# Patient Record
Sex: Male | Born: 1950 | Race: White | Hispanic: No | Marital: Married | Smoking: Never smoker
Health system: Southern US, Community
[De-identification: ages and names within clinical notes are randomized; demographics above are authoritative.]

## PROBLEM LIST (undated history)

## (undated) DIAGNOSIS — I10 Essential (primary) hypertension: Secondary | ICD-10-CM

## (undated) DIAGNOSIS — Q288 Other specified congenital malformations of circulatory system: Secondary | ICD-10-CM

## (undated) DIAGNOSIS — Z9689 Presence of other specified functional implants: Secondary | ICD-10-CM

## (undated) DIAGNOSIS — G839 Paralytic syndrome, unspecified: Secondary | ICD-10-CM

## (undated) DIAGNOSIS — K592 Neurogenic bowel, not elsewhere classified: Secondary | ICD-10-CM

## (undated) DIAGNOSIS — G894 Chronic pain syndrome: Secondary | ICD-10-CM

## (undated) DIAGNOSIS — Z978 Presence of other specified devices: Secondary | ICD-10-CM

## (undated) DIAGNOSIS — Z87442 Personal history of urinary calculi: Secondary | ICD-10-CM

## (undated) DIAGNOSIS — G709 Myoneural disorder, unspecified: Secondary | ICD-10-CM

## (undated) DIAGNOSIS — N319 Neuromuscular dysfunction of bladder, unspecified: Secondary | ICD-10-CM

## (undated) DIAGNOSIS — C61 Malignant neoplasm of prostate: Secondary | ICD-10-CM

## (undated) DIAGNOSIS — C801 Malignant (primary) neoplasm, unspecified: Secondary | ICD-10-CM

## (undated) DIAGNOSIS — Z9889 Other specified postprocedural states: Secondary | ICD-10-CM

## (undated) DIAGNOSIS — A419 Sepsis, unspecified organism: Secondary | ICD-10-CM

## (undated) DIAGNOSIS — S34109A Unspecified injury to unspecified level of lumbar spinal cord, initial encounter: Secondary | ICD-10-CM

## (undated) HISTORY — PX: COLONOSCOPY: SHX174

## (undated) HISTORY — PX: EYE SURGERY: SHX253

## (undated) HISTORY — PX: CYSTOSCOPY: SUR368

## (undated) HISTORY — PX: EXTRACORPOREAL SHOCK WAVE LITHOTRIPSY: SHX1557

## (undated) HISTORY — DX: Malignant neoplasm of prostate: C61

---

## 1976-07-21 HISTORY — PX: KNEE SURGERY: SHX244

## 2003-07-22 DIAGNOSIS — G839 Paralytic syndrome, unspecified: Secondary | ICD-10-CM

## 2003-07-22 HISTORY — PX: LUMBAR DISC SURGERY: SHX700

## 2003-07-22 HISTORY — DX: Paralytic syndrome, unspecified: G83.9

## 2004-07-08 ENCOUNTER — Ambulatory Visit: Payer: Self-pay | Admitting: Urology

## 2004-08-21 ENCOUNTER — Ambulatory Visit: Payer: Self-pay | Admitting: Specialist

## 2004-09-26 ENCOUNTER — Ambulatory Visit (HOSPITAL_COMMUNITY): Admission: RE | Admit: 2004-09-26 | Discharge: 2004-09-27 | Payer: Self-pay | Admitting: Neurosurgery

## 2004-12-01 ENCOUNTER — Observation Stay (HOSPITAL_COMMUNITY): Admission: EM | Admit: 2004-12-01 | Discharge: 2004-12-02 | Payer: Self-pay | Admitting: Emergency Medicine

## 2004-12-20 ENCOUNTER — Ambulatory Visit: Payer: Self-pay | Admitting: Urology

## 2004-12-23 ENCOUNTER — Ambulatory Visit: Payer: Self-pay | Admitting: Urology

## 2005-01-03 ENCOUNTER — Other Ambulatory Visit: Payer: Self-pay

## 2005-01-03 ENCOUNTER — Inpatient Hospital Stay: Payer: Self-pay | Admitting: Internal Medicine

## 2005-03-26 ENCOUNTER — Ambulatory Visit: Admission: RE | Admit: 2005-03-26 | Discharge: 2005-03-26 | Payer: Self-pay | Admitting: Urology

## 2008-11-06 ENCOUNTER — Ambulatory Visit: Payer: Self-pay | Admitting: Pain Medicine

## 2008-11-16 ENCOUNTER — Encounter: Payer: Self-pay | Admitting: Physician Assistant

## 2008-11-18 ENCOUNTER — Encounter: Payer: Self-pay | Admitting: Physician Assistant

## 2009-09-18 ENCOUNTER — Encounter: Payer: Self-pay | Admitting: Physical Medicine & Rehabilitation

## 2009-10-16 ENCOUNTER — Ambulatory Visit: Payer: Self-pay | Admitting: Gastroenterology

## 2009-10-19 ENCOUNTER — Encounter: Payer: Self-pay | Admitting: Physical Medicine & Rehabilitation

## 2009-11-18 ENCOUNTER — Encounter: Payer: Self-pay | Admitting: Physical Medicine & Rehabilitation

## 2010-03-15 ENCOUNTER — Encounter: Payer: Self-pay | Admitting: Physical Medicine & Rehabilitation

## 2010-03-21 ENCOUNTER — Encounter: Payer: Self-pay | Admitting: Physical Medicine & Rehabilitation

## 2010-08-27 ENCOUNTER — Encounter: Payer: Self-pay | Admitting: Physical Medicine & Rehabilitation

## 2010-09-19 ENCOUNTER — Encounter: Payer: Self-pay | Admitting: Physical Medicine & Rehabilitation

## 2010-09-26 ENCOUNTER — Encounter: Payer: Self-pay | Admitting: Physical Medicine & Rehabilitation

## 2010-10-15 ENCOUNTER — Ambulatory Visit: Payer: Self-pay

## 2010-10-20 ENCOUNTER — Encounter: Payer: Self-pay | Admitting: Physical Medicine & Rehabilitation

## 2011-12-18 ENCOUNTER — Ambulatory Visit: Payer: Self-pay | Admitting: Urology

## 2012-01-23 ENCOUNTER — Encounter: Payer: Self-pay | Admitting: General Practice

## 2012-02-24 NOTE — Telephone Encounter (Signed)
This encounter was created in error - please disregard.

## 2012-05-13 ENCOUNTER — Ambulatory Visit: Payer: Self-pay | Admitting: Anesthesiology

## 2012-05-14 LAB — URINE CULTURE

## 2012-05-17 ENCOUNTER — Ambulatory Visit: Payer: Self-pay | Admitting: Urology

## 2012-06-29 DIAGNOSIS — K592 Neurogenic bowel, not elsewhere classified: Secondary | ICD-10-CM | POA: Insufficient documentation

## 2012-06-29 DIAGNOSIS — N319 Neuromuscular dysfunction of bladder, unspecified: Secondary | ICD-10-CM | POA: Insufficient documentation

## 2012-06-29 DIAGNOSIS — S34109A Unspecified injury to unspecified level of lumbar spinal cord, initial encounter: Secondary | ICD-10-CM | POA: Insufficient documentation

## 2012-06-29 DIAGNOSIS — M62838 Other muscle spasm: Secondary | ICD-10-CM | POA: Insufficient documentation

## 2012-06-29 DIAGNOSIS — M25559 Pain in unspecified hip: Secondary | ICD-10-CM | POA: Insufficient documentation

## 2012-07-20 DIAGNOSIS — K6289 Other specified diseases of anus and rectum: Secondary | ICD-10-CM | POA: Insufficient documentation

## 2012-07-20 DIAGNOSIS — G894 Chronic pain syndrome: Secondary | ICD-10-CM | POA: Insufficient documentation

## 2012-07-20 DIAGNOSIS — M62838 Other muscle spasm: Secondary | ICD-10-CM | POA: Insufficient documentation

## 2012-12-25 DIAGNOSIS — F4542 Pain disorder with related psychological factors: Secondary | ICD-10-CM | POA: Insufficient documentation

## 2013-05-11 ENCOUNTER — Encounter: Payer: Self-pay | Admitting: Urology

## 2013-05-18 LAB — CBC WITH DIFFERENTIAL/PLATELET
Basophil #: 0.1 10*3/uL (ref 0.0–0.1)
Basophil %: 0.8 %
Eosinophil #: 0.2 10*3/uL (ref 0.0–0.7)
Monocyte %: 10 %
Neutrophil %: 52.3 %
Platelet: 162 10*3/uL (ref 150–440)

## 2013-05-21 ENCOUNTER — Encounter: Payer: Self-pay | Admitting: Urology

## 2013-09-27 ENCOUNTER — Emergency Department: Payer: Self-pay | Admitting: Emergency Medicine

## 2013-09-27 LAB — COMPREHENSIVE METABOLIC PANEL
ALBUMIN: 3.4 g/dL (ref 3.4–5.0)
ALT: 20 U/L (ref 12–78)
ANION GAP: 5 — AB (ref 7–16)
Alkaline Phosphatase: 82 U/L
BUN: 11 mg/dL (ref 7–18)
Bilirubin,Total: 0.5 mg/dL (ref 0.2–1.0)
CHLORIDE: 99 mmol/L (ref 98–107)
Calcium, Total: 8.9 mg/dL (ref 8.5–10.1)
Co2: 32 mmol/L (ref 21–32)
Creatinine: 0.88 mg/dL (ref 0.60–1.30)
EGFR (African American): >60
GLUCOSE: 96 mg/dL (ref 65–99)
Osmolality: 271 (ref 275–301)
POTASSIUM: 3.4 mmol/L — AB (ref 3.5–5.1)
SGOT(AST): 19 U/L (ref 15–37)
SODIUM: 136 mmol/L (ref 136–145)
Total Protein: 7.7 g/dL (ref 6.4–8.2)

## 2013-09-27 LAB — CBC WITH DIFFERENTIAL/PLATELET
Basophil #: 0 10*3/uL (ref 0.0–0.1)
Basophil %: 0.3 %
EOS ABS: 0.2 10*3/uL (ref 0.0–0.7)
EOS PCT: 1.2 %
HCT: 48 % (ref 40.0–52.0)
HGB: 16.3 g/dL (ref 13.0–18.0)
LYMPHS ABS: 1.7 10*3/uL (ref 1.0–3.6)
LYMPHS PCT: 11.8 %
MCH: 29.5 pg (ref 26.0–34.0)
MCHC: 34 g/dL (ref 32.0–36.0)
MCV: 87 fL (ref 80–100)
Monocyte #: 1.4 x10 3/mm — ABNORMAL HIGH (ref 0.2–1.0)
Monocyte %: 10.1 %
Neutrophil #: 10.7 10*3/uL — ABNORMAL HIGH (ref 1.4–6.5)
Neutrophil %: 76.6 %
Platelet: 182 10*3/uL (ref 150–440)
RBC: 5.54 10*6/uL (ref 4.40–5.90)
RDW: 13.5 % (ref 11.5–14.5)
WBC: 14 10*3/uL — ABNORMAL HIGH (ref 3.8–10.6)

## 2013-10-02 LAB — CULTURE, BLOOD (SINGLE)

## 2014-06-29 DIAGNOSIS — G5603 Carpal tunnel syndrome, bilateral upper limbs: Secondary | ICD-10-CM | POA: Insufficient documentation

## 2014-09-19 ENCOUNTER — Ambulatory Visit: Payer: Self-pay | Admitting: Anesthesiology

## 2014-09-26 ENCOUNTER — Ambulatory Visit: Payer: Self-pay | Admitting: Urology

## 2014-11-07 NOTE — Op Note (Signed)
PATIENT NAME:  [name], [name]:  [id number] DATE OF BIRTH:  [birth year]  DATE OF PROCEDURE:  05/17/2012  PREOPERATIVE DIAGNOSIS: Urethral stricture disease.   POSTOPERATIVE DIAGNOSIS: Urethral stricture disease.   PROCEDURE: Visual internal urethrotomy with the holmium laser.   SURGEON: Kerney Hopfensperger. Yves Dill, MD  ANESTHETIST: Dr. Ronelle Nigh  ANESTHESIA: General.   INDICATIONS: See the dictated history and physical. After informed consent the patient requests above procedure.   OPERATIVE SUMMARY: After adequate general anesthesia had been obtained, perineum was prepped and draped in the usual fashion. The 21 French cystoscope was coupled with the camera and then visually advanced into the distal urethra. Scope could only be advanced to the mid pendulous urethra at which point stricture was encountered. Stricture appeared to proceed to the bulbar urethra. At this point, a 0.035 guidewire was passed through the stricture and curled into the bladder. The cystoscope was then removed and then advanced along side  the guidewire and the 500 micron holmium laser fiber was introduced through the scope. The stricture was then incised with the laser at the 6:00 position, eventually allowing entry into the bladder. Bladder was thoroughly inspected. Both ureteral orifices were identified and had clear efflux. No bladder mucosal lesions were identified. At this point the cystoscope was removed. An [place] catheter was advanced over the guidewire and positioned in the bladder. Guidewire was then removed. Catheter had clear drainage. A B and O suppository was placed. Procedure was then terminated and the patient was transferred to the recovery room in stable condition.   ____________________________ [name]. Yves Dill, MD mrw:cms D: 05/17/2012 09:01:01 ET T: 05/17/2012 09:18:45 ET  JOB#: [id number] cc: [name]. Yves Dill, MD, <Dictator>  Royston Cowper MD ELECTRONICALLY SIGNED 05/17/2012 15:00

## 2014-11-07 NOTE — H&P (Signed)
PATIENT NAME:  [name], [name] MR#:  [id number] DATE OF BIRTH:  1950-12-11  DATE OF ADMISSION:  05/17/2012  CHIEF COMPLAINT: Difficulty performing self-catheterization.   HISTORY OF PRESENT ILLNESS: [name] is a 64 year old white male paraplegic with neurogenic bladder requiring intermittent self catheter 4 times daily. In the past few months he has met some resistance with catheter and comes in now for cysto with visual internal urethrotomy. He also has a history of recent frequent urinary tract infections and just completed a course of Cipro.   ALLERGIES: Patient was allergic to penicillin, doxycycline, clindamycin and Septra.   CURRENT MEDICATIONS:  1. Detrol LA. 2. Methenamine. 3. Nucynta. 4. Dilaudid.  5. MiraLax. 6. Neurontin.  7. Valsartan. 8. HCTZ. 9. Cipro. 10. Testosterone cream.   PAST SURGICAL HISTORY:  1. Lithotripsy. 2. Knee surgery. 3. Hand surgery. 4. Placement of Medtronic pain pump.  5. 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   SOCIAL HISTORY: He denied tobacco or alcohol use.   FAMILY HISTORY: Noncontributory.   PAST AND CURRENT MEDICAL CONDITIONS:  1. 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 2. Chronic back pain.  3. Urinary retention. 4. Recurrent urinary tract infections.  5. Hypertension.  6. Hypogonadism.   REVIEW OF SYSTEMS: Patient denied chest pain, shortness of breath, diabetes or stroke.   PHYSICAL EXAMINATION:  GENERAL: Well-nourished white male in no acute distress.   HEENT: Sclerae were clear. Pupils are equally round, reactive to light. Extraocular movements were intact.   NECK: Supple. No palpable cervical adenopathy.   LUNGS: Clear to auscultation.    CARDIOVASCULAR: Regular rhythm and rate without audible murmurs.   ABDOMEN: Soft, nontender abdomen.   GENITOURINARY: Circumcised. Testes atrophic.   RECTAL: Deferred.    NEUROMUSCULAR: Exam was consistent with paraplegia. Patient was alert and oriented x3.   IMPRESSION:  1. Probable urethral stricture disease.  2. Neurogenic bladder. 3. Paraplegia. 4. Recurrent UTIs.     PLAN: Cystoscopy with internal urethrotomy using the Holmium laser.    ____________________________ [name]. Yves Dill, MD mrw:cms D: 05/11/2012 14:06:32 ET T: 05/11/2012 14:29:05 ET JOB#: [id number]  cc: [name]. Yves Dill, MD, <Dictator>  Royston Cowper MD ELECTRONICALLY SIGNED 05/12/2012 9:54

## 2014-11-11 NOTE — Op Note (Signed)
PATIENT NAME:  [name], [name] MR#:  [id number] DATE OF BIRTH:  [birth year]  DATE OF PROCEDURE:  09/27/2013  PREOPERATIVE DIAGNOSIS: Left lateral upper back abscess.   POSTOPERATIVE DIAGNOSIS:  Left lateral upper back abscess.   PROCEDURE PERFORMED: Incision, drainage and debridement of left back abscess.   SURGEON: Hortencia Conradi, MD  ASSISTANT: None.   ANESTHESIA: Lidocaine 1% with epinephrine 10 mL   FINDINGS: Pus.   SPECIMENS: None.   DESCRIPTION OF PROCEDURE: With the patient in the supine position, the area was sterilely prepped and draped with Betadine solution. Timeout was observed. Local anesthesia was infiltrated around the abscess cavity. The abscess cavity had already drained thorugh a portion of necrotic skin prior to my arrival. As such, cultures were not obtained.   A cruciate incision overlying the area of existing necrotic skin was fashioned with a #11 blade and a large amount of creamy pus was extruded. The cavity was then debrided utilizing multiple 4 x 4 gauzes and finger fracture technique. The wound was then irrigated with Betadine-impregnated saline and packed open utilizing 0.25 inch iodform gauze followed by 4 x 4's and ABD dressings. The patient tolerated the procedure without immediate complication.    ____________________________ Jeannette How Marina Gravel, MD mab:mk D: 09/27/2013 23:03:58 ET T: 09/27/2013 23:16:49 ET JOB#: [id number]  cc: Elta Guadeloupe A. Marina Gravel, MD, <Dictator> Richard R. Koleen Nimrod, MD Koi Zangara Bettina Gavia MD ELECTRONICALLY SIGNED 09/28/2013 7:27

## 2014-11-19 NOTE — Op Note (Signed)
PATIENT NAME:  [name], [name] MR#:  [id number] DATE OF BIRTH:  10/31/1950  DATE OF PROCEDURE:  09/26/2014  PREOPERATIVE DIAGNOSES:  1.  Urethral stricture disease.  2.  Neurogenic bladder.   POSTOPERATIVE DIAGNOSES:  1.  Urethral stricture disease.  2.  Neurogenic bladder. 3.  Bladder stone.   PROCEDURES:  1.  Cystoscopy with internal urethrotomy using the holmium laser.  2.  Litholapaxy of bladder stone with the holmium laser.  3.  Foley catheter placement.   SURGEON: Gabreil [place]. Yves Dill, MD  ANESTHETISTSheral Flow F. [place] Service, MD   ANESTHETIC METHOD: General.   INDICATIONS: See the dictated history and physical. After informed consent, the patient requests the above procedure.   OPERATIVE SUMMARY: After adequate general anesthesia had been obtained, the patient was placed into dorsal lithotomy position and the perineum was prepped and draped in the usual fashion. The 21 French cystoscope was coupled with a camera and then placed into the urethra. The scope could only be advanced to the distal pendulous urethra due to stricture. A 0.035 guidewire was then passed through the stricture and curled into the bladder. The cystoscope was then removed and advanced alongside the guidewire up to the stricture. The 965 micron holmium laser fiber was introduced through the scope and stricture was incised at the 12 o'clock position. Length of the stricture was approximately 3 cm. Eventually the bladder was entered. The bladder was thoroughly inspected. The bladder was heavily trabeculated. No bladder tumors were identified. The patient had a 20 mm bladder stone present. The holmium laser fiber was then reintroduced through the scope and the stone was disintegrated. The fragments were then irrigated out of the bladder. At this point, the cystoscope was removed taking care to leave the guidewire in position. A 20 [place] [employer] catheter was then advanced over the guidewire and positioned in the  bladder. Guidewire was then removed. The catheter had clear drainage. Then 10 mL of viscous Xylocaine was instilled within the urethra alongside the catheter. A B and O suppository was placed. The procedure was then terminated and the patient was transferred to the recovery room in stable condition     ____________________________ [name]. Yves Dill, MD mrw:bm D: 09/26/2014 16:27:46 ET T: 09/27/2014 01:20:11 ET JOB#: [id number]  cc: [name]. Yves Dill, MD, <Dictator> Royston Cowper MD ELECTRONICALLY SIGNED 09/28/2014 10:01

## 2014-11-19 NOTE — H&P (Signed)
PATIENT NAME:  [name], [name] MR#:  [id number] DATE OF BIRTH:  02/05/1951  DATE OF ADMISSION:  09/26/2014  CHIEF COMPLAINT: Difficulty performing self-catheterization.   HISTORY OF PRESENT ILLNESS: [name] is a 64 year old white male paraplegic with neurogenic bladder who performs intermittent clean self-catheterization 4 times daily. In the past few months he has had some difficulty placing the catheters due to resistance in the mid urethra. He comes in now for cystoscopy with internal urethrotomy using the holmium laser. He had had similar problems back in 2013 due to a urethral stricture.   ALLERGIES: PENICILLIN, DOXYCYCLINE, CLINDAMYCIN AND SEPTRA.   CURRENT MEDICATIONS: Include gabapentin, methenamine, Nucynta, tolterodine, vitamin C, Myrbetriq, losartan, Dilaudid, baclofen, testosterone cream, omega-3 fish oil, Vision Guard bladder shield.  PAST SURGICAL HISTORY:  1.  Lithotripsy for kidney stones. 2.  Knee surgery.  3.  Hand surgery. 4.  Placement of Medtronic pain pump. 5.  L5 micro disk surgery in 2006 with embolization of lumbar spine AV malformation in 2006.   SOCIAL HISTORY: The patient denied tobacco or alcohol use.   FAMILY HISTORY: Noncontributory.   PAST AND CURRENT MEDICAL CONDITIONS:  1.  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  2.  Chronic back pain.  3.  Urinary retention. 4.  Recurrent urinary tract infections.  5.  Hypertension.  6.  Hypogonadism.    REVIEW OF SYSTEMS: The patient denies heart disease, chest pain, shortness of breath, diabetes, or stroke.   PHYSICAL EXAMINATION: GENERAL: Well-nourished white male in no acute distress.  HEENT: Sclerae were clear. Pupils were equally round, reactive to light and accommodation. Extraocular movements were intact.  NECK: Supple. No palpable cervical adenopathy.  LUNGS: Clear to auscultation.  CARDIOVASCULAR: Regular rhythm and rate without audible murmurs.  ABDOMEN: Soft, nontender abdomen.   GENITOURINARY: Circumcised. Testes atrophic, 16 mL in size each.  RECTAL: Deferred.  NEUROMUSCULAR: Consistent with T12 paraplegia. The patient was alert and oriented x3.   IMPRESSION:  1.  Probable recurrent urethral stricture disease.  2.  Neurogenic bladder. 3.  Paraplegia.  4.  Hypergonadism.  5.  Recurrent urinary tract infections.   PLAN: Cystoscopy with internal urethrotomy using the holmium laser.   ____________________________ [name]. Yves Dill, MD mrw:sb D: 09/19/2014 08:12:25 ET T: 09/19/2014 08:38:29 ET JOB#: [id number]  cc: [name]. Yves Dill, MD, <Dictator> Royston Cowper MD ELECTRONICALLY SIGNED 09/19/2014 11:38

## 2016-01-29 ENCOUNTER — Other Ambulatory Visit: Payer: Self-pay | Admitting: Urology

## 2016-01-29 DIAGNOSIS — R3129 Other microscopic hematuria: Secondary | ICD-10-CM

## 2016-02-07 ENCOUNTER — Ambulatory Visit
Admission: RE | Admit: 2016-02-07 | Discharge: 2016-02-07 | Disposition: A | Discharge status: Home / Self Care | Payer: Medicare Other | Source: Ambulatory Visit | Attending: Urology | Admitting: Urology

## 2016-02-07 DIAGNOSIS — I700 Atherosclerosis of aorta: Secondary | ICD-10-CM | POA: Insufficient documentation

## 2016-02-07 DIAGNOSIS — N200 Calculus of kidney: Secondary | ICD-10-CM | POA: Insufficient documentation

## 2016-02-07 DIAGNOSIS — K760 Fatty (change of) liver, not elsewhere classified: Secondary | ICD-10-CM | POA: Insufficient documentation

## 2016-02-07 DIAGNOSIS — N289 Disorder of kidney and ureter, unspecified: Secondary | ICD-10-CM | POA: Diagnosis not present

## 2016-02-07 DIAGNOSIS — R3129 Other microscopic hematuria: Secondary | ICD-10-CM

## 2016-02-07 MED ORDER — IOPAMIDOL (ISOVUE-300) INJECTION 61%
100.0000 mL | Freq: Once | INTRAVENOUS | Status: AC | PRN
  Administered 2016-02-07: 125 mL via INTRAVENOUS

## 2016-04-07 DIAGNOSIS — I10 Essential (primary) hypertension: Secondary | ICD-10-CM | POA: Insufficient documentation

## 2016-09-02 ENCOUNTER — Other Ambulatory Visit: Payer: Self-pay | Admitting: Urology

## 2016-09-02 DIAGNOSIS — C61 Malignant neoplasm of prostate: Secondary | ICD-10-CM

## 2016-09-08 ENCOUNTER — Encounter
Admission: RE | Admit: 2016-09-08 | Discharge: 2016-09-08 | Disposition: A | Discharge status: Home / Self Care | Payer: Medicare Other | Source: Ambulatory Visit | Attending: Urology | Admitting: Urology

## 2016-09-08 ENCOUNTER — Ambulatory Visit
Admission: RE | Admit: 2016-09-08 | Discharge: 2016-09-08 | Disposition: A | Discharge status: Home / Self Care | Payer: Medicare Other | Source: Ambulatory Visit | Attending: Urology | Admitting: Urology

## 2016-09-08 ENCOUNTER — Other Ambulatory Visit: Payer: Medicare Other

## 2016-09-08 DIAGNOSIS — N200 Calculus of kidney: Secondary | ICD-10-CM | POA: Insufficient documentation

## 2016-09-08 DIAGNOSIS — C61 Malignant neoplasm of prostate: Secondary | ICD-10-CM | POA: Diagnosis present

## 2016-09-08 HISTORY — DX: Malignant (primary) neoplasm, unspecified: C80.1

## 2016-09-08 LAB — POCT I-STAT CREATININE: CREATININE: 0.8 mg/dL (ref 0.61–1.24)

## 2016-09-08 MED ORDER — IOPAMIDOL (ISOVUE-300) INJECTION 61%
100.0000 mL | Freq: Once | INTRAVENOUS | Status: AC | PRN
  Administered 2016-09-08: 100 mL via INTRAVENOUS

## 2016-09-08 MED ORDER — TECHNETIUM TC 99M MEDRONATE IV KIT
25.0000 | PACK | Freq: Once | INTRAVENOUS | Status: AC | PRN
  Administered 2016-09-08: 21.96 via INTRAVENOUS

## 2016-09-11 ENCOUNTER — Other Ambulatory Visit: Payer: Self-pay | Admitting: Orthopedic Surgery

## 2016-09-11 DIAGNOSIS — C61 Malignant neoplasm of prostate: Secondary | ICD-10-CM

## 2016-09-17 ENCOUNTER — Ambulatory Visit
Admission: RE | Admit: 2016-09-17 | Discharge: 2016-09-17 | Disposition: A | Discharge status: Home / Self Care | Payer: Medicare Other | Source: Ambulatory Visit | Attending: Urology | Admitting: Urology

## 2016-09-17 ENCOUNTER — Other Ambulatory Visit: Payer: Self-pay | Admitting: Orthopedic Surgery

## 2016-09-17 ENCOUNTER — Ambulatory Visit
Admission: RE | Admit: 2016-09-17 | Discharge: 2016-09-17 | Disposition: A | Discharge status: Home / Self Care | Payer: Medicare Other | Source: Ambulatory Visit | Attending: Orthopedic Surgery | Admitting: Orthopedic Surgery

## 2016-09-17 DIAGNOSIS — M5136 Other intervertebral disc degeneration, lumbar region: Secondary | ICD-10-CM | POA: Insufficient documentation

## 2016-09-17 DIAGNOSIS — C61 Malignant neoplasm of prostate: Secondary | ICD-10-CM | POA: Insufficient documentation

## 2016-09-17 DIAGNOSIS — R938 Abnormal findings on diagnostic imaging of other specified body structures: Secondary | ICD-10-CM | POA: Diagnosis not present

## 2016-09-24 ENCOUNTER — Other Ambulatory Visit: Payer: Self-pay | Admitting: Orthopedic Surgery

## 2016-09-24 DIAGNOSIS — C7951 Secondary malignant neoplasm of bone: Principal | ICD-10-CM

## 2016-09-24 DIAGNOSIS — R948 Abnormal results of function studies of other organs and systems: Secondary | ICD-10-CM

## 2016-09-24 DIAGNOSIS — C61 Malignant neoplasm of prostate: Secondary | ICD-10-CM

## 2016-10-06 ENCOUNTER — Ambulatory Visit
Admission: RE | Admit: 2016-10-06 | Discharge: 2016-10-06 | Disposition: A | Discharge status: Home / Self Care | Payer: Medicare Other | Source: Ambulatory Visit | Attending: Orthopedic Surgery | Admitting: Orthopedic Surgery

## 2016-10-06 ENCOUNTER — Inpatient Hospital Stay: Admission: RE | Admit: 2016-10-06 | Payer: Medicare Other | Source: Ambulatory Visit

## 2016-10-06 DIAGNOSIS — Z993 Dependence on wheelchair: Secondary | ICD-10-CM | POA: Insufficient documentation

## 2016-10-06 DIAGNOSIS — C61 Malignant neoplasm of prostate: Secondary | ICD-10-CM

## 2016-10-06 DIAGNOSIS — C7951 Secondary malignant neoplasm of bone: Secondary | ICD-10-CM | POA: Diagnosis present

## 2016-10-06 DIAGNOSIS — R948 Abnormal results of function studies of other organs and systems: Secondary | ICD-10-CM | POA: Diagnosis present

## 2017-01-19 ENCOUNTER — Ambulatory Visit
Admission: RE | Admit: 2017-01-19 | Discharge: 2017-01-19 | Disposition: A | Discharge status: Home / Self Care | Payer: Medicare Other | Source: Ambulatory Visit | Attending: Internal Medicine | Admitting: Internal Medicine

## 2017-01-19 ENCOUNTER — Other Ambulatory Visit: Payer: Self-pay | Admitting: Internal Medicine

## 2017-01-19 DIAGNOSIS — G8220 Paraplegia, unspecified: Secondary | ICD-10-CM

## 2017-01-19 DIAGNOSIS — R600 Localized edema: Secondary | ICD-10-CM

## 2017-01-19 DIAGNOSIS — C61 Malignant neoplasm of prostate: Secondary | ICD-10-CM | POA: Insufficient documentation

## 2017-02-05 ENCOUNTER — Inpatient Hospital Stay: Payer: Medicare Other | Attending: Oncology | Admitting: Oncology

## 2017-02-05 ENCOUNTER — Inpatient Hospital Stay: Payer: Medicare Other

## 2017-02-05 ENCOUNTER — Encounter: Payer: Self-pay | Admitting: Oncology

## 2017-02-05 VITALS — BP 137/74 | HR 86 | Temp 98.2°F | Resp 16 | Ht 73.0 in | Wt 240.0 lb

## 2017-02-05 DIAGNOSIS — E785 Hyperlipidemia, unspecified: Secondary | ICD-10-CM | POA: Diagnosis not present

## 2017-02-05 DIAGNOSIS — D751 Secondary polycythemia: Secondary | ICD-10-CM | POA: Insufficient documentation

## 2017-02-05 DIAGNOSIS — K6289 Other specified diseases of anus and rectum: Secondary | ICD-10-CM

## 2017-02-05 DIAGNOSIS — R531 Weakness: Secondary | ICD-10-CM | POA: Diagnosis not present

## 2017-02-05 DIAGNOSIS — R5383 Other fatigue: Secondary | ICD-10-CM

## 2017-02-05 DIAGNOSIS — D696 Thrombocytopenia, unspecified: Secondary | ICD-10-CM | POA: Diagnosis not present

## 2017-02-05 DIAGNOSIS — Z87442 Personal history of urinary calculi: Secondary | ICD-10-CM

## 2017-02-05 DIAGNOSIS — I10 Essential (primary) hypertension: Secondary | ICD-10-CM | POA: Diagnosis not present

## 2017-02-05 DIAGNOSIS — Z79899 Other long term (current) drug therapy: Secondary | ICD-10-CM | POA: Diagnosis not present

## 2017-02-05 DIAGNOSIS — C61 Malignant neoplasm of prostate: Secondary | ICD-10-CM | POA: Diagnosis not present

## 2017-02-05 LAB — CBC WITH DIFFERENTIAL/PLATELET
BASOS PCT: 1 %
Basophils Absolute: 0 10*3/uL (ref 0–0.1)
EOS ABS: 0.2 10*3/uL (ref 0–0.7)
Eosinophils Relative: 3 %
HCT: 40.6 % (ref 40.0–52.0)
Hemoglobin: 14.7 g/dL (ref 13.0–18.0)
LYMPHS ABS: 1.7 10*3/uL (ref 1.0–3.6)
Lymphocytes Relative: 29 %
MCH: 32.5 pg (ref 26.0–34.0)
MCHC: 36.2 g/dL — AB (ref 32.0–36.0)
MCV: 89.9 fL (ref 80.0–100.0)
MONO ABS: 0.6 10*3/uL (ref 0.2–1.0)
Monocytes Relative: 10 %
Neutro Abs: 3.2 10*3/uL (ref 1.4–6.5)
Neutrophils Relative %: 57 %
Platelets: 156 10*3/uL (ref 150–440)
RBC: 4.52 MIL/uL (ref 4.40–5.90)
RDW: 12.6 % (ref 11.5–14.5)
WBC: 5.7 10*3/uL (ref 3.8–10.6)

## 2017-02-05 LAB — COMPREHENSIVE METABOLIC PANEL
ALBUMIN: 4.3 g/dL (ref 3.5–5.0)
ALT: 26 U/L (ref 17–63)
ANION GAP: 6 (ref 5–15)
AST: 21 U/L (ref 15–41)
Alkaline Phosphatase: 62 U/L (ref 38–126)
BILIRUBIN TOTAL: 0.7 mg/dL (ref 0.3–1.2)
BUN: 17 mg/dL (ref 6–20)
CALCIUM: 9.6 mg/dL (ref 8.9–10.3)
CO2: 33 mmol/L — ABNORMAL HIGH (ref 22–32)
Chloride: 99 mmol/L — ABNORMAL LOW (ref 101–111)
Creatinine, Ser: 0.78 mg/dL (ref 0.61–1.24)
GFR calc non Af Amer: >60 mL/min (ref >60)
Glucose, Bld: 116 mg/dL — ABNORMAL HIGH (ref 65–99)
POTASSIUM: 4.4 mmol/L (ref 3.5–5.1)
Sodium: 138 mmol/L (ref 135–145)
TOTAL PROTEIN: 7.4 g/dL (ref 6.5–8.1)

## 2017-02-05 NOTE — Progress Notes (Signed)
Patient here today for intial visit. 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 He has chronic severe rectal pain and is being treated by a pain clinic. He has an implanted pump for pain management.

## 2017-02-05 NOTE — Progress Notes (Signed)
Hematology/Oncology Consult note [facility] Telephone:([phone] [phone]  Patient Care Team: [name], MD as PCP - General (Internal Medicine)   Name of the patient: [name]  [id number]  27-Feb-1951    Reason for referral- polycythemia   Referring physician- Dr. Ginette Pitman  Date of visit: 02/05/17   History of presenting illness- Patient is a 66 year old male with a possible medical history significant for diet controlled hypertension, hyperlipidemia and history of kidney stones. He has been referred to [place] for evaluation and management of polycythemia. Recent CBC from 01/12/2017 showed H&H of 15.5/44, white count of 5.1 and platelet count of 128. Looking back at his prior CBCs,  his hemoglobin was 18.4 and 19.1 respectively he also has mild chronic thrombocytopenia and his platelet count in June 2017 was 146.  Patient was on testosterone replacement therapy for 2 years or more until may 2018 when he stopped it after he was diagnosed with prostate cancer. Given his medical problems he has been initiated on ADT for the same as anon surgical option. He had back surgery years ago and was complicated by b/l LE paralysis waist down since. He has chronic rectal pain and has a spinal cord stimulator and pain pump for the same. Reports that he has been feeling weaker since last few months to the point that he cannot stand even for few minutes that he was able to do before. He is a lifelong non smoker. No known lung disease. Reports snoring at night but gets a restful sleep  ECOG PS- 2  Pain scale- 4   Review of systems- Review of Systems  Constitutional: Positive for malaise/fatigue. Negative for chills, fever and weight loss.  HENT: Negative for congestion, ear discharge and nosebleeds.   Eyes: Negative for blurred vision.  Respiratory: Negative for cough, hemoptysis, sputum production, shortness of breath and wheezing.   Cardiovascular:  Negative for chest pain, palpitations, orthopnea and claudication.  Gastrointestinal: Negative for abdominal pain, blood in stool, constipation, diarrhea, heartburn, melena, nausea and vomiting.  Genitourinary: Negative for dysuria, flank pain, frequency, hematuria and urgency.  Musculoskeletal: Negative for back pain, joint pain and myalgias.  Skin: Negative for rash.  Neurological: Positive for weakness. Negative for dizziness, tingling, focal weakness, seizures and headaches.  Endo/Heme/Allergies: Does not bruise/bleed easily.  Psychiatric/Behavioral: Negative for depression and suicidal ideas. The patient does not have insomnia.     Allergies  Allergen Reactions  . Doxycycline Rash  . Penicillins Rash    There are no active problems to display for this patient.    Past Medical History:  Diagnosis Date  . Cancer [facility])    prostate  . Prostate cancer ([place])      No past surgical history on file.  Social History   Social History  . Marital status: Married    Spouse name: N/A  . Number of children: N/A  . Years of education: N/A   Occupational History  . Not on file.   Social History Main Topics  . Smoking status: Never Smoker  . Smokeless tobacco: Former Systems developer  . Alcohol use No  . Drug use: No  . Sexual activity: Not on file   Other Topics Concern  . Not on file   Social History Narrative  . No narrative on file     History reviewed. No pertinent family history.   Current Outpatient Prescriptions:  .  baclofen (LIORESAL) 10 MG tablet, Take by mouth., Disp: , Rfl:  .  gabapentin (NEURONTIN)  800 MG tablet, Take by mouth., Disp: , Rfl:  .  HYDROmorphone (DILAUDID) 2 MG tablet, Take by mouth., Disp: , Rfl:  .  losartan (COZAAR) 100 MG tablet, TAKE ONE TABLET EVERY DAY, Disp: , Rfl:  .  methenamine (HIPREX) 1 g tablet, four times daily, Disp: , Rfl:  .  mirabegron ER (MYRBETRIQ) 50 MG TB24 tablet, Take by mouth., Disp: , Rfl:  .  oxybutynin (DITROPAN-XL) 10  MG 24 hr tablet, Take by mouth., Disp: , Rfl:  .  polyethylene glycol powder (GLYCOLAX/MIRALAX) powder, Take by mouth., Disp: , Rfl:  .  senna-docusate (SENOKOT-S) 8.6-50 MG tablet, Take by mouth., Disp: , Rfl:  .  UNABLE TO FIND, Med Name: HYDROMORPHONE HCL IN 0.9% NACL (4 HOUR LIMIT) Baclofen and Dilaudid, Disp: , Rfl:  .  vitamin C (ASCORBIC ACID) 500 MG tablet, Take by mouth., Disp: , Rfl:    Physical exam:  Vitals:   02/05/17 1438  BP: 137/74  Pulse: 86  Resp: 16  Temp: 98.2 F (36.8 C)  TempSrc: Tympanic  SpO2: 95%  Weight: 240 lb (108.9 kg)  Height: 6\' 1"  (1.854 m)   Physical Exam  Constitutional: He is oriented to person, place, and time.  Patient is obese and in no acute distress  HENT:  Head: Normocephalic and atraumatic.  Eyes: Pupils are equal, round, and reactive to light. EOM are normal.  Neck: Normal range of motion.  Cardiovascular: Normal rate, regular rhythm and normal heart sounds.   Pulmonary/Chest: Effort normal and breath sounds normal.  Abdominal: Soft. Bowel sounds are normal.  Neurological: He is alert and oriented to person, place, and time.  B/l LE paralysis  Skin: Skin is warm and dry.       CMP Latest Ref Rng & Units 09/08/2016  Glucose 65 - 99 mg/dL -  BUN 7 - 18 mg/dL -  Creatinine 0.61 - 1.24 mg/dL 0.80  Sodium 136 - 145 mmol/L -  Potassium 3.5 - 5.1 mmol/L -  Chloride 98 - 107 mmol/L -  CO2 21 - 32 mmol/L -  Calcium 8.5 - 10.1 mg/dL -  Total Protein 6.4 - 8.2 g/dL -  Total Bilirubin 0.2 - 1.0 mg/dL -  Alkaline Phos Unit/L -  AST 15 - 37 Unit/L -  ALT 12 - 78 U/L -   CBC Latest Ref Rng & Units 09/27/2013  WBC 3.8 - 10.6 x10 3/mm 3 14.0(H)  Hemoglobin 13.0 - 18.0 g/dL 16.3  Hematocrit 40.0 - 52.0 % 48.0  Platelets 150 - 440 x10 3/mm 3 182    No images are attached to the encounter.  US Venous Img Lower Bilateral  Result Date: 01/19/2017 CLINICAL DATA:  Lower extremity edema EXAM: BILATERAL LOWER EXTREMITY VENOUS DUPLEX  ULTRASOUND TECHNIQUE: Gray-scale sonography with graded compression, as well as color Doppler and duplex ultrasound were performed to evaluate the lower extremity deep venous systems from the level of the common femoral vein and including the common femoral, femoral, profunda femoral, popliteal and calf veins including the posterior tibial, peroneal and gastrocnemius veins when visible. The superficial great saphenous vein was also interrogated. Spectral Doppler was utilized to evaluate flow at rest and with distal augmentation maneuvers in the common femoral, femoral and popliteal veins. COMPARISON:  None. FINDINGS: RIGHT LOWER EXTREMITY Common Femoral Vein: No evidence of thrombus. Normal compressibility, respiratory phasicity and response to augmentation. Saphenofemoral Junction: No evidence of thrombus. Normal compressibility and flow on color Doppler imaging. Profunda Femoral Vein: No evidence of thrombus.  Normal compressibility and flow on color Doppler imaging. Femoral Vein: No evidence of thrombus. Normal compressibility, respiratory phasicity and response to augmentation. Popliteal Vein: No evidence of thrombus. Normal compressibility, respiratory phasicity and response to augmentation. Calf Veins: No evidence of thrombus. Normal compressibility and flow on color Doppler imaging. Superficial Great Saphenous Vein: No evidence of thrombus. Normal compressibility and flow on color Doppler imaging. Venous Reflux:  None. Other Findings:  None. LEFT LOWER EXTREMITY Common Femoral Vein: No evidence of thrombus. Normal compressibility, respiratory phasicity and response to augmentation. Saphenofemoral Junction: No evidence of thrombus. Normal compressibility and flow on color Doppler imaging. Profunda Femoral Vein: No evidence of thrombus. Normal compressibility and flow on color Doppler imaging. Femoral Vein: No evidence of thrombus. Normal compressibility, respiratory phasicity and response to augmentation.  Popliteal Vein: No evidence of thrombus. Normal compressibility, respiratory phasicity and response to augmentation. Calf Veins: No evidence of thrombus. Normal compressibility and flow on color Doppler imaging. Superficial Great Saphenous Vein: No evidence of thrombus. Normal compressibility and flow on color Doppler imaging. Venous Reflux:  None. Other Findings:  None. IMPRESSION: No evidence of deep venous thrombosis in either lower extremity. Electronically Signed   By: Lowella Grip III M.D.   On: 01/19/2017 10:53    Assessment and plan- Patient is a 66 y.o. 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 I therefore suspect that his polycythemia is secondary to testosterone replacement therapy. Given that he has prostate cancer he is not going to be going back on testosterone. 3 I will do a complete polycythemia workup including CBC with differential, CMP, jak 2, EPO level, chest x-ray. I will see the patient back in 2 weeks' time to discuss his results of blood work and further management   Thank you for this kind referral and the opportunity to participate in the care of this patient   Visit Diagnosis 1. Polycythemia     Dr. Randa Evens, MD, MPH [place] [facility] Pager- [phone] 02/05/2017

## 2017-02-06 LAB — ERYTHROPOIETIN: ERYTHROPOIETIN: 11.2 m[IU]/mL (ref 2.6–18.5)

## 2017-02-18 ENCOUNTER — Ambulatory Visit
Admission: RE | Admit: 2017-02-18 | Discharge: 2017-02-18 | Disposition: A | Discharge status: Home / Self Care | Payer: Medicare Other | Source: Ambulatory Visit | Attending: Oncology | Admitting: Oncology

## 2017-02-18 DIAGNOSIS — D751 Secondary polycythemia: Secondary | ICD-10-CM

## 2017-02-18 LAB — JAK2 GENOTYPR

## 2017-02-19 ENCOUNTER — Inpatient Hospital Stay: Payer: Medicare Other | Attending: Oncology | Admitting: Oncology

## 2017-02-19 VITALS — BP 122/75 | HR 87 | Temp 98.1°F | Resp 18 | Wt 245.0 lb

## 2017-02-19 DIAGNOSIS — D751 Secondary polycythemia: Secondary | ICD-10-CM | POA: Insufficient documentation

## 2017-02-19 DIAGNOSIS — R531 Weakness: Secondary | ICD-10-CM | POA: Insufficient documentation

## 2017-02-19 DIAGNOSIS — C61 Malignant neoplasm of prostate: Secondary | ICD-10-CM | POA: Diagnosis not present

## 2017-02-19 NOTE — Progress Notes (Signed)
Patient states he has no energy.  Presents today in wheelchair, accompanied by his wife.

## 2017-02-19 NOTE — Progress Notes (Signed)
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 He has been referred to [place] for evaluation and management of polycythemia. Recent CBC from 01/12/2017 showed H&H of 15.5/44, white count of 5.1 and platelet count of 128. Looking back at his prior CBCs,  his hemoglobin was 18.4 and 19.1 respectively he also has mild chronic thrombocytopenia and his platelet count in June 2017 was 146.  Patient was on testosterone replacement therapy for 2 years or more until may 2018 when he stopped it after he was diagnosed with prostate cancer. Given his medical problems he has been initiated on ADT for the same as a non surgical option. He had back surgery years ago and was complicated by b/l LE paralysis waist down since. He has chronic rectal pain and has a spinal cord stimulator and pain pump for the same. Reports that he has been feeling weaker since last few months to the point that he cannot stand even for few minutes that he was able to do before. He is a lifelong non smoker. No known lung disease. Reports snoring at night but gets a restful sleep  CBC from 02/05/2017 showed white count of 5.7, H&H of 14.7/40.6 and a platelet count of 156. EPO level was normal and jak 2 testing was negative. CMP showed normal LFTs.CXR normal   Interval history- legs still feel weak. Denies other complaints  ECOG PS- 2 Pain scale- 8- chronic back pain   Review of systems- Review of  Systems  Constitutional: Positive for malaise/fatigue. Negative for chills, fever and weight loss.  HENT: Negative for congestion, ear discharge and nosebleeds.   Eyes: Negative for blurred vision.  Respiratory: Negative for cough, hemoptysis, sputum production, shortness of breath and wheezing.   Cardiovascular: Negative for chest pain, palpitations, orthopnea and claudication.  Gastrointestinal: Negative for abdominal pain, blood in stool, constipation, diarrhea, heartburn, melena, nausea and vomiting.  Genitourinary: Negative for dysuria, flank pain, frequency, hematuria and urgency.  Musculoskeletal: Negative for back pain, joint pain and myalgias.  Skin: Negative for rash.  Neurological: Positive for weakness. Negative for dizziness, tingling, focal weakness, seizures and headaches.  Endo/Heme/Allergies: Does not bruise/bleed easily.  Psychiatric/Behavioral: Negative for depression and suicidal ideas. The patient does not have insomnia.       Allergies  Allergen Reactions  . Doxycycline Rash  . Penicillins Rash     Past Medical History:  Diagnosis Date  . Cancer [facility])    prostate  . Prostate cancer [facility])      Past Surgical History:  Procedure Laterality Date  . LUMBAR [place] SURGERY  2005    Social History   Social History  . Marital status: Married    Spouse name: N/A  . Number of children: N/A  . Years of education: N/A   Occupational History  . Not on file.   Social History Main Topics  . Smoking status: Never Smoker  . Smokeless tobacco: Former Systems developer  . Alcohol use No  . Drug use: No  . Sexual activity: Not on file  Other Topics Concern  . Not on file   Social History Narrative  . No narrative on file    No family history on file.   Current Outpatient Prescriptions:  .  baclofen (LIORESAL) 10 MG tablet, Take by mouth., Disp: , Rfl:  .  gabapentin (NEURONTIN) 800 MG tablet, Take by mouth., Disp: , Rfl:  .  HYDROmorphone (DILAUDID) 2 MG tablet,  Take by mouth., Disp: , Rfl:  .  losartan (COZAAR) 100 MG tablet, TAKE ONE TABLET EVERY DAY, Disp: , Rfl:  .  methenamine (HIPREX) 1 g tablet, four times daily, Disp: , Rfl:  .  mirabegron ER (MYRBETRIQ) 50 MG TB24 tablet, Take by mouth., Disp: , Rfl:  .  oxybutynin (DITROPAN-XL) 10 MG 24 hr tablet, Take by mouth., Disp: , Rfl:  .  polyethylene glycol powder (GLYCOLAX/MIRALAX) powder, Take by mouth., Disp: , Rfl:  .  senna-docusate (SENOKOT-S) 8.6-50 MG tablet, Take by mouth., Disp: , Rfl:  .  UNABLE TO FIND, Med Name: HYDROMORPHONE HCL IN 0.9% NACL (4 HOUR LIMIT) Baclofen and Dilaudid, Disp: , Rfl:  .  vitamin C (ASCORBIC ACID) 500 MG tablet, Take by mouth., Disp: , Rfl:   Physical exam:  Vitals:   02/19/17 1403  BP: 122/75  Pulse: 87  Resp: 18  Temp: 98.1 F (36.7 C)  TempSrc: Tympanic  Weight: 245 lb (111.1 kg)   Physical Exam  Constitutional: He is oriented to person, place, and time.  He is sitting in a wheelchair. Appears in no acute distress  HENT:  Head: Normocephalic and atraumatic.  Eyes: Pupils are equal, round, and reactive to light. EOM are normal.  Neck: Normal range of motion.  Cardiovascular: Normal rate, regular rhythm and normal heart sounds.   Pulmonary/Chest: Effort normal and breath sounds normal.  Abdominal: Soft. Bowel sounds are normal.  Musculoskeletal: He exhibits edema (b/l +1).  Neurological: He is alert and oriented to person, place, and time.  Skin: Skin is warm and dry.     CMP Latest Ref Rng & Units 02/05/2017  Glucose 65 - 99 mg/dL 116(H)  BUN 6 - 20 mg/dL 17  Creatinine 0.61 - 1.24 mg/dL 0.78  Sodium 135 - 145 mmol/L 138  Potassium 3.5 - 5.1 mmol/L 4.4  Chloride 101 - 111 mmol/L 99(L)  CO2 22 - 32 mmol/L 33(H)  Calcium 8.9 - 10.3 mg/dL 9.6  Total Protein 6.5 - 8.1 g/dL 7.4  Total Bilirubin 0.3 - 1.2 mg/dL 0.7  Alkaline Phos 38 - 126 U/L 62  AST 15 - 41 U/L 21  ALT 17 - 63 U/L 26   CBC Latest Ref Rng & Units 02/05/2017  WBC 3.8 - 10.6  K/uL 5.7  Hemoglobin 13.0 - 18.0 g/dL 14.7  Hematocrit 40.0 - 52.0 % 40.6  Platelets 150 - 440 K/uL 156    No images are attached to the encounter.  Dg Chest 2 View  Result Date: 02/18/2017 CLINICAL DATA:  Polycythemia.  No chest complaint EXAM: CHEST  2 VIEW COMPARISON:  12/25/2014 FINDINGS: There is no edema, consolidation, effusion, nodule or pneumothorax. Spinal stimulator and pump. Spondylosis. No acute osseous finding. IMPRESSION: No evidence of active disease. Electronically Signed   By: Monte Fantasia M.D.   On: 02/18/2017 13:15     Assessment and plan- Patient is a 66 y.o. male with secondary polycythemia likely due to testosterone replacement therapy which has now been stopped  Testosterone was stopped over 4 months ago. H/H norma. Work up for primary polycythemia  including EPO level and jak2 mutation testing negative. This is therefore secondary polycythemia due to testosterone replacement therapy.I will see him once at 6 months from now and if H/H normal, no need for f/u with hematology  Prostate cancer- being managed by urology.   Leg weakness- if it worsens, neurology eval can be considered by pcp.    Visit Diagnosis 1. Polycythemia, secondary      Dr. Randa Evens, MD, MPH [facility] Pager- [phone] 02/19/2017 1:57 PM

## 2017-04-06 ENCOUNTER — Other Ambulatory Visit: Payer: Self-pay | Admitting: Urology

## 2017-04-06 DIAGNOSIS — Z87442 Personal history of urinary calculi: Secondary | ICD-10-CM

## 2017-04-06 DIAGNOSIS — R1084 Generalized abdominal pain: Secondary | ICD-10-CM

## 2017-04-06 DIAGNOSIS — R102 Pelvic and perineal pain: Secondary | ICD-10-CM

## 2017-04-07 ENCOUNTER — Ambulatory Visit
Admission: RE | Admit: 2017-04-07 | Discharge: 2017-04-07 | Disposition: A | Discharge status: Home / Self Care | Payer: Medicare Other | Source: Ambulatory Visit | Attending: Urology | Admitting: Urology

## 2017-04-07 DIAGNOSIS — R1084 Generalized abdominal pain: Secondary | ICD-10-CM

## 2017-04-07 DIAGNOSIS — R109 Unspecified abdominal pain: Secondary | ICD-10-CM | POA: Diagnosis not present

## 2017-04-07 DIAGNOSIS — G8220 Paraplegia, unspecified: Secondary | ICD-10-CM | POA: Insufficient documentation

## 2017-04-07 DIAGNOSIS — Z87442 Personal history of urinary calculi: Secondary | ICD-10-CM | POA: Diagnosis not present

## 2017-04-07 DIAGNOSIS — N289 Disorder of kidney and ureter, unspecified: Secondary | ICD-10-CM | POA: Diagnosis not present

## 2017-04-07 DIAGNOSIS — C61 Malignant neoplasm of prostate: Secondary | ICD-10-CM | POA: Insufficient documentation

## 2017-04-07 DIAGNOSIS — R102 Pelvic and perineal pain: Secondary | ICD-10-CM | POA: Insufficient documentation

## 2017-04-07 DIAGNOSIS — I700 Atherosclerosis of aorta: Secondary | ICD-10-CM | POA: Insufficient documentation

## 2017-04-07 DIAGNOSIS — N210 Calculus in bladder: Secondary | ICD-10-CM | POA: Insufficient documentation

## 2017-04-07 DIAGNOSIS — N312 Flaccid neuropathic bladder, not elsewhere classified: Secondary | ICD-10-CM | POA: Diagnosis not present

## 2017-04-07 HISTORY — DX: Essential (primary) hypertension: I10

## 2017-04-07 LAB — POCT I-STAT CREATININE: Creatinine, Ser: 0.8 mg/dL (ref 0.61–1.24)

## 2017-04-07 MED ORDER — IOPAMIDOL (ISOVUE-300) INJECTION 61%
100.0000 mL | Freq: Once | INTRAVENOUS | Status: AC | PRN
  Administered 2017-04-07: 100 mL via INTRAVENOUS

## 2017-04-30 ENCOUNTER — Other Ambulatory Visit: Payer: Self-pay

## 2017-04-30 ENCOUNTER — Encounter
Admission: RE | Admit: 2017-04-30 | Discharge: 2017-04-30 | Disposition: A | Discharge status: Home / Self Care | Payer: Medicare Other | Source: Ambulatory Visit | Attending: Urology | Admitting: Urology

## 2017-04-30 DIAGNOSIS — Z01818 Encounter for other preprocedural examination: Secondary | ICD-10-CM | POA: Insufficient documentation

## 2017-04-30 DIAGNOSIS — N210 Calculus in bladder: Secondary | ICD-10-CM | POA: Insufficient documentation

## 2017-04-30 DIAGNOSIS — R9431 Abnormal electrocardiogram [ECG] [EKG]: Secondary | ICD-10-CM | POA: Diagnosis not present

## 2017-04-30 HISTORY — DX: Presence of other specified devices: Z97.8

## 2017-04-30 HISTORY — DX: Paralytic syndrome, unspecified: G83.9

## 2017-04-30 HISTORY — DX: Other specified postprocedural states: Z98.890

## 2017-04-30 HISTORY — DX: Presence of other specified functional implants: Z96.89

## 2017-04-30 HISTORY — DX: Other specified congenital malformations of circulatory system: Q28.8

## 2017-04-30 HISTORY — DX: Personal history of urinary calculi: Z87.442

## 2017-04-30 NOTE — Patient Instructions (Addendum)
Your procedure is scheduled on:  October 16,2018  Report to [place]  To find out your arrival time please call [phone] between 1PM - 3PM on Monday, October 15TH, 2018.   Remember: Instructions that are not followed completely may result in serious medical risk, up to and including death, or upon the discretion of your surgeon and anesthesiologist your surgery may need to be rescheduled.     _X__ 1. Do not eat food after midnight the night before your procedure.                 No gum chewing or hard candies. You may drink clear liquids up to 2 hours                 before you are scheduled to arrive for your surgery- DO not drink clear                 liquids within 2 hours of the start of your surgery.                 Clear Liquids include:  water, apple juice without pulp, clear carbohydrate                 drink such as Clearfast of Gartorade, Black Coffee or Tea (Do not add                 anything to coffee or tea).     _X__ 2.  No Alcohol for 24 hours before or after surgery.   _X__ 3.  Do Not Smoke or use e-cigarettes For 24 Hours Prior to Your Surgery.                 Do not use any chewable tobacco products for at least 6 hours prior to                 surgery.  ____  4.  Bring all medications with you on the day of surgery if instructed.   __X__  5.  Notify your doctor if there is any change in your medical condition      (cold, fever, infections).     Do not wear jewelry, make-up, hairpins, clips or nail polish. Do not wear lotions, powders, or perfumes. You may wear deodorant. Do not shave 48 hours prior to surgery. Men may shave face and neck. Do not bring valuables to the hospital.    [facility] is not responsible for any belongings or valuables.  Contacts, dentures or bridgework may not be worn into surgery. Leave your suitcase in the car. After surgery it may be brought to your room. For patients admitted to the  hospital, discharge time is determined by your treatment team.   Patients discharged the day of surgery will not be allowed to drive home.   Please read over the following fact sheets that you were given:   PREOPERATIVE HANDOUT   ____ Take these medicines the morning of surgery with A SIP OF WATER:    1. Neurontin  2. Baclofen may be taken if needed on morning of surgery  3. Myrbetriq  4. Oxybutynin  5.  6.  ____ Fleet Enema (as directed)   ____ Use CHG Soap as directed  ____ Use inhalers on the day of surgery  ____ Stop metformin 2 days prior to surgery    ____ Take 1/2 of usual insulin dose the night before surgery. No insulin the morning  of surgery.   ____ Stop Coumadin/Plavix/aspirin as of today  ____ Stop Anti-inflammatories as of today.  Tylenol for back up pain support is acceptable.   ____ Stop supplements until after surgery.    ____ Bring C-Pap to the hospital.   Continue to take Casodex, Losartan, Hiprex, Macrodantin, Vitamin C up until the day of surgery, but do not take on the morning of surgery.  Please bring the remote for the pain stimulator so that it may be shut off on the day of surgery.  Bring a catheter or 2 for your use, if needed.

## 2017-05-01 NOTE — OR Nursing (Signed)
Telephone call to Dr. Yves Dill to inform him of no H & P. Answering service unable to take nonemergent messages.

## 2017-05-04 MED ORDER — GENTAMICIN IN SALINE 1.6-0.9 MG/ML-% IV SOLN
80.0000 mg | Freq: Once | INTRAVENOUS | Status: AC
  Administered 2017-05-05: 80 mg via INTRAVENOUS
  Filled 2017-05-04: qty 50

## 2017-05-04 MED ORDER — GENTAMICIN SULFATE 40 MG/ML IJ SOLN
80.0000 mg | Freq: Once | INTRAVENOUS | Status: DC
  Filled 2017-05-04: qty 2

## 2017-05-04 NOTE — H&P (Signed)
NAME:  [name], BRANSFIELD                 ACCOUNT NO.:  MEDICAL RECORD NO.:  [id number]  LOCATION:                                 FACILITY:  PHYSICIAN:  Maryan Puls          DATE OF BIRTH:  02-25-1951  DATE OF ADMISSION: DATE OF DISCHARGE:                            HISTORY AND PHYSICAL   CHIEF COMPLAINT:  Bladder stone.  HISTORY OF PRESENT ILLNESS:  Mr. Petties is a 66 year old white male, paraplegic with neurogenic bladder.  He was found to have a bladder stone during cystoscopic examination.  He has been experiencing more frequent bladder spasms recently.  He also had a CT scan performed in September, which revealed no hydronephrosis, but mild renal atrophy and a left upper pole indeterminate renal cyst.  The CT scan also confirmed the presence of a 5 mm bladder stone.  ALLERGIES:  THE PATIENT IS ALLERGIC TO PENICILLIN, DOXYCYCLINE, CLINDAMYCIN, AND TRIMETHOPRIM WITH SULFUR.  CURRENT MEDICATIONS:  Include baclofen, methenamine, diclofenac, gabapentin, Senexon, Myrbetriq, losartan, gabapentin, bicalutamide, vitamin C, oxybutynin, losartan, polyethylene glycol, and Lupron.  PAST MEDICAL HISTORY: 1. Paraplegia. 2. History of kidney stones. 3. Prostate cancer. 4. Hypertension. 5. Hyperlipidemia. 6. Neurogenic bladder. 7. Urethral stricture disease.  SURGICAL HISTORY: 1. Arthroscopic ACL repair on the right side in the 1970s. 2. Back surgery. 3. Cataract surgery. 4. Colonoscopy. 5. Spinal cord stimulator for control of spasms. 6. Internal urethrotomy for stricture disease in 2013 and in 2016.  REVIEW OF SYSTEMS:  The patient denied chest pain, shortness of breath, or stroke.  PHYSICAL EXAMINATION:  GENERAL:  Obese white male, in no acute distress. HEENT:  Sclerae are clear. NECK:  Supple.  No palpable cervical adenopathy. LUNGS:  Clear to auscultation. CARDIOVASCULAR:  Regular rhythm and rate without audible murmurs. ABDOMEN:  Soft and nontender abdomen. GU:   Uncircumcised.  Testes smooth and nontender.  Atrophic. RECTAL:  20 g flat, smooth, nontender prostate. NEUROMUSCULAR:  Consistent with paraplegia.  Alert and oriented x3.  IMPRESSION: 1. Bladder stone. 2. Neurogenic bladder. 3. Paraplegia. 4. Urethral stricture disease. 5. Prostate cancer.  PLAN:  Cystoscopy with litholapaxy of bladder stone.          ______________________________ Maryan Puls     MW/MEDQ  D:  05/04/2017  T:  05/04/2017  Job:  [id number]

## 2017-05-05 ENCOUNTER — Encounter
Admission: RE | Disposition: A | Discharge status: Home / Self Care | Payer: Self-pay | Source: Ambulatory Visit | Attending: Urology

## 2017-05-05 ENCOUNTER — Ambulatory Visit: Payer: Medicare Other | Admitting: Certified Registered"

## 2017-05-05 ENCOUNTER — Ambulatory Visit
Admission: RE | Admit: 2017-05-05 | Discharge: 2017-05-05 | Disposition: A | Discharge status: Home / Self Care | Payer: Medicare Other | Source: Ambulatory Visit | Attending: Urology | Admitting: Urology

## 2017-05-05 ENCOUNTER — Encounter: Payer: Self-pay | Admitting: *Deleted

## 2017-05-05 DIAGNOSIS — E785 Hyperlipidemia, unspecified: Secondary | ICD-10-CM | POA: Diagnosis not present

## 2017-05-05 DIAGNOSIS — Z8546 Personal history of malignant neoplasm of prostate: Secondary | ICD-10-CM | POA: Diagnosis not present

## 2017-05-05 DIAGNOSIS — Z880 Allergy status to penicillin: Secondary | ICD-10-CM | POA: Diagnosis not present

## 2017-05-05 DIAGNOSIS — N319 Neuromuscular dysfunction of bladder, unspecified: Secondary | ICD-10-CM | POA: Insufficient documentation

## 2017-05-05 DIAGNOSIS — Z87442 Personal history of urinary calculi: Secondary | ICD-10-CM | POA: Insufficient documentation

## 2017-05-05 DIAGNOSIS — Z79899 Other long term (current) drug therapy: Secondary | ICD-10-CM | POA: Insufficient documentation

## 2017-05-05 DIAGNOSIS — I10 Essential (primary) hypertension: Secondary | ICD-10-CM | POA: Insufficient documentation

## 2017-05-05 DIAGNOSIS — N210 Calculus in bladder: Secondary | ICD-10-CM | POA: Diagnosis not present

## 2017-05-05 DIAGNOSIS — G8220 Paraplegia, unspecified: Secondary | ICD-10-CM | POA: Diagnosis not present

## 2017-05-05 DIAGNOSIS — N35919 Unspecified urethral stricture, male, unspecified site: Secondary | ICD-10-CM | POA: Insufficient documentation

## 2017-05-05 HISTORY — PX: HOLMIUM LASER APPLICATION: SHX5852

## 2017-05-05 HISTORY — PX: CYSTOSCOPY WITH LITHOLAPAXY: SHX1425

## 2017-05-05 HISTORY — PX: CYSTOSCOPY WITH DIRECT VISION INTERNAL URETHROTOMY: SHX6637

## 2017-05-05 SURGERY — CYSTOSCOPY, WITH BLADDER CALCULUS LITHOLAPAXY
Anesthesia: General | Site: Bladder | Wound class: Clean Contaminated

## 2017-05-05 MED ORDER — ONDANSETRON HCL 4 MG/2ML IJ SOLN: 4.0000 mg | Freq: Once | INTRAMUSCULAR | Status: DC | PRN

## 2017-05-05 MED ORDER — ONDANSETRON HCL 4 MG/2ML IJ SOLN
INTRAMUSCULAR | Status: AC
  Filled 2017-05-05: qty 2

## 2017-05-05 MED ORDER — FENTANYL CITRATE (PF) 100 MCG/2ML IJ SOLN
INTRAMUSCULAR | Status: DC | PRN
  Administered 2017-05-05 (×2): 50 ug via INTRAVENOUS

## 2017-05-05 MED ORDER — LIDOCAINE HCL 2 % EX GEL
CUTANEOUS | Status: AC
  Filled 2017-05-05: qty 10

## 2017-05-05 MED ORDER — LACTATED RINGERS IV SOLN
INTRAVENOUS | Status: DC
  Administered 2017-05-05: 13:00:00 via INTRAVENOUS

## 2017-05-05 MED ORDER — LIDOCAINE HCL (PF) 2 % IJ SOLN
INTRAMUSCULAR | Status: AC
  Filled 2017-05-05: qty 10

## 2017-05-05 MED ORDER — LEVOFLOXACIN 500 MG PO TABS: 500.0000 mg | ORAL_TABLET | Freq: Every day | ORAL | 0 refills | Status: DC

## 2017-05-05 MED ORDER — FENTANYL CITRATE (PF) 100 MCG/2ML IJ SOLN: 25.0000 ug | INTRAMUSCULAR | Status: DC | PRN

## 2017-05-05 MED ORDER — ONDANSETRON HCL 4 MG/2ML IJ SOLN
INTRAMUSCULAR | Status: DC | PRN
  Administered 2017-05-05: 4 mg via INTRAVENOUS

## 2017-05-05 MED ORDER — LIDOCAINE HCL 2 % EX GEL
CUTANEOUS | Status: DC | PRN
  Administered 2017-05-05: 1

## 2017-05-05 MED ORDER — FAMOTIDINE 20 MG PO TABS
20.0000 mg | ORAL_TABLET | Freq: Once | ORAL | Status: AC
  Administered 2017-05-05: 20 mg via ORAL

## 2017-05-05 MED ORDER — PROPOFOL 10 MG/ML IV BOLUS
INTRAVENOUS | Status: DC | PRN
  Administered 2017-05-05: 200 mg via INTRAVENOUS

## 2017-05-05 MED ORDER — LIDOCAINE HCL (CARDIAC) 20 MG/ML IV SOLN
INTRAVENOUS | Status: DC | PRN
  Administered 2017-05-05: 100 mg via INTRAVENOUS

## 2017-05-05 MED ORDER — FENTANYL CITRATE (PF) 100 MCG/2ML IJ SOLN
INTRAMUSCULAR | Status: AC
  Filled 2017-05-05: qty 2

## 2017-05-05 MED ORDER — PROPOFOL 10 MG/ML IV BOLUS
INTRAVENOUS | Status: AC
  Filled 2017-05-05: qty 20

## 2017-05-05 SURGICAL SUPPLY — 36 items
BAG DRAIN CYSTO-URO LG1000N (MISCELLANEOUS) ×4 IMPLANT
BAG URINE DRAINAGE (UROLOGICAL SUPPLIES) ×2 IMPLANT
CATH FOLEY 2W COUNCIL 20FR 5CC (CATHETERS) ×2 IMPLANT
CATH URETL 5X70 OPEN END (CATHETERS) ×2 IMPLANT
CNTNR SPEC 2.5X3XGRAD LEK (MISCELLANEOUS) ×3
CONRAY 43 FOR UROLOGY 50M (MISCELLANEOUS) ×4 IMPLANT
CONT SPEC 4OZ STER OR WHT (MISCELLANEOUS) ×1
CONT SPEC 4OZ STRL OR WHT (MISCELLANEOUS) ×3
CONTAINER SPEC 2.5X3XGRAD LEK (MISCELLANEOUS) ×3 IMPLANT
FIBER LASER 365 (Laser) IMPLANT
FIBER LASER 550 (Laser) ×2 IMPLANT
GLOVE BIO SURGEON STRL SZ7 (GLOVE) ×8 IMPLANT
GLOVE BIO SURGEON STRL SZ7.5 (GLOVE) ×4 IMPLANT
GOWN STRL REUS W/ TWL LRG LVL4 (GOWN DISPOSABLE) ×3 IMPLANT
GOWN STRL REUS W/ TWL XL LVL3 (GOWN DISPOSABLE) ×3 IMPLANT
GOWN STRL REUS W/TWL LRG LVL4 (GOWN DISPOSABLE) ×4
GOWN STRL REUS W/TWL XL LVL3 (GOWN DISPOSABLE) ×4
GOWN STRL REUS W/TWL XL LVL4 (GOWN DISPOSABLE) ×4 IMPLANT
GUIDEWIRE STR ZIPWIRE 035X150 (MISCELLANEOUS) ×2 IMPLANT
HOLDER FOLEY CATH W/STRAP (MISCELLANEOUS) ×2 IMPLANT
KIT RM TURNOVER CYSTO AR (KITS) ×4 IMPLANT
PACK CYSTO AR (MISCELLANEOUS) ×4 IMPLANT
PREP PVP WINGED SPONGE (MISCELLANEOUS) ×2 IMPLANT
SENSORWIRE 0.038 NOT ANGLED (WIRE) ×4
SET CYSTO W/LG BORE CLAMP LF (SET/KITS/TRAYS/PACK) ×2 IMPLANT
SET IRRIG Y TYPE TUR BLADDER L (SET/KITS/TRAYS/PACK) ×4 IMPLANT
SOL .9 NS 3000ML IRR  AL (IV SOLUTION)
SOL .9 NS 3000ML IRR AL (IV SOLUTION)
SOL .9 NS 3000ML IRR UROMATIC (IV SOLUTION) ×2 IMPLANT
SOL PREP PVP 2OZ (MISCELLANEOUS) ×4
SOLUTION PREP PVP 2OZ (MISCELLANEOUS) ×3 IMPLANT
SURGILUBE 2OZ TUBE FLIPTOP (MISCELLANEOUS) ×4 IMPLANT
SYRINGE IRR TOOMEY STRL 70CC (SYRINGE) ×4 IMPLANT
WATER STERILE IRR 1000ML POUR (IV SOLUTION) ×4 IMPLANT
WATER STERILE IRR 3000ML UROMA (IV SOLUTION) ×6 IMPLANT
WIRE SENSOR 0.038 NOT ANGLED (WIRE) ×1 IMPLANT

## 2017-05-05 NOTE — Progress Notes (Signed)
Patient self catheterized for 350 cc urine.

## 2017-05-05 NOTE — Progress Notes (Signed)
Patient arrived in wheelchair.  Able to transfer to bed with minimal assisstance, unable to stand or walk.

## 2017-05-05 NOTE — Anesthesia Post-op Follow-up Note (Signed)
Anesthesia QCDR form completed.        

## 2017-05-05 NOTE — Anesthesia Preprocedure Evaluation (Addendum)
Anesthesia Evaluation  Patient identified by MRN, date of birth, ID band Patient awake    Reviewed: Allergy & Precautions, NPO status , Patient's Chart, lab work & pertinent test results, reviewed documented beta blocker date and time   Airway Mallampati: III  TM Distance: >3 FB     Dental  (+) Chipped, Missing   Pulmonary           Cardiovascular hypertension, Pt. on medications      Neuro/Psych  Neuromuscular disease    GI/Hepatic   Endo/Other    Renal/GU      Musculoskeletal   Abdominal   Peds  Hematology   Anesthesia Other Findings He has a spinal cord stimulator placed 1 yr ago. Apain pump with a continuous basal flow. We will turn off the stimulator.  Reproductive/Obstetrics                            Anesthesia Physical Anesthesia Plan  ASA: III  Anesthesia Plan: General   Post-op Pain Management:    Induction: Intravenous  PONV Risk Score and Plan:   Airway Management Planned: Oral ETT and LMA  Additional Equipment:   Intra-op Plan:   Post-operative Plan:   Informed Consent: I have reviewed the patients History and Physical, chart, labs and discussed the procedure including the risks, benefits and alternatives for the proposed anesthesia with the patient or authorized representative who has indicated his/her understanding and acceptance.     Plan Discussed with: CRNA  Anesthesia Plan Comments:         Anesthesia Quick Evaluation

## 2017-05-05 NOTE — Transfer of Care (Signed)
Immediate Anesthesia Transfer of Care Note  Patient: [name]  Procedure(s) Performed: CYSTOSCOPY WITH LITHOLAPAXY (N/A Bladder) HOLMIUM LASER APPLICATION (N/A Bladder) CYSTOSCOPY WITH DIRECT VISION INTERNAL URETHROTOMY  Patient Location: PACU  Anesthesia Type:General  Level of Consciousness: sedated and responds to stimulation  Airway & Oxygen Therapy: Patient Spontanous Breathing and Patient connected to face mask oxygen  Post-op Assessment: Report given to RN and Post -op Vital signs reviewed and stable  Post vital signs: Reviewed and stable  Last Vitals:  Vitals:   05/05/17 1415 05/05/17 1417  BP: 138/84 138/84  Pulse: 86 85  Resp: (!) 9 12  Temp: (!) 36.4 C   SpO2: 98% 99%    Last Pain:  Vitals:   05/05/17 1236  TempSrc: Tympanic  PainSc: 8          Complications: No apparent anesthesia complications

## 2017-05-05 NOTE — Discharge Instructions (Addendum)
AMBULATORY SURGERY  DISCHARGE INSTRUCTIONS   1) The drugs that you were given will stay in your system until tomorrow so for the next 24 hours you should not:  A) Drive an automobile B) Make any legal decisions C) Drink any alcoholic beverage   2) You may resume regular meals tomorrow.  Today it is better to start with liquids and gradually work up to solid foods.  You may eat anything you prefer, but it is better to start with liquids, then soup and crackers, and gradually work up to solid foods.   3) Please notify your doctor immediately if you have any unusual bleeding, trouble breathing, redness and pain at the surgery site, drainage, fever, or pain not relieved by medication.    4) Additional Instructions:        Please contact your physician with any problems or Same Day Surgery at [phone], Monday through Friday 6 am to 4 pm, or [place] [facility] number at [phone].   Urethral Stricture Urethral stricture is narrowing of the tube (urethra) that carries urine from the bladder out of the body. The urethra can become narrow due to scar tissue from an injury or infection. This can make it difficult to pass urine. In women, the urethra opens above the vaginal opening. In men, the urethra opens at the tip of the penis, and the urethra is much longer than it is in women. Because of the length of the male urethra, urethral stricture is much more common in men. This condition is treated with surgery. What are the causes? Common causes of urethral stricture in men and women include:  Urinary tract infection (UTI).  Sexually transmitted infection (STI).  Use of a tube placed into the urethra to drain urine from the bladder (urinary catheter).  Urinary tract surgery.  In men, common causes of urethral stricture include:  A severe injury to the pelvis.  Prostate surgery.  Injury to the penis.  In many cases, the cause of urethral stricture may not be  known. What increases the risk? Urethral stricture is more likely to develop in:  Men, especially men who have had prostate surgery.  People who use urinary catheters.  People who have had urinary tract surgery.  What are the signs or symptoms? The most common symptom of this condition is difficulty passing urine. This may cause decreased urine flow, dribbling, or spraying of urine. Other symptoms may include:  Frequent UTIs.  Blood in the urine.  Pain when urinating.  Swelling of the penis in men.  Inability to pass urine (urinary obstruction).  How is this diagnosed? This condition may be diagnosed based on:  Your medical history.  A physical exam.  Urine tests to check for infection or bleeding.  X-rays.  Ultrasound.  Retrograde urethrogram. This is a type of test in which dye is injected into the urethra and then an X-ray is taken.  Urethroscopy. This is when a thin tube with a light and camera on the end (urethroscope) is used to look at the urethra.  How is this treated? This condition is treated with surgery. The type of surgery that you have depends on the severity of your condition. You may have:  Urethral dilation. In this procedure, the narrow part of the urethra is stretched open (dilated) with dilating instruments or a small balloon.  Urethrotomy. In this procedure, a urethroscope is placed into the urethra, and the narrow part of the urethra is cut open with a surgical  blade inserted through the urethroscope.  Open surgery. In this procedure, an incision is made in the urethra, the narrow part is removed, and the urethra is reconstructed.  Follow these instructions at home:   Take over-the-counter and prescription medicines only as told by your health care provider.  If you were prescribed an antibiotic medicine, take it as told by your health care provider. Do not stop taking the antibiotic even if you start to feel better.  Drink enough fluid to  keep your urine clear or pale yellow.  Keep all follow-up visits as told by your health care provider. This is important. Contact a health care provider if:  You have signs of a urinary tract infection, such as: ? Frequent urination or passing small amounts of urine frequently. ? Needing to urinate urgently. ? Pain or burning with urination. ? Urine that smells bad or unusual. ? Cloudy urine. ? Pain in the lower abdomen or back. ? Trouble urinating. ? Blood in the urine. ? Vomiting or being less hungry than normal. ? Diarrhea or abdominal pain. ? Vaginal discharge, if you are male.  Your symptoms are getting worse instead of better. Get help right away if:  You cannot pass urine.  You have a fever.  You have swelling, bruising, or discoloration of your genital area. This includes the penis, scrotum, and inner thighs for men, and the outer genital organs (vulva) and inner thighs for women.  You develop swelling in your legs.  You have difficulty breathing. This information is not intended to replace advice given to you by your health care provider. Make sure you discuss any questions you have with your health care provider. Document Released: 08/03/2015 Document Revised: 12/13/2015 Document Reviewed: 06/24/2015 Elsevier Interactive Patient Education  2018 [place]. Urethral Dilation, Care After Refer to this sheet in the next few weeks. These instructions provide you with information about caring for yourself after your procedure. Your health care provider may also give you more specific instructions. Your treatment has been planned according to current medical practices, but problems sometimes occur. Call your health care provider if you have any problems or questions after your procedure. What can I expect after the procedure? After the procedure, it is common to have:  Burning pain when urinating.  Blood in your urine.  A need to urinate frequently.  Follow these  instructions at home: Medicines  Take over-the-counter and prescription medicines only as told by your health care provider.  If you were prescribed antibiotic medicine, take it as told by your health care provider. Do not stop taking the antibiotic even if you start to feel better. Driving  Do not drive or operate heavy machinery while taking prescription pain medicine.  Do not drive for 24 hours if you received a medicine to help you relax (sedative) during your procedure. General instructions   If you were sent home with a catheter, follow your health care provider's instructions about how and when to use it.  Drink enough fluid to keep your urine clear or pale yellow.  Return to your normal activities as told by your health care provider. Ask your health care provider what activities are safe for you.  Keep all follow-up visits as told by your health care provider. This is important. Contact a health care provider if:  Your urine is cloudy and smells bad.  You develop new bleeding when you urinate.  You pass blood clots when you urinate.  You have pain that does not  get better with medicine. Get help right away if:  You develop new bleeding that does not stop.  You cannot pass urine.  You have a fever.  You have swelling, bruising, or discoloration of your genital area. This includes the penis, scrotum, and inner thighs for men, and the outer genital organs (vulva) and inner thighs for women. This information is not intended to replace advice given to you by your health care provider. Make sure you discuss any questions you have with your health care provider. Document Released: 08/03/2015 Document Revised: 12/13/2015 Document Reviewed: 06/24/2015 Elsevier Interactive Patient Education  2018 [place], Adult Take good care of your catheter to keep it working and to prevent problems. How to wear your catheter Attach your catheter  to your leg with tape (adhesive tape) or a leg strap. Make sure it is not too tight. If you use tape, remove any bits of tape that are already on the catheter. How to wear a drainage bag You should have:  A large overnight bag.  A small leg bag.  Overnight Bag You may wear the overnight bag at any time. Always keep the bag below the level of your bladder but off the floor. When you sleep, put a clean plastic bag in a wastebasket. Then hang the bag inside the wastebasket. Leg Bag Never wear the leg bag at night. Always wear the leg bag below your knee. Keep the leg bag secure with a leg strap or tape. How to care for your skin  Clean the skin around the catheter at least once every day.  Shower every day. Do not take baths.  Put creams, lotions, or ointments on your genital area only as told by your doctor.  Do not use powders, sprays, or lotions on your genital area. How to clean your catheter and your skin 1. Wash your hands with soap and water. 2. Wet a washcloth in warm water and gentle (mild) soap. 3. Use the washcloth to clean the skin where the catheter enters your body. Clean downward and wipe away from the catheter in small circles. Do not wipe toward the catheter. 4. Pat the area dry with a clean towel. Make sure to clean off all soap. How to care for your drainage bags Empty your drainage bag when it is ?- full or at least 2-3 times a day. Replace your drainage bag once a month or sooner if it starts to smell bad or look dirty. Do not clean your drainage bag unless told by your doctor. Emptying a drainage bag  Supplies Needed  Rubbing alcohol.  Gauze pad or cotton ball.  Tape or a leg strap.  Steps 1. Wash your hands with soap and water. 2. Separate (detach) the bag from your leg. 3. Hold the bag over the toilet or a clean container. Keep the bag below your hips and bladder. This stops pee (urine) from going back into the tube. 4. Open the pour spout at the bottom  of the bag. 5. Empty the pee into the toilet or container. Do not let the pour spout touch any surface. 6. Put rubbing alcohol on a gauze pad or cotton ball. 7. Use the gauze pad or cotton ball to clean the pour spout. 8. Close the pour spout. 9. Attach the bag to your leg with tape or a leg strap. 10. Wash your hands.  Changing a drainage bag Supplies Needed  Alcohol wipes.  A clean drainage bag.  Adhesive  tape or a leg strap.  Steps 1. Wash your hands with soap and water. 2. Separate the dirty bag from your leg. 3. Pinch the rubber catheter with your fingers so that pee does not spill out. 4. Separate the catheter tube from the drainage tube where these tubes connect (at the connection valve). Do not let the tubes touch any surface. 5. Clean the end of the catheter tube with an alcohol wipe. Use a different alcohol wipe to clean the end of the drainage tube. 6. Connect the catheter tube to the drainage tube of the clean bag. 7. Attach the new bag to the leg with adhesive tape or a leg strap. 8. Wash your hands.  How to prevent infection and other problems  Never pull on your catheter or try to remove it. Pulling can damage tissue in your body.  Always wash your hands before and after touching your catheter.  If a leg strap gets wet, replace it with a dry one.  Drink enough fluids to keep your pee clear or pale yellow, or as told by your doctor.  Do not let the drainage bag or tubing touch the floor.  Wear cotton underwear.  If you are male, wipe from front to back after you poop (have a bowel movement).  Check on the catheter often to make sure it works and the tubing is not twisted. Get help if:  Your pee is cloudy.  Your pee smells unusually bad.  Your pee is not draining into the bag.  Your tube gets clogged.  Your catheter starts to leak.  Your bladder feels full. Get help right away if:  You have redness, swelling, or pain where the catheter enters  your body.  You have fluid, pus, or a bad smell coming from the area where the catheter enters your body.  The area where the catheter enters your body feels warm.  You have a fever.  You have pain in your: ? Stomach (abdomen). ? Legs. ? Lower back. ? Bladder.  You see blood fill the catheter.  Your pee is pink or red.  You feel sick to your stomach (nauseous).  You throw up (vomit).  You have chills.  Your catheter gets pulled out. This information is not intended to replace advice given to you by your health care provider. Make sure you discuss any questions you have with your health care provider. Document Released: 11/01/2012 Document Revised: 06/04/2016 Document Reviewed: 12/20/2013 Elsevier Interactive Patient Education  2018 [place] A bladder stone is a buildup of crystals made from the proteins and minerals found in urine. These substances build up when your urine becomes too concentrated. Bladder stones usually develop when you have another medical condition that prevents your bladder from emptying completely. Crystals can form in the small amount of urine left in your bladder. Bladder stones that grow large can become painful and block the flow of urine. What are the causes? Bladder stones can be caused by:  An enlarged prostate, which prevents the bladder from emptying well.  A urinary tract infection (UTI).  A weak spot in the bladder that creates a small pouch (bladder diverticulum).  Nerve damage that may interfere with the messages from your brain to your bladder muscles (neurogenic bladder). This can result from conditions such as Parkinson disease or spinal cord injuries.  What increases the risk? This condition is more likely to develop in people who:  Get frequent UTIs.  Have another medical condition that  affects their bladder.  Have a history of bladder surgery.  Have a spinal cord injury.  Have an abnormally shaped  bladder (deformity).  What are the signs or symptoms? Small bladder stones do not always cause symptoms. Larger stones can cause symptoms that include:  Abdominal pain.  A frequent need to urinate.  Difficulty urinating.  Painful urination.  Blood in the urine.  Cloudy or dark colored urine.  Pain in the penis or testicles for men.  How is this diagnosed? This condition is diagnosed based on your symptoms, medical history, and a physical exam. The exam will include checking for abdominal tenderness. For men, a rectal exam may be done to check the prostate gland. You may also have other tests, such as:  A urine test (urinalysis) to find out more about your condition.  A urine sample test to check for other infections (culture).  Blood tests, including tests to look for a substance called creatinine. A creatinine level that is higher than normal could indicate a blockage.  A procedure to examine the inside of your bladder using a thin scope with a tiny lighted camera (cystoscopy) inserted through the urethra.  You may also have imaging studies such as:  A CT scan of your abdomen and pelvis to look for a stone and check whether it is blocking the flow of urine.  An X-ray of your kidneys, ureters, bladder, and urethra after you have a type of dye (contrast material) injected into your veins (intravenous pyelogram or IVP).  An abdominal and pelvic ultrasound to locate bladder stones and identify areas where urine flow is blocked.  How is this treated? Small bladder stones do not require treatment. They can pass out of your body on their own. You may be instructed to drink extra water to help the stone pass through the bladder. Larger stones may need to be removed with one of the following procedures:  Cystolitholapaxy. A cystoscope is inserted through the urethra and into the bladder to view the stone. A laser, ultrasound, or other device is used to break the stone into smaller  pieces. Fluids are used to flush the small pieces from the area.  Surgical removal. You may need surgery to remove the stone if it is large and causing pain. A small incision is made in the bladder to directly remove the stone.  If the stone blocks the flow of urine, you may have a thin, flexible tube (stent) threaded into your ureter. The stent may be left in place after removal of a stone to ensure flow of urine until healing is complete.  Follow these instructions at home:  Drink enough fluid to keep your urine clear or pale yellow.  Report unusual urinary symptoms to your health care provider. Early diagnosis of an enlarged prostate and other bladder conditions may reduce your chance of getting bladder stones.  Avoid smoking and illegal drug use. Contact a health care provider if:  You have a fever.  You feel nauseous or vomit.  You are unable to urinate.  You have a large amount of blood in your urine. Get help right away if:  You have severe back pain or lower abdominal pain.  You are vomiting and cannot keep down any medicines or water. This information is not intended to replace advice given to you by your health care provider. Make sure you discuss any questions you have with your health care provider. Document Released: 07/22/2015 Document Revised: 12/14/2015 Document Reviewed: 07/22/2015 Elsevier Interactive  Patient Education  [employer].

## 2017-05-05 NOTE — Op Note (Signed)
Preoperative diagnosis:                                           1. Bladder stones                                           2. Urethral stricture disease                                           3. Neurogenic  bladder  Postoperative diagnosis:same   Procedure:                     1. Lithopaxy of bladder stones with holmium laser                       2. Internal urethrotomy of urethral stricture with holmium laser                     3. Foley catheter placement  Surgeon: [name]. Yves Dill MD  Anesthesia: General  Indications: See the history and physical. After informed consent the above procedure(s) were requested     Technique and findings: After adequate general anesthesia had been obtained the patient was placed into dorsal lithotomy position and the perineum was prepped and draped in the usual fashion. A 21 French cystoscope was coupled to the camera and visually advanced into the proximal small urethra. Stricture disease was encountered in the distal bulbar urethra with an aperture of approximately 16 [place]. The 550  holmium laser fiber was introduced through the scope and set at a power level of 0.5 J and frequency of 10 Hz. The stricture was incised at the 12:00 position, allowing the scope to be advanced into the bladder. The bladder was thoroughly inspected. Both ureteral orifices were identified and had clear efflux. The bladder was moderately trabeculated. 2 bladder stones measuring approximately 10 mm each were encountered. The 550  holmium laser was advanced into the bladder and set at a power level of 0.5 J and frequency of 5 Hz. The stones were disintegrated. The stone fragments were evacuated from the bladder using a [place] syringe. A 0.035 Glidewire was then advanced into the bladder through the cystoscope and the cystoscope was removed.10 cc of viscous Xylocaine was instilled within the urethra.A 20 French [place] catheter was then advanced over the guidewire and passed  into the bladder. The guidewire was then removed. The procedure was then terminated and patient transferred to the recovery room in stable condition.

## 2017-05-05 NOTE — Anesthesia Postprocedure Evaluation (Signed)
Anesthesia Post Note  Patient: [name]  Procedure(s) Performed: CYSTOSCOPY WITH LITHOLAPAXY (N/A Bladder) HOLMIUM LASER APPLICATION (N/A Bladder) CYSTOSCOPY WITH DIRECT VISION INTERNAL URETHROTOMY  Patient location during evaluation: PACU Anesthesia Type: General Level of consciousness: awake and alert Pain management: pain level controlled Vital Signs Assessment: post-procedure vital signs reviewed and stable Respiratory status: spontaneous breathing, nonlabored ventilation, respiratory function stable and patient connected to nasal cannula oxygen Cardiovascular status: blood pressure returned to baseline and stable Postop Assessment: no apparent nausea or vomiting Anesthetic complications: no     Last Vitals:  Vitals:   05/05/17 1417 05/05/17 1430  BP: 138/84   Pulse: 85 80  Resp: 12 15  Temp:    SpO2: 99% 100%    Last Pain:  Vitals:   05/05/17 1236  TempSrc: Tympanic  PainSc: [place]

## 2017-05-05 NOTE — H&P (Signed)
Date of Initial H&P: 05/04/17  History reviewed, patient examined, no change in status, stable for surgery.

## 2017-05-05 NOTE — Anesthesia Procedure Notes (Signed)
Procedure Name: LMA Insertion Performed by: Jenalyn Girdner Pre-anesthesia Checklist: Patient identified, Patient being monitored, Timeout performed, Emergency Drugs available and Suction available Patient Re-evaluated:Patient Re-evaluated prior to induction Oxygen Delivery Method: Circle system utilized Preoxygenation: Pre-oxygenation with 100% oxygen Induction Type: IV induction LMA: LMA inserted LMA Size: 4.5 Tube type: Oral Number of attempts: 1 Placement Confirmation: positive ETCO2 and breath sounds checked- equal and bilateral Tube secured with: Tape Dental Injury: Teeth and Oropharynx as per pre-operative assessment        

## 2017-05-05 NOTE — Progress Notes (Signed)
Prior to transfer to OR patient turned off spinal cord stimulator.

## 2017-05-06 ENCOUNTER — Encounter: Payer: Self-pay | Admitting: Urology

## 2017-05-20 ENCOUNTER — Encounter: Payer: Self-pay | Admitting: Urology

## 2017-06-17 ENCOUNTER — Telehealth: Payer: Self-pay | Admitting: Oncology

## 2017-06-17 NOTE — Telephone Encounter (Signed)
Lab/MD appt rschd, per Provider on PAL. Appt conf with patient. MF °

## 2017-08-21 ENCOUNTER — Ambulatory Visit: Payer: Medicare Other | Admitting: Oncology

## 2017-08-21 ENCOUNTER — Other Ambulatory Visit: Payer: Medicare Other

## 2017-08-27 ENCOUNTER — Other Ambulatory Visit: Payer: Self-pay | Admitting: *Deleted

## 2017-08-27 DIAGNOSIS — I10 Essential (primary) hypertension: Secondary | ICD-10-CM

## 2017-08-28 ENCOUNTER — Encounter: Payer: Self-pay | Admitting: Oncology

## 2017-08-28 ENCOUNTER — Inpatient Hospital Stay (HOSPITAL_BASED_OUTPATIENT_CLINIC_OR_DEPARTMENT_OTHER): Payer: Medicare Other | Admitting: Oncology

## 2017-08-28 ENCOUNTER — Inpatient Hospital Stay: Payer: Medicare Other | Attending: Oncology

## 2017-08-28 VITALS — BP 162/87 | HR 86 | Temp 98.2°F | Resp 16 | Wt 217.0 lb

## 2017-08-28 DIAGNOSIS — Z79899 Other long term (current) drug therapy: Secondary | ICD-10-CM | POA: Insufficient documentation

## 2017-08-28 DIAGNOSIS — D751 Secondary polycythemia: Secondary | ICD-10-CM | POA: Insufficient documentation

## 2017-08-28 DIAGNOSIS — C61 Malignant neoplasm of prostate: Secondary | ICD-10-CM | POA: Insufficient documentation

## 2017-08-28 DIAGNOSIS — G8929 Other chronic pain: Secondary | ICD-10-CM | POA: Diagnosis not present

## 2017-08-28 DIAGNOSIS — Z87442 Personal history of urinary calculi: Secondary | ICD-10-CM | POA: Insufficient documentation

## 2017-08-28 DIAGNOSIS — I10 Essential (primary) hypertension: Secondary | ICD-10-CM | POA: Insufficient documentation

## 2017-08-28 DIAGNOSIS — K6289 Other specified diseases of anus and rectum: Secondary | ICD-10-CM | POA: Insufficient documentation

## 2017-08-28 DIAGNOSIS — E785 Hyperlipidemia, unspecified: Secondary | ICD-10-CM | POA: Insufficient documentation

## 2017-08-28 LAB — CBC WITH DIFFERENTIAL/PLATELET
Basophils Absolute: 0 10*3/uL (ref 0–0.1)
Basophils Relative: 0 %
EOS ABS: 0.1 10*3/uL (ref 0–0.7)
Eosinophils Relative: 2 %
HEMATOCRIT: 42.5 % (ref 40.0–52.0)
Hemoglobin: 14.8 g/dL (ref 13.0–18.0)
LYMPHS ABS: 1.6 10*3/uL (ref 1.0–3.6)
Lymphocytes Relative: 29 %
MCH: 31.1 pg (ref 26.0–34.0)
MCHC: 34.9 g/dL (ref 32.0–36.0)
MCV: 89 fL (ref 80.0–100.0)
MONO ABS: 0.5 10*3/uL (ref 0.2–1.0)
MONOS PCT: 9 %
NEUTROS ABS: 3.3 10*3/uL (ref 1.4–6.5)
Neutrophils Relative %: 60 %
Platelets: 159 10*3/uL (ref 150–440)
RBC: 4.77 MIL/uL (ref 4.40–5.90)
RDW: 12.1 % (ref 11.5–14.5)
WBC: 5.6 10*3/uL (ref 3.8–10.6)

## 2017-08-31 NOTE — Progress Notes (Signed)
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 He has been referred to [place] for evaluation and management of polycythemia. Recent CBC from 01/12/2017 showed H&H of 15.5/44, white count of 5.1 and platelet count of 128. Looking back at his prior CBCs, his hemoglobin was 18.4 and 19.1 respectively he also has mild chronic thrombocytopenia and his platelet count in June 2017 was 146.  Patient was on testosterone replacement therapy for 2 years or more until may 2018 when he stopped it after he was diagnosed with prostate cancer. Given his medical problems he has been initiated on ADT for the same as a non surgical option. He had back surgery years ago and was complicated by b/l LE paralysis waist down since. He has chronic rectal pain and has a spinal cord stimulator and pain pump for the same. Reports that he has been feeling weaker since last few months to the point that he cannot stand even for few minutes that he was able to do before. He is a lifelong non smoker. No known lung disease. Reports snoring at night but gets a restful sleep  CBC from 02/05/2017 showed white count of 5.7, H&H of 14.7/40.6 and a platelet count of 156. EPO level was normal and jak 2 testing was negative. CMP showed normal LFTs.CXR normal   Interval history- patient is doing well as compared to prior visit. Reports that his lower extremity strength is improving and he is working  with a Physiological scientist. He continues to be off testosterone and is on ADT for his prostate cancer  ECOG PS- 2 Pain scale- 8 chronic back pain   Review of systems- Review of Systems  Constitutional: Negative for chills, fever, malaise/fatigue and weight loss.  HENT: Negative for congestion, ear discharge and nosebleeds.   Eyes: Negative for blurred vision.  Respiratory: Negative for cough, hemoptysis, sputum production, shortness of breath and wheezing.   Cardiovascular: Negative for chest pain, palpitations, orthopnea and claudication.  Gastrointestinal: Negative for abdominal pain, blood in stool, constipation, diarrhea, heartburn, melena, nausea and vomiting.  Genitourinary: Negative for dysuria, flank pain, frequency, hematuria and urgency.  Musculoskeletal: Positive for back pain. Negative for joint pain and myalgias.  Skin: Negative for rash.  Neurological: Negative for dizziness, tingling, focal weakness, seizures, weakness and headaches.  Endo/Heme/Allergies: Does not bruise/bleed easily.  Psychiatric/Behavioral: Negative for depression and suicidal ideas. The patient does not have insomnia.      Allergies  Allergen Reactions  . Clindamycin Hives  . Doxycycline Rash  . Penicillins Rash  . Sulfamethoxazole Rash     Past Medical History:  Diagnosis Date  . AVM (arteriovenous malformation) spine   . Cancer [facility])    prostate, taking Casodex (hormone chemotherapy)  . History of kidney stones    several times  . Hypertension   . 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 Prostate cancer ([place])   . Status post insertion of intrathecal baclofen pump   . Status post insertion of spinal cord stimulator  Past Surgical History:  Procedure Laterality Date  . COLONOSCOPY    . CYSTOSCOPY     several times  . CYSTOSCOPY WITH DIRECT VISION INTERNAL URETHROTOMY  05/05/2017   Procedure: CYSTOSCOPY WITH DIRECT VISION INTERNAL URETHROTOMY;  Surgeon: Royston Cowper, MD;  Location: [facility];  Service: Urology;;  . Consuela Mimes WITH LITHOLAPAXY N/A 05/05/2017   Procedure: CYSTOSCOPY WITH LITHOLAPAXY;  Surgeon: Royston Cowper, MD;  Location: [facility];  Service: Urology;  Laterality: N/A;  . EXTRACORPOREAL SHOCK WAVE LITHOTRIPSY    . EYE SURGERY Bilateral    cataract surgery  . HOLMIUM LASER APPLICATION N/A 21/30/8657   Procedure: HOLMIUM LASER APPLICATION;  Surgeon: Royston Cowper, MD;  Location: [facility];  Service: Urology;  Laterality: N/A;  . LUMBAR [place] SURGERY  2005   resulted in paralysis of lower extrems    Social History   Socioeconomic History  . Marital status: Married    Spouse name: Not on file  . Number of children: Not on file  . Years of education: Not on file  . Highest education level: Not on file  Social Needs  . Financial resource strain: Not on file  . Food insecurity - worry: Not on file  . Food insecurity - inability: Not on file  . Transportation needs - medical: Not on file  . Transportation needs - non-medical: Not on file  Occupational History  . Not on file  Tobacco Use  . Smoking status: Never Smoker  . Smokeless tobacco: Former Systems developer    Types: Snuff  Substance and Sexual Activity  . Alcohol use: No  . Drug use: No  . Sexual activity: Not on file  Other Topics Concern  . Not on file  Social History Narrative  . Not on file    History reviewed. No pertinent family history.   Current Outpatient Medications:  .  baclofen (LIORESAL) 10 MG tablet, Take 10 mg by mouth daily as needed for muscle spasms. , Disp: , Rfl:  .  bicalutamide (CASODEX) 50 MG tablet, Take 50 mg by mouth daily., Disp: , Rfl:  .  gabapentin (NEURONTIN) 800 MG tablet, Take 800 mg by mouth 3 (three) times daily. , Disp: , Rfl:  .  levofloxacin (LEVAQUIN) 500 MG tablet, Take 1 tablet (500 mg total) by mouth daily., Disp: 3 tablet, Rfl: 0 .  losartan (COZAAR) 100 MG tablet, Take 100 mg by mouth daily., Disp: , Rfl:  .   methenamine (HIPREX) 1 g tablet, Take 1 g by mouth 4 (four) times daily., Disp: , Rfl:  .  mirabegron ER (MYRBETRIQ) 50 MG TB24 tablet, Take 50 mg by mouth daily. , Disp: , Rfl:  .  oxybutynin (DITROPAN-XL) 10 MG 24 hr tablet, Take 10 mg by mouth every morning. , Disp: , Rfl:  .  PAIN MANAGEMENT INTRATHECAL, IT, PUMP, 1 each by Intrathecal route continuous. Intrathecal (IT) medication:  Baclofen, Dilaudid, Disp: , Rfl:  .  polyethylene glycol powder (GLYCOLAX/MIRALAX) powder, Take 17 g by mouth daily. , Disp: , Rfl:  .  Propylene Glycol (SYSTANE BALANCE) 0.6 % SOLN, Place 1 drop into both eyes daily as needed (dry eyes)., Disp: , Rfl:  .  vitamin C (ASCORBIC ACID) 500 MG tablet, Take 1,000 mg by mouth 3 (three) times daily. , Disp: , Rfl:   Physical exam:  Vitals:   08/28/17 1120  BP: (!) 162/87  Pulse: 86  Resp: 16  Temp: 98.2 F (36.8 C)  TempSrc: Tympanic  Weight:  217 lb (98.4 kg)   Physical Exam  Constitutional: He is oriented to person, place, and time.  Patient is sitting in a wheelchair.  Appears in no acute distress  HENT:  Head: Normocephalic and atraumatic.  Eyes: EOM are normal. Pupils are equal, round, and reactive to light.  Neck: Normal range of motion.  Cardiovascular: Normal rate, regular rhythm and normal heart sounds.  Pulmonary/Chest: Effort normal and breath sounds normal.  Abdominal: Soft. Bowel sounds are normal.  Neurological: He is alert and oriented to person, place, and time.  Skin: Skin is warm and dry.     CMP Latest Ref Rng & Units 04/07/2017  Glucose 65 - 99 mg/dL -  BUN 6 - 20 mg/dL -  Creatinine 0.61 - 1.24 mg/dL 0.80  Sodium 135 - 145 mmol/L -  Potassium 3.5 - 5.1 mmol/L -  Chloride 101 - 111 mmol/L -  CO2 22 - 32 mmol/L -  Calcium 8.9 - 10.3 mg/dL -  Total Protein 6.5 - 8.1 g/dL -  Total Bilirubin 0.3 - 1.2 mg/dL -  Alkaline Phos 38 - 126 U/L -  AST 15 - 41 U/L -  ALT 17 - 63 U/L -   CBC Latest Ref Rng & Units 08/28/2017  WBC 3.8 - 10.6  K/uL 5.6  Hemoglobin 13.0 - 18.0 g/dL 14.8  Hematocrit 40.0 - 52.0 % 42.5  Platelets 150 - 440 K/uL 159    No images are attached to the encounter.  No results found.   Assessment and plan- Patient is a 67 y.o. male with secondary polycythemia due to testosterone replacement therapy now resolved  Patient is now off testosterone replacement therapy.  In fact he is on ADT for his prostate cancer.  Since patient got off the testosterone his H&H has stabilized and has remained normal over the last 6 months.  At this time patient can continue to follow-up with his primary care doctor and can be referred in the future if questions or concerns arise   Visit Diagnosis 1. Polycythemia, secondary      Dr. Randa Evens, MD, MPH [facility] at [facility] Pager- [phone] 08/31/2017 12:58 PM

## 2017-11-30 ENCOUNTER — Encounter: Payer: Medicare Other | Attending: Physician Assistant | Admitting: Physician Assistant

## 2017-11-30 DIAGNOSIS — Z993 Dependence on wheelchair: Secondary | ICD-10-CM | POA: Diagnosis not present

## 2017-11-30 DIAGNOSIS — Z8546 Personal history of malignant neoplasm of prostate: Secondary | ICD-10-CM | POA: Insufficient documentation

## 2017-11-30 DIAGNOSIS — L89312 Pressure ulcer of right buttock, stage 2: Secondary | ICD-10-CM | POA: Insufficient documentation

## 2017-11-30 DIAGNOSIS — G8220 Paraplegia, unspecified: Secondary | ICD-10-CM | POA: Diagnosis not present

## 2017-11-30 DIAGNOSIS — I10 Essential (primary) hypertension: Secondary | ICD-10-CM | POA: Diagnosis not present

## 2017-12-02 NOTE — Progress Notes (Signed)
VALE, MOUSSEAU ([id number]) Visit Report for 11/30/2017 Chief Complaint Document Details Patient Name: [name], [name] Date of Service: 11/30/2017 8:00 AM Medical Record Number: [id number] Patient Account Number: [id number] Date of Birth/Sex: 05/14/1951 (67 y.o. M) Treating RN: Roger Shelter Primary Care Provider: [name] Other Clinician: Referring Provider: [name] Treating Provider/Extender: [name], HOYT Weeks in Treatment: 0 Information Obtained from: Patient Chief Complaint Right gluteal fold ulcer Electronic Signature(s) Signed: 11/30/2017 4:12:37 PM By: Worthy Keeler [name] Entered By: Worthy Keeler on 11/30/2017 10:17:21 [name], [name] ([id number]) -------------------------------------------------------------------------------- Debridement Details Patient Name: [name] Date of Service: 11/30/2017 8:00 AM Medical Record Number: [id number] Patient Account Number: [id number] Date of Birth/Sex: 1951/02/12 (67 y.o. M) Treating RN: Roger Shelter Primary Care Provider: [name] Other Clinician: Referring Provider: [name] Treating Provider/Extender: [name], HOYT Weeks in Treatment: 0 Debridement Performed for Wound #1 Right Gluteal fold Assessment: Performed By: Physician [name], HOYT E., [name] Debridement Type: Debridement Pre-procedure Verification/Time Yes - 09:00 Out Taken: Start Time: 09:00 Pain Control: Other : lidocaine 4% Total Area Debrided (L x W): 0.3 (cm) x 10 (cm) = 3 (cm) Tissue and other material Viable, Non-Viable, Eschar, Skin: Epidermis debrided: Level: Skin/Epidermis Debridement Description: Selective/Open Wound Instrument: Curette Bleeding: Minimum Hemostasis Achieved: Pressure End Time: 09:01 Procedural Pain: 0 Post Procedural Pain: 0 Response to Treatment: Procedure was tolerated well Level of Consciousness: Awake and Alert Post Procedure Vitals: Temperature:  98.2 Pulse: 68 Respiratory Rate: 16 Blood Pressure: Systolic Blood Pressure: 734 Diastolic Blood Pressure: 82 Post Debridement Measurements of Total Wound Length: (cm) 4 Stage: Category/Stage II Width: (cm) 3.2 Depth: (cm) 0.1 Volume: (cm) 1.005 Character of Wound/Ulcer Post Stable Debridement: Post Procedure Diagnosis Same as Pre-procedure Electronic Signature(s) Signed: 11/30/2017 4:06:44 PM By: Roger Shelter Signed: 11/30/2017 4:12:37 PM By: Worthy Keeler [name] Entered By: Roger Shelter on 11/30/2017 09:03:31 [name], [name] ([id number]) [name], [name] ([id number]) -------------------------------------------------------------------------------- HPI Details Patient Name: [name] Date of Service: 11/30/2017 8:00 AM Medical Record Number: [id number] Patient Account Number: [id number] Date of Birth/Sex: 07-Nov-1950 (67 y.o. 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 He states that there is no history of diabetes that he is aware of. He does have issues with his prostate and is currently receiving treatment for this by way of oral medication. With that being said I do not have a lot of details in that regard. Nonetheless the patient presents today as a result of having been referred to [place] by another provider initially home health was set to come out and take care of his wound although due to the fact that he apparently drives he's not able to receive home health. His wife is therefore trying to help take  care of this wound within although they have been struggling with what exactly to do at this point. She states that she can do some things but she is definitely not a nurse and does have some issues with looking at blood. The good news is the wound does not appear to be too deep and is fairly superficial at this  point. There is no slough noted there is some nonviable skin noted around the surface of the wound and the perimeter at this point. The central portion of the wound appears to be very good with a dermal layer noted this does not appear to be again deep enough to extend it to subcutaneous tissue at this point. 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 In the pump he has Dilaudid and baclofen. Electronic Signature(s) Signed: 11/30/2017 4:12:37 PM By: Worthy Keeler [name] Entered By: Worthy Keeler on 11/30/2017 10:21:17 [name], [name] ([id number]) -------------------------------------------------------------------------------- Physical Exam Details Patient Name: [name] Date of Service: 11/30/2017 8:00 AM Medical Record Number: [id number] Patient Account Number: [id number] Date of Birth/Sex: 1951/04/17 (67 y.o. M) Treating RN: Roger Shelter Primary Care Provider: [name] Other Clinician: Referring Provider: [name] Treating Provider/Extender: [name], HOYT Weeks in Treatment: 0 Constitutional patient is hypertensive.. pulse regular and within target range for patient.Marland Kitchen respirations regular, non-labored and within target range for patient.Marland Kitchen temperature within target range for patient.. Well-nourished and well-hydrated in no acute distress. Eyes conjunctiva clear no eyelid edema noted. pupils equal round and reactive to light and accommodation. Ears, Nose, Mouth, and Throat no gross abnormality of ear auricles or external auditory canals.  normal hearing noted during conversation. mucus membranes moist. Respiratory normal breathing without difficulty. clear to auscultation bilaterally. Cardiovascular regular rate and rhythm with normal S1, S2. no clubbing, cyanosis, significant edema, <3 sec cap refill. Gastrointestinal (GI) soft, non-tender, non-distended, +BS. no ventral hernia noted. Musculoskeletal Patient unable to walk due to the paraplegia. Psychiatric this patient is able to make decisions and demonstrates good insight into disease process. Alert and Oriented x 3. pleasant and cooperative. Notes Currently the patient seems to be doing excellent in regard to the wound in general except for just around the perimeter where there is some denuded skin that seems to be more of a problem. Fortunately there's not much of this although it likely will need to be cleared away so that new epithelial migration can occur. Fortunately the patient is not having much in the way of pain at this point. I did perform sharp debridement today to remove this nonviable skin from around the border of the wound which he tolerated without complication the majority of it was removed. What was not will slowly remove over the next week hopefully with good wound care and dressing changes. Electronic Signature(s) Signed: 11/30/2017 4:12:37 PM By: Worthy Keeler [name] Entered By: Worthy Keeler on 11/30/2017 10:23:03 [name], [name] ([id number]) -------------------------------------------------------------------------------- Physician Orders Details Patient Name: [name] Date of Service: 11/30/2017 8:00 AM Medical Record Number: [id number] Patient Account Number: [id number] Date of Birth/Sex: August 16, 1950 (67 y.o. M) Treating RN: Roger Shelter Primary Care Provider: [name] Other Clinician: Referring Provider: [name] Treating Provider/Extender: [name], HOYT Weeks in Treatment: 0 Verbal / Phone Orders:  No Diagnosis Coding Wound Cleansing Wound #1 Right Gluteal fold o Clean wound with Normal Saline. Anesthetic (add to Medication List) Wound #1 Right Gluteal fold o Topical Lidocaine 4% cream applied to wound bed prior to debridement (In Clinic Only). Skin Barriers/Peri-Wound Care Wound #1 Right Gluteal fold o Skin Prep Primary Wound Dressing Wound #1 Right Gluteal fold o Hydrogel o Silver Collagen Secondary Dressing o Boardered Foam Dressing Dressing Change Frequency Wound #1 Right Gluteal fold o Change dressing every other day. Follow-up  Appointments Wound #1 Right Gluteal fold o Return Appointment in 1 week. Off-Loading Wound #1 Right Gluteal fold o Turn and reposition every 2 hours Electronic Signature(s) Signed: 11/30/2017 4:06:44 PM By: Roger Shelter Signed: 11/30/2017 4:12:37 PM By: Worthy Keeler [name] Entered By: Roger Shelter on 11/30/2017 08:59:05 [name], [name] ([id number]) -------------------------------------------------------------------------------- Problem List Details Patient Name: [name] Date of Service: 11/30/2017 8:00 AM Medical Record Number: [id number] Patient Account Number: [id number] Date of Birth/Sex: Dec 06, 1950 (67 y.o. 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, stage 2 11/30/2017 Yes S34.109A Unspecified injury to unspecified level of lumbar spinal cord, 11/30/2017 Yes initial encounter [place] (primary) hypertension 11/30/2017 Yes Inactive Problems Resolved Problems Electronic Signature(s) Signed: 11/30/2017 4:12:37 PM By: Worthy Keeler [name] Entered By: Worthy Keeler on 11/30/2017 10:17:04 [name] ([id number]) -------------------------------------------------------------------------------- Progress Note Details Patient Name: [name] Date of Service: 11/30/2017 8:00 AM Medical Record Number: [id number] Patient Account Number: [id number] Date of Birth/Sex: [birth year] (67 y.o. 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 He states that there is no history of diabetes that he is aware of. He does have issues with his prostate and is currently receiving treatment for this by way of oral medication. With that being said I do not have a lot of details in that regard. Nonetheless the patient presents today as a result of having been referred to [place] by another provider initially home health was set to come out and take care of his wound although due to the fact that he apparently drives he's not able to receive home health. His wife is therefore trying to help take care of this wound within although they have been struggling with what exactly to do at this point. She states that she can do some things but she is definitely not a nurse and does have some issues with looking at blood. The good news is the wound does not appear to be too deep and is fairly superficial at this point. There is no slough noted there is some nonviable skin noted around the surface of the wound and the perimeter at this point. The  central portion of the wound appears to be very good with a dermal layer noted this does not appear to be again deep enough to extend it to subcutaneous tissue at this point. 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 In the pump he has Dilaudid and baclofen. Wound History Patient presents with 1 open wound that has been present for approximately 1 month. Patient has been treating wound in the following manner: cream. Laboratory tests have not been performed in the last month. Patient reportedly has not tested positive for an antibiotic  resistant organism. Patient reportedly has not tested positive for osteomyelitis. Patient reportedly has not had testing performed to evaluate circulation in the legs. Patient History Information obtained from Patient, Chart. Allergies penicillin (Reaction: Rash), doxycycline (Reaction: rash) Family History Hypertension - Father, Stroke - Mother, No family history of Cancer, Diabetes, Heart Disease, Kidney Disease, Lung Disease, Seizures, Thyroid Problems, Tuberculosis. Social History Never smoker, Marital Status - Married, Alcohol Use - Never, Drug Use - No History, Caffeine Use - Daily. Medical History Eyes Patient has history of Cataracts - both removed Denies history of Glaucoma, Optic Neuritis Ear/Nose/Mouth/Throat BARKLEY, [place] ([id number]) Denies history of Chronic sinus problems/congestion, Middle ear problems Hematologic/Lymphatic Denies history of Anemia, Hemophilia, Human Immunodeficiency Virus, Lymphedema Respiratory Denies history of Aspiration, Asthma, Chronic Obstructive Pulmonary Disease (COPD), Pneumothorax, Sleep Apnea, Tuberculosis Cardiovascular Patient has history of Hypertension - takes medication Denies history of Angina, Arrhythmia, Congestive Heart Failure, Coronary Artery Disease, Deep Vein Thrombosis, Hypotension,  Myocardial Infarction, Peripheral Arterial Disease, Peripheral Venous Disease, Phlebitis, Vasculitis Gastrointestinal Denies history of Cirrhosis , Colitis, Crohn s, Hepatitis A, Hepatitis B, Hepatitis C Endocrine Denies history of Type I Diabetes, Type II Diabetes Genitourinary Denies history of End Stage Renal Disease Immunological Denies history of Lupus Erythematosus, Raynaud s, Scleroderma Integumentary (Skin) Denies history of History of Burn, History of pressure wounds Musculoskeletal Denies history of Gout, Rheumatoid Arthritis, Osteoarthritis, Osteomyelitis Neurologic Patient has history of Paraplegia - waist down Denies history of Dementia, Neuropathy, Quadriplegia, Seizure Disorder Oncologic Denies history of Received Chemotherapy, Received Radiation Psychiatric Denies history of Anorexia/bulimia, Confinement Anxiety Medical And Surgical History Notes Oncologic Prostate cancer- currently treated with horomone therapy Review of Systems (ROS) Constitutional Symptoms (General Health) The patient has no complaints or symptoms. Eyes Complains or has symptoms of Glasses / Contacts. Denies complaints or symptoms of Dry Eyes, Vision Changes. Ear/Nose/Mouth/Throat The patient has no complaints or symptoms. Hematologic/Lymphatic The patient has no complaints or symptoms. Respiratory The patient has no complaints or symptoms. Cardiovascular The patient has no complaints or symptoms. Gastrointestinal The patient has no complaints or symptoms. Endocrine The patient has no complaints or symptoms. Genitourinary The patient has no complaints or symptoms, Kidney Stones and Self Cath Immunological The patient has no complaints or symptoms. Integumentary (Skin) Complains or has symptoms of Wounds. Denies complaints or symptoms of Bleeding or bruising tendency, Breakdown, Swelling. Musculoskeletal [name], [name]. ([id number]) Complains or has symptoms of Muscle Weakness -  legs. Denies complaints or symptoms of Muscle Pain. Neurologic The patient has no complaints or symptoms. Oncologic The patient has no complaints or symptoms. Psychiatric The patient has no complaints or symptoms. Objective Constitutional patient is hypertensive.. pulse regular and within target range for patient.Marland Kitchen respirations regular, non-labored and within target range for patient.Marland Kitchen temperature within target range for patient.. Well-nourished and well-hydrated in no acute distress. Vitals Time Taken: 8:11 AM, Height: 73 in, Weight: 210 lbs, BMI: 27.7, Temperature: 98.2 F, Pulse: 68 bpm, Respiratory Rate: 16 breaths/min, Blood Pressure: 138/82 mmHg. Eyes conjunctiva clear no eyelid edema noted. pupils equal round and reactive to light and accommodation. Ears, Nose, Mouth, and Throat no gross abnormality of ear auricles or external auditory canals. normal hearing noted during conversation. mucus membranes moist. Respiratory normal breathing without difficulty. clear to auscultation bilaterally. Cardiovascular regular rate and rhythm with normal S1, S2. no clubbing, cyanosis, significant edema, Gastrointestinal (GI) soft, non-tender, non-distended, +BS. no ventral hernia noted. Musculoskeletal Patient unable to walk due to the paraplegia. Psychiatric this patient is able to make decisions and  demonstrates good insight into disease process. Alert and Oriented x 3. pleasant and cooperative. General Notes: Currently the patient seems to be doing excellent in regard to the wound in general except for just around the perimeter where there is some denuded skin that seems to be more of a problem. Fortunately there's not much of this although it likely will need to be cleared away so that new epithelial migration can occur. Fortunately the patient is not having much in the way of pain at this point. I did perform sharp debridement today to remove this nonviable skin from around the border  of the wound which he tolerated without complication the majority of it was removed. What was not will slowly remove over the next week hopefully with good wound care and dressing changes. Integumentary (Hair, Skin) Wound #1 status is Open. Original cause of wound was Pressure Injury. The wound is located on the Right Gluteal fold. The [name], [name] ([id number]) wound measures 4cm length x 3.2cm width x 0.1cm depth; 10.053cm^2 area and 1.005cm^3 volume. There is Fat Layer (Subcutaneous Tissue) Exposed exposed. There is no tunneling or undermining noted. There is a none present amount of drainage noted. The wound margin is flat and intact. There is medium (34-66%) pink granulation within the wound bed. There is a small (1-33%) amount of necrotic tissue within the wound bed including Adherent Slough. The periwound skin appearance exhibited: Dry/Scaly. The periwound skin appearance did not exhibit: Callus, Crepitus, Excoriation, Induration, Rash, Scarring, Maceration, Atrophie Blanche, Cyanosis, Ecchymosis, Hemosiderin Staining, Mottled, Pallor, Rubor, Erythema. Assessment Active Problems ICD-10 G82.20 - Paraplegia, unspecified L89.312 - Pressure ulcer of right buttock, stage 2 S34.109A - Unspecified injury to unspecified level of lumbar spinal cord, initial encounter I10 - Ess[place] (primary) hypertension Procedures Wound #1 Pre-procedure diagnosis of Wound #1 is a Pressure Ulcer located on the Right Gluteal fold . There was a Selective/Open Wound Skin/Epidermis Debridement with a total area of 3 sq cm performed by [name], HOYT E., [name]. With the following instrument(s): Curette to remove Viable and Non-Viable tissue/material. Material removed includes Eschar and Skin: Epidermis and after achieving pain control using Other (lidocaine 4%). No specimens were taken. A time out was conducted at 09:00, prior to the start of the procedure. A Minimum amount of bleeding was controlled with  Pressure. The procedure was tolerated well with a pain level of 0 throughout and a pain level of 0 following the procedure. Patient s Level of Consciousness post procedure was recorded as Awake and Alert and post-procedure vitals were taken including Temperature: 98.2 F, Pulse: 68 bpm, Respiratory Rate: 16 breaths/min, Blood Pressure: (138)/(82) mmHg. Post Debridement Measurements: 4cm length x 3.2cm width x 0.1cm depth; 1.005cm^3 volume. Post debridement Stage noted as Category/Stage II. Character of Wound/Ulcer Post Debridement is stable. Post procedure Diagnosis Wound #1: Same as Pre-Procedure Plan Wound Cleansing: Wound #1 Right Gluteal fold: Clean wound with Normal Saline. Anesthetic (add to Medication List): Wound #1 Right Gluteal fold: Topical Lidocaine 4% cream applied to wound bed prior to debridement (In Clinic Only). Skin Barriers/Peri-Wound Care: Wound #1 Right Gluteal fold: Skin Prep Primary Wound Dressing: [name], [name] ([id number]) Wound #1 Right Gluteal fold: Hydrogel Silver Collagen Secondary Dressing: Boardered Foam Dressing Dressing Change Frequency: Wound #1 Right Gluteal fold: Change dressing every other day. Follow-up Appointments: Wound #1 Right Gluteal fold: Return Appointment in 1 week. Off-Loading: Wound #1 Right Gluteal fold: Turn and reposition every 2 hours At this point my suggestion is going to be  to initiate treatment with Prisma for the next week. We will cover this with hydrogel and then a Boarder Foam Dressing to help with moisture. He is in agreement with plan. We will see were things stand in one weeks time. I did suggest proper offloading at this point today as well and to be sure to avoid any type of friction type injury. Patient understands and both respect. Please see above for specific wound care orders. We will see patient for re-evaluation in 1 week(s) here in the clinic. If anything worsens or changes patient will contact our  office for additional recommendations. Electronic Signature(s) Signed: 11/30/2017 4:12:37 PM By: Worthy Keeler [name] Entered By: Worthy Keeler on 11/30/2017 10:24:00 [name], [name] ([id number]) -------------------------------------------------------------------------------- ROS/PFSH Details Patient Name: [name] Date of Service: 11/30/2017 8:00 AM Medical Record Number: [id number] Patient Account Number: [id number] Date of Birth/Sex: 1950-08-04 (67 y.o. M) Treating RN: [name] Primary Care Provider: [name] Other Clinician: Referring Provider: [name] Treating Provider/Extender: [name], HOYT Weeks in Treatment: 0 Information Obtained From Patient Chart Wound History Do you currently have one or more open woundso Yes How many open wounds do you currently haveo 1 Approximately how long have you had your woundso 1 month How have you been treating your wound(s) until nowo cream Has your wound(s) ever healed and then re-openedo No Have you had any lab work done in the past montho No Have you tested positive for an antibiotic resistant organism (MRSA, VRE)o No Have you tested positive for osteomyelitis (bone infection)o No Have you had any tests for circulation on your legso No Eyes Complaints and Symptoms: Positive for: Glasses / Contacts Negative for: Dry Eyes; Vision Changes Medical History: Positive for: Cataracts - both removed Negative for: Glaucoma; Optic Neuritis Integumentary (Skin) Complaints and Symptoms: Positive for: Wounds Negative for: Bleeding or bruising tendency; Breakdown; Swelling Medical History: Negative for: History of Burn; History of pressure wounds Musculoskeletal Complaints and Symptoms: Positive for: Muscle Weakness - legs Negative for: Muscle Pain Medical History: Negative for: Gout; Rheumatoid Arthritis; Osteoarthritis; Osteomyelitis Neurologic Complaints and Symptoms: No Complaints or  Symptoms Complaints and Symptoms: Negative for: Numbness/parasthesias; Focal/Weakness Medical History: ROOSVELT, [place]UR[place] ([id number]) Positive for: Paraplegia - waist down Negative for: Dementia; Neuropathy; Quadriplegia; Seizure Disorder Constitutional Symptoms (General Health) Complaints and Symptoms: No Complaints or Symptoms Ear/Nose/Mouth/Throat Complaints and Symptoms: No Complaints or Symptoms Medical History: Negative for: Chronic sinus problems/congestion; Middle ear problems Hematologic/Lymphatic Complaints and Symptoms: No Complaints or Symptoms Medical History: Negative for: Anemia; Hemophilia; Human Immunodeficiency Virus; Lymphedema Respiratory Complaints and Symptoms: No Complaints or Symptoms Medical History: Negative for: Aspiration; Asthma; Chronic Obstructive Pulmonary Disease (COPD); Pneumothorax; Sleep Apnea; Tuberculosis Cardiovascular Complaints and Symptoms: No Complaints or Symptoms Medical History: Positive for: Hypertension - takes medication Negative for: Angina; Arrhythmia; Congestive Heart Failure; Coronary Artery Disease; Deep Vein Thrombosis; Hypotension; Myocardial Infarction; Peripheral Arterial Disease; Peripheral Venous Disease; Phlebitis; Vasculitis Gastrointestinal Complaints and Symptoms: No Complaints or Symptoms Medical History: Negative for: Cirrhosis ; Colitis; Crohnos; Hepatitis A; Hepatitis B; Hepatitis C Endocrine Complaints and Symptoms: No Complaints or Symptoms Medical History: Negative for: Type I Diabetes; Type II Diabetes EMIEL, KIELTY. ([id number]) Genitourinary Complaints and Symptoms: No Complaints or Symptoms Complaints and Symptoms: Review of System Notes: Kidney Stones and Self Cath Medical History: Negative for: End Stage Renal Disease Immunological Complaints and Symptoms: No Complaints or Symptoms Medical History: Negative for: Lupus Erythematosus; Raynaudos; Scleroderma Oncologic Complaints  and Symptoms: No Complaints or Symptoms Medical  History: Negative for: Received Chemotherapy; Received Radiation Past Medical History Notes: Prostate cancer- currently treated with horomone therapy Psychiatric Complaints and Symptoms: No Complaints or Symptoms Medical History: Negative for: Anorexia/bulimia; Confinement Anxiety HBO Extended History Items Eyes: Cataracts Immunizations Pneumococcal Vaccine: Received Pneumococcal Vaccination: No Implantable Devices Family and Social History Cancer: No; Diabetes: No; Heart Disease: No; Hypertension: Yes - Father; Kidney Disease: No; Lung Disease: No; Seizures: No; Stroke: Yes - Mother; Thyroid Problems: No; Tuberculosis: No; Never smoker; Marital Status - Married; Alcohol Use: Never; Drug Use: No History; Caffeine Use: Daily; Advanced Directives: Yes (Copy provided); Living Will: Yes (Copy provided) Electronic Signature(s) Signed: 11/30/2017 4:12:37 PM By: Worthy Keeler [name] Signed: 12/01/2017 5:51:43 PM By: Gretta Cool, BSN, RN, CWS, Kim RN, BSN Entered By: Gretta Cool, BSN, RN, CWS, Kim on 11/30/2017 08:21:18 TAJAY, MUZZY ([id number]) WINNIE, BARSKY ([id number]) -------------------------------------------------------------------------------- SuperBill Details Patient Name: [name] Date of Service: 11/30/2017 Medical Record Number: [id number] Patient Account Number: [id number] Date of Birth/Sex: 1951/04/09 (67 y.o. 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, stage 2 S34.109A Unspecified injury to unspecified level of lumbar spinal cord, initial encounter [place] (primary) hypertension Facility Procedures CPT4 Code: [id number] Description:  [phone] - WOUND CARE VISIT-LEV 2 EST PT Modifier: Quantity: 1 CPT4 Code: [id number] Description: 42353 - DEBRIDE WOUND 1ST 20 SQ CM OR < ICD-10 Diagnosis Description L89.312 Pressure ulcer of right buttock, stage 2 Modifier: Quantity: 1 Physician Procedures CPT4 Code Description: [id number] WC PHYS LEVEL 3 o NEW PT ICD-10 Diagnosis Description G82.20 Paraplegia, unspecified 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

## 2017-12-02 NOTE — Progress Notes (Signed)
BROCKTON, MCKESSON ([id number]) Visit Report for 11/30/2017 Abuse/Suicide Risk Screen Details Patient Name: [name], [name] Date of Service: 11/30/2017 8:00 AM Medical Record Number: [id number] Patient Account Number: [id number] Date of Birth/Sex: September 01, 1950 (67 y.o. M) Treating RN: [name] Primary Care Lucious Zou: [name] Other Clinician: Referring Jeremy Ditullio: [name] Treating Kamira Mellette/Extender: [name], HOYT Weeks in Treatment: 0 Abuse/Suicide Risk Screen Items Answer ABUSE/SUICIDE RISK SCREEN: Has anyone close to you tried to hurt or harm you recentlyo No Do you feel uncomfortable with anyone in your familyo No Has anyone forced you do things that you didnot want to doo No Do you have any thoughts of harming yourselfo No Patient displays signs or symptoms of abuse and/or neglect. No Electronic Signature(s) Signed: 12/01/2017 5:51:43 PM By: Gretta Cool, BSN, RN, CWS, Kim RN, BSN Entered By: Gretta Cool, BSN, RN, CWS, Kim on 11/30/2017 08:21:30 HEATON, SARIN ([id number]) -------------------------------------------------------------------------------- Activities of Daily Living Details Patient Name: [name] Date of Service: 11/30/2017 8:00 AM Medical Record Number: [id number] Patient Account Number: [id number] Date of Birth/Sex: Jan 24, 1951 (67 y.o. M) Treating RN: [name] Primary Care Jaxyn Rout: [name] Other Clinician: Referring Reinhardt Licausi: [name] Treating Romie Tay/Extender: [name], HOYT Weeks in Treatment: 0 Activities of Daily Living Items Answer Activities of Daily Living (Please select one for each item) Drive Automobile Completely Able Take Medications Completely Able Use Telephone Completely Able Care for Appearance Completely Able Use Toilet Completely Able Bath / Shower Completely Able Dress Self Completely Able Feed Self Completely Able Walk Completely Able Get In / Out Bed Completely Able Housework Completely  Able Prepare Meals Completely [place] for Self Completely Able Electronic Signature(s) Signed: 12/01/2017 5:51:43 PM By: Gretta Cool, BSN, RN, CWS, Kim RN, BSN Entered By: Gretta Cool, BSN, RN, CWS, Kim on 11/30/2017 08:21:42 LELON, IKARD ([id number]) -------------------------------------------------------------------------------- Education Assessment Details Patient Name: [name] Date of Service: 11/30/2017 8:00 AM Medical Record Number: [id number] Patient Account Number: [id number] Date of Birth/Sex: Dec 19, 1950 (67 y.o. M) Treating RN: [name] Primary Care Immaculate Crutcher: [name] Other Clinician: Referring Kamarah Bilotta: [name] Treating Jermond Burkemper/Extender: [name], HOYT Weeks in Treatment: 0 Primary Learner Assessed: Patient Learning Preferences/Education Level/Primary Language Learning Preference: Explanation, Demonstration Highest Education Level: High School Preferred Language: English Cognitive Barrier Assessment/Beliefs Language Barrier: No Translator Needed: No Memory Deficit: No Emotional Barrier: No Cultural/Religious Beliefs Affecting Medical Care: No Physical Barrier Assessment Impaired Vision: Yes Glasses Impaired Hearing: No Decreased Hand dexterity: No Knowledge/Comprehension Assessment Knowledge Level: High Comprehension Level: High Ability to understand written High instructions: Ability to understand verbal High instructions: Motivation Assessment Anxiety Level: Calm Cooperation: Cooperative Education Importance: Acknowledges Need Interest in Health Problems: Asks Questions Perception: Coherent Willingness to Engage in Self- High Management Activities: Readiness to Engage in Self- High Management Activities: Electronic Signature(s) Signed: 12/01/2017 5:51:43 PM By: Gretta Cool, BSN, RN, CWS, Kim RN, BSN Entered By: Gretta Cool, BSN, RN, CWS, Kim on 11/30/2017 08:22:18 JACOBS, GOLAB  ([id number]) -------------------------------------------------------------------------------- Fall Risk Assessment Details Patient Name: [name] Date of Service: 11/30/2017 8:00 AM Medical Record Number: [id number] Patient Account Number: [id number] Date of Birth/Sex: 25-Jun-1951 (67 y.o. M) Treating RN: [name] Primary Care Naylani Bradner: [name] Other Clinician: Referring Carline Dura: [name] Treating Beatriz Settles/Extender: [name], HOYT Weeks in Treatment: 0 Fall Risk Assessment Items Have you had 2 or more falls in the last 12 monthso 0 No Have you had any fall that resulted in injury in the last 12 monthso 0 No FALL RISK ASSESSMENT: History  of falling - immediate or within 3 months 0 No Secondary diagnosis 0 No Ambulatory aid None/bed rest/wheelchair/nurse 0 Yes Crutches/cane/walker 0 No Furniture 0 No IV Access/Saline Lock 0 No Gait/Training Normal/bed rest/immobile 0 No Weak 0 No Impaired 0 No Mental Status Oriented to own ability 0 No Electronic Signature(s) Signed: 12/01/2017 5:51:43 PM By: Gretta Cool, BSN, RN, CWS, Kim RN, BSN Entered By: Gretta Cool, BSN, RN, CWS, Kim on 11/30/2017 08:22:43 NAKOTA, ELSEN ([id number]) -------------------------------------------------------------------------------- Foot Assessment Details Patient Name: [name] Date of Service: 11/30/2017 8:00 AM Medical Record Number: [id number] Patient Account Number: [id number] Date of Birth/Sex: 20-Apr-1951 (67 y.o. M) Treating RN: [name] Primary Care Olubunmi Rothenberger: [name] Other Clinician: Referring Mandell Pangborn: [name] Treating Sevannah Madia/Extender: [name], HOYT Weeks in Treatment: 0 Foot Assessment Items Site Locations + = Sensation present, - = Sensation absent, C = Callus, U = Ulcer R = Redness, W = Warmth, M = Maceration, PU = Pre-ulcerative lesion F = Fissure, S = Swelling, D = Dryness Assessment Right: Left: Other Deformity: No No Prior Foot  Ulcer: No No Prior Amputation: No No Charcot Joint: No No Ambulatory Status: Non-ambulatory Assistance Device: Wheelchair Gait: Engineer, maintenance) Signed: 12/01/2017 5:51:43 PM By: Gretta Cool, BSN, RN, CWS, Kim RN, BSN Entered By: Gretta Cool, BSN, RN, CWS, Kim on 11/30/2017 08:23:11 KYRE, JEFFRIES ([id number]) -------------------------------------------------------------------------------- Nutrition Risk Assessment Details Patient Name: [name] Date of Service: 11/30/2017 8:00 AM Medical Record Number: [id number] Patient Account Number: [id number] Date of Birth/Sex: 06/14/1951 (67 y.o. M) Treating RN: [name] Primary Care Tieara Flitton: [name] Other Clinician: Referring Juvon Teater: [name] Treating Kameelah Minish/Extender: [name], HOYT Weeks in Treatment: 0 Height (in): 73 Weight (lbs): 210 Body Mass Index (BMI): 27.7 Nutrition Risk Assessment Items NUTRITION RISK SCREEN: I have an illness or condition that made me change the kind and/or amount of 0 No food I eat I eat fewer than two meals per day 0 No I eat few fruits and vegetables, or milk products 0 No I have three or more drinks of beer, liquor or wine almost every day 0 No I have tooth or mouth problems that make it hard for me to eat 0 No I don't always have enough money to buy the food I need 0 No I eat alone most of the time 0 No I take three or more different prescribed or over-the-counter drugs a day 0 No Without wanting to, I have lost or gained 10 pounds in the last six months 0 No I am not always physically able to shop, cook and/or feed myself 0 No Nutrition Protocols Good Risk Protocol 0 No interventions needed Moderate Risk Protocol Electronic Signature(s) Signed: 12/01/2017 5:51:43 PM By: Gretta Cool, BSN, RN, CWS, Kim RN, BSN Entered By: Gretta Cool, BSN, RN, CWS, Kim on 11/30/2017 08:22:54

## 2017-12-02 NOTE — Progress Notes (Addendum)
[name], [name] ([id number]) Visit Report for 11/30/2017 Allergy List Details Patient Name: [name], [name] Date of Service: 11/30/2017 8:00 AM Medical Record Number: [id number] Patient Account Number: [id number] Date of Birth/Sex: 1951/01/30 (67 y.o. M) Treating RN: [name] Primary Care Tashya Alberty: [name] Other Clinician: Referring Argyle Gustafson: [name] Treating Hersey Maclellan/Extender: [name], HOYT Weeks in Treatment: 0 Allergies Active Allergies penicillin Reaction: Rash doxycycline Reaction: rash Allergy Notes Electronic Signature(s) Signed: 12/01/2017 5:51:43 PM By: Gretta Cool, BSN, RN, CWS, Kim RN, BSN Entered By: Gretta Cool, BSN, RN, CWS, Kim on 11/30/2017 08:13:59 [name], [name] ([id number]) -------------------------------------------------------------------------------- Arrival Information Details Patient Name: [name] Date of Service: 11/30/2017 8:00 AM Medical Record Number: [id number] Patient Account Number: [id number] Date of Birth/Sex: 12/19/1950 (67 y.o. M) Treating RN: [name] Primary Care Covey Baller: [name] Other Clinician: Referring Kamaal Cast: [name] Treating Camey Edell/Extender: [name], HOYT Weeks in Treatment: 0 Visit Information Patient Arrived: Wheel Chair Arrival Time: 08:10 Accompanied By: wife Transfer Assistance: None Patient Identification Verified: Yes Secondary Verification Process Completed: Yes Patient Requires Transmission-Based No Precautions: Patient Has Alerts: Yes Patient Alerts: NOT Diabetic Electronic Signature(s) Signed: 12/01/2017 5:51:43 PM By: Gretta Cool, BSN, RN, CWS, Kim RN, BSN Entered By: Gretta Cool, BSN, RN, CWS, Kim on 11/30/2017 08:10:48 [name], [name] ([id number]) -------------------------------------------------------------------------------- Clinic Level of Care Assessment Details Patient Name: [name] Date of Service: 11/30/2017 8:00 AM Medical Record Number:  [id number] Patient Account Number: [id number] Date of Birth/Sex: 1950/11/04 (67 y.o. M) Treating RN: Roger Shelter Primary Care Collen Hostler: [name] Other Clinician: Referring Adriyanna Christians: [name] Treating Guliana Weyandt/Extender: [name], HOYT Weeks in Treatment: 0 Clinic Level of Care Assessment Items TOOL 1 Quantity Score X - Use when EandM and Procedure is performed on INITIAL visit 1 0 ASSESSMENTS - Nursing Assessment / Reassessment X - General Physical Exam (combine w/ comprehensive assessment (listed just below) when 1 20 performed on new pt. evals) X- 1 25 Comprehensive Assessment (HX, ROS, Risk Assessments, Wounds Hx, etc.) ASSESSMENTS - Wound and Skin Assessment / Reassessment []  - Dermatologic / Skin Assessment (not related to wound area) 0 ASSESSMENTS - Ostomy and/or Continence Assessment and Care []  - Incontinence Assessment and Management 0 []  - 0 Ostomy Care Assessment and Management (repouching, etc.) PROCESS - Coordination of Care X - Simple Patient / Family Education for ongoing care 1 15 []  - 0 Complex (extensive) Patient / Family Education for ongoing care []  - 0 Staff obtains Programmer, systems, Records, Test Results / Process Orders []  - 0 Staff telephones HHA, Nursing Homes / Clarify orders / etc []  - 0 Routine Transfer to another Facility (non-emergent condition) []  - 0 Routine Hospital Admission (non-emergent condition) []  - 0 New Admissions / Biomedical engineer / Ordering NPWT, Apligraf, etc. []  - 0 Emergency Hospital Admission (emergent condition) PROCESS - Special Needs []  - Pediatric / Minor Patient Management 0 []  - 0 Isolation Patient Management []  - 0 Hearing / Language / Visual special needs []  - 0 Assessment of Community assistance (transportation, D/C planning, etc.) []  - 0 Additional assistance / Altered mentation []  - 0 Support Surface(s) Assessment (bed, cushion, seat, etc.) [name], [name]. ([id number]) INTERVENTIONS -  Miscellaneous []  - External ear exam 0 []  - 0 Patient Transfer (multiple staff / Civil Service fast streamer / Similar devices) []  - 0 Simple Staple / Suture removal (25 or less) []  - 0 Complex Staple / Suture removal (26 or more) []  - 0 Hypo/Hyperglycemic Management (do not check if billed separately) []  - 0 Ankle / Brachial  Index (ABI) - do not check if billed separately Has the patient been seen at the hospital within the last three years: Yes Total Score: 60 Level Of Care: New/Established - Level 2 Electronic Signature(s) Signed: 11/30/2017 4:06:44 PM By: Roger Shelter Entered By: Roger Shelter on 11/30/2017 09:04:10 [name] ([id number]) -------------------------------------------------------------------------------- Encounter Discharge Information Details Patient Name: [name] Date of Service: 11/30/2017 8:00 AM Medical Record Number: [id number] Patient Account Number: [id number] Date of Birth/Sex: 05-May-1951 (67 y.o. M) Treating RN: Ahmed Prima Primary Care Sorayah Schrodt: [name] Other Clinician: Referring Yaron Grasse: [name] Treating Evanee Lubrano/Extender: [name], HOYT Weeks in Treatment: 0 Encounter Discharge Information Items Discharge Condition: Stable Ambulatory Status: Wheelchair Discharge Destination: Home Transportation: Private Auto Accompanied By: wife Schedule Follow-up Appointment: No Clinical Summary of Care: Electronic Signature(s) Signed: 11/30/2017 3:58:02 PM By: Alric Quan Entered By: Alric Quan on 11/30/2017 09:09:37 [name] ([id number]) -------------------------------------------------------------------------------- Lower Extremity Assessment Details Patient Name: [name] Date of Service: 11/30/2017 8:00 AM Medical Record Number: [id number] Patient Account Number: [id number] Date of Birth/Sex: 1951-02-06 (67 y.o. M) Treating RN: [name] Primary Care Kyzen Horn: [name] Other  Clinician: Referring Daisean Brodhead: [name] Treating Alessandra Sawdey/Extender: Sharalyn Ink in Treatment: 0 Electronic Signature(s) Signed: 12/01/2017 5:51:43 PM By: Gretta Cool, BSN, RN, CWS, Kim RN, BSN Entered By: Gretta Cool, BSN, RN, CWS, Kim on 11/30/2017 08:13:14 [name], [name] ([id number]) -------------------------------------------------------------------------------- Multi Wound Chart Details Patient Name: [name] Date of Service: 11/30/2017 8:00 AM Medical Record Number: [id number] Patient Account Number: [id number] Date of Birth/Sex: 1950/10/21 (67 y.o. M) Treating RN: Roger Shelter Primary Care Evely Gainey: [name] Other Clinician: Referring Doylene Splinter: [name] Treating Harvie Morua/Extender: [name], HOYT Weeks in Treatment: 0 Vital Signs Height(in): 73 Pulse(bpm): 57 Weight(lbs): 210 Blood Pressure(mmHg): 138/82 Body Mass Index(BMI): 28 Temperature(F): 98.2 Respiratory Rate 16 (breaths/min): Photos: [1:No Photos] [N/A:N/A] Wound Location: [1:Right Gluteal fold] [N/A:N/A] Wounding Event: [1:Pressure Injury] [N/A:N/A] Primary Etiology: [1:Pressure Ulcer] [N/A:N/A] Comorbid History: [1:Cataracts, Hypertension, Paraplegia] [N/A:N/A] Date Acquired: [1:11/02/2017] [N/A:N/A] Weeks of Treatment: [1:0] [N/A:N/A] Wound Status: [1:Open] [N/A:N/A] Measurements L x W x D [1:4x3.2x0.1] [N/A:N/A] (cm) Area (cm) : [1:10.053] [N/A:N/A] Volume (cm) : [1:1.005] [N/A:N/A] % Reduction in Area: [1:0.00%] [N/A:N/A] % Reduction in Volume: [1:0.00%] [N/A:N/A] Classification: [1:Category/Stage II] [N/A:N/A] Exudate Amount: [1:None Present] [N/A:N/A] Wound Margin: [1:Flat and Intact] [N/A:N/A] Granulation Amount: [1:Medium (34-66%)] [N/A:N/A] Granulation Quality: [1:Pink] [N/A:N/A] Necrotic Amount: [1:Small (1-33%)] [N/A:N/A] Exposed Structures: [1:Fat Layer (Subcutaneous Tissue) Exposed: Yes Fascia: No Tendon: No Muscle: No Joint: No Bone: No]  [N/A:N/A] Epithelialization: [1:Small (1-33%)] [N/A:N/A] Periwound Skin Texture: [1:Excoriation: No Induration: No Callus: No Crepitus: No Rash: No Scarring: No] [N/A:N/A] Periwound Skin Moisture: [1:Dry/Scaly: Yes Maceration: No] [N/A:N/A] Periwound Skin Color: Atrophie Blanche: No N/A N/A Cyanosis: No Ecchymosis: No Erythema: No Hemosiderin Staining: No Mottled: No Pallor: No Rubor: No Tenderness on Palpation: No N/A N/A Wound Preparation: Ulcer Cleansing: N/A N/A Rinsed/Irrigated with Saline Topical Anesthetic Applied: None Treatment Notes Electronic Signature(s) Signed: 11/30/2017 4:06:44 PM By: Roger Shelter Entered By: Roger Shelter on 11/30/2017 08:52:56 [name] ([id number]) -------------------------------------------------------------------------------- [place] Details Patient Name: [name] Date of Service: 11/30/2017 8:00 AM Medical Record Number: [id number] Patient Account Number: [id number] Date of Birth/Sex: [birth year] (67 y.o. M) Treating RN: Roger Shelter Primary Care Yiannis Tulloch: [name] Other Clinician: Referring Jawana Reagor: [name] Treating Manali Mcelmurry/Extender: [name], HOYT Weeks in Treatment: 0 Active Inactive ` Orientation to the Wound Care Program Nursing Diagnoses: Knowledge deficit related to the wound healing center  program Goals: Patient/caregiver will verbalize understanding of the [place] Date Initiated: 11/30/2017 Target Resolution Date: 12/21/2017 Goal Status: Active Interventions: Provide education on orientation to the wound center Notes: ` Wound/Skin Impairment Nursing Diagnoses: Impaired tissue integrity Goals: Patient/caregiver will verbalize understanding of skin care regimen Date Initiated: 11/30/2017 Target Resolution Date: 12/21/2017 Goal Status: Active Ulcer/skin breakdown will have a volume reduction of 30% by week 4 Date Initiated:  11/30/2017 Target Resolution Date: 12/21/2017 Goal Status: Active Interventions: Assess patient/caregiver ability to obtain necessary supplies Assess patient/caregiver ability to perform ulcer/skin care regimen upon admission and as needed Assess ulceration(s) every visit Treatment Activities: Skin care regimen initiated : 11/30/2017 Notes: Electronic Signature(s) Signed: 11/30/2017 4:06:44 PM By: Patrick North, [name] ([id number]) Entered By: Roger Shelter on 11/30/2017 08:52:43 [name], [name] ([id number]) -------------------------------------------------------------------------------- Pain Assessment Details Patient Name: [name] Date of Service: 11/30/2017 8:00 AM Medical Record Number: [id number] Patient Account Number: [id number] Date of Birth/Sex: 09/07/1950 (67 y.o. M) Treating RN: [name] Primary Care Jodine Muchmore: [name] Other Clinician: Referring Zahraa Bhargava: [name] Treating Emmalina Espericueta/Extender: [name], HOYT Weeks in Treatment: 0 Active Problems Location of Pain Severity and Description of Pain Patient Has Paino No Site Locations With Dressing Change: No Pain Management and Medication Current Pain Management: Goals for Pain Management Topical or injectable lidocaine is offered to patient for acute pain when surgical debridement is performed. If needed, Patient is instructed to use over the counter pain medication for the following 24-48 hours after debridement. Wound care MDs do not prescribed pain medications. Patient has chronic pain or uncontrolled pain. Patient has been instructed to make an appointment with their Primary Care Physician for pain management. Electronic Signature(s) Signed: 12/01/2017 5:51:43 PM By: Gretta Cool, BSN, RN, CWS, Kim RN, BSN Entered By: Gretta Cool, BSN, RN, CWS, Kim on 11/30/2017 08:11:24 [name], [name]  ([id number]) -------------------------------------------------------------------------------- Patient/Caregiver Education Details Patient Name: [name] Date of Service: 11/30/2017 8:00 AM Medical Record Number: [id number] Patient Account Number: [id number] Date of Birth/Gender: 08/16/1950 (67 y.o. M) Treating RN: Ahmed Prima Primary Care Physician: [name] Other Clinician: Referring Physician: [name] Treating Physician/Extender: [name], HOYT Weeks in Treatment: 0 Education Assessment Education Provided To: Patient Education Topics Provided Wound/Skin Impairment: Handouts: Caring for Your Ulcer, Other: change dressing as ordered Methods: Demonstration, Explain/Verbal Responses: State content correctly Electronic Signature(s) Signed: 11/30/2017 3:58:02 PM By: Alric Quan Entered By: Alric Quan on 11/30/2017 09:09:53 [name] ([id number]) -------------------------------------------------------------------------------- Wound Assessment Details Patient Name: [name] Date of Service: 11/30/2017 8:00 AM Medical Record Number: [id number] Patient Account Number: [id number] Date of Birth/Sex: Oct 04, 1950 (67 y.o. M) Treating RN: [name] Primary Care Benedicta Sultan: [name] Other Clinician: Referring Charrise Lardner: [name] Treating Leondre Taul/Extender: [name], HOYT Weeks in Treatment: 0 Wound Status Wound Number: 1 Primary Etiology: Pressure Ulcer Wound Location: Right Gluteal fold Wound Status: Open Wounding Event: Pressure Injury Comorbid History: Cataracts, Hypertension, Paraplegia Date Acquired: 11/02/2017 Weeks Of Treatment: 0 Clustered Wound: No Wound Measurements Length: (cm) 4 Width: (cm) 3.2 Depth: (cm) 0.1 Area: (cm) 10.053 Volume: (cm) 1.005 % Reduction in Area: 0% % Reduction in Volume: 0% Epithelialization: Small (1-33%) Tunneling: No Undermining: No Wound  Description Classification: Category/Stage II Foul Od Wound Margin: Flat and Intact Slough/ Exudate Amount: None Present or After Cleansing: No Fibrino No Wound Bed Granulation Amount: Medium (34-66%) Exposed Structure Granulation Quality: Pink Fascia Exposed: No Necrotic Amount: Small (1-33%) Fat Layer (Subcutaneous Tissue) Exposed: Yes Necrotic Quality: Adherent Slough Tendon Exposed:  No Muscle Exposed: No Joint Exposed: No Bone Exposed: No Periwound Skin Texture Texture Color No Abnormalities Noted: No No Abnormalities Noted: No Callus: No Atrophie Blanche: No Crepitus: No Cyanosis: No Excoriation: No Ecchymosis: No Induration: No Erythema: No Rash: No Hemosiderin Staining: No Scarring: No Mottled: No Pallor: No Moisture Rubor: No No Abnormalities Noted: No Dry / Scaly: Yes Maceration: No Wound Preparation Ulcer Cleansing: Rinsed/Irrigated with Saline [name], [name] ([id number]) Topical Anesthetic Applied: None Treatment Notes Wound #1 (Right Gluteal fold) 1. Cleansed with: Clean wound with Normal Saline 2. Anesthetic Topical Lidocaine 4% cream to wound bed prior to debridement 3. Peri-wound Care: Skin Prep 4. Dressing Applied: Prisma Ag 5. Secondary Dressing Applied Bordered Foam Dressing Electronic Signature(s) Signed: 12/04/2017 4:25:58 PM By: Roger Shelter Signed: 12/04/2017 5:09:47 PM By: Gretta Cool, BSN, RN, CWS, Kim RN, BSN Previous Signature: 12/01/2017 5:51:43 PM Version By: Gretta Cool, BSN, RN, CWS, Kim RN, BSN Entered By: Roger Shelter on 12/04/2017 08:57:45 [name], PALAU ([id number]) -------------------------------------------------------------------------------- Vitals Details Patient Name: [name] Date of Service: 11/30/2017 8:00 AM Medical Record Number: [id number] Patient Account Number: [id number] Date of Birth/Sex: 04/09/1951 (67 y.o. M) Treating RN: [name] Primary Care Malachi Kinzler: [name] Other  Clinician: Referring Fedrick Cefalu: [name] Treating Nollie Terlizzi/Extender: [name], HOYT Weeks in Treatment: 0 Vital Signs Time Taken: 08:11 Temperature (F): 98.2 Height (in): 73 Pulse (bpm): 68 Weight (lbs): 210 Respiratory Rate (breaths/min): 16 Body Mass Index (BMI): 27.7 Blood Pressure (mmHg): 138/82 Reference Range: 80 - 120 mg / dl Electronic Signature(s) Signed: 12/01/2017 5:51:43 PM By: Gretta Cool, BSN, RN, CWS, Kim RN, BSN Entered By: Gretta Cool, BSN, RN, CWS, Kim on 11/30/2017 60:73:71

## 2017-12-07 ENCOUNTER — Encounter: Payer: Medicare Other | Admitting: Physician Assistant

## 2017-12-07 DIAGNOSIS — L89312 Pressure ulcer of right buttock, stage 2: Secondary | ICD-10-CM | POA: Diagnosis not present

## 2017-12-08 NOTE — Progress Notes (Signed)
ATWOOD, ADCOCK ([id number]) Visit Report for 12/07/2017 Arrival Information Details Patient Name: [name], [name] Date of Service: 12/07/2017 8:30 AM Medical Record Number: [id number] Patient Account Number: [id number] Date of Birth/Sex: 10/29/1950 (67 y.o. Male) Treating RN: Montey Hora Primary Care Ella Golomb: [name] Other Clinician: Referring Jakyra Kenealy: [name] Treating Debanhi Blaker/Extender: [name], HOYT Weeks in Treatment: 1 Visit Information History Since Last Visit Added or deleted any medications: No Patient Arrived: Wheel Chair Any new allergies or adverse reactions: No Arrival Time: 08:40 Had a fall or experienced change in No Accompanied By: spouse activities of daily living that may affect Transfer Assistance: None risk of falls: Patient Identification Verified: Yes Signs or symptoms of abuse/neglect since last visito No Secondary Verification Process Completed: Yes Hospitalized since last visit: No Patient Requires Transmission-Based No Implantable device outside of the clinic excluding No Precautions: cellular tissue based products placed in the center Patient Has Alerts: Yes since last visit: Patient Alerts: NOT Has Dressing in Place as Prescribed: Yes Diabetic Pain Present Now: No Electronic Signature(s) Signed: 12/07/2017 4:46:49 PM By: Montey Hora Entered By: Montey Hora on 12/07/2017 08:40:39 [name], [name] ([id number]) -------------------------------------------------------------------------------- Clinic Level of Care Assessment Details Patient Name: [name] Date of Service: 12/07/2017 8:30 AM Medical Record Number: [id number] Patient Account Number: [id number] Date of Birth/Sex: 1950/07/23 (67 y.o. Male) Treating RN: Roger Shelter Primary Care Zamariah Seaborn: [name] Other Clinician: Referring Yaretzy Olazabal: [name] Treating Lofton Leon/Extender: [name], HOYT Weeks in Treatment: 1 Clinic  Level of Care Assessment Items TOOL 4 Quantity Score X - Use when only an EandM is performed on FOLLOW-UP visit 1 0 ASSESSMENTS - Nursing Assessment / Reassessment X - Reassessment of Co-morbidities (includes updates in patient status) 1 10 X- 1 5 Reassessment of Adherence to Treatment Plan ASSESSMENTS - Wound and Skin Assessment / Reassessment X - Simple Wound Assessment / Reassessment - one wound 1 5 []  - 0 Complex Wound Assessment / Reassessment - multiple wounds []  - 0 Dermatologic / Skin Assessment (not related to wound area) ASSESSMENTS - Focused Assessment []  - Circumferential Edema Measurements - multi extremities 0 []  - 0 Nutritional Assessment / Counseling / Intervention []  - 0 Lower Extremity Assessment (monofilament, tuning fork, pulses) []  - 0 Peripheral Arterial Disease Assessment (using hand held doppler) ASSESSMENTS - Ostomy and/or Continence Assessment and Care []  - Incontinence Assessment and Management 0 []  - 0 Ostomy Care Assessment and Management (repouching, etc.) PROCESS - Coordination of Care X - Simple Patient / Family Education for ongoing care 1 15 []  - 0 Complex (extensive) Patient / Family Education for ongoing care []  - 0 Staff obtains Programmer, systems, Records, Test Results / Process Orders []  - 0 Staff telephones HHA, Nursing Homes / Clarify orders / etc []  - 0 Routine Transfer to another Facility (non-emergent condition) []  - 0 Routine Hospital Admission (non-emergent condition) []  - 0 New Admissions / Biomedical engineer / Ordering NPWT, Apligraf, etc. []  - 0 Emergency Hospital Admission (emergent condition) X- 1 10 Simple Discharge Coordination JABAR, KRYSIAK. ([id number]) []  - 0 Complex (extensive) Discharge Coordination PROCESS - Special Needs []  - Pediatric / Minor Patient Management 0 []  - 0 Isolation Patient Management []  - 0 Hearing / Language / Visual special needs []  - 0 Assessment of Community assistance  (transportation, D/C planning, etc.) []  - 0 Additional assistance / Altered mentation []  - 0 Support Surface(s) Assessment (bed, cushion, seat, etc.) INTERVENTIONS - Wound Cleansing / Measurement X - Simple Wound Cleansing - one wound 1  5 []  - 0 Complex Wound Cleansing - multiple wounds X- 1 5 Wound Imaging (photographs - any number of wounds) []  - 0 Wound Tracing (instead of photographs) X- 1 5 Simple Wound Measurement - one wound []  - 0 Complex Wound Measurement - multiple wounds INTERVENTIONS - Wound Dressings X - Small Wound Dressing one or multiple wounds 1 10 []  - 0 Medium Wound Dressing one or multiple wounds []  - 0 Large Wound Dressing one or multiple wounds []  - 0 Application of Medications - topical []  - 0 Application of Medications - injection INTERVENTIONS - Miscellaneous []  - External ear exam 0 []  - 0 Specimen Collection (cultures, biopsies, blood, body fluids, etc.) []  - 0 Specimen(s) / Culture(s) sent or taken to Lab for analysis []  - 0 Patient Transfer (multiple staff / Civil Service fast streamer / Similar devices) []  - 0 Simple Staple / Suture removal (25 or less) []  - 0 Complex Staple / Suture removal (26 or more) []  - 0 Hypo / Hyperglycemic Management (close monitor of Blood Glucose) []  - 0 Ankle / Brachial Index (ABI) - do not check if billed separately X- 1 5 Vital Signs [name], [name] ([id number]) Has the patient been seen at the hospital within the last three years: Yes Total Score: 75 Level Of Care: New/Established - Level 2 Electronic Signature(s) Signed: 12/07/2017 5:05:05 PM By: Roger Shelter Entered By: Roger Shelter on 12/07/2017 09:12:01 [name] ([id number]) -------------------------------------------------------------------------------- Encounter Discharge Information Details Patient Name: [name] Date of Service: 12/07/2017 8:30 AM Medical Record Number: [id number] Patient Account Number: [id number] Date of  Birth/Sex: 1950-08-11 (67 y.o. Male) Treating RN: [name] Primary Care Gavan Nordby: [name] Other Clinician: Referring Sheridan [name]: [name] Treating Dajana Gehrig/Extender: [name], HOYT Weeks in Treatment: 1 Encounter Discharge Information Items Discharge Condition: Stable Ambulatory Status: Ambulatory Discharge Destination: Home Transportation: Private Auto Accompanied By: self Schedule Follow-up Appointment: Yes Clinical Summary of Care: Electronic Signature(s) Signed: 12/07/2017 9:56:36 AM By: Gretta Cool, BSN, RN, CWS, Kim RN, BSN Entered By: Gretta Cool, BSN, RN, CWS, Kim on 12/07/2017 09:56:35 [name] ([id number]) -------------------------------------------------------------------------------- Lower Extremity Assessment Details Patient Name: [name] Date of Service: 12/07/2017 8:30 AM Medical Record Number: [id number] Patient Account Number: [id number] Date of Birth/Sex: 01/03/1951 (67 y.o. Male) Treating RN: Montey Hora Primary Care [place]: [name] Other Clinician: Referring Jade Burright: [name] Treating Kimble Delaurentis/Extender: [name], HOYT Weeks in Treatment: 1 Electronic Signature(s) Signed: 12/07/2017 4:46:49 PM By: Montey Hora Entered By: Montey Hora on 12/07/2017 08:42:25 [place], [name] ([id number]) -------------------------------------------------------------------------------- Multi Wound Chart Details Patient Name: [name] Date of Service: 12/07/2017 8:30 AM Medical Record Number: [id number] Patient Account Number: [id number] Date of Birth/Sex: [birth year] (67 y.o. Male) Treating RN: Roger Shelter Primary Care Johnanna Bakke: [name] Other Clinician: Referring Tyliek Timberman: [name] Treating Elster Corbello/Extender: [name], HOYT Weeks in Treatment: 1 Vital Signs Height(in): 73 Pulse(bpm): 68 Weight(lbs): 210 Blood Pressure(mmHg): 151/83 Body Mass Index(BMI): 28 Temperature(F):  98.2 Respiratory Rate 16 (breaths/min): Photos: [1:No Photos] [N/A:N/A] Wound Location: [1:Right Gluteal fold] [N/A:N/A] Wounding Event: [1:Pressure Injury] [N/A:N/A] Primary Etiology: [1:Pressure Ulcer] [N/A:N/A] Comorbid History: [1:Cataracts, Hypertension, Paraplegia] [N/A:N/A] Date Acquired: [1:11/02/2017] [N/A:N/A] Weeks of Treatment: [1:1] [N/A:N/A] Wound Status: [1:Open] [N/A:N/A] Measurements L x W x D [1:3x3.9x0.2] [N/A:N/A] (cm) Area (cm) : [1:9.189] [N/A:N/A] Volume (cm) : [1:1.838] [N/A:N/A] % Reduction in Area: [1:8.60%] [N/A:N/A] % Reduction in Volume: [1:-82.90%] [N/A:N/A] Classification: [1:Category/Stage II] [N/A:N/A] Exudate Amount: [1:Medium] [N/A:N/A] Exudate Type: [1:Serous] [N/A:N/A] Exudate Color: [1:amber] [N/A:N/A] Wound Margin: [1:Flat and Intact] [  N/A:N/A] Granulation Amount: [1:Medium (34-66%)] [N/A:N/A] Granulation Quality: [1:Pink] [N/A:N/A] Necrotic Amount: [1:Small (1-33%)] [N/A:N/A] Exposed Structures: [1:Fat Layer (Subcutaneous Tissue) Exposed: Yes Fascia: No Tendon: No Muscle: No Joint: No Bone: No] [N/A:N/A] Epithelialization: [1:Small (1-33%)] [N/A:N/A] Periwound Skin Texture: [1:Excoriation: No Induration: No Callus: No Crepitus: No Rash: No Scarring: No] [N/A:N/A] Periwound Skin Moisture: Dry/Scaly: Yes N/A N/A Maceration: No Periwound Skin Color: Atrophie Blanche: No N/A N/A Cyanosis: No Ecchymosis: No Erythema: No Hemosiderin Staining: No Mottled: No Pallor: No Rubor: No Temperature: No Abnormality N/A N/A Tenderness on Palpation: No N/A N/A Wound Preparation: Ulcer Cleansing: N/A N/A Rinsed/Irrigated with Saline Topical Anesthetic Applied: Other: lidocaine 4% Treatment Notes Electronic Signature(s) Signed: 12/07/2017 5:05:05 PM By: Roger Shelter Entered By: Roger Shelter on 12/07/2017 09:10:11 [name], [name]  ([id number]) -------------------------------------------------------------------------------- [place] Details Patient Name: [name] Date of Service: 12/07/2017 8:30 AM Medical Record Number: [id number] Patient Account Number: [id number] Date of Birth/Sex: 1951/01/15 (67 y.o. Male) Treating RN: Roger Shelter Primary Care Matrice Herro: [name] Other Clinician: Referring Riannon Mukherjee: [name] Treating Brick Ketcher/Extender: [name], HOYT Weeks in Treatment: 1 Active Inactive ` Orientation to the Wound Care Program Nursing Diagnoses: Knowledge deficit related to the wound healing center program Goals: Patient/caregiver will verbalize understanding of the [facility] Program Date Initiated: 11/30/2017 Target Resolution Date: 12/21/2017 Goal Status: Active Interventions: Provide education on orientation to the wound center Notes: ` Wound/Skin Impairment Nursing Diagnoses: Impaired tissue integrity Goals: Patient/caregiver will verbalize understanding of skin care regimen Date Initiated: 11/30/2017 Target Resolution Date: 12/21/2017 Goal Status: Active Ulcer/skin breakdown will have a volume reduction of 30% by week 4 Date Initiated: 11/30/2017 Target Resolution Date: 12/21/2017 Goal Status: Active Interventions: Assess patient/caregiver ability to obtain necessary supplies Assess patient/caregiver ability to perform ulcer/skin care regimen upon admission and as needed Assess ulceration(s) every visit Treatment Activities: Skin care regimen initiated : 11/30/2017 Notes: Electronic Signature(s) Signed: 12/07/2017 5:05:05 PM By: Patrick North, [name] ([id number]) Entered By: Roger Shelter on 12/07/2017 09:09:59 [name], [name] ([id number]) -------------------------------------------------------------------------------- Pain Assessment Details Patient Name: [name] Date of Service: 12/07/2017 8:30  AM Medical Record Number: [id number] Patient Account Number: [id number] Date of Birth/Sex: 1951-02-12 (67 y.o. Male) Treating RN: Montey Hora Primary Care Rosalyn Archambault: [name] Other Clinician: Referring Raeshawn Vo: [name] Treating Nyajah Hyson/Extender: [name], HOYT Weeks in Treatment: 1 Active Problems Location of Pain Severity and Description of Pain Patient Has Paino No Site Locations Pain Management and Medication Current Pain Management: Electronic Signature(s) Signed: 12/07/2017 4:46:49 PM By: Montey Hora Entered By: Montey Hora on 12/07/2017 08:41:30 [name] ([id number]) -------------------------------------------------------------------------------- Patient/Caregiver Education Details Patient Name: [name] Date of Service: 12/07/2017 8:30 AM Medical Record Number: [id number] Patient Account Number: [id number] Date of Birth/Gender: 29-Oct-1950 (67 y.o. Male) Treating RN: [name] Primary Care Physician: [name] Other Clinician: Referring Physician: [name] Treating Physician/Extender: Sharalyn Ink in Treatment: 1 Education Assessment Education Provided To: Patient Education Topics Provided Wound/Skin Impairment: Handouts: Caring for Your Ulcer, Other: continue wound care as prescribed Methods: Demonstration, Explain/Verbal Responses: State content correctly Electronic Signature(s) Signed: 12/07/2017 6:18:07 PM By: Gretta Cool, BSN, RN, CWS, Kim RN, BSN Entered By: Gretta Cool, BSN, RN, CWS, Kim on 12/07/2017 09:57:00 [name], [name] ([id number]) -------------------------------------------------------------------------------- Wound Assessment Details Patient Name: [name] Date of Service: 12/07/2017 8:30 AM Medical Record Number: [id number] Patient Account Number: [id number] Date of Birth/Sex: 22-Oct-1950 (67 y.o. Male) Treating RN: Montey Hora Primary Care Lianny Molter: [name] Other  Clinician: Referring  Burdett Pinzon: [name] Treating Sherel Fennell/Extender: [name], HOYT Weeks in Treatment: 1 Wound Status Wound Number: 1 Primary Etiology: Pressure Ulcer Wound Location: Right Gluteal fold Wound Status: Open Wounding Event: Pressure Injury Comorbid History: Cataracts, Hypertension, Paraplegia Date Acquired: 11/02/2017 Weeks Of Treatment: 1 Clustered Wound: No Photos Wound Measurements Length: (cm) 3 % Reduction i Width: (cm) 3.9 % Reduction i Depth: (cm) 0.2 Epithelializa Area: (cm) 9.189 Tunneling: Volume: (cm) 1.838 Undermining: n Area: 8.6% n Volume: -82.9% tion: Small (1-33%) No No Wound Description Classification: Category/Stage III Foul Odor Aft Wound Margin: Flat and Intact Slough/Fibrin Exudate Amount: Medium Exudate Type: Serous Exudate Color: amber er Cleansing: No o No Wound Bed Granulation Amount: Large (67-100%) Exposed Structure Granulation Quality: Pink Fascia Exposed: No Necrotic Amount: Small (1-33%) Fat Layer (Subcutaneous Tissue) Exposed: Yes Necrotic Quality: Adherent Slough Tendon Exposed: No Muscle Exposed: No Joint Exposed: No Bone Exposed: No Periwound Skin Texture Texture Color [name], [name] ([id number]) No Abnormalities Noted: No No Abnormalities Noted: No Callus: No Atrophie Blanche: No Crepitus: No Cyanosis: No Excoriation: No Ecchymosis: No Induration: No Erythema: No Rash: No Hemosiderin Staining: No Scarring: No Mottled: No Pallor: No Moisture Rubor: No No Abnormalities Noted: No Dry / Scaly: No Temperature / Pain Maceration: No Temperature: No Abnormality Wound Preparation Ulcer Cleansing: Rinsed/Irrigated with Saline Topical Anesthetic Applied: Other: lidocaine 4%, Treatment Notes Wound #1 (Right Gluteal fold) 1. Cleansed with: Clean wound with Normal Saline 2. Anesthetic Topical Lidocaine 4% cream to wound bed prior to debridement 3. Peri-wound Care: Skin Prep 4.  Dressing Applied: Prisma Ag 5. Secondary Dressing Applied Bordered Foam Dressing Electronic Signature(s) Signed: 12/07/2017 1:28:41 PM By: Worthy Keeler [name] Signed: 12/07/2017 4:46:49 PM By: Montey Hora Entered By: Worthy Keeler on 12/07/2017 13:26:40 [name], [name] ([id number]) -------------------------------------------------------------------------------- Vitals Details Patient Name: [name] Date of Service: 12/07/2017 8:30 AM Medical Record Number: [id number] Patient Account Number: [id number] Date of Birth/Sex: May 08, 1951 (67 y.o. Male) Treating RN: Montey Hora Primary Care Harman Langhans: [name] Other Clinician: Referring Jammy Stlouis: [name] Treating Pasco Marchitto/Extender: [name], HOYT Weeks in Treatment: 1 Vital Signs Time Taken: 08:41 Temperature (F): 98.2 Height (in): 73 Pulse (bpm): 77 Weight (lbs): 210 Respiratory Rate (breaths/min): 16 Body Mass Index (BMI): 27.7 Blood Pressure (mmHg): 151/83 Reference Range: 80 - 120 mg / dl Electronic Signature(s) Signed: 12/07/2017 4:46:49 PM By: Montey Hora Entered By: Montey Hora on 12/07/2017 08:41:48

## 2017-12-08 NOTE — Progress Notes (Signed)
KENSLEY, LARES ([id number]) Visit Report for 12/07/2017 Chief Complaint Document Details Patient Name: [name], [name] Date of Service: 12/07/2017 8:30 AM Medical Record Number: [id number] Patient Account Number: [id number] Date of Birth/Sex: Oct 10, 1950 (67 y.o. Male) Treating RN: Roger Shelter Primary Care Provider: [name] Other Clinician: Referring Provider: [name] Treating Provider/Extender: [name], HOYT Weeks in Treatment: 1 Information Obtained from: Patient Chief Complaint Right gluteal fold ulcer Electronic Signature(s) Signed: 12/07/2017 1:28:41 PM By: Worthy Keeler [name] Entered By: Worthy Keeler on 12/07/2017 08:57:33 NICOLAE, VASEK ([id number]) -------------------------------------------------------------------------------- HPI Details Patient Name: [name] Date of Service: 12/07/2017 8:30 AM Medical Record Number: [id number] Patient Account Number: [id number] Date of Birth/Sex: 1951/06/16 (67 y.o. 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 He states that there is no history of diabetes that he is aware of. He does have issues with his prostate and is currently receiving treatment for this by way of oral medication. With that being said I do not have a lot of details in that regard. Nonetheless the patient presents today as a result of having been referred to [place] by another provider initially home health was set to come out and take care of his wound although due to the  fact that he apparently drives he's not able to receive home health. His wife is therefore trying to help take care of this wound within although they have been struggling with what exactly to do at this point. She states that she can do some things but she is definitely not a nurse and does have some issues with looking at blood. The good news is the wound does not appear to be too deep and is fairly superficial at this point. There is no slough noted there is some nonviable skin noted around the surface of the wound and the perimeter at this point. The central portion of the wound appears to be very good with a dermal layer noted this does not appear to be again deep enough to extend it to subcutaneous tissue at this point. 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 In the pump he has Dilaudid and baclofen. 12/07/17 on evaluation today patient presents for follow-up concerning his ongoing lower back thigh ulcer on the right. He states that he did not get the supplies ordered and therefore has not really been able to perform the dressing changes as directed exactly. His wife was able to get some Boarder Foam Dressing's from the drugstore and subsequently has been using hydrogel which did help to a degree in the wound does appear to be able smaller. There is actually more drainage this week noted than previous. Electronic Signature(s) Signed: 12/07/2017 1:28:41 PM By: Worthy Keeler [name] Entered By: Worthy Keeler on 12/07/2017 09:20:10 DHYAN, NOAH ([id number]) -------------------------------------------------------------------------------- Physical Exam Details Patient Name: [name] Date of Service: 12/07/2017 8:30 AM Medical Record Number: [id number] Patient Account Number: [id number] Date of Birth/Sex: [birth year] (67 y.o. Male) Treating RN: Roger Shelter  Primary  Care Provider: [name] Other Clinician: Referring Provider: [name] Treating Provider/Extender: [name], HOYT Weeks in Treatment: 1 Constitutional Well-nourished and well-hydrated in no acute distress. Respiratory normal breathing without difficulty. clear to auscultation bilaterally. Cardiovascular regular rate and rhythm with normal S1, S2. Psychiatric this patient is able to make decisions and demonstrates good insight into disease process. Alert and Oriented x 3. pleasant and cooperative. Notes Patient's wound did not require sharp debridement today. He has been tolerating the dressing changes without complication unfortunately he did not have the Prisma to apply throughout the week due to some issues with ordering of supplies. It appears there was an issue with documentation on our end this will be corrected and we will be order for him today. Electronic Signature(s) Signed: 12/07/2017 1:28:41 PM By: Worthy Keeler [name] Entered By: Worthy Keeler on 12/07/2017 09:20:47 MARVENS, HOLLARS ([id number]) -------------------------------------------------------------------------------- Physician Orders Details Patient Name: [name] Date of Service: 12/07/2017 8:30 AM Medical Record Number: [id number] Patient Account Number: [id number] Date of Birth/Sex: 1950/11/14 (67 y.o. 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 Anesthetic (add to  Medication List) Wound #1 Right Gluteal fold o Topical Lidocaine 4% cream applied to wound bed prior to debridement (In Clinic Only). Skin Barriers/Peri-Wound Care Wound #1 Right Gluteal fold o Skin Prep Primary Wound Dressing Wound #1 Right Gluteal fold o Hydrogel o Silver Collagen Secondary Dressing Wound #1 Right Gluteal fold o Boardered Foam Dressing Dressing Change Frequency Wound #1 Right Gluteal fold o Change dressing every other day. Follow-up Appointments Wound #1 Right Gluteal fold o Return Appointment in 2 weeks. Off-Loading Wound #1 Right Gluteal fold o Turn and reposition every 2 hours JABER, DUNLOW ([id number]) Electronic Signature(s) Signed: 12/07/2017 1:28:41 PM By: Worthy Keeler [name] Entered By: Worthy Keeler on 12/07/2017 09:21:57 KAYLOR, SIMENSON ([id number]) -------------------------------------------------------------------------------- Problem List Details Patient Name: [name] Date of Service: 12/07/2017 8:30 AM Medical Record Number: [id number] Patient Account Number: [id number] Date of Birth/Sex: 1950/11/03 (67 y.o. 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, stage 3 11/30/2017 Yes S34.109A Unspecified injury to unspecified level of lumbar spinal cord, 11/30/2017 Yes initial encounter I10 Essential (primary) hypertension 11/30/2017 Yes Inactive Problems Resolved Problems Electronic Signature(s) Signed: 12/07/2017 1:28:41 PM By: Worthy Keeler [name] Entered By: Worthy Keeler on 12/07/2017 13:27:14 [name]  ([id number]) -------------------------------------------------------------------------------- Progress Note Details Patient Name: [name] Date of Service: 12/07/2017 8:30 AM Medical Record Number: [id number] Patient Account Number: [id number] Date of Birth/Sex: 1950/08/31 (67 y.o. Male) Treating RN: Roger Shelter Primary Care Provider: [name] Other Clinician: Referring Provider: [name] Treating Provider/Extender: [name], HOYT Weeks in Treatment: 1 Subjective Chief Complaint Information obtained from Patient Right gluteal fold ulcer History of Present Illness (HPI) 11/30/17 patient presents today with a history of hypertension, paraplegia secondary to spinal cord  injury which occurred as a result of a spinal surgery which did not go well, and they wound which has been present for about a month in the right gluteal fold. He states that there is no history of diabetes that he is aware of. He does have issues with his prostate and is currently receiving treatment for this by way of oral medication. With that being said I do not have a lot of details in that regard. Nonetheless the patient presents today as a result of having been referred to [place] by another provider initially home health was set to come out and take care of his wound although due to the fact that he apparently drives he's not able to receive home health. His wife is therefore trying to help take care of this wound within although they have been struggling with what exactly to do at this point. She states that she can do some things but she is definitely not a nurse and does have some issues with looking at blood. The good news is the wound does not appear to be too deep and is fairly superficial at this point. There is no slough noted there is some nonviable skin noted around the surface of the wound and the perimeter at this point. The central portion of the wound appears to be very good with  a dermal layer noted this does not appear to be again deep enough to extend it to subcutaneous tissue at this point. 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 In the pump he has Dilaudid and baclofen. 12/07/17 on evaluation today patient presents for follow-up concerning his ongoing lower back thigh ulcer on the right. He states that he did not get the supplies ordered and therefore has not really been able to perform the dressing changes as directed exactly. His wife was able to get some Boarder Foam Dressing's from the drugstore and subsequently has been using hydrogel which did help to a degree in the wound does appear to be able smaller. There is actually more drainage this week noted than previous. Patient History Information obtained from Patient. Family History Hypertension - Father, Stroke - Mother, No family history of Cancer, Diabetes, Heart Disease, Kidney Disease, Lung Disease, Seizures, Thyroid Problems, Tuberculosis. Social History Never smoker, Marital Status - Married, Alcohol Use - Never, Drug Use - No History, Caffeine Use - Daily. Medical And Surgical History Notes Oncologic Prostate cancer- currently treated with horomone therapy Review of Systems (ROS) Constitutional Symptoms (General Health) Denies complaints or symptoms of Fever, Chills. Respiratory The patient has no complaints or symptoms. KOLBIE, LEPKOWSKI ([id number]) Cardiovascular The patient has no complaints or symptoms. Psychiatric The patient has no complaints or symptoms. Objective Constitutional Well-nourished and well-hydrated in no acute distress. Vitals Time Taken: 8:41 AM, Height: 73 in, Weight: 210 lbs, BMI: 27.7, Temperature: 98.2 F, Pulse: 77 bpm, Respiratory Rate: 16 breaths/min, Blood Pressure: 151/83 mmHg. Respiratory normal breathing without difficulty. clear to auscultation  bilaterally. Cardiovascular regular rate and rhythm with normal S1, S2. Psychiatric this patient is able to make decisions and demonstrates good insight into disease process. Alert and Oriented x 3. pleasant and cooperative. General Notes: Patient's wound did not require sharp debridement today. He has been tolerating the dressing changes without complication unfortunately he did not have the Prisma to apply throughout the week due to some issues with ordering of supplies. It  appears there was an issue with documentation on our end this will be corrected and we will be order for him today. Integumentary (Hair, Skin) Wound #1 status is Open. Original cause of wound was Pressure Injury. The wound is located on the Right Gluteal fold. The wound measures 3cm length x 3.9cm width x 0.2cm depth; 9.189cm^2 area and 1.838cm^3 volume. There is Fat Layer (Subcutaneous Tissue) Exposed exposed. There is no tunneling or undermining noted. There is a medium amount of serous drainage noted. The wound margin is flat and intact. There is large (67-100%) pink granulation within the wound bed. There is a small (1-33%) amount of necrotic tissue within the wound bed including Adherent Slough. The periwound skin appearance did not exhibit: Callus, Crepitus, Excoriation, Induration, Rash, Scarring, Dry/Scaly, Maceration, Atrophie Blanche, Cyanosis, Ecchymosis, Hemosiderin Staining, Mottled, Pallor, Rubor, Erythema. Periwound temperature was noted as No Abnormality. Assessment Active Problems ICD-10 G82.20 - Paraplegia, unspecified L89.313 - Pressure ulcer of right buttock, stage 3 S34.109A - Unspecified injury to unspecified level of lumbar spinal cord, initial encounter I10 - Essential (primary) hypertension Ferrone, Herold J. ([id number]) Plan Wound Cleansing: Wound #1 Right Gluteal fold: Clean wound with Normal Saline. Anesthetic (add to Medication List): Wound #1 Right Gluteal fold: Topical Lidocaine  4% cream applied to wound bed prior to debridement (In Clinic Only). Skin Barriers/Peri-Wound Care: Wound #1 Right Gluteal fold: Skin Prep Primary Wound Dressing: Wound #1 Right Gluteal fold: Hydrogel Silver Collagen Secondary Dressing: Wound #1 Right Gluteal fold: Boardered Foam Dressing Dressing Change Frequency: Wound #1 Right Gluteal fold: Change dressing every other day. Follow-up Appointments: Wound #1 Right Gluteal fold: Return Appointment in 2 weeks. Off-Loading: Wound #1 Right Gluteal fold: Turn and reposition every 2 hours Currently I'm going to suggest that we continue with the Prisma along with a Boarder Foam Dressing I think this is appropriate we will work on we ordering supplies for him at this point. Patient is in agreement with plan. We will see were things stand in two weeks time when we see him for reevaluation. Please see above for specific wound care orders. We will see patient for re-evaluation in 1 week(s) here in the clinic. If anything worsens or changes patient will contact our office for additional recommendations. Electronic Signature(s) Signed: 12/07/2017 1:28:41 PM By: Worthy Keeler [name] Entered By: Worthy Keeler on 12/07/2017 13:28:17 CELVIN, TANEY ([id number]) -------------------------------------------------------------------------------- ROS/PFSH Details Patient Name: [name] Date of Service: 12/07/2017 8:30 AM Medical Record Number: [id number] Patient Account Number: [id number] Date of Birth/Sex: [birth year] (67 y.o. Male) Treating RN: Roger Shelter Primary Care Provider: [name] Other Clinician: Referring Provider: [name] Treating Provider/Extender: [name], HOYT Weeks in Treatment: 1 Information Obtained From Patient Wound History Do you currently have one or more open woundso Yes How many open wounds do you currently haveo 1 Approximately how long have you had your woundso 1 month How have  you been treating your wound(s) until nowo cream Has your wound(s) ever healed and then re-openedo No Have you had any lab work done in the past montho No Have you tested positive for an antibiotic resistant organism (MRSA, VRE)o No Have you tested positive for osteomyelitis (bone infection)o No Have you had any tests for circulation on your legso No Constitutional Symptoms (General Health) Complaints and Symptoms: Negative for: Fever; Chills Eyes Medical History: Positive for: Cataracts - both removed Negative for: Glaucoma; Optic Neuritis Ear/Nose/Mouth/Throat Medical History: Negative for: Chronic sinus problems/congestion; Middle ear problems Hematologic/Lymphatic Medical History:  Negative for: Anemia; Hemophilia; Human Immunodeficiency Virus; Lymphedema Respiratory Complaints and Symptoms: No Complaints or Symptoms Medical History: Negative for: Aspiration; Asthma; Chronic Obstructive Pulmonary Disease (COPD); Pneumothorax; Sleep Apnea; Tuberculosis Cardiovascular Complaints and Symptoms: No Complaints or Symptoms Medical History: Positive for: Hypertension - takes medication NIRVAAN, FRETT. ([id number]) Negative for: Angina; Arrhythmia; Congestive Heart Failure; Coronary Artery Disease; Deep Vein Thrombosis; Hypotension; Myocardial Infarction; Peripheral Arterial Disease; Peripheral Venous Disease; Phlebitis; Vasculitis Gastrointestinal Medical History: Negative for: Cirrhosis ; Colitis; Crohnos; Hepatitis A; Hepatitis B; Hepatitis C Endocrine Medical History: Negative for: Type I Diabetes; Type II Diabetes Genitourinary Medical History: Negative for: End Stage Renal Disease Immunological Medical History: Negative for: Lupus Erythematosus; Raynaudos; Scleroderma Integumentary (Skin) Medical History: Negative for: History of Burn; History of pressure wounds Musculoskeletal Medical History: Negative for: Gout; Rheumatoid Arthritis; Osteoarthritis;  Osteomyelitis Neurologic Medical History: Positive for: Paraplegia - waist down Negative for: Dementia; Neuropathy; Quadriplegia; Seizure Disorder Oncologic Medical History: Negative for: Received Chemotherapy; Received Radiation Past Medical History Notes: Prostate cancer- currently treated with horomone therapy Psychiatric Complaints and Symptoms: No Complaints or Symptoms Medical History: Negative for: Anorexia/bulimia; Confinement Anxiety HBO Extended History Items Eyes: Cataracts Immunizations IMARI, SIVERTSEN ([id number]) Pneumococcal Vaccine: Received Pneumococcal Vaccination: No Implantable Devices Family and Social History Cancer: No; Diabetes: No; Heart Disease: No; Hypertension: Yes - Father; Kidney Disease: No; Lung Disease: No; Seizures: No; Stroke: Yes - Mother; Thyroid Problems: No; Tuberculosis: No; Never smoker; Marital Status - Married; Alcohol Use: Never; Drug Use: No History; Caffeine Use: Daily; Advanced Directives: Yes (Copy provided); Patient does not want information on Advanced Directives; Living Will: Yes (Copy provided) Physician Affirmation I have reviewed and agree with the above information. Electronic Signature(s) Signed: 12/07/2017 1:28:41 PM By: Worthy Keeler [name] Signed: 12/07/2017 5:05:05 PM By: Roger Shelter Entered By: Worthy Keeler on 12/07/2017 09:20:29 NYCHOLAS, RAYNER ([id number]) -------------------------------------------------------------------------------- SuperBill Details Patient Name: [name] Date of Service: 12/07/2017 Medical Record Number: [id number] Patient Account Number: [id number] Date of Birth/Sex: [birth year] (67 y.o. 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, stage 2 S34.109A Unspecified injury to unspecified level of lumbar spinal cord, initial encounter Cooper (primary) hypertension Facility Procedures CPT4 Code: [id number] Description: 00174 - WOUND CARE VISIT-LEV 2 EST PT Modifier: Quantity: 1 Physician Procedures CPT4 Code Description: [id number] 91638 - WC PHYS LEVEL 3 - EST PT ICD-10 Diagnosis Description G82.20 Paraplegia, unspecified 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

## 2017-12-21 ENCOUNTER — Encounter: Payer: Medicare Other | Attending: Physician Assistant | Admitting: Physician Assistant

## 2017-12-21 DIAGNOSIS — I10 Essential (primary) hypertension: Secondary | ICD-10-CM | POA: Insufficient documentation

## 2017-12-21 DIAGNOSIS — L89313 Pressure ulcer of right buttock, stage 3: Secondary | ICD-10-CM | POA: Diagnosis not present

## 2017-12-21 DIAGNOSIS — C61 Malignant neoplasm of prostate: Secondary | ICD-10-CM | POA: Insufficient documentation

## 2017-12-21 DIAGNOSIS — G8220 Paraplegia, unspecified: Secondary | ICD-10-CM | POA: Diagnosis not present

## 2017-12-22 NOTE — Progress Notes (Signed)
[name], [name] ([id number]) Visit Report for 12/21/2017 Arrival Information Details Patient Name: [name], [name] Date of Service: 12/21/2017 8:00 AM Medical Record Number: [id number] Patient Account Number: [id number] Date of Birth/Sex: [birth year] (67 y.o. M) Treating RN: Ahmed Prima Primary Care Purl Claytor: [name] Other Clinician: Referring Sheray Grist: [name] Treating Nyrah Demos/Extender: [name], HOYT Weeks in Treatment: 3 Visit Information History Since Last Visit All ordered tests and consults were completed: No Patient Arrived: Wheel Chair Added or deleted any medications: No Arrival Time: 08:13 Any new allergies or adverse reactions: No Accompanied By: wife Signs or symptoms of abuse/neglect since last visito No Transfer Assistance: EasyPivot Patient Lift Hospitalized since last visit: No Patient Identification Verified: Yes Implantable device outside of the clinic excluding No cellular tissue based products placed in the center Secondary Verification Process Yes since last visit: Completed: Has Dressing in Place as Prescribed: Yes Patient Requires Transmission-Based No Precautions: Pain Present Now: Yes Patient Has Alerts: Yes Patient Alerts: NOT Diabetic Electronic Signature(s) Signed: 12/21/2017 5:18:34 PM By: Alric Quan Entered By: Alric Quan on 12/21/2017 08:18:39 [name] ([id number]) -------------------------------------------------------------------------------- Encounter Discharge Information Details Patient Name: [name] Date of Service: 12/21/2017 8:00 AM Medical Record Number: [id number] Patient Account Number: [id number] Date of Birth/Sex: October 20, 1950 (67 y.o. M) Treating RN: Ahmed Prima Primary Care Charnelle Bergeman: [name] Other Clinician: Referring Jezreel Sisk: [name] Treating Vasilisa Vore/Extender: [name], HOYT Weeks in Treatment: 3 Encounter Discharge Information  Items Discharge Condition: Stable Ambulatory Status: Wheelchair Discharge Destination: Home Transportation: Private Auto Accompanied By: wife Schedule Follow-up Appointment: Yes Clinical Summary of Care: Electronic Signature(s) Signed: 12/21/2017 5:18:34 PM By: Alric Quan Entered By: Alric Quan on 12/21/2017 08:53:29 [name] ([id number]) -------------------------------------------------------------------------------- Lower Extremity Assessment Details Patient Name: [name] Date of Service: 12/21/2017 8:00 AM Medical Record Number: [id number] Patient Account Number: [id number] Date of Birth/Sex: [birth year] (67 y.o. M) Treating RN: Ahmed Prima Primary Care Raffi Milstein: [name] Other Clinician: Referring Riel Hirschman: [name] Treating Devinne Epstein/Extender: [name], HOYT Weeks in Treatment: 3 Electronic Signature(s) Signed: 12/21/2017 5:18:34 PM By: Alric Quan Entered By: Alric Quan on 12/21/2017 08:25:52 [name], [name] ([id number]) -------------------------------------------------------------------------------- Multi Wound Chart Details Patient Name: [name] Date of Service: 12/21/2017 8:00 AM Medical Record Number: [id number] Patient Account Number: [id number] Date of Birth/Sex: 1950/10/21 (67 y.o. M) Treating RN: Roger Shelter Primary Care Jora Galluzzo: [name] Other Clinician: Referring Shacoria Latif: [name] Treating Jenine Krisher/Extender: [name], HOYT Weeks in Treatment: 3 Vital Signs Height(in): 73 Pulse(bpm): 55 Weight(lbs): 210 Blood Pressure(mmHg): 138/66 Body Mass Index(BMI): 28 Temperature(F): 97.8 Respiratory Rate 16 (breaths/min): Photos: [1:No Photos] [N/A:N/A] Wound Location: [1:Right Gluteal fold] [N/A:N/A] Wounding Event: [1:Pressure Injury] [N/A:N/A] Primary Etiology: [1:Pressure Ulcer] [N/A:N/A] Comorbid History: [1:Cataracts, Hypertension, Paraplegia]  [N/A:N/A] Date Acquired: [1:11/02/2017] [N/A:N/A] Weeks of Treatment: [1:3] [N/A:N/A] Wound Status: [1:Open] [N/A:N/A] Measurements L x W x D [1:4.2x3.6x0.1] [N/A:N/A] (cm) Area (cm) : [1:11.875] [N/A:N/A] Volume (cm) : [1:1.188] [N/A:N/A] % Reduction in Area: [1:-18.10%] [N/A:N/A] % Reduction in Volume: [1:-18.20%] [N/A:N/A] Classification: [1:Category/Stage III] [N/A:N/A] Exudate Amount: [1:Large] [N/A:N/A] Exudate Type: [1:Serous] [N/A:N/A] Exudate Color: [1:amber] [N/A:N/A] Wound Margin: [1:Flat and Intact] [N/A:N/A] Granulation Amount: [1:Large (67-100%)] [N/A:N/A] Granulation Quality: [1:Pink] [N/A:N/A] Necrotic Amount: [1:Small (1-33%)] [N/A:N/A] Exposed Structures: [1:Fat Layer (Subcutaneous Tissue) Exposed: Yes Fascia: No Tendon: No Muscle: No Joint: No Bone: No] [N/A:N/A] Epithelialization: [1:Small (1-33%)] [N/A:N/A] Periwound Skin Texture: [1:Excoriation: No Induration: No Callus: No Crepitus: No Rash: No Scarring: No] [N/A:N/A] Periwound Skin Moisture: Maceration: Yes N/A N/A Dry/Scaly: No Periwound Skin  Color: Atrophie Blanche: No N/A N/A Cyanosis: No Ecchymosis: No Erythema: No Hemosiderin Staining: No Mottled: No Pallor: No Rubor: No Temperature: No Abnormality N/A N/A Tenderness on Palpation: No N/A N/A Wound Preparation: Ulcer Cleansing: N/A N/A Rinsed/Irrigated with Saline Topical Anesthetic Applied: Other: lidocaine 4% Treatment Notes Electronic Signature(s) Signed: 12/21/2017 5:11:24 PM By: Roger Shelter Entered By: Roger Shelter on 12/21/2017 08:33:36 [name], [name] ([id number]) -------------------------------------------------------------------------------- Brent Details Patient Name: [name] Date of Service: 12/21/2017 8:00 AM Medical Record Number: [id number] Patient Account Number: [id number] Date of Birth/Sex: 07/09/1951 (67 y.o. M) Treating RN: Roger Shelter Primary Care Patina Spanier: [name] Other Clinician: Referring Thorne Wirz: [name] Treating Indiah Heyden/Extender: [name], HOYT Weeks in Treatment: 3 Active Inactive ` Orientation to the Wound Care Program Nursing Diagnoses: Knowledge deficit related to the wound healing center program Goals: Patient/caregiver will verbalize understanding of the [facility] Date Initiated: 11/30/2017 Target Resolution Date: 12/21/2017 Goal Status: Active Interventions: Provide education on orientation to the wound center Notes: ` Wound/Skin Impairment Nursing Diagnoses: Impaired tissue integrity Goals: Patient/caregiver will verbalize understanding of skin care regimen Date Initiated: 11/30/2017 Target Resolution Date: 12/21/2017 Goal Status: Active Ulcer/skin breakdown will have a volume reduction of 30% by week 4 Date Initiated: 11/30/2017 Target Resolution Date: 12/21/2017 Goal Status: Active Interventions: Assess patient/caregiver ability to obtain necessary supplies Assess patient/caregiver ability to perform ulcer/skin care regimen upon admission and as needed Assess ulceration(s) every visit Treatment Activities: Skin care regimen initiated : 11/30/2017 Notes: Electronic Signature(s) Signed: 12/21/2017 5:11:24 PM By: Patrick North, [name] ([id number]) Entered By: Roger Shelter on 12/21/2017 08:33:27 [name], [name] ([id number]) -------------------------------------------------------------------------------- Pain Assessment Details Patient Name: [name] Date of Service: 12/21/2017 8:00 AM Medical Record Number: [id number] Patient Account Number: [id number] Date of Birth/Sex: 10/06/1950 (67 y.o. M) Treating RN: Ahmed Prima Primary Care Roark Rufo: [name] Other Clinician: Referring Statia Burdick: [name] Treating Donnisha Besecker/Extender: [name], HOYT Weeks in Treatment: 3 Active Problems Location of Pain Severity and Description of  Pain Patient Has Paino Yes Site Locations Pain Location: Generalized Pain Character of Pain Describe the Pain: Aching Pain Management and Medication Current Pain Management: Goals for Pain Management pt is having general pain today Notes Topical or injectable lidocaine is offered to patient for acute pain when surgical debridement is performed. If needed, Patient is instructed to use over the counter pain medication for the following 24-48 hours after debridement. Wound care MDs do not prescribed pain medications. Patient has chronic pain or uncontrolled pain. Patient has been instructed to make an appointment with their Primary Care Physician for pain management. Electronic Signature(s) Signed: 12/21/2017 5:18:34 PM By: Alric Quan Entered By: Alric Quan on 12/21/2017 08:19:15 [name] ([id number]) -------------------------------------------------------------------------------- Patient/Caregiver Education Details Patient Name: [name] Date of Service: 12/21/2017 8:00 AM Medical Record Number: [id number] Patient Account Number: [id number] Date of Birth/Gender: 1951-04-12 (67 y.o. M) Treating RN: Ahmed Prima Primary Care Physician: [name] Other Clinician: Referring Physician: [name] Treating Physician/Extender: Sharalyn Ink in Treatment: 3 Education Assessment Education Provided To: Patient Education Topics Provided Wound/Skin Impairment: Handouts: Caring for Your Ulcer, Other: change dressing as ordered Methods: Demonstration, Explain/Verbal Responses: State content correctly Electronic Signature(s) Signed: 12/21/2017 5:18:34 PM By: Alric Quan Entered By: Alric Quan on 12/21/2017 08:53:45 Falletta, [name] ([id number]) -------------------------------------------------------------------------------- Wound Assessment Details Patient Name: [name] Date of Service: 12/21/2017 8:00  AM Medical Record Number: [id number] Patient Account Number: [id number] Date of Birth/Sex: [birth year] (67  y.o. M) Treating RN: Ahmed Prima Primary Care Chanan Detwiler: [name] Other Clinician: Referring Setsuko Robins: [name] Treating Nicola Quesnell/Extender: [name], HOYT Weeks in Treatment: 3 Wound Status Wound Number: 1 Primary Etiology: Pressure Ulcer Wound Location: Right Gluteal fold Wound Status: Open Wounding Event: Pressure Injury Comorbid History: Cataracts, Hypertension, Paraplegia Date Acquired: 11/02/2017 Weeks Of Treatment: 3 Clustered Wound: No Wound Measurements Length: (cm) 4.2 Width: (cm) 3.6 Depth: (cm) 0.1 Area: (cm) 11.875 Volume: (cm) 1.188 % Reduction in Area: -18.1% % Reduction in Volume: -18.2% Epithelialization: Small (1-33%) Tunneling: No Undermining: No Wound Description Classification: Category/Stage III Foul Od Wound Margin: Flat and Intact Slough/ Exudate Amount: Large Exudate Type: Serous Exudate Color: amber or After Cleansing: No Fibrino Yes Wound Bed Granulation Amount: Large (67-100%) Exposed Structure Granulation Quality: Pink Fascia Exposed: No Necrotic Amount: Small (1-33%) Fat Layer (Subcutaneous Tissue) Exposed: Yes Necrotic Quality: Adherent Slough Tendon Exposed: No Muscle Exposed: No Joint Exposed: No Bone Exposed: No Periwound Skin Texture Texture Color No Abnormalities Noted: No No Abnormalities Noted: No Callus: No Atrophie Blanche: No Crepitus: No Cyanosis: No Excoriation: No Ecchymosis: No Induration: No Erythema: No Rash: No Hemosiderin Staining: No Scarring: No Mottled: No Pallor: No Moisture Rubor: No No Abnormalities Noted: No Dry / Scaly: No Temperature / Pain Maceration: Yes Temperature: No Abnormality [name], [name] ([id number]) Wound Preparation Ulcer Cleansing: Rinsed/Irrigated with Saline Topical Anesthetic Applied: Other: lidocaine 4%, Treatment Notes Wound #1 (Right  Gluteal fold) 1. Cleansed with: Clean wound with Normal Saline 2. Anesthetic Topical Lidocaine 4% cream to wound bed prior to debridement 3. Peri-wound Care: Skin Prep 4. Dressing Applied: Prisma Ag 5. Secondary Dressing Applied Bordered Foam Dressing Electronic Signature(s) Signed: 12/21/2017 5:18:34 PM By: Alric Quan Entered By: Alric Quan on 12/21/2017 08:25:41 [name], [name] ([id number]) -------------------------------------------------------------------------------- Vitals Details Patient Name: [name] Date of Service: 12/21/2017 8:00 AM Medical Record Number: [id number] Patient Account Number: [id number] Date of Birth/Sex: 1951-04-25 (67 y.o. M) Treating RN: Ahmed Prima Primary Care Tychelle Purkey: [name] Other Clinician: Referring Galina Haddox: [name] Treating Gwyn Hieronymus/Extender: [name], HOYT Weeks in Treatment: 3 Vital Signs Time Taken: 08:19 Temperature (F): 97.8 Height (in): 73 Pulse (bpm): 55 Weight (lbs): 210 Respiratory Rate (breaths/min): 16 Body Mass Index (BMI): 27.7 Blood Pressure (mmHg): 138/66 Reference Range: 80 - 120 mg / dl Electronic Signature(s) Signed: 12/21/2017 5:18:34 PM By: Alric Quan Entered By: Alric Quan on 12/21/2017 08:21:12

## 2017-12-22 NOTE — Progress Notes (Signed)
LAMOUNT, BANKSON ([id number]) Visit Report for 12/21/2017 Chief Complaint Document Details Patient Name: Brett, [name] Date of Service: 12/21/2017 8:00 AM Medical Record Number: [id number] Patient Account Number: [id number] Date of Birth/Sex: 1950/12/09 (67 y.o. M) Treating RN: Roger Shelter Primary Care Provider: [name] Other Clinician: Referring Provider: [name] Treating Provider/Extender: [name], Lakesa Coste Weeks in Treatment: 3 Information Obtained from: Patient Chief Complaint Right gluteal fold ulcer Electronic Signature(s) Signed: 12/21/2017 9:07:56 PM By: Worthy Keeler [name] Entered By: Worthy Keeler on 12/21/2017 08:30:45 [name], [name] ([id number]) -------------------------------------------------------------------------------- Debridement Details Patient Name: [name] Date of Service: 12/21/2017 8:00 AM Medical Record Number: [id number] Patient Account Number: [id number] Date of Birth/Sex: Jul 30, 1950 (67 y.o. M) Treating RN: Roger Shelter Primary Care Provider: [name] Other Clinician: Referring Provider: [name] Treating Provider/Extender: [name], Mysha Peeler Weeks in Treatment: 3 Debridement Performed for Wound #1 Right Gluteal fold Assessment: Performed By: Physician [name], Jinger Middlesworth E., [name] Debridement Type: Debridement Pre-procedure Verification/Time Yes - 08:35 Out Taken: Start Time: 08:35 Pain Control: Other : lidocaine 4% Total Area Debrided (L x W): 4.2 (cm) x 3.6 (cm) = 15.12 (cm) Tissue and other material Viable, Non-Viable, Slough, Subcutaneous, Skin: Dermis , Skin: Epidermis, Biofilm, Slough debrided: Level: Skin/Subcutaneous Tissue Debridement Description: Excisional Instrument: Curette Bleeding: Minimum Hemostasis Achieved: Pressure End Time: 08:36 Procedural Pain: 0 Post Procedural Pain: 0 Response to Treatment: Procedure was tolerated well Level of Consciousness: Awake and  Alert Post Procedure Vitals: Temperature: 97.8 Pulse: 55 Respiratory Rate: 16 Blood Pressure: Systolic Blood Pressure: 426 Diastolic Blood Pressure: 66 Post Debridement Measurements of Total Wound Length: (cm) 4.2 Stage: Category/Stage III Width: (cm) 3.6 Depth: (cm) 0.2 Volume: (cm) 2.375 Character of Wound/Ulcer Post Stable Debridement: Post Procedure Diagnosis Same as Pre-procedure Electronic Signature(s) Signed: 12/21/2017 5:11:24 PM By: Roger Shelter Signed: 12/21/2017 9:07:56 PM By: Worthy Keeler [name] Entered By: Roger Shelter on 12/21/2017 08:36:45 [name], [name] ([id number]) [name], [name] ([id number]) -------------------------------------------------------------------------------- HPI Details Patient Name: [name] Date of Service: 12/21/2017 8:00 AM Medical Record Number: [id number] Patient Account Number: [id number] Date of Birth/Sex: July 30, 1950 (67 y.o. 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 He states that there is no history of diabetes that he is aware of. He does have issues with his prostate and is currently receiving treatment for this by way of oral medication. With that being said I do not have a lot of details in that regard. Nonetheless the patient presents today as a result of having been referred to [place] by another provider initially home health was set to come out and take care of his wound although due to the fact that he apparently drives he's not able to receive home health.  His wife is therefore trying to help take care of this wound within although they have been struggling with what exactly to do at this point. She states that she can do some things but she is definitely not a nurse and does have some issues with looking at blood. The good news is the wound does not appear to be too deep  and is fairly superficial at this point. There is no slough noted there is some nonviable skin noted around the surface of the wound and the perimeter at this point. The central portion of the wound appears to be very good with a dermal layer noted this does not appear to be again deep enough to extend it to subcutaneous tissue at this point. 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 In the pump he has Dilaudid and baclofen. 12/07/17 on evaluation today patient presents for follow-up concerning his ongoing lower back thigh ulcer on the right. He states that he did not get the supplies ordered and therefore has not really been able to perform the dressing changes as directed exactly. His wife was able to get some Boarder Foam Dressing's from the drugstore and subsequently has been using hydrogel which did help to a degree in the wound does appear to be able smaller. There is actually more drainage this week noted than previous. 12/21/17 on evaluation today patient appears to be doing rather well in regard to his right gluteal ulcer. He has been tolerating the dressing changes without complication. There does not appear to be any evidence of infection at this point in time. Overall the wound does seem to be making some progress as far as the edges are concerned there's not as much in the way of overlapping of the external wound edges and he has a good epithelium to wound bed border for the most part. This however is not true right at the 12 o'clock location over the span of a little  over a centimeters which actually will require debridement today to clean this away and hopefully allow it to continue to heal more appropriately. Electronic Signature(s) Signed: 12/21/2017 9:07:56 PM By: Worthy Keeler [name] Entered By: Worthy Keeler on 12/21/2017 08:59:31 [name], [name] ([id number]) -------------------------------------------------------------------------------- Physical Exam Details Patient Name: [name] Date of Service: 12/21/2017 8:00 AM Medical Record Number: [id number] Patient Account Number: [id number] Date of Birth/Sex: May 01, 1951 (67 y.o. M) Treating RN: Roger Shelter Primary Care Provider: [name] Other Clinician: Referring Provider: [name] Treating Provider/Extender: [name], Dhruti Ghuman Weeks in Treatment: 3 Constitutional Well-nourished and well-hydrated in no acute distress. Respiratory normal breathing without difficulty. Psychiatric this patient is able to make decisions and demonstrates good insight into disease process. Alert and Oriented x 3. pleasant and cooperative. Notes Patient's wound today did require sharp debridement again at the 12 o'clock location to remove away some of the skin and subcutaneous tissue where this was more overlapping and ragged and not really attaching to the wound bed allowing for good epithelialization. Patient tolerated this without any pain. Unfortunately the bleeding was not able to completely resolve just with pressure alone I did have to utilize some silver nitrate at this area although I think this will be okay in the end I did explain to patient and his wife that this is something that will improve although the discoloration may stay for a few days to eventually see this come off. Electronic Signature(s) Signed: 12/21/2017 9:07:56 PM By: Worthy Keeler [name] Entered By: Worthy Keeler on 12/21/2017 09:00:25 [name], [name]  ([id number]) -------------------------------------------------------------------------------- Physician Orders Details Patient Name: [name] Date of Service: 12/21/2017 8:00 AM Medical Record Number: [id number] Patient Account Number: [id number] Date of Birth/Sex: Jan 31, 1951 (67 y.o. 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 Anesthetic (add to Medication List) Wound #1 Right Gluteal fold o Topical Lidocaine 4% cream applied to wound bed prior to debridement (In Clinic Only). Skin Barriers/Peri-Wound Care Wound #1 Right Gluteal fold o Skin Prep Primary Wound Dressing Wound #1 Right Gluteal fold o Silver Collagen Secondary Dressing Wound #1 Right Gluteal fold o Boardered Foam Dressing Dressing Change Frequency Wound #1 Right Gluteal fold o Change dressing every other day. Follow-up Appointments Wound #1 Right Gluteal fold o Return Appointment in 2 weeks. Off-Loading Wound #1 Right Gluteal fold o Turn and reposition every 2 hours [name], [name] ([id number]) Electronic Signature(s) Signed: 12/21/2017 5:11:24 PM By: Roger Shelter Signed: 12/21/2017 9:07:56 PM By: Worthy Keeler [name] Entered By: Roger Shelter on 12/21/2017 08:39:32 [name], [name] ([id number]) -------------------------------------------------------------------------------- Problem List Details Patient Name: [name] Date of Service: 12/21/2017 8:00  AM Medical Record Number: [id number] Patient Account Number: [id number] Date of Birth/Sex: 12-04-1950 (67 y.o. 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, stage 3 11/30/2017 No Yes S34.109A Unspecified injury to unspecified level of lumbar spinal cord, 11/30/2017 No Yes initial encounter [place] (primary) hypertension 11/30/2017 No Yes Inactive Problems Resolved Problems Electronic Signature(s) Signed: 12/21/2017 9:07:56 PM By: Worthy Keeler [name] Entered By: Worthy Keeler on 12/21/2017 08:30:29 [name], [name] ([id number]) -------------------------------------------------------------------------------- Progress Note Details Patient Name: [name] Date of Service: 12/21/2017 8:00 AM Medical Record Number: [id number] Patient Account Number: [id number] Date of Birth/Sex: January 20, 1951 (67 y.o. 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 He states that there is no history of diabetes that he is aware of. He does have issues with his prostate and is currently receiving treatment for this by way of oral medication. With  that being said I do not have a lot of details in that regard. Nonetheless the patient presents today as a result of having been referred to [place] by another provider initially home health was set to come out and take care of his wound although due to the fact that he apparently drives he's not able to receive home health. His wife is therefore trying to help take care of this wound within although they have been struggling with what exactly to do at this point. She states that she can do some things but she is definitely not a nurse and does have some issues with looking at blood. The good news is the wound does  not appear to be too deep and is fairly superficial at this point. There is no slough noted there is some nonviable skin noted around the surface of the wound and the perimeter at this point. The central portion of the wound appears to be very good with a dermal layer noted this does not appear to be again deep enough to extend it to subcutaneous tissue at this point. 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 In the pump he has Dilaudid and baclofen. 12/07/17 on evaluation today patient presents for follow-up concerning his ongoing lower back thigh ulcer on the right. He states that he did not get the supplies ordered and therefore has not really been able to perform the dressing changes as directed exactly. His wife was able to get some Boarder Foam Dressing's from the drugstore and subsequently has been using hydrogel which did help to a degree in the wound does appear to be able smaller. There is actually more drainage this week noted than previous. 12/21/17 on evaluation today patient appears to be doing rather well in regard to his right gluteal ulcer. He has been tolerating the dressing changes without complication. There does not appear to be any evidence of infection at this point in  time. Overall the wound does seem to be making some progress as far as the edges are concerned there's not as much in the way of overlapping of the external wound edges and he has a good epithelium to wound bed border for the most part. This however is not true right at the 12 o'clock location over the span of a little over a centimeters which actually will require debridement today to clean this away and hopefully allow it to continue to heal more appropriately. Patient History Information obtained from Patient. Family History Hypertension - Father, Stroke - Mother, No family history of Cancer, Diabetes, Heart Disease, Kidney Disease, Lung Disease, Seizures, Thyroid Problems, Tuberculosis. Social History Never smoker, Marital Status - Married, Alcohol Use - Never, Drug Use - No History, Caffeine Use - Daily. Medical And Surgical History Notes Oncologic AMERICUS, PERKEY ([id number]) Prostate cancer- currently treated with horomone therapy Review of Systems (ROS) Constitutional Symptoms (General Health) Denies complaints or symptoms of Fever, Chills. Respiratory The patient has no complaints or symptoms. Cardiovascular The patient has no complaints or symptoms. Psychiatric The patient has no complaints or symptoms. Objective Constitutional Well-nourished and well-hydrated in no acute distress. Vitals Time Taken: 8:19 AM, Height: 73 in, Weight: 210 lbs, BMI: 27.7, Temperature: 97.8 F, Pulse: 55 bpm, Respiratory Rate: 16 breaths/min, Blood Pressure: 138/66 mmHg. Respiratory normal breathing without difficulty. Psychiatric this patient is able to make decisions and demonstrates good insight into disease process. Alert and Oriented x 3. pleasant and cooperative. General Notes: Patient's wound today did require sharp debridement again at the 12 o'clock location to remove away some of the skin and subcutaneous tissue where this was more overlapping and ragged and not really  attaching to the wound bed allowing for good epithelialization. Patient tolerated this without any pain. Unfortunately the bleeding was not able to completely resolve just with pressure alone I did have to utilize some silver nitrate at this area although I think this will be okay in the end I did explain to patient and his wife that this is something that will improve although the discoloration may stay for a few days to  eventually see this come off. Integumentary (Hair, Skin) Wound #1 status is Open. Original cause of wound was Pressure Injury. The wound is located on the Right Gluteal fold. The wound measures 4.2cm length x 3.6cm width x 0.1cm depth; 11.875cm^2 area and 1.188cm^3 volume. There is Fat Layer (Subcutaneous Tissue) Exposed exposed. There is no tunneling or undermining noted. There is a large amount of serous drainage noted. The wound margin is flat and intact. There is large (67-100%) pink granulation within the wound bed. There is a small (1-33%) amount of necrotic tissue within the wound bed including Adherent Slough. The periwound skin appearance exhibited: Maceration. The periwound skin appearance did not exhibit: Callus, Crepitus, Excoriation, Induration, Rash, Scarring, Dry/Scaly, Atrophie Blanche, Cyanosis, Ecchymosis, Hemosiderin Staining, Mottled, Pallor, Rubor, Erythema. Periwound temperature was noted as No Abnormality. [name], [name] ([id number]) Assessment Active Problems ICD-10 Paraplegia, unspecified Pressure ulcer of right buttock, stage 3 Unspecified injury to unspecified level of lumbar spinal cord, initial encounter Essential (primary) hypertension Procedures Wound #1 Pre-procedure diagnosis of Wound #1 is a Pressure Ulcer located on the Right Gluteal fold . There was a Excisional Skin/Subcutaneous Tissue Debridement with a total area of 15.12 sq cm performed by [name], Knoah Nedeau E., [name]. With the following instrument(s): Curette to remove Viable and  Non-Viable tissue/material. Material removed includes Subcutaneous Tissue, Slough, Skin: Dermis, Skin: Epidermis, and Biofilm after achieving pain control using Other (lidocaine 4%). No specimens were taken. A time out was conducted at 08:35, prior to the start of the procedure. A Minimum amount of bleeding was controlled with Pressure. The procedure was tolerated well with a pain level of 0 throughout and a pain level of 0 following the procedure. Patient s Level of Consciousness post procedure was recorded as Awake and Alert. Post Debridement Measurements: 4.2cm length x 3.6cm width x 0.2cm depth; 2.375cm^3 volume. Post debridement Stage noted as Category/Stage III. Character of Wound/Ulcer Post Debridement is stable. Post procedure Diagnosis Wound #1: Same as Pre-Procedure Plan Wound Cleansing: Wound #1 Right Gluteal fold: Clean wound with Normal Saline. Anesthetic (add to Medication List): Wound #1 Right Gluteal fold: Topical Lidocaine 4% cream applied to wound bed prior to debridement (In Clinic Only). Skin Barriers/Peri-Wound Care: Wound #1 Right Gluteal fold: Skin Prep Primary Wound Dressing: Wound #1 Right Gluteal fold: Silver Collagen Secondary Dressing: Wound #1 Right Gluteal fold: Boardered Foam Dressing Dressing Change Frequency: Wound #1 Right Gluteal fold: Change dressing every other day. Follow-up Appointments: Wound #1 Right Gluteal fold: Return Appointment in 2 weeks. [name], [name] ([id number]) Off-Loading: Wound #1 Right Gluteal fold: Turn and reposition every 2 hours We are gonna see the patient for reevaluation in one weeks time to see were things stand. He's in agreement with this plan. If he's doing well at that point and there's good epithelialization with attachment of the wound edges all around I will likely proceed to a two week follow-up visit at that point. He's in agreement the plan. Otherwise will see were things stand in one week. Please see  above for specific wound care orders. We will see patient for re-evaluation in 1 week(s) here in the clinic. If anything worsens or changes patient will contact our office for additional recommendations. Electronic Signature(s) Signed: 12/21/2017 9:07:56 PM By: Worthy Keeler [name] Entered By: Worthy Keeler on 12/21/2017 09:00:54 [name], [name] ([id number]) -------------------------------------------------------------------------------- ROS/PFSH Details Patient Name: [name] Date of Service: 12/21/2017 8:00 AM Medical Record Number: [id number] Patient Account Number: [id number] Date of Birth/Sex: 1950/08/28 (  67 y.o. M) Treating RN: Roger Shelter Primary Care Provider: [name] Other Clinician: Referring Provider: [name] Treating Provider/Extender: [name], Jeany Seville Weeks in Treatment: 3 Information Obtained From Patient Wound History Do you currently have one or more open woundso Yes How many open wounds do you currently haveo 1 Approximately how long have you had your woundso 1 month How have you been treating your wound(s) until nowo cream Has your wound(s) ever healed and then re-openedo No Have you had any lab work done in the past montho No Have you tested positive for an antibiotic resistant organism (MRSA, VRE)o No Have you tested positive for osteomyelitis (bone infection)o No Have you had any tests for circulation on your legso No Constitutional Symptoms (General Health) Complaints and Symptoms: Negative for: Fever; Chills Eyes Medical History: Positive for: Cataracts - both removed Negative for: Glaucoma; Optic Neuritis Ear/Nose/Mouth/Throat Medical History: Negative for: Chronic sinus problems/congestion; Middle ear problems Hematologic/Lymphatic Medical History: Negative for: Anemia; Hemophilia; Human Immunodeficiency Virus; Lymphedema Respiratory Complaints and Symptoms: No Complaints or Symptoms Medical History: Negative  for: Aspiration; Asthma; Chronic Obstructive Pulmonary Disease (COPD); Pneumothorax; Sleep Apnea; Tuberculosis Cardiovascular Complaints and Symptoms: No Complaints or Symptoms Medical History: Positive for: Hypertension - takes medication [name], [name]. ([id number]) Negative for: Angina; Arrhythmia; Congestive Heart Failure; Coronary Artery Disease; Deep Vein Thrombosis; Hypotension; Myocardial Infarction; Peripheral Arterial Disease; Peripheral Venous Disease; Phlebitis; Vasculitis Gastrointestinal Medical History: Negative for: Cirrhosis ; Colitis; Crohnos; Hepatitis A; Hepatitis B; Hepatitis C Endocrine Medical History: Negative for: Type I Diabetes; Type II Diabetes Genitourinary Medical History: Negative for: End Stage Renal Disease Immunological Medical History: Negative for: Lupus Erythematosus; Raynaudos; Scleroderma Integumentary (Skin) Medical History: Negative for: History of Burn; History of pressure wounds Musculoskeletal Medical History: Negative for: Gout; Rheumatoid Arthritis; Osteoarthritis; Osteomyelitis Neurologic Medical History: Positive for: Paraplegia - waist down Negative for: Dementia; Neuropathy; Quadriplegia; Seizure Disorder Oncologic Medical History: Negative for: Received Chemotherapy; Received Radiation Past Medical History Notes: Prostate cancer- currently treated with horomone therapy Psychiatric Complaints and Symptoms: No Complaints or Symptoms Medical History: Negative for: Anorexia/bulimia; Confinement Anxiety HBO Extended History Items Eyes: Cataracts Immunizations [name], BLASIUS ([id number]) Pneumococcal Vaccine: Received Pneumococcal Vaccination: No Implantable Devices Family and Social History Cancer: No; Diabetes: No; Heart Disease: No; Hypertension: Yes - Father; Kidney Disease: No; Lung Disease: No; Seizures: No; Stroke: Yes - Mother; Thyroid Problems: No; Tuberculosis: No; Never smoker; Marital Status -  Married; Alcohol Use: Never; Drug Use: No History; Caffeine Use: Daily; Advanced Directives: Yes (Copy provided); Patient does not want information on Advanced Directives; Living Will: Yes (Copy provided) Physician Affirmation I have reviewed and agree with the above information. Electronic Signature(s) Signed: 12/21/2017 5:11:24 PM By: Roger Shelter Signed: 12/21/2017 9:07:56 PM By: Worthy Keeler [name] Entered By: Worthy Keeler on 12/21/2017 08:59:57 MARDELL, CRAGG ([id number]) -------------------------------------------------------------------------------- SuperBill Details Patient Name: [name] Date of Service: 12/21/2017 Medical Record Number: [id number] Patient Account Number: [id number] Date of Birth/Sex: 1951/05/24 (67 y.o. 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

## 2017-12-28 ENCOUNTER — Encounter: Payer: Medicare Other | Admitting: Physician Assistant

## 2017-12-28 DIAGNOSIS — L89313 Pressure ulcer of right buttock, stage 3: Secondary | ICD-10-CM | POA: Diagnosis not present

## 2017-12-31 NOTE — Progress Notes (Signed)
[name], [name] ([id number]) Visit Report for 12/28/2017 Arrival Information Details Patient Name: [name] Date of Service: 12/28/2017 8:00 AM Medical Record Number: [id number] Patient Account Number: [id number] Date of Birth/Sex: 05/04/1951 (67 y.o. M) Treating RN: [name] Primary Care [name]: [name] Other Clinician: Referring [name]: [name] [name], [name] in Treatment: 4 Visit Information History Since Last Visit All ordered tests and consults were completed: No Patient Arrived: Wheel Chair Added or deleted any medications: No Arrival Time: 08:04 Any new allergies or adverse reactions: No Accompanied [name]: wife Had a fall or experienced change in No Transfer Assistance: EasyPivot Patient activities of daily living that may affect Lift risk of falls: Patient Identification Verified: Yes Signs or symptoms of abuse/neglect since last visito No Secondary Verification Process Yes Hospitalized since last visit: No Completed: Implantable device outside of the clinic excluding No Patient Requires Transmission-Based No cellular tissue based products placed in the center Precautions: since last visit: Patient Has Alerts: Yes Has Dressing in Place as Prescribed: Yes Patient Alerts: NOT Diabetic Pain Present Now: Yes Electronic Signature(s) Signed: 12/28/2017 4:38:51 PM [name]: [name]: [name] on 12/28/2017 08:05:16 [name] ([id number]) -------------------------------------------------------------------------------- Clinic Level of Care Assessment Details Patient Name: [name] Date of Service: 12/28/2017 8:00 AM Medical Record Number: [id number] Patient Account Number: [id number] Date of Birth/Sex: April 02, 1951 (67 y.o. M) Treating RN: [name] Primary Care [name]: [name] Other Clinician: Referring [name]: [name] Treating  [name]/Extender: [name], [name] in Treatment: 4 Clinic Level of Care Assessment Items TOOL 4 Quantity Score X - Use when only an EandM is performed on FOLLOW-UP visit 1 0 ASSESSMENTS - Nursing Assessment / Reassessment X - Reassessment of Co-morbidities (includes updates in patient status) 1 10 X- 1 5 Reassessment of Adherence to Treatment Plan ASSESSMENTS - Wound and Skin Assessment / Reassessment X - Simple Wound Assessment / Reassessment - one wound 1 5 []  - 0 Complex Wound Assessment / Reassessment - multiple wounds []  - 0 Dermatologic / Skin Assessment (not related to wound area) ASSESSMENTS - Focused Assessment []  - Circumferential Edema Measurements - multi extremities 0 []  - 0 Nutritional Assessment / Counseling / Intervention []  - 0 Lower Extremity Assessment (monofilament, tuning fork, pulses) []  - 0 Peripheral Arterial Disease Assessment (using hand held doppler) ASSESSMENTS - Ostomy and/or Continence Assessment and Care []  - Incontinence Assessment and Management 0 []  - 0 Ostomy Care Assessment and Management (repouching, etc.) PROCESS - Coordination of Care X - Simple Patient / Family Education for ongoing care 1 15 []  - 0 Complex (extensive) Patient / Family Education for ongoing care []  - 0 Staff obtains Programmer, systems, Records, Test Results / Process Orders []  - 0 Staff telephones HHA, Nursing Homes / Clarify orders / etc []  - 0 Routine Transfer to another Facility (non-emergent condition) []  - 0 Routine Hospital Admission (non-emergent condition) []  - 0 New Admissions / Biomedical engineer / Ordering NPWT, Apligraf, etc. []  - 0 Emergency Hospital Admission (emergent condition) X- 1 10 Simple Discharge Coordination [name], [name]. ([id number]) []  - 0 Complex (extensive) Discharge Coordination PROCESS - Special Needs []  - Pediatric / Minor Patient Management 0 []  - 0 Isolation Patient Management []  - 0 Hearing / Language / Visual special  needs []  - 0 Assessment of Community assistance (transportation, D/C planning, etc.) []  - 0 Additional assistance / Altered mentation []  - 0 Support Surface(s) Assessment (bed, cushion, seat, etc.) INTERVENTIONS - Wound Cleansing /  Measurement X - Simple Wound Cleansing - one wound 1 5 []  - 0 Complex Wound Cleansing - multiple wounds X- 1 5 Wound Imaging (photographs - any number of wounds) []  - 0 Wound Tracing (instead of photographs) X- 1 5 Simple Wound Measurement - one wound []  - 0 Complex Wound Measurement - multiple wounds INTERVENTIONS - Wound Dressings X - Small Wound Dressing one or multiple wounds 1 10 []  - 0 Medium Wound Dressing one or multiple wounds []  - 0 Large Wound Dressing one or multiple wounds []  - 0 Application of Medications - topical []  - 0 Application of Medications - injection INTERVENTIONS - Miscellaneous []  - External ear exam 0 []  - 0 Specimen Collection (cultures, biopsies, blood, body fluids, etc.) []  - 0 Specimen(s) / Culture(s) sent or taken to Lab for analysis []  - 0 Patient Transfer (multiple staff / Civil Service fast streamer / Similar devices) []  - 0 Simple Staple / Suture removal (25 or less) []  - 0 Complex Staple / Suture removal (26 or more) []  - 0 Hypo / Hyperglycemic Management (close monitor of Blood Glucose) []  - 0 Ankle / Brachial Index (ABI) - do not check if billed separately X- 1 5 Vital Signs Boniface, Fawaz J. ([id number]) Has the patient been seen at the hospital within the last three years: Yes Total Score: 75 Level Of Care: New/Established - Level 2 Electronic Signature(s) Signed: 12/29/2017 11:44:07 AM [name]: [name] Entered [name]: [name] on 12/28/2017 08:35:49 [name] ([id number]) -------------------------------------------------------------------------------- Encounter Discharge Information Details Patient Name: [name] Date of Service: 12/28/2017 8:00 AM Medical Record Number:  [id number] Patient Account Number: [id number] Date of Birth/Sex: 1951-06-23 (67 y.o. M) Treating RN: [name] Primary Care Kailie Polus: [name] Other Clinician: Referring [name]: [name] Treating Josie Mesa/Extender: [name], [name] in Treatment: 4 Encounter Discharge Information Items Discharge Condition: Stable Ambulatory Status: Wheelchair Discharge Destination: Home Transportation: Private Auto Accompanied [name]: wife Schedule Follow-up Appointment: Yes Clinical Summary of Care: Electronic Signature(s) Signed: 12/28/2017 4:38:51 PM [name]: [name]: [name] on 12/28/2017 08:48:44 [name] ([id number]) -------------------------------------------------------------------------------- Lower Extremity Assessment Details Patient Name: [name] Date of Service: 12/28/2017 8:00 AM Medical Record Number: [id number] Patient Account Number: [id number] Date of Birth/Sex: Oct 22, 1950 (67 y.o. M) Treating RN: [name] Primary Care Janeice Stegall: [name] Other Clinician: Referring Jovana Rembold: [name] Treating Curlie Sittner/Extender: [name], [name] in Treatment: 4 Electronic Signature(s) Signed: 12/28/2017 4:38:51 PM [name]: [name]: [name] on 12/28/2017 08:17:12 [name], [name] ([id number]) -------------------------------------------------------------------------------- Multi Wound Chart Details Patient Name: [name] Date of Service: 12/28/2017 8:00 AM Medical Record Number: [id number] Patient Account Number: [id number] Date of Birth/Sex: May 13, 1951 (67 y.o. M) Treating RN: [name] Primary Care Kennisha Qin: [name] Other Clinician: Referring Westlyn Glaza: [name] Treating Tymir Terral/Extender: [name], [name] in Treatment: 4 Vital Signs Height(in): 73 Pulse(bpm): 49 Weight(lbs): 210 Blood Pressure(mmHg): 130/79 Body Mass  Index(BMI): 28 Temperature(F): 97.9 Respiratory Rate 16 (breaths/min): Photos: [1:No Photos] [N/A:N/A] Wound Location: [1:Right Gluteal fold] [N/A:N/A] Wounding Event: [1:Pressure Injury] [N/A:N/A] Primary Etiology: [1:Pressure Ulcer] [N/A:N/A] Comorbid History: [1:Cataracts, Hypertension, Paraplegia] [N/A:N/A] Date Acquired: [1:11/02/2017] [N/A:N/A] [name] of Treatment: [1:4] [N/A:N/A] Wound Status: [1:Open] [N/A:N/A] Measurements L x W x D [1:3.7x3.6x0.1] [N/A:N/A] (cm) Area (cm) : [1:10.462] [N/A:N/A] Volume (cm) : [1:1.046] [N/A:N/A] % Reduction in Area: [1:-4.10%] [N/A:N/A] % Reduction in Volume: [1:-4.10%] [N/A:N/A] Classification: [1:Category/Stage III] [N/A:N/A] Exudate Amount: [1:Large] [N/A:N/A] Exudate Type: [1:Serous] [N/A:N/A] Exudate Color: [1:amber] [N/A:N/A] Wound Margin: [1:Flat  and Intact] [N/A:N/A] Granulation Amount: [1:Large (67-100%)] [N/A:N/A] Granulation Quality: [1:Pink] [N/A:N/A] Necrotic Amount: [1:Small (1-33%)] [N/A:N/A] Exposed Structures: [1:Fat Layer (Subcutaneous Tissue) Exposed: Yes Fascia: No Tendon: No Muscle: No Joint: No Bone: No] [N/A:N/A] Epithelialization: [1:Small (1-33%)] [N/A:N/A] Periwound Skin Texture: [1:Excoriation: No Induration: No Callus: No Crepitus: No Rash: No Scarring: No] [N/A:N/A] Periwound Skin Moisture: Maceration: Yes N/A N/A Dry/Scaly: No Periwound Skin Color: Atrophie Blanche: No N/A N/A Cyanosis: No Ecchymosis: No Erythema: No Hemosiderin Staining: No Mottled: No Pallor: No Rubor: No Temperature: No Abnormality N/A N/A Tenderness on Palpation: No N/A N/A Wound Preparation: Ulcer Cleansing: N/A N/A Rinsed/Irrigated with Saline Topical Anesthetic Applied: Other: lidocaine 4% Treatment Notes Electronic Signature(s) Signed: 12/29/2017 11:44:07 AM [name]: [name] Entered [name]: [name] on 12/28/2017 08:30:37 [name], [name]  ([id number]) -------------------------------------------------------------------------------- [place] Details Patient Name: [name] Date of Service: 12/28/2017 8:00 AM Medical Record Number: [id number] Patient Account Number: [id number] Date of Birth/Sex: Sep 27, 1950 (67 y.o. M) Treating RN: [name] Primary Care Katalaya Beel: [name] Other Clinician: Referring Dontez Hauss: [name] Treating Trina Asch/Extender: [name], [name] in Treatment: 4 Active Inactive ` Orientation to the Wound Care Program Nursing Diagnoses: Knowledge deficit related to the wound healing center program Goals: Patient/caregiver will verbalize understanding of the [facility] Program Date Initiated: 11/30/2017 Target Resolution Date: 12/21/2017 Goal Status: Active Interventions: Provide education on orientation to the wound center Notes: ` Wound/Skin Impairment Nursing Diagnoses: Impaired tissue integrity Goals: Patient/caregiver will verbalize understanding of skin care regimen Date Initiated: 11/30/2017 Target Resolution Date: 12/21/2017 Goal Status: Active Ulcer/skin breakdown will have a volume reduction of 30% [name] week 4 Date Initiated: 11/30/2017 Target Resolution Date: 12/21/2017 Goal Status: Active Interventions: Assess patient/caregiver ability to obtain necessary supplies Assess patient/caregiver ability to perform ulcer/skin care regimen upon admission and as needed Assess ulceration(s) every visit Treatment Activities: Skin care regimen initiated : 11/30/2017 Notes: Electronic Signature(s) Signed: 12/29/2017 11:44:07 AM [name]: Patrick North, [name] ([id number]) Entered [name]: [name] on 12/28/2017 08:30:29 [name], [name] ([id number]) -------------------------------------------------------------------------------- Pain Assessment Details Patient Name: [name] Date of Service: 12/28/2017 8:00  AM Medical Record Number: [id number] Patient Account Number: [id number] Date of Birth/Sex: [birth year] (67 y.o. M) Treating RN: [name] Primary Care Senaya Dicenso: [name] Other Clinician: Referring Deborha Moseley: [name] Treating Imogen Maddalena/Extender: [name], [name] in Treatment: 4 Active Problems Location of Pain Severity and Description of Pain Patient Has Paino Yes Site Locations Pain Location: Pain in Ulcers Rate the pain. Current Pain Level: 7 Character of Pain Describe the Pain: Aching Pain Management and Medication Current Pain Management: Notes Topical or injectable lidocaine is offered to patient for acute pain when surgical debridement is performed. If needed, Patient is instructed to use over the counter pain medication for the following 24-48 hours after debridement. Wound care MDs do not prescribed pain medications. Patient has chronic pain or uncontrolled pain. Patient has been instructed to make an appointment with their Primary Care Physician for pain management. Electronic Signature(s) Signed: 12/28/2017 4:38:51 PM [name]: [name]: [name] on 12/28/2017 08:05:56 [name] ([id number]) -------------------------------------------------------------------------------- Patient/Caregiver Education Details Patient Name: [name] Date of Service: 12/28/2017 8:00 AM Medical Record Number: [id number] Patient Account Number: [id number] Date of Birth/Gender: 07-23-1950 (67 y.o. M) Treating RN: [name] Primary Care Physician: [name] Other Clinician: Referring Physician: [name] Treating Physician/Extender: Sharalyn Ink in Treatment: 4 Education Assessment Education Provided To: Patient Education Topics Provided Wound/Skin Impairment: Handouts:  Caring for Your Ulcer, Other: change dressing as ordered Methods: Demonstration, Explain/Verbal Responses: State content  correctly Electronic Signature(s) Signed: 12/28/2017 4:38:51 PM [name]: [name]: [name] on 12/28/2017 08:49:03 [name], [name] ([id number]) -------------------------------------------------------------------------------- Wound Assessment Details Patient Name: [name] Date of Service: 12/28/2017 8:00 AM Medical Record Number: [id number] Patient Account Number: [id number] Date of Birth/Sex: [birth year] (67 y.o. M) Treating RN: [name] Primary Care Yevette Knust: [name] Other Clinician: Referring Parris Cudworth: [name] Treating Lennis Korb/Extender: [name], [name] in Treatment: 4 Wound Status Wound Number: 1 Primary Etiology: Pressure Ulcer Wound Location: Right Gluteal fold Wound Status: Open Wounding Event: Pressure Injury Comorbid History: Cataracts, Hypertension, Paraplegia Date Acquired: 11/02/2017 [name] Of Treatment: 4 Clustered Wound: No Photos Photo Uploaded [name]: [name] on 12/28/2017 16:36:49 Wound Measurements Length: (cm) 3.7 Width: (cm) 3.6 Depth: (cm) 0.1 Area: (cm) 10.462 Volume: (cm) 1.046 % Reduction in Area: -4.1% % Reduction in Volume: -4.1% Epithelialization: Small (1-33%) Tunneling: No Undermining: No Wound Description Classification: Category/Stage III Wound Margin: Flat and Intact Exudate Amount: Large Exudate Type: Serous Exudate Color: amber Foul Odor After Cleansing: No Slough/Fibrino Yes Wound Bed Granulation Amount: Large (67-100%) Exposed Structure Granulation Quality: Pink Fascia Exposed: No Necrotic Amount: Small (1-33%) Fat Layer (Subcutaneous Tissue) Exposed: Yes Necrotic Quality: Adherent Slough Tendon Exposed: No Muscle Exposed: No Joint Exposed: No Bone Exposed: No Periwound Skin Texture Texture Color No Abnormalities Noted: No No Abnormalities Noted: No Callus: No Atrophie Blanche: No ALMALIK, [name]. ([id number]) Crepitus: No Cyanosis:  No Excoriation: No Ecchymosis: No Induration: No Erythema: No Rash: No Hemosiderin Staining: No Scarring: No Mottled: No Pallor: No Moisture Rubor: No No Abnormalities Noted: No Dry / Scaly: No Temperature / Pain Maceration: Yes Temperature: No Abnormality Wound Preparation Ulcer Cleansing: Rinsed/Irrigated with Saline Topical Anesthetic Applied: Other: lidocaine 4%, Treatment Notes Wound #1 (Right Gluteal fold) 1. Cleansed with: Clean wound with Normal Saline 2. Anesthetic Topical Lidocaine 4% cream to wound bed prior to debridement 4. Dressing Applied: Prisma Ag 5. Secondary Dressing Applied Bordered Foam Dressing Electronic Signature(s) Signed: 12/28/2017 4:38:51 PM [name]: [name]: [name] on 12/28/2017 08:16:17 [name], [name] ([id number]) -------------------------------------------------------------------------------- Vitals Details Patient Name: [name] Date of Service: 12/28/2017 8:00 AM Medical Record Number: [id number] Patient Account Number: [id number] Date of Birth/Sex: [birth year] (67 y.o. M) Treating RN: [name] Primary Care Dona Klemann: [name] Other Clinician: Referring Masae Lukacs: [name] Treating Adewale Pucillo/Extender: [name], [name] in Treatment: 4 Vital Signs Time Taken: 08:06 Temperature (F): 97.9 Height (in): 73 Pulse (bpm): 64 Weight (lbs): 210 Respiratory Rate (breaths/min): 16 Body Mass Index (BMI): 27.7 Blood Pressure (mmHg): 130/79 Reference Range: 80 - 120 mg / dl Electronic Signature(s) Signed: 12/28/2017 4:38:51 PM [name]: [name]: [name] on 12/28/2017 08:11:29

## 2017-12-31 NOTE — Progress Notes (Addendum)
[name] ([id number]) Visit Report for 12/28/2017 Chief Complaint Document Details Patient Name: [name], [name] Date of Service: 12/28/2017 8:00 AM Medical Record Number: [id number] Patient Account Number: [id number] Date of Birth/Sex: 09/28/1950 (67 y.o. M) Treating RN: Roger Shelter Primary Care Provider: [name] Other Clinician: Referring Provider: [name] Treating Provider/Extender: [name], Christie Viscomi Weeks in Treatment: 4 Information Obtained from: Patient Chief Complaint Right gluteal fold ulcer Electronic Signature(s) Signed: 12/28/2017 11:45:54 PM By: Worthy Keeler [name] Entered By: Worthy Keeler on 12/28/2017 08:18:58 [name], [name] ([id number]) -------------------------------------------------------------------------------- HPI Details Patient Name: [name] Date of Service: 12/28/2017 8:00 AM Medical Record Number: [id number] Patient Account Number: [id number] Date of Birth/Sex: [birth year] (67 y.o. 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 He states that there is no history of diabetes that he is aware of. He does have issues with his prostate and is currently receiving treatment for this by way of oral medication. With that being said I do not have a lot of details in that regard. Nonetheless the patient presents today as a result of having been referred to [place] by another provider initially home health was set to come out and take care of his wound although due to the fact  that he apparently drives he's not able to receive home health. His wife is therefore trying to help take care of this wound within although they have been struggling with what exactly to do at this point. She states that she can do some things but she is definitely not a nurse and does have some issues with looking at blood. The good news is the wound does not appear to be too deep and is fairly superficial at this point. There is no slough noted there is some nonviable skin noted around the surface of the wound and the perimeter at this point. The central portion of the wound appears to be very good with a dermal layer noted this does not appear to be again deep enough to extend it to subcutaneous tissue at this point. 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 In the pump he has Dilaudid and baclofen. 12/07/17 on evaluation today patient presents for follow-up concerning his ongoing lower back thigh ulcer on the right. He states that he did not get the supplies ordered and therefore has not really been able to perform the dressing changes as directed exactly. His wife was able to get some Boarder Foam Dressing's from the drugstore and subsequently has been using hydrogel which did help to a degree in the wound does appear to be able smaller. There is actually more drainage this week noted than previous. 12/21/17 on evaluation today patient appears to be doing rather well in regard to his right gluteal ulcer. He has been tolerating the dressing changes without complication. There does not appear to be any evidence of infection at this point in time. Overall the wound does seem to be making some progress as far as the edges  are concerned there's not as much in the way of overlapping of the external wound edges and he has a good epithelium to wound bed border for the most part. This however is not  true right at the 12 o'clock location over the span of a little over a centimeters which actually will require debridement today to clean this away and hopefully allow it to continue to heal more appropriately. 12/28/17 on evaluation today patient appears to be doing rather well in regard to his ulcer in the right gluteal region. He's been tolerating the dressing changes without complication. Apparently he has had some difficulty getting his dressing material. Apparently there's been some confusion with ordering we're gonna check into this. Nonetheless overall he's been showing signs of improvement which is good news. Debridement is not required today. Electronic Signature(s) Signed: 01/05/2018 10:01:44 AM By: Worthy Keeler [name] Previous Signature: 12/28/2017 11:45:54 PM Version By: Worthy Keeler [name] Entered By: Worthy Keeler on 01/05/2018 10:00:42 [name], [name] ([id number]) -------------------------------------------------------------------------------- Physical Exam Details Patient Name: [name] Date of Service: 12/28/2017 8:00 AM Medical Record Number: [id number] Patient Account Number: [id number] Date of Birth/Sex: 03/14/1951 (67 y.o. M) Treating RN: Roger Shelter Primary Care Provider: [name] Other Clinician: Referring Provider: [name] Treating Provider/Extender: [name], Raydel Hosick Weeks in Treatment: 4 Constitutional Well-nourished and well-hydrated in no acute distress. Respiratory normal breathing without difficulty. clear to auscultation bilaterally. Cardiovascular regular rate and rhythm with normal S1, S2. Psychiatric this patient is able to make decisions and demonstrates good insight into disease process. Alert and Oriented x 3. pleasant and cooperative. Notes Patient's wound bed shows a good granular surface at this time there's no evidence of infection and nothing that required sharp debridement at this point. Obviously this is  good news I'm seeing good epithelial migration as well. Electronic Signature(s) Signed: 12/28/2017 11:45:54 PM By: Worthy Keeler [name] Entered By: Worthy Keeler on 12/28/2017 18:15:36 [name], [name] ([id number]) -------------------------------------------------------------------------------- Physician Orders Details Patient Name: [name] Date of Service: 12/28/2017 8:00 AM Medical Record Number: [id number] Patient Account Number: [id number] Date of Birth/Sex: Jan 14, 1951 (67 y.o. 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 Anesthetic (add to Medication List) Wound #1 Right Gluteal fold o Topical Lidocaine 4% cream applied to wound bed prior to debridement (In Clinic Only). Skin Barriers/Peri-Wound Care Wound #1 Right Gluteal fold o Skin Prep Primary Wound Dressing Wound #1 Right Gluteal fold o Silver Collagen Secondary Dressing Wound #1 Right Gluteal fold o Boardered Foam Dressing Dressing Change Frequency Wound #1 Right Gluteal fold o Change dressing every other day. Follow-up Appointments Wound #1 Right Gluteal fold o Return Appointment in 2 weeks. Off-Loading Wound #1 Right Gluteal fold o Turn and reposition every 2 hours [name], [name] ([id number]) Electronic Signature(s) Signed: 12/28/2017 11:45:54 PM By: Worthy Keeler [name] Signed: 12/29/2017 11:44:07 AM By: Roger Shelter Entered By: Roger Shelter on 12/28/2017  08:35:01 [name], [name] ([id number]) -------------------------------------------------------------------------------- Problem List Details Patient Name: [name] Date of Service: 12/28/2017 8:00 AM Medical Record Number: [id number] Patient Account Number: [id number] Date of  Birth/Sex: 1951-01-26 (67 y.o. 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, stage 3 11/30/2017 No Yes S34.109A Unspecified injury to unspecified level of lumbar spinal cord, 11/30/2017 No Yes initial encounter [place] (primary) hypertension 11/30/2017 No Yes Inactive Problems Resolved Problems Electronic Signature(s) Signed: 12/28/2017 11:45:54 PM By: Worthy Keeler [name] Entered By: Worthy Keeler on 12/28/2017 08:18:51 [name] ([id number]) -------------------------------------------------------------------------------- Progress Note Details Patient Name: [name] Date of Service: 12/28/2017 8:00 AM Medical Record Number: [id number] Patient Account Number: [id number] Date of Birth/Sex: 1951-01-08 (67 y.o. 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 He states that there is no history of diabetes that he is aware of. He does have issues with his prostate and is currently receiving treatment for this by way of oral medication. With that being said I do not have a lot of details in that regard. Nonetheless the patient presents today as a result of having been referred to [place] by another provider initially home health was set to come out and take care of his wound although due to the fact that he apparently drives he's not able to receive home health. His wife is therefore trying to help take care of this wound within although they have been struggling with what exactly to do at this point. She states that she can do some things but she is definitely not a nurse and does have some issues with looking at blood. The good news is the wound does not appear to be too deep and is fairly superficial at this point. There is no slough noted there is some nonviable skin noted around the surface of the wound and the perimeter at this point. The central portion of the wound appears to be very good with a dermal layer noted this does not appear to be again deep enough to extend it to subcutaneous tissue at this point. 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 In the pump he has Dilaudid and baclofen. 12/07/17 on evaluation today patient presents for follow-up concerning his ongoing lower back thigh ulcer on the right. He states that he did not get the supplies ordered and therefore has not really been able to perform the dressing changes as directed exactly. His wife was able to get some Boarder Foam Dressing's from the drugstore and subsequently has been using hydrogel which did help to a degree in the wound does appear to be able smaller. There is actually more drainage this week noted than previous. 12/21/17 on evaluation  today patient appears to be doing rather well in regard to his right gluteal ulcer. He has been tolerating the dressing changes without complication. There does not appear to be  any evidence of infection at this point in time. Overall the wound does seem to be making some progress as far as the edges are concerned there's not as much in the way of overlapping of the external wound edges and he has a good epithelium to wound bed border for the most part. This however is not true right at the 12 o'clock location over the span of a little over a centimeters which actually will require debridement today to clean this away and hopefully allow it to continue to heal more appropriately. 12/28/17 on evaluation today patient appears to be doing rather well in regard to his ulcer in the right gluteal region. He's been tolerating the dressing changes without complication. Apparently he has had some difficulty getting his dressing material. Apparently there's been some confusion with ordering we're gonna check into this. Nonetheless overall he's been showing signs of improvement which is good news. Debridement is not required today. Patient History Information obtained from Patient. Family History Hypertension - Father, Stroke - Mother, No family history of Cancer, Diabetes, Heart Disease, Kidney Disease, Lung Disease, Seizures, Thyroid Problems, Tuberculosis. [name], [name] ([id number]) Social History Never smoker, Marital Status - Married, Alcohol Use - Never, Drug Use - No History, Caffeine Use - Daily. Medical And Surgical History Notes Oncologic Prostate cancer- currently treated with horomone therapy Review of Systems (ROS) Constitutional Symptoms (General Health) Denies complaints or symptoms of Fever, Chills. Respiratory The patient has no complaints or symptoms. Cardiovascular The patient has no complaints or symptoms. Psychiatric The patient has no complaints or  symptoms. Objective Constitutional Well-nourished and well-hydrated in no acute distress. Vitals Time Taken: 8:06 AM, Height: 73 in, Weight: 210 lbs, BMI: 27.7, Temperature: 97.9 F, Pulse: 64 bpm, Respiratory Rate: 16 breaths/min, Blood Pressure: 130/79 mmHg. Respiratory normal breathing without difficulty. clear to auscultation bilaterally. Cardiovascular regular rate and rhythm with normal S1, S2. Psychiatric this patient is able to make decisions and demonstrates good insight into disease process. Alert and Oriented x 3. pleasant and cooperative. General Notes: Patient's wound bed shows a good granular surface at this time there's no evidence of infection and nothing that required sharp debridement at this point. Obviously this is good news I'm seeing good epithelial migration as well. Integumentary (Hair, Skin) Wound #1 status is Open. Original cause of wound was Pressure Injury. The wound is located on the Right Gluteal fold. The wound measures 3.7cm length x 3.6cm width x 0.1cm depth; 10.462cm^2 area and 1.046cm^3 volume. There is Fat Layer (Subcutaneous Tissue) Exposed exposed. There is no tunneling or undermining noted. There is a large amount of serous drainage noted. The wound margin is flat and intact. There is large (67-100%) pink granulation within the wound bed. There is a small (1-33%) amount of necrotic tissue within the wound bed including Adherent Slough. The periwound skin appearance exhibited: Maceration. The periwound skin appearance did not exhibit: Callus, Crepitus, Excoriation, Induration, Rash, Scarring, Dry/Scaly, Atrophie Blanche, Cyanosis, Ecchymosis, Hemosiderin Staining, Mottled, Pallor, Rubor, Erythema. Periwound temperature was noted as No Abnormality. [name], [name] ([id number]) Assessment Active Problems ICD-10 Paraplegia, unspecified Pressure ulcer of right buttock, stage 3 Unspecified injury to unspecified level of lumbar spinal cord, initial  encounter Essential (primary) hypertension Plan Wound Cleansing: Wound #1 Right Gluteal fold: Clean wound with Normal Saline. Anesthetic (add to Medication List): Wound #1 Right Gluteal fold: Topical Lidocaine 4% cream applied to wound bed prior to debridement (In Clinic Only). Skin Barriers/Peri-Wound Care: Wound #1 Right Gluteal fold: Skin Prep Primary  Wound Dressing: Wound #1 Right Gluteal fold: Silver Collagen Secondary Dressing: Wound #1 Right Gluteal fold: Boardered Foam Dressing Dressing Change Frequency: Wound #1 Right Gluteal fold: Change dressing every other day. Follow-up Appointments: Wound #1 Right Gluteal fold: Return Appointment in 2 weeks. Off-Loading: Wound #1 Right Gluteal fold: Turn and reposition every 2 hours I am going to suggest currently that we continue with the Current wound care measures since he seems to be doing so well. The patient is in agreement with this plan. We will subsequently see were things stand at follow-up in one weeks time. If anything changes or worsens he will contact our office for additional recommendations. Please see above for specific wound care orders. We will see patient for re-evaluation in 1 week(s) here in the clinic. If anything worsens or changes patient will contact our office for additional recommendations. Electronic Signature(s) Signed: 01/05/2018 10:01:44 AM By: Worthy Keeler [name] Previous Signature: 12/28/2017 11:45:54 PM Version By: Conchita Paris, [place] ([id number]) Entered By: Worthy Keeler on 01/05/2018 10:01:05 [name], [name] ([id number]) -------------------------------------------------------------------------------- ROS/PFSH Details Patient Name: [name] Date of Service: 12/28/2017 8:00 AM Medical Record Number: [id number] Patient Account Number: [id number] Date of Birth/Sex: 09-10-1950 (67 y.o. M) Treating RN: Roger Shelter Primary Care Provider: [name]  Other Clinician: Referring Provider: [name] Treating Provider/Extender: [name], Josepha Barbier Weeks in Treatment: 4 Information Obtained From Patient Wound History Do you currently have one or more open woundso Yes How many open wounds do you currently haveo 1 Approximately how long have you had your woundso 1 month How have you been treating your wound(s) until nowo cream Has your wound(s) ever healed and then re-openedo No Have you had any lab work done in the past montho No Have you tested positive for an antibiotic resistant organism (MRSA, VRE)o No Have you tested positive for osteomyelitis (bone infection)o No Have you had any tests for circulation on your legso No Constitutional Symptoms (General Health) Complaints and Symptoms: Negative for: Fever; Chills Eyes Medical History: Positive for: Cataracts - both removed Negative for: Glaucoma; Optic Neuritis Ear/Nose/Mouth/Throat Medical History: Negative for: Chronic sinus problems/congestion; Middle ear problems Hematologic/Lymphatic Medical History: Negative for: Anemia; Hemophilia; Human Immunodeficiency Virus; Lymphedema Respiratory Complaints and Symptoms: No Complaints or Symptoms Medical History: Negative for: Aspiration; Asthma; Chronic Obstructive Pulmonary Disease (COPD); Pneumothorax; Sleep Apnea; Tuberculosis Cardiovascular Complaints and Symptoms: No Complaints or Symptoms Medical History: Positive for: Hypertension - takes medication [name], KHADER. ([id number]) Negative for: Angina; Arrhythmia; Congestive Heart Failure; Coronary Artery Disease; Deep Vein Thrombosis; Hypotension; Myocardial Infarction; Peripheral Arterial Disease; Peripheral Venous Disease; Phlebitis; Vasculitis Gastrointestinal Medical History: Negative for: Cirrhosis ; Colitis; Crohnos; Hepatitis A; Hepatitis B; Hepatitis C Endocrine Medical History: Negative for: Type I Diabetes; Type II Diabetes Genitourinary Medical  History: Negative for: End Stage Renal Disease Immunological Medical History: Negative for: Lupus Erythematosus; Raynaudos; Scleroderma Integumentary (Skin) Medical History: Negative for: History of Burn; History of pressure wounds Musculoskeletal Medical History: Negative for: Gout; Rheumatoid Arthritis; Osteoarthritis; Osteomyelitis Neurologic Medical History: Positive for: Paraplegia - waist down Negative for: Dementia; Neuropathy; Quadriplegia; Seizure Disorder Oncologic Medical History: Negative for: Received Chemotherapy; Received Radiation Past Medical History Notes: Prostate cancer- currently treated with horomone therapy Psychiatric Complaints and Symptoms: No Complaints or Symptoms Medical History: Negative for: Anorexia/bulimia; Confinement Anxiety HBO Extended History Items Eyes: Cataracts Immunizations [name], [name] ([id number]) Pneumococcal Vaccine: Received Pneumococcal Vaccination: No Implantable Devices Family and Social History Cancer: No; Diabetes: No; Heart Disease: No;  Hypertension: Yes - Father; Kidney Disease: No; Lung Disease: No; Seizures: No; Stroke: Yes - Mother; Thyroid Problems: No; Tuberculosis: No; Never smoker; Marital Status - Married; Alcohol Use: Never; Drug Use: No History; Caffeine Use: Daily; Advanced Directives: Yes (Copy provided); Patient does not want information on Advanced Directives; Living Will: Yes (Copy provided) Physician Affirmation I have reviewed and agree with the above information. Electronic Signature(s) Signed: 12/28/2017 11:45:54 PM By: Worthy Keeler [name] Signed: 12/29/2017 11:44:07 AM By: Roger Shelter Entered By: Worthy Keeler on 12/28/2017 18:15:18 [name], [name] ([id number]) -------------------------------------------------------------------------------- SuperBill Details Patient Name: [name] Date of Service: 12/28/2017 Medical Record Number: [id number] Patient Account Number:  [id number] Date of Birth/Sex: [birth year] (67 y.o. 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, stage 3 S34.109A Unspecified injury to unspecified level of lumbar spinal cord, initial encounter [place] (primary) hypertension Facility Procedures CPT4 Code: [id number] Description: 50932 - WOUND CARE VISIT-LEV 2 EST PT Modifier: Quantity: 1 Physician Procedures CPT4 Code Description: [id number] - WC PHYS LEVEL 3 - EST PT ICD-10 Diagnosis Description G82.20 Paraplegia, unspecified 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

## 2018-01-04 ENCOUNTER — Encounter: Payer: Medicare Other | Admitting: Physician Assistant

## 2018-01-04 DIAGNOSIS — L89313 Pressure ulcer of right buttock, stage 3: Secondary | ICD-10-CM | POA: Diagnosis not present

## 2018-01-06 NOTE — Progress Notes (Signed)
[name], [name] ([id number]) Visit Report for 01/04/2018 Chief Complaint Document Details Patient Name: [name], [name] Date of Service: 01/04/2018 9:00 AM Medical Record Number: [id number] Patient Account Number: [id number] Date of Birth/Sex: Oct 16, 1950 (67 y.o. M) Treating RN: Roger Shelter Primary Care Provider: [name] Other Clinician: Referring Provider: [name] Treating Provider/Extender: [name],  Weeks in Treatment: 5 Information Obtained from: Patient Chief Complaint Right gluteal fold ulcer Electronic Signature(s) Signed: 01/05/2018 1:56:08 AM By: Worthy Keeler [name] Entered By: Worthy Keeler on 01/04/2018 09:20:37 [name], [name] ([id number]) -------------------------------------------------------------------------------- Debridement Details Patient Name: [name] Date of Service: 01/04/2018 9:00 AM Medical Record Number: [id number] Patient Account Number: [id number] Date of Birth/Sex: [birth year] (67 y.o. M) Treating RN: Roger Shelter Primary Care Provider: [name] Other Clinician: Referring Provider: [name] Treating Provider/Extender: [name],  Weeks in Treatment: 5 Debridement Performed for Wound #1 Right Gluteal fold Assessment: Performed By: Physician [name],  E., [name] Debridement Type: Debridement Pre-procedure Verification/Time Yes - 09:29 Out Taken: Start Time: 09:29 Pain Control: Other : lidocaine 4% Total Area Debrided (L x W): 4 (cm) x 3.4 (cm) = 13.6 (cm) Tissue and other material Viable, Non-Viable, Slough, Subcutaneous, Biofilm, Slough debrided: Level: Skin/Subcutaneous Tissue Debridement Description: Excisional Instrument: Curette Bleeding: Minimum Hemostasis Achieved: Pressure End Time: 09:29 Procedural Pain: 0 Post Procedural Pain: 0 Response to Treatment: Procedure was tolerated well Level of Consciousness: Awake and Alert Post Procedure  Vitals: Temperature: 98.5 Pulse: 65 Respiratory Rate: 16 Blood Pressure: Systolic Blood Pressure: 973 Diastolic Blood Pressure: 79 Post Debridement Measurements of Total Wound Length: (cm) 4 Stage: Category/Stage III Width: (cm) 3.4 Depth: (cm) 0.3 Volume: (cm) 3.204 Character of Wound/Ulcer Post Stable Debridement: Post Procedure Diagnosis Same as Pre-procedure Electronic Signature(s) Signed: 01/04/2018 5:06:21 PM By: Roger Shelter Signed: 01/05/2018 1:56:08 AM By: Worthy Keeler [name] Entered By: Roger Shelter on 01/04/2018 09:30:31 [name], [name] ([id number]) [name], [name] ([id number]) -------------------------------------------------------------------------------- HPI Details Patient Name: [name] Date of Service: 01/04/2018 9:00 AM Medical Record Number: [id number] Patient Account Number: [id number] Date of Birth/Sex: April 28, 1951 (67 y.o. 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 He states that there is no history of diabetes that he is aware of. He does have issues with his prostate and is currently receiving treatment for this by way of oral medication. With that being said I do not have a lot of details in that regard. Nonetheless the patient presents today as a result of having been referred to [place] by another provider initially home health was set to come out and take care of his wound although due to the fact that he apparently drives he's not able to receive home health. His wife is therefore  trying to help take care of this wound within although they have been struggling with what exactly to do at this point. She states that she can do some things but she is definitely not a nurse and does have some issues with looking at blood. The good news is the wound does not appear to be too deep and is fairly superficial at  this point. There is no slough noted there is some nonviable skin noted around the surface of the wound and the perimeter at this point. The central portion of the wound appears to be very good with a dermal layer noted this does not appear to be again deep enough to extend it to subcutaneous tissue at this point. 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 In the pump he has Dilaudid and baclofen. 12/07/17 on evaluation today patient presents for follow-up concerning his ongoing lower back thigh ulcer on the right. He states that he did not get the supplies ordered and therefore has not really been able to perform the dressing changes as directed exactly. His wife was able to get some Boarder Foam Dressing's from the drugstore and subsequently has been using hydrogel which did help to a degree in the wound does appear to be able smaller. There is actually more drainage this week noted than previous. 12/21/17 on evaluation today patient appears to be doing rather well in regard to his right gluteal ulcer. He has been tolerating the dressing changes without complication. There does not appear to be any evidence of infection at this point in time. Overall the wound does seem to be making some progress as far as the edges are concerned there's not as much in the way of overlapping of the external wound edges and he has a good epithelium to wound bed border for the most part. This however is not true right at the 12 o'clock location over the span of a little over a centimeters  which actually will require debridement today to clean this away and hopefully allow it to continue to heal more appropriately. 12/28/17 on evaluation today patient appears to be doing rather well in regard to his ulcer in the left gluteal region. He's been tolerating the dressing changes without complication. Apparently he has had some difficulty getting his dressing material. Apparently there's been some confusion with ordering we're gonna check into this. Nonetheless overall he's been showing signs of improvement which is good news. Debridement is not required today. 01/04/18 on evaluation today patient presents for follow-up concerning his right gluteal ulcer. He has been tolerating the dressing changes fairly well. On inspection today it appears he may actually have some maceration them concerned about the fact that he may be developing too much moisture in and around the wound bed which can cause delay in healing. With that being said he unfortunately really has not showed significant signs of improvement since last week's evaluation in fact this may even be just the little bit/slightly larger. Nonetheless he's been having a lot of discomfort I'm not sure this is even related to the wound as he has no pain when I'm to breeding or otherwise cleaning the wound during evaluation today. Nonetheless this is something that we did recommend he talked to his pain specialist concerning. Electronic Signature(s) Signed: 01/05/2018 1:56:08 AM By: Worthy Keeler [name] Entered By: Worthy Keeler on 01/05/2018 01:31:45 [name], [name] ([id number]) -------------------------------------------------------------------------------- Physical Exam Details Patient Name: [name] Date of Service: 01/04/2018 9:00 AM Medical Record Number: [id number] Patient Account Number: [id number] Date of Birth/Sex: [birth year] (67 y.o. M) Treating RN: Roger Shelter Primary Care Provider: [name] Other  Clinician: Referring Provider: [name] Treating Provider/Extender: [name],  Weeks in Treatment: 5 Constitutional Well-nourished and well-hydrated in no acute distress. Respiratory  normal breathing without difficulty. Psychiatric this patient is able to make decisions and demonstrates good insight into disease process. Alert and Oriented x 3. pleasant and cooperative. Notes Patient's wound bed did show some maceration around the edges unfortunately. There did not appear to be any evidence of infection at this time which was good news. Overall I'm very pleased with the appearance of the wound although I did have to sharply debride away some Slough on the surface and some of the periphery tissue which was macerated and overhanging the actual edges of the wound. He tolerated this again with no discomfort. Electronic Signature(s) Signed: 01/05/2018 1:56:08 AM By: Worthy Keeler [name] Entered By: Worthy Keeler on 01/05/2018 01:32:34 [name], [name] ([id number]) -------------------------------------------------------------------------------- Physician Orders Details Patient Name: [name] Date of Service: 01/04/2018 9:00 AM Medical Record Number: [id number] Patient Account Number: [id number] Date of Birth/Sex: 07-09-1951 (67 y.o. 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 Anesthetic (add to Medication List) Wound #1 Right Gluteal fold o Topical  Lidocaine 4% cream applied to wound bed prior to debridement (In Clinic Only). Skin Barriers/Peri-Wound Care Wound #1 Right Gluteal fold o Skin Prep Primary Wound Dressing Wound #1 Right Gluteal fold o Hydrafera Blue Ready Transfer Secondary Dressing Wound #1 Right Gluteal fold o Boardered Foam Dressing Dressing Change Frequency Wound #1 Right Gluteal fold o Change dressing every other day. Follow-up Appointments Wound #1 Right Gluteal fold o Return Appointment in 2 weeks. Off-Loading Wound #1 Right Gluteal fold o Turn and reposition every 2 hours [name], [name] ([id number]) Electronic Signature(s) Signed: 01/04/2018 5:06:21 PM By: Roger Shelter Signed: 01/05/2018 1:56:08 AM By: Worthy Keeler [name] Entered By: Roger Shelter on 01/04/2018 09:33:53 [name], [name] ([id number]) -------------------------------------------------------------------------------- Problem List Details Patient Name: [name] Date of Service: 01/04/2018 9:00 AM Medical Record Number: [id number] Patient Account Number: [id number] Date of Birth/Sex: October 04, 1950 (67 y.o. 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, stage 3 11/30/2017 No Yes S34.109A Unspecified injury to unspecified level of lumbar spinal cord, 11/30/2017 No Yes initial encounter [place] (primary) hypertension 11/30/2017 No Yes Inactive Problems Resolved Problems Electronic Signature(s) Signed: 01/05/2018 1:56:08 AM By: Worthy Keeler [name] Entered By: Worthy Keeler on 01/04/2018 09:20:31 [name], [name]  ([id number]) -------------------------------------------------------------------------------- Progress Note Details Patient Name: [name] Date of Service: 01/04/2018 9:00 AM Medical Record Number: [id number] Patient Account Number: [id number] Date of Birth/Sex: 1951/03/26 (67 y.o. 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 He states that there is no history of diabetes that he is aware of. He does have issues with his prostate and is currently receiving treatment for this by way of oral medication. With that being said I do not have a lot of details in that regard. Nonetheless the patient presents today as a result of having been referred to [place] by another provider initially home health was set to come out and take care of his wound although due to the fact that he apparently drives he's not able to receive home health. His wife is therefore trying to help take care of this wound within although they have been struggling with what exactly to do at this point. She states that she can do some things but she is definitely not a nurse and does have some issues with looking at blood. The good news is the wound does not appear to be too deep and is fairly superficial at this point. There is no slough noted there is some nonviable skin noted around the surface of the wound and the perimeter at this point. The central portion of the wound appears to be very good with a  dermal layer noted this does not appear to be again deep enough to extend it to subcutaneous tissue at this point. 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 In the pump he has Dilaudid and baclofen. 12/07/17 on evaluation today patient presents for follow-up concerning his ongoing lower back thigh ulcer on the right. He states that he did not get the supplies ordered and therefore has not really been able to perform the dressing changes as directed exactly. His wife was able to get some Boarder Foam Dressing's from the drugstore and subsequently has been using hydrogel which did help to a degree in the wound does appear to be able smaller. There is actually more drainage this week noted than previous. 12/21/17 on evaluation today patient appears to be doing rather well in regard to his right gluteal ulcer. He has been tolerating the dressing changes without complication. There does not appear to be any evidence of infection at this point in time. Overall the wound does seem to be making some progress as far as the edges are concerned there's not as much in the way of overlapping of the external wound edges and he has a good epithelium to wound bed border for the most part. This however is not true right at the 12 o'clock location over the span of a little over a centimeters which actually will require debridement today to clean this away and hopefully allow it to continue to heal more appropriately. 12/28/17 on evaluation today patient appears to be doing rather well in regard to his ulcer in the left gluteal region. He's been tolerating the dressing changes without complication. Apparently he has had some difficulty getting his dressing material. Apparently there's been some confusion with ordering we're gonna check into this. Nonetheless overall he's been showing signs of improvement which is  good news. Debridement is not required today. 01/04/18 on evaluation today patient presents for follow-up concerning his right gluteal ulcer. He has been tolerating the dressing changes fairly well. On inspection today it appears he may actually have some maceration them concerned about the fact that he may be developing too much moisture in  and around the wound bed which can cause delay in healing. With that being said he unfortunately really has not showed significant signs of improvement since last week's evaluation in fact this may even be just the little bit/slightly larger. Nonetheless he's been having a lot of discomfort I'm not sure this is even related to the wound as he has no pain when I'm to breeding or otherwise cleaning the wound during evaluation today. Nonetheless this is something that we did recommend he talked to his pain specialist concerning. [name], [name] ([id number]) Patient History Information obtained from Patient. Family History Hypertension - Father, Stroke - Mother, No family history of Cancer, Diabetes, Heart Disease, Kidney Disease, Lung Disease, Seizures, Thyroid Problems, Tuberculosis. Social History Never smoker, Marital Status - Married, Alcohol Use - Never, Drug Use - No History, Caffeine Use - Daily. Medical And Surgical History Notes Oncologic Prostate cancer- currently treated with horomone therapy Review of Systems (ROS) Constitutional Symptoms (General Health) Denies complaints or symptoms of Fatigue, Fever, Chills. Respiratory The patient has no complaints or symptoms. Cardiovascular The patient has no complaints or symptoms. Psychiatric The patient has no complaints or symptoms. Objective Constitutional Well-nourished and well-hydrated in no acute distress. Vitals Time Taken: 9:00 AM, Height: 73 in, Weight: 210 lbs, BMI: 27.7, Temperature: 98.5 F, Pulse: 65 bpm, Respiratory Rate: 16 breaths/min, Blood Pressure: 149/79  mmHg. Respiratory normal breathing without difficulty. Psychiatric this patient is able to make decisions and demonstrates good insight into disease process. Alert and Oriented x 3. pleasant and cooperative. General Notes: Patient's wound bed did show some maceration around the edges unfortunately. There did not appear to be any evidence of infection at this time which was good news. Overall I'm very pleased with the appearance of the wound although I did have to sharply debride away some Slough on the surface and some of the periphery tissue which was macerated and overhanging the actual edges of the wound. He tolerated this again with no discomfort. Integumentary (Hair, Skin) Wound #1 status is Open. Original cause of wound was Pressure Injury. The wound is located on the Right Gluteal fold. The wound measures 4cm length x 3.4cm width x 0.21cm depth; 10.681cm^2 area and 2.243cm^3 volume. There is Fat Layer (Subcutaneous Tissue) Exposed exposed. There is no tunneling or undermining noted. There is a medium amount of serous Kruzel, Meryl J. ([id number]) drainage noted. The wound margin is flat and intact. There is large (67-100%) pink granulation within the wound bed. There is a small (1-33%) amount of necrotic tissue within the wound bed including Adherent Slough. The periwound skin appearance exhibited: Maceration. The periwound skin appearance did not exhibit: Callus, Crepitus, Excoriation, Induration, Rash, Scarring, Dry/Scaly, Atrophie Blanche, Cyanosis, Ecchymosis, Hemosiderin Staining, Mottled, Pallor, Rubor, Erythema. Periwound temperature was noted as No Abnormality. Assessment Active Problems ICD-10 Paraplegia, unspecified Pressure ulcer of right buttock, stage 3 Unspecified injury to unspecified level of lumbar spinal cord, initial encounter Essential (primary) hypertension Procedures Wound #1 Pre-procedure diagnosis of Wound #1 is a Pressure Ulcer located on the Right  Gluteal fold . There was a Excisional Skin/Subcutaneous Tissue Debridement with a total area of 13.6 sq cm performed by [name],  E., [name]. With the following instrument(s): Curette to remove Viable and Non-Viable tissue/material. Material removed includes Subcutaneous Tissue, Slough, and Biofilm after achieving pain control using Other (lidocaine 4%). No specimens were taken. A time out was conducted at 09:29, prior to the start of the procedure. A Minimum amount of bleeding was controlled with  Pressure. The procedure was tolerated well with a pain level of 0 throughout and a pain level of 0 following the procedure. Patient s Level of Consciousness post procedure was recorded as Awake and Alert. Post Debridement Measurements: 4cm length x 3.4cm width x 0.3cm depth; 3.204cm^3 volume. Post debridement Stage noted as Category/Stage III. Character of Wound/Ulcer Post Debridement is stable. Post procedure Diagnosis Wound #1: Same as Pre-Procedure Plan Wound Cleansing: Wound #1 Right Gluteal fold: Clean wound with Normal Saline. Anesthetic (add to Medication List): Wound #1 Right Gluteal fold: Topical Lidocaine 4% cream applied to wound bed prior to debridement (In Clinic Only). Skin Barriers/Peri-Wound Care: Wound #1 Right Gluteal fold: Skin Prep Primary Wound Dressing: Wound #1 Right Gluteal fold: Hydrafera Blue Ready Transfer Secondary Dressing: [name], LOFARO ([id number]) Wound #1 Right Gluteal fold: Boardered Foam Dressing Dressing Change Frequency: Wound #1 Right Gluteal fold: Change dressing every other day. Follow-up Appointments: Wound #1 Right Gluteal fold: Return Appointment in 2 weeks. Off-Loading: Wound #1 Right Gluteal fold: Turn and reposition every 2 hours I had a very long conversation with the patient and his wife today about pressure to the area and how to reduce this. Also had an extensive conversation about shear/friction injury and how this may  actually be the bigger cause of what we're seeing at this point especially when his wife tells me that sometimes after he's sliding to move into or out of bed that the dressing becomes dislodged. Obviously this is not good. We discussed the possibility of a lift but again he's not quite sure he wants to go that route at this point. If we cannot otherwise get this heal though it may become a necessity. Otherwise we will see were things stand in two weeks time if he's not doing significantly better if anything worsens they will contact our office for a more prompt evaluation. Otherwise hopefully he will continue to show signs of good improvement. Please see above for specific wound care orders. We will see patient for re-evaluation in 1 week(s) here in the clinic. If anything worsens or changes patient will contact our office for additional recommendations. Electronic Signature(s) Signed: 01/05/2018 1:56:08 AM By: Worthy Keeler [name] Entered By: Worthy Keeler on 01/05/2018 01:34:05 ARISTIDE, WAGGLE ([id number]) -------------------------------------------------------------------------------- ROS/PFSH Details Patient Name: [name] Date of Service: 01/04/2018 9:00 AM Medical Record Number: [id number] Patient Account Number: [id number] Date of Birth/Sex: 20-May-1951 (67 y.o. M) Treating RN: Roger Shelter Primary Care Provider: [name] Other Clinician: Referring Provider: [name] Treating Provider/Extender: [name],  Weeks in Treatment: 5 Information Obtained From Patient Wound History Do you currently have one or more open woundso Yes How many open wounds do you currently haveo 1 Approximately how long have you had your woundso 1 month How have you been treating your wound(s) until nowo cream Has your wound(s) ever healed and then re-openedo No Have you had any lab work done in the past montho No Have you tested positive for an antibiotic resistant  organism (MRSA, VRE)o No Have you tested positive for osteomyelitis (bone infection)o No Have you had any tests for circulation on your legso No Constitutional Symptoms (General Health) Complaints and Symptoms: Negative for: Fatigue; Fever; Chills Eyes Medical History: Positive for: Cataracts - both removed Negative for: Glaucoma; Optic Neuritis Ear/Nose/Mouth/Throat Medical History: Negative for: Chronic sinus problems/congestion; Middle ear problems Hematologic/Lymphatic Medical History: Negative for: Anemia; Hemophilia; Human Immunodeficiency Virus; Lymphedema Respiratory Complaints and Symptoms: No Complaints or Symptoms Medical History: Negative  for: Aspiration; Asthma; Chronic Obstructive Pulmonary Disease (COPD); Pneumothorax; Sleep Apnea; Tuberculosis Cardiovascular Complaints and Symptoms: No Complaints or Symptoms Medical History: Positive for: Hypertension - takes medication HOMERO, HYSON. ([id number]) Negative for: Angina; Arrhythmia; Congestive Heart Failure; Coronary Artery Disease; Deep Vein Thrombosis; Hypotension; Myocardial Infarction; Peripheral Arterial Disease; Peripheral Venous Disease; Phlebitis; Vasculitis Gastrointestinal Medical History: Negative for: Cirrhosis ; Colitis; Crohnos; Hepatitis A; Hepatitis B; Hepatitis C Endocrine Medical History: Negative for: Type I Diabetes; Type II Diabetes Genitourinary Medical History: Negative for: End Stage Renal Disease Immunological Medical History: Negative for: Lupus Erythematosus; Raynaudos; Scleroderma Integumentary (Skin) Medical History: Negative for: History of Burn; History of pressure wounds Musculoskeletal Medical History: Negative for: Gout; Rheumatoid Arthritis; Osteoarthritis; Osteomyelitis Neurologic Medical History: Positive for: Paraplegia - waist down Negative for: Dementia; Neuropathy; Quadriplegia; Seizure Disorder Oncologic Medical History: Negative for: Received  Chemotherapy; Received Radiation Past Medical History Notes: Prostate cancer- currently treated with horomone therapy Psychiatric Complaints and Symptoms: No Complaints or Symptoms Medical History: Negative for: Anorexia/bulimia; Confinement Anxiety HBO Extended History Items Eyes: Cataracts Immunizations LARANCE, RATLEDGE ([id number]) Pneumococcal Vaccine: Received Pneumococcal Vaccination: No Implantable Devices Family and Social History Cancer: No; Diabetes: No; Heart Disease: No; Hypertension: Yes - Father; Kidney Disease: No; Lung Disease: No; Seizures: No; Stroke: Yes - Mother; Thyroid Problems: No; Tuberculosis: No; Never smoker; Marital Status - Married; Alcohol Use: Never; Drug Use: No History; Caffeine Use: Daily; Advanced Directives: Yes (Copy provided); Patient does not want information on Advanced Directives; Living Will: Yes (Copy provided) Physician Affirmation I have reviewed and agree with the above information. Electronic Signature(s) Signed: 01/05/2018 1:56:08 AM By: Worthy Keeler [name] Signed: 01/05/2018 12:50:22 PM By: Roger Shelter Entered By: Worthy Keeler on 01/05/2018 01:32:09 SABASTION, HRDLICKA ([id number]) -------------------------------------------------------------------------------- [facility] Details Patient Name: [name] Date of Service: 01/04/2018 Medical Record Number: [id number] Patient Account Number: [id number] Date of Birth/Sex: 08-11-1950 (67 y.o. 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

## 2018-01-11 ENCOUNTER — Encounter: Payer: Medicare Other | Admitting: Physician Assistant

## 2018-01-11 DIAGNOSIS — L89313 Pressure ulcer of right buttock, stage 3: Secondary | ICD-10-CM | POA: Diagnosis not present

## 2018-01-12 NOTE — Progress Notes (Signed)
Brett, [name] ([id number]) Visit Report for 01/11/2018 Arrival Information Details Patient Name: [name], [name] Date of Service: 01/11/2018 8:45 AM Medical Record Number: [id number] Patient Account Number: [id number] Date of Birth/Sex: 09/21/1950 (67 y.o. M) Treating RN: Montey Hora Primary Care Nakai Yard: [name] Other Clinician: Referring Kash Mothershead: [name] Treating Tyquisha Sharps/Extender: [name], HOYT Weeks in Treatment: 6 Visit Information History Since Last Visit Added or deleted any medications: No Patient Arrived: Wheel Chair Any new allergies or adverse reactions: No Arrival Time: 08:44 Had a fall or experienced change in Yes Accompanied By: spouse activities of daily living that may affect Transfer Assistance: None risk of falls: Patient Identification Verified: Yes Signs or symptoms of abuse/neglect since last visito No Secondary Verification Process Completed: Yes Hospitalized since last visit: No Patient Requires Transmission-Based No Implantable device outside of the clinic excluding No Precautions: cellular tissue based products placed in the center Patient Has Alerts: Yes since last visit: Patient Alerts: NOT Has Dressing in Place as Prescribed: Yes Diabetic Pain Present Now: No Electronic Signature(s) Signed: 01/11/2018 4:53:29 PM By: Montey Hora Entered By: Montey Hora on 01/11/2018 08:55:29 [name] ([id number]) -------------------------------------------------------------------------------- Clinic Level of Care Assessment Details Patient Name: [name] Date of Service: 01/11/2018 8:45 AM Medical Record Number: [id number] Patient Account Number: [id number] Date of Birth/Sex: 12/25/1950 (67 y.o. M) Treating RN: Roger Shelter Primary Care Avari Nevares: [name] Other Clinician: Referring Jearldine Cassady: [name] Treating Talma Aguillard/Extender: [name], HOYT Weeks in Treatment: 6 Clinic Level  of Care Assessment Items TOOL 4 Quantity Score X - Use when only an EandM is performed on FOLLOW-UP visit 1 0 ASSESSMENTS - Nursing Assessment / Reassessment X - Reassessment of Co-morbidities (includes updates in patient status) 1 10 X- 1 5 Reassessment of Adherence to Treatment Plan ASSESSMENTS - Wound and Skin Assessment / Reassessment X - Simple Wound Assessment / Reassessment - one wound 1 5 []  - 0 Complex Wound Assessment / Reassessment - multiple wounds []  - 0 Dermatologic / Skin Assessment (not related to wound area) ASSESSMENTS - Focused Assessment []  - Circumferential Edema Measurements - multi extremities 0 []  - 0 Nutritional Assessment / Counseling / Intervention []  - 0 Lower Extremity Assessment (monofilament, tuning fork, pulses) []  - 0 Peripheral Arterial Disease Assessment (using hand held doppler) ASSESSMENTS - Ostomy and/or Continence Assessment and Care []  - Incontinence Assessment and Management 0 []  - 0 Ostomy Care Assessment and Management (repouching, etc.) PROCESS - Coordination of Care X - Simple Patient / Family Education for ongoing care 1 15 []  - 0 Complex (extensive) Patient / Family Education for ongoing care []  - 0 Staff obtains Programmer, systems, Records, Test Results / Process Orders []  - 0 Staff telephones HHA, Nursing Homes / Clarify orders / etc []  - 0 Routine Transfer to another Facility (non-emergent condition) []  - 0 Routine Hospital Admission (non-emergent condition) []  - 0 New Admissions / Biomedical engineer / Ordering NPWT, Apligraf, etc. []  - 0 Emergency Hospital Admission (emergent condition) X- 1 10 Simple Discharge Coordination LOYED, WILMES. ([id number]) []  - 0 Complex (extensive) Discharge Coordination PROCESS - Special Needs []  - Pediatric / Minor Patient Management 0 []  - 0 Isolation Patient Management []  - 0 Hearing / Language / Visual special needs []  - 0 Assessment of Community assistance (transportation, D/C  planning, etc.) []  - 0 Additional assistance / Altered mentation []  - 0 Support Surface(s) Assessment (bed, cushion, seat, etc.) INTERVENTIONS - Wound Cleansing / Measurement X - Simple Wound Cleansing - one wound 1  5 []  - 0 Complex Wound Cleansing - multiple wounds X- 1 5 Wound Imaging (photographs - any number of wounds) []  - 0 Wound Tracing (instead of photographs) X- 1 5 Simple Wound Measurement - one wound []  - 0 Complex Wound Measurement - multiple wounds INTERVENTIONS - Wound Dressings X - Small Wound Dressing one or multiple wounds 1 10 []  - 0 Medium Wound Dressing one or multiple wounds []  - 0 Large Wound Dressing one or multiple wounds []  - 0 Application of Medications - topical []  - 0 Application of Medications - injection INTERVENTIONS - Miscellaneous []  - External ear exam 0 []  - 0 Specimen Collection (cultures, biopsies, blood, body fluids, etc.) []  - 0 Specimen(s) / Culture(s) sent or taken to Lab for analysis []  - 0 Patient Transfer (multiple staff / Civil Service fast streamer / Similar devices) []  - 0 Simple Staple / Suture removal (25 or less) []  - 0 Complex Staple / Suture removal (26 or more) []  - 0 Hypo / Hyperglycemic Management (close monitor of Blood Glucose) []  - 0 Ankle / Brachial Index (ABI) - do not check if billed separately X- 1 5 Vital Signs Dancer, Jarriel J. ([id number]) Has the patient been seen at the hospital within the last three years: Yes Total Score: 75 Level Of Care: New/Established - Level 2 Electronic Signature(s) Signed: 01/11/2018 4:53:36 PM By: Roger Shelter Entered By: Roger Shelter on 01/11/2018 09:23:32 [name] ([id number]) -------------------------------------------------------------------------------- Lower Extremity Assessment Details Patient Name: [name] Date of Service: 01/11/2018 8:45 AM Medical Record Number: [id number] Patient Account Number: [id number] Date of Birth/Sex: Dec 20, 1950 (67 y.o.  M) Treating RN: Montey Hora Primary Care Ketsia Linebaugh: [name] Other Clinician: Referring Abbygail Willhoite: [name] Treating Arman Loy/Extender: [name], HOYT Weeks in Treatment: 6 Electronic Signature(s) Signed: 01/11/2018 4:53:29 PM By: Montey Hora Entered By: Montey Hora on 01/11/2018 08:56:26 Privott, [name] ([id number]) -------------------------------------------------------------------------------- Multi Wound Chart Details Patient Name: [name] Date of Service: 01/11/2018 8:45 AM Medical Record Number: [id number] Patient Account Number: [id number] Date of Birth/Sex: 11-08-1950 (67 y.o. M) Treating RN: Roger Shelter Primary Care Bree Heinzelman: [name] Other Clinician: Referring Avaleen Brownley: [name] Treating Jeffifer Rabold/Extender: [name], HOYT Weeks in Treatment: 6 Vital Signs Height(in): 73 Pulse(bpm): 89 Weight(lbs): 210 Blood Pressure(mmHg): 135/80 Body Mass Index(BMI): 28 Temperature(F): 98.7 Respiratory Rate 16 (breaths/min): Photos: [N/A:N/A] Wound Location: Right Gluteal fold N/A N/A Wounding Event: Pressure Injury N/A N/A Primary Etiology: Pressure Ulcer N/A N/A Comorbid History: Cataracts, Hypertension, N/A N/A Paraplegia Date Acquired: 11/02/2017 N/A N/A Weeks of Treatment: 6 N/A N/A Wound Status: Open N/A N/A Measurements L x W x D 4.3x2.5x0.2 N/A N/A (cm) Area (cm) : 8.443 N/A N/A Volume (cm) : 1.689 N/A N/A % Reduction in Area: 16.00% N/A N/A % Reduction in Volume: -68.10% N/A N/A Classification: Category/Stage III N/A N/A Exudate Amount: Medium N/A N/A Exudate Type: Serous N/A N/A Exudate Color: amber N/A N/A Wound Margin: Flat and Intact N/A N/A Granulation Amount: Medium (34-66%) N/A N/A Granulation Quality: Pink N/A N/A Necrotic Amount: Medium (34-66%) N/A N/A Exposed Structures: Fat Layer (Subcutaneous N/A N/A Tissue) Exposed: Yes Fascia: No Tendon: No Muscle: No Joint: No Bone:  No Veillon, Jaeceon J. ([id number]) Epithelialization: Small (1-33%) N/A N/A Periwound Skin Texture: Excoriation: No N/A N/A Induration: No Callus: No Crepitus: No Rash: No Scarring: No Periwound Skin Moisture: Maceration: Yes N/A N/A Dry/Scaly: No Periwound Skin Color: Atrophie Blanche: No N/A N/A Cyanosis: No Ecchymosis: No Erythema: No Hemosiderin Staining: No Mottled: No Pallor: No Rubor:  No Temperature: No Abnormality N/A N/A Tenderness on Palpation: No N/A N/A Wound Preparation: Ulcer Cleansing: N/A N/A Rinsed/Irrigated with Saline Topical Anesthetic Applied: Other: lidocaine 4% Treatment Notes Electronic Signature(s) Signed: 01/11/2018 4:53:36 PM By: Roger Shelter Entered By: Roger Shelter on 01/11/2018 09:19:48 KOREE, SCHOPF ([id number]) -------------------------------------------------------------------------------- [place] Details Patient Name: [name] Date of Service: 01/11/2018 8:45 AM Medical Record Number: [id number] Patient Account Number: [id number] Date of Birth/Sex: 1951-07-18 (67 y.o. M) Treating RN: Roger Shelter Primary Care Chetan Mehring: [name] Other Clinician: Referring Merion Caton: [name] Treating Jalayah Gutridge/Extender: [name], HOYT Weeks in Treatment: 6 Active Inactive ` Orientation to the Wound Care Program Nursing Diagnoses: Knowledge deficit related to the wound healing center program Goals: Patient/caregiver will verbalize understanding of the [facility] Date Initiated: 11/30/2017 Target Resolution Date: 12/21/2017 Goal Status: Active Interventions: Provide education on orientation to the wound center Notes: ` Wound/Skin Impairment Nursing Diagnoses: Impaired tissue integrity Goals: Patient/caregiver will verbalize understanding of skin care regimen Date Initiated: 11/30/2017 Target Resolution Date: 12/21/2017 Goal Status: Active Ulcer/skin breakdown  will have a volume reduction of 30% by week 4 Date Initiated: 11/30/2017 Target Resolution Date: 12/21/2017 Goal Status: Active Interventions: Assess patient/caregiver ability to obtain necessary supplies Assess patient/caregiver ability to perform ulcer/skin care regimen upon admission and as needed Assess ulceration(s) every visit Treatment Activities: Skin care regimen initiated : 11/30/2017 Notes: Electronic Signature(s) Signed: 01/11/2018 4:53:36 PM By: Patrick North, [name] ([id number]) Entered By: Roger Shelter on 01/11/2018 09:19:39 AMEN, STASZAK ([id number]) -------------------------------------------------------------------------------- Pain Assessment Details Patient Name: [name] Date of Service: 01/11/2018 8:45 AM Medical Record Number: [id number] Patient Account Number: [id number] Date of Birth/Sex: [birth year] (67 y.o. M) Treating RN: Montey Hora Primary Care Jessey Stehlin: [name] Other Clinician: Referring Cortez Steelman: [name] Treating Shyheim Tanney/Extender: [name], HOYT Weeks in Treatment: 6 Active Problems Location of Pain Severity and Description of Pain Patient Has Paino Yes Site Locations Pain Location: Generalized Pain, Pain in Ulcers With Dressing Change: Yes Duration of the Pain. Constant / Intermittento Intermittent Pain Management and Medication Current Pain Management: Electronic Signature(s) Signed: 01/11/2018 4:53:29 PM By: Montey Hora Entered By: Montey Hora on 01/11/2018 08:55:19 SEIYA, SILSBY ([id number]) -------------------------------------------------------------------------------- Wound Assessment Details Patient Name: [name] Date of Service: 01/11/2018 8:45 AM Medical Record Number: [id number] Patient Account Number: [id number] Date of Birth/Sex: 10-24-1950 (67 y.o. M) Treating RN: Montey Hora Primary Care Raji Glinski: [name] Other Clinician: Referring  Calel Pisarski: [name] Treating Jabar Krysiak/Extender: [name], HOYT Weeks in Treatment: 6 Wound Status Wound Number: 1 Primary Etiology: Pressure Ulcer Wound Location: Right Gluteal fold Wound Status: Open Wounding Event: Pressure Injury Comorbid History: Cataracts, Hypertension, Paraplegia Date Acquired: 11/02/2017 Weeks Of Treatment: 6 Clustered Wound: No Photos Photo Uploaded By: Montey Hora on 01/11/2018 09:04:29 Wound Measurements Length: (cm) 4.3 Width: (cm) 2.5 Depth: (cm) 0.2 Area: (cm) 8.443 Volume: (cm) 1.689 % Reduction in Area: 16% % Reduction in Volume: -68.1% Epithelialization: Small (1-33%) Tunneling: No Undermining: No Wound Description Classification: Category/Stage III Wound Margin: Flat and Intact Exudate Amount: Medium Exudate Type: Serous Exudate Color: amber Foul Odor After Cleansing: No Slough/Fibrino Yes Wound Bed Granulation Amount: Medium (34-66%) Exposed Structure Granulation Quality: Pink Fascia Exposed: No Necrotic Amount: Medium (34-66%) Fat Layer (Subcutaneous Tissue) Exposed: Yes Necrotic Quality: Adherent Slough Tendon Exposed: No Muscle Exposed: No Joint Exposed: No Bone Exposed: No Periwound Skin Texture Vanorman, Wayden J. ([id number]) Texture Color No Abnormalities Noted: No No Abnormalities Noted: No Callus: No Atrophie Blanche:  No Crepitus: No Cyanosis: No Excoriation: No Ecchymosis: No Induration: No Erythema: No Rash: No Hemosiderin Staining: No Scarring: No Mottled: No Pallor: No Moisture Rubor: No No Abnormalities Noted: No Dry / Scaly: No Temperature / Pain Maceration: Yes Temperature: No Abnormality Wound Preparation Ulcer Cleansing: Rinsed/Irrigated with Saline Topical Anesthetic Applied: Other: lidocaine 4%, Treatment Notes Wound #1 (Right Gluteal fold) 1. Cleansed with: Clean wound with Normal Saline 2. Anesthetic Topical Lidocaine 4% cream to wound bed prior to debridement 4.  Dressing Applied: Hydrafera Blue 5. Secondary Dressing Applied Bordered Foam Dressing Electronic Signature(s) Signed: 01/11/2018 4:53:29 PM By: Montey Hora Entered By: Montey Hora on 01/11/2018 08:56:17 NAI, DASCH ([id number]) -------------------------------------------------------------------------------- [place] Details Patient Name: [name] Date of Service: 01/11/2018 8:45 AM Medical Record Number: [id number] Patient Account Number: [id number] Date of Birth/Sex: 1950-10-04 (67 y.o. M) Treating RN: Montey Hora Primary Care Perri Aragones: [name] Other Clinician: Referring Melainie Krinsky: [name] Treating Stepan Verrette/Extender: [name], HOYT Weeks in Treatment: 6 Vital Signs Time Taken: 08:50 Temperature (F): 98.7 Height (in): 73 Pulse (bpm): 89 Weight (lbs): 210 Respiratory Rate (breaths/min): 16 Body Mass Index (BMI): 27.7 Blood Pressure (mmHg): 135/80 Reference Range: 80 - 120 mg / dl Electronic Signature(s) Signed: 01/11/2018 4:53:29 PM By: Montey Hora Entered By: Montey Hora on 01/11/2018 08:55:53

## 2018-01-12 NOTE — Progress Notes (Signed)
NIKOLIS, BERENT ([id number]) Visit Report for 01/11/2018 Chief Complaint Document Details Patient Name: [name], [name] Date of Service: 01/11/2018 8:45 AM Medical Record Number: [id number] Patient Account Number: [id number] Date of Birth/Sex: [birth year] (67 y.o. M) Treating RN: Roger Shelter Primary Care Provider: [name] Other Clinician: Referring Provider: [name] Treating Provider/Extender: [name], HOYT Weeks in Treatment: 6 Information Obtained from: Patient Chief Complaint Right gluteal fold ulcer Electronic Signature(s) Signed: 01/11/2018 11:25:57 PM By: Worthy Keeler [name] Entered By: Worthy Keeler on 01/11/2018 09:05:08 [name], [name] ([id number]) -------------------------------------------------------------------------------- HPI Details Patient Name: [name] Date of Service: 01/11/2018 8:45 AM Medical Record Number: [id number] Patient Account Number: [id number] Date of Birth/Sex: [birth year] (67 y.o. 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 He states that there is no history of diabetes that he is aware of. He does have issues with his prostate and is currently receiving treatment for this by way of oral medication. With that being said I do not have a lot of details in that regard. Nonetheless the patient presents today as a result of having been referred to [place] by another provider initially home health was set to come out and take care of his wound although due to the fact  that he apparently drives he's not able to receive home health. His wife is therefore trying to help take care of this wound within although they have been struggling with what exactly to do at this point. She states that she can do some things but she is definitely not a nurse and does have some issues with looking at blood. The good news is the wound does not appear to be too deep and is fairly superficial at this point. There is no slough noted there is some nonviable skin noted around the surface of the wound and the perimeter at this point. The central portion of the wound appears to be very good with a dermal layer noted this does not appear to be again deep enough to extend it to subcutaneous tissue at this point. 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 In the pump he has Dilaudid and baclofen. 12/07/17 on evaluation today patient presents for follow-up concerning his ongoing lower back thigh ulcer on the right. He states that he did not get the supplies ordered and therefore has not really been able to perform the dressing changes as directed exactly. His wife was able to get some Boarder Foam Dressing's from the drugstore and subsequently has been using hydrogel which did help to a degree in the wound does appear to be able smaller. There is actually more drainage this week noted than previous. 12/21/17 on evaluation today patient appears to be doing rather well in regard to his right gluteal ulcer. He has been tolerating the dressing changes without complication. There does not appear to be any evidence of infection at this point in time. Overall the wound does seem to be making some progress as far as the edges  are concerned there's not as much in the way of overlapping of the external wound edges and he has a good epithelium to wound bed border for the most part. This however is not  true right at the 12 o'clock location over the span of a little over a centimeters which actually will require debridement today to clean this away and hopefully allow it to continue to heal more appropriately. 12/28/17 on evaluation today patient appears to be doing rather well in regard to his ulcer in the left gluteal region. He's been tolerating the dressing changes without complication. Apparently he has had some difficulty getting his dressing material. Apparently there's been some confusion with ordering we're gonna check into this. Nonetheless overall he's been showing signs of improvement which is good news. Debridement is not required today. 01/04/18 on evaluation today patient presents for follow-up concerning his right gluteal ulcer. He has been tolerating the dressing changes fairly well. On inspection today it appears he may actually have some maceration them concerned about the fact that he may be developing too much moisture in and around the wound bed which can cause delay in healing. With that being said he unfortunately really has not showed significant signs of improvement since last week's evaluation in fact this may even be just the little bit/slightly larger. Nonetheless he's been having a lot of discomfort I'm not sure this is even related to the wound as he has no pain when I'm to breeding or otherwise cleaning the wound during evaluation today. Nonetheless this is something that we did recommend he talked to his pain specialist concerning. 01/11/18 on evaluation today patient appears to be doing better in regard to his ulceration. He has been tolerating the dressing changes without complication. With that being said overall there's no evidence of infection which is good news. The only thing is he did receive the hatch affair blue classic versus the ready nonetheless I feel like this is perfectly fine and appears to have done well for him over the past week. [name], [name] ([id number]) Electronic Signature(s) Signed: 01/11/2018 11:25:57 PM By: Worthy Keeler [name] Entered By: Worthy Keeler on 01/11/2018 09:26:15 [name], [name] ([id number]) -------------------------------------------------------------------------------- Physical Exam Details Patient Name: [name] Date of Service: 01/11/2018 8:45 AM Medical Record Number: [id number] Patient Account Number: [id number] Date of Birth/Sex: [birth year] (67 y.o. M) Treating RN: Roger Shelter Primary Care Provider: [name] Other Clinician: Referring Provider: [name] Treating Provider/Extender: [name], HOYT Weeks in Treatment: 6 Constitutional Well-nourished and well-hydrated in no acute distress. Respiratory normal breathing without difficulty. clear to auscultation bilaterally. Cardiovascular regular rate and rhythm with normal S1, S2. Psychiatric this patient is able to make decisions and demonstrates good insight into disease process. Alert and Oriented x 3. pleasant and cooperative. Notes On inspection today patient's wound edges all appear to be attached to the wound bed which is actually the first time I've seen this all the way around. Obscene this is excellent news and I'm very happy in that regard I'm also starting to see more of a red tint to the granular surface versus the slough and more pale/pink color that we been seeing. I think this is also a good sign. I'm recommending that we likely continue with the Current wound care measures. Electronic Signature(s) Signed: 01/11/2018 11:25:57 PM By: Worthy Keeler [name] Entered By: Worthy Keeler on 01/11/2018 09:27:12 [name], [name] ([id number]) -------------------------------------------------------------------------------- Physician Orders Details Patient Name: [name] Date of  Service: 01/11/2018 8:45 AM Medical Record Number: [id number] Patient Account Number: [id number] Date of  Birth/Sex: [birth year] (67 y.o. M) Treating RN: Roger Shelter Primary Care Provider: [name] Other Clinician: Referring Provider: [name] Treating Provider/Extender: [name], HOYT Weeks in Treatment: 6 Verbal / Phone Orders: No Diagnosis Coding Wound Cleansing Wound #1 Right Gluteal fold o Clean wound with Normal Saline. Anesthetic (add to Medication List) Wound #1 Right Gluteal fold o Topical Lidocaine 4% cream applied to wound bed prior to debridement (In Clinic Only). Skin Barriers/Peri-Wound Care Wound #1 Right Gluteal fold o Skin Prep Primary Wound Dressing Wound #1 Right Gluteal fold o Hydrafera Blue Ready Transfer Secondary Dressing Wound #1 Right Gluteal fold o Boardered Foam Dressing Dressing Change Frequency Wound #1 Right Gluteal fold o Change dressing every other day. Follow-up Appointments Wound #1 Right Gluteal fold o Return Appointment in 2 weeks. Off-Loading Wound #1 Right Gluteal fold o Turn and reposition every 2 hours Electronic Signature(s) Signed: 01/11/2018 4:53:36 PM By: Roger Shelter Signed: 01/11/2018 11:25:57 PM By: Worthy Keeler [name] Entered By: Roger Shelter on 01/11/2018 09:22:15 LABRANDON, KNOCH ([id number]) -------------------------------------------------------------------------------- Problem List Details Patient Name: [name] Date of Service: 01/11/2018 8:45 AM Medical Record Number: [id number] Patient Account Number: [id number] Date of Birth/Sex: 1950/09/30 (67 y.o. 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, stage 3 11/30/2017 No Yes S34.109A  Unspecified injury to unspecified level of lumbar spinal cord, 11/30/2017 No Yes initial encounter [place] (primary) hypertension 11/30/2017 No Yes Inactive Problems Resolved Problems Electronic Signature(s) Signed: 01/11/2018 11:25:57 PM By: Worthy Keeler [name] Entered By: Worthy Keeler on 01/11/2018 09:05:01 [name] ([id number]) -------------------------------------------------------------------------------- Progress Note Details Patient Name: [name] Date of Service: 01/11/2018 8:45 AM Medical Record Number: [id number] Patient Account Number: [id number] Date of Birth/Sex: 1950-12-06 (67 y.o. 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 He states that there is no history of diabetes that he is aware of. He does have issues with his prostate and is currently receiving treatment for this by way of oral medication. With that being said I do not have a lot of details in that regard. Nonetheless the patient presents today as a result of having been referred to [place] by another provider initially home health was set to come out and take care of his wound although due to the fact that he apparently drives he's not able to receive home health. His wife is therefore trying to help take care of this wound within although they have been struggling with what exactly to do at this point. She states that she can do some things but she is definitely not a  nurse and does have some issues with looking at blood. The good news is the wound does not appear to be too deep and  is fairly superficial at this point. There is no slough noted there is some nonviable skin noted around the surface of the wound and the perimeter at this point. The central portion of the wound appears to be very good with a dermal layer noted this does not appear to be again deep enough to extend it to subcutaneous tissue at this point. 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 In the pump he has Dilaudid and baclofen. 12/07/17 on evaluation today patient presents for follow-up concerning his ongoing lower back thigh ulcer on the right. He states that he did not get the supplies ordered and therefore has not really been able to perform the dressing changes as directed exactly. His wife was able to get some Boarder Foam Dressing's from the drugstore and subsequently has been using hydrogel which did help to a degree in the wound does appear to be able smaller. There is actually more drainage this week noted than previous. 12/21/17 on evaluation today patient appears to be doing rather well in regard to his right gluteal ulcer. He has been tolerating the dressing changes without complication. There does not appear to be any evidence of infection at this point in time. Overall the wound does seem to be making some progress as far as the edges are concerned there's not as much in the way of overlapping of the external wound edges and he has a good epithelium to wound bed border for the most part. This however is not true right at the 12 o'clock location over the span of a little over a centimeters which actually will require debridement today to clean this away and hopefully allow it to continue to heal more appropriately. 12/28/17 on evaluation today patient appears to be doing rather  well in regard to his ulcer in the left gluteal region. He's been tolerating the dressing changes without complication. Apparently he has had some difficulty getting his dressing material. Apparently there's been some confusion with ordering we're gonna check into this. Nonetheless overall he's been showing signs of improvement which is good news. Debridement is not required today. 01/04/18 on evaluation today patient presents for follow-up concerning his right gluteal ulcer. He has been tolerating the dressing changes fairly well. On inspection today it appears he may actually have some maceration them concerned about the fact that he may be developing too much moisture in and around the wound bed which can cause delay in healing. With that being said he unfortunately really has not showed significant signs of improvement since last week's evaluation in fact this may even be just the little bit/slightly larger. Nonetheless he's been having a lot of discomfort I'm not sure this is even related to the wound as he has no pain when I'm to breeding or otherwise cleaning the wound during evaluation today. Nonetheless this is something that we did recommend he talked to his pain specialist concerning. 01/11/18 on evaluation today patient appears to be doing better in regard to his ulceration. He has been tolerating the dressing [name], [name] ([id number]) changes without complication. With that being said overall there's no evidence of infection which is good news. The only thing is he did receive the hatch affair blue classic versus the ready nonetheless I feel like this is perfectly fine and appears to have done well for him over the past week. Patient History Information obtained from Patient.  Family History Hypertension - Father, Stroke - Mother, No family history of Cancer, Diabetes, Heart Disease, Kidney Disease, Lung Disease, Seizures, Thyroid Problems, Tuberculosis. Social History Never  smoker, Marital Status - Married, Alcohol Use - Never, Drug Use - No History, Caffeine Use - Daily. Medical And Surgical History Notes Oncologic Prostate cancer- currently treated with horomone therapy Review of Systems (ROS) Constitutional Symptoms (General Health) Denies complaints or symptoms of Fever, Chills. Respiratory The patient has no complaints or symptoms. Cardiovascular The patient has no complaints or symptoms. Psychiatric The patient has no complaints or symptoms. Objective Constitutional Well-nourished and well-hydrated in no acute distress. Vitals Time Taken: 8:50 AM, Height: 73 in, Weight: 210 lbs, BMI: 27.7, Temperature: 98.7 F, Pulse: 89 bpm, Respiratory Rate: 16 breaths/min, Blood Pressure: 135/80 mmHg. Respiratory normal breathing without difficulty. clear to auscultation bilaterally. Cardiovascular regular rate and rhythm with normal S1, S2. Psychiatric this patient is able to make decisions and demonstrates good insight into disease process. Alert and Oriented x 3. pleasant and cooperative. General Notes: On inspection today patient's wound edges all appear to be attached to the wound bed which is actually the [name], [name]. ([id number]) first time I've seen this all the way around. Obscene this is excellent news and I'm very happy in that regard I'm also starting to see more of a red tint to the granular surface versus the slough and more pale/pink color that we been seeing. I think this is also a good sign. I'm recommending that we likely continue with the Current wound care measures. Integumentary (Hair, Skin) Wound #1 status is Open. Original cause of wound was Pressure Injury. The wound is located on the Right Gluteal fold. The wound measures 4.3cm length x 2.5cm width x 0.2cm depth; 8.443cm^2 area and 1.689cm^3 volume. There is Fat Layer (Subcutaneous Tissue) Exposed exposed. There is no tunneling or undermining noted. There is a medium amount of  serous drainage noted. The wound margin is flat and intact. There is medium (34-66%) pink granulation within the wound bed. There is a medium (34-66%) amount of necrotic tissue within the wound bed including Adherent Slough. The periwound skin appearance exhibited: Maceration. The periwound skin appearance did not exhibit: Callus, Crepitus, Excoriation, Induration, Rash, Scarring, Dry/Scaly, Atrophie Blanche, Cyanosis, Ecchymosis, Hemosiderin Staining, Mottled, Pallor, Rubor, Erythema. Periwound temperature was noted as No Abnormality. Assessment Active Problems ICD-10 Paraplegia, unspecified Pressure ulcer of right buttock, stage 3 Unspecified injury to unspecified level of lumbar spinal cord, initial encounter [place] (primary) hypertension Plan Wound Cleansing: Wound #1 Right Gluteal fold: Clean wound with Normal Saline. Anesthetic (add to Medication List): Wound #1 Right Gluteal fold: Topical Lidocaine 4% cream applied to wound bed prior to debridement (In Clinic Only). Skin Barriers/Peri-Wound Care: Wound #1 Right Gluteal fold: Skin Prep Primary Wound Dressing: Wound #1 Right Gluteal fold: Hydrafera Blue Ready Transfer Secondary Dressing: Wound #1 Right Gluteal fold: Boardered Foam Dressing Dressing Change Frequency: Wound #1 Right Gluteal fold: Change dressing every other day. Follow-up Appointments: Wound #1 Right Gluteal fold: Return Appointment in 2 weeks. Off-Loading: Wound #1 Right Gluteal fold: Turn and reposition every 2 hours [name], [name]. ([id number]) I am going to recommend that we continue with the Current wound care measures for the next week. We will subsequently see were things stand at follow-up. If anything changes he'll let us know. Please see above for specific wound care orders. We will see patient for re-evaluation in 1 week(s) here in the clinic. If anything worsens or changes patient will  contact our office for additional  recommendations. Electronic Signature(s) Signed: 01/11/2018 11:25:57 PM By: Worthy Keeler [name] Entered By: Worthy Keeler on 01/11/2018 09:27:52 [name], [name] ([id number]) -------------------------------------------------------------------------------- ROS/PFSH Details Patient Name: [name] Date of Service: 01/11/2018 8:45 AM Medical Record Number: [id number] Patient Account Number: [id number] Date of Birth/Sex: Apr 04, 1951 (67 y.o. M) Treating RN: Roger Shelter Primary Care Provider: [name] Other Clinician: Referring Provider: [name] Treating Provider/Extender: [name], HOYT Weeks in Treatment: 6 Information Obtained From Patient Wound History Do you currently have one or more open woundso Yes How many open wounds do you currently haveo 1 Approximately how long have you had your woundso 1 month How have you been treating your wound(s) until nowo cream Has your wound(s) ever healed and then re-openedo No Have you had any lab work done in the past montho No Have you tested positive for an antibiotic resistant organism (MRSA, VRE)o No Have you tested positive for osteomyelitis (bone infection)o No Have you had any tests for circulation on your legso No Constitutional Symptoms (General Health) Complaints and Symptoms: Negative for: Fever; Chills Eyes Medical History: Positive for: Cataracts - both removed Negative for: Glaucoma; Optic Neuritis Ear/Nose/Mouth/Throat Medical History: Negative for: Chronic sinus problems/congestion; Middle ear problems Hematologic/Lymphatic Medical History: Negative for: Anemia; Hemophilia; Human Immunodeficiency Virus; Lymphedema Respiratory Complaints and Symptoms: No Complaints or Symptoms Medical History: Negative for: Aspiration; Asthma; Chronic Obstructive Pulmonary Disease (COPD); Pneumothorax; Sleep Apnea; Tuberculosis Cardiovascular Complaints and Symptoms: No Complaints or  Symptoms Medical History: Positive for: Hypertension - takes medication [name], GLIDDEN. ([id number]) Negative for: Angina; Arrhythmia; Congestive Heart Failure; Coronary Artery Disease; Deep Vein Thrombosis; Hypotension; Myocardial Infarction; Peripheral Arterial Disease; Peripheral Venous Disease; Phlebitis; Vasculitis Gastrointestinal Medical History: Negative for: Cirrhosis ; Colitis; Crohnos; Hepatitis A; Hepatitis B; Hepatitis C Endocrine Medical History: Negative for: Type I Diabetes; Type II Diabetes Genitourinary Medical History: Negative for: End Stage Renal Disease Immunological Medical History: Negative for: Lupus Erythematosus; Raynaudos; Scleroderma Integumentary (Skin) Medical History: Negative for: History of Burn; History of pressure wounds Musculoskeletal Medical History: Negative for: Gout; Rheumatoid Arthritis; Osteoarthritis; Osteomyelitis Neurologic Medical History: Positive for: Paraplegia - waist down Negative for: Dementia; Neuropathy; Quadriplegia; Seizure Disorder Oncologic Medical History: Negative for: Received Chemotherapy; Received Radiation Past Medical History Notes: Prostate cancer- currently treated with horomone therapy Psychiatric Complaints and Symptoms: No Complaints or Symptoms Medical History: Negative for: Anorexia/bulimia; Confinement Anxiety HBO Extended History Items Eyes: Cataracts Immunizations [name], [name] ([id number]) Pneumococcal Vaccine: Received Pneumococcal Vaccination: No Implantable Devices Family and Social History Cancer: No; Diabetes: No; Heart Disease: No; Hypertension: Yes - Father; Kidney Disease: No; Lung Disease: No; Seizures: No; Stroke: Yes - Mother; Thyroid Problems: No; Tuberculosis: No; Never smoker; Marital Status - Married; Alcohol Use: Never; Drug Use: No History; Caffeine Use: Daily; Advanced Directives: Yes (Copy provided); Patient does not want information on Advanced Directives;  Living Will: Yes (Copy provided) Physician Affirmation I have reviewed and agree with the above information. Electronic Signature(s) Signed: 01/11/2018 4:53:36 PM By: Roger Shelter Signed: 01/11/2018 11:25:57 PM By: Worthy Keeler [name] Entered By: Worthy Keeler on 01/11/2018 09:26:32 [name], [name] ([id number]) -------------------------------------------------------------------------------- SuperBill Details Patient Name: [name] Date of Service: 01/11/2018 Medical Record Number: [id number] Patient Account Number: [id number] Date of Birth/Sex: May 15, 1951 (67 y.o. 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, stage 3 S34.109A Unspecified injury to unspecified level of lumbar spinal cord, initial encounter I10 Essential (primary) hypertension Facility Procedures CPT4 Code: [id number] Description: [phone] - WOUND CARE VISIT-LEV 2 EST PT Modifier: Quantity: 1 Physician Procedures CPT4 Code Description: [id number] - WC PHYS LEVEL 1 - NEW PT ICD-10 Diagnosis Description G82.20 Paraplegia, unspecified 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

## 2018-01-25 ENCOUNTER — Encounter: Payer: Medicare Other | Attending: Physician Assistant | Admitting: Physician Assistant

## 2018-01-25 DIAGNOSIS — L89313 Pressure ulcer of right buttock, stage 3: Secondary | ICD-10-CM | POA: Insufficient documentation

## 2018-01-25 DIAGNOSIS — X58XXXS Exposure to other specified factors, sequela: Secondary | ICD-10-CM | POA: Diagnosis not present

## 2018-01-25 DIAGNOSIS — I10 Essential (primary) hypertension: Secondary | ICD-10-CM | POA: Diagnosis not present

## 2018-01-25 DIAGNOSIS — T148XXS Other injury of unspecified body region, sequela: Secondary | ICD-10-CM | POA: Diagnosis not present

## 2018-01-25 DIAGNOSIS — G8220 Paraplegia, unspecified: Secondary | ICD-10-CM | POA: Diagnosis not present

## 2018-01-26 NOTE — Progress Notes (Signed)
MYKAEL, [name] ([id number]) Visit Report for 01/25/2018 Chief Complaint Document Details Patient Name: [name], [name] Date of Service: 01/25/2018 8:30 AM Medical Record Number: [id number] Patient Account Number: [id number] Date of Birth/Sex: 1951-03-10 (67 y.o. M) Treating RN: Roger Shelter Primary Care Provider: [name] Other Clinician: Referring Provider: [name] Treating Provider/Extender: [name], HOYT Weeks in Treatment: 8 Information Obtained from: Patient Chief Complaint Right gluteal fold ulcer Electronic Signature(s) Signed: 01/25/2018 8:46:34 PM By: Worthy Keeler [name] Entered By: Worthy Keeler on 01/25/2018 08:40:57 ERICE, AHLES ([id number]) -------------------------------------------------------------------------------- Debridement Details Patient Name: [name] Date of Service: 01/25/2018 8:30 AM Medical Record Number: [id number] Patient Account Number: [id number] Date of Birth/Sex: July 18, 1951 (67 y.o. M) Treating RN: Roger Shelter Primary Care Provider: [name] Other Clinician: Referring Provider: [name] Treating Provider/Extender: [name], HOYT Weeks in Treatment: 8 Debridement Performed for Wound #1 Right Gluteal fold Assessment: Performed By: Physician [name], HOYT E., [name] Debridement Type: Debridement Pre-procedure Verification/Time Yes - 08:46 Out Taken: Start Time: 08:46 Pain Control: Other : lidocaine 4% Total Area Debrided (L x W): 4.3 (cm) x 2.9 (cm) = 12.47 (cm) Tissue and other material Viable, Non-Viable, Slough, Subcutaneous, Biofilm, Slough debrided: Level: Skin/Subcutaneous Tissue Debridement Description: Excisional Instrument: Curette Bleeding: Moderate Hemostasis Achieved: Pressure End Time: 08:47 Procedural Pain: 0 Post Procedural Pain: 0 Response to Treatment: Procedure was tolerated well Level of Consciousness: Awake and Alert Post Procedure  Vitals: Temperature: 98.7 Pulse: 96 Respiratory Rate: 18 Blood Pressure: Systolic Blood Pressure: 716 Diastolic Blood Pressure: 93 Post Debridement Measurements of Total Wound Length: (cm) 4.3 Stage: Category/Stage III Width: (cm) 2.9 Depth: (cm) 0.2 Volume: (cm) 1.959 Character of Wound/Ulcer Post Stable Debridement: Post Procedure Diagnosis Same as Pre-procedure Electronic Signature(s) Signed: 01/25/2018 8:46:34 PM By: Worthy Keeler [name] Signed: 01/26/2018 11:46:23 AM By: Roger Shelter Entered By: Roger Shelter on 01/25/2018 08:48:51 AVIER, JECH ([id number]) JUANA, MONTINI ([id number]) -------------------------------------------------------------------------------- HPI Details Patient Name: [name] Date of Service: 01/25/2018 8:30 AM Medical Record Number: [id number] Patient Account Number: [id number] Date of Birth/Sex: 01/27/1951 (67 y.o. 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 He states that there is no history of diabetes that he is aware of. He does have issues with his prostate and is currently receiving treatment for this by way of oral medication. With that being said I do not have a lot of details in that regard. Nonetheless the patient presents today as a result of having been referred to [place] by another provider initially home health was set to come out and take care of his wound although due to the fact that he apparently drives he's not able to receive home health. His wife is therefore  trying to help take care of this wound within although they have been struggling with what exactly to do at this point. She states that she can do some things but she is definitely not a nurse and does have some issues with looking at blood. The good news is the wound does not appear to be too deep and is fairly superficial at  this point. There is no slough noted there is some nonviable skin noted around the surface of the wound and the perimeter at this point. The central portion of the wound appears to be very good with a dermal layer noted this does not appear to be again deep enough to extend it to subcutaneous tissue at this point. 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 In the pump he has Dilaudid and baclofen. 12/07/17 on evaluation today patient presents for follow-up concerning his ongoing lower back thigh ulcer on the right. He states that he did not get the supplies ordered and therefore has not really been able to perform the dressing changes as directed exactly. His wife was able to get some Boarder Foam Dressing's from the drugstore and subsequently has been using hydrogel which did help to a degree in the wound does appear to be able smaller. There is actually more drainage this week noted than previous. 12/21/17 on evaluation today patient appears to be doing rather well in regard to his right gluteal ulcer. He has been tolerating the dressing changes without complication. There does not appear to be any evidence of infection at this point in time. Overall the wound does seem to be making some progress as far as the edges are concerned there's not as much in the way of overlapping of the external wound edges and he has a good epithelium to wound bed border for the most part. This however is not true right at the 12 o'clock location over the span of a little over a centimeters  which actually will require debridement today to clean this away and hopefully allow it to continue to heal more appropriately. 12/28/17 on evaluation today patient appears to be doing rather well in regard to his ulcer in the left gluteal region. He's been tolerating the dressing changes without complication. Apparently he has had some difficulty getting his dressing material. Apparently there's been some confusion with ordering we're gonna check into this. Nonetheless overall he's been showing signs of improvement which is good news. Debridement is not required today. 01/04/18 on evaluation today patient presents for follow-up concerning his right gluteal ulcer. He has been tolerating the dressing changes fairly well. On inspection today it appears he may actually have some maceration them concerned about the fact that he may be developing too much moisture in and around the wound bed which can cause delay in healing. With that being said he unfortunately really has not showed significant signs of improvement since last week's evaluation in fact this may even be just the little bit/slightly larger. Nonetheless he's been having a lot of discomfort I'm not sure this is even related to the wound as he has no pain when I'm to breeding or otherwise cleaning the wound during evaluation today. Nonetheless this is something that we did recommend he talked to his pain specialist concerning. 01/11/18 on evaluation today patient appears to be doing better in regard to his ulceration. He has been tolerating the dressing changes without complication. With that being said overall there's no evidence of infection which is good news. The only thing is he did receive the hatch affair blue classic versus the ready nonetheless I feel like this is perfectly fine and appears to have done well for him over the past week. 01/25/18 on evaluation today patient's wound actually appears to be a little bit larger  than during the  last evaluation. The good SHYAM, DAWSON. ([id number]) news is the majority of the wound edges actually appear to be fairly firmly attached to the wound bed unfortunately again we're not really making progress in regard to the size. Roughly the wound is about the same size as when I first saw him although again the wound margin/edges appear to be much better. Electronic Signature(s) Signed: 01/25/2018 8:46:34 PM By: Worthy Keeler [name] Entered By: Worthy Keeler on 01/25/2018 08:56:19 PAWEL, SOULES ([id number]) -------------------------------------------------------------------------------- Physical Exam Details Patient Name: [name] Date of Service: 01/25/2018 8:30 AM Medical Record Number: [id number] Patient Account Number: [id number] Date of Birth/Sex: [birth year] (67 y.o. M) Treating RN: Roger Shelter Primary Care Provider: [name] Other Clinician: Referring Provider: [name] Treating Provider/Extender: [name], HOYT Weeks in Treatment: 8 Constitutional Well-nourished and well-hydrated in no acute distress. Respiratory normal breathing without difficulty. Psychiatric this patient is able to make decisions and demonstrates good insight into disease process. Alert and Oriented x 3. pleasant and cooperative. Notes There is no evidence of infection at this point in time he's been tolerating the dressing changes without any pain and there's no purulent discharge. In general his sheer forces seem to be better in regard to the fact that he is no longer losing the dressings when he is transferring all of I still think sitting for long periods of time may be part of the issue here he states when he was first here he was able to stand somewhat for a period of time now he's getting to the point where that's not even a possibility. Electronic Signature(s) Signed: 01/25/2018 8:46:34 PM By: Worthy Keeler [name] Entered By: Worthy Keeler on 01/25/2018  08:57:07 ALFERD, OBRYANT ([id number]) -------------------------------------------------------------------------------- Physician Orders Details Patient Name: [name] Date of Service: 01/25/2018 8:30 AM Medical Record Number: [id number] Patient Account Number: [id number] Date of Birth/Sex: [birth year] (67 y.o. 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 Anesthetic (add to Medication List) Wound #1 Right Gluteal fold o Topical Lidocaine 4% cream applied to wound bed prior to debridement (In Clinic Only). Skin Barriers/Peri-Wound Care Wound #1 Right Gluteal fold o Skin Prep Primary Wound Dressing Wound #1 Right Gluteal fold o Silver Collagen - do not wet with saline. apply dry. Secondary Dressing Wound #1 Right Gluteal fold o Boardered Foam Dressing Dressing Change Frequency Wound #1 Right Gluteal fold o Change dressing every other day. Follow-up Appointments Wound #1 Right Gluteal fold o Return Appointment in 1 week. Off-Loading Wound #1 Right Gluteal fold o Turn and reposition every 2 hours TURHAN, CHILL ([id number]) Electronic Signature(s) Signed: 01/25/2018 8:46:34 PM By: Worthy Keeler [name] Signed: 01/26/2018 11:46:23 AM By: Roger Shelter Entered By: Roger Shelter on 01/25/2018 08:52:49 TZION, WEDEL ([id number]) -------------------------------------------------------------------------------- Problem List Details Patient  Name: [name] Date of Service: 01/25/2018 8:30 AM Medical Record Number: [id number] Patient Account Number: [id number] Date of Birth/Sex: September 28, 1950 (67 y.o. 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, stage 3 11/30/2017 No Yes S34.109A Unspecified injury to unspecified level of lumbar spinal cord, 11/30/2017 No Yes initial encounter Fate (primary) hypertension 11/30/2017 No Yes Inactive Problems Resolved Problems Electronic Signature(s) Signed: 01/25/2018 8:46:34 PM By: Worthy Keeler [name] Entered By: Worthy Keeler on 01/25/2018 08:40:52 Arenson, [name] ([id number]) -------------------------------------------------------------------------------- Progress Note Details Patient Name: [name] Date of Service: 01/25/2018 8:30 AM Medical Record Number: [id number] Patient Account Number: [id number] Date of Birth/Sex: 04-20-1951 (67 y.o. 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 He states that there is no history of diabetes that he is aware of. He does have issues with his prostate and is currently receiving  treatment for this by way of oral medication. With that being said I do not have a lot of details in that regard. Nonetheless the patient presents today as a result of having been referred to [place] by another provider initially home health was set to come out and take care of his wound although due to the fact that he apparently drives he's not able to receive home health. His wife is therefore trying to help take care of this wound within although they have been struggling with what exactly to do at this point. She states that she can do some things but she is definitely not a nurse and does have some issues with looking at blood. The good news is the wound does not appear to be too deep and is fairly superficial at this point. There is no slough noted there is some nonviable skin noted around the surface of the wound and the perimeter at this point. The central portion of the wound appears to be very good with a dermal layer noted this does not appear to be again deep enough to extend it to subcutaneous tissue at this point. 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 In the pump he has Dilaudid and baclofen. 12/07/17 on evaluation today patient presents for follow-up concerning his ongoing lower back thigh ulcer on the right. He states that he did not get the supplies ordered and therefore has not really been able to perform the dressing changes as directed exactly. His wife was able to get some Boarder Foam Dressing's from the drugstore and subsequently has been using hydrogel which did help to a degree in the wound does appear to be able smaller. There is actually more drainage this week noted than previous. 12/21/17 on evaluation today patient appears to be doing rather well in regard to his right gluteal ulcer. He has been tolerating the dressing changes without complication. There does not  appear to be any evidence of infection at this point in time. Overall the wound does seem to be making some progress as far as the edges are concerned there's  not as much in the way of overlapping of the external wound edges and he has a good epithelium to wound bed border for the most part. This however is not true right at the 12 o'clock location over the span of a little over a centimeters which actually will require debridement today to clean this away and hopefully allow it to continue to heal more appropriately. 12/28/17 on evaluation today patient appears to be doing rather well in regard to his ulcer in the left gluteal region. He's been tolerating the dressing changes without complication. Apparently he has had some difficulty getting his dressing material. Apparently there's been some confusion with ordering we're gonna check into this. Nonetheless overall he's been showing signs of improvement which is good news. Debridement is not required today. 01/04/18 on evaluation today patient presents for follow-up concerning his right gluteal ulcer. He has been tolerating the dressing changes fairly well. On inspection today it appears he may actually have some maceration them concerned about the fact that he may be developing too much moisture in and around the wound bed which can cause delay in healing. With that being said he unfortunately really has not showed significant signs of improvement since last week's evaluation in fact this may even be just the little bit/slightly larger. Nonetheless he's been having a lot of discomfort I'm not sure this is even related to the wound as he has no pain when I'm to breeding or otherwise cleaning the wound during evaluation today. Nonetheless this is something that we did recommend he talked to his pain specialist concerning. 01/11/18 on evaluation today patient appears to be doing better in regard to his ulceration. He has been tolerating the  dressing Gatley, Quashon J. ([id number]) changes without complication. With that being said overall there's no evidence of infection which is good news. The only thing is he did receive the hatch affair blue classic versus the ready nonetheless I feel like this is perfectly fine and appears to have done well for him over the past week. 01/25/18 on evaluation today patient's wound actually appears to be a little bit larger than during the last evaluation. The good news is the majority of the wound edges actually appear to be fairly firmly attached to the wound bed unfortunately again we're not really making progress in regard to the size. Roughly the wound is about the same size as when I first saw him although again the wound margin/edges appear to be much better. Patient History Information obtained from Patient. Family History Hypertension - Father, Stroke - Mother, No family history of Cancer, Diabetes, Heart Disease, Kidney Disease, Lung Disease, Seizures, Thyroid Problems, Tuberculosis. Social History Never smoker, Marital Status - Married, Alcohol Use - Never, Drug Use - No History, Caffeine Use - Daily. Medical And Surgical History Notes Oncologic Prostate cancer- currently treated with horomone therapy Review of Systems (ROS) Constitutional Symptoms (General Health) Denies complaints or symptoms of Fever, Chills. Respiratory The patient has no complaints or symptoms. Cardiovascular The patient has no complaints or symptoms. Psychiatric The patient has no complaints or symptoms. Objective Constitutional Well-nourished and well-hydrated in no acute distress. Vitals Time Taken: 8:33 AM, Height: 73 in, Weight: 210 lbs, BMI: 27.7, Temperature: 98.7 F, Pulse: 96 bpm, Respiratory Rate: 18 breaths/min, Blood Pressure: 159/93 mmHg. Respiratory normal breathing without difficulty. Psychiatric this patient is able to make decisions and demonstrates good insight into disease  process. Alert and Oriented x 3. pleasant and cooperative. MAKAYLA, CONFER ([id number]) General Notes:  There is no evidence of infection at this point in time he's been tolerating the dressing changes without any pain and there's no purulent discharge. In general his sheer forces seem to be better in regard to the fact that he is no longer losing the dressings when he is transferring all of I still think sitting for long periods of time may be part of the issue here he states when he was first here he was able to stand somewhat for a period of time now he's getting to the point where that's not even a possibility. Integumentary (Hair, Skin) Wound #1 status is Open. Original cause of wound was Pressure Injury. The wound is located on the Right Gluteal fold. The wound measures 4.3cm length x 2.9cm width x 0.2cm depth; 9.794cm^2 area and 1.959cm^3 volume. There is Fat Layer (Subcutaneous Tissue) Exposed exposed. There is no tunneling or undermining noted. There is a medium amount of serous drainage noted. The wound margin is flat and intact. There is medium (34-66%) pink granulation within the wound bed. There is a medium (34-66%) amount of necrotic tissue within the wound bed including Adherent Slough. The periwound skin appearance exhibited: Maceration. The periwound skin appearance did not exhibit: Callus, Crepitus, Excoriation, Induration, Rash, Scarring, Dry/Scaly, Atrophie Blanche, Cyanosis, Ecchymosis, Hemosiderin Staining, Mottled, Pallor, Rubor, Erythema. Periwound temperature was noted as No Abnormality. Assessment Active Problems ICD-10 Paraplegia, unspecified Pressure ulcer of right buttock, stage 3 Unspecified injury to unspecified level of lumbar spinal cord, initial encounter Essential (primary) hypertension Procedures Wound #1 Pre-procedure diagnosis of Wound #1 is a Pressure Ulcer located on the Right Gluteal fold . There was a Excisional Skin/Subcutaneous Tissue  Debridement with a total area of 12.47 sq cm performed by [name], HOYT E., [name]. With the following instrument(s): Curette to remove Viable and Non-Viable tissue/material. Material removed includes Subcutaneous Tissue, Slough, and Biofilm after achieving pain control using Other (lidocaine 4%). No specimens were taken. A time out was conducted at 08:46, prior to the start of the procedure. A Moderate amount of bleeding was controlled with Pressure. The procedure was tolerated well with a pain level of 0 throughout and a pain level of 0 following the procedure. Patient s Level of Consciousness post procedure was recorded as Awake and Alert. Post Debridement Measurements: 4.3cm length x 2.9cm width x 0.2cm depth; 1.959cm^3 volume. Post debridement Stage noted as Category/Stage III. Character of Wound/Ulcer Post Debridement is stable. Post procedure Diagnosis Wound #1: Same as Pre-Procedure Plan Wound Cleansing: Wound #1 Right Gluteal fold: Stifter, Jakeem J. ([id number]) Clean wound with Normal Saline. Anesthetic (add to Medication List): Wound #1 Right Gluteal fold: Topical Lidocaine 4% cream applied to wound bed prior to debridement (In Clinic Only). Skin Barriers/Peri-Wound Care: Wound #1 Right Gluteal fold: Skin Prep Primary Wound Dressing: Wound #1 Right Gluteal fold: Silver Collagen - do not wet with saline. apply dry. Secondary Dressing: Wound #1 Right Gluteal fold: Boardered Foam Dressing Dressing Change Frequency: Wound #1 Right Gluteal fold: Change dressing every other day. Follow-up Appointments: Wound #1 Right Gluteal fold: Return Appointment in 1 week. Off-Loading: Wound #1 Right Gluteal fold: Turn and reposition every 2 hours At this point we're gonna give the Prisma one more try before trying to switch to something else if need be over the next week. I would like the same in one weeks time to recheck and see were things stand at that point. If you showing signs  of improvement with the Prisma that I would recommend we  continue with that. Nonetheless in general I'm hopeful that this will definitely show some signs of new epithelialization because otherwise the wound bed appears to be doing excellent it's just a matter of getting new epithelialization along the border and he has good attachment of the margin of the wound at this point. We will see were things stand in one week. Please see above for specific wound care orders. We will see patient for re-evaluation in 1 week(s) here in the clinic. If anything worsens or changes patient will contact our office for additional recommendations. Electronic Signature(s) Signed: 01/25/2018 8:46:34 PM By: Worthy Keeler [name] Entered By: Worthy Keeler on 01/25/2018 09:03:53 MIKIAS, LANZ ([id number]) -------------------------------------------------------------------------------- ROS/PFSH Details Patient Name: [name] Date of Service: 01/25/2018 8:30 AM Medical Record Number: [id number] Patient Account Number: [id number] Date of Birth/Sex: 15-Nov-1950 (67 y.o. M) Treating RN: Roger Shelter Primary Care Provider: [name] Other Clinician: Referring Provider: [name] Treating Provider/Extender: [name], HOYT Weeks in Treatment: 8 Information Obtained From Patient Wound History Do you currently have one or more open woundso Yes How many open wounds do you currently haveo 1 Approximately how long have you had your woundso 1 month How have you been treating your wound(s) until nowo cream Has your wound(s) ever healed and then re-openedo No Have you had any lab work done in the past montho No Have you tested positive for an antibiotic resistant organism (MRSA, VRE)o No Have you tested positive for osteomyelitis (bone infection)o No Have you had any tests for circulation on your legso No Constitutional Symptoms (General Health) Complaints and Symptoms: Negative for:  Fever; Chills Eyes Medical History: Positive for: Cataracts - both removed Negative for: Glaucoma; Optic Neuritis Ear/Nose/Mouth/Throat Medical History: Negative for: Chronic sinus problems/congestion; Middle ear problems Hematologic/Lymphatic Medical History: Negative for: Anemia; Hemophilia; Human Immunodeficiency Virus; Lymphedema Respiratory Complaints and Symptoms: No Complaints or Symptoms Medical History: Negative for: Aspiration; Asthma; Chronic Obstructive Pulmonary Disease (COPD); Pneumothorax; Sleep Apnea; Tuberculosis Cardiovascular Complaints and Symptoms: No Complaints or Symptoms Medical History: Positive for: Hypertension - takes medication KARMINE, KAUER. ([id number]) Negative for: Angina; Arrhythmia; Congestive Heart Failure; Coronary Artery Disease; Deep Vein Thrombosis; Hypotension; Myocardial Infarction; Peripheral Arterial Disease; Peripheral Venous Disease; Phlebitis; Vasculitis Gastrointestinal Medical History: Negative for: Cirrhosis ; Colitis; Crohnos; Hepatitis A; Hepatitis B; Hepatitis C Endocrine Medical History: Negative for: Type I Diabetes; Type II Diabetes Genitourinary Medical History: Negative for: End Stage Renal Disease Immunological Medical History: Negative for: Lupus Erythematosus; Raynaudos; Scleroderma Integumentary (Skin) Medical History: Negative for: History of Burn; History of pressure wounds Musculoskeletal Medical History: Negative for: Gout; Rheumatoid Arthritis; Osteoarthritis; Osteomyelitis Neurologic Medical History: Positive for: Paraplegia - waist down Negative for: Dementia; Neuropathy; Quadriplegia; Seizure Disorder Oncologic Medical History: Negative for: Received Chemotherapy; Received Radiation Past Medical History Notes: Prostate cancer- currently treated with horomone therapy Psychiatric Complaints and Symptoms: No Complaints or Symptoms Medical History: Negative for: Anorexia/bulimia; Confinement  Anxiety HBO Extended History Items Eyes: Cataracts Immunizations RAEFORD, [place]EN[place] ([id number]) Pneumococcal Vaccine: Received Pneumococcal Vaccination: No Implantable Devices Family and Social History Cancer: No; Diabetes: No; Heart Disease: No; Hypertension: Yes - Father; Kidney Disease: No; Lung Disease: No; Seizures: No; Stroke: Yes - Mother; Thyroid Problems: No; Tuberculosis: No; Never smoker; Marital Status - Married; Alcohol Use: Never; Drug Use: No History; Caffeine Use: Daily; Advanced Directives: Yes (Copy provided); Patient does not want information on Advanced Directives; Living Will: Yes (Copy provided) Physician Affirmation I have reviewed and agree with the above information.  Electronic Signature(s) Signed: 01/25/2018 8:46:34 PM By: Worthy Keeler [name] Signed: 01/26/2018 11:46:23 AM By: Roger Shelter Entered By: Worthy Keeler on 01/25/2018 08:56:41 RANDALE, CARVALHO ([id number]) -------------------------------------------------------------------------------- [place] Details Patient Name: [name] Date of Service: 01/25/2018 Medical Record Number: [id number] Patient Account Number: [id number] Date of Birth/Sex: Mar 04, 1951 (67 y.o. 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

## 2018-01-26 NOTE — Progress Notes (Signed)
[name], [name] ([id number]) Visit Report for 01/25/2018 Arrival Information Details Patient Name: [name] Date of Service: 01/25/2018 8:30 AM Medical Record Number: [id number] Patient Account Number: [id number] Date of Birth/Sex: 1951-03-23 (67 y.o. M) Treating RN: [name] Primary Care [name]: [name] Other Clinician: Referring [name]: [name] [name], [name] in Treatment: 8 Visit Information History Since Last Visit All ordered tests and consults were completed: No Patient Arrived: Wheel Chair Added or deleted any medications: No Arrival Time: 08:32 Any new allergies or adverse reactions: No Accompanied By: wife Had a fall or experienced change in No Transfer Assistance: Other activities of daily living that may affect Patient Identification Verified: Yes risk of falls: Secondary Verification Process Completed: Yes Signs or symptoms of abuse/neglect since last visito No Patient Requires Transmission-Based No Hospitalized since last visit: No Precautions: Implantable device outside of the clinic excluding No Patient Has Alerts: Yes cellular tissue based products placed in the center Patient Alerts: NOT since last visit: Diabetic Has Dressing in Place as Prescribed: Yes Pain Present Now: No Electronic Signature(s) Signed: 01/25/2018 5:33:19 PM By: Alric [name] Entered By: Alric [name] on 01/25/2018 08:33:37 [name] ([id number]) -------------------------------------------------------------------------------- Encounter Discharge Information Details Patient Name: [name] Date of Service: 01/25/2018 8:30 AM Medical Record Number: [id number] Patient Account Number: [id number] Date of Birth/Sex: 1951-03-09 (67 y.o. M) Treating RN: [name] Primary Care Darothy Courtright: [name] Other Clinician: Referring [name]: [name] [name], [name] in Treatment: 8 Encounter Discharge Information Items Discharge Condition: Stable Ambulatory Status: Wheelchair Discharge Destination: Home Transportation: Private Auto Accompanied By: self Schedule Follow-up Appointment: Yes Clinical Summary of Care: Electronic Signature(s) Signed: 01/25/2018 10:08:09 AM By: [name] Entered By: [name] on 01/25/2018 10:08:09 [name] ([id number]) -------------------------------------------------------------------------------- Lower Extremity Assessment Details Patient Name: [name] Date of Service: 01/25/2018 8:30 AM Medical Record Number: [id number] Patient Account Number: [id number] Date of Birth/Sex: 04-29-1951 (67 y.o. M) Treating RN: [name] Primary Care Jeremey Bascom: [name] Other Clinician: Referring Maston Wight: [name] Treating Marquesa Rath/Extender: [name], [name] in Treatment: 8 Electronic Signature(s) Signed: 01/25/2018 5:33:19 PM By: Alric [name] Entered By: Alric [name] on 01/25/2018 08:39:00 [name] ([id number]) -------------------------------------------------------------------------------- Multi Wound Chart Details Patient Name: [name] Date of Service: 01/25/2018 8:30 AM Medical Record Number: [id number] Patient Account Number: [id number] Date of Birth/Sex: [birth year] (67 y.o. M) Treating RN: [name] Primary Care Borghild Thaker: [name] Other Clinician: Referring Khelani Kops: [name] [name], [name] in Treatment: 8 Vital Signs Height(in): 73 Pulse(bpm): 96 Weight(lbs): 210 Blood Pressure(mmHg): 159/93 Body Mass Index(BMI): 28 Temperature(F): 98.7 Respiratory Rate 18 (breaths/min): Photos: [1:No Photos] [N/A:N/A] Wound Location: [1:Right Gluteal fold] [N/A:N/A] Wounding Event: [1:Pressure Injury] [N/A:N/A] Primary Etiology: [1:Pressure Ulcer] [N/A:N/A] Comorbid  History: [1:Cataracts, Hypertension, Paraplegia] [N/A:N/A] Date Acquired: [1:11/02/2017] [N/A:N/A] [name] of Treatment: [1:8] [N/A:N/A] Wound Status: [1:Open] [N/A:N/A] Measurements L x W x D [1:4.3x2.9x0.2] [N/A:N/A] (cm) Area (cm) : [1:9.794] [N/A:N/A] Volume (cm) : [1:1.959] [N/A:N/A] % Reduction in Area: [1:2.60%] [N/A:N/A] % Reduction in Volume: [1:-94.90%] [N/A:N/A] Classification: [1:Category/Stage III] [N/A:N/A] Exudate Amount: [1:Medium] [N/A:N/A] Exudate Type: [1:Serous] [N/A:N/A] Exudate Color: [1:amber] [N/A:N/A] Wound Margin: [1:Flat and Intact] [N/A:N/A] Granulation Amount: [1:Medium (34-66%)] [N/A:N/A] Granulation Quality: [1:Pink] [N/A:N/A] Necrotic Amount: [1:Medium (34-66%)] [N/A:N/A] Exposed Structures: [1:Fat Layer (Subcutaneous Tissue) Exposed: Yes Fascia: No Tendon: No Muscle: No Joint: No Bone: No] [N/A:N/A] Epithelialization: [1:Small (1-33%)] [N/A:N/A] Periwound Skin Texture: [1:Excoriation: No Induration: No Callus: No Crepitus: No  Rash: No Scarring: No] [N/A:N/A] Periwound Skin Moisture: Maceration: Yes N/A N/A Dry/Scaly: No Periwound Skin Color: Atrophie Blanche: No N/A N/A Cyanosis: No Ecchymosis: No Erythema: No Hemosiderin Staining: No Mottled: No Pallor: No Rubor: No Temperature: No Abnormality N/A N/A Tenderness on Palpation: No N/A N/A Wound Preparation: Ulcer Cleansing: N/A N/A Rinsed/Irrigated with Saline Topical Anesthetic Applied: Other: lidocaine 4% Treatment Notes Electronic Signature(s) Signed: 01/26/2018 11:46:23 AM By: [name] Entered By: [name] on 01/25/2018 08:42:32 [name] ([id number]) -------------------------------------------------------------------------------- [place] Details Patient Name: [name] Date of Service: 01/25/2018 8:30 AM Medical Record Number: [id number] Patient Account Number: [id number] Date of Birth/Sex: 1951-04-01 (67 y.o. M) Treating RN:  [name] Primary Care [name]: [name] Other Clinician: Referring [name]: [name] [name], [name] in Treatment: 8 Active Inactive ` Orientation to the Wound Care Program Nursing Diagnoses: Knowledge deficit related to the wound healing center program Goals: Patient/caregiver will verbalize understanding of the [facility] Program Date Initiated: 11/30/2017 Target Resolution Date: 12/21/2017 Goal Status: Active Interventions: Provide education on orientation to the wound center Notes: ` Wound/Skin Impairment Nursing Diagnoses: Impaired tissue integrity Goals: Patient/caregiver will verbalize understanding of skin care regimen Date Initiated: 11/30/2017 Target Resolution Date: 12/21/2017 Goal Status: Active Ulcer/skin breakdown will have a volume reduction of 30% by week 4 Date Initiated: 11/30/2017 Target Resolution Date: 12/21/2017 Goal Status: Active Interventions: Assess patient/caregiver ability to obtain necessary supplies Assess patient/caregiver ability to perform ulcer/skin care regimen upon admission and as needed Assess ulceration(s) every visit Treatment Activities: Skin care regimen initiated : 11/30/2017 Notes: Electronic Signature(s) Signed: 01/26/2018 11:46:23 AM By: Patrick North, [name] ([id number]) Entered By: [name] on 01/25/2018 08:42:19 TYRIE, PORZIO ([id number]) -------------------------------------------------------------------------------- Pain Assessment Details Patient Name: [name] Date of Service: 01/25/2018 8:30 AM Medical Record Number: [id number] Patient Account Number: [id number] Date of Birth/Sex: 1950/11/14 (67 y.o. M) Treating RN: [name] Primary Care Endi Lagman: [name] Other Clinician: Referring Lakenzie Mcclafferty: [name] Treating Gaylen Pereira/Extender: [name], [name] in Treatment: 8 Active Problems Location  of Pain Severity and Description of Pain Patient Has Paino No Site Locations Pain Management and Medication Current Pain Management: Electronic Signature(s) Signed: 01/25/2018 5:33:19 PM By: Alric [name] Entered By: Alric [name] on 01/25/2018 08:33:45 [name] ([id number]) -------------------------------------------------------------------------------- Patient/Caregiver Education Details Patient Name: [name] Date of Service: 01/25/2018 8:30 AM Medical Record Number: [id number] Patient Account Number: [id number] Date of Birth/Gender: January 27, 1951 (67 y.o. M) Treating RN: [name] Primary Care Physician: [name] Other Clinician: Referring Physician: Tracie [name] Treating Physician/Extender: Sharalyn Ink in Treatment: 8 Education Assessment Education Provided To: Patient Education Topics Provided Wound/Skin Impairment: Handouts: Other: wound care as ordered Methods: Demonstration, Explain/Verbal Responses: State content correctly Electronic Signature(s) Signed: 01/25/2018 5:34:11 PM By: [name] Entered By: [name] on 01/25/2018 10:08:26 [name] ([id number]) -------------------------------------------------------------------------------- Wound Assessment Details Patient Name: [name] Date of Service: 01/25/2018 8:30 AM Medical Record Number: [id number] Patient Account Number: [id number] Date of Birth/Sex: Apr 23, 1951 (67 y.o. M) Treating RN: [name] Primary Care Elder Davidian: [name] Other Clinician: Referring Joffre Lucks: [name] Treating Oliverio Cho/Extender: [name], [name] in Treatment: 8 Wound Status Wound Number: 1 Primary Etiology: Pressure Ulcer Wound Location: Right Gluteal fold Wound Status: Open Wounding Event: Pressure Injury Comorbid History: Cataracts, Hypertension, Paraplegia Date Acquired: 11/02/2017 [name] Of Treatment: 8 Clustered Wound:  No Photos Photo Uploaded By: Alric [name] on 01/25/2018 15:15:35 Wound Measurements Length: (cm) 4.3  Width: (cm) 2.9 Depth: (cm) 0.2 Area: (cm) 9.794 Volume: (cm) 1.959 % Reduction in Area: 2.6% % Reduction in Volume: -94.9% Epithelialization: Small (1-33%) Tunneling: No Undermining: No Wound Description Classification: Category/Stage III Wound Margin: Flat and Intact Exudate Amount: Medium Exudate Type: Serous Exudate Color: amber Foul Odor After Cleansing: No Slough/Fibrino Yes Wound Bed Granulation Amount: Medium (34-66%) Exposed Structure Granulation Quality: Pink Fascia Exposed: No Necrotic Amount: Medium (34-66%) Fat Layer (Subcutaneous Tissue) Exposed: Yes Necrotic Quality: Adherent Slough Tendon Exposed: No Muscle Exposed: No Joint Exposed: No Bone Exposed: No Periwound Skin Texture Texture Color No Abnormalities Noted: No No Abnormalities Noted: No Callus: No Atrophie Blanche: No MARIO, CORONADO. ([id number]) Crepitus: No Cyanosis: No Excoriation: No Ecchymosis: No Induration: No Erythema: No Rash: No Hemosiderin Staining: No Scarring: No Mottled: No Pallor: No Moisture Rubor: No No Abnormalities Noted: No Dry / Scaly: No Temperature / Pain Maceration: Yes Temperature: No Abnormality Wound Preparation Ulcer Cleansing: Rinsed/Irrigated with Saline Topical Anesthetic Applied: Other: lidocaine 4%, Treatment Notes Wound #1 (Right Gluteal fold) 1. Cleansed with: Clean wound with Normal Saline 4. Dressing Applied: Prisma Ag 5. Secondary Dressing Applied Bordered Foam Dressing Electronic Signature(s) Signed: 01/25/2018 5:33:19 PM By: Alric [name] Entered By: Alric [name] on 01/25/2018 08:37:44 JAIRON, RIPBERGER ([id number]) -------------------------------------------------------------------------------- [place] Details Patient Name: [name] Date of Service: 01/25/2018 8:30 AM Medical Record Number:  [id number] Patient Account Number: [id number] Date of Birth/Sex: 11-May-1951 (67 y.o. M) Treating RN: [name] Primary Care Joseangel Nettleton: [name] Other Clinician: Referring Quoc Tome: [name] Treating Dallie Patton/Extender: [name], [name] in Treatment: 8 Vital Signs Time Taken: 08:33 Temperature (F): 98.7 Height (in): 73 Pulse (bpm): 96 Weight (lbs): 210 Respiratory Rate (breaths/min): 18 Body Mass Index (BMI): 27.7 Blood Pressure (mmHg): 159/93 Reference Range: 80 - 120 mg / dl Electronic Signature(s) Signed: 01/25/2018 5:33:19 PM By: Alric [name] Entered By: Alric [name] on 01/25/2018 08:37:22

## 2018-02-01 ENCOUNTER — Encounter: Payer: Medicare Other | Admitting: Physician Assistant

## 2018-02-01 DIAGNOSIS — L89313 Pressure ulcer of right buttock, stage 3: Secondary | ICD-10-CM | POA: Diagnosis not present

## 2018-02-02 NOTE — Progress Notes (Signed)
AIRAM, HEIDECKER ([id number]) Visit Report for 02/01/2018 Chief Complaint Document Details Patient Name: [name], [name] Date of Service: 02/01/2018 8:45 AM Medical Record Number: [id number] Patient Account Number: [id number] Date of Birth/Sex: [birth year] (67 y.o. M) Treating RN: Roger Shelter Primary Care Provider: [name] Other Clinician: Referring Provider: [name] Treating Provider/Extender: [name], HOYT Weeks in Treatment: 9 Information Obtained from: Patient Chief Complaint Right gluteal fold ulcer Electronic Signature(s) Signed: 02/02/2018 1:18:11 PM By: Worthy Keeler [name] Entered By: Worthy Keeler on 02/01/2018 09:01:43 [name], [name] ([id number]) -------------------------------------------------------------------------------- HPI Details Patient Name: [name] Date of Service: 02/01/2018 8:45 AM Medical Record Number: [id number] Patient Account Number: [id number] Date of Birth/Sex: 11-19-1950 (67 y.o. 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 He states that there is no history of diabetes that he is aware of. He does have issues with his prostate and is currently receiving treatment for this by way of oral medication. With that being said I do not have a lot of details in that regard. Nonetheless the patient presents today as a result of having been referred to [place] by another provider initially home health was set to come out and take care of his wound although due to the fact that  he apparently drives he's not able to receive home health. His wife is therefore trying to help take care of this wound within although they have been struggling with what exactly to do at this point. She states that she can do some things but she is definitely not a nurse and does have some issues with looking at blood. The good news is the wound does not appear to be too deep and is fairly superficial at this point. There is no slough noted there is some nonviable skin noted around the surface of the wound and the perimeter at this point. The central portion of the wound appears to be very good with a dermal layer noted this does not appear to be again deep enough to extend it to subcutaneous tissue at this point. 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 In the pump he has Dilaudid and baclofen. 12/07/17 on evaluation today patient presents for follow-up concerning his ongoing lower back thigh ulcer on the right. He states that he did not get the supplies ordered and therefore has not really been able to perform the dressing changes as directed exactly. His wife was able to get some Boarder Foam Dressing's from the drugstore and subsequently has been using hydrogel which did help to a degree in the wound does appear to be able smaller. There is actually more drainage this week noted than previous. 12/21/17 on evaluation today patient appears to be doing rather well in regard to his right gluteal ulcer. He has been tolerating the dressing changes without complication. There does not appear to be any evidence of infection at this point in time. Overall the wound does seem to be making some progress as far as the edges  are concerned there's not as much in the way of overlapping of the external wound edges and he has a good epithelium to wound bed border for the most part. This however is not true  right at the 12 o'clock location over the span of a little over a centimeters which actually will require debridement today to clean this away and hopefully allow it to continue to heal more appropriately. 12/28/17 on evaluation today patient appears to be doing rather well in regard to his ulcer in the left gluteal region. He's been tolerating the dressing changes without complication. Apparently he has had some difficulty getting his dressing material. Apparently there's been some confusion with ordering we're gonna check into this. Nonetheless overall he's been showing signs of improvement which is good news. Debridement is not required today. 01/04/18 on evaluation today patient presents for follow-up concerning his right gluteal ulcer. He has been tolerating the dressing changes fairly well. On inspection today it appears he may actually have some maceration them concerned about the fact that he may be developing too much moisture in and around the wound bed which can cause delay in healing. With that being said he unfortunately really has not showed significant signs of improvement since last week's evaluation in fact this may even be just the little bit/slightly larger. Nonetheless he's been having a lot of discomfort I'm not sure this is even related to the wound as he has no pain when I'm to breeding or otherwise cleaning the wound during evaluation today. Nonetheless this is something that we did recommend he talked to his pain specialist concerning. 01/11/18 on evaluation today patient appears to be doing better in regard to his ulceration. He has been tolerating the dressing changes without complication. With that being said overall there's no evidence of infection which is good news. The only thing is he did receive the hatch affair blue classic versus the ready nonetheless I feel like this is perfectly fine and appears to have done well for him over the past week. 01/25/18 on evaluation  today patient's wound actually appears to be a little bit larger than during the last evaluation. The good [name]. ([id number]) news is the majority of the wound edges actually appear to be fairly firmly attached to the wound bed unfortunately again we're not really making progress in regard to the size. Roughly the wound is about the same size as when I first saw him although again the wound margin/edges appear to be much better. 02/01/18 on evaluation today patient actually appears to be doing very well in regard to his wound. Applying the Prisma dry does seem to be better although he does still have issues with slow progression of the wound. There was a slight improvement compared to last week's measurements today. Nonetheless I have been considering other options as far as the possibility of Theraskin or even a snap vac. In general I'm not sure that the Theraskin due to location of the wound would be a very good idea. Nonetheless I do think that a snap vac could be a possibility for the patient and in fact I think this could even be an excellent way to manage the wound possibly seeing some improvement in a very rapid fashion here. Nonetheless this is something that we would need to get approved and I did have a lengthy conversation with the patient about this today. Electronic Signature(s) Signed: 02/02/2018 1:18:11 PM By: Worthy Keeler [name] Entered By: Worthy Keeler on  02/01/2018 09:24:11 [name], [name] ([id number]) -------------------------------------------------------------------------------- Physical Exam Details Patient Name: [name], [name] Date of Service: 02/01/2018 8:45 AM Medical Record Number: [id number] Patient Account Number: [id number] Date of Birth/Sex: 1951/03/04 (67 y.o. M) Treating RN: Roger Shelter Primary Care Provider: [name] Other Clinician: Referring Provider: [name] Treating Provider/Extender: [name], HOYT Weeks in  Treatment: 9 Constitutional Well-nourished and well-hydrated in no acute distress. Respiratory normal breathing without difficulty. clear to auscultation bilaterally. Cardiovascular regular rate and rhythm with normal S1, S2. Psychiatric this patient is able to make decisions and demonstrates good insight into disease process. Alert and Oriented x 3. pleasant and cooperative. Notes At this point patient's wound actually shows some signs again of improvement he still does have slow progression and evidence of some maceration around the edges of the wound that has me somewhat concerned in that regard. Nonetheless he did make some progress which is at least good news. No debridement was required today based on the overall appearance of the wound bed which was good news as well. Fortunately there does not appear to be any evidence of infection. He seems to be doing well trying to keep off of this and avoiding shear and friction injury. Electronic Signature(s) Signed: 02/02/2018 1:18:11 PM By: Worthy Keeler [name] Entered By: Worthy Keeler on 02/01/2018 09:24:47 CAYDON, FEASEL ([id number]) -------------------------------------------------------------------------------- Physician Orders Details Patient Name: [name] Date of Service: 02/01/2018 8:45 AM Medical Record Number: [id number] Patient Account Number: [id number] Date of Birth/Sex: Jul 07, 1951 (67 y.o. 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 Anesthetic (add to Medication List) Wound #1 Right Gluteal fold o Topical Lidocaine 4% cream applied to wound bed prior to debridement (In Clinic Only). Skin Barriers/Peri-Wound Care Wound #1 Right Gluteal fold o Skin Prep Primary Wound Dressing Wound #1 Right Gluteal fold o Silver Collagen - do not wet with saline. apply dry. Secondary Dressing Wound #1 Right Gluteal fold o Boardered Foam Dressing Dressing Change Frequency Wound #1 Right Gluteal fold o Change dressing every other day. Follow-up Appointments Wound #1 Right Gluteal fold o Return Appointment in 1 week. Off-Loading Wound #1 Right Gluteal fold o Turn and reposition every 2 hours [name], [name] ([id number]) Electronic Signature(s) Signed: 02/01/2018 5:44:59 PM By: Roger Shelter Signed: 02/02/2018 1:18:11 PM By: Worthy Keeler [name] Entered By: Roger Shelter on 02/01/2018 09:16:37 [name], [name] ([id number]) -------------------------------------------------------------------------------- Problem List Details Patient Name: [name] Date of Service: 02/01/2018 8:45 AM Medical Record Number: [id number] Patient Account Number: [id number] Date of Birth/Sex: Feb 27, 1951 (67 y.o. 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, stage 3 11/30/2017 No Yes S34.109A Unspecified injury to unspecified level of lumbar spinal cord, 11/30/2017 No Yes initial encounter [place] (primary) hypertension 11/30/2017 No Yes Inactive Problems Resolved Problems Electronic Signature(s) Signed: 02/02/2018 1:18:11 PM By: Worthy Keeler  [name] Entered By: [name]  III, Margarita Grizzle on 02/01/2018 09:01:37 [name], [name] ([id number]) -------------------------------------------------------------------------------- Progress Note Details Patient Name: [name], [name] Date of Service: 02/01/2018 8:45 AM Medical Record Number: [id number] Patient Account Number: [id number] Date of Birth/Sex: 01/11/1951 (67 y.o. 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 He states that there is no history of diabetes that he is aware of. He does have issues with his prostate and is currently receiving treatment for this by way of oral medication. With that being said I do not have a lot of details in that regard. Nonetheless the patient presents today as a result of having been referred to [place] by another provider initially home health was set to come out and take care of his wound although due to the fact that he apparently drives he's not able to receive home health. His wife is therefore trying to help take care of this wound within although they have been struggling with what exactly to do at this point. She states that she can do some things but she is definitely not a nurse and does have some issues with looking at blood. The good news is the wound does not appear to be too deep and is fairly superficial at this point. There is no slough noted there is some nonviable skin noted around the surface of the wound and the perimeter at  this point. The central portion of the wound appears to be very good with a dermal layer noted this does not appear to be again deep enough to extend it to subcutaneous tissue at this point. 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 In the pump he has Dilaudid and baclofen. 12/07/17 on evaluation today patient presents for follow-up concerning his ongoing lower back thigh ulcer on the right. He states that he did not get the supplies ordered and therefore has not really been able to perform the dressing changes as directed exactly. His wife was able to get some Boarder Foam Dressing's from the drugstore and subsequently has been using hydrogel which did help to a degree in the wound does appear to be able smaller. There is actually more drainage this week noted than previous. 12/21/17 on evaluation today patient appears to be doing rather well in regard to his right gluteal ulcer. He has been tolerating the dressing changes without complication. There does not appear to be any evidence of infection at this point in time. Overall the wound does seem to be making some progress as far as the edges are concerned there's not as much in the way of overlapping of the external wound edges and he has a good epithelium to wound bed border for the most part. This however is not true right at the 12 o'clock location over the span of a little over a centimeters which actually will require debridement today to clean this away and hopefully allow it to continue to heal more appropriately. 12/28/17 on evaluation today patient  appears to be doing rather well in regard to his ulcer in the left gluteal region. He's been tolerating the dressing changes without complication. Apparently he has had some difficulty getting his dressing material. Apparently there's been some confusion with ordering we're gonna check into  this. Nonetheless overall he's been showing signs of improvement which is good news. Debridement is not required today. 01/04/18 on evaluation today patient presents for follow-up concerning his right gluteal ulcer. He has been tolerating the dressing changes fairly well. On inspection today it appears he may actually have some maceration them concerned about the fact that he may be developing too much moisture in and around the wound bed which can cause delay in healing. With that being said he unfortunately really has not showed significant signs of improvement since last week's evaluation in fact this may even be just the little bit/slightly larger. Nonetheless he's been having a lot of discomfort I'm not sure this is even related to the wound as he has no pain when I'm to breeding or otherwise cleaning the wound during evaluation today. Nonetheless this is something that we did recommend he talked to his pain specialist concerning. 01/11/18 on evaluation today patient appears to be doing better in regard to his ulceration. He has been tolerating the dressing Fountain, Delvon J. ([id number]) changes without complication. With that being said overall there's no evidence of infection which is good news. The only thing is he did receive the hatch affair blue classic versus the ready nonetheless I feel like this is perfectly fine and appears to have done well for him over the past week. 01/25/18 on evaluation today patient's wound actually appears to be a little bit larger than during the last evaluation. The good news is the majority of the wound edges actually appear to be fairly firmly attached to the wound bed unfortunately again we're not really making progress in regard to the size. Roughly the wound is about the same size as when I first saw him although again the wound margin/edges appear to be much better. 02/01/18 on evaluation today patient actually appears to be doing very well in regard to  his wound. Applying the Prisma dry does seem to be better although he does still have issues with slow progression of the wound. There was a slight improvement compared to last week's measurements today. Nonetheless I have been considering other options as far as the possibility of Theraskin or even a snap vac. In general I'm not sure that the Theraskin due to location of the wound would be a very good idea. Nonetheless I do think that a snap vac could be a possibility for the patient and in fact I think this could even be an excellent way to manage the wound possibly seeing some improvement in a very rapid fashion here. Nonetheless this is something that we would need to get approved and I did have a lengthy conversation with the patient about this today. Patient History Information obtained from Patient. Family History Hypertension - Father, Stroke - Mother, No family history of Cancer, Diabetes, Heart Disease, Kidney Disease, Lung Disease, Seizures, Thyroid Problems, Tuberculosis. Social History Never smoker, Marital Status - Married, Alcohol Use - Never, Drug Use - No History, Caffeine Use - Daily. Medical And Surgical History Notes Oncologic Prostate cancer- currently treated with horomone therapy Review of Systems (ROS) Constitutional Symptoms (General Health) Denies complaints or symptoms of Fever, Chills, Marked Weight Change. Respiratory The patient has no complaints or symptoms. Cardiovascular The  patient has no complaints or symptoms. Psychiatric The patient has no complaints or symptoms. Objective Constitutional Well-nourished and well-hydrated in no acute distress. Vitals Time Taken: 8:46 AM, Height: 73 in, Weight: 210 lbs, BMI: 27.7, Temperature: 98.5 F, Pulse: 80 bpm, Respiratory Rate: 18 breaths/min, Blood Pressure: 119/76 mmHg. [name], [name] ([id number]) Respiratory normal breathing without difficulty. clear to auscultation  bilaterally. Cardiovascular regular rate and rhythm with normal S1, S2. Psychiatric this patient is able to make decisions and demonstrates good insight into disease process. Alert and Oriented x 3. pleasant and cooperative. General Notes: At this point patient's wound actually shows some signs again of improvement he still does have slow progression and evidence of some maceration around the edges of the wound that has me somewhat concerned in that regard. Nonetheless he did make some progress which is at least good news. No debridement was required today based on the overall appearance of the wound bed which was good news as well. Fortunately there does not appear to be any evidence of infection. He seems to be doing well trying to keep off of this and avoiding shear and friction injury. Integumentary (Hair, Skin) Wound #1 status is Open. Original cause of wound was Pressure Injury. The wound is located on the Right Gluteal fold. The wound measures 4.4cm length x 2.7cm width x 0.2cm depth; 9.331cm^2 area and 1.866cm^3 volume. There is Fat Layer (Subcutaneous Tissue) Exposed exposed. There is no tunneling or undermining noted. There is a large amount of serous drainage noted. The wound margin is flat and intact. There is no granulation within the wound bed. There is a large (67-100%) amount of necrotic tissue within the wound bed including Adherent Slough. The periwound skin appearance exhibited: Maceration. The periwound skin appearance did not exhibit: Callus, Crepitus, Excoriation, Induration, Rash, Scarring, Dry/Scaly, Atrophie Blanche, Cyanosis, Ecchymosis, Hemosiderin Staining, Mottled, Pallor, Rubor, Erythema. Periwound temperature was noted as No Abnormality. Assessment Active Problems ICD-10 Paraplegia, unspecified Pressure ulcer of right buttock, stage 3 Unspecified injury to unspecified level of lumbar spinal cord, initial encounter Essential (primary) hypertension Plan Wound  Cleansing: Wound #1 Right Gluteal fold: Clean wound with Normal Saline. Anesthetic (add to Medication List): Wound #1 Right Gluteal fold: Topical Lidocaine 4% cream applied to wound bed prior to debridement (In Clinic Only). Skin Barriers/Peri-Wound Care: Wound #1 Right Gluteal fold: Skin Prep Primary Wound Dressing: [name], [place]. ([id number]) Wound #1 Right Gluteal fold: Silver Collagen - do not wet with saline. apply dry. Secondary Dressing: Wound #1 Right Gluteal fold: Boardered Foam Dressing Dressing Change Frequency: Wound #1 Right Gluteal fold: Change dressing every other day. Follow-up Appointments: Wound #1 Right Gluteal fold: Return Appointment in 1 week. Off-Loading: Wound #1 Right Gluteal fold: Turn and reposition every 2 hours At this point my suggestion is continue with the Prisma applied dry for the next week. I'm gonna also go ahead and have the snap vac approval checked to see if this is something that we could initiate at his next visit. The patient is in agreement with plan. If anything changes in the meantime he or his wife will let me know otherwise it was a pleasure seeing him today and hopefully things will continue to show signs of good improvement. Please see above for specific wound care orders. We will see patient for re-evaluation in 1 week(s) here in the clinic. If anything worsens or changes patient will contact our office for additional recommendations. Electronic Signature(s) Signed: 02/02/2018 1:18:11 PM By: Worthy Keeler [name] Entered  By: Worthy Keeler on 02/01/2018 09:25:11 [name], [name] ([id number]) -------------------------------------------------------------------------------- ROS/PFSH Details Patient Name: [name] Date of Service: 02/01/2018 8:45 AM Medical Record Number: [id number] Patient Account Number: [id number] Date of Birth/Sex: Jul 10, 1951 (67 y.o. M) Treating RN: Roger Shelter Primary Care Provider:  [name] Other Clinician: Referring Provider: [name] Treating Provider/Extender: [name], HOYT Weeks in Treatment: 9 Information Obtained From Patient Wound History Do you currently have one or more open woundso Yes How many open wounds do you currently haveo 1 Approximately how long have you had your woundso 1 month How have you been treating your wound(s) until nowo cream Has your wound(s) ever healed and then re-openedo No Have you had any lab work done in the past montho No Have you tested positive for an antibiotic resistant organism (MRSA, VRE)o No Have you tested positive for osteomyelitis (bone infection)o No Have you had any tests for circulation on your legso No Constitutional Symptoms (General Health) Complaints and Symptoms: Negative for: Fever; Chills; Marked Weight Change Eyes Medical History: Positive for: Cataracts - both removed Negative for: Glaucoma; Optic Neuritis Ear/Nose/Mouth/Throat Medical History: Negative for: Chronic sinus problems/congestion; Middle ear problems Hematologic/Lymphatic Medical History: Negative for: Anemia; Hemophilia; Human Immunodeficiency Virus; Lymphedema Respiratory Complaints and Symptoms: No Complaints or Symptoms Medical History: Negative for: Aspiration; Asthma; Chronic Obstructive Pulmonary Disease (COPD); Pneumothorax; Sleep Apnea; Tuberculosis Cardiovascular Complaints and Symptoms: No Complaints or Symptoms Medical History: Positive for: Hypertension - takes medication [name], [name]. ([id number]) Negative for: Angina; Arrhythmia; Congestive Heart Failure; Coronary Artery Disease; Deep Vein Thrombosis; Hypotension; Myocardial Infarction; Peripheral Arterial Disease; Peripheral Venous Disease; Phlebitis; Vasculitis Gastrointestinal Medical History: Negative for: Cirrhosis ; Colitis; Crohnos; Hepatitis A; Hepatitis B; Hepatitis C Endocrine Medical History: Negative for: Type I Diabetes;  Type II Diabetes Genitourinary Medical History: Negative for: End Stage Renal Disease Immunological Medical History: Negative for: Lupus Erythematosus; Raynaudos; Scleroderma Integumentary (Skin) Medical History: Negative for: History of Burn; History of pressure wounds Musculoskeletal Medical History: Negative for: Gout; Rheumatoid Arthritis; Osteoarthritis; Osteomyelitis Neurologic Medical History: Positive for: Paraplegia - waist down Negative for: Dementia; Neuropathy; Quadriplegia; Seizure Disorder Oncologic Medical History: Negative for: Received Chemotherapy; Received Radiation Past Medical History Notes: Prostate cancer- currently treated with horomone therapy Psychiatric Complaints and Symptoms: No Complaints or Symptoms Medical History: Negative for: Anorexia/bulimia; Confinement Anxiety HBO Extended History Items Eyes: Cataracts Immunizations KYDAN, SHANHOLTZER ([id number]) Pneumococcal Vaccine: Received Pneumococcal Vaccination: No Implantable Devices Family and Social History Cancer: No; Diabetes: No; Heart Disease: No; Hypertension: Yes - Father; Kidney Disease: No; Lung Disease: No; Seizures: No; Stroke: Yes - Mother; Thyroid Problems: No; Tuberculosis: No; Never smoker; Marital Status - Married; Alcohol Use: Never; Drug Use: No History; Caffeine Use: Daily; Advanced Directives: Yes (Copy provided); Patient does not want information on Advanced Directives; Living Will: Yes (Copy provided) Physician Affirmation I have reviewed and agree with the above information. Electronic Signature(s) Signed: 02/01/2018 5:44:59 PM By: Roger Shelter Signed: 02/02/2018 1:18:11 PM By: Worthy Keeler [name] Entered By: Worthy Keeler on 02/01/2018 09:24:26 LATHON, ADAN ([id number]) -------------------------------------------------------------------------------- SuperBill Details Patient Name: [name] Date of Service: 02/01/2018 Medical Record  Number: [id number] Patient Account Number: [id number] Date of Birth/Sex: 05-31-1951 (67 y.o. 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, stage 3 S34.109A Unspecified injury to unspecified level  of lumbar spinal cord, initial encounter [place] (primary) hypertension Facility Procedures CPT4 Code: 06301601 Description: [phone] - WOUND CARE VISIT-LEV 2 EST PT Modifier: Quantity: 1 Physician Procedures CPT4 Code Description: [id number] - WC PHYS LEVEL 3 - EST PT ICD-10 Diagnosis Description G82.20 Paraplegia, unspecified S34.109A Unspecified injury to unspecified level of lumbar spinal cord, 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

## 2018-02-02 NOTE — Progress Notes (Signed)
[name], [name] ([id number]) Visit Report for 02/01/2018 Arrival Information Details Patient Name: [name], [name] Date of Service: 02/01/2018 8:45 AM Medical Record Number: [id number] Patient Account Number: [id number] Date of Birth/Sex: 1950-12-16 (67 y.o. M) Treating RN: Ahmed Prima Primary Care Ziyad Dyar: [name] Other Clinician: Referring Reyaansh Merlo: [name] [name], [name] Weeks in Treatment: 9 Visit Information History Since Last Visit All ordered tests and consults were completed: No Patient Arrived: Wheel Chair Added or deleted any medications: No Arrival Time: 08:44 Any new allergies or adverse reactions: No Accompanied By: wife Had a fall or experienced change in No Transfer Assistance: None activities of daily living that may affect Patient Identification Verified: Yes risk of falls: Secondary Verification Process Completed: Yes Signs or symptoms of abuse/neglect since last visito No Patient Requires Transmission-Based No Hospitalized since last visit: No Precautions: Implantable device outside of the clinic excluding No Patient Has Alerts: Yes cellular tissue based products placed in the center Patient Alerts: NOT since last visit: Diabetic Has Dressing in Place as Prescribed: Yes Pain Present Now: No Electronic Signature(s) Signed: 02/01/2018 5:41:08 PM By: Alric Quan Entered By: Alric Quan on 02/01/2018 08:46:09 [name] ([id number]) -------------------------------------------------------------------------------- Clinic Level of Care Assessment Details Patient Name: [name] Date of Service: 02/01/2018 8:45 AM Medical Record Number: [id number] Patient Account Number: [id number] Date of Birth/Sex: [birth year] (67 y.o. M) Treating RN: Roger Shelter Primary Care Teran Daughenbaugh: [name] Other Clinician: Referring Liora Myles: [name] Treating Jasleen Riepe/Extender:  [name], [name] Weeks in Treatment: 9 Clinic Level of Care Assessment Items TOOL 4 Quantity Score X - Use when only an EandM is performed on FOLLOW-UP visit 1 0 ASSESSMENTS - Nursing Assessment / Reassessment X - Reassessment of Co-morbidities (includes updates in patient status) 1 10 X- 1 5 Reassessment of Adherence to Treatment Plan ASSESSMENTS - Wound and Skin Assessment / Reassessment X - Simple Wound Assessment / Reassessment - one wound 1 5 []  - 0 Complex Wound Assessment / Reassessment - multiple wounds []  - 0 Dermatologic / Skin Assessment (not related to wound area) ASSESSMENTS - Focused Assessment []  - Circumferential Edema Measurements - multi extremities 0 []  - 0 Nutritional Assessment / Counseling / Intervention []  - 0 Lower Extremity Assessment (monofilament, tuning fork, pulses) []  - 0 Peripheral Arterial Disease Assessment (using hand held doppler) ASSESSMENTS - Ostomy and/or Continence Assessment and Care []  - Incontinence Assessment and Management 0 []  - 0 Ostomy Care Assessment and Management (repouching, etc.) PROCESS - Coordination of Care X - Simple Patient / Family Education for ongoing care 1 15 []  - 0 Complex (extensive) Patient / Family Education for ongoing care []  - 0 Staff obtains Programmer, systems, Records, Test Results / Process Orders []  - 0 Staff telephones HHA, Nursing Homes / Clarify orders / etc []  - 0 Routine Transfer to another Facility (non-emergent condition) []  - 0 Routine Hospital Admission (non-emergent condition) []  - 0 New Admissions / Biomedical engineer / Ordering NPWT, Apligraf, etc. []  - 0 Emergency Hospital Admission (emergent condition) X- 1 10 Simple Discharge Coordination [name], BYRNS. ([id number]) []  - 0 Complex (extensive) Discharge Coordination PROCESS - Special Needs []  - Pediatric / Minor Patient Management 0 []  - 0 Isolation Patient Management []  - 0 Hearing / Language / Visual special needs []  -  0 Assessment of Community assistance (transportation, D/C planning, etc.) []  - 0 Additional assistance / Altered mentation []  - 0 Support Surface(s) Assessment (bed, cushion, seat, etc.) INTERVENTIONS - Wound Cleansing / Measurement X -  Simple Wound Cleansing - one wound 1 5 []  - 0 Complex Wound Cleansing - multiple wounds X- 1 5 Wound Imaging (photographs - any number of wounds) []  - 0 Wound Tracing (instead of photographs) X- 1 5 Simple Wound Measurement - one wound []  - 0 Complex Wound Measurement - multiple wounds INTERVENTIONS - Wound Dressings X - Small Wound Dressing one or multiple wounds 1 10 []  - 0 Medium Wound Dressing one or multiple wounds []  - 0 Large Wound Dressing one or multiple wounds []  - 0 Application of Medications - topical []  - 0 Application of Medications - injection INTERVENTIONS - Miscellaneous []  - External ear exam 0 []  - 0 Specimen Collection (cultures, biopsies, blood, body fluids, etc.) []  - 0 Specimen(s) / Culture(s) sent or taken to Lab for analysis []  - 0 Patient Transfer (multiple staff / Civil Service fast streamer / Similar devices) []  - 0 Simple Staple / Suture removal (25 or less) []  - 0 Complex Staple / Suture removal (26 or more) []  - 0 Hypo / Hyperglycemic Management (close monitor of Blood Glucose) []  - 0 Ankle / Brachial Index (ABI) - do not check if billed separately X- 1 5 Vital Signs [name], [name] ([id number]) Has the patient been seen at the hospital within the last three years: Yes Total Score: 75 Level Of Care: New/Established - Level 2 Electronic Signature(s) Signed: 02/01/2018 5:44:59 PM By: Roger Shelter Entered By: Roger Shelter on 02/01/2018 09:17:02 [name] ([id number]) -------------------------------------------------------------------------------- Encounter Discharge Information Details Patient Name: [name] Date of Service: 02/01/2018 8:45 AM Medical Record Number: [id number] Patient  Account Number: [id number] Date of Birth/Sex: June 13, 1951 (67 y.o. M) Treating RN: Roger Shelter Primary Care Jadian Karman: [name] Other Clinician: Referring Brissa Asante: [name] Treating Uchenna Seufert/Extender: [name], [name] Weeks in Treatment: 9 Encounter Discharge Information Items Discharge Condition: Stable Ambulatory Status: Walker Discharge Destination: Home Transportation: Private Auto Schedule Follow-up Appointment: Yes Clinical Summary of Care: Electronic Signature(s) Signed: 02/01/2018 5:44:59 PM By: Roger Shelter Entered By: Roger Shelter on 02/01/2018 09:18:12 [name], [name] ([id number]) -------------------------------------------------------------------------------- Lower Extremity Assessment Details Patient Name: [name] Date of Service: 02/01/2018 8:45 AM Medical Record Number: [id number] Patient Account Number: [id number] Date of Birth/Sex: [birth year] (67 y.o. M) Treating RN: Ahmed Prima Primary Care Tynesia Harral: [name] Other Clinician: Referring Bralyn Espino: [name] Treating Tahmir Kleckner/Extender: [name], [name] Weeks in Treatment: 9 Electronic Signature(s) Signed: 02/01/2018 5:41:08 PM By: Alric Quan Entered By: Alric Quan on 02/01/2018 08:47:51 Legan, [name] ([id number]) -------------------------------------------------------------------------------- Multi Wound Chart Details Patient Name: [name] Date of Service: 02/01/2018 8:45 AM Medical Record Number: [id number] Patient Account Number: [id number] Date of Birth/Sex: [birth year] (67 y.o. M) Treating RN: Roger Shelter Primary Care Mailin Coglianese: [name] Other Clinician: Referring Azariyah Luhrs: [name] Treating Seletha Zimmermann/Extender: [name], [name] Weeks in Treatment: 9 Vital Signs Height(in): 73 Pulse(bpm): 80 Weight(lbs): 210 Blood Pressure(mmHg): 119/76 Body Mass Index(BMI): 28 Temperature(F): 98.5 Respiratory  Rate 18 (breaths/min): Photos: [1:No Photos] [N/A:N/A] Wound Location: [1:Right Gluteal fold] [N/A:N/A] Wounding Event: [1:Pressure Injury] [N/A:N/A] Primary Etiology: [1:Pressure Ulcer] [N/A:N/A] Comorbid History: [1:Cataracts, Hypertension, Paraplegia] [N/A:N/A] Date Acquired: [1:11/02/2017] [N/A:N/A] Weeks of Treatment: [1:9] [N/A:N/A] Wound Status: [1:Open] [N/A:N/A] Measurements L x W x D [1:4.4x2.7x0.2] [N/A:N/A] (cm) Area (cm) : [1:9.331] [N/A:N/A] Volume (cm) : [1:1.866] [N/A:N/A] % Reduction in Area: [1:7.20%] [N/A:N/A] % Reduction in Volume: [1:-85.70%] [N/A:N/A] Classification: [1:Category/Stage III] [N/A:N/A] Exudate Amount: [1:Large] [N/A:N/A] Exudate Type: [1:Serous] [N/A:N/A] Exudate Color: [1:amber] [N/A:N/A] Wound Margin: [1:Flat and Intact] [N/A:N/A] Granulation Amount: [1:None  Present (0%)] [N/A:N/A] Necrotic Amount: [1:Large (67-100%)] [N/A:N/A] Exposed Structures: [1:Fat Layer (Subcutaneous Tissue) Exposed: Yes Fascia: No Tendon: No Muscle: No Joint: No Bone: No] [N/A:N/A] Epithelialization: [1:Small (1-33%)] [N/A:N/A] Periwound Skin Texture: [1:Excoriation: No Induration: No Callus: No Crepitus: No Rash: No Scarring: No] [N/A:N/A] Periwound Skin Moisture: [N/A:N/A] Maceration: Yes Dry/Scaly: No Periwound Skin Color: Atrophie Blanche: No N/A N/A Cyanosis: No Ecchymosis: No Erythema: No Hemosiderin Staining: No Mottled: No Pallor: No Rubor: No Temperature: No Abnormality N/A N/A Tenderness on Palpation: No N/A N/A Wound Preparation: Ulcer Cleansing: N/A N/A Rinsed/Irrigated with Saline Topical Anesthetic Applied: Other: lidocaine 4% Treatment Notes Electronic Signature(s) Signed: 02/01/2018 5:44:59 PM By: Roger Shelter Entered By: Roger Shelter on 02/01/2018 09:02:54 [name], [name] ([id number]) -------------------------------------------------------------------------------- [place] Details Patient Name:  [name] Date of Service: 02/01/2018 8:45 AM Medical Record Number: [id number] Patient Account Number: [id number] Date of Birth/Sex: 30-Oct-1950 (67 y.o. M) Treating RN: Roger Shelter Primary Care Renetta Suman: [name] Other Clinician: Referring [name]: [name] Treating Araseli Sherry/Extender: [name], [name] Weeks in Treatment: 9 Active Inactive ` Orientation to the Wound Care Program Nursing Diagnoses: Knowledge deficit related to the wound healing center program Goals: Patient/caregiver will verbalize understanding of the [facility] Program Date Initiated: 11/30/2017 Target Resolution Date: 12/21/2017 Goal Status: Active Interventions: Provide education on orientation to the wound center Notes: ` Wound/Skin Impairment Nursing Diagnoses: Impaired tissue integrity Goals: Patient/caregiver will verbalize understanding of skin care regimen Date Initiated: 11/30/2017 Target Resolution Date: 12/21/2017 Goal Status: Active Ulcer/skin breakdown will have a volume reduction of 30% by week 4 Date Initiated: 11/30/2017 Target Resolution Date: 12/21/2017 Goal Status: Active Interventions: Assess patient/caregiver ability to obtain necessary supplies Assess patient/caregiver ability to perform ulcer/skin care regimen upon admission and as needed Assess ulceration(s) every visit Treatment Activities: Skin care regimen initiated : 11/30/2017 Notes: Electronic Signature(s) Signed: 02/01/2018 5:44:59 PM By: Patrick North, [name] ([id number]) Entered By: Roger Shelter on 02/01/2018 09:05:24 [name], [name] ([id number]) -------------------------------------------------------------------------------- Pain Assessment Details Patient Name: [name] Date of Service: 02/01/2018 8:45 AM Medical Record Number: [id number] Patient Account Number: [id number] Date of Birth/Sex: 1951/03/06 (67 y.o. M) Treating RN: Ahmed Prima Primary  Care Shagun Wordell: [name] Other Clinician: Referring Donterrius Santucci: [name] Treating Saliyah Gillin/Extender: [name], [name] Weeks in Treatment: 9 Active Problems Location of Pain Severity and Description of Pain Patient Has Paino No Site Locations Pain Management and Medication Current Pain Management: Electronic Signature(s) Signed: 02/01/2018 5:41:08 PM By: Alric Quan Entered By: Alric Quan on 02/01/2018 08:46:17 [name] ([id number]) -------------------------------------------------------------------------------- Patient/Caregiver Education Details Patient Name: [name] Date of Service: 02/01/2018 8:45 AM Medical Record Number: [id number] Patient Account Number: [id number] Date of Birth/Gender: [birth year] (67 y.o. M) Treating RN: Roger Shelter Primary Care Physician: [name] Other Clinician: Referring Physician: [name] Treating Physician/Extender: [name] in Treatment: 9 Education Assessment Education Provided To: Patient and Caregiver Education Topics Provided Wound/Skin Impairment: Handouts: Caring for Your Ulcer Methods: Explain/Verbal Responses: State content correctly Electronic Signature(s) Signed: 02/01/2018 5:44:59 PM By: Roger Shelter Entered By: Roger Shelter on 02/01/2018 09:18:25 [name] ([id number]) -------------------------------------------------------------------------------- Wound Assessment Details Patient Name: [name] Date of Service: 02/01/2018 8:45 AM Medical Record Number: [id number] Patient Account Number: [id number] Date of Birth/Sex: 12-10-1950 (67 y.o. M) Treating RN: Ahmed Prima Primary Care Chue Berkovich: [name] Other Clinician: Referring Samit Sylve: [name] Treating Coren Crownover/Extender: [name], [name] Weeks in Treatment: 9 Wound Status Wound Number: 1 Primary Etiology: Pressure Ulcer Wound  Location: Right Gluteal  fold Wound Status: Open Wounding Event: Pressure Injury Comorbid History: Cataracts, Hypertension, Paraplegia Date Acquired: 11/02/2017 Weeks Of Treatment: 9 Clustered Wound: No Photos Photo Uploaded By: Alric Quan on 02/01/2018 11:21:39 Wound Measurements Length: (cm) 4.4 Width: (cm) 2.7 Depth: (cm) 0.2 Area: (cm) 9.331 Volume: (cm) 1.866 % Reduction in Area: 7.2% % Reduction in Volume: -85.7% Epithelialization: Small (1-33%) Tunneling: No Undermining: No Wound Description Classification: Category/Stage III Wound Margin: Flat and Intact Exudate Amount: Large Exudate Type: Serous Exudate Color: amber Foul Odor After Cleansing: No Slough/Fibrino Yes Wound Bed Granulation Amount: None Present (0%) Exposed Structure Necrotic Amount: Large (67-100%) Fascia Exposed: No Necrotic Quality: Adherent Slough Fat Layer (Subcutaneous Tissue) Exposed: Yes Tendon Exposed: No Muscle Exposed: No Joint Exposed: No Bone Exposed: No Periwound Skin Texture [name], [name] ([id number]) Texture Color No Abnormalities Noted: No No Abnormalities Noted: No Callus: No Atrophie Blanche: No Crepitus: No Cyanosis: No Excoriation: No Ecchymosis: No Induration: No Erythema: No Rash: No Hemosiderin Staining: No Scarring: No Mottled: No Pallor: No Moisture Rubor: No No Abnormalities Noted: No Dry / Scaly: No Temperature / Pain Maceration: Yes Temperature: No Abnormality Wound Preparation Ulcer Cleansing: Rinsed/Irrigated with Saline Topical Anesthetic Applied: Other: lidocaine 4%, Treatment Notes Wound #1 (Right Gluteal fold) 1. Cleansed with: Clean wound with Normal Saline 2. Anesthetic Topical Lidocaine 4% cream to wound bed prior to debridement 4. Dressing Applied: Prisma Ag 5. Secondary Dressing Applied Bordered Foam Dressing Notes place prisma on dry Electronic Signature(s) Signed: 02/01/2018 5:41:08 PM By: Alric Quan Entered By: Alric Quan  on 02/01/2018 08:51:25 [name], [name] ([id number]) -------------------------------------------------------------------------------- Vitals Details Patient Name: [name] Date of Service: 02/01/2018 8:45 AM Medical Record Number: [id number] Patient Account Number: [id number] Date of Birth/Sex: 1950-11-08 (67 y.o. M) Treating RN: Ahmed Prima Primary Care Shanice Poznanski: [name] Other Clinician: Referring Bethania Schlotzhauer: [name] Treating Vaeda Westall/Extender: [name], [name] Weeks in Treatment: 9 Vital Signs Time Taken: 08:46 Temperature (F): 98.5 Height (in): 73 Pulse (bpm): 80 Weight (lbs): 210 Respiratory Rate (breaths/min): 18 Body Mass Index (BMI): 27.7 Blood Pressure (mmHg): 119/76 Reference Range: 80 - 120 mg / dl Electronic Signature(s) Signed: 02/01/2018 5:41:08 PM By: Alric Quan Entered By: Alric Quan on 02/01/2018 08:46:36

## 2018-02-08 ENCOUNTER — Encounter: Payer: Medicare Other | Admitting: Physician Assistant

## 2018-02-08 DIAGNOSIS — L89313 Pressure ulcer of right buttock, stage 3: Secondary | ICD-10-CM | POA: Diagnosis not present

## 2018-02-09 NOTE — Progress Notes (Signed)
[name], [name] ([id number]) Visit Report for 02/08/2018 Arrival Information Details Patient Name: [name], [name] Date of Service: 02/08/2018 8:45 AM Medical Record Number: [id number] Patient Account Number: [id number] Date of Birth/Sex: 1950/07/26 (67 y.o. M) Treating RN: Secundino Ginger Primary Care Alliene Klugh: [name] Other Clinician: Referring Aleaya Latona: [name] Treating Rhys Lichty/Extender: [name], HOYT Weeks in Treatment: 10 Visit Information History Since Last Visit Added or deleted any medications: No Patient Arrived: Wheel Chair Any new allergies or adverse reactions: No Arrival Time: 09:02 Had a fall or experienced change in No Accompanied By: wife activities of daily living that may affect Transfer Assistance: Other risk of falls: Patient Requires Transmission-Based No Signs or symptoms of abuse/neglect since last visito No Precautions: Hospitalized since last visit: No Patient Has Alerts: Yes Implantable device outside of the clinic excluding No Patient Alerts: NOT cellular tissue based products placed in the center Diabetic since last visit: Has Dressing in Place as Prescribed: Yes Pain Present Now: No Electronic Signature(s) Signed: 02/08/2018 4:33:51 PM By: Secundino Ginger Entered By: Secundino Ginger on 02/08/2018 09:02:55 [name] ([id number]) -------------------------------------------------------------------------------- Encounter Discharge Information Details Patient Name: [name] Date of Service: 02/08/2018 8:45 AM Medical Record Number: [id number] Patient Account Number: [id number] Date of Birth/Sex: Oct 31, 1950 (67 y.o. M) Treating RN: Montey Hora Primary Care Genevie Elman: [name] Other Clinician: Referring Breanda Greenlaw: [name] Treating Glynis Hunsucker/Extender: [name], HOYT Weeks in Treatment: 10 Encounter Discharge Information Items Discharge Condition: Stable Ambulatory Status: Wheelchair Discharge  Destination: Home Transportation: Private Auto Accompanied By: spouse Schedule Follow-up Appointment: Yes Clinical Summary of Care: Electronic Signature(s) Signed: 02/08/2018 10:56:12 AM By: Montey Hora Entered By: Montey Hora on 02/08/2018 10:56:11 [name] ([id number]) -------------------------------------------------------------------------------- Lower Extremity Assessment Details Patient Name: [name] Date of Service: 02/08/2018 8:45 AM Medical Record Number: [id number] Patient Account Number: [id number] Date of Birth/Sex: 1950/11/22 (67 y.o. M) Treating RN: Secundino Ginger Primary Care Mariaha Ellington: [name] Other Clinician: Referring Raiyah Speakman: [name] Treating Taiwo Fish/Extender: [name], HOYT Weeks in Treatment: 10 Electronic Signature(s) Signed: 02/08/2018 4:33:51 PM By: Secundino Ginger Entered By: Secundino Ginger on 02/08/2018 09:13:58 [name], [name] ([id number]) -------------------------------------------------------------------------------- Multi Wound Chart Details Patient Name: [name] Date of Service: 02/08/2018 8:45 AM Medical Record Number: [id number] Patient Account Number: [id number] Date of Birth/Sex: 25-Dec-1950 (67 y.o. M) Treating RN: Roger Shelter Primary Care Charleene Callegari: [name] Other Clinician: Referring Nesreen Albano: [name] Treating Marvina Danner/Extender: [name], HOYT Weeks in Treatment: 10 Vital Signs Height(in): 73 Pulse(bpm): 20 Weight(lbs): 210 Blood Pressure(mmHg): 114/70 Body Mass Index(BMI): 28 Temperature(F): 98.7 Respiratory Rate 16 (breaths/min): Photos: [1:No Photos] [N/A:N/A] Wound Location: [1:Right Gluteal fold] [N/A:N/A] Wounding Event: [1:Pressure Injury] [N/A:N/A] Primary Etiology: [1:Pressure Ulcer] [N/A:N/A] Comorbid History: [1:Cataracts, Hypertension, Paraplegia] [N/A:N/A] Date Acquired: [1:11/02/2017] [N/A:N/A] Weeks of Treatment: [1:10] [N/A:N/A] Wound Status:  [1:Open] [N/A:N/A] Measurements L x W x D [1:4x2.7x0.2] [N/A:N/A] (cm) Area (cm) : [1:8.482] [N/A:N/A] Volume (cm) : [1:1.696] [N/A:N/A] % Reduction in Area: [1:15.60%] [N/A:N/A] % Reduction in Volume: [1:-68.80%] [N/A:N/A] Classification: [1:Category/Stage III] [N/A:N/A] Exudate Amount: [1:Large] [N/A:N/A] Exudate Type: [1:Serous] [N/A:N/A] Exudate Color: [1:amber] [N/A:N/A] Wound Margin: [1:Flat and Intact] [N/A:N/A] Granulation Amount: [1:None Present (0%)] [N/A:N/A] Necrotic Amount: [1:Large (67-100%)] [N/A:N/A] Exposed Structures: [1:Fat Layer (Subcutaneous Tissue) Exposed: Yes Fascia: No Tendon: No Muscle: No Joint: No Bone: No] [N/A:N/A] Epithelialization: [1:Small (1-33%)] [N/A:N/A] Periwound Skin Texture: [1:Excoriation: No Induration: No Callus: No Crepitus: No Rash: No Scarring: No] [N/A:N/A] Periwound Skin Moisture: [N/A:N/A] Maceration: Yes Dry/Scaly: No Periwound Skin Color: Atrophie Blanche: No N/A  N/A Cyanosis: No Ecchymosis: No Erythema: No Hemosiderin Staining: No Mottled: No Pallor: No Rubor: No Temperature: No Abnormality N/A N/A Tenderness on Palpation: No N/A N/A Wound Preparation: Ulcer Cleansing: N/A N/A Rinsed/Irrigated with Saline Topical Anesthetic Applied: Other: lidocaine 4% Treatment Notes Electronic Signature(s) Signed: 02/08/2018 5:25:05 PM By: Roger Shelter Entered By: Roger Shelter on 02/08/2018 09:36:41 [name], [name] ([id number]) -------------------------------------------------------------------------------- [place] Details Patient Name: [name] Date of Service: 02/08/2018 8:45 AM Medical Record Number: [id number] Patient Account Number: [id number] Date of Birth/Sex: Sep 07, 1950 (67 y.o. M) Treating RN: Roger Shelter Primary Care Kuron Docken: [name] Other Clinician: Referring Karly Pitter: [name] Treating Tayvia Faughnan/Extender: [name], HOYT Weeks in Treatment:  10 Active Inactive ` Orientation to the Wound Care Program Nursing Diagnoses: Knowledge deficit related to the wound healing center program Goals: Patient/caregiver will verbalize understanding of the [facility] Date Initiated: 11/30/2017 Target Resolution Date: 12/21/2017 Goal Status: Active Interventions: Provide education on orientation to the wound center Notes: ` Wound/Skin Impairment Nursing Diagnoses: Impaired tissue integrity Goals: Patient/caregiver will verbalize understanding of skin care regimen Date Initiated: 11/30/2017 Target Resolution Date: 12/21/2017 Goal Status: Active Ulcer/skin breakdown will have a volume reduction of 30% by week 4 Date Initiated: 11/30/2017 Target Resolution Date: 12/21/2017 Goal Status: Active Interventions: Assess patient/caregiver ability to obtain necessary supplies Assess patient/caregiver ability to perform ulcer/skin care regimen upon admission and as needed Assess ulceration(s) every visit Treatment Activities: Skin care regimen initiated : 11/30/2017 Notes: Electronic Signature(s) Signed: 02/08/2018 5:25:05 PM By: Patrick North, [name] ([id number]) Entered By: Roger Shelter on 02/08/2018 09:36:32 [name], [name] ([id number]) -------------------------------------------------------------------------------- Pain Assessment Details Patient Name: [name] Date of Service: 02/08/2018 8:45 AM Medical Record Number: [id number] Patient Account Number: [id number] Date of Birth/Sex: [birth year] (67 y.o. M) Treating RN: Secundino Ginger Primary Care Ethan Clayburn: [name] Other Clinician: Referring Caulin Begley: [name] Treating [name]: [name], HOYT Weeks in Treatment: 10 Active Problems Location of Pain Severity and Description of Pain Patient Has Paino No Site Locations Pain Management and Medication Current Pain Management: Goals for Pain Management Topical or  injectable lidocaine is offered to patient for acute pain when surgical debridement is performed. If needed, Patient is instructed to use over the counter pain medication for the following 24-48 hours after debridement. Wound care MDs do not prescribed pain medications. Patient has chronic pain or uncontrolled pain. Patient has been instructed to make an appointment with their Primary Care Physician for pain management. Electronic Signature(s) Signed: 02/08/2018 4:33:51 PM By: Secundino Ginger Entered By: Secundino Ginger on 02/08/2018 09:03:47 [name] ([id number]) -------------------------------------------------------------------------------- Patient/Caregiver Education Details Patient Name: [name] Date of Service: 02/08/2018 8:45 AM Medical Record Number: [id number] Patient Account Number: [id number] Date of Birth/Gender: February 04, 1951 (67 y.o. M) Treating RN: Montey Hora Primary Care Physician: [name] Other Clinician: Referring Physician: [name] Treating Physician/Extender: Sharalyn Ink in Treatment: 10 Education Assessment Education Provided To: Patient and Caregiver Education Topics Provided Wound/Skin Impairment: Handouts: Other: wound care if needed and SNAP vac usage Methods: Explain/Verbal Responses: State content correctly Electronic Signature(s) Signed: 02/08/2018 2:22:32 PM By: Montey Hora Entered By: Montey Hora on 02/08/2018 10:57:33 [name] ([id number]) -------------------------------------------------------------------------------- Wound Assessment Details Patient Name: [name] Date of Service: 02/08/2018 8:45 AM Medical Record Number: [id number] Patient Account Number: [id number] Date of Birth/Sex: 21-Jul-1951 (67 y.o. M) Treating RN: Secundino Ginger Primary Care Lilith Solana: [name] Other Clinician: Referring Kingslee Mairena: [name] Treating Braniyah Besse/Extender: [name], HOYT Weeks  in  Treatment: 10 Wound Status Wound Number: 1 Primary Etiology: Pressure Ulcer Wound Location: Right Gluteal fold Wound Status: Open Wounding Event: Pressure Injury Comorbid History: Cataracts, Hypertension, Paraplegia Date Acquired: 11/02/2017 Weeks Of Treatment: 10 Clustered Wound: No Photos Photo Uploaded By: Secundino Ginger on 02/08/2018 09:42:18 Wound Measurements Length: (cm) 4 Width: (cm) 2.7 Depth: (cm) 0.2 Area: (cm) 8.482 Volume: (cm) 1.696 % Reduction in Area: 15.6% % Reduction in Volume: -68.8% Epithelialization: Small (1-33%) Tunneling: No Undermining: No Wound Description Classification: Category/Stage III Foul Odo Wound Margin: Flat and Intact Slough/F Exudate Amount: Large Exudate Type: Serous Exudate Color: amber r After Cleansing: No ibrino Yes Wound Bed Granulation Amount: None Present (0%) Exposed Structure Necrotic Amount: Large (67-100%) Fascia Exposed: No Necrotic Quality: Adherent Slough Fat Layer (Subcutaneous Tissue) Exposed: Yes Tendon Exposed: No Muscle Exposed: No Joint Exposed: No Bone Exposed: No Periwound Skin Texture Texture Color No Abnormalities Noted: No No Abnormalities Noted: No Callus: No Atrophie Blanche: No [name], [name]. ([id number]) Crepitus: No Cyanosis: No Excoriation: No Ecchymosis: No Induration: No Erythema: No Rash: No Hemosiderin Staining: No Scarring: No Mottled: No Pallor: No Moisture Rubor: No No Abnormalities Noted: No Dry / Scaly: No Temperature / Pain Maceration: Yes Temperature: No Abnormality Wound Preparation Ulcer Cleansing: Rinsed/Irrigated with Saline Topical Anesthetic Applied: Other: lidocaine 4%, Treatment Notes Wound #1 (Right Gluteal fold) 1. Cleansed with: Clean wound with Normal Saline 2. Anesthetic Topical Lidocaine 4% cream to wound bed prior to debridement 4. Dressing Applied: Prisma Ag 7. Secured with Other (specify in notes) Notes SNAP vac applied in  clinic Electronic Signature(s) Signed: 02/08/2018 4:33:51 PM By: Secundino Ginger Entered By: Secundino Ginger on 02/08/2018 09:13:50 [name], [name] ([id number]) -------------------------------------------------------------------------------- Vitals Details Patient Name: [name] Date of Service: 02/08/2018 8:45 AM Medical Record Number: [id number] Patient Account Number: [id number] Date of Birth/Sex: [birth year] (67 y.o. M) Treating RN: Secundino Ginger Primary Care Duanna Runk: [name] Other Clinician: Referring Marino Rogerson: [name] Treating Turki Tapanes/Extender: [name], HOYT Weeks in Treatment: 10 Vital Signs Time Taken: 09:50 Temperature (F): 98.7 Height (in): 73 Pulse (bpm): 58 Weight (lbs): 210 Respiratory Rate (breaths/min): 16 Body Mass Index (BMI): 27.7 Blood Pressure (mmHg): 114/70 Reference Range: 80 - 120 mg / dl Electronic Signature(s) Signed: 02/08/2018 4:33:51 PM By: Secundino Ginger Entered By: Secundino Ginger on 02/08/2018 09:05:01

## 2018-02-09 NOTE — Progress Notes (Signed)
[name] ([id number]) Visit Report for 02/08/2018 Chief Complaint Document Details Patient Name: [name], [name] Date of Service: 02/08/2018 8:45 AM Medical Record Number: [id number] Patient Account Number: [id number] Date of Birth/Sex: [birth year] (67 y.o. M) Treating RN: [name] Primary Care Provider: Tracie [name] Other Clinician: Referring Provider: Tracie [name] Treating Provider/Extender: [name],  [name]: 10 Information Obtained from: Patient Chief Complaint Right gluteal fold ulcer Electronic Signature(s) [name]: 02/08/2018 11:53:17 PM [name]: Brett Keeler [name] [name]: [name] on 02/08/2018 08:53:04 [name], [name] ([id number]) -------------------------------------------------------------------------------- HPI Details Patient Name: [name] Date of Service: 02/08/2018 8:45 AM Medical Record Number: [id number] Patient Account Number: [id number] Date of Birth/Sex: 1950/12/09 (67 y.o. 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 He states that there is no history of diabetes that he is aware of. He does have issues with his prostate and is currently receiving [name] for this [name] way of oral medication. With that being said I do not have a lot of details in that regard. Nonetheless the patient presents today as a result of having been referred to [place] [name] another provider initially home health was set to come out and take care of his wound although due to the fact  that he apparently drives he's not able to receive home health. His wife is therefore trying to help take care of this wound within although they have been struggling with what exactly to do at this point. She states that she can do some things but she is definitely not a nurse and does have some issues with looking at blood. The good news is the wound does not appear to be too deep and is fairly superficial at this point. There is no slough noted there is some nonviable skin noted around the surface of the wound and the perimeter at this point. The central portion of the wound appears to be very good with a dermal layer noted this does not appear to be again deep enough to extend it to subcutaneous tissue at this point. 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 In the pump he has Dilaudid and baclofen. 12/07/17 on evaluation today patient presents for follow-up concerning his ongoing lower back thigh ulcer on the right. He states that he did not get the supplies ordered and therefore has not really been able to perform the dressing changes as directed exactly. His wife was able to get some Boarder Foam Dressing's from the drugstore and subsequently has been using hydrogel which did help to a degree in the wound does appear to be able smaller. There is actually more drainage this week noted than previous. 12/21/17 on evaluation today patient appears to be doing rather well in regard to his right gluteal ulcer. He has been tolerating the dressing changes without complication. There does not appear to be any evidence of infection at this point in time. Overall the wound does seem to be making some progress as far as the edges  are concerned there's not as much in the way of overlapping of the external wound edges and he has a good epithelium to wound bed border for the most part. This however is not  true right at the 12 o'clock location over the span of a little over a centimeters which actually will require debridement today to clean this away and hopefully allow it to continue to heal more appropriately. 12/28/17 on evaluation today patient appears to be doing rather well in regard to his ulcer in the left gluteal region. He's been tolerating the dressing changes without complication. Apparently he has had some difficulty getting his dressing material. Apparently there's been some confusion with ordering we're gonna check into this. Nonetheless overall he's been showing signs of improvement which is good news. Debridement is not required today. 01/04/18 on evaluation today patient presents for follow-up concerning his right gluteal ulcer. He has been tolerating the dressing changes fairly well. On inspection today it appears he may actually have some maceration them concerned about the fact that he may be developing too much moisture in and around the wound bed which can cause delay in healing. With that being said he unfortunately really has not showed significant signs of improvement since last week's evaluation in fact this may even be just the little bit/slightly larger. Nonetheless he's been having a lot of discomfort I'm not sure this is even related to the wound as he has no pain when I'm to breeding or otherwise cleaning the wound during evaluation today. Nonetheless this is something that we did recommend he talked to his pain specialist concerning. 01/11/18 on evaluation today patient appears to be doing better in regard to his ulceration. He has been tolerating the dressing changes without complication. With that being said overall there's no evidence of infection which is good news. The only thing is he did receive the hatch affair blue classic versus the ready nonetheless I feel like this is perfectly fine and appears to have done well for him over the past week. 01/25/18 on  evaluation today patient's wound actually appears to be a little bit larger than during the last evaluation. The good [name], [name] ([id number]) news is the majority of the wound edges actually appear to be fairly firmly attached to the wound bed unfortunately again we're not really making progress in regard to the size. Roughly the wound is about the same size as when I first saw him although again the wound margin/edges appear to be much better. 02/01/18 on evaluation today patient actually appears to be doing very well in regard to his wound. Applying the Prisma dry does seem to be better although he does still have issues with slow progression of the wound. There was a slight improvement compared to last week's measurements today. Nonetheless I have been considering other options as far as the possibility of Theraskin or even a snap vac. In general I'm not sure that the Theraskin due to location of the wound would be a very good idea. Nonetheless I do think that a snap vac could be a possibility for the patient and in fact I think this could even be an excellent way to manage the wound possibly seeing some improvement in a very rapid fashion here. Nonetheless this is something that we would need to get approved and I did have a lengthy conversation with the patient about this today. 02/08/18 on evaluation today patient appears to be doing a little better in regard to his ulcer.  He has been tolerating the dressing changes without complication. Fortunately despite the fact that the wound is a little bit smaller it's not significantly so unfortunately. We have discussed the possibility of a snap vac we did check with insurance this is actually covered at this point. Fortunately there does not appear to be any sign of infection. Overall I'm fairly pleased with how things seem to be appearing at this point. Electronic Signature(s) [name]: 02/08/2018 11:53:17 PM [name]: Brett Keeler [name] Brett  [name]: [name] on 02/08/2018 12:26:18 [name], [name] ([id number]) -------------------------------------------------------------------------------- Physical Exam Details Patient Name: [name] Date of Service: 02/08/2018 8:45 AM Medical Record Number: [id number] Patient Account Number: [id number] Date of Birth/Sex: 03-09-1951 (67 y.o. M) Treating RN: [name] Primary Care Provider: Tracie [name] Other Clinician: Referring Provider: Tracie [name] Treating Provider/Extender: [name],  [name]: 93 Constitutional Well-nourished and well-hydrated in no acute distress. Respiratory normal breathing without difficulty. clear to auscultation bilaterally. Cardiovascular regular rate and rhythm with normal S1, S2. Psychiatric this patient is able to make decisions and demonstrates good insight into disease process. Alert and Oriented x 3. pleasant and cooperative. Notes Patient's wound bed appears to be doing about the same although the good news is the edges of the wound have attached and do not appear to show any signs of Epiboly in general. Nonetheless I feel like he still is struggling to get this to improve as far as the overall size of the wound is concerned. He does look like he kept more pressure off of this week which I think is definitely good news and that I'm not seeing as much erythema surrounding the wound bed. Electronic Signature(s) [name]: 02/08/2018 11:53:17 PM [name]: Brett Keeler [name] [name]: [name] on 02/08/2018 12:30:03 [name], [name] ([id number]) -------------------------------------------------------------------------------- Physician Orders Details Patient Name: [name] Date of Service: 02/08/2018 8:45 AM Medical Record Number: [id number] Patient Account Number: [id number] Date of Birth/Sex: Feb 22, 1951 (67 y.o. 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 Anesthetic (add to Medication List) Wound #1 Right Gluteal fold o Topical Lidocaine 4% cream applied to wound bed prior to debridement (In Clinic Only). Skin Barriers/Peri-Wound Care Wound #1 Right Gluteal fold o Skin Prep Primary Wound Dressing o Silver Collagen Dressing Change Frequency Wound #1 Right Gluteal fold o Change dressing every week Follow-up Appointments Wound #1 Right Gluteal fold o Return Appointment in 1 week. Negative Pressure Wound Therapy Wound #1 Right Gluteal fold o Other: - snap vac applied in clinic Electronic Signature(s) [name]: 02/08/2018 5:25:05 PM [name]: [name] [name]: 02/08/2018 11:53:17 PM [name]: Brett Keeler [name] [name]: [name] on 02/08/2018 09:43:50 Glasscock, [name] ([id number]) -------------------------------------------------------------------------------- Problem List Details Patient Name: [name] Date of Service: 02/08/2018 8:45 AM Medical Record Number: [id number] Patient Account Number: [id number] Date of Birth/Sex: [birth year] (67 y.o. 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, stage 3  11/30/2017 No Yes S34.109A Unspecified injury to unspecified level of lumbar spinal cord, 11/30/2017 No Yes initial encounter [place] (primary) hypertension 11/30/2017 No Yes Inactive Problems Resolved Problems Electronic Signature(s) [name]: 02/08/2018 11:53:17 PM [name]: Brett Keeler [name] [name]: [name] on 02/08/2018 08:52:57 Wellman, [name] ([id number]) -------------------------------------------------------------------------------- Progress Note Details Patient Name: [name] Date of Service: 02/08/2018 8:45 AM Medical Record Number: [id number] Patient Account Number: [id number] Date of Birth/Sex: 1950/08/20 (67 y.o. 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 He states that there is no history of diabetes that he is aware of. He does have issues with his prostate and is currently receiving [name] for this [name] way of oral medication. With that being said I do not have a lot of details in that regard. Nonetheless the patient presents today as a result of having been referred to [place] [name] another provider initially home health was set to come out and take care of his wound although due to the fact that he apparently drives he's not able to receive home health. His wife is therefore trying to help take care of this wound within although they have been struggling with what exactly to do at this point. She states that she can do some things  but she is definitely not a nurse and does have some issues with looking at blood. The good news is the wound does not appear to be too deep and is fairly superficial at this point. There is no slough noted there is some nonviable skin noted around the surface of the wound and the perimeter at this point. The central portion of the wound appears to be very good with a dermal layer noted this does not appear to be again deep enough to extend it to subcutaneous tissue at this point. 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 In the pump he has Dilaudid and baclofen. 12/07/17 on evaluation today patient presents for follow-up concerning his ongoing lower back thigh ulcer on the right. He states that he did not get the supplies ordered and therefore has not really been able to perform the dressing changes as directed exactly. His wife was able to get some Boarder Foam Dressing's from the drugstore and subsequently has been using hydrogel which did help to a degree in the wound does appear to be able smaller. There is actually more drainage this week noted than previous. 12/21/17 on evaluation today patient appears to be doing rather well in regard to his right gluteal ulcer. He has been tolerating the dressing changes without complication. There does not appear to be any evidence of infection at this point in time. Overall the wound does seem to be making some progress as far as the edges are concerned there's not as much in the way of overlapping of the external wound edges and he has a  good epithelium to wound bed border for the most part. This however is not true right at the 12 o'clock location over the span of a little over a centimeters which actually will require debridement today to clean this away and hopefully allow it to continue to heal more appropriately. 12/28/17 on evaluation today  patient appears to be doing rather well in regard to his ulcer in the left gluteal region. He's been tolerating the dressing changes without complication. Apparently he has had some difficulty getting his dressing material. Apparently there's been some confusion with ordering we're gonna check into this. Nonetheless overall he's been showing signs of improvement which is good news. Debridement is not required today. 01/04/18 on evaluation today patient presents for follow-up concerning his right gluteal ulcer. He has been tolerating the dressing changes fairly well. On inspection today it appears he may actually have some maceration them concerned about the fact that he may be developing too much moisture in and around the wound bed which can cause delay in healing. With that being said he unfortunately really has not showed significant signs of improvement since last week's evaluation in fact this may even be just the little bit/slightly larger. Nonetheless he's been having a lot of discomfort I'm not sure this is even related to the wound as he has no pain when I'm to breeding or otherwise cleaning the wound during evaluation today. Nonetheless this is something that we did recommend he talked to his pain specialist concerning. 01/11/18 on evaluation today patient appears to be doing better in regard to his ulceration. He has been tolerating the dressing Keir, Yazid J. ([id number]) changes without complication. With that being said overall there's no evidence of infection which is good news. The only thing is he did receive the hatch affair blue classic versus the ready nonetheless I feel like this is perfectly fine and appears to have done well for him over the past week. 01/25/18 on evaluation today patient's wound actually appears to be a little bit larger than during the last evaluation. The good news is the majority of the wound edges actually appear to be fairly firmly attached to the wound  bed unfortunately again we're not really making progress in regard to the size. Roughly the wound is about the same size as when I first saw him although again the wound margin/edges appear to be much better. 02/01/18 on evaluation today patient actually appears to be doing very well in regard to his wound. Applying the Prisma dry does seem to be better although he does still have issues with slow progression of the wound. There was a slight improvement compared to last week's measurements today. Nonetheless I have been considering other options as far as the possibility of Theraskin or even a snap vac. In general I'm not sure that the Theraskin due to location of the wound would be a very good idea. Nonetheless I do think that a snap vac could be a possibility for the patient and in fact I think this could even be an excellent way to manage the wound possibly seeing some improvement in a very rapid fashion here. Nonetheless this is something that we would need to get approved and I did have a lengthy conversation with the patient about this today. 02/08/18 on evaluation today patient appears to be doing a little better in regard to his ulcer. He has been tolerating the dressing changes without complication. Fortunately despite the fact that the wound is a little bit  smaller it's not significantly so unfortunately. We have discussed the possibility of a snap vac we did check with insurance this is actually covered at this point. Fortunately there does not appear to be any sign of infection. Overall I'm fairly pleased with how things seem to be appearing at this point. Patient History Information obtained from Patient. Family History Hypertension - Father, Stroke - Mother, No family history of Cancer, Diabetes, Heart Disease, Kidney Disease, Lung Disease, Seizures, Thyroid Problems, Tuberculosis. Social History Never smoker, Marital Status - Married, Alcohol Use - Never, Drug Use - No History,  Caffeine Use - Daily. Medical And Surgical History Notes Oncologic Prostate cancer- currently treated with horomone therapy Review of Systems (ROS) Constitutional Symptoms (General Health) Denies complaints or symptoms of Fever, Chills. Respiratory The patient has no complaints or symptoms. Cardiovascular The patient has no complaints or symptoms. Psychiatric The patient has no complaints or symptoms. Objective [name], [name]. ([id number]) Constitutional Well-nourished and well-hydrated in no acute distress. Vitals Time Taken: 9:50 AM, Height: 73 in, Weight: 210 lbs, BMI: 27.7, Temperature: 98.7 F, Pulse: 58 bpm, Respiratory Rate: 16 breaths/min, Blood Pressure: 114/70 mmHg. Respiratory normal breathing without difficulty. clear to auscultation bilaterally. Cardiovascular regular rate and rhythm with normal S1, S2. Psychiatric this patient is able to make decisions and demonstrates good insight into disease process. Alert and Oriented x 3. pleasant and cooperative. General Notes: Patient's wound bed appears to be doing about the same although the good news is the edges of the wound have attached and do not appear to show any signs of Epiboly in general. Nonetheless I feel like he still is struggling to get this to improve as far as the overall size of the wound is concerned. He does look like he kept more pressure off of this week which I think is definitely good news and that I'm not seeing as much erythema surrounding the wound bed. Integumentary (Hair, Skin) Wound #1 status is Open. Original cause of wound was Pressure Injury. The wound is located on the Right Gluteal fold. The wound measures 4cm length x 2.7cm width x 0.2cm depth; 8.482cm^2 area and 1.696cm^3 volume. There is Fat Layer (Subcutaneous Tissue) Exposed exposed. There is no tunneling or undermining noted. There is a large amount of serous drainage noted. The wound margin is flat and intact. There is no  granulation within the wound bed. There is a large (67-100%) amount of necrotic tissue within the wound bed including Adherent Slough. The periwound skin appearance exhibited: Maceration. The periwound skin appearance did not exhibit: Callus, Crepitus, Excoriation, Induration, Rash, Scarring, Dry/Scaly, Atrophie Blanche, Cyanosis, Ecchymosis, Hemosiderin Staining, Mottled, Pallor, Rubor, Erythema. Periwound temperature was noted as No Abnormality. Assessment Active Problems ICD-10 Paraplegia, unspecified Pressure ulcer of right buttock, stage 3 Unspecified injury to unspecified level of lumbar spinal cord, initial encounter Essential (primary) hypertension Plan Wound Cleansing: Wound #1 Right Gluteal fold: Clean wound with Normal Saline. Anesthetic (add to Medication List): [name], [name] ([id number]) Wound #1 Right Gluteal fold: Topical Lidocaine 4% cream applied to wound bed prior to debridement (In Clinic Only). Skin Barriers/Peri-Wound Care: Wound #1 Right Gluteal fold: Skin Prep Primary Wound Dressing: Silver Collagen Dressing Change Frequency: Wound #1 Right Gluteal fold: Change dressing every week Follow-up Appointments: Wound #1 Right Gluteal fold: Return Appointment in 1 week. Negative Pressure Wound Therapy: Wound #1 Right Gluteal fold: Other: - snap vac applied in clinic At this time I'm gonna recommend that we give the snap vac a try over the  next week. Patient is in agreement with this plan and instructions were given as to how to care for this. We will subtly see him back for reevaluation to see were things stand next week. Please see above for specific wound care orders. We will see patient for re-evaluation in 1 week(s) here in the clinic. If anything worsens or changes patient will contact our office for additional recommendations. Electronic Signature(s) [name]: 02/08/2018 11:53:17 PM [name]: Brett Keeler [name] [name]: [name] on 02/08/2018  12:31:04 [name], [name] ([id number]) -------------------------------------------------------------------------------- ROS/PFSH Details Patient Name: [name] Date of Service: 02/08/2018 8:45 AM Medical Record Number: [id number] Patient Account Number: [id number] Date of Birth/Sex: [birth year] (67 y.o. M) Treating RN: [name] Primary Care Provider: Tracie [name] Other Clinician: Referring Provider: Tracie [name] Treating Provider/Extender: [name],  [name]: 10 Information Obtained From Patient Wound History Do you currently have one or more open woundso Yes How many open wounds do you currently haveo 1 Approximately how long have you had your woundso 1 month How have you been treating your wound(s) until nowo cream Has your wound(s) ever healed and then re-openedo No Have you had any lab work done in the past montho No Have you tested positive for an antibiotic resistant organism (MRSA, VRE)o No Have you tested positive for osteomyelitis (bone infection)o No Have you had any tests for circulation on your legso No Constitutional Symptoms (General Health) Complaints and Symptoms: Negative for: Fever; Chills Eyes Medical History: Positive for: Cataracts - both removed Negative for: Glaucoma; Optic Neuritis Ear/Nose/Mouth/Throat Medical History: Negative for: Chronic sinus problems/congestion; Middle ear problems Hematologic/Lymphatic Medical History: Negative for: Anemia; Hemophilia; Human Immunodeficiency Virus; Lymphedema Respiratory Complaints and Symptoms: No Complaints or Symptoms Medical History: Negative for: Aspiration; Asthma; Chronic Obstructive Pulmonary Disease (COPD); Pneumothorax; Sleep Apnea; Tuberculosis Cardiovascular Complaints and Symptoms: No Complaints or Symptoms Medical History: Positive for: Hypertension - takes medication [name], [name]. ([id number]) Negative for: Angina; Arrhythmia; Congestive  Heart Failure; Coronary Artery Disease; Deep Vein Thrombosis; Hypotension; Myocardial Infarction; Peripheral Arterial Disease; Peripheral Venous Disease; Phlebitis; Vasculitis Gastrointestinal Medical History: Negative for: Cirrhosis ; Colitis; Crohnos; Hepatitis A; Hepatitis B; Hepatitis C Endocrine Medical History: Negative for: Type I Diabetes; Type II Diabetes Genitourinary Medical History: Negative for: End Stage Renal Disease Immunological Medical History: Negative for: Lupus Erythematosus; Raynaudos; Scleroderma Integumentary (Skin) Medical History: Negative for: History of Burn; History of pressure wounds Musculoskeletal Medical History: Negative for: Gout; Rheumatoid Arthritis; Osteoarthritis; Osteomyelitis Neurologic Medical History: Positive for: Paraplegia - waist down Negative for: Dementia; Neuropathy; Quadriplegia; Seizure Disorder Oncologic Medical History: Negative for: Received Chemotherapy; Received Radiation Past Medical History Notes: Prostate cancer- currently treated with horomone therapy Psychiatric Complaints and Symptoms: No Complaints or Symptoms Medical History: Negative for: Anorexia/bulimia; Confinement Anxiety HBO Extended History Items Eyes: Cataracts Immunizations [name], [name] ([id number]) Pneumococcal Vaccine: Received Pneumococcal Vaccination: No Implantable Devices Family and Social History Cancer: No; Diabetes: No; Heart Disease: No; Hypertension: Yes - Father; Kidney Disease: No; Lung Disease: No; Seizures: No; Stroke: Yes - Mother; Thyroid Problems: No; Tuberculosis: No; Never smoker; Marital Status - Married; Alcohol Use: Never; Drug Use: No History; Caffeine Use: Daily; Advanced Directives: Yes (Copy provided); Patient does not want information on Advanced Directives; Living Will: Yes (Copy provided) Physician Affirmation I have reviewed and agree with the above information. Electronic Signature(s) [name]: 02/08/2018  5:25:05 PM [name]: [name] [name]: 02/08/2018 11:53:17 PM [name]: Brett Keeler [name] [name]: [name] on 02/08/2018 12:29:20  [name], [name] ([id number]) -------------------------------------------------------------------------------- SuperBill Details Patient Name: [name], [name] Date of Service: 02/08/2018 Medical Record Number: [id number] Patient Account Number: [id number] Date of Birth/Sex: 07-27-1950 (67 y.o. 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, stage 3 S34.109A Unspecified injury to unspecified level of lumbar spinal cord, initial encounter [place] (primary) hypertension Facility Procedures CPT4 Code: 40768088 Description: 11031 NEG PRESS WND TX <=50 SQ CM Modifier: Quantity: 1 Physician Procedures CPT4 Code Description: 5945859 29244 - WC PHYS LEVEL 3 - EST PT ICD-10 Diagnosis Description G82.20 Paraplegia, unspecified S34.109A Unspecified injury to unspecified level of lumbar spinal cord, 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

## 2018-02-15 ENCOUNTER — Encounter: Payer: Medicare Other | Admitting: Physician Assistant

## 2018-02-15 DIAGNOSIS — L89313 Pressure ulcer of right buttock, stage 3: Secondary | ICD-10-CM | POA: Diagnosis not present

## 2018-03-01 ENCOUNTER — Ambulatory Visit: Payer: Medicare Other | Admitting: Physician Assistant

## 2018-03-02 ENCOUNTER — Encounter: Payer: Medicare Other | Attending: Nurse Practitioner | Admitting: Nurse Practitioner

## 2018-03-02 DIAGNOSIS — I10 Essential (primary) hypertension: Secondary | ICD-10-CM | POA: Insufficient documentation

## 2018-03-02 DIAGNOSIS — G8220 Paraplegia, unspecified: Secondary | ICD-10-CM | POA: Diagnosis not present

## 2018-03-02 DIAGNOSIS — Z79890 Hormone replacement therapy (postmenopausal): Secondary | ICD-10-CM | POA: Insufficient documentation

## 2018-03-02 DIAGNOSIS — Z8546 Personal history of malignant neoplasm of prostate: Secondary | ICD-10-CM | POA: Insufficient documentation

## 2018-03-02 DIAGNOSIS — Z79899 Other long term (current) drug therapy: Secondary | ICD-10-CM | POA: Diagnosis not present

## 2018-03-02 DIAGNOSIS — L89313 Pressure ulcer of right buttock, stage 3: Secondary | ICD-10-CM | POA: Diagnosis not present

## 2018-03-02 DIAGNOSIS — Z79891 Long term (current) use of opiate analgesic: Secondary | ICD-10-CM | POA: Diagnosis not present

## 2018-03-02 DIAGNOSIS — Z969 Presence of functional implant, unspecified: Secondary | ICD-10-CM | POA: Insufficient documentation

## 2018-03-08 ENCOUNTER — Encounter: Payer: Medicare Other | Admitting: Physician Assistant

## 2018-03-08 DIAGNOSIS — L89313 Pressure ulcer of right buttock, stage 3: Secondary | ICD-10-CM | POA: Diagnosis not present

## 2018-03-14 NOTE — Progress Notes (Signed)
ARUL, FARABEE ([id number]) Visit Report for 03/02/2018 Arrival Information Details Patient Name: [name] Date of Service: 03/02/2018 8:00 AM Medical Record Number: [id number] Patient Account Number: [id number] Date of Birth/Sex: 1950/11/04 (67 y.o. M) Treating RN: Montey Hora Primary Care Mac Dowdell: [name] Other Clinician: Referring Tajh Livsey: [name] Treating Cecille Mcclusky/Extender: Cathie Olden in Treatment: 73 Visit Information History Since Last Visit Added or deleted any medications: No Patient Arrived: Wheel Chair Any new allergies or adverse reactions: No Arrival Time: 08:09 Had a fall or experienced change in No Accompanied By: self activities of daily living that may affect Transfer Assistance: None risk of falls: Patient Identification Verified: Yes Signs or symptoms of abuse/neglect since last visito No Secondary Verification Process Completed: Yes Hospitalized since last visit: No Patient Requires Transmission-Based No Implantable device outside of the clinic excluding No Precautions: cellular tissue based products placed in the center Patient Has Alerts: Yes since last visit: Patient Alerts: NOT Has Dressing in Place as Prescribed: Yes Diabetic Pain Present Now: No Electronic Signature(s) Signed: 03/02/2018 4:48:53 PM By: Montey Hora Entered By: Montey Hora on 03/02/2018 08:10:16 [name] ([id number]) -------------------------------------------------------------------------------- Lower Extremity Assessment Details Patient Name: [name] Date of Service: 03/02/2018 8:00 AM Medical Record Number: [id number] Patient Account Number: [id number] Date of Birth/Sex: 07/03/1951 (67 y.o. M) Treating RN: Montey Hora Primary Care Saba Gomm: [name] Other Clinician: Referring Gerrett Loman: [name] Treating Natalie Leclaire/Extender: Cathie Olden in Treatment: 13 Electronic  Signature(s) Signed: 03/02/2018 4:48:53 PM By: Montey Hora Entered By: Montey Hora on 03/02/2018 08:16:49 NITISH, ROES ([id number]) -------------------------------------------------------------------------------- Multi Wound Chart Details Patient Name: [name] Date of Service: 03/02/2018 8:00 AM Medical Record Number: [id number] Patient Account Number: [id number] Date of Birth/Sex: 17-Mar-1951 (67 y.o. M) Treating RN: [name] Primary Care Jozlyn Schatz: [name] Other Clinician: Referring Cady Hafen: [name] Treating Angeline Trick/Extender: Cathie Olden in Treatment: 13 Vital Signs Height(in): 73 Pulse(bpm): 72 Weight(lbs): 210 Blood Pressure(mmHg): 115/72 Body Mass Index(BMI): 28 Temperature(F): 98.3 Respiratory Rate 16 (breaths/min): Photos: [1:No Photos] [N/A:N/A] Wound Location: [1:Right Gluteal fold] [N/A:N/A] Wounding Event: [1:Pressure Injury] [N/A:N/A] Primary Etiology: [1:Pressure Ulcer] [N/A:N/A] Comorbid History: [1:Cataracts, Hypertension, Paraplegia] [N/A:N/A] Date Acquired: [1:11/02/2017] [N/A:N/A] Weeks of Treatment: [1:13] [N/A:N/A] Wound Status: [1:Open] [N/A:N/A] Measurements L x W x D [1:4.2x2.8x0.2] [N/A:N/A] (cm) Area (cm) : [1:9.236] [N/A:N/A] Volume (cm) : [1:1.847] [N/A:N/A] % Reduction in Area: [1:8.10%] [N/A:N/A] % Reduction in Volume: [1:-83.80%] [N/A:N/A] Classification: [1:Category/Stage III] [N/A:N/A] Exudate Amount: [1:Medium] [N/A:N/A] Exudate Type: [1:Serous] [N/A:N/A] Exudate Color: [1:amber] [N/A:N/A] Wound Margin: [1:Flat and Intact] [N/A:N/A] Granulation Amount: [1:Medium (34-66%)] [N/A:N/A] Granulation Quality: [1:Pink] [N/A:N/A] Necrotic Amount: [1:Medium (34-66%)] [N/A:N/A] Exposed Structures: [1:Fat Layer (Subcutaneous Tissue) Exposed: Yes Fascia: No Tendon: No Muscle: No Joint: No Bone: No] [N/A:N/A] Epithelialization: [1:None] [N/A:N/A] Debridement: [1:Debridement - Excisional]  [N/A:N/A] Pre-procedure [1:08:24] [N/A:N/A] Verification/Time Out Taken: Pain Control: [1:Other] [N/A:N/A] Tissue Debrided: [1:Subcutaneous] [N/A:N/A] Level: [1:Skin/Subcutaneous Tissue] [N/A:N/A] Debridement Area (sq cm): 11.76 N/A N/A Instrument: Curette N/A N/A Bleeding: Minimum N/A N/A Hemostasis Achieved: Pressure N/A N/A Procedural Pain: 0 N/A N/A Post Procedural Pain: 0 N/A N/A Debridement Treatment Procedure was tolerated well N/A N/A Response: Post Debridement 4.2x2.8x0.2 N/A N/A Measurements L x W x D (cm) Post Debridement Volume: 1.847 N/A N/A (cm) Post Debridement Stage: Category/Stage III N/A N/A Periwound Skin Texture: Excoriation: No N/A N/A Induration: No Callus: No Crepitus: No Rash: No Scarring: No Periwound Skin Moisture: Maceration: Yes N/A N/A Dry/Scaly: No Periwound Skin Color: Atrophie Blanche: No N/A N/A Cyanosis: No  Ecchymosis: No Erythema: No Hemosiderin Staining: No Mottled: No Pallor: No Rubor: No Temperature: No Abnormality N/A N/A Tenderness on Palpation: No N/A N/A Wound Preparation: Ulcer Cleansing: N/A N/A Rinsed/Irrigated with Saline Topical Anesthetic Applied: Other: lidocaine 4% Procedures Performed: Debridement N/A N/A Treatment Notes Electronic Signature(s) Signed: 03/02/2018 8:41:32 AM By: Lawanda Cousins Entered By: Lawanda Cousins on 03/02/2018 08:41:32 JEHU, MCCAUSLIN ([id number]) -------------------------------------------------------------------------------- [place] Details Patient Name: [name] Date of Service: 03/02/2018 8:00 AM Medical Record Number: [id number] Patient Account Number: [id number] Date of Birth/Sex: 01/14/1951 (67 y.o. M) Treating RN: [name] Primary Care Yandel Zeiner: [name] Other Clinician: Referring Chelise Hanger: [name] Treating Omarr Hann/Extender: Cathie Olden in Treatment: 13 Active Inactive ` Orientation to the Wound Care  Program Nursing Diagnoses: Knowledge deficit related to the wound healing center program Goals: Patient/caregiver will verbalize understanding of the [facility] Program Date Initiated: 11/30/2017 Target Resolution Date: 12/21/2017 Goal Status: Active Interventions: Provide education on orientation to the wound center Notes: ` Wound/Skin Impairment Nursing Diagnoses: Impaired tissue integrity Goals: Patient/caregiver will verbalize understanding of skin care regimen Date Initiated: 11/30/2017 Target Resolution Date: 12/21/2017 Goal Status: Active Ulcer/skin breakdown will have a volume reduction of 30% by week 4 Date Initiated: 11/30/2017 Target Resolution Date: 12/21/2017 Goal Status: Active Interventions: Assess patient/caregiver ability to obtain necessary supplies Assess patient/caregiver ability to perform ulcer/skin care regimen upon admission and as needed Assess ulceration(s) every visit Treatment Activities: Skin care regimen initiated : 11/30/2017 Notes: Electronic Signature(s) Signed: 03/02/2018 6:04:06 PM By: Gretta Cool, BSN, RN, CWS, Kim RN, BSN [address] [place] ([id number]) Entered By: Gretta Cool, BSN, RN, CWS, Kim on 03/02/2018 08:24:20 RHEN, KAWECKI ([id number]) -------------------------------------------------------------------------------- Pain Assessment Details Patient Name: [name] Date of Service: 03/02/2018 8:00 AM Medical Record Number: [id number] Patient Account Number: [id number] Date of Birth/Sex: [birth year] (67 y.o. M) Treating RN: Montey Hora Primary Care Makenah Karas: [name] Other Clinician: Referring Cheng Dec: [name] Treating Adham Johnson/Extender: Cathie Olden in Treatment: 13 Active Problems Location of Pain Severity and Description of Pain Patient Has Paino No Site Locations Pain Management and Medication Current Pain Management: Electronic Signature(s) Signed: 03/02/2018 4:48:53 PM By: Montey Hora Entered By: Montey Hora on 03/02/2018 08:11:03 [name] ([id number]) -------------------------------------------------------------------------------- Wound Assessment Details Patient Name: [name] Date of Service: 03/02/2018 8:00 AM Medical Record Number: [id number] Patient Account Number: [id number] Date of Birth/Sex: 02/02/1951 (67 y.o. M) Treating RN: Montey Hora Primary Care Mercy Leppla: [name] Other Clinician: Referring Jessicalynn Deshong: [name] Treating Tamikia Chowning/Extender: Cathie Olden in Treatment: 13 Wound Status Wound Number: 1 Primary Etiology: Pressure Ulcer Wound Location: Right Gluteal fold Wound Status: Open Wounding Event: Pressure Injury Comorbid History: Cataracts, Hypertension, Paraplegia Date Acquired: 11/02/2017 Weeks Of Treatment: 13 Clustered Wound: No Photos Photo Uploaded By: Montey Hora on 03/02/2018 10:59:34 Wound Measurements Length: (cm) 4.2 Width: (cm) 2.8 Depth: (cm) 0.2 Area: (cm) 9.236 Volume: (cm) 1.847 % Reduction in Area: 8.1% % Reduction in Volume: -83.8% Epithelialization: None Tunneling: No Undermining: No Wound Description Classification: Category/Stage III Wound Margin: Flat and Intact Exudate Amount: Medium Exudate Type: Serous Exudate Color: amber Foul Odor After Cleansing: No Slough/Fibrino Yes Wound Bed Granulation Amount: Medium (34-66%) Exposed Structure Granulation Quality: Pink Fascia Exposed: No Necrotic Amount: Medium (34-66%) Fat Layer (Subcutaneous Tissue) Exposed: Yes Necrotic Quality: Adherent Slough Tendon Exposed: No Muscle Exposed: No Joint Exposed: No Bone Exposed: No Periwound Skin Texture Lias, Mccrae J. ([id number]) Texture Color No Abnormalities Noted: No No Abnormalities Noted: No Callus: No  Atrophie Blanche: No Crepitus: No Cyanosis: No Excoriation: No Ecchymosis: No Induration: No Erythema: No Rash: No Hemosiderin Staining:  No Scarring: No Mottled: No Pallor: No Moisture Rubor: No No Abnormalities Noted: No Dry / Scaly: No Temperature / Pain Maceration: Yes Temperature: No Abnormality Wound Preparation Ulcer Cleansing: Rinsed/Irrigated with Saline Topical Anesthetic Applied: Other: lidocaine 4%, Electronic Signature(s) Signed: 03/02/2018 4:48:53 PM By: Montey Hora Entered By: Montey Hora on 03/02/2018 08:16:11 PERLIE, SCHEURING ([id number]) -------------------------------------------------------------------------------- Vitals Details Patient Name: [name] Date of Service: 03/02/2018 8:00 AM Medical Record Number: [id number] Patient Account Number: [id number] Date of Birth/Sex: [birth year] (67 y.o. M) Treating RN: Montey Hora Primary Care Jarae Panas: [name] Other Clinician: Referring Lexi Conaty: [name] Treating Michela Herst/Extender: Cathie Olden in Treatment: 13 Vital Signs Time Taken: 08:11 Temperature (F): 98.3 Height (in): 73 Pulse (bpm): 72 Weight (lbs): 210 Respiratory Rate (breaths/min): 16 Body Mass Index (BMI): 27.7 Blood Pressure (mmHg): 115/72 Reference Range: 80 - 120 mg / dl Electronic Signature(s) Signed: 03/02/2018 4:48:53 PM By: Montey Hora Entered By: Montey Hora on 03/02/2018 08:11:56

## 2018-03-15 ENCOUNTER — Encounter: Payer: Medicare Other | Admitting: Physician Assistant

## 2018-03-15 DIAGNOSIS — L89313 Pressure ulcer of right buttock, stage 3: Secondary | ICD-10-CM | POA: Diagnosis not present

## 2018-03-18 NOTE — Progress Notes (Signed)
SEARS, ORAN ([id number]) Visit Report for 03/08/2018 Arrival Information Details Patient Name: [name], [name] Date of Service: 03/08/2018 8:45 AM Medical Record Number: [id number] Patient Account Number: [id number] Date of Birth/Sex: 09-03-1950 (67 y.o. M) Treating RN: Ahmed Prima Primary Care Melvyn Hommes: [name] Other Clinician: Referring Aayana Reinertsen: [name] Treating Kerry Odonohue/Extender: [name], HOYT Weeks in Treatment: 14 Visit Information History Since Last Visit All ordered tests and consults were completed: No Patient Arrived: Wheel Chair Added or deleted any medications: No Arrival Time: 08:48 Any new allergies or adverse reactions: No Accompanied By: wife Had a fall or experienced change in No Transfer Assistance: Other activities of daily living that may affect Patient Identification Verified: Yes risk of falls: Secondary Verification Process Completed: Yes Signs or symptoms of abuse/neglect since last visito No Patient Requires Transmission-Based No Hospitalized since last visit: No Precautions: Implantable device outside of the clinic excluding No Patient Has Alerts: Yes cellular tissue based products placed in the center Patient Alerts: NOT since last visit: Diabetic Has Dressing in Place as Prescribed: Yes Pain Present Now: Yes Electronic Signature(s) Signed: 03/08/2018 5:34:19 PM By: Alric Quan Entered By: Alric Quan on 03/08/2018 08:48:37 [name] ([id number]) -------------------------------------------------------------------------------- Lower Extremity Assessment Details Patient Name: [name] Date of Service: 03/08/2018 8:45 AM Medical Record Number: [id number] Patient Account Number: [id number] Date of Birth/Sex: [birth year] (67 y.o. M) Treating RN: Ahmed Prima Primary Care Kimala Horne: [name] Other Clinician: Referring Severo Beber: [name] Treating Jalin Erpelding/Extender:  [name], HOYT Weeks in Treatment: 14 Electronic Signature(s) Signed: 03/08/2018 5:34:19 PM By: Alric Quan Entered By: Alric Quan on 03/08/2018 08:52:43 CALLAWAY, HAILES ([id number]) -------------------------------------------------------------------------------- Multi Wound Chart Details Patient Name: [name] Date of Service: 03/08/2018 8:45 AM Medical Record Number: [id number] Patient Account Number: [id number] Date of Birth/Sex: [birth year] (67 y.o. M) Treating RN: Roger Shelter Primary Care Audric Venn: [name] Other Clinician: Referring Chaney Maclaren: [name] Treating Nadim Malia/Extender: [name], HOYT Weeks in Treatment: 14 Vital Signs Height(in): 73 Pulse(bpm): 70 Weight(lbs): 210 Blood Pressure(mmHg): 140/84 Body Mass Index(BMI): 28 Temperature(F): 98.8 Respiratory Rate 16 (breaths/min): Photos: [1:No Photos] [N/A:N/A] Wound Location: [1:Right Gluteal fold] [N/A:N/A] Wounding Event: [1:Pressure Injury] [N/A:N/A] Primary Etiology: [1:Pressure Ulcer] [N/A:N/A] Comorbid History: [1:Cataracts, Hypertension, Paraplegia] [N/A:N/A] Date Acquired: [1:11/02/2017] [N/A:N/A] Weeks of Treatment: [1:14] [N/A:N/A] Wound Status: [1:Open] [N/A:N/A] Measurements L x W x D [1:4.5x3.4x0.2] [N/A:N/A] (cm) Area (cm) : [1:12.017] [N/A:N/A] Volume (cm) : [1:2.403] [N/A:N/A] % Reduction in Area: [1:-19.50%] [N/A:N/A] % Reduction in Volume: [1:-139.10%] [N/A:N/A] Classification: [1:Category/Stage III] [N/A:N/A] Exudate Amount: [1:Large] [N/A:N/A] Exudate Type: [1:Serous] [N/A:N/A] Exudate Color: [1:amber] [N/A:N/A] Wound Margin: [1:Flat and Intact] [N/A:N/A] Granulation Amount: [1:Large (67-100%)] [N/A:N/A] Granulation Quality: [1:Pink] [N/A:N/A] Necrotic Amount: [1:Small (1-33%)] [N/A:N/A] Exposed Structures: [1:Fat Layer (Subcutaneous Tissue) Exposed: Yes Fascia: No Tendon: No Muscle: No Joint: No Bone: No] [N/A:N/A] Epithelialization:  [1:None] [N/A:N/A] Periwound Skin Texture: [1:Excoriation: No Induration: No Callus: No Crepitus: No Rash: No Scarring: No] [N/A:N/A] Periwound Skin Moisture: Maceration: Yes N/A N/A Dry/Scaly: No Periwound Skin Color: Atrophie Blanche: No N/A N/A Cyanosis: No Ecchymosis: No Erythema: No Hemosiderin Staining: No Mottled: No Pallor: No Rubor: No Temperature: No Abnormality N/A N/A Tenderness on Palpation: No N/A N/A Wound Preparation: Ulcer Cleansing: N/A N/A Rinsed/Irrigated with Saline Topical Anesthetic Applied: Other: lidocaine 4% Treatment Notes Electronic Signature(s) Signed: 03/08/2018 5:29:48 PM By: Roger Shelter Entered By: Roger Shelter on 03/08/2018 09:31:45 MIKHAI, BIENVENUE ([id number]) -------------------------------------------------------------------------------- [place] Details Patient Name: [name] Date of Service: 03/08/2018 8:45 AM Medical Record  Number: [id number] Patient Account Number: [id number] Date of Birth/Sex: 02/24/1951 (67 y.o. M) Treating RN: Roger Shelter Primary Care Tasia Liz: [name] Other Clinician: Referring Paisly Fingerhut: [name] Treating Hayzlee Mcsorley/Extender: [name], HOYT Weeks in Treatment: 14 Active Inactive ` Orientation to the Wound Care Program Nursing Diagnoses: Knowledge deficit related to the wound healing center program Goals: Patient/caregiver will verbalize understanding of the [facility] Date Initiated: 11/30/2017 Target Resolution Date: 12/21/2017 Goal Status: Active Interventions: Provide education on orientation to the wound center Notes: ` Wound/Skin Impairment Nursing Diagnoses: Impaired tissue integrity Goals: Patient/caregiver will verbalize understanding of skin care regimen Date Initiated: 11/30/2017 Target Resolution Date: 12/21/2017 Goal Status: Active Ulcer/skin breakdown will have a volume reduction of 30% by week 4 Date  Initiated: 11/30/2017 Target Resolution Date: 12/21/2017 Goal Status: Active Interventions: Assess patient/caregiver ability to obtain necessary supplies Assess patient/caregiver ability to perform ulcer/skin care regimen upon admission and as needed Assess ulceration(s) every visit Treatment Activities: Skin care regimen initiated : 11/30/2017 Notes: Electronic Signature(s) Signed: 03/08/2018 5:29:48 PM By: Patrick North, [name] ([id number]) Entered By: Roger Shelter on 03/08/2018 09:31:38 ABDIRAHMAN, CHITTUM ([id number]) -------------------------------------------------------------------------------- Pain Assessment Details Patient Name: [name] Date of Service: 03/08/2018 8:45 AM Medical Record Number: [id number] Patient Account Number: [id number] Date of Birth/Sex: [birth year] (67 y.o. M) Treating RN: Ahmed Prima Primary Care Ken[place]ha Doster: [name] Other Clinician: Referring Elasha Tess: [name] Treating Tydarius Yawn/Extender: [name], HOYT Weeks in Treatment: 14 Active Problems Location of Pain Severity and Description of Pain Patient Has Paino Yes Site Locations Pain Location: Generalized Pain Rate the pain. Current Pain Level: 8 Character of Pain Describe the Pain: Aching Pain Management and Medication Current Pain Management: Notes all over pain Electronic Signature(s) Signed: 03/08/2018 5:34:19 PM By: Alric Quan Entered By: Alric Quan on 03/08/2018 08:48:56 Madariaga, [name] ([id number]) -------------------------------------------------------------------------------- Wound Assessment Details Patient Name: [name] Date of Service: 03/08/2018 8:45 AM Medical Record Number: [id number] Patient Account Number: [id number] Date of Birth/Sex: Mar 15, 1951 (67 y.o. M) Treating RN: Ahmed Prima Primary Care Eryn Marandola: [name] Other Clinician: Referring Sue Mcalexander: [name] Treating  Cobain Morici/Extender: [name], HOYT Weeks in Treatment: 14 Wound Status Wound Number: 1 Primary Etiology: Pressure Ulcer Wound Location: Right Gluteal fold Wound Status: Open Wounding Event: Pressure Injury Comorbid History: Cataracts, Hypertension, Paraplegia Date Acquired: 11/02/2017 Weeks Of Treatment: 14 Clustered Wound: No Photos Photo Uploaded By: Alric Quan on 03/08/2018 16:36:58 Wound Measurements Length: (cm) 4.5 Width: (cm) 3.4 Depth: (cm) 0.2 Area: (cm) 12.017 Volume: (cm) 2.403 % Reduction in Area: -19.5% % Reduction in Volume: -139.1% Epithelialization: None Tunneling: No Undermining: No Wound Description Classification: Category/Stage III Wound Margin: Flat and Intact Exudate Amount: Large Exudate Type: Serous Exudate Color: amber Foul Odor After Cleansing: No Slough/Fibrino Yes Wound Bed Granulation Amount: Large (67-100%) Exposed Structure Granulation Quality: Pink Fascia Exposed: No Necrotic Amount: Small (1-33%) Fat Layer (Subcutaneous Tissue) Exposed: Yes Necrotic Quality: Adherent Slough Tendon Exposed: No Muscle Exposed: No Joint Exposed: No Bone Exposed: No Periwound Skin Texture Blakeley, Izyan J. ([id number]) Texture Color No Abnormalities Noted: No No Abnormalities Noted: No Callus: No Atrophie Blanche: No Crepitus: No Cyanosis: No Excoriation: No Ecchymosis: No Induration: No Erythema: No Rash: No Hemosiderin Staining: No Scarring: No Mottled: No Pallor: No Moisture Rubor: No No Abnormalities Noted: No Dry / Scaly: No Temperature / Pain Maceration: Yes Temperature: No Abnormality Wound Preparation Ulcer Cleansing: Rinsed/Irrigated with Saline Topical Anesthetic Applied: Other: lidocaine 4%, Electronic Signature(s) Signed: 03/08/2018 5:34:19 PM By: Alric Quan  Entered By: Alric Quan on 03/08/2018 08:55:47 [name]  ([id number]) -------------------------------------------------------------------------------- Vitals Details Patient Name: [name] Date of Service: 03/08/2018 8:45 AM Medical Record Number: [id number] Patient Account Number: [id number] Date of Birth/Sex: [birth year] (67 y.o. M) Treating RN: Ahmed Prima Primary Care Elvert Cumpton: [name] Other Clinician: Referring Lucely Leard: [name] Treating Machel Violante/Extender: [name], HOYT Weeks in Treatment: 14 Vital Signs Time Taken: 08:48 Temperature (F): 98.8 Height (in): 73 Pulse (bpm): 70 Weight (lbs): 210 Respiratory Rate (breaths/min): 16 Body Mass Index (BMI): 27.7 Blood Pressure (mmHg): 140/84 Reference Range: 80 - 120 mg / dl Electronic Signature(s) Signed: 03/08/2018 5:34:19 PM By: Alric Quan Entered By: Alric Quan on 03/08/2018 08:51:02

## 2018-03-18 NOTE — Progress Notes (Signed)
[name] ([id number]) Visit Report for 03/02/2018 Chief Complaint Document Details Patient Name: [name], [name] Date of Service: 03/02/2018 8:00 AM Medical Record Number: [id number] Patient Account Number: [id number] Date of Birth/Sex: 1951/07/04 (67 y.o. M) Treating RN: Ahmed Prima Primary Care Provider: [name] Other Clinician: Referring Provider: [name] Treating Provider/Extender: Cathie Olden in Treatment: 13 Information Obtained from: Patient Chief Complaint Right gluteal fold ulcer Electronic Signature(s) Signed: 03/02/2018 8:42:04 AM By: Lawanda Cousins Entered By: Lawanda Cousins on 03/02/2018 08:42:04 [name], [name] ([id number]) -------------------------------------------------------------------------------- Debridement Details Patient Name: [name] Date of Service: 03/02/2018 8:00 AM Medical Record Number: [id number] Patient Account Number: [id number] Date of Birth/Sex: [birth year] (67 y.o. M) Treating RN: [name] Primary Care Provider: [name] Other Clinician: Referring Provider: [name] Treating Provider/Extender: Cathie Olden in Treatment: 13 Debridement Performed for Wound #1 Right Gluteal fold Assessment: Performed By: Physician Lawanda Cousins, NP Debridement Type: Debridement Pre-procedure Verification/Time Yes - 08:24 Out Taken: Start Time: 08:24 Pain Control: Other : lidocaine 4% Total Area Debrided (L x W): 4.2 (cm) x 2.8 (cm) = 11.76 (cm) Tissue and other material Viable, Non-Viable, Subcutaneous, Skin: Dermis debrided: Level: Skin/Subcutaneous Tissue Debridement Description: Excisional Instrument: Curette Bleeding: Minimum Hemostasis Achieved: Pressure End Time: 08:26 Procedural Pain: 0 Post Procedural Pain: 0 Response to Treatment: Procedure was tolerated well Level of Consciousness: Awake and Alert Post Debridement Measurements of Total Wound Length: (cm)  4.2 Stage: Category/Stage III Width: (cm) 2.8 Depth: (cm) 0.2 Volume: (cm) 1.847 Character of Wound/Ulcer Post Stable Debridement: Post Procedure Diagnosis Same as Pre-procedure Electronic Signature(s) Signed: 03/02/2018 5:29:48 PM By: Lawanda Cousins Signed: 03/02/2018 6:04:06 PM By: Gretta Cool, BSN, RN, CWS, Kim RN, BSN Entered By: Gretta Cool, BSN, RN, CWS, Kim on 03/02/2018 08:27:13 [name], [name] ([id number]) -------------------------------------------------------------------------------- HPI Details Patient Name: [name] Date of Service: 03/02/2018 8:00 AM Medical Record Number: [id number] Patient Account Number: [id number] Date of Birth/Sex: 1950-10-12 (67 y.o. 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 He states that there is no history of diabetes that he is aware of. He does have issues with his prostate and is currently receiving treatment for this by way of oral medication. With that being said I do not have a lot of details in that regard. Nonetheless the patient presents today as a result of having been referred to [place] by another provider initially home health was set to come out and take care of his wound although due to the fact that he apparently drives he's not able to receive home health. His wife is therefore trying to help take care of this wound within although they have been struggling with what exactly to do at this point. She states that she can do some things but she is definitely not a nurse and does have some  issues with looking at blood. The good news is the wound does not appear to be too deep and is fairly superficial at this point. There is no slough noted there is some nonviable skin noted around the surface of the wound and the perimeter at this point. The central  portion of the wound appears to be very good with a dermal layer noted this does not appear to be again deep enough to extend it to subcutaneous tissue at this point. 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 In the pump he has Dilaudid and baclofen. 12/07/17 on evaluation today patient presents for follow-up concerning his ongoing lower back thigh ulcer on the right. He states that he did not get the supplies ordered and therefore has not really been able to perform the dressing changes as directed exactly. His wife was able to get some Boarder Foam Dressing's from the drugstore and subsequently has been using hydrogel which did help to a degree in the wound does appear to be able smaller. There is actually more drainage this week noted than previous. 12/21/17 on evaluation today patient appears to be doing rather well in regard to his right gluteal ulcer. He has been tolerating the dressing changes without complication. There does not appear to be any evidence of infection at this point in time. Overall the wound does seem to be making some progress as far as the edges are concerned there's not as much in the way of overlapping of the external wound edges and he has a good epithelium to wound bed border for the most part. This however is not true right at the 12 o'clock location over the span of a little over a centimeters which actually will require debridement today to clean this away and hopefully allow it to continue to heal more appropriately. 12/28/17 on evaluation today patient appears to be doing rather well in regard to his  ulcer in the left gluteal region. He's been tolerating the dressing changes without complication. Apparently he has had some difficulty getting his dressing material. Apparently there's been some confusion with ordering we're gonna check into this. Nonetheless overall he's been showing signs of improvement which is good news. Debridement is not required today. 01/04/18 on evaluation today patient presents for follow-up concerning his right gluteal ulcer. He has been tolerating the dressing changes fairly well. On inspection today it appears he may actually have some maceration them concerned about the fact that he may be developing too much moisture in and around the wound bed which can cause delay in healing. With that being said he unfortunately really has not showed significant signs of improvement since last week's evaluation in fact this may even be just the little bit/slightly larger. Nonetheless he's been having a lot of discomfort I'm not sure this is even related to the wound as he has no pain when I'm to breeding or otherwise cleaning the wound during evaluation today. Nonetheless this is something that we did recommend he talked to his pain specialist concerning. 01/11/18 on evaluation today patient appears to be doing better in regard to his ulceration. He has been tolerating the dressing changes without complication. With that being said overall there's no evidence of infection which is good news. The only thing is he did receive the hatch affair blue classic versus the ready nonetheless I feel like this is perfectly fine and appears to have done well for him over the past week. 01/25/18 on evaluation today patient's wound actually appears to be a little bit larger than during the last evaluation. The good CARTIER, WASHKO. ([id number]) news is the majority of the wound edges actually appear to be fairly firmly attached to  the wound bed unfortunately again we're not really making  progress in regard to the size. Roughly the wound is about the same size as when I first saw him although again the wound margin/edges appear to be much better. 02/01/18 on evaluation today patient actually appears to be doing very well in regard to his wound. Applying the Prisma dry does seem to be better although he does still have issues with slow progression of the wound. There was a slight improvement compared to last week's measurements today. Nonetheless I have been considering other options as far as the possibility of Theraskin or even a snap vac. In general I'm not sure that the Theraskin due to location of the wound would be a very good idea. Nonetheless I do think that a snap vac could be a possibility for the patient and in fact I think this could even be an excellent way to manage the wound possibly seeing some improvement in a very rapid fashion here. Nonetheless this is something that we would need to get approved and I did have a lengthy conversation with the patient about this today. 02/08/18 on evaluation today patient appears to be doing a little better in regard to his ulcer. He has been tolerating the dressing changes without complication. Fortunately despite the fact that the wound is a little bit smaller it's not significantly so unfortunately. We have discussed the possibility of a snap vac we did check with insurance this is actually covered at this point. Fortunately there does not appear to be any sign of infection. Overall I'm fairly pleased with how things seem to be appearing at this point. 02/15/18 on evaluation today patient appears to be doing rather well in regard to his right gluteal ulcer. Unfortunately the snap vac did not stay in place with his sheer and friction this came loose and did not seem to maintain seal very well. He worked for about two days and it did seem to do very well during that time according to his wife but in general this does not seem to  be something that's gonna be beneficial for him long-term. I do believe we need to go back to standard dressings to see if we can find something that will be of benefit. 03/02/18- He is here in follow up evaluation; there is minimal change in the wound. He will continue with the same treatment plan, would consider changing to iodosrob/iodoflex if ulcer continues to to plateau. He will follow up next week Electronic Signature(s) Signed: 03/02/2018 8:43:45 AM By: Lawanda Cousins Entered By: Lawanda Cousins on 03/02/2018 08:43:44 [name], [name] ([id number]) -------------------------------------------------------------------------------- Physician Orders Details Patient Name: [name] Date of Service: 03/02/2018 8:00 AM Medical Record Number: [id number] Patient Account Number: [id number] Date of Birth/Sex: 1950-07-27 (67 y.o. M) Treating RN: [name] Primary Care Provider: [name] Other Clinician: Referring Provider: [name] Treating Provider/Extender: Cathie Olden in Treatment: 37 Verbal / Phone Orders: No Diagnosis Coding Wound Cleansing Wound #1 Right Gluteal fold o Clean wound with Normal Saline. Anesthetic (add to Medication List) Wound #1 Right Gluteal fold o Topical Lidocaine 4% cream applied to wound bed prior to debridement (In Clinic Only). Skin Barriers/Peri-Wound Care Wound #1 Right Gluteal fold o Skin Prep Primary Wound Dressing Wound #1 Right Gluteal fold o Other: - endoform Secondary Dressing Wound #1 Right Gluteal fold o Boardered Foam Dressing Dressing Change Frequency Wound #1 Right Gluteal fold o Other: - change every three days and as needed Follow-up Appointments  Wound #1 Right Gluteal fold o Return Appointment in 1 week. Electronic Signature(s) Signed: 03/02/2018 5:29:48 PM By: Lawanda Cousins Signed: 03/02/2018 6:04:06 PM By: Gretta Cool, BSN, RN, CWS, Kim RN, BSN Entered By: Gretta Cool, BSN, RN, CWS, Kim on  03/02/2018 08:29:08 [name], [name] ([id number]) -------------------------------------------------------------------------------- Problem List Details Patient Name: [name] Date of Service: 03/02/2018 8:00 AM Medical Record Number: [id number] Patient Account Number: [id number] Date of Birth/Sex: [birth year] (67 y.o. 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, 11/30/2017 No Yes sequela I10 Essential (primary) hypertension 11/30/2017 No Yes Inactive Problems Resolved Problems Electronic Signature(s) Signed: 03/02/2018 8:45:16 AM By: Lawanda Cousins Previous Signature: 03/02/2018 8:40:04 AM Version By: Lawanda Cousins Entered By: Lawanda Cousins on 03/02/2018 08:45:16 [name] ([id number]) -------------------------------------------------------------------------------- Progress Note Details Patient Name: [name] Date of Service: 03/02/2018 8:00 AM Medical Record Number: [id number] Patient Account Number: [id number] Date of Birth/Sex: 05-23-1951 (67 y.o. 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 He states that there is no history of diabetes that he is aware of. He does have issues with his prostate and is currently receiving treatment for this by way of oral medication. With that being said I do not have a lot of details in that regard. Nonetheless the patient presents today as a result of having been referred to [place] by another provider initially home health was set to come out and take care of his wound although due to the fact that he apparently drives he's not able to receive home health. His wife is therefore trying to help take care of this wound within although they have been struggling with what exactly to do at this point. She states that she can do some things but she is definitely not a nurse and does have some issues with looking at blood. The good news is the wound does not appear to be too deep and is fairly superficial at this point. There is no slough noted there is some nonviable skin noted around the surface of the wound and the perimeter at this point. The central portion of the wound appears to be very good with a dermal layer noted this does not appear to be again deep enough to extend it to subcutaneous tissue at this point. 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 In the pump he has Dilaudid and baclofen. 12/07/17 on evaluation today patient presents for follow-up concerning his ongoing lower back thigh ulcer on the right. He states that he did not get the supplies ordered and therefore has not really been able to perform the dressing changes as directed exactly. His wife was able  to get some Boarder Foam Dressing's from the drugstore and subsequently has been using hydrogel which did help to a degree in the wound does appear to be able smaller. There is actually more drainage this week  noted than previous. 12/21/17 on evaluation today patient appears to be doing rather well in regard to his right gluteal ulcer. He has been tolerating the dressing changes without complication. There does not appear to be any evidence of infection at this point in time. Overall the wound does seem to be making some progress as far as the edges are concerned there's not as much in the way of overlapping of the external wound edges and he has a good epithelium to wound bed border for the most part. This however is not true right at the 12 o'clock location over the span of a little over a centimeters which actually will require debridement today to clean this away and hopefully allow it to continue to heal more appropriately. 12/28/17 on evaluation today patient appears to be doing rather well in regard to his ulcer in the left gluteal region. He's been tolerating the dressing changes without complication. Apparently he has had some difficulty getting his dressing material. Apparently there's been some confusion with ordering we're gonna check into this. Nonetheless overall he's been showing signs of improvement which is good news. Debridement is not required today. 01/04/18 on evaluation today patient presents for follow-up concerning his right gluteal ulcer. He has been tolerating the dressing changes fairly well. On inspection today it appears he may actually have some maceration them concerned about the fact that he may be developing too much moisture in and around the wound bed which can cause delay in healing. With that being said he unfortunately really has not showed significant signs of improvement since last week's evaluation in fact this may even be just the little bit/slightly larger. Nonetheless he's been having a lot of discomfort I'm not sure this is even related to the wound as he has no pain when I'm to breeding or otherwise cleaning the wound during evaluation today. Nonetheless this is  something that we did recommend he talked to his pain specialist concerning. 01/11/18 on evaluation today patient appears to be doing better in regard to his ulceration. He has been tolerating the dressing [name], [name] ([id number]) changes without complication. With that being said overall there's no evidence of infection which is good news. The only thing is he did receive the hatch affair blue classic versus the ready nonetheless I feel like this is perfectly fine and appears to have done well for him over the past week. 01/25/18 on evaluation today patient's wound actually appears to be a little bit larger than during the last evaluation. The good news is the majority of the wound edges actually appear to be fairly firmly attached to the wound bed unfortunately again we're not really making progress in regard to the size. Roughly the wound is about the same size as when I first saw him although again the wound margin/edges appear to be much better. 02/01/18 on evaluation today patient actually appears to be doing very well in regard to his wound. Applying the Prisma dry does seem to be better although he does still have issues with slow progression of the wound. There was a slight improvement compared to last week's measurements today. Nonetheless I have been considering other options as far as the possibility of Theraskin or even a snap vac. In general I'm not sure  that the Theraskin due to location of the wound would be a very good idea. Nonetheless I do think that a snap vac could be a possibility for the patient and in fact I think this could even be an excellent way to manage the wound possibly seeing some improvement in a very rapid fashion here. Nonetheless this is something that we would need to get approved and I did have a lengthy conversation with the patient about this today. 02/08/18 on evaluation today patient appears to be doing a little better in regard to his ulcer. He has been  tolerating the dressing changes without complication. Fortunately despite the fact that the wound is a little bit smaller it's not significantly so unfortunately. We have discussed the possibility of a snap vac we did check with insurance this is actually covered at this point. Fortunately there does not appear to be any sign of infection. Overall I'm fairly pleased with how things seem to be appearing at this point. 02/15/18 on evaluation today patient appears to be doing rather well in regard to his right gluteal ulcer. Unfortunately the snap vac did not stay in place with his sheer and friction this came loose and did not seem to maintain seal very well. He worked for about two days and it did seem to do very well during that time according to his wife but in general this does not seem to be something that's gonna be beneficial for him long-term. I do believe we need to go back to standard dressings to see if we can find something that will be of benefit. 03/02/18- He is here in follow up evaluation; there is minimal change in the wound. He will continue with the same treatment plan, would consider changing to iodosrob/iodoflex if ulcer continues to to plateau. He will follow up next week Patient History Information obtained from Patient. Family History Hypertension - Father, Stroke - Mother, No family history of Cancer, Diabetes, Heart Disease, Kidney Disease, Lung Disease, Seizures, Thyroid Problems, Tuberculosis. Social History Never smoker, Marital Status - Married, Alcohol Use - Never, Drug Use - No History, Caffeine Use - Daily. Medical And Surgical History Notes Oncologic Prostate cancer- currently treated with horomone therapy Review of Systems (ROS) Constitutional Symptoms (General Health) Denies complaints or symptoms of Fatigue, Fever, Chills. Respiratory Denies complaints or symptoms of Shortness of Breath. Cardiovascular Denies complaints or symptoms of LE  edema. Gastrointestinal Denies complaints or symptoms of Nausea, Vomiting. [name], [name] ([id number]) Objective Constitutional Vitals Time Taken: 8:11 AM, Height: 73 in, Weight: 210 lbs, BMI: 27.7, Temperature: 98.3 F, Pulse: 72 bpm, Respiratory Rate: 16 breaths/min, Blood Pressure: 115/72 mmHg. Integumentary (Hair, Skin) Wound #1 status is Open. Original cause of wound was Pressure Injury. The wound is located on the Right Gluteal fold. The wound measures 4.2cm length x 2.8cm width x 0.2cm depth; 9.236cm^2 area and 1.847cm^3 volume. There is Fat Layer (Subcutaneous Tissue) Exposed exposed. There is no tunneling or undermining noted. There is a medium amount of serous drainage noted. The wound margin is flat and intact. There is medium (34-66%) pink granulation within the wound bed. There is a medium (34-66%) amount of necrotic tissue within the wound bed including Adherent Slough. The periwound skin appearance exhibited: Maceration. The periwound skin appearance did not exhibit: Callus, Crepitus, Excoriation, Induration, Rash, Scarring, Dry/Scaly, Atrophie Blanche, Cyanosis, Ecchymosis, Hemosiderin Staining, Mottled, Pallor, Rubor, Erythema. Periwound temperature was noted as No Abnormality. 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 There was a Excisional Skin/Subcutaneous Tissue Debridement with a total area of 11.76 sq cm performed by Lawanda Cousins, NP. With the following instrument(s): Curette to remove Viable and Non-Viable tissue/material. Material removed includes Subcutaneous Tissue and Skin: Dermis and after achieving pain control using Other (lidocaine 4%). No specimens were taken. A time out  was conducted at 08:24, prior to the start of the procedure. A Minimum amount of bleeding was controlled with Pressure. The procedure was tolerated well with a pain level of 0 throughout and a pain level of 0 following the procedure. Patient s Level of Consciousness post procedure was recorded as Awake and Alert. Post Debridement Measurements: 4.2cm length x 2.8cm width x 0.2cm depth; 1.847cm^3 volume. Post debridement Stage noted as Category/Stage III. Character of Wound/Ulcer Post Debridement is stable. Post procedure Diagnosis Wound #1: Same as Pre-Procedure [name], [name]. ([id number]) Plan Wound Cleansing: Wound #1 Right Gluteal fold: Clean wound with Normal Saline. Anesthetic (add to Medication List): Wound #1 Right Gluteal fold: Topical Lidocaine 4% cream applied to wound bed prior to debridement (In Clinic Only). Skin Barriers/Peri-Wound Care: Wound #1 Right Gluteal fold: Skin Prep Primary Wound Dressing: Wound #1 Right Gluteal fold: Other: - endoform Secondary Dressing: Wound #1 Right Gluteal fold: Boardered Foam Dressing Dressing Change Frequency: Wound #1 Right Gluteal fold: Other: - change every three days and as needed Follow-up Appointments: Wound #1 Right Gluteal fold: Return Appointment in 1 week. Electronic Signature(s) Signed: 03/02/2018 8:45:43 AM By: Lawanda Cousins Entered By: Lawanda Cousins on 03/02/2018 08:45:43 [name], [name] ([id number]) -------------------------------------------------------------------------------- ROS/PFSH Details Patient Name: [name] Date of Service: 03/02/2018 8:00 AM Medical Record Number: [id number] Patient Account Number: [id number] Date of Birth/Sex: [birth year] (67 y.o. M) Treating RN: Ahmed Prima Primary Care Provider: [name] Other Clinician: Referring Provider: [name] Treating Provider/Extender: Cathie Olden in Treatment: 13 Information Obtained From Patient Wound  History Do you currently have one or more open woundso Yes How many open wounds do you currently haveo 1 Approximately how long have you had your woundso 1 month How have you been treating your wound(s) until nowo cream Has your wound(s) ever healed and then re-openedo No Have you had any lab work done in the past montho No Have you tested positive for an antibiotic resistant organism (MRSA, VRE)o No Have you tested positive for osteomyelitis (bone infection)o No Have you had any tests for circulation on your legso No Constitutional Symptoms (General Health) Complaints and Symptoms: Negative for: Fatigue; Fever; Chills Respiratory Complaints and Symptoms: Negative for: Shortness of Breath Medical History: Negative for: Aspiration; Asthma; Chronic Obstructive Pulmonary Disease (COPD); Pneumothorax; Sleep Apnea; Tuberculosis Cardiovascular Complaints and Symptoms: Negative for: LE edema Medical History: Positive for: Hypertension - takes medication Negative for: Angina; Arrhythmia; Congestive Heart Failure; Coronary Artery Disease; Deep Vein Thrombosis; Hypotension; Myocardial Infarction; Peripheral Arterial Disease; Peripheral Venous Disease; Phlebitis; Vasculitis Gastrointestinal Complaints and Symptoms: Negative for: Nausea; Vomiting Medical History: Negative for: Cirrhosis ; Colitis; Crohnos; Hepatitis A; Hepatitis B; Hepatitis C Eyes Medical History: Positive for: Cataracts - both removed KDYN, VONBEHREN. ([id number]) Negative for: Glaucoma; Optic Neuritis Ear/Nose/Mouth/Throat Medical History: Negative for: Chronic sinus problems/congestion; Middle ear problems Hematologic/Lymphatic Medical History: Negative for: Anemia; Hemophilia; Human Immunodeficiency Virus; Lymphedema Endocrine Medical History: Negative for: Type I Diabetes; Type II Diabetes Genitourinary  Medical History: Negative for: End Stage Renal Disease Immunological Medical History: Negative for:  Lupus Erythematosus; Raynaudos; Scleroderma Integumentary (Skin) Medical History: Negative for: History of Burn; History of pressure wounds Musculoskeletal Medical History: Negative for: Gout; Rheumatoid Arthritis; Osteoarthritis; Osteomyelitis Neurologic Medical History: Positive for: Paraplegia - waist down Negative for: Dementia; Neuropathy; Quadriplegia; Seizure Disorder Oncologic Medical History: Negative for: Received Chemotherapy; Received Radiation Past Medical History Notes: Prostate cancer- currently treated with horomone therapy Psychiatric Medical History: Negative for: Anorexia/bulimia; Confinement Anxiety HBO Extended History Items Eyes: Cataracts MILON, DETHLOFF. ([id number]) Immunizations Pneumococcal Vaccine: Received Pneumococcal Vaccination: No Implantable Devices Family and Social History Cancer: No; Diabetes: No; Heart Disease: No; Hypertension: Yes - Father; Kidney Disease: No; Lung Disease: No; Seizures: No; Stroke: Yes - Mother; Thyroid Problems: No; Tuberculosis: No; Never smoker; Marital Status - Married; Alcohol Use: Never; Drug Use: No History; Caffeine Use: Daily; Advanced Directives: Yes (Copy provided); Patient does not want information on Advanced Directives; Living Will: Yes (Copy provided) Physician Affirmation I have reviewed and agree with the above information. Electronic Signature(s) Signed: 03/02/2018 5:29:48 PM By: Lawanda Cousins Signed: 03/08/2018 5:34:19 PM By: Alric Quan Entered By: Lawanda Cousins on 03/02/2018 08:44:19 KORREY, SCHLEICHER ([id number]) -------------------------------------------------------------------------------- SuperBill Details Patient Name: [name] Date of Service: 03/02/2018 Medical Record Number: [id number] Patient Account Number: [id number] Date of Birth/Sex: 1950/08/17 (67 y.o. 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

## 2018-03-19 NOTE — Progress Notes (Signed)
[name] ([id number]) Visit Report for 03/08/2018 Chief Complaint Document Details Patient Name: [name] Date of Service: 03/08/2018 8:45 AM Medical Record Number: [id number] Patient Account Number: [id number] Date of Birth/Sex: 1950/08/14 (67 y.o. M) Treating RN: Roger Shelter Primary Care Provider: [name] Other Clinician: Referring Provider: [name] Treating Provider/Extender: [name], HOYT Weeks in Treatment: 14 Information Obtained from: Patient Chief Complaint Right gluteal fold ulcer Electronic Signature(s) Signed: 03/09/2018 9:02:15 AM By: Worthy Keeler [name] Entered By: Worthy Keeler on 03/08/2018 09:27:11 [name] ([id number]) -------------------------------------------------------------------------------- Debridement Details Patient Name: [name] Date of Service: 03/08/2018 8:45 AM Medical Record Number: [id number] Patient Account Number: [id number] Date of Birth/Sex: 1950-07-29 (67 y.o. M) Treating RN: Roger Shelter Primary Care Provider: [name] Other Clinician: Referring Provider: [name] Treating Provider/Extender: [name], HOYT Weeks in Treatment: 14 Debridement Performed for Wound #1 Right Gluteal fold Assessment: Performed By: Physician [name], HOYT E., [name] Debridement Type: Debridement Pre-procedure Verification/Time Yes - 09:32 Out Taken: Start Time: 09:32 Pain Control: Other : lidocaine 4% Total Area Debrided (L x W): 4.5 (cm) x 3.4 (cm) = 15.3 (cm) Tissue and other material Viable, Non-Viable, Slough, Subcutaneous, Biofilm, Slough debrided: Level: Skin/Subcutaneous Tissue Debridement Description: Excisional Instrument: Curette Bleeding: Minimum Hemostasis Achieved: Pressure End Time: 09:35 Procedural Pain: 0 Post Procedural Pain: 0 Response to Treatment: Procedure was tolerated well Level of Consciousness: Awake and Alert Post Debridement  Measurements of Total Wound Length: (cm) 4.5 Stage: Category/Stage III Width: (cm) 3.4 Depth: (cm) 0.2 Volume: (cm) 2.403 Character of Wound/Ulcer Post Stable Debridement: Post Procedure Diagnosis Same as Pre-procedure Electronic Signature(s) Signed: 03/08/2018 5:29:48 PM By: Roger Shelter Signed: 03/09/2018 9:02:15 AM By: Worthy Keeler [name] Entered By: Roger Shelter on 03/08/2018 09:35:43 [name], [name] ([id number]) -------------------------------------------------------------------------------- HPI Details Patient Name: [name] Date of Service: 03/08/2018 8:45 AM Medical Record Number: [id number] Patient Account Number: [id number] Date of Birth/Sex: 03-21-1951 (67 y.o. 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 He states that there is no history of diabetes that he is aware of. He does have issues with his prostate and is currently receiving treatment for this by way of oral medication. With that being said I do not have a lot of details in that regard. Nonetheless the patient presents today as a result of having been referred to [place] by another provider initially home health was set to come out and take care of his wound although due to the fact that he apparently drives he's not able to receive home health. His wife is therefore trying to help take care of this wound within although they have been struggling with what exactly to do at this point. She states that she can do some things but she is definitely not  a nurse and does have some issues with looking at blood. The good news is the wound does not appear to be too deep and is fairly superficial at this point. There is no slough noted there is some nonviable skin noted around the surface of the wound and the perimeter at this  point. The central portion of the wound appears to be very good with a dermal layer noted this does not appear to be again deep enough to extend it to subcutaneous tissue at this point. 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 In the pump he has Dilaudid and baclofen. 12/07/17 on evaluation today patient presents for follow-up concerning his ongoing lower back thigh ulcer on the right. He states that he did not get the supplies ordered and therefore has not really been able to perform the dressing changes as directed exactly. His wife was able to get some Boarder Foam Dressing's from the drugstore and subsequently has been using hydrogel which did help to a degree in the wound does appear to be able smaller. There is actually more drainage this week noted than previous. 12/21/17 on evaluation today patient appears to be doing rather well in regard to his right gluteal ulcer. He has been tolerating the dressing changes without complication. There does not appear to be any evidence of infection at this point in time. Overall the wound does seem to be making some progress as far as the edges are concerned there's not as much in the way of overlapping of the external wound edges and he has a good epithelium to wound bed border for the most part. This however is not true right at the 12 o'clock location over the span of a little over a centimeters which actually will require debridement today to clean this away and hopefully allow it to continue to heal more appropriately. 12/28/17 on evaluation today patient appears to be doing  rather well in regard to his ulcer in the left gluteal region. He's been tolerating the dressing changes without complication. Apparently he has had some difficulty getting his dressing material. Apparently there's been some confusion with ordering we're gonna check into this. Nonetheless overall he's been showing signs of improvement which is good news. Debridement is not required today. 01/04/18 on evaluation today patient presents for follow-up concerning his right gluteal ulcer. He has been tolerating the dressing changes fairly well. On inspection today it appears he may actually have some maceration them concerned about the fact that he may be developing too much moisture in and around the wound bed which can cause delay in healing. With that being said he unfortunately really has not showed significant signs of improvement since last week's evaluation in fact this may even be just the little bit/slightly larger. Nonetheless he's been having a lot of discomfort I'm not sure this is even related to the wound as he has no pain when I'm to breeding or otherwise cleaning the wound during evaluation today. Nonetheless this is something that we did recommend he talked to his pain specialist concerning. 01/11/18 on evaluation today patient appears to be doing better in regard to his ulceration. He has been tolerating the dressing changes without complication. With that being said overall there's no evidence of infection which is good news. The only thing is he did receive the hatch affair blue classic versus the ready nonetheless I feel like this is perfectly fine and appears to have done well for him over the past week. 01/25/18 on evaluation today patient's wound actually appears to be a little bit larger than during the last evaluation. The good [name], DONATH. ([id number]) news is the majority of the wound edges actually appear to be fairly  firmly attached to the wound bed unfortunately  again we're not really making progress in regard to the size. Roughly the wound is about the same size as when I first saw him although again the wound margin/edges appear to be much better. 02/01/18 on evaluation today patient actually appears to be doing very well in regard to his wound. Applying the Prisma dry does seem to be better although he does still have issues with slow progression of the wound. There was a slight improvement compared to last week's measurements today. Nonetheless I have been considering other options as far as the possibility of Theraskin or even a snap vac. In general I'm not sure that the Theraskin due to location of the wound would be a very good idea. Nonetheless I do think that a snap vac could be a possibility for the patient and in fact I think this could even be an excellent way to manage the wound possibly seeing some improvement in a very rapid fashion here. Nonetheless this is something that we would need to get approved and I did have a lengthy conversation with the patient about this today. 02/08/18 on evaluation today patient appears to be doing a little better in regard to his ulcer. He has been tolerating the dressing changes without complication. Fortunately despite the fact that the wound is a little bit smaller it's not significantly so unfortunately. We have discussed the possibility of a snap vac we did check with insurance this is actually covered at this point. Fortunately there does not appear to be any sign of infection. Overall I'm fairly pleased with how things seem to be appearing at this point. 02/15/18 on evaluation today patient appears to be doing rather well in regard to his right gluteal ulcer. Unfortunately the snap vac did not stay in place with his sheer and friction this came loose and did not seem to maintain seal very well. He worked for about two days and it did seem to do very well during that time according to his wife but in  general this does not seem to be something that's gonna be beneficial for him long-term. I do believe we need to go back to standard dressings to see if we can find something that will be of benefit. 03/02/18- He is here in follow up evaluation; there is minimal change in the wound. He will continue with the same treatment plan, would consider changing to iodosrob/iodoflex if ulcer continues to to plateau. He will follow up next week 03/08/18 on evaluation today patient's wound actually appears to be about the same size as when I previously saw him several weeks back. Unfortunately he does have some slightly dark discoloration in the central portion of the wound which has me concerned about pressure injury. I do believe he may be sitting for too long a period of time in fact he tells me that "I probably sit for much too long". He does have some Slough noted on the surface of the wound and again as far as the size of the wound is concerned I'm really not seeing anything that seems to have improved significantly. Electronic Signature(s) Signed: 03/09/2018 9:02:15 AM By: Worthy Keeler [name] Entered By: Worthy Keeler on 03/09/2018 08:10:22 [name], [name] ([id number]) -------------------------------------------------------------------------------- Physical Exam Details Patient Name: [name] Date of Service: 03/08/2018 8:45 AM Medical Record Number: [id number] Patient Account Number: [id number] Date of Birth/Sex: July 05, 1951 (67 y.o. M) Treating RN: Roger Shelter Primary Care Provider: [name]  Other Clinician: Referring Provider: [name] Treating Provider/Extender: [name], HOYT Weeks in Treatment: 2 Constitutional Well-nourished and well-hydrated in no acute distress. Respiratory normal breathing without difficulty. Psychiatric this patient is able to make decisions and demonstrates good insight into disease process. Alert and Oriented x 3.  pleasant and cooperative. Notes Patient's wound bed did require sharp debridement today to remove the slough from the central portion of the wound. I was able to do this the discolored region still shows deeper discoloration although I do not believe the skin and tissue was actually breaking down I do believe it could end up breaking down if we are not very careful. I explained this to the patient today and we had a long discussion in this regard. I'm gonna suggest at this time that he again be very cautious about how long he stays in a seated position. Electronic Signature(s) Signed: 03/09/2018 9:02:15 AM By: Worthy Keeler [name] Entered By: Worthy Keeler on 03/09/2018 08:11:04 [name], [name] ([id number]) -------------------------------------------------------------------------------- Physician Orders Details Patient Name: [name] Date of Service: 03/08/2018 8:45 AM Medical Record Number: [id number] Patient Account Number: [id number] Date of Birth/Sex: [birth year] (67 y.o. 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 Anesthetic (add to Medication List) Wound #1 Right Gluteal fold o Topical Lidocaine 4% cream applied to wound bed prior to debridement (In Clinic Only). Skin Barriers/Peri-Wound Care Wound #1 Right Gluteal fold o Skin Prep Primary Wound Dressing Wound #1 Right Gluteal fold o Iodoflex Secondary Dressing Wound #1 Right Gluteal fold o Boardered  Foam Dressing Dressing Change Frequency Wound #1 Right Gluteal fold o Other: - change every three days and as needed Follow-up Appointments Wound #1 Right Gluteal fold o Return Appointment in 1 week. Off-Loading Wound #1 Right Gluteal fold o Roho cushion for wheelchair [name], [name] ([id number]) Electronic Signature(s) Signed: 03/08/2018 5:29:48 PM By: Roger Shelter Signed: 03/09/2018 9:02:15 AM By: Worthy Keeler [name] Entered By: Roger Shelter on 03/08/2018 09:38:33 CAMPBELL, KRAY ([id number]) -------------------------------------------------------------------------------- Problem List Details Patient Name: [name] Date of Service: 03/08/2018 8:45 AM Medical Record Number: [id number] Patient Account Number: [id number] Date of Birth/Sex: Oct 16, 1950 (67 y.o. 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, stage 3 11/30/2017 No Yes G82.20 Paraplegia, unspecified 11/30/2017 No Yes S34.109S Unspecified injury to unspecified level of lumbar spinal cord, 11/30/2017 No Yes sequela I10 Essential (primary) hypertension 11/30/2017 No Yes Inactive Problems Resolved Problems Electronic Signature(s) Signed: 03/09/2018 9:02:15 AM By: Worthy Keeler [name] Entered By: Worthy Keeler on 03/08/2018 09:26:49 FIDENCIO, DUDDY ([id number]) -------------------------------------------------------------------------------- Progress Note Details Patient Name: [name] Date of Service: 03/08/2018 8:45 AM Medical Record Number: [id number] Patient Account Number: [id number] Date of Birth/Sex: 07/13/1951 (67 y.o. 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 He states that there is no history of diabetes that he is aware of. He does have issues with his prostate and is currently receiving treatment for this by way of oral medication. With that being said I do not have a lot of details in that regard. Nonetheless the patient presents today as a result of having been referred to [place] by another provider initially home health was set to come out and take care of his wound although due to the fact that he apparently drives he's not able to receive home health. His wife is therefore trying to help take care of this wound within although they have been struggling with what exactly to do at this point. She states that she can do some things but she is definitely not a nurse and does have some issues with looking at blood. The good news is the wound does not appear to be too deep and is fairly superficial at this point. There is no slough noted there is some nonviable skin noted around the surface of the wound and the perimeter at this point. The central portion of the wound appears to be very good with a dermal layer noted this does not appear to be again deep enough to extend it to subcutaneous tissue at this point. 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 In the pump he has Dilaudid and baclofen. 12/07/17 on evaluation today patient presents for follow-up  concerning his ongoing lower back thigh ulcer on the right. He states that he did not get the supplies ordered and therefore has not really been able to perform the dressing changes as directed exactly. His wife was able to get some Boarder Foam Dressing's from the drugstore and subsequently has been using hydrogel which did help to a degree in the wound does appear to be able smaller. There is actually more drainage this week noted than previous. 12/21/17 on evaluation today patient appears to be doing rather well in regard to his right gluteal ulcer. He has been tolerating the dressing changes without complication. There does not appear to be any evidence of infection at this point in time. Overall the wound does seem to be making some progress as far as the edges are concerned there's not as much in the way of overlapping of the external wound edges and he has a good epithelium to wound bed border for the most part. This however is not true right at the 12 o'clock location over the span of a little over a centimeters which actually will require debridement today to clean this away and hopefully allow it to continue to heal more appropriately. 12/28/17 on evaluation today patient appears to be doing rather well in regard to his ulcer in the left gluteal region. He's been tolerating the dressing changes without complication. Apparently he has had some difficulty getting his dressing material. Apparently there's been some confusion with ordering we're gonna check into this. Nonetheless overall he's been showing signs of improvement which is good news. Debridement is not required today. 01/04/18 on evaluation today patient  presents for follow-up concerning his right gluteal ulcer. He has been tolerating the dressing changes fairly well. On inspection today it appears he may actually have some maceration them concerned about the fact that he may be developing too much moisture in and around the wound bed  which can cause delay in healing. With that being said he unfortunately really has not showed significant signs of improvement since last week's evaluation in fact this may even be just the little bit/slightly larger. Nonetheless he's been having a lot of discomfort I'm not sure this is even related to the wound as he has no pain when I'm to breeding or otherwise cleaning the wound during evaluation today. Nonetheless this is something that we did recommend he talked to his pain specialist concerning. 01/11/18 on evaluation today patient appears to be doing better in regard to his ulceration. He has been tolerating the dressing Schindler, Mauricio J. ([id number]) changes without complication. With that being said overall there's no evidence of infection which is good news. The only thing is he did receive the hatch affair blue classic versus the ready nonetheless I feel like this is perfectly fine and appears to have done well for him over the past week. 01/25/18 on evaluation today patient's wound actually appears to be a little bit larger than during the last evaluation. The good news is the majority of the wound edges actually appear to be fairly firmly attached to the wound bed unfortunately again we're not really making progress in regard to the size. Roughly the wound is about the same size as when I first saw him although again the wound margin/edges appear to be much better. 02/01/18 on evaluation today patient actually appears to be doing very well in regard to his wound. Applying the Prisma dry does seem to be better although he does still have issues with slow progression of the wound. There was a slight improvement compared to last week's measurements today. Nonetheless I have been considering other options as far as the possibility of Theraskin or even a snap vac. In general I'm not sure that the Theraskin due to location of the wound would be a very good idea. Nonetheless I do think that a  snap vac could be a possibility for the patient and in fact I think this could even be an excellent way to manage the wound possibly seeing some improvement in a very rapid fashion here. Nonetheless this is something that we would need to get approved and I did have a lengthy conversation with the patient about this today. 02/08/18 on evaluation today patient appears to be doing a little better in regard to his ulcer. He has been tolerating the dressing changes without complication. Fortunately despite the fact that the wound is a little bit smaller it's not significantly so unfortunately. We have discussed the possibility of a snap vac we did check with insurance this is actually covered at this point. Fortunately there does not appear to be any sign of infection. Overall I'm fairly pleased with how things seem to be appearing at this point. 02/15/18 on evaluation today patient appears to be doing rather well in regard to his right gluteal ulcer. Unfortunately the snap vac did not stay in place with his sheer and friction this came loose and did not seem to maintain seal very well. He worked for about two days and it did seem to do very well during that time according to his wife but in general this does not  seem to be something that's gonna be beneficial for him long-term. I do believe we need to go back to standard dressings to see if we can find something that will be of benefit. 03/02/18- He is here in follow up evaluation; there is minimal change in the wound. He will continue with the same treatment plan, would consider changing to iodosrob/iodoflex if ulcer continues to to plateau. He will follow up next week 03/08/18 on evaluation today patient's wound actually appears to be about the same size as when I previously saw him several weeks back. Unfortunately he does have some slightly dark discoloration in the central portion of the wound which has me concerned about pressure injury. I do believe  he may be sitting for too long a period of time in fact he tells me that "I probably sit for much too long". He does have some Slough noted on the surface of the wound and again as far as the size of the wound is concerned I'm really not seeing anything that seems to have improved significantly. Patient History Information obtained from Patient. Family History Hypertension - Father, Stroke - Mother, No family history of Cancer, Diabetes, Heart Disease, Kidney Disease, Lung Disease, Seizures, Thyroid Problems, Tuberculosis. Social History Never smoker, Marital Status - Married, Alcohol Use - Never, Drug Use - No History, Caffeine Use - Daily. Medical And Surgical History Notes Oncologic Prostate cancer- currently treated with horomone therapy Review of Systems (ROS) Constitutional Symptoms (General Health) Denies complaints or symptoms of Fever, Chills. Respiratory The patient has no complaints or symptoms. Cardiovascular [name], [name] ([id number]) The patient has no complaints or symptoms. Psychiatric The patient has no complaints or symptoms. Objective Constitutional Well-nourished and well-hydrated in no acute distress. Vitals Time Taken: 8:48 AM, Height: 73 in, Weight: 210 lbs, BMI: 27.7, Temperature: 98.8 F, Pulse: 70 bpm, Respiratory Rate: 16 breaths/min, Blood Pressure: 140/84 mmHg. Respiratory normal breathing without difficulty. Psychiatric this patient is able to make decisions and demonstrates good insight into disease process. Alert and Oriented x 3. pleasant and cooperative. General Notes: Patient's wound bed did require sharp debridement today to remove the slough from the central portion of the wound. I was able to do this the discolored region still shows deeper discoloration although I do not believe the skin and tissue was actually breaking down I do believe it could end up breaking down if we are not very careful. I explained this to the patient today  and we had a long discussion in this regard. I'm gonna suggest at this time that he again be very cautious about how long he stays in a seated position. Integumentary (Hair, Skin) Wound #1 status is Open. Original cause of wound was Pressure Injury. The wound is located on the Right Gluteal fold. The wound measures 4.5cm length x 3.4cm width x 0.2cm depth; 12.017cm^2 area and 2.403cm^3 volume. There is Fat Layer (Subcutaneous Tissue) Exposed exposed. There is no tunneling or undermining noted. There is a large amount of serous drainage noted. The wound margin is flat and intact. There is large (67-100%) pink granulation within the wound bed. There is a small (1-33%) amount of necrotic tissue within the wound bed including Adherent Slough. The periwound skin appearance exhibited: Maceration. The periwound skin appearance did not exhibit: Callus, Crepitus, Excoriation, Induration, Rash, Scarring, Dry/Scaly, Atrophie Blanche, Cyanosis, Ecchymosis, Hemosiderin Staining, Mottled, Pallor, Rubor, Erythema. Periwound temperature was noted as No Abnormality. 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 ([id number]) Procedures Wound #1 Pre-procedure diagnosis of Wound #1 is a Pressure Ulcer located on the Right Gluteal fold . There was a Excisional Skin/Subcutaneous Tissue Debridement with a total area of 15.3 sq cm performed by [name], HOYT E., [name]. With the following instrument(s): Curette to remove Viable and Non-Viable tissue/material. Material removed includes Subcutaneous Tissue, Slough, and Biofilm after achieving pain control using Other (lidocaine 4%). No specimens were taken. A time out was conducted at 09:32, prior to the start of the procedure. A Minimum amount of bleeding was controlled with Pressure.  The procedure was tolerated well with a pain level of 0 throughout and a pain level of 0 following the procedure. Patient s Level of Consciousness post procedure was recorded as Awake and Alert. Post Debridement Measurements: 4.5cm length x 3.4cm width x 0.2cm depth; 2.403cm^3 volume. Post debridement Stage noted as Category/Stage III. Character of Wound/Ulcer Post Debridement is stable. Post procedure Diagnosis Wound #1: Same as Pre-Procedure Plan Wound Cleansing: Wound #1 Right Gluteal fold: Clean wound with Normal Saline. Anesthetic (add to Medication List): Wound #1 Right Gluteal fold: Topical Lidocaine 4% cream applied to wound bed prior to debridement (In Clinic Only). Skin Barriers/Peri-Wound Care: Wound #1 Right Gluteal fold: Skin Prep Primary Wound Dressing: Wound #1 Right Gluteal fold: Iodoflex Secondary Dressing: Wound #1 Right Gluteal fold: Boardered Foam Dressing Dressing Change Frequency: Wound #1 Right Gluteal fold: Other: - change every three days and as needed Follow-up Appointments: Wound #1 Right Gluteal fold: Return Appointment in 1 week. Off-Loading: Wound #1 Right Gluteal fold: Roho cushion for wheelchair I did also recommend that we go ahead and see about getting him not only a Roho cushion for his chair but potentially a better schedule for offloading in order to avoid too much pressure to the affected region. He understands. With that being said [name], [name] ([id number]) if we're not careful this could open up until much deeper wound which I am trying to avoid. I would like to see him back for a reevaluation in one week. Please see above for specific wound care orders. We will see patient for re-evaluation in 1 week(s) here in the clinic. If anything worsens or changes patient will contact our office for additional recommendations. Electronic Signature(s) Signed: 03/09/2018 9:02:15 AM By: Worthy Keeler [name] Entered By: Worthy Keeler on  03/09/2018 08:11:24 [name], [name] ([id number]) -------------------------------------------------------------------------------- ROS/PFSH Details Patient Name: [name] Date of Service: 03/08/2018 8:45 AM Medical Record Number: [id number] Patient Account Number: [id number] Date of Birth/Sex: 05/03/1951 (67 y.o. M) Treating RN: Roger Shelter Primary Care Provider: [name] Other Clinician: Referring Provider: [name] Treating Provider/Extender: [name], HOYT Weeks in Treatment: 14 Information Obtained From Patient Wound History Do you currently have one or more open woundso Yes How many open wounds do you currently haveo 1 Approximately how long have you had your woundso 1 month How have you been treating your wound(s) until nowo cream Has your wound(s) ever healed and then re-openedo No Have you had any lab work done in the past montho No Have you tested positive for an antibiotic resistant organism (MRSA, VRE)o No Have you tested positive for osteomyelitis (bone infection)o No Have you had any tests for circulation on your legso No Constitutional Symptoms (General Health) Complaints and Symptoms: Negative for: Fever; Chills Eyes Medical History: Positive for: Cataracts - both removed Negative for: Glaucoma; Optic  Neuritis Ear/Nose/Mouth/Throat Medical History: Negative for: Chronic sinus problems/congestion; Middle ear problems Hematologic/Lymphatic Medical History: Negative for: Anemia; Hemophilia; Human Immunodeficiency Virus; Lymphedema Respiratory Complaints and Symptoms: No Complaints or Symptoms Medical History: Negative for: Aspiration; Asthma; Chronic Obstructive Pulmonary Disease (COPD); Pneumothorax; Sleep Apnea; Tuberculosis Cardiovascular Complaints and Symptoms: No Complaints or Symptoms Medical History: Positive for: Hypertension - takes medication [name], MCCAMY. ([id number]) Negative for: Angina; Arrhythmia;  Congestive Heart Failure; Coronary Artery Disease; Deep Vein Thrombosis; Hypotension; Myocardial Infarction; Peripheral Arterial Disease; Peripheral Venous Disease; Phlebitis; Vasculitis Gastrointestinal Medical History: Negative for: Cirrhosis ; Colitis; Crohnos; Hepatitis A; Hepatitis B; Hepatitis C Endocrine Medical History: Negative for: Type I Diabetes; Type II Diabetes Genitourinary Medical History: Negative for: End Stage Renal Disease Immunological Medical History: Negative for: Lupus Erythematosus; Raynaudos; Scleroderma Integumentary (Skin) Medical History: Negative for: History of Burn; History of pressure wounds Musculoskeletal Medical History: Negative for: Gout; Rheumatoid Arthritis; Osteoarthritis; Osteomyelitis Neurologic Medical History: Positive for: Paraplegia - waist down Negative for: Dementia; Neuropathy; Quadriplegia; Seizure Disorder Oncologic Medical History: Negative for: Received Chemotherapy; Received Radiation Past Medical History Notes: Prostate cancer- currently treated with horomone therapy Psychiatric Complaints and Symptoms: No Complaints or Symptoms Medical History: Negative for: Anorexia/bulimia; Confinement Anxiety HBO Extended History Items Eyes: Cataracts Immunizations [name], [name] ([id number]) Pneumococcal Vaccine: Received Pneumococcal Vaccination: No Implantable Devices Family and Social History Cancer: No; Diabetes: No; Heart Disease: No; Hypertension: Yes - Father; Kidney Disease: No; Lung Disease: No; Seizures: No; Stroke: Yes - Mother; Thyroid Problems: No; Tuberculosis: No; Never smoker; Marital Status - Married; Alcohol Use: Never; Drug Use: No History; Caffeine Use: Daily; Advanced Directives: Yes (Copy provided); Patient does not want information on Advanced Directives; Living Will: Yes (Copy provided) Physician Affirmation I have reviewed and agree with the above information. Electronic Signature(s) Signed:  03/09/2018 9:02:15 AM By: Worthy Keeler [name] Signed: 03/09/2018 4:53:55 PM By: Roger Shelter Entered By: Worthy Keeler on 03/09/2018 08:10:43 TERRACE, FONTANILLA ([id number]) -------------------------------------------------------------------------------- SuperBill Details Patient Name: [name] Date of Service: 03/08/2018 Medical Record Number: [id number] Patient Account Number: [id number] Date of Birth/Sex: 1951/01/16 (67 y.o. 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

## 2018-03-29 ENCOUNTER — Encounter: Payer: Medicare Other | Attending: Physician Assistant | Admitting: Physician Assistant

## 2018-03-29 DIAGNOSIS — I10 Essential (primary) hypertension: Secondary | ICD-10-CM | POA: Diagnosis not present

## 2018-03-29 DIAGNOSIS — G8220 Paraplegia, unspecified: Secondary | ICD-10-CM | POA: Diagnosis not present

## 2018-03-29 DIAGNOSIS — L89313 Pressure ulcer of right buttock, stage 3: Secondary | ICD-10-CM | POA: Diagnosis not present

## 2018-03-29 DIAGNOSIS — Z8546 Personal history of malignant neoplasm of prostate: Secondary | ICD-10-CM | POA: Insufficient documentation

## 2018-03-30 NOTE — Progress Notes (Signed)
JESSICA, SEIDMAN ([id number]) Visit Report for 03/29/2018 Arrival Information Details Patient Name: HIEN, PERREIRA Date of Service: 03/29/2018 10:00 AM Medical Record Number: [id number] Patient Account Number: [id number] Date of Birth/Sex: November 18, 1950 (67 y.o. M) Treating RN: Roger Shelter Primary Care Aneth Schlagel: [name] Other Clinician: Referring Jennie Hannay: [name] Treating Yaniah Thiemann/Extender: [name], HOYT Weeks in Treatment: 43 Visit Information History Since Last Visit Added or deleted any medications: No Patient Arrived: Wheel Chair Any new allergies or adverse reactions: No Arrival Time: 10:09 Had a fall or experienced change in No Accompanied By: wife activities of daily living that may affect Transfer Assistance: None risk of falls: Patient Identification Verified: Yes Signs or symptoms of abuse/neglect since last visito No Secondary Verification Process Completed: Yes Hospitalized since last visit: No Patient Requires Transmission-Based No Implantable device outside of the clinic excluding No Precautions: cellular tissue based products placed in the center Patient Has Alerts: Yes since last visit: Patient Alerts: NOT Has Dressing in Place as Prescribed: Yes Diabetic Pain Present Now: No Electronic Signature(s) Signed: 03/29/2018 2:19:42 PM By: Lorine Bears RCP, RRT, CHT Entered By: Lorine Bears on 03/29/2018 10:09:58 [name] ([id number]) -------------------------------------------------------------------------------- Encounter Discharge Information Details Patient Name: [name] Date of Service: 03/29/2018 10:00 AM Medical Record Number: [id number] Patient Account Number: [id number] Date of Birth/Sex: 1951-07-06 (67 y.o. M) Treating RN: Roger Shelter Primary Care Elvenia Godden: [name] Other Clinician: Referring Angla Delahunt: [name] Treating Danyah Guastella/Extender: [name],  HOYT Weeks in Treatment: 17 Encounter Discharge Information Items Discharge Condition: Stable Ambulatory Status: Wheelchair Discharge Destination: Home Transportation: Private Auto Schedule Follow-up Appointment: Yes Clinical Summary of Care: Electronic Signature(s) Signed: 03/29/2018 4:39:16 PM By: Roger Shelter Entered By: Roger Shelter on 03/29/2018 10:56:58 [name] ([id number]) -------------------------------------------------------------------------------- Lower Extremity Assessment Details Patient Name: [name] Date of Service: 03/29/2018 10:00 AM Medical Record Number: [id number] Patient Account Number: [id number] Date of Birth/Sex: [birth year] (67 y.o. M) Treating RN: Secundino Ginger Primary Care Alan Riles: [name] Other Clinician: Referring Cheryllynn Sarff: [name] Treating Ghalia Reicks/Extender: [name], HOYT Weeks in Treatment: 17 Electronic Signature(s) Signed: 03/29/2018 1:37:53 PM By: Secundino Ginger Entered By: Secundino Ginger on 03/29/2018 10:23:35 FLAY, GHOSH ([id number]) -------------------------------------------------------------------------------- Multi Wound Chart Details Patient Name: [name] Date of Service: 03/29/2018 10:00 AM Medical Record Number: [id number] Patient Account Number: [id number] Date of Birth/Sex: Mar 16, 1951 (67 y.o. M) Treating RN: Roger Shelter Primary Care Dangela How: [name] Other Clinician: Referring Kimberly Nieland: [name] Treating [place] Nolton/Extender: [name], HOYT Weeks in Treatment: 17 Vital Signs Height(in): 73 Pulse(bpm): 55 Weight(lbs): 210 Blood Pressure(mmHg): 140/68 Body Mass Index(BMI): 28 Temperature(F): 98.2 Respiratory Rate 16 (breaths/min): Photos: [N/A:N/A] Wound Location: Right Gluteal fold N/A N/A Wounding Event: Pressure Injury N/A N/A Primary Etiology: Pressure Ulcer N/A N/A Comorbid History: Cataracts, Hypertension, N/A N/A Paraplegia Date Acquired:  11/02/2017 N/A N/A Weeks of Treatment: 17 N/A N/A Wound Status: Open N/A N/A Measurements L x W x D 4.5x3.5x0.2 N/A N/A (cm) Area (cm) : 12.37 N/A N/A Volume (cm) : 2.474 N/A N/A % Reduction in Area: -23.00% N/A N/A % Reduction in Volume: -146.20% N/A N/A Classification: Category/Stage III N/A N/A Exudate Amount: Medium N/A N/A Exudate Type: Serous N/A N/A Exudate Color: amber N/A N/A Wound Margin: Flat and Intact N/A N/A Granulation Amount: Medium (34-66%) N/A N/A Granulation Quality: Pink N/A N/A Necrotic Amount: Medium (34-66%) N/A N/A Exposed Structures: Fat Layer (Subcutaneous N/A N/A Tissue) Exposed: Yes Fascia: No Tendon: No Muscle: No Joint: No Bone: No Pancake, Yosgart  J. ([id number]) Epithelialization: None N/A N/A Periwound Skin Texture: Excoriation: No N/A N/A Induration: No Callus: No Crepitus: No Rash: No Scarring: No Periwound Skin Moisture: Maceration: Yes N/A N/A Dry/Scaly: No Periwound Skin Color: Atrophie Blanche: No N/A N/A Cyanosis: No Ecchymosis: No Erythema: No Hemosiderin Staining: No Mottled: No Pallor: No Rubor: No Temperature: No Abnormality N/A N/A Tenderness on Palpation: No N/A N/A Wound Preparation: Ulcer Cleansing: N/A N/A Rinsed/Irrigated with Saline Topical Anesthetic Applied: Other: lidocaine 4% Treatment Notes Electronic Signature(s) Signed: 03/29/2018 4:39:16 PM By: Roger Shelter Entered By: Roger Shelter on 03/29/2018 10:30:56 CJ, EDGELL ([id number]) -------------------------------------------------------------------------------- [place] Details Patient Name: [name] Date of Service: 03/29/2018 10:00 AM Medical Record Number: [id number] Patient Account Number: [id number] Date of Birth/Sex: January 14, 1951 (67 y.o. M) Treating RN: Roger Shelter Primary Care Heer Justiss: [name] Other Clinician: Referring Rhiannan Kievit: [name] Treating Greenly Rarick/Extender: [name], HOYT Weeks in Treatment: 17 Active Inactive ` Orientation to the Wound Care Program Nursing Diagnoses: Knowledge deficit related to the wound healing center program Goals: Patient/caregiver will verbalize understanding of the [place] Program Date Initiated: 11/30/2017 Target Resolution Date: 12/21/2017 Goal Status: Active Interventions: Provide education on orientation to the wound center Notes: ` Wound/Skin Impairment Nursing Diagnoses: Impaired tissue integrity Goals: Patient/caregiver will verbalize understanding of skin care regimen Date Initiated: 11/30/2017 Target Resolution Date: 12/21/2017 Goal Status: Active Ulcer/skin breakdown will have a volume reduction of 30% by week 4 Date Initiated: 11/30/2017 Target Resolution Date: 12/21/2017 Goal Status: Active Interventions: Assess patient/caregiver ability to obtain necessary supplies Assess patient/caregiver ability to perform ulcer/skin care regimen upon admission and as needed Assess ulceration(s) every visit Treatment Activities: Skin care regimen initiated : 11/30/2017 Notes: Electronic Signature(s) Signed: 03/29/2018 4:39:16 PM By: Patrick North, [name] ([id number]) Entered By: Roger Shelter on 03/29/2018 10:30:48 BRASEN, BUNDREN ([id number]) -------------------------------------------------------------------------------- Pain Assessment Details Patient Name: [name] Date of Service: 03/29/2018 10:00 AM Medical Record Number: [id number] Patient Account Number: [id number] Date of Birth/Sex: 1951-07-16 (67 y.o. M) Treating RN: Roger Shelter Primary Care Anjelique Makar: [name] Other Clinician: Referring Rudene Poulsen: [name] Treating Johne Buckle/Extender: [name], HOYT Weeks in Treatment: 17 Active Problems Location of Pain Severity and Description of Pain Patient Has Paino No Site Locations Pain Management and Medication Current Pain  Management: Electronic Signature(s) Signed: 03/29/2018 2:19:42 PM By: Lorine Bears RCP, RRT, CHT Signed: 03/29/2018 4:39:16 PM By: Roger Shelter Entered By: Lorine Bears on 03/29/2018 10:10:10 DEZI, SCHANER ([id number]) -------------------------------------------------------------------------------- Patient/Caregiver Education Details Patient Name: [name] Date of Service: 03/29/2018 10:00 AM Medical Record Number: [id number] Patient Account Number: [id number] Date of Birth/Gender: 07/26/1950 (67 y.o. M) Treating RN: Roger Shelter Primary Care Physician: [name] Other Clinician: Referring Physician: [name] Treating Physician/Extender: Sharalyn Ink in Treatment: 17 Education Assessment Education Provided To: Patient Education Topics Provided Wound Debridement: Handouts: Wound Debridement Methods: Explain/Verbal Responses: State content correctly Wound/Skin Impairment: Handouts: Caring for Your Ulcer Methods: Explain/Verbal Responses: State content correctly Electronic Signature(s) Signed: 03/29/2018 4:39:16 PM By: Roger Shelter Entered By: Roger Shelter on 03/29/2018 10:57:14 [name] ([id number]) -------------------------------------------------------------------------------- Wound Assessment Details Patient Name: [name] Date of Service: 03/29/2018 10:00 AM Medical Record Number: [id number] Patient Account Number: [id number] Date of Birth/Sex: 1951/05/24 (67 y.o. M) Treating RN: Secundino Ginger Primary Care Paxson Harrower: [name] Other Clinician: Referring Ervey Fallin: [name] Treating Teighan Aubert/Extender: [name], HOYT Weeks in Treatment: 17 Wound Status Wound Number: 1 Primary Etiology: Pressure Ulcer Wound Location: Right  Gluteal fold Wound Status: Open Wounding Event: Pressure Injury Comorbid History: Cataracts, Hypertension, Paraplegia Date Acquired:  11/02/2017 Weeks Of Treatment: 17 Clustered Wound: No Photos Photo Uploaded By: Secundino Ginger on 03/29/2018 10:30:05 Wound Measurements Length: (cm) 4.5 Width: (cm) 3.5 Depth: (cm) 0.2 Area: (cm) 12.37 Volume: (cm) 2.474 % Reduction in Area: -23% % Reduction in Volume: -146.2% Epithelialization: None Tunneling: No Undermining: No Wound Description Classification: Category/Stage III Foul Odor A Wound Margin: Flat and Intact Slough/Fibr Exudate Amount: Medium Exudate Type: Serous Exudate Color: amber fter Cleansing: No ino Yes Wound Bed Granulation Amount: Medium (34-66%) Exposed Structure Granulation Quality: Pink Fascia Exposed: No Necrotic Amount: Medium (34-66%) Fat Layer (Subcutaneous Tissue) Exposed: Yes Necrotic Quality: Adherent Slough Tendon Exposed: No Muscle Exposed: No Joint Exposed: No Bone Exposed: No Periwound Skin Texture Gesner, Zaeem J. ([id number]) Texture Color No Abnormalities Noted: No No Abnormalities Noted: No Callus: No Atrophie Blanche: No Crepitus: No Cyanosis: No Excoriation: No Ecchymosis: No Induration: No Erythema: No Rash: No Hemosiderin Staining: No Scarring: No Mottled: No Pallor: No Moisture Rubor: No No Abnormalities Noted: No Dry / Scaly: No Temperature / Pain Maceration: Yes Temperature: No Abnormality Wound Preparation Ulcer Cleansing: Rinsed/Irrigated with Saline Topical Anesthetic Applied: Other: lidocaine 4%, Treatment Notes Wound #1 (Right Gluteal fold) 1. Cleansed with: Clean wound with Normal Saline 2. Anesthetic Topical Lidocaine 4% cream to wound bed prior to debridement 3. Peri-wound Care: Skin Prep 4. Dressing Applied: Iodoflex 5. Secondary Dressing Applied Bordered Foam Dressing Electronic Signature(s) Signed: 03/29/2018 1:37:53 PM By: Secundino Ginger Entered By: Secundino Ginger on 03/29/2018 10:23:26 DAMONEY, JULIA  ([id number]) -------------------------------------------------------------------------------- Vitals Details Patient Name: [name] Date of Service: 03/29/2018 10:00 AM Medical Record Number: [id number] Patient Account Number: [id number] Date of Birth/Sex: 1951-03-20 (67 y.o. M) Treating RN: Roger Shelter Primary Care Anyelo Mccue: [name] Other Clinician: Referring Elveria Lauderbaugh: [name] Treating Brandell Maready/Extender: [name], HOYT Weeks in Treatment: 17 Vital Signs Time Taken: 10:08 Temperature (F): 98.2 Height (in): 73 Pulse (bpm): 55 Weight (lbs): 210 Respiratory Rate (breaths/min): 16 Body Mass Index (BMI): 27.7 Blood Pressure (mmHg): 140/68 Reference Range: 80 - 120 mg / dl Electronic Signature(s) Signed: 03/29/2018 2:19:42 PM By: Lorine Bears RCP, RRT, CHT Entered By: Lorine Bears on 03/29/2018 10:10:47

## 2018-03-30 NOTE — Progress Notes (Signed)
[name], [name] ([id number]) Visit Report for 03/29/2018 Chief Complaint Document Details Patient Name: [name], [name] Date of Service: 03/29/2018 10:00 AM Medical Record Number: [id number] Patient Account Number: [id number] Date of Birth/Sex: 1951-03-17 (67 y.o. M) Treating RN: Roger Shelter Primary Care Provider: [name] Other Clinician: Referring Provider: [name] Treating Provider/Extender: [name], HOYT Weeks in Treatment: 17 Information Obtained from: Patient Chief Complaint Right gluteal fold ulcer Electronic Signature(s) Signed: 03/29/2018 12:14:07 PM By: Worthy Keeler [name] Entered By: Worthy Keeler on 03/29/2018 10:08:11 [name], [name] ([id number]) -------------------------------------------------------------------------------- Debridement Details Patient Name: [name] Date of Service: 03/29/2018 10:00 AM Medical Record Number: [id number] Patient Account Number: [id number] Date of Birth/Sex: June 10, 1951 (67 y.o. M) Treating RN: Roger Shelter Primary Care Provider: [name] Other Clinician: Referring Provider: [name] Treating Provider/Extender: [name], HOYT Weeks in Treatment: 17 Debridement Performed for Wound #1 Right Gluteal fold Assessment: Performed By: Physician [name], HOYT E., [name] Debridement Type: Debridement Pre-procedure Verification/Time Yes - 10:36 Out Taken: Start Time: 10:36 Pain Control: Other : lidocaine 4% Total Area Debrided (L x W): 4.5 (cm) x 3.5 (cm) = 15.75 (cm) Tissue and other material Viable, Non-Viable, Slough, Subcutaneous, Biofilm, Slough debrided: Level: Skin/Subcutaneous Tissue Debridement Description: Excisional Instrument: Curette Bleeding: Minimum Hemostasis Achieved: Pressure End Time: 10:38 Procedural Pain: 0 Post Procedural Pain: 0 Response to Treatment: Procedure was tolerated well Level of Consciousness: Awake and Alert Post Debridement  Measurements of Total Wound Length: (cm) 4.5 Stage: Category/Stage III Width: (cm) 3.5 Depth: (cm) 0.2 Volume: (cm) 2.474 Character of Wound/Ulcer Post Stable Debridement: Post Procedure Diagnosis Same as Pre-procedure Electronic Signature(s) Signed: 03/29/2018 12:14:07 PM By: Worthy Keeler [name] Signed: 03/29/2018 4:39:16 PM By: Roger Shelter Entered By: Roger Shelter on 03/29/2018 10:37:14 [name], [name] ([id number]) -------------------------------------------------------------------------------- HPI Details Patient Name: [name] Date of Service: 03/29/2018 10:00 AM Medical Record Number: [id number] Patient Account Number: [id number] Date of Birth/Sex: 1951-02-09 (67 y.o. 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 He states that there is no history of diabetes that he is aware of. He does have issues with his prostate and is currently receiving treatment for this by way of oral medication. With that being said I do not have a lot of details in that regard. Nonetheless the patient presents today as a result of having been referred to [place] by another provider initially home health was set to come out and take care of his wound although due to the fact that he apparently drives he's not able to receive home health. His wife is therefore trying to help take care of this wound within although they have been struggling with what exactly to do at this point. She states that she can do some things but she is definitely not a  nurse and does have some issues with looking at blood. The good news is the wound does not appear to be too deep and is fairly superficial at this point. There is no slough noted there is some nonviable skin noted around the surface of the wound and the perimeter at this  point. The central portion of the wound appears to be very good with a dermal layer noted this does not appear to be again deep enough to extend it to subcutaneous tissue at this point. 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 In the pump he has Dilaudid and baclofen. 12/07/17 on evaluation today patient presents for follow-up concerning his ongoing lower back thigh ulcer on the right. He states that he did not get the supplies ordered and therefore has not really been able to perform the dressing changes as directed exactly. His wife was able to get some Boarder Foam Dressing's from the drugstore and subsequently has been using hydrogel which did help to a degree in the wound does appear to be able smaller. There is actually more drainage this week noted than previous. 12/21/17 on evaluation today patient appears to be doing rather well in regard to his right gluteal ulcer. He has been tolerating the dressing changes without complication. There does not appear to be any evidence of infection at this point in time. Overall the wound does seem to be making some progress as far as the edges are concerned there's not as much in the way of overlapping of the external wound edges and he has a good epithelium to wound bed border for the most part. This however is not true right at the 12 o'clock location over the span of a little over a centimeters which actually will require debridement today to clean this away and hopefully allow it to continue to heal more appropriately. 12/28/17 on evaluation today patient appears to be doing  rather well in regard to his ulcer in the left gluteal region. He's been tolerating the dressing changes without complication. Apparently he has had some difficulty getting his dressing material. Apparently there's been some confusion with ordering we're gonna check into this. Nonetheless overall he's been showing signs of improvement which is good news. Debridement is not required today. 01/04/18 on evaluation today patient presents for follow-up concerning his right gluteal ulcer. He has been tolerating the dressing changes fairly well. On inspection today it appears he may actually have some maceration them concerned about the fact that he may be developing too much moisture in and around the wound bed which can cause delay in healing. With that being said he unfortunately really has not showed significant signs of improvement since last week's evaluation in fact this may even be just the little bit/slightly larger. Nonetheless he's been having a lot of discomfort I'm not sure this is even related to the wound as he has no pain when I'm to breeding or otherwise cleaning the wound during evaluation today. Nonetheless this is something that we did recommend he talked to his pain specialist concerning. 01/11/18 on evaluation today patient appears to be doing better in regard to his ulceration. He has been tolerating the dressing changes without complication. With that being said overall there's no evidence of infection which is good news. The only thing is he did receive the hatch affair blue classic versus the ready nonetheless I feel like this is perfectly fine and appears to have done well for him over the past week. 01/25/18 on evaluation today patient's wound actually appears to be a little bit larger than during the last evaluation. The good TIGE, MEAS. ([id number]) news is the majority of the wound edges actually appear to be fairly  firmly attached to the wound bed unfortunately  again we're not really making progress in regard to the size. Roughly the wound is about the same size as when I first saw him although again the wound margin/edges appear to be much better. 02/01/18 on evaluation today patient actually appears to be doing very well in regard to his wound. Applying the Prisma dry does seem to be better although he does still have issues with slow progression of the wound. There was a slight improvement compared to last week's measurements today. Nonetheless I have been considering other options as far as the possibility of Theraskin or even a snap vac. In general I'm not sure that the Theraskin due to location of the wound would be a very good idea. Nonetheless I do think that a snap vac could be a possibility for the patient and in fact I think this could even be an excellent way to manage the wound possibly seeing some improvement in a very rapid fashion here. Nonetheless this is something that we would need to get approved and I did have a lengthy conversation with the patient about this today. 02/08/18 on evaluation today patient appears to be doing a little better in regard to his ulcer. He has been tolerating the dressing changes without complication. Fortunately despite the fact that the wound is a little bit smaller it's not significantly so unfortunately. We have discussed the possibility of a snap vac we did check with insurance this is actually covered at this point. Fortunately there does not appear to be any sign of infection. Overall I'm fairly pleased with how things seem to be appearing at this point. 02/15/18 on evaluation today patient appears to be doing rather well in regard to his right gluteal ulcer. Unfortunately the snap vac did not stay in place with his sheer and friction this came loose and did not seem to maintain seal very well. He worked for about two days and it did seem to do very well during that time according to his wife but in  general this does not seem to be something that's gonna be beneficial for him long-term. I do believe we need to go back to standard dressings to see if we can find something that will be of benefit. 03/02/18- He is here in follow up evaluation; there is minimal change in the wound. He will continue with the same treatment plan, would consider changing to iodosrob/iodoflex if ulcer continues to to plateau. He will follow up next week 03/08/18 on evaluation today patient's wound actually appears to be about the same size as when I previously saw him several weeks back. Unfortunately he does have some slightly dark discoloration in the central portion of the wound which has me concerned about pressure injury. I do believe he may be sitting for too long a period of time in fact he tells me that "I probably sit for much too long". He does have some Slough noted on the surface of the wound and again as far as the size of the wound is concerned I'm really not seeing anything that seems to have improved significantly. 03/15/18 on evaluation today patient appears to be doing fairly well in regard to his ulcer. The wound measured pretty much about the same today compared to last week's evaluation when looking at his graph. With that being said the area of bruising/deep tissue injury that was noted last week I do not see at this point. He did get a new  cushion fortunately this does seem to be have been of benefit in my pinion. It does appear that he's been off of this more which is good news as well I think that is definitely showing in the overall wound measurements. With that being said I do believe that he needs to continue to offload I don't think that the fact this is doing better should be or is going to allow him to not have to offload and explain this to him as well. Overall he seems to be in agreement the plan I think he understands. The overall appearance of the wound bed is improved compared to last  week I think the Iodoflex has been beneficial in that regard. 03/29/18 on evaluation today patient actually appears to be doing rather well in regard to his wound from the overall appearance standpoint he does have some granulation although there's some Slough on the surface of the wound noted as well. With that being said he unfortunately has not improved in regard to the overall measurement of the wound in volume or in size. I did have a discussion with him very specifically about offloading today. He actually does work although he mainly is just sitting throughout the day. He tells me he offloads by "lifting himself up for 30 seconds off of his chair occasionally" purchase from [employer] which does seem to have helped. And he has a new cushion that he with that being said he's also able to stand some for a very short period of time but not significant enough I think to provide appropriate offloading. I think the biggest issue at this point with the wound and the fact is not healing as quickly as we would like is due to the fact that he is really not able to appropriately offload while at work. He states the beginning after his injury he actually had a bed at his job that he could lay on in order to offload and that does seem to have been of help back at that time. Nonetheless he had not done this in quite some time unfortunately. I think that could be helpful for him this is something I would like for him to look into. Electronic Signature(s) Signed: 03/29/2018 12:14:07 PM By: Worthy Keeler [name] Entered By: Worthy Keeler on 03/29/2018 10:59:18 [name], [name] ([id number]) [name], [name] ([id number]) -------------------------------------------------------------------------------- Physical Exam Details Patient Name: [name] Date of Service: 03/29/2018 10:00 AM Medical Record Number: [id number] Patient Account Number: [id number] Date of Birth/Sex: 1951/07/20 (67 y.o.  M) Treating RN: Roger Shelter Primary Care Provider: [name] Other Clinician: Referring Provider: [name] Treating Provider/Extender: [name], HOYT Weeks in Treatment: 70 Constitutional Well-nourished and well-hydrated in no acute distress. Respiratory normal breathing without difficulty. Psychiatric this patient is able to make decisions and demonstrates good insight into disease process. Alert and Oriented x 3. pleasant and cooperative. Notes At this point patient's wound actually show signs of a little bit of improvement in regard to the overall surface quality of the wound. Nonetheless there still slough noted and the overall size has not diminished unfortunately. Sharp debridement was performed to clear away the slough noted on the surface of the wound today he tolerated this without complication. Electronic Signature(s) Signed: 03/29/2018 12:14:07 PM By: Worthy Keeler [name] Entered By: Worthy Keeler on 03/29/2018 12:07:18 [name], [name] ([id number]) -------------------------------------------------------------------------------- Physician Orders Details Patient Name: [name] Date of Service: 03/29/2018 10:00 AM Medical Record Number: [id number] Patient  Account Number: [id number] Date of Birth/Sex: 1950/10/31 (67 y.o. 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 Anesthetic (add to Medication List) Wound #1 Right Gluteal fold o Topical  Lidocaine 4% cream applied to wound bed prior to debridement (In Clinic Only). Skin Barriers/Peri-Wound Care Wound #1 Right Gluteal fold o Skin Prep Primary Wound Dressing Wound #1 Right Gluteal fold o Iodoflex Secondary Dressing Wound #1 Right Gluteal fold o Boardered Foam Dressing Dressing Change Frequency Wound #1 Right Gluteal fold o Other: - change every three days and as needed Follow-up Appointments Wound #1 Right Gluteal fold o Return Appointment in 1 week. Electronic Signature(s) Signed: 03/29/2018 12:14:07 PM By: Worthy Keeler [name] Signed: 03/29/2018 4:39:16 PM By: Roger Shelter Entered By: Roger Shelter on 03/29/2018 10:39:51 [name], [name] ([id number], [name] ([id number]) -------------------------------------------------------------------------------- Problem List Details Patient Name: [name] Date of Service: 03/29/2018 10:00 AM Medical Record Number: [id number] Patient Account Number: [id number] Date of Birth/Sex: 05/31/1951 (67 y.o. 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 He states that there is no history of diabetes that he is aware of. He does have issues with his prostate and is currently receiving treatment for this by way of oral medication. With that being said I do not have a lot of details in that regard. Nonetheless the patient presents today as a result of having been referred to [place] by another provider initially home health was set to come out and take care of his wound although due to the fact that he apparently drives he's not able to receive home health. His wife is therefore trying to help take care of this wound within although they have been struggling with what exactly to do at this point. She states that she can do some things but she is definitely not a nurse and does have some issues with looking at blood. The good news  is the wound does not appear to be too deep and is fairly superficial at this point. There is no slough noted there is some nonviable skin noted around the surface of the wound and the perimeter at this point. The central portion of the wound appears to be very good with a dermal layer noted this does not appear to be again deep enough to extend it to subcutaneous tissue at this point. Overall the patient for a paraplegic seems to  be functioning fairly well he does have both a spinal cord stimulator as well is the intrathecal pump. In the pump he has Dilaudid and baclofen. 12/07/17 on evaluation today patient presents for follow-up concerning his ongoing lower back thigh ulcer on the right. He states that he did not get the supplies ordered and therefore has not really been able to perform the dressing changes as directed exactly. His wife was able to get some Boarder Foam Dressing's from the drugstore and subsequently has been using hydrogel which did help to a degree in the wound does appear to be able smaller. There is actually more drainage this week noted than previous. 12/21/17 on evaluation today patient appears to be doing rather well in regard to his right gluteal ulcer. He has been tolerating the dressing changes without complication. There does not appear to be any evidence of infection at this point in time. Overall the wound does seem to be making some progress as far as the edges are concerned there's not as much in the way of overlapping of the external wound edges and he has a good epithelium to wound bed border for the most part. This however is not true right at the 12 o'clock location over the span of a little over a centimeters which actually will require debridement today to clean this away and hopefully allow it to continue to heal more appropriately. 12/28/17 on evaluation today patient appears to be doing rather well in regard to his ulcer in the left gluteal region. He's been tolerating the dressing changes without complication. Apparently he has had some difficulty getting his dressing material. Apparently there's been some confusion with ordering we're gonna check into this. Nonetheless overall he's been showing signs of improvement which is good news. Debridement is not required today. 01/04/18 on evaluation today patient presents for follow-up concerning his right gluteal ulcer. He has been tolerating  the dressing changes fairly well. On inspection today it appears he may actually have some maceration them concerned about the fact that he may be developing too much moisture in and around the wound bed which can cause delay in healing. With that being said he unfortunately really has not showed significant signs of improvement since last week's evaluation in fact this may even be just the little bit/slightly larger. Nonetheless he's been having a lot of discomfort I'm not sure this is even related to the wound as he has no pain when I'm to breeding or otherwise cleaning the wound during evaluation today. Nonetheless this is something that we did recommend he talked to his pain specialist concerning. 01/11/18 on evaluation today patient appears to be doing better in regard to his ulceration. He has been tolerating the dressing Riggan, Grey J. ([id number]) changes without complication. With that being said overall there's no evidence of infection which is good news. The only thing is he did receive the hatch affair blue classic versus the ready nonetheless I feel like this is perfectly fine and appears to have done well  for him over the past week. 01/25/18 on evaluation today patient's wound actually appears to be a little bit larger than during the last evaluation. The good news is the majority of the wound edges actually appear to be fairly firmly attached to the wound bed unfortunately again we're not really making progress in regard to the size. Roughly the wound is about the same size as when I first saw him although again the wound margin/edges appear to be much better. 02/01/18 on evaluation today patient actually appears to be doing very well in regard to his wound. Applying the Prisma dry does seem to be better although he does still have issues with slow progression of the wound. There was a slight improvement compared to last week's measurements today. Nonetheless I have been considering  other options as far as the possibility of Theraskin or even a snap vac. In general I'm not sure that the Theraskin due to location of the wound would be a very good idea. Nonetheless I do think that a snap vac could be a possibility for the patient and in fact I think this could even be an excellent way to manage the wound possibly seeing some improvement in a very rapid fashion here. Nonetheless this is something that we would need to get approved and I did have a lengthy conversation with the patient about this today. 02/08/18 on evaluation today patient appears to be doing a little better in regard to his ulcer. He has been tolerating the dressing changes without complication. Fortunately despite the fact that the wound is a little bit smaller it's not significantly so unfortunately. We have discussed the possibility of a snap vac we did check with insurance this is actually covered at this point. Fortunately there does not appear to be any sign of infection. Overall I'm fairly pleased with how things seem to be appearing at this point. 02/15/18 on evaluation today patient appears to be doing rather well in regard to his right gluteal ulcer. Unfortunately the snap vac did not stay in place with his sheer and friction this came loose and did not seem to maintain seal very well. He worked for about two days and it did seem to do very well during that time according to his wife but in general this does not seem to be something that's gonna be beneficial for him long-term. I do believe we need to go back to standard dressings to see if we can find something that will be of benefit. 03/02/18- He is here in follow up evaluation; there is minimal change in the wound. He will continue with the same treatment plan, would consider changing to iodosrob/iodoflex if ulcer continues to to plateau. He will follow up next week 03/08/18 on evaluation today patient's wound actually appears to be about the same size  as when I previously saw him several weeks back. Unfortunately he does have some slightly dark discoloration in the central portion of the wound which has me concerned about pressure injury. I do believe he may be sitting for too long a period of time in fact he tells me that "I probably sit for much too long". He does have some Slough noted on the surface of the wound and again as far as the size of the wound is concerned I'm really not seeing anything that seems to have improved significantly. 03/15/18 on evaluation today patient appears to be doing fairly well in regard to his ulcer. The wound measured pretty much about  the same today compared to last week's evaluation when looking at his graph. With that being said the area of bruising/deep tissue injury that was noted last week I do not see at this point. He did get a new cushion fortunately this does seem to be have been of benefit in my pinion. It does appear that he's been off of this more which is good news as well I think that is definitely showing in the overall wound measurements. With that being said I do believe that he needs to continue to offload I don't think that the fact this is doing better should be or is going to allow him to not have to offload and explain this to him as well. Overall he seems to be in agreement the plan I think he understands. The overall appearance of the wound bed is improved compared to last week I think the Iodoflex has been beneficial in that regard. 03/29/18 on evaluation today patient actually appears to be doing rather well in regard to his wound from the overall appearance standpoint he does have some granulation although there's some Slough on the surface of the wound noted as well. With that being said he unfortunately has not improved in regard to the overall measurement of the wound in volume or in size. I did have a discussion with him very specifically about offloading today. He actually does work  although he mainly is just sitting throughout the day. He tells me he offloads by "lifting himself up for 30 seconds off of his chair occasionally" purchase from [employer] which does seem to have helped. And he has a new cushion that he with that being said he's also able to stand some for a very short period of time but not significant enough I think to provide appropriate offloading. I think the biggest issue at this point with the wound and the fact is not healing as quickly as we would like is due to the fact that he is really not able to appropriately offload while at work. He states the beginning after his injury he actually had a bed at his job that he could lay on in order to offload and that does seem to have been of help back at that time. Nonetheless he had not done this in quite some time unfortunately. I think that could be helpful for him this is something I would like for him to look into. [name], [name] ([id number]) Patient History Information obtained from Patient. Family History Hypertension - Father, Stroke - Mother, No family history of Cancer, Diabetes, Heart Disease, Kidney Disease, Lung Disease, Seizures, Thyroid Problems, Tuberculosis. Social History Never smoker, Marital Status - Married, Alcohol Use - Never, Drug Use - No History, Caffeine Use - Daily. Medical And Surgical History Notes Oncologic Prostate cancer- currently treated with horomone therapy Review of Systems (ROS) Constitutional Symptoms (General Health) Denies complaints or symptoms of Fever, Chills. Respiratory The patient has no complaints or symptoms. Cardiovascular The patient has no complaints or symptoms. Psychiatric The patient has no complaints or symptoms. Objective Constitutional Well-nourished and well-hydrated in no acute distress. Vitals Time Taken: 10:08 AM, Height: 73 in, Weight: 210 lbs, BMI: 27.7, Temperature: 98.2 F, Pulse: 55 bpm, Respiratory Rate: 16  breaths/min, Blood Pressure: 140/68 mmHg. Respiratory normal breathing without difficulty. Psychiatric this patient is able to make decisions and demonstrates good insight into disease process. Alert and Oriented x 3. pleasant and cooperative. General Notes: At this point patient's wound actually show  signs of a little bit of improvement in regard to the overall surface quality of the wound. Nonetheless there still slough noted and the overall size has not diminished unfortunately. Sharp debridement was performed to clear away the slough noted on the surface of the wound today he tolerated this without complication. Integumentary (Hair, Skin) Wound #1 status is Open. Original cause of wound was Pressure Injury. The wound is located on the Right Gluteal fold. The wound measures 4.5cm length x 3.5cm width x 0.2cm depth; 12.37cm^2 area and 2.474cm^3 volume. There is Fat [name], DOWIS. ([id number]) (Subcutaneous Tissue) Exposed exposed. There is no tunneling or undermining noted. There is a medium amount of serous drainage noted. The wound margin is flat and intact. There is medium (34-66%) pink granulation within the wound bed. There is a medium (34-66%) amount of necrotic tissue within the wound bed including Adherent Slough. The periwound skin appearance exhibited: Maceration. The periwound skin appearance did not exhibit: Callus, Crepitus, Excoriation, Induration, Rash, Scarring, Dry/Scaly, Atrophie Blanche, Cyanosis, Ecchymosis, Hemosiderin Staining, Mottled, Pallor, Rubor, Erythema. Periwound temperature was noted as No Abnormality. 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 There was a  Excisional Skin/Subcutaneous Tissue Debridement with a total area of 15.75 sq cm performed by [name], HOYT E., [name]. With the following instrument(s): Curette to remove Viable and Non-Viable tissue/material. Material removed includes Subcutaneous Tissue, Slough, and Biofilm after achieving pain control using Other (lidocaine 4%). No specimens were taken. A time out was conducted at 10:36, prior to the start of the procedure. A Minimum amount of bleeding was controlled with Pressure. The procedure was tolerated well with a pain level of 0 throughout and a pain level of 0 following the procedure. Patient s Level of Consciousness post procedure was recorded as Awake and Alert. Post Debridement Measurements: 4.5cm length x 3.5cm width x 0.2cm depth; 2.474cm^3 volume. Post debridement Stage noted as Category/Stage III. Character of Wound/Ulcer Post Debridement is stable. Post procedure Diagnosis Wound #1: Same as Pre-Procedure Plan Wound Cleansing: Wound #1 Right Gluteal fold: Clean wound with Normal Saline. Anesthetic (add to Medication List): Wound #1 Right Gluteal fold: Topical Lidocaine 4% cream applied to wound bed prior to debridement (In Clinic Only). Skin Barriers/Peri-Wound Care: Wound #1 Right Gluteal fold: Skin Prep Primary Wound Dressing: Wound #1 Right Gluteal fold: [name], [name] ([id number]) Secondary Dressing: Wound #1 Right Gluteal fold: Boardered Foam Dressing Dressing Change Frequency: Wound #1 Right Gluteal fold: Other: - change every three days and as needed Follow-up Appointments: Wound #1 Right Gluteal fold: Return Appointment in 1 week. Patient's wound at this point again seems to be somewhat stalled. I'm gonna check the insurance for the possibility of utilizing oasis to see if we can stimulate this wound to begin closure. We also had a lengthy conversation about offloading and the fact that he really does not seem to be getting enough  offloading during the course of his normal week. The patient is going to see what he can do about possibly having a bed, couch, or something of the sort to offload at work so that after every two hours or so he can take a break and spend some time offloading. He wants to continue working because it gives him something to do he states  he would go crazy if he just stayed home and I completely understand this. Nonetheless would you want this to heal and not worsened and I'm concerned that if we're not careful we may end up with a worsening situation such as infection or otherwise. He understands. We will see were things stand in one weeks time to where we are on the approval for the oasis. Please see above for specific wound care orders. We will see patient for re-evaluation in 1 week(s) here in the clinic. If anything worsens or changes patient will contact our office for additional recommendations. Electronic Signature(s) Signed: 03/29/2018 12:14:07 PM By: Worthy Keeler [name] Entered By: Worthy Keeler on 03/29/2018 12:08:38 [name], [name] ([id number]) -------------------------------------------------------------------------------- ROS/PFSH Details Patient Name: [name] Date of Service: 03/29/2018 10:00 AM Medical Record Number: [id number] Patient Account Number: [id number] Date of Birth/Sex: [birth year] (67 y.o. M) Treating RN: Roger Shelter Primary Care Provider: [name] Other Clinician: Referring Provider: [name] Treating Provider/Extender: [name], HOYT Weeks in Treatment: 17 Information Obtained From Patient Wound History Do you currently have one or more open woundso Yes How many open wounds do you currently haveo 1 Approximately how long have you had your woundso 1 month How have you been treating your wound(s) until nowo cream Has your wound(s) ever healed and then re-openedo No Have you had any lab work done in the past montho No Have you  tested positive for an antibiotic resistant organism (MRSA, VRE)o No Have you tested positive for osteomyelitis (bone infection)o No Have you had any tests for circulation on your legso No Constitutional Symptoms (General Health) Complaints and Symptoms: Negative for: Fever; Chills Eyes Medical History: Positive for: Cataracts - both removed Negative for: Glaucoma; Optic Neuritis Ear/Nose/Mouth/Throat Medical History: Negative for: Chronic sinus problems/congestion; Middle ear problems Hematologic/Lymphatic Medical History: Negative for: Anemia; Hemophilia; Human Immunodeficiency Virus; Lymphedema Respiratory Complaints and Symptoms: No Complaints or Symptoms Medical History: Negative for: Aspiration; Asthma; Chronic Obstructive Pulmonary Disease (COPD); Pneumothorax; Sleep Apnea; Tuberculosis Cardiovascular Complaints and Symptoms: No Complaints or Symptoms Medical History: Positive for: Hypertension - takes medication [name], [name]. ([id number]) Negative for: Angina; Arrhythmia; Congestive Heart Failure; Coronary Artery Disease; Deep Vein Thrombosis; Hypotension; Myocardial Infarction; Peripheral Arterial Disease; Peripheral Venous Disease; Phlebitis; Vasculitis Gastrointestinal Medical History: Negative for: Cirrhosis ; Colitis; Crohnos; Hepatitis A; Hepatitis B; Hepatitis C Endocrine Medical History: Negative for: Type I Diabetes; Type II Diabetes Genitourinary Medical History: Negative for: End Stage Renal Disease Immunological Medical History: Negative for: Lupus Erythematosus; Raynaudos; Scleroderma Integumentary (Skin) Medical History: Negative for: History of Burn; History of pressure wounds Musculoskeletal Medical History: Negative for: Gout; Rheumatoid Arthritis; Osteoarthritis; Osteomyelitis Neurologic Medical History: Positive for: Paraplegia - waist down Negative for: Dementia; Neuropathy; Quadriplegia; Seizure Disorder Oncologic Medical  History: Negative for: Received Chemotherapy; Received Radiation Past Medical History Notes: Prostate cancer- currently treated with horomone therapy Psychiatric Complaints and Symptoms: No Complaints or Symptoms Medical History: Negative for: Anorexia/bulimia; Confinement Anxiety HBO Extended History Items Eyes: Cataracts Immunizations DANE, KOPKE ([id number]) Pneumococcal Vaccine: Received Pneumococcal Vaccination: No Implantable Devices Family and Social History Cancer: No; Diabetes: No; Heart Disease: No; Hypertension: Yes - Father; Kidney Disease: No; Lung Disease: No; Seizures: No; Stroke: Yes - Mother; Thyroid Problems: No; Tuberculosis: No; Never smoker; Marital Status - Married; Alcohol Use: Never; Drug Use: No History; Caffeine Use: Daily; Advanced Directives: Yes (Copy provided); Patient does not want information on Advanced Directives; Living Will: Yes (Copy provided) Physician Affirmation I have reviewed and  agree with the above information. Electronic Signature(s) Signed: 03/29/2018 12:14:07 PM By: Worthy Keeler [name] Signed: 03/29/2018 4:39:16 PM By: Roger Shelter Entered By: Worthy Keeler on 03/29/2018 10:59:33 ISON, WICHMANN ([id number]) -------------------------------------------------------------------------------- SuperBill Details Patient Name: [name] Date of Service: 03/29/2018 Medical Record Number: [id number] Patient Account Number: [id number] Date of Birth/Sex: 01-Jun-1951 (67 y.o. 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

## 2018-04-05 ENCOUNTER — Encounter: Payer: Medicare Other | Admitting: Physician Assistant

## 2018-04-05 DIAGNOSIS — L89313 Pressure ulcer of right buttock, stage 3: Secondary | ICD-10-CM | POA: Diagnosis not present

## 2018-04-07 NOTE — Progress Notes (Signed)
PEREZ, DIRICO ([id number]) Visit Report for 04/05/2018 Chief Complaint Document Details Patient Name: [name], [name] Date of Service: 04/05/2018 10:00 AM Medical Record Number: [id number] Patient Account Number: [id number] Date of Birth/Sex: 1950/09/27 (67 y.o. M) Treating RN: Roger Shelter Primary Care Provider: [name] Other Clinician: Referring Provider: [name] Treating Provider/Extender: [name], HOYT Weeks in Treatment: 18 Information Obtained from: Patient Chief Complaint Right gluteal fold ulcer Electronic Signature(s) Signed: 04/05/2018 12:10:32 PM By: Worthy Keeler [name] Entered By: Worthy Keeler on 04/05/2018 09:57:22 Murty, [name] ([id number]) -------------------------------------------------------------------------------- Cellular or Tissue Based Product Details Patient Name: [name] Date of Service: 04/05/2018 10:00 AM Medical Record Number: [id number] Patient Account Number: [id number] Date of Birth/Sex: Oct 04, 1950 (67 y.o. M) Treating RN: [name] Primary Care Provider: [name] Other Clinician: Referring Provider: [name] Treating Provider/Extender: [name], HOYT Weeks in Treatment: 18 Cellular or Tissue Based Wound #1 Right Gluteal fold Product Type Applied to: Performed By: Physician [name], HOYT E., [name] Cellular or Tissue Based Oasis wound matrix Product Type: Level of Consciousness (Pre- Awake and Alert procedure): Pre-procedure Verification/Time Yes - 10:01 Out Taken: Location: trunk / arms / legs Wound Size (sq cm): 15.68 Product Size (sq cm): 11 Waste Size (sq cm): 0 Amount of Product Applied (sq cm): 11 Instrument Used: Forceps Lot #: [id number] Expiration Date: 09/29/2019 Fenestrated: No Reconstituted: Yes Solution Type: normal saline Solution Amount: 42ml Lot #: [id number] Solution Expiration Date: 09/19/2019 Secured: Yes Secured With: Steri-Strips Dressing Applied:  Yes Primary Dressing: Mepitel One Procedural Pain: 0 Post Procedural Pain: 0 Response to Treatment: Procedure was tolerated well Level of Consciousness (Post- Awake and Alert procedure): Post Procedure Diagnosis Same as Pre-procedure Electronic Signature(s) Signed: 04/05/2018 10:27:09 AM By: Gretta Cool, BSN, RN, CWS, Kim RN, BSN Entered By: Gretta Cool, BSN, RN, CWS, Kim on 04/05/2018 10:27:09 ODIE, EDMONDS ([id number]) -------------------------------------------------------------------------------- Debridement Details Patient Name: [name] Date of Service: 04/05/2018 10:00 AM Medical Record Number: [id number] Patient Account Number: [id number] Date of Birth/Sex: 12-25-1950 (67 y.o. M) Treating RN: [name] Primary Care Provider: [name] Other Clinician: Referring Provider: [name] Treating Provider/Extender: [name], HOYT Weeks in Treatment: 18 Debridement Performed for Wound #1 Right Gluteal fold Assessment: Performed By: Physician [name], HOYT E., [name] Debridement Type: Debridement Level of Consciousness (Pre- Awake and Alert procedure): Pre-procedure Verification/Time Yes - 09:59 Out Taken: Start Time: 09:59 Pain Control: Other : lidocaine 4% Total Area Debrided (L x W): 4.9 (cm) x 3.2 (cm) = 15.68 (cm) Tissue and other material Viable, Non-Viable, Slough, Subcutaneous, Skin: Dermis , Slough debrided: Level: Skin/Subcutaneous Tissue Debridement Description: Excisional Instrument: Curette Bleeding: Minimum Hemostasis Achieved: Pressure End Time: 10:01 Procedural Pain: 0 Post Procedural Pain: 0 Response to Treatment: Procedure was tolerated well Level of Consciousness Awake and Alert (Post-procedure): Post Debridement Measurements of Total Wound Length: (cm) 4.9 Stage: Category/Stage III Width: (cm) 3.2 Depth: (cm) 0.3 Volume: (cm) 3.695 Character of Wound/Ulcer Post Stable Debridement: Post Procedure Diagnosis Same  as Pre-procedure Electronic Signature(s) Signed: 04/05/2018 12:10:32 PM By: Worthy Keeler [name] Signed: 04/05/2018 4:52:26 PM By: Gretta Cool, BSN, RN, CWS, Kim RN, BSN Entered By: Gretta Cool, BSN, RN, CWS, Kim on 04/05/2018 10:01:55 STANCIL, DEISHER ([id number]) -------------------------------------------------------------------------------- HPI Details Patient Name: [name] Date of Service: 04/05/2018 10:00 AM Medical Record Number: [id number] Patient Account Number: [id number] Date of Birth/Sex: [birth year] (67 y.o. 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 He states that there is no history of diabetes that he is aware of. He does have issues with his prostate and is currently receiving treatment for this by way of oral medication. With that being said I do not have a lot of details in that regard. Nonetheless the patient presents today as a result of having been referred to [place] by another provider initially home health was set to come out and take care of his wound although due to the fact that he apparently drives he's not able to receive home health. His wife is therefore trying to help take care of this wound within although they have been struggling with what exactly to do at this point. She states that she can do some things but she is definitely not a nurse and does have some issues with looking at blood. The good news is the wound does not appear to be too deep and is fairly superficial at this point. There is no slough noted there is  some nonviable skin noted around the surface of the wound and the perimeter at this point. The central portion of the wound appears to be very good with a dermal layer noted this does not appear to be again deep enough to extend it to subcutaneous tissue at this point. 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 In the pump he has Dilaudid and baclofen. 12/07/17 on evaluation today patient presents for follow-up concerning his ongoing lower back thigh ulcer on the right. He states that he did not get the supplies ordered and therefore has not really been able to perform the dressing changes as directed exactly. His wife was able to get some Boarder Foam Dressing's from the drugstore and subsequently has been using hydrogel which did help to a degree in the wound does appear to be able smaller. There is actually more drainage this week noted than previous. 12/21/17 on evaluation today patient appears to be doing rather well in regard to his right gluteal ulcer. He has been tolerating the dressing changes without complication. There does not appear to be any evidence of infection at this point in time. Overall the wound does seem to be making some progress as far as the edges are concerned there's not as much in the way of overlapping of the external wound edges and he has a good epithelium to wound bed border for the most part. This however is not true right at the 12 o'clock location over the span of a little over a centimeters which actually will require debridement today to clean this away and hopefully allow it to continue to heal more appropriately. 12/28/17 on evaluation today patient appears to be doing rather well in regard to his ulcer in the left gluteal region. He's been tolerating the dressing changes without complication. Apparently he has had some difficulty getting his dressing  material. Apparently there's been some confusion with ordering we're gonna check into this. Nonetheless overall he's been showing signs of improvement which is good news. Debridement is not required today. 01/04/18 on evaluation  today patient presents for follow-up concerning his right gluteal ulcer. He has been tolerating the dressing changes fairly well. On inspection today it appears he may actually have some maceration them concerned about the fact that he may be developing too much moisture in and around the wound bed which can cause delay in healing. With that being said he unfortunately really has not showed significant signs of improvement since last week's evaluation in fact this may even be just the little bit/slightly larger. Nonetheless he's been having a lot of discomfort I'm not sure this is even related to the wound as he has no pain when I'm to breeding or otherwise cleaning the wound during evaluation today. Nonetheless this is something that we did recommend he talked to his pain specialist concerning. 01/11/18 on evaluation today patient appears to be doing better in regard to his ulceration. He has been tolerating the dressing changes without complication. With that being said overall there's no evidence of infection which is good news. The only thing is he did receive the hatch affair blue classic versus the ready nonetheless I feel like this is perfectly fine and appears to have done well for him over the past week. 01/25/18 on evaluation today patient's wound actually appears to be a little bit larger than during the last evaluation. The good CIAN, COSTANZO. ([id number]) news is the majority of the wound edges actually appear to be fairly firmly attached to the wound bed unfortunately again we're not really making progress in regard to the size. Roughly the wound is about the same size as when I first saw him although again the wound margin/edges appear to be much  better. 02/01/18 on evaluation today patient actually appears to be doing very well in regard to his wound. Applying the Prisma dry does seem to be better although he does still have issues with slow progression of the wound. There was a slight improvement compared to last week's measurements today. Nonetheless I have been considering other options as far as the possibility of Theraskin or even a snap vac. In general I'm not sure that the Theraskin due to location of the wound would be a very good idea. Nonetheless I do think that a snap vac could be a possibility for the patient and in fact I think this could even be an excellent way to manage the wound possibly seeing some improvement in a very rapid fashion here. Nonetheless this is something that we would need to get approved and I did have a lengthy conversation with the patient about this today. 02/08/18 on evaluation today patient appears to be doing a little better in regard to his ulcer. He has been tolerating the dressing changes without complication. Fortunately despite the fact that the wound is a little bit smaller it's not significantly so unfortunately. We have discussed the possibility of a snap vac we did check with insurance this is actually covered at this point. Fortunately there does not appear to be any sign of infection. Overall I'm fairly pleased with how things seem to be appearing at this point. 02/15/18 on evaluation today patient appears to be doing rather well in regard to his right gluteal ulcer. Unfortunately the snap vac did not stay in place with his sheer and friction this came loose and did not seem to maintain seal very well. He worked for about two days and it did seem to do very well during that time according to his wife but in general this does  not seem to be something that's gonna be beneficial for him long-term. I do believe we need to go back to standard dressings to see if we can find something that will be  of benefit. 03/02/18- He is here in follow up evaluation; there is minimal change in the wound. He will continue with the same treatment plan, would consider changing to iodosrob/iodoflex if ulcer continues to to plateau. He will follow up next week 03/08/18 on evaluation today patient's wound actually appears to be about the same size as when I previously saw him several weeks back. Unfortunately he does have some slightly dark discoloration in the central portion of the wound which has me concerned about pressure injury. I do believe he may be sitting for too long a period of time in fact he tells me that "I probably sit for much too long". He does have some Slough noted on the surface of the wound and again as far as the size of the wound is concerned I'm really not seeing anything that seems to have improved significantly. 03/15/18 on evaluation today patient appears to be doing fairly well in regard to his ulcer. The wound measured pretty much about the same today compared to last week's evaluation when looking at his graph. With that being said the area of bruising/deep tissue injury that was noted last week I do not see at this point. He did get a new cushion fortunately this does seem to be have been of benefit in my pinion. It does appear that he's been off of this more which is good news as well I think that is definitely showing in the overall wound measurements. With that being said I do believe that he needs to continue to offload I don't think that the fact this is doing better should be or is going to allow him to not have to offload and explain this to him as well. Overall he seems to be in agreement the plan I think he understands. The overall appearance of the wound bed is improved compared to last week I think the Iodoflex has been beneficial in that regard. 03/29/18 on evaluation today patient actually appears to be doing rather well in regard to his wound from the overall  appearance standpoint he does have some granulation although there's some Slough on the surface of the wound noted as well. With that being said he unfortunately has not improved in regard to the overall measurement of the wound in volume or in size. I did have a discussion with him very specifically about offloading today. He actually does work although he mainly is just sitting throughout the day. He tells me he offloads by "lifting himself up for 30 seconds off of his chair occasionally" purchase from [facility] which does seem to have helped. And he has a new cushion that he with that being said he's also able to stand some for a very short period of time but not significant enough I think to provide appropriate offloading. I think the biggest issue at this point with the wound and the fact is not healing as quickly as we would like is due to the fact that he is really not able to appropriately offload while at work. He states the beginning after his injury he actually had a bed at his job that he could lay on in order to offload and that does seem to have been of help back at that time. Nonetheless he had not done this  in quite some time unfortunately. I think that could be helpful for him this is something I would like for him to look into. 04/05/18 on evaluation today patient actually presents for follow-up concerning his right gluteal ulcer. Again he really is not significantly improved even compared to last week. He has been tolerating the dressing changes without complication. With that being said fortunately there appears to be no evidence of infection at this time. He has been more proactive in trying to offload. KOY, LAMP ([id number]) Electronic Signature(s) Signed: 04/05/2018 12:10:32 PM By: Worthy Keeler [name] Entered By: Worthy Keeler on 04/05/2018 10:24:24 BENEDETTO, RYDER  ([id number]) -------------------------------------------------------------------------------- Physical Exam Details Patient Name: [name] Date of Service: 04/05/2018 10:00 AM Medical Record Number: [id number] Patient Account Number: [id number] Date of Birth/Sex: 08/14/1950 (67 y.o. M) Treating RN: [name] Primary Care Provider: [name] Other Clinician: Referring Provider: [name] Treating Provider/Extender: [name], HOYT Weeks in Treatment: 6 Constitutional Well-nourished and well-hydrated in no acute distress. Respiratory normal breathing without difficulty. Psychiatric this patient is able to make decisions and demonstrates good insight into disease process. Alert and Oriented x 3. pleasant and cooperative. Notes At this point patient's wound actually is shown signs of maintaining about the same at this point. Fortunately there appears to be no worsening but is also not significant improvement. He has been approved for the oasis and since over the past four visits he really has not shown dramatic improvement I think is definitely appropriate to initiate this if you would like to. The patient is willing and wanting to do this. Therefore we did proceed with the first oasis application. This was performed after sharp debridement to compare the wound bed for application. Electronic Signature(s) Signed: 04/05/2018 12:10:32 PM By: Worthy Keeler [name] Entered By: Worthy Keeler on 04/05/2018 10:25:48 FACUNDO, ALLEMAND ([id number]) -------------------------------------------------------------------------------- Physician Orders Details Patient Name: [name] Date of Service: 04/05/2018 10:00 AM Medical Record Number: [id number] Patient Account Number: [id number] Date of Birth/Sex: 1950/08/22 (67 y.o. 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 Skin Barriers/Peri-Wound Care Wound #1 Right Gluteal fold o Skin Prep Dressing Change Frequency Wound #1 Right Gluteal fold o Change dressing every week - outer dressing may be changed if it gets wet. Follow-up Appointments Wound #1 Right Gluteal fold o Return Appointment in 1 week. Off-Loading Wound #1 Right Gluteal fold o Other: - Keep pressure off of area [facility] Wound #1 Right Gluteal fold o Oasis application in clinic; including contact layer, fixation with steri strips, dry gauze and cover dressing. Electronic Signature(s) Signed: 04/05/2018 12:10:32 PM By: Worthy Keeler [name] Signed: 04/05/2018 4:52:26 PM By: Gretta Cool, BSN, RN, CWS, Kim RN, BSN Entered By: Gretta Cool, BSN, RN, CWS, Kim on 04/05/2018 10:06:41 SHAMOND, SKELTON ([id number]) MORDECAI, TINDOL ([id number]) -------------------------------------------------------------------------------- Problem List Details Patient Name: [name] Date of Service: 04/05/2018 10:00 AM Medical Record Number: [id number] Patient Account  Number: [id number] Date of Birth/Sex: 1950-10-23 (67 y.o. 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 He states that there is no history of diabetes that he is aware of. He does have issues with his prostate and is currently receiving treatment for this by way of oral medication. With that being said I do not have a lot of details in that regard. Nonetheless the patient presents today as a result of having been referred to [place] by another provider initially home health was set to come out and take care of his wound although due to the fact that he apparently drives he's not able to receive home health. His wife is therefore trying to help take care of this  wound within although they have been struggling with what exactly to do at this point. She states that she can do some things but she is definitely not a nurse and does have some issues with looking at blood. The good news is the wound does not appear to be too deep and is fairly superficial at this point. There is no slough noted there is some nonviable skin noted around the surface of the wound and the perimeter at this point. The central portion of the wound appears to be very good with a dermal layer noted this does not appear to be again deep enough to extend it to subcutaneous tissue at this point. 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 In the pump he has Dilaudid and baclofen. 12/07/17 on evaluation today patient presents for follow-up concerning his ongoing lower back thigh ulcer on the right. He states that he did not get the supplies ordered and therefore has not really been able to perform the dressing changes as directed exactly. His wife was able to get some Boarder Foam Dressing's from the drugstore and subsequently has been using hydrogel which did help to a degree in the wound does appear to be able smaller. There is actually more drainage this week noted than previous. 12/21/17 on evaluation today patient appears to be doing rather well in regard to his right gluteal ulcer. He has been tolerating the dressing changes without complication. There does not appear  to be any evidence of infection at this point in time. Overall the wound does seem to be making some progress as far as the edges are concerned there's not as much in the way of overlapping of the external wound edges and he has a good epithelium to wound bed border for the most part. This however is not true right at the 12 o'clock location over the span of a little over a centimeters which actually will require  debridement today to clean this away and hopefully allow it to continue to heal more appropriately. 12/28/17 on evaluation today patient appears to be doing rather well in regard to his ulcer in the left gluteal region. He's been tolerating the dressing changes without complication. Apparently he has had some difficulty getting his dressing material. Apparently there's been some confusion with ordering we're gonna check into this. Nonetheless overall he's been showing signs of improvement which is good news. Debridement is not required today. 01/04/18 on evaluation today patient presents for follow-up concerning his right gluteal ulcer. He has been tolerating the dressing changes fairly well. On inspection today it appears he may actually have some maceration them concerned about the fact that he may be developing too much moisture in and around the wound bed which can cause delay in healing. With that being said he unfortunately really has not showed significant signs of improvement since last week's evaluation in fact this may even be just the little bit/slightly larger. Nonetheless he's been having a lot of discomfort I'm not sure this is even related to the wound as he has no pain when I'm to breeding or otherwise cleaning the wound during evaluation today. Nonetheless this is something that we did recommend he talked to his pain specialist concerning. 01/11/18 on evaluation today patient appears to be doing better in regard to his ulceration. He has been tolerating the dressing Platter, Johanan J. ([id number]) changes without complication. With that being said overall there's no evidence of infection which is good news. The only thing is he did receive the hatch affair blue classic versus the ready nonetheless I feel like this is perfectly fine and appears to have done well for him over the past week. 01/25/18 on evaluation today patient's wound actually appears to be a little bit larger than  during the last evaluation. The good news is the majority of the wound edges actually appear to be fairly firmly attached to the wound bed unfortunately again we're not really making progress in regard to the size. Roughly the wound is about the same size as when I first saw him although again the wound margin/edges appear to be much better. 02/01/18 on evaluation today patient actually appears to be doing very well in regard to his wound. Applying the Prisma dry does seem to be better although he does still have issues with slow progression of the wound. There was a slight improvement compared to last week's measurements today. Nonetheless I have been considering other options as far as the possibility of Theraskin or even a snap vac. In general I'm not sure that the Theraskin due to location of the wound would be a very good idea. Nonetheless I do think that a snap vac could be a possibility for the patient and in fact I think this could even be an excellent way to manage the wound possibly seeing some improvement in a very rapid fashion here. Nonetheless this is something that we would need to get approved and I did have  a lengthy conversation with the patient about this today. 02/08/18 on evaluation today patient appears to be doing a little better in regard to his ulcer. He has been tolerating the dressing changes without complication. Fortunately despite the fact that the wound is a little bit smaller it's not significantly so unfortunately. We have discussed the possibility of a snap vac we did check with insurance this is actually covered at this point. Fortunately there does not appear to be any sign of infection. Overall I'm fairly pleased with how things seem to be appearing at this point. 02/15/18 on evaluation today patient appears to be doing rather well in regard to his right gluteal ulcer. Unfortunately the snap vac did not stay in place with his sheer and friction this came loose and  did not seem to maintain seal very well. He worked for about two days and it did seem to do very well during that time according to his wife but in general this does not seem to be something that's gonna be beneficial for him long-term. I do believe we need to go back to standard dressings to see if we can find something that will be of benefit. 03/02/18- He is here in follow up evaluation; there is minimal change in the wound. He will continue with the same treatment plan, would consider changing to iodosrob/iodoflex if ulcer continues to to plateau. He will follow up next week 03/08/18 on evaluation today patient's wound actually appears to be about the same size as when I previously saw him several weeks back. Unfortunately he does have some slightly dark discoloration in the central portion of the wound which has me concerned about pressure injury. I do believe he may be sitting for too long a period of time in fact he tells me that "I probably sit for much too long". He does have some Slough noted on the surface of the wound and again as far as the size of the wound is concerned I'm really not seeing anything that seems to have improved significantly. 03/15/18 on evaluation today patient appears to be doing fairly well in regard to his ulcer. The wound measured pretty much about the same today compared to last week's evaluation when looking at his graph. With that being said the area of bruising/deep tissue injury that was noted last week I do not see at this point. He did get a new cushion fortunately this does seem to be have been of benefit in my pinion. It does appear that he's been off of this more which is good news as well I think that is definitely showing in the overall wound measurements. With that being said I do believe that he needs to continue to offload I don't think that the fact this is doing better should be or is going to allow him to not have to offload and explain this to him  as well. Overall he seems to be in agreement the plan I think he understands. The overall appearance of the wound bed is improved compared to last week I think the Iodoflex has been beneficial in that regard. 03/29/18 on evaluation today patient actually appears to be doing rather well in regard to his wound from the overall appearance standpoint he does have some granulation although there's some Slough on the surface of the wound noted as well. With that being said he unfortunately has not improved in regard to the overall measurement of the wound in volume or in size. I did  have a discussion with him very specifically about offloading today. He actually does work although he mainly is just sitting throughout the day. He tells me he offloads by "lifting himself up for 30 seconds off of his chair occasionally" purchase from [employer] which does seem to have helped. And he has a new cushion that he with that being said he's also able to stand some for a very short period of time but not significant enough I think to provide appropriate offloading. I think the biggest issue at this point with the wound and the fact is not healing as quickly as we would like is due to the fact that he is really not able to appropriately offload while at work. He states the beginning after his injury he actually had a bed at his job that he could lay on in order to offload and that does seem to have been of help back at that time. Nonetheless he had not done this in quite some time unfortunately. I think that could be helpful for him this is something I would like for him to look into. KHRISTOPHER, KAPAUN ([id number]) 04/05/18 on evaluation today patient actually presents for follow-up concerning his right gluteal ulcer. Again he really is not significantly improved even compared to last week. He has been tolerating the dressing changes without complication. With that being said fortunately there appears to be no  evidence of infection at this time. He has been more proactive in trying to offload. Patient History Information obtained from Patient. Family History Hypertension - Father, Stroke - Mother, No family history of Cancer, Diabetes, Heart Disease, Kidney Disease, Lung Disease, Seizures, Thyroid Problems, Tuberculosis. Social History Never smoker, Marital Status - Married, Alcohol Use - Never, Drug Use - No History, Caffeine Use - Daily. Medical And Surgical History Notes Oncologic Prostate cancer- currently treated with horomone therapy Review of Systems (ROS) Constitutional Symptoms (General Health) Denies complaints or symptoms of Fever, Chills. Respiratory The patient has no complaints or symptoms. Cardiovascular The patient has no complaints or symptoms. Psychiatric The patient has no complaints or symptoms. Objective Constitutional Well-nourished and well-hydrated in no acute distress. Vitals Time Taken: 9:37 AM, Height: 73 in, Weight: 210 lbs, BMI: 27.7, Temperature: 98.3 F, Pulse: 57 bpm, Respiratory Rate: 18 breaths/min, Blood Pressure: 118/57 mmHg. Respiratory normal breathing without difficulty. Psychiatric this patient is able to make decisions and demonstrates good insight into disease process. Alert and Oriented x 3. pleasant and cooperative. General Notes: At this point patient's wound actually is shown signs of maintaining about the same at this point. Fortunately there appears to be no worsening but is also not significant improvement. He has been approved for the oasis and since over the past four visits he really has not shown dramatic improvement I think is definitely appropriate to initiate this if you would VON, QUINTANAR. ([id number]) like to. The patient is willing and wanting to do this. Therefore we did proceed with the first oasis application. This was performed after sharp debridement to compare the wound bed for application. Integumentary (Hair,  Skin) Wound #1 status is Open. Original cause of wound was Pressure Injury. The wound is located on the Right Gluteal fold. The wound measures 4.9cm length x 3.2cm width x 0.2cm depth; 12.315cm^2 area and 2.463cm^3 volume. There is Fat Layer (Subcutaneous Tissue) Exposed exposed. There is no tunneling or undermining noted. There is a medium amount of serous drainage noted. The wound margin is flat and intact. There is medium (  34-66%) red, pink granulation within the wound bed. There is a medium (34-66%) amount of necrotic tissue within the wound bed including Adherent Slough. The periwound skin appearance exhibited: Maceration. The periwound skin appearance did not exhibit: Callus, Crepitus, Excoriation, Induration, Rash, Scarring, Dry/Scaly, Atrophie Blanche, Cyanosis, Ecchymosis, Hemosiderin Staining, Mottled, Pallor, Rubor, Erythema. Periwound temperature was noted as No Abnormality. 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 There was a Excisional Skin/Subcutaneous Tissue Debridement with a total area of 15.68 sq cm performed by [name], HOYT E., [name]. With the following instrument(s): Curette to remove Viable and Non-Viable tissue/material. Material removed includes Subcutaneous Tissue, Slough, and Skin: Dermis after achieving pain control using Other (lidocaine 4%). No specimens were taken. A time out was conducted at 09:59, prior to the start of the procedure. A Minimum amount of bleeding was controlled with Pressure. The procedure was tolerated well with a pain level of 0 throughout and a pain level of 0 following the procedure. Post Debridement Measurements: 4.9cm length x 3.2cm width x 0.3cm depth; 3.695cm^3  volume. Post debridement Stage noted as Category/Stage III. Character of Wound/Ulcer Post Debridement is stable. Post procedure Diagnosis Wound #1: Same as Pre-Procedure Pre-procedure diagnosis of Wound #1 is a Pressure Ulcer located on the Right Gluteal fold. A skin graft procedure using a bioengineered skin substitute/cellular or tissue based product was performed by [name], HOYT E., [name] with the following instrument(s): Forceps. Oasis wound matrix was applied and secured with Steri-Strips. 11 sq cm of product was utilized and 0 sq cm was wasted. Post Application, Mepitel One was applied. A Time Out was conducted at 10:01, prior to the start of the procedure. The procedure was tolerated well with a pain level of 0 throughout and a pain level of 0 following the procedure. Post procedure Diagnosis Wound #1: Same as Pre-Procedure . SKYLIER, KRETSCHMER ([id number]) Plan Wound Cleansing: Wound #1 Right Gluteal fold: Cleanse wound with mild soap and water Anesthetic (add to Medication List): Wound #1 Right Gluteal fold: Topical Lidocaine 4% cream applied to wound bed prior to debridement (In Clinic Only). Skin Barriers/Peri-Wound Care: Wound #1 Right Gluteal fold: Skin Prep Dressing Change Frequency: Wound #1 Right Gluteal fold: Change dressing every week - outer dressing may be changed if it gets wet. Follow-up Appointments: Wound #1 Right Gluteal fold: Return Appointment in 1 week. Off-Loading: Wound #1 Right Gluteal fold: Other: - Keep pressure off of area Advanced Therapies: Wound #1 Right Gluteal fold: Oasis application in clinic; including contact layer, fixation with steri strips, dry gauze and cover dressing. I am going to suggest currently that we continue two months with the patient weekly at this point we will order oasis for him to have in place next week if we need to initiate this again. He's in agreement the plan. Otherwise will see him back for reevaluation at that  point hopefully the wound bed will be smaller in size. Please see above for specific wound care orders. We will see patient for re-evaluation in 1 week(s) here in the clinic. If anything worsens or changes patient will contact our office for additional recommendations. Electronic Signature(s) Signed: 04/05/2018 12:10:32 PM By: Worthy Keeler [name] Entered By: Worthy Keeler on 04/05/2018 10:26:39 ERNIE, KASLER ([id number]) -------------------------------------------------------------------------------- ROS/PFSH Details Patient Name:  [name] Date of Service: 04/05/2018 10:00 AM Medical Record Number: [id number] Patient Account Number: [id number] Date of Birth/Sex: 1951/04/13 (67 y.o. M) Treating RN: [name] Primary Care Provider: [name] Other Clinician: Referring Provider: [name] Treating Provider/Extender: [name], HOYT Weeks in Treatment: 18 Information Obtained From Patient Wound History Do you currently have one or more open woundso Yes How many open wounds do you currently haveo 1 Approximately how long have you had your woundso 1 month How have you been treating your wound(s) until nowo cream Has your wound(s) ever healed and then re-openedo No Have you had any lab work done in the past montho No Have you tested positive for an antibiotic resistant organism (MRSA, VRE)o No Have you tested positive for osteomyelitis (bone infection)o No Have you had any tests for circulation on your legso No Constitutional Symptoms (General Health) Complaints and Symptoms: Negative for: Fever; Chills Eyes Medical History: Positive for: Cataracts - both removed Negative for: Glaucoma; Optic Neuritis Ear/Nose/Mouth/Throat Medical History: Negative for: Chronic sinus problems/congestion; Middle ear problems Hematologic/Lymphatic Medical History: Negative for: Anemia; Hemophilia; Human Immunodeficiency Virus; Lymphedema Respiratory Complaints and  Symptoms: No Complaints or Symptoms Medical History: Negative for: Aspiration; Asthma; Chronic Obstructive Pulmonary Disease (COPD); Pneumothorax; Sleep Apnea; Tuberculosis Cardiovascular Complaints and Symptoms: No Complaints or Symptoms Medical History: Positive for: Hypertension - takes medication PAYNE, GARSKE. ([id number]) Negative for: Angina; Arrhythmia; Congestive Heart Failure; Coronary Artery Disease; Deep Vein Thrombosis; Hypotension; Myocardial Infarction; Peripheral Arterial Disease; Peripheral Venous Disease; Phlebitis; Vasculitis Gastrointestinal Medical History: Negative for: Cirrhosis ; Colitis; Crohnos; Hepatitis A; Hepatitis B; Hepatitis C Endocrine Medical History: Negative for: Type I Diabetes; Type II Diabetes Genitourinary Medical History: Negative for: End Stage Renal Disease Immunological Medical History: Negative for: Lupus Erythematosus; Raynaudos; Scleroderma Integumentary (Skin) Medical History: Negative for: History of Burn; History of pressure wounds Musculoskeletal Medical History: Negative for: Gout; Rheumatoid Arthritis; Osteoarthritis; Osteomyelitis Neurologic Medical History: Positive for: Paraplegia - waist down Negative for: Dementia; Neuropathy; Quadriplegia; Seizure Disorder Oncologic Medical History: Negative for: Received Chemotherapy; Received Radiation Past Medical History Notes: Prostate cancer- currently treated with horomone therapy Psychiatric Complaints and Symptoms: No Complaints or Symptoms Medical History: Negative for: Anorexia/bulimia; Confinement Anxiety HBO Extended History Items Eyes: Cataracts Immunizations TASMAN, ZAPATA ([id number]) Pneumococcal Vaccine: Received Pneumococcal Vaccination: No Implantable Devices Family and Social History Cancer: No; Diabetes: No; Heart Disease: No; Hypertension: Yes - Father; Kidney Disease: No; Lung Disease: No; Seizures: No; Stroke: Yes - Mother; Thyroid  Problems: No; Tuberculosis: No; Never smoker; Marital Status - Married; Alcohol Use: Never; Drug Use: No History; Caffeine Use: Daily; Advanced Directives: Yes (Copy provided); Patient does not want information on Advanced Directives; Living Will: Yes (Copy provided) Physician Affirmation I have reviewed and agree with the above information. Electronic Signature(s) Signed: 04/05/2018 12:10:32 PM By: Worthy Keeler [name] Signed: 04/05/2018 4:52:26 PM By: Gretta Cool, BSN, RN, CWS, Kim RN, BSN Entered By: Worthy Keeler on 04/05/2018 10:24:42 DAMIR, LEUNG ([id number]) -------------------------------------------------------------------------------- SuperBill Details Patient Name: [name] Date of Service: 04/05/2018 Medical Record Number: [id number] Patient Account Number: [id number] Date of Birth/Sex: [birth year] (67 y.o. 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, sequela I10 Essential (primary) hypertension Facility Procedures CPT4: Description Modifier Quantity  Code [id number] Q4102- Dermal substitute tissue/non-human origin w metabolically active elements- 11 Oasis (Wound Matrix)product applied per sq cm CPT4: 59136859 (Facility Use Only) C5271 - 1ST 25SQCM TO SM AR TRK,ARMS LEGS 1 ICD-10 Diagnosis Description 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, stage 3 Physician Procedures CPT4: Description Modifier Quantity Code [phone] (Facility Use Only) Application of skin substitute graft to trunk, arms, legs, total wound 1 surface area up to 100 sq cm; first 25 sqcm or less wound surface area ICD-10 Diagnosis Description L89.313  Pressure ulcer  of right buttock, stage 3 Electronic Signature(s) Signed: 04/05/2018 12:10:32 PM By: Worthy Keeler [name] Previous Signature: 04/05/2018 10:18:35 AM Version By: Gretta Cool, BSN, RN, CWS, Kim RN, BSN Entered By: Worthy Keeler on 04/05/2018 10:28:32

## 2018-04-12 ENCOUNTER — Encounter: Payer: Medicare Other | Admitting: Physician Assistant

## 2018-04-12 DIAGNOSIS — L89313 Pressure ulcer of right buttock, stage 3: Secondary | ICD-10-CM | POA: Diagnosis not present

## 2018-04-14 NOTE — Progress Notes (Signed)
TORE, CARREKER ([id number]) Visit Report for 04/12/2018 Chief Complaint Document Details Patient Name: Brett, [name] Date of Service: 04/12/2018 10:00 AM Medical Record Number: [id number] Patient Account Number: [id number] Date of Birth/Sex: 1951-04-20 (67 y.o. M) Treating RN: Roger Shelter Primary Care Provider: [name] Other Clinician: Referring Provider: [name] Treating Provider/Extender: [name], Makailee Nudelman Weeks in Treatment: 19 Information Obtained from: Patient Chief Complaint Right gluteal fold ulcer Electronic Signature(s) Signed: 04/13/2018 9:18:02 AM By: Worthy Keeler [name] Entered By: Worthy Keeler on 04/12/2018 10:29:08 [name], [name] ([id number]) -------------------------------------------------------------------------------- Debridement Details Patient Name: [name] Date of Service: 04/12/2018 10:00 AM Medical Record Number: [id number] Patient Account Number: [id number] Date of Birth/Sex: 1951/02/11 (67 y.o. M) Treating RN: Roger Shelter Primary Care Provider: [name] Other Clinician: Referring Provider: [name] Treating Provider/Extender: [name], Allicia Culley Weeks in Treatment: 19 Debridement Performed for Wound #1 Right Gluteal fold Assessment: Performed By: Physician [name], Kiyoko Mcguirt E., [name] Debridement Type: Debridement Level of Consciousness (Pre- Awake and Alert procedure): Pre-procedure Verification/Time Yes - 10:20 Out Taken: Start Time: 10:20 Pain Control: Lidocaine 4% Topical Solution Total Area Debrided (L x W): 4.5 (cm) x 3.2 (cm) = 14.4 (cm) Tissue and other material Viable, Non-Viable, Slough, Subcutaneous, Biofilm, Fibrin/Exudate, Slough debrided: Level: Skin/Subcutaneous Tissue Debridement Description: Excisional Instrument: Curette Bleeding: Minimum Hemostasis Achieved: Pressure End Time: 10:22 Procedural Pain: 0 Post Procedural Pain: 0 Response to Treatment:  Procedure was tolerated well Level of Consciousness Awake and Alert (Post-procedure): Post Debridement Measurements of Total Wound Length: (cm) 4.5 Stage: Category/Stage III Width: (cm) 3.2 Depth: (cm) 0.2 Volume: (cm) 2.262 Character of Wound/Ulcer Post Improved Debridement: Post Procedure Diagnosis Same as Pre-procedure Electronic Signature(s) Signed: 04/13/2018 9:18:02 AM By: Worthy Keeler [name] Signed: 04/13/2018 10:33:34 AM By: Roger Shelter Entered By: Worthy Keeler on 04/12/2018 10:34:36 [name], [name] ([id number]) -------------------------------------------------------------------------------- HPI Details Patient Name: [name] Date of Service: 04/12/2018 10:00 AM Medical Record Number: [id number] Patient Account Number: [id number] Date of Birth/Sex: October 26, 1950 (67 y.o. 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 He states that there is no history of diabetes that he is aware of. He does have issues with his prostate and is currently receiving treatment for this by way of oral medication. With that being said I do not have a lot of details in that regard. Nonetheless the patient presents today as a result of having been referred to [place] by another provider initially home health was set to come out and take care of his wound although due to the fact that he apparently drives he's not able to receive home health. His wife is therefore trying to help take care of this wound within although they have  been struggling with what exactly to do at this point. She states that she can do some things but she is definitely not a nurse and does have some issues with looking at blood. The good news is the wound does not appear to be too deep and is fairly superficial at this point. There is no slough noted there is some nonviable skin noted  around the surface of the wound and the perimeter at this point. The central portion of the wound appears to be very good with a dermal layer noted this does not appear to be again deep enough to extend it to subcutaneous tissue at this point. 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 In the pump he has Dilaudid and baclofen. 12/07/17 on evaluation today patient presents for follow-up concerning his ongoing lower back thigh ulcer on the right. He states that he did not get the supplies ordered and therefore has not really been able to perform the dressing changes as directed exactly. His wife was able to get some Boarder Foam Dressing's from the drugstore and subsequently has been using hydrogel which did help to a degree in the wound does appear to be able smaller. There is actually more drainage this week noted than previous. 12/21/17 on evaluation today patient appears to be doing rather well in regard to his right gluteal ulcer. He has been tolerating the dressing changes without complication. There does not appear to be any evidence of infection at this point in time. Overall the wound does seem to be making some progress as far as the edges are concerned there's not as much in the way of overlapping of the external wound edges and he has a good epithelium to wound bed border for the most part. This however is not true right at the 12 o'clock location over the span of a little over a centimeters which actually will require debridement today to clean this away  and hopefully allow it to continue to heal more appropriately. 12/28/17 on evaluation today patient appears to be doing rather well in regard to his ulcer in the left gluteal region. He's been tolerating the dressing changes without complication. Apparently he has had some difficulty getting his dressing material. Apparently there's been some confusion with ordering we're gonna check into this. Nonetheless overall he's been showing signs of improvement which is good news. Debridement is not required today. 01/04/18 on evaluation today patient presents for follow-up concerning his right gluteal ulcer. He has been tolerating the dressing changes fairly well. On inspection today it appears he may actually have some maceration them concerned about the fact that he may be developing too much moisture in and around the wound bed which can cause delay in healing. With that being said he unfortunately really has not showed significant signs of improvement since last week's evaluation in fact this may even be just the little bit/slightly larger. Nonetheless he's been having a lot of discomfort I'm not sure this is even related to the wound as he has no pain when I'm to breeding or otherwise cleaning the wound during evaluation today. Nonetheless this is something that we did recommend he talked to his pain specialist concerning. 01/11/18 on evaluation today patient appears to be doing better in regard to his ulceration. He has been tolerating the dressing changes without complication. With that being said overall there's no evidence of infection which is good news. The only thing is he did receive the hatch affair blue classic versus the ready nonetheless I feel like this is perfectly fine and appears to have done well for him over the past week. 01/25/18 on evaluation today patient's wound actually appears to be a little bit larger than during the last evaluation. The good [name], [name]. ([id number]) news  is the majority of the wound edges actually appear to be fairly firmly attached to the wound bed unfortunately again we're not really making progress in regard to the size. Roughly the wound is about the same size as when I first saw him although again the wound margin/edges appear to be much better. 02/01/18 on evaluation today patient actually appears to be doing very well in regard to his wound. Applying the Prisma dry does seem to be better although he does still have issues with slow progression of the wound. There was a slight improvement compared to last week's measurements today. Nonetheless I have been considering other options as far as the possibility of Theraskin or even a snap vac. In general I'm not sure that the Theraskin due to location of the wound would be a very good idea. Nonetheless I do think that a snap vac could be a possibility for the patient and in fact I think this could even be an excellent way to manage the wound possibly seeing some improvement in a very rapid fashion here. Nonetheless this is something that we would need to get approved and I did have a lengthy conversation with the patient about this today. 02/08/18 on evaluation today patient appears to be doing a little better in regard to his ulcer. He has been tolerating the dressing changes without complication. Fortunately despite the fact that the wound is a little bit smaller it's not significantly so unfortunately. We have discussed the possibility of a snap vac we did check with insurance this is actually covered at this point. Fortunately there does not appear to be any sign of infection. Overall I'm fairly pleased with how things seem to be appearing at this point. 02/15/18 on evaluation today patient appears to be doing rather well in regard to his right gluteal ulcer. Unfortunately the snap vac did not stay in place with his sheer and friction this came loose and did not seem to maintain seal very well. He  worked for about two days and it did seem to do very well during that time according to his wife but in general this does not seem to be something that's gonna be beneficial for him long-term. I do believe we need to go back to standard dressings to see if we can find something that will be of benefit. 03/02/18- He is here in follow up evaluation; there is minimal change in the wound. He will continue with the same treatment plan, would consider changing to iodosrob/iodoflex if ulcer continues to to plateau. He will follow up next week 03/08/18 on evaluation today patient's wound actually appears to be about the same size as when I previously saw him several weeks back. Unfortunately he does have some slightly dark discoloration in the central portion of the wound which has me concerned about pressure injury. I do believe he may be sitting for too long a period of time in fact he tells me that "I probably sit for much too long". He does have some Slough noted on the surface of the wound and again as far as the size of the wound is concerned I'm really not seeing anything that seems to have improved significantly. 03/15/18 on evaluation today patient appears to be doing fairly well in regard to his ulcer. The wound measured pretty much about the same today compared to last week's evaluation when looking at his graph. With that being said the area of bruising/deep tissue injury that was noted last week I  do not see at this point. He did get a new cushion fortunately this does seem to be have been of benefit in my pinion. It does appear that he's been off of this more which is good news as well I think that is definitely showing in the overall wound measurements. With that being said I do believe that he needs to continue to offload I don't think that the fact this is doing better should be or is going to allow him to not have to offload and explain this to him as well. Overall he seems to be in agreement  the plan I think he understands. The overall appearance of the wound bed is improved compared to last week I think the Iodoflex has been beneficial in that regard. 03/29/18 on evaluation today patient actually appears to be doing rather well in regard to his wound from the overall appearance standpoint he does have some granulation although there's some Slough on the surface of the wound noted as well. With that being said he unfortunately has not improved in regard to the overall measurement of the wound in volume or in size. I did have a discussion with him very specifically about offloading today. He actually does work although he mainly is just sitting throughout the day. He tells me he offloads by "lifting himself up for 30 seconds off of his chair occasionally" purchase from [employer] which does seem to have helped. And he has a new cushion that he with that being said he's also able to stand some for a very short period of time but not significant enough I think to provide appropriate offloading. I think the biggest issue at this point with the wound and the fact is not healing as quickly as we would like is due to the fact that he is really not able to appropriately offload while at work. He states the beginning after his injury he actually had a bed at his job that he could lay on in order to offload and that does seem to have been of help back at that time. Nonetheless he had not done this in quite some time unfortunately. I think that could be helpful for him this is something I would like for him to look into. 04/05/18 on evaluation today patient actually presents for follow-up concerning his right gluteal ulcer. Again he really is not significantly improved even compared to last week. He has been tolerating the dressing changes without complication. With that being said fortunately there appears to be no evidence of infection at this time. He has been more proactive in trying  to offload. BUD, KAESER ([id number]) 04/12/18 on evaluation today patient actually appears to be doing a little better in regard to his wound and the right gluteal fold region. He's been tolerating the dressing changes since removing the oasis without complication. However he was having a lot of burning initially with the oasis in place. He's unsure of exactly why this was given so much discomfort but he assumes that it was the oasis itself causing the problem. Nonetheless this had to be removed after about three days in place although even those three days seem to have made a fairly good improvement in regard to the overall appearance of the wound bed. In fact is the first time that he's made any improvement from the standpoint of measurements in about six weeks. He continues to have no discomfort over the area of the wound itself which leads me to  wonder why he was having the burning with the oasis when he does not even feel the actual debridement's themselves. I am somewhat perplexed by this. Electronic Signature(s) Signed: 04/13/2018 9:18:02 AM By: Worthy Keeler [name] Entered By: Worthy Keeler on 04/12/2018 10:30:37 ARLIND, KLINGERMAN ([id number]) -------------------------------------------------------------------------------- Physical Exam Details Patient Name: [name] Date of Service: 04/12/2018 10:00 AM Medical Record Number: [id number] Patient Account Number: [id number] Date of Birth/Sex: [birth year] (67 y.o. M) Treating RN: Roger Shelter Primary Care Provider: [name] Other Clinician: Referring Provider: [name] Treating Provider/Extender: [name], Osborn Pullin Weeks in Treatment: 46 Constitutional Well-nourished and well-hydrated in no acute distress. Respiratory normal breathing without difficulty. Psychiatric this patient is able to make decisions and demonstrates good insight into disease process. Alert and Oriented x 3. pleasant and  cooperative. Notes Currently patient's wound bed actually shows evidence of good improvement at this time compared to previous he does have some minimal slough noted on the surface of the wound along with biofilm this was sharply debrided away the wound bed appears to be doing significantly better post debridement. Electronic Signature(s) Signed: 04/13/2018 9:18:02 AM By: Worthy Keeler [name] Entered By: Worthy Keeler on 04/12/2018 10:31:09 CALUB, TARNOW ([id number]) -------------------------------------------------------------------------------- Physician Orders Details Patient Name: [name] Date of Service: 04/12/2018 10:00 AM Medical Record Number: [id number] Patient Account Number: [id number] Date of Birth/Sex: 1950/08/07 (67 y.o. M) Treating RN: Roger Shelter Primary Care Provider: [name] Other Clinician: Referring Provider: [name] Treating Provider/Extender: [name], Lidwina Kaner Weeks in Treatment: 35 Verbal / Phone Orders: No Diagnosis Coding Wound Cleansing Wound #1 Right Gluteal fold o Cleanse wound with mild soap and water Anesthetic (add to Medication List) Wound #1 Right Gluteal fold o Topical Lidocaine 4% cream applied to wound bed prior to debridement (In Clinic Only). Skin Barriers/Peri-Wound Care Wound #1 Right Gluteal fold o Skin Prep Primary Wound Dressing Wound #1 Right Gluteal fold o Other: - endoform, mepitel, steri strips Secondary Dressing Wound #1 Right Gluteal fold o Boardered Foam Dressing Dressing Change Frequency Wound #1 Right Gluteal fold o Change dressing every week - outer dressing may be changed if it gets wet. If dressing comes off may replace with endoform Follow-up Appointments Wound #1 Right Gluteal fold o Return Appointment in 1 week. Off-Loading Wound #1 Right Gluteal fold o Other: - Keep pressure off of area Electronic Signature(s) Signed: 04/13/2018 9:18:02 AM By: Worthy Keeler [name] Signed: 04/13/2018 10:33:34 AM By: Roger Shelter Entered By: Roger Shelter on 04/12/2018 10:15:48 NAYEF, COLLEGE ([id number]) -------------------------------------------------------------------------------- Problem List Details Patient Name: [name] Date of Service: 04/12/2018 10:00 AM Medical Record Number: [id number] Patient Account Number: [id number] Date of Birth/Sex: [birth year] (67 y.o. 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 He states that there is no history of diabetes that he is aware of. He does have issues with his prostate and is currently receiving treatment for this by way of oral medication. With that being said I do not have a lot of details in that regard. Nonetheless the patient presents today as a result of having been referred to [place] by another provider initially home health was set to come out and take care of his wound although due to the fact that he apparently drives he's not able to receive home health. His wife is therefore trying to help take care of this wound within although they have been struggling with what exactly to do at this point. She states that she can do some things but she is definitely not a nurse and does have some issues with looking at blood. The good news is the wound does not appear to be too deep and is fairly superficial at this point. There is no slough noted there is some nonviable skin noted around the surface of the wound and the perimeter at this point. The central portion of the wound appears to be very good with a dermal layer noted this does not appear to be again deep enough to extend it to subcutaneous tissue at this point. 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 In the pump he has Dilaudid and baclofen. 12/07/17 on evaluation today patient presents for follow-up concerning his ongoing lower back thigh ulcer on the right. He states that he did not get the supplies ordered and therefore has not really been able to perform the dressing changes as directed exactly. His wife was able to get some Boarder Foam Dressing's from the drugstore and subsequently has been using hydrogel which did help to a degree in the wound does appear to be able smaller.  There is actually more drainage this week noted than previous. 12/21/17 on evaluation today patient appears to be doing rather well in regard to his right gluteal ulcer. He has been tolerating the dressing changes without complication. There does not appear to be any evidence of infection at this point in time. Overall the wound does seem to be making some progress as far as the edges are concerned there's not as much in the way of overlapping of the external wound edges and he has a good epithelium to wound bed border for the most part. This however is not true right at the 12 o'clock location over the span of a little over a centimeters which actually will require debridement today to clean this away and hopefully allow it to continue to heal more appropriately. 12/28/17 on evaluation today patient appears to be doing rather well in regard to his ulcer in the left gluteal region. He's been tolerating the dressing changes without complication. Apparently he has had some difficulty getting his dressing material. Apparently there's been some confusion with  ordering we're gonna check into this. Nonetheless overall he's been showing signs of improvement which is good news. Debridement is not required today. 01/04/18 on evaluation today patient presents for follow-up concerning his right gluteal ulcer. He has been tolerating the dressing changes fairly well. On inspection today it appears he may actually have some maceration them concerned about the fact that he may be developing too much moisture in and around the wound bed which can cause delay in healing. With that being said he unfortunately really has not showed significant signs of improvement since last week's evaluation in fact this may even be just the little bit/slightly larger. Nonetheless he's been having a lot of discomfort I'm not sure this is even related to the wound as he has no pain when I'm to breeding or otherwise cleaning the wound  during evaluation today. Nonetheless this is something that we did recommend he talked to his pain specialist concerning. 01/11/18 on evaluation today patient appears to be doing better in regard to his ulceration. He has been tolerating the dressing Konz, Travers J. ([id number]) changes without complication. With that being said overall there's no evidence of infection which is good news. The only thing is he did receive the hatch affair blue classic versus the ready nonetheless I feel like this is perfectly fine and appears to have done well for him over the past week. 01/25/18 on evaluation today patient's wound actually appears to be a little bit larger than during the last evaluation. The good news is the majority of the wound edges actually appear to be fairly firmly attached to the wound bed unfortunately again we're not really making progress in regard to the size. Roughly the wound is about the same size as when I first saw him although again the wound margin/edges appear to be much better. 02/01/18 on evaluation today patient actually appears to be doing very well in regard to his wound. Applying the Prisma dry does seem to be better although he does still have issues with slow progression of the wound. There was a slight improvement compared to last week's measurements today. Nonetheless I have been considering other options as far as the possibility of Theraskin or even a snap vac. In general I'm not sure that the Theraskin due to location of the wound would be a very good idea. Nonetheless I do think that a snap vac could be a possibility for the patient and in fact I think this could even be an excellent way to manage the wound possibly seeing some improvement in a very rapid fashion here. Nonetheless this is something that we would need to get approved and I did have a lengthy conversation with the patient about this today. 02/08/18 on evaluation today patient appears to be doing a  little better in regard to his ulcer. He has been tolerating the dressing changes without complication. Fortunately despite the fact that the wound is a little bit smaller it's not significantly so unfortunately. We have discussed the possibility of a snap vac we did check with insurance this is actually covered at this point. Fortunately there does not appear to be any sign of infection. Overall I'm fairly pleased with how things seem to be appearing at this point. 02/15/18 on evaluation today patient appears to be doing rather well in regard to his right gluteal ulcer. Unfortunately the snap vac did not stay in place with his sheer and friction this came loose and did not seem to maintain seal very  well. He worked for about two days and it did seem to do very well during that time according to his wife but in general this does not seem to be something that's gonna be beneficial for him long-term. I do believe we need to go back to standard dressings to see if we can find something that will be of benefit. 03/02/18- He is here in follow up evaluation; there is minimal change in the wound. He will continue with the same treatment plan, would consider changing to iodosrob/iodoflex if ulcer continues to to plateau. He will follow up next week 03/08/18 on evaluation today patient's wound actually appears to be about the same size as when I previously saw him several weeks back. Unfortunately he does have some slightly dark discoloration in the central portion of the wound which has me concerned about pressure injury. I do believe he may be sitting for too long a period of time in fact he tells me that "I probably sit for much too long". He does have some Slough noted on the surface of the wound and again as far as the size of the wound is concerned I'm really not seeing anything that seems to have improved significantly. 03/15/18 on evaluation today patient appears to be doing fairly well in regard to his  ulcer. The wound measured pretty much about the same today compared to last week's evaluation when looking at his graph. With that being said the area of bruising/deep tissue injury that was noted last week I do not see at this point. He did get a new cushion fortunately this does seem to be have been of benefit in my pinion. It does appear that he's been off of this more which is good news as well I think that is definitely showing in the overall wound measurements. With that being said I do believe that he needs to continue to offload I don't think that the fact this is doing better should be or is going to allow him to not have to offload and explain this to him as well. Overall he seems to be in agreement the plan I think he understands. The overall appearance of the wound bed is improved compared to last week I think the Iodoflex has been beneficial in that regard. 03/29/18 on evaluation today patient actually appears to be doing rather well in regard to his wound from the overall appearance standpoint he does have some granulation although there's some Slough on the surface of the wound noted as well. With that being said he unfortunately has not improved in regard to the overall measurement of the wound in volume or in size. I did have a discussion with him very specifically about offloading today. He actually does work although he mainly is just sitting throughout the day. He tells me he offloads by "lifting himself up for 30 seconds off of his chair occasionally" purchase from [employer] which does seem to have helped. And he has a new cushion that he with that being said he's also able to stand some for a very short period of time but not significant enough I think to provide appropriate offloading. I think the biggest issue at this point with the wound and the fact is not healing as quickly as we would like is due to the fact that he is really not able to appropriately offload while  at work. He states the beginning after his injury he actually had a bed at his job that  he could lay on in order to offload and that does seem to have been of help back at that time. Nonetheless he had not done this in quite some time unfortunately. I think that could be helpful for him this is something I would like for him to look into. [name], [name] ([id number]) 04/05/18 on evaluation today patient actually presents for follow-up concerning his right gluteal ulcer. Again he really is not significantly improved even compared to last week. He has been tolerating the dressing changes without complication. With that being said fortunately there appears to be no evidence of infection at this time. He has been more proactive in trying to offload. 04/12/18 on evaluation today patient actually appears to be doing a little better in regard to his wound and the right gluteal fold region. He's been tolerating the dressing changes since removing the oasis without complication. However he was having a lot of burning initially with the oasis in place. He's unsure of exactly why this was given so much discomfort but he assumes that it was the oasis itself causing the problem. Nonetheless this had to be removed after about three days in place although even those three days seem to have made a fairly good improvement in regard to the overall appearance of the wound bed. In fact is the first time that he's made any improvement from the standpoint of measurements in about six weeks. He continues to have no discomfort over the area of the wound itself which leads me to wonder why he was having the burning with the oasis when he does not even feel the actual debridement's themselves. I am somewhat perplexed by this. Patient History Information obtained from Patient. Family History Hypertension - Father, Stroke - Mother, No family history of Cancer, Diabetes, Heart Disease, Kidney Disease, Lung Disease,  Seizures, Thyroid Problems, Tuberculosis. Social History Never smoker, Marital Status - Married, Alcohol Use - Never, Drug Use - No History, Caffeine Use - Daily. Medical And Surgical History Notes Oncologic Prostate cancer- currently treated with horomone therapy Review of Systems (ROS) Constitutional Symptoms (General Health) Denies complaints or symptoms of Fever, Chills. Respiratory The patient has no complaints or symptoms. Cardiovascular The patient has no complaints or symptoms. Psychiatric The patient has no complaints or symptoms. Objective Constitutional Well-nourished and well-hydrated in no acute distress. Vitals Time Taken: 9:45 AM, Height: 73 in, Weight: 210 lbs, BMI: 27.7, Temperature: 98.7 F, Pulse: 58 bpm, Respiratory Rate: 16 breaths/min, Blood Pressure: 112/80 mmHg. Respiratory normal breathing without difficulty. AADHAV, UHLIG ([id number]) Psychiatric this patient is able to make decisions and demonstrates good insight into disease process. Alert and Oriented x 3. pleasant and cooperative. General Notes: Currently patient's wound bed actually shows evidence of good improvement at this time compared to previous he does have some minimal slough noted on the surface of the wound along with biofilm this was sharply debrided away the wound bed appears to be doing significantly better post debridement. Integumentary (Hair, Skin) Wound #1 status is Open. Original cause of wound was Pressure Injury. The wound is located on the Right Gluteal fold. The wound measures 4.5cm length x 3.2cm width x 0.2cm depth; 11.31cm^2 area and 2.262cm^3 volume. There is Fat Layer (Subcutaneous Tissue) Exposed exposed. There is no tunneling or undermining noted. There is a medium amount of serous drainage noted. The wound margin is flat and intact. There is small (1-33%) red, pink granulation within the wound bed. There is a large (67-100%) amount of necrotic tissue within  the  wound bed including Adherent Slough. The periwound skin appearance did not exhibit: Callus, Crepitus, Excoriation, Induration, Rash, Scarring, Dry/Scaly, Maceration, Atrophie Blanche, Cyanosis, Ecchymosis, Hemosiderin Staining, Mottled, Pallor, Rubor, Erythema. Periwound temperature was noted as No Abnormality. 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 There was a Excisional Skin/Subcutaneous Tissue Debridement with a total area of 14.4 sq cm performed by [name], Nemiah Kissner E., [name]. With the following instrument(s): Curette to remove Viable and Non-Viable tissue/material. Material removed includes Subcutaneous Tissue, Slough, Biofilm, and Fibrin/Exudate after achieving pain control using Lidocaine 4% Topical Solution. No specimens were taken. A time out was conducted at 10:20, prior to the start of the procedure. A Minimum amount of bleeding was controlled with Pressure. The procedure was tolerated well with a pain level of 0 throughout and a pain level of 0 following the procedure. Post Debridement Measurements: 4.5cm length x 3.2cm width x 0.2cm depth; 2.262cm^3 volume. Post debridement Stage noted as Category/Stage III. Character of Wound/Ulcer Post Debridement is improved. Post procedure Diagnosis Wound #1: Same as Pre-Procedure Plan [name], [name] ([id number]) Wound Cleansing: Wound #1 Right Gluteal fold: Cleanse wound with mild soap and water Anesthetic (add to Medication List): Wound #1 Right Gluteal fold: Topical Lidocaine 4% cream applied to wound bed prior to debridement (In Clinic Only). Skin Barriers/Peri-Wound Care: Wound #1 Right Gluteal fold: Skin Prep Primary Wound Dressing: Wound #1 Right  Gluteal fold: Other: - endoform, mepitel, steri strips Secondary Dressing: Wound #1 Right Gluteal fold: Boardered Foam Dressing Dressing Change Frequency: Wound #1 Right Gluteal fold: Change dressing every week - outer dressing may be changed if it gets wet. If dressing comes off may replace with endoform Follow-up Appointments: Wound #1 Right Gluteal fold: Return Appointment in 1 week. Off-Loading: Wound #1 Right Gluteal fold: Other: - Keep pressure off of area At this point as sort of a trial I wanted to test and see whether or not securing an Endoform in place over the wound bed just as I would a skin substitute would cause the burning that he experienced over the past week or not. The patient was in agreement with giving [place] a trial in this regard and since it will be the same dressing essentially that we did before the only thing that will be different is placing Endoform instead of oasis and he has previously had the Endoform in place without complication. I'm also hopeful this will continue with some improvement utilizing the Endoform. The patient is in agreement with the plan. Subsequently we're gonna see were things stand at follow-up. Please see above for specific wound care orders. We will see patient for re-evaluation in 1 week(s) here in the clinic. If anything worsens or changes patient will contact our office for additional recommendations. Electronic Signature(s) Signed: 04/13/2018 9:18:02 AM By: Worthy Keeler [name] Entered By: Worthy Keeler on 04/12/2018 10:34:50 [name], [name] ([id number]) -------------------------------------------------------------------------------- ROS/PFSH Details Patient Name: [name] Date of Service: 04/12/2018 10:00 AM Medical Record Number: [id number] Patient Account Number: [id number] Date of Birth/Sex: 05/14/1951 (67 y.o. M) Treating RN: Roger Shelter Primary Care Provider: [name] Other Clinician: Referring  Provider: [name] Treating Provider/Extender: [name], Brandywine Wenzlick Weeks in Treatment: 62 Information Obtained From Patient Wound History Do you currently have one or more  open woundso Yes How many open wounds do you currently haveo 1 Approximately how long have you had your woundso 1 month How have you been treating your wound(s) until nowo cream Has your wound(s) ever healed and then re-openedo No Have you had any lab work done in the past montho No Have you tested positive for an antibiotic resistant organism (MRSA, VRE)o No Have you tested positive for osteomyelitis (bone infection)o No Have you had any tests for circulation on your legso No Constitutional Symptoms (General Health) Complaints and Symptoms: Negative for: Fever; Chills Eyes Medical History: Positive for: Cataracts - both removed Negative for: Glaucoma; Optic Neuritis Ear/Nose/Mouth/Throat Medical History: Negative for: Chronic sinus problems/congestion; Middle ear problems Hematologic/Lymphatic Medical History: Negative for: Anemia; Hemophilia; Human Immunodeficiency Virus; Lymphedema Respiratory Complaints and Symptoms: No Complaints or Symptoms Medical History: Negative for: Aspiration; Asthma; Chronic Obstructive Pulmonary Disease (COPD); Pneumothorax; Sleep Apnea; Tuberculosis Cardiovascular Complaints and Symptoms: No Complaints or Symptoms Medical History: Positive for: Hypertension - takes medication [name], [name]. ([id number]) Negative for: Angina; Arrhythmia; Congestive Heart Failure; Coronary Artery Disease; Deep Vein Thrombosis; Hypotension; Myocardial Infarction; Peripheral Arterial Disease; Peripheral Venous Disease; Phlebitis; Vasculitis Gastrointestinal Medical History: Negative for: Cirrhosis ; Colitis; Crohnos; Hepatitis A; Hepatitis B; Hepatitis C Endocrine Medical History: Negative for: Type I Diabetes; Type II Diabetes Genitourinary Medical History: Negative for: End  Stage Renal Disease Immunological Medical History: Negative for: Lupus Erythematosus; Raynaudos; Scleroderma Integumentary (Skin) Medical History: Negative for: History of Burn; History of pressure wounds Musculoskeletal Medical History: Negative for: Gout; Rheumatoid Arthritis; Osteoarthritis; Osteomyelitis Neurologic Medical History: Positive for: Paraplegia - waist down Negative for: Dementia; Neuropathy; Quadriplegia; Seizure Disorder Oncologic Medical History: Negative for: Received Chemotherapy; Received Radiation Past Medical History Notes: Prostate cancer- currently treated with horomone therapy Psychiatric Complaints and Symptoms: No Complaints or Symptoms Medical History: Negative for: Anorexia/bulimia; Confinement Anxiety HBO Extended History Items Eyes: Cataracts Immunizations [name], [name] ([id number]) Pneumococcal Vaccine: Received Pneumococcal Vaccination: No Implantable Devices Family and Social History Cancer: No; Diabetes: No; Heart Disease: No; Hypertension: Yes - Father; Kidney Disease: No; Lung Disease: No; Seizures: No; Stroke: Yes - Mother; Thyroid Problems: No; Tuberculosis: No; Never smoker; Marital Status - Married; Alcohol Use: Never; Drug Use: No History; Caffeine Use: Daily; Advanced Directives: Yes (Copy provided); Patient does not want information on Advanced Directives; Living Will: Yes (Copy provided) Physician Affirmation I have reviewed and agree with the above information. Electronic Signature(s) Signed: 04/13/2018 9:18:02 AM By: Worthy Keeler [name] Signed: 04/13/2018 10:33:34 AM By: Roger Shelter Entered By: Worthy Keeler on 04/12/2018 10:30:56 [name], [name] ([id number]) -------------------------------------------------------------------------------- SuperBill Details Patient Name: [name] Date of Service: 04/12/2018 Medical Record Number: [id number] Patient Account Number: [id number] Date of Birth/Sex:  07-12-1951 (67 y.o. 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

## 2018-04-16 NOTE — Progress Notes (Signed)
[name], ENKE ([id number]) Visit Report for 04/12/2018 Arrival Information Details Patient Name: [name], [name] Date of Service: 04/12/2018 10:00 AM Medical Record Number: [id number] Patient Account Number: [id number] Date of Birth/Sex: 1950/09/25 (67 y.o. M) Treating RN: Roger Shelter Primary Care Kitana Gage: [name] Other Clinician: Referring Anner Baity: [name] Treating Gilma Bessette/Extender: [name], HOYT Weeks in Treatment: 33 Visit Information History Since Last Visit Added or deleted any medications: No Patient Arrived: Wheel Chair Any new allergies or adverse reactions: No Arrival Time: 09:47 Had a fall or experienced change in No Accompanied By: wife activities of daily living that may affect Transfer Assistance: Manual risk of falls: Patient Identification Verified: Yes Signs or symptoms of abuse/neglect since last visito No Secondary Verification Process Completed: Yes Hospitalized since last visit: No Patient Requires Transmission-Based No Implantable device outside of the clinic excluding No Precautions: cellular tissue based products placed in the center Patient Has Alerts: Yes since last visit: Patient Alerts: NOT Has Dressing in Place as Prescribed: Yes Diabetic Pain Present Now: Yes Electronic Signature(s) Signed: 04/12/2018 11:43:05 AM By: Lorine Bears RCP, RRT, CHT Entered By: Lorine Bears on 04/12/2018 09:48:21 [name], [name] ([id number]) -------------------------------------------------------------------------------- Complex / Palliative Patient Assessment Details Patient Name: [name] Date of Service: 04/12/2018 10:00 AM Medical Record Number: [id number] Patient Account Number: [id number] Date of Birth/Sex: 07/11/1951 (67 y.o. M) Treating RN: [name] Primary Care Jesenia Spera: [name] Other Clinician: Referring Holdyn Poyser: [name] Treating Alyxandra Tenbrink/Extender:  [name], HOYT Weeks in Treatment: 19 Palliative Management Criteria Complex Wound Management Criteria Patient has remarkable or complex co-morbidities requiring medications or treatments that extend wound healing times. Examples: o Diabetes mellitus with chronic renal failure or end stage renal disease requiring dialysis o Advanced or poorly controlled rheumatoid arthritis o Diabetes mellitus and end stage chronic obstructive pulmonary disease o Active cancer with current chemo- or radiation therapy Paraplegia Care Approach Wound Care Plan: Complex Wound Management Electronic Signature(s) Signed: 04/12/2018 5:52:52 PM By: Gretta Cool, BSN, RN, CWS, Kim RN, BSN Signed: 04/13/2018 9:18:02 AM By: Worthy Keeler [name] Entered By: Gretta Cool, BSN, RN, CWS, Kim on 04/12/2018 17:52:52 TALBOT, MONARCH ([id number]) -------------------------------------------------------------------------------- Encounter Discharge Information Details Patient Name: [name] Date of Service: 04/12/2018 10:00 AM Medical Record Number: [id number] Patient Account Number: [id number] Date of Birth/Sex: Mar 06, 1951 (67 y.o. M) Treating RN: Roger Shelter Primary Care Damiano Stamper: [name] Other Clinician: Referring Keliah Harned: [name] Treating Marveline Profeta/Extender: [name], HOYT Weeks in Treatment: 30 Encounter Discharge Information Items Discharge Condition: Stable Ambulatory Status: Wheelchair Discharge Destination: Home Transportation: Private Auto Accompanied By: wife Schedule Follow-up Appointment: Yes Clinical Summary of Care: Electronic Signature(s) Signed: 04/13/2018 10:33:34 AM By: Roger Shelter Entered By: Roger Shelter on 04/12/2018 10:19:32 [name] ([id number]) -------------------------------------------------------------------------------- Lower Extremity Assessment Details Patient Name: [name] Date of Service: 04/12/2018 10:00  AM Medical Record Number: [id number] Patient Account Number: [id number] Date of Birth/Sex: June 19, 1951 (67 y.o. M) Treating RN: Secundino Ginger Primary Care Camari Quintanilla: [name] Other Clinician: Referring Elianna Windom: [name] Treating Thayer Inabinet/Extender: [name], HOYT Weeks in Treatment: 19 Electronic Signature(s) Signed: 04/15/2018 8:43:30 AM By: Secundino Ginger Entered By: Secundino Ginger on 04/12/2018 09:53:33 Alsteen, [name] ([id number]) -------------------------------------------------------------------------------- Multi Wound Chart Details Patient Name: [name] Date of Service: 04/12/2018 10:00 AM Medical Record Number: [id number] Patient Account Number: [id number] Date of Birth/Sex: 1950-08-12 (67 y.o. M) Treating RN: Roger Shelter Primary Care Davey Bergsma: [name] Other Clinician: Referring Meziah Blasingame: [name] Treating Jakyri Brunkhorst/Extender: [name], HOYT Weeks in  Treatment: 19 Vital Signs Height(in): 73 Pulse(bpm): 58 Weight(lbs): 210 Blood Pressure(mmHg): 112/80 Body Mass Index(BMI): 28 Temperature(F): 98.7 Respiratory Rate 16 (breaths/min): Photos: [N/A:N/A] Wound Location: Right Gluteal fold N/A N/A Wounding Event: Pressure Injury N/A N/A Primary Etiology: Pressure Ulcer N/A N/A Comorbid History: Cataracts, Hypertension, N/A N/A Paraplegia Date Acquired: 11/02/2017 N/A N/A Weeks of Treatment: 19 N/A N/A Wound Status: Open N/A N/A Measurements L x W x D 4.5x3.2x0.2 N/A N/A (cm) Area (cm) : 11.31 N/A N/A Volume (cm) : 2.262 N/A N/A % Reduction in Area: -12.50% N/A N/A % Reduction in Volume: -125.10% N/A N/A Classification: Category/Stage III N/A N/A Exudate Amount: Medium N/A N/A Exudate Type: Serous N/A N/A Exudate Color: amber N/A N/A Wound Margin: Flat and Intact N/A N/A Granulation Amount: Small (1-33%) N/A N/A Granulation Quality: Red, Pink N/A N/A Necrotic Amount: Large (67-100%) N/A N/A Exposed Structures: Fat Layer  (Subcutaneous N/A N/A Tissue) Exposed: Yes Fascia: No Tendon: No Muscle: No Joint: No Bone: No [name], [name] ([id number]) Epithelialization: None N/A N/A Periwound Skin Texture: Excoriation: No N/A N/A Induration: No Callus: No Crepitus: No Rash: No Scarring: No Periwound Skin Moisture: Maceration: No N/A N/A Dry/Scaly: No Periwound Skin Color: Atrophie Blanche: No N/A N/A Cyanosis: No Ecchymosis: No Erythema: No Hemosiderin Staining: No Mottled: No Pallor: No Rubor: No Temperature: No Abnormality N/A N/A Tenderness on Palpation: No N/A N/A Wound Preparation: Ulcer Cleansing: N/A N/A Rinsed/Irrigated with Saline Topical Anesthetic Applied: Other: lidocaine 4% Treatment Notes Electronic Signature(s) Signed: 04/13/2018 10:33:34 AM By: Roger Shelter Entered By: Roger Shelter on 04/12/2018 10:04:52 [name], [name] ([id number]) -------------------------------------------------------------------------------- [place] Details Patient Name: [name] Date of Service: 04/12/2018 10:00 AM Medical Record Number: [id number] Patient Account Number: [id number] Date of Birth/Sex: January 17, 1951 (67 y.o. M) Treating RN: Roger Shelter Primary Care Vernel Donlan: [name] Other Clinician: Referring Cylah Fannin: [name] Treating Filip Luten/Extender: [name], HOYT Weeks in Treatment: 48 Active Inactive ` Orientation to the Wound Care Program Nursing Diagnoses: Knowledge deficit related to the wound healing center program Goals: Patient/caregiver will verbalize understanding of the [facility] Program Date Initiated: 11/30/2017 Target Resolution Date: 12/21/2017 Goal Status: Active Interventions: Provide education on orientation to the wound center Notes: ` Wound/Skin Impairment Nursing Diagnoses: Impaired tissue integrity Goals: Patient/caregiver will verbalize understanding of skin care regimen Date  Initiated: 11/30/2017 Target Resolution Date: 12/21/2017 Goal Status: Active Ulcer/skin breakdown will have a volume reduction of 30% by week 4 Date Initiated: 11/30/2017 Target Resolution Date: 12/21/2017 Goal Status: Active Interventions: Assess patient/caregiver ability to obtain necessary supplies Assess patient/caregiver ability to perform ulcer/skin care regimen upon admission and as needed Assess ulceration(s) every visit Treatment Activities: Skin care regimen initiated : 11/30/2017 Notes: Electronic Signature(s) Signed: 04/13/2018 10:33:34 AM By: [name], [name] ([id number]) Entered By: Roger Shelter on 04/12/2018 10:04:42 [name], [name] ([id number]) -------------------------------------------------------------------------------- Pain Assessment Details Patient Name: [name] Date of Service: 04/12/2018 10:00 AM Medical Record Number: [id number] Patient Account Number: [id number] Date of Birth/Sex: 04-20-1951 (67 y.o. M) Treating RN: Roger Shelter Primary Care Ajit Errico: [name] Other Clinician: Referring Abbagail Scaff: [name] Treating Natavia Sublette/Extender: [name], HOYT Weeks in Treatment: 19 Active Problems Location of Pain Severity and Description of Pain Patient Has Paino Yes Site Locations Rate the pain. Current Pain Level: 4 Worst Pain Level: 8 Least Pain Level: 0 Pain Management and Medication Current Pain Management: Electronic Signature(s) Signed: 04/12/2018 11:43:05 AM By: Lorine Bears RCP, RRT, CHT Signed: 04/13/2018 10:33:34 AM By: Roger Shelter  Entered By: Lorine Bears on 04/12/2018 09:48:46 [name] ([id number]) -------------------------------------------------------------------------------- Patient/Caregiver Education Details Patient Name: [name] Date of Service: 04/12/2018 10:00 AM Medical Record Number: [id number] Patient Account Number:  [id number] Date of Birth/Gender: 02/03/1951 (67 y.o. M) Treating RN: Roger Shelter Primary Care Physician: [name] Other Clinician: Referring Physician: [name] Treating Physician/Extender: Sharalyn Ink in Treatment: 97 Education Assessment Education Provided To: Patient and Caregiver Education Topics Provided Wound Debridement: Handouts: Wound Debridement Methods: Explain/Verbal Responses: State content correctly Wound/Skin Impairment: Handouts: Caring for Your Ulcer Methods: Explain/Verbal Responses: State content correctly Electronic Signature(s) Signed: 04/13/2018 10:33:34 AM By: Roger Shelter Entered By: Roger Shelter on 04/12/2018 10:19:50 [name] ([id number]) -------------------------------------------------------------------------------- Wound Assessment Details Patient Name: [name] Date of Service: 04/12/2018 10:00 AM Medical Record Number: [id number] Patient Account Number: [id number] Date of Birth/Sex: [birth year] (67 y.o. M) Treating RN: Secundino Ginger Primary Care Nickholas Goldston: [name] Other Clinician: Referring Jessi Pitstick: [name] Treating Sherryann Frese/Extender: [name], HOYT Weeks in Treatment: 19 Wound Status Wound Number: 1 Primary Etiology: Pressure Ulcer Wound Location: Right Gluteal fold Wound Status: Open Wounding Event: Pressure Injury Comorbid History: Cataracts, Hypertension, Paraplegia Date Acquired: 11/02/2017 Weeks Of Treatment: 19 Clustered Wound: No Photos Photo Uploaded By: Secundino Ginger on 04/12/2018 10:02:30 Wound Measurements Length: (cm) 4.5 Width: (cm) 3.2 Depth: (cm) 0.2 Area: (cm) 11.31 Volume: (cm) 2.262 % Reduction in Area: -12.5% % Reduction in Volume: -125.1% Epithelialization: None Tunneling: No Undermining: No Wound Description Classification: Category/Stage III Foul Odor A Wound Margin: Flat and Intact Slough/Fibr Exudate Amount: Medium Exudate Type:  Serous Exudate Color: amber fter Cleansing: No ino Yes Wound Bed Granulation Amount: Small (1-33%) Exposed Structure Granulation Quality: Red, Pink Fascia Exposed: No Necrotic Amount: Large (67-100%) Fat Layer (Subcutaneous Tissue) Exposed: Yes Necrotic Quality: Adherent Slough Tendon Exposed: No Muscle Exposed: No Joint Exposed: No Bone Exposed: No Periwound Skin Texture [name], [name] ([id number]) Texture Color No Abnormalities Noted: No No Abnormalities Noted: No Callus: No Atrophie Blanche: No Crepitus: No Cyanosis: No Excoriation: No Ecchymosis: No Induration: No Erythema: No Rash: No Hemosiderin Staining: No Scarring: No Mottled: No Pallor: No Moisture Rubor: No No Abnormalities Noted: No Dry / Scaly: No Temperature / Pain Maceration: No Temperature: No Abnormality Wound Preparation Ulcer Cleansing: Rinsed/Irrigated with Saline Topical Anesthetic Applied: Other: lidocaine 4%, Treatment Notes Wound #1 (Right Gluteal fold) 1. Cleansed with: Clean wound with Normal Saline 2. Anesthetic Topical Lidocaine 4% cream to wound bed prior to debridement 4. Dressing Applied: Other dressing (specify in notes) Notes endoform, mepitel, steri strips and BFD Electronic Signature(s) Signed: 04/15/2018 8:43:30 AM By: Secundino Ginger Entered By: Secundino Ginger on 04/12/2018 10:00:12 [name], [name] ([id number]) -------------------------------------------------------------------------------- Vitals Details Patient Name: [name] Date of Service: 04/12/2018 10:00 AM Medical Record Number: [id number] Patient Account Number: [id number] Date of Birth/Sex: 1951/03/09 (67 y.o. M) Treating RN: Roger Shelter Primary Care Marquesha Robideau: [name] Other Clinician: Referring Kacey Dysert: [name] Treating Vylette Strubel/Extender: [name], HOYT Weeks in Treatment: 19 Vital Signs Time Taken: 09:45 Temperature (F): 98.7 Height (in): 73 Pulse (bpm): 58 Weight  (lbs): 210 Respiratory Rate (breaths/min): 16 Body Mass Index (BMI): 27.7 Blood Pressure (mmHg): 112/80 Reference Range: 80 - 120 mg / dl Electronic Signature(s) Signed: 04/12/2018 11:43:05 AM By: Lorine Bears RCP, RRT, CHT Entered By: Lorine Bears on 04/12/2018 09:50:38

## 2018-04-19 ENCOUNTER — Encounter: Payer: Medicare Other | Admitting: Physician Assistant

## 2018-04-19 DIAGNOSIS — L89313 Pressure ulcer of right buttock, stage 3: Secondary | ICD-10-CM | POA: Diagnosis not present

## 2018-04-21 NOTE — Progress Notes (Signed)
[name], [name] ([id number]) Visit Report for 04/19/2018 Chief Complaint Document Details Patient Name: [name] Date of Service: 04/19/2018 2:45 PM Medical Record Number: [id number] Patient Account Number: [id number] Date of Birth/Sex: 05-09-1951 (67 y.o. M) Treating RN: Roger Shelter Primary Care Provider: [name] Other Clinician: Referring Provider: [name] Treating Provider/Extender: [name], HOYT Weeks in Treatment: 20 Information Obtained from: Patient Chief Complaint Right gluteal fold ulcer Electronic Signature(s) Signed: 04/19/2018 11:58:42 PM By: Worthy Keeler [name] Entered By: Worthy Keeler on 04/19/2018 15:34:01 [name], [name] ([id number]) -------------------------------------------------------------------------------- Debridement Details Patient Name: [name] Date of Service: 04/19/2018 2:45 PM Medical Record Number: [id number] Patient Account Number: [id number] Date of Birth/Sex: [birth year] (67 y.o. M) Treating RN: Roger Shelter Primary Care Provider: [name] Other Clinician: Referring Provider: [name] Treating Provider/Extender: [name], HOYT Weeks in Treatment: 20 Debridement Performed for Wound #1 Right Gluteal fold Assessment: Performed By: Physician [name], HOYT E., [name] Debridement Type: Debridement Level of Consciousness (Pre- Awake and Alert procedure): Pre-procedure Verification/Time Yes - 15:38 Out Taken: Start Time: 15:38 Pain Control: Other : lidocaine 4% Total Area Debrided (L x W): 4.4 (cm) x 3.1 (cm) = 13.64 (cm) Tissue and other material Viable, Non-Viable, Slough, Subcutaneous, Biofilm, Slough debrided: Level: Skin/Subcutaneous Tissue Debridement Description: Excisional Instrument: Curette Bleeding: Minimum Hemostasis Achieved: Pressure End Time: 15:39 Procedural Pain: 0 Post Procedural Pain: 0 Response to Treatment: Procedure was tolerated  well Level of Consciousness Awake and Alert (Post-procedure): Post Debridement Measurements of Total Wound Length: (cm) 4.4 Stage: Category/Stage III Width: (cm) 3.1 Depth: (cm) 0.3 Volume: (cm) 3.214 Character of Wound/Ulcer Post Stable Debridement: Post Procedure Diagnosis Same as Pre-procedure Electronic Signature(s) Signed: 04/19/2018 4:45:54 PM By: Roger Shelter Signed: 04/19/2018 11:58:42 PM By: Worthy Keeler [name] Entered By: Roger Shelter on 04/19/2018 15:39:52 [name], [name] ([id number]) -------------------------------------------------------------------------------- HPI Details Patient Name: [name] Date of Service: 04/19/2018 2:45 PM Medical Record Number: [id number] Patient Account Number: [id number] Date of Birth/Sex: 1951/03/08 (67 y.o. 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 He states that there is no history of diabetes that he is aware of. He does have issues with his prostate and is currently receiving treatment for this by way of oral medication. With that being said I do not have a lot of details in that regard. Nonetheless the patient presents today as a result of having been referred to [place] by another provider initially home health was set to come out and take care of his wound although due to the fact that he apparently drives he's not able to receive home health. His wife is therefore trying to help take care of this wound within although they have been struggling with what exactly to  do at this point. She states that she can do some things but she is definitely not a nurse and does have some issues with looking at blood. The good news is the wound does not appear to be too deep and is fairly superficial at this point. There is no slough noted there is some nonviable skin noted around the  surface of the wound and the perimeter at this point. The central portion of the wound appears to be very good with a dermal layer noted this does not appear to be again deep enough to extend it to subcutaneous tissue at this point. 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 In the pump he has Dilaudid and baclofen. 12/07/17 on evaluation today patient presents for follow-up concerning his ongoing lower back thigh ulcer on the right. He states that he did not get the supplies ordered and therefore has not really been able to perform the dressing changes as directed exactly. His wife was able to get some Boarder Foam Dressing's from the drugstore and subsequently has been using hydrogel which did help to a degree in the wound does appear to be able smaller. There is actually more drainage this week noted than previous. 12/21/17 on evaluation today patient appears to be doing rather well in regard to his right gluteal ulcer. He has been tolerating the dressing changes without complication. There does not appear to be any evidence of infection at this point in time. Overall the wound does seem to be making some progress as far as the edges are concerned there's not as much in the way of overlapping of the external wound edges and he has a good epithelium to wound bed border for the most part. This however is not true right at the 12 o'clock location over the span of a little over a centimeters which actually will require debridement today to clean this away and hopefully allow it to continue to  heal more appropriately. 12/28/17 on evaluation today patient appears to be doing rather well in regard to his ulcer in the left gluteal region. He's been tolerating the dressing changes without complication. Apparently he has had some difficulty getting his dressing material. Apparently there's been some confusion with ordering we're gonna check into this. Nonetheless overall he's been showing signs of improvement which is good news. Debridement is not required today. 01/04/18 on evaluation today patient presents for follow-up concerning his right gluteal ulcer. He has been tolerating the dressing changes fairly well. On inspection today it appears he may actually have some maceration them concerned about the fact that he may be developing too much moisture in and around the wound bed which can cause delay in healing. With that being said he unfortunately really has not showed significant signs of improvement since last week's evaluation in fact this may even be just the little bit/slightly larger. Nonetheless he's been having a lot of discomfort I'm not sure this is even related to the wound as he has no pain when I'm to breeding or otherwise cleaning the wound during evaluation today. Nonetheless this is something that we did recommend he talked to his pain specialist concerning. 01/11/18 on evaluation today patient appears to be doing better in regard to his ulceration. He has been tolerating the dressing changes without complication. With that being said overall there's no evidence of infection which is good news. The only thing is he did receive the hatch affair blue classic versus the ready nonetheless I feel like this is perfectly fine and appears to have done well for him over the past week. 01/25/18 on evaluation today patient's wound actually appears to be a little bit larger than during the last evaluation. The good KINGSLY, KLOEPFER. ([id number]) news is the majority  of the wound edges  actually appear to be fairly firmly attached to the wound bed unfortunately again we're not really making progress in regard to the size. Roughly the wound is about the same size as when I first saw him although again the wound margin/edges appear to be much better. 02/01/18 on evaluation today patient actually appears to be doing very well in regard to his wound. Applying the Prisma dry does seem to be better although he does still have issues with slow progression of the wound. There was a slight improvement compared to last week's measurements today. Nonetheless I have been considering other options as far as the possibility of Theraskin or even a snap vac. In general I'm not sure that the Theraskin due to location of the wound would be a very good idea. Nonetheless I do think that a snap vac could be a possibility for the patient and in fact I think this could even be an excellent way to manage the wound possibly seeing some improvement in a very rapid fashion here. Nonetheless this is something that we would need to get approved and I did have a lengthy conversation with the patient about this today. 02/08/18 on evaluation today patient appears to be doing a little better in regard to his ulcer. He has been tolerating the dressing changes without complication. Fortunately despite the fact that the wound is a little bit smaller it's not significantly so unfortunately. We have discussed the possibility of a snap vac we did check with insurance this is actually covered at this point. Fortunately there does not appear to be any sign of infection. Overall I'm fairly pleased with how things seem to be appearing at this point. 02/15/18 on evaluation today patient appears to be doing rather well in regard to his right gluteal ulcer. Unfortunately the snap vac did not stay in place with his sheer and friction this came loose and did not seem to maintain seal very well. He worked for about two days and it  did seem to do very well during that time according to his wife but in general this does not seem to be something that's gonna be beneficial for him long-term. I do believe we need to go back to standard dressings to see if we can find something that will be of benefit. 03/02/18- He is here in follow up evaluation; there is minimal change in the wound. He will continue with the same treatment plan, would consider changing to iodosrob/iodoflex if ulcer continues to to plateau. He will follow up next week 03/08/18 on evaluation today patient's wound actually appears to be about the same size as when I previously saw him several weeks back. Unfortunately he does have some slightly dark discoloration in the central portion of the wound which has me concerned about pressure injury. I do believe he may be sitting for too long a period of time in fact he tells me that "I probably sit for much too long". He does have some Slough noted on the surface of the wound and again as far as the size of the wound is concerned I'm really not seeing anything that seems to have improved significantly. 03/15/18 on evaluation today patient appears to be doing fairly well in regard to his ulcer. The wound measured pretty much about the same today compared to last week's evaluation when looking at his graph. With that being said the area of bruising/deep tissue injury that was noted last week I do not  see at this point. He did get a new cushion fortunately this does seem to be have been of benefit in my pinion. It does appear that he's been off of this more which is good news as well I think that is definitely showing in the overall wound measurements. With that being said I do believe that he needs to continue to offload I don't think that the fact this is doing better should be or is going to allow him to not have to offload and explain this to him as well. Overall he seems to be in agreement the plan I think he understands.  The overall appearance of the wound bed is improved compared to last week I think the Iodoflex has been beneficial in that regard. 03/29/18 on evaluation today patient actually appears to be doing rather well in regard to his wound from the overall appearance standpoint he does have some granulation although there's some Slough on the surface of the wound noted as well. With that being said he unfortunately has not improved in regard to the overall measurement of the wound in volume or in size. I did have a discussion with him very specifically about offloading today. He actually does work although he mainly is just sitting throughout the day. He tells me he offloads by "lifting himself up for 30 seconds off of his chair occasionally" purchase from [employer] which does seem to have helped. And he has a new cushion that he with that being said he's also able to stand some for a very short period of time but not significant enough I think to provide appropriate offloading. I think the biggest issue at this point with the wound and the fact is not healing as quickly as we would like is due to the fact that he is really not able to appropriately offload while at work. He states the beginning after his injury he actually had a bed at his job that he could lay on in order to offload and that does seem to have been of help back at that time. Nonetheless he had not done this in quite some time unfortunately. I think that could be helpful for him this is something I would like for him to look into. 04/05/18 on evaluation today patient actually presents for follow-up concerning his right gluteal ulcer. Again he really is not significantly improved even compared to last week. He has been tolerating the dressing changes without complication. With that being said fortunately there appears to be no evidence of infection at this time. He has been more proactive in trying to offload. [name], [name]  ([id number]) 04/12/18 on evaluation today patient actually appears to be doing a little better in regard to his wound and the right gluteal fold region. He's been tolerating the dressing changes since removing the oasis without complication. However he was having a lot of burning initially with the oasis in place. He's unsure of exactly why this was given so much discomfort but he assumes that it was the oasis itself causing the problem. Nonetheless this had to be removed after about three days in place although even those three days seem to have made a fairly good improvement in regard to the overall appearance of the wound bed. In fact is the first time that he's made any improvement from the standpoint of measurements in about six weeks. He continues to have no discomfort over the area of the wound itself which leads me to wonder why  he was having the burning with the oasis when he does not even feel the actual debridement's themselves. I am somewhat perplexed by this. 04/19/18 on evaluation today patient's wound actually appears to be showing signs of epithelialization around the edge of the wound and in general actually appears to be doing better which is good news. He did have the same burning after about three days with applying the Endoform last week in the same fashion that I would generally apply a skin substitute. This seems to indicate that it's not the oasis to cause the problem but potentially the moisture buildup that just causes things to burn or there may be some other reaction with the skin prep or Steri-Strips. Nonetheless I'm not sure that is gonna be able to tolerate any skin substitute for a long period of time. The good news is the wound actually appears to be doing better today compared to last week and does seem to finally be making some progress. Electronic Signature(s) Signed: 04/20/2018 5:24:57 PM By: Worthy Keeler [name] Entered By: Worthy Keeler on 04/20/2018  11:19:29 [name], [name] ([id number]) -------------------------------------------------------------------------------- Physical Exam Details Patient Name: [name] Date of Service: 04/19/2018 2:45 PM Medical Record Number: [id number] Patient Account Number: [id number] Date of Birth/Sex: 04/21/1951 (67 y.o. M) Treating RN: Roger Shelter Primary Care Provider: [name] Other Clinician: Referring Provider: [name] Treating Provider/Extender: [name], HOYT Weeks in Treatment: 54 Constitutional Well-nourished and well-hydrated in no acute distress. Respiratory normal breathing without difficulty. clear to auscultation bilaterally. Cardiovascular regular rate and rhythm with normal S1, S2. Psychiatric this patient is able to make decisions and demonstrates good insight into disease process. Alert and Oriented x 3. pleasant and cooperative. Notes On inspection today the patient did have some Slough noted on the surface of the wound which was sharply debrided away today he tolerated this without complication post debridement the wound bed appears to be doing significantly better. Electronic Signature(s) Signed: 04/20/2018 5:24:57 PM By: Worthy Keeler [name] Entered By: Worthy Keeler on 04/20/2018 11:20:01 REFUGIO, VANDEVOORDE ([id number]) -------------------------------------------------------------------------------- Physician Orders Details Patient Name: [name] Date of Service: 04/19/2018 2:45 PM Medical Record Number: [id number] Patient Account Number: [id number] Date of Birth/Sex: 07-08-1951 (67 y.o. 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 Skin Barriers/Peri-Wound Care Wound #1 Right Gluteal fold o Skin Prep Primary Wound Dressing Wound #1 Right Gluteal fold o Other: - endoform Secondary Dressing Wound #1 Right Gluteal fold o Boardered Foam Dressing Dressing Change Frequency Wound #1 Right Gluteal fold o Change dressing every week - outer dressing may be changed if it gets wet. If dressing comes off may replace with endoform Follow-up Appointments Wound #1 Right Gluteal fold o Return Appointment in 1 week. Off-Loading Wound #1 Right Gluteal fold o Other: - Keep pressure off of area [name], [name] ([id number]) Electronic Signature(s) Signed: 04/19/2018 4:45:54 PM By: Roger Shelter Signed: 04/19/2018 11:58:42 PM By: [name]  III, Hoyt [name] Entered By: Roger Shelter on 04/19/2018 15:43:25 [name], [name] ([id number]) -------------------------------------------------------------------------------- Problem List Details Patient Name: BUELL, PARCEL Date of Service: 04/19/2018 2:45 PM Medical Record Number: [id number] Patient Account Number: [id number] Date of Birth/Sex: 01-20-1951 (67 y.o. 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, stage 3 11/30/2017 No Yes G82.20 Paraplegia, unspecified 11/30/2017 No Yes S34.109S Unspecified injury  to unspecified level of lumbar spinal cord, 11/30/2017 No Yes sequela I10 Essential (primary) hypertension 11/30/2017 No Yes Inactive Problems Resolved Problems Electronic Signature(s) Signed: 04/19/2018 11:58:42 PM By: Worthy Keeler [name] Entered By: Worthy Keeler on 04/19/2018 15:33:54 [name] ([id number]) -------------------------------------------------------------------------------- Progress Note Details Patient Name: [name] Date of Service: 04/19/2018 2:45 PM Medical Record Number: [id number] Patient Account Number: [id number] Date of Birth/Sex: [birth year] (67 y.o. 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 He states that there is no history of diabetes that he is aware of. He does have issues with his prostate and is currently receiving treatment for this by way of oral medication. With that being said I do not have a lot of details in that regard. Nonetheless the patient presents today as a result of having been referred to [place] by another provider initially home health was set to come out and take care of his wound although due to the fact that he apparently drives he's not able to receive home health. His wife is therefore trying to help take care of this wound within although they have been struggling with what exactly to do at this point. She states that she can do some things but she is definitely not a nurse and does have some  issues with looking at blood. The good news is the wound does not appear to be too deep and is fairly superficial at this point. There is no slough noted there is some nonviable skin noted around the surface of the wound and the perimeter at this point. The central portion of the wound appears to be very good with a dermal layer noted this does not appear to be again deep enough to extend it to subcutaneous tissue at this point. 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 In the pump he has Dilaudid and baclofen. 12/07/17 on evaluation today patient presents for follow-up concerning his ongoing lower back thigh ulcer on the right. He states that he did not get the supplies ordered and therefore has not really been able to perform the dressing changes as directed exactly. His wife was able to get some Boarder Foam Dressing's from the drugstore and subsequently has been using hydrogel which did help to a degree in the wound does appear to be able smaller. There is actually more drainage this  week noted than previous. 12/21/17 on evaluation today patient appears to be doing rather well in regard to his right gluteal ulcer. He has been tolerating the dressing changes without complication. There does not appear to be any evidence of infection at this point in time. Overall the wound does seem to be making some progress as far as the edges are concerned there's not as much in the way of overlapping of the external wound edges and he has a good epithelium to wound bed border for the most part. This however is not true right at the 12 o'clock location over the span of a little over a centimeters which actually will require debridement today to clean this away and hopefully allow it to continue to heal more appropriately. 12/28/17 on evaluation today patient appears to be doing rather well in regard to his  ulcer in the left gluteal region. He's been tolerating the dressing changes without complication. Apparently he has had some difficulty getting his dressing material. Apparently there's been some confusion with ordering we're gonna check into this. Nonetheless overall he's been showing signs of improvement which is good news. Debridement is not required today. 01/04/18 on evaluation today patient presents for follow-up concerning his right gluteal ulcer. He has been tolerating the dressing changes fairly well. On inspection today it appears he may actually have some maceration them concerned about the fact that he may be developing too much moisture in and around the wound bed which can cause delay in healing. With that being said he unfortunately really has not showed significant signs of improvement since last week's evaluation in fact this may even be just the little bit/slightly larger. Nonetheless he's been having a lot of discomfort I'm not sure this is even related to the wound as he has no pain when I'm to breeding or otherwise cleaning the wound during evaluation today. Nonetheless this is something that we did recommend he talked to his pain specialist concerning. 01/11/18 on evaluation today patient appears to be doing better in regard to his ulceration. He has been tolerating the dressing Santee, Myquan J. ([id number]) changes without complication. With that being said overall there's no evidence of infection which is good news. The only thing is he did receive the hatch affair blue classic versus the ready nonetheless I feel like this is perfectly fine and appears to have done well for him over the past week. 01/25/18 on evaluation today patient's wound actually appears to be a little bit larger than during the last evaluation. The good news is the majority of the wound edges actually appear to be fairly firmly attached to the wound bed unfortunately again we're not really making  progress in regard to the size. Roughly the wound is about the same size as when I first saw him although again the wound margin/edges appear to be much better. 02/01/18 on evaluation today patient actually appears to be doing very well in regard to his wound. Applying the Prisma dry does seem to be better although he does still have issues with slow progression of the wound. There was a slight improvement compared to last week's measurements today. Nonetheless I have been considering other options as far as the possibility of Theraskin or even a snap vac. In general I'm not sure that the Theraskin due to location of the wound would be a very good idea. Nonetheless I do think that a snap vac could be a possibility for the patient and in fact I think this  could even be an excellent way to manage the wound possibly seeing some improvement in a very rapid fashion here. Nonetheless this is something that we would need to get approved and I did have a lengthy conversation with the patient about this today. 02/08/18 on evaluation today patient appears to be doing a little better in regard to his ulcer. He has been tolerating the dressing changes without complication. Fortunately despite the fact that the wound is a little bit smaller it's not significantly so unfortunately. We have discussed the possibility of a snap vac we did check with insurance this is actually covered at this point. Fortunately there does not appear to be any sign of infection. Overall I'm fairly pleased with how things seem to be appearing at this point. 02/15/18 on evaluation today patient appears to be doing rather well in regard to his right gluteal ulcer. Unfortunately the snap vac did not stay in place with his sheer and friction this came loose and did not seem to maintain seal very well. He worked for about two days and it did seem to do very well during that time according to his wife but in general this does not seem to  be something that's gonna be beneficial for him long-term. I do believe we need to go back to standard dressings to see if we can find something that will be of benefit. 03/02/18- He is here in follow up evaluation; there is minimal change in the wound. He will continue with the same treatment plan, would consider changing to iodosrob/iodoflex if ulcer continues to to plateau. He will follow up next week 03/08/18 on evaluation today patient's wound actually appears to be about the same size as when I previously saw him several weeks back. Unfortunately he does have some slightly dark discoloration in the central portion of the wound which has me concerned about pressure injury. I do believe he may be sitting for too long a period of time in fact he tells me that "I probably sit for much too long". He does have some Slough noted on the surface of the wound and again as far as the size of the wound is concerned I'm really not seeing anything that seems to have improved significantly. 03/15/18 on evaluation today patient appears to be doing fairly well in regard to his ulcer. The wound measured pretty much about the same today compared to last week's evaluation when looking at his graph. With that being said the area of bruising/deep tissue injury that was noted last week I do not see at this point. He did get a new cushion fortunately this does seem to be have been of benefit in my pinion. It does appear that he's been off of this more which is good news as well I think that is definitely showing in the overall wound measurements. With that being said I do believe that he needs to continue to offload I don't think that the fact this is doing better should be or is going to allow him to not have to offload and explain this to him as well. Overall he seems to be in agreement the plan I think he understands. The overall appearance of the wound bed is improved compared to last week I think the Iodoflex has  been beneficial in that regard. 03/29/18 on evaluation today patient actually appears to be doing rather well in regard to his wound from the overall appearance standpoint he does have some granulation although there's some  Slough on the surface of the wound noted as well. With that being said he unfortunately has not improved in regard to the overall measurement of the wound in volume or in size. I did have a discussion with him very specifically about offloading today. He actually does work although he mainly is just sitting throughout the day. He tells me he offloads by "lifting himself up for 30 seconds off of his chair occasionally" purchase from [employer] which does seem to have helped. And he has a new cushion that he with that being said he's also able to stand some for a very short period of time but not significant enough I think to provide appropriate offloading. I think the biggest issue at this point with the wound and the fact is not healing as quickly as we would like is due to the fact that he is really not able to appropriately offload while at work. He states the beginning after his injury he actually had a bed at his job that he could lay on in order to offload and that does seem to have been of help back at that time. Nonetheless he had not done this in quite some time unfortunately. I think that could be helpful for him this is something I would like for him to look into. [name], [name] ([id number]) 04/05/18 on evaluation today patient actually presents for follow-up concerning his right gluteal ulcer. Again he really is not significantly improved even compared to last week. He has been tolerating the dressing changes without complication. With that being said fortunately there appears to be no evidence of infection at this time. He has been more proactive in trying to offload. 04/12/18 on evaluation today patient actually appears to be doing a little better in regard  to his wound and the right gluteal fold region. He's been tolerating the dressing changes since removing the oasis without complication. However he was having a lot of burning initially with the oasis in place. He's unsure of exactly why this was given so much discomfort but he assumes that it was the oasis itself causing the problem. Nonetheless this had to be removed after about three days in place although even those three days seem to have made a fairly good improvement in regard to the overall appearance of the wound bed. In fact is the first time that he's made any improvement from the standpoint of measurements in about six weeks. He continues to have no discomfort over the area of the wound itself which leads me to wonder why he was having the burning with the oasis when he does not even feel the actual debridement's themselves. I am somewhat perplexed by this. 04/19/18 on evaluation today patient's wound actually appears to be showing signs of epithelialization around the edge of the wound and in general actually appears to be doing better which is good news. He did have the same burning after about three days with applying the Endoform last week in the same fashion that I would generally apply a skin substitute. This seems to indicate that it's not the oasis to cause the problem but potentially the moisture buildup that just causes things to burn or there may be some other reaction with the skin prep or Steri-Strips. Nonetheless I'm not sure that is gonna be able to tolerate any skin substitute for a long period of time. The good news is the wound actually appears to be doing better today compared to last week and does  seem to finally be making some progress. Patient History Information obtained from Patient. Family History Hypertension - Father, Stroke - Mother, No family history of Cancer, Diabetes, Heart Disease, Kidney Disease, Lung Disease, Seizures, Thyroid  Problems, Tuberculosis. Social History Never smoker, Marital Status - Married, Alcohol Use - Never, Drug Use - No History, Caffeine Use - Daily. Medical And Surgical History Notes Oncologic Prostate cancer- currently treated with horomone therapy Review of Systems (ROS) Constitutional Symptoms (General Health) Denies complaints or symptoms of Fever, Chills. Respiratory The patient has no complaints or symptoms. Cardiovascular The patient has no complaints or symptoms. Psychiatric The patient has no complaints or symptoms. Objective Constitutional [name], [name]. ([id number]) Well-nourished and well-hydrated in no acute distress. Vitals Time Taken: 2:35 PM, Height: 73 in, Weight: 210 lbs, BMI: 27.7, Temperature: 99.4 F, Pulse: 90 bpm, Respiratory Rate: 16 breaths/min, Blood Pressure: 126/72 mmHg. Respiratory normal breathing without difficulty. clear to auscultation bilaterally. Cardiovascular regular rate and rhythm with normal S1, S2. Psychiatric this patient is able to make decisions and demonstrates good insight into disease process. Alert and Oriented x 3. pleasant and cooperative. General Notes: On inspection today the patient did have some Slough noted on the surface of the wound which was sharply debrided away today he tolerated this without complication post debridement the wound bed appears to be doing significantly better. Integumentary (Hair, Skin) Wound #1 status is Open. Original cause of wound was Pressure Injury. The wound is located on the Right Gluteal fold. The wound measures 4.4cm length x 3.1cm width x 0.2cm depth; 10.713cm^2 area and 2.143cm^3 volume. There is Fat Layer (Subcutaneous Tissue) Exposed exposed. There is no tunneling or undermining noted. There is a medium amount of serous drainage noted. The wound margin is flat and intact. There is small (1-33%) red, pink granulation within the wound bed. There is a large (67-100%) amount of necrotic  tissue within the wound bed including Adherent Slough. The periwound skin appearance exhibited: Maceration. The periwound skin appearance did not exhibit: Callus, Crepitus, Excoriation, Induration, Rash, Scarring, Dry/Scaly, Atrophie Blanche, Cyanosis, Ecchymosis, Hemosiderin Staining, Mottled, Pallor, Rubor, Erythema. Periwound temperature was noted as No Abnormality. 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 There was a Excisional Skin/Subcutaneous Tissue Debridement with a total area of 13.64 sq cm performed by [name], HOYT E., [name]. With the following instrument(s): Curette to remove Viable and Non-Viable tissue/material. Material removed includes Subcutaneous Tissue, Slough, and Biofilm after achieving pain control using Other (lidocaine 4%). No specimens were taken. A time out was conducted at 15:38, prior to the start of the procedure. A Minimum amount of bleeding was controlled with Pressure. The procedure was tolerated well with a pain level of 0 throughout and a pain level of 0 following the procedure. Post Debridement [name], [name] ([id number]) Measurements: 4.4cm length x 3.1cm width x 0.3cm depth; 3.214cm^3 volume. Post debridement Stage noted as Category/Stage III. Character of Wound/Ulcer Post Debridement is stable. Post procedure Diagnosis Wound #1: Same as Pre-Procedure Plan Wound Cleansing: Wound #1 Right Gluteal fold: Cleanse wound with mild soap and water Anesthetic (add to Medication List): Wound #1 Right Gluteal fold: Topical Lidocaine 4% cream applied to wound bed prior to debridement (In Clinic Only). Skin Barriers/Peri-Wound Care: Wound #1 Right Gluteal fold: Skin Prep Primary  Wound Dressing: Wound #1 Right Gluteal fold: Other: - endoform Secondary Dressing: Wound #1 Right Gluteal fold: Boardered Foam Dressing Dressing Change Frequency: Wound #1 Right Gluteal fold: Change dressing every week - outer dressing may be changed if it gets wet. If dressing comes off may replace with endoform Follow-up Appointments: Wound #1 Right Gluteal fold: Return Appointment in 1 week. Off-Loading: Wound #1 Right Gluteal fold: Other: - Keep pressure off of area I'm gonna suggest currently that we continue with the above wound care measures for the next week. He is in agreement with plan. We will subsequently see were things stand at follow-up. Please see above for specific wound care orders. We will see patient for re-evaluation in 1 week(s) here in the clinic. If anything worsens or changes patient will contact our office for additional recommendations. Electronic Signature(s) Signed: 04/20/2018 5:24:57 PM By: Worthy Keeler [name] Entered By: Worthy Keeler on 04/20/2018 11:20:12 [name], FEBUS ([id number]) -------------------------------------------------------------------------------- ROS/PFSH Details Patient Name: [name] Date of Service: 04/19/2018 2:45 PM Medical Record Number: [id number] Patient Account Number: [id number] Date of Birth/Sex: 1951/04/24 (67 y.o. M) Treating RN: Roger Shelter Primary Care Provider: [name] Other Clinician: Referring Provider: [name] Treating Provider/Extender: [name], HOYT Weeks in Treatment: 20 Information Obtained From Patient Wound History Do you currently have one or more open woundso Yes How many open wounds do you currently haveo 1 Approximately how long have you had your woundso 1 month How have you been treating your wound(s) until nowo cream Has your wound(s) ever healed and then re-openedo No Have you had any lab work done in the past montho No Have you tested positive for an  antibiotic resistant organism (MRSA, VRE)o No Have you tested positive for osteomyelitis (bone infection)o No Have you had any tests for circulation on your legso No Constitutional Symptoms (General Health) Complaints and Symptoms: Negative for: Fever; Chills Eyes Medical History: Positive for: Cataracts - both removed Negative for: Glaucoma; Optic Neuritis Ear/Nose/Mouth/Throat Medical History: Negative for: Chronic sinus problems/congestion; Middle ear problems Hematologic/Lymphatic Medical History: Negative for: Anemia; Hemophilia; Human Immunodeficiency Virus; Lymphedema Respiratory Complaints and Symptoms: No Complaints or Symptoms Medical History: Negative for: Aspiration; Asthma; Chronic Obstructive Pulmonary Disease (COPD); Pneumothorax; Sleep Apnea; Tuberculosis Cardiovascular Complaints and Symptoms: No Complaints or Symptoms Medical History: Positive for: Hypertension - takes medication ARIANNA, DELSANTO. ([id number]) Negative for: Angina; Arrhythmia; Congestive Heart Failure; Coronary Artery Disease; Deep Vein Thrombosis; Hypotension; Myocardial Infarction; Peripheral Arterial Disease; Peripheral Venous Disease; Phlebitis; Vasculitis Gastrointestinal Medical History: Negative for: Cirrhosis ; Colitis; Crohnos; Hepatitis A; Hepatitis B; Hepatitis C Endocrine Medical History: Negative for: Type I Diabetes; Type II Diabetes Genitourinary Medical History: Negative for: End Stage Renal Disease Immunological Medical History: Negative for: Lupus Erythematosus; Raynaudos; Scleroderma Integumentary (Skin) Medical History: Negative for: History of Burn; History of pressure wounds Musculoskeletal Medical History: Negative for: Gout; Rheumatoid Arthritis; Osteoarthritis; Osteomyelitis Neurologic Medical History: Positive for: Paraplegia - waist down Negative for: Dementia; Neuropathy; Quadriplegia; Seizure Disorder Oncologic Medical History: Negative for:  Received Chemotherapy; Received Radiation Past Medical History Notes: Prostate cancer- currently treated with horomone therapy Psychiatric Complaints and Symptoms: No Complaints or Symptoms Medical History: Negative for: Anorexia/bulimia; Confinement Anxiety HBO Extended History Items Eyes: Cataracts Immunizations MALE, MINISH ([id number]) Pneumococcal Vaccine: Received Pneumococcal Vaccination: No Implantable Devices Family and Social History Cancer: No; Diabetes: No; Heart Disease: No; Hypertension: Yes - Father; Kidney Disease: No; Lung Disease: No; Seizures: No; Stroke: Yes - Mother; Thyroid Problems: No; Tuberculosis: No;  Never smoker; Marital Status - Married; Alcohol Use: Never; Drug Use: No History; Caffeine Use: Daily; Advanced Directives: Yes (Copy provided); Patient does not want information on Advanced Directives; Living Will: Yes (Copy provided) Physician Affirmation I have reviewed and agree with the above information. Electronic Signature(s) Signed: 04/20/2018 4:16:12 PM By: Roger Shelter Signed: 04/20/2018 5:24:57 PM By: Worthy Keeler [name] Entered By: Worthy Keeler on 04/20/2018 11:19:46 ADISON, JERGER ([id number]) -------------------------------------------------------------------------------- SuperBill Details Patient Name: [name] Date of Service: 04/19/2018 Medical Record Number: [id number] Patient Account Number: [id number] Date of Birth/Sex: [birth year] (67 y.o. 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

## 2018-04-21 NOTE — Progress Notes (Signed)
[name] ([id number]) Visit Report for 04/19/2018 Arrival Information Details Patient Name: [name], [name] Date of Service: 04/19/2018 2:45 PM Medical Record Number: [id number] Patient Account Number: [id number] Date of Birth/Sex: 01/30/1951 (67 y.o. M) Treating RN: [name] Primary Care [name]: [name] Other Clinician: Referring [name]: [name] [name], [name]: 20 Visit Information History Since Last Visit Added or deleted any medications: No Patient Arrived: Wheel Chair Any new allergies or adverse reactions: No Arrival Time: 14:37 Had a fall or experienced change in No Accompanied By: wife activities of daily living that may affect Transfer Assistance: Manual risk of falls: Patient Identification Verified: Yes Signs or symptoms of abuse/neglect since last visito No Secondary Verification Process Completed: Yes Hospitalized since last visit: No Patient Requires Transmission-Based No Implantable device outside of the clinic excluding No Precautions: cellular tissue based products placed in the center Patient Has Alerts: Yes since last visit: Patient Alerts: NOT Has Dressing in Place as Prescribed: Yes Diabetic Pain Present Now: Yes Electronic Signature(s) Signed: 04/19/2018 4:48:21 PM By: Lorine Bears RCP, RRT, CHT Entered By: Lorine Bears on 04/19/2018 14:37:59 [name] ([id number]) -------------------------------------------------------------------------------- Encounter Discharge Information Details Patient Name: [name] Date of Service: 04/19/2018 2:45 PM Medical Record Number: [id number] Patient Account Number: [id number] Date of Birth/Sex: [birth year] (67 y.o. M) Treating RN: [name] Primary Care Ronnica Dreese: [name] Other Clinician: Referring Otho Michalik: [name] Treating Buena Boehm/Extender: [name],  [name]: 20 Encounter Discharge Information Items Discharge Condition: Stable Ambulatory Status: Wheelchair Discharge Destination: Home Transportation: Private Auto Accompanied By: wife Schedule Follow-up Appointment: Yes Clinical Summary of Care: Post Procedure Vitals: Temperature (F): 99.4 Pulse (bpm): 90 Respiratory Rate (breaths/min): 16 Blood Pressure (mmHg): 126/72 Electronic Signature(s) Signed: 04/19/2018 5:23:17 PM By: [name] Entered By: [name] on 04/19/2018 15:55:07 [name] ([id number]) -------------------------------------------------------------------------------- Lower Extremity Assessment Details Patient Name: [name] Date of Service: 04/19/2018 2:45 PM Medical Record Number: [id number] Patient Account Number: [id number] Date of Birth/Sex: 01/03/1951 (67 y.o. M) Treating RN: [name] Primary Care [name]: [name] Other Clinician: Referring Mckenna Boruff: [name] [name], [name]: 20 Electronic Signature(s) Signed: 04/19/2018 4:09:02 PM By: [name] Entered By: [name] on 04/19/2018 15:02:30 [name], [name] ([id number]) -------------------------------------------------------------------------------- Multi Wound Chart Details Patient Name: [name] Date of Service: 04/19/2018 2:45 PM Medical Record Number: [id number] Patient Account Number: [id number] Date of Birth/Sex: 10-25-1950 (67 y.o. M) Treating RN: [name] Primary Care [name]: [name] Other Clinician: Referring [name]: [name] Treating [name]: [name], [name]: 20 Vital Signs Height(in): 73 Pulse(bpm): 90 Weight(lbs): 210 Blood Pressure(mmHg): 126/72 Body Mass Index(BMI): 28 Temperature(F): 99.4 Respiratory Rate 16 (breaths/min): Photos: [N/A:N/A] Wound Location: Right Gluteal fold N/A N/A Wounding  Event: Pressure Injury N/A N/A Primary Etiology: Pressure Ulcer N/A N/A Comorbid History: Cataracts, Hypertension, N/A N/A Paraplegia Date Acquired: 11/02/2017 N/A N/A Weeks of [name]: 20 N/A N/A Wound Status: Open N/A N/A Measurements L x W x D 4.4x3.1x0.2 N/A N/A (cm) Area (cm) : 10.713 N/A N/A Volume (cm) : 2.143 N/A N/A % Reduction in Area: -6.60% N/A N/A % Reduction in Volume: -113.20% N/A N/A Classification: Category/Stage III N/A N/A Exudate Amount: Medium N/A N/A Exudate Type: Serous N/A N/A Exudate Color: amber N/A N/A Wound Margin: Flat and Intact N/A N/A Granulation Amount: Small (1-33%) N/A N/A Granulation Quality: Red, Pink N/A N/A Necrotic Amount: Large (67-100%) N/A N/A Exposed  Structures: Fat Layer (Subcutaneous N/A N/A Tissue) Exposed: Yes Fascia: No Tendon: No Muscle: No Joint: No Bone: No [name], [name] ([id number]) Epithelialization: None N/A N/A Periwound Skin Texture: Excoriation: No N/A N/A Induration: No Callus: No Crepitus: No Rash: No Scarring: No Periwound Skin Moisture: Maceration: Yes N/A N/A Dry/Scaly: No Periwound Skin Color: Atrophie Blanche: No N/A N/A Cyanosis: No Ecchymosis: No Erythema: No Hemosiderin Staining: No Mottled: No Pallor: No Rubor: No Temperature: No Abnormality N/A N/A Tenderness on Palpation: No N/A N/A Wound Preparation: Ulcer Cleansing: N/A N/A Rinsed/Irrigated with Saline Topical Anesthetic Applied: Other: lidocaine 4% [name] Notes Electronic Signature(s) Signed: 04/19/2018 4:45:54 PM By: [name] Entered By: [name] on 04/19/2018 15:38:19 [name], [name] ([id number]) -------------------------------------------------------------------------------- [place] Details Patient Name: [name] Date of Service: 04/19/2018 2:45 PM Medical Record Number: [id number] Patient Account Number: [id number] Date of Birth/Sex: [birth year] (67 y.o.  M) Treating RN: [name] Primary Care Parvin Stetzer: [name] Other Clinician: Referring Shawndell Schillaci: [name] Treating Arnet Hofferber/Extender: [name], [name]: 20 Active Inactive ` Orientation to the Wound Care Program Nursing Diagnoses: Knowledge deficit related to the wound healing center program Goals: Patient/caregiver will verbalize understanding of the [place] Program Date Initiated: 11/30/2017 Target Resolution Date: 12/21/2017 Goal Status: Active Interventions: Provide education on orientation to the wound center Notes: ` Wound/Skin Impairment Nursing Diagnoses: Impaired tissue integrity Goals: Patient/caregiver will verbalize understanding of skin care regimen Date Initiated: 11/30/2017 Target Resolution Date: 12/21/2017 Goal Status: Active Ulcer/skin breakdown will have a volume reduction of 30% by week 4 Date Initiated: 11/30/2017 Target Resolution Date: 12/21/2017 Goal Status: Active Interventions: Assess patient/caregiver ability to obtain necessary supplies Assess patient/caregiver ability to perform ulcer/skin care regimen upon admission and as needed Assess ulceration(s) every visit [name] Activities: Skin care regimen initiated : 11/30/2017 Notes: Electronic Signature(s) Signed: 04/19/2018 4:45:54 PM By: Patrick North, [name] ([id number]) Entered By: [name] on 04/19/2018 15:38:10 [name], [name] ([id number]) -------------------------------------------------------------------------------- Pain Assessment Details Patient Name: [name] Date of Service: 04/19/2018 2:45 PM Medical Record Number: [id number] Patient Account Number: [id number] Date of Birth/Sex: 1950/11/08 (67 y.o. M) Treating RN: [name] Primary Care Daiya Tamer: [name] Other Clinician: Referring Fani Rotondo: [name] Treating Benjamyn Hestand/Extender: [name], [name]:  20 Active Problems Location of Pain Severity and Description of Pain Patient Has Paino Yes Site Locations Rate the pain. Current Pain Level: 5 Character of Pain Describe the Pain: Burning Pain Management and Medication Current Pain Management: Electronic Signature(s) Signed: 04/19/2018 4:45:54 PM By: [name] Signed: 04/19/2018 4:48:21 PM By: Lorine Bears RCP, RRT, CHT Entered By: Lorine Bears on 04/19/2018 14:38:16 [name], [name] ([id number]) -------------------------------------------------------------------------------- Patient/Caregiver Education Details Patient Name: [name] Date of Service: 04/19/2018 2:45 PM Medical Record Number: [id number] Patient Account Number: [id number] Date of Birth/Gender: 02-06-1951 (67 y.o. M) Treating RN: [name] Primary Care Physician: [name] Other Clinician: Referring Physician: Tracie [name] Treating Physician/Extender: Sharalyn Ink in [name]: 20 Education Assessment Education Provided To: Patient Education Topics Provided Wound Debridement: Handouts: Wound Debridement Methods: Explain/Verbal Responses: State content correctly Wound/Skin Impairment: Handouts: Caring for Your Ulcer Methods: Explain/Verbal Responses: State content correctly Electronic Signature(s) Signed: 04/19/2018 4:45:54 PM By: [name] Entered By: [name] on 04/19/2018 15:43:55 [name], [name] ([id number]) -------------------------------------------------------------------------------- Wound Assessment Details Patient Name: [name] Date of Service: 04/19/2018 2:45 PM Medical Record Number: [id number] Patient Account Number: [id number] Date of Birth/Sex: [birth year] (67 y.o. M) Treating  RN: [name] Primary Care Master Touchet: [name] Other Clinician: Referring Aleni Andrus: [name] Treating Montrey Buist/Extender: [name], Brett Weeks  in [name]: 20 Wound Status Wound Number: 1 Primary Etiology: Pressure Ulcer Wound Location: Right Gluteal fold Wound Status: Open Wounding Event: Pressure Injury Comorbid History: Cataracts, Hypertension, Paraplegia Date Acquired: 11/02/2017 Weeks Of [name]: 20 Clustered Wound: No Photos Photo Uploaded By: [name] on 04/19/2018 15:04:45 Wound Measurements Length: (cm) 4.4 Width: (cm) 3.1 Depth: (cm) 0.2 Area: (cm) 10.713 Volume: (cm) 2.143 % Reduction in Area: -6.6% % Reduction in Volume: -113.2% Epithelialization: None Tunneling: No Undermining: No Wound Description Classification: Category/Stage III Foul Odor A Wound Margin: Flat and Intact Slough/Fibr Exudate Amount: Medium Exudate Type: Serous Exudate Color: amber fter Cleansing: No ino Yes Wound Bed Granulation Amount: Small (1-33%) Exposed Structure Granulation Quality: Red, Pink Fascia Exposed: No Necrotic Amount: Large (67-100%) Fat Layer (Subcutaneous Tissue) Exposed: Yes Necrotic Quality: Adherent Slough Tendon Exposed: No Muscle Exposed: No Joint Exposed: No Bone Exposed: No Periwound Skin Texture Berres, Stevens J. ([id number]) Texture Color No Abnormalities Noted: No No Abnormalities Noted: No Callus: No Atrophie Blanche: No Crepitus: No Cyanosis: No Excoriation: No Ecchymosis: No Induration: No Erythema: No Rash: No Hemosiderin Staining: No Scarring: No Mottled: No Pallor: No Moisture Rubor: No No Abnormalities Noted: No Dry / Scaly: No Temperature / Pain Maceration: Yes Temperature: No Abnormality Wound Preparation Ulcer Cleansing: Rinsed/Irrigated with Saline Topical Anesthetic Applied: Other: lidocaine 4%, [name] Notes Wound #1 (Right Gluteal fold) 1. Cleansed with: Clean wound with Normal Saline 2. Anesthetic Topical Lidocaine 4% cream to wound bed prior to debridement 3. Peri-wound Care: Skin Prep 4. Dressing Applied: Other dressing (specify in  notes) 5. Secondary Dressing Applied Bordered Foam Dressing Notes endoform and BFD Electronic Signature(s) Signed: 04/19/2018 4:09:02 PM By: [name] Entered By: [name] on 04/19/2018 15:02:21 [name], [name] ([id number]) -------------------------------------------------------------------------------- Vitals Details Patient Name: [name] Date of Service: 04/19/2018 2:45 PM Medical Record Number: [id number] Patient Account Number: [id number] Date of Birth/Sex: 1950/12/14 (67 y.o. M) Treating RN: [name] Primary Care Tysheem Accardo: [name] Other Clinician: Referring Anaiyah Anglemyer: [name] Treating Delcenia Inman/Extender: [name], [name]: 20 Vital Signs Time Taken: 14:35 Temperature (F): 99.4 Height (in): 73 Pulse (bpm): 90 Weight (lbs): 210 Respiratory Rate (breaths/min): 16 Body Mass Index (BMI): 27.7 Blood Pressure (mmHg): 126/72 Reference Range: 80 - 120 mg / dl Electronic Signature(s) Signed: 04/19/2018 4:48:21 PM By: Lorine Bears RCP, RRT, CHT Entered By: Lorine Bears on 04/19/2018 14:40:37

## 2018-04-26 ENCOUNTER — Encounter: Payer: Medicare Other | Attending: Physician Assistant | Admitting: Physician Assistant

## 2018-04-26 DIAGNOSIS — Z8249 Family history of ischemic heart disease and other diseases of the circulatory system: Secondary | ICD-10-CM | POA: Insufficient documentation

## 2018-04-26 DIAGNOSIS — I10 Essential (primary) hypertension: Secondary | ICD-10-CM | POA: Diagnosis not present

## 2018-04-26 DIAGNOSIS — G8220 Paraplegia, unspecified: Secondary | ICD-10-CM | POA: Insufficient documentation

## 2018-04-26 DIAGNOSIS — L89313 Pressure ulcer of right buttock, stage 3: Secondary | ICD-10-CM | POA: Insufficient documentation

## 2018-04-26 DIAGNOSIS — Z8546 Personal history of malignant neoplasm of prostate: Secondary | ICD-10-CM | POA: Insufficient documentation

## 2018-04-26 DIAGNOSIS — Z823 Family history of stroke: Secondary | ICD-10-CM | POA: Diagnosis not present

## 2018-04-29 NOTE — Progress Notes (Signed)
[name] ([id number]) Visit Report for 04/26/2018 Arrival Information Details Patient Name: [name] Date of Service: 04/26/2018 10:00 AM Medical Record Number: [id number] Patient Account Number: [id number] Date of Birth/Sex: 10-19-1950 (67 y.o. M) Treating RN: Roger Shelter Primary Care Crystalmarie Yasin: [name] Other Clinician: Referring Mouhamed Glassco: [name] Treating [name]/Extender: [name], HOYT Weeks in Treatment: 21 Visit Information History Since Last Visit Added or deleted any medications: No Patient Arrived: Wheel Chair Any new allergies or adverse reactions: No Arrival Time: 09:55 Had a fall or experienced change in No Accompanied By: wife activities of daily living that may affect Transfer Assistance: None risk of falls: Patient Identification Verified: Yes Signs or symptoms of abuse/neglect since last visito No Secondary Verification Process Completed: Yes Hospitalized since last visit: No Patient Requires Transmission-Based No Implantable device outside of the clinic excluding No Precautions: cellular tissue based products placed in the center Patient Has Alerts: Yes since last visit: Patient Alerts: NOT Has Dressing in Place as Prescribed: Yes Diabetic Pain Present Now: Yes Electronic Signature(s) Signed: 04/26/2018 11:56:54 AM By: Lorine Bears RCP, RRT, CHT Entered By: Lorine Bears on 04/26/2018 09:56:17 [name] ([id number]) -------------------------------------------------------------------------------- Clinic Level of Care Assessment Details Patient Name: [name] Date of Service: 04/26/2018 10:00 AM Medical Record Number: [id number] Patient Account Number: [id number] Date of Birth/Sex: June 01, 1951 (67 y.o. M) Treating RN: Roger Shelter Primary Care Percilla Tweten: [name] Other Clinician: Referring [name]: [name] Treating Petar Mucci/Extender: [name], HOYT Weeks in Treatment: 21 Clinic Level of Care Assessment Items TOOL 4 Quantity Score X - Use when only an EandM is performed on FOLLOW-UP visit 1 0 ASSESSMENTS - Nursing Assessment / Reassessment X - Reassessment of Co-morbidities (includes updates in patient status) 1 10 X- 1 5 Reassessment of Adherence to Treatment Plan ASSESSMENTS - Wound and Skin Assessment / Reassessment X - Simple Wound Assessment / Reassessment - one wound 1 5 []  - 0 Complex Wound Assessment / Reassessment - multiple wounds []  - 0 Dermatologic / Skin Assessment (not related to wound area) ASSESSMENTS - Focused Assessment []  - Circumferential Edema Measurements - multi extremities 0 []  - 0 Nutritional Assessment / Counseling / Intervention []  - 0 Lower Extremity Assessment (monofilament, tuning fork, pulses) []  - 0 Peripheral Arterial Disease Assessment (using hand held doppler) ASSESSMENTS - Ostomy and/or Continence Assessment and Care []  - Incontinence Assessment and Management 0 []  - 0 Ostomy Care Assessment and Management (repouching, etc.) PROCESS - Coordination of Care X - Simple Patient / Family Education for ongoing care 1 15 []  - 0 Complex (extensive) Patient / Family Education for ongoing care []  - 0 Staff obtains Programmer, systems, Records, Test Results / Process Orders []  - 0 Staff telephones HHA, Nursing Homes / Clarify orders / etc []  - 0 Routine Transfer to another Facility (non-emergent condition) []  - 0 Routine Hospital Admission (non-emergent condition) []  - 0 New Admissions / Biomedical engineer / Ordering NPWT, Apligraf, etc. []  - 0 Emergency Hospital Admission (emergent condition) X- 1 10 Simple Discharge Coordination [name]. ([id number]) []  - 0 Complex (extensive) Discharge Coordination PROCESS - Special Needs []  - Pediatric / Minor Patient Management 0 []  - 0 Isolation Patient Management []  - 0 Hearing / Language / Visual special needs []  -  0 Assessment of Community assistance (transportation, D/C planning, etc.) []  - 0 Additional assistance / Altered mentation []  - 0 Support Surface(s) Assessment (bed, cushion, seat, etc.) INTERVENTIONS - Wound Cleansing / Measurement X - Simple Wound  Cleansing - one wound 1 5 []  - 0 Complex Wound Cleansing - multiple wounds X- 1 5 Wound Imaging (photographs - any number of wounds) []  - 0 Wound Tracing (instead of photographs) X- 1 5 Simple Wound Measurement - one wound []  - 0 Complex Wound Measurement - multiple wounds INTERVENTIONS - Wound Dressings X - Small Wound Dressing one or multiple wounds 1 10 []  - 0 Medium Wound Dressing one or multiple wounds []  - 0 Large Wound Dressing one or multiple wounds []  - 0 Application of Medications - topical []  - 0 Application of Medications - injection INTERVENTIONS - Miscellaneous []  - External ear exam 0 []  - 0 Specimen Collection (cultures, biopsies, blood, body fluids, etc.) []  - 0 Specimen(s) / Culture(s) sent or taken to Lab for analysis []  - 0 Patient Transfer (multiple staff / Civil Service fast streamer / Similar devices) []  - 0 Simple Staple / Suture removal (25 or less) []  - 0 Complex Staple / Suture removal (26 or more) []  - 0 Hypo / Hyperglycemic Management (close monitor of Blood Glucose) []  - 0 Ankle / Brachial Index (ABI) - do not check if billed separately X- 1 5 Vital Signs [name], [name] ([id number]) Has the patient been seen at the hospital within the last three years: Yes Total Score: 75 Level Of Care: New/Established - Level 2 Electronic Signature(s) Signed: 04/26/2018 4:35:19 PM By: Roger Shelter Entered By: Roger Shelter on 04/26/2018 10:26:37 [name] ([id number]) -------------------------------------------------------------------------------- Lower Extremity Assessment Details Patient Name: [name] Date of Service: 04/26/2018 10:00 AM Medical Record Number: [id number] Patient  Account Number: [id number] Date of Birth/Sex: July 29, 1950 (67 y.o. M) Treating RN: Secundino Ginger Primary Care [name]: [name] Other Clinician: Referring [name]: [name] Treating [name]/Extender: [name], HOYT Weeks in Treatment: 21 Electronic Signature(s) Signed: 04/26/2018 11:25:08 AM By: Secundino Ginger Entered By: Secundino Ginger on 04/26/2018 10:03:48 TYREASE, VANDEBERG ([id number]) -------------------------------------------------------------------------------- Multi Wound Chart Details Patient Name: [name] Date of Service: 04/26/2018 10:00 AM Medical Record Number: [id number] Patient Account Number: [id number] Date of Birth/Sex: [birth year] (67 y.o. M) Treating RN: Roger Shelter Primary Care Anaaya Fuster: [name] Other Clinician: Referring Tanner Vigna: [name] Treating Abimelec Grochowski/Extender: [name], HOYT Weeks in Treatment: 21 Vital Signs Height(in): 73 Pulse(bpm): 85 Weight(lbs): 210 Blood Pressure(mmHg): 126/78 Body Mass Index(BMI): 28 Temperature(F): 99.0 Respiratory Rate 16 (breaths/min): Photos: [N/A:N/A] Wound Location: Right Gluteal fold N/A N/A Wounding Event: Pressure Injury N/A N/A Primary Etiology: Pressure Ulcer N/A N/A Comorbid History: Cataracts, Hypertension, N/A N/A Paraplegia Date Acquired: 11/02/2017 N/A N/A Weeks of Treatment: 21 N/A N/A Wound Status: Open N/A N/A Measurements L x W x D 4.4x3x0.2 N/A N/A (cm) Area (cm) : 10.367 N/A N/A Volume (cm) : 2.073 N/A N/A % Reduction in Area: -3.10% N/A N/A % Reduction in Volume: -106.30% N/A N/A Classification: Category/Stage III N/A N/A Exudate Amount: Large N/A N/A Exudate Type: Serous N/A N/A Exudate Color: amber N/A N/A Wound Margin: Flat and Intact N/A N/A Granulation Amount: Medium (34-66%) N/A N/A Granulation Quality: Red, Pink N/A N/A Necrotic Amount: Medium (34-66%) N/A N/A Exposed Structures: Fat Layer (Subcutaneous N/A N/A Tissue) Exposed:  Yes Fascia: No Tendon: No Muscle: No Joint: No Bone: No Homes, Jaydian J. ([id number]) Epithelialization: None N/A N/A Periwound Skin Texture: Excoriation: No N/A N/A Induration: No Callus: No Crepitus: No Rash: No Scarring: No Periwound Skin Moisture: Maceration: Yes N/A N/A Dry/Scaly: No Periwound Skin Color: Atrophie Blanche: No N/A N/A Cyanosis: No Ecchymosis: No Erythema: No Hemosiderin Staining: No  Mottled: No Pallor: No Rubor: No Temperature: No Abnormality N/A N/A Tenderness on Palpation: No N/A N/A Wound Preparation: Ulcer Cleansing: N/A N/A Rinsed/Irrigated with Saline Topical Anesthetic Applied: Other: lidocaine 4% Treatment Notes Electronic Signature(s) Signed: 04/26/2018 4:35:19 PM By: Roger Shelter Entered By: Roger Shelter on 04/26/2018 10:18:44 BAWI, LAKINS ([id number]) -------------------------------------------------------------------------------- Comfort Details Patient Name: [name] Date of Service: 04/26/2018 10:00 AM Medical Record Number: [id number] Patient Account Number: [id number] Date of Birth/Sex: 1950/07/25 (67 y.o. M) Treating RN: Roger Shelter Primary Care Eliberto Sole: [name] Other Clinician: Referring [name] Laughridge: [name] Treating Arlyn Buerkle/Extender: [name], HOYT Weeks in Treatment: 21 Active Inactive ` Orientation to the Wound Care Program Nursing Diagnoses: Knowledge deficit related to the wound healing center program Goals: Patient/caregiver will verbalize understanding of the [facility] Program Date Initiated: 11/30/2017 Target Resolution Date: 12/21/2017 Goal Status: Active Interventions: Provide education on orientation to the wound center Notes: ` Wound/Skin Impairment Nursing Diagnoses: Impaired tissue integrity Goals: Patient/caregiver will verbalize understanding of skin care regimen Date Initiated: 11/30/2017 Target Resolution Date:  12/21/2017 Goal Status: Active Ulcer/skin breakdown will have a volume reduction of 30% by week 4 Date Initiated: 11/30/2017 Target Resolution Date: 12/21/2017 Goal Status: Active Interventions: Assess patient/caregiver ability to obtain necessary supplies Assess patient/caregiver ability to perform ulcer/skin care regimen upon admission and as needed Assess ulceration(s) every visit Treatment Activities: Skin care regimen initiated : 11/30/2017 Notes: Electronic Signature(s) Signed: 04/26/2018 4:35:19 PM By: Patrick North, [name] ([id number]) Entered By: Roger Shelter on 04/26/2018 10:18:36 HALL, BIRCHARD ([id number]) -------------------------------------------------------------------------------- Pain Assessment Details Patient Name: [name] Date of Service: 04/26/2018 10:00 AM Medical Record Number: [id number] Patient Account Number: [id number] Date of Birth/Sex: 1951/03/28 (67 y.o. M) Treating RN: Roger Shelter Primary Care Raul Winterhalter: [name] Other Clinician: Referring Mason Dibiasio: [name] Treating Tyler Cubit/Extender: [name], HOYT Weeks in Treatment: 21 Active Problems Location of Pain Severity and Description of Pain Patient Has Paino Yes Site Locations Rate the pain. Current Pain Level: 4 Pain Management and Medication Current Pain Management: Electronic Signature(s) Signed: 04/26/2018 11:56:54 AM By: Lorine Bears RCP, RRT, CHT Signed: 04/26/2018 4:35:19 PM By: Roger Shelter Entered By: Lorine Bears on 04/26/2018 09:56:30 TEMPLE, SPORER ([id number]) -------------------------------------------------------------------------------- Patient/Caregiver Education Details Patient Name: [name] Date of Service: 04/26/2018 10:00 AM Medical Record Number: [id number] Patient Account Number: [id number] Date of Birth/Gender: [birth year] (67 y.o. M) Treating RN: Roger Shelter Primary  Care Physician: [name] Other Clinician: Referring Physician: [name] Treating Physician/Extender: Sharalyn Ink in Treatment: 21 Education Assessment Education Provided To: Patient and Caregiver Education Topics Provided Wound Debridement: Handouts: Wound Debridement Methods: Explain/Verbal Responses: State content correctly Wound/Skin Impairment: Handouts: Caring for Your Ulcer Methods: Explain/Verbal Responses: State content correctly Electronic Signature(s) Signed: 04/26/2018 4:35:19 PM By: Roger Shelter Entered By: Roger Shelter on 04/26/2018 10:27:10 [name] ([id number]) -------------------------------------------------------------------------------- Wound Assessment Details Patient Name: [name] Date of Service: 04/26/2018 10:00 AM Medical Record Number: [id number] Patient Account Number: [id number] Date of Birth/Sex: December 31, 1950 (67 y.o. M) Treating RN: Secundino Ginger Primary Care Sharanya Templin: [name] Other Clinician: Referring Ayham Word: [name] Treating Catharine Kettlewell/Extender: [name], HOYT Weeks in Treatment: 21 Wound Status Wound Number: 1 Primary Etiology: Pressure Ulcer Wound Location: Right Gluteal fold Wound Status: Open Wounding Event: Pressure Injury Comorbid History: Cataracts, Hypertension, Paraplegia Date Acquired: 11/02/2017 Weeks Of Treatment: 21 Clustered Wound: No Photos Wound Measurements Length: (cm) 4.4 % Reduction in Width: (cm) 3 % Reduction in Depth: (  cm) 0.2 Epithelializat Area: (cm) 10.367 Tunneling: Volume: (cm) 2.073 Undermining: Area: -3.1% Volume: -106.3% ion: None No No Wound Description Classification: Category/Stage III Foul Odor Afte Wound Margin: Flat and Intact Slough/Fibrino Exudate Amount: Large Exudate Type: Serous Exudate Color: amber r Cleansing: No Yes Wound Bed Granulation Amount: Medium (34-66%) Exposed Structure Granulation Quality: Red,  Pink Fascia Exposed: No Necrotic Amount: Medium (34-66%) Fat Layer (Subcutaneous Tissue) Exposed: Yes Necrotic Quality: Adherent Slough Tendon Exposed: No Muscle Exposed: No Joint Exposed: No Bone Exposed: No Periwound Skin Texture Texture Color Newsom, [place] J. ([id number]) No Abnormalities Noted: No No Abnormalities Noted: No Callus: No Atrophie Blanche: No Crepitus: No Cyanosis: No Excoriation: No Ecchymosis: No Induration: No Erythema: No Rash: No Hemosiderin Staining: No Scarring: No Mottled: No Pallor: No Moisture Rubor: No No Abnormalities Noted: No Dry / Scaly: No Temperature / Pain Maceration: Yes Temperature: No Abnormality Wound Preparation Ulcer Cleansing: Rinsed/Irrigated with Saline Topical Anesthetic Applied: Other: lidocaine 4%, Electronic Signature(s) Signed: 04/26/2018 11:25:08 AM By: Secundino Ginger Signed: 04/26/2018 4:35:19 PM By: Roger Shelter Entered By: Roger Shelter on 04/26/2018 10:22:28 JAEGER, TRUEHEART ([id number]) -------------------------------------------------------------------------------- Vitals Details Patient Name: [name] Date of Service: 04/26/2018 10:00 AM Medical Record Number: [id number] Patient Account Number: [id number] Date of Birth/Sex: 1951/07/03 (67 y.o. M) Treating RN: Roger Shelter Primary Care Janasia Coverdale: [name] Other Clinician: Referring Kennethia Lynes: [name] Treating Coni Homesley/Extender: [name], HOYT Weeks in Treatment: 21 Vital Signs Time Taken: 09:50 Temperature (F): 99.0 Height (in): 73 Pulse (bpm): 85 Weight (lbs): 210 Respiratory Rate (breaths/min): 16 Body Mass Index (BMI): 27.7 Blood Pressure (mmHg): 126/78 Reference Range: 80 - 120 mg / dl Notes oral temp retaken at 10:00 am 98.8 Electronic Signature(s) Signed: 04/26/2018 11:25:08 AM By: Secundino Ginger Entered BySecundino Ginger on 04/26/2018 10:18:55

## 2018-04-29 NOTE — Progress Notes (Signed)
[name] ([id number]) Visit Report for 04/26/2018 Chief Complaint Document Details Patient Name: [name] Date of Service: 04/26/2018 10:00 AM Medical Record Number: [id number] Patient Account Number: [id number] Date of Birth/Sex: Nov 22, 1950 (67 y.o. M) Treating RN: Roger Shelter Primary Care Provider: [name] Other Clinician: Referring Provider: [name] Treating Provider/Extender: [name], HOYT Weeks in Treatment: 21 Information Obtained from: Patient Chief Complaint Right gluteal fold ulcer Electronic Signature(s) Signed: 04/26/2018 1:33:13 PM By: Worthy Keeler [name] Entered By: Worthy Keeler on 04/26/2018 09:48:42 ELSIE, SAKUMA ([id number]) -------------------------------------------------------------------------------- Debridement Details Patient Name: [name] Date of Service: 04/26/2018 10:00 AM Medical Record Number: [id number] Patient Account Number: [id number] Date of Birth/Sex: 07-24-1950 (67 y.o. M) Treating RN: Roger Shelter Primary Care Provider: [name] Other Clinician: Referring Provider: [name] Treating Provider/Extender: [name], HOYT Weeks in Treatment: 21 Debridement Performed for Wound #1 Right Gluteal fold Assessment: Performed By: Physician [name], HOYT E., [name] Debridement Type: Debridement Level of Consciousness (Pre- Awake and Alert procedure): Pre-procedure Verification/Time Yes - 10:21 Out Taken: Start Time: 10:21 Pain Control: Other : lidocaine 4% Total Area Debrided (L x W): 4.4 (cm) x 3 (cm) = 13.2 (cm) Tissue and other material Viable, Non-Viable, Slough, Subcutaneous, Biofilm, Slough debrided: Level: Skin/Subcutaneous Tissue Debridement Description: Excisional Instrument: Curette Bleeding: Minimum Hemostasis Achieved: Pressure End Time: 10:22 Procedural Pain: 0 Post Procedural Pain: 0 Response to Treatment: Procedure was tolerated  well Level of Consciousness Awake and Alert (Post-procedure): Post Debridement Measurements of Total Wound Length: (cm) 4.4 Stage: Category/Stage III Width: (cm) 3 Depth: (cm) 0.2 Volume: (cm) 2.073 Character of Wound/Ulcer Post Stable Debridement: Post Procedure Diagnosis Same as Pre-procedure Electronic Signature(s) Signed: 04/26/2018 1:33:13 PM By: Worthy Keeler [name] Signed: 04/26/2018 4:35:19 PM By: Roger Shelter Entered By: Roger Shelter on 04/26/2018 10:22:10 DERYL, GIROUX ([id number]) -------------------------------------------------------------------------------- HPI Details Patient Name: [name] Date of Service: 04/26/2018 10:00 AM Medical Record Number: [id number] Patient Account Number: [id number] Date of Birth/Sex: [birth year] (67 y.o. 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 He states that there is no history of diabetes that he is aware of. He does have issues with his prostate and is currently receiving treatment for this by way of oral medication. With that being said I do not have a lot of details in that regard. Nonetheless the patient presents today as a result of having been referred to [place] by another provider initially home health was set to come out and take care of his wound although due to the fact that he apparently drives he's not able to receive home health. His wife is therefore trying to help take care of this wound within although they have been struggling with what exactly to  do at this point. She states that she can do some things but she is definitely not a nurse and does have some issues with looking at blood. The good news is the wound does not appear to be too deep and is fairly superficial at this point. There is no slough noted there is some nonviable skin noted around the  surface of the wound and the perimeter at this point. The central portion of the wound appears to be very good with a dermal layer noted this does not appear to be again deep enough to extend it to subcutaneous tissue at this point. 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 In the pump he has Dilaudid and baclofen. 12/07/17 on evaluation today patient presents for follow-up concerning his ongoing lower back thigh ulcer on the right. He states that he did not get the supplies ordered and therefore has not really been able to perform the dressing changes as directed exactly. His wife was able to get some Boarder Foam Dressing's from the drugstore and subsequently has been using hydrogel which did help to a degree in the wound does appear to be able smaller. There is actually more drainage this week noted than previous. 12/21/17 on evaluation today patient appears to be doing rather well in regard to his right gluteal ulcer. He has been tolerating the dressing changes without complication. There does not appear to be any evidence of infection at this point in time. Overall the wound does seem to be making some progress as far as the edges are concerned there's not as much in the way of overlapping of the external wound edges and he has a good epithelium to wound bed border for the most part. This however is not true right at the 12 o'clock location over the span of a little over a centimeters which actually will require debridement today to clean this away and hopefully allow it to continue to  heal more appropriately. 12/28/17 on evaluation today patient appears to be doing rather well in regard to his ulcer in the left gluteal region. He's been tolerating the dressing changes without complication. Apparently he has had some difficulty getting his dressing material. Apparently there's been some confusion with ordering we're gonna check into this. Nonetheless overall he's been showing signs of improvement which is good news. Debridement is not required today. 01/04/18 on evaluation today patient presents for follow-up concerning his right gluteal ulcer. He has been tolerating the dressing changes fairly well. On inspection today it appears he may actually have some maceration them concerned about the fact that he may be developing too much moisture in and around the wound bed which can cause delay in healing. With that being said he unfortunately really has not showed significant signs of improvement since last week's evaluation in fact this may even be just the little bit/slightly larger. Nonetheless he's been having a lot of discomfort I'm not sure this is even related to the wound as he has no pain when I'm to breeding or otherwise cleaning the wound during evaluation today. Nonetheless this is something that we did recommend he talked to his pain specialist concerning. 01/11/18 on evaluation today patient appears to be doing better in regard to his ulceration. He has been tolerating the dressing changes without complication. With that being said overall there's no evidence of infection which is good news. The only thing is he did receive the hatch affair blue classic versus the ready nonetheless I feel like this is perfectly fine and appears to have done well for him over the past week. 01/25/18 on evaluation today patient's wound actually appears to be a little bit larger than during the last evaluation. The good GOLDIE, DIMMER. ([id number]) news is the majority  of the wound edges  actually appear to be fairly firmly attached to the wound bed unfortunately again we're not really making progress in regard to the size. Roughly the wound is about the same size as when I first saw him although again the wound margin/edges appear to be much better. 02/01/18 on evaluation today patient actually appears to be doing very well in regard to his wound. Applying the Prisma dry does seem to be better although he does still have issues with slow progression of the wound. There was a slight improvement compared to last week's measurements today. Nonetheless I have been considering other options as far as the possibility of Theraskin or even a snap vac. In general I'm not sure that the Theraskin due to location of the wound would be a very good idea. Nonetheless I do think that a snap vac could be a possibility for the patient and in fact I think this could even be an excellent way to manage the wound possibly seeing some improvement in a very rapid fashion here. Nonetheless this is something that we would need to get approved and I did have a lengthy conversation with the patient about this today. 02/08/18 on evaluation today patient appears to be doing a little better in regard to his ulcer. He has been tolerating the dressing changes without complication. Fortunately despite the fact that the wound is a little bit smaller it's not significantly so unfortunately. We have discussed the possibility of a snap vac we did check with insurance this is actually covered at this point. Fortunately there does not appear to be any sign of infection. Overall I'm fairly pleased with how things seem to be appearing at this point. 02/15/18 on evaluation today patient appears to be doing rather well in regard to his right gluteal ulcer. Unfortunately the snap vac did not stay in place with his sheer and friction this came loose and did not seem to maintain seal very well. He worked for about two days and it  did seem to do very well during that time according to his wife but in general this does not seem to be something that's gonna be beneficial for him long-term. I do believe we need to go back to standard dressings to see if we can find something that will be of benefit. 03/02/18- He is here in follow up evaluation; there is minimal change in the wound. He will continue with the same treatment plan, would consider changing to iodosrob/iodoflex if ulcer continues to to plateau. He will follow up next week 03/08/18 on evaluation today patient's wound actually appears to be about the same size as when I previously saw him several weeks back. Unfortunately he does have some slightly dark discoloration in the central portion of the wound which has me concerned about pressure injury. I do believe he may be sitting for too long a period of time in fact he tells me that "I probably sit for much too long". He does have some Slough noted on the surface of the wound and again as far as the size of the wound is concerned I'm really not seeing anything that seems to have improved significantly. 03/15/18 on evaluation today patient appears to be doing fairly well in regard to his ulcer. The wound measured pretty much about the same today compared to last week's evaluation when looking at his graph. With that being said the area of bruising/deep tissue injury that was noted last week I do not  see at this point. He did get a new cushion fortunately this does seem to be have been of benefit in my pinion. It does appear that he's been off of this more which is good news as well I think that is definitely showing in the overall wound measurements. With that being said I do believe that he needs to continue to offload I don't think that the fact this is doing better should be or is going to allow him to not have to offload and explain this to him as well. Overall he seems to be in agreement the plan I think he understands.  The overall appearance of the wound bed is improved compared to last week I think the Iodoflex has been beneficial in that regard. 03/29/18 on evaluation today patient actually appears to be doing rather well in regard to his wound from the overall appearance standpoint he does have some granulation although there's some Slough on the surface of the wound noted as well. With that being said he unfortunately has not improved in regard to the overall measurement of the wound in volume or in size. I did have a discussion with him very specifically about offloading today. He actually does work although he mainly is just sitting throughout the day. He tells me he offloads by "lifting himself up for 30 seconds off of his chair occasionally" purchase from [employer] which does seem to have helped. And he has a new cushion that he with that being said he's also able to stand some for a very short period of time but not significant enough I think to provide appropriate offloading. I think the biggest issue at this point with the wound and the fact is not healing as quickly as we would like is due to the fact that he is really not able to appropriately offload while at work. He states the beginning after his injury he actually had a bed at his job that he could lay on in order to offload and that does seem to have been of help back at that time. Nonetheless he had not done this in quite some time unfortunately. I think that could be helpful for him this is something I would like for him to look into. 04/05/18 on evaluation today patient actually presents for follow-up concerning his right gluteal ulcer. Again he really is not significantly improved even compared to last week. He has been tolerating the dressing changes without complication. With that being said fortunately there appears to be no evidence of infection at this time. He has been more proactive in trying to offload. DEHAVEN, SINE  ([id number]) 04/12/18 on evaluation today patient actually appears to be doing a little better in regard to his wound and the right gluteal fold region. He's been tolerating the dressing changes since removing the oasis without complication. However he was having a lot of burning initially with the oasis in place. He's unsure of exactly why this was given so much discomfort but he assumes that it was the oasis itself causing the problem. Nonetheless this had to be removed after about three days in place although even those three days seem to have made a fairly good improvement in regard to the overall appearance of the wound bed. In fact is the first time that he's made any improvement from the standpoint of measurements in about six weeks. He continues to have no discomfort over the area of the wound itself which leads me to wonder why  he was having the burning with the oasis when he does not even feel the actual debridement's themselves. I am somewhat perplexed by this. 04/19/18 on evaluation today patient's wound actually appears to be showing signs of epithelialization around the edge of the wound and in general actually appears to be doing better which is good news. He did have the same burning after about three days with applying the Endoform last week in the same fashion that I would generally apply a skin substitute. This seems to indicate that it's not the oasis to cause the problem but potentially the moisture buildup that just causes things to burn or there may be some other reaction with the skin prep or Steri-Strips. Nonetheless I'm not sure that is gonna be able to tolerate any skin substitute for a long period of time. The good news is the wound actually appears to be doing better today compared to last week and does seem to finally be making some progress. 04/26/18 on evaluation today patient actually appears to be doing rather well in regard to his ulcer in the right gluteal fold.  He has been tolerating the dressing changes without complication which is good news. The Endoform does seem to be helping although he was a little bit more macerated this week. This seems to be an ongoing issue with fluid control at this point. Nonetheless I think we may be able to add something like Drawtex to help control the drainage. Electronic Signature(s) Signed: 04/26/2018 1:33:13 PM By: Worthy Keeler [name] Entered By: Worthy Keeler on 04/26/2018 11:02:35 DARIS, HARKINS ([id number]) -------------------------------------------------------------------------------- Physical Exam Details Patient Name: [name] Date of Service: 04/26/2018 10:00 AM Medical Record Number: [id number] Patient Account Number: [id number] Date of Birth/Sex: 1950/09/13 (67 y.o. M) Treating RN: Roger Shelter Primary Care Provider: [name] Other Clinician: Referring Provider: [name] Treating Provider/Extender: [name], HOYT Weeks in Treatment: 21 Constitutional Well-nourished and well-hydrated in no acute distress. Respiratory normal breathing without difficulty. Psychiatric this patient is able to make decisions and demonstrates good insight into disease process. Alert and Oriented x 3. pleasant and cooperative. Notes On inspection today patient's wound bed did have some Slough noted on the surface of the wound which required sharp debridement this point. Post debridement the wound bed appears to be doing significantly better there does not appear to be any evidence of infection which is great news. Electronic Signature(s) Signed: 04/26/2018 1:33:13 PM By: Worthy Keeler [name] Entered By: Worthy Keeler on 04/26/2018 12:54:18 NIRVAAN, FRETT ([id number]) -------------------------------------------------------------------------------- Physician Orders Details Patient Name: [name] Date of Service: 04/26/2018 10:00 AM Medical Record Number:  [id number] Patient Account Number: [id number] Date of Birth/Sex: 05/21/1951 (67 y.o. 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 Skin Barriers/Peri-Wound Care Wound #1 Right Gluteal fold o Skin Prep Primary Wound Dressing Wound #1 Right Gluteal fold o Other: - endoform Secondary Dressing Wound #1 Right Gluteal fold o Boardered  Foam Dressing o Drawtex - place over the endoform Dressing Change Frequency Wound #1 Right Gluteal fold o Change dressing every week - outer dressing may be changed if it gets wet. If dressing comes off may replace with endoform Follow-up Appointments Wound #1 Right Gluteal fold o Return Appointment in 1 week. Off-Loading Wound #1 Right Gluteal fold o Other: - Keep pressure off of area WINIFRED, BALOGH ([id number]) Electronic Signature(s) Signed: 04/26/2018 1:33:13 PM By: Worthy Keeler [name] Signed: 04/26/2018 4:35:19 PM By: Roger Shelter Entered By: Roger Shelter on 04/26/2018 10:26:08 KEALII, THUESON ([id number]) -------------------------------------------------------------------------------- Problem List Details Patient Name: [name] Date of Service: 04/26/2018 10:00 AM Medical Record Number: [id number] Patient Account Number: [id number] Date of  Birth/Sex: 09-23-1950 (67 y.o. 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 He states that there is no history of diabetes that he is aware of. He does have issues with his prostate and is currently receiving treatment for this by way of oral medication. With that being said I do not have a lot of details in that regard. Nonetheless the patient  presents today as a result of having been referred to [place] by another provider initially home health was set to come out and take care of his wound although due to the fact that he apparently drives he's not able to receive home health. His wife is therefore trying to help take care of this wound within although they have been struggling with what exactly to do at this point. She states that she can do some things but she is definitely not a nurse and does have some issues with looking at blood. The good news is the wound does not appear to be too deep and is fairly superficial at this point. There is no slough noted there is some nonviable skin noted around the surface of the wound and the perimeter at this point. The central portion of the wound appears to be very good with a dermal layer noted this does not appear to be again deep enough to extend it to subcutaneous tissue at this point. 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 In the pump he has  Dilaudid and baclofen. 12/07/17 on evaluation today patient presents for follow-up concerning his ongoing lower back thigh ulcer on the right. He states that he did not get the supplies ordered and therefore has not really been able to perform the dressing changes as directed exactly. His wife was able to get some Boarder Foam Dressing's from the drugstore and subsequently has been using hydrogel which did help to a degree in the wound does appear to be able smaller. There is actually more drainage this week noted than previous. 12/21/17 on evaluation today patient appears to be doing rather well in regard to his right gluteal ulcer. He has been tolerating the dressing changes without complication. There does not appear to be any evidence of infection at this point in time. Overall the wound does seem to be making some progress as far as the edges are  concerned there's not as much in the way of overlapping of the external wound edges and he has a good epithelium to wound bed border for the most part. This however is not true right at the 12 o'clock location over the span of a little over a centimeters which actually will require debridement today to clean this away and hopefully allow it to continue to heal more appropriately. 12/28/17 on evaluation today patient appears to be doing rather well in regard to his ulcer in the left gluteal region. He's been tolerating the dressing changes without complication. Apparently he has had some difficulty getting his dressing material. Apparently there's been some confusion with ordering we're gonna check into this. Nonetheless overall he's been showing signs of improvement which is good news. Debridement is not required today. 01/04/18 on evaluation today patient presents for follow-up concerning his right gluteal ulcer. He has been tolerating the dressing changes fairly well. On inspection today it appears he may actually have some maceration them concerned about the fact that he may be developing too much moisture in and around the wound bed which can cause delay in healing. With that being said he unfortunately really has not showed significant signs of improvement since last week's evaluation in fact this may even be just the little bit/slightly larger. Nonetheless he's been having a lot of discomfort I'm not sure this is even related to the wound as he has no pain when I'm to breeding or otherwise cleaning the wound during evaluation today. Nonetheless this is something that we did recommend he talked to his pain specialist concerning. 01/11/18 on evaluation today patient appears to be doing better in regard to his ulceration. He has been tolerating the dressing Spivak, Marvyn J. ([id number]) changes without complication. With that being said overall there's no evidence of infection which is good news.  The only thing is he did receive the hatch affair blue classic versus the ready nonetheless I feel like this is perfectly fine and appears to have done well for him over the past week. 01/25/18 on evaluation today patient's wound actually appears to be a little bit larger than during the last evaluation. The good news is the majority of the wound edges actually appear to be fairly firmly attached to the wound bed unfortunately again we're not really making progress in regard to the size. Roughly the wound is about the same size as when I first saw him although again the wound margin/edges appear to be much better. 02/01/18 on evaluation today patient actually appears to be doing very well in regard to his wound. Applying the Prisma dry does seem  to be better although he does still have issues with slow progression of the wound. There was a slight improvement compared to last week's measurements today. Nonetheless I have been considering other options as far as the possibility of Theraskin or even a snap vac. In general I'm not sure that the Theraskin due to location of the wound would be a very good idea. Nonetheless I do think that a snap vac could be a possibility for the patient and in fact I think this could even be an excellent way to manage the wound possibly seeing some improvement in a very rapid fashion here. Nonetheless this is something that we would need to get approved and I did have a lengthy conversation with the patient about this today. 02/08/18 on evaluation today patient appears to be doing a little better in regard to his ulcer. He has been tolerating the dressing changes without complication. Fortunately despite the fact that the wound is a little bit smaller it's not significantly so unfortunately. We have discussed the possibility of a snap vac we did check with insurance this is actually covered at this point. Fortunately there does not appear to be any sign of infection. Overall  I'm fairly pleased with how things seem to be appearing at this point. 02/15/18 on evaluation today patient appears to be doing rather well in regard to his right gluteal ulcer. Unfortunately the snap vac did not stay in place with his sheer and friction this came loose and did not seem to maintain seal very well. He worked for about two days and it did seem to do very well during that time according to his wife but in general this does not seem to be something that's gonna be beneficial for him long-term. I do believe we need to go back to standard dressings to see if we can find something that will be of benefit. 03/02/18- He is here in follow up evaluation; there is minimal change in the wound. He will continue with the same treatment plan, would consider changing to iodosrob/iodoflex if ulcer continues to to plateau. He will follow up next week 03/08/18 on evaluation today patient's wound actually appears to be about the same size as when I previously saw him several weeks back. Unfortunately he does have some slightly dark discoloration in the central portion of the wound which has me concerned about pressure injury. I do believe he may be sitting for too long a period of time in fact he tells me that "I probably sit for much too long". He does have some Slough noted on the surface of the wound and again as far as the size of the wound is concerned I'm really not seeing anything that seems to have improved significantly. 03/15/18 on evaluation today patient appears to be doing fairly well in regard to his ulcer. The wound measured pretty much about the same today compared to last week's evaluation when looking at his graph. With that being said the area of bruising/deep tissue injury that was noted last week I do not see at this point. He did get a new cushion fortunately this does seem to be have been of benefit in my pinion. It does appear that he's been off of this more which is good news as  well I think that is definitely showing in the overall wound measurements. With that being said I do believe that he needs to continue to offload I don't think that the fact this is doing better  should be or is going to allow him to not have to offload and explain this to him as well. Overall he seems to be in agreement the plan I think he understands. The overall appearance of the wound bed is improved compared to last week I think the Iodoflex has been beneficial in that regard. 03/29/18 on evaluation today patient actually appears to be doing rather well in regard to his wound from the overall appearance standpoint he does have some granulation although there's some Slough on the surface of the wound noted as well. With that being said he unfortunately has not improved in regard to the overall measurement of the wound in volume or in size. I did have a discussion with him very specifically about offloading today. He actually does work although he mainly is just sitting throughout the day. He tells me he offloads by "lifting himself up for 30 seconds off of his chair occasionally" purchase from [employer] which does seem to have helped. And he has a new cushion that he with that being said he's also able to stand some for a very short period of time but not significant enough I think to provide appropriate offloading. I think the biggest issue at this point with the wound and the fact is not healing as quickly as we would like is due to the fact that he is really not able to appropriately offload while at work. He states the beginning after his injury he actually had a bed at his job that he could lay on in order to offload and that does seem to have been of help back at that time. Nonetheless he had not done this in quite some time unfortunately. I think that could be helpful for him this is something I would like for him to look into. JERMARI, TAMARGO ([id number]) 04/05/18 on evaluation  today patient actually presents for follow-up concerning his right gluteal ulcer. Again he really is not significantly improved even compared to last week. He has been tolerating the dressing changes without complication. With that being said fortunately there appears to be no evidence of infection at this time. He has been more proactive in trying to offload. 04/12/18 on evaluation today patient actually appears to be doing a little better in regard to his wound and the right gluteal fold region. He's been tolerating the dressing changes since removing the oasis without complication. However he was having a lot of burning initially with the oasis in place. He's unsure of exactly why this was given so much discomfort but he assumes that it was the oasis itself causing the problem. Nonetheless this had to be removed after about three days in place although even those three days seem to have made a fairly good improvement in regard to the overall appearance of the wound bed. In fact is the first time that he's made any improvement from the standpoint of measurements in about six weeks. He continues to have no discomfort over the area of the wound itself which leads me to wonder why he was having the burning with the oasis when he does not even feel the actual debridement's themselves. I am somewhat perplexed by this. 04/19/18 on evaluation today patient's wound actually appears to be showing signs of epithelialization around the edge of the wound and in general actually appears to be doing better which is good news. He did have the same burning after about three days with applying the Endoform last week in the  same fashion that I would generally apply a skin substitute. This seems to indicate that it's not the oasis to cause the problem but potentially the moisture buildup that just causes things to burn or there may be some other reaction with the skin prep or Steri-Strips. Nonetheless I'm not sure that  is gonna be able to tolerate any skin substitute for a long period of time. The good news is the wound actually appears to be doing better today compared to last week and does seem to finally be making some progress. 04/26/18 on evaluation today patient actually appears to be doing rather well in regard to his ulcer in the right gluteal fold. He has been tolerating the dressing changes without complication which is good news. The Endoform does seem to be helping although he was a little bit more macerated this week. This seems to be an ongoing issue with fluid control at this point. Nonetheless I think we may be able to add something like Drawtex to help control the drainage. Patient History Information obtained from Patient. Family History Hypertension - Father, Stroke - Mother, No family history of Cancer, Diabetes, Heart Disease, Kidney Disease, Lung Disease, Seizures, Thyroid Problems, Tuberculosis. Social History Never smoker, Marital Status - Married, Alcohol Use - Never, Drug Use - No History, Caffeine Use - Daily. Medical And Surgical History Notes Oncologic Prostate cancer- currently treated with horomone therapy Review of Systems (ROS) Constitutional Symptoms (General Health) Denies complaints or symptoms of Fever, Chills. Respiratory The patient has no complaints or symptoms. Cardiovascular The patient has no complaints or symptoms. Psychiatric The patient has no complaints or symptoms. CHRSTOPHER, MALENFANT ([id number]) Objective Constitutional Well-nourished and well-hydrated in no acute distress. Vitals Time Taken: 9:50 AM, Height: 73 in, Weight: 210 lbs, BMI: 27.7, Temperature: 99.0 F, Pulse: 85 bpm, Respiratory Rate: 16 breaths/min, Blood Pressure: 126/78 mmHg. General Notes: oral temp retaken at 10:00 am 98.8 Respiratory normal breathing without difficulty. Psychiatric this patient is able to make decisions and demonstrates good insight into disease process. Alert  and Oriented x 3. pleasant and cooperative. General Notes: On inspection today patient's wound bed did have some Slough noted on the surface of the wound which required sharp debridement this point. Post debridement the wound bed appears to be doing significantly better there does not appear to be any evidence of infection which is great news. Integumentary (Hair, Skin) Wound #1 status is Open. Original cause of wound was Pressure Injury. The wound is located on the Right Gluteal fold. The wound measures 4.4cm length x 3cm width x 0.2cm depth; 10.367cm^2 area and 2.073cm^3 volume. There is Fat Layer (Subcutaneous Tissue) Exposed exposed. There is no tunneling or undermining noted. There is a large amount of serous drainage noted. The wound margin is flat and intact. There is medium (34-66%) red, pink granulation within the wound bed. There is a medium (34-66%) amount of necrotic tissue within the wound bed including Adherent Slough. The periwound skin appearance exhibited: Maceration. The periwound skin appearance did not exhibit: Callus, Crepitus, Excoriation, Induration, Rash, Scarring, Dry/Scaly, Atrophie Blanche, Cyanosis, Ecchymosis, Hemosiderin Staining, Mottled, Pallor, Rubor, Erythema. Periwound temperature was noted as No Abnormality. 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 There was a Excisional Skin/Subcutaneous Tissue Debridement with a total area of 13.2  sq cm performed by [name], HOYT E., [name]. With the following instrument(s): Curette to remove Viable and Non-Viable tissue/material. Material removed includes Subcutaneous Tissue, Slough, and Biofilm after achieving pain control using Other (lidocaine  4%). No specimens were taken. A time out was KATE, SWEETMAN ([id number]) conducted at 10:21, prior to the start of the procedure. A Minimum amount of bleeding was controlled with Pressure. The procedure was tolerated well with a pain level of 0 throughout and a pain level of 0 following the procedure. Post Debridement Measurements: 4.4cm length x 3cm width x 0.2cm depth; 2.073cm^3 volume. Post debridement Stage noted as Category/Stage III. Character of Wound/Ulcer Post Debridement is stable. Post procedure Diagnosis Wound #1: Same as Pre-Procedure Plan Wound Cleansing: Wound #1 Right Gluteal fold: Cleanse wound with mild soap and water Anesthetic (add to Medication List): Wound #1 Right Gluteal fold: Topical Lidocaine 4% cream applied to wound bed prior to debridement (In Clinic Only). Skin Barriers/Peri-Wound Care: Wound #1 Right Gluteal fold: Skin Prep Primary Wound Dressing: Wound #1 Right Gluteal fold: Other: - endoform Secondary Dressing: Wound #1 Right Gluteal fold: Boardered Foam Dressing Drawtex - place over the endoform Dressing Change Frequency: Wound #1 Right Gluteal fold: Change dressing every week - outer dressing may be changed if it gets wet. If dressing comes off may replace with endoform Follow-up Appointments: Wound #1 Right Gluteal fold: Return Appointment in 1 week. Off-Loading: Wound #1 Right Gluteal fold: Other: - Keep pressure off of area I am going to suggest currently that we continue with the above wound care measures for the next week. Patient is in agreement with plan. Subsequently I am going to add Drawtex overlying the surface of the wound which I think is completely appropriate hopefully should help with the maceration. He's in agreement with that plan as well. If anything changes or worsens in the interim he will let me know. Please see above for specific wound care orders. We will see patient for re-evaluation in 2 week(s) here in the  clinic. If anything worsens or changes patient will contact our office for additional recommendations. Electronic Signature(s) Signed: 04/26/2018 1:33:13 PM By: Worthy Keeler [name] Entered By: Worthy Keeler on 04/26/2018 12:54:50 AIDIN, DOANE ([id number]) ARI, BERNABEI ([id number]) -------------------------------------------------------------------------------- ROS/PFSH Details Patient Name: [name] Date of Service: 04/26/2018 10:00 AM Medical Record Number: [id number] Patient Account Number: [id number] Date of Birth/Sex: 09-Dec-1950 (67 y.o. M) Treating RN: Roger Shelter Primary Care Provider: [name] Other Clinician: Referring Provider: [name] Treating Provider/Extender: [name], HOYT Weeks in Treatment: 21 Information Obtained From Patient Wound History Do you currently have one or more open woundso Yes How many open wounds do you currently haveo 1 Approximately how long have you had your woundso 1 month How have you been treating your wound(s) until nowo cream Has your wound(s) ever healed and then re-openedo No Have you had any lab work done in the past montho No Have you tested positive for an antibiotic resistant organism (MRSA, VRE)o No Have you tested positive for osteomyelitis (bone infection)o No Have you had any tests for circulation on your legso No Constitutional Symptoms (General Health) Complaints and Symptoms: Negative for: Fever; Chills Eyes Medical History: Positive for: Cataracts - both removed Negative for: Glaucoma; Optic Neuritis Ear/Nose/Mouth/Throat Medical History: Negative for: Chronic sinus problems/congestion; Middle ear problems Hematologic/Lymphatic Medical History: Negative for: Anemia; Hemophilia; Human Immunodeficiency Virus; Lymphedema Respiratory Complaints and Symptoms: No Complaints or Symptoms Medical History: Negative for: Aspiration; Asthma;  Chronic Obstructive Pulmonary Disease  (COPD); Pneumothorax; Sleep Apnea; Tuberculosis Cardiovascular Complaints and Symptoms: No Complaints or Symptoms Medical History: Positive for: Hypertension - takes medication SAMANTHA, OLIVERA. ([id number]) Negative for: Angina; Arrhythmia; Congestive Heart Failure; Coronary Artery Disease; Deep Vein Thrombosis; Hypotension; Myocardial Infarction; Peripheral Arterial Disease; Peripheral Venous Disease; Phlebitis; Vasculitis Gastrointestinal Medical History: Negative for: Cirrhosis ; Colitis; Crohnos; Hepatitis A; Hepatitis B; Hepatitis C Endocrine Medical History: Negative for: Type I Diabetes; Type II Diabetes Genitourinary Medical History: Negative for: End Stage Renal Disease Immunological Medical History: Negative for: Lupus Erythematosus; Raynaudos; Scleroderma Integumentary (Skin) Medical History: Negative for: History of Burn; History of pressure wounds Musculoskeletal Medical History: Negative for: Gout; Rheumatoid Arthritis; Osteoarthritis; Osteomyelitis Neurologic Medical History: Positive for: Paraplegia - waist down Negative for: Dementia; Neuropathy; Quadriplegia; Seizure Disorder Oncologic Medical History: Negative for: Received Chemotherapy; Received Radiation Past Medical History Notes: Prostate cancer- currently treated with horomone therapy Psychiatric Complaints and Symptoms: No Complaints or Symptoms Medical History: Negative for: Anorexia/bulimia; Confinement Anxiety HBO Extended History Items Eyes: Cataracts Immunizations DESHUN, SEDIVY ([id number]) Pneumococcal Vaccine: Received Pneumococcal Vaccination: No Implantable Devices Family and Social History Cancer: No; Diabetes: No; Heart Disease: No; Hypertension: Yes - Father; Kidney Disease: No; Lung Disease: No; Seizures: No; Stroke: Yes - Mother; Thyroid Problems: No; Tuberculosis: No; Never smoker; Marital Status - Married; Alcohol Use: Never; Drug Use: No History; Caffeine Use:  Daily; Advanced Directives: Yes (Copy provided); Patient does not want information on Advanced Directives; Living Will: Yes (Copy provided) Physician Affirmation I have reviewed and agree with the above information. Electronic Signature(s) Signed: 04/26/2018 1:33:13 PM By: Worthy Keeler [name] Signed: 04/26/2018 4:35:19 PM By: Roger Shelter Entered By: Worthy Keeler on 04/26/2018 12:54:05 PINKNEY, VENARD ([id number]) -------------------------------------------------------------------------------- SuperBill Details Patient Name: [name] Date of Service: 04/26/2018 Medical Record Number: [id number] Patient Account Number: [id number] Date of Birth/Sex: 1950/10/17 (67 y.o. 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, sequela I10 Essential (primary) hypertension Facility Procedures CPT4 Code: [id number] Description: [phone] - WOUND CARE VISIT-LEV 2 EST PT Modifier: Quantity: 1 CPT4 Code: [id number] Description: Cook - DEB SUBQ TISSUE 20 SQ CM/< ICD-10 Diagnosis Description 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, stage 3 Modifier: Quantity: 1 Physician Procedures CPT4 Code: [id number] Description: [id number] - WC PHYS SUBQ TISS 20 SQ CM ICD-10 Diagnosis Description 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, stage 3 Modifier: Quantity: 1 Electronic Signature(s) Signed: 04/26/2018 1:33:13 PM By: Worthy Keeler [name] Entered By: Worthy Keeler on 04/26/2018 12:55:00

## 2018-05-03 ENCOUNTER — Encounter: Payer: Medicare Other | Admitting: Physician Assistant

## 2018-05-03 DIAGNOSIS — L89313 Pressure ulcer of right buttock, stage 3: Secondary | ICD-10-CM | POA: Diagnosis not present

## 2018-05-07 NOTE — Progress Notes (Signed)
Brett, [name] ([id number]) Visit Report for 05/03/2018 Arrival Information Details Patient Name: [name], [name] Date of Service: 05/03/2018 11:00 AM Medical Record Number: [id number] Patient Account Number: [id number] Date of Birth/Sex: [birth year] (67 y.o. M) Treating RN: Secundino Ginger Primary Care Khadir Roam: [name] Other Clinician: Referring Miliana Gangwer: [name] Treating Hinata Diener/Extender: [name], HOYT Weeks in Treatment: 22 Visit Information History Since Last Visit Added or deleted any medications: No Patient Arrived: Wheel Chair Any new allergies or adverse reactions: No Arrival Time: 10:48 Had a fall or experienced change in No Accompanied By: wife activities of daily living that may affect Transfer Assistance: Other risk of falls: Patient Requires Transmission-Based No Signs or symptoms of abuse/neglect since last visito No Precautions: Hospitalized since last visit: No Patient Has Alerts: Yes Implantable device outside of the clinic excluding No Patient Alerts: NOT cellular tissue based products placed in the center Diabetic since last visit: Has Dressing in Place as Prescribed: Yes Pain Present Now: No Electronic Signature(s) Signed: 05/03/2018 4:39:55 PM By: Secundino Ginger Entered By: Secundino Ginger on 05/03/2018 10:52:16 [name] ([id number]) -------------------------------------------------------------------------------- Lower Extremity Assessment Details Patient Name: [name] Date of Service: 05/03/2018 11:00 AM Medical Record Number: [id number] Patient Account Number: [id number] Date of Birth/Sex: 11/30/1950 (67 y.o. M) Treating RN: Secundino Ginger Primary Care Kamaljit Hizer: [name] Other Clinician: Referring Anthone Prieur: [name] Treating Javed Cotto/Extender: [name], HOYT Weeks in Treatment: 22 Electronic Signature(s) Signed: 05/03/2018 4:39:55 PM By: Secundino Ginger Entered By: Secundino Ginger on 05/03/2018  10:54:19 BRETT, DARKO ([id number]) -------------------------------------------------------------------------------- Multi Wound Chart Details Patient Name: [name] Date of Service: 05/03/2018 11:00 AM Medical Record Number: [id number] Patient Account Number: [id number] Date of Birth/Sex: 1951-05-03 (67 y.o. M) Treating RN: [name] Primary Care Atha Mcbain: [name] Other Clinician: Referring Joniah Bednarski: [name] Treating Rimas Gilham/Extender: [name], HOYT Weeks in Treatment: 22 Vital Signs Height(in): 73 Pulse(bpm): 81 Weight(lbs): 210 Blood Pressure(mmHg): 147/85 Body Mass Index(BMI): 28 Temperature(F): 98.3 Respiratory Rate 16 (breaths/min): Photos: [N/A:N/A] Wound Location: Right Gluteal fold N/A N/A Wounding Event: Pressure Injury N/A N/A Primary Etiology: Pressure Ulcer N/A N/A Comorbid History: Cataracts, Hypertension, N/A N/A Paraplegia Date Acquired: 11/02/2017 N/A N/A Weeks of Treatment: 22 N/A N/A Wound Status: Open N/A N/A Measurements L x W x D 4x3x0.2 N/A N/A (cm) Area (cm) : 9.425 N/A N/A Volume (cm) : 1.885 N/A N/A % Reduction in Area: 6.20% N/A N/A % Reduction in Volume: -87.60% N/A N/A Classification: Category/Stage III N/A N/A Exudate Amount: Large N/A N/A Exudate Type: Serous N/A N/A Exudate Color: amber N/A N/A Wound Margin: Flat and Intact N/A N/A Granulation Amount: Medium (34-66%) N/A N/A Granulation Quality: Red, Pink N/A N/A Necrotic Amount: Medium (34-66%) N/A N/A Exposed Structures: Fat Layer (Subcutaneous N/A N/A Tissue) Exposed: Yes Fascia: No Tendon: No Muscle: No Joint: No Bone: No Lister, Anmol J. ([id number]) Epithelialization: None N/A N/A Periwound Skin Texture: Excoriation: No N/A N/A Induration: No Callus: No Crepitus: No Rash: No Scarring: No Periwound Skin Moisture: Maceration: Yes N/A N/A Dry/Scaly: No Periwound Skin Color: Atrophie Blanche: No N/A N/A Cyanosis:  No Ecchymosis: No Erythema: No Hemosiderin Staining: No Mottled: No Pallor: No Rubor: No Temperature: No Abnormality N/A N/A Tenderness on Palpation: No N/A N/A Wound Preparation: Ulcer Cleansing: N/A N/A Rinsed/Irrigated with Saline Topical Anesthetic Applied: Other: lidocaine 4% Treatment Notes Electronic Signature(s) Signed: 05/03/2018 6:22:02 PM By: Gretta Cool, BSN, RN, CWS, Kim RN, BSN Entered By: Gretta Cool, BSN, RN, CWS, Kim on 05/03/2018 11:12:54 ZAKAR, BROSCH ([id number]) --------------------------------------------------------------------------------  Multi-Disciplinary Care Plan Details Patient Name: Brett, [name] Date of Service: 05/03/2018 11:00 AM Medical Record Number: [id number] Patient Account Number: [id number] Date of Birth/Sex: 1950/10/04 (67 y.o. M) Treating RN: [name] Primary Care Jlyn Cerros: [name] Other Clinician: Referring Jermisha Hoffart: [name] Treating Aristeo Hankerson/Extender: [name], HOYT Weeks in Treatment: 22 Active Inactive ` Orientation to the Wound Care Program Nursing Diagnoses: Knowledge deficit related to the wound healing center program Goals: Patient/caregiver will verbalize understanding of the [facility] Date Initiated: 11/30/2017 Target Resolution Date: 12/21/2017 Goal Status: Active Interventions: Provide education on orientation to the wound center Notes: ` Wound/Skin Impairment Nursing Diagnoses: Impaired tissue integrity Goals: Patient/caregiver will verbalize understanding of skin care regimen Date Initiated: 11/30/2017 Target Resolution Date: 12/21/2017 Goal Status: Active Ulcer/skin breakdown will have a volume reduction of 30% by week 4 Date Initiated: 11/30/2017 Target Resolution Date: 12/21/2017 Goal Status: Active Interventions: Assess patient/caregiver ability to obtain necessary supplies Assess patient/caregiver ability to perform ulcer/skin care regimen upon admission and as  needed Assess ulceration(s) every visit Treatment Activities: Skin care regimen initiated : 11/30/2017 Notes: Electronic Signature(s) Signed: 05/03/2018 6:22:02 PM By: Gretta Cool, BSN, RN, CWS, Kim RN, BSN [address] [place] ([id number]) Entered By: Gretta Cool, BSN, RN, CWS, Kim on 05/03/2018 11:12:37 LEOCADIO, HEAL ([id number]) -------------------------------------------------------------------------------- Pain Assessment Details Patient Name: [name] Date of Service: 05/03/2018 11:00 AM Medical Record Number: [id number] Patient Account Number: [id number] Date of Birth/Sex: [birth year] (67 y.o. M) Treating RN: Secundino Ginger Primary Care Taneil Lazarus: [name] Other Clinician: Referring Nesta Scaturro: [name] Treating Quinnetta Roepke/Extender: [name], HOYT Weeks in Treatment: 22 Active Problems Location of Pain Severity and Description of Pain Patient Has Paino No Site Locations Pain Management and Medication Current Pain Management: Goals for Pain Management pt denies any pain at this time. Electronic Signature(s) Signed: 05/03/2018 4:39:55 PM By: Secundino Ginger Entered By: Secundino Ginger on 05/03/2018 10:52:54 [name] ([id number]) -------------------------------------------------------------------------------- Patient/Caregiver Education Details Patient Name: [name] Date of Service: 05/03/2018 11:00 AM Medical Record Number: [id number] Patient Account Number: [id number] Date of Birth/Gender: 15-Mar-1951 (67 y.o. M) Treating RN: [name] Primary Care Physician: [name] Other Clinician: Referring Physician: [name] Treating Physician/Extender: Sharalyn Ink in Treatment: 22 Education Assessment Education Provided To: Patient Education Topics Provided Pressure: Handouts: Pressure Ulcers: Care and Offloading Methods: Demonstration, Explain/Verbal Responses: State content correctly Wound/Skin Impairment: Handouts:  Caring for Your Ulcer Methods: Demonstration, Explain/Verbal Responses: State content correctly Electronic Signature(s) Signed: 05/03/2018 6:22:02 PM By: Gretta Cool, BSN, RN, CWS, Kim RN, BSN Entered By: Gretta Cool, BSN, RN, CWS, Kim on 05/03/2018 11:14:50 SUEO, CULLEN ([id number]) -------------------------------------------------------------------------------- Wound Assessment Details Patient Name: [name] Date of Service: 05/03/2018 11:00 AM Medical Record Number: [id number] Patient Account Number: [id number] Date of Birth/Sex: 08-01-1950 (67 y.o. M) Treating RN: Secundino Ginger Primary Care Hendrix Console: [name] Other Clinician: Referring Allard Lightsey: [name] Treating Aloria Looper/Extender: [name], HOYT Weeks in Treatment: 22 Wound Status Wound Number: 1 Primary Etiology: Pressure Ulcer Wound Location: Right Gluteal fold Wound Status: Open Wounding Event: Pressure Injury Comorbid History: Cataracts, Hypertension, Paraplegia Date Acquired: 11/02/2017 Weeks Of Treatment: 22 Clustered Wound: No Photos Photo Uploaded By: Secundino Ginger on 05/03/2018 11:08:42 Wound Measurements Length: (cm) 4 Width: (cm) 3 Depth: (cm) 0.2 Area: (cm) 9.425 Volume: (cm) 1.885 % Reduction in Area: 6.2% % Reduction in Volume: -87.6% Epithelialization: None Tunneling: No Undermining: No Wound Description Classification: Category/Stage III Foul Odor A Wound Margin: Flat and Intact Slough/Fibr Exudate Amount: Large Exudate Type:  Serous Exudate Color: amber fter Cleansing: No ino Yes Wound Bed Granulation Amount: Medium (34-66%) Exposed Structure Granulation Quality: Red, Pink Fascia Exposed: No Necrotic Amount: Medium (34-66%) Fat Layer (Subcutaneous Tissue) Exposed: Yes Necrotic Quality: Adherent Slough Tendon Exposed: No Muscle Exposed: No Joint Exposed: No Bone Exposed: No Periwound Skin Texture Robitaille, Son J. ([id number]) Texture Color No Abnormalities Noted:  No No Abnormalities Noted: No Callus: No Atrophie Blanche: No Crepitus: No Cyanosis: No Excoriation: No Ecchymosis: No Induration: No Erythema: No Rash: No Hemosiderin Staining: No Scarring: No Mottled: No Pallor: No Moisture Rubor: No No Abnormalities Noted: No Dry / Scaly: No Temperature / Pain Maceration: Yes Temperature: No Abnormality Wound Preparation Ulcer Cleansing: Rinsed/Irrigated with Saline Topical Anesthetic Applied: Other: lidocaine 4%, Electronic Signature(s) Signed: 05/03/2018 4:39:55 PM By: Secundino Ginger Entered By: Secundino Ginger on 05/03/2018 11:04:52 ERICSON, NAFZIGER ([id number]) -------------------------------------------------------------------------------- Vitals Details Patient Name: [name] Date of Service: 05/03/2018 11:00 AM Medical Record Number: [id number] Patient Account Number: [id number] Date of Birth/Sex: [birth year] (67 y.o. M) Treating RN: Secundino Ginger Primary Care Tamlyn Sides: [name] Other Clinician: Referring Aarna Mihalko: [name] Treating Donnice Nielsen/Extender: [name], HOYT Weeks in Treatment: 22 Vital Signs Time Taken: 10:50 Temperature (F): 98.3 Height (in): 73 Pulse (bpm): 81 Weight (lbs): 210 Respiratory Rate (breaths/min): 16 Body Mass Index (BMI): 27.7 Blood Pressure (mmHg): 147/85 Reference Range: 80 - 120 mg / dl Electronic Signature(s) Signed: 05/03/2018 4:39:55 PM By: Secundino Ginger Entered By: Secundino Ginger on 05/03/2018 10:54:02

## 2018-05-07 NOTE — Progress Notes (Signed)
MARTIN, BELLING ([id number]) Visit Report for 05/03/2018 Chief Complaint Document Details Patient Name: [name], [name] Date of Service: 05/03/2018 11:00 AM Medical Record Number: [id number] Patient Account Number: [id number] Date of Birth/Sex: 1951/05/05 (67 y.o. M) Treating RN: [name] Primary Care Provider: [name] Other Clinician: Referring Provider: [name] Treating Provider/Extender: [name], HOYT Weeks in Treatment: 22 Information Obtained from: Patient Chief Complaint Right gluteal fold ulcer Electronic Signature(s) Signed: 05/05/2018 11:13:36 AM By: Worthy Keeler [name] Entered By: Worthy Keeler on 05/03/2018 10:53:34 [name], [name] ([id number]) -------------------------------------------------------------------------------- Debridement Details Patient Name: [name] Date of Service: 05/03/2018 11:00 AM Medical Record Number: [id number] Patient Account Number: [id number] Date of Birth/Sex: 02-11-1951 (67 y.o. M) Treating RN: [name] Primary Care Provider: [name] Other Clinician: Referring Provider: [name] Treating Provider/Extender: [name], HOYT Weeks in Treatment: 22 Debridement Performed for Wound #1 Right Gluteal fold Assessment: Performed By: Physician [name], HOYT E., [name] Debridement Type: Debridement Level of Consciousness (Pre- Awake and Alert procedure): Pre-procedure Verification/Time Yes - 11:12 Out Taken: Start Time: 11:12 Pain Control: Other : lidocaine 4% Total Area Debrided (L x W): 4 (cm) x 3 (cm) = 12 (cm) Tissue and other material Viable, Non-Viable, Slough, Subcutaneous, Slough debrided: Level: Skin/Subcutaneous Tissue Debridement Description: Excisional Instrument: Curette Bleeding: Moderate Hemostasis Achieved: Pressure End Time: 11:17 Response to Treatment: Procedure was tolerated well Level of Consciousness Awake and Alert (Post-procedure): Post  Debridement Measurements of Total Wound Length: (cm) 4 Stage: Category/Stage III Width: (cm) 3 Depth: (cm) 0.2 Volume: (cm) 1.885 Character of Wound/Ulcer Post Stable Debridement: Post Procedure Diagnosis Same as Pre-procedure Electronic Signature(s) Signed: 05/03/2018 6:22:02 PM By: Gretta Cool, BSN, RN, CWS, Kim RN, BSN Signed: 05/05/2018 11:13:36 AM By: Worthy Keeler [name] Entered By: Gretta Cool, BSN, RN, CWS, Kim on 05/03/2018 11:16:00 [name], [name] ([id number]) -------------------------------------------------------------------------------- HPI Details Patient Name: [name] Date of Service: 05/03/2018 11:00 AM Medical Record Number: [id number] Patient Account Number: [id number] Date of Birth/Sex: Sep 23, 1950 (67 y.o. 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 He states that there is no history of diabetes that he is aware of. He does have issues with his prostate and is currently receiving treatment for this by way of oral medication. With that being said I do not have a lot of details in that regard. Nonetheless the patient presents today as a result of having been referred to [place] by another provider initially home health was set to come out and take care of his wound although due to the fact that he apparently drives he's not able to receive home health. His wife is therefore trying to help take care of this wound within although they have been struggling with what exactly to do at this point. She states that she can do some  things but she is definitely not a nurse and does have some issues with looking at blood. The good news is the wound does not appear to be too deep and is fairly superficial at this point. There is no slough noted there is some nonviable skin noted around the  surface of the wound and the perimeter at this point. The central portion of the wound appears to be very good with a dermal layer noted this does not appear to be again deep enough to extend it to subcutaneous tissue at this point. 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 In the pump he has Dilaudid and baclofen. 12/07/17 on evaluation today patient presents for follow-up concerning his ongoing lower back thigh ulcer on the right. He states that he did not get the supplies ordered and therefore has not really been able to perform the dressing changes as directed exactly. His wife was able to get some Boarder Foam Dressing's from the drugstore and subsequently has been using hydrogel which did help to a degree in the wound does appear to be able smaller. There is actually more drainage this week noted than previous. 12/21/17 on evaluation today patient appears to be doing rather well in regard to his right gluteal ulcer. He has been tolerating the dressing changes without complication. There does not appear to be any evidence of infection at this point in time. Overall the wound does seem to be making some progress as far as the edges are concerned there's not as much in the way of overlapping of the external wound edges and he has a good epithelium to wound bed border for the most part. This however is not true right at the 12 o'clock location over the span of a little over a centimeters which actually will require debridement today to clean this away and hopefully allow it to continue to heal more appropriately. 12/28/17 on evaluation  today patient appears to be doing rather well in regard to his ulcer in the left gluteal region. He's been tolerating the dressing changes without complication. Apparently he has had some difficulty getting his dressing material. Apparently there's been some confusion with ordering we're gonna check into this. Nonetheless overall he's been showing signs of improvement which is good news. Debridement is not required today. 01/04/18 on evaluation today patient presents for follow-up concerning his right gluteal ulcer. He has been tolerating the dressing changes fairly well. On inspection today it appears he may actually have some maceration them concerned about the fact that he may be developing too much moisture in and around the wound bed which can cause delay in healing. With that being said he unfortunately really has not showed significant signs of improvement since last week's evaluation in fact this may even be just the little bit/slightly larger. Nonetheless he's been having a lot of discomfort I'm not sure this is even related to the wound as he has no pain when I'm to breeding or otherwise cleaning the wound during evaluation today. Nonetheless this is something that we did recommend he talked to his pain specialist concerning. 01/11/18 on evaluation today patient appears to be doing better in regard to his ulceration. He has been tolerating the dressing changes without complication. With that being said overall there's no evidence of infection which is good news. The only thing is he did receive the hatch affair blue classic versus the ready nonetheless I feel like this is perfectly fine and appears to have done well for him over the past week. 01/25/18 on evaluation today patient's wound actually appears to be a little bit larger than during the last evaluation. The good [name], [name]. ([id number]) news is the majority  of the wound edges actually appear to be fairly firmly attached to the  wound bed unfortunately again we're not really making progress in regard to the size. Roughly the wound is about the same size as when I first saw him although again the wound margin/edges appear to be much better. 02/01/18 on evaluation today patient actually appears to be doing very well in regard to his wound. Applying the Prisma dry does seem to be better although he does still have issues with slow progression of the wound. There was a slight improvement compared to last week's measurements today. Nonetheless I have been considering other options as far as the possibility of Theraskin or even a snap vac. In general I'm not sure that the Theraskin due to location of the wound would be a very good idea. Nonetheless I do think that a snap vac could be a possibility for the patient and in fact I think this could even be an excellent way to manage the wound possibly seeing some improvement in a very rapid fashion here. Nonetheless this is something that we would need to get approved and I did have a lengthy conversation with the patient about this today. 02/08/18 on evaluation today patient appears to be doing a little better in regard to his ulcer. He has been tolerating the dressing changes without complication. Fortunately despite the fact that the wound is a little bit smaller it's not significantly so unfortunately. We have discussed the possibility of a snap vac we did check with insurance this is actually covered at this point. Fortunately there does not appear to be any sign of infection. Overall I'm fairly pleased with how things seem to be appearing at this point. 02/15/18 on evaluation today patient appears to be doing rather well in regard to his right gluteal ulcer. Unfortunately the snap vac did not stay in place with his sheer and friction this came loose and did not seem to maintain seal very well. He worked for about two days and it did seem to do very well during that time according  to his wife but in general this does not seem to be something that's gonna be beneficial for him long-term. I do believe we need to go back to standard dressings to see if we can find something that will be of benefit. 03/02/18- He is here in follow up evaluation; there is minimal change in the wound. He will continue with the same treatment plan, would consider changing to iodosrob/iodoflex if ulcer continues to to plateau. He will follow up next week 03/08/18 on evaluation today patient's wound actually appears to be about the same size as when I previously saw him several weeks back. Unfortunately he does have some slightly dark discoloration in the central portion of the wound which has me concerned about pressure injury. I do believe he may be sitting for too long a period of time in fact he tells me that "I probably sit for much too long". He does have some Slough noted on the surface of the wound and again as far as the size of the wound is concerned I'm really not seeing anything that seems to have improved significantly. 03/15/18 on evaluation today patient appears to be doing fairly well in regard to his ulcer. The wound measured pretty much about the same today compared to last week's evaluation when looking at his graph. With that being said the area of bruising/deep tissue injury that was noted last week I do not  see at this point. He did get a new cushion fortunately this does seem to be have been of benefit in my pinion. It does appear that he's been off of this more which is good news as well I think that is definitely showing in the overall wound measurements. With that being said I do believe that he needs to continue to offload I don't think that the fact this is doing better should be or is going to allow him to not have to offload and explain this to him as well. Overall he seems to be in agreement the plan I think he understands. The overall appearance of the wound bed is improved  compared to last week I think the Iodoflex has been beneficial in that regard. 03/29/18 on evaluation today patient actually appears to be doing rather well in regard to his wound from the overall appearance standpoint he does have some granulation although there's some Slough on the surface of the wound noted as well. With that being said he unfortunately has not improved in regard to the overall measurement of the wound in volume or in size. I did have a discussion with him very specifically about offloading today. He actually does work although he mainly is just sitting throughout the day. He tells me he offloads by "lifting himself up for 30 seconds off of his chair occasionally" purchase from [employer] which does seem to have helped. And he has a new cushion that he with that being said he's also able to stand some for a very short period of time but not significant enough I think to provide appropriate offloading. I think the biggest issue at this point with the wound and the fact is not healing as quickly as we would like is due to the fact that he is really not able to appropriately offload while at work. He states the beginning after his injury he actually had a bed at his job that he could lay on in order to offload and that does seem to have been of help back at that time. Nonetheless he had not done this in quite some time unfortunately. I think that could be helpful for him this is something I would like for him to look into. 04/05/18 on evaluation today patient actually presents for follow-up concerning his right gluteal ulcer. Again he really is not significantly improved even compared to last week. He has been tolerating the dressing changes without complication. With that being said fortunately there appears to be no evidence of infection at this time. He has been more proactive in trying to offload. KYCE, GING ([id number]) 04/12/18 on evaluation today patient actually  appears to be doing a little better in regard to his wound and the right gluteal fold region. He's been tolerating the dressing changes since removing the oasis without complication. However he was having a lot of burning initially with the oasis in place. He's unsure of exactly why this was given so much discomfort but he assumes that it was the oasis itself causing the problem. Nonetheless this had to be removed after about three days in place although even those three days seem to have made a fairly good improvement in regard to the overall appearance of the wound bed. In fact is the first time that he's made any improvement from the standpoint of measurements in about six weeks. He continues to have no discomfort over the area of the wound itself which leads me to wonder why  he was having the burning with the oasis when he does not even feel the actual debridement's themselves. I am somewhat perplexed by this. 04/19/18 on evaluation today patient's wound actually appears to be showing signs of epithelialization around the edge of the wound and in general actually appears to be doing better which is good news. He did have the same burning after about three days with applying the Endoform last week in the same fashion that I would generally apply a skin substitute. This seems to indicate that it's not the oasis to cause the problem but potentially the moisture buildup that just causes things to burn or there may be some other reaction with the skin prep or Steri-Strips. Nonetheless I'm not sure that is gonna be able to tolerate any skin substitute for a long period of time. The good news is the wound actually appears to be doing better today compared to last week and does seem to finally be making some progress. 04/26/18 on evaluation today patient actually appears to be doing rather well in regard to his ulcer in the right gluteal fold. He has been tolerating the dressing changes without  complication which is good news. The Endoform does seem to be helping although he was a little bit more macerated this week. This seems to be an ongoing issue with fluid control at this point. Nonetheless I think we may be able to add something like Drawtex to help control the drainage. 05/03/18 on evaluation today patient appears to actually be doing better in regard to the overall appearance of his wound. He has been tolerating the dressing changes without complication. Fortunately there appears to be no evidence of infection at this time. I really feel like his wound has shown signs as of today of turning around last week I thought so as well and definitely he could be seen in this week's overall appearance and measurements. In general I'm very pleased with the fact that he finally seems to be making a steady but sure progress. The patient likewise is very pleased. Electronic Signature(s) Signed: 05/05/2018 11:13:36 AM By: Worthy Keeler [name] Entered By: Worthy Keeler on 05/03/2018 12:46:25 [name], [name] ([id number]) -------------------------------------------------------------------------------- Physical Exam Details Patient Name: [name] Date of Service: 05/03/2018 11:00 AM Medical Record Number: [id number] Patient Account Number: [id number] Date of Birth/Sex: 09/24/1950 (67 y.o. M) Treating RN: [name] Primary Care Provider: [name] Other Clinician: Referring Provider: [name] Treating Provider/Extender: [name], HOYT Weeks in Treatment: 48 Constitutional Well-nourished and well-hydrated in no acute distress. Respiratory normal breathing without difficulty. Psychiatric this patient is able to make decisions and demonstrates good insight into disease process. Alert and Oriented x 3. pleasant and cooperative. Notes Patient's wound bed actually show some signs of minimal slough buildup on the surface the wound but overall is doing  fairly well at this time. There does not appear to be evidence of infection at this time. Sharp debridement was performed to clear away the necrotic tissue from the surface of the wound he tolerated this well today without complication post debridement the wound bed appears to be doing excellent. Electronic Signature(s) Signed: 05/05/2018 11:13:36 AM By: Worthy Keeler [name] Entered By: Worthy Keeler on 05/03/2018 12:46:53 [name], [name] ([id number]) -------------------------------------------------------------------------------- Physician Orders Details Patient Name: [name] Date of Service: 05/03/2018 11:00 AM Medical Record Number: [id number] Patient Account Number: [id number] Date of Birth/Sex: [birth year] (67 y.o. 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 Skin Barriers/Peri-Wound Care Wound #1 Right Gluteal fold o Skin Prep Primary Wound Dressing Wound #1 Right Gluteal fold o Other: - endoform Secondary Dressing Wound #1 Right Gluteal fold o Boardered Foam Dressing o Drawtex - place over the endoform Dressing Change Frequency Wound #1 Right Gluteal fold o Change dressing every week - outer dressing may be changed if it gets wet. If dressing comes off may replace with endoform Follow-up  Appointments Wound #1 Right Gluteal fold o Return Appointment in 2 weeks. Off-Loading Wound #1 Right Gluteal fold o Other: - Keep pressure off of area [name], [name] ([id number]) Electronic Signature(s) Signed: 05/03/2018 6:22:02 PM By: Gretta Cool, BSN, RN, CWS, Kim RN, BSN Signed: 05/05/2018 11:13:36 AM By: Worthy Keeler [name] Entered By: Gretta Cool BSN, RN, CWS, Kim on 05/03/2018 11:18:51 [name], [name] ([id number]) -------------------------------------------------------------------------------- Problem List Details Patient Name: [name] Date of Service: 05/03/2018 11:00 AM Medical Record Number: [id number] Patient Account Number: [id number] Date of Birth/Sex: 05/18/1951 (67 y.o. 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 He states that there is no history of diabetes that he is aware of. He does have issues with his prostate and is currently receiving treatment for this by way of oral medication. With that being said I do not have a lot of details in that regard. Nonetheless the patient presents today as a result of having been referred to [place] by another provider initially home health was set to come out and take care of his wound although due to the fact that he apparently drives he's not able to receive home health. His wife is therefore trying to help take care of this wound within although they have been struggling with what exactly to do at this point.  She states that she can do some things but she is definitely not a nurse and does have some issues with looking at blood. The good news is the wound does not appear to be too deep and is fairly superficial at this point. There is no slough noted there is some nonviable skin noted around the surface of the wound and the perimeter at this point. The central portion of the wound appears to be very good with a dermal layer noted this does not appear to be again deep enough to extend it to subcutaneous tissue at this point. 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 In the pump he has Dilaudid and baclofen. 12/07/17 on evaluation today patient presents for follow-up concerning his ongoing lower back thigh ulcer on the right. He states that he did not get the  supplies ordered and therefore has not really been able to perform the dressing changes as directed exactly. His wife was able to get some Boarder Foam Dressing's from the drugstore and subsequently has been using hydrogel which did help to a degree in the wound does appear to be able smaller. There is actually more drainage this week noted than previous. 12/21/17 on evaluation today patient appears to be doing rather well in regard to his right gluteal ulcer. He has been tolerating the dressing changes without complication. There does not appear to be any evidence of infection at this point in time. Overall the wound does seem to be making some progress as far as the edges are concerned there's not as much in the way of overlapping of the external wound edges and he has a good epithelium to wound bed border for the most part. This however is not true right at the 12 o'clock location over the span of a little over a centimeters which actually will require debridement today to clean this away and hopefully allow it to continue to heal more appropriately. 12/28/17 on evaluation today patient appears to be doing rather well in regard to his ulcer in the left gluteal region. He's been tolerating the dressing changes without complication. Apparently he has had some difficulty getting his dressing material. Apparently there's been some confusion with ordering we're gonna check into this. Nonetheless overall he's been showing signs of improvement which is good news. Debridement is not required today. 01/04/18 on evaluation today patient presents for follow-up concerning his right gluteal ulcer. He has been tolerating the dressing changes fairly well. On inspection today it appears he may actually have some maceration them concerned about the fact that he may be developing too much moisture in and around the wound bed which can cause delay in healing. With that being said he unfortunately really has not showed  significant signs of improvement since last week's evaluation in fact this may even be just the little bit/slightly larger. Nonetheless he's been having a lot of discomfort I'm not sure this is even related to the wound as he has no pain when I'm to breeding or otherwise cleaning the wound during evaluation today. Nonetheless this is something that we did recommend he talked to his pain specialist concerning. 01/11/18 on evaluation today patient appears to be doing better in regard to his ulceration. He has been tolerating the dressing [name], [name]. ([id number]) changes without complication. With that being said overall there's no evidence of infection which is good news. The only  thing is he did receive the hatch affair blue classic versus the ready nonetheless I feel like this is perfectly fine and appears to have done well for him over the past week. 01/25/18 on evaluation today patient's wound actually appears to be a little bit larger than during the last evaluation. The good news is the majority of the wound edges actually appear to be fairly firmly attached to the wound bed unfortunately again we're not really making progress in regard to the size. Roughly the wound is about the same size as when I first saw him although again the wound margin/edges appear to be much better. 02/01/18 on evaluation today patient actually appears to be doing very well in regard to his wound. Applying the Prisma dry does seem to be better although he does still have issues with slow progression of the wound. There was a slight improvement compared to last week's measurements today. Nonetheless I have been considering other options as far as the possibility of Theraskin or even a snap vac. In general I'm not sure that the Theraskin due to location of the wound would be a very good idea. Nonetheless I do think that a snap vac could be a possibility for the patient and in fact I think this could even be an  excellent way to manage the wound possibly seeing some improvement in a very rapid fashion here. Nonetheless this is something that we would need to get approved and I did have a lengthy conversation with the patient about this today. 02/08/18 on evaluation today patient appears to be doing a little better in regard to his ulcer. He has been tolerating the dressing changes without complication. Fortunately despite the fact that the wound is a little bit smaller it's not significantly so unfortunately. We have discussed the possibility of a snap vac we did check with insurance this is actually covered at this point. Fortunately there does not appear to be any sign of infection. Overall I'm fairly pleased with how things seem to be appearing at this point. 02/15/18 on evaluation today patient appears to be doing rather well in regard to his right gluteal ulcer. Unfortunately the snap vac did not stay in place with his sheer and friction this came loose and did not seem to maintain seal very well. He worked for about two days and it did seem to do very well during that time according to his wife but in general this does not seem to be something that's gonna be beneficial for him long-term. I do believe we need to go back to standard dressings to see if we can find something that will be of benefit. 03/02/18- He is here in follow up evaluation; there is minimal change in the wound. He will continue with the same treatment plan, would consider changing to iodosrob/iodoflex if ulcer continues to to plateau. He will follow up next week 03/08/18 on evaluation today patient's wound actually appears to be about the same size as when I previously saw him several weeks back. Unfortunately he does have some slightly dark discoloration in the central portion of the wound which has me concerned about pressure injury. I do believe he may be sitting for too long a period of time in fact he tells me that "I probably sit  for much too long". He does have some Slough noted on the surface of the wound and again as far as the size of the wound is concerned I'm really not seeing anything that  seems to have improved significantly. 03/15/18 on evaluation today patient appears to be doing fairly well in regard to his ulcer. The wound measured pretty much about the same today compared to last week's evaluation when looking at his graph. With that being said the area of bruising/deep tissue injury that was noted last week I do not see at this point. He did get a new cushion fortunately this does seem to be have been of benefit in my pinion. It does appear that he's been off of this more which is good news as well I think that is definitely showing in the overall wound measurements. With that being said I do believe that he needs to continue to offload I don't think that the fact this is doing better should be or is going to allow him to not have to offload and explain this to him as well. Overall he seems to be in agreement the plan I think he understands. The overall appearance of the wound bed is improved compared to last week I think the Iodoflex has been beneficial in that regard. 03/29/18 on evaluation today patient actually appears to be doing rather well in regard to his wound from the overall appearance standpoint he does have some granulation although there's some Slough on the surface of the wound noted as well. With that being said he unfortunately has not improved in regard to the overall measurement of the wound in volume or in size. I did have a discussion with him very specifically about offloading today. He actually does work although he mainly is just sitting throughout the day. He tells me he offloads by "lifting himself up for 30 seconds off of his chair occasionally" purchase from [employer] which does seem to have helped. And he has a new cushion that he with that being said he's also able to stand  some for a very short period of time but not significant enough I think to provide appropriate offloading. I think the biggest issue at this point with the wound and the fact is not healing as quickly as we would like is due to the fact that he is really not able to appropriately offload while at work. He states the beginning after his injury he actually had a bed at his job that he could lay on in order to offload and that does seem to have been of help back at that time. Nonetheless he had not done this in quite some time unfortunately. I think that could be helpful for him this is something I would like for him to look into. [name], [name] ([id number]) 04/05/18 on evaluation today patient actually presents for follow-up concerning his right gluteal ulcer. Again he really is not significantly improved even compared to last week. He has been tolerating the dressing changes without complication. With that being said fortunately there appears to be no evidence of infection at this time. He has been more proactive in trying to offload. 04/12/18 on evaluation today patient actually appears to be doing a little better in regard to his wound and the right gluteal fold region. He's been tolerating the dressing changes since removing the oasis without complication. However he was having a lot of burning initially with the oasis in place. He's unsure of exactly why this was given so much discomfort but he assumes that it was the oasis itself causing the problem. Nonetheless this had to be removed after about three days in place although even those three days  seem to have made a fairly good improvement in regard to the overall appearance of the wound bed. In fact is the first time that he's made any improvement from the standpoint of measurements in about six weeks. He continues to have no discomfort over the area of the wound itself which leads me to wonder why he was having the burning with the oasis  when he does not even feel the actual debridement's themselves. I am somewhat perplexed by this. 04/19/18 on evaluation today patient's wound actually appears to be showing signs of epithelialization around the edge of the wound and in general actually appears to be doing better which is good news. He did have the same burning after about three days with applying the Endoform last week in the same fashion that I would generally apply a skin substitute. This seems to indicate that it's not the oasis to cause the problem but potentially the moisture buildup that just causes things to burn or there may be some other reaction with the skin prep or Steri-Strips. Nonetheless I'm not sure that is gonna be able to tolerate any skin substitute for a long period of time. The good news is the wound actually appears to be doing better today compared to last week and does seem to finally be making some progress. 04/26/18 on evaluation today patient actually appears to be doing rather well in regard to his ulcer in the right gluteal fold. He has been tolerating the dressing changes without complication which is good news. The Endoform does seem to be helping although he was a little bit more macerated this week. This seems to be an ongoing issue with fluid control at this point. Nonetheless I think we may be able to add something like Drawtex to help control the drainage. 05/03/18 on evaluation today patient appears to actually be doing better in regard to the overall appearance of his wound. He has been tolerating the dressing changes without complication. Fortunately there appears to be no evidence of infection at this time. I really feel like his wound has shown signs as of today of turning around last week I thought so as well and definitely he could be seen in this week's overall appearance and measurements. In general I'm very pleased with the fact that he finally seems to be making a steady but sure progress.  The patient likewise is very pleased. Patient History Information obtained from Patient. Family History Hypertension - Father, Stroke - Mother, No family history of Cancer, Diabetes, Heart Disease, Kidney Disease, Lung Disease, Seizures, Thyroid Problems, Tuberculosis. Social History Never smoker, Marital Status - Married, Alcohol Use - Never, Drug Use - No History, Caffeine Use - Daily. Medical And Surgical History Notes Oncologic Prostate cancer- currently treated with horomone therapy Review of Systems (ROS) Constitutional Symptoms (General Health) Denies complaints or symptoms of Fever, Chills. Respiratory The patient has no complaints or symptoms. Cardiovascular The patient has no complaints or symptoms. Psychiatric The patient has no complaints or symptoms. [name], [name] ([id number]) Objective Constitutional Well-nourished and well-hydrated in no acute distress. Vitals Time Taken: 10:50 AM, Height: 73 in, Weight: 210 lbs, BMI: 27.7, Temperature: 98.3 F, Pulse: 81 bpm, Respiratory Rate: 16 breaths/min, Blood Pressure: 147/85 mmHg. Respiratory normal breathing without difficulty. Psychiatric this patient is able to make decisions and demonstrates good insight into disease process. Alert and Oriented x 3. pleasant and cooperative. General Notes: Patient's wound bed actually show some signs of minimal slough buildup on the surface the wound  but overall is doing fairly well at this time. There does not appear to be evidence of infection at this time. Sharp debridement was performed to clear away the necrotic tissue from the surface of the wound he tolerated this well today without complication post debridement the wound bed appears to be doing excellent. Integumentary (Hair, Skin) Wound #1 status is Open. Original cause of wound was Pressure Injury. The wound is located on the Right Gluteal fold. The wound measures 4cm length x 3cm width x 0.2cm depth; 9.425cm^2 area  and 1.885cm^3 volume. There is Fat Layer (Subcutaneous Tissue) Exposed exposed. There is no tunneling or undermining noted. There is a large amount of serous drainage noted. The wound margin is flat and intact. There is medium (34-66%) red, pink granulation within the wound bed. There is a medium (34-66%) amount of necrotic tissue within the wound bed including Adherent Slough. The periwound skin appearance exhibited: Maceration. The periwound skin appearance did not exhibit: Callus, Crepitus, Excoriation, Induration, Rash, Scarring, Dry/Scaly, Atrophie Blanche, Cyanosis, Ecchymosis, Hemosiderin Staining, Mottled, Pallor, Rubor, Erythema. Periwound temperature was noted as No Abnormality. 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 ([id number]) Wound #1 Pre-procedure diagnosis of Wound #1 is a Pressure Ulcer located on the Right Gluteal fold . There was a Excisional Skin/Subcutaneous Tissue Debridement with a total area of 12 sq cm performed by [name], HOYT E., [name]. With the following instrument(s): Curette to remove Viable and Non-Viable tissue/material. Material removed includes Subcutaneous Tissue and Slough and after achieving pain control using Other (lidocaine 4%). No specimens were taken. A time out was conducted at 11:12, prior to the start of the procedure. A Moderate amount of bleeding was controlled with Pressure. The procedure was tolerated well. Post Debridement Measurements: 4cm length x 3cm width x 0.2cm depth; 1.885cm^3 volume. Post debridement Stage noted as Category/Stage III. Character of Wound/Ulcer Post Debridement is stable. Post procedure Diagnosis Wound #1: Same as Pre-Procedure Plan Wound Cleansing: Wound #1 Right Gluteal fold: Cleanse wound with mild soap and  water Anesthetic (add to Medication List): Wound #1 Right Gluteal fold: Topical Lidocaine 4% cream applied to wound bed prior to debridement (In Clinic Only). Skin Barriers/Peri-Wound Care: Wound #1 Right Gluteal fold: Skin Prep Primary Wound Dressing: Wound #1 Right Gluteal fold: Other: - endoform Secondary Dressing: Wound #1 Right Gluteal fold: Boardered Foam Dressing Drawtex - place over the endoform Dressing Change Frequency: Wound #1 Right Gluteal fold: Change dressing every week - outer dressing may be changed if it gets wet. If dressing comes off may replace with endoform Follow-up Appointments: Wound #1 Right Gluteal fold: Return Appointment in 2 weeks. Off-Loading: Wound #1 Right Gluteal fold: Other: - Keep pressure off of area I'm gonna suggest currently that we continue with the above wound care measures for the next week. The patient is in agreement with the plan. We will subsequently see were things stand at follow-up. Please see above for specific wound care orders. We will see patient for re-evaluation in 2 week(s) here in the clinic. If anything worsens or changes patient will contact our office for additional recommendations. Electronic Signature(s) Signed: 05/05/2018 11:13:36 AM By: Conchita Paris, [place] ([id number]) Entered By: Worthy Keeler on 05/03/2018 12:47:13 STANLEY, LYNESS ([id number]) -------------------------------------------------------------------------------- ROS/PFSH Details Patient Name: [name] Date of Service: 05/03/2018 11:00 AM Medical Record Number: [id number]  Patient Account Number: [id number] Date of Birth/Sex: Jul 13, 1951 (67 y.o. M) Treating RN: [name] Primary Care Provider: [name] Other Clinician: Referring Provider: [name] Treating Provider/Extender: [name], HOYT Weeks in Treatment: 22 Information Obtained From Patient Wound History Do you currently have one or  more open woundso Yes How many open wounds do you currently haveo 1 Approximately how long have you had your woundso 1 month How have you been treating your wound(s) until nowo cream Has your wound(s) ever healed and then re-openedo No Have you had any lab work done in the past montho No Have you tested positive for an antibiotic resistant organism (MRSA, VRE)o No Have you tested positive for osteomyelitis (bone infection)o No Have you had any tests for circulation on your legso No Constitutional Symptoms (General Health) Complaints and Symptoms: Negative for: Fever; Chills Eyes Medical History: Positive for: Cataracts - both removed Negative for: Glaucoma; Optic Neuritis Ear/Nose/Mouth/Throat Medical History: Negative for: Chronic sinus problems/congestion; Middle ear problems Hematologic/Lymphatic Medical History: Negative for: Anemia; Hemophilia; Human Immunodeficiency Virus; Lymphedema Respiratory Complaints and Symptoms: No Complaints or Symptoms Medical History: Negative for: Aspiration; Asthma; Chronic Obstructive Pulmonary Disease (COPD); Pneumothorax; Sleep Apnea; Tuberculosis Cardiovascular Complaints and Symptoms: No Complaints or Symptoms Medical History: Positive for: Hypertension - takes medication QUARON, DELACRUZ. ([id number]) Negative for: Angina; Arrhythmia; Congestive Heart Failure; Coronary Artery Disease; Deep Vein Thrombosis; Hypotension; Myocardial Infarction; Peripheral Arterial Disease; Peripheral Venous Disease; Phlebitis; Vasculitis Gastrointestinal Medical History: Negative for: Cirrhosis ; Colitis; Crohnos; Hepatitis A; Hepatitis B; Hepatitis C Endocrine Medical History: Negative for: Type I Diabetes; Type II Diabetes Genitourinary Medical History: Negative for: End Stage Renal Disease Immunological Medical History: Negative for: Lupus Erythematosus; Raynaudos; Scleroderma Integumentary (Skin) Medical History: Negative for: History of  Burn; History of pressure wounds Musculoskeletal Medical History: Negative for: Gout; Rheumatoid Arthritis; Osteoarthritis; Osteomyelitis Neurologic Medical History: Positive for: Paraplegia - waist down Negative for: Dementia; Neuropathy; Quadriplegia; Seizure Disorder Oncologic Medical History: Negative for: Received Chemotherapy; Received Radiation Past Medical History Notes: Prostate cancer- currently treated with horomone therapy Psychiatric Complaints and Symptoms: No Complaints or Symptoms Medical History: Negative for: Anorexia/bulimia; Confinement Anxiety HBO Extended History Items Eyes: Cataracts Immunizations VON, QUINTANAR ([id number]) Pneumococcal Vaccine: Received Pneumococcal Vaccination: No Implantable Devices Family and Social History Cancer: No; Diabetes: No; Heart Disease: No; Hypertension: Yes - Father; Kidney Disease: No; Lung Disease: No; Seizures: No; Stroke: Yes - Mother; Thyroid Problems: No; Tuberculosis: No; Never smoker; Marital Status - Married; Alcohol Use: Never; Drug Use: No History; Caffeine Use: Daily; Advanced Directives: Yes (Copy provided); Patient does not want information on Advanced Directives; Living Will: Yes (Copy provided) Physician Affirmation I have reviewed and agree with the above information. Electronic Signature(s) Signed: 05/03/2018 6:22:02 PM By: Gretta Cool, BSN, RN, CWS, Kim RN, BSN Signed: 05/05/2018 11:13:36 AM By: Worthy Keeler [name] Entered By: Worthy Keeler on 05/03/2018 12:46:37 DELMO, MATTY ([id number]) -------------------------------------------------------------------------------- SuperBill Details Patient Name: [name] Date of Service: 05/03/2018 Medical Record Number: [id number] Patient Account Number: [id number] Date of Birth/Sex: Jun 27, 1951 (67 y.o. 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

## 2018-05-10 ENCOUNTER — Ambulatory Visit: Payer: Medicare Other | Admitting: Physician Assistant

## 2018-05-17 ENCOUNTER — Encounter: Payer: Medicare Other | Admitting: Physician Assistant

## 2018-05-17 DIAGNOSIS — Z87891 Personal history of nicotine dependence: Secondary | ICD-10-CM

## 2018-05-17 DIAGNOSIS — A419 Sepsis, unspecified organism: Principal | ICD-10-CM | POA: Diagnosis present

## 2018-05-17 DIAGNOSIS — N319 Neuromuscular dysfunction of bladder, unspecified: Secondary | ICD-10-CM | POA: Diagnosis present

## 2018-05-17 DIAGNOSIS — Z9221 Personal history of antineoplastic chemotherapy: Secondary | ICD-10-CM

## 2018-05-17 DIAGNOSIS — Z79899 Other long term (current) drug therapy: Secondary | ICD-10-CM

## 2018-05-17 DIAGNOSIS — L89314 Pressure ulcer of right buttock, stage 4: Secondary | ICD-10-CM | POA: Diagnosis present

## 2018-05-17 DIAGNOSIS — L89219 Pressure ulcer of right hip, unspecified stage: Secondary | ICD-10-CM | POA: Diagnosis present

## 2018-05-17 DIAGNOSIS — I10 Essential (primary) hypertension: Secondary | ICD-10-CM | POA: Diagnosis present

## 2018-05-17 DIAGNOSIS — Z8673 Personal history of transient ischemic attack (TIA), and cerebral infarction without residual deficits: Secondary | ICD-10-CM

## 2018-05-17 DIAGNOSIS — G8220 Paraplegia, unspecified: Secondary | ICD-10-CM | POA: Diagnosis present

## 2018-05-17 DIAGNOSIS — R509 Fever, unspecified: Secondary | ICD-10-CM | POA: Diagnosis not present

## 2018-05-17 DIAGNOSIS — L89154 Pressure ulcer of sacral region, stage 4: Secondary | ICD-10-CM | POA: Diagnosis present

## 2018-05-17 DIAGNOSIS — Z8546 Personal history of malignant neoplasm of prostate: Secondary | ICD-10-CM

## 2018-05-19 ENCOUNTER — Emergency Department: Payer: Medicare Other

## 2018-05-19 ENCOUNTER — Inpatient Hospital Stay
Admission: EM | Admit: 2018-05-19 | Discharge: 2018-05-22 | DRG: 871 | Disposition: A | Discharge status: Home Health | Payer: Medicare Other | Attending: Internal Medicine | Admitting: Internal Medicine

## 2018-05-19 DIAGNOSIS — T148XXA Other injury of unspecified body region, initial encounter: Secondary | ICD-10-CM

## 2018-05-19 DIAGNOSIS — L8990 Pressure ulcer of unspecified site, unspecified stage: Secondary | ICD-10-CM

## 2018-05-19 DIAGNOSIS — D72829 Elevated white blood cell count, unspecified: Secondary | ICD-10-CM

## 2018-05-19 DIAGNOSIS — L089 Local infection of the skin and subcutaneous tissue, unspecified: Secondary | ICD-10-CM

## 2018-05-19 DIAGNOSIS — R7989 Other specified abnormal findings of blood chemistry: Secondary | ICD-10-CM

## 2018-05-19 DIAGNOSIS — R509 Fever, unspecified: Secondary | ICD-10-CM

## 2018-05-19 DIAGNOSIS — A419 Sepsis, unspecified organism: Secondary | ICD-10-CM | POA: Diagnosis present

## 2018-05-19 DIAGNOSIS — L89314 Pressure ulcer of right buttock, stage 4: Secondary | ICD-10-CM

## 2018-05-19 LAB — CBC WITH DIFFERENTIAL/PLATELET
Abs Immature Granulocytes: 0.08 10*3/uL — ABNORMAL HIGH (ref 0.00–0.07)
BASOS ABS: 0 10*3/uL (ref 0.0–0.1)
BASOS PCT: 0 %
EOS ABS: 0 10*3/uL (ref 0.0–0.5)
EOS PCT: 0 %
HEMATOCRIT: 39.4 % (ref 39.0–52.0)
Hemoglobin: 13.3 g/dL (ref 13.0–17.0)
Immature Granulocytes: 1 %
Lymphocytes Relative: 6 %
Lymphs Abs: 0.9 10*3/uL (ref 0.7–4.0)
MCH: 29.6 pg (ref 26.0–34.0)
MCHC: 33.8 g/dL (ref 30.0–36.0)
MCV: 87.8 fL (ref 80.0–100.0)
MONOS PCT: 6 %
Monocytes Absolute: 0.9 10*3/uL (ref 0.1–1.0)
NEUTROS PCT: 87 %
NRBC: 0 % (ref 0.0–0.2)
Neutro Abs: 12.7 10*3/uL — ABNORMAL HIGH (ref 1.7–7.7)
PLATELETS: 249 10*3/uL (ref 150–400)
RBC: 4.49 MIL/uL (ref 4.22–5.81)
RDW: 11.6 % (ref 11.5–15.5)
WBC: 14.7 10*3/uL — ABNORMAL HIGH (ref 4.0–10.5)

## 2018-05-19 LAB — COMPREHENSIVE METABOLIC PANEL
ALT: 10 U/L (ref 0–44)
ANION GAP: 11 (ref 5–15)
AST: 19 U/L (ref 15–41)
Albumin: 3.8 g/dL (ref 3.5–5.0)
Alkaline Phosphatase: 53 U/L (ref 38–126)
BUN: 18 mg/dL (ref 8–23)
CALCIUM: 8.8 mg/dL — AB (ref 8.9–10.3)
CO2: 25 mmol/L (ref 22–32)
CREATININE: 0.81 mg/dL (ref 0.61–1.24)
Chloride: 100 mmol/L (ref 98–111)
Glucose, Bld: 187 mg/dL — ABNORMAL HIGH (ref 70–99)
Potassium: 3.5 mmol/L (ref 3.5–5.1)
SODIUM: 136 mmol/L (ref 135–145)
Total Bilirubin: 0.7 mg/dL (ref 0.3–1.2)
Total Protein: 7.7 g/dL (ref 6.5–8.1)

## 2018-05-19 LAB — LIPASE, BLOOD: Lipase: 20 U/L (ref 11–51)

## 2018-05-19 LAB — PROTIME-INR
INR: 1.32
Prothrombin Time: 16.2 seconds — ABNORMAL HIGH (ref 11.4–15.2)

## 2018-05-19 LAB — TROPONIN I: Troponin I: <0.03 ng/mL (ref <0.03)

## 2018-05-19 MED ORDER — VANCOMYCIN HCL IN DEXTROSE 1-5 GM/200ML-% IV SOLN
1000.0000 mg | Freq: Once | INTRAVENOUS | Status: AC
  Administered 2018-05-19: 1000 mg via INTRAVENOUS
  Filled 2018-05-19: qty 200

## 2018-05-19 MED ORDER — SODIUM CHLORIDE 0.9 % IV SOLN
2.0000 g | INTRAVENOUS | Status: DC
  Administered 2018-05-19: 2 g via INTRAVENOUS
  Filled 2018-05-19 (×2): qty 20

## 2018-05-19 MED ORDER — SODIUM CHLORIDE 0.9 % IV BOLUS (SEPSIS)
1000.0000 mL | Freq: Once | INTRAVENOUS | Status: AC
  Administered 2018-05-19: 1000 mL via INTRAVENOUS

## 2018-05-19 NOTE — ED Triage Notes (Signed)
Patient c/o pressure ulcer to right buttock beginning in May. Patient reports purulent drainage and fever at home. Patient has been being evaluated at wound clinic.

## 2018-05-19 NOTE — Progress Notes (Signed)
CODE SEPSIS - PHARMACY COMMUNICATION  **Broad Spectrum Antibiotics should be administered within 1 hour of Sepsis diagnosis**  Time Code Sepsis Called/Page Received: 10/30 2321  Antibiotics Ordered: 10/30 2321  Time of 1st antibiotic administration: 10/30 2347  Additional action taken by pharmacy:   If necessary, Name of Provider/Nurse Contacted:     Eloise Harman ,PharmD Clinical Pharmacist  05/19/2018  11:52 PM

## 2018-05-19 NOTE — ED Provider Notes (Signed)
[facility] Emergency Department Provider Note  ____________________________________________   First MD Initiated Contact with Patient 05/19/18 2333     (approximate)  I have reviewed the triage vital signs and the nursing notes.   HISTORY  Chief Complaint Wound Check    HPI [name] is a 67 y.o. 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  He presents tonight by private vehicle for evaluation of fever and general malaise.  He says the last week he was running a fever and was started on Cipro for possible urinary tract infection although his urine culture did not reveal any infection.  He just finished his last dose of Cipro today.  In spite of being on the Cipro he started feeling ill again over the last 24 to 48 hours with generalized weakness, general malaise, and fever up to about 101 at home.  His family convinced him to come into the emergency department tonight for further evaluation.  Over the last 24 to 48 hours, his wound has also been draining a large quantity of purulent material and his wife is been changing the dressing and packing more frequently.  The wound care center apparently also saw the wound recently and was afraid that it was getting worse.  He denies chest pain, shortness of breath, cough, nausea, vomiting, abdominal pain, and dysuria.  He describes the symptoms overall as moderate and nothing in particular makes it better or worse.  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  He has no acute focal neurological deficits.  Past Medical History:  Diagnosis Date  . AVM (arteriovenous malformation) spine   . Cancer [facility])    prostate, taking Casodex (hormone chemotherapy)  .  History of kidney stones    several times  . Hypertension   . 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 Prostate cancer ([place])   . Status post insertion of intrathecal baclofen pump   . Status post insertion of spinal cord stimulator     Patient Active Problem List   Diagnosis Date Noted  . Hypertension 02/05/2017  . Essential hypertension 04/07/2016  . Carpal tunnel syndrome, bilateral 06/29/2014  . Anorectal pain 07/20/2012  . Pain syndrome, chronic 07/20/2012  . Lumbar spinal cord injury ([place]) 06/29/2012  . Neurogenic bladder 06/29/2012  . Pain in joint, pelvic region and thigh 06/29/2012    Past Surgical History:  Procedure Laterality Date  . COLONOSCOPY    . CYSTOSCOPY     several times  . CYSTOSCOPY WITH DIRECT VISION INTERNAL URETHROTOMY  05/05/2017   Procedure: CYSTOSCOPY WITH DIRECT VISION INTERNAL URETHROTOMY;  Surgeon: Royston Cowper, MD;  Location: [facility];  Service: Urology;;  . Consuela Mimes WITH LITHOLAPAXY N/A 05/05/2017   Procedure: CYSTOSCOPY WITH LITHOLAPAXY;  Surgeon: Royston Cowper, MD;  Location: [facility];  Service: Urology;  Laterality: N/A;  . EXTRACORPOREAL SHOCK WAVE LITHOTRIPSY    . EYE SURGERY Bilateral    cataract surgery  . HOLMIUM LASER APPLICATION N/A 01/18/1600   Procedure: HOLMIUM LASER APPLICATION;  Surgeon: Royston Cowper, MD;  Location: [facility];  Service: Urology;  Laterality: N/A;  . LUMBAR [place] SURGERY  2005   resulted in  paralysis of lower extrems    Prior to Admission medications   Medication Sig Start Date End Date Taking? Authorizing Provider  baclofen (LIORESAL) 10 MG tablet Take 10 mg by mouth daily as needed for muscle spasms.     [provider]  bicalutamide (CASODEX) 50 MG tablet Take 50 mg by mouth daily. 01/06/17   [provider]  gabapentin (NEURONTIN) 800 MG tablet Take 800 mg by mouth 3 (three) times daily.     [provider]  levofloxacin (LEVAQUIN) 500 MG tablet Take  1 tablet (500 mg total) by mouth daily. 05/05/17   Royston Cowper, MD  losartan (COZAAR) 100 MG tablet Take 100 mg by mouth daily.    [provider]  methenamine (HIPREX) 1 g tablet Take 1 g by mouth 4 (four) times daily.    [provider]  mirabegron ER (MYRBETRIQ) 50 MG TB24 tablet Take 50 mg by mouth daily.     [provider]  oxybutynin (DITROPAN-XL) 10 MG 24 hr tablet Take 10 mg by mouth every morning.     [provider]  PAIN MANAGEMENT INTRATHECAL, IT, PUMP 1 each by Intrathecal route continuous. Intrathecal (IT) medication:  Baclofen, Dilaudid    [provider]  polyethylene glycol powder (GLYCOLAX/MIRALAX) powder Take 17 g by mouth daily.  08/09/10   [provider]  Propylene Glycol (SYSTANE BALANCE) 0.6 % SOLN Place 1 drop into both eyes daily as needed (dry eyes).    [provider]  vitamin C (ASCORBIC ACID) 500 MG tablet Take 1,000 mg by mouth 3 (three) times daily.     [provider]    Allergies Clindamycin; Doxycycline; Penicillins; and Sulfamethoxazole  No family history on file.  Social History Social History   Tobacco Use  . Smoking status: Never Smoker  . Smokeless tobacco: Former Systems developer    Types: Snuff  Substance Use Topics  . Alcohol use: No  . Drug use: No    Review of Systems Constitutional: +fever, general malaise Eyes: No visual changes. ENT: No sore throat. Cardiovascular: Denies chest pain. Respiratory: Denies shortness of breath. Gastrointestinal: No abdominal pain.  No nausea, no vomiting.  No diarrhea.  No constipation. Genitourinary: Negative for dysuria. Musculoskeletal: Negative for neck pain.  Negative for back pain. Integumentary: chronic wound in right buttocks Neurological: Chronic lower extremity paralysis.  Negative for headaches, focal weakness or numbness.   ____________________________________________   PHYSICAL EXAM:  VITAL SIGNS: ED Triage Vitals    Enc Vitals Group     BP 05/19/18 2251 114/71     Pulse Rate 05/19/18 2251 (!) 122     Resp 05/19/18 2251 20     Temp 05/19/18 2251 (!) 100.7 F (38.2 C)     Temp Source 05/19/18 2251 Oral     SpO2 05/19/18 2251 96 %     Weight 05/19/18 2251 95.3 kg (210 lb)     Height 05/19/18 2251 1.854 m (6\' 1" )     Head Circumference --      Peak Flow --      Pain Score 05/19/18 2255 9     Pain Loc --      Pain Edu? --      Excl. in [place]? --     Constitutional: Alert and oriented. Well appearing and in no acute distress. Eyes: Conjunctivae are normal.  Head: Atraumatic. Nose: No congestion/rhinnorhea. Mouth/Throat: Mucous membranes are moist. Neck: No stridor.  No meningeal signs.   Cardiovascular: sinus  tachycardia in the 120s, regular rhythm. Good peripheral circulation. Grossly normal heart sounds. Respiratory: Normal respiratory effort.  No retractions. Lungs CTAB. Gastrointestinal: Soft and nontender. No distention.  Musculoskeletal: No lower extremity tenderness nor edema. No gross deformities of extremities. Neurologic:  Normal speech and language. No gross focal neurologic deficits are appreciated.  Skin:  Skin is warm and dry.  Chronic purulent wound in the right buttock.  Some purulent drainage identified.  Dressing removed and culture swab sent.   ____________________________________________   LABS (all labs ordered are listed, but only abnormal results are displayed)  Labs Reviewed  COMPREHENSIVE METABOLIC PANEL - Abnormal; Notable for the following components:      Result Value   Glucose, Bld 187 (*)    Calcium 8.8 (*)    All other components within normal limits  LACTIC ACID, PLASMA - Abnormal; Notable for the following components:   Lactic Acid, Venous 2.6 (*)    All other components within normal limits  CBC WITH DIFFERENTIAL/PLATELET - Abnormal; Notable for the following components:   WBC 14.7 (*)    Neutro Abs 12.7 (*)    Abs Immature Granulocytes 0.08 (*)    All  other components within normal limits  PROTIME-INR - Abnormal; Notable for the following components:   Prothrombin Time 16.2 (*)    All other components within normal limits  CULTURE, BLOOD (ROUTINE X 2)  CULTURE, BLOOD (ROUTINE X 2)  URINE CULTURE  AEROBIC/ANAEROBIC CULTURE (SURGICAL/DEEP WOUND)  LIPASE, BLOOD  TROPONIN I  LACTIC ACID, PLASMA  URINALYSIS, COMPLETE (UACMP) WITH MICROSCOPIC  PROCALCITONIN   ____________________________________________  EKG  ED ECG REPORT I, Hinda Kehr, the attending physician, personally viewed and interpreted this ECG.  Date: 05/20/2018 EKG Time: 00: 17 Rate: 95 Rhythm: normal sinus rhythm QRS Axis: normal Intervals: normal ST/T Wave abnormalities: normal Narrative Interpretation: no evidence of acute ischemia  ____________________________________________  RADIOLOGY   ED MD interpretation: No indication of pneumonia  Official radiology report(s): Dg Chest Port 1 View  Result Date: 05/19/2018 CLINICAL DATA:  67 year old male with sepsis. EXAM: PORTABLE CHEST 1 VIEW COMPARISON:  Chest radiograph dated 02/18/2017 FINDINGS: The lungs are clear. There is no pleural effusion or pneumothorax. The cardiac silhouette is within normal limits. Spinal stimulator noted over the lower thoracic spine. No acute osseous pathology. IMPRESSION: No active disease. Electronically Signed   By: Anner Crete M.D.   On: 05/19/2018 23:43    ____________________________________________   PROCEDURES  Critical Care performed: Yes, see critical care procedure note(s)   Procedure(s) performed:   .Critical Care Performed by: Hinda Kehr, MD Authorized by: Hinda Kehr, MD   Critical care provider statement:    Critical care time (minutes):  30   Critical care time was exclusive of:  Separately billable procedures and treating other patients   Critical care was necessary to treat or prevent imminent or life-threatening deterioration of the  following conditions:  Sepsis   Critical care was time spent personally by me on the following activities:  Development of treatment plan with patient or surrogate, discussions with consultants, evaluation of patient's response to treatment, examination of patient, obtaining history from patient or surrogate, ordering and performing treatments and interventions, ordering and review of laboratory studies, ordering and review of radiographic studies, pulse oximetry, re-evaluation of patient's condition and review of old charts     ____________________________________________   INITIAL IMPRESSION / [place] / ED COURSE  As part of my medical decision making, I reviewed the following data  within the [place] History obtained from family, Nursing notes reviewed and incorporated, Labs reviewed , EKG interpreted , radiograph reviewed, Old chart reviewed, Discussed with admitting physician  and Notes from prior ED visits    Differential diagnosis includes, but is not limited to, sepsis secondary to wound infection, bacteremia, other infection such as pneumonia or urinary tract infection.  Influenza is much less likely.  The patient has no respiratory symptoms and no other signs or symptoms of infection.  While a viral illness is possible, it is much more likely that his wound is getting worse and has led to systemic infection and possible bacteremia.  I am covering broadly with ceftriaxone 2 g IV and vancomycin 1 g IV, providing 1 L of normal saline, and will reassess after lab work is back to determine if he meets criteria for severe sepsis and requires 30 mL/kg of crystalloid.  At this point he is stable but tachycardic in the 120s, no altered mental status, and in no pain at this time.  Clinical Course as of May 20 125  Thu May 20, 2018  0021 Lactate is 2.6 which strongly suggest sepsis but he does not meet criteria for severe sepsis.  His blood pressure is stable and his  heart rate has dropped below 100 with 1 L of fluids currently being administered.  He is also getting additional fluids as carrier fluids for his antibiotics.  I will discuss the case with the hospitalist for admission.  Lab work is notable for leukocytosis of about 15 and a normal conference of metabolic panel, procalcitonin is pending as is urinalysis.  Lactic Acid, Venous(!!): 2.6 [CF]  0024 Reassuring procalcitonin  Procalcitonin: 0.24 [CF]    Clinical Course User Index [CF] Hinda Kehr, MD    ____________________________________________  FINAL CLINICAL IMPRESSION(S) / ED DIAGNOSES  Final diagnoses:  Sepsis without acute organ dysfunction, due to unspecified organism ([place])  Wound infection  Fever, unspecified fever cause  Elevated lactic acid level  Leukocytosis, unspecified type     MEDICATIONS GIVEN DURING THIS VISIT:  Medications  sodium chloride 0.9 % bolus 1,000 mL (1,000 mLs Intravenous New Bag/Given 05/19/18 2334)  vancomycin (VANCOCIN) IVPB 1000 mg/200 mL premix (1,000 mg Intravenous New Bag/Given 05/19/18 2349)  cefTRIAXone (ROCEPHIN) 2 g in sodium chloride 0.9 % 100 mL IVPB (0 g Intravenous Stopped 05/20/18 0017)     ED Discharge Orders    None       Note:  This document was prepared using Dragon voice recognition software and may include unintentional dictation errors.    Hinda Kehr, MD 05/20/18 [id number]

## 2018-05-19 NOTE — ED Notes (Signed)
ED Provider at bedside. 

## 2018-05-19 NOTE — ED Notes (Signed)
X-ray at bedside

## 2018-05-20 ENCOUNTER — Other Ambulatory Visit: Payer: Self-pay

## 2018-05-20 DIAGNOSIS — A419 Sepsis, unspecified organism: Secondary | ICD-10-CM | POA: Diagnosis present

## 2018-05-20 DIAGNOSIS — Z8546 Personal history of malignant neoplasm of prostate: Secondary | ICD-10-CM | POA: Diagnosis not present

## 2018-05-20 DIAGNOSIS — I10 Essential (primary) hypertension: Secondary | ICD-10-CM | POA: Diagnosis present

## 2018-05-20 DIAGNOSIS — G8220 Paraplegia, unspecified: Secondary | ICD-10-CM | POA: Diagnosis present

## 2018-05-20 DIAGNOSIS — R509 Fever, unspecified: Secondary | ICD-10-CM | POA: Diagnosis present

## 2018-05-20 DIAGNOSIS — L89314 Pressure ulcer of right buttock, stage 4: Secondary | ICD-10-CM | POA: Diagnosis present

## 2018-05-20 DIAGNOSIS — Z87891 Personal history of nicotine dependence: Secondary | ICD-10-CM | POA: Diagnosis not present

## 2018-05-20 DIAGNOSIS — L89154 Pressure ulcer of sacral region, stage 4: Secondary | ICD-10-CM | POA: Diagnosis present

## 2018-05-20 DIAGNOSIS — N319 Neuromuscular dysfunction of bladder, unspecified: Secondary | ICD-10-CM | POA: Diagnosis present

## 2018-05-20 DIAGNOSIS — L89219 Pressure ulcer of right hip, unspecified stage: Secondary | ICD-10-CM | POA: Diagnosis present

## 2018-05-20 DIAGNOSIS — Z79899 Other long term (current) drug therapy: Secondary | ICD-10-CM | POA: Diagnosis not present

## 2018-05-20 DIAGNOSIS — L8990 Pressure ulcer of unspecified site, unspecified stage: Secondary | ICD-10-CM

## 2018-05-20 DIAGNOSIS — Z9221 Personal history of antineoplastic chemotherapy: Secondary | ICD-10-CM | POA: Diagnosis not present

## 2018-05-20 DIAGNOSIS — Z8673 Personal history of transient ischemic attack (TIA), and cerebral infarction without residual deficits: Secondary | ICD-10-CM | POA: Diagnosis not present

## 2018-05-20 LAB — URINALYSIS, COMPLETE (UACMP) WITH MICROSCOPIC
Bacteria, UA: NONE SEEN
Bilirubin Urine: NEGATIVE
Glucose, UA: NEGATIVE mg/dL
HGB URINE DIPSTICK: NEGATIVE
Ketones, ur: NEGATIVE mg/dL
NITRITE: NEGATIVE
PROTEIN: NEGATIVE mg/dL
Specific Gravity, Urine: 1.023 (ref 1.005–1.030)
pH: 5 (ref 5.0–8.0)

## 2018-05-20 LAB — LACTIC ACID, PLASMA
Lactic Acid, Venous: 0.6 mmol/L (ref 0.5–1.9)
Lactic Acid, Venous: 2.6 mmol/L (ref 0.5–1.9)

## 2018-05-20 LAB — PROCALCITONIN: PROCALCITONIN: 0.24 ng/mL

## 2018-05-20 LAB — TSH: TSH: 0.6 u[IU]/mL (ref 0.350–4.500)

## 2018-05-20 MED ORDER — OCUVITE-LUTEIN PO CAPS
1.0000 | ORAL_CAPSULE | Freq: Every day | ORAL | Status: DC
  Administered 2018-05-21: 1 via ORAL
  Filled 2018-05-20 (×2): qty 1

## 2018-05-20 MED ORDER — POLYETHYLENE GLYCOL 3350 17 GM/SCOOP PO POWD
17.0000 g | Freq: Every day | ORAL | Status: DC
  Filled 2018-05-20: qty 255

## 2018-05-20 MED ORDER — METHENAMINE MANDELATE 1 G PO TABS
1000.0000 mg | ORAL_TABLET | Freq: Four times a day (QID) | ORAL | Status: DC
  Administered 2018-05-20 – 2018-05-22 (×9): 1000 mg via ORAL
  Filled 2018-05-20 (×12): qty 1

## 2018-05-20 MED ORDER — BICALUTAMIDE 50 MG PO TABS
50.0000 mg | ORAL_TABLET | Freq: Every day | ORAL | Status: DC
  Administered 2018-05-20 – 2018-05-22 (×3): 50 mg via ORAL
  Filled 2018-05-20 (×3): qty 1

## 2018-05-20 MED ORDER — ONDANSETRON HCL 4 MG PO TABS: 4.0000 mg | ORAL_TABLET | Freq: Four times a day (QID) | ORAL | Status: DC | PRN

## 2018-05-20 MED ORDER — GABAPENTIN 400 MG PO CAPS
800.0000 mg | ORAL_CAPSULE | Freq: Three times a day (TID) | ORAL | Status: DC
  Administered 2018-05-20 – 2018-05-22 (×7): 800 mg via ORAL
  Filled 2018-05-20 (×7): qty 2

## 2018-05-20 MED ORDER — BACLOFEN 10 MG PO TABS
10.0000 mg | ORAL_TABLET | Freq: Every day | ORAL | Status: DC | PRN
  Administered 2018-05-21: 10 mg via ORAL
  Filled 2018-05-20 (×2): qty 1

## 2018-05-20 MED ORDER — PIPERACILLIN-TAZOBACTAM 3.375 G IVPB 30 MIN: 3.3750 g | Freq: Four times a day (QID) | INTRAVENOUS | Status: DC

## 2018-05-20 MED ORDER — ONDANSETRON HCL 4 MG/2ML IJ SOLN: 4.0000 mg | Freq: Four times a day (QID) | INTRAMUSCULAR | Status: DC | PRN

## 2018-05-20 MED ORDER — VANCOMYCIN HCL 10 G IV SOLR
1250.0000 mg | Freq: Three times a day (TID) | INTRAVENOUS | Status: DC
  Administered 2018-05-20: 1250 mg via INTRAVENOUS
  Filled 2018-05-20 (×3): qty 1250

## 2018-05-20 MED ORDER — ACETAMINOPHEN 325 MG PO TABS
650.0000 mg | ORAL_TABLET | Freq: Four times a day (QID) | ORAL | Status: DC | PRN
  Administered 2018-05-20 (×3): 650 mg via ORAL
  Filled 2018-05-20 (×3): qty 2

## 2018-05-20 MED ORDER — PROPYLENE GLYCOL 0.6 % OP SOLN: 1.0000 [drp] | Freq: Every day | OPHTHALMIC | Status: DC | PRN

## 2018-05-20 MED ORDER — COLLAGENASE 250 UNIT/GM EX OINT
TOPICAL_OINTMENT | Freq: Every day | CUTANEOUS | Status: DC
  Administered 2018-05-20: 1 via TOPICAL
  Administered 2018-05-21 – 2018-05-22 (×2): via TOPICAL
  Filled 2018-05-20: qty 30

## 2018-05-20 MED ORDER — VITAMIN C 500 MG PO TABS
1000.0000 mg | ORAL_TABLET | Freq: Three times a day (TID) | ORAL | Status: DC
  Administered 2018-05-20: 1000 mg via ORAL
  Filled 2018-05-20: qty 2

## 2018-05-20 MED ORDER — JUVEN PO PACK
1.0000 | PACK | Freq: Two times a day (BID) | ORAL | Status: DC
  Administered 2018-05-20 – 2018-05-22 (×4): 1 via ORAL

## 2018-05-20 MED ORDER — OXYBUTYNIN CHLORIDE ER 5 MG PO TB24
10.0000 mg | ORAL_TABLET | ORAL | Status: DC
  Administered 2018-05-20 – 2018-05-22 (×3): 10 mg via ORAL
  Filled 2018-05-20 (×3): qty 2

## 2018-05-20 MED ORDER — PREMIER PROTEIN SHAKE
11.0000 [oz_av] | ORAL | Status: DC
  Administered 2018-05-20 – 2018-05-21 (×2): 11 [oz_av] via ORAL

## 2018-05-20 MED ORDER — PAIN MANAGEMENT INTRATHECAL (IT) PUMP: 1.0000 | Status: DC

## 2018-05-20 MED ORDER — SENNOSIDES-DOCUSATE SODIUM 8.6-50 MG PO TABS: 1.0000 | ORAL_TABLET | Freq: Every evening | ORAL | Status: DC | PRN

## 2018-05-20 MED ORDER — ENOXAPARIN SODIUM 40 MG/0.4ML ~~LOC~~ SOLN
40.0000 mg | SUBCUTANEOUS | Status: DC
  Administered 2018-05-20 – 2018-05-21 (×2): 40 mg via SUBCUTANEOUS
  Filled 2018-05-20 (×2): qty 0.4

## 2018-05-20 MED ORDER — VANCOMYCIN HCL 10 G IV SOLR
1250.0000 mg | Freq: Two times a day (BID) | INTRAVENOUS | Status: DC
  Administered 2018-05-20 – 2018-05-22 (×4): 1250 mg via INTRAVENOUS
  Filled 2018-05-20 (×5): qty 1250

## 2018-05-20 MED ORDER — METHENAMINE HIPPURATE 1 G PO TABS: 1.0000 g | ORAL_TABLET | Freq: Four times a day (QID) | ORAL | Status: DC

## 2018-05-20 MED ORDER — MIRABEGRON ER 50 MG PO TB24
50.0000 mg | ORAL_TABLET | Freq: Every day | ORAL | Status: DC
  Administered 2018-05-20 – 2018-05-22 (×3): 50 mg via ORAL
  Filled 2018-05-20 (×3): qty 1

## 2018-05-20 MED ORDER — POLYVINYL ALCOHOL 1.4 % OP SOLN
1.0000 [drp] | Freq: Every day | OPHTHALMIC | Status: DC | PRN
  Filled 2018-05-20: qty 15

## 2018-05-20 MED ORDER — SODIUM CHLORIDE 0.9 % IV SOLN
2.0000 g | Freq: Two times a day (BID) | INTRAVENOUS | Status: DC
  Administered 2018-05-20 – 2018-05-22 (×4): 2 g via INTRAVENOUS
  Filled 2018-05-20 (×5): qty 2

## 2018-05-20 MED ORDER — ACETAMINOPHEN 650 MG RE SUPP: 650.0000 mg | Freq: Four times a day (QID) | RECTAL | Status: DC | PRN

## 2018-05-20 MED ORDER — LOSARTAN POTASSIUM 50 MG PO TABS
100.0000 mg | ORAL_TABLET | Freq: Every day | ORAL | Status: DC
  Administered 2018-05-20 – 2018-05-22 (×3): 100 mg via ORAL
  Filled 2018-05-20 (×3): qty 2

## 2018-05-20 MED ORDER — SODIUM CHLORIDE 0.9 % IV SOLN
INTRAVENOUS | Status: DC
  Administered 2018-05-20 (×2): via INTRAVENOUS

## 2018-05-20 MED ORDER — POLYETHYLENE GLYCOL 3350 17 G PO PACK
17.0000 g | PACK | Freq: Every day | ORAL | Status: DC
  Administered 2018-05-20 – 2018-05-22 (×3): 17 g via ORAL
  Filled 2018-05-20 (×3): qty 1

## 2018-05-20 MED ORDER — VITAMIN C 500 MG PO TABS
250.0000 mg | ORAL_TABLET | Freq: Three times a day (TID) | ORAL | Status: DC
  Administered 2018-05-20 – 2018-05-22 (×6): 250 mg via ORAL
  Filled 2018-05-20 (×6): qty 1

## 2018-05-20 NOTE — Consult Note (Signed)
[facility] Nurse wound consult note Reason for Consult: right ischial unstageable, pt states has a deep hole they used to pack but can not see a hole currently. 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 Pt sits in wheel chair most of time. Does not have a wheel chair pad that alternates pressure, will order one to take home. Wound type:pressure Pressure Injury POA: Yes Measurement:6cm x 5cm x 0.2cm Wound bed: 90% yellow slough, 91% slick, non granulating pink wound bed. Drainage (amount, consistency, odor) copious yellow Periwound:macerated from drainage. Dressing procedure/placement/frequency:I have provided nurses with orders for Santyl for enzymatic debridement, topped with NS moistened gauze TO ACTIVATE SANTYL. Pt instructed on shifting weight frequently and using pressure alternating pad in wheel chair. Nutrition level is normal. We will not follow, but will remain available to this patient, to nursing, and the medical and/or surgical teams.  Please re-consult if we need to assist further.  Fara Olden, RN-C, WTA-C, [facility] Wound Treatment Associate

## 2018-05-20 NOTE — Progress Notes (Signed)
WENDLE, KINA ([id number]) Visit Report for 05/17/2018 Chief Complaint Document Details Patient Name: [name], [name] Date of Service: 05/17/2018 10:00 AM Medical Record Number: [id number] Patient Account Number: [id number] Date of Birth/Sex: 1950-10-26 (67 y.o. M) Treating RN: [name] Primary Care Provider: [name] Other Clinician: Referring Provider: [name] Treating Provider/Extender: [name], Osei Anger Weeks in Treatment: 24 Information Obtained from: Patient Chief Complaint Right gluteal fold ulcer Electronic Signature(s) Signed: 05/19/2018 1:38:45 AM By: Worthy Keeler [name] Entered By: Worthy Keeler on 05/17/2018 10:16:51 KYLO, GAVIN ([id number]) -------------------------------------------------------------------------------- Debridement Details Patient Name: [name] Date of Service: 05/17/2018 10:00 AM Medical Record Number: [id number] Patient Account Number: [id number] Date of Birth/Sex: 03/10/1951 (67 y.o. M) Treating RN: [name] Primary Care Provider: [name] Other Clinician: Referring Provider: [name] Treating Provider/Extender: [name], Azahel Belcastro Weeks in Treatment: 24 Debridement Performed for Wound #1 Right Gluteal fold Assessment: Performed By: Physician [name], Suprena Travaglini E., [name] Debridement Type: Debridement Level of Consciousness (Pre- Awake and Alert procedure): Pre-procedure Verification/Time Yes - 10:21 Out Taken: Start Time: 10:22 Pain Control: Lidocaine Total Area Debrided (L x W): 4.5 (cm) x 3.2 (cm) = 14.4 (cm) Tissue and other material Viable, Non-Viable, Slough, Subcutaneous, Slough debrided: Level: Skin/Subcutaneous Tissue Debridement Description: Excisional Instrument: Curette Bleeding: Moderate Hemostasis Achieved: Pressure End Time: 10:23 Procedural Pain: 0 Post Procedural Pain: 0 Response to Treatment: Procedure was tolerated well Level of Consciousness Awake  and Alert (Post-procedure): Post Debridement Measurements of Total Wound Length: (cm) 4.5 Stage: Category/Stage III Width: (cm) 3.2 Depth: (cm) 2 Volume: (cm) 22.619 Character of Wound/Ulcer Post Stable Debridement: Post Procedure Diagnosis Same as Pre-procedure Electronic Signature(s) Signed: 05/17/2018 3:24:13 PM By: Gretta Cool, BSN, RN, CWS, Kim RN, BSN Signed: 05/19/2018 1:38:45 AM By: Worthy Keeler [name] Entered By: Gretta Cool, BSN, RN, CWS, Kim on 05/17/2018 10:22:15 LOVIE, AGRESTA ([id number]) -------------------------------------------------------------------------------- HPI Details Patient Name: [name] Date of Service: 05/17/2018 10:00 AM Medical Record Number: [id number] Patient Account Number: [id number] Date of Birth/Sex: 1951-01-14 (67 y.o. 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 He states that there is no history of diabetes that he is aware of. He does have issues with his prostate and is currently receiving treatment for this by way of oral medication. With that being said I do not have a lot of details in that regard. Nonetheless the patient presents today as a result of having been referred to [place] by another provider initially home health was set to come out and take care of his wound although due to the fact that he apparently drives he's not able to receive home health. His wife is therefore trying to help take care of this wound within although they have been struggling with what exactly to do at this  point. She states that she can do some things but she is definitely not a nurse and does have some issues with looking at blood. The good news is the wound does not appear to be too deep and is fairly superficial at this point. There is no slough noted there is some nonviable  skin noted around the surface of the wound and the perimeter at this point. The central portion of the wound appears to be very good with a dermal layer noted this does not appear to be again deep enough to extend it to subcutaneous tissue at this point. 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 In the pump he has Dilaudid and baclofen. 12/07/17 on evaluation today patient presents for follow-up concerning his ongoing lower back thigh ulcer on the right. He states that he did not get the supplies ordered and therefore has not really been able to perform the dressing changes as directed exactly. His wife was able to get some Boarder Foam Dressing's from the drugstore and subsequently has been using hydrogel which did help to a degree in the wound does appear to be able smaller. There is actually more drainage this week noted than previous. 12/21/17 on evaluation today patient appears to be doing rather well in regard to his right gluteal ulcer. He has been tolerating the dressing changes without complication. There does not appear to be any evidence of infection at this point in time. Overall the wound does seem to be making some progress as far as the edges are concerned there's not as much in the way of overlapping of the external wound edges and he has a good epithelium to wound bed border for the most part. This however is not true right at the 12 o'clock location over the span of a little over a centimeters which actually will require debridement today to clean this away and hopefully allow it to continue to heal more  appropriately. 12/28/17 on evaluation today patient appears to be doing rather well in regard to his ulcer in the left gluteal region. He's been tolerating the dressing changes without complication. Apparently he has had some difficulty getting his dressing material. Apparently there's been some confusion with ordering we're gonna check into this. Nonetheless overall he's been showing signs of improvement which is good news. Debridement is not required today. 01/04/18 on evaluation today patient presents for follow-up concerning his right gluteal ulcer. He has been tolerating the dressing changes fairly well. On inspection today it appears he may actually have some maceration them concerned about the fact that he may be developing too much moisture in and around the wound bed which can cause delay in healing. With that being said he unfortunately really has not showed significant signs of improvement since last week's evaluation in fact this may even be just the little bit/slightly larger. Nonetheless he's been having a lot of discomfort I'm not sure this is even related to the wound as he has no pain when I'm to breeding or otherwise cleaning the wound during evaluation today. Nonetheless this is something that we did recommend he talked to his pain specialist concerning. 01/11/18 on evaluation today patient appears to be doing better in regard to his ulceration. He has been tolerating the dressing changes without complication. With that being said overall there's no evidence of infection which is good news. The only thing is he did receive the hatch affair blue classic versus the ready nonetheless I feel like this is perfectly fine and appears to have done well for him over the past week. 01/25/18 on evaluation today patient's wound actually appears to be a little bit larger than during the last evaluation. The good JOUD, INGWERSEN. ([id number])  news is the majority of the wound edges actually  appear to be fairly firmly attached to the wound bed unfortunately again we're not really making progress in regard to the size. Roughly the wound is about the same size as when I first saw him although again the wound margin/edges appear to be much better. 02/01/18 on evaluation today patient actually appears to be doing very well in regard to his wound. Applying the Prisma dry does seem to be better although he does still have issues with slow progression of the wound. There was a slight improvement compared to last week's measurements today. Nonetheless I have been considering other options as far as the possibility of Theraskin or even a snap vac. In general I'm not sure that the Theraskin due to location of the wound would be a very good idea. Nonetheless I do think that a snap vac could be a possibility for the patient and in fact I think this could even be an excellent way to manage the wound possibly seeing some improvement in a very rapid fashion here. Nonetheless this is something that we would need to get approved and I did have a lengthy conversation with the patient about this today. 02/08/18 on evaluation today patient appears to be doing a little better in regard to his ulcer. He has been tolerating the dressing changes without complication. Fortunately despite the fact that the wound is a little bit smaller it's not significantly so unfortunately. We have discussed the possibility of a snap vac we did check with insurance this is actually covered at this point. Fortunately there does not appear to be any sign of infection. Overall I'm fairly pleased with how things seem to be appearing at this point. 02/15/18 on evaluation today patient appears to be doing rather well in regard to his right gluteal ulcer. Unfortunately the snap vac did not stay in place with his sheer and friction this came loose and did not seem to maintain seal very well. He worked for about two days and it did seem  to do very well during that time according to his wife but in general this does not seem to be something that's gonna be beneficial for him long-term. I do believe we need to go back to standard dressings to see if we can find something that will be of benefit. 03/02/18- He is here in follow up evaluation; there is minimal change in the wound. He will continue with the same treatment plan, would consider changing to iodosrob/iodoflex if ulcer continues to to plateau. He will follow up next week 03/08/18 on evaluation today patient's wound actually appears to be about the same size as when I previously saw him several weeks back. Unfortunately he does have some slightly dark discoloration in the central portion of the wound which has me concerned about pressure injury. I do believe he may be sitting for too long a period of time in fact he tells me that "I probably sit for much too long". He does have some Slough noted on the surface of the wound and again as far as the size of the wound is concerned I'm really not seeing anything that seems to have improved significantly. 03/15/18 on evaluation today patient appears to be doing fairly well in regard to his ulcer. The wound measured pretty much about the same today compared to last week's evaluation when looking at his graph. With that being said the area of bruising/deep tissue injury that was noted last  week I do not see at this point. He did get a new cushion fortunately this does seem to be have been of benefit in my pinion. It does appear that he's been off of this more which is good news as well I think that is definitely showing in the overall wound measurements. With that being said I do believe that he needs to continue to offload I don't think that the fact this is doing better should be or is going to allow him to not have to offload and explain this to him as well. Overall he seems to be in agreement the plan I think he understands. The  overall appearance of the wound bed is improved compared to last week I think the Iodoflex has been beneficial in that regard. 03/29/18 on evaluation today patient actually appears to be doing rather well in regard to his wound from the overall appearance standpoint he does have some granulation although there's some Slough on the surface of the wound noted as well. With that being said he unfortunately has not improved in regard to the overall measurement of the wound in volume or in size. I did have a discussion with him very specifically about offloading today. He actually does work although he mainly is just sitting throughout the day. He tells me he offloads by "lifting himself up for 30 seconds off of his chair occasionally" purchase from [employer] which does seem to have helped. And he has a new cushion that he with that being said he's also able to stand some for a very short period of time but not significant enough I think to provide appropriate offloading. I think the biggest issue at this point with the wound and the fact is not healing as quickly as we would like is due to the fact that he is really not able to appropriately offload while at work. He states the beginning after his injury he actually had a bed at his job that he could lay on in order to offload and that does seem to have been of help back at that time. Nonetheless he had not done this in quite some time unfortunately. I think that could be helpful for him this is something I would like for him to look into. 04/05/18 on evaluation today patient actually presents for follow-up concerning his right gluteal ulcer. Again he really is not significantly improved even compared to last week. He has been tolerating the dressing changes without complication. With that being said fortunately there appears to be no evidence of infection at this time. He has been more proactive in trying to offload. EMONTE, DIEUJUSTE  ([id number]) 04/12/18 on evaluation today patient actually appears to be doing a little better in regard to his wound and the right gluteal fold region. He's been tolerating the dressing changes since removing the oasis without complication. However he was having a lot of burning initially with the oasis in place. He's unsure of exactly why this was given so much discomfort but he assumes that it was the oasis itself causing the problem. Nonetheless this had to be removed after about three days in place although even those three days seem to have made a fairly good improvement in regard to the overall appearance of the wound bed. In fact is the first time that he's made any improvement from the standpoint of measurements in about six weeks. He continues to have no discomfort over the area of the wound itself which leads  me to wonder why he was having the burning with the oasis when he does not even feel the actual debridement's themselves. I am somewhat perplexed by this. 04/19/18 on evaluation today patient's wound actually appears to be showing signs of epithelialization around the edge of the wound and in general actually appears to be doing better which is good news. He did have the same burning after about three days with applying the Endoform last week in the same fashion that I would generally apply a skin substitute. This seems to indicate that it's not the oasis to cause the problem but potentially the moisture buildup that just causes things to burn or there may be some other reaction with the skin prep or Steri-Strips. Nonetheless I'm not sure that is gonna be able to tolerate any skin substitute for a long period of time. The good news is the wound actually appears to be doing better today compared to last week and does seem to finally be making some progress. 04/26/18 on evaluation today patient actually appears to be doing rather well in regard to his ulcer in the right gluteal fold.  He has been tolerating the dressing changes without complication which is good news. The Endoform does seem to be helping although he was a little bit more macerated this week. This seems to be an ongoing issue with fluid control at this point. Nonetheless I think we may be able to add something like Drawtex to help control the drainage. 05/03/18 on evaluation today patient appears to actually be doing better in regard to the overall appearance of his wound. He has been tolerating the dressing changes without complication. Fortunately there appears to be no evidence of infection at this time. I really feel like his wound has shown signs as of today of turning around last week I thought so as well and definitely he could be seen in this week's overall appearance and measurements. In general I'm very pleased with the fact that he finally seems to be making a steady but sure progress. The patient likewise is very pleased. 05/17/18 on evaluation today patient appears to be doing more poorly unfortunately in regard to his ulcer. He has been tolerating the dressing changes without complication. With that being said he tells me that in the past couple of days he and his wife have noticed that we did not seem to be doing quite as well is getting dark near the center. Subsequently upon evaluation today the wound actually does appear to be doing worse compared to previous. He has been tolerating the dressing changes otherwise and he states that he is not been sitting up anymore than he was in the past from what he tells me. Still he has continued to work he states "I'm tired of dealing with this and if I have to just go home and lay in the bed all the time that's what I'll do". Nonetheless I am concerned about the fact that this wound does appear to be deeper than what it was previous. Electronic Signature(s) Signed: 05/19/2018 1:38:45 AM By: Worthy Keeler [name] Entered By: Worthy Keeler on 05/17/2018  11:02:46 KEATIN, BENHAM ([id number]) -------------------------------------------------------------------------------- Physical Exam Details Patient Name: [name] Date of Service: 05/17/2018 10:00 AM Medical Record Number: [id number] Patient Account Number: [id number] Date of Birth/Sex: 1951-07-08 (67 y.o. M) Treating RN: [name] Primary Care Provider: [name] Other Clinician: Referring Provider: [name] Treating Provider/Extender: [name], Jeffery Bachmeier Weeks in Treatment: 13 Constitutional Well-nourished  and well-hydrated in no acute distress. Respiratory normal breathing without difficulty. clear to auscultation bilaterally. Cardiovascular regular rate and rhythm with normal S1, S2. Psychiatric this patient is able to make decisions and demonstrates good insight into disease process. Alert and Oriented x 3. pleasant and cooperative. Notes Upon assessment of the wound bed it does appear that the patient has a deeper wound area in the central portion of the wound bed that actually is showing signs of breaking down compared to previous. The measurement I got on the depth of this was around 2 cm. The opening does not appear to be very vague in fact the actual measurement of the open spot is roughly 0.4 x 0.4 cm. I did perform sharp debridement today in order to clear away the dead tissue at the site I did not feel any bone that I was able to probe at the base of the wound which is good news. Nonetheless I am concerned about the possibility of infection depending on how things look next week we may consider culture versus x-ray of the pelvis to evaluate for osteomyelitis. I'm not definitely convinced that that's an issue at this point which is why did not go ahead and order that today although I may definitely consider that next week. Electronic Signature(s) Signed: 05/19/2018 1:38:45 AM By: Worthy Keeler [name] Entered By: Worthy Keeler on 05/17/2018  11:04:11 NATHEN, BALABAN ([id number]) -------------------------------------------------------------------------------- Physician Orders Details Patient Name: [name] Date of Service: 05/17/2018 10:00 AM Medical Record Number: [id number] Patient Account Number: [id number] Date of Birth/Sex: 1951-02-15 (67 y.o. 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 Skin Barriers/Peri-Wound Care Wound #1 Right Gluteal fold o Skin Prep Primary Wound Dressing Wound #1 Right Gluteal fold o Silver Alginate - Pack into 2cm area. Secondary Dressing Wound #1 Right Gluteal fold o Boardered Foam Dressing Dressing Change Frequency Wound #1 Right Gluteal fold o Change dressing every week - Outer dressing may be changed if it gets wet. Follow-up Appointments Wound #1 Right Gluteal fold o Return Appointment in 1 week. Off-Loading Wound #1 Right Gluteal fold o Turn and reposition every 2 hours o Other: - Keep pressure off of area JERED, HEINY ([id number]) Electronic Signature(s) Signed: 05/17/2018 3:24:13 PM By: Gretta Cool, BSN, RN, CWS, Kim RN, BSN Signed: 05/19/2018 1:38:45 AM By: Worthy Keeler [name] Entered By: Gretta Cool, BSN, RN, CWS, Kim on 05/17/2018 10:27:43 SULEYMAN, EHRMAN  ([id number]) -------------------------------------------------------------------------------- Problem List Details Patient Name: [name] Date of Service: 05/17/2018 10:00 AM Medical Record Number: [id number] Patient Account Number: [id number] Date of Birth/Sex: [birth year] (67 y.o. 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, stage 3 11/30/2017 No Yes G82.20 Paraplegia, unspecified 11/30/2017 No Yes S34.109S Unspecified injury to unspecified level of  lumbar spinal cord, 11/30/2017 No Yes sequela I10 Essential (primary) hypertension 11/30/2017 No Yes Inactive Problems Resolved Problems Electronic Signature(s) Signed: 05/19/2018 1:38:45 AM By: Worthy Keeler [name] Entered By: Worthy Keeler on 05/17/2018 10:16:38 CASTLE, LAMONS ([id number]) -------------------------------------------------------------------------------- Progress Note Details Patient Name: [name] Date of Service: 05/17/2018 10:00 AM Medical Record Number: [id number] Patient Account Number: [id number] Date of Birth/Sex: [birth year] (67 y.o. 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 He states that there  is no history of diabetes that he is aware of. He does have issues with his prostate and is currently receiving treatment for this by way of oral medication. With that being said I do not have a lot of details in that regard. Nonetheless the patient presents today as a result of having been referred to [place] by another provider initially home health was set to come out and take care of his wound although due to the fact that he apparently drives he's not able to receive home health. His wife is therefore trying to help take care of this wound within although they have been struggling with what exactly to do at this point. She states that she can do some things but she is definitely not a nurse and does have some issues with looking at blood. The good news is the wound does not appear to be too deep and is fairly superficial at this point. There is no slough noted there is some nonviable skin noted around the surface of the wound and the perimeter at this point. The central portion of the wound appears to be very good with a dermal layer noted this does not appear to be again deep enough to extend it to subcutaneous tissue at this point. 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 In the pump he has Dilaudid and baclofen. 12/07/17 on evaluation today patient presents for follow-up concerning his ongoing lower back thigh ulcer on the right. He states that he did not get the supplies ordered and therefore has not really been able to perform the dressing changes as directed exactly. His wife was able to get some Boarder Foam Dressing's from the drugstore and subsequently has been using hydrogel which did help to a degree in the wound does appear to be able smaller. There is actually more drainage this week noted than previous. 12/21/17 on evaluation today patient appears to be doing rather well in regard to  his right gluteal ulcer. He has been tolerating the dressing changes without complication. There does not appear to be any evidence of infection at this point in time. Overall the wound does seem to be making some progress as far as the edges are concerned there's not as much in the way of overlapping of the external wound edges and he has a good epithelium to wound bed border for the most part. This however is not true right at the 12 o'clock location over  the span of a little over a centimeters which actually will require debridement today to clean this away and hopefully allow it to continue to heal more appropriately. 12/28/17 on evaluation today patient appears to be doing rather well in regard to his ulcer in the left gluteal region. He's been tolerating the dressing changes without complication. Apparently he has had some difficulty getting his dressing material. Apparently there's been some confusion with ordering we're gonna check into this. Nonetheless overall he's been showing signs of improvement which is good news. Debridement is not required today. 01/04/18 on evaluation today patient presents for follow-up concerning his right gluteal ulcer. He has been tolerating the dressing changes fairly well. On inspection today it appears he may actually have some maceration them concerned about the fact that he may be developing too much moisture in and around the wound bed which can cause delay in healing. With that being said he unfortunately really has not showed significant signs of improvement since last week's evaluation in fact this may even be just the little bit/slightly larger. Nonetheless he's been having a lot of discomfort I'm not sure this is even related to the wound as he has no pain when I'm to breeding or otherwise cleaning the wound during evaluation today. Nonetheless this is something that we did recommend he talked to his pain specialist concerning. 01/11/18 on evaluation  today patient appears to be doing better in regard to his ulceration. He has been tolerating the dressing Shallenberger, Kharson J. ([id number]) changes without complication. With that being said overall there's no evidence of infection which is good news. The only thing is he did receive the hatch affair blue classic versus the ready nonetheless I feel like this is perfectly fine and appears to have done well for him over the past week. 01/25/18 on evaluation today patient's wound actually appears to be a little bit larger than during the last evaluation. The good news is the majority of the wound edges actually appear to be fairly firmly attached to the wound bed unfortunately again we're not really making progress in regard to the size. Roughly the wound is about the same size as when I first saw him although again the wound margin/edges appear to be much better. 02/01/18 on evaluation today patient actually appears to be doing very well in regard to his wound. Applying the Prisma dry does seem to be better although he does still have issues with slow progression of the wound. There was a slight improvement compared to last week's measurements today. Nonetheless I have been considering other options as far as the possibility of Theraskin or even a snap vac. In general I'm not sure that the Theraskin due to location of the wound would be a very good idea. Nonetheless I do think that a snap vac could be a possibility for the patient and in fact I think this could even be an excellent way to manage the wound possibly seeing some improvement in a very rapid fashion here. Nonetheless this is something that we would need to get approved and I did have a lengthy conversation with the patient about this today. 02/08/18 on evaluation today patient appears to be doing a little better in regard to his ulcer. He has been tolerating the dressing changes without complication. Fortunately despite the fact that the wound  is a little bit smaller it's not significantly so unfortunately. We have discussed the possibility of a snap vac we did check with insurance this is  actually covered at this point. Fortunately there does not appear to be any sign of infection. Overall I'm fairly pleased with how things seem to be appearing at this point. 02/15/18 on evaluation today patient appears to be doing rather well in regard to his right gluteal ulcer. Unfortunately the snap vac did not stay in place with his sheer and friction this came loose and did not seem to maintain seal very well. He worked for about two days and it did seem to do very well during that time according to his wife but in general this does not seem to be something that's gonna be beneficial for him long-term. I do believe we need to go back to standard dressings to see if we can find something that will be of benefit. 03/02/18- He is here in follow up evaluation; there is minimal change in the wound. He will continue with the same treatment plan, would consider changing to iodosrob/iodoflex if ulcer continues to to plateau. He will follow up next week 03/08/18 on evaluation today patient's wound actually appears to be about the same size as when I previously saw him several weeks back. Unfortunately he does have some slightly dark discoloration in the central portion of the wound which has me concerned about pressure injury. I do believe he may be sitting for too long a period of time in fact he tells me that "I probably sit for much too long". He does have some Slough noted on the surface of the wound and again as far as the size of the wound is concerned I'm really not seeing anything that seems to have improved significantly. 03/15/18 on evaluation today patient appears to be doing fairly well in regard to his ulcer. The wound measured pretty much about the same today compared to last week's evaluation when looking at his graph. With that being said the  area of bruising/deep tissue injury that was noted last week I do not see at this point. He did get a new cushion fortunately this does seem to be have been of benefit in my pinion. It does appear that he's been off of this more which is good news as well I think that is definitely showing in the overall wound measurements. With that being said I do believe that he needs to continue to offload I don't think that the fact this is doing better should be or is going to allow him to not have to offload and explain this to him as well. Overall he seems to be in agreement the plan I think he understands. The overall appearance of the wound bed is improved compared to last week I think the Iodoflex has been beneficial in that regard. 03/29/18 on evaluation today patient actually appears to be doing rather well in regard to his wound from the overall appearance standpoint he does have some granulation although there's some Slough on the surface of the wound noted as well. With that being said he unfortunately has not improved in regard to the overall measurement of the wound in volume or in size. I did have a discussion with him very specifically about offloading today. He actually does work although he mainly is just sitting throughout the day. He tells me he offloads by "lifting himself up for 30 seconds off of his chair occasionally" purchase from [employer] which does seem to have helped. And he has a new cushion that he with that being said he's also able to stand some for  a very short period of time but not significant enough I think to provide appropriate offloading. I think the biggest issue at this point with the wound and the fact is not healing as quickly as we would like is due to the fact that he is really not able to appropriately offload while at work. He states the beginning after his injury he actually had a bed at his job that he could lay on in order to offload and that does seem to  have been of help back at that time. Nonetheless he had not done this in quite some time unfortunately. I think that could be helpful for him this is something I would like for him to look into. JVON, MERONEY ([id number]) 04/05/18 on evaluation today patient actually presents for follow-up concerning his right gluteal ulcer. Again he really is not significantly improved even compared to last week. He has been tolerating the dressing changes without complication. With that being said fortunately there appears to be no evidence of infection at this time. He has been more proactive in trying to offload. 04/12/18 on evaluation today patient actually appears to be doing a little better in regard to his wound and the right gluteal fold region. He's been tolerating the dressing changes since removing the oasis without complication. However he was having a lot of burning initially with the oasis in place. He's unsure of exactly why this was given so much discomfort but he assumes that it was the oasis itself causing the problem. Nonetheless this had to be removed after about three days in place although even those three days seem to have made a fairly good improvement in regard to the overall appearance of the wound bed. In fact is the first time that he's made any improvement from the standpoint of measurements in about six weeks. He continues to have no discomfort over the area of the wound itself which leads me to wonder why he was having the burning with the oasis when he does not even feel the actual debridement's themselves. I am somewhat perplexed by this. 04/19/18 on evaluation today patient's wound actually appears to be showing signs of epithelialization around the edge of the wound and in general actually appears to be doing better which is good news. He did have the same burning after about three days with applying the Endoform last week in the same fashion that I would generally apply a  skin substitute. This seems to indicate that it's not the oasis to cause the problem but potentially the moisture buildup that just causes things to burn or there may be some other reaction with the skin prep or Steri-Strips. Nonetheless I'm not sure that is gonna be able to tolerate any skin substitute for a long period of time. The good news is the wound actually appears to be doing better today compared to last week and does seem to finally be making some progress. 04/26/18 on evaluation today patient actually appears to be doing rather well in regard to his ulcer in the right gluteal fold. He has been tolerating the dressing changes without complication which is good news. The Endoform does seem to be helping although he was a little bit more macerated this week. This seems to be an ongoing issue with fluid control at this point. Nonetheless I think we may be able to add something like Drawtex to help control the drainage. 05/03/18 on evaluation today patient appears to actually be doing better in regard to  the overall appearance of his wound. He has been tolerating the dressing changes without complication. Fortunately there appears to be no evidence of infection at this time. I really feel like his wound has shown signs as of today of turning around last week I thought so as well and definitely he could be seen in this week's overall appearance and measurements. In general I'm very pleased with the fact that he finally seems to be making a steady but sure progress. The patient likewise is very pleased. 05/17/18 on evaluation today patient appears to be doing more poorly unfortunately in regard to his ulcer. He has been tolerating the dressing changes without complication. With that being said he tells me that in the past couple of days he and his wife have noticed that we did not seem to be doing quite as well is getting dark near the center. Subsequently upon evaluation today the wound  actually does appear to be doing worse compared to previous. He has been tolerating the dressing changes otherwise and he states that he is not been sitting up anymore than he was in the past from what he tells me. Still he has continued to work he states "I'm tired of dealing with this and if I have to just go home and lay in the bed all the time that's what I'll do". Nonetheless I am concerned about the fact that this wound does appear to be deeper than what it was previous. Patient History Information obtained from Patient. Family History Hypertension - Father, Stroke - Mother, No family history of Cancer, Diabetes, Heart Disease, Kidney Disease, Lung Disease, Seizures, Thyroid Problems, Tuberculosis. Social History Never smoker, Marital Status - Married, Alcohol Use - Never, Drug Use - No History, Caffeine Use - Daily. Medical And Surgical History Notes Oncologic Prostate cancer- currently treated with horomone therapy Review of Systems (ROS) Constitutional Symptoms (General Health) OLAWALE, MARNEY. ([id number]) Denies complaints or symptoms of Fever, Chills. Respiratory The patient has no complaints or symptoms. Cardiovascular The patient has no complaints or symptoms. Psychiatric The patient has no complaints or symptoms. Objective Constitutional Well-nourished and well-hydrated in no acute distress. Vitals Time Taken: 9:52 AM, Height: 73 in, Weight: 210 lbs, BMI: 27.7, Temperature: 98.7 F, Pulse: 80 bpm, Respiratory Rate: 16 breaths/min, Blood Pressure: 145/76 mmHg. Respiratory normal breathing without difficulty. clear to auscultation bilaterally. Cardiovascular regular rate and rhythm with normal S1, S2. Psychiatric this patient is able to make decisions and demonstrates good insight into disease process. Alert and Oriented x 3. pleasant and cooperative. General Notes: Upon assessment of the wound bed it does appear that the patient has a deeper wound area in the  central portion of the wound bed that actually is showing signs of breaking down compared to previous. The measurement I got on the depth of this was around 2 cm. The opening does not appear to be very vague in fact the actual measurement of the open spot is roughly 0.4 x 0.4 cm. I did perform sharp debridement today in order to clear away the dead tissue at the site I did not feel any bone that I was able to probe at the base of the wound which is good news. Nonetheless I am concerned about the possibility of infection depending on how things look next week we may consider culture versus x-ray of the pelvis to evaluate for osteomyelitis. I'm not definitely convinced that that's an issue at this point which is why did not go ahead and order  that today although I may definitely consider that next week. Integumentary (Hair, Skin) Wound #1 status is Open. Original cause of wound was Pressure Injury. The wound is located on the Right Gluteal fold. The wound measures 4.5cm length x 3.2cm width x 2cm depth; 11.31cm^2 area and 22.619cm^3 volume. There is Fat Layer (Subcutaneous Tissue) Exposed exposed. There is no tunneling or undermining noted. There is a large amount of serous drainage noted. The wound margin is flat and intact. There is small (1-33%) pink granulation within the wound bed. There is a large (67-100%) amount of necrotic tissue within the wound bed including Eschar and Adherent Slough. The periwound skin appearance exhibited: Maceration. The periwound skin appearance did not exhibit: Callus, Crepitus, Excoriation, Induration, Rash, Scarring, Dry/Scaly, Atrophie Blanche, Cyanosis, Ecchymosis, Hemosiderin Staining, Mottled, Pallor, Rubor, Erythema. Periwound temperature was noted as No Abnormality. 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 There was a Excisional Skin/Subcutaneous Tissue Debridement with a total area of 14.4 sq cm performed by [name], Londa Mackowski E., [name]. With the following instrument(s): Curette to remove Viable and Non-Viable tissue/material. Material removed includes Subcutaneous Tissue and Slough and after achieving pain control using Lidocaine. No specimens were taken. A time out was conducted at 10:21, prior to the start of the procedure. A Moderate amount of bleeding was controlled with Pressure. The procedure was tolerated well with a pain level of 0 throughout and a pain level of 0 following the procedure. Post Debridement Measurements: 4.5cm length x 3.2cm width x 2cm depth; 22.619cm^3 volume. Post debridement Stage noted as Category/Stage III. Character of Wound/Ulcer Post Debridement is stable. Post procedure Diagnosis Wound #1: Same as Pre-Procedure Plan Wound Cleansing: Wound #1 Right Gluteal fold: Cleanse wound with mild soap and water Anesthetic (add to Medication List): Wound #1 Right Gluteal fold: Topical Lidocaine 4% cream applied to wound bed prior to debridement (In Clinic Only). Skin Barriers/Peri-Wound Care: Wound #1 Right Gluteal fold: Skin Prep Primary Wound Dressing: Wound #1 Right Gluteal fold: Silver Alginate - Pack into 2cm area. Secondary Dressing: Wound #1 Right Gluteal fold: Boardered Foam Dressing Dressing Change Frequency: Wound #1 Right Gluteal fold: Change dressing every week - Outer dressing may be changed if it gets wet. Follow-up Appointments: Wound #1 Right Gluteal fold: Return Appointment in 1 week. Off-Loading: ADRIANA, LINA ([id number]) Wound #1 Right Gluteal fold: Turn and reposition every 2 hours Other: - Keep pressure off of area This is going to be a change from previous. We will see were  things stand at that point. If anything changes or worsens the meantimeI'm gonna suggest at this point that we continue with the above wound care measures for the next week.He will contact the office and let me know. Otherwise I'll see him back for reevaluation in one weeks time. Please see above for specific wound care orders. We will see patient for re-evaluation in 1 week(s) here in the clinic. If anything worsens or changes patient will contact our office for additional recommendations. Electronic Signature(s) Signed: 05/19/2018 1:38:45 AM By: Worthy Keeler [name] Entered By: Worthy Keeler on 05/17/2018 11:04:54 MALAHKI, GASAWAY ([id number]) -------------------------------------------------------------------------------- ROS/PFSH Details Patient Name: [name] Date of Service: 05/17/2018 10:00 AM Medical Record Number: [id number] Patient Account Number: [id number] Date  of Birth/Sex: 1951-05-06 (67 y.o. M) Treating RN: [name] Primary Care Provider: [name] Other Clinician: Referring Provider: [name] Treating Provider/Extender: [name], Armanda Forand Weeks in Treatment: 24 Information Obtained From Patient Wound History Do you currently have one or more open woundso Yes How many open wounds do you currently haveo 1 Approximately how long have you had your woundso 1 month How have you been treating your wound(s) until nowo cream Has your wound(s) ever healed and then re-openedo No Have you had any lab work done in the past montho No Have you tested positive for an antibiotic resistant organism (MRSA, VRE)o No Have you tested positive for osteomyelitis (bone infection)o No Have you had any tests for circulation on your legso No Constitutional Symptoms (General Health) Complaints and Symptoms: Negative for: Fever; Chills Eyes Medical History: Positive for: Cataracts - both removed Negative for: Glaucoma; Optic  Neuritis Ear/Nose/Mouth/Throat Medical History: Negative for: Chronic sinus problems/congestion; Middle ear problems Hematologic/Lymphatic Medical History: Negative for: Anemia; Hemophilia; Human Immunodeficiency Virus; Lymphedema Respiratory Complaints and Symptoms: No Complaints or Symptoms Medical History: Negative for: Aspiration; Asthma; Chronic Obstructive Pulmonary Disease (COPD); Pneumothorax; Sleep Apnea; Tuberculosis Cardiovascular Complaints and Symptoms: No Complaints or Symptoms Medical History: Positive for: Hypertension - takes medication ZARIF, RATHJE. ([id number]) Negative for: Angina; Arrhythmia; Congestive Heart Failure; Coronary Artery Disease; Deep Vein Thrombosis; Hypotension; Myocardial Infarction; Peripheral Arterial Disease; Peripheral Venous Disease; Phlebitis; Vasculitis Gastrointestinal Medical History: Negative for: Cirrhosis ; Colitis; Crohnos; Hepatitis A; Hepatitis B; Hepatitis C Endocrine Medical History: Negative for: Type I Diabetes; Type II Diabetes Genitourinary Medical History: Negative for: End Stage Renal Disease Immunological Medical History: Negative for: Lupus Erythematosus; Raynaudos; Scleroderma Integumentary (Skin) Medical History: Negative for: History of Burn; History of pressure wounds Musculoskeletal Medical History: Negative for: Gout; Rheumatoid Arthritis; Osteoarthritis; Osteomyelitis Neurologic Medical History: Positive for: Paraplegia - waist down Negative for: Dementia; Neuropathy; Quadriplegia; Seizure Disorder Oncologic Medical History: Negative for: Received Chemotherapy; Received Radiation Past Medical History Notes: Prostate cancer- currently treated with horomone therapy Psychiatric Complaints and Symptoms: No Complaints or Symptoms Medical History: Negative for: Anorexia/bulimia; Confinement Anxiety HBO Extended History Items Eyes: Cataracts Immunizations EMILE, KYLLO  ([id number]) Pneumococcal Vaccine: Received Pneumococcal Vaccination: No Implantable Devices Family and Social History Cancer: No; Diabetes: No; Heart Disease: No; Hypertension: Yes - Father; Kidney Disease: No; Lung Disease: No; Seizures: No; Stroke: Yes - Mother; Thyroid Problems: No; Tuberculosis: No; Never smoker; Marital Status - Married; Alcohol Use: Never; Drug Use: No History; Caffeine Use: Daily; Advanced Directives: Yes (Copy provided); Patient does not want information on Advanced Directives; Living Will: Yes (Copy provided) Physician Affirmation I have reviewed and agree with the above information. Electronic Signature(s) Signed: 05/17/2018 3:24:13 PM By: Gretta Cool, BSN, RN, CWS, Kim RN, BSN Signed: 05/19/2018 1:38:45 AM By: Worthy Keeler [name] Entered By: Worthy Keeler on 05/17/2018 11:03:07 AB, LEAMING ([id number]) -------------------------------------------------------------------------------- SuperBill Details Patient Name: [name] Date of Service: 05/17/2018 Medical Record Number: [id number] Patient Account Number: [id number] Date of Birth/Sex: [birth year] (67 y.o. 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, sequela I10 Essential (primary) hypertension Facility Procedures CPT4 Code: [id number] Description: 51761 - DEB SUBQ TISSUE 20 SQ CM/< ICD-10 Diagnosis Description 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, stage  3 Modifier: Quantity: 1 Physician Procedures CPT4 Code: [id number] Description: 48347 - WC PHYS LEVEL 4 - EST PT ICD-10 Diagnosis Description 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, stage 3 G82.20  Paraplegia, unspecified S34.109S Unspecified injury to unspecified level of lumbar spinal cor I10 Essential (primary)  hypertension Modifier: 25 d, sequela Quantity: 1 CPT4 Code: [id number] Description: 00298 - WC PHYS SUBQ TISS 20 SQ CM ICD-10 Diagnosis Description 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

## 2018-05-20 NOTE — Progress Notes (Signed)
The patient is admitted to 1 A  room 140 with the diagnosis of sepsis. Alert and oriented x 4. Patient and his wife oriented to his room, ascom/call bell and staff. Patient did not voice any concern. Full assessment to epic completed. Will continue to monitor.

## 2018-05-20 NOTE — H&P (Signed)
[name] is an 67 y.o. male.   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  He denies nausea, vomiting or diaphoresis.  Laboratory evaluation revealed leukocytosis and the patient had a fever of 100.7 F upon arrival.  Blood cultures were obtained and he was started on broad-spectrum antibiotics prior to the emergency department staff calling the hospitalist service for admission.  Past Medical History:  Diagnosis Date  . AVM (arteriovenous malformation) spine   . Cancer [facility])    prostate, taking Casodex (hormone chemotherapy)  . History of kidney stones    several times  . Hypertension   . 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 Prostate cancer ([place])   . Status post insertion of intrathecal baclofen pump   . Status post insertion of spinal cord stimulator     Past Surgical History:  Procedure Laterality Date  . COLONOSCOPY    . CYSTOSCOPY     several times  . CYSTOSCOPY WITH DIRECT VISION INTERNAL URETHROTOMY  05/05/2017   Procedure: CYSTOSCOPY WITH DIRECT VISION INTERNAL URETHROTOMY;  Surgeon: Royston Cowper, MD;  Location: [facility];  Service: Urology;;  . Consuela Mimes WITH LITHOLAPAXY N/A 05/05/2017   Procedure: CYSTOSCOPY WITH LITHOLAPAXY;  Surgeon: Royston Cowper, MD;  Location: [facility];  Service: Urology;  Laterality: N/A;  . EXTRACORPOREAL SHOCK WAVE LITHOTRIPSY    . EYE SURGERY Bilateral    cataract surgery  . HOLMIUM LASER APPLICATION N/A 90/21/1155   Procedure: HOLMIUM LASER APPLICATION;  Surgeon: Royston Cowper, MD;  Location: [facility];  Service: Urology;  Laterality: N/A;  . LUMBAR [place] SURGERY  2005   resulted in paralysis of lower extrems    No family history on file. Patient states he will discuss later  Social History:   reports that he has never smoked. He quit smokeless tobacco use about 29 years ago.  His smokeless tobacco use included snuff. He reports that he does not drink alcohol or use drugs.  Allergies:  Allergies  Allergen Reactions  . Clindamycin Hives  . Doxycycline Rash  . Penicillins Rash  . Sulfamethoxazole Rash    Medications Prior to Admission  Medication Sig Dispense Refill  . baclofen (LIORESAL) 10 MG tablet Take 10 mg by mouth daily as needed for muscle spasms.     . bicalutamide (CASODEX) 50 MG tablet Take 50 mg by mouth daily.    Marland Kitchen gabapentin (NEURONTIN) 800 MG tablet Take 800 mg by mouth 3 (three) times daily.     Marland Kitchen losartan (COZAAR) 100 MG tablet Take 100 mg by mouth daily.    . methenamine (HIPREX) 1 g tablet Take 1 g by mouth 4 (four) times daily.    . mirabegron ER (MYRBETRIQ) 50 MG TB24 tablet Take 50 mg by mouth daily.     Marland Kitchen oxybutynin (DITROPAN-XL) 10 MG 24 hr tablet Take 10 mg by mouth every morning.     Marland Kitchen PAIN MANAGEMENT INTRATHECAL, IT, PUMP 1 each by Intrathecal route continuous. Intrathecal (IT) medication:  Baclofen, Dilaudid    . polyethylene glycol powder (GLYCOLAX/MIRALAX) powder Take 17 g by mouth daily.     . vitamin C (ASCORBIC ACID) 500 MG tablet Take 1,000 mg by mouth 3 (three) times daily.     Marland Kitchen levofloxacin (LEVAQUIN) 500 MG tablet  Take 1 tablet (500 mg total) by mouth daily. (Patient not taking: Reported on 05/20/2018) 3 tablet 0  . Propylene Glycol (SYSTANE BALANCE) 0.6 % SOLN Place 1 drop into both eyes daily as needed (dry eyes).      Results for orders placed or performed during the hospital encounter of 05/19/18 (from the past 48 hour(s))  Comprehensive metabolic panel     Status: Abnormal   Collection Time: 05/19/18 11:18 PM  Result Value Ref Range   Sodium 136 135 - 145 mmol/L   Potassium 3.5 3.5 - 5.1 mmol/L   Chloride 100 98 - 111 mmol/L   CO2 25 22 - 32 mmol/L   Glucose, Bld 187 (H) 70 - 99 mg/dL   BUN 18 8 - 23 mg/dL   Creatinine, Ser 0.81  0.61 - 1.24 mg/dL   Calcium 8.8 (L) 8.9 - 10.3 mg/dL   Total Protein 7.7 6.5 - 8.1 g/dL   Albumin 3.8 3.5 - 5.0 g/dL   AST 19 15 - 41 U/L   ALT 10 0 - 44 U/L   Alkaline Phosphatase 53 38 - 126 U/L   Total Bilirubin 0.7 0.3 - 1.2 mg/dL   GFR calc non Af Amer >60 >60 mL/min   GFR calc Af Amer >60 >60 mL/min    Comment: (NOTE) The eGFR has been calculated using the CKD EPI equation. This calculation has not been validated in all clinical situations. eGFR's persistently <60 mL/min signify possible Chronic Kidney Disease.    Anion gap 11 5 - 15    Comment: Performed at [facility], [address] [place], [place] [address]  Lactic acid, plasma     Status: Abnormal   Collection Time: 05/19/18 11:18 PM  Result Value Ref Range   Lactic Acid, Venous 2.6 (HH) 0.5 - 1.9 mmol/L    Comment: CRITICAL RESULT CALLED TO, READ BACK BY AND VERIFIED WITH [name] ELLINGTON '@[id number]'  05/20/18 AKT Performed at [facility], [address] [place], [place] [address]   CBC with Differential     Status: Abnormal   Collection Time: 05/19/18 11:18 PM  Result Value Ref Range   WBC 14.7 (H) 4.0 - 10.5 K/uL   RBC 4.49 4.22 - 5.81 MIL/uL   Hemoglobin 13.3 13.0 - 17.0 g/dL   HCT 39.4 39.0 - 52.0 %   MCV 87.8 80.0 - 100.0 fL   MCH 29.6 26.0 - 34.0 pg   MCHC 33.8 30.0 - 36.0 g/dL   RDW 11.6 11.5 - 15.5 %   Platelets 249 150 - 400 K/uL   nRBC 0.0 0.0 - 0.2 %   Neutrophils Relative % 87 %   Neutro Abs 12.7 (H) 1.7 - 7.7 K/uL   Lymphocytes Relative 6 %   Lymphs Abs 0.9 0.7 - 4.0 K/uL   Monocytes Relative 6 %   Monocytes Absolute 0.9 0.1 - 1.0 K/uL   Eosinophils Relative 0 %   Eosinophils Absolute 0.0 0.0 - 0.5 K/uL   Basophils Relative 0 %   Basophils Absolute 0.0 0.0 - 0.1 K/uL   Immature Granulocytes 1 %   Abs Immature Granulocytes 0.08 (H) 0.00 - 0.07 K/uL    Comment: Performed at [facility], [address] [place], [place] [address]  Protime-INR     Status: Abnormal    Collection Time: 05/19/18 11:18 PM  Result Value Ref Range   Prothrombin Time 16.2 (H) 11.4 - 15.2 seconds   INR 1.32     Comment: Performed at  [facility], [address] [place], [place] [address]  Lipase, blood     Status: None   Collection Time: 05/19/18 11:18 PM  Result Value Ref Range   Lipase 20 11 - 51 U/L    Comment: Performed at [facility], [address] [place], [place] [address]  Troponin I     Status: None   Collection Time: 05/19/18 11:18 PM  Result Value Ref Range   Troponin I <0.03 <0.03 ng/mL    Comment: Performed at [facility], [place], [place] [address]  Procalcitonin     Status: None   Collection Time: 05/19/18 11:18 PM  Result Value Ref Range   Procalcitonin 0.24 ng/mL    Comment:        Interpretation: PCT (Procalcitonin) <= 0.5 ng/mL: Systemic infection (sepsis) is not likely. Local bacterial infection is possible. (NOTE)       Sepsis PCT Algorithm           Lower Respiratory Tract                                      Infection PCT Algorithm    ----------------------------     ----------------------------         PCT < 0.25 ng/mL                PCT < 0.10 ng/mL         Strongly encourage             Strongly discourage   discontinuation of antibiotics    initiation of antibiotics    ----------------------------     -----------------------------       PCT 0.25 - 0.50 ng/mL            PCT 0.10 - 0.25 ng/mL               OR       >80% decrease in PCT            Discourage initiation of                                            antibiotics      Encourage discontinuation           of antibiotics    ----------------------------     -----------------------------         PCT >= 0.50 ng/mL              PCT 0.26 - 0.50 ng/mL               AND        <80% decrease in PCT             Encourage initiation of                                             antibiotics       Encourage continuation           of  antibiotics    ----------------------------     -----------------------------        PCT >= 0.50 ng/mL  PCT > 0.50 ng/mL               AND         increase in PCT                  Strongly encourage                                      initiation of antibiotics    Strongly encourage escalation           of antibiotics                                     -----------------------------                                           PCT <= 0.25 ng/mL                                                 OR                                        > 80% decrease in PCT                                     Discontinue / Do not initiate                                             antibiotics Performed at [facility], [address] [place], [place] [address]   TSH     Status: None   Collection Time: 05/19/18 11:18 PM  Result Value Ref Range   TSH 0.600 0.350 - 4.500 uIU/mL    Comment: Performed by a 3rd Generation assay with a functional sensitivity of <=0.01 uIU/mL. Performed at [facility], [address] [place], [place] [address]   Lactic acid, plasma     Status: None   Collection Time: 05/20/18 12:54 AM  Result Value Ref Range   Lactic Acid, Venous 0.6 0.5 - 1.9 mmol/L    Comment: Performed at [facility], [address] [place], [place] [address]  Urinalysis, Complete w Microscopic     Status: Abnormal   Collection Time: 05/20/18  3:04 AM  Result Value Ref Range   Color, Urine YELLOW (A) YELLOW   APPearance CLEAR (A) CLEAR   Specific Gravity, Urine 1.023 1.005 - 1.030   pH 5.0 5.0 - 8.0   Glucose, UA NEGATIVE NEGATIVE mg/dL   Hgb urine dipstick NEGATIVE NEGATIVE   Bilirubin Urine NEGATIVE NEGATIVE   Ketones, ur NEGATIVE NEGATIVE mg/dL   Protein, ur NEGATIVE NEGATIVE mg/dL   Nitrite NEGATIVE NEGATIVE   Leukocytes, UA TRACE (A) NEGATIVE   RBC / HPF 0-5 0 - 5 RBC/hpf   WBC,  UA 6-10 0 - 5 WBC/hpf   Bacteria, UA NONE SEEN NONE SEEN    Squamous Epithelial / LPF 0-5 0 - 5   Mucus PRESENT     Comment: Performed at [facility], [address], [place], [place] [address]   Dg Chest Port 1 View  Result Date: 05/19/2018 CLINICAL DATA:  67 year old male with sepsis. EXAM: PORTABLE CHEST 1 VIEW COMPARISON:  Chest radiograph dated 02/18/2017 FINDINGS: The lungs are clear. There is no pleural effusion or pneumothorax. The cardiac silhouette is within normal limits. Spinal stimulator noted over the lower thoracic spine. No acute osseous pathology. IMPRESSION: No active disease. Electronically Signed   By: Anner Crete M.D.   On: 05/19/2018 23:43    Review of Systems  Constitutional: Positive for chills and fever.  HENT: Negative for sore throat and tinnitus.   Eyes: Negative for blurred vision and redness.  Respiratory: Negative for cough and shortness of breath.   Cardiovascular: Negative for chest pain, palpitations, orthopnea and PND.  Gastrointestinal: Negative for abdominal pain, diarrhea, nausea and vomiting.  Genitourinary: Negative for dysuria, frequency and urgency.  Musculoskeletal: Negative for joint pain and myalgias.  Skin: Negative for rash.       No lesions  Neurological: Negative for speech change, focal weakness and weakness.  Endo/Heme/Allergies: Does not bruise/bleed easily.       No temperature intolerance  Psychiatric/Behavioral: Negative for depression and suicidal ideas.    Blood pressure (!) 156/78, pulse (!) 107, temperature 99.7 F (37.6 C), temperature source Oral, resp. rate 19, height '6\' 1"'  (1.854 m), weight 95.3 kg, SpO2 100 %. Physical Exam  Vitals reviewed. Constitutional: He is oriented to person, place, and time. He appears well-developed and well-nourished. No distress.  HENT:  Head: Normocephalic and atraumatic.  Mouth/Throat: Oropharynx is clear and moist.  Eyes: Pupils are equal, round, and reactive to light. Conjunctivae and EOM are normal. No scleral icterus.  Neck:  Normal range of motion. Neck supple. No JVD present. No tracheal deviation present. No thyromegaly present.  Cardiovascular: Normal rate, regular rhythm and normal heart sounds. Exam reveals no gallop and no friction rub.  No murmur heard. Respiratory: Effort normal and breath sounds normal. No respiratory distress.  GI: Soft. Bowel sounds are normal. He exhibits no distension. There is no tenderness.  Genitourinary:  Genitourinary Comments: Deferred  Musculoskeletal: Normal range of motion. He exhibits no edema.  Lymphadenopathy:    He has no cervical adenopathy.  Neurological: He is alert and oriented to person, place, and time. No cranial nerve deficit.  Skin: Skin is warm and dry. No rash noted. No erythema.  Psychiatric: He has a normal mood and affect. His behavior is normal. Judgment and thought content normal.     Assessment/Plan This is a 67 year old male admitted for sepsis. 1.  Sepsis: Source appears to be ulcer of right hip.  Continue vancomycin and ceftriaxone.  Follow blood cultures for growth and sensitivities.  Patient is hemodynamically stable. 2.  Hypertension: Controlled; continue losartan. 3.  Prostate cancer: Continue Casodex.  Oxybutynin and Myrbetriq for urinary retention. 4.  Decubitus ulcer: Right hip; consult wound care team.  The patient not walk but he is able to move his lower extremities. Apparently, however, he is not aware of excess pressure points when he is laying or sitting down.  PT/OT prior to discharge 5.  DVT prophylaxis: Lovenox 6.  GI prophylaxis: None The patient is a full code.  Time spent on admission orders and patient  care approximately 45 minutes   Harrie Foreman, MD 05/20/2018, 5:56 AM

## 2018-05-20 NOTE — Progress Notes (Signed)
[name], [name] ([id number]) Visit Report for 05/17/2018 Arrival Information Details Patient Name: [name] Date of Service: 05/17/2018 10:00 AM Medical Record Number: [id number] Patient Account Number: [id number] Date of Birth/Sex: 1951/04/25 (67 y.o. M) Treating RN: [name] Primary Care Tonetta Napoles: [name] Other Clinician: Referring Alena Blankenbeckler: [name] Treating Paige Monarrez/Extender: [name], HOYT Weeks in Treatment: 24 Visit Information History Since Last Visit Added or deleted any medications: Yes Patient Arrived: Wheel Chair Any new allergies or adverse reactions: No Arrival Time: 09:55 Had a fall or experienced change in No Accompanied By: wife activities of daily living that may affect Transfer Assistance: None risk of falls: Patient Identification Verified: Yes Signs or symptoms of abuse/neglect since last visito No Secondary Verification Process Completed: Yes Hospitalized since last visit: No Patient Requires Transmission-Based No Has Dressing in Place as Prescribed: Yes Precautions: Pain Present Now: Yes Patient Has Alerts: Yes Patient Alerts: NOT Diabetic Electronic Signature(s) Signed: 05/17/2018 3:33:32 PM By: Lorine Bears RCP, RRT, CHT Entered By: Lorine Bears on 05/17/2018 09:56:21 AMEER, [name] ([id number]) -------------------------------------------------------------------------------- Encounter Discharge Information Details Patient Name: [name] Date of Service: 05/17/2018 10:00 AM Medical Record Number: [id number] Patient Account Number: [id number] Date of Birth/Sex: January 27, 1951 (67 y.o. M) Treating RN: Montey Hora Primary Care Lakechia Nay: [name] Other Clinician: Referring Feliza Diven: [name] Treating Jeovanni Heuring/Extender: [name], HOYT Weeks in Treatment: 24 Encounter Discharge Information Items Post Procedure Vitals Discharge Condition:  Stable Temperature (F): 98.7 Ambulatory Status: Wheelchair Pulse (bpm): 80 Discharge Destination: Home Respiratory Rate (breaths/min): 16 Transportation: Private Auto Blood Pressure (mmHg): 145/76 Accompanied By: spouse Schedule Follow-up Appointment: Yes Clinical Summary of Care: Electronic Signature(s) Signed: 05/17/2018 11:37:44 AM By: Montey Hora Entered By: Montey Hora on 05/17/2018 11:37:44 [name] ([id number]) -------------------------------------------------------------------------------- Lower Extremity Assessment Details Patient Name: [name] Date of Service: 05/17/2018 10:00 AM Medical Record Number: [id number] Patient Account Number: [id number] Date of Birth/Sex: 08/18/1950 (67 y.o. M) Treating RN: Secundino Ginger Primary Care Nikodem Leadbetter: [name] Other Clinician: Referring Eudora Guevarra: [name] Treating Toluwanimi Radebaugh/Extender: [name], HOYT Weeks in Treatment: 24 Electronic Signature(s) Signed: 05/17/2018 3:39:15 PM By: Secundino Ginger Entered By: Secundino Ginger on 05/17/2018 10:14:48 [name] ([id number]) -------------------------------------------------------------------------------- Multi Wound Chart Details Patient Name: [name] Date of Service: 05/17/2018 10:00 AM Medical Record Number: [id number] Patient Account Number: [id number] Date of Birth/Sex: 08/06/1950 (67 y.o. M) Treating RN: [name] Primary Care Dixie Coppa: [name] Other Clinician: Referring Giuseppina Quinones: [name] Treating Raina Sole/Extender: [name], HOYT Weeks in Treatment: 24 Vital Signs Height(in): 73 Pulse(bpm): 80 Weight(lbs): 210 Blood Pressure(mmHg): 145/76 Body Mass Index(BMI): 28 Temperature(F): 98.7 Respiratory Rate 16 (breaths/min): Photos: [N/A:N/A] Wound Location: Right Gluteal fold N/A N/A Wounding Event: Pressure Injury N/A N/A Primary Etiology: Pressure Ulcer N/A N/A Comorbid History: Cataracts,  Hypertension, N/A N/A Paraplegia Date Acquired: 11/02/2017 N/A N/A Weeks of Treatment: 24 N/A N/A Wound Status: Open N/A N/A Measurements L x W x D 4.5x3.2x2 N/A N/A (cm) Area (cm) : 11.31 N/A N/A Volume (cm) : 22.619 N/A N/A % Reduction in Area: -12.50% N/A N/A % Reduction in Volume: -2150.60% N/A N/A Classification: Category/Stage III N/A N/A Exudate Amount: Large N/A N/A Exudate Type: Serous N/A N/A Exudate Color: amber N/A N/A Wound Margin: Flat and Intact N/A N/A Granulation Amount: Small (1-33%) N/A N/A Granulation Quality: Pink N/A N/A Necrotic Amount: Large (67-100%) N/A N/A Necrotic Tissue: Eschar, Adherent Slough N/A N/A Exposed Structures: Fat Layer (Subcutaneous N/A N/A Tissue) Exposed: Yes Fascia: No Tendon: No  Muscle: No [name], [name]. ([id number]) Joint: No Bone: No Epithelialization: None N/A N/A Periwound Skin Texture: Excoriation: No N/A N/A Induration: No Callus: No Crepitus: No Rash: No Scarring: No Periwound Skin Moisture: Maceration: Yes N/A N/A Dry/Scaly: No Periwound Skin Color: Atrophie Blanche: No N/A N/A Cyanosis: No Ecchymosis: No Erythema: No Hemosiderin Staining: No Mottled: No Pallor: No Rubor: No Temperature: No Abnormality N/A N/A Tenderness on Palpation: No N/A N/A Wound Preparation: Ulcer Cleansing: N/A N/A Rinsed/Irrigated with Saline Topical Anesthetic Applied: Other: lidocaine 4% Treatment Notes Electronic Signature(s) Signed: 05/17/2018 3:24:13 PM By: Gretta Cool, BSN, RN, CWS, Kim RN, BSN Entered By: Gretta Cool, BSN, RN, CWS, Kim on 05/17/2018 10:20:48 [name], [name] ([id number]) -------------------------------------------------------------------------------- [place] Details Patient Name: [name] Date of Service: 05/17/2018 10:00 AM Medical Record Number: [id number] Patient Account Number: [id number] Date of Birth/Sex: Dec 09, 1950 (67 y.o. M) Treating RN: [name] Primary Care  Terrye Dombrosky: [name] Other Clinician: Referring Takayla Baillie: [name] Treating Jashira Cotugno/Extender: [name], HOYT Weeks in Treatment: 24 Active Inactive ` Orientation to the Wound Care Program Nursing Diagnoses: Knowledge deficit related to the wound healing center program Goals: Patient/caregiver will verbalize understanding of the [place] Program Date Initiated: 11/30/2017 Target Resolution Date: 12/21/2017 Goal Status: Active Interventions: Provide education on orientation to the wound center Notes: ` Pressure Nursing Diagnoses: Knowledge deficit related to management of pressures ulcers Goals: Patient/caregiver will verbalize understanding of pressure ulcer management Date Initiated: 05/17/2018 Target Resolution Date: 05/28/2018 Goal Status: Active Interventions: Assess: immobility, friction, shearing, incontinence upon admission and as needed Notes: ` Wound/Skin Impairment Nursing Diagnoses: Impaired tissue integrity Goals: Patient/caregiver will verbalize understanding of skin care regimen Date Initiated: 11/30/2017 Target Resolution Date: 12/21/2017 Goal Status: Active Ulcer/skin breakdown will have a volume reduction of 30% by week 4 Date Initiated: 11/30/2017 Target Resolution Date: 12/21/2017 [name], [name] ([id number]) Goal Status: Active Interventions: Assess patient/caregiver ability to obtain necessary supplies Assess patient/caregiver ability to perform ulcer/skin care regimen upon admission and as needed Assess ulceration(s) every visit Treatment Activities: Skin care regimen initiated : 11/30/2017 Notes: Electronic Signature(s) Signed: 05/17/2018 3:24:13 PM By: Gretta Cool, BSN, RN, CWS, Kim RN, BSN Entered By: Gretta Cool, BSN, RN, CWS, Kim on 05/17/2018 10:20:16 [name], [name] ([id number]) -------------------------------------------------------------------------------- Pain Assessment Details Patient Name: [name] Date of Service: 05/17/2018 10:00 AM Medical Record Number: [id number] Patient Account Number: [id number] Date of Birth/Sex: 02-Dec-1950 (67 y.o. M) Treating RN: [name] Primary Care Juliano Mceachin: [name] Other Clinician: Referring Cyann Venti: [name] Treating Damin Salido/Extender: [name], HOYT Weeks in Treatment: 24 Active Problems Location of Pain Severity and Description of Pain Patient Has Paino Yes Site Locations Rate the pain. Current Pain Level: 3 Pain Management and Medication Current Pain Management: Electronic Signature(s) Signed: 05/17/2018 3:24:13 PM By: Gretta Cool, BSN, RN, CWS, Kim RN, BSN Signed: 05/17/2018 3:33:32 PM By: Lorine Bears RCP, RRT, CHT Entered By: Lorine Bears on 05/17/2018 09:56:35 [name], [name] ([id number]) -------------------------------------------------------------------------------- Patient/Caregiver Education Details Patient Name: [name] Date of Service: 05/17/2018 10:00 AM Medical Record Number: [id number] Patient Account Number: [id number] Date of Birth/Gender: 04/06/1951 (67 y.o. M) Treating RN: [name] Primary Care Physician: [name] Other Clinician: Referring Physician: [name] Treating Physician/Extender: Sharalyn Ink in Treatment: 24 Education Assessment Education Provided To: Patient Education Topics Provided Wound/Skin Impairment: Handouts: Caring for Your Ulcer Methods: Demonstration, Explain/Verbal Responses: State content correctly Electronic Signature(s) Signed: 05/17/2018 3:24:13 PM By: Gretta Cool, BSN, RN, CWS, Kim RN, BSN Entered By: Gretta Cool,  BSN, RN, CWS, Kim on 05/17/2018 10:31:26 [name], [name] ([id number]) -------------------------------------------------------------------------------- Wound Assessment Details Patient Name: [name], [name] Date of Service: 05/17/2018 10:00 AM Medical Record Number: [id number] Patient  Account Number: [id number] Date of Birth/Sex: December 27, 1950 (67 y.o. M) Treating RN: [name] Primary Care Tabitha Riggins: [name] Other Clinician: Referring Malcolm Quast: [name] Treating Chandrea Zellman/Extender: [name], HOYT Weeks in Treatment: 24 Wound Status Wound Number: 1 Primary Etiology: Pressure Ulcer Wound Location: Right Gluteal fold Wound Status: Open Wounding Event: Pressure Injury Comorbid History: Cataracts, Hypertension, Paraplegia Date Acquired: 11/02/2017 Weeks Of Treatment: 24 Clustered Wound: No Photos Photo Uploaded By: Secundino Ginger on 05/17/2018 10:17:49 Wound Measurements Length: (cm) 4.5 Width: (cm) 3.2 Depth: (cm) 2 Area: (cm) 11.31 Volume: (cm) 22.619 % Reduction in Area: -12.5% % Reduction in Volume: -2150.6% Epithelialization: None Tunneling: No Undermining: No Wound Description Classification: Category/Stage III Foul Odor A Wound Margin: Flat and Intact Slough/Fibr Exudate Amount: Large Exudate Type: Serous Exudate Color: amber fter Cleansing: No ino Yes Wound Bed Granulation Amount: Small (1-33%) Exposed Structure Granulation Quality: Pink Fascia Exposed: No Necrotic Amount: Large (67-100%) Fat Layer (Subcutaneous Tissue) Exposed: Yes Necrotic Quality: Eschar, Adherent Slough Tendon Exposed: No Muscle Exposed: No Joint Exposed: No Bone Exposed: No Periwound Skin Texture [name], [name] ([id number]) Texture Color No Abnormalities Noted: No No Abnormalities Noted: No Callus: No Atrophie Blanche: No Crepitus: No Cyanosis: No Excoriation: No Ecchymosis: No Induration: No Erythema: No Rash: No Hemosiderin Staining: No Scarring: No Mottled: No Pallor: No Moisture Rubor: No No Abnormalities Noted: No Dry / Scaly: No Temperature / Pain Maceration: Yes Temperature: No Abnormality Wound Preparation Ulcer Cleansing: Rinsed/Irrigated with Saline Topical Anesthetic Applied: Other: lidocaine 4%, Treatment Notes Wound  #1 (Right Gluteal fold) 1. Cleansed with: Clean wound with Normal Saline 3. Peri-wound Care: Skin Prep 4. Dressing Applied: Calcium Alginate with Silver 5. Secondary Dressing Applied Bordered Foam Dressing Notes SilverCel and BFD Electronic Signature(s) Signed: 05/17/2018 3:24:13 PM By: Gretta Cool, BSN, RN, CWS, Kim RN, BSN Entered By: Gretta Cool, BSN, RN, CWS, Kim on 05/17/2018 10:19:43 [name], [name] ([id number]) -------------------------------------------------------------------------------- Vitals Details Patient Name: [name] Date of Service: 05/17/2018 10:00 AM Medical Record Number: [id number] Patient Account Number: [id number] Date of Birth/Sex: 1951-01-16 (67 y.o. M) Treating RN: [name] Primary Care Kadesha Virrueta: [name] Other Clinician: Referring Asyah Candler: [name] Treating Alta Goding/Extender: [name], HOYT Weeks in Treatment: 24 Vital Signs Time Taken: 09:52 Temperature (F): 98.7 Height (in): 73 Pulse (bpm): 80 Weight (lbs): 210 Respiratory Rate (breaths/min): 16 Body Mass Index (BMI): 27.7 Blood Pressure (mmHg): 145/76 Reference Range: 80 - 120 mg / dl Electronic Signature(s) Signed: 05/17/2018 3:33:32 PM By: Lorine Bears RCP, RRT, CHT Entered By: Lorine Bears on 05/17/2018 09:58:17

## 2018-05-20 NOTE — Progress Notes (Signed)
Pharmacy Antibiotic Note  [name] is a 67 y.o. male admitted on 05/19/2018 with cellulitis.  Pharmacy has been consulted for vancomycin dosing.  Plan: 05/20/18 patient weight has been updated and is less than what was used for initial vancomycin calculations. Now source seems to be right hip ulcer/cellulitis. Reduce vancomycin to 1.25 gm IV Q12H. Pharmacy will continue to follow and adjust as needed to maintain trough 10 to 15 mcg/ml for cellulitis.  Height: 6' 0.1" (183.1 cm) Weight: 194 lb 0.1 oz (88 kg) IBW/kg (Calculated) : 77.83  Temp (24hrs), Avg:100.1 F (37.8 C), Min:99.5 F (37.5 C), Max:100.7 F (38.2 C)  Recent Labs  Lab 05/19/18 2318 05/20/18 0054  WBC 14.7*  --   CREATININE 0.81  --   LATICACIDVEN 2.6* 0.6    Estimated Creatinine Clearance: 98.7 mL/min (by C-G formula based on SCr of 0.81 mg/dL).    Allergies  Allergen Reactions  . Clindamycin Hives  . Doxycycline Rash  . Penicillins Rash  . Sulfamethoxazole Rash    Antimicrobials this admission: Ceftriaxone, vancomycin 10/30  >>    >>   Dose adjustments this admission: 10/31 weight changed since admission - reduce vancomycin to 1.25 gm IV Q12H  Microbiology results: 10/30 BCx: pending   Thank you for allowing pharmacy to be a part of this patient's care.  Laural Benes, Pharm.D., BCPS Clinical Pharmacist 05/20/2018 8:53 AM

## 2018-05-20 NOTE — Progress Notes (Signed)
Initial Nutrition Assessment  DOCUMENTATION CODES:   Not applicable  INTERVENTION:   Ocuvite daily for wound healing (provides zinc, vitamin A, vitamin C, Vitamin E, copper, and selenium)  Premier Protein daily, each supplement provides 160 kcal and 30 grams of protein.   Juven Fruit Punch BID, each serving provides 95kcal and 2.5g of protein (amino acids glutamine and arginine)  Vitamin C 221m po TID  Liberalize diet as a heart healthy diet restricts protein  NUTRITION DIAGNOSIS:   Increased nutrient needs related to wound healing as evidenced by increased estimated needs.  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 Pt reports good appetite and oral intake at baseline but reports his appetite has been decreased over the past few days. Pt ate 100% of a blueberry muffin for breakfast this morning and ate 1/2 grilled cheese sandwich and a fruit cup for lunch today. Per chart, pt with 46lb(19%) wt loss over the past year which is not quite significant given the time frame but is worth noting. Pt is followed by the wound clinic; pt takes zinc and vitamin C at home. Spoke with patient about the risks of taking long term zinc supplementation including depletion of other minerals such as copper and iron. Recommend Ocuvite which provides zinc and copper supplementation. Patient is willing to try Juven and Premier Protein. Recommend 100g protein intake daily. Pt ordered for vitamin C 10024mTID; only about 200-30026mf vitamin C are absorbed at one time. With high doses of vitamin C > 1g  absorption is decreased by ~50%. Will change vitamin C regimen to 250m32m TID; pt will also receive vitamin C from the Juven and Ocuvite. RD will liberalize pt's diet as a heart health diet restricts protein also.    Medications reviewed and include: lovenox, miralax, vitamin C, NaCl _0 /hr, ceftriaxone, vancomycin  Labs reviewed: wbc- 14.7(H)  NUTRITION - FOCUSED PHYSICAL EXAM:    Most Recent Value  Orbital Region  No depletion  Upper Arm Region  No depletion  Thoracic and Lumbar Region  No depletion  Buccal Region  No depletion  Temple Region  No depletion  Clavicle Bone Region  No depletion  Clavicle and Acromion Bone Region  No depletion  Scapular Bone Region  No depletion  Dorsal Hand  No depletion  Patellar Region  Unable to assess  Anterior Thigh Region  Unable to assess  Posterior Calf Region  Unable to assess  Edema (RD Assessment)  None  Hair  Reviewed  Eyes  Reviewed  Mouth  Reviewed  Skin  Reviewed  Nails  Reviewed     Diet Order:   Diet Order            Diet regular Room service appropriate? Yes; Fluid consistency: Thin  Diet effective now             EDUCATION NEEDS:   Education needs have been addressed  Skin:  Skin Assessment: Reviewed RN Assessment(R ischium wound 6cm x 5cm x 0.2cm)  Last BM:  pta  Height:   Ht Readings  from Last 1 Encounters:  05/20/18 6' 0.1" (1.831 m)    Weight:   Wt Readings from Last 1 Encounters:  05/20/18 88 kg    Ideal Body Weight:  80.9 kg  BMI:  Body mass index is 26.24 kg/m.  Estimated Nutritional Needs:   Kcal:  2200-2500kcal/day   Protein:  97-115g/day   Fluid:  >2.2L/day   Koleen Distance MS, RD, LDN Pager #- [phone] Office#- [phone] After Hours Pager: [phone]

## 2018-05-20 NOTE — Progress Notes (Signed)
Pharmacy Antibiotic Note  [name] is a 67 y.o. male admitted on 05/19/2018 with cellulitis.  Pharmacy has been consulted for vancomycin dosing.  Plan: DW 95kg  Vd 67L kei 0.088 hr-1  T1/2 8 hours Vancomycin 1250 mg q 8 hours ordered. Not candidate for stacked dosing. Level before 5th dose. Goal trough 15-20  Height: 6\' 1"  (185.4 cm) Weight: 210 lb (95.3 kg) IBW/kg (Calculated) : 79.9  Temp (24hrs), Avg:100.4 F (38 C), Min:100.1 F (37.8 C), Max:100.7 F (38.2 C)  Recent Labs  Lab 05/19/18 2318 05/20/18 0054  WBC 14.7*  --   CREATININE 0.81  --   LATICACIDVEN 2.6* 0.6    Estimated Creatinine Clearance: 101.4 mL/min (by C-G formula based on SCr of 0.81 mg/dL).    Allergies  Allergen Reactions  . Clindamycin Hives  . Doxycycline Rash  . Penicillins Rash  . Sulfamethoxazole Rash    Antimicrobials this admission: Ceftriaxone, vancomycin 10/30  >>    >>   Dose adjustments this admission:   Microbiology results: 10/30 BCx: pending   Thank you for allowing pharmacy to be a part of this patient's care.  Terriana Barreras S 05/20/2018 1:46 AM

## 2018-05-21 ENCOUNTER — Encounter: Payer: Self-pay | Admitting: Radiology

## 2018-05-21 ENCOUNTER — Inpatient Hospital Stay: Payer: Medicare Other

## 2018-05-21 LAB — VANCOMYCIN, TROUGH: Vancomycin Tr: 10 ug/mL — ABNORMAL LOW (ref 15–20)

## 2018-05-21 LAB — CBC
HCT: 35.8 % — ABNORMAL LOW (ref 39.0–52.0)
Hemoglobin: 12 g/dL — ABNORMAL LOW (ref 13.0–17.0)
MCH: 29.9 pg (ref 26.0–34.0)
MCHC: 33.5 g/dL (ref 30.0–36.0)
MCV: 89.3 fL (ref 80.0–100.0)
Platelets: 209 10*3/uL (ref 150–400)
RBC: 4.01 MIL/uL — AB (ref 4.22–5.81)
RDW: 11.7 % (ref 11.5–15.5)
WBC: 10.9 10*3/uL — AB (ref 4.0–10.5)
nRBC: 0 % (ref 0.0–0.2)

## 2018-05-21 LAB — BASIC METABOLIC PANEL
Anion gap: 11 (ref 5–15)
BUN: 14 mg/dL (ref 8–23)
CO2: 24 mmol/L (ref 22–32)
CREATININE: 0.58 mg/dL — AB (ref 0.61–1.24)
Calcium: 8.3 mg/dL — ABNORMAL LOW (ref 8.9–10.3)
Chloride: 103 mmol/L (ref 98–111)
GFR calc Af Amer: >60 mL/min (ref >60)
GFR calc non Af Amer: >60 mL/min (ref >60)
Glucose, Bld: 156 mg/dL — ABNORMAL HIGH (ref 70–99)
POTASSIUM: 3.5 mmol/L (ref 3.5–5.1)
SODIUM: 138 mmol/L (ref 135–145)

## 2018-05-21 LAB — URINE CULTURE: Culture: NO GROWTH

## 2018-05-21 MED ORDER — IOHEXOL 300 MG/ML  SOLN
100.0000 mL | Freq: Once | INTRAMUSCULAR | Status: AC | PRN
  Administered 2018-05-21: 100 mL via INTRAVENOUS

## 2018-05-21 MED ORDER — IOPAMIDOL (ISOVUE-300) INJECTION 61%: 100.0000 mL | Freq: Once | INTRAVENOUS | Status: DC | PRN

## 2018-05-21 NOTE — Plan of Care (Signed)

## 2018-05-21 NOTE — Progress Notes (Addendum)
[place] [facility] [place] NAME: [name]    MR#:  [id number]  DATE OF BIRTH:  1950-11-10  SUBJECTIVE: Patient is seen at bedside, admitted for secondary to sacral decubiti.  Continues to have fever.  No cough, diarrhea.  CHIEF COMPLAINT:   Chief Complaint  Patient presents with  . Wound Check    REVIEW OF SYSTEMS:    Review of Systems  Constitutional: Negative for chills and fever.  HENT: Negative for hearing loss.   Eyes: Negative for blurred vision, double vision and photophobia.  Respiratory: Negative for cough, hemoptysis and shortness of breath.   Cardiovascular: Negative for palpitations, orthopnea and leg swelling.  Gastrointestinal: Negative for abdominal pain, diarrhea and vomiting.  Genitourinary: Negative for dysuria and urgency.  Musculoskeletal: Positive for back pain. Negative for myalgias and neck pain.  Skin: Negative for rash.  Neurological: Negative for dizziness, focal weakness, seizures, weakness and headaches.  Psychiatric/Behavioral: Negative for memory loss. The patient does not have insomnia.    Ulcer in the right ischial area Nutrition: Tolerating Diet: Tolerating PT:      DRUG ALLERGIES:   Allergies  Allergen Reactions  . Clindamycin Hives  . Doxycycline Rash  . Penicillins Rash  . Sulfamethoxazole Rash    VITALS:  Blood pressure 131/67, pulse 100, temperature 99.7 F (37.6 C), temperature source Oral, resp. rate 18, height 6' 0.1" (1.831 m), weight 89 kg, SpO2 97 %.  PHYSICAL EXAMINATION:   Physical Exam  GENERAL:  67 y.o.-year-old patient lying in the bed with no acute distress.  EYES: Pupils equal, round, reactive to light and accommodation. No scleral icterus. Extraocular muscles intact.  HEENT: Head atraumatic, normocephalic. Oropharynx and nasopharynx clear.  NECK:  Supple, no jugular venous distention. No thyroid enlargement, no tenderness.  LUNGS: Normal breath sounds  bilaterally, no wheezing, rales,rhonchi or crepitation. No use of accessory muscles of respiration.  CARDIOVASCULAR: S1, S2 normal. No murmurs, rubs, or gallops.  ABDOMEN: Soft, nontender, nondistended. Bowel sounds present. No organomegaly or mass.  EXTREMITIES: No pedal edema, cyanosis, or clubbing.  NEUROLOGIC: Cranial nerves II through XII are intact. Muscle strength 5/5 in all extremities. Sensation intact. Gait not checked.  PSYCHIATRIC: The patient is alert and oriented x 3.  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  Seen by wound care nurse. LABORATORY PANEL:   CBC Recent Labs  Lab 05/21/18 0902  WBC 10.9*  HGB 12.0*  HCT 35.8*  PLT 209   ------------------------------------------------------------------------------------------------------------------  Chemistries  Recent Labs  Lab 05/19/18 2318 05/21/18 0902  NA 136 138  K 3.5 3.5  CL 100 103  CO2 25 24  GLUCOSE 187* 156*  BUN 18 14  CREATININE 0.81 0.58*  CALCIUM 8.8* 8.3*  AST 19  --   ALT 10  --   ALKPHOS 53  --   BILITOT 0.7  --    ------------------------------------------------------------------------------------------------------------------  Cardiac Enzymes Recent Labs  Lab 05/19/18 2318  TROPONINI <0.03   ------------------------------------------------------------------------------------------------------------------  RADIOLOGY:  Ct Lumbar Spine W Contrast  Result Date: 05/21/2018 CLINICAL DATA:  Sepsis with sacral decubitus ulcer. Back and right hip pain. History of prostate cancer and spinal AVM. EXAM: CT LUMBAR SPINE WITH CONTRAST TECHNIQUE: Multidetector CT imaging of the lumbar spine was performed with intravenous contrast administration. CONTRAST:  147mL OMNIPAQUE IOHEXOL 300 MG/ML  SOLN COMPARISON:  CT abdomen and pelvis 04/07/2017. Lumbar spine MRI 12/01/2004. FINDINGS: Segmentation: 5 lumbar type vertebrae. Alignment: Slight right convex curvature  of the lumbar spine.  Straightening of the normal lumbar lordosis. No significant listhesis. Vertebrae: Mild chronic anterior wedging of the L1 vertebral body. No acute fracture. Severe disc degeneration from L2-3 to L4-5 with advanced disc space narrowing, endplate irregularity and sclerosis, and vacuum disc phenomenon, similar to the prior CT. Milder disc space narrowing and vacuum disc at L1-2 and L5-S1. No interval osseous destruction to suggest lumbar discitis-osteomyelitis or sacral osteomyelitis. Partially visualized spinal cord stimulator leads and intrathecal baclofen pump catheter coursing into the thoracic spine. Partial left SI joint ankylosis. Paraspinal and other soft tissues: Subcentimeter low-density lesions in the left kidney, too small to fully characterize. Aortic atherosclerosis. Large volume rectosigmoid stool. No paraspinal fluid collection. Disc levels: T12-L1: Left facet hypertrophy without stenosis. L1-2: Disc bulging asymmetric to the right results in minimal right neural foraminal narrowing without evidence of significant spinal stenosis. L2-3: Small chronic left central disc extrusion with mild superior migration, circumferential disc bulging, and mild facet and ligamentum flavum hypertrophy result in left greater than right lateral recess stenosis, mild spinal stenosis, and mild left neural foraminal stenosis, progressed from the prior MRI. L3-4: Circumferential disc osteophyte complex and moderate facet and ligamentum flavum hypertrophy result in severe spinal stenosis and mild-to-moderate right and moderate left neural foraminal stenosis, progressed from the prior MRI. L4-5: Prior left laminectomy. Circumferential disc osteophyte complex and mild facet hypertrophy result in mild bilateral neural foraminal stenosis, mildly progressed from the prior MRI. No definite compressive spinal stenosis. L5-S1: Prior left laminectomy. Circumferential disc bulging, endplate spurring greater to the left, and mild facet  hypertrophy result in mild right and moderate left neural foraminal stenosis, similar to the prior MRI. No definite compressive spinal stenosis. IMPRESSION: 1. No bone destruction to suggest lumbar discitis-osteomyelitis or sacral osteomyelitis. No paraspinal abscess. 2. Severe lumbar disc degeneration with severe spinal stenosis at L3-4, progressed from the 2006 MRI. 3. Moderate multilevel neural foraminal stenosis. 4.  Aortic Atherosclerosis (ICD10-I70.0). Electronically Signed   By: Logan Bores M.D.   On: 05/21/2018 10:25   Dg Chest Port 1 View  Result Date: 05/19/2018 CLINICAL DATA:  67 year old male with sepsis. EXAM: PORTABLE CHEST 1 VIEW COMPARISON:  Chest radiograph dated 02/18/2017 FINDINGS: The lungs are clear. There is no pleural effusion or pneumothorax. The cardiac silhouette is within normal limits. Spinal stimulator noted over the lower thoracic spine. No acute osseous pathology. IMPRESSION: No active disease. Electronically Signed   By: Anner Crete M.D.   On: 05/19/2018 23:43     ASSESSMENT AND PLAN:   Active Problems:   Sepsis ([place])   Pressure injury of skin  1.Sepsis present on admission secondary to sacral decubiti: Blood cultures are for 24 hours despite blood cultures from the wound showed gram-positive cocci.  Final cultures are pending.,  Continue IV antibiotics with vancomycin, being, WBC normalized, patient CT of the hip showed no osteomyelitis.  Change the bed to [place] bed, watch for 24 hours if afebrile likely discharge home with home health nurse for dressing changes, patient is to follow-up with wound care clinic. History of spinal cord stroke, paraplegia, patient is on 2,baclofen pump, spinal cord stimulator, patient does self cath every day. 3.History of prostate cancer, patient is on Casodex, oxybutynin, Myrbetriq, self cath in and out.  Patient has neurogenic bladder. Discussed with wife.  All the records are reviewed and case discussed with Care  Management/Social Workerr. Management plans discussed with the patient, family and they are in agreement.  CODE STATUS: Full code  TOTAL TIME TAKING CARE OF THIS PATIENT: 38 minutes.   POSSIBLE D/C IN 1-2DAYS, DEPENDING ON CLINICAL CONDITION.  MORE than 50% time spent in counseling, coordination of Epifanio Lesches M.D on 05/21/2018 at 12:04 PM  Between 7am to 6pm - Pager - [phone]  After 6pm go to [email or web address] - password [email or web address] [facility]  Office  [phone]  CC: Primary care physician; [name], MD

## 2018-05-21 NOTE — Progress Notes (Signed)
Patient requesting to have foley catheter inserted due to increased frequency of having to self cath.  Unable to insert 14 fr foley in with met resistance.  Advised for possible urology to see

## 2018-05-21 NOTE — Progress Notes (Signed)
Pharmacy Antibiotic Note  DALTYN DEGROAT is a 67 y.o. male admitted on 05/19/2018 with cellulitis.  Pharmacy has been consulted for vancomycin dosing.  Plan: 05/20/18 patient weight has been updated and is less than what was used for initial vancomycin calculations. Now source seems to be right hip ulcer/cellulitis. Reduce vancomycin to 1.25 gm IV Q12H. Pharmacy will continue to follow and adjust as needed to maintain trough 10 to 15 mcg/ml for cellulitis.  11/1 1800 Vancomycin trough resulted 83mcg/ml, will continue with current regimen  Height: 6' 0.1" (183.1 cm) Weight: 196 lb 3.4 oz (89 kg) IBW/kg (Calculated) : 77.83  Temp (24hrs), Avg:99.7 F (37.6 C), Min:99.7 F (37.6 C), Max:99.7 F (37.6 C)  Recent Labs  Lab 05/19/18 2318 05/20/18 0054 05/21/18 0902 05/21/18 1717  WBC 14.7*  --  10.9*  --   CREATININE 0.81  --  0.58*  --   LATICACIDVEN 2.6* 0.6  --   --   VANCOTROUGH  --   --   --  10*    Estimated Creatinine Clearance: 100 mL/min (A) (by C-G formula based on SCr of 0.58 mg/dL (L)).    Allergies  Allergen Reactions  . Clindamycin Hives  . Doxycycline Rash  . Penicillins Rash  . Sulfamethoxazole Rash    Antimicrobials this admission: Ceftriaxone, vancomycin 10/30  >>    >>   Dose adjustments this admission: 10/31 weight changed since admission - reduce vancomycin to 1.25 gm IV Q12H  Microbiology results: 10/30 BCx: pending   Thank you for allowing pharmacy to be a part of this patient's care.  Paulina Fusi, PharmD, BCPS 05/21/2018 6:35 PM

## 2018-05-22 MED ORDER — COLLAGENASE 250 UNIT/GM EX OINT: TOPICAL_OINTMENT | Freq: Every day | CUTANEOUS | 0 refills | Status: DC

## 2018-05-22 MED ORDER — LEVOFLOXACIN 500 MG PO TABS: 500.0000 mg | ORAL_TABLET | Freq: Every day | ORAL | 0 refills | Status: AC

## 2018-05-22 MED ORDER — JUVEN PO PACK: 1.0000 | PACK | Freq: Two times a day (BID) | ORAL | 0 refills | Status: DC

## 2018-05-22 NOTE — Care Management Note (Signed)
Case Management Note  Patient Details  Name: [name] MRN: [id number] Date of Birth: 04/19/1951  Subjective/Objective:   Patient to be discharged per MD order. Orders in place for home health services. Patient needs home health for wound maintainence and dressing changes. Patient and family prefer [facility]. Referral placed with Jermaine who agrees to accept the patient and will attempt to have start of care for tomorrow 11/3. Primary RN was able to perform daily dressing change today so patient would leave with a clean wound. There are no other needs. Spouse to provide transport.  Brett Bloomer RN BSN RNCM [phone]                    Action/Plan:   Expected Discharge Date:  05/22/18               Expected Discharge Plan:  [place]  In-House Referral:     Discharge planning Services  CM Consult  Post Acute Care Choice:  Home Health Choice offered to:  Patient  DME Arranged:    DME Agency:     HH Arranged:  RN [place] Agency:  [place]  Status of Service:  Completed, signed off  If discussed at [place] of Stay Meetings, dates discussed:    Additional Comments:  Brett Maudlin, RN 05/22/2018, 11:57 AM

## 2018-05-22 NOTE — Progress Notes (Signed)
DISCHARGE NOTE:  Pt given discharge instructions. Wife at bedside,  providing transportation. Pt wheeled to car by staff member.

## 2018-05-24 ENCOUNTER — Encounter: Payer: Medicare Other | Attending: Physician Assistant | Admitting: Physician Assistant

## 2018-05-24 DIAGNOSIS — L89314 Pressure ulcer of right buttock, stage 4: Secondary | ICD-10-CM | POA: Diagnosis not present

## 2018-05-24 DIAGNOSIS — G8220 Paraplegia, unspecified: Secondary | ICD-10-CM | POA: Insufficient documentation

## 2018-05-24 DIAGNOSIS — I10 Essential (primary) hypertension: Secondary | ICD-10-CM | POA: Insufficient documentation

## 2018-05-24 DIAGNOSIS — S34109S Unspecified injury to unspecified level of lumbar spinal cord, sequela: Secondary | ICD-10-CM | POA: Diagnosis not present

## 2018-05-24 DIAGNOSIS — X58XXXS Exposure to other specified factors, sequela: Secondary | ICD-10-CM | POA: Diagnosis not present

## 2018-05-24 DIAGNOSIS — C61 Malignant neoplasm of prostate: Secondary | ICD-10-CM | POA: Diagnosis not present

## 2018-05-24 LAB — CULTURE, BLOOD (ROUTINE X 2)
CULTURE: NO GROWTH
Culture: NO GROWTH

## 2018-05-25 ENCOUNTER — Ambulatory Visit: Payer: Medicare Other | Admitting: Physical Therapy

## 2018-05-25 LAB — AEROBIC/ANAEROBIC CULTURE (SURGICAL/DEEP WOUND)

## 2018-05-25 LAB — AEROBIC/ANAEROBIC CULTURE W GRAM STAIN (SURGICAL/DEEP WOUND): Special Requests: NORMAL

## 2018-05-26 NOTE — Discharge Summary (Signed)
[name], is a 67 y.o. male  DOB [birth year]  MRN [id number].  Admission date:  05/19/2018  Admitting Physician  Harrie Foreman, MD  Discharge Date:  05/22/2018   Primary MD  [name], MD  Recommendations for primary care physician for things to follow:  Follow-up with PCP in 1 week   Admission Diagnosis  Wound infection [T14.8XXA, L08.9] Elevated lactic acid level [R79.89] Fever, unspecified fever cause [R50.9] Leukocytosis, unspecified type [D72.829] Sepsis without acute organ dysfunction, due to unspecified organism [facility]) [A41.9]   Discharge Diagnosis  Wound infection [T14.8XXA, L08.9] Elevated lactic acid level [R79.89] Fever, unspecified fever cause [R50.9] Leukocytosis, unspecified type [D72.829] Sepsis without acute organ dysfunction, due to unspecified organism ([place]) [A41.9]    Active Problems:   Sepsis ([place])   Pressure injury of skin      Past Medical History:  Diagnosis Date  . AVM (arteriovenous malformation) spine   . Cancer [facility])    prostate, taking Casodex (hormone chemotherapy)  . History of kidney stones    several times  . Hypertension   . 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 Prostate cancer ([place])   . Status post insertion of intrathecal baclofen pump   . Status post insertion of spinal cord stimulator     Past Surgical History:  Procedure Laterality Date  . COLONOSCOPY    . CYSTOSCOPY     several times  . CYSTOSCOPY WITH DIRECT VISION INTERNAL URETHROTOMY  05/05/2017   Procedure: CYSTOSCOPY WITH DIRECT VISION INTERNAL URETHROTOMY;  Surgeon: Royston Cowper, MD;  Location: [facility];  Service: Urology;;  . Consuela Mimes WITH LITHOLAPAXY N/A 05/05/2017   Procedure: CYSTOSCOPY WITH LITHOLAPAXY;  Surgeon: Royston Cowper, MD;  Location: [facility];   Service: Urology;  Laterality: N/A;  . EXTRACORPOREAL SHOCK WAVE LITHOTRIPSY    . EYE SURGERY Bilateral    cataract surgery  . HOLMIUM LASER APPLICATION N/A 34/19/6222   Procedure: HOLMIUM LASER APPLICATION;  Surgeon: Royston Cowper, MD;  Location: [facility];  Service: Urology;  Laterality: N/A;  . LUMBAR [place] SURGERY  2005   resulted in paralysis of lower extrems       History of present illness and  Hospital Course:     Kindly see H&P for history of present illness and admission details, please review complete Labs, Consult reports and Test reports for all details in brief  HPI  from the history and physical done on the day of admission 67 year old male patient with history of lumbar spinal cord stroke, AV malformation of the spine, prostate cancer comes in because of sacral decubiti and fever.  Went to wound care for the wound check and was advised to come to the hospital because of drainage from the wound. Admitted for sepsis due to sacral decubiti.   Hospital Course   Sepsis present on admission secondary to sacral decubiti, blood cultures are negative for 24 hours.  Received vancomycin in the hospital, CT of the hip did not show osteomyelitis.  By the wound care nurse, patient has right ischial pressure ulcer stage IV, wound care nurse recommends daily dressing changes with Santyl,,topped with NS moistened gauze TO ACTIVATE SANTYL. Pt instructed on shifting weight frequently and using pressure alternating pad in wheel chair.  Discharge home with Levaquin for 10 days, arrange home health nurse for wound care  2.History of spinal cord stroke, paraplegia, patient is on 2,baclofen pump, spinal cord stimulator, patient does self cath every day.  3.History of prostate cancer, patient is on Casodex, oxybutynin, discharge Condition: Stable   Follow UP  Follow-up Information    Hande, Vishwanath, MD. Schedule an appointment as soon as possible for a visit in 1 week(s).   Specialty:   Internal Medicine Contact information: [address] [place] [address] [phone]             Discharge Instructions  and  Discharge Medications     Discharge Instructions    Face-to-face encounter (required for Medicare/Medicaid patients)   Complete by:  As directed    I Epifanio Lesches certify that this patient is under my care and that I, or a nurse practitioner or physician's assistant working with me, had a face-to-face encounter that meets the physician face-to-face encounter requirements with this patient on 05/22/2018. The encounter with the patient was in whole, or in part for the following medical condition(s) which is the primary reason for home health care  Stage IV sacral decubiti.   The encounter with the patient was in whole, or in part, for the following medical condition, which is the primary reason for home health care:  Whole   I certify that, based on my findings, the following services are medically necessary home health services:  Nursing   Reason for Medically Necessary Home Health Services:  Skilled Nursing- Assessment and Observation of Wound Status   My clinical findings support the need for the above services:  Can transfer bed to chair only   Further, I certify that my clinical findings support that this patient is homebound due to:  Open/draining pressure/stasis ulcer   Home Health   Complete by:  As directed    Changes for right buttock decubiti   To provide the following care/treatments:  RN     Allergies as of 05/22/2018      Reactions   Clindamycin Hives   Doxycycline Rash   Penicillins Rash   Sulfamethoxazole Rash      Medication List    TAKE these medications   baclofen 10 MG tablet Commonly known as:  LIORESAL Take 10 mg by mouth daily as needed for muscle spasms.   bicalutamide 50 MG tablet Commonly known as:  CASODEX Take 50 mg by mouth daily.   collagenase ointment Commonly known as:   SANTYL Apply topically daily.   gabapentin 800 MG tablet Commonly known as:  NEURONTIN Take 800 mg by mouth 3 (three) times daily.   levofloxacin 500 MG tablet Commonly known as:  LEVAQUIN Take 1 tablet (500 mg total) by mouth daily for 10 days.   losartan 100 MG tablet Commonly known as:  COZAAR Take 100 mg by mouth daily.   methenamine 1 g tablet Commonly known as:  HIPREX Take 1 g by mouth 4 (four) times daily.   mirabegron ER 50 MG Tb24 tablet Commonly known as:  MYRBETRIQ Take 50 mg by mouth daily.   nutrition supplement (JUVEN) Pack Take 1 packet by mouth 2 (two) times daily between meals.   oxybutynin 10 MG 24 hr tablet Commonly known as:  DITROPAN-XL Take 10 mg by mouth every morning.   PAIN MANAGEMENT INTRATHECAL (IT) PUMP 1 each by Intrathecal route continuous. Intrathecal (IT) medication:  Baclofen, Dilaudid   polyethylene glycol powder powder Commonly known as:  GLYCOLAX/MIRALAX Take 17 g by mouth daily.   SYSTANE BALANCE 0.6 % Soln Generic drug:  Propylene Glycol Place 1 drop into both eyes daily as needed (dry eyes).   vitamin  C 500 MG tablet Commonly known as:  ASCORBIC ACID Take 1,000 mg by mouth 3 (three) times daily.         Diet and Activity recommendation: See Discharge Instructions above   Consults obtained [wound care nurse  Major procedures and Radiology Reports - PLEASE review detailed and final reports for all details, in brief -      Ct Lumbar Spine W Contrast  Result Date: 05/21/2018 CLINICAL DATA:  Sepsis with sacral decubitus ulcer. Back and right hip pain. History of prostate cancer and spinal AVM. EXAM: CT LUMBAR SPINE WITH CONTRAST TECHNIQUE: Multidetector CT imaging of the lumbar spine was performed with intravenous contrast administration. CONTRAST:  132mL OMNIPAQUE IOHEXOL 300 MG/ML  SOLN COMPARISON:  CT abdomen and pelvis 04/07/2017. Lumbar spine MRI 12/01/2004. FINDINGS: Segmentation: 5 lumbar type vertebrae.  Alignment: Slight right convex curvature of the lumbar spine. Straightening of the normal lumbar lordosis. No significant listhesis. Vertebrae: Mild chronic anterior wedging of the L1 vertebral body. No acute fracture. Severe disc degeneration from L2-3 to L4-5 with advanced disc space narrowing, endplate irregularity and sclerosis, and vacuum disc phenomenon, similar to the prior CT. Milder disc space narrowing and vacuum disc at L1-2 and L5-S1. No interval osseous destruction to suggest lumbar discitis-osteomyelitis or sacral osteomyelitis. Partially visualized spinal cord stimulator leads and intrathecal baclofen pump catheter coursing into the thoracic spine. Partial left SI joint ankylosis. Paraspinal and other soft tissues: Subcentimeter low-density lesions in the left kidney, too small to fully characterize. Aortic atherosclerosis. Large volume rectosigmoid stool. No paraspinal fluid collection. Disc levels: T12-L1: Left facet hypertrophy without stenosis. L1-2: Disc bulging asymmetric to the right results in minimal right neural foraminal narrowing without evidence of significant spinal stenosis. L2-3: Small chronic left central disc extrusion with mild superior migration, circumferential disc bulging, and mild facet and ligamentum flavum hypertrophy result in left greater than right lateral recess stenosis, mild spinal stenosis, and mild left neural foraminal stenosis, progressed from the prior MRI. L3-4: Circumferential disc osteophyte complex and moderate facet and ligamentum flavum hypertrophy result in severe spinal stenosis and mild-to-moderate right and moderate left neural foraminal stenosis, progressed from the prior MRI. L4-5: Prior left laminectomy. Circumferential disc osteophyte complex and mild facet hypertrophy result in mild bilateral neural foraminal stenosis, mildly progressed from the prior MRI. No definite compressive spinal stenosis. L5-S1: Prior left laminectomy. Circumferential disc  bulging, endplate spurring greater to the left, and mild facet hypertrophy result in mild right and moderate left neural foraminal stenosis, similar to the prior MRI. No definite compressive spinal stenosis. IMPRESSION: 1. No bone destruction to suggest lumbar discitis-osteomyelitis or sacral osteomyelitis. No paraspinal abscess. 2. Severe lumbar disc degeneration with severe spinal stenosis at L3-4, progressed from the 2006 MRI. 3. Moderate multilevel neural foraminal stenosis. 4.  Aortic Atherosclerosis (ICD10-I70.0). Electronically Signed   By: Logan Bores M.D.   On: 05/21/2018 10:25   Dg Chest Port 1 View  Result Date: 05/19/2018 CLINICAL DATA:  67 year old male with sepsis. EXAM: PORTABLE CHEST 1 VIEW COMPARISON:  Chest radiograph dated 02/18/2017 FINDINGS: The lungs are clear. There is no pleural effusion or pneumothorax. The cardiac silhouette is within normal limits. Spinal stimulator noted over the lower thoracic spine. No acute osseous pathology. IMPRESSION: No active disease. Electronically Signed   By: Anner Crete M.D.   On: 05/19/2018 23:43    Micro Results     Recent Results (from the past 240 hour(s))  Culture, blood (Routine x 2)     Status:  None   Collection Time: 05/19/18 11:18 PM  Result Value Ref Range Status   Specimen Description BLOOD BLOOD RIGHT FOREARM  Final   Special Requests   Final    BOTTLES DRAWN AEROBIC AND ANAEROBIC Blood Culture results may not be optimal due to an excessive volume of blood received in culture bottles   Culture   Final    NO GROWTH 5 DAYS Performed at [facility], [address] [place], [place] [address]    Report Status 05/24/2018 FINAL  Final  Culture, blood (Routine x 2)     Status: None   Collection Time: 05/19/18 11:20 PM  Result Value Ref Range Status   Specimen Description BLOOD RIGHT ANTECUBITAL  Final   Special Requests   Final    BOTTLES DRAWN AEROBIC AND ANAEROBIC Blood Culture results may not be optimal due  to an excessive volume of blood received in culture bottles   Culture   Final    NO GROWTH 5 DAYS Performed at [facility], [address] [place], [place] [address]    Report Status 05/24/2018 FINAL  Final  Aerobic/Anaerobic Culture (surgical/deep wound)     Status: None   Collection Time: 05/19/18 11:43 PM  Result Value Ref Range Status   Specimen Description   Final    BUTTOCKS Performed at [facility], [address] [place], [place] [address]    Special Requests   Final    Normal Performed at [facility], [address] [place], [place] [address]    Gram Stain   Final    RARE WBC PRESENT, PREDOMINANTLY PMN RARE GRAM POSITIVE COCCI    Culture   Final    FEW STAPHYLOCOCCUS AUREUS FEW GROUP B STREP(S.AGALACTIAE)ISOLATED TESTING AGAINST S. AGALACTIAE NOT ROUTINELY PERFORMED DUE TO PREDICTABILITY OF AMP/PEN/VAN SUSCEPTIBILITY. NO ANAEROBES ISOLATED Performed at [facility], [place] [address] [place], [place] [address]    Report Status 05/25/2018 FINAL  Final   Organism ID, Bacteria STAPHYLOCOCCUS AUREUS  Final      Susceptibility   Staphylococcus aureus - MIC*    CIPROFLOXACIN <=0.5 SENSITIVE Sensitive     ERYTHROMYCIN >=8 RESISTANT Resistant     GENTAMICIN <=0.5 SENSITIVE Sensitive     OXACILLIN 0.5 SENSITIVE Sensitive     TETRACYCLINE <=1 SENSITIVE Sensitive     VANCOMYCIN 1 SENSITIVE Sensitive     TRIMETH/SULFA <=10 SENSITIVE Sensitive     CLINDAMYCIN RESISTANT Resistant     RIFAMPIN <=0.5 SENSITIVE Sensitive     Inducible Clindamycin POSITIVE Resistant     * FEW STAPHYLOCOCCUS AUREUS  Urine culture     Status: None   Collection Time: 05/20/18  3:04 AM  Result Value Ref Range Status   Specimen Description   Final    URINE, RANDOM Performed at [facility], [address], [place], [place] [address]    Special Requests   Final    NONE Performed at [facility], [address] [place], [place]  [address]    Culture   Final    NO GROWTH Performed at [facility], [place] [address] [place], [place] [address]    Report Status 05/21/2018 FINAL  Final       Today   Subjective:   [name] today has no headache,no chest abdominal pain,no new weakness tingling or numbness, feels much better wants to go home today.   Objective:   Blood pressure 132/71, pulse 88, temperature 99.8 F (37.7  C), temperature source Oral, resp. rate 18, height 6' 0.1" (1.831 m), weight 70.9 kg, SpO2 99 %.  No intake or output data in the 24 hours ending 05/26/18 1333  Exam Awake Alert, Oriented x 3, No new F.N deficits, Normal affect [place].AT,PERRAL Supple Neck,No JVD, No cervical lymphadenopathy appriciated.  Symmetrical Chest wall movement, Good air movement bilaterally, CTAB RRR,No Gallops,Rubs or new Murmurs, No Parasternal Heave +ve B.Sounds, Abd Soft, Non tender, No organomegaly appriciated, No rebound -guarding or rigidity. No Cyanosis, Clubbing or edema, No new Rash or bruise Patient has a right ischial ulcer present Data Review   CBC w Diff:  Lab Results  Component Value Date   WBC 10.9 (H) 05/21/2018   HGB 12.0 (L) 05/21/2018   HGB 16.3 09/27/2013   HCT 35.8 (L) 05/21/2018   HCT 48.0 09/27/2013   PLT 209 05/21/2018   PLT 182 09/27/2013   LYMPHOPCT 6 05/19/2018   LYMPHOPCT 11.8 09/27/2013   MONOPCT 6 05/19/2018   MONOPCT 10.1 09/27/2013   EOSPCT 0 05/19/2018   EOSPCT 1.2 09/27/2013   BASOPCT 0 05/19/2018   BASOPCT 0.3 09/27/2013    CMP:  Lab Results  Component Value Date   NA 138 05/21/2018   NA 136 09/27/2013   K 3.5 05/21/2018   K 3.4 (L) 09/27/2013   CL 103 05/21/2018   CL 99 09/27/2013   CO2 24 05/21/2018   CO2 32 09/27/2013   BUN 14 05/21/2018   BUN 11 09/27/2013   CREATININE 0.58 (L) 05/21/2018   CREATININE 0.88 09/27/2013   PROT 7.7 05/19/2018   PROT 7.7 09/27/2013   ALBUMIN 3.8 05/19/2018   ALBUMIN 3.4 09/27/2013   BILITOT 0.7 05/19/2018    BILITOT 0.5 09/27/2013   ALKPHOS 53 05/19/2018   ALKPHOS 82 09/27/2013   AST 19 05/19/2018   AST 19 09/27/2013   ALT 10 05/19/2018   ALT 20 09/27/2013  .   Total Time in preparing paper work, data evaluation and todays exam - 84 minutes  Epifanio Lesches M.D on 05/22/2018 at 1:33 PM    Note: This dictation was prepared with Dragon dictation along with smaller phrase technology. Any transcriptional errors that result from this process are unintentional.

## 2018-05-29 NOTE — Progress Notes (Signed)
PEGGY, MONK ([id number]) Visit Report for 05/24/2018 Chief Complaint Document Details Patient Name: [name], [name] Date of Service: 05/24/2018 8:30 AM Medical Record Number: [id number] Patient Account Number: [id number] Date of Birth/Sex: Oct 18, 1950 (67 y.o. M) Treating RN: [name] Primary Care Provider: [name] Other Clinician: Referring Provider: [name] Treating Provider/Extender: [name], HOYT Weeks in Treatment: 25 Information Obtained from: Patient Chief Complaint Right gluteal fold ulcer Electronic Signature(s) Signed: 05/25/2018 11:39:33 AM By: Worthy Keeler [name] Entered By: Worthy Keeler on 05/24/2018 08:46:43 [name], [name] ([id number]) -------------------------------------------------------------------------------- HPI Details Patient Name: [name] Date of Service: 05/24/2018 8:30 AM Medical Record Number: [id number] Patient Account Number: [id number] Date of Birth/Sex: 1950-09-25 (67 y.o. 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 He states that there is no history of diabetes that he is aware of. He does have issues with his prostate and is currently receiving treatment for this by way of oral medication. With that being said I do not have a lot of details in that regard. Nonetheless the patient presents today as a result of having been referred to [place] by another provider initially home health was set to come out and take care of his wound although due to the fact that he  apparently drives he's not able to receive home health. His wife is therefore trying to help take care of this wound within although they have been struggling with what exactly to do at this point. She states that she can do some things but she is definitely not a nurse and does have some issues with looking at blood. The good news is the wound does not appear to be too deep and is fairly superficial at this point. There is no slough noted there is some nonviable skin noted around the surface of the wound and the perimeter at this point. The central portion of the wound appears to be very good with a dermal layer noted this does not appear to be again deep enough to extend it to subcutaneous tissue at this point. 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 In the pump he has Dilaudid and baclofen. 12/07/17 on evaluation today patient presents for follow-up concerning his ongoing lower back thigh ulcer on the right. He states that he did not get the supplies ordered and therefore has not really been able to perform the dressing changes as directed exactly. His wife was able to get some Boarder Foam Dressing's from the drugstore and subsequently has been using hydrogel which did help to a degree in the wound does appear to be able smaller. There is actually more drainage this week noted than previous. 12/21/17 on evaluation today patient appears to be doing rather well in regard to his right gluteal ulcer. He has been tolerating the dressing changes without complication. There does not appear to be any evidence of infection at this point in time. Overall the wound does seem to be making some progress as far as the edges  are concerned there's not as much in the way of overlapping of the external wound edges and he has a good epithelium to wound bed border for the most part. This however is not true  right at the 12 o'clock location over the span of a little over a centimeters which actually will require debridement today to clean this away and hopefully allow it to continue to heal more appropriately. 12/28/17 on evaluation today patient appears to be doing rather well in regard to his ulcer in the left gluteal region. He's been tolerating the dressing changes without complication. Apparently he has had some difficulty getting his dressing material. Apparently there's been some confusion with ordering we're gonna check into this. Nonetheless overall he's been showing signs of improvement which is good news. Debridement is not required today. 01/04/18 on evaluation today patient presents for follow-up concerning his right gluteal ulcer. He has been tolerating the dressing changes fairly well. On inspection today it appears he may actually have some maceration them concerned about the fact that he may be developing too much moisture in and around the wound bed which can cause delay in healing. With that being said he unfortunately really has not showed significant signs of improvement since last week's evaluation in fact this may even be just the little bit/slightly larger. Nonetheless he's been having a lot of discomfort I'm not sure this is even related to the wound as he has no pain when I'm to breeding or otherwise cleaning the wound during evaluation today. Nonetheless this is something that we did recommend he talked to his pain specialist concerning. 01/11/18 on evaluation today patient appears to be doing better in regard to his ulceration. He has been tolerating the dressing changes without complication. With that being said overall there's no evidence of infection which is good news. The only thing is he did receive the hatch affair blue classic versus the ready nonetheless I feel like this is perfectly fine and appears to have done well for him over the past week. 01/25/18 on evaluation  today patient's wound actually appears to be a little bit larger than during the last evaluation. The good [name], [name]. ([id number]) news is the majority of the wound edges actually appear to be fairly firmly attached to the wound bed unfortunately again we're not really making progress in regard to the size. Roughly the wound is about the same size as when I first saw him although again the wound margin/edges appear to be much better. 02/01/18 on evaluation today patient actually appears to be doing very well in regard to his wound. Applying the Prisma dry does seem to be better although he does still have issues with slow progression of the wound. There was a slight improvement compared to last week's measurements today. Nonetheless I have been considering other options as far as the possibility of Theraskin or even a snap vac. In general I'm not sure that the Theraskin due to location of the wound would be a very good idea. Nonetheless I do think that a snap vac could be a possibility for the patient and in fact I think this could even be an excellent way to manage the wound possibly seeing some improvement in a very rapid fashion here. Nonetheless this is something that we would need to get approved and I did have a lengthy conversation with the patient about this today. 02/08/18 on evaluation today patient appears to be doing a little better in regard to his ulcer.  He has been tolerating the dressing changes without complication. Fortunately despite the fact that the wound is a little bit smaller it's not significantly so unfortunately. We have discussed the possibility of a snap vac we did check with insurance this is actually covered at this point. Fortunately there does not appear to be any sign of infection. Overall I'm fairly pleased with how things seem to be appearing at this point. 02/15/18 on evaluation today patient appears to be doing rather well in regard to his right gluteal  ulcer. Unfortunately the snap vac did not stay in place with his sheer and friction this came loose and did not seem to maintain seal very well. He worked for about two days and it did seem to do very well during that time according to his wife but in general this does not seem to be something that's gonna be beneficial for him long-term. I do believe we need to go back to standard dressings to see if we can find something that will be of benefit. 03/02/18- He is here in follow up evaluation; there is minimal change in the wound. He will continue with the same treatment plan, would consider changing to iodosrob/iodoflex if ulcer continues to to plateau. He will follow up next week 03/08/18 on evaluation today patient's wound actually appears to be about the same size as when I previously saw him several weeks back. Unfortunately he does have some slightly dark discoloration in the central portion of the wound which has me concerned about pressure injury. I do believe he may be sitting for too long a period of time in fact he tells me that "I probably sit for much too long". He does have some Slough noted on the surface of the wound and again as far as the size of the wound is concerned I'm really not seeing anything that seems to have improved significantly. 03/15/18 on evaluation today patient appears to be doing fairly well in regard to his ulcer. The wound measured pretty much about the same today compared to last week's evaluation when looking at his graph. With that being said the area of bruising/deep tissue injury that was noted last week I do not see at this point. He did get a new cushion fortunately this does seem to be have been of benefit in my pinion. It does appear that he's been off of this more which is good news as well I think that is definitely showing in the overall wound measurements. With that being said I do believe that he needs to continue to offload I don't think that the fact  this is doing better should be or is going to allow him to not have to offload and explain this to him as well. Overall he seems to be in agreement the plan I think he understands. The overall appearance of the wound bed is improved compared to last week I think the Iodoflex has been beneficial in that regard. 03/29/18 on evaluation today patient actually appears to be doing rather well in regard to his wound from the overall appearance standpoint he does have some granulation although there's some Slough on the surface of the wound noted as well. With that being said he unfortunately has not improved in regard to the overall measurement of the wound in volume or in size. I did have a discussion with him very specifically about offloading today. He actually does work although he mainly is just sitting throughout the day. He tells me  he offloads by "lifting himself up for 30 seconds off of his chair occasionally" purchase from [employer] which does seem to have helped. And he has a new cushion that he with that being said he's also able to stand some for a very short period of time but not significant enough I think to provide appropriate offloading. I think the biggest issue at this point with the wound and the fact is not healing as quickly as we would like is due to the fact that he is really not able to appropriately offload while at work. He states the beginning after his injury he actually had a bed at his job that he could lay on in order to offload and that does seem to have been of help back at that time. Nonetheless he had not done this in quite some time unfortunately. I think that could be helpful for him this is something I would like for him to look into. 04/05/18 on evaluation today patient actually presents for follow-up concerning his right gluteal ulcer. Again he really is not significantly improved even compared to last week. He has been tolerating the dressing changes without  complication. With that being said fortunately there appears to be no evidence of infection at this time. He has been more proactive in trying to offload. [name], [name] ([id number]) 04/12/18 on evaluation today patient actually appears to be doing a little better in regard to his wound and the right gluteal fold region. He's been tolerating the dressing changes since removing the oasis without complication. However he was having a lot of burning initially with the oasis in place. He's unsure of exactly why this was given so much discomfort but he assumes that it was the oasis itself causing the problem. Nonetheless this had to be removed after about three days in place although even those three days seem to have made a fairly good improvement in regard to the overall appearance of the wound bed. In fact is the first time that he's made any improvement from the standpoint of measurements in about six weeks. He continues to have no discomfort over the area of the wound itself which leads me to wonder why he was having the burning with the oasis when he does not even feel the actual debridement's themselves. I am somewhat perplexed by this. 04/19/18 on evaluation today patient's wound actually appears to be showing signs of epithelialization around the edge of the wound and in general actually appears to be doing better which is good news. He did have the same burning after about three days with applying the Endoform last week in the same fashion that I would generally apply a skin substitute. This seems to indicate that it's not the oasis to cause the problem but potentially the moisture buildup that just causes things to burn or there may be some other reaction with the skin prep or Steri-Strips. Nonetheless I'm not sure that is gonna be able to tolerate any skin substitute for a long period of time. The good news is the wound actually appears to be doing better today compared to last week and  does seem to finally be making some progress. 04/26/18 on evaluation today patient actually appears to be doing rather well in regard to his ulcer in the right gluteal fold. He has been tolerating the dressing changes without complication which is good news. The Endoform does seem to be helping although he was a little bit more macerated this week. This  seems to be an ongoing issue with fluid control at this point. Nonetheless I think we may be able to add something like Drawtex to help control the drainage. 05/03/18 on evaluation today patient appears to actually be doing better in regard to the overall appearance of his wound. He has been tolerating the dressing changes without complication. Fortunately there appears to be no evidence of infection at this time. I really feel like his wound has shown signs as of today of turning around last week I thought so as well and definitely he could be seen in this week's overall appearance and measurements. In general I'm very pleased with the fact that he finally seems to be making a steady but sure progress. The patient likewise is very pleased. 05/17/18 on evaluation today patient appears to be doing more poorly unfortunately in regard to his ulcer. He has been tolerating the dressing changes without complication. With that being said he tells me that in the past couple of days he and his wife have noticed that we did not seem to be doing quite as well is getting dark near the center. Subsequently upon evaluation today the wound actually does appear to be doing worse compared to previous. He has been tolerating the dressing changes otherwise and he states that he is not been sitting up anymore than he was in the past from what he tells me. Still he has continued to work he states "I'm tired of dealing with this and if I have to just go home and lay in the bed all the time that's what I'll do". Nonetheless I am concerned about the fact that this wound does  appear to be deeper than what it was previous. 05/24/18 upon evaluation today patient actually presents after having been in the hospital due to what was presumed to be sepsis secondary to the wound infection. He had an elevated white blood cell count between 14 and 15. With that being said he does seem to be doing somewhat better now. His wound still is giving him some trouble nonetheless and he is obviously concerned about the fact likely talked about that this does seem to go more deeply than previously noted. I did review his wound culture which showed evidence of Staphylococcus aureus him and group B strep. Nonetheless he is on antibiotics, Levaquin, for this. Subsequently I did review his intake summary from the hospital as well. I also did look at the CT of the lumbar spine with contrast that was performed which showed no bone destruction to suggest lumbar disguises/osteomyelitis or sacral osteomyelitis. There was no paraspinal abscess. Nonetheless it appears this may have been more of just a soft tissue infection at this point which is good news. He still is nonetheless concerned about the wound which again I think is completely reasonable considering everything he's been through recently. Electronic Signature(s) Signed: 05/25/2018 11:39:33 AM By: Worthy Keeler [name] Entered By: Worthy Keeler on 05/24/2018 18:21:07 [name], [name] ([id number]) -------------------------------------------------------------------------------- Physical Exam Details Patient Name: [name] Date of Service: 05/24/2018 8:30 AM Medical Record Number: [id number] Patient Account Number: [id number] Date of Birth/Sex: 09/03/1950 (67 y.o. M) Treating RN: [name] Primary Care Provider: [name] Other Clinician: Referring Provider: [name] Treating Provider/Extender: [name], HOYT Weeks in Treatment: 81 Constitutional Well-nourished and well-hydrated in no acute  distress. Respiratory normal breathing without difficulty. clear to auscultation bilaterally. Cardiovascular regular rate and rhythm with normal S1, S2. Psychiatric this patient is able to make  decisions and demonstrates good insight into disease process. Alert and Oriented x 3. pleasant and cooperative. Notes Patient's wound bed currently shows evidence of good granulation although there is some Slough noted on the surface of the wound. I four went any sharp debridement today due to the fact that he's been treated for an active infection I did not want to worsen anything in that regard. Other than this everything seems to be progressing quite well all things considered. Electronic Signature(s) Signed: 05/25/2018 11:39:33 AM By: Worthy Keeler [name] Entered By: Worthy Keeler on 05/24/2018 18:22:19 [name], [name] ([id number]) -------------------------------------------------------------------------------- Physician Orders Details Patient Name: [name] Date of Service: 05/24/2018 8:30 AM Medical Record Number: [id number] Patient Account Number: [id number] Date of Birth/Sex: Apr 02, 1951 (67 y.o. 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 Primary Wound Dressing Wound #1 Right Gluteal fold o Santyl  Ointment Secondary Dressing Wound #1 Right Gluteal fold o Boardered Foam Dressing Dressing Change Frequency Wound #1 Right Gluteal fold o Change dressing every day. Follow-up Appointments Wound #1 Right Gluteal fold o Return Appointment in 1 week. Off-Loading Wound #1 Right Gluteal fold o Turn and reposition every 2 hours Medications-please add to medication list. Wound #1 Right Gluteal fold o P.O. Antibiotics - Levaquin o Santyl Enzymatic Ointment [name], [name] ([id number]) Electronic Signature(s) Signed: 05/25/2018 11:39:33 AM By: Worthy Keeler [name] Signed: 05/27/2018 10:02:59 AM By: Gretta Cool, BSN, RN, CWS, Kim RN, BSN Entered By: Gretta Cool, BSN, RN, CWS, Kim on 05/24/2018 09:00:38 [name], [name] ([id number]) -------------------------------------------------------------------------------- Problem List Details Patient Name: [name] Date of Service: 05/24/2018 8:30 AM Medical Record Number: [id number] Patient Account Number: [id number] Date of Birth/Sex: Jan 19, 1951 (67 y.o. 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 He states that there is no history of diabetes that he is aware of. He does have issues with his prostate and is currently receiving treatment for this by way of oral medication. With that being said I do not have a lot of details in that regard. Nonetheless the patient presents today as a result of having been referred to [place] by another provider initially home health was set to come out and take care of his wound although due to the fact that he apparently drives he's not able to receive home health. His wife is therefore trying to help take care of this wound within although they have been struggling with what exactly to do at this point. She states that she can do some things but she is definitely not a nurse and does have some issues with looking at blood. The good news is the wound does not appear to be too deep and is fairly superficial at this point. There is no slough noted there is some nonviable skin noted around the surface of the wound and the perimeter at this point. The central portion of the wound appears to be very good with a dermal layer noted this does not appear to be again deep enough to extend it to subcutaneous tissue at this point. Overall the patient for a paraplegic  seems to be functioning fairly well he does have both a spinal cord stimulator as well is the intrathecal pump. In the pump he has Dilaudid and baclofen. 12/07/17 on evaluation today patient presents for follow-up concerning his ongoing lower back thigh ulcer on the right. He states that he did not get the supplies ordered and therefore has not really been able to perform the dressing changes as directed exactly. His wife was able to get some Boarder Foam Dressing's from the drugstore and subsequently has been using hydrogel which did help to a degree in the wound does appear to be able smaller. There is actually more drainage this week noted than previous. 12/21/17 on evaluation today patient appears to be doing rather well in regard to his right gluteal ulcer. He has been tolerating the dressing changes without complication. There does not appear to be any evidence of infection at this point in time. Overall the wound does seem to be making some progress as far as the edges are concerned there's not as much in the way of overlapping of the external wound edges and he has a good epithelium to wound bed border for the most part. This however is not true right at the 12 o'clock location over the span of a little over a centimeters which actually will require debridement today to clean this away and hopefully allow it to continue to heal more appropriately. 12/28/17 on evaluation today patient appears to be doing rather well in regard to his ulcer in the left gluteal region. He's been tolerating the dressing changes without complication. Apparently he has had some difficulty getting his dressing material. Apparently there's been some confusion with ordering we're gonna check into this. Nonetheless overall he's been showing signs of improvement which is good news. Debridement is not required today. 01/04/18 on evaluation today patient presents for follow-up concerning his right gluteal ulcer. He has been  tolerating the dressing changes fairly well. On inspection today it appears he may actually have some maceration them concerned about the fact that he may be developing too much moisture in and around the wound bed which can cause delay in healing.  With that being said he unfortunately really has not showed significant signs of improvement since last week's evaluation in fact this may even be just the little bit/slightly larger. Nonetheless he's been having a lot of discomfort I'm not sure this is even related to the wound as he has no pain when I'm to breeding or otherwise cleaning the wound during evaluation today. Nonetheless this is something that we did recommend he talked to his pain specialist concerning. 01/11/18 on evaluation today patient appears to be doing better in regard to his ulceration. He has been tolerating the dressing [name], [name] ([id number]) changes without complication. With that being said overall there's no evidence of infection which is good news. The only thing is he did receive the hatch affair blue classic versus the ready nonetheless I feel like this is perfectly fine and appears to have done well for him over the past week. 01/25/18 on evaluation today patient's wound actually appears to be a little bit larger than during the last evaluation. The good news is the majority of the wound edges actually appear to be fairly firmly attached to the wound bed unfortunately again we're not really making progress in regard to the size. Roughly the wound is about the same size as when I first saw him although again the wound margin/edges appear to be much better. 02/01/18 on evaluation today patient actually appears to be doing very well in regard to his wound. Applying the Prisma dry does seem to be better although he does still have issues with slow progression of the wound. There was a slight improvement compared to last week's measurements today. Nonetheless I have been  considering other options as far as the possibility of Theraskin or even a snap vac. In general I'm not sure that the Theraskin due to location of the wound would be a very good idea. Nonetheless I do think that a snap vac could be a possibility for the patient and in fact I think this could even be an excellent way to manage the wound possibly seeing some improvement in a very rapid fashion here. Nonetheless this is something that we would need to get approved and I did have a lengthy conversation with the patient about this today. 02/08/18 on evaluation today patient appears to be doing a little better in regard to his ulcer. He has been tolerating the dressing changes without complication. Fortunately despite the fact that the wound is a little bit smaller it's not significantly so unfortunately. We have discussed the possibility of a snap vac we did check with insurance this is actually covered at this point. Fortunately there does not appear to be any sign of infection. Overall I'm fairly pleased with how things seem to be appearing at this point. 02/15/18 on evaluation today patient appears to be doing rather well in regard to his right gluteal ulcer. Unfortunately the snap vac did not stay in place with his sheer and friction this came loose and did not seem to maintain seal very well. He worked for about two days and it did seem to do very well during that time according to his wife but in general this does not seem to be something that's gonna be beneficial for him long-term. I do believe we need to go back to standard dressings to see if we can find something that will be of benefit. 03/02/18- He is here in follow up evaluation; there is minimal change in the wound. He will continue with the  same treatment plan, would consider changing to iodosrob/iodoflex if ulcer continues to to plateau. He will follow up next week 03/08/18 on evaluation today patient's wound actually appears to be about the  same size as when I previously saw him several weeks back. Unfortunately he does have some slightly dark discoloration in the central portion of the wound which has me concerned about pressure injury. I do believe he may be sitting for too long a period of time in fact he tells me that "I probably sit for much too long". He does have some Slough noted on the surface of the wound and again as far as the size of the wound is concerned I'm really not seeing anything that seems to have improved significantly. 03/15/18 on evaluation today patient appears to be doing fairly well in regard to his ulcer. The wound measured pretty much about the same today compared to last week's evaluation when looking at his graph. With that being said the area of bruising/deep tissue injury that was noted last week I do not see at this point. He did get a new cushion fortunately this does seem to be have been of benefit in my pinion. It does appear that he's been off of this more which is good news as well I think that is definitely showing in the overall wound measurements. With that being said I do believe that he needs to continue to offload I don't think that the fact this is doing better should be or is going to allow him to not have to offload and explain this to him as well. Overall he seems to be in agreement the plan I think he understands. The overall appearance of the wound bed is improved compared to last week I think the Iodoflex has been beneficial in that regard. 03/29/18 on evaluation today patient actually appears to be doing rather well in regard to his wound from the overall appearance standpoint he does have some granulation although there's some Slough on the surface of the wound noted as well. With that being said he unfortunately has not improved in regard to the overall measurement of the wound in volume or in size. I did have a discussion with him very specifically about offloading today. He actually  does work although he mainly is just sitting throughout the day. He tells me he offloads by "lifting himself up for 30 seconds off of his chair occasionally" purchase from [employer] which does seem to have helped. And he has a new cushion that he with that being said he's also able to stand some for a very short period of time but not significant enough I think to provide appropriate offloading. I think the biggest issue at this point with the wound and the fact is not healing as quickly as we would like is due to the fact that he is really not able to appropriately offload while at work. He states the beginning after his injury he actually had a bed at his job that he could lay on in order to offload and that does seem to have been of help back at that time. Nonetheless he had not done this in quite some time unfortunately. I think that could be helpful for him this is something I would like for him to look into. [name], [name] ([id number]) 04/05/18 on evaluation today patient actually presents for follow-up concerning his right gluteal ulcer. Again he really is not significantly improved even compared to last week. He  has been tolerating the dressing changes without complication. With that being said fortunately there appears to be no evidence of infection at this time. He has been more proactive in trying to offload. 04/12/18 on evaluation today patient actually appears to be doing a little better in regard to his wound and the right gluteal fold region. He's been tolerating the dressing changes since removing the oasis without complication. However he was having a lot of burning initially with the oasis in place. He's unsure of exactly why this was given so much discomfort but he assumes that it was the oasis itself causing the problem. Nonetheless this had to be removed after about three days in place although even those three days seem to have made a fairly good improvement in  regard to the overall appearance of the wound bed. In fact is the first time that he's made any improvement from the standpoint of measurements in about six weeks. He continues to have no discomfort over the area of the wound itself which leads me to wonder why he was having the burning with the oasis when he does not even feel the actual debridement's themselves. I am somewhat perplexed by this. 04/19/18 on evaluation today patient's wound actually appears to be showing signs of epithelialization around the edge of the wound and in general actually appears to be doing better which is good news. He did have the same burning after about three days with applying the Endoform last week in the same fashion that I would generally apply a skin substitute. This seems to indicate that it's not the oasis to cause the problem but potentially the moisture buildup that just causes things to burn or there may be some other reaction with the skin prep or Steri-Strips. Nonetheless I'm not sure that is gonna be able to tolerate any skin substitute for a long period of time. The good news is the wound actually appears to be doing better today compared to last week and does seem to finally be making some progress. 04/26/18 on evaluation today patient actually appears to be doing rather well in regard to his ulcer in the right gluteal fold. He has been tolerating the dressing changes without complication which is good news. The Endoform does seem to be helping although he was a little bit more macerated this week. This seems to be an ongoing issue with fluid control at this point. Nonetheless I think we may be able to add something like Drawtex to help control the drainage. 05/03/18 on evaluation today patient appears to actually be doing better in regard to the overall appearance of his wound. He has been tolerating the dressing changes without complication. Fortunately there appears to be no evidence of infection  at this time. I really feel like his wound has shown signs as of today of turning around last week I thought so as well and definitely he could be seen in this week's overall appearance and measurements. In general I'm very pleased with the fact that he finally seems to be making a steady but sure progress. The patient likewise is very pleased. 05/17/18 on evaluation today patient appears to be doing more poorly unfortunately in regard to his ulcer. He has been tolerating the dressing changes without complication. With that being said he tells me that in the past couple of days he and his wife have noticed that we did not seem to be doing quite as well is getting dark near the center. Subsequently upon evaluation  today the wound actually does appear to be doing worse compared to previous. He has been tolerating the dressing changes otherwise and he states that he is not been sitting up anymore than he was in the past from what he tells me. Still he has continued to work he states "I'm tired of dealing with this and if I have to just go home and lay in the bed all the time that's what I'll do". Nonetheless I am concerned about the fact that this wound does appear to be deeper than what it was previous. 05/24/18 upon evaluation today patient actually presents after having been in the hospital due to what was presumed to be sepsis secondary to the wound infection. He had an elevated white blood cell count between 14 and 15. With that being said he does seem to be doing somewhat better now. His wound still is giving him some trouble nonetheless and he is obviously concerned about the fact likely talked about that this does seem to go more deeply than previously noted. I did review his wound culture which showed evidence of Staphylococcus aureus him and group B strep. Nonetheless he is on antibiotics, Levaquin, for this. Subsequently I did review his intake summary from the hospital as well. I also did  look at the CT of the lumbar spine with contrast that was performed which showed no bone destruction to suggest lumbar disguises/osteomyelitis or sacral osteomyelitis. There was no paraspinal abscess. Nonetheless it appears this may have been more of just a soft tissue infection at this point which is good news. He still is nonetheless concerned about the wound which again I think is completely reasonable considering everything he's been through recently. Patient History Information obtained from Patient. Family History Hypertension - Father, Stroke - Mother, No family history of Cancer, Diabetes, Heart Disease, Kidney Disease, Lung Disease, Seizures, Thyroid Problems, Torpey, Vidur J. ([id number]) Tuberculosis. Social History Never smoker, Marital Status - Married, Alcohol Use - Never, Drug Use - No History, Caffeine Use - Daily. Medical And Surgical History Notes Oncologic Prostate cancer- currently treated with horomone therapy Review of Systems (ROS) Constitutional Symptoms (General Health) Denies complaints or symptoms of Fever, Chills. Respiratory The patient has no complaints or symptoms. Cardiovascular The patient has no complaints or symptoms. Psychiatric The patient has no complaints or symptoms. Objective Constitutional Well-nourished and well-hydrated in no acute distress. Vitals Time Taken: 8:20 AM, Height: 73 in, Weight: 210 lbs, BMI: 27.7, Temperature: 98.2 F, Pulse: 91 bpm, Respiratory Rate: 16 breaths/min, Blood Pressure: 136/81 mmHg. Respiratory normal breathing without difficulty. clear to auscultation bilaterally. Cardiovascular regular rate and rhythm with normal S1, S2. Psychiatric this patient is able to make decisions and demonstrates good insight into disease process. Alert and Oriented x 3. pleasant and cooperative. General Notes: Patient's wound bed currently shows evidence of good granulation although there is some Slough noted on the surface of  the wound. I four went any sharp debridement today due to the fact that he's been treated for an active infection I did not want to worsen anything in that regard. Other than this everything seems to be progressing quite well all things considered. Integumentary (Hair, Skin) Wound #1 status is Open. Original cause of wound was Pressure Injury. The wound is located on the Right Gluteal fold. The wound measures 4.2cm length x 3cm width x 0.8cm depth; 9.896cm^2 area and 7.917cm^3 volume. There is Fat Layer (Subcutaneous Tissue) Exposed exposed. There is no tunneling or undermining noted. There is a  medium amount of serous drainage noted. The wound margin is flat and intact. There is small (1-33%) pink granulation within the wound bed. There is a large (67-100%) amount of necrotic tissue within the wound bed including Eschar and Adherent Slough. The periwound skin appearance exhibited: Maceration, Erythema. The periwound skin appearance did not exhibit: Callus, Crepitus, Excoriation, Pickering, Kristoff J. ([id number]) Induration, Rash, Scarring, Dry/Scaly, Atrophie Blanche, Cyanosis, Ecchymosis, Hemosiderin Staining, Mottled, Pallor, Rubor. The surrounding wound skin color is noted with erythema which is circumferential. Periwound temperature was noted as No Abnormality. 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 Primary Wound Dressing: Wound #1 Right Gluteal fold: Santyl Ointment Secondary Dressing: Wound #1 Right Gluteal fold: Boardered Foam Dressing Dressing Change Frequency: Wound #1  Right Gluteal fold: Change dressing every day. Follow-up Appointments: Wound #1 Right Gluteal fold: Return Appointment in 1 week. Off-Loading: Wound #1 Right Gluteal fold: Turn and reposition every 2 hours Medications-please add to medication list.: Wound #1 Right Gluteal fold: P.O. Antibiotics - Levaquin Santyl Enzymatic Ointment I'm gonna recommend at this point that we continue with the above wound care measures for the next week. I will see him in one weeks time to see were things stand. If she's not doing significantly better by that point then I will likely recommend that we proceed with switching the dressing to some degree possible utilizing an Iodoform packing strip to pack the Santyl down into the deeper region of the wound. The patient is in agreement that plan. Nonetheless will see were things stand in one week. [name], [name] ([id number]) Please see above for specific wound care orders. We will see patient for re-evaluation in 1 week(s) here in the clinic. If anything worsens or changes patient will contact our office for additional recommendations. Electronic Signature(s) Signed: 05/25/2018 11:39:33 AM By: Worthy Keeler [name] Entered By: Worthy Keeler on 05/24/2018 18:23:03 LAVONTAY, KIRK ([id number]) -------------------------------------------------------------------------------- ROS/PFSH Details Patient Name: [name] Date of Service: 05/24/2018 8:30 AM Medical Record Number: [id number] Patient Account Number: [id number] Date of Birth/Sex: 10/12/1950 (67 y.o. M) Treating RN: [name] Primary Care Provider: [name] Other Clinician: Referring Provider: [name] Treating Provider/Extender: [name], HOYT Weeks in Treatment: 25 Information Obtained From Patient Wound History Do you currently have one or more open woundso Yes How many open wounds do you currently haveo 1 Approximately how long have you had your woundso 1  month How have you been treating your wound(s) until nowo cream Has your wound(s) ever healed and then re-openedo No Have you had any lab work done in the past montho No Have you tested positive for an antibiotic resistant organism (MRSA, VRE)o No Have you tested positive for osteomyelitis (bone infection)o No Have you had any tests for circulation on your legso No Constitutional Symptoms (General Health) Complaints and Symptoms: Negative for: Fever; Chills Eyes Medical History: Positive for: Cataracts - both removed Negative for: Glaucoma; Optic Neuritis Ear/Nose/Mouth/Throat Medical History: Negative for: Chronic sinus problems/congestion; Middle ear problems Hematologic/Lymphatic Medical History: Negative for: Anemia; Hemophilia; Human Immunodeficiency Virus; Lymphedema Respiratory Complaints and Symptoms: No Complaints or Symptoms Medical History: Negative for: Aspiration; Asthma; Chronic Obstructive Pulmonary  Disease (COPD); Pneumothorax; Sleep Apnea; Tuberculosis Cardiovascular Complaints and Symptoms: No Complaints or Symptoms Medical History: Positive for: Hypertension - takes medication MUKUND, WEINREB. ([id number]) Negative for: Angina; Arrhythmia; Congestive Heart Failure; Coronary Artery Disease; Deep Vein Thrombosis; Hypotension; Myocardial Infarction; Peripheral Arterial Disease; Peripheral Venous Disease; Phlebitis; Vasculitis Gastrointestinal Medical History: Negative for: Cirrhosis ; Colitis; Crohnos; Hepatitis A; Hepatitis B; Hepatitis C Endocrine Medical History: Negative for: Type I Diabetes; Type II Diabetes Genitourinary Medical History: Negative for: End Stage Renal Disease Immunological Medical History: Negative for: Lupus Erythematosus; Raynaudos; Scleroderma Integumentary (Skin) Medical History: Negative for: History of Burn; History of pressure wounds Musculoskeletal Medical History: Negative for: Gout; Rheumatoid Arthritis;  Osteoarthritis; Osteomyelitis Neurologic Medical History: Positive for: Paraplegia - waist down Negative for: Dementia; Neuropathy; Quadriplegia; Seizure Disorder Oncologic Medical History: Negative for: Received Chemotherapy; Received Radiation Past Medical History Notes: Prostate cancer- currently treated with horomone therapy Psychiatric Complaints and Symptoms: No Complaints or Symptoms Medical History: Negative for: Anorexia/bulimia; Confinement Anxiety HBO Extended History Items Eyes: Cataracts Immunizations LADANIAN, KELTER ([id number]) Pneumococcal Vaccine: Received Pneumococcal Vaccination: No Implantable Devices Family and Social History Cancer: No; Diabetes: No; Heart Disease: No; Hypertension: Yes - Father; Kidney Disease: No; Lung Disease: No; Seizures: No; Stroke: Yes - Mother; Thyroid Problems: No; Tuberculosis: No; Never smoker; Marital Status - Married; Alcohol Use: Never; Drug Use: No History; Caffeine Use: Daily; Advanced Directives: Yes (Copy provided); Patient does not want information on Advanced Directives; Living Will: Yes (Copy provided) Physician Affirmation I have reviewed and agree with the above information. Electronic Signature(s) Signed: 05/25/2018 11:39:33 AM By: Worthy Keeler [name] Signed: 05/27/2018 10:02:59 AM By: Gretta Cool, BSN, RN, CWS, Kim RN, BSN Entered By: Worthy Keeler on 05/24/2018 18:21:28 LONNY, EISEN ([id number]) -------------------------------------------------------------------------------- SuperBill Details Patient Name: [name] Date of Service: 05/24/2018 Medical Record Number: [id number] Patient Account Number: [id number] Date of Birth/Sex: [birth year] (67 y.o. 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, sequela I10 Essential (primary) hypertension Facility Procedures CPT4 Code: [id number] Description: [phone] - [facility] W/O ANES NON SELECT Modifier: Quantity: 1 Physician Procedures CPT4 Code: [id number] Description: 81017 - WC PHYS LEVEL 4 - NEW PT ICD-10 Diagnosis Description 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, stage 3 G82.20 Paraplegia, unspecified S34.109S Unspecified injury to unspecified level of lumbar spinal cor I10 Essential (primary)  hypertension Modifier: d, sequela Quantity: 1 Electronic Signature(s) Signed: 05/25/2018 11:39:33 AM By: Worthy Keeler [name] Entered By: Worthy Keeler on 05/24/2018 18:23:17

## 2018-05-29 NOTE — Progress Notes (Signed)
EMRICK, HENSCH ([id number]) Visit Report for 05/24/2018 Arrival Information Details Patient Name: [name], [name] Date of Service: 05/24/2018 8:30 AM Medical Record Number: [id number] Patient Account Number: [id number] Date of Birth/Sex: 1951/05/11 (67 y.o. M) Treating RN: [name] Primary Care Zyah Gomm: [name] Other Clinician: Referring Gwyn Mehring: [name] Treating Eesha Schmaltz/Extender: [name], HOYT Weeks in Treatment: 25 Visit Information History Since Last Visit Added or deleted any medications: Yes Patient Arrived: Wheel Chair Any new allergies or adverse reactions: No Arrival Time: 08:23 Had a fall or experienced change in No Accompanied By: wife activities of daily living that may affect Transfer Assistance: None risk of falls: Patient Identification Verified: Yes Signs or symptoms of abuse/neglect since last visito No Secondary Verification Process Completed: Yes Hospitalized since last visit: Yes Patient Requires Transmission-Based No Implantable device outside of the clinic excluding No Precautions: cellular tissue based products placed in the center Patient Has Alerts: Yes since last visit: Patient Alerts: NOT Has Dressing in Place as Prescribed: Yes Diabetic Pain Present Now: Yes Electronic Signature(s) Signed: 05/24/2018 2:08:51 PM By: Lorine Bears RCP, RRT, CHT Entered By: Lorine Bears on 05/24/2018 08:24:37 JAYLYNN, SIEFERT ([id number]) -------------------------------------------------------------------------------- Encounter Discharge Information Details Patient Name: [name] Date of Service: 05/24/2018 8:30 AM Medical Record Number: [id number] Patient Account Number: [id number] Date of Birth/Sex: [birth year] (67 y.o. M) Treating RN: Montey Hora Primary Care Kavin Weckwerth: [name] Other Clinician: Referring Dazia Lippold: [name] Treating Chistian Kasler/Extender: [name],  HOYT Weeks in Treatment: 25 Encounter Discharge Information Items Discharge Condition: Stable Ambulatory Status: Wheelchair Discharge Destination: Home Transportation: Private Auto Accompanied By: wife Schedule Follow-up Appointment: Yes Clinical Summary of Care: Electronic Signature(s) Signed: 05/24/2018 5:28:11 PM By: Montey Hora Entered By: Montey Hora on 05/24/2018 09:11:07 [name] ([id number]) -------------------------------------------------------------------------------- Lower Extremity Assessment Details Patient Name: [name] Date of Service: 05/24/2018 8:30 AM Medical Record Number: [id number] Patient Account Number: [id number] Date of Birth/Sex: 18-Jun-1951 (67 y.o. M) Treating RN: Montey Hora Primary Care Tony Granquist: [name] Other Clinician: Referring Journei Thomassen: [name] Treating Alexarae Oliva/Extender: [name], HOYT Weeks in Treatment: 25 Electronic Signature(s) Signed: 05/24/2018 5:28:11 PM By: Montey Hora Entered By: Montey Hora on 05/24/2018 08:30:37 GIOVANNI, BIBY ([id number]) -------------------------------------------------------------------------------- Multi Wound Chart Details Patient Name: [name] Date of Service: 05/24/2018 8:30 AM Medical Record Number: [id number] Patient Account Number: [id number] Date of Birth/Sex: 28-Jan-1951 (67 y.o. M) Treating RN: [name] Primary Care Bertin Inabinet: [name] Other Clinician: Referring Ivette Castronova: [name] Treating Mallissa Lorenzen/Extender: [name], HOYT Weeks in Treatment: 25 Vital Signs Height(in): 73 Pulse(bpm): 91 Weight(lbs): 210 Blood Pressure(mmHg): 136/81 Body Mass Index(BMI): 28 Temperature(F): 98.2 Respiratory Rate 16 (breaths/min): Photos: [1:No Photos] [N/A:N/A] Wound Location: [1:Right Gluteal fold] [N/A:N/A] Wounding Event: [1:Pressure Injury] [N/A:N/A] Primary Etiology: [1:Pressure Ulcer] [N/A:N/A] Comorbid History:  [1:Cataracts, Hypertension, Paraplegia] [N/A:N/A] Date Acquired: [1:11/02/2017] [N/A:N/A] Weeks of Treatment: [1:25] [N/A:N/A] Wound Status: [1:Open] [N/A:N/A] Measurements L x W x D [1:4.2x3x0.8] [N/A:N/A] (cm) Area (cm) : [1:9.896] [N/A:N/A] Volume (cm) : [1:7.917] [N/A:N/A] % Reduction in Area: [1:1.60%] [N/A:N/A] % Reduction in Volume: [1:-687.80%] [N/A:N/A] Classification: [1:Category/Stage III] [N/A:N/A] Exudate Amount: [1:Large] [N/A:N/A] Exudate Type: [1:Serous] [N/A:N/A] Exudate Color: [1:amber] [N/A:N/A] Wound Margin: [1:Flat and Intact] [N/A:N/A] Granulation Amount: [1:Small (1-33%)] [N/A:N/A] Granulation Quality: [1:Pink] [N/A:N/A] Necrotic Amount: [1:Large (67-100%)] [N/A:N/A] Necrotic Tissue: [1:Eschar, Adherent Slough] [N/A:N/A] Exposed Structures: [1:Fat Layer (Subcutaneous Tissue) Exposed: Yes Fascia: No Tendon: No Muscle: No Joint: No Bone: No] [N/A:N/A] Epithelialization: [1:None] [N/A:N/A] Periwound Skin Texture: [1:Excoriation: No Induration: No Callus: No  Crepitus: No] [N/A:N/A] Rash: No Scarring: No Periwound Skin Moisture: Maceration: Yes N/A N/A Dry/Scaly: No Periwound Skin Color: Erythema: Yes N/A N/A Atrophie Blanche: No Cyanosis: No Ecchymosis: No Hemosiderin Staining: No Mottled: No Pallor: No Rubor: No Erythema Location: Circumferential N/A N/A Temperature: No Abnormality N/A N/A Tenderness on Palpation: No N/A N/A Wound Preparation: Ulcer Cleansing: N/A N/A Rinsed/Irrigated with Saline Topical Anesthetic Applied: Other: lidocaine 4% Treatment Notes Electronic Signature(s) Signed: 05/27/2018 10:02:59 AM By: Gretta Cool, BSN, RN, CWS, Kim RN, BSN Entered By: Gretta Cool, BSN, RN, CWS, Kim on 05/24/2018 08:57:13 HEATH, TESLER ([id number]) -------------------------------------------------------------------------------- Multi-Disciplinary Care Plan Details Patient Name: [name] Date of Service: 05/24/2018 8:30 AM Medical Record  Number: [id number] Patient Account Number: [id number] Date of Birth/Sex: 1950/12/04 (67 y.o. M) Treating RN: [name] Primary Care Jequan Shahin: [name] Other Clinician: Referring Taras Rask: [name] Treating Wilmary Levit/Extender: [name], HOYT Weeks in Treatment: 25 Active Inactive ` Orientation to the Wound Care Program Nursing Diagnoses: Knowledge deficit related to the wound healing center program Goals: Patient/caregiver will verbalize understanding of the [facility] Date Initiated: 11/30/2017 Target Resolution Date: 12/21/2017 Goal Status: Active Interventions: Provide education on orientation to the wound center Notes: ` Pressure Nursing Diagnoses: Knowledge deficit related to management of pressures ulcers Goals: Patient/caregiver will verbalize understanding of pressure ulcer management Date Initiated: 05/17/2018 Target Resolution Date: 05/28/2018 Goal Status: Active Interventions: Assess: immobility, friction, shearing, incontinence upon admission and as needed Notes: ` Wound/Skin Impairment Nursing Diagnoses: Impaired tissue integrity Goals: Patient/caregiver will verbalize understanding of skin care regimen Date Initiated: 11/30/2017 Target Resolution Date: 12/21/2017 Goal Status: Active Ulcer/skin breakdown will have a volume reduction of 30% by week 4 Date Initiated: 11/30/2017 Target Resolution Date: 12/21/2017 JERRIAN, MELLS ([id number]) Goal Status: Active Interventions: Assess patient/caregiver ability to obtain necessary supplies Assess patient/caregiver ability to perform ulcer/skin care regimen upon admission and as needed Assess ulceration(s) every visit Treatment Activities: Skin care regimen initiated : 11/30/2017 Notes: Electronic Signature(s) Signed: 05/27/2018 10:02:59 AM By: Gretta Cool, BSN, RN, CWS, Kim RN, BSN Entered By: Gretta Cool, BSN, RN, CWS, Kim on 05/24/2018 08:57:07 CAPRI, RABEN  ([id number]) -------------------------------------------------------------------------------- Pain Assessment Details Patient Name: [name] Date of Service: 05/24/2018 8:30 AM Medical Record Number: [id number] Patient Account Number: [id number] Date of Birth/Sex: [birth year] (67 y.o. M) Treating RN: [name] Primary Care Takuma Cifelli: [name] Other Clinician: Referring Jeanne Terrance: [name] Treating Sean Malinowski/Extender: [name], HOYT Weeks in Treatment: 25 Active Problems Location of Pain Severity and Description of Pain Patient Has Paino Yes Site Locations Rate the pain. Current Pain Level: 7 Pain Management and Medication Current Pain Management: Electronic Signature(s) Signed: 05/24/2018 2:08:51 PM By: Lorine Bears RCP, RRT, CHT Signed: 05/27/2018 10:02:59 AM By: Gretta Cool, BSN, RN, CWS, Kim RN, BSN Entered By: Lorine Bears on 05/24/2018 08:24:48 REES, SANTISTEVAN ([id number]) -------------------------------------------------------------------------------- Patient/Caregiver Education Details Patient Name: [name] Date of Service: 05/24/2018 8:30 AM Medical Record Number: [id number] Patient Account Number: [id number] Date of Birth/Gender: Sep 17, 1950 (67 y.o. M) Treating RN: [name] Primary Care Physician: [name] Other Clinician: Referring Physician: [name] Treating Physician/Extender: Sharalyn Ink in Treatment: 25 Education Assessment Education Provided To: Patient Education Topics Provided Wound/Skin Impairment: Handouts: Caring for Your Ulcer Methods: Demonstration, Explain/Verbal Responses: State content correctly Electronic Signature(s) Signed: 05/27/2018 10:02:59 AM By: Gretta Cool, BSN, RN, CWS, Kim RN, BSN Entered By: Gretta Cool, BSN, RN, CWS, Kim on 05/24/2018 09:01:28 SHAN, VALDES  ([id number]) -------------------------------------------------------------------------------- Wound Assessment Details Patient Name: ZACHARIA,  Fredrico J. Date of Service: 05/24/2018 8:30 AM Medical Record Number: [id number] Patient Account Number: [id number] Date of Birth/Sex: Sep 03, 1950 (67 y.o. M) Treating RN: Montey Hora Primary Care Camdyn Beske: [name] Other Clinician: Referring Elizeo Rodriques: [name] Treating Lyle Leisner/Extender: [name], HOYT Weeks in Treatment: 25 Wound Status Wound Number: 1 Primary Etiology: Pressure Ulcer Wound Location: Right Gluteal fold Wound Status: Open Wounding Event: Pressure Injury Comorbid History: Cataracts, Hypertension, Paraplegia Date Acquired: 11/02/2017 Weeks Of Treatment: 25 Clustered Wound: No Photos Wound Measurements Length: (cm) 4.2 Width: (cm) 3 Depth: (cm) 0.8 Area: (cm) 9.896 Volume: (cm) 7.917 % Reduction in Area: 1.6% % Reduction in Volume: -687.8% Epithelialization: None Tunneling: No Undermining: No Wound Description Classification: Category/Stage III Foul Odor A Wound Margin: Flat and Intact Slough/Fibr Exudate Amount: Medium Exudate Type: Serous Exudate Color: amber fter Cleansing: No ino Yes Wound Bed Granulation Amount: Small (1-33%) Exposed Structure Granulation Quality: Pink Fascia Exposed: No Necrotic Amount: Large (67-100%) Fat Layer (Subcutaneous Tissue) Exposed: Yes Necrotic Quality: Eschar, Adherent Slough Tendon Exposed: No Muscle Exposed: No Joint Exposed: No Bone Exposed: No Periwound Skin Texture Texture Color Vanburen, Nagee J. ([id number]) No Abnormalities Noted: No No Abnormalities Noted: No Callus: No Atrophie Blanche: No Crepitus: No Cyanosis: No Excoriation: No Ecchymosis: No Induration: No Erythema: Yes Rash: No Erythema Location: Circumferential Scarring: No Hemosiderin Staining: No Mottled: No Moisture Pallor: No No Abnormalities Noted: No Rubor:  No Dry / Scaly: No Maceration: Yes Temperature / Pain Temperature: No Abnormality Wound Preparation Ulcer Cleansing: Rinsed/Irrigated with Saline Topical Anesthetic Applied: Other: lidocaine 4%, Treatment Notes Wound #1 (Right Gluteal fold) 1. Cleansed with: Clean wound with Normal Saline 2. Anesthetic Topical Lidocaine 4% cream to wound bed prior to debridement 4. Dressing Applied: Santyl Ointment 5. Secondary Dressing Applied Bordered Foam Dressing Saline moistened guaze Electronic Signature(s) Signed: 05/24/2018 4:43:25 PM By: Montey Hora Entered By: Montey Hora on 05/24/2018 16:43:24 RANA, ADORNO ([id number]) -------------------------------------------------------------------------------- Vitals Details Patient Name: [name] Date of Service: 05/24/2018 8:30 AM Medical Record Number: [id number] Patient Account Number: [id number] Date of Birth/Sex: January 10, 1951 (67 y.o. M) Treating RN: [name] Primary Care Renesme Kerrigan: [name] Other Clinician: Referring Moriya Mitchell: [name] Treating Jocelyn Nold/Extender: [name], HOYT Weeks in Treatment: 25 Vital Signs Time Taken: 08:20 Temperature (F): 98.2 Height (in): 73 Pulse (bpm): 91 Weight (lbs): 210 Respiratory Rate (breaths/min): 16 Body Mass Index (BMI): 27.7 Blood Pressure (mmHg): 136/81 Reference Range: 80 - 120 mg / dl Electronic Signature(s) Signed: 05/24/2018 2:08:51 PM By: Lorine Bears RCP, RRT, CHT Entered By: Becky Sax, Amado Nash on 05/24/2018 08:27:48

## 2018-05-31 ENCOUNTER — Encounter: Payer: Medicare Other | Admitting: Physician Assistant

## 2018-05-31 DIAGNOSIS — L89314 Pressure ulcer of right buttock, stage 4: Secondary | ICD-10-CM | POA: Diagnosis not present

## 2018-06-02 NOTE — Progress Notes (Signed)
DORANCE, SPINK ([id number]) Visit Report for 05/31/2018 Chief Complaint Document Details Patient Name: [name], [name] Date of Service: 05/31/2018 1:30 PM Medical Record Number: [id number] Patient Account Number: [id number] Date of Birth/Sex: September 15, 1950 (67 y.o. M) Treating RN: [name] Primary Care Provider: [name] Other Clinician: Referring Provider: [name] Treating Provider/Extender: [name], HOYT Weeks in Treatment: 26 Information Obtained from: Patient Chief Complaint Right gluteal fold ulcer Electronic Signature(s) Signed: 05/31/2018 6:19:34 PM By: Worthy Keeler [name] Entered By: Worthy Keeler on 05/31/2018 14:24:46 BELLAMY, JUDSON ([id number]) -------------------------------------------------------------------------------- Debridement Details Patient Name: [name] Date of Service: 05/31/2018 1:30 PM Medical Record Number: [id number] Patient Account Number: [id number] Date of Birth/Sex: [birth year] (67 y.o. M) Treating RN: [name] Primary Care Provider: [name] Other Clinician: Referring Provider: [name] Treating Provider/Extender: [name], HOYT Weeks in Treatment: 26 Debridement Performed for Wound #1 Right Gluteal fold Assessment: Performed By: Physician [name], HOYT E., [name] Debridement Type: Debridement Level of Consciousness (Pre- Awake and Alert procedure): Pre-procedure Verification/Time Yes - 14:10 Out Taken: Start Time: 14:12 Pain Control: Lidocaine 4% Topical Solution Total Area Debrided (L x W): 4.2 (cm) x 3 (cm) = 12.6 (cm) Tissue and other material Viable, Non-Viable, Slough, Subcutaneous, Slough debrided: Level: Skin/Subcutaneous Tissue Debridement Description: Excisional Instrument: Curette Bleeding: Moderate Hemostasis Achieved: Pressure End Time: 14:14 Procedural Pain: 0 Post Procedural Pain: 0 Response to Treatment: Procedure was tolerated well Level of  Consciousness Awake and Alert (Post-procedure): Post Debridement Measurements of Total Wound Length: (cm) 4.2 Stage: Category/Stage III Width: (cm) 3 Depth: (cm) 0.9 Volume: (cm) 8.906 Character of Wound/Ulcer Post Improved Debridement: Post Procedure Diagnosis Same as Pre-procedure Electronic Signature(s) Signed: 05/31/2018 6:08:50 PM By: Gretta Cool, BSN, RN, CWS, Kim RN, BSN Signed: 05/31/2018 6:19:34 PM By: Worthy Keeler [name] Entered By: Worthy Keeler on 05/31/2018 18:07:35 EDEM, TIEGS ([id number]) -------------------------------------------------------------------------------- HPI Details Patient Name: [name] Date of Service: 05/31/2018 1:30 PM Medical Record Number: [id number] Patient Account Number: [id number] Date of Birth/Sex: 12/24/1950 (67 y.o. 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 He states that there is no history of diabetes that he is aware of. He does have issues with his prostate and is currently receiving treatment for this by way of oral medication. With that being said I do not have a lot of details in that regard. Nonetheless the patient presents today as a result of having been referred to [place] by another provider initially home health was set to come out and take care of his wound although due to the fact that he apparently drives he's not able to receive home health. His wife is therefore trying to help take care of this wound within although they have been struggling with what exactly to do  at this point. She states that she can do some things but she is definitely not a nurse and does have some issues with looking at blood. The good news is the wound does not appear to be too deep and is fairly superficial at this point. There is no slough noted there is some  nonviable skin noted around the surface of the wound and the perimeter at this point. The central portion of the wound appears to be very good with a dermal layer noted this does not appear to be again deep enough to extend it to subcutaneous tissue at this point. 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 In the pump he has Dilaudid and baclofen. 12/07/17 on evaluation today patient presents for follow-up concerning his ongoing lower back thigh ulcer on the right. He states that he did not get the supplies ordered and therefore has not really been able to perform the dressing changes as directed exactly. His wife was able to get some Boarder Foam Dressing's from the drugstore and subsequently has been using hydrogel which did help to a degree in the wound does appear to be able smaller. There is actually more drainage this week noted than previous. 12/21/17 on evaluation today patient appears to be doing rather well in regard to his right gluteal ulcer. He has been tolerating the dressing changes without complication. There does not appear to be any evidence of infection at this point in time. Overall the wound does seem to be making some progress as far as the edges are concerned there's not as much in the way of overlapping of the external wound edges and he has a good epithelium to wound bed border for the most part. This however is not true right at the 12 o'clock location over the span of a little over a centimeters which actually will require debridement today to clean this away and hopefully allow it to continue to heal  more appropriately. 12/28/17 on evaluation today patient appears to be doing rather well in regard to his ulcer in the left gluteal region. He's been tolerating the dressing changes without complication. Apparently he has had some difficulty getting his dressing material. Apparently there's been some confusion with ordering we're gonna check into this. Nonetheless overall he's been showing signs of improvement which is good news. Debridement is not required today. 01/04/18 on evaluation today patient presents for follow-up concerning his right gluteal ulcer. He has been tolerating the dressing changes fairly well. On inspection today it appears he may actually have some maceration them concerned about the fact that he may be developing too much moisture in and around the wound bed which can cause delay in healing. With that being said he unfortunately really has not showed significant signs of improvement since last week's evaluation in fact this may even be just the little bit/slightly larger. Nonetheless he's been having a lot of discomfort I'm not sure this is even related to the wound as he has no pain when I'm to breeding or otherwise cleaning the wound during evaluation today. Nonetheless this is something that we did recommend he talked to his pain specialist concerning. 01/11/18 on evaluation today patient appears to be doing better in regard to his ulceration. He has been tolerating the dressing changes without complication. With that being said overall there's no evidence of infection which is good news. The only thing is he did receive the hatch affair blue classic versus the ready nonetheless I feel like this is perfectly fine and appears to have done well for him over the past week. 01/25/18 on evaluation today patient's wound actually appears to be a little bit larger than during the last evaluation. The good [place], Laymon J. (  [id number]) news is the majority of the wound edges actually  appear to be fairly firmly attached to the wound bed unfortunately again we're not really making progress in regard to the size. Roughly the wound is about the same size as when I first saw him although again the wound margin/edges appear to be much better. 02/01/18 on evaluation today patient actually appears to be doing very well in regard to his wound. Applying the Prisma dry does seem to be better although he does still have issues with slow progression of the wound. There was a slight improvement compared to last week's measurements today. Nonetheless I have been considering other options as far as the possibility of Theraskin or even a snap vac. In general I'm not sure that the Theraskin due to location of the wound would be a very good idea. Nonetheless I do think that a snap vac could be a possibility for the patient and in fact I think this could even be an excellent way to manage the wound possibly seeing some improvement in a very rapid fashion here. Nonetheless this is something that we would need to get approved and I did have a lengthy conversation with the patient about this today. 02/08/18 on evaluation today patient appears to be doing a little better in regard to his ulcer. He has been tolerating the dressing changes without complication. Fortunately despite the fact that the wound is a little bit smaller it's not significantly so unfortunately. We have discussed the possibility of a snap vac we did check with insurance this is actually covered at this point. Fortunately there does not appear to be any sign of infection. Overall I'm fairly pleased with how things seem to be appearing at this point. 02/15/18 on evaluation today patient appears to be doing rather well in regard to his right gluteal ulcer. Unfortunately the snap vac did not stay in place with his sheer and friction this came loose and did not seem to maintain seal very well. He worked for about two days and it did seem  to do very well during that time according to his wife but in general this does not seem to be something that's gonna be beneficial for him long-term. I do believe we need to go back to standard dressings to see if we can find something that will be of benefit. 03/02/18- He is here in follow up evaluation; there is minimal change in the wound. He will continue with the same treatment plan, would consider changing to iodosrob/iodoflex if ulcer continues to to plateau. He will follow up next week 03/08/18 on evaluation today patient's wound actually appears to be about the same size as when I previously saw him several weeks back. Unfortunately he does have some slightly dark discoloration in the central portion of the wound which has me concerned about pressure injury. I do believe he may be sitting for too long a period of time in fact he tells me that "I probably sit for much too long". He does have some Slough noted on the surface of the wound and again as far as the size of the wound is concerned I'm really not seeing anything that seems to have improved significantly. 03/15/18 on evaluation today patient appears to be doing fairly well in regard to his ulcer. The wound measured pretty much about the same today compared to last week's evaluation when looking at his graph. With that being said the area of bruising/deep tissue injury that was noted  last week I do not see at this point. He did get a new cushion fortunately this does seem to be have been of benefit in my pinion. It does appear that he's been off of this more which is good news as well I think that is definitely showing in the overall wound measurements. With that being said I do believe that he needs to continue to offload I don't think that the fact this is doing better should be or is going to allow him to not have to offload and explain this to him as well. Overall he seems to be in agreement the plan I think he understands. The  overall appearance of the wound bed is improved compared to last week I think the Iodoflex has been beneficial in that regard. 03/29/18 on evaluation today patient actually appears to be doing rather well in regard to his wound from the overall appearance standpoint he does have some granulation although there's some Slough on the surface of the wound noted as well. With that being said he unfortunately has not improved in regard to the overall measurement of the wound in volume or in size. I did have a discussion with him very specifically about offloading today. He actually does work although he mainly is just sitting throughout the day. He tells me he offloads by "lifting himself up for 30 seconds off of his chair occasionally" purchase from [employer] which does seem to have helped. And he has a new cushion that he with that being said he's also able to stand some for a very short period of time but not significant enough I think to provide appropriate offloading. I think the biggest issue at this point with the wound and the fact is not healing as quickly as we would like is due to the fact that he is really not able to appropriately offload while at work. He states the beginning after his injury he actually had a bed at his job that he could lay on in order to offload and that does seem to have been of help back at that time. Nonetheless he had not done this in quite some time unfortunately. I think that could be helpful for him this is something I would like for him to look into. 04/05/18 on evaluation today patient actually presents for follow-up concerning his right gluteal ulcer. Again he really is not significantly improved even compared to last week. He has been tolerating the dressing changes without complication. With that being said fortunately there appears to be no evidence of infection at this time. He has been more proactive in trying to offload. CLAUDIS, GIOVANELLI  ([id number]) 04/12/18 on evaluation today patient actually appears to be doing a little better in regard to his wound and the right gluteal fold region. He's been tolerating the dressing changes since removing the oasis without complication. However he was having a lot of burning initially with the oasis in place. He's unsure of exactly why this was given so much discomfort but he assumes that it was the oasis itself causing the problem. Nonetheless this had to be removed after about three days in place although even those three days seem to have made a fairly good improvement in regard to the overall appearance of the wound bed. In fact is the first time that he's made any improvement from the standpoint of measurements in about six weeks. He continues to have no discomfort over the area of the wound itself which  leads me to wonder why he was having the burning with the oasis when he does not even feel the actual debridement's themselves. I am somewhat perplexed by this. 04/19/18 on evaluation today patient's wound actually appears to be showing signs of epithelialization around the edge of the wound and in general actually appears to be doing better which is good news. He did have the same burning after about three days with applying the Endoform last week in the same fashion that I would generally apply a skin substitute. This seems to indicate that it's not the oasis to cause the problem but potentially the moisture buildup that just causes things to burn or there may be some other reaction with the skin prep or Steri-Strips. Nonetheless I'm not sure that is gonna be able to tolerate any skin substitute for a long period of time. The good news is the wound actually appears to be doing better today compared to last week and does seem to finally be making some progress. 04/26/18 on evaluation today patient actually appears to be doing rather well in regard to his ulcer in the right gluteal fold.  He has been tolerating the dressing changes without complication which is good news. The Endoform does seem to be helping although he was a little bit more macerated this week. This seems to be an ongoing issue with fluid control at this point. Nonetheless I think we may be able to add something like Drawtex to help control the drainage. 05/03/18 on evaluation today patient appears to actually be doing better in regard to the overall appearance of his wound. He has been tolerating the dressing changes without complication. Fortunately there appears to be no evidence of infection at this time. I really feel like his wound has shown signs as of today of turning around last week I thought so as well and definitely he could be seen in this week's overall appearance and measurements. In general I'm very pleased with the fact that he finally seems to be making a steady but sure progress. The patient likewise is very pleased. 05/17/18 on evaluation today patient appears to be doing more poorly unfortunately in regard to his ulcer. He has been tolerating the dressing changes without complication. With that being said he tells me that in the past couple of days he and his wife have noticed that we did not seem to be doing quite as well is getting dark near the center. Subsequently upon evaluation today the wound actually does appear to be doing worse compared to previous. He has been tolerating the dressing changes otherwise and he states that he is not been sitting up anymore than he was in the past from what he tells me. Still he has continued to work he states "I'm tired of dealing with this and if I have to just go home and lay in the bed all the time that's what I'll do". Nonetheless I am concerned about the fact that this wound does appear to be deeper than what it was previous. 05/24/18 upon evaluation today patient actually presents after having been in the hospital due to what was presumed to  be sepsis secondary to the wound infection. He had an elevated white blood cell count between 14 and 15. With that being said he does seem to be doing somewhat better now. His wound still is giving him some trouble nonetheless and he is obviously concerned about the fact likely talked about that this does seem to go more  deeply than previously noted. I did review his wound culture which showed evidence of Staphylococcus aureus him and group B strep. Nonetheless he is on antibiotics, Levaquin, for this. Subsequently I did review his intake summary from the hospital as well. I also did look at the CT of the lumbar spine with contrast that was performed which showed no bone destruction to suggest lumbar disguises/osteomyelitis or sacral osteomyelitis. There was no paraspinal abscess. Nonetheless it appears this may have been more of just a soft tissue infection at this point which is good news. He still is nonetheless concerned about the wound which again I think is completely reasonable considering everything he's been through recently. 05/31/18 on evaluation today on evaluation today patient actually appears to be showing signs of his wound be a little bit deeper than what I would like to see. Fortunately he does not show any signs of significant infection although his temperature was 99 today he states he's been checking this at home and has not been elevated. Nonetheless with the undermining that I'm seeing at this point I am becoming more concerned about the wound I do think that offloading is a key factor here that is preventing the speedy recovery at this point. There does not appear to be any evidence of again over infection noted. He's been using Santyl currently. Electronic Signature(s) Signed: 05/31/2018 6:19:34 PM By: Worthy Keeler [name] Entered By: Worthy Keeler on 05/31/2018 18:02:41 MARSH, HECKLER ([id number]) CAMERAN, PETTEY  ([id number]) -------------------------------------------------------------------------------- Physical Exam Details Patient Name: [name] Date of Service: 05/31/2018 1:30 PM Medical Record Number: [id number] Patient Account Number: [id number] Date of Birth/Sex: 03-22-1951 (67 y.o. M) Treating RN: [name] Primary Care Provider: [name] Other Clinician: Referring Provider: [name] Treating Provider/Extender: [name], HOYT Weeks in Treatment: 26 Constitutional Well-nourished and well-hydrated in no acute distress. Respiratory normal breathing without difficulty. clear to auscultation bilaterally. Cardiovascular regular rate and rhythm with normal S1, S2. Psychiatric this patient is able to make decisions and demonstrates good insight into disease process. Alert and Oriented x 3. pleasant and cooperative. Notes Patient's wound bed currently did show some areas of undermining and tunneling that are detailed above. With that being said I do believe that though he is showing signs of no overt infection that he may need to continue with the Levaquin for a period of time yet. Electronic Signature(s) Signed: 05/31/2018 6:19:34 PM By: Worthy Keeler [name] Entered By: Worthy Keeler on 05/31/2018 18:04:01 JEMELL, TOWN ([id number]) -------------------------------------------------------------------------------- Physician Orders Details Patient Name: [name] Date of Service: 05/31/2018 1:30 PM Medical Record Number: [id number] Patient Account Number: [id number] Date of Birth/Sex: June 29, 1951 (67 y.o. 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 Primary Wound Dressing Wound #1 Right Gluteal fold o Silver Collagen Secondary Dressing Wound #1 Right Gluteal fold o Boardered Foam Dressing Dressing Change Frequency Wound #1 Right Gluteal fold o Change dressing every day. Follow-up  Appointments Wound #1 Right Gluteal fold o Return Appointment in 1 week. Off-Loading Wound #1 Right Gluteal fold o Mattress - HHRN order mattress for patient o Turn and reposition every 2 hours Point Blank #1 Right Gluteal fold o [place] to Initiate Wound Vac and appropriate mattress for patient o Home Health Nurse may visit PRN to address patientos wound care needs. DOIS, JUARBE ([id number]) o FACE TO FACE ENCOUNTER: MEDICARE and MEDICAID PATIENTS: I certify that this patient is under my care and that I had a face-to-face encounter that meets the physician face-to-face encounter requirements with this patient on this date. The encounter with the patient was in whole or in part for the following MEDICAL CONDITION: (primary reason for [place]) MEDICAL NECESSITY: I certify, that based on my findings, NURSING services are a medically necessary home health service. HOME BOUND STATUS: I certify that my clinical findings support that this patient is homebound (i.e., Due to illness or injury, pt requires aid of supportive devices such as crutches, cane, wheelchairs, walkers, the use of special transportation or the assistance of another person to leave their place of residence. There is a normal inability to leave the  home and doing so requires considerable and taxing effort. Other absences are for medical reasons / religious services and are infrequent or of short duration when for other reasons). o If current dressing causes regression in wound condition, may D/C ordered dressing product/s and apply Normal Saline Moist Dressing daily until next [place] / Other MD appointment. [place] of regression in wound condition at [phone]. o Please direct any NON-WOUND related issues/requests for orders to patient's Primary Care Physician Negative Pressure Wound Therapy Wound #1 Right Gluteal fold o Wound VAC settings at 125/130 mmHg continuous pressure. Use BLACK/GREEN foam to wound cavity. Use WHITE foam to fill any tunnel/s and/or undermining. Change VAC dressing 3 X WEEK. Change canister as indicated when full. Nurse may titrate settings and frequency of dressing changes as clinically indicated. o Home Health Nurse may d/c VAC for s/s of increased infection, significant wound regression, or uncontrolled drainage. [place] at [phone]. o Number of foam/gauze pieces used in the dressing = Medications-please add to medication list. Wound #1 Right Gluteal fold o P.O. Antibiotics - Complete Levaquin Patient Medications Allergies: penicillin, doxycycline Notifications Medication Indication Start End Levaquin 05/31/2018 DOSE 1 - oral 500 mg tablet - 1 tablet oral taken daily for 10 days Electronic Signature(s) Signed: 05/31/2018 6:05:35 PM By: Worthy Keeler [name] Entered By: Worthy Keeler on 05/31/2018 18:05:35 MIHCAEL, LEDEE ([id number]) -------------------------------------------------------------------------------- Problem List Details Patient Name: [name] Date of Service: 05/31/2018 1:30 PM Medical Record Number: [id number] Patient Account Number: [id number] Date of Birth/Sex: 1951-02-24 (67 y.o. 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 He states that there is no history of diabetes that he is aware of. He does have issues with his prostate and is currently receiving treatment for this by way of oral medication. With that being said I do not have a lot of details in that regard. Nonetheless the patient presents today as a result of having been referred to [place] by  another provider initially home health was set to come out and take care of his wound although due to the fact that he apparently drives he's not able to receive home health. His wife is therefore trying to help take care of this wound within although they have been struggling with what exactly to do at this point. She states that she can do some things but she is definitely not a nurse and does have some issues with looking at blood. The good news is the wound does not appear to be too deep and is fairly superficial at this point. There is no slough noted there is some nonviable skin noted around the surface of the wound and the perimeter at this point. The central portion of the wound appears to be very good with a dermal layer noted this does not appear to be again deep enough to extend it to subcutaneous tissue at this point. 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 In the pump he has Dilaudid and baclofen. 12/07/17 on evaluation today patient presents for follow-up concerning his ongoing lower back thigh ulcer on the right. He states that he did not get the supplies ordered and therefore has not really been able to perform the dressing changes as directed exactly. His wife was able to get some Boarder Foam Dressing's from the drugstore and subsequently has been using hydrogel which did help to a degree in the wound does appear to be able smaller. There is actually more drainage this week noted than previous. 12/21/17 on evaluation today patient appears to be doing rather well in regard to his right gluteal ulcer. He has been tolerating the dressing changes without complication. There does not appear to be any evidence of infection at this point in time. Overall the wound does seem to be making some progress as far as the edges are concerned there's not as much in the way of overlapping of  the external wound edges and he has a good epithelium to wound bed border for the most part. This however is not true right at the 12 o'clock location over the span of a little over a centimeters which actually will require debridement today to clean this away and hopefully allow it to continue to heal more appropriately. 12/28/17 on evaluation today patient appears to be doing rather well in regard to his ulcer in the left gluteal region. He's been tolerating the dressing changes without complication. Apparently  he has had some difficulty getting his dressing material. Apparently there's been some confusion with ordering we're gonna check into this. Nonetheless overall he's been showing signs of improvement which is good news. Debridement is not required today. 01/04/18 on evaluation today patient presents for follow-up concerning his right gluteal ulcer. He has been tolerating the dressing changes fairly well. On inspection today it appears he may actually have some maceration them concerned about the fact that he may be developing too much moisture in and around the wound bed which can cause delay in healing. With that being said he unfortunately really has not showed significant signs of improvement since last week's evaluation in fact this may even be just the little bit/slightly larger. Nonetheless he's been having a lot of discomfort I'm not sure this is even related to the wound as he has no pain when I'm to breeding or otherwise cleaning the wound during evaluation today. Nonetheless this is something that we did recommend he talked to his pain specialist concerning. 01/11/18 on evaluation today patient appears to be doing better in regard to his ulceration. He has been tolerating the dressing Mateus, Yaviel J. ([id number]) changes without complication. With that being said overall there's no evidence of infection which is good news. The only thing is he did receive the hatch affair blue  classic versus the ready nonetheless I feel like this is perfectly fine and appears to have done well for him over the past week. 01/25/18 on evaluation today patient's wound actually appears to be a little bit larger than during the last evaluation. The good news is the majority of the wound edges actually appear to be fairly firmly attached to the wound bed unfortunately again we're not really making progress in regard to the size. Roughly the wound is about the same size as when I first saw him although again the wound margin/edges appear to be much better. 02/01/18 on evaluation today patient actually appears to be doing very well in regard to his wound. Applying the Prisma dry does seem to be better although he does still have issues with slow progression of the wound. There was a slight improvement compared to last week's measurements today. Nonetheless I have been considering other options as far as the possibility of Theraskin or even a snap vac. In general I'm not sure that the Theraskin due to location of the wound would be a very good idea. Nonetheless I do think that a snap vac could be a possibility for the patient and in fact I think this could even be an excellent way to manage the wound possibly seeing some improvement in a very rapid fashion here. Nonetheless this is something that we would need to get approved and I did have a lengthy conversation with the patient about this today. 02/08/18 on evaluation today patient appears to be doing a little better in regard to his ulcer. He has been tolerating the dressing changes without complication. Fortunately despite the fact that the wound is a little bit smaller it's not significantly so unfortunately. We have discussed the possibility of a snap vac we did check with insurance this is actually covered at this point. Fortunately there does not appear to be any sign of infection. Overall I'm fairly pleased with how things seem to  be appearing at this point. 02/15/18 on evaluation today patient appears to be doing rather well in regard to his right gluteal ulcer. Unfortunately the snap vac did not stay in place with  his sheer and friction this came loose and did not seem to maintain seal very well. He worked for about two days and it did seem to do very well during that time according to his wife but in general this does not seem to be something that's gonna be beneficial for him long-term. I do believe we need to go back to standard dressings to see if we can find something that will be of benefit. 03/02/18- He is here in follow up evaluation; there is minimal change in the wound. He will continue with the same treatment plan, would consider changing to iodosrob/iodoflex if ulcer continues to to plateau. He will follow up next week 03/08/18 on evaluation today patient's wound actually appears to be about the same size as when I previously saw him several weeks back. Unfortunately he does have some slightly dark discoloration in the central portion of the wound which has me concerned about pressure injury. I do believe he may be sitting for too long a period of time in fact he tells me that "I probably sit for much too long". He does have some Slough noted on the surface of the wound and again as far as the size of the wound is concerned I'm really not seeing anything that seems to have improved significantly. 03/15/18 on evaluation today patient appears to be doing fairly well in regard to his ulcer. The wound measured pretty much about the same today compared to last week's evaluation when looking at his graph. With that being said the area of bruising/deep tissue injury that was noted last week I do not see at this point. He did get a new cushion fortunately this does seem to be have been of benefit in my pinion. It does appear that he's been off of this more which is good news as well I think that is definitely showing in  the overall wound measurements. With that being said I do believe that he needs to continue to offload I don't think that the fact this is doing better should be or is going to allow him to not have to offload and explain this to him as well. Overall he seems to be in agreement the plan I think he understands. The overall appearance of the wound bed is improved compared to last week I think the Iodoflex has been beneficial in that regard. 03/29/18 on evaluation today patient actually appears to be doing rather well in regard to his wound from the overall appearance standpoint he does have some granulation although there's some Slough on the surface of the wound noted as well. With that being said he unfortunately has not improved in regard to the overall measurement of the wound in volume or in size. I did have a discussion with him very specifically about offloading today. He actually does work although he mainly is just sitting throughout the day. He tells me he offloads by "lifting himself up for 30 seconds off of his chair occasionally" purchase from [employer] which does seem to have helped. And he has a new cushion that he with that being said he's also able to stand some for a very short period of time but not significant enough I think to provide appropriate offloading. I think the biggest issue at this point with the wound and the fact is not healing as quickly as we would like is due to the fact that he is really not able to appropriately offload while at work. He states  the beginning after his injury he actually had a bed at his job that he could lay on in order to offload and that does seem to have been of help back at that time. Nonetheless he had not done this in quite some time unfortunately. I think that could be helpful for him this is something I would like for him to look into. NESTOR, WIENEKE ([id number]) 04/05/18 on evaluation today patient actually presents for  follow-up concerning his right gluteal ulcer. Again he really is not significantly improved even compared to last week. He has been tolerating the dressing changes without complication. With that being said fortunately there appears to be no evidence of infection at this time. He has been more proactive in trying to offload. 04/12/18 on evaluation today patient actually appears to be doing a little better in regard to his wound and the right gluteal fold region. He's been tolerating the dressing changes since removing the oasis without complication. However he was having a lot of burning initially with the oasis in place. He's unsure of exactly why this was given so much discomfort but he assumes that it was the oasis itself causing the problem. Nonetheless this had to be removed after about three days in place although even those three days seem to have made a fairly good improvement in regard to the overall appearance of the wound bed. In fact is the first time that he's made any improvement from the standpoint of measurements in about six weeks. He continues to have no discomfort over the area of the wound itself which leads me to wonder why he was having the burning with the oasis when he does not even feel the actual debridement's themselves. I am somewhat perplexed by this. 04/19/18 on evaluation today patient's wound actually appears to be showing signs of epithelialization around the edge of the wound and in general actually appears to be doing better which is good news. He did have the same burning after about three days with applying the Endoform last week in the same fashion that I would generally apply a skin substitute. This seems to indicate that it's not the oasis to cause the problem but potentially the moisture buildup that just causes things to burn or there may be some other reaction with the skin prep or Steri-Strips. Nonetheless I'm not sure that is gonna be able to tolerate  any skin substitute for a long period of time. The good news is the wound actually appears to be doing better today compared to last week and does seem to finally be making some progress. 04/26/18 on evaluation today patient actually appears to be doing rather well in regard to his ulcer in the right gluteal fold. He has been tolerating the dressing changes without complication which is good news. The Endoform does seem to be helping although he was a little bit more macerated this week. This seems to be an ongoing issue with fluid control at this point. Nonetheless I think we may be able to add something like Drawtex to help control the drainage. 05/03/18 on evaluation today patient appears to actually be doing better in regard to the overall appearance of his wound. He has been tolerating the dressing changes without complication. Fortunately there appears to be no evidence of infection at this time. I really feel like his wound has shown signs as of today of turning around last week I thought so as well and definitely he could be seen in this week's  overall appearance and measurements. In general I'm very pleased with the fact that he finally seems to be making a steady but sure progress. The patient likewise is very pleased. 05/17/18 on evaluation today patient appears to be doing more poorly unfortunately in regard to his ulcer. He has been tolerating the dressing changes without complication. With that being said he tells me that in the past couple of days he and his wife have noticed that we did not seem to be doing quite as well is getting dark near the center. Subsequently upon evaluation today the wound actually does appear to be doing worse compared to previous. He has been tolerating the dressing changes otherwise and he states that he is not been sitting up anymore than he was in the past from what he tells me. Still he has continued to work he states "I'm tired of dealing with this and  if I have to just go home and lay in the bed all the time that's what I'll do". Nonetheless I am concerned about the fact that this wound does appear to be deeper than what it was previous. 05/24/18 upon evaluation today patient actually presents after having been in the hospital due to what was presumed to be sepsis secondary to the wound infection. He had an elevated white blood cell count between 14 and 15. With that being said he does seem to be doing somewhat better now. His wound still is giving him some trouble nonetheless and he is obviously concerned about the fact likely talked about that this does seem to go more deeply than previously noted. I did review his wound culture which showed evidence of Staphylococcus aureus him and group B strep. Nonetheless he is on antibiotics, Levaquin, for this. Subsequently I did review his intake summary from the hospital as well. I also did look at the CT of the lumbar spine with contrast that was performed which showed no bone destruction to suggest lumbar disguises/osteomyelitis or sacral osteomyelitis. There was no paraspinal abscess. Nonetheless it appears this may have been more of just a soft tissue infection at this point which is good news. He still is nonetheless concerned about the wound which again I think is completely reasonable considering everything he's been through recently. 05/31/18 on evaluation today on evaluation today patient actually appears to be showing signs of his wound be a little bit deeper than what I would like to see. Fortunately he does not show any signs of significant infection although his temperature was 99 today he states he's been checking this at home and has not been elevated. Nonetheless with the undermining that I'm seeing at this point I am becoming more concerned about the wound I do think that offloading is a key factor here that is preventing the speedy recovery at this point. There does not appear to be any  evidence of again over infection noted. He's been using Santyl currently. TERRY, BOLOTIN ([id number]) Patient History Information obtained from Patient. Family History Hypertension - Father, Stroke - Mother, No family history of Cancer, Diabetes, Heart Disease, Kidney Disease, Lung Disease, Seizures, Thyroid Problems, Tuberculosis. Social History Never smoker, Marital Status - Married, Alcohol Use - Never, Drug Use - No History, Caffeine Use - Daily. Medical And Surgical History Notes Oncologic Prostate cancer- currently treated with horomone therapy Review of Systems (ROS) Constitutional Symptoms (General Health) Denies complaints or symptoms of Fever, Chills, Marked Weight Change. Respiratory The patient has no complaints or symptoms. Cardiovascular The patient has  no complaints or symptoms. Psychiatric The patient has no complaints or symptoms. Objective Constitutional Well-nourished and well-hydrated in no acute distress. Vitals Time Taken: 1:47 PM, Height: 73 in, Weight: 210 lbs, BMI: 27.7, Temperature: 99.0 F, Pulse: 83 bpm, Respiratory Rate: 16 breaths/min, Blood Pressure: 108/61 mmHg. Respiratory normal breathing without difficulty. clear to auscultation bilaterally. Cardiovascular regular rate and rhythm with normal S1, S2. Psychiatric this patient is able to make decisions and demonstrates good insight into disease process. Alert and Oriented x 3. pleasant and cooperative. General Notes: Patient's wound bed currently did show some areas of undermining and tunneling that are detailed above. With that being said I do believe that though he is showing signs of no overt infection that he may need to continue with the Levaquin for a period of time yet. Integumentary (Hair, Skin) Wound #1 status is Open. Original cause of wound was Pressure Injury. The wound is located on the Right Gluteal fold. The COWEN, PESQUEIRA ([id number]) wound measures 4.2cm length x 3cm  width x 0.8cm depth; 9.896cm^2 area and 7.917cm^3 volume. There is Fat Layer (Subcutaneous Tissue) Exposed exposed. Tunneling has been noted at 6:00 with a maximum distance of 1.8cm. Undermining begins at 3:00 and ends at 6:00 with a maximum distance of 1.2cm. There is a medium amount of serosanguineous drainage noted. The wound margin is flat and intact. There is small (1-33%) pink granulation within the wound bed. There is a large (67-100%) amount of necrotic tissue within the wound bed including Eschar and Adherent Slough. The periwound skin appearance exhibited: Maceration, Erythema. The periwound skin appearance did not exhibit: Callus, Crepitus, Excoriation, Induration, Rash, Scarring, Dry/Scaly, Atrophie Blanche, Cyanosis, Ecchymosis, Hemosiderin Staining, Mottled, Pallor, Rubor. The surrounding wound skin color is noted with erythema which is circumferential. Periwound temperature was noted as No Abnormality. 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 There was a Excisional Skin/Subcutaneous Tissue Debridement with a total area of 12.6 sq cm performed by [name], HOYT E., [name]. With the following instrument(s): Curette to remove Viable and Non-Viable tissue/material. Material removed includes Subcutaneous Tissue and Slough and after achieving pain control using Lidocaine 4% Topical Solution. No specimens were taken. A time out was conducted at 14:10, prior to the start of the procedure. A Moderate amount of bleeding was controlled with Pressure. The procedure was tolerated well with a pain level of 0 throughout and a pain level of 0 following the procedure. Post Debridement Measurements: 4.2cm length x 3cm width x  0.9cm depth; 8.906cm^3 volume. Post debridement Stage noted as Category/Stage III. Character of Wound/Ulcer Post Debridement is improved. Post procedure Diagnosis Wound #1: Same as Pre-Procedure Plan Wound Cleansing: Wound #1 Right Gluteal fold: Cleanse wound with mild soap and water Anesthetic (add to Medication List): Wound #1 Right Gluteal fold: Topical Lidocaine 4% cream applied to wound bed prior to debridement (In Clinic Only). Primary Wound Dressing: Wound #1 Right Gluteal fold: Silver Collagen DICK, HARK. ([id number]) Secondary Dressing: Wound #1 Right Gluteal fold: Boardered Foam Dressing Dressing Change Frequency: Wound #1 Right Gluteal fold: Change dressing every day. Follow-up Appointments: Wound #1 Right Gluteal fold: Return Appointment in 1 week. Off-Loading: Wound #1 Right Gluteal fold: Mattress - HHRN order mattress for patient Turn and reposition every 2 hours Home Health: Wound #1  Right Gluteal fold: Continue Home Health Visits - [place] to Initiate Wound Vac and appropriate mattress for patient Home Health Nurse may visit [facility] to address patient s wound care needs. FACE TO FACE ENCOUNTER: MEDICARE and MEDICAID PATIENTS: I certify that this patient is under my care and that I had a face-to-face encounter that meets the physician face-to-face encounter requirements with this patient on this date. The encounter with the patient was in whole or in part for the following MEDICAL CONDITION: (primary reason for [place]) MEDICAL NECESSITY: I certify, that based on my findings, NURSING services are a medically necessary home health service. HOME BOUND STATUS: I certify that my clinical findings support that this patient is homebound (i.e., Due to illness or injury, pt requires aid of supportive devices such as crutches, cane, wheelchairs, walkers, the use of special transportation or the assistance of another person to leave their place of residence.  There is a normal inability to leave the home and doing so requires considerable and taxing effort. Other absences are for medical reasons / religious services and are infrequent or of short duration when for other reasons). If current dressing causes regression in wound condition, may D/C ordered dressing product/s and apply Normal Saline Moist Dressing daily until next [place] / Other MD appointment. [place] of regression in wound condition at [phone]. Please direct any NON-WOUND related issues/requests for orders to patient's Primary Care Physician Negative Pressure Wound Therapy: Wound #1 Right Gluteal fold: Wound VAC settings at 125/130 mmHg continuous pressure. Use BLACK/GREEN foam to wound cavity. Use WHITE foam to fill any tunnel/s and/or undermining. Change VAC dressing 3 X WEEK. Change canister as indicated when full. Nurse may titrate settings and frequency of dressing changes as clinically indicated. Home Health Nurse may d/c VAC for s/s of increased infection, significant wound regression, or uncontrolled drainage. [place] at [phone]. Number of foam/gauze pieces used in the dressing = Medications-please add to medication list.: Wound #1 Right Gluteal fold: P.O. Antibiotics - Complete Levaquin The following medication(s) was prescribed: Levaquin oral 500 mg tablet 1 1 tablet oral taken daily for 10 days starting 05/31/2018 At this point my suggestion is going to be that we see about the initiation of a Wound VAC. We're also gonna see about getting him an air mattress which I think could be of benefit as well. We will subsequently see were things stand at follow-up. Nonetheless my hope is that the Wound VAC will help this area to feeling appropriately and quickly without this development into a more significant issue. He's in agreement with this plan. We will see were things stand at follow-up. Please see above for  specific wound care orders. We will see patient for re-evaluation in 1 week(s) here in the clinic. If anything worsens or changes patient will contact our office for additional recommendations. Electronic Signature(s) Signed: 05/31/2018 6:19:34 PM By: Conchita Paris, Quimby ([id number]) Entered By: Worthy Keeler on 05/31/2018 18:07:45 LATONYA, KNIGHT ([id number]) -------------------------------------------------------------------------------- ROS/PFSH Details Patient Name: [name] Date of Service: 05/31/2018 1:30 PM Medical Record Number: [id number] Patient Account Number: [id number] Date of Birth/Sex: 10/18/1950 (67 y.o. M) Treating RN: [name] Primary Care Provider: [name] Other Clinician: Referring Provider: [name] Treating Provider/Extender: [name], HOYT Weeks in Treatment: 26 Information Obtained From Patient Wound History Do you currently have one or more open woundso Yes How many open wounds do you currently haveo 1 Approximately  how long have you had your woundso 1 month How have you been treating your wound(s) until nowo cream Has your wound(s) ever healed and then re-openedo No Have you had any lab work done in the past montho No Have you tested positive for an antibiotic resistant organism (MRSA, VRE)o No Have you tested positive for osteomyelitis (bone infection)o No Have you had any tests for circulation on your legso No Constitutional Symptoms (General Health) Complaints and Symptoms: Negative for: Fever; Chills; Marked Weight Change Eyes Medical History: Positive for: Cataracts - both removed Negative for: Glaucoma; Optic Neuritis Ear/Nose/Mouth/Throat Medical History: Negative for: Chronic sinus problems/congestion; Middle ear problems Hematologic/Lymphatic Medical History: Negative for: Anemia; Hemophilia; Human Immunodeficiency Virus; Lymphedema Respiratory Complaints and Symptoms: No  Complaints or Symptoms Medical History: Negative for: Aspiration; Asthma; Chronic Obstructive Pulmonary Disease (COPD); Pneumothorax; Sleep Apnea; Tuberculosis Cardiovascular Complaints and Symptoms: No Complaints or Symptoms Medical History: Positive for: Hypertension - takes medication LENFORD, BEDDOW. ([id number]) Negative for: Angina; Arrhythmia; Congestive Heart Failure; Coronary Artery Disease; Deep Vein Thrombosis; Hypotension; Myocardial Infarction; Peripheral Arterial Disease; Peripheral Venous Disease; Phlebitis; Vasculitis Gastrointestinal Medical History: Negative for: Cirrhosis ; Colitis; Crohnos; Hepatitis A; Hepatitis B; Hepatitis C Endocrine Medical History: Negative for: Type I Diabetes; Type II Diabetes Genitourinary Medical History: Negative for: End Stage Renal Disease Immunological Medical History: Negative for: Lupus Erythematosus; Raynaudos; Scleroderma Integumentary (Skin) Medical History: Negative for: History of Burn; History of pressure wounds Musculoskeletal Medical History: Negative for: Gout; Rheumatoid Arthritis; Osteoarthritis; Osteomyelitis Neurologic Medical History: Positive for: Paraplegia - waist down Negative for: Dementia; Neuropathy; Quadriplegia; Seizure Disorder Oncologic Medical History: Negative for: Received Chemotherapy; Received Radiation Past Medical History Notes: Prostate cancer- currently treated with horomone therapy Psychiatric Complaints and Symptoms: No Complaints or Symptoms Medical History: Negative for: Anorexia/bulimia; Confinement Anxiety HBO Extended History Items Eyes: Cataracts Immunizations TRON, FLYTHE ([id number]) Pneumococcal Vaccine: Received Pneumococcal Vaccination: No Implantable Devices Family and Social History Cancer: No; Diabetes: No; Heart Disease: No; Hypertension: Yes - Father; Kidney Disease: No; Lung Disease: No; Seizures: No; Stroke: Yes - Mother; Thyroid Problems: No;  Tuberculosis: No; Never smoker; Marital Status - Married; Alcohol Use: Never; Drug Use: No History; Caffeine Use: Daily; Advanced Directives: Yes (Copy provided); Patient does not want information on Advanced Directives; Living Will: Yes (Copy provided) Physician Affirmation I have reviewed and agree with the above information. Electronic Signature(s) Signed: 05/31/2018 6:08:50 PM By: Gretta Cool, BSN, RN, CWS, Kim RN, BSN Signed: 05/31/2018 6:19:34 PM By: Worthy Keeler [name] Entered By: Worthy Keeler on 05/31/2018 18:03:09 JAEDON, SILER ([id number]) -------------------------------------------------------------------------------- SuperBill Details Patient Name: [name] Date of Service: 05/31/2018 Medical Record Number: [id number] Patient Account Number: [id number] Date of Birth/Sex: [birth year] (67 y.o. 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, sequela I10 Essential (primary) hypertension Facility Procedures CPT4 Code: 81448185 Description: 99213 - WOUND CARE VISIT-LEV 3 EST PT Modifier: Quantity: 1 CPT4 Code: [id number] Description: 11042 - DEB SUBQ TISSUE 20 SQ CM/< ICD-10 Diagnosis Description 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, stage 3 Modifier: Quantity: 1 Physician Procedures CPT4 Code: [id number] Description: 50277 - WC PHYS LEVEL 4 - EST PT ICD-10 Diagnosis Description 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

## 2018-06-02 NOTE — Progress Notes (Signed)
[name] ([id number]) Visit Report for 05/31/2018 Arrival Information Details Patient Name: [name], [name] Date of Service: 05/31/2018 1:30 PM Medical Record Number: [id number] Patient Account Number: [id number] Date of Birth/Sex: 23-Apr-1951 (67 y.o. M) Treating RN: [name] Primary Care [name]: [name] Other Clinician: Referring [name]: [name] [name], [name] in Treatment: 26 Visit Information History Since Last Visit Added or deleted any medications: No Patient Arrived: Wheel Chair Any new allergies or adverse reactions: No Arrival Time: 13:45 Had a fall or experienced change in No Accompanied By: wife activities of daily living that may affect Transfer Assistance: None risk of falls: Patient Identification Verified: Yes Signs or symptoms of abuse/neglect since last visito No Secondary Verification Process Completed: Yes Hospitalized since last visit: No Patient Requires Transmission-Based No Implantable device outside of the clinic excluding No Precautions: cellular tissue based products placed in the center Patient Has Alerts: Yes since last visit: Patient Alerts: NOT Has Dressing in Place as Prescribed: Yes Diabetic Pain Present Now: Yes Electronic Signature(s) Signed: 05/31/2018 4:50:00 PM By: [name] Entered By: [name] on 05/31/2018 13:46:21 [name] ([id number]) -------------------------------------------------------------------------------- Clinic Level of Care Assessment Details Patient Name: [name] Date of Service: 05/31/2018 1:30 PM Medical Record Number: [id number] Patient Account Number: [id number] Date of Birth/Sex: 1951-05-28 (67 y.o. M) Treating RN: [name] Primary Care Elverda Wendel: [name] Other Clinician: Referring [name]: [name] [name], [name] in Treatment: 26 Clinic Level of Care  Assessment Items TOOL 4 Quantity Score []  - Use when only an EandM is performed on FOLLOW-UP visit 0 ASSESSMENTS - Nursing Assessment / Reassessment []  - Reassessment of Co-morbidities (includes updates in patient status) 0 X- 1 5 Reassessment of Adherence to Treatment Plan ASSESSMENTS - Wound and Skin Assessment / Reassessment X - Simple Wound Assessment / Reassessment - one wound 1 5 []  - 0 Complex Wound Assessment / Reassessment - multiple wounds []  - 0 Dermatologic / Skin Assessment (not related to wound area) ASSESSMENTS - Focused Assessment []  - Circumferential Edema Measurements - multi extremities 0 []  - 0 Nutritional Assessment / Counseling / Intervention []  - 0 Lower Extremity Assessment (monofilament, tuning fork, pulses) []  - 0 Peripheral Arterial Disease Assessment (using hand held doppler) ASSESSMENTS - Ostomy and/or Continence Assessment and Care []  - Incontinence Assessment and Management 0 []  - 0 Ostomy Care Assessment and Management (repouching, etc.) PROCESS - Coordination of Care X - Simple Patient / Family Education for ongoing care 1 15 []  - 0 Complex (extensive) Patient / Family Education for ongoing care X- 1 10 Staff obtains Programmer, systems, Records, Test Results / Process Orders []  - 0 Staff telephones HHA, Nursing Homes / Clarify orders / etc []  - 0 Routine Transfer to another Facility (non-emergent condition) []  - 0 Routine Hospital Admission (non-emergent condition) X- 1 15 New Admissions / Biomedical engineer / Ordering NPWT, Apligraf, etc. []  - 0 Emergency Hospital Admission (emergent condition) X- 1 10 Simple Discharge Coordination [name], [name] []  - 0 Complex (extensive) Discharge Coordination PROCESS - Special Needs []  - Pediatric / Minor Patient Management 0 []  - 0 Isolation Patient Management []  - 0 Hearing / Language / Visual special needs []  - 0 Assessment of Community assistance (transportation, D/C planning,  etc.) []  - 0 Additional assistance / Altered mentation []  - 0 Support Surface(s) Assessment (bed, cushion, seat, etc.) INTERVENTIONS - Wound Cleansing / Measurement X - Simple Wound Cleansing - one wound 1 5 []  -  0 Complex Wound Cleansing - multiple wounds X- 1 5 Wound Imaging (photographs - any number of wounds) []  - 0 Wound Tracing (instead of photographs) X- 1 5 Simple Wound Measurement - one wound []  - 0 Complex Wound Measurement - multiple wounds INTERVENTIONS - Wound Dressings []  - Small Wound Dressing one or multiple wounds 0 X- 1 15 Medium Wound Dressing one or multiple wounds []  - 0 Large Wound Dressing one or multiple wounds []  - 0 Application of Medications - topical []  - 0 Application of Medications - injection INTERVENTIONS - Miscellaneous []  - External ear exam 0 []  - 0 Specimen Collection (cultures, biopsies, blood, body fluids, etc.) []  - 0 Specimen(s) / Culture(s) sent or taken to Lab for analysis []  - 0 Patient Transfer (multiple staff / Civil Service fast streamer / Similar devices) []  - 0 Simple Staple / Suture removal (25 or less) []  - 0 Complex Staple / Suture removal (26 or more) []  - 0 Hypo / Hyperglycemic Management (close monitor of Blood Glucose) []  - 0 Ankle / Brachial Index (ABI) - do not check if billed separately X- 1 5 Vital Signs [name], [name] ([id number]) Has the patient been seen at the hospital within the last three years: Yes Total Score: 95 Level Of Care: New/Established - Level 3 Electronic Signature(s) Signed: 05/31/2018 6:08:50 PM By: [name], BSN, RN, CWS, Kim RN, BSN Entered By: [name], BSN, RN, CWS, Kim on 05/31/2018 17:47:37 [name] ([id number]) -------------------------------------------------------------------------------- Encounter Discharge Information Details Patient Name: [name] Date of Service: 05/31/2018 1:30 PM Medical Record Number: [id number] Patient Account Number: [id number] Date of  Birth/Sex: [birth year] (67 y.o. M) Treating RN: Montey Hora Primary Care Ratasha Fabre: [name] Other Clinician: Referring Sonya Gunnoe: [name] Treating Allison Deshotels/Extender: [name], [name] in Treatment: 40 Encounter Discharge Information Items Discharge Condition: Stable Ambulatory Status: Wheelchair Discharge Destination: Home Transportation: Private Auto Accompanied By: wife Schedule Follow-up Appointment: Yes Clinical Summary of Care: Electronic Signature(s) Signed: 05/31/2018 5:16:22 PM By: Montey Hora Entered By: Montey Hora on 05/31/2018 14:59:36 [name] ([id number]) -------------------------------------------------------------------------------- Lower Extremity Assessment Details Patient Name: [name] Date of Service: 05/31/2018 1:30 PM Medical Record Number: [id number] Patient Account Number: [id number] Date of Birth/Sex: [birth year] (67 y.o. M) Treating RN: [name] Primary Care Deziyah Arvin: [name] Other Clinician: Referring Irie Dowson: [name] Treating Kennedy Bohanon/Extender: [name], [name] in Treatment: 26 Electronic Signature(s) Signed: 05/31/2018 4:50:00 PM By: [name] Entered By: [name] on 05/31/2018 13:52:10 ULYS, FAVIA ([id number]) -------------------------------------------------------------------------------- Multi Wound Chart Details Patient Name: [name] Date of Service: 05/31/2018 1:30 PM Medical Record Number: [id number] Patient Account Number: [id number] Date of Birth/Sex: 04/14/1951 (67 y.o. M) Treating RN: [name] Primary Care Caelyn Route: [name] Other Clinician: Referring Shamus Desantis: [name] Treating Vrinda Heckstall/Extender: [name], [name] in Treatment: 26 Vital Signs Height(in): 73 Pulse(bpm): 83 Weight(lbs): 210 Blood Pressure(mmHg): 108/61 Body Mass Index(BMI): 28 Temperature(F): 99.0 Respiratory Rate 16 (breaths/min): Photos:  [1:No Photos] [N/A:N/A] Wound Location: [1:Right Gluteal fold] [N/A:N/A] Wounding Event: [1:Pressure Injury] [N/A:N/A] Primary Etiology: [1:Pressure Ulcer] [N/A:N/A] Comorbid History: [1:Cataracts, Hypertension, Paraplegia] [N/A:N/A] Date Acquired: [1:11/02/2017] [N/A:N/A] [name] of Treatment: [1:26] [N/A:N/A] Wound Status: [1:Open] [N/A:N/A] Measurements L x W x D [1:4.2x3x1.6] [N/A:N/A] (cm) Area (cm) : [1:9.896] [N/A:N/A] Volume (cm) : [1:15.834] [N/A:N/A] % Reduction in Area: [1:1.60%] [N/A:N/A] % Reduction in Volume: [1:-1475.50%] [N/A:N/A] Starting Position 1 [1:1] (o'clock): Ending Position 1 [1:3] (o'clock): Maximum Distance 1 (cm): [1:0.2] Undermining: [1:Yes] [N/A:N/A] Classification: [1:Category/Stage III] [N/A:N/A] Exudate Amount: [1:Medium] [  N/A:N/A] Exudate Type: [1:Serosanguineous] [N/A:N/A] Exudate Color: [1:red, brown] [N/A:N/A] Wound Margin: [1:Flat and Intact] [N/A:N/A] Granulation Amount: [1:Small (1-33%)] [N/A:N/A] Granulation Quality: [1:Pink] [N/A:N/A] Necrotic Amount: [1:Large (67-100%)] [N/A:N/A] Necrotic Tissue: [1:Eschar, Adherent Slough] [N/A:N/A] Exposed Structures: [1:Fat Layer (Subcutaneous Tissue) Exposed: Yes Fascia: No Tendon: No Muscle: No Joint: No Bone: No] [N/A:N/A] Epithelialization: None N/A N/A Periwound Skin Texture: Excoriation: No N/A N/A Induration: No Callus: No Crepitus: No Rash: No Scarring: No Periwound Skin Moisture: Maceration: Yes N/A N/A Dry/Scaly: No Periwound Skin Color: Erythema: Yes N/A N/A Atrophie Blanche: No Cyanosis: No Ecchymosis: No Hemosiderin Staining: No Mottled: No Pallor: No Rubor: No Erythema Location: Circumferential N/A N/A Temperature: No Abnormality N/A N/A Tenderness on Palpation: No N/A N/A Wound Preparation: Ulcer Cleansing: N/A N/A Rinsed/Irrigated with Saline Topical Anesthetic Applied: Other: lidocaine 4% Treatment Notes Electronic Signature(s) Signed: 05/31/2018 6:08:50 PM  By: [name], BSN, RN, CWS, Kim RN, BSN Entered By: [name], BSN, RN, CWS, Kim on 05/31/2018 14:29:46 MAYA, SCHOLER ([id number]) -------------------------------------------------------------------------------- Multi-Disciplinary Care Plan Details Patient Name: [name] Date of Service: 05/31/2018 1:30 PM Medical Record Number: [id number] Patient Account Number: [id number] Date of Birth/Sex: 1950-08-17 (67 y.o. M) Treating RN: [name] Primary Care Rayhaan Huster: [name] Other Clinician: Referring Johnanna Bakke: [name] Treating Fumiko Cham/Extender: [name], [name] in Treatment: 26 Active Inactive ` Orientation to the Wound Care Program Nursing Diagnoses: Knowledge deficit related to the wound healing center program Goals: Patient/caregiver will verbalize understanding of the [place] Program Date Initiated: 11/30/2017 Target Resolution Date: 12/21/2017 Goal Status: Active Interventions: Provide education on orientation to the wound center Notes: ` Pressure Nursing Diagnoses: Knowledge deficit related to management of pressures ulcers Goals: Patient/caregiver will verbalize understanding of pressure ulcer management Date Initiated: 05/17/2018 Target Resolution Date: 05/28/2018 Goal Status: Active Interventions: Assess: immobility, friction, shearing, incontinence upon admission and as needed Notes: ` Wound/Skin Impairment Nursing Diagnoses: Impaired tissue integrity Goals: Patient/caregiver will verbalize understanding of skin care regimen Date Initiated: 11/30/2017 Target Resolution Date: 12/21/2017 Goal Status: Active Ulcer/skin breakdown will have a volume reduction of 30% by week 4 Date Initiated: 11/30/2017 Target Resolution Date: 12/21/2017 HARVIS, MABUS ([id number]) Goal Status: Active Interventions: Assess patient/caregiver ability to obtain necessary supplies Assess patient/caregiver ability to perform ulcer/skin care  regimen upon admission and as needed Assess ulceration(s) every visit Treatment Activities: Skin care regimen initiated : 11/30/2017 Notes: Electronic Signature(s) Signed: 05/31/2018 6:08:50 PM By: [name], BSN, RN, CWS, Kim RN, BSN Entered By: [name], BSN, RN, CWS, Kim on 05/31/2018 14:29:38 HERMENEGILDO, CLAUSEN ([id number]) -------------------------------------------------------------------------------- Pain Assessment Details Patient Name: [name] Date of Service: 05/31/2018 1:30 PM Medical Record Number: [id number] Patient Account Number: [id number] Date of Birth/Sex: 06-28-1951 (67 y.o. M) Treating RN: [name] Primary Care Davena Julian: [name] Other Clinician: Referring Nahlia Hellmann: [name] Treating Antawn Sison/Extender: [name], [name] in Treatment: 26 Active Problems Location of Pain Severity and Description of Pain Patient Has Paino Yes Site Locations Pain Location: Pain in Ulcers Rate the pain. Current Pain Level: 8 Worst Pain Level: 8 Least Pain Level: 7 Tolerable Pain Level: 5 Pain Management and Medication Current Pain Management: Goals for Pain Management pt c/o pain to wound. enc to see primary prn. Electronic Signature(s) Signed: 05/31/2018 4:50:00 PM By: [name] Entered By: [name] on 05/31/2018 13:47:40 Daigneault, [name] ([id number]) -------------------------------------------------------------------------------- Patient/Caregiver Education Details Patient Name: [name] Date of Service: 05/31/2018 1:30 PM Medical Record Number: [id number] Patient Account Number: [id number] Date of Birth/Gender: [birth year] (67 y.o. M)  Treating RN: Montey Hora Primary Care Physician: [name] Other Clinician: Referring Physician: Tracie [name] Treating Physician/Extender: Sharalyn Ink in Treatment: 43 Education Assessment Education Provided To: Patient and Caregiver Education Topics  Provided Wound/Skin Impairment: Handouts: Other: wound care asordered Methods: Demonstration, Explain/Verbal Responses: State content correctly Electronic Signature(s) Signed: 05/31/2018 5:16:22 PM By: Montey Hora Entered By: Montey Hora on 05/31/2018 15:00:03 [name] ([id number]) -------------------------------------------------------------------------------- Wound Assessment Details Patient Name: [name] Date of Service: 05/31/2018 1:30 PM Medical Record Number: [id number] Patient Account Number: [id number] Date of Birth/Sex: 1951/07/08 (67 y.o. M) Treating RN: [name] Primary Care Fabienne Nolasco: [name] Other Clinician: Referring Mike Hamre: [name] Treating Proctor Carriker/Extender: [name], [name] in Treatment: 26 Wound Status Wound Number: 1 Primary Etiology: Pressure Ulcer Wound Location: Right Gluteal fold Wound Status: Open Wounding Event: Pressure Injury Comorbid History: Cataracts, Hypertension, Paraplegia Date Acquired: 11/02/2017 [name] Of Treatment: 26 Clustered Wound: No Photos Photo Uploaded By: [name] on 05/31/2018 15:12:51 Wound Measurements Length: (cm) 4.2 % Reduction Width: (cm) 3 % Reduction Depth: (cm) 0.8 Epitheliali Area: (cm) 9.896 Tunneling: Volume: (cm) 7.917 Positio Maximum in Area: 1.6% in Volume: -687.8% zation: None Yes n (o'clock): 6 Distance: (cm) 1.8 Undermining: Yes Starting Position (o'clock): 3 Ending Position (o'clock): 6 Maximum Distance: (cm) 1.2 Wound Description Classification: Category/Stage III Foul Odor A Wound Margin: Flat and Intact Slough/Fibr Exudate Amount: Medium Exudate Type: Serosanguineous Exudate Color: red, brown fter Cleansing: No ino Yes Wound Bed Granulation Amount: Small (1-33%) Exposed Structure Granulation Quality: Pink Fascia Exposed: No Boivin, Manraj J. ([id number]) Necrotic Amount: Large (67-100%) Fat Layer (Subcutaneous Tissue) Exposed:  Yes Necrotic Quality: Eschar, Adherent Slough Tendon Exposed: No Muscle Exposed: No Joint Exposed: No Bone Exposed: No Periwound Skin Texture Texture Color No Abnormalities Noted: No No Abnormalities Noted: No Callus: No Atrophie Blanche: No Crepitus: No Cyanosis: No Excoriation: No Ecchymosis: No Induration: No Erythema: Yes Rash: No Erythema Location: Circumferential Scarring: No Hemosiderin Staining: No Mottled: No Moisture Pallor: No No Abnormalities Noted: No Rubor: No Dry / Scaly: No Maceration: Yes Temperature / Pain Temperature: No Abnormality Wound Preparation Ulcer Cleansing: Rinsed/Irrigated with Saline Topical Anesthetic Applied: Other: lidocaine 4%, Treatment Notes Wound #1 (Right Gluteal fold) 1. Cleansed with: Clean wound with Normal Saline 2. Anesthetic Topical Lidocaine 4% cream to wound bed prior to debridement 3. Peri-wound Care: Skin Prep 4. Dressing Applied: Prisma Ag 5. Secondary Dressing Applied Bordered Foam Dressing Notes drawtex Electronic Signature(s) Signed: 05/31/2018 6:08:50 PM By: [name], BSN, RN, CWS, Kim RN, BSN Entered By: [name], BSN, RN, CWS, Kim on 05/31/2018 14:33:02 CEDARIUS, KERSH ([id number]) -------------------------------------------------------------------------------- Vitals Details Patient Name: [name] Date of Service: 05/31/2018 1:30 PM Medical Record Number: [id number] Patient Account Number: [id number] Date of Birth/Sex: [birth year] (67 y.o. M) Treating RN: [name] Primary Care Shyan Scalisi: [name] Other Clinician: Referring Ardel Jagger: [name] Treating Marleena Shubert/Extender: [name], [name] in Treatment: 26 Vital Signs Time Taken: 13:47 Temperature (F): 99.0 Height (in): 73 Pulse (bpm): 83 Weight (lbs): 210 Respiratory Rate (breaths/min): 16 Body Mass Index (BMI): 27.7 Blood Pressure (mmHg): 108/61 Reference Range: 80 - 120 mg / dl Electronic  Signature(s) Signed: 05/31/2018 4:50:00 PM By: [name] Entered By: [name] on 05/31/2018 13:52:02

## 2018-06-07 ENCOUNTER — Encounter: Payer: Medicare Other | Admitting: Physician Assistant

## 2018-06-07 DIAGNOSIS — L89314 Pressure ulcer of right buttock, stage 4: Secondary | ICD-10-CM | POA: Diagnosis not present

## 2018-06-10 NOTE — Progress Notes (Signed)
[name] ([id number]) Visit Report for 06/07/2018 Arrival Information Details Patient Name: [name] Date of Service: 06/07/2018 1:30 PM Medical Record Number: [id number] Patient Account Number: [id number] Date of Birth/Sex: Jan 21, 1951 (67 y.o. M) Treating RN: Secundino Ginger Primary Care Leota Maka: [name] Other Clinician: Referring Ilze Roselli: [name] Treating Aveleen Nevers/Extender: [name], HOYT Weeks in Treatment: 49 Visit Information History Since Last Visit Added or deleted any medications: No Patient Arrived: Wheel Chair Any new allergies or adverse reactions: No Arrival Time: 13:27 Had a fall or experienced change in No Accompanied By: wife activities of daily living that may affect Transfer Assistance: None risk of falls: Patient Identification Verified: Yes Signs or symptoms of abuse/neglect since last visito No Secondary Verification Process Completed: Yes Hospitalized since last visit: No Patient Requires Transmission-Based No Implantable device outside of the clinic excluding No Precautions: cellular tissue based products placed in the center Patient Has Alerts: Yes since last visit: Patient Alerts: NOT Has Dressing in Place as Prescribed: Yes Diabetic Pain Present Now: No Electronic Signature(s) Signed: 06/07/2018 4:14:45 PM By: Secundino Ginger Entered By: Secundino Ginger on 06/07/2018 13:27:54 [name] ([id number]) -------------------------------------------------------------------------------- Clinic Level of Care Assessment Details Patient Name: [name] Date of Service: 06/07/2018 1:30 PM Medical Record Number: [id number] Patient Account Number: [id number] Date of Birth/Sex: Mar 07, 1951 (67 y.o. M) Treating RN: [name] Primary Care Jaser Fullen: [name] Other Clinician: Referring Jahlon Baines: [name] Treating Makhya Arave/Extender: [name], HOYT Weeks in Treatment: 27 Clinic Level of Care Assessment  Items TOOL 4 Quantity Score []  - Use when only an EandM is performed on FOLLOW-UP visit 0 ASSESSMENTS - Nursing Assessment / Reassessment []  - Reassessment of Co-morbidities (includes updates in patient status) 0 []  - 0 Reassessment of Adherence to Treatment Plan ASSESSMENTS - Wound and Skin Assessment / Reassessment X - Simple Wound Assessment / Reassessment - one wound 1 5 []  - 0 Complex Wound Assessment / Reassessment - multiple wounds []  - 0 Dermatologic / Skin Assessment (not related to wound area) ASSESSMENTS - Focused Assessment []  - Circumferential Edema Measurements - multi extremities 0 []  - 0 Nutritional Assessment / Counseling / Intervention []  - 0 Lower Extremity Assessment (monofilament, tuning fork, pulses) []  - 0 Peripheral Arterial Disease Assessment (using hand held doppler) ASSESSMENTS - Ostomy and/or Continence Assessment and Care []  - Incontinence Assessment and Management 0 []  - 0 Ostomy Care Assessment and Management (repouching, etc.) PROCESS - Coordination of Care X - Simple Patient / Family Education for ongoing care 1 15 []  - 0 Complex (extensive) Patient / Family Education for ongoing care X- 1 10 Staff obtains Programmer, systems, Records, Test Results / Process Orders []  - 0 Staff telephones HHA, Nursing Homes / Clarify orders / etc []  - 0 Routine Transfer to another Facility (non-emergent condition) []  - 0 Routine Hospital Admission (non-emergent condition) []  - 0 New Admissions / Biomedical engineer / Ordering NPWT, Apligraf, etc. []  - 0 Emergency Hospital Admission (emergent condition) X- 1 10 Simple Discharge Coordination [name]. ([id number]) []  - 0 Complex (extensive) Discharge Coordination PROCESS - Special Needs []  - Pediatric / Minor Patient Management 0 []  - 0 Isolation Patient Management []  - 0 Hearing / Language / Visual special needs []  - 0 Assessment of Community assistance (transportation, D/C planning, etc.) []  -  0 Additional assistance / Altered mentation []  - 0 Support Surface(s) Assessment (bed, cushion, seat, etc.) INTERVENTIONS - Wound Cleansing / Measurement X - Simple Wound Cleansing - one wound 1 5 []  -  0 Complex Wound Cleansing - multiple wounds X- 1 5 Wound Imaging (photographs - any number of wounds) []  - 0 Wound Tracing (instead of photographs) X- 1 5 Simple Wound Measurement - one wound []  - 0 Complex Wound Measurement - multiple wounds INTERVENTIONS - Wound Dressings X - Small Wound Dressing one or multiple wounds 1 10 []  - 0 Medium Wound Dressing one or multiple wounds []  - 0 Large Wound Dressing one or multiple wounds []  - 0 Application of Medications - topical []  - 0 Application of Medications - injection INTERVENTIONS - Miscellaneous []  - External ear exam 0 []  - 0 Specimen Collection (cultures, biopsies, blood, body fluids, etc.) []  - 0 Specimen(s) / Culture(s) sent or taken to Lab for analysis []  - 0 Patient Transfer (multiple staff / Civil Service fast streamer / Similar devices) []  - 0 Simple Staple / Suture removal (25 or less) []  - 0 Complex Staple / Suture removal (26 or more) []  - 0 Hypo / Hyperglycemic Management (close monitor of Blood Glucose) []  - 0 Ankle / Brachial Index (ABI) - do not check if billed separately X- 1 5 Vital Signs [name], [name] ([id number]) Has the patient been seen at the hospital within the last three years: Yes Total Score: 70 Level Of Care: New/Established - Level 2 Electronic Signature(s) Signed: 06/08/2018 10:18:29 AM By: Gretta Cool, BSN, RN, CWS, Kim RN, BSN Entered By: Gretta Cool, BSN, RN, CWS, Kim on 06/07/2018 14:08:41 [name] ([id number]) -------------------------------------------------------------------------------- Encounter Discharge Information Details Patient Name: [name] Date of Service: 06/07/2018 1:30 PM Medical Record Number: [id number] Patient Account Number: [id number] Date of Birth/Sex:  Sep 12, 1950 (67 y.o. M) Treating RN: [name] Primary Care Shon Indelicato: [name] Other Clinician: Referring Ceniyah Thorp: [name] Treating Roselyn Doby/Extender: [name], HOYT Weeks in Treatment: 94 Encounter Discharge Information Items Discharge Condition: Stable Ambulatory Status: Wheelchair Discharge Destination: Home Transportation: Private Auto Accompanied By: wife Schedule Follow-up Appointment: No Clinical Summary of Care: Patient Declined Electronic Signature(s) Signed: 06/08/2018 10:18:29 AM By: Gretta Cool, BSN, RN, CWS, Kim RN, BSN Entered By: Gretta Cool, BSN, RN, CWS, Kim on 06/07/2018 14:13:31 MAGDALENO, LORTIE ([id number]) -------------------------------------------------------------------------------- Lower Extremity Assessment Details Patient Name: [name] Date of Service: 06/07/2018 1:30 PM Medical Record Number: [id number] Patient Account Number: [id number] Date of Birth/Sex: 14-Dec-1950 (67 y.o. M) Treating RN: Secundino Ginger Primary Care Mialani Reicks: [name] Other Clinician: Referring Crescentia Boutwell: [name] Treating Latrease Kunde/Extender: [name], HOYT Weeks in Treatment: 27 Electronic Signature(s) Signed: 06/07/2018 4:14:45 PM By: Secundino Ginger Entered By: Secundino Ginger on 06/07/2018 13:29:39 LANEY, BAGSHAW ([id number]) -------------------------------------------------------------------------------- Multi Wound Chart Details Patient Name: [name] Date of Service: 06/07/2018 1:30 PM Medical Record Number: [id number] Patient Account Number: [id number] Date of Birth/Sex: [birth year] (67 y.o. M) Treating RN: [name] Primary Care Arrington Yohe: [name] Other Clinician: Referring Verlin Uher: [name] Treating Lorenso Quirino/Extender: [name], HOYT Weeks in Treatment: 27 Vital Signs Height(in): 73 Pulse(bpm): 100 Weight(lbs): 210 Blood Pressure(mmHg): 133/84 Body Mass Index(BMI): 28 Temperature(F): 99.0 Respiratory  Rate 16 (breaths/min): Photos: [1:No Photos] [N/A:N/A] Wound Location: [1:Right Gluteal fold] [N/A:N/A] Wounding Event: [1:Pressure Injury] [N/A:N/A] Primary Etiology: [1:Pressure Ulcer] [N/A:N/A] Comorbid History: [1:Cataracts, Hypertension, Paraplegia] [N/A:N/A] Date Acquired: [1:11/02/2017] [N/A:N/A] Weeks of Treatment: [1:27] [N/A:N/A] Wound Status: [1:Open] [N/A:N/A] Measurements L x W x D [1:4.2x3x1.8] [N/A:N/A] (cm) Area (cm) : [1:9.896] [N/A:N/A] Volume (cm) : [1:17.813] [N/A:N/A] % Reduction in Area: [1:1.60%] [N/A:N/A] % Reduction in Volume: [1:-1672.40%] [N/A:N/A] Classification: [1:Category/Stage III] [N/A:N/A] Exudate Amount: [1:Medium] [N/A:N/A] Exudate Type: [1:Serous] [N/A:N/A] Exudate Color: [  1:amber] [N/A:N/A] Wound Margin: [1:Flat and Intact] [N/A:N/A] Granulation Amount: [1:Small (1-33%)] [N/A:N/A] Granulation Quality: [1:Pink] [N/A:N/A] Necrotic Amount: [1:Large (67-100%)] [N/A:N/A] Necrotic Tissue: [1:Eschar, Adherent Slough] [N/A:N/A] Exposed Structures: [1:Fat Layer (Subcutaneous Tissue) Exposed: Yes Fascia: No Tendon: No Muscle: No Joint: No Bone: No] [N/A:N/A] Epithelialization: [1:None] [N/A:N/A] Periwound Skin Texture: [1:Excoriation: No Induration: No Callus: No Crepitus: No] [N/A:N/A] Rash: No Scarring: No Periwound Skin Moisture: Maceration: Yes N/A N/A Dry/Scaly: No Periwound Skin Color: Erythema: Yes N/A N/A Atrophie Blanche: No Cyanosis: No Ecchymosis: No Hemosiderin Staining: No Mottled: No Pallor: No Rubor: No Erythema Location: Circumferential N/A N/A Temperature: No Abnormality N/A N/A Tenderness on Palpation: No N/A N/A Wound Preparation: Ulcer Cleansing: N/A N/A Rinsed/Irrigated with Saline Topical Anesthetic Applied: Other: lidocaine 4% Treatment Notes Electronic Signature(s) Signed: 06/08/2018 10:18:29 AM By: Gretta Cool, BSN, RN, CWS, Kim RN, BSN Entered By: Gretta Cool, BSN, RN, CWS, Kim on 06/07/2018 14:02:22 TRAVOR, ROYCE ([id number]) -------------------------------------------------------------------------------- Multi-Disciplinary Care Plan Details Patient Name: [name] Date of Service: 06/07/2018 1:30 PM Medical Record Number: [id number] Patient Account Number: [id number] Date of Birth/Sex: 1951-06-03 (67 y.o. M) Treating RN: [name] Primary Care Shelah Heatley: [name] Other Clinician: Referring Dawnyel Leven: [name] Treating Avonell Lenig/Extender: [name], HOYT Weeks in Treatment: 18 Active Inactive ` Orientation to the Wound Care Program Nursing Diagnoses: Knowledge deficit related to the wound healing center program Goals: Patient/caregiver will verbalize understanding of the [place] Program Date Initiated: 11/30/2017 Target Resolution Date: 12/21/2017 Goal Status: Active Interventions: Provide education on orientation to the wound center Notes: ` Pressure Nursing Diagnoses: Knowledge deficit related to management of pressures ulcers Goals: Patient/caregiver will verbalize understanding of pressure ulcer management Date Initiated: 05/17/2018 Target Resolution Date: 05/28/2018 Goal Status: Active Interventions: Assess: immobility, friction, shearing, incontinence upon admission and as needed Notes: ` Wound/Skin Impairment Nursing Diagnoses: Impaired tissue integrity Goals: Patient/caregiver will verbalize understanding of skin care regimen Date Initiated: 11/30/2017 Target Resolution Date: 12/21/2017 Goal Status: Active Ulcer/skin breakdown will have a volume reduction of 30% by week 4 Date Initiated: 11/30/2017 Target Resolution Date: 12/21/2017 OSKAR, CRETELLA ([id number]) Goal Status: Active Interventions: Assess patient/caregiver ability to obtain necessary supplies Assess patient/caregiver ability to perform ulcer/skin care regimen upon admission and as needed Assess ulceration(s) every visit Treatment Activities: Skin care  regimen initiated : 11/30/2017 Notes: Electronic Signature(s) Signed: 06/08/2018 10:18:29 AM By: Gretta Cool, BSN, RN, CWS, Kim RN, BSN Entered By: Gretta Cool, BSN, RN, CWS, Kim on 06/07/2018 14:01:56 HEATH, TESLER ([id number]) -------------------------------------------------------------------------------- Pain Assessment Details Patient Name: [name] Date of Service: 06/07/2018 1:30 PM Medical Record Number: [id number] Patient Account Number: [id number] Date of Birth/Sex: 11-Jun-1951 (67 y.o. M) Treating RN: Secundino Ginger Primary Care Ernesto Zukowski: [name] Other Clinician: Referring Durrel Mcnee: [name] Treating Octa Uplinger/Extender: [name], HOYT Weeks in Treatment: 27 Active Problems Location of Pain Severity and Description of Pain Patient Has Paino No Site Locations Pain Management and Medication Current Pain Management: Goals for Pain Management pt denies any pain at this time. Electronic Signature(s) Signed: 06/07/2018 4:14:45 PM By: Secundino Ginger Entered By: Secundino Ginger on 06/07/2018 13:28:20 [name] ([id number]) -------------------------------------------------------------------------------- Patient/Caregiver Education Details Patient Name: [name] Date of Service: 06/07/2018 1:30 PM Medical Record Number: [id number] Patient Account Number: [id number] Date of Birth/Gender: November 14, 1950 (67 y.o. M) Treating RN: [name] Primary Care Physician: [name] Other Clinician: Referring Physician: [name] Treating Physician/Extender: Sharalyn Ink in Treatment: 6 Education Assessment Education Provided To: Patient Education Topics Provided Wound/Skin Impairment: Handouts: Caring  for Your Ulcer, Other: continue with pressure therapy Methods: Demonstration, Explain/Verbal Responses: State content correctly Electronic Signature(s) Signed: 06/08/2018 10:18:29 AM By: Gretta Cool, BSN, RN, CWS, Kim RN, BSN Entered By:  Gretta Cool, BSN, RN, CWS, Kim on 06/07/2018 14:11:32 CYPRESS, FANFAN ([id number]) -------------------------------------------------------------------------------- Wound Assessment Details Patient Name: [name] Date of Service: 06/07/2018 1:30 PM Medical Record Number: [id number] Patient Account Number: [id number] Date of Birth/Sex: Apr 03, 1951 (67 y.o. M) Treating RN: Secundino Ginger Primary Care Ruth Tully: [name] Other Clinician: Referring Taishaun Levels: [name] Treating Jnaya Butrick/Extender: [name], HOYT Weeks in Treatment: 27 Wound Status Wound Number: 1 Primary Etiology: Pressure Ulcer Wound Location: Right Gluteal fold Wound Status: Open Wounding Event: Pressure Injury Comorbid History: Cataracts, Hypertension, Paraplegia Date Acquired: 11/02/2017 Weeks Of Treatment: 27 Clustered Wound: No Photos Photo Uploaded By: Secundino Ginger on 06/07/2018 14:08:23 Wound Measurements Length: (cm) 4.2 Width: (cm) 3 Depth: (cm) 1.8 Area: (cm) 9.896 Volume: (cm) 17.813 % Reduction in Area: 1.6% % Reduction in Volume: -1672.4% Epithelialization: None Tunneling: No Undermining: No Wound Description Classification: Category/Stage III Foul Odor A Wound Margin: Flat and Intact Slough/Fibr Exudate Amount: Medium Exudate Type: Serous Exudate Color: amber fter Cleansing: No ino Yes Wound Bed Granulation Amount: Small (1-33%) Exposed Structure Granulation Quality: Pink Fascia Exposed: No Necrotic Amount: Large (67-100%) Fat Layer (Subcutaneous Tissue) Exposed: Yes Necrotic Quality: Eschar, Adherent Slough Tendon Exposed: No Muscle Exposed: No Joint Exposed: No Bone Exposed: No Periwound Skin Texture Killam, Jameir J. ([id number]) Texture Color No Abnormalities Noted: No No Abnormalities Noted: No Callus: No Atrophie Blanche: No Crepitus: No Cyanosis: No Excoriation: No Ecchymosis: No Induration: No Erythema: Yes Rash: No Erythema Location:  Circumferential Scarring: No Hemosiderin Staining: No Mottled: No Moisture Pallor: No No Abnormalities Noted: No Rubor: No Dry / Scaly: No Maceration: Yes Temperature / Pain Temperature: No Abnormality Wound Preparation Ulcer Cleansing: Rinsed/Irrigated with Saline Topical Anesthetic Applied: Other: lidocaine 4%, Treatment Notes Wound #1 (Right Gluteal fold) 4. Dressing Applied: Prisma Ag 5. Secondary Dressing Applied Bordered Foam Dressing Electronic Signature(s) Signed: 06/07/2018 4:14:45 PM By: Secundino Ginger Entered By: Secundino Ginger on 06/07/2018 13:45:58 ALVY, ALSOP ([id number]) -------------------------------------------------------------------------------- Vitals Details Patient Name: [name] Date of Service: 06/07/2018 1:30 PM Medical Record Number: [id number] Patient Account Number: [id number] Date of Birth/Sex: Jan 13, 1951 (67 y.o. M) Treating RN: Secundino Ginger Primary Care Paeton Latouche: [name] Other Clinician: Referring Renesmay Nesbitt: [name] Treating Tahjae Clausing/Extender: [name], HOYT Weeks in Treatment: 27 Vital Signs Time Taken: 13:28 Temperature (F): 99.0 Height (in): 73 Pulse (bpm): 100 Weight (lbs): 210 Respiratory Rate (breaths/min): 16 Body Mass Index (BMI): 27.7 Blood Pressure (mmHg): 133/84 Reference Range: 80 - 120 mg / dl Electronic Signature(s) Signed: 06/07/2018 4:14:45 PM By: Secundino Ginger Entered By: Secundino Ginger on 06/07/2018 13:32:06

## 2018-06-11 NOTE — Progress Notes (Addendum)
**Note [name]-Identified via Obfuscation** [name] ([id number]) Visit Report for 06/07/2018 Chief Complaint Document Details Patient Name: [name], [name] Date of Service: 06/07/2018 1:30 PM Medical Record Number: [id number] Patient Account Number: [id number] Date of Birth/Sex: 27-Jun-1951 (67 y.o. M) Treating RN: [name] Primary Care Provider: [name] Other Clinician: Referring Provider: [name] Treating Provider/Extender: [name], HOYT Weeks in Treatment: 27 Information Obtained from: Patient Chief Complaint Right gluteal fold ulcer Electronic Signature(s) Signed: 06/08/2018 5:24:21 PM By: Worthy Keeler [name] Entered By: Worthy Keeler on 06/07/2018 13:40:19 [name] ([id number]) -------------------------------------------------------------------------------- HPI Details Patient Name: [name] Date of Service: 06/07/2018 1:30 PM Medical Record Number: [id number] Patient Account Number: [id number] Date of Birth/Sex: April 12, 1951 (67 y.o. 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 He states that there is no history of diabetes that he is aware of. He does have issues with his prostate and is currently receiving treatment for this by way of oral medication. With that being said I do not have a lot of details in that regard. Nonetheless the patient presents today as a result of having been referred to [place] by another provider initially home health was set to come out and take care of his wound although due to the fact that he  apparently drives he's not able to receive home health. His wife is therefore trying to help take care of this wound within although they have been struggling with what exactly to do at this point. She states that she can do some things but she is definitely not a nurse and does have some issues with looking at blood. The good news is the wound does not appear to be too deep and is fairly superficial at this point. There is no slough noted there is some nonviable skin noted around the surface of the wound and the perimeter at this point. The central portion of the wound appears to be very good with a dermal layer noted this does not appear to be again deep enough to extend it to subcutaneous tissue at this point. 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 In the pump he has Dilaudid and baclofen. 12/07/17 on evaluation today patient presents for follow-up concerning his ongoing lower back thigh ulcer on the right. He states that he did not get the supplies ordered and therefore has not really been able to perform the dressing changes as directed exactly. His wife was able to get some Boarder Foam Dressing's from the drugstore and subsequently has been using hydrogel which did help to a degree in the wound does appear to be able smaller. There is actually more drainage this week noted than previous. 12/21/17 on evaluation today patient appears to be doing rather well in regard to his right gluteal ulcer. He has been tolerating the dressing changes without complication. There does not appear to be any evidence of infection at this point in time. Overall the wound does seem to be making some progress as far as the edges  are concerned there's not as much in the way of overlapping of the external wound edges and he has a good epithelium to wound bed border for the most part. This however is not true  right at the 12 o'clock location over the span of a little over a centimeters which actually will require debridement today to clean this away and hopefully allow it to continue to heal more appropriately. 12/28/17 on evaluation today patient appears to be doing rather well in regard to his ulcer in the left gluteal region. He's been tolerating the dressing changes without complication. Apparently he has had some difficulty getting his dressing material. Apparently there's been some confusion with ordering we're gonna check into this. Nonetheless overall he's been showing signs of improvement which is good news. Debridement is not required today. 01/04/18 on evaluation today patient presents for follow-up concerning his right gluteal ulcer. He has been tolerating the dressing changes fairly well. On inspection today it appears he may actually have some maceration them concerned about the fact that he may be developing too much moisture in and around the wound bed which can cause delay in healing. With that being said he unfortunately really has not showed significant signs of improvement since last week's evaluation in fact this may even be just the little bit/slightly larger. Nonetheless he's been having a lot of discomfort I'm not sure this is even related to the wound as he has no pain when I'm to breeding or otherwise cleaning the wound during evaluation today. Nonetheless this is something that we did recommend he talked to his pain specialist concerning. 01/11/18 on evaluation today patient appears to be doing better in regard to his ulceration. He has been tolerating the dressing changes without complication. With that being said overall there's no evidence of infection which is good news. The only thing is he did receive the hatch affair blue classic versus the ready nonetheless I feel like this is perfectly fine and appears to have done well for him over the past week. 01/25/18 on evaluation  today patient's wound actually appears to be a little bit larger than during the last evaluation. The good [name], [name]. ([id number]) news is the majority of the wound edges actually appear to be fairly firmly attached to the wound bed unfortunately again we're not really making progress in regard to the size. Roughly the wound is about the same size as when I first saw him although again the wound margin/edges appear to be much better. 02/01/18 on evaluation today patient actually appears to be doing very well in regard to his wound. Applying the Prisma dry does seem to be better although he does still have issues with slow progression of the wound. There was a slight improvement compared to last week's measurements today. Nonetheless I have been considering other options as far as the possibility of Theraskin or even a snap vac. In general I'm not sure that the Theraskin due to location of the wound would be a very good idea. Nonetheless I do think that a snap vac could be a possibility for the patient and in fact I think this could even be an excellent way to manage the wound possibly seeing some improvement in a very rapid fashion here. Nonetheless this is something that we would need to get approved and I did have a lengthy conversation with the patient about this today. 02/08/18 on evaluation today patient appears to be doing a little better in regard to his ulcer.  He has been tolerating the dressing changes without complication. Fortunately despite the fact that the wound is a little bit smaller it's not significantly so unfortunately. We have discussed the possibility of a snap vac we did check with insurance this is actually covered at this point. Fortunately there does not appear to be any sign of infection. Overall I'm fairly pleased with how things seem to be appearing at this point. 02/15/18 on evaluation today patient appears to be doing rather well in regard to his right gluteal  ulcer. Unfortunately the snap vac did not stay in place with his sheer and friction this came loose and did not seem to maintain seal very well. He worked for about two days and it did seem to do very well during that time according to his wife but in general this does not seem to be something that's gonna be beneficial for him long-term. I do believe we need to go back to standard dressings to see if we can find something that will be of benefit. 03/02/18- He is here in follow up evaluation; there is minimal change in the wound. He will continue with the same treatment plan, would consider changing to iodosrob/iodoflex if ulcer continues to to plateau. He will follow up next week 03/08/18 on evaluation today patient's wound actually appears to be about the same size as when I previously saw him several weeks back. Unfortunately he does have some slightly dark discoloration in the central portion of the wound which has me concerned about pressure injury. I do believe he may be sitting for too long a period of time in fact he tells me that "I probably sit for much too long". He does have some Slough noted on the surface of the wound and again as far as the size of the wound is concerned I'm really not seeing anything that seems to have improved significantly. 03/15/18 on evaluation today patient appears to be doing fairly well in regard to his ulcer. The wound measured pretty much about the same today compared to last week's evaluation when looking at his graph. With that being said the area of bruising/deep tissue injury that was noted last week I do not see at this point. He did get a new cushion fortunately this does seem to be have been of benefit in my pinion. It does appear that he's been off of this more which is good news as well I think that is definitely showing in the overall wound measurements. With that being said I do believe that he needs to continue to offload I don't think that the fact  this is doing better should be or is going to allow him to not have to offload and explain this to him as well. Overall he seems to be in agreement the plan I think he understands. The overall appearance of the wound bed is improved compared to last week I think the Iodoflex has been beneficial in that regard. 03/29/18 on evaluation today patient actually appears to be doing rather well in regard to his wound from the overall appearance standpoint he does have some granulation although there's some Slough on the surface of the wound noted as well. With that being said he unfortunately has not improved in regard to the overall measurement of the wound in volume or in size. I did have a discussion with him very specifically about offloading today. He actually does work although he mainly is just sitting throughout the day. He tells me  he offloads by "lifting himself up for 30 seconds off of his chair occasionally" purchase from [employer] which does seem to have helped. And he has a new cushion that he with that being said he's also able to stand some for a very short period of time but not significant enough I think to provide appropriate offloading. I think the biggest issue at this point with the wound and the fact is not healing as quickly as we would like is due to the fact that he is really not able to appropriately offload while at work. He states the beginning after his injury he actually had a bed at his job that he could lay on in order to offload and that does seem to have been of help back at that time. Nonetheless he had not done this in quite some time unfortunately. I think that could be helpful for him this is something I would like for him to look into. 04/05/18 on evaluation today patient actually presents for follow-up concerning his right gluteal ulcer. Again he really is not significantly improved even compared to last week. He has been tolerating the dressing changes without  complication. With that being said fortunately there appears to be no evidence of infection at this time. He has been more proactive in trying to offload. [name], [name] ([id number]) 04/12/18 on evaluation today patient actually appears to be doing a little better in regard to his wound and the right gluteal fold region. He's been tolerating the dressing changes since removing the oasis without complication. However he was having a lot of burning initially with the oasis in place. He's unsure of exactly why this was given so much discomfort but he assumes that it was the oasis itself causing the problem. Nonetheless this had to be removed after about three days in place although even those three days seem to have made a fairly good improvement in regard to the overall appearance of the wound bed. In fact is the first time that he's made any improvement from the standpoint of measurements in about six weeks. He continues to have no discomfort over the area of the wound itself which leads me to wonder why he was having the burning with the oasis when he does not even feel the actual debridement's themselves. I am somewhat perplexed by this. 04/19/18 on evaluation today patient's wound actually appears to be showing signs of epithelialization around the edge of the wound and in general actually appears to be doing better which is good news. He did have the same burning after about three days with applying the Endoform last week in the same fashion that I would generally apply a skin substitute. This seems to indicate that it's not the oasis to cause the problem but potentially the moisture buildup that just causes things to burn or there may be some other reaction with the skin prep or Steri-Strips. Nonetheless I'm not sure that is gonna be able to tolerate any skin substitute for a long period of time. The good news is the wound actually appears to be doing better today compared to last week and  does seem to finally be making some progress. 04/26/18 on evaluation today patient actually appears to be doing rather well in regard to his ulcer in the right gluteal fold. He has been tolerating the dressing changes without complication which is good news. The Endoform does seem to be helping although he was a little bit more macerated this week. This  seems to be an ongoing issue with fluid control at this point. Nonetheless I think we may be able to add something like Drawtex to help control the drainage. 05/03/18 on evaluation today patient appears to actually be doing better in regard to the overall appearance of his wound. He has been tolerating the dressing changes without complication. Fortunately there appears to be no evidence of infection at this time. I really feel like his wound has shown signs as of today of turning around last week I thought so as well and definitely he could be seen in this week's overall appearance and measurements. In general I'm very pleased with the fact that he finally seems to be making a steady but sure progress. The patient likewise is very pleased. 05/17/18 on evaluation today patient appears to be doing more poorly unfortunately in regard to his ulcer. He has been tolerating the dressing changes without complication. With that being said he tells me that in the past couple of days he and his wife have noticed that we did not seem to be doing quite as well is getting dark near the center. Subsequently upon evaluation today the wound actually does appear to be doing worse compared to previous. He has been tolerating the dressing changes otherwise and he states that he is not been sitting up anymore than he was in the past from what he tells me. Still he has continued to work he states "I'm tired of dealing with this and if I have to just go home and lay in the bed all the time that's what I'll do". Nonetheless I am concerned about the fact that this wound does  appear to be deeper than what it was previous. 05/24/18 upon evaluation today patient actually presents after having been in the hospital due to what was presumed to be sepsis secondary to the wound infection. He had an elevated white blood cell count between 14 and 15. With that being said he does seem to be doing somewhat better now. His wound still is giving him some trouble nonetheless and he is obviously concerned about the fact likely talked about that this does seem to go more deeply than previously noted. I did review his wound culture which showed evidence of Staphylococcus aureus him and group B strep. Nonetheless he is on antibiotics, Levaquin, for this. Subsequently I did review his intake summary from the hospital as well. I also did look at the CT of the lumbar spine with contrast that was performed which showed no bone destruction to suggest lumbar disguises/osteomyelitis or sacral osteomyelitis. There was no paraspinal abscess. Nonetheless it appears this may have been more of just a soft tissue infection at this point which is good news. He still is nonetheless concerned about the wound which again I think is completely reasonable considering everything he's been through recently. 05/31/18 on evaluation today on evaluation today patient actually appears to be showing signs of his wound be a little bit deeper than what I would like to see. Fortunately he does not show any signs of significant infection although his temperature was 99 today he states he's been checking this at home and has not been elevated. Nonetheless with the undermining that I'm seeing at this point I am becoming more concerned about the wound I do think that offloading is a key factor here that is preventing the speedy recovery at this point. There does not appear to be any evidence of again over infection noted. He's been using  Santyl currently. 06/07/18 the patient presents today for follow-up evaluation  regarding the ulcer in the right gluteal region. He has been tolerating the Wound VAC fairly well. He is obviously very frustrated with this he states that to mean is really getting in his way. There does not appear to be any evidence of infection at this time he does have a little bit of odor I do not necessarily associate this with infection just something that we sometimes notice with Wound VAC therapy. With that being said I can definitely catch a tone of discontentment overall in the patient's demeanor today. This when he was previously in the hospital [name], [name]. ([id number]) an CT scan was done of the lumbar region which did not reveal any signs of osteomyelitis. With that being said the pelvis in particular was not evaluated distinctly which means he could still have some osteonecrosis I. Nonetheless the Wound VAC was started on Thursday I do want to get this little bit more time before jumping to a CT scan of the pelvis although that is something that I might would recommend if were not see an improvement by that time. Electronic Signature(s) Signed: 06/27/2018 11:36:14 PM By: Worthy Keeler [name] Previous Signature: 06/08/2018 5:24:21 PM Version By: Worthy Keeler [name] Entered By: Worthy Keeler on 06/27/2018 23:27:23 [name], [name] ([id number]) -------------------------------------------------------------------------------- Physical Exam Details Patient Name: [name] Date of Service: 06/07/2018 1:30 PM Medical Record Number: [id number] Patient Account Number: [id number] Date of Birth/Sex: 04-30-1951 (67 y.o. M) Treating RN: [name] Primary Care Provider: [name] Other Clinician: Referring Provider: [name] Treating Provider/Extender: [name], HOYT Weeks in Treatment: 35 Constitutional Well-nourished and well-hydrated in no acute distress. Respiratory normal breathing without difficulty. clear to auscultation  bilaterally. Cardiovascular regular rate and rhythm with normal S1, S2. Psychiatric this patient is able to make decisions and demonstrates good insight into disease process. Alert and Oriented x 3. pleasant and cooperative. Notes Patient's wound bed currently shows evidence of granulation on the surface of the wound he still does have depth to the wound I cannot directly palpate any bone at this time. He still continues to transfer himself he does not have a lift therefore he is definitely sliding for transfer. No sharp debridement was performed in regard to the wound today. Electronic Signature(s) Signed: 06/08/2018 5:24:21 PM By: Worthy Keeler [name] Entered By: Worthy Keeler on 06/08/2018 11:00:25 [name], [name] ([id number]) -------------------------------------------------------------------------------- Physician Orders Details Patient Name: [name] Date of Service: 06/07/2018 1:30 PM Medical Record Number: [id number] Patient Account Number: [id number] Date of Birth/Sex: 1950-07-30 (67 y.o. 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 Primary Wound Dressing Wound #1 Right Gluteal fold o Silver Collagen Secondary Dressing Wound #1 Right Gluteal fold o Boardered Foam Dressing Dressing Change  Frequency Wound #1 Right Gluteal fold o Change Dressing Monday, Wednesday, Friday Follow-up Appointments Wound #1 Right Gluteal fold o Return Appointment in  1 week. Off-Loading Wound #1 Right Gluteal fold o Mattress - HHRN order mattress for patient o Turn and reposition every 2 hours [place] #1 Right Gluteal fold o Creek to Initiate Wound Vac and appropriate mattress for patient o Home Health Nurse may visit PRN to address patientos wound care needs. - Please bridge to hip [name], [name] ([id number]) o FACE TO FACE ENCOUNTER: MEDICARE and MEDICAID PATIENTS: I certify that this patient is under my care and that I had a face-to-face encounter that meets the physician face-to-face encounter requirements with this patient on this date. The encounter with the patient was in whole or in part for the following MEDICAL CONDITION: (primary reason for [place]) MEDICAL NECESSITY: I certify, that based on my findings, NURSING services are a medically necessary home health service. HOME BOUND STATUS: I certify that my clinical findings support that this patient is homebound (i.e., Due to illness or injury, pt requires aid of supportive devices such as crutches, cane, wheelchairs, walkers, the use of special transportation or the assistance of another person to leave their place of residence. There is a normal inability to leave the home and doing so requires considerable and taxing effort. Other absences are for medical reasons / religious services and are infrequent or of short duration when for other reasons). o If current dressing causes regression in wound condition, may D/C ordered dressing product/s and apply Normal Saline Moist Dressing daily until next [place] / Other MD appointment. [place] of regression in wound condition at [phone]. o Please direct any NON-WOUND related  issues/requests for orders to patient's Primary Care Physician Negative Pressure Wound Therapy Wound #1 Right Gluteal fold o Wound VAC settings at 125/130 mmHg continuous pressure. Use BLACK/GREEN foam to wound cavity. Use WHITE foam to fill any tunnel/s and/or undermining. Change VAC dressing 3 X WEEK. Change canister as indicated when full. Nurse may titrate settings and frequency of dressing changes as clinically indicated. - Silver collagen to base of wound o Home Health Nurse may d/c VAC for s/s of increased infection, significant wound regression, or uncontrolled drainage. [place] at [phone]. o Number of foam/gauze pieces used in the dressing = Medications-please add to medication list. Wound #1 Right Gluteal fold o P.O. Antibiotics - Complete Levaquin Electronic Signature(s) Signed: 06/08/2018 10:18:29 AM By: Gretta Cool, BSN, RN, CWS, Kim RN, BSN Signed: 06/08/2018 5:24:21 PM By: Worthy Keeler [name] Entered By: Gretta Cool BSN, RN, CWS, Kim on 06/07/2018 14:06:38 [name], [name] ([id number]) -------------------------------------------------------------------------------- Problem List Details Patient Name: [name] Date of Service: 06/07/2018 1:30 PM Medical Record Number: [id number] Patient Account Number: [id number] Date of Birth/Sex: [birth year] (67 y.o. 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 He states that there is no history of diabetes that he is aware of. He does have issues with his prostate and is currently receiving treatment for this by way of oral medication. With that being said I do not have a lot of details in that regard. Nonetheless the patient presents today as a result of having been referred to [place] by another provider initially home health was set to come out and take care of his wound although due to the fact that he apparently drives he's not able to receive home health. His wife is therefore trying to help take care of this wound within although they have been struggling with what exactly to do at this point. She states that she can do some things but she is definitely not a nurse and does have some issues with looking at blood. The good news is the wound does not appear to be too deep and is fairly superficial at this point. There is no slough noted there  is some nonviable skin noted around the surface of the wound and the perimeter at this point. The central portion of the wound appears to be very good with a dermal layer noted this does not appear to be again deep enough to extend it to subcutaneous tissue at this point. 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 In the pump he has Dilaudid and baclofen. 12/07/17 on evaluation today patient presents for follow-up concerning his ongoing lower back thigh ulcer on the right. He states that he did not get the supplies ordered and therefore has not really been able to perform the dressing changes as directed exactly. His wife was able to get some Boarder Foam Dressing's from the drugstore and subsequently has been using hydrogel which did help to a degree in the wound does appear to be able smaller. There is actually more drainage this week noted than previous. 12/21/17 on evaluation today patient appears to be doing rather well in regard to his right gluteal ulcer. He has been tolerating the dressing changes without complication. There does not appear to be any evidence of infection at this point in time. Overall the wound does seem to be making some progress as far as the edges are concerned there's not as much in the way of overlapping of the external wound edges and he has a good epithelium to wound bed border for the most part. This however is not true right at the 12 o'clock location over the span of a little over a centimeters which actually will require debridement today to clean this away and hopefully allow it to continue to heal more appropriately. 12/28/17 on evaluation today patient appears to be doing rather well in regard to his ulcer in the left gluteal region. He's been tolerating the dressing changes without complication. Apparently he has had some difficulty getting his dressing  material. Apparently there's been  some confusion with ordering we're gonna check into this. Nonetheless overall he's been showing signs of improvement which is good news. Debridement is not required today. 01/04/18 on evaluation today patient presents for follow-up concerning his right gluteal ulcer. He has been tolerating the dressing changes fairly well. On inspection today it appears he may actually have some maceration them concerned about the fact that he may be developing too much moisture in and around the wound bed which can cause delay in healing. With that being said he unfortunately really has not showed significant signs of improvement since last week's evaluation in fact this may even be just the little bit/slightly larger. Nonetheless he's been having a lot of discomfort I'm not sure this is even related to the wound as he has no pain when I'm to breeding or otherwise cleaning the wound during evaluation today. Nonetheless this is something that we did recommend he talked to his pain specialist concerning. 01/11/18 on evaluation today patient appears to be doing better in regard to his ulceration. He has been tolerating the dressing Titus, Takeshi J. ([id number]) changes without complication. With that being said overall there's no evidence of infection which is good news. The only thing is he did receive the hatch affair blue classic versus the ready nonetheless I feel like this is perfectly fine and appears to have done well for him over the past week. 01/25/18 on evaluation today patient's wound actually appears to be a little bit larger than during the last evaluation. The good news is the majority of the wound edges actually appear to be fairly firmly attached to the wound bed unfortunately again we're not really making progress in regard to the size. Roughly the wound is about the same size as when I first saw him although again the wound margin/edges appear to be much  better. 02/01/18 on evaluation today patient actually appears to be doing very well in regard to his wound. Applying the Prisma dry does seem to be better although he does still have issues with slow progression of the wound. There was a slight improvement compared to last week's measurements today. Nonetheless I have been considering other options as far as the possibility of Theraskin or even a snap vac. In general I'm not sure that the Theraskin due to location of the wound would be a very good idea. Nonetheless I do think that a snap vac could be a possibility for the patient and in fact I think this could even be an excellent way to manage the wound possibly seeing some improvement in a very rapid fashion here. Nonetheless this is something that we would need to get approved and I did have a lengthy conversation with the patient about this today. 02/08/18 on evaluation today patient appears to be doing a little better in regard to his ulcer. He has been tolerating the dressing changes without complication. Fortunately despite the fact that the wound is a little bit smaller it's not significantly so unfortunately. We have discussed the possibility of a snap vac we did check with insurance this is actually covered at this point. Fortunately there does not appear to be any sign of infection. Overall I'm fairly pleased with how things seem to be appearing at this point. 02/15/18 on evaluation today patient appears to be doing rather well in regard to his right gluteal ulcer. Unfortunately the snap vac did not stay in place with his sheer and friction this came loose and did not seem to maintain  seal very well. He worked for about two days and it did seem to do very well during that time according to his wife but in general this does not seem to be something that's gonna be beneficial for him long-term. I do believe we need to go back to standard dressings to see if we can find something that will be  of benefit. 03/02/18- He is here in follow up evaluation; there is minimal change in the wound. He will continue with the same treatment plan, would consider changing to iodosrob/iodoflex if ulcer continues to to plateau. He will follow up next week 03/08/18 on evaluation today patient's wound actually appears to be about the same size as when I previously saw him several weeks back. Unfortunately he does have some slightly dark discoloration in the central portion of the wound which has me concerned about pressure injury. I do believe he may be sitting for too long a period of time in fact he tells me that "I probably sit for much too long". He does have some Slough noted on the surface of the wound and again as far as the size of the wound is concerned I'm really not seeing anything that seems to have improved significantly. 03/15/18 on evaluation today patient appears to be doing fairly well in regard to his ulcer. The wound measured pretty much about the same today compared to last week's evaluation when looking at his graph. With that being said the area of bruising/deep tissue injury that was noted last week I do not see at this point. He did get a new cushion fortunately this does seem to be have been of benefit in my pinion. It does appear that he's been off of this more which is good news as well I think that is definitely showing in the overall wound measurements. With that being said I do believe that he needs to continue to offload I don't think that the fact this is doing better should be or is going to allow him to not have to offload and explain this to him as well. Overall he seems to be in agreement the plan I think he understands. The overall appearance of the wound bed is improved compared to last week I think the Iodoflex has been beneficial in that regard. 03/29/18 on evaluation today patient actually appears to be doing rather well in regard to his wound from the overall  appearance standpoint he does have some granulation although there's some Slough on the surface of the wound noted as well. With that being said he unfortunately has not improved in regard to the overall measurement of the wound in volume or in size. I did have a discussion with him very specifically about offloading today. He actually does work although he mainly is just sitting throughout the day. He tells me he offloads by "lifting himself up for 30 seconds off of his chair occasionally" purchase from [employer] which does seem to have helped. And he has a new cushion that he with that being said he's also able to stand some for a very short period of time but not significant enough I think to provide appropriate offloading. I think the biggest issue at this point with the wound and the fact is not healing as quickly as we would like is due to the fact that he is really not able to appropriately offload while at work. He states the beginning after his injury he actually had a bed at his  job that he could lay on in order to offload and that does seem to have been of help back at that time. Nonetheless he had not done this in quite some time unfortunately. I think that could be helpful for him this is something I would like for him to look into. [name], [name] ([id number]) 04/05/18 on evaluation today patient actually presents for follow-up concerning his right gluteal ulcer. Again he really is not significantly improved even compared to last week. He has been tolerating the dressing changes without complication. With that being said fortunately there appears to be no evidence of infection at this time. He has been more proactive in trying to offload. 04/12/18 on evaluation today patient actually appears to be doing a little better in regard to his wound and the right gluteal fold region. He's been tolerating the dressing changes since removing the oasis without complication. However he  was having a lot of burning initially with the oasis in place. He's unsure of exactly why this was given so much discomfort but he assumes that it was the oasis itself causing the problem. Nonetheless this had to be removed after about three days in place although even those three days seem to have made a fairly good improvement in regard to the overall appearance of the wound bed. In fact is the first time that he's made any improvement from the standpoint of measurements in about six weeks. He continues to have no discomfort over the area of the wound itself which leads me to wonder why he was having the burning with the oasis when he does not even feel the actual debridement's themselves. I am somewhat perplexed by this. 04/19/18 on evaluation today patient's wound actually appears to be showing signs of epithelialization around the edge of the wound and in general actually appears to be doing better which is good news. He did have the same burning after about three days with applying the Endoform last week in the same fashion that I would generally apply a skin substitute. This seems to indicate that it's not the oasis to cause the problem but potentially the moisture buildup that just causes things to burn or there may be some other reaction with the skin prep or Steri-Strips. Nonetheless I'm not sure that is gonna be able to tolerate any skin substitute for a long period of time. The good news is the wound actually appears to be doing better today compared to last week and does seem to finally be making some progress. 04/26/18 on evaluation today patient actually appears to be doing rather well in regard to his ulcer in the right gluteal fold. He has been tolerating the dressing changes without complication which is good news. The Endoform does seem to be helping although he was a little bit more macerated this week. This seems to be an ongoing issue with fluid control at this point. Nonetheless  I think we may be able to add something like Drawtex to help control the drainage. 05/03/18 on evaluation today patient appears to actually be doing better in regard to the overall appearance of his wound. He has been tolerating the dressing changes without complication. Fortunately there appears to be no evidence of infection at this time. I really feel like his wound has shown signs as of today of turning around last week I thought so as well and definitely he could be seen in this week's overall appearance and measurements. In general I'm very pleased with the fact  that he finally seems to be making a steady but sure progress. The patient likewise is very pleased. 05/17/18 on evaluation today patient appears to be doing more poorly unfortunately in regard to his ulcer. He has been tolerating the dressing changes without complication. With that being said he tells me that in the past couple of days he and his wife have noticed that we did not seem to be doing quite as well is getting dark near the center. Subsequently upon evaluation today the wound actually does appear to be doing worse compared to previous. He has been tolerating the dressing changes otherwise and he states that he is not been sitting up anymore than he was in the past from what he tells me. Still he has continued to work he states "I'm tired of dealing with this and if I have to just go home and lay in the bed all the time that's what I'll do". Nonetheless I am concerned about the fact that this wound does appear to be deeper than what it was previous. 05/24/18 upon evaluation today patient actually presents after having been in the hospital due to what was presumed to be sepsis secondary to the wound infection. He had an elevated white blood cell count between 14 and 15. With that being said he does seem to be doing somewhat better now. His wound still is giving him some trouble nonetheless and he is obviously concerned about  the fact likely talked about that this does seem to go more deeply than previously noted. I did review his wound culture which showed evidence of Staphylococcus aureus him and group B strep. Nonetheless he is on antibiotics, Levaquin, for this. Subsequently I did review his intake summary from the hospital as well. I also did look at the CT of the lumbar spine with contrast that was performed which showed no bone destruction to suggest lumbar disguises/osteomyelitis or sacral osteomyelitis. There was no paraspinal abscess. Nonetheless it appears this may have been more of just a soft tissue infection at this point which is good news. He still is nonetheless concerned about the wound which again I think is completely reasonable considering everything he's been through recently. 05/31/18 on evaluation today on evaluation today patient actually appears to be showing signs of his wound be a little bit deeper than what I would like to see. Fortunately he does not show any signs of significant infection although his temperature was 99 today he states he's been checking this at home and has not been elevated. Nonetheless with the undermining that I'm seeing at this point I am becoming more concerned about the wound I do think that offloading is a key factor here that is preventing the speedy recovery at this point. There does not appear to be any evidence of again over infection noted. He's been using Santyl currently. [name], [name] ([id number]) 06/07/18 the patient presents today for follow-up evaluation regarding the ulcer in the right gluteal region. He has been tolerating the Wound VAC fairly well. He is obviously very frustrated with this he states that to mean is really getting in his way. There does not appear to be any evidence of infection at this time he does have a little bit of odor I do not necessarily associate this with infection just something that we sometimes notice with Wound VAC  therapy. With that being said I can definitely catch a tone of discontentment overall in the patient's demeanor today. This when he was previously in  the hospital an CT scan was done of the lumbar region which did not reveal any signs of osteomyelitis. With that being said the pelvis in particular was not evaluated distinctly which means he could still have some osteonecrosis I. Nonetheless the Wound VAC was started on Thursday I do want to get this little bit more time before jumping to a CT scan of the pelvis although that is something that I might would recommend if were not see an improvement by that time. Patient History Information obtained from Patient. Family History Hypertension - Father, Stroke - Mother, No family history of Cancer, Diabetes, Heart Disease, Kidney Disease, Lung Disease, Seizures, Thyroid Problems, Tuberculosis. Social History Never smoker, Marital Status - Married, Alcohol Use - Never, Drug Use - No History, Caffeine Use - Daily. Medical And Surgical History Notes Oncologic Prostate cancer- currently treated with horomone therapy Review of Systems (ROS) Constitutional Symptoms (General Health) Denies complaints or symptoms of Fever, Chills. Respiratory The patient has no complaints or symptoms. Cardiovascular The patient has no complaints or symptoms. Psychiatric The patient has no complaints or symptoms. Objective Constitutional Well-nourished and well-hydrated in no acute distress. Vitals Time Taken: 1:28 PM, Height: 73 in, Weight: 210 lbs, BMI: 27.7, Temperature: 99.0 F, Pulse: 100 bpm, Respiratory Rate: 16 breaths/min, Blood Pressure: 133/84 mmHg. Respiratory normal breathing without difficulty. clear to auscultation bilaterally. Cardiovascular regular rate and rhythm with normal S1, S2. Hegel, Jasiyah J. ([id number]) Psychiatric this patient is able to make decisions and demonstrates good insight into disease process. Alert and Oriented x 3.  pleasant and cooperative. General Notes: Patient's wound bed currently shows evidence of granulation on the surface of the wound he still does have depth to the wound I cannot directly palpate any bone at this time. He still continues to transfer himself he does not have a lift therefore he is definitely sliding for transfer. No sharp debridement was performed in regard to the wound today. Integumentary (Hair, Skin) Wound #1 status is Open. Original cause of wound was Pressure Injury. The wound is located on the Right Gluteal fold. The wound measures 4.2cm length x 3cm width x 1.8cm depth; 9.896cm^2 area and 17.813cm^3 volume. There is Fat Layer (Subcutaneous Tissue) Exposed exposed. There is no tunneling or undermining noted. There is a medium amount of serous drainage noted. The wound margin is flat and intact. There is small (1-33%) pink granulation within the wound bed. There is a large (67-100%) amount of necrotic tissue within the wound bed including Eschar and Adherent Slough. The periwound skin appearance exhibited: Maceration, Erythema. The periwound skin appearance did not exhibit: Callus, Crepitus, Excoriation, Induration, Rash, Scarring, Dry/Scaly, Atrophie Blanche, Cyanosis, Ecchymosis, Hemosiderin Staining, Mottled, Pallor, Rubor. The surrounding wound skin color is noted with erythema which is circumferential. Periwound temperature was noted as No Abnormality. 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 Primary Wound Dressing: Wound #1 Right Gluteal fold: Silver  Collagen Secondary Dressing: Wound #1 Right Gluteal fold: Boardered Foam Dressing Dressing Change Frequency: Wound #1 Right Gluteal fold: Change Dressing Monday, Wednesday, Friday Follow-up Appointments: Wound #1 Right Gluteal fold: Return Appointment in 1 week. DENIS, CARREON ([id number]) Off-Loading: Wound #1 Right  Gluteal fold: Mattress - HHRN order mattress for patient Turn and reposition every 2 hours Home Health: Wound #1 Right Gluteal fold: Continue Home Health Visits - [place] to Initiate Wound Vac and appropriate mattress for patient Home Health Nurse may visit PRN to address patient s wound care needs. - Please bridge to hip FACE TO FACE ENCOUNTER: MEDICARE and MEDICAID PATIENTS: I certify that this patient is under my care and that I had a face-to-face encounter that meets the physician face-to-face encounter requirements with this patient on this date. The encounter with the patient was in whole or in part for the following MEDICAL CONDITION: (primary reason for [place]) MEDICAL NECESSITY: I certify, that based on my findings, NURSING services are a medically necessary home health service. HOME BOUND STATUS: I certify that my clinical findings support that this patient is homebound (i.e., Due to illness or injury, pt requires aid of supportive devices such as crutches, cane, wheelchairs, walkers, the use of special transportation or the assistance of another person to leave their place of residence. There is a normal inability to leave the home and doing so requires considerable and taxing effort. Other absences are for medical reasons / religious services and are infrequent or of short duration when for other reasons). If current dressing causes regression in wound condition, may D/C ordered dressing product/s and apply Normal Saline Moist Dressing daily until next Potter / Other MD appointment. [name] [place] of regression  in wound condition at [phone]. Please direct any NON-WOUND related issues/requests for orders to patient's Primary Care Physician Negative Pressure Wound Therapy: Wound #1 Right Gluteal fold: Wound VAC settings at 125/130 mmHg continuous pressure. Use BLACK/GREEN foam to wound cavity. Use WHITE foam to fill any tunnel/s and/or undermining. Change VAC dressing 3 X WEEK. Change canister as indicated when full. Nurse may titrate settings and frequency of dressing changes as clinically indicated. - Silver collagen to base of wound Home Health Nurse may d/c VAC for s/s of increased infection, significant wound regression, or uncontrolled drainage. [place] at [phone]. Number of foam/gauze pieces used in the dressing = Medications-please add to medication list.: Wound #1 Right Gluteal fold: P.O. Antibiotics - Complete Levaquin At this point again I did want to give a little bit more time for the Wound VAC therapy in order to see if he shows any signs of improvement before we make a determination as to whether or not to proceed with the CT scan of the pelvis. Nonetheless I may need to proceed on that route if things do not seem to show signs of improvement he's almost done with the antibiotics that I prescribed for him. We will subsequently see him back for reevaluation to see were things stand. Please see above for specific wound care orders. We will see patient for re-evaluation in 1 week(s) here in the clinic. If anything worsens or changes patient will contact our office for additional recommendations. Electronic Signature(s) Signed: 06/27/2018 11:36:14 PM By: Worthy Keeler [name] Previous Signature: 06/08/2018 5:24:21 PM Version By: Worthy Keeler [name] Entered By: Worthy Keeler on 06/27/2018 23:27:45 JAIMON, BUGAJ ([id number]) -------------------------------------------------------------------------------- ROS/PFSH Details Patient Name: [name] Date of Service: 06/07/2018 1:30 PM Medical Record Number: [id number] Patient Account Number: [id number] Date of Birth/Sex: 19-Apr-1951 (67 y.o. M) Treating RN: [name] Primary Care Provider: [name] Other Clinician: Referring Provider: [name] Treating Provider/Extender: [name], HOYT Weeks in Treatment: 27 Information Obtained From  Patient Wound History Do you currently have one or more open woundso Yes How many open wounds do you currently haveo 1 Approximately how long have you had your woundso 1 month How have you been treating your wound(s) until nowo cream Has your wound(s) ever healed and then re-openedo No Have you had any lab work done in the past montho No Have you tested positive for an antibiotic resistant organism (MRSA, VRE)o No Have you tested positive for osteomyelitis (bone infection)o No Have you had any tests for circulation on your legso No Constitutional Symptoms (General Health) Complaints and Symptoms: Negative for: Fever; Chills Eyes Medical History: Positive for: Cataracts - both removed Negative for: Glaucoma; Optic Neuritis Ear/Nose/Mouth/Throat Medical History: Negative for: Chronic sinus problems/congestion; Middle ear problems Hematologic/Lymphatic Medical History: Negative for: Anemia; Hemophilia; Human Immunodeficiency Virus; Lymphedema Respiratory Complaints and Symptoms: No Complaints or Symptoms Medical History: Negative for: Aspiration; Asthma; Chronic Obstructive Pulmonary Disease (COPD); Pneumothorax; Sleep Apnea; Tuberculosis Cardiovascular Complaints and Symptoms: No Complaints or Symptoms Medical History: Positive for: Hypertension - takes medication AYAZ, SONDGEROTH. ([id number]) Negative for: Angina; Arrhythmia; Congestive Heart Failure; Coronary Artery Disease; Deep Vein Thrombosis; Hypotension; Myocardial Infarction; Peripheral Arterial Disease; Peripheral Venous Disease; Phlebitis;  Vasculitis Gastrointestinal Medical History: Negative for: Cirrhosis ; Colitis; Crohnos; Hepatitis A; Hepatitis B; Hepatitis C Endocrine Medical History: Negative for: Type I Diabetes; Type II Diabetes Genitourinary Medical History: Negative for: End Stage Renal Disease Immunological Medical History: Negative for: Lupus Erythematosus; Raynaudos; Scleroderma Integumentary (Skin) Medical History: Negative for: History of Burn; History of pressure wounds Musculoskeletal Medical History: Negative for: Gout; Rheumatoid Arthritis; Osteoarthritis; Osteomyelitis Neurologic Medical History: Positive for: Paraplegia - waist down Negative for: Dementia; Neuropathy; Quadriplegia; Seizure Disorder Oncologic Medical History: Negative for: Received Chemotherapy; Received Radiation Past Medical History Notes: Prostate cancer- currently treated with horomone therapy Psychiatric Complaints and Symptoms: No Complaints or Symptoms Medical History: Negative for: Anorexia/bulimia; Confinement Anxiety HBO Extended History Items Eyes: Cataracts Immunizations LAMINE, LATON ([id number]) Pneumococcal Vaccine: Received Pneumococcal Vaccination: No Implantable Devices Family and Social History Cancer: No; Diabetes: No; Heart Disease: No; Hypertension: Yes - Father; Kidney Disease: No; Lung Disease: No; Seizures: No; Stroke: Yes - Mother; Thyroid Problems: No; Tuberculosis: No; Never smoker; Marital Status - Married; Alcohol Use: Never; Drug Use: No History; Caffeine Use: Daily; Advanced Directives: Yes (Copy provided); Patient does not want information on Advanced Directives; Living Will: Yes (Copy provided) Physician Affirmation I have reviewed and agree with the above information. Electronic Signature(s) Signed: 06/08/2018 5:24:21 PM By: Worthy Keeler [name] Signed: 06/09/2018 5:21:06 PM By: Gretta Cool BSN, RN, CWS, Kim RN, BSN Entered By: Worthy Keeler on 06/08/2018 11:00:14 DAN, SCEARCE ([id number]) -------------------------------------------------------------------------------- SuperBill Details Patient Name: [name] Date of Service: 06/07/2018 Medical Record Number: [id number] Patient Account Number: [id number] Date of Birth/Sex: 09-19-1950 (67 y.o. 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, sequela I10 Essential (primary) hypertension Facility Procedures CPT4 Code: 09628366 Description: [phone] - WOUND CARE VISIT-LEV 2 EST PT Modifier: Quantity: 1 Physician Procedures CPT4 Code: 5465035 Description: 46568 - WC PHYS LEVEL 4 - EST PT ICD-10 Diagnosis Description 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

## 2018-06-14 ENCOUNTER — Encounter: Payer: Medicare Other | Admitting: Physician Assistant

## 2018-06-14 DIAGNOSIS — L89314 Pressure ulcer of right buttock, stage 4: Secondary | ICD-10-CM | POA: Diagnosis not present

## 2018-06-15 ENCOUNTER — Other Ambulatory Visit: Payer: Self-pay | Admitting: Physician Assistant

## 2018-06-15 DIAGNOSIS — S34109S Unspecified injury to unspecified level of lumbar spinal cord, sequela: Secondary | ICD-10-CM

## 2018-06-15 DIAGNOSIS — L89314 Pressure ulcer of right buttock, stage 4: Secondary | ICD-10-CM

## 2018-06-15 DIAGNOSIS — L89313 Pressure ulcer of right buttock, stage 3: Secondary | ICD-10-CM

## 2018-06-15 DIAGNOSIS — S32009S Unspecified fracture of unspecified lumbar vertebra, sequela: Secondary | ICD-10-CM

## 2018-06-17 NOTE — Progress Notes (Signed)
[name] ([id number]) Visit Report for 06/14/2018 Chief Complaint Document Details Patient Name: [name], [name] Date of Service: 06/14/2018 10:00 AM Medical Record Number: [id number] Patient Account Number: [id number] Date of Birth/Sex: 15-Jun-1951 (67 y.o. M) Treating RN: Harold Barban Primary Care Provider: [name] Other Clinician: Referring Provider: [name] Treating Provider/Extender: [name], HOYT Weeks in Treatment: 28 Information Obtained from: Patient Chief Complaint Right gluteal fold ulcer Electronic Signature(s) Signed: 06/14/2018 5:22:53 PM By: Worthy Keeler [name] Entered By: Worthy Keeler on 06/14/2018 09:50:03 [name] ([id number]) -------------------------------------------------------------------------------- HPI Details Patient Name: [name] Date of Service: 06/14/2018 10:00 AM Medical Record Number: [id number] Patient Account Number: [id number] Date of Birth/Sex: 14-Apr-1951 (67 y.o. 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 He states that there is no history of diabetes that he is aware of. He does have issues with his prostate and is currently receiving treatment for this by way of oral medication. With that being said I do not have a lot of details in that regard. Nonetheless the patient presents today as a result of having been referred to [place] by another provider initially home health was set to come out and take care of his wound although due to the  fact that he apparently drives he's not able to receive home health. His wife is therefore trying to help take care of this wound within although they have been struggling with what exactly to do at this point. She states that she can do some things but she is definitely not a nurse and does have some issues with looking at blood. The good news is the wound does not appear to be too deep and is fairly superficial at this point. There is no slough noted there is some nonviable skin noted around the surface of the wound and the perimeter at this point. The central portion of the wound appears to be very good with a dermal layer noted this does not appear to be again deep enough to extend it to subcutaneous tissue at this point. 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 In the pump he has Dilaudid and baclofen. 12/07/17 on evaluation today patient presents for follow-up concerning his ongoing lower back thigh ulcer on the right. He states that he did not get the supplies ordered and therefore has not really been able to perform the dressing changes as directed exactly. His wife was able to get some Boarder Foam Dressing's from the drugstore and subsequently has been using hydrogel which did help to a degree in the wound does appear to be able smaller. There is actually more drainage this week noted than previous. 12/21/17 on evaluation today patient appears to be doing rather well in regard to his right gluteal ulcer. He has been tolerating the dressing changes without complication. There does not appear to be any evidence of infection at this point in time. Overall the wound does seem to be making some progress as far as the edges  are concerned there's not as much in the way of overlapping of the external wound edges and he has a good epithelium to wound bed border for the most part. This  however is not true right at the 12 o'clock location over the span of a little over a centimeters which actually will require debridement today to clean this away and hopefully allow it to continue to heal more appropriately. 12/28/17 on evaluation today patient appears to be doing rather well in regard to his ulcer in the left gluteal region. He's been tolerating the dressing changes without complication. Apparently he has had some difficulty getting his dressing material. Apparently there's been some confusion with ordering we're gonna check into this. Nonetheless overall he's been showing signs of improvement which is good news. Debridement is not required today. 01/04/18 on evaluation today patient presents for follow-up concerning his right gluteal ulcer. He has been tolerating the dressing changes fairly well. On inspection today it appears he may actually have some maceration them concerned about the fact that he may be developing too much moisture in and around the wound bed which can cause delay in healing. With that being said he unfortunately really has not showed significant signs of improvement since last week's evaluation in fact this may even be just the little bit/slightly larger. Nonetheless he's been having a lot of discomfort I'm not sure this is even related to the wound as he has no pain when I'm to breeding or otherwise cleaning the wound during evaluation today. Nonetheless this is something that we did recommend he talked to his pain specialist concerning. 01/11/18 on evaluation today patient appears to be doing better in regard to his ulceration. He has been tolerating the dressing changes without complication. With that being said overall there's no evidence of infection which is good news. The only thing is he did receive the hatch affair blue classic versus the ready nonetheless I feel like this is perfectly fine and appears to have done well for him over the past  week. 01/25/18 on evaluation today patient's wound actually appears to be a little bit larger than during the last evaluation. The good [name]. ([id number]) news is the majority of the wound edges actually appear to be fairly firmly attached to the wound bed unfortunately again we're not really making progress in regard to the size. Roughly the wound is about the same size as when I first saw him although again the wound margin/edges appear to be much better. 02/01/18 on evaluation today patient actually appears to be doing very well in regard to his wound. Applying the Prisma dry does seem to be better although he does still have issues with slow progression of the wound. There was a slight improvement compared to last week's measurements today. Nonetheless I have been considering other options as far as the possibility of Theraskin or even a snap vac. In general I'm not sure that the Theraskin due to location of the wound would be a very good idea. Nonetheless I do think that a snap vac could be a possibility for the patient and in fact I think this could even be an excellent way to manage the wound possibly seeing some improvement in a very rapid fashion here. Nonetheless this is something that we would need to get approved and I did have a lengthy conversation with the patient about this today. 02/08/18 on evaluation today patient appears to be doing a little better in regard to his ulcer.  He has been tolerating the dressing changes without complication. Fortunately despite the fact that the wound is a little bit smaller it's not significantly so unfortunately. We have discussed the possibility of a snap vac we did check with insurance this is actually covered at this point. Fortunately there does not appear to be any sign of infection. Overall I'm fairly pleased with how things seem to be appearing at this point. 02/15/18 on evaluation today patient appears to be doing rather well in  regard to his right gluteal ulcer. Unfortunately the snap vac did not stay in place with his sheer and friction this came loose and did not seem to maintain seal very well. He worked for about two days and it did seem to do very well during that time according to his wife but in general this does not seem to be something that's gonna be beneficial for him long-term. I do believe we need to go back to standard dressings to see if we can find something that will be of benefit. 03/02/18- He is here in follow up evaluation; there is minimal change in the wound. He will continue with the same treatment plan, would consider changing to iodosrob/iodoflex if ulcer continues to to plateau. He will follow up next week 03/08/18 on evaluation today patient's wound actually appears to be about the same size as when I previously saw him several weeks back. Unfortunately he does have some slightly dark discoloration in the central portion of the wound which has me concerned about pressure injury. I do believe he may be sitting for too long a period of time in fact he tells me that "I probably sit for much too long". He does have some Slough noted on the surface of the wound and again as far as the size of the wound is concerned I'm really not seeing anything that seems to have improved significantly. 03/15/18 on evaluation today patient appears to be doing fairly well in regard to his ulcer. The wound measured pretty much about the same today compared to last week's evaluation when looking at his graph. With that being said the area of bruising/deep tissue injury that was noted last week I do not see at this point. He did get a new cushion fortunately this does seem to be have been of benefit in my pinion. It does appear that he's been off of this more which is good news as well I think that is definitely showing in the overall wound measurements. With that being said I do believe that he needs to continue to offload  I don't think that the fact this is doing better should be or is going to allow him to not have to offload and explain this to him as well. Overall he seems to be in agreement the plan I think he understands. The overall appearance of the wound bed is improved compared to last week I think the Iodoflex has been beneficial in that regard. 03/29/18 on evaluation today patient actually appears to be doing rather well in regard to his wound from the overall appearance standpoint he does have some granulation although there's some Slough on the surface of the wound noted as well. With that being said he unfortunately has not improved in regard to the overall measurement of the wound in volume or in size. I did have a discussion with him very specifically about offloading today. He actually does work although he mainly is just sitting throughout the day. He tells me  he offloads by "lifting himself up for 30 seconds off of his chair occasionally" purchase from [employer] which does seem to have helped. And he has a new cushion that he with that being said he's also able to stand some for a very short period of time but not significant enough I think to provide appropriate offloading. I think the biggest issue at this point with the wound and the fact is not healing as quickly as we would like is due to the fact that he is really not able to appropriately offload while at work. He states the beginning after his injury he actually had a bed at his job that he could lay on in order to offload and that does seem to have been of help back at that time. Nonetheless he had not done this in quite some time unfortunately. I think that could be helpful for him this is something I would like for him to look into. 04/05/18 on evaluation today patient actually presents for follow-up concerning his right gluteal ulcer. Again he really is not significantly improved even compared to last week. He has been tolerating  the dressing changes without complication. With that being said fortunately there appears to be no evidence of infection at this time. He has been more proactive in trying to offload. [name], [name] ([id number]) 04/12/18 on evaluation today patient actually appears to be doing a little better in regard to his wound and the right gluteal fold region. He's been tolerating the dressing changes since removing the oasis without complication. However he was having a lot of burning initially with the oasis in place. He's unsure of exactly why this was given so much discomfort but he assumes that it was the oasis itself causing the problem. Nonetheless this had to be removed after about three days in place although even those three days seem to have made a fairly good improvement in regard to the overall appearance of the wound bed. In fact is the first time that he's made any improvement from the standpoint of measurements in about six weeks. He continues to have no discomfort over the area of the wound itself which leads me to wonder why he was having the burning with the oasis when he does not even feel the actual debridement's themselves. I am somewhat perplexed by this. 04/19/18 on evaluation today patient's wound actually appears to be showing signs of epithelialization around the edge of the wound and in general actually appears to be doing better which is good news. He did have the same burning after about three days with applying the Endoform last week in the same fashion that I would generally apply a skin substitute. This seems to indicate that it's not the oasis to cause the problem but potentially the moisture buildup that just causes things to burn or there may be some other reaction with the skin prep or Steri-Strips. Nonetheless I'm not sure that is gonna be able to tolerate any skin substitute for a long period of time. The good news is the wound actually appears to be doing better today  compared to last week and does seem to finally be making some progress. 04/26/18 on evaluation today patient actually appears to be doing rather well in regard to his ulcer in the right gluteal fold. He has been tolerating the dressing changes without complication which is good news. The Endoform does seem to be helping although he was a little bit more macerated this week. This  seems to be an ongoing issue with fluid control at this point. Nonetheless I think we may be able to add something like Drawtex to help control the drainage. 05/03/18 on evaluation today patient appears to actually be doing better in regard to the overall appearance of his wound. He has been tolerating the dressing changes without complication. Fortunately there appears to be no evidence of infection at this time. I really feel like his wound has shown signs as of today of turning around last week I thought so as well and definitely he could be seen in this week's overall appearance and measurements. In general I'm very pleased with the fact that he finally seems to be making a steady but sure progress. The patient likewise is very pleased. 05/17/18 on evaluation today patient appears to be doing more poorly unfortunately in regard to his ulcer. He has been tolerating the dressing changes without complication. With that being said he tells me that in the past couple of days he and his wife have noticed that we did not seem to be doing quite as well is getting dark near the center. Subsequently upon evaluation today the wound actually does appear to be doing worse compared to previous. He has been tolerating the dressing changes otherwise and he states that he is not been sitting up anymore than he was in the past from what he tells me. Still he has continued to work he states "I'm tired of dealing with this and if I have to just go home and lay in the bed all the time that's what I'll do". Nonetheless I am concerned about the  fact that this wound does appear to be deeper than what it was previous. 05/24/18 upon evaluation today patient actually presents after having been in the hospital due to what was presumed to be sepsis secondary to the wound infection. He had an elevated white blood cell count between 14 and 15. With that being said he does seem to be doing somewhat better now. His wound still is giving him some trouble nonetheless and he is obviously concerned about the fact likely talked about that this does seem to go more deeply than previously noted. I did review his wound culture which showed evidence of Staphylococcus aureus him and group B strep. Nonetheless he is on antibiotics, Levaquin, for this. Subsequently I did review his intake summary from the hospital as well. I also did look at the CT of the lumbar spine with contrast that was performed which showed no bone destruction to suggest lumbar disguises/osteomyelitis or sacral osteomyelitis. There was no paraspinal abscess. Nonetheless it appears this may have been more of just a soft tissue infection at this point which is good news. He still is nonetheless concerned about the wound which again I think is completely reasonable considering everything he's been through recently. 05/31/18 on evaluation today on evaluation today patient actually appears to be showing signs of his wound be a little bit deeper than what I would like to see. Fortunately he does not show any signs of significant infection although his temperature was 99 today he states he's been checking this at home and has not been elevated. Nonetheless with the undermining that I'm seeing at this point I am becoming more concerned about the wound I do think that offloading is a key factor here that is preventing the speedy recovery at this point. There does not appear to be any evidence of again over infection noted. He's been using  Santyl currently. 06/07/18 the patient presents today for  follow-up evaluation regarding the left ulcer in the gluteal region. He has been tolerating the Wound VAC fairly well. He is obviously very frustrated with this he states that to mean is really getting in his way. There does not appear to be any evidence of infection at this time he does have a little bit of odor I do not necessarily associate this with infection just something that we sometimes notice with Wound VAC therapy. With that being said I can definitely catch a tone of discontentment overall in the patient's demeanor today. This when he was previously in the hospital [name], [name]. ([id number]) an CT scan was done of the lumbar region which did not reveal any signs of osteomyelitis. With that being said the pelvis in particular was not evaluated distinctly which means he could still have some osteonecrosis I. Nonetheless the Wound VAC was started on Thursday I do want to get this little bit more time before jumping to a CT scan of the pelvis although that is something that I might would recommend if were not see an improvement by that time. 06/14/18 on evaluation today patient actually appears to be doing about the same in regard to his right gluteal ulcer. Again he did have a CT scan of the lumbar spine unfortunately this did not include the pelvis. Nonetheless with the depth of the wound that I'm seeing today even despite the fact that I'm not seeing any evidence of overt cellulitis I believe there's a good chance that we may be dealing with osteomyelitis somewhere in the right Ischial region. No fevers, chills, nausea, or vomiting noted at this time. Electronic Signature(s) Signed: 06/14/2018 5:22:53 PM By: Worthy Keeler [name] Entered By: Worthy Keeler on 06/14/2018 11:35:30 KAYLEB, WARSHAW ([id number]) -------------------------------------------------------------------------------- Physical Exam Details Patient Name: [name] Date of Service: 06/14/2018  10:00 AM Medical Record Number: [id number] Patient Account Number: [id number] Date of Birth/Sex: 1951/06/25 (67 y.o. M) Treating RN: Harold Barban Primary Care Provider: [name] Other Clinician: Referring Provider: [name] Treating Provider/Extender: [name], HOYT Weeks in Treatment: 29 Constitutional Well-nourished and well-hydrated in no acute distress. Respiratory normal breathing without difficulty. clear to auscultation bilaterally. Cardiovascular regular rate and rhythm with normal S1, S2. Psychiatric this patient is able to make decisions and demonstrates good insight into disease process. Alert and Oriented x 3. pleasant and cooperative. Notes Upon inspection today patient's wound bed actually showed a fairly good granular surface which is good news. With that being said he still does have some depth as far as the tunnel is concerned. There did not appear to be any evidence of overt cellulitis/infection at this time which is good news. Nonetheless I am still concerned about what I'm seeing at this point with the depth of this tunnel going down 5 cm at this point. It feels like it is very close to the Ischial region on the right. Electronic Signature(s) Signed: 06/14/2018 5:22:53 PM By: Worthy Keeler [name] Entered By: Worthy Keeler on 06/14/2018 11:36:19 [name], [name] ([id number]) -------------------------------------------------------------------------------- Physician Orders Details Patient Name: [name] Date of Service: 06/14/2018 10:00 AM Medical Record Number: [id number] Patient Account Number: [id number] Date of Birth/Sex: 1951-06-19 (67 y.o. 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 Primary Wound Dressing Wound #1 Right Gluteal fold o Silver Collagen Secondary Dressing Wound #1 Right Gluteal fold o Boardered Foam Dressing Dressing Change Frequency Wound #1 Right Gluteal fold o Change Dressing Monday, Wednesday, Friday Follow-up Appointments Wound #1 Right Gluteal fold o Return Appointment in 1 week. Off-Loading Wound #1 Right Gluteal fold o Mattress - Order for Semi-electric hospital bed with half rails. o Turn and reposition every 2 hours Home Health Wound #1 Right Gluteal fold o [place] to Initiate Wound Vac and appropriate mattress for patient o Home Health Nurse may visit PRN to address patientos wound care needs. - Please bridge to hip GERMAN, MANKE ([id number]) o FACE TO FACE ENCOUNTER: MEDICARE and MEDICAID PATIENTS: I certify that this patient is under my care and that I had a face-to-face encounter that meets the physician face-to-face encounter requirements with this patient on this date. The encounter with the patient was in whole or in part for the following MEDICAL CONDITION: (primary reason for [place]) MEDICAL NECESSITY: I certify, that based on my findings, NURSING services are a medically necessary home health service. HOME BOUND STATUS: I certify that my clinical findings support that this patient is homebound (i.e., Due to illness or injury, pt requires aid of supportive devices such as crutches, cane, wheelchairs, walkers, the use of special transportation or  the assistance of another person to leave their place of residence. There is a normal inability to leave the home and doing so requires considerable and taxing effort. Other absences are for medical reasons / religious services and are infrequent or of short duration when for other reasons). o If current dressing causes regression in wound condition, may D/C ordered dressing product/s and apply Normal Saline Moist Dressing daily until next [place] / Other MD appointment. [place] of regression in wound condition at [phone]. o Please direct any NON-WOUND related issues/requests for orders to patient's Primary Care Physician Negative Pressure Wound Therapy Wound #1 Right Gluteal fold o Wound VAC settings at 125/130 mmHg continuous pressure. Use BLACK/GREEN foam to wound cavity. Use WHITE foam to fill any tunnel/s and/or undermining. Change VAC dressing 3 X WEEK. Change canister as indicated when full. Nurse may titrate settings and frequency of dressing changes as clinically indicated. - Silver collagen to base of wound o Home Health Nurse may d/c VAC for s/s of increased infection, significant wound regression, or uncontrolled drainage. [place] at [phone]. o Number of foam/gauze pieces used in the dressing = Medications-please add to medication list. Wound #1 Right Gluteal fold o P.O. Antibiotics - Complete Levaquin Radiology o Computed Tomography (CT) Scan - CT Scan pelvis Electronic Signature(s) Signed: 06/16/2018 12:54:11 AM By: Worthy Keeler [name] Signed: 06/16/2018 3:52:14 PM By: Harold Barban Previous Signature: 06/14/2018 5:22:53 PM Version By: Worthy Keeler [name] Entered By: Harold Barban on 06/15/2018 11:51:22 [name], [name] ([id number]) -------------------------------------------------------------------------------- Problem List Details Patient Name: [name] Date of Service:  06/14/2018 10:00 AM Medical Record Number: [id number] Patient Account Number: [id number] Date of Birth/Sex: 02/02/1951 (67 y.o. 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, 11/30/2017 No Yes sequela I10 Essential (primary) hypertension 11/30/2017 No Yes Inactive Problems Resolved Problems Electronic Signature(s) Signed: 06/15/2018 9:40:18 AM By: Worthy Keeler [name] Previous Signature: 06/14/2018 5:22:53 PM Version By: Worthy Keeler [name] Entered By: Worthy Keeler on 06/15/2018 09:38:27 [name], [name] ([id number]) -------------------------------------------------------------------------------- Progress Note Details Patient Name: [name] Date of Service: 06/14/2018 10:00 AM Medical Record Number: [id number] Patient Account Number: [id number] Date of Birth/Sex: [birth year] (67 y.o. 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 He states that there is no history of diabetes that he is aware of. He does have issues with his prostate  and is currently receiving treatment for this by way of oral medication. With that being said I do not have a lot of details in that regard. Nonetheless the patient presents today as a result of having been referred to [place] by another provider initially home health was set to come out and take care of his wound although due to the fact that he apparently drives he's not able to receive home health. His wife is therefore trying to help take care of this wound within although they have been struggling with what exactly to do at this point. She states that she can do some things but she is definitely not a nurse and does have some issues with looking at blood. The good news is the wound does not appear to be too deep and is fairly superficial at this point. There is no slough noted there is some nonviable skin noted around the surface of the wound and the perimeter at this point. The central portion of the wound appears to be very good with a dermal layer noted this does not appear to be again deep enough to extend it to subcutaneous tissue at this point. 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 In the pump he has Dilaudid and baclofen. 12/07/17 on evaluation today patient presents for follow-up concerning his ongoing lower back thigh ulcer on the right. He states that he did not get the supplies ordered and therefore has not really been able to perform the dressing changes as directed exactly. His wife was able to get some Boarder Foam Dressing's from the drugstore and subsequently has been using hydrogel which did help to a degree in the wound does appear to be able smaller. There is actually more drainage this week noted than previous. 12/21/17 on evaluation today patient appears to be doing rather well in regard to his right gluteal ulcer. He has been tolerating the dressing changes without  complication. There does not appear to  be any evidence of infection at this point in time. Overall the wound does seem to be making some progress as far as the edges are concerned there's not as much in the way of overlapping of the external wound edges and he has a good epithelium to wound bed border for the most part. This however is not true right at the 12 o'clock location over the span of a little over a centimeters which actually will require debridement today to clean this away and hopefully allow it to continue to heal more appropriately. 12/28/17 on evaluation today patient appears to be doing rather well in regard to his ulcer in the left gluteal region. He's been tolerating the dressing changes without complication. Apparently he has had some difficulty getting his dressing material. Apparently there's been some confusion with ordering we're gonna check into this. Nonetheless overall he's been showing signs of improvement which is good news. Debridement is not required today. 01/04/18 on evaluation today patient presents for follow-up concerning his right gluteal ulcer. He has been tolerating the dressing changes fairly well. On inspection today it appears he may actually have some maceration them concerned about the fact that he may be developing too much moisture in and around the wound bed which can cause delay in healing. With that being said he unfortunately really has not showed significant signs of improvement since last week's evaluation in fact this may even be just the little bit/slightly larger. Nonetheless he's been having a lot of discomfort I'm not sure this is even related to the wound as he has no pain when I'm to breeding or otherwise cleaning the wound during evaluation today. Nonetheless this is something that we did recommend he talked to his pain specialist concerning. 01/11/18 on evaluation today patient appears to be doing better in regard to his ulceration. He has been  tolerating the dressing Boling, Jonhatan J. ([id number]) changes without complication. With that being said overall there's no evidence of infection which is good news. The only thing is he did receive the hatch affair blue classic versus the ready nonetheless I feel like this is perfectly fine and appears to have done well for him over the past week. 01/25/18 on evaluation today patient's wound actually appears to be a little bit larger than during the last evaluation. The good news is the majority of the wound edges actually appear to be fairly firmly attached to the wound bed unfortunately again we're not really making progress in regard to the size. Roughly the wound is about the same size as when I first saw him although again the wound margin/edges appear to be much better. 02/01/18 on evaluation today patient actually appears to be doing very well in regard to his wound. Applying the Prisma dry does seem to be better although he does still have issues with slow progression of the wound. There was a slight improvement compared to last week's measurements today. Nonetheless I have been considering other options as far as the possibility of Theraskin or even a snap vac. In general I'm not sure that the Theraskin due to location of the wound would be a very good idea. Nonetheless I do think that a snap vac could be a possibility for the patient and in fact I think this could even be an excellent way to manage the wound possibly seeing some improvement in a very rapid fashion here. Nonetheless this is something that we would need to get approved and I did have a lengthy  conversation with the patient about this today. 02/08/18 on evaluation today patient appears to be doing a little better in regard to his ulcer. He has been tolerating the dressing changes without complication. Fortunately despite the fact that the wound is a little bit smaller it's not significantly so unfortunately. We have  discussed the possibility of a snap vac we did check with insurance this is actually covered at this point. Fortunately there does not appear to be any sign of infection. Overall I'm fairly pleased with how things seem to be appearing at this point. 02/15/18 on evaluation today patient appears to be doing rather well in regard to his right gluteal ulcer. Unfortunately the snap vac did not stay in place with his sheer and friction this came loose and did not seem to maintain seal very well. He worked for about two days and it did seem to do very well during that time according to his wife but in general this does not seem to be something that's gonna be beneficial for him long-term. I do believe we need to go back to standard dressings to see if we can find something that will be of benefit. 03/02/18- He is here in follow up evaluation; there is minimal change in the wound. He will continue with the same treatment plan, would consider changing to iodosrob/iodoflex if ulcer continues to to plateau. He will follow up next week 03/08/18 on evaluation today patient's wound actually appears to be about the same size as when I previously saw him several weeks back. Unfortunately he does have some slightly dark discoloration in the central portion of the wound which has me concerned about pressure injury. I do believe he may be sitting for too long a period of time in fact he tells me that "I probably sit for much too long". He does have some Slough noted on the surface of the wound and again as far as the size of the wound is concerned I'm really not seeing anything that seems to have improved significantly. 03/15/18 on evaluation today patient appears to be doing fairly well in regard to his ulcer. The wound measured pretty much about the same today compared to last week's evaluation when looking at his graph. With that being said the area of bruising/deep tissue injury that was noted last week I do not see  at this point. He did get a new cushion fortunately this does seem to be have been of benefit in my pinion. It does appear that he's been off of this more which is good news as well I think that is definitely showing in the overall wound measurements. With that being said I do believe that he needs to continue to offload I don't think that the fact this is doing better should be or is going to allow him to not have to offload and explain this to him as well. Overall he seems to be in agreement the plan I think he understands. The overall appearance of the wound bed is improved compared to last week I think the Iodoflex has been beneficial in that regard. 03/29/18 on evaluation today patient actually appears to be doing rather well in regard to his wound from the overall appearance standpoint he does have some granulation although there's some Slough on the surface of the wound noted as well. With that being said he unfortunately has not improved in regard to the overall measurement of the wound in volume or in size. I did have a  discussion with him very specifically about offloading today. He actually does work although he mainly is just sitting throughout the day. He tells me he offloads by "lifting himself up for 30 seconds off of his chair occasionally" purchase from [employer] which does seem to have helped. And he has a new cushion that he with that being said he's also able to stand some for a very short period of time but not significant enough I think to provide appropriate offloading. I think the biggest issue at this point with the wound and the fact is not healing as quickly as we would like is due to the fact that he is really not able to appropriately offload while at work. He states the beginning after his injury he actually had a bed at his job that he could lay on in order to offload and that does seem to have been of help back at that time. Nonetheless he had not done this in  quite some time unfortunately. I think that could be helpful for him this is something I would like for him to look into. [name], [name] ([id number]) 04/05/18 on evaluation today patient actually presents for follow-up concerning his right gluteal ulcer. Again he really is not significantly improved even compared to last week. He has been tolerating the dressing changes without complication. With that being said fortunately there appears to be no evidence of infection at this time. He has been more proactive in trying to offload. 04/12/18 on evaluation today patient actually appears to be doing a little better in regard to his wound and the right gluteal fold region. He's been tolerating the dressing changes since removing the oasis without complication. However he was having a lot of burning initially with the oasis in place. He's unsure of exactly why this was given so much discomfort but he assumes that it was the oasis itself causing the problem. Nonetheless this had to be removed after about three days in place although even those three days seem to have made a fairly good improvement in regard to the overall appearance of the wound bed. In fact is the first time that he's made any improvement from the standpoint of measurements in about six weeks. He continues to have no discomfort over the area of the wound itself which leads me to wonder why he was having the burning with the oasis when he does not even feel the actual debridement's themselves. I am somewhat perplexed by this. 04/19/18 on evaluation today patient's wound actually appears to be showing signs of epithelialization around the edge of the wound and in general actually appears to be doing better which is good news. He did have the same burning after about three days with applying the Endoform last week in the same fashion that I would generally apply a skin substitute. This seems to indicate that it's not the oasis to cause the  problem but potentially the moisture buildup that just causes things to burn or there may be some other reaction with the skin prep or Steri-Strips. Nonetheless I'm not sure that is gonna be able to tolerate any skin substitute for a long period of time. The good news is the wound actually appears to be doing better today compared to last week and does seem to finally be making some progress. 04/26/18 on evaluation today patient actually appears to be doing rather well in regard to his ulcer in the right gluteal fold. He has been tolerating the dressing changes  without complication which is good news. The Endoform does seem to be helping although he was a little bit more macerated this week. This seems to be an ongoing issue with fluid control at this point. Nonetheless I think we may be able to add something like Drawtex to help control the drainage. 05/03/18 on evaluation today patient appears to actually be doing better in regard to the overall appearance of his wound. He has been tolerating the dressing changes without complication. Fortunately there appears to be no evidence of infection at this time. I really feel like his wound has shown signs as of today of turning around last week I thought so as well and definitely he could be seen in this week's overall appearance and measurements. In general I'm very pleased with the fact that he finally seems to be making a steady but sure progress. The patient likewise is very pleased. 05/17/18 on evaluation today patient appears to be doing more poorly unfortunately in regard to his ulcer. He has been tolerating the dressing changes without complication. With that being said he tells me that in the past couple of days he and his wife have noticed that we did not seem to be doing quite as well is getting dark near the center. Subsequently upon evaluation today the wound actually does appear to be doing worse compared to previous. He has been tolerating  the dressing changes otherwise and he states that he is not been sitting up anymore than he was in the past from what he tells me. Still he has continued to work he states "I'm tired of dealing with this and if I have to just go home and lay in the bed all the time that's what I'll do". Nonetheless I am concerned about the fact that this wound does appear to be deeper than what it was previous. 05/24/18 upon evaluation today patient actually presents after having been in the hospital due to what was presumed to be sepsis secondary to the wound infection. He had an elevated white blood cell count between 14 and 15. With that being said he does seem to be doing somewhat better now. His wound still is giving him some trouble nonetheless and he is obviously concerned about the fact likely talked about that this does seem to go more deeply than previously noted. I did review his wound culture which showed evidence of Staphylococcus aureus him and group B strep. Nonetheless he is on antibiotics, Levaquin, for this. Subsequently I did review his intake summary from the hospital as well. I also did look at the CT of the lumbar spine with contrast that was performed which showed no bone destruction to suggest lumbar disguises/osteomyelitis or sacral osteomyelitis. There was no paraspinal abscess. Nonetheless it appears this may have been more of just a soft tissue infection at this point which is good news. He still is nonetheless concerned about the wound which again I think is completely reasonable considering everything he's been through recently. 05/31/18 on evaluation today on evaluation today patient actually appears to be showing signs of his wound be a little bit deeper than what I would like to see. Fortunately he does not show any signs of significant infection although his temperature was 99 today he states he's been checking this at home and has not been elevated. Nonetheless with the undermining  that I'm seeing at this point I am becoming more concerned about the wound I do think that offloading is a key factor here  that is preventing the speedy recovery at this point. There does not appear to be any evidence of again over infection noted. He's been using Santyl currently. [name], [name] ([id number]) 06/07/18 the patient presents today for follow-up evaluation regarding the left ulcer in the gluteal region. He has been tolerating the Wound VAC fairly well. He is obviously very frustrated with this he states that to mean is really getting in his way. There does not appear to be any evidence of infection at this time he does have a little bit of odor I do not necessarily associate this with infection just something that we sometimes notice with Wound VAC therapy. With that being said I can definitely catch a tone of discontentment overall in the patient's demeanor today. This when he was previously in the hospital an CT scan was done of the lumbar region which did not reveal any signs of osteomyelitis. With that being said the pelvis in particular was not evaluated distinctly which means he could still have some osteonecrosis I. Nonetheless the Wound VAC was started on Thursday I do want to get this little bit more time before jumping to a CT scan of the pelvis although that is something that I might would recommend if were not see an improvement by that time. 06/14/18 on evaluation today patient actually appears to be doing about the same in regard to his right gluteal ulcer. Again he did have a CT scan of the lumbar spine unfortunately this did not include the pelvis. Nonetheless with the depth of the wound that I'm seeing today even despite the fact that I'm not seeing any evidence of overt cellulitis I believe there's a good chance that we may be dealing with osteomyelitis somewhere in the right Ischial region. No fevers, chills, nausea, or vomiting noted at this time. Patient  History Information obtained from Patient. Family History Hypertension - Father, Stroke - Mother, No family history of Cancer, Diabetes, Heart Disease, Kidney Disease, Lung Disease, Seizures, Thyroid Problems, Tuberculosis. Social History Never smoker, Marital Status - Married, Alcohol Use - Never, Drug Use - No History, Caffeine Use - Daily. Medical And Surgical History Notes Oncologic Prostate cancer- currently treated with horomone therapy Review of Systems (ROS) Constitutional Symptoms (General Health) Denies complaints or symptoms of Fever, Chills. Respiratory The patient has no complaints or symptoms. Cardiovascular The patient has no complaints or symptoms. Psychiatric The patient has no complaints or symptoms. Objective Constitutional Well-nourished and well-hydrated in no acute distress. Vitals Time Taken: 9:58 AM, Height: 73 in, Weight: 210 lbs, BMI: 27.7, Temperature: 98.6 F, Pulse: 89 bpm, Respiratory Rate: 16 breaths/min, Blood Pressure: 132/79 mmHg. Respiratory Sprung, Dale J. ([id number]) normal breathing without difficulty. clear to auscultation bilaterally. Cardiovascular regular rate and rhythm with normal S1, S2. Psychiatric this patient is able to make decisions and demonstrates good insight into disease process. Alert and Oriented x 3. pleasant and cooperative. General Notes: Upon inspection today patient's wound bed actually showed a fairly good granular surface which is good news. With that being said he still does have some depth as far as the tunnel is concerned. There did not appear to be any evidence of overt cellulitis/infection at this time which is good news. Nonetheless I am still concerned about what I'm seeing at this point with the depth of this tunnel going down 5 cm at this point. It feels like it is very close to the Ischial region on the right. Integumentary (Hair, Skin) Wound #1 status is Open. Original  cause of wound was Pressure  Injury. The wound is located on the Right Gluteal fold. The wound measures 4cm length x 3.4cm width x 2.3cm depth; 10.681cm^2 area and 24.567cm^3 volume. There is Fat Layer (Subcutaneous Tissue) Exposed exposed. There is no undermining noted, however, there is tunneling at 6:00 with a maximum distance of 5cm. There is a medium amount of serous drainage noted. The wound margin is flat and intact. There is medium (34-66%) pink granulation within the wound bed. There is a medium (34-66%) amount of necrotic tissue within the wound bed including Eschar and Adherent Slough. The periwound skin appearance exhibited: Maceration, Erythema. The periwound skin appearance did not exhibit: Callus, Crepitus, Excoriation, Induration, Rash, Scarring, Dry/Scaly, Atrophie Blanche, Cyanosis, Ecchymosis, Hemosiderin Staining, Mottled, Pallor, Rubor. The surrounding wound skin color is noted with erythema which is circumferential. Periwound temperature was noted as No Abnormality. 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 Primary Wound Dressing: Wound #1 Right Gluteal fold: Silver Collagen Secondary Dressing: Wound #1 Right Gluteal fold: [name], [name] ([id number]) Boardered Foam Dressing Dressing Change Frequency: Wound #1 Right Gluteal fold: Change Dressing Monday, Wednesday, Friday Follow-up Appointments: Wound #1 Right Gluteal fold: Return Appointment in 1 week. Off-Loading: Wound #1 Right Gluteal fold: Mattress - HHRN order mattress for patient Turn and reposition every 2 hours Home  Health: Wound #1 Right Gluteal fold: Continue Home Health Visits - [place] to Initiate Wound Vac and appropriate mattress for patient Home Health Nurse may visit PRN to address patient s wound care needs. - Please bridge to hip FACE TO FACE ENCOUNTER: MEDICARE and MEDICAID PATIENTS: I certify that this patient is under my care and that I had a face-to-face encounter that meets the physician face-to-face encounter requirements with this patient on this date. The encounter with the patient was in whole or in part for the following MEDICAL CONDITION: (primary reason for [place]) MEDICAL NECESSITY: I certify, that based on my findings, NURSING services are a medically necessary home health service. HOME BOUND STATUS: I certify that my clinical findings support that this patient is homebound (i.e., Due to illness or injury, pt requires aid of supportive devices such as crutches, cane, wheelchairs, walkers, the use of special transportation or the assistance of another person to leave their place of residence. There is a normal inability to leave the home and doing so requires considerable and taxing effort. Other absences are for medical reasons / religious services and are infrequent or of short duration when for other reasons). If current dressing causes regression in wound condition, may D/C ordered dressing product/s and apply Normal Saline Moist Dressing daily until next [place] / Other MD appointment. [place] of regression in wound condition at [phone]. Please direct any NON-WOUND related issues/requests for orders to patient's Primary Care Physician Negative Pressure Wound Therapy: Wound #1 Right Gluteal fold: Wound VAC settings at 125/130 mmHg continuous pressure. Use BLACK/GREEN foam to wound cavity. Use WHITE foam to fill any tunnel/s and/or undermining. Change VAC dressing 3 X WEEK. Change canister as indicated when full. Nurse may titrate  settings and frequency of dressing changes as clinically indicated. - Silver  collagen to base of wound Home Health Nurse may d/c VAC for s/s of increased infection, significant wound regression, or uncontrolled drainage. [place] at [phone]. Number of foam/gauze pieces used in the dressing = Medications-please add to medication list.: Wound #1 Right Gluteal fold: P.O. Antibiotics - Complete Levaquin Radiology ordered were: Computed Tomography (CT) Scan - CT Scan pelvis I am going to recommend at this point that we go ahead and initiate the above wound care measures for the next week. The patient is in agreement with plan. Subsequently we will see were things stand at follow-up. If anything changes or worsens in the interim patient will contact the office and let me know. I am going to go ahead and order a CT scan with and without contrast to evaluate for the possibility of right Ischial osteomyelitis. Please see above for specific wound care orders. We will see patient for re-evaluation in 1 week(s) here in the clinic. If anything worsens or changes patient will contact our office for additional recommendations. Electronic Signature(s) Signed: 06/15/2018 9:57:28 AM By: Gretta Cool, BSN, RN, CWS, Kim RN, BSN Signed: 06/16/2018 12:54:11 AM By: Worthy Keeler [name] Previous Signature: 06/15/2018 9:40:18 AM Version By: Worthy Keeler [name] Previous Signature: 06/14/2018 5:22:53 PM Version By: Conchita Paris, CUNG MASTERSON ([id number]) Entered By: Gretta Cool BSN, RN, CWS, Kim on 06/15/2018 09:57:28 VASCO, CHONG ([id number]) -------------------------------------------------------------------------------- ROS/PFSH Details Patient Name: [name] Date of Service: 06/14/2018 10:00 AM Medical Record Number: [id number] Patient Account Number: [id number] Date of Birth/Sex: 1950/09/02 (67 y.o. M) Treating RN: Harold Barban Primary Care Provider: [name] Other Clinician: Referring Provider: [name] Treating Provider/Extender: [name], HOYT Weeks in Treatment: 28 Information Obtained From Patient Wound History Do you currently have one or more open woundso Yes How many open wounds do you currently haveo 1 Approximately how long have you had your woundso 1 month How have you been treating your wound(s) until nowo cream Has your wound(s) ever healed and then re-openedo No Have you had any lab work done in the past montho No Have you tested positive for an antibiotic resistant organism (MRSA, VRE)o No Have you tested positive for osteomyelitis (bone infection)o No Have you had any tests for circulation on your legso No Constitutional Symptoms (General Health) Complaints and Symptoms: Negative for: Fever; Chills Eyes Medical History: Positive for: Cataracts - both removed Negative for: Glaucoma; Optic Neuritis Ear/Nose/Mouth/Throat Medical History: Negative for: Chronic sinus problems/congestion; Middle ear problems Hematologic/Lymphatic Medical History: Negative for: Anemia; Hemophilia; Human Immunodeficiency Virus; Lymphedema Respiratory Complaints and Symptoms: No Complaints or Symptoms Medical History: Negative for: Aspiration; Asthma; Chronic Obstructive Pulmonary Disease (COPD); Pneumothorax; Sleep Apnea; Tuberculosis Cardiovascular Complaints and Symptoms: No Complaints or Symptoms Medical History: [name], [name] ([id number]) Positive for: Hypertension - takes medication Negative for: Angina; Arrhythmia; Congestive Heart Failure; Coronary Artery Disease; Deep Vein Thrombosis; Hypotension; Myocardial Infarction; Peripheral Arterial Disease; Peripheral Venous Disease; Phlebitis; Vasculitis Gastrointestinal Medical History: Negative for: Cirrhosis ; Colitis; Crohnos; Hepatitis A; Hepatitis B; Hepatitis C Endocrine Medical History: Negative for: Type I Diabetes; Type II  Diabetes Genitourinary Medical History: Negative for: End Stage Renal Disease Immunological Medical History: Negative for: Lupus Erythematosus; Raynaudos; Scleroderma Integumentary (Skin) Medical History: Negative for: History of Burn; History of pressure wounds Musculoskeletal Medical History: Negative for: Gout; Rheumatoid Arthritis; Osteoarthritis; Osteomyelitis Neurologic Medical History: Positive for: Paraplegia - waist down Negative for: Dementia; Neuropathy; Quadriplegia; Seizure Disorder Oncologic Medical History: Negative for: Received Chemotherapy; Received Radiation Past  Medical History Notes: Prostate cancer- currently treated with horomone therapy Psychiatric Complaints and Symptoms: No Complaints or Symptoms Medical History: Negative for: Anorexia/bulimia; Confinement Anxiety HBO Extended History Items Eyes: Cataracts [name], [name]. ([id number]) Immunizations Pneumococcal Vaccine: Received Pneumococcal Vaccination: No Implantable Devices Family and Social History Cancer: No; Diabetes: No; Heart Disease: No; Hypertension: Yes - Father; Kidney Disease: No; Lung Disease: No; Seizures: No; Stroke: Yes - Mother; Thyroid Problems: No; Tuberculosis: No; Never smoker; Marital Status - Married; Alcohol Use: Never; Drug Use: No History; Caffeine Use: Daily; Advanced Directives: Yes (Copy provided); Patient does not want information on Advanced Directives; Living Will: Yes (Copy provided) Physician Affirmation I have reviewed and agree with the above information. Electronic Signature(s) Signed: 06/14/2018 5:22:53 PM By: Worthy Keeler [name] Signed: 06/16/2018 3:52:14 PM By: Harold Barban Entered By: Worthy Keeler on 06/14/2018 11:35:56 [name], [name] ([id number]) -------------------------------------------------------------------------------- SuperBill Details Patient Name: [name] Date of Service: 06/14/2018 Medical Record Number:  [id number] Patient Account Number: [id number] Date of Birth/Sex: [birth year] (67 y.o. M) Treating RN: Harold Barban Primary Care Provider: [name] Other Clinician: Referring Provider: [name] Treating Provider/Extender: [name], HOYT Weeks in Treatment: 28 Diagnosis Coding ICD-10 Codes Code Description L89.314 Pressure ulcer of right buttock, stage 4 G82.20 Paraplegia, unspecified S34.109S Unspecified injury to unspecified level of lumbar spinal cord, sequela I10 Essential (primary) hypertension Facility Procedures CPT4 Code: 79038333 Description: 99213 - WOUND CARE VISIT-LEV 3 EST PT Modifier: Quantity: 1 Physician Procedures CPT4 Code: 8329191 Description: 66060 - WC PHYS LEVEL 4 - EST PT ICD-10 Diagnosis Description L89.314 Pressure ulcer of right buttock, stage 4 G82.20 Paraplegia, unspecified S34.109S Unspecified injury to unspecified level of lumbar spinal cor I10 Essential (primary)  hypertension Modifier: d, sequela Quantity: 1 Electronic Signature(s) Signed: 06/15/2018 10:07:02 AM By: Gretta Cool, BSN, RN, CWS, Kim RN, BSN Signed: 06/16/2018 12:54:11 AM By: Worthy Keeler [name] Previous Signature: 06/15/2018 9:40:18 AM Version By: Worthy Keeler [name] Previous Signature: 06/14/2018 5:22:53 PM Version By: Worthy Keeler [name] Entered By: Gretta Cool, BSN, RN, CWS, Kim on 06/15/2018 10:07:02

## 2018-06-21 ENCOUNTER — Encounter: Payer: Medicare Other | Attending: Physician Assistant | Admitting: Physician Assistant

## 2018-06-21 DIAGNOSIS — Z823 Family history of stroke: Secondary | ICD-10-CM | POA: Insufficient documentation

## 2018-06-21 DIAGNOSIS — E11622 Type 2 diabetes mellitus with other skin ulcer: Secondary | ICD-10-CM | POA: Diagnosis not present

## 2018-06-21 DIAGNOSIS — Z8546 Personal history of malignant neoplasm of prostate: Secondary | ICD-10-CM | POA: Diagnosis not present

## 2018-06-21 DIAGNOSIS — I10 Essential (primary) hypertension: Secondary | ICD-10-CM | POA: Diagnosis not present

## 2018-06-21 DIAGNOSIS — L89314 Pressure ulcer of right buttock, stage 4: Secondary | ICD-10-CM | POA: Insufficient documentation

## 2018-06-21 DIAGNOSIS — Z8249 Family history of ischemic heart disease and other diseases of the circulatory system: Secondary | ICD-10-CM | POA: Diagnosis not present

## 2018-06-21 DIAGNOSIS — G8220 Paraplegia, unspecified: Secondary | ICD-10-CM | POA: Insufficient documentation

## 2018-06-22 ENCOUNTER — Other Ambulatory Visit: Payer: Self-pay | Admitting: Physician Assistant

## 2018-06-22 DIAGNOSIS — B999 Unspecified infectious disease: Secondary | ICD-10-CM

## 2018-06-23 ENCOUNTER — Other Ambulatory Visit: Payer: Self-pay | Admitting: Physician Assistant

## 2018-06-23 DIAGNOSIS — L0231 Cutaneous abscess of buttock: Secondary | ICD-10-CM

## 2018-06-23 DIAGNOSIS — L03317 Cellulitis of buttock: Principal | ICD-10-CM

## 2018-06-24 ENCOUNTER — Other Ambulatory Visit: Payer: Self-pay | Admitting: Gastroenterology

## 2018-06-24 DIAGNOSIS — D519 Vitamin B12 deficiency anemia, unspecified: Secondary | ICD-10-CM

## 2018-06-24 DIAGNOSIS — R634 Abnormal weight loss: Secondary | ICD-10-CM

## 2018-06-24 DIAGNOSIS — L89310 Pressure ulcer of right buttock, unstageable: Secondary | ICD-10-CM

## 2018-06-28 ENCOUNTER — Ambulatory Visit
Admission: RE | Admit: 2018-06-28 | Discharge: 2018-06-28 | Disposition: A | Discharge status: Home / Self Care | Payer: Medicare Other | Source: Ambulatory Visit | Attending: Physician Assistant | Admitting: Physician Assistant

## 2018-06-28 ENCOUNTER — Encounter: Payer: Medicare Other | Admitting: Physician Assistant

## 2018-06-28 DIAGNOSIS — D519 Vitamin B12 deficiency anemia, unspecified: Secondary | ICD-10-CM | POA: Diagnosis present

## 2018-06-28 DIAGNOSIS — R634 Abnormal weight loss: Secondary | ICD-10-CM

## 2018-06-28 DIAGNOSIS — L89310 Pressure ulcer of right buttock, unstageable: Secondary | ICD-10-CM | POA: Insufficient documentation

## 2018-06-28 DIAGNOSIS — E11622 Type 2 diabetes mellitus with other skin ulcer: Secondary | ICD-10-CM | POA: Diagnosis not present

## 2018-06-28 MED ORDER — IOPAMIDOL (ISOVUE-300) INJECTION 61%
100.0000 mL | Freq: Once | INTRAVENOUS | Status: AC | PRN
  Administered 2018-06-28: 100 mL via INTRAVENOUS

## 2018-07-01 NOTE — Progress Notes (Signed)
[name], [name] ([id number]) Visit Report for 06/28/2018 Arrival Information Details Patient Name: [name] Date of Service: 06/28/2018 8:30 AM Medical Record Number: [id number] Patient Account Number: [id number] Date of Birth/Sex: 20-Nov-1950 (67 y.o. Male) Treating RN: Harold Barban Primary Care Tally Mattox: [name] Other Clinician: Referring Chelsi Warr: [name] Treating Alfonse Garringer/Extender: [name], HOYT Weeks in Treatment: 73 Visit Information History Since Last Visit Added or deleted any medications: No Patient Arrived: Wheel Chair Any new allergies or adverse reactions: No Arrival Time: 08:28 Had a fall or experienced change in No Accompanied By: wife activities of daily living that may affect Transfer Assistance: None risk of falls: Patient Identification Verified: Yes Signs or symptoms of abuse/neglect since last visito No Secondary Verification Process Completed: Yes Hospitalized since last visit: No Patient Requires Transmission-Based No Implantable device outside of the clinic excluding No Precautions: cellular tissue based products placed in the center Patient Has Alerts: Yes since last visit: Patient Alerts: NOT Has Dressing in Place as Prescribed: Yes Diabetic Pain Present Now: Yes Electronic Signature(s) Signed: 06/28/2018 1:50:03 PM By: Lorine Bears RCP, RRT, CHT Entered By: Becky Sax, Amado Nash on 06/28/2018 08:29:37 [name], [name] ([id number]) -------------------------------------------------------------------------------- Clinic Level of Care Assessment Details Patient Name: [name] Date of Service: 06/28/2018 8:30 AM Medical Record Number: [id number] Patient Account Number: [id number] Date of Birth/Sex: January 22, 1951 (67 y.o. Male) Treating RN: Harold Barban Primary Care Marlette Curvin: [name] Other Clinician: Referring Prajna Vanderpool: [name] Treating Yerachmiel Spinney/Extender: [name], HOYT Weeks in Treatment: 30 Clinic Level of Care Assessment Items TOOL 4 Quantity Score []  - Use when only an EandM is performed on FOLLOW-UP visit 0 ASSESSMENTS - Nursing Assessment / Reassessment X - Reassessment of Co-morbidities (includes updates in patient status) 1 10 X- 1 5 Reassessment of Adherence to Treatment Plan ASSESSMENTS - Wound and Skin Assessment / Reassessment X - Simple Wound Assessment / Reassessment - one wound 1 5 []  - 0 Complex Wound Assessment / Reassessment - multiple wounds []  - 0 Dermatologic / Skin Assessment (not related to wound area) ASSESSMENTS - Focused Assessment []  - Circumferential Edema Measurements - multi extremities 0 []  - 0 Nutritional Assessment / Counseling / Intervention []  - 0 Lower Extremity Assessment (monofilament, tuning fork, pulses) []  - 0 Peripheral Arterial Disease Assessment (using hand held doppler) ASSESSMENTS - Ostomy and/or Continence Assessment and Care []  - Incontinence Assessment and Management 0 []  - 0 Ostomy Care Assessment and Management (repouching, etc.) PROCESS - Coordination of Care X - Simple Patient / Family Education for ongoing care 1 15 []  - 0 Complex (extensive) Patient / Family Education for ongoing care X- 1 10 Staff obtains Programmer, systems, Records, Test Results / Process Orders []  - 0 Staff telephones HHA, Nursing Homes / Clarify orders / etc []  - 0 Routine Transfer to another Facility (non-emergent condition) []  - 0 Routine Hospital Admission (non-emergent condition) []  - 0 New Admissions / Biomedical engineer / Ordering NPWT, Apligraf, etc. []  - 0 Emergency Hospital Admission (emergent condition) X- 1 10 Simple Discharge Coordination [name]. ([id number]) []  - 0 Complex (extensive) Discharge Coordination PROCESS - Special Needs []  - Pediatric / Minor Patient Management 0 []  - 0 Isolation Patient Management []  - 0 Hearing / Language / Visual special needs []  -  0 Assessment of Community assistance (transportation, D/C planning, etc.) []  - 0 Additional assistance / Altered mentation []  - 0 Support Surface(s) Assessment (bed, cushion, seat, etc.) INTERVENTIONS - Wound [name] / Measurement X - Simple Wound [name] -  one wound 1 5 []  - 0 Complex Wound [name] - multiple wounds X- 1 5 Wound Imaging (photographs - any number of wounds) []  - 0 Wound Tracing (instead of photographs) X- 1 5 Simple Wound Measurement - one wound []  - 0 Complex Wound Measurement - multiple wounds INTERVENTIONS - Wound Dressings X - Small Wound Dressing one or multiple wounds 1 10 []  - 0 Medium Wound Dressing one or multiple wounds []  - 0 Large Wound Dressing one or multiple wounds []  - 0 Application of Medications - topical []  - 0 Application of Medications - injection INTERVENTIONS - Miscellaneous []  - External ear exam 0 []  - 0 Specimen Collection (cultures, biopsies, blood, body fluids, etc.) []  - 0 Specimen(s) / Culture(s) sent or taken to Lab for analysis []  - 0 Patient Transfer (multiple staff / Civil Service fast streamer / Similar devices) []  - 0 Simple Staple / Suture removal (25 or less) []  - 0 Complex Staple / Suture removal (26 or more) []  - 0 Hypo / Hyperglycemic Management (close monitor of Blood Glucose) []  - 0 Ankle / Brachial Index (ABI) - do not check if billed separately X- 1 5 Vital Signs [name], [name] ([id number]) Has the patient been seen at the hospital within the last three years: Yes Total Score: 85 Level Of Care: New/Established - Level 3 Electronic Signature(s) Signed: 06/29/2018 5:05:35 PM By: Harold Barban Entered By: Harold Barban on 06/28/2018 09:01:42 [name] ([id number]) -------------------------------------------------------------------------------- Encounter Discharge Information Details Patient Name: [name] Date of Service: 06/28/2018 8:30 AM Medical Record Number: [id number] Patient  Account Number: [id number] Date of Birth/Sex: [birth year] (67 y.o. Male) Treating RN: Montey Hora Primary Care Alois Mincer: [name] Other Clinician: Referring Kamrin Sibley: [name] Treating Lyndsey Demos/Extender: [name], HOYT Weeks in Treatment: 30 Encounter Discharge Information Items Discharge Condition: Stable Ambulatory Status: Wheelchair Discharge Destination: Other (Note Required) Telephoned: No Orders Sent: No Transportation: Private Auto Accompanied By: wife Schedule Follow-up Appointment: Yes Clinical Summary of Care: Notes patient is leaving our clinic and going to the hospital for his CT scan scheduled for 930 today Electronic Signature(s) Signed: 06/28/2018 4:59:42 PM By: Montey Hora Entered By: Montey Hora on 06/28/2018 09:08:06 [name] ([id number]) -------------------------------------------------------------------------------- Lower Extremity Assessment Details Patient Name: [name] Date of Service: 06/28/2018 8:30 AM Medical Record Number: [id number] Patient Account Number: [id number] Date of Birth/Sex: [birth year] (67 y.o. Male) Treating RN: Montey Hora Primary Care Hamda Klutts: [name] Other Clinician: Referring Sagal Gayton: [name] Treating Julien Oscar/Extender: [name], HOYT Weeks in Treatment: 30 Electronic Signature(s) Signed: 06/28/2018 4:59:42 PM By: Montey Hora Entered By: Montey Hora on 06/28/2018 08:38:43 [name], [name] ([id number]) -------------------------------------------------------------------------------- Multi Wound Chart Details Patient Name: [name] Date of Service: 06/28/2018 8:30 AM Medical Record Number: [id number] Patient Account Number: [id number] Date of Birth/Sex: April 02, 1951 (67 y.o. Male) Treating RN: Harold Barban Primary Care Lamoine Fredricksen: [name] Other Clinician: Referring Sondra Blixt: [name] Treating Japhet Morgenthaler/Extender: [name],  HOYT Weeks in Treatment: 30 Vital Signs Height(in): 73 Pulse(bpm): 90 Weight(lbs): 210 Blood Pressure(mmHg): 139/63 Body Mass Index(BMI): 28 Temperature(F): 98.3 Respiratory Rate 18 (breaths/min): Photos: [1:No Photos] [N/A:N/A] Wound Location: [1:Right Gluteal fold] [N/A:N/A] Wounding Event: [1:Pressure Injury] [N/A:N/A] Primary Etiology: [1:Pressure Ulcer] [N/A:N/A] Comorbid History: [1:Cataracts, Hypertension, Paraplegia] [N/A:N/A] Date Acquired: [1:11/02/2017] [N/A:N/A] Weeks of Treatment: [1:30] [N/A:N/A] Wound Status: [1:Open] [N/A:N/A] Measurements L x W x D [1:4.3x3.4x2.5] [N/A:N/A] (cm) Area (cm) : [1:11.483] [N/A:N/A] Volume (cm) : [1:28.706] [N/A:N/A] % Reduction in Area: [1:-14.20%] [N/A:N/A] % Reduction in Volume: [1:-2756.30%] [N/A:N/A]  Classification: [1:Category/Stage IV] [N/A:N/A] Exudate Amount: [1:Medium] [N/A:N/A] Exudate Type: [1:Serous] [N/A:N/A] Exudate Color: [1:amber] [N/A:N/A] Wound Margin: [1:Flat and Intact] [N/A:N/A] Granulation Amount: [1:Medium (34-66%)] [N/A:N/A] Granulation Quality: [1:Pink] [N/A:N/A] Necrotic Amount: [1:Medium (34-66%)] [N/A:N/A] Exposed Structures: [1:Fat Layer (Subcutaneous Tissue) Exposed: Yes Fascia: No Tendon: No Muscle: No Joint: No Bone: No] [N/A:N/A] Epithelialization: [1:None] [N/A:N/A] Periwound Skin Texture: [1:Excoriation: Yes Induration: No Callus: No Crepitus: No Rash: No Scarring: No] [N/A:N/A] Periwound Skin Moisture: Maceration: Yes N/A N/A Dry/Scaly: No Periwound Skin Color: Erythema: Yes N/A N/A Atrophie Blanche: No Cyanosis: No Ecchymosis: No Hemosiderin Staining: No Mottled: No Pallor: No Rubor: No Erythema Location: Circumferential N/A N/A Temperature: No Abnormality N/A N/A Tenderness on Palpation: No N/A N/A Wound Preparation: Ulcer [name]: N/A N/A Rinsed/Irrigated with Saline Topical Anesthetic Applied: Other: lidocaine 4% Treatment Notes Electronic Signature(s) Signed:  06/29/2018 5:05:35 PM By: Harold Barban Entered By: Harold Barban on 06/28/2018 08:56:20 [name], [name] ([id number]) -------------------------------------------------------------------------------- [place] Details Patient Name: [name] Date of Service: 06/28/2018 8:30 AM Medical Record Number: [id number] Patient Account Number: [id number] Date of Birth/Sex: 06/17/1951 (67 y.o. Male) Treating RN: Harold Barban Primary Care Iwao Shamblin: [name] Other Clinician: Referring Jeriyah Granlund: [name] Treating Kareli Hossain/Extender: [name], HOYT Weeks in Treatment: 30 Active Inactive Orientation to the Wound Care Program Nursing Diagnoses: Knowledge deficit related to the wound healing center program Goals: Patient/caregiver will verbalize understanding of the [facility] [place] Program Date Initiated: 11/30/2017 Target Resolution Date: 12/21/2017 Goal Status: Active Interventions: Provide education on orientation to the wound center Notes: Pressure Nursing Diagnoses: Knowledge deficit related to management of pressures ulcers Goals: Patient/caregiver will verbalize understanding of pressure ulcer management Date Initiated: 05/17/2018 Target Resolution Date: 05/28/2018 Goal Status: Active Interventions: Assess: immobility, friction, shearing, incontinence upon admission and as needed Notes: Wound/Skin Impairment Nursing Diagnoses: Impaired tissue integrity Goals: Patient/caregiver will verbalize understanding of skin care regimen Date Initiated: 11/30/2017 Target Resolution Date: 12/21/2017 Goal Status: Active Ulcer/skin breakdown will have a volume reduction of 30% by week 4 Date Initiated: 11/30/2017 Target Resolution Date: 12/21/2017 Goal Status: Active [name], [name] ([id number]) Interventions: Assess patient/caregiver ability to obtain necessary supplies Assess patient/caregiver ability to perform ulcer/skin care regimen  upon admission and as needed Assess ulceration(s) every visit Treatment Activities: Skin care regimen initiated : 11/30/2017 Notes: Electronic Signature(s) Signed: 06/29/2018 5:05:35 PM By: Harold Barban Entered By: Harold Barban on 06/28/2018 08:55:46 [name], [name] ([id number]) -------------------------------------------------------------------------------- Pain Assessment Details Patient Name: [name] Date of Service: 06/28/2018 8:30 AM Medical Record Number: [id number] Patient Account Number: [id number] Date of Birth/Sex: 02-18-1951 (67 y.o. Male) Treating RN: Harold Barban Primary Care Suheyb Raucci: [name] Other Clinician: Referring Zaray Gatchel: [name] Treating Gracelin Weisberg/Extender: [name], HOYT Weeks in Treatment: 30 Active Problems Location of Pain Severity and Description of Pain Patient Has Paino Yes Site Locations Rate the pain. Current Pain Level: 3 Pain Management and Medication Current Pain Management: Electronic Signature(s) Signed: 06/28/2018 1:50:03 PM By: Lorine Bears RCP, RRT, CHT Signed: 06/29/2018 5:05:35 PM By: Harold Barban Entered By: Lorine Bears on 06/28/2018 08:29:49 [name], [name] ([id number]) -------------------------------------------------------------------------------- Patient/Caregiver Education Details Patient Name: [name] Date of Service: 06/28/2018 8:30 AM Medical Record Number: [id number] Patient Account Number: [id number] Date of Birth/Gender: [birth year] (67 y.o. Male) Treating RN: Harold Barban Primary Care Physician: [name] Other Clinician: Referring Physician: [name] Treating Physician/Extender: Sharalyn Ink in Treatment: 30 Education Assessment Education Provided To: Patient Education Topics Provided Electronic Signature(s) Signed: 06/29/2018 5:05:35 PM By:  Biell, Kristina Entered By: Harold Barban on 06/28/2018  08:56:55 [name] ([id number]) -------------------------------------------------------------------------------- Wound Assessment Details Patient Name: [name], [name] Date of Service: 06/28/2018 8:30 AM Medical Record Number: [id number] Patient Account Number: [id number] Date of Birth/Sex: 02-05-1951 (67 y.o. Male) Treating RN: Montey Hora Primary Care Jatasia Gundrum: [name] Other Clinician: Referring Jerrelle Michelsen: [name] Treating Lochlin Eppinger/Extender: [name], HOYT Weeks in Treatment: 30 Wound Status Wound Number: 1 Primary Etiology: Pressure Ulcer Wound Location: Right Gluteal fold Wound Status: Open Wounding Event: Pressure Injury Comorbid History: Cataracts, Hypertension, Paraplegia Date Acquired: 11/02/2017 Weeks Of Treatment: 30 Clustered Wound: No Photos Photo Uploaded By: Montey Hora on 06/28/2018 11:09:16 Wound Measurements Length: (cm) 4.3 Width: (cm) 3.4 Depth: (cm) 2.5 Area: (cm) 11.483 Volume: (cm) 28.706 % Reduction in Area: -14.2% % Reduction in Volume: -2756.3% Epithelialization: None Tunneling: No Undermining: No Wound Description Classification: Category/Stage IV Foul O Wound Margin: Flat and Intact Slough Exudate Amount: Medium Exudate Type: Serous Exudate Color: amber [name]: No /Fibrino Yes Wound Bed Granulation Amount: Medium (34-66%) Exposed Structure Granulation Quality: Pink Fascia Exposed: No Necrotic Amount: Medium (34-66%) Fat Layer (Subcutaneous Tissue) Exposed: Yes Necrotic Quality: Adherent Slough Tendon Exposed: No Muscle Exposed: No Joint Exposed: No Bone Exposed: No Periwound Skin Texture Desa, Anita J. ([id number]) Texture Color No Abnormalities Noted: No No Abnormalities Noted: No Callus: No Atrophie Blanche: No Crepitus: No Cyanosis: No Excoriation: Yes Ecchymosis: No Induration: No Erythema: Yes Rash: No Erythema Location: Circumferential Scarring:  No Hemosiderin Staining: No Mottled: No Moisture Pallor: No No Abnormalities Noted: No Rubor: No Dry / Scaly: No Maceration: Yes Temperature / Pain Temperature: No Abnormality Wound Preparation Ulcer [name]: Rinsed/Irrigated with Saline Topical Anesthetic Applied: Other: lidocaine 4%, Treatment Notes Wound #1 (Right Gluteal fold) Notes prisma and bordered foam dressing Electronic Signature(s) Signed: 06/28/2018 4:59:42 PM By: Montey Hora Entered By: Montey Hora on 06/28/2018 08:45:58 [name], [name] ([id number]) -------------------------------------------------------------------------------- [place] Details Patient Name: [name] Date of Service: 06/28/2018 8:30 AM Medical Record Number: [id number] Patient Account Number: [id number] Date of Birth/Sex: 1951/01/09 (67 y.o. Male) Treating RN: Harold Barban Primary Care Girtie Wiersma: [name] Other Clinician: Referring Ikea Demicco: [name] Treating Raad Clayson/Extender: [name], HOYT Weeks in Treatment: 30 Vital Signs Time Taken: 08:29 Temperature (F): 98.3 Height (in): 73 Pulse (bpm): 90 Weight (lbs): 210 Respiratory Rate (breaths/min): 18 Body Mass Index (BMI): 27.7 Blood Pressure (mmHg): 139/63 Reference Range: 80 - 120 mg / dl Electronic Signature(s) Signed: 06/28/2018 1:50:03 PM By: Lorine Bears RCP, RRT, CHT Entered By: Becky Sax, Amado Nash on 06/28/2018 08:31:47

## 2018-07-01 NOTE — Progress Notes (Signed)
PHILIP, KOTLYAR ([id number]) Visit Report for 06/28/2018 Chief Complaint Document Details Patient Name: [name], [name] Date of Service: 06/28/2018 8:30 AM Medical Record Number: [id number] Patient Account Number: [id number] Date of Birth/Sex: [birth year] (67 y.o. Male) Treating RN: Harold Barban Primary Care Provider: [name] Other Clinician: Referring Provider: [name] Treating Provider/Extender: [name], Nury Nebergall Weeks in Treatment: 30 Information Obtained from: Patient Chief Complaint Right gluteal fold ulcer Electronic Signature(s) Signed: 06/29/2018 7:20:38 AM By: Worthy Keeler [name] Entered By: Worthy Keeler on 06/28/2018 08:29:16 [name], [name] ([id number]) -------------------------------------------------------------------------------- HPI Details Patient Name: Garlan [name] Date of Service: 06/28/2018 8:30 AM Medical Record Number: [id number] Patient Account Number: [id number] Date of Birth/Sex: [birth year] (67 y.o. 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 He states that there is no history of diabetes that he is aware of. He does have issues with his prostate and is currently receiving treatment for this by way of oral medication. With that being said I do not have a lot of details in that regard. Nonetheless the patient presents today as a result of having been referred to [place] by another provider initially home health was set to come out and take care of his wound although due to the  fact that he apparently drives he's not able to receive home health. His wife is therefore trying to help take care of this wound within although they have been struggling with what exactly to do at this point. She states that she can do some things but she is definitely not a nurse and does have some issues with looking at blood. The good news is the wound does not appear to be too deep and is fairly superficial at this point. There is no slough noted there is some nonviable skin noted around the surface of the wound and the perimeter at this point. The central portion of the wound appears to be very good with a dermal layer noted this does not appear to be again deep enough to extend it to subcutaneous tissue at this point. 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 In the pump he has Dilaudid and baclofen. 12/07/17 on evaluation today patient presents for follow-up concerning his ongoing lower back thigh ulcer on the right. He states that he did not get the supplies ordered and therefore has not really been able to perform the dressing changes as directed exactly. His wife was able to get some Boarder Foam Dressing's from the drugstore and subsequently has been using hydrogel which did help to a degree in the wound does appear to be able smaller. There is actually more drainage this week noted than previous. 12/21/17 on evaluation today patient appears to be doing rather well in regard to his right gluteal ulcer. He has been tolerating the dressing changes without complication. There does not appear to be any evidence of infection at this point in time. Overall the wound does seem to be making some progress as far as the edges  are concerned there's not as much in the way of overlapping of the external wound edges and he has a good epithelium to wound bed border for the most part. This  however is not true right at the 12 o'clock location over the span of a little over a centimeters which actually will require debridement today to clean this away and hopefully allow it to continue to heal more appropriately. 12/28/17 on evaluation today patient appears to be doing rather well in regard to his ulcer in the left gluteal region. He's been tolerating the dressing changes without complication. Apparently he has had some difficulty getting his dressing material. Apparently there's been some confusion with ordering we're gonna check into this. Nonetheless overall he's been showing signs of improvement which is good news. Debridement is not required today. 01/04/18 on evaluation today patient presents for follow-up concerning his right gluteal ulcer. He has been tolerating the dressing changes fairly well. On inspection today it appears he may actually have some maceration them concerned about the fact that he may be developing too much moisture in and around the wound bed which can cause delay in healing. With that being said he unfortunately really has not showed significant signs of improvement since last week's evaluation in fact this may even be just the little bit/slightly larger. Nonetheless he's been having a lot of discomfort I'm not sure this is even related to the wound as he has no pain when I'm to breeding or otherwise cleaning the wound during evaluation today. Nonetheless this is something that we did recommend he talked to his pain specialist concerning. 01/11/18 on evaluation today patient appears to be doing better in regard to his ulceration. He has been tolerating the dressing changes without complication. With that being said overall there's no evidence of infection which is good news. The only thing is he did receive the hatch affair blue classic versus the ready nonetheless I feel like this is perfectly fine and appears to have done well for him over the past  week. 01/25/18 on evaluation today patient's wound actually appears to be a little bit larger than during the last evaluation. The good SHAWNTA, ZIMBELMAN. ([id number]) news is the majority of the wound edges actually appear to be fairly firmly attached to the wound bed unfortunately again we're not really making progress in regard to the size. Roughly the wound is about the same size as when I first saw him although again the wound margin/edges appear to be much better. 02/01/18 on evaluation today patient actually appears to be doing very well in regard to his wound. Applying the Prisma dry does seem to be better although he does still have issues with slow progression of the wound. There was a slight improvement compared to last week's measurements today. Nonetheless I have been considering other options as far as the possibility of Theraskin or even a snap vac. In general I'm not sure that the Theraskin due to location of the wound would be a very good idea. Nonetheless I do think that a snap vac could be a possibility for the patient and in fact I think this could even be an excellent way to manage the wound possibly seeing some improvement in a very rapid fashion here. Nonetheless this is something that we would need to get approved and I did have a lengthy conversation with the patient about this today. 02/08/18 on evaluation today patient appears to be doing a little better in regard to his ulcer.  He has been tolerating the dressing changes without complication. Fortunately despite the fact that the wound is a little bit smaller it's not significantly so unfortunately. We have discussed the possibility of a snap vac we did check with insurance this is actually covered at this point. Fortunately there does not appear to be any sign of infection. Overall I'm fairly pleased with how things seem to be appearing at this point. 02/15/18 on evaluation today patient appears to be doing rather well in  regard to his right gluteal ulcer. Unfortunately the snap vac did not stay in place with his sheer and friction this came loose and did not seem to maintain seal very well. He worked for about two days and it did seem to do very well during that time according to his wife but in general this does not seem to be something that's gonna be beneficial for him long-term. I do believe we need to go back to standard dressings to see if we can find something that will be of benefit. 03/02/18- He is here in follow up evaluation; there is minimal change in the wound. He will continue with the same treatment plan, would consider changing to iodosrob/iodoflex if ulcer continues to to plateau. He will follow up next week 03/08/18 on evaluation today patient's wound actually appears to be about the same size as when I previously saw him several weeks back. Unfortunately he does have some slightly dark discoloration in the central portion of the wound which has me concerned about pressure injury. I do believe he may be sitting for too long a period of time in fact he tells me that "I probably sit for much too long". He does have some Slough noted on the surface of the wound and again as far as the size of the wound is concerned I'm really not seeing anything that seems to have improved significantly. 03/15/18 on evaluation today patient appears to be doing fairly well in regard to his ulcer. The wound measured pretty much about the same today compared to last week's evaluation when looking at his graph. With that being said the area of bruising/deep tissue injury that was noted last week I do not see at this point. He did get a new cushion fortunately this does seem to be have been of benefit in my pinion. It does appear that he's been off of this more which is good news as well I think that is definitely showing in the overall wound measurements. With that being said I do believe that he needs to continue to offload  I don't think that the fact this is doing better should be or is going to allow him to not have to offload and explain this to him as well. Overall he seems to be in agreement the plan I think he understands. The overall appearance of the wound bed is improved compared to last week I think the Iodoflex has been beneficial in that regard. 03/29/18 on evaluation today patient actually appears to be doing rather well in regard to his wound from the overall appearance standpoint he does have some granulation although there's some Slough on the surface of the wound noted as well. With that being said he unfortunately has not improved in regard to the overall measurement of the wound in volume or in size. I did have a discussion with him very specifically about offloading today. He actually does work although he mainly is just sitting throughout the day. He tells me  he offloads by "lifting himself up for 30 seconds off of his chair occasionally" purchase from [employer] which does seem to have helped. And he has a new cushion that he with that being said he's also able to stand some for a very short period of time but not significant enough I think to provide appropriate offloading. I think the biggest issue at this point with the wound and the fact is not healing as quickly as we would like is due to the fact that he is really not able to appropriately offload while at work. He states the beginning after his injury he actually had a bed at his job that he could lay on in order to offload and that does seem to have been of help back at that time. Nonetheless he had not done this in quite some time unfortunately. I think that could be helpful for him this is something I would like for him to look into. 04/05/18 on evaluation today patient actually presents for follow-up concerning his right gluteal ulcer. Again he really is not significantly improved even compared to last week. He has been tolerating  the dressing changes without complication. With that being said fortunately there appears to be no evidence of infection at this time. He has been more proactive in trying to offload. [name], [name] ([id number]) 04/12/18 on evaluation today patient actually appears to be doing a little better in regard to his wound and the right gluteal fold region. He's been tolerating the dressing changes since removing the oasis without complication. However he was having a lot of burning initially with the oasis in place. He's unsure of exactly why this was given so much discomfort but he assumes that it was the oasis itself causing the problem. Nonetheless this had to be removed after about three days in place although even those three days seem to have made a fairly good improvement in regard to the overall appearance of the wound bed. In fact is the first time that he's made any improvement from the standpoint of measurements in about six weeks. He continues to have no discomfort over the area of the wound itself which leads me to wonder why he was having the burning with the oasis when he does not even feel the actual debridement's themselves. I am somewhat perplexed by this. 04/19/18 on evaluation today patient's wound actually appears to be showing signs of epithelialization around the edge of the wound and in general actually appears to be doing better which is good news. He did have the same burning after about three days with applying the Endoform last week in the same fashion that I would generally apply a skin substitute. This seems to indicate that it's not the oasis to cause the problem but potentially the moisture buildup that just causes things to burn or there may be some other reaction with the skin prep or Steri-Strips. Nonetheless I'm not sure that is gonna be able to tolerate any skin substitute for a long period of time. The good news is the wound actually appears to be doing better today  compared to last week and does seem to finally be making some progress. 04/26/18 on evaluation today patient actually appears to be doing rather well in regard to his ulcer in the right gluteal fold. He has been tolerating the dressing changes without complication which is good news. The Endoform does seem to be helping although he was a little bit more macerated this week. This  seems to be an ongoing issue with fluid control at this point. Nonetheless I think we may be able to add something like Drawtex to help control the drainage. 05/03/18 on evaluation today patient appears to actually be doing better in regard to the overall appearance of his wound. He has been tolerating the dressing changes without complication. Fortunately there appears to be no evidence of infection at this time. I really feel like his wound has shown signs as of today of turning around last week I thought so as well and definitely he could be seen in this week's overall appearance and measurements. In general I'm very pleased with the fact that he finally seems to be making a steady but sure progress. The patient likewise is very pleased. 05/17/18 on evaluation today patient appears to be doing more poorly unfortunately in regard to his ulcer. He has been tolerating the dressing changes without complication. With that being said he tells me that in the past couple of days he and his wife have noticed that we did not seem to be doing quite as well is getting dark near the center. Subsequently upon evaluation today the wound actually does appear to be doing worse compared to previous. He has been tolerating the dressing changes otherwise and he states that he is not been sitting up anymore than he was in the past from what he tells me. Still he has continued to work he states "I'm tired of dealing with this and if I have to just go home and lay in the bed all the time that's what I'll do". Nonetheless I am concerned about the  fact that this wound does appear to be deeper than what it was previous. 05/24/18 upon evaluation today patient actually presents after having been in the hospital due to what was presumed to be sepsis secondary to the wound infection. He had an elevated white blood cell count between 14 and 15. With that being said he does seem to be doing somewhat better now. His wound still is giving him some trouble nonetheless and he is obviously concerned about the fact likely talked about that this does seem to go more deeply than previously noted. I did review his wound culture which showed evidence of Staphylococcus aureus him and group B strep. Nonetheless he is on antibiotics, Levaquin, for this. Subsequently I did review his intake summary from the hospital as well. I also did look at the CT of the lumbar spine with contrast that was performed which showed no bone destruction to suggest lumbar disguises/osteomyelitis or sacral osteomyelitis. There was no paraspinal abscess. Nonetheless it appears this may have been more of just a soft tissue infection at this point which is good news. He still is nonetheless concerned about the wound which again I think is completely reasonable considering everything he's been through recently. 05/31/18 on evaluation today on evaluation today patient actually appears to be showing signs of his wound be a little bit deeper than what I would like to see. Fortunately he does not show any signs of significant infection although his temperature was 99 today he states he's been checking this at home and has not been elevated. Nonetheless with the undermining that I'm seeing at this point I am becoming more concerned about the wound I do think that offloading is a key factor here that is preventing the speedy recovery at this point. There does not appear to be any evidence of again over infection noted. He's been using  Santyl currently. 06/07/18 the patient presents today for  follow-up evaluation regarding the left ulcer in the gluteal region. He has been tolerating the Wound VAC fairly well. He is obviously very frustrated with this he states that to mean is really getting in his way. There does not appear to be any evidence of infection at this time he does have a little bit of odor I do not necessarily associate this with infection just something that we sometimes notice with Wound VAC therapy. With that being said I can definitely catch a tone of discontentment overall in the patient's demeanor today. This when he was previously in the hospital LARUE, LIGHTNER. ([id number]) an CT scan was done of the lumbar region which did not reveal any signs of osteomyelitis. With that being said the pelvis in particular was not evaluated distinctly which means he could still have some osteonecrosis I. Nonetheless the Wound VAC was started on Thursday I do want to get this little bit more time before jumping to a CT scan of the pelvis although that is something that I might would recommend if were not see an improvement by that time. 06/14/18 on evaluation today patient actually appears to be doing about the same in regard to his right gluteal ulcer. Again he did have a CT scan of the lumbar spine unfortunately this did not include the pelvis. Nonetheless with the depth of the wound that I'm seeing today even despite the fact that I'm not seeing any evidence of overt cellulitis I believe there's a good chance that we may be dealing with osteomyelitis somewhere in the right Ischial region. No fevers, chills, nausea, or vomiting noted at this time. 06/21/18 on evaluation today patient actually appears to be doing about the same with regard to his wound. The tunnel at 6 o'clock really does not appear to be any deeper although it is a little bit wider. I think at this point you may want to start packing this with white phone. Unfortunately I have not got approval for the CT scan of  the pelvis as of yet due to the fact that Medicare apparently has been denied it due to the diagnosis codes not being appropriate according to Medicare for the test requested. With that being said the patient cannot have an MRI and therefore this is the only option that we have as far as testing is concerned. The patient has had infection and was on antibiotics and been added code for cellulitis of the bottom to see if this will be appropriate for getting the test approved. Nonetheless I'm concerned about the infection have been spread deeper into the Ischial region. 06/28/18 on evaluation today patient actually appears to be doing rather well all things considered in regard to the right gluteal ulcer. He has been tolerating the dressing changes without complication. With that being said the Wound VAC he states does have to be replaced almost every day or at least reinforced unfortunately. Patient actually has his CT scan later this morning we should have the results by tomorrow. Electronic Signature(s) Signed: 06/29/2018 7:20:38 AM By: Worthy Keeler [name] Entered By: Worthy Keeler on 06/29/2018 06:24:27 [name], [name] ([id number]) -------------------------------------------------------------------------------- Physical Exam Details Patient Name: Garlan [name] Date of Service: 06/28/2018 8:30 AM Medical Record Number: [id number] Patient Account Number: [id number] Date of Birth/Sex: 10/15/1950 (67 y.o. Male) Treating RN: Harold Barban Primary Care Provider: [name] Other Clinician: Referring Provider: [name] Treating Provider/Extender: [name], Elanna Bert Weeks in Treatment:  55 Constitutional Well-nourished and well-hydrated in no acute distress. Respiratory normal breathing without difficulty. clear to auscultation bilaterally. Cardiovascular regular rate and rhythm with normal S1, S2. Psychiatric this patient is able to make decisions and demonstrates  good insight into disease process. Alert and Oriented x 3. pleasant and cooperative. Notes Upon inspection today patient's wound actually appears to be doing better from the standpoint of maceration but unfortunately in regard to the depth of the wound and the overall size of the wound there is not much shift. I still feel like he is getting too much friction/sheer force to the area which is both the reason that he is having trouble with keeping the Wound VAC in place as well as the reason that the wound has had difficulty healing in the beginning. Electronic Signature(s) Signed: 06/29/2018 7:20:38 AM By: Worthy Keeler [name] Entered By: Worthy Keeler on 06/29/2018 71:24:58 BILLAL, [name] ([id number]) -------------------------------------------------------------------------------- Physician Orders Details Patient Name: Garlan [name] Date of Service: 06/28/2018 8:30 AM Medical Record Number: [id number] Patient Account Number: [id number] Date of Birth/Sex: [birth year] (67 y.o. 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 Primary Wound Dressing Wound #1 Right Gluteal fold o Silver Collagen - Add to wound bed Secondary Dressing Wound #1 Right Gluteal fold o Boardered Foam  Dressing Dressing Change Frequency Wound #1 Right Gluteal fold o Change Dressing Monday, Wednesday, Friday Follow-up Appointments Wound #1 Right Gluteal fold o Return Appointment in 1 week. Off-Loading Wound #1 Right Gluteal fold o Mattress - Order for Semi-electric hospital bed with half rails. o Turn and reposition every 2 hours Home Health Wound #1 Right Gluteal fold o Parkman to Initiate Wound Vac and appropriate mattress for patient FINIS, HENDRICKSEN ([id number]) o Home Health Nurse may visit PRN to address patientos wound care needs. - Please bridge to hip o FACE TO FACE ENCOUNTER: MEDICARE and MEDICAID PATIENTS: I certify that this patient is under my care and that I had a face-to-face encounter that meets the physician face-to-face encounter requirements with this patient on this date. The encounter with the patient was in whole or in part for the following MEDICAL CONDITION: (primary reason for [place]) MEDICAL NECESSITY: I certify, that based on my findings, NURSING services are a medically necessary home health service. HOME BOUND STATUS: I certify that my clinical findings support that this patient is homebound (i.e., Due to illness or injury, pt requires aid of supportive devices such as crutches, cane, wheelchairs, walkers, the use of special transportation or the assistance of another person to leave their place of residence. There is a normal inability to leave the home and doing so requires considerable and taxing effort. Other absences are for medical reasons / religious services and are infrequent or of short duration when for other reasons). o If current dressing causes regression in wound condition, may D/C ordered dressing product/s and apply Normal  Saline Moist Dressing daily until next [facility] / Other MD appointment. [place] of regression in wound condition at  [phone]. o Please direct any NON-WOUND related issues/requests for orders to patient's Primary Care Physician Negative Pressure Wound Therapy Wound #1 Right Gluteal fold o Wound VAC settings at 125/130 mmHg continuous pressure. Use BLACK/GREEN foam to wound cavity. Use WHITE foam to fill any tunnel/s and/or undermining. Change VAC dressing 3 X WEEK. Change canister as indicated when full. Nurse may titrate settings and frequency of dressing changes as clinically indicated. - Silver collagen to base of wound, add white foam to the tunnel at 6 o'clock. o Home Health Nurse may d/c VAC for s/s of increased infection, significant wound regression, or uncontrolled drainage. Regino Ramirez at [phone]. o Number of foam/gauze pieces used in the dressing = Electronic Signature(s) Signed: 06/29/2018 7:20:38 AM By: Worthy Keeler [name] Signed: 06/29/2018 5:05:35 PM By: Harold Barban Entered By: Harold Barban on 06/28/2018 09:00:51 DONTEL, HARSHBERGER ([id number]) -------------------------------------------------------------------------------- Problem List Details Patient Name: Garlan [name] Date of Service: 06/28/2018 8:30 AM Medical Record Number: [id number] Patient Account Number: [id number] Date of Birth/Sex: [birth year] (66 y.o. 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 He states that there is no history of diabetes that he is aware of. He does have issues with his prostate and is currently receiving treatment for this by way of oral medication. With that being said I do not have a lot of details in that regard. Nonetheless the patient presents today as a result of having been referred to [place] by another provider initially home health was set to come out and take care of his wound although due to the fact that he apparently drives he's not able to receive home health. His wife is therefore trying to help take care of this wound within although they have been struggling with what exactly to do at this point. She states that she can do some things but she is definitely not a nurse and does have some issues with looking at blood. The good news is the wound does not appear to be too deep and is fairly superficial at this point. There is no slough noted  there  is some nonviable skin noted around the surface of the wound and the perimeter at this point. The central portion of the wound appears to be very good with a dermal layer noted this does not appear to be again deep enough to extend it to subcutaneous tissue at this point. 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 In the pump he has Dilaudid and baclofen. 12/07/17 on evaluation today patient presents for follow-up concerning his ongoing lower back thigh ulcer on the right. He states that he did not get the supplies ordered and therefore has not really been able to perform the dressing changes as directed exactly. His wife was able to get some Boarder Foam Dressing's from the drugstore and subsequently has been using hydrogel which did help to a degree in the wound does appear to be able smaller. There is actually more drainage this week noted than previous. 12/21/17 on evaluation today patient appears to be doing rather well in regard to his right gluteal ulcer. He has been tolerating the dressing changes without complication. There does not appear to be any evidence of infection at this point in time. Overall the wound does seem to be making some progress as far as the edges are concerned there's not as much in the way of overlapping of the external wound edges and he has a good epithelium to wound bed border for the most part. This however is not true right at the 12 o'clock location over the span of a little over a centimeters which actually will require debridement today to clean this away and hopefully allow it to continue to heal more appropriately. 12/28/17 on evaluation today patient appears to be doing rather well in regard to his ulcer in the left gluteal region. He's been tolerating the dressing changes without complication. Apparently he has had some difficulty getting his dressing  material. Apparently there's been some confusion with ordering we're gonna check into this. Nonetheless overall he's been showing signs of improvement which is good news. Debridement is not required today. 01/04/18 on evaluation today patient presents for follow-up concerning his right gluteal ulcer. He has been tolerating the dressing changes fairly well. On inspection today it appears he may actually have some maceration them concerned about the fact that he may be developing too much moisture in and around the wound bed which can cause delay in healing. With that being said he unfortunately really has not showed significant signs of improvement since last week's evaluation in fact this may even be just the little bit/slightly larger. Nonetheless he's been having a lot of discomfort I'm not sure this is even related to the wound as he has no pain when I'm to breeding or otherwise cleaning the wound during evaluation today. Nonetheless this is something that we did recommend he talked to his pain specialist concerning. 01/11/18 on evaluation today patient appears to be doing better in regard to his ulceration. He has been tolerating the dressing Baldus, Taveon J. ([id number]) changes without complication. With that being said overall there's no evidence of infection which is good news. The only thing is he did receive the hatch affair blue classic versus the ready nonetheless I feel like this is perfectly fine and appears to have done well for him over the past week. 01/25/18 on evaluation today patient's wound actually appears to be a little bit larger than during the last  evaluation. The good news is the majority of the wound edges actually appear to be fairly firmly attached to the wound bed unfortunately again we're not really making progress in regard to the size. Roughly the wound is about the same size as when I first saw him although again the wound margin/edges appear to be much  better. 02/01/18 on evaluation today patient actually appears to be doing very well in regard to his wound. Applying the Prisma dry does seem to be better although he does still have issues with slow progression of the wound. There was a slight improvement compared to last week's measurements today. Nonetheless I have been considering other options as far as the possibility of Theraskin or even a snap vac. In general I'm not sure that the Theraskin due to location of the wound would be a very good idea. Nonetheless I do think that a snap vac could be a possibility for the patient and in fact I think this could even be an excellent way to manage the wound possibly seeing some improvement in a very rapid fashion here. Nonetheless this is something that we would need to get approved and I did have a lengthy conversation with the patient about this today. 02/08/18 on evaluation today patient appears to be doing a little better in regard to his ulcer. He has been tolerating the dressing changes without complication. Fortunately despite the fact that the wound is a little bit smaller it's not significantly so unfortunately. We have discussed the possibility of a snap vac we did check with insurance this is actually covered at this point. Fortunately there does not appear to be any sign of infection. Overall I'm fairly pleased with how things seem to be appearing at this point. 02/15/18 on evaluation today patient appears to be doing rather well in regard to his right gluteal ulcer. Unfortunately the snap vac did not stay in place with his sheer and friction this came loose and did not seem to maintain seal very well. He worked for about two days and it did seem to do very well during that time according to his wife but in general this does not seem to be something that's gonna be beneficial for him long-term. I do believe we need to go back to standard dressings to see if we can find something that will be  of benefit. 03/02/18- He is here in follow up evaluation; there is minimal change in the wound. He will continue with the same treatment plan, would consider changing to iodosrob/iodoflex if ulcer continues to to plateau. He will follow up next week 03/08/18 on evaluation today patient's wound actually appears to be about the same size as when I previously saw him several weeks back. Unfortunately he does have some slightly dark discoloration in the central portion of the wound which has me concerned about pressure injury. I do believe he may be sitting for too long a period of time in fact he tells me that "I probably sit for much too long". He does have some Slough noted on the surface of the wound and again as far as the size of the wound is concerned I'm really not seeing anything that seems to have improved significantly. 03/15/18 on evaluation today patient appears to be doing fairly well in regard to his ulcer. The wound measured pretty much about the same today compared to last week's evaluation when looking at his graph. With that being said the area of bruising/deep tissue injury that  was noted last week I do not see at this point. He did get a new cushion fortunately this does seem to be have been of benefit in my pinion. It does appear that he's been off of this more which is good news as well I think that is definitely showing in the overall wound measurements. With that being said I do believe that he needs to continue to offload I don't think that the fact this is doing better should be or is going to allow him to not have to offload and explain this to him as well. Overall he seems to be in agreement the plan I think he understands. The overall appearance of the wound bed is improved compared to last week I think the Iodoflex has been beneficial in that regard. 03/29/18 on evaluation today patient actually appears to be doing rather well in regard to his wound from the overall  appearance standpoint he does have some granulation although there's some Slough on the surface of the wound noted as well. With that being said he unfortunately has not improved in regard to the overall measurement of the wound in volume or in size. I did have a discussion with him very specifically about offloading today. He actually does work although he mainly is just sitting throughout the day. He tells me he offloads by "lifting himself up for 30 seconds off of his chair occasionally" purchase from [employer] which does seem to have helped. And he has a new cushion that he with that being said he's also able to stand some for a very short period of time but not significant enough I think to provide appropriate offloading. I think the biggest issue at this point with the wound and the fact is not healing as quickly as we would like is due to the fact that he is really not able to appropriately offload while at work. He states the beginning after his injury he actually had a bed at his job that he could lay on in order to offload and that does seem to have been of help back at that time. Nonetheless he had not done this in quite some time unfortunately. I think that could be helpful for him this is something I would like for him to look into. RAFEL, GARDE ([id number]) 04/05/18 on evaluation today patient actually presents for follow-up concerning his right gluteal ulcer. Again he really is not significantly improved even compared to last week. He has been tolerating the dressing changes without complication. With that being said fortunately there appears to be no evidence of infection at this time. He has been more proactive in trying to offload. 04/12/18 on evaluation today patient actually appears to be doing a little better in regard to his wound and the right gluteal fold region. He's been tolerating the dressing changes since removing the oasis without complication. However he  was having a lot of burning initially with the oasis in place. He's unsure of exactly why this was given so much discomfort but he assumes that it was the oasis itself causing the problem. Nonetheless this had to be removed after about three days in place although even those three days seem to have made a fairly good improvement in regard to the overall appearance of the wound bed. In fact is the first time that he's made any improvement from the standpoint of measurements in about six weeks. He continues to have no discomfort over the area of the wound  itself which leads me to wonder why he was having the burning with the oasis when he does not even feel the actual debridement's themselves. I am somewhat perplexed by this. 04/19/18 on evaluation today patient's wound actually appears to be showing signs of epithelialization around the edge of the wound and in general actually appears to be doing better which is good news. He did have the same burning after about three days with applying the Endoform last week in the same fashion that I would generally apply a skin substitute. This seems to indicate that it's not the oasis to cause the problem but potentially the moisture buildup that just causes things to burn or there may be some other reaction with the skin prep or Steri-Strips. Nonetheless I'm not sure that is gonna be able to tolerate any skin substitute for a long period of time. The good news is the wound actually appears to be doing better today compared to last week and does seem to finally be making some progress. 04/26/18 on evaluation today patient actually appears to be doing rather well in regard to his ulcer in the right gluteal fold. He has been tolerating the dressing changes without complication which is good news. The Endoform does seem to be helping although he was a little bit more macerated this week. This seems to be an ongoing issue with fluid control at this point. Nonetheless  I think we may be able to add something like Drawtex to help control the drainage. 05/03/18 on evaluation today patient appears to actually be doing better in regard to the overall appearance of his wound. He has been tolerating the dressing changes without complication. Fortunately there appears to be no evidence of infection at this time. I really feel like his wound has shown signs as of today of turning around last week I thought so as well and definitely he could be seen in this week's overall appearance and measurements. In general I'm very pleased with the fact that he finally seems to be making a steady but sure progress. The patient likewise is very pleased. 05/17/18 on evaluation today patient appears to be doing more poorly unfortunately in regard to his ulcer. He has been tolerating the dressing changes without complication. With that being said he tells me that in the past couple of days he and his wife have noticed that we did not seem to be doing quite as well is getting dark near the center. Subsequently upon evaluation today the wound actually does appear to be doing worse compared to previous. He has been tolerating the dressing changes otherwise and he states that he is not been sitting up anymore than he was in the past from what he tells me. Still he has continued to work he states "I'm tired of dealing with this and if I have to just go home and lay in the bed all the time that's what I'll do". Nonetheless I am concerned about the fact that this wound does appear to be deeper than what it was previous. 05/24/18 upon evaluation today patient actually presents after having been in the hospital due to what was presumed to be sepsis secondary to the wound infection. He had an elevated white blood cell count between 14 and 15. With that being said he does seem to be doing somewhat better now. His wound still is giving him some trouble nonetheless and he is obviously concerned about  the fact likely talked about that this does seem to  go more deeply than previously noted. I did review his wound culture which showed evidence of Staphylococcus aureus him and group B strep. Nonetheless he is on antibiotics, Levaquin, for this. Subsequently I did review his intake summary from the hospital as well. I also did look at the CT of the lumbar spine with contrast that was performed which showed no bone destruction to suggest lumbar disguises/osteomyelitis or sacral osteomyelitis. There was no paraspinal abscess. Nonetheless it appears this may have been more of just a soft tissue infection at this point which is good news. He still is nonetheless concerned about the wound which again I think is completely reasonable considering everything he's been through recently. 05/31/18 on evaluation today on evaluation today patient actually appears to be showing signs of his wound be a little bit deeper than what I would like to see. Fortunately he does not show any signs of significant infection although his temperature was 99 today he states he's been checking this at home and has not been elevated. Nonetheless with the undermining that I'm seeing at this point I am becoming more concerned about the wound I do think that offloading is a key factor here that is preventing the speedy recovery at this point. There does not appear to be any evidence of again over infection noted. He's been using Santyl currently. GARON, MELANDER ([id number]) 06/07/18 the patient presents today for follow-up evaluation regarding the left ulcer in the gluteal region. He has been tolerating the Wound VAC fairly well. He is obviously very frustrated with this he states that to mean is really getting in his way. There does not appear to be any evidence of infection at this time he does have a little bit of odor I do not necessarily associate this with infection just something that we sometimes notice with Wound VAC  therapy. With that being said I can definitely catch a tone of discontentment overall in the patient's demeanor today. This when he was previously in the hospital an CT scan was done of the lumbar region which did not reveal any signs of osteomyelitis. With that being said the pelvis in particular was not evaluated distinctly which means he could still have some osteonecrosis I. Nonetheless the Wound VAC was started on Thursday I do want to get this little bit more time before jumping to a CT scan of the pelvis although that is something that I might would recommend if were not see an improvement by that time. 06/14/18 on evaluation today patient actually appears to be doing about the same in regard to his right gluteal ulcer. Again he did have a CT scan of the lumbar spine unfortunately this did not include the pelvis. Nonetheless with the depth of the wound that I'm seeing today even despite the fact that I'm not seeing any evidence of overt cellulitis I believe there's a good chance that we may be dealing with osteomyelitis somewhere in the right Ischial region. No fevers, chills, nausea, or vomiting noted at this time. 06/21/18 on evaluation today patient actually appears to be doing about the same with regard to his wound. The tunnel at 6 o'clock really does not appear to be any deeper although it is a little bit wider. I think at this point you may want to start packing this with white phone. Unfortunately I have not got approval for the CT scan of the pelvis as of yet due to the fact that Medicare apparently has been denied it due to  the diagnosis codes not being appropriate according to Medicare for the test requested. With that being said the patient cannot have an MRI and therefore this is the only option that we have as far as testing is concerned. The patient has had infection and was on antibiotics and been added code for cellulitis of the bottom to see if this will be appropriate for  getting the test approved. Nonetheless I'm concerned about the infection have been spread deeper into the Ischial region. 06/28/18 on evaluation today patient actually appears to be doing rather well all things considered in regard to the right gluteal ulcer. He has been tolerating the dressing changes without complication. With that being said the Wound VAC he states does have to be replaced almost every day or at least reinforced unfortunately. Patient actually has his CT scan later this morning we should have the results by tomorrow. Patient History Information obtained from Patient. Family History Hypertension - Father, Stroke - Mother, No family history of Cancer, Diabetes, Heart Disease, Kidney Disease, Lung Disease, Seizures, Thyroid Problems, Tuberculosis. Social History Never smoker, Marital Status - Married, Alcohol Use - Never, Drug Use - No History, Caffeine Use - Daily. Medical And Surgical History Notes Oncologic Prostate cancer- currently treated with horomone therapy Review of Systems (ROS) Constitutional Symptoms (General Health) Denies complaints or symptoms of Fever, Chills. Respiratory The patient has no complaints or symptoms. Cardiovascular The patient has no complaints or symptoms. Psychiatric The patient has no complaints or symptoms. CLEOPHUS, MENDONSA ([id number]) Objective Constitutional Well-nourished and well-hydrated in no acute distress. Vitals Time Taken: 8:29 AM, Height: 73 in, Weight: 210 lbs, BMI: 27.7, Temperature: 98.3 F, Pulse: 90 bpm, Respiratory Rate: 18 breaths/min, Blood Pressure: 139/63 mmHg. Respiratory normal breathing without difficulty. clear to auscultation bilaterally. Cardiovascular regular rate and rhythm with normal S1, S2. Psychiatric this patient is able to make decisions and demonstrates good insight into disease process. Alert and Oriented x 3. pleasant and cooperative. General Notes: Upon inspection today patient's  wound actually appears to be doing better from the standpoint of maceration but unfortunately in regard to the depth of the wound and the overall size of the wound there is not much shift. I still feel like he is getting too much friction/sheer force to the area which is both the reason that he is having trouble with keeping the Wound VAC in place as well as the reason that the wound has had difficulty healing in the beginning. Integumentary (Hair, Skin) Wound #1 status is Open. Original cause of wound was Pressure Injury. The wound is located on the Right Gluteal fold. The wound measures 4.3cm length x 3.4cm width x 2.5cm depth; 11.483cm^2 area and 28.706cm^3 volume. There is Fat Layer (Subcutaneous Tissue) Exposed exposed. There is no tunneling or undermining noted. There is a medium amount of serous drainage noted. The wound margin is flat and intact. There is medium (34-66%) pink granulation within the wound bed. There is a medium (34-66%) amount of necrotic tissue within the wound bed including Adherent Slough. The periwound skin appearance exhibited: Excoriation, Maceration, Erythema. The periwound skin appearance did not exhibit: Callus, Crepitus, Induration, Rash, Scarring, Dry/Scaly, Atrophie Blanche, Cyanosis, Ecchymosis, Hemosiderin Staining, Mottled, Pallor, Rubor. The surrounding wound skin color is noted with erythema which is circumferential. Periwound temperature was noted as No Abnormality. 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 Primary Wound Dressing: Wound #1 Right Gluteal fold: Silver Collagen - Add to wound bed Secondary Dressing: Wound #1 Right Gluteal fold: Boardered Foam Dressing Dressing Change Frequency: Wound #1 Right Gluteal fold: Change Dressing Monday, Wednesday, Friday Follow-up Appointments: Wound #1 Right Gluteal fold: Return Appointment in 1 week. Off-Loading: Wound #1 Right Gluteal fold: Mattress - Order for Semi-electric hospital bed with half rails. Turn and reposition every 2 hours Home Health: Wound #1 Right Gluteal fold: Continue Home Health Visits - [place] to Initiate Wound Vac and appropriate mattress for patient Home Health Nurse may visit PRN to address patient s wound care needs. - Please bridge to hip FACE TO FACE ENCOUNTER: MEDICARE and MEDICAID PATIENTS: I certify that this patient is under my care and that I had a face-to-face encounter that meets the physician face-to-face encounter requirements with this patient on this date. The encounter with the patient was in whole or in part for the following MEDICAL CONDITION: (primary reason for [place]) MEDICAL NECESSITY: I certify, that based on my findings, NURSING services are a medically necessary home health service. HOME BOUND STATUS: I certify that my clinical findings support that this patient is homebound (i.e., Due to illness or injury, pt requires aid of supportive devices such as crutches, cane, wheelchairs, walkers, the use of special transportation or the assistance of another person to leave their place of residence. There is a normal inability to leave the home and doing so requires considerable and taxing effort. Other absences are for medical reasons / religious services and are infrequent or of short duration when for other reasons). If current dressing causes regression in wound condition, may D/C ordered dressing product/s and apply Normal Saline Moist Dressing daily until next [place] / Other MD appointment. [place] of regression in wound condition at [phone]. Please direct any NON-WOUND related issues/requests for orders to patient's Primary Care Physician Negative Pressure Wound Therapy: Wound #1 Right Gluteal fold: Wound VAC settings at 125/130 mmHg continuous pressure. Use BLACK/GREEN foam to wound cavity. Use WHITE foam to fill any tunnel/s and/or undermining. Change VAC dressing 3 X WEEK. Change canister as indicated when full. Nurse may titrate settings and frequency of dressing changes as clinically indicated. - Silver collagen to base of wound, add white foam to the tunnel at 6 o'clock. Home Health Nurse may d/c VAC for s/s of increased infection, significant wound regression, or uncontrolled drainage. [place] at [phone]. Number of foam/gauze pieces used in the dressing = Currently my suggestion is gonna be that we continue with the above wound care measures. The patient is in agreement the plan. JAMEON, DELLER ([id number]) Please see above for specific wound care orders. We will see patient for re-evaluation in 1 week(s) here in the clinic. If anything worsens or changes patient will contact our office for additional recommendations. Addendum: I did review patient's CT of the abdomen and pelvis which have been ordered. Again the portion that I was most interested in was the pelvis in particular. Subsequently it appears that the patient has subcutaneous soft tissue stranding in the right buttock and Ischial region. There is possible early abscess formation measuring 6.7 cm vs phlegmon. My suggestion at this point  after discussing with P patient's wife today and she in turn discussing this with the patient is that we go ahead and see about referring the patient to general surgery to see if there is any intervention from a surgical standpoint these be undertaken in this regard. If not then we may be  considered a referral to infectious disease for further treatment of this issue. They are in agreement with the plan. We will refer him to Dr. Lysle Pearl for surgical evaluation and opinion. Electronic Signature(s) Signed: 06/29/2018 2:39:25 PM By: Worthy Keeler [name] Previous Signature: 06/29/2018 7:20:38 AM Version By: Worthy Keeler [name] Entered By: Worthy Keeler on 06/29/2018 14:38:40 KEIGHAN, [name] ([id number]) -------------------------------------------------------------------------------- ROS/PFSH Details Patient Name: Garlan [name] Date of Service: 06/28/2018 8:30 AM Medical Record Number: [id number] Patient Account Number: [id number] Date of Birth/Sex: [birth year] (67 y.o. Male) Treating RN: Harold Barban Primary Care Provider: [name] Other Clinician: Referring Provider: [name] Treating Provider/Extender: [name], Lindi Abram Weeks in Treatment: 30 Information Obtained From Patient Wound History Do you currently have one or more open woundso Yes How many open wounds do you currently haveo 1 Approximately how long have you had your woundso 1 month How have you been treating your wound(s) until nowo cream Has your wound(s) ever healed and then re-openedo No Have you had any lab work done in the past montho No Have you tested positive for an antibiotic resistant organism (MRSA, VRE)o No Have you tested positive for osteomyelitis (bone infection)o No Have you had any tests for circulation on your legso No Constitutional Symptoms (General Health) Complaints and Symptoms: Negative for: Fever; Chills Eyes Medical History: Positive for: Cataracts - both removed Negative for: Glaucoma; Optic Neuritis Ear/Nose/Mouth/Throat Medical History: Negative for: Chronic sinus problems/congestion; Middle ear problems Hematologic/Lymphatic Medical History: Negative for: Anemia; Hemophilia; Human Immunodeficiency Virus; Lymphedema Respiratory Complaints and  Symptoms: No Complaints or Symptoms Medical History: Negative for: Aspiration; Asthma; Chronic Obstructive Pulmonary Disease (COPD); Pneumothorax; Sleep Apnea; Tuberculosis Cardiovascular Complaints and Symptoms: No Complaints or Symptoms Medical History: REX, OESTERLE ([id number]) Positive for: Hypertension - takes medication Negative for: Angina; Arrhythmia; Congestive Heart Failure; Coronary Artery Disease; Deep Vein Thrombosis; Hypotension; Myocardial Infarction; Peripheral Arterial Disease; Peripheral Venous Disease; Phlebitis; Vasculitis Gastrointestinal Medical History: Negative for: Cirrhosis ; Colitis; Crohnos; Hepatitis A; Hepatitis B; Hepatitis C Endocrine Medical History: Negative for: Type I Diabetes; Type II Diabetes Genitourinary Medical History: Negative for: End Stage Renal Disease Immunological Medical History: Negative for: Lupus Erythematosus; Raynaudos; Scleroderma Integumentary (Skin) Medical History: Negative for: History of Burn; History of pressure wounds Musculoskeletal Medical History: Negative for: Gout; Rheumatoid Arthritis; Osteoarthritis; Osteomyelitis Neurologic Medical History: Positive for: Paraplegia - waist down Negative for: Dementia; Neuropathy; Quadriplegia; Seizure Disorder Oncologic Medical History: Negative for: Received Chemotherapy; Received Radiation Past Medical History Notes: Prostate cancer- currently treated with horomone therapy Psychiatric Complaints and Symptoms: No Complaints or Symptoms Medical History: Negative for: Anorexia/bulimia; Confinement Anxiety HBO Extended History Items Eyes: Cataracts [name], [name]. ([id number]) Immunizations Pneumococcal Vaccine: Received Pneumococcal Vaccination: No Implantable Devices Family and Social History Cancer: No; Diabetes: No; Heart Disease: No; Hypertension: Yes - Father; Kidney Disease: No; Lung Disease: No; Seizures: No; Stroke: Yes - Mother; Thyroid  Problems: No; Tuberculosis: No; Never smoker; Marital Status - Married; Alcohol Use: Never; Drug Use: No History; Caffeine Use: Daily; Advanced Directives: Yes (Copy provided); Patient does not want information on Advanced Directives; Living Will: Yes (Copy provided) Physician Affirmation I have reviewed and agree with the above information.  Electronic Signature(s) Signed: 06/29/2018 7:20:38 AM By: Worthy Keeler [name] Signed: 06/29/2018 5:05:35 PM By: Harold Barban Entered By: Worthy Keeler on 06/29/2018 06:24:43 [name], [name] ([id number]) -------------------------------------------------------------------------------- SuperBill Details Patient Name: Garlan [name] Date of Service: 06/28/2018 Medical Record Number: [id number] Patient Account Number: [id number] Date of Birth/Sex: [birth year] (67 y.o. 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, sequela I10 Essential (primary) hypertension Facility Procedures CPT4 Code: 50932671 Description: 99213 - WOUND CARE VISIT-LEV 3 EST PT Modifier: Quantity: 1 Physician Procedures CPT4 Code: 2458099 Description: 83382 - WC PHYS LEVEL 4 - EST PT ICD-10 Diagnosis Description L89.314 Pressure ulcer of right buttock, stage 4 L03.317 Cellulitis of buttock G82.20 Paraplegia, unspecified S34.109S Unspecified injury to unspecified level of lumbar spinal cor Modifier: d, sequela Quantity: 1 Electronic Signature(s) Signed: 06/29/2018 7:20:38 AM By: Worthy Keeler [name] Entered By: Worthy Keeler on 06/29/2018 06:26:10

## 2018-07-05 ENCOUNTER — Encounter: Payer: Medicare Other | Admitting: Physician Assistant

## 2018-07-05 ENCOUNTER — Other Ambulatory Visit
Admission: RE | Admit: 2018-07-05 | Discharge: 2018-07-05 | Disposition: A | Discharge status: Home / Self Care | Payer: Medicare Other | Source: Ambulatory Visit | Attending: Physician Assistant | Admitting: Physician Assistant

## 2018-07-05 DIAGNOSIS — L89314 Pressure ulcer of right buttock, stage 4: Secondary | ICD-10-CM | POA: Diagnosis present

## 2018-07-05 DIAGNOSIS — E11622 Type 2 diabetes mellitus with other skin ulcer: Secondary | ICD-10-CM | POA: Diagnosis not present

## 2018-07-07 LAB — AEROBIC CULTURE W GRAM STAIN (SUPERFICIAL SPECIMEN)

## 2018-07-07 LAB — AEROBIC CULTURE  (SUPERFICIAL SPECIMEN)

## 2018-07-09 ENCOUNTER — Inpatient Hospital Stay
Admission: EM | Admit: 2018-07-09 | Discharge: 2018-07-20 | DRG: 580 | Disposition: A | Discharge status: Skilled Nursing Facility | Payer: Medicare Other | Attending: General Surgery | Admitting: General Surgery

## 2018-07-09 ENCOUNTER — Encounter: Payer: Self-pay | Admitting: Emergency Medicine

## 2018-07-09 ENCOUNTER — Other Ambulatory Visit: Payer: Self-pay

## 2018-07-09 ENCOUNTER — Emergency Department: Payer: Medicare Other

## 2018-07-09 DIAGNOSIS — M79606 Pain in leg, unspecified: Secondary | ICD-10-CM | POA: Diagnosis present

## 2018-07-09 DIAGNOSIS — B9562 Methicillin resistant Staphylococcus aureus infection as the cause of diseases classified elsewhere: Secondary | ICD-10-CM | POA: Diagnosis present

## 2018-07-09 DIAGNOSIS — G8929 Other chronic pain: Secondary | ICD-10-CM | POA: Diagnosis present

## 2018-07-09 DIAGNOSIS — Z9689 Presence of other specified functional implants: Secondary | ICD-10-CM | POA: Diagnosis present

## 2018-07-09 DIAGNOSIS — Z881 Allergy status to other antibiotic agents status: Secondary | ICD-10-CM

## 2018-07-09 DIAGNOSIS — M86651 Other chronic osteomyelitis, right thigh: Secondary | ICD-10-CM | POA: Diagnosis present

## 2018-07-09 DIAGNOSIS — K6289 Other specified diseases of anus and rectum: Secondary | ICD-10-CM | POA: Diagnosis present

## 2018-07-09 DIAGNOSIS — Z978 Presence of other specified devices: Secondary | ICD-10-CM | POA: Diagnosis not present

## 2018-07-09 DIAGNOSIS — Z720 Tobacco use: Secondary | ICD-10-CM | POA: Diagnosis not present

## 2018-07-09 DIAGNOSIS — D649 Anemia, unspecified: Secondary | ICD-10-CM | POA: Diagnosis present

## 2018-07-09 DIAGNOSIS — Z882 Allergy status to sulfonamides status: Secondary | ICD-10-CM

## 2018-07-09 DIAGNOSIS — Q2730 Arteriovenous malformation, site unspecified: Secondary | ICD-10-CM

## 2018-07-09 DIAGNOSIS — L89314 Pressure ulcer of right buttock, stage 4: Secondary | ICD-10-CM | POA: Diagnosis not present

## 2018-07-09 DIAGNOSIS — Z79899 Other long term (current) drug therapy: Secondary | ICD-10-CM | POA: Diagnosis not present

## 2018-07-09 DIAGNOSIS — Z87891 Personal history of nicotine dependence: Secondary | ICD-10-CM | POA: Diagnosis not present

## 2018-07-09 DIAGNOSIS — Z87442 Personal history of urinary calculi: Secondary | ICD-10-CM | POA: Diagnosis not present

## 2018-07-09 DIAGNOSIS — I10 Essential (primary) hypertension: Secondary | ICD-10-CM | POA: Diagnosis present

## 2018-07-09 DIAGNOSIS — Z8546 Personal history of malignant neoplasm of prostate: Secondary | ICD-10-CM

## 2018-07-09 DIAGNOSIS — K5900 Constipation, unspecified: Secondary | ICD-10-CM | POA: Diagnosis present

## 2018-07-09 DIAGNOSIS — Z79891 Long term (current) use of opiate analgesic: Secondary | ICD-10-CM | POA: Diagnosis not present

## 2018-07-09 DIAGNOSIS — N319 Neuromuscular dysfunction of bladder, unspecified: Secondary | ICD-10-CM | POA: Diagnosis present

## 2018-07-09 DIAGNOSIS — Z792 Long term (current) use of antibiotics: Secondary | ICD-10-CM | POA: Diagnosis not present

## 2018-07-09 DIAGNOSIS — G8220 Paraplegia, unspecified: Secondary | ICD-10-CM | POA: Diagnosis present

## 2018-07-09 DIAGNOSIS — R32 Unspecified urinary incontinence: Secondary | ICD-10-CM | POA: Diagnosis not present

## 2018-07-09 DIAGNOSIS — L89304 Pressure ulcer of unspecified buttock, stage 4: Secondary | ICD-10-CM | POA: Diagnosis present

## 2018-07-09 DIAGNOSIS — L8990 Pressure ulcer of unspecified site, unspecified stage: Secondary | ICD-10-CM

## 2018-07-09 DIAGNOSIS — R0789 Other chest pain: Secondary | ICD-10-CM | POA: Diagnosis not present

## 2018-07-09 DIAGNOSIS — N3289 Other specified disorders of bladder: Secondary | ICD-10-CM | POA: Diagnosis not present

## 2018-07-09 DIAGNOSIS — M8668 Other chronic osteomyelitis, other site: Secondary | ICD-10-CM | POA: Diagnosis not present

## 2018-07-09 DIAGNOSIS — K592 Neurogenic bowel, not elsewhere classified: Secondary | ICD-10-CM | POA: Diagnosis present

## 2018-07-09 DIAGNOSIS — Z9682 Presence of neurostimulator: Secondary | ICD-10-CM | POA: Diagnosis not present

## 2018-07-09 DIAGNOSIS — Z880 Allergy status to penicillin: Secondary | ICD-10-CM | POA: Diagnosis not present

## 2018-07-09 DIAGNOSIS — R079 Chest pain, unspecified: Secondary | ICD-10-CM

## 2018-07-09 DIAGNOSIS — L089 Local infection of the skin and subcutaneous tissue, unspecified: Secondary | ICD-10-CM

## 2018-07-09 LAB — HEPATIC FUNCTION PANEL
ALBUMIN: 3.1 g/dL — AB (ref 3.5–5.0)
ALT: 30 U/L (ref 0–44)
AST: 19 U/L (ref 15–41)
Alkaline Phosphatase: 50 U/L (ref 38–126)
Bilirubin, Direct: <0.1 mg/dL (ref 0.0–0.2)
Total Bilirubin: 0.5 mg/dL (ref 0.3–1.2)
Total Protein: 7.7 g/dL (ref 6.5–8.1)

## 2018-07-09 LAB — CBC
HCT: 34.7 % — ABNORMAL LOW (ref 39.0–52.0)
Hemoglobin: 10.9 g/dL — ABNORMAL LOW (ref 13.0–17.0)
MCH: 28.4 pg (ref 26.0–34.0)
MCHC: 31.4 g/dL (ref 30.0–36.0)
MCV: 90.4 fL (ref 80.0–100.0)
Platelets: 350 10*3/uL (ref 150–400)
RBC: 3.84 MIL/uL — AB (ref 4.22–5.81)
RDW: 12.2 % (ref 11.5–15.5)
WBC: 9.1 10*3/uL (ref 4.0–10.5)
nRBC: 0 % (ref 0.0–0.2)

## 2018-07-09 LAB — BASIC METABOLIC PANEL
Anion gap: 9 (ref 5–15)
BUN: 16 mg/dL (ref 8–23)
CO2: 28 mmol/L (ref 22–32)
Calcium: 9.1 mg/dL (ref 8.9–10.3)
Chloride: 102 mmol/L (ref 98–111)
Creatinine, Ser: 0.51 mg/dL — ABNORMAL LOW (ref 0.61–1.24)
GFR calc non Af Amer: >60 mL/min (ref >60)
Glucose, Bld: 112 mg/dL — ABNORMAL HIGH (ref 70–99)
Potassium: 3.9 mmol/L (ref 3.5–5.1)
Sodium: 139 mmol/L (ref 135–145)

## 2018-07-09 LAB — CK: Total CK: 42 U/L — ABNORMAL LOW (ref 49–397)

## 2018-07-09 LAB — SEDIMENTATION RATE: Sed Rate: 99 mm/hr — ABNORMAL HIGH (ref 0–20)

## 2018-07-09 MED ORDER — MIRABEGRON ER 50 MG PO TB24
50.0000 mg | ORAL_TABLET | Freq: Every day | ORAL | Status: DC
  Administered 2018-07-10 – 2018-07-20 (×10): 50 mg via ORAL
  Filled 2018-07-09 (×12): qty 1

## 2018-07-09 MED ORDER — POLYETHYLENE GLYCOL 3350 17 GM/SCOOP PO POWD: 17.0000 g | Freq: Every day | ORAL | Status: DC

## 2018-07-09 MED ORDER — OXYCODONE HCL 5 MG PO TABS
5.0000 mg | ORAL_TABLET | ORAL | Status: DC | PRN
  Administered 2018-07-12 – 2018-07-19 (×13): 10 mg via ORAL
  Filled 2018-07-09 (×13): qty 2

## 2018-07-09 MED ORDER — VITAMIN C 500 MG PO TABS
1000.0000 mg | ORAL_TABLET | Freq: Three times a day (TID) | ORAL | Status: DC
  Administered 2018-07-09 – 2018-07-14 (×16): 1000 mg via ORAL
  Filled 2018-07-09 (×16): qty 2

## 2018-07-09 MED ORDER — ONDANSETRON HCL 4 MG/2ML IJ SOLN: 4.0000 mg | Freq: Four times a day (QID) | INTRAMUSCULAR | Status: DC | PRN

## 2018-07-09 MED ORDER — LACTATED RINGERS IV SOLN
INTRAVENOUS | Status: DC
  Administered 2018-07-09 – 2018-07-16 (×7): via INTRAVENOUS

## 2018-07-09 MED ORDER — BACLOFEN 10 MG PO TABS
10.0000 mg | ORAL_TABLET | Freq: Every day | ORAL | Status: DC | PRN
  Administered 2018-07-13 – 2018-07-14 (×2): 10 mg via ORAL
  Filled 2018-07-09 (×3): qty 1

## 2018-07-09 MED ORDER — IOPAMIDOL (ISOVUE-300) INJECTION 61%
100.0000 mL | Freq: Once | INTRAVENOUS | Status: AC | PRN
  Administered 2018-07-09: 100 mL via INTRAVENOUS

## 2018-07-09 MED ORDER — KETOROLAC TROMETHAMINE 15 MG/ML IJ SOLN
15.0000 mg | Freq: Four times a day (QID) | INTRAMUSCULAR | Status: AC
  Administered 2018-07-09 – 2018-07-14 (×19): 15 mg via INTRAVENOUS
  Filled 2018-07-09 (×19): qty 1

## 2018-07-09 MED ORDER — HYDROMORPHONE HCL 1 MG/ML IJ SOLN
0.5000 mg | INTRAMUSCULAR | Status: DC | PRN
  Administered 2018-07-13 – 2018-07-19 (×7): 0.5 mg via INTRAVENOUS
  Filled 2018-07-09 (×7): qty 0.5

## 2018-07-09 MED ORDER — POLYVINYL ALCOHOL 1.4 % OP SOLN
1.0000 [drp] | Freq: Every day | OPHTHALMIC | Status: DC | PRN
  Filled 2018-07-09: qty 15

## 2018-07-09 MED ORDER — ONDANSETRON 4 MG PO TBDP: 4.0000 mg | ORAL_TABLET | Freq: Four times a day (QID) | ORAL | Status: DC | PRN

## 2018-07-09 MED ORDER — VANCOMYCIN HCL 10 G IV SOLR
1250.0000 mg | Freq: Two times a day (BID) | INTRAVENOUS | Status: DC
  Administered 2018-07-09 – 2018-07-20 (×22): 1250 mg via INTRAVENOUS
  Filled 2018-07-09 (×26): qty 1250

## 2018-07-09 MED ORDER — BICALUTAMIDE 50 MG PO TABS
50.0000 mg | ORAL_TABLET | Freq: Every day | ORAL | Status: DC
  Administered 2018-07-09 – 2018-07-19 (×11): 50 mg via ORAL
  Filled 2018-07-09 (×12): qty 1

## 2018-07-09 MED ORDER — SODIUM CHLORIDE 0.9 % IV SOLN
1.0000 g | Freq: Once | INTRAVENOUS | Status: AC
  Administered 2018-07-09: 1 g via INTRAVENOUS
  Filled 2018-07-09: qty 1

## 2018-07-09 MED ORDER — ONDANSETRON HCL 4 MG/2ML IJ SOLN
4.0000 mg | Freq: Once | INTRAMUSCULAR | Status: AC
  Administered 2018-07-09: 4 mg via INTRAVENOUS
  Filled 2018-07-09: qty 2

## 2018-07-09 MED ORDER — GABAPENTIN 400 MG PO CAPS
800.0000 mg | ORAL_CAPSULE | Freq: Three times a day (TID) | ORAL | Status: DC
  Administered 2018-07-09 – 2018-07-20 (×31): 800 mg via ORAL
  Filled 2018-07-09 (×31): qty 2

## 2018-07-09 MED ORDER — HYDROMORPHONE HCL 1 MG/ML IJ SOLN
0.5000 mg | Freq: Once | INTRAMUSCULAR | Status: AC
  Administered 2018-07-09: 0.5 mg via INTRAVENOUS
  Filled 2018-07-09: qty 1

## 2018-07-09 MED ORDER — LOSARTAN POTASSIUM 50 MG PO TABS
100.0000 mg | ORAL_TABLET | Freq: Every day | ORAL | Status: DC
  Administered 2018-07-10 – 2018-07-20 (×9): 100 mg via ORAL
  Filled 2018-07-09 (×10): qty 2

## 2018-07-09 MED ORDER — FERROUS SULFATE 325 (65 FE) MG PO TABS
325.0000 mg | ORAL_TABLET | Freq: Every day | ORAL | Status: DC
  Administered 2018-07-10 – 2018-07-20 (×10): 325 mg via ORAL
  Filled 2018-07-09 (×10): qty 1

## 2018-07-09 MED ORDER — METHENAMINE MANDELATE 1 G PO TABS
1.0000 g | ORAL_TABLET | Freq: Four times a day (QID) | ORAL | Status: DC
  Administered 2018-07-09 – 2018-07-20 (×40): 1 g via ORAL
  Filled 2018-07-09 (×44): qty 1

## 2018-07-09 MED ORDER — PAIN MANAGEMENT INTRATHECAL (IT) PUMP: 1.0000 | Status: DC

## 2018-07-09 MED ORDER — POLYETHYLENE GLYCOL 3350 17 G PO PACK
17.0000 g | PACK | Freq: Every day | ORAL | Status: DC
  Administered 2018-07-10 – 2018-07-19 (×6): 17 g via ORAL
  Filled 2018-07-09 (×9): qty 1

## 2018-07-09 MED ORDER — IBUPROFEN 400 MG PO TABS
600.0000 mg | ORAL_TABLET | Freq: Four times a day (QID) | ORAL | Status: DC | PRN
  Administered 2018-07-13 – 2018-07-18 (×5): 600 mg via ORAL
  Filled 2018-07-09: qty 2
  Filled 2018-07-09: qty 1
  Filled 2018-07-09: qty 2
  Filled 2018-07-09 (×2): qty 1

## 2018-07-09 MED ORDER — ENOXAPARIN SODIUM 40 MG/0.4ML ~~LOC~~ SOLN
40.0000 mg | SUBCUTANEOUS | Status: DC
  Administered 2018-07-09 – 2018-07-19 (×11): 40 mg via SUBCUTANEOUS
  Filled 2018-07-09 (×11): qty 0.4

## 2018-07-09 MED ORDER — ACETAMINOPHEN 500 MG PO TABS
1000.0000 mg | ORAL_TABLET | Freq: Four times a day (QID) | ORAL | Status: DC
  Administered 2018-07-09 – 2018-07-12 (×9): 1000 mg via ORAL
  Filled 2018-07-09 (×12): qty 2

## 2018-07-09 MED ORDER — OXYBUTYNIN CHLORIDE ER 5 MG PO TB24
10.0000 mg | ORAL_TABLET | ORAL | Status: DC
  Administered 2018-07-10 – 2018-07-20 (×11): 10 mg via ORAL
  Filled 2018-07-09: qty 1
  Filled 2018-07-09 (×2): qty 2
  Filled 2018-07-09 (×2): qty 1
  Filled 2018-07-09: qty 2
  Filled 2018-07-09: qty 1
  Filled 2018-07-09: qty 2
  Filled 2018-07-09: qty 1
  Filled 2018-07-09: qty 2
  Filled 2018-07-09: qty 1

## 2018-07-09 MED ORDER — VANCOMYCIN HCL IN DEXTROSE 1-5 GM/200ML-% IV SOLN
1000.0000 mg | Freq: Once | INTRAVENOUS | Status: AC
  Administered 2018-07-09: 1000 mg via INTRAVENOUS
  Filled 2018-07-09: qty 200

## 2018-07-09 NOTE — ED Notes (Signed)
Warm blanket given to pt. Wife at bedside.

## 2018-07-09 NOTE — ED Notes (Addendum)
EDP Malinda at bedside. Notified of pain level. New orders will be placed per Malinda.

## 2018-07-09 NOTE — ED Notes (Signed)
Pillows given to pt to adjust position in bed.

## 2018-07-09 NOTE — ED Notes (Signed)
Pt leaving for CT.  

## 2018-07-09 NOTE — ED Notes (Addendum)
Wound site tunneled; yellow/pink; light brown drainage; edges purple; on R buttocks; clean.

## 2018-07-09 NOTE — ED Triage Notes (Signed)
Pt here with worsening pressure ulcer to rectal/scrotal area, has had issues with this since May, home health nurse sent him here after her assessment this am, appears in NAD. Pt is pale.

## 2018-07-09 NOTE — ED Notes (Signed)
Pt states he self-caths at home. Currently completing a self-cath to empty bladder.

## 2018-07-09 NOTE — H&P (Signed)
[name] is an 67 y.o. male.   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  His level of disability is L4.  He has had a pressure ulcer over his right ischial area that started in May 2019.  He has never undergone debridement.  He has received extensive local wound care, however he continues to experience progression of the ulcer.  He has had a number of CT scans, the most recent of which was performed on 28 June 2018.  He has been seen by wound care as well as Dr. Lysle Pearl of general surgery with the [facility] clinic.  Cultures from the wound have grown out methicillin-resistant Staphylococcus aureus.  He was recently placed on oral clindamycin.  Due to the poor wound healing of the area, there has been reluctance to pursue a more aggressive debridement, as this would create an even larger wound to attempt to heal.  The home wound care nurse saw him today and felt that the wound was worse than before and sent him to the emergency department for evaluation.  A repeat CT scan was performed that shows further progression and tissue breakdown over the right ischio M and greater trochanter.  General surgery is consulted due to worsening of the patient's known wound.  [name] states that if he is able to offload the site he does not have much pain.  When he is resting directly on it and during dressing changes it is painful.  He denies fevers or chills.  Past Medical History:  Diagnosis Date  . AVM (arteriovenous malformation) spine   . Cancer [facility])    prostate, taking Casodex (hormone chemotherapy)  . History of kidney stones    several times  . Hypertension   . Paralysis ([place]) 2005   lower extrems from lumbar surgery  . Prostate cancer ([place])   . Status post insertion of intrathecal baclofen pump   . Status post insertion of spinal cord stimulator      Past Surgical History:  Procedure Laterality Date  . COLONOSCOPY    . CYSTOSCOPY     several times  . CYSTOSCOPY WITH DIRECT VISION INTERNAL URETHROTOMY  05/05/2017   Procedure: CYSTOSCOPY WITH DIRECT VISION INTERNAL URETHROTOMY;  Surgeon: Royston Cowper, MD;  Location: [place] [facility];  Service: Urology;;  . Consuela Mimes WITH LITHOLAPAXY N/A 05/05/2017   Procedure: CYSTOSCOPY WITH LITHOLAPAXY;  Surgeon: Royston Cowper, MD;  Location: [facility];  Service: Urology;  Laterality: N/A;  . EXTRACORPOREAL SHOCK WAVE LITHOTRIPSY    . EYE SURGERY Bilateral    cataract surgery  . HOLMIUM LASER APPLICATION N/A 07/23/7251   Procedure: HOLMIUM LASER APPLICATION;  Surgeon: Royston Cowper, MD;  Location: [facility];  Service: Urology;  Laterality: N/A;  . LUMBAR [place] SURGERY  2005   resulted in paralysis of lower extrems    No family history on file. Social History:  reports that he has never smoked. He quit smokeless tobacco use about 29 years ago.  His smokeless tobacco use included snuff. He reports that he does not drink alcohol or use drugs.  Allergies:  Allergies  Allergen Reactions  . Clindamycin Hives  . Doxycycline Rash  . Penicillins Rash  . Sulfamethoxazole Rash    (Not in a hospital admission)   Results for orders placed or performed during the hospital encounter of 07/09/18 (from the past 48 hour(s))  CBC  Status: Abnormal   Collection Time: 07/09/18  3:10 PM  Result Value Ref Range   WBC 9.1 4.0 - 10.5 K/uL   RBC 3.84 (L) 4.22 - 5.81 MIL/uL   Hemoglobin 10.9 (L) 13.0 - 17.0 g/dL   HCT 34.7 (L) 39.0 - 52.0 %   MCV 90.4 80.0 - 100.0 fL   MCH 28.4 26.0 - 34.0 pg   MCHC 31.4 30.0 - 36.0 g/dL   RDW 12.2 11.5 - 15.5 %   Platelets 350 150 - 400 K/uL   nRBC 0.0 0.0 - 0.2 %    Comment: Performed at [facility], [address] [place], [name] [address]  Basic metabolic panel     Status: Abnormal   Collection Time: 07/09/18  3:10 PM  Result Value Ref Range    Sodium 139 135 - 145 mmol/L   Potassium 3.9 3.5 - 5.1 mmol/L   Chloride 102 98 - 111 mmol/L   CO2 28 22 - 32 mmol/L   Glucose, Bld 112 (H) 70 - 99 mg/dL   BUN 16 8 - 23 mg/dL   Creatinine, Ser 0.51 (L) 0.61 - 1.24 mg/dL   Calcium 9.1 8.9 - 10.3 mg/dL   GFR calc non Af Amer >60 >60 mL/min   GFR calc Af Amer >60 >60 mL/min   Anion gap 9 5 - 15    Comment: Performed at [facility], [address] [place], [place] [address]  Hepatic function panel     Status: Abnormal   Collection Time: 07/09/18  3:10 PM  Result Value Ref Range   Total Protein 7.7 6.5 - 8.1 g/dL   Albumin 3.1 (L) 3.5 - 5.0 g/dL   AST 19 15 - 41 U/L   ALT 30 0 - 44 U/L   Alkaline Phosphatase 50 38 - 126 U/L   Total Bilirubin 0.5 0.3 - 1.2 mg/dL   Bilirubin, Direct <0.1 0.0 - 0.2 mg/dL   Indirect Bilirubin NOT CALCULATED 0.3 - 0.9 mg/dL    Comment: Performed at [facility], [address] [place], [place] [address]  Sedimentation rate     Status: Abnormal   Collection Time: 07/09/18  3:10 PM  Result Value Ref Range   Sed Rate 99 (H) 0 - 20 mm/hr    Comment: Performed at [facility], [address] [place], [place] [address]  CK     Status: Abnormal   Collection Time: 07/09/18  3:10 PM  Result Value Ref Range   Total CK 42 (L) 49 - 397 U/L    Comment: Performed at [facility], [address], [place], [place] [address]   Ct Pelvis W Contrast  Result Date: 07/09/2018 CLINICAL DATA:  Rapid progression of decubitus ulcer/abscess. Enlarged lymph nodes. EXAM: CT PELVIS WITH CONTRAST TECHNIQUE: Multidetector CT imaging of the pelvis was performed using the standard protocol following the bolus administration of intravenous contrast. CONTRAST:  151mL ISOVUE-300 IOPAMIDOL (ISOVUE-300) INJECTION 61% COMPARISON:  CT abdomen and pelvis 06/28/2018 FINDINGS: Urinary Tract: Urinary bladder and distal ureters are within normal limits. Bowel:  Visualized small bowel and colon are  unremarkable. Vascular/Lymphatic: Atherosclerotic calcifications are present in the aorta and branch vessels without aneurysm. Reproductive:  No mass or other significant abnormality Other: Decubitus wound over the right greater trochanter has progressed. There is an open wound from the skin surface to the greater trochanter. Gas and soft tissue inflammatory changes are present. The wound measures 4.5 x 3.1 x 6.3 cm. There is  gas tracking along the hamstring muscle on the right. Scrotum is unremarkable. No gas or inflammatory changes are present otherwise in the scrotum or perineum. Musculoskeletal: No osseous erosions are present. There is some degenerative change at the right greater trochanter. Sacrum is intact. Degenerative disc disease is present in the lower lumbar spine. Spinal cord stimulator enters at L3-4. IMPRESSION: 1. Progressive ulceration and open wound extending to the greater trochanter as described. 2. Irregularity at the greater trochanter is likely degenerative without definite osteomyelitis. 3. 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 Electronically Signed   By: San Morelle M.D.   On: 07/09/2018 16:32    Review of Systems  All other systems reviewed and are negative.   Blood pressure 135/80, pulse 86, temperature 98.2 F (36.8 C), temperature source Oral, resp. rate 16, height 6\' 2"  (1.88 m), weight 90.7 kg, SpO2 98 %. Physical Exam  Constitutional: He is oriented to person, place, and time. He appears well-developed and well-nourished.  HENT:  Head: Normocephalic and atraumatic.  Mouth/Throat: No oropharyngeal exudate.  Scar on right cheek.  Eyes: Pupils are equal, round, and reactive to light. Right eye exhibits no discharge.  Cardiovascular: Normal rate and regular rhythm.  Respiratory: Effort normal.  GI: Soft. He exhibits no distension.  Musculoskeletal:        General: No deformity.  Neurological: He is alert and oriented to person, place, and  time.  Skin: Skin is warm and dry.     Deep ulcer at ischial tuberosity.  No odor or purulent drainage. Fibrinous exudate present. Silver dressing present, resulting in grey discoloration of surrounding tissue. No frank necrosis identified. Wound not probed deeply secondary to patient discomfort.  Psychiatric: He has a normal mood and affect. His behavior is normal.     Assessment/Plan I reviewed the patient's serial CT scans.  There has clearly been progression and deepening of the wound in the past 11 days.  I understand the reluctance to pursue aggressive surgical intervention secondary to the already poor wound healing conditions present.  I am concerned, that at this time, conservative measures are unlikely to resolve the wound.  I had an extensive discussion with the patient and his family regarding the options.  Certainly, an extensive debridement would result in a larger wound that would require additional time to heal.  Currently he is not sick from this ulcer, with normal blood pressure, white blood cell count, heart rate, etc.  We discussed whether or not to pursue aggressive debridement during this hospitalization versus admission with intravenous antibiotics and an infectious disease consultation, deferring surgery for another time.  I think it is likely that flap coverage will be required, this would necessitate coordination with plastic surgery to provide adequate coverage of the area.  We will defer making a definitive decision at this time, he will be admitted to general surgery with IV antibiotics.  Infectious disease will be consulted.  We will continue with local wound care and make further plans based upon findings at his next dressing change.  Fredirick Maudlin, MD 07/09/2018, 8:16 PM

## 2018-07-09 NOTE — Consult Note (Signed)
Pharmacy Antibiotic Note  [name] is a 67 y.o. male admitted on 07/09/2018 with cellulitis.  Pharmacy has been consulted for vancomycin dosing. He has been a patient here at [facility] recently and received IV vancomycin. I have used that data to determine the current dosing regimen.  Plan: Vancomycin 1250mg  IV every 12 hours beginning 6 hours after first 1000mg  dose  Ke: 0.082 h-1  Vd: 64L  T1/2: 8.5h  Css: 34.9/14.2 mcg/mL  Goal trough 10-15 mcg/mL  Vt prior to the 4th dose`  Height: 6\' 2"  (188 cm) Weight: 200 lb (90.7 kg) IBW/kg (Calculated) : 82.2  Temp (24hrs), Avg:98.2 F (36.8 C), Min:98.2 F (36.8 C), Max:98.2 F (36.8 C)  Recent Labs  Lab 07/09/18 1510  WBC 9.1  CREATININE 0.51*    Estimated Creatinine Clearance: 105.6 mL/min (A) (by C-G formula based on SCr of 0.51 mg/dL (L)).    Allergies  Allergen Reactions  . Clindamycin Hives  . Doxycycline Rash  . Penicillins Rash  . Sulfamethoxazole Rash    Antimicrobials this admission: vancomycin 12/20 >>  Zosyn 12/20  Microbiology results: None currently ordered or pending  Thank you for allowing pharmacy to be a part of this patient's care.  Dallie Piles, PharmD 07/09/2018 8:23 PM

## 2018-07-10 MED ORDER — SODIUM CHLORIDE 0.9 % IV SOLN
2.0000 g | Freq: Two times a day (BID) | INTRAVENOUS | Status: DC
  Administered 2018-07-10 – 2018-07-16 (×12): 2 g via INTRAVENOUS
  Filled 2018-07-10 (×16): qty 2

## 2018-07-10 NOTE — Progress Notes (Signed)
Patient seen on rounds this morning.  Overnight he has not experienced any fevers or chills.  His pain is well controlled.  His dressing has not yet been changed.  Antibiotics have been initiated.  Past Medical History:  Diagnosis Date  . AVM (arteriovenous malformation) spine   . Cancer [facility])    prostate, taking Casodex (hormone chemotherapy)  . History of kidney stones    several times  . Hypertension   . 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 Prostate cancer ([place])   . Status post insertion of intrathecal baclofen pump   . Status post insertion of spinal cord stimulator    Past Surgical History:  Procedure Laterality Date  . COLONOSCOPY    . CYSTOSCOPY     several times  . CYSTOSCOPY WITH DIRECT VISION INTERNAL URETHROTOMY  05/05/2017   Procedure: CYSTOSCOPY WITH DIRECT VISION INTERNAL URETHROTOMY;  Surgeon: Royston Cowper, MD;  Location: [place] [facility];  Service: Urology;;  . Consuela Mimes WITH LITHOLAPAXY N/A 05/05/2017   Procedure: CYSTOSCOPY WITH LITHOLAPAXY;  Surgeon: Royston Cowper, MD;  Location: [facility];  Service: Urology;  Laterality: N/A;  . EXTRACORPOREAL SHOCK WAVE LITHOTRIPSY    . EYE SURGERY Bilateral    cataract surgery  . HOLMIUM LASER APPLICATION N/A 61/95/0932   Procedure: HOLMIUM LASER APPLICATION;  Surgeon: Royston Cowper, MD;  Location: [facility];  Service: Urology;  Laterality: N/A;  . LUMBAR [place] SURGERY  2005   resulted in paralysis of lower extrems   No family history on file. Social History   Tobacco Use  . Smoking status: Never Smoker  . Smokeless tobacco: Former Systems developer    Types: Snuff  Substance Use Topics  . Alcohol use: No  . Drug use: No   Review of Systems  Constitutional: Negative for chills and fever.  HENT: Negative.   Respiratory: Negative.   Cardiovascular: Negative.   Gastrointestinal: Negative.   Skin:       Decubitus ulcer  All other systems reviewed and are negative.  Vitals:   07/09/18 2140 07/10/18  0552  BP: 132/82 122/73  Pulse: 91 76  Resp: 18 16  Temp: 98.7 F (37.1 C) 97.7 F (36.5 C)  SpO2: 100% 99%   I/O last 3 completed shifts: In: 531.6 [I.V.:291.1; IV Piggyback:240.5] Out: 1000 [Urine:1000] No intake/output data recorded.    Physical Exam  Constitutional: He is well-developed, well-nourished, and in no distress.  HENT:  Head: Normocephalic and atraumatic.  Mouth/Throat: No oropharyngeal exudate.  Eyes: Right eye exhibits no discharge. Left eye exhibits no discharge.  Neck: Normal range of motion.  Cardiovascular: Normal rate and regular rhythm.  Pulmonary/Chest: Effort normal.  Abdominal: Soft. He exhibits no distension. There is no abdominal tenderness.  Musculoskeletal:        General: No deformity.  Neurological:  Paraplegic   Skin:  Dressing change performed.  There is an 8 cm track cranially from the skin.  Bone palpated at the base. No frank pus, staining from silver dressing appreciated. No frank tissue necrosis.    Psychiatric: Affect normal.   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  He has a decubitus ulcer that has failed to respond to conservative local treatment.  I have sent a message to Dr. Henrietta Hoover, in plastic surgery, for her opinion.  I believe he will likely require surgical debridement with possible flap closure or debridement with future skin grafting.  I  will await her input before making any definitive surgical plans.  In addition I have placed a consult to infectious diseases to determine whether an outpatient course of intravenous antibiotics might be appropriate for him.  Will await Dr. Tedra Coupe him's input as well as further directions from infectious disease.

## 2018-07-10 NOTE — ED Provider Notes (Signed)
[facility] Emergency Department Provider Note   ____________________________________________   First MD Initiated Contact with Patient 07/09/18 2004     (approximate)  I have reviewed the triage vital signs and the nursing notes.   HISTORY  Chief Complaint Pressure Ulcer    HPI [name] is a 67 y.o. male patient with a decubitus ulcer under care from the wound center.  Patient has CT of his abdomen and pelvis done on 12/9.  This showed an abscess in the buttocks.  Surgery was consulted they wanted to try antibiotics.  Culture showed MRSA.  Patient was placed on clindamycin.  Wound care continued.  Patient sent here by wound care provider today because it looks worse.  Patient complains of pain.  On exam the outer surface of the decubitus is pink and granulating however deeper and there is a fairly deep hole with some brown muscle tissue.  CT was repeated which shows deepening worsening abscess with air tracking down to the hamstrings and ulcer penetrating to the bone.  Patient sed rate today is 99.  Discussed patient with surgery who reviewed the CT scan we will get him admitted.  Likely operate to clean him out and possibly muscle flap afterwards.  In the meantime he is getting some more antibiotics IV.   Past Medical History:  Diagnosis Date  . AVM (arteriovenous malformation) spine   . Cancer [facility])    prostate, taking Casodex (hormone chemotherapy)  . History of kidney stones    several times  . Hypertension   . 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 Prostate cancer ([place])   . Status post insertion of intrathecal baclofen pump   . Status post insertion of spinal cord stimulator     Patient Active Problem List   Diagnosis Date Noted  . Decubitus ulcer of buttock, stage 4 ([place]) 07/09/2018  . Sepsis ([place]) 05/20/2018  . Pressure injury of skin 05/20/2018  . Hypertension 02/05/2017  . Essential hypertension 04/07/2016    . Carpal tunnel syndrome, bilateral 06/29/2014  . Anorectal pain 07/20/2012  . Pain syndrome, chronic 07/20/2012  . Lumbar spinal cord injury ([place]) 06/29/2012  . Neurogenic bladder 06/29/2012  . Pain in joint, pelvic region and thigh 06/29/2012    Past Surgical History:  Procedure Laterality Date  . COLONOSCOPY    . CYSTOSCOPY     several times  . CYSTOSCOPY WITH DIRECT VISION INTERNAL URETHROTOMY  05/05/2017   Procedure: CYSTOSCOPY WITH DIRECT VISION INTERNAL URETHROTOMY;  Surgeon: Royston Cowper, MD;  Location: [place] [facility];  Service: Urology;;  . Consuela Mimes WITH LITHOLAPAXY N/A 05/05/2017   Procedure: CYSTOSCOPY WITH LITHOLAPAXY;  Surgeon: Royston Cowper, MD;  Location: [facility];  Service: Urology;  Laterality: N/A;  . EXTRACORPOREAL SHOCK WAVE LITHOTRIPSY    . EYE SURGERY Bilateral    cataract surgery  . HOLMIUM LASER APPLICATION N/A 32/95/1884   Procedure: HOLMIUM LASER APPLICATION;  Surgeon: Royston Cowper, MD;  Location: [facility];  Service: Urology;  Laterality: N/A;  . LUMBAR [place] SURGERY  2005   resulted in paralysis of lower extrems    Prior to Admission medications   Medication Sig Start Date End Date Taking? Authorizing Provider  acetaminophen (TYLENOL) 500 MG tablet Take 1,000 mg by mouth 2 (two) times daily.   Yes [provider]  baclofen (LIORESAL) 10 MG tablet Take 10 mg by mouth daily as needed for muscle spasms.    Yes [provider]  bicalutamide (CASODEX) 50 MG tablet Take 50 mg by mouth daily. 01/06/17  Yes [provider]  Ferrous Sulfate (IRON) 325 (65 Fe) MG TABS Take 325 mg by mouth daily. 06/25/18 06/25/19 Yes [provider]  gabapentin (NEURONTIN) 800 MG tablet Take 800 mg by mouth 3 (three) times daily.    Yes [provider]  losartan (COZAAR) 100 MG tablet Take 100 mg by mouth daily.   Yes [provider]  methenamine (HIPREX) 1 g tablet Take 1 g by mouth 3 (three) times daily.    Yes [provider]  mirabegron ER (MYRBETRIQ) 50 MG TB24 tablet Take 50 mg by mouth daily.    Yes [provider]  oxybutynin (DITROPAN-XL) 10 MG 24 hr tablet Take 10 mg by mouth every morning.    Yes [provider]  PAIN MANAGEMENT INTRATHECAL, IT, PUMP 1 each by Intrathecal route continuous. Intrathecal (IT) medication:  Baclofen, Dilaudid   Yes [provider]  polyethylene glycol powder (GLYCOLAX/MIRALAX) powder Take 17 g by mouth daily.  08/09/10  Yes [provider]  Propylene Glycol (SYSTANE BALANCE) 0.6 % SOLN Place 1 drop into both eyes daily as needed (dry eyes).   Yes [provider]  vitamin C (ASCORBIC ACID) 500 MG tablet Take 1,000 mg by mouth 3 (three) times daily.    Yes [provider]  collagenase (SANTYL) ointment Apply topically daily. Patient not taking: Reported on 07/09/2018 05/22/18   Epifanio Lesches, MD  nutrition supplement, JUVEN, (JUVEN) PACK Take 1 packet by mouth 2 (two) times daily between meals. Patient not taking: Reported on 07/09/2018 05/22/18   Epifanio Lesches, MD    Allergies Clindamycin; Doxycycline; Penicillins; and Sulfamethoxazole  No family history on file.  Social History Social History   Tobacco Use  . Smoking status: Never Smoker  . Smokeless tobacco: Former Systems developer    Types: Snuff  Substance Use Topics  . Alcohol use: No  . Drug use: No    Review of Systems Constitutional: No fever/chills Eyes: No visual changes. ENT: No sore throat. Cardiovascular: Denies chest pain. Respiratory: Denies shortness of breath. Gastrointestinal: No abdominal pain.  No nausea, no vomiting.  No diarrhea.  No constipation. Genitourinary: Negative for dysuria. Musculoskeletal: Negative for back pain. Skin: Negative for rash. Neurological: Negative for headaches, focal weakness  ____________________________________________   PHYSICAL EXAM:  VITAL SIGNS: ED Triage Vitals  Enc Vitals Group      BP 07/09/18 1501 132/74     Pulse Rate 07/09/18 1501 89     Resp 07/09/18 1501 16     Temp 07/09/18 1501 98.2 F (36.8 C)     Temp Source 07/09/18 1501 Oral     SpO2 07/09/18 1501 99 %     Weight 07/09/18 1502 200 lb (90.7 kg)     Height 07/09/18 1502 6\' 2"  (1.88 m)     Head Circumference --      Peak Flow --      Pain Score 07/09/18 1502 7     Pain Loc --      Pain Edu? --      Excl. in [place]? --     Constitutional: Alert and oriented. Well appearing and in no acute distress. Eyes: Conjunctivae are normal Head: Atraumatic. Nose: No congestion/rhinnorhea. Mouth/Throat: Mucous membranes are moist.  Oropharynx non-erythematous. Neck: No stridor.  Cardiovascular: Normal rate, regular rhythm. Grossly normal heart sounds.  Good peripheral circulation. Respiratory: Normal respiratory effort.  No retractions. Lungs  CTAB. Gastrointestinal: Soft and nontender. No distention. No abdominal bruits. No CVA tenderness. Musculoskeletal: No lower extremity tenderness nor edema.  No joint effusions. Neurologic:  Normal speech and language. No gross focal neurologic deficits are appreciated. No gait instability. Skin:  Skin is warm, dry and intact. No rash noted. Psychiatric: Mood and affect are normal. Speech and behavior are normal.  ____________________________________________   LABS (all labs ordered are listed, but only abnormal results are displayed)  Labs Reviewed  CBC - Abnormal; Notable for the following components:      Result Value   RBC 3.84 (*)    Hemoglobin 10.9 (*)    HCT 34.7 (*)    All other components within normal limits  BASIC METABOLIC PANEL - Abnormal; Notable for the following components:   Glucose, Bld 112 (*)    Creatinine, Ser 0.51 (*)    All other components within normal limits  HEPATIC FUNCTION PANEL - Abnormal; Notable for the following components:   Albumin 3.1 (*)    All other components within normal limits  SEDIMENTATION RATE - Abnormal; Notable for the  following components:   Sed Rate 99 (*)    All other components within normal limits  CK - Abnormal; Notable for the following components:   Total CK 42 (*)    All other components within normal limits  HIV ANTIBODY (ROUTINE TESTING W REFLEX)   ____________________________________________  EKG   ____________________________________________  RADIOLOGY  ED MD interpretation: CT shows apparent in worsening of the abscess per my review and radiologist report  Official radiology report(s): Ct Pelvis W Contrast  Result Date: 07/09/2018 CLINICAL DATA:  Rapid progression of decubitus ulcer/abscess. Enlarged lymph nodes. EXAM: CT PELVIS WITH CONTRAST TECHNIQUE: Multidetector CT imaging of the pelvis was performed using the standard protocol following the bolus administration of intravenous contrast. CONTRAST:  150mL ISOVUE-300 IOPAMIDOL (ISOVUE-300) INJECTION 61% COMPARISON:  CT abdomen and pelvis 06/28/2018 FINDINGS: Urinary Tract: Urinary bladder and distal ureters are within normal limits. Bowel:  Visualized small bowel and colon are unremarkable. Vascular/Lymphatic: Atherosclerotic calcifications are present in the aorta and branch vessels without aneurysm. Reproductive:  No mass or other significant abnormality Other: Decubitus wound over the right greater trochanter has progressed. There is an open wound from the skin surface to the greater trochanter. Gas and soft tissue inflammatory changes are present. The wound measures 4.5 x 3.1 x 6.3 cm. There is gas tracking along the hamstring muscle on the right. Scrotum is unremarkable. No gas or inflammatory changes are present otherwise in the scrotum or perineum. Musculoskeletal: No osseous erosions are present. There is some degenerative change at the right greater trochanter. Sacrum is intact. Degenerative disc disease is present in the lower lumbar spine. Spinal cord stimulator enters at L3-4. IMPRESSION: 1. Progressive ulceration and open wound  extending to the greater trochanter as described. 2. Irregularity at the greater trochanter is likely degenerative without definite osteomyelitis. 3. 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 Electronically Signed   By: San Morelle M.D.   On: 07/09/2018 16:32    ____________________________________________   PROCEDURES  Procedure(s) performed:   Procedures  Critical Care performed: Critical care time 20 minutes this includes discussing the likely outcomes with the patient and his wife were fairly interested in this.  Reviewing the old CT with them and the new CT.  Discussing the patient with the surgeon as well.  ____________________________________________   INITIAL IMPRESSION / ASSESSMENT AND PLAN / ED COURSE  See HPI for discussion  of plan      ____________________________________________   FINAL CLINICAL IMPRESSION(S) / ED DIAGNOSES  Final diagnoses:  Infected decubitus ulcer, unspecified ulcer stage     ED Discharge Orders    None       Note:  This document was prepared using Dragon voice recognition software and may include unintentional dictation errors.    Nena Polio, MD 07/10/18 [phone]

## 2018-07-11 LAB — CREATININE, SERUM
Creatinine, Ser: 0.68 mg/dL (ref 0.61–1.24)
GFR calc Af Amer: >60 mL/min (ref >60)
GFR calc non Af Amer: >60 mL/min (ref >60)

## 2018-07-11 LAB — VANCOMYCIN, TROUGH: Vancomycin Tr: 17 ug/mL (ref 15–20)

## 2018-07-11 MED ORDER — CHLORHEXIDINE GLUCONATE CLOTH 2 % EX PADS
6.0000 | MEDICATED_PAD | Freq: Every day | CUTANEOUS | Status: DC
  Administered 2018-07-11 – 2018-07-20 (×7): 6 via TOPICAL

## 2018-07-11 NOTE — Plan of Care (Signed)
Awaiting for air mattress. Q2 turned. Dressing changed. IV antibiotics provided. Self I/O caths.  Problem: Education: Goal: Knowledge of General Education information will improve Description Including pain rating scale, medication(s)/side effects and non-pharmacologic comfort measures Outcome: Progressing   Problem: Health Behavior/Discharge Planning: Goal: Ability to manage health-related needs will improve Outcome: Progressing   Problem: Clinical Measurements: Goal: Ability to maintain clinical measurements within normal limits will improve Outcome: Progressing Goal: Will remain free from infection Outcome: Progressing Goal: Diagnostic test results will improve Outcome: Progressing Goal: Respiratory complications will improve Outcome: Progressing Goal: Cardiovascular complication will be avoided Outcome: Progressing

## 2018-07-11 NOTE — Progress Notes (Signed)
Patient seen on rounds this morning.  Overnight he has not experienced any fevers or chills.  His pain is well controlled.  His dressing has not yet been changed.  Antibiotics have been initiated. ID phone consult agrees with current regimen, will await further recs in AM when in-house consult available. Patient info sent to Dr. Marla Roe. Awaiting response.  Past Medical History:  Diagnosis Date  . AVM (arteriovenous malformation) spine   . Cancer [facility])    prostate, taking Casodex (hormone chemotherapy)  . History of kidney stones    several times  . Hypertension   . 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 Prostate cancer ([place])   . Status post insertion of intrathecal baclofen pump   . Status post insertion of spinal cord stimulator    Past Surgical History:  Procedure Laterality Date  . COLONOSCOPY    . CYSTOSCOPY     several times  . CYSTOSCOPY WITH DIRECT VISION INTERNAL URETHROTOMY  05/05/2017   Procedure: CYSTOSCOPY WITH DIRECT VISION INTERNAL URETHROTOMY;  Surgeon: Royston Cowper, MD;  Location: [place] [facility];  Service: Urology;;  . Consuela Mimes WITH LITHOLAPAXY N/A 05/05/2017   Procedure: CYSTOSCOPY WITH LITHOLAPAXY;  Surgeon: Royston Cowper, MD;  Location: [facility];  Service: Urology;  Laterality: N/A;  . EXTRACORPOREAL SHOCK WAVE LITHOTRIPSY    . EYE SURGERY Bilateral    cataract surgery  . HOLMIUM LASER APPLICATION N/A 84/16/6063   Procedure: HOLMIUM LASER APPLICATION;  Surgeon: Royston Cowper, MD;  Location: [facility];  Service: Urology;  Laterality: N/A;  . LUMBAR [place] SURGERY  2005   resulted in paralysis of lower extrems   No family history on file. Social History   Tobacco Use  . Smoking status: Never Smoker  . Smokeless tobacco: Former Systems developer    Types: Snuff  Substance Use Topics  . Alcohol use: No  . Drug use: No   Review of Systems  Constitutional: Negative for chills and fever.  HENT: Negative.   Respiratory: Negative.    Cardiovascular: Negative.   Gastrointestinal: Negative.   Skin:       Decubitus ulcer  All other systems reviewed and are negative.  Vitals:   07/11/18 0629 07/11/18 0925  BP: 100/60 105/60  Pulse: 61 73  Resp: 20 18  Temp: (!) 97.4 F (36.3 C) 98.5 F (36.9 C)  SpO2: 100% 100%   I/O last 3 completed shifts: In: 2099.8 [I.V.:1159.2; IV Piggyback:940.6] Out: 1000 [Urine:1000] Total I/O In: -  Out: 500 [Urine:500]    Physical Exam  Constitutional: He is well-developed, well-nourished, and in no distress.  HENT:  Head: Normocephalic and atraumatic.  Mouth/Throat: No oropharyngeal exudate.  Eyes: Right eye exhibits no discharge. Left eye exhibits no discharge.  Neck: Normal range of motion.  Cardiovascular: Normal rate and regular rhythm.  Pulmonary/Chest: Effort normal.  Abdominal: Soft. He exhibits no distension. There is no abdominal tenderness.  Musculoskeletal:        General: No deformity.  Neurological:  Paraplegic   Psychiatric: Affect normal.   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  He has a decubitus ulcer that has failed to respond to conservative local treatment.  I have sent a message to Dr. Henrietta Hoover, in plastic surgery, for her opinion.  I believe he will likely require surgical debridement with possible flap closure or debridement with future skin grafting.  I will await her input before making any  definitive surgical plans.  In addition I have placed a consult to infectious diseases to determine whether an outpatient course of intravenous antibiotics might be appropriate for him.  Will await Dr. Eusebio Friendly input as well as further directions from infectious disease.

## 2018-07-11 NOTE — Consult Note (Signed)
Pharmacy Antibiotic Note  [name] is a 67 y.o. male admitted on 07/09/2018 with cellulitis.  Pharmacy has been consulted for vancomycin dosing. He has been a patient here at [facility] recently and received IV vancomycin. I have used that data to determine the current dosing regimen. 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 Will continue current regimen and f/u Scr. Recheck in 3-5 days depending on renal fxn.     Height: 6\' 1"  (185.4 cm) Weight: 193 lb 12.6 oz (87.9 kg) IBW/kg (Calculated) : 79.9  Temp (24hrs), Avg:98.2 F (36.8 C), Min:97.4 F (36.3 C), Max:98.7 F (37.1 C)  Recent Labs  Lab 07/09/18 1510 07/11/18 0508 07/11/18 1141  WBC 9.1  --   --   CREATININE 0.51* 0.68  --   VANCOTROUGH  --   --  17    Estimated Creatinine Clearance: 102.6 mL/min (by C-G formula based on SCr of 0.68 mg/dL).    Allergies  Allergen Reactions  . Clindamycin Hives  . Doxycycline Rash  . Penicillins Rash  . Sulfamethoxazole Rash    Antimicrobials this admission: vancomycin 12/20 >>  Cefepime 12/20 >>  Microbiology results: None currently ordered or pending  Thank you for allowing pharmacy to be a part of this patient's care.  Jania Steinke A, PharmD 07/11/2018 12:45 PM

## 2018-07-12 ENCOUNTER — Ambulatory Visit: Payer: Medicare Other | Admitting: Physician Assistant

## 2018-07-12 LAB — PREALBUMIN: Prealbumin: 15.6 mg/dL — ABNORMAL LOW (ref 18–38)

## 2018-07-12 LAB — CBC
HCT: 32.7 % — ABNORMAL LOW (ref 39.0–52.0)
Hemoglobin: 10.2 g/dL — ABNORMAL LOW (ref 13.0–17.0)
MCH: 28.6 pg (ref 26.0–34.0)
MCHC: 31.2 g/dL (ref 30.0–36.0)
MCV: 91.6 fL (ref 80.0–100.0)
PLATELETS: 271 10*3/uL (ref 150–400)
RBC: 3.57 MIL/uL — ABNORMAL LOW (ref 4.22–5.81)
RDW: 12.2 % (ref 11.5–15.5)
WBC: 7.7 10*3/uL (ref 4.0–10.5)
nRBC: 0 % (ref 0.0–0.2)

## 2018-07-12 LAB — BASIC METABOLIC PANEL
ANION GAP: 5 (ref 5–15)
BUN: 22 mg/dL (ref 8–23)
CO2: 28 mmol/L (ref 22–32)
Calcium: 8.4 mg/dL — ABNORMAL LOW (ref 8.9–10.3)
Chloride: 104 mmol/L (ref 98–111)
Creatinine, Ser: 0.58 mg/dL — ABNORMAL LOW (ref 0.61–1.24)
GFR calc Af Amer: >60 mL/min (ref >60)
GFR calc non Af Amer: >60 mL/min (ref >60)
GLUCOSE: 121 mg/dL — AB (ref 70–99)
Potassium: 4.4 mmol/L (ref 3.5–5.1)
Sodium: 137 mmol/L (ref 135–145)

## 2018-07-12 MED ORDER — ENSURE ENLIVE PO LIQD: 237.0000 mL | Freq: Three times a day (TID) | ORAL | Status: DC

## 2018-07-12 MED ORDER — DAKINS (1/4 STRENGTH) 0.125 % EX SOLN
Freq: Two times a day (BID) | CUTANEOUS | Status: AC
  Administered 2018-07-12: 21:00:00
  Administered 2018-07-12 – 2018-07-13 (×2): 1
  Administered 2018-07-13 – 2018-07-14 (×3)
  Filled 2018-07-12: qty 473

## 2018-07-12 MED ORDER — ENSURE ENLIVE PO LIQD
237.0000 mL | Freq: Three times a day (TID) | ORAL | Status: DC
  Administered 2018-07-12 – 2018-07-19 (×18): 237 mL via ORAL

## 2018-07-12 NOTE — Consult Note (Signed)
[place] Nurse wound consult note Patient receiving care in [facility].  No family present. Reason for Consult: Care of right ischial wound Wound type: Stage 4 PI Pressure Injury POA: Yes Measurement: 2.5 cm x 5.2 cm x 3.3 cm.  At 12 o'clock there is a tunnel extending cephalically that measures 7.1 cm. Wound bed: What can be seen is pink. Drainage (amount, consistency, odor) Heavy amount of brown drainage. Periwound: Intact. Dressing procedure/placement/frequency: Moistened roll gauze (Kerlex) with solution.  Pack into the right ischial wound bed.  Make sure to pack the 7 cm tunnel at the 12 o'clock position.  Cover with ABD pads.  Tape in place. Change each shift. Monitor the wound area(s) for worsening of condition such as: Signs/symptoms of infection,  Increase in size,  Development of or worsening of odor, Development of pain, or increased pain at the affected locations.  Notify the medical team if any of these develop.  Thank you for the consult.  Discussed plan of care with the patient and bedside nurse.  [place] nurse will not follow at this time.  Please re-consult the [place] team if needed.  Val Riles, RN, MSN, CWOCN, CNS-BC, pager [phone]

## 2018-07-13 ENCOUNTER — Inpatient Hospital Stay
Admit: 2018-07-13 | Discharge: 2018-07-13 | Disposition: A | Discharge status: Home / Self Care | Payer: Medicare Other | Attending: Internal Medicine | Admitting: Internal Medicine

## 2018-07-13 ENCOUNTER — Inpatient Hospital Stay: Payer: Medicare Other

## 2018-07-13 DIAGNOSIS — K6289 Other specified diseases of anus and rectum: Secondary | ICD-10-CM

## 2018-07-13 DIAGNOSIS — L89314 Pressure ulcer of right buttock, stage 4: Principal | ICD-10-CM

## 2018-07-13 DIAGNOSIS — G8929 Other chronic pain: Secondary | ICD-10-CM

## 2018-07-13 DIAGNOSIS — N319 Neuromuscular dysfunction of bladder, unspecified: Secondary | ICD-10-CM

## 2018-07-13 DIAGNOSIS — G8220 Paraplegia, unspecified: Secondary | ICD-10-CM

## 2018-07-13 DIAGNOSIS — Z880 Allergy status to penicillin: Secondary | ICD-10-CM

## 2018-07-13 DIAGNOSIS — M79606 Pain in leg, unspecified: Secondary | ICD-10-CM

## 2018-07-13 DIAGNOSIS — Z9682 Presence of neurostimulator: Secondary | ICD-10-CM

## 2018-07-13 DIAGNOSIS — K592 Neurogenic bowel, not elsewhere classified: Secondary | ICD-10-CM

## 2018-07-13 DIAGNOSIS — Z978 Presence of other specified devices: Secondary | ICD-10-CM

## 2018-07-13 DIAGNOSIS — Z881 Allergy status to other antibiotic agents status: Secondary | ICD-10-CM

## 2018-07-13 DIAGNOSIS — Z87891 Personal history of nicotine dependence: Secondary | ICD-10-CM

## 2018-07-13 LAB — TROPONIN I
Troponin I: <0.03 ng/mL (ref <0.03)
Troponin I: <0.03 ng/mL (ref <0.03)
Troponin I: <0.03 ng/mL (ref <0.03)
Troponin I: <0.03 ng/mL (ref <0.03)

## 2018-07-13 LAB — HIV ANTIBODY (ROUTINE TESTING W REFLEX): HIV Screen 4th Generation wRfx: NONREACTIVE

## 2018-07-13 LAB — CREATININE, SERUM
Creatinine, Ser: 0.62 mg/dL (ref 0.61–1.24)
GFR calc Af Amer: >60 mL/min (ref >60)
GFR calc non Af Amer: >60 mL/min (ref >60)

## 2018-07-13 MED ORDER — BISACODYL 10 MG RE SUPP
10.0000 mg | Freq: Once | RECTAL | Status: AC
  Administered 2018-07-13: 10 mg via RECTAL
  Filled 2018-07-13: qty 1

## 2018-07-13 MED ORDER — FAMOTIDINE IN NACL 20-0.9 MG/50ML-% IV SOLN
20.0000 mg | Freq: Two times a day (BID) | INTRAVENOUS | Status: DC
  Administered 2018-07-13 – 2018-07-14 (×4): 20 mg via INTRAVENOUS
  Filled 2018-07-13 (×4): qty 50

## 2018-07-13 NOTE — Progress Notes (Signed)
MD notified about EKG ordered. Results indicate sinus tachycardia, Possible lateral infarct, age undetermined, cannot rule out inferior infarct, age undermined, abnormal ECG. STAT troponin levels being obtained. MD will see the patient in the room.

## 2018-07-13 NOTE — Care Management (Signed)
Patient from home with spouse.  Patient admitted with decubitus ulcer that has not improved.  Plan to continue with antibiotic therapy then surgical debridement.    Patient is open with [facility].  Corene Cornea with [facility] is aware of admission.  Patient requesting air mattress at discharge.  RNCM has reached out to [facility] to determine if patient would qualify.  ID consult pending.

## 2018-07-13 NOTE — Consult Note (Signed)
Pharmacy Antibiotic Note  [name] is a 67 y.o. male admitted on 07/09/2018 with cellulitis.  Pharmacy has been consulted for vancomycin dosing. He has been a patient here at [facility] recently and received IV vancomycin. I have used that data to determine the current dosing regimen. 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 Will continue current regimen and f/u Scr. Recheck in 3-5 days depending on renal fxn.     Height: 6\' 1"  (185.4 cm) Weight: 193 lb 12.6 oz (87.9 kg) IBW/kg (Calculated) : 79.9  Temp (24hrs), Avg:98.1 F (36.7 C), Min:97.6 F (36.4 C), Max:98.7 F (37.1 C)  Recent Labs  Lab 07/09/18 1510 07/11/18 0508 07/11/18 1141 07/12/18 1304 07/13/18 0345  WBC 9.1  --   --  7.7  --   CREATININE 0.51* 0.68  --  0.58* 0.62  VANCOTROUGH  --   --  17  --   --     Estimated Creatinine Clearance: 102.6 mL/min (by C-G formula based on SCr of 0.62 mg/dL).    Allergies  Allergen Reactions  . Clindamycin Hives  . Doxycycline Rash  . Penicillins Rash  . Sulfamethoxazole Rash    Antimicrobials this admission: vancomycin 12/20 >>  Cefepime 12/20 >>  Microbiology results: None currently ordered or pending  Thank you for allowing pharmacy to be a part of this patient's care.  Lu Duffel, PharmD, BCPS Clinical Pharmacist 07/13/2018 12:52 PM

## 2018-07-13 NOTE — Consult Note (Signed)
Reason for Consult:  Chief Complaint  Patient presents with  . Pressure Ulcer   Referring Physician: Cintron  Reason for consult- chest pain  HPI  [name] is an 67 y.o. male.  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  Hospitalist team is consulted as the patient is complaining of chest pain associated with shortness of breath.  Hospitalist team is consulted regarding the chest pain, initial troponin is negative.  EKG is abnormal when compared to the previous EKG and patient is chest pain-free during my examination.  Past Medical History:  Diagnosis Date  . AVM (arteriovenous malformation) spine   . Cancer [facility])    prostate, taking Casodex (hormone chemotherapy)  . History of kidney stones    several times  . Hypertension   . Paralysis ([place]) 2005   lower extrems from lumbar surgery  . Prostate cancer ([place])   . Status post insertion of intrathecal baclofen pump   . Status post insertion of spinal cord stimulator     Past Surgical History:  Procedure Laterality Date  . COLONOSCOPY    . CYSTOSCOPY     several times  . CYSTOSCOPY WITH DIRECT VISION INTERNAL URETHROTOMY  05/05/2017   Procedure: CYSTOSCOPY WITH DIRECT VISION INTERNAL URETHROTOMY;  Surgeon: Royston Cowper, MD;  Location: [facility];  Service: Urology;;  . Consuela Mimes WITH LITHOLAPAXY N/A 05/05/2017   Procedure: CYSTOSCOPY WITH LITHOLAPAXY;  Surgeon: Royston Cowper, MD;  Location: [facility];  Service: Urology;  Laterality: N/A;  . EXTRACORPOREAL SHOCK WAVE LITHOTRIPSY    . EYE SURGERY Bilateral    cataract surgery  . HOLMIUM LASER APPLICATION N/A 18/29/9371   Procedure: HOLMIUM LASER APPLICATION;  Surgeon: Royston Cowper, MD;  Location: [facility];  Service: Urology;  Laterality: N/A;  . LUMBAR [place] SURGERY  2005   resulted in paralysis of lower extrems   Hypertension runs in  his family  Social History:  reports that he has never smoked. He quit smokeless tobacco use about 29 years ago.  His smokeless tobacco use included snuff. He reports that he does not drink alcohol or use drugs.  Allergies:  Allergies  Allergen Reactions  . Clindamycin Hives  . Doxycycline Rash  . Penicillins Rash  . Sulfamethoxazole Rash    Medications: I have reviewed the patient's current medications.  Results for orders placed or performed during the hospital encounter of 07/09/18 (from the past 48 hour(s))  CBC     Status: Abnormal   Collection Time: 07/12/18  1:04 PM  Result Value Ref Range   WBC 7.7 4.0 - 10.5 K/uL   RBC 3.57 (L) 4.22 - 5.81 MIL/uL   Hemoglobin 10.2 (L) 13.0 - 17.0 g/dL   HCT 32.7 (L) 39.0 - 52.0 %   MCV 91.6 80.0 - 100.0 fL   MCH 28.6 26.0 - 34.0 pg   MCHC 31.2 30.0 - 36.0 g/dL   RDW 12.2 11.5 - 15.5 %   Platelets 271 150 - 400 K/uL   nRBC 0.0 0.0 - 0.2 %    Comment: Performed at [facility], [address] [place], [place] [address]  Prealbumin     Status: Abnormal   Collection Time: 07/12/18  1:04 PM  Result Value Ref Range   Prealbumin 15.6 (L) 18 - 38 mg/dL    Comment: Performed at [facility], [place] [address], [place], [place] [address]  Basic metabolic panel     Status: Abnormal   Collection Time: 07/12/18  1:04 PM  Result Value Ref Range   Sodium 137 135 - 145 mmol/L   Potassium 4.4 3.5 - 5.1 mmol/L   Chloride 104 98 - 111 mmol/L   CO2 28 22 - 32 mmol/L   Glucose, Bld 121 (H) 70 - 99 mg/dL   BUN 22 8 - 23 mg/dL   Creatinine, Ser 0.58 (L) 0.61 - 1.24 mg/dL   Calcium 8.4 (L) 8.9 - 10.3 mg/dL   GFR calc non Af Amer >60 >60 mL/min   GFR calc Af Amer >60 >60 mL/min   Anion gap 5 5 - 15    Comment: Performed at [facility], [address] [place], [place] [address]  Creatinine, serum     Status: None   Collection Time: 07/13/18  3:45 AM  Result Value Ref Range   Creatinine, Ser 0.62 0.61 - 1.24 mg/dL   GFR  calc non Af Amer >60 >60 mL/min   GFR calc Af Amer >60 >60 mL/min    Comment: Performed at [facility], [address] [place], [place] [address]  Troponin I - ONCE - STAT     Status: None   Collection Time: 07/13/18 10:32 AM  Result Value Ref Range   Troponin I <0.03 <0.03 ng/mL    Comment: Performed at [facility], [address] [place], [place] [address]  Troponin I - Now Then Q4H     Status: None   Collection Time: 07/13/18 11:11 AM  Result Value Ref Range   Troponin I <0.03 <0.03 ng/mL    Comment: Performed at [facility], [address] [place], [place] [address]  Troponin I - Now Then Q4H     Status: None   Collection Time: 07/13/18  3:01 PM  Result Value Ref Range   Troponin I <0.03 <0.03 ng/mL    Comment: Performed at [facility], [address], [place], [place] [address]    Dg Chest Port 1 View  Result Date: 07/13/2018 CLINICAL DATA:  67 year old male with chest pain and shortness of breath for 1 day. EXAM: PORTABLE CHEST 1 VIEW COMPARISON:  Portable chest 05/19/2018 and earlier. FINDINGS: Portable AP upright view at 1109 hours. Chronic lower thoracic spinal stimulator. Lung volumes and mediastinal contours are stable and within normal limits. Visualized tracheal air column is within normal limits. Allowing for portable technique the lungs are clear. No pneumothorax. Stable visualized osseous structures. IMPRESSION: No acute cardiopulmonary abnormality. Electronically Signed   By: Genevie Ann M.D.   On: 07/13/2018 11:20    ROS:  CONSTITUTIONAL: Denies fevers, chills. Denies any fatigue, weakness.  EYES: Denies blurry vision, double vision, eye pain. EARS, NOSE, THROAT: Denies tinnitus, ear pain, hearing loss. RESPIRATORY: Denies cough, wheeze, shortness of breath.  CARDIOVASCULAR: Denies chest pain, palpitations, edema.  GASTROINTESTINAL: Denies nausea, vomiting, diarrhea, abdominal pain. Denies bright red blood per  rectum. GENITOURINARY: Denies dysuria, hematuria. ENDOCRINE: Denies nocturia or thyroid problems. HEMATOLOGIC AND LYMPHATIC: Denies easy bruising or bleeding. SKIN: Denies rash or lesion. MUSCULOSKELETAL: Denies pain in neck, back, shoulder, knees, hips or arthritic symptoms.  NEUROLOGIC: Denies paralysis, paresthesias.  PSYCHIATRIC: Denies anxiety or depressive symptoms. Blood pressure (!) 160/86, pulse 92, temperature 98.5 F (36.9 C), temperature source Oral, resp. rate 18, height 6\' 1"  (1.854 m), weight 87.9 kg, SpO2 97 %.   PHYSICAL EXAMINATION:  GENERAL: Well-nourished, well-developed currently in no acute distress.  HEAD:  Normocephalic, atraumatic.  EYES: Pupils equal, round, and reactive to light. Extraocular muscles intact. No scleral icterus.  MOUTH: Moist mucosal membranes. Dentition intact. No abscess noted. EARS, NOSE, THROAT: Clear without exudates. No external lesions.  NECK: Supple. No thyromegaly. No nodules. No JVD.  PULMONARY: Clear to auscultation bilaterally without wheezes, rales, or rhonchi. No use of accessory muscles. Good respiratory effort. CHEST: Nontender to palpation.  CARDIOVASCULAR: S1, S2, regular rate and rhythm. No murmurs, rubs, or gallops.  GASTROINTESTINAL: Soft, nontender, nondistended. No masses. Positive bowel sounds. No hepatosplenomegaly. MUSCULOSKELETAL: No swelling, clubbing, edema. Range of motion full in all extremities. NEUROLOGIC: Cranial nerves II-XII intact. No gross focal neurological deficits. Sensation intact. Reflexes intact. SKIN: 8 cm decubitus ulcer with bone at the base covered with dressing. PSYCHIATRIC: Mood, affect within normal limits. Patient awake, alert, oriented x 3. Insight and judgment intact.   Assessment/Plan:  #Chest pain atypical Monitor patient on telemetry Cycle cardiac biomarkers EKG nonspecific ST-T changes Echocardiogram ordered-preliminary report with normal ejection fraction 55 to 60% and no significant  valvular abnormalities no abnormalities Cardiology consult placed to Dr. Ubaldo Glassing, no urgent cardiac interventions needed at this time Sublingual nitroglycerin as needed  #Hypertension-continue current medications Cozaar and titrate as needed  #Decubitus ulcer nonhealing-right ischial area For surgical debridement, care per attending physician Continue IV antibiotics cefepime and vancomycin   Thank you for allowing hospitalist team to take care of this patient  TOTAL TIME TAKING CARE OF THIS PATIENT: 42 minutes.   Note: This dictation was prepared with Dragon dictation along with smaller phrase technology. Any transcriptional errors that result from this process are unintentional.   @MEC @ Pager - [phone] 07/13/2018, 5:51 PM

## 2018-07-13 NOTE — Consult Note (Signed)
NAME: [name]  DOB: 12-09-1950  MRN: [id number]  Date/Time: 07/13/2018 8:40 PM  Dr.Cannon Subjective:  REASON FOR CONSULT: ulcer rt gluteal area ? 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 PT says in May 2019 he developed an ulcer on the rt buttock and he thinks due to his bath chair. He was followed at the wound clinic and treated locally. He had wound vac for some time and as it was not staying in place it was removed.  As it was getting worse he was sent to the hospital. I am asked to see the patient for antibiotic management. He has had chronic rectal pain and has a baclofen pump and spine stimulator. In April 2019 he got an caudal epidural steroid injection He has no fever or chills He has been started on vanco/cefepime Past Medical History:  Diagnosis Date  . AVM (arteriovenous malformation) spine   . Cancer [name]    prostate, taking Casodex (hormone chemotherapy)  . History of kidney stones    several times  . Hypertension   . Paralysis ([place]) 2005   lower extrems from lumbar surgery  . Prostate cancer ([place])   . Status post insertion of intrathecal baclofen pump   . Status post insertion of spinal cord stimulator     Past Surgical History:  Procedure Laterality Date  . COLONOSCOPY    . CYSTOSCOPY     several times  . CYSTOSCOPY WITH DIRECT VISION INTERNAL URETHROTOMY  05/05/2017   Procedure: CYSTOSCOPY WITH DIRECT VISION INTERNAL URETHROTOMY;  Surgeon: Royston Cowper, MD;  Location: [facility];  Service: Urology;;  . Consuela Mimes WITH LITHOLAPAXY N/A 05/05/2017   Procedure: CYSTOSCOPY WITH LITHOLAPAXY;  Surgeon: Royston Cowper, MD;  Location: [facility];  Service: Urology;  Laterality: N/A;  . EXTRACORPOREAL SHOCK WAVE LITHOTRIPSY    . EYE SURGERY Bilateral    cataract surgery  . HOLMIUM LASER APPLICATION  N/A 39/76/7341   Procedure: HOLMIUM LASER APPLICATION;  Surgeon: Royston Cowper, MD;  Location: [facility];  Service: Urology;  Laterality: N/A;  . [place] SURGERY  2005   resulted in paralysis of lower extrems    SH Lives with his wife Ex snuff user Non smoker No alcohol   No family history of HTN or DM  Allergies  Allergen Reactions  . Clindamycin Hives  . Doxycycline Rash  . Penicillins Rash  . Sulfamethoxazole Rash   ? Current Facility-Administered Medications  Medication Dose Route Frequency Provider Last Rate Last Dose  . baclofen (LIORESAL) tablet 10 mg  10 mg Oral Daily PRN Fredirick Maudlin, MD      . bicalutamide (CASODEX) tablet 50 mg  50 mg Oral Daily Fredirick Maudlin, MD   50 mg at 07/13/18 1724  . ceFEPIme (MAXIPIME) 2 g in sodium chloride 0.9 % 100 mL IVPB  2 g Intravenous Q12H Fredirick Maudlin, MD   Stopped at 07/13/18 1041  . Chlorhexidine Gluconate Cloth 2 % PADS 6 each  6 each Topical Q0600 Fredirick Maudlin, MD   6 each at 07/13/18 [phone]  . enoxaparin (LOVENOX) injection 40 mg  40 mg Subcutaneous Q24H Fredirick Maudlin, MD   40 mg at 07/12/18 2036  . famotidine (PEPCID) IVPB 20 mg premix  20 mg Intravenous Q12H Herbert Pun, MD   Stopped at 07/13/18 1143  . feeding  supplement (ENSURE ENLIVE) (ENSURE ENLIVE) liquid 237 mL  237 mL Oral TID BM Fredirick Maudlin, MD   237 mL at 07/13/18 1411  . ferrous sulfate tablet 325 mg  325 mg Oral Daily Fredirick Maudlin, MD   325 mg at 07/13/18 0935  . gabapentin (NEURONTIN) capsule 800 mg  800 mg Oral TID Fredirick Maudlin, MD   800 mg at 07/13/18 1700  . HYDROmorphone (DILAUDID) injection 0.5 mg  0.5 mg Intravenous Q2H PRN Fredirick Maudlin, MD   0.5 mg at 07/13/18 1836  . ibuprofen (ADVIL,MOTRIN) tablet 600 mg  600 mg Oral Q6H PRN Fredirick Maudlin, MD      . ketorolac (TORADOL) 15 MG/ML injection 15 mg  15 mg Intravenous Q6H Fredirick Maudlin, MD   15 mg at 07/13/18 1725  . lactated ringers infusion   Intravenous  Continuous Fredirick Maudlin, MD 50 mL/hr at 07/13/18 2010    . losartan (COZAAR) tablet 100 mg  100 mg Oral Daily Fredirick Maudlin, MD   100 mg at 07/13/18 0936  . methenamine (MANDELAMINE) tablet 1 g  1 g Oral QID Fredirick Maudlin, MD   1 g at 07/13/18 1725  . mirabegron ER (MYRBETRIQ) tablet 50 mg  50 mg Oral Daily Fredirick Maudlin, MD   50 mg at 07/13/18 0935  . ondansetron (ZOFRAN-ODT) disintegrating tablet 4 mg  4 mg Oral Q6H PRN Fredirick Maudlin, MD       Or  . ondansetron [facility]) injection 4 mg  4 mg Intravenous Q6H PRN Fredirick Maudlin, MD      . oxybutynin (DITROPAN-XL) 24 hr tablet 10 mg  10 mg Oral Melvern Banker, MD   10 mg at 07/13/18 0515  . oxyCODONE (Oxy IR/ROXICODONE) immediate release tablet 5-10 mg  5-10 mg Oral Q4H PRN Fredirick Maudlin, MD   10 mg at 07/13/18 1700  . PAIN MANAGEMENT INTRATHECAL (IT) PUMP 1 each  1 each Intrathecal Continuous Fredirick Maudlin, MD      . polyethylene glycol (MIRALAX / GLYCOLAX) packet 17 g  17 g Oral Daily Fredirick Maudlin, MD   17 g at 07/13/18 0936  . polyvinyl alcohol (LIQUIFILM TEARS) 1.4 % ophthalmic solution 1 drop  1 drop Both Eyes Daily PRN Fredirick Maudlin, MD      . sodium hypochlorite (DAKIN'S 1/4 STRENGTH) topical solution   Irrigation BID Fredirick Maudlin, MD   1 application at 24/26/83 0900  . vancomycin (VANCOCIN) 1,250 mg in sodium chloride 0.9 % 250 mL IVPB  1,250 mg Intravenous Q12H Dallie Piles, [facility]   Stopped at 07/13/18 1613  . vitamin C (ASCORBIC ACID) tablet 1,000 mg  1,000 mg Oral TID Fredirick Maudlin, MD   1,000 mg at 07/13/18 1724     Abtx:  Anti-infectives (From admission, onward)   Start     Dose/Rate Route Frequency Ordered Stop   07/10/18 1315  ceFEPIme (MAXIPIME) 2 g in sodium chloride 0.9 % 100 mL IVPB     2 g 200 mL/hr over 30 Minutes Intravenous Every 12 hours 07/10/18 1314     07/10/18 0000  vancomycin (VANCOCIN) 1,250 mg in sodium chloride 0.9 % 250 mL IVPB     1,250 mg 166.7 mL/hr over 90  Minutes Intravenous Every 12 hours 07/09/18 2030     07/09/18 1715  vancomycin (VANCOCIN) IVPB 1000 mg/200 mL premix     1,000 mg 200 mL/hr over 60 Minutes Intravenous  Once 07/09/18 1714 07/09/18 1914   07/09/18 1715  ceFEPIme (MAXIPIME) 1 g in sodium  chloride 0.9 % 100 mL IVPB     1 g 200 mL/hr over 30 Minutes Intravenous  Once 07/09/18 1714 07/09/18 1802      REVIEW OF SYSTEMS:  Const: negative fever, negative chills, negative weight loss Eyes: negative diplopia or visual changes, negative eye pain ENT: negative coryza, negative sore throat Resp: negative cough, hemoptysis, dyspnea Cards: negative for chest pain, palpitations, lower extremity edema GU: CIC GI: Negative for abdominal pain, diarrhea, bleeding, constipation Skin: negative for rash and pruritus Heme: negative for easy bruising and gum/nose bleeding MS: negative for myalgias, arthralgias, back pain and muscle weakness Neurolo:negative for headaches, dizziness, vertigo, memory problems  Psych: negative for feelings of anxiety, depression  Endocrine: no polyuria Allergy/Immunology- he says he had a small rash to doxy and clinda many years ago but is tolerating it now Objective:  VITALS:  BP (!) 154/81 (BP Location: Left Arm)   Pulse 90   Temp 98.3 F (36.8 C) (Oral)   Resp 16   Ht 6\' 1"  (1.854 m)   Wt 87.9 kg   SpO2 100%   BMI 25.57 kg/m  PHYSICAL EXAM:  General: Alert, cooperative, no distress, appears stated age.  Head: Normocephalic, without obvious abnormality, atraumatic. Eyes: Conjunctivae clear, anicteric sclerae. Pupils are equal ENT Nares normal. No drainage or sinus tenderness. Lips, mucosa, and tongue normal. No Thrush Neck: Supple, symmetrical, no adenopathy, thyroid: non tender no carotid bruit and no JVD. Back: No CVA tenderness. Spine stimulator rt flank area- scar seen Lungs: Clear to auscultation bilaterally. No Wheezing or Rhonchi. No rales. Heart: Regular rate and rhythm, no murmur, rub  or gallop. Abdomen: Soft, non-tender,not distended. Bowel sounds normal. No masses, baclofen pump felt RTLQ  Extremities:  Rt gluteal fold there is an ulcer which is 6 cm deep- bone probed    atraumatic, no cyanosis. No edema. No clubbing Skin: No rashes or lesions. Or bruising Lymph: Cervical, supraclavicular normal. Neurologic: paraperesis Pertinent Labs Lab Results CBC    Component Value Date/Time   WBC 7.7 07/12/2018 1304   RBC 3.57 (L) 07/12/2018 1304   HGB 10.2 (L) 07/12/2018 1304   HGB 16.3 09/27/2013 2113   HCT 32.7 (L) 07/12/2018 1304   HCT 48.0 09/27/2013 2113   PLT 271 07/12/2018 1304   PLT 182 09/27/2013 2113   MCV 91.6 07/12/2018 1304   MCV 87 09/27/2013 2113   MCH 28.6 07/12/2018 1304   MCHC 31.2 07/12/2018 1304   RDW 12.2 07/12/2018 1304   RDW 13.5 09/27/2013 2113   LYMPHSABS 0.9 05/19/2018 2318   LYMPHSABS 1.7 09/27/2013 2113   MONOABS 0.9 05/19/2018 2318   MONOABS 1.4 (H) 09/27/2013 2113   EOSABS 0.0 05/19/2018 2318   EOSABS 0.2 09/27/2013 2113   BASOSABS 0.0 05/19/2018 2318   BASOSABS 0.0 09/27/2013 2113    CMP Latest Ref Rng & Units 07/13/2018 07/12/2018 07/11/2018  Glucose 70 - 99 mg/dL - 121(H) -  BUN 8 - 23 mg/dL - 22 -  Creatinine 0.61 - 1.24 mg/dL 0.62 0.58(L) 0.68  Sodium 135 - 145 mmol/L - 137 -  Potassium 3.5 - 5.1 mmol/L - 4.4 -  Chloride 98 - 111 mmol/L - 104 -  CO2 22 - 32 mmol/L - 28 -  Calcium 8.9 - 10.3 mg/dL - 8.4(L) -  Total Protein 6.5 - 8.1 g/dL - - -  Total Bilirubin 0.3 - 1.2 mg/dL - - -  Alkaline Phos 38 - 126 U/L - - -  AST 15 - 41 U/L - - -  ALT 0 - 44 U/L - - -      Microbiology: Recent Results (from the past 240 hour(s))  Aerobic Culture (superficial specimen)     Status: None   Collection Time: 07/05/18  9:15 AM  Result Value Ref Range Status   Specimen Description   Final    ABSCESS Performed at [facility], [address] [place], [place] [address]    Special Requests   Final     NONE Performed at [name], [address] [place], [place] [address]    Gram Stain   Final    FEW WBC PRESENT, PREDOMINANTLY PMN RARE GRAM POSITIVE COCCI Performed at [facility], [place] [address], [place], [place] [address]    Culture   Final    MODERATE METHICILLIN RESISTANT STAPHYLOCOCCUS AUREUS   Report Status 07/07/2018 FINAL  Final   Organism ID, Bacteria METHICILLIN RESISTANT STAPHYLOCOCCUS AUREUS  Final      Susceptibility   Methicillin resistant staphylococcus aureus - MIC*    CIPROFLOXACIN >=8 RESISTANT Resistant     ERYTHROMYCIN >=8 RESISTANT Resistant     GENTAMICIN <=0.5 SENSITIVE Sensitive     OXACILLIN >=4 RESISTANT Resistant     TETRACYCLINE <=1 SENSITIVE Sensitive     VANCOMYCIN 1 SENSITIVE Sensitive     TRIMETH/SULFA >=320 RESISTANT Resistant     CLINDAMYCIN <=0.25 SENSITIVE Sensitive     RIFAMPIN <=0.5 SENSITIVE Sensitive     Inducible Clindamycin NEGATIVE Sensitive     * MODERATE METHICILLIN RESISTANT STAPHYLOCOCCUS AUREUS   IMAGING RESULTS: ? 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 ?Rt ischial/gluteal fold pressure ulcer- probes to bone- tunneling Stage IV- eventhough imaging does not show any bone involvement will have to treat like osteomyelitis. recent MRSA in the culture- awaiting debridement and deep culture. Continue vanco and cefepime currently   Paraparesis following L1 AVM embolization  Neurogenic bladder- CIC Neurogenic bowel- Constipated for the past week  Chronic rectal and leg pain- has baclofen pump and spine stimulator  Anemia -  ______________________________________________ Discussed with patient,and  requesting provider

## 2018-07-13 NOTE — Progress Notes (Signed)
[place] [facility] Day(s): 4.   Post op day(s):  Marland Kitchen   Interval History: Patient seen and examined, no acute events or new complaints overnight. Patient reports having chest pain, denies shortness of breath. Pain does not radiates to arm or abdomen. Pain has not improved with dilaudid.    Vital signs in last 24 hours: [min-max] current  Temp:  [97.6 F (36.4 C)-98.7 F (37.1 C)] 98.1 F (36.7 C) (12/24 1101) Pulse Rate:  [54-111] 89 (12/24 1101) Resp:  [17-20] 17 (12/24 1101) BP: (147-179)/(77-93) 164/85 (12/24 1101) SpO2:  [100 %] 100 % (12/24 1101)     Height: 6\' 1"  (185.4 cm) Weight: 87.9 kg BMI (Calculated): 25.57   Physical Exam:  Constitutional: alert, cooperative and no distress  Respiratory: breathing non-labored at rest  Cardiovascular: regular rate and sinus rhythm  Gastrointestinal: soft, non-tender, and non-distended Skin: right ischial wound with pink granulation tissue, no necrotic tissue, moderated amount of drainage.   Labs:  CBC Latest Ref Rng & Units 07/12/2018 07/09/2018 05/21/2018  WBC 4.0 - 10.5 K/uL 7.7 9.1 10.9(H)  Hemoglobin 13.0 - 17.0 g/dL 10.2(L) 10.9(L) 12.0(L)  Hematocrit 39.0 - 52.0 % 32.7(L) 34.7(L) 35.8(L)  Platelets 150 - 400 K/uL 271 350 209   CMP Latest Ref Rng & Units 07/13/2018 07/12/2018 07/11/2018  Glucose 70 - 99 mg/dL - 121(H) -  BUN 8 - 23 mg/dL - 22 -  Creatinine 0.61 - 1.24 mg/dL 0.62 0.58(L) 0.68  Sodium 135 - 145 mmol/L - 137 -  Potassium 3.5 - 5.1 mmol/L - 4.4 -  Chloride 98 - 111 mmol/L - 104 -  CO2 22 - 32 mmol/L - 28 -  Calcium 8.9 - 10.3 mg/dL - 8.4(L) -  Total Protein 6.5 - 8.1 g/dL - - -  Total Bilirubin 0.3 - 1.2 mg/dL - - -  Alkaline Phos 38 - 126 U/L - - -  AST 15 - 41 U/L - - -  ALT 0 - 44 U/L - - -    Imaging studies: No new pertinent imaging studies   Assessment/Plan:  67 y.o. male with decubitus ulcer that has not improved with loca care. Patient being optimized with antibiotic therapy to  prepare patient for surgical debridement.  Today with chest pain. There are concerning EKG finding that are not able to compared with previous EKG to see if they are acute or old findings. I ordered Troponin that has been negative. I ordered Oxygen therapy and a chest xray. Hospitalist consulted and appreciated. Hospitalist will consult Cardiologist due to EKG changes. Will continue medical management and local care while cardiac evaluation is done.   Arnold Long, MD

## 2018-07-13 NOTE — Progress Notes (Signed)
*  PRELIMINARY RESULTS* Echocardiogram 2D Echocardiogram has been performed.  Sherrie Sport 07/13/2018, 12:30 PM

## 2018-07-13 NOTE — Consult Note (Signed)
Cardiology Consultation Note    Patient ID: [name], MRN: [id number], DOB/AGE: 08-29-1950 67 y.o. Admit date: 07/09/2018   Date of Consult: 07/13/2018 Primary Physician: [name], MD Primary Cardiologist:    Chief Complaint: chest pain Reason for Consultation: chest pain Requesting MD: Dr. Margaretmary Eddy  HPI: [name] is a 67 y.o. 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  He was admitted with chronic progressive pressure also.  Today he developed some chest tightness.  EKG shows sinus tachycardia with no significant ST-T wave changes.  Initial troponin was normal.  Pain had improved.  He described it as a numbness in his chest".  Echocardiogram revealed no regional wall motion abnormality with ejection fraction of 55 to 60%.  There was no significant valvular abnormalities.  Past Medical History:  Diagnosis Date  . AVM (arteriovenous malformation) spine   . Cancer [facility])    prostate, taking Casodex (hormone chemotherapy)  . History of kidney stones    several times  . Hypertension   . Paralysis ([place]) 2005   lower extrems from lumbar surgery  . Prostate cancer ([place])   . Status post insertion of intrathecal baclofen pump   . Status post insertion of spinal cord stimulator       Surgical History:  Past Surgical History:  Procedure Laterality Date  . COLONOSCOPY    . CYSTOSCOPY     several times  . CYSTOSCOPY WITH DIRECT VISION INTERNAL URETHROTOMY  05/05/2017   Procedure: CYSTOSCOPY WITH DIRECT VISION INTERNAL URETHROTOMY;  Surgeon: Royston Cowper, MD;  Location: [facility];  Service: Urology;;  . Consuela Mimes WITH LITHOLAPAXY N/A 05/05/2017   Procedure: CYSTOSCOPY WITH LITHOLAPAXY;  Surgeon: Royston Cowper, MD;  Location: [facility];  Service: Urology;  Laterality: N/A;  . EXTRACORPOREAL SHOCK WAVE LITHOTRIPSY    .  EYE SURGERY Bilateral    cataract surgery  . HOLMIUM LASER APPLICATION N/A [id number]   Procedure: HOLMIUM LASER APPLICATION;  Surgeon: Royston Cowper, MD;  Location: [facility];  Service: Urology;  Laterality: N/A;  . LUMBAR [place] SURGERY  2005   resulted in paralysis of lower extrems     Home Meds: Prior to Admission medications   Medication Sig Start Date End Date Taking? Authorizing Provider  acetaminophen (TYLENOL) 500 MG tablet Take 1,000 mg by mouth 2 (two) times daily.   Yes [provider]  baclofen (LIORESAL) 10 MG tablet Take 10 mg by mouth daily as needed for muscle spasms.    Yes [provider]  bicalutamide (CASODEX) 50 MG tablet Take 50 mg by mouth daily. 01/06/17  Yes [provider]  Ferrous Sulfate (IRON) 325 (65 Fe) MG TABS Take 325 mg by mouth daily. 06/25/18 06/25/19 Yes [provider]  gabapentin (NEURONTIN) 800 MG tablet Take 800 mg by mouth 3 (three) times daily.    Yes [provider]  losartan (COZAAR) 100 MG tablet Take 100 mg by mouth daily.   Yes [provider]  methenamine (HIPREX) 1 g tablet Take 1 g by mouth 3 (three) times daily.    Yes [provider]  mirabegron ER (MYRBETRIQ) 50 MG TB24 tablet Take 50 mg by mouth daily.    Yes [provider]  oxybutynin (DITROPAN-XL) 10 MG 24 hr tablet Take 10 mg by mouth every morning.    Yes [provider]  PAIN MANAGEMENT INTRATHECAL, IT, PUMP 1 each by Intrathecal route continuous. Intrathecal (IT) medication:  Baclofen, Dilaudid   Yes [provider]  polyethylene glycol powder (GLYCOLAX/MIRALAX) powder Take 17 g by mouth daily.  08/09/10  Yes [provider]  Propylene Glycol (SYSTANE BALANCE) 0.6 % SOLN Place 1 drop into both eyes daily as needed (dry eyes).   Yes [provider]  vitamin C (ASCORBIC ACID) 500 MG tablet Take 1,000 mg by mouth 3 (three) times daily.    Yes [provider]  collagenase  (SANTYL) ointment Apply topically daily. Patient not taking: Reported on 07/09/2018 05/22/18   Epifanio Lesches, MD  nutrition supplement, JUVEN, (JUVEN) PACK Take 1 packet by mouth 2 (two) times daily between meals. Patient not taking: Reported on 07/09/2018 05/22/18   Epifanio Lesches, MD    Inpatient Medications:  . bicalutamide  50 mg Oral Daily  . Chlorhexidine Gluconate Cloth  6 each Topical Q0600  . enoxaparin (LOVENOX) injection  40 mg Subcutaneous Q24H  . feeding supplement (ENSURE ENLIVE)  237 mL Oral TID BM  . ferrous sulfate  325 mg Oral Daily  . gabapentin  800 mg Oral TID  . ketorolac  15 mg Intravenous Q6H  . losartan  100 mg Oral Daily  . methenamine  1 g Oral QID  . mirabegron ER  50 mg Oral Daily  . oxybutynin  10 mg Oral [id number]  . polyethylene glycol  17 g Oral Daily  . sodium hypochlorite   Irrigation BID  . vitamin C  1,000 mg Oral TID   . ceFEPime (MAXIPIME) IV 2 g (07/13/18 0948)  . famotidine (PEPCID) IV 20 mg (07/13/18 1058)  . lactated ringers 50 mL/hr at 07/13/18 0500  . Pain Management INTRATHECAL (IT) Pump    . vancomycin Stopped (07/13/18 0005)    Allergies:  Allergies  Allergen Reactions  . Clindamycin Hives  . Doxycycline Rash  . Penicillins Rash  . Sulfamethoxazole Rash    Social History   Socioeconomic History  . Marital status: Married    Spouse name: Not on file  . Number of children: Not on file  . Years of education: Not on file  . Highest education level: Not on file  Occupational History  . Not on file  Social Needs  . Financial resource strain: Not on file  . Food insecurity:    Worry: Not on file    Inability: Not on file  . Transportation needs:    Medical: Not on file    Non-medical: Not on file  Tobacco Use  . Smoking status: Never Smoker  . Smokeless tobacco: Former Systems developer    Types: Snuff  Substance and Sexual Activity  . Alcohol use: No  . Drug use: No  . Sexual activity: Not on file  Lifestyle  .  Physical activity:    Days per week: Not on file    Minutes per session: Not on file  . Stress: Not on file  Relationships  . Social connections:    Talks on phone: Not on file    Gets together: Not on file    Attends religious service: Not on file    Active member of club or organization: Not on file    Attends meetings of clubs or organizations: Not on file    Relationship status: Not on file  . Intimate partner violence:    Fear of current or ex partner: Not on file    Emotionally abused:  Not on file    Physically abused: Not on file    Forced sexual activity: Not on file  Other Topics Concern  . Not on file  Social History Narrative  . Not on file     No family history on file.   Review of Systems: A 12-system review of systems was performed and is negative except as noted in the HPI.  Labs: Recent Labs    07/13/18 1032 07/13/18 1111  TROPONINI <0.03 <0.03   Lab Results  Component Value Date   WBC 7.7 07/12/2018   HGB 10.2 (L) 07/12/2018   HCT 32.7 (L) 07/12/2018   MCV 91.6 07/12/2018   PLT 271 07/12/2018    Recent Labs  Lab 07/09/18 1510  07/12/18 1304 07/13/18 0345  NA 139  --  137  --   K 3.9  --  4.4  --   CL 102  --  104  --   CO2 28  --  28  --   BUN 16  --  22  --   CREATININE 0.51*   < > 0.58* 0.62  CALCIUM 9.1  --  8.4*  --   PROT 7.7  --   --   --   BILITOT 0.5  --   --   --   ALKPHOS 50  --   --   --   ALT 30  --   --   --   AST 19  --   --   --   GLUCOSE 112*  --  121*  --    < > = values in this interval not displayed.   No results found for: CHOL, HDL, LDLCALC, TRIG No results found for: DDIMER  Radiology/Studies:  Ct Pelvis W Contrast  Result Date: 07/09/2018 CLINICAL DATA:  Rapid progression of decubitus ulcer/abscess. Enlarged lymph nodes. EXAM: CT PELVIS WITH CONTRAST TECHNIQUE: Multidetector CT imaging of the pelvis was performed using the standard protocol following the bolus administration of intravenous contrast.  CONTRAST:  171mL ISOVUE-300 IOPAMIDOL (ISOVUE-300) INJECTION 61% COMPARISON:  CT abdomen and pelvis 06/28/2018 FINDINGS: Urinary Tract: Urinary bladder and distal ureters are within normal limits. Bowel:  Visualized small bowel and colon are unremarkable. Vascular/Lymphatic: Atherosclerotic calcifications are present in the aorta and branch vessels without aneurysm. Reproductive:  No mass or other significant abnormality Other: Decubitus wound over the right greater trochanter has progressed. There is an open wound from the skin surface to the greater trochanter. Gas and soft tissue inflammatory changes are present. The wound measures 4.5 x 3.1 x 6.3 cm. There is gas tracking along the hamstring muscle on the right. Scrotum is unremarkable. No gas or inflammatory changes are present otherwise in the scrotum or perineum. Musculoskeletal: No osseous erosions are present. There is some degenerative change at the right greater trochanter. Sacrum is intact. Degenerative disc disease is present in the lower lumbar spine. Spinal cord stimulator enters at L3-4. IMPRESSION: 1. Progressive ulceration and open wound extending to the greater trochanter as described. 2. Irregularity at the greater trochanter is likely degenerative without definite osteomyelitis. 3. 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 Electronically Signed   By: San Morelle M.D.   On: 07/09/2018 16:32   Ct Abdomen Pelvis W Contrast  Result Date: 06/28/2018 CLINICAL DATA:  Anemia. Decubitus ulcer of right ischium. Weight loss. EXAM: CT ABDOMEN AND PELVIS WITH CONTRAST TECHNIQUE: Multidetector CT imaging of the abdomen and pelvis was performed using the standard protocol following bolus administration  of intravenous contrast. CONTRAST:  147mL ISOVUE-300 IOPAMIDOL (ISOVUE-300) INJECTION 61% COMPARISON:  04/07/2017 FINDINGS: Lower chest: Lung bases are clear. No effusions. Heart is normal size. Hepatobiliary: No focal hepatic  abnormality. Gallbladder unremarkable. Pancreas: No focal abnormality or ductal dilatation. Spleen: No focal abnormality.  Normal size. Adrenals/Urinary Tract: Small scattered low-density lesions within the left kidney, likely small cysts. No hydronephrosis. Adrenal glands and urinary bladder unremarkable. Stomach/Bowel: Moderate stool burden throughout the colon. Stomach, large and small bowel grossly unremarkable. Vascular/Lymphatic: Aortic atherosclerosis. No enlarged abdominal or pelvic lymph nodes. Reproductive: No visible focal abnormality. Other: No free fluid or free air. Musculoskeletal: There is subcutaneous soft tissue stranding and possible early abscess formation noted in the right buttock/perineal region extending to the right ischium. Area with rim enhancement and possible early fluid formation with a few locules of gas measures up to 6.7 cm in greatest cross-sectional diameter. No bony changes seen to suggest osteomyelitis. Degenerative changes in the lumbar spine. Spinal stimulator device remains in place, unchanged. IMPRESSION: Right decubitus ulcer with subcutaneous soft tissue stranding in the right buttock and ischial region with possible early abscess formation or phlegmon measuring up to 6.7 cm. Moderate stool burden in the colon. No acute findings in the abdomen or pelvis. Electronically Signed   By: Rolm Baptise M.D.   On: 06/28/2018 10:55   Dg Chest Port 1 View  Result Date: 07/13/2018 CLINICAL DATA:  67 year old male with chest pain and shortness of breath for 1 day. EXAM: PORTABLE CHEST 1 VIEW COMPARISON:  Portable chest 05/19/2018 and earlier. FINDINGS: Portable AP upright view at 1109 hours. Chronic lower thoracic spinal stimulator. Lung volumes and mediastinal contours are stable and within normal limits. Visualized tracheal air column is within normal limits. Allowing for portable technique the lungs are clear. No pneumothorax. Stable visualized osseous structures. IMPRESSION: No  acute cardiopulmonary abnormality. Electronically Signed   By: Genevie Ann M.D.   On: 07/13/2018 11:20    Wt Readings from Last 3 Encounters:  07/09/18 87.9 kg  05/22/18 70.9 kg  08/28/17 98.4 kg    EKG: nsr with nonspecific st t wave changes.  Echo-normal lv function. No regional wall motion abnormalities. No significant valve abnormalities.   Physical Exam:  Blood pressure (!) 164/85, pulse 89, temperature 98.1 F (36.7 C), temperature source Oral, resp. rate 17, height 6\' 1"  (1.854 m), weight 87.9 kg, SpO2 100 %. Body mass index is 25.57 kg/m. General: Well developed, well nourished, in no acute distress. Head: Normocephalic, atraumatic, sclera non-icteric, no xanthomas, nares are without discharge.  Neck: Negative for carotid bruits. JVD not elevated. Lungs: Clear bilaterally to auscultation without wheezes, rales, or rhonchi. Breathing is unlabored. Heart: RRR with S1 S2. No murmurs, rubs, or gallops appreciated. Abdomen: Soft, non-tender, non-distended with normoactive bowel sounds. No hepatomegaly. No rebound/guarding. No obvious abdominal masses. Msk:  Strength and tone appear normal for age. Extremities: No clubbing or cyanosis. No edema.  Distal pedal pulses are 2+ and equal bilaterally. Neuro: Alert and oriented X 3. L4 paraplegia Psych:  Responds to questions appropriately with a normal affect.     Assessment and Plan  67 year old male with paraplegia secondary to spinal cord injury admitted with a pressure ulcer in his right ischial area.  He has been seen by surgery for this.  Currently on IV antibiotics.  Likely plastic surgery flap would be necessary in the future.  Cardiology was consulted due to some chest tightness along with tachycardia.  Electrocardiogram showed no significant ischemic changes.  No regional wall motion abnormality on echo with preserved LV function.  Initial troponin is negative.  Symptoms of chest pain are fairly atypical for angina.  Would continue  to treat his decubiti and will follow.  No need for urgent cardiac intervention at present.  Signed, Teodoro Spray MD 07/13/2018, 11:55 AM Pager: [phone]

## 2018-07-14 LAB — ECHOCARDIOGRAM COMPLETE
Height: 73 in
Weight: 3100.55 oz

## 2018-07-14 LAB — URINALYSIS, ROUTINE W REFLEX MICROSCOPIC
Bilirubin Urine: NEGATIVE
Glucose, UA: NEGATIVE mg/dL
Hgb urine dipstick: NEGATIVE
Ketones, ur: NEGATIVE mg/dL
LEUKOCYTES UA: NEGATIVE
Nitrite: NEGATIVE
Protein, ur: NEGATIVE mg/dL
Specific Gravity, Urine: 1.009 (ref 1.005–1.030)
pH: 8 (ref 5.0–8.0)

## 2018-07-14 NOTE — Progress Notes (Signed)
CC: decubitus ulcer Subjective: Doing well, no acute issues. Taking PO. AVSS Cardiac w/u negative. No more C/P.  Objective: Vital signs in last 24 hours: Temp:  [98.1 F (36.7 C)-98.5 F (36.9 C)] 98.4 F (36.9 C) (12/25 0559) Pulse Rate:  [58-111] 58 (12/25 0559) Resp:  [16-18] 16 (12/25 0559) BP: (131-179)/(81-93) 131/84 (12/25 0559) SpO2:  [97 %-100 %] 98 % (12/25 0559)    Intake/Output from previous day: 12/24 0701 - 12/25 0700 In: 1689.7 [I.V.:889.7; IV Piggyback:800] Out: 1250 [Urine:1250] Intake/Output this shift: No intake/output data recorded.  Physical exam:  NAD, alert, in good spirits Chest: no use of acc muscles, no resp distress Abd: soft, nt, no peritonitis Ext: Right decubitus covered with a dressing an hour ago. No evidence of necrotizing infection or abscess.  Lab Results: CBC  Recent Labs    07/12/18 1304  WBC 7.7  HGB 10.2*  HCT 32.7*  PLT 271   BMET Recent Labs    07/12/18 1304 07/13/18 0345  NA 137  --   K 4.4  --   CL 104  --   CO2 28  --   GLUCOSE 121*  --   BUN 22  --   CREATININE 0.58* 0.62  CALCIUM 8.4*  --    PT/INR No results for input(s): LABPROT, INR in the last 72 hours. ABG No results for input(s): PHART, HCO3 in the last 72 hours.  Invalid input(s): PCO2, PO2  Studies/Results: Dg Chest Port 1 View  Result Date: 07/13/2018 CLINICAL DATA:  67 year old male with chest pain and shortness of breath for 1 day. EXAM: PORTABLE CHEST 1 VIEW COMPARISON:  Portable chest 05/19/2018 and earlier. FINDINGS: Portable AP upright view at 1109 hours. Chronic lower thoracic spinal stimulator. Lung volumes and mediastinal contours are stable and within normal limits. Visualized tracheal air column is within normal limits. Allowing for portable technique the lungs are clear. No pneumothorax. Stable visualized osseous structures. IMPRESSION: No acute cardiopulmonary abnormality. Electronically Signed   By: Genevie Ann M.D.   On: 07/13/2018  11:20    Anti-infectives: Anti-infectives (From admission, onward)   Start     Dose/Rate Route Frequency Ordered Stop   07/10/18 1315  ceFEPIme (MAXIPIME) 2 g in sodium chloride 0.9 % 100 mL IVPB     2 g 200 mL/hr over 30 Minutes Intravenous Every 12 hours 07/10/18 1314     07/10/18 0000  vancomycin (VANCOCIN) 1,250 mg in sodium chloride 0.9 % 250 mL IVPB     1,250 mg 166.7 mL/hr over 90 Minutes Intravenous Every 12 hours 07/09/18 2030     07/09/18 1715  vancomycin (VANCOCIN) IVPB 1000 mg/200 mL premix     1,000 mg 200 mL/hr over 60 Minutes Intravenous  Once 07/09/18 1714 07/09/18 1914   07/09/18 1715  ceFEPIme (MAXIPIME) 1 g in sodium chloride 0.9 % 100 mL IVPB     1 g 200 mL/hr over 30 Minutes Intravenous  Once 07/09/18 1714 07/09/18 1802      Assessment/Plan:  Right idecubitus ulcer Cardiac w/u negative and currently no evidence of acute coronary sxs NPO after MN Dr. Celine Ahr to debride in am  Caroleen Hamman, MD, FACS  07/14/2018

## 2018-07-14 NOTE — Progress Notes (Signed)
Patient Name: [name] Date of Encounter: 07/14/2018  Hospital Problem List     Active Problems:   Decubitus ulcer of buttock, stage 4 [facility])    Patient Profile     67 year old male with history of decubitus ulcer.  Also had tachycardia and mild chest pain.  Echo revealed preserved LV function with no regional wall motion abnormality.  No significant valvular disease.  EKG showed no ischemia.  Subjective   No cardiac complaints this morning.  Inpatient Medications    . bicalutamide  50 mg Oral Daily  . Chlorhexidine Gluconate Cloth  6 each Topical Q0600  . enoxaparin (LOVENOX) injection  40 mg Subcutaneous Q24H  . feeding supplement (ENSURE ENLIVE)  237 mL Oral TID BM  . ferrous sulfate  325 mg Oral Daily  . gabapentin  800 mg Oral TID  . ketorolac  15 mg Intravenous Q6H  . losartan  100 mg Oral Daily  . methenamine  1 g Oral QID  . mirabegron ER  50 mg Oral Daily  . oxybutynin  10 mg Oral BH-q7a  . polyethylene glycol  17 g Oral Daily  . sodium hypochlorite   Irrigation BID  . vitamin C  1,000 mg Oral TID    Vital Signs    Vitals:   07/13/18 1101 07/13/18 1451 07/13/18 1924 07/14/18 0559  BP: (!) 164/85 (!) 160/86 (!) 154/81 131/84  Pulse: 89 92 90 (!) 58  Resp: 17 18 16 16   Temp: 98.1 F (36.7 C) 98.5 F (36.9 C) 98.3 F (36.8 C) 98.4 F (36.9 C)  TempSrc: Oral Oral Oral Oral  SpO2: 100% 97% 100% 98%  Weight:      Height:        Intake/Output Summary (Last 24 hours) at 07/14/2018 0945 Last data filed at 07/14/2018 0600 Gross per 24 hour  Intake 1689.65 ml  Output 950 ml  Net 739.65 ml   Filed Weights   07/09/18 1502 07/09/18 2134  Weight: 90.7 kg 87.9 kg    Physical Exam    GEN: Well nourished, well developed, in no acute distress.  HEENT: normal.  Neck: Supple, no JVD, carotid bruits, or masses. Cardiac: RRR, no murmurs, rubs, or gallops. No clubbing, cyanosis, edema.  Radials/DP/PT 2+ and equal bilaterally.  Respiratory:   Respirations regular and unlabored, clear to auscultation bilaterally. GI: Soft, nontender, nondistended, BS + x 4. MS: no deformity or atrophy. Skin: warm and dry, no rash. Neuro:  Strength and sensation are intact. Psych: Normal affect.  Labs    CBC Recent Labs    07/12/18 1304  WBC 7.7  HGB 10.2*  HCT 32.7*  MCV 91.6  PLT 440   Basic Metabolic Panel Recent Labs    07/12/18 1304 07/13/18 0345  NA 137  --   K 4.4  --   CL 104  --   CO2 28  --   GLUCOSE 121*  --   BUN 22  --   CREATININE 0.58* 0.62  CALCIUM 8.4*  --    Liver Function Tests No results for input(s): AST, ALT, ALKPHOS, BILITOT, PROT, ALBUMIN in the last 72 hours. No results for input(s): LIPASE, AMYLASE in the last 72 hours. Cardiac Enzymes Recent Labs    07/13/18 1111 07/13/18 1501 07/13/18 1920  TROPONINI <0.03 <0.03 <0.03   BNP No results for input(s): BNP in the last 72 hours. D-Dimer No results for input(s): DDIMER in the last 72 hours. Hemoglobin A1C No results for input(s): HGBA1C  in the last 72 hours. Fasting Lipid Panel No results for input(s): CHOL, HDL, LDLCALC, TRIG, CHOLHDL, LDLDIRECT in the last 72 hours. Thyroid Function Tests No results for input(s): TSH, T4TOTAL, T3FREE, THYROIDAB in the last 72 hours.  Invalid input(s): FREET3  Telemetry    Sinus rhythm/sinus tachycardia.  ECG    Sinus rhythm with no ischemia.  Radiology    Ct Pelvis W Contrast  Result Date: 07/09/2018 CLINICAL DATA:  Rapid progression of decubitus ulcer/abscess. Enlarged lymph nodes. EXAM: CT PELVIS WITH CONTRAST TECHNIQUE: Multidetector CT imaging of the pelvis was performed using the standard protocol following the bolus administration of intravenous contrast. CONTRAST:  19mL ISOVUE-300 IOPAMIDOL (ISOVUE-300) INJECTION 61% COMPARISON:  CT abdomen and pelvis 06/28/2018 FINDINGS: Urinary Tract: Urinary bladder and distal ureters are within normal limits. Bowel:  Visualized small bowel and colon  are unremarkable. Vascular/Lymphatic: Atherosclerotic calcifications are present in the aorta and branch vessels without aneurysm. Reproductive:  No mass or other significant abnormality Other: Decubitus wound over the right greater trochanter has progressed. There is an open wound from the skin surface to the greater trochanter. Gas and soft tissue inflammatory changes are present. The wound measures 4.5 x 3.1 x 6.3 cm. There is gas tracking along the hamstring muscle on the right. Scrotum is unremarkable. No gas or inflammatory changes are present otherwise in the scrotum or perineum. Musculoskeletal: No osseous erosions are present. There is some degenerative change at the right greater trochanter. Sacrum is intact. Degenerative disc disease is present in the lower lumbar spine. Spinal cord stimulator enters at L3-4. IMPRESSION: 1. Progressive ulceration and open wound extending to the greater trochanter as described. 2. Irregularity at the greater trochanter is likely degenerative without definite osteomyelitis. 3. 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 Electronically Signed   By: San Morelle M.D.   On: 07/09/2018 16:32   Ct Abdomen Pelvis W Contrast  Result Date: 06/28/2018 CLINICAL DATA:  Anemia. Decubitus ulcer of right ischium. Weight loss. EXAM: CT ABDOMEN AND PELVIS WITH CONTRAST TECHNIQUE: Multidetector CT imaging of the abdomen and pelvis was performed using the standard protocol following bolus administration of intravenous contrast. CONTRAST:  133mL ISOVUE-300 IOPAMIDOL (ISOVUE-300) INJECTION 61% COMPARISON:  04/07/2017 FINDINGS: Lower chest: Lung bases are clear. No effusions. Heart is normal size. Hepatobiliary: No focal hepatic abnormality. Gallbladder unremarkable. Pancreas: No focal abnormality or ductal dilatation. Spleen: No focal abnormality.  Normal size. Adrenals/Urinary Tract: Small scattered low-density lesions within the left kidney, likely small cysts. No  hydronephrosis. Adrenal glands and urinary bladder unremarkable. Stomach/Bowel: Moderate stool burden throughout the colon. Stomach, large and small bowel grossly unremarkable. Vascular/Lymphatic: Aortic atherosclerosis. No enlarged abdominal or pelvic lymph nodes. Reproductive: No visible focal abnormality. Other: No free fluid or free air. Musculoskeletal: There is subcutaneous soft tissue stranding and possible early abscess formation noted in the right buttock/perineal region extending to the right ischium. Area with rim enhancement and possible early fluid formation with a few locules of gas measures up to 6.7 cm in greatest cross-sectional diameter. No bony changes seen to suggest osteomyelitis. Degenerative changes in the lumbar spine. Spinal stimulator device remains in place, unchanged. IMPRESSION: Right decubitus ulcer with subcutaneous soft tissue stranding in the right buttock and ischial region with possible early abscess formation or phlegmon measuring up to 6.7 cm. Moderate stool burden in the colon. No acute findings in the abdomen or pelvis. Electronically Signed   By: Rolm Baptise M.D.   On: 06/28/2018 10:55   Dg Chest [facility]  1 View  Result Date: 07/13/2018 CLINICAL DATA:  67 year old male with chest pain and shortness of breath for 1 day. EXAM: PORTABLE CHEST 1 VIEW COMPARISON:  Portable chest 05/19/2018 and earlier. FINDINGS: Portable AP upright view at 1109 hours. Chronic lower thoracic spinal stimulator. Lung volumes and mediastinal contours are stable and within normal limits. Visualized tracheal air column is within normal limits. Allowing for portable technique the lungs are clear. No pneumothorax. Stable visualized osseous structures. IMPRESSION: No acute cardiopulmonary abnormality. Electronically Signed   By: Genevie Ann M.D.   On: 07/13/2018 11:20    Assessment & Plan    67 year old male with history of paraplegia secondary to spinal cord injury who was admitted with decubiti.  Had  an episode of tachycardia.  Also had some chest discomfort.  No obvious evidence of ischemia clinically or by electrocardiogram.  Feels better this morning.  Tachycardia-rate somewhat better controlled.  We will continue with current regimen and follow.  Chest pain-no current chest pain.  Decubiti-we will continue with antibiotics and treatment per general surgery.   Signed, Javier Docker Carissa Musick MD 07/14/2018, 9:45 AM  Pager: [phone]

## 2018-07-14 NOTE — Progress Notes (Signed)
[place] [facility] [place] NAME: [name]    MR#:  [id number]  DATE OF BIRTH:  24-Oct-1950  SUBJECTIVE:  CHIEF COMPLAINT: Patient denies any other episodes of chest pain or shortness of breath.  Resting fine.  Seen by cardiology no interventions needed  REVIEW OF SYSTEMS:  CONSTITUTIONAL: No fever, fatigue or weakness.  EYES: No blurred or double vision.  EARS, NOSE, AND THROAT: No tinnitus or ear pain.  RESPIRATORY: No cough, shortness of breath, wheezing or hemoptysis.  CARDIOVASCULAR: No chest pain, orthopnea, edema.  GASTROINTESTINAL: No nausea, vomiting, diarrhea or abdominal pain.  GENITOURINARY: No dysuria, hematuria.  ENDOCRINE: No polyuria, nocturia,  HEMATOLOGY: No anemia, easy bruising or bleeding SKIN: No rash or lesion. MUSCULOSKELETAL: No joint pain or arthritis.   NEUROLOGIC: No tingling, numbness, weakness.  PSYCHIATRY: No anxiety or depression.   DRUG ALLERGIES:   Allergies  Allergen Reactions  . Clindamycin Hives  . Doxycycline Rash  . Penicillins Rash  . Sulfamethoxazole Rash    VITALS:  Blood pressure 131/84, pulse (!) 58, temperature 98.4 F (36.9 C), temperature source Oral, resp. rate 16, height 6\' 1"  (1.854 m), weight 87.9 kg, SpO2 98 %.  PHYSICAL EXAMINATION:  GENERAL:  67 y.o.-year-old patient lying in the bed with no acute distress.  EYES: Pupils equal, round, reactive to light and accommodation. No scleral icterus. Extraocular muscles intact.  HEENT: Head atraumatic, normocephalic. Oropharynx and nasopharynx clear.  NECK:  Supple, no jugular venous distention. No thyroid enlargement, no tenderness.  LUNGS: Normal breath sounds bilaterally, no wheezing, rales,rhonchi or crepitation. No use of accessory muscles of respiration.  CARDIOVASCULAR: S1, S2 normal. No murmurs, rubs, or gallops.  ABDOMEN: Soft, nontender, nondistended. Bowel sounds present. No organomegaly or mass.  EXTREMITIES: No  pedal edema, cyanosis, or clubbing.  NEUROLOGIC: Cranial nerves II through XII are intact. Muscle strength 5/5 in all extremities. Sensation intact. Gait not checked.  PSYCHIATRIC: The patient is alert and oriented x 3.  SKIN: No obvious rash, lesion, or ulcer.    LABORATORY PANEL:   CBC Recent Labs  Lab 07/12/18 1304  WBC 7.7  HGB 10.2*  HCT 32.7*  PLT 271   ------------------------------------------------------------------------------------------------------------------  Chemistries  Recent Labs  Lab 07/09/18 1510  07/12/18 1304 07/13/18 0345  NA 139  --  137  --   K 3.9  --  4.4  --   CL 102  --  104  --   CO2 28  --  28  --   GLUCOSE 112*  --  121*  --   BUN 16  --  22  --   CREATININE 0.51*   < > 0.58* 0.62  CALCIUM 9.1  --  8.4*  --   AST 19  --   --   --   ALT 30  --   --   --   ALKPHOS 50  --   --   --   BILITOT 0.5  --   --   --    < > = values in this interval not displayed.   ------------------------------------------------------------------------------------------------------------------  Cardiac Enzymes Recent Labs  Lab 07/13/18 1920  TROPONINI <0.03   ------------------------------------------------------------------------------------------------------------------  RADIOLOGY:  Dg Chest Port 1 View  Result Date: 07/13/2018 CLINICAL DATA:  67 year old male with chest pain and shortness of breath for 1 day. EXAM: PORTABLE CHEST 1 VIEW COMPARISON:  Portable chest 05/19/2018 and earlier. FINDINGS: Portable AP upright view at 1109 hours. Chronic lower thoracic  spinal stimulator. Lung volumes and mediastinal contours are stable and within normal limits. Visualized tracheal air column is within normal limits. Allowing for portable technique the lungs are clear. No pneumothorax. Stable visualized osseous structures. IMPRESSION: No acute cardiopulmonary abnormality. Electronically Signed   By: Genevie Ann M.D.   On: 07/13/2018 11:20    EKG:   Orders placed or  performed during the hospital encounter of 07/09/18  . EKG 12-Lead  . EKG 12-Lead  . EKG 12-Lead  . EKG 12-Lead  . EKG 12-Lead  . EKG 12-Lead    ASSESSMENT AND PLAN:    #Chest pain atypical Patient is asymptomatic chest pain resolved Acute MI ruled out with negative troponins  EKG nonspecific ST-T changes Echocardiogram ordered-preliminary report with normal ejection fraction 55 to 60% and no significant valvular abnormalities no abnormalities Cardiology consult placed to Dr. Ubaldo Glassing, no urgent cardiac interventions needed at this time Sublingual nitroglycerin as needed  #Hypertension-continue current medications Cozaar and titrate as needed  #Decubitus ulcer nonhealing-right ischial area For surgical debridement, care per attending physician Continue IV antibiotics cefepime and vancomycin  We will sign off  All the records are reviewed and case discussed with Care Management/Social Workerr. Management plans discussed with the patient, he is in agreement   [place] THIS PATIENT: 32 minutes.   .  Note: This dictation was prepared with Dragon dictation along with smaller phrase technology. Any transcriptional errors that result from this process are unintentional.   Nicholes Mango M.D on 07/14/2018 at 2:03 PM  Between 7am to 6pm - Pager - [phone] After 6pm go to [email or web address] - password [email or web address] [facility]  Office  [phone]  CC: Primary care physician; [name], MD

## 2018-07-15 ENCOUNTER — Encounter
Admission: EM | Disposition: A | Discharge status: Skilled Nursing Facility | Payer: Self-pay | Source: Home / Self Care | Attending: General Surgery

## 2018-07-15 ENCOUNTER — Encounter: Payer: Self-pay | Admitting: General Surgery

## 2018-07-15 ENCOUNTER — Inpatient Hospital Stay: Payer: Medicare Other | Admitting: Anesthesiology

## 2018-07-15 DIAGNOSIS — D649 Anemia, unspecified: Secondary | ICD-10-CM

## 2018-07-15 HISTORY — PX: SKIN DEBRIDEMENT: SHX5235

## 2018-07-15 LAB — CREATININE, SERUM
Creatinine, Ser: 0.56 mg/dL — ABNORMAL LOW (ref 0.61–1.24)
GFR calc Af Amer: >60 mL/min (ref >60)
GFR calc non Af Amer: >60 mL/min (ref >60)

## 2018-07-15 LAB — VANCOMYCIN, TROUGH: Vancomycin Tr: 18 ug/mL (ref 15–20)

## 2018-07-15 SURGERY — DEBRIDEMENT, SKIN, FULL-THICKNESS
Anesthesia: General | Laterality: Right

## 2018-07-15 MED ORDER — BUPIVACAINE HCL (PF) 0.5 % IJ SOLN
INTRAMUSCULAR | Status: AC
  Filled 2018-07-15: qty 30

## 2018-07-15 MED ORDER — MIDAZOLAM HCL 2 MG/2ML IJ SOLN
INTRAMUSCULAR | Status: DC | PRN
  Administered 2018-07-15: 2 mg via INTRAVENOUS

## 2018-07-15 MED ORDER — LACTATED RINGERS IV SOLN
INTRAVENOUS | Status: DC | PRN
  Administered 2018-07-15: 08:00:00 via INTRAVENOUS

## 2018-07-15 MED ORDER — FENTANYL CITRATE (PF) 100 MCG/2ML IJ SOLN
INTRAMUSCULAR | Status: AC
  Filled 2018-07-15: qty 2

## 2018-07-15 MED ORDER — LIDOCAINE HCL (CARDIAC) PF 100 MG/5ML IV SOSY
PREFILLED_SYRINGE | INTRAVENOUS | Status: DC | PRN
  Administered 2018-07-15: 60 mg via INTRAVENOUS

## 2018-07-15 MED ORDER — ONDANSETRON HCL 4 MG/2ML IJ SOLN
INTRAMUSCULAR | Status: AC
  Filled 2018-07-15: qty 2

## 2018-07-15 MED ORDER — DEXAMETHASONE SODIUM PHOSPHATE 10 MG/ML IJ SOLN
INTRAMUSCULAR | Status: DC | PRN
  Administered 2018-07-15: 5 mg via INTRAVENOUS

## 2018-07-15 MED ORDER — PHENYLEPHRINE HCL 10 MG/ML IJ SOLN
INTRAMUSCULAR | Status: DC | PRN
  Administered 2018-07-15: 100 ug via INTRAVENOUS

## 2018-07-15 MED ORDER — HYDROMORPHONE HCL 1 MG/ML IJ SOLN
0.2500 mg | INTRAMUSCULAR | Status: DC | PRN
  Administered 2018-07-15 (×3): 0.25 mg via INTRAVENOUS

## 2018-07-15 MED ORDER — PROPOFOL 10 MG/ML IV BOLUS
INTRAVENOUS | Status: AC
  Filled 2018-07-15: qty 20

## 2018-07-15 MED ORDER — VITAMIN C 500 MG PO TABS
250.0000 mg | ORAL_TABLET | Freq: Three times a day (TID) | ORAL | Status: DC
  Administered 2018-07-15 – 2018-07-20 (×15): 250 mg via ORAL
  Filled 2018-07-15 (×16): qty 1

## 2018-07-15 MED ORDER — FENTANYL CITRATE (PF) 100 MCG/2ML IJ SOLN
25.0000 ug | INTRAMUSCULAR | Status: AC | PRN
  Administered 2018-07-15 (×6): 25 ug via INTRAVENOUS

## 2018-07-15 MED ORDER — DEXAMETHASONE SODIUM PHOSPHATE 10 MG/ML IJ SOLN
INTRAMUSCULAR | Status: AC
  Filled 2018-07-15: qty 1

## 2018-07-15 MED ORDER — HYDROMORPHONE HCL 1 MG/ML IJ SOLN
INTRAMUSCULAR | Status: AC
  Administered 2018-07-15: 0.25 mg via INTRAVENOUS
  Filled 2018-07-15: qty 1

## 2018-07-15 MED ORDER — EPHEDRINE SULFATE 50 MG/ML IJ SOLN
INTRAMUSCULAR | Status: AC
  Filled 2018-07-15: qty 1

## 2018-07-15 MED ORDER — PHENYLEPHRINE HCL 10 MG/ML IJ SOLN
INTRAMUSCULAR | Status: AC
  Filled 2018-07-15: qty 1

## 2018-07-15 MED ORDER — FAMOTIDINE 20 MG PO TABS
20.0000 mg | ORAL_TABLET | Freq: Two times a day (BID) | ORAL | Status: DC
  Administered 2018-07-15 – 2018-07-20 (×10): 20 mg via ORAL
  Filled 2018-07-15 (×10): qty 1

## 2018-07-15 MED ORDER — BUPIVACAINE-EPINEPHRINE (PF) 0.25% -1:200000 IJ SOLN
INTRAMUSCULAR | Status: AC
  Filled 2018-07-15: qty 30

## 2018-07-15 MED ORDER — OMEGA-3-ACID ETHYL ESTERS 1 G PO CAPS
1.0000 g | ORAL_CAPSULE | Freq: Every day | ORAL | Status: DC
  Administered 2018-07-15 – 2018-07-19 (×5): 1 g via ORAL
  Filled 2018-07-15 (×5): qty 1

## 2018-07-15 MED ORDER — ACETAMINOPHEN 10 MG/ML IV SOLN
INTRAVENOUS | Status: DC | PRN
  Administered 2018-07-15: 1000 mg via INTRAVENOUS

## 2018-07-15 MED ORDER — PROPOFOL 10 MG/ML IV BOLUS
INTRAVENOUS | Status: DC | PRN
  Administered 2018-07-15: 130 mg via INTRAVENOUS

## 2018-07-15 MED ORDER — DAKINS (1/2 STRENGTH) 0.25 % EX SOLN
Freq: Once | CUTANEOUS | Status: AC
  Administered 2018-07-15: 1
  Filled 2018-07-15: qty 473

## 2018-07-15 MED ORDER — MIDAZOLAM HCL 2 MG/2ML IJ SOLN
INTRAMUSCULAR | Status: AC
  Filled 2018-07-15: qty 2

## 2018-07-15 MED ORDER — ONDANSETRON HCL 4 MG/2ML IJ SOLN: 4.0000 mg | Freq: Once | INTRAMUSCULAR | Status: DC | PRN

## 2018-07-15 MED ORDER — DAKINS (1/2 STRENGTH) 0.25 % EX SOLN
Freq: Two times a day (BID) | CUTANEOUS | Status: DC
  Administered 2018-07-15: 20:00:00

## 2018-07-15 MED ORDER — CHLORHEXIDINE GLUCONATE CLOTH 2 % EX PADS
6.0000 | MEDICATED_PAD | Freq: Once | CUTANEOUS | Status: DC
  Administered 2018-07-14: 6 via TOPICAL

## 2018-07-15 MED ORDER — SUCCINYLCHOLINE CHLORIDE 20 MG/ML IJ SOLN
INTRAMUSCULAR | Status: DC | PRN
  Administered 2018-07-15: 100 mg via INTRAVENOUS

## 2018-07-15 MED ORDER — EPHEDRINE SULFATE 50 MG/ML IJ SOLN
INTRAMUSCULAR | Status: DC | PRN
  Administered 2018-07-15 (×2): 10 mg via INTRAVENOUS

## 2018-07-15 MED ORDER — FENTANYL CITRATE (PF) 100 MCG/2ML IJ SOLN
INTRAMUSCULAR | Status: AC
  Administered 2018-07-15: 25 ug via INTRAVENOUS
  Filled 2018-07-15: qty 2

## 2018-07-15 MED ORDER — JUVEN PO PACK
1.0000 | PACK | Freq: Two times a day (BID) | ORAL | Status: DC
  Administered 2018-07-16 – 2018-07-20 (×8): 1 via ORAL

## 2018-07-15 MED ORDER — ONDANSETRON HCL 4 MG/2ML IJ SOLN
INTRAMUSCULAR | Status: DC | PRN
  Administered 2018-07-15: 4 mg via INTRAVENOUS

## 2018-07-15 MED ORDER — OCUVITE-LUTEIN PO CAPS
1.0000 | ORAL_CAPSULE | Freq: Every day | ORAL | Status: DC
  Administered 2018-07-16 – 2018-07-20 (×5): 1 via ORAL
  Filled 2018-07-15 (×5): qty 1

## 2018-07-15 MED ORDER — CHLORHEXIDINE GLUCONATE CLOTH 2 % EX PADS
6.0000 | MEDICATED_PAD | Freq: Once | CUTANEOUS | Status: DC
  Administered 2018-07-15: 6 via TOPICAL

## 2018-07-15 MED ORDER — LIDOCAINE HCL (PF) 2 % IJ SOLN
INTRAMUSCULAR | Status: AC
  Filled 2018-07-15: qty 10

## 2018-07-15 MED ORDER — FENTANYL CITRATE (PF) 100 MCG/2ML IJ SOLN
INTRAMUSCULAR | Status: DC | PRN
  Administered 2018-07-15: 50 ug via INTRAVENOUS
  Administered 2018-07-15: 100 ug via INTRAVENOUS

## 2018-07-15 MED ORDER — ACETAMINOPHEN 10 MG/ML IV SOLN
INTRAVENOUS | Status: AC
  Filled 2018-07-15: qty 100

## 2018-07-15 MED ORDER — VANCOMYCIN HCL 1000 MG IV SOLR
INTRAVENOUS | Status: AC
  Filled 2018-07-15: qty 1000

## 2018-07-15 MED ORDER — SUCCINYLCHOLINE CHLORIDE 20 MG/ML IJ SOLN
INTRAMUSCULAR | Status: AC
  Filled 2018-07-15: qty 1

## 2018-07-15 SURGICAL SUPPLY — 27 items
BLADE SURG 15 STRL LF DISP TIS (BLADE) IMPLANT
BLADE SURG 15 STRL SS (BLADE) ×2
BLADE SURG SZ11 CARB STEEL (BLADE) ×1 IMPLANT
BNDG CONFORM 3 STRL LF (GAUZE/BANDAGES/DRESSINGS) ×2 IMPLANT
BRUSH SCRUB EZ  4% CHG (MISCELLANEOUS) ×1
BRUSH SCRUB EZ 4% CHG (MISCELLANEOUS) IMPLANT
CANISTER SUCT 1200ML W/VALVE (MISCELLANEOUS) ×1 IMPLANT
CNTNR SPEC 2.5X3XGRAD LEK (MISCELLANEOUS) ×3
CONT SPEC 4OZ STER OR WHT (MISCELLANEOUS) ×3
CONT SPEC 4OZ STRL OR WHT (MISCELLANEOUS) ×3
CONTAINER SPEC 2.5X3XGRAD LEK (MISCELLANEOUS) IMPLANT
DRAPE LAPAROTOMY 77X122 PED (DRAPES) ×1 IMPLANT
DRESSING ALLEVYN LIFE SACRUM (GAUZE/BANDAGES/DRESSINGS) ×1 IMPLANT
ELECT CAUTERY BLADE 6.4 (BLADE) ×1 IMPLANT
ELECT REM PT RETURN 9FT ADLT (ELECTROSURGICAL) ×2
ELECTRODE REM PT RTRN 9FT ADLT (ELECTROSURGICAL) IMPLANT
GLOVE BIO SURGEON STRL SZ 6.5 (GLOVE) ×4 IMPLANT
GLOVE INDICATOR 7.0 STRL GRN (GLOVE) ×1 IMPLANT
GOWN STRL REUS W/ TWL LRG LVL3 (GOWN DISPOSABLE) IMPLANT
GOWN STRL REUS W/TWL LRG LVL3 (GOWN DISPOSABLE) ×6
KIT TURNOVER KIT A (KITS) ×1 IMPLANT
NEEDLE HYPO 22GX1.5 SAFETY (NEEDLE) ×1 IMPLANT
NS IRRIG 1000ML POUR BTL (IV SOLUTION) ×1 IMPLANT
PACK BASIN MINOR ARMC (MISCELLANEOUS) ×1 IMPLANT
PAD ABD DERMACEA PRESS 5X9 (GAUZE/BANDAGES/DRESSINGS) ×1 IMPLANT
SWAB DUAL CULTURE TRANS RED ST (MISCELLANEOUS) ×1 IMPLANT
SYR 20CC LL (SYRINGE) ×1 IMPLANT

## 2018-07-15 NOTE — Transfer of Care (Signed)
Immediate Anesthesia Transfer of Care Note  Patient: [name]  Procedure(s) Performed: DEBRIDEMENT SKIN FULL THICKNESS--stage 4 decubitus ulcer (Right )  Patient Location: PACU  Anesthesia Type:General  Level of Consciousness: drowsy and patient cooperative  Airway & Oxygen Therapy: Patient Spontanous Breathing and Patient connected to face mask oxygen  Post-op Assessment: Report given to RN and Post -op Vital signs reviewed and stable  Post vital signs: Reviewed and stable  Last Vitals:  Vitals Value Taken Time  BP 111/62 07/15/2018  8:59 AM  Temp    Pulse 85 07/15/2018  9:00 AM  Resp 7 07/15/2018  9:00 AM  SpO2 100 % 07/15/2018  9:00 AM  Vitals shown include unvalidated device data.  Last Pain:  Vitals:   07/15/18 0550  TempSrc: Oral  PainSc:          Complications: No apparent anesthesia complications

## 2018-07-15 NOTE — Progress Notes (Addendum)
Initial Nutrition Assessment  DOCUMENTATION CODES:   Not applicable  INTERVENTION:   Ensure Enlive po TID, each supplement provides 350 kcal and 20 grams of protein (Pt may also have Premier Protein if he prefers)  [employer] Punch BID, each serving provides 95kcal and 2.5g of protein (amino acids glutamine and arginine)  Ocuvite daily for wound healing (provides zinc, vitamin A, vitamin C, Vitamin E, copper, and selenium)  Pt ordered for vitamin C 1000mg  TID; only about 200-300mg  of vitamin C are absorbed at one time. With high doses of vitamin C > 1g absorption is decreased by ~50%. Will change to 250mg  TID.   Omega 3 fatty acid daily   NUTRITION DIAGNOSIS:   Increased nutrient needs related to wound healing as evidenced by increased estimated needs.  GOAL:   Patient will meet greater than or equal to 90% of their needs  MONITOR:   PO intake, Supplement acceptance, Labs, Weight trends, Skin, I & O's  REASON FOR ASSESSMENT:   Consult Wound healing  ASSESSMENT:   67 y/o male with a history of hypertension, prostate cancer, with history of paraplegia secondary to spinal cord injury was admitted to surgical service for nonhealing decubitus ulcer s/p I & D 12/26   RD familiar with this pt from recent previous admit. Pt with fairly good appetite and oral intake at baseline. Pt ordered for Ensure, pt was drinking Premier during last admit. Pt may have whatever supplement he prefers. RD will add supplements and vitamins to support wound healing. Pt NPO today for I & D. Per chart, pt with 42lb(18%) wt loss over the past year which is not significant for the time frame but worth noting. Pt needs to continue vitamins and supplements after discharge.   Medications reviewed and include: lovenox, pepcid, ferrous sulfate, miralax, vitamin C, cefepime, LRS @50ml /hr, vancomycin, hydromorphone, oxycodone    Labs reviewed: creat 0.56(L) Prealbumin 15.6(L)- 12/23 Hgb 10.2(L), Hct  32.7(L)  NUTRITION - FOCUSED PHYSICAL EXAM:    Most Recent Value  Orbital Region  No depletion  Upper Arm Region  No depletion  Thoracic and Lumbar Region  No depletion  Buccal Region  No depletion  Temple Region  No depletion  Clavicle Bone Region  No depletion  Clavicle and Acromion Bone Region  No depletion  Scapular Bone Region  No depletion  Dorsal Hand  No depletion  Patellar Region  No depletion  Anterior Thigh Region  No depletion  Posterior Calf Region  No depletion  Edema (RD Assessment)  Mild  Hair  Reviewed  Eyes  Reviewed  Mouth  Reviewed  Skin  Reviewed  Nails  Reviewed     Diet Order:   Diet Order            Diet regular Room service appropriate? Yes; Fluid consistency: Thin  Diet effective now             EDUCATION NEEDS:   Education needs have been addressed  Skin:  Skin Assessment: Reviewed RN Assessment(Stage IV right buttocks ( 7 cm x 7 cm x 7 cm ))  Last BM:  PTA  Height:   Ht Readings from Last 1 Encounters:  07/09/18 6\' 1"  (1.854 m)    Weight:   Wt Readings from Last 1 Encounters:  07/09/18 87.9 kg    Ideal Body Weight:  83.6 kg  BMI:  Body mass index is 25.57 kg/m.  Estimated Nutritional Needs:   Kcal:  2300-2600kcal/day   Protein:  124-140g/day  Fluid:  >2.1L/day   Koleen Distance MS, RD, LDN Pager #- [phone] Office#- [phone] After Hours Pager: [phone]

## 2018-07-15 NOTE — Anesthesia Postprocedure Evaluation (Signed)
Anesthesia Post Note  Patient: [name]  Procedure(s) Performed: DEBRIDEMENT SKIN FULL THICKNESS--stage 4 decubitus ulcer (Right )  Patient location during evaluation: PACU Anesthesia Type: General Level of consciousness: awake and alert Pain management: pain level controlled Vital Signs Assessment: post-procedure vital signs reviewed and stable Respiratory status: spontaneous breathing and respiratory function stable Cardiovascular status: stable Anesthetic complications: no     Last Vitals:  Vitals:   07/15/18 1025 07/15/18 1102  BP: 136/78 127/81  Pulse: 77 77  Resp: 18 18  Temp: (!) 36.3 C 36.9 C  SpO2: 98% 98%    Last Pain:  Vitals:   07/15/18 1102  TempSrc: Oral  PainSc:                  KEPHART,WILLIAM K

## 2018-07-15 NOTE — Brief Op Note (Signed)
07/15/2018  8:54 AM  PATIENT:  [name]  67 y.o. male  PRE-OPERATIVE DIAGNOSIS:  stage 4 decubitus ulcer  POST-OPERATIVE DIAGNOSIS:  stage 4 decubitus ulcer  PROCEDURE:  Procedure(s): DEBRIDEMENT SKIN FULL THICKNESS--stage 4 decubitus ulcer (Right)  SURGEON:  Surgeon(s) and Role:    Fredirick Maudlin, MD - Primary  PHYSICIAN ASSISTANT:   ASSISTANTS: none   ANESTHESIA:   general  EBL:  <5 cc  BLOOD ADMINISTERED:none  DRAINS: none   LOCAL MEDICATIONS USED:  NONE  SPECIMEN:  Source of Specimen:  bone biopsy for histopathology and culture; deep soft tissue for culture  DISPOSITION OF SPECIMEN:  pathology and microbiology  COUNTS:  YES  TOURNIQUET:  * No tourniquets in log *  DICTATION: .Note written in EPIC  PLAN OF CARE: continue inpatient care  PATIENT DISPOSITION:  PACU - hemodynamically stable.   Delay start of Pharmacological VTE agent (>24hrs) due to surgical blood loss or risk of bleeding: no

## 2018-07-15 NOTE — Progress Notes (Addendum)
Day of Surgery   Subjective/Chief Complaint: Patient seen at bedside in pre-op holding. No further chest pain events. Ready to proceed with I&D with biopsies.   Objective: Vital signs in last 24 hours: Temp:  [97.2 F (36.2 C)-98.3 F (36.8 C)] 97.2 F (36.2 C) (12/26 0859) Pulse Rate:  [63-90] 90 (12/26 0929) Resp:  [8-20] 17 (12/26 0929) BP: (111-161)/(62-86) 129/75 (12/26 0929) SpO2:  [95 %-100 %] 100 % (12/26 0929)    Intake/Output from previous day: 12/25 0701 - 12/26 0700 In: 676.6 [I.V.:376.6; IV Piggyback:300] Out: 1950 [Urine:1950] Intake/Output this shift: Total I/O In: 1050 [I.V.:1050] Out: -   General appearance: alert, cooperative and no distress Resp: clear to auscultation bilaterally Cardio: regular rate and rhythm Incision/Wound: dressing in place with brown-green drainage; will defer full exam to OR  Lab Results:  Recent Labs    07/12/18 1304  WBC 7.7  HGB 10.2*  HCT 32.7*  PLT 271   BMET Recent Labs    07/12/18 1304 07/13/18 0345 07/15/18 0432  NA 137  --   --   K 4.4  --   --   CL 104  --   --   CO2 28  --   --   GLUCOSE 121*  --   --   BUN 22  --   --   CREATININE 0.58* 0.62 0.56*  CALCIUM 8.4*  --   --    PT/INR No results for input(s): LABPROT, INR in the last 72 hours. ABG No results for input(s): PHART, HCO3 in the last 72 hours.  Invalid input(s): PCO2, PO2  Studies/Results: Dg Chest Port 1 View  Result Date: 07/13/2018 CLINICAL DATA:  67 year old male with chest pain and shortness of breath for 1 day. EXAM: PORTABLE CHEST 1 VIEW COMPARISON:  Portable chest 05/19/2018 and earlier. FINDINGS: Portable AP upright view at 1109 hours. Chronic lower thoracic spinal stimulator. Lung volumes and mediastinal contours are stable and within normal limits. Visualized tracheal air column is within normal limits. Allowing for portable technique the lungs are clear. No pneumothorax. Stable visualized osseous structures. IMPRESSION: No acute  cardiopulmonary abnormality. Electronically Signed   By: Genevie Ann M.D.   On: 07/13/2018 11:20    Anti-infectives: Anti-infectives (From admission, onward)   Start     Dose/Rate Route Frequency Ordered Stop   07/10/18 1315  [MAR Hold]  ceFEPIme (MAXIPIME) 2 g in sodium chloride 0.9 % 100 mL IVPB     (MAR Hold since Thu 07/15/2018 at 0737. Reason: Transfer to a Procedural area.)   2 g 200 mL/hr over 30 Minutes Intravenous Every 12 hours 07/10/18 1314     07/10/18 0000  [MAR Hold]  vancomycin (VANCOCIN) 1,250 mg in sodium chloride 0.9 % 250 mL IVPB     (MAR Hold since Thu 07/15/2018 at 0737. Reason: Transfer to a Procedural area.)   1,250 mg 166.7 mL/hr over 90 Minutes Intravenous Every 12 hours 07/09/18 2030     07/09/18 1715  vancomycin (VANCOCIN) IVPB 1000 mg/200 mL premix     1,000 mg 200 mL/hr over 60 Minutes Intravenous  Once 07/09/18 1714 07/09/18 1914   07/09/18 1715  ceFEPIme (MAXIPIME) 1 g in sodium chloride 0.9 % 100 mL IVPB     1 g 200 mL/hr over 30 Minutes Intravenous  Once 07/09/18 1714 07/09/18 1802      Assessment/Plan: Proceed to OR for  Procedure(s): DEBRIDEMENT SKIN FULL THICKNESS--stage 4 decubitus ulcer (Right) Continue ABX therapy due to concern for osteomyelitis;  biopsy pending Continue dressing changes BID with Dakin's solution Continue protein supplementation due to low pre-albumin Likely d/c home tomorrow or Saturday.  LOS: 6 days    Fredirick Maudlin 07/15/2018

## 2018-07-15 NOTE — Consult Note (Signed)
Pharmacy Antibiotic Note  [name] is a 67 y.o. male admitted on 07/09/2018 with cellulitis/possible osteomyelitis.  Pharmacy has been consulted for vancomycin dosing. He has been a patient here at [name] recently and received IV vancomycin. I have used that data to determine the current dosing regimen. 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    Will continue current regimen and f/u Scr. Recheck in 3-5 days depending on renal fxn.     Height: 6\' 1"  (185.4 cm) Weight: 193 lb 12.6 oz (87.9 kg) IBW/kg (Calculated) : 79.9  Temp (24hrs), Avg:97.7 F (36.5 C), Min:97 F (36.1 C), Max:98.4 F (36.9 C)  Recent Labs  Lab 07/09/18 1510 07/11/18 0508 07/11/18 1141 07/12/18 1304 07/13/18 0345 07/15/18 0432 07/15/18 1132  WBC 9.1  --   --  7.7  --   --   --   CREATININE 0.51* 0.68  --  0.58* 0.62 0.56*  --   VANCOTROUGH  --   --  17  --   --   --  18    Estimated Creatinine Clearance: 102.6 mL/min (A) (by C-G formula based on SCr of 0.56 mg/dL (L)).    Allergies  Allergen Reactions  . Clindamycin Hives  . Doxycycline Rash  . Penicillins Rash  . Sulfamethoxazole Rash    Antimicrobials this admission: vancomycin 12/20 >>  Cefepime 12/20 >>  Microbiology results: None currently ordered or pending  Thank you for allowing pharmacy to be a part of this patient's care.  Lu Duffel, PharmD, BCPS Clinical Pharmacist 07/15/2018 1:14 PM

## 2018-07-15 NOTE — Anesthesia Preprocedure Evaluation (Signed)
Anesthesia Evaluation  Patient identified by MRN, date of birth, ID band Patient awake    Reviewed: Allergy & Precautions, NPO status , Patient's Chart, lab work & pertinent test results  History of Anesthesia Complications Negative for: history of anesthetic complications  Airway Mallampati: III       Dental   Pulmonary neg sleep apnea, neg COPD,           Cardiovascular hypertension, Pt. on medications (-) Past MI and (-) CHF (-) dysrhythmias (-) Valvular Problems/Murmurs     Neuro/Psych neg Seizures L2 parapelegic  Neuromuscular disease    GI/Hepatic Neg liver ROS, neg GERD  ,  Endo/Other  neg diabetes  Renal/GU negative Renal ROS     Musculoskeletal   Abdominal   Peds  Hematology   Anesthesia Other Findings   Reproductive/Obstetrics                             Anesthesia Physical Anesthesia Plan  ASA: III  Anesthesia Plan: General   Post-op Pain Management:    Induction: Intravenous  PONV Risk Score and Plan: 2 and Dexamethasone and Ondansetron  Airway Management Planned: Oral ETT  Additional Equipment:   Intra-op Plan:   Post-operative Plan:   Informed Consent: I have reviewed the patients History and Physical, chart, labs and discussed the procedure including the risks, benefits and alternatives for the proposed anesthesia with the patient or authorized representative who has indicated his/her understanding and acceptance.     Plan Discussed with:   Anesthesia Plan Comments:         Anesthesia Quick Evaluation

## 2018-07-15 NOTE — Care Management (Addendum)
Patient's wound debrided today. Surgeon has provided wound measurements and ordered low air pressure mattress.  It could take up to 10 days to obtain auth for the mattress after auth has started. Discussed the possibility of home IV antibiotics.  Patient and wife agreeable, willing and capable of learning how to administer IV antibiotics. Notified [facility] that have information and order  for the low pressure mattress, the possible need for IV antibiotics.  Patient has a wound vac at home that was using prior to this admission. He does not have a wound vac in place s/p debridement today.  Patient does not have a PICC line.  Awaiting cultures which will impact need for home IV antibiotics.

## 2018-07-15 NOTE — Progress Notes (Signed)
Date of Admission:  07/09/2018     ID: COHEN BOETTNER is a 67 y.o. male   Active Problems:   Decubitus ulcer of buttock, stage 4 (HCC)  07/15/18 Irrigation and debridement of full thickness skin ulcer, bone biopsy, soft tissue biopsy;   Subjective: Doing okay No fever or chills   Medications:  . bicalutamide  50 mg Oral Daily  . Chlorhexidine Gluconate Cloth  6 each Topical Q0600  . enoxaparin (LOVENOX) injection  40 mg Subcutaneous Q24H  . feeding supplement (ENSURE ENLIVE)  237 mL Oral TID BM  . ferrous sulfate  325 mg Oral Daily  . gabapentin  800 mg Oral TID  . losartan  100 mg Oral Daily  . methenamine  1 g Oral QID  . mirabegron ER  50 mg Oral Daily  . oxybutynin  10 mg Oral BH-q7a  . polyethylene glycol  17 g Oral Daily  . sodium hypochlorite   Irrigation BID  . vitamin C  1,000 mg Oral TID    Objective: Vital signs in last 24 hours: Temp:  [97 F (36.1 C)-98.4 F (36.9 C)] 98.4 F (36.9 C) (12/26 1102) Pulse Rate:  [63-90] 77 (12/26 1102) Resp:  [8-20] 18 (12/26 1102) BP: (111-161)/(62-88) 127/81 (12/26 1102) SpO2:  [95 %-100 %] 98 % (12/26 1102)  PHYSICAL EXAM:  General: Alert, cooperative, no distress, appears stated age.  Head: Normocephalic, without obvious abnormality, atraumatic. Eyes: Conjunctivae clear, anicteric sclerae. Pupils are equal ENT Nares normal. No drainage or sinus tenderness. Lips, mucosa, and tongue normal. No Thrush Neck: Supple, symmetrical, no adenopathy, thyroid: non tender no carotid bruit and no JVD. Back: No CVA tenderness. Lungs: Clear to auscultation bilaterally. No Wheezing or Rhonchi. No rales. Heart: Regular rate and rhythm, no murmur, rub or gallop. Abdomen: Soft, non-tender,not distended. Bowel sounds normal. No masses Extremities:surgeical site not examined Skin: No rashes or lesions. Or bruising Lymph: Cervical, supraclavicular normal. Neurologic: weakness lower limbs Lab Results Recent Labs     07/12/18 1304 07/13/18 0345 07/15/18 0432  WBC 7.7  --   --   HGB 10.2*  --   --   HCT 32.7*  --   --   NA 137  --   --   K 4.4  --   --   CL 104  --   --   CO2 28  --   --   BUN 22  --   --   CREATININE 0.58* 0.62 0.56*   Liver Panel No results for input(s): PROT, ALBUMIN, AST, ALT, ALKPHOS, BILITOT, BILIDIR, IBILI in the last 72 hours. Sedimentation Rate No results for input(s): ESRSEDRATE in the last 72 hours. C-Reactive Protein No results for input(s): CRP in the last 72 hours.  Microbiology:  Studies/Results: No results found.   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 ?Rt ischial/gluteal fold pressure ulcer- - tunneling Stage IV- had Irrigation and debridement of full thickness skin ulcer, bone biopsy, soft tissue biopsy- cultures sent   Continue vanco and cefepime currently. Depending on the results ( pathology and culture) will decide on duration of antibiotics   Paraparesis following L1 AVM embolization  Neurogenic bladder- CIC  Neurogenic bowel-   Chronic rectal and leg pain- has baclofen pump and spine stimulator  Anemia -  ______________________________________________ Discussed with patient,and is wife in detail

## 2018-07-15 NOTE — Op Note (Addendum)
Operative Note  Preoperative Diagnosis:  Stage 4 decubitus ulcer  Postoperative Diagnosis:  same  Operation:  Irrigation and debridement of full thickness skin ulcer, bone biopsy, soft tissue biopsy; total wound area 49 sq cm.  Surgeon: Fredirick Maudlin, MD  Assistant: none  Anesthesia: GETA  Findings: The wound was found to tunnel approximately 7 cm cranially from the open area on the skin surface.  The packing removed had purulent discharge.  The wound bed itself showed good granulation tissue and no frank purulence.  The bone was not visible in the wound bed, however the overlying tendons were.  Indications: The patient is a 67 year old man who is paraplegic.  He developed an ischial pressure sore in May 2019.  This is been treated conservatively, but has progressed.  He presented to the emergency department after his home health nurse noted that it was significantly worse.  A CT scan demonstrated progression of the ulcer compared to one performed just 11 days earlier.  There was concern for potential bone involvement.  It was felt that he would benefit from debridement of his ulcer and bone biopsy to guide potential long-term antibiotic care.  Procedure In Detail: The patient was identified in the preoperative holding area.  The risks and anticipated benefits of the procedure were discussed with him and his family, who were present.  All questions were answered to their satisfaction and they agreed to proceed.  The patient was brought to the operating room and intubated on his stretcher.  He was then turned into the prone position.  Care was taken to pad all bony prominences.  His spinal cord stimulator was protected from causing any pressure on his abdominal skin.  Chest rolls were in place and the scrotum and penis were free from any pressure.  The table was flexed and he was sterilely prepped and draped in standard fashion.  A timeout was performed confirming his identity the procedure  being performed all necessary equipment was available and that maintenance anesthesia was in use.  I began by sharply incising the tissue overlying the skin track.  There was good bleeding tissue present.  I carried the incision down to the cavity.  The skin was then reflected laterally and the wound explored.  There was good granulation tissue present.  The initial tuberosity was palpable in the base of the wound.  There was still some coverage with tendon, but no muscle or other tissue overlying the bone was identified.  All surfaces were curetted and found to bleed freely.  A rongeur was used to take bone and tissue biopsies.  These were sent to pathology and microbiology respectively.  The wound was then pulse lavaged with 1 L of saline to which 1 g of vancomycin had been added.  The wound was then packed with Dakin's solution-moistened gauze.  A sterile dressing was applied.  The patient was then turned supine awakened extubated and taken to the postanesthesia care unit in good condition.  EBL: <5cc  IVF: See anesthesia record  Specimen(s): 1. Bone biopsy for histopathology   2. Bone biopsy for culture   3. Deep soft tissue for culture  Complications: none immediately apparent.   Counts: all needles, instruments, and sponges were counted and reported to be correct in number at the end of the case.   I was present for and participated in the entire operation.  Fredirick Maudlin 07/15/18  8:56 AM

## 2018-07-15 NOTE — Anesthesia Procedure Notes (Signed)
Procedure Name: Intubation Date/Time: 07/15/2018 7:51 AM Performed by: Jonna Clark, CRNA Pre-anesthesia Checklist: Patient identified, Patient being monitored, Timeout performed, Emergency Drugs available and Suction available Patient Re-evaluated:Patient Re-evaluated prior to induction Oxygen Delivery Method: Circle system utilized Preoxygenation: Pre-oxygenation with 100% oxygen Induction Type: IV induction Ventilation: Mask ventilation without difficulty Laryngoscope Size: Miller and 2 Grade View: Grade II Tube type: Oral Tube size: 7.5 mm Number of attempts: 1 Placement Confirmation: ETT inserted through vocal cords under direct vision,  positive ETCO2 and breath sounds checked- equal and bilateral Secured at: 22 cm Tube secured with: Tape Dental Injury: Teeth and Oropharynx as per pre-operative assessment

## 2018-07-15 NOTE — Plan of Care (Signed)
Documentation for home health DME/wound care:  Today, I debrided [name] stage 4 pressure wound.  There is bone palpable at the base, which was biopsied to evaluate for osteomyelitis.  The total size of the wound is 7 cm x 7 cm x 7 cm.  Tissue cultures were also taken, with results pending.    I believe that [name] wound has progressed since May due to a number of factors, including (but not limited to): less aggressive wound care than this type of wound requires, possible osteomyelitis, poor protein nutrition status (prealbumin of 15), and inability to properly off-load the wound site.  He will benefit from rectifying all of these factors and will hopefully begin to show signs of wound healing.

## 2018-07-15 NOTE — Anesthesia Post-op Follow-up Note (Signed)
Anesthesia QCDR form completed.        

## 2018-07-16 LAB — CREATININE, SERUM
Creatinine, Ser: 0.72 mg/dL (ref 0.61–1.24)
GFR calc Af Amer: >60 mL/min (ref >60)
GFR calc non Af Amer: >60 mL/min (ref >60)

## 2018-07-16 LAB — SURGICAL PATHOLOGY

## 2018-07-16 MED ORDER — SILVER NITRATE-POT NITRATE 75-25 % EX MISC
10.0000 | CUTANEOUS | Status: DC | PRN
  Administered 2018-07-16: 10 via TOPICAL
  Filled 2018-07-16 (×2): qty 10

## 2018-07-16 NOTE — Progress Notes (Signed)
   Date of Admission:  07/09/2018     ID: [name] is a 67 y.o. male   Active Problems:   Decubitus ulcer of buttock, stage 4 (HCC)  07/15/18 Irrigation and debridement of full thickness skin ulcer, bone biopsy, soft tissue biopsy;     Subjective:  Pt seen with Dr.Cannon  Doing okay No fever or chills No pain at the site of the surgery   Medications:  . bicalutamide  50 mg Oral Daily  . Chlorhexidine Gluconate Cloth  6 each Topical Q0600  . enoxaparin (LOVENOX) injection  40 mg Subcutaneous Q24H  . famotidine  20 mg Oral BID  . feeding supplement (ENSURE ENLIVE)  237 mL Oral TID BM  . ferrous sulfate  325 mg Oral Daily  . gabapentin  800 mg Oral TID  . losartan  100 mg Oral Daily  . methenamine  1 g Oral QID  . mirabegron ER  50 mg Oral Daily  . multivitamin-lutein  1 capsule Oral Daily  . nutrition supplement (JUVEN)  1 packet Oral BID BM  . omega-3 acid ethyl esters  1 g Oral Daily  . oxybutynin  10 mg Oral BH-q7a  . polyethylene glycol  17 g Oral Daily  . vitamin C  250 mg Oral TID    Objective: Vital signs in last 24 hours: Temp:  [97.7 F (36.5 C)-98.3 F (36.8 C)] 97.7 F (36.5 C) (12/27 0610) Pulse Rate:  [61-74] 61 (12/27 0610) Resp:  [16] 16 (12/26 1925) BP: (112-120)/(69-71) 120/71 (12/27 0610) SpO2:  [95 %-99 %] 99 % (12/27 0610)  PHYSICAL EXAM:  General: Alert, cooperative, no distress, appears stated age.  Head: Normocephalic, without obvious abnormality, atraumatic. Eyes: Conjunctivae clear, anicteric sclerae. Pupils are equal ENT Nares normal. No drainage or sinus tenderness. Lips, mucosa, and tongue normal. No Thrush Neck: Supple, symmetrical, no adenopathy, thyroid: non tender no carotid bruit and no JVD. Back: No CVA tenderness. Lungs: Clear to auscultation bilaterally. No Wheezing or Rhonchi. No rales. Heart: Regular rate and rhythm, no murmur, rub or gallop. Abdomen: Soft, non-tender,not distended. Bowel sounds normal. No  masses Extremities:surgical dressing wt- oozing from the edge of the wound- red healthy tissue    Skin: No rashes or lesions. Or bruising Lymph: Cervical, supraclavicular normal. Neurologic: weakness lower limbs Lab Results Recent Labs    07/15/18 0432 07/16/18 0422  CREATININE 0.56* 0.72   Liver Panel No results for input(s): PROT, ALBUMIN, AST, ALT, ALKPHOS, BILITOT, BILIDIR, IBILI in the last 72 hours. Sedimentation Rate No results for input(s): ESRSEDRATE in the last 72 hours. C-Reactive Protein No results for input(s): CRP in the last 72 hours.  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 ?Rt ischial/gluteal fold pressure ulcer- - tunneling Stage IV- had Irrigation and debridement of full thickness skin ulcer, bone biopsy, soft tissue biopsy- cultures sent   Continue vanco and cefepime currently. Depending on the results ( pathology and culture) will decide on route and duration of antibiotics  Oozing treated with Silver nitrate and stitch by Dr.Cannon wound vac applied  Paraparesis following L1 AVM embolization  Neurogenic bladder- CIC  Neurogenic bowel-   Chronic rectal and leg pain- has baclofen pump and spine stimulator  Anemia -  ______________________________________________ Pt seen with Dr.Cannon

## 2018-07-16 NOTE — Progress Notes (Signed)
1 Day Post-Op   Subjective/Chief Complaint: No acute events overnight.  Seen this morning with Dr. Delaine Lame (ID).  No issues with pain control s/p debridement. Gram stain of bone and tissue negative; cultures and pathology pending.   Objective: Vital signs in last 24 hours: Temp:  [97.7 F (36.5 C)-98.4 F (36.9 C)] 97.7 F (36.5 C) (12/27 0610) Pulse Rate:  [61-77] 61 (12/27 0610) Resp:  [16-18] 16 (12/26 1925) BP: (112-127)/(69-81) 120/71 (12/27 0610) SpO2:  [95 %-99 %] 99 % (12/27 0610)    Intake/Output from previous day: 12/26 0701 - 12/27 0700 In: 2487.6 [P.O.:240; I.V.:1597.6; IV Piggyback:650] Out: 1700 [Urine:1700] Intake/Output this shift: Total I/O In: 984.8 [I.V.:684.6; IV Piggyback:300.2] Out: -   General appearance: alert and no distress Head: Normocephalic, without obvious abnormality, atraumatic Resp: clear to auscultation bilaterally GI: soft, non-tender; bowel sounds normal; no masses,  no organomegaly.  Spinal cord stimulator in RLQ. Extremities: extremities normal, atraumatic, no cyanosis or edema Skin: Skin color, texture, turgor normal. No rashes or lesions.  Skin on lower back/buttocks/upper legs wet due to drainage from dressing.  Incision/Wound: dressing saturated with Dakin's solution and a small amount of blood.  Oozing from skin edges and what appears to be a small cutaneous vessel.  Lab Results:  No results for input(s): WBC, HGB, HCT, PLT in the last 72 hours. BMET Recent Labs    07/15/18 0432 07/16/18 0422  CREATININE 0.56* 0.72   PT/INR No results for input(s): LABPROT, INR in the last 72 hours. ABG No results for input(s): PHART, HCO3 in the last 72 hours.  Invalid input(s): PCO2, PO2  Studies/Results: No results found.  Anti-infectives: Anti-infectives (From admission, onward)   Start     Dose/Rate Route Frequency Ordered Stop   07/10/18 1315  ceFEPIme (MAXIPIME) 2 g in sodium chloride 0.9 % 100 mL IVPB     2 g 200 mL/hr  over 30 Minutes Intravenous Every 12 hours 07/10/18 1314     07/10/18 0000  vancomycin (VANCOCIN) 1,250 mg in sodium chloride 0.9 % 250 mL IVPB     1,250 mg 166.7 mL/hr over 90 Minutes Intravenous Every 12 hours 07/09/18 2030     07/09/18 1715  vancomycin (VANCOCIN) IVPB 1000 mg/200 mL premix     1,000 mg 200 mL/hr over 60 Minutes Intravenous  Once 07/09/18 1714 07/09/18 1914   07/09/18 1715  ceFEPIme (MAXIPIME) 1 g in sodium chloride 0.9 % 100 mL IVPB     1 g 200 mL/hr over 30 Minutes Intravenous  Once 07/09/18 1714 07/09/18 1802      Assessment/Plan: s/p Procedure(s): DEBRIDEMENT SKIN FULL THICKNESS--stage 4 decubitus ulcer (Right)  Due to excessive moisture on lower back/buttocks/legs from dressing, I decided to place a wound VAC.  Oozing treated with 3 figure-of-8 3-0 vicryl stiches and silver nitrate sticks.  White foam placed in wound base overlying area of bone biopsy. Oval piece of black granufoam placed on top. Total wound area 7 x 7 x 7 cm.  Good suction achieved without leaking at 125 mm Hg.  Padding placed between tubing and patient's skin.  Will plan next VAC change on Monday.  Plan to keep patient through the weekend and arrange home VAC. Awaiting results from cultures and histopathology to plan course of antibiotics. Continue current regimen for now. RD made recommendations for supplements to augment wound healing; ordered.  Case management working on home DME low air pressure mattress.   LOS: 7 days    [name] 07/16/2018

## 2018-07-16 NOTE — Care Management Important Message (Signed)
Important Message  Patient Details  Name: [name] MRN: [id number] Date of Birth: 08-13-1950   Medicare Important Message Given:  Yes    Rhealyn Cullen A Charlette Hennings, RN 07/16/2018, 3:53 PM

## 2018-07-17 MED ORDER — SODIUM CHLORIDE 0.9 % IV SOLN
INTRAVENOUS | Status: DC | PRN
  Administered 2018-07-17 – 2018-07-19 (×3): 1000 mL via INTRAVENOUS

## 2018-07-17 MED ORDER — SODIUM CHLORIDE 0.9 % IV SOLN
2.0000 g | INTRAVENOUS | Status: DC
  Administered 2018-07-17 – 2018-07-19 (×3): 2 g via INTRAVENOUS
  Filled 2018-07-17 (×3): qty 2
  Filled 2018-07-17: qty 20

## 2018-07-17 NOTE — Progress Notes (Signed)
07/17/2018  Subjective: Patient is 2 Days Post-Op s/p sacral decub debridement.  No acute events.  Wound vac working well.  Vital signs: Temp:  [98.6 F (37 C)] 98.6 F (37 C) (12/28 0458) Pulse Rate:  [62-70] 70 (12/28 0458) Resp:  [14-20] 14 (12/28 0458) BP: (113-118)/(62-78) 118/78 (12/28 0458) SpO2:  [95 %-99 %] 99 % (12/28 0458)   Intake/Output: 12/27 0701 - 12/28 0700 In: 2480.2 [I.V.:1379.9; IV Piggyback:1100.2] Out: 1825 [Urine:1825] Last BM Date: 07/16/18  Physical Exam: Constitutional: No acute distress Skin:  Wound vac in place to suction with good seal.  Skin edges without erythema.  Labs:  No results for input(s): WBC, HGB, HCT, PLT in the last 72 hours. Recent Labs    07/15/18 0432 07/16/18 0422  CREATININE 0.56* 0.72   No results for input(s): LABPROT, INR in the last 72 hours.  Imaging: No results found.  Assessment/Plan: This is a 67 y.o. male s/p sacral decubitus wound debridement  --continue wound vac today and will plan for wound vac change on Monday. --continue regular diet, offloading rotations --continue IV antibiotics, cultures currently pending but no growth so far.   Melvyn Neth, [facility]

## 2018-07-18 NOTE — Progress Notes (Signed)
07/18/2018 7:55 PM  Supply chain does not carry the self catheters that the patient requires. Patient has been providing his on supplies from home since his admittance 07-09-2018. Patient starting to run low on supplies as he is self cathing every 1 to 2 hours rather then every 4 to 5 hours. Per patient [facility] will not be delivering new supplies till possibly tomorrow. Spoke with Seth Bake the triage nurse with [facility] regarding needed self cath supplies. Unfortunately they cannot authorize additional supplies as he is in the hospital and it is the responsibility of [facility] to supply the catheters. Ordered urological cart to possibly use some of the catheters. Patients wife did bring all that they had at home today which are left at the bedside. Patient also wanted to stress the need for the proper bed in place prior to discharge.   Fuller Mandril, RN

## 2018-07-18 NOTE — Progress Notes (Signed)
[place] Hospital Day(s): 9.   Post op day(s): 3 Days Post-Op.   Interval History: Patient seen and examined, no acute events or new complaints overnight. Patient reports doing OK. Felt discomfort on the [facility] system site but now is good.  Vital signs in last 24 hours: [min-max] current  Temp:  [100.1 F (37.8 C)] 100.1 F (37.8 C) (12/28 1947) Pulse Rate:  [96] 96 (12/28 1947) BP: (141)/(77) 141/77 (12/28 1947) SpO2:  [97 %] 97 % (12/28 1947)     Height: 6\' 1"  (185.4 cm) Weight: 87.9 kg BMI (Calculated): 25.57    Physical Exam:  Constitutional: alert, cooperative and no distress  Back: Wound VAC in place with good suction. Skin looks healthy, no ischemic tissue or purulent secretion.   Labs:  CBC Latest Ref Rng & Units 07/12/2018 07/09/2018 05/21/2018  WBC 4.0 - 10.5 K/uL 7.7 9.1 10.9(H)  Hemoglobin 13.0 - 17.0 g/dL 10.2(L) 10.9(L) 12.0(L)  Hematocrit 39.0 - 52.0 % 32.7(L) 34.7(L) 35.8(L)  Platelets 150 - 400 K/uL 271 350 209   CMP Latest Ref Rng & Units 07/16/2018 07/15/2018 07/13/2018  Glucose 70 - 99 mg/dL - - -  BUN 8 - 23 mg/dL - - -  Creatinine 0.61 - 1.24 mg/dL 0.72 0.56(L) 0.62  Sodium 135 - 145 mmol/L - - -  Potassium 3.5 - 5.1 mmol/L - - -  Chloride 98 - 111 mmol/L - - -  CO2 22 - 32 mmol/L - - -  Calcium 8.9 - 10.3 mg/dL - - -  Total Protein 6.5 - 8.1 g/dL - - -  Total Bilirubin 0.3 - 1.2 mg/dL - - -  Alkaline Phos 38 - 126 U/L - - -  AST 15 - 41 U/L - - -  ALT 0 - 44 U/L - - -    Imaging studies: No new pertinent imaging studies   Assessment/Plan:  67 y.o. male with decubitus ulcer 3 Days Post-Op s/p debridement and wound VAC system placement. Recovering well, tolerating wound VAC therapy. Will need exchange tomorrow. Will continue same therapy.  Patient is able to turn and move on sides.   Arnold Long, MD

## 2018-07-19 ENCOUNTER — Inpatient Hospital Stay: Payer: Self-pay

## 2018-07-19 DIAGNOSIS — M8668 Other chronic osteomyelitis, other site: Secondary | ICD-10-CM

## 2018-07-19 DIAGNOSIS — B9562 Methicillin resistant Staphylococcus aureus infection as the cause of diseases classified elsewhere: Secondary | ICD-10-CM

## 2018-07-19 DIAGNOSIS — R32 Unspecified urinary incontinence: Secondary | ICD-10-CM

## 2018-07-19 DIAGNOSIS — N3289 Other specified disorders of bladder: Secondary | ICD-10-CM

## 2018-07-19 LAB — URINALYSIS, ROUTINE W REFLEX MICROSCOPIC
Bilirubin Urine: NEGATIVE
Glucose, UA: NEGATIVE mg/dL
Hgb urine dipstick: NEGATIVE
Ketones, ur: NEGATIVE mg/dL
Leukocytes, UA: NEGATIVE
Nitrite: NEGATIVE
Protein, ur: NEGATIVE mg/dL
Specific Gravity, Urine: 1.013 (ref 1.005–1.030)
pH: 7 (ref 5.0–8.0)

## 2018-07-19 LAB — CREATININE, SERUM
Creatinine, Ser: 0.6 mg/dL — ABNORMAL LOW (ref 0.61–1.24)
GFR calc Af Amer: >60 mL/min (ref >60)
GFR calc non Af Amer: >60 mL/min (ref >60)

## 2018-07-19 MED ORDER — SODIUM CHLORIDE 0.9% FLUSH: 10.0000 mL | INTRAVENOUS | Status: DC | PRN

## 2018-07-19 MED ORDER — SODIUM CHLORIDE 0.9% FLUSH
10.0000 mL | Freq: Two times a day (BID) | INTRAVENOUS | Status: DC
  Administered 2018-07-19 – 2018-07-20 (×2): 10 mL

## 2018-07-19 NOTE — Progress Notes (Addendum)
[place] [facility] Day(s): 10.   Post op day(s): 4 Days Post-Op.   Interval History: Patient seen and examined, no acute events or new complaints overnight. Wound vac working well. Due for change today.   Review of Systems:  Constitutional: denies fever, chills  Integumentary: Decubitus Ulcer  Vital signs in last 24 hours: [min-max] current  Temp:  [98.6 F (37 C)-98.7 F (37.1 C)] 98.7 F (37.1 C) (12/30 0500) Pulse Rate:  [71-87] 71 (12/30 0500) Resp:  [16-18] 18 (12/30 0500) BP: (120-136)/(65-78) 136/78 (12/30 0500) SpO2:  [94 %-95 %] 95 % (12/30 0500)     Height: 6\' 1"  (185.4 cm) Weight: 87.9 kg BMI (Calculated): 25.57   Intake/Output this shift:  Total I/O In: -  Out: 400 [Urine:400]    Physical Exam:  Constitutional: alert, cooperative and no distress  Respiratory: breathing non-labored at rest  Integumentary: 7 x 7 cm wound to right upper buttock with underlying bone exposure. Wound is pink and moist with bleeding when manipulated. Wound vac changed at bedside.   Labs:  CBC Latest Ref Rng & Units 07/12/2018 07/09/2018 05/21/2018  WBC 4.0 - 10.5 K/uL 7.7 9.1 10.9(H)  Hemoglobin 13.0 - 17.0 g/dL 10.2(L) 10.9(L) 12.0(L)  Hematocrit 39.0 - 52.0 % 32.7(L) 34.7(L) 35.8(L)  Platelets 150 - 400 K/uL 271 350 209   CMP Latest Ref Rng & Units 07/16/2018 07/15/2018 07/13/2018  Glucose 70 - 99 mg/dL - - -  BUN 8 - 23 mg/dL - - -  Creatinine 0.61 - 1.24 mg/dL 0.72 0.56(L) 0.62  Sodium 135 - 145 mmol/L - - -  Potassium 3.5 - 5.1 mmol/L - - -  Chloride 98 - 111 mmol/L - - -  CO2 22 - 32 mmol/L - - -  Calcium 8.9 - 10.3 mg/dL - - -  Total Protein 6.5 - 8.1 g/dL - - -  Total Bilirubin 0.3 - 1.2 mg/dL - - -  Alkaline Phos 38 - 126 U/L - - -  AST 15 - 41 U/L - - -  ALT 0 - 44 U/L - - -    Assessment/Plan:  67 y.o. male who is doing well 4 Days Post-Op s/p irrigation and debridement for stage 4 right decubitus ulcer, complicated by  pertinent comorbidities including paraplegia.   - Wound vac changed at bedside (white hydrophilic foam placed over bone biopsy site and up into tunnel, black granufoam placed to skin edges)  - Continue IV antibiotics, will discuss with ID for possible POC/ABx at discharge   - Continue frequent positional changes to offload pressure  - Follow up cultures   - Discharge planning: will discuss with CSW.    All of the above findings and recommendations were discussed with the patient, and the medical team, and all of patient's questions were answered to his expressed satisfaction.  -- Edison Simon, [name] [facility] 07/19/2018, 8:37 AM [phone] M-F: 7am - 4pm   I saw and evaluated the patient.  I agree with the above documentation, exam, and plan, which I have edited where appropriate. Fredirick Maudlin  9:10 AM

## 2018-07-19 NOTE — Progress Notes (Signed)
Date of Admission:  07/09/2018    ID: [name] is a 67 y.o. male  Active Problems:   Decubitus ulcer of buttock, stage 4 (HCC)  07/15/18 Irrigation and debridement of full thickness skin ulcer, bone biopsy, soft tissue biopsy;   Subjective: C/o bladder spasms Says he has been doing CIC every 2 hrs. Also having incontinence  Medications:  . bicalutamide  50 mg Oral Daily  . Chlorhexidine Gluconate Cloth  6 each Topical Q0600  . enoxaparin (LOVENOX) injection  40 mg Subcutaneous Q24H  . famotidine  20 mg Oral BID  . feeding supplement (ENSURE ENLIVE)  237 mL Oral TID BM  . ferrous sulfate  325 mg Oral Daily  . gabapentin  800 mg Oral TID  . losartan  100 mg Oral Daily  . methenamine  1 g Oral QID  . mirabegron ER  50 mg Oral Daily  . multivitamin-lutein  1 capsule Oral Daily  . nutrition supplement (JUVEN)  1 packet Oral BID BM  . omega-3 acid ethyl esters  1 g Oral Daily  . oxybutynin  10 mg Oral BH-q7a  . polyethylene glycol  17 g Oral Daily  . sodium chloride flush  10-40 mL Intracatheter Q12H  . vitamin C  250 mg Oral TID    Objective: Vital signs in last 24 hours: Temp:  [98.7 F (37.1 C)] 98.7 F (37.1 C) (12/30 0500) Pulse Rate:  [71] 71 (12/30 0500) Resp:  [18] 18 (12/30 0500) BP: (136)/(78) 136/78 (12/30 0500) SpO2:  [95 %] 95 % (12/30 0500)  PHYSICAL EXAM:  General: Alert, cooperative, no distress, appears stated age.  Head: Normocephalic, without obvious abnormality, atraumatic. Eyes: Conjunctivae clear, anicteric sclerae. Pupils are equal ENT Nares normal. No drainage or sinus tenderness. Lips, mucosa, and tongue normal. No Thrush Neck: Supple, symmetrical, no adenopathy, thyroid: non tender no carotid bruit and no JVD. Back:wound vac  Lungs: Clear to auscultation bilaterally. No Wheezing or Rhonchi. No rales. Heart: Regular rate and rhythm, no murmur, rub or gallop. Abdomen: Soft, non-tender,not distended. Bowel sounds normal. No  masses Extremities: atraumatic, no cyanosis. No edema. No clubbing Skin: No rashes or lesions. Or bruising Lymph: Cervical, supraclavicular normal. Neurologic:paraperesis  Lab Results Recent Labs    07/19/18 1148  CREATININE 0.60*   Liver Panel No results for input(s): PROT, ALBUMIN, AST, ALT, ALKPHOS, BILITOT, BILIDIR, IBILI in the last 72 hours. Sedimentation Rate No results for input(s): ESRSEDRATE in the last 72 hours. C-Reactive Protein No results for input(s): CRP in the last 72 hours.  Microbiology:  Studies/Results: [place] Ekg Site Rite  Result Date: 07/19/2018 If Site Rite image not attached, placement could not be confirmed due to current cardiac rhythm.    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 ?Rt ischial/gluteal fold pressure ulcer- - tunneling Stage IV- had Irrigation and debridement of full thickness skin ulcer, bone biopsy, soft tissue biopsy- cultures sent   pathology shows chronic osteomyelitis Culture MRSA He will get 6 weeks of IV vancomycin Orders will be placed once the vanco trough is obtained today   Paraparesis following L1 AVM embolization  Neurogenic bladder- CIC  Neurogenic bowel- c/o bladder spasm and need for frequent catheterization- check UA/culture Chronic rectal and leg pain- has baclofen pump and spine stimulator  Anemia - ______________________________________________ Discussed the management with the patient and the primary team

## 2018-07-19 NOTE — Progress Notes (Signed)
Peripherally Inserted Central Catheter/Midline Placement  The IV Nurse has discussed with the patient and/or persons authorized to consent for the patient, the purpose of this procedure and the potential benefits and risks involved with this procedure.  The benefits include less needle sticks, lab draws from the catheter, and the patient may be discharged home with the catheter. Risks include, but not limited to, infection, bleeding, blood clot (thrombus formation), and puncture of an artery; nerve damage and irregular heartbeat and possibility to perform a PICC exchange if needed/ordered by physician.  Alternatives to this procedure were also discussed.  Bard Power PICC patient education guide, fact sheet on infection prevention and patient information card has been provided to patient /or left at bedside.    PICC/Midline Placement Documentation        Darlyn Read 07/19/2018, 4:49 PM

## 2018-07-20 ENCOUNTER — Ambulatory Visit: Payer: Medicare Other | Admitting: Infectious Diseases

## 2018-07-20 LAB — AEROBIC/ANAEROBIC CULTURE W GRAM STAIN (SURGICAL/DEEP WOUND)
Culture: NO GROWTH
Gram Stain: NONE SEEN

## 2018-07-20 LAB — URINE CULTURE: Culture: NO GROWTH

## 2018-07-20 LAB — AEROBIC/ANAEROBIC CULTURE (SURGICAL/DEEP WOUND): GRAM STAIN: NONE SEEN

## 2018-07-20 LAB — BASIC METABOLIC PANEL
Anion gap: 10 (ref 5–15)
BUN: 22 mg/dL (ref 8–23)
CO2: 27 mmol/L (ref 22–32)
Calcium: 9.1 mg/dL (ref 8.9–10.3)
Chloride: 102 mmol/L (ref 98–111)
Creatinine, Ser: 0.67 mg/dL (ref 0.61–1.24)
GFR calc Af Amer: >60 mL/min (ref >60)
GFR calc non Af Amer: >60 mL/min (ref >60)
Glucose, Bld: 97 mg/dL (ref 70–99)
Potassium: 4.1 mmol/L (ref 3.5–5.1)
SODIUM: 139 mmol/L (ref 135–145)

## 2018-07-20 LAB — VANCOMYCIN, TROUGH: Vancomycin Tr: 17 ug/mL (ref 15–20)

## 2018-07-20 MED ORDER — VANCOMYCIN IV (FOR PTA / DISCHARGE USE ONLY): 1250.0000 mg | Freq: Two times a day (BID) | INTRAVENOUS | 0 refills | Status: AC

## 2018-07-20 MED ORDER — SODIUM CHLORIDE 0.9 % IV SOLN
2.0000 g | INTRAVENOUS | Status: DC
  Administered 2018-07-20: 2 g via INTRAVENOUS
  Filled 2018-07-20: qty 2
  Filled 2018-07-20: qty 20

## 2018-07-20 MED ORDER — CEFTRIAXONE IV (FOR PTA / DISCHARGE USE ONLY): 2.0000 g | INTRAVENOUS | 0 refills | Status: AC

## 2018-07-20 MED ORDER — OXYCODONE HCL 5 MG PO TABS: 5.0000 mg | ORAL_TABLET | Freq: Four times a day (QID) | ORAL | 0 refills | Status: DC | PRN

## 2018-07-20 MED ORDER — GABAPENTIN 800 MG PO TABS: 800.0000 mg | ORAL_TABLET | Freq: Three times a day (TID) | ORAL | 0 refills | Status: AC

## 2018-07-20 NOTE — Progress Notes (Signed)
[name]  A and O x 4. VSS. Pt tolerating diet well. No complaints of pain or nausea, prescriptions given. Pt voiced understanding of discharge instructions with no further questions. Pt discharged to [facility] healthcare with picc line via EMS.   Allergies as of 07/20/2018      Reactions   Clindamycin Hives   Doxycycline Rash   Penicillins Rash   Sulfamethoxazole Rash      Medication List    TAKE these medications   acetaminophen 500 MG tablet Commonly known as:  TYLENOL Take 1,000 mg by mouth 2 (two) times daily.   baclofen 10 MG tablet Commonly known as:  LIORESAL Take 10 mg by mouth daily as needed for muscle spasms.   bicalutamide 50 MG tablet Commonly known as:  CASODEX Take 50 mg by mouth daily.   cefTRIAXone  IVPB Commonly known as:  ROCEPHIN Inject 2 g into the vein daily for 28 days. Indication:  Osteomyelitis of ischial bone Last Day of Therapy:  08/18/2018 Labs - Twice weekly on Mondays and Thursdays:  BMP, CBC, LFT, ESR Please pull PIC at completion of IV antibiotics Start taking on:  July 21, 2018   collagenase ointment Commonly known as:  SANTYL Apply topically daily.   gabapentin 800 MG tablet Commonly known as:  NEURONTIN Take 1 tablet (800 mg total) by mouth 3 (three) times daily.   Iron 325 (65 Fe) MG Tabs Take 325 mg by mouth daily.   losartan 100 MG tablet Commonly known as:  COZAAR Take 100 mg by mouth daily.   methenamine 1 g tablet Commonly known as:  HIPREX Take 1 g by mouth 3 (three) times daily.   mirabegron ER 50 MG Tb24 tablet Commonly known as:  MYRBETRIQ Take 50 mg by mouth daily.   nutrition supplement (JUVEN) Pack Take 1 packet by mouth 2 (two) times daily between meals.   oxybutynin 10 MG 24 hr tablet Commonly known as:  DITROPAN-XL Take 10 mg by mouth every morning.   oxyCODONE 5 MG immediate release tablet Commonly known as:  Oxy IR/ROXICODONE Take 1-2 tablets (5-10 mg total) by mouth every 6 (six) hours  as needed for breakthrough pain.   PAIN MANAGEMENT INTRATHECAL (IT) PUMP 1 each by Intrathecal route continuous. Intrathecal (IT) medication:  Baclofen, Dilaudid   polyethylene glycol powder powder Commonly known as:  GLYCOLAX/MIRALAX Take 17 g by mouth daily.   SYSTANE BALANCE 0.6 % Soln Generic drug:  Propylene Glycol Place 1 drop into both eyes daily as needed (dry eyes).   vancomycin  IVPB Inject 1,250 mg into the vein every 12 (twelve) hours for 28 days. Indication:  Osteomyelitis of ischial bone Last Day of Therapy:  08/18/2018 Labs - Twice weekly on Mondays and Thursdays:  BMP(baseline SCr 0.6), CBC, LFT, ESR  Labs - Once weekly on Mondays:  Vancomycin trough (goal 15-20 mcg/mL) Please pull PIC at completion of IV antibiotics Start taking on:  July 21, 2018   vitamin C 500 MG tablet Commonly known as:  ASCORBIC ACID Take 1,000 mg by mouth 3 (three) times daily.            Home Infusion Instuctions  (From admission, onward)         Start     Ordered   07/20/18 0000  Home infusion instructions [facility] May follow [place] Dosing Protocol; May administer Cathflo as needed to maintain patency of vascular access device.; Flushing of vascular access device: per [facility] Protocol: 0.9%  NaCl pre/post medica...    Question Answer Comment  Instructions May follow [place] Dosing Protocol   Instructions May administer Cathflo as needed to maintain patency of vascular access device.   Instructions Flushing of vascular access device: per [facility] Protocol: 0.9% NaCl pre/post medication administration and prn patency; Heparin 100 u/ml, 51m for implanted ports and Heparin 10u/ml, 572mfor all other central venous catheters.   Instructions May follow [facility] Anaphylaxis Protocol for First Dose Administration in the home: 0.9% NaCl at 25-50 ml/hr to maintain IV access for protocol meds. Epinephrine 0.3 ml IV/IM PRN and Benadryl 25-50 IV/IM PRN s/s of anaphylaxis.   Instructions  [facility] Infusion Coordinator (RN) to assist per patient IV care needs in the home PRN.      07/20/18 11[place](From admission, onward)         Start     Ordered   07/15/18 1607  For home use only DME Specialty mattress  Once    Comments:  Low air pressure mattress   07/15/18 1607          Vitals:   07/20/18 0958 07/20/18 1212  BP: (!) 141/73 129/69  Pulse: 78 86  Resp:  16  Temp:  98.5 F (36.9 C)  SpO2:  96%    JuFrancesco [name]

## 2018-07-20 NOTE — Treatment Plan (Signed)
Diagnosis: Osteomyelitis of ischial bone Baseline Creatinine 0.6 Last trough 18 from 07/19/18 on 1250mg  IV q 12  Culture Result: MRSA  Allergies  Allergen Reactions  . Clindamycin Hives  . Doxycycline Rash  . Penicillins Rash  . Sulfamethoxazole Rash    OPAT Orders Discharge antibiotics:  Vancomycin 1250mg  IV Every 12 hours until 08/18/18 Per pharmacy protocol ( adjust dose accordingly) Aim for Vancomycin trough 15-20   Ceftriaxone 2 grams IV every 24 hours until 08/18/18  [facility] Care Per Protocol:  Labs twice a week  while on IV antibiotics: Monday and Thursday  _X_ BMP X__ Vancomycin trough Labs once a week on Monday  _X_ CBC with differential _X LFT X__ ESR_   Please pull PIC at completion of IV antibiotics   Fax weekly labs to [phone]  Clinic Follow Up Appt with Dr.Maynard David in 3 weeks:  Call [phone] to make appt

## 2018-07-20 NOTE — Clinical Social Work Note (Signed)
Clinical Social Work Assessment  Patient Details  Name: [name] MRN: [id number] Date of Birth: [birth year]  Date of referral:  07/20/18               Reason for consult:  Facility Placement                Permission sought to share information with:  Case Manager, Family Supports, Chartered certified accountant granted to share information::  Yes, Verbal Permission Granted  Name::        Agency::     Relationship::     Contact Information:     Housing/Transportation Living arrangements for the past 2 months:  Single Family Home Source of Information:  Patient, Spouse, Adult Children Patient Interpreter Needed:  None Criminal Activity/Legal Involvement Pertinent to Current Situation/Hospitalization:  No - Comment as needed Significant Relationships:  Adult Children, Spouse Lives with:  Spouse Do you feel safe going back to the place where you live?  Yes Need for family participation in patient care:  Yes (Comment)  Care giving concerns:  Patient resides at home with his wife at baseline.   Social Worker assessment / plan:  CSW notified today that patient and family changed their minds and want to pursue rehab. Today is the day of discharge. CSW gave patient and family the medicare star rating list and informed them of the placement process. Patient's only offer was from [facility] and the patient and family wish to pursue this today. Kelly at [facility] can have the wound vac today and state they have the catheter size patient needs. Patient to transport via EMS. Discharge information sent.  Employment status:  Disabled (Comment on whether or not currently receiving Disability) Insurance information:  Medicare PT Recommendations:  Home with [place] / Referral to community resources:     Patient/Family's Response to care:  Patient and family expressed appreciation for CSW assistance.  Patient/Family's Understanding of and Emotional  Response to Diagnosis, Current Treatment, and Prognosis:  Patient and family feel more secure with patient going to a facility rather than returning home at this time.   Emotional Assessment Appearance:  Appears stated age Attitude/Demeanor/Rapport:  (pleasant and cooperative) Affect (typically observed):  Accepting, Calm Orientation:  Oriented to Self, Oriented to Place, Oriented to Situation, Oriented to  Time Alcohol / Substance use:  Not Applicable Psych involvement (Current and /or in the community):  No (Comment)  Discharge Needs  Concerns to be addressed:  Care Coordination Readmission within the last 30 days:  No Current discharge risk:  None Barriers to Discharge:  No Barriers Identified   Shela Leff, LCSW 07/20/2018, 3:09 PM

## 2018-07-20 NOTE — Care Management (Signed)
Late Entry:  RNCM met with patient, wife, daughter, and Leroy Sea with [facility] 07/19/18.  Plan was for patient to discharge home on 12/31.  [facility] RN was in the process of arranging delivery of home catheters, wife was to bring in [place] from home, home IV antibiotics were discusses, PICC line placed, and patient and family were updated on the status of the low air loss mattress.   On 07/20/18 Patient and family decided to purse SNF placement.  Ultimately the patient discharge to [facility].  Brad with [facility] notified of disposition

## 2018-07-20 NOTE — Progress Notes (Signed)
Nutrition Follow Up Note   DOCUMENTATION CODES:   Not applicable  INTERVENTION:   Ensure Enlive po TID, each supplement provides 350 kcal and 20 grams of protein (Pt may also have Premier Protein if he prefers)  [employer] Punch BID, each serving provides 95kcal and 2.5g of protein (amino acids glutamine and arginine)  Ocuvite daily for wound healing (provides zinc, vitamin A, vitamin C, Vitamin E, copper, and selenium)  Vitamin C 250mg  TID.   Omega 3 fatty acid daily   NUTRITION DIAGNOSIS:   Increased nutrient needs related to wound healing as evidenced by increased estimated needs.  GOAL:   Patient will meet greater than or equal to 90% of their needs  -progressing   MONITOR:   PO intake, Supplement acceptance, Labs, Weight trends, Skin, I & O's  ASSESSMENT:   67 y/o male with a history of hypertension, prostate cancer, with history of paraplegia secondary to spinal cord injury was admitted to surgical service for nonhealing decubitus ulcer s/p I & D 12/26. Pt now with MRSA, osteomyelitis and VAC   Pt with varying amounts of oral intake; eating 25-100% of meals. Pt is drinking most of his Ensure and Juven supplements. No new weight since admit; will request weekly weights. Wound VAC in place with 48ml output x 24 hrs. Recommend continue vitamins and supplements to support wound healing.   Medications reviewed and include: casodex, lovenox, pepcid, ferrous sulfate, methenamine, ocuvite, lovaza, miralax, vitamin C, ceftriaxone, vancomycin  Labs reviewed:   Diet Order:   Diet Order            Diet regular Room service appropriate? Yes; Fluid consistency: Thin  Diet effective now             EDUCATION NEEDS:   Education needs have been addressed  Skin:  Skin Assessment: Reviewed RN Assessment(Stage IV buttocks ( 7 x 7 x 7 cm) with VAC)  Last BM:  12/31- type 6  Height:   Ht Readings from Last 1 Encounters:  07/09/18 6\' 1"  (1.854 m)    Weight:   Wt  Readings from Last 1 Encounters:  07/09/18 87.9 kg    Ideal Body Weight:  83.6 kg  BMI:  Body mass index is 25.57 kg/m.  Estimated Nutritional Needs:   Kcal:  2300-2600kcal/day   Protein:  124-140g/day   Fluid:  >2.1L/day   Koleen Distance MS, RD, LDN Pager #- [phone] Office#- [phone] After Hours Pager: [phone]

## 2018-07-20 NOTE — Clinical Social Work Placement (Signed)
   CLINICAL SOCIAL WORK PLACEMENT  NOTE  Date:  07/20/2018  Patient Details  Name: [name] MRN: [id number] Date of Birth: [birth year]  Clinical Social Work is seeking post-discharge placement for this patient at the [place] level of care (*CSW will initial, date and re-position this form in  chart as items are completed):  Yes   Patient/family provided with [facility] Work Department's list of facilities offering this level of care within the geographic area requested by the patient (or if unable, by the patient's family).  Yes   Patient/family informed of their freedom to choose among providers that offer the needed level of care, that participate in Medicare, Medicaid or managed care program needed by the patient, have an available bed and are willing to accept the patient.  Yes   Patient/family informed of [facility]'s ownership interest in [facility] and [facility], as well as of the fact that they are under no obligation to receive care at these facilities.  PASRR submitted to EDS on 07/20/18     PASRR number received on 07/20/18     Existing PASRR number confirmed on       FL2 transmitted to all facilities in geographic area requested by pt/family on 07/20/18     FL2 transmitted to all facilities within larger geographic area on       Patient informed that his/her managed care company has contracts with or will negotiate with certain facilities, including the following:        Yes   Patient/family informed of bed offers received.  Patient chooses bed at [facility])     Physician recommends and patient chooses bed at [facility])    Patient to be transferred to [facility]) on 07/20/18.  Patient to be transferred to facility by ems     Patient family notified on 07/20/18 of transfer.  Name of family member notified:  (wife and daughter)     PHYSICIAN       Additional Comment:     _______________________________________________ Shela Leff, LCSW 07/20/2018, 3:17 PM

## 2018-07-20 NOTE — Consult Note (Signed)
Pharmacy Antibiotic Note  [name] is a 67 y.o. male admitted on 07/09/2018 with cellulitis/possible osteomyelitis.  Pharmacy has been consulted for vancomycin dosing. He has been a patient here at [facility] recently and received IV vancomycin. I have used that data to determine the current dosing regimen. -Per consult order note: MRSA cultured from chronic pressure ulcer, non-healing, progressive (wound care-07/05/18)  Plan: 12/30 @ 2300 VT 17 mcg/mL drawn appropriately, will continue current regimen and will schedule a trough for 01/04 @ 1100. Will draw a BMP w/ am labs to ensure renal function is trending adequately.  Height: 6\' 1"  (185.4 cm) Weight: 193 lb 12.6 oz (87.9 kg) IBW/kg (Calculated) : 79.9  Temp (24hrs), Avg:99 F (37.2 C), Min:98.7 F (37.1 C), Max:99.2 F (37.3 C)  Recent Labs  Lab 07/13/18 0345 07/15/18 0432 07/15/18 1132 07/16/18 0422 07/19/18 1148 07/19/18 2249  CREATININE 0.62 0.56*  --  0.72 0.60*  --   VANCOTROUGH  --   --  18  --   --  17    Estimated Creatinine Clearance: 102.6 mL/min (A) (by C-G formula based on SCr of 0.6 mg/dL (L)).    Allergies  Allergen Reactions  . Clindamycin Hives  . Doxycycline Rash  . Penicillins Rash  . Sulfamethoxazole Rash    Antimicrobials this admission: vancomycin 12/20 >>  Cefepime 12/20 >>  Microbiology results: None currently ordered or pending  Thank you for allowing pharmacy to be a part of this patient's care.  Tobie Lords, PharmD, BCPS Clinical Pharmacist 07/20/2018 3:32 AM

## 2018-07-20 NOTE — NC FL2 (Signed)
[place] LEVEL OF CARE SCREENING TOOL     IDENTIFICATION  Patient Name: [name] Birthdate: [birth year] Sex: male Admission Date (Current Location): 07/09/2018  [place] and [place] Number:  Engineering geologist and Address:  [facility], [address], [place], [place] [address]      Provider Number: [id number]  Attending Physician Name and Address:  Fredirick Maudlin, MD  Relative Name and Phone Number:       Current Level of Care: Hospital Recommended Level of Care: [place] Prior Approval Number:    Date Approved/Denied:   PASRR Number:    Discharge Plan: SNF    Current Diagnoses: Patient Active Problem List   Diagnosis Date Noted  . Decubitus ulcer of buttock, stage 4 ([place]) 07/09/2018  . Sepsis ([place]) 05/20/2018  . Pressure injury of skin 05/20/2018  . Hypertension 02/05/2017  . Essential hypertension 04/07/2016  . Carpal tunnel syndrome, bilateral 06/29/2014  . Anorectal pain 07/20/2012  . Pain syndrome, chronic 07/20/2012  . Lumbar spinal cord injury ([place]) 06/29/2012  . Neurogenic bladder 06/29/2012  . Pain in joint, pelvic region and thigh 06/29/2012    Orientation RESPIRATION BLADDER Height & Weight     Self, Time, Situation, Place  Normal Indwelling catheter(patient in and out caths; catheters are 16 french cure catheter hydrophilic coated) Weight: 967 lb 12.6 oz (87.9 kg) Height:  6\' 1"  (185.4 cm)  BEHAVIORAL SYMPTOMS/MOOD NEUROLOGICAL BOWEL NUTRITION STATUS    (none) Incontinent Diet(regular)  AMBULATORY STATUS COMMUNICATION OF NEEDS Skin   Total Care Verbally Wound Vac(will need low air loss mattress)                       Personal Care Assistance Level of Assistance  Bathing, Feeding, Dressing, Total care Bathing Assistance: Maximum assistance Feeding assistance: Maximum assistance Dressing Assistance: Maximum assistance Total Care Assistance: Maximum assistance    Functional Limitations Info  Sight Sight Info: Impaired(glasses)        SPECIAL CARE FACTORS FREQUENCY  (Needs IV ABX for MRSA in wound)                    Contractures Contractures Info: Not present    Additional Factors Info  Code Status, Allergies, Isolation Precautions Code Status Info: full             Current Medications (07/20/2018):  This is the current hospital active medication list Current Facility-Administered Medications  Medication Dose Route Frequency Provider Last Rate Last Dose  . 0.9 %  sodium chloride infusion   Intravenous PRN Fredirick Maudlin, MD   Stopped at 07/19/18 0422  . baclofen (LIORESAL) tablet 10 mg  10 mg Oral Daily PRN Fredirick Maudlin, MD   10 mg at 07/14/18 2249  . bicalutamide (CASODEX) tablet 50 mg  50 mg Oral Daily Fredirick Maudlin, MD   50 mg at 07/19/18 1835  . Chlorhexidine Gluconate Cloth 2 % PADS 6 each  6 each Topical Q0600 Fredirick Maudlin, MD   6 each at 07/20/18 [phone]  . enoxaparin (LOVENOX) injection 40 mg  40 mg Subcutaneous Q24H Fredirick Maudlin, MD   40 mg at 07/19/18 2237  . famotidine (PEPCID) tablet 20 mg  20 mg Oral BID Fredirick Maudlin, MD   20 mg at 07/20/18 0947  . feeding supplement (ENSURE ENLIVE) (ENSURE ENLIVE) liquid 237 mL  237 mL Oral TID BM Fredirick Maudlin, MD   237 mL at 07/19/18 2030  . ferrous  sulfate tablet 325 mg  325 mg Oral Daily Fredirick Maudlin, MD   325 mg at 07/20/18 0948  . gabapentin (NEURONTIN) capsule 800 mg  800 mg Oral TID Fredirick Maudlin, MD   800 mg at 07/20/18 0947  . HYDROmorphone (DILAUDID) injection 0.5 mg  0.5 mg Intravenous Q2H PRN Fredirick Maudlin, MD   0.5 mg at 07/19/18 1051  . ibuprofen (ADVIL,MOTRIN) tablet 600 mg  600 mg Oral Q6H PRN Fredirick Maudlin, MD   600 mg at 07/18/18 1054  . losartan (COZAAR) tablet 100 mg  100 mg Oral Daily Fredirick Maudlin, MD   100 mg at 07/20/18 0957  . methenamine (MANDELAMINE) tablet 1 g  1 g Oral QID Fredirick Maudlin, MD   1 g at 07/20/18 1000   . mirabegron ER (MYRBETRIQ) tablet 50 mg  50 mg Oral Daily Fredirick Maudlin, MD   50 mg at 07/20/18 1000  . multivitamin-lutein (OCUVITE-LUTEIN) capsule 1 capsule  1 capsule Oral Daily Fredirick Maudlin, MD   1 capsule at 07/19/18 1050  . nutrition supplement (JUVEN) (JUVEN) powder packet 1 packet  1 packet Oral BID BM Fredirick Maudlin, MD   1 packet at 07/20/18 1000  . omega-3 acid ethyl esters (LOVAZA) capsule 1 g  1 g Oral Daily Fredirick Maudlin, MD   1 g at 07/19/18 1835  . ondansetron (ZOFRAN-ODT) disintegrating tablet 4 mg  4 mg Oral Q6H PRN Fredirick Maudlin, MD       Or  . ondansetron [facility]) injection 4 mg  4 mg Intravenous Q6H PRN Fredirick Maudlin, MD      . oxybutynin (DITROPAN-XL) 24 hr tablet 10 mg  10 mg Oral Melvern Banker, MD   10 mg at 07/20/18 0617  . oxyCODONE (Oxy IR/ROXICODONE) immediate release tablet 5-10 mg  5-10 mg Oral Q4H PRN Fredirick Maudlin, MD   10 mg at 07/19/18 1834  . polyethylene glycol (MIRALAX / GLYCOLAX) packet 17 g  17 g Oral Daily Fredirick Maudlin, MD   17 g at 07/19/18 1050  . polyvinyl alcohol (LIQUIFILM TEARS) 1.4 % ophthalmic solution 1 drop  1 drop Both Eyes Daily PRN Fredirick Maudlin, MD      . silver nitrate applicators applicator 10 Stick  10 Stick Topical PRN Fredirick Maudlin, MD   10 Stick at 07/16/18 1122  . sodium chloride flush (NS) 0.9 % injection 10-40 mL  10-40 mL Intracatheter Q12H Tsosie Billing, MD   10 mL at 07/20/18 0955  . sodium chloride flush (NS) 0.9 % injection 10-40 mL  10-40 mL Intracatheter PRN Tsosie Billing, MD      . vancomycin (VANCOCIN) 1,250 mg in sodium chloride 0.9 % 250 mL IVPB  1,250 mg Intravenous Q12H Dallie Piles, RPH 166.7 mL/hr at 07/20/18 0106 1,250 mg at 07/20/18 0106  . vitamin C (ASCORBIC ACID) tablet 250 mg  250 mg Oral TID Fredirick Maudlin, MD   250 mg at 07/20/18 7544     Discharge Medications: Please see discharge summary for a list of discharge medications.  Relevant Imaging  Results:  Relevant Lab Results:   Additional Information ss: [id number]  Shela Leff, LCSW

## 2018-07-20 NOTE — Discharge Summary (Signed)
[facility] SURGICAL DISCHARGE SUMMARY   Patient ID: [name] MRN: [id number] DOB/AGE: 1951/04/15 67 y.o.  Admit date: 07/09/2018 Discharge date: 07/20/2018  Discharge Diagnoses Patient Active Problem List   Diagnosis Date Noted  . Decubitus ulcer of buttock, stage 4 ([place]) 07/09/2018  . Sepsis ([place]) 05/20/2018  . Pressure injury of skin 05/20/2018  . Hypertension 02/05/2017  . Essential hypertension 04/07/2016  . Carpal tunnel syndrome, bilateral 06/29/2014  . Anorectal pain 07/20/2012  . Pain syndrome, chronic 07/20/2012  . Lumbar spinal cord injury ([place]) 06/29/2012  . Neurogenic bladder 06/29/2012  . Pain in joint, pelvic region and thigh 06/29/2012    Consultants Infectious Disease Internal Medicine Cardiology   Procedures Irrigation and debridement of full thickness skin ulcer, bone biopsy, soft tissue biopsy; total wound area 49 sq cm.  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  His level of disability is L4.  He has had a pressure ulcer over his right ischial area that started in May 2019.  He has never undergone debridement.  He has received extensive local wound care, however he continues to experience progression of the ulcer.  He has had a number of CT scans, the most recent of which was performed on 28 June 2018.  He has been seen by wound care as well as Dr. Lysle Pearl of general surgery with the [facility] clinic.  Cultures from the wound have grown out methicillin-resistant Staphylococcus aureus.  He was recently placed on oral clindamycin.  Due to the poor wound healing of the area, there has been reluctance to pursue a more aggressive debridement, as this would create an even larger wound to attempt to heal.  The home wound care nurse saw him today and felt that the wound was worse than before and sent him to the emergency department for evaluation.  A repeat CT  scan was performed that shows further progression and tissue breakdown over the right ischio M and greater trochanter.  General surgery is consulted due to worsening of the patient's known wound.  Mr. Grajales states that if he is able to offload the site he does not have much pain.  When he is resting directly on it and during dressing changes it is painful.  He denies fevers or chills.   Hospital Course: Patient was admitted for wound management. On 12/24 he had complaints of chest pain and was evaluated by cardiology for which ACS was ruled out. On 12/26, patient underwent irrigation and debridement, soft tissue biopsy, bone biopsy, as well as placement of wound vac in the OR with Dr Celine Ahr. Post-operatively, patient's pain and discomfort improved/resolved and advancement of patient's diet was well-tolerated. The remainder of patient's hospital course was essentially unremarkable, and discharge planning was initiated accordingly with patient safely able to be discharged to SNF per family request given changes in patients level of care including wound vac and requirement of IV ABx. Outpatient follow-up was arranged for 3 weeks after all of his and his family's questions were answered to their expressed satisfaction.  Discharge Condition: Fair   Physical Examination:  Constitutional: NAD, Well-appearing male Pulmonary: No respiratory distress Skin: Wound vac in place   Allergies as of 07/20/2018      Reactions   Clindamycin Hives   Doxycycline Rash   Penicillins Rash   Sulfamethoxazole Rash      Medication List    TAKE these medications   acetaminophen 500  MG tablet Commonly known as:  TYLENOL Take 1,000 mg by mouth 2 (two) times daily.   baclofen 10 MG tablet Commonly known as:  LIORESAL Take 10 mg by mouth daily as needed for muscle spasms.   bicalutamide 50 MG tablet Commonly known as:  CASODEX Take 50 mg by mouth daily.   cefTRIAXone  IVPB Commonly known as:   ROCEPHIN Inject 2 g into the vein daily for 28 days. Indication:  Osteomyelitis of ischial bone Last Day of Therapy:  08/18/2018 Labs - Twice weekly on Mondays and Thursdays:  BMP, CBC, LFT, ESR Please pull PIC at completion of IV antibiotics Start taking on:  July 21, 2018   collagenase ointment Commonly known as:  SANTYL Apply topically daily.   gabapentin 800 MG tablet Commonly known as:  NEURONTIN Take 1 tablet (800 mg total) by mouth 3 (three) times daily.   Iron 325 (65 Fe) MG Tabs Take 325 mg by mouth daily.   losartan 100 MG tablet Commonly known as:  COZAAR Take 100 mg by mouth daily.   methenamine 1 g tablet Commonly known as:  HIPREX Take 1 g by mouth 3 (three) times daily.   mirabegron ER 50 MG Tb24 tablet Commonly known as:  MYRBETRIQ Take 50 mg by mouth daily.   nutrition supplement (JUVEN) Pack Take 1 packet by mouth 2 (two) times daily between meals.   oxybutynin 10 MG 24 hr tablet Commonly known as:  DITROPAN-XL Take 10 mg by mouth every morning.   oxyCODONE 5 MG immediate release tablet Commonly known as:  Oxy IR/ROXICODONE Take 1-2 tablets (5-10 mg total) by mouth every 6 (six) hours as needed for breakthrough pain.   PAIN MANAGEMENT INTRATHECAL (IT) PUMP 1 each by Intrathecal route continuous. Intrathecal (IT) medication:  Baclofen, Dilaudid   polyethylene glycol powder powder Commonly known as:  GLYCOLAX/MIRALAX Take 17 g by mouth daily.   SYSTANE BALANCE 0.6 % Soln Generic drug:  Propylene Glycol Place 1 drop into both eyes daily as needed (dry eyes).   vancomycin  IVPB Inject 1,250 mg into the vein every 12 (twelve) hours for 28 days. Indication:  Osteomyelitis of ischial bone Last Day of Therapy:  08/18/2018 Labs - Twice weekly on Mondays and Thursdays:  BMP(baseline SCr 0.6), CBC, LFT, ESR  Labs - Once weekly on Mondays:  Vancomycin trough (goal 15-20 mcg/mL) Please pull PIC at completion of IV antibiotics Start taking on:  July 21, 2018   vitamin C 500 MG tablet Commonly known as:  ASCORBIC ACID Take 1,000 mg by mouth 3 (three) times daily.            Home Infusion Instuctions  (From admission, onward)         Start     Ordered   07/20/18 0000  Home infusion instructions [facility] May follow [place] Dosing Protocol; May administer Cathflo as needed to maintain patency of vascular access device.; Flushing of vascular access device: per [facility] Protocol: 0.9% NaCl pre/post medica...    Question Answer Comment  Instructions May follow [place] Dosing Protocol   Instructions May administer Cathflo as needed to maintain patency of vascular access device.   Instructions Flushing of vascular access device: per [facility] Protocol: 0.9% NaCl pre/post medication administration and prn patency; Heparin 100 u/ml, 48m for implanted ports and Heparin 10u/ml, 561mfor all other central venous catheters.   Instructions May follow [facility] Anaphylaxis Protocol for First Dose Administration in the home: 0.9% NaCl at  25-50 ml/hr to maintain IV access for protocol meds. Epinephrine 0.3 ml IV/IM PRN and Benadryl 25-50 IV/IM PRN s/s of anaphylaxis.   Instructions [facility] Infusion Coordinator (RN) to assist per patient IV care needs in the home PRN.      07/20/18 [place]  (From admission, onward)         Start     Ordered   07/15/18 1607  For home use only DME Specialty mattress  Once    Comments:  Low air pressure mattress   07/15/18 1607            Contact information for follow-up providers    Fredirick Maudlin, MD. Schedule an appointment as soon as possible for a visit in 3 week(s).   Specialty:  General Surgery Why:  decubitus ulcer, s/p wound vac....needs follow up in 3 weeks will be seen with Dr Delaine Lame in clinic Contact information: [address] [place] [address] [phone]            Contact information for after-discharge  care    [facility] .   Service:  Skilled Nursing Contact information: [place] [phone]                   -- Edison Simon , [name] [facility]  07/20/2018, 1:51 PM [phone] M-F: 7am - 4pm

## 2018-07-20 NOTE — Progress Notes (Addendum)
RN called report to [employer]. Report given to [facility] LPN. Pt will be discharge with picc line.

## 2018-07-20 NOTE — Evaluation (Signed)
Physical Therapy Evaluation Patient Details Name: [name] MRN: [id number] DOB: 08-01-1950 Today's Date: 07/20/2018   History of Present Illness  67 y.o. male with history of hypertension, prostate cancer, paraplegia (L4 level) secondary to spinal cord injury was admitted to surgical service for nonhealing decubitus ulcer, had I&D 12/26, wound vac in place.  Clinical Impression  Pt eager to work with PT and willing to do all he can as he typically is able to be relatively active and has done little in the last 1-2 weeks.  Pt did relatively well with mobility and showed great UE strength despite being somewhat weaker than his baseline.  He was able to squat-pivot transfer to the recliner from bed with no obvious rubbing/sheering on bed sore/wound vac.  He did not have increase in pain with the effort and though he was not nearly as confident and quick as he is at baseline he generally did well.   Pt apparently has had a pressure relief mattress at home for the last (few?) months but clearly this has not been adequate given his current skin break down and would benefit from an better pressure relief system.  To this end, he also reports that his w/c is ~66 years old with brake issues and (given current condition) likely needs new cushion and pressure relief options.    Patient suffers from paraplegia which impairs his ability to perform daily activities like toileting, dressing, grooming in the home. A cane, walker, or crutch will not resolve the patient's issue with performing activities of daily living. A wheelchair is required and will allow patient to safely perform daily activities.  Pt reports that his current w/c is 67 years old, has poorly working breaks and clearly he would benefit from improve pressure relief/cushion set up.  Patient has been able to safely propel the wheelchair in the home and should still be able to given current status.    Follow Up Recommendations Follow surgeon's  recommendation for DC plan and follow-up therapies;Home health PT(would benefit from PT in which ever setting he ends up)    Equipment Recommendations  Wheelchair & cushion ; Hospital bed (pressure relief, inflation and/or turning)   Recommendations for Other Services       Precautions / Restrictions Precautions Precautions: Fall Restrictions Weight Bearing Restrictions: No      Mobility  Bed Mobility Overal bed mobility: Modified Independent             General bed mobility comments: Pt was able to roll over in bed to assist with BM clean up  Transfers Overall transfer level: Needs assistance Equipment used: None Transfers: Squat Pivot Transfers     Squat pivot transfers: Min guard     General transfer comment: Pt was able to fully clear buttock with "chair push up" and scoot.  PT encouraged him to insure that he is deliberate about avoiding sheering forces on bottom  Ambulation/Gait             General Gait Details: Pt unable to ambulate at baseline, does some shuffling with heavy UE use to transfer to commode  Stairs            Wheelchair Mobility    Modified Rankin (Stroke Patients Only)       Balance Overall balance assessment: Needs assistance Sitting-balance support: Bilateral upper extremity supported Sitting balance-Leahy Scale: Good     Standing balance support: Bilateral upper extremity supported Standing balance-Leahy Scale: (not tested, pt does not stand much at  baseline)                               Pertinent Vitals/Pain Pain Assessment: 0-10(Pt does report normal sensation despite spinal injury) Pain Score: 6  Pain Location: wound site on buttock    Home Living Family/patient expects to be discharged to:: Skilled nursing facility Living Arrangements: Spouse/significant other     Home Access: Ramped entrance       Home Equipment: Electric scooter;Wheelchair - manual;Shower seat;Grab bars - toilet;Hand  held shower head      Prior Function Level of Independence: Independent with assistive device(s)         Comments: Pt generally does not need much help around the home; can shower, dress, transfer, etc w/o assist.     Hand Dominance        Extremity/Trunk Assessment   Upper Extremity Assessment Upper Extremity Assessment: Overall WFL for tasks assessed    Lower Extremity Assessment Lower Extremity Assessment: Generalized weakness(L foot drop, 2/5 hip AB b/l (AD 3+/5), quads R 3-/5 L 2+/5)       Communication   Communication: No difficulties  Cognition Arousal/Alertness: Awake/alert Behavior During Therapy: WFL for tasks assessed/performed Overall Cognitive Status: Within Functional Limits for tasks assessed                                        General Comments      Exercises General Exercises - Lower Extremity Ankle Circles/Pumps: PROM;AROM;5 reps Short Arc Quad: AROM;AAROM;10 reps;Both Heel Slides: AROM;AAROM;10 reps;Both Hip ABduction/ADduction: AAROM;Strengthening;10 reps;Both(no AROM abd b/l) Hip Flexion/Marching: AROM;5 reps;Both   Assessment/Plan    PT Assessment Patient needs continued PT services  PT Problem List Decreased strength;Decreased range of motion;Decreased activity tolerance;Decreased mobility;Decreased knowledge of use of DME;Decreased safety awareness;Pain;Decreased knowledge of precautions;Decreased coordination;Decreased balance       PT Treatment Interventions DME instruction;Functional mobility training;Therapeutic activities;Neuromuscular re-education;Balance training;Therapeutic exercise;Patient/family education;Wheelchair mobility training    PT Goals (Current goals can be found in the Care Plan section)  Acute Rehab PT Goals Patient Stated Goal: improve bed sore and get better equipment at home to avoid futher skin breakdown PT Goal Formulation: With patient Time For Goal Achievement: 08/03/18 Potential to  Achieve Goals: Fair    Frequency Min 2X/week   Barriers to discharge        Co-evaluation               AM-PAC PT "6 Clicks" Mobility  Outcome Measure Help needed turning from your back to your side while in a flat bed without using bedrails?: None Help needed moving from lying on your back to sitting on the side of a flat bed without using bedrails?: A Little Help needed moving to and from a bed to a chair (including a wheelchair)?: A Little Help needed standing up from a chair using your arms (e.g., wheelchair or bedside chair)?: A Lot Help needed to walk in hospital room?: Total Help needed climbing 3-5 steps with a railing? : Total 6 Click Score: 14    End of Session Equipment Utilized During Treatment: Gait belt Activity Tolerance: Patient tolerated treatment well Patient left: with chair alarm set;with call bell/phone within reach;with family/visitor present Nurse Communication: Mobility status PT Visit Diagnosis: Muscle weakness (generalized) (M62.81);Other symptoms and signs involving the nervous system (R29.898);Other abnormalities of gait and mobility (R26.89)  Time: 0925-1000 PT Time Calculation (min) (ACUTE ONLY): 35 min   Charges:   PT Evaluation $PT Eval Moderate Complexity: 1 Mod PT Treatments $Therapeutic Exercise: 8-22 mins $Therapeutic Activity: 8-22 mins        Kreg Shropshire, DPT 07/20/2018, 1:43 PM

## 2018-07-27 ENCOUNTER — Ambulatory Visit: Payer: Medicare Other | Admitting: Infectious Diseases

## 2018-07-27 ENCOUNTER — Encounter: Payer: Medicare Other | Attending: Physician Assistant | Admitting: Physician Assistant

## 2018-07-27 DIAGNOSIS — X58XXXS Exposure to other specified factors, sequela: Secondary | ICD-10-CM | POA: Diagnosis not present

## 2018-07-27 DIAGNOSIS — L03317 Cellulitis of buttock: Secondary | ICD-10-CM | POA: Diagnosis not present

## 2018-07-27 DIAGNOSIS — T148XXS Other injury of unspecified body region, sequela: Secondary | ICD-10-CM | POA: Diagnosis not present

## 2018-07-27 DIAGNOSIS — L89314 Pressure ulcer of right buttock, stage 4: Secondary | ICD-10-CM | POA: Diagnosis present

## 2018-07-27 DIAGNOSIS — C61 Malignant neoplasm of prostate: Secondary | ICD-10-CM | POA: Diagnosis not present

## 2018-07-27 DIAGNOSIS — I110 Hypertensive heart disease with heart failure: Secondary | ICD-10-CM | POA: Insufficient documentation

## 2018-07-27 DIAGNOSIS — Z79890 Hormone replacement therapy (postmenopausal): Secondary | ICD-10-CM | POA: Diagnosis not present

## 2018-07-27 DIAGNOSIS — G8220 Paraplegia, unspecified: Secondary | ICD-10-CM | POA: Insufficient documentation

## 2018-07-28 NOTE — Progress Notes (Signed)
TIGE, MEAS ([id number]) Visit Report for 07/27/2018 Arrival Information Details Patient Name: [name], [name] Date of Service: 07/27/2018 9:15 AM Medical Record Number: [id number] Patient Account Number: [id number] Date of Birth/Sex: 03/16/1951 (67 y.o. M) Treating RN: Montey Hora Primary Care Geneva Pallas: [name] Other Clinician: Referring Kymberlie Brazeau: [name] Treating Issaiah Seabrooks/Extender: [name], HOYT Weeks in Treatment: 84 Visit Information History Since Last Visit Added or deleted any medications: Yes Patient Arrived: Wheel Chair Any new allergies or adverse reactions: No Arrival Time: 08:55 Had a fall or experienced change in No Accompanied By: wife activities of daily living that may affect Transfer Assistance: None risk of falls: Patient Identification Verified: Yes Signs or symptoms of abuse/neglect since last visito No Secondary Verification Process Completed: Yes Hospitalized since last visit: Yes Patient Requires Transmission-Based No Implantable device outside of the clinic excluding No Precautions: cellular tissue based products placed in the center Patient Has Alerts: Yes since last visit: Patient Alerts: NOT Has Dressing in Place as Prescribed: Yes Diabetic Pain Present Now: Yes Electronic Signature(s) Signed: 07/27/2018 3:06:21 PM By: Lorine Bears RCP, RRT, CHT Entered By: Lorine Bears on 07/27/2018 08:56:17 [name] ([id number]) -------------------------------------------------------------------------------- Clinic Level of Care Assessment Details Patient Name: [name] Date of Service: 07/27/2018 9:15 AM Medical Record Number: [id number] Patient Account Number: [id number] Date of Birth/Sex: Jan 26, 1951 (67 y.o. M) Treating RN: Montey Hora Primary Care Meet [place]: [name] Other Clinician: Referring Frankie Zito: [name] Treating Masyn Fullam/Extender: [name],  HOYT Weeks in Treatment: 34 Clinic Level of Care Assessment Items TOOL 4 Quantity Score []  - Use when only an EandM is performed on FOLLOW-UP visit 0 ASSESSMENTS - Nursing Assessment / Reassessment X - Reassessment of Co-morbidities (includes updates in patient status) 1 10 X- 1 5 Reassessment of Adherence to Treatment Plan ASSESSMENTS - Wound and Skin Assessment / Reassessment X - Simple Wound Assessment / Reassessment - one wound 1 5 []  - 0 Complex Wound Assessment / Reassessment - multiple wounds []  - 0 Dermatologic / Skin Assessment (not related to wound area) ASSESSMENTS - Focused Assessment []  - Circumferential Edema Measurements - multi extremities 0 []  - 0 Nutritional Assessment / Counseling / Intervention []  - 0 Lower Extremity Assessment (monofilament, tuning fork, pulses) []  - 0 Peripheral Arterial Disease Assessment (using hand held doppler) ASSESSMENTS - Ostomy and/or Continence Assessment and Care []  - Incontinence Assessment and Management 0 []  - 0 Ostomy Care Assessment and Management (repouching, etc.) PROCESS - Coordination of Care X - Simple Patient / Family Education for ongoing care 1 15 []  - 0 Complex (extensive) Patient / Family Education for ongoing care X- 1 10 Staff obtains Programmer, systems, Records, Test Results / Process Orders []  - 0 Staff telephones HHA, Nursing Homes / Clarify orders / etc []  - 0 Routine Transfer to another Facility (non-emergent condition) []  - 0 Routine Hospital Admission (non-emergent condition) []  - 0 New Admissions / Biomedical engineer / Ordering NPWT, Apligraf, etc. []  - 0 Emergency Hospital Admission (emergent condition) X- 1 10 Simple Discharge Coordination AYAAN, SHUTES. ([id number]) []  - 0 Complex (extensive) Discharge Coordination PROCESS - Special Needs []  - Pediatric / Minor Patient Management 0 []  - 0 Isolation Patient Management []  - 0 Hearing / Language / Visual special needs []  - 0 Assessment of  Community assistance (transportation, D/C planning, etc.) []  - 0 Additional assistance / Altered mentation X- 1 15 Support Surface(s) Assessment (bed, cushion, seat, etc.) INTERVENTIONS - Wound Cleansing / Measurement X - Simple Wound Cleansing -  one wound 1 5 []  - 0 Complex Wound Cleansing - multiple wounds X- 1 5 Wound Imaging (photographs - any number of wounds) []  - 0 Wound Tracing (instead of photographs) X- 1 5 Simple Wound Measurement - one wound []  - 0 Complex Wound Measurement - multiple wounds INTERVENTIONS - Wound Dressings X - Small Wound Dressing one or multiple wounds 1 10 []  - 0 Medium Wound Dressing one or multiple wounds []  - 0 Large Wound Dressing one or multiple wounds []  - 0 Application of Medications - topical []  - 0 Application of Medications - injection INTERVENTIONS - Miscellaneous []  - External ear exam 0 []  - 0 Specimen Collection (cultures, biopsies, blood, body fluids, etc.) []  - 0 Specimen(s) / Culture(s) sent or taken to Lab for analysis []  - 0 Patient Transfer (multiple staff / Civil Service fast streamer / Similar devices) []  - 0 Simple Staple / Suture removal (25 or less) []  - 0 Complex Staple / Suture removal (26 or more) []  - 0 Hypo / Hyperglycemic Management (close monitor of Blood Glucose) []  - 0 Ankle / Brachial Index (ABI) - do not check if billed separately X- 1 5 Vital Signs Clemenson, Edna J. ([id number]) Has the patient been seen at the hospital within the last three years: Yes Total Score: 100 Level Of Care: New/Established - Level 3 Electronic Signature(s) Signed: 07/27/2018 4:56:07 PM By: Montey Hora Entered By: Montey Hora on 07/27/2018 09:45:08 [name] ([id number]) -------------------------------------------------------------------------------- Encounter Discharge Information Details Patient Name: [name] Date of Service: 07/27/2018 9:15 AM Medical Record Number: [id number] Patient Account Number:  [id number] Date of Birth/Sex: Apr 07, 1951 (67 y.o. M) Treating RN: Montey Hora Primary Care Tippi Mccrae: [name] Other Clinician: Referring Buffy Ehler: [name] Treating Keyonta Barradas/Extender: [name], HOYT Weeks in Treatment: 50 Encounter Discharge Information Items Discharge Condition: Stable Ambulatory Status: Wheelchair Discharge Destination: Skilled Nursing Facility Telephoned: No Orders Sent: Yes Transportation: Private Auto Accompanied By: wife Schedule Follow-up Appointment: Yes Clinical Summary of Care: Electronic Signature(s) Signed: 07/27/2018 4:56:07 PM By: Montey Hora Entered By: Montey Hora on 07/27/2018 09:46:40 [name] ([id number]) -------------------------------------------------------------------------------- Lower Extremity Assessment Details Patient Name: [name] Date of Service: 07/27/2018 9:15 AM Medical Record Number: [id number] Patient Account Number: [id number] Date of Birth/Sex: 27-Jul-1950 (67 y.o. M) Treating RN: Secundino Ginger Primary Care Latiesha Harada: [name] Other Clinician: Referring Matty Deamer: [name] Treating Zoha Spranger/Extender: [name], HOYT Weeks in Treatment: 34 Electronic Signature(s) Signed: 07/27/2018 1:34:38 PM By: Secundino Ginger Entered By: Secundino Ginger on 07/27/2018 09:14:09 [name] ([id number]) -------------------------------------------------------------------------------- Multi Wound Chart Details Patient Name: [name] Date of Service: 07/27/2018 9:15 AM Medical Record Number: [id number] Patient Account Number: [id number] Date of Birth/Sex: 07-06-1951 (67 y.o. M) Treating RN: Montey Hora Primary Care Florita Nitsch: [name] Other Clinician: Referring Khaliel Morey: [name] Treating Alven Alverio/Extender: [name], HOYT Weeks in Treatment: 34 Vital Signs Height(in): 73 Pulse(bpm): 99 Weight(lbs): 210 Blood Pressure(mmHg): 159/77 Body Mass Index(BMI):  28 Temperature(F): 98.2 Respiratory Rate 18 (breaths/min): Photos: [N/A:N/A] Wound Location: Right Gluteal fold N/A N/A Wounding Event: Pressure Injury N/A N/A Primary Etiology: Pressure Ulcer N/A N/A Comorbid History: Cataracts, Hypertension, N/A N/A Paraplegia Date Acquired: 11/02/2017 N/A N/A Weeks of Treatment: 34 N/A N/A Wound Status: Open N/A N/A Measurements L x W x D 8.2x2x5 N/A N/A (cm) Area (cm) : 12.881 N/A N/A Volume (cm) : 64.403 N/A N/A % Reduction in Area: -28.10% N/A N/A % Reduction in Volume: -6308.30% N/A N/A Position 1 (o'clock): 2 Maximum Distance 1 (cm): 6.2  Tunneling: Yes N/A N/A Classification: Category/Stage IV N/A N/A Exudate Amount: Medium N/A N/A Exudate Type: Serous N/A N/A Exudate Color: amber N/A N/A Wound Margin: Flat and Intact N/A N/A Granulation Amount: Medium (34-66%) N/A N/A Granulation Quality: Red, Pink N/A N/A Necrotic Amount: Medium (34-66%) N/A N/A Necrotic Tissue: Eschar, Adherent Slough N/A N/A Exposed Structures: Fat Layer (Subcutaneous N/A N/A Tissue) Exposed: Yes Fascia: No CUAUHTEMOC, HUEGEL. ([id number]) Tendon: No Muscle: No Joint: No Bone: No Epithelialization: None N/A N/A Periwound Skin Texture: Excoriation: Yes N/A N/A Induration: No Callus: No Crepitus: No Rash: No Scarring: No Periwound Skin Moisture: Maceration: Yes N/A N/A Dry/Scaly: No Periwound Skin Color: Erythema: Yes N/A N/A Atrophie Blanche: No Cyanosis: No Ecchymosis: No Hemosiderin Staining: No Mottled: No Pallor: No Rubor: No Erythema Location: Circumferential N/A N/A Temperature: No Abnormality N/A N/A Tenderness on Palpation: No N/A N/A Wound Preparation: Ulcer Cleansing: N/A N/A Rinsed/Irrigated with Saline Topical Anesthetic Applied: Other: lidocaine 4% Treatment Notes Electronic Signature(s) Signed: 07/27/2018 4:56:07 PM By: Montey Hora Entered By: Montey Hora on 07/27/2018 09:33:58 MICHAELANGELO, MITTELMAN  ([id number]) -------------------------------------------------------------------------------- [place] Details Patient Name: [name] Date of Service: 07/27/2018 9:15 AM Medical Record Number: [id number] Patient Account Number: [id number] Date of Birth/Sex: 04-13-1951 (67 y.o. M) Treating RN: Montey Hora Primary Care Violetta Lavalle: [name] Other Clinician: Referring Antoneo Ghrist: [name] Treating Aina Rossbach/Extender: [name], HOYT Weeks in Treatment: 71 Active Inactive Orientation to the Wound Care Program Nursing Diagnoses: Knowledge deficit related to the wound healing center program Goals: Patient/caregiver will verbalize understanding of the [facility] Program Date Initiated: 11/30/2017 Target Resolution Date: 12/21/2017 Goal Status: Active Interventions: Provide education on orientation to the wound center Notes: Pressure Nursing Diagnoses: Knowledge deficit related to management of pressures ulcers Goals: Patient/caregiver will verbalize understanding of pressure ulcer management Date Initiated: 05/17/2018 Target Resolution Date: 05/28/2018 Goal Status: Active Interventions: Assess: immobility, friction, shearing, incontinence upon admission and as needed Notes: Wound/Skin Impairment Nursing Diagnoses: Impaired tissue integrity Goals: Patient/caregiver will verbalize understanding of skin care regimen Date Initiated: 11/30/2017 Target Resolution Date: 12/21/2017 Goal Status: Active Ulcer/skin breakdown will have a volume reduction of 30% by week 4 Date Initiated: 11/30/2017 Target Resolution Date: 12/21/2017 Goal Status: Active MYKLE, PASCUA ([id number]) Interventions: Assess patient/caregiver ability to obtain necessary supplies Assess patient/caregiver ability to perform ulcer/skin care regimen upon admission and as needed Assess ulceration(s) every visit Treatment Activities: Skin care regimen initiated :  11/30/2017 Notes: Electronic Signature(s) Signed: 07/27/2018 4:56:07 PM By: Montey Hora Entered By: Montey Hora on 07/27/2018 15:83:09 [name] ([id number]) -------------------------------------------------------------------------------- Pain Assessment Details Patient Name: [name] Date of Service: 07/27/2018 9:15 AM Medical Record Number: [id number] Patient Account Number: [id number] Date of Birth/Sex: 24-Apr-1951 (67 y.o. M) Treating RN: Montey Hora Primary Care Tevyn Codd: [name] Other Clinician: Referring Kayse Puccini: [name] Treating Jjesus Dingley/Extender: [name], HOYT Weeks in Treatment: 34 Active Problems Location of Pain Severity and Description of Pain Patient Has Paino Yes Site Locations Rate the pain. Current Pain Level: 10 Pain Management and Medication Current Pain Management: Electronic Signature(s) Signed: 07/27/2018 3:06:21 PM By: Lorine Bears RCP, RRT, CHT Signed: 07/27/2018 4:56:07 PM By: Montey Hora Entered By: Lorine Bears on 07/27/2018 08:56:29 BRASEN, BUNDREN ([id number]) -------------------------------------------------------------------------------- Patient/Caregiver Education Details Patient Name: [name] Date of Service: 07/27/2018 9:15 AM Medical Record Number: [id number] Patient Account Number: [id number] Date of Birth/Gender: 01-21-1951 (67 y.o. M) Treating RN: Montey Hora Primary Care Physician: [name] Other Clinician: Referring Physician:  Hande, Vishwanath Treating Physician/Extender: [name], HOYT Weeks in Treatment: 31 Education Assessment Education Provided To: Patient and Caregiver wife and SNF nurses Education Topics Provided Offloading: Handouts: Other: continue pressure relief Methods: Explain/Verbal Responses: State content correctly Wound/Skin Impairment: Handouts: Other: signed wound care orders Methods: Facilities manager) Signed: 07/27/2018 4:56:07 PM By: Montey Hora Entered By: Montey Hora on 07/27/2018 09:45:50 BAY, WAYSON ([id number]) -------------------------------------------------------------------------------- Wound Assessment Details Patient Name: [name] Date of Service: 07/27/2018 9:15 AM Medical Record Number: [id number] Patient Account Number: [id number] Date of Birth/Sex: 11/05/1950 (67 y.o. M) Treating RN: Secundino Ginger Primary Care Soleil Mas: [name] Other Clinician: Referring Konor Noren: [name] Treating Armentha Branagan/Extender: [name], HOYT Weeks in Treatment: 34 Wound Status Wound Number: 1 Primary Etiology: Pressure Ulcer Wound Location: Right Gluteal fold Wound Status: Open Wounding Event: Pressure Injury Comorbid History: Cataracts, Hypertension, Paraplegia Date Acquired: 11/02/2017 Weeks Of Treatment: 34 Clustered Wound: No Photos Photo Uploaded By: Secundino Ginger on 07/27/2018 09:21:01 Wound Measurements Length: (cm) 8.2 % Reduction Width: (cm) 2 % Reduction Depth: (cm) 5 Epitheliali Area: (cm) 12.881 Tunneling: Volume: (cm) 64.403 Positio Maximum in Area: -28.1% in Volume: -6308.3% zation: None Yes n (o'clock): 2 Distance: (cm) 6.2 Undermining: No Wound Description Classification: Category/Stage IV Foul Odor A Wound Margin: Flat and Intact Slough/Fibr Exudate Amount: Medium Exudate Type: Serous Exudate Color: amber fter Cleansing: No ino Yes Wound Bed Granulation Amount: Medium (34-66%) Exposed Structure Granulation Quality: Red, Pink Fascia Exposed: No Necrotic Amount: Medium (34-66%) Fat Layer (Subcutaneous Tissue) Exposed: Yes Necrotic Quality: Eschar, Adherent Slough Tendon Exposed: No Muscle Exposed: No Joint Exposed: No ABENEZER, ODONELL. ([id number]) Bone Exposed: No Periwound Skin Texture Texture Color No Abnormalities Noted: No No Abnormalities Noted: No Callus: No Atrophie Blanche:  No Crepitus: No Cyanosis: No Excoriation: Yes Ecchymosis: No Induration: No Erythema: Yes Rash: No Erythema Location: Circumferential Scarring: No Hemosiderin Staining: No Mottled: No Moisture Pallor: No No Abnormalities Noted: No Rubor: No Dry / Scaly: No Maceration: Yes Temperature / Pain Temperature: No Abnormality Wound Preparation Ulcer Cleansing: Rinsed/Irrigated with Saline Topical Anesthetic Applied: Other: lidocaine 4%, Treatment Notes Wound #1 (Right Gluteal fold) Notes silvercel, bordered foam dressing; SNF nurses to reapply NPWT Electronic Signature(s) Signed: 07/27/2018 1:34:38 PM By: Secundino Ginger Entered By: Secundino Ginger on 07/27/2018 09:14:00 [name] ([id number]) -------------------------------------------------------------------------------- Vitals Details Patient Name: [name] Date of Service: 07/27/2018 9:15 AM Medical Record Number: [id number] Patient Account Number: [id number] Date of Birth/Sex: 1951/06/02 (67 y.o. M) Treating RN: Montey Hora Primary Care Charie Pinkus: [name] Other Clinician: Referring Lorece Keach: [name] Treating Denine Brotz/Extender: [name], HOYT Weeks in Treatment: 34 Vital Signs Time Taken: 08:56 Temperature (F): 98.2 Height (in): 73 Pulse (bpm): 99 Weight (lbs): 210 Respiratory Rate (breaths/min): 18 Body Mass Index (BMI): 27.7 Blood Pressure (mmHg): 159/77 Reference Range: 80 - 120 mg / dl Electronic Signature(s) Signed: 07/27/2018 3:06:21 PM By: Lorine Bears RCP, RRT, CHT Entered By: Becky Sax, Amado Nash on 07/27/2018 08:58:51

## 2018-07-28 NOTE — Progress Notes (Signed)
ABDUR, HOGLUND ([id number]) Visit Report for 07/27/2018 Chief Complaint Document Details Patient Name: WIL, SLAPE Date of Service: 07/27/2018 9:15 AM Medical Record Number: [id number] Patient Account Number: [id number] Date of Birth/Sex: Aug 25, 1950 (68 y.o. M) Treating RN: Montey Hora Primary Care Provider: [name] Other Clinician: Referring Provider: [name] Treating Provider/Extender: [name], HOYT Weeks in Treatment: 34 Information Obtained from: Patient Chief Complaint Right gluteal fold ulcer Electronic Signature(s) Signed: 07/28/2018 8:35:34 AM By: Worthy Keeler [name] Entered By: Worthy Keeler on 07/27/2018 09:06:52 MALLORY, ENRIQUES ([id number]) -------------------------------------------------------------------------------- HPI Details Patient Name: [name] Date of Service: 07/27/2018 9:15 AM Medical Record Number: [id number] Patient Account Number: [id number] Date of Birth/Sex: [birth year] (68 y.o. 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 He states that there is no history of diabetes that he is aware of. He does have issues with his prostate and is currently receiving treatment for this by way of oral medication. With that being said I do not have a lot of details in that regard. Nonetheless the patient presents today as a result of having been referred to [place] by another provider initially home health was set to come out and take care of his wound although due to the fact that he  apparently drives he's not able to receive home health. His wife is therefore trying to help take care of this wound within although they have been struggling with what exactly to do at this point. She states that she can do some things but she is definitely not a nurse and does have some issues with looking at blood. The good news is the wound does not appear to be too deep and is fairly superficial at this point. There is no slough noted there is some nonviable skin noted around the surface of the wound and the perimeter at this point. The central portion of the wound appears to be very good with a dermal layer noted this does not appear to be again deep enough to extend it to subcutaneous tissue at this point. 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 In the pump he has Dilaudid and baclofen. 12/07/17 on evaluation today patient presents for follow-up concerning his ongoing lower back thigh ulcer on the right. He states that he did not get the supplies ordered and therefore has not really been able to perform the dressing changes as directed exactly. His wife was able to get some Boarder Foam Dressing's from the drugstore and subsequently has been using hydrogel which did help to a degree in the wound does appear to be able smaller. There is actually more drainage this week noted than previous. 12/21/17 on evaluation today patient appears to be doing rather well in regard to his right gluteal ulcer. He has been tolerating the dressing changes without complication. There does not appear to be any evidence of infection at this point in time. Overall the wound does seem to be making some progress as far as the edges  are concerned there's not as much in the way of overlapping of the external wound edges and he has a good epithelium to wound bed border for the most part. This however is not true  right at the 12 o'clock location over the span of a little over a centimeters which actually will require debridement today to clean this away and hopefully allow it to continue to heal more appropriately. 12/28/17 on evaluation today patient appears to be doing rather well in regard to his ulcer in the left gluteal region. He's been tolerating the dressing changes without complication. Apparently he has had some difficulty getting his dressing material. Apparently there's been some confusion with ordering we're gonna check into this. Nonetheless overall he's been showing signs of improvement which is good news. Debridement is not required today. 01/04/18 on evaluation today patient presents for follow-up concerning his right gluteal ulcer. He has been tolerating the dressing changes fairly well. On inspection today it appears he may actually have some maceration them concerned about the fact that he may be developing too much moisture in and around the wound bed which can cause delay in healing. With that being said he unfortunately really has not showed significant signs of improvement since last week's evaluation in fact this may even be just the little bit/slightly larger. Nonetheless he's been having a lot of discomfort I'm not sure this is even related to the wound as he has no pain when I'm to breeding or otherwise cleaning the wound during evaluation today. Nonetheless this is something that we did recommend he talked to his pain specialist concerning. 01/11/18 on evaluation today patient appears to be doing better in regard to his ulceration. He has been tolerating the dressing changes without complication. With that being said overall there's no evidence of infection which is good news. The only thing is he did receive the hatch affair blue classic versus the ready nonetheless I feel like this is perfectly fine and appears to have done well for him over the past week. 01/25/18 on evaluation  today patient's wound actually appears to be a little bit larger than during the last evaluation. The good MOSES, ODOHERTY. ([id number]) news is the majority of the wound edges actually appear to be fairly firmly attached to the wound bed unfortunately again we're not really making progress in regard to the size. Roughly the wound is about the same size as when I first saw him although again the wound margin/edges appear to be much better. 02/01/18 on evaluation today patient actually appears to be doing very well in regard to his wound. Applying the Prisma dry does seem to be better although he does still have issues with slow progression of the wound. There was a slight improvement compared to last week's measurements today. Nonetheless I have been considering other options as far as the possibility of Theraskin or even a snap vac. In general I'm not sure that the Theraskin due to location of the wound would be a very good idea. Nonetheless I do think that a snap vac could be a possibility for the patient and in fact I think this could even be an excellent way to manage the wound possibly seeing some improvement in a very rapid fashion here. Nonetheless this is something that we would need to get approved and I did have a lengthy conversation with the patient about this today. 02/08/18 on evaluation today patient appears to be doing a little better in regard to his ulcer.  He has been tolerating the dressing changes without complication. Fortunately despite the fact that the wound is a little bit smaller it's not significantly so unfortunately. We have discussed the possibility of a snap vac we did check with insurance this is actually covered at this point. Fortunately there does not appear to be any sign of infection. Overall I'm fairly pleased with how things seem to be appearing at this point. 02/15/18 on evaluation today patient appears to be doing rather well in regard to his right gluteal  ulcer. Unfortunately the snap vac did not stay in place with his sheer and friction this came loose and did not seem to maintain seal very well. He worked for about two days and it did seem to do very well during that time according to his wife but in general this does not seem to be something that's gonna be beneficial for him long-term. I do believe we need to go back to standard dressings to see if we can find something that will be of benefit. 03/02/18- He is here in follow up evaluation; there is minimal change in the wound. He will continue with the same treatment plan, would consider changing to iodosrob/iodoflex if ulcer continues to to plateau. He will follow up next week 03/08/18 on evaluation today patient's wound actually appears to be about the same size as when I previously saw him several weeks back. Unfortunately he does have some slightly dark discoloration in the central portion of the wound which has me concerned about pressure injury. I do believe he may be sitting for too long a period of time in fact he tells me that "I probably sit for much too long". He does have some Slough noted on the surface of the wound and again as far as the size of the wound is concerned I'm really not seeing anything that seems to have improved significantly. 03/15/18 on evaluation today patient appears to be doing fairly well in regard to his ulcer. The wound measured pretty much about the same today compared to last week's evaluation when looking at his graph. With that being said the area of bruising/deep tissue injury that was noted last week I do not see at this point. He did get a new cushion fortunately this does seem to be have been of benefit in my pinion. It does appear that he's been off of this more which is good news as well I think that is definitely showing in the overall wound measurements. With that being said I do believe that he needs to continue to offload I don't think that the fact  this is doing better should be or is going to allow him to not have to offload and explain this to him as well. Overall he seems to be in agreement the plan I think he understands. The overall appearance of the wound bed is improved compared to last week I think the Iodoflex has been beneficial in that regard. 03/29/18 on evaluation today patient actually appears to be doing rather well in regard to his wound from the overall appearance standpoint he does have some granulation although there's some Slough on the surface of the wound noted as well. With that being said he unfortunately has not improved in regard to the overall measurement of the wound in volume or in size. I did have a discussion with him very specifically about offloading today. He actually does work although he mainly is just sitting throughout the day. He tells me  he offloads by "lifting himself up for 30 seconds off of his chair occasionally" purchase from [employer] which does seem to have helped. And he has a new cushion that he with that being said he's also able to stand some for a very short period of time but not significant enough I think to provide appropriate offloading. I think the biggest issue at this point with the wound and the fact is not healing as quickly as we would like is due to the fact that he is really not able to appropriately offload while at work. He states the beginning after his injury he actually had a bed at his job that he could lay on in order to offload and that does seem to have been of help back at that time. Nonetheless he had not done this in quite some time unfortunately. I think that could be helpful for him this is something I would like for him to look into. 04/05/18 on evaluation today patient actually presents for follow-up concerning his right gluteal ulcer. Again he really is not significantly improved even compared to last week. He has been tolerating the dressing changes without  complication. With that being said fortunately there appears to be no evidence of infection at this time. He has been more proactive in trying to offload. DEANNA, BOEHLKE ([id number]) 04/12/18 on evaluation today patient actually appears to be doing a little better in regard to his wound and the right gluteal fold region. He's been tolerating the dressing changes since removing the oasis without complication. However he was having a lot of burning initially with the oasis in place. He's unsure of exactly why this was given so much discomfort but he assumes that it was the oasis itself causing the problem. Nonetheless this had to be removed after about three days in place although even those three days seem to have made a fairly good improvement in regard to the overall appearance of the wound bed. In fact is the first time that he's made any improvement from the standpoint of measurements in about six weeks. He continues to have no discomfort over the area of the wound itself which leads me to wonder why he was having the burning with the oasis when he does not even feel the actual debridement's themselves. I am somewhat perplexed by this. 04/19/18 on evaluation today patient's wound actually appears to be showing signs of epithelialization around the edge of the wound and in general actually appears to be doing better which is good news. He did have the same burning after about three days with applying the Endoform last week in the same fashion that I would generally apply a skin substitute. This seems to indicate that it's not the oasis to cause the problem but potentially the moisture buildup that just causes things to burn or there may be some other reaction with the skin prep or Steri-Strips. Nonetheless I'm not sure that is gonna be able to tolerate any skin substitute for a long period of time. The good news is the wound actually appears to be doing better today compared to last week and  does seem to finally be making some progress. 04/26/18 on evaluation today patient actually appears to be doing rather well in regard to his ulcer in the right gluteal fold. He has been tolerating the dressing changes without complication which is good news. The Endoform does seem to be helping although he was a little bit more macerated this week. This  seems to be an ongoing issue with fluid control at this point. Nonetheless I think we may be able to add something like Drawtex to help control the drainage. 05/03/18 on evaluation today patient appears to actually be doing better in regard to the overall appearance of his wound. He has been tolerating the dressing changes without complication. Fortunately there appears to be no evidence of infection at this time. I really feel like his wound has shown signs as of today of turning around last week I thought so as well and definitely he could be seen in this week's overall appearance and measurements. In general I'm very pleased with the fact that he finally seems to be making a steady but sure progress. The patient likewise is very pleased. 05/17/18 on evaluation today patient appears to be doing more poorly unfortunately in regard to his ulcer. He has been tolerating the dressing changes without complication. With that being said he tells me that in the past couple of days he and his wife have noticed that we did not seem to be doing quite as well is getting dark near the center. Subsequently upon evaluation today the wound actually does appear to be doing worse compared to previous. He has been tolerating the dressing changes otherwise and he states that he is not been sitting up anymore than he was in the past from what he tells me. Still he has continued to work he states "I'm tired of dealing with this and if I have to just go home and lay in the bed all the time that's what I'll do". Nonetheless I am concerned about the fact that this wound does  appear to be deeper than what it was previous. 05/24/18 upon evaluation today patient actually presents after having been in the hospital due to what was presumed to be sepsis secondary to the wound infection. He had an elevated white blood cell count between 14 and 15. With that being said he does seem to be doing somewhat better now. His wound still is giving him some trouble nonetheless and he is obviously concerned about the fact likely talked about that this does seem to go more deeply than previously noted. I did review his wound culture which showed evidence of Staphylococcus aureus him and group B strep. Nonetheless he is on antibiotics, Levaquin, for this. Subsequently I did review his intake summary from the hospital as well. I also did look at the CT of the lumbar spine with contrast that was performed which showed no bone destruction to suggest lumbar disguises/osteomyelitis or sacral osteomyelitis. There was no paraspinal abscess. Nonetheless it appears this may have been more of just a soft tissue infection at this point which is good news. He still is nonetheless concerned about the wound which again I think is completely reasonable considering everything he's been through recently. 05/31/18 on evaluation today on evaluation today patient actually appears to be showing signs of his wound be a little bit deeper than what I would like to see. Fortunately he does not show any signs of significant infection although his temperature was 99 today he states he's been checking this at home and has not been elevated. Nonetheless with the undermining that I'm seeing at this point I am becoming more concerned about the wound I do think that offloading is a key factor here that is preventing the speedy recovery at this point. There does not appear to be any evidence of again over infection noted. He's been using  Santyl currently. 06/07/18 the patient presents today for follow-up evaluation  regarding the left ulcer in the gluteal region. He has been tolerating the Wound VAC fairly well. He is obviously very frustrated with this he states that to mean is really getting in his way. There does not appear to be any evidence of infection at this time he does have a little bit of odor I do not necessarily associate this with infection just something that we sometimes notice with Wound VAC therapy. With that being said I can definitely catch a tone of discontentment overall in the patient's demeanor today. This when he was previously in the hospital ARSHIA, RONDON. ([id number]) an CT scan was done of the lumbar region which did not reveal any signs of osteomyelitis. With that being said the pelvis in particular was not evaluated distinctly which means he could still have some osteonecrosis I. Nonetheless the Wound VAC was started on Thursday I do want to get this little bit more time before jumping to a CT scan of the pelvis although that is something that I might would recommend if were not see an improvement by that time. 06/14/18 on evaluation today patient actually appears to be doing about the same in regard to his right gluteal ulcer. Again he did have a CT scan of the lumbar spine unfortunately this did not include the pelvis. Nonetheless with the depth of the wound that I'm seeing today even despite the fact that I'm not seeing any evidence of overt cellulitis I believe there's a good chance that we may be dealing with osteomyelitis somewhere in the right Ischial region. No fevers, chills, nausea, or vomiting noted at this time. 06/21/18 on evaluation today patient actually appears to be doing about the same with regard to his wound. The tunnel at 6 o'clock really does not appear to be any deeper although it is a little bit wider. I think at this point you may want to start packing this with white phone. Unfortunately I have not got approval for the CT scan of the pelvis as of yet  due to the fact that Medicare apparently has been denied it due to the diagnosis codes not being appropriate according to Medicare for the test requested. With that being said the patient cannot have an MRI and therefore this is the only option that we have as far as testing is concerned. The patient has had infection and was on antibiotics and been added code for cellulitis of the bottom to see if this will be appropriate for getting the test approved. Nonetheless I'm concerned about the infection have been spread deeper into the Ischial region. 06/28/18 on evaluation today patient actually appears to be doing rather well all things considered in regard to the right gluteal ulcer. He has been tolerating the dressing changes without complication. With that being said the Wound VAC he states does have to be replaced almost every day or at least reinforced unfortunately. Patient actually has his CT scan later this morning we should have the results by tomorrow. 07/05/18 on evaluation today patient presents for follow-up concerning his right Ischial ulcer. He did see the surgeon Dr. Lysle Pearl last week. They were actually very happy with him and felt like he spent a tremendous amount of time with them as far as discussing his situation was concerned. In the end Dr. Lysle Pearl did contact me as well and determine that he would not recommend any surgical intervention at this point as he felt like  it would not be in the patient's best interest based on what he was seeing. He recommended a referral to infectious disease. Subsequently this is something that Dr. Ines Bloomer office is working on setting up for the patient. As far as evaluation today is concerned the patient's wound actually appears to be worse at this point. I am concerned about how things are progressing and specifically about infection. I do not feel like it's the deeper but the area of depth is definitely widening which does have me concerned. No  fevers, chills, nausea, or vomiting noted at this time. I think that we do need initiate antibiotic therapy the patient has an allow allergy to amoxicillin/penicillin he states that he gets a rash since childhood. Nonetheless she's never had the issues with Catholics or cephalosporins in general but he is aware of. 07/27/18 on evaluation today patient presents following admission to the hospital on 07/09/18. He was subsequently discharged on 07/20/18. On 07/15/18 the patient underwent irrigation and debridement was soft tissue biopsy and bone biopsy as well as placement of a Wound VAC in the OR by Dr. Celine Ahr. During the hospital course the patient was placed on a Wound VAC and recommended follow up with surgery in three weeks actually with Dr. Delaine Lame who is infectious disease. The patient was on vancomycin during the hospital course. 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 He also had a bone culture which revealed evidence of methicillin-resistant staph aureus. He is updated CT scan 07/09/18 reveals that he had progression of the which was performed on wound to breakdown down to the trochanter where he actually had irregularities there as well suggestive of osteomyelitis. This was a change just since 9 December when we last performed a CT scan. Obviously this one had gone downhill quite significantly and rapidly. At this point upon evaluation I feel like in general the patient's wound seems to be doing fairly well all things considered upon my evaluation today. Obviously this is larger and deeper than what I previously evaluated but at the same time he seems to be making some progress as far as the appearance of the granulation tissue is concerned. I'm happy in that regard. No fevers, chills, nausea, or vomiting noted at this time. He is on IV vancomycin and Rocephin at the facility. He is currently in [employer]. Electronic Signature(s) Signed:  07/28/2018 8:35:34 AM By: Worthy Keeler [name] Entered By: Worthy Keeler on 07/27/2018 10:10:01 ULYS, FAVIA ([id number]) -------------------------------------------------------------------------------- Physical Exam Details Patient Name: [name] Date of Service: 07/27/2018 9:15 AM Medical Record Number: [id number] Patient Account Number: [id number] Date of Birth/Sex: 04/07/1951 (67 y.o. M) Treating RN: Montey Hora Primary Care Provider: [name] Other Clinician: Referring Provider: [name] Treating Provider/Extender: [name], HOYT Weeks in Treatment: 71 Constitutional Well-nourished and well-hydrated in no acute distress. Respiratory normal breathing without difficulty. clear to auscultation bilaterally. Cardiovascular regular rate and rhythm with normal S1, S2. Psychiatric this patient is able to make decisions and demonstrates good insight into disease process. Alert and Oriented x 3. pleasant and cooperative. Notes On evaluation today patient's wound bed shows good granulation. I do not feeling the obvious exposed bone on evaluation at this time. The wound is significantly larger and deeper than what I previously saw but nonetheless I do feel like she's making good progress which is great news. Electronic Signature(s) Signed: 07/28/2018 8:35:34 AM By: Worthy Keeler [name] Entered By: Worthy Keeler on 07/27/2018 10:10:35  PAO, HAFFEY ([id number]) -------------------------------------------------------------------------------- Physician Orders Details Patient Name: AVRAM, DANIELSON Date of Service: 07/27/2018 9:15 AM Medical Record Number: [id number] Patient Account Number: [id number] Date of Birth/Sex: 10/23/1950 (67 y.o. 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 Primary Wound Dressing Wound #1 Right Gluteal fold o Silver Alginate - in clinic until SNF restarts NPWT Secondary Dressing Wound #1 Right Gluteal fold o Boardered Foam Dressing Dressing Change Frequency Wound #1 Right Gluteal fold o Change Dressing Monday, Wednesday, Friday - and as needed Follow-up Appointments Wound #1 Right Gluteal fold o Return Appointment in 1 week. Off-Loading Wound #1 Right Gluteal fold o Mattress - Please continue air mattress at SNF o Turn and reposition every 2 hours Negative Pressure Wound Therapy Wound #1 Right Gluteal fold Hara, Deangleo J. ([id number]) o Wound VAC settings at 125/130 mmHg continuous pressure. Use BLACK/GREEN foam to wound cavity. Use WHITE foam to fill any tunnel/s and/or undermining. Change VAC dressing 3 X WEEK. Change canister as indicated when full. Nurse may titrate settings and frequency of dressing changes as clinically indicated. - white foam to any tunnels o Home Health Nurse may d/c VAC for s/s of increased infection, significant wound regression, or uncontrolled drainage. [place] at [phone]. o Number of foam/gauze pieces used in the dressing = Medications-please add to medication list. Wound #1 Right Gluteal fold o Other: - IV antibiotics Electronic Signature(s) Signed: 07/27/2018 4:56:07 PM By: Montey Hora Signed: 07/28/2018 8:35:34 AM By: Worthy Keeler [name] Entered By: Montey Hora on 07/27/2018 09:42:51 LAYMOND, POSTLE ([id number]) -------------------------------------------------------------------------------- Problem List Details Patient Name: [name] Date of Service: 07/27/2018 9:15 AM Medical Record Number: [id number] Patient Account Number: [id number] Date of Birth/Sex: 02-20-1951 (67 y.o. M) Treating RN: Montey Hora Primary Care Provider: [name] Other Clinician: Referring Provider: [name] Treating Provider/Extender: [name], HOYT Weeks in Treatment: 37 Active Problems ICD-10 Evaluated Encounter Code Description Active Date Today Diagnosis L89.314 Pressure ulcer of right buttock, stage 4 11/30/2017 No Yes L03.317 Cellulitis of buttock 06/21/2018 No Yes G82.20 Paraplegia, unspecified 11/30/2017 No Yes S34.109S Unspecified injury to unspecified level of lumbar spinal cord, 11/30/2017 No Yes sequela I10 Essential (primary) hypertension 11/30/2017 No Yes Inactive Problems Resolved Problems Electronic Signature(s) Signed: 07/28/2018 8:35:34 AM By: Worthy Keeler [name] Entered By: Worthy Keeler on 07/27/2018 09:06:47 [name] ([id number]) -------------------------------------------------------------------------------- Progress Note Details Patient Name: [name] Date of Service: 07/27/2018 9:15 AM Medical Record Number: [id number] Patient Account Number: [id number] Date of Birth/Sex: Jul 06, 1951 (67 y.o. 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 He states that there is no history of diabetes that he is aware of. He does have issues with his prostate and is currently receiving treatment for this by way of oral medication. With that being said I do not have a lot of details in that regard. Nonetheless the patient presents today as a result of having been referred to [place] by another provider initially home health was set to come out and take care of his wound although due to the fact that he apparently drives he's not able to receive home health. His wife is therefore trying to help take care of this wound within although they have been struggling with what exactly to do at this point. She states that she can do some things but she is definitely not a nurse and does have some issues with looking at blood. The good news is the wound does not appear to be too deep and is fairly superficial at this point. There is no slough noted there is some nonviable skin noted around the surface of the wound and the perimeter at this point. The central portion of the wound appears to be very good with a dermal layer noted this does not appear to be again deep enough to extend it to subcutaneous tissue at this point. 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 In the pump he has Dilaudid and baclofen. 12/07/17 on evaluation today patient presents for follow-up concerning his ongoing lower back thigh ulcer on the right. He states that he did not get the supplies ordered and therefore has not really been able to perform the dressing changes as directed exactly. His wife was able to get some Boarder Foam Dressing's from the drugstore and subsequently has been using hydrogel which did help to a degree in the wound does appear to be able smaller. There is  actually more drainage this week noted than previous. 12/21/17 on evaluation today patient appears to be doing rather well in regard to his right gluteal ulcer. He has been tolerating the dressing changes without complication. There does not appear to be any evidence of infection at this point in time. Overall the wound does seem to be making some progress as far as the edges are concerned there's not as much in the way of overlapping of the external wound edges and he has a good epithelium to wound bed border for the most part. This however is not true right at the 12 o'clock location over the span of a little over a centimeters which actually will require debridement today to clean this away and hopefully allow it to continue to heal more appropriately. 12/28/17 on evaluation today patient appears to be doing rather well in regard to his ulcer in the left gluteal region. He's been tolerating the dressing changes without complication. Apparently he has had some difficulty getting his dressing material. Apparently there's been some confusion with ordering we're gonna check into this. Nonetheless overall he's been showing signs of improvement which is good news. Debridement is not required today. 01/04/18 on evaluation today patient presents for follow-up concerning his right gluteal ulcer. He has been tolerating the dressing changes fairly well. On inspection today it appears he may actually have some maceration them concerned about the fact that he  may be developing too much moisture in and around the wound bed which can cause delay in healing. With that being said he unfortunately really has not showed significant signs of improvement since last week's evaluation in fact this may even be just the little bit/slightly larger. Nonetheless he's been having a lot of discomfort I'm not sure this is even related to the wound as he has no pain when I'm to breeding or otherwise cleaning the wound during  evaluation today. Nonetheless this is something that we did recommend he talked to his pain specialist concerning. 01/11/18 on evaluation today patient appears to be doing better in regard to his ulceration. He has been tolerating the dressing Vowles, Elnathan J. ([id number]) changes without complication. With that being said overall there's no evidence of infection which is good news. The only thing is he did receive the hatch affair blue classic versus the ready nonetheless I feel like this is perfectly fine and appears to have done well for him over the past week. 01/25/18 on evaluation today patient's wound actually appears to be a little bit larger than during the last evaluation. The good news is the majority of the wound edges actually appear to be fairly firmly attached to the wound bed unfortunately again we're not really making progress in regard to the size. Roughly the wound is about the same size as when I first saw him although again the wound margin/edges appear to be much better. 02/01/18 on evaluation today patient actually appears to be doing very well in regard to his wound. Applying the Prisma dry does seem to be better although he does still have issues with slow progression of the wound. There was a slight improvement compared to last week's measurements today. Nonetheless I have been considering other options as far as the possibility of Theraskin or even a snap vac. In general I'm not sure that the Theraskin due to location of the wound would be a very good idea. Nonetheless I do think that a snap vac could be a possibility for the patient and in fact I think this could even be an excellent way to manage the wound possibly seeing some improvement in a very rapid fashion here. Nonetheless this is something that we would need to get approved and I did have a lengthy conversation with the patient about this today. 02/08/18 on evaluation today patient appears to be doing a little  better in regard to his ulcer. He has been tolerating the dressing changes without complication. Fortunately despite the fact that the wound is a little bit smaller it's not significantly so unfortunately. We have discussed the possibility of a snap vac we did check with insurance this is actually covered at this point. Fortunately there does not appear to be any sign of infection. Overall I'm fairly pleased with how things seem to be appearing at this point. 02/15/18 on evaluation today patient appears to be doing rather well in regard to his right gluteal ulcer. Unfortunately the snap vac did not stay in place with his sheer and friction this came loose and did not seem to maintain seal very well. He worked for about two days and it did seem to do very well during that time according to his wife but in general this does not seem to be something that's gonna be beneficial for him long-term. I do believe we need to go back to standard dressings to see if we can find something that will be of benefit. 03/02/18-  He is here in follow up evaluation; there is minimal change in the wound. He will continue with the same treatment plan, would consider changing to iodosrob/iodoflex if ulcer continues to to plateau. He will follow up next week 03/08/18 on evaluation today patient's wound actually appears to be about the same size as when I previously saw him several weeks back. Unfortunately he does have some slightly dark discoloration in the central portion of the wound which has me concerned about pressure injury. I do believe he may be sitting for too long a period of time in fact he tells me that "I probably sit for much too long". He does have some Slough noted on the surface of the wound and again as far as the size of the wound is concerned I'm really not seeing anything that seems to have improved significantly. 03/15/18 on evaluation today patient appears to be doing fairly well in regard to his ulcer.  The wound measured pretty much about the same today compared to last week's evaluation when looking at his graph. With that being said the area of bruising/deep tissue injury that was noted last week I do not see at this point. He did get a new cushion fortunately this does seem to be have been of benefit in my pinion. It does appear that he's been off of this more which is good news as well I think that is definitely showing in the overall wound measurements. With that being said I do believe that he needs to continue to offload I don't think that the fact this is doing better should be or is going to allow him to not have to offload and explain this to him as well. Overall he seems to be in agreement the plan I think he understands. The overall appearance of the wound bed is improved compared to last week I think the Iodoflex has been beneficial in that regard. 03/29/18 on evaluation today patient actually appears to be doing rather well in regard to his wound from the overall appearance standpoint he does have some granulation although there's some Slough on the surface of the wound noted as well. With that being said he unfortunately has not improved in regard to the overall measurement of the wound in volume or in size. I did have a discussion with him very specifically about offloading today. He actually does work although he mainly is just sitting throughout the day. He tells me he offloads by "lifting himself up for 30 seconds off of his chair occasionally" purchase from [employer] which does seem to have helped. And he has a new cushion that he with that being said he's also able to stand some for a very short period of time but not significant enough I think to provide appropriate offloading. I think the biggest issue at this point with the wound and the fact is not healing as quickly as we would like is due to the fact that he is really not able to appropriately offload while at work.  He states the beginning after his injury he actually had a bed at his job that he could lay on in order to offload and that does seem to have been of help back at that time. Nonetheless he had not done this in quite some time unfortunately. I think that could be helpful for him this is something I would like for him to look into. ADRIANO, BISCHOF ([id number]) 04/05/18 on evaluation today patient actually presents for  follow-up concerning his right gluteal ulcer. Again he really is not significantly improved even compared to last week. He has been tolerating the dressing changes without complication. With that being said fortunately there appears to be no evidence of infection at this time. He has been more proactive in trying to offload. 04/12/18 on evaluation today patient actually appears to be doing a little better in regard to his wound and the right gluteal fold region. He's been tolerating the dressing changes since removing the oasis without complication. However he was having a lot of burning initially with the oasis in place. He's unsure of exactly why this was given so much discomfort but he assumes that it was the oasis itself causing the problem. Nonetheless this had to be removed after about three days in place although even those three days seem to have made a fairly good improvement in regard to the overall appearance of the wound bed. In fact is the first time that he's made any improvement from the standpoint of measurements in about six weeks. He continues to have no discomfort over the area of the wound itself which leads me to wonder why he was having the burning with the oasis when he does not even feel the actual debridement's themselves. I am somewhat perplexed by this. 04/19/18 on evaluation today patient's wound actually appears to be showing signs of epithelialization around the edge of the wound and in general actually appears to be doing better which is good news. He did  have the same burning after about three days with applying the Endoform last week in the same fashion that I would generally apply a skin substitute. This seems to indicate that it's not the oasis to cause the problem but potentially the moisture buildup that just causes things to burn or there may be some other reaction with the skin prep or Steri-Strips. Nonetheless I'm not sure that is gonna be able to tolerate any skin substitute for a long period of time. The good news is the wound actually appears to be doing better today compared to last week and does seem to finally be making some progress. 04/26/18 on evaluation today patient actually appears to be doing rather well in regard to his ulcer in the right gluteal fold. He has been tolerating the dressing changes without complication which is good news. The Endoform does seem to be helping although he was a little bit more macerated this week. This seems to be an ongoing issue with fluid control at this point. Nonetheless I think we may be able to add something like Drawtex to help control the drainage. 05/03/18 on evaluation today patient appears to actually be doing better in regard to the overall appearance of his wound. He has been tolerating the dressing changes without complication. Fortunately there appears to be no evidence of infection at this time. I really feel like his wound has shown signs as of today of turning around last week I thought so as well and definitely he could be seen in this week's overall appearance and measurements. In general I'm very pleased with the fact that he finally seems to be making a steady but sure progress. The patient likewise is very pleased. 05/17/18 on evaluation today patient appears to be doing more poorly unfortunately in regard to his ulcer. He has been tolerating the dressing changes without complication. With that being said he tells me that in the past couple of days he and his wife have noticed  that  we did not seem to be doing quite as well is getting dark near the center. Subsequently upon evaluation today the wound actually does appear to be doing worse compared to previous. He has been tolerating the dressing changes otherwise and he states that he is not been sitting up anymore than he was in the past from what he tells me. Still he has continued to work he states "I'm tired of dealing with this and if I have to just go home and lay in the bed all the time that's what I'll do". Nonetheless I am concerned about the fact that this wound does appear to be deeper than what it was previous. 05/24/18 upon evaluation today patient actually presents after having been in the hospital due to what was presumed to be sepsis secondary to the wound infection. He had an elevated white blood cell count between 14 and 15. With that being said he does seem to be doing somewhat better now. His wound still is giving him some trouble nonetheless and he is obviously concerned about the fact likely talked about that this does seem to go more deeply than previously noted. I did review his wound culture which showed evidence of Staphylococcus aureus him and group B strep. Nonetheless he is on antibiotics, Levaquin, for this. Subsequently I did review his intake summary from the hospital as well. I also did look at the CT of the lumbar spine with contrast that was performed which showed no bone destruction to suggest lumbar disguises/osteomyelitis or sacral osteomyelitis. There was no paraspinal abscess. Nonetheless it appears this may have been more of just a soft tissue infection at this point which is good news. He still is nonetheless concerned about the wound which again I think is completely reasonable considering everything he's been through recently. 05/31/18 on evaluation today on evaluation today patient actually appears to be showing signs of his wound be a little bit deeper than what I would like to  see. Fortunately he does not show any signs of significant infection although his temperature was 99 today he states he's been checking this at home and has not been elevated. Nonetheless with the undermining that I'm seeing at this point I am becoming more concerned about the wound I do think that offloading is a key factor here that is preventing the speedy recovery at this point. There does not appear to be any evidence of again over infection noted. He's been using Santyl currently. LEELAN, RAJEWSKI ([id number]) 06/07/18 the patient presents today for follow-up evaluation regarding the left ulcer in the gluteal region. He has been tolerating the Wound VAC fairly well. He is obviously very frustrated with this he states that to mean is really getting in his way. There does not appear to be any evidence of infection at this time he does have a little bit of odor I do not necessarily associate this with infection just something that we sometimes notice with Wound VAC therapy. With that being said I can definitely catch a tone of discontentment overall in the patient's demeanor today. This when he was previously in the hospital an CT scan was done of the lumbar region which did not reveal any signs of osteomyelitis. With that being said the pelvis in particular was not evaluated distinctly which means he could still have some osteonecrosis I. Nonetheless the Wound VAC was started on Thursday I do want to get this little bit more time before jumping to a CT scan of the  pelvis although that is something that I might would recommend if were not see an improvement by that time. 06/14/18 on evaluation today patient actually appears to be doing about the same in regard to his right gluteal ulcer. Again he did have a CT scan of the lumbar spine unfortunately this did not include the pelvis. Nonetheless with the depth of the wound that I'm seeing today even despite the fact that I'm not seeing any  evidence of overt cellulitis I believe there's a good chance that we may be dealing with osteomyelitis somewhere in the right Ischial region. No fevers, chills, nausea, or vomiting noted at this time. 06/21/18 on evaluation today patient actually appears to be doing about the same with regard to his wound. The tunnel at 6 o'clock really does not appear to be any deeper although it is a little bit wider. I think at this point you may want to start packing this with white phone. Unfortunately I have not got approval for the CT scan of the pelvis as of yet due to the fact that Medicare apparently has been denied it due to the diagnosis codes not being appropriate according to Medicare for the test requested. With that being said the patient cannot have an MRI and therefore this is the only option that we have as far as testing is concerned. The patient has had infection and was on antibiotics and been added code for cellulitis of the bottom to see if this will be appropriate for getting the test approved. Nonetheless I'm concerned about the infection have been spread deeper into the Ischial region. 06/28/18 on evaluation today patient actually appears to be doing rather well all things considered in regard to the right gluteal ulcer. He has been tolerating the dressing changes without complication. With that being said the Wound VAC he states does have to be replaced almost every day or at least reinforced unfortunately. Patient actually has his CT scan later this morning we should have the results by tomorrow. 07/05/18 on evaluation today patient presents for follow-up concerning his right Ischial ulcer. He did see the surgeon Dr. Lysle Pearl last week. They were actually very happy with him and felt like he spent a tremendous amount of time with them as far as discussing his situation was concerned. In the end Dr. Lysle Pearl did contact me as well and determine that he would not recommend any surgical  intervention at this point as he felt like it would not be in the patient's best interest based on what he was seeing. He recommended a referral to infectious disease. Subsequently this is something that Dr. Ines Bloomer office is working on setting up for the patient. As far as evaluation today is concerned the patient's wound actually appears to be worse at this point. I am concerned about how things are progressing and specifically about infection. I do not feel like it's the deeper but the area of depth is definitely widening which does have me concerned. No fevers, chills, nausea, or vomiting noted at this time. I think that we do need initiate antibiotic therapy the patient has an allow allergy to amoxicillin/penicillin he states that he gets a rash since childhood. Nonetheless she's never had the issues with Catholics or cephalosporins in general but he is aware of. 07/27/18 on evaluation today patient presents following admission to the hospital on 07/09/18. He was subsequently discharged on 07/20/18. On 07/15/18 the patient underwent irrigation and debridement was soft tissue biopsy and bone biopsy as well  as placement of a Wound VAC in the OR by Dr. Celine Ahr. During the hospital course the patient was placed on a Wound VAC and recommended follow up with surgery in three weeks actually with Dr. Delaine Lame who is infectious disease. The patient was on vancomycin during the hospital course. 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 He also had a bone culture which revealed evidence of methicillin-resistant staph aureus. He is updated CT scan 07/09/18 reveals that he had progression of the which was performed on wound to breakdown down to the trochanter where he actually had irregularities there as well suggestive of osteomyelitis. This was a change just since 9 December when we last performed a CT scan. Obviously this one had gone downhill quite significantly and  rapidly. At this point upon evaluation I feel like in general the patient's wound seems to be doing fairly well all things considered upon my evaluation today. Obviously this is larger and deeper than what I previously evaluated but at the same time he seems to be making some progress as far as the appearance of the granulation tissue is concerned. I'm happy in that regard. No fevers, chills, nausea, or vomiting noted at this time. He is on IV vancomycin and Rocephin at the facility. He is currently in [employer]. Patient History KEROLOS, NEHME ([id number]) Information obtained from Patient. Family History Hypertension - Father, Stroke - Mother, No family history of Cancer, Diabetes, Heart Disease, Kidney Disease, Lung Disease, Seizures, Thyroid Problems, Tuberculosis. Social History Never smoker, Marital Status - Married, Alcohol Use - Never, Drug Use - No History, Caffeine Use - Daily. Medical And Surgical History Notes Oncologic Prostate cancer- currently treated with horomone therapy Review of Systems (ROS) Constitutional Symptoms (General Health) Denies complaints or symptoms of Fever, Chills. Respiratory The patient has no complaints or symptoms. Cardiovascular The patient has no complaints or symptoms. Psychiatric The patient has no complaints or symptoms. Objective Constitutional Well-nourished and well-hydrated in no acute distress. Vitals Time Taken: 8:56 AM, Height: 73 in, Weight: 210 lbs, BMI: 27.7, Temperature: 98.2 F, Pulse: 99 bpm, Respiratory Rate: 18 breaths/min, Blood Pressure: 159/77 mmHg. Respiratory normal breathing without difficulty. clear to auscultation bilaterally. Cardiovascular regular rate and rhythm with normal S1, S2. Psychiatric this patient is able to make decisions and demonstrates good insight into disease process. Alert and Oriented x 3. pleasant and cooperative. General Notes: On evaluation today patient's wound bed shows  good granulation. I do not feeling the obvious exposed bone on evaluation at this time. The wound is significantly larger and deeper than what I previously saw but nonetheless I do feel like she's making good progress which is great news. Integumentary (Hair, Skin) Wound #1 status is Open. Original cause of wound was Pressure Injury. The wound is located on the Right Gluteal fold. The wound measures 8.2cm length x 2cm width x 5cm depth; 12.881cm^2 area and 64.403cm^3 volume. There is Fat ILDEFONSO, KEANEY. ([id number]) (Subcutaneous Tissue) Exposed exposed. There is no undermining noted, however, there is tunneling at 2:00 with a maximum distance of 6.2cm. There is a medium amount of serous drainage noted. The wound margin is flat and intact. There is medium (34-66%) red, pink granulation within the wound bed. There is a medium (34-66%) amount of necrotic tissue within the wound bed including Eschar and Adherent Slough. The periwound skin appearance exhibited: Excoriation, Maceration, Erythema. The periwound skin appearance did not exhibit: Callus, Crepitus, Induration, Rash, Scarring, Dry/Scaly, Atrophie Blanche, Cyanosis,  Ecchymosis, Hemosiderin Staining, Mottled, Pallor, Rubor. The surrounding wound skin color is noted with erythema which is circumferential. Periwound temperature was noted as No Abnormality. 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 Primary Wound Dressing: Wound #1 Right Gluteal fold: Silver Alginate - in clinic until SNF restarts  NPWT Secondary Dressing: Wound #1 Right Gluteal fold: Boardered Foam Dressing Dressing Change Frequency: Wound #1 Right Gluteal fold: Change Dressing Monday, Wednesday, Friday - and as needed Follow-up Appointments: Wound #1 Right Gluteal fold: Return Appointment in 1 week. Off-Loading: Wound #1 Right Gluteal fold: Mattress - Please continue air mattress at SNF Turn and reposition every 2 hours Negative Pressure Wound Therapy: Wound #1 Right Gluteal fold: Wound VAC settings at 125/130 mmHg continuous pressure. Use BLACK/GREEN foam to wound cavity. Use WHITE foam to fill any tunnel/s and/or undermining. Change VAC dressing 3 X WEEK. Change canister as indicated when full. Nurse may titrate settings and frequency of dressing changes as clinically indicated. - white foam to any tunnels Home Health Nurse may d/c VAC for s/s of increased infection, significant wound regression, or uncontrolled drainage. [place] at [phone]. ERLE, GUSTER ([id number]) Number of foam/gauze pieces used in the dressing = Medications-please add to medication list.: Wound #1 Right Gluteal fold: Other: - IV antibiotics My suggestion currently is gonna be that we actually continue with Wound VAC therapy and obviously the patient will continue with the IV antibiotics, vancomycin and Rocephin, at [place]. Subsequently we did discuss the possibility of several things including HBO therapy, plastic surgery referral to [facility] for a second opinion, and obviously the natural progression with allowing this to heal with a Wound VAC. Nonetheless for the time being obviously the Wound VAC therapy along with the IV antibiotics I think is going to be the necessary thing. We will subsequently see were things stand at follow-up. If anything changes he will let me know. Please see above for specific wound care orders. We will see patient for re-evaluation in 1 week(s) here in the clinic.  If anything worsens or changes patient will contact our office for additional recommendations. Electronic Signature(s) Signed: 07/28/2018 8:35:34 AM By: Worthy Keeler [name] Entered By: Worthy Keeler on 07/27/2018 10:11:35 DARRY, KELNHOFER ([id number]) -------------------------------------------------------------------------------- ROS/PFSH Details Patient Name: [name] Date of Service: 07/27/2018 9:15 AM Medical Record Number: [id number] Patient Account Number: [id number] Date of Birth/Sex: 09-29-1950 (67 y.o. M) Treating RN: Montey Hora Primary Care Provider: [name] Other Clinician: Referring Provider: [name] Treating Provider/Extender: [name], HOYT Weeks in Treatment: 16 Information Obtained From Patient Wound History Do you currently have one or more open woundso Yes How many open wounds do you currently haveo 1 Approximately how long have you had your woundso 1 month How have you been treating your wound(s) until nowo cream Has your wound(s) ever healed and then re-openedo No Have you had any lab work done in the past montho No Have you tested positive for an antibiotic resistant organism (MRSA, VRE)o No Have you tested positive for osteomyelitis (bone infection)o No Have you had any tests for circulation on your legso No Constitutional Symptoms (General  Health) Complaints and Symptoms: Negative for: Fever; Chills Eyes Medical History: Positive for: Cataracts - both removed Negative for: Glaucoma; Optic Neuritis Ear/Nose/Mouth/Throat Medical History: Negative for: Chronic sinus problems/congestion; Middle ear problems Hematologic/Lymphatic Medical History: Negative for: Anemia; Hemophilia; Human Immunodeficiency Virus; Lymphedema Respiratory Complaints and Symptoms: No Complaints or Symptoms Medical History: Negative for: Aspiration; Asthma; Chronic Obstructive Pulmonary Disease (COPD); Pneumothorax; Sleep  Apnea; Tuberculosis Cardiovascular Complaints and Symptoms: No Complaints or Symptoms Medical History: DEVESH, MONFORTE ([id number]) Positive for: Hypertension - takes medication Negative for: Angina; Arrhythmia; Congestive Heart Failure; Coronary Artery Disease; Deep Vein Thrombosis; Hypotension; Myocardial Infarction; Peripheral Arterial Disease; Peripheral Venous Disease; Phlebitis; Vasculitis Gastrointestinal Medical History: Negative for: Cirrhosis ; Colitis; Crohnos; Hepatitis A; Hepatitis B; Hepatitis C Endocrine Medical History: Negative for: Type I Diabetes; Type II Diabetes Genitourinary Medical History: Negative for: End Stage Renal Disease Immunological Medical History: Negative for: Lupus Erythematosus; Raynaudos; Scleroderma Integumentary (Skin) Medical History: Negative for: History of Burn; History of pressure wounds Musculoskeletal Medical History: Negative for: Gout; Rheumatoid Arthritis; Osteoarthritis; Osteomyelitis Neurologic Medical History: Positive for: Paraplegia - waist down Negative for: Dementia; Neuropathy; Quadriplegia; Seizure Disorder Oncologic Medical History: Negative for: Received Chemotherapy; Received Radiation Past Medical History Notes: Prostate cancer- currently treated with horomone therapy Psychiatric Complaints and Symptoms: No Complaints or Symptoms Medical History: Negative for: Anorexia/bulimia; Confinement Anxiety HBO Extended History Items Eyes: Cataracts SHAWNDELL, SCHILLACI. ([id number]) Immunizations Pneumococcal Vaccine: Received Pneumococcal Vaccination: No Implantable Devices Family and Social History Cancer: No; Diabetes: No; Heart Disease: No; Hypertension: Yes - Father; Kidney Disease: No; Lung Disease: No; Seizures: No; Stroke: Yes - Mother; Thyroid Problems: No; Tuberculosis: No; Never smoker; Marital Status - Married; Alcohol Use: Never; Drug Use: No History; Caffeine Use: Daily; Advanced Directives: Yes  (Copy provided); Patient does not want information on Advanced Directives; Living Will: Yes (Copy provided) Physician Affirmation I have reviewed and agree with the above information. Electronic Signature(s) Signed: 07/27/2018 4:56:07 PM By: Montey Hora Signed: 07/28/2018 8:35:34 AM By: Worthy Keeler [name] Entered By: Worthy Keeler on 07/27/2018 10:10:17 WALDEMAR, SIEGEL ([id number]) -------------------------------------------------------------------------------- SuperBill Details Patient Name: [name] Date of Service: 07/27/2018 Medical Record Number: [id number] Patient Account Number: [id number] Date of Birth/Sex: [birth year] (67 y.o. 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, sequela I10 Essential (primary) hypertension Facility Procedures CPT4 Code: 87681157 Description: 99213 - WOUND CARE VISIT-LEV 3 EST PT Modifier: Quantity: 1 Physician Procedures CPT4 Code: 2620355 Description: 97416 - WC PHYS LEVEL 4 - EST PT ICD-10 Diagnosis Description L89.314 Pressure ulcer of right buttock, stage 4 L03.317 Cellulitis of buttock S34.109S Unspecified injury to unspecified level of lumbar spinal cor G82.20 Paraplegia, unspecified Modifier: d, sequela Quantity: 1 Electronic Signature(s) Signed: 07/28/2018 8:35:34 AM By: Worthy Keeler [name] Entered By: Worthy Keeler on 07/27/2018 10:11:48

## 2018-08-02 ENCOUNTER — Ambulatory Visit: Payer: Medicare Other | Admitting: Physician Assistant

## 2018-08-03 ENCOUNTER — Encounter: Payer: Medicare Other | Admitting: Physician Assistant

## 2018-08-03 ENCOUNTER — Ambulatory Visit: Payer: Medicare Other | Admitting: Physician Assistant

## 2018-08-03 DIAGNOSIS — L89314 Pressure ulcer of right buttock, stage 4: Secondary | ICD-10-CM | POA: Diagnosis not present

## 2018-08-04 ENCOUNTER — Other Ambulatory Visit: Payer: Self-pay

## 2018-08-04 ENCOUNTER — Ambulatory Visit (INDEPENDENT_AMBULATORY_CARE_PROVIDER_SITE_OTHER): Payer: Medicare Other | Admitting: General Surgery

## 2018-08-04 ENCOUNTER — Encounter: Payer: Self-pay | Admitting: General Surgery

## 2018-08-04 VITALS — BP 137/74 | HR 87 | Temp 97.7°F | Resp 18 | Ht 73.0 in | Wt 190.0 lb

## 2018-08-04 DIAGNOSIS — L89154 Pressure ulcer of sacral region, stage 4: Secondary | ICD-10-CM | POA: Insufficient documentation

## 2018-08-04 NOTE — Progress Notes (Signed)
[name] returns to clinic today for follow-up after his recent hospitalization.  Is a 68 year old man with paraplegia secondary to a spinal surgery that did not go well.  He presented to the emergency department at [facility] on 09 July 2018.  He was found to have worsening of his known stage IV pressure ulcer.  There was concern for osteomyelitis.  He underwent surgical debridement on 26 December.  He was found to have osteomyelitis on bone biopsy as well as MRSA infection from deep tissue cultures.  He was placed on vancomycin and a wound VAC was applied to the ulcer.  He has been in a skilled nursing facility since his discharge.  He is following up in the wound care center and was seen on Monday or Tuesday of this week.  Overall, he and his family feel like the nursing staff are doing a good job with his wound care.  They do report that he has what appears to be cutaneous candidiasis (diaper rash).  He was placed on oral Diflucan at the wound care clinic this week.  He is engaging in PT at the SNF and feels like he is doing well.  Past Medical History:  Diagnosis Date  . AVM (arteriovenous malformation) spine   . Cancer [facility])    prostate, taking Casodex (hormone chemotherapy)  . History of kidney stones    several times  . Hypertension   . 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 Prostate cancer ([place])   . Status post insertion of intrathecal baclofen pump   . Status post insertion of spinal cord stimulator    Past Surgical History:  Procedure Laterality Date  . COLONOSCOPY    . CYSTOSCOPY     several times  . CYSTOSCOPY WITH DIRECT VISION INTERNAL URETHROTOMY  05/05/2017   Procedure: CYSTOSCOPY WITH DIRECT VISION INTERNAL URETHROTOMY;  Surgeon: Royston Cowper, MD;  Location: [facility];  Service: Urology;;  . Consuela Mimes WITH LITHOLAPAXY N/A 05/05/2017   Procedure: CYSTOSCOPY WITH LITHOLAPAXY;  Surgeon: Royston Cowper, MD;  Location: [facility]  ORS;  Service: Urology;  Laterality: N/A;  . EXTRACORPOREAL SHOCK WAVE LITHOTRIPSY    . EYE SURGERY Bilateral    cataract surgery  . HOLMIUM LASER APPLICATION N/A 95/63/8756   Procedure: HOLMIUM LASER APPLICATION;  Surgeon: Royston Cowper, MD;  Location: [facility] ORS;  Service: Urology;  Laterality: N/A;  . [place]  . LUMBAR [place] SURGERY  2005   resulted in paralysis of lower extrems  . SKIN DEBRIDEMENT Right 07/15/2018   Procedure: DEBRIDEMENT SKIN FULL THICKNESS--stage 4 decubitus ulcer;  Surgeon: Fredirick Maudlin, MD;  Location: [facility] ORS;  Service: General;  Laterality: Right;   No family history on file. Social History   Tobacco Use  . Smoking status: Never Smoker  . Smokeless tobacco: Former Systems developer    Types: Snuff  Substance Use Topics  . Alcohol use: No  . Drug use: No   Current Meds  Medication Sig  . acetaminophen (TYLENOL) 500 MG tablet Take 1,000 mg by mouth 2 (two) times daily.  . baclofen (LIORESAL) 10 MG tablet Take 10 mg by mouth daily as needed for muscle spasms.   . bicalutamide (CASODEX) 50 MG tablet Take 50 mg by mouth daily.  . cefTRIAXone (ROCEPHIN) IVPB Inject 2 g into the vein daily for 28 days. Indication:  Osteomyelitis of ischial bone Last Day of Therapy:  08/18/2018 Labs - Twice weekly on Mondays and Thursdays:  BMP, CBC, LFT, ESR Please pull PIC at completion of IV antibiotics  . collagenase (SANTYL) ointment Apply topically daily.  . Ferrous Sulfate (IRON) 325 (65 Fe) MG TABS Take 325 mg by mouth daily.  Marland Kitchen gabapentin (NEURONTIN) 800 MG tablet Take 1 tablet (800 mg total) by mouth 3 (three) times daily.  Marland Kitchen losartan (COZAAR) 100 MG tablet Take 100 mg by mouth daily.  . methenamine (HIPREX) 1 g tablet Take 1 g by mouth 3 (three) times daily.   . mirabegron ER (MYRBETRIQ) 50 MG TB24 tablet Take 50 mg by mouth daily.   . nutrition supplement, JUVEN, (JUVEN) PACK Take 1 packet by mouth 2 (two) times daily between meals.  Marland Kitchen oxybutynin (DITROPAN-XL)  10 MG 24 hr tablet Take 10 mg by mouth every morning.   Marland Kitchen oxyCODONE (OXY IR/ROXICODONE) 5 MG immediate release tablet Take 1-2 tablets (5-10 mg total) by mouth every 6 (six) hours as needed for breakthrough pain.  Marland Kitchen PAIN MANAGEMENT INTRATHECAL, IT, PUMP 1 each by Intrathecal route continuous. Intrathecal (IT) medication:  Baclofen, Dilaudid  . polyethylene glycol powder (GLYCOLAX/MIRALAX) powder Take 17 g by mouth daily.   Marland Kitchen Propylene Glycol (SYSTANE BALANCE) 0.6 % SOLN Place 1 drop into both eyes daily as needed (dry eyes).  . vancomycin IVPB Inject 1,250 mg into the vein every 12 (twelve) hours for 28 days. Indication:  Osteomyelitis of ischial bone Last Day of Therapy:  08/18/2018 Labs - Twice weekly on Mondays and Thursdays:  BMP(baseline SCr 0.6), CBC, LFT, ESR  Labs - Once weekly on Mondays:  Vancomycin trough (goal 15-20 mcg/mL) Please pull PIC at completion of IV antibiotics  . vitamin C (ASCORBIC ACID) 500 MG tablet Take 1,000 mg by mouth 3 (three) times daily.    Allergies  Allergen Reactions  . Clindamycin Hives  . Doxycycline Rash  . Penicillins Rash  . Sulfamethoxazole Rash   Vitals:   08/04/18 1339  BP: 137/74  Pulse: 87  Resp: 18  Temp: 97.7 F (36.5 C)  SpO2: 96%   Gen: A&O x 3. NAD.  Pulm: normal WOB on RA Wound: The [facility] dressing had been removed by the skilled nursing facility in anticipation of his clinic visit today.  There was a gauze dressing applied.  The ABD pad had a small amount of serosanguineous drainage on it.  The gauze packing was removed.  There was no odor identified.  There was a small amount of fibrinous exudate present.  No purulent drainage.  The tunnel extending cranially from the wound has closed up considerably.  Previously, it was about 8 or 9 cm; today it is closer to 5.  There is good granulation tissue present.  The skin surrounding the wound however, is red and excoriated, consistent with cutaneous candidiasis.  See  photos:     Assessment and plan: [name] is a 68 year old paraplegic male who developed a stage IV pressure ulcer.  He is doing well after his debridement and I feel like the wound VAC is having the desired effect.  There has been good healing and closure of the site since his discharge.  He should continue wound VAC treatment.  I have recommended that the nursing staff apply the minimal amount of adhesive drape required to maintain suction.  Hopefully, this will limit the amount of moisture buildup on his skin.  I have also recommended that they apply a topical nystatin product 3 times a day to the surrounding erythematous skin.  I leave he is scheduled  to remain on IV antibiotics until the end of this month.  I will copy Dr. Delaine Lame on this note, so that she may see the wound and adjust her antibiotic regimen plan accordingly.  I would like to see [name] back in about 1 month's time to reevaluate his wound.

## 2018-08-04 NOTE — Patient Instructions (Addendum)
Facility to use minimal amount of adhesive dressing when applying wound vac, avoid covering irritated skin as possible. Once wound vac in place apply nystatin powder or ointment to area of cutaneous candidiasis, TID.

## 2018-08-04 NOTE — Progress Notes (Signed)
[name], [name] ([id number]) Visit Report for 08/03/2018 Chief Complaint Document Details Patient Name: [name], [name] Date of Service: 08/03/2018 9:00 AM Medical Record Number: [id number] Patient Account Number: [id number] Date of Birth/Sex: 04-23-1951 (68 y.o. M) Treating RN: Montey Hora Primary Care Provider: [name] Other Clinician: Referring Provider: [name] Treating Provider/Extender: [name], Sharry Beining Weeks in Treatment: 35 Information Obtained from: Patient Chief Complaint Right gluteal fold ulcer Electronic Signature(s) Signed: 08/03/2018 5:17:02 PM By: Worthy Keeler [name] Entered By: Worthy Keeler on 08/03/2018 09:32:00 [name], [name] ([id number]) -------------------------------------------------------------------------------- Debridement Details Patient Name: [name] Date of Service: 08/03/2018 9:00 AM Medical Record Number: [id number] Patient Account Number: [id number] Date of Birth/Sex: 21-Jan-1951 (68 y.o. M) Treating RN: Montey Hora Primary Care Provider: [name] Other Clinician: Referring Provider: [name] Treating Provider/Extender: [name], Haji Delaine Weeks in Treatment: 35 Debridement Performed for Wound #1 Right Gluteal fold Assessment: Performed By: Physician [name], Keyonta Madrid E., [name] Debridement Type: Debridement Level of Consciousness (Pre- Awake and Alert procedure): Pre-procedure Verification/Time Yes - 09:53 Out Taken: Start Time: 09:53 Pain Control: Lidocaine 4% Topical Solution Total Area Debrided (L x W): 6.5 (cm) x 3.4 (cm) = 22.1 (cm) Tissue and other material Viable, Non-Viable, Slough, Subcutaneous, Slough debrided: Level: Skin/Subcutaneous Tissue Debridement Description: Excisional Instrument: Curette Bleeding: Minimum Hemostasis Achieved: Pressure End Time: 09:56 Procedural Pain: 0 Post Procedural Pain: 0 Response to Treatment: Procedure was tolerated well Level of  Consciousness Awake and Alert (Post-procedure): Post Debridement Measurements of Total Wound Length: (cm) 6.5 Stage: Category/Stage IV Width: (cm) 3.4 Depth: (cm) 3.6 Volume: (cm) 62.486 Character of Wound/Ulcer Post Improved Debridement: Post Procedure Diagnosis Same as Pre-procedure Electronic Signature(s) Signed: 08/03/2018 5:17:02 PM By: Worthy Keeler [name] Signed: 08/03/2018 5:22:36 PM By: Montey Hora Entered By: Montey Hora on 08/03/2018 09:57:18 [name], [name] ([id number]) -------------------------------------------------------------------------------- HPI Details Patient Name: [name] Date of Service: 08/03/2018 9:00 AM Medical Record Number: [id number] Patient Account Number: [id number] Date of Birth/Sex: 10/14/1950 (68 y.o. 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 He states that there is no history of diabetes that he is aware of. He does have issues with his prostate and is currently receiving treatment for this by way of oral medication. With that being said I do not have a lot of details in that regard. Nonetheless the patient presents today as a result of having been referred to [place] by another provider initially home health was set to come out and take care of his wound although due to the fact that he apparently drives he's not able to receive home health. His wife is therefore trying to help take care of this wound within although they have been struggling with what exactly to do at this point.  She states that she can do some things but she is definitely not a nurse and does have some issues with looking at blood. The good news is the wound does not appear to be too deep and is fairly superficial at this point. There is no slough noted there is some nonviable skin noted around the surface  of the wound and the perimeter at this point. The central portion of the wound appears to be very good with a dermal layer noted this does not appear to be again deep enough to extend it to subcutaneous tissue at this point. 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 In the pump he has Dilaudid and baclofen. 12/07/17 on evaluation today patient presents for follow-up concerning his ongoing lower back thigh ulcer on the right. He states that he did not get the supplies ordered and therefore has not really been able to perform the dressing changes as directed exactly. His wife was able to get some Boarder Foam Dressing's from the drugstore and subsequently has been using hydrogel which did help to a degree in the wound does appear to be able smaller. There is actually more drainage this week noted than previous. 12/21/17 on evaluation today patient appears to be doing rather well in regard to his right gluteal ulcer. He has been tolerating the dressing changes without complication. There does not appear to be any evidence of infection at this point in time. Overall the wound does seem to be making some progress as far as the edges are concerned there's not as much in the way of overlapping of the external wound edges and he has a good epithelium to wound bed border for the most part. This however is not true right at the 12 o'clock location over the span of a little over a centimeters which actually will require debridement today to clean this away and hopefully allow it to continue to heal more  appropriately. 12/28/17 on evaluation today patient appears to be doing rather well in regard to his ulcer in the left gluteal region. He's been tolerating the dressing changes without complication. Apparently he has had some difficulty getting his dressing material. Apparently there's been some confusion with ordering we're gonna check into this. Nonetheless overall he's been showing signs of improvement which is good news. Debridement is not required today. 01/04/18 on evaluation today patient presents for follow-up concerning his right gluteal ulcer. He has been tolerating the dressing changes fairly well. On inspection today it appears he may actually have some maceration them concerned about the fact that he may be developing too much moisture in and around the wound bed which can cause delay in healing. With that being said he unfortunately really has not showed significant signs of improvement since last week's evaluation in fact this may even be just the little bit/slightly larger. Nonetheless he's been having a lot of discomfort I'm not sure this is even related to the wound as he has no pain when I'm to breeding or otherwise cleaning the wound during evaluation today. Nonetheless this is something that we did recommend he talked to his pain specialist concerning. 01/11/18 on evaluation today patient appears to be doing better in regard to his ulceration. He has been tolerating the dressing changes without complication. With that being said overall there's no evidence of infection which is good news. The only thing is he did receive the hatch affair blue classic versus the ready nonetheless I feel like this is perfectly fine and appears to have done well for him over the past week. 01/25/18 on evaluation today patient's wound actually appears to be a little bit larger than during the last evaluation. The good [name], DEROY. ([id number]) news is the majority of  the wound edges actually  appear to be fairly firmly attached to the wound bed unfortunately again we're not really making progress in regard to the size. Roughly the wound is about the same size as when I first saw him although again the wound margin/edges appear to be much better. 02/01/18 on evaluation today patient actually appears to be doing very well in regard to his wound. Applying the Prisma dry does seem to be better although he does still have issues with slow progression of the wound. There was a slight improvement compared to last week's measurements today. Nonetheless I have been considering other options as far as the possibility of Theraskin or even a snap vac. In general I'm not sure that the Theraskin due to location of the wound would be a very good idea. Nonetheless I do think that a snap vac could be a possibility for the patient and in fact I think this could even be an excellent way to manage the wound possibly seeing some improvement in a very rapid fashion here. Nonetheless this is something that we would need to get approved and I did have a lengthy conversation with the patient about this today. 02/08/18 on evaluation today patient appears to be doing a little better in regard to his ulcer. He has been tolerating the dressing changes without complication. Fortunately despite the fact that the wound is a little bit smaller it's not significantly so unfortunately. We have discussed the possibility of a snap vac we did check with insurance this is actually covered at this point. Fortunately there does not appear to be any sign of infection. Overall I'm fairly pleased with how things seem to be appearing at this point. 02/15/18 on evaluation today patient appears to be doing rather well in regard to his right gluteal ulcer. Unfortunately the snap vac did not stay in place with his sheer and friction this came loose and did not seem to maintain seal very well. He worked for about two days and it did seem  to do very well during that time according to his wife but in general this does not seem to be something that's gonna be beneficial for him long-term. I do believe we need to go back to standard dressings to see if we can find something that will be of benefit. 03/02/18- He is here in follow up evaluation; there is minimal change in the wound. He will continue with the same treatment plan, would consider changing to iodosrob/iodoflex if ulcer continues to to plateau. He will follow up next week 03/08/18 on evaluation today patient's wound actually appears to be about the same size as when I previously saw him several weeks back. Unfortunately he does have some slightly dark discoloration in the central portion of the wound which has me concerned about pressure injury. I do believe he may be sitting for too long a period of time in fact he tells me that "I probably sit for much too long". He does have some Slough noted on the surface of the wound and again as far as the size of the wound is concerned I'm really not seeing anything that seems to have improved significantly. 03/15/18 on evaluation today patient appears to be doing fairly well in regard to his ulcer. The wound measured pretty much about the same today compared to last week's evaluation when looking at his graph. With that being said the area of bruising/deep tissue injury that was noted last week I do not see  at this point. He did get a new cushion fortunately this does seem to be have been of benefit in my pinion. It does appear that he's been off of this more which is good news as well I think that is definitely showing in the overall wound measurements. With that being said I do believe that he needs to continue to offload I don't think that the fact this is doing better should be or is going to allow him to not have to offload and explain this to him as well. Overall he seems to be in agreement the plan I think he understands. The  overall appearance of the wound bed is improved compared to last week I think the Iodoflex has been beneficial in that regard. 03/29/18 on evaluation today patient actually appears to be doing rather well in regard to his wound from the overall appearance standpoint he does have some granulation although there's some Slough on the surface of the wound noted as well. With that being said he unfortunately has not improved in regard to the overall measurement of the wound in volume or in size. I did have a discussion with him very specifically about offloading today. He actually does work although he mainly is just sitting throughout the day. He tells me he offloads by "lifting himself up for 30 seconds off of his chair occasionally" purchase from [employer] which does seem to have helped. And he has a new cushion that he with that being said he's also able to stand some for a very short period of time but not significant enough I think to provide appropriate offloading. I think the biggest issue at this point with the wound and the fact is not healing as quickly as we would like is due to the fact that he is really not able to appropriately offload while at work. He states the beginning after his injury he actually had a bed at his job that he could lay on in order to offload and that does seem to have been of help back at that time. Nonetheless he had not done this in quite some time unfortunately. I think that could be helpful for him this is something I would like for him to look into. 04/05/18 on evaluation today patient actually presents for follow-up concerning his right gluteal ulcer. Again he really is not significantly improved even compared to last week. He has been tolerating the dressing changes without complication. With that being said fortunately there appears to be no evidence of infection at this time. He has been more proactive in trying to offload. [name], [name]  ([id number]) 04/12/18 on evaluation today patient actually appears to be doing a little better in regard to his wound and the right gluteal fold region. He's been tolerating the dressing changes since removing the oasis without complication. However he was having a lot of burning initially with the oasis in place. He's unsure of exactly why this was given so much discomfort but he assumes that it was the oasis itself causing the problem. Nonetheless this had to be removed after about three days in place although even those three days seem to have made a fairly good improvement in regard to the overall appearance of the wound bed. In fact is the first time that he's made any improvement from the standpoint of measurements in about six weeks. He continues to have no discomfort over the area of the wound itself which leads me to wonder why he  was having the burning with the oasis when he does not even feel the actual debridement's themselves. I am somewhat perplexed by this. 04/19/18 on evaluation today patient's wound actually appears to be showing signs of epithelialization around the edge of the wound and in general actually appears to be doing better which is good news. He did have the same burning after about three days with applying the Endoform last week in the same fashion that I would generally apply a skin substitute. This seems to indicate that it's not the oasis to cause the problem but potentially the moisture buildup that just causes things to burn or there may be some other reaction with the skin prep or Steri-Strips. Nonetheless I'm not sure that is gonna be able to tolerate any skin substitute for a long period of time. The good news is the wound actually appears to be doing better today compared to last week and does seem to finally be making some progress. 04/26/18 on evaluation today patient actually appears to be doing rather well in regard to his ulcer in the right gluteal fold.  He has been tolerating the dressing changes without complication which is good news. The Endoform does seem to be helping although he was a little bit more macerated this week. This seems to be an ongoing issue with fluid control at this point. Nonetheless I think we may be able to add something like Drawtex to help control the drainage. 05/03/18 on evaluation today patient appears to actually be doing better in regard to the overall appearance of his wound. He has been tolerating the dressing changes without complication. Fortunately there appears to be no evidence of infection at this time. I really feel like his wound has shown signs as of today of turning around last week I thought so as well and definitely he could be seen in this week's overall appearance and measurements. In general I'm very pleased with the fact that he finally seems to be making a steady but sure progress. The patient likewise is very pleased. 05/17/18 on evaluation today patient appears to be doing more poorly unfortunately in regard to his ulcer. He has been tolerating the dressing changes without complication. With that being said he tells me that in the past couple of days he and his wife have noticed that we did not seem to be doing quite as well is getting dark near the center. Subsequently upon evaluation today the wound actually does appear to be doing worse compared to previous. He has been tolerating the dressing changes otherwise and he states that he is not been sitting up anymore than he was in the past from what he tells me. Still he has continued to work he states "I'm tired of dealing with this and if I have to just go home and lay in the bed all the time that's what I'll do". Nonetheless I am concerned about the fact that this wound does appear to be deeper than what it was previous. 05/24/18 upon evaluation today patient actually presents after having been in the hospital due to what was presumed to  be sepsis secondary to the wound infection. He had an elevated white blood cell count between 14 and 15. With that being said he does seem to be doing somewhat better now. His wound still is giving him some trouble nonetheless and he is obviously concerned about the fact likely talked about that this does seem to go more deeply than previously noted. I did  review his wound culture which showed evidence of Staphylococcus aureus him and group B strep. Nonetheless he is on antibiotics, Levaquin, for this. Subsequently I did review his intake summary from the hospital as well. I also did look at the CT of the lumbar spine with contrast that was performed which showed no bone destruction to suggest lumbar disguises/osteomyelitis or sacral osteomyelitis. There was no paraspinal abscess. Nonetheless it appears this may have been more of just a soft tissue infection at this point which is good news. He still is nonetheless concerned about the wound which again I think is completely reasonable considering everything he's been through recently. 05/31/18 on evaluation today on evaluation today patient actually appears to be showing signs of his wound be a little bit deeper than what I would like to see. Fortunately he does not show any signs of significant infection although his temperature was 99 today he states he's been checking this at home and has not been elevated. Nonetheless with the undermining that I'm seeing at this point I am becoming more concerned about the wound I do think that offloading is a key factor here that is preventing the speedy recovery at this point. There does not appear to be any evidence of again over infection noted. He's been using Santyl currently. 06/07/18 the patient presents today for follow-up evaluation regarding the left ulcer in the gluteal region. He has been tolerating the Wound VAC fairly well. He is obviously very frustrated with this he states that to mean is really  getting in his way. There does not appear to be any evidence of infection at this time he does have a little bit of odor I do not necessarily associate this with infection just something that we sometimes notice with Wound VAC therapy. With that being said I can definitely catch a tone of discontentment overall in the patient's demeanor today. This when he was previously in the hospital [name], ERHARDT. ([id number]) an CT scan was done of the lumbar region which did not reveal any signs of osteomyelitis. With that being said the pelvis in particular was not evaluated distinctly which means he could still have some osteonecrosis I. Nonetheless the Wound VAC was started on Thursday I do want to get this little bit more time before jumping to a CT scan of the pelvis although that is something that I might would recommend if were not see an improvement by that time. 06/14/18 on evaluation today patient actually appears to be doing about the same in regard to his right gluteal ulcer. Again he did have a CT scan of the lumbar spine unfortunately this did not include the pelvis. Nonetheless with the depth of the wound that I'm seeing today even despite the fact that I'm not seeing any evidence of overt cellulitis I believe there's a good chance that we may be dealing with osteomyelitis somewhere in the right Ischial region. No fevers, chills, nausea, or vomiting noted at this time. 06/21/18 on evaluation today patient actually appears to be doing about the same with regard to his wound. The tunnel at 6 o'clock really does not appear to be any deeper although it is a little bit wider. I think at this point you may want to start packing this with white phone. Unfortunately I have not got approval for the CT scan of the pelvis as of yet due to the fact that Medicare apparently has been denied it due to the diagnosis codes not being appropriate according to  Medicare for the test requested. With that being  said the patient cannot have an MRI and therefore this is the only option that we have as far as testing is concerned. The patient has had infection and was on antibiotics and been added code for cellulitis of the bottom to see if this will be appropriate for getting the test approved. Nonetheless I'm concerned about the infection have been spread deeper into the Ischial region. 06/28/18 on evaluation today patient actually appears to be doing rather well all things considered in regard to the right gluteal ulcer. He has been tolerating the dressing changes without complication. With that being said the Wound VAC he states does have to be replaced almost every day or at least reinforced unfortunately. Patient actually has his CT scan later this morning we should have the results by tomorrow. 07/05/18 on evaluation today patient presents for follow-up concerning his right Ischial ulcer. He did see the surgeon Dr. Lysle Pearl last week. They were actually very happy with him and felt like he spent a tremendous amount of time with them as far as discussing his situation was concerned. In the end Dr. Lysle Pearl did contact me as well and determine that he would not recommend any surgical intervention at this point as he felt like it would not be in the patient's best interest based on what he was seeing. He recommended a referral to infectious disease. Subsequently this is something that Dr. Ines Bloomer office is working on setting up for the patient. As far as evaluation today is concerned the patient's wound actually appears to be worse at this point. I am concerned about how things are progressing and specifically about infection. I do not feel like it's the deeper but the area of depth is definitely widening which does have me concerned. No fevers, chills, nausea, or vomiting noted at this time. I think that we do need initiate antibiotic therapy the patient has an allow allergy to amoxicillin/penicillin he states  that he gets a rash since childhood. Nonetheless she's never had the issues with [employer] or cephalosporins in general but he is aware of. 07/27/18 on evaluation today patient presents following admission to the hospital on 07/09/18. He was subsequently discharged on 07/20/18. On 07/15/18 the patient underwent irrigation and debridement was soft tissue biopsy and bone biopsy as well as placement of a Wound VAC in the OR by Dr. Celine Ahr. During the hospital course the patient was placed on a Wound VAC and recommended follow up with surgery in three weeks actually with Dr. Delaine Lame who is infectious disease. The patient was on vancomycin during the hospital course. 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 He also had a bone culture which revealed evidence of methicillin-resistant staph aureus. He is updated CT scan 07/09/18 reveals that he had progression of the which was performed on wound to breakdown down to the trochanter where he actually had irregularities there as well suggestive of osteomyelitis. This was a change just since 9 December when we last performed a CT scan. Obviously this one had gone downhill quite significantly and rapidly. At this point upon evaluation I feel like in general the patient's wound seems to be doing fairly well all things considered upon my evaluation today. Obviously this is larger and deeper than what I previously evaluated but at the same time he seems to be making some progress as far as the appearance of the granulation tissue is concerned. I'm happy in that regard.  No fevers, chills, nausea, or vomiting noted at this time. He is on IV vancomycin and Rocephin at the facility. He is currently in [facility]. 08/03/18 upon evaluation today patient's wound appears to be doing better in regard to the overall appearance at this point in time. Fortunately he's been tolerating the Wound VAC without complication and states that  the facility has been taking excellent care of the wound site. Overall I see some Slough noted on the surface which I am going to attempt sharp debridement today of but nonetheless other than this I feel like he's making progress. Electronic Signature(s) TURNER, BAILLIE ([id number]) Signed: 08/03/2018 5:17:02 PM By: Worthy Keeler [name] Entered By: Worthy Keeler on 08/03/2018 17:15:32 YATES, WEISGERBER ([id number]) -------------------------------------------------------------------------------- Physical Exam Details Patient Name: [name] Date of Service: 08/03/2018 9:00 AM Medical Record Number: [id number] Patient Account Number: [id number] Date of Birth/Sex: Nov 24, 1950 (68 y.o. M) Treating RN: Montey Hora Primary Care Provider: [name] Other Clinician: Referring Provider: [name] Treating Provider/Extender: [name], Catelynn Sparger Weeks in Treatment: 8 Constitutional Well-nourished and well-hydrated in no acute distress. Respiratory normal breathing without difficulty. Psychiatric this patient is able to make decisions and demonstrates good insight into disease process. Alert and Oriented x 3. pleasant and cooperative. Notes Patient's wound bed currently shows evidence of good granulation there was some Slough sharp debridement was performed post debridement this appears to be doing somewhat better. I think that he is making progress albeit slow. Electronic Signature(s) Signed: 08/03/2018 5:17:02 PM By: Worthy Keeler [name] Entered By: Worthy Keeler on 08/03/2018 17:16:04 AVARY, PITSENBARGER ([id number]) -------------------------------------------------------------------------------- Physician Orders Details Patient Name: [name] Date of Service: 08/03/2018 9:00 AM Medical Record Number: [id number] Patient Account Number: [id number] Date of Birth/Sex: 1951-02-10 (68 y.o. 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 Primary Wound Dressing Wound #1 Right Gluteal fold o Silver Alginate - in clinic until SNF restarts NPWT Secondary Dressing Wound #1 Right Gluteal fold o Boardered Foam Dressing Dressing Change Frequency Wound #1 Right Gluteal fold o Change Dressing Monday, Wednesday, Friday - and as needed Follow-up Appointments Wound #1 Right Gluteal fold o Return Appointment in 1 week. Off-Loading Wound #1 Right Gluteal fold o Mattress - Please continue air mattress at SNF o Turn and reposition every 2 hours Negative Pressure Wound Therapy Wound #1 Right Gluteal fold Frandsen, Talbot J. ([id number]) o Wound VAC settings at 125/130 mmHg continuous pressure. Use BLACK/GREEN foam to wound cavity. Use WHITE foam to fill any tunnel/s and/or undermining. Change VAC dressing 3 X WEEK. Change canister as indicated when full. Nurse may titrate settings and frequency of dressing changes as clinically indicated. - white foam to any tunnels o Home Health Nurse may d/c VAC for s/s of increased infection, significant wound regression, or uncontrolled drainage. [facility] at [phone]. o Number of foam/gauze pieces used in the dressing = Medications-please add to  medication list. Wound #1 Right  Gluteal fold o Santyl Enzymatic Ointment - Please discontinue santyl under NPWT o Other: - IV antibiotics, difucan PO Electronic Signature(s) Signed: 08/03/2018 5:17:02 PM By: Worthy Keeler [name] Signed: 08/03/2018 5:22:36 PM By: Montey Hora Entered By: Montey Hora on 08/03/2018 09:59:24 [name], [name] ([id number]) -------------------------------------------------------------------------------- Problem List Details Patient Name: [name] Date of Service: 08/03/2018 9:00 AM Medical Record Number: [id number] Patient Account Number: [id number] Date of Birth/Sex: 1951/07/07 (68 y.o. M) Treating RN: Montey Hora Primary Care Provider: [name] Other Clinician: Referring Provider: [name] Treating Provider/Extender: [name], Darion Milewski Weeks in Treatment: 35 Active Problems ICD-10 Evaluated Encounter Code Description Active Date Today Diagnosis L89.314 Pressure ulcer of right buttock, stage 4 11/30/2017 No Yes L03.317 Cellulitis of buttock 06/21/2018 No Yes G82.20 Paraplegia, unspecified 11/30/2017 No Yes S34.109S Unspecified injury to unspecified level of lumbar spinal cord, 11/30/2017 No Yes sequela I10 Essential (primary) hypertension 11/30/2017 No Yes Inactive Problems Resolved Problems Electronic Signature(s) Signed: 08/03/2018 5:17:02 PM By: Worthy Keeler [name] Entered By: Worthy Keeler on 08/03/2018 09:31:54 Guldin, [name] ([id number]) -------------------------------------------------------------------------------- Progress Note Details Patient Name: [name] Date of Service: 08/03/2018 9:00 AM Medical Record Number: [id number] Patient Account Number: [id number] Date of Birth/Sex: 09/02/1950 (68 y.o. 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 He states that there is no history of diabetes that he is aware of. He does have issues with his prostate and is currently receiving treatment for this by way of oral medication. With that being said I do not have a lot of details in that regard. Nonetheless the patient presents today as a result of having been referred to [place] by another provider initially home health was set to come out and take care of his wound although due to the fact that he apparently drives he's not able to receive home health. His wife is therefore trying to help take care of this wound within although they have been struggling with what exactly to do at this point. She states that she can do some things but she is definitely not a nurse and does have some issues with looking at blood. The good news is the wound does not appear to be too deep and is fairly superficial at this point. There is no slough noted there is some nonviable skin noted around the surface of the wound and the perimeter at this point. The central portion of the wound appears to be very good with a dermal layer noted this does not appear to be again deep enough to extend it to subcutaneous tissue at this point. 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 In the pump he has Dilaudid and baclofen. 12/07/17 on evaluation today patient presents for follow-up concerning his ongoing lower back thigh ulcer on the right. He states that he did not get the supplies ordered and therefore has not really been  able to  perform the dressing changes as directed exactly. His wife was able to get some Boarder Foam Dressing's from the drugstore and subsequently has been using hydrogel which did help to a degree in the wound does appear to be able smaller. There is actually more drainage this week noted than previous. 12/21/17 on evaluation today patient appears to be doing rather well in regard to his right gluteal ulcer. He has been tolerating the dressing changes without complication. There does not appear to be any evidence of infection at this point in time. Overall the wound does seem to be making some progress as far as the edges are concerned there's not as much in the way of overlapping of the external wound edges and he has a good epithelium to wound bed border for the most part. This however is not true right at the 12 o'clock location over the span of a little over a centimeters which actually will require debridement today to clean this away and hopefully allow it to continue to heal more appropriately. 12/28/17 on evaluation today patient appears to be doing rather well in regard to his ulcer in the left gluteal region. He's been tolerating the dressing changes without complication. Apparently he has had some difficulty getting his dressing material. Apparently there's been some confusion with ordering we're gonna check into this. Nonetheless overall he's been showing signs of improvement which is good news. Debridement is not required today. 01/04/18 on evaluation today patient presents for follow-up concerning his right gluteal ulcer. He has been tolerating the dressing changes fairly well. On inspection today it appears he may actually have some maceration them concerned about the fact that he may be developing too much moisture in and around the wound bed which can cause delay in healing. With that being said he unfortunately really has not showed significant signs of improvement since last week's  evaluation in fact this may even be just the little bit/slightly larger. Nonetheless he's been having a lot of discomfort I'm not sure this is even related to the wound as he has no pain when I'm to breeding or otherwise cleaning the wound during evaluation today. Nonetheless this is something that we did recommend he talked to his pain specialist concerning. 01/11/18 on evaluation today patient appears to be doing better in regard to his ulceration. He has been tolerating the dressing Nagele, Ami J. ([id number]) changes without complication. With that being said overall there's no evidence of infection which is good news. The only thing is he did receive the hatch affair blue classic versus the ready nonetheless I feel like this is perfectly fine and appears to have done well for him over the past week. 01/25/18 on evaluation today patient's wound actually appears to be a little bit larger than during the last evaluation. The good news is the majority of the wound edges actually appear to be fairly firmly attached to the wound bed unfortunately again we're not really making progress in regard to the size. Roughly the wound is about the same size as when I first saw him although again the wound margin/edges appear to be much better. 02/01/18 on evaluation today patient actually appears to be doing very well in regard to his wound. Applying the Prisma dry does seem to be better although he does still have issues with slow progression of the wound. There was a slight improvement compared to last week's measurements today. Nonetheless I have been considering other options as far as the possibility of Theraskin  or even a snap vac. In general I'm not sure that the Theraskin due to location of the wound would be a very good idea. Nonetheless I do think that a snap vac could be a possibility for the patient and in fact I think this could even be an excellent way to manage the wound possibly seeing some  improvement in a very rapid fashion here. Nonetheless this is something that we would need to get approved and I did have a lengthy conversation with the patient about this today. 02/08/18 on evaluation today patient appears to be doing a little better in regard to his ulcer. He has been tolerating the dressing changes without complication. Fortunately despite the fact that the wound is a little bit smaller it's not significantly so unfortunately. We have discussed the possibility of a snap vac we did check with insurance this is actually covered at this point. Fortunately there does not appear to be any sign of infection. Overall I'm fairly pleased with how things seem to be appearing at this point. 02/15/18 on evaluation today patient appears to be doing rather well in regard to his right gluteal ulcer. Unfortunately the snap vac did not stay in place with his sheer and friction this came loose and did not seem to maintain seal very well. He worked for about two days and it did seem to do very well during that time according to his wife but in general this does not seem to be something that's gonna be beneficial for him long-term. I do believe we need to go back to standard dressings to see if we can find something that will be of benefit. 03/02/18- He is here in follow up evaluation; there is minimal change in the wound. He will continue with the same treatment plan, would consider changing to iodosrob/iodoflex if ulcer continues to to plateau. He will follow up next week 03/08/18 on evaluation today patient's wound actually appears to be about the same size as when I previously saw him several weeks back. Unfortunately he does have some slightly dark discoloration in the central portion of the wound which has me concerned about pressure injury. I do believe he may be sitting for too long a period of time in fact he tells me that "I probably sit for much too long". He does have some Slough noted on  the surface of the wound and again as far as the size of the wound is concerned I'm really not seeing anything that seems to have improved significantly. 03/15/18 on evaluation today patient appears to be doing fairly well in regard to his ulcer. The wound measured pretty much about the same today compared to last week's evaluation when looking at his graph. With that being said the area of bruising/deep tissue injury that was noted last week I do not see at this point. He did get a new cushion fortunately this does seem to be have been of benefit in my pinion. It does appear that he's been off of this more which is good news as well I think that is definitely showing in the overall wound measurements. With that being said I do believe that he needs to continue to offload I don't think that the fact this is doing better should be or is going to allow him to not have to offload and explain this to him as well. Overall he seems to be in agreement the plan I think he understands. The overall appearance of the wound bed  is improved compared to last week I think the Iodoflex has been beneficial in that regard. 03/29/18 on evaluation today patient actually appears to be doing rather well in regard to his wound from the overall appearance standpoint he does have some granulation although there's some Slough on the surface of the wound noted as well. With that being said he unfortunately has not improved in regard to the overall measurement of the wound in volume or in size. I did have a discussion with him very specifically about offloading today. He actually does work although he mainly is just sitting throughout the day. He tells me he offloads by "lifting himself up for 30 seconds off of his chair occasionally" purchase from [employer] which does seem to have helped. And he has a new cushion that he with that being said he's also able to stand some for a very short period of time but not significant  enough I think to provide appropriate offloading. I think the biggest issue at this point with the wound and the fact is not healing as quickly as we would like is due to the fact that he is really not able to appropriately offload while at work. He states the beginning after his injury he actually had a bed at his job that he could lay on in order to offload and that does seem to have been of help back at that time. Nonetheless he had not done this in quite some time unfortunately. I think that could be helpful for him this is something I would like for him to look into. [name], [name] ([id number]) 04/05/18 on evaluation today patient actually presents for follow-up concerning his right gluteal ulcer. Again he really is not significantly improved even compared to last week. He has been tolerating the dressing changes without complication. With that being said fortunately there appears to be no evidence of infection at this time. He has been more proactive in trying to offload. 04/12/18 on evaluation today patient actually appears to be doing a little better in regard to his wound and the right gluteal fold region. He's been tolerating the dressing changes since removing the oasis without complication. However he was having a lot of burning initially with the oasis in place. He's unsure of exactly why this was given so much discomfort but he assumes that it was the oasis itself causing the problem. Nonetheless this had to be removed after about three days in place although even those three days seem to have made a fairly good improvement in regard to the overall appearance of the wound bed. In fact is the first time that he's made any improvement from the standpoint of measurements in about six weeks. He continues to have no discomfort over the area of the wound itself which leads me to wonder why he was having the burning with the oasis when he does not even feel the actual debridement's  themselves. I am somewhat perplexed by this. 04/19/18 on evaluation today patient's wound actually appears to be showing signs of epithelialization around the edge of the wound and in general actually appears to be doing better which is good news. He did have the same burning after about three days with applying the Endoform last week in the same fashion that I would generally apply a skin substitute. This seems to indicate that it's not the oasis to cause the problem but potentially the moisture buildup that just causes things to burn or there may be some  other reaction with the skin prep or Steri-Strips. Nonetheless I'm not sure that is gonna be able to tolerate any skin substitute for a long period of time. The good news is the wound actually appears to be doing better today compared to last week and does seem to finally be making some progress. 04/26/18 on evaluation today patient actually appears to be doing rather well in regard to his ulcer in the right gluteal fold. He has been tolerating the dressing changes without complication which is good news. The Endoform does seem to be helping although he was a little bit more macerated this week. This seems to be an ongoing issue with fluid control at this point. Nonetheless I think we may be able to add something like Drawtex to help control the drainage. 05/03/18 on evaluation today patient appears to actually be doing better in regard to the overall appearance of his wound. He has been tolerating the dressing changes without complication. Fortunately there appears to be no evidence of infection at this time. I really feel like his wound has shown signs as of today of turning around last week I thought so as well and definitely he could be seen in this week's overall appearance and measurements. In general I'm very pleased with the fact that he finally seems to be making a steady but sure progress. The patient likewise is very pleased. 05/17/18 on  evaluation today patient appears to be doing more poorly unfortunately in regard to his ulcer. He has been tolerating the dressing changes without complication. With that being said he tells me that in the past couple of days he and his wife have noticed that we did not seem to be doing quite as well is getting dark near the center. Subsequently upon evaluation today the wound actually does appear to be doing worse compared to previous. He has been tolerating the dressing changes otherwise and he states that he is not been sitting up anymore than he was in the past from what he tells me. Still he has continued to work he states "I'm tired of dealing with this and if I have to just go home and lay in the bed all the time that's what I'll do". Nonetheless I am concerned about the fact that this wound does appear to be deeper than what it was previous. 05/24/18 upon evaluation today patient actually presents after having been in the hospital due to what was presumed to be sepsis secondary to the wound infection. He had an elevated white blood cell count between 14 and 15. With that being said he does seem to be doing somewhat better now. His wound still is giving him some trouble nonetheless and he is obviously concerned about the fact likely talked about that this does seem to go more deeply than previously noted. I did review his wound culture which showed evidence of Staphylococcus aureus him and group B strep. Nonetheless he is on antibiotics, Levaquin, for this. Subsequently I did review his intake summary from the hospital as well. I also did look at the CT of the lumbar spine with contrast that was performed which showed no bone destruction to suggest lumbar disguises/osteomyelitis or sacral osteomyelitis. There was no paraspinal abscess. Nonetheless it appears this may have been more of just a soft tissue infection at this point which is good news. He still is nonetheless concerned about the  wound which again I think is completely reasonable considering everything he's been through recently. 05/31/18 on evaluation  today on evaluation today patient actually appears to be showing signs of his wound be a little bit deeper than what I would like to see. Fortunately he does not show any signs of significant infection although his temperature was 99 today he states he's been checking this at home and has not been elevated. Nonetheless with the undermining that I'm seeing at this point I am becoming more concerned about the wound I do think that offloading is a key factor here that is preventing the speedy recovery at this point. There does not appear to be any evidence of again over infection noted. He's been using Santyl currently. [name], [name] ([id number]) 06/07/18 the patient presents today for follow-up evaluation regarding the left ulcer in the gluteal region. He has been tolerating the Wound VAC fairly well. He is obviously very frustrated with this he states that to mean is really getting in his way. There does not appear to be any evidence of infection at this time he does have a little bit of odor I do not necessarily associate this with infection just something that we sometimes notice with Wound VAC therapy. With that being said I can definitely catch a tone of discontentment overall in the patient's demeanor today. This when he was previously in the hospital an CT scan was done of the lumbar region which did not reveal any signs of osteomyelitis. With that being said the pelvis in particular was not evaluated distinctly which means he could still have some osteonecrosis I. Nonetheless the Wound VAC was started on Thursday I do want to get this little bit more time before jumping to a CT scan of the pelvis although that is something that I might would recommend if were not see an improvement by that time. 06/14/18 on evaluation today patient actually appears to be doing  about the same in regard to his right gluteal ulcer. Again he did have a CT scan of the lumbar spine unfortunately this did not include the pelvis. Nonetheless with the depth of the wound that I'm seeing today even despite the fact that I'm not seeing any evidence of overt cellulitis I believe there's a good chance that we may be dealing with osteomyelitis somewhere in the right Ischial region. No fevers, chills, nausea, or vomiting noted at this time. 06/21/18 on evaluation today patient actually appears to be doing about the same with regard to his wound. The tunnel at 6 o'clock really does not appear to be any deeper although it is a little bit wider. I think at this point you may want to start packing this with white phone. Unfortunately I have not got approval for the CT scan of the pelvis as of yet due to the fact that Medicare apparently has been denied it due to the diagnosis codes not being appropriate according to Medicare for the test requested. With that being said the patient cannot have an MRI and therefore this is the only option that we have as far as testing is concerned. The patient has had infection and was on antibiotics and been added code for cellulitis of the bottom to see if this will be appropriate for getting the test approved. Nonetheless I'm concerned about the infection have been spread deeper into the Ischial region. 06/28/18 on evaluation today patient actually appears to be doing rather well all things considered in regard to the right gluteal ulcer. He has been tolerating the dressing changes without complication. With that being said the Wound VAC  he states does have to be replaced almost every day or at least reinforced unfortunately. Patient actually has his CT scan later this morning we should have the results by tomorrow. 07/05/18 on evaluation today patient presents for follow-up concerning his right Ischial ulcer. He did see the surgeon Dr. Lysle Pearl last week.  They were actually very happy with him and felt like he spent a tremendous amount of time with them as far as discussing his situation was concerned. In the end Dr. Lysle Pearl did contact me as well and determine that he would not recommend any surgical intervention at this point as he felt like it would not be in the patient's best interest based on what he was seeing. He recommended a referral to infectious disease. Subsequently this is something that Dr. Ines Bloomer office is working on setting up for the patient. As far as evaluation today is concerned the patient's wound actually appears to be worse at this point. I am concerned about how things are progressing and specifically about infection. I do not feel like it's the deeper but the area of depth is definitely widening which does have me concerned. No fevers, chills, nausea, or vomiting noted at this time. I think that we do need initiate antibiotic therapy the patient has an allow allergy to amoxicillin/penicillin he states that he gets a rash since childhood. Nonetheless she's never had the issues with Catholics or cephalosporins in general but he is aware of. 07/27/18 on evaluation today patient presents following admission to the hospital on 07/09/18. He was subsequently discharged on 07/20/18. On 07/15/18 the patient underwent irrigation and debridement was soft tissue biopsy and bone biopsy as well as placement of a Wound VAC in the OR by Dr. Celine Ahr. During the hospital course the patient was placed on a Wound VAC and recommended follow up with surgery in three weeks actually with Dr. Delaine Lame who is infectious disease. The patient was on vancomycin during the hospital course. 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 He also had a bone culture which revealed evidence of methicillin-resistant staph aureus. He is updated CT scan 07/09/18 reveals that he had progression of the which was performed on wound to  breakdown down to the trochanter where he actually had irregularities there as well suggestive of osteomyelitis. This was a change just since 9 December when we last performed a CT scan. Obviously this one had gone downhill quite significantly and rapidly. At this point upon evaluation I feel like in general the patient's wound seems to be doing fairly well all things considered upon my evaluation today. Obviously this is larger and deeper than what I previously evaluated but at the same time he seems to be making some progress as far as the appearance of the granulation tissue is concerned. I'm happy in that regard. No fevers, chills, nausea, or vomiting noted at this time. He is on IV vancomycin and Rocephin at the facility. He is currently in [facility]. 08/03/18 upon evaluation today patient's wound appears to be doing better in regard to the overall appearance at this point in time. Fortunately he's been tolerating the Wound VAC without complication and states that the facility has been taking GUAGE, EFFERSON. ([id number]) excellent care of the wound site. Overall I see some Slough noted on the surface which I am going to attempt sharp debridement today of but nonetheless other than this I feel like he's making progress. Patient History Information obtained from Patient. Family History  Hypertension - Father, Stroke - Mother, No family history of Cancer, Diabetes, Heart Disease, Kidney Disease, Lung Disease, Seizures, Thyroid Problems, Tuberculosis. Social History Never smoker, Marital Status - Married, Alcohol Use - Never, Drug Use - No History, Caffeine Use - Daily. Medical And Surgical History Notes Oncologic Prostate cancer- currently treated with horomone therapy Review of Systems (ROS) Constitutional Symptoms (General Health) Denies complaints or symptoms of Fever, Chills. Respiratory The patient has no complaints or symptoms. Cardiovascular The patient has no  complaints or symptoms. Psychiatric The patient has no complaints or symptoms. Objective Constitutional Well-nourished and well-hydrated in no acute distress. Vitals Time Taken: 9:05 AM, Height: 73 in, Weight: 210 lbs, BMI: 27.7, Temperature: 98.3 F, Pulse: 80 bpm, Respiratory Rate: 18 breaths/min, Blood Pressure: 142/62 mmHg. Respiratory normal breathing without difficulty. Psychiatric this patient is able to make decisions and demonstrates good insight into disease process. Alert and Oriented x 3. pleasant and cooperative. General Notes: Patient's wound bed currently shows evidence of good granulation there was some Slough sharp debridement was performed post debridement this appears to be doing somewhat better. I think that he is making progress albeit slow. Integumentary (Hair, Skin) Wound #1 status is Open. Original cause of wound was Pressure Injury. The wound is located on the Right Gluteal fold. The NICHOLOUS, GIRGENTI ([id number]) wound measures 6.5cm length x 3.4cm width x 3.5cm depth; 17.357cm^2 area and 60.751cm^3 volume. There is Fat Layer (Subcutaneous Tissue) Exposed exposed. There is tunneling at 2:00 with a maximum distance of 2.5cm. There is a medium amount of serous drainage noted. The wound margin is flat and intact. There is medium (34-66%) red, pink granulation within the wound bed. There is a medium (34-66%) amount of necrotic tissue within the wound bed including Adherent Slough. The periwound skin appearance exhibited: Excoriation, Rash, Erythema. The periwound skin appearance did not exhibit: Callus, Crepitus, Induration, Scarring, Dry/Scaly, Maceration, Atrophie Blanche, Cyanosis, Ecchymosis, Hemosiderin Staining, Mottled, Pallor, Rubor. The surrounding wound skin color is noted with erythema which is circumferential. Periwound temperature was noted as No Abnormality. 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 There was a Excisional Skin/Subcutaneous Tissue Debridement with a total area of 22.1 sq cm performed by [name], Krista Som E., [name]. With the following instrument(s): Curette to remove Viable and Non-Viable tissue/material. Material removed includes Subcutaneous Tissue and Slough and after achieving pain control using Lidocaine 4% Topical Solution. No specimens were taken. A time out was conducted at 09:53, prior to the start of the procedure. A Minimum amount of bleeding was controlled with Pressure. The procedure was tolerated well with a pain level of 0 throughout and a pain level of 0 following the procedure. Post Debridement Measurements: 6.5cm length x 3.4cm width x 3.6cm depth; 62.486cm^3 volume. Post debridement Stage noted as Category/Stage IV. Character of Wound/Ulcer Post Debridement is improved. Post procedure Diagnosis Wound #1: Same as Pre-Procedure Plan Wound Cleansing: Wound #1 Right Gluteal fold: Cleanse wound with mild soap and water Anesthetic (add to Medication List): Wound #1 Right Gluteal fold: Topical Lidocaine 4% cream applied to wound bed prior to debridement (In Clinic Only). Primary Wound Dressing: Wound #1 Right Gluteal fold: Silver Alginate - in clinic until SNF restarts NPWT COREN, SAGAN ([id number]) Secondary Dressing: Wound #1 Right Gluteal  fold: Boardered Foam Dressing Dressing Change Frequency: Wound #1 Right Gluteal fold: Change Dressing Monday, Wednesday, Friday - and as needed Follow-up Appointments: Wound #1 Right Gluteal fold: Return Appointment in 1 week. Off-Loading: Wound #1 Right Gluteal fold: Mattress - Please continue air mattress at SNF Turn and reposition every 2 hours Negative  Pressure Wound Therapy: Wound #1 Right Gluteal fold: Wound VAC settings at 125/130 mmHg continuous pressure. Use BLACK/GREEN foam to wound cavity. Use WHITE foam to fill any tunnel/s and/or undermining. Change VAC dressing 3 X WEEK. Change canister as indicated when full. Nurse may titrate settings and frequency of dressing changes as clinically indicated. - white foam to any tunnels Home Health Nurse may d/c VAC for s/s of increased infection, significant wound regression, or uncontrolled drainage. [place] at [phone]. Number of foam/gauze pieces used in the dressing = Medications-please add to medication list.: Wound #1 Right Gluteal fold: Santyl Enzymatic Ointment - Please discontinue santyl under NPWT Other: - IV antibiotics, difucan PO We are gonna continue with the above wound care measures for the next week. The patient is in agreement that plan. I will see were things stand at follow-up. Please see above for specific wound care orders. We will see patient for re-evaluation in 1 week(s) here in the clinic. If anything worsens or changes patient will contact our office for additional recommendations. Electronic Signature(s) Signed: 08/03/2018 5:17:02 PM By: Worthy Keeler [name] Entered By: Worthy Keeler on 08/03/2018 17:16:18 BINNIE, VONDERHAAR ([id number]) -------------------------------------------------------------------------------- ROS/PFSH Details Patient Name: [name] Date of Service: 08/03/2018 9:00 AM Medical Record Number: [id number] Patient Account Number: [id number] Date of Birth/Sex: July 27, 1950 (68 y.o. M) Treating RN: Montey Hora Primary Care Provider: [name] Other Clinician: Referring Provider: [name] Treating Provider/Extender: [name], Moishy Laday Weeks in Treatment: 35 Information Obtained From Patient Wound History Do you currently have one or more open woundso Yes How many open wounds do you currently  haveo 1 Approximately how long have you had your woundso 1 month How have you been treating your wound(s) until nowo cream Has your wound(s) ever healed and then re-openedo No Have you had any lab work done in the past montho No Have you tested positive for an antibiotic resistant organism (MRSA, VRE)o No Have you tested positive for osteomyelitis (bone infection)o No Have you had any tests for circulation on your legso No Constitutional Symptoms (General Health) Complaints and Symptoms: Negative for: Fever; Chills Eyes Medical History: Positive for: Cataracts - both removed Negative for: Glaucoma; Optic Neuritis Ear/Nose/Mouth/Throat Medical History: Negative for: Chronic sinus problems/congestion; Middle ear problems Hematologic/Lymphatic Medical History: Negative for: Anemia; Hemophilia; Human Immunodeficiency Virus; Lymphedema Respiratory Complaints and Symptoms: No Complaints or Symptoms Medical History: Negative for: Aspiration; Asthma; Chronic Obstructive Pulmonary Disease (COPD); Pneumothorax; Sleep Apnea; Tuberculosis Cardiovascular Complaints and Symptoms: No Complaints or Symptoms Medical History: BRESLIN, HEMANN ([id number]) Positive for: Hypertension - takes medication Negative for: Angina; Arrhythmia; Congestive Heart Failure; Coronary Artery Disease; Deep Vein Thrombosis; Hypotension; Myocardial Infarction; Peripheral Arterial Disease; Peripheral Venous Disease; Phlebitis; Vasculitis Gastrointestinal Medical History: Negative for: Cirrhosis ; Colitis; Crohnos; Hepatitis A; Hepatitis B; Hepatitis C Endocrine Medical History: Negative for: Type I Diabetes; Type II Diabetes Genitourinary Medical History: Negative for: End Stage Renal Disease Immunological Medical History: Negative for: Lupus Erythematosus; Raynaudos; Scleroderma Integumentary (Skin) Medical History: Negative for: History of Burn; History of pressure wounds Musculoskeletal Medical  History: Negative for: Gout; Rheumatoid Arthritis; Osteoarthritis; Osteomyelitis Neurologic Medical History: Positive for: Paraplegia -  waist down Negative for: Dementia; Neuropathy; Quadriplegia; Seizure Disorder Oncologic Medical History: Negative for: Received Chemotherapy; Received Radiation Past Medical History Notes: Prostate cancer- currently treated with horomone therapy Psychiatric Complaints and Symptoms: No Complaints or Symptoms Medical History: Negative for: Anorexia/bulimia; Confinement Anxiety HBO Extended History Items Eyes: Cataracts NASIER, THUMM. ([id number]) Immunizations Pneumococcal Vaccine: Received Pneumococcal Vaccination: No Implantable Devices Family and Social History Cancer: No; Diabetes: No; Heart Disease: No; Hypertension: Yes - Father; Kidney Disease: No; Lung Disease: No; Seizures: No; Stroke: Yes - Mother; Thyroid Problems: No; Tuberculosis: No; Never smoker; Marital Status - Married; Alcohol Use: Never; Drug Use: No History; Caffeine Use: Daily; Advanced Directives: Yes (Copy provided); Patient does not want information on Advanced Directives; Living Will: Yes (Copy provided) Physician Affirmation I have reviewed and agree with the above information. Electronic Signature(s) Signed: 08/03/2018 5:17:02 PM By: Worthy Keeler [name] Signed: 08/03/2018 5:22:36 PM By: Montey Hora Entered By: Worthy Keeler on 08/03/2018 17:15:45 RON, BESKE ([id number]) -------------------------------------------------------------------------------- SuperBill Details Patient Name: [name] Date of Service: 08/03/2018 Medical Record Number: [id number] Patient Account Number: [id number] Date of Birth/Sex: [birth year] (68 y.o. 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, sequela I10 Essential (primary) hypertension Facility Procedures CPT4 Code: 79038333 Description: 83291 - DEB SUBQ TISSUE 20 SQ CM/< ICD-10 Diagnosis Description L89.314 Pressure ulcer of right buttock, stage 4 Modifier: Quantity: 1 CPT4 Code: 91660600 Description: 45997 - DEB SUBQ TISS EA ADDL 20CM ICD-10 Diagnosis Description L89.314 Pressure ulcer of right buttock, stage 4 Modifier: Quantity: 1 Physician Procedures CPT4 Code: 7414239 Description: 11042 - WC PHYS SUBQ TISS 20 SQ CM ICD-10 Diagnosis Description L89.314 Pressure ulcer of right buttock, stage 4 Modifier: Quantity: 1 CPT4 Code: 5320233 Description: 43568 - WC PHYS SUBQ TISS EA ADDL 20 CM ICD-10 Diagnosis Description L89.314 Pressure ulcer of right buttock, stage 4 Modifier: Quantity: 1 Electronic Signature(s) Signed: 08/03/2018 5:17:02 PM By: Worthy Keeler [name] Entered By: Worthy Keeler on 08/03/2018 17:16:30

## 2018-08-06 NOTE — Progress Notes (Signed)
Brett, [name] ([id number]) Visit Report for 08/03/2018 Arrival Information Details Patient Name: [name], [name] Date of Service: 08/03/2018 9:00 AM Medical Record Number: [id number] Patient Account Number: [id number] Date of Birth/Sex: 1951-07-10 (68 y.o. M) Treating RN: Montey Hora Primary Care Suezette Lafave: [name] Other Clinician: Referring Trilby Way: [name] Treating Breken Nazari/Extender: [name], HOYT Weeks in Treatment: 58 Visit Information History Since Last Visit Added or deleted any medications: No Patient Arrived: Wheel Chair Any new allergies or adverse reactions: No Arrival Time: 09:04 Had a fall or experienced change in No Accompanied By: wife activities of daily living that may affect Transfer Assistance: None risk of falls: Patient Identification Verified: Yes Signs or symptoms of abuse/neglect since last visito No Secondary Verification Process Completed: Yes Hospitalized since last visit: No Patient Requires Transmission-Based No Implantable device outside of the clinic excluding No Precautions: cellular tissue based products placed in the center Patient Has Alerts: Yes since last visit: Patient Alerts: NOT Has Dressing in Place as Prescribed: Yes Diabetic Pain Present Now: Yes Electronic Signature(s) Signed: 08/03/2018 1:47:13 PM By: Lorine Bears RCP, RRT, CHT Entered By: Lorine Bears on 08/03/2018 09:05:38 [name], [name] ([id number]) -------------------------------------------------------------------------------- Encounter Discharge Information Details Patient Name: [name] Date of Service: 08/03/2018 9:00 AM Medical Record Number: [id number] Patient Account Number: [id number] Date of Birth/Sex: [birth year] (68 y.o. M) Treating RN: Montey Hora Primary Care Evelisse Szalkowski: [name] Other Clinician: Referring Danyael Alipio: [name] Treating Ewing Fandino/Extender: [name],  HOYT Weeks in Treatment: 84 Encounter Discharge Information Items Post Procedure Vitals Discharge Condition: Stable Temperature (F): 98.3 Ambulatory Status: Wheelchair Pulse (bpm): 80 Discharge Destination: [place] Respiratory Rate (breaths/min): 16 Telephoned: No Blood Pressure (mmHg): 142/62 Orders Sent: Yes Transportation: Private Auto Accompanied By: wife Schedule Follow-up Appointment: Yes Clinical Summary of Care: Electronic Signature(s) Signed: 08/03/2018 5:22:36 PM By: Montey Hora Entered By: Montey Hora on 08/03/2018 10:00:38 [name] ([id number]) -------------------------------------------------------------------------------- Lower Extremity Assessment Details Patient Name: [name] Date of Service: 08/03/2018 9:00 AM Medical Record Number: [id number] Patient Account Number: [id number] Date of Birth/Sex: [birth year] (68 y.o. M) Treating RN: Harold Barban Primary Care Shoni Quijas: [name] Other Clinician: Referring Kylinn Shropshire: [name] Treating Gigi Onstad/Extender: [name], HOYT Weeks in Treatment: 35 Electronic Signature(s) Signed: 08/05/2018 4:21:01 PM By: Harold Barban Entered By: Harold Barban on 08/03/2018 09:23:50 [name], [name] ([id number]) -------------------------------------------------------------------------------- Multi Wound Chart Details Patient Name: [name] Date of Service: 08/03/2018 9:00 AM Medical Record Number: [id number] Patient Account Number: [id number] Date of Birth/Sex: Oct 20, 1950 (68 y.o. M) Treating RN: Montey Hora Primary Care Naif Alabi: [name] Other Clinician: Referring Pristine Gladhill: [name] Treating Shavon Ashmore/Extender: [name], HOYT Weeks in Treatment: 35 Vital Signs Height(in): 73 Pulse(bpm): 80 Weight(lbs): 210 Blood Pressure(mmHg): 142/62 Body Mass Index(BMI): 28 Temperature(F): 98.3 Respiratory  Rate 18 (breaths/min): Photos: [1:No Photos] [N/A:N/A] Wound Location: [1:Right Gluteal fold] [N/A:N/A] Wounding Event: [1:Pressure Injury] [N/A:N/A] Primary Etiology: [1:Pressure Ulcer] [N/A:N/A] Comorbid History: [1:Cataracts, Hypertension, Paraplegia] [N/A:N/A] Date Acquired: [1:11/02/2017] [N/A:N/A] Weeks of Treatment: [1:35] [N/A:N/A] Wound Status: [1:Open] [N/A:N/A] Measurements L x W x D [1:6.5x3.4x3.5] [N/A:N/A] (cm) Area (cm) : [1:17.357] [N/A:N/A] Volume (cm) : [1:60.751] [N/A:N/A] % Reduction in Area: [1:-72.70%] [N/A:N/A] % Reduction in Volume: [1:-5944.90%] [N/A:N/A] Position 1 (o'clock): [1:2] Maximum Distance 1 (cm): [1:2.5] Tunneling: [1:Yes] [N/A:N/A] Classification: [1:Category/Stage IV] [N/A:N/A] Exudate Amount: [1:Medium] [N/A:N/A] Exudate Type: [1:Serous] [N/A:N/A] Exudate Color: [1:amber] [N/A:N/A] Wound Margin: [1:Flat and Intact] [N/A:N/A] Granulation Amount: [1:Medium (34-66%)] [N/A:N/A] Granulation Quality: [1:Red, Pink] [N/A:N/A] Necrotic Amount: [1:Medium (34-66%)] [N/A:N/A]  Exposed Structures: [1:Fat Layer (Subcutaneous Tissue) Exposed: Yes Fascia: No Tendon: No Muscle: No Joint: No Bone: No] [N/A:N/A] Epithelialization: [1:None] [N/A:N/A] Periwound Skin Texture: [1:Excoriation: Yes Rash: Yes Induration: No] [N/A:N/A] Callus: No Crepitus: No Scarring: No Periwound Skin Moisture: Maceration: No N/A N/A Dry/Scaly: No Periwound Skin Color: Erythema: Yes N/A N/A Atrophie Blanche: No Cyanosis: No Ecchymosis: No Hemosiderin Staining: No Mottled: No Pallor: No Rubor: No Erythema Location: Circumferential N/A N/A Temperature: No Abnormality N/A N/A Tenderness on Palpation: No N/A N/A Wound Preparation: Ulcer Cleansing: N/A N/A Rinsed/Irrigated with Saline Topical Anesthetic Applied: Other: lidocaine 4% Treatment Notes Electronic Signature(s) Signed: 08/03/2018 5:22:36 PM By: Montey Hora Entered By: Montey Hora on 08/03/2018  09:53:10 [name], [name] ([id number]) -------------------------------------------------------------------------------- Multi-Disciplinary Care Plan Details Patient Name: [name] Date of Service: 08/03/2018 9:00 AM Medical Record Number: [id number] Patient Account Number: [id number] Date of Birth/Sex: 1951-07-13 (68 y.o. M) Treating RN: Montey Hora Primary Care Shanira Tine: [name] Other Clinician: Referring Trent Theisen: [name] Treating Joie Reamer/Extender: [name], HOYT Weeks in Treatment: 22 Active Inactive Orientation to the Wound Care Program Nursing Diagnoses: Knowledge deficit related to the wound healing center program Goals: Patient/caregiver will verbalize understanding of the [facility] Date Initiated: 11/30/2017 Target Resolution Date: 12/21/2017 Goal Status: Active Interventions: Provide education on orientation to the wound center Notes: Pressure Nursing Diagnoses: Knowledge deficit related to management of pressures ulcers Goals: Patient/caregiver will verbalize understanding of pressure ulcer management Date Initiated: 05/17/2018 Target Resolution Date: 05/28/2018 Goal Status: Active Interventions: Assess: immobility, friction, shearing, incontinence upon admission and as needed Notes: Wound/Skin Impairment Nursing Diagnoses: Impaired tissue integrity Goals: Patient/caregiver will verbalize understanding of skin care regimen Date Initiated: 11/30/2017 Target Resolution Date: 12/21/2017 Goal Status: Active Ulcer/skin breakdown will have a volume reduction of 30% by week 4 Date Initiated: 11/30/2017 Target Resolution Date: 12/21/2017 Goal Status: Active [name], [name] ([id number]) Interventions: Assess patient/caregiver ability to obtain necessary supplies Assess patient/caregiver ability to perform ulcer/skin care regimen upon admission and as needed Assess ulceration(s) every visit Treatment  Activities: Skin care regimen initiated : 11/30/2017 Notes: Electronic Signature(s) Signed: 08/03/2018 5:22:36 PM By: Montey Hora Entered By: Montey Hora on 08/03/2018 09:53:03 [name], [name] ([id number]) -------------------------------------------------------------------------------- Pain Assessment Details Patient Name: [name] Date of Service: 08/03/2018 9:00 AM Medical Record Number: [id number] Patient Account Number: [id number] Date of Birth/Sex: Jan 19, 1951 (67 y.o. M) Treating RN: Montey Hora Primary Care Brynda Heick: [name] Other Clinician: Referring Che Below: [name] Treating Tanairi Cypert/Extender: [name], HOYT Weeks in Treatment: 35 Active Problems Location of Pain Severity and Description of Pain Patient Has Paino Yes Site Locations Rate the pain. Current Pain Level: 8 Pain Management and Medication Current Pain Management: Electronic Signature(s) Signed: 08/03/2018 1:47:13 PM By: Lorine Bears RCP, RRT, CHT Signed: 08/03/2018 5:22:36 PM By: Montey Hora Entered By: Lorine Bears on 08/03/2018 09:05:51 [name], [name] ([id number]) -------------------------------------------------------------------------------- Patient/Caregiver Education Details Patient Name: [name] Date of Service: 08/03/2018 9:00 AM Medical Record Number: [id number] Patient Account Number: [id number] Date of Birth/Gender: [birth year] (67 y.o. M) Treating RN: Montey Hora Primary Care Physician: [name] Other Clinician: Referring Physician: [name] Treating Physician/Extender: Sharalyn Ink in Treatment: 47 Education Assessment Education Provided To: Caregiver SNF nurses via written orders Education Topics Provided Wound/Skin Impairment: Handouts: Other: wound care orders Methods: Printed Electronic Signature(s) Signed: 08/03/2018 5:22:36 PM By: Montey Hora Entered By: Montey Hora on 08/03/2018 10:13:02 [name] ([id number]) -------------------------------------------------------------------------------- Wound Assessment Details Patient Name: Brett Reichert  J. Date of Service: 08/03/2018 9:00 AM Medical Record Number: [id number] Patient Account Number: [id number] Date of Birth/Sex: 08-15-1950 (67 y.o. M) Treating RN: Harold Barban Primary Care Valen Mascaro: [name] Other Clinician: Referring Martavis Gurney: [name] Treating Dominica Kent/Extender: [name], HOYT Weeks in Treatment: 35 Wound Status Wound Number: 1 Primary Etiology: Pressure Ulcer Wound Location: Right Gluteal fold Wound Status: Open Wounding Event: Pressure Injury Comorbid History: Cataracts, Hypertension, Paraplegia Date Acquired: 11/02/2017 Weeks Of Treatment: 35 Clustered Wound: No Photos Wound Measurements Length: (cm) 6.5 % Reduction in Width: (cm) 3.4 % Reduction in Depth: (cm) 3.5 Epithelializat Area: (cm) 17.357 Tunneling: Volume: (cm) 60.751 Position ( Maximum Dis Area: -72.7% Volume: -5944.9% ion: None Yes o'clock): 2 tance: (cm) 2.5 Wound Description Classification: Category/Stage IV Foul Odor Afte Wound Margin: Flat and Intact Slough/Fibrino Exudate Amount: Medium Exudate Type: Serous Exudate Color: amber r Cleansing: No Yes Wound Bed Granulation Amount: Medium (34-66%) Exposed Structure Granulation Quality: Red, Pink Fascia Exposed: No Necrotic Amount: Medium (34-66%) Fat Layer (Subcutaneous Tissue) Exposed: Yes Necrotic Quality: Adherent Slough Tendon Exposed: No Muscle Exposed: No Joint Exposed: No Bone Exposed: No [name], [name] ([id number]) Periwound Skin Texture Texture Color No Abnormalities Noted: No No Abnormalities Noted: No Callus: No Atrophie Blanche: No Crepitus: No Cyanosis: No Excoriation: Yes Ecchymosis: No Induration: No Erythema: Yes Rash: Yes Erythema Location: Circumferential Scarring:  No Hemosiderin Staining: No Mottled: No Moisture Pallor: No No Abnormalities Noted: No Rubor: No Dry / Scaly: No Maceration: No Temperature / Pain Temperature: No Abnormality Wound Preparation Ulcer Cleansing: Rinsed/Irrigated with Saline Topical Anesthetic Applied: Other: lidocaine 4%, Treatment Notes Wound #1 (Right Gluteal fold) Notes silvercel, bordered foam dressing; SNF nurses to reapply NPWT Electronic Signature(s) Signed: 08/05/2018 4:21:01 PM By: Harold Barban Entered By: Harold Barban on 08/03/2018 10:04:06 [name] ([id number]) -------------------------------------------------------------------------------- Vitals Details Patient Name: [name] Date of Service: 08/03/2018 9:00 AM Medical Record Number: [id number] Patient Account Number: [id number] Date of Birth/Sex: [birth year] (67 y.o. M) Treating RN: Montey Hora Primary Care Jeanise Durfey: [name] Other Clinician: Referring Philomene Haff: [name] Treating Katalena Malveaux/Extender: [name], HOYT Weeks in Treatment: 35 Vital Signs Time Taken: 09:05 Temperature (F): 98.3 Height (in): 73 Pulse (bpm): 80 Weight (lbs): 210 Respiratory Rate (breaths/min): 18 Body Mass Index (BMI): 27.7 Blood Pressure (mmHg): 142/62 Reference Range: 80 - 120 mg / dl Electronic Signature(s) Signed: 08/03/2018 1:47:13 PM By: Lorine Bears RCP, RRT, CHT Entered By: Becky Sax, Amado Nash on 08/03/2018 09:08:58

## 2018-08-09 ENCOUNTER — Encounter: Payer: Medicare Other | Admitting: Physician Assistant

## 2018-08-09 DIAGNOSIS — L89314 Pressure ulcer of right buttock, stage 4: Secondary | ICD-10-CM | POA: Diagnosis not present

## 2018-08-10 ENCOUNTER — Encounter: Payer: Self-pay | Admitting: Infectious Diseases

## 2018-08-10 ENCOUNTER — Ambulatory Visit: Payer: Medicare Other | Attending: Infectious Diseases | Admitting: Infectious Diseases

## 2018-08-10 VITALS — BP 134/85 | HR 99 | Temp 98.3°F | Ht 73.0 in | Wt 200.0 lb

## 2018-08-10 DIAGNOSIS — Z87891 Personal history of nicotine dependence: Secondary | ICD-10-CM

## 2018-08-10 DIAGNOSIS — L89314 Pressure ulcer of right buttock, stage 4: Secondary | ICD-10-CM

## 2018-08-10 DIAGNOSIS — G8220 Paraplegia, unspecified: Secondary | ICD-10-CM

## 2018-08-10 DIAGNOSIS — M4626 Osteomyelitis of vertebra, lumbar region: Secondary | ICD-10-CM | POA: Diagnosis not present

## 2018-08-10 DIAGNOSIS — B9562 Methicillin resistant Staphylococcus aureus infection as the cause of diseases classified elsewhere: Secondary | ICD-10-CM | POA: Diagnosis not present

## 2018-08-10 DIAGNOSIS — Z880 Allergy status to penicillin: Secondary | ICD-10-CM

## 2018-08-10 DIAGNOSIS — L89319 Pressure ulcer of right buttock, unspecified stage: Secondary | ICD-10-CM

## 2018-08-10 DIAGNOSIS — Q2739 Arteriovenous malformation, other site: Secondary | ICD-10-CM

## 2018-08-10 DIAGNOSIS — Z881 Allergy status to other antibiotic agents status: Secondary | ICD-10-CM

## 2018-08-10 NOTE — Progress Notes (Signed)
STARK, AGUINAGA ([id number]) Visit Report for 08/09/2018 Arrival Information Details Patient Name: [name], [name] Date of Service: 08/09/2018 8:30 AM Medical Record Number: [id number] Patient Account Number: [id number] Date of Birth/Sex: 05-Jun-1951 (67 y.o. M) Treating RN: Harold Barban Primary Care Tressia Labrum: [name] Other Clinician: Referring Eligio Angert: [name] Treating Heather Streeper/Extender: [name], HOYT Weeks in Treatment: 76 Visit Information History Since Last Visit Added or deleted any medications: No Patient Arrived: Wheel Chair Any new allergies or adverse reactions: No Arrival Time: 08:25 Had a fall or experienced change in No Accompanied By: wife activities of daily living that may affect Transfer Assistance: Manual risk of falls: Patient Identification Verified: Yes Signs or symptoms of abuse/neglect since last visito No Secondary Verification Process Completed: Yes Hospitalized since last visit: No Patient Requires Transmission-Based No Implantable device outside of the clinic excluding No Precautions: cellular tissue based products placed in the center Patient Has Alerts: Yes since last visit: Patient Alerts: NOT Has Dressing in Place as Prescribed: Yes Diabetic Pain Present Now: Yes Electronic Signature(s) Signed: 08/09/2018 3:56:35 PM By: Lorine Bears RCP, RRT, CHT Entered By: Lorine Bears on 08/09/2018 08:27:20 OMARIO, ANDER ([id number]) -------------------------------------------------------------------------------- Clinic Level of Care Assessment Details Patient Name: [name] Date of Service: 08/09/2018 8:30 AM Medical Record Number: [id number] Patient Account Number: [id number] Date of Birth/Sex: 08/19/1950 (67 y.o. M) Treating RN: Harold Barban Primary Care Zykeem Bauserman: [name] Other Clinician: Referring Jatorian Renault: [name] Treating Zacharias Ridling/Extender: [name],  HOYT Weeks in Treatment: 36 Clinic Level of Care Assessment Items TOOL 4 Quantity Score []  - Use when only an EandM is performed on FOLLOW-UP visit 0 ASSESSMENTS - Nursing Assessment / Reassessment X - Reassessment of Co-morbidities (includes updates in patient status) 1 10 X- 1 5 Reassessment of Adherence to Treatment Plan ASSESSMENTS - Wound and Skin Assessment / Reassessment X - Simple Wound Assessment / Reassessment - one wound 1 5 []  - 0 Complex Wound Assessment / Reassessment - multiple wounds []  - 0 Dermatologic / Skin Assessment (not related to wound area) ASSESSMENTS - Focused Assessment []  - Circumferential Edema Measurements - multi extremities 0 []  - 0 Nutritional Assessment / Counseling / Intervention []  - 0 Lower Extremity Assessment (monofilament, tuning fork, pulses) []  - 0 Peripheral Arterial Disease Assessment (using hand held doppler) ASSESSMENTS - Ostomy and/or Continence Assessment and Care []  - Incontinence Assessment and Management 0 []  - 0 Ostomy Care Assessment and Management (repouching, etc.) PROCESS - Coordination of Care X - Simple Patient / Family Education for ongoing care 1 15 []  - 0 Complex (extensive) Patient / Family Education for ongoing care X- 1 10 Staff obtains Programmer, systems, Records, Test Results / Process Orders X- 1 10 Staff telephones HHA, Nursing Homes / Clarify orders / etc []  - 0 Routine Transfer to another Facility (non-emergent condition) []  - 0 Routine Hospital Admission (non-emergent condition) []  - 0 New Admissions / Biomedical engineer / Ordering NPWT, Apligraf, etc. []  - 0 Emergency Hospital Admission (emergent condition) X- 1 10 Simple Discharge Coordination GODRIC, LAVELL. ([id number]) []  - 0 Complex (extensive) Discharge Coordination PROCESS - Special Needs []  - Pediatric / Minor Patient Management 0 []  - 0 Isolation Patient Management []  - 0 Hearing / Language / Visual special needs []  - 0 Assessment  of Community assistance (transportation, D/C planning, etc.) []  - 0 Additional assistance / Altered mentation []  - 0 Support Surface(s) Assessment (bed, cushion, seat, etc.) INTERVENTIONS - Wound Cleansing / Measurement X - Simple Wound Cleansing -  one wound 1 5 []  - 0 Complex Wound Cleansing - multiple wounds X- 1 5 Wound Imaging (photographs - any number of wounds) []  - 0 Wound Tracing (instead of photographs) X- 1 5 Simple Wound Measurement - one wound []  - 0 Complex Wound Measurement - multiple wounds INTERVENTIONS - Wound Dressings X - Small Wound Dressing one or multiple wounds 1 10 []  - 0 Medium Wound Dressing one or multiple wounds []  - 0 Large Wound Dressing one or multiple wounds []  - 0 Application of Medications - topical []  - 0 Application of Medications - injection INTERVENTIONS - Miscellaneous []  - External ear exam 0 []  - 0 Specimen Collection (cultures, biopsies, blood, body fluids, etc.) []  - 0 Specimen(s) / Culture(s) sent or taken to Lab for analysis []  - 0 Patient Transfer (multiple staff / Civil Service fast streamer / Similar devices) []  - 0 Simple Staple / Suture removal (25 or less) []  - 0 Complex Staple / Suture removal (26 or more) []  - 0 Hypo / Hyperglycemic Management (close monitor of Blood Glucose) []  - 0 Ankle / Brachial Index (ABI) - do not check if billed separately X- 1 5 Vital Signs Lienhard, Talal J. ([id number]) Has the patient been seen at the hospital within the last three years: Yes Total Score: 95 Level Of Care: New/Established - Level 3 Electronic Signature(s) Signed: 08/09/2018 5:13:49 PM By: Harold Barban Entered By: Harold Barban on 08/09/2018 08:57:32 Michaelsen, [name] ([id number]) -------------------------------------------------------------------------------- Encounter Discharge Information Details Patient Name: [name] Date of Service: 08/09/2018 8:30 AM Medical Record Number: [id number] Patient Account Number:  [id number] Date of Birth/Sex: 1950-11-26 (67 y.o. M) Treating RN: Harold Barban Primary Care Deaunna Olarte: [name] Other Clinician: Referring Daejon Lich: [name] Treating Montae Stager/Extender: [name], HOYT Weeks in Treatment: 40 Encounter Discharge Information Items Discharge Condition: Stable Ambulatory Status: Wheelchair Discharge Destination: Home Transportation: Private Auto Accompanied By: wife Schedule Follow-up Appointment: Yes Clinical Summary of Care: Electronic Signature(s) Signed: 08/09/2018 5:13:49 PM By: Harold Barban Entered By: Harold Barban on 08/09/2018 09:04:26 [name] ([id number]) -------------------------------------------------------------------------------- Lower Extremity Assessment Details Patient Name: [name] Date of Service: 08/09/2018 8:30 AM Medical Record Number: [id number] Patient Account Number: [id number] Date of Birth/Sex: Jan 24, 1951 (67 y.o. M) Treating RN: [name] Primary Care Jordayn Mink: [name] Other Clinician: Referring Eulalia Ellerman: [name] Treating Everet Flagg/Extender: Sharalyn Ink in Treatment: 45 Electronic Signature(s) Signed: 08/09/2018 5:04:52 PM By: Gretta Cool, BSN, RN, CWS, Kim RN, BSN Entered By: Gretta Cool, BSN, RN, CWS, Kim on 08/09/2018 08:38:04 CHENEY, EWART ([id number]) -------------------------------------------------------------------------------- Multi Wound Chart Details Patient Name: [name] Date of Service: 08/09/2018 8:30 AM Medical Record Number: [id number] Patient Account Number: [id number] Date of Birth/Sex: 1950/11/13 (67 y.o. M) Treating RN: Harold Barban Primary Care Shahed Yeoman: [name] Other Clinician: Referring Forney Kleinpeter: [name] Treating Yukio Bisping/Extender: [name], HOYT Weeks in Treatment: 36 Vital Signs Height(in): 73 Pulse(bpm): 88 Weight(lbs): 210 Blood Pressure(mmHg): 148/69 Body Mass Index(BMI):  28 Temperature(F): 98.2 Respiratory Rate 18 (breaths/min): Photos: [1:No Photos] [N/A:N/A] Wound Location: [1:Right Gluteal fold] [N/A:N/A] Wounding Event: [1:Pressure Injury] [N/A:N/A] Primary Etiology: [1:Pressure Ulcer] [N/A:N/A] Comorbid History: [1:Cataracts, Hypertension, Paraplegia] [N/A:N/A] Date Acquired: [1:11/02/2017] [N/A:N/A] Weeks of Treatment: [1:36] [N/A:N/A] Wound Status: [1:Open] [N/A:N/A] Measurements L x W x D [1:6x1.2x4] [N/A:N/A] (cm) Area (cm) : [1:5.655] [N/A:N/A] Volume (cm) : [1:22.619] [N/A:N/A] % Reduction in Area: [1:43.70%] [N/A:N/A] % Reduction in Volume: [1:-2150.60%] [N/A:N/A] Position 1 (o'clock): [1:2] Maximum Distance 1 (cm): [1:5.2] Position 2 (o'clock): [1:6] Maximum Distance 2 (cm): [1:4.5]  Tunneling: [1:Yes] [N/A:N/A] Classification: [1:Category/Stage IV] [N/A:N/A] Exudate Amount: [1:Medium] [N/A:N/A] Exudate Type: [1:Sanguinous] [N/A:N/A] Exudate Color: [1:red] [N/A:N/A] Wound Margin: [1:Epibole] [N/A:N/A] Granulation Amount: [1:Large (67-100%)] [N/A:N/A] Granulation Quality: [1:Red, Pink, Hyper-granulation] [N/A:N/A] Necrotic Amount: [1:Small (1-33%)] [N/A:N/A] Exposed Structures: [1:Fat Layer (Subcutaneous Tissue) Exposed: Yes Fascia: No Tendon: No Muscle: No Joint: No Bone: No] [N/A:N/A] Epithelialization: [1:Small (1-33%)] [N/A:N/A] Periwound Skin Texture: [N/A:N/A] Rash: Yes Excoriation: No Induration: No Callus: No Crepitus: No Scarring: No Periwound Skin Moisture: Maceration: No N/A N/A Dry/Scaly: No Periwound Skin Color: Atrophie Blanche: No N/A N/A Cyanosis: No Ecchymosis: No Erythema: No Hemosiderin Staining: No Mottled: No Pallor: No Rubor: No Temperature: No Abnormality N/A N/A Tenderness on Palpation: No N/A N/A Wound Preparation: Ulcer Cleansing: N/A N/A Rinsed/Irrigated with Saline Topical Anesthetic Applied: Other: lidocaine 4% Treatment Notes Electronic Signature(s) Signed: 08/09/2018 5:13:49  PM By: Harold Barban Entered By: Harold Barban on 08/09/2018 08:54:55 SAEL, FURCHES ([id number]) -------------------------------------------------------------------------------- [place] Details Patient Name: [name] Date of Service: 08/09/2018 8:30 AM Medical Record Number: [id number] Patient Account Number: [id number] Date of Birth/Sex: 06-08-1951 (67 y.o. M) Treating RN: Harold Barban Primary Care Janeisha Ryle: [name] Other Clinician: Referring Zniya Cottone: [name] Treating Lilliahna Schubring/Extender: [name], HOYT Weeks in Treatment: 49 Active Inactive Orientation to the Wound Care Program Nursing Diagnoses: Knowledge deficit related to the wound healing center program Goals: Patient/caregiver will verbalize understanding of the [facility] Date Initiated: 11/30/2017 Target Resolution Date: 12/21/2017 Goal Status: Active Interventions: Provide education on orientation to the wound center Notes: Pressure Nursing Diagnoses: Knowledge deficit related to management of pressures ulcers Goals: Patient/caregiver will verbalize understanding of pressure ulcer management Date Initiated: 05/17/2018 Target Resolution Date: 05/28/2018 Goal Status: Active Interventions: Assess: immobility, friction, shearing, incontinence upon admission and as needed Notes: Wound/Skin Impairment Nursing Diagnoses: Impaired tissue integrity Goals: Patient/caregiver will verbalize understanding of skin care regimen Date Initiated: 11/30/2017 Target Resolution Date: 12/21/2017 Goal Status: Active Ulcer/skin breakdown will have a volume reduction of 30% by week 4 Date Initiated: 11/30/2017 Target Resolution Date: 12/21/2017 Goal Status: Active NAYAN, PROCH ([id number]) Interventions: Assess patient/caregiver ability to obtain necessary supplies Assess patient/caregiver ability to perform ulcer/skin care regimen upon admission and as  needed Assess ulceration(s) every visit Treatment Activities: Skin care regimen initiated : 11/30/2017 Notes: Electronic Signature(s) Signed: 08/09/2018 5:13:49 PM By: Harold Barban Entered By: Harold Barban on 08/09/2018 08:54:48 [name] ([id number]) -------------------------------------------------------------------------------- Pain Assessment Details Patient Name: [name] Date of Service: 08/09/2018 8:30 AM Medical Record Number: [id number] Patient Account Number: [id number] Date of Birth/Sex: Mar 21, 1951 (67 y.o. M) Treating RN: Harold Barban Primary Care Katey Barrie: [name] Other Clinician: Referring Dylan Ruotolo: [name] Treating Mikeala Girdler/Extender: [name], HOYT Weeks in Treatment: 7 Active Problems Location of Pain Severity and Description of Pain Patient Has Paino Yes Site Locations Rate the pain. Current Pain Level: 7 Pain Management and Medication Current Pain Management: Electronic Signature(s) Signed: 08/09/2018 3:56:35 PM By: Lorine Bears RCP, RRT, CHT Signed: 08/09/2018 5:13:49 PM By: Harold Barban Entered By: Lorine Bears on 08/09/2018 08:27:31 KURTIS, ANASTASIA ([id number]) -------------------------------------------------------------------------------- Patient/Caregiver Education Details Patient Name: [name] Date of Service: 08/09/2018 8:30 AM Medical Record Number: [id number] Patient Account Number: [id number] Date of Birth/Gender: [birth year] (67 y.o. M) Treating RN: Harold Barban Primary Care Physician: [name] Other Clinician: Referring Physician: [name] Treating Physician/Extender: Sharalyn Ink in Treatment: 22 Education Assessment Education Provided To: Patient Education Topics Provided Wound/Skin Impairment: Handouts: Caring for Your Ulcer Methods:  Demonstration, Explain/Verbal Responses: State content correctly Electronic  Signature(s) Signed: 08/09/2018 5:13:49 PM By: Harold Barban Entered By: Harold Barban on 08/09/2018 09:05:07 ARMANDO, BUKHARI ([id number]) -------------------------------------------------------------------------------- Wound Assessment Details Patient Name: [name] Date of Service: 08/09/2018 8:30 AM Medical Record Number: [id number] Patient Account Number: [id number] Date of Birth/Sex: [birth year] (67 y.o. M) Treating RN: [name] Primary Care Maurya Nethery: [name] Other Clinician: Referring Andre Gallego: [name] Treating Gladiola Madore/Extender: [name], HOYT Weeks in Treatment: 36 Wound Status Wound Number: 1 Primary Etiology: Pressure Ulcer Wound Location: Right Gluteal fold Wound Status: Open Wounding Event: Pressure Injury Comorbid History: Cataracts, Hypertension, Paraplegia Date Acquired: 11/02/2017 Weeks Of Treatment: 36 Clustered Wound: No Photos Photo Uploaded By: Gretta Cool, BSN, RN, CWS, Kim on 08/09/2018 13:48:37 Wound Measurements Length: (cm) 6 Width: (cm) 1.2 Depth: (cm) 4 Area: (cm) 5.655 Volume: (cm) 22.619 % Reduction in Area: 43.7% % Reduction in Volume: -2150.6% Epithelialization: Small (1-33%) Tunneling: Yes Location 1 Position (o'clock): 2 Maximum Distance: (cm) 5.2 Location 2 Position (o'clock): 6 Maximum Distance: (cm) 4.5 Undermining: No Wound Description Classification: Category/Stage IV Wound Margin: Epibole Exudate Amount: Medium Exudate Type: Sanguinous Exudate Color: red Foul Odor After Cleansing: No Slough/Fibrino Yes Wound Bed Granulation Amount: Large (67-100%) Exposed Structure Granulation Quality: Red, Pink, Hyper-granulation Fascia Exposed: No Hartwell, Nickie J. ([id number]) Necrotic Amount: Small (1-33%) Fat Layer (Subcutaneous Tissue) Exposed: Yes Necrotic Quality: Adherent Slough Tendon Exposed: No Muscle Exposed: No Joint Exposed: No Bone Exposed: No Periwound Skin Texture Texture  Color No Abnormalities Noted: No No Abnormalities Noted: No Callus: No Atrophie Blanche: No Crepitus: No Cyanosis: No Excoriation: No Ecchymosis: No Induration: No Erythema: No Rash: Yes Hemosiderin Staining: No Scarring: No Mottled: No Pallor: No Moisture Rubor: No No Abnormalities Noted: No Dry / Scaly: No Temperature / Pain Maceration: No Temperature: No Abnormality Wound Preparation Ulcer Cleansing: Rinsed/Irrigated with Saline Topical Anesthetic Applied: Other: lidocaine 4%, Treatment Notes Wound #1 (Right Gluteal fold) Notes silvercel, bordered foam dressing; SNF nurses to reapply NPWT Electronic Signature(s) Signed: 08/09/2018 5:04:52 PM By: Gretta Cool, BSN, RN, CWS, Kim RN, BSN Entered By: Gretta Cool, BSN, RN, CWS, Kim on 08/09/2018 08:37:54 ANTIONIO, NEGRON ([id number]) -------------------------------------------------------------------------------- [place] Details Patient Name: [name] Date of Service: 08/09/2018 8:30 AM Medical Record Number: [id number] Patient Account Number: [id number] Date of Birth/Sex: [birth year] (67 y.o. M) Treating RN: Harold Barban Primary Care Alejo Beamer: [name] Other Clinician: Referring Chalice Philbert: [name] Treating Caelyn Route/Extender: [name], HOYT Weeks in Treatment: 36 Vital Signs Time Taken: 08:25 Temperature (F): 98.2 Height (in): 73 Pulse (bpm): 88 Weight (lbs): 210 Respiratory Rate (breaths/min): 18 Body Mass Index (BMI): 27.7 Blood Pressure (mmHg): 148/69 Reference Range: 80 - 120 mg / dl Electronic Signature(s) Signed: 08/09/2018 3:56:35 PM By: Lorine Bears RCP, RRT, CHT Entered By: Lorine Bears on 08/09/2018 08:28:20

## 2018-08-10 NOTE — Progress Notes (Signed)
NAME: [name]  DOB: February 11, 1951  MRN: [id number]  Date/Time: 08/10/2018 10:51 AM Subjective:  Follow-up visit after recent hospitalization ?here with his wife [name] is a 68 y.o. male Was in the hospital between 12/20- 07/20/2018 for right gluteal fold decubitus ulcer. 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 He was followed at the wound clinic and treated locally. He had wound vac for some time and as it was not staying in place it was removed. As it was getting worse he was sent to the hospital.He was started on vanco and cefepime and underwent CT on 07/09/18 which showed Progressive ulceration and open wound extending to the greater trochanter as described. 2. Irregularity at the greater trochanter is likely degenerative without definite osteomyelitis. 3. 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 He underwent debridement of the woun and bone biopsy by [name] on 07/15/18. Culture was MRSA and bone biopsy was chronic ostoe. He was sent to NH on vanco and ceftriaxone to complete 6 weeks. HE is doing well, saw [name] last week and the wound is much better. He is followed at the wound care clinic weekly. No fever or chills, 3 semis soft stools /day, no pain abdomen, no rash Past Medical History:  Diagnosis Date  . AVM (arteriovenous malformation) spine   . Cancer [facility])    prostate, taking Casodex (hormone chemotherapy)  . History of kidney stones    several times  . Hypertension   . 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 Prostate cancer ([place])   . Status post insertion of intrathecal baclofen pump   . Status post insertion of spinal cord stimulator     Past Surgical History:  Procedure Laterality Date  . COLONOSCOPY    . CYSTOSCOPY     several times  . CYSTOSCOPY WITH DIRECT VISION INTERNAL  URETHROTOMY  05/05/2017   Procedure: CYSTOSCOPY WITH DIRECT VISION INTERNAL URETHROTOMY;  Surgeon: Brett Cowper, MD;  Location: [facility];  Service: Urology;;  . Consuela Mimes WITH LITHOLAPAXY N/A 05/05/2017   Procedure: CYSTOSCOPY WITH LITHOLAPAXY;  Surgeon: Brett Cowper, MD;  Location: [facility];  Service: Urology;  Laterality: N/A;  . EXTRACORPOREAL SHOCK WAVE LITHOTRIPSY    . EYE SURGERY Bilateral    cataract surgery  . HOLMIUM LASER APPLICATION N/A 90/30/0923   Procedure: HOLMIUM LASER APPLICATION;  Surgeon: Brett Cowper, MD;  Location: [facility];  Service: Urology;  Laterality: N/A;  . [place]  . LUMBAR [place] SURGERY  2005   resulted in paralysis of lower extrems  . SKIN DEBRIDEMENT Right 07/15/2018   Procedure: DEBRIDEMENT SKIN FULL THICKNESS--stage 4 decubitus ulcer;  Surgeon: Brett Maudlin, MD;  Location: [facility];  Service: General;  Laterality: Right;    Social History   Socioeconomic History  . Marital status: Married    Spouse name: Not on file  . Number of children: Not on file  . Years of education: Not on file  . Highest education level: Not on file  Occupational History  . Not on file  Social Needs  . Financial resource strain: Not on file  . Food insecurity:    Worry: Not on file    Inability: Not on file  . Transportation needs:    Medical: Not on file  Non-medical: Not on file  Tobacco Use  . Smoking status: Never Smoker  . Smokeless tobacco: Former Systems developer    Types: Snuff  Substance and Sexual Activity  . Alcohol use: No  . Drug use: No  . Sexual activity: Not on file  Lifestyle  . Physical activity:    Days per week: Not on file    Minutes per session: Not on file  . Stress: Not on file  Relationships  . Social connections:    Talks on phone: Not on file    Gets together: Not on file    Attends religious service: Not on file    Active member of club or organization: Not on file    Attends meetings of clubs or organizations:  Not on file    Relationship status: Not on file  . Intimate partner violence:    Fear of current or ex partner: Not on file    Emotionally abused: Not on file    Physically abused: Not on file    Forced sexual activity: Not on file  Other Topics Concern  . Not on file  Social History Narrative  . Not on file    No family history on file. Allergies  Allergen Reactions  . Clindamycin Hives  . Doxycycline Rash  . Penicillins Rash  . Sulfamethoxazole Rash   ? Current Outpatient Medications  Medication Sig Dispense Refill  . acetaminophen (TYLENOL) 500 MG tablet Take 1,000 mg by mouth 2 (two) times daily.    . baclofen (LIORESAL) 10 MG tablet Take 10 mg by mouth daily as needed for muscle spasms.     . bicalutamide (CASODEX) 50 MG tablet Take 50 mg by mouth daily.    . cefTRIAXone (ROCEPHIN) IVPB Inject 2 g into the vein daily for 28 days. Indication:  Osteomyelitis of ischial bone Last Day of Therapy:  08/18/2018 Labs - Twice weekly on Mondays and Thursdays:  BMP, CBC, LFT, ESR Please pull PIC at completion of IV antibiotics 29 Units 0  . collagenase (SANTYL) ointment Apply topically daily. 15 g 0  . Ferrous Sulfate (IRON) 325 (65 Fe) MG TABS Take 325 mg by mouth daily.    Marland Kitchen gabapentin (NEURONTIN) 800 MG tablet Take 1 tablet (800 mg total) by mouth 3 (three) times daily. 90 tablet 0  . losartan (COZAAR) 100 MG tablet Take 100 mg by mouth daily.    . methenamine (HIPREX) 1 g tablet Take 1 g by mouth 3 (three) times daily.     . mirabegron ER (MYRBETRIQ) 50 MG TB24 tablet Take 50 mg by mouth daily.     . nutrition supplement, JUVEN, (JUVEN) PACK Take 1 packet by mouth 2 (two) times daily between meals. 30 packet 0  . oxybutynin (DITROPAN-XL) 10 MG 24 hr tablet Take 10 mg by mouth every morning.     Marland Kitchen oxyCODONE (OXY IR/ROXICODONE) 5 MG immediate release tablet Take 1-2 tablets (5-10 mg total) by mouth every 6 (six) hours as needed for breakthrough pain. 20 tablet 0  . PAIN MANAGEMENT  INTRATHECAL, IT, PUMP 1 each by Intrathecal route continuous. Intrathecal (IT) medication:  Baclofen, Dilaudid    . polyethylene glycol powder (GLYCOLAX/MIRALAX) powder Take 17 g by mouth daily.     Marland Kitchen Propylene Glycol (SYSTANE BALANCE) 0.6 % SOLN Place 1 drop into both eyes daily as needed (dry eyes).    . vancomycin IVPB Inject 1,250 mg into the vein every 12 (twelve) hours for 28 days. Indication:  Osteomyelitis of ischial  bone Last Day of Therapy:  08/18/2018 Labs - Twice weekly on Mondays and Thursdays:  BMP(baseline SCr 0.6), CBC, LFT, ESR  Labs - Once weekly on Mondays:  Vancomycin trough (goal 15-20 mcg/mL) Please pull PIC at completion of IV antibiotics 58 Units 0  . vitamin C (ASCORBIC ACID) 500 MG tablet Take 1,000 mg by mouth 3 (three) times daily.      No current facility-administered medications for this visit.      Abtx:  Anti-infectives (From admission, onward)   None      REVIEW OF SYSTEMS:  Const: negative fever, negative chills, negative weight loss Eyes: negative diplopia or visual changes, negative eye pain ENT: negative coryza, negative sore throat Resp: negative cough, hemoptysis, dyspnea Cards: negative for chest pain, palpitations, lower extremity edema GU: intermittent self catheterization GI: Negative for abdominal pain, diarrhea, bleeding, constipation Skin: negative for rash and pruritus Heme: negative for easy bruising and gum/nose bleeding MS: negative for myalgias, arthralgias, back pain and muscle weakness Neurolo:paraparesis Psych: negative for feelings of anxiety, depression  Endocrine: no polyuria or polydipsia Allergy/Immunology-few antibiotic allergies as above Objective:  VITALS:  BP 134/85 (BP Location: Left Arm, Patient Position: Sitting, Cuff Size: Normal)   Pulse 99   Temp 98.3 F (36.8 C) (Oral)   Ht _0  (1.854 m)   Wt 200 lb (90.7 kg)   BMI 26.39 kg/m    PHYSICAL EXAM:  General: Alert, cooperative, no distress, appears stated  age.  Head: Normocephalic, without obvious abnormality, atraumatic. Eyes: Conjunctivae clear, anicteric sclerae. Pupils are equal ENT Nares normal. No drainage or sinus tenderness. Lips, mucosa, and tongue normal. No Thrush Neck: Supple, symmetrical, no adenopathy, thyroid: non tender no carotid bruit and no JVD. Back: did not examine. Rt PICC site okay Lungs: Clear to auscultation bilaterally. No Wheezing or Rhonchi. No rales. Heart: Regular rate and rhythm, no murmur, rub or gallop. Abdomen: Soft, non-tender,not distended. Bowel sounds normal. No masses Extremities: did not examine Skin: could not examine his back wound' as he cannot get up Lymph: Cervical, supraclavicular normal. Neurologic: weakness both legs Pertinent Labs Lab Results 08/09/18 WBC 6.5 HB 12.7 PLT 188 Cr 0.58 ESR30 ( was 99 on 07/09/18) vanco trough 17.4     Microbiology: No results found for this or any previous visit (from the past 240 hour(s)).  ? Impression/Recommendation ? 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 MRSA from culture- on 6 weeks of IV vanco and ceftriaxone which he will complete on 08/18/18. ESR has declined from 99 to 34. Picture of the wound reviewed and it looks better Had surrounding yeast infection and he took fluconazole. Will see his wound at the wound clinic next Monday 8.45am. Will then be able to decide whether he needs Oral antibiotic after completion of IV on 08/18/18  Discussed the management with patient and his wife After patient left my clinic his labs from the NH were faxed to me. Could not discuss the lab results with him during the visit- Will inform him on Monday when I see him at the wound clinic. Labs are fine ? ___________________________________________________ Discussed with patient, and his wife Spoke to Mr.Hoyt at the wound clinic

## 2018-08-10 NOTE — Patient Instructions (Signed)
I reviewed the pictures of your wound taken by Dr.Cannon.  Will check on your wound when you go to the wound clinic next week. Once I have the lab results and after viewing the wound will decide whether you need to continue IV antibiotics beyond 08/18/18

## 2018-08-10 NOTE — Progress Notes (Signed)
VINICIUS, BROCKMAN ([id number]) Visit Report for 08/09/2018 Chief Complaint Document Details Patient Name: SHAHIN, KNIERIM Date of Service: 08/09/2018 8:30 AM Medical Record Number: [id number] Patient Account Number: [id number] Date of Birth/Sex: Dec 17, 1950 (67 y.o. M) Treating RN: Harold Barban Primary Care Provider: [name] Other Clinician: Referring Provider: [name] Treating Provider/Extender: [name], HOYT Weeks in Treatment: 41 Information Obtained from: Patient Chief Complaint Right gluteal fold ulcer Electronic Signature(s) Signed: 08/09/2018 9:58:15 PM By: Worthy Keeler [name] Entered By: Worthy Keeler on 08/09/2018 08:40:18 KHYRON, GARNO ([id number]) -------------------------------------------------------------------------------- HPI Details Patient Name: [name] Date of Service: 08/09/2018 8:30 AM Medical Record Number: [id number] Patient Account Number: [id number] Date of Birth/Sex: Nov 17, 1950 (67 y.o. 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 He states that there is no history of diabetes that he is aware of. He does have issues with his prostate and is currently receiving treatment for this by way of oral medication. With that being said I do not have a lot of details in that regard. Nonetheless the patient presents today as a result of having been referred to [place] by another provider initially home health was set to come out and take care of his wound although due to the fact that  he apparently drives he's not able to receive home health. His wife is therefore trying to help take care of this wound within although they have been struggling with what exactly to do at this point. She states that she can do some things but she is definitely not a nurse and does have some issues with looking at blood. The good news is the wound does not appear to be too deep and is fairly superficial at this point. There is no slough noted there is some nonviable skin noted around the surface of the wound and the perimeter at this point. The central portion of the wound appears to be very good with a dermal layer noted this does not appear to be again deep enough to extend it to subcutaneous tissue at this point. 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 In the pump he has Dilaudid and baclofen. 12/07/17 on evaluation today patient presents for follow-up concerning his ongoing lower back thigh ulcer on the right. He states that he did not get the supplies ordered and therefore has not really been able to perform the dressing changes as directed exactly. His wife was able to get some Boarder Foam Dressing's from the drugstore and subsequently has been using hydrogel which did help to a degree in the wound does appear to be able smaller. There is actually more drainage this week noted than previous. 12/21/17 on evaluation today patient appears to be doing rather well in regard to his right gluteal ulcer. He has been tolerating the dressing changes without complication. There does not appear to be any evidence of infection at this point in time. Overall the wound does seem to be making some progress as far as the edges  are concerned there's not as much in the way of overlapping of the external wound edges and he has a good epithelium to wound bed border for the most part. This however is not true  right at the 12 o'clock location over the span of a little over a centimeters which actually will require debridement today to clean this away and hopefully allow it to continue to heal more appropriately. 12/28/17 on evaluation today patient appears to be doing rather well in regard to his ulcer in the left gluteal region. He's been tolerating the dressing changes without complication. Apparently he has had some difficulty getting his dressing material. Apparently there's been some confusion with ordering we're gonna check into this. Nonetheless overall he's been showing signs of improvement which is good news. Debridement is not required today. 01/04/18 on evaluation today patient presents for follow-up concerning his right gluteal ulcer. He has been tolerating the dressing changes fairly well. On inspection today it appears he may actually have some maceration them concerned about the fact that he may be developing too much moisture in and around the wound bed which can cause delay in healing. With that being said he unfortunately really has not showed significant signs of improvement since last week's evaluation in fact this may even be just the little bit/slightly larger. Nonetheless he's been having a lot of discomfort I'm not sure this is even related to the wound as he has no pain when I'm to breeding or otherwise cleaning the wound during evaluation today. Nonetheless this is something that we did recommend he talked to his pain specialist concerning. 01/11/18 on evaluation today patient appears to be doing better in regard to his ulceration. He has been tolerating the dressing changes without complication. With that being said overall there's no evidence of infection which is good news. The only thing is he did receive the hatch affair blue classic versus the ready nonetheless I feel like this is perfectly fine and appears to have done well for him over the past week. 01/25/18 on evaluation  today patient's wound actually appears to be a little bit larger than during the last evaluation. The good MOSES, ODOHERTY. ([id number]) news is the majority of the wound edges actually appear to be fairly firmly attached to the wound bed unfortunately again we're not really making progress in regard to the size. Roughly the wound is about the same size as when I first saw him although again the wound margin/edges appear to be much better. 02/01/18 on evaluation today patient actually appears to be doing very well in regard to his wound. Applying the Prisma dry does seem to be better although he does still have issues with slow progression of the wound. There was a slight improvement compared to last week's measurements today. Nonetheless I have been considering other options as far as the possibility of Theraskin or even a snap vac. In general I'm not sure that the Theraskin due to location of the wound would be a very good idea. Nonetheless I do think that a snap vac could be a possibility for the patient and in fact I think this could even be an excellent way to manage the wound possibly seeing some improvement in a very rapid fashion here. Nonetheless this is something that we would need to get approved and I did have a lengthy conversation with the patient about this today. 02/08/18 on evaluation today patient appears to be doing a little better in regard to his ulcer.  He has been tolerating the dressing changes without complication. Fortunately despite the fact that the wound is a little bit smaller it's not significantly so unfortunately. We have discussed the possibility of a snap vac we did check with insurance this is actually covered at this point. Fortunately there does not appear to be any sign of infection. Overall I'm fairly pleased with how things seem to be appearing at this point. 02/15/18 on evaluation today patient appears to be doing rather well in regard to his right gluteal  ulcer. Unfortunately the snap vac did not stay in place with his sheer and friction this came loose and did not seem to maintain seal very well. He worked for about two days and it did seem to do very well during that time according to his wife but in general this does not seem to be something that's gonna be beneficial for him long-term. I do believe we need to go back to standard dressings to see if we can find something that will be of benefit. 03/02/18- He is here in follow up evaluation; there is minimal change in the wound. He will continue with the same treatment plan, would consider changing to iodosrob/iodoflex if ulcer continues to to plateau. He will follow up next week 03/08/18 on evaluation today patient's wound actually appears to be about the same size as when I previously saw him several weeks back. Unfortunately he does have some slightly dark discoloration in the central portion of the wound which has me concerned about pressure injury. I do believe he may be sitting for too long a period of time in fact he tells me that "I probably sit for much too long". He does have some Slough noted on the surface of the wound and again as far as the size of the wound is concerned I'm really not seeing anything that seems to have improved significantly. 03/15/18 on evaluation today patient appears to be doing fairly well in regard to his ulcer. The wound measured pretty much about the same today compared to last week's evaluation when looking at his graph. With that being said the area of bruising/deep tissue injury that was noted last week I do not see at this point. He did get a new cushion fortunately this does seem to be have been of benefit in my pinion. It does appear that he's been off of this more which is good news as well I think that is definitely showing in the overall wound measurements. With that being said I do believe that he needs to continue to offload I don't think that the fact  this is doing better should be or is going to allow him to not have to offload and explain this to him as well. Overall he seems to be in agreement the plan I think he understands. The overall appearance of the wound bed is improved compared to last week I think the Iodoflex has been beneficial in that regard. 03/29/18 on evaluation today patient actually appears to be doing rather well in regard to his wound from the overall appearance standpoint he does have some granulation although there's some Slough on the surface of the wound noted as well. With that being said he unfortunately has not improved in regard to the overall measurement of the wound in volume or in size. I did have a discussion with him very specifically about offloading today. He actually does work although he mainly is just sitting throughout the day. He tells me  he offloads by "lifting himself up for 30 seconds off of his chair occasionally" purchase from [employer] which does seem to have helped. And he has a new cushion that he with that being said he's also able to stand some for a very short period of time but not significant enough I think to provide appropriate offloading. I think the biggest issue at this point with the wound and the fact is not healing as quickly as we would like is due to the fact that he is really not able to appropriately offload while at work. He states the beginning after his injury he actually had a bed at his job that he could lay on in order to offload and that does seem to have been of help back at that time. Nonetheless he had not done this in quite some time unfortunately. I think that could be helpful for him this is something I would like for him to look into. 04/05/18 on evaluation today patient actually presents for follow-up concerning his right gluteal ulcer. Again he really is not significantly improved even compared to last week. He has been tolerating the dressing changes without  complication. With that being said fortunately there appears to be no evidence of infection at this time. He has been more proactive in trying to offload. DEANNA, BOEHLKE ([id number]) 04/12/18 on evaluation today patient actually appears to be doing a little better in regard to his wound and the right gluteal fold region. He's been tolerating the dressing changes since removing the oasis without complication. However he was having a lot of burning initially with the oasis in place. He's unsure of exactly why this was given so much discomfort but he assumes that it was the oasis itself causing the problem. Nonetheless this had to be removed after about three days in place although even those three days seem to have made a fairly good improvement in regard to the overall appearance of the wound bed. In fact is the first time that he's made any improvement from the standpoint of measurements in about six weeks. He continues to have no discomfort over the area of the wound itself which leads me to wonder why he was having the burning with the oasis when he does not even feel the actual debridement's themselves. I am somewhat perplexed by this. 04/19/18 on evaluation today patient's wound actually appears to be showing signs of epithelialization around the edge of the wound and in general actually appears to be doing better which is good news. He did have the same burning after about three days with applying the Endoform last week in the same fashion that I would generally apply a skin substitute. This seems to indicate that it's not the oasis to cause the problem but potentially the moisture buildup that just causes things to burn or there may be some other reaction with the skin prep or Steri-Strips. Nonetheless I'm not sure that is gonna be able to tolerate any skin substitute for a long period of time. The good news is the wound actually appears to be doing better today compared to last week and  does seem to finally be making some progress. 04/26/18 on evaluation today patient actually appears to be doing rather well in regard to his ulcer in the right gluteal fold. He has been tolerating the dressing changes without complication which is good news. The Endoform does seem to be helping although he was a little bit more macerated this week. This  seems to be an ongoing issue with fluid control at this point. Nonetheless I think we may be able to add something like Drawtex to help control the drainage. 05/03/18 on evaluation today patient appears to actually be doing better in regard to the overall appearance of his wound. He has been tolerating the dressing changes without complication. Fortunately there appears to be no evidence of infection at this time. I really feel like his wound has shown signs as of today of turning around last week I thought so as well and definitely he could be seen in this week's overall appearance and measurements. In general I'm very pleased with the fact that he finally seems to be making a steady but sure progress. The patient likewise is very pleased. 05/17/18 on evaluation today patient appears to be doing more poorly unfortunately in regard to his ulcer. He has been tolerating the dressing changes without complication. With that being said he tells me that in the past couple of days he and his wife have noticed that we did not seem to be doing quite as well is getting dark near the center. Subsequently upon evaluation today the wound actually does appear to be doing worse compared to previous. He has been tolerating the dressing changes otherwise and he states that he is not been sitting up anymore than he was in the past from what he tells me. Still he has continued to work he states "I'm tired of dealing with this and if I have to just go home and lay in the bed all the time that's what I'll do". Nonetheless I am concerned about the fact that this wound does  appear to be deeper than what it was previous. 05/24/18 upon evaluation today patient actually presents after having been in the hospital due to what was presumed to be sepsis secondary to the wound infection. He had an elevated white blood cell count between 14 and 15. With that being said he does seem to be doing somewhat better now. His wound still is giving him some trouble nonetheless and he is obviously concerned about the fact likely talked about that this does seem to go more deeply than previously noted. I did review his wound culture which showed evidence of Staphylococcus aureus him and group B strep. Nonetheless he is on antibiotics, Levaquin, for this. Subsequently I did review his intake summary from the hospital as well. I also did look at the CT of the lumbar spine with contrast that was performed which showed no bone destruction to suggest lumbar disguises/osteomyelitis or sacral osteomyelitis. There was no paraspinal abscess. Nonetheless it appears this may have been more of just a soft tissue infection at this point which is good news. He still is nonetheless concerned about the wound which again I think is completely reasonable considering everything he's been through recently. 05/31/18 on evaluation today on evaluation today patient actually appears to be showing signs of his wound be a little bit deeper than what I would like to see. Fortunately he does not show any signs of significant infection although his temperature was 99 today he states he's been checking this at home and has not been elevated. Nonetheless with the undermining that I'm seeing at this point I am becoming more concerned about the wound I do think that offloading is a key factor here that is preventing the speedy recovery at this point. There does not appear to be any evidence of again over infection noted. He's been using  Santyl currently. 06/07/18 the patient presents today for follow-up evaluation  regarding the left ulcer in the gluteal region. He has been tolerating the Wound VAC fairly well. He is obviously very frustrated with this he states that to mean is really getting in his way. There does not appear to be any evidence of infection at this time he does have a little bit of odor I do not necessarily associate this with infection just something that we sometimes notice with Wound VAC therapy. With that being said I can definitely catch a tone of discontentment overall in the patient's demeanor today. This when he was previously in the hospital ARSHIA, RONDON. ([id number]) an CT scan was done of the lumbar region which did not reveal any signs of osteomyelitis. With that being said the pelvis in particular was not evaluated distinctly which means he could still have some osteonecrosis I. Nonetheless the Wound VAC was started on Thursday I do want to get this little bit more time before jumping to a CT scan of the pelvis although that is something that I might would recommend if were not see an improvement by that time. 06/14/18 on evaluation today patient actually appears to be doing about the same in regard to his right gluteal ulcer. Again he did have a CT scan of the lumbar spine unfortunately this did not include the pelvis. Nonetheless with the depth of the wound that I'm seeing today even despite the fact that I'm not seeing any evidence of overt cellulitis I believe there's a good chance that we may be dealing with osteomyelitis somewhere in the right Ischial region. No fevers, chills, nausea, or vomiting noted at this time. 06/21/18 on evaluation today patient actually appears to be doing about the same with regard to his wound. The tunnel at 6 o'clock really does not appear to be any deeper although it is a little bit wider. I think at this point you may want to start packing this with white phone. Unfortunately I have not got approval for the CT scan of the pelvis as of yet  due to the fact that Medicare apparently has been denied it due to the diagnosis codes not being appropriate according to Medicare for the test requested. With that being said the patient cannot have an MRI and therefore this is the only option that we have as far as testing is concerned. The patient has had infection and was on antibiotics and been added code for cellulitis of the bottom to see if this will be appropriate for getting the test approved. Nonetheless I'm concerned about the infection have been spread deeper into the Ischial region. 06/28/18 on evaluation today patient actually appears to be doing rather well all things considered in regard to the right gluteal ulcer. He has been tolerating the dressing changes without complication. With that being said the Wound VAC he states does have to be replaced almost every day or at least reinforced unfortunately. Patient actually has his CT scan later this morning we should have the results by tomorrow. 07/05/18 on evaluation today patient presents for follow-up concerning his right Ischial ulcer. He did see the surgeon Dr. Lysle Pearl last week. They were actually very happy with him and felt like he spent a tremendous amount of time with them as far as discussing his situation was concerned. In the end Dr. Lysle Pearl did contact me as well and determine that he would not recommend any surgical intervention at this point as he felt like  it would not be in the patient's best interest based on what he was seeing. He recommended a referral to infectious disease. Subsequently this is something that Dr. Ines Bloomer office is working on setting up for the patient. As far as evaluation today is concerned the patient's wound actually appears to be worse at this point. I am concerned about how things are progressing and specifically about infection. I do not feel like it's the deeper but the area of depth is definitely widening which does have me concerned. No  fevers, chills, nausea, or vomiting noted at this time. I think that we do need initiate antibiotic therapy the patient has an allow allergy to amoxicillin/penicillin he states that he gets a rash since childhood. Nonetheless she's never had the issues with Catholics or cephalosporins in general but he is aware of. 07/27/18 on evaluation today patient presents following admission to the hospital on 07/09/18. He was subsequently discharged on 07/20/18. On 07/15/18 the patient underwent irrigation and debridement was soft tissue biopsy and bone biopsy as well as placement of a Wound VAC in the OR by Dr. Celine Ahr. During the hospital course the patient was placed on a Wound VAC and recommended follow up with surgery in three weeks actually with Dr. Delaine Lame who is infectious disease. The patient was on vancomycin during the hospital course. 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 He also had a bone culture which revealed evidence of methicillin-resistant staph aureus. He is updated CT scan 07/09/18 reveals that he had progression of the which was performed on wound to breakdown down to the trochanter where he actually had irregularities there as well suggestive of osteomyelitis. This was a change just since 9 December when we last performed a CT scan. Obviously this one had gone downhill quite significantly and rapidly. At this point upon evaluation I feel like in general the patient's wound seems to be doing fairly well all things considered upon my evaluation today. Obviously this is larger and deeper than what I previously evaluated but at the same time he seems to be making some progress as far as the appearance of the granulation tissue is concerned. I'm happy in that regard. No fevers, chills, nausea, or vomiting noted at this time. He is on IV vancomycin and Rocephin at the facility. He is currently in [employer]. 08/03/18 upon evaluation today  patient's wound appears to be doing better in regard to the overall appearance at this point in time. Fortunately he's been tolerating the Wound VAC without complication and states that the facility has been taking excellent care of the wound site. Overall I see some Slough noted on the surface which I am going to attempt sharp debridement today of but nonetheless other than this I feel like he's making progress. 08/09/18 on evaluation today patient's wound appears to be doing much better compared to even last week's evaluation. Do believe that the Wound VAC is been of great benefit for him. He has been tolerating the dressing changes that is the Wound KYEL, PURK. ([id number]) VAC without any complication and he has excellent granulation noted currently. There is no need for sharp debridement at this point. Electronic Signature(s) Signed: 08/09/2018 9:58:15 PM By: Worthy Keeler [name] Entered By: Worthy Keeler on 08/09/2018 10:38:24 JERREN, FLINCHBAUGH ([id number]) -------------------------------------------------------------------------------- Physical Exam Details Patient Name: [name] Date of Service: 08/09/2018 8:30 AM Medical Record Number: [id number] Patient Account Number: [id number] Date of Birth/Sex: Feb 22, 1951 (67 y.o.  M) Treating RN: Harold Barban Primary Care Provider: [name] Other Clinician: Referring Provider: [name] Treating Provider/Extender: [name], HOYT Weeks in Treatment: 43 Constitutional Well-nourished and well-hydrated in no acute distress. Respiratory normal breathing without difficulty. clear to auscultation bilaterally. Cardiovascular regular rate and rhythm with normal S1, S2. Psychiatric this patient is able to make decisions and demonstrates good insight into disease process. Alert and Oriented x 3. pleasant and cooperative. Notes Patient's wound bed currently again shows excellent progress since the last  evaluation and again although he does have some areas undermining and tunneling these also appear to be showing signs of improvement that were specifically the regions of bone exposure at this point. He still has what sounds to be roughly 2 weeks left of his IV antibiotic regimen. Electronic Signature(s) Signed: 08/09/2018 9:58:15 PM By: Worthy Keeler [name] Entered By: Worthy Keeler on 08/09/2018 10:38:58 DERRECK, WILTSEY ([id number]) -------------------------------------------------------------------------------- Physician Orders Details Patient Name: [name] Date of Service: 08/09/2018 8:30 AM Medical Record Number: [id number] Patient Account Number: [id number] Date of Birth/Sex: 1951/06/06 (67 y.o. 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 Primary Wound Dressing Wound #1 Right Gluteal fold o Silver Alginate - in clinic until SNF restarts NPWT Secondary Dressing Wound #1 Right Gluteal fold o Boardered Foam Dressing Dressing Change Frequency Wound #1 Right Gluteal fold o Change Dressing Monday, Wednesday, Friday - and as needed Follow-up Appointments Wound #1 Right Gluteal fold o Return Appointment in 1 week. Off-Loading Wound #1 Right Gluteal fold o Mattress -  Please continue air mattress at SNF o Turn and reposition every 2 hours Negative Pressure Wound Therapy Wound #1 Right Gluteal fold Masullo, Kord J. ([id number]) o Wound VAC settings at 125/130 mmHg continuous pressure. Use BLACK/GREEN foam to wound cavity. Use WHITE foam to fill any tunnel/s and/or undermining. Change VAC dressing 3 X WEEK. Change canister as indicated when full. Nurse may titrate settings and frequency of dressing changes as clinically indicated. - white foam to any tunnels o Home Health Nurse may d/c VAC for s/s of increased infection, significant wound regression, or uncontrolled drainage. [place] at [phone]. o Number of foam/gauze pieces used in the dressing = Medications-please add to medication list. Wound #1 Right Gluteal fold o Santyl Enzymatic Ointment - Please discontinue santyl under NPWT o Other: - IV antibiotics, difucan PO Electronic Signature(s) Signed: 08/09/2018 5:13:49 PM By: Harold Barban Signed: 08/09/2018 9:58:15 PM By: Worthy Keeler [name] Entered By: Harold Barban on 08/09/2018 09:03:22 JERICO, GRISSO ([id number]) -------------------------------------------------------------------------------- Problem List Details Patient Name: [name] Date of Service: 08/09/2018 8:30 AM Medical Record Number: [id number] Patient Account Number: [id number] Date of Birth/Sex: 10-13-1950 (67 y.o. M) Treating RN: Harold Barban Primary Care Provider: [name] Other Clinician: Referring Provider: [name] Treating Provider/Extender: [name], HOYT Weeks in Treatment: 79 Active Problems ICD-10 Evaluated Encounter Code Description Active Date Today Diagnosis L89.314 Pressure  ulcer of right buttock, stage 4 11/30/2017 No Yes L03.317 Cellulitis of buttock 06/21/2018 No Yes G82.20 Paraplegia, unspecified 11/30/2017 No Yes S34.109S Unspecified injury to unspecified level of lumbar spinal  cord, 11/30/2017 No Yes sequela I10 Essential (primary) hypertension 11/30/2017 No Yes Inactive Problems Resolved Problems Electronic Signature(s) Signed: 08/09/2018 9:58:15 PM By: Worthy Keeler [name] Entered By: Worthy Keeler on 08/09/2018 08:40:12 XAN, INGRAHAM ([id number]) -------------------------------------------------------------------------------- Progress Note Details Patient Name: [name] Date of Service: 08/09/2018 8:30 AM Medical Record Number: [id number] Patient Account Number: [id number] Date of Birth/Sex: Nov 18, 1950 (67 y.o. 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 He states that there is no history of diabetes that he is aware of. He does have issues with his prostate and is currently receiving treatment for this by way of oral medication. With that being said I do not have a lot of details in that regard. Nonetheless the patient presents today as a result of having been referred to [place] by another provider initially home health was set to come out and take care of his wound although due to the fact that he apparently drives he's not able to receive home health. His wife is therefore trying to help take care of this wound within although they have been struggling with what exactly to do at this point. She states that she can do some things but she is definitely not a nurse and does have some issues with looking at blood. The good  news is the wound does not appear to be too deep and is fairly superficial at this point. There is no slough noted there is some nonviable skin noted around the surface of the wound and the perimeter at this point. The central portion of the wound appears to be very good with a dermal layer noted this does not appear to be again deep enough to extend it to subcutaneous tissue at this point. 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 In the pump he has Dilaudid and baclofen. 12/07/17 on evaluation today patient presents for follow-up concerning his ongoing lower back thigh ulcer on the right. He states that he did not get the supplies ordered and therefore has not really been able to perform the dressing changes as directed exactly. His wife was able to get some Boarder Foam Dressing's from the drugstore and subsequently has been using hydrogel which did help to a degree in the wound does appear to be able smaller. There is actually more drainage this week noted than previous. 12/21/17 on evaluation today patient appears to be doing rather well in regard to his right gluteal ulcer. He has been tolerating the dressing changes without complication. There does not appear to be any evidence of infection at this point in time. Overall the wound does seem to be making some progress as far as the edges are concerned there's not as much in the way of overlapping of the  external wound edges and he has a good epithelium to wound bed border for the most part. This however is not true right at the 12 o'clock location over the span of a little over a centimeters which actually will require debridement today to clean this away and hopefully allow it to continue to heal more appropriately. 12/28/17 on evaluation today patient appears to be doing rather well in regard to his ulcer in the left gluteal region. He's  been tolerating the dressing changes without complication. Apparently he has had some difficulty getting his dressing material. Apparently there's been some confusion with ordering we're gonna check into this. Nonetheless overall he's been showing signs of improvement which is good news. Debridement is not required today. 01/04/18 on evaluation today patient presents for follow-up concerning his right gluteal ulcer. He has been tolerating the dressing changes fairly well. On inspection today it appears he may actually have some maceration them concerned about the fact that he may be developing too much moisture in and around the wound bed which can cause delay in healing. With that being said he unfortunately really has not showed significant signs of improvement since last week's evaluation in fact this may even be just the little bit/slightly larger. Nonetheless he's been having a lot of discomfort I'm not sure this is even related to the wound as he has no pain when I'm to breeding or otherwise cleaning the wound during evaluation today. Nonetheless this is something that we did recommend he talked to his pain specialist concerning. 01/11/18 on evaluation today patient appears to be doing better in regard to his ulceration. He has been tolerating the dressing Thornsberry, Carlyle J. ([id number]) changes without complication. With that being said overall there's no evidence of infection which is good news. The only thing is he did receive the hatch affair blue classic versus the ready nonetheless I feel like this is perfectly fine and appears to have done well for him over the past week. 01/25/18 on evaluation today patient's wound actually appears to be a little bit larger than during the last evaluation. The good news is the majority of the wound edges actually appear to be fairly firmly attached to the wound bed unfortunately again we're not really making progress in regard to the size. Roughly the  wound is about the same size as when I first saw him although again the wound margin/edges appear to be much better. 02/01/18 on evaluation today patient actually appears to be doing very well in regard to his wound. Applying the Prisma dry does seem to be better although he does still have issues with slow progression of the wound. There was a slight improvement compared to last week's measurements today. Nonetheless I have been considering other options as far as the possibility of Theraskin or even a snap vac. In general I'm not sure that the Theraskin due to location of the wound would be a very good idea. Nonetheless I do think that a snap vac could be a possibility for the patient and in fact I think this could even be an excellent way to manage the wound possibly seeing some improvement in a very rapid fashion here. Nonetheless this is something that we would need to get approved and I did have a lengthy conversation with the patient about this today. 02/08/18 on evaluation today patient appears to be doing a little better in regard to his ulcer. He has been tolerating the dressing changes without complication. Fortunately despite the fact  that the wound is a little bit smaller it's not significantly so unfortunately. We have discussed the possibility of a snap vac we did check with insurance this is actually covered at this point. Fortunately there does not appear to be any sign of infection. Overall I'm fairly pleased with how things seem to be appearing at this point. 02/15/18 on evaluation today patient appears to be doing rather well in regard to his right gluteal ulcer. Unfortunately the snap vac did not stay in place with his sheer and friction this came loose and did not seem to maintain seal very well. He worked for about two days and it did seem to do very well during that time according to his wife but in general this does not seem to be something that's gonna be beneficial for him  long-term. I do believe we need to go back to standard dressings to see if we can find something that will be of benefit. 03/02/18- He is here in follow up evaluation; there is minimal change in the wound. He will continue with the same treatment plan, would consider changing to iodosrob/iodoflex if ulcer continues to to plateau. He will follow up next week 03/08/18 on evaluation today patient's wound actually appears to be about the same size as when I previously saw him several weeks back. Unfortunately he does have some slightly dark discoloration in the central portion of the wound which has me concerned about pressure injury. I do believe he may be sitting for too long a period of time in fact he tells me that "I probably sit for much too long". He does have some Slough noted on the surface of the wound and again as far as the size of the wound is concerned I'm really not seeing anything that seems to have improved significantly. 03/15/18 on evaluation today patient appears to be doing fairly well in regard to his ulcer. The wound measured pretty much about the same today compared to last week's evaluation when looking at his graph. With that being said the area of bruising/deep tissue injury that was noted last week I do not see at this point. He did get a new cushion fortunately this does seem to be have been of benefit in my pinion. It does appear that he's been off of this more which is good news as well I think that is definitely showing in the overall wound measurements. With that being said I do believe that he needs to continue to offload I don't think that the fact this is doing better should be or is going to allow him to not have to offload and explain this to him as well. Overall he seems to be in agreement the plan I think he understands. The overall appearance of the wound bed is improved compared to last week I think the Iodoflex has been beneficial in that regard. 03/29/18 on  evaluation today patient actually appears to be doing rather well in regard to his wound from the overall appearance standpoint he does have some granulation although there's some Slough on the surface of the wound noted as well. With that being said he unfortunately has not improved in regard to the overall measurement of the wound in volume or in size. I did have a discussion with him very specifically about offloading today. He actually does work although he mainly is just sitting throughout the day. He tells me he offloads by "lifting himself up for 30 seconds off of his chair  occasionally" purchase from [employer] [facility] which does seem to have helped. And he has a new cushion that he with that being said he's also able to stand some for a very short period of time but not significant enough I think to provide appropriate offloading. I think the biggest issue at this point with the wound and the fact is not healing as quickly as we would like is due to the fact that he is really not able to appropriately offload while at work. He states the beginning after his injury he actually had a bed at his job that he could lay on in order to offload and that does seem to have been of help back at that time. Nonetheless he had not done this in quite some time unfortunately. I think that could be helpful for him this is something I would like for him to look into. JHORDAN, MCKIBBEN ([id number]) 04/05/18 on evaluation today patient actually presents for follow-up concerning his right gluteal ulcer. Again he really is not significantly improved even compared to last week. He has been tolerating the dressing changes without complication. With that being said fortunately there appears to be no evidence of infection at this time. He has been more proactive in trying to offload. 04/12/18 on evaluation today patient actually appears to be doing a little better in regard to his wound and the right gluteal  fold region. He's been tolerating the dressing changes since removing the oasis without complication. However he was having a lot of burning initially with the oasis in place. He's unsure of exactly why this was given so much discomfort but he assumes that it was the oasis itself causing the problem. Nonetheless this had to be removed after about three days in place although even those three days seem to have made a fairly good improvement in regard to the overall appearance of the wound bed. In fact is the first time that he's made any improvement from the standpoint of measurements in about six weeks. He continues to have no discomfort over the area of the wound itself which leads me to wonder why he was having the burning with the oasis when he does not even feel the actual debridement's themselves. I am somewhat perplexed by this. 04/19/18 on evaluation today patient's wound actually appears to be showing signs of epithelialization around the edge of the wound and in general actually appears to be doing better which is good news. He did have the same burning after about three days with applying the Endoform last week in the same fashion that I would generally apply a skin substitute. This seems to indicate that it's not the oasis to cause the problem but potentially the moisture buildup that just causes things to burn or there may be some other reaction with the skin prep or Steri-Strips. Nonetheless I'm not sure that is gonna be able to tolerate any skin substitute for a long period of time. The good news is the wound actually appears to be doing better today compared to last week and does seem to finally be making some progress. 04/26/18 on evaluation today patient actually appears to be doing rather well in regard to his ulcer in the right gluteal fold. He has been tolerating the dressing changes without complication which is good news. The Endoform does seem to be helping although he was a  little bit more macerated this week. This seems to be an ongoing issue with fluid control at this point. Nonetheless  I think we may be able to add something like Drawtex to help control the drainage. 05/03/18 on evaluation today patient appears to actually be doing better in regard to the overall appearance of his wound. He has been tolerating the dressing changes without complication. Fortunately there appears to be no evidence of infection at this time. I really feel like his wound has shown signs as of today of turning around last week I thought so as well and definitely he could be seen in this week's overall appearance and measurements. In general I'm very pleased with the fact that he finally seems to be making a steady but sure progress. The patient likewise is very pleased. 05/17/18 on evaluation today patient appears to be doing more poorly unfortunately in regard to his ulcer. He has been tolerating the dressing changes without complication. With that being said he tells me that in the past couple of days he and his wife have noticed that we did not seem to be doing quite as well is getting dark near the center. Subsequently upon evaluation today the wound actually does appear to be doing worse compared to previous. He has been tolerating the dressing changes otherwise and he states that he is not been sitting up anymore than he was in the past from what he tells me. Still he has continued to work he states "I'm tired of dealing with this and if I have to just go home and lay in the bed all the time that's what I'll do". Nonetheless I am concerned about the fact that this wound does appear to be deeper than what it was previous. 05/24/18 upon evaluation today patient actually presents after having been in the hospital due to what was presumed to be sepsis secondary to the wound infection. He had an elevated white blood cell count between 14 and 15. With that being said he does seem to be  doing somewhat better now. His wound still is giving him some trouble nonetheless and he is obviously concerned about the fact likely talked about that this does seem to go more deeply than previously noted. I did review his wound culture which showed evidence of Staphylococcus aureus him and group B strep. Nonetheless he is on antibiotics, Levaquin, for this. Subsequently I did review his intake summary from the hospital as well. I also did look at the CT of the lumbar spine with contrast that was performed which showed no bone destruction to suggest lumbar disguises/osteomyelitis or sacral osteomyelitis. There was no paraspinal abscess. Nonetheless it appears this may have been more of just a soft tissue infection at this point which is good news. He still is nonetheless concerned about the wound which again I think is completely reasonable considering everything he's been through recently. 05/31/18 on evaluation today on evaluation today patient actually appears to be showing signs of his wound be a little bit deeper than what I would like to see. Fortunately he does not show any signs of significant infection although his temperature was 99 today he states he's been checking this at home and has not been elevated. Nonetheless with the undermining that I'm seeing at this point I am becoming more concerned about the wound I do think that offloading is a key factor here that is preventing the speedy recovery at this point. There does not appear to be any evidence of again over infection noted. He's been using Santyl currently. DREYDON, CARDENAS ([id number]) 06/07/18 the patient presents today for follow-up  evaluation regarding the left ulcer in the gluteal region. He has been tolerating the Wound VAC fairly well. He is obviously very frustrated with this he states that to mean is really getting in his way. There does not appear to be any evidence of infection at this time he does have a little  bit of odor I do not necessarily associate this with infection just something that we sometimes notice with Wound VAC therapy. With that being said I can definitely catch a tone of discontentment overall in the patient's demeanor today. This when he was previously in the hospital an CT scan was done of the lumbar region which did not reveal any signs of osteomyelitis. With that being said the pelvis in particular was not evaluated distinctly which means he could still have some osteonecrosis I. Nonetheless the Wound VAC was started on Thursday I do want to get this little bit more time before jumping to a CT scan of the pelvis although that is something that I might would recommend if were not see an improvement by that time. 06/14/18 on evaluation today patient actually appears to be doing about the same in regard to his right gluteal ulcer. Again he did have a CT scan of the lumbar spine unfortunately this did not include the pelvis. Nonetheless with the depth of the wound that I'm seeing today even despite the fact that I'm not seeing any evidence of overt cellulitis I believe there's a good chance that we may be dealing with osteomyelitis somewhere in the right Ischial region. No fevers, chills, nausea, or vomiting noted at this time. 06/21/18 on evaluation today patient actually appears to be doing about the same with regard to his wound. The tunnel at 6 o'clock really does not appear to be any deeper although it is a little bit wider. I think at this point you may want to start packing this with white phone. Unfortunately I have not got approval for the CT scan of the pelvis as of yet due to the fact that Medicare apparently has been denied it due to the diagnosis codes not being appropriate according to Medicare for the test requested. With that being said the patient cannot have an MRI and therefore this is the only option that we have as far as testing is concerned. The patient has had  infection and was on antibiotics and been added code for cellulitis of the bottom to see if this will be appropriate for getting the test approved. Nonetheless I'm concerned about the infection have been spread deeper into the Ischial region. 06/28/18 on evaluation today patient actually appears to be doing rather well all things considered in regard to the right gluteal ulcer. He has been tolerating the dressing changes without complication. With that being said the Wound VAC he states does have to be replaced almost every day or at least reinforced unfortunately. Patient actually has his CT scan later this morning we should have the results by tomorrow. 07/05/18 on evaluation today patient presents for follow-up concerning his right Ischial ulcer. He did see the surgeon Dr. Lysle Pearl last week. They were actually very happy with him and felt like he spent a tremendous amount of time with them as far as discussing his situation was concerned. In the end Dr. Lysle Pearl did contact me as well and determine that he would not recommend any surgical intervention at this point as he felt like it would not be in the patient's best interest based on what he  was seeing. He recommended a referral to infectious disease. Subsequently this is something that Dr. Ines Bloomer office is working on setting up for the patient. As far as evaluation today is concerned the patient's wound actually appears to be worse at this point. I am concerned about how things are progressing and specifically about infection. I do not feel like it's the deeper but the area of depth is definitely widening which does have me concerned. No fevers, chills, nausea, or vomiting noted at this time. I think that we do need initiate antibiotic therapy the patient has an allow allergy to amoxicillin/penicillin he states that he gets a rash since childhood. Nonetheless she's never had the issues with Catholics or cephalosporins in general but he is aware  of. 07/27/18 on evaluation today patient presents following admission to the hospital on 07/09/18. He was subsequently discharged on 07/20/18. On 07/15/18 the patient underwent irrigation and debridement was soft tissue biopsy and bone biopsy as well as placement of a Wound VAC in the OR by Dr. Celine Ahr. During the hospital course the patient was placed on a Wound VAC and recommended follow up with surgery in three weeks actually with Dr. Delaine Lame who is infectious disease. The patient was on vancomycin during the hospital course. 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 He also had a bone culture which revealed evidence of methicillin-resistant staph aureus. He is updated CT scan 07/09/18 reveals that he had progression of the which was performed on wound to breakdown down to the trochanter where he actually had irregularities there as well suggestive of osteomyelitis. This was a change just since 9 December when we last performed a CT scan. Obviously this one had gone downhill quite significantly and rapidly. At this point upon evaluation I feel like in general the patient's wound seems to be doing fairly well all things considered upon my evaluation today. Obviously this is larger and deeper than what I previously evaluated but at the same time he seems to be making some progress as far as the appearance of the granulation tissue is concerned. I'm happy in that regard. No fevers, chills, nausea, or vomiting noted at this time. He is on IV vancomycin and Rocephin at the facility. He is currently in [facility]. 08/03/18 upon evaluation today patient's wound appears to be doing better in regard to the overall appearance at this point in time. Fortunately he's been tolerating the Wound VAC without complication and states that the facility has been taking JOAS, MOTTON. ([id number]) excellent care of the wound site. Overall I see some Slough noted on the  surface which I am going to attempt sharp debridement today of but nonetheless other than this I feel like he's making progress. 08/09/18 on evaluation today patient's wound appears to be doing much better compared to even last week's evaluation. Do believe that the Wound VAC is been of great benefit for him. He has been tolerating the dressing changes that is the Wound VAC without any complication and he has excellent granulation noted currently. There is no need for sharp debridement at this point. Patient History Information obtained from Patient. Family History Hypertension - Father, Stroke - Mother, No family history of Cancer, Diabetes, Heart Disease, Kidney Disease, Lung Disease, Seizures, Thyroid Problems, Tuberculosis. Social History Never smoker, Marital Status - Married, Alcohol Use - Never, Drug Use - No History, Caffeine Use - Daily. Medical And Surgical History Notes Oncologic Prostate cancer- currently treated with horomone therapy Review  of Systems (ROS) Constitutional Symptoms (General Health) Denies complaints or symptoms of Fever, Chills. Respiratory The patient has no complaints or symptoms. Cardiovascular The patient has no complaints or symptoms. Psychiatric The patient has no complaints or symptoms. Objective Constitutional Well-nourished and well-hydrated in no acute distress. Vitals Time Taken: 8:25 AM, Height: 73 in, Weight: 210 lbs, BMI: 27.7, Temperature: 98.2 F, Pulse: 88 bpm, Respiratory Rate: 18 breaths/min, Blood Pressure: 148/69 mmHg. Respiratory normal breathing without difficulty. clear to auscultation bilaterally. Cardiovascular regular rate and rhythm with normal S1, S2. Psychiatric this patient is able to make decisions and demonstrates good insight into disease process. Alert and Oriented x 3. pleasant and cooperative. KOLTER, REAVER ([id number]) General Notes: Patient's wound bed currently again shows excellent progress since the  last evaluation and again although he does have some areas undermining and tunneling these also appear to be showing signs of improvement that were specifically the regions of bone exposure at this point. He still has what sounds to be roughly 2 weeks left of his IV antibiotic regimen. Integumentary (Hair, Skin) Wound #1 status is Open. Original cause of wound was Pressure Injury. The wound is located on the Right Gluteal fold. The wound measures 6cm length x 1.2cm width x 4cm depth; 5.655cm^2 area and 22.619cm^3 volume. There is Fat Layer (Subcutaneous Tissue) Exposed exposed. There is no undermining noted, however, there is tunneling at 2:00 with a maximum distance of 5.2cm. There is additional tunneling and at 6:00 with a maximum distance of 4.5cm. There is a medium amount of sanguinous drainage noted. The wound margin is epibole. There is large (67-100%) red, pink, hyper - granulation within the wound bed. There is a small (1-33%) amount of necrotic tissue within the wound bed including Adherent Slough. The periwound skin appearance exhibited: Rash. The periwound skin appearance did not exhibit: Callus, Crepitus, Excoriation, Induration, Scarring, Dry/Scaly, Maceration, Atrophie Blanche, Cyanosis, Ecchymosis, Hemosiderin Staining, Mottled, Pallor, Rubor, Erythema. Periwound temperature was noted as No Abnormality. 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 Primary Wound Dressing: Wound #1 Right Gluteal fold: Silver Alginate - in clinic until SNF restarts  NPWT Secondary Dressing: Wound #1 Right Gluteal fold: Boardered Foam Dressing Dressing Change Frequency: Wound #1 Right Gluteal fold: Change Dressing Monday, Wednesday, Friday - and as needed Follow-up Appointments: Wound #1 Right Gluteal fold: Return Appointment in 1 week. Off-Loading: WAGNER, TANZI ([id number]) Wound #1 Right Gluteal fold: Mattress - Please continue air mattress at SNF Turn and reposition every 2 hours Negative Pressure Wound Therapy: Wound #1 Right Gluteal fold: Wound VAC settings at 125/130 mmHg continuous pressure. Use BLACK/GREEN foam to wound cavity. Use WHITE foam to fill any tunnel/s and/or undermining. Change VAC dressing 3 X WEEK. Change canister as indicated when full. Nurse may titrate settings and frequency of dressing changes as clinically indicated. - white foam to any tunnels Home Health Nurse may d/c VAC for s/s of increased infection, significant wound regression, or uncontrolled drainage. [place] at [phone]. Number of foam/gauze pieces used in the dressing = Medications-please add to medication list.: Wound #1 Right Gluteal fold: Santyl Enzymatic Ointment - Please discontinue santyl under  NPWT Other: - IV antibiotics, difucan PO My suggestion at this point is gonna be that we continue with the Wound VAC therapy he has done very well to this point. The patient is in agreement that plan. We will subsequently see were things stand at follow-up. Please see above for specific wound care orders. We will see patient for re-evaluation in 1 week(s) here in the clinic. If anything worsens or changes patient will contact our office for additional recommendations. I did discuss with the patient that I have not seen any of his studies as far as the sed rate nor C-reactive protein are concerned. Nonetheless this will be the markers that are most used in order to determine whether or not he is responding well to the IV antibiotic  therapy and where his infection is at this point. He is going to see about getting these faxed to our office for me to review for him as well. Also advised him he needs to make an appointment with Dr. Delaine Lame his infectious disease specialist as well. Electronic Signature(s) Signed: 08/09/2018 9:58:15 PM By: Worthy Keeler [name] Entered By: Worthy Keeler on 08/09/2018 10:40:19 RAND, ETCHISON ([id number]) -------------------------------------------------------------------------------- ROS/PFSH Details Patient Name: [name] Date of Service: 08/09/2018 8:30 AM Medical Record Number: [id number] Patient Account Number: [id number] Date of Birth/Sex: 04/23/1951 (67 y.o. M) Treating RN: Harold Barban Primary Care Provider: [name] Other Clinician: Referring Provider: [name] Treating Provider/Extender: [name], HOYT Weeks in Treatment: 62 Information Obtained From Patient Wound History Do you currently have one or more open woundso Yes How many open wounds do you currently haveo 1 Approximately how long have you had your woundso 1 month How have you been treating your wound(s) until nowo cream Has your wound(s) ever healed and then re-openedo No Have you had any lab work done in the past montho No Have you tested positive for an antibiotic resistant organism (MRSA, VRE)o No Have you tested positive for osteomyelitis (bone infection)o No Have you had any tests for circulation on your legso No Constitutional Symptoms (General Health) Complaints and Symptoms: Negative for: Fever; Chills Eyes Medical History: Positive for: Cataracts - both removed Negative for: Glaucoma; Optic Neuritis Ear/Nose/Mouth/Throat Medical History: Negative for: Chronic sinus problems/congestion; Middle ear problems Hematologic/Lymphatic Medical History: Negative for: Anemia; Hemophilia; Human Immunodeficiency Virus; Lymphedema Respiratory Complaints and  Symptoms: No Complaints or Symptoms Medical History: Negative for: Aspiration; Asthma; Chronic Obstructive Pulmonary Disease (COPD); Pneumothorax; Sleep Apnea; Tuberculosis Cardiovascular Complaints and Symptoms: No Complaints or Symptoms Medical History: JERRION, TABBERT ([id number]) Positive for: Hypertension - takes medication Negative for: Angina; Arrhythmia; Congestive Heart Failure; Coronary Artery Disease; Deep Vein Thrombosis; Hypotension; Myocardial Infarction; Peripheral Arterial Disease; Peripheral Venous Disease; Phlebitis; Vasculitis Gastrointestinal Medical History: Negative for: Cirrhosis ; Colitis; Crohnos; Hepatitis A; Hepatitis B; Hepatitis C Endocrine Medical History: Negative for: Type I Diabetes; Type II Diabetes Genitourinary Medical History: Negative for: End Stage Renal Disease Immunological Medical History: Negative for: Lupus Erythematosus; Raynaudos; Scleroderma Integumentary (Skin) Medical History: Negative for: History of Burn; History of pressure wounds Musculoskeletal Medical History: Negative for: Gout; Rheumatoid Arthritis; Osteoarthritis; Osteomyelitis Neurologic Medical History: Positive for: Paraplegia - waist down Negative for: Dementia; Neuropathy; Quadriplegia; Seizure Disorder Oncologic Medical History: Negative for: Received Chemotherapy; Received Radiation Past Medical History Notes: Prostate cancer- currently treated with horomone therapy Psychiatric Complaints and Symptoms: No Complaints or Symptoms Medical History: Negative for: Anorexia/bulimia; Confinement Anxiety HBO Extended History Items Eyes: Cataracts WALTON, DIGILIO. ([id number]) Immunizations Pneumococcal Vaccine:  Received Pneumococcal Vaccination: No Implantable Devices Family and Social History Cancer: No; Diabetes: No; Heart Disease: No; Hypertension: Yes - Father; Kidney Disease: No; Lung Disease: No; Seizures: No; Stroke: Yes - Mother; Thyroid  Problems: No; Tuberculosis: No; Never smoker; Marital Status - Married; Alcohol Use: Never; Drug Use: No History; Caffeine Use: Daily; Advanced Directives: Yes (Copy provided); Patient does not want information on Advanced Directives; Living Will: Yes (Copy provided) Physician Affirmation I have reviewed and agree with the above information. Electronic Signature(s) Signed: 08/09/2018 5:13:49 PM By: Harold Barban Signed: 08/09/2018 9:58:15 PM By: Worthy Keeler [name] Entered By: Worthy Keeler on 08/09/2018 10:38:43 JAMICHAEL, KNOTTS ([id number]) -------------------------------------------------------------------------------- SuperBill Details Patient Name: [name] Date of Service: 08/09/2018 Medical Record Number: [id number] Patient Account Number: [id number] Date of Birth/Sex: 11/19/1950 (67 y.o. 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, sequela I10 Essential (primary) hypertension Facility Procedures CPT4 Code: 98264158 Description: 99213 - WOUND CARE VISIT-LEV 3 EST PT Modifier: Quantity: 1 Physician Procedures CPT4 Code: 3094076 Description: 80881 - WC PHYS LEVEL 4 - EST PT ICD-10 Diagnosis Description L89.314 Pressure ulcer of right buttock, stage 4 L03.317 Cellulitis of buttock G82.20 Paraplegia, unspecified S34.109S Unspecified injury to unspecified level of lumbar spinal cor Modifier: d, sequela Quantity: 1 Electronic Signature(s) Signed: 08/09/2018 9:58:15 PM By: Worthy Keeler [name] Entered By: Worthy Keeler on 08/09/2018 10:40:53

## 2018-08-16 ENCOUNTER — Encounter: Payer: Medicare Other | Admitting: Physician Assistant

## 2018-08-16 DIAGNOSIS — L89314 Pressure ulcer of right buttock, stage 4: Secondary | ICD-10-CM | POA: Diagnosis not present

## 2018-08-22 NOTE — Progress Notes (Signed)
[name], [name] ([id number]) Visit Report for 08/16/2018 Arrival Information Details Patient Name: [name] Date of Service: 08/16/2018 8:30 AM Medical Record Number: [id number] Patient Account Number: [id number] Date of Birth/Sex: [birth year] (67 y.o. M) Treating RN: [name] Primary Care [name]: [name] Other Clinician: Referring [name]: [name] [name] Weeks in Treatment: 50 Visit Information History Since Last Visit Added or deleted any medications: No Patient Arrived: Wheel Chair Any new allergies or adverse reactions: No Arrival Time: 08:31 Had a fall or experienced change in No Accompanied By: wife activities of daily living that may affect Transfer Assistance: None risk of falls: Patient Identification Verified: Yes Signs or symptoms of abuse/neglect since last visito No Secondary Verification Process Completed: Yes Hospitalized since last visit: No Patient Requires Transmission-Based No Implantable device outside of the clinic excluding No Precautions: cellular tissue based products placed in the center Patient Has Alerts: Yes since last visit: Patient Alerts: NOT Has Dressing in Place as Prescribed: Yes Diabetic Pain Present Now: Yes Electronic Signature(s) Signed: 08/16/2018 2:36:35 PM By: [name] RCP, RRT, CHT Entered By: [name] on 08/16/2018 08:32:05 [name] ([id number]) -------------------------------------------------------------------------------- Clinic Level of Care Assessment Details Patient Name: [name] Date of Service: 08/16/2018 8:30 AM Medical Record Number: [id number] Patient Account Number: [id number] Date of Birth/Sex: 1951-02-12 (67 y.o. M) Treating RN: [name] Primary Care [name]: [name] Other Clinician: Referring [name]: [name] [name] Weeks  in Treatment: 37 Clinic Level of Care Assessment Items TOOL 4 Quantity Score []  - Use when only an EandM is performed on FOLLOW-UP visit 0 ASSESSMENTS - Nursing Assessment / Reassessment []  - Reassessment of Co-morbidities (includes updates in patient status) 0 X- 1 5 Reassessment of Adherence to Treatment Plan ASSESSMENTS - Wound and Skin Assessment / Reassessment X - Simple Wound Assessment / Reassessment - one wound 1 5 []  - 0 Complex Wound Assessment / Reassessment - multiple wounds []  - 0 Dermatologic / Skin Assessment (not related to wound area) ASSESSMENTS - Focused Assessment []  - Circumferential Edema Measurements - multi extremities 0 []  - 0 Nutritional Assessment / Counseling / Intervention []  - 0 Lower Extremity Assessment (monofilament, tuning fork, pulses) []  - 0 Peripheral Arterial Disease Assessment (using hand held doppler) ASSESSMENTS - Ostomy and/or Continence Assessment and Care []  - Incontinence Assessment and Management 0 []  - 0 Ostomy Care Assessment and Management (repouching, etc.) PROCESS - Coordination of Care X - Simple Patient / Family Education for ongoing care 1 15 []  - 0 Complex (extensive) Patient / Family Education for ongoing care X- 1 10 Staff obtains Programmer, systems, Records, Test Results / Process Orders []  - 0 Staff telephones HHA, Nursing Homes / Clarify orders / etc []  - 0 Routine Transfer to another Facility (non-emergent condition) []  - 0 Routine Hospital Admission (non-emergent condition) []  - 0 New Admissions / Biomedical engineer / Ordering NPWT, Apligraf, etc. []  - 0 Emergency Hospital Admission (emergent condition) X- 1 10 Simple Discharge Coordination [name], [name]. ([id number]) []  - 0 Complex (extensive) Discharge Coordination PROCESS - Special Needs []  - Pediatric / Minor Patient Management 0 []  - 0 Isolation Patient Management []  - 0 Hearing / Language / Visual special needs []  - 0 Assessment of Community  assistance (transportation, D/C planning, etc.) []  - 0 Additional assistance / Altered mentation []  - 0 Support Surface(s) Assessment (bed, cushion, seat, etc.) INTERVENTIONS - Wound Cleansing / Measurement X - Simple Wound Cleansing -  one wound 1 5 []  - 0 Complex Wound Cleansing - multiple wounds X- 1 5 Wound Imaging (photographs - any number of wounds) []  - 0 Wound Tracing (instead of photographs) X- 1 5 Simple Wound Measurement - one wound []  - 0 Complex Wound Measurement - multiple wounds INTERVENTIONS - Wound Dressings []  - Small Wound Dressing one or multiple wounds 0 X- 1 15 Medium Wound Dressing one or multiple wounds []  - 0 Large Wound Dressing one or multiple wounds []  - 0 Application of Medications - topical []  - 0 Application of Medications - injection INTERVENTIONS - Miscellaneous []  - External ear exam 0 []  - 0 Specimen Collection (cultures, biopsies, blood, body fluids, etc.) []  - 0 Specimen(s) / Culture(s) sent or taken to Lab for analysis []  - 0 Patient Transfer (multiple staff / Civil Service fast streamer / Similar devices) []  - 0 Simple Staple / Suture removal (25 or less) []  - 0 Complex Staple / Suture removal (26 or more) []  - 0 Hypo / Hyperglycemic Management (close monitor of Blood Glucose) []  - 0 Ankle / Brachial Index (ABI) - do not check if billed separately X- 1 5 Vital Signs [name], [name] ([id number]) Has the patient been seen at the hospital within the last three years: Yes Total Score: 80 Level Of Care: New/Established - Level 3 Electronic Signature(s) Signed: 08/16/2018 4:45:46 PM By: [name], BSN, RN, CWS, [name] Entered By: [name], BSN, RN, CWS, Brett on 08/16/2018 08:58:44 [name] ([id number]) -------------------------------------------------------------------------------- Encounter Discharge Information Details Patient Name: [name] Date of Service: 08/16/2018 8:30 AM Medical Record Number: [id number] Patient  Account Number: [id number] Date of Birth/Sex: March 07, 1951 (67 y.o. M) Treating RN: [name] Primary Care [name]: [name] Other Clinician: Referring [name]: [name] Treating Kassey Laforest/Extender: [name] Weeks in Treatment: 39 Encounter Discharge Information Items Discharge Condition: Stable Ambulatory Status: Wheelchair Discharge Destination: Home Transportation: Private Auto Accompanied By: self Schedule Follow-up Appointment: Yes Clinical Summary of Care: Electronic Signature(s) Signed: 08/16/2018 4:45:46 PM By: [name], BSN, RN, CWS, [name] Entered By: [name], BSN, RN, CWS, Brett on 08/16/2018 09:00:48 [name], [name] ([id number]) -------------------------------------------------------------------------------- Lower Extremity Assessment Details Patient Name: [name] Date of Service: 08/16/2018 8:30 AM Medical Record Number: [id number] Patient Account Number: [id number] Date of Birth/Sex: [birth year] (67 y.o. M) Treating RN: Montey Hora Primary Care Lamees Gable: [name] Other Clinician: Referring Orange Hilligoss: [name] Treating Ahmari Duerson/Extender: [name] Weeks in Treatment: 37 Electronic Signature(s) Signed: 08/16/2018 4:38:40 PM By: Montey Hora Entered By: Montey Hora on 08/16/2018 08:46:56 Tarver, [name] ([id number]) -------------------------------------------------------------------------------- Multi Wound Chart Details Patient Name: [name] Date of Service: 08/16/2018 8:30 AM Medical Record Number: [id number] Patient Account Number: [id number] Date of Birth/Sex: 1951/06/24 (67 y.o. M) Treating RN: [name] Primary Care Meredyth Hornung: [name] Other Clinician: Referring Ulis Kaps: [name] Treating Mir Fullilove/Extender: [name] Weeks in Treatment: 37 Vital Signs Height(in): 73 Pulse(bpm): 92 Weight(lbs): 210 Blood Pressure(mmHg): 143/70 Body Mass Index(BMI):  28 Temperature(F): 99.3 Respiratory Rate 18 (breaths/min): Photos: [1:No Photos] [N/A:N/A] Wound Location: [1:Right Gluteal fold] [N/A:N/A] Wounding Event: [1:Pressure Injury] [N/A:N/A] Primary Etiology: [1:Pressure Ulcer] [N/A:N/A] Comorbid History: [1:Cataracts, Hypertension, Paraplegia] [N/A:N/A] Date Acquired: [1:11/02/2017] [N/A:N/A] Weeks of Treatment: [1:37] [N/A:N/A] Wound Status: [1:Open] [N/A:N/A] Measurements L x W x D [1:6x1.9x3.7] [N/A:N/A] (cm) Area (cm) : [1:8.954] [N/A:N/A] Volume (cm) : [1:33.128] [N/A:N/A] % Reduction in Area: [1:10.90%] [N/A:N/A] % Reduction in Volume: [1:-3196.30%] [N/A:N/A] Classification: [1:Category/Stage IV] [N/A:N/A] Exudate Amount: [1:Medium] [N/A:N/A] Exudate Type: [1:Serosanguineous] [  N/A:N/A] Exudate Color: [1:red, brown] [N/A:N/A] Wound Margin: [1:Epibole] [N/A:N/A] Granulation Amount: [1:Large (67-100%)] [N/A:N/A] Granulation Quality: [1:Red, Pink, Hyper-granulation] [N/A:N/A] Necrotic Amount: [1:Small (1-33%)] [N/A:N/A] Exposed Structures: [1:Fat Layer (Subcutaneous Tissue) Exposed: Yes Fascia: No Tendon: No Muscle: No Joint: No Bone: No] [N/A:N/A] Epithelialization: [1:Small (1-33%)] [N/A:N/A] Periwound Skin Texture: [1:Rash: Yes Excoriation: No Induration: No Callus: No Crepitus: No Scarring: No] [N/A:N/A] Periwound Skin Moisture: Maceration: No N/A N/A Dry/Scaly: No Periwound Skin Color: Atrophie Blanche: No N/A N/A Cyanosis: No Ecchymosis: No Erythema: No Hemosiderin Staining: No Mottled: No Pallor: No Rubor: No Temperature: No Abnormality N/A N/A Tenderness on Palpation: No N/A N/A Wound Preparation: Ulcer Cleansing: N/A N/A Rinsed/Irrigated with Saline Topical Anesthetic Applied: Other: lidocaine 4% Treatment Notes Electronic Signature(s) Signed: 08/16/2018 4:45:46 PM By: [name], BSN, RN, CWS, [name] Entered By: [name], BSN, RN, CWS, Brett on 08/16/2018 08:55:20 [name], [name]  ([id number]) -------------------------------------------------------------------------------- Multi-Disciplinary Care Plan Details Patient Name: [name] Date of Service: 08/16/2018 8:30 AM Medical Record Number: [id number] Patient Account Number: [id number] Date of Birth/Sex: 18-Jul-1951 (67 y.o. M) Treating RN: [name] Primary Care Alma Muegge: [name] Other Clinician: Referring Leshawn Straka: [name] Treating Adina Puzzo/Extender: [name] Weeks in Treatment: 28 Active Inactive Orientation to the Wound Care Program Nursing Diagnoses: Knowledge deficit related to the wound healing center program Goals: Patient/caregiver will verbalize understanding of the [facility] Program Date Initiated: 11/30/2017 Target Resolution Date: 12/21/2017 Goal Status: Active Interventions: Provide education on orientation to the wound center Notes: Pressure Nursing Diagnoses: Knowledge deficit related to management of pressures ulcers Goals: Patient/caregiver will verbalize understanding of pressure ulcer management Date Initiated: 05/17/2018 Target Resolution Date: 05/28/2018 Goal Status: Active Interventions: Assess: immobility, friction, shearing, incontinence upon admission and as needed Notes: Wound/Skin Impairment Nursing Diagnoses: Impaired tissue integrity Goals: Patient/caregiver will verbalize understanding of skin care regimen Date Initiated: 11/30/2017 Target Resolution Date: 12/21/2017 Goal Status: Active Ulcer/skin breakdown will have a volume reduction of 30% by week 4 Date Initiated: 11/30/2017 Target Resolution Date: 12/21/2017 Goal Status: Active [name], [name] ([id number]) Interventions: Assess patient/caregiver ability to obtain necessary supplies Assess patient/caregiver ability to perform ulcer/skin care regimen upon admission and as needed Assess ulceration(s) every visit Treatment Activities: Skin care regimen initiated :  11/30/2017 Notes: Electronic Signature(s) Signed: 08/16/2018 4:45:46 PM By: [name], BSN, RN, CWS, [name] Entered By: [name], BSN, RN, CWS, Brett on 08/16/2018 08:55:13 [name], [name] ([id number]) -------------------------------------------------------------------------------- Pain Assessment Details Patient Name: [name] Date of Service: 08/16/2018 8:30 AM Medical Record Number: [id number] Patient Account Number: [id number] Date of Birth/Sex: [birth year] (67 y.o. M) Treating RN: [name] Primary Care Argusta Mcgann: [name] Other Clinician: Referring Naydene Kamrowski: [name] Treating Hisao Doo/Extender: [name] Weeks in Treatment: 37 Active Problems Location of Pain Severity and Description of Pain Patient Has Paino Yes Site Locations Rate the pain. Current Pain Level: 5 Pain Management and Medication Current Pain Management: Electronic Signature(s) Signed: 08/16/2018 2:36:35 PM By: [name] RCP, RRT, CHT Signed: 08/16/2018 4:45:46 PM By: [name], BSN, RN, CWS, [name] Entered By: [name] on 08/16/2018 08:32:16 [name], [name] ([id number]) -------------------------------------------------------------------------------- Patient/Caregiver Education Details Patient Name: [name] Date of Service: 08/16/2018 8:30 AM Medical Record Number: [id number] Patient Account Number: [id number] Date of Birth/Gender: 06/11/1951 (67 y.o. M) Treating RN: [name] Primary Care Physician: [name] Other Clinician: Referring Physician: Tracie [name] Treating Physician/Extender: Sharalyn Ink in Treatment: 34 Education Assessment Education Provided To: Patient Education Topics Provided  Offloading: Handouts: Other: Do not silde on wounded area Methods: Demonstration, Explain/Verbal Responses: State content correctly Wound/Skin Impairment: Handouts: Caring for Your Ulcer, Other: SNF  note sent with patient Methods: Demonstration, Explain/Verbal Responses: State content correctly Electronic Signature(s) Signed: 08/16/2018 4:45:46 PM By: [name], BSN, RN, CWS, [name] Entered By: [name], BSN, RN, CWS, Brett on 08/16/2018 09:00:08 [name], [name] ([id number]) -------------------------------------------------------------------------------- Wound Assessment Details Patient Name: [name] Date of Service: 08/16/2018 8:30 AM Medical Record Number: [id number] Patient Account Number: [id number] Date of Birth/Sex: 08-17-1950 (67 y.o. M) Treating RN: Montey Hora Primary Care Octivia Canion: [name] Other Clinician: Referring Lanie Schelling: [name] Treating Taleeya Blondin/Extender: [name], [name] Weeks in Treatment: 37 Wound Status Wound Number: 1 Primary Etiology: Pressure Ulcer Wound Location: Right Gluteal fold Wound Status: Open Wounding Event: Pressure Injury Comorbid History: Cataracts, Hypertension, Paraplegia Date Acquired: 11/02/2017 Weeks Of Treatment: 37 Clustered Wound: No Photos Photo Uploaded By: Secundino Ginger on 08/16/2018 10:32:57 Wound Measurements Length: (cm) 6 Width: (cm) 1.9 Depth: (cm) 3.7 Area: (cm) 8.954 Volume: (cm) 33.128 % Reduction in Area: 10.9% % Reduction in Volume: -3196.3% Epithelialization: Small (1-33%) Tunneling: No Undermining: No Wound Description Classification: Category/Stage IV Foul Odo Wound Margin: Epibole Slough/F Exudate Amount: Medium Exudate Type: Serosanguineous Exudate Color: red, brown r After Cleansing: No ibrino Yes Wound Bed Granulation Amount: Large (67-100%) Exposed Structure Granulation Quality: Red, Pink, Hyper-granulation Fascia Exposed: No Necrotic Amount: Small (1-33%) Fat Layer (Subcutaneous Tissue) Exposed: Yes Necrotic Quality: Adherent Slough Tendon Exposed: No Muscle Exposed: No Joint Exposed: No Bone Exposed: No Periwound Skin Texture [name], [name]  ([id number]) Texture Color No Abnormalities Noted: No No Abnormalities Noted: No Callus: No Atrophie Blanche: No Crepitus: No Cyanosis: No Excoriation: No Ecchymosis: No Induration: No Erythema: No Rash: Yes Hemosiderin Staining: No Scarring: No Mottled: No Pallor: No Moisture Rubor: No No Abnormalities Noted: No Dry / Scaly: No Temperature / Pain Maceration: No Temperature: No Abnormality Wound Preparation Ulcer Cleansing: Rinsed/Irrigated with Saline Topical Anesthetic Applied: Other: lidocaine 4%, Treatment Notes Wound #1 (Right Gluteal fold) Notes silvercel, bordered foam dressing; SNF nurses to reapply NPWT Electronic Signature(s) Signed: 08/16/2018 2:52:40 PM By: Secundino Ginger Signed: 08/16/2018 4:38:40 PM By: Montey Hora Entered By: Secundino Ginger on 08/16/2018 08:53:50 [name], [name] ([id number]) -------------------------------------------------------------------------------- Vitals Details Patient Name: [name] Date of Service: 08/16/2018 8:30 AM Medical Record Number: [id number] Patient Account Number: [id number] Date of Birth/Sex: [birth year] (67 y.o. M) Treating RN: [name] Primary Care Aftin Lye: [name] Other Clinician: Referring Daekwon Beswick: [name] Treating Corda Shutt/Extender: [name] Weeks in Treatment: 37 Vital Signs Time Taken: 08:32 Temperature (F): 99.3 Height (in): 73 Pulse (bpm): 92 Weight (lbs): 210 Respiratory Rate (breaths/min): 18 Body Mass Index (BMI): 27.7 Blood Pressure (mmHg): 143/70 Reference Range: 80 - 120 mg / dl Airway Electronic Signature(s) Signed: 08/16/2018 2:36:35 PM By: [name] RCP, RRT, CHT Entered By: [name] on 08/16/2018 08:34:13

## 2018-08-22 NOTE — Progress Notes (Signed)
ROMMIE, DUNN ([id number]) Visit Report for 08/16/2018 Chief Complaint Document Details Patient Name: [name], [name] Date of Service: 08/16/2018 8:30 AM Medical Record Number: [id number] Patient Account Number: [id number] Date of Birth/Sex: 06-27-1951 (67 y.o. M) Treating RN: [name] Primary Care Provider: [name] Other Clinician: Referring Provider: [name] Treating Provider/Extender: [name], HOYT Weeks in Treatment: 62 Information Obtained from: Patient Chief Complaint Right gluteal fold ulcer Electronic Signature(s) Signed: 08/20/2018 9:05:33 PM By: Worthy Keeler [name] Entered By: Worthy Keeler on 08/16/2018 08:27:21 [name], [name] ([id number]) -------------------------------------------------------------------------------- HPI Details Patient Name: [name] Date of Service: 08/16/2018 8:30 AM Medical Record Number: [id number] Patient Account Number: [id number] Date of Birth/Sex: 21-Feb-1951 (67 y.o. 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 He states that there is no history of diabetes that he is aware of. He does have issues with his prostate and is currently receiving treatment for this by way of oral medication. With that being said I do not have a lot of details in that regard. Nonetheless the patient presents today as a result of having been referred to [place] by another provider initially home health was set to come out and take care of his wound although due to the fact that he  apparently drives he's not able to receive home health. His wife is therefore trying to help take care of this wound within although they have been struggling with what exactly to do at this point. She states that she can do some things but she is definitely not a nurse and does have some issues with looking at blood. The good news is the wound does not appear to be too deep and is fairly superficial at this point. There is no slough noted there is some nonviable skin noted around the surface of the wound and the perimeter at this point. The central portion of the wound appears to be very good with a dermal layer noted this does not appear to be again deep enough to extend it to subcutaneous tissue at this point. 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 In the pump he has Dilaudid and baclofen. 12/07/17 on evaluation today patient presents for follow-up concerning his ongoing lower back thigh ulcer on the right. He states that he did not get the supplies ordered and therefore has not really been able to perform the dressing changes as directed exactly. His wife was able to get some Boarder Foam Dressing's from the drugstore and subsequently has been using hydrogel which did help to a degree in the wound does appear to be able smaller. There is actually more drainage this week noted than previous. 12/21/17 on evaluation today patient appears to be doing rather well in regard to his right gluteal ulcer. He has been tolerating the dressing changes without complication. There does not appear to be any evidence of infection at this point in time. Overall the wound does seem to be making some progress as far as the edges  are concerned there's not as much in the way of overlapping of the external wound edges and he has a good epithelium to wound bed border for the most part. This however is not true  right at the 12 o'clock location over the span of a little over a centimeters which actually will require debridement today to clean this away and hopefully allow it to continue to heal more appropriately. 12/28/17 on evaluation today patient appears to be doing rather well in regard to his ulcer in the left gluteal region. He's been tolerating the dressing changes without complication. Apparently he has had some difficulty getting his dressing material. Apparently there's been some confusion with ordering we're gonna check into this. Nonetheless overall he's been showing signs of improvement which is good news. Debridement is not required today. 01/04/18 on evaluation today patient presents for follow-up concerning his right gluteal ulcer. He has been tolerating the dressing changes fairly well. On inspection today it appears he may actually have some maceration them concerned about the fact that he may be developing too much moisture in and around the wound bed which can cause delay in healing. With that being said he unfortunately really has not showed significant signs of improvement since last week's evaluation in fact this may even be just the little bit/slightly larger. Nonetheless he's been having a lot of discomfort I'm not sure this is even related to the wound as he has no pain when I'm to breeding or otherwise cleaning the wound during evaluation today. Nonetheless this is something that we did recommend he talked to his pain specialist concerning. 01/11/18 on evaluation today patient appears to be doing better in regard to his ulceration. He has been tolerating the dressing changes without complication. With that being said overall there's no evidence of infection which is good news. The only thing is he did receive the hatch affair blue classic versus the ready nonetheless I feel like this is perfectly fine and appears to have done well for him over the past week. 01/25/18 on evaluation  today patient's wound actually appears to be a little bit larger than during the last evaluation. The good [name], [name]. ([id number]) news is the majority of the wound edges actually appear to be fairly firmly attached to the wound bed unfortunately again we're not really making progress in regard to the size. Roughly the wound is about the same size as when I first saw him although again the wound margin/edges appear to be much better. 02/01/18 on evaluation today patient actually appears to be doing very well in regard to his wound. Applying the Prisma dry does seem to be better although he does still have issues with slow progression of the wound. There was a slight improvement compared to last week's measurements today. Nonetheless I have been considering other options as far as the possibility of Theraskin or even a snap vac. In general I'm not sure that the Theraskin due to location of the wound would be a very good idea. Nonetheless I do think that a snap vac could be a possibility for the patient and in fact I think this could even be an excellent way to manage the wound possibly seeing some improvement in a very rapid fashion here. Nonetheless this is something that we would need to get approved and I did have a lengthy conversation with the patient about this today. 02/08/18 on evaluation today patient appears to be doing a little better in regard to his ulcer.  He has been tolerating the dressing changes without complication. Fortunately despite the fact that the wound is a little bit smaller it's not significantly so unfortunately. We have discussed the possibility of a snap vac we did check with insurance this is actually covered at this point. Fortunately there does not appear to be any sign of infection. Overall I'm fairly pleased with how things seem to be appearing at this point. 02/15/18 on evaluation today patient appears to be doing rather well in regard to his right gluteal  ulcer. Unfortunately the snap vac did not stay in place with his sheer and friction this came loose and did not seem to maintain seal very well. He worked for about two days and it did seem to do very well during that time according to his wife but in general this does not seem to be something that's gonna be beneficial for him long-term. I do believe we need to go back to standard dressings to see if we can find something that will be of benefit. 03/02/18- He is here in follow up evaluation; there is minimal change in the wound. He will continue with the same treatment plan, would consider changing to iodosrob/iodoflex if ulcer continues to to plateau. He will follow up next week 03/08/18 on evaluation today patient's wound actually appears to be about the same size as when I previously saw him several weeks back. Unfortunately he does have some slightly dark discoloration in the central portion of the wound which has me concerned about pressure injury. I do believe he may be sitting for too long a period of time in fact he tells me that "I probably sit for much too long". He does have some Slough noted on the surface of the wound and again as far as the size of the wound is concerned I'm really not seeing anything that seems to have improved significantly. 03/15/18 on evaluation today patient appears to be doing fairly well in regard to his ulcer. The wound measured pretty much about the same today compared to last week's evaluation when looking at his graph. With that being said the area of bruising/deep tissue injury that was noted last week I do not see at this point. He did get a new cushion fortunately this does seem to be have been of benefit in my pinion. It does appear that he's been off of this more which is good news as well I think that is definitely showing in the overall wound measurements. With that being said I do believe that he needs to continue to offload I don't think that the fact  this is doing better should be or is going to allow him to not have to offload and explain this to him as well. Overall he seems to be in agreement the plan I think he understands. The overall appearance of the wound bed is improved compared to last week I think the Iodoflex has been beneficial in that regard. 03/29/18 on evaluation today patient actually appears to be doing rather well in regard to his wound from the overall appearance standpoint he does have some granulation although there's some Slough on the surface of the wound noted as well. With that being said he unfortunately has not improved in regard to the overall measurement of the wound in volume or in size. I did have a discussion with him very specifically about offloading today. He actually does work although he mainly is just sitting throughout the day. He tells me  he offloads by "lifting himself up for 30 seconds off of his chair occasionally" purchase from [employer] which does seem to have helped. And he has a new cushion that he with that being said he's also able to stand some for a very short period of time but not significant enough I think to provide appropriate offloading. I think the biggest issue at this point with the wound and the fact is not healing as quickly as we would like is due to the fact that he is really not able to appropriately offload while at work. He states the beginning after his injury he actually had a bed at his job that he could lay on in order to offload and that does seem to have been of help back at that time. Nonetheless he had not done this in quite some time unfortunately. I think that could be helpful for him this is something I would like for him to look into. 04/05/18 on evaluation today patient actually presents for follow-up concerning his right gluteal ulcer. Again he really is not significantly improved even compared to last week. He has been tolerating the dressing changes without  complication. With that being said fortunately there appears to be no evidence of infection at this time. He has been more proactive in trying to offload. [name], [name] ([id number]) 04/12/18 on evaluation today patient actually appears to be doing a little better in regard to his wound and the right gluteal fold region. He's been tolerating the dressing changes since removing the oasis without complication. However he was having a lot of burning initially with the oasis in place. He's unsure of exactly why this was given so much discomfort but he assumes that it was the oasis itself causing the problem. Nonetheless this had to be removed after about three days in place although even those three days seem to have made a fairly good improvement in regard to the overall appearance of the wound bed. In fact is the first time that he's made any improvement from the standpoint of measurements in about six weeks. He continues to have no discomfort over the area of the wound itself which leads me to wonder why he was having the burning with the oasis when he does not even feel the actual debridement's themselves. I am somewhat perplexed by this. 04/19/18 on evaluation today patient's wound actually appears to be showing signs of epithelialization around the edge of the wound and in general actually appears to be doing better which is good news. He did have the same burning after about three days with applying the Endoform last week in the same fashion that I would generally apply a skin substitute. This seems to indicate that it's not the oasis to cause the problem but potentially the moisture buildup that just causes things to burn or there may be some other reaction with the skin prep or Steri-Strips. Nonetheless I'm not sure that is gonna be able to tolerate any skin substitute for a long period of time. The good news is the wound actually appears to be doing better today compared to last week and  does seem to finally be making some progress. 04/26/18 on evaluation today patient actually appears to be doing rather well in regard to his ulcer in the right gluteal fold. He has been tolerating the dressing changes without complication which is good news. The Endoform does seem to be helping although he was a little bit more macerated this week. This  seems to be an ongoing issue with fluid control at this point. Nonetheless I think we may be able to add something like Drawtex to help control the drainage. 05/03/18 on evaluation today patient appears to actually be doing better in regard to the overall appearance of his wound. He has been tolerating the dressing changes without complication. Fortunately there appears to be no evidence of infection at this time. I really feel like his wound has shown signs as of today of turning around last week I thought so as well and definitely he could be seen in this week's overall appearance and measurements. In general I'm very pleased with the fact that he finally seems to be making a steady but sure progress. The patient likewise is very pleased. 05/17/18 on evaluation today patient appears to be doing more poorly unfortunately in regard to his ulcer. He has been tolerating the dressing changes without complication. With that being said he tells me that in the past couple of days he and his wife have noticed that we did not seem to be doing quite as well is getting dark near the center. Subsequently upon evaluation today the wound actually does appear to be doing worse compared to previous. He has been tolerating the dressing changes otherwise and he states that he is not been sitting up anymore than he was in the past from what he tells me. Still he has continued to work he states "I'm tired of dealing with this and if I have to just go home and lay in the bed all the time that's what I'll do". Nonetheless I am concerned about the fact that this wound does  appear to be deeper than what it was previous. 05/24/18 upon evaluation today patient actually presents after having been in the hospital due to what was presumed to be sepsis secondary to the wound infection. He had an elevated white blood cell count between 14 and 15. With that being said he does seem to be doing somewhat better now. His wound still is giving him some trouble nonetheless and he is obviously concerned about the fact likely talked about that this does seem to go more deeply than previously noted. I did review his wound culture which showed evidence of Staphylococcus aureus him and group B strep. Nonetheless he is on antibiotics, Levaquin, for this. Subsequently I did review his intake summary from the hospital as well. I also did look at the CT of the lumbar spine with contrast that was performed which showed no bone destruction to suggest lumbar disguises/osteomyelitis or sacral osteomyelitis. There was no paraspinal abscess. Nonetheless it appears this may have been more of just a soft tissue infection at this point which is good news. He still is nonetheless concerned about the wound which again I think is completely reasonable considering everything he's been through recently. 05/31/18 on evaluation today on evaluation today patient actually appears to be showing signs of his wound be a little bit deeper than what I would like to see. Fortunately he does not show any signs of significant infection although his temperature was 99 today he states he's been checking this at home and has not been elevated. Nonetheless with the undermining that I'm seeing at this point I am becoming more concerned about the wound I do think that offloading is a key factor here that is preventing the speedy recovery at this point. There does not appear to be any evidence of again over infection noted. He's been using  Santyl currently. 06/07/18 the patient presents today for follow-up evaluation  regarding the left ulcer in the gluteal region. He has been tolerating the Wound VAC fairly well. He is obviously very frustrated with this he states that to mean is really getting in his way. There does not appear to be any evidence of infection at this time he does have a little bit of odor I do not necessarily associate this with infection just something that we sometimes notice with Wound VAC therapy. With that being said I can definitely catch a tone of discontentment overall in the patient's demeanor today. This when he was previously in the hospital [name], [name]. ([id number]) an CT scan was done of the lumbar region which did not reveal any signs of osteomyelitis. With that being said the pelvis in particular was not evaluated distinctly which means he could still have some osteonecrosis I. Nonetheless the Wound VAC was started on Thursday I do want to get this little bit more time before jumping to a CT scan of the pelvis although that is something that I might would recommend if were not see an improvement by that time. 06/14/18 on evaluation today patient actually appears to be doing about the same in regard to his right gluteal ulcer. Again he did have a CT scan of the lumbar spine unfortunately this did not include the pelvis. Nonetheless with the depth of the wound that I'm seeing today even despite the fact that I'm not seeing any evidence of overt cellulitis I believe there's a good chance that we may be dealing with osteomyelitis somewhere in the right Ischial region. No fevers, chills, nausea, or vomiting noted at this time. 06/21/18 on evaluation today patient actually appears to be doing about the same with regard to his wound. The tunnel at 6 o'clock really does not appear to be any deeper although it is a little bit wider. I think at this point you may want to start packing this with white phone. Unfortunately I have not got approval for the CT scan of the pelvis as of yet  due to the fact that Medicare apparently has been denied it due to the diagnosis codes not being appropriate according to Medicare for the test requested. With that being said the patient cannot have an MRI and therefore this is the only option that we have as far as testing is concerned. The patient has had infection and was on antibiotics and been added code for cellulitis of the bottom to see if this will be appropriate for getting the test approved. Nonetheless I'm concerned about the infection have been spread deeper into the Ischial region. 06/28/18 on evaluation today patient actually appears to be doing rather well all things considered in regard to the right gluteal ulcer. He has been tolerating the dressing changes without complication. With that being said the Wound VAC he states does have to be replaced almost every day or at least reinforced unfortunately. Patient actually has his CT scan later this morning we should have the results by tomorrow. 07/05/18 on evaluation today patient presents for follow-up concerning his right Ischial ulcer. He did see the surgeon Dr. Lysle Pearl last week. They were actually very happy with him and felt like he spent a tremendous amount of time with them as far as discussing his situation was concerned. In the end Dr. Lysle Pearl did contact me as well and determine that he would not recommend any surgical intervention at this point as he felt like  it would not be in the patient's best interest based on what he was seeing. He recommended a referral to infectious disease. Subsequently this is something that Dr. Ines Bloomer office is working on setting up for the patient. As far as evaluation today is concerned the patient's wound actually appears to be worse at this point. I am concerned about how things are progressing and specifically about infection. I do not feel like it's the deeper but the area of depth is definitely widening which does have me concerned. No  fevers, chills, nausea, or vomiting noted at this time. I think that we do need initiate antibiotic therapy the patient has an allow allergy to amoxicillin/penicillin he states that he gets a rash since childhood. Nonetheless she's never had the issues with Catholics or cephalosporins in general but he is aware of. 07/27/18 on evaluation today patient presents following admission to the hospital on 07/09/18. He was subsequently discharged on 07/20/18. On 07/15/18 the patient underwent irrigation and debridement was soft tissue biopsy and bone biopsy as well as placement of a Wound VAC in the OR by Dr. Celine Ahr. During the hospital course the patient was placed on a Wound VAC and recommended follow up with surgery in three weeks actually with Dr. Delaine Lame who is infectious disease. The patient was on vancomycin during the hospital course. 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 He also had a bone culture which revealed evidence of methicillin-resistant staph aureus. He is updated CT scan 07/09/18 reveals that he had progression of the which was performed on wound to breakdown down to the trochanter where he actually had irregularities there as well suggestive of osteomyelitis. This was a change just since 9 December when we last performed a CT scan. Obviously this one had gone downhill quite significantly and rapidly. At this point upon evaluation I feel like in general the patient's wound seems to be doing fairly well all things considered upon my evaluation today. Obviously this is larger and deeper than what I previously evaluated but at the same time he seems to be making some progress as far as the appearance of the granulation tissue is concerned. I'm happy in that regard. No fevers, chills, nausea, or vomiting noted at this time. He is on IV vancomycin and Rocephin at the facility. He is currently in [employer]. 08/03/18 upon evaluation today  patient's wound appears to be doing better in regard to the overall appearance at this point in time. Fortunately he's been tolerating the Wound VAC without complication and states that the facility has been taking excellent care of the wound site. Overall I see some Slough noted on the surface which I am going to attempt sharp debridement today of but nonetheless other than this I feel like he's making progress. 08/09/18 on evaluation today patient's wound appears to be doing much better compared to even last week's evaluation. Do believe that the Wound VAC is been of great benefit for him. He has been tolerating the dressing changes that is the Wound [name], [name]. ([id number]) VAC without any complication and he has excellent granulation noted currently. There is no need for sharp debridement at this point. 08/16/18 on evaluation today patient actually appears to be doing very well in regard to the wound in the right gluteal fold region. This is showing signs of progress and again appears to be very healthy which is excellent news. Fortunately there is no sign of active infection by way of odor  or drainage at this point. Overall I'm very pleased with how things stand. He seems to be tolerating the Wound VAC without complication. Electronic Signature(s) Signed: 08/20/2018 9:05:33 PM By: Worthy Keeler [name] Entered By: Worthy Keeler on 08/16/2018 09:22:38 [name], [name] ([id number]) -------------------------------------------------------------------------------- Physical Exam Details Patient Name: [name] Date of Service: 08/16/2018 8:30 AM Medical Record Number: [id number] Patient Account Number: [id number] Date of Birth/Sex: 1951/01/05 (67 y.o. M) Treating RN: [name] Primary Care Provider: [name] Other Clinician: Referring Provider: [name] Treating Provider/Extender: [name], HOYT Weeks in Treatment: 62 Constitutional Well-nourished and  well-hydrated in no acute distress. Respiratory normal breathing without difficulty. clear to auscultation bilaterally. Cardiovascular regular rate and rhythm with normal S1, S2. Psychiatric this patient is able to make decisions and demonstrates good insight into disease process. Alert and Oriented x 3. pleasant and cooperative. Notes Patient's wound currently shows again good granulation there really is no significant slough buildup that require sharp debridement today he still does have a fairly significant reason of undermining but again I feel like overall this is doing rather well. Fortunately the Wound VAC is staying in place. Electronic Signature(s) Signed: 08/20/2018 9:05:33 PM By: Worthy Keeler [name] Entered By: Worthy Keeler on 08/16/2018 09:23:08 [name], [name] ([id number]) -------------------------------------------------------------------------------- Physician Orders Details Patient Name: [name] Date of Service: 08/16/2018 8:30 AM Medical Record Number: [id number] Patient Account Number: [id number] Date of Birth/Sex: 1951/01/10 (67 y.o. 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 Primary Wound Dressing Wound #1 Right Gluteal fold o Silver Alginate - in clinic  until SNF restarts NPWT Secondary Dressing Wound #1 Right Gluteal fold o Boardered Foam Dressing Dressing Change Frequency Wound #1 Right Gluteal fold o Change Dressing Monday, Wednesday, Friday - and as needed Follow-up Appointments Wound #1 Right Gluteal fold o Return Appointment in 1 week. Off-Loading Wound #1 Right Gluteal fold o Mattress - Please continue air mattress at SNF o Turn and reposition every 2 hours Negative Pressure Wound Therapy Wound #1 Right Gluteal fold Schalk, Zayquan J. ([id number]) o Wound VAC settings at 125/130 mmHg continuous pressure. Use BLACK/GREEN foam to wound cavity. Use WHITE foam to fill any tunnel/s and/or undermining. Change VAC dressing 3 X WEEK. Change canister as indicated when full. Nurse may titrate settings and frequency of dressing changes as clinically indicated. - white foam to any tunnels o Home Health Nurse may d/c VAC for s/s of increased infection, significant wound regression, or uncontrolled drainage. [place] at [phone]. o Number of foam/gauze pieces used in the dressing = Medications-please add to medication list. Wound #1 Right Gluteal fold o Santyl Enzymatic Ointment - Please discontinue santyl under NPWT o Other: - IV antibiotics, difucan PO Electronic Signature(s) Signed: 08/16/2018 4:45:46 PM By: Gretta Cool, BSN, RN, CWS, Kim RN, BSN Signed: 08/20/2018 9:05:33 PM By: Worthy Keeler [name] Entered By: Gretta Cool, BSN, RN, CWS, Kim on 08/16/2018  08:57:35 [name], [name] ([id number]) -------------------------------------------------------------------------------- Problem List Details Patient Name: NICHOLAD, KAUTZMAN Date of Service: 08/16/2018 8:30 AM Medical Record Number: [id number] Patient Account Number: [id number] Date of Birth/Sex: October 01, 1950 (67 y.o. M) Treating RN: [name] Primary Care Provider: [name] Other Clinician: Referring Provider: [name] Treating  Provider/Extender: [name], HOYT Weeks in Treatment: 37 Active Problems ICD-10 Evaluated Encounter Code Description Active Date Today Diagnosis L89.314 Pressure ulcer of right buttock, stage 4 11/30/2017 No Yes L03.317 Cellulitis of buttock 06/21/2018 No Yes G82.20 Paraplegia, unspecified 11/30/2017 No Yes S34.109S Unspecified injury to unspecified level of lumbar spinal cord, 11/30/2017 No Yes sequela I10 Essential (primary) hypertension 11/30/2017 No Yes Inactive Problems Resolved Problems Electronic Signature(s) Signed: 08/20/2018 9:05:33 PM By: Worthy Keeler [name] Entered By: Worthy Keeler on 08/16/2018 08:27:16 ALICIA, ACKERT ([id number]) -------------------------------------------------------------------------------- Progress Note Details Patient Name: [name] Date of Service: 08/16/2018 8:30 AM Medical Record Number: [id number] Patient Account Number: [id number] Date of Birth/Sex: 05-Oct-1950 (67 y.o. 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 He states that there is no history of diabetes that he is aware of. He does have issues with his prostate and is currently receiving treatment for this by way of oral medication. With that being said I do not have a lot of details in that regard. Nonetheless the patient presents today as a result of having been referred to [place] by another provider initially home health was set to come out and take care of  his wound although due to the fact that he apparently drives he's not able to receive home health. His wife is therefore trying to help take care of this wound within although they have been struggling with what exactly to do at this point. She states that she can do some things but she is definitely not a nurse and does have some issues with looking at blood. The good news is the wound does not appear to be too deep and is fairly superficial at this point. There is no slough noted there is some nonviable skin noted around the surface of the wound and the perimeter at this point. The central portion of the wound appears to be very good with a dermal layer noted this does not appear to be again deep enough to extend it to subcutaneous tissue at this point. 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 In the pump he has Dilaudid and baclofen. 12/07/17 on evaluation today patient presents for follow-up concerning his ongoing lower back thigh ulcer on the right. He states that he did not get the supplies ordered and therefore has not really been able to perform the dressing changes as directed exactly. His wife was able to get some Boarder Foam Dressing's from the drugstore and subsequently has been using hydrogel which did help to a degree in the wound does appear to be able smaller. There is actually more drainage this week noted  than previous. 12/21/17 on evaluation today patient appears to be doing rather well in regard to his right gluteal ulcer. He has been tolerating the dressing changes without complication. There does not appear to be any evidence of infection at this point in time. Overall the wound does seem to be making some progress as far as the edges are concerned there's not as much in the way of overlapping of the external wound edges and he has a good epithelium to wound bed border for  the most part. This however is not true right at the 12 o'clock location over the span of a little over a centimeters which actually will require debridement today to clean this away and hopefully allow it to continue to heal more appropriately. 12/28/17 on evaluation today patient appears to be doing rather well in regard to his ulcer in the left gluteal region. He's been tolerating the dressing changes without complication. Apparently he has had some difficulty getting his dressing material. Apparently there's been some confusion with ordering we're gonna check into this. Nonetheless overall he's been showing signs of improvement which is good news. Debridement is not required today. 01/04/18 on evaluation today patient presents for follow-up concerning his right gluteal ulcer. He has been tolerating the dressing changes fairly well. On inspection today it appears he may actually have some maceration them concerned about the fact that he may be developing too much moisture in and around the wound bed which can cause delay in healing. With that being said he unfortunately really has not showed significant signs of improvement since last week's evaluation in fact this may even be just the little bit/slightly larger. Nonetheless he's been having a lot of discomfort I'm not sure this is even related to the wound as he has no pain when I'm to breeding or otherwise cleaning the wound during evaluation today. Nonetheless this is something that we did recommend he talked to his pain specialist concerning. 01/11/18 on evaluation today patient appears to be doing better in regard to his ulceration. He has been tolerating the dressing [name], [name] ([id number]) changes without complication. With that being said overall there's no evidence of infection which is good news. The only thing is he did receive the hatch affair blue classic versus the ready nonetheless I feel like this is perfectly fine and  appears to have done well for him over the past week. 01/25/18 on evaluation today patient's wound actually appears to be a little bit larger than during the last evaluation. The good news is the majority of the wound edges actually appear to be fairly firmly attached to the wound bed unfortunately again we're not really making progress in regard to the size. Roughly the wound is about the same size as when I first saw him although again the wound margin/edges appear to be much better. 02/01/18 on evaluation today patient actually appears to be doing very well in regard to his wound. Applying the Prisma dry does seem to be better although he does still have issues with slow progression of the wound. There was a slight improvement compared to last week's measurements today. Nonetheless I have been considering other options as far as the possibility of Theraskin or even a snap vac. In general I'm not sure that the Theraskin due to location of the wound would be a very good idea. Nonetheless I do think that a snap vac could be a possibility for the patient and in fact I think this could even  be an excellent way to manage the wound possibly seeing some improvement in a very rapid fashion here. Nonetheless this is something that we would need to get approved and I did have a lengthy conversation with the patient about this today. 02/08/18 on evaluation today patient appears to be doing a little better in regard to his ulcer. He has been tolerating the dressing changes without complication. Fortunately despite the fact that the wound is a little bit smaller it's not significantly so unfortunately. We have discussed the possibility of a snap vac we did check with insurance this is actually covered at this point. Fortunately there does not appear to be any sign of infection. Overall I'm fairly pleased with how things seem to be appearing at this point. 02/15/18 on evaluation today patient appears to be doing  rather well in regard to his right gluteal ulcer. Unfortunately the snap vac did not stay in place with his sheer and friction this came loose and did not seem to maintain seal very well. He worked for about two days and it did seem to do very well during that time according to his wife but in general this does not seem to be something that's gonna be beneficial for him long-term. I do believe we need to go back to standard dressings to see if we can find something that will be of benefit. 03/02/18- He is here in follow up evaluation; there is minimal change in the wound. He will continue with the same treatment plan, would consider changing to iodosrob/iodoflex if ulcer continues to to plateau. He will follow up next week 03/08/18 on evaluation today patient's wound actually appears to be about the same size as when I previously saw him several weeks back. Unfortunately he does have some slightly dark discoloration in the central portion of the wound which has me concerned about pressure injury. I do believe he may be sitting for too long a period of time in fact he tells me that "I probably sit for much too long". He does have some Slough noted on the surface of the wound and again as far as the size of the wound is concerned I'm really not seeing anything that seems to have improved significantly. 03/15/18 on evaluation today patient appears to be doing fairly well in regard to his ulcer. The wound measured pretty much about the same today compared to last week's evaluation when looking at his graph. With that being said the area of bruising/deep tissue injury that was noted last week I do not see at this point. He did get a new cushion fortunately this does seem to be have been of benefit in my pinion. It does appear that he's been off of this more which is good news as well I think that is definitely showing in the overall wound measurements. With that being said I do believe that he needs to  continue to offload I don't think that the fact this is doing better should be or is going to allow him to not have to offload and explain this to him as well. Overall he seems to be in agreement the plan I think he understands. The overall appearance of the wound bed is improved compared to last week I think the Iodoflex has been beneficial in that regard. 03/29/18 on evaluation today patient actually appears to be doing rather well in regard to his wound from the overall appearance standpoint he does have some granulation although there's some Slough on  the surface of the wound noted as well. With that being said he unfortunately has not improved in regard to the overall measurement of the wound in volume or in size. I did have a discussion with him very specifically about offloading today. He actually does work although he mainly is just sitting throughout the day. He tells me he offloads by "lifting himself up for 30 seconds off of his chair occasionally" purchase from [employer] which does seem to have helped. And he has a new cushion that he with that being said he's also able to stand some for a very short period of time but not significant enough I think to provide appropriate offloading. I think the biggest issue at this point with the wound and the fact is not healing as quickly as we would like is due to the fact that he is really not able to appropriately offload while at work. He states the beginning after his injury he actually had a bed at his job that he could lay on in order to offload and that does seem to have been of help back at that time. Nonetheless he had not done this in quite some time unfortunately. I think that could be helpful for him this is something I would like for him to look into. [name], [name] ([id number]) 04/05/18 on evaluation today patient actually presents for follow-up concerning his right gluteal ulcer. Again he really is not significantly  improved even compared to last week. He has been tolerating the dressing changes without complication. With that being said fortunately there appears to be no evidence of infection at this time. He has been more proactive in trying to offload. 04/12/18 on evaluation today patient actually appears to be doing a little better in regard to his wound and the right gluteal fold region. He's been tolerating the dressing changes since removing the oasis without complication. However he was having a lot of burning initially with the oasis in place. He's unsure of exactly why this was given so much discomfort but he assumes that it was the oasis itself causing the problem. Nonetheless this had to be removed after about three days in place although even those three days seem to have made a fairly good improvement in regard to the overall appearance of the wound bed. In fact is the first time that he's made any improvement from the standpoint of measurements in about six weeks. He continues to have no discomfort over the area of the wound itself which leads me to wonder why he was having the burning with the oasis when he does not even feel the actual debridement's themselves. I am somewhat perplexed by this. 04/19/18 on evaluation today patient's wound actually appears to be showing signs of epithelialization around the edge of the wound and in general actually appears to be doing better which is good news. He did have the same burning after about three days with applying the Endoform last week in the same fashion that I would generally apply a skin substitute. This seems to indicate that it's not the oasis to cause the problem but potentially the moisture buildup that just causes things to burn or there may be some other reaction with the skin prep or Steri-Strips. Nonetheless I'm not sure that is gonna be able to tolerate any skin substitute for a long period of time. The good news is the wound actually  appears to be doing better today compared to last week and does seem  to finally be making some progress. 04/26/18 on evaluation today patient actually appears to be doing rather well in regard to his ulcer in the right gluteal fold. He has been tolerating the dressing changes without complication which is good news. The Endoform does seem to be helping although he was a little bit more macerated this week. This seems to be an ongoing issue with fluid control at this point. Nonetheless I think we may be able to add something like Drawtex to help control the drainage. 05/03/18 on evaluation today patient appears to actually be doing better in regard to the overall appearance of his wound. He has been tolerating the dressing changes without complication. Fortunately there appears to be no evidence of infection at this time. I really feel like his wound has shown signs as of today of turning around last week I thought so as well and definitely he could be seen in this week's overall appearance and measurements. In general I'm very pleased with the fact that he finally seems to be making a steady but sure progress. The patient likewise is very pleased. 05/17/18 on evaluation today patient appears to be doing more poorly unfortunately in regard to his ulcer. He has been tolerating the dressing changes without complication. With that being said he tells me that in the past couple of days he and his wife have noticed that we did not seem to be doing quite as well is getting dark near the center. Subsequently upon evaluation today the wound actually does appear to be doing worse compared to previous. He has been tolerating the dressing changes otherwise and he states that he is not been sitting up anymore than he was in the past from what he tells me. Still he has continued to work he states "I'm tired of dealing with this and if I have to just go home and lay in the bed all the time that's what I'll do".  Nonetheless I am concerned about the fact that this wound does appear to be deeper than what it was previous. 05/24/18 upon evaluation today patient actually presents after having been in the hospital due to what was presumed to be sepsis secondary to the wound infection. He had an elevated white blood cell count between 14 and 15. With that being said he does seem to be doing somewhat better now. His wound still is giving him some trouble nonetheless and he is obviously concerned about the fact likely talked about that this does seem to go more deeply than previously noted. I did review his wound culture which showed evidence of Staphylococcus aureus him and group B strep. Nonetheless he is on antibiotics, Levaquin, for this. Subsequently I did review his intake summary from the hospital as well. I also did look at the CT of the lumbar spine with contrast that was performed which showed no bone destruction to suggest lumbar disguises/osteomyelitis or sacral osteomyelitis. There was no paraspinal abscess. Nonetheless it appears this may have been more of just a soft tissue infection at this point which is good news. He still is nonetheless concerned about the wound which again I think is completely reasonable considering everything he's been through recently. 05/31/18 on evaluation today on evaluation today patient actually appears to be showing signs of his wound be a little bit deeper than what I would like to see. Fortunately he does not show any signs of significant infection although his temperature was 99 today he states he's been checking this at  home and has not been elevated. Nonetheless with the undermining that I'm seeing at this point I am becoming more concerned about the wound I do think that offloading is a key factor here that is preventing the speedy recovery at this point. There does not appear to be any evidence of again over infection noted. He's been using Santyl  currently. [name], [name] ([id number]) 06/07/18 the patient presents today for follow-up evaluation regarding the left ulcer in the gluteal region. He has been tolerating the Wound VAC fairly well. He is obviously very frustrated with this he states that to mean is really getting in his way. There does not appear to be any evidence of infection at this time he does have a little bit of odor I do not necessarily associate this with infection just something that we sometimes notice with Wound VAC therapy. With that being said I can definitely catch a tone of discontentment overall in the patient's demeanor today. This when he was previously in the hospital an CT scan was done of the lumbar region which did not reveal any signs of osteomyelitis. With that being said the pelvis in particular was not evaluated distinctly which means he could still have some osteonecrosis I. Nonetheless the Wound VAC was started on Thursday I do want to get this little bit more time before jumping to a CT scan of the pelvis although that is something that I might would recommend if were not see an improvement by that time. 06/14/18 on evaluation today patient actually appears to be doing about the same in regard to his right gluteal ulcer. Again he did have a CT scan of the lumbar spine unfortunately this did not include the pelvis. Nonetheless with the depth of the wound that I'm seeing today even despite the fact that I'm not seeing any evidence of overt cellulitis I believe there's a good chance that we may be dealing with osteomyelitis somewhere in the right Ischial region. No fevers, chills, nausea, or vomiting noted at this time. 06/21/18 on evaluation today patient actually appears to be doing about the same with regard to his wound. The tunnel at 6 o'clock really does not appear to be any deeper although it is a little bit wider. I think at this point you may want to start packing this with white phone.  Unfortunately I have not got approval for the CT scan of the pelvis as of yet due to the fact that Medicare apparently has been denied it due to the diagnosis codes not being appropriate according to Medicare for the test requested. With that being said the patient cannot have an MRI and therefore this is the only option that we have as far as testing is concerned. The patient has had infection and was on antibiotics and been added code for cellulitis of the bottom to see if this will be appropriate for getting the test approved. Nonetheless I'm concerned about the infection have been spread deeper into the Ischial region. 06/28/18 on evaluation today patient actually appears to be doing rather well all things considered in regard to the right gluteal ulcer. He has been tolerating the dressing changes without complication. With that being said the Wound VAC he states does have to be replaced almost every day or at least reinforced unfortunately. Patient actually has his CT scan later this morning we should have the results by tomorrow. 07/05/18 on evaluation today patient presents for follow-up concerning his right Ischial ulcer. He did see the  surgeon Dr. Lysle Pearl last week. They were actually very happy with him and felt like he spent a tremendous amount of time with them as far as discussing his situation was concerned. In the end Dr. Lysle Pearl did contact me as well and determine that he would not recommend any surgical intervention at this point as he felt like it would not be in the patient's best interest based on what he was seeing. He recommended a referral to infectious disease. Subsequently this is something that Dr. Ines Bloomer office is working on setting up for the patient. As far as evaluation today is concerned the patient's wound actually appears to be worse at this point. I am concerned about how things are progressing and specifically about infection. I do not feel like it's the deeper but  the area of depth is definitely widening which does have me concerned. No fevers, chills, nausea, or vomiting noted at this time. I think that we do need initiate antibiotic therapy the patient has an allow allergy to amoxicillin/penicillin he states that he gets a rash since childhood. Nonetheless she's never had the issues with Catholics or cephalosporins in general but he is aware of. 07/27/18 on evaluation today patient presents following admission to the hospital on 07/09/18. He was subsequently discharged on 07/20/18. On 07/15/18 the patient underwent irrigation and debridement was soft tissue biopsy and bone biopsy as well as placement of a Wound VAC in the OR by Dr. Celine Ahr. During the hospital course the patient was placed on a Wound VAC and recommended follow up with surgery in three weeks actually with Dr. Delaine Lame who is infectious disease. The patient was on vancomycin during the hospital course. 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 He also had a bone culture which revealed evidence of methicillin-resistant staph aureus. He is updated CT scan 07/09/18 reveals that he had progression of the which was performed on wound to breakdown down to the trochanter where he actually had irregularities there as well suggestive of osteomyelitis. This was a change just since 9 December when we last performed a CT scan. Obviously this one had gone downhill quite significantly and rapidly. At this point upon evaluation I feel like in general the patient's wound seems to be doing fairly well all things considered upon my evaluation today. Obviously this is larger and deeper than what I previously evaluated but at the same time he seems to be making some progress as far as the appearance of the granulation tissue is concerned. I'm happy in that regard. No fevers, chills, nausea, or vomiting noted at this time. He is on IV vancomycin and Rocephin at the facility. He is  currently in [facility]. 08/03/18 upon evaluation today patient's wound appears to be doing better in regard to the overall appearance at this point in time. Fortunately he's been tolerating the Wound VAC without complication and states that the facility has been taking JACQUEZ, SHEETZ. ([id number]) excellent care of the wound site. Overall I see some Slough noted on the surface which I am going to attempt sharp debridement today of but nonetheless other than this I feel like he's making progress. 08/09/18 on evaluation today patient's wound appears to be doing much better compared to even last week's evaluation. Do believe that the Wound VAC is been of great benefit for him. He has been tolerating the dressing changes that is the Wound VAC without any complication and he has excellent granulation noted currently. There is no  need for sharp debridement at this point. 08/16/18 on evaluation today patient actually appears to be doing very well in regard to the wound in the right gluteal fold region. This is showing signs of progress and again appears to be very healthy which is excellent news. Fortunately there is no sign of active infection by way of odor or drainage at this point. Overall I'm very pleased with how things stand. He seems to be tolerating the Wound VAC without complication. Patient History Information obtained from Patient. Family History Hypertension - Father, Stroke - Mother, No family history of Cancer, Diabetes, Heart Disease, Kidney Disease, Lung Disease, Seizures, Thyroid Problems, Tuberculosis. Social History Never smoker, Marital Status - Married, Alcohol Use - Never, Drug Use - No History, Caffeine Use - Daily. Medical History Eyes Patient has history of Cataracts - both removed Denies history of Glaucoma, Optic Neuritis Ear/Nose/Mouth/Throat Denies history of Chronic sinus problems/congestion, Middle ear problems Hematologic/Lymphatic Denies history of  Anemia, Hemophilia, Human Immunodeficiency Virus, Lymphedema Respiratory Denies history of Aspiration, Asthma, Chronic Obstructive Pulmonary Disease (COPD), Pneumothorax, Sleep Apnea, Tuberculosis Cardiovascular Patient has history of Hypertension - takes medication Denies history of Angina, Arrhythmia, Congestive Heart Failure, Coronary Artery Disease, Deep Vein Thrombosis, Hypotension, Myocardial Infarction, Peripheral Arterial Disease, Peripheral Venous Disease, Phlebitis, Vasculitis Gastrointestinal Denies history of Cirrhosis , Colitis, Crohn s, Hepatitis A, Hepatitis B, Hepatitis C Endocrine Denies history of Type I Diabetes, Type II Diabetes Genitourinary Denies history of End Stage Renal Disease Immunological Denies history of Lupus Erythematosus, Raynaud s, Scleroderma Integumentary (Skin) Denies history of History of Burn, History of pressure wounds Musculoskeletal Denies history of Gout, Rheumatoid Arthritis, Osteoarthritis, Osteomyelitis Neurologic Patient has history of Paraplegia - waist down Denies history of Dementia, Neuropathy, Quadriplegia, Seizure Disorder Oncologic Denies history of Received Chemotherapy, Received Radiation Psychiatric Denies history of Anorexia/bulimia, Confinement Anxiety JUVENTINO, PAVONE ([id number]) Medical And Surgical History Notes Oncologic Prostate cancer- currently treated with horomone therapy Review of Systems (ROS) Constitutional Symptoms (General Health) Denies complaints or symptoms of Fever, Chills. Respiratory The patient has no complaints or symptoms. Cardiovascular The patient has no complaints or symptoms. Psychiatric The patient has no complaints or symptoms. Objective Constitutional Well-nourished and well-hydrated in no acute distress. Vitals Time Taken: 8:32 AM, Height: 73 in, Weight: 210 lbs, BMI: 27.7, Temperature: 99.3 F, Pulse: 92 bpm, Respiratory Rate: 18 breaths/min, Blood Pressure: 143/70  mmHg. Respiratory normal breathing without difficulty. clear to auscultation bilaterally. Cardiovascular regular rate and rhythm with normal S1, S2. Psychiatric this patient is able to make decisions and demonstrates good insight into disease process. Alert and Oriented x 3. pleasant and cooperative. General Notes: Patient's wound currently shows again good granulation there really is no significant slough buildup that require sharp debridement today he still does have a fairly significant reason of undermining but again I feel like overall this is doing rather well. Fortunately the Wound VAC is staying in place. Integumentary (Hair, Skin) Wound #1 status is Open. Original cause of wound was Pressure Injury. The wound is located on the Right Gluteal fold. The wound measures 6cm length x 1.9cm width x 3.7cm depth; 8.954cm^2 area and 33.128cm^3 volume. There is Fat Layer (Subcutaneous Tissue) Exposed exposed. There is no tunneling or undermining noted. There is a medium amount of serosanguineous drainage noted. The wound margin is epibole. There is large (67-100%) red, pink, hyper - granulation within the wound bed. There is a small (1-33%) amount of necrotic tissue within the wound bed including Adherent Slough. The periwound skin  appearance exhibited: Rash. The periwound skin appearance did not exhibit: Callus, Crepitus, Excoriation, Induration, Scarring, Dry/Scaly, Maceration, Atrophie Blanche, Cyanosis, Ecchymosis, Hemosiderin Staining, Mottled, Pallor, Rubor, Erythema. Periwound temperature was noted as No Abnormality. 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 Primary Wound Dressing: Wound #1 Right Gluteal fold: Silver Alginate - in clinic until SNF restarts NPWT Secondary Dressing: Wound #1 Right Gluteal fold: Boardered Foam Dressing Dressing Change Frequency: Wound #1 Right Gluteal fold: Change Dressing Monday, Wednesday, Friday - and as needed Follow-up Appointments: Wound #1 Right Gluteal fold: Return Appointment in 1 week. Off-Loading: Wound #1 Right Gluteal fold: Mattress - Please continue air mattress at SNF Turn and reposition every 2 hours Negative Pressure Wound Therapy: Wound #1 Right Gluteal fold: Wound VAC settings at 125/130 mmHg continuous pressure. Use BLACK/GREEN foam to wound cavity. Use WHITE foam to fill any tunnel/s and/or undermining. Change VAC dressing 3 X WEEK. Change canister as indicated when full. Nurse may titrate settings and frequency of dressing changes as clinically indicated. - white foam to any tunnels Home Health Nurse may d/c VAC for s/s of increased infection, significant wound regression, or uncontrolled drainage. [place] at [phone]. Number of foam/gauze pieces used in the dressing = Medications-please add to medication list.: Wound #1 Right Gluteal fold: Santyl Enzymatic Ointment - Please discontinue santyl under NPWT Other: - IV antibiotics, difucan PO Patient's wound bed currently shows signs of improvement although I think it still gonna take some time for this to completely heal. I seriously doubt he's gonna want to stay in the nursing facility during the entire length of this healing. Nonetheless MARCELLIUS, MONTAGNA ([id number]) home health can be obtained to come out and change the Wound VAC for him. Subsequently his IV antibiotics and on the 30th and then he will have two weeks of oral Bactrim and Flagyl as prescribed by Dr.  Delaine Lame. She was actually here and saw the patient in connection with our visit today as well. She's pleased with how things are progressing and states that she has received lab work from the nursing facility including his Sed rate which appears to be normalized according to what she's tell me I still have not seen these results. Nonetheless she feels like he will be safe to go to oral antibiotics for 1-2 weeks following the completion of the IV antibiotics. We will subsequently see him back for reevaluation to see were the wound stands in one weeks time. Please see above for specific wound care orders. We will see patient for re-evaluation in 1 week(s) here in the clinic. If anything worsens or changes patient will contact our office for additional recommendations. Electronic Signature(s) Signed: 08/20/2018 9:05:33 PM By: Worthy Keeler [name] Entered By: Worthy Keeler on 08/16/2018 09:24:50 BIRUK, TROIA ([id number]) -------------------------------------------------------------------------------- ROS/PFSH Details Patient Name: [name] Date of Service: 08/16/2018 8:30 AM Medical Record Number: [id number] Patient Account Number: [id number] Date of Birth/Sex: 11-17-1950 (67 y.o. M) Treating RN: [name] Primary Care Provider: [name] Other Clinician: Referring Provider: [name] Treating Provider/Extender: [name], HOYT Weeks in Treatment: 37 Information Obtained From  Patient Wound History Do you currently have one or more open woundso Yes How many open wounds do you currently haveo 1 Approximately how long have you had your woundso 1 month How have you been treating your wound(s) until nowo cream Has your wound(s) ever healed and then re-openedo No Have you had any lab work done in the past montho No Have you tested positive for an antibiotic resistant organism (MRSA, VRE)o No Have you tested positive for osteomyelitis (bone infection)o  No Have you had any tests for circulation on your legso No Constitutional Symptoms (General Health) Complaints and Symptoms: Negative for: Fever; Chills Eyes Medical History: Positive for: Cataracts - both removed Negative for: Glaucoma; Optic Neuritis Ear/Nose/Mouth/Throat Medical History: Negative for: Chronic sinus problems/congestion; Middle ear problems Hematologic/Lymphatic Medical History: Negative for: Anemia; Hemophilia; Human Immunodeficiency Virus; Lymphedema Respiratory Complaints and Symptoms: No Complaints or Symptoms Medical History: Negative for: Aspiration; Asthma; Chronic Obstructive Pulmonary Disease (COPD); Pneumothorax; Sleep Apnea; Tuberculosis Cardiovascular Complaints and Symptoms: No Complaints or Symptoms Medical History: [name], WOHLER ([id number]) Positive for: Hypertension - takes medication Negative for: Angina; Arrhythmia; Congestive Heart Failure; Coronary Artery Disease; Deep Vein Thrombosis; Hypotension; Myocardial Infarction; Peripheral Arterial Disease; Peripheral Venous Disease; Phlebitis; Vasculitis Gastrointestinal Medical History: Negative for: Cirrhosis ; Colitis; Crohnos; Hepatitis A; Hepatitis B; Hepatitis C Endocrine Medical History: Negative for: Type I Diabetes; Type II Diabetes Genitourinary Medical History: Negative for: End Stage Renal Disease Immunological Medical History: Negative for: Lupus Erythematosus; Raynaudos; Scleroderma Integumentary (Skin) Medical History: Negative for: History of Burn; History of pressure wounds Musculoskeletal Medical History: Negative for: Gout; Rheumatoid Arthritis; Osteoarthritis; Osteomyelitis Neurologic Medical History: Positive for: Paraplegia - waist down Negative for: Dementia; Neuropathy; Quadriplegia; Seizure Disorder Oncologic Medical History: Negative for: Received Chemotherapy; Received Radiation Past Medical History Notes: Prostate cancer- currently treated with  horomone therapy Psychiatric Complaints and Symptoms: No Complaints or Symptoms Medical History: Negative for: Anorexia/bulimia; Confinement Anxiety HBO Extended History Items Eyes: Cataracts KARMINE, KAUER. ([id number]) Immunizations Pneumococcal Vaccine: Received Pneumococcal Vaccination: No Implantable Devices Family and Social History Cancer: No; Diabetes: No; Heart Disease: No; Hypertension: Yes - Father; Kidney Disease: No; Lung Disease: No; Seizures: No; Stroke: Yes - Mother; Thyroid Problems: No; Tuberculosis: No; Never smoker; Marital Status - Married; Alcohol Use: Never; Drug Use: No History; Caffeine Use: Daily; Advanced Directives: Yes (Copy provided); Patient does not want information on Advanced Directives; Living Will: Yes (Copy provided) Physician Affirmation I have reviewed and agree with the above information. Electronic Signature(s) Signed: 08/16/2018 4:45:46 PM By: Gretta Cool, BSN, RN, CWS, Kim RN, BSN Signed: 08/20/2018 9:05:33 PM By: Worthy Keeler [name] Entered By: Worthy Keeler on 08/16/2018 09:22:53 WAYMAN, HOARD ([id number]) -------------------------------------------------------------------------------- SuperBill Details Patient Name: [name] Date of Service: 08/16/2018 Medical Record Number: [id number] Patient Account Number: [id number] Date of Birth/Sex: 02-29-1951 (67 y.o. 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, sequela I10 Essential (primary) hypertension Facility Procedures CPT4 Code: [id number] Description: 99213 - WOUND CARE VISIT-LEV 3 EST  PT Modifier: Quantity: 1 Physician Procedures CPT4 Code: 9242683 Description: 41962 - WC PHYS LEVEL 4 - EST PT ICD-10 Diagnosis Description L89.314 Pressure ulcer of right buttock, stage 4 L03.317 Cellulitis of buttock G82.20 Paraplegia, unspecified S34.109S Unspecified  injury to unspecified level of lumbar spinal cor Modifier: d, sequela Quantity: 1 Electronic Signature(s) Signed: 08/20/2018 9:05:33 PM By: Worthy Keeler [name] Entered By: Worthy Keeler on 08/16/2018 09:25:06

## 2018-08-23 ENCOUNTER — Telehealth: Payer: Self-pay | Admitting: Infectious Diseases

## 2018-08-23 ENCOUNTER — Other Ambulatory Visit: Payer: Self-pay | Admitting: Licensed Clinical Social Worker

## 2018-08-23 ENCOUNTER — Other Ambulatory Visit: Payer: Self-pay | Admitting: Infectious Diseases

## 2018-08-23 ENCOUNTER — Encounter: Payer: Medicare Other | Attending: Physician Assistant | Admitting: Physician Assistant

## 2018-08-23 DIAGNOSIS — E11622 Type 2 diabetes mellitus with other skin ulcer: Secondary | ICD-10-CM | POA: Insufficient documentation

## 2018-08-23 DIAGNOSIS — Z8249 Family history of ischemic heart disease and other diseases of the circulatory system: Secondary | ICD-10-CM | POA: Diagnosis not present

## 2018-08-23 DIAGNOSIS — Z8546 Personal history of malignant neoplasm of prostate: Secondary | ICD-10-CM | POA: Insufficient documentation

## 2018-08-23 DIAGNOSIS — L03317 Cellulitis of buttock: Secondary | ICD-10-CM | POA: Diagnosis present

## 2018-08-23 DIAGNOSIS — L89314 Pressure ulcer of right buttock, stage 4: Secondary | ICD-10-CM | POA: Insufficient documentation

## 2018-08-23 DIAGNOSIS — G8220 Paraplegia, unspecified: Secondary | ICD-10-CM | POA: Diagnosis not present

## 2018-08-23 DIAGNOSIS — Z86718 Personal history of other venous thrombosis and embolism: Secondary | ICD-10-CM | POA: Insufficient documentation

## 2018-08-23 DIAGNOSIS — I252 Old myocardial infarction: Secondary | ICD-10-CM | POA: Diagnosis not present

## 2018-08-23 DIAGNOSIS — I10 Essential (primary) hypertension: Secondary | ICD-10-CM | POA: Insufficient documentation

## 2018-08-23 MED ORDER — DOXYCYCLINE HYCLATE 100 MG PO TABS: 100.0000 mg | ORAL_TABLET | Freq: Two times a day (BID) | ORAL | 0 refills | Status: DC

## 2018-08-23 MED ORDER — METRONIDAZOLE 500 MG PO TABS: 500.0000 mg | ORAL_TABLET | Freq: Two times a day (BID) | ORAL | 0 refills | Status: DC

## 2018-08-23 MED ORDER — DOXYCYCLINE HYCLATE 100 MG PO TABS: 100.0000 mg | ORAL_TABLET | Freq: Two times a day (BID) | ORAL | 2 refills | Status: DC

## 2018-08-23 NOTE — Telephone Encounter (Signed)
Patient's medication was called in to [facility] assisted living and a verbal order given to remove PICC today. Ok per Dr. Delaine Lame.

## 2018-08-23 NOTE — Progress Notes (Signed)
I saw patient last Monday ( 1/28) in the wound clinic and thewound was looking much better with decreasing size( measurements in the wouind clinic note). ESR normalized- will DC IV antibiotic and change to PO doxy and PO flagyl. Pt says he can take Doxycycline- apparently he had a nonspecific rash many years ago but he does not think it was due to any of the antibiotics listed in his chart as allergy( sulfa, clinda and doxy) Will use Doxy as least chance compared to sulfa for rash

## 2018-08-27 NOTE — Progress Notes (Signed)
CAROL, LOFTIN ([id number]) Visit Report for 08/23/2018 Arrival Information Details Patient Name: [name], [name] Date of Service: 08/23/2018 8:30 AM Medical Record Number: [id number] Patient Account Number: [id number] Date of Birth/Sex: 07/16/1951 (67 y.o. M) Treating RN: Montey Hora Primary Care Angelyna Henderson: [name] Other Clinician: Referring Rufus Beske: [name] Treating Kelly Ranieri/Extender: [name], HOYT Weeks in Treatment: 64 Visit Information History Since Last Visit Added or deleted any medications: No Patient Arrived: Wheel Chair Any new allergies or adverse reactions: No Arrival Time: 08:33 Had a fall or experienced change in No Accompanied By: wife activities of daily living that may affect Transfer Assistance: None risk of falls: Patient Identification Verified: Yes Signs or symptoms of abuse/neglect since last visito No Secondary Verification Process Completed: Yes Hospitalized since last visit: No Patient Requires Transmission-Based No Implantable device outside of the clinic excluding No Precautions: cellular tissue based products placed in the center Patient Has Alerts: Yes since last visit: Patient Alerts: NOT Has Dressing in Place as Prescribed: Yes Diabetic Pain Present Now: No Electronic Signature(s) Signed: 08/23/2018 4:27:23 PM By: Montey Hora Entered By: Montey Hora on 08/23/2018 08:40:11 [name] ([id number]) -------------------------------------------------------------------------------- Encounter Discharge Information Details Patient Name: [name] Date of Service: 08/23/2018 8:30 AM Medical Record Number: [id number] Patient Account Number: [id number] Date of Birth/Sex: 1950-08-06 (67 y.o. M) Treating RN: Harold Barban Primary Care Cachet Mccutchen: [name] Other Clinician: Referring Travoris Bushey: [name] Treating Manya Balash/Extender: [name], HOYT Weeks in Treatment: 20 Encounter Discharge  Information Items Post Procedure Vitals Discharge Condition: Stable Temperature (F): 98.6 Ambulatory Status: Wheelchair Pulse (bpm): 86 Discharge Destination: Home Respiratory Rate (breaths/min): 16 Transportation: Private Auto Blood Pressure (mmHg): 138/72 Accompanied By: wife Schedule Follow-up Appointment: Yes Clinical Summary of Care: Electronic Signature(s) Signed: 08/26/2018 11:43:02 AM By: Harold Barban Entered By: Harold Barban on 08/23/2018 09:02:04 [name] ([id number]) -------------------------------------------------------------------------------- Lower Extremity Assessment Details Patient Name: [name] Date of Service: 08/23/2018 8:30 AM Medical Record Number: [id number] Patient Account Number: [id number] Date of Birth/Sex: 05/03/1951 (67 y.o. M) Treating RN: Montey Hora Primary Care Jayliani Wanner: [name] Other Clinician: Referring Lula Kolton: [name] Treating Zulay Corrie/Extender: [name], HOYT Weeks in Treatment: 38 Electronic Signature(s) Signed: 08/23/2018 4:27:23 PM By: Montey Hora Entered By: Montey Hora on 08/23/2018 08:41:02 [name], [name] ([id number]) -------------------------------------------------------------------------------- Multi Wound Chart Details Patient Name: [name] Date of Service: 08/23/2018 8:30 AM Medical Record Number: [id number] Patient Account Number: [id number] Date of Birth/Sex: 09/12/1950 (67 y.o. M) Treating RN: Harold Barban Primary Care Deva Ron: [name] Other Clinician: Referring Valdemar Mcclenahan: [name] Treating Navy Rothschild/Extender: [name], HOYT Weeks in Treatment: 38 Vital Signs Height(in): 73 Pulse(bpm): 86 Weight(lbs): 210 Blood Pressure(mmHg): 138/72 Body Mass Index(BMI): 28 Temperature(F): 98.6 Respiratory Rate 16 (breaths/min): Photos: [1:No Photos] [N/A:N/A] Wound Location: [1:Right Gluteal fold] [N/A:N/A] Wounding Event: [1:Pressure  Injury] [N/A:N/A] Primary Etiology: [1:Pressure Ulcer] [N/A:N/A] Comorbid History: [1:Cataracts, Hypertension, Paraplegia] [N/A:N/A] Date Acquired: [1:11/02/2017] [N/A:N/A] Weeks of Treatment: [1:38] [N/A:N/A] Wound Status: [1:Open] [N/A:N/A] Measurements L x W x D [1:5.3x2.3x4] [N/A:N/A] (cm) Area (cm) : [1:9.574] [N/A:N/A] Volume (cm) : [1:38.296] [N/A:N/A] % Reduction in Area: [1:4.80%] [N/A:N/A] % Reduction in Volume: [1:-3710.50%] [N/A:N/A] Classification: [1:Category/Stage IV] [N/A:N/A] Exudate Amount: [1:Medium] [N/A:N/A] Exudate Type: [1:Serosanguineous] [N/A:N/A] Exudate Color: [1:red, brown] [N/A:N/A] Wound Margin: [1:Epibole] [N/A:N/A] Granulation Amount: [1:Large (67-100%)] [N/A:N/A] Granulation Quality: [1:Red, Pink, Hyper-granulation] [N/A:N/A] Necrotic Amount: [1:Small (1-33%)] [N/A:N/A] Exposed Structures: [1:Fat Layer (Subcutaneous Tissue) Exposed: Yes Fascia: No Tendon: No Muscle: No Joint: No Bone: No] [N/A:N/A] Epithelialization: [1:Small (1-33%)] [N/A:N/A] Periwound  Skin Texture: [1:Rash: Yes Excoriation: No Induration: No Callus: No Crepitus: No Scarring: No] [N/A:N/A] Periwound Skin Moisture: Maceration: No N/A N/A Dry/Scaly: No Periwound Skin Color: Atrophie Blanche: No N/A N/A Cyanosis: No Ecchymosis: No Erythema: No Hemosiderin Staining: No Mottled: No Pallor: No Rubor: No Temperature: No Abnormality N/A N/A Tenderness on Palpation: No N/A N/A Wound Preparation: Ulcer Cleansing: N/A N/A Rinsed/Irrigated with Saline Topical Anesthetic Applied: Other: lidocaine 4% Treatment Notes Electronic Signature(s) Signed: 08/26/2018 11:43:02 AM By: Harold Barban Entered By: Harold Barban on 08/23/2018 08:53:42 [name], [name] ([id number]) -------------------------------------------------------------------------------- [place] Details Patient Name: [name] Date of Service: 08/23/2018 8:30 AM Medical Record  Number: [id number] Patient Account Number: [id number] Date of Birth/Sex: 1950/08/30 (67 y.o. M) Treating RN: Harold Barban Primary Care Dale Ribeiro: [name] Other Clinician: Referring Shaundra Fullam: [name] Treating Estela Vinal/Extender: [name], HOYT Weeks in Treatment: 7 Active Inactive Orientation to the Wound Care Program Nursing Diagnoses: Knowledge deficit related to the wound healing center program Goals: Patient/caregiver will verbalize understanding of the [place] Program Date Initiated: 11/30/2017 Target Resolution Date: 12/21/2017 Goal Status: Active Interventions: Provide education on orientation to the wound center Notes: Pressure Nursing Diagnoses: Knowledge deficit related to management of pressures ulcers Goals: Patient/caregiver will verbalize understanding of pressure ulcer management Date Initiated: 05/17/2018 Target Resolution Date: 05/28/2018 Goal Status: Active Interventions: Assess: immobility, friction, shearing, incontinence upon admission and as needed Notes: Wound/Skin Impairment Nursing Diagnoses: Impaired tissue integrity Goals: Patient/caregiver will verbalize understanding of skin care regimen Date Initiated: 11/30/2017 Target Resolution Date: 12/21/2017 Goal Status: Active Ulcer/skin breakdown will have a volume reduction of 30% by week 4 Date Initiated: 11/30/2017 Target Resolution Date: 12/21/2017 Goal Status: Active [name], [name] ([id number]) Interventions: Assess patient/caregiver ability to obtain necessary supplies Assess patient/caregiver ability to perform ulcer/skin care regimen upon admission and as needed Assess ulceration(s) every visit Treatment Activities: Skin care regimen initiated : 11/30/2017 Notes: Electronic Signature(s) Signed: 08/26/2018 11:43:02 AM By: Harold Barban Entered By: Harold Barban on 08/23/2018 08:53:30 [name], [name]  ([id number]) -------------------------------------------------------------------------------- Pain Assessment Details Patient Name: [name] Date of Service: 08/23/2018 8:30 AM Medical Record Number: [id number] Patient Account Number: [id number] Date of Birth/Sex: 08-Aug-1950 (67 y.o. M) Treating RN: Montey Hora Primary Care Lysandra Loughmiller: [name] Other Clinician: Referring Abril Cappiello: [name] Treating Cierrah Dace/Extender: [name], HOYT Weeks in Treatment: 97 Active Problems Location of Pain Severity and Description of Pain Patient Has Paino No Site Locations Pain Management and Medication Current Pain Management: Electronic Signature(s) Signed: 08/23/2018 4:27:23 PM By: Montey Hora Entered By: Montey Hora on 08/23/2018 08:40:18 [name] ([id number]) -------------------------------------------------------------------------------- Patient/Caregiver Education Details Patient Name: [name] Date of Service: 08/23/2018 8:30 AM Medical Record Number: [id number] Patient Account Number: [id number] Date of Birth/Gender: 1951/04/19 (67 y.o. M) Treating RN: Harold Barban Primary Care Physician: [name] Other Clinician: Referring Physician: [name] Treating Physician/Extender: Sharalyn Ink in Treatment: 66 Education Assessment Education Provided To: Patient Education Topics Provided Wound/Skin Impairment: Handouts: Caring for Your Ulcer Methods: Demonstration, Explain/Verbal Responses: State content correctly Electronic Signature(s) Signed: 08/26/2018 11:43:02 AM By: Harold Barban Entered By: Harold Barban on 08/23/2018 09:02:17 [name] ([id number]) -------------------------------------------------------------------------------- Wound Assessment Details Patient Name: [name] Date of Service: 08/23/2018 8:30 AM Medical Record Number: [id number] Patient Account Number:  [id number] Date of Birth/Sex: Nov 15, 1950 (67 y.o. M) Treating RN: Montey Hora Primary Care Ladasha Schnackenberg: [name] Other Clinician: Referring Aquilla Shambley: [name] Treating Nathaneil Feagans/Extender: [name], HOYT Weeks in Treatment: 38 Wound  Status Wound Number: 1 Primary Etiology: Pressure Ulcer Wound Location: Right Gluteal fold Wound Status: Open Wounding Event: Pressure Injury Comorbid History: Cataracts, Hypertension, Paraplegia Date Acquired: 11/02/2017 Weeks Of Treatment: 38 Clustered Wound: No Photos Photo Uploaded By: Montey Hora on 08/23/2018 09:36:02 Wound Measurements Length: (cm) 5.3 Width: (cm) 2.3 Depth: (cm) 4 Area: (cm) 9.574 Volume: (cm) 38.296 % Reduction in Area: 4.8% % Reduction in Volume: -3710.5% Epithelialization: Small (1-33%) Tunneling: No Undermining: No Wound Description Classification: Category/Stage IV Wound Margin: Epibole Exudate Amount: Medium Exudate Type: Serosanguineous Exudate Color: red, brown Foul Odor After Cleansing: No Slough/Fibrino Yes Wound Bed Granulation Amount: Large (67-100%) Exposed Structure Granulation Quality: Red, Pink, Hyper-granulation Fascia Exposed: No Necrotic Amount: Small (1-33%) Fat Layer (Subcutaneous Tissue) Exposed: Yes Necrotic Quality: Adherent Slough Tendon Exposed: No Muscle Exposed: No Joint Exposed: No Bone Exposed: No Periwound Skin Texture [name], [name] ([id number]) Texture Color No Abnormalities Noted: No No Abnormalities Noted: No Callus: No Atrophie Blanche: No Crepitus: No Cyanosis: No Excoriation: No Ecchymosis: No Induration: No Erythema: No Rash: Yes Hemosiderin Staining: No Scarring: No Mottled: No Pallor: No Moisture Rubor: No No Abnormalities Noted: No Dry / Scaly: No Temperature / Pain Maceration: No Temperature: No Abnormality Wound Preparation Ulcer Cleansing: Rinsed/Irrigated with Saline Topical Anesthetic Applied: Other: lidocaine  4%, Treatment Notes Wound #1 (Right Gluteal fold) Notes moistened saline gauze, bordered foam dressing; SNF nurses to reapply NPWT Electronic Signature(s) Signed: 08/23/2018 4:27:23 PM By: Montey Hora Entered By: Montey Hora on 08/23/2018 08:47:22 [name], Brett Mate ([id number]) -------------------------------------------------------------------------------- [place] Details Patient Name: [name] Date of Service: 08/23/2018 8:30 AM Medical Record Number: [id number] Patient Account Number: [id number] Date of Birth/Sex: January 01, 1951 (67 y.o. M) Treating RN: Montey Hora Primary Care Nichalos Brenton: [name] Other Clinician: Referring Roselle Norton: [name] Treating Nivan Melendrez/Extender: [name], HOYT Weeks in Treatment: 38 Vital Signs Time Taken: 08:40 Temperature (F): 98.6 Height (in): 73 Pulse (bpm): 86 Weight (lbs): 210 Respiratory Rate (breaths/min): 16 Body Mass Index (BMI): 27.7 Blood Pressure (mmHg): 138/72 Reference Range: 80 - 120 mg / dl Airway Electronic Signature(s) Signed: 08/23/2018 4:27:23 PM By: Montey Hora Entered By: Montey Hora on 08/23/2018 08:40:55

## 2018-08-27 NOTE — Progress Notes (Signed)
EDDI, HYMES ([id number]) Visit Report for 08/23/2018 Chief Complaint Document Details Patient Name: [name], [name] Date of Service: 08/23/2018 8:30 AM Medical Record Number: [id number] Patient Account Number: [id number] Date of Birth/Sex: Feb 16, 1951 (68 y.o. M) Treating RN: Harold Barban Primary Care Provider: [name] Other Clinician: Referring Provider: [name] Treating Provider/Extender: [name], HOYT Weeks in Treatment: 41 Information Obtained from: Patient Chief Complaint Right gluteal fold ulcer Electronic Signature(s) Signed: 08/24/2018 8:18:44 AM By: Worthy Keeler [name] Entered By: Worthy Keeler on 08/23/2018 08:30:30 [name], [name] ([id number]) -------------------------------------------------------------------------------- Debridement Details Patient Name: [name] Date of Service: 08/23/2018 8:30 AM Medical Record Number: [id number] Patient Account Number: [id number] Date of Birth/Sex: 21-May-1951 (68 y.o. M) Treating RN: Harold Barban Primary Care Provider: [name] Other Clinician: Referring Provider: [name] Treating Provider/Extender: [name], HOYT Weeks in Treatment: 38 Debridement Performed for Wound #1 Right Gluteal fold Assessment: Performed By: Physician [name], HOYT E., [name] Debridement Type: Debridement Level of Consciousness (Pre- Awake and Alert procedure): Pre-procedure Verification/Time Yes - 08:56 Out Taken: Start Time: 08:56 Pain Control: Lidocaine Total Area Debrided (L x W): 5.3 (cm) x 2.3 (cm) = 12.19 (cm) Tissue and other material Slough, Subcutaneous, Biofilm, Slough debrided: Level: Skin/Subcutaneous Tissue Debridement Description: Excisional Instrument: Curette Bleeding: Minimum Hemostasis Achieved: Pressure End Time: 09:00 Procedural Pain: 0 Post Procedural Pain: 0 Response to Treatment: Procedure was tolerated well Level of Consciousness Awake and  Alert (Post-procedure): Post Debridement Measurements of Total Wound Length: (cm) 5.3 Stage: Category/Stage IV Width: (cm) 2.3 Depth: (cm) 4 Volume: (cm) 38.296 Character of Wound/Ulcer Post Improved Debridement: Post Procedure Diagnosis Same as Pre-procedure Electronic Signature(s) Signed: 08/24/2018 8:18:44 AM By: Worthy Keeler [name] Signed: 08/26/2018 11:43:02 AM By: Harold Barban Entered By: Worthy Keeler on 08/23/2018 10:15:27 [name], [name] ([id number]) -------------------------------------------------------------------------------- HPI Details Patient Name: [name] Date of Service: 08/23/2018 8:30 AM Medical Record Number: [id number] Patient Account Number: [id number] Date of Birth/Sex: [birth year] (68 y.o. 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 He states that there is no history of diabetes that he is aware of. He does have issues with his prostate and is currently receiving treatment for this by way of oral medication. With that being said I do not have a lot of details in that regard. Nonetheless the patient presents today as a result of having been referred to [place] by another provider initially home health was set to come out and take care of his wound although due to the fact that he apparently drives he's not able to receive home health. His wife is therefore trying to help take care of this wound within although they have been struggling with what exactly to do at this point. She states that she can do  some things but she is definitely not a nurse and does have some issues with looking at blood. The good news is the wound does not appear to be too deep and is fairly superficial at this point. There is no slough noted there is some nonviable skin noted around the surface of the wound  and the perimeter at this point. The central portion of the wound appears to be very good with a dermal layer noted this does not appear to be again deep enough to extend it to subcutaneous tissue at this point. 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 In the pump he has Dilaudid and baclofen. 12/07/17 on evaluation today patient presents for follow-up concerning his ongoing lower back thigh ulcer on the right. He states that he did not get the supplies ordered and therefore has not really been able to perform the dressing changes as directed exactly. His wife was able to get some Boarder Foam Dressing's from the drugstore and subsequently has been using hydrogel which did help to a degree in the wound does appear to be able smaller. There is actually more drainage this week noted than previous. 12/21/17 on evaluation today patient appears to be doing rather well in regard to his right gluteal ulcer. He has been tolerating the dressing changes without complication. There does not appear to be any evidence of infection at this point in time. Overall the wound does seem to be making some progress as far as the edges are concerned there's not as much in the way of overlapping of the external wound edges and he has a good epithelium to wound bed border for the most part. This however is not true right at the 12 o'clock location over the span of a little over a centimeters which actually will require debridement today to clean this away and hopefully allow it to continue to heal more appropriately. 12/28/17 on evaluation  today patient appears to be doing rather well in regard to his ulcer in the left gluteal region. He's been tolerating the dressing changes without complication. Apparently he has had some difficulty getting his dressing material. Apparently there's been some confusion with ordering we're gonna check into this. Nonetheless overall he's been showing signs of improvement which is good news. Debridement is not required today. 01/04/18 on evaluation today patient presents for follow-up concerning his right gluteal ulcer. He has been tolerating the dressing changes fairly well. On inspection today it appears he may actually have some maceration them concerned about the fact that he may be developing too much moisture in and around the wound bed which can cause delay in healing. With that being said he unfortunately really has not showed significant signs of improvement since last week's evaluation in fact this may even be just the little bit/slightly larger. Nonetheless he's been having a lot of discomfort I'm not sure this is even related to the wound as he has no pain when I'm to breeding or otherwise cleaning the wound during evaluation today. Nonetheless this is something that we did recommend he talked to his pain specialist concerning. 01/11/18 on evaluation today patient appears to be doing better in regard to his ulceration. He has been tolerating the dressing changes without complication. With that being said overall there's no evidence of infection which is good news. The only thing is he did receive the hatch affair blue classic versus the ready nonetheless I feel like this is perfectly fine and appears to have done well for him over the past week. 01/25/18 on evaluation today patient's wound actually appears to be a little bit larger than during the last evaluation. The good [name], [name]. ([id number]) news is the majority of the wound edges  actually appear to be fairly firmly attached to the  wound bed unfortunately again we're not really making progress in regard to the size. Roughly the wound is about the same size as when I first saw him although again the wound margin/edges appear to be much better. 02/01/18 on evaluation today patient actually appears to be doing very well in regard to his wound. Applying the Prisma dry does seem to be better although he does still have issues with slow progression of the wound. There was a slight improvement compared to last week's measurements today. Nonetheless I have been considering other options as far as the possibility of Theraskin or even a snap vac. In general I'm not sure that the Theraskin due to location of the wound would be a very good idea. Nonetheless I do think that a snap vac could be a possibility for the patient and in fact I think this could even be an excellent way to manage the wound possibly seeing some improvement in a very rapid fashion here. Nonetheless this is something that we would need to get approved and I did have a lengthy conversation with the patient about this today. 02/08/18 on evaluation today patient appears to be doing a little better in regard to his ulcer. He has been tolerating the dressing changes without complication. Fortunately despite the fact that the wound is a little bit smaller it's not significantly so unfortunately. We have discussed the possibility of a snap vac we did check with insurance this is actually covered at this point. Fortunately there does not appear to be any sign of infection. Overall I'm fairly pleased with how things seem to be appearing at this point. 02/15/18 on evaluation today patient appears to be doing rather well in regard to his right gluteal ulcer. Unfortunately the snap vac did not stay in place with his sheer and friction this came loose and did not seem to maintain seal very well. He worked for about two days and it did seem to do very well during that time according  to his wife but in general this does not seem to be something that's gonna be beneficial for him long-term. I do believe we need to go back to standard dressings to see if we can find something that will be of benefit. 03/02/18- He is here in follow up evaluation; there is minimal change in the wound. He will continue with the same treatment plan, would consider changing to iodosrob/iodoflex if ulcer continues to to plateau. He will follow up next week 03/08/18 on evaluation today patient's wound actually appears to be about the same size as when I previously saw him several weeks back. Unfortunately he does have some slightly dark discoloration in the central portion of the wound which has me concerned about pressure injury. I do believe he may be sitting for too long a period of time in fact he tells me that "I probably sit for much too long". He does have some Slough noted on the surface of the wound and again as far as the size of the wound is concerned I'm really not seeing anything that seems to have improved significantly. 03/15/18 on evaluation today patient appears to be doing fairly well in regard to his ulcer. The wound measured pretty much about the same today compared to last week's evaluation when looking at his graph. With that being said the area of bruising/deep tissue injury that was noted last week I do not see at this point.  He did get a new cushion fortunately this does seem to be have been of benefit in my pinion. It does appear that he's been off of this more which is good news as well I think that is definitely showing in the overall wound measurements. With that being said I do believe that he needs to continue to offload I don't think that the fact this is doing better should be or is going to allow him to not have to offload and explain this to him as well. Overall he seems to be in agreement the plan I think he understands. The overall appearance of the wound bed is improved  compared to last week I think the Iodoflex has been beneficial in that regard. 03/29/18 on evaluation today patient actually appears to be doing rather well in regard to his wound from the overall appearance standpoint he does have some granulation although there's some Slough on the surface of the wound noted as well. With that being said he unfortunately has not improved in regard to the overall measurement of the wound in volume or in size. I did have a discussion with him very specifically about offloading today. He actually does work although he mainly is just sitting throughout the day. He tells me he offloads by "lifting himself up for 30 seconds off of his chair occasionally" purchase from [employer] which does seem to have helped. And he has a new cushion that he with that being said he's also able to stand some for a very short period of time but not significant enough I think to provide appropriate offloading. I think the biggest issue at this point with the wound and the fact is not healing as quickly as we would like is due to the fact that he is really not able to appropriately offload while at work. He states the beginning after his injury he actually had a bed at his job that he could lay on in order to offload and that does seem to have been of help back at that time. Nonetheless he had not done this in quite some time unfortunately. I think that could be helpful for him this is something I would like for him to look into. 04/05/18 on evaluation today patient actually presents for follow-up concerning his right gluteal ulcer. Again he really is not significantly improved even compared to last week. He has been tolerating the dressing changes without complication. With that being said fortunately there appears to be no evidence of infection at this time. He has been more proactive in trying to offload. [name], [name] ([id number]) 04/12/18 on evaluation today patient actually  appears to be doing a little better in regard to his wound and the right gluteal fold region. He's been tolerating the dressing changes since removing the oasis without complication. However he was having a lot of burning initially with the oasis in place. He's unsure of exactly why this was given so much discomfort but he assumes that it was the oasis itself causing the problem. Nonetheless this had to be removed after about three days in place although even those three days seem to have made a fairly good improvement in regard to the overall appearance of the wound bed. In fact is the first time that he's made any improvement from the standpoint of measurements in about six weeks. He continues to have no discomfort over the area of the wound itself which leads me to wonder why he was having the  burning with the oasis when he does not even feel the actual debridement's themselves. I am somewhat perplexed by this. 04/19/18 on evaluation today patient's wound actually appears to be showing signs of epithelialization around the edge of the wound and in general actually appears to be doing better which is good news. He did have the same burning after about three days with applying the Endoform last week in the same fashion that I would generally apply a skin substitute. This seems to indicate that it's not the oasis to cause the problem but potentially the moisture buildup that just causes things to burn or there may be some other reaction with the skin prep or Steri-Strips. Nonetheless I'm not sure that is gonna be able to tolerate any skin substitute for a long period of time. The good news is the wound actually appears to be doing better today compared to last week and does seem to finally be making some progress. 04/26/18 on evaluation today patient actually appears to be doing rather well in regard to his ulcer in the right gluteal fold. He has been tolerating the dressing changes without  complication which is good news. The Endoform does seem to be helping although he was a little bit more macerated this week. This seems to be an ongoing issue with fluid control at this point. Nonetheless I think we may be able to add something like Drawtex to help control the drainage. 05/03/18 on evaluation today patient appears to actually be doing better in regard to the overall appearance of his wound. He has been tolerating the dressing changes without complication. Fortunately there appears to be no evidence of infection at this time. I really feel like his wound has shown signs as of today of turning around last week I thought so as well and definitely he could be seen in this week's overall appearance and measurements. In general I'm very pleased with the fact that he finally seems to be making a steady but sure progress. The patient likewise is very pleased. 05/17/18 on evaluation today patient appears to be doing more poorly unfortunately in regard to his ulcer. He has been tolerating the dressing changes without complication. With that being said he tells me that in the past couple of days he and his wife have noticed that we did not seem to be doing quite as well is getting dark near the center. Subsequently upon evaluation today the wound actually does appear to be doing worse compared to previous. He has been tolerating the dressing changes otherwise and he states that he is not been sitting up anymore than he was in the past from what he tells me. Still he has continued to work he states "I'm tired of dealing with this and if I have to just go home and lay in the bed all the time that's what I'll do". Nonetheless I am concerned about the fact that this wound does appear to be deeper than what it was previous. 05/24/18 upon evaluation today patient actually presents after having been in the hospital due to what was presumed to be sepsis secondary to the wound infection. He had an  elevated white blood cell count between 14 and 15. With that being said he does seem to be doing somewhat better now. His wound still is giving him some trouble nonetheless and he is obviously concerned about the fact likely talked about that this does seem to go more deeply than previously noted. I did review his wound  culture which showed evidence of Staphylococcus aureus him and group B strep. Nonetheless he is on antibiotics, Levaquin, for this. Subsequently I did review his intake summary from the hospital as well. I also did look at the CT of the lumbar spine with contrast that was performed which showed no bone destruction to suggest lumbar disguises/osteomyelitis or sacral osteomyelitis. There was no paraspinal abscess. Nonetheless it appears this may have been more of just a soft tissue infection at this point which is good news. He still is nonetheless concerned about the wound which again I think is completely reasonable considering everything he's been through recently. 05/31/18 on evaluation today on evaluation today patient actually appears to be showing signs of his wound be a little bit deeper than what I would like to see. Fortunately he does not show any signs of significant infection although his temperature was 99 today he states he's been checking this at home and has not been elevated. Nonetheless with the undermining that I'm seeing at this point I am becoming more concerned about the wound I do think that offloading is a key factor here that is preventing the speedy recovery at this point. There does not appear to be any evidence of again over infection noted. He's been using Santyl currently. 06/07/18 the patient presents today for follow-up evaluation regarding the left ulcer in the gluteal region. He has been tolerating the Wound VAC fairly well. He is obviously very frustrated with this he states that to mean is really getting in his way. There does not appear to be any  evidence of infection at this time he does have a little bit of odor I do not necessarily associate this with infection just something that we sometimes notice with Wound VAC therapy. With that being said I can definitely catch a tone of discontentment overall in the patient's demeanor today. This when he was previously in the hospital [name], [name]. ([id number]) an CT scan was done of the lumbar region which did not reveal any signs of osteomyelitis. With that being said the pelvis in particular was not evaluated distinctly which means he could still have some osteonecrosis I. Nonetheless the Wound VAC was started on Thursday I do want to get this little bit more time before jumping to a CT scan of the pelvis although that is something that I might would recommend if were not see an improvement by that time. 06/14/18 on evaluation today patient actually appears to be doing about the same in regard to his right gluteal ulcer. Again he did have a CT scan of the lumbar spine unfortunately this did not include the pelvis. Nonetheless with the depth of the wound that I'm seeing today even despite the fact that I'm not seeing any evidence of overt cellulitis I believe there's a good chance that we may be dealing with osteomyelitis somewhere in the right Ischial region. No fevers, chills, nausea, or vomiting noted at this time. 06/21/18 on evaluation today patient actually appears to be doing about the same with regard to his wound. The tunnel at 6 o'clock really does not appear to be any deeper although it is a little bit wider. I think at this point you may want to start packing this with white phone. Unfortunately I have not got approval for the CT scan of the pelvis as of yet due to the fact that Medicare apparently has been denied it due to the diagnosis codes not being appropriate according to Medicare for the  test requested. With that being said the patient cannot have an MRI and therefore this  is the only option that we have as far as testing is concerned. The patient has had infection and was on antibiotics and been added code for cellulitis of the bottom to see if this will be appropriate for getting the test approved. Nonetheless I'm concerned about the infection have been spread deeper into the Ischial region. 06/28/18 on evaluation today patient actually appears to be doing rather well all things considered in regard to the right gluteal ulcer. He has been tolerating the dressing changes without complication. With that being said the Wound VAC he states does have to be replaced almost every day or at least reinforced unfortunately. Patient actually has his CT scan later this morning we should have the results by tomorrow. 07/05/18 on evaluation today patient presents for follow-up concerning his right Ischial ulcer. He did see the surgeon Dr. Lysle Pearl last week. They were actually very happy with him and felt like he spent a tremendous amount of time with them as far as discussing his situation was concerned. In the end Dr. Lysle Pearl did contact me as well and determine that he would not recommend any surgical intervention at this point as he felt like it would not be in the patient's best interest based on what he was seeing. He recommended a referral to infectious disease. Subsequently this is something that Dr. Ines Bloomer office is working on setting up for the patient. As far as evaluation today is concerned the patient's wound actually appears to be worse at this point. I am concerned about how things are progressing and specifically about infection. I do not feel like it's the deeper but the area of depth is definitely widening which does have me concerned. No fevers, chills, nausea, or vomiting noted at this time. I think that we do need initiate antibiotic therapy the patient has an allow allergy to amoxicillin/penicillin he states that he gets a rash since childhood. Nonetheless she's  never had the issues with Catholics or cephalosporins in general but he is aware of. 07/27/18 on evaluation today patient presents following admission to the hospital on 07/09/18. He was subsequently discharged on 07/20/18. On 07/15/18 the patient underwent irrigation and debridement was soft tissue biopsy and bone biopsy as well as placement of a Wound VAC in the OR by Dr. Celine Ahr. During the hospital course the patient was placed on a Wound VAC and recommended follow up with surgery in three weeks actually with Dr. Delaine Lame who is infectious disease. The patient was on vancomycin during the hospital course. 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 He also had a bone culture which revealed evidence of methicillin-resistant staph aureus. He is updated CT scan 07/09/18 reveals that he had progression of the which was performed on wound to breakdown down to the trochanter where he actually had irregularities there as well suggestive of osteomyelitis. This was a change just since 9 December when we last performed a CT scan. Obviously this one had gone downhill quite significantly and rapidly. At this point upon evaluation I feel like in general the patient's wound seems to be doing fairly well all things considered upon my evaluation today. Obviously this is larger and deeper than what I previously evaluated but at the same time he seems to be making some progress as far as the appearance of the granulation tissue is concerned. I'm happy in that regard. No fevers, chills,  nausea, or vomiting noted at this time. He is on IV vancomycin and Rocephin at the facility. He is currently in [facility]. 08/03/18 upon evaluation today patient's wound appears to be doing better in regard to the overall appearance at this point in time. Fortunately he's been tolerating the Wound VAC without complication and states that the facility has been taking excellent care of the  wound site. Overall I see some Slough noted on the surface which I am going to attempt sharp debridement today of but nonetheless other than this I feel like he's making progress. 08/09/18 on evaluation today patient's wound appears to be doing much better compared to even last week's evaluation. Do believe that the Wound VAC is been of great benefit for him. He has been tolerating the dressing changes that is the Wound [name], [name]. ([id number]) VAC without any complication and he has excellent granulation noted currently. There is no need for sharp debridement at this point. 08/16/18 on evaluation today patient actually appears to be doing very well in regard to the wound in the right gluteal fold region. This is showing signs of progress and again appears to be very healthy which is excellent news. Fortunately there is no sign of active infection by way of odor or drainage at this point. Overall I'm very pleased with how things stand. He seems to be tolerating the Wound VAC without complication. 08/23/18 on evaluation today patient actually appears to be doing better in regard to his wound. He has been tolerating the Wound VAC without complication and in fact it has been collecting a significant amount of drainage which I think is good news especially considering how the wound appears. Fortunately there is no signs of infection at this time definitely nothing appears to be worse which is good news. He has not been started on the Bactrim and Flagyl that was recommended by Dr. Delaine Lame yet. I did actually contact her office this morning in order to check and see were things are that regard their gonna be calling me back. Electronic Signature(s) Signed: 08/24/2018 8:18:44 AM By: Worthy Keeler [name] Entered By: Worthy Keeler on 08/23/2018 10:13:01 YUSUKE, BEZA ([id number]) -------------------------------------------------------------------------------- Physical Exam Details Patient  Name: [name] Date of Service: 08/23/2018 8:30 AM Medical Record Number: [id number] Patient Account Number: [id number] Date of Birth/Sex: Dec 28, 1950 (67 y.o. M) Treating RN: Harold Barban Primary Care Provider: [name] Other Clinician: Referring Provider: [name] Treating Provider/Extender: [name], HOYT Weeks in Treatment: 99 Constitutional Well-nourished and well-hydrated in no acute distress. Respiratory normal breathing without difficulty. clear to auscultation bilaterally. Cardiovascular regular rate and rhythm with normal S1, S2. Psychiatric this patient is able to make decisions and demonstrates good insight into disease process. Alert and Oriented x 3. pleasant and cooperative. Notes Upon inspection today patient's wound bed shows evidence of good granulation which is excellent news. There was some Slough noted which required sharp debridement today. That was tolerated by the patient without complication. Post debridement the wound bed appears to be doing much better. Electronic Signature(s) Signed: 08/24/2018 8:18:44 AM By: Worthy Keeler [name] Entered By: Worthy Keeler on 08/23/2018 10:13:31 [name], [name] ([id number]) -------------------------------------------------------------------------------- Physician Orders Details Patient Name: [name] Date of Service: 08/23/2018 8:30 AM Medical Record Number: [id number] Patient Account Number: [id number] Date of Birth/Sex: April 29, 1951 (67 y.o. M) Treating RN: Harold Barban Primary Care Provider: [name] Other Clinician: Referring Provider: [name] Treating Provider/Extender: [name], HOYT Weeks in Treatment:  4 Verbal / Phone Orders: No Diagnosis Coding ICD-10 Coding Code Description L89.314 Pressure ulcer of right buttock, stage 4 L03.317 Cellulitis of buttock G82.20 Paraplegia, unspecified S34.109S Unspecified injury to unspecified level of lumbar  spinal cord, sequela I10 Essential (primary) hypertension Wound Cleansing Wound #1 Right Gluteal fold o Cleanse wound with mild soap and water Anesthetic (add to Medication List) Wound #1 Right Gluteal fold o Topical Lidocaine 4% cream applied to wound bed prior to debridement (In Clinic Only). Primary Wound Dressing Wound #1 Right Gluteal fold o Saline moistened gauze - Continue Wound Vac Secondary Dressing Wound #1 Right Gluteal fold o Boardered Foam Dressing Dressing Change Frequency Wound #1 Right Gluteal fold o Change Dressing Monday, Wednesday, Friday - and as needed Follow-up Appointments Wound #1 Right Gluteal fold o Return Appointment in 1 week. Off-Loading Wound #1 Right Gluteal fold o Mattress - Please continue air mattress at SNF o Turn and reposition every 2 hours Negative Pressure Wound Therapy Wound #1 Right Gluteal fold Aloisi, Taym J. ([id number]) o Wound VAC settings at 125/130 mmHg continuous pressure. Use BLACK/GREEN foam to wound cavity. Use WHITE foam to fill any tunnel/s and/or undermining. Change VAC dressing 3 X WEEK. Change canister as indicated when full. Nurse may titrate settings and frequency of dressing changes as clinically indicated. - white foam to any tunnels o Home Health Nurse may d/c VAC for s/s of increased infection, significant wound regression, or uncontrolled drainage. [place] at [phone]. o Number of foam/gauze pieces used in the dressing = Medications-please add to medication list. Wound #1 Right Gluteal fold o Santyl Enzymatic Ointment - Please discontinue santyl under NPWT o Other: - IV antibiotics, difucan PO Electronic Signature(s) Signed: 08/24/2018 8:18:44 AM By: Worthy Keeler [name] Signed: 08/26/2018 11:43:02 AM By: Harold Barban Entered By: Harold Barban on 08/23/2018 09:00:35 [name], [name]  ([id number]) -------------------------------------------------------------------------------- Problem List Details Patient Name: [name] Date of Service: 08/23/2018 8:30 AM Medical Record Number: [id number] Patient Account Number: [id number] Date of Birth/Sex: 1951-02-18 (67 y.o. M) Treating RN: Harold Barban Primary Care Provider: [name] Other Clinician: Referring Provider: [name] Treating Provider/Extender: [name], HOYT Weeks in Treatment: 24 Active Problems ICD-10 Evaluated Encounter Code Description Active Date Today Diagnosis L89.314 Pressure ulcer of right buttock, stage 4 11/30/2017 No Yes L03.317 Cellulitis of buttock 06/21/2018 No Yes G82.20 Paraplegia, unspecified 11/30/2017 No Yes S34.109S Unspecified injury to unspecified level of lumbar spinal cord, 11/30/2017 No Yes sequela I10 Essential (primary) hypertension 11/30/2017 No Yes Inactive Problems Resolved Problems Electronic Signature(s) Signed: 08/24/2018 8:18:44 AM By: Worthy Keeler [name] Entered By: Worthy Keeler on 08/23/2018 08:30:25 JEJUAN, SCALA ([id number]) -------------------------------------------------------------------------------- Progress Note Details Patient Name: [name] Date of Service: 08/23/2018 8:30 AM Medical Record Number: [id number] Patient Account Number: [id number] Date of Birth/Sex: 1951/03/23 (67 y.o. 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 He states that there is no history of diabetes that he is aware of. He does have issues with his prostate and is currently receiving treatment  for this by way of oral medication. With that being said I do not have a lot of details in that regard. Nonetheless the patient presents today as a result of having been referred to [place] by another provider initially home health was set to come out and take care of his wound although due to the fact that he apparently drives he's not able to receive home health. His wife is therefore trying to help take care of this wound within although they have been struggling with what exactly to do at this point. She states that she can do some things but she is definitely not a nurse and does have some issues with looking at blood. The good news is the wound does not appear to be too deep and is fairly superficial at this point. There is no slough noted there is some nonviable skin noted around the surface of the wound and the perimeter at this point. The central portion of the wound appears to be very good with a dermal layer noted this does not appear to be again deep enough to extend it to subcutaneous tissue at this point. 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 In the pump he has Dilaudid and baclofen. 12/07/17 on evaluation today patient presents for follow-up concerning his ongoing lower back thigh ulcer on the right. He states that he did not get the supplies ordered and therefore has not really been able to perform the dressing changes as directed exactly. His wife was able to get some Boarder Foam Dressing's from the drugstore and subsequently has been using hydrogel which did help to a degree in the wound does appear to be able smaller. There is actually more drainage this week noted than previous. 12/21/17 on evaluation today  patient appears to be doing rather well in regard to his right gluteal ulcer. He has been tolerating the dressing changes without complication. There does not appear to be any evidence of infection at this point in time. Overall the wound does seem to be making some progress as far as the edges are concerned there's not as much in the way of overlapping of the external wound edges and he has a good epithelium to wound bed border for the most part. This however is not true right at the 12 o'clock location over the span of a little over a centimeters which actually will require debridement today to clean this away and hopefully allow it to continue to heal more appropriately. 12/28/17 on evaluation today patient appears to be doing rather well in regard to his ulcer in the left gluteal region. He's been tolerating the dressing changes without complication. Apparently he has had some difficulty getting his dressing material. Apparently there's been some confusion with ordering we're gonna check into this. Nonetheless overall he's been showing signs of improvement which is good news. Debridement is not required today. 01/04/18 on evaluation today patient presents for follow-up concerning his right gluteal ulcer. He has been tolerating the dressing changes fairly well. On inspection today it appears he may actually have some maceration them concerned about the fact that he may be developing too much moisture in and around the wound bed which can cause delay in healing. With that being said he unfortunately really has not showed significant signs of improvement since last week's evaluation in fact this may even be just the little bit/slightly larger.  Nonetheless he's been having a lot of discomfort I'm not sure this is even related to the wound as he has no pain when I'm to breeding or otherwise cleaning the wound during evaluation today. Nonetheless this is something that we did recommend he talked to his  pain specialist concerning. 01/11/18 on evaluation today patient appears to be doing better in regard to his ulceration. He has been tolerating the dressing Berti, Arrington J. ([id number]) changes without complication. With that being said overall there's no evidence of infection which is good news. The only thing is he did receive the hatch affair blue classic versus the ready nonetheless I feel like this is perfectly fine and appears to have done well for him over the past week. 01/25/18 on evaluation today patient's wound actually appears to be a little bit larger than during the last evaluation. The good news is the majority of the wound edges actually appear to be fairly firmly attached to the wound bed unfortunately again we're not really making progress in regard to the size. Roughly the wound is about the same size as when I first saw him although again the wound margin/edges appear to be much better. 02/01/18 on evaluation today patient actually appears to be doing very well in regard to his wound. Applying the Prisma dry does seem to be better although he does still have issues with slow progression of the wound. There was a slight improvement compared to last week's measurements today. Nonetheless I have been considering other options as far as the possibility of Theraskin or even a snap vac. In general I'm not sure that the Theraskin due to location of the wound would be a very good idea. Nonetheless I do think that a snap vac could be a possibility for the patient and in fact I think this could even be an excellent way to manage the wound possibly seeing some improvement in a very rapid fashion here. Nonetheless this is something that we would need to get approved and I did have a lengthy conversation with the patient about this today. 02/08/18 on evaluation today patient appears to be doing a little better in regard to his ulcer. He has been tolerating the dressing changes without  complication. Fortunately despite the fact that the wound is a little bit smaller it's not significantly so unfortunately. We have discussed the possibility of a snap vac we did check with insurance this is actually covered at this point. Fortunately there does not appear to be any sign of infection. Overall I'm fairly pleased with how things seem to be appearing at this point. 02/15/18 on evaluation today patient appears to be doing rather well in regard to his right gluteal ulcer. Unfortunately the snap vac did not stay in place with his sheer and friction this came loose and did not seem to maintain seal very well. He worked for about two days and it did seem to do very well during that time according to his wife but in general this does not seem to be something that's gonna be beneficial for him long-term. I do believe we need to go back to standard dressings to see if we can find something that will be of benefit. 03/02/18- He is here in follow up evaluation; there is minimal change in the wound. He will continue with the same treatment plan, would consider changing to iodosrob/iodoflex if ulcer continues to to plateau. He will follow up next week 03/08/18 on evaluation today patient's wound actually appears  to be about the same size as when I previously saw him several weeks back. Unfortunately he does have some slightly dark discoloration in the central portion of the wound which has me concerned about pressure injury. I do believe he may be sitting for too long a period of time in fact he tells me that "I probably sit for much too long". He does have some Slough noted on the surface of the wound and again as far as the size of the wound is concerned I'm really not seeing anything that seems to have improved significantly. 03/15/18 on evaluation today patient appears to be doing fairly well in regard to his ulcer. The wound measured pretty much about the same today compared to last week's  evaluation when looking at his graph. With that being said the area of bruising/deep tissue injury that was noted last week I do not see at this point. He did get a new cushion fortunately this does seem to be have been of benefit in my pinion. It does appear that he's been off of this more which is good news as well I think that is definitely showing in the overall wound measurements. With that being said I do believe that he needs to continue to offload I don't think that the fact this is doing better should be or is going to allow him to not have to offload and explain this to him as well. Overall he seems to be in agreement the plan I think he understands. The overall appearance of the wound bed is improved compared to last week I think the Iodoflex has been beneficial in that regard. 03/29/18 on evaluation today patient actually appears to be doing rather well in regard to his wound from the overall appearance standpoint he does have some granulation although there's some Slough on the surface of the wound noted as well. With that being said he unfortunately has not improved in regard to the overall measurement of the wound in volume or in size. I did have a discussion with him very specifically about offloading today. He actually does work although he mainly is just sitting throughout the day. He tells me he offloads by "lifting himself up for 30 seconds off of his chair occasionally" purchase from [facility] which does seem to have helped. And he has a new cushion that he with that being said he's also able to stand some for a very short period of time but not significant enough I think to provide appropriate offloading. I think the biggest issue at this point with the wound and the fact is not healing as quickly as we would like is due to the fact that he is really not able to appropriately offload while at work. He states the beginning after his injury he actually had a bed at his  job that he could lay on in order to offload and that does seem to have been of help back at that time. Nonetheless he had not done this in quite some time unfortunately. I think that could be helpful for him this is something I would like for him to look into. [name], [name] ([id number]) 04/05/18 on evaluation today patient actually presents for follow-up concerning his right gluteal ulcer. Again he really is not significantly improved even compared to last week. He has been tolerating the dressing changes without complication. With that being said fortunately there appears to be no evidence of infection at this time. He has been more  proactive in trying to offload. 04/12/18 on evaluation today patient actually appears to be doing a little better in regard to his wound and the right gluteal fold region. He's been tolerating the dressing changes since removing the oasis without complication. However he was having a lot of burning initially with the oasis in place. He's unsure of exactly why this was given so much discomfort but he assumes that it was the oasis itself causing the problem. Nonetheless this had to be removed after about three days in place although even those three days seem to have made a fairly good improvement in regard to the overall appearance of the wound bed. In fact is the first time that he's made any improvement from the standpoint of measurements in about six weeks. He continues to have no discomfort over the area of the wound itself which leads me to wonder why he was having the burning with the oasis when he does not even feel the actual debridement's themselves. I am somewhat perplexed by this. 04/19/18 on evaluation today patient's wound actually appears to be showing signs of epithelialization around the edge of the wound and in general actually appears to be doing better which is good news. He did have the same burning after about three days with applying the  Endoform last week in the same fashion that I would generally apply a skin substitute. This seems to indicate that it's not the oasis to cause the problem but potentially the moisture buildup that just causes things to burn or there may be some other reaction with the skin prep or Steri-Strips. Nonetheless I'm not sure that is gonna be able to tolerate any skin substitute for a long period of time. The good news is the wound actually appears to be doing better today compared to last week and does seem to finally be making some progress. 04/26/18 on evaluation today patient actually appears to be doing rather well in regard to his ulcer in the right gluteal fold. He has been tolerating the dressing changes without complication which is good news. The Endoform does seem to be helping although he was a little bit more macerated this week. This seems to be an ongoing issue with fluid control at this point. Nonetheless I think we may be able to add something like Drawtex to help control the drainage. 05/03/18 on evaluation today patient appears to actually be doing better in regard to the overall appearance of his wound. He has been tolerating the dressing changes without complication. Fortunately there appears to be no evidence of infection at this time. I really feel like his wound has shown signs as of today of turning around last week I thought so as well and definitely he could be seen in this week's overall appearance and measurements. In general I'm very pleased with the fact that he finally seems to be making a steady but sure progress. The patient likewise is very pleased. 05/17/18 on evaluation today patient appears to be doing more poorly unfortunately in regard to his ulcer. He has been tolerating the dressing changes without complication. With that being said he tells me that in the past couple of days he and his wife have noticed that we did not seem to be doing quite as well is getting dark  near the center. Subsequently upon evaluation today the wound actually does appear to be doing worse compared to previous. He has been tolerating the dressing changes otherwise and he states that he is not  been sitting up anymore than he was in the past from what he tells me. Still he has continued to work he states "I'm tired of dealing with this and if I have to just go home and lay in the bed all the time that's what I'll do". Nonetheless I am concerned about the fact that this wound does appear to be deeper than what it was previous. 05/24/18 upon evaluation today patient actually presents after having been in the hospital due to what was presumed to be sepsis secondary to the wound infection. He had an elevated white blood cell count between 14 and 15. With that being said he does seem to be doing somewhat better now. His wound still is giving him some trouble nonetheless and he is obviously concerned about the fact likely talked about that this does seem to go more deeply than previously noted. I did review his wound culture which showed evidence of Staphylococcus aureus him and group B strep. Nonetheless he is on antibiotics, Levaquin, for this. Subsequently I did review his intake summary from the hospital as well. I also did look at the CT of the lumbar spine with contrast that was performed which showed no bone destruction to suggest lumbar disguises/osteomyelitis or sacral osteomyelitis. There was no paraspinal abscess. Nonetheless it appears this may have been more of just a soft tissue infection at this point which is good news. He still is nonetheless concerned about the wound which again I think is completely reasonable considering everything he's been through recently. 05/31/18 on evaluation today on evaluation today patient actually appears to be showing signs of his wound be a little bit deeper than what I would like to see. Fortunately he does not show any signs of significant  infection although his temperature was 99 today he states he's been checking this at home and has not been elevated. Nonetheless with the undermining that I'm seeing at this point I am becoming more concerned about the wound I do think that offloading is a key factor here that is preventing the speedy recovery at this point. There does not appear to be any evidence of again over infection noted. He's been using Santyl currently. CURTIES, CONIGLIARO ([id number]) 06/07/18 the patient presents today for follow-up evaluation regarding the left ulcer in the gluteal region. He has been tolerating the Wound VAC fairly well. He is obviously very frustrated with this he states that to mean is really getting in his way. There does not appear to be any evidence of infection at this time he does have a little bit of odor I do not necessarily associate this with infection just something that we sometimes notice with Wound VAC therapy. With that being said I can definitely catch a tone of discontentment overall in the patient's demeanor today. This when he was previously in the hospital an CT scan was done of the lumbar region which did not reveal any signs of osteomyelitis. With that being said the pelvis in particular was not evaluated distinctly which means he could still have some osteonecrosis I. Nonetheless the Wound VAC was started on Thursday I do want to get this little bit more time before jumping to a CT scan of the pelvis although that is something that I might would recommend if were not see an improvement by that time. 06/14/18 on evaluation today patient actually appears to be doing about the same in regard to his right gluteal ulcer. Again he did have a CT scan of  the lumbar spine unfortunately this did not include the pelvis. Nonetheless with the depth of the wound that I'm seeing today even despite the fact that I'm not seeing any evidence of overt cellulitis I believe there's a good  chance that we may be dealing with osteomyelitis somewhere in the right Ischial region. No fevers, chills, nausea, or vomiting noted at this time. 06/21/18 on evaluation today patient actually appears to be doing about the same with regard to his wound. The tunnel at 6 o'clock really does not appear to be any deeper although it is a little bit wider. I think at this point you may want to start packing this with white phone. Unfortunately I have not got approval for the CT scan of the pelvis as of yet due to the fact that Medicare apparently has been denied it due to the diagnosis codes not being appropriate according to Medicare for the test requested. With that being said the patient cannot have an MRI and therefore this is the only option that we have as far as testing is concerned. The patient has had infection and was on antibiotics and been added code for cellulitis of the bottom to see if this will be appropriate for getting the test approved. Nonetheless I'm concerned about the infection have been spread deeper into the Ischial region. 06/28/18 on evaluation today patient actually appears to be doing rather well all things considered in regard to the right gluteal ulcer. He has been tolerating the dressing changes without complication. With that being said the Wound VAC he states does have to be replaced almost every day or at least reinforced unfortunately. Patient actually has his CT scan later this morning we should have the results by tomorrow. 07/05/18 on evaluation today patient presents for follow-up concerning his right Ischial ulcer. He did see the surgeon Dr. Lysle Pearl last week. They were actually very happy with him and felt like he spent a tremendous amount of time with them as far as discussing his situation was concerned. In the end Dr. Lysle Pearl did contact me as well and determine that he would not recommend any surgical intervention at this point as he felt like it would not be in  the patient's best interest based on what he was seeing. He recommended a referral to infectious disease. Subsequently this is something that Dr. Ines Bloomer office is working on setting up for the patient. As far as evaluation today is concerned the patient's wound actually appears to be worse at this point. I am concerned about how things are progressing and specifically about infection. I do not feel like it's the deeper but the area of depth is definitely widening which does have me concerned. No fevers, chills, nausea, or vomiting noted at this time. I think that we do need initiate antibiotic therapy the patient has an allow allergy to amoxicillin/penicillin he states that he gets a rash since childhood. Nonetheless she's never had the issues with Catholics or cephalosporins in general but he is aware of. 07/27/18 on evaluation today patient presents following admission to the hospital on 07/09/18. He was subsequently discharged on 07/20/18. On 07/15/18 the patient underwent irrigation and debridement was soft tissue biopsy and bone biopsy as well as placement of a Wound VAC in the OR by Dr. Celine Ahr. During the hospital course the patient was placed on a Wound VAC and recommended follow up with surgery in three weeks actually with Dr. Delaine Lame who is infectious disease. The patient was on vancomycin during  the hospital course. 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 He also had a bone culture which revealed evidence of methicillin-resistant staph aureus. He is updated CT scan 07/09/18 reveals that he had progression of the which was performed on wound to breakdown down to the trochanter where he actually had irregularities there as well suggestive of osteomyelitis. This was a change just since 9 December when we last performed a CT scan. Obviously this one had gone downhill quite significantly and rapidly. At this point upon evaluation I feel like in general the  patient's wound seems to be doing fairly well all things considered upon my evaluation today. Obviously this is larger and deeper than what I previously evaluated but at the same time he seems to be making some progress as far as the appearance of the granulation tissue is concerned. I'm happy in that regard. No fevers, chills, nausea, or vomiting noted at this time. He is on IV vancomycin and Rocephin at the facility. He is currently in [facility]. 08/03/18 upon evaluation today patient's wound appears to be doing better in regard to the overall appearance at this point in time. Fortunately he's been tolerating the Wound VAC without complication and states that the facility has been taking [name], [name]. ([id number]) excellent care of the wound site. Overall I see some Slough noted on the surface which I am going to attempt sharp debridement today of but nonetheless other than this I feel like he's making progress. 08/09/18 on evaluation today patient's wound appears to be doing much better compared to even last week's evaluation. Do believe that the Wound VAC is been of great benefit for him. He has been tolerating the dressing changes that is the Wound VAC without any complication and he has excellent granulation noted currently. There is no need for sharp debridement at this point. 08/16/18 on evaluation today patient actually appears to be doing very well in regard to the wound in the right gluteal fold region. This is showing signs of progress and again appears to be very healthy which is excellent news. Fortunately there is no sign of active infection by way of odor or drainage at this point. Overall I'm very pleased with how things stand. He seems to be tolerating the Wound VAC without complication. 08/23/18 on evaluation today patient actually appears to be doing better in regard to his wound. He has been tolerating the Wound VAC without complication and in fact it has been  collecting a significant amount of drainage which I think is good news especially considering how the wound appears. Fortunately there is no signs of infection at this time definitely nothing appears to be worse which is good news. He has not been started on the Bactrim and Flagyl that was recommended by Dr. Delaine Lame yet. I did actually contact her office this morning in order to check and see were things are that regard their gonna be calling me back. Patient History Information obtained from Patient. Family History Hypertension - Father, Stroke - Mother, No family history of Cancer, Diabetes, Heart Disease, Kidney Disease, Lung Disease, Seizures, Thyroid Problems, Tuberculosis. Social History Never smoker, Marital Status - Married, Alcohol Use - Never, Drug Use - No History, Caffeine Use - Daily. Medical History Eyes Patient has history of Cataracts - both removed Denies history of Glaucoma, Optic Neuritis Ear/Nose/Mouth/Throat Denies history of Chronic sinus problems/congestion, Middle ear problems Hematologic/Lymphatic Denies history of Anemia, Hemophilia, Human Immunodeficiency Virus, Lymphedema Respiratory Denies history  of Aspiration, Asthma, Chronic Obstructive Pulmonary Disease (COPD), Pneumothorax, Sleep Apnea, Tuberculosis Cardiovascular Patient has history of Hypertension - takes medication Denies history of Angina, Arrhythmia, Congestive Heart Failure, Coronary Artery Disease, Deep Vein Thrombosis, Hypotension, Myocardial Infarction, Peripheral Arterial Disease, Peripheral Venous Disease, Phlebitis, Vasculitis Gastrointestinal Denies history of Cirrhosis , Colitis, Crohn s, Hepatitis A, Hepatitis B, Hepatitis C Endocrine Denies history of Type I Diabetes, Type II Diabetes Genitourinary Denies history of End Stage Renal Disease Immunological Denies history of Lupus Erythematosus, Raynaud s, Scleroderma Integumentary (Skin) Denies history of History of Burn, History  of pressure wounds Musculoskeletal Denies history of Gout, Rheumatoid Arthritis, Osteoarthritis, Osteomyelitis Neurologic Sliwa, Court J. ([id number]) Patient has history of Paraplegia - waist down Denies history of Dementia, Neuropathy, Quadriplegia, Seizure Disorder Oncologic Denies history of Received Chemotherapy, Received Radiation Psychiatric Denies history of Anorexia/bulimia, Confinement Anxiety Medical And Surgical History Notes Oncologic Prostate cancer- currently treated with horomone therapy Review of Systems (ROS) Constitutional Symptoms (General Health) Denies complaints or symptoms of Fever, Chills. Respiratory The patient has no complaints or symptoms. Cardiovascular The patient has no complaints or symptoms. Psychiatric The patient has no complaints or symptoms. Objective Constitutional Well-nourished and well-hydrated in no acute distress. Vitals Time Taken: 8:40 AM, Height: 73 in, Weight: 210 lbs, BMI: 27.7, Temperature: 98.6 F, Pulse: 86 bpm, Respiratory Rate: 16 breaths/min, Blood Pressure: 138/72 mmHg. Respiratory normal breathing without difficulty. clear to auscultation bilaterally. Cardiovascular regular rate and rhythm with normal S1, S2. Psychiatric this patient is able to make decisions and demonstrates good insight into disease process. Alert and Oriented x 3. pleasant and cooperative. General Notes: Upon inspection today patient's wound bed shows evidence of good granulation which is excellent news. There was some Slough noted which required sharp debridement today. That was tolerated by the patient without complication. Post debridement the wound bed appears to be doing much better. Integumentary (Hair, Skin) Wound #1 status is Open. Original cause of wound was Pressure Injury. The wound is located on the Right Gluteal fold. The wound measures 5.3cm length x 2.3cm width x 4cm depth; 9.574cm^2 area and 38.296cm^3 volume. There is Fat  Layer (Subcutaneous Tissue) Exposed exposed. There is no tunneling or undermining noted. There is a medium amount of serosanguineous drainage noted. The wound margin is epibole. There is large (67-100%) red, pink, hyper - granulation within the wound bed. There is a small (1-33%) amount of necrotic tissue within the wound bed including Adherent Slough. The periwound skin appearance exhibited: Rash. The periwound skin appearance did not exhibit: Callus, Crepitus, Excoriation, Vice, Faye J. ([id number]) Induration, Scarring, Dry/Scaly, Maceration, Atrophie Blanche, Cyanosis, Ecchymosis, Hemosiderin Staining, Mottled, Pallor, Rubor, Erythema. Periwound temperature was noted as No Abnormality. 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 There was a Excisional Skin/Subcutaneous Tissue Debridement with a total area of 12.19 sq cm performed by [name], HOYT E., [name]. With the following instrument(s): Curette Material removed includes Subcutaneous Tissue, Slough, and Biofilm after achieving pain control using Lidocaine. No specimens were taken. A time out was conducted at 08:56, prior to the start of the procedure. A Minimum amount of bleeding was controlled with Pressure. The procedure was tolerated well with a pain level of 0 throughout and a pain level of 0 following the procedure. Post Debridement  Measurements: 5.3cm length x 2.3cm width x 4cm depth; 38.296cm^3 volume. Post debridement Stage noted as Category/Stage IV. Character of Wound/Ulcer Post Debridement is improved. Post procedure Diagnosis Wound #1: Same as Pre-Procedure Plan Wound Cleansing: Wound #1 Right Gluteal fold: Cleanse wound  with mild soap and water Anesthetic (add to Medication List): Wound #1 Right Gluteal fold: Topical Lidocaine 4% cream applied to wound bed prior to debridement (In Clinic Only). Primary Wound Dressing: Wound #1 Right Gluteal fold: Saline moistened gauze - Continue Wound Vac Secondary Dressing: Wound #1 Right Gluteal fold: Boardered Foam Dressing Dressing Change Frequency: Wound #1 Right Gluteal fold: Change Dressing Monday, Wednesday, Friday - and as needed Follow-up Appointments: MERYL, PONDER ([id number]) Wound #1 Right Gluteal fold: Return Appointment in 1 week. Off-Loading: Wound #1 Right Gluteal fold: Mattress - Please continue air mattress at SNF Turn and reposition every 2 hours Negative Pressure Wound Therapy: Wound #1 Right Gluteal fold: Wound VAC settings at 125/130 mmHg continuous pressure. Use BLACK/GREEN foam to wound cavity. Use WHITE foam to fill any tunnel/s and/or undermining. Change VAC dressing 3 X WEEK. Change canister as indicated when full. Nurse may titrate settings and frequency of dressing changes as clinically indicated. - white foam to any tunnels Home Health Nurse may d/c VAC for s/s of increased infection, significant wound regression, or uncontrolled drainage. Blanket at [phone]. Number of foam/gauze pieces used in the dressing = Medications-please add to medication list.: Wound #1 Right Gluteal fold: Santyl Enzymatic Ointment - Please discontinue santyl under NPWT Other: - IV antibiotics, difucan PO At this time I did go ahead and make a phone call in order to help coordinate care between Dr. Delaine Lame, myself, and the nursing facility. For some reason it does not appear that the nursing facility had received the order as of yet to initiate the follow-up oral antibiotics that were discussed during last week's visit with Dr. Delaine Lame came to see the patient here in our clinic. Nonetheless that is something that  [place] patient's nurse at Dr. Gwenevere Ghazi office help record it with her during the phone call. Subsequently she did call me back as well and let me know that the that Dr. Delaine Lame was calling and Flagyl as well is Bactrim for the patient, which is good news. Subsequently we will see were things stand at follow-up. If anything changes or worsens the meantime they will contact the office let me know otherwise I'll see him for reevaluation in one week. Please see above for specific wound care orders. We will see patient for re-evaluation in 1 week(s) here in the clinic. If anything worsens or changes patient will contact our office for additional recommendations. Electronic Signature(s) Signed: 08/24/2018 8:18:44 AM By: Worthy Keeler [name] Entered By: Worthy Keeler on 08/23/2018 10:15:39 KAILYN, DUBIE ([id number]) -------------------------------------------------------------------------------- ROS/PFSH Details Patient Name: [name] Date of Service: 08/23/2018 8:30 AM Medical Record Number: [id number] Patient Account Number: [id number] Date of Birth/Sex: 08-11-1950 (67 y.o. M) Treating RN: Harold Barban Primary Care Provider: [name] Other Clinician: Referring Provider: [name] Treating Provider/Extender: [name], HOYT Weeks in Treatment: 60 Information Obtained From Patient Wound History Do you currently have one or more open woundso Yes How many open wounds do you currently haveo 1 Approximately how long have you had your woundso 1 month How have you been treating your wound(s) until nowo cream Has your wound(s) ever healed and then re-openedo No Have you had any lab work done in the past  montho No Have you tested positive for an antibiotic resistant organism (MRSA, VRE)o No Have you tested positive for osteomyelitis (bone infection)o No Have you had any tests for circulation on your legso No Constitutional Symptoms (General  Health) Complaints and Symptoms: Negative for: Fever; Chills Eyes Medical History: Positive for: Cataracts - both removed Negative for: Glaucoma; Optic Neuritis Ear/Nose/Mouth/Throat Medical History: Negative for: Chronic sinus problems/congestion; Middle ear problems Hematologic/Lymphatic Medical History: Negative for: Anemia; Hemophilia; Human Immunodeficiency Virus; Lymphedema Respiratory Complaints and Symptoms: No Complaints or Symptoms Medical History: Negative for: Aspiration; Asthma; Chronic Obstructive Pulmonary Disease (COPD); Pneumothorax; Sleep Apnea; Tuberculosis Cardiovascular Complaints and Symptoms: No Complaints or Symptoms Medical History: DELVIN, HEDEEN ([id number]) Positive for: Hypertension - takes medication Negative for: Angina; Arrhythmia; Congestive Heart Failure; Coronary Artery Disease; Deep Vein Thrombosis; Hypotension; Myocardial Infarction; Peripheral Arterial Disease; Peripheral Venous Disease; Phlebitis; Vasculitis Gastrointestinal Medical History: Negative for: Cirrhosis ; Colitis; Crohnos; Hepatitis A; Hepatitis B; Hepatitis C Endocrine Medical History: Negative for: Type I Diabetes; Type II Diabetes Genitourinary Medical History: Negative for: End Stage Renal Disease Immunological Medical History: Negative for: Lupus Erythematosus; Raynaudos; Scleroderma Integumentary (Skin) Medical History: Negative for: History of Burn; History of pressure wounds Musculoskeletal Medical History: Negative for: Gout; Rheumatoid Arthritis; Osteoarthritis; Osteomyelitis Neurologic Medical History: Positive for: Paraplegia - waist down Negative for: Dementia; Neuropathy; Quadriplegia; Seizure Disorder Oncologic Medical History: Negative for: Received Chemotherapy; Received Radiation Past Medical History Notes: Prostate cancer- currently treated with horomone therapy Psychiatric Complaints and Symptoms: No Complaints or Symptoms Medical  History: Negative for: Anorexia/bulimia; Confinement Anxiety HBO Extended History Items Eyes: Cataracts RONIN, REHFELDT. ([id number]) Immunizations Pneumococcal Vaccine: Received Pneumococcal Vaccination: No Implantable Devices Family and Social History Cancer: No; Diabetes: No; Heart Disease: No; Hypertension: Yes - Father; Kidney Disease: No; Lung Disease: No; Seizures: No; Stroke: Yes - Mother; Thyroid Problems: No; Tuberculosis: No; Never smoker; Marital Status - Married; Alcohol Use: Never; Drug Use: No History; Caffeine Use: Daily; Advanced Directives: Yes (Copy provided); Patient does not want information on Advanced Directives; Living Will: Yes (Copy provided) Physician Affirmation I have reviewed and agree with the above information. Electronic Signature(s) Signed: 08/24/2018 8:18:44 AM By: Worthy Keeler [name] Signed: 08/26/2018 11:43:02 AM By: Harold Barban Entered By: Worthy Keeler on 08/23/2018 10:13:16 RASHUN, GRATTAN ([id number]) -------------------------------------------------------------------------------- SuperBill Details Patient Name: [name] Date of Service: 08/23/2018 Medical Record Number: [id number] Patient Account Number: [id number] Date of Birth/Sex: 1950-11-11 (67 y.o. 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, sequela I10 Essential (primary) hypertension Facility Procedures CPT4 Code: 06269485 Description: 46270 - DEB SUBQ TISSUE 20 SQ CM/< ICD-10 Diagnosis Description L89.314 Pressure ulcer of right buttock, stage 4 Modifier: Quantity: 1 Physician  Procedures CPT4 Code: 3500938 Description: 18299 - WC PHYS LEVEL 4 - EST PT ICD-10 Diagnosis Description L89.314 Pressure ulcer of right buttock, stage 4 L03.317 Cellulitis of buttock G82.20 Paraplegia, unspecified S34.109S Unspecified injury to unspecified level of lumbar spinal cor Modifier: 25 d, sequela Quantity: 1 CPT4 Code: 3716967 Description: 11042 - WC PHYS SUBQ TISS 20 SQ CM ICD-10 Diagnosis Description L89.314 Pressure ulcer of right buttock, stage 4 Modifier: Quantity: 1 Electronic Signature(s) Signed: 08/24/2018 8:18:44 AM By: Worthy Keeler [name] Entered By: [name]  IIIMargarita Grizzle on 08/23/2018 10:15:56

## 2018-08-30 ENCOUNTER — Encounter: Payer: Medicare Other | Admitting: Physician Assistant

## 2018-08-30 DIAGNOSIS — E11622 Type 2 diabetes mellitus with other skin ulcer: Secondary | ICD-10-CM | POA: Diagnosis not present

## 2018-09-01 NOTE — Progress Notes (Signed)
[name], [name] ([id number]) Visit Report for 08/30/2018 Chief Complaint Document Details Patient Name: [name], [name] Date of Service: 08/30/2018 8:30 AM Medical Record Number: [id number] Patient Account Number: [id number] Date of Birth/Sex: [birth year] (68 y.o. M) Treating RN: [name] Primary Care Provider: [name] Other Clinician: Referring Provider: [name] Treating Provider/Extender: [name], HOYT Weeks in Treatment: 52 Information Obtained from: Patient Chief Complaint Right gluteal fold ulcer Electronic Signature(s) Signed: 08/31/2018 5:08:21 PM By: Worthy Keeler [name] Entered By: Worthy Keeler on 08/30/2018 08:54:36 [name], [name] ([id number]) -------------------------------------------------------------------------------- Debridement Details Patient Name: [name] Date of Service: 08/30/2018 8:30 AM Medical Record Number: [id number] Patient Account Number: [id number] Date of Birth/Sex: [birth year] (68 y.o. M) Treating RN: [name] Primary Care Provider: [name] Other Clinician: Referring Provider: [name] Treating Provider/Extender: [name], HOYT Weeks in Treatment: 39 Debridement Performed for Wound #1 Right Gluteal fold Assessment: Performed By: Physician [name], HOYT E., [name] Debridement Type: Debridement Level of Consciousness (Pre- Awake and Alert procedure): Pre-procedure Verification/Time Yes - 09:27 Out Taken: Start Time: 09:27 Pain Control: Lidocaine Total Area Debrided (L x W): 4.9 (cm) x 2.2 (cm) = 10.78 (cm) Tissue and other material Viable, Non-Viable, Muscle, Slough, Subcutaneous, Slough debrided: Level: Skin/Subcutaneous Tissue/Muscle Debridement Description: Excisional Instrument: Curette Bleeding: Moderate Hemostasis Achieved: Pressure End Time: 09:30 Procedural Pain: Insensate Response to Treatment: Procedure was tolerated well Level of Consciousness Awake and  Alert (Post-procedure): Post Debridement Measurements of Total Wound Length: (cm) 4.9 Stage: Category/Stage IV Width: (cm) 2.2 Depth: (cm) 4 Volume: (cm) 33.866 Character of Wound/Ulcer Post Stable Debridement: Post Procedure Diagnosis Same as Pre-procedure Electronic Signature(s) Signed: 08/31/2018 11:53:49 AM By: Gretta Cool, BSN, RN, CWS, Kim RN, BSN Signed: 08/31/2018 5:08:21 PM By: Worthy Keeler [name] Entered By: Gretta Cool, BSN, RN, CWS, Kim on 08/30/2018 09:28:14 [name], [name] ([id number]) -------------------------------------------------------------------------------- HPI Details Patient Name: [name] Date of Service: 08/30/2018 8:30 AM Medical Record Number: [id number] Patient Account Number: [id number] Date of Birth/Sex: [birth year] (68 y.o. 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 He states that there is no history of diabetes that he is aware of. He does have issues with his prostate and is currently receiving treatment for this by way of oral medication. With that being said I do not have a lot of details in that regard. Nonetheless the patient presents today as a result of having been referred to [place] by another provider initially home health was set to come out and take care of his wound although due to the fact that he apparently drives he's not able to receive home health. His wife is therefore trying to help take care of this wound within although they have been struggling with what exactly to do at this point. She  states that she can do some things but she is definitely not a nurse and does have some issues with looking at blood. The good news is the wound does not appear to be too deep and is fairly superficial at this point. There is no slough noted there is some nonviable skin noted around  the surface of the wound and the perimeter at this point. The central portion of the wound appears to be very good with a dermal layer noted this does not appear to be again deep enough to extend it to subcutaneous tissue at this point. 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 In the pump he has Dilaudid and baclofen. 12/07/17 on evaluation today patient presents for follow-up concerning his ongoing lower back thigh ulcer on the right. He states that he did not get the supplies ordered and therefore has not really been able to perform the dressing changes as directed exactly. His wife was able to get some Boarder Foam Dressing's from the drugstore and subsequently has been using hydrogel which did help to a degree in the wound does appear to be able smaller. There is actually more drainage this week noted than previous. 12/21/17 on evaluation today patient appears to be doing rather well in regard to his right gluteal ulcer. He has been tolerating the dressing changes without complication. There does not appear to be any evidence of infection at this point in time. Overall the wound does seem to be making some progress as far as the edges are concerned there's not as much in the way of overlapping of the external wound edges and he has a good epithelium to wound bed border for the most part. This however is not true right at the 12 o'clock location over the span of a little over a centimeters which actually will require debridement today to clean this away and hopefully allow it to continue to heal more  appropriately. 12/28/17 on evaluation today patient appears to be doing rather well in regard to his ulcer in the left gluteal region. He's been tolerating the dressing changes without complication. Apparently he has had some difficulty getting his dressing material. Apparently there's been some confusion with ordering we're gonna check into this. Nonetheless overall he's been showing signs of improvement which is good news. Debridement is not required today. 01/04/18 on evaluation today patient presents for follow-up concerning his right gluteal ulcer. He has been tolerating the dressing changes fairly well. On inspection today it appears he may actually have some maceration them concerned about the fact that he may be developing too much moisture in and around the wound bed which can cause delay in healing. With that being said he unfortunately really has not showed significant signs of improvement since last week's evaluation in fact this may even be just the little bit/slightly larger. Nonetheless he's been having a lot of discomfort I'm not sure this is even related to the wound as he has no pain when I'm to breeding or otherwise cleaning the wound during evaluation today. Nonetheless this is something that we did recommend he talked to his pain specialist concerning. 01/11/18 on evaluation today patient appears to be doing better in regard to his ulceration. He has been tolerating the dressing changes without complication. With that being said overall there's no evidence of infection which is good news. The only thing is he did receive the hatch affair blue classic versus the ready nonetheless I feel like this is perfectly fine and appears to have done well for him over the past week. 01/25/18 on evaluation today patient's wound actually appears to be a little bit larger than during the last evaluation. The good [name], [name] ([id number]) news is the  majority of the wound edges actually  appear to be fairly firmly attached to the wound bed unfortunately again we're not really making progress in regard to the size. Roughly the wound is about the same size as when I first saw him although again the wound margin/edges appear to be much better. 02/01/18 on evaluation today patient actually appears to be doing very well in regard to his wound. Applying the Prisma dry does seem to be better although he does still have issues with slow progression of the wound. There was a slight improvement compared to last week's measurements today. Nonetheless I have been considering other options as far as the possibility of Theraskin or even a snap vac. In general I'm not sure that the Theraskin due to location of the wound would be a very good idea. Nonetheless I do think that a snap vac could be a possibility for the patient and in fact I think this could even be an excellent way to manage the wound possibly seeing some improvement in a very rapid fashion here. Nonetheless this is something that we would need to get approved and I did have a lengthy conversation with the patient about this today. 02/08/18 on evaluation today patient appears to be doing a little better in regard to his ulcer. He has been tolerating the dressing changes without complication. Fortunately despite the fact that the wound is a little bit smaller it's not significantly so unfortunately. We have discussed the possibility of a snap vac we did check with insurance this is actually covered at this point. Fortunately there does not appear to be any sign of infection. Overall I'm fairly pleased with how things seem to be appearing at this point. 02/15/18 on evaluation today patient appears to be doing rather well in regard to his right gluteal ulcer. Unfortunately the snap vac did not stay in place with his sheer and friction this came loose and did not seem to maintain seal very well. He worked for about two days and it did seem  to do very well during that time according to his wife but in general this does not seem to be something that's gonna be beneficial for him long-term. I do believe we need to go back to standard dressings to see if we can find something that will be of benefit. 03/02/18- He is here in follow up evaluation; there is minimal change in the wound. He will continue with the same treatment plan, would consider changing to iodosrob/iodoflex if ulcer continues to to plateau. He will follow up next week 03/08/18 on evaluation today patient's wound actually appears to be about the same size as when I previously saw him several weeks back. Unfortunately he does have some slightly dark discoloration in the central portion of the wound which has me concerned about pressure injury. I do believe he may be sitting for too long a period of time in fact he tells me that "I probably sit for much too long". He does have some Slough noted on the surface of the wound and again as far as the size of the wound is concerned I'm really not seeing anything that seems to have improved significantly. 03/15/18 on evaluation today patient appears to be doing fairly well in regard to his ulcer. The wound measured pretty much about the same today compared to last week's evaluation when looking at his graph. With that being said the area of bruising/deep tissue injury that was noted last week I do  not see at this point. He did get a new cushion fortunately this does seem to be have been of benefit in my pinion. It does appear that he's been off of this more which is good news as well I think that is definitely showing in the overall wound measurements. With that being said I do believe that he needs to continue to offload I don't think that the fact this is doing better should be or is going to allow him to not have to offload and explain this to him as well. Overall he seems to be in agreement the plan I think he understands. The  overall appearance of the wound bed is improved compared to last week I think the Iodoflex has been beneficial in that regard. 03/29/18 on evaluation today patient actually appears to be doing rather well in regard to his wound from the overall appearance standpoint he does have some granulation although there's some Slough on the surface of the wound noted as well. With that being said he unfortunately has not improved in regard to the overall measurement of the wound in volume or in size. I did have a discussion with him very specifically about offloading today. He actually does work although he mainly is just sitting throughout the day. He tells me he offloads by "lifting himself up for 30 seconds off of his chair occasionally" purchase from [employer] which does seem to have helped. And he has a new cushion that he with that being said he's also able to stand some for a very short period of time but not significant enough I think to provide appropriate offloading. I think the biggest issue at this point with the wound and the fact is not healing as quickly as we would like is due to the fact that he is really not able to appropriately offload while at work. He states the beginning after his injury he actually had a bed at his job that he could lay on in order to offload and that does seem to have been of help back at that time. Nonetheless he had not done this in quite some time unfortunately. I think that could be helpful for him this is something I would like for him to look into. 04/05/18 on evaluation today patient actually presents for follow-up concerning his right gluteal ulcer. Again he really is not significantly improved even compared to last week. He has been tolerating the dressing changes without complication. With that being said fortunately there appears to be no evidence of infection at this time. He has been more proactive in trying to offload. [name], [name]  ([id number]) 04/12/18 on evaluation today patient actually appears to be doing a little better in regard to his wound and the right gluteal fold region. He's been tolerating the dressing changes since removing the oasis without complication. However he was having a lot of burning initially with the oasis in place. He's unsure of exactly why this was given so much discomfort but he assumes that it was the oasis itself causing the problem. Nonetheless this had to be removed after about three days in place although even those three days seem to have made a fairly good improvement in regard to the overall appearance of the wound bed. In fact is the first time that he's made any improvement from the standpoint of measurements in about six weeks. He continues to have no discomfort over the area of the wound itself which leads me to wonder  why he was having the burning with the oasis when he does not even feel the actual debridement's themselves. I am somewhat perplexed by this. 04/19/18 on evaluation today patient's wound actually appears to be showing signs of epithelialization around the edge of the wound and in general actually appears to be doing better which is good news. He did have the same burning after about three days with applying the Endoform last week in the same fashion that I would generally apply a skin substitute. This seems to indicate that it's not the oasis to cause the problem but potentially the moisture buildup that just causes things to burn or there may be some other reaction with the skin prep or Steri-Strips. Nonetheless I'm not sure that is gonna be able to tolerate any skin substitute for a long period of time. The good news is the wound actually appears to be doing better today compared to last week and does seem to finally be making some progress. 04/26/18 on evaluation today patient actually appears to be doing rather well in regard to his ulcer in the right gluteal fold.  He has been tolerating the dressing changes without complication which is good news. The Endoform does seem to be helping although he was a little bit more macerated this week. This seems to be an ongoing issue with fluid control at this point. Nonetheless I think we may be able to add something like Drawtex to help control the drainage. 05/03/18 on evaluation today patient appears to actually be doing better in regard to the overall appearance of his wound. He has been tolerating the dressing changes without complication. Fortunately there appears to be no evidence of infection at this time. I really feel like his wound has shown signs as of today of turning around last week I thought so as well and definitely he could be seen in this week's overall appearance and measurements. In general I'm very pleased with the fact that he finally seems to be making a steady but sure progress. The patient likewise is very pleased. 05/17/18 on evaluation today patient appears to be doing more poorly unfortunately in regard to his ulcer. He has been tolerating the dressing changes without complication. With that being said he tells me that in the past couple of days he and his wife have noticed that we did not seem to be doing quite as well is getting dark near the center. Subsequently upon evaluation today the wound actually does appear to be doing worse compared to previous. He has been tolerating the dressing changes otherwise and he states that he is not been sitting up anymore than he was in the past from what he tells me. Still he has continued to work he states "I'm tired of dealing with this and if I have to just go home and lay in the bed all the time that's what I'll do". Nonetheless I am concerned about the fact that this wound does appear to be deeper than what it was previous. 05/24/18 upon evaluation today patient actually presents after having been in the hospital due to what was presumed to  be sepsis secondary to the wound infection. He had an elevated white blood cell count between 14 and 15. With that being said he does seem to be doing somewhat better now. His wound still is giving him some trouble nonetheless and he is obviously concerned about the fact likely talked about that this does seem to go more deeply than previously noted.  I did review his wound culture which showed evidence of Staphylococcus aureus him and group B strep. Nonetheless he is on antibiotics, Levaquin, for this. Subsequently I did review his intake summary from the hospital as well. I also did look at the CT of the lumbar spine with contrast that was performed which showed no bone destruction to suggest lumbar disguises/osteomyelitis or sacral osteomyelitis. There was no paraspinal abscess. Nonetheless it appears this may have been more of just a soft tissue infection at this point which is good news. He still is nonetheless concerned about the wound which again I think is completely reasonable considering everything he's been through recently. 05/31/18 on evaluation today on evaluation today patient actually appears to be showing signs of his wound be a little bit deeper than what I would like to see. Fortunately he does not show any signs of significant infection although his temperature was 99 today he states he's been checking this at home and has not been elevated. Nonetheless with the undermining that I'm seeing at this point I am becoming more concerned about the wound I do think that offloading is a key factor here that is preventing the speedy recovery at this point. There does not appear to be any evidence of again over infection noted. He's been using Santyl currently. 06/07/18 the patient presents today for follow-up evaluation regarding the left ulcer in the gluteal region. He has been tolerating the Wound VAC fairly well. He is obviously very frustrated with this he states that to mean is really  getting in his way. There does not appear to be any evidence of infection at this time he does have a little bit of odor I do not necessarily associate this with infection just something that we sometimes notice with Wound VAC therapy. With that being said I can definitely catch a tone of discontentment overall in the patient's demeanor today. This when he was previously in the hospital [name], [name]. ([id number]) an CT scan was done of the lumbar region which did not reveal any signs of osteomyelitis. With that being said the pelvis in particular was not evaluated distinctly which means he could still have some osteonecrosis I. Nonetheless the Wound VAC was started on Thursday I do want to get this little bit more time before jumping to a CT scan of the pelvis although that is something that I might would recommend if were not see an improvement by that time. 06/14/18 on evaluation today patient actually appears to be doing about the same in regard to his right gluteal ulcer. Again he did have a CT scan of the lumbar spine unfortunately this did not include the pelvis. Nonetheless with the depth of the wound that I'm seeing today even despite the fact that I'm not seeing any evidence of overt cellulitis I believe there's a good chance that we may be dealing with osteomyelitis somewhere in the right Ischial region. No fevers, chills, nausea, or vomiting noted at this time. 06/21/18 on evaluation today patient actually appears to be doing about the same with regard to his wound. The tunnel at 6 o'clock really does not appear to be any deeper although it is a little bit wider. I think at this point you may want to start packing this with white phone. Unfortunately I have not got approval for the CT scan of the pelvis as of yet due to the fact that Medicare apparently has been denied it due to the diagnosis codes not being appropriate  according to Medicare for the test requested. With that being  said the patient cannot have an MRI and therefore this is the only option that we have as far as testing is concerned. The patient has had infection and was on antibiotics and been added code for cellulitis of the bottom to see if this will be appropriate for getting the test approved. Nonetheless I'm concerned about the infection have been spread deeper into the Ischial region. 06/28/18 on evaluation today patient actually appears to be doing rather well all things considered in regard to the right gluteal ulcer. He has been tolerating the dressing changes without complication. With that being said the Wound VAC he states does have to be replaced almost every day or at least reinforced unfortunately. Patient actually has his CT scan later this morning we should have the results by tomorrow. 07/05/18 on evaluation today patient presents for follow-up concerning his right Ischial ulcer. He did see the surgeon Dr. Lysle Pearl last week. They were actually very happy with him and felt like he spent a tremendous amount of time with them as far as discussing his situation was concerned. In the end Dr. Lysle Pearl did contact me as well and determine that he would not recommend any surgical intervention at this point as he felt like it would not be in the patient's best interest based on what he was seeing. He recommended a referral to infectious disease. Subsequently this is something that Dr. Ines Bloomer office is working on setting up for the patient. As far as evaluation today is concerned the patient's wound actually appears to be worse at this point. I am concerned about how things are progressing and specifically about infection. I do not feel like it's the deeper but the area of depth is definitely widening which does have me concerned. No fevers, chills, nausea, or vomiting noted at this time. I think that we do need initiate antibiotic therapy the patient has an allow allergy to amoxicillin/penicillin he states  that he gets a rash since childhood. Nonetheless she's never had the issues with Catholics or cephalosporins in general but he is aware of. 07/27/18 on evaluation today patient presents following admission to the hospital on 07/09/18. He was subsequently discharged on 07/20/18. On 07/15/18 the patient underwent irrigation and debridement was soft tissue biopsy and bone biopsy as well as placement of a Wound VAC in the OR by Dr. Celine Ahr. During the hospital course the patient was placed on a Wound VAC and recommended follow up with surgery in three weeks actually with Dr. Delaine Lame who is infectious disease. The patient was on vancomycin during the hospital course. 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 He also had a bone culture which revealed evidence of methicillin-resistant staph aureus. He is updated CT scan 07/09/18 reveals that he had progression of the which was performed on wound to breakdown down to the trochanter where he actually had irregularities there as well suggestive of osteomyelitis. This was a change just since 9 December when we last performed a CT scan. Obviously this one had gone downhill quite significantly and rapidly. At this point upon evaluation I feel like in general the patient's wound seems to be doing fairly well all things considered upon my evaluation today. Obviously this is larger and deeper than what I previously evaluated but at the same time he seems to be making some progress as far as the appearance of the granulation tissue is concerned. I'm happy in  that regard. No fevers, chills, nausea, or vomiting noted at this time. He is on IV vancomycin and Rocephin at the facility. He is currently in [facility]. 08/03/18 upon evaluation today patient's wound appears to be doing better in regard to the overall appearance at this point in time. Fortunately he's been tolerating the Wound VAC without complication and states that  the facility has been taking excellent care of the wound site. Overall I see some Slough noted on the surface which I am going to attempt sharp debridement today of but nonetheless other than this I feel like he's making progress. 08/09/18 on evaluation today patient's wound appears to be doing much better compared to even last week's evaluation. Do believe that the Wound VAC is been of great benefit for him. He has been tolerating the dressing changes that is the Wound MAYAN, KLOEPFER. ([id number]) VAC without any complication and he has excellent granulation noted currently. There is no need for sharp debridement at this point. 08/16/18 on evaluation today patient actually appears to be doing very well in regard to the wound in the right gluteal fold region. This is showing signs of progress and again appears to be very healthy which is excellent news. Fortunately there is no sign of active infection by way of odor or drainage at this point. Overall I'm very pleased with how things stand. He seems to be tolerating the Wound VAC without complication. 08/23/18 on evaluation today patient actually appears to be doing better in regard to his wound. He has been tolerating the Wound VAC without complication and in fact it has been collecting a significant amount of drainage which I think is good news especially considering how the wound appears. Fortunately there is no signs of infection at this time definitely nothing appears to be worse which is good news. He has not been started on the Bactrim and Flagyl that was recommended by Dr. Delaine Lame yet. I did actually contact her office this morning in order to check and see were things are that regard their gonna be calling me back. 08/30/18 on evaluation today patient actually appears to show signs of excellent improvement today compared to last evaluation. The undermining is getting much better the wound seems to be feeling quite nicely and I'm very  pleased that the granulation in general. With that being said overall I feel like the patient has made excellent progress which is great news. No fevers, chills, nausea, or vomiting noted at this time. Electronic Signature(s) Signed: 08/31/2018 5:08:21 PM By: Worthy Keeler [name] Entered By: Worthy Keeler on 08/30/2018 09:50:13 KAIMANI, CLAYSON ([id number]) -------------------------------------------------------------------------------- Physical Exam Details Patient Name: [name] Date of Service: 08/30/2018 8:30 AM Medical Record Number: [id number] Patient Account Number: [id number] Date of Birth/Sex: 1950-08-04 (68 y.o. M) Treating RN: [name] Primary Care Provider: [name] Other Clinician: Referring Provider: [name] Treating Provider/Extender: [name], HOYT Weeks in Treatment: 88 Constitutional Well-nourished and well-hydrated in no acute distress. Respiratory normal breathing without difficulty. Psychiatric this patient is able to make decisions and demonstrates good insight into disease process. Alert and Oriented x 3. pleasant and cooperative. Notes Patient's wound bed currently shows evidence of good granulation. He he has been tolerating the dressing changes without complication. Sharp debridement was performed today to clear away the slough from the surface of the wound and help improve the overall quality of the wound bed. I think that the Wound VAC is still an appropriate way to go as  far as managing this wound currently. Electronic Signature(s) Signed: 08/31/2018 5:08:21 PM By: Worthy Keeler [name] Entered By: Worthy Keeler on 08/30/2018 09:50:50 [name], [name] ([id number]) -------------------------------------------------------------------------------- Physician Orders Details Patient Name: [name] Date of Service: 08/30/2018 8:30 AM Medical Record Number: [id number] Patient Account Number: [id number] Date of  Birth/Sex: [birth year] (68 y.o. 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 Primary Wound Dressing Wound #1 Right Gluteal fold o Silver Alginate - in clinic until SNF restarts NPWT Secondary Dressing Wound #1 Right Gluteal fold o Boardered Foam Dressing Dressing Change Frequency Wound #1 Right Gluteal fold o Change Dressing Monday, Wednesday, Friday - and as needed Follow-up Appointments Wound #1 Right Gluteal fold o Return Appointment in 1 week. Off-Loading Wound #1 Right Gluteal fold o Mattress - Please continue air mattress at SNF o Turn and reposition every 2 hours Negative Pressure Wound Therapy Wound #1 Right Gluteal fold [name], [name] ([id number]) o Wound VAC settings at 125/130 mmHg continuous pressure. Use BLACK/GREEN foam to wound cavity. Use WHITE foam to fill any tunnel/s and/or undermining. Change VAC dressing 3 X WEEK. Change canister as indicated when full. Nurse may titrate settings and frequency of dressing changes as clinically indicated. - white foam to any tunnels o Home Health Nurse may d/c VAC for s/s of increased infection, significant wound regression, or uncontrolled drainage. Eunice at [phone]. o Number of foam/gauze pieces  used in the dressing = Medications-please add to medication list. Wound #1 Right Gluteal fold o Other: - IV antibiotics, difucan PO Electronic Signature(s) Signed: 08/31/2018 11:53:49 AM By: Gretta Cool, BSN, RN, CWS, Kim RN, BSN Signed: 08/31/2018 5:08:21 PM By: Worthy Keeler [name] Entered By: Gretta Cool, BSN, RN, CWS, Kim on 08/30/2018 09:31:40 [name], [name] ([id number]) -------------------------------------------------------------------------------- Problem List Details Patient Name: [name] Date of Service: 08/30/2018 8:30 AM Medical Record Number: [id number] Patient Account Number: [id number] Date of Birth/Sex: 1951-03-26 (68 y.o. M) Treating RN: [name] Primary Care Provider: [name] Other Clinician: Referring Provider: [name] Treating Provider/Extender: [name], HOYT Weeks in Treatment: 76 Active Problems ICD-10 Evaluated Encounter Code Description Active Date Today Diagnosis L89.314 Pressure ulcer of right buttock, stage 4 11/30/2017 No Yes L03.317 Cellulitis of buttock 06/21/2018 No Yes G82.20 Paraplegia, unspecified 11/30/2017 No Yes S34.109S Unspecified injury to unspecified level of lumbar spinal cord, 11/30/2017 No Yes sequela I10 Essential (primary) hypertension 11/30/2017 No Yes Inactive Problems Resolved Problems Electronic Signature(s) Signed: 08/31/2018 5:08:21 PM By: Worthy Keeler [name] Entered By: Worthy Keeler on 08/30/2018 08:54:31 [name], [name] Mate ([id number]) -------------------------------------------------------------------------------- Progress Note Details Patient Name: [name] Date of Service: 08/30/2018 8:30 AM Medical Record Number: [id number] Patient Account Number: [id number] Date of Birth/Sex: Mar 08, 1951 (68 y.o. 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 He states that there is no history of diabetes that he is aware of. He does have issues with his prostate and is currently receiving treatment for this by way of oral medication. With that being said I do not have a lot of details in that regard. Nonetheless the patient presents today as a result of having been referred to [place] by another provider initially home health was set to come out and take care of his wound although due to the fact that he apparently drives he's not able to receive home health. His wife is therefore trying to help take care of this wound within although they have been struggling with what exactly to do at this point. She states that she can do some things but she is definitely not a nurse and does have some issues with looking at blood. The good news is the wound does not appear to be too deep and is fairly superficial at this point. There is no slough noted there is some nonviable skin noted around the surface of the wound and the perimeter at this point. The central portion of the wound appears to be very good with a dermal layer noted this does not appear to be again deep enough to extend it to subcutaneous tissue at this point. 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 In the pump he has Dilaudid and baclofen. 12/07/17 on evaluation today patient presents for follow-up concerning his ongoing lower back thigh ulcer on the right. He states that he did not get the supplies ordered and therefore  has not really been able to perform the dressing changes as directed exactly. His wife was able to get some Boarder Foam Dressing's from the drugstore and subsequently has been using hydrogel which did help to a degree in the wound does appear to be able smaller. There is actually more drainage this week noted than previous. 12/21/17 on evaluation today patient appears to be doing rather well in regard to his right gluteal ulcer. He has been tolerating the dressing changes without complication. There does not appear to be any evidence of infection at this point in time. Overall the wound does seem to be making some progress as far as the edges are concerned there's not as much in the way of overlapping of the external wound edges and he has a good epithelium to wound bed border for the most part. This however is not true right at the 12 o'clock location over the span of a little over a centimeters which actually will require debridement today to clean this away and hopefully allow it to continue to heal more appropriately. 12/28/17 on evaluation today patient appears to be doing rather well in regard to his ulcer in the left gluteal region. He's been tolerating the dressing changes without complication. Apparently he has had some difficulty getting his dressing material. Apparently there's been some confusion with ordering we're gonna check into this. Nonetheless overall he's been showing signs of improvement which is good news. Debridement is not required today. 01/04/18 on evaluation today patient  presents for follow-up concerning his right gluteal ulcer. He has been tolerating the dressing changes fairly well. On inspection today it appears he may actually have some maceration them concerned about the fact that he may be developing too much moisture in and around the wound bed which can cause delay in healing. With that being said he unfortunately really has not showed significant signs of  improvement since last week's evaluation in fact this may even be just the little bit/slightly larger. Nonetheless he's been having a lot of discomfort I'm not sure this is even related to the wound as he has no pain when I'm to breeding or otherwise cleaning the wound during evaluation today. Nonetheless this is something that we did recommend he talked to his pain specialist concerning. 01/11/18 on evaluation today patient appears to be doing better in regard to his ulceration. He has been tolerating the dressing Loeza, Mickle J. ([id number]) changes without complication. With that being said overall there's no evidence of infection which is good news. The only thing is he did receive the hatch affair blue classic versus the ready nonetheless I feel like this is perfectly fine and appears to have done well for him over the past week. 01/25/18 on evaluation today patient's wound actually appears to be a little bit larger than during the last evaluation. The good news is the majority of the wound edges actually appear to be fairly firmly attached to the wound bed unfortunately again we're not really making progress in regard to the size. Roughly the wound is about the same size as when I first saw him although again the wound margin/edges appear to be much better. 02/01/18 on evaluation today patient actually appears to be doing very well in regard to his wound. Applying the Prisma dry does seem to be better although he does still have issues with slow progression of the wound. There was a slight improvement compared to last week's measurements today. Nonetheless I have been considering other options as far as the possibility of Theraskin or even a snap vac. In general I'm not sure that the Theraskin due to location of the wound would be a very good idea. Nonetheless I do think that a snap vac could be a possibility for the patient and in fact I think this could even be an excellent way to manage  the wound possibly seeing some improvement in a very rapid fashion here. Nonetheless this is something that we would need to get approved and I did have a lengthy conversation with the patient about this today. 02/08/18 on evaluation today patient appears to be doing a little better in regard to his ulcer. He has been tolerating the dressing changes without complication. Fortunately despite the fact that the wound is a little bit smaller it's not significantly so unfortunately. We have discussed the possibility of a snap vac we did check with insurance this is actually covered at this point. Fortunately there does not appear to be any sign of infection. Overall I'm fairly pleased with how things seem to be appearing at this point. 02/15/18 on evaluation today patient appears to be doing rather well in regard to his right gluteal ulcer. Unfortunately the snap vac did not stay in place with his sheer and friction this came loose and did not seem to maintain seal very well. He worked for about two days and it did seem to do very well during that time according to his wife but in general this does not  seem to be something that's gonna be beneficial for him long-term. I do believe we need to go back to standard dressings to see if we can find something that will be of benefit. 03/02/18- He is here in follow up evaluation; there is minimal change in the wound. He will continue with the same treatment plan, would consider changing to iodosrob/iodoflex if ulcer continues to to plateau. He will follow up next week 03/08/18 on evaluation today patient's wound actually appears to be about the same size as when I previously saw him several weeks back. Unfortunately he does have some slightly dark discoloration in the central portion of the wound which has me concerned about pressure injury. I do believe he may be sitting for too long a period of time in fact he tells me that "I probably sit for much too long". He  does have some Slough noted on the surface of the wound and again as far as the size of the wound is concerned I'm really not seeing anything that seems to have improved significantly. 03/15/18 on evaluation today patient appears to be doing fairly well in regard to his ulcer. The wound measured pretty much about the same today compared to last week's evaluation when looking at his graph. With that being said the area of bruising/deep tissue injury that was noted last week I do not see at this point. He did get a new cushion fortunately this does seem to be have been of benefit in my pinion. It does appear that he's been off of this more which is good news as well I think that is definitely showing in the overall wound measurements. With that being said I do believe that he needs to continue to offload I don't think that the fact this is doing better should be or is going to allow him to not have to offload and explain this to him as well. Overall he seems to be in agreement the plan I think he understands. The overall appearance of the wound bed is improved compared to last week I think the Iodoflex has been beneficial in that regard. 03/29/18 on evaluation today patient actually appears to be doing rather well in regard to his wound from the overall appearance standpoint he does have some granulation although there's some Slough on the surface of the wound noted as well. With that being said he unfortunately has not improved in regard to the overall measurement of the wound in volume or in size. I did have a discussion with him very specifically about offloading today. He actually does work although he mainly is just sitting throughout the day. He tells me he offloads by "lifting himself up for 30 seconds off of his chair occasionally" purchase from [employer] which does seem to have helped. And he has a new cushion that he with that being said he's also able to stand some for a very short  period of time but not significant enough I think to provide appropriate offloading. I think the biggest issue at this point with the wound and the fact is not healing as quickly as we would like is due to the fact that he is really not able to appropriately offload while at work. He states the beginning after his injury he actually had a bed at his job that he could lay on in order to offload and that does seem to have been of help back at that time. Nonetheless he had not done this in  quite some time unfortunately. I think that could be helpful for him this is something I would like for him to look into. MARKY, BURESH ([id number]) 04/05/18 on evaluation today patient actually presents for follow-up concerning his right gluteal ulcer. Again he really is not significantly improved even compared to last week. He has been tolerating the dressing changes without complication. With that being said fortunately there appears to be no evidence of infection at this time. He has been more proactive in trying to offload. 04/12/18 on evaluation today patient actually appears to be doing a little better in regard to his wound and the right gluteal fold region. He's been tolerating the dressing changes since removing the oasis without complication. However he was having a lot of burning initially with the oasis in place. He's unsure of exactly why this was given so much discomfort but he assumes that it was the oasis itself causing the problem. Nonetheless this had to be removed after about three days in place although even those three days seem to have made a fairly good improvement in regard to the overall appearance of the wound bed. In fact is the first time that he's made any improvement from the standpoint of measurements in about six weeks. He continues to have no discomfort over the area of the wound itself which leads me to wonder why he was having the burning with the oasis when he does not even  feel the actual debridement's themselves. I am somewhat perplexed by this. 04/19/18 on evaluation today patient's wound actually appears to be showing signs of epithelialization around the edge of the wound and in general actually appears to be doing better which is good news. He did have the same burning after about three days with applying the Endoform last week in the same fashion that I would generally apply a skin substitute. This seems to indicate that it's not the oasis to cause the problem but potentially the moisture buildup that just causes things to burn or there may be some other reaction with the skin prep or Steri-Strips. Nonetheless I'm not sure that is gonna be able to tolerate any skin substitute for a long period of time. The good news is the wound actually appears to be doing better today compared to last week and does seem to finally be making some progress. 04/26/18 on evaluation today patient actually appears to be doing rather well in regard to his ulcer in the right gluteal fold. He has been tolerating the dressing changes without complication which is good news. The Endoform does seem to be helping although he was a little bit more macerated this week. This seems to be an ongoing issue with fluid control at this point. Nonetheless I think we may be able to add something like Drawtex to help control the drainage. 05/03/18 on evaluation today patient appears to actually be doing better in regard to the overall appearance of his wound. He has been tolerating the dressing changes without complication. Fortunately there appears to be no evidence of infection at this time. I really feel like his wound has shown signs as of today of turning around last week I thought so as well and definitely he could be seen in this week's overall appearance and measurements. In general I'm very pleased with the fact that he finally seems to be making a steady but sure progress. The patient likewise  is very pleased. 05/17/18 on evaluation today patient appears to be doing more poorly unfortunately  in regard to his ulcer. He has been tolerating the dressing changes without complication. With that being said he tells me that in the past couple of days he and his wife have noticed that we did not seem to be doing quite as well is getting dark near the center. Subsequently upon evaluation today the wound actually does appear to be doing worse compared to previous. He has been tolerating the dressing changes otherwise and he states that he is not been sitting up anymore than he was in the past from what he tells me. Still he has continued to work he states "I'm tired of dealing with this and if I have to just go home and lay in the bed all the time that's what I'll do". Nonetheless I am concerned about the fact that this wound does appear to be deeper than what it was previous. 05/24/18 upon evaluation today patient actually presents after having been in the hospital due to what was presumed to be sepsis secondary to the wound infection. He had an elevated white blood cell count between 14 and 15. With that being said he does seem to be doing somewhat better now. His wound still is giving him some trouble nonetheless and he is obviously concerned about the fact likely talked about that this does seem to go more deeply than previously noted. I did review his wound culture which showed evidence of Staphylococcus aureus him and group B strep. Nonetheless he is on antibiotics, Levaquin, for this. Subsequently I did review his intake summary from the hospital as well. I also did look at the CT of the lumbar spine with contrast that was performed which showed no bone destruction to suggest lumbar disguises/osteomyelitis or sacral osteomyelitis. There was no paraspinal abscess. Nonetheless it appears this may have been more of just a soft tissue infection at this point which is good news. He still is  nonetheless concerned about the wound which again I think is completely reasonable considering everything he's been through recently. 05/31/18 on evaluation today on evaluation today patient actually appears to be showing signs of his wound be a little bit deeper than what I would like to see. Fortunately he does not show any signs of significant infection although his temperature was 99 today he states he's been checking this at home and has not been elevated. Nonetheless with the undermining that I'm seeing at this point I am becoming more concerned about the wound I do think that offloading is a key factor here that is preventing the speedy recovery at this point. There does not appear to be any evidence of again over infection noted. He's been using Santyl currently. AXZEL, ROCKHILL ([id number]) 06/07/18 the patient presents today for follow-up evaluation regarding the left ulcer in the gluteal region. He has been tolerating the Wound VAC fairly well. He is obviously very frustrated with this he states that to mean is really getting in his way. There does not appear to be any evidence of infection at this time he does have a little bit of odor I do not necessarily associate this with infection just something that we sometimes notice with Wound VAC therapy. With that being said I can definitely catch a tone of discontentment overall in the patient's demeanor today. This when he was previously in the hospital an CT scan was done of the lumbar region which did not reveal any signs of osteomyelitis. With that being said the pelvis in particular was not evaluated distinctly  which means he could still have some osteonecrosis I. Nonetheless the Wound VAC was started on Thursday I do want to get this little bit more time before jumping to a CT scan of the pelvis although that is something that I might would recommend if were not see an improvement by that time. 06/14/18 on evaluation today patient  actually appears to be doing about the same in regard to his right gluteal ulcer. Again he did have a CT scan of the lumbar spine unfortunately this did not include the pelvis. Nonetheless with the depth of the wound that I'm seeing today even despite the fact that I'm not seeing any evidence of overt cellulitis I believe there's a good chance that we may be dealing with osteomyelitis somewhere in the right Ischial region. No fevers, chills, nausea, or vomiting noted at this time. 06/21/18 on evaluation today patient actually appears to be doing about the same with regard to his wound. The tunnel at 6 o'clock really does not appear to be any deeper although it is a little bit wider. I think at this point you may want to start packing this with white phone. Unfortunately I have not got approval for the CT scan of the pelvis as of yet due to the fact that Medicare apparently has been denied it due to the diagnosis codes not being appropriate according to Medicare for the test requested. With that being said the patient cannot have an MRI and therefore this is the only option that we have as far as testing is concerned. The patient has had infection and was on antibiotics and been added code for cellulitis of the bottom to see if this will be appropriate for getting the test approved. Nonetheless I'm concerned about the infection have been spread deeper into the Ischial region. 06/28/18 on evaluation today patient actually appears to be doing rather well all things considered in regard to the right gluteal ulcer. He has been tolerating the dressing changes without complication. With that being said the Wound VAC he states does have to be replaced almost every day or at least reinforced unfortunately. Patient actually has his CT scan later this morning we should have the results by tomorrow. 07/05/18 on evaluation today patient presents for follow-up concerning his right Ischial ulcer. He did see the  surgeon Dr. Lysle Pearl last week. They were actually very happy with him and felt like he spent a tremendous amount of time with them as far as discussing his situation was concerned. In the end Dr. Lysle Pearl did contact me as well and determine that he would not recommend any surgical intervention at this point as he felt like it would not be in the patient's best interest based on what he was seeing. He recommended a referral to infectious disease. Subsequently this is something that Dr. Ines Bloomer office is working on setting up for the patient. As far as evaluation today is concerned the patient's wound actually appears to be worse at this point. I am concerned about how things are progressing and specifically about infection. I do not feel like it's the deeper but the area of depth is definitely widening which does have me concerned. No fevers, chills, nausea, or vomiting noted at this time. I think that we do need initiate antibiotic therapy the patient has an allow allergy to amoxicillin/penicillin he states that he gets a rash since childhood. Nonetheless she's never had the issues with Catholics or cephalosporins in general but he is aware of. 07/27/18  on evaluation today patient presents following admission to the hospital on 07/09/18. He was subsequently discharged on 07/20/18. On 07/15/18 the patient underwent irrigation and debridement was soft tissue biopsy and bone biopsy as well as placement of a Wound VAC in the OR by Dr. Celine Ahr. During the hospital course the patient was placed on a Wound VAC and recommended follow up with surgery in three weeks actually with Dr. Delaine Lame who is infectious disease. The patient was on vancomycin during the hospital course. 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 He also had a bone culture which revealed evidence of methicillin-resistant staph aureus. He is updated CT scan 07/09/18 reveals that he had progression of the which  was performed on wound to breakdown down to the trochanter where he actually had irregularities there as well suggestive of osteomyelitis. This was a change just since 9 December when we last performed a CT scan. Obviously this one had gone downhill quite significantly and rapidly. At this point upon evaluation I feel like in general the patient's wound seems to be doing fairly well all things considered upon my evaluation today. Obviously this is larger and deeper than what I previously evaluated but at the same time he seems to be making some progress as far as the appearance of the granulation tissue is concerned. I'm happy in that regard. No fevers, chills, nausea, or vomiting noted at this time. He is on IV vancomycin and Rocephin at the facility. He is currently in [facility]. 08/03/18 upon evaluation today patient's wound appears to be doing better in regard to the overall appearance at this point in time. Fortunately he's been tolerating the Wound VAC without complication and states that the facility has been taking OMARI, MCMANAWAY. ([id number]) excellent care of the wound site. Overall I see some Slough noted on the surface which I am going to attempt sharp debridement today of but nonetheless other than this I feel like he's making progress. 08/09/18 on evaluation today patient's wound appears to be doing much better compared to even last week's evaluation. Do believe that the Wound VAC is been of great benefit for him. He has been tolerating the dressing changes that is the Wound VAC without any complication and he has excellent granulation noted currently. There is no need for sharp debridement at this point. 08/16/18 on evaluation today patient actually appears to be doing very well in regard to the wound in the right gluteal fold region. This is showing signs of progress and again appears to be very healthy which is excellent news. Fortunately there is no sign of active  infection by way of odor or drainage at this point. Overall I'm very pleased with how things stand. He seems to be tolerating the Wound VAC without complication. 08/23/18 on evaluation today patient actually appears to be doing better in regard to his wound. He has been tolerating the Wound VAC without complication and in fact it has been collecting a significant amount of drainage which I think is good news especially considering how the wound appears. Fortunately there is no signs of infection at this time definitely nothing appears to be worse which is good news. He has not been started on the Bactrim and Flagyl that was recommended by Dr. Delaine Lame yet. I did actually contact her office this morning in order to check and see were things are that regard their gonna be calling me back. 08/30/18 on evaluation today patient actually appears to show  signs of excellent improvement today compared to last evaluation. The undermining is getting much better the wound seems to be feeling quite nicely and I'm very pleased that the granulation in general. With that being said overall I feel like the patient has made excellent progress which is great news. No fevers, chills, nausea, or vomiting noted at this time. Patient History Information obtained from Patient. Family History Hypertension - Father, Stroke - Mother, No family history of Cancer, Diabetes, Heart Disease, Kidney Disease, Lung Disease, Seizures, Thyroid Problems, Tuberculosis. Social History Never smoker, Marital Status - Married, Alcohol Use - Never, Drug Use - No History, Caffeine Use - Daily. Medical History Eyes Patient has history of Cataracts - both removed Denies history of Glaucoma, Optic Neuritis Ear/Nose/Mouth/Throat Denies history of Chronic sinus problems/congestion, Middle ear problems Hematologic/Lymphatic Denies history of Anemia, Hemophilia, Human Immunodeficiency Virus, Lymphedema Respiratory Denies history of  Aspiration, Asthma, Chronic Obstructive Pulmonary Disease (COPD), Pneumothorax, Sleep Apnea, Tuberculosis Cardiovascular Patient has history of Hypertension - takes medication Denies history of Angina, Arrhythmia, Congestive Heart Failure, Coronary Artery Disease, Deep Vein Thrombosis, Hypotension, Myocardial Infarction, Peripheral Arterial Disease, Peripheral Venous Disease, Phlebitis, Vasculitis Gastrointestinal Denies history of Cirrhosis , Colitis, Crohn s, Hepatitis A, Hepatitis B, Hepatitis C Endocrine Denies history of Type I Diabetes, Type II Diabetes Genitourinary Denies history of End Stage Renal Disease Immunological Denies history of Lupus Erythematosus, Raynaud s, Scleroderma Rader, Jt J. ([id number]) Integumentary (Skin) Denies history of History of Burn, History of pressure wounds Musculoskeletal Denies history of Gout, Rheumatoid Arthritis, Osteoarthritis, Osteomyelitis Neurologic Patient has history of Paraplegia - waist down Denies history of Dementia, Neuropathy, Quadriplegia, Seizure Disorder Oncologic Denies history of Received Chemotherapy, Received Radiation Psychiatric Denies history of Anorexia/bulimia, Confinement Anxiety Medical And Surgical History Notes Oncologic Prostate cancer- currently treated with horomone therapy Review of Systems (ROS) Constitutional Symptoms (General Health) Denies complaints or symptoms of Fever, Chills. Respiratory The patient has no complaints or symptoms. Cardiovascular The patient has no complaints or symptoms. Psychiatric The patient has no complaints or symptoms. Objective Constitutional Well-nourished and well-hydrated in no acute distress. Vitals Time Taken: 8:34 AM, Height: 73 in, Weight: 210 lbs, BMI: 27.7, Temperature: 98.2 F, Pulse: 77 bpm, Respiratory Rate: 16 breaths/min, Blood Pressure: 124/67 mmHg. Respiratory normal breathing without difficulty. Psychiatric this patient is able to make  decisions and demonstrates good insight into disease process. Alert and Oriented x 3. pleasant and cooperative. General Notes: Patient's wound bed currently shows evidence of good granulation. He he has been tolerating the dressing changes without complication. Sharp debridement was performed today to clear away the slough from the surface of the wound and help improve the overall quality of the wound bed. I think that the Wound VAC is still an appropriate way to go as far as managing this wound currently. Integumentary (Hair, Skin) Wound #1 status is Open. Original cause of wound was Pressure Injury. The wound is located on the Right Gluteal fold. The wound measures 4.9cm length x 2.2cm width x 4cm depth; 8.467cm^2 area and 33.866cm^3 volume. There is Fat KAINOA, SWOBODA. ([id number]) (Subcutaneous Tissue) Exposed exposed. There is no tunneling noted, however, there is undermining starting at 11:00 and ending at 3:00 with a maximum distance of 3.9cm. There is a medium amount of serosanguineous drainage noted. The wound margin is epibole. There is medium (34-66%) red, pink, hyper - granulation within the wound bed. There is a medium (34-66%) amount of necrotic tissue within the wound bed including Adherent Slough. The periwound  skin appearance exhibited: Rash, Maceration. The periwound skin appearance did not exhibit: Callus, Crepitus, Excoriation, Induration, Scarring, Dry/Scaly, Atrophie Blanche, Cyanosis, Ecchymosis, Hemosiderin Staining, Mottled, Pallor, Rubor, Erythema. Periwound temperature was noted as No Abnormality. 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 There was a  Excisional Skin/Subcutaneous Tissue/Muscle Debridement with a total area of 10.78 sq cm performed by [name], HOYT E., [name]. With the following instrument(s): Curette to remove Viable and Non-Viable tissue/material. Material removed includes Muscle, Subcutaneous Tissue, and Slough after achieving pain control using Lidocaine. No specimens were taken. A time out was conducted at 09:27, prior to the start of the procedure. A Moderate amount of bleeding was controlled with Pressure. The procedure was tolerated well with a pain level of Insensate throughout. Post Debridement Measurements: 4.9cm length x 2.2cm width x 4cm depth; 33.866cm^3 volume. Post debridement Stage noted as Category/Stage IV. Character of Wound/Ulcer Post Debridement is stable. Post procedure Diagnosis Wound #1: Same as Pre-Procedure Plan Wound Cleansing: Wound #1 Right Gluteal fold: Cleanse wound with mild soap and water Anesthetic (add to Medication List): Wound #1 Right Gluteal fold: Topical Lidocaine 4% cream applied to wound bed prior to debridement (In Clinic Only). Primary Wound Dressing: Wound #1 Right Gluteal fold: Silver Alginate - in clinic until SNF restarts NPWT Secondary Dressing: Wound #1 Right Gluteal fold: IZELL, LABAT. ([id number]) Boardered Foam Dressing Dressing Change Frequency: Wound #1 Right Gluteal fold: Change Dressing Monday, Wednesday, Friday - and as needed Follow-up Appointments: Wound #1 Right Gluteal fold: Return Appointment in 1 week. Off-Loading: Wound #1 Right Gluteal fold: Mattress - Please continue air mattress at SNF Turn and reposition every 2 hours Negative Pressure Wound Therapy: Wound #1 Right Gluteal fold: Wound VAC settings at 125/130 mmHg continuous pressure. Use BLACK/GREEN foam to wound cavity. Use WHITE foam to fill any tunnel/s and/or undermining. Change VAC dressing 3 X WEEK. Change canister as indicated when full. Nurse may titrate settings and frequency  of dressing changes as clinically indicated. - white foam to any tunnels Home Health Nurse may d/c VAC for s/s of increased infection, significant wound regression, or uncontrolled drainage. [place] at [phone]. Number of foam/gauze pieces used in the dressing = Medications-please add to medication list.: Wound #1 Right Gluteal fold: Other: - IV antibiotics, difucan PO I'm in a recommend that we continue with the above wound care measures for the next week. The patient is in agreement with plan. We will subsequently see were things stand at follow-up. If anything changes or worsens in the meantime he will contact the office and let me know. Please see above for specific wound care orders. We will see patient for re-evaluation in 1 week(s) here in the clinic. If anything worsens or changes patient will contact our office for additional recommendations. Electronic Signature(s) Signed: 08/31/2018 5:08:21 PM By: Worthy Keeler [name] Entered By: Worthy Keeler on 08/30/2018 09:51:08 DARELD, MCAULIFFE ([id number]) -------------------------------------------------------------------------------- ROS/PFSH Details Patient Name: [name] Date of Service: 08/30/2018 8:30 AM Medical Record Number: [id number] Patient Account Number: [id number] Date of Birth/Sex: 13-Dec-1950 (68 y.o. M) Treating RN: [name] Primary Care Provider: [name] Other Clinician: Referring Provider: [name] Treating Provider/Extender: [name], HOYT Weeks in Treatment: 81 Information Obtained From Patient Wound  History Do you currently have one or more open woundso Yes How many open wounds do you currently haveo 1 Approximately how long have you had your woundso 1 month How have you been treating your wound(s) until nowo cream Has your wound(s) ever healed and then re-openedo No Have you had any lab work done in the past montho No Have you tested positive for an  antibiotic resistant organism (MRSA, VRE)o No Have you tested positive for osteomyelitis (bone infection)o No Have you had any tests for circulation on your legso No Constitutional Symptoms (General Health) Complaints and Symptoms: Negative for: Fever; Chills Eyes Medical History: Positive for: Cataracts - both removed Negative for: Glaucoma; Optic Neuritis Ear/Nose/Mouth/Throat Medical History: Negative for: Chronic sinus problems/congestion; Middle ear problems Hematologic/Lymphatic Medical History: Negative for: Anemia; Hemophilia; Human Immunodeficiency Virus; Lymphedema Respiratory Complaints and Symptoms: No Complaints or Symptoms Medical History: Negative for: Aspiration; Asthma; Chronic Obstructive Pulmonary Disease (COPD); Pneumothorax; Sleep Apnea; Tuberculosis Cardiovascular Complaints and Symptoms: No Complaints or Symptoms Medical History: ORDELL, PRICHETT ([id number]) Positive for: Hypertension - takes medication Negative for: Angina; Arrhythmia; Congestive Heart Failure; Coronary Artery Disease; Deep Vein Thrombosis; Hypotension; Myocardial Infarction; Peripheral Arterial Disease; Peripheral Venous Disease; Phlebitis; Vasculitis Gastrointestinal Medical History: Negative for: Cirrhosis ; Colitis; Crohnos; Hepatitis A; Hepatitis B; Hepatitis C Endocrine Medical History: Negative for: Type I Diabetes; Type II Diabetes Genitourinary Medical History: Negative for: End Stage Renal Disease Immunological Medical History: Negative for: Lupus Erythematosus; Raynaudos; Scleroderma Integumentary (Skin) Medical History: Negative for: History of Burn; History of pressure wounds Musculoskeletal Medical History: Negative for: Gout; Rheumatoid Arthritis; Osteoarthritis; Osteomyelitis Neurologic Medical History: Positive for: Paraplegia - waist down Negative for: Dementia; Neuropathy; Quadriplegia; Seizure Disorder Oncologic Medical History: Negative for:  Received Chemotherapy; Received Radiation Past Medical History Notes: Prostate cancer- currently treated with horomone therapy Psychiatric Complaints and Symptoms: No Complaints or Symptoms Medical History: Negative for: Anorexia/bulimia; Confinement Anxiety HBO Extended History Items Eyes: Cataracts SAMANYU, TINNELL. ([id number]) Immunizations Pneumococcal Vaccine: Received Pneumococcal Vaccination: No Implantable Devices Family and Social History Cancer: No; Diabetes: No; Heart Disease: No; Hypertension: Yes - Father; Kidney Disease: No; Lung Disease: No; Seizures: No; Stroke: Yes - Mother; Thyroid Problems: No; Tuberculosis: No; Never smoker; Marital Status - Married; Alcohol Use: Never; Drug Use: No History; Caffeine Use: Daily; Advanced Directives: Yes (Copy provided); Patient does not want information on Advanced Directives; Living Will: Yes (Copy provided) Electronic Signature(s) Signed: 08/31/2018 11:53:49 AM By: Gretta Cool, BSN, RN, CWS, Kim RN, BSN Signed: 08/31/2018 5:08:21 PM By: Worthy Keeler [name] Entered By: Worthy Keeler on 08/30/2018 09:50:26 WILLOW, RECZEK ([id number]) -------------------------------------------------------------------------------- SuperBill Details Patient Name: [name] Date of Service: 08/30/2018 Medical Record Number: [id number] Patient Account Number: [id number] Date of Birth/Sex: 07/30/1950 (68 y.o. 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

## 2018-09-01 NOTE — Progress Notes (Signed)
Brett, [name] ([id number]) Visit Report for 08/30/2018 Arrival Information Details Patient Name: [name], [name] Date of Service: 08/30/2018 8:30 AM Medical Record Number: [id number] Patient Account Number: [id number] Date of Birth/Sex: September 08, 1950 (67 y.o. M) Treating RN: [name] Primary Care Tanisia Yokley: [name] Other Clinician: Referring Nikie [name]: [name] Treating Lashon Hillier/Extender: [name], HOYT Weeks in Treatment: 62 Visit Information History Since Last Visit Added or deleted any medications: Yes Patient Arrived: Wheel Chair Any new allergies or adverse reactions: No Arrival Time: 08:31 Had a fall or experienced change in No Accompanied By: wife activities of daily living that may affect Transfer Assistance: Manual risk of falls: Patient Identification Verified: Yes Signs or symptoms of abuse/neglect since last visito No Secondary Verification Process Completed: Yes Hospitalized since last visit: No Patient Requires Transmission-Based No Implantable device outside of the clinic excluding No Precautions: cellular tissue based products placed in the center Patient Has Alerts: Yes since last visit: Patient Alerts: NOT Has Dressing in Place as Prescribed: Yes Diabetic Pain Present Now: Yes Electronic Signature(s) Signed: 08/30/2018 4:24:19 PM By: Lorine Bears RCP, RRT, CHT Entered By: Lorine Bears on 08/30/2018 08:34:11 KENTRAVIOUS, LIPFORD ([id number]) -------------------------------------------------------------------------------- Encounter Discharge Information Details Patient Name: [name] Date of Service: 08/30/2018 8:30 AM Medical Record Number: [id number] Patient Account Number: [id number] Date of Birth/Sex: 02-21-1951 (67 y.o. M) Treating RN: [name] Primary Care Mikal Wisman: [name] Other Clinician: Referring Dorla Guizar: [name] Treating Sakoya Win/Extender: [name],  HOYT Weeks in Treatment: 25 Encounter Discharge Information Items Post Procedure Vitals Discharge Condition: Stable Temperature (F): 98.2 Ambulatory Status: Ambulatory Pulse (bpm): 77 Discharge Destination: Home Respiratory Rate (breaths/min): 16 Transportation: Private Auto Blood Pressure (mmHg): 124/67 Accompanied By: wife Schedule Follow-up Appointment: Yes Clinical Summary of Care: Electronic Signature(s) Signed: 08/31/2018 11:53:49 AM By: Gretta Cool, BSN, RN, CWS, Kim RN, BSN Entered By: Gretta Cool, BSN, RN, CWS, Kim on 08/30/2018 09:35:22 SELMA, RODELO ([id number]) -------------------------------------------------------------------------------- Lower Extremity Assessment Details Patient Name: [name] Date of Service: 08/30/2018 8:30 AM Medical Record Number: [id number] Patient Account Number: [id number] Date of Birth/Sex: 25-Mar-1951 (67 y.o. M) Treating RN: Secundino Ginger Primary Care Delanie Tirrell: [name] Other Clinician: Referring Makira Holleman: [name] Treating Atleigh Gruen/Extender: [name], HOYT Weeks in Treatment: 39 Electronic Signature(s) Signed: 08/30/2018 11:33:08 AM By: Secundino Ginger Entered By: Secundino Ginger on 08/30/2018 09:08:07 [name] ([id number]) -------------------------------------------------------------------------------- Multi Wound Chart Details Patient Name: [name] Date of Service: 08/30/2018 8:30 AM Medical Record Number: [id number] Patient Account Number: [id number] Date of Birth/Sex: 10/17/1950 (67 y.o. M) Treating RN: [name] Primary Care Joselle Deeds: [name] Other Clinician: Referring Rakhi Romagnoli: [name] Treating Edenilson Austad/Extender: [name], HOYT Weeks in Treatment: 39 Vital Signs Height(in): 73 Pulse(bpm): 77 Weight(lbs): 210 Blood Pressure(mmHg): 124/67 Body Mass Index(BMI): 28 Temperature(F): 98.2 Respiratory Rate 16 (breaths/min): Photos: [1:No Photos] [N/A:N/A] Wound Location:  [1:Right Gluteal fold] [N/A:N/A] Wounding Event: [1:Pressure Injury] [N/A:N/A] Primary Etiology: [1:Pressure Ulcer] [N/A:N/A] Comorbid History: [1:Cataracts, Hypertension, Paraplegia] [N/A:N/A] Date Acquired: [1:11/02/2017] [N/A:N/A] Weeks of Treatment: [1:39] [N/A:N/A] Wound Status: [1:Open] [N/A:N/A] Measurements L x W x D [1:4.9x2.2x4] [N/A:N/A] (cm) Area (cm) : [1:8.467] [N/A:N/A] Volume (cm) : [1:33.866] [N/A:N/A] % Reduction in Area: [1:15.80%] [N/A:N/A] % Reduction in Volume: [1:-3269.80%] [N/A:N/A] Classification: [1:Category/Stage IV] [N/A:N/A] Exudate Amount: [1:Medium] [N/A:N/A] Exudate Type: [1:Serosanguineous] [N/A:N/A] Exudate Color: [1:red, brown] [N/A:N/A] Wound Margin: [1:Epibole] [N/A:N/A] Granulation Amount: [1:Medium (34-66%)] [N/A:N/A] Granulation Quality: [1:Red, Pink, Hyper-granulation] [N/A:N/A] Necrotic Amount: [1:Medium (34-66%)] [N/A:N/A] Exposed Structures: [1:Fat Layer (Subcutaneous Tissue) Exposed: Yes Fascia: No Tendon:  No Muscle: No Joint: No Bone: No] [N/A:N/A] Epithelialization: [1:Small (1-33%)] [N/A:N/A] Periwound Skin Texture: [1:Rash: Yes Excoriation: No Induration: No Callus: No Crepitus: No Scarring: No] [N/A:N/A] Periwound Skin Moisture: Maceration: Yes N/A N/A Dry/Scaly: No Periwound Skin Color: Atrophie Blanche: No N/A N/A Cyanosis: No Ecchymosis: No Erythema: No Hemosiderin Staining: No Mottled: No Pallor: No Rubor: No Temperature: No Abnormality N/A N/A Tenderness on Palpation: No N/A N/A Wound Preparation: Ulcer Cleansing: N/A N/A Rinsed/Irrigated with Saline Topical Anesthetic Applied: Other: lidocaine 4% Treatment Notes Electronic Signature(s) Signed: 08/31/2018 11:53:49 AM By: Gretta Cool, BSN, RN, CWS, Kim RN, BSN Entered By: Gretta Cool, BSN, RN, CWS, Kim on 08/30/2018 09:27:01 BERKLEY, CRONKRIGHT ([id number]) -------------------------------------------------------------------------------- Multi-Disciplinary Care Plan  Details Patient Name: [name] Date of Service: 08/30/2018 8:30 AM Medical Record Number: [id number] Patient Account Number: [id number] Date of Birth/Sex: Feb 14, 1951 (67 y.o. M) Treating RN: [name] Primary Care Christena Sunderlin: [name] Other Clinician: Referring Tarrin Lebow: [name] Treating Stryder Poitra/Extender: [name], HOYT Weeks in Treatment: 52 Active Inactive Orientation to the Wound Care Program Nursing Diagnoses: Knowledge deficit related to the wound healing center program Goals: Patient/caregiver will verbalize understanding of the [facility] Date Initiated: 11/30/2017 Target Resolution Date: 12/21/2017 Goal Status: Active Interventions: Provide education on orientation to the wound center Notes: Pressure Nursing Diagnoses: Knowledge deficit related to management of pressures ulcers Goals: Patient/caregiver will verbalize understanding of pressure ulcer management Date Initiated: 05/17/2018 Target Resolution Date: 05/28/2018 Goal Status: Active Interventions: Assess: immobility, friction, shearing, incontinence upon admission and as needed Notes: Wound/Skin Impairment Nursing Diagnoses: Impaired tissue integrity Goals: Patient/caregiver will verbalize understanding of skin care regimen Date Initiated: 11/30/2017 Target Resolution Date: 12/21/2017 Goal Status: Active Ulcer/skin breakdown will have a volume reduction of 30% by week 4 Date Initiated: 11/30/2017 Target Resolution Date: 12/21/2017 Goal Status: Active KAILYN, DUBIE ([id number]) Interventions: Assess patient/caregiver ability to obtain necessary supplies Assess patient/caregiver ability to perform ulcer/skin care regimen upon admission and as needed Assess ulceration(s) every visit Treatment Activities: Skin care regimen initiated : 11/30/2017 Notes: Electronic Signature(s) Signed: 08/31/2018 11:53:49 AM By: Gretta Cool, BSN, RN, CWS, Kim RN, BSN Entered By: Gretta Cool,  BSN, RN, CWS, Kim on 08/30/2018 09:26:53 FADI, MENTER ([id number]) -------------------------------------------------------------------------------- Pain Assessment Details Patient Name: [name] Date of Service: 08/30/2018 8:30 AM Medical Record Number: [id number] Patient Account Number: [id number] Date of Birth/Sex: 26-Dec-1950 (67 y.o. M) Treating RN: [name] Primary Care Cade Olberding: [name] Other Clinician: Referring Maximino Cozzolino: [name] Treating Philicia Heyne/Extender: [name], HOYT Weeks in Treatment: 34 Active Problems Location of Pain Severity and Description of Pain Patient Has Paino Yes Site Locations Rate the pain. Current Pain Level: 6 Pain Management and Medication Current Pain Management: Electronic Signature(s) Signed: 08/30/2018 4:24:19 PM By: Lorine Bears RCP, RRT, CHT Signed: 08/31/2018 11:53:49 AM By: Gretta Cool, BSN, RN, CWS, Kim RN, BSN Entered By: Lorine Bears on 08/30/2018 08:34:24 ANTAVIOUS, SPANOS ([id number]) -------------------------------------------------------------------------------- Patient/Caregiver Education Details Patient Name: [name] Date of Service: 08/30/2018 8:30 AM Medical Record Number: [id number] Patient Account Number: [id number] Date of Birth/Gender: [birth year] (67 y.o. M) Treating RN: [name] Primary Care Physician: [name] Other Clinician: Referring Physician: [name] Treating Physician/Extender: Sharalyn Ink in Treatment: 24 Education Assessment Education Provided To: Patient Education Topics Provided Pressure: Handouts: Pressure Ulcers: Care and Offloading Methods: Demonstration, Explain/Verbal Responses: State content correctly Wound/Skin Impairment: Handouts: Caring for Your Ulcer Methods: Demonstration, Explain/Verbal Responses: State content correctly Electronic Signature(s) Signed: 08/31/2018 11:53:49 AM By: Gretta Cool,  BSN, RN, CWS, Kim RN, BSN Entered By: Gretta Cool, BSN, RN, CWS, Kim on 08/30/2018 09:34:19 RAZIEL, KOENIGS ([id number]) -------------------------------------------------------------------------------- Wound Assessment Details Patient Name: [name] Date of Service: 08/30/2018 8:30 AM Medical Record Number: [id number] Patient Account Number: [id number] Date of Birth/Sex: Jan 21, 1951 (67 y.o. M) Treating RN: Secundino Ginger Primary Care Esterlene Atiyeh: [name] Other Clinician: Referring Chioke Noxon: [name] Treating Quillan Whitter/Extender: [name], HOYT Weeks in Treatment: 39 Wound Status Wound Number: 1 Primary Etiology: Pressure Ulcer Wound Location: Right Gluteal fold Wound Status: Open Wounding Event: Pressure Injury Comorbid History: Cataracts, Hypertension, Paraplegia Date Acquired: 11/02/2017 Weeks Of Treatment: 39 Clustered Wound: No Photos Photo Uploaded By: Secundino Ginger on 08/30/2018 10:27:53 Wound Measurements Length: (cm) 4.9 % Reduct Width: (cm) 2.2 % Reduct Depth: (cm) 4 Epitheli Area: (cm) 8.467 Tunneli Volume: (cm) 33.866 Undermi Start Endin Maxim ion in Area: 15.8% ion in Volume: -3269.8% alization: Small (1-33%) ng: No ning: Yes ing Position (o'clock): 11 g Position (o'clock): 3 um Distance: (cm) 3.9 Wound Description Classification: Category/Stage IV Foul Odo Wound Margin: Epibole Slough/F Exudate Amount: Medium Exudate Type: Serosanguineous Exudate Color: red, brown r After Cleansing: No ibrino Yes Wound Bed Granulation Amount: Medium (34-66%) Exposed Structure Granulation Quality: Red, Pink, Hyper-granulation Fascia Exposed: No Necrotic Amount: Medium (34-66%) Fat Layer (Subcutaneous Tissue) Exposed: Yes Necrotic Quality: Adherent Slough Tendon Exposed: No Muscle Exposed: No TORRES, HARDENBROOK. ([id number]) Joint Exposed: No Bone Exposed: No Periwound Skin Texture Texture Color No Abnormalities Noted: No No Abnormalities  Noted: No Callus: No Atrophie Blanche: No Crepitus: No Cyanosis: No Excoriation: No Ecchymosis: No Induration: No Erythema: No Rash: Yes Hemosiderin Staining: No Scarring: No Mottled: No Pallor: No Moisture Rubor: No No Abnormalities Noted: No Dry / Scaly: No Temperature / Pain Maceration: Yes Temperature: No Abnormality Wound Preparation Ulcer Cleansing: Rinsed/Irrigated with Saline Topical Anesthetic Applied: Other: lidocaine 4%, Treatment Notes Wound #1 (Right Gluteal fold) Notes Scell, bordered foam dressing; SNF nurses to reapply NPWT Electronic Signature(s) Signed: 08/30/2018 11:33:08 AM By: Secundino Ginger Signed: 08/31/2018 11:53:49 AM By: Gretta Cool, BSN, RN, CWS, Kim RN, BSN Entered By: Gretta Cool, BSN, RN, CWS, Kim on 08/30/2018 09:29:41 DIRK, VANAMAN ([id number]) -------------------------------------------------------------------------------- [place] Details Patient Name: [name] Date of Service: 08/30/2018 8:30 AM Medical Record Number: [id number] Patient Account Number: [id number] Date of Birth/Sex: 06-May-1951 (67 y.o. M) Treating RN: [name] Primary Care Maryama Kuriakose: [name] Other Clinician: Referring Kitai Purdom: [name] Treating Casyn Becvar/Extender: [name], HOYT Weeks in Treatment: 39 Vital Signs Time Taken: 08:34 Temperature (F): 98.2 Height (in): 73 Pulse (bpm): 77 Weight (lbs): 210 Respiratory Rate (breaths/min): 16 Body Mass Index (BMI): 27.7 Blood Pressure (mmHg): 124/67 Reference Range: 80 - 120 mg / dl Airway Electronic Signature(s) Signed: 08/30/2018 4:24:19 PM By: Lorine Bears RCP, RRT, CHT Entered By: Lorine Bears on 08/30/2018 08:37:25

## 2018-09-06 ENCOUNTER — Encounter: Payer: Medicare Other | Admitting: Physician Assistant

## 2018-09-06 DIAGNOSIS — E11622 Type 2 diabetes mellitus with other skin ulcer: Secondary | ICD-10-CM | POA: Diagnosis not present

## 2018-09-07 NOTE — Progress Notes (Signed)
Brett, [name] ([id number]) Visit Report for 09/06/2018 Arrival Information Details Patient Name: [name], [name] Date of Service: 09/06/2018 8:30 AM Medical Record Number: [id number] Patient Account Number: [id number] Date of Birth/Sex: Nov 25, 1950 (68 y.o. M) Treating RN: Harold Barban Primary Care Liticia Gasior: [name] Other Clinician: Referring Jasminemarie Sherrard: [name] Treating Hue Steveson/Extender: [name], HOYT Weeks in Treatment: 23 Visit Information History Since Last Visit Added or deleted any medications: No Patient Arrived: Wheel Chair Any new allergies or adverse reactions: No Arrival Time: 08:27 Had a fall or experienced change in No Accompanied By: wife activities of daily living that may affect Transfer Assistance: None risk of falls: Patient Identification Verified: Yes Signs or symptoms of abuse/neglect since last visito No Secondary Verification Process Completed: Yes Hospitalized since last visit: No Patient Requires Transmission-Based No Implantable device outside of the clinic excluding No Precautions: cellular tissue based products placed in the center Patient Has Alerts: Yes since last visit: Patient Alerts: NOT Has Dressing in Place as Prescribed: Yes Diabetic Pain Present Now: Yes Electronic Signature(s) Signed: 09/06/2018 3:36:08 PM By: Lorine Bears RCP, RRT, CHT Entered By: Lorine Bears on 09/06/2018 08:28:10 AVISHAI, REIHL ([id number]) -------------------------------------------------------------------------------- Lower Extremity Assessment Details Patient Name: [name] Date of Service: 09/06/2018 8:30 AM Medical Record Number: [id number] Patient Account Number: [id number] Date of Birth/Sex: Nov 28, 1950 (68 y.o. M) Treating RN: Army Melia Primary Care Benzion Mesta: [name] Other Clinician: Referring Ever Gustafson: [name] Treating Mariame Rybolt/Extender: [name],  HOYT Weeks in Treatment: 40 Electronic Signature(s) Signed: 09/06/2018 8:46:05 AM By: Army Melia Entered By: Army Melia on 09/06/2018 08:34:09 ARAF, CLUGSTON ([id number]) -------------------------------------------------------------------------------- Multi Wound Chart Details Patient Name: [name] Date of Service: 09/06/2018 8:30 AM Medical Record Number: [id number] Patient Account Number: [id number] Date of Birth/Sex: 1950-07-29 (68 y.o. M) Treating RN: Harold Barban Primary Care Storm Dulski: [name] Other Clinician: Referring Stefan Karen: [name] Treating Cristal Howatt/Extender: [name], HOYT Weeks in Treatment: 40 Vital Signs Height(in): 73 Pulse(bpm): 89 Weight(lbs): 210 Blood Pressure(mmHg): 134/80 Body Mass Index(BMI): 28 Temperature(F): 98.5 Respiratory Rate 16 (breaths/min): Photos: [1:No Photos] [N/A:N/A] Wound Location: [1:Right Gluteal fold] [N/A:N/A] Wounding Event: [1:Pressure Injury] [N/A:N/A] Primary Etiology: [1:Pressure Ulcer] [N/A:N/A] Comorbid History: [1:Cataracts, Hypertension, Paraplegia] [N/A:N/A] Date Acquired: [1:11/02/2017] [N/A:N/A] Weeks of Treatment: [1:40] [N/A:N/A] Wound Status: [1:Open] [N/A:N/A] Measurements L x W x D [1:4x1.5x3] [N/A:N/A] (cm) Area (cm) : [1:4.712] [N/A:N/A] Volume (cm) : [1:14.137] [N/A:N/A] % Reduction in Area: [1:53.10%] [N/A:N/A] % Reduction in Volume: [1:-1306.70%] [N/A:N/A] Classification: [1:Category/Stage IV] [N/A:N/A] Exudate Amount: [1:Medium] [N/A:N/A] Exudate Type: [1:Serosanguineous] [N/A:N/A] Exudate Color: [1:red, brown] [N/A:N/A] Wound Margin: [1:Epibole] [N/A:N/A] Granulation Amount: [1:Medium (34-66%)] [N/A:N/A] Granulation Quality: [1:Red, Pink, Hyper-granulation] [N/A:N/A] Necrotic Amount: [1:Medium (34-66%)] [N/A:N/A] Exposed Structures: [1:Fat Layer (Subcutaneous Tissue) Exposed: Yes Fascia: No Tendon: No Muscle: No Joint: No Bone: No]  [N/A:N/A] Epithelialization: [1:Small (1-33%)] [N/A:N/A] Periwound Skin Texture: [1:Rash: Yes Excoriation: No Induration: No Callus: No Crepitus: No Scarring: No] [N/A:N/A] Periwound Skin Moisture: Maceration: Yes N/A N/A Dry/Scaly: No Periwound Skin Color: Atrophie Blanche: No N/A N/A Cyanosis: No Ecchymosis: No Erythema: No Hemosiderin Staining: No Mottled: No Pallor: No Rubor: No Temperature: No Abnormality N/A N/A Tenderness on Palpation: No N/A N/A Wound Preparation: Ulcer Cleansing: N/A N/A Rinsed/Irrigated with Saline Topical Anesthetic Applied: Other: lidocaine 4% Treatment Notes Electronic Signature(s) Signed: 09/06/2018 9:36:27 AM By: Harold Barban Entered By: Harold Barban on 09/06/2018 08:41:45 KAISER, BELLUOMINI ([id number]) -------------------------------------------------------------------------------- [place] Details Patient Name: [name] Date of Service: 09/06/2018 8:30 AM Medical Record Number:  [id number] Patient Account Number: [id number] Date of Birth/Sex: 1950/08/10 (68 y.o. M) Treating RN: Harold Barban Primary Care Detrick Dani: [name] Other Clinician: Referring Anayelli Lai: [name] Treating Orrin Yurkovich/Extender: [name], HOYT Weeks in Treatment: 40 Active Inactive Orientation to the Wound Care Program Nursing Diagnoses: Knowledge deficit related to the wound healing center program Goals: Patient/caregiver will verbalize understanding of the [facility] Date Initiated: 11/30/2017 Target Resolution Date: 12/21/2017 Goal Status: Active Interventions: Provide education on orientation to the wound center Notes: Pressure Nursing Diagnoses: Knowledge deficit related to management of pressures ulcers Goals: Patient/caregiver will verbalize understanding of pressure ulcer management Date Initiated: 05/17/2018 Target Resolution Date: 05/28/2018 Goal Status:  Active Interventions: Assess: immobility, friction, shearing, incontinence upon admission and as needed Notes: Wound/Skin Impairment Nursing Diagnoses: Impaired tissue integrity Goals: Patient/caregiver will verbalize understanding of skin care regimen Date Initiated: 11/30/2017 Target Resolution Date: 12/21/2017 Goal Status: Active Ulcer/skin breakdown will have a volume reduction of 30% by week 4 Date Initiated: 11/30/2017 Target Resolution Date: 12/21/2017 Goal Status: Active YAPHET, SMETHURST ([id number]) Interventions: Assess patient/caregiver ability to obtain necessary supplies Assess patient/caregiver ability to perform ulcer/skin care regimen upon admission and as needed Assess ulceration(s) every visit Treatment Activities: Skin care regimen initiated : 11/30/2017 Notes: Electronic Signature(s) Signed: 09/06/2018 9:36:27 AM By: Harold Barban Entered By: Harold Barban on 09/06/2018 08:41:37 [name] ([id number]) -------------------------------------------------------------------------------- Pain Assessment Details Patient Name: [name] Date of Service: 09/06/2018 8:30 AM Medical Record Number: [id number] Patient Account Number: [id number] Date of Birth/Sex: Jan 31, 1951 (67 y.o. M) Treating RN: Harold Barban Primary Care Dantonio Justen: [name] Other Clinician: Referring Jasie Meleski: [name] Treating Cora Stetson/Extender: [name], HOYT Weeks in Treatment: 40 Active Problems Location of Pain Severity and Description of Pain Patient Has Paino Yes Site Locations Rate the pain. Current Pain Level: 5 Pain Management and Medication Current Pain Management: Electronic Signature(s) Signed: 09/06/2018 9:36:27 AM By: Harold Barban Signed: 09/06/2018 3:36:08 PM By: Lorine Bears RCP, RRT, CHT Entered By: Lorine Bears on 09/06/2018 09:32:67 KEETON, KASSEBAUM  ([id number]) -------------------------------------------------------------------------------- Patient/Caregiver Education Details Patient Name: [name] Date of Service: 09/06/2018 8:30 AM Medical Record Number: [id number] Patient Account Number: [id number] Date of Birth/Gender: [birth year] (67 y.o. M) Treating RN: Harold Barban Primary Care Physician: [name] Other Clinician: Referring Physician: [name] Treating Physician/Extender: Sharalyn Ink in Treatment: 40 Education Assessment Education Provided To: Patient Education Topics Provided Wound/Skin Impairment: Handouts: Caring for Your Ulcer Methods: Demonstration, Explain/Verbal Responses: State content correctly Electronic Signature(s) Signed: 09/06/2018 9:36:27 AM By: Harold Barban Entered By: Harold Barban on 09/06/2018 08:42:36 [name] ([id number]) -------------------------------------------------------------------------------- Wound Assessment Details Patient Name: [name] Date of Service: 09/06/2018 8:30 AM Medical Record Number: [id number] Patient Account Number: [id number] Date of Birth/Sex: 04/17/1951 (67 y.o. M) Treating RN: Army Melia Primary Care Akram Kissick: [name] Other Clinician: Referring Zyshawn Bohnenkamp: [name] Treating Geri Hepler/Extender: [name], HOYT Weeks in Treatment: 40 Wound Status Wound Number: 1 Primary Etiology: Pressure Ulcer Wound Location: Right Gluteal fold Wound Status: Open Wounding Event: Pressure Injury Comorbid History: Cataracts, Hypertension, Paraplegia Date Acquired: 11/02/2017 Weeks Of Treatment: 40 Clustered Wound: No Photos Photo Uploaded By: Army Melia on 09/06/2018 11:45:08 Wound Measurements Length: (cm) 4 Width: (cm) 1.5 Depth: (cm) 3 Area: (cm) 4.712 Volume: (cm) 14.137 % Reduction in Area: 53.1% % Reduction in Volume: -1306.7% Epithelialization: Small (1-33%) Tunneling:  No Undermining: No Wound Description Classification: Category/Stage IV Foul Odo Wound Margin: Epibole Slough/F Exudate Amount: Medium Exudate Type: Serosanguineous  Exudate Color: red, brown r After Cleansing: No ibrino Yes Wound Bed Granulation Amount: Medium (34-66%) Exposed Structure Granulation Quality: Red, Pink, Hyper-granulation Fascia Exposed: No Necrotic Amount: Medium (34-66%) Fat Layer (Subcutaneous Tissue) Exposed: Yes Necrotic Quality: Adherent Slough Tendon Exposed: No Muscle Exposed: No Joint Exposed: No Bone Exposed: No Periwound Skin Texture Page, Amman J. ([id number]) Texture Color No Abnormalities Noted: No No Abnormalities Noted: No Callus: No Atrophie Blanche: No Crepitus: No Cyanosis: No Excoriation: No Ecchymosis: No Induration: No Erythema: No Rash: Yes Hemosiderin Staining: No Scarring: No Mottled: No Pallor: No Moisture Rubor: No No Abnormalities Noted: No Dry / Scaly: No Temperature / Pain Maceration: Yes Temperature: No Abnormality Wound Preparation Ulcer Cleansing: Rinsed/Irrigated with Saline Topical Anesthetic Applied: Other: lidocaine 4%, Electronic Signature(s) Signed: 09/06/2018 8:46:05 AM By: Army Melia Entered By: Army Melia on 09/06/2018 08:33:55 ZADRIAN, MCCAULEY ([id number]) -------------------------------------------------------------------------------- Vitals Details Patient Name: [name] Date of Service: 09/06/2018 8:30 AM Medical Record Number: [id number] Patient Account Number: [id number] Date of Birth/Sex: [birth year] (67 y.o. M) Treating RN: Harold Barban Primary Care Margaret Staggs: [name] Other Clinician: Referring Rochel Privett: [name] Treating Idonia Zollinger/Extender: [name], HOYT Weeks in Treatment: 40 Vital Signs Time Taken: 08:25 Temperature (F): 98.5 Height (in): 73 Pulse (bpm): 89 Weight (lbs): 210 Respiratory Rate (breaths/min): 16 Body Mass Index (BMI):  27.7 Blood Pressure (mmHg): 134/80 Reference Range: 80 - 120 mg / dl Airway Electronic Signature(s) Signed: 09/06/2018 3:36:08 PM By: Lorine Bears RCP, RRT, CHT Entered By: Becky Sax, Amado Nash on 09/06/2018 08:28:52

## 2018-09-08 ENCOUNTER — Other Ambulatory Visit: Payer: Self-pay

## 2018-09-08 ENCOUNTER — Encounter: Payer: Self-pay | Admitting: General Surgery

## 2018-09-08 ENCOUNTER — Telehealth: Payer: Self-pay | Admitting: Licensed Clinical Social Worker

## 2018-09-08 ENCOUNTER — Ambulatory Visit (INDEPENDENT_AMBULATORY_CARE_PROVIDER_SITE_OTHER): Payer: Medicare Other | Admitting: General Surgery

## 2018-09-08 VITALS — BP 134/76 | HR 87 | Temp 97.3°F | Resp 20 | Ht 73.0 in | Wt 200.0 lb

## 2018-09-08 DIAGNOSIS — L89154 Pressure ulcer of sacral region, stage 4: Secondary | ICD-10-CM

## 2018-09-08 NOTE — Telephone Encounter (Signed)
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 Fax # [phone]

## 2018-09-08 NOTE — Telephone Encounter (Signed)
I will not be able to write that as they will have to follow their isolation protocol and should ask their MD /NP to do that. thx

## 2018-09-08 NOTE — Patient Instructions (Addendum)
The patient is aware to call back for any questions or new concerns. Continue wound vac

## 2018-09-08 NOTE — Telephone Encounter (Signed)
Ok I will call and let them know.

## 2018-09-08 NOTE — Progress Notes (Signed)
GUERIN, LASHOMB ([id number]) Visit Report for 09/06/2018 Chief Complaint Document Details Patient Name: [name] Date of Service: 09/06/2018 8:30 AM Medical Record Number: [id number] Patient Account Number: [id number] Date of Birth/Sex: Jun 24, 1951 (68 y.o. M) Treating RN: Harold Barban Primary Care Provider: [name] Other Clinician: Referring Provider: [name] Treating Provider/Extender: [name], HOYT Weeks in Treatment: 40 Information Obtained from: Patient Chief Complaint Right gluteal fold ulcer Electronic Signature(s) Signed: 09/07/2018 12:02:18 AM By: Worthy Keeler [name] Entered By: Worthy Keeler on 09/06/2018 09:06:11 [name] ([id number]) -------------------------------------------------------------------------------- Debridement Details Patient Name: [name] Date of Service: 09/06/2018 8:30 AM Medical Record Number: [id number] Patient Account Number: [id number] Date of Birth/Sex: Jan 03, 1951 (68 y.o. M) Treating RN: Harold Barban Primary Care Provider: [name] Other Clinician: Referring Provider: [name] Treating Provider/Extender: [name], HOYT Weeks in Treatment: 40 Debridement Performed for Wound #1 Right Gluteal fold Assessment: Performed By: Physician [name], HOYT E., [name] Debridement Type: Debridement Level of Consciousness (Pre- Awake and Alert procedure): Pre-procedure Verification/Time Yes - 08:45 Out Taken: Start Time: 08:45 Pain Control: Lidocaine Total Area Debrided (L x W): 4 (cm) x 1.5 (cm) = 6 (cm) Tissue and other material Viable, Non-Viable, Slough, Subcutaneous, Slough debrided: Level: Skin/Subcutaneous Tissue Debridement Description: Excisional Instrument: Curette Bleeding: Minimum Hemostasis Achieved: Pressure End Time: 08:48 Procedural Pain: 0 Post Procedural Pain: 0 Response to Treatment: Procedure was tolerated well Level of Consciousness Awake  and Alert (Post-procedure): Post Debridement Measurements of Total Wound Length: (cm) 4 Stage: Category/Stage IV Width: (cm) 1.5 Depth: (cm) 0.3 Volume: (cm) 1.414 Character of Wound/Ulcer Post Improved Debridement: Post Procedure Diagnosis Same as Pre-procedure Electronic Signature(s) Signed: 09/06/2018 9:36:27 AM By: Harold Barban Signed: 09/07/2018 12:02:18 AM By: Worthy Keeler [name] Entered By: Harold Barban on 09/06/2018 08:46:43 [name] ([id number]) -------------------------------------------------------------------------------- HPI Details Patient Name: [name] Date of Service: 09/06/2018 8:30 AM Medical Record Number: [id number] Patient Account Number: [id number] Date of Birth/Sex: 06-Jul-1951 (68 y.o. 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 He states that there is no history of diabetes that he is aware of. He does have issues with his prostate and is currently receiving treatment for this by way of oral medication. With that being said I do not have a lot of details in that regard. Nonetheless the patient presents today as a result of having been referred to [place] by another provider initially home health was set to come out and take care of his wound although due to the fact that he apparently drives he's not able to receive home health. His wife is therefore trying to help take care of this wound within although they have been struggling with what exactly to do at this point. She states that she  can do some things but she is definitely not a nurse and does have some issues with looking at blood. The good news is the wound does not appear to be too deep and is fairly superficial at this point. There is no slough noted there is some nonviable skin noted around the surface of the wound  and the perimeter at this point. The central portion of the wound appears to be very good with a dermal layer noted this does not appear to be again deep enough to extend it to subcutaneous tissue at this point. 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 In the pump he has Dilaudid and baclofen. 12/07/17 on evaluation today patient presents for follow-up concerning his ongoing lower back thigh ulcer on the right. He states that he did not get the supplies ordered and therefore has not really been able to perform the dressing changes as directed exactly. His wife was able to get some Boarder Foam Dressing's from the drugstore and subsequently has been using hydrogel which did help to a degree in the wound does appear to be able smaller. There is actually more drainage this week noted than previous. 12/21/17 on evaluation today patient appears to be doing rather well in regard to his right gluteal ulcer. He has been tolerating the dressing changes without complication. There does not appear to be any evidence of infection at this point in time. Overall the wound does seem to be making some progress as far as the edges are concerned there's not as much in the way of overlapping of the external wound edges and he has a good epithelium to wound bed border for the most part. This however is not true right at the 12 o'clock location over the span of a little over a centimeters which actually will require debridement today to clean this away and hopefully allow it to continue to heal more appropriately. 12/28/17 on  evaluation today patient appears to be doing rather well in regard to his ulcer in the left gluteal region. He's been tolerating the dressing changes without complication. Apparently he has had some difficulty getting his dressing material. Apparently there's been some confusion with ordering we're gonna check into this. Nonetheless overall he's been showing signs of improvement which is good news. Debridement is not required today. 01/04/18 on evaluation today patient presents for follow-up concerning his right gluteal ulcer. He has been tolerating the dressing changes fairly well. On inspection today it appears he may actually have some maceration them concerned about the fact that he may be developing too much moisture in and around the wound bed which can cause delay in healing. With that being said he unfortunately really has not showed significant signs of improvement since last week's evaluation in fact this may even be just the little bit/slightly larger. Nonetheless he's been having a lot of discomfort I'm not sure this is even related to the wound as he has no pain when I'm to breeding or otherwise cleaning the wound during evaluation today. Nonetheless this is something that we did recommend he talked to his pain specialist concerning. 01/11/18 on evaluation today patient appears to be doing better in regard to his ulceration. He has been tolerating the dressing changes without complication. With that being said overall there's no evidence of infection which is good news. The only thing is he did receive the hatch affair blue classic versus the ready nonetheless I feel like this is perfectly fine and appears to have done well for him over the past week. 01/25/18 on evaluation today patient's wound actually appears to be a little bit larger than during the last evaluation. The good BREVYN, RING. ([id number]) news is the majority of the wound edges  actually appear to be fairly firmly  attached to the wound bed unfortunately again we're not really making progress in regard to the size. Roughly the wound is about the same size as when I first saw him although again the wound margin/edges appear to be much better. 02/01/18 on evaluation today patient actually appears to be doing very well in regard to his wound. Applying the Prisma dry does seem to be better although he does still have issues with slow progression of the wound. There was a slight improvement compared to last week's measurements today. Nonetheless I have been considering other options as far as the possibility of Theraskin or even a snap vac. In general I'm not sure that the Theraskin due to location of the wound would be a very good idea. Nonetheless I do think that a snap vac could be a possibility for the patient and in fact I think this could even be an excellent way to manage the wound possibly seeing some improvement in a very rapid fashion here. Nonetheless this is something that we would need to get approved and I did have a lengthy conversation with the patient about this today. 02/08/18 on evaluation today patient appears to be doing a little better in regard to his ulcer. He has been tolerating the dressing changes without complication. Fortunately despite the fact that the wound is a little bit smaller it's not significantly so unfortunately. We have discussed the possibility of a snap vac we did check with insurance this is actually covered at this point. Fortunately there does not appear to be any sign of infection. Overall I'm fairly pleased with how things seem to be appearing at this point. 02/15/18 on evaluation today patient appears to be doing rather well in regard to his right gluteal ulcer. Unfortunately the snap vac did not stay in place with his sheer and friction this came loose and did not seem to maintain seal very well. He worked for about two days and it did seem to do very well during that  time according to his wife but in general this does not seem to be something that's gonna be beneficial for him long-term. I do believe we need to go back to standard dressings to see if we can find something that will be of benefit. 03/02/18- He is here in follow up evaluation; there is minimal change in the wound. He will continue with the same treatment plan, would consider changing to iodosrob/iodoflex if ulcer continues to to plateau. He will follow up next week 03/08/18 on evaluation today patient's wound actually appears to be about the same size as when I previously saw him several weeks back. Unfortunately he does have some slightly dark discoloration in the central portion of the wound which has me concerned about pressure injury. I do believe he may be sitting for too long a period of time in fact he tells me that "I probably sit for much too long". He does have some Slough noted on the surface of the wound and again as far as the size of the wound is concerned I'm really not seeing anything that seems to have improved significantly. 03/15/18 on evaluation today patient appears to be doing fairly well in regard to his ulcer. The wound measured pretty much about the same today compared to last week's evaluation when looking at his graph. With that being said the area of bruising/deep tissue injury that was noted last week I do not see at this point.  He did get a new cushion fortunately this does seem to be have been of benefit in my pinion. It does appear that he's been off of this more which is good news as well I think that is definitely showing in the overall wound measurements. With that being said I do believe that he needs to continue to offload I don't think that the fact this is doing better should be or is going to allow him to not have to offload and explain this to him as well. Overall he seems to be in agreement the plan I think he understands. The overall appearance of the wound  bed is improved compared to last week I think the Iodoflex has been beneficial in that regard. 03/29/18 on evaluation today patient actually appears to be doing rather well in regard to his wound from the overall appearance standpoint he does have some granulation although there's some Slough on the surface of the wound noted as well. With that being said he unfortunately has not improved in regard to the overall measurement of the wound in volume or in size. I did have a discussion with him very specifically about offloading today. He actually does work although he mainly is just sitting throughout the day. He tells me he offloads by "lifting himself up for 30 seconds off of his chair occasionally" purchase from [employer] which does seem to have helped. And he has a new cushion that he with that being said he's also able to stand some for a very short period of time but not significant enough I think to provide appropriate offloading. I think the biggest issue at this point with the wound and the fact is not healing as quickly as we would like is due to the fact that he is really not able to appropriately offload while at work. He states the beginning after his injury he actually had a bed at his job that he could lay on in order to offload and that does seem to have been of help back at that time. Nonetheless he had not done this in quite some time unfortunately. I think that could be helpful for him this is something I would like for him to look into. 04/05/18 on evaluation today patient actually presents for follow-up concerning his right gluteal ulcer. Again he really is not significantly improved even compared to last week. He has been tolerating the dressing changes without complication. With that being said fortunately there appears to be no evidence of infection at this time. He has been more proactive in trying to offload. [name], [name] ([id number]) 04/12/18 on evaluation today  patient actually appears to be doing a little better in regard to his wound and the right gluteal fold region. He's been tolerating the dressing changes since removing the oasis without complication. However he was having a lot of burning initially with the oasis in place. He's unsure of exactly why this was given so much discomfort but he assumes that it was the oasis itself causing the problem. Nonetheless this had to be removed after about three days in place although even those three days seem to have made a fairly good improvement in regard to the overall appearance of the wound bed. In fact is the first time that he's made any improvement from the standpoint of measurements in about six weeks. He continues to have no discomfort over the area of the wound itself which leads me to wonder why he was having the  burning with the oasis when he does not even feel the actual debridement's themselves. I am somewhat perplexed by this. 04/19/18 on evaluation today patient's wound actually appears to be showing signs of epithelialization around the edge of the wound and in general actually appears to be doing better which is good news. He did have the same burning after about three days with applying the Endoform last week in the same fashion that I would generally apply a skin substitute. This seems to indicate that it's not the oasis to cause the problem but potentially the moisture buildup that just causes things to burn or there may be some other reaction with the skin prep or Steri-Strips. Nonetheless I'm not sure that is gonna be able to tolerate any skin substitute for a long period of time. The good news is the wound actually appears to be doing better today compared to last week and does seem to finally be making some progress. 04/26/18 on evaluation today patient actually appears to be doing rather well in regard to his ulcer in the right gluteal fold. He has been tolerating the dressing changes  without complication which is good news. The Endoform does seem to be helping although he was a little bit more macerated this week. This seems to be an ongoing issue with fluid control at this point. Nonetheless I think we may be able to add something like Drawtex to help control the drainage. 05/03/18 on evaluation today patient appears to actually be doing better in regard to the overall appearance of his wound. He has been tolerating the dressing changes without complication. Fortunately there appears to be no evidence of infection at this time. I really feel like his wound has shown signs as of today of turning around last week I thought so as well and definitely he could be seen in this week's overall appearance and measurements. In general I'm very pleased with the fact that he finally seems to be making a steady but sure progress. The patient likewise is very pleased. 05/17/18 on evaluation today patient appears to be doing more poorly unfortunately in regard to his ulcer. He has been tolerating the dressing changes without complication. With that being said he tells me that in the past couple of days he and his wife have noticed that we did not seem to be doing quite as well is getting dark near the center. Subsequently upon evaluation today the wound actually does appear to be doing worse compared to previous. He has been tolerating the dressing changes otherwise and he states that he is not been sitting up anymore than he was in the past from what he tells me. Still he has continued to work he states "I'm tired of dealing with this and if I have to just go home and lay in the bed all the time that's what I'll do". Nonetheless I am concerned about the fact that this wound does appear to be deeper than what it was previous. 05/24/18 upon evaluation today patient actually presents after having been in the hospital due to what was presumed to be sepsis secondary to the wound infection. He had  an elevated white blood cell count between 14 and 15. With that being said he does seem to be doing somewhat better now. His wound still is giving him some trouble nonetheless and he is obviously concerned about the fact likely talked about that this does seem to go more deeply than previously noted. I did review his wound  culture which showed evidence of Staphylococcus aureus him and group B strep. Nonetheless he is on antibiotics, Levaquin, for this. Subsequently I did review his intake summary from the hospital as well. I also did look at the CT of the lumbar spine with contrast that was performed which showed no bone destruction to suggest lumbar disguises/osteomyelitis or sacral osteomyelitis. There was no paraspinal abscess. Nonetheless it appears this may have been more of just a soft tissue infection at this point which is good news. He still is nonetheless concerned about the wound which again I think is completely reasonable considering everything he's been through recently. 05/31/18 on evaluation today on evaluation today patient actually appears to be showing signs of his wound be a little bit deeper than what I would like to see. Fortunately he does not show any signs of significant infection although his temperature was 99 today he states he's been checking this at home and has not been elevated. Nonetheless with the undermining that I'm seeing at this point I am becoming more concerned about the wound I do think that offloading is a key factor here that is preventing the speedy recovery at this point. There does not appear to be any evidence of again over infection noted. He's been using Santyl currently. 06/07/18 the patient presents today for follow-up evaluation regarding the left ulcer in the gluteal region. He has been tolerating the Wound VAC fairly well. He is obviously very frustrated with this he states that to mean is really getting in his way. There does not appear to be  any evidence of infection at this time he does have a little bit of odor I do not necessarily associate this with infection just something that we sometimes notice with Wound VAC therapy. With that being said I can definitely catch a tone of discontentment overall in the patient's demeanor today. This when he was previously in the hospital KARIEM, WOLFSON. ([id number]) an CT scan was done of the lumbar region which did not reveal any signs of osteomyelitis. With that being said the pelvis in particular was not evaluated distinctly which means he could still have some osteonecrosis I. Nonetheless the Wound VAC was started on Thursday I do want to get this little bit more time before jumping to a CT scan of the pelvis although that is something that I might would recommend if were not see an improvement by that time. 06/14/18 on evaluation today patient actually appears to be doing about the same in regard to his right gluteal ulcer. Again he did have a CT scan of the lumbar spine unfortunately this did not include the pelvis. Nonetheless with the depth of the wound that I'm seeing today even despite the fact that I'm not seeing any evidence of overt cellulitis I believe there's a good chance that we may be dealing with osteomyelitis somewhere in the right Ischial region. No fevers, chills, nausea, or vomiting noted at this time. 06/21/18 on evaluation today patient actually appears to be doing about the same with regard to his wound. The tunnel at 6 o'clock really does not appear to be any deeper although it is a little bit wider. I think at this point you may want to start packing this with white phone. Unfortunately I have not got approval for the CT scan of the pelvis as of yet due to the fact that Medicare apparently has been denied it due to the diagnosis codes not being appropriate according to Medicare for the  test requested. With that being said the patient cannot have an MRI and therefore  this is the only option that we have as far as testing is concerned. The patient has had infection and was on antibiotics and been added code for cellulitis of the bottom to see if this will be appropriate for getting the test approved. Nonetheless I'm concerned about the infection have been spread deeper into the Ischial region. 06/28/18 on evaluation today patient actually appears to be doing rather well all things considered in regard to the right gluteal ulcer. He has been tolerating the dressing changes without complication. With that being said the Wound VAC he states does have to be replaced almost every day or at least reinforced unfortunately. Patient actually has his CT scan later this morning we should have the results by tomorrow. 07/05/18 on evaluation today patient presents for follow-up concerning his right Ischial ulcer. He did see the surgeon Dr. Lysle Pearl last week. They were actually very happy with him and felt like he spent a tremendous amount of time with them as far as discussing his situation was concerned. In the end Dr. Lysle Pearl did contact me as well and determine that he would not recommend any surgical intervention at this point as he felt like it would not be in the patient's best interest based on what he was seeing. He recommended a referral to infectious disease. Subsequently this is something that Dr. Ines Bloomer office is working on setting up for the patient. As far as evaluation today is concerned the patient's wound actually appears to be worse at this point. I am concerned about how things are progressing and specifically about infection. I do not feel like it's the deeper but the area of depth is definitely widening which does have me concerned. No fevers, chills, nausea, or vomiting noted at this time. I think that we do need initiate antibiotic therapy the patient has an allow allergy to amoxicillin/penicillin he states that he gets a rash since childhood. Nonetheless  she's never had the issues with Catholics or cephalosporins in general but he is aware of. 07/27/18 on evaluation today patient presents following admission to the hospital on 07/09/18. He was subsequently discharged on 07/20/18. On 07/15/18 the patient underwent irrigation and debridement was soft tissue biopsy and bone biopsy as well as placement of a Wound VAC in the OR by Dr. Celine Ahr. During the hospital course the patient was placed on a Wound VAC and recommended follow up with surgery in three weeks actually with Dr. Delaine Lame who is infectious disease. The patient was on vancomycin during the hospital course. 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 He also had a bone culture which revealed evidence of methicillin-resistant staph aureus. He is updated CT scan 07/09/18 reveals that he had progression of the which was performed on wound to breakdown down to the trochanter where he actually had irregularities there as well suggestive of osteomyelitis. This was a change just since 9 December when we last performed a CT scan. Obviously this one had gone downhill quite significantly and rapidly. At this point upon evaluation I feel like in general the patient's wound seems to be doing fairly well all things considered upon my evaluation today. Obviously this is larger and deeper than what I previously evaluated but at the same time he seems to be making some progress as far as the appearance of the granulation tissue is concerned. I'm happy in that regard. No fevers, chills,  nausea, or vomiting noted at this time. He is on IV vancomycin and Rocephin at the facility. He is currently in [facility]. 08/03/18 upon evaluation today patient's wound appears to be doing better in regard to the overall appearance at this point in time. Fortunately he's been tolerating the Wound VAC without complication and states that the facility has been taking excellent care of  the wound site. Overall I see some Slough noted on the surface which I am going to attempt sharp debridement today of but nonetheless other than this I feel like he's making progress. 08/09/18 on evaluation today patient's wound appears to be doing much better compared to even last week's evaluation. Do believe that the Wound VAC is been of great benefit for him. He has been tolerating the dressing changes that is the Wound TIMM, BONENBERGER. ([id number]) VAC without any complication and he has excellent granulation noted currently. There is no need for sharp debridement at this point. 08/16/18 on evaluation today patient actually appears to be doing very well in regard to the wound in the right gluteal fold region. This is showing signs of progress and again appears to be very healthy which is excellent news. Fortunately there is no sign of active infection by way of odor or drainage at this point. Overall I'm very pleased with how things stand. He seems to be tolerating the Wound VAC without complication. 08/23/18 on evaluation today patient actually appears to be doing better in regard to his wound. He has been tolerating the Wound VAC without complication and in fact it has been collecting a significant amount of drainage which I think is good news especially considering how the wound appears. Fortunately there is no signs of infection at this time definitely nothing appears to be worse which is good news. He has not been started on the Bactrim and Flagyl that was recommended by Dr. Delaine Lame yet. I did actually contact her office this morning in order to check and see were things are that regard their gonna be calling me back. 08/30/18 on evaluation today patient actually appears to show signs of excellent improvement today compared to last evaluation. The undermining is getting much better the wound seems to be feeling quite nicely and I'm very pleased that the granulation in general. With  that being said overall I feel like the patient has made excellent progress which is great news. No fevers, chills, nausea, or vomiting noted at this time. 09/06/18 on evaluation today patient actually appears to be doing rather well in regard to his right gluteal ulcer. This is showing signs of improvement in overall I'm very pleased with how things seem to be progressing. The patient likewise is please. Overall I see no evidence of infection he is about to complete his oral antibiotic regimen which is the end of the antibiotics for him in just about three days. Electronic Signature(s) Signed: 09/07/2018 12:02:18 AM By: Worthy Keeler [name] Entered By: Worthy Keeler on 09/06/2018 23:39:57 MERDITH, BOYD ([id number]) -------------------------------------------------------------------------------- Physical Exam Details Patient Name: [name] Date of Service: 09/06/2018 8:30 AM Medical Record Number: [id number] Patient Account Number: [id number] Date of Birth/Sex: 1950-07-26 (68 y.o. M) Treating RN: Harold Barban Primary Care Provider: [name] Other Clinician: Referring Provider: [name] Treating Provider/Extender: [name], HOYT Weeks in Treatment: 41 Constitutional Well-nourished and well-hydrated in no acute distress. Respiratory normal breathing without difficulty. Psychiatric this patient is able to make decisions and demonstrates good insight into disease process. Alert  and Oriented x 3. pleasant and cooperative. Notes Patient's wound bed currently actually shows good granulation there is some Slough noted and I did perform sharp agreement clear away some of the necrotic material from the surface of the wound. He tolerated this with no pain post debridement the wound bed appears to be doing much better. My hope is this will stimulate and help it to continue to improve appropriately. Electronic Signature(s) Signed: 09/07/2018 12:02:18 AM By:  Worthy Keeler [name] Entered By: Worthy Keeler on 09/06/2018 23:40:25 BRNADON, EOFF ([id number]) -------------------------------------------------------------------------------- Physician Orders Details Patient Name: [name] Date of Service: 09/06/2018 8:30 AM Medical Record Number: [id number] Patient Account Number: [id number] Date of Birth/Sex: 1951/07/10 (68 y.o. 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 Primary Wound Dressing Wound #1 Right Gluteal fold o Silver Alginate - in clinic until SNF restarts NPWT Secondary Dressing Wound #1 Right Gluteal fold o Boardered Foam Dressing Dressing Change Frequency Wound #1 Right Gluteal fold o Change Dressing Monday, Wednesday, Friday - and as needed Follow-up Appointments Wound #1 Right Gluteal fold o Return Appointment in 1 week. Off-Loading Wound #1 Right Gluteal fold o Mattress - Please continue air mattress at SNF o Turn and reposition every 2 hours Negative Pressure Wound Therapy Wound #1 Right Gluteal fold [place], Jojuan J. ([id number]) o Wound VAC settings at 125/130 mmHg continuous pressure. Use BLACK/GREEN foam to wound cavity. Use WHITE foam to fill any tunnel/s and/or undermining. Change VAC  dressing 3 X WEEK. Change canister as indicated when full. Nurse may titrate settings and frequency of dressing changes as clinically indicated. - Apply Silver Collagen to line the base of the wound. White foam to any tunnels o Home Health Nurse may d/c VAC for s/s of increased infection, significant wound regression, or uncontrolled drainage. [place] at [phone]. o Number of foam/gauze pieces used in the dressing = Medications-please add to medication list. Wound #1 Right Gluteal fold o Other: - IV antibiotics, difucan PO Electronic Signature(s) Signed: 09/06/2018 9:36:27 AM By: Harold Barban Signed: 09/07/2018 12:02:18 AM By: Worthy Keeler [name] Entered By: Harold Barban on 09/06/2018 08:52:22 EDMAN, LIPSEY ([id number]) -------------------------------------------------------------------------------- Problem List Details Patient Name: [name] Date of Service: 09/06/2018 8:30 AM Medical Record Number: [id number] Patient Account Number: [id number] Date of Birth/Sex: [birth year] (68 y.o. M) Treating RN: Harold Barban Primary Care Provider: [name] Other Clinician: Referring Provider: [name] Treating Provider/Extender: [name], HOYT Weeks in Treatment: 40 Active Problems ICD-10 Evaluated Encounter Code Description Active Date Today Diagnosis L89.314 Pressure ulcer of right buttock, stage 4 11/30/2017 No Yes L03.317 Cellulitis of buttock 06/21/2018 No Yes G82.20 Paraplegia, unspecified 11/30/2017 No Yes S34.109S Unspecified injury to unspecified level of lumbar spinal cord, 11/30/2017 No Yes sequela I10 Essential (primary) hypertension 11/30/2017 No Yes Inactive Problems Resolved Problems Electronic Signature(s) Signed: 09/07/2018 12:02:18 AM By: Worthy Keeler [name] Entered By: Worthy Keeler  on 09/06/2018 08:40:37 RASHAAN, WYLES  ([id number]) -------------------------------------------------------------------------------- Progress Note Details Patient Name: KEANTE, URIZAR Date of Service: 09/06/2018 8:30 AM Medical Record Number: [id number] Patient Account Number: [id number] Date of Birth/Sex: 12-04-1950 (68 y.o. 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 He states that there is no history of diabetes that he is aware of. He does have issues with his prostate and is currently receiving treatment for this by way of oral medication. With that being said I do not have a lot of details in that regard. Nonetheless the patient presents today as a result of having been referred to [place] by another provider initially home health was set to come out and take care of his wound although due to the fact that he apparently drives he's not able to receive home health. His wife is therefore trying to help take care of this wound within although they have been struggling with what exactly to do at this point. She states that she can do some things but she is definitely not a nurse and does have some issues with looking at blood. The good news is the wound does not appear to be too deep and is fairly superficial at this point. There is no slough noted there is some nonviable skin noted around the surface of the wound and the perimeter at this point. The central portion of the wound appears to be very good with a  dermal layer noted this does not appear to be again deep enough to extend it to subcutaneous tissue at this point. 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 In the pump he has Dilaudid and baclofen. 12/07/17 on evaluation today patient presents for follow-up concerning his ongoing lower back thigh ulcer on the right. He states that he did not get the supplies ordered and therefore has not really been able to perform the dressing changes as directed exactly. His wife was able to get some Boarder Foam Dressing's from the drugstore and subsequently has been using hydrogel which did help to a degree in the wound does appear to be able smaller. There is actually more drainage this week noted than previous. 12/21/17 on evaluation today patient appears to be doing rather well in regard to his right gluteal ulcer. He has been tolerating the dressing changes without complication. There does not appear to be any evidence of infection at this point in time. Overall the wound does seem to be making some progress as far as the edges are concerned there's not as much in the way of overlapping of the external wound edges and he has a good epithelium to wound bed border for the most part. This however is not true right at the 12 o'clock location over the span of a little over a centimeters which actually will require debridement today to clean this away and hopefully allow it to continue to heal more appropriately. 12/28/17 on evaluation today patient appears  to be doing rather well in regard to his ulcer in the left gluteal region. He's been tolerating the dressing changes without complication. Apparently he has had some difficulty getting his dressing material. Apparently there's been some confusion with ordering we're gonna check into this. Nonetheless overall he's been showing signs of improvement which is  good news. Debridement is not required today. 01/04/18 on evaluation today patient presents for follow-up concerning his right gluteal ulcer. He has been tolerating the dressing changes fairly well. On inspection today it appears he may actually have some maceration them concerned about the fact that he may be developing too much moisture in and around the wound bed which can cause delay in healing. With that being said he unfortunately really has not showed significant signs of improvement since last week's evaluation in fact this may even be just the little bit/slightly larger. Nonetheless he's been having a lot of discomfort I'm not sure this is even related to the wound as he has no pain when I'm to breeding or otherwise cleaning the wound during evaluation today. Nonetheless this is something that we did recommend he talked to his pain specialist concerning. 01/11/18 on evaluation today patient appears to be doing better in regard to his ulceration. He has been tolerating the dressing Reierson, Kyion J. ([id number]) changes without complication. With that being said overall there's no evidence of infection which is good news. The only thing is he did receive the hatch affair blue classic versus the ready nonetheless I feel like this is perfectly fine and appears to have done well for him over the past week. 01/25/18 on evaluation today patient's wound actually appears to be a little bit larger than during the last evaluation. The good news is the majority of the wound edges actually appear to be fairly firmly attached to the wound bed unfortunately again we're not really making progress in regard to the size. Roughly the wound is about the same size as when I first saw him although again the wound margin/edges appear to be much better. 02/01/18 on evaluation today patient actually appears to be doing very well in regard to his wound. Applying the Prisma dry does seem to be better although he does  still have issues with slow progression of the wound. There was a slight improvement compared to last week's measurements today. Nonetheless I have been considering other options as far as the possibility of Theraskin or even a snap vac. In general I'm not sure that the Theraskin due to location of the wound would be a very good idea. Nonetheless I do think that a snap vac could be a possibility for the patient and in fact I think this could even be an excellent way to manage the wound possibly seeing some improvement in a very rapid fashion here. Nonetheless this is something that we would need to get approved and I did have a lengthy conversation with the patient about this today. 02/08/18 on evaluation today patient appears to be doing a little better in regard to his ulcer. He has been tolerating the dressing changes without complication. Fortunately despite the fact that the wound is a little bit smaller it's not significantly so unfortunately. We have discussed the possibility of a snap vac we did check with insurance this is actually covered at this point. Fortunately there does not appear to be any sign of infection. Overall I'm fairly pleased with how things seem to be appearing at this point. 02/15/18 on evaluation today  patient appears to be doing rather well in regard to his right gluteal ulcer. Unfortunately the snap vac did not stay in place with his sheer and friction this came loose and did not seem to maintain seal very well. He worked for about two days and it did seem to do very well during that time according to his wife but in general this does not seem to be something that's gonna be beneficial for him long-term. I do believe we need to go back to standard dressings to see if we can find something that will be of benefit. 03/02/18- He is here in follow up evaluation; there is minimal change in the wound. He will continue with the same treatment plan, would consider changing to  iodosrob/iodoflex if ulcer continues to to plateau. He will follow up next week 03/08/18 on evaluation today patient's wound actually appears to be about the same size as when I previously saw him several weeks back. Unfortunately he does have some slightly dark discoloration in the central portion of the wound which has me concerned about pressure injury. I do believe he may be sitting for too long a period of time in fact he tells me that "I probably sit for much too long". He does have some Slough noted on the surface of the wound and again as far as the size of the wound is concerned I'm really not seeing anything that seems to have improved significantly. 03/15/18 on evaluation today patient appears to be doing fairly well in regard to his ulcer. The wound measured pretty much about the same today compared to last week's evaluation when looking at his graph. With that being said the area of bruising/deep tissue injury that was noted last week I do not see at this point. He did get a new cushion fortunately this does seem to be have been of benefit in my pinion. It does appear that he's been off of this more which is good news as well I think that is definitely showing in the overall wound measurements. With that being said I do believe that he needs to continue to offload I don't think that the fact this is doing better should be or is going to allow him to not have to offload and explain this to him as well. Overall he seems to be in agreement the plan I think he understands. The overall appearance of the wound bed is improved compared to last week I think the Iodoflex has been beneficial in that regard. 03/29/18 on evaluation today patient actually appears to be doing rather well in regard to his wound from the overall appearance standpoint he does have some granulation although there's some Slough on the surface of the wound noted as well. With that being said he unfortunately has not improved in  regard to the overall measurement of the wound in volume or in size. I did have a discussion with him very specifically about offloading today. He actually does work although he mainly is just sitting throughout the day. He tells me he offloads by "lifting himself up for 30 seconds off of his chair occasionally" purchase from [employer] which does seem to have helped. And he has a new cushion that he with that being said he's also able to stand some for a very short period of time but not significant enough I think to provide appropriate offloading. I think the biggest issue at this point with the wound and the fact is not healing  as quickly as we would like is due to the fact that he is really not able to appropriately offload while at work. He states the beginning after his injury he actually had a bed at his job that he could lay on in order to offload and that does seem to have been of help back at that time. Nonetheless he had not done this in quite some time unfortunately. I think that could be helpful for him this is something I would like for him to look into. GOKU, HARB ([id number]) 04/05/18 on evaluation today patient actually presents for follow-up concerning his right gluteal ulcer. Again he really is not significantly improved even compared to last week. He has been tolerating the dressing changes without complication. With that being said fortunately there appears to be no evidence of infection at this time. He has been more proactive in trying to offload. 04/12/18 on evaluation today patient actually appears to be doing a little better in regard to his wound and the right gluteal fold region. He's been tolerating the dressing changes since removing the oasis without complication. However he was having a lot of burning initially with the oasis in place. He's unsure of exactly why this was given so much discomfort but he assumes that it was the oasis itself causing the  problem. Nonetheless this had to be removed after about three days in place although even those three days seem to have made a fairly good improvement in regard to the overall appearance of the wound bed. In fact is the first time that he's made any improvement from the standpoint of measurements in about six weeks. He continues to have no discomfort over the area of the wound itself which leads me to wonder why he was having the burning with the oasis when he does not even feel the actual debridement's themselves. I am somewhat perplexed by this. 04/19/18 on evaluation today patient's wound actually appears to be showing signs of epithelialization around the edge of the wound and in general actually appears to be doing better which is good news. He did have the same burning after about three days with applying the Endoform last week in the same fashion that I would generally apply a skin substitute. This seems to indicate that it's not the oasis to cause the problem but potentially the moisture buildup that just causes things to burn or there may be some other reaction with the skin prep or Steri-Strips. Nonetheless I'm not sure that is gonna be able to tolerate any skin substitute for a long period of time. The good news is the wound actually appears to be doing better today compared to last week and does seem to finally be making some progress. 04/26/18 on evaluation today patient actually appears to be doing rather well in regard to his ulcer in the right gluteal fold. He has been tolerating the dressing changes without complication which is good news. The Endoform does seem to be helping although he was a little bit more macerated this week. This seems to be an ongoing issue with fluid control at this point. Nonetheless I think we may be able to add something like Drawtex to help control the drainage. 05/03/18 on evaluation today patient appears to actually be doing better in regard to the overall  appearance of his wound. He has been tolerating the dressing changes without complication. Fortunately there appears to be no evidence of infection at this time. I really feel like his wound  has shown signs as of today of turning around last week I thought so as well and definitely he could be seen in this week's overall appearance and measurements. In general I'm very pleased with the fact that he finally seems to be making a steady but sure progress. The patient likewise is very pleased. 05/17/18 on evaluation today patient appears to be doing more poorly unfortunately in regard to his ulcer. He has been tolerating the dressing changes without complication. With that being said he tells me that in the past couple of days he and his wife have noticed that we did not seem to be doing quite as well is getting dark near the center. Subsequently upon evaluation today the wound actually does appear to be doing worse compared to previous. He has been tolerating the dressing changes otherwise and he states that he is not been sitting up anymore than he was in the past from what he tells me. Still he has continued to work he states "I'm tired of dealing with this and if I have to just go home and lay in the bed all the time that's what I'll do". Nonetheless I am concerned about the fact that this wound does appear to be deeper than what it was previous. 05/24/18 upon evaluation today patient actually presents after having been in the hospital due to what was presumed to be sepsis secondary to the wound infection. He had an elevated white blood cell count between 14 and 15. With that being said he does seem to be doing somewhat better now. His wound still is giving him some trouble nonetheless and he is obviously concerned about the fact likely talked about that this does seem to go more deeply than previously noted. I did review his wound culture which showed evidence of Staphylococcus aureus him and group  B strep. Nonetheless he is on antibiotics, Levaquin, for this. Subsequently I did review his intake summary from the hospital as well. I also did look at the CT of the lumbar spine with contrast that was performed which showed no bone destruction to suggest lumbar disguises/osteomyelitis or sacral osteomyelitis. There was no paraspinal abscess. Nonetheless it appears this may have been more of just a soft tissue infection at this point which is good news. He still is nonetheless concerned about the wound which again I think is completely reasonable considering everything he's been through recently. 05/31/18 on evaluation today on evaluation today patient actually appears to be showing signs of his wound be a little bit deeper than what I would like to see. Fortunately he does not show any signs of significant infection although his temperature was 99 today he states he's been checking this at home and has not been elevated. Nonetheless with the undermining that I'm seeing at this point I am becoming more concerned about the wound I do think that offloading is a key factor here that is preventing the speedy recovery at this point. There does not appear to be any evidence of again over infection noted. He's been using Santyl currently. SHAQUELLE, HERNON ([id number]) 06/07/18 the patient presents today for follow-up evaluation regarding the left ulcer in the gluteal region. He has been tolerating the Wound VAC fairly well. He is obviously very frustrated with this he states that to mean is really getting in his way. There does not appear to be any evidence of infection at this time he does have a little bit of odor I do not necessarily associate this  with infection just something that we sometimes notice with Wound VAC therapy. With that being said I can definitely catch a tone of discontentment overall in the patient's demeanor today. This when he was previously in the hospital an CT scan was done  of the lumbar region which did not reveal any signs of osteomyelitis. With that being said the pelvis in particular was not evaluated distinctly which means he could still have some osteonecrosis I. Nonetheless the Wound VAC was started on Thursday I do want to get this little bit more time before jumping to a CT scan of the pelvis although that is something that I might would recommend if were not see an improvement by that time. 06/14/18 on evaluation today patient actually appears to be doing about the same in regard to his right gluteal ulcer. Again he did have a CT scan of the lumbar spine unfortunately this did not include the pelvis. Nonetheless with the depth of the wound that I'm seeing today even despite the fact that I'm not seeing any evidence of overt cellulitis I believe there's a good chance that we may be dealing with osteomyelitis somewhere in the right Ischial region. No fevers, chills, nausea, or vomiting noted at this time. 06/21/18 on evaluation today patient actually appears to be doing about the same with regard to his wound. The tunnel at 6 o'clock really does not appear to be any deeper although it is a little bit wider. I think at this point you may want to start packing this with white phone. Unfortunately I have not got approval for the CT scan of the pelvis as of yet due to the fact that Medicare apparently has been denied it due to the diagnosis codes not being appropriate according to Medicare for the test requested. With that being said the patient cannot have an MRI and therefore this is the only option that we have as far as testing is concerned. The patient has had infection and was on antibiotics and been added code for cellulitis of the bottom to see if this will be appropriate for getting the test approved. Nonetheless I'm concerned about the infection have been spread deeper into the Ischial region. 06/28/18 on evaluation today patient actually appears to be  doing rather well all things considered in regard to the right gluteal ulcer. He has been tolerating the dressing changes without complication. With that being said the Wound VAC he states does have to be replaced almost every day or at least reinforced unfortunately. Patient actually has his CT scan later this morning we should have the results by tomorrow. 07/05/18 on evaluation today patient presents for follow-up concerning his right Ischial ulcer. He did see the surgeon Dr. Lysle Pearl last week. They were actually very happy with him and felt like he spent a tremendous amount of time with them as far as discussing his situation was concerned. In the end Dr. Lysle Pearl did contact me as well and determine that he would not recommend any surgical intervention at this point as he felt like it would not be in the patient's best interest based on what he was seeing. He recommended a referral to infectious disease. Subsequently this is something that Dr. Ines Bloomer office is working on setting up for the patient. As far as evaluation today is concerned the patient's wound actually appears to be worse at this point. I am concerned about how things are progressing and specifically about infection. I do not feel like it's the deeper  but the area of depth is definitely widening which does have me concerned. No fevers, chills, nausea, or vomiting noted at this time. I think that we do need initiate antibiotic therapy the patient has an allow allergy to amoxicillin/penicillin he states that he gets a rash since childhood. Nonetheless she's never had the issues with Catholics or cephalosporins in general but he is aware of. 07/27/18 on evaluation today patient presents following admission to the hospital on 07/09/18. He was subsequently discharged on 07/20/18. On 07/15/18 the patient underwent irrigation and debridement was soft tissue biopsy and bone biopsy as well as placement of a Wound VAC in the OR by Dr. Celine Ahr.  During the hospital course the patient was placed on a Wound VAC and recommended follow up with surgery in three weeks actually with Dr. Delaine Lame who is infectious disease. The patient was on vancomycin during the hospital course. 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 He also had a bone culture which revealed evidence of methicillin-resistant staph aureus. He is updated CT scan 07/09/18 reveals that he had progression of the which was performed on wound to breakdown down to the trochanter where he actually had irregularities there as well suggestive of osteomyelitis. This was a change just since 9 December when we last performed a CT scan. Obviously this one had gone downhill quite significantly and rapidly. At this point upon evaluation I feel like in general the patient's wound seems to be doing fairly well all things considered upon my evaluation today. Obviously this is larger and deeper than what I previously evaluated but at the same time he seems to be making some progress as far as the appearance of the granulation tissue is concerned. I'm happy in that regard. No fevers, chills, nausea, or vomiting noted at this time. He is on IV vancomycin and Rocephin at the facility. He is currently in [facility]. 08/03/18 upon evaluation today patient's wound appears to be doing better in regard to the overall appearance at this point in time. Fortunately he's been tolerating the Wound VAC without complication and states that the facility has been taking ASHVIK, GRUNDMAN. ([id number]) excellent care of the wound site. Overall I see some Slough noted on the surface which I am going to attempt sharp debridement today of but nonetheless other than this I feel like he's making progress. 08/09/18 on evaluation today patient's wound appears to be doing much better compared to even last week's evaluation. Do believe that the Wound VAC is been of great benefit  for him. He has been tolerating the dressing changes that is the Wound VAC without any complication and he has excellent granulation noted currently. There is no need for sharp debridement at this point. 08/16/18 on evaluation today patient actually appears to be doing very well in regard to the wound in the right gluteal fold region. This is showing signs of progress and again appears to be very healthy which is excellent news. Fortunately there is no sign of active infection by way of odor or drainage at this point. Overall I'm very pleased with how things stand. He seems to be tolerating the Wound VAC without complication. 08/23/18 on evaluation today patient actually appears to be doing better in regard to his wound. He has been tolerating the Wound VAC without complication and in fact it has been collecting a significant amount of drainage which I think is good news especially considering how the wound appears. Fortunately there is no  signs of infection at this time definitely nothing appears to be worse which is good news. He has not been started on the Bactrim and Flagyl that was recommended by Dr. Delaine Lame yet. I did actually contact her office this morning in order to check and see were things are that regard their gonna be calling me back. 08/30/18 on evaluation today patient actually appears to show signs of excellent improvement today compared to last evaluation. The undermining is getting much better the wound seems to be feeling quite nicely and I'm very pleased that the granulation in general. With that being said overall I feel like the patient has made excellent progress which is great news. No fevers, chills, nausea, or vomiting noted at this time. 09/06/18 on evaluation today patient actually appears to be doing rather well in regard to his right gluteal ulcer. This is showing signs of improvement in overall I'm very pleased with how things seem to be progressing. The patient  likewise is please. Overall I see no evidence of infection he is about to complete his oral antibiotic regimen which is the end of the antibiotics for him in just about three days. Patient History Information obtained from Patient. Family History Hypertension - Father, Stroke - Mother, No family history of Cancer, Diabetes, Heart Disease, Kidney Disease, Lung Disease, Seizures, Thyroid Problems, Tuberculosis. Social History Never smoker, Marital Status - Married, Alcohol Use - Never, Drug Use - No History, Caffeine Use - Daily. Medical History Eyes Patient has history of Cataracts - both removed Denies history of Glaucoma, Optic Neuritis Ear/Nose/Mouth/Throat Denies history of Chronic sinus problems/congestion, Middle ear problems Hematologic/Lymphatic Denies history of Anemia, Hemophilia, Human Immunodeficiency Virus, Lymphedema Respiratory Denies history of Aspiration, Asthma, Chronic Obstructive Pulmonary Disease (COPD), Pneumothorax, Sleep Apnea, Tuberculosis Cardiovascular Patient has history of Hypertension - takes medication Denies history of Angina, Arrhythmia, Congestive Heart Failure, Coronary Artery Disease, Deep Vein Thrombosis, Hypotension, Myocardial Infarction, Peripheral Arterial Disease, Peripheral Venous Disease, Phlebitis, Vasculitis Gastrointestinal Denies history of Cirrhosis , Colitis, Crohn s, Hepatitis A, Hepatitis B, Hepatitis C Endocrine [name], [name]. ([id number]) Denies history of Type I Diabetes, Type II Diabetes Genitourinary Denies history of End Stage Renal Disease Immunological Denies history of Lupus Erythematosus, Raynaud s, Scleroderma Integumentary (Skin) Denies history of History of Burn, History of pressure wounds Musculoskeletal Denies history of Gout, Rheumatoid Arthritis, Osteoarthritis, Osteomyelitis Neurologic Patient has history of Paraplegia - waist down Denies history of Dementia, Neuropathy, Quadriplegia, Seizure  Disorder Oncologic Denies history of Received Chemotherapy, Received Radiation Psychiatric Denies history of Anorexia/bulimia, Confinement Anxiety Medical And Surgical History Notes Oncologic Prostate cancer- currently treated with horomone therapy Review of Systems (ROS) Constitutional Symptoms (General Health) Denies complaints or symptoms of Fever, Chills. Respiratory The patient has no complaints or symptoms. Cardiovascular The patient has no complaints or symptoms. Psychiatric The patient has no complaints or symptoms. Objective Constitutional Well-nourished and well-hydrated in no acute distress. Vitals Time Taken: 8:25 AM, Height: 73 in, Weight: 210 lbs, BMI: 27.7, Temperature: 98.5 F, Pulse: 89 bpm, Respiratory Rate: 16 breaths/min, Blood Pressure: 134/80 mmHg. Respiratory normal breathing without difficulty. Psychiatric this patient is able to make decisions and demonstrates good insight into disease process. Alert and Oriented x 3. pleasant and cooperative. General Notes: Patient's wound bed currently actually shows good granulation there is some Slough noted and I did perform sharp agreement clear away some of the necrotic material from the surface of the wound. He tolerated this with no pain post debridement the wound bed appears to  be doing much better. My hope is this will stimulate and help it to continue to improve ANTOINETTE, HASKETT. ([id number]) appropriately. Integumentary (Hair, Skin) Wound #1 status is Open. Original cause of wound was Pressure Injury. The wound is located on the Right Gluteal fold. The wound measures 4cm length x 1.5cm width x 3cm depth; 4.712cm^2 area and 14.137cm^3 volume. There is Fat Layer (Subcutaneous Tissue) Exposed exposed. There is no tunneling or undermining noted. There is a medium amount of serosanguineous drainage noted. The wound margin is epibole. There is medium (34-66%) red, pink, hyper - granulation within the wound bed.  There is a medium (34-66%) amount of necrotic tissue within the wound bed including Adherent Slough. The periwound skin appearance exhibited: Rash, Maceration. The periwound skin appearance did not exhibit: Callus, Crepitus, Excoriation, Induration, Scarring, Dry/Scaly, Atrophie Blanche, Cyanosis, Ecchymosis, Hemosiderin Staining, Mottled, Pallor, Rubor, Erythema. Periwound temperature was noted as No Abnormality. 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 There was a Excisional Skin/Subcutaneous Tissue Debridement with a total area of 6 sq cm performed by [name], HOYT E., [name]. With the following instrument(s): Curette to remove Viable and Non-Viable tissue/material. Material removed includes Subcutaneous Tissue and Slough and after achieving pain control using Lidocaine. No specimens were taken. A time out was conducted at 08:45, prior to the start of the procedure. A Minimum amount of bleeding was controlled with Pressure. The procedure was tolerated well with a pain level of 0 throughout and a pain level of 0 following the procedure. Post Debridement Measurements: 4cm length x 1.5cm width x 0.3cm depth; 1.414cm^3 volume. Post debridement Stage noted as Category/Stage IV. Character of Wound/Ulcer Post Debridement is improved. Post procedure Diagnosis Wound #1: Same as Pre-Procedure Plan Wound Cleansing: Wound #1 Right Gluteal fold: Cleanse wound with mild soap and water Anesthetic (add to Medication List): Wound #1 Right Gluteal fold: Topical Lidocaine 4% cream applied to wound bed prior to debridement (In Clinic Only). NIEL, PERETTI ([id number]) Primary Wound Dressing: Wound #1  Right Gluteal fold: Silver Alginate - in clinic until SNF restarts NPWT Secondary Dressing: Wound #1 Right Gluteal fold: Boardered Foam Dressing Dressing Change Frequency: Wound #1 Right Gluteal fold: Change Dressing Monday, Wednesday, Friday - and as needed Follow-up Appointments: Wound #1 Right Gluteal fold: Return Appointment in 1 week. Off-Loading: Wound #1 Right Gluteal fold: Mattress - Please continue air mattress at SNF Turn and reposition every 2 hours Negative Pressure Wound Therapy: Wound #1 Right Gluteal fold: Wound VAC settings at 125/130 mmHg continuous pressure. Use BLACK/GREEN foam to wound cavity. Use WHITE foam to fill any tunnel/s and/or undermining. Change VAC dressing 3 X WEEK. Change canister as indicated when full. Nurse may titrate settings and frequency of dressing changes as clinically indicated. - Apply Silver Collagen to line the base of the wound. White foam to any tunnels Home Health Nurse may d/c VAC for s/s of increased infection, significant wound regression, or uncontrolled drainage. [place] at [phone]. Number of foam/gauze pieces used in the dressing = Medications-please add to medication list.: Wound #1 Right Gluteal fold: Other: - IV antibiotics, difucan PO We're gonna continue with the above wound care measures for the next week. Patient is in agreement the plan. If anything changes worsens meantime  he will contact the office and let me know. Please see above for specific wound care orders. We will see patient for re-evaluation in 1 week(s) here in the clinic. If anything worsens or changes patient will contact our office for additional recommendations. Electronic Signature(s) Signed: 09/07/2018 12:02:18 AM By: Worthy Keeler [name] Entered By: Worthy Keeler on 09/06/2018 23:40:35 KENDARIOUS, GUDINO ([id number]) -------------------------------------------------------------------------------- ROS/PFSH Details Patient  Name: [name] Date of Service: 09/06/2018 8:30 AM Medical Record Number: [id number] Patient Account Number: [id number] Date of Birth/Sex: 1950/08/02 (68 y.o. M) Treating RN: Harold Barban Primary Care Provider: [name] Other Clinician: Referring Provider: [name] Treating Provider/Extender: [name], HOYT Weeks in Treatment: 40 Information Obtained From Patient Wound History Do you currently have one or more open woundso Yes How many open wounds do you currently haveo 1 Approximately how long have you had your woundso 1 month How have you been treating your wound(s) until nowo cream Has your wound(s) ever healed and then re-openedo No Have you had any lab work done in the past montho No Have you tested positive for an antibiotic resistant organism (MRSA, VRE)o No Have you tested positive for osteomyelitis (bone infection)o No Have you had any tests for circulation on your legso No Constitutional Symptoms (General Health) Complaints and Symptoms: Negative for: Fever; Chills Eyes Medical History: Positive for: Cataracts - both removed Negative for: Glaucoma; Optic Neuritis Ear/Nose/Mouth/Throat Medical History: Negative for: Chronic sinus problems/congestion; Middle ear problems Hematologic/Lymphatic Medical History: Negative for: Anemia; Hemophilia; Human Immunodeficiency Virus; Lymphedema Respiratory Complaints and Symptoms: No Complaints or Symptoms Medical History: Negative for: Aspiration; Asthma; Chronic Obstructive Pulmonary Disease (COPD); Pneumothorax; Sleep Apnea; Tuberculosis Cardiovascular Complaints and Symptoms: No Complaints or Symptoms Medical History: MATTHAN, SLEDGE ([id number]) Positive for: Hypertension - takes medication Negative for: Angina; Arrhythmia; Congestive Heart Failure; Coronary Artery Disease; Deep Vein Thrombosis; Hypotension; Myocardial Infarction; Peripheral Arterial Disease; Peripheral Venous Disease;  Phlebitis; Vasculitis Gastrointestinal Medical History: Negative for: Cirrhosis ; Colitis; Crohnos; Hepatitis A; Hepatitis B; Hepatitis C Endocrine Medical History: Negative for: Type I Diabetes; Type II Diabetes Genitourinary Medical History: Negative for: End Stage Renal Disease Immunological Medical History: Negative for: Lupus Erythematosus; Raynaudos; Scleroderma Integumentary (Skin) Medical History: Negative for: History of Burn; History of pressure wounds Musculoskeletal Medical History: Negative for: Gout; Rheumatoid Arthritis; Osteoarthritis; Osteomyelitis Neurologic Medical History: Positive for: Paraplegia - waist down Negative for: Dementia; Neuropathy; Quadriplegia; Seizure Disorder Oncologic Medical History: Negative for: Received Chemotherapy; Received Radiation Past Medical History Notes: Prostate cancer- currently treated with horomone therapy Psychiatric Complaints and Symptoms: No Complaints or Symptoms Medical History: Negative for: Anorexia/bulimia; Confinement Anxiety HBO Extended History Items Eyes: Cataracts ARISTEO, HANKERSON. ([id number]) Immunizations Pneumococcal Vaccine: Received Pneumococcal Vaccination: No Implantable Devices Family and Social History Cancer: No; Diabetes: No; Heart Disease: No; Hypertension: Yes - Father; Kidney Disease: No; Lung Disease: No; Seizures: No; Stroke: Yes - Mother; Thyroid Problems: No; Tuberculosis: No; Never smoker; Marital Status - Married; Alcohol Use: Never; Drug Use: No History; Caffeine Use: Daily; Advanced Directives: Yes (Copy provided); Patient does not want information on Advanced Directives; Living Will: Yes (Copy provided) Physician Affirmation I have reviewed and agree with the above information. Electronic Signature(s) Signed: 09/07/2018 12:02:18 AM By: Worthy Keeler [name] Signed: 09/08/2018 8:37:22 AM By: Harold Barban Entered By: Worthy Keeler on 09/06/2018 23:40:14 JSHAWN, HURTA ([id number]) -------------------------------------------------------------------------------- SuperBill Details Patient Name: [name] Date of Service: 09/06/2018 Medical Record Number: [id number] Patient Account Number: [id number] Date of  Birth/Sex: [birth year] (68 y.o. 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

## 2018-09-08 NOTE — Progress Notes (Signed)
[name] is here today for follow up of a Stage IV pressure wound.  He underwent debridement in December and has been managed by the wound clinic since his discharge. He is also followed by Dr. Delaine Lame, Infectious Disease, who is also here in clinic to see the status of his wound.  I last saw him about a month ago.  He had cutaneous candidiasis around the wound area, but otherwise, the wound appeared to be healing well.  He has since discontinued IV abx and has been on oral Bactrim. He states he's had diarrhea secondary to it, but tomorrow will be his last day. He continues weekly follow up in the wound clinic and is still at his SNF. He is pleased to report that he walked (with a walker) in physical therapy yesterday. 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  Past Medical History:  Diagnosis Date  . AVM (arteriovenous malformation) spine   . Cancer [facility])    prostate, taking Casodex (hormone chemotherapy)  . History of kidney stones    several times  . Hypertension   . 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 Prostate cancer ([place])   . Status post insertion of intrathecal baclofen pump   . Status post insertion of spinal cord stimulator    Past Surgical History:  Procedure Laterality Date  . COLONOSCOPY    . CYSTOSCOPY     several times  . CYSTOSCOPY WITH DIRECT VISION INTERNAL URETHROTOMY  05/05/2017   Procedure: CYSTOSCOPY WITH DIRECT VISION INTERNAL URETHROTOMY;  Surgeon: Royston Cowper, MD;  Location: [facility];  Service: Urology;;  . Consuela Mimes WITH LITHOLAPAXY N/A 05/05/2017   Procedure: CYSTOSCOPY WITH LITHOLAPAXY;  Surgeon: Royston Cowper, MD;  Location: [facility];  Service: Urology;  Laterality: N/A;  . EXTRACORPOREAL SHOCK WAVE LITHOTRIPSY    . EYE SURGERY Bilateral    cataract surgery  . HOLMIUM LASER APPLICATION N/A 16/04/9603   Procedure: HOLMIUM LASER APPLICATION;  Surgeon: Royston Cowper, MD;  Location: [facility];  Service:  Urology;  Laterality: N/A;  . [place]  . 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 SKIN DEBRIDEMENT Right 07/15/2018   Procedure: DEBRIDEMENT SKIN FULL THICKNESS--stage 4 decubitus ulcer;  Surgeon: Fredirick Maudlin, MD;  Location: [facility];  Service: General;  Laterality: Right;   No family history on file. Social History   Tobacco Use  . Smoking status: Never Smoker  . Smokeless tobacco: Former Systems developer    Types: Snuff  Substance Use Topics  . Alcohol use: No  . Drug use: No   Current Meds  Medication Sig  . acetaminophen (TYLENOL) 500 MG tablet Take 1,000 mg by mouth 2 (two) times daily.  . baclofen (LIORESAL) 10 MG tablet Take 10 mg by mouth daily as needed for muscle spasms.   . bicalutamide (CASODEX) 50 MG tablet Take 50 mg by mouth daily.  . collagenase (SANTYL) ointment Apply topically daily.  . Ferrous Sulfate (IRON) 325 (65 Fe) MG TABS Take 325 mg by mouth daily.  Marland Kitchen gabapentin (NEURONTIN) 800 MG tablet Take 1 tablet (800 mg total) by mouth 3 (three) times daily.  Marland Kitchen losartan (COZAAR) 100 MG tablet Take 100 mg by mouth daily.  . methenamine (HIPREX) 1 g tablet Take 1 g by mouth 3 (three) times daily.   . mirabegron ER (MYRBETRIQ) 50 MG TB24 tablet Take 50 mg by mouth daily.   Marland Kitchen  nutrition supplement, JUVEN, (JUVEN) PACK Take 1 packet by mouth 2 (two) times daily between meals.  Marland Kitchen oxybutynin (DITROPAN-XL) 10 MG 24 hr tablet Take 10 mg by mouth every morning.   Marland Kitchen PAIN MANAGEMENT INTRATHECAL, IT, PUMP 1 each by Intrathecal route continuous. Intrathecal (IT) medication:  Baclofen, Dilaudid  . polyethylene glycol powder (GLYCOLAX/MIRALAX) powder Take 17 g by mouth daily.   Marland Kitchen Propylene Glycol (SYSTANE BALANCE) 0.6 % SOLN Place 1 drop into both eyes daily as needed (dry eyes).  Marland Kitchen sulfamethoxazole-trimethoprim (BACTRIM DS,SEPTRA DS) 800-160 MG tablet Take 1 tablet by mouth 2 (two) times daily.  . vitamin C (ASCORBIC ACID) 500 MG tablet Take 1,000 mg  by mouth 3 (three) times daily.    Allergies  Allergen Reactions  . Clindamycin Hives  . Doxycycline Rash  . Penicillins Rash  . Sulfamethoxazole Rash   Vitals:   09/08/18 1319  BP: 134/76  Pulse: 87  Resp: 20  Temp: (!) 97.3 F (36.3 C)  SpO2: 95%   Focused exam: The wound has excellent granulation tissue with minimal fibrinous exudate.  No odor or drainage. Candida resolved.  Tunnel now only tracks about 2.75 cm (was initially 7).      68 y/o paraplegic male with a stage IV pressure wound, responding well to treatment.  Continue current therapy.  RTC in 1 month for re-check.

## 2018-09-13 ENCOUNTER — Encounter: Payer: Medicare Other | Admitting: Physician Assistant

## 2018-09-13 DIAGNOSIS — E11622 Type 2 diabetes mellitus with other skin ulcer: Secondary | ICD-10-CM | POA: Diagnosis not present

## 2018-09-14 NOTE — Progress Notes (Signed)
CLIFFORD, [name] ([id number]) Visit Report for 09/13/2018 Chief Complaint Document Details Patient Name: [name] Date of Service: 09/13/2018 8:30 AM Medical Record Number: [id number] Patient Account Number: [id number] Date of Birth/Sex: 03/18/1951 (68 y.o. M) M) Treating RN: Harold Barban Primary Care Provider: [name] Other Clinician: Referring Provider: [name] Treating Provider/Extender: [name], Cynara Tatham Weeks in Treatment: 30 Information Obtained from: Patient Chief Complaint Right gluteal fold ulcer Electronic Signature(s) Signed: 09/14/2018 8:52:26 AM By: Worthy Keeler [name] Entered By: Worthy Keeler on 09/13/2018 08:40:22 [name], [name] ([id number]) -------------------------------------------------------------------------------- Debridement Details Patient Name: [name] Date of Service: 09/13/2018 8:30 AM Medical Record Number: [id number] Patient Account Number: [id number] Date of Birth/Sex: May 12, 1951 (68 y.o. M) Treating RN: Harold Barban Primary Care Provider: [name] Other Clinician: Referring Provider: [name] Treating Provider/Extender: [name], Maryjo Ragon Weeks in Treatment: 41 Debridement Performed for Wound #1 Right Gluteal fold Assessment: Performed By: Physician [name], Taro Hidrogo E., [name] Debridement Type: Debridement Level of Consciousness (Pre- Awake and Alert procedure): Pre-procedure Verification/Time Yes - 09:09 Out Taken: Start Time: 09:09 Pain Control: Lidocaine Total Area Debrided (L x W): 3.98 (cm) x 1.5 (cm) = 5.97 (cm) Tissue and other material Viable, Non-Viable, Slough, Subcutaneous, Slough debrided: Level: Skin/Subcutaneous Tissue Debridement Description: Excisional Instrument: Curette Bleeding: Moderate Hemostasis Achieved: Pressure End Time: 09:12 Procedural Pain: 0 Post Procedural Pain: 0 Response to Treatment: Procedure was tolerated well Level of  Consciousness Awake and Alert (Post-procedure): Post Debridement Measurements of Total Wound Length: (cm) 3.8 Stage: Category/Stage IV Width: (cm) 1.5 Depth: (cm) 4 Volume: (cm) 17.907 Character of Wound/Ulcer Post Improved Debridement: Post Procedure Diagnosis Same as Pre-procedure Electronic Signature(s) Signed: 09/14/2018 8:52:26 AM By: Worthy Keeler [name] Signed: 09/14/2018 11:13:25 AM By: Harold Barban Entered By: Harold Barban on 09/13/2018 09:10:59 [name], [name] ([id number]) -------------------------------------------------------------------------------- HPI Details Patient Name: [name] Date of Service: 09/13/2018 8:30 AM Medical Record Number: [id number] Patient Account Number: [id number] Date of Birth/Sex: 1951/06/21 (68 y.o. 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 He states that there is no history of diabetes that he is aware of. He does have issues with his prostate and is currently receiving treatment for this by way of oral medication. With that being said I do not have a lot of details in that regard. Nonetheless the patient presents today as a result of having been referred to [place] by another provider initially home health was set to come out and take care of his wound although due to the fact that he apparently drives he's not able to receive home health. His wife is therefore trying to help take care of this wound within although they have been struggling with what exactly to do at this point.  She states that she can do some things but she is definitely not a nurse and does have some issues with looking at blood. The good news is the wound does not appear to be too deep and is fairly superficial at this point. There is no slough noted there is some nonviable skin noted around the surface of the wound  and the perimeter at this point. The central portion of the wound appears to be very good with a dermal layer noted this does not appear to be again deep enough to extend it to subcutaneous tissue at this point. 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 In the pump he has Dilaudid and baclofen. 12/07/17 on evaluation today patient presents for follow-up concerning his ongoing lower back thigh ulcer on the right. He states that he did not get the supplies ordered and therefore has not really been able to perform the dressing changes as directed exactly. His wife was able to get some Boarder Foam Dressing's from the drugstore and subsequently has been using hydrogel which did help to a degree in the wound does appear to be able smaller. There is actually more drainage this week noted than previous. 12/21/17 on evaluation today patient appears to be doing rather well in regard to his right gluteal ulcer. He has been tolerating the dressing changes without complication. There does not appear to be any evidence of infection at this point in time. Overall the wound does seem to be making some progress as far as the edges Brett concerned there's not as much in the way of overlapping of the external wound edges and he has a good epithelium to wound bed border for the most part. This however is not true right at the 12 o'clock location over the span of a little over a centimeters which actually will require debridement today to clean this away and hopefully allow it to continue to heal more  appropriately. 12/28/17 on evaluation today patient appears to be doing rather well in regard to his ulcer in the left gluteal region. He's been tolerating the dressing changes without complication. Apparently he has had some difficulty getting his dressing material. Apparently there's been some confusion with ordering we're gonna check into this. Nonetheless overall he's been showing signs of improvement which is good news. Debridement is not required today. 01/04/18 on evaluation today patient presents for follow-up concerning his right gluteal ulcer. He has been tolerating the dressing changes fairly well. On inspection today it appears he may actually have some maceration them concerned about the fact that he may be developing too much moisture in and around the wound bed which can cause delay in healing. With that being said he unfortunately really has not showed significant signs of improvement since last week's evaluation in fact this may even be just the little bit/slightly larger. Nonetheless he's been having a lot of discomfort I'm not sure this is even related to the wound as he has no pain when I'm to breeding or otherwise cleaning the wound during evaluation today. Nonetheless this is something that we did recommend he talked to his pain specialist concerning. 01/11/18 on evaluation today patient appears to be doing better in regard to his ulceration. He has been tolerating the dressing changes without complication. With that being said overall there's no evidence of infection which is good news. The only thing is he did receive the hatch affair blue classic versus the ready nonetheless I feel like this is perfectly fine and appears to have done well for him over the past week. 01/25/18 on evaluation today patient's wound actually appears to be a little bit larger than during the last evaluation. The good [name], [name]. ([id number]) news is the majority of the wound edges  actually  appear to be fairly firmly attached to the wound bed unfortunately again we're not really making progress in regard to the size. Roughly the wound is about the same size as when I first saw him although again the wound margin/edges appear to be much better. 02/01/18 on evaluation today patient actually appears to be doing very well in regard to his wound. Applying the Prisma dry does seem to be better although he does still have issues with slow progression of the wound. There was a slight improvement compared to last week's measurements today. Nonetheless I have been considering other options as far as the possibility of Theraskin or even a snap vac. In general I'm not sure that the Theraskin due to location of the wound would be a very good idea. Nonetheless I do think that a snap vac could be a possibility for the patient and in fact I think this could even be an excellent way to manage the wound possibly seeing some improvement in a very rapid fashion here. Nonetheless this is something that we would need to get approved and I did have a lengthy conversation with the patient about this today. 02/08/18 on evaluation today patient appears to be doing a little better in regard to his ulcer. He has been tolerating the dressing changes without complication. Fortunately despite the fact that the wound is a little bit smaller it's not significantly so unfortunately. We have discussed the possibility of a snap vac we did check with insurance this is actually covered at this point. Fortunately there does not appear to be any sign of infection. Overall I'm fairly pleased with how things seem to be appearing at this point. 02/15/18 on evaluation today patient appears to be doing rather well in regard to his right gluteal ulcer. Unfortunately the snap vac did not stay in place with his sheer and friction this came loose and did not seem to maintain seal very well. He worked for about two days and it did seem  to do very well during that time according to his wife but in general this does not seem to be something that's gonna be beneficial for him long-term. I do believe we need to go back to standard dressings to see if we can find something that will be of benefit. 03/02/18- He is here in follow up evaluation; there is minimal change in the wound. He will continue with the same treatment plan, would consider changing to iodosrob/iodoflex if ulcer continues to to plateau. He will follow up next week 03/08/18 on evaluation today patient's wound actually appears to be about the same size as when I previously saw him several weeks back. Unfortunately he does have some slightly dark discoloration in the central portion of the wound which has me concerned about pressure injury. I do believe he may be sitting for too long a period of time in fact he tells me that "I probably sit for much too long". He does have some Slough noted on the surface of the wound and again as far as the size of the wound is concerned I'm really not seeing anything that seems to have improved significantly. 03/15/18 on evaluation today patient appears to be doing fairly well in regard to his ulcer. The wound measured pretty much about the same today compared to last week's evaluation when looking at his graph. With that being said the area of bruising/deep tissue injury that was noted last week I do not see at this point.  He did get a new cushion fortunately this does seem to be have been of benefit in my pinion. It does appear that he's been off of this more which is good news as well I think that is definitely showing in the overall wound measurements. With that being said I do believe that he needs to continue to offload I don't think that the fact this is doing better should be or is going to allow him to not have to offload and explain this to him as well. Overall he seems to be in agreement the plan I think he understands. The  overall appearance of the wound bed is improved compared to last week I think the Iodoflex has been beneficial in that regard. 03/29/18 on evaluation today patient actually appears to be doing rather well in regard to his wound from the overall appearance standpoint he does have some granulation although there's some Slough on the surface of the wound noted as well. With that being said he unfortunately has not improved in regard to the overall measurement of the wound in volume or in size. I did have a discussion with him very specifically about offloading today. He actually does work although he mainly is just sitting throughout the day. He tells me he offloads by "lifting himself up for 30 seconds off of his chair occasionally" purchase from [employer] which does seem to have helped. And he has a new cushion that he with that being said he's also able to stand some for a very short period of time but not significant enough I think to provide appropriate offloading. I think the biggest issue at this point with the wound and the fact is not healing as quickly as we would like is due to the fact that he is really not able to appropriately offload while at work. He states the beginning after his injury he actually had a bed at his job that he could lay on in order to offload and that does seem to have been of help back at that time. Nonetheless he had not done this in quite some time unfortunately. I think that could be helpful for him this is something I would like for him to look into. 04/05/18 on evaluation today patient actually presents for follow-up concerning his right gluteal ulcer. Again he really is not significantly improved even compared to last week. He has been tolerating the dressing changes without complication. With that being said fortunately there appears to be no evidence of infection at this time. He has been more proactive in trying to offload. [name], [name]  ([id number]) 04/12/18 on evaluation today patient actually appears to be doing a little better in regard to his wound and the right gluteal fold region. He's been tolerating the dressing changes since removing the oasis without complication. However he was having a lot of burning initially with the oasis in place. He's unsure of exactly why this was given so much discomfort but he assumes that it was the oasis itself causing the problem. Nonetheless this had to be removed after about three days in place although even those three days seem to have made a fairly good improvement in regard to the overall appearance of the wound bed. In fact is the first time that he's made any improvement from the standpoint of measurements in about six weeks. He continues to have no discomfort over the area of the wound itself which leads me to wonder why he was having the  burning with the oasis when he does not even feel the actual debridement's themselves. I am somewhat perplexed by this. 04/19/18 on evaluation today patient's wound actually appears to be showing signs of epithelialization around the edge of the wound and in general actually appears to be doing better which is good news. He did have the same burning after about three days with applying the Endoform last week in the same fashion that I would generally apply a skin substitute. This seems to indicate that it's not the oasis to cause the problem but potentially the moisture buildup that just causes things to burn or there may be some other reaction with the skin prep or Steri-Strips. Nonetheless I'm not sure that is gonna be able to tolerate any skin substitute for a long period of time. The good news is the wound actually appears to be doing better today compared to last week and does seem to finally be making some progress. 04/26/18 on evaluation today patient actually appears to be doing rather well in regard to his ulcer in the right gluteal fold.  He has been tolerating the dressing changes without complication which is good news. The Endoform does seem to be helping although he was a little bit more macerated this week. This seems to be an ongoing issue with fluid control at this point. Nonetheless I think we may be able to add something like Drawtex to help control the drainage. 05/03/18 on evaluation today patient appears to actually be doing better in regard to the overall appearance of his wound. He has been tolerating the dressing changes without complication. Fortunately there appears to be no evidence of infection at this time. I really feel like his wound has shown signs as of today of turning around last week I thought so as well and definitely he could be seen in this week's overall appearance and measurements. In general I'm very pleased with the fact that he finally seems to be making a steady but sure progress. The patient likewise is very pleased. 05/17/18 on evaluation today patient appears to be doing more poorly unfortunately in regard to his ulcer. He has been tolerating the dressing changes without complication. With that being said he tells me that in the past couple of days he and his wife have noticed that we did not seem to be doing quite as well is getting dark near the center. Subsequently upon evaluation today the wound actually does appear to be doing worse compared to previous. He has been tolerating the dressing changes otherwise and he states that he is not been sitting up anymore than he was in the past from what he tells me. Still he has continued to work he states "I'm tired of dealing with this and if I have to just go home and lay in the bed all the time that's what I'll do". Nonetheless I am concerned about the fact that this wound does appear to be deeper than what it was previous. 05/24/18 upon evaluation today patient actually presents after having been in the hospital due to what was presumed to  be sepsis secondary to the wound infection. He had an elevated white blood cell count between 14 and 15. With that being said he does seem to be doing somewhat better now. His wound still is giving him some trouble nonetheless and he is obviously concerned about the fact likely talked about that this does seem to go more deeply than previously noted. I did review his wound  culture which showed evidence of Staphylococcus aureus him and group B strep. Nonetheless he is on antibiotics, Levaquin, for this. Subsequently I did review his intake summary from the hospital as well. I also did look at the CT of the lumbar spine with contrast that was performed which showed no bone destruction to suggest lumbar disguises/osteomyelitis or sacral osteomyelitis. There was no paraspinal abscess. Nonetheless it appears this may have been more of just a soft tissue infection at this point which is good news. He still is nonetheless concerned about the wound which again I think is completely reasonable considering everything he's been through recently. 05/31/18 on evaluation today on evaluation today patient actually appears to be showing signs of his wound be a little bit deeper than what I would like to see. Fortunately he does not show any signs of significant infection although his temperature was 99 today he states he's been checking this at home and has not been elevated. Nonetheless with the undermining that I'm seeing at this point I am becoming more concerned about the wound I do think that offloading is a key factor here that is preventing the speedy recovery at this point. There does not appear to be any evidence of again over infection noted. He's been using Santyl currently. 06/07/18 the patient presents today for follow-up evaluation regarding the left ulcer in the gluteal region. He has been tolerating the Wound VAC fairly well. He is obviously very frustrated with this he states that to mean is really  getting in his way. There does not appear to be any evidence of infection at this time he does have a little bit of odor I do not necessarily associate this with infection just something that we sometimes notice with Wound VAC therapy. With that being said I can definitely catch a tone of discontentment overall in the patient's demeanor today. This when he was previously in the hospital [name], [name]. ([id number]) an CT scan was done of the lumbar region which did not reveal any signs of osteomyelitis. With that being said the pelvis in particular was not evaluated distinctly which means he could still have some osteonecrosis I. Nonetheless the Wound VAC was started on Thursday I do want to get this little bit more time before jumping to a CT scan of the pelvis although that is something that I might would recommend if were not see an improvement by that time. 06/14/18 on evaluation today patient actually appears to be doing about the same in regard to his right gluteal ulcer. Again he did have a CT scan of the lumbar spine unfortunately this did not include the pelvis. Nonetheless with the depth of the wound that I'm seeing today even despite the fact that I'm not seeing any evidence of overt cellulitis I believe there's a good chance that we may be dealing with osteomyelitis somewhere in the right Ischial region. No fevers, chills, nausea, or vomiting noted at this time. 06/21/18 on evaluation today patient actually appears to be doing about the same with regard to his wound. The tunnel at 6 o'clock really does not appear to be any deeper although it is a little bit wider. I think at this point you may want to start packing this with white phone. Unfortunately I have not got approval for the CT scan of the pelvis as of yet due to the fact that Medicare apparently has been denied it due to the diagnosis codes not being appropriate according to Medicare for the  test requested. With that being  said the patient cannot have an MRI and therefore this is the only option that we have as far as testing is concerned. The patient has had infection and was on antibiotics and been added code for cellulitis of the bottom to see if this will be appropriate for getting the test approved. Nonetheless I'm concerned about the infection have been spread deeper into the Ischial region. 06/28/18 on evaluation today patient actually appears to be doing rather well all things considered in regard to the right gluteal ulcer. He has been tolerating the dressing changes without complication. With that being said the Wound VAC he states does have to be replaced almost every day or at least reinforced unfortunately. Patient actually has his CT scan later this morning we should have the results by tomorrow. 07/05/18 on evaluation today patient presents for follow-up concerning his right Ischial ulcer. He did see the surgeon Dr. Lysle Pearl last week. They were actually very happy with him and felt like he spent a tremendous amount of time with them as far as discussing his situation was concerned. In the end Dr. Lysle Pearl did contact me as well and determine that he would not recommend any surgical intervention at this point as he felt like it would not be in the patient's best interest based on what he was seeing. He recommended a referral to infectious disease. Subsequently this is something that Dr. Ines Bloomer office is working on setting up for the patient. As far as evaluation today is concerned the patient's wound actually appears to be worse at this point. I am concerned about how things Brett progressing and specifically about infection. I do not feel like it's the deeper but the area of depth is definitely widening which does have me concerned. No fevers, chills, nausea, or vomiting noted at this time. I think that we do need initiate antibiotic therapy the patient has an allow allergy to amoxicillin/penicillin he states  that he gets a rash since childhood. Nonetheless she's never had the issues with [employer] or cephalosporins in general but he is aware of. 07/27/18 on evaluation today patient presents following admission to the hospital on 07/09/18. He was subsequently discharged on 07/20/18. On 07/15/18 the patient underwent irrigation and debridement was soft tissue biopsy and bone biopsy as well as placement of a Wound VAC in the OR by Dr. Celine Ahr. During the hospital course the patient was placed on a Wound VAC and recommended follow up with surgery in three weeks actually with Dr. Delaine Lame who is infectious disease. The patient was on vancomycin during the hospital course. 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 He also had a bone culture which revealed evidence of methicillin-resistant staph aureus. He is updated CT scan 07/09/18 reveals that he had progression of the which was performed on wound to breakdown down to the trochanter where he actually had irregularities there as well suggestive of osteomyelitis. This was a change just since 9 December when we last performed a CT scan. Obviously this one had gone downhill quite significantly and rapidly. At this point upon evaluation I feel like in general the patient's wound seems to be doing fairly well all things considered upon my evaluation today. Obviously this is larger and deeper than what I previously evaluated but at the same time he seems to be making some progress as far as the appearance of the granulation tissue is concerned. I'm happy in that regard. No fevers, chills,  nausea, or vomiting noted at this time. He is on IV vancomycin and Rocephin at the facility. He is currently in [facility]. 08/03/18 upon evaluation today patient's wound appears to be doing better in regard to the overall appearance at this point in time. Fortunately he's been tolerating the Wound VAC without complication and states that  the facility has been taking excellent care of the wound site. Overall I see some Slough noted on the surface which I am going to attempt sharp debridement today of but nonetheless other than this I feel like he's making progress. 08/09/18 on evaluation today patient's wound appears to be doing much better compared to even last week's evaluation. Do believe that the Wound VAC is been of great benefit for him. He has been tolerating the dressing changes that is the Wound CONN, TROMBETTA. ([id number]) VAC without any complication and he has excellent granulation noted currently. There is no need for sharp debridement at this point. 08/16/18 on evaluation today patient actually appears to be doing very well in regard to the wound in the right gluteal fold region. This is showing signs of progress and again appears to be very healthy which is excellent news. Fortunately there is no sign of active infection by way of odor or drainage at this point. Overall I'm very pleased with how things stand. He seems to be tolerating the Wound VAC without complication. 08/23/18 on evaluation today patient actually appears to be doing better in regard to his wound. He has been tolerating the Wound VAC without complication and in fact it has been collecting a significant amount of drainage which I think is good news especially considering how the wound appears. Fortunately there is no signs of infection at this time definitely nothing appears to be worse which is good news. He has not been started on the Bactrim and Flagyl that was recommended by Dr. Delaine Lame yet. I did actually contact her office this morning in order to check and see were things Brett that regard their gonna be calling me back. 08/30/18 on evaluation today patient actually appears to show signs of excellent improvement today compared to last evaluation. The undermining is getting much better the wound seems to be feeling quite nicely and I'm very  pleased that the granulation in general. With that being said overall I feel like the patient has made excellent progress which is great news. No fevers, chills, nausea, or vomiting noted at this time. 09/06/18 on evaluation today patient actually appears to be doing rather well in regard to his right gluteal ulcer. This is showing signs of improvement in overall I'm very pleased with how things seem to be progressing. The patient likewise is please. Overall I see no evidence of infection he is about to complete his oral antibiotic regimen which is the end of the antibiotics for him in just about three days. 09/13/18 on evaluation today patient's right Ischial ulcer appears to be showing signs of continued improvement which is excellent news. He's been tolerating the dressing changes without complication. Fortunately there's no signs of infection and the wound that seems to be doing very well. Electronic Signature(s) Signed: 09/14/2018 8:52:26 AM By: Worthy Keeler [name] Entered By: Worthy Keeler on 09/13/2018 23:03:43 [name], [name] ([id number]) -------------------------------------------------------------------------------- Physical Exam Details Patient Name: [name] Date of Service: 09/13/2018 8:30 AM Medical Record Number: [id number] Patient Account Number: [id number] Date of Birth/Sex: 1951-02-15 (67 y.o. M) Treating RN: Harold Barban Primary Care Provider: [name]  Other Clinician: Referring Provider: [name] Treating Provider/Extender: [name], Caylyn Tedeschi Weeks in Treatment: 41 Constitutional Well-nourished and well-hydrated in no acute distress. Respiratory normal breathing without difficulty. clear to auscultation bilaterally. Cardiovascular regular rate and rhythm with normal S1, S2. Psychiatric this patient is able to make decisions and demonstrates good insight into disease process. Alert and Oriented x 3. pleasant and  cooperative. Notes Patient's wound bed did require some sharp agreement clear way necrotic tissue from the surface of the wound which he tolerated today without complication post debridement wound bed appears to be with significantly better which is great. Overall very pleased in this regard. Electronic Signature(s) Signed: 09/14/2018 8:52:26 AM By: Worthy Keeler [name] Entered By: Worthy Keeler on 09/13/2018 23:04:14 [name], [name] ([id number]) -------------------------------------------------------------------------------- Physician Orders Details Patient Name: [name] Date of Service: 09/13/2018 8:30 AM Medical Record Number: [id number] Patient Account Number: [id number] Date of Birth/Sex: [birth year] (67 y.o. 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 Primary Wound Dressing Wound #1 Right Gluteal fold o Silver Alginate - in clinic until SNF restarts NPWT Secondary Dressing Wound #1 Right Gluteal fold o Boardered Foam Dressing Dressing Change Frequency Wound #1 Right Gluteal fold o Change Dressing Monday, Wednesday, Friday - and as needed Follow-up Appointments Wound #1 Right Gluteal fold o Return Appointment in 1 week. Off-Loading Wound #1 Right Gluteal  fold o Mattress - Please continue air mattress at SNF o Turn and reposition every 2 hours Negative Pressure Wound Therapy Wound #1 Right Gluteal fold Hottenstein, Jahmani J. ([id number]) o Wound VAC settings at 125/130 mmHg continuous pressure. Use BLACK/GREEN foam to wound cavity. Use WHITE foam to fill any tunnel/s and/or undermining. Change VAC dressing 3 X WEEK. Change canister as indicated when full. Nurse may titrate settings and frequency of dressing changes as clinically indicated. - Apply Silver Collagen to line the base of the wound. White foam to any tunnels o Home Health Nurse may d/c VAC for s/s of increased infection, significant wound regression, or uncontrolled drainage. [place] at [phone]. o Number of foam/gauze pieces used in the dressing = Medications-please add to medication list. Wound #1 Right Gluteal fold o Other: - IV antibiotics, difucan PO Electronic Signature(s) Signed: 09/14/2018 8:52:26 AM By: Worthy Keeler [name] Signed: 09/14/2018 11:13:25 AM By: Harold Barban Entered By: Harold Barban on 09/13/2018 09:12:53 [name], [name] ([id number]) -------------------------------------------------------------------------------- Problem List Details Patient Name: [name] Date of Service: 09/13/2018 8:30 AM Medical Record Number: [id number] Patient Account Number: [id number] Date of Birth/Sex: 1951-07-16 (67 y.o. M) Treating RN: Harold Barban Primary Care Provider: [name] Other Clinician: Referring Provider: [name] Treating Provider/Extender: [name], Kwamane Whack Weeks in Treatment: 20 Active Problems ICD-10 Evaluated Encounter Code Description Active Date Today Diagnosis L89.314 Pressure ulcer of right buttock, stage 4 11/30/2017 No Yes L03.317 Cellulitis of buttock 06/21/2018 No Yes G82.20 Paraplegia, unspecified 11/30/2017 No Yes S34.109S Unspecified injury to  unspecified level of lumbar spinal  cord, 11/30/2017 No Yes sequela I10 Essential (primary) hypertension 11/30/2017 No Yes Inactive Problems Resolved Problems Electronic Signature(s) Signed: 09/14/2018 8:52:26 AM By: Worthy Keeler [name] Entered By: Worthy Keeler on 09/13/2018 08:40:17 [name], [name] ([id number]) -------------------------------------------------------------------------------- Progress Note Details Patient Name: [name] Date of Service: 09/13/2018 8:30 AM Medical Record Number: [id number] Patient Account Number: [id number] Date of Birth/Sex: 1950/08/05 (67 y.o. 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 He states that there is no history of diabetes that he is aware of. He does have issues with his prostate and is currently receiving treatment for this by way of oral medication. With that being said I do not have a lot of details in that regard. Nonetheless the patient presents today as a result of having been referred to [place] by another provider initially home health was set to come out and take care of his wound although due to the fact that he apparently drives he's not able to receive home health. His wife is therefore trying to help take care of this wound within although they have been struggling with what exactly to do at this point. She states that she can do some things but she is definitely not a nurse and does have some issues with looking at blood. The good  news is the wound does not appear to be too deep and is fairly superficial at this point. There is no slough noted there is some nonviable skin noted around the surface of the wound and the perimeter at this point. The central portion of the wound appears to be very good with a dermal layer noted this does not appear to be again deep enough to extend it to subcutaneous tissue at this point. 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 In the pump he has Dilaudid and baclofen. 12/07/17 on evaluation today patient presents for follow-up concerning his ongoing lower back thigh ulcer on the right. He states that he did not get the supplies ordered and therefore has not really been able to perform the dressing changes as directed exactly. His wife was able to get some Boarder Foam Dressing's from the drugstore and subsequently has been using hydrogel which did help to a degree in the wound does appear to be able smaller. There is actually more drainage this week noted than previous. 12/21/17 on evaluation today patient appears to be doing rather well in regard to his right gluteal ulcer. He has been tolerating the dressing changes without complication. There does not appear to be any evidence of infection at this point in time. Overall the wound does seem to be making some progress as far as the edges Brett concerned there's not as much in the way of overlapping of the external wound edges and he has a good epithelium to wound bed border for the most part. This however is not true right at the 12  o'clock location over the span of a little over a centimeters which actually will require debridement today to clean this away and hopefully allow it to continue to heal more appropriately. 12/28/17 on evaluation today patient appears to be doing rather well in regard to his ulcer in the left gluteal region. He's  been tolerating the dressing changes without complication. Apparently he has had some difficulty getting his dressing material. Apparently there's been some confusion with ordering we're gonna check into this. Nonetheless overall he's been showing signs of improvement which is good news. Debridement is not required today. 01/04/18 on evaluation today patient presents for follow-up concerning his right gluteal ulcer. He has been tolerating the dressing changes fairly well. On inspection today it appears he may actually have some maceration them concerned about the fact that he may be developing too much moisture in and around the wound bed which can cause delay in healing. With that being said he unfortunately really has not showed significant signs of improvement since last week's evaluation in fact this may even be just the little bit/slightly larger. Nonetheless he's been having a lot of discomfort I'm not sure this is even related to the wound as he has no pain when I'm to breeding or otherwise cleaning the wound during evaluation today. Nonetheless this is something that we did recommend he talked to his pain specialist concerning. 01/11/18 on evaluation today patient appears to be doing better in regard to his ulceration. He has been tolerating the dressing Botts, Treven J. ([id number]) changes without complication. With that being said overall there's no evidence of infection which is good news. The only thing is he did receive the hatch affair blue classic versus the ready nonetheless I feel like this is perfectly fine and appears to have done well for him over the past week. 01/25/18 on evaluation today patient's wound actually appears to be a little bit larger than during the last evaluation. The good news is the majority of the wound edges actually appear to be fairly firmly attached to the wound bed unfortunately again we're not really making progress in regard to the size. Roughly the  wound is about the same size as when I first saw him although again the wound margin/edges appear to be much better. 02/01/18 on evaluation today patient actually appears to be doing very well in regard to his wound. Applying the Prisma dry does seem to be better although he does still have issues with slow progression of the wound. There was a slight improvement compared to last week's measurements today. Nonetheless I have been considering other options as far as the possibility of Theraskin or even a snap vac. In general I'm not sure that the Theraskin due to location of the wound would be a very good idea. Nonetheless I do think that a snap vac could be a possibility for the patient and in fact I think this could even be an excellent way to manage the wound possibly seeing some improvement in a very rapid fashion here. Nonetheless this is something that we would need to get approved and I did have a lengthy conversation with the patient about this today. 02/08/18 on evaluation today patient appears to be doing a little better in regard to his ulcer. He has been tolerating the dressing changes without complication. Fortunately despite the fact that the wound is a little bit smaller it's not significantly so unfortunately. We have discussed the possibility of a snap vac we did check with  insurance this is actually covered at this point. Fortunately there does not appear to be any sign of infection. Overall I'm fairly pleased with how things seem to be appearing at this point. 02/15/18 on evaluation today patient appears to be doing rather well in regard to his right gluteal ulcer. Unfortunately the snap vac did not stay in place with his sheer and friction this came loose and did not seem to maintain seal very well. He worked for about two days and it did seem to do very well during that time according to his wife but in general this does not seem to be something that's gonna be beneficial for him  long-term. I do believe we need to go back to standard dressings to see if we can find something that will be of benefit. 03/02/18- He is here in follow up evaluation; there is minimal change in the wound. He will continue with the same treatment plan, would consider changing to iodosrob/iodoflex if ulcer continues to to plateau. He will follow up next week 03/08/18 on evaluation today patient's wound actually appears to be about the same size as when I previously saw him several weeks back. Unfortunately he does have some slightly dark discoloration in the central portion of the wound which has me concerned about pressure injury. I do believe he may be sitting for too long a period of time in fact he tells me that "I probably sit for much too long". He does have some Slough noted on the surface of the wound and again as far as the size of the wound is concerned I'm really not seeing anything that seems to have improved significantly. 03/15/18 on evaluation today patient appears to be doing fairly well in regard to his ulcer. The wound measured pretty much about the same today compared to last week's evaluation when looking at his graph. With that being said the area of bruising/deep tissue injury that was noted last week I do not see at this point. He did get a new cushion fortunately this does seem to be have been of benefit in my pinion. It does appear that he's been off of this more which is good news as well I think that is definitely showing in the overall wound measurements. With that being said I do believe that he needs to continue to offload I don't think that the fact this is doing better should be or is going to allow him to not have to offload and explain this to him as well. Overall he seems to be in agreement the plan I think he understands. The overall appearance of the wound bed is improved compared to last week I think the Iodoflex has been beneficial in that regard. 03/29/18 on  evaluation today patient actually appears to be doing rather well in regard to his wound from the overall appearance standpoint he does have some granulation although there's some Slough on the surface of the wound noted as well. With that being said he unfortunately has not improved in regard to the overall measurement of the wound in volume or in size. I did have a discussion with him very specifically about offloading today. He actually does work although he mainly is just sitting throughout the day. He tells me he offloads by "lifting himself up for 30 seconds off of his chair occasionally" purchase from [employer] [facility] which does seem to have helped. And he has a new cushion that he with that being said he's also able  to stand some for a very short period of time but not significant enough I think to provide appropriate offloading. I think the biggest issue at this point with the wound and the fact is not healing as quickly as we would like is due to the fact that he is really not able to appropriately offload while at work. He states the beginning after his injury he actually had a bed at his job that he could lay on in order to offload and that does seem to have been of help back at that time. Nonetheless he had not done this in quite some time unfortunately. I think that could be helpful for him this is something I would like for him to look into. KREIG, PARSON ([id number]) 04/05/18 on evaluation today patient actually presents for follow-up concerning his right gluteal ulcer. Again he really is not significantly improved even compared to last week. He has been tolerating the dressing changes without complication. With that being said fortunately there appears to be no evidence of infection at this time. He has been more proactive in trying to offload. 04/12/18 on evaluation today patient actually appears to be doing a little better in regard to his wound and the right gluteal  fold region. He's been tolerating the dressing changes since removing the oasis without complication. However he was having a lot of burning initially with the oasis in place. He's unsure of exactly why this was given so much discomfort but he assumes that it was the oasis itself causing the problem. Nonetheless this had to be removed after about three days in place although even those three days seem to have made a fairly good improvement in regard to the overall appearance of the wound bed. In fact is the first time that he's made any improvement from the standpoint of measurements in about six weeks. He continues to have no discomfort over the area of the wound itself which leads me to wonder why he was having the burning with the oasis when he does not even feel the actual debridement's themselves. I am somewhat perplexed by this. 04/19/18 on evaluation today patient's wound actually appears to be showing signs of epithelialization around the edge of the wound and in general actually appears to be doing better which is good news. He did have the same burning after about three days with applying the Endoform last week in the same fashion that I would generally apply a skin substitute. This seems to indicate that it's not the oasis to cause the problem but potentially the moisture buildup that just causes things to burn or there may be some other reaction with the skin prep or Steri-Strips. Nonetheless I'm not sure that is gonna be able to tolerate any skin substitute for a long period of time. The good news is the wound actually appears to be doing better today compared to last week and does seem to finally be making some progress. 04/26/18 on evaluation today patient actually appears to be doing rather well in regard to his ulcer in the right gluteal fold. He has been tolerating the dressing changes without complication which is good news. The Endoform does seem to be helping although he was a  little bit more macerated this week. This seems to be an ongoing issue with fluid control at this point. Nonetheless I think we may be able to add something like Drawtex to help control the drainage. 05/03/18 on evaluation today patient appears to actually be doing  better in regard to the overall appearance of his wound. He has been tolerating the dressing changes without complication. Fortunately there appears to be no evidence of infection at this time. I really feel like his wound has shown signs as of today of turning around last week I thought so as well and definitely he could be seen in this week's overall appearance and measurements. In general I'm very pleased with the fact that he finally seems to be making a steady but sure progress. The patient likewise is very pleased. 05/17/18 on evaluation today patient appears to be doing more poorly unfortunately in regard to his ulcer. He has been tolerating the dressing changes without complication. With that being said he tells me that in the past couple of days he and his wife have noticed that we did not seem to be doing quite as well is getting dark near the center. Subsequently upon evaluation today the wound actually does appear to be doing worse compared to previous. He has been tolerating the dressing changes otherwise and he states that he is not been sitting up anymore than he was in the past from what he tells me. Still he has continued to work he states "I'm tired of dealing with this and if I have to just go home and lay in the bed all the time that's what I'll do". Nonetheless I am concerned about the fact that this wound does appear to be deeper than what it was previous. 05/24/18 upon evaluation today patient actually presents after having been in the hospital due to what was presumed to be sepsis secondary to the wound infection. He had an elevated white blood cell count between 14 and 15. With that being said he does seem to be  doing somewhat better now. His wound still is giving him some trouble nonetheless and he is obviously concerned about the fact likely talked about that this does seem to go more deeply than previously noted. I did review his wound culture which showed evidence of Staphylococcus aureus him and group B strep. Nonetheless he is on antibiotics, Levaquin, for this. Subsequently I did review his intake summary from the hospital as well. I also did look at the CT of the lumbar spine with contrast that was performed which showed no bone destruction to suggest lumbar disguises/osteomyelitis or sacral osteomyelitis. There was no paraspinal abscess. Nonetheless it appears this may have been more of just a soft tissue infection at this point which is good news. He still is nonetheless concerned about the wound which again I think is completely reasonable considering everything he's been through recently. 05/31/18 on evaluation today on evaluation today patient actually appears to be showing signs of his wound be a little bit deeper than what I would like to see. Fortunately he does not show any signs of significant infection although his temperature was 99 today he states he's been checking this at home and has not been elevated. Nonetheless with the undermining that I'm seeing at this point I am becoming more concerned about the wound I do think that offloading is a key factor here that is preventing the speedy recovery at this point. There does not appear to be any evidence of again over infection noted. He's been using Santyl currently. [name], [name] ([id number]) 06/07/18 the patient presents today for follow-up evaluation regarding the left ulcer in the gluteal region. He has been tolerating the Wound VAC fairly well. He is obviously very frustrated with this he  states that to mean is really getting in his way. There does not appear to be any evidence of infection at this time he does have a little  bit of odor I do not necessarily associate this with infection just something that we sometimes notice with Wound VAC therapy. With that being said I can definitely catch a tone of discontentment overall in the patient's demeanor today. This when he was previously in the hospital an CT scan was done of the lumbar region which did not reveal any signs of osteomyelitis. With that being said the pelvis in particular was not evaluated distinctly which means he could still have some osteonecrosis I. Nonetheless the Wound VAC was started on Thursday I do want to get this little bit more time before jumping to a CT scan of the pelvis although that is something that I might would recommend if were not see an improvement by that time. 06/14/18 on evaluation today patient actually appears to be doing about the same in regard to his right gluteal ulcer. Again he did have a CT scan of the lumbar spine unfortunately this did not include the pelvis. Nonetheless with the depth of the wound that I'm seeing today even despite the fact that I'm not seeing any evidence of overt cellulitis I believe there's a good chance that we may be dealing with osteomyelitis somewhere in the right Ischial region. No fevers, chills, nausea, or vomiting noted at this time. 06/21/18 on evaluation today patient actually appears to be doing about the same with regard to his wound. The tunnel at 6 o'clock really does not appear to be any deeper although it is a little bit wider. I think at this point you may want to start packing this with white phone. Unfortunately I have not got approval for the CT scan of the pelvis as of yet due to the fact that Medicare apparently has been denied it due to the diagnosis codes not being appropriate according to Medicare for the test requested. With that being said the patient cannot have an MRI and therefore this is the only option that we have as far as testing is concerned. The patient has had  infection and was on antibiotics and been added code for cellulitis of the bottom to see if this will be appropriate for getting the test approved. Nonetheless I'm concerned about the infection have been spread deeper into the Ischial region. 06/28/18 on evaluation today patient actually appears to be doing rather well all things considered in regard to the right gluteal ulcer. He has been tolerating the dressing changes without complication. With that being said the Wound VAC he states does have to be replaced almost every day or at least reinforced unfortunately. Patient actually has his CT scan later this morning we should have the results by tomorrow. 07/05/18 on evaluation today patient presents for follow-up concerning his right Ischial ulcer. He did see the surgeon Dr. Lysle Pearl last week. They were actually very happy with him and felt like he spent a tremendous amount of time with them as far as discussing his situation was concerned. In the end Dr. Lysle Pearl did contact me as well and determine that he would not recommend any surgical intervention at this point as he felt like it would not be in the patient's best interest based on what he was seeing. He recommended a referral to infectious disease. Subsequently this is something that Dr. Ines Bloomer office is working on setting up for the patient. As  far as evaluation today is concerned the patient's wound actually appears to be worse at this point. I am concerned about how things Brett progressing and specifically about infection. I do not feel like it's the deeper but the area of depth is definitely widening which does have me concerned. No fevers, chills, nausea, or vomiting noted at this time. I think that we do need initiate antibiotic therapy the patient has an allow allergy to amoxicillin/penicillin he states that he gets a rash since childhood. Nonetheless she's never had the issues with Catholics or cephalosporins in general but he is aware  of. 07/27/18 on evaluation today patient presents following admission to the hospital on 07/09/18. He was subsequently discharged on 07/20/18. On 07/15/18 the patient underwent irrigation and debridement was soft tissue biopsy and bone biopsy as well as placement of a Wound VAC in the OR by Dr. Celine Ahr. During the hospital course the patient was placed on a Wound VAC and recommended follow up with surgery in three weeks actually with Dr. Delaine Lame who is infectious disease. The patient was on vancomycin during the hospital course. 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 He also had a bone culture which revealed evidence of methicillin-resistant staph aureus. He is updated CT scan 07/09/18 reveals that he had progression of the which was performed on wound to breakdown down to the trochanter where he actually had irregularities there as well suggestive of osteomyelitis. This was a change just since 9 December when we last performed a CT scan. Obviously this one had gone downhill quite significantly and rapidly. At this point upon evaluation I feel like in general the patient's wound seems to be doing fairly well all things considered upon my evaluation today. Obviously this is larger and deeper than what I previously evaluated but at the same time he seems to be making some progress as far as the appearance of the granulation tissue is concerned. I'm happy in that regard. No fevers, chills, nausea, or vomiting noted at this time. He is on IV vancomycin and Rocephin at the facility. He is currently in [employer]. 08/03/18 upon evaluation today patient's wound appears to be doing better in regard to the overall appearance at this point in time. Fortunately he's been tolerating the Wound VAC without complication and states that the facility has been taking KENDALL, JUSTO. ([id number]) excellent care of the wound site. Overall I see some Slough noted on the  surface which I am going to attempt sharp debridement today of but nonetheless other than this I feel like he's making progress. 08/09/18 on evaluation today patient's wound appears to be doing much better compared to even last week's evaluation. Do believe that the Wound VAC is been of great benefit for him. He has been tolerating the dressing changes that is the Wound VAC without any complication and he has excellent granulation noted currently. There is no need for sharp debridement at this point. 08/16/18 on evaluation today patient actually appears to be doing very well in regard to the wound in the right gluteal fold region. This is showing signs of progress and again appears to be very healthy which is excellent news. Fortunately there is no sign of active infection by way of odor or drainage at this point. Overall I'm very pleased with how things stand. He seems to be tolerating the Wound VAC without complication. 08/23/18 on evaluation today patient actually appears to be doing better in regard to his wound.  He has been tolerating the Wound VAC without complication and in fact it has been collecting a significant amount of drainage which I think is good news especially considering how the wound appears. Fortunately there is no signs of infection at this time definitely nothing appears to be worse which is good news. He has not been started on the Bactrim and Flagyl that was recommended by Dr. Delaine Lame yet. I did actually contact her office this morning in order to check and see were things Brett that regard their gonna be calling me back. 08/30/18 on evaluation today patient actually appears to show signs of excellent improvement today compared to last evaluation. The undermining is getting much better the wound seems to be feeling quite nicely and I'm very pleased that the granulation in general. With that being said overall I feel like the patient has made excellent progress which is great  news. No fevers, chills, nausea, or vomiting noted at this time. 09/06/18 on evaluation today patient actually appears to be doing rather well in regard to his right gluteal ulcer. This is showing signs of improvement in overall I'm very pleased with how things seem to be progressing. The patient likewise is please. Overall I see no evidence of infection he is about to complete his oral antibiotic regimen which is the end of the antibiotics for him in just about three days. 09/13/18 on evaluation today patient's right Ischial ulcer appears to be showing signs of continued improvement which is excellent news. He's been tolerating the dressing changes without complication. Fortunately there's no signs of infection and the wound that seems to be doing very well. Patient History Information obtained from Patient. Family History Hypertension - Father, Stroke - Mother, No family history of Cancer, Diabetes, Heart Disease, Kidney Disease, Lung Disease, Seizures, Thyroid Problems, Tuberculosis. Social History Never smoker, Marital Status - Married, Alcohol Use - Never, Drug Use - No History, Caffeine Use - Daily. Medical History Eyes Patient has history of Cataracts - both removed Denies history of Glaucoma, Optic Neuritis Ear/Nose/Mouth/Throat Denies history of Chronic sinus problems/congestion, Middle ear problems Hematologic/Lymphatic Denies history of Anemia, Hemophilia, Human Immunodeficiency Virus, Lymphedema Respiratory Denies history of Aspiration, Asthma, Chronic Obstructive Pulmonary Disease (COPD), Pneumothorax, Sleep Apnea, Tuberculosis Cardiovascular Patient has history of Hypertension - takes medication Denies history of Angina, Arrhythmia, Congestive Heart Failure, Coronary Artery Disease, Deep Vein Thrombosis, Dain, Leighton J. ([id number]) Hypotension, Myocardial Infarction, Peripheral Arterial Disease, Peripheral Venous Disease, Phlebitis,  Vasculitis Gastrointestinal Denies history of Cirrhosis , Colitis, Crohn s, Hepatitis A, Hepatitis B, Hepatitis C Endocrine Denies history of Type I Diabetes, Type II Diabetes Genitourinary Denies history of End Stage Renal Disease Immunological Denies history of Lupus Erythematosus, Raynaud s, Scleroderma Integumentary (Skin) Denies history of History of Burn, History of pressure wounds Musculoskeletal Denies history of Gout, Rheumatoid Arthritis, Osteoarthritis, Osteomyelitis Neurologic Patient has history of Paraplegia - waist down Denies history of Dementia, Neuropathy, Quadriplegia, Seizure Disorder Oncologic Denies history of Received Chemotherapy, Received Radiation Psychiatric Denies history of Anorexia/bulimia, Confinement Anxiety Medical And Surgical History Notes Oncologic Prostate cancer- currently treated with horomone therapy Review of Systems (ROS) Constitutional Symptoms (General Health) Denies complaints or symptoms of Fever, Chills. Respiratory The patient has no complaints or symptoms. Cardiovascular The patient has no complaints or symptoms. Psychiatric The patient has no complaints or symptoms. Objective Constitutional Well-nourished and well-hydrated in no acute distress. Vitals Time Taken: 8:45 AM, Height: 73 in, Weight: 210 lbs, BMI: 27.7, Temperature: 99.3 F, Pulse: 105 bpm, Respiratory Rate:  16 breaths/min, Blood Pressure: 162/82 mmHg. Respiratory normal breathing without difficulty. clear to auscultation bilaterally. Cardiovascular regular rate and rhythm with normal S1, S2. Psychiatric Mcquire, Jaymari J. ([id number]) this patient is able to make decisions and demonstrates good insight into disease process. Alert and Oriented x 3. pleasant and cooperative. General Notes: Patient's wound bed did require some sharp agreement clear way necrotic tissue from the surface of the wound which he tolerated today without complication post debridement  wound bed appears to be with significantly better which is great. Overall very pleased in this regard. Integumentary (Hair, Skin) Wound #1 status is Open. Original cause of wound was Pressure Injury. The wound is located on the Right Gluteal fold. The wound measures 3.8cm length x 1.5cm width x 4cm depth; 4.477cm^2 area and 17.907cm^3 volume. There is Fat Layer (Subcutaneous Tissue) Exposed exposed. There is no undermining noted, however, there is tunneling at 1:00 with a maximum distance of 4cm. There is a medium amount of serosanguineous drainage noted. The wound margin is epibole. There is medium (34-66%) red, pink, hyper - granulation within the wound bed. There is a medium (34-66%) amount of necrotic tissue within the wound bed including Adherent Slough. The periwound skin appearance exhibited: Rash, Maceration. The periwound skin appearance did not exhibit: Callus, Crepitus, Excoriation, Induration, Scarring, Dry/Scaly, Atrophie Blanche, Cyanosis, Ecchymosis, Hemosiderin Staining, Mottled, Pallor, Rubor, Erythema. Periwound temperature was noted as No Abnormality. 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 There was a Excisional Skin/Subcutaneous Tissue Debridement with a total area of 5.97 sq cm performed by [name], Rafeal [name] E., [name]. With the following instrument(s): Curette to remove Viable and Non-Viable tissue/material. Material removed includes Subcutaneous Tissue and Slough and after achieving pain control using Lidocaine. No specimens were taken. A time out was conducted at 09:09, prior to the start of the procedure. A Moderate amount of bleeding was controlled with Pressure. The procedure  was tolerated well with a pain level of 0 throughout and a pain level of 0 following the procedure. Post Debridement Measurements: 3.8cm length x 1.5cm width x 4cm depth; 17.907cm^3 volume. Post debridement Stage noted as Category/Stage IV. Character of Wound/Ulcer Post Debridement is improved. Post procedure Diagnosis Wound #1: Same as Pre-Procedure Plan BYAN, POPLASKI ([id number]) Wound Cleansing: Wound #1 Right Gluteal fold: Cleanse wound with mild soap and water Anesthetic (add to Medication List): Wound #1 Right Gluteal fold: Topical Lidocaine 4% cream applied to wound bed prior to debridement (In Clinic Only). Primary Wound Dressing: Wound #1 Right Gluteal fold: Silver Alginate - in clinic until SNF restarts NPWT Secondary Dressing: Wound #1 Right Gluteal fold: Boardered Foam Dressing Dressing Change Frequency: Wound #1 Right Gluteal fold: Change Dressing Monday, Wednesday, Friday - and as needed Follow-up Appointments: Wound #1 Right Gluteal fold: Return Appointment in 1 week. Off-Loading: Wound #1 Right Gluteal fold: Mattress - Please continue air mattress at SNF Turn and reposition every 2 hours Negative Pressure Wound Therapy: Wound #1 Right Gluteal fold: Wound VAC settings at 125/130 mmHg continuous pressure. Use BLACK/GREEN foam to wound cavity. Use WHITE foam to fill any tunnel/s and/or undermining. Change VAC dressing 3 X WEEK. Change canister as indicated when full. Nurse may titrate settings and frequency of dressing changes as clinically indicated. - Apply Silver Collagen  to line the base of the wound. White foam to any tunnels Home Health Nurse may d/c VAC for s/s of increased infection, significant wound regression, or uncontrolled drainage. [place] at [phone]. Number of foam/gauze pieces used in the dressing = Medications-please add to medication list.: Wound #1 Right Gluteal fold: Other: - IV antibiotics, difucan PO We will  continue with the above wound care measures including the Wound VAC over the next week and I'll see him at that point. He still doing well that I will likely see about switching over to a two week follow-up schedule will see how things appear. Please see above for specific wound care orders. We will see patient for re-evaluation in 1 week(s) here in the clinic. If anything worsens or changes patient will contact our office for additional recommendations. Electronic Signature(s) Signed: 09/14/2018 8:52:26 AM By: Worthy Keeler [name] Entered By: Worthy Keeler on 09/13/2018 23:04:25 RENEE, BEALE ([id number]) -------------------------------------------------------------------------------- ROS/PFSH Details Patient Name: [name] Date of Service: 09/13/2018 8:30 AM Medical Record Number: [id number] Patient Account Number: [id number] Date of Birth/Sex: [birth year] (67 y.o. M) Treating RN: Harold Barban Primary Care Provider: [name] Other Clinician: Referring Provider: [name] Treating Provider/Extender: [name], Lucian Baswell Weeks in Treatment: 68 Information Obtained From Patient Wound History Do you currently have one or more open woundso Yes How many open wounds do you currently haveo 1 Approximately how long have you had your woundso 1 month How have you been treating your wound(s) until nowo cream Has your wound(s) ever healed and then re-openedo No Have you had any lab work done in the past montho No Have you tested positive for an antibiotic resistant organism (MRSA, VRE)o No Have you tested positive for osteomyelitis (bone infection)o No Have you had any tests for circulation on your legso No Constitutional Symptoms (General Health) Complaints and Symptoms: Negative for: Fever; Chills Eyes Medical History: Positive for: Cataracts - both removed Negative for: Glaucoma; Optic Neuritis Ear/Nose/Mouth/Throat Medical History: Negative for: Chronic  sinus problems/congestion; Middle ear problems Hematologic/Lymphatic Medical History: Negative for: Anemia; Hemophilia; Human Immunodeficiency Virus; Lymphedema Respiratory Complaints and Symptoms: No Complaints or Symptoms Medical History: Negative for: Aspiration; Asthma; Chronic Obstructive Pulmonary Disease (COPD); Pneumothorax; Sleep Apnea; Tuberculosis Cardiovascular Complaints and Symptoms: No Complaints or Symptoms Medical History: JAMANI, BEARCE ([id number]) Positive for: Hypertension - takes medication Negative for: Angina; Arrhythmia; Congestive Heart Failure; Coronary Artery Disease; Deep Vein Thrombosis; Hypotension; Myocardial Infarction; Peripheral Arterial Disease; Peripheral Venous Disease; Phlebitis; Vasculitis Gastrointestinal Medical History: Negative for: Cirrhosis ; Colitis; Crohnos; Hepatitis A; Hepatitis B; Hepatitis C Endocrine Medical History: Negative for: Type I Diabetes; Type II Diabetes Genitourinary Medical History: Negative for: End Stage Renal Disease Immunological Medical History: Negative for: Lupus Erythematosus; Raynaudos; Scleroderma Integumentary (Skin) Medical History: Negative for: History of Burn; History of pressure wounds Musculoskeletal Medical History: Negative for: Gout; Rheumatoid Arthritis; Osteoarthritis; Osteomyelitis Neurologic Medical History: Positive for: Paraplegia - waist down Negative for: Dementia; Neuropathy; Quadriplegia; Seizure Disorder Oncologic Medical History: Negative for: Received Chemotherapy; Received Radiation Past Medical History Notes: Prostate cancer- currently treated with horomone therapy Psychiatric Complaints and Symptoms: No Complaints or Symptoms Medical History: Negative for: Anorexia/bulimia; Confinement Anxiety HBO Extended History Items Eyes: Cataracts DESI, ROWE. ([id number]) Immunizations Pneumococcal Vaccine: Received Pneumococcal Vaccination: No Implantable  Devices No devices added Family and Social History Cancer: No; Diabetes: No; Heart Disease: No; Hypertension: Yes - Father; Kidney Disease: No; Lung Disease: No; Seizures: No; Stroke: Yes - Mother; Thyroid  Problems: No; Tuberculosis: No; Never smoker; Marital Status - Married; Alcohol Use: Never; Drug Use: No History; Caffeine Use: Daily; Advanced Directives: Yes (Copy provided); Patient does not want information on Advanced Directives; Living Will: Yes (Copy provided) Physician Affirmation I have reviewed and agree with the above information. Electronic Signature(s) Signed: 09/14/2018 8:52:26 AM By: Worthy Keeler [name] Signed: 09/14/2018 11:13:25 AM By: Harold Barban Entered By: Worthy Keeler on 09/13/2018 23:04:00 KEKAI, GETER ([id number]) -------------------------------------------------------------------------------- SuperBill Details Patient Name: [name] Date of Service: 09/13/2018 Medical Record Number: [id number] Patient Account Number: [id number] Date of Birth/Sex: Nov 08, 1950 (67 y.o. 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

## 2018-09-16 NOTE — Progress Notes (Signed)
[name], [name] ([id number]) Visit Report for 09/13/2018 Arrival Information Details Patient Name: [name], [name] Date of Service: 09/13/2018 8:30 AM Medical Record Number: [id number] Patient Account Number: [id number] Date of Birth/Sex: Jul 23, 1950 (68 y.o. M) Treating RN: Harold Barban Primary Care Neyland Pettengill: [name] Other Clinician: Referring Raaga Maeder: [name] Treating Alhassan Everingham/Extender: [name], HOYT Weeks in Treatment: 51 Visit Information History Since Last Visit Added or deleted any medications: Yes Patient Arrived: Wheel Chair Any new allergies or adverse reactions: No Arrival Time: 08:41 Had a fall or experienced change in No Accompanied By: wife activities of daily living that may affect Transfer Assistance: None risk of falls: Patient Identification Verified: Yes Signs or symptoms of abuse/neglect since last visito No Secondary Verification Process Completed: Yes Hospitalized since last visit: No Patient Requires Transmission-Based No Implantable device outside of the clinic excluding No Precautions: cellular tissue based products placed in the center Patient Has Alerts: Yes since last visit: Patient Alerts: NOT Has Dressing in Place as Prescribed: Yes Diabetic Pain Present Now: Yes Electronic Signature(s) Signed: 09/13/2018 3:28:24 PM By: Lorine Bears RCP, RRT, CHT Entered By: Lorine Bears on 09/13/2018 08:44:03 [name], [name] ([id number]) -------------------------------------------------------------------------------- Encounter Discharge Information Details Patient Name: [name] Date of Service: 09/13/2018 8:30 AM Medical Record Number: [id number] Patient Account Number: [id number] Date of Birth/Sex: [birth year] (68 y.o. M) Treating RN: Army Melia Primary Care Tesslyn Baumert: [name] Other Clinician: Referring Amberley Hamler: [name] Treating Constancia Geeting/Extender: [name],  HOYT Weeks in Treatment: 63 Encounter Discharge Information Items Post Procedure Vitals Discharge Condition: Stable Temperature (F): 99.3 Ambulatory Status: Wheelchair Pulse (bpm): 105 Discharge Destination: Home Respiratory Rate (breaths/min): 16 Transportation: Private Auto Blood Pressure (mmHg): 162/82 Accompanied By: wife Schedule Follow-up Appointment: Yes Clinical Summary of Care: Electronic Signature(s) Signed: 09/15/2018 9:17:40 AM By: Army Melia Entered By: Army Melia on 09/13/2018 09:25:50 [name] ([id number]) -------------------------------------------------------------------------------- Lower Extremity Assessment Details Patient Name: [name] Date of Service: 09/13/2018 8:30 AM Medical Record Number: [id number] Patient Account Number: [id number] Date of Birth/Sex: 15-Oct-1950 (68 y.o. M) Treating RN: Secundino Ginger Primary Care Josten Warmuth: [name] Other Clinician: Referring Calandria Mullings: [name] Treating Aleksey Newbern/Extender: [name], HOYT Weeks in Treatment: 41 Electronic Signature(s) Signed: 09/13/2018 4:13:49 PM By: Secundino Ginger Entered By: Secundino Ginger on 09/13/2018 08:47:11 [name], [name] ([id number]) -------------------------------------------------------------------------------- Multi Wound Chart Details Patient Name: [name] Date of Service: 09/13/2018 8:30 AM Medical Record Number: [id number] Patient Account Number: [id number] Date of Birth/Sex: 02-05-1951 (68 y.o. M) Treating RN: Harold Barban Primary Care Onesty Clair: [name] Other Clinician: Referring Keah Lamba: [name] Treating Marsalis Beaulieu/Extender: [name], HOYT Weeks in Treatment: 41 Vital Signs Height(in): 73 Pulse(bpm): 105 Weight(lbs): 210 Blood Pressure(mmHg): 162/82 Body Mass Index(BMI): 28 Temperature(F): 99.3 Respiratory Rate 16 (breaths/min): Photos: [N/A:N/A] Wound Location: Right Gluteal fold N/A N/A Wounding Event:  Pressure Injury N/A N/A Primary Etiology: Pressure Ulcer N/A N/A Comorbid History: Cataracts, Hypertension, N/A N/A Paraplegia Date Acquired: 11/02/2017 N/A N/A Weeks of Treatment: 41 N/A N/A Wound Status: Open N/A N/A Measurements L x W x D 3.8x1.5x3 N/A N/A (cm) Area (cm) : 4.477 N/A N/A Volume (cm) : 13.43 N/A N/A % Reduction in Area: 55.50% N/A N/A % Reduction in Volume: -1236.30% N/A N/A Position 1 (o'clock): 1 Maximum Distance 1 (cm): 4.4 Tunneling: Yes N/A N/A Classification: Category/Stage IV N/A N/A Exudate Amount: Medium N/A N/A Exudate Type: Serosanguineous N/A N/A Exudate Color: red, brown N/A N/A Wound Margin: Epibole N/A N/A Granulation Amount: Medium (34-66%) N/A N/A Granulation  Quality: Red, Pink, Hyper-granulation N/A N/A Necrotic Amount: Medium (34-66%) N/A N/A Exposed Structures: Fat Layer (Subcutaneous N/A N/A Tissue) Exposed: Yes Fascia: No Tendon: No [name], FALCK. ([id number]) Muscle: No Joint: No Bone: No Epithelialization: Small (1-33%) N/A N/A Periwound Skin Texture: Rash: Yes N/A N/A Excoriation: No Induration: No Callus: No Crepitus: No Scarring: No Periwound Skin Moisture: Maceration: Yes N/A N/A Dry/Scaly: No Periwound Skin Color: Atrophie Blanche: No N/A N/A Cyanosis: No Ecchymosis: No Erythema: No Hemosiderin Staining: No Mottled: No Pallor: No Rubor: No Temperature: No Abnormality N/A N/A Tenderness on Palpation: No N/A N/A Wound Preparation: Ulcer Cleansing: N/A N/A Rinsed/Irrigated with Saline Topical Anesthetic Applied: Other: lidocaine 4% Treatment Notes Electronic Signature(s) Signed: 09/14/2018 11:13:25 AM By: Harold Barban Entered By: Harold Barban on 09/13/2018 09:04:32 [name], [name] ([id number]) -------------------------------------------------------------------------------- [place] Details Patient Name: [name] Date of Service: 09/13/2018 8:30 AM Medical  Record Number: [id number] Patient Account Number: [id number] Date of Birth/Sex: Mar 08, 1951 (68 y.o. M) Treating RN: Harold Barban Primary Care Albertha Beattie: [name] Other Clinician: Referring Maycol Hoying: [name] Treating Hema Lanza/Extender: [name], HOYT Weeks in Treatment: 23 Active Inactive Orientation to the Wound Care Program Nursing Diagnoses: Knowledge deficit related to the wound healing center program Goals: Patient/caregiver will verbalize understanding of the [place] Program Date Initiated: 11/30/2017 Target Resolution Date: 12/21/2017 Goal Status: Active Interventions: Provide education on orientation to the wound center Notes: Pressure Nursing Diagnoses: Knowledge deficit related to management of pressures ulcers Goals: Patient/caregiver will verbalize understanding of pressure ulcer management Date Initiated: 05/17/2018 Target Resolution Date: 05/28/2018 Goal Status: Active Interventions: Assess: immobility, friction, shearing, incontinence upon admission and as needed Notes: Wound/Skin Impairment Nursing Diagnoses: Impaired tissue integrity Goals: Patient/caregiver will verbalize understanding of skin care regimen Date Initiated: 11/30/2017 Target Resolution Date: 12/21/2017 Goal Status: Active Ulcer/skin breakdown will have a volume reduction of 30% by week 4 Date Initiated: 11/30/2017 Target Resolution Date: 12/21/2017 Goal Status: Active [name], [name] ([id number]) Interventions: Assess patient/caregiver ability to obtain necessary supplies Assess patient/caregiver ability to perform ulcer/skin care regimen upon admission and as needed Assess ulceration(s) every visit Treatment Activities: Skin care regimen initiated : 11/30/2017 Notes: Electronic Signature(s) Signed: 09/14/2018 11:13:25 AM By: Harold Barban Entered By: Harold Barban on 09/13/2018 09:04:09 [name]  ([id number]) -------------------------------------------------------------------------------- Pain Assessment Details Patient Name: [name] Date of Service: 09/13/2018 8:30 AM Medical Record Number: [id number] Patient Account Number: [id number] Date of Birth/Sex: 12-30-1950 (67 y.o. M) Treating RN: Harold Barban Primary Care Dallys Nowakowski: [name] Other Clinician: Referring Mclain Freer: [name] Treating Shirelle Tootle/Extender: [name], HOYT Weeks in Treatment: 73 Active Problems Location of Pain Severity and Description of Pain Patient Has Paino Yes Site Locations Rate the pain. Current Pain Level: 4 Pain Management and Medication Current Pain Management: Electronic Signature(s) Signed: 09/13/2018 3:28:24 PM By: Lorine Bears RCP, RRT, CHT Signed: 09/14/2018 11:13:25 AM By: Harold Barban Entered By: Lorine Bears on 09/13/2018 08:44:16 [name], [name] ([id number]) -------------------------------------------------------------------------------- Patient/Caregiver Education Details Patient Name: [name] Date of Service: 09/13/2018 8:30 AM Medical Record Number: [id number] Patient Account Number: [id number] Date of Birth/Gender: 27-Feb-1951 (67 y.o. M) Treating RN: Harold Barban Primary Care Physician: [name] Other Clinician: Referring Physician: [name] Treating Physician/Extender: Sharalyn Ink in Treatment: 61 Education Assessment Education Provided To: Patient Education Topics Provided Offloading: Handouts: What is Offloadingo Methods: Demonstration, Explain/Verbal Responses: State content correctly Wound/Skin Impairment: Handouts: Caring for Your Ulcer Methods: Demonstration, Explain/Verbal Responses: State content correctly Electronic Signature(s)  Signed: 09/14/2018 11:13:25 AM By: Harold Barban Entered By: Harold Barban on 09/13/2018 09:04:57 [name]  ([id number]) -------------------------------------------------------------------------------- Wound Assessment Details Patient Name: [name] Date of Service: 09/13/2018 8:30 AM Medical Record Number: [id number] Patient Account Number: [id number] Date of Birth/Sex: 02/22/1951 (67 y.o. M) Treating RN: Harold Barban Primary Care Saesha Llerenas: [name] Other Clinician: Referring Jazzlene Huot: [name] Treating Laquentin Loudermilk/Extender: [name], HOYT Weeks in Treatment: 41 Wound Status Wound Number: 1 Primary Etiology: Pressure Ulcer Wound Location: Right Gluteal fold Wound Status: Open Wounding Event: Pressure Injury Comorbid History: Cataracts, Hypertension, Paraplegia Date Acquired: 11/02/2017 Weeks Of Treatment: 41 Clustered Wound: No Photos Wound Measurements Length: (cm) 3.8 Width: (cm) 1.5 Depth: (cm) 4 Area: (cm) 4.477 Volume: (cm) 17.907 % Reduction in Area: 55.5% % Reduction in Volume: -1681.8% Epithelialization: Small (1-33%) Tunneling: Yes Position (o'clock): 1 Maximum Distance: (cm) 4 Undermining: No Wound Description Classification: Category/Stage IV Wound Margin: Epibole Exudate Amount: Medium Exudate Type: Serosanguineous Exudate Color: red, brown Foul Odor After Cleansing: No Slough/Fibrino Yes Wound Bed Granulation Amount: Medium (34-66%) Exposed Structure Granulation Quality: Red, Pink, Hyper-granulation Fascia Exposed: No Necrotic Amount: Medium (34-66%) Fat Layer (Subcutaneous Tissue) Exposed: Yes Necrotic Quality: Adherent Slough Tendon Exposed: No Muscle Exposed: No Joint Exposed: No Bone Exposed: No [name], [name] ([id number]) Periwound Skin Texture Texture Color No Abnormalities Noted: No No Abnormalities Noted: No Callus: No Atrophie Blanche: No Crepitus: No Cyanosis: No Excoriation: No Ecchymosis: No Induration: No Erythema: No Rash: Yes Hemosiderin Staining: No Scarring: No Mottled: No Pallor:  No Moisture Rubor: No No Abnormalities Noted: No Dry / Scaly: No Temperature / Pain Maceration: Yes Temperature: No Abnormality Wound Preparation Ulcer Cleansing: Rinsed/Irrigated with Saline Topical Anesthetic Applied: Other: lidocaine 4%, Treatment Notes Wound #1 (Right Gluteal fold) Notes Scell, bordered foam dressing; SNF nurses to reapply NPWT Electronic Signature(s) Signed: 09/14/2018 11:13:25 AM By: Harold Barban Entered By: Harold Barban on 09/13/2018 09:11:12 [name], [name] ([id number]) -------------------------------------------------------------------------------- Vitals Details Patient Name: [name] Date of Service: 09/13/2018 8:30 AM Medical Record Number: [id number] Patient Account Number: [id number] Date of Birth/Sex: 11/30/1950 (67 y.o. M) Treating RN: Harold Barban Primary Care Hadasa Gasner: [name] Other Clinician: Referring Zalan Shidler: [name] Treating Janye Maynor/Extender: [name], HOYT Weeks in Treatment: 41 Vital Signs Time Taken: 08:45 Temperature (F): 99.3 Height (in): 73 Pulse (bpm): 105 Weight (lbs): 210 Respiratory Rate (breaths/min): 16 Body Mass Index (BMI): 27.7 Blood Pressure (mmHg): 162/82 Reference Range: 80 - 120 mg / dl Electronic Signature(s) Signed: 09/13/2018 3:28:24 PM By: Lorine Bears RCP, RRT, CHT Entered By: Lorine Bears on 09/13/2018 08:45:21

## 2018-09-20 ENCOUNTER — Ambulatory Visit: Payer: Medicare Other | Admitting: Family Medicine

## 2018-09-28 ENCOUNTER — Encounter: Payer: Medicare Other | Attending: Physician Assistant | Admitting: Physician Assistant

## 2018-09-28 DIAGNOSIS — Z8249 Family history of ischemic heart disease and other diseases of the circulatory system: Secondary | ICD-10-CM | POA: Diagnosis not present

## 2018-09-28 DIAGNOSIS — G8220 Paraplegia, unspecified: Secondary | ICD-10-CM | POA: Diagnosis not present

## 2018-09-28 DIAGNOSIS — Z823 Family history of stroke: Secondary | ICD-10-CM | POA: Insufficient documentation

## 2018-09-28 DIAGNOSIS — Z8546 Personal history of malignant neoplasm of prostate: Secondary | ICD-10-CM | POA: Diagnosis not present

## 2018-09-28 DIAGNOSIS — E11622 Type 2 diabetes mellitus with other skin ulcer: Secondary | ICD-10-CM | POA: Insufficient documentation

## 2018-09-28 DIAGNOSIS — L89314 Pressure ulcer of right buttock, stage 4: Secondary | ICD-10-CM | POA: Insufficient documentation

## 2018-09-28 DIAGNOSIS — I10 Essential (primary) hypertension: Secondary | ICD-10-CM | POA: Diagnosis not present

## 2018-09-28 DIAGNOSIS — L03317 Cellulitis of buttock: Secondary | ICD-10-CM | POA: Diagnosis not present

## 2018-09-30 NOTE — Progress Notes (Addendum)
[name] ([id number]) Visit Report for 09/28/2018 Chief Complaint Document Details Patient Name: [name], [name] Date of Service: 09/28/2018 8:30 AM Medical Record Number: [id number] Patient Account Number: [id number] Date of Birth/Sex: [birth year] (67 y.o. M) Treating RN: Montey Hora Primary Care Provider: [name] Other Clinician: Referring Provider: [name] Treating Provider/Extender: [name], Deonta Bomberger Weeks in Treatment: 5 Information Obtained from: Patient Chief Complaint Right gluteal fold ulcer Electronic Signature(s) Signed: 09/29/2018 1:14:32 PM By: Worthy Keeler [name] Entered By: Worthy Keeler on 09/28/2018 08:24:35 [name] ([id number]) -------------------------------------------------------------------------------- HPI Details Patient Name: [name] Date of Service: 09/28/2018 8:30 AM Medical Record Number: [id number] Patient Account Number: [id number] Date of Birth/Sex: 1950-12-11 (67 y.o. 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 He states that there is no history of diabetes that he is aware of. He does have issues with his prostate and is currently receiving treatment for this by way of oral medication. With that being said I do not have a lot of details in that regard. Nonetheless the patient presents today as a result of having been referred to [place] by another provider initially home health was set to come out and take care of his wound although due to the fact that  he apparently drives he's not able to receive home health. His wife is therefore trying to help take care of this wound within although they have been struggling with what exactly to do at this point. She states that she can do some things but she is definitely not a nurse and does have some issues with looking at blood. The good news is the wound does not appear to be too deep and is fairly superficial at this point. There is no slough noted there is some nonviable skin noted around the surface of the wound and the perimeter at this point. The central portion of the wound appears to be very good with a dermal layer noted this does not appear to be again deep enough to extend it to subcutaneous tissue at this point. 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 In the pump he has Dilaudid and baclofen. 12/07/17 on evaluation today patient presents for follow-up concerning his ongoing lower back thigh ulcer on the right. He states that he did not get the supplies ordered and therefore has not really been able to perform the dressing changes as directed exactly. His wife was able to get some Boarder Foam Dressing's from the drugstore and subsequently has been using hydrogel which did help to a degree in the wound does appear to be able smaller. There is actually more drainage this week noted than previous. 12/21/17 on evaluation today patient appears to be doing rather well in regard to his right gluteal ulcer. He has been tolerating the dressing changes without complication. There does not appear to be any evidence of infection at this point in time. Overall the wound does seem to be making some progress as far as the edges  are concerned there's not as much in the way of overlapping of the external wound edges and he has a good epithelium to wound bed border for the most part. This however is not true  right at the 12 o'clock location over the span of a little over a centimeters which actually will require debridement today to clean this away and hopefully allow it to continue to heal more appropriately. 12/28/17 on evaluation today patient appears to be doing rather well in regard to his ulcer in the left gluteal region. He's been tolerating the dressing changes without complication. Apparently he has had some difficulty getting his dressing material. Apparently there's been some confusion with ordering we're gonna check into this. Nonetheless overall he's been showing signs of improvement which is good news. Debridement is not required today. 01/04/18 on evaluation today patient presents for follow-up concerning his right gluteal ulcer. He has been tolerating the dressing changes fairly well. On inspection today it appears he may actually have some maceration them concerned about the fact that he may be developing too much moisture in and around the wound bed which can cause delay in healing. With that being said he unfortunately really has not showed significant signs of improvement since last week's evaluation in fact this may even be just the little bit/slightly larger. Nonetheless he's been having a lot of discomfort I'm not sure this is even related to the wound as he has no pain when I'm to breeding or otherwise cleaning the wound during evaluation today. Nonetheless this is something that we did recommend he talked to his pain specialist concerning. 01/11/18 on evaluation today patient appears to be doing better in regard to his ulceration. He has been tolerating the dressing changes without complication. With that being said overall there's no evidence of infection which is good news. The only thing is he did receive the hatch affair blue classic versus the ready nonetheless I feel like this is perfectly fine and appears to have done well for him over the past week. 01/25/18 on evaluation  today patient's wound actually appears to be a little bit larger than during the last evaluation. The good [name], [name]. ([id number]) news is the majority of the wound edges actually appear to be fairly firmly attached to the wound bed unfortunately again we're not really making progress in regard to the size. Roughly the wound is about the same size as when I first saw him although again the wound margin/edges appear to be much better. 02/01/18 on evaluation today patient actually appears to be doing very well in regard to his wound. Applying the Prisma dry does seem to be better although he does still have issues with slow progression of the wound. There was a slight improvement compared to last week's measurements today. Nonetheless I have been considering other options as far as the possibility of Theraskin or even a snap vac. In general I'm not sure that the Theraskin due to location of the wound would be a very good idea. Nonetheless I do think that a snap vac could be a possibility for the patient and in fact I think this could even be an excellent way to manage the wound possibly seeing some improvement in a very rapid fashion here. Nonetheless this is something that we would need to get approved and I did have a lengthy conversation with the patient about this today. 02/08/18 on evaluation today patient appears to be doing a little better in regard to his ulcer.  He has been tolerating the dressing changes without complication. Fortunately despite the fact that the wound is a little bit smaller it's not significantly so unfortunately. We have discussed the possibility of a snap vac we did check with insurance this is actually covered at this point. Fortunately there does not appear to be any sign of infection. Overall I'm fairly pleased with how things seem to be appearing at this point. 02/15/18 on evaluation today patient appears to be doing rather well in regard to his right gluteal  ulcer. Unfortunately the snap vac did not stay in place with his sheer and friction this came loose and did not seem to maintain seal very well. He worked for about two days and it did seem to do very well during that time according to his wife but in general this does not seem to be something that's gonna be beneficial for him long-term. I do believe we need to go back to standard dressings to see if we can find something that will be of benefit. 03/02/18- He is here in follow up evaluation; there is minimal change in the wound. He will continue with the same treatment plan, would consider changing to iodosrob/iodoflex if ulcer continues to to plateau. He will follow up next week 03/08/18 on evaluation today patient's wound actually appears to be about the same size as when I previously saw him several weeks back. Unfortunately he does have some slightly dark discoloration in the central portion of the wound which has me concerned about pressure injury. I do believe he may be sitting for too long a period of time in fact he tells me that "I probably sit for much too long". He does have some Slough noted on the surface of the wound and again as far as the size of the wound is concerned I'm really not seeing anything that seems to have improved significantly. 03/15/18 on evaluation today patient appears to be doing fairly well in regard to his ulcer. The wound measured pretty much about the same today compared to last week's evaluation when looking at his graph. With that being said the area of bruising/deep tissue injury that was noted last week I do not see at this point. He did get a new cushion fortunately this does seem to be have been of benefit in my pinion. It does appear that he's been off of this more which is good news as well I think that is definitely showing in the overall wound measurements. With that being said I do believe that he needs to continue to offload I don't think that the fact  this is doing better should be or is going to allow him to not have to offload and explain this to him as well. Overall he seems to be in agreement the plan I think he understands. The overall appearance of the wound bed is improved compared to last week I think the Iodoflex has been beneficial in that regard. 03/29/18 on evaluation today patient actually appears to be doing rather well in regard to his wound from the overall appearance standpoint he does have some granulation although there's some Slough on the surface of the wound noted as well. With that being said he unfortunately has not improved in regard to the overall measurement of the wound in volume or in size. I did have a discussion with him very specifically about offloading today. He actually does work although he mainly is just sitting throughout the day. He tells me  he offloads by "lifting himself up for 30 seconds off of his chair occasionally" purchase from [employer] which does seem to have helped. And he has a new cushion that he with that being said he's also able to stand some for a very short period of time but not significant enough I think to provide appropriate offloading. I think the biggest issue at this point with the wound and the fact is not healing as quickly as we would like is due to the fact that he is really not able to appropriately offload while at work. He states the beginning after his injury he actually had a bed at his job that he could lay on in order to offload and that does seem to have been of help back at that time. Nonetheless he had not done this in quite some time unfortunately. I think that could be helpful for him this is something I would like for him to look into. 04/05/18 on evaluation today patient actually presents for follow-up concerning his right gluteal ulcer. Again he really is not significantly improved even compared to last week. He has been tolerating the dressing changes without  complication. With that being said fortunately there appears to be no evidence of infection at this time. He has been more proactive in trying to offload. [name], [name] ([id number]) 04/12/18 on evaluation today patient actually appears to be doing a little better in regard to his wound and the right gluteal fold region. He's been tolerating the dressing changes since removing the oasis without complication. However he was having a lot of burning initially with the oasis in place. He's unsure of exactly why this was given so much discomfort but he assumes that it was the oasis itself causing the problem. Nonetheless this had to be removed after about three days in place although even those three days seem to have made a fairly good improvement in regard to the overall appearance of the wound bed. In fact is the first time that he's made any improvement from the standpoint of measurements in about six weeks. He continues to have no discomfort over the area of the wound itself which leads me to wonder why he was having the burning with the oasis when he does not even feel the actual debridement's themselves. I am somewhat perplexed by this. 04/19/18 on evaluation today patient's wound actually appears to be showing signs of epithelialization around the edge of the wound and in general actually appears to be doing better which is good news. He did have the same burning after about three days with applying the Endoform last week in the same fashion that I would generally apply a skin substitute. This seems to indicate that it's not the oasis to cause the problem but potentially the moisture buildup that just causes things to burn or there may be some other reaction with the skin prep or Steri-Strips. Nonetheless I'm not sure that is gonna be able to tolerate any skin substitute for a long period of time. The good news is the wound actually appears to be doing better today compared to last week and  does seem to finally be making some progress. 04/26/18 on evaluation today patient actually appears to be doing rather well in regard to his ulcer in the right gluteal fold. He has been tolerating the dressing changes without complication which is good news. The Endoform does seem to be helping although he was a little bit more macerated this week. This  seems to be an ongoing issue with fluid control at this point. Nonetheless I think we may be able to add something like Drawtex to help control the drainage. 05/03/18 on evaluation today patient appears to actually be doing better in regard to the overall appearance of his wound. He has been tolerating the dressing changes without complication. Fortunately there appears to be no evidence of infection at this time. I really feel like his wound has shown signs as of today of turning around last week I thought so as well and definitely he could be seen in this week's overall appearance and measurements. In general I'm very pleased with the fact that he finally seems to be making a steady but sure progress. The patient likewise is very pleased. 05/17/18 on evaluation today patient appears to be doing more poorly unfortunately in regard to his ulcer. He has been tolerating the dressing changes without complication. With that being said he tells me that in the past couple of days he and his wife have noticed that we did not seem to be doing quite as well is getting dark near the center. Subsequently upon evaluation today the wound actually does appear to be doing worse compared to previous. He has been tolerating the dressing changes otherwise and he states that he is not been sitting up anymore than he was in the past from what he tells me. Still he has continued to work he states "I'm tired of dealing with this and if I have to just go home and lay in the bed all the time that's what I'll do". Nonetheless I am concerned about the fact that this wound does  appear to be deeper than what it was previous. 05/24/18 upon evaluation today patient actually presents after having been in the hospital due to what was presumed to be sepsis secondary to the wound infection. He had an elevated white blood cell count between 14 and 15. With that being said he does seem to be doing somewhat better now. His wound still is giving him some trouble nonetheless and he is obviously concerned about the fact likely talked about that this does seem to go more deeply than previously noted. I did review his wound culture which showed evidence of Staphylococcus aureus him and group B strep. Nonetheless he is on antibiotics, Levaquin, for this. Subsequently I did review his intake summary from the hospital as well. I also did look at the CT of the lumbar spine with contrast that was performed which showed no bone destruction to suggest lumbar disguises/osteomyelitis or sacral osteomyelitis. There was no paraspinal abscess. Nonetheless it appears this may have been more of just a soft tissue infection at this point which is good news. He still is nonetheless concerned about the wound which again I think is completely reasonable considering everything he's been through recently. 05/31/18 on evaluation today on evaluation today patient actually appears to be showing signs of his wound be a little bit deeper than what I would like to see. Fortunately he does not show any signs of significant infection although his temperature was 99 today he states he's been checking this at home and has not been elevated. Nonetheless with the undermining that I'm seeing at this point I am becoming more concerned about the wound I do think that offloading is a key factor here that is preventing the speedy recovery at this point. There does not appear to be any evidence of again over infection noted. He's been using  Santyl currently. 06/07/18 the patient presents today for follow-up evaluation  regarding the left ulcer in the gluteal region. He has been tolerating the Wound VAC fairly well. He is obviously very frustrated with this he states that to mean is really getting in his way. There does not appear to be any evidence of infection at this time he does have a little bit of odor I do not necessarily associate this with infection just something that we sometimes notice with Wound VAC therapy. With that being said I can definitely catch a tone of discontentment overall in the patient's demeanor today. This when he was previously in the hospital [name], [name]. ([id number]) an CT scan was done of the lumbar region which did not reveal any signs of osteomyelitis. With that being said the pelvis in particular was not evaluated distinctly which means he could still have some osteonecrosis I. Nonetheless the Wound VAC was started on Thursday I do want to get this little bit more time before jumping to a CT scan of the pelvis although that is something that I might would recommend if were not see an improvement by that time. 06/14/18 on evaluation today patient actually appears to be doing about the same in regard to his right gluteal ulcer. Again he did have a CT scan of the lumbar spine unfortunately this did not include the pelvis. Nonetheless with the depth of the wound that I'm seeing today even despite the fact that I'm not seeing any evidence of overt cellulitis I believe there's a good chance that we may be dealing with osteomyelitis somewhere in the right Ischial region. No fevers, chills, nausea, or vomiting noted at this time. 06/21/18 on evaluation today patient actually appears to be doing about the same with regard to his wound. The tunnel at 6 o'clock really does not appear to be any deeper although it is a little bit wider. I think at this point you may want to start packing this with white phone. Unfortunately I have not got approval for the CT scan of the pelvis as of yet  due to the fact that Medicare apparently has been denied it due to the diagnosis codes not being appropriate according to Medicare for the test requested. With that being said the patient cannot have an MRI and therefore this is the only option that we have as far as testing is concerned. The patient has had infection and was on antibiotics and been added code for cellulitis of the bottom to see if this will be appropriate for getting the test approved. Nonetheless I'm concerned about the infection have been spread deeper into the Ischial region. 06/28/18 on evaluation today patient actually appears to be doing rather well all things considered in regard to the right gluteal ulcer. He has been tolerating the dressing changes without complication. With that being said the Wound VAC he states does have to be replaced almost every day or at least reinforced unfortunately. Patient actually has his CT scan later this morning we should have the results by tomorrow. 07/05/18 on evaluation today patient presents for follow-up concerning his right Ischial ulcer. He did see the surgeon Dr. Lysle Pearl last week. They were actually very happy with him and felt like he spent a tremendous amount of time with them as far as discussing his situation was concerned. In the end Dr. Lysle Pearl did contact me as well and determine that he would not recommend any surgical intervention at this point as he felt like  it would not be in the patient's best interest based on what he was seeing. He recommended a referral to infectious disease. Subsequently this is something that Dr. Ines Bloomer office is working on setting up for the patient. As far as evaluation today is concerned the patient's wound actually appears to be worse at this point. I am concerned about how things are progressing and specifically about infection. I do not feel like it's the deeper but the area of depth is definitely widening which does have me concerned. No  fevers, chills, nausea, or vomiting noted at this time. I think that we do need initiate antibiotic therapy the patient has an allow allergy to amoxicillin/penicillin he states that he gets a rash since childhood. Nonetheless she's never had the issues with Catholics or cephalosporins in general but he is aware of. 07/27/18 on evaluation today patient presents following admission to the hospital on 07/09/18. He was subsequently discharged on 07/20/18. On 07/15/18 the patient underwent irrigation and debridement was soft tissue biopsy and bone biopsy as well as placement of a Wound VAC in the OR by Dr. Celine Ahr. During the hospital course the patient was placed on a Wound VAC and recommended follow up with surgery in three weeks actually with Dr. Delaine Lame who is infectious disease. The patient was on vancomycin during the hospital course. 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 He also had a bone culture which revealed evidence of methicillin-resistant staph aureus. He is updated CT scan 07/09/18 reveals that he had progression of the which was performed on wound to breakdown down to the trochanter where he actually had irregularities there as well suggestive of osteomyelitis. This was a change just since 9 December when we last performed a CT scan. Obviously this one had gone downhill quite significantly and rapidly. At this point upon evaluation I feel like in general the patient's wound seems to be doing fairly well all things considered upon my evaluation today. Obviously this is larger and deeper than what I previously evaluated but at the same time he seems to be making some progress as far as the appearance of the granulation tissue is concerned. I'm happy in that regard. No fevers, chills, nausea, or vomiting noted at this time. He is on IV vancomycin and Rocephin at the facility. He is currently in [employer]. 08/03/18 upon evaluation today  patient's wound appears to be doing better in regard to the overall appearance at this point in time. Fortunately he's been tolerating the Wound VAC without complication and states that the facility has been taking excellent care of the wound site. Overall I see some Slough noted on the surface which I am going to attempt sharp debridement today of but nonetheless other than this I feel like he's making progress. 08/09/18 on evaluation today patient's wound appears to be doing much better compared to even last week's evaluation. Do believe that the Wound VAC is been of great benefit for him. He has been tolerating the dressing changes that is the Wound [name], [name]. ([id number]) VAC without any complication and he has excellent granulation noted currently. There is no need for sharp debridement at this point. 08/16/18 on evaluation today patient actually appears to be doing very well in regard to the wound in the right gluteal fold region. This is showing signs of progress and again appears to be very healthy which is excellent news. Fortunately there is no sign of active infection by way of odor  or drainage at this point. Overall I'm very pleased with how things stand. He seems to be tolerating the Wound VAC without complication. 08/23/18 on evaluation today patient actually appears to be doing better in regard to his wound. He has been tolerating the Wound VAC without complication and in fact it has been collecting a significant amount of drainage which I think is good news especially considering how the wound appears. Fortunately there is no signs of infection at this time definitely nothing appears to be worse which is good news. He has not been started on the Bactrim and Flagyl that was recommended by Dr. Delaine Lame yet. I did actually contact her office this morning in order to check and see were things are that regard their gonna be calling me back. 08/30/18 on evaluation today patient  actually appears to show signs of excellent improvement today compared to last evaluation. The undermining is getting much better the wound seems to be feeling quite nicely and I'm very pleased that the granulation in general. With that being said overall I feel like the patient has made excellent progress which is great news. No fevers, chills, nausea, or vomiting noted at this time. 09/06/18 on evaluation today patient actually appears to be doing rather well in regard to his right gluteal ulcer. This is showing signs of improvement in overall I'm very pleased with how things seem to be progressing. The patient likewise is please. Overall I see no evidence of infection he is about to complete his oral antibiotic regimen which is the end of the antibiotics for him in just about three days. 09/13/18 on evaluation today patient's right Ischial ulcer appears to be showing signs of continued improvement which is excellent news. He's been tolerating the dressing changes without complication. Fortunately there's no signs of infection and the wound that seems to be doing very well. 09/28/18 on evaluation today patient appears to be doing rather well in regard to his right Ischial ulcer. He's been tolerating the Wound VAC without complication he knows there's much less drainage than there used to be this obviously is not a bad thing in my pinion. There's no evidence of infection despite the fact is but nothing about it now for several weeks. ll Electronic Signature(s) Signed: 09/29/2018 1:14:32 PM By: Worthy Keeler [name] Entered By: Worthy Keeler on 09/28/2018 09:22:29 [name], [name] ([id number]) -------------------------------------------------------------------------------- [facility] Details Patient Name: [name] Date of Service: 09/28/2018 8:30 AM Medical Record Number: [id number] Patient Account Number: [id number] Date of Birth/Sex: 09-18-1950 (67 y.o. M) Treating  RN: Montey Hora Primary Care Provider: [name] Other Clinician: Referring Provider: [name] Treating Provider/Extender: [name], Verma Grothaus Weeks in Treatment: 46 Procedure Performed for: Wound #1 Right Gluteal fold Performed By: Physician [name], Mitch Arquette E., [name] Post Procedure Diagnosis Same as Pre-procedure Notes 1 stick of silver nitrate used Electronic Signature(s) Signed: 09/29/2018 4:23:17 PM By: Montey Hora Entered By: Montey Hora on 09/28/2018 11:16:29 [name] ([id number]) -------------------------------------------------------------------------------- Physical Exam Details Patient Name: [name] Date of Service: 09/28/2018 8:30 AM Medical Record Number: [id number] Patient Account Number: [id number] Date of Birth/Sex: Jun 09, 1951 (67 y.o. M) Treating RN: Montey Hora Primary Care Provider: [name] Other Clinician: Referring Provider: [name] Treating Provider/Extender: [name], Farris Blash Weeks in Treatment: 52 Constitutional Well-nourished and well-hydrated in no acute distress. Respiratory normal breathing without difficulty. Psychiatric this patient is able to make decisions and demonstrates good insight into disease process. Alert and Oriented x  3. pleasant and cooperative. Notes Patient's wound bed currently shows evidence of good granulation in the base of the wound there is some Slough and biofilm on the surface of the wound therefore in light of this considering I have attempted debridement in the past without as much was old I opted to attempt treatment with silver nitrate today in order to see if we could manage this little better and even out the wound bed surface more effectively. Patient tolerated this with no discomfort today I just use one silver nitrate stick. Electronic Signature(s) Signed: 09/29/2018 1:14:32 PM By: Worthy Keeler [name] Entered By: Worthy Keeler on 09/28/2018  09:25:05 [name], [name] ([id number]) -------------------------------------------------------------------------------- Physician Orders Details Patient Name: [name] Date of Service: 09/28/2018 8:30 AM Medical Record Number: [id number] Patient Account Number: [id number] Date of Birth/Sex: [birth year] (67 y.o. 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 Primary Wound Dressing Wound #1 Right Gluteal fold o Silver Alginate - in clinic until SNF restarts NPWT Secondary Dressing Wound #1 Right Gluteal fold o Boardered Foam Dressing - in clinic until SNF restarts NPWT Dressing Change Frequency Wound #1 Right Gluteal fold o Change Dressing Monday, Wednesday, Friday - and as needed Follow-up Appointments Wound #1 Right Gluteal fold o Return Appointment in 1 week. Off-Loading Wound #1 Right Gluteal fold o Mattress - Please continue air mattress at SNF o Turn and reposition every 2 hours Negative Pressure Wound Therapy Wound #1 Right Gluteal fold Tijerina, Haskel J. ([id number]) o Wound VAC settings at 125/130 mmHg continuous pressure. Use BLACK/GREEN foam to wound cavity. Use WHITE foam to fill any tunnel/s and/or undermining. Change VAC dressing 3 X WEEK. Change canister  as indicated when full. Nurse may titrate settings and frequency of dressing changes as clinically indicated. - Apply Silver Collagen to line the base of the wound. White foam to any tunnels o Home Health Nurse may d/c VAC for s/s of increased infection, significant wound regression, or uncontrolled drainage. [place] at [phone]. o Number of foam/gauze pieces used in the dressing = Electronic Signature(s) Signed: 09/29/2018 1:14:32 PM By: Worthy Keeler [name] Signed: 09/29/2018 4:23:17 PM By: Montey Hora Entered By: Montey Hora on 09/28/2018 09:18:03 RAMIR, MALERBA ([id number]) -------------------------------------------------------------------------------- Problem List Details Patient Name: [name] Date of Service: 09/28/2018 8:30 AM Medical Record Number: [id number] Patient Account Number: [id number] Date of Birth/Sex: 1951-03-18 (67 y.o. M) Treating RN: Montey Hora Primary Care Provider: [name] Other Clinician: Referring Provider: [name] Treating Provider/Extender: [name], Zaydon Kinser Weeks in Treatment: 1 Active Problems ICD-10 Evaluated Encounter Code Description Active Date Today Diagnosis L89.314 Pressure ulcer of right buttock, stage 4 11/30/2017 No Yes L03.317 Cellulitis of buttock 06/21/2018 No Yes G82.20 Paraplegia, unspecified 11/30/2017 No Yes S34.109S Unspecified injury to unspecified level of lumbar spinal cord, 11/30/2017 No Yes sequela I10 Essential (primary) hypertension 11/30/2017 No Yes Inactive Problems Resolved Problems Electronic Signature(s) Signed: 09/29/2018  1:14:32 PM By: Worthy Keeler [name] Entered By: Worthy Keeler on 09/28/2018 08:24:30 ADISON, JERGER ([id number]) -------------------------------------------------------------------------------- Progress Note Details Patient Name: [name] Date of Service: 09/28/2018 8:30 AM Medical Record Number: [id number] Patient  Account Number: [id number] Date of Birth/Sex: 1951/07/11 (67 y.o. 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 He states that there is no history of diabetes that he is aware of. He does have issues with his prostate and is currently receiving treatment for this by way of oral medication. With that being said I do not have a lot of details in that regard. Nonetheless the patient presents today as a result of having been referred to [place] by another provider initially home health was set to come out and take care of his wound although due to the fact that he apparently drives he's not able to receive home health. His wife is therefore trying to help take care of this wound within although they have been struggling with what exactly to do at this point. She states that she can do some things but she is definitely not a nurse and does have some issues with looking at blood. The good news is the wound does not appear to be too deep and is fairly superficial at this point. There is no slough noted there is some nonviable skin noted around the surface of the wound and the perimeter at this point. The central portion of the wound appears to be very good with a dermal layer noted this does not appear to be again deep enough to extend it to subcutaneous tissue at this point. Overall the patient for a paraplegic seems to be functioning fairly well he does have both a spinal cord stimulator  as well is the intrathecal pump. In the pump he has Dilaudid and baclofen. 12/07/17 on evaluation today patient presents for follow-up concerning his ongoing lower back thigh ulcer on the right. He states that he did not get the supplies ordered and therefore has not really been able to perform the dressing changes as directed exactly. His wife was able to get some Boarder Foam Dressing's from the drugstore and subsequently has been using hydrogel which did help to a degree in the wound does appear to be able smaller. There is actually more drainage this week noted than previous. 12/21/17 on evaluation today patient appears to be doing rather well in regard to his right gluteal ulcer. He has been tolerating the dressing changes without complication. There does not appear to be any evidence of infection at this point in time. Overall the wound does seem to be making some progress as far as the edges are concerned there's not as much in the way of overlapping of the external wound edges and he has a good epithelium to wound bed border for the most part. This however is not true right at the 12 o'clock location over the span of a little over a centimeters which actually will require debridement today to clean this away and hopefully allow it  to continue to heal more appropriately. 12/28/17 on evaluation today patient appears to be doing rather well in regard to his ulcer in the left gluteal region. He's been tolerating the dressing changes without complication. Apparently he has had some difficulty getting his dressing material. Apparently there's been some confusion with ordering we're gonna check into this. Nonetheless overall he's been showing signs of improvement which is good news. Debridement is not required today. 01/04/18 on evaluation today patient presents for follow-up concerning his right gluteal ulcer. He has been tolerating the dressing changes fairly well. On inspection today it appears he  may actually have some maceration them concerned about the fact that he may be developing too much moisture in and around the wound bed which can cause delay in healing. With that being said he unfortunately really has not showed significant signs of improvement since last week's evaluation in fact this may even be just the little bit/slightly larger. Nonetheless he's been having a lot of discomfort I'm not sure this is even related to the wound as he has no pain when I'm to breeding or otherwise cleaning the wound during evaluation today. Nonetheless this is something that we did recommend he talked to his pain specialist concerning. 01/11/18 on evaluation today patient appears to be doing better in regard to his ulceration. He has been tolerating the dressing Kreiser, Andrick J. ([id number]) changes without complication. With that being said overall there's no evidence of infection which is good news. The only thing is he did receive the hatch affair blue classic versus the ready nonetheless I feel like this is perfectly fine and appears to have done well for him over the past week. 01/25/18 on evaluation today patient's wound actually appears to be a little bit larger than during the last evaluation. The good news is the majority of the wound edges actually appear to be fairly firmly attached to the wound bed unfortunately again we're not really making progress in regard to the size. Roughly the wound is about the same size as when I first saw him although again the wound margin/edges appear to be much better. 02/01/18 on evaluation today patient actually appears to be doing very well in regard to his wound. Applying the Prisma dry does seem to be better although he does still have issues with slow progression of the wound. There was a slight improvement compared to last week's measurements today. Nonetheless I have been considering other options as far as the possibility of Theraskin or even a snap  vac. In general I'm not sure that the Theraskin due to location of the wound would be a very good idea. Nonetheless I do think that a snap vac could be a possibility for the patient and in fact I think this could even be an excellent way to manage the wound possibly seeing some improvement in a very rapid fashion here. Nonetheless this is something that we would need to get approved and I did have a lengthy conversation with the patient about this today. 02/08/18 on evaluation today patient appears to be doing a little better in regard to his ulcer. He has been tolerating the dressing changes without complication. Fortunately despite the fact that the wound is a little bit smaller it's not significantly so unfortunately. We have discussed the possibility of a snap vac we did check with insurance this is actually covered at this point. Fortunately there does not appear to be any sign of infection. Overall I'm fairly pleased with how  things seem to be appearing at this point. 02/15/18 on evaluation today patient appears to be doing rather well in regard to his right gluteal ulcer. Unfortunately the snap vac did not stay in place with his sheer and friction this came loose and did not seem to maintain seal very well. He worked for about two days and it did seem to do very well during that time according to his wife but in general this does not seem to be something that's gonna be beneficial for him long-term. I do believe we need to go back to standard dressings to see if we can find something that will be of benefit. 03/02/18- He is here in follow up evaluation; there is minimal change in the wound. He will continue with the same treatment plan, would consider changing to iodosrob/iodoflex if ulcer continues to to plateau. He will follow up next week 03/08/18 on evaluation today patient's wound actually appears to be about the same size as when I previously saw him several weeks back. Unfortunately he  does have some slightly dark discoloration in the central portion of the wound which has me concerned about pressure injury. I do believe he may be sitting for too long a period of time in fact he tells me that "I probably sit for much too long". He does have some Slough noted on the surface of the wound and again as far as the size of the wound is concerned I'm really not seeing anything that seems to have improved significantly. 03/15/18 on evaluation today patient appears to be doing fairly well in regard to his ulcer. The wound measured pretty much about the same today compared to last week's evaluation when looking at his graph. With that being said the area of bruising/deep tissue injury that was noted last week I do not see at this point. He did get a new cushion fortunately this does seem to be have been of benefit in my pinion. It does appear that he's been off of this more which is good news as well I think that is definitely showing in the overall wound measurements. With that being said I do believe that he needs to continue to offload I don't think that the fact this is doing better should be or is going to allow him to not have to offload and explain this to him as well. Overall he seems to be in agreement the plan I think he understands. The overall appearance of the wound bed is improved compared to last week I think the Iodoflex has been beneficial in that regard. 03/29/18 on evaluation today patient actually appears to be doing rather well in regard to his wound from the overall appearance standpoint he does have some granulation although there's some Slough on the surface of the wound noted as well. With that being said he unfortunately has not improved in regard to the overall measurement of the wound in volume or in size. I did have a discussion with him very specifically about offloading today. He actually does work although he mainly is just sitting throughout the day. He tells me  he offloads by "lifting himself up for 30 seconds off of his chair occasionally" purchase from [employer] which does seem to have helped. And he has a new cushion that he with that being said he's also able to stand some for a very short period of time but not significant enough I think to provide appropriate offloading. I think the biggest issue  at this point with the wound and the fact is not healing as quickly as we would like is due to the fact that he is really not able to appropriately offload while at work. He states the beginning after his injury he actually had a bed at his job that he could lay on in order to offload and that does seem to have been of help back at that time. Nonetheless he had not done this in quite some time unfortunately. I think that could be helpful for him this is something I would like for him to look into. SIAOSI, ALTER ([id number]) 04/05/18 on evaluation today patient actually presents for follow-up concerning his right gluteal ulcer. Again he really is not significantly improved even compared to last week. He has been tolerating the dressing changes without complication. With that being said fortunately there appears to be no evidence of infection at this time. He has been more proactive in trying to offload. 04/12/18 on evaluation today patient actually appears to be doing a little better in regard to his wound and the right gluteal fold region. He's been tolerating the dressing changes since removing the oasis without complication. However he was having a lot of burning initially with the oasis in place. He's unsure of exactly why this was given so much discomfort but he assumes that it was the oasis itself causing the problem. Nonetheless this had to be removed after about three days in place although even those three days seem to have made a fairly good improvement in regard to the overall appearance of the wound bed. In fact is the first time that  he's made any improvement from the standpoint of measurements in about six weeks. He continues to have no discomfort over the area of the wound itself which leads me to wonder why he was having the burning with the oasis when he does not even feel the actual debridement's themselves. I am somewhat perplexed by this. 04/19/18 on evaluation today patient's wound actually appears to be showing signs of epithelialization around the edge of the wound and in general actually appears to be doing better which is good news. He did have the same burning after about three days with applying the Endoform last week in the same fashion that I would generally apply a skin substitute. This seems to indicate that it's not the oasis to cause the problem but potentially the moisture buildup that just causes things to burn or there may be some other reaction with the skin prep or Steri-Strips. Nonetheless I'm not sure that is gonna be able to tolerate any skin substitute for a long period of time. The good news is the wound actually appears to be doing better today compared to last week and does seem to finally be making some progress. 04/26/18 on evaluation today patient actually appears to be doing rather well in regard to his ulcer in the right gluteal fold. He has been tolerating the dressing changes without complication which is good news. The Endoform does seem to be helping although he was a little bit more macerated this week. This seems to be an ongoing issue with fluid control at this point. Nonetheless I think we may be able to add something like Drawtex to help control the drainage. 05/03/18 on evaluation today patient appears to actually be doing better in regard to the overall appearance of his wound. He has been tolerating the dressing changes without complication. Fortunately there appears to be no evidence  of infection at this time. I really feel like his wound has shown signs as of today of turning  around last week I thought so as well and definitely he could be seen in this week's overall appearance and measurements. In general I'm very pleased with the fact that he finally seems to be making a steady but sure progress. The patient likewise is very pleased. 05/17/18 on evaluation today patient appears to be doing more poorly unfortunately in regard to his ulcer. He has been tolerating the dressing changes without complication. With that being said he tells me that in the past couple of days he and his wife have noticed that we did not seem to be doing quite as well is getting dark near the center. Subsequently upon evaluation today the wound actually does appear to be doing worse compared to previous. He has been tolerating the dressing changes otherwise and he states that he is not been sitting up anymore than he was in the past from what he tells me. Still he has continued to work he states "I'm tired of dealing with this and if I have to just go home and lay in the bed all the time that's what I'll do". Nonetheless I am concerned about the fact that this wound does appear to be deeper than what it was previous. 05/24/18 upon evaluation today patient actually presents after having been in the hospital due to what was presumed to be sepsis secondary to the wound infection. He had an elevated white blood cell count between 14 and 15. With that being said he does seem to be doing somewhat better now. His wound still is giving him some trouble nonetheless and he is obviously concerned about the fact likely talked about that this does seem to go more deeply than previously noted. I did review his wound culture which showed evidence of Staphylococcus aureus him and group B strep. Nonetheless he is on antibiotics, Levaquin, for this. Subsequently I did review his intake summary from the hospital as well. I also did look at the CT of the lumbar spine with contrast that was performed which showed no  bone destruction to suggest lumbar disguises/osteomyelitis or sacral osteomyelitis. There was no paraspinal abscess. Nonetheless it appears this may have been more of just a soft tissue infection at this point which is good news. He still is nonetheless concerned about the wound which again I think is completely reasonable considering everything he's been through recently. 05/31/18 on evaluation today on evaluation today patient actually appears to be showing signs of his wound be a little bit deeper than what I would like to see. Fortunately he does not show any signs of significant infection although his temperature was 99 today he states he's been checking this at home and has not been elevated. Nonetheless with the undermining that I'm seeing at this point I am becoming more concerned about the wound I do think that offloading is a key factor here that is preventing the speedy recovery at this point. There does not appear to be any evidence of again over infection noted. He's been using Santyl currently. [name], [name] ([id number]) 06/07/18 the patient presents today for follow-up evaluation regarding the left ulcer in the gluteal region. He has been tolerating the Wound VAC fairly well. He is obviously very frustrated with this he states that to mean is really getting in his way. There does not appear to be any evidence of infection at this time he does  have a little bit of odor I do not necessarily associate this with infection just something that we sometimes notice with Wound VAC therapy. With that being said I can definitely catch a tone of discontentment overall in the patient's demeanor today. This when he was previously in the hospital an CT scan was done of the lumbar region which did not reveal any signs of osteomyelitis. With that being said the pelvis in particular was not evaluated distinctly which means he could still have some osteonecrosis I. Nonetheless the Wound VAC was  started on Thursday I do want to get this little bit more time before jumping to a CT scan of the pelvis although that is something that I might would recommend if were not see an improvement by that time. 06/14/18 on evaluation today patient actually appears to be doing about the same in regard to his right gluteal ulcer. Again he did have a CT scan of the lumbar spine unfortunately this did not include the pelvis. Nonetheless with the depth of the wound that I'm seeing today even despite the fact that I'm not seeing any evidence of overt cellulitis I believe there's a good chance that we may be dealing with osteomyelitis somewhere in the right Ischial region. No fevers, chills, nausea, or vomiting noted at this time. 06/21/18 on evaluation today patient actually appears to be doing about the same with regard to his wound. The tunnel at 6 o'clock really does not appear to be any deeper although it is a little bit wider. I think at this point you may want to start packing this with white phone. Unfortunately I have not got approval for the CT scan of the pelvis as of yet due to the fact that Medicare apparently has been denied it due to the diagnosis codes not being appropriate according to Medicare for the test requested. With that being said the patient cannot have an MRI and therefore this is the only option that we have as far as testing is concerned. The patient has had infection and was on antibiotics and been added code for cellulitis of the bottom to see if this will be appropriate for getting the test approved. Nonetheless I'm concerned about the infection have been spread deeper into the Ischial region. 06/28/18 on evaluation today patient actually appears to be doing rather well all things considered in regard to the right gluteal ulcer. He has been tolerating the dressing changes without complication. With that being said the Wound VAC he states does have to be replaced almost every day or  at least reinforced unfortunately. Patient actually has his CT scan later this morning we should have the results by tomorrow. 07/05/18 on evaluation today patient presents for follow-up concerning his right Ischial ulcer. He did see the surgeon Dr. Lysle Pearl last week. They were actually very happy with him and felt like he spent a tremendous amount of time with them as far as discussing his situation was concerned. In the end Dr. Lysle Pearl did contact me as well and determine that he would not recommend any surgical intervention at this point as he felt like it would not be in the patient's best interest based on what he was seeing. He recommended a referral to infectious disease. Subsequently this is something that Dr. Ines Bloomer office is working on setting up for the patient. As far as evaluation today is concerned the patient's wound actually appears to be worse at this point. I am concerned about how things are progressing  and specifically about infection. I do not feel like it's the deeper but the area of depth is definitely widening which does have me concerned. No fevers, chills, nausea, or vomiting noted at this time. I think that we do need initiate antibiotic therapy the patient has an allow allergy to amoxicillin/penicillin he states that he gets a rash since childhood. Nonetheless she's never had the issues with Catholics or cephalosporins in general but he is aware of. 07/27/18 on evaluation today patient presents following admission to the hospital on 07/09/18. He was subsequently discharged on 07/20/18. On 07/15/18 the patient underwent irrigation and debridement was soft tissue biopsy and bone biopsy as well as placement of a Wound VAC in the OR by Dr. Celine Ahr. During the hospital course the patient was placed on a Wound VAC and recommended follow up with surgery in three weeks actually with Dr. Delaine Lame who is infectious disease. The patient was on vancomycin during the hospital course. He  did have a bone culture which showed evidence of chronic osteomyelitis. He also had a bone culture which revealed evidence of methicillin-resistant staph aureus. He is updated CT scan 07/09/18 reveals that he had progression of the which was performed on wound to breakdown down to the trochanter where he actually had irregularities there as well suggestive of osteomyelitis. This was a change just since 9 December when we last performed a CT scan. Obviously this one had gone downhill quite significantly and rapidly. At this point upon evaluation I feel like in general the patient's wound seems to be doing fairly well all things considered upon my evaluation today. Obviously this is larger and deeper than what I previously evaluated but at the same time he seems to be making some progress as far as the appearance of the granulation tissue is concerned. I'm happy in that regard. No fevers, chills, nausea, or vomiting noted at this time. He is on IV vancomycin and Rocephin at the facility. He is currently in [employer]. 08/03/18 upon evaluation today patient's wound appears to be doing better in regard to the overall appearance at this point in time. Fortunately he's been tolerating the Wound VAC without complication and states that the facility has been taking [name], [name]. ([id number]) excellent care of the wound site. Overall I see some Slough noted on the surface which I am going to attempt sharp debridement today of but nonetheless other than this I feel like he's making progress. 08/09/18 on evaluation today patient's wound appears to be doing much better compared to even last week's evaluation. Do believe that the Wound VAC is been of great benefit for him. He has been tolerating the dressing changes that is the Wound VAC without any complication and he has excellent granulation noted currently. There is no need for sharp debridement at this point. 08/16/18 on evaluation today  patient actually appears to be doing very well in regard to the wound in the right gluteal fold region. This is showing signs of progress and again appears to be very healthy which is excellent news. Fortunately there is no sign of active infection by way of odor or drainage at this point. Overall I'm very pleased with how things stand. He seems to be tolerating the Wound VAC without complication. 08/23/18 on evaluation today patient actually appears to be doing better in regard to his wound. He has been tolerating the Wound VAC without complication and in fact it has been collecting a significant amount of drainage which I think is  good news especially considering how the wound appears. Fortunately there is no signs of infection at this time definitely nothing appears to be worse which is good news. He has not been started on the Bactrim and Flagyl that was recommended by Dr. Delaine Lame yet. I did actually contact her office this morning in order to check and see were things are that regard their gonna be calling me back. 08/30/18 on evaluation today patient actually appears to show signs of excellent improvement today compared to last evaluation. The undermining is getting much better the wound seems to be feeling quite nicely and I'm very pleased that the granulation in general. With that being said overall I feel like the patient has made excellent progress which is great news. No fevers, chills, nausea, or vomiting noted at this time. 09/06/18 on evaluation today patient actually appears to be doing rather well in regard to his right gluteal ulcer. This is showing signs of improvement in overall I'm very pleased with how things seem to be progressing. The patient likewise is please. Overall I see no evidence of infection he is about to complete his oral antibiotic regimen which is the end of the antibiotics for him in just about three days. 09/13/18 on evaluation today patient's right Ischial  ulcer appears to be showing signs of continued improvement which is excellent news. He's been tolerating the dressing changes without complication. Fortunately there's no signs of infection and the wound that seems to be doing very well. 09/28/18 on evaluation today patient appears to be doing rather well in regard to his right Ischial ulcer. He's been tolerating the Wound VAC without complication he knows there's much less drainage than there used to be this obviously is not a bad thing in my pinion. There's no evidence of infection despite the fact is but nothing about it now for several weeks. ll Patient History Information obtained from Patient. Family History Hypertension - Father, Stroke - Mother, No family history of Cancer, Diabetes, Heart Disease, Kidney Disease, Lung Disease, Seizures, Thyroid Problems, Tuberculosis. Social History Never smoker, Marital Status - Married, Alcohol Use - Never, Drug Use - No History, Caffeine Use - Daily. Medical History Eyes Patient has history of Cataracts - both removed Denies history of Glaucoma, Optic Neuritis Ear/Nose/Mouth/Throat Denies history of Chronic sinus problems/congestion, Middle ear problems Hematologic/Lymphatic Denies history of Anemia, Hemophilia, Human Immunodeficiency Virus, Lymphedema Respiratory Denies history of Aspiration, Asthma, Chronic Obstructive Pulmonary Disease (COPD), Pneumothorax, Sleep Apnea, Heuberger, Jocsan J. ([id number]) Tuberculosis Cardiovascular Patient has history of Hypertension - takes medication Denies history of Angina, Arrhythmia, Congestive Heart Failure, Coronary Artery Disease, Deep Vein Thrombosis, Hypotension, Myocardial Infarction, Peripheral Arterial Disease, Peripheral Venous Disease, Phlebitis, Vasculitis Gastrointestinal Denies history of Cirrhosis , Colitis, Crohn s, Hepatitis A, Hepatitis B, Hepatitis C Endocrine Denies history of Type I Diabetes, Type II  Diabetes Genitourinary Denies history of End Stage Renal Disease Immunological Denies history of Lupus Erythematosus, Raynaud s, Scleroderma Integumentary (Skin) Denies history of History of Burn, History of pressure wounds Musculoskeletal Denies history of Gout, Rheumatoid Arthritis, Osteoarthritis, Osteomyelitis Neurologic Patient has history of Paraplegia - waist down Denies history of Dementia, Neuropathy, Quadriplegia, Seizure Disorder Oncologic Denies history of Received Chemotherapy, Received Radiation Psychiatric Denies history of Anorexia/bulimia, Confinement Anxiety Medical And Surgical History Notes Oncologic Prostate cancer- currently treated with horomone therapy Review of Systems (ROS) Constitutional Symptoms (General Health) Denies complaints or symptoms of Fever, Chills. Respiratory The patient has no complaints or symptoms. Cardiovascular The patient has no complaints  or symptoms. Psychiatric The patient has no complaints or symptoms. Objective Constitutional Well-nourished and well-hydrated in no acute distress. Vitals Time Taken: 8:32 AM, Height: 73 in, Weight: 210 lbs, BMI: 27.7, Temperature: 98.4 F, Pulse: 74 bpm, Respiratory Rate: 16 breaths/min, Blood Pressure: 134/69 mmHg. Respiratory normal breathing without difficulty. Psychiatric [name], [name] ([id number]) this patient is able to make decisions and demonstrates good insight into disease process. Alert and Oriented x 3. pleasant and cooperative. General Notes: Patient's wound bed currently shows evidence of good granulation in the base of the wound there is some Slough and biofilm on the surface of the wound therefore in light of this considering I have attempted debridement in the past without as much was old I opted to attempt treatment with silver nitrate today in order to see if we could manage this little better and even out the wound bed surface more effectively. Patient tolerated this  with no discomfort today I just use one silver nitrate stick. Integumentary (Hair, Skin) Wound #1 status is Open. Original cause of wound was Pressure Injury. The wound is located on the Right Gluteal fold. The wound measures 3.5cm length x 1.5cm width x 3.7cm depth; 4.123cm^2 area and 15.256cm^3 volume. There is Fat Layer (Subcutaneous Tissue) Exposed exposed. There is no tunneling or undermining noted. There is a medium amount of serous drainage noted. The wound margin is epibole. There is medium (34-66%) red, pink, hyper - granulation within the wound bed. There is a medium (34-66%) amount of necrotic tissue within the wound bed including Adherent Slough. The periwound skin appearance exhibited: Rash, Maceration. The periwound skin appearance did not exhibit: Callus, Crepitus, Excoriation, Induration, Scarring, Dry/Scaly, Atrophie Blanche, Cyanosis, Ecchymosis, Hemosiderin Staining, Mottled, Pallor, Rubor, Erythema. Periwound temperature was noted as No Abnormality. 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 An CHEM CAUT GRANULATION TISS procedure was performed by [name], Halli Equihua E., [name]. Post procedure Diagnosis Wound #1: Same as Pre-Procedure Notes: 1 stick of silver nitrate used Plan Wound Cleansing: Wound #1 Right Gluteal fold: Cleanse wound with mild soap and water Anesthetic (add to Medication List): Wound #1 Right Gluteal fold: DUWAYNE, MATTERS. ([id number]) Topical Lidocaine 4% cream applied to wound bed prior to debridement (In Clinic Only). Primary Wound Dressing: Wound #1 Right Gluteal fold: Silver Alginate - in clinic until SNF restarts NPWT Secondary Dressing: Wound #1  Right Gluteal fold: Boardered Foam Dressing - in clinic until SNF restarts NPWT Dressing Change Frequency: Wound #1 Right Gluteal fold: Change Dressing Monday, Wednesday, Friday - and as needed Follow-up Appointments: Wound #1 Right Gluteal fold: Return Appointment in 1 week. Off-Loading: Wound #1 Right Gluteal fold: Mattress - Please continue air mattress at SNF Turn and reposition every 2 hours Negative Pressure Wound Therapy: Wound #1 Right Gluteal fold: Wound VAC settings at 125/130 mmHg continuous pressure. Use BLACK/GREEN foam to wound cavity. Use WHITE foam to fill any tunnel/s and/or undermining. Change VAC dressing 3 X WEEK. Change canister as indicated when full. Nurse may titrate settings and frequency of dressing changes as clinically indicated. - Apply Silver Collagen to line the base of the wound. White foam to any tunnels Home Health Nurse may d/c VAC for s/s of increased infection, significant wound regression, or uncontrolled drainage. Notify Wound Healing  Center at [phone]. Number of foam/gauze pieces used in the dressing = I'm in a recommend currently that we go ahead and continue with the above wound care measures for the next week. Patient is in agreement with him. Fortunately there are no signs of infection at this time. Hopefully the silver nitrate will help to decrease the buying burden on the surface of the wound thereby preventing Slough buildup and hopefully even out the surface as well so that plan. More quickly and appropriately. Patient is in agreement with this plan. I'll see him in one week for reevaluation. Please see above for specific wound care orders. We will see patient for re-evaluation in 1 week(s) here in the clinic. If anything worsens or changes patient will contact our office for additional recommendations. Electronic Signature(s) Signed: 10/13/2018 11:36:00 PM By: Worthy Keeler [name] Previous Signature: 09/29/2018 1:14:32 PM Version By:  Worthy Keeler [name] Entered By: Worthy Keeler on 10/13/2018 23:26:51 [name], [name] ([id number]) -------------------------------------------------------------------------------- ROS/PFSH Details Patient Name: [name] Date of Service: 09/28/2018 8:30 AM Medical Record Number: [id number] Patient Account Number: [id number] Date of Birth/Sex: [birth year] (67 y.o. M) Treating RN: Montey Hora Primary Care Provider: [name] Other Clinician: Referring Provider: [name] Treating Provider/Extender: [name], Daesha Insco Weeks in Treatment: 79 Information Obtained From Patient Wound History Do you currently have one or more open woundso Yes How many open wounds do you currently haveo 1 Approximately how long have you had your woundso 1 month How have you been treating your wound(s) until nowo cream Has your wound(s) ever healed and then re-openedo No Have you had any lab work done in the past montho No Have you tested positive for an antibiotic resistant organism (MRSA, VRE)o No Have you tested positive for osteomyelitis (bone infection)o No Have you had any tests for circulation on your legso No Constitutional Symptoms (General Health) Complaints and Symptoms: Negative for: Fever; Chills Eyes Medical History: Positive for: Cataracts - both removed Negative for: Glaucoma; Optic Neuritis Ear/Nose/Mouth/Throat Medical History: Negative for: Chronic sinus problems/congestion; Middle ear problems Hematologic/Lymphatic Medical History: Negative for: Anemia; Hemophilia; Human Immunodeficiency Virus; Lymphedema Respiratory Complaints and Symptoms: No Complaints or Symptoms Medical History: Negative for: Aspiration; Asthma; Chronic Obstructive Pulmonary Disease (COPD); Pneumothorax; Sleep Apnea; Tuberculosis Cardiovascular Complaints and Symptoms: No Complaints or Symptoms Medical History: COTTRELL, GENTLES ([id number]) Positive for: Hypertension -  takes medication Negative for: Angina; Arrhythmia; Congestive Heart Failure; Coronary Artery Disease; Deep Vein Thrombosis; Hypotension; Myocardial Infarction; Peripheral Arterial Disease; Peripheral Venous Disease; Phlebitis; Vasculitis Gastrointestinal Medical History: Negative for: Cirrhosis ; Colitis; Crohnos; Hepatitis A; Hepatitis B; Hepatitis C Endocrine Medical History: Negative for: Type I Diabetes; Type II Diabetes Genitourinary Medical History: Negative for: End Stage Renal Disease Immunological Medical History: Negative for: Lupus Erythematosus; Raynaudos; Scleroderma Integumentary (Skin) Medical History: Negative for: History of Burn; History of pressure wounds Musculoskeletal Medical History: Negative for: Gout; Rheumatoid Arthritis; Osteoarthritis; Osteomyelitis Neurologic Medical History: Positive for: Paraplegia - waist down Negative for: Dementia; Neuropathy; Quadriplegia; Seizure Disorder Oncologic Medical History: Negative for: Received Chemotherapy; Received Radiation Past Medical History Notes: Prostate cancer- currently treated with horomone therapy Psychiatric Complaints and Symptoms: No Complaints or Symptoms Medical History: Negative for: Anorexia/bulimia; Confinement Anxiety HBO Extended History Items Eyes: Cataracts ZAVEON, GILLEN. ([id number]) Immunizations Pneumococcal Vaccine: Received Pneumococcal Vaccination: No Implantable Devices No devices added Family and Social History Cancer: No; Diabetes: No; Heart Disease: No; Hypertension: Yes - Father; Kidney Disease: No; Lung Disease: No; Seizures: No; Stroke: Yes -  Mother; Thyroid Problems: No; Tuberculosis: No; Never smoker; Marital Status - Married; Alcohol Use: Never; Drug Use: No History; Caffeine Use: Daily; Advanced Directives: Yes (Copy provided); Patient does not want information on Advanced Directives; Living Will: Yes (Copy provided) Physician Affirmation I have reviewed and  agree with the above information. Electronic Signature(s) Signed: 09/29/2018 1:14:32 PM By: Worthy Keeler [name] Signed: 09/29/2018 4:23:17 PM By: Montey Hora Entered By: Worthy Keeler on 09/28/2018 09:22:47 MACEN, JOSLIN ([id number]) -------------------------------------------------------------------------------- SuperBill Details Patient Name: [name] Date of Service: 09/28/2018 Medical Record Number: [id number] Patient Account Number: [id number] Date of Birth/Sex: 1950-11-04 (67 y.o. 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, sequela I10 Essential (primary) hypertension Facility Procedures CPT4 Code: 80223361 Description: 22449 - CHEM CAUT GRANULATION TISS ICD-10 Diagnosis Description L89.314 Pressure ulcer of right buttock, stage 4 L03.317 Cellulitis of buttock Modifier: Quantity: 1 Physician Procedures CPT4 Code: 7530051 Description: 10211 - WC PHYS LEVEL 4 - EST PT ICD-10 Diagnosis Description L89.314 Pressure ulcer of right buttock, stage 4 L03.317 Cellulitis of buttock G82.20 Paraplegia, unspecified S34.109S Unspecified injury to unspecified level of lumbar spinal  cord Modifier: , sequela Quantity: 1 CPT4 Code: 1735670 Description: 14103 - WC PHYS CHEM CAUT GRAN TISSUE ICD-10 Diagnosis Description L89.314 Pressure ulcer of right buttock, stage 4 L03.317 Cellulitis of buttock Modifier: Quantity: 1 Electronic Signature(s) Signed: 09/29/2018 1:14:32 PM By: Worthy Keeler [name] Signed: 09/29/2018 4:23:17 PM By: Montey Hora Entered By: Montey Hora on 09/28/2018 11:16:46

## 2018-09-30 NOTE — Progress Notes (Signed)
KHAIDEN, SEGRETO ([id number]) Visit Report for 09/28/2018 Arrival Information Details Patient Name: [name], [name] Date of Service: 09/28/2018 8:30 AM Medical Record Number: [id number] Patient Account Number: [id number] Date of Birth/Sex: 09-01-1950 (68 y.o. M) Treating RN: Montey Hora Primary Care Favian Kittleson: [name] Other Clinician: Referring Latiana Tomei: [name] Treating Arie Gable/Extender: [name], HOYT Weeks in Treatment: 55 Visit Information History Since Last Visit Added or deleted any medications: No Patient Arrived: Wheel Chair Any new allergies or adverse reactions: No Arrival Time: 08:26 Had a fall or experienced change in No Accompanied By: wife activities of daily living that may affect Transfer Assistance: Manual risk of falls: Patient Identification Verified: Yes Signs or symptoms of abuse/neglect since last visito No Secondary Verification Process Completed: Yes Hospitalized since last visit: No Patient Requires Transmission-Based No Implantable device outside of the clinic excluding No Precautions: cellular tissue based products placed in the center Patient Has Alerts: Yes since last visit: Patient Alerts: NOT Has Dressing in Place as Prescribed: Yes Diabetic Pain Present Now: Yes Electronic Signature(s) Signed: 09/28/2018 3:28:00 PM By: Lorine Bears RCP, RRT, CHT Entered By: Lorine Bears on 09/28/2018 08:31:17 [name], [name] ([id number]) -------------------------------------------------------------------------------- Encounter Discharge Information Details Patient Name: [name] Date of Service: 09/28/2018 8:30 AM Medical Record Number: [id number] Patient Account Number: [id number] Date of Birth/Sex: 24-Feb-1951 (68 y.o. M) Treating RN: Montey Hora Primary Care Boss Danielsen: [name] Other Clinician: Referring Doye Montilla: [name] Treating Bela Nyborg/Extender: [name],  HOYT Weeks in Treatment: 48 Encounter Discharge Information Items Discharge Condition: Stable Ambulatory Status: Wheelchair Discharge Destination: Skilled Nursing Facility Telephoned: No Orders Sent: Yes Transportation: Private Auto Accompanied By: spouse Schedule Follow-up Appointment: Yes Clinical Summary of Care: Electronic Signature(s) Signed: 09/29/2018 4:23:17 PM By: Montey Hora Entered By: Montey Hora on 09/28/2018 09:32:35 [name] ([id number]) -------------------------------------------------------------------------------- Lower Extremity Assessment Details Patient Name: [name] Date of Service: 09/28/2018 8:30 AM Medical Record Number: [id number] Patient Account Number: [id number] Date of Birth/Sex: [birth year] (68 y.o. M) Treating RN: Army Melia Primary Care Mavis Fichera: [name] Other Clinician: Referring Vinnie Bobst: [name] Treating Eyana Stolze/Extender: [name], HOYT Weeks in Treatment: 43 Electronic Signature(s) Signed: 09/28/2018 10:08:09 AM By: Army Melia Entered By: Army Melia on 09/28/2018 08:43:45 [name], [name] ([id number]) -------------------------------------------------------------------------------- Multi Wound Chart Details Patient Name: [name] Date of Service: 09/28/2018 8:30 AM Medical Record Number: [id number] Patient Account Number: [id number] Date of Birth/Sex: 1951/01/27 (68 y.o. M) Treating RN: Montey Hora Primary Care Laretha Luepke: [name] Other Clinician: Referring Shahed Yeoman: [name] Treating Denaisha Swango/Extender: [name], HOYT Weeks in Treatment: 43 Vital Signs Height(in): 73 Pulse(bpm): 74 Weight(lbs): 210 Blood Pressure(mmHg): 134/69 Body Mass Index(BMI): 28 Temperature(F): 98.4 Respiratory Rate 16 (breaths/min): Photos: [1:No Photos] [N/A:N/A] Wound Location: [1:Right Gluteal fold] [N/A:N/A] Wounding Event: [1:Pressure Injury] [N/A:N/A] Primary  Etiology: [1:Pressure Ulcer] [N/A:N/A] Comorbid History: [1:Cataracts, Hypertension, Paraplegia] [N/A:N/A] Date Acquired: [1:11/02/2017] [N/A:N/A] Weeks of Treatment: [1:43] [N/A:N/A] Wound Status: [1:Open] [N/A:N/A] Measurements L x W x D [1:3.5x1.5x3.7] [N/A:N/A] (cm) Area (cm) : [1:4.123] [N/A:N/A] Volume (cm) : [1:15.256] [N/A:N/A] % Reduction in Area: [1:59.00%] [N/A:N/A] % Reduction in Volume: [1:-1418.00%] [N/A:N/A] Classification: [1:Category/Stage IV] [N/A:N/A] Exudate Amount: [1:Medium] [N/A:N/A] Exudate Type: [1:Serous] [N/A:N/A] Exudate Color: [1:amber] [N/A:N/A] Wound Margin: [1:Epibole] [N/A:N/A] Granulation Amount: [1:Medium (34-66%)] [N/A:N/A] Granulation Quality: [1:Red, Pink, Hyper-granulation] [N/A:N/A] Necrotic Amount: [1:Medium (34-66%)] [N/A:N/A] Exposed Structures: [1:Fat Layer (Subcutaneous Tissue) Exposed: Yes Fascia: No Tendon: No Muscle: No Joint: No Bone: No] [N/A:N/A] Epithelialization: [1:Small (1-33%)] [N/A:N/A] Periwound Skin Texture: [1:Rash: Yes Excoriation: No  Induration: No Callus: No Crepitus: No Scarring: No] [N/A:N/A] Periwound Skin Moisture: Maceration: Yes N/A N/A Dry/Scaly: No Periwound Skin Color: Atrophie Blanche: No N/A N/A Cyanosis: No Ecchymosis: No Erythema: No Hemosiderin Staining: No Mottled: No Pallor: No Rubor: No Temperature: No Abnormality N/A N/A Tenderness on Palpation: No N/A N/A Wound Preparation: Ulcer Cleansing: N/A N/A Rinsed/Irrigated with Saline Topical Anesthetic Applied: Other: lidocaine 4% Treatment Notes Electronic Signature(s) Signed: 09/29/2018 4:23:17 PM By: Montey Hora Entered By: Montey Hora on 09/28/2018 09:12:48 [name], [name] ([id number]) -------------------------------------------------------------------------------- [place] Details Patient Name: [name] Date of Service: 09/28/2018 8:30 AM Medical Record Number: [id number] Patient Account  Number: [id number] Date of Birth/Sex: 1951-03-12 (68 y.o. M) Treating RN: Montey Hora Primary Care Valin Massie: [name] Other Clinician: Referring Khyren Hing: [name] Treating Devron Cohick/Extender: [name], HOYT Weeks in Treatment: 20 Active Inactive Orientation to the Wound Care Program Nursing Diagnoses: Knowledge deficit related to the wound healing center program Goals: Patient/caregiver will verbalize understanding of the [facility] Date Initiated: 11/30/2017 Target Resolution Date: 12/21/2017 Goal Status: Active Interventions: Provide education on orientation to the wound center Notes: Pressure Nursing Diagnoses: Knowledge deficit related to management of pressures ulcers Goals: Patient/caregiver will verbalize understanding of pressure ulcer management Date Initiated: 05/17/2018 Target Resolution Date: 05/28/2018 Goal Status: Active Interventions: Assess: immobility, friction, shearing, incontinence upon admission and as needed Notes: Wound/Skin Impairment Nursing Diagnoses: Impaired tissue integrity Goals: Patient/caregiver will verbalize understanding of skin care regimen Date Initiated: 11/30/2017 Target Resolution Date: 12/21/2017 Goal Status: Active Ulcer/skin breakdown will have a volume reduction of 30% by week 4 Date Initiated: 11/30/2017 Target Resolution Date: 12/21/2017 Goal Status: Active [name], [name] ([id number]) Interventions: Assess patient/caregiver ability to obtain necessary supplies Assess patient/caregiver ability to perform ulcer/skin care regimen upon admission and as needed Assess ulceration(s) every visit Treatment Activities: Skin care regimen initiated : 11/30/2017 Notes: Electronic Signature(s) Signed: 09/29/2018 4:23:17 PM By: Montey Hora Entered By: Montey Hora on 09/28/2018 09:12:40 [name], [name]  ([id number]) -------------------------------------------------------------------------------- Pain Assessment Details Patient Name: [name] Date of Service: 09/28/2018 8:30 AM Medical Record Number: [id number] Patient Account Number: [id number] Date of Birth/Sex: 08-Jan-1951 (67 y.o. M) Treating RN: Montey Hora Primary Care Tearsa Kowalewski: [name] Other Clinician: Referring Britten Seyfried: [name] Treating Larhonda Dettloff/Extender: [name], HOYT Weeks in Treatment: 67 Active Problems Location of Pain Severity and Description of Pain Patient Has Paino Yes Site Locations Rate the pain. Current Pain Level: 5 Pain Management and Medication Current Pain Management: Electronic Signature(s) Signed: 09/28/2018 3:28:00 PM By: Paulla Fore, RRT, CHT Signed: 09/29/2018 4:23:17 PM By: Montey Hora Entered By: Lorine Bears on 09/28/2018 08:31:25 [name], [name] ([id number]) -------------------------------------------------------------------------------- Patient/Caregiver Education Details Patient Name: [name] Date of Service: 09/28/2018 8:30 AM Medical Record Number: [id number] Patient Account Number: [id number] Date of Birth/Gender: 06-06-1951 (67 y.o. M) Treating RN: Montey Hora Primary Care Physician: [name] Other Clinician: Referring Physician: [name] Treating Physician/Extender: Sharalyn Ink in Treatment: 93 Education Assessment Education Provided To: Caregiver SNF nurses Education Topics Provided Wound/Skin Impairment: Handouts: Other: signed wound care orders Methods: Explain/Verbal Electronic Signature(s) Signed: 09/29/2018 4:23:17 PM By: Montey Hora Entered By: Montey Hora on 09/28/2018 09:32:53 [name], [name] ([id number]) -------------------------------------------------------------------------------- Wound Assessment Details Patient Name: [name] Date of Service: 09/28/2018 8:30 AM Medical Record Number: [id number] Patient Account Number: [id number] Date of Birth/Sex: 09-05-1950 (67 y.o. M) Treating RN: Army Melia Primary Care Federico Maiorino: [name] Other Clinician: Referring Lawonda Pretlow: Ginette Pitman,  Vishwanath Treating Shatiqua Heroux/Extender: [name], HOYT Weeks in Treatment: 43 Wound Status Wound Number: 1 Primary Etiology: Pressure Ulcer Wound Location: Right Gluteal fold Wound Status: Open Wounding Event: Pressure Injury Comorbid History: Cataracts, Hypertension, Paraplegia Date Acquired: 11/02/2017 Weeks Of Treatment: 43 Clustered Wound: No Photos Photo Uploaded By: Army Melia on 09/28/2018 16:16:37 Wound Measurements Length: (cm) 3.5 Width: (cm) 1.5 Depth: (cm) 3.7 Area: (cm) 4.123 Volume: (cm) 15.256 % Reduction in Area: 59% % Reduction in Volume: -1418% Epithelialization: Small (1-33%) Tunneling: No Undermining: No Wound Description Classification: Category/Stage IV Foul Odo Wound Margin: Epibole Slough/F Exudate Amount: Medium Exudate Type: Serous Exudate Color: amber r After Cleansing: No ibrino Yes Wound Bed Granulation Amount: Medium (34-66%) Exposed Structure Granulation Quality: Red, Pink, Hyper-granulation Fascia Exposed: No Necrotic Amount: Medium (34-66%) Fat Layer (Subcutaneous Tissue) Exposed: Yes Necrotic Quality: Adherent Slough Tendon Exposed: No Muscle Exposed: No Joint Exposed: No Bone Exposed: No Periwound Skin Texture Langhans, Jakaree J. ([id number]) Texture Color No Abnormalities Noted: No No Abnormalities Noted: No Callus: No Atrophie Blanche: No Crepitus: No Cyanosis: No Excoriation: No Ecchymosis: No Induration: No Erythema: No Rash: Yes Hemosiderin Staining: No Scarring: No Mottled: No Pallor: No Moisture Rubor: No No Abnormalities Noted: No Dry / Scaly: No Temperature / Pain Maceration: Yes Temperature: No Abnormality Wound Preparation Ulcer Cleansing:  Rinsed/Irrigated with Saline Topical Anesthetic Applied: Other: lidocaine 4%, Treatment Notes Wound #1 (Right Gluteal fold) Notes Scell, bordered foam dressing; SNF nurses to reapply NPWT Electronic Signature(s) Signed: 09/28/2018 10:08:09 AM By: Army Melia Entered By: Army Melia on 09/28/2018 08:43:32 [name], [name] ([id number]) -------------------------------------------------------------------------------- Vitals Details Patient Name: [name] Date of Service: 09/28/2018 8:30 AM Medical Record Number: [id number] Patient Account Number: [id number] Date of Birth/Sex: 02-09-1951 (67 y.o. M) Treating RN: Montey Hora Primary Care Leelyn Jasinski: [name] Other Clinician: Referring Emmanuella Mirante: [name] Treating Fitz Matsuo/Extender: [name], HOYT Weeks in Treatment: 27 Vital Signs Time Taken: 08:32 Temperature (F): 98.4 Height (in): 73 Pulse (bpm): 74 Weight (lbs): 210 Respiratory Rate (breaths/min): 16 Body Mass Index (BMI): 27.7 Blood Pressure (mmHg): 134/69 Reference Range: 80 - 120 mg / dl Electronic Signature(s) Signed: 09/28/2018 3:28:00 PM By: Lorine Bears RCP, RRT, CHT Entered By: Lorine Bears on 09/28/2018 08:32:15

## 2018-10-04 ENCOUNTER — Encounter: Payer: Medicare Other | Admitting: Physician Assistant

## 2018-10-04 ENCOUNTER — Other Ambulatory Visit: Payer: Self-pay

## 2018-10-04 DIAGNOSIS — E11622 Type 2 diabetes mellitus with other skin ulcer: Secondary | ICD-10-CM | POA: Diagnosis not present

## 2018-10-05 NOTE — Progress Notes (Signed)
[name] ([id number]) Visit Report for 10/04/2018 Arrival Information Details Patient Name: [name], [name] Date of Service: 10/04/2018 8:30 AM Medical Record Number: [id number] Patient Account Number: [id number] Date of Birth/Sex: 06-21-1951 (67 y.o. M) Treating RN: Montey Hora Primary Care Vaudie Engebretsen: [name] Other Clinician: Referring Wealthy Danielski: [name] Treating Cheryel Kyte/Extender: [name], HOYT Weeks in Treatment: 62 Visit Information History Since Last Visit Added or deleted any medications: No Patient Arrived: Wheel Chair Any new allergies or adverse reactions: No Arrival Time: 08:38 Had a fall or experienced change in No Accompanied By: wife activities of daily living that may affect Transfer Assistance: None risk of falls: Patient Identification Verified: Yes Signs or symptoms of abuse/neglect since last visito No Secondary Verification Process Completed: Yes Hospitalized since last visit: No Patient Requires Transmission-Based No Implantable device outside of the clinic excluding No Precautions: cellular tissue based products placed in the center Patient Has Alerts: Yes since last visit: Patient Alerts: NOT Has Dressing in Place as Prescribed: Yes Diabetic Pain Present Now: No Electronic Signature(s) Signed: 10/04/2018 4:22:24 PM By: Montey Hora Entered By: Montey Hora on 10/04/2018 08:40:48 [name] ([id number]) -------------------------------------------------------------------------------- Encounter Discharge Information Details Patient Name: [name] Date of Service: 10/04/2018 8:30 AM Medical Record Number: [id number] Patient Account Number: [id number] Date of Birth/Sex: 1950/12/12 (67 y.o. M) Treating RN: Army Melia Primary Care Pietra Zuluaga: [name] Other Clinician: Referring Burnett Spray: [name] Treating Agata Lucente/Extender: [name], HOYT Weeks in Treatment: 46 Encounter  Discharge Information Items Discharge Condition: Stable Ambulatory Status: Wheelchair Discharge Destination: Home Transportation: Private Auto Accompanied By: wife Schedule Follow-up Appointment: Yes Clinical Summary of Care: Electronic Signature(s) Signed: 10/04/2018 10:05:08 AM By: Army Melia Entered By: Army Melia on 10/04/2018 09:49:08 [name] ([id number]) -------------------------------------------------------------------------------- Lower Extremity Assessment Details Patient Name: [name] Date of Service: 10/04/2018 8:30 AM Medical Record Number: [id number] Patient Account Number: [id number] Date of Birth/Sex: [birth year] (67 y.o. M) Treating RN: Montey Hora Primary Care Amylah Will: [name] Other Clinician: Referring Ozzy Bohlken: [name] Treating Deva Ron/Extender: [name], HOYT Weeks in Treatment: 44 Electronic Signature(s) Signed: 10/04/2018 4:22:24 PM By: Montey Hora Entered By: Montey Hora on 10/04/2018 08:41:00 LOIC, HOBIN ([id number]) -------------------------------------------------------------------------------- Multi Wound Chart Details Patient Name: [name] Date of Service: 10/04/2018 8:30 AM Medical Record Number: [id number] Patient Account Number: [id number] Date of Birth/Sex: 05-12-1951 (67 y.o. M) Treating RN: Harold Barban Primary Care Piedad Standiford: [name] Other Clinician: Referring Georgine Wiltse: [name] Treating Wava Kildow/Extender: [name], HOYT Weeks in Treatment: 44 Vital Signs Height(in): 73 Pulse(bpm): 77 Weight(lbs): 210 Blood Pressure(mmHg): 142/69 Body Mass Index(BMI): 28 Temperature(F): 98.2 Respiratory Rate 16 (breaths/min): Photos: [1:No Photos] [N/A:N/A] Wound Location: [1:Right Gluteal fold] [N/A:N/A] Wounding Event: [1:Pressure Injury] [N/A:N/A] Primary Etiology: [1:Pressure Ulcer] [N/A:N/A] Comorbid History: [1:Cataracts, Hypertension, Paraplegia]  [N/A:N/A] Date Acquired: [1:11/02/2017] [N/A:N/A] Weeks of Treatment: [1:44] [N/A:N/A] Wound Status: [1:Open] [N/A:N/A] Measurements L x W x D [1:3.8x1.7x3.5] [N/A:N/A] (cm) Area (cm) : [1:5.074] [N/A:N/A] Volume (cm) : [1:17.758] [N/A:N/A] % Reduction in Area: [1:49.50%] [N/A:N/A] % Reduction in Volume: [1:-1667.00%] [N/A:N/A] Classification: [1:Category/Stage IV] [N/A:N/A] Exudate Amount: [1:Medium] [N/A:N/A] Exudate Type: [1:Serous] [N/A:N/A] Exudate Color: [1:amber] [N/A:N/A] Wound Margin: [1:Epibole] [N/A:N/A] Granulation Amount: [1:Medium (34-66%)] [N/A:N/A] Granulation Quality: [1:Red, Pink, Hyper-granulation] [N/A:N/A] Necrotic Amount: [1:Medium (34-66%)] [N/A:N/A] Exposed Structures: [1:Fat Layer (Subcutaneous Tissue) Exposed: Yes Fascia: No Tendon: No Muscle: No Joint: No Bone: No] [N/A:N/A] Epithelialization: [1:Small (1-33%)] [N/A:N/A] Periwound Skin Texture: [1:Rash: Yes Excoriation: No Induration: No Callus: No Crepitus: No Scarring: No] [N/A:N/A] Periwound Skin Moisture:  Maceration: Yes N/A N/A Dry/Scaly: No Periwound Skin Color: Atrophie Blanche: No N/A N/A Cyanosis: No Ecchymosis: No Erythema: No Hemosiderin Staining: No Mottled: No Pallor: No Rubor: No Temperature: No Abnormality N/A N/A Tenderness on Palpation: No N/A N/A Wound Preparation: Ulcer Cleansing: N/A N/A Rinsed/Irrigated with Saline Topical Anesthetic Applied: Other: lidocaine 4% Treatment Notes Electronic Signature(s) Signed: 10/04/2018 5:03:00 PM By: Harold Barban Entered By: Harold Barban on 10/04/2018 09:06:39 GABRIELLE, MESTER ([id number]) -------------------------------------------------------------------------------- [place] Details Patient Name: [name] Date of Service: 10/04/2018 8:30 AM Medical Record Number: [id number] Patient Account Number: [id number] Date of Birth/Sex: 20-Oct-1950 (67 y.o. M) Treating RN: Harold Barban Primary Care  Vue Pavon: [name] Other Clinician: Referring Mayson Mcneish: [name] Treating Acadia Thammavong/Extender: [name], HOYT Weeks in Treatment: 31 Active Inactive Orientation to the Wound Care Program Nursing Diagnoses: Knowledge deficit related to the wound healing center program Goals: Patient/caregiver will verbalize understanding of the [facility] Program Date Initiated: 11/30/2017 Target Resolution Date: 12/21/2017 Goal Status: Active Interventions: Provide education on orientation to the wound center Notes: Pressure Nursing Diagnoses: Knowledge deficit related to management of pressures ulcers Goals: Patient/caregiver will verbalize understanding of pressure ulcer management Date Initiated: 05/17/2018 Target Resolution Date: 05/28/2018 Goal Status: Active Interventions: Assess: immobility, friction, shearing, incontinence upon admission and as needed Notes: Wound/Skin Impairment Nursing Diagnoses: Impaired tissue integrity Goals: Patient/caregiver will verbalize understanding of skin care regimen Date Initiated: 11/30/2017 Target Resolution Date: 12/21/2017 Goal Status: Active Ulcer/skin breakdown will have a volume reduction of 30% by week 4 Date Initiated: 11/30/2017 Target Resolution Date: 12/21/2017 Goal Status: Active [name], [name] ([id number]) Interventions: Assess patient/caregiver ability to obtain necessary supplies Assess patient/caregiver ability to perform ulcer/skin care regimen upon admission and as needed Assess ulceration(s) every visit Treatment Activities: Skin care regimen initiated : 11/30/2017 Notes: Electronic Signature(s) Signed: 10/04/2018 5:03:00 PM By: Harold Barban Entered By: Harold Barban on 10/04/2018 09:06:32 [name] ([id number]) -------------------------------------------------------------------------------- Pain Assessment Details Patient Name: [name] Date of Service: 10/04/2018 8:30  AM Medical Record Number: [id number] Patient Account Number: [id number] Date of Birth/Sex: 1951-07-07 (67 y.o. M) Treating RN: Montey Hora Primary Care Hasten Sweitzer: [name] Other Clinician: Referring Niquita Digioia: [name] Treating Christina Waldrop/Extender: [name], HOYT Weeks in Treatment: 41 Active Problems Location of Pain Severity and Description of Pain Patient Has Paino No Site Locations Pain Management and Medication Current Pain Management: Electronic Signature(s) Signed: 10/04/2018 4:22:24 PM By: Montey Hora Entered By: Montey Hora on 10/04/2018 08:41:08 [name] ([id number]) -------------------------------------------------------------------------------- Patient/Caregiver Education Details Patient Name: [name] Date of Service: 10/04/2018 8:30 AM Medical Record Number: [id number] Patient Account Number: [id number] Date of Birth/Gender: [birth year] (67 y.o. M) Treating RN: Harold Barban Primary Care Physician: [name] Other Clinician: Referring Physician: [name] Treating Physician/Extender: Sharalyn Ink in Treatment: 48 Education Assessment Education Provided To: Patient Education Topics Provided Wound/Skin Impairment: Handouts: Caring for Your Ulcer Methods: Demonstration, Explain/Verbal Responses: State content correctly Electronic Signature(s) Signed: 10/04/2018 5:03:00 PM By: Harold Barban Entered By: Harold Barban on 10/04/2018 09:07:07 [name] ([id number]) -------------------------------------------------------------------------------- Wound Assessment Details Patient Name: [name] Date of Service: 10/04/2018 8:30 AM Medical Record Number: [id number] Patient Account Number: [id number] Date of Birth/Sex: May 26, 1951 (67 y.o. M) Treating RN: Montey Hora Primary Care Trevin Gartrell: [name] Other Clinician: Referring Tomekia Helton: [name] Treating  Hue Frick/Extender: [name], HOYT Weeks in Treatment: 44 Wound Status Wound Number: 1 Primary Etiology: Pressure Ulcer Wound Location: Right Gluteal fold Wound Status: Open Wounding Event:  Pressure Injury Comorbid History: Cataracts, Hypertension, Paraplegia Date Acquired: 11/02/2017 Weeks Of Treatment: 44 Clustered Wound: No Photos Photo Uploaded By: Montey Hora on 10/04/2018 09:10:13 Wound Measurements Length: (cm) 3.8 Width: (cm) 1.7 Depth: (cm) 3.5 Area: (cm) 5.074 Volume: (cm) 17.758 % Reduction in Area: 49.5% % Reduction in Volume: -1667% Epithelialization: Small (1-33%) Tunneling: No Undermining: No Wound Description Classification: Category/Stage IV Wound Margin: Epibole Exudate Amount: Medium Exudate Type: Serous Exudate Color: amber Foul Odor After Cleansing: No Slough/Fibrino Yes Wound Bed Granulation Amount: Medium (34-66%) Exposed Structure Granulation Quality: Red, Pink, Hyper-granulation Fascia Exposed: No Necrotic Amount: Medium (34-66%) Fat Layer (Subcutaneous Tissue) Exposed: Yes Necrotic Quality: Adherent Slough Tendon Exposed: No Muscle Exposed: No Joint Exposed: No Bone Exposed: No Periwound Skin Texture Cessna, Nayan J. ([id number]) Texture Color No Abnormalities Noted: No No Abnormalities Noted: No Callus: No Atrophie Blanche: No Crepitus: No Cyanosis: No Excoriation: No Ecchymosis: No Induration: No Erythema: No Rash: Yes Hemosiderin Staining: No Scarring: No Mottled: No Pallor: No Moisture Rubor: No No Abnormalities Noted: No Dry / Scaly: No Temperature / Pain Maceration: Yes Temperature: No Abnormality Wound Preparation Ulcer Cleansing: Rinsed/Irrigated with Saline Topical Anesthetic Applied: Other: lidocaine 4%, Treatment Notes Wound #1 (Right Gluteal fold) Notes dakins wet to dry, bordered foam dressing; SNF nurses to reapply NPWT Electronic Signature(s) Signed: 10/04/2018 4:22:24 PM By: Montey Hora Entered By: Montey Hora on 10/04/2018 08:48:12 CASS, VANDERMEULEN ([id number]) -------------------------------------------------------------------------------- [place] Details Patient Name: [name] Date of Service: 10/04/2018 8:30 AM Medical Record Number: [id number] Patient Account Number: [id number] Date of Birth/Sex: 20-Oct-1950 (67 y.o. M) Treating RN: Montey Hora Primary Care Flora Ratz: [name] Other Clinician: Referring [place]: [name] Treating Mignon Bechler/Extender: [name], HOYT Weeks in Treatment: 44 Vital Signs Time Taken: 08:41 Temperature (F): 98.2 Height (in): 73 Pulse (bpm): 77 Weight (lbs): 210 Respiratory Rate (breaths/min): 16 Body Mass Index (BMI): 27.7 Blood Pressure (mmHg): 142/69 Reference Range: 80 - 120 mg / dl Electronic Signature(s) Signed: 10/04/2018 4:22:24 PM By: Montey Hora Entered By: Montey Hora on 10/04/2018 08:41:45

## 2018-10-06 ENCOUNTER — Encounter: Payer: Medicare Other | Admitting: General Surgery

## 2018-10-06 NOTE — Progress Notes (Signed)
Brett, [name] ([id number]) Visit Report for 10/04/2018 Chief Complaint Document Details Patient Name: [name], [name] Date of Service: 10/04/2018 8:30 AM Medical Record Number: [id number] Patient Account Number: [id number] Date of Birth/Sex: 03/05/1951 (67 y.o. M) Treating RN: Harold Barban Primary Care Provider: [name] Other Clinician: Referring Provider: [name] Treating Provider/Extender: [name], HOYT Weeks in Treatment: 89 Information Obtained from: Patient Chief Complaint Right gluteal fold ulcer Electronic Signature(s) Signed: 10/05/2018 2:10:29 PM By: Worthy Keeler [name] Entered By: Worthy Keeler on 10/04/2018 08:52:00 OCTAVE, MONTROSE ([id number]) -------------------------------------------------------------------------------- HPI Details Patient Name: [name] Date of Service: 10/04/2018 8:30 AM Medical Record Number: [id number] Patient Account Number: [id number] Date of Birth/Sex: [birth year] (67 y.o. 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 He states that there is no history of diabetes that he is aware of. He does have issues with his prostate and is currently receiving treatment for this by way of oral medication. With that being said I do not have a lot of details in that regard. Nonetheless the patient presents today as a result of having been referred to [place] by another provider initially home health was set to come out and take care of his wound although due to the fact that  he apparently drives he's not able to receive home health. His wife is therefore trying to help take care of this wound within although they have been struggling with what exactly to do at this point. She states that she can do some things but she is definitely not a nurse and does have some issues with looking at blood. The good news is the wound does not appear to be too deep and is fairly superficial at this point. There is no slough noted there is some nonviable skin noted around the surface of the wound and the perimeter at this point. The central portion of the wound appears to be very good with a dermal layer noted this does not appear to be again deep enough to extend it to subcutaneous tissue at this point. 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 In the pump he has Dilaudid and baclofen. 12/07/17 on evaluation today patient presents for follow-up concerning his ongoing lower back thigh ulcer on the right. He states that he did not get the supplies ordered and therefore has not really been able to perform the dressing changes as directed exactly. His wife was able to get some Boarder Foam Dressing's from the drugstore and subsequently has been using hydrogel which did help to a degree in the wound does appear to be able smaller. There is actually more drainage this week noted than previous. 12/21/17 on evaluation today patient appears to be doing rather well in regard to his right gluteal ulcer. He has been tolerating the dressing changes without complication. There does not appear to be any evidence of infection at this point in time. Overall the wound does seem to be making some progress as far as the edges  are concerned there's not as much in the way of overlapping of the external wound edges and he has a good epithelium to wound bed border for the most part. This however is not true  right at the 12 o'clock location over the span of a little over a centimeters which actually will require debridement today to clean this away and hopefully allow it to continue to heal more appropriately. 12/28/17 on evaluation today patient appears to be doing rather well in regard to his ulcer in the left gluteal region. He's been tolerating the dressing changes without complication. Apparently he has had some difficulty getting his dressing material. Apparently there's been some confusion with ordering we're gonna check into this. Nonetheless overall he's been showing signs of improvement which is good news. Debridement is not required today. 01/04/18 on evaluation today patient presents for follow-up concerning his right gluteal ulcer. He has been tolerating the dressing changes fairly well. On inspection today it appears he may actually have some maceration them concerned about the fact that he may be developing too much moisture in and around the wound bed which can cause delay in healing. With that being said he unfortunately really has not showed significant signs of improvement since last week's evaluation in fact this may even be just the little bit/slightly larger. Nonetheless he's been having a lot of discomfort I'm not sure this is even related to the wound as he has no pain when I'm to breeding or otherwise cleaning the wound during evaluation today. Nonetheless this is something that we did recommend he talked to his pain specialist concerning. 01/11/18 on evaluation today patient appears to be doing better in regard to his ulceration. He has been tolerating the dressing changes without complication. With that being said overall there's no evidence of infection which is good news. The only thing is he did receive the hatch affair blue classic versus the ready nonetheless I feel like this is perfectly fine and appears to have done well for him over the past week. 01/25/18 on evaluation  today patient's wound actually appears to be a little bit larger than during the last evaluation. The good MOSES, ODOHERTY. ([id number]) news is the majority of the wound edges actually appear to be fairly firmly attached to the wound bed unfortunately again we're not really making progress in regard to the size. Roughly the wound is about the same size as when I first saw him although again the wound margin/edges appear to be much better. 02/01/18 on evaluation today patient actually appears to be doing very well in regard to his wound. Applying the Prisma dry does seem to be better although he does still have issues with slow progression of the wound. There was a slight improvement compared to last week's measurements today. Nonetheless I have been considering other options as far as the possibility of Theraskin or even a snap vac. In general I'm not sure that the Theraskin due to location of the wound would be a very good idea. Nonetheless I do think that a snap vac could be a possibility for the patient and in fact I think this could even be an excellent way to manage the wound possibly seeing some improvement in a very rapid fashion here. Nonetheless this is something that we would need to get approved and I did have a lengthy conversation with the patient about this today. 02/08/18 on evaluation today patient appears to be doing a little better in regard to his ulcer.  He has been tolerating the dressing changes without complication. Fortunately despite the fact that the wound is a little bit smaller it's not significantly so unfortunately. We have discussed the possibility of a snap vac we did check with insurance this is actually covered at this point. Fortunately there does not appear to be any sign of infection. Overall I'm fairly pleased with how things seem to be appearing at this point. 02/15/18 on evaluation today patient appears to be doing rather well in regard to his right gluteal  ulcer. Unfortunately the snap vac did not stay in place with his sheer and friction this came loose and did not seem to maintain seal very well. He worked for about two days and it did seem to do very well during that time according to his wife but in general this does not seem to be something that's gonna be beneficial for him long-term. I do believe we need to go back to standard dressings to see if we can find something that will be of benefit. 03/02/18- He is here in follow up evaluation; there is minimal change in the wound. He will continue with the same treatment plan, would consider changing to iodosrob/iodoflex if ulcer continues to to plateau. He will follow up next week 03/08/18 on evaluation today patient's wound actually appears to be about the same size as when I previously saw him several weeks back. Unfortunately he does have some slightly dark discoloration in the central portion of the wound which has me concerned about pressure injury. I do believe he may be sitting for too long a period of time in fact he tells me that "I probably sit for much too long". He does have some Slough noted on the surface of the wound and again as far as the size of the wound is concerned I'm really not seeing anything that seems to have improved significantly. 03/15/18 on evaluation today patient appears to be doing fairly well in regard to his ulcer. The wound measured pretty much about the same today compared to last week's evaluation when looking at his graph. With that being said the area of bruising/deep tissue injury that was noted last week I do not see at this point. He did get a new cushion fortunately this does seem to be have been of benefit in my pinion. It does appear that he's been off of this more which is good news as well I think that is definitely showing in the overall wound measurements. With that being said I do believe that he needs to continue to offload I don't think that the fact  this is doing better should be or is going to allow him to not have to offload and explain this to him as well. Overall he seems to be in agreement the plan I think he understands. The overall appearance of the wound bed is improved compared to last week I think the Iodoflex has been beneficial in that regard. 03/29/18 on evaluation today patient actually appears to be doing rather well in regard to his wound from the overall appearance standpoint he does have some granulation although there's some Slough on the surface of the wound noted as well. With that being said he unfortunately has not improved in regard to the overall measurement of the wound in volume or in size. I did have a discussion with him very specifically about offloading today. He actually does work although he mainly is just sitting throughout the day. He tells me  he offloads by "lifting himself up for 30 seconds off of his chair occasionally" purchase from [employer] which does seem to have helped. And he has a new cushion that he with that being said he's also able to stand some for a very short period of time but not significant enough I think to provide appropriate offloading. I think the biggest issue at this point with the wound and the fact is not healing as quickly as we would like is due to the fact that he is really not able to appropriately offload while at work. He states the beginning after his injury he actually had a bed at his job that he could lay on in order to offload and that does seem to have been of help back at that time. Nonetheless he had not done this in quite some time unfortunately. I think that could be helpful for him this is something I would like for him to look into. 04/05/18 on evaluation today patient actually presents for follow-up concerning his right gluteal ulcer. Again he really is not significantly improved even compared to last week. He has been tolerating the dressing changes without  complication. With that being said fortunately there appears to be no evidence of infection at this time. He has been more proactive in trying to offload. DEANNA, BOEHLKE ([id number]) 04/12/18 on evaluation today patient actually appears to be doing a little better in regard to his wound and the right gluteal fold region. He's been tolerating the dressing changes since removing the oasis without complication. However he was having a lot of burning initially with the oasis in place. He's unsure of exactly why this was given so much discomfort but he assumes that it was the oasis itself causing the problem. Nonetheless this had to be removed after about three days in place although even those three days seem to have made a fairly good improvement in regard to the overall appearance of the wound bed. In fact is the first time that he's made any improvement from the standpoint of measurements in about six weeks. He continues to have no discomfort over the area of the wound itself which leads me to wonder why he was having the burning with the oasis when he does not even feel the actual debridement's themselves. I am somewhat perplexed by this. 04/19/18 on evaluation today patient's wound actually appears to be showing signs of epithelialization around the edge of the wound and in general actually appears to be doing better which is good news. He did have the same burning after about three days with applying the Endoform last week in the same fashion that I would generally apply a skin substitute. This seems to indicate that it's not the oasis to cause the problem but potentially the moisture buildup that just causes things to burn or there may be some other reaction with the skin prep or Steri-Strips. Nonetheless I'm not sure that is gonna be able to tolerate any skin substitute for a long period of time. The good news is the wound actually appears to be doing better today compared to last week and  does seem to finally be making some progress. 04/26/18 on evaluation today patient actually appears to be doing rather well in regard to his ulcer in the right gluteal fold. He has been tolerating the dressing changes without complication which is good news. The Endoform does seem to be helping although he was a little bit more macerated this week. This  seems to be an ongoing issue with fluid control at this point. Nonetheless I think we may be able to add something like Drawtex to help control the drainage. 05/03/18 on evaluation today patient appears to actually be doing better in regard to the overall appearance of his wound. He has been tolerating the dressing changes without complication. Fortunately there appears to be no evidence of infection at this time. I really feel like his wound has shown signs as of today of turning around last week I thought so as well and definitely he could be seen in this week's overall appearance and measurements. In general I'm very pleased with the fact that he finally seems to be making a steady but sure progress. The patient likewise is very pleased. 05/17/18 on evaluation today patient appears to be doing more poorly unfortunately in regard to his ulcer. He has been tolerating the dressing changes without complication. With that being said he tells me that in the past couple of days he and his wife have noticed that we did not seem to be doing quite as well is getting dark near the center. Subsequently upon evaluation today the wound actually does appear to be doing worse compared to previous. He has been tolerating the dressing changes otherwise and he states that he is not been sitting up anymore than he was in the past from what he tells me. Still he has continued to work he states "I'm tired of dealing with this and if I have to just go home and lay in the bed all the time that's what I'll do". Nonetheless I am concerned about the fact that this wound does  appear to be deeper than what it was previous. 05/24/18 upon evaluation today patient actually presents after having been in the hospital due to what was presumed to be sepsis secondary to the wound infection. He had an elevated white blood cell count between 14 and 15. With that being said he does seem to be doing somewhat better now. His wound still is giving him some trouble nonetheless and he is obviously concerned about the fact likely talked about that this does seem to go more deeply than previously noted. I did review his wound culture which showed evidence of Staphylococcus aureus him and group B strep. Nonetheless he is on antibiotics, Levaquin, for this. Subsequently I did review his intake summary from the hospital as well. I also did look at the CT of the lumbar spine with contrast that was performed which showed no bone destruction to suggest lumbar disguises/osteomyelitis or sacral osteomyelitis. There was no paraspinal abscess. Nonetheless it appears this may have been more of just a soft tissue infection at this point which is good news. He still is nonetheless concerned about the wound which again I think is completely reasonable considering everything he's been through recently. 05/31/18 on evaluation today on evaluation today patient actually appears to be showing signs of his wound be a little bit deeper than what I would like to see. Fortunately he does not show any signs of significant infection although his temperature was 99 today he states he's been checking this at home and has not been elevated. Nonetheless with the undermining that I'm seeing at this point I am becoming more concerned about the wound I do think that offloading is a key factor here that is preventing the speedy recovery at this point. There does not appear to be any evidence of again over infection noted. He's been using  Santyl currently. 06/07/18 the patient presents today for follow-up evaluation  regarding the left ulcer in the gluteal region. He has been tolerating the Wound VAC fairly well. He is obviously very frustrated with this he states that to mean is really getting in his way. There does not appear to be any evidence of infection at this time he does have a little bit of odor I do not necessarily associate this with infection just something that we sometimes notice with Wound VAC therapy. With that being said I can definitely catch a tone of discontentment overall in the patient's demeanor today. This when he was previously in the hospital ARSHIA, RONDON. ([id number]) an CT scan was done of the lumbar region which did not reveal any signs of osteomyelitis. With that being said the pelvis in particular was not evaluated distinctly which means he could still have some osteonecrosis I. Nonetheless the Wound VAC was started on Thursday I do want to get this little bit more time before jumping to a CT scan of the pelvis although that is something that I might would recommend if were not see an improvement by that time. 06/14/18 on evaluation today patient actually appears to be doing about the same in regard to his right gluteal ulcer. Again he did have a CT scan of the lumbar spine unfortunately this did not include the pelvis. Nonetheless with the depth of the wound that I'm seeing today even despite the fact that I'm not seeing any evidence of overt cellulitis I believe there's a good chance that we may be dealing with osteomyelitis somewhere in the right Ischial region. No fevers, chills, nausea, or vomiting noted at this time. 06/21/18 on evaluation today patient actually appears to be doing about the same with regard to his wound. The tunnel at 6 o'clock really does not appear to be any deeper although it is a little bit wider. I think at this point you may want to start packing this with white phone. Unfortunately I have not got approval for the CT scan of the pelvis as of yet  due to the fact that Medicare apparently has been denied it due to the diagnosis codes not being appropriate according to Medicare for the test requested. With that being said the patient cannot have an MRI and therefore this is the only option that we have as far as testing is concerned. The patient has had infection and was on antibiotics and been added code for cellulitis of the bottom to see if this will be appropriate for getting the test approved. Nonetheless I'm concerned about the infection have been spread deeper into the Ischial region. 06/28/18 on evaluation today patient actually appears to be doing rather well all things considered in regard to the right gluteal ulcer. He has been tolerating the dressing changes without complication. With that being said the Wound VAC he states does have to be replaced almost every day or at least reinforced unfortunately. Patient actually has his CT scan later this morning we should have the results by tomorrow. 07/05/18 on evaluation today patient presents for follow-up concerning his right Ischial ulcer. He did see the surgeon Dr. Lysle Pearl last week. They were actually very happy with him and felt like he spent a tremendous amount of time with them as far as discussing his situation was concerned. In the end Dr. Lysle Pearl did contact me as well and determine that he would not recommend any surgical intervention at this point as he felt like  it would not be in the patient's best interest based on what he was seeing. He recommended a referral to infectious disease. Subsequently this is something that Dr. Ines Bloomer office is working on setting up for the patient. As far as evaluation today is concerned the patient's wound actually appears to be worse at this point. I am concerned about how things are progressing and specifically about infection. I do not feel like it's the deeper but the area of depth is definitely widening which does have me concerned. No  fevers, chills, nausea, or vomiting noted at this time. I think that we do need initiate antibiotic therapy the patient has an allow allergy to amoxicillin/penicillin he states that he gets a rash since childhood. Nonetheless she's never had the issues with Catholics or cephalosporins in general but he is aware of. 07/27/18 on evaluation today patient presents following admission to the hospital on 07/09/18. He was subsequently discharged on 07/20/18. On 07/15/18 the patient underwent irrigation and debridement was soft tissue biopsy and bone biopsy as well as placement of a Wound VAC in the OR by Dr. Celine Ahr. During the hospital course the patient was placed on a Wound VAC and recommended follow up with surgery in three weeks actually with Dr. Delaine Lame who is infectious disease. The patient was on vancomycin during the hospital course. 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 He also had a bone culture which revealed evidence of methicillin-resistant staph aureus. He is updated CT scan 07/09/18 reveals that he had progression of the which was performed on wound to breakdown down to the trochanter where he actually had irregularities there as well suggestive of osteomyelitis. This was a change just since 9 December when we last performed a CT scan. Obviously this one had gone downhill quite significantly and rapidly. At this point upon evaluation I feel like in general the patient's wound seems to be doing fairly well all things considered upon my evaluation today. Obviously this is larger and deeper than what I previously evaluated but at the same time he seems to be making some progress as far as the appearance of the granulation tissue is concerned. I'm happy in that regard. No fevers, chills, nausea, or vomiting noted at this time. He is on IV vancomycin and Rocephin at the facility. He is currently in [employer]. 08/03/18 upon evaluation today  patient's wound appears to be doing better in regard to the overall appearance at this point in time. Fortunately he's been tolerating the Wound VAC without complication and states that the facility has been taking excellent care of the wound site. Overall I see some Slough noted on the surface which I am going to attempt sharp debridement today of but nonetheless other than this I feel like he's making progress. 08/09/18 on evaluation today patient's wound appears to be doing much better compared to even last week's evaluation. Do believe that the Wound VAC is been of great benefit for him. He has been tolerating the dressing changes that is the Wound LAREN, ESTRIDGE. ([id number]) VAC without any complication and he has excellent granulation noted currently. There is no need for sharp debridement at this point. 08/16/18 on evaluation today patient actually appears to be doing very well in regard to the wound in the right gluteal fold region. This is showing signs of progress and again appears to be very healthy which is excellent news. Fortunately there is no sign of active infection by way of odor  or drainage at this point. Overall I'm very pleased with how things stand. He seems to be tolerating the Wound VAC without complication. 08/23/18 on evaluation today patient actually appears to be doing better in regard to his wound. He has been tolerating the Wound VAC without complication and in fact it has been collecting a significant amount of drainage which I think is good news especially considering how the wound appears. Fortunately there is no signs of infection at this time definitely nothing appears to be worse which is good news. He has not been started on the Bactrim and Flagyl that was recommended by Dr. Delaine Lame yet. I did actually contact her office this morning in order to check and see were things are that regard their gonna be calling me back. 08/30/18 on evaluation today patient  actually appears to show signs of excellent improvement today compared to last evaluation. The undermining is getting much better the wound seems to be feeling quite nicely and I'm very pleased that the granulation in general. With that being said overall I feel like the patient has made excellent progress which is great news. No fevers, chills, nausea, or vomiting noted at this time. 09/06/18 on evaluation today patient actually appears to be doing rather well in regard to his right gluteal ulcer. This is showing signs of improvement in overall I'm very pleased with how things seem to be progressing. The patient likewise is please. Overall I see no evidence of infection he is about to complete his oral antibiotic regimen which is the end of the antibiotics for him in just about three days. 09/13/18 on evaluation today patient's right Ischial ulcer appears to be showing signs of continued improvement which is excellent news. He's been tolerating the dressing changes without complication. Fortunately there's no signs of infection and the wound that seems to be doing very well. 09/28/18 on evaluation today patient appears to be doing rather well in regard to his right Ischial ulcer. He's been tolerating the Wound VAC without complication he knows there's much less drainage than there used to be this obviously is not a bad thing in my pinion. There's no evidence of infection despite the fact is but nothing about it now for several weeks. 10/04/18 on evaluation today patient appears to be doing better in regard to his right Ischial wound. He has been tolerating the Wound VAC without complication and I do believe that the silver nitrate last week was beneficial for him. Fortunately overall there's no evidence of active infection at this time which is great news. No fevers, chills, nausea, or vomiting noted at this time. Electronic Signature(s) Signed: 10/05/2018 2:10:29 PM By: Worthy Keeler [name] Entered  By: Worthy Keeler on 10/05/2018 09:40:43 Vickrey, [name] ([id number]) -------------------------------------------------------------------------------- [facility] Details Patient Name: [name] Date of Service: 10/04/2018 8:30 AM Medical Record Number: [id number] Patient Account Number: [id number] Date of Birth/Sex: Nov 06, 1950 (67 y.o. M) Treating RN: Harold Barban Primary Care Provider: [name] Other Clinician: Referring Provider: [name] Treating Provider/Extender: [name], HOYT Weeks in Treatment: 47 Procedure Performed for: Wound #1 Right Gluteal fold Performed By: Physician Emilio Math., [name] Post Procedure Diagnosis Same as Pre-procedure Electronic Signature(s) Signed: 10/04/2018 5:03:00 PM By: Harold Barban Entered By: Harold Barban on 10/04/2018 09:10:06 [name] ([id number]) -------------------------------------------------------------------------------- Physical Exam Details Patient Name: [name] Date of Service: 10/04/2018 8:30 AM Medical Record Number: [id number] Patient Account Number: [id number] Date of Birth/Sex: 09/30/1950 (67 y.o.  M) Treating RN: Harold Barban Primary Care Provider: [name] Other Clinician: Referring Provider: [name] Treating Provider/Extender: [name], HOYT Weeks in Treatment: 64 Constitutional Well-nourished and well-hydrated in no acute distress. Respiratory normal breathing without difficulty. clear to auscultation bilaterally. Cardiovascular regular rate and rhythm with normal S1, S2. Psychiatric this patient is able to make decisions and demonstrates good insight into disease process. Alert and Oriented x 3. pleasant and cooperative. Notes Patient's wound currently did not require sharp agreement the pump cauterization with silver nitrate was performed today without complication I hope that this will continue to flatten out wound  bed and allow for a more even surface with less hyper granulation which will promote better healing. Electronic Signature(s) Signed: 10/05/2018 2:10:29 PM By: Worthy Keeler [name] Entered By: Worthy Keeler on 10/05/2018 09:46:48 DAGEN, BEEVERS ([id number]) -------------------------------------------------------------------------------- Physician Orders Details Patient Name: [name] Date of Service: 10/04/2018 8:30 AM Medical Record Number: [id number] Patient Account Number: [id number] Date of Birth/Sex: Oct 30, 1950 (67 y.o. 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 Primary Wound Dressing Wound #1 Right Gluteal fold o Dry Gauze - wet to dry in clinic until SNF restarts NPWT Secondary Dressing Wound #1 Right Gluteal fold o Boardered Foam Dressing - in clinic until SNF restarts NPWT Dressing Change Frequency Wound #1 Right Gluteal fold o Change Dressing Monday, Wednesday, Friday - and as needed Follow-up Appointments Wound #1 Right Gluteal fold o Return Appointment in 1 week. Off-Loading Wound #1 Right Gluteal fold o Mattress - Please continue air mattress at SNF o Turn and reposition every 2 hours Negative Pressure Wound Therapy Wound #1 Right Gluteal  fold Frein, Dadrian J. ([id number]) o Wound VAC settings at 125/130 mmHg continuous pressure. Use BLACK/GREEN foam to wound cavity. Use WHITE foam to fill any tunnel/s and/or undermining. Change VAC dressing 3 X WEEK. Change canister as indicated when full. Nurse may titrate settings and frequency of dressing changes as clinically indicated. - Apply Silver Collagen to line the base of the wound. White foam to any tunnels o Home Health Nurse may d/c VAC for s/s of increased infection, significant wound regression, or uncontrolled drainage. [place] [facility] at [phone]. o Number of foam/gauze pieces used in the dressing = Electronic Signature(s) Signed: 10/04/2018 5:03:00 PM By: Harold Barban Signed: 10/05/2018 2:10:29 PM By: Worthy Keeler [name] Entered By: Harold Barban on 10/04/2018 09:17:14 DARIEL, BETZER ([id number]) -------------------------------------------------------------------------------- Problem List Details Patient Name: [name] Date of Service: 10/04/2018 8:30 AM Medical Record Number: [id number] Patient Account Number: [id number] Date of Birth/Sex: June 29, 1951 (67 y.o. M) Treating RN: Harold Barban Primary Care Provider: [name] Other Clinician: Referring Provider: [name] Treating Provider/Extender: [name], HOYT Weeks in Treatment: 65 Active Problems ICD-10 Evaluated Encounter Code Description Active Date Today Diagnosis L89.314 Pressure ulcer of right buttock, stage 4 11/30/2017 No Yes L03.317 Cellulitis of buttock 06/21/2018 No Yes G82.20 Paraplegia, unspecified 11/30/2017  No Yes S34.109S Unspecified injury to unspecified level of lumbar spinal cord, 11/30/2017 No Yes sequela I10 Essential (primary) hypertension 11/30/2017 No Yes Inactive Problems Resolved Problems Electronic Signature(s) Signed: 10/05/2018 2:10:29 PM By: Worthy Keeler [name] Entered By: Worthy Keeler on 10/04/2018 08:51:51 JERMY, COUPER ([id number]) -------------------------------------------------------------------------------- Progress Note Details Patient Name: [name] Date of Service: 10/04/2018 8:30 AM Medical Record Number: [id number] Patient Account Number: [id number] Date of Birth/Sex: 1951/03/20 (67 y.o. 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 He states that there is no history of diabetes that he is aware of. He does have issues with his prostate and is currently receiving treatment for this by way of oral medication. With that being said I do not have a lot of details in that regard. Nonetheless the patient presents today as a result of having been referred to [place] by another provider initially home health was set to come out and take care of his wound although due to the fact that he apparently drives he's not able to receive home health. His wife is therefore trying to help take care of this wound within although they have been struggling with what exactly to do at this point. She states that she can do some things but she is definitely not a nurse and does have some issues with looking at blood. The good news is the wound does not appear to be too deep and is fairly superficial at this point. There is no slough noted there is some nonviable skin noted around the surface of the wound and the perimeter at this point. The central portion of the wound appears to be very  good with a dermal layer noted this does not appear to be again deep enough to extend it to subcutaneous tissue at this point. 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 In the pump he has Dilaudid and baclofen. 12/07/17 on evaluation today patient presents for follow-up concerning his ongoing lower back thigh ulcer on the right. He states that he did not get the supplies ordered and therefore has not really been able to perform the dressing changes as directed exactly. His wife was able to get some Boarder Foam Dressing's from the drugstore and subsequently has been using hydrogel which did help to a degree in the wound does appear to be able smaller. There is actually more drainage this week noted than previous. 12/21/17 on evaluation today patient appears to be doing rather well in regard to his right gluteal ulcer. He has been tolerating the dressing changes without complication. There does not appear to be any evidence of infection at this point in time. Overall the wound does seem to be making some progress as far as the edges are concerned there's not as much in the way of overlapping of the external wound edges and he has a good epithelium to wound bed border for the most part. This however is  not true right at the 12 o'clock location over the span of a little over a centimeters which actually will require debridement today to clean this away and hopefully allow it to continue to heal more appropriately. 12/28/17 on evaluation today patient appears to be doing rather well in regard to his ulcer in the left gluteal region. He's been tolerating the dressing changes without complication. Apparently he has had some difficulty getting his dressing material. Apparently there's been some confusion with ordering we're gonna check into this. Nonetheless overall he's been showing signs of improvement  which is good news. Debridement is not required today. 01/04/18 on evaluation today patient presents for follow-up concerning his right gluteal ulcer. He has been tolerating the dressing changes fairly well. On inspection today it appears he may actually have some maceration them concerned about the fact that he may be developing too much moisture in and around the wound bed which can cause delay in healing. With that being said he unfortunately really has not showed significant signs of improvement since last week's evaluation in fact this may even be just the little bit/slightly larger. Nonetheless he's been having a lot of discomfort I'm not sure this is even related to the wound as he has no pain when I'm to breeding or otherwise cleaning the wound during evaluation today. Nonetheless this is something that we did recommend he talked to his pain specialist concerning. 01/11/18 on evaluation today patient appears to be doing better in regard to his ulceration. He has been tolerating the dressing Wiegand, Sami J. ([id number]) changes without complication. With that being said overall there's no evidence of infection which is good news. The only thing is he did receive the hatch affair blue classic versus the ready nonetheless I feel like this is perfectly fine and appears to have done well for him over the past week. 01/25/18 on evaluation today patient's wound actually appears to be a little bit larger than during the last evaluation. The good news is the majority of the wound edges actually appear to be fairly firmly attached to the wound bed unfortunately again we're not really making progress in regard to the size. Roughly the wound is about the same size as when I first saw him although again the wound margin/edges appear to be much better. 02/01/18 on evaluation today patient actually appears to be doing very well in regard to his wound. Applying the Prisma dry does seem to be better although  he does still have issues with slow progression of the wound. There was a slight improvement compared to last week's measurements today. Nonetheless I have been considering other options as far as the possibility of Theraskin or even a snap vac. In general I'm not sure that the Theraskin due to location of the wound would be a very good idea. Nonetheless I do think that a snap vac could be a possibility for the patient and in fact I think this could even be an excellent way to manage the wound possibly seeing some improvement in a very rapid fashion here. Nonetheless this is something that we would need to get approved and I did have a lengthy conversation with the patient about this today. 02/08/18 on evaluation today patient appears to be doing a little better in regard to his ulcer. He has been tolerating the dressing changes without complication. Fortunately despite the fact that the wound is a little bit smaller it's not significantly so unfortunately. We have discussed the possibility of a  snap vac we did check with insurance this is actually covered at this point. Fortunately there does not appear to be any sign of infection. Overall I'm fairly pleased with how things seem to be appearing at this point. 02/15/18 on evaluation today patient appears to be doing rather well in regard to his right gluteal ulcer. Unfortunately the snap vac did not stay in place with his sheer and friction this came loose and did not seem to maintain seal very well. He worked for about two days and it did seem to do very well during that time according to his wife but in general this does not seem to be something that's gonna be beneficial for him long-term. I do believe we need to go back to standard dressings to see if we can find something that will be of benefit. 03/02/18- He is here in follow up evaluation; there is minimal change in the wound. He will continue with the same treatment plan, would consider  changing to iodosrob/iodoflex if ulcer continues to to plateau. He will follow up next week 03/08/18 on evaluation today patient's wound actually appears to be about the same size as when I previously saw him several weeks back. Unfortunately he does have some slightly dark discoloration in the central portion of the wound which has me concerned about pressure injury. I do believe he may be sitting for too long a period of time in fact he tells me that "I probably sit for much too long". He does have some Slough noted on the surface of the wound and again as far as the size of the wound is concerned I'm really not seeing anything that seems to have improved significantly. 03/15/18 on evaluation today patient appears to be doing fairly well in regard to his ulcer. The wound measured pretty much about the same today compared to last week's evaluation when looking at his graph. With that being said the area of bruising/deep tissue injury that was noted last week I do not see at this point. He did get a new cushion fortunately this does seem to be have been of benefit in my pinion. It does appear that he's been off of this more which is good news as well I think that is definitely showing in the overall wound measurements. With that being said I do believe that he needs to continue to offload I don't think that the fact this is doing better should be or is going to allow him to not have to offload and explain this to him as well. Overall he seems to be in agreement the plan I think he understands. The overall appearance of the wound bed is improved compared to last week I think the Iodoflex has been beneficial in that regard. 03/29/18 on evaluation today patient actually appears to be doing rather well in regard to his wound from the overall appearance standpoint he does have some granulation although there's some Slough on the surface of the wound noted as well. With that being said he unfortunately has not  improved in regard to the overall measurement of the wound in volume or in size. I did have a discussion with him very specifically about offloading today. He actually does work although he mainly is just sitting throughout the day. He tells me he offloads by "lifting himself up for 30 seconds off of his chair occasionally" purchase from [employer] which does seem to have helped. And he has a new cushion that he with  that being said he's also able to stand some for a very short period of time but not significant enough I think to provide appropriate offloading. I think the biggest issue at this point with the wound and the fact is not healing as quickly as we would like is due to the fact that he is really not able to appropriately offload while at work. He states the beginning after his injury he actually had a bed at his job that he could lay on in order to offload and that does seem to have been of help back at that time. Nonetheless he had not done this in quite some time unfortunately. I think that could be helpful for him this is something I would like for him to look into. RACER, QUAM ([id number]) 04/05/18 on evaluation today patient actually presents for follow-up concerning his right gluteal ulcer. Again he really is not significantly improved even compared to last week. He has been tolerating the dressing changes without complication. With that being said fortunately there appears to be no evidence of infection at this time. He has been more proactive in trying to offload. 04/12/18 on evaluation today patient actually appears to be doing a little better in regard to his wound and the right gluteal fold region. He's been tolerating the dressing changes since removing the oasis without complication. However he was having a lot of burning initially with the oasis in place. He's unsure of exactly why this was given so much discomfort but he assumes that it was the oasis itself  causing the problem. Nonetheless this had to be removed after about three days in place although even those three days seem to have made a fairly good improvement in regard to the overall appearance of the wound bed. In fact is the first time that he's made any improvement from the standpoint of measurements in about six weeks. He continues to have no discomfort over the area of the wound itself which leads me to wonder why he was having the burning with the oasis when he does not even feel the actual debridement's themselves. I am somewhat perplexed by this. 04/19/18 on evaluation today patient's wound actually appears to be showing signs of epithelialization around the edge of the wound and in general actually appears to be doing better which is good news. He did have the same burning after about three days with applying the Endoform last week in the same fashion that I would generally apply a skin substitute. This seems to indicate that it's not the oasis to cause the problem but potentially the moisture buildup that just causes things to burn or there may be some other reaction with the skin prep or Steri-Strips. Nonetheless I'm not sure that is gonna be able to tolerate any skin substitute for a long period of time. The good news is the wound actually appears to be doing better today compared to last week and does seem to finally be making some progress. 04/26/18 on evaluation today patient actually appears to be doing rather well in regard to his ulcer in the right gluteal fold. He has been tolerating the dressing changes without complication which is good news. The Endoform does seem to be helping although he was a little bit more macerated this week. This seems to be an ongoing issue with fluid control at this point. Nonetheless I think we may be able to add something like Drawtex to help control the drainage. 05/03/18 on evaluation today patient  appears to actually be doing better in regard to  the overall appearance of his wound. He has been tolerating the dressing changes without complication. Fortunately there appears to be no evidence of infection at this time. I really feel like his wound has shown signs as of today of turning around last week I thought so as well and definitely he could be seen in this week's overall appearance and measurements. In general I'm very pleased with the fact that he finally seems to be making a steady but sure progress. The patient likewise is very pleased. 05/17/18 on evaluation today patient appears to be doing more poorly unfortunately in regard to his ulcer. He has been tolerating the dressing changes without complication. With that being said he tells me that in the past couple of days he and his wife have noticed that we did not seem to be doing quite as well is getting dark near the center. Subsequently upon evaluation today the wound actually does appear to be doing worse compared to previous. He has been tolerating the dressing changes otherwise and he states that he is not been sitting up anymore than he was in the past from what he tells me. Still he has continued to work he states "I'm tired of dealing with this and if I have to just go home and lay in the bed all the time that's what I'll do". Nonetheless I am concerned about the fact that this wound does appear to be deeper than what it was previous. 05/24/18 upon evaluation today patient actually presents after having been in the hospital due to what was presumed to be sepsis secondary to the wound infection. He had an elevated white blood cell count between 14 and 15. With that being said he does seem to be doing somewhat better now. His wound still is giving him some trouble nonetheless and he is obviously concerned about the fact likely talked about that this does seem to go more deeply than previously noted. I did review his wound culture which showed evidence of Staphylococcus aureus  him and group B strep. Nonetheless he is on antibiotics, Levaquin, for this. Subsequently I did review his intake summary from the hospital as well. I also did look at the CT of the lumbar spine with contrast that was performed which showed no bone destruction to suggest lumbar disguises/osteomyelitis or sacral osteomyelitis. There was no paraspinal abscess. Nonetheless it appears this may have been more of just a soft tissue infection at this point which is good news. He still is nonetheless concerned about the wound which again I think is completely reasonable considering everything he's been through recently. 05/31/18 on evaluation today on evaluation today patient actually appears to be showing signs of his wound be a little bit deeper than what I would like to see. Fortunately he does not show any signs of significant infection although his temperature was 99 today he states he's been checking this at home and has not been elevated. Nonetheless with the undermining that I'm seeing at this point I am becoming more concerned about the wound I do think that offloading is a key factor here that is preventing the speedy recovery at this point. There does not appear to be any evidence of again over infection noted. He's been using Santyl currently. KEDDRICK, WYNE ([id number]) 06/07/18 the patient presents today for follow-up evaluation regarding the left ulcer in the gluteal region. He has been tolerating the Wound VAC fairly well. He is  obviously very frustrated with this he states that to mean is really getting in his way. There does not appear to be any evidence of infection at this time he does have a little bit of odor I do not necessarily associate this with infection just something that we sometimes notice with Wound VAC therapy. With that being said I can definitely catch a tone of discontentment overall in the patient's demeanor today. This when he was previously in the hospital an CT  scan was done of the lumbar region which did not reveal any signs of osteomyelitis. With that being said the pelvis in particular was not evaluated distinctly which means he could still have some osteonecrosis I. Nonetheless the Wound VAC was started on Thursday I do want to get this little bit more time before jumping to a CT scan of the pelvis although that is something that I might would recommend if were not see an improvement by that time. 06/14/18 on evaluation today patient actually appears to be doing about the same in regard to his right gluteal ulcer. Again he did have a CT scan of the lumbar spine unfortunately this did not include the pelvis. Nonetheless with the depth of the wound that I'm seeing today even despite the fact that I'm not seeing any evidence of overt cellulitis I believe there's a good chance that we may be dealing with osteomyelitis somewhere in the right Ischial region. No fevers, chills, nausea, or vomiting noted at this time. 06/21/18 on evaluation today patient actually appears to be doing about the same with regard to his wound. The tunnel at 6 o'clock really does not appear to be any deeper although it is a little bit wider. I think at this point you may want to start packing this with white phone. Unfortunately I have not got approval for the CT scan of the pelvis as of yet due to the fact that Medicare apparently has been denied it due to the diagnosis codes not being appropriate according to Medicare for the test requested. With that being said the patient cannot have an MRI and therefore this is the only option that we have as far as testing is concerned. The patient has had infection and was on antibiotics and been added code for cellulitis of the bottom to see if this will be appropriate for getting the test approved. Nonetheless I'm concerned about the infection have been spread deeper into the Ischial region. 06/28/18 on evaluation today patient actually  appears to be doing rather well all things considered in regard to the right gluteal ulcer. He has been tolerating the dressing changes without complication. With that being said the Wound VAC he states does have to be replaced almost every day or at least reinforced unfortunately. Patient actually has his CT scan later this morning we should have the results by tomorrow. 07/05/18 on evaluation today patient presents for follow-up concerning his right Ischial ulcer. He did see the surgeon Dr. Lysle Pearl last week. They were actually very happy with him and felt like he spent a tremendous amount of time with them as far as discussing his situation was concerned. In the end Dr. Lysle Pearl did contact me as well and determine that he would not recommend any surgical intervention at this point as he felt like it would not be in the patient's best interest based on what he was seeing. He recommended a referral to infectious disease. Subsequently this is something that Dr. Ines Bloomer office is working on  setting up for the patient. As far as evaluation today is concerned the patient's wound actually appears to be worse at this point. I am concerned about how things are progressing and specifically about infection. I do not feel like it's the deeper but the area of depth is definitely widening which does have me concerned. No fevers, chills, nausea, or vomiting noted at this time. I think that we do need initiate antibiotic therapy the patient has an allow allergy to amoxicillin/penicillin he states that he gets a rash since childhood. Nonetheless she's never had the issues with Catholics or cephalosporins in general but he is aware of. 07/27/18 on evaluation today patient presents following admission to the hospital on 07/09/18. He was subsequently discharged on 07/20/18. On 07/15/18 the patient underwent irrigation and debridement was soft tissue biopsy and bone biopsy as well as placement of a Wound VAC in the OR by  Dr. Celine Ahr. During the hospital course the patient was placed on a Wound VAC and recommended follow up with surgery in three weeks actually with Dr. Delaine Lame who is infectious disease. The patient was on vancomycin during the hospital course. 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 He also had a bone culture which revealed evidence of methicillin-resistant staph aureus. He is updated CT scan 07/09/18 reveals that he had progression of the which was performed on wound to breakdown down to the trochanter where he actually had irregularities there as well suggestive of osteomyelitis. This was a change just since 9 December when we last performed a CT scan. Obviously this one had gone downhill quite significantly and rapidly. At this point upon evaluation I feel like in general the patient's wound seems to be doing fairly well all things considered upon my evaluation today. Obviously this is larger and deeper than what I previously evaluated but at the same time he seems to be making some progress as far as the appearance of the granulation tissue is concerned. I'm happy in that regard. No fevers, chills, nausea, or vomiting noted at this time. He is on IV vancomycin and Rocephin at the facility. He is currently in [employer]. 08/03/18 upon evaluation today patient's wound appears to be doing better in regard to the overall appearance at this point in time. Fortunately he's been tolerating the Wound VAC without complication and states that the facility has been taking DEANGLO, HISSONG. ([id number]) excellent care of the wound site. Overall I see some Slough noted on the surface which I am going to attempt sharp debridement today of but nonetheless other than this I feel like he's making progress. 08/09/18 on evaluation today patient's wound appears to be doing much better compared to even last week's evaluation. Do believe that the Wound VAC is been of  great benefit for him. He has been tolerating the dressing changes that is the Wound VAC without any complication and he has excellent granulation noted currently. There is no need for sharp debridement at this point. 08/16/18 on evaluation today patient actually appears to be doing very well in regard to the wound in the right gluteal fold region. This is showing signs of progress and again appears to be very healthy which is excellent news. Fortunately there is no sign of active infection by way of odor or drainage at this point. Overall I'm very pleased with how things stand. He seems to be tolerating the Wound VAC without complication. 08/23/18 on evaluation today patient actually appears to be doing  better in regard to his wound. He has been tolerating the Wound VAC without complication and in fact it has been collecting a significant amount of drainage which I think is good news especially considering how the wound appears. Fortunately there is no signs of infection at this time definitely nothing appears to be worse which is good news. He has not been started on the Bactrim and Flagyl that was recommended by Dr. Delaine Lame yet. I did actually contact her office this morning in order to check and see were things are that regard their gonna be calling me back. 08/30/18 on evaluation today patient actually appears to show signs of excellent improvement today compared to last evaluation. The undermining is getting much better the wound seems to be feeling quite nicely and I'm very pleased that the granulation in general. With that being said overall I feel like the patient has made excellent progress which is great news. No fevers, chills, nausea, or vomiting noted at this time. 09/06/18 on evaluation today patient actually appears to be doing rather well in regard to his right gluteal ulcer. This is showing signs of improvement in overall I'm very pleased with how things seem to be progressing. The  patient likewise is please. Overall I see no evidence of infection he is about to complete his oral antibiotic regimen which is the end of the antibiotics for him in just about three days. 09/13/18 on evaluation today patient's right Ischial ulcer appears to be showing signs of continued improvement which is excellent news. He's been tolerating the dressing changes without complication. Fortunately there's no signs of infection and the wound that seems to be doing very well. 09/28/18 on evaluation today patient appears to be doing rather well in regard to his right Ischial ulcer. He's been tolerating the Wound VAC without complication he knows there's much less drainage than there used to be this obviously is not a bad thing in my pinion. There's no evidence of infection despite the fact is but nothing about it now for several weeks. 10/04/18 on evaluation today patient appears to be doing better in regard to his right Ischial wound. He has been tolerating the Wound VAC without complication and I do believe that the silver nitrate last week was beneficial for him. Fortunately overall there's no evidence of active infection at this time which is great news. No fevers, chills, nausea, or vomiting noted at this time. Patient History Information obtained from Patient. Family History Hypertension - Father, Stroke - Mother, No family history of Cancer, Diabetes, Heart Disease, Kidney Disease, Lung Disease, Seizures, Thyroid Problems, Tuberculosis. Social History Never smoker, Marital Status - Married, Alcohol Use - Never, Drug Use - No History, Caffeine Use - Daily. Medical History Eyes Patient has history of Cataracts - both removed Denies history of Glaucoma, Optic Neuritis Ear/Nose/Mouth/Throat TRAVOR, ROYCE ([id number]) Denies history of Chronic sinus problems/congestion, Middle ear problems Hematologic/Lymphatic Denies history of Anemia, Hemophilia, Human Immunodeficiency Virus,  Lymphedema Respiratory Denies history of Aspiration, Asthma, Chronic Obstructive Pulmonary Disease (COPD), Pneumothorax, Sleep Apnea, Tuberculosis Cardiovascular Patient has history of Hypertension - takes medication Denies history of Angina, Arrhythmia, Congestive Heart Failure, Coronary Artery Disease, Deep Vein Thrombosis, Hypotension, Myocardial Infarction, Peripheral Arterial Disease, Peripheral Venous Disease, Phlebitis, Vasculitis Gastrointestinal Denies history of Cirrhosis , Colitis, Crohn s, Hepatitis A, Hepatitis B, Hepatitis C Endocrine Denies history of Type I Diabetes, Type II Diabetes Genitourinary Denies history of End Stage Renal Disease Immunological Denies history of Lupus Erythematosus, Raynaud s, Scleroderma  Integumentary (Skin) Denies history of History of Burn, History of pressure wounds Musculoskeletal Denies history of Gout, Rheumatoid Arthritis, Osteoarthritis, Osteomyelitis Neurologic Patient has history of Paraplegia - waist down Denies history of Dementia, Neuropathy, Quadriplegia, Seizure Disorder Oncologic Denies history of Received Chemotherapy, Received Radiation Psychiatric Denies history of Anorexia/bulimia, Confinement Anxiety Medical And Surgical History Notes Oncologic Prostate cancer- currently treated with horomone therapy Review of Systems (ROS) Constitutional Symptoms (General Health) Denies complaints or symptoms of Fever, Chills. Respiratory The patient has no complaints or symptoms. Cardiovascular The patient has no complaints or symptoms. Psychiatric The patient has no complaints or symptoms. Objective Constitutional Well-nourished and well-hydrated in no acute distress. Vitals Time Taken: 8:41 AM, Height: 73 in, Weight: 210 lbs, BMI: 27.7, Temperature: 98.2 F, Pulse: 77 bpm, Respiratory Rate: 16 breaths/min, Blood Pressure: 142/69 mmHg. INDIGO, CHADDOCK ([id number]) Respiratory normal breathing without difficulty. clear  to auscultation bilaterally. Cardiovascular regular rate and rhythm with normal S1, S2. Psychiatric this patient is able to make decisions and demonstrates good insight into disease process. Alert and Oriented x 3. pleasant and cooperative. General Notes: Patient's wound currently did not require sharp agreement the pump cauterization with silver nitrate was performed today without complication I hope that this will continue to flatten out wound bed and allow for a more even surface with less hyper granulation which will promote better healing. Integumentary (Hair, Skin) Wound #1 status is Open. Original cause of wound was Pressure Injury. The wound is located on the Right Gluteal fold. The wound measures 3.8cm length x 1.7cm width x 3.5cm depth; 5.074cm^2 area and 17.758cm^3 volume. There is Fat Layer (Subcutaneous Tissue) Exposed exposed. There is no tunneling or undermining noted. There is a medium amount of serous drainage noted. The wound margin is epibole. There is medium (34-66%) red, pink, hyper - granulation within the wound bed. There is a medium (34-66%) amount of necrotic tissue within the wound bed including Adherent Slough. The periwound skin appearance exhibited: Rash, Maceration. The periwound skin appearance did not exhibit: Callus, Crepitus, Excoriation, Induration, Scarring, Dry/Scaly, Atrophie Blanche, Cyanosis, Ecchymosis, Hemosiderin Staining, Mottled, Pallor, Rubor, Erythema. Periwound temperature was noted as No Abnormality. 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 An CHEM CAUT GRANULATION TISS procedure was performed by [name], HOYT E., [name]. Post procedure Diagnosis  Wound #1: Same as Pre-Procedure DAKWON, WENBERG ([id number]) Plan Wound Cleansing: Wound #1 Right Gluteal fold: Cleanse wound with mild soap and water Anesthetic (add to Medication List): Wound #1 Right Gluteal fold: Topical Lidocaine 4% cream applied to wound bed prior to debridement (In Clinic Only). Primary Wound Dressing: Wound #1 Right Gluteal fold: Dry Gauze - wet to dry in clinic until SNF restarts NPWT Secondary Dressing: Wound #1 Right Gluteal fold: Boardered Foam Dressing - in clinic until SNF restarts NPWT Dressing Change Frequency: Wound #1 Right Gluteal fold: Change Dressing Monday, Wednesday, Friday - and as needed Follow-up Appointments: Wound #1 Right Gluteal fold: Return Appointment in 1 week. Off-Loading: Wound #1 Right Gluteal fold: Mattress - Please continue air mattress at SNF Turn and reposition every 2 hours Negative Pressure Wound Therapy: Wound #1 Right Gluteal fold: Wound VAC settings at 125/130 mmHg continuous pressure. Use BLACK/GREEN foam to wound cavity. Use WHITE foam to fill any tunnel/s  and/or undermining. Change VAC dressing 3 X WEEK. Change canister as indicated when full. Nurse may titrate settings and frequency of dressing changes as clinically indicated. - Apply Silver Collagen to line the base of the wound. White foam to any tunnels Home Health Nurse may d/c VAC for s/s of increased infection, significant wound regression, or uncontrolled drainage. [place] at [phone]. Number of foam/gauze pieces used in the dressing = My suggestion currently is gonna be that we continue with the above wound care measures for the next week that the patient is in agreement with plan. We will subsequently see were things stand at follow-up. If anything changes or worsens meantime he will contact the office let me know. Please see above for specific wound care orders. We will see patient for re-evaluation in 1 week(s) here in the  clinic. If anything worsens or changes patient will contact our office for additional recommendations. Electronic Signature(s) Signed: 10/05/2018 2:10:29 PM By: Worthy Keeler [name] Entered By: Worthy Keeler on 10/05/2018 09:47:00 TREYSEAN, PETRUZZI ([id number]) -------------------------------------------------------------------------------- ROS/PFSH Details Patient Name: [name] Date of Service: 10/04/2018 8:30 AM Medical Record Number: [id number] Patient Account Number: [id number] Date of Birth/Sex: 08/09/1950 (67 y.o. M) Treating RN: Harold Barban Primary Care Provider: [name] Other Clinician: Referring Provider: [name] Treating Provider/Extender: [name], HOYT Weeks in Treatment: 76 Information Obtained From Patient Wound History Do you currently have one or more open woundso Yes How many open wounds do you currently haveo 1 Approximately how long have you had your woundso 1 month How have you been treating your wound(s) until nowo cream Has your wound(s) ever healed and then re-openedo No Have you had any lab work done in the past montho No Have you tested positive for an antibiotic resistant organism (MRSA, VRE)o No Have you tested positive for osteomyelitis (bone infection)o No Have you had any tests for circulation on your legso No Constitutional Symptoms (General Health) Complaints and Symptoms: Negative for: Fever; Chills Eyes Medical History: Positive for: Cataracts - both removed Negative for: Glaucoma; Optic Neuritis Ear/Nose/Mouth/Throat Medical History: Negative for: Chronic sinus problems/congestion; Middle ear problems Hematologic/Lymphatic Medical History: Negative for: Anemia; Hemophilia; Human Immunodeficiency Virus; Lymphedema Respiratory Complaints and Symptoms: No Complaints or Symptoms Medical History: Negative for: Aspiration; Asthma; Chronic Obstructive Pulmonary Disease (COPD); Pneumothorax; Sleep  Apnea; Tuberculosis Cardiovascular Complaints and Symptoms: No Complaints or Symptoms Medical History: SATURNINO, LIEW ([id number]) Positive for: Hypertension - takes medication Negative for: Angina; Arrhythmia; Congestive Heart Failure; Coronary Artery Disease; Deep Vein Thrombosis; Hypotension; Myocardial Infarction; Peripheral Arterial Disease; Peripheral Venous Disease; Phlebitis; Vasculitis Gastrointestinal Medical History: Negative for: Cirrhosis ; Colitis; Crohnos; Hepatitis A; Hepatitis B; Hepatitis C Endocrine Medical History: Negative for: Type I Diabetes; Type II Diabetes Genitourinary Medical History: Negative for: End Stage Renal Disease Immunological Medical History: Negative for: Lupus Erythematosus; Raynaudos; Scleroderma Integumentary (Skin) Medical History: Negative for: History of Burn; History of pressure wounds Musculoskeletal Medical History: Negative for: Gout; Rheumatoid Arthritis; Osteoarthritis; Osteomyelitis Neurologic Medical History: Positive for: Paraplegia - waist down Negative for: Dementia; Neuropathy; Quadriplegia; Seizure Disorder Oncologic Medical History: Negative for: Received Chemotherapy; Received Radiation Past Medical History Notes: Prostate cancer- currently treated with horomone therapy Psychiatric Complaints and Symptoms: No Complaints or Symptoms Medical History: Negative for: Anorexia/bulimia; Confinement Anxiety HBO Extended History Items Eyes: Cataracts GINO, GARRABRANT. ([id number]) Immunizations Pneumococcal Vaccine: Received Pneumococcal Vaccination: No Implantable Devices No devices added Family and Social History Cancer: No; Diabetes: No; Heart Disease: No; Hypertension:  Yes - Father; Kidney Disease: No; Lung Disease: No; Seizures: No; Stroke: Yes - Mother; Thyroid Problems: No; Tuberculosis: No; Never smoker; Marital Status - Married; Alcohol Use: Never; Drug Use: No History; Caffeine Use: Daily;  Advanced Directives: Yes (Copy provided); Patient does not want information on Advanced Directives; Living Will: Yes (Copy provided) Physician Affirmation I have reviewed and agree with the above information. Electronic Signature(s) Signed: 10/05/2018 2:10:29 PM By: Worthy Keeler [name] Signed: 10/05/2018 4:48:10 PM By: Harold Barban Entered By: Worthy Keeler on 10/05/2018 09:40:56 Righter, [name] ([id number]) -------------------------------------------------------------------------------- SuperBill Details Patient Name: [name] Date of Service: 10/04/2018 Medical Record Number: [id number] Patient Account Number: [id number] Date of Birth/Sex: [birth year] (67 y.o. 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

## 2018-10-11 ENCOUNTER — Other Ambulatory Visit: Payer: Self-pay

## 2018-10-11 ENCOUNTER — Encounter: Payer: Medicare Other | Admitting: Physician Assistant

## 2018-10-11 DIAGNOSIS — E11622 Type 2 diabetes mellitus with other skin ulcer: Secondary | ICD-10-CM | POA: Diagnosis not present

## 2018-10-14 NOTE — Progress Notes (Signed)
PAPA, PIERCEFIELD ([id number]) Visit Report for 10/11/2018 Chief Complaint Document Details Patient Name: [name], [name] Date of Service: 10/11/2018 8:30 AM Medical Record Number: [id number] Patient Account Number: [id number] Date of Birth/Sex: 1950/12/16 (68 y.o. M) Treating RN: Harold Barban Primary Care Provider: [name] Other Clinician: Referring Provider: [name] Treating Provider/Extender: [name], Niki Cosman Weeks in Treatment: 74 Information Obtained from: Patient Chief Complaint Right gluteal fold ulcer Electronic Signature(s) Signed: 10/13/2018 11:01:46 PM By: Worthy Keeler [name] Entered By: Worthy Keeler on 10/11/2018 08:32:30 ADEN, SEK ([id number]) -------------------------------------------------------------------------------- HPI Details Patient Name: [name] Date of Service: 10/11/2018 8:30 AM Medical Record Number: [id number] Patient Account Number: [id number] Date of Birth/Sex: 25-Apr-1951 (68 y.o. 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 He states that there is no history of diabetes that he is aware of. He does have issues with his prostate and is currently receiving treatment for this by way of oral medication. With that being said I do not have a lot of details in that regard. Nonetheless the patient presents today as a result of having been referred to [place] by another provider initially home health was set to come out and take care of his wound although due to the fact  that he apparently drives he's not able to receive home health. His wife is therefore trying to help take care of this wound within although they have been struggling with what exactly to do at this point. She states that she can do some things but she is definitely not a nurse and does have some issues with looking at blood. The good news is the wound does not appear to be too deep and is fairly superficial at this point. There is no slough noted there is some nonviable skin noted around the surface of the wound and the perimeter at this point. The central portion of the wound appears to be very good with a dermal layer noted this does not appear to be again deep enough to extend it to subcutaneous tissue at this point. 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 In the pump he has Dilaudid and baclofen. 12/07/17 on evaluation today patient presents for follow-up concerning his ongoing lower back thigh ulcer on the right. He states that he did not get the supplies ordered and therefore has not really been able to perform the dressing changes as directed exactly. His wife was able to get some Boarder Foam Dressing's from the drugstore and subsequently has been using hydrogel which did help to a degree in the wound does appear to be able smaller. There is actually more drainage this week noted than previous. 12/21/17 on evaluation today patient appears to be doing rather well in regard to his right gluteal ulcer. He has been tolerating the dressing changes without complication. There does not appear to be any evidence of infection at this point in time. Overall the wound does seem to be making some progress as far as the edges  are concerned there's not as much in the way of overlapping of the external wound edges and he has a good epithelium to wound bed border for the most part. This however is not  true right at the 12 o'clock location over the span of a little over a centimeters which actually will require debridement today to clean this away and hopefully allow it to continue to heal more appropriately. 12/28/17 on evaluation today patient appears to be doing rather well in regard to his ulcer in the left gluteal region. He's been tolerating the dressing changes without complication. Apparently he has had some difficulty getting his dressing material. Apparently there's been some confusion with ordering we're gonna check into this. Nonetheless overall he's been showing signs of improvement which is good news. Debridement is not required today. 01/04/18 on evaluation today patient presents for follow-up concerning his right gluteal ulcer. He has been tolerating the dressing changes fairly well. On inspection today it appears he may actually have some maceration them concerned about the fact that he may be developing too much moisture in and around the wound bed which can cause delay in healing. With that being said he unfortunately really has not showed significant signs of improvement since last week's evaluation in fact this may even be just the little bit/slightly larger. Nonetheless he's been having a lot of discomfort I'm not sure this is even related to the wound as he has no pain when I'm to breeding or otherwise cleaning the wound during evaluation today. Nonetheless this is something that we did recommend he talked to his pain specialist concerning. 01/11/18 on evaluation today patient appears to be doing better in regard to his ulceration. He has been tolerating the dressing changes without complication. With that being said overall there's no evidence of infection which is good news. The only thing is he did receive the hatch affair blue classic versus the ready nonetheless I feel like this is perfectly fine and appears to have done well for him over the past week. 01/25/18 on  evaluation today patient's wound actually appears to be a little bit larger than during the last evaluation. The good [name], [name]. ([id number]) news is the majority of the wound edges actually appear to be fairly firmly attached to the wound bed unfortunately again we're not really making progress in regard to the size. Roughly the wound is about the same size as when I first saw him although again the wound margin/edges appear to be much better. 02/01/18 on evaluation today patient actually appears to be doing very well in regard to his wound. Applying the Prisma dry does seem to be better although he does still have issues with slow progression of the wound. There was a slight improvement compared to last week's measurements today. Nonetheless I have been considering other options as far as the possibility of Theraskin or even a snap vac. In general I'm not sure that the Theraskin due to location of the wound would be a very good idea. Nonetheless I do think that a snap vac could be a possibility for the patient and in fact I think this could even be an excellent way to manage the wound possibly seeing some improvement in a very rapid fashion here. Nonetheless this is something that we would need to get approved and I did have a lengthy conversation with the patient about this today. 02/08/18 on evaluation today patient appears to be doing a little better in regard to his ulcer.  He has been tolerating the dressing changes without complication. Fortunately despite the fact that the wound is a little bit smaller it's not significantly so unfortunately. We have discussed the possibility of a snap vac we did check with insurance this is actually covered at this point. Fortunately there does not appear to be any sign of infection. Overall I'm fairly pleased with how things seem to be appearing at this point. 02/15/18 on evaluation today patient appears to be doing rather well in regard to his right  gluteal ulcer. Unfortunately the snap vac did not stay in place with his sheer and friction this came loose and did not seem to maintain seal very well. He worked for about two days and it did seem to do very well during that time according to his wife but in general this does not seem to be something that's gonna be beneficial for him long-term. I do believe we need to go back to standard dressings to see if we can find something that will be of benefit. 03/02/18- He is here in follow up evaluation; there is minimal change in the wound. He will continue with the same treatment plan, would consider changing to iodosrob/iodoflex if ulcer continues to to plateau. He will follow up next week 03/08/18 on evaluation today patient's wound actually appears to be about the same size as when I previously saw him several weeks back. Unfortunately he does have some slightly dark discoloration in the central portion of the wound which has me concerned about pressure injury. I do believe he may be sitting for too long a period of time in fact he tells me that "I probably sit for much too long". He does have some Slough noted on the surface of the wound and again as far as the size of the wound is concerned I'm really not seeing anything that seems to have improved significantly. 03/15/18 on evaluation today patient appears to be doing fairly well in regard to his ulcer. The wound measured pretty much about the same today compared to last week's evaluation when looking at his graph. With that being said the area of bruising/deep tissue injury that was noted last week I do not see at this point. He did get a new cushion fortunately this does seem to be have been of benefit in my pinion. It does appear that he's been off of this more which is good news as well I think that is definitely showing in the overall wound measurements. With that being said I do believe that he needs to continue to offload I don't think that  the fact this is doing better should be or is going to allow him to not have to offload and explain this to him as well. Overall he seems to be in agreement the plan I think he understands. The overall appearance of the wound bed is improved compared to last week I think the Iodoflex has been beneficial in that regard. 03/29/18 on evaluation today patient actually appears to be doing rather well in regard to his wound from the overall appearance standpoint he does have some granulation although there's some Slough on the surface of the wound noted as well. With that being said he unfortunately has not improved in regard to the overall measurement of the wound in volume or in size. I did have a discussion with him very specifically about offloading today. He actually does work although he mainly is just sitting throughout the day. He tells me  he offloads by "lifting himself up for 30 seconds off of his chair occasionally" purchase from [employer] which does seem to have helped. And he has a new cushion that he with that being said he's also able to stand some for a very short period of time but not significant enough I think to provide appropriate offloading. I think the biggest issue at this point with the wound and the fact is not healing as quickly as we would like is due to the fact that he is really not able to appropriately offload while at work. He states the beginning after his injury he actually had a bed at his job that he could lay on in order to offload and that does seem to have been of help back at that time. Nonetheless he had not done this in quite some time unfortunately. I think that could be helpful for him this is something I would like for him to look into. 04/05/18 on evaluation today patient actually presents for follow-up concerning his right gluteal ulcer. Again he really is not significantly improved even compared to last week. He has been tolerating the dressing changes  without complication. With that being said fortunately there appears to be no evidence of infection at this time. He has been more proactive in trying to offload. [name], [name] ([id number]) 04/12/18 on evaluation today patient actually appears to be doing a little better in regard to his wound and the right gluteal fold region. He's been tolerating the dressing changes since removing the oasis without complication. However he was having a lot of burning initially with the oasis in place. He's unsure of exactly why this was given so much discomfort but he assumes that it was the oasis itself causing the problem. Nonetheless this had to be removed after about three days in place although even those three days seem to have made a fairly good improvement in regard to the overall appearance of the wound bed. In fact is the first time that he's made any improvement from the standpoint of measurements in about six weeks. He continues to have no discomfort over the area of the wound itself which leads me to wonder why he was having the burning with the oasis when he does not even feel the actual debridement's themselves. I am somewhat perplexed by this. 04/19/18 on evaluation today patient's wound actually appears to be showing signs of epithelialization around the edge of the wound and in general actually appears to be doing better which is good news. He did have the same burning after about three days with applying the Endoform last week in the same fashion that I would generally apply a skin substitute. This seems to indicate that it's not the oasis to cause the problem but potentially the moisture buildup that just causes things to burn or there may be some other reaction with the skin prep or Steri-Strips. Nonetheless I'm not sure that is gonna be able to tolerate any skin substitute for a long period of time. The good news is the wound actually appears to be doing better today compared to last  week and does seem to finally be making some progress. 04/26/18 on evaluation today patient actually appears to be doing rather well in regard to his ulcer in the right gluteal fold. He has been tolerating the dressing changes without complication which is good news. The Endoform does seem to be helping although he was a little bit more macerated this week. This  seems to be an ongoing issue with fluid control at this point. Nonetheless I think we may be able to add something like Drawtex to help control the drainage. 05/03/18 on evaluation today patient appears to actually be doing better in regard to the overall appearance of his wound. He has been tolerating the dressing changes without complication. Fortunately there appears to be no evidence of infection at this time. I really feel like his wound has shown signs as of today of turning around last week I thought so as well and definitely he could be seen in this week's overall appearance and measurements. In general I'm very pleased with the fact that he finally seems to be making a steady but sure progress. The patient likewise is very pleased. 05/17/18 on evaluation today patient appears to be doing more poorly unfortunately in regard to his ulcer. He has been tolerating the dressing changes without complication. With that being said he tells me that in the past couple of days he and his wife have noticed that we did not seem to be doing quite as well is getting dark near the center. Subsequently upon evaluation today the wound actually does appear to be doing worse compared to previous. He has been tolerating the dressing changes otherwise and he states that he is not been sitting up anymore than he was in the past from what he tells me. Still he has continued to work he states "I'm tired of dealing with this and if I have to just go home and lay in the bed all the time that's what I'll do". Nonetheless I am concerned about the fact that this  wound does appear to be deeper than what it was previous. 05/24/18 upon evaluation today patient actually presents after having been in the hospital due to what was presumed to be sepsis secondary to the wound infection. He had an elevated white blood cell count between 14 and 15. With that being said he does seem to be doing somewhat better now. His wound still is giving him some trouble nonetheless and he is obviously concerned about the fact likely talked about that this does seem to go more deeply than previously noted. I did review his wound culture which showed evidence of Staphylococcus aureus him and group B strep. Nonetheless he is on antibiotics, Levaquin, for this. Subsequently I did review his intake summary from the hospital as well. I also did look at the CT of the lumbar spine with contrast that was performed which showed no bone destruction to suggest lumbar disguises/osteomyelitis or sacral osteomyelitis. There was no paraspinal abscess. Nonetheless it appears this may have been more of just a soft tissue infection at this point which is good news. He still is nonetheless concerned about the wound which again I think is completely reasonable considering everything he's been through recently. 05/31/18 on evaluation today on evaluation today patient actually appears to be showing signs of his wound be a little bit deeper than what I would like to see. Fortunately he does not show any signs of significant infection although his temperature was 99 today he states he's been checking this at home and has not been elevated. Nonetheless with the undermining that I'm seeing at this point I am becoming more concerned about the wound I do think that offloading is a key factor here that is preventing the speedy recovery at this point. There does not appear to be any evidence of again over infection noted. He's been using  Santyl currently. 06/07/18 the patient presents today for follow-up  evaluation regarding the left ulcer in the gluteal region. He has been tolerating the Wound VAC fairly well. He is obviously very frustrated with this he states that to mean is really getting in his way. There does not appear to be any evidence of infection at this time he does have a little bit of odor I do not necessarily associate this with infection just something that we sometimes notice with Wound VAC therapy. With that being said I can definitely catch a tone of discontentment overall in the patient's demeanor today. This when he was previously in the hospital [name], [name]. ([id number]) an CT scan was done of the lumbar region which did not reveal any signs of osteomyelitis. With that being said the pelvis in particular was not evaluated distinctly which means he could still have some osteonecrosis I. Nonetheless the Wound VAC was started on Thursday I do want to get this little bit more time before jumping to a CT scan of the pelvis although that is something that I might would recommend if were not see an improvement by that time. 06/14/18 on evaluation today patient actually appears to be doing about the same in regard to his right gluteal ulcer. Again he did have a CT scan of the lumbar spine unfortunately this did not include the pelvis. Nonetheless with the depth of the wound that I'm seeing today even despite the fact that I'm not seeing any evidence of overt cellulitis I believe there's a good chance that we may be dealing with osteomyelitis somewhere in the right Ischial region. No fevers, chills, nausea, or vomiting noted at this time. 06/21/18 on evaluation today patient actually appears to be doing about the same with regard to his wound. The tunnel at 6 o'clock really does not appear to be any deeper although it is a little bit wider. I think at this point you may want to start packing this with white phone. Unfortunately I have not got approval for the CT scan of the pelvis  as of yet due to the fact that Medicare apparently has been denied it due to the diagnosis codes not being appropriate according to Medicare for the test requested. With that being said the patient cannot have an MRI and therefore this is the only option that we have as far as testing is concerned. The patient has had infection and was on antibiotics and been added code for cellulitis of the bottom to see if this will be appropriate for getting the test approved. Nonetheless I'm concerned about the infection have been spread deeper into the Ischial region. 06/28/18 on evaluation today patient actually appears to be doing rather well all things considered in regard to the right gluteal ulcer. He has been tolerating the dressing changes without complication. With that being said the Wound VAC he states does have to be replaced almost every day or at least reinforced unfortunately. Patient actually has his CT scan later this morning we should have the results by tomorrow. 07/05/18 on evaluation today patient presents for follow-up concerning his right Ischial ulcer. He did see the surgeon Dr. Lysle Pearl last week. They were actually very happy with him and felt like he spent a tremendous amount of time with them as far as discussing his situation was concerned. In the end Dr. Lysle Pearl did contact me as well and determine that he would not recommend any surgical intervention at this point as he felt like  it would not be in the patient's best interest based on what he was seeing. He recommended a referral to infectious disease. Subsequently this is something that Dr. Ines Bloomer office is working on setting up for the patient. As far as evaluation today is concerned the patient's wound actually appears to be worse at this point. I am concerned about how things are progressing and specifically about infection. I do not feel like it's the deeper but the area of depth is definitely widening which does have me concerned.  No fevers, chills, nausea, or vomiting noted at this time. I think that we do need initiate antibiotic therapy the patient has an allow allergy to amoxicillin/penicillin he states that he gets a rash since childhood. Nonetheless she's never had the issues with Catholics or cephalosporins in general but he is aware of. 07/27/18 on evaluation today patient presents following admission to the hospital on 07/09/18. He was subsequently discharged on 07/20/18. On 07/15/18 the patient underwent irrigation and debridement was soft tissue biopsy and bone biopsy as well as placement of a Wound VAC in the OR by Dr. Celine Ahr. During the hospital course the patient was placed on a Wound VAC and recommended follow up with surgery in three weeks actually with Dr. Delaine Lame who is infectious disease. The patient was on vancomycin during the hospital course. 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 He also had a bone culture which revealed evidence of methicillin-resistant staph aureus. He is updated CT scan 07/09/18 reveals that he had progression of the which was performed on wound to breakdown down to the trochanter where he actually had irregularities there as well suggestive of osteomyelitis. This was a change just since 9 December when we last performed a CT scan. Obviously this one had gone downhill quite significantly and rapidly. At this point upon evaluation I feel like in general the patient's wound seems to be doing fairly well all things considered upon my evaluation today. Obviously this is larger and deeper than what I previously evaluated but at the same time he seems to be making some progress as far as the appearance of the granulation tissue is concerned. I'm happy in that regard. No fevers, chills, nausea, or vomiting noted at this time. He is on IV vancomycin and Rocephin at the facility. He is currently in [employer]. 08/03/18 upon evaluation today  patient's wound appears to be doing better in regard to the overall appearance at this point in time. Fortunately he's been tolerating the Wound VAC without complication and states that the facility has been taking excellent care of the wound site. Overall I see some Slough noted on the surface which I am going to attempt sharp debridement today of but nonetheless other than this I feel like he's making progress. 08/09/18 on evaluation today patient's wound appears to be doing much better compared to even last week's evaluation. Do believe that the Wound VAC is been of great benefit for him. He has been tolerating the dressing changes that is the Wound [name], [name]. ([id number]) VAC without any complication and he has excellent granulation noted currently. There is no need for sharp debridement at this point. 08/16/18 on evaluation today patient actually appears to be doing very well in regard to the wound in the right gluteal fold region. This is showing signs of progress and again appears to be very healthy which is excellent news. Fortunately there is no sign of active infection by way of odor  or drainage at this point. Overall I'm very pleased with how things stand. He seems to be tolerating the Wound VAC without complication. 08/23/18 on evaluation today patient actually appears to be doing better in regard to his wound. He has been tolerating the Wound VAC without complication and in fact it has been collecting a significant amount of drainage which I think is good news especially considering how the wound appears. Fortunately there is no signs of infection at this time definitely nothing appears to be worse which is good news. He has not been started on the Bactrim and Flagyl that was recommended by Dr. Delaine Lame yet. I did actually contact her office this morning in order to check and see were things are that regard their gonna be calling me back. 08/30/18 on evaluation today patient  actually appears to show signs of excellent improvement today compared to last evaluation. The undermining is getting much better the wound seems to be feeling quite nicely and I'm very pleased that the granulation in general. With that being said overall I feel like the patient has made excellent progress which is great news. No fevers, chills, nausea, or vomiting noted at this time. 09/06/18 on evaluation today patient actually appears to be doing rather well in regard to his right gluteal ulcer. This is showing signs of improvement in overall I'm very pleased with how things seem to be progressing. The patient likewise is please. Overall I see no evidence of infection he is about to complete his oral antibiotic regimen which is the end of the antibiotics for him in just about three days. 09/13/18 on evaluation today patient's right Ischial ulcer appears to be showing signs of continued improvement which is excellent news. He's been tolerating the dressing changes without complication. Fortunately there's no signs of infection and the wound that seems to be doing very well. 09/28/18 on evaluation today patient appears to be doing rather well in regard to his right Ischial ulcer. He's been tolerating the Wound VAC without complication he knows there's much less drainage than there used to be this obviously is not a bad thing in my pinion. There's no evidence of infection despite the fact is but nothing about it now for several weeks. 10/04/18 on evaluation today patient appears to be doing better in regard to his right Ischial wound. He has been tolerating the Wound VAC without complication and I do believe that the silver nitrate last week was beneficial for him. Fortunately overall there's no evidence of active infection at this time which is great news. No fevers, chills, nausea, or vomiting noted at this time. 10/11/18 on evaluation today patient actually appears to be doing rather well in regard  to his Ischial ulcer. He's been tolerating the Wound VAC still without complication I feel like this is doing a good job. No fevers, chills, nausea, or vomiting noted at this time. Electronic Signature(s) Signed: 10/13/2018 11:01:46 PM By: Worthy Keeler [name] Entered By: Worthy Keeler on 10/11/2018 09:21:14 [name], [name] ([id number]) -------------------------------------------------------------------------------- [facility] Details Patient Name: [name] Date of Service: 10/11/2018 8:30 AM Medical Record Number: [id number] Patient Account Number: [id number] Date of Birth/Sex: Feb 10, 1951 (67 y.o. M) Treating RN: Harold Barban Primary Care Provider: [name] Other Clinician: Referring Provider: [name] Treating Provider/Extender: [name], Jaquise Faux Weeks in Treatment: 54 Procedure Performed for: Wound #1 Right Gluteal fold Performed By: Physician [name], Oswin Johal E., [name] Post Procedure Diagnosis Same as Pre-procedure Electronic Signature(s) Signed:  10/12/2018 9:04:30 AM By: Harold Barban Entered By: Harold Barban on 10/11/2018 09:12:40 [name] ([id number]) -------------------------------------------------------------------------------- Physical Exam Details Patient Name: [name] Date of Service: 10/11/2018 8:30 AM Medical Record Number: [id number] Patient Account Number: [id number] Date of Birth/Sex: [birth year] (67 y.o. M) Treating RN: Harold Barban Primary Care Provider: [name] Other Clinician: Referring Provider: [name] Treating Provider/Extender: [name], Conni Knighton Weeks in Treatment: 81 Constitutional Well-nourished and well-hydrated in no acute distress. Respiratory normal breathing without difficulty. clear to auscultation bilaterally. Cardiovascular regular rate and rhythm with normal S1, S2. Psychiatric this patient is able to make decisions and demonstrates good insight  into disease process. Alert and Oriented x 3. pleasant and cooperative. Notes Patient's wound bed currently shows evidence of good granulation at this time. Fortunately there does not appear to be any signs of active infection which is good news. Overall I feel like he still seems making progress he is less hyper granular tissue than previously noted. Electronic Signature(s) Signed: 10/13/2018 11:01:46 PM By: Worthy Keeler [name] Entered By: Worthy Keeler on 10/11/2018 09:21:39 RANA, ADORNO ([id number]) -------------------------------------------------------------------------------- Physician Orders Details Patient Name: [name] Date of Service: 10/11/2018 8:30 AM Medical Record Number: [id number] Patient Account Number: [id number] Date of Birth/Sex: 1951-05-18 (67 y.o. 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 Primary Wound Dressing Wound #1 Right Gluteal fold o Dry Gauze - wet to dry in clinic until SNF restarts NPWT Secondary Dressing Wound #1 Right Gluteal fold o Boardered Foam Dressing - in clinic until SNF restarts NPWT Dressing Change Frequency Wound #1 Right Gluteal fold o Change Dressing Monday, Wednesday, Friday - and as needed Follow-up Appointments Wound  #1 Right Gluteal fold o Return Appointment in 1 week. Off-Loading Wound #1 Right Gluteal fold o Mattress - Please continue air mattress at SNF o Turn and reposition every 2 hours Negative Pressure Wound Therapy Wound #1 Right Gluteal fold Dock, Bj [name]. ([id number]) o Wound VAC settings at 125/130 mmHg continuous pressure. Use BLACK/GREEN foam to wound cavity. Use WHITE foam to fill any tunnel/s and/or undermining. Change VAC dressing 3 X WEEK. Change canister as indicated when full. Nurse may titrate settings and frequency of dressing changes as clinically indicated. - Apply Silver Collagen to line the base of the wound. White foam to any tunnels o Home Health Nurse may d/c VAC for s/s of increased infection, significant wound regression, or uncontrolled drainage. [place] at [phone]. o Number of foam/gauze pieces used in the dressing = Electronic Signature(s) Signed: 10/12/2018 9:04:30 AM By: Harold Barban Signed: 10/13/2018 11:01:46 PM By: Worthy Keeler [name] Entered By: Harold Barban on 10/11/2018 09:16:06 [name], [name] ([id number]) -------------------------------------------------------------------------------- Problem List Details Patient Name: [name] Date of Service: 10/11/2018 8:30 AM Medical Record Number: [id number] Patient Account Number: [id number] Date of Birth/Sex: 1951/04/11 (67 y.o. 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 He states that there is no history of diabetes that he is aware of. He does have issues with his prostate and is currently receiving treatment for this by way of oral medication. With that being said I do not have a lot of details in that regard. Nonetheless the patient presents today as a result of having been referred to [place] by another provider initially home health was set to come out and take care of his wound although due to the fact that he apparently drives he's not able to receive home health. His wife is therefore trying to help take care of this wound within although they have been struggling with what exactly to do at this point. She states that she can do some things but she is definitely not a nurse and does have some issues with looking at blood. The good news is the wound  does not appear to be too deep and is fairly superficial at this point. There is no slough noted there is some nonviable skin noted around the surface of the wound and the perimeter at this point. The central portion of the wound appears to be very good with a dermal layer noted this does not appear to be again deep enough to extend it to subcutaneous tissue at this point. 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 In the pump he has Dilaudid and baclofen. 12/07/17 on evaluation today patient presents for follow-up concerning his ongoing lower back thigh ulcer on the right. He states that he did not get the supplies ordered and therefore has not really been able to perform the dressing changes as directed exactly. His wife was able to get some Boarder Foam Dressing's from the drugstore and subsequently has been using hydrogel which did help to a degree in the wound does appear to be able smaller. There is actually more drainage this week noted than previous. 12/21/17 on evaluation today patient appears to be doing rather well in regard to his right gluteal ulcer. He has been tolerating the dressing changes without complication. There does not appear to be any evidence of infection at this point in time. Overall the  wound does seem to be making some progress as far as the edges are concerned there's not as much in the way of overlapping of the external wound edges and he has a good epithelium to wound bed border for the most part. This however is not true right at the 12 o'clock location over the span of a little over a centimeters which actually will require debridement today to clean this away and hopefully allow it to continue to heal more appropriately. 12/28/17 on evaluation today patient appears to be doing rather well in regard to his ulcer in the left gluteal region. He's been tolerating the  dressing changes without complication. Apparently he has had some difficulty getting his dressing material. Apparently there's been some confusion with ordering we're gonna check into this. Nonetheless overall he's been showing signs of improvement which is good news. Debridement is not required today. 01/04/18 on evaluation today patient presents for follow-up concerning his right gluteal ulcer. He has been tolerating the dressing changes fairly well. On inspection today it appears he may actually have some maceration them concerned about the fact that he may be developing too much moisture in and around the wound bed which can cause delay in healing. With that being said he unfortunately really has not showed significant signs of improvement since last week's evaluation in fact this may even be just the little bit/slightly larger. Nonetheless he's been having a lot of discomfort I'm not sure this is even related to the wound as he has no pain when I'm to breeding or otherwise cleaning the wound during evaluation today. Nonetheless this is something that we did recommend he talked to his pain specialist concerning. 01/11/18 on evaluation today patient appears to be doing better in regard to his ulceration. He has been tolerating the dressing Amrein, Travin [name]. ([id number]) changes without complication. With that being said overall there's no evidence of infection which is good news. The only thing is he did receive the hatch affair blue classic versus the ready nonetheless I feel like this is perfectly fine and appears to have done well for him over the past week. 01/25/18 on evaluation today patient's wound actually appears to be a little bit larger than during the last evaluation. The good news is the majority of the wound edges actually appear to be fairly firmly attached to the wound bed unfortunately again we're not really making progress in regard to the size. Roughly the wound is about the same  size as when I first saw him although again the wound margin/edges appear to be much better. 02/01/18 on evaluation today patient actually appears to be doing very well in regard to his wound. Applying the Prisma dry does seem to be better although he does still have issues with slow progression of the wound. There was a slight improvement compared to last week's measurements today. Nonetheless I have been considering other options as far as the possibility of Theraskin or even a snap vac. In general I'm not sure that the Theraskin due to location of the wound would be a very good idea. Nonetheless I do think that a snap vac could be a possibility for the patient and in fact I think this could even be an excellent way to manage the wound possibly seeing some improvement in a very rapid fashion here. Nonetheless this is something that we would need to get approved and I did have a lengthy conversation with the patient about this today. 02/08/18 on evaluation today  patient appears to be doing a little better in regard to his ulcer. He has been tolerating the dressing changes without complication. Fortunately despite the fact that the wound is a little bit smaller it's not significantly so unfortunately. We have discussed the possibility of a snap vac we did check with insurance this is actually covered at this point. Fortunately there does not appear to be any sign of infection. Overall I'm fairly pleased with how things seem to be appearing at this point. 02/15/18 on evaluation today patient appears to be doing rather well in regard to his right gluteal ulcer. Unfortunately the snap vac did not stay in place with his sheer and friction this came loose and did not seem to maintain seal very well. He worked for about two days and it did seem to do very well during that time according to his wife but in general this does not seem to be something that's gonna be beneficial for him long-term. I do believe we  need to go back to standard dressings to see if we can find something that will be of benefit. 03/02/18- He is here in follow up evaluation; there is minimal change in the wound. He will continue with the same treatment plan, would consider changing to iodosrob/iodoflex if ulcer continues to to plateau. He will follow up next week 03/08/18 on evaluation today patient's wound actually appears to be about the same size as when I previously saw him several weeks back. Unfortunately he does have some slightly dark discoloration in the central portion of the wound which has me concerned about pressure injury. I do believe he may be sitting for too long a period of time in fact he tells me that "I probably sit for much too long". He does have some Slough noted on the surface of the wound and again as far as the size of the wound is concerned I'm really not seeing anything that seems to have improved significantly. 03/15/18 on evaluation today patient appears to be doing fairly well in regard to his ulcer. The wound measured pretty much about the same today compared to last week's evaluation when looking at his graph. With that being said the area of bruising/deep tissue injury that was noted last week I do not see at this point. He did get a new cushion fortunately this does seem to be have been of benefit in my pinion. It does appear that he's been off of this more which is good news as well I think that is definitely showing in the overall wound measurements. With that being said I do believe that he needs to continue to offload I don't think that the fact this is doing better should be or is going to allow him to not have to offload and explain this to him as well. Overall he seems to be in agreement the plan I think he understands. The overall appearance of the wound bed is improved compared to last week I think the Iodoflex has been beneficial in that regard. 03/29/18 on evaluation today patient actually  appears to be doing rather well in regard to his wound from the overall appearance standpoint he does have some granulation although there's some Slough on the surface of the wound noted as well. With that being said he unfortunately has not improved in regard to the overall measurement of the wound in volume or in size. I did have a discussion with him very specifically about offloading today. He actually does  work although he mainly is just sitting throughout the day. He tells me he offloads by "lifting himself up for 30 seconds off of his chair occasionally" purchase from [employer] [facility] which does seem to have helped. And he has a new cushion that he with that being said he's also able to stand some for a very short period of time but not significant enough I think to provide appropriate offloading. I think the biggest issue at this point with the wound and the fact is not healing as quickly as we would like is due to the fact that he is really not able to appropriately offload while at work. He states the beginning after his injury he actually had a bed at his job that he could lay on in order to offload and that does seem to have been of help back at that time. Nonetheless he had not done this in quite some time unfortunately. I think that could be helpful for him this is something I would like for him to look into. JEMARION, ROYCROFT ([id number]) 04/05/18 on evaluation today patient actually presents for follow-up concerning his right gluteal ulcer. Again he really is not significantly improved even compared to last week. He has been tolerating the dressing changes without complication. With that being said fortunately there appears to be no evidence of infection at this time. He has been more proactive in trying to offload. 04/12/18 on evaluation today patient actually appears to be doing a little better in regard to his wound and the right gluteal fold region. He's been tolerating the  dressing changes since removing the oasis without complication. However he was having a lot of burning initially with the oasis in place. He's unsure of exactly why this was given so much discomfort but he assumes that it was the oasis itself causing the problem. Nonetheless this had to be removed after about three days in place although even those three days seem to have made a fairly good improvement in regard to the overall appearance of the wound bed. In fact is the first time that he's made any improvement from the standpoint of measurements in about six weeks. He continues to have no discomfort over the area of the wound itself which leads me to wonder why he was having the burning with the oasis when he does not even feel the actual debridement's themselves. I am somewhat perplexed by this. 04/19/18 on evaluation today patient's wound actually appears to be showing signs of epithelialization around the edge of the wound and in general actually appears to be doing better which is good news. He did have the same burning after about three days with applying the Endoform last week in the same fashion that I would generally apply a skin substitute. This seems to indicate that it's not the oasis to cause the problem but potentially the moisture buildup that just causes things to burn or there may be some other reaction with the skin prep or Steri-Strips. Nonetheless I'm not sure that is gonna be able to tolerate any skin substitute for a long period of time. The good news is the wound actually appears to be doing better today compared to last week and does seem to finally be making some progress. 04/26/18 on evaluation today patient actually appears to be doing rather well in regard to his ulcer in the right gluteal fold. He has been tolerating the dressing changes without complication which is good news. The Endoform does seem to be  helping although he was a little bit more macerated this week. This  seems to be an ongoing issue with fluid control at this point. Nonetheless I think we may be able to add something like Drawtex to help control the drainage. 05/03/18 on evaluation today patient appears to actually be doing better in regard to the overall appearance of his wound. He has been tolerating the dressing changes without complication. Fortunately there appears to be no evidence of infection at this time. I really feel like his wound has shown signs as of today of turning around last week I thought so as well and definitely he could be seen in this week's overall appearance and measurements. In general I'm very pleased with the fact that he finally seems to be making a steady but sure progress. The patient likewise is very pleased. 05/17/18 on evaluation today patient appears to be doing more poorly unfortunately in regard to his ulcer. He has been tolerating the dressing changes without complication. With that being said he tells me that in the past couple of days he and his wife have noticed that we did not seem to be doing quite as well is getting dark near the center. Subsequently upon evaluation today the wound actually does appear to be doing worse compared to previous. He has been tolerating the dressing changes otherwise and he states that he is not been sitting up anymore than he was in the past from what he tells me. Still he has continued to work he states "I'm tired of dealing with this and if I have to just go home and lay in the bed all the time that's what I'll do". Nonetheless I am concerned about the fact that this wound does appear to be deeper than what it was previous. 05/24/18 upon evaluation today patient actually presents after having been in the hospital due to what was presumed to be sepsis secondary to the wound infection. He had an elevated white blood cell count between 14 and 15. With that being said he does seem to be doing somewhat better now. His wound still  is giving him some trouble nonetheless and he is obviously concerned about the fact likely talked about that this does seem to go more deeply than previously noted. I did review his wound culture which showed evidence of Staphylococcus aureus him and group B strep. Nonetheless he is on antibiotics, Levaquin, for this. Subsequently I did review his intake summary from the hospital as well. I also did look at the CT of the lumbar spine with contrast that was performed which showed no bone destruction to suggest lumbar disguises/osteomyelitis or sacral osteomyelitis. There was no paraspinal abscess. Nonetheless it appears this may have been more of just a soft tissue infection at this point which is good news. He still is nonetheless concerned about the wound which again I think is completely reasonable considering everything he's been through recently. 05/31/18 on evaluation today on evaluation today patient actually appears to be showing signs of his wound be a little bit deeper than what I would like to see. Fortunately he does not show any signs of significant infection although his temperature was 99 today he states he's been checking this at home and has not been elevated. Nonetheless with the undermining that I'm seeing at this point I am becoming more concerned about the wound I do think that offloading is a key factor here that is preventing the speedy recovery at this point. There does not  appear to be any evidence of again over infection noted. He's been using Santyl currently. JIRAIYA, MCEWAN ([id number]) 06/07/18 the patient presents today for follow-up evaluation regarding the left ulcer in the gluteal region. He has been tolerating the Wound VAC fairly well. He is obviously very frustrated with this he states that to mean is really getting in his way. There does not appear to be any evidence of infection at this time he does have a little bit of odor I do not necessarily associate  this with infection just something that we sometimes notice with Wound VAC therapy. With that being said I can definitely catch a tone of discontentment overall in the patient's demeanor today. This when he was previously in the hospital an CT scan was done of the lumbar region which did not reveal any signs of osteomyelitis. With that being said the pelvis in particular was not evaluated distinctly which means he could still have some osteonecrosis I. Nonetheless the Wound VAC was started on Thursday I do want to get this little bit more time before jumping to a CT scan of the pelvis although that is something that I might would recommend if were not see an improvement by that time. 06/14/18 on evaluation today patient actually appears to be doing about the same in regard to his right gluteal ulcer. Again he did have a CT scan of the lumbar spine unfortunately this did not include the pelvis. Nonetheless with the depth of the wound that I'm seeing today even despite the fact that I'm not seeing any evidence of overt cellulitis I believe there's a good chance that we may be dealing with osteomyelitis somewhere in the right Ischial region. No fevers, chills, nausea, or vomiting noted at this time. 06/21/18 on evaluation today patient actually appears to be doing about the same with regard to his wound. The tunnel at 6 o'clock really does not appear to be any deeper although it is a little bit wider. I think at this point you may want to start packing this with white phone. Unfortunately I have not got approval for the CT scan of the pelvis as of yet due to the fact that Medicare apparently has been denied it due to the diagnosis codes not being appropriate according to Medicare for the test requested. With that being said the patient cannot have an MRI and therefore this is the only option that we have as far as testing is concerned. The patient has had infection and was on antibiotics and been added  code for cellulitis of the bottom to see if this will be appropriate for getting the test approved. Nonetheless I'm concerned about the infection have been spread deeper into the Ischial region. 06/28/18 on evaluation today patient actually appears to be doing rather well all things considered in regard to the right gluteal ulcer. He has been tolerating the dressing changes without complication. With that being said the Wound VAC he states does have to be replaced almost every day or at least reinforced unfortunately. Patient actually has his CT scan later this morning we should have the results by tomorrow. 07/05/18 on evaluation today patient presents for follow-up concerning his right Ischial ulcer. He did see the surgeon Dr. Lysle Pearl last week. They were actually very happy with him and felt like he spent a tremendous amount of time with them as far as discussing his situation was concerned. In the end Dr. Lysle Pearl did contact me as well and determine that he  would not recommend any surgical intervention at this point as he felt like it would not be in the patient's best interest based on what he was seeing. He recommended a referral to infectious disease. Subsequently this is something that Dr. Ines Bloomer office is working on setting up for the patient. As far as evaluation today is concerned the patient's wound actually appears to be worse at this point. I am concerned about how things are progressing and specifically about infection. I do not feel like it's the deeper but the area of depth is definitely widening which does have me concerned. No fevers, chills, nausea, or vomiting noted at this time. I think that we do need initiate antibiotic therapy the patient has an allow allergy to amoxicillin/penicillin he states that he gets a rash since childhood. Nonetheless she's never had the issues with Catholics or cephalosporins in general but he is aware of. 07/27/18 on evaluation today patient presents  following admission to the hospital on 07/09/18. He was subsequently discharged on 07/20/18. On 07/15/18 the patient underwent irrigation and debridement was soft tissue biopsy and bone biopsy as well as placement of a Wound VAC in the OR by Dr. Celine Ahr. During the hospital course the patient was placed on a Wound VAC and recommended follow up with surgery in three weeks actually with Dr. Delaine Lame who is infectious disease. The patient was on vancomycin during the hospital course. 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 He also had a bone culture which revealed evidence of methicillin-resistant staph aureus. He is updated CT scan 07/09/18 reveals that he had progression of the which was performed on wound to breakdown down to the trochanter where he actually had irregularities there as well suggestive of osteomyelitis. This was a change just since 9 December when we last performed a CT scan. Obviously this one had gone downhill quite significantly and rapidly. At this point upon evaluation I feel like in general the patient's wound seems to be doing fairly well all things considered upon my evaluation today. Obviously this is larger and deeper than what I previously evaluated but at the same time he seems to be making some progress as far as the appearance of the granulation tissue is concerned. I'm happy in that regard. No fevers, chills, nausea, or vomiting noted at this time. He is on IV vancomycin and Rocephin at the facility. He is currently in [employer]. 08/03/18 upon evaluation today patient's wound appears to be doing better in regard to the overall appearance at this point in time. Fortunately he's been tolerating the Wound VAC without complication and states that the facility has been taking [name], [name]. ([id number]) excellent care of the wound site. Overall I see some Slough noted on the surface which I am going to attempt  sharp debridement today of but nonetheless other than this I feel like he's making progress. 08/09/18 on evaluation today patient's wound appears to be doing much better compared to even last week's evaluation. Do believe that the Wound VAC is been of great benefit for him. He has been tolerating the dressing changes that is the Wound VAC without any complication and he has excellent granulation noted currently. There is no need for sharp debridement at this point. 08/16/18 on evaluation today patient actually appears to be doing very well in regard to the wound in the right gluteal fold region. This is showing signs of progress and again appears to be very healthy which is excellent  news. Fortunately there is no sign of active infection by way of odor or drainage at this point. Overall I'm very pleased with how things stand. He seems to be tolerating the Wound VAC without complication. 08/23/18 on evaluation today patient actually appears to be doing better in regard to his wound. He has been tolerating the Wound VAC without complication and in fact it has been collecting a significant amount of drainage which I think is good news especially considering how the wound appears. Fortunately there is no signs of infection at this time definitely nothing appears to be worse which is good news. He has not been started on the Bactrim and Flagyl that was recommended by Dr. Delaine Lame yet. I did actually contact her office this morning in order to check and see were things are that regard their gonna be calling me back. 08/30/18 on evaluation today patient actually appears to show signs of excellent improvement today compared to last evaluation. The undermining is getting much better the wound seems to be feeling quite nicely and I'm very pleased that the granulation in general. With that being said overall I feel like the patient has made excellent progress which is great news. No fevers, chills, nausea, or  vomiting noted at this time. 09/06/18 on evaluation today patient actually appears to be doing rather well in regard to his right gluteal ulcer. This is showing signs of improvement in overall I'm very pleased with how things seem to be progressing. The patient likewise is please. Overall I see no evidence of infection he is about to complete his oral antibiotic regimen which is the end of the antibiotics for him in just about three days. 09/13/18 on evaluation today patient's right Ischial ulcer appears to be showing signs of continued improvement which is excellent news. He's been tolerating the dressing changes without complication. Fortunately there's no signs of infection and the wound that seems to be doing very well. 09/28/18 on evaluation today patient appears to be doing rather well in regard to his right Ischial ulcer. He's been tolerating the Wound VAC without complication he knows there's much less drainage than there used to be this obviously is not a bad thing in my pinion. There's no evidence of infection despite the fact is but nothing about it now for several weeks. 10/04/18 on evaluation today patient appears to be doing better in regard to his right Ischial wound. He has been tolerating the Wound VAC without complication and I do believe that the silver nitrate last week was beneficial for him. Fortunately overall there's no evidence of active infection at this time which is great news. No fevers, chills, nausea, or vomiting noted at this time. 10/11/18 on evaluation today patient actually appears to be doing rather well in regard to his Ischial ulcer. He's been tolerating the Wound VAC still without complication I feel like this is doing a good job. No fevers, chills, nausea, or vomiting noted at this time. Patient History Information obtained from Patient. Family History Hypertension - Father, Stroke - Mother, No family history of Cancer, Diabetes, Heart Disease, Kidney Disease,  Lung Disease, Seizures, Thyroid Problems, Tuberculosis. Social History Never smoker, Marital Status - Married, Alcohol Use - Never, Drug Use - No History, Caffeine Use - Daily. Medical History WALLIE, LAGRAND ([id number]) Eyes Patient has history of Cataracts - both removed Denies history of Glaucoma, Optic Neuritis Ear/Nose/Mouth/Throat Denies history of Chronic sinus problems/congestion, Middle ear problems Hematologic/Lymphatic Denies history of Anemia, Hemophilia, Human Immunodeficiency Virus,  Lymphedema Respiratory Denies history of Aspiration, Asthma, Chronic Obstructive Pulmonary Disease (COPD), Pneumothorax, Sleep Apnea, Tuberculosis Cardiovascular Patient has history of Hypertension - takes medication Denies history of Angina, Arrhythmia, Congestive Heart Failure, Coronary Artery Disease, Deep Vein Thrombosis, Hypotension, Myocardial Infarction, Peripheral Arterial Disease, Peripheral Venous Disease, Phlebitis, Vasculitis Gastrointestinal Denies history of Cirrhosis , Colitis, Crohn s, Hepatitis A, Hepatitis B, Hepatitis C Endocrine Denies history of Type I Diabetes, Type II Diabetes Genitourinary Denies history of End Stage Renal Disease Immunological Denies history of Lupus Erythematosus, Raynaud s, Scleroderma Integumentary (Skin) Denies history of History of Burn, History of pressure wounds Musculoskeletal Denies history of Gout, Rheumatoid Arthritis, Osteoarthritis, Osteomyelitis Neurologic Patient has history of Paraplegia - waist down Denies history of Dementia, Neuropathy, Quadriplegia, Seizure Disorder Oncologic Denies history of Received Chemotherapy, Received Radiation Psychiatric Denies history of Anorexia/bulimia, Confinement Anxiety Medical And Surgical History Notes Oncologic Prostate cancer- currently treated with horomone therapy Review of Systems (ROS) Constitutional Symptoms (General Health) Denies complaints or symptoms of Fever,  Chills. Respiratory The patient has no complaints or symptoms. Cardiovascular The patient has no complaints or symptoms. Psychiatric The patient has no complaints or symptoms. Objective Constitutional [name], [name]. ([id number]) Well-nourished and well-hydrated in no acute distress. Vitals Time Taken: 8:36 AM, Height: 73 in, Weight: 210 lbs, BMI: 27.7, Temperature: 98.4 F, Pulse: 79 bpm, Respiratory Rate: 16 breaths/min, Blood Pressure: 148/73 mmHg. Respiratory normal breathing without difficulty. clear to auscultation bilaterally. Cardiovascular regular rate and rhythm with normal S1, S2. Psychiatric this patient is able to make decisions and demonstrates good insight into disease process. Alert and Oriented x 3. pleasant and cooperative. General Notes: Patient's wound bed currently shows evidence of good granulation at this time. Fortunately there does not appear to be any signs of active infection which is good news. Overall I feel like he still seems making progress he is less hyper granular tissue than previously noted. Integumentary (Hair, Skin) Wound #1 status is Open. Original cause of wound was Pressure Injury. The wound is located on the Right Gluteal fold. The wound measures 3.2cm length x 1.8cm width x 3.6cm depth; 4.524cm^2 area and 16.286cm^3 volume. There is Fat Layer (Subcutaneous Tissue) Exposed exposed. There is no tunneling or undermining noted. There is a medium amount of serous drainage noted. The wound margin is epibole. There is large (67-100%) red, pink, hyper - granulation within the wound bed. There is a small (1-33%) amount of necrotic tissue within the wound bed including Adherent Slough. The periwound skin appearance exhibited: Rash, Maceration. The periwound skin appearance did not exhibit: Callus, Crepitus, Excoriation, Induration, Scarring, Dry/Scaly, Atrophie Blanche, Cyanosis, Ecchymosis, Hemosiderin Staining, Mottled, Pallor, Rubor, Erythema.  Periwound temperature was noted as No Abnormality. 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 An CHEM CAUT GRANULATION TISS procedure was performed by [name], Demeshia Sherburne E., [name]. Post procedure Diagnosis Wound #1: Same as Pre-Procedure [name], [name] ([id number]) Plan Wound Cleansing: Wound #1 Right Gluteal fold: Cleanse wound with mild soap and water Anesthetic (add to Medication List): Wound #1 Right Gluteal fold: Topical Lidocaine 4% cream applied to wound bed prior to debridement (In Clinic Only). Primary Wound Dressing: Wound #1 Right Gluteal fold: Dry Gauze - wet to dry in clinic until SNF restarts NPWT Secondary Dressing: Wound #1  Right Gluteal fold: Boardered Foam Dressing - in clinic until SNF restarts NPWT Dressing Change Frequency: Wound #1 Right Gluteal fold: Change Dressing Monday, Wednesday, Friday - and as needed Follow-up Appointments: Wound #1 Right Gluteal fold: Return Appointment in 1 week. Off-Loading: Wound #1 Right Gluteal fold: Mattress - Please continue air mattress at SNF Turn and reposition every 2 hours Negative Pressure Wound Therapy: Wound #1 Right Gluteal fold: Wound VAC settings at 125/130 mmHg continuous pressure. Use BLACK/GREEN foam to wound cavity. Use WHITE foam to fill any tunnel/s and/or undermining. Change VAC dressing 3 X WEEK. Change canister as indicated when full. Nurse may titrate settings and frequency of dressing changes as clinically indicated. - Apply Silver Collagen to line the base of the wound. White foam to any tunnels Home Health Nurse may d/c VAC for s/s of increased infection, significant wound regression, or uncontrolled  drainage. Flushing at [phone]. Number of foam/gauze pieces used in the dressing = My suggestion at this point is gonna be that we go ahead and continue with the above wound care measures for the next week. Patient is in agreement with plan. We will subsequently see were things stand at follow-up. Anything changes worsens meantime he will contact the office and let me know. Please see above for specific wound care orders. We will see patient for re-evaluation in 1 week(s) here in the clinic. If anything worsens or changes patient will contact our office for additional recommendations. In regard to bathing I did recommend that the patient could take a bath as long as he did so on the day that he was changing the Wound VAC. What I would recommend is leaving the Wound VAC dressing in place and then subsequently taking a shower and then went to get out of the shower removing that is to then clean the area and reapply the Wound VAC. I think this will be safe and effective way for him to get a good shower. He's gonna see about giving that a try. Electronic Signature(s) Signed: 10/13/2018 11:01:46 PM By: Worthy Keeler [name] Entered By: Worthy Keeler on 10/11/2018 09:22:35 [name], [name] ([id number]) [name], [name] ([id number]) -------------------------------------------------------------------------------- ROS/PFSH Details Patient Name: [name] Date of Service: 10/11/2018 8:30 AM Medical Record Number: [id number] Patient Account Number: [id number] Date of Birth/Sex: 1950/11/29 (67 y.o. M) Treating RN: Harold Barban Primary Care Provider: [name] Other Clinician: Referring Provider: [name] Treating Provider/Extender: [name], Luisantonio Adinolfi Weeks in Treatment: 28 Information Obtained From Patient Wound History Do you currently have one or more open woundso Yes How many open wounds do you currently haveo 1 Approximately how long have  you had your woundso 1 month How have you been treating your wound(s) until nowo cream Has your wound(s) ever healed and then re-openedo No Have you had any lab work done in the past montho No Have you tested positive for an antibiotic resistant organism (MRSA, VRE)o No Have you tested positive for osteomyelitis (bone infection)o No Have you had any tests for circulation on your legso No Constitutional Symptoms (General Health) Complaints and Symptoms: Negative for: Fever; Chills Eyes Medical History: Positive for: Cataracts - both removed Negative for: Glaucoma; Optic Neuritis Ear/Nose/Mouth/Throat Medical History: Negative for: Chronic sinus problems/congestion; Middle ear problems Hematologic/Lymphatic Medical History: Negative for: Anemia; Hemophilia; Human Immunodeficiency Virus; Lymphedema Respiratory Complaints and Symptoms: No Complaints or Symptoms Medical History: Negative for: Aspiration; Asthma; Chronic Obstructive Pulmonary Disease (COPD); Pneumothorax; Sleep Apnea; Tuberculosis Cardiovascular Complaints and Symptoms:  No Complaints or Symptoms Medical History: DUSTINE, STICKLER ([id number]) Positive for: Hypertension - takes medication Negative for: Angina; Arrhythmia; Congestive Heart Failure; Coronary Artery Disease; Deep Vein Thrombosis; Hypotension; Myocardial Infarction; Peripheral Arterial Disease; Peripheral Venous Disease; Phlebitis; Vasculitis Gastrointestinal Medical History: Negative for: Cirrhosis ; Colitis; Crohnos; Hepatitis A; Hepatitis B; Hepatitis C Endocrine Medical History: Negative for: Type I Diabetes; Type II Diabetes Genitourinary Medical History: Negative for: End Stage Renal Disease Immunological Medical History: Negative for: Lupus Erythematosus; Raynaudos; Scleroderma Integumentary (Skin) Medical History: Negative for: History of Burn; History of pressure wounds Musculoskeletal Medical History: Negative for: Gout; Rheumatoid  Arthritis; Osteoarthritis; Osteomyelitis Neurologic Medical History: Positive for: Paraplegia - waist down Negative for: Dementia; Neuropathy; Quadriplegia; Seizure Disorder Oncologic Medical History: Negative for: Received Chemotherapy; Received Radiation Past Medical History Notes: Prostate cancer- currently treated with horomone therapy Psychiatric Complaints and Symptoms: No Complaints or Symptoms Medical History: Negative for: Anorexia/bulimia; Confinement Anxiety HBO Extended History Items Eyes: Cataracts JERRED, ZAREMBA. ([id number]) Immunizations Pneumococcal Vaccine: Received Pneumococcal Vaccination: No Implantable Devices No devices added Family and Social History Cancer: No; Diabetes: No; Heart Disease: No; Hypertension: Yes - Father; Kidney Disease: No; Lung Disease: No; Seizures: No; Stroke: Yes - Mother; Thyroid Problems: No; Tuberculosis: No; Never smoker; Marital Status - Married; Alcohol Use: Never; Drug Use: No History; Caffeine Use: Daily; Advanced Directives: Yes (Copy provided); Patient does not want information on Advanced Directives; Living Will: Yes (Copy provided) Physician Affirmation I have reviewed and agree with the above information. Electronic Signature(s) Signed: 10/12/2018 9:04:30 AM By: Harold Barban Signed: 10/13/2018 11:01:46 PM By: Worthy Keeler [name] Entered By: Worthy Keeler on 10/11/2018 09:21:26 DALAN, COWGER ([id number]) -------------------------------------------------------------------------------- SuperBill Details Patient Name: [name] Date of Service: 10/11/2018 Medical Record Number: [id number] Patient Account Number: [id number] Date of Birth/Sex: [birth year] (67 y.o. 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

## 2018-10-14 NOTE — Progress Notes (Signed)
[name], [name] ([id number]) Visit Report for 10/11/2018 Arrival Information Details Patient Name: [name] Date of Service: 10/11/2018 8:30 AM Medical Record Number: [id number] Patient Account Number: [id number] Date of Birth/Sex: Dec 18, 1950 (67 y.o. M) Treating RN: Harold Barban Primary Care Corra Kaine: [name] Other Clinician: Referring Ananda Caya: [name] Treating Shanika Levings/Extender: [name], HOYT Weeks in Treatment: 65 Visit Information History Since Last Visit Added or deleted any medications: No Patient Arrived: Wheel Chair Any new allergies or adverse reactions: No Arrival Time: 08:35 Had a fall or experienced change in No Accompanied By: wife activities of daily living that may affect Transfer Assistance: None risk of falls: Patient Identification Verified: Yes Signs or symptoms of abuse/neglect since last visito No Secondary Verification Process Completed: Yes Hospitalized since last visit: No Patient Requires Transmission-Based No Implantable device outside of the clinic excluding No Precautions: cellular tissue based products placed in the center Patient Has Alerts: Yes since last visit: Patient Alerts: NOT Has Dressing in Place as Prescribed: Yes Diabetic Pain Present Now: Yes Electronic Signature(s) Signed: 10/11/2018 4:01:59 PM By: Lorine Bears RCP, RRT, CHT Entered By: Lorine Bears on 10/11/2018 08:35:55 [name], [name] ([id number]) -------------------------------------------------------------------------------- Encounter Discharge Information Details Patient Name: [name] Date of Service: 10/11/2018 8:30 AM Medical Record Number: [id number] Patient Account Number: [id number] Date of Birth/Sex: [birth year] (67 y.o. M) Treating RN: Army Melia Primary Care Joliana Claflin: [name] Other Clinician: Referring Bexton Haak: [name] Treating Kitty Cadavid/Extender: [name],  HOYT Weeks in Treatment: 94 Encounter Discharge Information Items Discharge Condition: Stable Ambulatory Status: Wheelchair Discharge Destination: Home Transportation: Private Auto Accompanied By: wife Schedule Follow-up Appointment: Yes Clinical Summary of Care: Electronic Signature(s) Signed: 10/11/2018 11:56:50 AM By: Army Melia Entered By: Army Melia on 10/11/2018 09:27:18 [name], [name] ([id number]) -------------------------------------------------------------------------------- Lower Extremity Assessment Details Patient Name: [name] Date of Service: 10/11/2018 8:30 AM Medical Record Number: [id number] Patient Account Number: [id number] Date of Birth/Sex: 09-03-1950 (67 y.o. M) Treating RN: Montey Hora Primary Care Floree Zuniga: [name] Other Clinician: Referring Jaxson Keener: [name] Treating Veatrice Eckstein/Extender: [name], HOYT Weeks in Treatment: 45 Electronic Signature(s) Signed: 10/11/2018 3:31:49 PM By: Montey Hora Entered By: Montey Hora on 10/11/2018 08:45:23 [name], [name] ([id number]) -------------------------------------------------------------------------------- Multi Wound Chart Details Patient Name: [name] Date of Service: 10/11/2018 8:30 AM Medical Record Number: [id number] Patient Account Number: [id number] Date of Birth/Sex: 02/07/1951 (67 y.o. M) Treating RN: Harold Barban Primary Care Raphaela Cannaday: [name] Other Clinician: Referring Elveta Rape: [name] Treating Oliviah Agostini/Extender: [name], HOYT Weeks in Treatment: 45 Vital Signs Height(in): 73 Pulse(bpm): 79 Weight(lbs): 210 Blood Pressure(mmHg): 148/73 Body Mass Index(BMI): 28 Temperature(F): 98.4 Respiratory Rate 16 (breaths/min): Photos: [1:No Photos] [N/A:N/A] Wound Location: [1:Right Gluteal fold] [N/A:N/A] Wounding Event: [1:Pressure Injury] [N/A:N/A] Primary Etiology: [1:Pressure Ulcer] [N/A:N/A] Comorbid History:  [1:Cataracts, Hypertension, Paraplegia] [N/A:N/A] Date Acquired: [1:11/02/2017] [N/A:N/A] Weeks of Treatment: [1:45] [N/A:N/A] Wound Status: [1:Open] [N/A:N/A] Measurements L x W x D [1:3.2x1.8x3.6] [N/A:N/A] (cm) Area (cm) : [1:4.524] [N/A:N/A] Volume (cm) : [1:16.286] [N/A:N/A] % Reduction in Area: [1:55.00%] [N/A:N/A] % Reduction in Volume: [1:-1520.50%] [N/A:N/A] Classification: [1:Category/Stage IV] [N/A:N/A] Exudate Amount: [1:Medium] [N/A:N/A] Exudate Type: [1:Serous] [N/A:N/A] Exudate Color: [1:amber] [N/A:N/A] Wound Margin: [1:Epibole] [N/A:N/A] Granulation Amount: [1:Large (67-100%)] [N/A:N/A] Granulation Quality: [1:Red, Pink, Hyper-granulation] [N/A:N/A] Necrotic Amount: [1:Small (1-33%)] [N/A:N/A] Exposed Structures: [1:Fat Layer (Subcutaneous Tissue) Exposed: Yes Fascia: No Tendon: No Muscle: No Joint: No Bone: No] [N/A:N/A] Epithelialization: [1:Small (1-33%)] [N/A:N/A] Periwound Skin Texture: [1:Rash: Yes Excoriation: No Induration: No Callus: No Crepitus: No Scarring:  No] [N/A:N/A] Periwound Skin Moisture: Maceration: Yes N/A N/A Dry/Scaly: No Periwound Skin Color: Atrophie Blanche: No N/A N/A Cyanosis: No Ecchymosis: No Erythema: No Hemosiderin Staining: No Mottled: No Pallor: No Rubor: No Temperature: No Abnormality N/A N/A Tenderness on Palpation: No N/A N/A Wound Preparation: Ulcer Cleansing: N/A N/A Rinsed/Irrigated with Saline Topical Anesthetic Applied: Other: lidocaine 4% Treatment Notes Electronic Signature(s) Signed: 10/12/2018 9:04:30 AM By: Harold Barban Entered By: Harold Barban on 10/11/2018 09:09:56 [name], [name] ([id number]) -------------------------------------------------------------------------------- Foot of Ten Details Patient Name: [name] Date of Service: 10/11/2018 8:30 AM Medical Record Number: [id number] Patient Account Number: [id number] Date of Birth/Sex: 01-30-1951 (67 y.o.  M) Treating RN: Harold Barban Primary Care Analyce Tavares: [name] Other Clinician: Referring Desteny Freeman: [name] Treating Gelene Recktenwald/Extender: [name], HOYT Weeks in Treatment: 80 Active Inactive Orientation to the Wound Care Program Nursing Diagnoses: Knowledge deficit related to the wound healing center program Goals: Patient/caregiver will verbalize understanding of the [facility] Date Initiated: 11/30/2017 Target Resolution Date: 12/21/2017 Goal Status: Active Interventions: Provide education on orientation to the wound center Notes: Pressure Nursing Diagnoses: Knowledge deficit related to management of pressures ulcers Goals: Patient/caregiver will verbalize understanding of pressure ulcer management Date Initiated: 05/17/2018 Target Resolution Date: 05/28/2018 Goal Status: Active Interventions: Assess: immobility, friction, shearing, incontinence upon admission and as needed Notes: Wound/Skin Impairment Nursing Diagnoses: Impaired tissue integrity Goals: Patient/caregiver will verbalize understanding of skin care regimen Date Initiated: 11/30/2017 Target Resolution Date: 12/21/2017 Goal Status: Active Ulcer/skin breakdown will have a volume reduction of 30% by week 4 Date Initiated: 11/30/2017 Target Resolution Date: 12/21/2017 Goal Status: Active [name], [name] ([id number]) Interventions: Assess patient/caregiver ability to obtain necessary supplies Assess patient/caregiver ability to perform ulcer/skin care regimen upon admission and as needed Assess ulceration(s) every visit Treatment Activities: Skin care regimen initiated : 11/30/2017 Notes: Electronic Signature(s) Signed: 10/12/2018 9:04:30 AM By: Harold Barban Entered By: Harold Barban on 10/11/2018 09:09:48 [name] ([id number]) -------------------------------------------------------------------------------- Pain Assessment Details Patient Name: [name] Date of Service: 10/11/2018 8:30 AM Medical Record Number: [id number] Patient Account Number: [id number] Date of Birth/Sex: 1950/09/16 (67 y.o. M) Treating RN: Harold Barban Primary Care Kalisa Girtman: [name] Other Clinician: Referring Ayelen Sciortino: [name] Treating Maddison Kilner/Extender: [name], HOYT Weeks in Treatment: 45 Active Problems Location of Pain Severity and Description of Pain Patient Has Paino Yes Site Locations Rate the pain. Current Pain Level: 5 Pain Management and Medication Current Pain Management: Electronic Signature(s) Signed: 10/11/2018 4:01:59 PM By: Lorine Bears RCP, RRT, CHT Signed: 10/12/2018 9:04:30 AM By: Harold Barban Entered By: Lorine Bears on 10/11/2018 08:36:23 [name], [name] ([id number]) -------------------------------------------------------------------------------- Patient/Caregiver Education Details Patient Name: [name] Date of Service: 10/11/2018 8:30 AM Medical Record Number: [id number] Patient Account Number: [id number] Date of Birth/Gender: 10/15/1950 (67 y.o. M) Treating RN: Harold Barban Primary Care Physician: [name] Other Clinician: Referring Physician: [name] Treating Physician/Extender: Sharalyn Ink in Treatment: 6 Education Assessment Education Provided To: Patient Education Topics Provided Wound/Skin Impairment: Handouts: Caring for Your Ulcer Methods: Demonstration, Explain/Verbal Responses: State content correctly Electronic Signature(s) Signed: 10/12/2018 9:04:30 AM By: Harold Barban Entered By: Harold Barban on 10/11/2018 09:10:15 [name] ([id number]) -------------------------------------------------------------------------------- Wound Assessment Details Patient Name: [name] Date of Service: 10/11/2018 8:30 AM Medical Record Number: [id number] Patient Account Number: [id number] Date of  Birth/Sex: 19-Apr-1951 (67 y.o. M) Treating RN: Montey Hora Primary Care Johne Buckle: [name] Other Clinician: Referring Meila Berke: [name] Treating Alayasia Breeding/Extender: [name],  HOYT Weeks in Treatment: 45 Wound Status Wound Number: 1 Primary Etiology: Pressure Ulcer Wound Location: Right Gluteal fold Wound Status: Open Wounding Event: Pressure Injury Comorbid History: Cataracts, Hypertension, Paraplegia Date Acquired: 11/02/2017 Weeks Of Treatment: 45 Clustered Wound: No Photos Photo Uploaded By: Montey Hora on 10/11/2018 09:31:12 Wound Measurements Length: (cm) 3.2 Width: (cm) 1.8 Depth: (cm) 3.6 Area: (cm) 4.524 Volume: (cm) 16.286 % Reduction in Area: 55% % Reduction in Volume: -1520.5% Epithelialization: Small (1-33%) Tunneling: No Undermining: No Wound Description Classification: Category/Stage IV Wound Margin: Epibole Exudate Amount: Medium Exudate Type: Serous Exudate Color: amber Foul Odor After Cleansing: No Slough/Fibrino Yes Wound Bed Granulation Amount: Large (67-100%) Exposed Structure Granulation Quality: Red, Pink, Hyper-granulation Fascia Exposed: No Necrotic Amount: Small (1-33%) Fat Layer (Subcutaneous Tissue) Exposed: Yes Necrotic Quality: Adherent Slough Tendon Exposed: No Muscle Exposed: No Joint Exposed: No Bone Exposed: No Periwound Skin Texture [name], [name] ([id number]) Texture Color No Abnormalities Noted: No No Abnormalities Noted: No Callus: No Atrophie Blanche: No Crepitus: No Cyanosis: No Excoriation: No Ecchymosis: No Induration: No Erythema: No Rash: Yes Hemosiderin Staining: No Scarring: No Mottled: No Pallor: No Moisture Rubor: No No Abnormalities Noted: No Dry / Scaly: No Temperature / Pain Maceration: Yes Temperature: No Abnormality Wound Preparation Ulcer Cleansing: Rinsed/Irrigated with Saline Topical Anesthetic Applied: Other: lidocaine 4%, Treatment Notes Wound #1 (Right  Gluteal fold) Notes dakins wet to dry, bordered foam dressing; SNF nurses to reapply NPWT Electronic Signature(s) Signed: 10/11/2018 3:31:49 PM By: Montey Hora Entered By: Montey Hora on 10/11/2018 08:51:34 [name], [name] ([id number]) -------------------------------------------------------------------------------- Industry Details Patient Name: [name] Date of Service: 10/11/2018 8:30 AM Medical Record Number: [id number] Patient Account Number: [id number] Date of Birth/Sex: [birth year] (67 y.o. M) Treating RN: Harold Barban Primary Care Baily Serpe: [name] Other Clinician: Referring Ethelreda Sukhu: [name] Treating Cambryn Charters/Extender: [name], HOYT Weeks in Treatment: 45 Vital Signs Time Taken: 08:36 Temperature (F): 98.4 Height (in): 73 Pulse (bpm): 79 Weight (lbs): 210 Respiratory Rate (breaths/min): 16 Body Mass Index (BMI): 27.7 Blood Pressure (mmHg): 148/73 Reference Range: 80 - 120 mg / dl Electronic Signature(s) Signed: 10/11/2018 4:01:59 PM By: Lorine Bears RCP, RRT, CHT Entered By: Lorine Bears on 10/11/2018 08:37:17

## 2018-10-18 ENCOUNTER — Ambulatory Visit: Payer: Medicare Other | Admitting: Physician Assistant

## 2018-10-25 ENCOUNTER — Ambulatory Visit: Payer: Medicare Other | Admitting: Physician Assistant

## 2018-11-01 ENCOUNTER — Other Ambulatory Visit: Payer: Self-pay

## 2018-11-01 ENCOUNTER — Encounter: Payer: Medicare Other | Attending: Physician Assistant | Admitting: Physician Assistant

## 2018-11-01 DIAGNOSIS — G8220 Paraplegia, unspecified: Secondary | ICD-10-CM | POA: Diagnosis not present

## 2018-11-01 DIAGNOSIS — Z880 Allergy status to penicillin: Secondary | ICD-10-CM | POA: Diagnosis not present

## 2018-11-01 DIAGNOSIS — L03317 Cellulitis of buttock: Secondary | ICD-10-CM | POA: Insufficient documentation

## 2018-11-01 DIAGNOSIS — Z8249 Family history of ischemic heart disease and other diseases of the circulatory system: Secondary | ICD-10-CM | POA: Diagnosis not present

## 2018-11-01 DIAGNOSIS — C61 Malignant neoplasm of prostate: Secondary | ICD-10-CM | POA: Diagnosis not present

## 2018-11-01 DIAGNOSIS — L89314 Pressure ulcer of right buttock, stage 4: Secondary | ICD-10-CM | POA: Diagnosis present

## 2018-11-02 NOTE — Progress Notes (Signed)
Brett, [name] ([id number]) Visit Report for 11/01/2018 Arrival Information Details Patient Name: [name], [name] Date of Service: 11/01/2018 8:30 AM Medical Record Number: [id number] Patient Account Number: [id number] Date of Birth/Sex: [birth year] (68 y.o. M) Treating RN: Harold Barban Primary Care Elmyra Banwart: [name] Other Clinician: Referring Resha Filippone: [name] Treating Bobbi Yount/Extender: [name], HOYT Weeks in Treatment: 73 Visit Information History Since Last Visit Added or deleted any medications: No Patient Arrived: Wheel Chair Any new allergies or adverse reactions: No Arrival Time: 08:41 Had a fall or experienced change in No Accompanied By: self activities of daily living that may affect Transfer Assistance: None risk of falls: Patient Identification Verified: Yes Signs or symptoms of abuse/neglect since last visito No Secondary Verification Process Completed: Yes Hospitalized since last visit: No Patient Requires Transmission-Based No Implantable device outside of the clinic excluding No Precautions: cellular tissue based products placed in the center Patient Has Alerts: Yes since last visit: Patient Alerts: NOT Has Dressing in Place as Prescribed: Yes Diabetic Pain Present Now: Yes Electronic Signature(s) Signed: 11/01/2018 12:11:00 PM By: Lorine Bears RCP, RRT, CHT Entered By: Lorine Bears on 11/01/2018 08:42:32 DEVELL, PARKERSON ([id number]) -------------------------------------------------------------------------------- Clinic Level of Care Assessment Details Patient Name: [name] Date of Service: 11/01/2018 8:30 AM Medical Record Number: [id number] Patient Account Number: [id number] Date of Birth/Sex: 1950-10-18 (68 y.o. M) Treating RN: Harold Barban Primary Care Latausha Flamm: [name] Other Clinician: Referring Shelby Peltz: [name] Treating Melenie Minniear/Extender: [name],  HOYT Weeks in Treatment: 77 Clinic Level of Care Assessment Items TOOL 4 Quantity Score []  - Use when only an EandM is performed on FOLLOW-UP visit 0 ASSESSMENTS - Nursing Assessment / Reassessment X - Reassessment of Co-morbidities (includes updates in patient status) 1 10 X- 1 5 Reassessment of Adherence to Treatment Plan ASSESSMENTS - Wound and Skin Assessment / Reassessment X - Simple Wound Assessment / Reassessment - one wound 1 5 []  - 0 Complex Wound Assessment / Reassessment - multiple wounds []  - 0 Dermatologic / Skin Assessment (not related to wound area) ASSESSMENTS - Focused Assessment []  - Circumferential Edema Measurements - multi extremities 0 []  - 0 Nutritional Assessment / Counseling / Intervention []  - 0 Lower Extremity Assessment (monofilament, tuning fork, pulses) []  - 0 Peripheral Arterial Disease Assessment (using hand held doppler) ASSESSMENTS - Ostomy and/or Continence Assessment and Care []  - Incontinence Assessment and Management 0 []  - 0 Ostomy Care Assessment and Management (repouching, etc.) PROCESS - Coordination of Care X - Simple Patient / Family Education for ongoing care 1 15 []  - 0 Complex (extensive) Patient / Family Education for ongoing care X- 1 10 Staff obtains Programmer, systems, Records, Test Results / Process Orders []  - 0 Staff telephones HHA, Nursing Homes / Clarify orders / etc []  - 0 Routine Transfer to another Facility (non-emergent condition) []  - 0 Routine Hospital Admission (non-emergent condition) []  - 0 New Admissions / Biomedical engineer / Ordering NPWT, Apligraf, etc. []  - 0 Emergency Hospital Admission (emergent condition) X- 1 10 Simple Discharge Coordination KHYLAN, SAWYER. ([id number]) []  - 0 Complex (extensive) Discharge Coordination PROCESS - Special Needs []  - Pediatric / Minor Patient Management 0 []  - 0 Isolation Patient Management []  - 0 Hearing / Language / Visual special needs []  - 0 Assessment of  Community assistance (transportation, D/C planning, etc.) []  - 0 Additional assistance / Altered mentation []  - 0 Support Surface(s) Assessment (bed, cushion, seat, etc.) INTERVENTIONS - Wound Cleansing / Measurement X - Simple Wound Cleansing -  one wound 1 5 []  - 0 Complex Wound Cleansing - multiple wounds X- 1 5 Wound Imaging (photographs - any number of wounds) []  - 0 Wound Tracing (instead of photographs) X- 1 5 Simple Wound Measurement - one wound []  - 0 Complex Wound Measurement - multiple wounds INTERVENTIONS - Wound Dressings X - Small Wound Dressing one or multiple wounds 1 10 []  - 0 Medium Wound Dressing one or multiple wounds []  - 0 Large Wound Dressing one or multiple wounds []  - 0 Application of Medications - topical []  - 0 Application of Medications - injection INTERVENTIONS - Miscellaneous []  - External ear exam 0 []  - 0 Specimen Collection (cultures, biopsies, blood, body fluids, etc.) []  - 0 Specimen(s) / Culture(s) sent or taken to Lab for analysis []  - 0 Patient Transfer (multiple staff / Civil Service fast streamer / Similar devices) []  - 0 Simple Staple / Suture removal (25 or less) []  - 0 Complex Staple / Suture removal (26 or more) []  - 0 Hypo / Hyperglycemic Management (close monitor of Blood Glucose) []  - 0 Ankle / Brachial Index (ABI) - do not check if billed separately X- 1 5 Vital Signs Stannard, Kyndal J. ([id number]) Has the patient been seen at the hospital within the last three years: Yes Total Score: 85 Level Of Care: New/Established - Level 3 Electronic Signature(s) Signed: 11/01/2018 2:00:35 PM By: Harold Barban Entered By: Harold Barban on 11/01/2018 09:06:29 [name] ([id number]) -------------------------------------------------------------------------------- Lower Extremity Assessment Details Patient Name: [name] Date of Service: 11/01/2018 8:30 AM Medical Record Number: [id number] Patient Account Number:  [id number] Date of Birth/Sex: 1951-02-12 (68 y.o. M) Treating RN: Montey Hora Primary Care Nellie Pester: [name] Other Clinician: Referring Deiontae Rabel: [name] Treating Dagmar Adcox/Extender: [name], HOYT Weeks in Treatment: 67 Electronic Signature(s) Signed: 11/01/2018 12:20:50 PM By: Montey Hora Entered By: Montey Hora on 11/01/2018 08:48:17 LAM, MCCUBBINS ([id number]) -------------------------------------------------------------------------------- Multi Wound Chart Details Patient Name: [name] Date of Service: 11/01/2018 8:30 AM Medical Record Number: [id number] Patient Account Number: [id number] Date of Birth/Sex: 08-27-1950 (68 y.o. M) Treating RN: Harold Barban Primary Care Shayann Garbutt: [name] Other Clinician: Referring Nakoma Gotwalt: [name] Treating Ayven Pheasant/Extender: [name], HOYT Weeks in Treatment: 48 Vital Signs Height(in): 73 Pulse(bpm): 81 Weight(lbs): 210 Blood Pressure(mmHg): 133/77 Body Mass Index(BMI): 28 Temperature(F): 98.6 Respiratory Rate 16 (breaths/min): Photos: [N/A:N/A] Wound Location: Right Gluteal fold N/A N/A Wounding Event: Pressure Injury N/A N/A Primary Etiology: Pressure Ulcer N/A N/A Comorbid History: Cataracts, Hypertension, N/A N/A Paraplegia Date Acquired: 11/02/2017 N/A N/A Weeks of Treatment: 48 N/A N/A Wound Status: Open N/A N/A Measurements L x W x D 3.2x2.4x3.8 N/A N/A (cm) Area (cm) : 6.032 N/A N/A Volume (cm) : 22.921 N/A N/A % Reduction in Area: 40.00% N/A N/A % Reduction in Volume: -2180.70% N/A N/A Classification: Category/Stage IV N/A N/A Exudate Amount: Medium N/A N/A Exudate Type: Serous N/A N/A Exudate Color: amber N/A N/A Wound Margin: Epibole N/A N/A Granulation Amount: Large (67-100%) N/A N/A Granulation Quality: Red, Pink, Hyper-granulation N/A N/A Necrotic Amount: Small (1-33%) N/A N/A Exposed Structures: Fat Layer (Subcutaneous N/A N/A Tissue)  Exposed: Yes Fascia: No Tendon: No Muscle: No Joint: No Bone: No Harris, [name] J. ([id number]) Epithelialization: None N/A N/A Periwound Skin Texture: Rash: Yes N/A N/A Excoriation: No Induration: No Callus: No Crepitus: No Scarring: No Periwound Skin Moisture: Maceration: Yes N/A N/A Dry/Scaly: No Periwound Skin Color: Atrophie Blanche: No N/A N/A Cyanosis: No Ecchymosis: No Erythema: No Hemosiderin Staining: No Mottled: No Pallor:  No Rubor: No Temperature: No Abnormality N/A N/A Tenderness on Palpation: Yes N/A N/A Treatment Notes Electronic Signature(s) Signed: 11/01/2018 2:00:35 PM By: Harold Barban Entered By: Harold Barban on 11/01/2018 09:00:41 AMADO, ANDAL ([id number]) -------------------------------------------------------------------------------- [place] Details Patient Name: [name] Date of Service: 11/01/2018 8:30 AM Medical Record Number: [id number] Patient Account Number: [id number] Date of Birth/Sex: 07-28-1950 (68 y.o. M) Treating RN: Harold Barban Primary Care Dawanda Mapel: [name] Other Clinician: Referring Tamyka Bezio: [name] Treating Shakinah Navis/Extender: [name], HOYT Weeks in Treatment: 69 Active Inactive Orientation to the Wound Care Program Nursing Diagnoses: Knowledge deficit related to the wound healing center program Goals: Patient/caregiver will verbalize understanding of the [name] Date Initiated: 11/30/2017 Target Resolution Date: 12/21/2017 Goal Status: Active Interventions: Provide education on orientation to the wound center Notes: Pressure Nursing Diagnoses: Knowledge deficit related to management of pressures ulcers Goals: Patient/caregiver will verbalize understanding of pressure ulcer management Date Initiated: 05/17/2018 Target Resolution Date: 05/28/2018 Goal Status: Active Interventions: Assess: immobility, friction, shearing,  incontinence upon admission and as needed Notes: Wound/Skin Impairment Nursing Diagnoses: Impaired tissue integrity Goals: Patient/caregiver will verbalize understanding of skin care regimen Date Initiated: 11/30/2017 Target Resolution Date: 12/21/2017 Goal Status: Active Ulcer/skin breakdown will have a volume reduction of 30% by week 4 Date Initiated: 11/30/2017 Target Resolution Date: 12/21/2017 Goal Status: Active KRYSTOFER, HEVENER ([id number]) Interventions: Assess patient/caregiver ability to obtain necessary supplies Assess patient/caregiver ability to perform ulcer/skin care regimen upon admission and as needed Assess ulceration(s) every visit Treatment Activities: Skin care regimen initiated : 11/30/2017 Notes: Electronic Signature(s) Signed: 11/01/2018 2:00:35 PM By: Harold Barban Entered By: Harold Barban on 11/01/2018 09:00:32 [name] ([id number]) -------------------------------------------------------------------------------- Pain Assessment Details Patient Name: [name] Date of Service: 11/01/2018 8:30 AM Medical Record Number: [id number] Patient Account Number: [id number] Date of Birth/Sex: 1951-01-01 (68 y.o. M) Treating RN: Harold Barban Primary Care Quamere Mussell: [name] Other Clinician: Referring Gleen Ripberger: [name] Treating Isella Slatten/Extender: [name], HOYT Weeks in Treatment: 48 Active Problems Location of Pain Severity and Description of Pain Patient Has Paino Yes Site Locations Pain Management and Medication Current Pain Management: Electronic Signature(s) Signed: 11/01/2018 12:11:00 PM By: Lorine Bears RCP, RRT, CHT Signed: 11/01/2018 2:00:35 PM By: Harold Barban Entered By: Lorine Bears on 11/01/2018 08:42:41 CHAMPION, CORALES ([id number]) -------------------------------------------------------------------------------- Patient/Caregiver Education Details Patient Name:  [name] Date of Service: 11/01/2018 8:30 AM Medical Record Number: [id number] Patient Account Number: [id number] Date of Birth/Gender: 1950-09-08 (68 y.o. M) Treating RN: Harold Barban Primary Care Physician: [name] Other Clinician: Referring Physician: [name] Treating Physician/Extender: Sharalyn Ink in Treatment: 42 Education Assessment Education Provided To: Patient Education Topics Provided Pressure: Handouts: Pressure Ulcers: Care and Offloading Methods: Demonstration, Explain/Verbal Responses: State content correctly Wound/Skin Impairment: Handouts: Caring for Your Ulcer Methods: Demonstration, Explain/Verbal Responses: State content correctly Electronic Signature(s) Signed: 11/01/2018 2:00:35 PM By: Harold Barban Entered By: Harold Barban on 11/01/2018 09:01:31 [name] ([id number]) -------------------------------------------------------------------------------- Wound Assessment Details Patient Name: [name] Date of Service: 11/01/2018 8:30 AM Medical Record Number: [id number] Patient Account Number: [id number] Date of Birth/Sex: 01-01-1951 (68 y.o. M) Treating RN: Montey Hora Primary Care Isaih Bulger: [name] Other Clinician: Referring Jara Feider: [name] Treating Maribell Demeo/Extender: [name], HOYT Weeks in Treatment: 48 Wound Status Wound Number: 1 Primary Etiology: Pressure Ulcer Wound Location: Right Gluteal fold Wound Status: Open Wounding Event: Pressure Injury Comorbid History: Cataracts, Hypertension, Paraplegia Date Acquired: 11/02/2017 Weeks Of Treatment: 48 Clustered Wound:  No Photos Wound Measurements Length: (cm) 3.2 Width: (cm) 2.4 Depth: (cm) 3.8 Area: (cm) 6.032 Volume: (cm) 22.921 % Reduction in Area: 40% % Reduction in Volume: -2180.7% Epithelialization: None Tunneling: No Undermining: No Wound Description Classification: Category/Stage IV Foul  Odo Wound Margin: Epibole Slough/F Exudate Amount: Medium Exudate Type: Serous Exudate Color: amber r After Cleansing: No ibrino Yes Wound Bed Granulation Amount: Large (67-100%) Exposed Structure Granulation Quality: Red, Pink, Hyper-granulation Fascia Exposed: No Necrotic Amount: Small (1-33%) Fat Layer (Subcutaneous Tissue) Exposed: Yes Necrotic Quality: Adherent Slough Tendon Exposed: No Muscle Exposed: No Joint Exposed: No Bone Exposed: No Periwound Skin Texture Texture Color Grow, Duquan J. ([id number]) No Abnormalities Noted: No No Abnormalities Noted: No Callus: No Atrophie Blanche: No Crepitus: No Cyanosis: No Excoriation: No Ecchymosis: No Induration: No Erythema: No Rash: Yes Hemosiderin Staining: No Scarring: No Mottled: No Pallor: No Moisture Rubor: No No Abnormalities Noted: No Dry / Scaly: No Temperature / Pain Maceration: Yes Temperature: No Abnormality Tenderness on Palpation: Yes Electronic Signature(s) Signed: 11/01/2018 12:20:50 PM By: Montey Hora Entered By: Montey Hora on 11/01/2018 08:56:27 [name] ([id number]) -------------------------------------------------------------------------------- Vitals Details Patient Name: [name] Date of Service: 11/01/2018 8:30 AM Medical Record Number: [id number] Patient Account Number: [id number] Date of Birth/Sex: 1950-09-24 (68 y.o. M) Treating RN: Harold Barban Primary Care An Schnabel: [name] Other Clinician: Referring Hargis Vandyne: [name] Treating Ellen Goris/Extender: [name], HOYT Weeks in Treatment: 48 Vital Signs Time Taken: 08:42 Temperature (F): 98.6 Height (in): 73 Pulse (bpm): 81 Weight (lbs): 210 Respiratory Rate (breaths/min): 16 Body Mass Index (BMI): 27.7 Blood Pressure (mmHg): 133/77 Reference Range: 80 - 120 mg / dl Electronic Signature(s) Signed: 11/01/2018 12:11:00 PM By: Lorine Bears RCP, RRT, CHT Entered  By: Lorine Bears on 11/01/2018 08:43:33

## 2018-11-03 NOTE — Progress Notes (Signed)
SEBASTIANO, LUECKE ([id number]) Visit Report for 11/01/2018 Chief Complaint Document Details Patient Name: [name], [name] Date of Service: 11/01/2018 8:30 AM Medical Record Number: [id number] Patient Account Number: [id number] Date of Birth/Sex: Jun 07, 1951 (68 y.o. M) Treating RN: Harold Barban Primary Care Provider: [name] Other Clinician: Referring Provider: [name] Treating Provider/Extender: [name], Braelin Costlow Weeks in Treatment: 66 Information Obtained from: Patient Chief Complaint Right gluteal fold ulcer Electronic Signature(s) Signed: 11/01/2018 11:22:15 PM By: Worthy Keeler [name] Entered By: Worthy Keeler on 11/01/2018 08:47:59 LEDGER, HEINDL ([id number]) -------------------------------------------------------------------------------- HPI Details Patient Name: [name] Date of Service: 11/01/2018 8:30 AM Medical Record Number: [id number] Patient Account Number: [id number] Date of Birth/Sex: [birth year] (68 y.o. 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 He states that there is no history of diabetes that he is aware of. He does have issues with his prostate and is currently receiving treatment for this by way of oral medication. With that being said I do not have a lot of details in that regard. Nonetheless the patient presents today as a result of having been referred to [place] by another provider initially home health was set to come out and take care of his wound although due to the fact  that he apparently drives he's not able to receive home health. His wife is therefore trying to help take care of this wound within although they have been struggling with what exactly to do at this point. She states that she can do some things but she is definitely not a nurse and does have some issues with looking at blood. The good news is the wound does not appear to be too deep and is fairly superficial at this point. There is no slough noted there is some nonviable skin noted around the surface of the wound and the perimeter at this point. The central portion of the wound appears to be very good with a dermal layer noted this does not appear to be again deep enough to extend it to subcutaneous tissue at this point. 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 In the pump he has Dilaudid and baclofen. 12/07/17 on evaluation today patient presents for follow-up concerning his ongoing lower back thigh ulcer on the right. He states that he did not get the supplies ordered and therefore has not really been able to perform the dressing changes as directed exactly. His wife was able to get some Boarder Foam Dressing's from the drugstore and subsequently has been using hydrogel which did help to a degree in the wound does appear to be able smaller. There is actually more drainage this week noted than previous. 12/21/17 on evaluation today patient appears to be doing rather well in regard to his right gluteal ulcer. He has been tolerating the dressing changes without complication. There does not appear to be any evidence of infection at this point in time. Overall the wound does seem to be making some progress as far as the edges  are concerned there's not as much in the way of overlapping of the external wound edges and he has a good epithelium to wound bed border for the most part. This however is not  true right at the 12 o'clock location over the span of a little over a centimeters which actually will require debridement today to clean this away and hopefully allow it to continue to heal more appropriately. 12/28/17 on evaluation today patient appears to be doing rather well in regard to his ulcer in the left gluteal region. He's been tolerating the dressing changes without complication. Apparently he has had some difficulty getting his dressing material. Apparently there's been some confusion with ordering we're gonna check into this. Nonetheless overall he's been showing signs of improvement which is good news. Debridement is not required today. 01/04/18 on evaluation today patient presents for follow-up concerning his right gluteal ulcer. He has been tolerating the dressing changes fairly well. On inspection today it appears he may actually have some maceration them concerned about the fact that he may be developing too much moisture in and around the wound bed which can cause delay in healing. With that being said he unfortunately really has not showed significant signs of improvement since last week's evaluation in fact this may even be just the little bit/slightly larger. Nonetheless he's been having a lot of discomfort I'm not sure this is even related to the wound as he has no pain when I'm to breeding or otherwise cleaning the wound during evaluation today. Nonetheless this is something that we did recommend he talked to his pain specialist concerning. 01/11/18 on evaluation today patient appears to be doing better in regard to his ulceration. He has been tolerating the dressing changes without complication. With that being said overall there's no evidence of infection which is good news. The only thing is he did receive the hatch affair blue classic versus the ready nonetheless I feel like this is perfectly fine and appears to have done well for him over the past week. 01/25/18 on  evaluation today patient's wound actually appears to be a little bit larger than during the last evaluation. The good [name], [name]. ([id number]) news is the majority of the wound edges actually appear to be fairly firmly attached to the wound bed unfortunately again we're not really making progress in regard to the size. Roughly the wound is about the same size as when I first saw him although again the wound margin/edges appear to be much better. 02/01/18 on evaluation today patient actually appears to be doing very well in regard to his wound. Applying the Prisma dry does seem to be better although he does still have issues with slow progression of the wound. There was a slight improvement compared to last week's measurements today. Nonetheless I have been considering other options as far as the possibility of Theraskin or even a snap vac. In general I'm not sure that the Theraskin due to location of the wound would be a very good idea. Nonetheless I do think that a snap vac could be a possibility for the patient and in fact I think this could even be an excellent way to manage the wound possibly seeing some improvement in a very rapid fashion here. Nonetheless this is something that we would need to get approved and I did have a lengthy conversation with the patient about this today. 02/08/18 on evaluation today patient appears to be doing a little better in regard to his ulcer.  He has been tolerating the dressing changes without complication. Fortunately despite the fact that the wound is a little bit smaller it's not significantly so unfortunately. We have discussed the possibility of a snap vac we did check with insurance this is actually covered at this point. Fortunately there does not appear to be any sign of infection. Overall I'm fairly pleased with how things seem to be appearing at this point. 02/15/18 on evaluation today patient appears to be doing rather well in regard to his right  gluteal ulcer. Unfortunately the snap vac did not stay in place with his sheer and friction this came loose and did not seem to maintain seal very well. He worked for about two days and it did seem to do very well during that time according to his wife but in general this does not seem to be something that's gonna be beneficial for him long-term. I do believe we need to go back to standard dressings to see if we can find something that will be of benefit. 03/02/18- He is here in follow up evaluation; there is minimal change in the wound. He will continue with the same treatment plan, would consider changing to iodosrob/iodoflex if ulcer continues to to plateau. He will follow up next week 03/08/18 on evaluation today patient's wound actually appears to be about the same size as when I previously saw him several weeks back. Unfortunately he does have some slightly dark discoloration in the central portion of the wound which has me concerned about pressure injury. I do believe he may be sitting for too long a period of time in fact he tells me that "I probably sit for much too long". He does have some Slough noted on the surface of the wound and again as far as the size of the wound is concerned I'm really not seeing anything that seems to have improved significantly. 03/15/18 on evaluation today patient appears to be doing fairly well in regard to his ulcer. The wound measured pretty much about the same today compared to last week's evaluation when looking at his graph. With that being said the area of bruising/deep tissue injury that was noted last week I do not see at this point. He did get a new cushion fortunately this does seem to be have been of benefit in my pinion. It does appear that he's been off of this more which is good news as well I think that is definitely showing in the overall wound measurements. With that being said I do believe that he needs to continue to offload I don't think that  the fact this is doing better should be or is going to allow him to not have to offload and explain this to him as well. Overall he seems to be in agreement the plan I think he understands. The overall appearance of the wound bed is improved compared to last week I think the Iodoflex has been beneficial in that regard. 03/29/18 on evaluation today patient actually appears to be doing rather well in regard to his wound from the overall appearance standpoint he does have some granulation although there's some Slough on the surface of the wound noted as well. With that being said he unfortunately has not improved in regard to the overall measurement of the wound in volume or in size. I did have a discussion with him very specifically about offloading today. He actually does work although he mainly is just sitting throughout the day. He tells me  he offloads by "lifting himself up for 30 seconds off of his chair occasionally" purchase from [employer] which does seem to have helped. And he has a new cushion that he with that being said he's also able to stand some for a very short period of time but not significant enough I think to provide appropriate offloading. I think the biggest issue at this point with the wound and the fact is not healing as quickly as we would like is due to the fact that he is really not able to appropriately offload while at work. He states the beginning after his injury he actually had a bed at his job that he could lay on in order to offload and that does seem to have been of help back at that time. Nonetheless he had not done this in quite some time unfortunately. I think that could be helpful for him this is something I would like for him to look into. 04/05/18 on evaluation today patient actually presents for follow-up concerning his right gluteal ulcer. Again he really is not significantly improved even compared to last week. He has been tolerating the dressing changes  without complication. With that being said fortunately there appears to be no evidence of infection at this time. He has been more proactive in trying to offload. [name], [name] ([id number]) 04/12/18 on evaluation today patient actually appears to be doing a little better in regard to his wound and the right gluteal fold region. He's been tolerating the dressing changes since removing the oasis without complication. However he was having a lot of burning initially with the oasis in place. He's unsure of exactly why this was given so much discomfort but he assumes that it was the oasis itself causing the problem. Nonetheless this had to be removed after about three days in place although even those three days seem to have made a fairly good improvement in regard to the overall appearance of the wound bed. In fact is the first time that he's made any improvement from the standpoint of measurements in about six weeks. He continues to have no discomfort over the area of the wound itself which leads me to wonder why he was having the burning with the oasis when he does not even feel the actual debridement's themselves. I am somewhat perplexed by this. 04/19/18 on evaluation today patient's wound actually appears to be showing signs of epithelialization around the edge of the wound and in general actually appears to be doing better which is good news. He did have the same burning after about three days with applying the Endoform last week in the same fashion that I would generally apply a skin substitute. This seems to indicate that it's not the oasis to cause the problem but potentially the moisture buildup that just causes things to burn or there may be some other reaction with the skin prep or Steri-Strips. Nonetheless I'm not sure that is gonna be able to tolerate any skin substitute for a long period of time. The good news is the wound actually appears to be doing better today compared to last  week and does seem to finally be making some progress. 04/26/18 on evaluation today patient actually appears to be doing rather well in regard to his ulcer in the right gluteal fold. He has been tolerating the dressing changes without complication which is good news. The Endoform does seem to be helping although he was a little bit more macerated this week. This  seems to be an ongoing issue with fluid control at this point. Nonetheless I think we may be able to add something like Drawtex to help control the drainage. 05/03/18 on evaluation today patient appears to actually be doing better in regard to the overall appearance of his wound. He has been tolerating the dressing changes without complication. Fortunately there appears to be no evidence of infection at this time. I really feel like his wound has shown signs as of today of turning around last week I thought so as well and definitely he could be seen in this week's overall appearance and measurements. In general I'm very pleased with the fact that he finally seems to be making a steady but sure progress. The patient likewise is very pleased. 05/17/18 on evaluation today patient appears to be doing more poorly unfortunately in regard to his ulcer. He has been tolerating the dressing changes without complication. With that being said he tells me that in the past couple of days he and his wife have noticed that we did not seem to be doing quite as well is getting dark near the center. Subsequently upon evaluation today the wound actually does appear to be doing worse compared to previous. He has been tolerating the dressing changes otherwise and he states that he is not been sitting up anymore than he was in the past from what he tells me. Still he has continued to work he states "I'm tired of dealing with this and if I have to just go home and lay in the bed all the time that's what I'll do". Nonetheless I am concerned about the fact that this  wound does appear to be deeper than what it was previous. 05/24/18 upon evaluation today patient actually presents after having been in the hospital due to what was presumed to be sepsis secondary to the wound infection. He had an elevated white blood cell count between 14 and 15. With that being said he does seem to be doing somewhat better now. His wound still is giving him some trouble nonetheless and he is obviously concerned about the fact likely talked about that this does seem to go more deeply than previously noted. I did review his wound culture which showed evidence of Staphylococcus aureus him and group B strep. Nonetheless he is on antibiotics, Levaquin, for this. Subsequently I did review his intake summary from the hospital as well. I also did look at the CT of the lumbar spine with contrast that was performed which showed no bone destruction to suggest lumbar disguises/osteomyelitis or sacral osteomyelitis. There was no paraspinal abscess. Nonetheless it appears this may have been more of just a soft tissue infection at this point which is good news. He still is nonetheless concerned about the wound which again I think is completely reasonable considering everything he's been through recently. 05/31/18 on evaluation today on evaluation today patient actually appears to be showing signs of his wound be a little bit deeper than what I would like to see. Fortunately he does not show any signs of significant infection although his temperature was 99 today he states he's been checking this at home and has not been elevated. Nonetheless with the undermining that I'm seeing at this point I am becoming more concerned about the wound I do think that offloading is a key factor here that is preventing the speedy recovery at this point. There does not appear to be any evidence of again over infection noted. He's been using  Santyl currently. 06/07/18 the patient presents today for follow-up  evaluation regarding the left ulcer in the gluteal region. He has been tolerating the Wound VAC fairly well. He is obviously very frustrated with this he states that to mean is really getting in his way. There does not appear to be any evidence of infection at this time he does have a little bit of odor I do not necessarily associate this with infection just something that we sometimes notice with Wound VAC therapy. With that being said I can definitely catch a tone of discontentment overall in the patient's demeanor today. This when he was previously in the hospital [name], [name]. ([id number]) an CT scan was done of the lumbar region which did not reveal any signs of osteomyelitis. With that being said the pelvis in particular was not evaluated distinctly which means he could still have some osteonecrosis I. Nonetheless the Wound VAC was started on Thursday I do want to get this little bit more time before jumping to a CT scan of the pelvis although that is something that I might would recommend if were not see an improvement by that time. 06/14/18 on evaluation today patient actually appears to be doing about the same in regard to his right gluteal ulcer. Again he did have a CT scan of the lumbar spine unfortunately this did not include the pelvis. Nonetheless with the depth of the wound that I'm seeing today even despite the fact that I'm not seeing any evidence of overt cellulitis I believe there's a good chance that we may be dealing with osteomyelitis somewhere in the right Ischial region. No fevers, chills, nausea, or vomiting noted at this time. 06/21/18 on evaluation today patient actually appears to be doing about the same with regard to his wound. The tunnel at 6 o'clock really does not appear to be any deeper although it is a little bit wider. I think at this point you may want to start packing this with white phone. Unfortunately I have not got approval for the CT scan of the pelvis  as of yet due to the fact that Medicare apparently has been denied it due to the diagnosis codes not being appropriate according to Medicare for the test requested. With that being said the patient cannot have an MRI and therefore this is the only option that we have as far as testing is concerned. The patient has had infection and was on antibiotics and been added code for cellulitis of the bottom to see if this will be appropriate for getting the test approved. Nonetheless I'm concerned about the infection have been spread deeper into the Ischial region. 06/28/18 on evaluation today patient actually appears to be doing rather well all things considered in regard to the right gluteal ulcer. He has been tolerating the dressing changes without complication. With that being said the Wound VAC he states does have to be replaced almost every day or at least reinforced unfortunately. Patient actually has his CT scan later this morning we should have the results by tomorrow. 07/05/18 on evaluation today patient presents for follow-up concerning his right Ischial ulcer. He did see the surgeon Dr. Lysle Pearl last week. They were actually very happy with him and felt like he spent a tremendous amount of time with them as far as discussing his situation was concerned. In the end Dr. Lysle Pearl did contact me as well and determine that he would not recommend any surgical intervention at this point as he felt like  it would not be in the patient's best interest based on what he was seeing. He recommended a referral to infectious disease. Subsequently this is something that Dr. Ines Bloomer office is working on setting up for the patient. As far as evaluation today is concerned the patient's wound actually appears to be worse at this point. I am concerned about how things are progressing and specifically about infection. I do not feel like it's the deeper but the area of depth is definitely widening which does have me concerned.  No fevers, chills, nausea, or vomiting noted at this time. I think that we do need initiate antibiotic therapy the patient has an allow allergy to amoxicillin/penicillin he states that he gets a rash since childhood. Nonetheless she's never had the issues with Catholics or cephalosporins in general but he is aware of. 07/27/18 on evaluation today patient presents following admission to the hospital on 07/09/18. He was subsequently discharged on 07/20/18. On 07/15/18 the patient underwent irrigation and debridement was soft tissue biopsy and bone biopsy as well as placement of a Wound VAC in the OR by Dr. Celine Ahr. During the hospital course the patient was placed on a Wound VAC and recommended follow up with surgery in three weeks actually with Dr. Delaine Lame who is infectious disease. The patient was on vancomycin during the hospital course. 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 He also had a bone culture which revealed evidence of methicillin-resistant staph aureus. He is updated CT scan 07/09/18 reveals that he had progression of the which was performed on wound to breakdown down to the trochanter where he actually had irregularities there as well suggestive of osteomyelitis. This was a change just since 9 December when we last performed a CT scan. Obviously this one had gone downhill quite significantly and rapidly. At this point upon evaluation I feel like in general the patient's wound seems to be doing fairly well all things considered upon my evaluation today. Obviously this is larger and deeper than what I previously evaluated but at the same time he seems to be making some progress as far as the appearance of the granulation tissue is concerned. I'm happy in that regard. No fevers, chills, nausea, or vomiting noted at this time. He is on IV vancomycin and Rocephin at the facility. He is currently in [employer]. 08/03/18 upon evaluation today  patient's wound appears to be doing better in regard to the overall appearance at this point in time. Fortunately he's been tolerating the Wound VAC without complication and states that the facility has been taking excellent care of the wound site. Overall I see some Slough noted on the surface which I am going to attempt sharp debridement today of but nonetheless other than this I feel like he's making progress. 08/09/18 on evaluation today patient's wound appears to be doing much better compared to even last week's evaluation. Do believe that the Wound VAC is been of great benefit for him. He has been tolerating the dressing changes that is the Wound [name], [name]. ([id number]) VAC without any complication and he has excellent granulation noted currently. There is no need for sharp debridement at this point. 08/16/18 on evaluation today patient actually appears to be doing very well in regard to the wound in the right gluteal fold region. This is showing signs of progress and again appears to be very healthy which is excellent news. Fortunately there is no sign of active infection by way of odor  or drainage at this point. Overall I'm very pleased with how things stand. He seems to be tolerating the Wound VAC without complication. 08/23/18 on evaluation today patient actually appears to be doing better in regard to his wound. He has been tolerating the Wound VAC without complication and in fact it has been collecting a significant amount of drainage which I think is good news especially considering how the wound appears. Fortunately there is no signs of infection at this time definitely nothing appears to be worse which is good news. He has not been started on the Bactrim and Flagyl that was recommended by Dr. Delaine Lame yet. I did actually contact her office this morning in order to check and see were things are that regard their gonna be calling me back. 08/30/18 on evaluation today patient  actually appears to show signs of excellent improvement today compared to last evaluation. The undermining is getting much better the wound seems to be feeling quite nicely and I'm very pleased that the granulation in general. With that being said overall I feel like the patient has made excellent progress which is great news. No fevers, chills, nausea, or vomiting noted at this time. 09/06/18 on evaluation today patient actually appears to be doing rather well in regard to his right gluteal ulcer. This is showing signs of improvement in overall I'm very pleased with how things seem to be progressing. The patient likewise is please. Overall I see no evidence of infection he is about to complete his oral antibiotic regimen which is the end of the antibiotics for him in just about three days. 09/13/18 on evaluation today patient's right Ischial ulcer appears to be showing signs of continued improvement which is excellent news. He's been tolerating the dressing changes without complication. Fortunately there's no signs of infection and the wound that seems to be doing very well. 09/28/18 on evaluation today patient appears to be doing rather well in regard to his right Ischial ulcer. He's been tolerating the Wound VAC without complication he knows there's much less drainage than there used to be this obviously is not a bad thing in my pinion. There's no evidence of infection despite the fact is but nothing about it now for several weeks. 10/04/18 on evaluation today patient appears to be doing better in regard to his right Ischial wound. He has been tolerating the Wound VAC without complication and I do believe that the silver nitrate last week was beneficial for him. Fortunately overall there's no evidence of active infection at this time which is great news. No fevers, chills, nausea, or vomiting noted at this time. 10/11/18 on evaluation today patient actually appears to be doing rather well in regard  to his Ischial ulcer. He's been tolerating the Wound VAC still without complication I feel like this is doing a good job. No fevers, chills, nausea, or vomiting noted at this time. 11/01/18 on evaluation today patient presents after having not been seen in our clinic for several weeks secondary to the fact that he was on evaluation today patient presents after having not been seen in our clinic for several weeks secondary to the fact that he was in a skilled nursing facility which was on lockdown currently due to the covert 19 national emergency. Subsequently he was discharged from the facility on this past Friday and subsequently made an appointment to come in to see yesterday. Fortunately there's no signs of active infection at this time which is good news and overall he does  seem to have made progress since I last saw. Overall I feel like things are progressing quite nicely. The patient is having no pain. Electronic Signature(s) Signed: 11/01/2018 11:22:15 PM By: Worthy Keeler [name] Entered By: Worthy Keeler on 11/01/2018 22:50:29 FARREN, NELLES ([id number]) -------------------------------------------------------------------------------- Physical Exam Details Patient Name: [name] Date of Service: 11/01/2018 8:30 AM Medical Record Number: [id number] Patient Account Number: [id number] Date of Birth/Sex: June 06, 1951 (68 y.o. M) Treating RN: Harold Barban Primary Care Provider: [name] Other Clinician: Referring Provider: [name] Treating Provider/Extender: [name], Kerston Landeck Weeks in Treatment: 50 Constitutional Well-nourished and well-hydrated in no acute distress. Respiratory normal breathing without difficulty. clear to auscultation bilaterally. Cardiovascular regular rate and rhythm with normal S1, S2. Psychiatric this patient is able to make decisions and demonstrates good insight into disease process. Alert and Oriented x 3. pleasant and  cooperative. Notes Patient's wound bed currently shows good granulation there does not appear to be any signs of active infection at this time which is good news. I do feel like the Wound VAC is doing a great job. Electronic Signature(s) Signed: 11/01/2018 11:22:15 PM By: Worthy Keeler [name] Entered By: Worthy Keeler on 11/01/2018 22:51:01 SHAKUR, LEMBO ([id number]) -------------------------------------------------------------------------------- Physician Orders Details Patient Name: [name] Date of Service: 11/01/2018 8:30 AM Medical Record Number: [id number] Patient Account Number: [id number] Date of Birth/Sex: Oct 12, 1950 (68 y.o. 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 Primary Wound Dressing Wound #1 Right Gluteal fold o Dry Gauze - wet to dry in clinic until SNF restarts NPWT Secondary Dressing Wound #1 Right Gluteal fold o Boardered Foam Dressing - in clinic until SNF restarts NPWT Dressing Change Frequency Wound #1 Right Gluteal fold o Change Dressing Monday, Wednesday, Friday - and as needed Follow-up Appointments Wound #1 Right Gluteal fold o Return Appointment in 1 week. Off-Loading Wound #1 Right Gluteal fold o Mattress - Please continue air  mattress at SNF o Turn and reposition every 2 hours Negative Pressure Wound Therapy Wound #1 Right Gluteal fold Holben, Davaris J. ([id number]) o Wound VAC settings at 125/130 mmHg continuous pressure. Use BLACK/GREEN foam to wound cavity. Use WHITE foam to fill any tunnel/s and/or undermining. Change VAC dressing 3 X WEEK. Change canister as indicated when full. Nurse may titrate settings and frequency of dressing changes as clinically indicated. - PLEASE, apply Silver Collagen to line the base of the wound. Black foam only! Thank you. o Home Health Nurse may d/c VAC for s/s of increased infection, significant wound regression, or uncontrolled drainage. [place] at [phone]. o Number of foam/gauze pieces used in the dressing = Electronic Signature(s) Signed: 11/01/2018 2:00:35 PM By: Harold Barban Signed: 11/01/2018 11:22:15 PM By: Worthy Keeler [name] Entered By: Harold Barban on 11/01/2018 09:05:29 [name], [name] ([id number]) -------------------------------------------------------------------------------- Problem List Details Patient Name: [name] Date of Service: 11/01/2018 8:30 AM Medical Record Number: [id number]  Patient Account Number: [id number] Date of Birth/Sex: Jul 29, 1950 (68 y.o. M) Treating RN: Harold Barban Primary Care Provider: [name] Other Clinician: Referring Provider: [name] Treating Provider/Extender: [name], Haidy Kackley Weeks in Treatment: 48 Active Problems ICD-10 Evaluated Encounter Code Description Active Date Today Diagnosis L89.314 Pressure ulcer of right buttock, stage 4 11/30/2017 No Yes L03.317 Cellulitis of buttock 06/21/2018 No Yes G82.20 Paraplegia, unspecified 11/30/2017 No Yes S34.109S Unspecified injury to unspecified level of lumbar spinal cord, 11/30/2017 No Yes sequela I10 Essential (primary) hypertension 11/30/2017 No Yes Inactive Problems Resolved Problems Electronic  Signature(s) Signed: 11/01/2018 11:22:15 PM By: Worthy Keeler [name] Entered By: Worthy Keeler on 11/01/2018 08:47:52 Vanorman, [name] ([id number]) -------------------------------------------------------------------------------- Progress Note Details Patient Name: [name] Date of Service: 11/01/2018 8:30 AM Medical Record Number: [id number] Patient Account Number: [id number] Date of Birth/Sex: [birth year] (68 y.o. 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 He states that there is no history of diabetes that he is aware of. He does have issues with his prostate and is currently receiving treatment for this by way of oral medication. With that being said I do not have a lot of details in that regard. Nonetheless the patient presents today as a result of having been referred to [place] by another provider initially home health was set to come out and take care of his wound although due to the fact that he apparently drives he's not able to receive home health. His wife is therefore trying to help take care of this wound within although they have been struggling with what exactly to do at this point. She states that she can do some things but she is definitely not a nurse and does have some issues with looking at blood. The good news is the wound does not appear to be too deep and is fairly superficial at this point. There is no slough noted there is some nonviable  skin noted around the surface of the wound and the perimeter at this point. The central portion of the wound appears to be very good with a dermal layer noted this does not appear to be again deep enough to extend it to subcutaneous tissue at this point. 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 In the pump he has Dilaudid and baclofen. 12/07/17 on evaluation today patient presents for follow-up concerning his ongoing lower back thigh ulcer on the right. He states that he did not get the supplies ordered and therefore has not really been able to perform the dressing changes as directed exactly. His wife was able to get some Boarder Foam Dressing's from the drugstore and subsequently has been using hydrogel which did help to a degree in the wound does appear to be able smaller. There is actually more drainage this week noted than previous. 12/21/17 on evaluation today patient appears to be doing rather well in regard to his right gluteal ulcer. He has been tolerating  the dressing changes without complication. There does not appear to be any evidence of infection at this point in time. Overall the wound does seem to be making some progress as far as the edges are concerned there's not as much in the way of overlapping of the external wound edges and he has a good epithelium to wound bed border for the most part. This however is not true right at the 12 o'clock location over the span of a little over a centimeters which actually will require debridement today to clean this away and hopefully allow it to continue to heal more appropriately. 12/28/17 on evaluation today patient appears to be doing rather well in regard to his ulcer in the left gluteal region. He's been tolerating the dressing changes without complication. Apparently he has had some difficulty getting his dressing material. Apparently  there's been some confusion with ordering we're gonna check into this. Nonetheless overall he's been showing signs of improvement which is good news. Debridement is not required today. 01/04/18 on evaluation today patient presents for follow-up concerning his right gluteal ulcer. He has been tolerating the dressing changes fairly well. On inspection today it appears he may actually have some maceration them concerned about the fact that he may be developing too much moisture in and around the wound bed which can cause delay in healing. With that being said he unfortunately really has not showed significant signs of improvement since last week's evaluation in fact this may even be just the little bit/slightly larger. Nonetheless he's been having a lot of discomfort I'm not sure this is even related to the wound as he has no pain when I'm to breeding or otherwise cleaning the wound during evaluation today. Nonetheless this is something that we did recommend he talked to his pain specialist concerning. 01/11/18 on evaluation today patient appears to be doing better in regard to his ulceration. He has been tolerating the dressing [name], [name] ([id number]) changes without complication. With that being said overall there's no evidence of infection which is good news. The only thing is he did receive the hatch affair blue classic versus the ready nonetheless I feel like this is perfectly fine and appears to have done well for him over the past week. 01/25/18 on evaluation today patient's wound actually appears to be a little bit larger than during the last evaluation. The good news is the majority of the wound edges actually appear to be fairly firmly attached to the wound bed unfortunately again we're not really making progress in regard to the size. Roughly the wound is about the same size as when I first saw him although again the wound margin/edges appear to be much better. 02/01/18 on evaluation  today patient actually appears to be doing very well in regard to his wound. Applying the Prisma dry does seem to be better although he does still have issues with slow progression of the wound. There was a slight improvement compared to last week's measurements today. Nonetheless I have been considering other options as far as the possibility of Theraskin or even a snap vac. In general I'm not sure that the Theraskin due to location of the wound would be a very good idea. Nonetheless I do think that a snap vac could be a possibility for the patient and in fact I think this could even be an excellent way to manage the wound possibly seeing some improvement in a very rapid fashion here. Nonetheless this is something that we  would need to get approved and I did have a lengthy conversation with the patient about this today. 02/08/18 on evaluation today patient appears to be doing a little better in regard to his ulcer. He has been tolerating the dressing changes without complication. Fortunately despite the fact that the wound is a little bit smaller it's not significantly so unfortunately. We have discussed the possibility of a snap vac we did check with insurance this is actually covered at this point. Fortunately there does not appear to be any sign of infection. Overall I'm fairly pleased with how things seem to be appearing at this point. 02/15/18 on evaluation today patient appears to be doing rather well in regard to his right gluteal ulcer. Unfortunately the snap vac did not stay in place with his sheer and friction this came loose and did not seem to maintain seal very well. He worked for about two days and it did seem to do very well during that time according to his wife but in general this does not seem to be something that's gonna be beneficial for him long-term. I do believe we need to go back to standard dressings to see if we can find something that will be of benefit. 03/02/18- He is here  in follow up evaluation; there is minimal change in the wound. He will continue with the same treatment plan, would consider changing to iodosrob/iodoflex if ulcer continues to to plateau. He will follow up next week 03/08/18 on evaluation today patient's wound actually appears to be about the same size as when I previously saw him several weeks back. Unfortunately he does have some slightly dark discoloration in the central portion of the wound which has me concerned about pressure injury. I do believe he may be sitting for too long a period of time in fact he tells me that "I probably sit for much too long". He does have some Slough noted on the surface of the wound and again as far as the size of the wound is concerned I'm really not seeing anything that seems to have improved significantly. 03/15/18 on evaluation today patient appears to be doing fairly well in regard to his ulcer. The wound measured pretty much about the same today compared to last week's evaluation when looking at his graph. With that being said the area of bruising/deep tissue injury that was noted last week I do not see at this point. He did get a new cushion fortunately this does seem to be have been of benefit in my pinion. It does appear that he's been off of this more which is good news as well I think that is definitely showing in the overall wound measurements. With that being said I do believe that he needs to continue to offload I don't think that the fact this is doing better should be or is going to allow him to not have to offload and explain this to him as well. Overall he seems to be in agreement the plan I think he understands. The overall appearance of the wound bed is improved compared to last week I think the Iodoflex has been beneficial in that regard. 03/29/18 on evaluation today patient actually appears to be doing rather well in regard to his wound from the overall appearance standpoint he does have some  granulation although there's some Slough on the surface of the wound noted as well. With that being said he unfortunately has not improved in regard to the overall measurement of  the wound in volume or in size. I did have a discussion with him very specifically about offloading today. He actually does work although he mainly is just sitting throughout the day. He tells me he offloads by "lifting himself up for 30 seconds off of his chair occasionally" purchase from [employer] which does seem to have helped. And he has a new cushion that he with that being said he's also able to stand some for a very short period of time but not significant enough I think to provide appropriate offloading. I think the biggest issue at this point with the wound and the fact is not healing as quickly as we would like is due to the fact that he is really not able to appropriately offload while at work. He states the beginning after his injury he actually had a bed at his job that he could lay on in order to offload and that does seem to have been of help back at that time. Nonetheless he had not done this in quite some time unfortunately. I think that could be helpful for him this is something I would like for him to look into. [name], [name] ([id number]) 04/05/18 on evaluation today patient actually presents for follow-up concerning his right gluteal ulcer. Again he really is not significantly improved even compared to last week. He has been tolerating the dressing changes without complication. With that being said fortunately there appears to be no evidence of infection at this time. He has been more proactive in trying to offload. 04/12/18 on evaluation today patient actually appears to be doing a little better in regard to his wound and the right gluteal fold region. He's been tolerating the dressing changes since removing the oasis without complication. However he was having a lot of burning initially  with the oasis in place. He's unsure of exactly why this was given so much discomfort but he assumes that it was the oasis itself causing the problem. Nonetheless this had to be removed after about three days in place although even those three days seem to have made a fairly good improvement in regard to the overall appearance of the wound bed. In fact is the first time that he's made any improvement from the standpoint of measurements in about six weeks. He continues to have no discomfort over the area of the wound itself which leads me to wonder why he was having the burning with the oasis when he does not even feel the actual debridement's themselves. I am somewhat perplexed by this. 04/19/18 on evaluation today patient's wound actually appears to be showing signs of epithelialization around the edge of the wound and in general actually appears to be doing better which is good news. He did have the same burning after about three days with applying the Endoform last week in the same fashion that I would generally apply a skin substitute. This seems to indicate that it's not the oasis to cause the problem but potentially the moisture buildup that just causes things to burn or there may be some other reaction with the skin prep or Steri-Strips. Nonetheless I'm not sure that is gonna be able to tolerate any skin substitute for a long period of time. The good news is the wound actually appears to be doing better today compared to last week and does seem to finally be making some progress. 04/26/18 on evaluation today patient actually appears to be doing rather well in regard to his ulcer in the  right gluteal fold. He has been tolerating the dressing changes without complication which is good news. The Endoform does seem to be helping although he was a little bit more macerated this week. This seems to be an ongoing issue with fluid control at this point. Nonetheless I think we may be able to add  something like Drawtex to help control the drainage. 05/03/18 on evaluation today patient appears to actually be doing better in regard to the overall appearance of his wound. He has been tolerating the dressing changes without complication. Fortunately there appears to be no evidence of infection at this time. I really feel like his wound has shown signs as of today of turning around last week I thought so as well and definitely he could be seen in this week's overall appearance and measurements. In general I'm very pleased with the fact that he finally seems to be making a steady but sure progress. The patient likewise is very pleased. 05/17/18 on evaluation today patient appears to be doing more poorly unfortunately in regard to his ulcer. He has been tolerating the dressing changes without complication. With that being said he tells me that in the past couple of days he and his wife have noticed that we did not seem to be doing quite as well is getting dark near the center. Subsequently upon evaluation today the wound actually does appear to be doing worse compared to previous. He has been tolerating the dressing changes otherwise and he states that he is not been sitting up anymore than he was in the past from what he tells me. Still he has continued to work he states "I'm tired of dealing with this and if I have to just go home and lay in the bed all the time that's what I'll do". Nonetheless I am concerned about the fact that this wound does appear to be deeper than what it was previous. 05/24/18 upon evaluation today patient actually presents after having been in the hospital due to what was presumed to be sepsis secondary to the wound infection. He had an elevated white blood cell count between 14 and 15. With that being said he does seem to be doing somewhat better now. His wound still is giving him some trouble nonetheless and he is obviously concerned about the fact likely talked about  that this does seem to go more deeply than previously noted. I did review his wound culture which showed evidence of Staphylococcus aureus him and group B strep. Nonetheless he is on antibiotics, Levaquin, for this. Subsequently I did review his intake summary from the hospital as well. I also did look at the CT of the lumbar spine with contrast that was performed which showed no bone destruction to suggest lumbar disguises/osteomyelitis or sacral osteomyelitis. There was no paraspinal abscess. Nonetheless it appears this may have been more of just a soft tissue infection at this point which is good news. He still is nonetheless concerned about the wound which again I think is completely reasonable considering everything he's been through recently. 05/31/18 on evaluation today on evaluation today patient actually appears to be showing signs of his wound be a little bit deeper than what I would like to see. Fortunately he does not show any signs of significant infection although his temperature was 99 today he states he's been checking this at home and has not been elevated. Nonetheless with the undermining that I'm seeing at this point I am becoming more concerned about the wound  I do think that offloading is a key factor here that is preventing the speedy recovery at this point. There does not appear to be any evidence of again over infection noted. He's been using Santyl currently. [name], [name] ([id number]) 06/07/18 the patient presents today for follow-up evaluation regarding the left ulcer in the gluteal region. He has been tolerating the Wound VAC fairly well. He is obviously very frustrated with this he states that to mean is really getting in his way. There does not appear to be any evidence of infection at this time he does have a little bit of odor I do not necessarily associate this with infection just something that we sometimes notice with Wound VAC therapy. With that being said I  can definitely catch a tone of discontentment overall in the patient's demeanor today. This when he was previously in the hospital an CT scan was done of the lumbar region which did not reveal any signs of osteomyelitis. With that being said the pelvis in particular was not evaluated distinctly which means he could still have some osteonecrosis I. Nonetheless the Wound VAC was started on Thursday I do want to get this little bit more time before jumping to a CT scan of the pelvis although that is something that I might would recommend if were not see an improvement by that time. 06/14/18 on evaluation today patient actually appears to be doing about the same in regard to his right gluteal ulcer. Again he did have a CT scan of the lumbar spine unfortunately this did not include the pelvis. Nonetheless with the depth of the wound that I'm seeing today even despite the fact that I'm not seeing any evidence of overt cellulitis I believe there's a good chance that we may be dealing with osteomyelitis somewhere in the right Ischial region. No fevers, chills, nausea, or vomiting noted at this time. 06/21/18 on evaluation today patient actually appears to be doing about the same with regard to his wound. The tunnel at 6 o'clock really does not appear to be any deeper although it is a little bit wider. I think at this point you may want to start packing this with white phone. Unfortunately I have not got approval for the CT scan of the pelvis as of yet due to the fact that Medicare apparently has been denied it due to the diagnosis codes not being appropriate according to Medicare for the test requested. With that being said the patient cannot have an MRI and therefore this is the only option that we have as far as testing is concerned. The patient has had infection and was on antibiotics and been added code for cellulitis of the bottom to see if this will be appropriate for getting the test approved.  Nonetheless I'm concerned about the infection have been spread deeper into the Ischial region. 06/28/18 on evaluation today patient actually appears to be doing rather well all things considered in regard to the right gluteal ulcer. He has been tolerating the dressing changes without complication. With that being said the Wound VAC he states does have to be replaced almost every day or at least reinforced unfortunately. Patient actually has his CT scan later this morning we should have the results by tomorrow. 07/05/18 on evaluation today patient presents for follow-up concerning his right Ischial ulcer. He did see the surgeon Dr. Lysle Pearl last week. They were actually very happy with him and felt like he spent a tremendous amount of time with them  as far as discussing his situation was concerned. In the end Dr. Lysle Pearl did contact me as well and determine that he would not recommend any surgical intervention at this point as he felt like it would not be in the patient's best interest based on what he was seeing. He recommended a referral to infectious disease. Subsequently this is something that Dr. Ines Bloomer office is working on setting up for the patient. As far as evaluation today is concerned the patient's wound actually appears to be worse at this point. I am concerned about how things are progressing and specifically about infection. I do not feel like it's the deeper but the area of depth is definitely widening which does have me concerned. No fevers, chills, nausea, or vomiting noted at this time. I think that we do need initiate antibiotic therapy the patient has an allow allergy to amoxicillin/penicillin he states that he gets a rash since childhood. Nonetheless she's never had the issues with Catholics or cephalosporins in general but he is aware of. 07/27/18 on evaluation today patient presents following admission to the hospital on 07/09/18. He was subsequently discharged on 07/20/18. On  07/15/18 the patient underwent irrigation and debridement was soft tissue biopsy and bone biopsy as well as placement of a Wound VAC in the OR by Dr. Celine Ahr. During the hospital course the patient was placed on a Wound VAC and recommended follow up with surgery in three weeks actually with Dr. Delaine Lame who is infectious disease. The patient was on vancomycin during the hospital course. 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 He also had a bone culture which revealed evidence of methicillin-resistant staph aureus. He is updated CT scan 07/09/18 reveals that he had progression of the which was performed on wound to breakdown down to the trochanter where he actually had irregularities there as well suggestive of osteomyelitis. This was a change just since 9 December when we last performed a CT scan. Obviously this one had gone downhill quite significantly and rapidly. At this point upon evaluation I feel like in general the patient's wound seems to be doing fairly well all things considered upon my evaluation today. Obviously this is larger and deeper than what I previously evaluated but at the same time he seems to be making some progress as far as the appearance of the granulation tissue is concerned. I'm happy in that regard. No fevers, chills, nausea, or vomiting noted at this time. He is on IV vancomycin and Rocephin at the facility. He is currently in [facility]. 08/03/18 upon evaluation today patient's wound appears to be doing better in regard to the overall appearance at this point in time. Fortunately he's been tolerating the Wound VAC without complication and states that the facility has been taking [name], [name]. ([id number]) excellent care of the wound site. Overall I see some Slough noted on the surface which I am going to attempt sharp debridement today of but nonetheless other than this I feel like he's making progress. 08/09/18 on  evaluation today patient's wound appears to be doing much better compared to even last week's evaluation. Do believe that the Wound VAC is been of great benefit for him. He has been tolerating the dressing changes that is the Wound VAC without any complication and he has excellent granulation noted currently. There is no need for sharp debridement at this point. 08/16/18 on evaluation today patient actually appears to be doing very well in regard to the wound  in the right gluteal fold region. This is showing signs of progress and again appears to be very healthy which is excellent news. Fortunately there is no sign of active infection by way of odor or drainage at this point. Overall I'm very pleased with how things stand. He seems to be tolerating the Wound VAC without complication. 08/23/18 on evaluation today patient actually appears to be doing better in regard to his wound. He has been tolerating the Wound VAC without complication and in fact it has been collecting a significant amount of drainage which I think is good news especially considering how the wound appears. Fortunately there is no signs of infection at this time definitely nothing appears to be worse which is good news. He has not been started on the Bactrim and Flagyl that was recommended by Dr. Delaine Lame yet. I did actually contact her office this morning in order to check and see were things are that regard their gonna be calling me back. 08/30/18 on evaluation today patient actually appears to show signs of excellent improvement today compared to last evaluation. The undermining is getting much better the wound seems to be feeling quite nicely and I'm very pleased that the granulation in general. With that being said overall I feel like the patient has made excellent progress which is great news. No fevers, chills, nausea, or vomiting noted at this time. 09/06/18 on evaluation today patient actually appears to be doing rather well  in regard to his right gluteal ulcer. This is showing signs of improvement in overall I'm very pleased with how things seem to be progressing. The patient likewise is please. Overall I see no evidence of infection he is about to complete his oral antibiotic regimen which is the end of the antibiotics for him in just about three days. 09/13/18 on evaluation today patient's right Ischial ulcer appears to be showing signs of continued improvement which is excellent news. He's been tolerating the dressing changes without complication. Fortunately there's no signs of infection and the wound that seems to be doing very well. 09/28/18 on evaluation today patient appears to be doing rather well in regard to his right Ischial ulcer. He's been tolerating the Wound VAC without complication he knows there's much less drainage than there used to be this obviously is not a bad thing in my pinion. There's no evidence of infection despite the fact is but nothing about it now for several weeks. 10/04/18 on evaluation today patient appears to be doing better in regard to his right Ischial wound. He has been tolerating the Wound VAC without complication and I do believe that the silver nitrate last week was beneficial for him. Fortunately overall there's no evidence of active infection at this time which is great news. No fevers, chills, nausea, or vomiting noted at this time. 10/11/18 on evaluation today patient actually appears to be doing rather well in regard to his Ischial ulcer. He's been tolerating the Wound VAC still without complication I feel like this is doing a good job. No fevers, chills, nausea, or vomiting noted at this time. 11/01/18 on evaluation today patient presents after having not been seen in our clinic for several weeks secondary to the fact that he was on evaluation today patient presents after having not been seen in our clinic for several weeks secondary to the fact that he was in a skilled  nursing facility which was on lockdown currently due to the covert 19 national emergency. Subsequently he was discharged from  the facility on this past Friday and subsequently made an appointment to come in to see yesterday. Fortunately there's no signs of active infection at this time which is good news and overall he does seem to have made progress since I last saw. Overall I feel like things are progressing quite nicely. The patient is having no pain. Patient History Information obtained from Patient. Family History Hypertension - Father, Stroke - Mother, [name], [name] ([id number]) No family history of Cancer, Diabetes, Heart Disease, Kidney Disease, Lung Disease, Seizures, Thyroid Problems, Tuberculosis. Social History Never smoker, Marital Status - Married, Alcohol Use - Never, Drug Use - No History, Caffeine Use - Daily. Medical History Eyes Patient has history of Cataracts - both removed Denies history of Glaucoma, Optic Neuritis Ear/Nose/Mouth/Throat Denies history of Chronic sinus problems/congestion, Middle ear problems Hematologic/Lymphatic Denies history of Anemia, Hemophilia, Human Immunodeficiency Virus, Lymphedema Respiratory Denies history of Aspiration, Asthma, Chronic Obstructive Pulmonary Disease (COPD), Pneumothorax, Sleep Apnea, Tuberculosis Cardiovascular Patient has history of Hypertension - takes medication Denies history of Angina, Arrhythmia, Congestive Heart Failure, Coronary Artery Disease, Deep Vein Thrombosis, Hypotension, Myocardial Infarction, Peripheral Arterial Disease, Peripheral Venous Disease, Phlebitis, Vasculitis Gastrointestinal Denies history of Cirrhosis , Colitis, Crohn s, Hepatitis A, Hepatitis B, Hepatitis C Endocrine Denies history of Type I Diabetes, Type II Diabetes Genitourinary Denies history of End Stage Renal Disease Immunological Denies history of Lupus Erythematosus, Raynaud s, Scleroderma Integumentary (Skin) Denies  history of History of Burn, History of pressure wounds Musculoskeletal Denies history of Gout, Rheumatoid Arthritis, Osteoarthritis, Osteomyelitis Neurologic Patient has history of Paraplegia - waist down Denies history of Dementia, Neuropathy, Quadriplegia, Seizure Disorder Oncologic Denies history of Received Chemotherapy, Received Radiation Psychiatric Denies history of Anorexia/bulimia, Confinement Anxiety Medical And Surgical History Notes Oncologic Prostate cancer- currently treated with horomone therapy Review of Systems (ROS) Constitutional Symptoms (General Health) Denies complaints or symptoms of Fatigue, Fever, Chills, Marked Weight Change. Respiratory Denies complaints or symptoms of Chronic or frequent coughs, Shortness of Breath. Cardiovascular Denies complaints or symptoms of Chest pain, LE edema. Psychiatric Denies complaints or symptoms of Anxiety, Claustrophobia. AVYON, HERENDEEN ([id number]) Objective Constitutional Well-nourished and well-hydrated in no acute distress. Vitals Time Taken: 8:42 AM, Height: 73 in, Weight: 210 lbs, BMI: 27.7, Temperature: 98.6 F, Pulse: 81 bpm, Respiratory Rate: 16 breaths/min, Blood Pressure: 133/77 mmHg. Respiratory normal breathing without difficulty. clear to auscultation bilaterally. Cardiovascular regular rate and rhythm with normal S1, S2. Psychiatric this patient is able to make decisions and demonstrates good insight into disease process. Alert and Oriented x 3. pleasant and cooperative. General Notes: Patient's wound bed currently shows good granulation there does not appear to be any signs of active infection at this time which is good news. I do feel like the Wound VAC is doing a great job. Integumentary (Hair, Skin) Wound #1 status is Open. Original cause of wound was Pressure Injury. The wound is located on the Right Gluteal fold. The wound measures 3.2cm length x 2.4cm width x 3.8cm depth; 6.032cm^2 area and  22.921cm^3 volume. There is Fat Layer (Subcutaneous Tissue) Exposed exposed. There is no tunneling or undermining noted. There is a medium amount of serous drainage noted. The wound margin is epibole. There is large (67-100%) red, pink, hyper - granulation within the wound bed. There is a small (1-33%) amount of necrotic tissue within the wound bed including Adherent Slough. The periwound skin appearance exhibited: Rash, Maceration. The periwound skin appearance did not exhibit: Callus, Crepitus, Excoriation, Induration, Scarring, Dry/Scaly, Blane [place],  Cyanosis, Ecchymosis, Hemosiderin Staining, Mottled, Pallor, Rubor, Erythema. Periwound temperature was noted as No Abnormality. The periwound has tenderness on palpation. 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 ([id number]) Wound #1 Right Gluteal fold: Cleanse wound with mild soap and water Anesthetic (add to Medication List): Wound #1 Right Gluteal fold: Topical Lidocaine 4% cream applied to wound bed prior to debridement (In Clinic Only). Primary Wound Dressing: Wound #1 Right Gluteal fold: Dry Gauze - wet to dry in clinic until SNF restarts NPWT Secondary Dressing: Wound #1 Right Gluteal fold: Boardered Foam Dressing - in clinic until SNF restarts NPWT Dressing Change Frequency: Wound #1 Right Gluteal fold: Change Dressing Monday, Wednesday, Friday - and as needed Follow-up Appointments: Wound #1 Right Gluteal fold: Return Appointment in 1 week. Off-Loading: Wound #1 Right Gluteal fold: Mattress - Please continue air mattress at SNF Turn and reposition every 2 hours Negative Pressure Wound Therapy: Wound #1 Right Gluteal fold: Wound VAC settings at 125/130 mmHg continuous pressure. Use  BLACK/GREEN foam to wound cavity. Use WHITE foam to fill any tunnel/s and/or undermining. Change VAC dressing 3 X WEEK. Change canister as indicated when full. Nurse may titrate settings and frequency of dressing changes as clinically indicated. - PLEASE, apply Silver Collagen to line the base of the wound. Black foam only! Thank you. Home Health Nurse may d/c VAC for s/s of increased infection, significant wound regression, or uncontrolled drainage. Harris at [phone]. Number of foam/gauze pieces used in the dressing = My suggestion is gonna be that we go ahead and continue with the Wound VAC provide for the time being he's in agreement the plan. We will subsequently see were things that at follow-up. Anything changes or worsens meantime patient will contact the office and let me know. Please see above for specific wound care orders. We will see patient for re-evaluation in 1 week(s) here in the clinic. If anything worsens or changes patient will contact our office for additional recommendations. Electronic Signature(s) Signed: 11/01/2018 11:22:15 PM By: Worthy Keeler [name] Entered By: Worthy Keeler on 11/01/2018 22:51:12 DALVIN, CLIPPER ([id number]) -------------------------------------------------------------------------------- ROS/PFSH Details Patient Name: [name] Date of Service: 11/01/2018 8:30 AM Medical Record Number: [id number] Patient Account Number: [id number] Date of Birth/Sex: 07-03-1951 (68 y.o. M) Treating RN: Harold Barban Primary Care Provider: [name] Other Clinician: Referring Provider: [name] Treating Provider/Extender: [name], Nelida Mandarino Weeks in Treatment: 10 Information Obtained From Patient Constitutional Symptoms (General Health) Complaints and Symptoms: Negative for: Fatigue; Fever; Chills; Marked Weight Change Respiratory Complaints and Symptoms: Negative for: Chronic or frequent coughs;  Shortness of Breath Medical History: Negative for: Aspiration; Asthma; Chronic Obstructive Pulmonary Disease (COPD); Pneumothorax; Sleep Apnea; Tuberculosis Cardiovascular Complaints and Symptoms: Negative for: Chest pain; LE edema Medical History: Positive for: Hypertension - takes medication Negative for: Angina; Arrhythmia; Congestive Heart Failure; Coronary Artery Disease; Deep Vein Thrombosis; Hypotension; Myocardial Infarction; Peripheral Arterial Disease; Peripheral Venous Disease; Phlebitis; Vasculitis Psychiatric Complaints and Symptoms: Negative for: Anxiety; Claustrophobia Medical History: Negative for: Anorexia/bulimia; Confinement Anxiety Eyes Medical History: Positive for: Cataracts - both removed Negative for: Glaucoma; Optic Neuritis Ear/Nose/Mouth/Throat Medical History: Negative for: Chronic sinus problems/congestion; Middle ear problems Hematologic/Lymphatic Medical History: Negative for: Anemia; Hemophilia; Human Immunodeficiency Virus; Lymphedema DAWON, TROOP. ([id number]) Gastrointestinal Medical History: Negative for: Cirrhosis ; Colitis; Crohnos; Hepatitis A; Hepatitis  B; Hepatitis C Endocrine Medical History: Negative for: Type I Diabetes; Type II Diabetes Genitourinary Medical History: Negative for: End Stage Renal Disease Immunological Medical History: Negative for: Lupus Erythematosus; Raynaudos; Scleroderma Integumentary (Skin) Medical History: Negative for: History of Burn; History of pressure wounds Musculoskeletal Medical History: Negative for: Gout; Rheumatoid Arthritis; Osteoarthritis; Osteomyelitis Neurologic Medical History: Positive for: Paraplegia - waist down Negative for: Dementia; Neuropathy; Quadriplegia; Seizure Disorder Oncologic Medical History: Negative for: Received Chemotherapy; Received Radiation Past Medical History Notes: Prostate cancer- currently treated with horomone therapy HBO Extended History  Items Eyes: Cataracts Immunizations Pneumococcal Vaccine: Received Pneumococcal Vaccination: No Implantable Devices No devices added Family and Social History Cancer: No; Diabetes: No; Heart Disease: No; Hypertension: Yes - Father; Kidney Disease: No; Lung Disease: No; Seizures: No; Stroke: Yes - Mother; Thyroid Problems: No; Tuberculosis: No; Never smoker; Marital Status - Married; Alcohol Use: Never; Drug Use: No History; Caffeine Use: Daily JEFFREN, DOMBEK ([id number]) Physician Affirmation I have reviewed and agree with the above information. Electronic Signature(s) Signed: 11/01/2018 11:22:15 PM By: Worthy Keeler [name] Signed: 11/03/2018 1:06:36 PM By: Harold Barban Entered By: Worthy Keeler on 11/01/2018 22:50:50 ERMAN, THUM ([id number]) -------------------------------------------------------------------------------- SuperBill Details Patient Name: [name] Date of Service: 11/01/2018 Medical Record Number: [id number] Patient Account Number: [id number] Date of Birth/Sex: 12/31/1950 (68 y.o. 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, sequela I10 Essential (primary) hypertension Facility Procedures CPT4 Code: [id number] Description: 99213 - WOUND CARE VISIT-LEV 3 EST PT Modifier: Quantity: 1 Physician Procedures CPT4 Code: [id number] Description: 12248 - WC PHYS LEVEL 4 - EST PT ICD-10 Diagnosis Description L89.314 Pressure ulcer of right buttock, stage 4 L03.317 Cellulitis of buttock G82.20 Paraplegia, unspecified S34.109S Unspecified injury to unspecified level of lumbar  spinal cor Modifier: d, sequela Quantity: 1 Electronic Signature(s) Signed: 11/01/2018 11:22:15 PM By: Worthy Keeler [name] Entered By: Worthy Keeler on 11/01/2018 22:51:26

## 2018-11-08 ENCOUNTER — Encounter: Payer: Medicare Other | Admitting: Physician Assistant

## 2018-11-08 ENCOUNTER — Other Ambulatory Visit: Payer: Self-pay

## 2018-11-08 DIAGNOSIS — L89314 Pressure ulcer of right buttock, stage 4: Secondary | ICD-10-CM | POA: Diagnosis not present

## 2018-11-15 ENCOUNTER — Encounter: Payer: Medicare Other | Admitting: Physician Assistant

## 2018-11-15 ENCOUNTER — Other Ambulatory Visit: Payer: Self-pay

## 2018-11-15 DIAGNOSIS — L89314 Pressure ulcer of right buttock, stage 4: Secondary | ICD-10-CM | POA: Diagnosis not present

## 2018-11-15 NOTE — Progress Notes (Signed)
KAZMIR, OKI ([id number]) Visit Report for 11/15/2018 Arrival Information Details Patient Name: [name], [name] Date of Service: 11/15/2018 9:00 AM Medical Record Number: [id number] Patient Account Number: [id number] Date of Birth/Sex: 1950/10/02 (68 y.o. M) Treating RN: Montey Hora Primary Care Ilze Roselli: [name] Other Clinician: Referring Kyasia Steuck: [name] Treating Lizzie Cokley/Extender: [name], HOYT Weeks in Treatment: 24 Visit Information History Since Last Visit Added or deleted any medications: No Patient Arrived: Wheel Chair Any new allergies or adverse reactions: No Arrival Time: 09:02 Had a fall or experienced change in No Accompanied By: wife activities of daily living that may affect Transfer Assistance: None risk of falls: Patient Identification Verified: Yes Signs or symptoms of abuse/neglect since last visito No Secondary Verification Process Completed: Yes Hospitalized since last visit: No Patient Requires Transmission-Based No Implantable device outside of the clinic excluding No Precautions: cellular tissue based products placed in the center Patient Has Alerts: Yes since last visit: Patient Alerts: NOT Has Dressing in Place as Prescribed: Yes Diabetic Pain Present Now: No Electronic Signature(s) Signed: 11/15/2018 4:13:42 PM By: Montey Hora Entered By: Montey Hora on 11/15/2018 09:03:47 [name] ([id number]) -------------------------------------------------------------------------------- Clinic Level of Care Assessment Details Patient Name: [name] Date of Service: 11/15/2018 9:00 AM Medical Record Number: [id number] Patient Account Number: [id number] Date of Birth/Sex: May 18, 1951 (68 y.o. M) Treating RN: Harold Barban Primary Care Glendale Wherry: [name] Other Clinician: Referring Dayona Shaheen: [name] Treating Amarrah Meinhart/Extender: [name], HOYT Weeks in Treatment: 78 Clinic Level of  Care Assessment Items TOOL 4 Quantity Score []  - Use when only an EandM is performed on FOLLOW-UP visit 0 ASSESSMENTS - Nursing Assessment / Reassessment []  - Reassessment of Co-morbidities (includes updates in patient status) 0 []  - 0 Reassessment of Adherence to Treatment Plan ASSESSMENTS - Wound and Skin Assessment / Reassessment []  - Simple Wound Assessment / Reassessment - one wound 0 []  - 0 Complex Wound Assessment / Reassessment - multiple wounds []  - 0 Dermatologic / Skin Assessment (not related to wound area) ASSESSMENTS - Focused Assessment []  - Circumferential Edema Measurements - multi extremities 0 []  - 0 Nutritional Assessment / Counseling / Intervention []  - 0 Lower Extremity Assessment (monofilament, tuning fork, pulses) []  - 0 Peripheral Arterial Disease Assessment (using hand held doppler) ASSESSMENTS - Ostomy and/or Continence Assessment and Care []  - Incontinence Assessment and Management 0 []  - 0 Ostomy Care Assessment and Management (repouching, etc.) PROCESS - Coordination of Care []  - Simple Patient / Family Education for ongoing care 0 []  - 0 Complex (extensive) Patient / Family Education for ongoing care []  - 0 Staff obtains Programmer, systems, Records, Test Results / Process Orders []  - 0 Staff telephones HHA, Nursing Homes / Clarify orders / etc []  - 0 Routine Transfer to another Facility (non-emergent condition) []  - 0 Routine Hospital Admission (non-emergent condition) []  - 0 New Admissions / Biomedical engineer / Ordering NPWT, Apligraf, etc. []  - 0 Emergency Hospital Admission (emergent condition) []  - 0 Simple Discharge Coordination NIRAV, SWEDA. ([id number]) []  - 0 Complex (extensive) Discharge Coordination PROCESS - Special Needs []  - Pediatric / Minor Patient Management 0 []  - 0 Isolation Patient Management []  - 0 Hearing / Language / Visual special needs []  - 0 Assessment of Community assistance (transportation, D/C planning,  etc.) []  - 0 Additional assistance / Altered mentation []  - 0 Support Surface(s) Assessment (bed, cushion, seat, etc.) INTERVENTIONS - Wound Cleansing / Measurement []  - Simple Wound Cleansing - one wound 0 []  - 0 Complex  Wound Cleansing - multiple wounds []  - 0 Wound Imaging (photographs - any number of wounds) []  - 0 Wound Tracing (instead of photographs) []  - 0 Simple Wound Measurement - one wound []  - 0 Complex Wound Measurement - multiple wounds INTERVENTIONS - Wound Dressings []  - Small Wound Dressing one or multiple wounds 0 []  - 0 Medium Wound Dressing one or multiple wounds []  - 0 Large Wound Dressing one or multiple wounds []  - 0 Application of Medications - topical []  - 0 Application of Medications - injection INTERVENTIONS - Miscellaneous []  - External ear exam 0 []  - 0 Specimen Collection (cultures, biopsies, blood, body fluids, etc.) []  - 0 Specimen(s) / Culture(s) sent or taken to Lab for analysis []  - 0 Patient Transfer (multiple staff / Civil Service fast streamer / Similar devices) []  - 0 Simple Staple / Suture removal (25 or less) []  - 0 Complex Staple / Suture removal (26 or more) []  - 0 Hypo / Hyperglycemic Management (close monitor of Blood Glucose) []  - 0 Ankle / Brachial Index (ABI) - do not check if billed separately []  - 0 Vital Signs [name], [name] ([id number]) Has the patient been seen at the hospital within the last three years: Yes Total Score: 0 Level Of Care: ____ Electronic Signature(s) Signed: 11/15/2018 4:10:42 PM By: Harold Barban Entered By: Harold Barban on 11/15/2018 09:31:21 [name] ([id number]) -------------------------------------------------------------------------------- Encounter Discharge Information Details Patient Name: [name] Date of Service: 11/15/2018 9:00 AM Medical Record Number: [id number] Patient Account Number: [id number] Date of Birth/Sex: 1950-08-29 (68 y.o. M) Treating RN: Harold Barban Primary Care Navid Lenzen: [name] Other Clinician: Referring Takao Lizer: [name] Treating Bryonna Sundby/Extender: [name], HOYT Weeks in Treatment: 50 Encounter Discharge Information Items Post Procedure Vitals Discharge Condition: Stable Temperature (F): 98.2 Ambulatory Status: Wheelchair Pulse (bpm): 60 Discharge Destination: Home Respiratory Rate (breaths/min): 16 Transportation: Private Auto Blood Pressure (mmHg): 130/65 Accompanied By: wife Schedule Follow-up Appointment: Yes Clinical Summary of Care: Electronic Signature(s) Signed: 11/15/2018 4:10:42 PM By: Harold Barban Entered By: Harold Barban on 11/15/2018 09:33:34 [name] ([id number]) -------------------------------------------------------------------------------- Lower Extremity Assessment Details Patient Name: [name] Date of Service: 11/15/2018 9:00 AM Medical Record Number: [id number] Patient Account Number: [id number] Date of Birth/Sex: 07/20/1951 (68 y.o. M) Treating RN: Montey Hora Primary Care Naksh Radi: [name] Other Clinician: Referring Zailah Zagami: [name] Treating Marysue Fait/Extender: [name], HOYT Weeks in Treatment: 50 Electronic Signature(s) Signed: 11/15/2018 4:13:42 PM By: Montey Hora Entered By: Montey Hora on 11/15/2018 09:12:05 [name], [name] ([id number]) -------------------------------------------------------------------------------- Multi Wound Chart Details Patient Name: [name] Date of Service: 11/15/2018 9:00 AM Medical Record Number: [id number] Patient Account Number: [id number] Date of Birth/Sex: October 22, 1950 (68 y.o. M) Treating RN: Harold Barban Primary Care Parissa Chiao: [name] Other Clinician: Referring Cambren Helm: [name] Treating Jansen Goodpasture/Extender: [name], HOYT Weeks in Treatment: 50 Vital Signs Height(in): 73 Pulse(bpm): 60 Weight(lbs): 210 Blood Pressure(mmHg):  130/65 Body Mass Index(BMI): 28 Temperature(F): 98.2 Respiratory Rate 16 (breaths/min): Photos: [N/A:N/A] Wound Location: Right Gluteal fold N/A N/A Wounding Event: Pressure Injury N/A N/A Primary Etiology: Pressure Ulcer N/A N/A Comorbid History: Cataracts, Hypertension, N/A N/A Paraplegia Date Acquired: 11/02/2017 N/A N/A Weeks of Treatment: 50 N/A N/A Wound Status: Open N/A N/A Measurements L x W x D 2.7x2.1x3.5 N/A N/A (cm) Area (cm) : 4.453 N/A N/A Volume (cm) : 15.586 N/A N/A % Reduction in Area: 55.70% N/A N/A % Reduction in Volume: -1450.80% N/A N/A Position 1 (o'clock): 12 Maximum Distance 1 (cm): 4.3 Tunneling: Yes  N/A N/A Classification: Category/Stage IV N/A N/A Exudate Amount: Medium N/A N/A Exudate Type: Serous N/A N/A Exudate Color: amber N/A N/A Wound Margin: Epibole N/A N/A Granulation Amount: Large (67-100%) N/A N/A Granulation Quality: Red, Pink, Hyper-granulation N/A N/A Necrotic Amount: Small (1-33%) N/A N/A Exposed Structures: Fat Layer (Subcutaneous N/A N/A Tissue) Exposed: Yes Fascia: No Tendon: No [name], [name]. ([id number]) Muscle: No Joint: No Bone: No Epithelialization: None N/A N/A Periwound Skin Texture: Rash: Yes N/A N/A Excoriation: No Induration: No Callus: No Crepitus: No Scarring: No Periwound Skin Moisture: Maceration: Yes N/A N/A Dry/Scaly: No Periwound Skin Color: Atrophie Blanche: No N/A N/A Cyanosis: No Ecchymosis: No Erythema: No Hemosiderin Staining: No Mottled: No Pallor: No Rubor: No Temperature: No Abnormality N/A N/A Tenderness on Palpation: Yes N/A N/A Treatment Notes Electronic Signature(s) Signed: 11/15/2018 4:10:42 PM By: Harold Barban Entered By: Harold Barban on 11/15/2018 09:24:26 [name] ([id number]) -------------------------------------------------------------------------------- [place] Details Patient Name: [name] Date of Service:  11/15/2018 9:00 AM Medical Record Number: [id number] Patient Account Number: [id number] Date of Birth/Sex: [birth year] (68 y.o. M) Treating RN: Harold Barban Primary Care Vonzella Althaus: [name] Other Clinician: Referring Brendaly Townsel: [name] Treating Shanel Prazak/Extender: [name], HOYT Weeks in Treatment: 86 Active Inactive Orientation to the Wound Care Program Nursing Diagnoses: Knowledge deficit related to the wound healing center program Goals: Patient/caregiver will verbalize understanding of the [facility] Program Date Initiated: 11/30/2017 Target Resolution Date: 12/21/2017 Goal Status: Active Interventions: Provide education on orientation to the wound center Notes: Pressure Nursing Diagnoses: Knowledge deficit related to management of pressures ulcers Goals: Patient/caregiver will verbalize understanding of pressure ulcer management Date Initiated: 05/17/2018 Target Resolution Date: 05/28/2018 Goal Status: Active Interventions: Assess: immobility, friction, shearing, incontinence upon admission and as needed Notes: Wound/Skin Impairment Nursing Diagnoses: Impaired tissue integrity Goals: Patient/caregiver will verbalize understanding of skin care regimen Date Initiated: 11/30/2017 Target Resolution Date: 12/21/2017 Goal Status: Active Ulcer/skin breakdown will have a volume reduction of 30% by week 4 Date Initiated: 11/30/2017 Target Resolution Date: 12/21/2017 Goal Status: Active [name], [name] ([id number]) Interventions: Assess patient/caregiver ability to obtain necessary supplies Assess patient/caregiver ability to perform ulcer/skin care regimen upon admission and as needed Assess ulceration(s) every visit Treatment Activities: Skin care regimen initiated : 11/30/2017 Notes: Electronic Signature(s) Signed: 11/15/2018 4:10:42 PM By: Harold Barban Entered By: Harold Barban on 11/15/2018 09:24:15 [name]  ([id number]) -------------------------------------------------------------------------------- Pain Assessment Details Patient Name: [name] Date of Service: 11/15/2018 9:00 AM Medical Record Number: [id number] Patient Account Number: [id number] Date of Birth/Sex: [birth year] (68 y.o. M) Treating RN: Montey Hora Primary Care Jamarquis Crull: [name] Other Clinician: Referring Santino Kinsella: [name] Treating Deakon Frix/Extender: [name], HOYT Weeks in Treatment: 50 Active Problems Location of Pain Severity and Description of Pain Patient Has Paino No Site Locations Pain Management and Medication Current Pain Management: Electronic Signature(s) Signed: 11/15/2018 4:13:42 PM By: Montey Hora Entered By: Montey Hora on 11/15/2018 09:03:53 [name] ([id number]) -------------------------------------------------------------------------------- Patient/Caregiver Education Details Patient Name: [name] Date of Service: 11/15/2018 9:00 AM Medical Record Number: [id number] Patient Account Number: [id number] Date of Birth/Gender: 14-Nov-1950 (68 y.o. M) Treating RN: Harold Barban Primary Care Physician: [name] Other Clinician: Referring Physician: [name] Treating Physician/Extender: Sharalyn Ink in Treatment: 22 Education Assessment Education Provided To: Patient Education Topics Provided Pressure: Handouts: Pressure Ulcers: Care and Offloading Methods: Demonstration, Explain/Verbal Responses: State content correctly Wound/Skin Impairment: Handouts: Caring for Your Ulcer Methods: Demonstration, Explain/Verbal Responses: State content correctly Electronic  Signature(s) Signed: 11/15/2018 4:10:42 PM By: Harold Barban Entered By: Harold Barban on 11/15/2018 09:25:02 [name] ([id number]) -------------------------------------------------------------------------------- Wound Assessment  Details Patient Name: [name] Date of Service: 11/15/2018 9:00 AM Medical Record Number: [id number] Patient Account Number: [id number] Date of Birth/Sex: [birth year] (68 y.o. M) Treating RN: Montey Hora Primary Care Ralph Benavidez: [name] Other Clinician: Referring Navin Dogan: [name] Treating Olla Delancey/Extender: [name], HOYT Weeks in Treatment: 50 Wound Status Wound Number: 1 Primary Etiology: Pressure Ulcer Wound Location: Right Gluteal fold Wound Status: Open Wounding Event: Pressure Injury Comorbid History: Cataracts, Hypertension, Paraplegia Date Acquired: 11/02/2017 Weeks Of Treatment: 50 Clustered Wound: No Photos Wound Measurements Length: (cm) 2.7 Width: (cm) 2.1 Depth: (cm) 3.5 Area: (cm) 4.453 Volume: (cm) 15.586 % Reduction in Area: 55.7% % Reduction in Volume: -1450.8% Epithelialization: None Tunneling: Yes Position (o'clock): 12 Maximum Distance: (cm) 4.3 Undermining: No Wound Description Classification: Category/Stage IV Wound Margin: Epibole Exudate Amount: Medium Exudate Type: Serous Exudate Color: amber Foul Odor After Cleansing: No Slough/Fibrino Yes Wound Bed Granulation Amount: Large (67-100%) Exposed Structure Granulation Quality: Red, Pink, Hyper-granulation Fascia Exposed: No Necrotic Amount: Small (1-33%) Fat Layer (Subcutaneous Tissue) Exposed: Yes Necrotic Quality: Adherent Slough Tendon Exposed: No Muscle Exposed: No Joint Exposed: No Bone Exposed: No [name], [name]. ([id number]) Periwound Skin Texture Texture Color No Abnormalities Noted: No No Abnormalities Noted: No Callus: No Atrophie Blanche: No Crepitus: No Cyanosis: No Excoriation: No Ecchymosis: No Induration: No Erythema: No Rash: Yes Hemosiderin Staining: No Scarring: No Mottled: No Pallor: No Moisture Rubor: No No Abnormalities Noted: No Dry / Scaly: No Temperature / Pain Maceration: Yes Temperature: No  Abnormality Tenderness on Palpation: Yes Treatment Notes Wound #1 (Right Gluteal fold) Notes dakins wet to dry, bordered foam dressing; SNF nurses to reapply NPWT Electronic Signature(s) Signed: 11/15/2018 4:13:42 PM By: Montey Hora Entered By: Montey Hora on 11/15/2018 09:15:14 [name] ([id number]) -------------------------------------------------------------------------------- [place] Details Patient Name: [name] Date of Service: 11/15/2018 9:00 AM Medical Record Number: [id number] Patient Account Number: [id number] Date of Birth/Sex: 1950/10/05 (68 y.o. M) Treating RN: Montey Hora Primary Care Lenette Rau: [name] Other Clinician: Referring Rilla Buckman: [name] Treating Cameshia Cressman/Extender: [name], HOYT Weeks in Treatment: 50 Vital Signs Time Taken: 09:03 Temperature (F): 98.2 Height (in): 73 Pulse (bpm): 60 Weight (lbs): 210 Respiratory Rate (breaths/min): 16 Body Mass Index (BMI): 27.7 Blood Pressure (mmHg): 130/65 Reference Range: 80 - 120 mg / dl Electronic Signature(s) Signed: 11/15/2018 4:13:42 PM By: Montey Hora Entered By: Montey Hora on 11/15/2018 09:04:13

## 2018-11-15 NOTE — Progress Notes (Signed)
KAREN, KINNARD ([id number]) Visit Report for 11/15/2018 Chief Complaint Document Details Patient Name: [name] Date of Service: 11/15/2018 9:00 AM Medical Record Number: [id number] Patient Account Number: [id number] Date of Birth/Sex: 13-Nov-1950 (68 y.o. M) Treating RN: Harold Barban Primary Care Provider: [name] Other Clinician: Referring Provider: [name] Treating Provider/Extender: [name], HOYT Weeks in Treatment: 43 Information Obtained from: Patient Chief Complaint Right gluteal fold ulcer Electronic Signature(s) Signed: 11/15/2018 5:07:11 PM By: Worthy Keeler [name] Entered By: Worthy Keeler on 11/15/2018 09:23:02 HAVOC, SANLUIS ([id number]) -------------------------------------------------------------------------------- Debridement Details Patient Name: [name] Date of Service: 11/15/2018 9:00 AM Medical Record Number: [id number] Patient Account Number: [id number] Date of Birth/Sex: May 30, 1951 (68 y.o. M) Treating RN: Harold Barban Primary Care Provider: [name] Other Clinician: Referring Provider: [name] Treating Provider/Extender: [name], HOYT Weeks in Treatment: 50 Debridement Performed for Wound #1 Right Gluteal fold Assessment: Performed By: Physician [name], HOYT E., [name] Debridement Type: Debridement Level of Consciousness (Pre- Awake and Alert procedure): Pre-procedure Verification/Time Yes - 09:28 Out Taken: Start Time: 09:28 Pain Control: Lidocaine Total Area Debrided (L x W): 2.7 (cm) x 2.1 (cm) = 5.67 (cm) Tissue and other material Viable, Slough, Subcutaneous, Slough debrided: Level: Skin/Subcutaneous Tissue Debridement Description: Excisional Instrument: Curette Bleeding: Moderate Hemostasis Achieved: Silver Nitrate End Time: 09:33 Procedural Pain: 0 Post Procedural Pain: 0 Response to Treatment: Procedure was tolerated well Level of Consciousness Awake  and Alert (Post-procedure): Post Debridement Measurements of Total Wound Length: (cm) 2.7 Stage: Category/Stage IV Width: (cm) 2.1 Depth: (cm) 3.5 Volume: (cm) 15.586 Character of Wound/Ulcer Post Improved Debridement: Post Procedure Diagnosis Same as Pre-procedure Electronic Signature(s) Signed: 11/15/2018 4:10:42 PM By: Harold Barban Signed: 11/15/2018 5:07:11 PM By: Worthy Keeler [name] Entered By: Harold Barban on 11/15/2018 09:31:48 EULON, ALLNUTT ([id number]) -------------------------------------------------------------------------------- HPI Details Patient Name: [name] Date of Service: 11/15/2018 9:00 AM Medical Record Number: [id number] Patient Account Number: [id number] Date of Birth/Sex: 1950/07/23 (68 y.o. 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 He states that there is no history of diabetes that he is aware of. He does have issues with his prostate and is currently receiving treatment for this by way of oral medication. With that being said I do not have a lot of details in that regard. Nonetheless the patient presents today as a result of having been referred to [place] by another provider initially home health was set to come out and take care of his wound although due to the fact that he apparently drives he's not able to receive home health. His wife is therefore trying to help take care of this wound within although they have been struggling with what exactly to do at this point. She states that she  can do some things but she is definitely not a nurse and does have some issues with looking at blood. The good news is the wound does not appear to be too deep and is fairly superficial at this point. There is no slough noted there is some nonviable skin noted around the surface of the wound  and the perimeter at this point. The central portion of the wound appears to be very good with a dermal layer noted this does not appear to be again deep enough to extend it to subcutaneous tissue at this point. 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 In the pump he has Dilaudid and baclofen. 12/07/17 on evaluation today patient presents for follow-up concerning his ongoing lower back thigh ulcer on the right. He states that he did not get the supplies ordered and therefore has not really been able to perform the dressing changes as directed exactly. His wife was able to get some Boarder Foam Dressing's from the drugstore and subsequently has been using hydrogel which did help to a degree in the wound does appear to be able smaller. There is actually more drainage this week noted than previous. 12/21/17 on evaluation today patient appears to be doing rather well in regard to his right gluteal ulcer. He has been tolerating the dressing changes without complication. There does not appear to be any evidence of infection at this point in time. Overall the wound does seem to be making some progress as far as the edges are concerned there's not as much in the way of overlapping of the external wound edges and he has a good epithelium to wound bed border for the most part. This however is not true right at the 12 o'clock location over the span of a little over a centimeters which actually will require debridement today to clean this away and hopefully allow it to continue to heal more appropriately. 12/28/17 on  evaluation today patient appears to be doing rather well in regard to his ulcer in the left gluteal region. He's been tolerating the dressing changes without complication. Apparently he has had some difficulty getting his dressing material. Apparently there's been some confusion with ordering we're gonna check into this. Nonetheless overall he's been showing signs of improvement which is good news. Debridement is not required today. 01/04/18 on evaluation today patient presents for follow-up concerning his right gluteal ulcer. He has been tolerating the dressing changes fairly well. On inspection today it appears he may actually have some maceration them concerned about the fact that he may be developing too much moisture in and around the wound bed which can cause delay in healing. With that being said he unfortunately really has not showed significant signs of improvement since last week's evaluation in fact this may even be just the little bit/slightly larger. Nonetheless he's been having a lot of discomfort I'm not sure this is even related to the wound as he has no pain when I'm to breeding or otherwise cleaning the wound during evaluation today. Nonetheless this is something that we did recommend he talked to his pain specialist concerning. 01/11/18 on evaluation today patient appears to be doing better in regard to his ulceration. He has been tolerating the dressing changes without complication. With that being said overall there's no evidence of infection which is good news. The only thing is he did receive the hatch affair blue classic versus the ready nonetheless I feel like this is perfectly fine and appears to have done well for him over the past week. 01/25/18 on evaluation today patient's wound actually appears to be a little bit larger than during the last evaluation. The good BREVYN, RING. ([id number]) news is the majority of the wound edges  actually appear to be fairly firmly  attached to the wound bed unfortunately again we're not really making progress in regard to the size. Roughly the wound is about the same size as when I first saw him although again the wound margin/edges appear to be much better. 02/01/18 on evaluation today patient actually appears to be doing very well in regard to his wound. Applying the Prisma dry does seem to be better although he does still have issues with slow progression of the wound. There was a slight improvement compared to last week's measurements today. Nonetheless I have been considering other options as far as the possibility of Theraskin or even a snap vac. In general I'm not sure that the Theraskin due to location of the wound would be a very good idea. Nonetheless I do think that a snap vac could be a possibility for the patient and in fact I think this could even be an excellent way to manage the wound possibly seeing some improvement in a very rapid fashion here. Nonetheless this is something that we would need to get approved and I did have a lengthy conversation with the patient about this today. 02/08/18 on evaluation today patient appears to be doing a little better in regard to his ulcer. He has been tolerating the dressing changes without complication. Fortunately despite the fact that the wound is a little bit smaller it's not significantly so unfortunately. We have discussed the possibility of a snap vac we did check with insurance this is actually covered at this point. Fortunately there does not appear to be any sign of infection. Overall I'm fairly pleased with how things seem to be appearing at this point. 02/15/18 on evaluation today patient appears to be doing rather well in regard to his right gluteal ulcer. Unfortunately the snap vac did not stay in place with his sheer and friction this came loose and did not seem to maintain seal very well. He worked for about two days and it did seem to do very well during that  time according to his wife but in general this does not seem to be something that's gonna be beneficial for him long-term. I do believe we need to go back to standard dressings to see if we can find something that will be of benefit. 03/02/18- He is here in follow up evaluation; there is minimal change in the wound. He will continue with the same treatment plan, would consider changing to iodosrob/iodoflex if ulcer continues to to plateau. He will follow up next week 03/08/18 on evaluation today patient's wound actually appears to be about the same size as when I previously saw him several weeks back. Unfortunately he does have some slightly dark discoloration in the central portion of the wound which has me concerned about pressure injury. I do believe he may be sitting for too long a period of time in fact he tells me that "I probably sit for much too long". He does have some Slough noted on the surface of the wound and again as far as the size of the wound is concerned I'm really not seeing anything that seems to have improved significantly. 03/15/18 on evaluation today patient appears to be doing fairly well in regard to his ulcer. The wound measured pretty much about the same today compared to last week's evaluation when looking at his graph. With that being said the area of bruising/deep tissue injury that was noted last week I do not see at this point.  He did get a new cushion fortunately this does seem to be have been of benefit in my pinion. It does appear that he's been off of this more which is good news as well I think that is definitely showing in the overall wound measurements. With that being said I do believe that he needs to continue to offload I don't think that the fact this is doing better should be or is going to allow him to not have to offload and explain this to him as well. Overall he seems to be in agreement the plan I think he understands. The overall appearance of the wound  bed is improved compared to last week I think the Iodoflex has been beneficial in that regard. 03/29/18 on evaluation today patient actually appears to be doing rather well in regard to his wound from the overall appearance standpoint he does have some granulation although there's some Slough on the surface of the wound noted as well. With that being said he unfortunately has not improved in regard to the overall measurement of the wound in volume or in size. I did have a discussion with him very specifically about offloading today. He actually does work although he mainly is just sitting throughout the day. He tells me he offloads by "lifting himself up for 30 seconds off of his chair occasionally" purchase from [employer] which does seem to have helped. And he has a new cushion that he with that being said he's also able to stand some for a very short period of time but not significant enough I think to provide appropriate offloading. I think the biggest issue at this point with the wound and the fact is not healing as quickly as we would like is due to the fact that he is really not able to appropriately offload while at work. He states the beginning after his injury he actually had a bed at his job that he could lay on in order to offload and that does seem to have been of help back at that time. Nonetheless he had not done this in quite some time unfortunately. I think that could be helpful for him this is something I would like for him to look into. 04/05/18 on evaluation today patient actually presents for follow-up concerning his right gluteal ulcer. Again he really is not significantly improved even compared to last week. He has been tolerating the dressing changes without complication. With that being said fortunately there appears to be no evidence of infection at this time. He has been more proactive in trying to offload. [name], [name] ([id number]) 04/12/18 on evaluation today  patient actually appears to be doing a little better in regard to his wound and the right gluteal fold region. He's been tolerating the dressing changes since removing the oasis without complication. However he was having a lot of burning initially with the oasis in place. He's unsure of exactly why this was given so much discomfort but he assumes that it was the oasis itself causing the problem. Nonetheless this had to be removed after about three days in place although even those three days seem to have made a fairly good improvement in regard to the overall appearance of the wound bed. In fact is the first time that he's made any improvement from the standpoint of measurements in about six weeks. He continues to have no discomfort over the area of the wound itself which leads me to wonder why he was having the  burning with the oasis when he does not even feel the actual debridement's themselves. I am somewhat perplexed by this. 04/19/18 on evaluation today patient's wound actually appears to be showing signs of epithelialization around the edge of the wound and in general actually appears to be doing better which is good news. He did have the same burning after about three days with applying the Endoform last week in the same fashion that I would generally apply a skin substitute. This seems to indicate that it's not the oasis to cause the problem but potentially the moisture buildup that just causes things to burn or there may be some other reaction with the skin prep or Steri-Strips. Nonetheless I'm not sure that is gonna be able to tolerate any skin substitute for a long period of time. The good news is the wound actually appears to be doing better today compared to last week and does seem to finally be making some progress. 04/26/18 on evaluation today patient actually appears to be doing rather well in regard to his ulcer in the right gluteal fold. He has been tolerating the dressing changes  without complication which is good news. The Endoform does seem to be helping although he was a little bit more macerated this week. This seems to be an ongoing issue with fluid control at this point. Nonetheless I think we may be able to add something like Drawtex to help control the drainage. 05/03/18 on evaluation today patient appears to actually be doing better in regard to the overall appearance of his wound. He has been tolerating the dressing changes without complication. Fortunately there appears to be no evidence of infection at this time. I really feel like his wound has shown signs as of today of turning around last week I thought so as well and definitely he could be seen in this week's overall appearance and measurements. In general I'm very pleased with the fact that he finally seems to be making a steady but sure progress. The patient likewise is very pleased. 05/17/18 on evaluation today patient appears to be doing more poorly unfortunately in regard to his ulcer. He has been tolerating the dressing changes without complication. With that being said he tells me that in the past couple of days he and his wife have noticed that we did not seem to be doing quite as well is getting dark near the center. Subsequently upon evaluation today the wound actually does appear to be doing worse compared to previous. He has been tolerating the dressing changes otherwise and he states that he is not been sitting up anymore than he was in the past from what he tells me. Still he has continued to work he states "I'm tired of dealing with this and if I have to just go home and lay in the bed all the time that's what I'll do". Nonetheless I am concerned about the fact that this wound does appear to be deeper than what it was previous. 05/24/18 upon evaluation today patient actually presents after having been in the hospital due to what was presumed to be sepsis secondary to the wound infection. He had  an elevated white blood cell count between 14 and 15. With that being said he does seem to be doing somewhat better now. His wound still is giving him some trouble nonetheless and he is obviously concerned about the fact likely talked about that this does seem to go more deeply than previously noted. I did review his wound  culture which showed evidence of Staphylococcus aureus him and group B strep. Nonetheless he is on antibiotics, Levaquin, for this. Subsequently I did review his intake summary from the hospital as well. I also did look at the CT of the lumbar spine with contrast that was performed which showed no bone destruction to suggest lumbar disguises/osteomyelitis or sacral osteomyelitis. There was no paraspinal abscess. Nonetheless it appears this may have been more of just a soft tissue infection at this point which is good news. He still is nonetheless concerned about the wound which again I think is completely reasonable considering everything he's been through recently. 05/31/18 on evaluation today on evaluation today patient actually appears to be showing signs of his wound be a little bit deeper than what I would like to see. Fortunately he does not show any signs of significant infection although his temperature was 99 today he states he's been checking this at home and has not been elevated. Nonetheless with the undermining that I'm seeing at this point I am becoming more concerned about the wound I do think that offloading is a key factor here that is preventing the speedy recovery at this point. There does not appear to be any evidence of again over infection noted. He's been using Santyl currently. 06/07/18 the patient presents today for follow-up evaluation regarding the left ulcer in the gluteal region. He has been tolerating the Wound VAC fairly well. He is obviously very frustrated with this he states that to mean is really getting in his way. There does not appear to be  any evidence of infection at this time he does have a little bit of odor I do not necessarily associate this with infection just something that we sometimes notice with Wound VAC therapy. With that being said I can definitely catch a tone of discontentment overall in the patient's demeanor today. This when he was previously in the hospital KARIEM, WOLFSON. ([id number]) an CT scan was done of the lumbar region which did not reveal any signs of osteomyelitis. With that being said the pelvis in particular was not evaluated distinctly which means he could still have some osteonecrosis I. Nonetheless the Wound VAC was started on Thursday I do want to get this little bit more time before jumping to a CT scan of the pelvis although that is something that I might would recommend if were not see an improvement by that time. 06/14/18 on evaluation today patient actually appears to be doing about the same in regard to his right gluteal ulcer. Again he did have a CT scan of the lumbar spine unfortunately this did not include the pelvis. Nonetheless with the depth of the wound that I'm seeing today even despite the fact that I'm not seeing any evidence of overt cellulitis I believe there's a good chance that we may be dealing with osteomyelitis somewhere in the right Ischial region. No fevers, chills, nausea, or vomiting noted at this time. 06/21/18 on evaluation today patient actually appears to be doing about the same with regard to his wound. The tunnel at 6 o'clock really does not appear to be any deeper although it is a little bit wider. I think at this point you may want to start packing this with white phone. Unfortunately I have not got approval for the CT scan of the pelvis as of yet due to the fact that Medicare apparently has been denied it due to the diagnosis codes not being appropriate according to Medicare for the  test requested. With that being said the patient cannot have an MRI and therefore  this is the only option that we have as far as testing is concerned. The patient has had infection and was on antibiotics and been added code for cellulitis of the bottom to see if this will be appropriate for getting the test approved. Nonetheless I'm concerned about the infection have been spread deeper into the Ischial region. 06/28/18 on evaluation today patient actually appears to be doing rather well all things considered in regard to the right gluteal ulcer. He has been tolerating the dressing changes without complication. With that being said the Wound VAC he states does have to be replaced almost every day or at least reinforced unfortunately. Patient actually has his CT scan later this morning we should have the results by tomorrow. 07/05/18 on evaluation today patient presents for follow-up concerning his right Ischial ulcer. He did see the surgeon Dr. Lysle Pearl last week. They were actually very happy with him and felt like he spent a tremendous amount of time with them as far as discussing his situation was concerned. In the end Dr. Lysle Pearl did contact me as well and determine that he would not recommend any surgical intervention at this point as he felt like it would not be in the patient's best interest based on what he was seeing. He recommended a referral to infectious disease. Subsequently this is something that Dr. Ines Bloomer office is working on setting up for the patient. As far as evaluation today is concerned the patient's wound actually appears to be worse at this point. I am concerned about how things are progressing and specifically about infection. I do not feel like it's the deeper but the area of depth is definitely widening which does have me concerned. No fevers, chills, nausea, or vomiting noted at this time. I think that we do need initiate antibiotic therapy the patient has an allow allergy to amoxicillin/penicillin he states that he gets a rash since childhood. Nonetheless  she's never had the issues with Catholics or cephalosporins in general but he is aware of. 07/27/18 on evaluation today patient presents following admission to the hospital on 07/09/18. He was subsequently discharged on 07/20/18. On 07/15/18 the patient underwent irrigation and debridement was soft tissue biopsy and bone biopsy as well as placement of a Wound VAC in the OR by Dr. Celine Ahr. During the hospital course the patient was placed on a Wound VAC and recommended follow up with surgery in three weeks actually with Dr. Delaine Lame who is infectious disease. The patient was on vancomycin during the hospital course. 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 He also had a bone culture which revealed evidence of methicillin-resistant staph aureus. He is updated CT scan 07/09/18 reveals that he had progression of the which was performed on wound to breakdown down to the trochanter where he actually had irregularities there as well suggestive of osteomyelitis. This was a change just since 9 December when we last performed a CT scan. Obviously this one had gone downhill quite significantly and rapidly. At this point upon evaluation I feel like in general the patient's wound seems to be doing fairly well all things considered upon my evaluation today. Obviously this is larger and deeper than what I previously evaluated but at the same time he seems to be making some progress as far as the appearance of the granulation tissue is concerned. I'm happy in that regard. No fevers, chills,  nausea, or vomiting noted at this time. He is on IV vancomycin and Rocephin at the facility. He is currently in [facility]. 08/03/18 upon evaluation today patient's wound appears to be doing better in regard to the overall appearance at this point in time. Fortunately he's been tolerating the Wound VAC without complication and states that the facility has been taking excellent care of  the wound site. Overall I see some Slough noted on the surface which I am going to attempt sharp debridement today of but nonetheless other than this I feel like he's making progress. 08/09/18 on evaluation today patient's wound appears to be doing much better compared to even last week's evaluation. Do believe that the Wound VAC is been of great benefit for him. He has been tolerating the dressing changes that is the Wound JOSON, SAPP. ([id number]) VAC without any complication and he has excellent granulation noted currently. There is no need for sharp debridement at this point. 08/16/18 on evaluation today patient actually appears to be doing very well in regard to the wound in the right gluteal fold region. This is showing signs of progress and again appears to be very healthy which is excellent news. Fortunately there is no sign of active infection by way of odor or drainage at this point. Overall I'm very pleased with how things stand. He seems to be tolerating the Wound VAC without complication. 08/23/18 on evaluation today patient actually appears to be doing better in regard to his wound. He has been tolerating the Wound VAC without complication and in fact it has been collecting a significant amount of drainage which I think is good news especially considering how the wound appears. Fortunately there is no signs of infection at this time definitely nothing appears to be worse which is good news. He has not been started on the Bactrim and Flagyl that was recommended by Dr. Delaine Lame yet. I did actually contact her office this morning in order to check and see were things are that regard their gonna be calling me back. 08/30/18 on evaluation today patient actually appears to show signs of excellent improvement today compared to last evaluation. The undermining is getting much better the wound seems to be feeling quite nicely and I'm very pleased that the granulation in general. With  that being said overall I feel like the patient has made excellent progress which is great news. No fevers, chills, nausea, or vomiting noted at this time. 09/06/18 on evaluation today patient actually appears to be doing rather well in regard to his right gluteal ulcer. This is showing signs of improvement in overall I'm very pleased with how things seem to be progressing. The patient likewise is please. Overall I see no evidence of infection he is about to complete his oral antibiotic regimen which is the end of the antibiotics for him in just about three days. 09/13/18 on evaluation today patient's right Ischial ulcer appears to be showing signs of continued improvement which is excellent news. He's been tolerating the dressing changes without complication. Fortunately there's no signs of infection and the wound that seems to be doing very well. 09/28/18 on evaluation today patient appears to be doing rather well in regard to his right Ischial ulcer. He's been tolerating the Wound VAC without complication he knows there's much less drainage than there used to be this obviously is not a bad thing in my pinion. There's no evidence of infection despite the fact is but nothing about it now for  several weeks. 10/04/18 on evaluation today patient appears to be doing better in regard to his right Ischial wound. He has been tolerating the Wound VAC without complication and I do believe that the silver nitrate last week was beneficial for him. Fortunately overall there's no evidence of active infection at this time which is great news. No fevers, chills, nausea, or vomiting noted at this time. 10/11/18 on evaluation today patient actually appears to be doing rather well in regard to his Ischial ulcer. He's been tolerating the Wound VAC still without complication I feel like this is doing a good job. No fevers, chills, nausea, or vomiting noted at this time. 11/01/18 on evaluation today patient presents after  having not been seen in our clinic for several weeks secondary to the fact that he was on evaluation today patient presents after having not been seen in our clinic for several weeks secondary to the fact that he was in a skilled nursing facility which was on lockdown currently due to the covert 19 national emergency. Subsequently he was discharged from the facility on this past Friday and subsequently made an appointment to come in to see yesterday. Fortunately there's no signs of active infection at this time which is good news and overall he does seem to have made progress since I last saw. Overall I feel like things are progressing quite nicely. The patient is having no pain. 11/08/18 on evaluation today patient appears to be doing okay in regard to his right gluteal ulcer. He has been utilizing a Wound VAC home health this changing this at this point since he's home from the skilled nursing facility. Fortunately there's no signs of obvious active infection at this time. Unfortunately though there's no obvious active infection he is having some maceration and his wife states that when the sheets of the Wound VAC office on Sunday when it broke seal that he ended up having significant issues with some smell as well there concerned about the possibility of infection. Fortunately there's No fevers, chills, nausea, or vomiting noted at this time. 11/15/18 on evaluation today patient actually appears to be doing well in regard to his right gluteal ulcer. He has been tolerating the dressing changes without complication. Specifically the Wound VAC has been utilized up to this point. Fortunately there's no signs of infection and overall I feel like he has made progress even since last week when I last saw him. I'm actually fairly happy with the overall appearance although he does seem to have somewhat of a hyper granular Dizon, Daevion J. ([id number]) overgrowth in the central portion of the wound which  I think may require some sharp debridement to try flatness out possibly utilizing chemical cauterization following. Electronic Signature(s) Signed: 11/15/2018 5:07:11 PM By: Worthy Keeler [name] Entered By: Worthy Keeler on 11/15/2018 10:25:18 KENTON, FORTIN ([id number]) -------------------------------------------------------------------------------- Physical Exam Details Patient Name: [name] Date of Service: 11/15/2018 9:00 AM Medical Record Number: [id number] Patient Account Number: [id number] Date of Birth/Sex: [birth year] (68 y.o. M) Treating RN: Harold Barban Primary Care Provider: [name] Other Clinician: Referring Provider: [name] Treating Provider/Extender: [name], HOYT Weeks in Treatment: 64 Constitutional Well-nourished and well-hydrated in no acute distress. Respiratory normal breathing without difficulty. Psychiatric this patient is able to make decisions and demonstrates good insight into disease process. Alert and Oriented x 3. pleasant and cooperative. Notes On evaluation today patient's wound overall appear to be doing fairly well although I did perform sharp debridement of the  central portion of the wound where he had a overgrowth of granular tissue which the patient tolerated without complication. Post debridement the wound bed actually appear to be doing much better there was some bleeding silver nitrate in the amount of one stick was utilized to chemically cauterize the area to stop the bleeding. Electronic Signature(s) Signed: 11/15/2018 5:07:11 PM By: Worthy Keeler [name] Entered By: Worthy Keeler on 11/15/2018 10:26:08 KORBY, RATAY ([id number]) -------------------------------------------------------------------------------- Physician Orders Details Patient Name: [name] Date of Service: 11/15/2018 9:00 AM Medical Record Number: [id number] Patient Account Number: [id number] Date of Birth/Sex:  1950/09/01 (68 y.o. 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 Primary Wound Dressing Wound #1 Right Gluteal fold o Dry Gauze - wet to dry in clinic until SNF restarts NPWT Secondary Dressing Wound #1 Right Gluteal fold o Boardered Foam Dressing - in clinic until SNF restarts NPWT Dressing Change Frequency Wound #1 Right Gluteal fold o Change Dressing Monday, Wednesday, Friday - and as needed Follow-up Appointments Wound #1 Right Gluteal fold o Return Appointment in 1 week. Off-Loading Wound #1 Right Gluteal fold o Mattress - Please continue air mattress at SNF o Turn and reposition every 2 hours Negative Pressure Wound Therapy Wound #1 Right Gluteal fold Doolan, Jujhar J. ([id number]) o Wound VAC settings at 125/130 mmHg continuous pressure. Use BLACK/GREEN foam to wound cavity. Use WHITE foam to fill any tunnel/s and/or undermining. Change VAC dressing 3 X WEEK. Change canister as indicated when full. Nurse may titrate settings and frequency of dressing changes as clinically indicated. - PLEASE, apply Silver Collagen to line the base of the wound. Black foam only! Thank you. o Home Health Nurse may d/c VAC for s/s of increased infection, significant wound regression, or  uncontrolled drainage. [place] at [phone]. o Number of foam/gauze pieces used in the dressing = Electronic Signature(s) Signed: 11/15/2018 4:10:42 PM By: Harold Barban Signed: 11/15/2018 5:07:11 PM By: Worthy Keeler [name] Entered By: Harold Barban on 11/15/2018 09:32:19 ROXIE, GUEYE ([id number]) -------------------------------------------------------------------------------- Problem List Details Patient Name: [name] Date of Service: 11/15/2018 9:00 AM Medical Record Number: [id number] Patient Account Number: [id number] Date of Birth/Sex: 08-15-1950 (68 y.o. M) Treating RN: Harold Barban Primary Care Provider: [name] Other Clinician: Referring Provider: [name] Treating Provider/Extender: [name], HOYT Weeks in Treatment: 50 Active Problems ICD-10 Evaluated Encounter Code Description Active Date Today Diagnosis L89.314 Pressure ulcer of right buttock, stage 4 11/30/2017 No Yes L03.317 Cellulitis of buttock 06/21/2018 No Yes G82.20 Paraplegia, unspecified 11/30/2017 No Yes S34.109S Unspecified injury to unspecified level of lumbar spinal cord, 11/30/2017 No Yes sequela I10 Essential (primary) hypertension 11/30/2017 No Yes Inactive Problems Resolved Problems Electronic Signature(s) Signed: 11/15/2018 5:07:11 PM By: Worthy Keeler [name] Entered By: Worthy Keeler on 11/15/2018 09:22:57 BELEN, ZWAHLEN ([id number]) -------------------------------------------------------------------------------- Progress Note Details Patient Name: [name] Date of Service: 11/15/2018 9:00 AM Medical  Record Number: [id number] Patient Account Number: [id number] Date of Birth/Sex: 1950-08-03 (68 y.o. 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 He states that there is no history of diabetes that he is aware of. He does have issues with his prostate and is currently receiving treatment for this by way of oral medication. With that being said I do not have a lot of details in that regard. Nonetheless the patient presents today as a result of having been referred to [place] by another provider initially home health was set to come out and take care of his wound although due to the fact that he apparently drives he's not able to receive home health. His wife is therefore trying to help take care of this wound within although they have been struggling with what exactly to do at this point. She states that she can do some things but she is definitely not a nurse and does have some issues with looking at blood. The good news is the wound does not appear to be too deep and is fairly superficial at this point. There is no slough noted there is some nonviable skin noted around the surface of the wound and the perimeter at this point. The central portion of the wound appears to be very good with a dermal layer noted this does not appear to be again deep enough to extend it to subcutaneous tissue at this point. 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 In the pump he has Dilaudid and baclofen. 12/07/17 on evaluation today patient presents for follow-up concerning his ongoing lower back thigh ulcer on the right. He states that he did not get the supplies ordered and therefore has not really been  able to perform the dressing changes as directed exactly. His wife was able to get some Boarder Foam Dressing's from the drugstore and subsequently has been using hydrogel which did help to a degree in the wound does appear to be able smaller. There is actually more drainage this week noted than previous. 12/21/17 on evaluation today patient appears to be doing rather well in regard to his right gluteal ulcer. He has been tolerating the dressing changes without complication. There does not appear to be any evidence of infection at this point in time. Overall the wound does seem to be making some progress as far as the edges are concerned there's not as much in the way of overlapping of the external wound edges and he has a good epithelium to wound bed border for the most part. This however is not true right at the 12 o'clock location over the span of a little over a centimeters which actually will require debridement today to clean this away and hopefully allow it to continue to heal more appropriately. 12/28/17 on evaluation today patient appears to be doing rather well in regard to his ulcer in the left gluteal region. He's been tolerating the dressing changes without complication.  Apparently he has had some difficulty getting his dressing material. Apparently there's been some confusion with ordering we're gonna check into this. Nonetheless overall he's been showing signs of improvement which is good news. Debridement is not required today. 01/04/18 on evaluation today patient presents for follow-up concerning his right gluteal ulcer. He has been tolerating the dressing changes fairly well. On inspection today it appears he may actually have some maceration them concerned about the fact that he may be developing too much moisture in and around the wound bed which can cause delay in healing. With that being said he unfortunately really has not showed significant signs of improvement since last week's  evaluation in fact this may even be just the little bit/slightly larger. Nonetheless he's been having a lot of discomfort I'm not sure this is even related to the wound as he has no pain when I'm to breeding or otherwise cleaning the wound during evaluation today. Nonetheless this is something that we did recommend he talked to his pain specialist concerning. 01/11/18 on evaluation today patient appears to be doing better in regard to his ulceration. He has been tolerating the dressing Girtman, Keyonte J. ([id number]) changes without complication. With that being said overall there's no evidence of infection which is good news. The only thing is he did receive the hatch affair blue classic versus the ready nonetheless I feel like this is perfectly fine and appears to have done well for him over the past week. 01/25/18 on evaluation today patient's wound actually appears to be a little bit larger than during the last evaluation. The good news is the majority of the wound edges actually appear to be fairly firmly attached to the wound bed unfortunately again we're not really making progress in regard to the size. Roughly the wound is about the same size as when I first saw him although again the wound margin/edges appear to be much better. 02/01/18 on evaluation today patient actually appears to be doing very well in regard to his wound. Applying the Prisma dry does seem to be better although he does still have issues with slow progression of the wound. There was a slight improvement compared to last week's measurements today. Nonetheless I have been considering other options as far as the possibility of Theraskin or even a snap vac. In general I'm not sure that the Theraskin due to location of the wound would be a very good idea. Nonetheless I do think that a snap vac could be a possibility for the patient and in fact I think this could even be an excellent way to manage the wound possibly seeing some  improvement in a very rapid fashion here. Nonetheless this is something that we would need to get approved and I did have a lengthy conversation with the patient about this today. 02/08/18 on evaluation today patient appears to be doing a little better in regard to his ulcer. He has been tolerating the dressing changes without complication. Fortunately despite the fact that the wound is a little bit smaller it's not significantly so unfortunately. We have discussed the possibility of a snap vac we did check with insurance this is actually covered at this point. Fortunately there does not appear to be any sign of infection. Overall I'm fairly pleased with how things seem to be appearing at this point. 02/15/18 on evaluation today patient appears to be doing rather well in regard to his right gluteal ulcer. Unfortunately the snap vac did not stay in place  with his sheer and friction this came loose and did not seem to maintain seal very well. He worked for about two days and it did seem to do very well during that time according to his wife but in general this does not seem to be something that's gonna be beneficial for him long-term. I do believe we need to go back to standard dressings to see if we can find something that will be of benefit. 03/02/18- He is here in follow up evaluation; there is minimal change in the wound. He will continue with the same treatment plan, would consider changing to iodosrob/iodoflex if ulcer continues to to plateau. He will follow up next week 03/08/18 on evaluation today patient's wound actually appears to be about the same size as when I previously saw him several weeks back. Unfortunately he does have some slightly dark discoloration in the central portion of the wound which has me concerned about pressure injury. I do believe he may be sitting for too long a period of time in fact he tells me that "I probably sit for much too long". He does have some Slough noted on  the surface of the wound and again as far as the size of the wound is concerned I'm really not seeing anything that seems to have improved significantly. 03/15/18 on evaluation today patient appears to be doing fairly well in regard to his ulcer. The wound measured pretty much about the same today compared to last week's evaluation when looking at his graph. With that being said the area of bruising/deep tissue injury that was noted last week I do not see at this point. He did get a new cushion fortunately this does seem to be have been of benefit in my pinion. It does appear that he's been off of this more which is good news as well I think that is definitely showing in the overall wound measurements. With that being said I do believe that he needs to continue to offload I don't think that the fact this is doing better should be or is going to allow him to not have to offload and explain this to him as well. Overall he seems to be in agreement the plan I think he understands. The overall appearance of the wound bed is improved compared to last week I think the Iodoflex has been beneficial in that regard. 03/29/18 on evaluation today patient actually appears to be doing rather well in regard to his wound from the overall appearance standpoint he does have some granulation although there's some Slough on the surface of the wound noted as well. With that being said he unfortunately has not improved in regard to the overall measurement of the wound in volume or in size. I did have a discussion with him very specifically about offloading today. He actually does work although he mainly is just sitting throughout the day. He tells me he offloads by "lifting himself up for 30 seconds off of his chair occasionally" purchase from [facility] which does seem to have helped. And he has a new cushion that he with that being said he's also able to stand some for a very short period of time but not significant  enough I think to provide appropriate offloading. I think the biggest issue at this point with the wound and the fact is not healing as quickly as we would like is due to the fact that he is really not able to appropriately offload while at work.  He states the beginning after his injury he actually had a bed at his job that he could lay on in order to offload and that does seem to have been of help back at that time. Nonetheless he had not done this in quite some time unfortunately. I think that could be helpful for him this is something I would like for him to look into. HESSTON, HITCHENS ([id number]) 04/05/18 on evaluation today patient actually presents for follow-up concerning his right gluteal ulcer. Again he really is not significantly improved even compared to last week. He has been tolerating the dressing changes without complication. With that being said fortunately there appears to be no evidence of infection at this time. He has been more proactive in trying to offload. 04/12/18 on evaluation today patient actually appears to be doing a little better in regard to his wound and the right gluteal fold region. He's been tolerating the dressing changes since removing the oasis without complication. However he was having a lot of burning initially with the oasis in place. He's unsure of exactly why this was given so much discomfort but he assumes that it was the oasis itself causing the problem. Nonetheless this had to be removed after about three days in place although even those three days seem to have made a fairly good improvement in regard to the overall appearance of the wound bed. In fact is the first time that he's made any improvement from the standpoint of measurements in about six weeks. He continues to have no discomfort over the area of the wound itself which leads me to wonder why he was having the burning with the oasis when he does not even feel the actual debridement's  themselves. I am somewhat perplexed by this. 04/19/18 on evaluation today patient's wound actually appears to be showing signs of epithelialization around the edge of the wound and in general actually appears to be doing better which is good news. He did have the same burning after about three days with applying the Endoform last week in the same fashion that I would generally apply a skin substitute. This seems to indicate that it's not the oasis to cause the problem but potentially the moisture buildup that just causes things to burn or there may be some other reaction with the skin prep or Steri-Strips. Nonetheless I'm not sure that is gonna be able to tolerate any skin substitute for a long period of time. The good news is the wound actually appears to be doing better today compared to last week and does seem to finally be making some progress. 04/26/18 on evaluation today patient actually appears to be doing rather well in regard to his ulcer in the right gluteal fold. He has been tolerating the dressing changes without complication which is good news. The Endoform does seem to be helping although he was a little bit more macerated this week. This seems to be an ongoing issue with fluid control at this point. Nonetheless I think we may be able to add something like Drawtex to help control the drainage. 05/03/18 on evaluation today patient appears to actually be doing better in regard to the overall appearance of his wound. He has been tolerating the dressing changes without complication. Fortunately there appears to be no evidence of infection at this time. I really feel like his wound has shown signs as of today of turning around last week I thought so as well and definitely he could be seen in this  week's overall appearance and measurements. In general I'm very pleased with the fact that he finally seems to be making a steady but sure progress. The patient likewise is very pleased. 05/17/18 on  evaluation today patient appears to be doing more poorly unfortunately in regard to his ulcer. He has been tolerating the dressing changes without complication. With that being said he tells me that in the past couple of days he and his wife have noticed that we did not seem to be doing quite as well is getting dark near the center. Subsequently upon evaluation today the wound actually does appear to be doing worse compared to previous. He has been tolerating the dressing changes otherwise and he states that he is not been sitting up anymore than he was in the past from what he tells me. Still he has continued to work he states "I'm tired of dealing with this and if I have to just go home and lay in the bed all the time that's what I'll do". Nonetheless I am concerned about the fact that this wound does appear to be deeper than what it was previous. 05/24/18 upon evaluation today patient actually presents after having been in the hospital due to what was presumed to be sepsis secondary to the wound infection. He had an elevated white blood cell count between 14 and 15. With that being said he does seem to be doing somewhat better now. His wound still is giving him some trouble nonetheless and he is obviously concerned about the fact likely talked about that this does seem to go more deeply than previously noted. I did review his wound culture which showed evidence of Staphylococcus aureus him and group B strep. Nonetheless he is on antibiotics, Levaquin, for this. Subsequently I did review his intake summary from the hospital as well. I also did look at the CT of the lumbar spine with contrast that was performed which showed no bone destruction to suggest lumbar disguises/osteomyelitis or sacral osteomyelitis. There was no paraspinal abscess. Nonetheless it appears this may have been more of just a soft tissue infection at this point which is good news. He still is nonetheless concerned about the  wound which again I think is completely reasonable considering everything he's been through recently. 05/31/18 on evaluation today on evaluation today patient actually appears to be showing signs of his wound be a little bit deeper than what I would like to see. Fortunately he does not show any signs of significant infection although his temperature was 99 today he states he's been checking this at home and has not been elevated. Nonetheless with the undermining that I'm seeing at this point I am becoming more concerned about the wound I do think that offloading is a key factor here that is preventing the speedy recovery at this point. There does not appear to be any evidence of again over infection noted. He's been using Santyl currently. JESSEN, SIEGMAN ([id number]) 06/07/18 the patient presents today for follow-up evaluation regarding the left ulcer in the gluteal region. He has been tolerating the Wound VAC fairly well. He is obviously very frustrated with this he states that to mean is really getting in his way. There does not appear to be any evidence of infection at this time he does have a little bit of odor I do not necessarily associate this with infection just something that we sometimes notice with Wound VAC therapy. With that being said I can definitely catch a tone of  discontentment overall in the patient's demeanor today. This when he was previously in the hospital an CT scan was done of the lumbar region which did not reveal any signs of osteomyelitis. With that being said the pelvis in particular was not evaluated distinctly which means he could still have some osteonecrosis I. Nonetheless the Wound VAC was started on Thursday I do want to get this little bit more time before jumping to a CT scan of the pelvis although that is something that I might would recommend if were not see an improvement by that time. 06/14/18 on evaluation today patient actually appears to be doing  about the same in regard to his right gluteal ulcer. Again he did have a CT scan of the lumbar spine unfortunately this did not include the pelvis. Nonetheless with the depth of the wound that I'm seeing today even despite the fact that I'm not seeing any evidence of overt cellulitis I believe there's a good chance that we may be dealing with osteomyelitis somewhere in the right Ischial region. No fevers, chills, nausea, or vomiting noted at this time. 06/21/18 on evaluation today patient actually appears to be doing about the same with regard to his wound. The tunnel at 6 o'clock really does not appear to be any deeper although it is a little bit wider. I think at this point you may want to start packing this with white phone. Unfortunately I have not got approval for the CT scan of the pelvis as of yet due to the fact that Medicare apparently has been denied it due to the diagnosis codes not being appropriate according to Medicare for the test requested. With that being said the patient cannot have an MRI and therefore this is the only option that we have as far as testing is concerned. The patient has had infection and was on antibiotics and been added code for cellulitis of the bottom to see if this will be appropriate for getting the test approved. Nonetheless I'm concerned about the infection have been spread deeper into the Ischial region. 06/28/18 on evaluation today patient actually appears to be doing rather well all things considered in regard to the right gluteal ulcer. He has been tolerating the dressing changes without complication. With that being said the Wound VAC he states does have to be replaced almost every day or at least reinforced unfortunately. Patient actually has his CT scan later this morning we should have the results by tomorrow. 07/05/18 on evaluation today patient presents for follow-up concerning his right Ischial ulcer. He did see the surgeon Dr. Lysle Pearl last week.  They were actually very happy with him and felt like he spent a tremendous amount of time with them as far as discussing his situation was concerned. In the end Dr. Lysle Pearl did contact me as well and determine that he would not recommend any surgical intervention at this point as he felt like it would not be in the patient's best interest based on what he was seeing. He recommended a referral to infectious disease. Subsequently this is something that Dr. Ines Bloomer office is working on setting up for the patient. As far as evaluation today is concerned the patient's wound actually appears to be worse at this point. I am concerned about how things are progressing and specifically about infection. I do not feel like it's the deeper but the area of depth is definitely widening which does have me concerned. No fevers, chills, nausea, or vomiting noted at this time.  I think that we do need initiate antibiotic therapy the patient has an allow allergy to amoxicillin/penicillin he states that he gets a rash since childhood. Nonetheless she's never had the issues with Catholics or cephalosporins in general but he is aware of. 07/27/18 on evaluation today patient presents following admission to the hospital on 07/09/18. He was subsequently discharged on 07/20/18. On 07/15/18 the patient underwent irrigation and debridement was soft tissue biopsy and bone biopsy as well as placement of a Wound VAC in the OR by Dr. Celine Ahr. During the hospital course the patient was placed on a Wound VAC and recommended follow up with surgery in three weeks actually with Dr. Delaine Lame who is infectious disease. The patient was on vancomycin during the hospital course. 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 He also had a bone culture which revealed evidence of methicillin-resistant staph aureus. He is updated CT scan 07/09/18 reveals that he had progression of the which was performed on wound to  breakdown down to the trochanter where he actually had irregularities there as well suggestive of osteomyelitis. This was a change just since 9 December when we last performed a CT scan. Obviously this one had gone downhill quite significantly and rapidly. At this point upon evaluation I feel like in general the patient's wound seems to be doing fairly well all things considered upon my evaluation today. Obviously this is larger and deeper than what I previously evaluated but at the same time he seems to be making some progress as far as the appearance of the granulation tissue is concerned. I'm happy in that regard. No fevers, chills, nausea, or vomiting noted at this time. He is on IV vancomycin and Rocephin at the facility. He is currently in [facility]. 08/03/18 upon evaluation today patient's wound appears to be doing better in regard to the overall appearance at this point in time. Fortunately he's been tolerating the Wound VAC without complication and states that the facility has been taking ANDYN, SALES. ([id number]) excellent care of the wound site. Overall I see some Slough noted on the surface which I am going to attempt sharp debridement today of but nonetheless other than this I feel like he's making progress. 08/09/18 on evaluation today patient's wound appears to be doing much better compared to even last week's evaluation. Do believe that the Wound VAC is been of great benefit for him. He has been tolerating the dressing changes that is the Wound VAC without any complication and he has excellent granulation noted currently. There is no need for sharp debridement at this point. 08/16/18 on evaluation today patient actually appears to be doing very well in regard to the wound in the right gluteal fold region. This is showing signs of progress and again appears to be very healthy which is excellent news. Fortunately there is no sign of active infection by way of odor or  drainage at this point. Overall I'm very pleased with how things stand. He seems to be tolerating the Wound VAC without complication. 08/23/18 on evaluation today patient actually appears to be doing better in regard to his wound. He has been tolerating the Wound VAC without complication and in fact it has been collecting a significant amount of drainage which I think is good news especially considering how the wound appears. Fortunately there is no signs of infection at this time definitely nothing appears to be worse which is good news. He has not been started on the  Bactrim and Flagyl that was recommended by Dr. Delaine Lame yet. I did actually contact her office this morning in order to check and see were things are that regard their gonna be calling me back. 08/30/18 on evaluation today patient actually appears to show signs of excellent improvement today compared to last evaluation. The undermining is getting much better the wound seems to be feeling quite nicely and I'm very pleased that the granulation in general. With that being said overall I feel like the patient has made excellent progress which is great news. No fevers, chills, nausea, or vomiting noted at this time. 09/06/18 on evaluation today patient actually appears to be doing rather well in regard to his right gluteal ulcer. This is showing signs of improvement in overall I'm very pleased with how things seem to be progressing. The patient likewise is please. Overall I see no evidence of infection he is about to complete his oral antibiotic regimen which is the end of the antibiotics for him in just about three days. 09/13/18 on evaluation today patient's right Ischial ulcer appears to be showing signs of continued improvement which is excellent news. He's been tolerating the dressing changes without complication. Fortunately there's no signs of infection and the wound that seems to be doing very well. 09/28/18 on evaluation today  patient appears to be doing rather well in regard to his right Ischial ulcer. He's been tolerating the Wound VAC without complication he knows there's much less drainage than there used to be this obviously is not a bad thing in my pinion. There's no evidence of infection despite the fact is but nothing about it now for several weeks. 10/04/18 on evaluation today patient appears to be doing better in regard to his right Ischial wound. He has been tolerating the Wound VAC without complication and I do believe that the silver nitrate last week was beneficial for him. Fortunately overall there's no evidence of active infection at this time which is great news. No fevers, chills, nausea, or vomiting noted at this time. 10/11/18 on evaluation today patient actually appears to be doing rather well in regard to his Ischial ulcer. He's been tolerating the Wound VAC still without complication I feel like this is doing a good job. No fevers, chills, nausea, or vomiting noted at this time. 11/01/18 on evaluation today patient presents after having not been seen in our clinic for several weeks secondary to the fact that he was on evaluation today patient presents after having not been seen in our clinic for several weeks secondary to the fact that he was in a skilled nursing facility which was on lockdown currently due to the covert 19 national emergency. Subsequently he was discharged from the facility on this past Friday and subsequently made an appointment to come in to see yesterday. Fortunately there's no signs of active infection at this time which is good news and overall he does seem to have made progress since I last saw. Overall I feel like things are progressing quite nicely. The patient is having no pain. 11/08/18 on evaluation today patient appears to be doing okay in regard to his right gluteal ulcer. He has been utilizing a Wound VAC home health this changing this at this point since he's home from  the skilled nursing facility. Fortunately there's no signs of obvious active infection at this time. Unfortunately though there's no obvious active infection he is having some maceration and his wife states that when the sheets of the Wound VAC  office on Sunday when it broke seal that he ended up having significant issues with some smell as well there concerned about the possibility of infection. Fortunately there's No fevers, chills, nausea, or vomiting noted at this time. SANTONIO, SPEAKMAN ([id number]) 11/15/18 on evaluation today patient actually appears to be doing well in regard to his right gluteal ulcer. He has been tolerating the dressing changes without complication. Specifically the Wound VAC has been utilized up to this point. Fortunately there's no signs of infection and overall I feel like he has made progress even since last week when I last saw him. I'm actually fairly happy with the overall appearance although he does seem to have somewhat of a hyper granular overgrowth in the central portion of the wound which I think may require some sharp debridement to try flatness out possibly utilizing chemical cauterization following. Patient History Information obtained from Patient. Family History Hypertension - Father, Stroke - Mother, No family history of Cancer, Diabetes, Heart Disease, Kidney Disease, Lung Disease, Seizures, Thyroid Problems, Tuberculosis. Social History Never smoker, Marital Status - Married, Alcohol Use - Never, Drug Use - No History, Caffeine Use - Daily. Medical History Eyes Patient has history of Cataracts - both removed Denies history of Glaucoma, Optic Neuritis Ear/Nose/Mouth/Throat Denies history of Chronic sinus problems/congestion, Middle ear problems Hematologic/Lymphatic Denies history of Anemia, Hemophilia, Human Immunodeficiency Virus, Lymphedema Respiratory Denies history of Aspiration, Asthma, Chronic Obstructive Pulmonary Disease (COPD),  Pneumothorax, Sleep Apnea, Tuberculosis Cardiovascular Patient has history of Hypertension - takes medication Denies history of Angina, Arrhythmia, Congestive Heart Failure, Coronary Artery Disease, Deep Vein Thrombosis, Hypotension, Myocardial Infarction, Peripheral Arterial Disease, Peripheral Venous Disease, Phlebitis, Vasculitis Gastrointestinal Denies history of Cirrhosis , Colitis, Crohn s, Hepatitis A, Hepatitis B, Hepatitis C Endocrine Denies history of Type I Diabetes, Type II Diabetes Genitourinary Denies history of End Stage Renal Disease Immunological Denies history of Lupus Erythematosus, Raynaud s, Scleroderma Integumentary (Skin) Denies history of History of Burn, History of pressure wounds Musculoskeletal Denies history of Gout, Rheumatoid Arthritis, Osteoarthritis, Osteomyelitis Neurologic Patient has history of Paraplegia - waist down Denies history of Dementia, Neuropathy, Quadriplegia, Seizure Disorder Oncologic Denies history of Received Chemotherapy, Received Radiation Psychiatric Denies history of Anorexia/bulimia, Confinement Anxiety Medical And Surgical History Notes Oncologic Prostate cancer- currently treated with horomone therapy Review of Systems (ROS) Kloss, Maziah J. ([id number]) Constitutional Symptoms (General Health) Denies complaints or symptoms of Fatigue, Fever, Chills, Marked Weight Change. Respiratory Denies complaints or symptoms of Chronic or frequent coughs, Shortness of Breath. Cardiovascular Denies complaints or symptoms of Chest pain, LE edema. Psychiatric Denies complaints or symptoms of Anxiety, Claustrophobia. Objective Constitutional Well-nourished and well-hydrated in no acute distress. Vitals Time Taken: 9:03 AM, Height: 73 in, Weight: 210 lbs, BMI: 27.7, Temperature: 98.2 F, Pulse: 60 bpm, Respiratory Rate: 16 breaths/min, Blood Pressure: 130/65 mmHg. Respiratory normal breathing without  difficulty. Psychiatric this patient is able to make decisions and demonstrates good insight into disease process. Alert and Oriented x 3. pleasant and cooperative. General Notes: On evaluation today patient's wound overall appear to be doing fairly well although I did perform sharp debridement of the central portion of the wound where he had a overgrowth of granular tissue which the patient tolerated without complication. Post debridement the wound bed actually appear to be doing much better there was some bleeding silver nitrate in the amount of one stick was utilized to chemically cauterize the area to stop the bleeding. Integumentary (Hair, Skin) Wound #1 status is Open. Original cause of  wound was Pressure Injury. The wound is located on the Right Gluteal fold. The wound measures 2.7cm length x 2.1cm width x 3.5cm depth; 4.453cm^2 area and 15.586cm^3 volume. There is Fat Layer (Subcutaneous Tissue) Exposed exposed. There is no undermining noted, however, there is tunneling at 12:00 with a maximum distance of 4.3cm. There is a medium amount of serous drainage noted. The wound margin is epibole. There is large (67-100%) red, pink, hyper - granulation within the wound bed. There is a small (1-33%) amount of necrotic tissue within the wound bed including Adherent Slough. The periwound skin appearance exhibited: Rash, Maceration. The periwound skin appearance did not exhibit: Callus, Crepitus, Excoriation, Induration, Scarring, Dry/Scaly, Atrophie Blanche, Cyanosis, Ecchymosis, Hemosiderin Staining, Mottled, Pallor, Rubor, Erythema. Periwound temperature was noted as No Abnormality. The periwound has tenderness on palpation. Assessment Active Problems ICD-10 CHRYSTOPHER, STANGL. 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 There was a Excisional Skin/Subcutaneous Tissue Debridement with a total area of 5.67 sq cm performed by [name], HOYT E., [name]. With the following instrument(s): Curette to remove Viable tissue/material. Material removed includes Subcutaneous Tissue and Slough and after achieving pain control using Lidocaine. No specimens were taken. A time out was conducted at 09:28, prior to the start of the procedure. A Moderate amount of bleeding was controlled with Silver Nitrate. The procedure was tolerated well with a pain level of 0 throughout and a pain level of 0 following the procedure. Post Debridement Measurements: 2.7cm length x 2.1cm width x 3.5cm depth; 15.586cm^3 volume. Post debridement Stage noted as Category/Stage IV. Character of Wound/Ulcer Post Debridement is improved. Post procedure Diagnosis Wound #1: Same as Pre-Procedure Plan Wound Cleansing: Wound #1 Right Gluteal fold: Cleanse wound with mild soap and water Anesthetic (add to Medication List): Wound #1 Right Gluteal fold: Topical Lidocaine 4% cream applied to wound bed prior to debridement (In Clinic Only). Primary Wound Dressing: Wound #1 Right Gluteal fold: Dry Gauze - wet to dry in clinic until SNF restarts NPWT Secondary Dressing: Wound #1 Right Gluteal fold: Boardered Foam Dressing - in clinic until SNF restarts NPWT Dressing Change Frequency: Wound #1 Right Gluteal fold: Change Dressing Monday, Wednesday, Friday - and as needed Follow-up Appointments: Wound #1 Right Gluteal fold: Return Appointment in 1 week. Off-Loading: Wound #1 Right Gluteal fold: Mattress - Please continue air mattress at SNF Turn and reposition every 2 hours Negative Pressure Wound Therapy: Wound #1 Right Gluteal fold: Wound VAC settings at 125/130 mmHg continuous pressure. Use BLACK/GREEN foam to wound cavity. Use  WHITE foam to fill any tunnel/s and/or undermining. Change VAC dressing 3 X WEEK. Change canister as indicated when full. Nurse may titrate settings and frequency of dressing changes as clinically indicated. - PLEASE, apply Silver Collagen to line the base of Suess, Quintel J. ([id number]) the wound. Black foam only! Thank you. Home Health Nurse may d/c VAC for s/s of increased infection, significant wound regression, or uncontrolled drainage. [place] at [phone]. Number of foam/gauze pieces used in the dressing = My suggestion at this point is gonna be that we go ahead and continue with the above wound care measures for the next week and the patient is in agreement with plan. We will subsequently see were things stand at follow-up. If anything  changes or worsens meantime patient will contact the office and let me know. Please see above for specific wound care orders. We will see patient for re-evaluation in 1 week(s) here in the clinic. If anything worsens or changes patient will contact our office for additional recommendations. Electronic Signature(s) Signed: 11/15/2018 5:07:11 PM By: Worthy Keeler [name] Entered By: Worthy Keeler on 11/15/2018 10:26:29 ANTONYO, HINDERER ([id number]) -------------------------------------------------------------------------------- ROS/PFSH Details Patient Name: [name] Date of Service: 11/15/2018 9:00 AM Medical Record Number: [id number] Patient Account Number: [id number] Date of Birth/Sex: Aug 23, 1950 (68 y.o. M) Treating RN: Harold Barban Primary Care Provider: [name] Other Clinician: Referring Provider: [name] Treating Provider/Extender: [name], HOYT Weeks in Treatment: 34 Information Obtained From Patient Constitutional Symptoms (General Health) Complaints and Symptoms: Negative for: Fatigue; Fever; Chills; Marked Weight Change Respiratory Complaints and Symptoms: Negative for:  Chronic or frequent coughs; Shortness of Breath Medical History: Negative for: Aspiration; Asthma; Chronic Obstructive Pulmonary Disease (COPD); Pneumothorax; Sleep Apnea; Tuberculosis Cardiovascular Complaints and Symptoms: Negative for: Chest pain; LE edema Medical History: Positive for: Hypertension - takes medication Negative for: Angina; Arrhythmia; Congestive Heart Failure; Coronary Artery Disease; Deep Vein Thrombosis; Hypotension; Myocardial Infarction; Peripheral Arterial Disease; Peripheral Venous Disease; Phlebitis; Vasculitis Psychiatric Complaints and Symptoms: Negative for: Anxiety; Claustrophobia Medical History: Negative for: Anorexia/bulimia; Confinement Anxiety Eyes Medical History: Positive for: Cataracts - both removed Negative for: Glaucoma; Optic Neuritis Ear/Nose/Mouth/Throat Medical History: Negative for: Chronic sinus problems/congestion; Middle ear problems Hematologic/Lymphatic Medical History: Negative for: Anemia; Hemophilia; Human Immunodeficiency Virus; Lymphedema MOHAMUD, MROZEK. ([id number]) Gastrointestinal Medical History: Negative for: Cirrhosis ; Colitis; Crohnos; Hepatitis A; Hepatitis B; Hepatitis C Endocrine Medical History: Negative for: Type I Diabetes; Type II Diabetes Genitourinary Medical History: Negative for: End Stage Renal Disease Immunological Medical History: Negative for: Lupus Erythematosus; Raynaudos; Scleroderma Integumentary (Skin) Medical History: Negative for: History of Burn; History of pressure wounds Musculoskeletal Medical History: Negative for: Gout; Rheumatoid Arthritis; Osteoarthritis; Osteomyelitis Neurologic Medical History: Positive for: Paraplegia - waist down Negative for: Dementia; Neuropathy; Quadriplegia; Seizure Disorder Oncologic Medical History: Negative for: Received Chemotherapy; Received Radiation Past Medical History Notes: Prostate cancer- currently treated with horomone  therapy HBO Extended History Items Eyes: Cataracts Immunizations Pneumococcal Vaccine: Received Pneumococcal Vaccination: No Implantable Devices No devices added Family and Social History Cancer: No; Diabetes: No; Heart Disease: No; Hypertension: Yes - Father; Kidney Disease: No; Lung Disease: No; Seizures: No; Stroke: Yes - Mother; Thyroid Problems: No; Tuberculosis: No; Never smoker; Marital Status - Married; Alcohol Use: Never; Drug Use: No History; Caffeine Use: Daily NEWEL, OIEN ([id number]) Physician Affirmation I have reviewed and agree with the above information. Electronic Signature(s) Signed: 11/15/2018 4:10:42 PM By: Harold Barban Signed: 11/15/2018 5:07:11 PM By: Worthy Keeler [name] Entered By: Worthy Keeler on 11/15/2018 10:25:42 DEMARQUIS, OSLEY ([id number]) -------------------------------------------------------------------------------- [place] Details Patient Name: [name] Date of Service: 11/15/2018 Medical Record Number: [id number] Patient Account Number: [id number] Date of Birth/Sex: [birth year] (68 y.o. M) Treating RN: Harold Barban Primary Care Provider: [name] Other Clinician: Referring Provider: [name] Treating Provider/Extender: [name], HOYT Weeks in Treatment: 50 Diagnosis Coding ICD-10 Codes Code Description L89.314 Pressure ulcer of right buttock, stage 4 L03.317 Cellulitis of buttock G82.20 Paraplegia, unspecified S34.109S Unspecified injury to unspecified level of lumbar spinal cord, sequela I10 Essential (primary) hypertension Facility Procedures CPT4 Code: [id number] Description: 89373 - DEB SUBQ TISSUE 20 SQ CM/< ICD-10 Diagnosis Description L89.314 Pressure ulcer of right buttock, stage 4 Modifier: Quantity: 1 Physician Procedures  CPT4 Code: 7505183 Description: 35825 - WC PHYS SUBQ TISS 20 SQ CM ICD-10 Diagnosis Description L89.314 Pressure ulcer of right buttock, stage  4 Modifier: Quantity: 1 Electronic Signature(s) Signed: 11/15/2018 5:07:11 PM By: Worthy Keeler [name] Entered By: Worthy Keeler on 11/15/2018 10:26:39

## 2018-11-23 ENCOUNTER — Other Ambulatory Visit: Payer: Self-pay

## 2018-11-23 ENCOUNTER — Encounter: Payer: Medicare Other | Attending: Physician Assistant | Admitting: Physician Assistant

## 2018-11-23 DIAGNOSIS — L03317 Cellulitis of buttock: Secondary | ICD-10-CM | POA: Diagnosis not present

## 2018-11-23 DIAGNOSIS — Z8249 Family history of ischemic heart disease and other diseases of the circulatory system: Secondary | ICD-10-CM | POA: Diagnosis not present

## 2018-11-23 DIAGNOSIS — C61 Malignant neoplasm of prostate: Secondary | ICD-10-CM | POA: Diagnosis not present

## 2018-11-23 DIAGNOSIS — L89314 Pressure ulcer of right buttock, stage 4: Secondary | ICD-10-CM | POA: Diagnosis present

## 2018-11-23 DIAGNOSIS — G8220 Paraplegia, unspecified: Secondary | ICD-10-CM | POA: Insufficient documentation

## 2018-11-23 DIAGNOSIS — Z79899 Other long term (current) drug therapy: Secondary | ICD-10-CM | POA: Insufficient documentation

## 2018-11-23 DIAGNOSIS — Z923 Personal history of irradiation: Secondary | ICD-10-CM | POA: Diagnosis not present

## 2018-11-23 DIAGNOSIS — I10 Essential (primary) hypertension: Secondary | ICD-10-CM | POA: Diagnosis not present

## 2018-11-23 NOTE — Progress Notes (Signed)
ATUL, DELUCIA ([id number]) Visit Report for 11/23/2018 Arrival Information Details Patient Name: [name], [name] Date of Service: 11/23/2018 8:15 AM Medical Record Number: [id number] Patient Account Number: [id number] Date of Birth/Sex: 1950/12/01 (68 y.o. M) Treating RN: [name] Primary Care Verniece Encarnacion: [name] Other Clinician: Referring Rozelle Caudle: [name] Treating Kariann Wecker/Extender: [name], HOYT Weeks in Treatment: 55 Visit Information History Since Last Visit Added or deleted any medications: No Patient Arrived: Wheel Chair Any new allergies or adverse reactions: No Arrival Time: 08:51 Had a fall or experienced change in No Accompanied By: wife activities of daily living that may affect Transfer Assistance: Manual risk of falls: Patient Identification Verified: Yes Signs or symptoms of abuse/neglect since last visito No Secondary Verification Process Completed: Yes Hospitalized since last visit: No Patient Requires Transmission-Based No Implantable device outside of the clinic excluding No Precautions: cellular tissue based products placed in the center Patient Has Alerts: Yes since last visit: Patient Alerts: NOT Has Dressing in Place as Prescribed: Yes Diabetic Pain Present Now: No Electronic Signature(s) Signed: 11/23/2018 1:36:45 PM By: Gretta Cool, BSN, RN, CWS, Kim RN, BSN Entered By: Gretta Cool, BSN, RN, CWS, Kim on 11/23/2018 08:52:18 [name], [name] ([id number]) -------------------------------------------------------------------------------- Encounter Discharge Information Details Patient Name: [name] Date of Service: 11/23/2018 8:15 AM Medical Record Number: [id number] Patient Account Number: [id number] Date of Birth/Sex: 21-Nov-1950 (68 y.o. M) Treating RN: Montey Hora Primary Care Yusra Ravert: [name] Other Clinician: Referring Shoua Ulloa: [name] Treating Johnathan Heskett/Extender: [name], HOYT Weeks in  Treatment: 51 Encounter Discharge Information Items Discharge Condition: Stable Ambulatory Status: Wheelchair Discharge Destination: Home Transportation: Private Auto Accompanied By: wife Schedule Follow-up Appointment: Yes Clinical Summary of Care: Electronic Signature(s) Signed: 11/23/2018 1:35:27 PM By: Montey Hora Entered By: Montey Hora on 11/23/2018 09:52:22 [name] ([id number]) -------------------------------------------------------------------------------- Lower Extremity Assessment Details Patient Name: [name] Date of Service: 11/23/2018 8:15 AM Medical Record Number: [id number] Patient Account Number: [id number] Date of Birth/Sex: October 21, 1950 (68 y.o. M) Treating RN: [name] Primary Care Acire Tang: [name] Other Clinician: Referring Jack Mineau: [name] Treating Rohn Fritsch/Extender: Sharalyn Ink in Treatment: 54 Electronic Signature(s) Signed: 11/23/2018 1:36:45 PM By: Gretta Cool, BSN, RN, CWS, Kim RN, BSN Entered By: Gretta Cool, BSN, RN, CWS, Kim on 11/23/2018 09:04:18 [name], [name] ([id number]) -------------------------------------------------------------------------------- Multi Wound Chart Details Patient Name: [name] Date of Service: 11/23/2018 8:15 AM Medical Record Number: [id number] Patient Account Number: [id number] Date of Birth/Sex: 1950/12/03 (68 y.o. M) Treating RN: Montey Hora Primary Care Nkenge Sonntag: [name] Other Clinician: Referring Irelynn Schermerhorn: [name] Treating Jordain Radin/Extender: [name], HOYT Weeks in Treatment: 51 Vital Signs Height(in): 73 Pulse(bpm): 80 Weight(lbs): 210 Blood Pressure(mmHg): 133/80 Body Mass Index(BMI): 28 Temperature(F): 98.5 Respiratory Rate 16 (breaths/min): Photos: [N/A:N/A] Wound Location: Right Gluteal fold N/A N/A Wounding Event: Pressure Injury N/A N/A Primary Etiology: Pressure Ulcer N/A N/A Comorbid History: Cataracts, Hypertension,  N/A N/A Paraplegia Date Acquired: 11/02/2017 N/A N/A Weeks of Treatment: 51 N/A N/A Wound Status: Open N/A N/A Measurements L x W x D 3x2x3.2 N/A N/A (cm) Area (cm) : 4.712 N/A N/A Volume (cm) : 15.08 N/A N/A % Reduction in Area: 53.10% N/A N/A % Reduction in Volume: -1400.50% N/A N/A Position 1 (o'clock): 12 Maximum Distance 1 (cm): 4.6 Tunneling: Yes N/A N/A Classification: Category/Stage IV N/A N/A Exudate Amount: Large N/A N/A Exudate Type: Serous N/A N/A Exudate Color: amber N/A N/A Wound Margin: Epibole N/A N/A Granulation Amount: Large (67-100%) N/A N/A Granulation Quality: Red, Pink, Hyper-granulation N/A N/A Necrotic  Amount: Small (1-33%) N/A N/A Exposed Structures: Fat Layer (Subcutaneous N/A N/A Tissue) Exposed: Yes Muscle: Yes Fascia: No [name], [name]. ([id number]) Tendon: No Joint: No Bone: No Epithelialization: None N/A N/A Periwound Skin Texture: Rash: Yes N/A N/A Excoriation: No Induration: No Callus: No Crepitus: No Scarring: No Periwound Skin Moisture: Maceration: Yes N/A N/A Dry/Scaly: No Periwound Skin Color: Atrophie Blanche: No N/A N/A Cyanosis: No Ecchymosis: No Erythema: No Hemosiderin Staining: No Mottled: No Pallor: No Rubor: No Temperature: No Abnormality N/A N/A Tenderness on Palpation: Yes N/A N/A Treatment Notes Electronic Signature(s) Signed: 11/23/2018 1:35:27 PM By: Montey Hora Entered By: Montey Hora on 11/23/2018 09:48:09 [name] ([id number]) -------------------------------------------------------------------------------- [place] Details Patient Name: [name] Date of Service: 11/23/2018 8:15 AM Medical Record Number: [id number] Patient Account Number: [id number] Date of Birth/Sex: 1951-01-30 (68 y.o. M) Treating RN: Montey Hora Primary Care Wynelle Dreier: [name] Other Clinician: Referring Sabria Florido: [name] Treating Dason Mosley/Extender: [name],  HOYT Weeks in Treatment: 43 Active Inactive Orientation to the Wound Care Program Nursing Diagnoses: Knowledge deficit related to the wound healing center program Goals: Patient/caregiver will verbalize understanding of the [facility] Program Date Initiated: 11/30/2017 Target Resolution Date: 12/21/2017 Goal Status: Active Interventions: Provide education on orientation to the wound center Notes: Pressure Nursing Diagnoses: Knowledge deficit related to management of pressures ulcers Goals: Patient/caregiver will verbalize understanding of pressure ulcer management Date Initiated: 05/17/2018 Target Resolution Date: 05/28/2018 Goal Status: Active Interventions: Assess: immobility, friction, shearing, incontinence upon admission and as needed Notes: Wound/Skin Impairment Nursing Diagnoses: Impaired tissue integrity Goals: Patient/caregiver will verbalize understanding of skin care regimen Date Initiated: 11/30/2017 Target Resolution Date: 12/21/2017 Goal Status: Active Ulcer/skin breakdown will have a volume reduction of 30% by week 4 Date Initiated: 11/30/2017 Target Resolution Date: 12/21/2017 Goal Status: Active [name], [name] ([id number]) Interventions: Assess patient/caregiver ability to obtain necessary supplies Assess patient/caregiver ability to perform ulcer/skin care regimen upon admission and as needed Assess ulceration(s) every visit Treatment Activities: Skin care regimen initiated : 11/30/2017 Notes: Electronic Signature(s) Signed: 11/23/2018 1:35:27 PM By: Montey Hora Entered By: Montey Hora on 11/23/2018 09:47:49 [name], [name] ([id number]) -------------------------------------------------------------------------------- Pain Assessment Details Patient Name: [name] Date of Service: 11/23/2018 8:15 AM Medical Record Number: [id number] Patient Account Number: [id number] Date of Birth/Sex: 1950/11/09 (68 y.o. M) Treating RN: [name] Primary Care Toyia Jelinek: [name] Other Clinician: Referring Tamakia Porto: [name] Treating Barlow Harrison/Extender: [name], HOYT Weeks in Treatment: 63 Active Problems Location of Pain Severity and Description of Pain Patient Has Paino Yes Site Locations Pain Location: Pain in Ulcers Rate the pain. Current Pain Level: 8 Pain Management and Medication Current Pain Management: Notes Only hurts when he sits on it. Electronic Signature(s) Signed: 11/23/2018 1:36:45 PM By: Gretta Cool, BSN, RN, CWS, Kim RN, BSN Entered By: Gretta Cool, BSN, RN, CWS, Kim on 11/23/2018 08:52:59 [name], [name] ([id number]) -------------------------------------------------------------------------------- Patient/Caregiver Education Details Patient Name: [name] Date of Service: 11/23/2018 8:15 AM Medical Record Number: [id number] Patient Account Number: [id number] Date of Birth/Gender: Nov 25, 1950 (68 y.o. M) Treating RN: Montey Hora Primary Care Physician: [name] Other Clinician: Referring Physician: [name] Treating Physician/Extender: Sharalyn Ink in Treatment: 36 Education Assessment Education Provided To: Patient and Caregiver Education Topics Provided Wound/Skin Impairment: Handouts: Other: use of duoderm under npwt Methods: Explain/Verbal Responses: State content correctly Electronic Signature(s) Signed: 11/23/2018 1:35:27 PM By: Montey Hora Entered By: Montey Hora on 11/23/2018 09:51:52 [name], [name] ([id number]) -------------------------------------------------------------------------------- Wound Assessment Details Patient  Name: [name], [name] Date of Service: 11/23/2018 8:15 AM Medical Record Number: [id number] Patient Account Number: [id number] Date of Birth/Sex: 1951/04/16 (69 y.o. M) Treating RN: [name] Primary Care Eluzer Howdeshell: [name] Other Clinician: Referring Audray Rumore: [name] Treating  Domonic Hiscox/Extender: [name], HOYT Weeks in Treatment: 51 Wound Status Wound Number: 1 Primary Etiology: Pressure Ulcer Wound Location: Right Gluteal fold Wound Status: Open Wounding Event: Pressure Injury Comorbid History: Cataracts, Hypertension, Paraplegia Date Acquired: 11/02/2017 Weeks Of Treatment: 51 Clustered Wound: No Photos Wound Measurements Length: (cm) 3 Width: (cm) 2 Depth: (cm) 3.2 Area: (cm) 4.712 Volume: (cm) 15.08 % Reduction in Area: 53.1% % Reduction in Volume: -1400.5% Epithelialization: None Tunneling: Yes Position (o'clock): 12 Maximum Distance: (cm) 4.6 Wound Description Classification: Category/Stage IV Wound Margin: Epibole Exudate Amount: Large Exudate Type: Serous Exudate Color: amber Foul Odor After Cleansing: No Slough/Fibrino Yes Wound Bed Granulation Amount: Large (67-100%) Exposed Structure Granulation Quality: Red, Pink, Hyper-granulation Fascia Exposed: No Necrotic Amount: Small (1-33%) Fat Layer (Subcutaneous Tissue) Exposed: Yes Necrotic Quality: Adherent Slough Tendon Exposed: No Muscle Exposed: Yes Necrosis of Muscle: No Joint Exposed: No Bone Exposed: No [name], [name] ([id number]) Periwound Skin Texture Texture Color No Abnormalities Noted: No No Abnormalities Noted: No Callus: No Atrophie Blanche: No Crepitus: No Cyanosis: No Excoriation: No Ecchymosis: No Induration: No Erythema: No Rash: Yes Hemosiderin Staining: No Scarring: No Mottled: No Pallor: No Moisture Rubor: No No Abnormalities Noted: No Dry / Scaly: No Temperature / Pain Maceration: Yes Temperature: No Abnormality Tenderness on Palpation: Yes Treatment Notes Wound #1 (Right Gluteal fold) Notes dakins wet to dry, bordered foam dressing; SNF nurses to reapply NPWT Electronic Signature(s) Signed: 11/23/2018 1:36:45 PM By: Gretta Cool, BSN, RN, CWS, Kim RN, BSN Entered By: Gretta Cool, BSN, RN, CWS, Kim on 11/23/2018 09:00:25 [name], [name]  ([id number]) -------------------------------------------------------------------------------- [place] Details Patient Name: [name] Date of Service: 11/23/2018 8:15 AM Medical Record Number: [id number] Patient Account Number: [id number] Date of Birth/Sex: August 06, 1950 (68 y.o. M) Treating RN: [name] Primary Care Aftyn Nott: [name] Other Clinician: Referring Harbor Vanover: [name] Treating Zachry Hopfensperger/Extender: [name], HOYT Weeks in Treatment: 51 Vital Signs Time Taken: 08:53 Temperature (F): 98.5 Height (in): 73 Pulse (bpm): 80 Weight (lbs): 210 Respiratory Rate (breaths/min): 16 Body Mass Index (BMI): 27.7 Blood Pressure (mmHg): 133/80 Reference Range: 80 - 120 mg / dl Electronic Signature(s) Signed: 11/23/2018 1:36:45 PM By: Gretta Cool, BSN, RN, CWS, Kim RN, BSN Entered By: Gretta Cool, BSN, RN, CWS, Kim on 11/23/2018 08:53:18

## 2018-11-25 NOTE — Progress Notes (Signed)
[name] ([id number]) Visit Report for 11/23/2018 Chief Complaint Document Details Patient Name: [name], [name] Date of Service: 11/23/2018 8:15 AM Medical Record Number: [id number] Patient Account Number: [id number] Date of Birth/Sex: 1950/10/01 (68 y.o. M) Treating RN: Montey Hora Primary Care Provider: [name] Other Clinician: Referring Provider: [name] Treating Provider/Extender: [name], HOYT Weeks in Treatment: 66 Information Obtained from: Patient Chief Complaint Right gluteal fold ulcer Electronic Signature(s) Signed: 11/23/2018 3:19:36 PM By: Worthy Keeler [name] Entered By: Worthy Keeler on 11/23/2018 08:47:13 MAC, DOWDELL ([id number]) -------------------------------------------------------------------------------- HPI Details Patient Name: [name] Date of Service: 11/23/2018 8:15 AM Medical Record Number: [id number] Patient Account Number: [id number] Date of Birth/Sex: 1950-09-15 (68 y.o. 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 He states that there is no history of diabetes that he is aware of. He does have issues with his prostate and is currently receiving treatment for this by way of oral medication. With that being said I do not have a lot of details in that regard. Nonetheless the patient presents today as a result of having been referred to [place] by another provider initially home health was set to come out and take care of his wound although due to the fact that he  apparently drives he's not able to receive home health. His wife is therefore trying to help take care of this wound within although they have been struggling with what exactly to do at this point. She states that she can do some things but she is definitely not a nurse and does have some issues with looking at blood. The good news is the wound does not appear to be too deep and is fairly superficial at this point. There is no slough noted there is some nonviable skin noted around the surface of the wound and the perimeter at this point. The central portion of the wound appears to be very good with a dermal layer noted this does not appear to be again deep enough to extend it to subcutaneous tissue at this point. 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 In the pump he has Dilaudid and baclofen. 12/07/17 on evaluation today patient presents for follow-up concerning his ongoing lower back thigh ulcer on the right. He states that he did not get the supplies ordered and therefore has not really been able to perform the dressing changes as directed exactly. His wife was able to get some Boarder Foam Dressing's from the drugstore and subsequently has been using hydrogel which did help to a degree in the wound does appear to be able smaller. There is actually more drainage this week noted than previous. 12/21/17 on evaluation today patient appears to be doing rather well in regard to his right gluteal ulcer. He has been tolerating the dressing changes without complication. There does not appear to be any evidence of infection at this point in time. Overall the wound does seem to be making some progress as far as the edges  are concerned there's not as much in the way of overlapping of the external wound edges and he has a good epithelium to wound bed border for the most part. This however is not true  right at the 12 o'clock location over the span of a little over a centimeters which actually will require debridement today to clean this away and hopefully allow it to continue to heal more appropriately. 12/28/17 on evaluation today patient appears to be doing rather well in regard to his ulcer in the left gluteal region. He's been tolerating the dressing changes without complication. Apparently he has had some difficulty getting his dressing material. Apparently there's been some confusion with ordering we're gonna check into this. Nonetheless overall he's been showing signs of improvement which is good news. Debridement is not required today. 01/04/18 on evaluation today patient presents for follow-up concerning his right gluteal ulcer. He has been tolerating the dressing changes fairly well. On inspection today it appears he may actually have some maceration them concerned about the fact that he may be developing too much moisture in and around the wound bed which can cause delay in healing. With that being said he unfortunately really has not showed significant signs of improvement since last week's evaluation in fact this may even be just the little bit/slightly larger. Nonetheless he's been having a lot of discomfort I'm not sure this is even related to the wound as he has no pain when I'm to breeding or otherwise cleaning the wound during evaluation today. Nonetheless this is something that we did recommend he talked to his pain specialist concerning. 01/11/18 on evaluation today patient appears to be doing better in regard to his ulceration. He has been tolerating the dressing changes without complication. With that being said overall there's no evidence of infection which is good news. The only thing is he did receive the hatch affair blue classic versus the ready nonetheless I feel like this is perfectly fine and appears to have done well for him over the past week. 01/25/18 on evaluation  today patient's wound actually appears to be a little bit larger than during the last evaluation. The good [name], [name]. ([id number]) news is the majority of the wound edges actually appear to be fairly firmly attached to the wound bed unfortunately again we're not really making progress in regard to the size. Roughly the wound is about the same size as when I first saw him although again the wound margin/edges appear to be much better. 02/01/18 on evaluation today patient actually appears to be doing very well in regard to his wound. Applying the Prisma dry does seem to be better although he does still have issues with slow progression of the wound. There was a slight improvement compared to last week's measurements today. Nonetheless I have been considering other options as far as the possibility of Theraskin or even a snap vac. In general I'm not sure that the Theraskin due to location of the wound would be a very good idea. Nonetheless I do think that a snap vac could be a possibility for the patient and in fact I think this could even be an excellent way to manage the wound possibly seeing some improvement in a very rapid fashion here. Nonetheless this is something that we would need to get approved and I did have a lengthy conversation with the patient about this today. 02/08/18 on evaluation today patient appears to be doing a little better in regard to his ulcer.  He has been tolerating the dressing changes without complication. Fortunately despite the fact that the wound is a little bit smaller it's not significantly so unfortunately. We have discussed the possibility of a snap vac we did check with insurance this is actually covered at this point. Fortunately there does not appear to be any sign of infection. Overall I'm fairly pleased with how things seem to be appearing at this point. 02/15/18 on evaluation today patient appears to be doing rather well in regard to his right gluteal  ulcer. Unfortunately the snap vac did not stay in place with his sheer and friction this came loose and did not seem to maintain seal very well. He worked for about two days and it did seem to do very well during that time according to his wife but in general this does not seem to be something that's gonna be beneficial for him long-term. I do believe we need to go back to standard dressings to see if we can find something that will be of benefit. 03/02/18- He is here in follow up evaluation; there is minimal change in the wound. He will continue with the same treatment plan, would consider changing to iodosrob/iodoflex if ulcer continues to to plateau. He will follow up next week 03/08/18 on evaluation today patient's wound actually appears to be about the same size as when I previously saw him several weeks back. Unfortunately he does have some slightly dark discoloration in the central portion of the wound which has me concerned about pressure injury. I do believe he may be sitting for too long a period of time in fact he tells me that "I probably sit for much too long". He does have some Slough noted on the surface of the wound and again as far as the size of the wound is concerned I'm really not seeing anything that seems to have improved significantly. 03/15/18 on evaluation today patient appears to be doing fairly well in regard to his ulcer. The wound measured pretty much about the same today compared to last week's evaluation when looking at his graph. With that being said the area of bruising/deep tissue injury that was noted last week I do not see at this point. He did get a new cushion fortunately this does seem to be have been of benefit in my pinion. It does appear that he's been off of this more which is good news as well I think that is definitely showing in the overall wound measurements. With that being said I do believe that he needs to continue to offload I don't think that the fact  this is doing better should be or is going to allow him to not have to offload and explain this to him as well. Overall he seems to be in agreement the plan I think he understands. The overall appearance of the wound bed is improved compared to last week I think the Iodoflex has been beneficial in that regard. 03/29/18 on evaluation today patient actually appears to be doing rather well in regard to his wound from the overall appearance standpoint he does have some granulation although there's some Slough on the surface of the wound noted as well. With that being said he unfortunately has not improved in regard to the overall measurement of the wound in volume or in size. I did have a discussion with him very specifically about offloading today. He actually does work although he mainly is just sitting throughout the day. He tells me  he offloads by "lifting himself up for 30 seconds off of his chair occasionally" purchase from [employer] which does seem to have helped. And he has a new cushion that he with that being said he's also able to stand some for a very short period of time but not significant enough I think to provide appropriate offloading. I think the biggest issue at this point with the wound and the fact is not healing as quickly as we would like is due to the fact that he is really not able to appropriately offload while at work. He states the beginning after his injury he actually had a bed at his job that he could lay on in order to offload and that does seem to have been of help back at that time. Nonetheless he had not done this in quite some time unfortunately. I think that could be helpful for him this is something I would like for him to look into. 04/05/18 on evaluation today patient actually presents for follow-up concerning his right gluteal ulcer. Again he really is not significantly improved even compared to last week. He has been tolerating the dressing changes without  complication. With that being said fortunately there appears to be no evidence of infection at this time. He has been more proactive in trying to offload. RITVIK, MCZEAL ([id number]) 04/12/18 on evaluation today patient actually appears to be doing a little better in regard to his wound and the right gluteal fold region. He's been tolerating the dressing changes since removing the oasis without complication. However he was having a lot of burning initially with the oasis in place. He's unsure of exactly why this was given so much discomfort but he assumes that it was the oasis itself causing the problem. Nonetheless this had to be removed after about three days in place although even those three days seem to have made a fairly good improvement in regard to the overall appearance of the wound bed. In fact is the first time that he's made any improvement from the standpoint of measurements in about six weeks. He continues to have no discomfort over the area of the wound itself which leads me to wonder why he was having the burning with the oasis when he does not even feel the actual debridement's themselves. I am somewhat perplexed by this. 04/19/18 on evaluation today patient's wound actually appears to be showing signs of epithelialization around the edge of the wound and in general actually appears to be doing better which is good news. He did have the same burning after about three days with applying the Endoform last week in the same fashion that I would generally apply a skin substitute. This seems to indicate that it's not the oasis to cause the problem but potentially the moisture buildup that just causes things to burn or there may be some other reaction with the skin prep or Steri-Strips. Nonetheless I'm not sure that is gonna be able to tolerate any skin substitute for a long period of time. The good news is the wound actually appears to be doing better today compared to last week and  does seem to finally be making some progress. 04/26/18 on evaluation today patient actually appears to be doing rather well in regard to his ulcer in the right gluteal fold. He has been tolerating the dressing changes without complication which is good news. The Endoform does seem to be helping although he was a little bit more macerated this week. This  seems to be an ongoing issue with fluid control at this point. Nonetheless I think we may be able to add something like Drawtex to help control the drainage. 05/03/18 on evaluation today patient appears to actually be doing better in regard to the overall appearance of his wound. He has been tolerating the dressing changes without complication. Fortunately there appears to be no evidence of infection at this time. I really feel like his wound has shown signs as of today of turning around last week I thought so as well and definitely he could be seen in this week's overall appearance and measurements. In general I'm very pleased with the fact that he finally seems to be making a steady but sure progress. The patient likewise is very pleased. 05/17/18 on evaluation today patient appears to be doing more poorly unfortunately in regard to his ulcer. He has been tolerating the dressing changes without complication. With that being said he tells me that in the past couple of days he and his wife have noticed that we did not seem to be doing quite as well is getting dark near the center. Subsequently upon evaluation today the wound actually does appear to be doing worse compared to previous. He has been tolerating the dressing changes otherwise and he states that he is not been sitting up anymore than he was in the past from what he tells me. Still he has continued to work he states "I'm tired of dealing with this and if I have to just go home and lay in the bed all the time that's what I'll do". Nonetheless I am concerned about the fact that this wound does  appear to be deeper than what it was previous. 05/24/18 upon evaluation today patient actually presents after having been in the hospital due to what was presumed to be sepsis secondary to the wound infection. He had an elevated white blood cell count between 14 and 15. With that being said he does seem to be doing somewhat better now. His wound still is giving him some trouble nonetheless and he is obviously concerned about the fact likely talked about that this does seem to go more deeply than previously noted. I did review his wound culture which showed evidence of Staphylococcus aureus him and group B strep. Nonetheless he is on antibiotics, Levaquin, for this. Subsequently I did review his intake summary from the hospital as well. I also did look at the CT of the lumbar spine with contrast that was performed which showed no bone destruction to suggest lumbar disguises/osteomyelitis or sacral osteomyelitis. There was no paraspinal abscess. Nonetheless it appears this may have been more of just a soft tissue infection at this point which is good news. He still is nonetheless concerned about the wound which again I think is completely reasonable considering everything he's been through recently. 05/31/18 on evaluation today on evaluation today patient actually appears to be showing signs of his wound be a little bit deeper than what I would like to see. Fortunately he does not show any signs of significant infection although his temperature was 99 today he states he's been checking this at home and has not been elevated. Nonetheless with the undermining that I'm seeing at this point I am becoming more concerned about the wound I do think that offloading is a key factor here that is preventing the speedy recovery at this point. There does not appear to be any evidence of again over infection noted. He's been using  Santyl currently. 06/07/18 the patient presents today for follow-up evaluation  regarding the left ulcer in the gluteal region. He has been tolerating the Wound VAC fairly well. He is obviously very frustrated with this he states that to mean is really getting in his way. There does not appear to be any evidence of infection at this time he does have a little bit of odor I do not necessarily associate this with infection just something that we sometimes notice with Wound VAC therapy. With that being said I can definitely catch a tone of discontentment overall in the patient's demeanor today. This when he was previously in the hospital [name], [name]. ([id number]) an CT scan was done of the lumbar region which did not reveal any signs of osteomyelitis. With that being said the pelvis in particular was not evaluated distinctly which means he could still have some osteonecrosis I. Nonetheless the Wound VAC was started on Thursday I do want to get this little bit more time before jumping to a CT scan of the pelvis although that is something that I might would recommend if were not see an improvement by that time. 06/14/18 on evaluation today patient actually appears to be doing about the same in regard to his right gluteal ulcer. Again he did have a CT scan of the lumbar spine unfortunately this did not include the pelvis. Nonetheless with the depth of the wound that I'm seeing today even despite the fact that I'm not seeing any evidence of overt cellulitis I believe there's a good chance that we may be dealing with osteomyelitis somewhere in the right Ischial region. No fevers, chills, nausea, or vomiting noted at this time. 06/21/18 on evaluation today patient actually appears to be doing about the same with regard to his wound. The tunnel at 6 o'clock really does not appear to be any deeper although it is a little bit wider. I think at this point you may want to start packing this with white phone. Unfortunately I have not got approval for the CT scan of the pelvis as of yet  due to the fact that Medicare apparently has been denied it due to the diagnosis codes not being appropriate according to Medicare for the test requested. With that being said the patient cannot have an MRI and therefore this is the only option that we have as far as testing is concerned. The patient has had infection and was on antibiotics and been added code for cellulitis of the bottom to see if this will be appropriate for getting the test approved. Nonetheless I'm concerned about the infection have been spread deeper into the Ischial region. 06/28/18 on evaluation today patient actually appears to be doing rather well all things considered in regard to the right gluteal ulcer. He has been tolerating the dressing changes without complication. With that being said the Wound VAC he states does have to be replaced almost every day or at least reinforced unfortunately. Patient actually has his CT scan later this morning we should have the results by tomorrow. 07/05/18 on evaluation today patient presents for follow-up concerning his right Ischial ulcer. He did see the surgeon Dr. Lysle Pearl last week. They were actually very happy with him and felt like he spent a tremendous amount of time with them as far as discussing his situation was concerned. In the end Dr. Lysle Pearl did contact me as well and determine that he would not recommend any surgical intervention at this point as he felt like  it would not be in the patient's best interest based on what he was seeing. He recommended a referral to infectious disease. Subsequently this is something that Dr. Ines Bloomer office is working on setting up for the patient. As far as evaluation today is concerned the patient's wound actually appears to be worse at this point. I am concerned about how things are progressing and specifically about infection. I do not feel like it's the deeper but the area of depth is definitely widening which does have me concerned. No  fevers, chills, nausea, or vomiting noted at this time. I think that we do need initiate antibiotic therapy the patient has an allow allergy to amoxicillin/penicillin he states that he gets a rash since childhood. Nonetheless she's never had the issues with Catholics or cephalosporins in general but he is aware of. 07/27/18 on evaluation today patient presents following admission to the hospital on 07/09/18. He was subsequently discharged on 07/20/18. On 07/15/18 the patient underwent irrigation and debridement was soft tissue biopsy and bone biopsy as well as placement of a Wound VAC in the OR by Dr. Celine Ahr. During the hospital course the patient was placed on a Wound VAC and recommended follow up with surgery in three weeks actually with Dr. Delaine Lame who is infectious disease. The patient was on vancomycin during the hospital course. 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 He also had a bone culture which revealed evidence of methicillin-resistant staph aureus. He is updated CT scan 07/09/18 reveals that he had progression of the which was performed on wound to breakdown down to the trochanter where he actually had irregularities there as well suggestive of osteomyelitis. This was a change just since 9 December when we last performed a CT scan. Obviously this one had gone downhill quite significantly and rapidly. At this point upon evaluation I feel like in general the patient's wound seems to be doing fairly well all things considered upon my evaluation today. Obviously this is larger and deeper than what I previously evaluated but at the same time he seems to be making some progress as far as the appearance of the granulation tissue is concerned. I'm happy in that regard. No fevers, chills, nausea, or vomiting noted at this time. He is on IV vancomycin and Rocephin at the facility. He is currently in [employer]. 08/03/18 upon evaluation today  patient's wound appears to be doing better in regard to the overall appearance at this point in time. Fortunately he's been tolerating the Wound VAC without complication and states that the facility has been taking excellent care of the wound site. Overall I see some Slough noted on the surface which I am going to attempt sharp debridement today of but nonetheless other than this I feel like he's making progress. 08/09/18 on evaluation today patient's wound appears to be doing much better compared to even last week's evaluation. Do believe that the Wound VAC is been of great benefit for him. He has been tolerating the dressing changes that is the Wound [name], [name]. ([id number]) VAC without any complication and he has excellent granulation noted currently. There is no need for sharp debridement at this point. 08/16/18 on evaluation today patient actually appears to be doing very well in regard to the wound in the right gluteal fold region. This is showing signs of progress and again appears to be very healthy which is excellent news. Fortunately there is no sign of active infection by way of odor  or drainage at this point. Overall I'm very pleased with how things stand. He seems to be tolerating the Wound VAC without complication. 08/23/18 on evaluation today patient actually appears to be doing better in regard to his wound. He has been tolerating the Wound VAC without complication and in fact it has been collecting a significant amount of drainage which I think is good news especially considering how the wound appears. Fortunately there is no signs of infection at this time definitely nothing appears to be worse which is good news. He has not been started on the Bactrim and Flagyl that was recommended by Dr. Delaine Lame yet. I did actually contact her office this morning in order to check and see were things are that regard their gonna be calling me back. 08/30/18 on evaluation today patient  actually appears to show signs of excellent improvement today compared to last evaluation. The undermining is getting much better the wound seems to be feeling quite nicely and I'm very pleased that the granulation in general. With that being said overall I feel like the patient has made excellent progress which is great news. No fevers, chills, nausea, or vomiting noted at this time. 09/06/18 on evaluation today patient actually appears to be doing rather well in regard to his right gluteal ulcer. This is showing signs of improvement in overall I'm very pleased with how things seem to be progressing. The patient likewise is please. Overall I see no evidence of infection he is about to complete his oral antibiotic regimen which is the end of the antibiotics for him in just about three days. 09/13/18 on evaluation today patient's right Ischial ulcer appears to be showing signs of continued improvement which is excellent news. He's been tolerating the dressing changes without complication. Fortunately there's no signs of infection and the wound that seems to be doing very well. 09/28/18 on evaluation today patient appears to be doing rather well in regard to his right Ischial ulcer. He's been tolerating the Wound VAC without complication he knows there's much less drainage than there used to be this obviously is not a bad thing in my pinion. There's no evidence of infection despite the fact is but nothing about it now for several weeks. 10/04/18 on evaluation today patient appears to be doing better in regard to his right Ischial wound. He has been tolerating the Wound VAC without complication and I do believe that the silver nitrate last week was beneficial for him. Fortunately overall there's no evidence of active infection at this time which is great news. No fevers, chills, nausea, or vomiting noted at this time. 10/11/18 on evaluation today patient actually appears to be doing rather well in regard  to his Ischial ulcer. He's been tolerating the Wound VAC still without complication I feel like this is doing a good job. No fevers, chills, nausea, or vomiting noted at this time. 11/01/18 on evaluation today patient presents after having not been seen in our clinic for several weeks secondary to the fact that he was on evaluation today patient presents after having not been seen in our clinic for several weeks secondary to the fact that he was in a skilled nursing facility which was on lockdown currently due to the covert 19 national emergency. Subsequently he was discharged from the facility on this past Friday and subsequently made an appointment to come in to see yesterday. Fortunately there's no signs of active infection at this time which is good news and overall he does  seem to have made progress since I last saw. Overall I feel like things are progressing quite nicely. The patient is having no pain. 11/08/18 on evaluation today patient appears to be doing okay in regard to his right gluteal ulcer. He has been utilizing a Wound VAC home health this changing this at this point since he's home from the skilled nursing facility. Fortunately there's no signs of obvious active infection at this time. Unfortunately though there's no obvious active infection he is having some maceration and his wife states that when the sheets of the Wound VAC office on Sunday when it broke seal that he ended up having significant issues with some smell as well there concerned about the possibility of infection. Fortunately there's No fevers, chills, nausea, or vomiting noted at this time. 11/15/18 on evaluation today patient actually appears to be doing well in regard to his right gluteal ulcer. He has been tolerating the dressing changes without complication. Specifically the Wound VAC has been utilized up to this point. Fortunately there's no signs of infection and overall I feel like he has made progress even  since last week when I last saw him. I'm actually fairly happy with the overall appearance although he does seem to have somewhat of a hyper granular Sandate, Caydyn J. ([id number]) overgrowth in the central portion of the wound which I think may require some sharp debridement to try flatness out possibly utilizing chemical cauterization following. 11/23/18 on evaluation today patient actually appears to be doing very well in regard to his sacral ulcer. He seems to be showing signs of improvement with good granulation. With that being said he still has the small area of hyper granulation right in the central portion of the wound which I'm gonna likely utilize silver nitrate on today. Subsequently he also keeps having a leak at the 6 o'clock location which is unfortunate we may be able to help out with some suggestions to try to prevent this going forward. Fortunately there's no signs of active infection at this time. Electronic Signature(s) Signed: 11/23/2018 3:19:36 PM By: Worthy Keeler [name] Entered By: Worthy Keeler on 11/23/2018 14:31:53 JERMY, COUPER ([id number]) -------------------------------------------------------------------------------- [facility] Details Patient Name: [name] Date of Service: 11/23/2018 8:15 AM Medical Record Number: [id number] Patient Account Number: [id number] Date of Birth/Sex: [birth year] (68 y.o. M) Treating RN: Montey Hora Primary Care Provider: [name] Other Clinician: Referring Provider: [name] Treating Provider/Extender: [name], HOYT Weeks in Treatment: 68 Procedure Performed for: Wound #1 Right Gluteal fold Performed By: Physician [name], HOYT E., [name] Post Procedure Diagnosis Same as Pre-procedure Notes 1 stick of silver nitrate used Electronic Signature(s) Signed: 11/23/2018 1:35:27 PM By: Montey Hora Entered By: Montey Hora on 11/23/2018 09:51:25 [name]  ([id number]) -------------------------------------------------------------------------------- Physical Exam Details Patient Name: [name] Date of Service: 11/23/2018 8:15 AM Medical Record Number: [id number] Patient Account Number: [id number] Date of Birth/Sex: Nov 01, 1950 (68 y.o. M) Treating RN: Montey Hora Primary Care Provider: [name] Other Clinician: Referring Provider: [name] Treating Provider/Extender: [name], HOYT Weeks in Treatment: 30 Constitutional Well-nourished and well-hydrated in no acute distress. Respiratory normal breathing without difficulty. clear to auscultation bilaterally. Cardiovascular regular rate and rhythm with normal S1, S2. Psychiatric this patient is able to make decisions and demonstrates good insight into disease process. Alert and Oriented x 3. pleasant and cooperative. Notes Upon inspection today patient's wound bed actually shows evidence of good granulation at this time there does not  appear to be any signs of active infection. I did utilize one take a silver nitrate complaint catalyze the central portion of the wound where you have some hyper granular tissue I may have to do this several times going forward over the next several weeks to try to help flatness out. Fortunately there's no evidence of active infection at this time. Electronic Signature(s) Signed: 11/23/2018 3:19:36 PM By: Worthy Keeler [name] Entered By: Worthy Keeler on 11/23/2018 14:32:43 ARREN, LAMINACK ([id number]) -------------------------------------------------------------------------------- Physician Orders Details Patient Name: [name] Date of Service: 11/23/2018 8:15 AM Medical Record Number: [id number] Patient Account Number: [id number] Date of Birth/Sex: 1951/03/26 (68 y.o. 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 Primary Wound Dressing Wound #1 Right Gluteal fold o Dry Gauze - wet to dry in clinic until [facility] restarts NPWT Secondary Dressing Wound #1 Right Gluteal fold o Boardered Foam Dressing - in clinic until [facility] restarts NPWT Dressing Change Frequency Wound #1 Right Gluteal fold o Change Dressing Monday, Wednesday, Friday - and as needed Follow-up Appointments Wound #1 Right Gluteal fold o Return Appointment in 1 week. Off-Loading Wound #1 Right Gluteal fold o Mattress - Please continue air mattress at SNF o Turn and reposition every 2 hours [place] #1 Right Gluteal fold o [place] [facility] [name], [name] ([id number]) o [place] Nurse may visit PRN to address patientos wound care needs. o FACE TO FACE ENCOUNTER: MEDICARE and MEDICAID PATIENTS: I certify that this patient is under my care and that I had a face-to-face encounter that meets the physician face-to-face encounter requirements with this patient on this date. The encounter with the patient was in whole or in part for the following MEDICAL CONDITION: (primary reason for [place]) MEDICAL NECESSITY: I certify, that based on my findings, NURSING services are a medically necessary home health service. HOME BOUND STATUS: I certify that my clinical findings support that this patient  is homebound (i.e., Due to illness or injury, pt requires aid of supportive devices such as crutches, cane, wheelchairs, walkers, the use of special transportation or the assistance of another person to leave their place of residence. There is a normal inability to leave the home and doing so requires considerable and taxing effort. Other absences are for medical reasons / religious services and are infrequent or of short duration when for other reasons). o If current dressing causes regression in wound condition, may D/C ordered dressing product/s and apply Normal Saline Moist Dressing daily until next [place] / Other MD appointment. [place] of regression in wound condition at [phone]. o Please direct any NON-WOUND related issues/requests for orders to patient's Primary Care Physician Negative Pressure Wound Therapy Wound #1 Right Gluteal fold o Wound VAC settings at 125/130 mmHg continuous pressure. Use BLACK/GREEN foam to wound cavity. Use WHITE foam to fill any tunnel/s and/or undermining. Change VAC dressing 3 X  WEEK. Change canister as indicated when full. Nurse may titrate settings and frequency of dressing changes as clinically indicated. - PLEASE, apply Silver Collagen to line the base of the wound. Black foam only! HHRN Please use and order for patient Duoderm to peri wound to help drape seal better. Thank you. o Home Health Nurse may d/c VAC for s/s of increased infection, significant wound regression, or uncontrolled drainage. [place] at [phone]. o Number of foam/gauze pieces used in the dressing = Electronic Signature(s) Signed: 11/23/2018 1:35:27 PM By: Montey Hora Signed: 11/23/2018 3:19:36 PM By: Worthy Keeler [name] Entered By: Montey Hora on 11/23/2018 09:50:44 [name], [name] ([id number]) -------------------------------------------------------------------------------- Problem List  Details Patient Name: [name] Date of Service: 11/23/2018 8:15 AM Medical Record Number: [id number] Patient Account Number: [id number] Date of Birth/Sex: 27-Feb-1951 (68 y.o. M) Treating RN: Montey Hora Primary Care Provider: [name] Other Clinician: Referring Provider: [name] Treating Provider/Extender: [name], HOYT Weeks in Treatment: 14 Active Problems ICD-10 Evaluated Encounter Code Description Active Date Today Diagnosis L89.314 Pressure ulcer of right buttock, stage 4 11/30/2017 No Yes L03.317 Cellulitis of buttock 06/21/2018 No Yes G82.20 Paraplegia, unspecified 11/30/2017 No Yes S34.109S Unspecified injury to unspecified level of lumbar spinal cord, 11/30/2017 No Yes sequela I10 Essential (primary) hypertension 11/30/2017 No Yes Inactive Problems Resolved Problems Electronic Signature(s) Signed: 11/23/2018 3:19:36 PM By: Worthy Keeler [name] Entered By: Worthy Keeler on 11/23/2018 08:47:09 [name] ([id number]) -------------------------------------------------------------------------------- Progress Note Details Patient Name: [name] Date of Service: 11/23/2018 8:15 AM Medical Record Number: [id number] Patient Account Number: [id number] Date of Birth/Sex: Mar 21, 1951 (68 y.o. 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 He states that there is no history of diabetes that he is aware of. He does have  issues with his prostate and is currently receiving treatment for this by way of oral medication. With that being said I do not have a lot of details in that regard. Nonetheless the patient presents today as a result of having been referred to [place] by another provider initially home health was set to come out and take care of his wound although due to the fact that he apparently drives he's not able to receive home health. His wife is therefore trying to help take care of this wound within although they have been struggling with what exactly to do at this point. She states that she can do some things but she is definitely not a nurse and does have some issues with looking at blood. The good news is the wound does not appear to be too deep and is fairly superficial at this point. There is no slough noted there is some nonviable skin noted around the surface of the wound and the perimeter at this point. The central portion of the wound appears to be very good with a dermal layer noted this does not appear to be again deep enough to extend it to subcutaneous tissue at this point. Overall the patient for a paraplegic seems to be  functioning fairly well he does have both a spinal cord stimulator as well is the intrathecal pump. In the pump he has Dilaudid and baclofen. 12/07/17 on evaluation today patient presents for follow-up concerning his ongoing lower back thigh ulcer on the right. He states that he did not get the supplies ordered and therefore has not really been able to perform the dressing changes as directed exactly. His wife was able to get some Boarder Foam Dressing's from the drugstore and subsequently has been using hydrogel which did help to a degree in the wound does appear to be able smaller. There is actually more drainage this week noted than previous. 12/21/17 on evaluation today patient appears to be doing rather well in regard to his right gluteal ulcer. He has been tolerating the  dressing changes without complication. There does not appear to be any evidence of infection at this point in time. Overall the wound does seem to be making some progress as far as the edges are concerned there's not as much in the way of overlapping of the external wound edges and he has a good epithelium to wound bed border for the most part. This however is not true right at the 12 o'clock location over the span of a little over a centimeters which actually will require debridement today to clean this away and hopefully allow it to continue to heal more appropriately. 12/28/17 on evaluation today patient appears to be doing rather well in regard to his ulcer in the left gluteal region. He's been tolerating the dressing changes without complication. Apparently he has had some difficulty getting his dressing material. Apparently there's been some confusion with ordering we're gonna check into this. Nonetheless overall he's been showing signs of improvement which is good news. Debridement is not required today. 01/04/18 on evaluation today patient presents for follow-up concerning his right gluteal ulcer. He has been tolerating the dressing changes fairly well. On inspection today it appears he may actually have some maceration them concerned about the fact that he may be developing too much moisture in and around the wound bed which can cause delay in healing. With that being said he unfortunately really has not showed significant signs of improvement since last week's evaluation in fact this may even be just the little bit/slightly larger. Nonetheless he's been having a lot of discomfort I'm not sure this is even related to the wound as he has no pain when I'm to breeding or otherwise cleaning the wound during evaluation today. Nonetheless this is something that we did recommend he talked to his pain specialist concerning. 01/11/18 on evaluation today patient appears to be doing better in regard to  his ulceration. He has been tolerating the dressing Armitage, Simone J. ([id number]) changes without complication. With that being said overall there's no evidence of infection which is good news. The only thing is he did receive the hatch affair blue classic versus the ready nonetheless I feel like this is perfectly fine and appears to have done well for him over the past week. 01/25/18 on evaluation today patient's wound actually appears to be a little bit larger than during the last evaluation. The good news is the majority of the wound edges actually appear to be fairly firmly attached to the wound bed unfortunately again we're not really making progress in regard to the size. Roughly the wound is about the same size as when I first saw him although again the wound margin/edges appear to be much better. 02/01/18  on evaluation today patient actually appears to be doing very well in regard to his wound. Applying the Prisma dry does seem to be better although he does still have issues with slow progression of the wound. There was a slight improvement compared to last week's measurements today. Nonetheless I have been considering other options as far as the possibility of Theraskin or even a snap vac. In general I'm not sure that the Theraskin due to location of the wound would be a very good idea. Nonetheless I do think that a snap vac could be a possibility for the patient and in fact I think this could even be an excellent way to manage the wound possibly seeing some improvement in a very rapid fashion here. Nonetheless this is something that we would need to get approved and I did have a lengthy conversation with the patient about this today. 02/08/18 on evaluation today patient appears to be doing a little better in regard to his ulcer. He has been tolerating the dressing changes without complication. Fortunately despite the fact that the wound is a little bit smaller it's not significantly  so unfortunately. We have discussed the possibility of a snap vac we did check with insurance this is actually covered at this point. Fortunately there does not appear to be any sign of infection. Overall I'm fairly pleased with how things seem to be appearing at this point. 02/15/18 on evaluation today patient appears to be doing rather well in regard to his right gluteal ulcer. Unfortunately the snap vac did not stay in place with his sheer and friction this came loose and did not seem to maintain seal very well. He worked for about two days and it did seem to do very well during that time according to his wife but in general this does not seem to be something that's gonna be beneficial for him long-term. I do believe we need to go back to standard dressings to see if we can find something that will be of benefit. 03/02/18- He is here in follow up evaluation; there is minimal change in the wound. He will continue with the same treatment plan, would consider changing to iodosrob/iodoflex if ulcer continues to to plateau. He will follow up next week 03/08/18 on evaluation today patient's wound actually appears to be about the same size as when I previously saw him several weeks back. Unfortunately he does have some slightly dark discoloration in the central portion of the wound which has me concerned about pressure injury. I do believe he may be sitting for too long a period of time in fact he tells me that "I probably sit for much too long". He does have some Slough noted on the surface of the wound and again as far as the size of the wound is concerned I'm really not seeing anything that seems to have improved significantly. 03/15/18 on evaluation today patient appears to be doing fairly well in regard to his ulcer. The wound measured pretty much about the same today compared to last week's evaluation when looking at his graph. With that being said the area of bruising/deep tissue injury that was  noted last week I do not see at this point. He did get a new cushion fortunately this does seem to be have been of benefit in my pinion. It does appear that he's been off of this more which is good news as well I think that is definitely showing in the overall wound measurements. With that  being said I do believe that he needs to continue to offload I don't think that the fact this is doing better should be or is going to allow him to not have to offload and explain this to him as well. Overall he seems to be in agreement the plan I think he understands. The overall appearance of the wound bed is improved compared to last week I think the Iodoflex has been beneficial in that regard. 03/29/18 on evaluation today patient actually appears to be doing rather well in regard to his wound from the overall appearance standpoint he does have some granulation although there's some Slough on the surface of the wound noted as well. With that being said he unfortunately has not improved in regard to the overall measurement of the wound in volume or in size. I did have a discussion with him very specifically about offloading today. He actually does work although he mainly is just sitting throughout the day. He tells me he offloads by "lifting himself up for 30 seconds off of his chair occasionally" purchase from [facility] which does seem to have helped. And he has a new cushion that he with that being said he's also able to stand some for a very short period of time but not significant enough I think to provide appropriate offloading. I think the biggest issue at this point with the wound and the fact is not healing as quickly as we would like is due to the fact that he is really not able to appropriately offload while at work. He states the beginning after his injury he actually had a bed at his job that he could lay on in order to offload and that does seem to have been of help back at that time.  Nonetheless he had not done this in quite some time unfortunately. I think that could be helpful for him this is something I would like for him to look into. [name], [name] ([id number]) 04/05/18 on evaluation today patient actually presents for follow-up concerning his right gluteal ulcer. Again he really is not significantly improved even compared to last week. He has been tolerating the dressing changes without complication. With that being said fortunately there appears to be no evidence of infection at this time. He has been more proactive in trying to offload. 04/12/18 on evaluation today patient actually appears to be doing a little better in regard to his wound and the right gluteal fold region. He's been tolerating the dressing changes since removing the oasis without complication. However he was having a lot of burning initially with the oasis in place. He's unsure of exactly why this was given so much discomfort but he assumes that it was the oasis itself causing the problem. Nonetheless this had to be removed after about three days in place although even those three days seem to have made a fairly good improvement in regard to the overall appearance of the wound bed. In fact is the first time that he's made any improvement from the standpoint of measurements in about six weeks. He continues to have no discomfort over the area of the wound itself which leads me to wonder why he was having the burning with the oasis when he does not even feel the actual debridement's themselves. I am somewhat perplexed by this. 04/19/18 on evaluation today patient's wound actually appears to be showing signs of epithelialization around the edge of the wound and in general actually appears to be doing better  which is good news. He did have the same burning after about three days with applying the Endoform last week in the same fashion that I would generally apply a skin substitute. This seems to indicate  that it's not the oasis to cause the problem but potentially the moisture buildup that just causes things to burn or there may be some other reaction with the skin prep or Steri-Strips. Nonetheless I'm not sure that is gonna be able to tolerate any skin substitute for a long period of time. The good news is the wound actually appears to be doing better today compared to last week and does seem to finally be making some progress. 04/26/18 on evaluation today patient actually appears to be doing rather well in regard to his ulcer in the right gluteal fold. He has been tolerating the dressing changes without complication which is good news. The Endoform does seem to be helping although he was a little bit more macerated this week. This seems to be an ongoing issue with fluid control at this point. Nonetheless I think we may be able to add something like Drawtex to help control the drainage. 05/03/18 on evaluation today patient appears to actually be doing better in regard to the overall appearance of his wound. He has been tolerating the dressing changes without complication. Fortunately there appears to be no evidence of infection at this time. I really feel like his wound has shown signs as of today of turning around last week I thought so as well and definitely he could be seen in this week's overall appearance and measurements. In general I'm very pleased with the fact that he finally seems to be making a steady but sure progress. The patient likewise is very pleased. 05/17/18 on evaluation today patient appears to be doing more poorly unfortunately in regard to his ulcer. He has been tolerating the dressing changes without complication. With that being said he tells me that in the past couple of days he and his wife have noticed that we did not seem to be doing quite as well is getting dark near the center. Subsequently upon evaluation today the wound actually does appear to be doing worse compared  to previous. He has been tolerating the dressing changes otherwise and he states that he is not been sitting up anymore than he was in the past from what he tells me. Still he has continued to work he states "I'm tired of dealing with this and if I have to just go home and lay in the bed all the time that's what I'll do". Nonetheless I am concerned about the fact that this wound does appear to be deeper than what it was previous. 05/24/18 upon evaluation today patient actually presents after having been in the hospital due to what was presumed to be sepsis secondary to the wound infection. He had an elevated white blood cell count between 14 and 15. With that being said he does seem to be doing somewhat better now. His wound still is giving him some trouble nonetheless and he is obviously concerned about the fact likely talked about that this does seem to go more deeply than previously noted. I did review his wound culture which showed evidence of Staphylococcus aureus him and group B strep. Nonetheless he is on antibiotics, Levaquin, for this. Subsequently I did review his intake summary from the hospital as well. I also did look at the CT of the lumbar spine with contrast that was performed which  showed no bone destruction to suggest lumbar disguises/osteomyelitis or sacral osteomyelitis. There was no paraspinal abscess. Nonetheless it appears this may have been more of just a soft tissue infection at this point which is good news. He still is nonetheless concerned about the wound which again I think is completely reasonable considering everything he's been through recently. 05/31/18 on evaluation today on evaluation today patient actually appears to be showing signs of his wound be a little bit deeper than what I would like to see. Fortunately he does not show any signs of significant infection although his temperature was 99 today he states he's been checking this at home and has not been  elevated. Nonetheless with the undermining that I'm seeing at this point I am becoming more concerned about the wound I do think that offloading is a key factor here that is preventing the speedy recovery at this point. There does not appear to be any evidence of again over infection noted. He's been using Santyl currently. [name], [name] ([id number]) 06/07/18 the patient presents today for follow-up evaluation regarding the left ulcer in the gluteal region. He has been tolerating the Wound VAC fairly well. He is obviously very frustrated with this he states that to mean is really getting in his way. There does not appear to be any evidence of infection at this time he does have a little bit of odor I do not necessarily associate this with infection just something that we sometimes notice with Wound VAC therapy. With that being said I can definitely catch a tone of discontentment overall in the patient's demeanor today. This when he was previously in the hospital an CT scan was done of the lumbar region which did not reveal any signs of osteomyelitis. With that being said the pelvis in particular was not evaluated distinctly which means he could still have some osteonecrosis I. Nonetheless the Wound VAC was started on Thursday I do want to get this little bit more time before jumping to a CT scan of the pelvis although that is something that I might would recommend if were not see an improvement by that time. 06/14/18 on evaluation today patient actually appears to be doing about the same in regard to his right gluteal ulcer. Again he did have a CT scan of the lumbar spine unfortunately this did not include the pelvis. Nonetheless with the depth of the wound that I'm seeing today even despite the fact that I'm not seeing any evidence of overt cellulitis I believe there's a good chance that we may be dealing with osteomyelitis somewhere in the right Ischial region. No fevers, chills, nausea, or  vomiting noted at this time. 06/21/18 on evaluation today patient actually appears to be doing about the same with regard to his wound. The tunnel at 6 o'clock really does not appear to be any deeper although it is a little bit wider. I think at this point you may want to start packing this with white phone. Unfortunately I have not got approval for the CT scan of the pelvis as of yet due to the fact that Medicare apparently has been denied it due to the diagnosis codes not being appropriate according to Medicare for the test requested. With that being said the patient cannot have an MRI and therefore this is the only option that we have as far as testing is concerned. The patient has had infection and was on antibiotics and been added code for cellulitis of the bottom to see  if this will be appropriate for getting the test approved. Nonetheless I'm concerned about the infection have been spread deeper into the Ischial region. 06/28/18 on evaluation today patient actually appears to be doing rather well all things considered in regard to the right gluteal ulcer. He has been tolerating the dressing changes without complication. With that being said the Wound VAC he states does have to be replaced almost every day or at least reinforced unfortunately. Patient actually has his CT scan later this morning we should have the results by tomorrow. 07/05/18 on evaluation today patient presents for follow-up concerning his right Ischial ulcer. He did see the surgeon Dr. Lysle Pearl last week. They were actually very happy with him and felt like he spent a tremendous amount of time with them as far as discussing his situation was concerned. In the end Dr. Lysle Pearl did contact me as well and determine that he would not recommend any surgical intervention at this point as he felt like it would not be in the patient's best interest based on what he was seeing. He recommended a referral to infectious disease. Subsequently  this is something that Dr. Ines Bloomer office is working on setting up for the patient. As far as evaluation today is concerned the patient's wound actually appears to be worse at this point. I am concerned about how things are progressing and specifically about infection. I do not feel like it's the deeper but the area of depth is definitely widening which does have me concerned. No fevers, chills, nausea, or vomiting noted at this time. I think that we do need initiate antibiotic therapy the patient has an allow allergy to amoxicillin/penicillin he states that he gets a rash since childhood. Nonetheless she's never had the issues with Catholics or cephalosporins in general but he is aware of. 07/27/18 on evaluation today patient presents following admission to the hospital on 07/09/18. He was subsequently discharged on 07/20/18. On 07/15/18 the patient underwent irrigation and debridement was soft tissue biopsy and bone biopsy as well as placement of a Wound VAC in the OR by Dr. Celine Ahr. During the hospital course the patient was placed on a Wound VAC and recommended follow up with surgery in three weeks actually with Dr. Delaine Lame who is infectious disease. The patient was on vancomycin during the hospital course. 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 He also had a bone culture which revealed evidence of methicillin-resistant staph aureus. He is updated CT scan 07/09/18 reveals that he had progression of the which was performed on wound to breakdown down to the trochanter where he actually had irregularities there as well suggestive of osteomyelitis. This was a change just since 9 December when we last performed a CT scan. Obviously this one had gone downhill quite significantly and rapidly. At this point upon evaluation I feel like in general the patient's wound seems to be doing fairly well all things considered upon my evaluation today. Obviously this is larger  and deeper than what I previously evaluated but at the same time he seems to be making some progress as far as the appearance of the granulation tissue is concerned. I'm happy in that regard. No fevers, chills, nausea, or vomiting noted at this time. He is on IV vancomycin and Rocephin at the facility. He is currently in [facility]. 08/03/18 upon evaluation today patient's wound appears to be doing better in regard to the overall appearance at this point in time. Fortunately he's been  tolerating the Wound VAC without complication and states that the facility has been taking [name], [name]. ([id number]) excellent care of the wound site. Overall I see some Slough noted on the surface which I am going to attempt sharp debridement today of but nonetheless other than this I feel like he's making progress. 08/09/18 on evaluation today patient's wound appears to be doing much better compared to even last week's evaluation. Do believe that the Wound VAC is been of great benefit for him. He has been tolerating the dressing changes that is the Wound VAC without any complication and he has excellent granulation noted currently. There is no need for sharp debridement at this point. 08/16/18 on evaluation today patient actually appears to be doing very well in regard to the wound in the right gluteal fold region. This is showing signs of progress and again appears to be very healthy which is excellent news. Fortunately there is no sign of active infection by way of odor or drainage at this point. Overall I'm very pleased with how things stand. He seems to be tolerating the Wound VAC without complication. 08/23/18 on evaluation today patient actually appears to be doing better in regard to his wound. He has been tolerating the Wound VAC without complication and in fact it has been collecting a significant amount of drainage which I think is good news especially considering how the wound appears.  Fortunately there is no signs of infection at this time definitely nothing appears to be worse which is good news. He has not been started on the Bactrim and Flagyl that was recommended by Dr. Delaine Lame yet. I did actually contact her office this morning in order to check and see were things are that regard their gonna be calling me back. 08/30/18 on evaluation today patient actually appears to show signs of excellent improvement today compared to last evaluation. The undermining is getting much better the wound seems to be feeling quite nicely and I'm very pleased that the granulation in general. With that being said overall I feel like the patient has made excellent progress which is great news. No fevers, chills, nausea, or vomiting noted at this time. 09/06/18 on evaluation today patient actually appears to be doing rather well in regard to his right gluteal ulcer. This is showing signs of improvement in overall I'm very pleased with how things seem to be progressing. The patient likewise is please. Overall I see no evidence of infection he is about to complete his oral antibiotic regimen which is the end of the antibiotics for him in just about three days. 09/13/18 on evaluation today patient's right Ischial ulcer appears to be showing signs of continued improvement which is excellent news. He's been tolerating the dressing changes without complication. Fortunately there's no signs of infection and the wound that seems to be doing very well. 09/28/18 on evaluation today patient appears to be doing rather well in regard to his right Ischial ulcer. He's been tolerating the Wound VAC without complication he knows there's much less drainage than there used to be this obviously is not a bad thing in my pinion. There's no evidence of infection despite the fact is but nothing about it now for several weeks. 10/04/18 on evaluation today patient appears to be doing better in regard to his right Ischial  wound. He has been tolerating the Wound VAC without complication and I do believe that the silver nitrate last week was beneficial for him. Fortunately overall there's no evidence of  active infection at this time which is great news. No fevers, chills, nausea, or vomiting noted at this time. 10/11/18 on evaluation today patient actually appears to be doing rather well in regard to his Ischial ulcer. He's been tolerating the Wound VAC still without complication I feel like this is doing a good job. No fevers, chills, nausea, or vomiting noted at this time. 11/01/18 on evaluation today patient presents after having not been seen in our clinic for several weeks secondary to the fact that he was on evaluation today patient presents after having not been seen in our clinic for several weeks secondary to the fact that he was in a skilled nursing facility which was on lockdown currently due to the covert 19 national emergency. Subsequently he was discharged from the facility on this past Friday and subsequently made an appointment to come in to see yesterday. Fortunately there's no signs of active infection at this time which is good news and overall he does seem to have made progress since I last saw. Overall I feel like things are progressing quite nicely. The patient is having no pain. 11/08/18 on evaluation today patient appears to be doing okay in regard to his right gluteal ulcer. He has been utilizing a Wound VAC home health this changing this at this point since he's home from the skilled nursing facility. Fortunately there's no signs of obvious active infection at this time. Unfortunately though there's no obvious active infection he is having some maceration and his wife states that when the sheets of the Wound VAC office on Sunday when it broke seal that he ended up having significant issues with some smell as well there concerned about the possibility of infection. Fortunately there's No fevers,  chills, nausea, or vomiting noted at this time. [name], [name] ([id number]) 11/15/18 on evaluation today patient actually appears to be doing well in regard to his right gluteal ulcer. He has been tolerating the dressing changes without complication. Specifically the Wound VAC has been utilized up to this point. Fortunately there's no signs of infection and overall I feel like he has made progress even since last week when I last saw him. I'm actually fairly happy with the overall appearance although he does seem to have somewhat of a hyper granular overgrowth in the central portion of the wound which I think may require some sharp debridement to try flatness out possibly utilizing chemical cauterization following. 11/23/18 on evaluation today patient actually appears to be doing very well in regard to his sacral ulcer. He seems to be showing signs of improvement with good granulation. With that being said he still has the small area of hyper granulation right in the central portion of the wound which I'm gonna likely utilize silver nitrate on today. Subsequently he also keeps having a leak at the 6 o'clock location which is unfortunate we may be able to help out with some suggestions to try to prevent this going forward. Fortunately there's no signs of active infection at this time. Patient History Information obtained from Patient. Family History Hypertension - Father, Stroke - Mother, No family history of Cancer, Diabetes, Heart Disease, Kidney Disease, Lung Disease, Seizures, Thyroid Problems, Tuberculosis. Social History Never smoker, Marital Status - Married, Alcohol Use - Never, Drug Use - No History, Caffeine Use - Daily. Medical History Eyes Patient has history of Cataracts - both removed Denies history of Glaucoma, Optic Neuritis Ear/Nose/Mouth/Throat Denies history of Chronic sinus problems/congestion, Middle ear problems Hematologic/Lymphatic Denies history of  Anemia,  Hemophilia, Human Immunodeficiency Virus, Lymphedema Respiratory Denies history of Aspiration, Asthma, Chronic Obstructive Pulmonary Disease (COPD), Pneumothorax, Sleep Apnea, Tuberculosis Cardiovascular Patient has history of Hypertension - takes medication Denies history of Angina, Arrhythmia, Congestive Heart Failure, Coronary Artery Disease, Deep Vein Thrombosis, Hypotension, Myocardial Infarction, Peripheral Arterial Disease, Peripheral Venous Disease, Phlebitis, Vasculitis Gastrointestinal Denies history of Cirrhosis , Colitis, Crohn s, Hepatitis A, Hepatitis B, Hepatitis C Endocrine Denies history of Type I Diabetes, Type II Diabetes Genitourinary Denies history of End Stage Renal Disease Immunological Denies history of Lupus Erythematosus, Raynaud s, Scleroderma Integumentary (Skin) Denies history of History of Burn, History of pressure wounds Musculoskeletal Denies history of Gout, Rheumatoid Arthritis, Osteoarthritis, Osteomyelitis Neurologic Patient has history of Paraplegia - waist down Denies history of Dementia, Neuropathy, Quadriplegia, Seizure Disorder Oncologic Denies history of Received Chemotherapy, Received Radiation Psychiatric Denies history of Anorexia/bulimia, Confinement Anxiety [name], [name] ([id number]) Medical And Surgical History Notes Oncologic Prostate cancer- currently treated with horomone therapy Review of Systems (ROS) Constitutional Symptoms (General Health) Denies complaints or symptoms of Fatigue, Fever, Chills, Marked Weight Change. Respiratory Denies complaints or symptoms of Chronic or frequent coughs, Shortness of Breath. Cardiovascular Denies complaints or symptoms of Chest pain, LE edema. Psychiatric Denies complaints or symptoms of Anxiety, Claustrophobia. Objective Constitutional Well-nourished and well-hydrated in no acute distress. Vitals Time Taken: 8:53 AM, Height: 73 in, Weight: 210 lbs, BMI: 27.7, Temperature: 98.5  F, Pulse: 80 bpm, Respiratory Rate: 16 breaths/min, Blood Pressure: 133/80 mmHg. Respiratory normal breathing without difficulty. clear to auscultation bilaterally. Cardiovascular regular rate and rhythm with normal S1, S2. Psychiatric this patient is able to make decisions and demonstrates good insight into disease process. Alert and Oriented x 3. pleasant and cooperative. General Notes: Upon inspection today patient's wound bed actually shows evidence of good granulation at this time there does not appear to be any signs of active infection. I did utilize one take a silver nitrate complaint catalyze the central portion of the wound where you have some hyper granular tissue I may have to do this several times going forward over the next several weeks to try to help flatness out. Fortunately there's no evidence of active infection at this time. Integumentary (Hair, Skin) Wound #1 status is Open. Original cause of wound was Pressure Injury. The wound is located on the Right Gluteal fold. The wound measures 3cm length x 2cm width x 3.2cm depth; 4.712cm^2 area and 15.08cm^3 volume. There is muscle and Fat Layer (Subcutaneous Tissue) Exposed exposed. There is tunneling at 12:00 with a maximum distance of 4.6cm. There is a large amount of serous drainage noted. The wound margin is epibole. There is large (67-100%) red, pink, hyper - granulation within the wound bed. There is a small (1-33%) amount of necrotic tissue within the wound bed including Adherent Slough. The periwound skin appearance exhibited: Rash, Maceration. The periwound skin appearance did not exhibit: Callus, Crepitus, Excoriation, Induration, Scarring, Dry/Scaly, Atrophie Blanche, Cyanosis, Ecchymosis, Hemosiderin Staining, Mottled, Pallor, Rubor, Erythema. Periwound temperature was noted as No Abnormality. The periwound has tenderness on palpation. 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 An CHEM CAUT GRANULATION TISS procedure was performed by [name], HOYT E., [name]. Post procedure Diagnosis Wound #1: Same as Pre-Procedure  Notes: 1 stick of silver nitrate used Plan Wound Cleansing: Wound #1 Right Gluteal fold: Cleanse wound with mild soap and water Anesthetic (add to Medication List): Wound #1 Right Gluteal fold: Topical Lidocaine 4% cream applied to wound bed prior to debridement (In Clinic Only). Primary Wound Dressing: Wound #1 Right Gluteal fold: Dry Gauze - wet to dry in clinic until [facility] restarts NPWT Secondary Dressing: Wound #1 Right Gluteal fold: Boardered Foam Dressing - in clinic until [facility] restarts NPWT Dressing Change Frequency: Wound #1 Right Gluteal fold: Change Dressing Monday, Wednesday, Friday - and as needed Follow-up Appointments: Wound #1 Right Gluteal fold: Return Appointment in 1 week. Off-Loading: Wound #1 Right Gluteal fold: Mattress - Please continue air mattress at SNF Turn and reposition every 2 hours Home Health: Wound #1 Right Gluteal fold: [place] Nurse may visit PRN to address patient s wound care needs. RITO, LECOMTE ([id number]) FACE TO FACE ENCOUNTER: MEDICARE and MEDICAID PATIENTS: I certify that this patient is under my care and that I had a face-to-face encounter that meets the physician face-to-face encounter requirements with this patient on this date. The encounter with the patient was in whole or in part for the following MEDICAL CONDITION: (primary reason for [place]) MEDICAL NECESSITY: I certify, that based on my findings, NURSING services are a medically necessary home health  service. HOME BOUND STATUS: I certify that my clinical findings support that this patient is homebound (i.e., Due to illness or injury, pt requires aid of supportive devices such as crutches, cane, wheelchairs, walkers, the use of special transportation or the assistance of another person to leave their place of residence. There is a normal inability to leave the home and doing so requires considerable and taxing effort. Other absences are for medical reasons / religious services and are infrequent or of short duration when for other reasons). If current dressing causes regression in wound condition, may D/C ordered dressing product/s and apply Normal Saline Moist Dressing daily until next [place] / Other MD appointment. [place] of regression in wound condition at [phone]. Please direct any NON-WOUND related issues/requests for orders to patient's Primary Care Physician Negative Pressure Wound Therapy: Wound #1 Right Gluteal fold: Wound VAC settings at 125/130 mmHg continuous pressure. Use BLACK/GREEN foam to wound cavity. Use WHITE foam to fill any tunnel/s and/or undermining. Change VAC dressing 3 X WEEK. Change canister as indicated when full. Nurse may titrate settings and frequency of dressing changes as clinically indicated. - PLEASE, apply Silver Collagen to line the base of the wound. Black foam only! HHRN Please use and order for patient Duoderm to peri wound to help drape seal better. Thank you. Home Health Nurse may d/c VAC for s/s of increased infection, significant wound regression, or uncontrolled drainage. [place] at [phone]. Number of foam/gauze pieces used in the dressing = My suggestion is gonna be that we go ahead and continue with the above wound care measures for the next week. Patient is in agreement with plan. We are gonna add doing arm at the 6 o'clock location to try to help prevent any areas of leakage  from forming which I think can be beneficial for him as well. We will see were things stand in one weeks time. If anything changes or worsens meantime he will contact the office and let me know. Please see above for specific wound care orders. We will see patient for re-evaluation in 1 week(s)  here in the clinic. If anything worsens or changes patient will contact our office for additional recommendations. Electronic Signature(s) Signed: 11/23/2018 3:19:36 PM By: Worthy Keeler [name] Entered By: Worthy Keeler on 11/23/2018 14:33:23 HARWOOD, NALL ([id number]) -------------------------------------------------------------------------------- ROS/PFSH Details Patient Name: [name] Date of Service: 11/23/2018 8:15 AM Medical Record Number: [id number] Patient Account Number: [id number] Date of Birth/Sex: 03/02/1951 (68 y.o. M) Treating RN: Montey Hora Primary Care Provider: [name] Other Clinician: Referring Provider: [name] Treating Provider/Extender: [name], HOYT Weeks in Treatment: 47 Information Obtained From Patient Constitutional Symptoms (General Health) Complaints and Symptoms: Negative for: Fatigue; Fever; Chills; Marked Weight Change Respiratory Complaints and Symptoms: Negative for: Chronic or frequent coughs; Shortness of Breath Medical History: Negative for: Aspiration; Asthma; Chronic Obstructive Pulmonary Disease (COPD); Pneumothorax; Sleep Apnea; Tuberculosis Cardiovascular Complaints and Symptoms: Negative for: Chest pain; LE edema Medical History: Positive for: Hypertension - takes medication Negative for: Angina; Arrhythmia; Congestive Heart Failure; Coronary Artery Disease; Deep Vein Thrombosis; Hypotension; Myocardial Infarction; Peripheral Arterial Disease; Peripheral Venous Disease; Phlebitis; Vasculitis Psychiatric Complaints and Symptoms: Negative for: Anxiety; Claustrophobia Medical History: Negative for:  Anorexia/bulimia; Confinement Anxiety Eyes Medical History: Positive for: Cataracts - both removed Negative for: Glaucoma; Optic Neuritis Ear/Nose/Mouth/Throat Medical History: Negative for: Chronic sinus problems/congestion; Middle ear problems Hematologic/Lymphatic Medical History: Negative for: Anemia; Hemophilia; Human Immunodeficiency Virus; Lymphedema SEM, MCCAUGHEY. ([id number]) Gastrointestinal Medical History: Negative for: Cirrhosis ; Colitis; Crohnos; Hepatitis A; Hepatitis B; Hepatitis C Endocrine Medical History: Negative for: Type I Diabetes; Type II Diabetes Genitourinary Medical History: Negative for: End Stage Renal Disease Immunological Medical History: Negative for: Lupus Erythematosus; Raynaudos; Scleroderma Integumentary (Skin) Medical History: Negative for: History of Burn; History of pressure wounds Musculoskeletal Medical History: Negative for: Gout; Rheumatoid Arthritis; Osteoarthritis; Osteomyelitis Neurologic Medical History: Positive for: Paraplegia - waist down Negative for: Dementia; Neuropathy; Quadriplegia; Seizure Disorder Oncologic Medical History: Negative for: Received Chemotherapy; Received Radiation Past Medical History Notes: Prostate cancer- currently treated with horomone therapy HBO Extended History Items Eyes: Cataracts Immunizations Pneumococcal Vaccine: Received Pneumococcal Vaccination: No Implantable Devices No devices added Family and Social History Cancer: No; Diabetes: No; Heart Disease: No; Hypertension: Yes - Father; Kidney Disease: No; Lung Disease: No; Seizures: No; Stroke: Yes - Mother; Thyroid Problems: No; Tuberculosis: No; Never smoker; Marital Status - Married; Alcohol Use: Never; Drug Use: No History; Caffeine Use: Daily KOLBIE, LEPKOWSKI ([id number]) Physician Affirmation I have reviewed and agree with the above information. Electronic Signature(s) Signed: 11/23/2018 3:19:36 PM By: Worthy Keeler  [name] Signed: 11/24/2018 4:44:18 PM By: Montey Hora Entered By: Worthy Keeler on 11/23/2018 14:32:15 HARL, WIECHMANN ([id number]) -------------------------------------------------------------------------------- [place] Details Patient Name: [name] Date of Service: 11/23/2018 Medical Record Number: [id number] Patient Account Number: [id number] Date of Birth/Sex: 09/09/1950 (68 y.o. M) Treating RN: Montey Hora Primary Care Provider: [name] Other Clinician: Referring Provider: [name] Treating Provider/Extender: [name], HOYT Weeks in Treatment: 51 Diagnosis Coding ICD-10 Codes Code Description L89.314 Pressure ulcer of right buttock, stage 4 L03.317 Cellulitis of buttock G82.20 Paraplegia, unspecified S34.109S Unspecified injury to unspecified level of lumbar spinal cord, sequela I10 Essential (primary) hypertension Facility Procedures CPT4 Code: 89211941 Description: 74081 - CHEM CAUT GRANULATION TISS ICD-10 Diagnosis Description L89.314 Pressure ulcer of right buttock, stage 4 Modifier: Quantity: 1 Physician Procedures CPT4 Code: 4481856 Description: 31497 - WC PHYS CHEM CAUT GRAN TISSUE ICD-10 Diagnosis Description L89.314 Pressure ulcer of right buttock, stage 4 Modifier: Quantity: 1 Electronic Signature(s) Signed: 11/23/2018 3:19:36 PM  By: Worthy Keeler [name] Entered By: Worthy Keeler on 11/23/2018 14:38:42

## 2018-11-29 ENCOUNTER — Encounter: Payer: Medicare Other | Admitting: Physician Assistant

## 2018-11-29 ENCOUNTER — Other Ambulatory Visit: Payer: Self-pay

## 2018-11-29 DIAGNOSIS — L89314 Pressure ulcer of right buttock, stage 4: Secondary | ICD-10-CM | POA: Diagnosis not present

## 2018-11-29 NOTE — Progress Notes (Signed)
[name], [name] ([id number]) Visit Report for 11/29/2018 Arrival Information Details Patient Name: [name], [name] Date of Service: 11/29/2018 8:15 AM Medical Record Number: [id number] Patient Account Number: [id number] Date of Birth/Sex: 1951-04-02 (68 y.o. M) Treating RN: [name] Primary Care [name]: [name] Other Clinician: Referring [name]: [name] Treating Ileigh Mettler/Extender: [name], [name] Weeks in Treatment: 32 Visit Information History Since Last Visit Added or deleted any medications: No Patient Arrived: Wheel Chair Any new allergies or adverse reactions: No Arrival Time: 08:17 Had a fall or experienced change in No Accompanied By: wife activities of daily living that may affect Transfer Assistance: Manual risk of falls: Patient Identification Verified: Yes Signs or symptoms of abuse/neglect since last visito No Secondary Verification Process Completed: Yes Hospitalized since last visit: No Patient Requires Transmission-Based No Has Dressing in Place as Prescribed: Yes Precautions: Pain Present Now: Yes Patient Has Alerts: Yes Patient Alerts: NOT Diabetic Electronic Signature(s) Signed: 11/29/2018 5:03:03 PM By: Gretta Cool, BSN, RN, CWS, Kim RN, BSN Entered By: Gretta Cool, BSN, RN, CWS, Kim on 11/29/2018 08:18:32 [name], [name] ([id number]) -------------------------------------------------------------------------------- Encounter Discharge Information Details Patient Name: [name] Date of Service: 11/29/2018 8:15 AM Medical Record Number: [id number] Patient Account Number: [id number] Date of Birth/Sex: 05-Jan-1951 (68 y.o. M) Treating RN: [name] Primary Care Jeiden Daughtridge: [name] Other Clinician: Referring Lotus Santillo: [name] Treating Kadeem Hyle/Extender: [name], [name] Weeks in Treatment: 48 Encounter Discharge Information Items Discharge Condition: Stable Ambulatory Status: Wheelchair Discharge  Destination: Home Transportation: Private Auto Accompanied By: wife Schedule Follow-up Appointment: Yes Clinical Summary of Care: Electronic Signature(s) Signed: 11/29/2018 4:53:18 PM By: [name] Entered By: [name] on 11/29/2018 08:51:52 [name] ([id number]) -------------------------------------------------------------------------------- Lower Extremity Assessment Details Patient Name: [name] Date of Service: 11/29/2018 8:15 AM Medical Record Number: [id number] Patient Account Number: [id number] Date of Birth/Sex: [birth year] (68 y.o. M) Treating RN: [name] Primary Care Avanthika Dehnert: [name] Other Clinician: Referring Advait Buice: [name] Treating Blaike Vickers/Extender: [name] in Treatment: 23 Electronic Signature(s) Signed: 11/29/2018 5:03:03 PM By: Gretta Cool, BSN, RN, CWS, Kim RN, BSN Entered By: Gretta Cool, BSN, RN, CWS, Kim on 11/29/2018 08:25:29 YARED, BAREFOOT ([id number]) -------------------------------------------------------------------------------- Multi Wound Chart Details Patient Name: [name] Date of Service: 11/29/2018 8:15 AM Medical Record Number: [id number] Patient Account Number: [id number] Date of Birth/Sex: 1950/09/15 (68 y.o. M) Treating RN: [name] Primary Care Dinita Migliaccio: [name] Other Clinician: Referring Jowanda Heeg: [name] Treating Shaily Librizzi/Extender: [name], [name] Weeks in Treatment: 52 Vital Signs Height(in): 73 Pulse(bpm): 82 Weight(lbs): 210 Blood Pressure(mmHg): 159/81 Body Mass Index(BMI): 28 Temperature(F): 98.2 Respiratory Rate 16 (breaths/min): Photos: [N/A:N/A] Wound Location: Right Gluteal fold N/A N/A Wounding Event: Pressure Injury N/A N/A Primary Etiology: Pressure Ulcer N/A N/A Comorbid History: Cataracts, Hypertension, N/A N/A Paraplegia Date Acquired: 11/02/2017 N/A N/A Weeks of Treatment: 52 N/A N/A Wound Status: Open N/A  N/A Measurements L x W x D 2.5x2x3.2 N/A N/A (cm) Area (cm) : 3.927 N/A N/A Volume (cm) : 12.566 N/A N/A % Reduction in Area: 60.90% N/A N/A % Reduction in Volume: -1150.30% N/A N/A Position 1 (o'clock): 2 Maximum Distance 1 (cm): 5.4 Tunneling: Yes N/A N/A Classification: Category/Stage IV N/A N/A Exudate Amount: Large N/A N/A Exudate Type: Sanguinous N/A N/A Exudate Color: red N/A N/A Wound Margin: Epibole N/A N/A Granulation Amount: Large (67-100%) N/A N/A Granulation Quality: Red, Pink, Hyper-granulation N/A N/A Necrotic Amount: Small (1-33%) N/A N/A Exposed Structures: Fat Layer (Subcutaneous N/A N/A Tissue) Exposed: Yes Muscle: Yes Fascia: No  [name], [name] ([id number]) Tendon: No Joint: No Bone: No Epithelialization: None N/A N/A Periwound Skin Texture: Excoriation: No N/A N/A Induration: No Callus: No Crepitus: No Rash: No Scarring: No Periwound Skin Moisture: Maceration: Yes N/A N/A Dry/Scaly: No Periwound Skin Color: Erythema: Yes N/A N/A Atrophie Blanche: No Cyanosis: No Ecchymosis: No Hemosiderin Staining: No Mottled: No Pallor: No Rubor: No Erythema Location: Circumferential N/A N/A Temperature: No Abnormality N/A N/A Tenderness on Palpation: Yes N/A N/A Treatment Notes Electronic Signature(s) Signed: 11/29/2018 4:53:18 PM By: [name] Entered By: [name] on 11/29/2018 08:33:23 [name] ([id number]) -------------------------------------------------------------------------------- [place] Details Patient Name: [name] Date of Service: 11/29/2018 8:15 AM Medical Record Number: [id number] Patient Account Number: [id number] Date of Birth/Sex: 1950/08/31 (68 y.o. M) Treating RN: [name] Primary Care Khayri Kargbo: [name] Other Clinician: Referring Klani Caridi: [name] Treating Yeraldin Litzenberger/Extender: [name], [name] Weeks in Treatment: 87 Active Inactive Orientation  to the Wound Care Program Nursing Diagnoses: Knowledge deficit related to the wound healing center program Goals: Patient/caregiver will verbalize understanding of the [place] Program Date Initiated: 11/30/2017 Target Resolution Date: 12/21/2017 Goal Status: Active Interventions: Provide education on orientation to the wound center Notes: Pressure Nursing Diagnoses: Knowledge deficit related to management of pressures ulcers Goals: Patient/caregiver will verbalize understanding of pressure ulcer management Date Initiated: 05/17/2018 Target Resolution Date: 05/28/2018 Goal Status: Active Interventions: Assess: immobility, friction, shearing, incontinence upon admission and as needed Notes: Wound/Skin Impairment Nursing Diagnoses: Impaired tissue integrity Goals: Patient/caregiver will verbalize understanding of skin care regimen Date Initiated: 11/30/2017 Target Resolution Date: 12/21/2017 Goal Status: Active Ulcer/skin breakdown will have a volume reduction of 30% by week 4 Date Initiated: 11/30/2017 Target Resolution Date: 12/21/2017 Goal Status: Active JERRALD, DOVERSPIKE ([id number]) Interventions: Assess patient/caregiver ability to obtain necessary supplies Assess patient/caregiver ability to perform ulcer/skin care regimen upon admission and as needed Assess ulceration(s) every visit Treatment Activities: Skin care regimen initiated : 11/30/2017 Notes: Electronic Signature(s) Signed: 11/29/2018 4:53:18 PM By: [name] Entered By: [name] on 11/29/2018 08:33:14 [name] ([id number]) -------------------------------------------------------------------------------- Pain Assessment Details Patient Name: [name] Date of Service: 11/29/2018 8:15 AM Medical Record Number: [id number] Patient Account Number: [id number] Date of Birth/Sex: 1950-12-29 (68 y.o. M) Treating RN: [name] Primary Care Aloysious Vangieson: [name] Other  Clinician: Referring Naiara Lombardozzi: [name] Treating Xara Paulding/Extender: [name], [name] Weeks in Treatment: 78 Active Problems Location of Pain Severity and Description of Pain Patient Has Paino Yes Site Locations Pain Location: Pain in Ulcers With Dressing Change: Yes Rate the pain. Current Pain Level: 8 Character of Pain Describe the Pain: Exhausting, Sharp, Tender, Throbbing Pain Management and Medication Current Pain Management: Electronic Signature(s) Signed: 11/29/2018 5:03:03 PM By: Gretta Cool, BSN, RN, CWS, Kim RN, BSN Entered By: Gretta Cool, BSN, RN, CWS, Kim on 11/29/2018 08:18:51 [name], [name] ([id number]) -------------------------------------------------------------------------------- Patient/Caregiver Education Details Patient Name: [name] Date of Service: 11/29/2018 8:15 AM Medical Record Number: [id number] Patient Account Number: [id number] Date of Birth/Gender: October 12, 1950 (68 y.o. M) Treating RN: [name] Primary Care Physician: [name] Other Clinician: Referring Physician: Tracie [name] Treating Physician/Extender: [name] in Treatment: 93 Education Assessment Education Provided To: Patient Education Topics Provided Pressure: Handouts: Pressure Ulcers: Care and Offloading Methods: Demonstration, Explain/Verbal Responses: State content correctly Wound/Skin Impairment: Handouts: Caring for Your Ulcer Methods: Demonstration, Explain/Verbal Responses: State content correctly Electronic Signature(s) Signed: 11/29/2018 4:53:18 PM By: [name] Entered By: [name] on 11/29/2018 08:33:52 [name], [name] ([id number]) -------------------------------------------------------------------------------- Wound  Assessment Details Patient Name: [name], GUTRIDGE Date of Service: 11/29/2018 8:15 AM Medical Record Number: [id number] Patient Account Number: [id number] Date of Birth/Sex: July 26, 1950 (68 y.o.  M) Treating RN: [name] Primary Care Altagracia Rone: [name] Other Clinician: Referring Hjalmar Ballengee: [name] Treating Blakely Gluth/Extender: [name], [name] Weeks in Treatment: 52 Wound Status Wound Number: 1 Primary Etiology: Pressure Ulcer Wound Location: Right Gluteal fold Wound Status: Open Wounding Event: Pressure Injury Comorbid History: Cataracts, Hypertension, Paraplegia Date Acquired: 11/02/2017 Weeks Of Treatment: 52 Clustered Wound: No Photos Wound Measurements Length: (cm) 2.5 Width: (cm) 2 Depth: (cm) 3.2 Area: (cm) 3.927 Volume: (cm) 12.566 % Reduction in Area: 60.9% % Reduction in Volume: -1150.3% Epithelialization: None Tunneling: Yes Position (o'clock): 2 Maximum Distance: (cm) 5.4 Undermining: No Wound Description Classification: Category/Stage IV Wound Margin: Epibole Exudate Amount: Large Exudate Type: Sanguinous Exudate Color: red Foul Odor After Cleansing: No Slough/Fibrino Yes Wound Bed Granulation Amount: Large (67-100%) Exposed Structure Granulation Quality: Red, Pink, Hyper-granulation Fascia Exposed: No Necrotic Amount: Small (1-33%) Fat Layer (Subcutaneous Tissue) Exposed: Yes Necrotic Quality: Adherent Slough Tendon Exposed: No Muscle Exposed: Yes Necrosis of Muscle: No Joint Exposed: No EDMUND, HOLCOMB. ([id number]) Bone Exposed: No Periwound Skin Texture Texture Color No Abnormalities Noted: No No Abnormalities Noted: No Callus: No Atrophie Blanche: No Crepitus: No Cyanosis: No Excoriation: No Ecchymosis: No Induration: No Erythema: Yes Rash: No Erythema Location: Circumferential Scarring: No Hemosiderin Staining: No Mottled: No Moisture Pallor: No No Abnormalities Noted: No Rubor: No Dry / Scaly: No Maceration: Yes Temperature / Pain Temperature: No Abnormality Tenderness on Palpation: Yes Treatment Notes Wound #1 (Right Gluteal fold) Notes dakins wet to dry, bordered foam dressing; SNF  nurses to reapply NPWT Electronic Signature(s) Signed: 11/29/2018 5:03:03 PM By: Gretta Cool, BSN, RN, CWS, Kim RN, BSN Entered By: Gretta Cool, BSN, RN, CWS, Kim on 11/29/2018 08:25:18 GLENNIE, RODDA ([id number]) -------------------------------------------------------------------------------- [place] Details Patient Name: [name] Date of Service: 11/29/2018 8:15 AM Medical Record Number: [id number] Patient Account Number: [id number] Date of Birth/Sex: 08-Mar-1951 (68 y.o. M) Treating RN: [name] Primary Care Cardell Rachel: [name] Other Clinician: Referring Riggin Cuttino: [name] Treating Lopaka Karge/Extender: [name], [name] Weeks in Treatment: 52 Vital Signs Time Taken: 08:18 Temperature (F): 98.2 Height (in): 73 Pulse (bpm): 82 Weight (lbs): 210 Respiratory Rate (breaths/min): 16 Body Mass Index (BMI): 27.7 Blood Pressure (mmHg): 159/81 Reference Range: 80 - 120 mg / dl Electronic Signature(s) Signed: 11/29/2018 5:03:03 PM By: Gretta Cool, BSN, RN, CWS, Kim RN, BSN Entered By: Gretta Cool, BSN, RN, CWS, Kim on 11/29/2018 08:19:13

## 2018-11-29 NOTE — Progress Notes (Signed)
ASHVIK, GRUNDMAN ([id number]) Visit Report for 11/29/2018 Chief Complaint Document Details Patient Name: [name], [name] Date of Service: 11/29/2018 8:15 AM Medical Record Number: [id number] Patient Account Number: [id number] Date of Birth/Sex: [birth year] (68 y.o. M) Treating RN: Harold Barban Primary Care Provider: [name] Other Clinician: Referring Provider: [name] Treating Provider/Extender: [name], HOYT Weeks in Treatment: 10 Information Obtained from: Patient Chief Complaint Right gluteal fold ulcer Electronic Signature(s) Signed: 11/29/2018 5:03:42 PM By: Worthy Keeler [name] Entered By: Worthy Keeler on 11/29/2018 08:14:01 [name], [name] ([id number]) -------------------------------------------------------------------------------- HPI Details Patient Name: [name] Date of Service: 11/29/2018 8:15 AM Medical Record Number: [id number] Patient Account Number: [id number] Date of Birth/Sex: Jul 04, 1951 (68 y.o. 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 He states that there is no history of diabetes that he is aware of. He does have issues with his prostate and is currently receiving treatment for this by way of oral medication. With that being said I do not have a lot of details in that regard. Nonetheless the patient presents today as a result of having been referred to [place] by another provider initially home health was set to come out and take care of his wound although due to the fact that  he apparently drives he's not able to receive home health. His wife is therefore trying to help take care of this wound within although they have been struggling with what exactly to do at this point. She states that she can do some things but she is definitely not a nurse and does have some issues with looking at blood. The good news is the wound does not appear to be too deep and is fairly superficial at this point. There is no slough noted there is some nonviable skin noted around the surface of the wound and the perimeter at this point. The central portion of the wound appears to be very good with a dermal layer noted this does not appear to be again deep enough to extend it to subcutaneous tissue at this point. 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 In the pump he has Dilaudid and baclofen. 12/07/17 on evaluation today patient presents for follow-up concerning his ongoing lower back thigh ulcer on the right. He states that he did not get the supplies ordered and therefore has not really been able to perform the dressing changes as directed exactly. His wife was able to get some Boarder Foam Dressing's from the drugstore and subsequently has been using hydrogel which did help to a degree in the wound does appear to be able smaller. There is actually more drainage this week noted than previous. 12/21/17 on evaluation today patient appears to be doing rather well in regard to his right gluteal ulcer. He has been tolerating the dressing changes without complication. There does not appear to be any evidence of infection at this point in time. Overall the wound does seem to be making some progress as far as the edges  are concerned there's not as much in the way of overlapping of the external wound edges and he has a good epithelium to wound bed border for the most part. This however is not true  right at the 12 o'clock location over the span of a little over a centimeters which actually will require debridement today to clean this away and hopefully allow it to continue to heal more appropriately. 12/28/17 on evaluation today patient appears to be doing rather well in regard to his ulcer in the left gluteal region. He's been tolerating the dressing changes without complication. Apparently he has had some difficulty getting his dressing material. Apparently there's been some confusion with ordering we're gonna check into this. Nonetheless overall he's been showing signs of improvement which is good news. Debridement is not required today. 01/04/18 on evaluation today patient presents for follow-up concerning his right gluteal ulcer. He has been tolerating the dressing changes fairly well. On inspection today it appears he may actually have some maceration them concerned about the fact that he may be developing too much moisture in and around the wound bed which can cause delay in healing. With that being said he unfortunately really has not showed significant signs of improvement since last week's evaluation in fact this may even be just the little bit/slightly larger. Nonetheless he's been having a lot of discomfort I'm not sure this is even related to the wound as he has no pain when I'm to breeding or otherwise cleaning the wound during evaluation today. Nonetheless this is something that we did recommend he talked to his pain specialist concerning. 01/11/18 on evaluation today patient appears to be doing better in regard to his ulceration. He has been tolerating the dressing changes without complication. With that being said overall there's no evidence of infection which is good news. The only thing is he did receive the hatch affair blue classic versus the ready nonetheless I feel like this is perfectly fine and appears to have done well for him over the past week. 01/25/18 on evaluation  today patient's wound actually appears to be a little bit larger than during the last evaluation. The good CYLAS, FALZONE. ([id number]) news is the majority of the wound edges actually appear to be fairly firmly attached to the wound bed unfortunately again we're not really making progress in regard to the size. Roughly the wound is about the same size as when I first saw him although again the wound margin/edges appear to be much better. 02/01/18 on evaluation today patient actually appears to be doing very well in regard to his wound. Applying the Prisma dry does seem to be better although he does still have issues with slow progression of the wound. There was a slight improvement compared to last week's measurements today. Nonetheless I have been considering other options as far as the possibility of Theraskin or even a snap vac. In general I'm not sure that the Theraskin due to location of the wound would be a very good idea. Nonetheless I do think that a snap vac could be a possibility for the patient and in fact I think this could even be an excellent way to manage the wound possibly seeing some improvement in a very rapid fashion here. Nonetheless this is something that we would need to get approved and I did have a lengthy conversation with the patient about this today. 02/08/18 on evaluation today patient appears to be doing a little better in regard to his ulcer.  He has been tolerating the dressing changes without complication. Fortunately despite the fact that the wound is a little bit smaller it's not significantly so unfortunately. We have discussed the possibility of a snap vac we did check with insurance this is actually covered at this point. Fortunately there does not appear to be any sign of infection. Overall I'm fairly pleased with how things seem to be appearing at this point. 02/15/18 on evaluation today patient appears to be doing rather well in regard to his right gluteal  ulcer. Unfortunately the snap vac did not stay in place with his sheer and friction this came loose and did not seem to maintain seal very well. He worked for about two days and it did seem to do very well during that time according to his wife but in general this does not seem to be something that's gonna be beneficial for him long-term. I do believe we need to go back to standard dressings to see if we can find something that will be of benefit. 03/02/18- He is here in follow up evaluation; there is minimal change in the wound. He will continue with the same treatment plan, would consider changing to iodosrob/iodoflex if ulcer continues to to plateau. He will follow up next week 03/08/18 on evaluation today patient's wound actually appears to be about the same size as when I previously saw him several weeks back. Unfortunately he does have some slightly dark discoloration in the central portion of the wound which has me concerned about pressure injury. I do believe he may be sitting for too long a period of time in fact he tells me that "I probably sit for much too long". He does have some Slough noted on the surface of the wound and again as far as the size of the wound is concerned I'm really not seeing anything that seems to have improved significantly. 03/15/18 on evaluation today patient appears to be doing fairly well in regard to his ulcer. The wound measured pretty much about the same today compared to last week's evaluation when looking at his graph. With that being said the area of bruising/deep tissue injury that was noted last week I do not see at this point. He did get a new cushion fortunately this does seem to be have been of benefit in my pinion. It does appear that he's been off of this more which is good news as well I think that is definitely showing in the overall wound measurements. With that being said I do believe that he needs to continue to offload I don't think that the fact  this is doing better should be or is going to allow him to not have to offload and explain this to him as well. Overall he seems to be in agreement the plan I think he understands. The overall appearance of the wound bed is improved compared to last week I think the Iodoflex has been beneficial in that regard. 03/29/18 on evaluation today patient actually appears to be doing rather well in regard to his wound from the overall appearance standpoint he does have some granulation although there's some Slough on the surface of the wound noted as well. With that being said he unfortunately has not improved in regard to the overall measurement of the wound in volume or in size. I did have a discussion with him very specifically about offloading today. He actually does work although he mainly is just sitting throughout the day. He tells me  he offloads by "lifting himself up for 30 seconds off of his chair occasionally" purchase from [employer] which does seem to have helped. And he has a new cushion that he with that being said he's also able to stand some for a very short period of time but not significant enough I think to provide appropriate offloading. I think the biggest issue at this point with the wound and the fact is not healing as quickly as we would like is due to the fact that he is really not able to appropriately offload while at work. He states the beginning after his injury he actually had a bed at his job that he could lay on in order to offload and that does seem to have been of help back at that time. Nonetheless he had not done this in quite some time unfortunately. I think that could be helpful for him this is something I would like for him to look into. 04/05/18 on evaluation today patient actually presents for follow-up concerning his right gluteal ulcer. Again he really is not significantly improved even compared to last week. He has been tolerating the dressing changes without  complication. With that being said fortunately there appears to be no evidence of infection at this time. He has been more proactive in trying to offload. [name], [name] ([id number]) 04/12/18 on evaluation today patient actually appears to be doing a little better in regard to his wound and the right gluteal fold region. He's been tolerating the dressing changes since removing the oasis without complication. However he was having a lot of burning initially with the oasis in place. He's unsure of exactly why this was given so much discomfort but he assumes that it was the oasis itself causing the problem. Nonetheless this had to be removed after about three days in place although even those three days seem to have made a fairly good improvement in regard to the overall appearance of the wound bed. In fact is the first time that he's made any improvement from the standpoint of measurements in about six weeks. He continues to have no discomfort over the area of the wound itself which leads me to wonder why he was having the burning with the oasis when he does not even feel the actual debridement's themselves. I am somewhat perplexed by this. 04/19/18 on evaluation today patient's wound actually appears to be showing signs of epithelialization around the edge of the wound and in general actually appears to be doing better which is good news. He did have the same burning after about three days with applying the Endoform last week in the same fashion that I would generally apply a skin substitute. This seems to indicate that it's not the oasis to cause the problem but potentially the moisture buildup that just causes things to burn or there may be some other reaction with the skin prep or Steri-Strips. Nonetheless I'm not sure that is gonna be able to tolerate any skin substitute for a long period of time. The good news is the wound actually appears to be doing better today compared to last week and  does seem to finally be making some progress. 04/26/18 on evaluation today patient actually appears to be doing rather well in regard to his ulcer in the right gluteal fold. He has been tolerating the dressing changes without complication which is good news. The Endoform does seem to be helping although he was a little bit more macerated this week. This  seems to be an ongoing issue with fluid control at this point. Nonetheless I think we may be able to add something like Drawtex to help control the drainage. 05/03/18 on evaluation today patient appears to actually be doing better in regard to the overall appearance of his wound. He has been tolerating the dressing changes without complication. Fortunately there appears to be no evidence of infection at this time. I really feel like his wound has shown signs as of today of turning around last week I thought so as well and definitely he could be seen in this week's overall appearance and measurements. In general I'm very pleased with the fact that he finally seems to be making a steady but sure progress. The patient likewise is very pleased. 05/17/18 on evaluation today patient appears to be doing more poorly unfortunately in regard to his ulcer. He has been tolerating the dressing changes without complication. With that being said he tells me that in the past couple of days he and his wife have noticed that we did not seem to be doing quite as well is getting dark near the center. Subsequently upon evaluation today the wound actually does appear to be doing worse compared to previous. He has been tolerating the dressing changes otherwise and he states that he is not been sitting up anymore than he was in the past from what he tells me. Still he has continued to work he states "I'm tired of dealing with this and if I have to just go home and lay in the bed all the time that's what I'll do". Nonetheless I am concerned about the fact that this wound does  appear to be deeper than what it was previous. 05/24/18 upon evaluation today patient actually presents after having been in the hospital due to what was presumed to be sepsis secondary to the wound infection. He had an elevated white blood cell count between 14 and 15. With that being said he does seem to be doing somewhat better now. His wound still is giving him some trouble nonetheless and he is obviously concerned about the fact likely talked about that this does seem to go more deeply than previously noted. I did review his wound culture which showed evidence of Staphylococcus aureus him and group B strep. Nonetheless he is on antibiotics, Levaquin, for this. Subsequently I did review his intake summary from the hospital as well. I also did look at the CT of the lumbar spine with contrast that was performed which showed no bone destruction to suggest lumbar disguises/osteomyelitis or sacral osteomyelitis. There was no paraspinal abscess. Nonetheless it appears this may have been more of just a soft tissue infection at this point which is good news. He still is nonetheless concerned about the wound which again I think is completely reasonable considering everything he's been through recently. 05/31/18 on evaluation today on evaluation today patient actually appears to be showing signs of his wound be a little bit deeper than what I would like to see. Fortunately he does not show any signs of significant infection although his temperature was 99 today he states he's been checking this at home and has not been elevated. Nonetheless with the undermining that I'm seeing at this point I am becoming more concerned about the wound I do think that offloading is a key factor here that is preventing the speedy recovery at this point. There does not appear to be any evidence of again over infection noted. He's been using  Santyl currently. 06/07/18 the patient presents today for follow-up evaluation  regarding the left ulcer in the gluteal region. He has been tolerating the Wound VAC fairly well. He is obviously very frustrated with this he states that to mean is really getting in his way. There does not appear to be any evidence of infection at this time he does have a little bit of odor I do not necessarily associate this with infection just something that we sometimes notice with Wound VAC therapy. With that being said I can definitely catch a tone of discontentment overall in the patient's demeanor today. This when he was previously in the hospital NED, KAKAR. ([id number]) an CT scan was done of the lumbar region which did not reveal any signs of osteomyelitis. With that being said the pelvis in particular was not evaluated distinctly which means he could still have some osteonecrosis I. Nonetheless the Wound VAC was started on Thursday I do want to get this little bit more time before jumping to a CT scan of the pelvis although that is something that I might would recommend if were not see an improvement by that time. 06/14/18 on evaluation today patient actually appears to be doing about the same in regard to his right gluteal ulcer. Again he did have a CT scan of the lumbar spine unfortunately this did not include the pelvis. Nonetheless with the depth of the wound that I'm seeing today even despite the fact that I'm not seeing any evidence of overt cellulitis I believe there's a good chance that we may be dealing with osteomyelitis somewhere in the right Ischial region. No fevers, chills, nausea, or vomiting noted at this time. 06/21/18 on evaluation today patient actually appears to be doing about the same with regard to his wound. The tunnel at 6 o'clock really does not appear to be any deeper although it is a little bit wider. I think at this point you may want to start packing this with white phone. Unfortunately I have not got approval for the CT scan of the pelvis as of yet  due to the fact that Medicare apparently has been denied it due to the diagnosis codes not being appropriate according to Medicare for the test requested. With that being said the patient cannot have an MRI and therefore this is the only option that we have as far as testing is concerned. The patient has had infection and was on antibiotics and been added code for cellulitis of the bottom to see if this will be appropriate for getting the test approved. Nonetheless I'm concerned about the infection have been spread deeper into the Ischial region. 06/28/18 on evaluation today patient actually appears to be doing rather well all things considered in regard to the right gluteal ulcer. He has been tolerating the dressing changes without complication. With that being said the Wound VAC he states does have to be replaced almost every day or at least reinforced unfortunately. Patient actually has his CT scan later this morning we should have the results by tomorrow. 07/05/18 on evaluation today patient presents for follow-up concerning his right Ischial ulcer. He did see the surgeon Dr. Lysle Pearl last week. They were actually very happy with him and felt like he spent a tremendous amount of time with them as far as discussing his situation was concerned. In the end Dr. Lysle Pearl did contact me as well and determine that he would not recommend any surgical intervention at this point as he felt like  it would not be in the patient's best interest based on what he was seeing. He recommended a referral to infectious disease. Subsequently this is something that Dr. Ines Bloomer office is working on setting up for the patient. As far as evaluation today is concerned the patient's wound actually appears to be worse at this point. I am concerned about how things are progressing and specifically about infection. I do not feel like it's the deeper but the area of depth is definitely widening which does have me concerned. No  fevers, chills, nausea, or vomiting noted at this time. I think that we do need initiate antibiotic therapy the patient has an allow allergy to amoxicillin/penicillin he states that he gets a rash since childhood. Nonetheless she's never had the issues with Catholics or cephalosporins in general but he is aware of. 07/27/18 on evaluation today patient presents following admission to the hospital on 07/09/18. He was subsequently discharged on 07/20/18. On 07/15/18 the patient underwent irrigation and debridement was soft tissue biopsy and bone biopsy as well as placement of a Wound VAC in the OR by Dr. Celine Ahr. During the hospital course the patient was placed on a Wound VAC and recommended follow up with surgery in three weeks actually with Dr. Delaine Lame who is infectious disease. The patient was on vancomycin during the hospital course. 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 He also had a bone culture which revealed evidence of methicillin-resistant staph aureus. He is updated CT scan 07/09/18 reveals that he had progression of the which was performed on wound to breakdown down to the trochanter where he actually had irregularities there as well suggestive of osteomyelitis. This was a change just since 9 December when we last performed a CT scan. Obviously this one had gone downhill quite significantly and rapidly. At this point upon evaluation I feel like in general the patient's wound seems to be doing fairly well all things considered upon my evaluation today. Obviously this is larger and deeper than what I previously evaluated but at the same time he seems to be making some progress as far as the appearance of the granulation tissue is concerned. I'm happy in that regard. No fevers, chills, nausea, or vomiting noted at this time. He is on IV vancomycin and Rocephin at the facility. He is currently in [employer]. 08/03/18 upon evaluation today  patient's wound appears to be doing better in regard to the overall appearance at this point in time. Fortunately he's been tolerating the Wound VAC without complication and states that the facility has been taking excellent care of the wound site. Overall I see some Slough noted on the surface which I am going to attempt sharp debridement today of but nonetheless other than this I feel like he's making progress. 08/09/18 on evaluation today patient's wound appears to be doing much better compared to even last week's evaluation. Do believe that the Wound VAC is been of great benefit for him. He has been tolerating the dressing changes that is the Wound JORDANY, RUSSETT. ([id number]) VAC without any complication and he has excellent granulation noted currently. There is no need for sharp debridement at this point. 08/16/18 on evaluation today patient actually appears to be doing very well in regard to the wound in the right gluteal fold region. This is showing signs of progress and again appears to be very healthy which is excellent news. Fortunately there is no sign of active infection by way of odor  or drainage at this point. Overall I'm very pleased with how things stand. He seems to be tolerating the Wound VAC without complication. 08/23/18 on evaluation today patient actually appears to be doing better in regard to his wound. He has been tolerating the Wound VAC without complication and in fact it has been collecting a significant amount of drainage which I think is good news especially considering how the wound appears. Fortunately there is no signs of infection at this time definitely nothing appears to be worse which is good news. He has not been started on the Bactrim and Flagyl that was recommended by Dr. Delaine Lame yet. I did actually contact her office this morning in order to check and see were things are that regard their gonna be calling me back. 08/30/18 on evaluation today patient  actually appears to show signs of excellent improvement today compared to last evaluation. The undermining is getting much better the wound seems to be feeling quite nicely and I'm very pleased that the granulation in general. With that being said overall I feel like the patient has made excellent progress which is great news. No fevers, chills, nausea, or vomiting noted at this time. 09/06/18 on evaluation today patient actually appears to be doing rather well in regard to his right gluteal ulcer. This is showing signs of improvement in overall I'm very pleased with how things seem to be progressing. The patient likewise is please. Overall I see no evidence of infection he is about to complete his oral antibiotic regimen which is the end of the antibiotics for him in just about three days. 09/13/18 on evaluation today patient's right Ischial ulcer appears to be showing signs of continued improvement which is excellent news. He's been tolerating the dressing changes without complication. Fortunately there's no signs of infection and the wound that seems to be doing very well. 09/28/18 on evaluation today patient appears to be doing rather well in regard to his right Ischial ulcer. He's been tolerating the Wound VAC without complication he knows there's much less drainage than there used to be this obviously is not a bad thing in my pinion. There's no evidence of infection despite the fact is but nothing about it now for several weeks. 10/04/18 on evaluation today patient appears to be doing better in regard to his right Ischial wound. He has been tolerating the Wound VAC without complication and I do believe that the silver nitrate last week was beneficial for him. Fortunately overall there's no evidence of active infection at this time which is great news. No fevers, chills, nausea, or vomiting noted at this time. 10/11/18 on evaluation today patient actually appears to be doing rather well in regard  to his Ischial ulcer. He's been tolerating the Wound VAC still without complication I feel like this is doing a good job. No fevers, chills, nausea, or vomiting noted at this time. 11/01/18 on evaluation today patient presents after having not been seen in our clinic for several weeks secondary to the fact that he was on evaluation today patient presents after having not been seen in our clinic for several weeks secondary to the fact that he was in a skilled nursing facility which was on lockdown currently due to the covert 19 national emergency. Subsequently he was discharged from the facility on this past Friday and subsequently made an appointment to come in to see yesterday. Fortunately there's no signs of active infection at this time which is good news and overall he does  seem to have made progress since I last saw. Overall I feel like things are progressing quite nicely. The patient is having no pain. 11/08/18 on evaluation today patient appears to be doing okay in regard to his right gluteal ulcer. He has been utilizing a Wound VAC home health this changing this at this point since he's home from the skilled nursing facility. Fortunately there's no signs of obvious active infection at this time. Unfortunately though there's no obvious active infection he is having some maceration and his wife states that when the sheets of the Wound VAC office on Sunday when it broke seal that he ended up having significant issues with some smell as well there concerned about the possibility of infection. Fortunately there's No fevers, chills, nausea, or vomiting noted at this time. 11/15/18 on evaluation today patient actually appears to be doing well in regard to his right gluteal ulcer. He has been tolerating the dressing changes without complication. Specifically the Wound VAC has been utilized up to this point. Fortunately there's no signs of infection and overall I feel like he has made progress even  since last week when I last saw him. I'm actually fairly happy with the overall appearance although he does seem to have somewhat of a hyper granular Hofbauer, [name] J. ([id number]) overgrowth in the central portion of the wound which I think may require some sharp debridement to try flatness out possibly utilizing chemical cauterization following. 11/23/18 on evaluation today patient actually appears to be doing very well in regard to his sacral ulcer. He seems to be showing signs of improvement with good granulation. With that being said he still has the small area of hyper granulation right in the central portion of the wound which I'm gonna likely utilize silver nitrate on today. Subsequently he also keeps having a leak at the 6 o'clock location which is unfortunate we may be able to help out with some suggestions to try to prevent this going forward. Fortunately there's no signs of active infection at this time. 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 He's been tolerating the dressing changes without complication. Fortunately there's no signs of active infection at this time. I've been rather pleased with how things have progressed there still some evidence of pressure getting to the area with some redness right around the immediate wound opening. Nonetheless other than this I'm not seeing any significant complications or issues the wound is somewhat hyper granular. Upon discussing with the patient and his wife today I'm not sure that the wound is being packed to the base with the foam at this point. And if it's not been packed fully that may be part of the reason why is not seen as much improvement as far as the granulation from the base out. Again we do not want pack too tightly but we need some of the firm to get to the base of the wound. I discussed this with patient and his wife  today. Electronic Signature(s) Signed: 11/29/2018 5:03:42 PM By: Worthy Keeler [name] Entered By: Worthy Keeler on 11/29/2018 10:01:32 [name], [name] ([id number]) -------------------------------------------------------------------------------- [facility] Details Patient Name: [name] Date of Service: 11/29/2018 8:15 AM Medical Record Number: [id number] Patient Account Number: [id number] Date of Birth/Sex: Jan 24, 1951 (68 y.o. M) Treating RN: Harold Barban Primary Care Provider: [name] Other Clinician: Referring Provider: [name] Treating Provider/Extender: [name]  III, HOYT Weeks in Treatment: 52 Procedure Performed for: Wound #1 Right Gluteal fold Performed By: Physician [name], Shirlee Latch., [name] Post Procedure Diagnosis Same as Pre-procedure Electronic Signature(s) Signed: 11/29/2018 4:53:18 PM By: Harold Barban Entered By: Harold Barban on 11/29/2018 08:41:42 [name], [name] ([id number]) -------------------------------------------------------------------------------- Physical Exam Details Patient Name: [name] Date of Service: 11/29/2018 8:15 AM Medical Record Number: [id number] Patient Account Number: [id number] Date of Birth/Sex: 1950/08/14 (68 y.o. M) Treating RN: Harold Barban Primary Care Provider: [name] Other Clinician: Referring Provider: [name] Treating Provider/Extender: [name], HOYT Weeks in Treatment: 68 Constitutional Well-nourished and well-hydrated in no acute distress. Respiratory normal breathing without difficulty. clear to auscultation bilaterally. Cardiovascular regular rate and rhythm with normal S1, S2. Psychiatric this patient is able to make decisions and demonstrates good insight into disease process. Alert and Oriented x 3. pleasant and cooperative. Notes Patient's wound bed again did show hyper granular tissue I did therefore go ahead and initiate  treatment with silver nitrate to try to help out with the hyper granulation. He tolerated treatment today without complication post treatment seems to be doing quite well. Overall very pleased with the progress and how things have progressed up to this point. My hope is that we may be able to get things even better if we can indeed improve getting some of the foam to the base of his wound. Electronic Signature(s) Signed: 11/29/2018 5:03:42 PM By: Worthy Keeler [name] Entered By: Worthy Keeler on 11/29/2018 10:02:38 [name], [name] ([id number]) -------------------------------------------------------------------------------- Physician Orders Details Patient Name: [name] Date of Service: 11/29/2018 8:15 AM Medical Record Number: [id number] Patient Account Number: [id number] Date of Birth/Sex: 18-Apr-1951 (68 y.o. 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 Primary Wound Dressing Wound #1 Right Gluteal fold o Dry Gauze - wet to dry in clinic until [facility] restarts NPWT Secondary Dressing Wound #1 Right Gluteal fold o Boardered Foam Dressing - in clinic until [facility] restarts NPWT Dressing Change Frequency Wound #1 Right Gluteal fold o Change Dressing Monday, Wednesday, Friday - and as  needed Follow-up Appointments Wound #1 Right Gluteal fold o Return Appointment in 1 week. Off-Loading Wound #1 Right Gluteal fold o Mattress - Please continue air mattress at SNF o Turn and reposition every 2 hours Spurgeon #1 Right Gluteal fold o [place] Visits Ragone, SWIDERSKI ([id number]) o [place] Nurse may visit PRN to address patientos wound care needs. o FACE TO FACE ENCOUNTER: MEDICARE and MEDICAID PATIENTS: I certify that this patient is under my care and that I had a face-to-face encounter that meets the physician face-to-face encounter requirements with this patient on this date. The encounter with the patient was in whole or in part for the following MEDICAL CONDITION: (primary reason for [place]) MEDICAL NECESSITY: I certify, that based on my findings, NURSING services are a medically necessary home health service. HOME BOUND STATUS:  I certify that my clinical findings support that this patient is homebound (i.e., Due to illness or injury, pt requires aid of supportive devices such as crutches, cane, wheelchairs, walkers, the use of special transportation or the assistance of another person to leave their place of residence. There is a normal inability to leave the home and doing so requires considerable and taxing effort. Other absences are for medical reasons / religious services and are infrequent or of short duration when for other reasons). o If current dressing causes regression in wound condition, may D/C ordered dressing product/s and apply Normal Saline Moist Dressing daily until next [place] / Other MD appointment. [place] of regression in wound condition at [phone]. o Please direct any NON-WOUND related issues/requests for orders to patient's Primary Care Physician Negative Pressure Wound Therapy Wound #1 Right Gluteal fold o Wound VAC settings at 125/130 mmHg continuous  pressure. Use BLACK/GREEN foam to wound cavity. Use WHITE foam to fill any tunnel/s and/or undermining. Change VAC dressing 3 X WEEK. Change canister as indicated when full. Nurse may titrate settings and frequency of dressing changes as clinically indicated. - PLEASE, apply Silver Collagen to line the base of the wound. Black foam only! Lightly pack foam into 12 0'clock tunnel (4cm.), then place second piece of foam over wound opening. Please use and order for patient Duoderm to peri wound to help drape seal better. Thank you. o Home Health Nurse may d/c VAC for s/s of increased infection, significant wound regression, or uncontrolled drainage. [place] at [phone]. o Number of foam/gauze pieces used in the dressing = Electronic Signature(s) Signed: 11/29/2018 4:53:18 PM By: Harold Barban Signed: 11/29/2018 5:03:42 PM By: Worthy Keeler [name] Entered By: Harold Barban on 11/29/2018 08:44:30 AKHILESH, SASSONE ([id number]) -------------------------------------------------------------------------------- Problem List Details Patient Name: [name] Date of Service: 11/29/2018 8:15 AM Medical Record Number: [id number] Patient Account Number: [id number] Date of Birth/Sex: 22-Feb-1951 (68 y.o. M) Treating RN: Harold Barban Primary Care Provider: [name] Other Clinician: Referring Provider: [name] Treating Provider/Extender: [name], HOYT Weeks in Treatment: 75 Active Problems ICD-10 Evaluated Encounter Code Description Active Date Today Diagnosis L89.314 Pressure ulcer of right buttock, stage 4 11/30/2017 No Yes L03.317 Cellulitis of buttock 06/21/2018 No Yes G82.20 Paraplegia, unspecified 11/30/2017 No Yes S34.109S Unspecified injury to unspecified level of lumbar spinal cord, 11/30/2017 No Yes sequela I10 Essential (primary) hypertension 11/30/2017 No Yes Inactive Problems Resolved Problems Electronic Signature(s) Signed:  11/29/2018 5:03:42 PM By: Worthy Keeler [name] Entered By: Worthy Keeler on 11/29/2018 08:13:50 [name], [name] ([id number]) -------------------------------------------------------------------------------- Progress Note Details Patient Name: [name] Date of Service: 11/29/2018 8:15 AM Medical Record Number: [id number] Patient Account Number: [id number] Date of Birth/Sex: May 25, 1951 (68 y.o. 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 He states that there is no history of diabetes that he is aware of. He does have issues with his prostate and is currently receiving treatment for this by way of oral medication. With that being said I  do not have a lot of details in that regard. Nonetheless the patient presents today as a result of having been referred to [place] by another provider initially home health was set to come out and take care of his wound although due to the fact that he apparently drives he's not able to receive home health. His wife is therefore trying to help take care of this wound within although they have been struggling with what exactly to do at this point. She states that she can do some things but she is definitely not a nurse and does have some issues with looking at blood. The good news is the wound does not appear to be too deep and is fairly superficial at this point. There is no slough noted there is some nonviable skin noted around the  surface of the wound and the perimeter at this point. The central portion of the wound appears to be very good with a dermal layer noted this does not appear to be again deep enough to extend it to subcutaneous tissue at this point. 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 In the pump he has Dilaudid and baclofen. 12/07/17 on evaluation today patient presents for follow-up concerning his ongoing lower back thigh ulcer on the right. He states that he did not get the supplies ordered and therefore has not really been able to perform the dressing changes as directed exactly. His wife was able to get some Boarder Foam Dressing's from the drugstore and subsequently has been using hydrogel which did help to a degree in the wound does appear to be able smaller. There is actually more drainage this week noted than previous. 12/21/17 on evaluation today patient appears to be doing rather well in regard to his right gluteal ulcer. He has been tolerating the dressing changes without complication. There does not appear to be any evidence of infection at this point in time. Overall the wound does seem to be making some progress as far as the edges are concerned there's not as much in the way of overlapping of the external wound edges and he has a good epithelium to wound bed border for the most part. This however is not true right at the 12 o'clock location over the span of a little over a centimeters which actually will require debridement today to clean this away and hopefully allow it to continue to heal more appropriately. 12/28/17 on evaluation today patient appears to be doing rather well in regard to his ulcer in the left gluteal region. He's been tolerating the dressing changes without complication. Apparently he has had some difficulty getting his dressing material. Apparently there's been some  confusion with ordering we're gonna check into this. Nonetheless overall he's been showing signs of improvement which is good news. Debridement is not required today. 01/04/18 on evaluation today patient presents for follow-up concerning his right gluteal ulcer. He has been tolerating the dressing changes fairly well. On inspection today it appears he may actually have some maceration them concerned about the fact that he may be developing too much moisture in and around the wound bed which can cause delay in healing. With that being said he unfortunately really has not showed significant signs of improvement since last week's evaluation in fact this may even be just the little bit/slightly larger. Nonetheless he's been having a lot of discomfort I'm not sure  this is even related to the wound as he has no pain when I'm to breeding or otherwise cleaning the wound during evaluation today. Nonetheless this is something that we did recommend he talked to his pain specialist concerning. 01/11/18 on evaluation today patient appears to be doing better in regard to his ulceration. He has been tolerating the dressing [name], [name] ([id number]) changes without complication. With that being said overall there's no evidence of infection which is good news. The only thing is he did receive the hatch affair blue classic versus the ready nonetheless I feel like this is perfectly fine and appears to have done well for him over the past week. 01/25/18 on evaluation today patient's wound actually appears to be a little bit larger than during the last evaluation. The good news is the majority of the wound edges actually appear to be fairly firmly attached to the wound bed unfortunately again we're not really making progress in regard to the size. Roughly the wound is about the same size as when I first saw him although again the wound margin/edges appear to be much better. 02/01/18 on evaluation today patient  actually appears to be doing very well in regard to his wound. Applying the Prisma dry does seem to be better although he does still have issues with slow progression of the wound. There was a slight improvement compared to last week's measurements today. Nonetheless I have been considering other options as far as the possibility of Theraskin or even a snap vac. In general I'm not sure that the Theraskin due to location of the wound would be a very good idea. Nonetheless I do think that a snap vac could be a possibility for the patient and in fact I think this could even be an excellent way to manage the wound possibly seeing some improvement in a very rapid fashion here. Nonetheless this is something that we would need to get approved and I did have a lengthy conversation with the patient about this today. 02/08/18 on evaluation today patient appears to be doing a little better in regard to his ulcer. He has been tolerating the dressing changes without complication. Fortunately despite the fact that the wound is a little bit smaller it's not significantly so unfortunately. We have discussed the possibility of a snap vac we did check with insurance this is actually covered at this point. Fortunately there does not appear to be any sign of infection. Overall I'm fairly pleased with how things seem to be appearing at this point. 02/15/18 on evaluation today patient appears to be doing rather well in regard to his right gluteal ulcer. Unfortunately the snap vac did not stay in place with his sheer and friction this came loose and did not seem to maintain seal very well. He worked for about two days and it did seem to do very well during that time according to his wife but in general this does not seem to be something that's gonna be beneficial for him long-term. I do believe we need to go back to standard dressings to see if we can find something that will be of benefit. 03/02/18- He is here in follow up  evaluation; there is minimal change in the wound. He will continue with the same treatment plan, would consider changing to iodosrob/iodoflex if ulcer continues to to plateau. He will follow up next week 03/08/18 on evaluation today patient's wound actually appears to be about the same size as when I previously saw  him several weeks back. Unfortunately he does have some slightly dark discoloration in the central portion of the wound which has me concerned about pressure injury. I do believe he may be sitting for too long a period of time in fact he tells me that "I probably sit for much too long". He does have some Slough noted on the surface of the wound and again as far as the size of the wound is concerned I'm really not seeing anything that seems to have improved significantly. 03/15/18 on evaluation today patient appears to be doing fairly well in regard to his ulcer. The wound measured pretty much about the same today compared to last week's evaluation when looking at his graph. With that being said the area of bruising/deep tissue injury that was noted last week I do not see at this point. He did get a new cushion fortunately this does seem to be have been of benefit in my pinion. It does appear that he's been off of this more which is good news as well I think that is definitely showing in the overall wound measurements. With that being said I do believe that he needs to continue to offload I don't think that the fact this is doing better should be or is going to allow him to not have to offload and explain this to him as well. Overall he seems to be in agreement the plan I think he understands. The overall appearance of the wound bed is improved compared to last week I think the Iodoflex has been beneficial in that regard. 03/29/18 on evaluation today patient actually appears to be doing rather well in regard to his wound from the overall appearance standpoint he does have some granulation  although there's some Slough on the surface of the wound noted as well. With that being said he unfortunately has not improved in regard to the overall measurement of the wound in volume or in size. I did have a discussion with him very specifically about offloading today. He actually does work although he mainly is just sitting throughout the day. He tells me he offloads by "lifting himself up for 30 seconds off of his chair occasionally" purchase from [employer] which does seem to have helped. And he has a new cushion that he with that being said he's also able to stand some for a very short period of time but not significant enough I think to provide appropriate offloading. I think the biggest issue at this point with the wound and the fact is not healing as quickly as we would like is due to the fact that he is really not able to appropriately offload while at work. He states the beginning after his injury he actually had a bed at his job that he could lay on in order to offload and that does seem to have been of help back at that time. Nonetheless he had not done this in quite some time unfortunately. I think that could be helpful for him this is something I would like for him to look into. [name], [name] ([id number]) 04/05/18 on evaluation today patient actually presents for follow-up concerning his right gluteal ulcer. Again he really is not significantly improved even compared to last week. He has been tolerating the dressing changes without complication. With that being said fortunately there appears to be no evidence of infection at this time. He has been more proactive in trying to offload. 04/12/18 on evaluation today patient actually appears  to be doing a little better in regard to his wound and the right gluteal fold region. He's been tolerating the dressing changes since removing the oasis without complication. However he was having a lot of burning initially with the oasis  in place. He's unsure of exactly why this was given so much discomfort but he assumes that it was the oasis itself causing the problem. Nonetheless this had to be removed after about three days in place although even those three days seem to have made a fairly good improvement in regard to the overall appearance of the wound bed. In fact is the first time that he's made any improvement from the standpoint of measurements in about six weeks. He continues to have no discomfort over the area of the wound itself which leads me to wonder why he was having the burning with the oasis when he does not even feel the actual debridement's themselves. I am somewhat perplexed by this. 04/19/18 on evaluation today patient's wound actually appears to be showing signs of epithelialization around the edge of the wound and in general actually appears to be doing better which is good news. He did have the same burning after about three days with applying the Endoform last week in the same fashion that I would generally apply a skin substitute. This seems to indicate that it's not the oasis to cause the problem but potentially the moisture buildup that just causes things to burn or there may be some other reaction with the skin prep or Steri-Strips. Nonetheless I'm not sure that is gonna be able to tolerate any skin substitute for a long period of time. The good news is the wound actually appears to be doing better today compared to last week and does seem to finally be making some progress. 04/26/18 on evaluation today patient actually appears to be doing rather well in regard to his ulcer in the right gluteal fold. He has been tolerating the dressing changes without complication which is good news. The Endoform does seem to be helping although he was a little bit more macerated this week. This seems to be an ongoing issue with fluid control at this point. Nonetheless I think we may be able to add something like Drawtex  to help control the drainage. 05/03/18 on evaluation today patient appears to actually be doing better in regard to the overall appearance of his wound. He has been tolerating the dressing changes without complication. Fortunately there appears to be no evidence of infection at this time. I really feel like his wound has shown signs as of today of turning around last week I thought so as well and definitely he could be seen in this week's overall appearance and measurements. In general I'm very pleased with the fact that he finally seems to be making a steady but sure progress. The patient likewise is very pleased. 05/17/18 on evaluation today patient appears to be doing more poorly unfortunately in regard to his ulcer. He has been tolerating the dressing changes without complication. With that being said he tells me that in the past couple of days he and his wife have noticed that we did not seem to be doing quite as well is getting dark near the center. Subsequently upon evaluation today the wound actually does appear to be doing worse compared to previous. He has been tolerating the dressing changes otherwise and he states that he is not been sitting up anymore than he was in the past from what  he tells me. Still he has continued to work he states "I'm tired of dealing with this and if I have to just go home and lay in the bed all the time that's what I'll do". Nonetheless I am concerned about the fact that this wound does appear to be deeper than what it was previous. 05/24/18 upon evaluation today patient actually presents after having been in the hospital due to what was presumed to be sepsis secondary to the wound infection. He had an elevated white blood cell count between 14 and 15. With that being said he does seem to be doing somewhat better now. His wound still is giving him some trouble nonetheless and he is obviously concerned about the fact likely talked about that this does seem to go  more deeply than previously noted. I did review his wound culture which showed evidence of Staphylococcus aureus him and group B strep. Nonetheless he is on antibiotics, Levaquin, for this. Subsequently I did review his intake summary from the hospital as well. I also did look at the CT of the lumbar spine with contrast that was performed which showed no bone destruction to suggest lumbar disguises/osteomyelitis or sacral osteomyelitis. There was no paraspinal abscess. Nonetheless it appears this may have been more of just a soft tissue infection at this point which is good news. He still is nonetheless concerned about the wound which again I think is completely reasonable considering everything he's been through recently. 05/31/18 on evaluation today on evaluation today patient actually appears to be showing signs of his wound be a little bit deeper than what I would like to see. Fortunately he does not show any signs of significant infection although his temperature was 99 today he states he's been checking this at home and has not been elevated. Nonetheless with the undermining that I'm seeing at this point I am becoming more concerned about the wound I do think that offloading is a key factor here that is preventing the speedy recovery at this point. There does not appear to be any evidence of again over infection noted. He's been using Santyl currently. [name], [name] ([id number]) 06/07/18 the patient presents today for follow-up evaluation regarding the left ulcer in the gluteal region. He has been tolerating the Wound VAC fairly well. He is obviously very frustrated with this he states that to mean is really getting in his way. There does not appear to be any evidence of infection at this time he does have a little bit of odor I do not necessarily associate this with infection just something that we sometimes notice with Wound VAC therapy. With that being said I can definitely catch a  tone of discontentment overall in the patient's demeanor today. This when he was previously in the hospital an CT scan was done of the lumbar region which did not reveal any signs of osteomyelitis. With that being said the pelvis in particular was not evaluated distinctly which means he could still have some osteonecrosis I. Nonetheless the Wound VAC was started on Thursday I do want to get this little bit more time before jumping to a CT scan of the pelvis although that is something that I might would recommend if were not see an improvement by that time. 06/14/18 on evaluation today patient actually appears to be doing about the same in regard to his right gluteal ulcer. Again he did have a CT scan of the lumbar spine unfortunately this did not include the pelvis. Nonetheless  with the depth of the wound that I'm seeing today even despite the fact that I'm not seeing any evidence of overt cellulitis I believe there's a good chance that we may be dealing with osteomyelitis somewhere in the right Ischial region. No fevers, chills, nausea, or vomiting noted at this time. 06/21/18 on evaluation today patient actually appears to be doing about the same with regard to his wound. The tunnel at 6 o'clock really does not appear to be any deeper although it is a little bit wider. I think at this point you may want to start packing this with white phone. Unfortunately I have not got approval for the CT scan of the pelvis as of yet due to the fact that Medicare apparently has been denied it due to the diagnosis codes not being appropriate according to Medicare for the test requested. With that being said the patient cannot have an MRI and therefore this is the only option that we have as far as testing is concerned. The patient has had infection and was on antibiotics and been added code for cellulitis of the bottom to see if this will be appropriate for getting the test approved. Nonetheless I'm concerned about  the infection have been spread deeper into the Ischial region. 06/28/18 on evaluation today patient actually appears to be doing rather well all things considered in regard to the right gluteal ulcer. He has been tolerating the dressing changes without complication. With that being said the Wound VAC he states does have to be replaced almost every day or at least reinforced unfortunately. Patient actually has his CT scan later this morning we should have the results by tomorrow. 07/05/18 on evaluation today patient presents for follow-up concerning his right Ischial ulcer. He did see the surgeon Dr. Lysle Pearl last week. They were actually very happy with him and felt like he spent a tremendous amount of time with them as far as discussing his situation was concerned. In the end Dr. Lysle Pearl did contact me as well and determine that he would not recommend any surgical intervention at this point as he felt like it would not be in the patient's best interest based on what he was seeing. He recommended a referral to infectious disease. Subsequently this is something that Dr. Ines Bloomer office is working on setting up for the patient. As far as evaluation today is concerned the patient's wound actually appears to be worse at this point. I am concerned about how things are progressing and specifically about infection. I do not feel like it's the deeper but the area of depth is definitely widening which does have me concerned. No fevers, chills, nausea, or vomiting noted at this time. I think that we do need initiate antibiotic therapy the patient has an allow allergy to amoxicillin/penicillin he states that he gets a rash since childhood. Nonetheless she's never had the issues with Catholics or cephalosporins in general but he is aware of. 07/27/18 on evaluation today patient presents following admission to the hospital on 07/09/18. He was subsequently discharged on 07/20/18. On 07/15/18 the patient underwent  irrigation and debridement was soft tissue biopsy and bone biopsy as well as placement of a Wound VAC in the OR by Dr. Celine Ahr. During the hospital course the patient was placed on a Wound VAC and recommended follow up with surgery in three weeks actually with Dr. Delaine Lame who is infectious disease. The patient was on vancomycin during the hospital course. He did have a bone culture which showed  evidence of chronic osteomyelitis. He also had a bone culture which revealed evidence of methicillin-resistant staph aureus. He is updated CT scan 07/09/18 reveals that he had progression of the which was performed on wound to breakdown down to the trochanter where he actually had irregularities there as well suggestive of osteomyelitis. This was a change just since 9 December when we last performed a CT scan. Obviously this one had gone downhill quite significantly and rapidly. At this point upon evaluation I feel like in general the patient's wound seems to be doing fairly well all things considered upon my evaluation today. Obviously this is larger and deeper than what I previously evaluated but at the same time he seems to be making some progress as far as the appearance of the granulation tissue is concerned. I'm happy in that regard. No fevers, chills, nausea, or vomiting noted at this time. He is on IV vancomycin and Rocephin at the facility. He is currently in [facility]. 08/03/18 upon evaluation today patient's wound appears to be doing better in regard to the overall appearance at this point in time. Fortunately he's been tolerating the Wound VAC without complication and states that the facility has been taking [name], [name]. ([id number]) excellent care of the wound site. Overall I see some Slough noted on the surface which I am going to attempt sharp debridement today of but nonetheless other than this I feel like he's making progress. 08/09/18 on evaluation today patient's wound  appears to be doing much better compared to even last week's evaluation. Do believe that the Wound VAC is been of great benefit for him. He has been tolerating the dressing changes that is the Wound VAC without any complication and he has excellent granulation noted currently. There is no need for sharp debridement at this point. 08/16/18 on evaluation today patient actually appears to be doing very well in regard to the wound in the right gluteal fold region. This is showing signs of progress and again appears to be very healthy which is excellent news. Fortunately there is no sign of active infection by way of odor or drainage at this point. Overall I'm very pleased with how things stand. He seems to be tolerating the Wound VAC without complication. 08/23/18 on evaluation today patient actually appears to be doing better in regard to his wound. He has been tolerating the Wound VAC without complication and in fact it has been collecting a significant amount of drainage which I think is good news especially considering how the wound appears. Fortunately there is no signs of infection at this time definitely nothing appears to be worse which is good news. He has not been started on the Bactrim and Flagyl that was recommended by Dr. Delaine Lame yet. I did actually contact her office this morning in order to check and see were things are that regard their gonna be calling me back. 08/30/18 on evaluation today patient actually appears to show signs of excellent improvement today compared to last evaluation. The undermining is getting much better the wound seems to be feeling quite nicely and I'm very pleased that the granulation in general. With that being said overall I feel like the patient has made excellent progress which is great news. No fevers, chills, nausea, or vomiting noted at this time. 09/06/18 on evaluation today patient actually appears to be doing rather well in regard to his right gluteal  ulcer. This is showing signs of improvement in overall I'm very pleased with how things  seem to be progressing. The patient likewise is please. Overall I see no evidence of infection he is about to complete his oral antibiotic regimen which is the end of the antibiotics for him in just about three days. 09/13/18 on evaluation today patient's right Ischial ulcer appears to be showing signs of continued improvement which is excellent news. He's been tolerating the dressing changes without complication. Fortunately there's no signs of infection and the wound that seems to be doing very well. 09/28/18 on evaluation today patient appears to be doing rather well in regard to his right Ischial ulcer. He's been tolerating the Wound VAC without complication he knows there's much less drainage than there used to be this obviously is not a bad thing in my pinion. There's no evidence of infection despite the fact is but nothing about it now for several weeks. 10/04/18 on evaluation today patient appears to be doing better in regard to his right Ischial wound. He has been tolerating the Wound VAC without complication and I do believe that the silver nitrate last week was beneficial for him. Fortunately overall there's no evidence of active infection at this time which is great news. No fevers, chills, nausea, or vomiting noted at this time. 10/11/18 on evaluation today patient actually appears to be doing rather well in regard to his Ischial ulcer. He's been tolerating the Wound VAC still without complication I feel like this is doing a good job. No fevers, chills, nausea, or vomiting noted at this time. 11/01/18 on evaluation today patient presents after having not been seen in our clinic for several weeks secondary to the fact that he was on evaluation today patient presents after having not been seen in our clinic for several weeks secondary to the fact that he was in a skilled nursing facility which was on  lockdown currently due to the covert 19 national emergency. Subsequently he was discharged from the facility on this past Friday and subsequently made an appointment to come in to see yesterday. Fortunately there's no signs of active infection at this time which is good news and overall he does seem to have made progress since I last saw. Overall I feel like things are progressing quite nicely. The patient is having no pain. 11/08/18 on evaluation today patient appears to be doing okay in regard to his right gluteal ulcer. He has been utilizing a Wound VAC home health this changing this at this point since he's home from the skilled nursing facility. Fortunately there's no signs of obvious active infection at this time. Unfortunately though there's no obvious active infection he is having some maceration and his wife states that when the sheets of the Wound VAC office on Sunday when it broke seal that he ended up having significant issues with some smell as well there concerned about the possibility of infection. Fortunately there's No fevers, chills, nausea, or vomiting noted at this time. SEYDOU, HEARNS ([id number]) 11/15/18 on evaluation today patient actually appears to be doing well in regard to his right gluteal ulcer. He has been tolerating the dressing changes without complication. Specifically the Wound VAC has been utilized up to this point. Fortunately there's no signs of infection and overall I feel like he has made progress even since last week when I last saw him. I'm actually fairly happy with the overall appearance although he does seem to have somewhat of a hyper granular overgrowth in the central portion of the wound which I think may require some sharp  debridement to try flatness out possibly utilizing chemical cauterization following. 11/23/18 on evaluation today patient actually appears to be doing very well in regard to his sacral ulcer. He seems to be showing signs of  improvement with good granulation. With that being said he still has the small area of hyper granulation right in the central portion of the wound which I'm gonna likely utilize silver nitrate on today. Subsequently he also keeps having a leak at the 6 o'clock location which is unfortunate we may be able to help out with some suggestions to try to prevent this going forward. Fortunately there's no signs of active infection at this time. 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 He's been tolerating the dressing changes without complication. Fortunately there's no signs of active infection at this time. I've been rather pleased with how things have progressed there still some evidence of pressure getting to the area with some redness right around the immediate wound opening. Nonetheless other than this I'm not seeing any significant complications or issues the wound is somewhat hyper granular. Upon discussing with the patient and his wife today I'm not sure that the wound is being packed to the base with the foam at this point. And if it's not been packed fully that may be part of the reason why is not seen as much improvement as far as the granulation from the base out. Again we do not want pack too tightly but we need some of the firm to get to the base of the wound. I discussed this with patient and his wife today. Patient History Information obtained from Patient. Family History Hypertension - Father, Stroke - Mother, No family history of Cancer, Diabetes, Heart Disease, Kidney Disease, Lung Disease, Seizures, Thyroid Problems, Tuberculosis. Social History Never smoker, Marital Status - Married, Alcohol Use - Never, Drug Use - No History, Caffeine Use - Daily. Medical History Eyes Patient has history of Cataracts - both removed Denies history of Glaucoma, Optic  Neuritis Ear/Nose/Mouth/Throat Denies history of Chronic sinus problems/congestion, Middle ear problems Hematologic/Lymphatic Denies history of Anemia, Hemophilia, Human Immunodeficiency Virus, Lymphedema Respiratory Denies history of Aspiration, Asthma, Chronic Obstructive Pulmonary Disease (COPD), Pneumothorax, Sleep Apnea, Tuberculosis Cardiovascular Patient has history of Hypertension - takes medication Denies history of Angina, Arrhythmia, Congestive Heart Failure, Coronary Artery Disease, Deep Vein Thrombosis, Hypotension, Myocardial Infarction, Peripheral Arterial Disease, Peripheral Venous Disease, Phlebitis, Vasculitis Gastrointestinal Denies history of Cirrhosis , Colitis, Crohn s, Hepatitis A, Hepatitis B, Hepatitis C Endocrine Denies history of Type I Diabetes, Type II Diabetes Genitourinary Denies history of End Stage Renal Disease Immunological Denies history of Lupus Erythematosus, Raynaud s, Scleroderma Integumentary (Skin) Denies history of History of Burn, History of pressure wounds Castorena, Nicoli J. ([id number]) Musculoskeletal Denies history of Gout, Rheumatoid Arthritis, Osteoarthritis, Osteomyelitis Neurologic Patient has history of Paraplegia - waist down Denies history of Dementia, Neuropathy, Quadriplegia, Seizure Disorder Oncologic Denies history of Received Chemotherapy, Received Radiation Psychiatric Denies history of Anorexia/bulimia, Confinement Anxiety Medical And Surgical History Notes Oncologic Prostate cancer- currently treated with horomone therapy Review of Systems (ROS) Constitutional Symptoms (General Health) Denies complaints or symptoms of Fatigue, Fever, Chills, Marked Weight Change. Respiratory Denies complaints or symptoms of Chronic or frequent coughs, Shortness of Breath. Cardiovascular Denies complaints or symptoms of Chest pain, LE edema. Psychiatric Denies complaints or symptoms of Anxiety,  Claustrophobia. Objective Constitutional Well-nourished and well-hydrated in no acute distress. Vitals  Time Taken: 8:18 AM, Height: 73 in, Weight: 210 lbs, BMI: 27.7, Temperature: 98.2 F, Pulse: 82 bpm, Respiratory Rate: 16 breaths/min, Blood Pressure: 159/81 mmHg. Respiratory normal breathing without difficulty. clear to auscultation bilaterally. Cardiovascular regular rate and rhythm with normal S1, S2. Psychiatric this patient is able to make decisions and demonstrates good insight into disease process. Alert and Oriented x 3. pleasant and cooperative. General Notes: Patient's wound bed again did show hyper granular tissue I did therefore go ahead and initiate treatment with silver nitrate to try to help out with the hyper granulation. He tolerated treatment today without complication post treatment seems to be doing quite well. Overall very pleased with the progress and how things have progressed up to this point. My hope is that we may be able to get things even better if we can indeed improve getting some of the foam to the base of his wound. Integumentary (Hair, Skin) [name], [name] ([id number]) Wound #1 status is Open. Original cause of wound was Pressure Injury. The wound is located on the Right Gluteal fold. The wound measures 2.5cm length x 2cm width x 3.2cm depth; 3.927cm^2 area and 12.566cm^3 volume. There is muscle and Fat Layer (Subcutaneous Tissue) Exposed exposed. There is no undermining noted, however, there is tunneling at 2:00 with a maximum distance of 5.4cm. There is a large amount of sanguinous drainage noted. The wound margin is epibole. There is large (67-100%) red, pink, hyper - granulation within the wound bed. There is a small (1-33%) amount of necrotic tissue within the wound bed including Adherent Slough. The periwound skin appearance exhibited: Maceration, Erythema. The periwound skin appearance did not exhibit: Callus, Crepitus, Excoriation,  Induration, Rash, Scarring, Dry/Scaly, Atrophie Blanche, Cyanosis, Ecchymosis, Hemosiderin Staining, Mottled, Pallor, Rubor. The surrounding wound skin color is noted with erythema which is circumferential. Periwound temperature was noted as No Abnormality. The periwound has tenderness on palpation. 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 An CHEM CAUT GRANULATION TISS procedure was performed by [name], HOYT E., [name]. Post procedure Diagnosis Wound #1: Same as Pre-Procedure Plan Wound Cleansing: Wound #1 Right Gluteal fold: Cleanse wound with mild soap and water Anesthetic (add to Medication List): Wound #1 Right Gluteal fold: Topical Lidocaine 4% cream applied to wound bed prior to debridement (In Clinic Only). Primary Wound Dressing: Wound #1 Right Gluteal fold: Dry Gauze - wet to dry in clinic until [facility] restarts NPWT Secondary Dressing: Wound #1 Right Gluteal fold: Boardered Foam Dressing - in clinic until [facility] restarts NPWT Dressing Change Frequency: Wound #1 Right Gluteal fold: JUSTAN, GAEDE ([id number]) Change Dressing Monday, Wednesday, Friday - and as needed Follow-up Appointments: Wound #1 Right Gluteal fold: Return Appointment in 1 week. Off-Loading: Wound #1 Right Gluteal fold: Mattress - Please continue air mattress at SNF Turn and reposition every 2 hours Home Health: Wound #1 Right Gluteal fold: Pierpoint Nurse may visit PRN to address patient s wound care needs. FACE TO FACE ENCOUNTER: MEDICARE and MEDICAID PATIENTS: I certify that this patient is under my care and that I had a face-to-face encounter that meets the physician  face-to-face encounter requirements with this patient on this date. The encounter with the patient was in whole or in part for the  following MEDICAL CONDITION: (primary reason for Home Healthcare) MEDICAL NECESSITY: I certify, that based on my findings, NURSING services are a medically necessary home health service. HOME BOUND STATUS: I certify that my clinical findings support that this patient is homebound (i.e., Due to illness or injury, pt requires aid of supportive devices such as crutches, cane, wheelchairs, walkers, the use of special transportation or the assistance of another person to leave their place of residence. There is a normal inability to leave the home and doing so requires considerable and taxing effort. Other absences are for medical reasons / religious services and are infrequent or of short duration when for other reasons). If current dressing causes regression in wound condition, may D/C ordered dressing product/s and apply Normal Saline Moist Dressing daily until next [place] / Other MD appointment. Carey of regression in wound condition at [phone]. Please direct any NON-WOUND related issues/requests for orders to patient's Primary Care Physician Negative Pressure Wound Therapy: Wound #1 Right Gluteal fold: Wound VAC settings at 125/130 mmHg continuous pressure. Use BLACK/GREEN foam to wound cavity. Use WHITE foam to fill any tunnel/s and/or undermining. Change VAC dressing 3 X WEEK. Change canister as indicated when full. Nurse may titrate settings and frequency of dressing changes as clinically indicated. - PLEASE, apply Silver Collagen to line the base of the wound. Black foam only! Lightly pack foam into 12 0'clock tunnel (4cm.), then place second piece of foam over wound opening. Please use and order for patient Duoderm to peri wound to help drape seal better. Thank you. Home Health Nurse may d/c VAC for s/s of increased  infection, significant wound regression, or uncontrolled drainage. [place] at [phone]. Number of foam/gauze pieces used in the dressing = At this point my suggestion is gonna be that we go ahead and continue with the above wound care measures we did make a note to ensure the home health is putting a piece of foam down into the base of the wound which I think is of utmost importance as far as getting this to function appropriately and perform how we want it to perform. If anything changes or worsens in the meantime he will contact the office and let me know. Please see above for specific wound care orders. We will see patient for re-evaluation in 1 week(s) here in the clinic. If anything worsens or changes patient will contact our office for additional recommendations. Electronic Signature(s) Signed: 11/29/2018 5:03:42 PM By: Worthy Keeler [name] Entered By: Worthy Keeler on 11/29/2018 10:03:26 LEAF, KERNODLE ([id number]) -------------------------------------------------------------------------------- ROS/PFSH Details Patient Name: [name] Date of Service: 11/29/2018 8:15 AM Medical Record Number: [id number] Patient Account Number: [id number] Date of Birth/Sex: June 17, 1951 (68 y.o. M) Treating RN: Harold Barban Primary Care Provider: [name] Other Clinician: Referring Provider: [name] Treating Provider/Extender: [name], HOYT Weeks in Treatment: 56 Information Obtained From Patient Constitutional Symptoms (General Health) Complaints and Symptoms: Negative for: Fatigue; Fever; Chills; Marked Weight Change Respiratory Complaints and Symptoms: Negative for: Chronic or frequent coughs; Shortness of Breath Medical History: Negative for: Aspiration; Asthma; Chronic Obstructive Pulmonary Disease (COPD); Pneumothorax; Sleep Apnea; Tuberculosis Cardiovascular Complaints and Symptoms: Negative for: Chest pain; LE edema Medical  History: Positive for: Hypertension - takes medication Negative for: Angina; Arrhythmia; Congestive Heart Failure; Coronary Artery Disease; Deep Vein Thrombosis; Hypotension; Myocardial Infarction; Peripheral Arterial Disease; Peripheral Venous Disease; Phlebitis; Vasculitis Psychiatric Complaints and Symptoms: Negative for: Anxiety; Claustrophobia Medical History: Negative for: Anorexia/bulimia;  Confinement Anxiety Eyes Medical History: Positive for: Cataracts - both removed Negative for: Glaucoma; Optic Neuritis Ear/Nose/Mouth/Throat Medical History: Negative for: Chronic sinus problems/congestion; Middle ear problems Hematologic/Lymphatic Medical History: Negative for: Anemia; Hemophilia; Human Immunodeficiency Virus; Lymphedema FARRELL, PANTALEO. ([id number]) Gastrointestinal Medical History: Negative for: Cirrhosis ; Colitis; Crohnos; Hepatitis A; Hepatitis B; Hepatitis C Endocrine Medical History: Negative for: Type I Diabetes; Type II Diabetes Genitourinary Medical History: Negative for: End Stage Renal Disease Immunological Medical History: Negative for: Lupus Erythematosus; Raynaudos; Scleroderma Integumentary (Skin) Medical History: Negative for: History of Burn; History of pressure wounds Musculoskeletal Medical History: Negative for: Gout; Rheumatoid Arthritis; Osteoarthritis; Osteomyelitis Neurologic Medical History: Positive for: Paraplegia - waist down Negative for: Dementia; Neuropathy; Quadriplegia; Seizure Disorder Oncologic Medical History: Negative for: Received Chemotherapy; Received Radiation Past Medical History Notes: Prostate cancer- currently treated with horomone therapy HBO Extended History Items Eyes: Cataracts Immunizations Pneumococcal Vaccine: Received Pneumococcal Vaccination: No Implantable Devices No devices added Family and Social History Cancer: No; Diabetes: No; Heart Disease: No; Hypertension: Yes - Father; Kidney  Disease: No; Lung Disease: No; Seizures: No; Stroke: Yes - Mother; Thyroid Problems: No; Tuberculosis: No; Never smoker; Marital Status - Married; Alcohol Use: Never; Drug Use: No History; Caffeine Use: Daily [name], [name] ([id number]) Physician Affirmation I have reviewed and agree with the above information. Electronic Signature(s) Signed: 11/29/2018 4:53:18 PM By: Harold Barban Signed: 11/29/2018 5:03:42 PM By: Worthy Keeler [name] Entered By: Worthy Keeler on 11/29/2018 10:01:46 FARID, GRIGORIAN ([id number]) -------------------------------------------------------------------------------- SuperBill Details Patient Name: [name] Date of Service: 11/29/2018 Medical Record Number: [id number] Patient Account Number: [id number] Date of Birth/Sex: [birth year] (68 y.o. 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

## 2018-12-06 ENCOUNTER — Encounter: Payer: Medicare Other | Admitting: Physician Assistant

## 2018-12-06 ENCOUNTER — Other Ambulatory Visit: Payer: Self-pay

## 2018-12-06 DIAGNOSIS — L89314 Pressure ulcer of right buttock, stage 4: Secondary | ICD-10-CM | POA: Diagnosis not present

## 2018-12-07 NOTE — Progress Notes (Signed)
[name], [name] ([id number]) Visit Report for 12/06/2018 Chief Complaint Document Details Patient Name: [name], [name] Date of Service: 12/06/2018 8:00 AM Medical Record Number: [id number] Patient Account Number: [id number] Date of Birth/Sex: [birth year] (68 y.o. M) Treating RN: Harold Barban Primary Care Provider: [name] Other Clinician: Referring Provider: [name] Treating Provider/Extender: [name], Geovany Trudo Weeks in Treatment: 78 Information Obtained from: Patient Chief Complaint Right gluteal fold ulcer Electronic Signature(s) Signed: 12/07/2018 1:42:22 PM By: Worthy Keeler [name] Entered By: Worthy Keeler on 12/06/2018 08:17:41 [name], [name] ([id number]) -------------------------------------------------------------------------------- HPI Details Patient Name: [name] Date of Service: 12/06/2018 8:00 AM Medical Record Number: [id number] Patient Account Number: [id number] Date of Birth/Sex: Aug 09, 1950 (68 y.o. 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 He states that there is no history of diabetes that he is aware of. He does have issues with his prostate and is currently receiving treatment for this by way of oral medication. With that being said I do not have a lot of details in that regard. Nonetheless the patient presents today as a result of having been referred to [place] by another provider initially home health was set to come out and take care of his wound although due to the fact that  he apparently drives he's not able to receive home health. His wife is therefore trying to help take care of this wound within although they have been struggling with what exactly to do at this point. She states that she can do some things but she is definitely not a nurse and does have some issues with looking at blood. The good news is the wound does not appear to be too deep and is fairly superficial at this point. There is no slough noted there is some nonviable skin noted around the surface of the wound and the perimeter at this point. The central portion of the wound appears to be very good with a dermal layer noted this does not appear to be again deep enough to extend it to subcutaneous tissue at this point. 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 In the pump he has Dilaudid and baclofen. 12/07/17 on evaluation today patient presents for follow-up concerning his ongoing lower back thigh ulcer on the right. He states that he did not get the supplies ordered and therefore has not really been able to perform the dressing changes as directed exactly. His wife was able to get some Boarder Foam Dressing's from the drugstore and subsequently has been using hydrogel which did help to a degree in the wound does appear to be able smaller. There is actually more drainage this week noted than previous. 12/21/17 on evaluation today patient appears to be doing rather well in regard to his right gluteal ulcer. He has been tolerating the dressing changes without complication. There does not appear to be any evidence of infection at this point in time. Overall the wound does seem to be making some progress as far as the edges  are concerned there's not as much in the way of overlapping of the external wound edges and he has a good epithelium to wound bed border for the most part. This however is not true  right at the 12 o'clock location over the span of a little over a centimeters which actually will require debridement today to clean this away and hopefully allow it to continue to heal more appropriately. 12/28/17 on evaluation today patient appears to be doing rather well in regard to his ulcer in the left gluteal region. He's been tolerating the dressing changes without complication. Apparently he has had some difficulty getting his dressing material. Apparently there's been some confusion with ordering we're gonna check into this. Nonetheless overall he's been showing signs of improvement which is good news. Debridement is not required today. 01/04/18 on evaluation today patient presents for follow-up concerning his right gluteal ulcer. He has been tolerating the dressing changes fairly well. On inspection today it appears he may actually have some maceration them concerned about the fact that he may be developing too much moisture in and around the wound bed which can cause delay in healing. With that being said he unfortunately really has not showed significant signs of improvement since last week's evaluation in fact this may even be just the little bit/slightly larger. Nonetheless he's been having a lot of discomfort I'm not sure this is even related to the wound as he has no pain when I'm to breeding or otherwise cleaning the wound during evaluation today. Nonetheless this is something that we did recommend he talked to his pain specialist concerning. 01/11/18 on evaluation today patient appears to be doing better in regard to his ulceration. He has been tolerating the dressing changes without complication. With that being said overall there's no evidence of infection which is good news. The only thing is he did receive the hatch affair blue classic versus the ready nonetheless I feel like this is perfectly fine and appears to have done well for him over the past week. 01/25/18 on evaluation  today patient's wound actually appears to be a little bit larger than during the last evaluation. The good news is the majority of the wound edges actually appear to be fairly firmly attached to the wound bed unfortunately again we're not really making progress in regard to the size. Roughly the wound is about the same size as when I first saw him although again the wound margin/edges appear to be much better. 02/01/18 on evaluation today patient actually appears to be doing very well in regard to his wound. Applying the Prisma dry does seem to be better although he does still have issues with slow progression of the wound. There was a slight improvement compared to last week's measurements today. Nonetheless I have been considering other options as far as the possibility of Theraskin or even a snap vac. In general I'm not sure that the Theraskin due to location of the wound would be a [name], [name]. ([id number]) very good idea. Nonetheless I do think that a snap vac could be a possibility for the patient and in fact I think this could even be an excellent way to manage the wound possibly seeing some improvement in a very rapid fashion here. Nonetheless this is something that we would need to get approved and I did have a lengthy conversation with the patient about this today. 02/08/18 on evaluation today patient appears to be doing a little better in regard to his ulcer.  He has been tolerating the dressing changes without complication. Fortunately despite the fact that the wound is a little bit smaller it's not significantly so unfortunately. We have discussed the possibility of a snap vac we did check with insurance this is actually covered at this point. Fortunately there does not appear to be any sign of infection. Overall I'm fairly pleased with how things seem to be appearing at this point. 02/15/18 on evaluation today patient appears to be doing rather well in regard to his right gluteal  ulcer. Unfortunately the snap vac did not stay in place with his sheer and friction this came loose and did not seem to maintain seal very well. He worked for about two days and it did seem to do very well during that time according to his wife but in general this does not seem to be something that's gonna be beneficial for him long-term. I do believe we need to go back to standard dressings to see if we can find something that will be of benefit. 03/02/18- He is here in follow up evaluation; there is minimal change in the wound. He will continue with the same treatment plan, would consider changing to iodosrob/iodoflex if ulcer continues to to plateau. He will follow up next week 03/08/18 on evaluation today patient's wound actually appears to be about the same size as when I previously saw him several weeks back. Unfortunately he does have some slightly dark discoloration in the central portion of the wound which has me concerned about pressure injury. I do believe he may be sitting for too long a period of time in fact he tells me that "I probably sit for much too long". He does have some Slough noted on the surface of the wound and again as far as the size of the wound is concerned I'm really not seeing anything that seems to have improved significantly. 03/15/18 on evaluation today patient appears to be doing fairly well in regard to his ulcer. The wound measured pretty much about the same today compared to last week's evaluation when looking at his graph. With that being said the area of bruising/deep tissue injury that was noted last week I do not see at this point. He did get a new cushion fortunately this does seem to be have been of benefit in my pinion. It does appear that he's been off of this more which is good news as well I think that is definitely showing in the overall wound measurements. With that being said I do believe that he needs to continue to offload I don't think that the fact  this is doing better should be or is going to allow him to not have to offload and explain this to him as well. Overall he seems to be in agreement the plan I think he understands. The overall appearance of the wound bed is improved compared to last week I think the Iodoflex has been beneficial in that regard. 03/29/18 on evaluation today patient actually appears to be doing rather well in regard to his wound from the overall appearance standpoint he does have some granulation although there's some Slough on the surface of the wound noted as well. With that being said he unfortunately has not improved in regard to the overall measurement of the wound in volume or in size. I did have a discussion with him very specifically about offloading today. He actually does work although he mainly is just sitting throughout the day. He tells me  he offloads by "lifting himself up for 30 seconds off of his chair occasionally" purchase from [employer] which does seem to have helped. And he has a new cushion that he with that being said he's also able to stand some for a very short period of time but not significant enough I think to provide appropriate offloading. I think the biggest issue at this point with the wound and the fact is not healing as quickly as we would like is due to the fact that he is really not able to appropriately offload while at work. He states the beginning after his injury he actually had a bed at his job that he could lay on in order to offload and that does seem to have been of help back at that time. Nonetheless he had not done this in quite some time unfortunately. I think that could be helpful for him this is something I would like for him to look into. 04/05/18 on evaluation today patient actually presents for follow-up concerning his right gluteal ulcer. Again he really is not significantly improved even compared to last week. He has been tolerating the dressing changes without  complication. With that being said fortunately there appears to be no evidence of infection at this time. He has been more proactive in trying to offload. 04/12/18 on evaluation today patient actually appears to be doing a little better in regard to his wound and the right gluteal fold region. He's been tolerating the dressing changes since removing the oasis without complication. However he was having a lot of burning initially with the oasis in place. He's unsure of exactly why this was given so much discomfort but he assumes that it was the oasis itself causing the problem. Nonetheless this had to be removed after about three days in place although even those three days seem to have made a fairly good improvement in regard to the overall appearance of the wound bed. In fact is the first time that he's made any improvement from the standpoint of measurements in about six weeks. He continues to have no discomfort over the area of the wound itself which leads me to wonder why he was having the burning with the oasis when he does not even feel the actual debridement's themselves. I am somewhat perplexed by this. 04/19/18 on evaluation today patient's wound actually appears to be showing signs of epithelialization around the edge of the wound and in general actually appears to be doing better which is good news. He did have the same burning after about three days with applying the Endoform last week in the same fashion that I would generally apply a skin substitute. This seems to indicate that it's not the oasis to cause the problem but potentially the moisture buildup that just causes things to burn or there may be some other reaction with the skin prep or Steri-Strips. Nonetheless I'm not sure that is gonna be able to tolerate any skin substitute for a long period of time. The good news is the wound actually appears to be doing better today compared to last week and does seem to finally be making some  progress. [name], [name] ([id number]) 04/26/18 on evaluation today patient actually appears to be doing rather well in regard to his ulcer in the right gluteal fold. He has been tolerating the dressing changes without complication which is good news. The Endoform does seem to be helping although he was a little bit more macerated this week. This  seems to be an ongoing issue with fluid control at this point. Nonetheless I think we may be able to add something like Drawtex to help control the drainage. 05/03/18 on evaluation today patient appears to actually be doing better in regard to the overall appearance of his wound. He has been tolerating the dressing changes without complication. Fortunately there appears to be no evidence of infection at this time. I really feel like his wound has shown signs as of today of turning around last week I thought so as well and definitely he could be seen in this week's overall appearance and measurements. In general I'm very pleased with the fact that he finally seems to be making a steady but sure progress. The patient likewise is very pleased. 05/17/18 on evaluation today patient appears to be doing more poorly unfortunately in regard to his ulcer. He has been tolerating the dressing changes without complication. With that being said he tells me that in the past couple of days he and his wife have noticed that we did not seem to be doing quite as well is getting dark near the center. Subsequently upon evaluation today the wound actually does appear to be doing worse compared to previous. He has been tolerating the dressing changes otherwise and he states that he is not been sitting up anymore than he was in the past from what he tells me. Still he has continued to work he states "I'm tired of dealing with this and if I have to just go home and lay in the bed all the time that's what I'll do". Nonetheless I am concerned about the fact that this wound does  appear to be deeper than what it was previous. 05/24/18 upon evaluation today patient actually presents after having been in the hospital due to what was presumed to be sepsis secondary to the wound infection. He had an elevated white blood cell count between 14 and 15. With that being said he does seem to be doing somewhat better now. His wound still is giving him some trouble nonetheless and he is obviously concerned about the fact likely talked about that this does seem to go more deeply than previously noted. I did review his wound culture which showed evidence of Staphylococcus aureus him and group B strep. Nonetheless he is on antibiotics, Levaquin, for this. Subsequently I did review his intake summary from the hospital as well. I also did look at the CT of the lumbar spine with contrast that was performed which showed no bone destruction to suggest lumbar disguises/osteomyelitis or sacral osteomyelitis. There was no paraspinal abscess. Nonetheless it appears this may have been more of just a soft tissue infection at this point which is good news. He still is nonetheless concerned about the wound which again I think is completely reasonable considering everything he's been through recently. 05/31/18 on evaluation today on evaluation today patient actually appears to be showing signs of his wound be a little bit deeper than what I would like to see. Fortunately he does not show any signs of significant infection although his temperature was 99 today he states he's been checking this at home and has not been elevated. Nonetheless with the undermining that I'm seeing at this point I am becoming more concerned about the wound I do think that offloading is a key factor here that is preventing the speedy recovery at this point. There does not appear to be any evidence of again over infection noted. He's been using  Santyl currently. 06/07/18 the patient presents today for follow-up evaluation  regarding the left ulcer in the gluteal region. He has been tolerating the Wound VAC fairly well. He is obviously very frustrated with this he states that to mean is really getting in his way. There does not appear to be any evidence of infection at this time he does have a little bit of odor I do not necessarily associate this with infection just something that we sometimes notice with Wound VAC therapy. With that being said I can definitely catch a tone of discontentment overall in the patient's demeanor today. This when he was previously in the hospital an CT scan was done of the lumbar region which did not reveal any signs of osteomyelitis. With that being said the pelvis in particular was not evaluated distinctly which means he could still have some osteonecrosis I. Nonetheless the Wound VAC was started on Thursday I do want to get this little bit more time before jumping to a CT scan of the pelvis although that is something that I might would recommend if were not see an improvement by that time. 06/14/18 on evaluation today patient actually appears to be doing about the same in regard to his right gluteal ulcer. Again he did have a CT scan of the lumbar spine unfortunately this did not include the pelvis. Nonetheless with the depth of the wound that I'm seeing today even despite the fact that I'm not seeing any evidence of overt cellulitis I believe there's a good chance that we may be dealing with osteomyelitis somewhere in the right Ischial region. No fevers, chills, nausea, or vomiting noted at this time. 06/21/18 on evaluation today patient actually appears to be doing about the same with regard to his wound. The tunnel at 6 o'clock really does not appear to be any deeper although it is a little bit wider. I think at this point you may want to start packing this with white phone. Unfortunately I have not got approval for the CT scan of the pelvis as of yet due to the fact that Medicare  apparently has been denied it due to the diagnosis codes not being appropriate according to Medicare for the test requested. With that being said the patient cannot have an MRI and therefore this is the only option that we have as far as testing is concerned. The patient has had infection and was on antibiotics and been added code for cellulitis of the bottom to see if this will be appropriate for getting the test approved. Nonetheless I'm concerned about the infection have been spread deeper into the Ischial region. 06/28/18 on evaluation today patient actually appears to be doing rather well all things considered in regard to the right gluteal ulcer. He has been tolerating the dressing changes without complication. With that being said the Wound VAC he states does have to be replaced almost every day or at least reinforced unfortunately. Patient actually has his CT scan later this morning we should have the results by tomorrow. [name], [name] ([id number]) 07/05/18 on evaluation today patient presents for follow-up concerning his right Ischial ulcer. He did see the surgeon Dr. Lysle Pearl last week. They were actually very happy with him and felt like he spent a tremendous amount of time with them as far as discussing his situation was concerned. In the end Dr. Lysle Pearl did contact me as well and determine that he would not recommend any surgical intervention at this point as he felt like  it would not be in the patient's best interest based on what he was seeing. He recommended a referral to infectious disease. Subsequently this is something that Dr. Ines Bloomer office is working on setting up for the patient. As far as evaluation today is concerned the patient's wound actually appears to be worse at this point. I am concerned about how things are progressing and specifically about infection. I do not feel like it's the deeper but the area of depth is definitely widening which does have me concerned. No  fevers, chills, nausea, or vomiting noted at this time. I think that we do need initiate antibiotic therapy the patient has an allow allergy to amoxicillin/penicillin he states that he gets a rash since childhood. Nonetheless she's never had the issues with Catholics or cephalosporins in general but he is aware of. 07/27/18 on evaluation today patient presents following admission to the hospital on 07/09/18. He was subsequently discharged on 07/20/18. On 07/15/18 the patient underwent irrigation and debridement was soft tissue biopsy and bone biopsy as well as placement of a Wound VAC in the OR by Dr. Celine Ahr. During the hospital course the patient was placed on a Wound VAC and recommended follow up with surgery in three weeks actually with Dr. Delaine Lame who is infectious disease. The patient was on vancomycin during the hospital course. 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 He also had a bone culture which revealed evidence of methicillin-resistant staph aureus. He is updated CT scan 07/09/18 reveals that he had progression of the which was performed on wound to breakdown down to the trochanter where he actually had irregularities there as well suggestive of osteomyelitis. This was a change just since 9 December when we last performed a CT scan. Obviously this one had gone downhill quite significantly and rapidly. At this point upon evaluation I feel like in general the patient's wound seems to be doing fairly well all things considered upon my evaluation today. Obviously this is larger and deeper than what I previously evaluated but at the same time he seems to be making some progress as far as the appearance of the granulation tissue is concerned. I'm happy in that regard. No fevers, chills, nausea, or vomiting noted at this time. He is on IV vancomycin and Rocephin at the facility. He is currently in [employer]. 08/03/18 upon evaluation today  patient's wound appears to be doing better in regard to the overall appearance at this point in time. Fortunately he's been tolerating the Wound VAC without complication and states that the facility has been taking excellent care of the wound site. Overall I see some Slough noted on the surface which I am going to attempt sharp debridement today of but nonetheless other than this I feel like he's making progress. 08/09/18 on evaluation today patient's wound appears to be doing much better compared to even last week's evaluation. Do believe that the Wound VAC is been of great benefit for him. He has been tolerating the dressing changes that is the Wound VAC without any complication and he has excellent granulation noted currently. There is no need for sharp debridement at this point. 08/16/18 on evaluation today patient actually appears to be doing very well in regard to the wound in the right gluteal fold region. This is showing signs of progress and again appears to be very healthy which is excellent news. Fortunately there is no sign of active infection by way of odor or drainage at this  point. Overall I'm very pleased with how things stand. He seems to be tolerating the Wound VAC without complication. 08/23/18 on evaluation today patient actually appears to be doing better in regard to his wound. He has been tolerating the Wound VAC without complication and in fact it has been collecting a significant amount of drainage which I think is good news especially considering how the wound appears. Fortunately there is no signs of infection at this time definitely nothing appears to be worse which is good news. He has not been started on the Bactrim and Flagyl that was recommended by Dr. Delaine Lame yet. I did actually contact her office this morning in order to check and see were things are that regard their gonna be calling me back. 08/30/18 on evaluation today patient actually appears to show signs of  excellent improvement today compared to last evaluation. The undermining is getting much better the wound seems to be feeling quite nicely and I'm very pleased that the granulation in general. With that being said overall I feel like the patient has made excellent progress which is great news. No fevers, chills, nausea, or vomiting noted at this time. 09/06/18 on evaluation today patient actually appears to be doing rather well in regard to his right gluteal ulcer. This is showing signs of improvement in overall I'm very pleased with how things seem to be progressing. The patient likewise is please. Overall I see no evidence of infection he is about to complete his oral antibiotic regimen which is the end of the antibiotics for him in just about three days. 09/13/18 on evaluation today patient's right Ischial ulcer appears to be showing signs of continued improvement which is excellent news. He's been tolerating the dressing changes without complication. Fortunately there's no signs of infection and the wound that seems to be doing very well. 09/28/18 on evaluation today patient appears to be doing rather well in regard to his right Ischial ulcer. He's been tolerating the Wound VAC without complication he knows there's much less drainage than there used to be this obviously is not a bad thing in my pinion. There's no evidence of infection despite the fact is but nothing about it now for several weeks. [name], [name] ([id number]) 10/04/18 on evaluation today patient appears to be doing better in regard to his right Ischial wound. He has been tolerating the Wound VAC without complication and I do believe that the silver nitrate last week was beneficial for him. Fortunately overall there's no evidence of active infection at this time which is great news. No fevers, chills, nausea, or vomiting noted at this time. 10/11/18 on evaluation today patient actually appears to be doing rather well in regard  to his Ischial ulcer. He's been tolerating the Wound VAC still without complication I feel like this is doing a good job. No fevers, chills, nausea, or vomiting noted at this time. 11/01/18 on evaluation today patient presents after having not been seen in our clinic for several weeks secondary to the fact that he was on evaluation today patient presents after having not been seen in our clinic for several weeks secondary to the fact that he was in a skilled nursing facility which was on lockdown currently due to the covert 19 national emergency. Subsequently he was discharged from the facility on this past Friday and subsequently made an appointment to come in to see yesterday. Fortunately there's no signs of active infection at this time which is good news and overall he does  seem to have made progress since I last saw. Overall I feel like things are progressing quite nicely. The patient is having no pain. 11/08/18 on evaluation today patient appears to be doing okay in regard to his right gluteal ulcer. He has been utilizing a Wound VAC home health this changing this at this point since he's home from the skilled nursing facility. Fortunately there's no signs of obvious active infection at this time. Unfortunately though there's no obvious active infection he is having some maceration and his wife states that when the sheets of the Wound VAC office on Sunday when it broke seal that he ended up having significant issues with some smell as well there concerned about the possibility of infection. Fortunately there's No fevers, chills, nausea, or vomiting noted at this time. 11/15/18 on evaluation today patient actually appears to be doing well in regard to his right gluteal ulcer. He has been tolerating the dressing changes without complication. Specifically the Wound VAC has been utilized up to this point. Fortunately there's no signs of infection and overall I feel like he has made progress even  since last week when I last saw him. I'm actually fairly happy with the overall appearance although he does seem to have somewhat of a hyper granular overgrowth in the central portion of the wound which I think may require some sharp debridement to try flatness out possibly utilizing chemical cauterization following. 11/23/18 on evaluation today patient actually appears to be doing very well in regard to his sacral ulcer. He seems to be showing signs of improvement with good granulation. With that being said he still has the small area of hyper granulation right in the central portion of the wound which I'm gonna likely utilize silver nitrate on today. Subsequently he also keeps having a leak at the 6 o'clock location which is unfortunate we may be able to help out with some suggestions to try to prevent this going forward. Fortunately there's no signs of active infection at this time. 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 He's been tolerating the dressing changes without complication. Fortunately there's no signs of active infection at this time. I've been rather pleased with how things have progressed there still some evidence of pressure getting to the area with some redness right around the immediate wound opening. Nonetheless other than this I'm not seeing any significant complications or issues the wound is somewhat hyper granular. Upon discussing with the patient and his wife today I'm not sure that the wound is being packed to the base with the foam at this point. And if it's not been packed fully that may be part of the reason why is not seen as much improvement as far as the granulation from the base out. Again we do not want pack too tightly but we need some of the firm to get to the base of the wound. I discussed this with patient and his wife today. 12/06/18 on evaluation today patient  appears to be doing well in regard to his right gluteal pressure ulcer. He's been tolerating the dressing changes without complication. Fortunately there's no signs of active infection. He still has some hyper granular tissue and I do think it would be appropriate to continue with the chemical cauterization as of today. Electronic Signature(s) Signed: 12/07/2018 1:42:22 PM By: Worthy Keeler [name] Entered By: Worthy Keeler on 12/06/2018 08:42:22 [place],  MONTE ZINNI ([id number]) -------------------------------------------------------------------------------- [facility] TISS Details Patient Name: SHADOW, SCHEDLER Date of Service: 12/06/2018 8:00 AM Medical Record Number: [id number] Patient Account Number: [id number] Date of Birth/Sex: 05/10/1951 (68 y.o. M) Treating RN: Harold Barban Primary Care Provider: [name] Other Clinician: Referring Provider: [name] Treating Provider/Extender: [name], Elliett Guarisco Weeks in Treatment: 45 Procedure Performed for: Wound #1 Right Gluteal fold Performed By: Physician Emilio Math., [name] Post Procedure Diagnosis Same as Pre-procedure Electronic Signature(s) Signed: 12/06/2018 4:38:46 PM By: Harold Barban Entered By: Harold Barban on 12/06/2018 08:34:13 [name] ([id number]) -------------------------------------------------------------------------------- Physical Exam Details Patient Name: [name] Date of Service: 12/06/2018 8:00 AM Medical Record Number: [id number] Patient Account Number: [id number] Date of Birth/Sex: [birth year] (68 y.o. M) Treating RN: Harold Barban Primary Care Provider: [name] Other Clinician: Referring Provider: [name] Treating Provider/Extender: [name], Khandi Kernes Weeks in Treatment: 30 Constitutional Well-nourished and well-hydrated in no acute distress. Respiratory normal breathing without difficulty. Psychiatric this patient is able to  make decisions and demonstrates good insight into disease process. Alert and Oriented x 3. pleasant and cooperative. Notes Patient's wound bed currently showed signs of good granulation at this time it is not appear to be any evidence of active infection was some hyper granulation noted unfortunately. For that reason I did utilize three sticks of silver nitrate complete cauterized the surface of the wound internally which was tolerated without any pain or complications. He really does not have any discomfort right now in the wound region. Overall I feel like he is doing quite well. There's definitely no signs of infection. Electronic Signature(s) Signed: 12/07/2018 1:42:22 PM By: Worthy Keeler [name] Entered By: Worthy Keeler on 12/06/2018 08:43:09 [name], [name] ([id number]) -------------------------------------------------------------------------------- Physician Orders Details Patient Name: [name] Date of Service: 12/06/2018 8:00 AM Medical Record Number: [id number] Patient Account Number: [id number] Date of Birth/Sex: [birth year] (68 y.o. 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 Primary Wound Dressing Wound #1 Right Gluteal fold o Dry Gauze - wet to dry in clinic until [facility]  restarts NPWT Secondary Dressing Wound #1 Right Gluteal fold o Boardered Foam Dressing - in clinic until [facility] restarts NPWT Dressing Change Frequency Wound #1 Right Gluteal fold o Change Dressing Monday, Wednesday, Friday - and as needed Follow-up Appointments Wound #1 Right Gluteal fold o Return Appointment in 1 week. Off-Loading Wound #1 Right Gluteal fold o Mattress - Please continue air mattress at SNF o Turn and reposition every 2 hours [place] #1 Right Gluteal fold o [place] Nurse may visit PRN to address patientos wound care needs. o FACE TO FACE ENCOUNTER: MEDICARE and MEDICAID PATIENTS: I certify that this patient is under my care and that I had a face-to-face encounter that meets the physician face-to-face encounter requirements with this patient on this date. The encounter with the patient was in whole or  in part for the following MEDICAL CONDITION: (primary reason for Home Healthcare) MEDICAL NECESSITY: I certify, that based on my findings, NURSING services are a medically necessary home health service. HOME BOUND STATUS: I certify that my clinical findings support that this patient is homebound (i.e., Due to illness or injury, pt requires aid of supportive devices such as crutches, cane, wheelchairs, walkers, the use of special transportation or the [name], [name]. ([id number]) assistance of another person to leave their place of residence. There is a normal inability to leave the home and doing so requires considerable and taxing effort. Other absences are for medical reasons / religious services and are infrequent or of short duration when for other reasons). o If current dressing causes regression in wound condition, may D/C ordered dressing product/s and apply Normal Saline Moist Dressing daily until next [place] / Other MD appointment. [place] of regression in wound  condition at [phone]. o Please direct any NON-WOUND related issues/requests for orders to patient's Primary Care Physician Negative Pressure Wound Therapy Wound #1 Right Gluteal fold o Wound VAC settings at 125/130 mmHg continuous pressure. Use BLACK/GREEN foam to wound cavity. Use WHITE foam to fill any tunnel/s and/or undermining. Change VAC dressing 3 X WEEK. Change canister as indicated when full. Nurse may titrate settings and frequency of dressing changes as clinically indicated. - PLEASE, apply Silver Collagen to line the base of the wound. Black foam only! Lightly pack foam into 12 0'clock tunnel (4cm.), then place second piece of foam over wound opening. Please use and order for patient Duoderm to peri wound to help drape seal better. Thank you. o Home Health Nurse may d/c VAC for s/s of increased infection, significant wound regression, or uncontrolled drainage. Maria Antonia at [phone]. o Number of foam/gauze pieces used in the dressing = Electronic Signature(s) Signed: 12/06/2018 4:38:46 PM By: Harold Barban Signed: 12/07/2018 1:42:22 PM By: Worthy Keeler [name] Entered By: Harold Barban on 12/06/2018 08:35:33 [name], [name] ([id number]) -------------------------------------------------------------------------------- Problem List Details Patient Name: [name] Date of Service: 12/06/2018 8:00 AM Medical Record Number: [id number] Patient Account Number: [id number] Date of Birth/Sex: Nov 28, 1950 (68 y.o. M) Treating RN: Harold Barban Primary Care Provider: [name] Other Clinician: Referring Provider: [name] Treating Provider/Extender: [name], Kerney Hopfensperger Weeks in Treatment: 80 Active Problems ICD-10 Evaluated Encounter Code Description Active Date Today Diagnosis L89.314 Pressure ulcer of right buttock, stage 4 11/30/2017 No Yes L03.317 Cellulitis of buttock 06/21/2018 No Yes G82.20 Paraplegia, unspecified  11/30/2017 No Yes S34.109S Unspecified injury to unspecified level of lumbar spinal cord, 11/30/2017 No Yes sequela I10 Essential (primary) hypertension 11/30/2017 No Yes Inactive Problems Resolved Problems Electronic Signature(s) Signed: 12/07/2018 1:42:22 PM By: Worthy Keeler [name] Entered By: Worthy Keeler on 12/06/2018 08:17:26 [name] ([id number]) -------------------------------------------------------------------------------- Progress Note Details Patient Name: [name] Date of Service: 12/06/2018 8:00 AM Medical Record Number: [id number] Patient Account Number: [id number] Date of Birth/Sex: 1950-11-09 (68 y.o. 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 He  states that there is no history of diabetes that he is aware of. He does have issues with his prostate and is currently receiving treatment for this by way of oral medication. With that being said I do not have a lot of details in that regard. Nonetheless the patient presents today as a result of having been referred to [place] by another provider initially home health was set to come out and take care of his wound although due to the fact that he apparently drives he's not able to receive home health. His wife is therefore trying to help take care of this wound within although they have been struggling with what exactly to do at this point. She states that she can do some things but she is  definitely not a nurse and does have some issues with looking at blood. The good news is the wound does not appear to be too deep and is fairly superficial at this point. There is no slough noted there is some nonviable skin noted around the surface of the wound and the perimeter at this point. The central portion of the wound appears to be very good with a dermal layer noted this does not appear to be again deep enough to extend it to subcutaneous tissue at this point. 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 In the pump he has Dilaudid and baclofen. 12/07/17 on evaluation today patient presents for follow-up concerning his ongoing lower back thigh ulcer on the right. He states that he did not get the supplies ordered and therefore has not really been able to perform the dressing changes as directed exactly. His wife was able to get some Boarder Foam Dressing's from the drugstore and subsequently has been using hydrogel which did help to a degree in the wound does appear to be able smaller. There is actually more drainage this week noted than previous. 12/21/17 on evaluation today patient appears to be doing rather well in regard to his right gluteal ulcer. He has been tolerating the dressing changes without complication. There does not appear to be any evidence of infection at this point in time. Overall the wound does seem to be making some progress as far as the edges are concerned there's not as much in the way of overlapping of the external wound edges and he has a good epithelium to wound bed border for the most part. This however is not true right at the 12 o'clock location over the span of a little over a centimeters which actually will require debridement today to clean this away and hopefully allow it to continue to heal more appropriately. 12/28/17 on evaluation today patient appears  to be doing rather well in regard to his ulcer in the left gluteal region. He's been tolerating the dressing changes without complication. Apparently he has had some difficulty getting his dressing material. Apparently there's been some confusion with ordering we're gonna check into this. Nonetheless overall he's been showing signs of improvement which is good news. Debridement is not required today. 01/04/18 on evaluation today patient presents for follow-up concerning his right gluteal ulcer. He has been tolerating the dressing changes fairly well. On inspection today it appears he may actually have some maceration them concerned about the fact that he may be developing too much moisture in and around the wound bed which can cause delay in healing. With that being  said he unfortunately really has not showed significant signs of improvement since last week's evaluation in fact this may even be just the little bit/slightly larger. Nonetheless he's been having a lot of discomfort I'm not sure this is even related to the wound as he has no pain when I'm to breeding or otherwise cleaning the wound during evaluation today. Nonetheless this is something that we did recommend he talked to his pain specialist concerning. 01/11/18 on evaluation today patient appears to be doing better in regard to his ulceration. He has been tolerating the dressing changes without complication. With that being said overall there's no evidence of infection which is good news. The only thing is he did receive the hatch affair blue classic versus the ready nonetheless I feel like this is perfectly fine and appears to have done well for him over the past week. 01/25/18 on evaluation today patient's wound actually appears to be a little bit larger than during the last evaluation. The good news is the majority of the wound edges actually appear to be fairly firmly attached to the wound bed unfortunately again we're not really making  progress in regard to the size. Roughly the wound is about the same size as when I first saw him although again the wound margin/edges appear to be much better. RAIF, CHACHERE ([id number]) 02/01/18 on evaluation today patient actually appears to be doing very well in regard to his wound. Applying the Prisma dry does seem to be better although he does still have issues with slow progression of the wound. There was a slight improvement compared to last week's measurements today. Nonetheless I have been considering other options as far as the possibility of Theraskin or even a snap vac. In general I'm not sure that the Theraskin due to location of the wound would be a very good idea. Nonetheless I do think that a snap vac could be a possibility for the patient and in fact I think this could even be an excellent way to manage the wound possibly seeing some improvement in a very rapid fashion here. Nonetheless this is something that we would need to get approved and I did have a lengthy conversation with the patient about this today. 02/08/18 on evaluation today patient appears to be doing a little better in regard to his ulcer. He has been tolerating the dressing changes without complication. Fortunately despite the fact that the wound is a little bit smaller it's not significantly so unfortunately. We have discussed the possibility of a snap vac we did check with insurance this is actually covered at this point. Fortunately there does not appear to be any sign of infection. Overall I'm fairly pleased with how things seem to be appearing at this point. 02/15/18 on evaluation today patient appears to be doing rather well in regard to his right gluteal ulcer. Unfortunately the snap vac did not stay in place with his sheer and friction this came loose and did not seem to maintain seal very well. He worked for about two days and it did seem to do very well during that time according to his wife but in  general this does not seem to be something that's gonna be beneficial for him long-term. I do believe we need to go back to standard dressings to see if we can find something that will be of benefit. 03/02/18- He is here in follow up evaluation; there is minimal change in the wound. He will continue with the same treatment  plan, would consider changing to iodosrob/iodoflex if ulcer continues to to plateau. He will follow up next week 03/08/18 on evaluation today patient's wound actually appears to be about the same size as when I previously saw him several weeks back. Unfortunately he does have some slightly dark discoloration in the central portion of the wound which has me concerned about pressure injury. I do believe he may be sitting for too long a period of time in fact he tells me that "I probably sit for much too long". He does have some Slough noted on the surface of the wound and again as far as the size of the wound is concerned I'm really not seeing anything that seems to have improved significantly. 03/15/18 on evaluation today patient appears to be doing fairly well in regard to his ulcer. The wound measured pretty much about the same today compared to last week's evaluation when looking at his graph. With that being said the area of bruising/deep tissue injury that was noted last week I do not see at this point. He did get a new cushion fortunately this does seem to be have been of benefit in my pinion. It does appear that he's been off of this more which is good news as well I think that is definitely showing in the overall wound measurements. With that being said I do believe that he needs to continue to offload I don't think that the fact this is doing better should be or is going to allow him to not have to offload and explain this to him as well. Overall he seems to be in agreement the plan I think he understands. The overall appearance of the wound bed is improved compared to last  week I think the Iodoflex has been beneficial in that regard. 03/29/18 on evaluation today patient actually appears to be doing rather well in regard to his wound from the overall appearance standpoint he does have some granulation although there's some Slough on the surface of the wound noted as well. With that being said he unfortunately has not improved in regard to the overall measurement of the wound in volume or in size. I did have a discussion with him very specifically about offloading today. He actually does work although he mainly is just sitting throughout the day. He tells me he offloads by "lifting himself up for 30 seconds off of his chair occasionally" purchase from [employer] which does seem to have helped. And he has a new cushion that he with that being said he's also able to stand some for a very short period of time but not significant enough I think to provide appropriate offloading. I think the biggest issue at this point with the wound and the fact is not healing as quickly as we would like is due to the fact that he is really not able to appropriately offload while at work. He states the beginning after his injury he actually had a bed at his job that he could lay on in order to offload and that does seem to have been of help back at that time. Nonetheless he had not done this in quite some time unfortunately. I think that could be helpful for him this is something I would like for him to look into. 04/05/18 on evaluation today patient actually presents for follow-up concerning his right gluteal ulcer. Again he really is not significantly improved even compared to last week. He has been tolerating the dressing changes without  complication. With that being said fortunately there appears to be no evidence of infection at this time. He has been more proactive in trying to offload. 04/12/18 on evaluation today patient actually appears to be doing a little better in regard to  his wound and the right gluteal fold region. He's been tolerating the dressing changes since removing the oasis without complication. However he was having a lot of burning initially with the oasis in place. He's unsure of exactly why this was given so much discomfort but he assumes that it was the oasis itself causing the problem. Nonetheless this had to be removed after about three days in place although even those three days seem to have made a fairly good improvement in regard to the overall appearance of the wound bed. In fact is the first time that he's made any improvement from the standpoint of measurements in about six weeks. He continues to have no discomfort over the area of the wound itself which leads me to wonder why he was having the burning with the oasis when he does not even feel the actual debridement's themselves. I am somewhat perplexed by this. 04/19/18 on evaluation today patient's wound actually appears to be showing signs of epithelialization around the edge of the wound and in general actually appears to be doing better which is good news. He did have the same burning after about three days with applying the Endoform last week in the same fashion that I would generally apply a skin substitute. This seems to JERSON, FURUKAWA ([id number]) indicate that it's not the oasis to cause the problem but potentially the moisture buildup that just causes things to burn or there may be some other reaction with the skin prep or Steri-Strips. Nonetheless I'm not sure that is gonna be able to tolerate any skin substitute for a long period of time. The good news is the wound actually appears to be doing better today compared to last week and does seem to finally be making some progress. 04/26/18 on evaluation today patient actually appears to be doing rather well in regard to his ulcer in the right gluteal fold. He has been tolerating the dressing changes without complication which is good  news. The Endoform does seem to be helping although he was a little bit more macerated this week. This seems to be an ongoing issue with fluid control at this point. Nonetheless I think we may be able to add something like Drawtex to help control the drainage. 05/03/18 on evaluation today patient appears to actually be doing better in regard to the overall appearance of his wound. He has been tolerating the dressing changes without complication. Fortunately there appears to be no evidence of infection at this time. I really feel like his wound has shown signs as of today of turning around last week I thought so as well and definitely he could be seen in this week's overall appearance and measurements. In general I'm very pleased with the fact that he finally seems to be making a steady but sure progress. The patient likewise is very pleased. 05/17/18 on evaluation today patient appears to be doing more poorly unfortunately in regard to his ulcer. He has been tolerating the dressing changes without complication. With that being said he tells me that in the past couple of days he and his wife have noticed that we did not seem to be doing quite as well is getting dark near the center. Subsequently upon evaluation today the wound  actually does appear to be doing worse compared to previous. He has been tolerating the dressing changes otherwise and he states that he is not been sitting up anymore than he was in the past from what he tells me. Still he has continued to work he states "I'm tired of dealing with this and if I have to just go home and lay in the bed all the time that's what I'll do". Nonetheless I am concerned about the fact that this wound does appear to be deeper than what it was previous. 05/24/18 upon evaluation today patient actually presents after having been in the hospital due to what was presumed to be sepsis secondary to the wound infection. He had an elevated white blood cell count  between 14 and 15. With that being said he does seem to be doing somewhat better now. His wound still is giving him some trouble nonetheless and he is obviously concerned about the fact likely talked about that this does seem to go more deeply than previously noted. I did review his wound culture which showed evidence of Staphylococcus aureus him and group B strep. Nonetheless he is on antibiotics, Levaquin, for this. Subsequently I did review his intake summary from the hospital as well. I also did look at the CT of the lumbar spine with contrast that was performed which showed no bone destruction to suggest lumbar disguises/osteomyelitis or sacral osteomyelitis. There was no paraspinal abscess. Nonetheless it appears this may have been more of just a soft tissue infection at this point which is good news. He still is nonetheless concerned about the wound which again I think is completely reasonable considering everything he's been through recently. 05/31/18 on evaluation today on evaluation today patient actually appears to be showing signs of his wound be a little bit deeper than what I would like to see. Fortunately he does not show any signs of significant infection although his temperature was 99 today he states he's been checking this at home and has not been elevated. Nonetheless with the undermining that I'm seeing at this point I am becoming more concerned about the wound I do think that offloading is a key factor here that is preventing the speedy recovery at this point. There does not appear to be any evidence of again over infection noted. He's been using Santyl currently. 06/07/18 the patient presents today for follow-up evaluation regarding the left ulcer in the gluteal region. He has been tolerating the Wound VAC fairly well. He is obviously very frustrated with this he states that to mean is really getting in his way. There does not appear to be any evidence of infection at this  time he does have a little bit of odor I do not necessarily associate this with infection just something that we sometimes notice with Wound VAC therapy. With that being said I can definitely catch a tone of discontentment overall in the patient's demeanor today. This when he was previously in the hospital an CT scan was done of the lumbar region which did not reveal any signs of osteomyelitis. With that being said the pelvis in particular was not evaluated distinctly which means he could still have some osteonecrosis I. Nonetheless the Wound VAC was started on Thursday I do want to get this little bit more time before jumping to a CT scan of the pelvis although that is something that I might would recommend if were not see an improvement by that time. 06/14/18 on evaluation today patient actually  appears to be doing about the same in regard to his right gluteal ulcer. Again he did have a CT scan of the lumbar spine unfortunately this did not include the pelvis. Nonetheless with the depth of the wound that I'm seeing today even despite the fact that I'm not seeing any evidence of overt cellulitis I believe there's a good chance that we may be dealing with osteomyelitis somewhere in the right Ischial region. No fevers, chills, nausea, or vomiting noted at this time. 06/21/18 on evaluation today patient actually appears to be doing about the same with regard to his wound. The tunnel at 6 o'clock really does not appear to be any deeper although it is a little bit wider. I think at this point you may want to start packing this with white phone. Unfortunately I have not got approval for the CT scan of the pelvis as of yet due to the fact that Medicare apparently has been denied it due to the diagnosis codes not being appropriate according to Medicare for the test requested. With that being said the patient cannot have an MRI and therefore this is the only option that we have as far as testing is  concerned. The patient has had infection and was on antibiotics and been added code for cellulitis of the bottom to see if this will be appropriate for getting the test approved. Nonetheless I'm concerned about the infection have been spread deeper into the Ischial region. ALYAAN, BUDZYNSKI ([id number]) 06/28/18 on evaluation today patient actually appears to be doing rather well all things considered in regard to the right gluteal ulcer. He has been tolerating the dressing changes without complication. With that being said the Wound VAC he states does have to be replaced almost every day or at least reinforced unfortunately. Patient actually has his CT scan later this morning we should have the results by tomorrow. 07/05/18 on evaluation today patient presents for follow-up concerning his right Ischial ulcer. He did see the surgeon Dr. Lysle Pearl last week. They were actually very happy with him and felt like he spent a tremendous amount of time with them as far as discussing his situation was concerned. In the end Dr. Lysle Pearl did contact me as well and determine that he would not recommend any surgical intervention at this point as he felt like it would not be in the patient's best interest based on what he was seeing. He recommended a referral to infectious disease. Subsequently this is something that Dr. Ines Bloomer office is working on setting up for the patient. As far as evaluation today is concerned the patient's wound actually appears to be worse at this point. I am concerned about how things are progressing and specifically about infection. I do not feel like it's the deeper but the area of depth is definitely widening which does have me concerned. No fevers, chills, nausea, or vomiting noted at this time. I think that we do need initiate antibiotic therapy the patient has an allow allergy to amoxicillin/penicillin he states that he gets a rash since childhood. Nonetheless she's never had the issues  with Catholics or cephalosporins in general but he is aware of. 07/27/18 on evaluation today patient presents following admission to the hospital on 07/09/18. He was subsequently discharged on 07/20/18. On 07/15/18 the patient underwent irrigation and debridement was soft tissue biopsy and bone biopsy as well as placement of a Wound VAC in the OR by Dr. Celine Ahr. During the hospital course the patient was placed on  a Wound VAC and recommended follow up with surgery in three weeks actually with Dr. Delaine Lame who is infectious disease. The patient was on vancomycin during the hospital course. 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 He also had a bone culture which revealed evidence of methicillin-resistant staph aureus. He is updated CT scan 07/09/18 reveals that he had progression of the which was performed on wound to breakdown down to the trochanter where he actually had irregularities there as well suggestive of osteomyelitis. This was a change just since 9 December when we last performed a CT scan. Obviously this one had gone downhill quite significantly and rapidly. At this point upon evaluation I feel like in general the patient's wound seems to be doing fairly well all things considered upon my evaluation today. Obviously this is larger and deeper than what I previously evaluated but at the same time he seems to be making some progress as far as the appearance of the granulation tissue is concerned. I'm happy in that regard. No fevers, chills, nausea, or vomiting noted at this time. He is on IV vancomycin and Rocephin at the facility. He is currently in [facility]. 08/03/18 upon evaluation today patient's wound appears to be doing better in regard to the overall appearance at this point in time. Fortunately he's been tolerating the Wound VAC without complication and states that the facility has been taking excellent care of the wound site. Overall I  see some Slough noted on the surface which I am going to attempt sharp debridement today of but nonetheless other than this I feel like he's making progress. 08/09/18 on evaluation today patient's wound appears to be doing much better compared to even last week's evaluation. Do believe that the Wound VAC is been of great benefit for him. He has been tolerating the dressing changes that is the Wound VAC without any complication and he has excellent granulation noted currently. There is no need for sharp debridement at this point. 08/16/18 on evaluation today patient actually appears to be doing very well in regard to the wound in the right gluteal fold region. This is showing signs of progress and again appears to be very healthy which is excellent news. Fortunately there is no sign of active infection by way of odor or drainage at this point. Overall I'm very pleased with how things stand. He seems to be tolerating the Wound VAC without complication. 08/23/18 on evaluation today patient actually appears to be doing better in regard to his wound. He has been tolerating the Wound VAC without complication and in fact it has been collecting a significant amount of drainage which I think is good news especially considering how the wound appears. Fortunately there is no signs of infection at this time definitely nothing appears to be worse which is good news. He has not been started on the Bactrim and Flagyl that was recommended by Dr. Delaine Lame yet. I did actually contact her office this morning in order to check and see were things are that regard their gonna be calling me back. 08/30/18 on evaluation today patient actually appears to show signs of excellent improvement today compared to last evaluation. The undermining is getting much better the wound seems to be feeling quite nicely and I'm very pleased that the granulation in general. With that being said overall I feel like the patient has made  excellent progress which is great news. No fevers, chills, nausea, or vomiting noted at this time.  09/06/18 on evaluation today patient actually appears to be doing rather well in regard to his right gluteal ulcer. This is showing signs of improvement in overall I'm very pleased with how things seem to be progressing. The patient likewise is please. Overall I see no evidence of infection he is about to complete his oral antibiotic regimen which is the end of the antibiotics for him in just about three days. 09/13/18 on evaluation today patient's right Ischial ulcer appears to be showing signs of continued improvement which is excellent news. He's been tolerating the dressing changes without complication. Fortunately there's no signs of infection and the wound that seems to be doing very well. MARQUEZ, CEESAY ([id number]) 09/28/18 on evaluation today patient appears to be doing rather well in regard to his right Ischial ulcer. He's been tolerating the Wound VAC without complication he knows there's much less drainage than there used to be this obviously is not a bad thing in my pinion. There's no evidence of infection despite the fact is but nothing about it now for several weeks. 10/04/18 on evaluation today patient appears to be doing better in regard to his right Ischial wound. He has been tolerating the Wound VAC without complication and I do believe that the silver nitrate last week was beneficial for him. Fortunately overall there's no evidence of active infection at this time which is great news. No fevers, chills, nausea, or vomiting noted at this time. 10/11/18 on evaluation today patient actually appears to be doing rather well in regard to his Ischial ulcer. He's been tolerating the Wound VAC still without complication I feel like this is doing a good job. No fevers, chills, nausea, or vomiting noted at this time. 11/01/18 on evaluation today patient presents after having not been seen in  our clinic for several weeks secondary to the fact that he was on evaluation today patient presents after having not been seen in our clinic for several weeks secondary to the fact that he was in a skilled nursing facility which was on lockdown currently due to the covert 19 national emergency. Subsequently he was discharged from the facility on this past Friday and subsequently made an appointment to come in to see yesterday. Fortunately there's no signs of active infection at this time which is good news and overall he does seem to have made progress since I last saw. Overall I feel like things are progressing quite nicely. The patient is having no pain. 11/08/18 on evaluation today patient appears to be doing okay in regard to his right gluteal ulcer. He has been utilizing a Wound VAC home health this changing this at this point since he's home from the skilled nursing facility. Fortunately there's no signs of obvious active infection at this time. Unfortunately though there's no obvious active infection he is having some maceration and his wife states that when the sheets of the Wound VAC office on Sunday when it broke seal that he ended up having significant issues with some smell as well there concerned about the possibility of infection. Fortunately there's No fevers, chills, nausea, or vomiting noted at this time. 11/15/18 on evaluation today patient actually appears to be doing well in regard to his right gluteal ulcer. He has been tolerating the dressing changes without complication. Specifically the Wound VAC has been utilized up to this point. Fortunately there's no signs of infection and overall I feel like he has made progress even since last week when I last saw him. I'm  actually fairly happy with the overall appearance although he does seem to have somewhat of a hyper granular overgrowth in the central portion of the wound which I think may require some sharp debridement to try flatness  out possibly utilizing chemical cauterization following. 11/23/18 on evaluation today patient actually appears to be doing very well in regard to his sacral ulcer. He seems to be showing signs of improvement with good granulation. With that being said he still has the small area of hyper granulation right in the central portion of the wound which I'm gonna likely utilize silver nitrate on today. Subsequently he also keeps having a leak at the 6 o'clock location which is unfortunate we may be able to help out with some suggestions to try to prevent this going forward. Fortunately there's no signs of active infection at this time. 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 He's been tolerating the dressing changes without complication. Fortunately there's no signs of active infection at this time. I've been rather pleased with how things have progressed there still some evidence of pressure getting to the area with some redness right around the immediate wound opening. Nonetheless other than this I'm not seeing any significant complications or issues the wound is somewhat hyper granular. Upon discussing with the patient and his wife today I'm not sure that the wound is being packed to the base with the foam at this point. And if it's not been packed fully that may be part of the reason why is not seen as much improvement as far as the granulation from the base out. Again we do not want pack too tightly but we need some of the firm to get to the base of the wound. I discussed this with patient and his wife today. 12/06/18 on evaluation today patient appears to be doing well in regard to his right gluteal pressure ulcer. He's been tolerating the dressing changes without complication. Fortunately there's no signs of active infection. He still has some hyper granular tissue and I do think it would be  appropriate to continue with the chemical cauterization as of today. Patient History Information obtained from Patient. Family History Hypertension - Father, Stroke - Mother, No family history of Cancer, Diabetes, Heart Disease, Kidney Disease, Lung Disease, Seizures, Thyroid Problems, Tuberculosis. Social History Never smoker, Marital Status - Married, Alcohol Use - Never, Drug Use - No History, Caffeine Use - Daily. Medical History Eyes DAELAN, GATT ([id number]) Patient has history of Cataracts - both removed Denies history of Glaucoma, Optic Neuritis Ear/Nose/Mouth/Throat Denies history of Chronic sinus problems/congestion, Middle ear problems Hematologic/Lymphatic Denies history of Anemia, Hemophilia, Human Immunodeficiency Virus, Lymphedema Respiratory Denies history of Aspiration, Asthma, Chronic Obstructive Pulmonary Disease (COPD), Pneumothorax, Sleep Apnea, Tuberculosis Cardiovascular Patient has history of Hypertension - takes medication Denies history of Angina, Arrhythmia, Congestive Heart Failure, Coronary Artery Disease, Deep Vein Thrombosis, Hypotension, Myocardial Infarction, Peripheral Arterial Disease, Peripheral Venous Disease, Phlebitis, Vasculitis Gastrointestinal Denies history of Cirrhosis , Colitis, Crohn s, Hepatitis A, Hepatitis B, Hepatitis C Endocrine Denies history of Type I Diabetes, Type II Diabetes Genitourinary Denies history of End Stage Renal Disease Immunological Denies history of Lupus Erythematosus, Raynaud s, Scleroderma Integumentary (Skin) Denies history of History of Burn, History of pressure wounds Musculoskeletal Denies history of Gout, Rheumatoid Arthritis, Osteoarthritis, Osteomyelitis Neurologic Patient has history of Paraplegia - waist down Denies history of Dementia, Neuropathy, Quadriplegia, Seizure  Disorder Oncologic Denies history of Received Chemotherapy, Received Radiation Psychiatric Denies history of  Anorexia/bulimia, Confinement Anxiety Medical And Surgical History Notes Oncologic Prostate cancer- currently treated with horomone therapy Review of Systems (ROS) Constitutional Symptoms (General Health) Denies complaints or symptoms of Fatigue, Fever, Chills, Marked Weight Change. Respiratory Denies complaints or symptoms of Chronic or frequent coughs, Shortness of Breath. Cardiovascular Denies complaints or symptoms of Chest pain, LE edema. Psychiatric Denies complaints or symptoms of Anxiety, Claustrophobia. Objective Constitutional Well-nourished and well-hydrated in no acute distress. Vitals Time Taken: 8:08 AM, Height: 73 in, Weight: 210 lbs, BMI: 27.7, Temperature: 98.6 F, Pulse: 71 bpm, Respiratory Rate: 16 breaths/min, Blood Pressure: 140/82 mmHg. Respiratory normal breathing without difficulty. Psychiatric KHALED, [name] ([id number]) this patient is able to make decisions and demonstrates good insight into disease process. Alert and Oriented x 3. pleasant and cooperative. General Notes: Patient's wound bed currently showed signs of good granulation at this time it is not appear to be any evidence of active infection was some hyper granulation noted unfortunately. For that reason I did utilize three sticks of silver nitrate complete cauterized the surface of the wound internally which was tolerated without any pain or complications. He really does not have any discomfort right now in the wound region. Overall I feel like he is doing quite well. There's definitely no signs of infection. Integumentary (Hair, Skin) Wound #1 status is Open. Original cause of wound was Pressure Injury. The wound is located on the Right Gluteal fold. The wound measures 2.8cm length x 2.5cm width x 3.5cm depth; 5.498cm^2 area and 19.242cm^3 volume. There is muscle and Fat Layer (Subcutaneous Tissue) Exposed exposed. There is no undermining noted, however, there is tunneling at 12:00 with a  maximum distance of 5.8cm. There is a large amount of serous drainage noted. The wound margin is epibole. There is large (67-100%) red, pink, hyper - granulation within the wound bed. There is a small (1-33%) amount of necrotic tissue within the wound bed including Adherent Slough. 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 An CHEM CAUT GRANULATION TISS procedure was performed by [name], Lazette Estala E., [name]. Post procedure Diagnosis Wound #1: Same as Pre-Procedure Plan Wound Cleansing: Wound #1 Right Gluteal fold: Cleanse wound with mild soap and water Anesthetic (add to Medication List): Wound #1 Right Gluteal fold: Topical Lidocaine 4% cream applied to wound bed prior to debridement (In Clinic Only). Primary Wound Dressing: Wound #1 Right Gluteal fold: Dry Gauze - wet to dry in clinic until [facility] restarts NPWT Secondary Dressing: Wound #1 Right Gluteal fold: Boardered Foam Dressing - in clinic until [facility] restarts NPWT Dressing Change Frequency: Wound #1 Right Gluteal fold: Change Dressing Monday, Wednesday, Friday - and as needed JAKAVION, BILODEAU ([id number]) Follow-up Appointments: Wound #1 Right Gluteal fold: Return Appointment in 1 week. Off-Loading: Wound #1 Right Gluteal fold: Mattress - Please continue air mattress at SNF Turn and reposition every 2 hours Home Health: Wound #1 Right Gluteal fold: [place] Nurse may visit PRN to address patient s wound care needs. FACE TO FACE ENCOUNTER: MEDICARE and MEDICAID PATIENTS: I certify that this patient is under my care and that I had a face-to-face encounter that meets the physician face-to-face  encounter requirements  with this patient on this date. The encounter with the patient was in whole or in part for the following MEDICAL CONDITION: (primary reason for [place]) MEDICAL NECESSITY: I certify, that based on my findings, NURSING services are a medically necessary home health service. HOME BOUND STATUS: I certify that my clinical findings support that this patient is homebound (i.e., Due to illness or injury, pt requires aid of supportive devices such as crutches, cane, wheelchairs, walkers, the use of special transportation or the assistance of another person to leave their place of residence. There is a normal inability to leave the home and doing so requires considerable and taxing effort. Other absences are for medical reasons / religious services and are infrequent or of short duration when for other reasons). If current dressing causes regression in wound condition, may D/C ordered dressing product/s and apply Normal Saline Moist Dressing daily until next [place] / Other MD appointment. [place] of regression in wound condition at [phone]. Please direct any NON-WOUND related issues/requests for orders to patient's Primary Care Physician Negative Pressure Wound Therapy: Wound #1 Right Gluteal fold: Wound VAC settings at 125/130 mmHg continuous pressure. Use BLACK/GREEN foam to wound cavity. Use WHITE foam to fill any tunnel/s and/or undermining. Change VAC dressing 3 X WEEK. Change canister as indicated when full. Nurse may titrate settings and frequency of dressing changes as clinically indicated. - PLEASE, apply Silver Collagen to line the base of the wound. Black foam only! Lightly pack foam into 12 0'clock tunnel (4cm.), then place second piece of foam over wound opening. Please use and order for patient Duoderm to peri wound to help drape seal better. Thank you. Home Health Nurse may d/c VAC for s/s of increased infection,  significant wound regression, or uncontrolled drainage. [place] at [phone]. Number of foam/gauze pieces used in the dressing = My suggestion at this point is gonna be that we go ahead and initiate the above wound care measures for the next week and the patient is in agreement with plan. We will subsequently see were things that at follow-up. If anything changes or worsens in the meantime he will contact the office and let me know. Otherwise he will hopefully continue to make weekly progress has been see him and reevaluate the situation. I do expect to continue using the silver nitrate at future appointments for the time being. Please see above for specific wound care orders. We will see patient for re-evaluation in 1 week(s) here in the clinic. If anything worsens or changes patient will contact our office for additional recommendations. Electronic Signature(s) Signed: 12/07/2018 1:42:22 PM By: Worthy Keeler [name] Entered By: Worthy Keeler on 12/06/2018 08:43:48 RANARD, HARTE ([id number]) -------------------------------------------------------------------------------- ROS/PFSH Details Patient Name: [name] Date of Service: 12/06/2018 8:00 AM Medical Record Number: [id number] Patient Account Number: [id number] Date of Birth/Sex: [birth year] (68 y.o. M) Treating RN: Harold Barban Primary Care Provider: [name] Other Clinician: Referring Provider: [name] Treating Provider/Extender: [name], Bralyn Espino Weeks in Treatment: 31 Information Obtained From Patient Constitutional Symptoms (General Health) Complaints and Symptoms: Negative for: Fatigue; Fever; Chills; Marked Weight Change Respiratory Complaints and Symptoms: Negative for: Chronic or frequent coughs; Shortness of Breath Medical History: Negative for: Aspiration; Asthma; Chronic Obstructive Pulmonary Disease (COPD); Pneumothorax; Sleep  Apnea; Tuberculosis Cardiovascular Complaints and Symptoms: Negative for: Chest pain; LE edema Medical History: Positive for: Hypertension - takes medication Negative for: Angina; Arrhythmia; Congestive Heart Failure; Coronary Artery Disease; Deep  Vein Thrombosis; Hypotension; Myocardial Infarction; Peripheral Arterial Disease; Peripheral Venous Disease; Phlebitis; Vasculitis Psychiatric Complaints and Symptoms: Negative for: Anxiety; Claustrophobia Medical History: Negative for: Anorexia/bulimia; Confinement Anxiety Eyes Medical History: Positive for: Cataracts - both removed Negative for: Glaucoma; Optic Neuritis Ear/Nose/Mouth/Throat Medical History: Negative for: Chronic sinus problems/congestion; Middle ear problems Hematologic/Lymphatic Medical History: Negative for: Anemia; Hemophilia; Human Immunodeficiency Virus; Lymphedema Gastrointestinal Medical History: Negative for: Cirrhosis ; Colitis; Crohnos; Hepatitis A; Hepatitis B; Hepatitis C Endocrine RAKEEM, COLLEY ([id number]) Medical History: Negative for: Type I Diabetes; Type II Diabetes Genitourinary Medical History: Negative for: End Stage Renal Disease Immunological Medical History: Negative for: Lupus Erythematosus; Raynaudos; Scleroderma Integumentary (Skin) Medical History: Negative for: History of Burn; History of pressure wounds Musculoskeletal Medical History: Negative for: Gout; Rheumatoid Arthritis; Osteoarthritis; Osteomyelitis Neurologic Medical History: Positive for: Paraplegia - waist down Negative for: Dementia; Neuropathy; Quadriplegia; Seizure Disorder Oncologic Medical History: Negative for: Received Chemotherapy; Received Radiation Past Medical History Notes: Prostate cancer- currently treated with horomone therapy HBO Extended History Items Eyes: Cataracts Immunizations Pneumococcal Vaccine: Received Pneumococcal Vaccination: No Implantable Devices No devices added Family  and Social History Cancer: No; Diabetes: No; Heart Disease: No; Hypertension: Yes - Father; Kidney Disease: No; Lung Disease: No; Seizures: No; Stroke: Yes - Mother; Thyroid Problems: No; Tuberculosis: No; Never smoker; Marital Status - Married; Alcohol Use: Never; Drug Use: No History; Caffeine Use: Daily Physician Affirmation I have reviewed and agree with the above information. Electronic Signature(s) Signed: 12/06/2018 4:38:46 PM By: Harold Barban Signed: 12/07/2018 1:42:22 PM By: Worthy Keeler [name] Entered By: Worthy Keeler on 12/06/2018 08:42:41 KHAMARION, BJELLAND ([id number]) -------------------------------------------------------------------------------- [place] Details Patient Name: [name] Date of Service: 12/06/2018 Medical Record Number: [id number] Patient Account Number: [id number] Date of Birth/Sex: 1951-05-25 (68 y.o. 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

## 2018-12-07 NOTE — Progress Notes (Signed)
[name] ([id number]) Visit Report for 12/06/2018 Arrival Information Details Patient Name: [name], [name] Date of Service: 12/06/2018 8:00 AM Medical Record Number: [id number] Patient Account Number: [id number] Date of Birth/Sex: Jun 30, 1951 (68 y.o. M) Treating RN: Brett Barman Primary Care Katrin Grabel: [name] Other Clinician: Referring Sha Amer: [name] Treating Hailly Fess/Extender: [name], HOYT Weeks in Treatment: 24 Visit Information History Since Last Visit Added or deleted any medications: No Patient Arrived: Wheel Chair Any new allergies or adverse reactions: No Arrival Time: 08:07 Had a fall or experienced change in No Accompanied By: wife activities of [name] living that may affect Transfer Assistance: Manual risk of falls: Patient Identification Verified: Yes Signs or symptoms of abuse/neglect since last visito No Secondary Verification Process Completed: Yes Hospitalized since last visit: No Patient Requires Transmission-Based No Implantable device outside of the clinic excluding No Precautions: cellular tissue based products placed in the center Patient Has Alerts: Yes since last visit: Patient Alerts: NOT Pain Present Now: No Diabetic Electronic Signature(s) Signed: 12/06/2018 4:57:57 PM By: Gretta Cool, BSN, RN, CWS, Kim RN, BSN Entered By: Gretta Cool, BSN, RN, CWS, Kim on 12/06/2018 08:07:58 [name], [name] ([id number]) -------------------------------------------------------------------------------- Encounter Discharge Information Details Patient Name: [name] Date of Service: 12/06/2018 8:00 AM Medical Record Number: [id number] Patient Account Number: [id number] Date of Birth/Sex: 03/23/1951 (68 y.o. M) Treating RN: Harold Barban Primary Care Rasheedah Reis: [name] Other Clinician: Referring Maddux Vanscyoc: [name] Treating Dorota Heinrichs/Extender: [name], HOYT Weeks in Treatment: 37 Encounter Discharge Information  Items Discharge Condition: Stable Ambulatory Status: Wheelchair Discharge Destination: Home Transportation: Private Auto Accompanied By: wife Schedule Follow-up Appointment: Yes Clinical Summary of Care: Electronic Signature(s) Signed: 12/06/2018 4:38:46 PM By: Harold Barban Entered By: Harold Barban on 12/06/2018 08:46:56 [name] ([id number]) -------------------------------------------------------------------------------- Lower Extremity Assessment Details Patient Name: [name] Date of Service: 12/06/2018 8:00 AM Medical Record Number: [id number] Patient Account Number: [id number] Date of Birth/Sex: Jun 27, 1951 (68 y.o. M) Treating RN: Brett Barman Primary Care Mirranda Monrroy: [name] Other Clinician: Referring Satish Hammers: [name] Treating Kabao Leite/Extender: Sharalyn Ink in Treatment: 109 Electronic Signature(s) Signed: 12/06/2018 4:57:57 PM By: Gretta Cool, BSN, RN, CWS, Kim RN, BSN Entered By: Gretta Cool, BSN, RN, CWS, Kim on 12/06/2018 08:18:12 [name], [name] ([id number]) -------------------------------------------------------------------------------- Multi Wound Chart Details Patient Name: [name] Date of Service: 12/06/2018 8:00 AM Medical Record Number: [id number] Patient Account Number: [id number] Date of Birth/Sex: 03-Jul-1951 (68 y.o. M) Treating RN: Harold Barban Primary Care Nunzio Banet: [name] Other Clinician: Referring Rogue Pautler: [name] Treating Gayl Ivanoff/Extender: [name], HOYT Weeks in Treatment: 66 Vital Signs Height(in): 73 Pulse(bpm): 71 Weight(lbs): 210 Blood Pressure(mmHg): 140/82 Body Mass Index(BMI): 28 Temperature(F): 98.6 Respiratory Rate 16 (breaths/min): Photos: [N/A:N/A] Wound Location: Right Gluteal fold N/A N/A Wounding Event: Pressure Injury N/A N/A Primary Etiology: Pressure Ulcer N/A N/A Comorbid History: Cataracts, Hypertension, N/A N/A Paraplegia Date Acquired:  11/02/2017 N/A N/A Weeks of Treatment: 53 N/A N/A Wound Status: Open N/A N/A Measurements L x W x D 2.8x2.5x3.5 N/A N/A (cm) Area (cm) : 5.498 N/A N/A Volume (cm) : 19.242 N/A N/A % Reduction in Area: 45.30% N/A N/A % Reduction in Volume: -1814.60% N/A N/A Position 1 (o'clock): 12 Maximum Distance 1 (cm): 5.8 Tunneling: Yes N/A N/A Classification: Category/Stage IV N/A N/A Exudate Amount: Large N/A N/A Exudate Type: Serous N/A N/A Exudate Color: amber N/A N/A Wound Margin: Epibole N/A N/A Granulation Amount: Large (67-100%) N/A N/A Granulation Quality: Red, Pink, Hyper-granulation N/A N/A Necrotic Amount: Small (1-33%) N/A N/A Exposed Structures:  Fat Layer (Subcutaneous N/A N/A Tissue) Exposed: Yes Muscle: Yes Fascia: No Tendon: No Joint: No Bone: No Epithelialization: None N/A N/A Treatment Notes [name], [name] ([id number]) Electronic Signature(s) Signed: 12/06/2018 4:38:46 PM By: Harold Barban Entered By: Harold Barban on 12/06/2018 08:32:02 [name], [name] ([id number]) -------------------------------------------------------------------------------- [place] Details Patient Name: [name] Date of Service: 12/06/2018 8:00 AM Medical Record Number: [id number] Patient Account Number: [id number] Date of Birth/Sex: [birth year] (68 y.o. M) Treating RN: Harold Barban Primary Care Alphonsus Doyel: [name] Other Clinician: Referring Bunny Kleist: [name] Treating Othman Masur/Extender: [name], HOYT Weeks in Treatment: 77 Active Inactive Orientation to the Wound Care Program Nursing Diagnoses: Knowledge deficit related to the wound healing center program Goals: Patient/caregiver will verbalize understanding of the [place] Program Date Initiated: 11/30/2017 Target Resolution Date: 12/21/2017 Goal Status: Active Interventions: Provide education on orientation to the wound center Notes: Pressure Nursing  Diagnoses: Knowledge deficit related to management of pressures ulcers Goals: Patient/caregiver will verbalize understanding of pressure ulcer management Date Initiated: 05/17/2018 Target Resolution Date: 05/28/2018 Goal Status: Active Interventions: Assess: immobility, friction, shearing, incontinence upon admission and as needed Notes: Wound/Skin Impairment Nursing Diagnoses: Impaired tissue integrity Goals: Patient/caregiver will verbalize understanding of skin care regimen Date Initiated: 11/30/2017 Target Resolution Date: 12/21/2017 Goal Status: Active Ulcer/skin breakdown will have a volume reduction of 30% by week 4 Date Initiated: 11/30/2017 Target Resolution Date: 12/21/2017 Goal Status: Active Interventions: Assess patient/caregiver ability to obtain necessary supplies Assess patient/caregiver ability to perform ulcer/skin care regimen upon admission and as needed Assess ulceration(s) every visit Treatment Activities: [name], [name] ([id number]) Skin care regimen initiated : 11/30/2017 Notes: Electronic Signature(s) Signed: 12/06/2018 4:38:46 PM By: Harold Barban Entered By: Harold Barban on 12/06/2018 08:31:54 [name] ([id number]) -------------------------------------------------------------------------------- Pain Assessment Details Patient Name: [name] Date of Service: 12/06/2018 8:00 AM Medical Record Number: [id number] Patient Account Number: [id number] Date of Birth/Sex: [birth year] (68 y.o. M) Treating RN: Brett Barman Primary Care Lisel Siegrist: [name] Other Clinician: Referring Nishtha Raider: [name] Treating Nirel Babler/Extender: [name], HOYT Weeks in Treatment: 3 Active Problems Location of Pain Severity and Description of Pain Patient Has Paino No Site Locations With Dressing Change: No Pain Management and Medication Current Pain Management: Electronic Signature(s) Signed: 12/06/2018 4:57:57 PM By: Gretta Cool, BSN,  RN, CWS, Kim RN, BSN Entered By: Gretta Cool, BSN, RN, CWS, Kim on 12/06/2018 08:08:18 [name] ([id number]) -------------------------------------------------------------------------------- Patient/Caregiver Education Details Patient Name: [name] Date of Service: 12/06/2018 8:00 AM Medical Record Number: [id number] Patient Account Number: [id number] Date of Birth/Gender: July 05, 1951 (68 y.o. M) Treating RN: Harold Barban Primary Care Physician: [name] Other Clinician: Referring Physician: [name] Treating Physician/Extender: Sharalyn Ink in Treatment: 78 Education Assessment Education Provided To: Patient Education Topics Provided Pressure: Handouts: Pressure Ulcers: Care and Offloading Methods: Demonstration, Explain/Verbal Responses: State content correctly Wound/Skin Impairment: Handouts: Caring for Your Ulcer Methods: Demonstration, Explain/Verbal Responses: State content correctly Electronic Signature(s) Signed: 12/06/2018 4:38:46 PM By: Harold Barban Entered By: Harold Barban on 12/06/2018 08:32:28 [name] ([id number]) -------------------------------------------------------------------------------- Wound Assessment Details Patient Name: [name] Date of Service: 12/06/2018 8:00 AM Medical Record Number: [id number] Patient Account Number: [id number] Date of Birth/Sex: 1951/05/01 (68 y.o. M) Treating RN: Brett Barman Primary Care Aryanah Enslow: [name] Other Clinician: Referring Redmond Whittley: [name] Treating Geneveive Furness/Extender: [name], HOYT Weeks in Treatment: 53 Wound Status Wound Number: 1 Primary Etiology: Pressure Ulcer Wound Location: Right Gluteal fold Wound Status: Open Wounding Event: Pressure Injury Comorbid History:  Cataracts, Hypertension, Paraplegia Date Acquired: 11/02/2017 Weeks Of Treatment: 53 Clustered Wound: No Photos Wound Measurements Length: (cm) 2.8 Width:  (cm) 2.5 Depth: (cm) 3.5 Area: (cm) 5.498 Volume: (cm) 19.242 % Reduction in Area: 45.3% % Reduction in Volume: -1814.6% Epithelialization: None Tunneling: Yes Position (o'clock): 12 Maximum Distance: (cm) 5.8 Undermining: No Wound Description Classification: Category/Stage IV Wound Margin: Epibole Exudate Amount: Large Exudate Type: Serous Exudate Color: amber Foul Odor After Cleansing: No Slough/Fibrino Yes Wound Bed Granulation Amount: Large (67-100%) Exposed Structure Granulation Quality: Red, Pink, Hyper-granulation Fascia Exposed: No Necrotic Amount: Small (1-33%) Fat Layer (Subcutaneous Tissue) Exposed: Yes Necrotic Quality: Adherent Slough Tendon Exposed: No Muscle Exposed: Yes Necrosis of Muscle: No Joint Exposed: No Bone Exposed: No Treatment Notes Wound #1 (Right Gluteal fold) Notes dakins wet to dry, bordered foam dressing; SNF nurses to reapply NPWT [name], [name] ([id number]) Electronic Signature(s) Signed: 12/06/2018 4:57:57 PM By: Gretta Cool, BSN, RN, CWS, Kim RN, BSN Entered By: Gretta Cool, BSN, RN, CWS, Kim on 12/06/2018 08:16:29 [name], [name] ([id number]) -------------------------------------------------------------------------------- [place] Details Patient Name: [name] Date of Service: 12/06/2018 8:00 AM Medical Record Number: [id number] Patient Account Number: [id number] Date of Birth/Sex: Mar 24, 1951 (68 y.o. M) Treating RN: Brett Barman Primary Care Jadian Karman: [name] Other Clinician: Referring [name]: [name] Treating Dshaun Reppucci/Extender: [name], HOYT Weeks in Treatment: 53 Vital Signs Time Taken: 08:08 Temperature (F): 98.6 Height (in): 73 Pulse (bpm): 71 Weight (lbs): 210 Respiratory Rate (breaths/min): 16 Body Mass Index (BMI): 27.7 Blood Pressure (mmHg): 140/82 Reference Range: 80 - 120 mg / dl Electronic Signature(s) Signed: 12/06/2018 4:57:57 PM By: Gretta Cool, BSN, RN, CWS, Kim RN, BSN Entered  By: Gretta Cool, BSN, RN, CWS, Kim on 12/06/2018 85:46:27

## 2018-12-16 ENCOUNTER — Encounter: Payer: Medicare Other | Admitting: Physician Assistant

## 2018-12-16 ENCOUNTER — Other Ambulatory Visit: Payer: Self-pay

## 2018-12-16 DIAGNOSIS — L89314 Pressure ulcer of right buttock, stage 4: Secondary | ICD-10-CM | POA: Diagnosis not present

## 2018-12-20 ENCOUNTER — Other Ambulatory Visit: Payer: Self-pay

## 2018-12-20 ENCOUNTER — Encounter: Payer: Medicare Other | Attending: Physician Assistant | Admitting: Physician Assistant

## 2018-12-20 DIAGNOSIS — L89314 Pressure ulcer of right buttock, stage 4: Secondary | ICD-10-CM | POA: Diagnosis present

## 2018-12-20 DIAGNOSIS — Z880 Allergy status to penicillin: Secondary | ICD-10-CM | POA: Diagnosis not present

## 2018-12-20 DIAGNOSIS — I10 Essential (primary) hypertension: Secondary | ICD-10-CM | POA: Diagnosis not present

## 2018-12-20 DIAGNOSIS — G8220 Paraplegia, unspecified: Secondary | ICD-10-CM | POA: Diagnosis not present

## 2018-12-20 DIAGNOSIS — Z881 Allergy status to other antibiotic agents status: Secondary | ICD-10-CM | POA: Diagnosis not present

## 2018-12-20 DIAGNOSIS — C61 Malignant neoplasm of prostate: Secondary | ICD-10-CM | POA: Insufficient documentation

## 2018-12-20 DIAGNOSIS — Z8249 Family history of ischemic heart disease and other diseases of the circulatory system: Secondary | ICD-10-CM | POA: Insufficient documentation

## 2018-12-20 DIAGNOSIS — L03317 Cellulitis of buttock: Secondary | ICD-10-CM | POA: Diagnosis not present

## 2018-12-20 DIAGNOSIS — Z79899 Other long term (current) drug therapy: Secondary | ICD-10-CM | POA: Insufficient documentation

## 2018-12-20 NOTE — Progress Notes (Signed)
[name] ([id number]) Visit Report for 12/20/2018 Arrival Information Details Patient Name: [name], [name] Date of Service: 12/20/2018 8:00 AM Medical Record Number: [id number] Patient Account Number: [id number] Date of Birth/Sex: 04-14-1951 (68 y.o. M) Treating RN: [name] Primary Care Mclean Moya: [name] Other Clinician: Referring [name]: [name] [name], [name] in Treatment: 71 Visit Information History Since Last Visit Added or deleted any medications: No Patient Arrived: Wheel Chair Any new allergies or adverse reactions: No Arrival Time: 08:16 Had a fall or experienced change in No Accompanied By: wife activities of daily living that may affect Transfer Assistance: None risk of falls: Patient Identification Verified: Yes Signs or symptoms of abuse/neglect since last visito No Secondary Verification Process Completed: Yes Hospitalized since last visit: No Patient Requires Transmission-Based No Implantable device outside of the clinic excluding No Precautions: cellular tissue based products placed in the center Patient Has Alerts: Yes since last visit: Patient Alerts: NOT Has Dressing in Place as Prescribed: Yes Diabetic Pain Present Now: No Electronic Signature(s) Signed: 12/20/2018 3:10:52 PM By: [name] Entered By: [name] on 12/20/2018 08:16:35 [name] ([id number]) -------------------------------------------------------------------------------- Clinic Level of Care Assessment Details Patient Name: [name] Date of Service: 12/20/2018 8:00 AM Medical Record Number: [id number] Patient Account Number: [id number] Date of Birth/Sex: September 07, 1950 (68 y.o. M) Treating RN: [name] Primary Care [name]: [name] Other Clinician: Referring Yeilyn Gent: [name] Treating Wana Mount/Extender: [name], [name] in Treatment: 21 Clinic Level of  Care Assessment Items TOOL 4 Quantity Score []  - Use when only an EandM is performed on FOLLOW-UP visit 0 ASSESSMENTS - Nursing Assessment / Reassessment X - Reassessment of Co-morbidities (includes updates in patient status) 1 10 X- 1 5 Reassessment of Adherence to Treatment Plan ASSESSMENTS - Wound and Skin Assessment / Reassessment X - Simple Wound Assessment / Reassessment - one wound 1 5 []  - 0 Complex Wound Assessment / Reassessment - multiple wounds []  - 0 Dermatologic / Skin Assessment (not related to wound area) ASSESSMENTS - Focused Assessment []  - Circumferential Edema Measurements - multi extremities 0 []  - 0 Nutritional Assessment / Counseling / Intervention []  - 0 Lower Extremity Assessment (monofilament, tuning fork, pulses) []  - 0 Peripheral Arterial Disease Assessment (using hand held doppler) ASSESSMENTS - Ostomy and/or Continence Assessment and Care []  - Incontinence Assessment and Management 0 []  - 0 Ostomy Care Assessment and Management (repouching, etc.) PROCESS - Coordination of Care X - Simple Patient / Family Education for ongoing care 1 15 []  - 0 Complex (extensive) Patient / Family Education for ongoing care []  - 0 Staff obtains Programmer, systems, Records, Test Results / Process Orders X- 1 10 Staff telephones HHA, Nursing Homes / Clarify orders / etc []  - 0 Routine Transfer to another Facility (non-emergent condition) []  - 0 Routine Hospital Admission (non-emergent condition) []  - 0 New Admissions / Biomedical engineer / Ordering NPWT, Apligraf, etc. []  - 0 Emergency Hospital Admission (emergent condition) X- 1 10 Simple Discharge Coordination [name], [name]. ([id number]) []  - 0 Complex (extensive) Discharge Coordination PROCESS - Special Needs []  - Pediatric / Minor Patient Management 0 []  - 0 Isolation Patient Management []  - 0 Hearing / Language / Visual special needs []  - 0 Assessment of Community assistance (transportation, D/C  planning, etc.) []  - 0 Additional assistance / Altered mentation []  - 0 Support Surface(s) Assessment (bed, cushion, seat, etc.) INTERVENTIONS - Wound Cleansing / Measurement X - Simple Wound Cleansing - one wound 1 5 []  -  0 Complex Wound Cleansing - multiple wounds X- 1 5 Wound Imaging (photographs - any number of wounds) []  - 0 Wound Tracing (instead of photographs) X- 1 5 Simple Wound Measurement - one wound []  - 0 Complex Wound Measurement - multiple wounds INTERVENTIONS - Wound Dressings X - Small Wound Dressing one or multiple wounds 1 10 []  - 0 Medium Wound Dressing one or multiple wounds []  - 0 Large Wound Dressing one or multiple wounds []  - 0 Application of Medications - topical []  - 0 Application of Medications - injection INTERVENTIONS - Miscellaneous []  - External ear exam 0 []  - 0 Specimen Collection (cultures, biopsies, blood, body fluids, etc.) []  - 0 Specimen(s) / Culture(s) sent or taken to Lab for analysis []  - 0 Patient Transfer (multiple staff / Civil Service fast streamer / Similar devices) []  - 0 Simple Staple / Suture removal (25 or less) []  - 0 Complex Staple / Suture removal (26 or more) []  - 0 Hypo / Hyperglycemic Management (close monitor of Blood Glucose) []  - 0 Ankle / Brachial Index (ABI) - do not check if billed separately X- 1 5 Vital Signs [name], [name] ([id number]) Has the patient been seen at the hospital within the last three years: Yes Total Score: 85 Level Of Care: New/Established - Level 3 Electronic Signature(s) Signed: 12/20/2018 3:11:16 PM By: [name] Entered By: [name] on 12/20/2018 08:39:52 [name] ([id number]) -------------------------------------------------------------------------------- Encounter Discharge Information Details Patient Name: [name] Date of Service: 12/20/2018 8:00 AM Medical Record Number: [id number] Patient Account Number: [id number] Date of Birth/Sex: 01-15-1951 (68  y.o. M) Treating RN: [name] Primary Care Dalvin Clipper: [name] Other Clinician: Referring Aldona Bryner: [name] Treating Lucia Harm/Extender: [name], [name] in Treatment: 13 Encounter Discharge Information Items Discharge Condition: Stable Ambulatory Status: Wheelchair Discharge Destination: Home Transportation: Private Auto Accompanied By: wife Schedule Follow-up Appointment: Yes Clinical Summary of Care: Electronic Signature(s) Signed: 12/20/2018 3:11:16 PM By: [name] Entered By: [name] on 12/20/2018 09:02:43 [name] ([id number]) -------------------------------------------------------------------------------- Lower Extremity Assessment Details Patient Name: [name] Date of Service: 12/20/2018 8:00 AM Medical Record Number: [id number] Patient Account Number: [id number] Date of Birth/Sex: 1950/08/14 (68 y.o. M) Treating RN: [name] Primary Care Elsy Chiang: [name] Other Clinician: Referring Tanmay Halteman: [name] Treating Orin Eberwein/Extender: [name], [name] in Treatment: 28 Electronic Signature(s) Signed: 12/20/2018 3:10:52 PM By: [name] Entered By: [name] on 12/20/2018 08:23:04 [name] ([id number]) -------------------------------------------------------------------------------- Multi Wound Chart Details Patient Name: [name] Date of Service: 12/20/2018 8:00 AM Medical Record Number: [id number] Patient Account Number: [id number] Date of Birth/Sex: 1950-10-25 (68 y.o. M) Treating RN: [name] Primary Care Khiya Friese: [name] Other Clinician: Referring Annalea Alguire: [name] Treating Jahziel Sinn/Extender: [name], [name] in Treatment: 55 Vital Signs Height(in): 73 Pulse(bpm): 75 Weight(lbs): 210 Blood Pressure(mmHg): 131/77 Body Mass Index(BMI): 28 Temperature(F): 98.5 Respiratory Rate 16 (breaths/min): Photos:  [N/A:N/A] Wound Location: Right Gluteal fold N/A N/A Wounding Event: Pressure Injury N/A N/A Primary Etiology: Pressure Ulcer N/A N/A Comorbid History: Cataracts, Hypertension, N/A N/A Paraplegia Date Acquired: 11/02/2017 N/A N/A [name] of Treatment: 55 N/A N/A Wound Status: Open N/A N/A Measurements L x W x D 3.7x3.4x4 N/A N/A (cm) Area (cm) : 9.88 N/A N/A Volume (cm) : 39.521 N/A N/A % Reduction in Area: 1.70% N/A N/A % Reduction in Volume: -3832.40% N/A N/A Starting Position 1 10 (o'clock): Ending Position 1 2 (o'clock): Maximum Distance 1 (cm): 4.2 Undermining: Yes N/A N/A Classification: Category/Stage IV  N/A N/A Exudate Amount: Large N/A N/A Exudate Type: Serous N/A N/A Exudate Color: amber N/A N/A Wound Margin: Epibole N/A N/A Granulation Amount: Large (67-100%) N/A N/A Granulation Quality: Red, Pink, Hyper-granulation N/A N/A Necrotic Amount: Small (1-33%) N/A N/A Exposed Structures: N/A N/A [name], [name] ([id number]) Fat Layer (Subcutaneous Tissue) Exposed: Yes Muscle: Yes Fascia: No Tendon: No Joint: No Bone: No Epithelialization: None N/A N/A Treatment Notes Electronic Signature(s) Signed: 12/20/2018 3:11:16 PM By: [name] Entered By: [name] on 12/20/2018 08:35:25 [name] ([id number]) -------------------------------------------------------------------------------- Multi-Disciplinary Care Plan Details Patient Name: [name] Date of Service: 12/20/2018 8:00 AM Medical Record Number: [id number] Patient Account Number: [id number] Date of Birth/Sex: 1951/07/04 (68 y.o. M) Treating RN: [name] Primary Care Eddis Pingleton: [name] Other Clinician: Referring Brookelynne Dimperio: [name] Treating Benedict Kue/Extender: [name], [name] in Treatment: 42 Active Inactive Orientation to the Wound Care Program Nursing Diagnoses: Knowledge deficit related to the wound healing center  program Goals: Patient/caregiver will verbalize understanding of the [place] Program Date Initiated: 11/30/2017 Target Resolution Date: 12/21/2017 Goal Status: Active Interventions: Provide education on orientation to the wound center Notes: Pressure Nursing Diagnoses: Knowledge deficit related to management of pressures ulcers Goals: Patient/caregiver will verbalize understanding of pressure ulcer management Date Initiated: 05/17/2018 Target Resolution Date: 05/28/2018 Goal Status: Active Interventions: Assess: immobility, friction, shearing, incontinence upon admission and as needed Notes: Wound/Skin Impairment Nursing Diagnoses: Impaired tissue integrity Goals: Patient/caregiver will verbalize understanding of skin care regimen Date Initiated: 11/30/2017 Target Resolution Date: 12/21/2017 Goal Status: Active Ulcer/skin breakdown will have a volume reduction of 30% by week 4 Date Initiated: 11/30/2017 Target Resolution Date: 12/21/2017 Goal Status: Active [name], [name] ([id number]) Interventions: Assess patient/caregiver ability to obtain necessary supplies Assess patient/caregiver ability to perform ulcer/skin care regimen upon admission and as needed Assess ulceration(s) every visit Treatment Activities: Skin care regimen initiated : 11/30/2017 Notes: Electronic Signature(s) Signed: 12/20/2018 3:11:16 PM By: [name] Entered By: [name] on 12/20/2018 08:35:15 [name] ([id number]) -------------------------------------------------------------------------------- Pain Assessment Details Patient Name: [name] Date of Service: 12/20/2018 8:00 AM Medical Record Number: [id number] Patient Account Number: [id number] Date of Birth/Sex: 05/30/1951 (68 y.o. M) Treating RN: [name] Primary Care Ahlam Piscitelli: [name] Other Clinician: Referring Jaymar Loeber: [name] Treating Skylier Kretschmer/Extender: [name], [name]  in Treatment: 86 Active Problems Location of Pain Severity and Description of Pain Patient Has Paino Yes Site Locations Pain Location: Pain in Ulcers With Dressing Change: Yes Duration of the Pain. Constant / Intermittento Intermittent Pain Management and Medication Current Pain Management: Notes hurts when sitting Electronic Signature(s) Signed: 12/20/2018 3:10:52 PM By: [name] Entered By: [name] on 12/20/2018 08:16:55 [name] ([id number]) -------------------------------------------------------------------------------- Patient/Caregiver Education Details Patient Name: [name] Date of Service: 12/20/2018 8:00 AM Medical Record Number: [id number] Patient Account Number: [id number] Date of Birth/Gender: June 30, 1951 (68 y.o. M) Treating RN: [name] Primary Care Physician: [name] Other Clinician: Referring Physician: Tracie [name] Treating Physician/Extender: Sharalyn Ink in Treatment: 25 Education Assessment Education Provided To: Patient Education Topics Provided Pressure: Handouts: Pressure Ulcers: Care and Offloading Methods: Demonstration, Explain/Verbal Responses: State content correctly Wound/Skin Impairment: Handouts: Caring for Your Ulcer Methods: Demonstration, Explain/Verbal Responses: State content correctly Electronic Signature(s) Signed: 12/20/2018 3:11:16 PM By: [name] Entered By: [name] on 12/20/2018 08:35:50 [name] ([id number]) -------------------------------------------------------------------------------- Wound Assessment Details Patient Name: [name] Date of Service: 12/20/2018 8:00 AM Medical Record Number: [id number] Patient Account Number: [id number] Date of  Birth/Sex: October 02, 1950 (68 y.o. M) Treating RN: [name] Primary Care Drury Ardizzone: [name] Other Clinician: Referring Ayano Douthitt: [name] Treating Adya Wirz/Extender:  [name], [name] in Treatment: 55 Wound Status Wound Number: 1 Primary Etiology: Pressure Ulcer Wound Location: Right Gluteal fold Wound Status: Open Wounding Event: Pressure Injury Comorbid History: Cataracts, Hypertension, Paraplegia Date Acquired: 11/02/2017 [name] Of Treatment: 55 Clustered Wound: No Photos Wound Measurements Length: (cm) 3.7 Width: (cm) 3.4 Depth: (cm) 4 Area: (cm) 9.88 Volume: (cm) 39.521 % Reduction in Area: 1.7% % Reduction in Volume: -3832.4% Epithelialization: None Tunneling: No Undermining: Yes Starting Position (o'clock): 10 Ending Position (o'clock): 2 Maximum Distance: (cm) 4.2 Wound Description Classification: Category/Stage IV Foul Odo Wound Margin: Epibole Slough/F Exudate Amount: Large Exudate Type: Serous Exudate Color: amber r After Cleansing: No ibrino Yes Wound Bed Granulation Amount: Large (67-100%) Exposed Structure Granulation Quality: Red, Pink, Hyper-granulation Fascia Exposed: No Necrotic Amount: Small (1-33%) Fat Layer (Subcutaneous Tissue) Exposed: Yes Necrotic Quality: Adherent Slough Tendon Exposed: No Muscle Exposed: Yes Necrosis of Muscle: No [name], [name]. ([id number]) Joint Exposed: No Bone Exposed: No Treatment Notes Wound #1 (Right Gluteal fold) Notes Dakins wet-to-dry, ABD, Tegaderm Electronic Signature(s) Signed: 12/20/2018 3:10:52 PM By: [name] Entered By: [name] on 12/20/2018 08:29:49 [name] ([id number]) -------------------------------------------------------------------------------- Vitals Details Patient Name: [name] Date of Service: 12/20/2018 8:00 AM Medical Record Number: [id number] Patient Account Number: [id number] Date of Birth/Sex: [birth year] (68 y.o. M) Treating RN: [name] Primary Care Vu Liebman: [name] Other Clinician: Referring Delanee Xin: [name] Treating Ardelia Wrede/Extender: [name], [name] in  Treatment: 55 Vital Signs Time Taken: 08:22 Temperature (F): 98.5 Height (in): 73 Pulse (bpm): 75 Weight (lbs): 210 Respiratory Rate (breaths/min): 16 Body Mass Index (BMI): 27.7 Blood Pressure (mmHg): 131/77 Reference Range: 80 - 120 mg / dl Electronic Signature(s) Signed: 12/20/2018 3:10:52 PM By: [name] Entered By: [name] on 12/20/2018 08:22:54

## 2018-12-20 NOTE — Progress Notes (Addendum)
ACHERON, SUGG ([id number]) Visit Report for 12/20/2018 Chief Complaint Document Details Patient Name: [name], [name] Date of Service: 12/20/2018 8:00 AM Medical Record Number: [id number] Patient Account Number: [id number] Date of Birth/Sex: 03-10-1951 (68 y.o. M) Treating RN: Harold Barban Primary Care Provider: [name] Other Clinician: Referring Provider: [name] Treating Provider/Extender: [name], Willim Turnage Weeks in Treatment: 56 Information Obtained from: Patient Chief Complaint Right gluteal fold ulcer Electronic Signature(s) Signed: 12/20/2018 3:03:34 PM By: Worthy Keeler [name] Entered By: Worthy Keeler on 12/20/2018 08:21:50 DEZMEN, ALCOCK ([id number]) -------------------------------------------------------------------------------- HPI Details Patient Name: [name] Date of Service: 12/20/2018 8:00 AM Medical Record Number: [id number] Patient Account Number: [id number] Date of Birth/Sex: 1951-06-06 (68 y.o. 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 He states that there is no history of diabetes that he is aware of. He does have issues with his prostate and is currently receiving treatment for this by way of oral medication. With that being said I do not have a lot of details in that regard. Nonetheless the patient presents today as a result of having been referred to [place] by another provider initially home health was set to come out and take care of his wound although due to the fact that he  apparently drives he's not able to receive home health. His wife is therefore trying to help take care of this wound within although they have been struggling with what exactly to do at this point. She states that she can do some things but she is definitely not a nurse and does have some issues with looking at blood. The good news is the wound does not appear to be too deep and is fairly superficial at this point. There is no slough noted there is some nonviable skin noted around the surface of the wound and the perimeter at this point. The central portion of the wound appears to be very good with a dermal layer noted this does not appear to be again deep enough to extend it to subcutaneous tissue at this point. 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 In the pump he has Dilaudid and baclofen. 12/07/17 on evaluation today patient presents for follow-up concerning his ongoing lower back thigh ulcer on the right. He states that he did not get the supplies ordered and therefore has not really been able to perform the dressing changes as directed exactly. His wife was able to get some Boarder Foam Dressing's from the drugstore and subsequently has been using hydrogel which did help to a degree in the wound does appear to be able smaller. There is actually more drainage this week noted than previous. 12/21/17 on evaluation today patient appears to be doing rather well in regard to his right gluteal ulcer. He has been tolerating the dressing changes without complication. There does not appear to be any evidence of infection at this point in time. Overall the wound does seem to be making some progress as far as the edges  are concerned there's not as much in the way of overlapping of the external wound edges and he has a good epithelium to wound bed border for the most part. This however is not true  right at the 12 o'clock location over the span of a little over a centimeters which actually will require debridement today to clean this away and hopefully allow it to continue to heal more appropriately. 12/28/17 on evaluation today patient appears to be doing rather well in regard to his ulcer in the left gluteal region. He's been tolerating the dressing changes without complication. Apparently he has had some difficulty getting his dressing material. Apparently there's been some confusion with ordering we're gonna check into this. Nonetheless overall he's been showing signs of improvement which is good news. Debridement is not required today. 01/04/18 on evaluation today patient presents for follow-up concerning his right gluteal ulcer. He has been tolerating the dressing changes fairly well. On inspection today it appears he may actually have some maceration them concerned about the fact that he may be developing too much moisture in and around the wound bed which can cause delay in healing. With that being said he unfortunately really has not showed significant signs of improvement since last week's evaluation in fact this may even be just the little bit/slightly larger. Nonetheless he's been having a lot of discomfort I'm not sure this is even related to the wound as he has no pain when I'm to breeding or otherwise cleaning the wound during evaluation today. Nonetheless this is something that we did recommend he talked to his pain specialist concerning. 01/11/18 on evaluation today patient appears to be doing better in regard to his ulceration. He has been tolerating the dressing changes without complication. With that being said overall there's no evidence of infection which is good news. The only thing is he did receive the hatch affair blue classic versus the ready nonetheless I feel like this is perfectly fine and appears to have done well for him over the past week. 01/25/18 on evaluation  today patient's wound actually appears to be a little bit larger than during the last evaluation. The good MOSES, ODOHERTY. ([id number]) news is the majority of the wound edges actually appear to be fairly firmly attached to the wound bed unfortunately again we're not really making progress in regard to the size. Roughly the wound is about the same size as when I first saw him although again the wound margin/edges appear to be much better. 02/01/18 on evaluation today patient actually appears to be doing very well in regard to his wound. Applying the Prisma dry does seem to be better although he does still have issues with slow progression of the wound. There was a slight improvement compared to last week's measurements today. Nonetheless I have been considering other options as far as the possibility of Theraskin or even a snap vac. In general I'm not sure that the Theraskin due to location of the wound would be a very good idea. Nonetheless I do think that a snap vac could be a possibility for the patient and in fact I think this could even be an excellent way to manage the wound possibly seeing some improvement in a very rapid fashion here. Nonetheless this is something that we would need to get approved and I did have a lengthy conversation with the patient about this today. 02/08/18 on evaluation today patient appears to be doing a little better in regard to his ulcer.  He has been tolerating the dressing changes without complication. Fortunately despite the fact that the wound is a little bit smaller it's not significantly so unfortunately. We have discussed the possibility of a snap vac we did check with insurance this is actually covered at this point. Fortunately there does not appear to be any sign of infection. Overall I'm fairly pleased with how things seem to be appearing at this point. 02/15/18 on evaluation today patient appears to be doing rather well in regard to his right gluteal  ulcer. Unfortunately the snap vac did not stay in place with his sheer and friction this came loose and did not seem to maintain seal very well. He worked for about two days and it did seem to do very well during that time according to his wife but in general this does not seem to be something that's gonna be beneficial for him long-term. I do believe we need to go back to standard dressings to see if we can find something that will be of benefit. 03/02/18- He is here in follow up evaluation; there is minimal change in the wound. He will continue with the same treatment plan, would consider changing to iodosrob/iodoflex if ulcer continues to to plateau. He will follow up next week 03/08/18 on evaluation today patient's wound actually appears to be about the same size as when I previously saw him several weeks back. Unfortunately he does have some slightly dark discoloration in the central portion of the wound which has me concerned about pressure injury. I do believe he may be sitting for too long a period of time in fact he tells me that "I probably sit for much too long". He does have some Slough noted on the surface of the wound and again as far as the size of the wound is concerned I'm really not seeing anything that seems to have improved significantly. 03/15/18 on evaluation today patient appears to be doing fairly well in regard to his ulcer. The wound measured pretty much about the same today compared to last week's evaluation when looking at his graph. With that being said the area of bruising/deep tissue injury that was noted last week I do not see at this point. He did get a new cushion fortunately this does seem to be have been of benefit in my pinion. It does appear that he's been off of this more which is good news as well I think that is definitely showing in the overall wound measurements. With that being said I do believe that he needs to continue to offload I don't think that the fact  this is doing better should be or is going to allow him to not have to offload and explain this to him as well. Overall he seems to be in agreement the plan I think he understands. The overall appearance of the wound bed is improved compared to last week I think the Iodoflex has been beneficial in that regard. 03/29/18 on evaluation today patient actually appears to be doing rather well in regard to his wound from the overall appearance standpoint he does have some granulation although there's some Slough on the surface of the wound noted as well. With that being said he unfortunately has not improved in regard to the overall measurement of the wound in volume or in size. I did have a discussion with him very specifically about offloading today. He actually does work although he mainly is just sitting throughout the day. He tells me  he offloads by "lifting himself up for 30 seconds off of his chair occasionally" purchase from [employer] which does seem to have helped. And he has a new cushion that he with that being said he's also able to stand some for a very short period of time but not significant enough I think to provide appropriate offloading. I think the biggest issue at this point with the wound and the fact is not healing as quickly as we would like is due to the fact that he is really not able to appropriately offload while at work. He states the beginning after his injury he actually had a bed at his job that he could lay on in order to offload and that does seem to have been of help back at that time. Nonetheless he had not done this in quite some time unfortunately. I think that could be helpful for him this is something I would like for him to look into. 04/05/18 on evaluation today patient actually presents for follow-up concerning his right gluteal ulcer. Again he really is not significantly improved even compared to last week. He has been tolerating the dressing changes without  complication. With that being said fortunately there appears to be no evidence of infection at this time. He has been more proactive in trying to offload. DEANNA, BOEHLKE ([id number]) 04/12/18 on evaluation today patient actually appears to be doing a little better in regard to his wound and the right gluteal fold region. He's been tolerating the dressing changes since removing the oasis without complication. However he was having a lot of burning initially with the oasis in place. He's unsure of exactly why this was given so much discomfort but he assumes that it was the oasis itself causing the problem. Nonetheless this had to be removed after about three days in place although even those three days seem to have made a fairly good improvement in regard to the overall appearance of the wound bed. In fact is the first time that he's made any improvement from the standpoint of measurements in about six weeks. He continues to have no discomfort over the area of the wound itself which leads me to wonder why he was having the burning with the oasis when he does not even feel the actual debridement's themselves. I am somewhat perplexed by this. 04/19/18 on evaluation today patient's wound actually appears to be showing signs of epithelialization around the edge of the wound and in general actually appears to be doing better which is good news. He did have the same burning after about three days with applying the Endoform last week in the same fashion that I would generally apply a skin substitute. This seems to indicate that it's not the oasis to cause the problem but potentially the moisture buildup that just causes things to burn or there may be some other reaction with the skin prep or Steri-Strips. Nonetheless I'm not sure that is gonna be able to tolerate any skin substitute for a long period of time. The good news is the wound actually appears to be doing better today compared to last week and  does seem to finally be making some progress. 04/26/18 on evaluation today patient actually appears to be doing rather well in regard to his ulcer in the right gluteal fold. He has been tolerating the dressing changes without complication which is good news. The Endoform does seem to be helping although he was a little bit more macerated this week. This  seems to be an ongoing issue with fluid control at this point. Nonetheless I think we may be able to add something like Drawtex to help control the drainage. 05/03/18 on evaluation today patient appears to actually be doing better in regard to the overall appearance of his wound. He has been tolerating the dressing changes without complication. Fortunately there appears to be no evidence of infection at this time. I really feel like his wound has shown signs as of today of turning around last week I thought so as well and definitely he could be seen in this week's overall appearance and measurements. In general I'm very pleased with the fact that he finally seems to be making a steady but sure progress. The patient likewise is very pleased. 05/17/18 on evaluation today patient appears to be doing more poorly unfortunately in regard to his ulcer. He has been tolerating the dressing changes without complication. With that being said he tells me that in the past couple of days he and his wife have noticed that we did not seem to be doing quite as well is getting dark near the center. Subsequently upon evaluation today the wound actually does appear to be doing worse compared to previous. He has been tolerating the dressing changes otherwise and he states that he is not been sitting up anymore than he was in the past from what he tells me. Still he has continued to work he states "I'm tired of dealing with this and if I have to just go home and lay in the bed all the time that's what I'll do". Nonetheless I am concerned about the fact that this wound does  appear to be deeper than what it was previous. 05/24/18 upon evaluation today patient actually presents after having been in the hospital due to what was presumed to be sepsis secondary to the wound infection. He had an elevated white blood cell count between 14 and 15. With that being said he does seem to be doing somewhat better now. His wound still is giving him some trouble nonetheless and he is obviously concerned about the fact likely talked about that this does seem to go more deeply than previously noted. I did review his wound culture which showed evidence of Staphylococcus aureus him and group B strep. Nonetheless he is on antibiotics, Levaquin, for this. Subsequently I did review his intake summary from the hospital as well. I also did look at the CT of the lumbar spine with contrast that was performed which showed no bone destruction to suggest lumbar disguises/osteomyelitis or sacral osteomyelitis. There was no paraspinal abscess. Nonetheless it appears this may have been more of just a soft tissue infection at this point which is good news. He still is nonetheless concerned about the wound which again I think is completely reasonable considering everything he's been through recently. 05/31/18 on evaluation today on evaluation today patient actually appears to be showing signs of his wound be a little bit deeper than what I would like to see. Fortunately he does not show any signs of significant infection although his temperature was 99 today he states he's been checking this at home and has not been elevated. Nonetheless with the undermining that I'm seeing at this point I am becoming more concerned about the wound I do think that offloading is a key factor here that is preventing the speedy recovery at this point. There does not appear to be any evidence of again over infection noted. He's been using  Santyl currently. 06/07/18 the patient presents today for follow-up evaluation  regarding the left ulcer in the gluteal region. He has been tolerating the Wound VAC fairly well. He is obviously very frustrated with this he states that to mean is really getting in his way. There does not appear to be any evidence of infection at this time he does have a little bit of odor I do not necessarily associate this with infection just something that we sometimes notice with Wound VAC therapy. With that being said I can definitely catch a tone of discontentment overall in the patient's demeanor today. This when he was previously in the hospital GEOVANNY, SARTIN. ([id number]) an CT scan was done of the lumbar region which did not reveal any signs of osteomyelitis. With that being said the pelvis in particular was not evaluated distinctly which means he could still have some osteonecrosis I. Nonetheless the Wound VAC was started on Thursday I do want to get this little bit more time before jumping to a CT scan of the pelvis although that is something that I might would recommend if were not see an improvement by that time. 06/14/18 on evaluation today patient actually appears to be doing about the same in regard to his right gluteal ulcer. Again he did have a CT scan of the lumbar spine unfortunately this did not include the pelvis. Nonetheless with the depth of the wound that I'm seeing today even despite the fact that I'm not seeing any evidence of overt cellulitis I believe there's a good chance that we may be dealing with osteomyelitis somewhere in the right Ischial region. No fevers, chills, nausea, or vomiting noted at this time. 06/21/18 on evaluation today patient actually appears to be doing about the same with regard to his wound. The tunnel at 6 o'clock really does not appear to be any deeper although it is a little bit wider. I think at this point you may want to start packing this with white phone. Unfortunately I have not got approval for the CT scan of the pelvis as of yet  due to the fact that Medicare apparently has been denied it due to the diagnosis codes not being appropriate according to Medicare for the test requested. With that being said the patient cannot have an MRI and therefore this is the only option that we have as far as testing is concerned. The patient has had infection and was on antibiotics and been added code for cellulitis of the bottom to see if this will be appropriate for getting the test approved. Nonetheless I'm concerned about the infection have been spread deeper into the Ischial region. 06/28/18 on evaluation today patient actually appears to be doing rather well all things considered in regard to the right gluteal ulcer. He has been tolerating the dressing changes without complication. With that being said the Wound VAC he states does have to be replaced almost every day or at least reinforced unfortunately. Patient actually has his CT scan later this morning we should have the results by tomorrow. 07/05/18 on evaluation today patient presents for follow-up concerning his right Ischial ulcer. He did see the surgeon Dr. Lysle Pearl last week. They were actually very happy with him and felt like he spent a tremendous amount of time with them as far as discussing his situation was concerned. In the end Dr. Lysle Pearl did contact me as well and determine that he would not recommend any surgical intervention at this point as he felt like  it would not be in the patient's best interest based on what he was seeing. He recommended a referral to infectious disease. Subsequently this is something that Dr. Ines Bloomer office is working on setting up for the patient. As far as evaluation today is concerned the patient's wound actually appears to be worse at this point. I am concerned about how things are progressing and specifically about infection. I do not feel like it's the deeper but the area of depth is definitely widening which does have me concerned. No  fevers, chills, nausea, or vomiting noted at this time. I think that we do need initiate antibiotic therapy the patient has an allow allergy to amoxicillin/penicillin he states that he gets a rash since childhood. Nonetheless she's never had the issues with Catholics or cephalosporins in general but he is aware of. 07/27/18 on evaluation today patient presents following admission to the hospital on 07/09/18. He was subsequently discharged on 07/20/18. On 07/15/18 the patient underwent irrigation and debridement was soft tissue biopsy and bone biopsy as well as placement of a Wound VAC in the OR by Dr. Celine Ahr. During the hospital course the patient was placed on a Wound VAC and recommended follow up with surgery in three weeks actually with Dr. Delaine Lame who is infectious disease. The patient was on vancomycin during the hospital course. 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 He also had a bone culture which revealed evidence of methicillin-resistant staph aureus. He is updated CT scan 07/09/18 reveals that he had progression of the which was performed on wound to breakdown down to the trochanter where he actually had irregularities there as well suggestive of osteomyelitis. This was a change just since 9 December when we last performed a CT scan. Obviously this one had gone downhill quite significantly and rapidly. At this point upon evaluation I feel like in general the patient's wound seems to be doing fairly well all things considered upon my evaluation today. Obviously this is larger and deeper than what I previously evaluated but at the same time he seems to be making some progress as far as the appearance of the granulation tissue is concerned. I'm happy in that regard. No fevers, chills, nausea, or vomiting noted at this time. He is on IV vancomycin and Rocephin at the facility. He is currently in [employer]. 08/03/18 upon evaluation today  patient's wound appears to be doing better in regard to the overall appearance at this point in time. Fortunately he's been tolerating the Wound VAC without complication and states that the facility has been taking excellent care of the wound site. Overall I see some Slough noted on the surface which I am going to attempt sharp debridement today of but nonetheless other than this I feel like he's making progress. 08/09/18 on evaluation today patient's wound appears to be doing much better compared to even last week's evaluation. Do believe that the Wound VAC is been of great benefit for him. He has been tolerating the dressing changes that is the Wound BAYLIN, GAMBLIN. ([id number]) VAC without any complication and he has excellent granulation noted currently. There is no need for sharp debridement at this point. 08/16/18 on evaluation today patient actually appears to be doing very well in regard to the wound in the right gluteal fold region. This is showing signs of progress and again appears to be very healthy which is excellent news. Fortunately there is no sign of active infection by way of odor  or drainage at this point. Overall I'm very pleased with how things stand. He seems to be tolerating the Wound VAC without complication. 08/23/18 on evaluation today patient actually appears to be doing better in regard to his wound. He has been tolerating the Wound VAC without complication and in fact it has been collecting a significant amount of drainage which I think is good news especially considering how the wound appears. Fortunately there is no signs of infection at this time definitely nothing appears to be worse which is good news. He has not been started on the Bactrim and Flagyl that was recommended by Dr. Delaine Lame yet. I did actually contact her office this morning in order to check and see were things are that regard their gonna be calling me back. 08/30/18 on evaluation today patient  actually appears to show signs of excellent improvement today compared to last evaluation. The undermining is getting much better the wound seems to be feeling quite nicely and I'm very pleased that the granulation in general. With that being said overall I feel like the patient has made excellent progress which is great news. No fevers, chills, nausea, or vomiting noted at this time. 09/06/18 on evaluation today patient actually appears to be doing rather well in regard to his right gluteal ulcer. This is showing signs of improvement in overall I'm very pleased with how things seem to be progressing. The patient likewise is please. Overall I see no evidence of infection he is about to complete his oral antibiotic regimen which is the end of the antibiotics for him in just about three days. 09/13/18 on evaluation today patient's right Ischial ulcer appears to be showing signs of continued improvement which is excellent news. He's been tolerating the dressing changes without complication. Fortunately there's no signs of infection and the wound that seems to be doing very well. 09/28/18 on evaluation today patient appears to be doing rather well in regard to his right Ischial ulcer. He's been tolerating the Wound VAC without complication he knows there's much less drainage than there used to be this obviously is not a bad thing in my pinion. There's no evidence of infection despite the fact is but nothing about it now for several weeks. 10/04/18 on evaluation today patient appears to be doing better in regard to his right Ischial wound. He has been tolerating the Wound VAC without complication and I do believe that the silver nitrate last week was beneficial for him. Fortunately overall there's no evidence of active infection at this time which is great news. No fevers, chills, nausea, or vomiting noted at this time. 10/11/18 on evaluation today patient actually appears to be doing rather well in regard  to his Ischial ulcer. He's been tolerating the Wound VAC still without complication I feel like this is doing a good job. No fevers, chills, nausea, or vomiting noted at this time. 11/01/18 on evaluation today patient presents after having not been seen in our clinic for several weeks secondary to the fact that he was on evaluation today patient presents after having not been seen in our clinic for several weeks secondary to the fact that he was in a skilled nursing facility which was on lockdown currently due to the covert 19 national emergency. Subsequently he was discharged from the facility on this past Friday and subsequently made an appointment to come in to see yesterday. Fortunately there's no signs of active infection at this time which is good news and overall he does  seem to have made progress since I last saw. Overall I feel like things are progressing quite nicely. The patient is having no pain. 11/08/18 on evaluation today patient appears to be doing okay in regard to his right gluteal ulcer. He has been utilizing a Wound VAC home health this changing this at this point since he's home from the skilled nursing facility. Fortunately there's no signs of obvious active infection at this time. Unfortunately though there's no obvious active infection he is having some maceration and his wife states that when the sheets of the Wound VAC office on Sunday when it broke seal that he ended up having significant issues with some smell as well there concerned about the possibility of infection. Fortunately there's No fevers, chills, nausea, or vomiting noted at this time. 11/15/18 on evaluation today patient actually appears to be doing well in regard to his right gluteal ulcer. He has been tolerating the dressing changes without complication. Specifically the Wound VAC has been utilized up to this point. Fortunately there's no signs of infection and overall I feel like he has made progress even  since last week when I last saw him. I'm actually fairly happy with the overall appearance although he does seem to have somewhat of a hyper granular Abebe, Gunther J. ([id number]) overgrowth in the central portion of the wound which I think may require some sharp debridement to try flatness out possibly utilizing chemical cauterization following. 11/23/18 on evaluation today patient actually appears to be doing very well in regard to his sacral ulcer. He seems to be showing signs of improvement with good granulation. With that being said he still has the small area of hyper granulation right in the central portion of the wound which I'm gonna likely utilize silver nitrate on today. Subsequently he also keeps having a leak at the 6 o'clock location which is unfortunate we may be able to help out with some suggestions to try to prevent this going forward. Fortunately there's no signs of active infection at this time. 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 He's been tolerating the dressing changes without complication. Fortunately there's no signs of active infection at this time. I've been rather pleased with how things have progressed there still some evidence of pressure getting to the area with some redness right around the immediate wound opening. Nonetheless other than this I'm not seeing any significant complications or issues the wound is somewhat hyper granular. Upon discussing with the patient and his wife today I'm not sure that the wound is being packed to the base with the foam at this point. And if it's not been packed fully that may be part of the reason why is not seen as much improvement as far as the granulation from the base out. Again we do not want pack too tightly but we need some of the firm to get to the base of the wound. I discussed this with patient and his wife  today. 12/06/18 on evaluation today patient appears to be doing well in regard to his right gluteal pressure ulcer. He's been tolerating the dressing changes without complication. Fortunately there's no signs of active infection. He still has some hyper granular tissue and I do think it would be appropriate to continue with the chemical cauterization as of today. 12/16/18 on evaluation today patient actually appears to be doing okay in regard to his right  gluteal ulcer. He is been tolerating the dressing changes without complication including the Wound VAC. Overall I feel like nothing seems to be worsening I do feel like that the hyper granulation buds in the central portion of the wound have improved to some degree with the silver nitrate. We will have to see how things continue to progress. 12/20/18 on evaluation today patient actually appears to be doing much worse in my pinion even compared to last week's evaluation. Unfortunately as opposed to showing any signs of improvement the areas of hyper granular tissue in the central portion of the wound seem to be getting worse. Subsequently the wound bed itself also seems to be getting deeper even compared to last week which is both unusual as well as concerning since prior he had been shown signs of improvement. Nonetheless I think that the issue could be that he's actually having some difficulty in issues with a deeper infection. There's no external signs of infection but nonetheless I am more worried about the internal, osteomyelitis, that could be restarting. He has not been on antibiotics for some time at this point. I think that it may be a good idea to go ahead and started back on an antibiotic therapy while we wait to see what the testing shows. Electronic Signature(s) Signed: 12/20/2018 3:03:34 PM By: Worthy Keeler [name] Entered By: Worthy Keeler on 12/20/2018 08:56:45 OTONIEL, MYHAND  ([id number]) -------------------------------------------------------------------------------- Physical Exam Details Patient Name: [name] Date of Service: 12/20/2018 8:00 AM Medical Record Number: [id number] Patient Account Number: [id number] Date of Birth/Sex: Dec 03, 1950 (68 y.o. M) Treating RN: Harold Barban Primary Care Provider: [name] Other Clinician: Referring Provider: [name] Treating Provider/Extender: [name], Nazire Fruth Weeks in Treatment: 21 Constitutional Well-nourished and well-hydrated in no acute distress. Respiratory normal breathing without difficulty. clear to auscultation bilaterally. Cardiovascular regular rate and rhythm with normal S1, S2. Psychiatric this patient is able to make decisions and demonstrates good insight into disease process. Alert and Oriented x 3. pleasant and cooperative. Notes Upon inspection today patient's wound bed actually appears to be doing worse compared to last week's evaluation. His measurements are actually significantly deeper them prior and I did double check the measurements myself in order to confirm and they were the same as what Di Kindle had initially gotten. This does have me worried also feel like as of today unable to actually feel some of the rough edges of where there seems to be bone very close to the surface of the base of the wound this does not appear to be filling in fact it seems to be going backward at this point. Again the only reason I can consider that this may be the case would be infection. Electronic Signature(s) Signed: 12/20/2018 3:03:34 PM By: Worthy Keeler [name] Entered By: Worthy Keeler on 12/20/2018 08:57:49 DUWAYNE, MATTERS ([id number]) -------------------------------------------------------------------------------- Physician Orders Details Patient Name: [name] Date of Service: 12/20/2018 8:00 AM Medical Record Number: [id number] Patient Account Number:  [id number] Date of Birth/Sex: 1950/09/18 (68 y.o. 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 Primary Wound Dressing Wound #1 Right Gluteal fold o Dry Gauze - wet to dry Secondary Dressing Wound #1 Right Gluteal fold o ABD pad - Tegaderm to secure Dressing Change Frequency Wound #1 Right Gluteal fold o Change Dressing Monday, Wednesday, Friday - and as needed Follow-up Appointments Wound #1 Right Gluteal fold o Return Appointment in 1 week. Off-Loading Wound #1 Right Gluteal fold o Mattress - Please continue air mattress at SNF o Turn and reposition every 2 hours [place] #1 Right Gluteal fold o [place] Visits STARSKY, NANNA ([id number]) o Asher Nurse may visit PRN to address patientos wound care needs. o FACE TO FACE ENCOUNTER: MEDICARE and MEDICAID PATIENTS: I certify that this patient is under my care and that I had a face-to-face encounter that meets the physician face-to-face encounter requirements with this patient on this date. The encounter with the patient was in whole or in part for the following MEDICAL CONDITION: (primary reason for [place]) MEDICAL NECESSITY: I certify, that based on my findings, NURSING services are a  medically necessary home health service. HOME BOUND STATUS: I certify that my clinical findings support that this patient is homebound (i.e., Due to illness or injury, pt requires aid of supportive devices such as crutches, cane, wheelchairs, walkers, the use of special transportation or the assistance of another person to leave their place of residence. There is a normal inability to leave the home and doing so requires considerable and taxing effort. Other absences are for medical reasons / religious services and are infrequent or of short duration when for other reasons). o If current dressing causes regression in wound condition, may D/C ordered dressing product/s and apply Normal Saline Moist Dressing daily until next [place] / Other MD appointment. [place] of regression in wound condition at [phone]. o Please direct any NON-WOUND related issues/requests for orders to patient's Primary Care Physician Negative Pressure Wound Therapy Wound #1 Right Gluteal fold o Place NPWT on HOLD. 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 Stop the Bactrim as patient is having an allergic reacation Electronic Signature(s) Signed: 12/21/2018 10:45:56 AM By: Worthy Keeler [name] Previous Signature: 12/20/2018 3:03:34 PM Version By: Worthy Keeler [name] Previous Signature: 12/20/2018 9:02:51 AM Version By: Worthy Keeler [name] Entered By: Worthy Keeler on  12/21/2018 10:45:55 SABRI, TEAL ([id number]) -------------------------------------------------------------------------------- Problem List Details Patient Name: [name] Date of Service: 12/20/2018 8:00 AM Medical Record Number: [id number] Patient Account Number: [id number] Date of Birth/Sex: [birth year] (68 y.o. M) Treating RN: Harold Barban Primary Care Provider: [name] Other Clinician: Referring Provider: [name] Treating Provider/Extender: [name], Mackensey Bolte Weeks in Treatment: 23 Active Problems ICD-10 Evaluated Encounter Code Description Active Date Today Diagnosis L89.314 Pressure ulcer of right buttock, stage 4 11/30/2017 No Yes  L03.317 Cellulitis of buttock 06/21/2018 No Yes G82.20 Paraplegia, unspecified 11/30/2017 No Yes S34.109S Unspecified injury to unspecified level of lumbar spinal cord, 11/30/2017 No Yes sequela I10 Essential (primary) hypertension 11/30/2017 No Yes Inactive Problems Resolved Problems Electronic Signature(s) Signed: 12/20/2018 3:03:34 PM By: Worthy Keeler [name] Entered By: Worthy Keeler on 12/20/2018 08:21:44 CARLOS, HEBER ([id number]) -------------------------------------------------------------------------------- Progress Note Details Patient Name: [name] Date of Service: 12/20/2018 8:00 AM Medical Record Number: [id number] Patient Account Number: [id number] Date of Birth/Sex: [birth year] (68 y.o. 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 He states that there is no history of diabetes that he is aware of. He does have issues with his prostate and is currently receiving treatment for this by way of oral medication. With that being said I do not have a lot of details in that regard. Nonetheless the patient presents today as a result of having been referred to [place] by another provider initially home health was set to come out and take care of his wound although due to the fact that he apparently drives he's not able to receive home health. His wife is therefore trying to help take care of this wound within although they have been struggling with what exactly to do at this point. She states that she can do some things but she is definitely not a nurse and does have some issues with looking at blood. The good news is the wound does not appear to be too deep and is fairly superficial at this point. There is no slough noted there is some nonviable skin noted around the surface of the wound and the perimeter at this point. The central portion of the wound appears to be very good with a dermal layer noted this does not appear to be again deep enough to extend it to subcutaneous tissue at this point. 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 In the pump he has Dilaudid and baclofen. 12/07/17 on evaluation today patient presents for follow-up concerning his ongoing lower back thigh ulcer on the right. He states that he did not get the supplies ordered and therefore has not really been able to perform the dressing changes as directed exactly. His wife was able to get some Boarder Foam Dressing's from the drugstore and subsequently has been using hydrogel which did help to a degree in the wound does appear to be able smaller. There is actually more drainage this week noted than  previous. 12/21/17 on evaluation today patient appears to be doing rather well in regard to his right gluteal ulcer. He has been tolerating the dressing changes without complication. There does not appear to be any evidence of infection at this point in time. Overall the wound does seem to be making some progress as far as the edges are concerned there's not as much in the way of overlapping of the external wound edges and he has a good epithelium  to wound bed border for the most part. This however is not true right at the 12 o'clock location over the span of a little over a centimeters which actually will require debridement today to clean this away and hopefully allow it to continue to heal more appropriately. 12/28/17 on evaluation today patient appears to be doing rather well in regard to his ulcer in the left gluteal region. He's been tolerating the dressing changes without complication. Apparently he has had some difficulty getting his dressing material. Apparently there's been some confusion with ordering we're gonna check into this. Nonetheless overall he's been showing signs of improvement which is good news. Debridement is not required today. 01/04/18 on evaluation today patient presents for follow-up concerning his right gluteal ulcer. He has been tolerating the dressing changes fairly well. On inspection today it appears he may actually have some maceration them concerned about the fact that he may be developing too much moisture in and around the wound bed which can cause delay in healing. With that being said he unfortunately really has not showed significant signs of improvement since last week's evaluation in fact this may even be just the little bit/slightly larger. Nonetheless he's been having a lot of discomfort I'm not sure this is even related to the wound as he has no pain when I'm to breeding or otherwise cleaning the wound during evaluation today. Nonetheless this is something  that we did recommend he talked to his pain specialist concerning. 01/11/18 on evaluation today patient appears to be doing better in regard to his ulceration. He has been tolerating the dressing Burroughs, Doniven J. ([id number]) changes without complication. With that being said overall there's no evidence of infection which is good news. The only thing is he did receive the hatch affair blue classic versus the ready nonetheless I feel like this is perfectly fine and appears to have done well for him over the past week. 01/25/18 on evaluation today patient's wound actually appears to be a little bit larger than during the last evaluation. The good news is the majority of the wound edges actually appear to be fairly firmly attached to the wound bed unfortunately again we're not really making progress in regard to the size. Roughly the wound is about the same size as when I first saw him although again the wound margin/edges appear to be much better. 02/01/18 on evaluation today patient actually appears to be doing very well in regard to his wound. Applying the Prisma dry does seem to be better although he does still have issues with slow progression of the wound. There was a slight improvement compared to last week's measurements today. Nonetheless I have been considering other options as far as the possibility of Theraskin or even a snap vac. In general I'm not sure that the Theraskin due to location of the wound would be a very good idea. Nonetheless I do think that a snap vac could be a possibility for the patient and in fact I think this could even be an excellent way to manage the wound possibly seeing some improvement in a very rapid fashion here. Nonetheless this is something that we would need to get approved and I did have a lengthy conversation with the patient about this today. 02/08/18 on evaluation today patient appears to be doing a little better in regard to his ulcer. He has been  tolerating the dressing changes without complication. Fortunately despite the fact that the wound is a little bit smaller it's  not significantly so unfortunately. We have discussed the possibility of a snap vac we did check with insurance this is actually covered at this point. Fortunately there does not appear to be any sign of infection. Overall I'm fairly pleased with how things seem to be appearing at this point. 02/15/18 on evaluation today patient appears to be doing rather well in regard to his right gluteal ulcer. Unfortunately the snap vac did not stay in place with his sheer and friction this came loose and did not seem to maintain seal very well. He worked for about two days and it did seem to do very well during that time according to his wife but in general this does not seem to be something that's gonna be beneficial for him long-term. I do believe we need to go back to standard dressings to see if we can find something that will be of benefit. 03/02/18- He is here in follow up evaluation; there is minimal change in the wound. He will continue with the same treatment plan, would consider changing to iodosrob/iodoflex if ulcer continues to to plateau. He will follow up next week 03/08/18 on evaluation today patient's wound actually appears to be about the same size as when I previously saw him several weeks back. Unfortunately he does have some slightly dark discoloration in the central portion of the wound which has me concerned about pressure injury. I do believe he may be sitting for too long a period of time in fact he tells me that "I probably sit for much too long". He does have some Slough noted on the surface of the wound and again as far as the size of the wound is concerned I'm really not seeing anything that seems to have improved significantly. 03/15/18 on evaluation today patient appears to be doing fairly well in regard to his ulcer. The wound measured pretty much about the  same today compared to last week's evaluation when looking at his graph. With that being said the area of bruising/deep tissue injury that was noted last week I do not see at this point. He did get a new cushion fortunately this does seem to be have been of benefit in my pinion. It does appear that he's been off of this more which is good news as well I think that is definitely showing in the overall wound measurements. With that being said I do believe that he needs to continue to offload I don't think that the fact this is doing better should be or is going to allow him to not have to offload and explain this to him as well. Overall he seems to be in agreement the plan I think he understands. The overall appearance of the wound bed is improved compared to last week I think the Iodoflex has been beneficial in that regard. 03/29/18 on evaluation today patient actually appears to be doing rather well in regard to his wound from the overall appearance standpoint he does have some granulation although there's some Slough on the surface of the wound noted as well. With that being said he unfortunately has not improved in regard to the overall measurement of the wound in volume or in size. I did have a discussion with him very specifically about offloading today. He actually does work although he mainly is just sitting throughout the day. He tells me he offloads by "lifting himself up for 30 seconds off of his chair occasionally" purchase from [employer] which does seem to have  helped. And he has a new cushion that he with that being said he's also able to stand some for a very short period of time but not significant enough I think to provide appropriate offloading. I think the biggest issue at this point with the wound and the fact is not healing as quickly as we would like is due to the fact that he is really not able to appropriately offload while at work. He states the beginning after his injury  he actually had a bed at his job that he could lay on in order to offload and that does seem to have been of help back at that time. Nonetheless he had not done this in quite some time unfortunately. I think that could be helpful for him this is something I would like for him to look into. CHENEY, EWART ([id number]) 04/05/18 on evaluation today patient actually presents for follow-up concerning his right gluteal ulcer. Again he really is not significantly improved even compared to last week. He has been tolerating the dressing changes without complication. With that being said fortunately there appears to be no evidence of infection at this time. He has been more proactive in trying to offload. 04/12/18 on evaluation today patient actually appears to be doing a little better in regard to his wound and the right gluteal fold region. He's been tolerating the dressing changes since removing the oasis without complication. However he was having a lot of burning initially with the oasis in place. He's unsure of exactly why this was given so much discomfort but he assumes that it was the oasis itself causing the problem. Nonetheless this had to be removed after about three days in place although even those three days seem to have made a fairly good improvement in regard to the overall appearance of the wound bed. In fact is the first time that he's made any improvement from the standpoint of measurements in about six weeks. He continues to have no discomfort over the area of the wound itself which leads me to wonder why he was having the burning with the oasis when he does not even feel the actual debridement's themselves. I am somewhat perplexed by this. 04/19/18 on evaluation today patient's wound actually appears to be showing signs of epithelialization around the edge of the wound and in general actually appears to be doing better which is good news. He did have the same burning after about  three days with applying the Endoform last week in the same fashion that I would generally apply a skin substitute. This seems to indicate that it's not the oasis to cause the problem but potentially the moisture buildup that just causes things to burn or there may be some other reaction with the skin prep or Steri-Strips. Nonetheless I'm not sure that is gonna be able to tolerate any skin substitute for a long period of time. The good news is the wound actually appears to be doing better today compared to last week and does seem to finally be making some progress. 04/26/18 on evaluation today patient actually appears to be doing rather well in regard to his ulcer in the right gluteal fold. He has been tolerating the dressing changes without complication which is good news. The Endoform does seem to be helping although he was a little bit more macerated this week. This seems to be an ongoing issue with fluid control at this point. Nonetheless I think we may be able to add something like  Drawtex to help control the drainage. 05/03/18 on evaluation today patient appears to actually be doing better in regard to the overall appearance of his wound. He has been tolerating the dressing changes without complication. Fortunately there appears to be no evidence of infection at this time. I really feel like his wound has shown signs as of today of turning around last week I thought so as well and definitely he could be seen in this week's overall appearance and measurements. In general I'm very pleased with the fact that he finally seems to be making a steady but sure progress. The patient likewise is very pleased. 05/17/18 on evaluation today patient appears to be doing more poorly unfortunately in regard to his ulcer. He has been tolerating the dressing changes without complication. With that being said he tells me that in the past couple of days he and his wife have noticed that we did not seem to be doing  quite as well is getting dark near the center. Subsequently upon evaluation today the wound actually does appear to be doing worse compared to previous. He has been tolerating the dressing changes otherwise and he states that he is not been sitting up anymore than he was in the past from what he tells me. Still he has continued to work he states "I'm tired of dealing with this and if I have to just go home and lay in the bed all the time that's what I'll do". Nonetheless I am concerned about the fact that this wound does appear to be deeper than what it was previous. 05/24/18 upon evaluation today patient actually presents after having been in the hospital due to what was presumed to be sepsis secondary to the wound infection. He had an elevated white blood cell count between 14 and 15. With that being said he does seem to be doing somewhat better now. His wound still is giving him some trouble nonetheless and he is obviously concerned about the fact likely talked about that this does seem to go more deeply than previously noted. I did review his wound culture which showed evidence of Staphylococcus aureus him and group B strep. Nonetheless he is on antibiotics, Levaquin, for this. Subsequently I did review his intake summary from the hospital as well. I also did look at the CT of the lumbar spine with contrast that was performed which showed no bone destruction to suggest lumbar disguises/osteomyelitis or sacral osteomyelitis. There was no paraspinal abscess. Nonetheless it appears this may have been more of just a soft tissue infection at this point which is good news. He still is nonetheless concerned about the wound which again I think is completely reasonable considering everything he's been through recently. 05/31/18 on evaluation today on evaluation today patient actually appears to be showing signs of his wound be a little bit deeper than what I would like to see. Fortunately he does not show  any signs of significant infection although his temperature was 99 today he states he's been checking this at home and has not been elevated. Nonetheless with the undermining that I'm seeing at this point I am becoming more concerned about the wound I do think that offloading is a key factor here that is preventing the speedy recovery at this point. There does not appear to be any evidence of again over infection noted. He's been using Santyl currently. DEMONTEZ, NOVACK ([id number]) 06/07/18 the patient presents today for follow-up evaluation regarding the left ulcer in the gluteal region.  He has been tolerating the Wound VAC fairly well. He is obviously very frustrated with this he states that to mean is really getting in his way. There does not appear to be any evidence of infection at this time he does have a little bit of odor I do not necessarily associate this with infection just something that we sometimes notice with Wound VAC therapy. With that being said I can definitely catch a tone of discontentment overall in the patient's demeanor today. This when he was previously in the hospital an CT scan was done of the lumbar region which did not reveal any signs of osteomyelitis. With that being said the pelvis in particular was not evaluated distinctly which means he could still have some osteonecrosis I. Nonetheless the Wound VAC was started on Thursday I do want to get this little bit more time before jumping to a CT scan of the pelvis although that is something that I might would recommend if were not see an improvement by that time. 06/14/18 on evaluation today patient actually appears to be doing about the same in regard to his right gluteal ulcer. Again he did have a CT scan of the lumbar spine unfortunately this did not include the pelvis. Nonetheless with the depth of the wound that I'm seeing today even despite the fact that I'm not seeing any evidence of overt cellulitis I believe  there's a good chance that we may be dealing with osteomyelitis somewhere in the right Ischial region. No fevers, chills, nausea, or vomiting noted at this time. 06/21/18 on evaluation today patient actually appears to be doing about the same with regard to his wound. The tunnel at 6 o'clock really does not appear to be any deeper although it is a little bit wider. I think at this point you may want to start packing this with white phone. Unfortunately I have not got approval for the CT scan of the pelvis as of yet due to the fact that Medicare apparently has been denied it due to the diagnosis codes not being appropriate according to Medicare for the test requested. With that being said the patient cannot have an MRI and therefore this is the only option that we have as far as testing is concerned. The patient has had infection and was on antibiotics and been added code for cellulitis of the bottom to see if this will be appropriate for getting the test approved. Nonetheless I'm concerned about the infection have been spread deeper into the Ischial region. 06/28/18 on evaluation today patient actually appears to be doing rather well all things considered in regard to the right gluteal ulcer. He has been tolerating the dressing changes without complication. With that being said the Wound VAC he states does have to be replaced almost every day or at least reinforced unfortunately. Patient actually has his CT scan later this morning we should have the results by tomorrow. 07/05/18 on evaluation today patient presents for follow-up concerning his right Ischial ulcer. He did see the surgeon Dr. Lysle Pearl last week. They were actually very happy with him and felt like he spent a tremendous amount of time with them as far as discussing his situation was concerned. In the end Dr. Lysle Pearl did contact me as well and determine that he would not recommend any surgical intervention at this point as he felt like it  would not be in the patient's best interest based on what he was seeing. He recommended a referral to infectious disease.  Subsequently this is something that Dr. Ines Bloomer office is working on setting up for the patient. As far as evaluation today is concerned the patient's wound actually appears to be worse at this point. I am concerned about how things are progressing and specifically about infection. I do not feel like it's the deeper but the area of depth is definitely widening which does have me concerned. No fevers, chills, nausea, or vomiting noted at this time. I think that we do need initiate antibiotic therapy the patient has an allow allergy to amoxicillin/penicillin he states that he gets a rash since childhood. Nonetheless she's never had the issues with Catholics or cephalosporins in general but he is aware of. 07/27/18 on evaluation today patient presents following admission to the hospital on 07/09/18. He was subsequently discharged on 07/20/18. On 07/15/18 the patient underwent irrigation and debridement was soft tissue biopsy and bone biopsy as well as placement of a Wound VAC in the OR by Dr. Celine Ahr. During the hospital course the patient was placed on a Wound VAC and recommended follow up with surgery in three weeks actually with Dr. Delaine Lame who is infectious disease. The patient was on vancomycin during the hospital course. 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 He also had a bone culture which revealed evidence of methicillin-resistant staph aureus. He is updated CT scan 07/09/18 reveals that he had progression of the which was performed on wound to breakdown down to the trochanter where he actually had irregularities there as well suggestive of osteomyelitis. This was a change just since 9 December when we last performed a CT scan. Obviously this one had gone downhill quite significantly and rapidly. At this point upon evaluation I feel like  in general the patient's wound seems to be doing fairly well all things considered upon my evaluation today. Obviously this is larger and deeper than what I previously evaluated but at the same time he seems to be making some progress as far as the appearance of the granulation tissue is concerned. I'm happy in that regard. No fevers, chills, nausea, or vomiting noted at this time. He is on IV vancomycin and Rocephin at the facility. He is currently in [facility]. 08/03/18 upon evaluation today patient's wound appears to be doing better in regard to the overall appearance at this point in time. Fortunately he's been tolerating the Wound VAC without complication and states that the facility has been taking CAELEB, BATALLA. ([id number]) excellent care of the wound site. Overall I see some Slough noted on the surface which I am going to attempt sharp debridement today of but nonetheless other than this I feel like he's making progress. 08/09/18 on evaluation today patient's wound appears to be doing much better compared to even last week's evaluation. Do believe that the Wound VAC is been of great benefit for him. He has been tolerating the dressing changes that is the Wound VAC without any complication and he has excellent granulation noted currently. There is no need for sharp debridement at this point. 08/16/18 on evaluation today patient actually appears to be doing very well in regard to the wound in the right gluteal fold region. This is showing signs of progress and again appears to be very healthy which is excellent news. Fortunately there is no sign of active infection by way of odor or drainage at this point. Overall I'm very pleased with how things stand. He seems to be tolerating the Wound VAC without complication.  08/23/18 on evaluation today patient actually appears to be doing better in regard to his wound. He has been tolerating the Wound VAC without complication and in fact it  has been collecting a significant amount of drainage which I think is good news especially considering how the wound appears. Fortunately there is no signs of infection at this time definitely nothing appears to be worse which is good news. He has not been started on the Bactrim and Flagyl that was recommended by Dr. Delaine Lame yet. I did actually contact her office this morning in order to check and see were things are that regard their gonna be calling me back. 08/30/18 on evaluation today patient actually appears to show signs of excellent improvement today compared to last evaluation. The undermining is getting much better the wound seems to be feeling quite nicely and I'm very pleased that the granulation in general. With that being said overall I feel like the patient has made excellent progress which is great news. No fevers, chills, nausea, or vomiting noted at this time. 09/06/18 on evaluation today patient actually appears to be doing rather well in regard to his right gluteal ulcer. This is showing signs of improvement in overall I'm very pleased with how things seem to be progressing. The patient likewise is please. Overall I see no evidence of infection he is about to complete his oral antibiotic regimen which is the end of the antibiotics for him in just about three days. 09/13/18 on evaluation today patient's right Ischial ulcer appears to be showing signs of continued improvement which is excellent news. He's been tolerating the dressing changes without complication. Fortunately there's no signs of infection and the wound that seems to be doing very well. 09/28/18 on evaluation today patient appears to be doing rather well in regard to his right Ischial ulcer. He's been tolerating the Wound VAC without complication he knows there's much less drainage than there used to be this obviously is not a bad thing in my pinion. There's no evidence of infection despite the fact is but nothing  about it now for several weeks. 10/04/18 on evaluation today patient appears to be doing better in regard to his right Ischial wound. He has been tolerating the Wound VAC without complication and I do believe that the silver nitrate last week was beneficial for him. Fortunately overall there's no evidence of active infection at this time which is great news. No fevers, chills, nausea, or vomiting noted at this time. 10/11/18 on evaluation today patient actually appears to be doing rather well in regard to his Ischial ulcer. He's been tolerating the Wound VAC still without complication I feel like this is doing a good job. No fevers, chills, nausea, or vomiting noted at this time. 11/01/18 on evaluation today patient presents after having not been seen in our clinic for several weeks secondary to the fact that he was on evaluation today patient presents after having not been seen in our clinic for several weeks secondary to the fact that he was in a skilled nursing facility which was on lockdown currently due to the covert 19 national emergency. Subsequently he was discharged from the facility on this past Friday and subsequently made an appointment to come in to see yesterday. Fortunately there's no signs of active infection at this time which is good news and overall he does seem to have made progress since I last saw. Overall I feel like things are progressing quite nicely. The patient is having no  pain. 11/08/18 on evaluation today patient appears to be doing okay in regard to his right gluteal ulcer. He has been utilizing a Wound VAC home health this changing this at this point since he's home from the skilled nursing facility. Fortunately there's no signs of obvious active infection at this time. Unfortunately though there's no obvious active infection he is having some maceration and his wife states that when the sheets of the Wound VAC office on Sunday when it broke seal that he ended up having  significant issues with some smell as well there concerned about the possibility of infection. Fortunately there's No fevers, chills, nausea, or vomiting noted at this time. AUSTYN, SEIER ([id number]) 11/15/18 on evaluation today patient actually appears to be doing well in regard to his right gluteal ulcer. He has been tolerating the dressing changes without complication. Specifically the Wound VAC has been utilized up to this point. Fortunately there's no signs of infection and overall I feel like he has made progress even since last week when I last saw him. I'm actually fairly happy with the overall appearance although he does seem to have somewhat of a hyper granular overgrowth in the central portion of the wound which I think may require some sharp debridement to try flatness out possibly utilizing chemical cauterization following. 11/23/18 on evaluation today patient actually appears to be doing very well in regard to his sacral ulcer. He seems to be showing signs of improvement with good granulation. With that being said he still has the small area of hyper granulation right in the central portion of the wound which I'm gonna likely utilize silver nitrate on today. Subsequently he also keeps having a leak at the 6 o'clock location which is unfortunate we may be able to help out with some suggestions to try to prevent this going forward. Fortunately there's no signs of active infection at this time. 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 He's been tolerating the dressing changes without complication. Fortunately there's no signs of active infection at this time. I've been rather pleased with how things have progressed there still some evidence of pressure getting to the area with some redness right around the immediate wound opening. Nonetheless other than this I'm not seeing any  significant complications or issues the wound is somewhat hyper granular. Upon discussing with the patient and his wife today I'm not sure that the wound is being packed to the base with the foam at this point. And if it's not been packed fully that may be part of the reason why is not seen as much improvement as far as the granulation from the base out. Again we do not want pack too tightly but we need some of the firm to get to the base of the wound. I discussed this with patient and his wife today. 12/06/18 on evaluation today patient appears to be doing well in regard to his right gluteal pressure ulcer. He's been tolerating the dressing changes without complication. Fortunately there's no signs of active infection. He still has some hyper granular tissue and I do think it would be appropriate to continue with the chemical cauterization as of today. 12/16/18 on evaluation today patient actually appears to be doing okay in regard to his right gluteal ulcer. He is been tolerating the dressing changes without complication including the Wound VAC. Overall I feel like nothing seems to be  worsening I do feel like that the hyper granulation buds in the central portion of the wound have improved to some degree with the silver nitrate. We will have to see how things continue to progress. 12/20/18 on evaluation today patient actually appears to be doing much worse in my pinion even compared to last week's evaluation. Unfortunately as opposed to showing any signs of improvement the areas of hyper granular tissue in the central portion of the wound seem to be getting worse. Subsequently the wound bed itself also seems to be getting deeper even compared to last week which is both unusual as well as concerning since prior he had been shown signs of improvement. Nonetheless I think that the issue could be that he's actually having some difficulty in issues with a deeper infection. There's no external signs of  infection but nonetheless I am more worried about the internal, osteomyelitis, that could be restarting. He has not been on antibiotics for some time at this point. I think that it may be a good idea to go ahead and started back on an antibiotic therapy while we wait to see what the testing shows. Patient History Information obtained from Patient. Family History Hypertension - Father, Stroke - Mother, No family history of Cancer, Diabetes, Heart Disease, Kidney Disease, Lung Disease, Seizures, Thyroid Problems, Tuberculosis. Social History Never smoker, Marital Status - Married, Alcohol Use - Never, Drug Use - No History, Caffeine Use - Daily. Medical History Eyes Patient has history of Cataracts - both removed Denies history of Glaucoma, Optic Neuritis Ear/Nose/Mouth/Throat Denies history of Chronic sinus problems/congestion, Middle ear problems Hematologic/Lymphatic SAYVION, VIGEN ([id number]) Denies history of Anemia, Hemophilia, Human Immunodeficiency Virus, Lymphedema Respiratory Denies history of Aspiration, Asthma, Chronic Obstructive Pulmonary Disease (COPD), Pneumothorax, Sleep Apnea, Tuberculosis Cardiovascular Patient has history of Hypertension - takes medication Denies history of Angina, Arrhythmia, Congestive Heart Failure, Coronary Artery Disease, Deep Vein Thrombosis, Hypotension, Myocardial Infarction, Peripheral Arterial Disease, Peripheral Venous Disease, Phlebitis, Vasculitis Gastrointestinal Denies history of Cirrhosis , Colitis, Crohn s, Hepatitis A, Hepatitis B, Hepatitis C Endocrine Denies history of Type I Diabetes, Type II Diabetes Genitourinary Denies history of End Stage Renal Disease Immunological Denies history of Lupus Erythematosus, Raynaud s, Scleroderma Integumentary (Skin) Denies history of History of Burn, History of pressure wounds Musculoskeletal Denies history of Gout, Rheumatoid Arthritis, Osteoarthritis,  Osteomyelitis Neurologic Patient has history of Paraplegia - waist down Denies history of Dementia, Neuropathy, Quadriplegia, Seizure Disorder Oncologic Denies history of Received Chemotherapy, Received Radiation Psychiatric Denies history of Anorexia/bulimia, Confinement Anxiety Medical And Surgical History Notes Oncologic Prostate cancer- currently treated with horomone therapy Review of Systems (ROS) Constitutional Symptoms (General Health) Denies complaints or symptoms of Fatigue, Fever, Chills, Marked Weight Change. Respiratory Denies complaints or symptoms of Chronic or frequent coughs, Shortness of Breath. Cardiovascular Denies complaints or symptoms of Chest pain, LE edema. Psychiatric Denies complaints or symptoms of Anxiety, Claustrophobia. Objective Constitutional Well-nourished and well-hydrated in no acute distress. Vitals Time Taken: 8:22 AM, Height: 73 in, Weight: 210 lbs, BMI: 27.7, Temperature: 98.5 F, Pulse: 75 bpm, Respiratory Rate: 16 breaths/min, Blood Pressure: 131/77 mmHg. Respiratory Klimas, Craig J. ([id number]) normal breathing without difficulty. clear to auscultation bilaterally. Cardiovascular regular rate and rhythm with normal S1, S2. Psychiatric this patient is able to make decisions and demonstrates good insight into disease process. Alert and Oriented x 3. pleasant and cooperative. General Notes: Upon inspection today patient's wound bed actually appears to be doing worse compared to last week's evaluation. His measurements  are actually significantly deeper them prior and I did double check the measurements myself in order to confirm and they were the same as what Di Kindle had initially gotten. This does have me worried also feel like as of today unable to actually feel some of the rough edges of where there seems to be bone very close to the surface of the base of the wound this does not appear to be filling in fact it seems to be going  backward at this point. Again the only reason I can consider that this may be the case would be infection. Integumentary (Hair, Skin) Wound #1 status is Open. Original cause of wound was Pressure Injury. The wound is located on the Right Gluteal fold. The wound measures 3.7cm length x 3.4cm width x 4cm depth; 9.88cm^2 area and 39.521cm^3 volume. There is muscle and Fat Layer (Subcutaneous Tissue) Exposed exposed. There is no tunneling noted, however, there is undermining starting at 10:00 and ending at 2:00 with a maximum distance of 4.2cm. There is a large amount of serous drainage noted. The wound margin is epibole. There is large (67-100%) red, pink, hyper - granulation within the wound bed. There is a small (1-33%) amount of necrotic tissue within the wound bed including Adherent Slough. 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 Primary Wound Dressing: Wound #1 Right Gluteal fold: Dry Gauze - wet to dry Secondary Dressing: Wound #1 Right Gluteal fold: ABD pad - Tegaderm to secure RAYQUON, USELMAN ([id number]) Dressing Change Frequency: Wound #1 Right Gluteal fold: Change Dressing Monday, Wednesday, Friday - and as needed Follow-up Appointments: Wound #1 Right Gluteal fold: Return Appointment in 1 week. Off-Loading: Wound #1 Right Gluteal fold: Mattress - Please continue air mattress at SNF Turn and reposition every 2 hours Home Health: Wound #1 Right Gluteal fold: [place] Nurse may visit PRN to address patient s  wound care needs. FACE TO FACE ENCOUNTER: MEDICARE and MEDICAID PATIENTS: I certify that this patient is under my care and that I had a face-to-face encounter that meets the physician face-to-face encounter requirements with this patient on this date. The encounter with the patient was in whole or in part for the following MEDICAL CONDITION: (primary reason for [place]) MEDICAL NECESSITY: I certify, that based on my findings, NURSING services are a medically necessary home health service. HOME BOUND STATUS: I certify that my clinical findings support that this patient is homebound (i.e., Due to illness or injury, pt requires aid of supportive devices such as crutches, cane, wheelchairs, walkers, the use of special transportation or the assistance of another person to leave their place of residence. There is a normal inability to leave the home and doing so requires considerable and taxing effort. Other absences are for medical reasons / religious services and are infrequent or of short duration when for other reasons). If current dressing causes regression in wound condition, may D/C ordered dressing product/s and apply Normal Saline Moist Dressing daily until next [place] / Other MD appointment. [place] of regression in wound condition at [phone]. Please direct any NON-WOUND related issues/requests for  orders to patient's Primary Care Physician Negative Pressure Wound Therapy: Wound #1 Right Gluteal fold: Place NPWT on HOLD. - Dakins Soaked gauze, wet-to-dry, ABD pads ( to prevent moisture being trapped), Tegaderm to secure Radiology ordered were: Computed Tomography (CT) Scan - Pelvis with wound in the right gluteal fold The following medication(s) was prescribed: Bactrim DS oral 800 mg-160 mg tablet 1 1 tablet oral taken 2 times a day for 14 days starting 12/20/2018 clindamycin HCl oral 300 mg capsule 1 1 capsule oral taken every 8 hours x 14 days.  Stop the Bactrim as patient is having an allergic reacation starting 12/21/2018 My suggestion at this point is gonna be that we go ahead and initiate treatment with the Dakin s soaked gauze dressing and the patient is in agreement with plan. Subsequently we're gonna see were things stand at follow-up. If anything changes worsens he knows to contact the office and if you developed any fevers, chills, nausea, vomiting, or diarrhea he's go to the ER ASAP. Subsequently I'm in order a CT scan with contrast in order to further evaluate for the possibility of infection in the bone which is my concern at this point. Patient is in agreement with plan. I will send in a prescription for Bactrim DS as well to get things started. Please see above for specific wound care orders. We will see patient for re-evaluation in 1 week(s) here in the clinic. If anything worsens or changes patient will contact our office for additional recommendations. Addendum: Unfortunately patient had an issue with his Bactrim that I prescribed for him he's breaking out in hives. This evening despite the fact that he's taking this before earlier in the year when he was in the facility as prescribed the Dr. Delaine Lame. Nonetheless I had him discontinue the Bactrim and we will add that to his allergy list. I did sending clindamycin which he stated before without complication to the pharmacy for him. We will subsequently see how things do in that regard. Also recommended that he take either Benadryl or else something like Zyrtec or Allegra to help with the hives and allergic reaction. He is not having any facial swelling, lip swelling, tongue swelling, or difficulty breathing whatsoever. CHERON, PASQUARELLI ([id number]) Electronic Signature(s) Signed: 12/22/2018 2:50:08 AM By: Worthy Keeler [name] Previous Signature: 12/20/2018 3:03:34 PM Version By: Worthy Keeler [name] Entered By: Worthy Keeler on 12/21/2018 11:45:00 TREYSHAUN, KEATTS ([id number]) -------------------------------------------------------------------------------- ROS/PFSH Details Patient Name: [name] Date of Service: 12/20/2018 8:00 AM Medical Record Number: [id number] Patient Account Number: [id number] Date of Birth/Sex: Mar 19, 1951 (68 y.o. M) Treating RN: Harold Barban Primary Care Provider: [name] Other Clinician: Referring Provider: [name] Treating Provider/Extender: [name], Johnchristopher Sarvis Weeks in Treatment: 16 Information Obtained From Patient Constitutional Symptoms (General Health) Complaints and Symptoms: Negative for: Fatigue; Fever; Chills; Marked Weight Change Respiratory Complaints and Symptoms: Negative for: Chronic or frequent coughs; Shortness of Breath Medical History: Negative for: Aspiration; Asthma; Chronic Obstructive Pulmonary Disease (COPD); Pneumothorax; Sleep Apnea; Tuberculosis Cardiovascular Complaints and Symptoms: Negative for: Chest pain; LE edema Medical History: Positive for: Hypertension - takes medication Negative for: Angina; Arrhythmia; Congestive Heart Failure; Coronary Artery Disease; Deep Vein Thrombosis; Hypotension; Myocardial Infarction; Peripheral Arterial Disease; Peripheral Venous Disease; Phlebitis; Vasculitis Psychiatric Complaints and Symptoms: Negative for: Anxiety; Claustrophobia Medical History: Negative for: Anorexia/bulimia; Confinement Anxiety Eyes Medical History: Positive for: Cataracts - both removed Negative for: Glaucoma; Optic Neuritis Ear/Nose/Mouth/Throat Medical History: Negative for: Chronic sinus problems/congestion; Middle ear problems  Hematologic/Lymphatic Medical History: Negative for: Anemia; Hemophilia; Human Immunodeficiency Virus; Lymphedema JAKYRON, FABRO. ([id number]) Gastrointestinal Medical History: Negative for: Cirrhosis ; Colitis; Crohnos; Hepatitis A; Hepatitis B; Hepatitis C Endocrine Medical History: Negative for: Type I  Diabetes; Type II Diabetes Genitourinary Medical History: Negative for: End Stage Renal Disease Immunological Medical History: Negative for: Lupus Erythematosus; Raynaudos; Scleroderma Integumentary (Skin) Medical History: Negative for: History of Burn; History of pressure wounds Musculoskeletal Medical History: Negative for: Gout; Rheumatoid Arthritis; Osteoarthritis; Osteomyelitis Neurologic Medical History: Positive for: Paraplegia - waist down Negative for: Dementia; Neuropathy; Quadriplegia; Seizure Disorder Oncologic Medical History: Negative for: Received Chemotherapy; Received Radiation Past Medical History Notes: Prostate cancer- currently treated with horomone therapy HBO Extended History Items Eyes: Cataracts Immunizations Pneumococcal Vaccine: Received Pneumococcal Vaccination: No Implantable Devices No devices added Family and Social History Cancer: No; Diabetes: No; Heart Disease: No; Hypertension: Yes - Father; Kidney Disease: No; Lung Disease: No; Seizures: No; Stroke: Yes - Mother; Thyroid Problems: No; Tuberculosis: No; Never smoker; Marital Status - Married; Alcohol Use: Never; Drug Use: No History; Caffeine Use: Daily EPHRAM, KORNEGAY ([id number]) Physician Affirmation I have reviewed and agree with the above information. Electronic Signature(s) Signed: 12/20/2018 3:03:34 PM By: Worthy Keeler [name] Signed: 12/20/2018 3:11:16 PM By: Harold Barban Entered By: Worthy Keeler on 12/20/2018 08:57:03 AUSTEN, OYSTER ([id number]) -------------------------------------------------------------------------------- SuperBill Details Patient Name: [name] Date of Service: 12/20/2018 Medical Record Number: [id number] Patient Account Number: [id number] Date of Birth/Sex: [birth year] (68 y.o. 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, sequela I10 Essential (primary) hypertension Facility Procedures CPT4 Code: 93716967 Description: 99213 - WOUND CARE VISIT-LEV 3 EST PT Modifier: Quantity: 1 Physician Procedures CPT4 Code: 8938101 Description: 75102 - WC PHYS LEVEL 4 - EST PT ICD-10 Diagnosis Description L89.314 Pressure ulcer of right buttock, stage 4 L03.317 Cellulitis of buttock G82.20 Paraplegia, unspecified S34.109S Unspecified injury to unspecified level of lumbar spinal cor Modifier: d, sequela Quantity: 1 Electronic Signature(s) Signed: 12/20/2018 3:03:34 PM By: Worthy Keeler [name] Entered By: Worthy Keeler on 12/20/2018 09:03:16

## 2018-12-21 ENCOUNTER — Other Ambulatory Visit: Payer: Self-pay | Admitting: Physician Assistant

## 2018-12-21 DIAGNOSIS — L0231 Cutaneous abscess of buttock: Secondary | ICD-10-CM

## 2018-12-21 DIAGNOSIS — L89314 Pressure ulcer of right buttock, stage 4: Secondary | ICD-10-CM

## 2018-12-21 DIAGNOSIS — Z7409 Other reduced mobility: Secondary | ICD-10-CM | POA: Insufficient documentation

## 2018-12-21 DIAGNOSIS — L03317 Cellulitis of buttock: Secondary | ICD-10-CM

## 2018-12-21 DIAGNOSIS — B999 Unspecified infectious disease: Secondary | ICD-10-CM

## 2018-12-22 ENCOUNTER — Ambulatory Visit
Admission: RE | Admit: 2018-12-22 | Discharge: 2018-12-22 | Disposition: A | Discharge status: Home / Self Care | Payer: Medicare Other | Source: Ambulatory Visit | Attending: Physician Assistant | Admitting: Physician Assistant

## 2018-12-22 ENCOUNTER — Other Ambulatory Visit: Payer: Self-pay

## 2018-12-22 DIAGNOSIS — L03317 Cellulitis of buttock: Secondary | ICD-10-CM | POA: Insufficient documentation

## 2018-12-22 DIAGNOSIS — L0231 Cutaneous abscess of buttock: Secondary | ICD-10-CM | POA: Insufficient documentation

## 2018-12-22 NOTE — Progress Notes (Signed)
[name] ([id number]) Visit Report for 12/16/2018 Arrival Information Details Patient Name: [name], [name] Date of Service: 12/16/2018 8:00 AM Medical Record Number: [id number] Patient Account Number: [id number] Date of Birth/Sex: [birth year] (68 y.o. M) Treating RN: Harold Barban Primary Care Deerica Waszak: [name] Other Clinician: Referring Kamylah Manzo: [name] Treating Eleena Grater/Extender: [name], HOYT Weeks in Treatment: 23 Visit Information History Since Last Visit Added or deleted any medications: No Patient Arrived: Wheel Chair Any new allergies or adverse reactions: No Arrival Time: 08:08 Had a fall or experienced change in No Accompanied By: wife activities of daily living that may affect Transfer Assistance: None risk of falls: Patient Identification Verified: Yes Signs or symptoms of abuse/neglect since last visito No Secondary Verification Process Completed: Yes Hospitalized since last visit: No Patient Requires Transmission-Based No Has Dressing in Place as Prescribed: Yes Precautions: Pain Present Now: No Patient Has Alerts: Yes Patient Alerts: NOT Diabetic Electronic Signature(s) Signed: 12/16/2018 2:56:11 PM By: Harold Barban Entered By: Harold Barban on 12/16/2018 08:12:20 [name] ([id number]) -------------------------------------------------------------------------------- Encounter Discharge Information Details Patient Name: [name] Date of Service: 12/16/2018 8:00 AM Medical Record Number: [id number] Patient Account Number: [id number] Date of Birth/Sex: 03/30/1951 (68 y.o. M) Treating RN: Montey Hora Primary Care Taviana Westergren: [name] Other Clinician: Referring Lacey Dotson: [name] Treating Rosina Cressler/Extender: [name], HOYT Weeks in Treatment: 29 Encounter Discharge Information Items Discharge Condition: Stable Ambulatory Status: Wheelchair Discharge Destination:  Home Transportation: Private Auto Accompanied By: spouse Schedule Follow-up Appointment: Yes Clinical Summary of Care: Electronic Signature(s) Signed: 12/16/2018 8:47:13 AM By: Montey Hora Entered By: Montey Hora on 12/16/2018 08:47:13 [name] ([id number]) -------------------------------------------------------------------------------- Lower Extremity Assessment Details Patient Name: [name] Date of Service: 12/16/2018 8:00 AM Medical Record Number: [id number] Patient Account Number: [id number] Date of Birth/Sex: 1950/12/08 (68 y.o. M) Treating RN: Harold Barban Primary Care Montray Kliebert: [name] Other Clinician: Referring Juandavid Dallman: [name] Treating Sohum Delillo/Extender: [name], HOYT Weeks in Treatment: 54 Electronic Signature(s) Signed: 12/16/2018 2:56:11 PM By: Harold Barban Entered By: Harold Barban on 12/16/2018 08:24:30 QUINNLAN, ABRUZZO ([id number]) -------------------------------------------------------------------------------- Multi Wound Chart Details Patient Name: [name] Date of Service: 12/16/2018 8:00 AM Medical Record Number: [id number] Patient Account Number: [id number] Date of Birth/Sex: 08/23/1950 (68 y.o. M) Treating RN: [name] Primary Care Derricka Mertz: [name] Other Clinician: Referring Dayton Kenley: [name] Treating Deondra Wigger/Extender: [name], HOYT Weeks in Treatment: 54 Vital Signs Height(in): 73 Pulse(bpm): 85 Weight(lbs): 210 Blood Pressure(mmHg): 138/73 Body Mass Index(BMI): 28 Temperature(F): 98.6 Respiratory Rate 16 (breaths/min): Photos: [N/A:N/A] Wound Location: Right Gluteal fold N/A N/A Wounding Event: Pressure Injury N/A N/A Primary Etiology: Pressure Ulcer N/A N/A Comorbid History: Cataracts, Hypertension, N/A N/A Paraplegia Date Acquired: 11/02/2017 N/A N/A Weeks of Treatment: 54 N/A N/A Wound Status: Open N/A N/A Measurements L x W x D 3x3x3 N/A  N/A (cm) Area (cm) : 7.069 N/A N/A Volume (cm) : 21.206 N/A N/A % Reduction in Area: 29.70% N/A N/A % Reduction in Volume: -2010.00% N/A N/A Position 1 (o'clock): 12 Maximum Distance 1 (cm): 6 Tunneling: Yes N/A N/A Classification: Category/Stage IV N/A N/A Exudate Amount: Large N/A N/A Exudate Type: Serous N/A N/A Exudate Color: amber N/A N/A Wound Margin: Epibole N/A N/A Granulation Amount: Large (67-100%) N/A N/A Granulation Quality: Red, Pink, Hyper-granulation N/A N/A Necrotic Amount: Small (1-33%) N/A N/A Exposed Structures: Fat Layer (Subcutaneous N/A N/A Tissue) Exposed: Yes Muscle: Yes Fascia: No Biegler, Khayree J. ([id number]) Tendon: No Joint: No Bone: No Epithelialization: None N/A N/A Treatment Notes  Electronic Signature(s) Signed: 12/21/2018 5:54:26 PM By: Gretta Cool, BSN, RN, CWS, Kim RN, BSN Entered By: Gretta Cool, BSN, RN, CWS, Kim on 12/16/2018 08:29:41 MARITZA, GOL[place]OUGH ([id number]) -------------------------------------------------------------------------------- [place] Details Patient Name: [name] Date of Service: 12/16/2018 8:00 AM Medical Record Number: [id number] Patient Account Number: [id number] Date of Birth/Sex: Jun 26, 1951 (68 y.o. M) Treating RN: [name] Primary Care Zyden Suman: [name] Other Clinician: Referring Samhitha Rosen: [name] Treating Travone Georg/Extender: [name], HOYT Weeks in Treatment: 64 Active Inactive Orientation to the Wound Care Program Nursing Diagnoses: Knowledge deficit related to the wound healing center program Goals: Patient/caregiver will verbalize understanding of the [place] Program Date Initiated: 11/30/2017 Target Resolution Date: 12/21/2017 Goal Status: Active Interventions: Provide education on orientation to the wound center Notes: Pressure Nursing Diagnoses: Knowledge deficit related to management of pressures ulcers Goals: Patient/caregiver will  verbalize understanding of pressure ulcer management Date Initiated: 05/17/2018 Target Resolution Date: 05/28/2018 Goal Status: Active Interventions: Assess: immobility, friction, shearing, incontinence upon admission and as needed Notes: Wound/Skin Impairment Nursing Diagnoses: Impaired tissue integrity Goals: Patient/caregiver will verbalize understanding of skin care regimen Date Initiated: 11/30/2017 Target Resolution Date: 12/21/2017 Goal Status: Active Ulcer/skin breakdown will have a volume reduction of 30% by week 4 Date Initiated: 11/30/2017 Target Resolution Date: 12/21/2017 Goal Status: Active MYKAL, BATIZ ([id number]) Interventions: Assess patient/caregiver ability to obtain necessary supplies Assess patient/caregiver ability to perform ulcer/skin care regimen upon admission and as needed Assess ulceration(s) every visit Treatment Activities: Skin care regimen initiated : 11/30/2017 Notes: Electronic Signature(s) Signed: 12/21/2018 5:54:26 PM By: Gretta Cool, BSN, RN, CWS, Kim RN, BSN Entered By: Gretta Cool, BSN, RN, CWS, Kim on 12/16/2018 08:29:32 ROLLIE, HYNEK ([id number]) -------------------------------------------------------------------------------- Pain Assessment Details Patient Name: [name] Date of Service: 12/16/2018 8:00 AM Medical Record Number: [id number] Patient Account Number: [id number] Date of Birth/Sex: June 05, 1951 (68 y.o. M) Treating RN: Harold Barban Primary Care Adrielle Polakowski: [name] Other Clinician: Referring Mayerli Kirst: [name] Treating Moiz Ryant/Extender: [name], HOYT Weeks in Treatment: 62 Active Problems Location of Pain Severity and Description of Pain Patient Has Paino No Site Locations Pain Management and Medication Current Pain Management: Electronic Signature(s) Signed: 12/16/2018 2:56:11 PM By: Harold Barban Entered By: Harold Barban on 12/16/2018 08:12:29 [name]  ([id number]) -------------------------------------------------------------------------------- Patient/Caregiver Education Details Patient Name: [name] Date of Service: 12/16/2018 8:00 AM Medical Record Number: [id number] Patient Account Number: [id number] Date of Birth/Gender: [birth year] (68 y.o. M) Treating RN: [name] Primary Care Physician: [name] Other Clinician: Referring Physician: [name] Treating Physician/Extender: Sharalyn Ink in Treatment: 83 Education Assessment Education Provided To: Patient Education Topics Provided Wound/Skin Impairment: Handouts: Caring for Your Ulcer Methods: Demonstration, Explain/Verbal Responses: State content correctly Electronic Signature(s) Signed: 12/21/2018 5:54:26 PM By: Gretta Cool, BSN, RN, CWS, Kim RN, BSN Entered By: Gretta Cool, BSN, RN, CWS, Kim on 12/16/2018 08:33:38 AZARYAH, OLEKSY ([id number]) -------------------------------------------------------------------------------- Wound Assessment Details Patient Name: [name] Date of Service: 12/16/2018 8:00 AM Medical Record Number: [id number] Patient Account Number: [id number] Date of Birth/Sex: 21-May-1951 (68 y.o. M) Treating RN: Harold Barban Primary Care Jamal Pavon: [name] Other Clinician: Referring Beryle Zeitz: [name] Treating Rockey Guarino/Extender: [name], HOYT Weeks in Treatment: 54 Wound Status Wound Number: 1 Primary Etiology: Pressure Ulcer Wound Location: Right Gluteal fold Wound Status: Open Wounding Event: Pressure Injury Comorbid History: Cataracts, Hypertension, Paraplegia Date Acquired: 11/02/2017 Weeks Of Treatment: 54 Clustered Wound: No Photos Wound Measurements Length: (cm) 3 Width: (cm) 3 Depth: (cm) 3 Area: (cm) 7.069 Volume: (cm)  21.206 % Reduction in Area: 29.7% % Reduction in Volume: -2010% Epithelialization: None Tunneling: Yes Position (o'clock): 12 Maximum Distance: (cm)  6 Undermining: No Wound Description Classification: Category/Stage IV Wound Margin: Epibole Exudate Amount: Large Exudate Type: Serous Exudate Color: amber Foul Odor After Cleansing: No Slough/Fibrino Yes Wound Bed Granulation Amount: Large (67-100%) Exposed Structure Granulation Quality: Red, Pink, Hyper-granulation Fascia Exposed: No Necrotic Amount: Small (1-33%) Fat Layer (Subcutaneous Tissue) Exposed: Yes Necrotic Quality: Adherent Slough Tendon Exposed: No Muscle Exposed: Yes Necrosis of Muscle: No Joint Exposed: No WERNER, LABELLA ([id number]) Bone Exposed: No Electronic Signature(s) Signed: 12/16/2018 2:56:11 PM By: Harold Barban Entered By: Harold Barban on 12/16/2018 08:24:18 [name] ([id number]) -------------------------------------------------------------------------------- Vitals Details Patient Name: [name] Date of Service: 12/16/2018 8:00 AM Medical Record Number: [id number] Patient Account Number: [id number] Date of Birth/Sex: 10/25/1950 (68 y.o. M) Treating RN: Harold Barban Primary Care Khalessi Blough: [name] Other Clinician: Referring Korena Nass: [name] Treating Emilina Smarr/Extender: [name], HOYT Weeks in Treatment: 54 Vital Signs Time Taken: 08:13 Temperature (F): 98.6 Height (in): 73 Pulse (bpm): 85 Weight (lbs): 210 Respiratory Rate (breaths/min): 16 Body Mass Index (BMI): 27.7 Blood Pressure (mmHg): 138/73 Reference Range: 80 - 120 mg / dl Electronic Signature(s) Signed: 12/16/2018 2:56:11 PM By: Harold Barban Entered By: Harold Barban on 12/16/2018 08:13:55

## 2018-12-22 NOTE — Progress Notes (Signed)
[name], [name] ([id number]) Visit Report for 12/16/2018 Chief Complaint Document Details Patient Name: [name] Date of Service: 12/16/2018 8:00 AM Medical Record Number: [id number] Patient Account Number: [id number] Date of Birth/Sex: November 09, 1950 (68 y.o. M) Treating RN: [name] Primary Care Provider: [name] Other Clinician: Referring Provider: [name] Treating Provider/Extender: [name], Zofia Peckinpaugh Weeks in Treatment: 87 Information Obtained from: Patient Chief Complaint Right gluteal fold ulcer Electronic Signature(s) Signed: 12/16/2018 4:36:20 PM By: Worthy Keeler [name] Entered By: Worthy Keeler on 12/16/2018 08:45:42 Brett, [name] ([id number]) -------------------------------------------------------------------------------- HPI Details Patient Name: [name] Date of Service: 12/16/2018 8:00 AM Medical Record Number: [id number] Patient Account Number: [id number] Date of Birth/Sex: [birth year] (68 y.o. 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 He states that there is no history of diabetes that he is aware of. He does have issues with his prostate and is currently receiving treatment for this by way of oral medication. With that being said I do not have a lot of details in that regard. Nonetheless the patient presents today as a result of having been referred to [place] by another provider initially home health was set to come out and take care of his wound although due to the fact that he  apparently drives he's not able to receive home health. His wife is therefore trying to help take care of this wound within although they have been struggling with what exactly to do at this point. She states that she can do some things but she is definitely not a nurse and does have some issues with looking at blood. The good news is the wound does not appear to be too deep and is fairly superficial at this point. There is no slough noted there is some nonviable skin noted around the surface of the wound and the perimeter at this point. The central portion of the wound appears to be very good with a dermal layer noted this does not appear to be again deep enough to extend it to subcutaneous tissue at this point. 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 In the pump he has Dilaudid and baclofen. 12/07/17 on evaluation today patient presents for follow-up concerning his ongoing lower back thigh ulcer on the right. He states that he did not get the supplies ordered and therefore has not really been able to perform the dressing changes as directed exactly. His wife was able to get some Boarder Foam Dressing's from the drugstore and subsequently has been using hydrogel which did help to a degree in the wound does appear to be able smaller. There is actually more drainage this week noted than previous. 12/21/17 on evaluation today patient appears to be doing rather well in regard to his right gluteal ulcer. He has been tolerating the dressing changes without complication. There does not appear to be any evidence of infection at this point in time. Overall the wound does seem to be making some progress as far as the edges  are concerned there's not as much in the way of overlapping of the external wound edges and he has a good epithelium to wound bed border for the most part. This however is not true  right at the 12 o'clock location over the span of a little over a centimeters which actually will require debridement today to clean this away and hopefully allow it to continue to heal more appropriately. 12/28/17 on evaluation today patient appears to be doing rather well in regard to his ulcer in the left gluteal region. He's been tolerating the dressing changes without complication. Apparently he has had some difficulty getting his dressing material. Apparently there's been some confusion with ordering we're gonna check into this. Nonetheless overall he's been showing signs of improvement which is good news. Debridement is not required today. 01/04/18 on evaluation today patient presents for follow-up concerning his right gluteal ulcer. He has been tolerating the dressing changes fairly well. On inspection today it appears he may actually have some maceration them concerned about the fact that he may be developing too much moisture in and around the wound bed which can cause delay in healing. With that being said he unfortunately really has not showed significant signs of improvement since last week's evaluation in fact this may even be just the little bit/slightly larger. Nonetheless he's been having a lot of discomfort I'm not sure this is even related to the wound as he has no pain when I'm to breeding or otherwise cleaning the wound during evaluation today. Nonetheless this is something that we did recommend he talked to his pain specialist concerning. 01/11/18 on evaluation today patient appears to be doing better in regard to his ulceration. He has been tolerating the dressing changes without complication. With that being said overall there's no evidence of infection which is good news. The only thing is he did receive the hatch affair blue classic versus the ready nonetheless I feel like this is perfectly fine and appears to have done well for him over the past week. 01/25/18 on evaluation  today patient's wound actually appears to be a little bit larger than during the last evaluation. The good JOHNTE, PORTNOY. ([id number]) news is the majority of the wound edges actually appear to be fairly firmly attached to the wound bed unfortunately again we're not really making progress in regard to the size. Roughly the wound is about the same size as when I first saw him although again the wound margin/edges appear to be much better. 02/01/18 on evaluation today patient actually appears to be doing very well in regard to his wound. Applying the Prisma dry does seem to be better although he does still have issues with slow progression of the wound. There was a slight improvement compared to last week's measurements today. Nonetheless I have been considering other options as far as the possibility of Theraskin or even a snap vac. In general I'm not sure that the Theraskin due to location of the wound would be a very good idea. Nonetheless I do think that a snap vac could be a possibility for the patient and in fact I think this could even be an excellent way to manage the wound possibly seeing some improvement in a very rapid fashion here. Nonetheless this is something that we would need to get approved and I did have a lengthy conversation with the patient about this today. 02/08/18 on evaluation today patient appears to be doing a little better in regard to his ulcer.  He has been tolerating the dressing changes without complication. Fortunately despite the fact that the wound is a little bit smaller it's not significantly so unfortunately. We have discussed the possibility of a snap vac we did check with insurance this is actually covered at this point. Fortunately there does not appear to be any sign of infection. Overall I'm fairly pleased with how things seem to be appearing at this point. 02/15/18 on evaluation today patient appears to be doing rather well in regard to his right gluteal  ulcer. Unfortunately the snap vac did not stay in place with his sheer and friction this came loose and did not seem to maintain seal very well. He worked for about two days and it did seem to do very well during that time according to his wife but in general this does not seem to be something that's gonna be beneficial for him long-term. I do believe we need to go back to standard dressings to see if we can find something that will be of benefit. 03/02/18- He is here in follow up evaluation; there is minimal change in the wound. He will continue with the same treatment plan, would consider changing to iodosrob/iodoflex if ulcer continues to to plateau. He will follow up next week 03/08/18 on evaluation today patient's wound actually appears to be about the same size as when I previously saw him several weeks back. Unfortunately he does have some slightly dark discoloration in the central portion of the wound which has me concerned about pressure injury. I do believe he may be sitting for too long a period of time in fact he tells me that "I probably sit for much too long". He does have some Slough noted on the surface of the wound and again as far as the size of the wound is concerned I'm really not seeing anything that seems to have improved significantly. 03/15/18 on evaluation today patient appears to be doing fairly well in regard to his ulcer. The wound measured pretty much about the same today compared to last week's evaluation when looking at his graph. With that being said the area of bruising/deep tissue injury that was noted last week I do not see at this point. He did get a new cushion fortunately this does seem to be have been of benefit in my pinion. It does appear that he's been off of this more which is good news as well I think that is definitely showing in the overall wound measurements. With that being said I do believe that he needs to continue to offload I don't think that the fact  this is doing better should be or is going to allow him to not have to offload and explain this to him as well. Overall he seems to be in agreement the plan I think he understands. The overall appearance of the wound bed is improved compared to last week I think the Iodoflex has been beneficial in that regard. 03/29/18 on evaluation today patient actually appears to be doing rather well in regard to his wound from the overall appearance standpoint he does have some granulation although there's some Slough on the surface of the wound noted as well. With that being said he unfortunately has not improved in regard to the overall measurement of the wound in volume or in size. I did have a discussion with him very specifically about offloading today. He actually does work although he mainly is just sitting throughout the day. He tells me  he offloads by "lifting himself up for 30 seconds off of his chair occasionally" purchase from [employer] which does seem to have helped. And he has a new cushion that he with that being said he's also able to stand some for a very short period of time but not significant enough I think to provide appropriate offloading. I think the biggest issue at this point with the wound and the fact is not healing as quickly as we would like is due to the fact that he is really not able to appropriately offload while at work. He states the beginning after his injury he actually had a bed at his job that he could lay on in order to offload and that does seem to have been of help back at that time. Nonetheless he had not done this in quite some time unfortunately. I think that could be helpful for him this is something I would like for him to look into. 04/05/18 on evaluation today patient actually presents for follow-up concerning his right gluteal ulcer. Again he really is not significantly improved even compared to last week. He has been tolerating the dressing changes without  complication. With that being said fortunately there appears to be no evidence of infection at this time. He has been more proactive in trying to offload. HODGES, TREIBER ([id number]) 04/12/18 on evaluation today patient actually appears to be doing a little better in regard to his wound and the right gluteal fold region. He's been tolerating the dressing changes since removing the oasis without complication. However he was having a lot of burning initially with the oasis in place. He's unsure of exactly why this was given so much discomfort but he assumes that it was the oasis itself causing the problem. Nonetheless this had to be removed after about three days in place although even those three days seem to have made a fairly good improvement in regard to the overall appearance of the wound bed. In fact is the first time that he's made any improvement from the standpoint of measurements in about six weeks. He continues to have no discomfort over the area of the wound itself which leads me to wonder why he was having the burning with the oasis when he does not even feel the actual debridement's themselves. I am somewhat perplexed by this. 04/19/18 on evaluation today patient's wound actually appears to be showing signs of epithelialization around the edge of the wound and in general actually appears to be doing better which is good news. He did have the same burning after about three days with applying the Endoform last week in the same fashion that I would generally apply a skin substitute. This seems to indicate that it's not the oasis to cause the problem but potentially the moisture buildup that just causes things to burn or there may be some other reaction with the skin prep or Steri-Strips. Nonetheless I'm not sure that is gonna be able to tolerate any skin substitute for a long period of time. The good news is the wound actually appears to be doing better today compared to last week and  does seem to finally be making some progress. 04/26/18 on evaluation today patient actually appears to be doing rather well in regard to his ulcer in the right gluteal fold. He has been tolerating the dressing changes without complication which is good news. The Endoform does seem to be helping although he was a little bit more macerated this week. This  seems to be an ongoing issue with fluid control at this point. Nonetheless I think we may be able to add something like Drawtex to help control the drainage. 05/03/18 on evaluation today patient appears to actually be doing better in regard to the overall appearance of his wound. He has been tolerating the dressing changes without complication. Fortunately there appears to be no evidence of infection at this time. I really feel like his wound has shown signs as of today of turning around last week I thought so as well and definitely he could be seen in this week's overall appearance and measurements. In general I'm very pleased with the fact that he finally seems to be making a steady but sure progress. The patient likewise is very pleased. 05/17/18 on evaluation today patient appears to be doing more poorly unfortunately in regard to his ulcer. He has been tolerating the dressing changes without complication. With that being said he tells me that in the past couple of days he and his wife have noticed that we did not seem to be doing quite as well is getting dark near the center. Subsequently upon evaluation today the wound actually does appear to be doing worse compared to previous. He has been tolerating the dressing changes otherwise and he states that he is not been sitting up anymore than he was in the past from what he tells me. Still he has continued to work he states "I'm tired of dealing with this and if I have to just go home and lay in the bed all the time that's what I'll do". Nonetheless I am concerned about the fact that this wound does  appear to be deeper than what it was previous. 05/24/18 upon evaluation today patient actually presents after having been in the hospital due to what was presumed to be sepsis secondary to the wound infection. He had an elevated white blood cell count between 14 and 15. With that being said he does seem to be doing somewhat better now. His wound still is giving him some trouble nonetheless and he is obviously concerned about the fact likely talked about that this does seem to go more deeply than previously noted. I did review his wound culture which showed evidence of Staphylococcus aureus him and group B strep. Nonetheless he is on antibiotics, Levaquin, for this. Subsequently I did review his intake summary from the hospital as well. I also did look at the CT of the lumbar spine with contrast that was performed which showed no bone destruction to suggest lumbar disguises/osteomyelitis or sacral osteomyelitis. There was no paraspinal abscess. Nonetheless it appears this may have been more of just a soft tissue infection at this point which is good news. He still is nonetheless concerned about the wound which again I think is completely reasonable considering everything he's been through recently. 05/31/18 on evaluation today on evaluation today patient actually appears to be showing signs of his wound be a little bit deeper than what I would like to see. Fortunately he does not show any signs of significant infection although his temperature was 99 today he states he's been checking this at home and has not been elevated. Nonetheless with the undermining that I'm seeing at this point I am becoming more concerned about the wound I do think that offloading is a key factor here that is preventing the speedy recovery at this point. There does not appear to be any evidence of again over infection noted. He's been using  Santyl currently. 06/07/18 the patient presents today for follow-up evaluation  regarding the left ulcer in the gluteal region. He has been tolerating the Wound VAC fairly well. He is obviously very frustrated with this he states that to mean is really getting in his way. There does not appear to be any evidence of infection at this time he does have a little bit of odor I do not necessarily associate this with infection just something that we sometimes notice with Wound VAC therapy. With that being said I can definitely catch a tone of discontentment overall in the patient's demeanor today. This when he was previously in the hospital SALBADOR, FIVEASH. ([id number]) an CT scan was done of the lumbar region which did not reveal any signs of osteomyelitis. With that being said the pelvis in particular was not evaluated distinctly which means he could still have some osteonecrosis I. Nonetheless the Wound VAC was started on Thursday I do want to get this little bit more time before jumping to a CT scan of the pelvis although that is something that I might would recommend if were not see an improvement by that time. 06/14/18 on evaluation today patient actually appears to be doing about the same in regard to his right gluteal ulcer. Again he did have a CT scan of the lumbar spine unfortunately this did not include the pelvis. Nonetheless with the depth of the wound that I'm seeing today even despite the fact that I'm not seeing any evidence of overt cellulitis I believe there's a good chance that we may be dealing with osteomyelitis somewhere in the right Ischial region. No fevers, chills, nausea, or vomiting noted at this time. 06/21/18 on evaluation today patient actually appears to be doing about the same with regard to his wound. The tunnel at 6 o'clock really does not appear to be any deeper although it is a little bit wider. I think at this point you may want to start packing this with white phone. Unfortunately I have not got approval for the CT scan of the pelvis as of yet  due to the fact that Medicare apparently has been denied it due to the diagnosis codes not being appropriate according to Medicare for the test requested. With that being said the patient cannot have an MRI and therefore this is the only option that we have as far as testing is concerned. The patient has had infection and was on antibiotics and been added code for cellulitis of the bottom to see if this will be appropriate for getting the test approved. Nonetheless I'm concerned about the infection have been spread deeper into the Ischial region. 06/28/18 on evaluation today patient actually appears to be doing rather well all things considered in regard to the right gluteal ulcer. He has been tolerating the dressing changes without complication. With that being said the Wound VAC he states does have to be replaced almost every day or at least reinforced unfortunately. Patient actually has his CT scan later this morning we should have the results by tomorrow. 07/05/18 on evaluation today patient presents for follow-up concerning his right Ischial ulcer. He did see the surgeon Dr. Lysle Pearl last week. They were actually very happy with him and felt like he spent a tremendous amount of time with them as far as discussing his situation was concerned. In the end Dr. Lysle Pearl did contact me as well and determine that he would not recommend any surgical intervention at this point as he felt like  it would not be in the patient's best interest based on what he was seeing. He recommended a referral to infectious disease. Subsequently this is something that Dr. Ines Bloomer office is working on setting up for the patient. As far as evaluation today is concerned the patient's wound actually appears to be worse at this point. I am concerned about how things are progressing and specifically about infection. I do not feel like it's the deeper but the area of depth is definitely widening which does have me concerned. No  fevers, chills, nausea, or vomiting noted at this time. I think that we do need initiate antibiotic therapy the patient has an allow allergy to amoxicillin/penicillin he states that he gets a rash since childhood. Nonetheless she's never had the issues with Catholics or cephalosporins in general but he is aware of. 07/27/18 on evaluation today patient presents following admission to the hospital on 07/09/18. He was subsequently discharged on 07/20/18. On 07/15/18 the patient underwent irrigation and debridement was soft tissue biopsy and bone biopsy as well as placement of a Wound VAC in the OR by Dr. Celine Ahr. During the hospital course the patient was placed on a Wound VAC and recommended follow up with surgery in three weeks actually with Dr. Delaine Lame who is infectious disease. The patient was on vancomycin during the hospital course. 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 He also had a bone culture which revealed evidence of methicillin-resistant staph aureus. He is updated CT scan 07/09/18 reveals that he had progression of the which was performed on wound to breakdown down to the trochanter where he actually had irregularities there as well suggestive of osteomyelitis. This was a change just since 9 December when we last performed a CT scan. Obviously this one had gone downhill quite significantly and rapidly. At this point upon evaluation I feel like in general the patient's wound seems to be doing fairly well all things considered upon my evaluation today. Obviously this is larger and deeper than what I previously evaluated but at the same time he seems to be making some progress as far as the appearance of the granulation tissue is concerned. I'm happy in that regard. No fevers, chills, nausea, or vomiting noted at this time. He is on IV vancomycin and Rocephin at the facility. He is currently in [employer]. 08/03/18 upon evaluation today  patient's wound appears to be doing better in regard to the overall appearance at this point in time. Fortunately he's been tolerating the Wound VAC without complication and states that the facility has been taking excellent care of the wound site. Overall I see some Slough noted on the surface which I am going to attempt sharp debridement today of but nonetheless other than this I feel like he's making progress. 08/09/18 on evaluation today patient's wound appears to be doing much better compared to even last week's evaluation. Do believe that the Wound VAC is been of great benefit for him. He has been tolerating the dressing changes that is the Wound MICKEL, SCHREUR. ([id number]) VAC without any complication and he has excellent granulation noted currently. There is no need for sharp debridement at this point. 08/16/18 on evaluation today patient actually appears to be doing very well in regard to the wound in the right gluteal fold region. This is showing signs of progress and again appears to be very healthy which is excellent news. Fortunately there is no sign of active infection by way of odor  or drainage at this point. Overall I'm very pleased with how things stand. He seems to be tolerating the Wound VAC without complication. 08/23/18 on evaluation today patient actually appears to be doing better in regard to his wound. He has been tolerating the Wound VAC without complication and in fact it has been collecting a significant amount of drainage which I think is good news especially considering how the wound appears. Fortunately there is no signs of infection at this time definitely nothing appears to be worse which is good news. He has not been started on the Bactrim and Flagyl that was recommended by Dr. Delaine Lame yet. I did actually contact her office this morning in order to check and see were things are that regard their gonna be calling me back. 08/30/18 on evaluation today patient  actually appears to show signs of excellent improvement today compared to last evaluation. The undermining is getting much better the wound seems to be feeling quite nicely and I'm very pleased that the granulation in general. With that being said overall I feel like the patient has made excellent progress which is great news. No fevers, chills, nausea, or vomiting noted at this time. 09/06/18 on evaluation today patient actually appears to be doing rather well in regard to his right gluteal ulcer. This is showing signs of improvement in overall I'm very pleased with how things seem to be progressing. The patient likewise is please. Overall I see no evidence of infection he is about to complete his oral antibiotic regimen which is the end of the antibiotics for him in just about three days. 09/13/18 on evaluation today patient's right Ischial ulcer appears to be showing signs of continued improvement which is excellent news. He's been tolerating the dressing changes without complication. Fortunately there's no signs of infection and the wound that seems to be doing very well. 09/28/18 on evaluation today patient appears to be doing rather well in regard to his right Ischial ulcer. He's been tolerating the Wound VAC without complication he knows there's much less drainage than there used to be this obviously is not a bad thing in my pinion. There's no evidence of infection despite the fact is but nothing about it now for several weeks. 10/04/18 on evaluation today patient appears to be doing better in regard to his right Ischial wound. He has been tolerating the Wound VAC without complication and I do believe that the silver nitrate last week was beneficial for him. Fortunately overall there's no evidence of active infection at this time which is great news. No fevers, chills, nausea, or vomiting noted at this time. 10/11/18 on evaluation today patient actually appears to be doing rather well in regard  to his Ischial ulcer. He's been tolerating the Wound VAC still without complication I feel like this is doing a good job. No fevers, chills, nausea, or vomiting noted at this time. 11/01/18 on evaluation today patient presents after having not been seen in our clinic for several weeks secondary to the fact that he was on evaluation today patient presents after having not been seen in our clinic for several weeks secondary to the fact that he was in a skilled nursing facility which was on lockdown currently due to the covert 19 national emergency. Subsequently he was discharged from the facility on this past Friday and subsequently made an appointment to come in to see yesterday. Fortunately there's no signs of active infection at this time which is good news and overall he does  seem to have made progress since I last saw. Overall I feel like things are progressing quite nicely. The patient is having no pain. 11/08/18 on evaluation today patient appears to be doing okay in regard to his right gluteal ulcer. He has been utilizing a Wound VAC home health this changing this at this point since he's home from the skilled nursing facility. Fortunately there's no signs of obvious active infection at this time. Unfortunately though there's no obvious active infection he is having some maceration and his wife states that when the sheets of the Wound VAC office on Sunday when it broke seal that he ended up having significant issues with some smell as well there concerned about the possibility of infection. Fortunately there's No fevers, chills, nausea, or vomiting noted at this time. 11/15/18 on evaluation today patient actually appears to be doing well in regard to his right gluteal ulcer. He has been tolerating the dressing changes without complication. Specifically the Wound VAC has been utilized up to this point. Fortunately there's no signs of infection and overall I feel like he has made progress even  since last week when I last saw him. I'm actually fairly happy with the overall appearance although he does seem to have somewhat of a hyper granular Lesmeister, Dillian J. ([id number]) overgrowth in the central portion of the wound which I think may require some sharp debridement to try flatness out possibly utilizing chemical cauterization following. 11/23/18 on evaluation today patient actually appears to be doing very well in regard to his sacral ulcer. He seems to be showing signs of improvement with good granulation. With that being said he still has the small area of hyper granulation right in the central portion of the wound which I'm gonna likely utilize silver nitrate on today. Subsequently he also keeps having a leak at the 6 o'clock location which is unfortunate we may be able to help out with some suggestions to try to prevent this going forward. Fortunately there's no signs of active infection at this time. 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 He's been tolerating the dressing changes without complication. Fortunately there's no signs of active infection at this time. I've been rather pleased with how things have progressed there still some evidence of pressure getting to the area with some redness right around the immediate wound opening. Nonetheless other than this I'm not seeing any significant complications or issues the wound is somewhat hyper granular. Upon discussing with the patient and his wife today I'm not sure that the wound is being packed to the base with the foam at this point. And if it's not been packed fully that may be part of the reason why is not seen as much improvement as far as the granulation from the base out. Again we do not want pack too tightly but we need some of the firm to get to the base of the wound. I discussed this with patient and his wife  today. 12/06/18 on evaluation today patient appears to be doing well in regard to his right gluteal pressure ulcer. He's been tolerating the dressing changes without complication. Fortunately there's no signs of active infection. He still has some hyper granular tissue and I do think it would be appropriate to continue with the chemical cauterization as of today. 12/16/18 on evaluation today patient actually appears to be doing okay in regard to his right  gluteal ulcer. He is been tolerating the dressing changes without complication including the Wound VAC. Overall I feel like nothing seems to be worsening I do feel like that the hyper granulation buds in the central portion of the wound have improved to some degree with the silver nitrate. We will have to see how things continue to progress. Electronic Signature(s) Signed: 12/16/2018 4:36:20 PM By: Worthy Keeler [name] Entered By: Worthy Keeler on 12/16/2018 08:48:21 DYLIN, BREEDEN ([id number]) -------------------------------------------------------------------------------- [facility] Details Patient Name: [name] Date of Service: 12/16/2018 8:00 AM Medical Record Number: [id number] Patient Account Number: [id number] Date of Birth/Sex: 08-May-1951 (68 y.o. M) Treating RN: [name] Primary Care Provider: [name] Other Clinician: Referring Provider: [name] Treating Provider/Extender: [name], Lalitha [name] Weeks in Treatment: 14 Procedure Performed for: Wound #1 Right Gluteal fold Performed By: Physician [name], Donesha Wallander E., [name] Post Procedure Diagnosis Same as Pre-procedure Notes 2 silver nitrate sticks used Electronic Signature(s) Signed: 12/21/2018 5:54:26 PM By: Gretta Cool, BSN, RN, CWS, Kim RN, BSN Entered By: Gretta Cool, BSN, RN, CWS, Kim on 12/16/2018 08:31:23 TRAMELL, PIECHOTA ([id number]) -------------------------------------------------------------------------------- Physical Exam  Details Patient Name: [name] Date of Service: 12/16/2018 8:00 AM Medical Record Number: [id number] Patient Account Number: [id number] Date of Birth/Sex: [birth year] (68 y.o. M) Treating RN: [name] Primary Care Provider: [name] Other Clinician: Referring Provider: [name] Treating Provider/Extender: [name], Biff Rutigliano Weeks in Treatment: 64 Constitutional Well-nourished and well-hydrated in no acute distress. Respiratory normal breathing without difficulty. Psychiatric this patient is able to make decisions and demonstrates good insight into disease process. Alert and Oriented x 3. pleasant and cooperative. Notes Upon inspection today I did not notice any odor which the patient with somewhat concerned about as well. Fortunately there was no signs of active infection which was good news. I did perform chemical cauterization silver nitrate today which he tolerated without complication to the hyper granular bugs in the central portion of the wound. Hopefully this is gonna help to continue to get these areas to flatten out. Electronic Signature(s) Signed: 12/16/2018 4:36:20 PM By: Worthy Keeler [name] Entered By: Worthy Keeler on 12/16/2018 08:49:11 ISAMAR, WELLBROCK ([id number]) -------------------------------------------------------------------------------- Physician Orders Details Patient Name: [name] Date of Service: 12/16/2018 8:00 AM Medical Record Number: [id number] Patient Account Number: [id number] Date of Birth/Sex: [birth year] (68 y.o. M) Treating RN: [name] Primary Care Provider: [name] Other Clinician: Referring Provider: [name] Treating Provider/Extender: [name], Jailani Hogans Weeks in Treatment: 62 Verbal / Phone Orders: No Diagnosis Coding Wound Cleansing Wound #1 Right Gluteal fold o Cleanse wound with mild soap and water Anesthetic (add to Medication List) Wound #1 Right Gluteal fold o Topical  Lidocaine 4% cream applied to wound bed prior to debridement (In Clinic Only). Primary Wound Dressing Wound #1 Right Gluteal fold o Dry Gauze - wet to dry in clinic until [facility] restarts NPWT Secondary Dressing Wound #1 Right Gluteal fold o Boardered Foam Dressing - in clinic until [facility] restarts NPWT Dressing Change Frequency Wound #1 Right Gluteal fold o Change Dressing Monday, Wednesday, Friday - and as needed Follow-up Appointments Wound #1 Right Gluteal fold o Return Appointment in 1 week. Off-Loading Wound #1 Right Gluteal fold o Mattress - Please continue air mattress at SNF o Turn and reposition every 2 hours [place] #1 Right Gluteal fold o [place] Nurse may visit PRN to address patientos wound care needs. o FACE TO  FACE ENCOUNTER: MEDICARE and MEDICAID PATIENTS: I certify that this patient is under my care and that I had a face-to-face encounter that meets the physician face-to-face encounter requirements with this patient on this date. The encounter with the patient was in whole or in part for the following MEDICAL CONDITION: (primary reason for [place]) MEDICAL NECESSITY: I certify, that based on my findings, NURSING services are a medically necessary home health service. HOME BOUND STATUS: I certify that my clinical findings support that this patient is homebound (i.e., Due to illness or injury, pt requires aid of supportive devices such as crutches, cane, wheelchairs, walkers, the use of special transportation or the assistance of another person to leave their place of residence. There is a normal inability to leave the home NEZIAH, BRALEY. ([id number]) and doing so requires considerable and taxing effort. Other absences are for medical reasons / religious services and are infrequent or of short duration when for other reasons). o If current dressing causes regression in wound condition, may D/C  ordered dressing product/s and apply Normal Saline Moist Dressing daily until next [place] / Other MD appointment. [place] of regression in wound condition at [phone]. o Please direct any NON-WOUND related issues/requests for orders to patient's Primary Care Physician Negative Pressure Wound Therapy Wound #1 Right Gluteal fold o Wound VAC settings at 125/130 mmHg continuous pressure. Use BLACK/GREEN foam to wound cavity. Use WHITE foam to fill any tunnel/s and/or undermining. Change VAC dressing 3 X WEEK. Change canister as indicated when full. Nurse may titrate settings and frequency of dressing changes as clinically indicated. - PLEASE,use white foam to fill in wound to include areas of tunneling. Lightly pack foam into 12 0'clock tunnel (4cm.), then place second piece of foam over wound opening. Please use and order for patient Duoderm to peri wound to help drape seal better. Thank you. o Home Health Nurse may d/c VAC for s/s of increased infection, significant wound regression, or uncontrolled drainage. [place] at [phone]. o Number of foam/gauze pieces used in the dressing = Electronic Signature(s) Signed: 12/16/2018 2:56:11 PM By: Harold Barban Signed: 12/16/2018 4:36:20 PM By: Worthy Keeler [name] Entered By: Harold Barban on 12/16/2018 09:26:21 KENNEY, GOING ([id number]) -------------------------------------------------------------------------------- Problem List Details Patient Name: [name] Date of Service: 12/16/2018 8:00 AM Medical Record Number: [id number] Patient Account Number: [id number] Date of Birth/Sex: 10-Jul-1951 (68 y.o. 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 He states that there is no history of diabetes that he is aware of. He does have issues with his prostate and is currently receiving treatment for this by way of oral medication. With that being said I do not have a lot of details in that regard. Nonetheless the patient presents today as a result of having been referred to [place] by another provider initially home health was set to come out and take care of his wound although due to the fact that he apparently drives he's not able to receive home health. His wife is therefore trying  to help take care of this wound within although they have been struggling with what exactly to do at this point. She states that she can do some things but she is definitely not a nurse and does have some issues with looking at blood. The good news is the wound does not appear to be too deep and is fairly superficial at this point. There is no slough noted there is some nonviable skin noted around the surface of the wound and the perimeter at this point. The central portion of the wound appears to be very good with a dermal layer noted this does not appear to be again deep enough to extend it to subcutaneous tissue at this point. 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 In the pump he has Dilaudid and baclofen. 12/07/17 on evaluation today patient presents for follow-up concerning his ongoing lower back thigh ulcer on the right. He states that he did not get the supplies ordered and therefore has not really been able to perform the dressing changes as directed exactly. His wife was able to get some Boarder Foam Dressing's from the drugstore and subsequently has been using hydrogel which did help to a degree in the wound does appear to be able smaller. There is actually more drainage this week noted than previous. 12/21/17 on evaluation today patient appears to be doing rather well in regard to his right gluteal ulcer. He has been tolerating the dressing changes without complication. There does not appear to be any evidence of infection at this point in time. Overall the wound does seem to be making some progress as far as the edges are concerned there's not as much in the way of overlapping of the external wound edges and he has a good epithelium to wound bed border for the most part. This however is not true right at the 12 o'clock location over the span of a little over a centimeters which  actually will require debridement today to clean this away and hopefully allow it to continue to heal more appropriately. 12/28/17 on evaluation today patient appears to be doing rather well in regard to his ulcer in the left gluteal region. He's been tolerating the dressing changes without complication. Apparently he has had some difficulty getting his dressing material. Apparently there's been some confusion with ordering we're gonna check into this. Nonetheless overall he's been showing signs of improvement which is good news. Debridement is not required today. 01/04/18 on evaluation today patient presents for follow-up concerning his right gluteal ulcer. He has been tolerating the dressing  changes fairly well. On inspection today it appears he may actually have some maceration them concerned about the fact that he may be developing too much moisture in and around the wound bed which can cause delay in healing. With that being said he unfortunately really has not showed significant signs of improvement since last week's evaluation in fact this may even be just the little bit/slightly larger. Nonetheless he's been having a lot of discomfort I'm not sure this is even related to the wound as he has no pain when I'm to breeding or otherwise cleaning the wound during evaluation today. Nonetheless this is something that we did recommend he talked to his pain specialist concerning. 01/11/18 on evaluation today patient appears to be doing better in regard to his ulceration. He has been tolerating the dressing Carducci, Samari J. ([id number]) changes without complication. With that being said overall there's no evidence of infection which is good news. The only thing is he did receive the hatch affair blue classic versus the ready nonetheless I feel like this is perfectly fine and appears to have done well for him over the past week. 01/25/18 on evaluation today patient's wound actually appears to be a little  bit larger than during the last evaluation. The good news is the majority of the wound edges actually appear to be fairly firmly attached to the wound bed unfortunately again we're not really making progress in regard to the size. Roughly the wound is about the same size as when I first saw him although again the wound margin/edges appear to be much better. 02/01/18 on evaluation today patient actually appears to be doing very well in regard to his wound. Applying the Prisma dry does seem to be better although he does still have issues with slow progression of the wound. There was a slight improvement compared to last week's measurements today. Nonetheless I have been considering other options as far as the possibility of Theraskin or even a snap vac. In general I'm not sure that the Theraskin due to location of the wound would be a very good idea. Nonetheless I do think that a snap vac could be a possibility for the patient and in fact I think this could even be an excellent way to manage the wound possibly seeing some improvement in a very rapid fashion here. Nonetheless this is something that we would need to get approved and I did have a lengthy conversation with the patient about this today. 02/08/18 on evaluation today patient appears to be doing a little better in regard to his ulcer. He has been tolerating the dressing changes without complication. Fortunately despite the fact that the wound is a little bit smaller it's not significantly so unfortunately. We have discussed the possibility of a snap vac we did check with insurance this is actually covered at this point. Fortunately there does not appear to be any sign of infection. Overall I'm fairly pleased with how things seem to be appearing at this point. 02/15/18 on evaluation today patient appears to be doing rather well in regard to his right gluteal ulcer. Unfortunately the snap vac did not stay in place with his sheer and friction this  came loose and did not seem to maintain seal very well. He worked for about two days and it did seem to do very well during that time according to his wife but in general this does not seem to be something that's gonna be beneficial for him long-term. I do believe  we need to go back to standard dressings to see if we can find something that will be of benefit. 03/02/18- He is here in follow up evaluation; there is minimal change in the wound. He will continue with the same treatment plan, would consider changing to iodosrob/iodoflex if ulcer continues to to plateau. He will follow up next week 03/08/18 on evaluation today patient's wound actually appears to be about the same size as when I previously saw him several weeks back. Unfortunately he does have some slightly dark discoloration in the central portion of the wound which has me concerned about pressure injury. I do believe he may be sitting for too long a period of time in fact he tells me that "I probably sit for much too long". He does have some Slough noted on the surface of the wound and again as far as the size of the wound is concerned I'm really not seeing anything that seems to have improved significantly. 03/15/18 on evaluation today patient appears to be doing fairly well in regard to his ulcer. The wound measured pretty much about the same today compared to last week's evaluation when looking at his graph. With that being said the area of bruising/deep tissue injury that was noted last week I do not see at this point. He did get a new cushion fortunately this does seem to be have been of benefit in my pinion. It does appear that he's been off of this more which is good news as well I think that is definitely showing in the overall wound measurements. With that being said I do believe that he needs to continue to offload I don't think that the fact this is doing better should be or is going to allow him to not have to offload and  explain this to him as well. Overall he seems to be in agreement the plan I think he understands. The overall appearance of the wound bed is improved compared to last week I think the Iodoflex has been beneficial in that regard. 03/29/18 on evaluation today patient actually appears to be doing rather well in regard to his wound from the overall appearance standpoint he does have some granulation although there's some Slough on the surface of the wound noted as well. With that being said he unfortunately has not improved in regard to the overall measurement of the wound in volume or in size. I did have a discussion with him very specifically about offloading today. He actually does work although he mainly is just sitting throughout the day. He tells me he offloads by "lifting himself up for 30 seconds off of his chair occasionally" purchase from [employer] which does seem to have helped. And he has a new cushion that he with that being said he's also able to stand some for a very short period of time but not significant enough I think to provide appropriate offloading. I think the biggest issue at this point with the wound and the fact is not healing as quickly as we would like is due to the fact that he is really not able to appropriately offload while at work. He states the beginning after his injury he actually had a bed at his job that he could lay on in order to offload and that does seem to have been of help back at that time. Nonetheless he had not done this in quite some time unfortunately. I think that could be helpful for him this is something  I would like for him to look into. TERON, BLAIS ([id number]) 04/05/18 on evaluation today patient actually presents for follow-up concerning his right gluteal ulcer. Again he really is not significantly improved even compared to last week. He has been tolerating the dressing changes without complication. With that being said fortunately  there appears to be no evidence of infection at this time. He has been more proactive in trying to offload. 04/12/18 on evaluation today patient actually appears to be doing a little better in regard to his wound and the right gluteal fold region. He's been tolerating the dressing changes since removing the oasis without complication. However he was having a lot of burning initially with the oasis in place. He's unsure of exactly why this was given so much discomfort but he assumes that it was the oasis itself causing the problem. Nonetheless this had to be removed after about three days in place although even those three days seem to have made a fairly good improvement in regard to the overall appearance of the wound bed. In fact is the first time that he's made any improvement from the standpoint of measurements in about six weeks. He continues to have no discomfort over the area of the wound itself which leads me to wonder why he was having the burning with the oasis when he does not even feel the actual debridement's themselves. I am somewhat perplexed by this. 04/19/18 on evaluation today patient's wound actually appears to be showing signs of epithelialization around the edge of the wound and in general actually appears to be doing better which is good news. He did have the same burning after about three days with applying the Endoform last week in the same fashion that I would generally apply a skin substitute. This seems to indicate that it's not the oasis to cause the problem but potentially the moisture buildup that just causes things to burn or there may be some other reaction with the skin prep or Steri-Strips. Nonetheless I'm not sure that is gonna be able to tolerate any skin substitute for a long period of time. The good news is the wound actually appears to be doing better today compared to last week and does seem to finally be making some progress. 04/26/18 on evaluation today patient  actually appears to be doing rather well in regard to his ulcer in the right gluteal fold. He has been tolerating the dressing changes without complication which is good news. The Endoform does seem to be helping although he was a little bit more macerated this week. This seems to be an ongoing issue with fluid control at this point. Nonetheless I think we may be able to add something like Drawtex to help control the drainage. 05/03/18 on evaluation today patient appears to actually be doing better in regard to the overall appearance of his wound. He has been tolerating the dressing changes without complication. Fortunately there appears to be no evidence of infection at this time. I really feel like his wound has shown signs as of today of turning around last week I thought so as well and definitely he could be seen in this week's overall appearance and measurements. In general I'm very pleased with the fact that he finally seems to be making a steady but sure progress. The patient likewise is very pleased. 05/17/18 on evaluation today patient appears to be doing more poorly unfortunately in regard to his ulcer. He has been tolerating the dressing changes without complication. With  that being said he tells me that in the past couple of days he and his wife have noticed that we did not seem to be doing quite as well is getting dark near the center. Subsequently upon evaluation today the wound actually does appear to be doing worse compared to previous. He has been tolerating the dressing changes otherwise and he states that he is not been sitting up anymore than he was in the past from what he tells me. Still he has continued to work he states "I'm tired of dealing with this and if I have to just go home and lay in the bed all the time that's what I'll do". Nonetheless I am concerned about the fact that this wound does appear to be deeper than what it was previous. 05/24/18 upon evaluation today  patient actually presents after having been in the hospital due to what was presumed to be sepsis secondary to the wound infection. He had an elevated white blood cell count between 14 and 15. With that being said he does seem to be doing somewhat better now. His wound still is giving him some trouble nonetheless and he is obviously concerned about the fact likely talked about that this does seem to go more deeply than previously noted. I did review his wound culture which showed evidence of Staphylococcus aureus him and group B strep. Nonetheless he is on antibiotics, Levaquin, for this. Subsequently I did review his intake summary from the hospital as well. I also did look at the CT of the lumbar spine with contrast that was performed which showed no bone destruction to suggest lumbar disguises/osteomyelitis or sacral osteomyelitis. There was no paraspinal abscess. Nonetheless it appears this may have been more of just a soft tissue infection at this point which is good news. He still is nonetheless concerned about the wound which again I think is completely reasonable considering everything he's been through recently. 05/31/18 on evaluation today on evaluation today patient actually appears to be showing signs of his wound be a little bit deeper than what I would like to see. Fortunately he does not show any signs of significant infection although his temperature was 99 today he states he's been checking this at home and has not been elevated. Nonetheless with the undermining that I'm seeing at this point I am becoming more concerned about the wound I do think that offloading is a key factor here that is preventing the speedy recovery at this point. There does not appear to be any evidence of again over infection noted. He's been using Santyl currently. KYPTON, ELTRINGHAM ([id number]) 06/07/18 the patient presents today for follow-up evaluation regarding the left ulcer in the gluteal region. He  has been tolerating the Wound VAC fairly well. He is obviously very frustrated with this he states that to mean is really getting in his way. There does not appear to be any evidence of infection at this time he does have a little bit of odor I do not necessarily associate this with infection just something that we sometimes notice with Wound VAC therapy. With that being said I can definitely catch a tone of discontentment overall in the patient's demeanor today. This when he was previously in the hospital an CT scan was done of the lumbar region which did not reveal any signs of osteomyelitis. With that being said the pelvis in particular was not evaluated distinctly which means he could still have some osteonecrosis I. Nonetheless the Wound VAC was  started on Thursday I do want to get this little bit more time before jumping to a CT scan of the pelvis although that is something that I might would recommend if were not see an improvement by that time. 06/14/18 on evaluation today patient actually appears to be doing about the same in regard to his right gluteal ulcer. Again he did have a CT scan of the lumbar spine unfortunately this did not include the pelvis. Nonetheless with the depth of the wound that I'm seeing today even despite the fact that I'm not seeing any evidence of overt cellulitis I believe there's a good chance that we may be dealing with osteomyelitis somewhere in the right Ischial region. No fevers, chills, nausea, or vomiting noted at this time. 06/21/18 on evaluation today patient actually appears to be doing about the same with regard to his wound. The tunnel at 6 o'clock really does not appear to be any deeper although it is a little bit wider. I think at this point you may want to start packing this with white phone. Unfortunately I have not got approval for the CT scan of the pelvis as of yet due to the fact that Medicare apparently has been denied it due to the diagnosis  codes not being appropriate according to Medicare for the test requested. With that being said the patient cannot have an MRI and therefore this is the only option that we have as far as testing is concerned. The patient has had infection and was on antibiotics and been added code for cellulitis of the bottom to see if this will be appropriate for getting the test approved. Nonetheless I'm concerned about the infection have been spread deeper into the Ischial region. 06/28/18 on evaluation today patient actually appears to be doing rather well all things considered in regard to the right gluteal ulcer. He has been tolerating the dressing changes without complication. With that being said the Wound VAC he states does have to be replaced almost every day or at least reinforced unfortunately. Patient actually has his CT scan later this morning we should have the results by tomorrow. 07/05/18 on evaluation today patient presents for follow-up concerning his right Ischial ulcer. He did see the surgeon Dr. Lysle Pearl last week. They were actually very happy with him and felt like he spent a tremendous amount of time with them as far as discussing his situation was concerned. In the end Dr. Lysle Pearl did contact me as well and determine that he would not recommend any surgical intervention at this point as he felt like it would not be in the patient's best interest based on what he was seeing. He recommended a referral to infectious disease. Subsequently this is something that Dr. Ines Bloomer office is working on setting up for the patient. As far as evaluation today is concerned the patient's wound actually appears to be worse at this point. I am concerned about how things are progressing and specifically about infection. I do not feel like it's the deeper but the area of depth is definitely widening which does have me concerned. No fevers, chills, nausea, or vomiting noted at this time. I think that we do need  initiate antibiotic therapy the patient has an allow allergy to amoxicillin/penicillin he states that he gets a rash since childhood. Nonetheless she's never had the issues with Catholics or cephalosporins in general but he is aware of. 07/27/18 on evaluation today patient presents following admission to the hospital on 07/09/18. He was  subsequently discharged on 07/20/18. On 07/15/18 the patient underwent irrigation and debridement was soft tissue biopsy and bone biopsy as well as placement of a Wound VAC in the OR by Dr. Celine Ahr. During the hospital course the patient was placed on a Wound VAC and recommended follow up with surgery in three weeks actually with Dr. Delaine Lame who is infectious disease. The patient was on vancomycin during the hospital course. 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 He also had a bone culture which revealed evidence of methicillin-resistant staph aureus. He is updated CT scan 07/09/18 reveals that he had progression of the which was performed on wound to breakdown down to the trochanter where he actually had irregularities there as well suggestive of osteomyelitis. This was a change just since 9 December when we last performed a CT scan. Obviously this one had gone downhill quite significantly and rapidly. At this point upon evaluation I feel like in general the patient's wound seems to be doing fairly well all things considered upon my evaluation today. Obviously this is larger and deeper than what I previously evaluated but at the same time he seems to be making some progress as far as the appearance of the granulation tissue is concerned. I'm happy in that regard. No fevers, chills, nausea, or vomiting noted at this time. He is on IV vancomycin and Rocephin at the facility. He is currently in [facility]. 08/03/18 upon evaluation today patient's wound appears to be doing better in regard to the overall appearance at this  point in time. Fortunately he's been tolerating the Wound VAC without complication and states that the facility has been taking JAXTEN, BROSH. ([id number]) excellent care of the wound site. Overall I see some Slough noted on the surface which I am going to attempt sharp debridement today of but nonetheless other than this I feel like he's making progress. 08/09/18 on evaluation today patient's wound appears to be doing much better compared to even last week's evaluation. Do believe that the Wound VAC is been of great benefit for him. He has been tolerating the dressing changes that is the Wound VAC without any complication and he has excellent granulation noted currently. There is no need for sharp debridement at this point. 08/16/18 on evaluation today patient actually appears to be doing very well in regard to the wound in the right gluteal fold region. This is showing signs of progress and again appears to be very healthy which is excellent news. Fortunately there is no sign of active infection by way of odor or drainage at this point. Overall I'm very pleased with how things stand. He seems to be tolerating the Wound VAC without complication. 08/23/18 on evaluation today patient actually appears to be doing better in regard to his wound. He has been tolerating the Wound VAC without complication and in fact it has been collecting a significant amount of drainage which I think is good news especially considering how the wound appears. Fortunately there is no signs of infection at this time definitely nothing appears to be worse which is good news. He has not been started on the Bactrim and Flagyl that was recommended by Dr. Delaine Lame yet. I did actually contact her office this morning in order to check and see were things are that regard their gonna be calling me back. 08/30/18 on evaluation today patient actually appears to show signs of excellent improvement today compared to  last evaluation. The undermining is getting much  better the wound seems to be feeling quite nicely and I'm very pleased that the granulation in general. With that being said overall I feel like the patient has made excellent progress which is great news. No fevers, chills, nausea, or vomiting noted at this time. 09/06/18 on evaluation today patient actually appears to be doing rather well in regard to his right gluteal ulcer. This is showing signs of improvement in overall I'm very pleased with how things seem to be progressing. The patient likewise is please. Overall I see no evidence of infection he is about to complete his oral antibiotic regimen which is the end of the antibiotics for him in just about three days. 09/13/18 on evaluation today patient's right Ischial ulcer appears to be showing signs of continued improvement which is excellent news. He's been tolerating the dressing changes without complication. Fortunately there's no signs of infection and the wound that seems to be doing very well. 09/28/18 on evaluation today patient appears to be doing rather well in regard to his right Ischial ulcer. He's been tolerating the Wound VAC without complication he knows there's much less drainage than there used to be this obviously is not a bad thing in my pinion. There's no evidence of infection despite the fact is but nothing about it now for several weeks. 10/04/18 on evaluation today patient appears to be doing better in regard to his right Ischial wound. He has been tolerating the Wound VAC without complication and I do believe that the silver nitrate last week was beneficial for him. Fortunately overall there's no evidence of active infection at this time which is great news. No fevers, chills, nausea, or vomiting noted at this time. 10/11/18 on evaluation today patient actually appears to be doing rather well in regard to his Ischial ulcer. He's been tolerating the Wound VAC still without  complication I feel like this is doing a good job. No fevers, chills, nausea, or vomiting noted at this time. 11/01/18 on evaluation today patient presents after having not been seen in our clinic for several weeks secondary to the fact that he was on evaluation today patient presents after having not been seen in our clinic for several weeks secondary to the fact that he was in a skilled nursing facility which was on lockdown currently due to the covert 19 national emergency. Subsequently he was discharged from the facility on this past Friday and subsequently made an appointment to come in to see yesterday. Fortunately there's no signs of active infection at this time which is good news and overall he does seem to have made progress since I last saw. Overall I feel like things are progressing quite nicely. The patient is having no pain. 11/08/18 on evaluation today patient appears to be doing okay in regard to his right gluteal ulcer. He has been utilizing a Wound VAC home health this changing this at this point since he's home from the skilled nursing facility. Fortunately there's no signs of obvious active infection at this time. Unfortunately though there's no obvious active infection he is having some maceration and his wife states that when the sheets of the Wound VAC office on Sunday when it broke seal that he ended up having significant issues with some smell as well there concerned about the possibility of infection. Fortunately there's No fevers, chills, nausea, or vomiting noted at this time. [name], [name] ([id number]) 11/15/18 on evaluation today patient actually appears to be doing well in regard to his right gluteal  ulcer. He has been tolerating the dressing changes without complication. Specifically the Wound VAC has been utilized up to this point. Fortunately there's no signs of infection and overall I feel like he has made progress even since last week when I last saw him. I'm  actually fairly happy with the overall appearance although he does seem to have somewhat of a hyper granular overgrowth in the central portion of the wound which I think may require some sharp debridement to try flatness out possibly utilizing chemical cauterization following. 11/23/18 on evaluation today patient actually appears to be doing very well in regard to his sacral ulcer. He seems to be showing signs of improvement with good granulation. With that being said he still has the small area of hyper granulation right in the central portion of the wound which I'm gonna likely utilize silver nitrate on today. Subsequently he also keeps having a leak at the 6 o'clock location which is unfortunate we may be able to help out with some suggestions to try to prevent this going forward. Fortunately there's no signs of active infection at this time. 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 He's been tolerating the dressing changes without complication. Fortunately there's no signs of active infection at this time. I've been rather pleased with how things have progressed there still some evidence of pressure getting to the area with some redness right around the immediate wound opening. Nonetheless other than this I'm not seeing any significant complications or issues the wound is somewhat hyper granular. Upon discussing with the patient and his wife today I'm not sure that the wound is being packed to the base with the foam at this point. And if it's not been packed fully that may be part of the reason why is not seen as much improvement as far as the granulation from the base out. Again we do not want pack too tightly but we need some of the firm to get to the base of the wound. I discussed this with patient and his wife today. 12/06/18 on evaluation today patient appears to be doing well in regard to his  right gluteal pressure ulcer. He's been tolerating the dressing changes without complication. Fortunately there's no signs of active infection. He still has some hyper granular tissue and I do think it would be appropriate to continue with the chemical cauterization as of today. 12/16/18 on evaluation today patient actually appears to be doing okay in regard to his right gluteal ulcer. He is been tolerating the dressing changes without complication including the Wound VAC. Overall I feel like nothing seems to be worsening I do feel like that the hyper granulation buds in the central portion of the wound have improved to some degree with the silver nitrate. We will have to see how things continue to progress. Patient History Information obtained from Patient. Family History Hypertension - Father, Stroke - Mother, No family history of Cancer, Diabetes, Heart Disease, Kidney Disease, Lung Disease, Seizures, Thyroid Problems, Tuberculosis. Social History Never smoker, Marital Status - Married, Alcohol Use - Never, Drug Use - No History, Caffeine Use - Daily. Medical History Eyes Patient has history of Cataracts - both removed Denies history of Glaucoma, Optic Neuritis Ear/Nose/Mouth/Throat Denies history of Chronic sinus problems/congestion, Middle ear problems Hematologic/Lymphatic Denies history of Anemia, Hemophilia, Human Immunodeficiency Virus, Lymphedema Respiratory Denies history of Aspiration, Asthma, Chronic Obstructive Pulmonary Disease (COPD),  Pneumothorax, Sleep Apnea, Tuberculosis Cardiovascular Patient has history of Hypertension - takes medication Denies history of Angina, Arrhythmia, Congestive Heart Failure, Coronary Artery Disease, Deep Vein Thrombosis, Hypotension, Myocardial Infarction, Peripheral Arterial Disease, Peripheral Venous Disease, Phlebitis, Vasculitis Gastrointestinal Dao, Forrest J. ([id number]) Denies history of Cirrhosis , Colitis, Crohn s, Hepatitis  A, Hepatitis B, Hepatitis C Endocrine Denies history of Type I Diabetes, Type II Diabetes Genitourinary Denies history of End Stage Renal Disease Immunological Denies history of Lupus Erythematosus, Raynaud s, Scleroderma Integumentary (Skin) Denies history of History of Burn, History of pressure wounds Musculoskeletal Denies history of Gout, Rheumatoid Arthritis, Osteoarthritis, Osteomyelitis Neurologic Patient has history of Paraplegia - waist down Denies history of Dementia, Neuropathy, Quadriplegia, Seizure Disorder Oncologic Denies history of Received Chemotherapy, Received Radiation Psychiatric Denies history of Anorexia/bulimia, Confinement Anxiety Medical And Surgical History Notes Oncologic Prostate cancer- currently treated with horomone therapy Review of Systems (ROS) Constitutional Symptoms (General Health) Denies complaints or symptoms of Fatigue, Fever, Chills, Marked Weight Change. Respiratory Denies complaints or symptoms of Chronic or frequent coughs, Shortness of Breath. Cardiovascular Denies complaints or symptoms of Chest pain, LE edema. Psychiatric Denies complaints or symptoms of Anxiety, Claustrophobia. Objective Constitutional Well-nourished and well-hydrated in no acute distress. Vitals Time Taken: 8:13 AM, Height: 73 in, Weight: 210 lbs, BMI: 27.7, Temperature: 98.6 F, Pulse: 85 bpm, Respiratory Rate: 16 breaths/min, Blood Pressure: 138/73 mmHg. Respiratory normal breathing without difficulty. Psychiatric this patient is able to make decisions and demonstrates good insight into disease process. Alert and Oriented x 3. pleasant and cooperative. General Notes: Upon inspection today I did not notice any odor which the patient with somewhat concerned about as well. AARIB, PULIDO ([id number]) Fortunately there was no signs of active infection which was good news. I did perform chemical cauterization silver nitrate today which he tolerated  without complication to the hyper granular bugs in the central portion of the wound. Hopefully this is gonna help to continue to get these areas to flatten out. Integumentary (Hair, Skin) Wound #1 status is Open. Original cause of wound was Pressure Injury. The wound is located on the Right Gluteal fold. The wound measures 3cm length x 3cm width x 3cm depth; 7.069cm^2 area and 21.206cm^3 volume. There is muscle and Fat Layer (Subcutaneous Tissue) Exposed exposed. There is no undermining noted, however, there is tunneling at 12:00 with a maximum distance of 6cm. There is a large amount of serous drainage noted. The wound margin is epibole. There is large (67-100%) red, pink, hyper - granulation within the wound bed. There is a small (1-33%) amount of necrotic tissue within the wound bed including Adherent Slough. 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 An CHEM CAUT GRANULATION TISS procedure was performed by [name], Aarsh Fristoe E., [name]. Post procedure Diagnosis Wound #1: Same as Pre-Procedure Notes: 2 silver nitrate sticks used Plan Wound Cleansing: Wound #1 Right Gluteal fold: Cleanse wound with mild soap and water Anesthetic (add to Medication List): Wound #1 Right Gluteal fold: Topical Lidocaine 4% cream applied to wound bed prior to debridement (In Clinic Only). Primary Wound Dressing: Wound #1 Right Gluteal fold: Dry Gauze - wet to dry in clinic until [facility] restarts NPWT Secondary Dressing: Wound #1 Right Gluteal fold: Boardered Foam Dressing - in clinic until  [facility] restarts NPWT Dressing Change Frequency: FERNANDO, STOIBER ([id number]) Wound #1 Right Gluteal fold: Change Dressing  Monday, Wednesday, Friday - and as needed Follow-up Appointments: Wound #1 Right Gluteal fold: Return Appointment in 1 week. Off-Loading: Wound #1 Right Gluteal fold: Mattress - Please continue air mattress at SNF Turn and reposition every 2 hours Home Health: Wound #1 Right Gluteal fold: Cave Nurse may visit PRN to address patient s wound care needs. FACE TO FACE ENCOUNTER: MEDICARE and MEDICAID PATIENTS: I certify that this patient is under my care and that I had a face-to-face encounter that meets the physician face-to-face encounter requirements with this patient on this date. The encounter with the patient was in whole or in part for the following MEDICAL CONDITION: (primary reason for [place]) MEDICAL NECESSITY: I certify, that based on my findings, NURSING services are a medically necessary home health service. HOME BOUND STATUS: I certify that my clinical findings support that this patient is homebound (i.e., Due to illness or injury, pt requires aid of supportive devices such as crutches, cane, wheelchairs, walkers, the use of special transportation or the assistance of another person to leave their place of residence. There is a normal inability to leave the home and doing so requires considerable and taxing effort. Other absences are for medical reasons / religious services and are infrequent or of short duration when for other reasons). If current dressing causes regression in wound condition, may D/C ordered dressing product/s and apply Normal Saline Moist Dressing daily until next [place] / Other MD appointment. Leslie of regression in wound condition at [phone]. Please direct any NON-WOUND related issues/requests for orders to patient's Primary Care Physician Negative Pressure Wound Therapy: Wound #1 Right Gluteal fold: Wound VAC settings at 125/130 mmHg continuous pressure. Use BLACK/GREEN  foam to wound cavity. Use WHITE foam to fill any tunnel/s and/or undermining. Change VAC dressing 3 X WEEK. Change canister as indicated when full. Nurse may titrate settings and frequency of dressing changes as clinically indicated. - PLEASE, apply Silver Collagen to line the base of the wound. Black foam only! Lightly pack foam into 12 0'clock tunnel (4cm.), then place second piece of foam over wound opening. Please use and order for patient Duoderm to peri wound to help drape seal better. Thank you. Home Health Nurse may d/c VAC for s/s of increased infection, significant wound regression, or uncontrolled drainage. [place] at [phone]. Number of foam/gauze pieces used in the dressing = My suggestion at this point is gonna be to continue with the Wound VAC for the time being. We will keep a close eye on whether or not there's any odor and/or evidence of infection going forward. If anything changes in the meantime he will contact the office and let me know. Please see above for specific wound care orders. We will see patient for re-evaluation in 1 week(s) here in the clinic. If anything worsens or changes patient will contact our office for additional recommendations. I may need to consider sharp debridement of the hyper granular buds in the next week or so if things are not shown signs of improvement prior to that time. These are definitely a hindrance as far as getting the wound to heal. Electronic Signature(s) Signed: 12/16/2018 4:36:20 PM By: Worthy Keeler [name] Entered By: Worthy Keeler on 12/16/2018 08:50:37 ESVIN, HNAT ([id number]) -------------------------------------------------------------------------------- ROS/PFSH Details Patient Name: [name] Date of Service: 12/16/2018 8:00  AM Medical Record Number: [id number] Patient Account Number: [id number] Date of Birth/Sex: [birth year] (68 y.o. M) Treating RN: [name] Primary Care Provider:  [name] Other Clinician: Referring Provider: [name] Treating Provider/Extender: [name], Lakesa Coste Weeks in Treatment: 56 Information Obtained From Patient Constitutional Symptoms (General Health) Complaints and Symptoms: Negative for: Fatigue; Fever; Chills; Marked Weight Change Respiratory Complaints and Symptoms: Negative for: Chronic or frequent coughs; Shortness of Breath Medical History: Negative for: Aspiration; Asthma; Chronic Obstructive Pulmonary Disease (COPD); Pneumothorax; Sleep Apnea; Tuberculosis Cardiovascular Complaints and Symptoms: Negative for: Chest pain; LE edema Medical History: Positive for: Hypertension - takes medication Negative for: Angina; Arrhythmia; Congestive Heart Failure; Coronary Artery Disease; Deep Vein Thrombosis; Hypotension; Myocardial Infarction; Peripheral Arterial Disease; Peripheral Venous Disease; Phlebitis; Vasculitis Psychiatric Complaints and Symptoms: Negative for: Anxiety; Claustrophobia Medical History: Negative for: Anorexia/bulimia; Confinement Anxiety Eyes Medical History: Positive for: Cataracts - both removed Negative for: Glaucoma; Optic Neuritis Ear/Nose/Mouth/Throat Medical History: Negative for: Chronic sinus problems/congestion; Middle ear problems Hematologic/Lymphatic Medical History: Negative for: Anemia; Hemophilia; Human Immunodeficiency Virus; Lymphedema TYRICE, HEWITT. ([id number]) Gastrointestinal Medical History: Negative for: Cirrhosis ; Colitis; Crohnos; Hepatitis A; Hepatitis B; Hepatitis C Endocrine Medical History: Negative for: Type I Diabetes; Type II Diabetes Genitourinary Medical History: Negative for: End Stage Renal Disease Immunological Medical History: Negative for: Lupus Erythematosus; Raynaudos; Scleroderma Integumentary (Skin) Medical History: Negative for: History of Burn; History of pressure wounds Musculoskeletal Medical History: Negative for: Gout;  Rheumatoid Arthritis; Osteoarthritis; Osteomyelitis Neurologic Medical History: Positive for: Paraplegia - waist down Negative for: Dementia; Neuropathy; Quadriplegia; Seizure Disorder Oncologic Medical History: Negative for: Received Chemotherapy; Received Radiation Past Medical History Notes: Prostate cancer- currently treated with horomone therapy HBO Extended History Items Eyes: Cataracts Immunizations Pneumococcal Vaccine: Received Pneumococcal Vaccination: No Implantable Devices No devices added Family and Social History Cancer: No; Diabetes: No; Heart Disease: No; Hypertension: Yes - Father; Kidney Disease: No; Lung Disease: No; Seizures: No; Stroke: Yes - Mother; Thyroid Problems: No; Tuberculosis: No; Never smoker; Marital Status - Married; Alcohol Use: Never; Drug Use: No History; Caffeine Use: Daily DARROLL, BREDESON ([id number]) Physician Affirmation I have reviewed and agree with the above information. Electronic Signature(s) Signed: 12/16/2018 4:36:20 PM By: Worthy Keeler [name] Signed: 12/21/2018 5:54:26 PM By: Gretta Cool BSN, RN, CWS, Kim RN, BSN Entered By: Worthy Keeler on 12/16/2018 08:48:52 ANSELMO, REIHL ([id number]) -------------------------------------------------------------------------------- SuperBill Details Patient Name: [name] Date of Service: 12/16/2018 Medical Record Number: [id number] Patient Account Number: [id number] Date of Birth/Sex: 29-Dec-1950 (68 y.o. 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

## 2018-12-27 ENCOUNTER — Other Ambulatory Visit: Payer: Self-pay

## 2018-12-27 ENCOUNTER — Encounter: Payer: Medicare Other | Admitting: Physician Assistant

## 2018-12-27 DIAGNOSIS — L89314 Pressure ulcer of right buttock, stage 4: Secondary | ICD-10-CM | POA: Diagnosis not present

## 2018-12-29 NOTE — Progress Notes (Signed)
[name], [name] ([id number]) Visit Report for 12/27/2018 Chief Complaint Document Details Patient Name: [name], [name] Date of Service: 12/27/2018 8:00 AM Medical Record Number: [id number] Patient Account Number: [id number] Date of Birth/Sex: 08-03-1950 (68 y.o. M) Treating RN: Harold Barban Primary Care Provider: [name] Other Clinician: Referring Provider: [name] Treating Provider/Extender: [name], HOYT Weeks in Treatment: 78 Information Obtained from: Patient Chief Complaint Right gluteal fold ulcer Electronic Signature(s) Signed: 12/27/2018 4:26:55 PM By: Worthy Keeler [name] Entered By: Worthy Keeler on 12/27/2018 08:21:38 [name], [name] ([id number]) -------------------------------------------------------------------------------- Debridement Details Patient Name: [name] Date of Service: 12/27/2018 8:00 AM Medical Record Number: [id number] Patient Account Number: [id number] Date of Birth/Sex: 1951-03-14 (68 y.o. M) Treating RN: Harold Barban Primary Care Provider: [name] Other Clinician: Referring Provider: [name] Treating Provider/Extender: [name], HOYT Weeks in Treatment: 56 Debridement Performed for Wound #1 Right Gluteal fold Assessment: Performed By: Physician [name], HOYT E., [name] Debridement Type: Debridement Level of Consciousness (Pre- Awake and Alert procedure): Pre-procedure Verification/Time Yes - 08:20 Out Taken: Start Time: 08:20 Pain Control: Lidocaine Total Area Debrided (L x W): 3.5 (cm) x 3.4 (cm) = 11.9 (cm) Tissue and other material Viable, Non-Viable, Subcutaneous debrided: Level: Skin/Subcutaneous Tissue Debridement Description: Excisional Instrument: Blade, Forceps, Scissors Bleeding: Moderate Hemostasis Achieved: Silver Nitrate End Time: 08:30 Procedural Pain: 0 Post Procedural Pain: 0 Response to Treatment: Procedure was tolerated well Level of  Consciousness Awake and Alert (Post-procedure): Post Debridement Measurements of Total Wound Length: (cm) 3.5 Stage: Category/Stage IV Width: (cm) 3.4 Depth: (cm) 2.4 Volume: (cm) 22.431 Character of Wound/Ulcer Post Improved Debridement: Post Procedure Diagnosis Same as Pre-procedure Electronic Signature(s) Signed: 12/27/2018 4:26:55 PM By: Worthy Keeler [name] Signed: 12/29/2018 4:18:59 PM By: Harold Barban Entered By: Worthy Keeler on 12/27/2018 08:58:16 [name], [name] ([id number]) -------------------------------------------------------------------------------- HPI Details Patient Name: [name] Date of Service: 12/27/2018 8:00 AM Medical Record Number: [id number] Patient Account Number: [id number] Date of Birth/Sex: 05-03-1951 (68 y.o. 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 He states that there is no history of diabetes that he is aware of. He does have issues with his prostate and is currently receiving treatment for this by way of oral medication. With that being said I do not have a lot of details in that regard. Nonetheless the patient presents today as a result of having been referred to [place] by another provider initially home health was set to come out and take care of his wound although due to the fact that he apparently drives he's not able to receive home health. His wife is therefore trying to help take care of this wound within although they have been struggling with what exactly to do at this point.  She states that she can do some things but she is definitely not a nurse and does have some issues with looking at blood. The good news is the wound does not appear to be too deep and is fairly superficial at this point. There is no slough noted there is some nonviable skin noted around the surface of  the wound and the perimeter at this point. The central portion of the wound appears to be very good with a dermal layer noted this does not appear to be again deep enough to extend it to subcutaneous tissue at this point. 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 In the pump he has Dilaudid and baclofen. 12/07/17 on evaluation today patient presents for follow-up concerning his ongoing lower back thigh ulcer on the right. He states that he did not get the supplies ordered and therefore has not really been able to perform the dressing changes as directed exactly. His wife was able to get some Boarder Foam Dressing's from the drugstore and subsequently has been using hydrogel which did help to a degree in the wound does appear to be able smaller. There is actually more drainage this week noted than previous. 12/21/17 on evaluation today patient appears to be doing rather well in regard to his right gluteal ulcer. He has been tolerating the dressing changes without complication. There does not appear to be any evidence of infection at this point in time. Overall the wound does seem to be making some progress as far as the edges are concerned there's not as much in the way of overlapping of the external wound edges and he has a good epithelium to wound bed border for the most part. This however is not true right at the 12 o'clock location over the span of a little over a centimeters which actually will require debridement today to clean this away and hopefully allow it to continue to heal more  appropriately. 12/28/17 on evaluation today patient appears to be doing rather well in regard to his ulcer in the left gluteal region. He's been tolerating the dressing changes without complication. Apparently he has had some difficulty getting his dressing material. Apparently there's been some confusion with ordering we're gonna check into this. Nonetheless overall he's been showing signs of improvement which is good news. Debridement is not required today. 01/04/18 on evaluation today patient presents for follow-up concerning his right gluteal ulcer. He has been tolerating the dressing changes fairly well. On inspection today it appears he may actually have some maceration them concerned about the fact that he may be developing too much moisture in and around the wound bed which can cause delay in healing. With that being said he unfortunately really has not showed significant signs of improvement since last week's evaluation in fact this may even be just the little bit/slightly larger. Nonetheless he's been having a lot of discomfort I'm not sure this is even related to the wound as he has no pain when I'm to breeding or otherwise cleaning the wound during evaluation today. Nonetheless this is something that we did recommend he talked to his pain specialist concerning. 01/11/18 on evaluation today patient appears to be doing better in regard to his ulceration. He has been tolerating the dressing changes without complication. With that being said overall there's no evidence of infection which is good news. The only thing is he did receive the hatch affair blue classic versus the ready nonetheless I feel like this is perfectly fine and appears to have done well for him over the past week. 01/25/18 on evaluation today patient's wound actually appears to be a little bit larger than during the last evaluation. The good [name], SCHAUF. ([id number]) news is the majority of the  wound edges actually  appear to be fairly firmly attached to the wound bed unfortunately again we're not really making progress in regard to the size. Roughly the wound is about the same size as when I first saw him although again the wound margin/edges appear to be much better. 02/01/18 on evaluation today patient actually appears to be doing very well in regard to his wound. Applying the Prisma dry does seem to be better although he does still have issues with slow progression of the wound. There was a slight improvement compared to last week's measurements today. Nonetheless I have been considering other options as far as the possibility of Theraskin or even a snap vac. In general I'm not sure that the Theraskin due to location of the wound would be a very good idea. Nonetheless I do think that a snap vac could be a possibility for the patient and in fact I think this could even be an excellent way to manage the wound possibly seeing some improvement in a very rapid fashion here. Nonetheless this is something that we would need to get approved and I did have a lengthy conversation with the patient about this today. 02/08/18 on evaluation today patient appears to be doing a little better in regard to his ulcer. He has been tolerating the dressing changes without complication. Fortunately despite the fact that the wound is a little bit smaller it's not significantly so unfortunately. We have discussed the possibility of a snap vac we did check with insurance this is actually covered at this point. Fortunately there does not appear to be any sign of infection. Overall I'm fairly pleased with how things seem to be appearing at this point. 02/15/18 on evaluation today patient appears to be doing rather well in regard to his right gluteal ulcer. Unfortunately the snap vac did not stay in place with his sheer and friction this came loose and did not seem to maintain seal very well. He worked for about two days and it did seem  to do very well during that time according to his wife but in general this does not seem to be something that's gonna be beneficial for him long-term. I do believe we need to go back to standard dressings to see if we can find something that will be of benefit. 03/02/18- He is here in follow up evaluation; there is minimal change in the wound. He will continue with the same treatment plan, would consider changing to iodosrob/iodoflex if ulcer continues to to plateau. He will follow up next week 03/08/18 on evaluation today patient's wound actually appears to be about the same size as when I previously saw him several weeks back. Unfortunately he does have some slightly dark discoloration in the central portion of the wound which has me concerned about pressure injury. I do believe he may be sitting for too long a period of time in fact he tells me that "I probably sit for much too long". He does have some Slough noted on the surface of the wound and again as far as the size of the wound is concerned I'm really not seeing anything that seems to have improved significantly. 03/15/18 on evaluation today patient appears to be doing fairly well in regard to his ulcer. The wound measured pretty much about the same today compared to last week's evaluation when looking at his graph. With that being said the area of bruising/deep tissue injury that was noted last week I do not see at  this point. He did get a new cushion fortunately this does seem to be have been of benefit in my pinion. It does appear that he's been off of this more which is good news as well I think that is definitely showing in the overall wound measurements. With that being said I do believe that he needs to continue to offload I don't think that the fact this is doing better should be or is going to allow him to not have to offload and explain this to him as well. Overall he seems to be in agreement the plan I think he understands. The  overall appearance of the wound bed is improved compared to last week I think the Iodoflex has been beneficial in that regard. 03/29/18 on evaluation today patient actually appears to be doing rather well in regard to his wound from the overall appearance standpoint he does have some granulation although there's some Slough on the surface of the wound noted as well. With that being said he unfortunately has not improved in regard to the overall measurement of the wound in volume or in size. I did have a discussion with him very specifically about offloading today. He actually does work although he mainly is just sitting throughout the day. He tells me he offloads by "lifting himself up for 30 seconds off of his chair occasionally" purchase from [employer] which does seem to have helped. And he has a new cushion that he with that being said he's also able to stand some for a very short period of time but not significant enough I think to provide appropriate offloading. I think the biggest issue at this point with the wound and the fact is not healing as quickly as we would like is due to the fact that he is really not able to appropriately offload while at work. He states the beginning after his injury he actually had a bed at his job that he could lay on in order to offload and that does seem to have been of help back at that time. Nonetheless he had not done this in quite some time unfortunately. I think that could be helpful for him this is something I would like for him to look into. 04/05/18 on evaluation today patient actually presents for follow-up concerning his right gluteal ulcer. Again he really is not significantly improved even compared to last week. He has been tolerating the dressing changes without complication. With that being said fortunately there appears to be no evidence of infection at this time. He has been more proactive in trying to offload. [name], [name]  ([id number]) 04/12/18 on evaluation today patient actually appears to be doing a little better in regard to his wound and the right gluteal fold region. He's been tolerating the dressing changes since removing the oasis without complication. However he was having a lot of burning initially with the oasis in place. He's unsure of exactly why this was given so much discomfort but he assumes that it was the oasis itself causing the problem. Nonetheless this had to be removed after about three days in place although even those three days seem to have made a fairly good improvement in regard to the overall appearance of the wound bed. In fact is the first time that he's made any improvement from the standpoint of measurements in about six weeks. He continues to have no discomfort over the area of the wound itself which leads me to wonder why he was  having the burning with the oasis when he does not even feel the actual debridement's themselves. I am somewhat perplexed by this. 04/19/18 on evaluation today patient's wound actually appears to be showing signs of epithelialization around the edge of the wound and in general actually appears to be doing better which is good news. He did have the same burning after about three days with applying the Endoform last week in the same fashion that I would generally apply a skin substitute. This seems to indicate that it's not the oasis to cause the problem but potentially the moisture buildup that just causes things to burn or there may be some other reaction with the skin prep or Steri-Strips. Nonetheless I'm not sure that is gonna be able to tolerate any skin substitute for a long period of time. The good news is the wound actually appears to be doing better today compared to last week and does seem to finally be making some progress. 04/26/18 on evaluation today patient actually appears to be doing rather well in regard to his ulcer in the right gluteal fold.  He has been tolerating the dressing changes without complication which is good news. The Endoform does seem to be helping although he was a little bit more macerated this week. This seems to be an ongoing issue with fluid control at this point. Nonetheless I think we may be able to add something like Drawtex to help control the drainage. 05/03/18 on evaluation today patient appears to actually be doing better in regard to the overall appearance of his wound. He has been tolerating the dressing changes without complication. Fortunately there appears to be no evidence of infection at this time. I really feel like his wound has shown signs as of today of turning around last week I thought so as well and definitely he could be seen in this week's overall appearance and measurements. In general I'm very pleased with the fact that he finally seems to be making a steady but sure progress. The patient likewise is very pleased. 05/17/18 on evaluation today patient appears to be doing more poorly unfortunately in regard to his ulcer. He has been tolerating the dressing changes without complication. With that being said he tells me that in the past couple of days he and his wife have noticed that we did not seem to be doing quite as well is getting dark near the center. Subsequently upon evaluation today the wound actually does appear to be doing worse compared to previous. He has been tolerating the dressing changes otherwise and he states that he is not been sitting up anymore than he was in the past from what he tells me. Still he has continued to work he states "I'm tired of dealing with this and if I have to just go home and lay in the bed all the time that's what I'll do". Nonetheless I am concerned about the fact that this wound does appear to be deeper than what it was previous. 05/24/18 upon evaluation today patient actually presents after having been in the hospital due to what was presumed to  be sepsis secondary to the wound infection. He had an elevated white blood cell count between 14 and 15. With that being said he does seem to be doing somewhat better now. His wound still is giving him some trouble nonetheless and he is obviously concerned about the fact likely talked about that this does seem to go more deeply than previously noted. I did review  his wound culture which showed evidence of Staphylococcus aureus him and group B strep. Nonetheless he is on antibiotics, Levaquin, for this. Subsequently I did review his intake summary from the hospital as well. I also did look at the CT of the lumbar spine with contrast that was performed which showed no bone destruction to suggest lumbar disguises/osteomyelitis or sacral osteomyelitis. There was no paraspinal abscess. Nonetheless it appears this may have been more of just a soft tissue infection at this point which is good news. He still is nonetheless concerned about the wound which again I think is completely reasonable considering everything he's been through recently. 05/31/18 on evaluation today on evaluation today patient actually appears to be showing signs of his wound be a little bit deeper than what I would like to see. Fortunately he does not show any signs of significant infection although his temperature was 99 today he states he's been checking this at home and has not been elevated. Nonetheless with the undermining that I'm seeing at this point I am becoming more concerned about the wound I do think that offloading is a key factor here that is preventing the speedy recovery at this point. There does not appear to be any evidence of again over infection noted. He's been using Santyl currently. 06/07/18 the patient presents today for follow-up evaluation regarding the left ulcer in the gluteal region. He has been tolerating the Wound VAC fairly well. He is obviously very frustrated with this he states that to mean is really  getting in his way. There does not appear to be any evidence of infection at this time he does have a little bit of odor I do not necessarily associate this with infection just something that we sometimes notice with Wound VAC therapy. With that being said I can definitely catch a tone of discontentment overall in the patient's demeanor today. This when he was previously in the hospital [name], [name]. ([id number]) an CT scan was done of the lumbar region which did not reveal any signs of osteomyelitis. With that being said the pelvis in particular was not evaluated distinctly which means he could still have some osteonecrosis I. Nonetheless the Wound VAC was started on Thursday I do want to get this little bit more time before jumping to a CT scan of the pelvis although that is something that I might would recommend if were not see an improvement by that time. 06/14/18 on evaluation today patient actually appears to be doing about the same in regard to his right gluteal ulcer. Again he did have a CT scan of the lumbar spine unfortunately this did not include the pelvis. Nonetheless with the depth of the wound that I'm seeing today even despite the fact that I'm not seeing any evidence of overt cellulitis I believe there's a good chance that we may be dealing with osteomyelitis somewhere in the right Ischial region. No fevers, chills, nausea, or vomiting noted at this time. 06/21/18 on evaluation today patient actually appears to be doing about the same with regard to his wound. The tunnel at 6 o'clock really does not appear to be any deeper although it is a little bit wider. I think at this point you may want to start packing this with white phone. Unfortunately I have not got approval for the CT scan of the pelvis as of yet due to the fact that Medicare apparently has been denied it due to the diagnosis codes not being appropriate according to [facility]  for the test requested. With that being  said the patient cannot have an MRI and therefore this is the only option that we have as far as testing is concerned. The patient has had infection and was on antibiotics and been added code for cellulitis of the bottom to see if this will be appropriate for getting the test approved. Nonetheless I'm concerned about the infection have been spread deeper into the Ischial region. 06/28/18 on evaluation today patient actually appears to be doing rather well all things considered in regard to the right gluteal ulcer. He has been tolerating the dressing changes without complication. With that being said the Wound VAC he states does have to be replaced almost every day or at least reinforced unfortunately. Patient actually has his CT scan later this morning we should have the results by tomorrow. 07/05/18 on evaluation today patient presents for follow-up concerning his right Ischial ulcer. He did see the surgeon Dr. Lysle Pearl last week. They were actually very happy with him and felt like he spent a tremendous amount of time with them as far as discussing his situation was concerned. In the end Dr. Lysle Pearl did contact me as well and determine that he would not recommend any surgical intervention at this point as he felt like it would not be in the patient's best interest based on what he was seeing. He recommended a referral to infectious disease. Subsequently this is something that Dr. Ines Bloomer office is working on setting up for the patient. As far as evaluation today is concerned the patient's wound actually appears to be worse at this point. I am concerned about how things are progressing and specifically about infection. I do not feel like it's the deeper but the area of depth is definitely widening which does have me concerned. No fevers, chills, nausea, or vomiting noted at this time. I think that we do need initiate antibiotic therapy the patient has an allow allergy to amoxicillin/penicillin he states  that he gets a rash since childhood. Nonetheless she's never had the issues with [employer] or cephalosporins in general but he is aware of. 07/27/18 on evaluation today patient presents following admission to the hospital on 07/09/18. He was subsequently discharged on 07/20/18. On 07/15/18 the patient underwent irrigation and debridement was soft tissue biopsy and bone biopsy as well as placement of a Wound VAC in the OR by Dr. Celine Ahr. During the hospital course the patient was placed on a Wound VAC and recommended follow up with surgery in three weeks actually with Dr. Delaine Lame who is infectious disease. The patient was on vancomycin during the hospital course. 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 He also had a bone culture which revealed evidence of methicillin-resistant staph aureus. He is updated CT scan 07/09/18 reveals that he had progression of the which was performed on wound to breakdown down to the trochanter where he actually had irregularities there as well suggestive of osteomyelitis. This was a change just since 9 December when we last performed a CT scan. Obviously this one had gone downhill quite significantly and rapidly. At this point upon evaluation I feel like in general the patient's wound seems to be doing fairly well all things considered upon my evaluation today. Obviously this is larger and deeper than what I previously evaluated but at the same time he seems to be making some progress as far as the appearance of the granulation tissue is concerned. I'm happy in that regard. No  fevers, chills, nausea, or vomiting noted at this time. He is on IV vancomycin and Rocephin at the facility. He is currently in [facility]. 08/03/18 upon evaluation today patient's wound appears to be doing better in regard to the overall appearance at this point in time. Fortunately he's been tolerating the Wound VAC without complication and states that  the facility has been taking excellent care of the wound site. Overall I see some Slough noted on the surface which I am going to attempt sharp debridement today of but nonetheless other than this I feel like he's making progress. 08/09/18 on evaluation today patient's wound appears to be doing much better compared to even last week's evaluation. Do believe that the Wound VAC is been of great benefit for him. He has been tolerating the dressing changes that is the Wound [name], [name]. ([id number]) VAC without any complication and he has excellent granulation noted currently. There is no need for sharp debridement at this point. 08/16/18 on evaluation today patient actually appears to be doing very well in regard to the wound in the right gluteal fold region. This is showing signs of progress and again appears to be very healthy which is excellent news. Fortunately there is no sign of active infection by way of odor or drainage at this point. Overall I'm very pleased with how things stand. He seems to be tolerating the Wound VAC without complication. 08/23/18 on evaluation today patient actually appears to be doing better in regard to his wound. He has been tolerating the Wound VAC without complication and in fact it has been collecting a significant amount of drainage which I think is good news especially considering how the wound appears. Fortunately there is no signs of infection at this time definitely nothing appears to be worse which is good news. He has not been started on the Bactrim and Flagyl that was recommended by Dr. Delaine Lame yet. I did actually contact her office this morning in order to check and see were things are that regard their gonna be calling me back. 08/30/18 on evaluation today patient actually appears to show signs of excellent improvement today compared to last evaluation. The undermining is getting much better the wound seems to be feeling quite nicely and I'm very  pleased that the granulation in general. With that being said overall I feel like the patient has made excellent progress which is great news. No fevers, chills, nausea, or vomiting noted at this time. 09/06/18 on evaluation today patient actually appears to be doing rather well in regard to his right gluteal ulcer. This is showing signs of improvement in overall I'm very pleased with how things seem to be progressing. The patient likewise is please. Overall I see no evidence of infection he is about to complete his oral antibiotic regimen which is the end of the antibiotics for him in just about three days. 09/13/18 on evaluation today patient's right Ischial ulcer appears to be showing signs of continued improvement which is excellent news. He's been tolerating the dressing changes without complication. Fortunately there's no signs of infection and the wound that seems to be doing very well. 09/28/18 on evaluation today patient appears to be doing rather well in regard to his right Ischial ulcer. He's been tolerating the Wound VAC without complication he knows there's much less drainage than there used to be this obviously is not a bad thing in my pinion. There's no evidence of infection despite the fact is but nothing about it  now for several weeks. 10/04/18 on evaluation today patient appears to be doing better in regard to his right Ischial wound. He has been tolerating the Wound VAC without complication and I do believe that the silver nitrate last week was beneficial for him. Fortunately overall there's no evidence of active infection at this time which is great news. No fevers, chills, nausea, or vomiting noted at this time. 10/11/18 on evaluation today patient actually appears to be doing rather well in regard to his Ischial ulcer. He's been tolerating the Wound VAC still without complication I feel like this is doing a good job. No fevers, chills, nausea, or vomiting noted at  this time. 11/01/18 on evaluation today patient presents after having not been seen in our clinic for several weeks secondary to the fact that he was on evaluation today patient presents after having not been seen in our clinic for several weeks secondary to the fact that he was in a skilled nursing facility which was on lockdown currently due to the covert 19 national emergency. Subsequently he was discharged from the facility on this past Friday and subsequently made an appointment to come in to see yesterday. Fortunately there's no signs of active infection at this time which is good news and overall he does seem to have made progress since I last saw. Overall I feel like things are progressing quite nicely. The patient is having no pain. 11/08/18 on evaluation today patient appears to be doing okay in regard to his right gluteal ulcer. He has been utilizing a Wound VAC home health this changing this at this point since he's home from the skilled nursing facility. Fortunately there's no signs of obvious active infection at this time. Unfortunately though there's no obvious active infection he is having some maceration and his wife states that when the sheets of the Wound VAC office on Sunday when it broke seal that he ended up having significant issues with some smell as well there concerned about the possibility of infection. Fortunately there's No fevers, chills, nausea, or vomiting noted at this time. 11/15/18 on evaluation today patient actually appears to be doing well in regard to his right gluteal ulcer. He has been tolerating the dressing changes without complication. Specifically the Wound VAC has been utilized up to this point. Fortunately there's no signs of infection and overall I feel like he has made progress even since last week when I last saw him. I'm actually fairly happy with the overall appearance although he does seem to have somewhat of a hyper granular Gomes, Westyn J.  ([id number]) overgrowth in the central portion of the wound which I think may require some sharp debridement to try flatness out possibly utilizing chemical cauterization following. 11/23/18 on evaluation today patient actually appears to be doing very well in regard to his sacral ulcer. He seems to be showing signs of improvement with good granulation. With that being said he still has the small area of hyper granulation right in the central portion of the wound which I'm gonna likely utilize silver nitrate on today. Subsequently he also keeps having a leak at the 6 o'clock location which is unfortunate we may be able to help out with some suggestions to try to prevent this going forward. Fortunately there's no signs of active infection at this time. 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 He's been tolerating the dressing changes without  complication. Fortunately there's no signs of active infection at this time. I've been rather pleased with how things have progressed there still some evidence of pressure getting to the area with some redness right around the immediate wound opening. Nonetheless other than this I'm not seeing any significant complications or issues the wound is somewhat hyper granular. Upon discussing with the patient and his wife today I'm not sure that the wound is being packed to the base with the foam at this point. And if it's not been packed fully that may be part of the reason why is not seen as much improvement as far as the granulation from the base out. Again we do not want pack too tightly but we need some of the firm to get to the base of the wound. I discussed this with patient and his wife today. 12/06/18 on evaluation today patient appears to be doing well in regard to his right gluteal pressure ulcer. He's been tolerating the dressing changes without complication.  Fortunately there's no signs of active infection. He still has some hyper granular tissue and I do think it would be appropriate to continue with the chemical cauterization as of today. 12/16/18 on evaluation today patient actually appears to be doing okay in regard to his right gluteal ulcer. He is been tolerating the dressing changes without complication including the Wound VAC. Overall I feel like nothing seems to be worsening I do feel like that the hyper granulation buds in the central portion of the wound have improved to some degree with the silver nitrate. We will have to see how things continue to progress. 12/20/18 on evaluation today patient actually appears to be doing much worse in my pinion even compared to last week's evaluation. Unfortunately as opposed to showing any signs of improvement the areas of hyper granular tissue in the central portion of the wound seem to be getting worse. Subsequently the wound bed itself also seems to be getting deeper even compared to last week which is both unusual as well as concerning since prior he had been shown signs of improvement. Nonetheless I think that the issue could be that he's actually having some difficulty in issues with a deeper infection. There's no external signs of infection but nonetheless I am more worried about the internal, osteomyelitis, that could be restarting. He has not been on antibiotics for some time at this point. I think that it may be a good idea to go ahead and started back on an antibiotic therapy while we wait to see what the testing shows. 12/27/18 on evaluation today patient presents for follow-up concerning his left gluteal fold wound. Fortunately he appears to be doing well today. I did review the CT scan which was negative for any signs of osteomyelitis or acute abnormality this is excellent news. Overall I feel like the surface of the wound bed appears to be doing significantly better today compared to previously  noted findings. There does not appear any signs of infection nor does he have any pain at this time. Electronic Signature(s) Signed: 12/27/2018 4:26:55 PM By: Worthy Keeler [name] Entered By: Worthy Keeler on 12/27/2018 14:06:47 KEYMARI, SATO ([id number]) -------------------------------------------------------------------------------- Physical Exam Details Patient Name: [name] Date of Service: 12/27/2018 8:00 AM Medical Record Number: [id number] Patient Account Number: [id number] Date of Birth/Sex: 1951/02/02 (68 y.o. M) Treating RN: Harold Barban Primary Care Provider: [name] Other Clinician: Referring Provider: [name] Treating Provider/Extender: [name], HOYT Weeks in Treatment: 34  Constitutional Well-nourished and well-hydrated in no acute distress. Respiratory normal breathing without difficulty. Psychiatric this patient is able to make decisions and demonstrates good insight into disease process. Alert and Oriented x 3. pleasant and cooperative. Notes Patient's wound bed currently showed signs of good granulation which is excellent news and fortunately he does not appear to show any evidence of active infection also good news. I did have to perform sharp agreement to remove hyper granular tissue from the base of the wound to try to help this heal more appropriately and quickly. He actually tolerated the debridement today without complication he did have some bleeding hemostasis was a obtained with use of silver nitrate. Electronic Signature(s) Signed: 12/27/2018 4:26:55 PM By: Worthy Keeler [name] Entered By: Worthy Keeler on 12/27/2018 14:07:30 [name], [name] ([id number]) -------------------------------------------------------------------------------- Physician Orders Details Patient Name: [name] Date of Service: 12/27/2018 8:00 AM Medical Record Number: [id number] Patient Account Number: [id number] Date of Birth/Sex:  Aug 31, 1950 (68 y.o. 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 Primary Wound Dressing Wound #1 Right Gluteal fold o Pack wound with: - Dakin's 0.25% soaked gauze packed into the wound bed Secondary Dressing Wound #1 Right Gluteal fold o ABD pad - Tegaderm to secure Dressing Change Frequency Wound #1 Right Gluteal fold o Change dressing every day. Follow-up Appointments Wound #1 Right Gluteal fold o Return Appointment in 1 week. Off-Loading Wound #1 Right Gluteal fold o Turn and reposition every 2 hours Home Health Wound #1 Right Gluteal fold o [place] [facility] to visit patient for wound care o Home Health Nurse may visit PRN to address patientos wound care needs. CANAAN, HOLZER ([id number]) o FACE TO FACE ENCOUNTER: MEDICARE and MEDICAID PATIENTS: I certify that this patient is under my care and that I had a face-to-face encounter that meets the physician face-to-face encounter requirements with this patient on this date. The encounter with the patient was in whole or in part for the following MEDICAL CONDITION: (primary reason for [place]) MEDICAL NECESSITY: I certify, that based on my findings, NURSING services are a medically necessary home  health service. HOME BOUND STATUS: I certify that my clinical findings support that this patient is homebound (i.e., Due to illness or injury, pt requires aid of supportive devices such as crutches, cane, wheelchairs, walkers, the use of special transportation or the assistance of another person to leave their place of residence. There is a normal inability to leave the home and doing so requires considerable and taxing effort. Other absences are for medical reasons / religious services and are infrequent or of short duration when for other reasons). o If current dressing causes regression in wound condition, may D/C ordered dressing product/s and apply Normal Saline Moist Dressing daily until next [place] / Other MD appointment. [place] of regression in wound condition at [phone]. o Please direct any NON-WOUND related issues/requests  for orders to patient's Primary Care Physician Negative Pressure Wound Therapy Wound #1 Right Gluteal fold o Discontinue NPWT. Electronic Signature(s) Signed: 12/27/2018 4:19:49 PM By: Montey Hora Signed: 12/27/2018 4:26:55 PM By: Worthy Keeler [name] Entered By: Montey Hora on 12/27/2018 15:50:21 ERVING, SASSANO ([id number]) -------------------------------------------------------------------------------- Problem List Details Patient Name: [name] Date of Service: 12/27/2018 8:00 AM Medical Record Number: [id number] Patient Account Number: [id number] Date of Birth/Sex: 20-Nov-1950 (68 y.o. M) Treating RN: Harold Barban Primary Care Provider: [name] Other Clinician: Referring Provider: [name] Treating Provider/Extender: [name], HOYT Weeks in Treatment: 43 Active Problems ICD-10 Evaluated Encounter Code Description Active Date Today Diagnosis L89.314 Pressure ulcer of right buttock, stage 4 11/30/2017 No Yes L03.317 Cellulitis of buttock 06/21/2018 No Yes G82.20  Paraplegia, unspecified 11/30/2017 No Yes S34.109S Unspecified injury to unspecified level of lumbar spinal cord, 11/30/2017 No Yes sequela I10 Essential (primary) hypertension 11/30/2017 No Yes Inactive Problems Resolved Problems Electronic Signature(s) Signed: 12/27/2018 4:26:55 PM By: Worthy Keeler [name] Entered By: Worthy Keeler on 12/27/2018 08:21:28 CAMEO, SHEWELL ([id number]) -------------------------------------------------------------------------------- Progress Note Details Patient Name: [name] Date of Service: 12/27/2018 8:00 AM Medical Record Number: [id number] Patient Account Number: [id number] Date of Birth/Sex: 08/08/1950 (68 y.o. 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 He states that there is no history of diabetes that he is aware of. He does have issues with his prostate and is currently receiving treatment for this by way of oral medication. With that being said I do not have a lot of details in that regard. Nonetheless the patient presents today as a result of having been referred to [place] by another provider initially home health was set to come out and take care of his wound although due to the fact that he apparently drives he's not able to receive home health. His wife is therefore trying to help take care of this wound within although they have been struggling with what exactly to do at this point. She states that she can do  some things but she is definitely not a nurse and does have some issues with looking at blood. The good news is the wound does not appear to be too deep and is fairly superficial at this point. There is no slough noted there is some nonviable skin noted around the surface of the wound and the perimeter at this point. The central portion of the wound appears to be very good with a dermal layer noted this does not appear to be again deep enough to extend it to subcutaneous tissue at this point. 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 In the pump he has Dilaudid and baclofen. 12/07/17 on evaluation today patient presents for follow-up concerning his ongoing lower back thigh ulcer on the right. He states that he did not get the supplies ordered and therefore has not really been able to perform the dressing changes as directed  exactly. His wife was able to get some Boarder Foam Dressing's from the drugstore and subsequently has been using hydrogel which did help to a degree in the wound does appear to be able smaller. There is actually more drainage this week noted than previous. 12/21/17 on evaluation today patient appears to be doing rather well in regard to his right gluteal ulcer. He has been tolerating the dressing changes without complication. There does not appear to be any evidence of infection at this point in time. Overall the wound does seem to be making some progress as far as the edges are concerned there's not as much in the way of overlapping of the external wound edges and he has a good epithelium to wound bed border for the most part. This however is not true right at the 12 o'clock location over the span of a little over a centimeters which actually will require debridement today to clean this away and hopefully allow it to continue to heal more appropriately. 12/28/17 on evaluation  today patient appears to be doing rather well in regard to his ulcer in the left gluteal region. He's been tolerating the dressing changes without complication. Apparently he has had some difficulty getting his dressing material. Apparently there's been some confusion with ordering we're gonna check into this. Nonetheless overall he's been showing signs of improvement which is good news. Debridement is not required today. 01/04/18 on evaluation today patient presents for follow-up concerning his right gluteal ulcer. He has been tolerating the dressing changes fairly well. On inspection today it appears he may actually have some maceration them concerned about the fact that he may be developing too much moisture in and around the wound bed which can cause delay in healing. With that being said he unfortunately really has not showed significant signs of improvement since last week's evaluation in fact this may even be just the little bit/slightly larger. Nonetheless he's been having a lot of discomfort I'm not sure this is even related to the wound as he has no pain when I'm to breeding or otherwise cleaning the wound during evaluation today. Nonetheless this is something that we did recommend he talked to his pain specialist concerning. 01/11/18 on evaluation today patient appears to be doing better in regard to his ulceration. He has been tolerating the dressing Bialecki, Webb J. ([id number]) changes without complication. With that being said overall there's no evidence of infection which is good news. The only thing is he did receive the hatch affair blue classic versus the ready nonetheless I feel like this is perfectly fine and appears to have done well for him over the past week. 01/25/18 on evaluation today patient's wound actually appears to be a little bit larger than during the last evaluation. The good news is the majority of the wound edges actually appear to be fairly firmly attached to the  wound bed unfortunately again we're not really making progress in regard to the size. Roughly the wound is about the same size as when I first saw him although again the wound margin/edges appear to be much better. 02/01/18 on evaluation today patient actually appears to be doing very well in regard to his wound. Applying the Prisma dry does seem to be better although he does still have issues with slow progression of the wound. There was a slight improvement compared to last week's measurements today. Nonetheless I have been considering other options as far as the possibility of Theraskin or even a snap vac.  In general I'm not sure that the Theraskin due to location of the wound would be a very good idea. Nonetheless I do think that a snap vac could be a possibility for the patient and in fact I think this could even be an excellent way to manage the wound possibly seeing some improvement in a very rapid fashion here. Nonetheless this is something that we would need to get approved and I did have a lengthy conversation with the patient about this today. 02/08/18 on evaluation today patient appears to be doing a little better in regard to his ulcer. He has been tolerating the dressing changes without complication. Fortunately despite the fact that the wound is a little bit smaller it's not significantly so unfortunately. We have discussed the possibility of a snap vac we did check with insurance this is actually covered at this point. Fortunately there does not appear to be any sign of infection. Overall I'm fairly pleased with how things seem to be appearing at this point. 02/15/18 on evaluation today patient appears to be doing rather well in regard to his right gluteal ulcer. Unfortunately the snap vac did not stay in place with his sheer and friction this came loose and did not seem to maintain seal very well. He worked for about two days and it did seem to do very well during that time according  to his wife but in general this does not seem to be something that's gonna be beneficial for him long-term. I do believe we need to go back to standard dressings to see if we can find something that will be of benefit. 03/02/18- He is here in follow up evaluation; there is minimal change in the wound. He will continue with the same treatment plan, would consider changing to iodosrob/iodoflex if ulcer continues to to plateau. He will follow up next week 03/08/18 on evaluation today patient's wound actually appears to be about the same size as when I previously saw him several weeks back. Unfortunately he does have some slightly dark discoloration in the central portion of the wound which has me concerned about pressure injury. I do believe he may be sitting for too long a period of time in fact he tells me that "I probably sit for much too long". He does have some Slough noted on the surface of the wound and again as far as the size of the wound is concerned I'm really not seeing anything that seems to have improved significantly. 03/15/18 on evaluation today patient appears to be doing fairly well in regard to his ulcer. The wound measured pretty much about the same today compared to last week's evaluation when looking at his graph. With that being said the area of bruising/deep tissue injury that was noted last week I do not see at this point. He did get a new cushion fortunately this does seem to be have been of benefit in my pinion. It does appear that he's been off of this more which is good news as well I think that is definitely showing in the overall wound measurements. With that being said I do believe that he needs to continue to offload I don't think that the fact this is doing better should be or is going to allow him to not have to offload and explain this to him as well. Overall he seems to be in agreement the plan I think he understands. The overall appearance of the wound bed is improved  compared to last  week I think the Iodoflex has been beneficial in that regard. 03/29/18 on evaluation today patient actually appears to be doing rather well in regard to his wound from the overall appearance standpoint he does have some granulation although there's some Slough on the surface of the wound noted as well. With that being said he unfortunately has not improved in regard to the overall measurement of the wound in volume or in size. I did have a discussion with him very specifically about offloading today. He actually does work although he mainly is just sitting throughout the day. He tells me he offloads by "lifting himself up for 30 seconds off of his chair occasionally" purchase from [employer] which does seem to have helped. And he has a new cushion that he with that being said he's also able to stand some for a very short period of time but not significant enough I think to provide appropriate offloading. I think the biggest issue at this point with the wound and the fact is not healing as quickly as we would like is due to the fact that he is really not able to appropriately offload while at work. He states the beginning after his injury he actually had a bed at his job that he could lay on in order to offload and that does seem to have been of help back at that time. Nonetheless he had not done this in quite some time unfortunately. I think that could be helpful for him this is something I would like for him to look into. THAYDEN, LEMIRE ([id number]) 04/05/18 on evaluation today patient actually presents for follow-up concerning his right gluteal ulcer. Again he really is not significantly improved even compared to last week. He has been tolerating the dressing changes without complication. With that being said fortunately there appears to be no evidence of infection at this time. He has been more proactive in trying to offload. 04/12/18 on evaluation today patient actually  appears to be doing a little better in regard to his wound and the right gluteal fold region. He's been tolerating the dressing changes since removing the oasis without complication. However he was having a lot of burning initially with the oasis in place. He's unsure of exactly why this was given so much discomfort but he assumes that it was the oasis itself causing the problem. Nonetheless this had to be removed after about three days in place although even those three days seem to have made a fairly good improvement in regard to the overall appearance of the wound bed. In fact is the first time that he's made any improvement from the standpoint of measurements in about six weeks. He continues to have no discomfort over the area of the wound itself which leads me to wonder why he was having the burning with the oasis when he does not even feel the actual debridement's themselves. I am somewhat perplexed by this. 04/19/18 on evaluation today patient's wound actually appears to be showing signs of epithelialization around the edge of the wound and in general actually appears to be doing better which is good news. He did have the same burning after about three days with applying the Endoform last week in the same fashion that I would generally apply a skin substitute. This seems to indicate that it's not the oasis to cause the problem but potentially the moisture buildup that just causes things to burn or there may be some other reaction with the skin prep  or Steri-Strips. Nonetheless I'm not sure that is gonna be able to tolerate any skin substitute for a long period of time. The good news is the wound actually appears to be doing better today compared to last week and does seem to finally be making some progress. 04/26/18 on evaluation today patient actually appears to be doing rather well in regard to his ulcer in the right gluteal fold. He has been tolerating the dressing changes without  complication which is good news. The Endoform does seem to be helping although he was a little bit more macerated this week. This seems to be an ongoing issue with fluid control at this point. Nonetheless I think we may be able to add something like Drawtex to help control the drainage. 05/03/18 on evaluation today patient appears to actually be doing better in regard to the overall appearance of his wound. He has been tolerating the dressing changes without complication. Fortunately there appears to be no evidence of infection at this time. I really feel like his wound has shown signs as of today of turning around last week I thought so as well and definitely he could be seen in this week's overall appearance and measurements. In general I'm very pleased with the fact that he finally seems to be making a steady but sure progress. The patient likewise is very pleased. 05/17/18 on evaluation today patient appears to be doing more poorly unfortunately in regard to his ulcer. He has been tolerating the dressing changes without complication. With that being said he tells me that in the past couple of days he and his wife have noticed that we did not seem to be doing quite as well is getting dark near the center. Subsequently upon evaluation today the wound actually does appear to be doing worse compared to previous. He has been tolerating the dressing changes otherwise and he states that he is not been sitting up anymore than he was in the past from what he tells me. Still he has continued to work he states "I'm tired of dealing with this and if I have to just go home and lay in the bed all the time that's what I'll do". Nonetheless I am concerned about the fact that this wound does appear to be deeper than what it was previous. 05/24/18 upon evaluation today patient actually presents after having been in the hospital due to what was presumed to be sepsis secondary to the wound infection. He had an  elevated white blood cell count between 14 and 15. With that being said he does seem to be doing somewhat better now. His wound still is giving him some trouble nonetheless and he is obviously concerned about the fact likely talked about that this does seem to go more deeply than previously noted. I did review his wound culture which showed evidence of Staphylococcus aureus him and group B strep. Nonetheless he is on antibiotics, Levaquin, for this. Subsequently I did review his intake summary from the hospital as well. I also did look at the CT of the lumbar spine with contrast that was performed which showed no bone destruction to suggest lumbar disguises/osteomyelitis or sacral osteomyelitis. There was no paraspinal abscess. Nonetheless it appears this may have been more of just a soft tissue infection at this point which is good news. He still is nonetheless concerned about the wound which again I think is completely reasonable considering everything he's been through recently. 05/31/18 on evaluation today on evaluation today patient actually  appears to be showing signs of his wound be a little bit deeper than what I would like to see. Fortunately he does not show any signs of significant infection although his temperature was 99 today he states he's been checking this at home and has not been elevated. Nonetheless with the undermining that I'm seeing at this point I am becoming more concerned about the wound I do think that offloading is a key factor here that is preventing the speedy recovery at this point. There does not appear to be any evidence of again over infection noted. He's been using Santyl currently. IMIR, BRUMBACH ([id number]) 06/07/18 the patient presents today for follow-up evaluation regarding the left ulcer in the gluteal region. He has been tolerating the Wound VAC fairly well. He is obviously very frustrated with this he states that to mean is really getting in his way.  There does not appear to be any evidence of infection at this time he does have a little bit of odor I do not necessarily associate this with infection just something that we sometimes notice with Wound VAC therapy. With that being said I can definitely catch a tone of discontentment overall in the patient's demeanor today. This when he was previously in the hospital an CT scan was done of the lumbar region which did not reveal any signs of osteomyelitis. With that being said the pelvis in particular was not evaluated distinctly which means he could still have some osteonecrosis I. Nonetheless the Wound VAC was started on Thursday I do want to get this little bit more time before jumping to a CT scan of the pelvis although that is something that I might would recommend if were not see an improvement by that time. 06/14/18 on evaluation today patient actually appears to be doing about the same in regard to his right gluteal ulcer. Again he did have a CT scan of the lumbar spine unfortunately this did not include the pelvis. Nonetheless with the depth of the wound that I'm seeing today even despite the fact that I'm not seeing any evidence of overt cellulitis I believe there's a good chance that we may be dealing with osteomyelitis somewhere in the right Ischial region. No fevers, chills, nausea, or vomiting noted at this time. 06/21/18 on evaluation today patient actually appears to be doing about the same with regard to his wound. The tunnel at 6 o'clock really does not appear to be any deeper although it is a little bit wider. I think at this point you may want to start packing this with white phone. Unfortunately I have not got approval for the CT scan of the pelvis as of yet due to the fact that Medicare apparently has been denied it due to the diagnosis codes not being appropriate according to Medicare for the test requested. With that being said the patient cannot have an MRI and therefore this  is the only option that we have as far as testing is concerned. The patient has had infection and was on antibiotics and been added code for cellulitis of the bottom to see if this will be appropriate for getting the test approved. Nonetheless I'm concerned about the infection have been spread deeper into the Ischial region. 06/28/18 on evaluation today patient actually appears to be doing rather well all things considered in regard to the right gluteal ulcer. He has been tolerating the dressing changes without complication. With that being said the Wound VAC he states does have to  be replaced almost every day or at least reinforced unfortunately. Patient actually has his CT scan later this morning we should have the results by tomorrow. 07/05/18 on evaluation today patient presents for follow-up concerning his right Ischial ulcer. He did see the surgeon Dr. Lysle Pearl last week. They were actually very happy with him and felt like he spent a tremendous amount of time with them as far as discussing his situation was concerned. In the end Dr. Lysle Pearl did contact me as well and determine that he would not recommend any surgical intervention at this point as he felt like it would not be in the patient's best interest based on what he was seeing. He recommended a referral to infectious disease. Subsequently this is something that Dr. Ines Bloomer office is working on setting up for the patient. As far as evaluation today is concerned the patient's wound actually appears to be worse at this point. I am concerned about how things are progressing and specifically about infection. I do not feel like it's the deeper but the area of depth is definitely widening which does have me concerned. No fevers, chills, nausea, or vomiting noted at this time. I think that we do need initiate antibiotic therapy the patient has an allow allergy to amoxicillin/penicillin he states that he gets a rash since childhood. Nonetheless she's  never had the issues with Catholics or cephalosporins in general but he is aware of. 07/27/18 on evaluation today patient presents following admission to the hospital on 07/09/18. He was subsequently discharged on 07/20/18. On 07/15/18 the patient underwent irrigation and debridement was soft tissue biopsy and bone biopsy as well as placement of a Wound VAC in the OR by Dr. Celine Ahr. During the hospital course the patient was placed on a Wound VAC and recommended follow up with surgery in three weeks actually with Dr. Delaine Lame who is infectious disease. The patient was on vancomycin during the hospital course. 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 He also had a bone culture which revealed evidence of methicillin-resistant staph aureus. He is updated CT scan 07/09/18 reveals that he had progression of the which was performed on wound to breakdown down to the trochanter where he actually had irregularities there as well suggestive of osteomyelitis. This was a change just since 9 December when we last performed a CT scan. Obviously this one had gone downhill quite significantly and rapidly. At this point upon evaluation I feel like in general the patient's wound seems to be doing fairly well all things considered upon my evaluation today. Obviously this is larger and deeper than what I previously evaluated but at the same time he seems to be making some progress as far as the appearance of the granulation tissue is concerned. I'm happy in that regard. No fevers, chills, nausea, or vomiting noted at this time. He is on IV vancomycin and Rocephin at the facility. He is currently in [facility]. 08/03/18 upon evaluation today patient's wound appears to be doing better in regard to the overall appearance at this point in time. Fortunately he's been tolerating the Wound VAC without complication and states that the facility has been taking [name], TABB.  ([id number]) excellent care of the wound site. Overall I see some Slough noted on the surface which I am going to attempt sharp debridement today of but nonetheless other than this I feel like he's making progress. 08/09/18 on evaluation today patient's wound appears to be doing much better compared  to even last week's evaluation. Do believe that the Wound VAC is been of great benefit for him. He has been tolerating the dressing changes that is the Wound VAC without any complication and he has excellent granulation noted currently. There is no need for sharp debridement at this point. 08/16/18 on evaluation today patient actually appears to be doing very well in regard to the wound in the right gluteal fold region. This is showing signs of progress and again appears to be very healthy which is excellent news. Fortunately there is no sign of active infection by way of odor or drainage at this point. Overall I'm very pleased with how things stand. He seems to be tolerating the Wound VAC without complication. 08/23/18 on evaluation today patient actually appears to be doing better in regard to his wound. He has been tolerating the Wound VAC without complication and in fact it has been collecting a significant amount of drainage which I think is good news especially considering how the wound appears. Fortunately there is no signs of infection at this time definitely nothing appears to be worse which is good news. He has not been started on the Bactrim and Flagyl that was recommended by Dr. Delaine Lame yet. I did actually contact her office this morning in order to check and see were things are that regard their gonna be calling me back. 08/30/18 on evaluation today patient actually appears to show signs of excellent improvement today compared to last evaluation. The undermining is getting much better the wound seems to be feeling quite nicely and I'm very pleased that the granulation in general. With that  being said overall I feel like the patient has made excellent progress which is great news. No fevers, chills, nausea, or vomiting noted at this time. 09/06/18 on evaluation today patient actually appears to be doing rather well in regard to his right gluteal ulcer. This is showing signs of improvement in overall I'm very pleased with how things seem to be progressing. The patient likewise is please. Overall I see no evidence of infection he is about to complete his oral antibiotic regimen which is the end of the antibiotics for him in just about three days. 09/13/18 on evaluation today patient's right Ischial ulcer appears to be showing signs of continued improvement which is excellent news. He's been tolerating the dressing changes without complication. Fortunately there's no signs of infection and the wound that seems to be doing very well. 09/28/18 on evaluation today patient appears to be doing rather well in regard to his right Ischial ulcer. He's been tolerating the Wound VAC without complication he knows there's much less drainage than there used to be this obviously is not a bad thing in my pinion. There's no evidence of infection despite the fact is but nothing about it now for several weeks. 10/04/18 on evaluation today patient appears to be doing better in regard to his right Ischial wound. He has been tolerating the Wound VAC without complication and I do believe that the silver nitrate last week was beneficial for him. Fortunately overall there's no evidence of active infection at this time which is great news. No fevers, chills, nausea, or vomiting noted at this time. 10/11/18 on evaluation today patient actually appears to be doing rather well in regard to his Ischial ulcer. He's been tolerating the Wound VAC still without complication I feel like this is doing a good job. No fevers, chills, nausea, or vomiting noted at this time. 11/01/18 on evaluation  today patient presents after  having not been seen in our clinic for several weeks secondary to the fact that he was on evaluation today patient presents after having not been seen in our clinic for several weeks secondary to the fact that he was in a skilled nursing facility which was on lockdown currently due to the covert 19 national emergency. Subsequently he was discharged from the facility on this past Friday and subsequently made an appointment to come in to see yesterday. Fortunately there's no signs of active infection at this time which is good news and overall he does seem to have made progress since I last saw. Overall I feel like things are progressing quite nicely. The patient is having no pain. 11/08/18 on evaluation today patient appears to be doing okay in regard to his right gluteal ulcer. He has been utilizing a Wound VAC home health this changing this at this point since he's home from the skilled nursing facility. Fortunately there's no signs of obvious active infection at this time. Unfortunately though there's no obvious active infection he is having some maceration and his wife states that when the sheets of the Wound VAC office on Sunday when it broke seal that he ended up having significant issues with some smell as well there concerned about the possibility of infection. Fortunately there's No fevers, chills, nausea, or vomiting noted at this time. [name], [name] ([id number]) 11/15/18 on evaluation today patient actually appears to be doing well in regard to his right gluteal ulcer. He has been tolerating the dressing changes without complication. Specifically the Wound VAC has been utilized up to this point. Fortunately there's no signs of infection and overall I feel like he has made progress even since last week when I last saw him. I'm actually fairly happy with the overall appearance although he does seem to have somewhat of a hyper granular overgrowth in the central portion of the wound which  I think may require some sharp debridement to try flatness out possibly utilizing chemical cauterization following. 11/23/18 on evaluation today patient actually appears to be doing very well in regard to his sacral ulcer. He seems to be showing signs of improvement with good granulation. With that being said he still has the small area of hyper granulation right in the central portion of the wound which I'm gonna likely utilize silver nitrate on today. Subsequently he also keeps having a leak at the 6 o'clock location which is unfortunate we may be able to help out with some suggestions to try to prevent this going forward. Fortunately there's no signs of active infection at this time. 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 He's been tolerating the dressing changes without complication. Fortunately there's no signs of active infection at this time. I've been rather pleased with how things have progressed there still some evidence of pressure getting to the area with some redness right around the immediate wound opening. Nonetheless other than this I'm not seeing any significant complications or issues the wound is somewhat hyper granular. Upon discussing with the patient and his wife today I'm not sure that the wound is being packed to the base with the foam at this point. And if it's not been packed fully that may be part of the reason why is not seen as much improvement as far as the granulation from the base out. Again we do not  want pack too tightly but we need some of the firm to get to the base of the wound. I discussed this with patient and his wife today. 12/06/18 on evaluation today patient appears to be doing well in regard to his right gluteal pressure ulcer. He's been tolerating the dressing changes without complication. Fortunately there's no signs of active infection. He still has some  hyper granular tissue and I do think it would be appropriate to continue with the chemical cauterization as of today. 12/16/18 on evaluation today patient actually appears to be doing okay in regard to his right gluteal ulcer. He is been tolerating the dressing changes without complication including the Wound VAC. Overall I feel like nothing seems to be worsening I do feel like that the hyper granulation buds in the central portion of the wound have improved to some degree with the silver nitrate. We will have to see how things continue to progress. 12/20/18 on evaluation today patient actually appears to be doing much worse in my pinion even compared to last week's evaluation. Unfortunately as opposed to showing any signs of improvement the areas of hyper granular tissue in the central portion of the wound seem to be getting worse. Subsequently the wound bed itself also seems to be getting deeper even compared to last week which is both unusual as well as concerning since prior he had been shown signs of improvement. Nonetheless I think that the issue could be that he's actually having some difficulty in issues with a deeper infection. There's no external signs of infection but nonetheless I am more worried about the internal, osteomyelitis, that could be restarting. He has not been on antibiotics for some time at this point. I think that it may be a good idea to go ahead and started back on an antibiotic therapy while we wait to see what the testing shows. 12/27/18 on evaluation today patient presents for follow-up concerning his left gluteal fold wound. Fortunately he appears to be doing well today. I did review the CT scan which was negative for any signs of osteomyelitis or acute abnormality this is excellent news. Overall I feel like the surface of the wound bed appears to be doing significantly better today compared to previously noted findings. There does not appear any signs of infection nor  does he have any pain at this time. Patient History Information obtained from Patient. Family History Hypertension - Father, Stroke - Mother, No family history of Cancer, Diabetes, Heart Disease, Kidney Disease, Lung Disease, Seizures, Thyroid Problems, Tuberculosis. Social History Never smoker, Marital Status - Married, Alcohol Use - Never, Drug Use - No History, Caffeine Use - Daily. Medical History Eyes KLEVER, TWYFORD ([id number]) Patient has history of Cataracts - both removed Denies history of Glaucoma, Optic Neuritis Ear/Nose/Mouth/Throat Denies history of Chronic sinus problems/congestion, Middle ear problems Hematologic/Lymphatic Denies history of Anemia, Hemophilia, Human Immunodeficiency Virus, Lymphedema Respiratory Denies history of Aspiration, Asthma, Chronic Obstructive Pulmonary Disease (COPD), Pneumothorax, Sleep Apnea, Tuberculosis Cardiovascular Patient has history of Hypertension - takes medication Denies history of Angina, Arrhythmia, Congestive Heart Failure, Coronary Artery Disease, Deep Vein Thrombosis, Hypotension, Myocardial Infarction, Peripheral Arterial Disease, Peripheral Venous Disease, Phlebitis, Vasculitis Gastrointestinal Denies history of Cirrhosis , Colitis, Crohn s, Hepatitis A, Hepatitis B, Hepatitis C Endocrine Denies history of Type I Diabetes, Type II Diabetes Genitourinary Denies history of End Stage Renal Disease Immunological Denies history of Lupus Erythematosus, Raynaud s, Scleroderma Integumentary (Skin) Denies history of History of Burn, History of pressure wounds  Musculoskeletal Denies history of Gout, Rheumatoid Arthritis, Osteoarthritis, Osteomyelitis Neurologic Patient has history of Paraplegia - waist down Denies history of Dementia, Neuropathy, Quadriplegia, Seizure Disorder Oncologic Denies history of Received Chemotherapy, Received Radiation Psychiatric Denies history of Anorexia/bulimia, Confinement  Anxiety Medical And Surgical History Notes Oncologic Prostate cancer- currently treated with horomone therapy Review of Systems (ROS) Constitutional Symptoms (General Health) Denies complaints or symptoms of Fatigue, Fever, Chills, Marked Weight Change. Respiratory Denies complaints or symptoms of Chronic or frequent coughs, Shortness of Breath. Cardiovascular Denies complaints or symptoms of Chest pain, LE edema. Psychiatric Denies complaints or symptoms of Anxiety, Claustrophobia. Objective Constitutional Well-nourished and well-hydrated in no acute distress. [name], [name] ([id number]) Vitals Time Taken: 8:15 AM, Height: 73 in, Source: Stated, Weight: 210 lbs, BMI: 27.7, Temperature: 97.8 F, Pulse: 87 bpm, Respiratory Rate: 16 breaths/min, Blood Pressure: 134/74 mmHg. Respiratory normal breathing without difficulty. Psychiatric this patient is able to make decisions and demonstrates good insight into disease process. Alert and Oriented x 3. pleasant and cooperative. General Notes: Patient's wound bed currently showed signs of good granulation which is excellent news and fortunately he does not appear to show any evidence of active infection also good news. I did have to perform sharp agreement to remove hyper granular tissue from the base of the wound to try to help this heal more appropriately and quickly. He actually tolerated the debridement today without complication he did have some bleeding hemostasis was a obtained with use of silver nitrate. Integumentary (Hair, Skin) Wound #1 status is Open. Original cause of wound was Pressure Injury. The wound is located on the Right Gluteal fold. The wound measures 3.5cm length x 3.4cm width x 2.4cm depth; 9.346cm^2 area and 22.431cm^3 volume. There is muscle and Fat Layer (Subcutaneous Tissue) Exposed exposed. There is tunneling at 12:00 with a maximum distance of 4.5cm. There is a large amount of serous drainage noted. The  wound margin is epibole. There is large (67-100%) red, pink, hyper - granulation within the wound bed. There is a small (1-33%) amount of necrotic tissue within the wound bed including Adherent Slough. 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 There was a Excisional Skin/Subcutaneous Tissue Debridement with a total area of 11.9 sq cm performed by [name], HOYT E., [name]. With the following instrument(s): Blade, Forceps, and Scissors to remove Viable and Non-Viable tissue/material. Material removed includes Subcutaneous Tissue after achieving pain control using Lidocaine. No specimens were taken. A time out was conducted at 08:20, prior to the start of the procedure. A Moderate amount of bleeding was controlled with Silver Nitrate. The procedure was tolerated well with a pain level of 0 throughout and a pain level of 0 following the procedure. Post Debridement Measurements: 3.5cm length x 3.4cm width x 2.4cm depth; 22.431cm^3 volume. Post debridement Stage noted as Category/Stage IV. Character of Wound/Ulcer Post Debridement is improved. Post procedure Diagnosis Wound #1: Same as Pre-Procedure PEACE, JOST ([id number]) Plan Wound Cleansing: Wound #1 Right Gluteal fold: Cleanse wound with mild soap and water Anesthetic (add to Medication List): Wound #1 Right Gluteal fold: Topical Lidocaine 4% cream applied to wound bed prior to debridement (In Clinic Only). Primary Wound Dressing: Wound #1 Right Gluteal fold: Pack wound with: - Dakin's 0.25% soaked gauze packed  into the wound bed Secondary Dressing: Wound #1 Right Gluteal fold: ABD pad - Tegaderm to secure Dressing Change  Frequency: Wound #1 Right Gluteal fold: Change dressing every day. Follow-up Appointments: Wound #1 Right Gluteal fold: Return Appointment in 1 week. Off-Loading: Wound #1 Right Gluteal fold: Mattress - Please continue air mattress at SNF Turn and reposition every 2 hours Home Health: Wound #1 Right Gluteal fold: [place] Nurse may visit PRN to address patient s wound care needs. FACE TO FACE ENCOUNTER: MEDICARE and MEDICAID PATIENTS: I certify that this patient is under my care and that I had a face-to-face encounter that meets the physician face-to-face encounter requirements with this patient on this date. The encounter with the patient was in whole or in part for the following MEDICAL CONDITION: (primary reason for [place]) MEDICAL NECESSITY: I certify, that based on my findings, NURSING services are a medically necessary home health service. HOME BOUND STATUS: I certify that my clinical findings support that this patient is homebound (i.e., Due to illness or injury, pt requires aid of supportive devices such as crutches, cane, wheelchairs, walkers, the use of special transportation or the assistance of another person to leave their place of residence. There is a normal inability to leave the home and doing so requires considerable and taxing effort. Other absences are for medical reasons / religious services and are infrequent or of short duration when for other reasons). If current dressing causes regression in wound condition, may D/C ordered dressing product/s and apply Normal Saline Moist Dressing daily until next [place] / Other MD appointment. [place] of regression in wound condition at [phone]. Please direct any NON-WOUND related issues/requests for orders to patient's Primary Care Physician Negative Pressure Wound Therapy: Wound #1 Right Gluteal fold: Discontinue NPWT. My suggestion currently is  gonna be that we continue with the above wound to measures for the next week and the patient is in agreement with plan. We will subsequently see were things stand at follow-up. If anything changes worsens meantime he will contact the office and let me know. TIERNAN, SUTO ([id number]) Please see above for specific wound care orders. We will see patient for re-evaluation in 1 week(s) here in the clinic. If anything worsens or changes patient will contact our office for additional recommendations. Electronic Signature(s) Signed: 12/27/2018 4:26:55 PM By: Worthy Keeler [name] Entered By: Worthy Keeler on 12/27/2018 14:07:50 AADI, BORDNER ([id number]) -------------------------------------------------------------------------------- ROS/PFSH Details Patient Name: [name] Date of Service: 12/27/2018 8:00 AM Medical Record Number: [id number] Patient Account Number: [id number] Date of Birth/Sex: 1951/05/18 (68 y.o. M) Treating RN: Harold Barban Primary Care Provider: [name] Other Clinician: Referring Provider: [name] Treating Provider/Extender: [name], HOYT Weeks in Treatment: 21 Information Obtained From Patient Constitutional Symptoms (General Health) Complaints and Symptoms: Negative for: Fatigue; Fever; Chills; Marked Weight Change Respiratory Complaints and Symptoms: Negative for: Chronic or frequent coughs; Shortness of Breath Medical History: Negative for: Aspiration; Asthma; Chronic Obstructive Pulmonary Disease (COPD); Pneumothorax; Sleep Apnea; Tuberculosis Cardiovascular Complaints and Symptoms: Negative for: Chest pain; LE edema Medical History: Positive for: Hypertension - takes medication Negative for: Angina; Arrhythmia; Congestive Heart Failure; Coronary Artery Disease; Deep Vein Thrombosis; Hypotension; Myocardial Infarction; Peripheral Arterial Disease; Peripheral Venous Disease; Phlebitis;  Vasculitis Psychiatric Complaints and Symptoms: Negative for: Anxiety; Claustrophobia Medical History: Negative for: Anorexia/bulimia; Confinement Anxiety Eyes Medical History: Positive for: Cataracts - both removed Negative for: Glaucoma; Optic Neuritis Ear/Nose/Mouth/Throat Medical History:  Negative for: Chronic sinus problems/congestion; Middle ear problems Hematologic/Lymphatic Medical History: Negative for: Anemia; Hemophilia; Human Immunodeficiency Virus; Lymphedema TAHSIN, BENYO. ([id number]) Gastrointestinal Medical History: Negative for: Cirrhosis ; Colitis; Crohnos; Hepatitis A; Hepatitis B; Hepatitis C Endocrine Medical History: Negative for: Type I Diabetes; Type II Diabetes Genitourinary Medical History: Negative for: End Stage Renal Disease Immunological Medical History: Negative for: Lupus Erythematosus; Raynaudos; Scleroderma Integumentary (Skin) Medical History: Negative for: History of Burn; History of pressure wounds Musculoskeletal Medical History: Negative for: Gout; Rheumatoid Arthritis; Osteoarthritis; Osteomyelitis Neurologic Medical History: Positive for: Paraplegia - waist down Negative for: Dementia; Neuropathy; Quadriplegia; Seizure Disorder Oncologic Medical History: Negative for: Received Chemotherapy; Received Radiation Past Medical History Notes: Prostate cancer- currently treated with horomone therapy HBO Extended History Items Eyes: Cataracts Immunizations Pneumococcal Vaccine: Received Pneumococcal Vaccination: No Implantable Devices No devices added Family and Social History Cancer: No; Diabetes: No; Heart Disease: No; Hypertension: Yes - Father; Kidney Disease: No; Lung Disease: No; Seizures: No; Stroke: Yes - Mother; Thyroid Problems: No; Tuberculosis: No; Never smoker; Marital Status - Married; Alcohol Use: Never; Drug Use: No History; Caffeine Use: Daily PASQUALINO, WITHERSPOON ([id number]) Physician Affirmation I have  reviewed and agree with the above information. Electronic Signature(s) Signed: 12/27/2018 4:26:55 PM By: Worthy Keeler [name] Signed: 12/29/2018 4:18:59 PM By: Harold Barban Entered By: Worthy Keeler on 12/27/2018 08:59:36 DIANA, ARMIJO ([id number]) -------------------------------------------------------------------------------- SuperBill Details Patient Name: [name] Date of Service: 12/27/2018 Medical Record Number: [id number] Patient Account Number: [id number] Date of Birth/Sex: 01-18-1951 (68 y.o. 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

## 2018-12-29 NOTE — Progress Notes (Signed)
[name], [name] ([id number]) Visit Report for 12/27/2018 Arrival Information Details Patient Name: [name], [name] Date of Service: 12/27/2018 8:00 AM Medical Record Number: [id number] Patient Account Number: [id number] Date of Birth/Sex: 01/06/1951 (68 y.o. M) Treating RN: Harold Barban Primary Care Janel Beane: [name] Other Clinician: Referring Sylvana Bonk: [name] Treating Etienne Mowers/Extender: [name], HOYT Weeks in Treatment: 57 Visit Information History Since Last Visit Added or deleted any medications: No Patient Arrived: Wheel Chair Any new allergies or adverse reactions: No Arrival Time: 08:28 Had a fall or experienced change in No Accompanied By: wife activities of daily living that may affect Transfer Assistance: None risk of falls: Patient Requires Transmission-Based No Signs or symptoms of abuse/neglect since last visito No Precautions: Hospitalized since last visit: No Patient Has Alerts: Yes Has Dressing in Place as Prescribed: Yes Patient Alerts: NOT Pain Present Now: Yes Diabetic Electronic Signature(s) Signed: 12/27/2018 4:26:55 PM By: Worthy Keeler [name] Entered By: Worthy Keeler on 12/27/2018 08:29:49 [name], [name] ([id number]) -------------------------------------------------------------------------------- Encounter Discharge Information Details Patient Name: [name] Date of Service: 12/27/2018 8:00 AM Medical Record Number: [id number] Patient Account Number: [id number] Date of Birth/Sex: Nov 11, 1950 (68 y.o. M) Treating RN: Army Melia Primary Care Zuleika Gallus: [name] Other Clinician: Referring Terrah Decoster: [name] Treating Edvin Albus/Extender: [name], HOYT Weeks in Treatment: 25 Encounter Discharge Information Items Post Procedure Vitals Discharge Condition: Stable Temperature (F): 97.6 Ambulatory Status: Wheelchair Pulse (bpm): 87 Discharge Destination: Home Respiratory Rate (breaths/min):  16 Transportation: Private Auto Blood Pressure (mmHg): 134/74 Accompanied By: wife Schedule Follow-up Appointment: No Clinical Summary of Care: Electronic Signature(s) Signed: 12/27/2018 9:19:42 AM By: Army Melia Entered By: Army Melia on 12/27/2018 09:19:42 [name] ([id number]) -------------------------------------------------------------------------------- Lower Extremity Assessment Details Patient Name: [name] Date of Service: 12/27/2018 8:00 AM Medical Record Number: [id number] Patient Account Number: [id number] Date of Birth/Sex: 11-Mar-1951 (68 y.o. M) Treating RN: Harold Barban Primary Care Cleofas Hudgins: [name] Other Clinician: Referring Sana Tessmer: [name] Treating Lizvet Chunn/Extender: [name], HOYT Weeks in Treatment: 56 Electronic Signature(s) Signed: 12/27/2018 4:26:55 PM By: Worthy Keeler [name] Signed: 12/29/2018 4:18:59 PM By: Harold Barban Entered By: Worthy Keeler on 12/27/2018 08:35:01 [name], [name] ([id number]) -------------------------------------------------------------------------------- Multi Wound Chart Details Patient Name: [name] Date of Service: 12/27/2018 8:00 AM Medical Record Number: [id number] Patient Account Number: [id number] Date of Birth/Sex: 03-19-1951 (68 y.o. M) Treating RN: Harold Barban Primary Care Sharaine Delange: [name] Other Clinician: Referring Haelee Bolen: [name] Treating Johnston Maddocks/Extender: [name], HOYT Weeks in Treatment: 56 Vital Signs Height(in): 73 Pulse(bpm): 87 Weight(lbs): 210 Blood Pressure(mmHg): 134/74 Body Mass Index(BMI): 28 Temperature(F): 97.8 Respiratory Rate 16 (breaths/min): Photos: [1:No Photos] [N/A:N/A] Wound Location: [1:Right Gluteal fold] [N/A:N/A] Wounding Event: [1:Pressure Injury] [N/A:N/A] Primary Etiology: [1:Pressure Ulcer] [N/A:N/A] Comorbid History: [1:Cataracts, Hypertension, Paraplegia] [N/A:N/A] Date Acquired:  [1:11/02/2017] [N/A:N/A] Weeks of Treatment: [1:56] [N/A:N/A] Wound Status: [1:Open] [N/A:N/A] Measurements L x W x D [1:3.5x3.4x2.4] [N/A:N/A] (cm) Area (cm) : [1:9.346] [N/A:N/A] Volume (cm) : [1:22.431] [N/A:N/A] % Reduction in Area: [1:7.00%] [N/A:N/A] % Reduction in Volume: [1:-2131.90%] [N/A:N/A] Position 1 (o'clock): [1:12] Maximum Distance 1 (cm): [1:4.5] Tunneling: [1:Yes] [N/A:N/A] Classification: [1:Category/Stage IV] [N/A:N/A] Exudate Amount: [1:Large] [N/A:N/A] Exudate Type: [1:Serous] [N/A:N/A] Exudate Color: [1:amber] [N/A:N/A] Wound Margin: [1:Epibole] [N/A:N/A] Granulation Amount: [1:Large (67-100%)] [N/A:N/A] Granulation Quality: [1:Red, Pink, Hyper-granulation] [N/A:N/A] Necrotic Amount: [1:Small (1-33%)] [N/A:N/A] Exposed Structures: [1:Fat Layer (Subcutaneous Tissue) Exposed: Yes Muscle: Yes Fascia: No Tendon: No Joint: No Bone: No None] [N/A:N/A N/A] Treatment Notes [name], [name] ([id number]) Electronic  Signature(s) Signed: 12/27/2018 4:26:55 PM By: Worthy Keeler [name] Entered By: Worthy Keeler on 12/27/2018 08:35:25 [name], [name] ([id number]) -------------------------------------------------------------------------------- [place] Details Patient Name: [name] Date of Service: 12/27/2018 8:00 AM Medical Record Number: [id number] Patient Account Number: [id number] Date of Birth/Sex: January 31, 1951 (68 y.o. M) Treating RN: Harold Barban Primary Care Cierria Height: [name] Other Clinician: Referring Ziyad Dyar: [name] Treating Alsie Younes/Extender: [name], HOYT Weeks in Treatment: 47 Active Inactive Orientation to the Wound Care Program Nursing Diagnoses: Knowledge deficit related to the wound healing center program Goals: Patient/caregiver will verbalize understanding of the [place] Program Date Initiated: 11/30/2017 Target Resolution Date: 12/21/2017 Goal Status:  Active Interventions: Provide education on orientation to the wound center Notes: Pressure Nursing Diagnoses: Knowledge deficit related to management of pressures ulcers Goals: Patient/caregiver will verbalize understanding of pressure ulcer management Date Initiated: 05/17/2018 Target Resolution Date: 05/28/2018 Goal Status: Active Interventions: Assess: immobility, friction, shearing, incontinence upon admission and as needed Notes: Wound/Skin Impairment Nursing Diagnoses: Impaired tissue integrity Goals: Patient/caregiver will verbalize understanding of skin care regimen Date Initiated: 11/30/2017 Target Resolution Date: 12/21/2017 Goal Status: Active Ulcer/skin breakdown will have a volume reduction of 30% by week 4 Date Initiated: 11/30/2017 Target Resolution Date: 12/21/2017 Goal Status: Active KASHTON, MCARTOR ([id number]) Interventions: Assess patient/caregiver ability to obtain necessary supplies Assess patient/caregiver ability to perform ulcer/skin care regimen upon admission and as needed Assess ulceration(s) every visit Treatment Activities: Skin care regimen initiated : 11/30/2017 Notes: Electronic Signature(s) Signed: 12/27/2018 4:26:55 PM By: Worthy Keeler [name] Signed: 12/29/2018 4:18:59 PM By: Harold Barban Entered By: Worthy Keeler on 12/27/2018 08:35:10 [name], [name] ([id number]) -------------------------------------------------------------------------------- Pain Assessment Details Patient Name: [name] Date of Service: 12/27/2018 8:00 AM Medical Record Number: [id number] Patient Account Number: [id number] Date of Birth/Sex: 1950-08-03 (68 y.o. M) Treating RN: Harold Barban Primary Care Parthenia Tellefsen: [name] Other Clinician: Referring Maylin Freeburg: [name] Treating Aydia Maj/Extender: [name], HOYT Weeks in Treatment: 2 Active Problems Location of Pain Severity and Description of Pain Patient Has Paino Yes Site  Locations Pain Location: Generalized Pain With Dressing Change: No Duration of the Pain. Constant / Intermittento Intermittent Rate the pain. Current Pain Level: 7 Pain Management and Medication Current Pain Management: Electronic Signature(s) Signed: 12/27/2018 4:26:55 PM By: Worthy Keeler [name] Signed: 12/29/2018 4:18:59 PM By: Harold Barban Entered By: Worthy Keeler on 12/27/2018 08:30:06 [name], [name] ([id number]) -------------------------------------------------------------------------------- Patient/Caregiver Education Details Patient Name: [name] Date of Service: 12/27/2018 8:00 AM Medical Record Number: [id number] Patient Account Number: [id number] Date of Birth/Gender: 09/28/1950 (68 y.o. M) Treating RN: Montey Hora Primary Care Physician: [name] Other Clinician: Referring Physician: [name] Treating Physician/Extender: Sharalyn Ink in Treatment: 52 Education Assessment Education Provided To: Patient Education Topics Provided Wound/Skin Impairment: Handouts: Other: wound care as ordered Methods: Demonstration, Explain/Verbal Responses: State content correctly Electronic Signature(s) Signed: 12/27/2018 4:19:49 PM By: Montey Hora Entered By: Montey Hora on 12/27/2018 15:54:20 [name] ([id number]) -------------------------------------------------------------------------------- Wound Assessment Details Patient Name: [name] Date of Service: 12/27/2018 8:00 AM Medical Record Number: [id number] Patient Account Number: [id number] Date of Birth/Sex: Jan 24, 1951 (68 y.o. M) Treating RN: Harold Barban Primary Care Marrion Accomando: [name] Other Clinician: Referring Declyn Delsol: [name] Treating Ladavia Lindenbaum/Extender: [name], HOYT Weeks in Treatment: 56 Wound Status Wound Number: 1 Primary Etiology: Pressure Ulcer Wound Location: Right Gluteal fold Wound Status: Open Wounding  Event: Pressure Injury Comorbid History: Cataracts, Hypertension, Paraplegia Date Acquired: 11/02/2017 [name]  Of Treatment: 56 Clustered Wound: No Photos Photo Uploaded By: Army Melia on 12/27/2018 11:57:04 Wound Measurements Length: (cm) 3.5 Width: (cm) 3.4 Depth: (cm) 2.4 Area: (cm) 9.346 Volume: (cm) 22.431 % Reduction in Area: 7% % Reduction in Volume: -2131.9% Epithelialization: None Tunneling: Yes Position (o'clock): 12 Maximum Distance: (cm) 4.5 Wound Description Classification: Category/Stage IV Wound Margin: Epibole Exudate Amount: Large Exudate Type: Serous Exudate Color: amber Foul Odor After Cleansing: No Slough/Fibrino Yes Wound Bed Granulation Amount: Large (67-100%) Exposed Structure Granulation Quality: Red, Pink, Hyper-granulation Fascia Exposed: No Necrotic Amount: Small (1-33%) Fat Layer (Subcutaneous Tissue) Exposed: Yes Necrotic Quality: Adherent Slough Tendon Exposed: No Muscle Exposed: Yes Necrosis of Muscle: No Joint Exposed: No [name], [name] ([id number]) Bone Exposed: No Treatment Notes Wound #1 (Right Gluteal fold) Notes Dakins wet-to-dry, ABD, Tegaderm Electronic Signature(s) Signed: 12/27/2018 4:26:55 PM By: Worthy Keeler [name] Signed: 12/29/2018 4:18:59 PM By: Harold Barban Entered By: Worthy Keeler on 12/27/2018 08:34:49 [name], [name] ([id number]) -------------------------------------------------------------------------------- Vitals Details Patient Name: [name] Date of Service: 12/27/2018 8:00 AM Medical Record Number: [id number] Patient Account Number: [id number] Date of Birth/Sex: [birth year] (68 y.o. M) Treating RN: Harold Barban Primary Care Jayshawn Colston: [name] Other Clinician: Referring Asiel Chrostowski: [name] Treating Korie Streat/Extender: [name], HOYT Weeks in Treatment: 56 Vital Signs Time Taken: 08:15 Temperature (F): 97.8 Height (in): 73 Pulse (bpm): 87 Source:  Stated Respiratory Rate (breaths/min): 16 Weight (lbs): 210 Blood Pressure (mmHg): 134/74 Body Mass Index (BMI): 27.7 Reference Range: 80 - 120 mg / dl Electronic Signature(s) Signed: 12/27/2018 4:26:55 PM By: Worthy Keeler [name] Entered By: Worthy Keeler on 12/27/2018 08:30:39

## 2019-01-03 ENCOUNTER — Encounter: Payer: Medicare Other | Admitting: Physician Assistant

## 2019-01-03 ENCOUNTER — Other Ambulatory Visit: Payer: Self-pay

## 2019-01-03 DIAGNOSIS — L89314 Pressure ulcer of right buttock, stage 4: Secondary | ICD-10-CM | POA: Diagnosis not present

## 2019-01-05 NOTE — Progress Notes (Signed)
Brett, [name] ([id number]) Visit Report for 01/03/2019 Arrival Information Details Patient Name: [name], [name] Date of Service: 01/03/2019 8:00 AM Medical Record Number: [id number] Patient Account Number: [id number] Date of Birth/Sex: 07/25/1950 (68 y.o. M) Treating RN: [name] Primary Care Rayona Sardinha: [name] Other Clinician: Referring Cylis Ayars: [name] Treating Brodin Gelpi/Extender: [name], HOYT Weeks in Treatment: 31 Visit Information History Since Last Visit Added or deleted any medications: No Patient Arrived: Wheel Chair Any new allergies or adverse reactions: No Arrival Time: 08:07 Had a fall or experienced change in No Accompanied By: wife activities of daily living that may affect Transfer Assistance: Manual risk of falls: Patient Identification Verified: Yes Signs or symptoms of abuse/neglect since last visito No Secondary Verification Process Completed: Yes Hospitalized since last visit: No Patient Requires Transmission-Based No Has Dressing in Place as Prescribed: Yes Precautions: Pain Present Now: Yes Patient Has Alerts: Yes Patient Alerts: NOT Diabetic Electronic Signature(s) Signed: 01/03/2019 4:30:53 PM By: Gretta Cool, BSN, RN, CWS, Kim RN, BSN Entered By: Gretta Cool, BSN, RN, CWS, Kim on 01/03/2019 08:08:08 TRASE, BUNDA ([id number]) -------------------------------------------------------------------------------- Encounter Discharge Information Details Patient Name: [name] Date of Service: 01/03/2019 8:00 AM Medical Record Number: [id number] Patient Account Number: [id number] Date of Birth/Sex: May 12, 1951 (68 y.o. M) Treating RN: Harold Barban Primary Care Agusta Hackenberg: [name] Other Clinician: Referring Topher Buen[place]entura: [name] Treating Marshell Rieger/Extender: [name], HOYT Weeks in Treatment: 83 Encounter Discharge Information Items Post Procedure Vitals Discharge Condition: Stable Temperature (F):  97.8 Ambulatory Status: Wheelchair Pulse (bpm): 77 Discharge Destination: Home Respiratory Rate (breaths/min): 16 Transportation: Private Auto Blood Pressure (mmHg): 136/73 Accompanied By: wife Schedule Follow-up Appointment: Yes Clinical Summary of Care: Electronic Signature(s) Signed: 01/03/2019 3:14:44 PM By: Harold Barban Entered By: Harold Barban on 01/03/2019 09:45:02 [name] ([id number]) -------------------------------------------------------------------------------- Lower Extremity Assessment Details Patient Name: [name] Date of Service: 01/03/2019 8:00 AM Medical Record Number: [id number] Patient Account Number: [id number] Date of Birth/Sex: 10/01/1950 (68 y.o. M) Treating RN: [name] Primary Care Zorana Brockwell: [name] Other Clinician: Referring Krystalyn Kubota: [name] Treating Lexiana Spindel/Extender: Sharalyn Ink in Treatment: 64 Electronic Signature(s) Signed: 01/03/2019 4:30:53 PM By: Gretta Cool, BSN, RN, CWS, Kim RN, BSN Entered By: Gretta Cool, BSN, RN, CWS, Kim on 01/03/2019 08:18:47 JENARO, SOUDER ([id number]) -------------------------------------------------------------------------------- Multi Wound Chart Details Patient Name: [name] Date of Service: 01/03/2019 8:00 AM Medical Record Number: [id number] Patient Account Number: [id number] Date of Birth/Sex: Jul 26, 1950 (68 y.o. M) Treating RN: Harold Barban Primary Care Arlayne Liggins: [name] Other Clinician: Referring Avel Ogawa: [name] Treating Justene Jensen/Extender: [name], HOYT Weeks in Treatment: 73 Vital Signs Height(in): 73 Pulse(bpm): 77 Weight(lbs): 210 Blood Pressure(mmHg): 136/73 Body Mass Index(BMI): 28 Temperature(F): 97.8 Respiratory Rate 16 (breaths/min): Photos: [N/A:N/A] Wound Location: Right Gluteal fold N/A N/A Wounding Event: Pressure Injury N/A N/A Primary Etiology: Pressure Ulcer N/A N/A Comorbid History: Cataracts,  Hypertension, N/A N/A Paraplegia Date Acquired: 11/02/2017 N/A N/A Weeks of Treatment: 57 N/A N/A Wound Status: Open N/A N/A Measurements L x W x D 3.2x2.8x1.4 N/A N/A (cm) Area (cm) : 7.037 N/A N/A Volume (cm) : 9.852 N/A N/A % Reduction in Area: 30.00% N/A N/A % Reduction in Volume: -880.30% N/A N/A Position 1 (o'clock): 5 Maximum Distance 1 (cm): 5.2 Tunneling: Yes N/A N/A Classification: Category/Stage IV N/A N/A Exudate Amount: Large N/A N/A Exudate Type: Serous N/A N/A Exudate Color: amber N/A N/A Wound Margin: Epibole N/A N/A Granulation Amount: Large (67-100%) N/A N/A Granulation Quality: Red, Pink, Hyper-granulation N/A N/A Necrotic Amount: Small (  1-33%) N/A N/A Exposed Structures: Fat Layer (Subcutaneous N/A N/A Tissue) Exposed: Yes Muscle: Yes Fascia: No BUNNIE, LEDERMAN. ([id number]) Tendon: No Joint: No Bone: No Epithelialization: None N/A N/A Treatment Notes Electronic Signature(s) Signed: 01/03/2019 3:14:44 PM By: Harold Barban Entered By: Harold Barban on 01/03/2019 09:25:22 [name] ([id number]) -------------------------------------------------------------------------------- [place] Details Patient Name: [name] Date of Service: 01/03/2019 8:00 AM Medical Record Number: [id number] Patient Account Number: [id number] Date of Birth/Sex: 10/14/1950 (68 y.o. M) Treating RN: Harold Barban Primary Care Leigh Blas: [name] Other Clinician: Referring Tempest Frankland: [name] Treating Caroline Matters/Extender: [name], HOYT Weeks in Treatment: 9 Active Inactive Orientation to the Wound Care Program Nursing Diagnoses: Knowledge deficit related to the wound healing center program Goals: Patient/caregiver will verbalize understanding of the [place] Program Date Initiated: 11/30/2017 Target Resolution Date: 12/21/2017 Goal Status: Active Interventions: Provide education on orientation to  the wound center Notes: Pressure Nursing Diagnoses: Knowledge deficit related to management of pressures ulcers Goals: Patient/caregiver will verbalize understanding of pressure ulcer management Date Initiated: 05/17/2018 Target Resolution Date: 05/28/2018 Goal Status: Active Interventions: Assess: immobility, friction, shearing, incontinence upon admission and as needed Notes: Wound/Skin Impairment Nursing Diagnoses: Impaired tissue integrity Goals: Patient/caregiver will verbalize understanding of skin care regimen Date Initiated: 11/30/2017 Target Resolution Date: 12/21/2017 Goal Status: Active Ulcer/skin breakdown will have a volume reduction of 30% by week 4 Date Initiated: 11/30/2017 Target Resolution Date: 12/21/2017 Goal Status: Active GEORDAN, XU ([id number]) Interventions: Assess patient/caregiver ability to obtain necessary supplies Assess patient/caregiver ability to perform ulcer/skin care regimen upon admission and as needed Assess ulceration(s) every visit Treatment Activities: Skin care regimen initiated : 11/30/2017 Notes: Electronic Signature(s) Signed: 01/03/2019 3:14:44 PM By: Harold Barban Entered By: Harold Barban on 01/03/2019 09:25:07 [name] ([id number]) -------------------------------------------------------------------------------- Pain Assessment Details Patient Name: [name] Date of Service: 01/03/2019 8:00 AM Medical Record Number: [id number] Patient Account Number: [id number] Date of Birth/Sex: 09/23/1950 (68 y.o. M) Treating RN: [name] Primary Care [place]: [name] Other Clinician: Referring Anniebelle Devore: [name] Treating Keyonda Bickle/Extender: [name], HOYT Weeks in Treatment: 6 Active Problems Location of Pain Severity and Description of Pain Patient Has Paino Yes Site Locations Pain Location: Pain in Ulcers With Dressing Change: Yes Duration of the Pain. Constant / Intermittento  Constant Rate the pain. Current Pain Level: 5 Pain Management and Medication Current Pain Management: Electronic Signature(s) Signed: 01/03/2019 4:30:53 PM By: Gretta Cool, BSN, RN, CWS, Kim RN, BSN Entered By: Gretta Cool, BSN, RN, CWS, Kim on 01/03/2019 56:81:27 RAINIER, FEUERBORN ([id number]) -------------------------------------------------------------------------------- Patient/Caregiver Education Details Patient Name: [name] Date of Service: 01/03/2019 8:00 AM Medical Record Number: [id number] Patient Account Number: [id number] Date of Birth/Gender: 15-Mar-1951 (68 y.o. M) Treating RN: Harold Barban Primary Care Physician: [name] Other Clinician: Referring Physician: [name] Treating Physician/Extender: Sharalyn Ink in Treatment: 65 Education Assessment Education Provided To: Patient Education Topics Provided Wound/Skin Impairment: Handouts: Caring for Your Ulcer Methods: Demonstration, Explain/Verbal Responses: State content correctly Electronic Signature(s) Signed: 01/03/2019 3:14:44 PM By: Harold Barban Entered By: Harold Barban on 01/03/2019 09:25:39 [name] ([id number]) -------------------------------------------------------------------------------- Wound Assessment Details Patient Name: [name] Date of Service: 01/03/2019 8:00 AM Medical Record Number: [id number] Patient Account Number: [id number] Date of Birth/Sex: 1950/12/27 (68 y.o. M) Treating RN: Harold Barban Primary Care Addelyn Alleman: [name] Other Clinician: Referring Rayquon Uselman: [name] Treating Mariam Helbert/Extender: [name], HOYT Weeks in Treatment: 74 Wound Status Wound Number: 1 Primary Etiology: Pressure Ulcer Wound Location: Right  Gluteal fold Wound Status: Open Wounding Event: Pressure Injury Comorbid History: Cataracts, Hypertension, Paraplegia Date Acquired: 11/02/2017 Weeks Of Treatment: 57 Clustered Wound:  No Photos Wound Measurements Length: (cm) 3.2 Width: (cm) 2.8 Depth: (cm) 2.4 Area: (cm) 7.037 Volume: (cm) 16.889 % Reduction in Area: 30% % Reduction in Volume: -1580.5% Epithelialization: None Tunneling: Yes Position (o'clock): 5 Maximum Distance: (cm) 5.2 Wound Description Classification: Category/Stage IV Wound Margin: Epibole Exudate Amount: Large Exudate Type: Serous Exudate Color: amber Foul Odor After Cleansing: No Slough/Fibrino Yes Wound Bed Granulation Amount: Large (67-100%) Exposed Structure Granulation Quality: Red, Pink, Hyper-granulation Fascia Exposed: No Necrotic Amount: Small (1-33%) Fat Layer (Subcutaneous Tissue) Exposed: Yes Necrotic Quality: Adherent Slough Tendon Exposed: No Muscle Exposed: Yes Necrosis of Muscle: No Joint Exposed: No Bone Exposed: No Thelander, Elimelech J. ([id number]) Treatment Notes Wound #1 (Right Gluteal fold) Notes Dakins wet-to-dry, ABD, Tegaderm Electronic Signature(s) Signed: 01/03/2019 3:14:44 PM By: Harold Barban Entered By: Harold Barban on 01/03/2019 09:26:33 [name] ([id number]) -------------------------------------------------------------------------------- Vitals Details Patient Name: [name] Date of Service: 01/03/2019 8:00 AM Medical Record Number: [id number] Patient Account Number: [id number] Date of Birth/Sex: 18-Jul-1951 (68 y.o. M) Treating RN: [name] Primary Care Kemp Gomes: [name] Other Clinician: Referring Angelica Wix: [name] Treating Phylliss Strege/Extender: [name], HOYT Weeks in Treatment: 86 Vital Signs Time Taken: 08:05 Temperature (F): 97.8 Height (in): 73 Pulse (bpm): 77 Weight (lbs): 210 Respiratory Rate (breaths/min): 16 Body Mass Index (BMI): 27.7 Blood Pressure (mmHg): 136/73 Reference Range: 80 - 120 mg / dl Electronic Signature(s) Signed: 01/03/2019 4:30:53 PM By: Gretta Cool, BSN, RN, CWS, Kim RN, BSN Entered By: Gretta Cool, BSN, RN, CWS,  Kim on 01/03/2019 08:08:56

## 2019-01-06 NOTE — Progress Notes (Signed)
[name], [name] ([id number]) Visit Report for 01/03/2019 Chief Complaint Document Details Patient Name: [name], [name] Date of Service: 01/03/2019 8:00 AM Medical Record Number: [id number] Patient Account Number: [id number] Date of Birth/Sex: [birth year] (68 y.o. M) Treating RN: Harold Barban Primary Care Provider: [name] Other Clinician: Referring Provider: [name] Treating Provider/Extender: [name], HOYT Weeks in Treatment: 40 Information Obtained from: Patient Chief Complaint Right gluteal fold ulcer Electronic Signature(s) Signed: 01/05/2019 6:29:07 AM By: Worthy Keeler [name] Entered By: Worthy Keeler on 01/03/2019 09:16:55 [name], [name] ([id number]) -------------------------------------------------------------------------------- Debridement Details Patient Name: [name] Date of Service: 01/03/2019 8:00 AM Medical Record Number: [id number] Patient Account Number: [id number] Date of Birth/Sex: September 24, 1950 (68 y.o. M) Treating RN: Harold Barban Primary Care Provider: [name] Other Clinician: Referring Provider: [name] Treating Provider/Extender: [name], HOYT Weeks in Treatment: 49 Debridement Performed for Wound #1 Right Gluteal fold Assessment: Performed By: Physician [name], HOYT E., [name] Debridement Type: Debridement Level of Consciousness (Pre- Awake and Alert procedure): Pre-procedure Verification/Time Yes - 09:30 Out Taken: Start Time: 09:30 Pain Control: Lidocaine Total Area Debrided (L x W): 3.2 (cm) x 2.8 (cm) = 8.96 (cm) Tissue and other material Subcutaneous, Hyper-granulation debrided: Level: Skin/Subcutaneous Tissue Debridement Description: Excisional Instrument: Forceps, Scissors Bleeding: Large Hemostasis Achieved: Pressure End Time: 09:40 Procedural Pain: 0 Post Procedural Pain: 0 Response to Treatment: Procedure was tolerated well Level of Consciousness Awake and  Alert (Post-procedure): Post Debridement Measurements of Total Wound Length: (cm) 3.2 Stage: Category/Stage IV Width: (cm) 2.8 Depth: (cm) 2.4 Volume: (cm) 16.889 Character of Wound/Ulcer Post Improved Debridement: Post Procedure Diagnosis Same as Pre-procedure Electronic Signature(s) Signed: 01/03/2019 3:14:44 PM By: Harold Barban Signed: 01/05/2019 6:29:07 AM By: Worthy Keeler [name] Entered By: Harold Barban on 01/03/2019 09:39:54 [name], [name] ([id number]) -------------------------------------------------------------------------------- HPI Details Patient Name: [name] Date of Service: 01/03/2019 8:00 AM Medical Record Number: [id number] Patient Account Number: [id number] Date of Birth/Sex: December 21, 1950 (68 y.o. 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 He states that there is no history of diabetes that he is aware of. He does have issues with his prostate and is currently receiving treatment for this by way of oral medication. With that being said I do not have a lot of details in that regard. Nonetheless the patient presents today as a result of having been referred to [place] by another provider initially home health was set to come out and take care of his wound although due to the fact that he apparently drives he's not able to receive home health. His wife is therefore trying to help take care of this wound within although they have been struggling with what exactly to do at this point. She states that she can  do some things but she is definitely not a nurse and does have some issues with looking at blood. The good news is the wound does not appear to be too deep and is fairly superficial at this point. There is no slough noted there is some nonviable skin noted around the surface of the wound and the  perimeter at this point. The central portion of the wound appears to be very good with a dermal layer noted this does not appear to be again deep enough to extend it to subcutaneous tissue at this point. 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 In the pump he has Dilaudid and baclofen. 12/07/17 on evaluation today patient presents for follow-up concerning his ongoing lower back thigh ulcer on the right. He states that he did not get the supplies ordered and therefore has not really been able to perform the dressing changes as directed exactly. His wife was able to get some Boarder Foam Dressing's from the drugstore and subsequently has been using hydrogel which did help to a degree in the wound does appear to be able smaller. There is actually more drainage this week noted than previous. 12/21/17 on evaluation today patient appears to be doing rather well in regard to his right gluteal ulcer. He has been tolerating the dressing changes without complication. There does not appear to be any evidence of infection at this point in time. Overall the wound does seem to be making some progress as far as the edges are concerned there's not as much in the way of overlapping of the external wound edges and he has a good epithelium to wound bed border for the most part. This however is not true right at the 12 o'clock location over the span of a little over a centimeters which actually will require debridement today to clean this away and hopefully allow it to continue to heal more appropriately. 12/28/17 on  evaluation today patient appears to be doing rather well in regard to his ulcer in the left gluteal region. He's been tolerating the dressing changes without complication. Apparently he has had some difficulty getting his dressing material. Apparently there's been some confusion with ordering we're gonna check into this. Nonetheless overall he's been showing signs of improvement which is good news. Debridement is not required today. 01/04/18 on evaluation today patient presents for follow-up concerning his right gluteal ulcer. He has been tolerating the dressing changes fairly well. On inspection today it appears he may actually have some maceration them concerned about the fact that he may be developing too much moisture in and around the wound bed which can cause delay in healing. With that being said he unfortunately really has not showed significant signs of improvement since last week's evaluation in fact this may even be just the little bit/slightly larger. Nonetheless he's been having a lot of discomfort I'm not sure this is even related to the wound as he has no pain when I'm to breeding or otherwise cleaning the wound during evaluation today. Nonetheless this is something that we did recommend he talked to his pain specialist concerning. 01/11/18 on evaluation today patient appears to be doing better in regard to his ulceration. He has been tolerating the dressing changes without complication. With that being said overall there's no evidence of infection which is good news. The only thing is he did receive the hatch affair blue classic versus the ready nonetheless I feel like this is perfectly fine and appears to have done well for him over the past week. 01/25/18 on evaluation today patient's wound actually appears to be a little bit larger than during the last evaluation. The good [name], COPELAN. ([id number]) news is the majority of the wound edges actually appear  to be fairly firmly  attached to the wound bed unfortunately again we're not really making progress in regard to the size. Roughly the wound is about the same size as when I first saw him although again the wound margin/edges appear to be much better. 02/01/18 on evaluation today patient actually appears to be doing very well in regard to his wound. Applying the Prisma dry does seem to be better although he does still have issues with slow progression of the wound. There was a slight improvement compared to last week's measurements today. Nonetheless I have been considering other options as far as the possibility of Theraskin or even a snap vac. In general I'm not sure that the Theraskin due to location of the wound would be a very good idea. Nonetheless I do think that a snap vac could be a possibility for the patient and in fact I think this could even be an excellent way to manage the wound possibly seeing some improvement in a very rapid fashion here. Nonetheless this is something that we would need to get approved and I did have a lengthy conversation with the patient about this today. 02/08/18 on evaluation today patient appears to be doing a little better in regard to his ulcer. He has been tolerating the dressing changes without complication. Fortunately despite the fact that the wound is a little bit smaller it's not significantly so unfortunately. We have discussed the possibility of a snap vac we did check with insurance this is actually covered at this point. Fortunately there does not appear to be any sign of infection. Overall I'm fairly pleased with how things seem to be appearing at this point. 02/15/18 on evaluation today patient appears to be doing rather well in regard to his right gluteal ulcer. Unfortunately the snap vac did not stay in place with his sheer and friction this came loose and did not seem to maintain seal very well. He worked for about two days and it did seem to do very well during that  time according to his wife but in general this does not seem to be something that's gonna be beneficial for him long-term. I do believe we need to go back to standard dressings to see if we can find something that will be of benefit. 03/02/18- He is here in follow up evaluation; there is minimal change in the wound. He will continue with the same treatment plan, would consider changing to iodosrob/iodoflex if ulcer continues to to plateau. He will follow up next week 03/08/18 on evaluation today patient's wound actually appears to be about the same size as when I previously saw him several weeks back. Unfortunately he does have some slightly dark discoloration in the central portion of the wound which has me concerned about pressure injury. I do believe he may be sitting for too long a period of time in fact he tells me that "I probably sit for much too long". He does have some Slough noted on the surface of the wound and again as far as the size of the wound is concerned I'm really not seeing anything that seems to have improved significantly. 03/15/18 on evaluation today patient appears to be doing fairly well in regard to his ulcer. The wound measured pretty much about the same today compared to last week's evaluation when looking at his graph. With that being said the area of bruising/deep tissue injury that was noted last week I do not see at this point. He did  get a new cushion fortunately this does seem to be have been of benefit in my pinion. It does appear that he's been off of this more which is good news as well I think that is definitely showing in the overall wound measurements. With that being said I do believe that he needs to continue to offload I don't think that the fact this is doing better should be or is going to allow him to not have to offload and explain this to him as well. Overall he seems to be in agreement the plan I think he understands. The overall appearance of the wound  bed is improved compared to last week I think the Iodoflex has been beneficial in that regard. 03/29/18 on evaluation today patient actually appears to be doing rather well in regard to his wound from the overall appearance standpoint he does have some granulation although there's some Slough on the surface of the wound noted as well. With that being said he unfortunately has not improved in regard to the overall measurement of the wound in volume or in size. I did have a discussion with him very specifically about offloading today. He actually does work although he mainly is just sitting throughout the day. He tells me he offloads by "lifting himself up for 30 seconds off of his chair occasionally" purchase from [employer] which does seem to have helped. And he has a new cushion that he with that being said he's also able to stand some for a very short period of time but not significant enough I think to provide appropriate offloading. I think the biggest issue at this point with the wound and the fact is not healing as quickly as we would like is due to the fact that he is really not able to appropriately offload while at work. He states the beginning after his injury he actually had a bed at his job that he could lay on in order to offload and that does seem to have been of help back at that time. Nonetheless he had not done this in quite some time unfortunately. I think that could be helpful for him this is something I would like for him to look into. 04/05/18 on evaluation today patient actually presents for follow-up concerning his right gluteal ulcer. Again he really is not significantly improved even compared to last week. He has been tolerating the dressing changes without complication. With that being said fortunately there appears to be no evidence of infection at this time. He has been more proactive in trying to offload. [name], [name] ([id number]) 04/12/18 on evaluation today  patient actually appears to be doing a little better in regard to his wound and the right gluteal fold region. He's been tolerating the dressing changes since removing the oasis without complication. However he was having a lot of burning initially with the oasis in place. He's unsure of exactly why this was given so much discomfort but he assumes that it was the oasis itself causing the problem. Nonetheless this had to be removed after about three days in place although even those three days seem to have made a fairly good improvement in regard to the overall appearance of the wound bed. In fact is the first time that he's made any improvement from the standpoint of measurements in about six weeks. He continues to have no discomfort over the area of the wound itself which leads me to wonder why he was having the burning with  the oasis when he does not even feel the actual debridement's themselves. I am somewhat perplexed by this. 04/19/18 on evaluation today patient's wound actually appears to be showing signs of epithelialization around the edge of the wound and in general actually appears to be doing better which is good news. He did have the same burning after about three days with applying the Endoform last week in the same fashion that I would generally apply a skin substitute. This seems to indicate that it's not the oasis to cause the problem but potentially the moisture buildup that just causes things to burn or there may be some other reaction with the skin prep or Steri-Strips. Nonetheless I'm not sure that is gonna be able to tolerate any skin substitute for a long period of time. The good news is the wound actually appears to be doing better today compared to last week and does seem to finally be making some progress. 04/26/18 on evaluation today patient actually appears to be doing rather well in regard to his ulcer in the right gluteal fold. He has been tolerating the dressing changes  without complication which is good news. The Endoform does seem to be helping although he was a little bit more macerated this week. This seems to be an ongoing issue with fluid control at this point. Nonetheless I think we may be able to add something like Drawtex to help control the drainage. 05/03/18 on evaluation today patient appears to actually be doing better in regard to the overall appearance of his wound. He has been tolerating the dressing changes without complication. Fortunately there appears to be no evidence of infection at this time. I really feel like his wound has shown signs as of today of turning around last week I thought so as well and definitely he could be seen in this week's overall appearance and measurements. In general I'm very pleased with the fact that he finally seems to be making a steady but sure progress. The patient likewise is very pleased. 05/17/18 on evaluation today patient appears to be doing more poorly unfortunately in regard to his ulcer. He has been tolerating the dressing changes without complication. With that being said he tells me that in the past couple of days he and his wife have noticed that we did not seem to be doing quite as well is getting dark near the center. Subsequently upon evaluation today the wound actually does appear to be doing worse compared to previous. He has been tolerating the dressing changes otherwise and he states that he is not been sitting up anymore than he was in the past from what he tells me. Still he has continued to work he states "I'm tired of dealing with this and if I have to just go home and lay in the bed all the time that's what I'll do". Nonetheless I am concerned about the fact that this wound does appear to be deeper than what it was previous. 05/24/18 upon evaluation today patient actually presents after having been in the hospital due to what was presumed to be sepsis secondary to the wound infection. He had  an elevated white blood cell count between 14 and 15. With that being said he does seem to be doing somewhat better now. His wound still is giving him some trouble nonetheless and he is obviously concerned about the fact likely talked about that this does seem to go more deeply than previously noted. I did review his wound culture which  showed evidence of Staphylococcus aureus him and group B strep. Nonetheless he is on antibiotics, Levaquin, for this. Subsequently I did review his intake summary from the hospital as well. I also did look at the CT of the lumbar spine with contrast that was performed which showed no bone destruction to suggest lumbar disguises/osteomyelitis or sacral osteomyelitis. There was no paraspinal abscess. Nonetheless it appears this may have been more of just a soft tissue infection at this point which is good news. He still is nonetheless concerned about the wound which again I think is completely reasonable considering everything he's been through recently. 05/31/18 on evaluation today on evaluation today patient actually appears to be showing signs of his wound be a little bit deeper than what I would like to see. Fortunately he does not show any signs of significant infection although his temperature was 99 today he states he's been checking this at home and has not been elevated. Nonetheless with the undermining that I'm seeing at this point I am becoming more concerned about the wound I do think that offloading is a key factor here that is preventing the speedy recovery at this point. There does not appear to be any evidence of again over infection noted. He's been using Santyl currently. 06/07/18 the patient presents today for follow-up evaluation regarding the left ulcer in the gluteal region. He has been tolerating the Wound VAC fairly well. He is obviously very frustrated with this he states that to mean is really getting in his way. There does not appear to be  any evidence of infection at this time he does have a little bit of odor I do not necessarily associate this with infection just something that we sometimes notice with Wound VAC therapy. With that being said I can definitely catch a tone of discontentment overall in the patient's demeanor today. This when he was previously in the hospital [name], [name]. ([id number]) an CT scan was done of the lumbar region which did not reveal any signs of osteomyelitis. With that being said the pelvis in particular was not evaluated distinctly which means he could still have some osteonecrosis I. Nonetheless the Wound VAC was started on Thursday I do want to get this little bit more time before jumping to a CT scan of the pelvis although that is something that I might would recommend if were not see an improvement by that time. 06/14/18 on evaluation today patient actually appears to be doing about the same in regard to his right gluteal ulcer. Again he did have a CT scan of the lumbar spine unfortunately this did not include the pelvis. Nonetheless with the depth of the wound that I'm seeing today even despite the fact that I'm not seeing any evidence of overt cellulitis I believe there's a good chance that we may be dealing with osteomyelitis somewhere in the right Ischial region. No fevers, chills, nausea, or vomiting noted at this time. 06/21/18 on evaluation today patient actually appears to be doing about the same with regard to his wound. The tunnel at 6 o'clock really does not appear to be any deeper although it is a little bit wider. I think at this point you may want to start packing this with white phone. Unfortunately I have not got approval for the CT scan of the pelvis as of yet due to the fact that Medicare apparently has been denied it due to the diagnosis codes not being appropriate according to Medicare for the test requested.  With that being said the patient cannot have an MRI and therefore  this is the only option that we have as far as testing is concerned. The patient has had infection and was on antibiotics and been added code for cellulitis of the bottom to see if this will be appropriate for getting the test approved. Nonetheless I'm concerned about the infection have been spread deeper into the Ischial region. 06/28/18 on evaluation today patient actually appears to be doing rather well all things considered in regard to the right gluteal ulcer. He has been tolerating the dressing changes without complication. With that being said the Wound VAC he states does have to be replaced almost every day or at least reinforced unfortunately. Patient actually has his CT scan later this morning we should have the results by tomorrow. 07/05/18 on evaluation today patient presents for follow-up concerning his right Ischial ulcer. He did see the surgeon Dr. Lysle Pearl last week. They were actually very happy with him and felt like he spent a tremendous amount of time with them as far as discussing his situation was concerned. In the end Dr. Lysle Pearl did contact me as well and determine that he would not recommend any surgical intervention at this point as he felt like it would not be in the patient's best interest based on what he was seeing. He recommended a referral to infectious disease. Subsequently this is something that Dr. Ines Bloomer office is working on setting up for the patient. As far as evaluation today is concerned the patient's wound actually appears to be worse at this point. I am concerned about how things are progressing and specifically about infection. I do not feel like it's the deeper but the area of depth is definitely widening which does have me concerned. No fevers, chills, nausea, or vomiting noted at this time. I think that we do need initiate antibiotic therapy the patient has an allow allergy to amoxicillin/penicillin he states that he gets a rash since childhood. Nonetheless  she's never had the issues with Catholics or cephalosporins in general but he is aware of. 07/27/18 on evaluation today patient presents following admission to the hospital on 07/09/18. He was subsequently discharged on 07/20/18. On 07/15/18 the patient underwent irrigation and debridement was soft tissue biopsy and bone biopsy as well as placement of a Wound VAC in the OR by Dr. Celine Ahr. During the hospital course the patient was placed on a Wound VAC and recommended follow up with surgery in three weeks actually with Dr. Delaine Lame who is infectious disease. The patient was on vancomycin during the hospital course. 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 He also had a bone culture which revealed evidence of methicillin-resistant staph aureus. He is updated CT scan 07/09/18 reveals that he had progression of the which was performed on wound to breakdown down to the trochanter where he actually had irregularities there as well suggestive of osteomyelitis. This was a change just since 9 December when we last performed a CT scan. Obviously this one had gone downhill quite significantly and rapidly. At this point upon evaluation I feel like in general the patient's wound seems to be doing fairly well all things considered upon my evaluation today. Obviously this is larger and deeper than what I previously evaluated but at the same time he seems to be making some progress as far as the appearance of the granulation tissue is concerned. I'm happy in that regard. No fevers, chills, nausea, or  vomiting noted at this time. He is on IV vancomycin and Rocephin at the facility. He is currently in [facility]. 08/03/18 upon evaluation today patient's wound appears to be doing better in regard to the overall appearance at this point in time. Fortunately he's been tolerating the Wound VAC without complication and states that the facility has been taking excellent care of  the wound site. Overall I see some Slough noted on the surface which I am going to attempt sharp debridement today of but nonetheless other than this I feel like he's making progress. 08/09/18 on evaluation today patient's wound appears to be doing much better compared to even last week's evaluation. Do believe that the Wound VAC is been of great benefit for him. He has been tolerating the dressing changes that is the Wound DRAEDYN, WEIDINGER. ([id number]) VAC without any complication and he has excellent granulation noted currently. There is no need for sharp debridement at this point. 08/16/18 on evaluation today patient actually appears to be doing very well in regard to the wound in the right gluteal fold region. This is showing signs of progress and again appears to be very healthy which is excellent news. Fortunately there is no sign of active infection by way of odor or drainage at this point. Overall I'm very pleased with how things stand. He seems to be tolerating the Wound VAC without complication. 08/23/18 on evaluation today patient actually appears to be doing better in regard to his wound. He has been tolerating the Wound VAC without complication and in fact it has been collecting a significant amount of drainage which I think is good news especially considering how the wound appears. Fortunately there is no signs of infection at this time definitely nothing appears to be worse which is good news. He has not been started on the Bactrim and Flagyl that was recommended by Dr. Delaine Lame yet. I did actually contact her office this morning in order to check and see were things are that regard their gonna be calling me back. 08/30/18 on evaluation today patient actually appears to show signs of excellent improvement today compared to last evaluation. The undermining is getting much better the wound seems to be feeling quite nicely and I'm very pleased that the granulation in general. With  that being said overall I feel like the patient has made excellent progress which is great news. No fevers, chills, nausea, or vomiting noted at this time. 09/06/18 on evaluation today patient actually appears to be doing rather well in regard to his right gluteal ulcer. This is showing signs of improvement in overall I'm very pleased with how things seem to be progressing. The patient likewise is please. Overall I see no evidence of infection he is about to complete his oral antibiotic regimen which is the end of the antibiotics for him in just about three days. 09/13/18 on evaluation today patient's right Ischial ulcer appears to be showing signs of continued improvement which is excellent news. He's been tolerating the dressing changes without complication. Fortunately there's no signs of infection and the wound that seems to be doing very well. 09/28/18 on evaluation today patient appears to be doing rather well in regard to his right Ischial ulcer. He's been tolerating the Wound VAC without complication he knows there's much less drainage than there used to be this obviously is not a bad thing in my pinion. There's no evidence of infection despite the fact is but nothing about it now for several weeks.  10/04/18 on evaluation today patient appears to be doing better in regard to his right Ischial wound. He has been tolerating the Wound VAC without complication and I do believe that the silver nitrate last week was beneficial for him. Fortunately overall there's no evidence of active infection at this time which is great news. No fevers, chills, nausea, or vomiting noted at this time. 10/11/18 on evaluation today patient actually appears to be doing rather well in regard to his Ischial ulcer. He's been tolerating the Wound VAC still without complication I feel like this is doing a good job. No fevers, chills, nausea, or vomiting noted at this time. 11/01/18 on evaluation today patient presents after  having not been seen in our clinic for several weeks secondary to the fact that he was on evaluation today patient presents after having not been seen in our clinic for several weeks secondary to the fact that he was in a skilled nursing facility which was on lockdown currently due to the covert 19 national emergency. Subsequently he was discharged from the facility on this past Friday and subsequently made an appointment to come in to see yesterday. Fortunately there's no signs of active infection at this time which is good news and overall he does seem to have made progress since I last saw. Overall I feel like things are progressing quite nicely. The patient is having no pain. 11/08/18 on evaluation today patient appears to be doing okay in regard to his right gluteal ulcer. He has been utilizing a Wound VAC home health this changing this at this point since he's home from the skilled nursing facility. Fortunately there's no signs of obvious active infection at this time. Unfortunately though there's no obvious active infection he is having some maceration and his wife states that when the sheets of the Wound VAC office on Sunday when it broke seal that he ended up having significant issues with some smell as well there concerned about the possibility of infection. Fortunately there's No fevers, chills, nausea, or vomiting noted at this time. 11/15/18 on evaluation today patient actually appears to be doing well in regard to his right gluteal ulcer. He has been tolerating the dressing changes without complication. Specifically the Wound VAC has been utilized up to this point. Fortunately there's no signs of infection and overall I feel like he has made progress even since last week when I last saw him. I'm actually fairly happy with the overall appearance although he does seem to have somewhat of a hyper granular Simien, Jake J. ([id number]) overgrowth in the central portion of the wound which  I think may require some sharp debridement to try flatness out possibly utilizing chemical cauterization following. 11/23/18 on evaluation today patient actually appears to be doing very well in regard to his sacral ulcer. He seems to be showing signs of improvement with good granulation. With that being said he still has the small area of hyper granulation right in the central portion of the wound which I'm gonna likely utilize silver nitrate on today. Subsequently he also keeps having a leak at the 6 o'clock location which is unfortunate we may be able to help out with some suggestions to try to prevent this going forward. Fortunately there's no signs of active infection at this time. 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 He's been tolerating the dressing changes without complication. Fortunately there's no  signs of active infection at this time. I've been rather pleased with how things have progressed there still some evidence of pressure getting to the area with some redness right around the immediate wound opening. Nonetheless other than this I'm not seeing any significant complications or issues the wound is somewhat hyper granular. Upon discussing with the patient and his wife today I'm not sure that the wound is being packed to the base with the foam at this point. And if it's not been packed fully that may be part of the reason why is not seen as much improvement as far as the granulation from the base out. Again we do not want pack too tightly but we need some of the firm to get to the base of the wound. I discussed this with patient and his wife today. 12/06/18 on evaluation today patient appears to be doing well in regard to his right gluteal pressure ulcer. He's been tolerating the dressing changes without complication. Fortunately there's no signs of active infection. He still has some  hyper granular tissue and I do think it would be appropriate to continue with the chemical cauterization as of today. 12/16/18 on evaluation today patient actually appears to be doing okay in regard to his right gluteal ulcer. He is been tolerating the dressing changes without complication including the Wound VAC. Overall I feel like nothing seems to be worsening I do feel like that the hyper granulation buds in the central portion of the wound have improved to some degree with the silver nitrate. We will have to see how things continue to progress. 12/20/18 on evaluation today patient actually appears to be doing much worse in my pinion even compared to last week's evaluation. Unfortunately as opposed to showing any signs of improvement the areas of hyper granular tissue in the central portion of the wound seem to be getting worse. Subsequently the wound bed itself also seems to be getting deeper even compared to last week which is both unusual as well as concerning since prior he had been shown signs of improvement. Nonetheless I think that the issue could be that he's actually having some difficulty in issues with a deeper infection. There's no external signs of infection but nonetheless I am more worried about the internal, osteomyelitis, that could be restarting. He has not been on antibiotics for some time at this point. I think that it may be a good idea to go ahead and started back on an antibiotic therapy while we wait to see what the testing shows. 12/27/18 on evaluation today patient presents for follow-up concerning his left gluteal fold wound. Fortunately he appears to be doing well today. I did review the CT scan which was negative for any signs of osteomyelitis or acute abnormality this is excellent news. Overall I feel like the surface of the wound bed appears to be doing significantly better today compared to previously noted findings. There does not appear any signs of infection nor  does he have any pain at this time. 01/03/19 on evaluation today patient actually appears to be doing quite well in regard to his ulcer. Post debridement last week he really did not have too much bleeding which is good news. Fortunately today this seems to be doing some better but we still has some of the hyper granular tissue noted in the base of the wound which is gonna require sharp debridement today as well. Overall I'm pleased with how things seem to be progressing since we switched  away from the Wound VAC I think he is making some progress. Electronic Signature(s) Signed: 01/05/2019 6:29:07 AM By: Worthy Keeler [name] Entered By: Worthy Keeler on 01/03/2019 23:24:12 [name], [name] ([id number]) -------------------------------------------------------------------------------- Physical Exam Details Patient Name: [name] Date of Service: 01/03/2019 8:00 AM Medical Record Number: [id number] Patient Account Number: [id number] Date of Birth/Sex: [birth year] (68 y.o. M) Treating RN: Harold Barban Primary Care Provider: [name] Other Clinician: Referring Provider: [name] Treating Provider/Extender: [name], HOYT Weeks in Treatment: 41 Constitutional Well-nourished and well-hydrated in no acute distress. Respiratory normal breathing without difficulty. clear to auscultation bilaterally. Cardiovascular regular rate and rhythm with normal S1, S2. Psychiatric this patient is able to make decisions and demonstrates good insight into disease process. Alert and Oriented x 3. pleasant and cooperative. Notes Patient's wound bed again did require sharp debridement today I use scissors and forceps as well as address it clean away what I feel was the majority of the poor granulation tissue which was somewhat hyper granular in the base of the wound. He tolerated this today without complication that was bleeding I was able to get this in the control with the use  of eight silver nitrate sticks nonetheless the bleeding was pretty much ceased by the end of the procedure. Electronic Signature(s) Signed: 01/05/2019 6:29:07 AM By: Worthy Keeler [name] Entered By: Worthy Keeler on 01/03/2019 23:24:41 [name], [name] ([id number]) -------------------------------------------------------------------------------- Physician Orders Details Patient Name: [name] Date of Service: 01/03/2019 8:00 AM Medical Record Number: [id number] Patient Account Number: [id number] Date of Birth/Sex: Jan 29, 1951 (68 y.o. 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 Primary Wound Dressing Wound #1 Right Gluteal fold o Pack wound with: - Dakin's 0.25% soaked gauze packed into the wound bed Secondary Dressing Wound #1 Right Gluteal fold o ABD pad - Tegaderm to secure Dressing Change Frequency Wound #1 Right Gluteal fold o Change dressing every day. Follow-up Appointments Wound #1 Right Gluteal fold o Return Appointment in 1 week. Off-Loading Wound #1 Right Gluteal fold o Turn and reposition every 2 hours Home Health Wound #1 Right Gluteal fold o Home Health Nurse may visit PRN to address patientos wound care needs. SWANSON, FARNELL ([id number]) o  FACE TO FACE ENCOUNTER: MEDICARE and MEDICAID PATIENTS: I certify that this patient is under my care and that I had a face-to-face encounter that meets the physician face-to-face encounter requirements with this patient on this date. The encounter with the patient was in whole or in part for the following MEDICAL CONDITION: (primary reason for [place]) MEDICAL NECESSITY: I certify, that based on my findings, NURSING services are a medically necessary home health service. HOME BOUND STATUS: I certify that my clinical findings support that this patient is homebound (i.e., Due to illness or injury, pt requires aid of supportive devices such as crutches, cane, wheelchairs, walkers, the use of special transportation or the assistance of another  person to leave their place of residence. There is a normal inability to leave the home and doing so requires considerable and taxing effort. Other absences are for medical reasons / religious services and are infrequent or of short duration when for other reasons). o If current dressing causes regression in wound condition, may D/C ordered dressing product/s and apply Normal Saline Moist Dressing daily until next [place] / Other MD appointment. [place] of regression in wound condition at [phone]. o Please direct any NON-WOUND related issues/requests for orders to patient's Primary Care Physician Negative Pressure Wound Therapy Wound #1 Right Gluteal fold o Discontinue NPWT. Electronic Signature(s) Signed: 01/03/2019 3:14:44 PM By: Harold Barban Signed: 01/05/2019 6:29:07 AM By: Worthy Keeler [name] Entered By: Harold Barban on 01/03/2019 09:42:01 [name], [name] ([id number]) -------------------------------------------------------------------------------- Problem List Details Patient Name: [name] Date of Service: 01/03/2019 8:00 AM Medical Record Number: [id number] Patient Account  Number: [id number] Date of Birth/Sex: [birth year] (68 y.o. M) Treating RN: Harold Barban Primary Care Provider: [name] Other Clinician: Referring Provider: [name] Treating Provider/Extender: [name], HOYT Weeks in Treatment: 32 Active Problems ICD-10 Evaluated Encounter Code Description Active Date Today Diagnosis L89.314 Pressure ulcer of right buttock, stage 4 11/30/2017 No Yes L03.317 Cellulitis of buttock 06/21/2018 No Yes G82.20 Paraplegia, unspecified 11/30/2017 No Yes S34.109S Unspecified injury to unspecified level of lumbar spinal cord, 11/30/2017 No Yes sequela I10 Essential (primary) hypertension 11/30/2017 No Yes Inactive Problems Resolved Problems Electronic Signature(s) Signed: 01/05/2019 6:29:07 AM By: Worthy Keeler [name] Entered By: Worthy Keeler on 01/03/2019 09:16:43 Basulto, [name] ([id number]) -------------------------------------------------------------------------------- Progress Note Details Patient Name: [name] Date of Service: 01/03/2019 8:00 AM Medical Record Number: [id number] Patient Account Number: [id number] Date of Birth/Sex: September 06, 1950 (68 y.o. 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 He states that there is no history of diabetes that he is aware of. He does have issues with his prostate and is currently receiving treatment for this by way of oral medication. With that being said I do  not have a lot of details in that regard. Nonetheless the patient presents today as a result of having been referred to [place] by another provider initially home health was set to come out and take care of his wound although due to the fact that he apparently drives he's not able to receive home health. His wife is therefore trying to help take care of this wound within although they have been struggling with what exactly to do at this point. She states that she can do some things but she is definitely not a nurse and does have some issues with looking at blood. The good news is the wound does not appear to be too deep and is fairly superficial at this point. There is no slough noted there is some nonviable skin noted around the surface of the wound and the perimeter at this point. The central portion of the wound appears to be very good with a dermal layer noted this does  not appear to be again deep enough to extend it to subcutaneous tissue at this point. 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 In the pump he has Dilaudid and baclofen. 12/07/17 on evaluation today patient presents for follow-up concerning his ongoing lower back thigh ulcer on the right. He states that he did not get the supplies ordered and therefore has not really been able to perform the dressing changes as directed exactly. His wife was able to get some Boarder Foam Dressing's from the drugstore and subsequently has been using hydrogel which did help to a degree in the wound does appear to be able smaller. There is actually more drainage this week noted than previous. 12/21/17 on evaluation today patient appears to be doing rather well in regard to his right gluteal ulcer. He has been tolerating the dressing changes without complication. There does not appear to be any evidence of infection at this point in time. Overall the  wound does seem to be making some progress as far as the edges are concerned there's not as much in the way of overlapping of the external wound edges and he has a good epithelium to wound bed border for the most part. This however is not true right at the 12 o'clock location over the span of a little over a centimeters which actually will require debridement today to clean this away and hopefully allow it to continue to heal more appropriately. 12/28/17 on evaluation today patient appears to be doing rather well in regard to his ulcer in the left gluteal region. He's been tolerating the dressing changes without complication. Apparently he has had some difficulty getting his dressing material. Apparently there's been some confusion with ordering we're gonna check into this. Nonetheless overall he's been showing signs of improvement which is good news. Debridement is not required today. 01/04/18 on evaluation today patient presents for follow-up concerning his right gluteal ulcer. He has been tolerating the dressing changes fairly well. On inspection today it appears he may actually have some maceration them concerned about the fact that he may be developing too much moisture in and around the wound bed which can cause delay in healing. With that being said he unfortunately really has not showed significant signs of improvement since last week's evaluation in fact this may even be just the little bit/slightly larger. Nonetheless he's been having a lot of discomfort I'm not sure this is even related to the wound as he has no pain when I'm to breeding or otherwise cleaning the wound during evaluation today. Nonetheless this is something that we did recommend he talked to his pain specialist concerning. 01/11/18 on evaluation today patient appears to be doing better in regard to his ulceration. He has been tolerating the dressing Christina, Zelma J. ([id number]) changes without complication. With that  being said overall there's no evidence of infection which is good news. The only thing is he did receive the hatch affair blue classic versus the ready nonetheless I feel like this is perfectly fine and appears to have done well for him over the past week. 01/25/18 on evaluation today patient's wound actually appears to be a little bit larger than during the last evaluation. The good news is the majority of the wound edges actually appear to be fairly firmly attached to the wound bed unfortunately again we're not really making progress in regard to the size.  Roughly the wound is about the same size as when I first saw him although again the wound margin/edges appear to be much better. 02/01/18 on evaluation today patient actually appears to be doing very well in regard to his wound. Applying the Prisma dry does seem to be better although he does still have issues with slow progression of the wound. There was a slight improvement compared to last week's measurements today. Nonetheless I have been considering other options as far as the possibility of Theraskin or even a snap vac. In general I'm not sure that the Theraskin due to location of the wound would be a very good idea. Nonetheless I do think that a snap vac could be a possibility for the patient and in fact I think this could even be an excellent way to manage the wound possibly seeing some improvement in a very rapid fashion here. Nonetheless this is something that we would need to get approved and I did have a lengthy conversation with the patient about this today. 02/08/18 on evaluation today patient appears to be doing a little better in regard to his ulcer. He has been tolerating the dressing changes without complication. Fortunately despite the fact that the wound is a little bit smaller it's not significantly so unfortunately. We have discussed the possibility of a snap vac we did check with insurance this is actually covered at  this point. Fortunately there does not appear to be any sign of infection. Overall I'm fairly pleased with how things seem to be appearing at this point. 02/15/18 on evaluation today patient appears to be doing rather well in regard to his right gluteal ulcer. Unfortunately the snap vac did not stay in place with his sheer and friction this came loose and did not seem to maintain seal very well. He worked for about two days and it did seem to do very well during that time according to his wife but in general this does not seem to be something that's gonna be beneficial for him long-term. I do believe we need to go back to standard dressings to see if we can find something that will be of benefit. 03/02/18- He is here in follow up evaluation; there is minimal change in the wound. He will continue with the same treatment plan, would consider changing to iodosrob/iodoflex if ulcer continues to to plateau. He will follow up next week 03/08/18 on evaluation today patient's wound actually appears to be about the same size as when I previously saw him several weeks back. Unfortunately he does have some slightly dark discoloration in the central portion of the wound which has me concerned about pressure injury. I do believe he may be sitting for too long a period of time in fact he tells me that "I probably sit for much too long". He does have some Slough noted on the surface of the wound and again as far as the size of the wound is concerned I'm really not seeing anything that seems to have improved significantly. 03/15/18 on evaluation today patient appears to be doing fairly well in regard to his ulcer. The wound measured pretty much about the same today compared to last week's evaluation when looking at his graph. With that being said the area of bruising/deep tissue injury that was noted last week I do not see at this point. He did get a new cushion fortunately this does seem to be have been of benefit in  my pinion. It does appear that  he's been off of this more which is good news as well I think that is definitely showing in the overall wound measurements. With that being said I do believe that he needs to continue to offload I don't think that the fact this is doing better should be or is going to allow him to not have to offload and explain this to him as well. Overall he seems to be in agreement the plan I think he understands. The overall appearance of the wound bed is improved compared to last week I think the Iodoflex has been beneficial in that regard. 03/29/18 on evaluation today patient actually appears to be doing rather well in regard to his wound from the overall appearance standpoint he does have some granulation although there's some Slough on the surface of the wound noted as well. With that being said he unfortunately has not improved in regard to the overall measurement of the wound in volume or in size. I did have a discussion with him very specifically about offloading today. He actually does work although he mainly is just sitting throughout the day. He tells me he offloads by "lifting himself up for 30 seconds off of his chair occasionally" purchase from [employer] which does seem to have helped. And he has a new cushion that he with that being said he's also able to stand some for a very short period of time but not significant enough I think to provide appropriate offloading. I think the biggest issue at this point with the wound and the fact is not healing as quickly as we would like is due to the fact that he is really not able to appropriately offload while at work. He states the beginning after his injury he actually had a bed at his job that he could lay on in order to offload and that does seem to have been of help back at that time. Nonetheless he had not done this in quite some time unfortunately. I think that could be helpful for him this is something I would like  for him to look into. RIYAAN, HEROUX ([id number]) 04/05/18 on evaluation today patient actually presents for follow-up concerning his right gluteal ulcer. Again he really is not significantly improved even compared to last week. He has been tolerating the dressing changes without complication. With that being said fortunately there appears to be no evidence of infection at this time. He has been more proactive in trying to offload. 04/12/18 on evaluation today patient actually appears to be doing a little better in regard to his wound and the right gluteal fold region. He's been tolerating the dressing changes since removing the oasis without complication. However he was having a lot of burning initially with the oasis in place. He's unsure of exactly why this was given so much discomfort but he assumes that it was the oasis itself causing the problem. Nonetheless this had to be removed after about three days in place although even those three days seem to have made a fairly good improvement in regard to the overall appearance of the wound bed. In fact is the first time that he's made any improvement from the standpoint of measurements in about six weeks. He continues to have no discomfort over the area of the wound itself which leads me to wonder why he was having the burning with the oasis when he does not even feel the actual debridement's themselves. I am somewhat perplexed by this. 04/19/18 on evaluation today  patient's wound actually appears to be showing signs of epithelialization around the edge of the wound and in general actually appears to be doing better which is good news. He did have the same burning after about three days with applying the Endoform last week in the same fashion that I would generally apply a skin substitute. This seems to indicate that it's not the oasis to cause the problem but potentially the moisture buildup that just causes things to burn or there may be some  other reaction with the skin prep or Steri-Strips. Nonetheless I'm not sure that is gonna be able to tolerate any skin substitute for a long period of time. The good news is the wound actually appears to be doing better today compared to last week and does seem to finally be making some progress. 04/26/18 on evaluation today patient actually appears to be doing rather well in regard to his ulcer in the right gluteal fold. He has been tolerating the dressing changes without complication which is good news. The Endoform does seem to be helping although he was a little bit more macerated this week. This seems to be an ongoing issue with fluid control at this point. Nonetheless I think we may be able to add something like Drawtex to help control the drainage. 05/03/18 on evaluation today patient appears to actually be doing better in regard to the overall appearance of his wound. He has been tolerating the dressing changes without complication. Fortunately there appears to be no evidence of infection at this time. I really feel like his wound has shown signs as of today of turning around last week I thought so as well and definitely he could be seen in this week's overall appearance and measurements. In general I'm very pleased with the fact that he finally seems to be making a steady but sure progress. The patient likewise is very pleased. 05/17/18 on evaluation today patient appears to be doing more poorly unfortunately in regard to his ulcer. He has been tolerating the dressing changes without complication. With that being said he tells me that in the past couple of days he and his wife have noticed that we did not seem to be doing quite as well is getting dark near the center. Subsequently upon evaluation today the wound actually does appear to be doing worse compared to previous. He has been tolerating the dressing changes otherwise and he states that he is not been sitting up anymore than he was in  the past from what he tells me. Still he has continued to work he states "I'm tired of dealing with this and if I have to just go home and lay in the bed all the time that's what I'll do". Nonetheless I am concerned about the fact that this wound does appear to be deeper than what it was previous. 05/24/18 upon evaluation today patient actually presents after having been in the hospital due to what was presumed to be sepsis secondary to the wound infection. He had an elevated white blood cell count between 14 and 15. With that being said he does seem to be doing somewhat better now. His wound still is giving him some trouble nonetheless and he is obviously concerned about the fact likely talked about that this does seem to go more deeply than previously noted. I did review his wound culture which showed evidence of Staphylococcus aureus him and group B strep. Nonetheless he is on antibiotics, Levaquin, for this. Subsequently I did review  his intake summary from the hospital as well. I also did look at the CT of the lumbar spine with contrast that was performed which showed no bone destruction to suggest lumbar disguises/osteomyelitis or sacral osteomyelitis. There was no paraspinal abscess. Nonetheless it appears this may have been more of just a soft tissue infection at this point which is good news. He still is nonetheless concerned about the wound which again I think is completely reasonable considering everything he's been through recently. 05/31/18 on evaluation today on evaluation today patient actually appears to be showing signs of his wound be a little bit deeper than what I would like to see. Fortunately he does not show any signs of significant infection although his temperature was 99 today he states he's been checking this at home and has not been elevated. Nonetheless with the undermining that I'm seeing at this point I am becoming more concerned about the wound I do think that  offloading is a key factor here that is preventing the speedy recovery at this point. There does not appear to be any evidence of again over infection noted. He's been using Santyl currently. JOHNATHON, OLDEN ([id number]) 06/07/18 the patient presents today for follow-up evaluation regarding the left ulcer in the gluteal region. He has been tolerating the Wound VAC fairly well. He is obviously very frustrated with this he states that to mean is really getting in his way. There does not appear to be any evidence of infection at this time he does have a little bit of odor I do not necessarily associate this with infection just something that we sometimes notice with Wound VAC therapy. With that being said I can definitely catch a tone of discontentment overall in the patient's demeanor today. This when he was previously in the hospital an CT scan was done of the lumbar region which did not reveal any signs of osteomyelitis. With that being said the pelvis in particular was not evaluated distinctly which means he could still have some osteonecrosis I. Nonetheless the Wound VAC was started on Thursday I do want to get this little bit more time before jumping to a CT scan of the pelvis although that is something that I might would recommend if were not see an improvement by that time. 06/14/18 on evaluation today patient actually appears to be doing about the same in regard to his right gluteal ulcer. Again he did have a CT scan of the lumbar spine unfortunately this did not include the pelvis. Nonetheless with the depth of the wound that I'm seeing today even despite the fact that I'm not seeing any evidence of overt cellulitis I believe there's a good chance that we may be dealing with osteomyelitis somewhere in the right Ischial region. No fevers, chills, nausea, or vomiting noted at this time. 06/21/18 on evaluation today patient actually appears to be doing about the same with regard to his  wound. The tunnel at 6 o'clock really does not appear to be any deeper although it is a little bit wider. I think at this point you may want to start packing this with white phone. Unfortunately I have not got approval for the CT scan of the pelvis as of yet due to the fact that Medicare apparently has been denied it due to the diagnosis codes not being appropriate according to Medicare for the test requested. With that being said the patient cannot have an MRI and therefore this is the only option that we have as  far as testing is concerned. The patient has had infection and was on antibiotics and been added code for cellulitis of the bottom to see if this will be appropriate for getting the test approved. Nonetheless I'm concerned about the infection have been spread deeper into the Ischial region. 06/28/18 on evaluation today patient actually appears to be doing rather well all things considered in regard to the right gluteal ulcer. He has been tolerating the dressing changes without complication. With that being said the Wound VAC he states does have to be replaced almost every day or at least reinforced unfortunately. Patient actually has his CT scan later this morning we should have the results by tomorrow. 07/05/18 on evaluation today patient presents for follow-up concerning his right Ischial ulcer. He did see the surgeon Dr. Lysle Pearl last week. They were actually very happy with him and felt like he spent a tremendous amount of time with them as far as discussing his situation was concerned. In the end Dr. Lysle Pearl did contact me as well and determine that he would not recommend any surgical intervention at this point as he felt like it would not be in the patient's best interest based on what he was seeing. He recommended a referral to infectious disease. Subsequently this is something that Dr. Ines Bloomer office is working on setting up for the patient. As far as evaluation today is concerned the  patient's wound actually appears to be worse at this point. I am concerned about how things are progressing and specifically about infection. I do not feel like it's the deeper but the area of depth is definitely widening which does have me concerned. No fevers, chills, nausea, or vomiting noted at this time. I think that we do need initiate antibiotic therapy the patient has an allow allergy to amoxicillin/penicillin he states that he gets a rash since childhood. Nonetheless she's never had the issues with Catholics or cephalosporins in general but he is aware of. 07/27/18 on evaluation today patient presents following admission to the hospital on 07/09/18. He was subsequently discharged on 07/20/18. On 07/15/18 the patient underwent irrigation and debridement was soft tissue biopsy and bone biopsy as well as placement of a Wound VAC in the OR by Dr. Celine Ahr. During the hospital course the patient was placed on a Wound VAC and recommended follow up with surgery in three weeks actually with Dr. Delaine Lame who is infectious disease. The patient was on vancomycin during the hospital course. 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 He also had a bone culture which revealed evidence of methicillin-resistant staph aureus. He is updated CT scan 07/09/18 reveals that he had progression of the which was performed on wound to breakdown down to the trochanter where he actually had irregularities there as well suggestive of osteomyelitis. This was a change just since 9 December when we last performed a CT scan. Obviously this one had gone downhill quite significantly and rapidly. At this point upon evaluation I feel like in general the patient's wound seems to be doing fairly well all things considered upon my evaluation today. Obviously this is larger and deeper than what I previously evaluated but at the same time he seems to be making some progress as far as the appearance of  the granulation tissue is concerned. I'm happy in that regard. No fevers, chills, nausea, or vomiting noted at this time. He is on IV vancomycin and Rocephin at the facility. He is currently in [facility].  08/03/18 upon evaluation today patient's wound appears to be doing better in regard to the overall appearance at this point in time. Fortunately he's been tolerating the Wound VAC without complication and states that the facility has been taking ATHANASIUS, [name]. ([id number]) excellent care of the wound site. Overall I see some Slough noted on the surface which I am going to attempt sharp debridement today of but nonetheless other than this I feel like he's making progress. 08/09/18 on evaluation today patient's wound appears to be doing much better compared to even last week's evaluation. Do believe that the Wound VAC is been of great benefit for him. He has been tolerating the dressing changes that is the Wound VAC without any complication and he has excellent granulation noted currently. There is no need for sharp debridement at this point. 08/16/18 on evaluation today patient actually appears to be doing very well in regard to the wound in the right gluteal fold region. This is showing signs of progress and again appears to be very healthy which is excellent news. Fortunately there is no sign of active infection by way of odor or drainage at this point. Overall I'm very pleased with how things stand. He seems to be tolerating the Wound VAC without complication. 08/23/18 on evaluation today patient actually appears to be doing better in regard to his wound. He has been tolerating the Wound VAC without complication and in fact it has been collecting a significant amount of drainage which I think is good news especially considering how the wound appears. Fortunately there is no signs of infection at this time definitely nothing appears to be worse which is good news. He has not been  started on the Bactrim and Flagyl that was recommended by Dr. Delaine Lame yet. I did actually contact her office this morning in order to check and see were things are that regard their gonna be calling me back. 08/30/18 on evaluation today patient actually appears to show signs of excellent improvement today compared to last evaluation. The undermining is getting much better the wound seems to be feeling quite nicely and I'm very pleased that the granulation in general. With that being said overall I feel like the patient has made excellent progress which is great news. No fevers, chills, nausea, or vomiting noted at this time. 09/06/18 on evaluation today patient actually appears to be doing rather well in regard to his right gluteal ulcer. This is showing signs of improvement in overall I'm very pleased with how things seem to be progressing. The patient likewise is please. Overall I see no evidence of infection he is about to complete his oral antibiotic regimen which is the end of the antibiotics for him in just about three days. 09/13/18 on evaluation today patient's right Ischial ulcer appears to be showing signs of continued improvement which is excellent news. He's been tolerating the dressing changes without complication. Fortunately there's no signs of infection and the wound that seems to be doing very well. 09/28/18 on evaluation today patient appears to be doing rather well in regard to his right Ischial ulcer. He's been tolerating the Wound VAC without complication he knows there's much less drainage than there used to be this obviously is not a bad thing in my pinion. There's no evidence of infection despite the fact is but nothing about it now for several weeks. 10/04/18 on evaluation today patient appears to be doing better in regard to his right Ischial wound. He has been tolerating the  Wound VAC without complication and I do believe that the silver nitrate last week was beneficial  for him. Fortunately overall there's no evidence of active infection at this time which is great news. No fevers, chills, nausea, or vomiting noted at this time. 10/11/18 on evaluation today patient actually appears to be doing rather well in regard to his Ischial ulcer. He's been tolerating the Wound VAC still without complication I feel like this is doing a good job. No fevers, chills, nausea, or vomiting noted at this time. 11/01/18 on evaluation today patient presents after having not been seen in our clinic for several weeks secondary to the fact that he was on evaluation today patient presents after having not been seen in our clinic for several weeks secondary to the fact that he was in a skilled nursing facility which was on lockdown currently due to the covert 19 national emergency. Subsequently he was discharged from the facility on this past Friday and subsequently made an appointment to come in to see yesterday. Fortunately there's no signs of active infection at this time which is good news and overall he does seem to have made progress since I last saw. Overall I feel like things are progressing quite nicely. The patient is having no pain. 11/08/18 on evaluation today patient appears to be doing okay in regard to his right gluteal ulcer. He has been utilizing a Wound VAC home health this changing this at this point since he's home from the skilled nursing facility. Fortunately there's no signs of obvious active infection at this time. Unfortunately though there's no obvious active infection he is having some maceration and his wife states that when the sheets of the Wound VAC office on Sunday when it broke seal that he ended up having significant issues with some smell as well there concerned about the possibility of infection. Fortunately there's No fevers, chills, nausea, or vomiting noted at this time. MACSEN, NUTTALL ([id number]) 11/15/18 on evaluation today patient actually  appears to be doing well in regard to his right gluteal ulcer. He has been tolerating the dressing changes without complication. Specifically the Wound VAC has been utilized up to this point. Fortunately there's no signs of infection and overall I feel like he has made progress even since last week when I last saw him. I'm actually fairly happy with the overall appearance although he does seem to have somewhat of a hyper granular overgrowth in the central portion of the wound which I think may require some sharp debridement to try flatness out possibly utilizing chemical cauterization following. 11/23/18 on evaluation today patient actually appears to be doing very well in regard to his sacral ulcer. He seems to be showing signs of improvement with good granulation. With that being said he still has the small area of hyper granulation right in the central portion of the wound which I'm gonna likely utilize silver nitrate on today. Subsequently he also keeps having a leak at the 6 o'clock location which is unfortunate we may be able to help out with some suggestions to try to prevent this going forward. Fortunately there's no signs of active infection at this time. 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 He's been tolerating the dressing changes without complication. Fortunately there's no signs of active infection at this time. I've been rather pleased with how things have progressed there still some evidence of pressure  getting to the area with some redness right around the immediate wound opening. Nonetheless other than this I'm not seeing any significant complications or issues the wound is somewhat hyper granular. Upon discussing with the patient and his wife today I'm not sure that the wound is being packed to the base with the foam at this point. And if it's not been packed fully that may be  part of the reason why is not seen as much improvement as far as the granulation from the base out. Again we do not want pack too tightly but we need some of the firm to get to the base of the wound. I discussed this with patient and his wife today. 12/06/18 on evaluation today patient appears to be doing well in regard to his right gluteal pressure ulcer. He's been tolerating the dressing changes without complication. Fortunately there's no signs of active infection. He still has some hyper granular tissue and I do think it would be appropriate to continue with the chemical cauterization as of today. 12/16/18 on evaluation today patient actually appears to be doing okay in regard to his right gluteal ulcer. He is been tolerating the dressing changes without complication including the Wound VAC. Overall I feel like nothing seems to be worsening I do feel like that the hyper granulation buds in the central portion of the wound have improved to some degree with the silver nitrate. We will have to see how things continue to progress. 12/20/18 on evaluation today patient actually appears to be doing much worse in my pinion even compared to last week's evaluation. Unfortunately as opposed to showing any signs of improvement the areas of hyper granular tissue in the central portion of the wound seem to be getting worse. Subsequently the wound bed itself also seems to be getting deeper even compared to last week which is both unusual as well as concerning since prior he had been shown signs of improvement. Nonetheless I think that the issue could be that he's actually having some difficulty in issues with a deeper infection. There's no external signs of infection but nonetheless I am more worried about the internal, osteomyelitis, that could be restarting. He has not been on antibiotics for some time at this point. I think that it may be a good idea to go ahead and started back on an antibiotic therapy while  we wait to see what the testing shows. 12/27/18 on evaluation today patient presents for follow-up concerning his left gluteal fold wound. Fortunately he appears to be doing well today. I did review the CT scan which was negative for any signs of osteomyelitis or acute abnormality this is excellent news. Overall I feel like the surface of the wound bed appears to be doing significantly better today compared to previously noted findings. There does not appear any signs of infection nor does he have any pain at this time. 01/03/19 on evaluation today patient actually appears to be doing quite well in regard to his ulcer. Post debridement last week he really did not have too much bleeding which is good news. Fortunately today this seems to be doing some better but we still has some of the hyper granular tissue noted in the base of the wound which is gonna require sharp debridement today as well. Overall I'm pleased with how things seem to be progressing since we switched away from the Wound VAC I think he is making some progress. Patient History Information obtained from Patient. Family History Hypertension -  Father, Stroke - Mother, No family history of Cancer, Diabetes, Heart Disease, Kidney Disease, Lung Disease, Seizures, Thyroid Problems, Tuberculosis. ARGIL, MAHL ([id number]) Social History Never smoker, Marital Status - Married, Alcohol Use - Never, Drug Use - No History, Caffeine Use - Daily. Medical History Eyes Patient has history of Cataracts - both removed Denies history of Glaucoma, Optic Neuritis Ear/Nose/Mouth/Throat Denies history of Chronic sinus problems/congestion, Middle ear problems Hematologic/Lymphatic Denies history of Anemia, Hemophilia, Human Immunodeficiency Virus, Lymphedema Respiratory Denies history of Aspiration, Asthma, Chronic Obstructive Pulmonary Disease (COPD), Pneumothorax, Sleep Apnea, Tuberculosis Cardiovascular Patient has history of  Hypertension - takes medication Denies history of Angina, Arrhythmia, Congestive Heart Failure, Coronary Artery Disease, Deep Vein Thrombosis, Hypotension, Myocardial Infarction, Peripheral Arterial Disease, Peripheral Venous Disease, Phlebitis, Vasculitis Gastrointestinal Denies history of Cirrhosis , Colitis, Crohn s, Hepatitis A, Hepatitis B, Hepatitis C Endocrine Denies history of Type I Diabetes, Type II Diabetes Genitourinary Denies history of End Stage Renal Disease Immunological Denies history of Lupus Erythematosus, Raynaud s, Scleroderma Integumentary (Skin) Denies history of History of Burn, History of pressure wounds Musculoskeletal Denies history of Gout, Rheumatoid Arthritis, Osteoarthritis, Osteomyelitis Neurologic Patient has history of Paraplegia - waist down Denies history of Dementia, Neuropathy, Quadriplegia, Seizure Disorder Oncologic Denies history of Received Chemotherapy, Received Radiation Psychiatric Denies history of Anorexia/bulimia, Confinement Anxiety Medical And Surgical History Notes Oncologic Prostate cancer- currently treated with horomone therapy Review of Systems (ROS) Constitutional Symptoms (General Health) Denies complaints or symptoms of Fatigue, Fever, Chills, Marked Weight Change. Respiratory Denies complaints or symptoms of Chronic or frequent coughs, Shortness of Breath. Cardiovascular Denies complaints or symptoms of Chest pain, LE edema. Psychiatric Denies complaints or symptoms of Anxiety, Claustrophobia. GURVEER, COLUCCI ([id number]) Objective Constitutional Well-nourished and well-hydrated in no acute distress. Vitals Time Taken: 8:05 AM, Height: 73 in, Weight: 210 lbs, BMI: 27.7, Temperature: 97.8 F, Pulse: 77 bpm, Respiratory Rate: 16 breaths/min, Blood Pressure: 136/73 mmHg. Respiratory normal breathing without difficulty. clear to auscultation bilaterally. Cardiovascular regular rate and rhythm with normal S1,  S2. Psychiatric this patient is able to make decisions and demonstrates good insight into disease process. Alert and Oriented x 3. pleasant and cooperative. General Notes: Patient's wound bed again did require sharp debridement today I use scissors and forceps as well as address it clean away what I feel was the majority of the poor granulation tissue which was somewhat hyper granular in the base of the wound. He tolerated this today without complication that was bleeding I was able to get this in the control with the use of eight silver nitrate sticks nonetheless the bleeding was pretty much ceased by the end of the procedure. Integumentary (Hair, Skin) Wound #1 status is Open. Original cause of wound was Pressure Injury. The wound is located on the Right Gluteal fold. The wound measures 3.2cm length x 2.8cm width x 2.4cm depth; 7.037cm^2 area and 16.889cm^3 volume. There is muscle and Fat Layer (Subcutaneous Tissue) Exposed exposed. There is tunneling at 5:00 with a maximum distance of 5.2cm. There is a large amount of serous drainage noted. The wound margin is epibole. There is large (67-100%) red, pink, hyper - granulation within the wound bed. There is a small (1-33%) amount of necrotic tissue within the wound bed including Adherent Slough. 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 There was a Excisional Skin/Subcutaneous Tissue Debridement with a total area of 8.96 sq cm performed by [name], HOYT E., [name]. With the following instrument(s): Forceps, and Scissors Material removed includes Subcutaneous Tissue and Hyper-granulation and [name], [name]. ([id number]) after achieving pain  control using Lidocaine. A time out was conducted at 09:30, prior to the start of the procedure. A Large amount of bleeding was controlled with Pressure. The procedure was tolerated well with a pain level of 0 throughout and a pain level of 0 following the procedure. Post Debridement Measurements: 3.2cm length x 2.8cm width x 2.4cm depth; 16.889cm^3 volume. Post debridement Stage noted as Category/Stage IV. Character of Wound/Ulcer Post Debridement is improved. Post procedure Diagnosis Wound #1: Same as Pre-Procedure Plan Wound Cleansing: Wound #1 Right Gluteal fold: Cleanse wound with mild soap and water Anesthetic (add to Medication List): Wound #1 Right Gluteal fold: Topical Lidocaine 4% cream applied to wound bed prior to debridement (In Clinic Only). Primary Wound Dressing: Wound #1 Right Gluteal fold: Pack wound with: - Dakin's 0.25% soaked gauze packed into the wound bed Secondary Dressing: Wound #1 Right Gluteal fold: ABD pad - Tegaderm to secure Dressing Change Frequency: Wound #1 Right Gluteal fold: Change dressing every day. Follow-up Appointments: Wound #1 Right Gluteal fold: Return Appointment in 1 week. Off-Loading: Wound #1 Right Gluteal fold: Turn and reposition every 2 hours Home Health: Wound #1 Right Gluteal fold: Home Health Nurse may visit PRN to address patient s wound care needs. FACE TO FACE ENCOUNTER: MEDICARE and MEDICAID PATIENTS: I certify that this patient is under my care and that I had a face-to-face encounter that meets the physician face-to-face encounter requirements with this patient on this date. The encounter with the patient was in whole or in part for the following MEDICAL CONDITION: (primary reason for [place]) MEDICAL NECESSITY: I certify, that based on my findings, NURSING services are a medically necessary home health service. HOME BOUND STATUS: I certify that my clinical findings support that this patient is homebound (i.e., Due  to illness or injury, pt requires aid of supportive devices such as crutches, cane, wheelchairs, walkers, the use of special transportation or the assistance of another person to leave their place of residence. There is a normal inability to leave the home and doing so requires considerable and taxing effort. Other absences are for medical reasons / religious services and are infrequent or of short duration when for other reasons). If current dressing causes regression in wound condition, may D/C ordered dressing product/s and apply Normal Saline Moist Dressing daily until next [place] / Other MD appointment. [place] of regression in wound condition at [phone]. Please direct any NON-WOUND related issues/requests for orders to patient's Primary Care Physician Negative Pressure Wound Therapy: Wound #1 Right Gluteal fold: Discontinue NPWT. TELL, ROZELLE ([id number]) My session currently is gonna be that we continue with the above wound care measures for the next week and the patient is in agreement that plan. We will subsequently see were things that at follow-up. If anything changes or worsens meantime he will contact the office let me know. Please see above for specific wound care orders. We will see patient for re-evaluation in 1 week(s) here in the clinic. If anything worsens or changes patient will contact our office for additional recommendations. Electronic Signature(s) Signed: 01/05/2019 6:29:07 AM By: Worthy Keeler [name] Entered By: Worthy Keeler on 01/03/2019 23:24:53 Knaus,  CHIDUBEM CHAIRES ([id number]) -------------------------------------------------------------------------------- ROS/PFSH Details Patient Name: CHILTON, SALLADE Date of Service: 01/03/2019 8:00 AM Medical Record Number: [id number] Patient Account Number: [id number] Date of Birth/Sex: [birth year] (68 y.o. M) Treating RN: Harold Barban Primary Care Provider: [name] Other Clinician: Referring Provider: [name] Treating Provider/Extender: [name], HOYT Weeks in Treatment: 71 Information Obtained From Patient Constitutional Symptoms (General Health) Complaints and Symptoms: Negative for: Fatigue; Fever; Chills; Marked Weight Change Respiratory Complaints and Symptoms: Negative for: Chronic or frequent coughs; Shortness of Breath Medical History: Negative for: Aspiration; Asthma; Chronic Obstructive Pulmonary Disease (COPD); Pneumothorax; Sleep Apnea; Tuberculosis Cardiovascular Complaints and Symptoms: Negative for: Chest pain; LE edema Medical History: Positive for: Hypertension - takes medication Negative for: Angina; Arrhythmia; Congestive Heart Failure; Coronary Artery Disease; Deep Vein Thrombosis; Hypotension; Myocardial Infarction; Peripheral Arterial Disease; Peripheral Venous Disease; Phlebitis; Vasculitis Psychiatric Complaints and Symptoms: Negative for: Anxiety; Claustrophobia Medical History: Negative for: Anorexia/bulimia; Confinement Anxiety Eyes Medical History: Positive for: Cataracts - both removed Negative for: Glaucoma; Optic Neuritis Ear/Nose/Mouth/Throat Medical History: Negative for: Chronic sinus problems/congestion; Middle ear problems Hematologic/Lymphatic Medical History: Negative for: Anemia; Hemophilia; Human Immunodeficiency Virus; Lymphedema DELONTA, YOHANNES. ([id number]) Gastrointestinal Medical History: Negative for: Cirrhosis ; Colitis; Crohnos; Hepatitis A; Hepatitis B; Hepatitis C Endocrine Medical History: Negative for: Type I Diabetes; Type II Diabetes Genitourinary Medical History: Negative for: End Stage Renal Disease Immunological Medical History: Negative for: Lupus Erythematosus; Raynaudos; Scleroderma Integumentary (Skin) Medical History: Negative for: History of Burn; History of pressure wounds Musculoskeletal Medical History: Negative for: Gout; Rheumatoid  Arthritis; Osteoarthritis; Osteomyelitis Neurologic Medical History: Positive for: Paraplegia - waist down Negative for: Dementia; Neuropathy; Quadriplegia; Seizure Disorder Oncologic Medical History: Negative for: Received Chemotherapy; Received Radiation Past Medical History Notes: Prostate cancer- currently treated with horomone therapy HBO Extended History Items Eyes: Cataracts Immunizations Pneumococcal Vaccine: Received Pneumococcal Vaccination: No Implantable Devices No devices added Family and Social History Cancer: No; Diabetes: No; Heart Disease: No; Hypertension: Yes - Father; Kidney Disease: No; Lung Disease: No; Seizures: No; Stroke: Yes - Mother; Thyroid Problems: No; Tuberculosis: No; Never smoker; Marital Status - Married; Alcohol Use: Never; Drug Use: No History; Caffeine Use: Daily BOLIVAR, KORANDA ([id number]) Physician Affirmation I have reviewed and agree with the above information. Electronic Signature(s) Signed: 01/05/2019 6:29:07 AM By: Worthy Keeler [name] Signed: 01/05/2019 4:39:36 PM By: Harold Barban Entered By: Worthy Keeler on 01/03/2019 23:24:27 BHARAT, ANTILLON ([id number]) -------------------------------------------------------------------------------- SuperBill Details Patient Name: [name] Date of Service: 01/03/2019 Medical Record Number: [id number] Patient Account Number: [id number] Date of Birth/Sex: Dec 13, 1950 (68 y.o. 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

## 2019-01-10 ENCOUNTER — Encounter: Payer: Medicare Other | Admitting: Physician Assistant

## 2019-01-10 ENCOUNTER — Other Ambulatory Visit: Payer: Self-pay

## 2019-01-10 DIAGNOSIS — L89314 Pressure ulcer of right buttock, stage 4: Secondary | ICD-10-CM | POA: Diagnosis not present

## 2019-01-11 NOTE — Progress Notes (Signed)
[name], [name] ([id number]) Visit Report for 01/10/2019 Chief Complaint Document Details Patient Name: [name], [name] Date of Service: 01/10/2019 8:00 AM Medical Record Number: [id number] Patient Account Number: [id number] Date of Birth/Sex: Sep 02, 1950 (68 y.o. M) Treating RN: Harold Barban Primary Care Provider: [name] Other Clinician: Referring Provider: [name] Treating Provider/Extender: [name], HOYT Weeks in Treatment: 71 Information Obtained from: Patient Chief Complaint Right gluteal fold ulcer Electronic Signature(s) Signed: 01/10/2019 4:05:21 PM By: Worthy Keeler [name] Entered By: Worthy Keeler on 01/10/2019 08:17:14 [name], [name] ([id number]) -------------------------------------------------------------------------------- Debridement Details Patient Name: [name] Date of Service: 01/10/2019 8:00 AM Medical Record Number: [id number] Patient Account Number: [id number] Date of Birth/Sex: May 08, 1951 (68 y.o. M) Treating RN: Harold Barban Primary Care Provider: [name] Other Clinician: Referring Provider: [name] Treating Provider/Extender: [name], HOYT Weeks in Treatment: 67 Debridement Performed for Wound #1 Right Gluteal fold Assessment: Performed By: Physician [name], HOYT E., [name] Debridement Type: Debridement Level of Consciousness (Pre- Awake and Alert procedure): Pre-procedure Verification/Time Yes - 08:36 Out Taken: Start Time: 08:36 Pain Control: Lidocaine Total Area Debrided (L x W): 3.2 (cm) x 2.3 (cm) = 7.36 (cm) Tissue and other material Viable, Non-Viable, Slough, Subcutaneous, Slough debrided: Level: Skin/Subcutaneous Tissue Debridement Description: Excisional Instrument: Curette, Forceps, Scissors Bleeding: Large Hemostasis Achieved: Pressure End Time: 08:50 Procedural Pain: 0 Post Procedural Pain: 0 Response to Treatment: Procedure was tolerated well Level of  Consciousness Awake and Alert (Post-procedure): Post Debridement Measurements of Total Wound Length: (cm) 3.2 Stage: Category/Stage IV Width: (cm) 2.3 Depth: (cm) 3.1 Volume: (cm) 17.92 Character of Wound/Ulcer Post Improved Debridement: Post Procedure Diagnosis Same as Pre-procedure Electronic Signature(s) Signed: 01/10/2019 3:57:45 PM By: Harold Barban Signed: 01/10/2019 4:05:21 PM By: Worthy Keeler [name] Entered By: Harold Barban on 01/10/2019 08:50:54 [name], [name] ([id number]) -------------------------------------------------------------------------------- HPI Details Patient Name: [name] Date of Service: 01/10/2019 8:00 AM Medical Record Number: [id number] Patient Account Number: [id number] Date of Birth/Sex: 16-Jun-1951 (68 y.o. 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 He states that there is no history of diabetes that he is aware of. He does have issues with his prostate and is currently receiving treatment for this by way of oral medication. With that being said I do not have a lot of details in that regard. Nonetheless the patient presents today as a result of having been referred to [place] by another provider initially home health was set to come out and take care of his wound although due to the fact that he apparently drives he's not able to receive home health. His wife is therefore trying to help take care of this wound within although they have been struggling with what exactly to do at this point.  She states that she can do some things but she is definitely not a nurse and does have some issues with looking at blood. The good news is the wound does not appear to be too deep and is fairly superficial at this point. There is no slough noted there is some nonviable skin noted around the surface of  the wound and the perimeter at this point. The central portion of the wound appears to be very good with a dermal layer noted this does not appear to be again deep enough to extend it to subcutaneous tissue at this point. 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 In the pump he has Dilaudid and baclofen. 12/07/17 on evaluation today patient presents for follow-up concerning his ongoing lower back thigh ulcer on the right. He states that he did not get the supplies ordered and therefore has not really been able to perform the dressing changes as directed exactly. His wife was able to get some Boarder Foam Dressing's from the drugstore and subsequently has been using hydrogel which did help to a degree in the wound does appear to be able smaller. There is actually more drainage this week noted than previous. 12/21/17 on evaluation today patient appears to be doing rather well in regard to his right gluteal ulcer. He has been tolerating the dressing changes without complication. There does not appear to be any evidence of infection at this point in time. Overall the wound does seem to be making some progress as far as the edges are concerned there's not as much in the way of overlapping of the external wound edges and he has a good epithelium to wound bed border for the most part. This however is not true right at the 12 o'clock location over the span of a little over a centimeters which actually will require debridement today to clean this away and hopefully allow it to continue to heal more  appropriately. 12/28/17 on evaluation today patient appears to be doing rather well in regard to his ulcer in the left gluteal region. He's been tolerating the dressing changes without complication. Apparently he has had some difficulty getting his dressing material. Apparently there's been some confusion with ordering we're gonna check into this. Nonetheless overall he's been showing signs of improvement which is good news. Debridement is not required today. 01/04/18 on evaluation today patient presents for follow-up concerning his right gluteal ulcer. He has been tolerating the dressing changes fairly well. On inspection today it appears he may actually have some maceration them concerned about the fact that he may be developing too much moisture in and around the wound bed which can cause delay in healing. With that being said he unfortunately really has not showed significant signs of improvement since last week's evaluation in fact this may even be just the little bit/slightly larger. Nonetheless he's been having a lot of discomfort I'm not sure this is even related to the wound as he has no pain when I'm to breeding or otherwise cleaning the wound during evaluation today. Nonetheless this is something that we did recommend he talked to his pain specialist concerning. 01/11/18 on evaluation today patient appears to be doing better in regard to his ulceration. He has been tolerating the dressing changes without complication. With that being said overall there's no evidence of infection which is good news. The only thing is he did receive the hatch affair blue classic versus the ready nonetheless I feel like this is perfectly fine and appears to have done well for him over the past week. 01/25/18 on evaluation today patient's wound actually appears to be a little bit larger than during the last evaluation. The good DAVINE, COBA. ([id number]) news is the majority of the  wound edges actually  appear to be fairly firmly attached to the wound bed unfortunately again we're not really making progress in regard to the size. Roughly the wound is about the same size as when I first saw him although again the wound margin/edges appear to be much better. 02/01/18 on evaluation today patient actually appears to be doing very well in regard to his wound. Applying the Prisma dry does seem to be better although he does still have issues with slow progression of the wound. There was a slight improvement compared to last week's measurements today. Nonetheless I have been considering other options as far as the possibility of Theraskin or even a snap vac. In general I'm not sure that the Theraskin due to location of the wound would be a very good idea. Nonetheless I do think that a snap vac could be a possibility for the patient and in fact I think this could even be an excellent way to manage the wound possibly seeing some improvement in a very rapid fashion here. Nonetheless this is something that we would need to get approved and I did have a lengthy conversation with the patient about this today. 02/08/18 on evaluation today patient appears to be doing a little better in regard to his ulcer. He has been tolerating the dressing changes without complication. Fortunately despite the fact that the wound is a little bit smaller it's not significantly so unfortunately. We have discussed the possibility of a snap vac we did check with insurance this is actually covered at this point. Fortunately there does not appear to be any sign of infection. Overall I'm fairly pleased with how things seem to be appearing at this point. 02/15/18 on evaluation today patient appears to be doing rather well in regard to his right gluteal ulcer. Unfortunately the snap vac did not stay in place with his sheer and friction this came loose and did not seem to maintain seal very well. He worked for about two days and it did seem  to do very well during that time according to his wife but in general this does not seem to be something that's gonna be beneficial for him long-term. I do believe we need to go back to standard dressings to see if we can find something that will be of benefit. 03/02/18- He is here in follow up evaluation; there is minimal change in the wound. He will continue with the same treatment plan, would consider changing to iodosrob/iodoflex if ulcer continues to to plateau. He will follow up next week 03/08/18 on evaluation today patient's wound actually appears to be about the same size as when I previously saw him several weeks back. Unfortunately he does have some slightly dark discoloration in the central portion of the wound which has me concerned about pressure injury. I do believe he may be sitting for too long a period of time in fact he tells me that "I probably sit for much too long". He does have some Slough noted on the surface of the wound and again as far as the size of the wound is concerned I'm really not seeing anything that seems to have improved significantly. 03/15/18 on evaluation today patient appears to be doing fairly well in regard to his ulcer. The wound measured pretty much about the same today compared to last week's evaluation when looking at his graph. With that being said the area of bruising/deep tissue injury that was noted last week I do not see at  this point. He did get a new cushion fortunately this does seem to be have been of benefit in my pinion. It does appear that he's been off of this more which is good news as well I think that is definitely showing in the overall wound measurements. With that being said I do believe that he needs to continue to offload I don't think that the fact this is doing better should be or is going to allow him to not have to offload and explain this to him as well. Overall he seems to be in agreement the plan I think he understands. The  overall appearance of the wound bed is improved compared to last week I think the Iodoflex has been beneficial in that regard. 03/29/18 on evaluation today patient actually appears to be doing rather well in regard to his wound from the overall appearance standpoint he does have some granulation although there's some Slough on the surface of the wound noted as well. With that being said he unfortunately has not improved in regard to the overall measurement of the wound in volume or in size. I did have a discussion with him very specifically about offloading today. He actually does work although he mainly is just sitting throughout the day. He tells me he offloads by "lifting himself up for 30 seconds off of his chair occasionally" purchase from [employer] which does seem to have helped. And he has a new cushion that he with that being said he's also able to stand some for a very short period of time but not significant enough I think to provide appropriate offloading. I think the biggest issue at this point with the wound and the fact is not healing as quickly as we would like is due to the fact that he is really not able to appropriately offload while at work. He states the beginning after his injury he actually had a bed at his job that he could lay on in order to offload and that does seem to have been of help back at that time. Nonetheless he had not done this in quite some time unfortunately. I think that could be helpful for him this is something I would like for him to look into. 04/05/18 on evaluation today patient actually presents for follow-up concerning his right gluteal ulcer. Again he really is not significantly improved even compared to last week. He has been tolerating the dressing changes without complication. With that being said fortunately there appears to be no evidence of infection at this time. He has been more proactive in trying to offload. [name], [name]  ([id number]) 04/12/18 on evaluation today patient actually appears to be doing a little better in regard to his wound and the right gluteal fold region. He's been tolerating the dressing changes since removing the oasis without complication. However he was having a lot of burning initially with the oasis in place. He's unsure of exactly why this was given so much discomfort but he assumes that it was the oasis itself causing the problem. Nonetheless this had to be removed after about three days in place although even those three days seem to have made a fairly good improvement in regard to the overall appearance of the wound bed. In fact is the first time that he's made any improvement from the standpoint of measurements in about six weeks. He continues to have no discomfort over the area of the wound itself which leads me to wonder why he was  having the burning with the oasis when he does not even feel the actual debridement's themselves. I am somewhat perplexed by this. 04/19/18 on evaluation today patient's wound actually appears to be showing signs of epithelialization around the edge of the wound and in general actually appears to be doing better which is good news. He did have the same burning after about three days with applying the Endoform last week in the same fashion that I would generally apply a skin substitute. This seems to indicate that it's not the oasis to cause the problem but potentially the moisture buildup that just causes things to burn or there may be some other reaction with the skin prep or Steri-Strips. Nonetheless I'm not sure that is gonna be able to tolerate any skin substitute for a long period of time. The good news is the wound actually appears to be doing better today compared to last week and does seem to finally be making some progress. 04/26/18 on evaluation today patient actually appears to be doing rather well in regard to his ulcer in the right gluteal fold.  He has been tolerating the dressing changes without complication which is good news. The Endoform does seem to be helping although he was a little bit more macerated this week. This seems to be an ongoing issue with fluid control at this point. Nonetheless I think we may be able to add something like Drawtex to help control the drainage. 05/03/18 on evaluation today patient appears to actually be doing better in regard to the overall appearance of his wound. He has been tolerating the dressing changes without complication. Fortunately there appears to be no evidence of infection at this time. I really feel like his wound has shown signs as of today of turning around last week I thought so as well and definitely he could be seen in this week's overall appearance and measurements. In general I'm very pleased with the fact that he finally seems to be making a steady but sure progress. The patient likewise is very pleased. 05/17/18 on evaluation today patient appears to be doing more poorly unfortunately in regard to his ulcer. He has been tolerating the dressing changes without complication. With that being said he tells me that in the past couple of days he and his wife have noticed that we did not seem to be doing quite as well is getting dark near the center. Subsequently upon evaluation today the wound actually does appear to be doing worse compared to previous. He has been tolerating the dressing changes otherwise and he states that he is not been sitting up anymore than he was in the past from what he tells me. Still he has continued to work he states "I'm tired of dealing with this and if I have to just go home and lay in the bed all the time that's what I'll do". Nonetheless I am concerned about the fact that this wound does appear to be deeper than what it was previous. 05/24/18 upon evaluation today patient actually presents after having been in the hospital due to what was presumed to  be sepsis secondary to the wound infection. He had an elevated white blood cell count between 14 and 15. With that being said he does seem to be doing somewhat better now. His wound still is giving him some trouble nonetheless and he is obviously concerned about the fact likely talked about that this does seem to go more deeply than previously noted. I did review  his wound culture which showed evidence of Staphylococcus aureus him and group B strep. Nonetheless he is on antibiotics, Levaquin, for this. Subsequently I did review his intake summary from the hospital as well. I also did look at the CT of the lumbar spine with contrast that was performed which showed no bone destruction to suggest lumbar disguises/osteomyelitis or sacral osteomyelitis. There was no paraspinal abscess. Nonetheless it appears this may have been more of just a soft tissue infection at this point which is good news. He still is nonetheless concerned about the wound which again I think is completely reasonable considering everything he's been through recently. 05/31/18 on evaluation today on evaluation today patient actually appears to be showing signs of his wound be a little bit deeper than what I would like to see. Fortunately he does not show any signs of significant infection although his temperature was 99 today he states he's been checking this at home and has not been elevated. Nonetheless with the undermining that I'm seeing at this point I am becoming more concerned about the wound I do think that offloading is a key factor here that is preventing the speedy recovery at this point. There does not appear to be any evidence of again over infection noted. He's been using Santyl currently. 06/07/18 the patient presents today for follow-up evaluation regarding the left ulcer in the gluteal region. He has been tolerating the Wound VAC fairly well. He is obviously very frustrated with this he states that to mean is really  getting in his way. There does not appear to be any evidence of infection at this time he does have a little bit of odor I do not necessarily associate this with infection just something that we sometimes notice with Wound VAC therapy. With that being said I can definitely catch a tone of discontentment overall in the patient's demeanor today. This when he was previously in the hospital WEYLYN, RICCIUTI. ([id number]) an CT scan was done of the lumbar region which did not reveal any signs of osteomyelitis. With that being said the pelvis in particular was not evaluated distinctly which means he could still have some osteonecrosis I. Nonetheless the Wound VAC was started on Thursday I do want to get this little bit more time before jumping to a CT scan of the pelvis although that is something that I might would recommend if were not see an improvement by that time. 06/14/18 on evaluation today patient actually appears to be doing about the same in regard to his right gluteal ulcer. Again he did have a CT scan of the lumbar spine unfortunately this did not include the pelvis. Nonetheless with the depth of the wound that I'm seeing today even despite the fact that I'm not seeing any evidence of overt cellulitis I believe there's a good chance that we may be dealing with osteomyelitis somewhere in the right Ischial region. No fevers, chills, nausea, or vomiting noted at this time. 06/21/18 on evaluation today patient actually appears to be doing about the same with regard to his wound. The tunnel at 6 o'clock really does not appear to be any deeper although it is a little bit wider. I think at this point you may want to start packing this with white phone. Unfortunately I have not got approval for the CT scan of the pelvis as of yet due to the fact that Medicare apparently has been denied it due to the diagnosis codes not being appropriate according to [name]  for the test requested. With that being  said the patient cannot have an MRI and therefore this is the only option that we have as far as testing is concerned. The patient has had infection and was on antibiotics and been added [name] for cellulitis of the bottom to see if this will be appropriate for getting the test approved. Nonetheless I'm concerned about the infection have been spread deeper into the Ischial region. 06/28/18 on evaluation today patient actually appears to be doing rather well all things considered in regard to the right gluteal ulcer. He has been tolerating the dressing changes without complication. With that being said the Wound VAC he states does have to be replaced almost every day or at least reinforced unfortunately. Patient actually has his CT scan later this morning we should have the results by tomorrow. 07/05/18 on evaluation today patient presents for follow-up concerning his right Ischial ulcer. He did see the surgeon Dr. Lysle Pearl last week. They were actually very happy with him and felt like he spent a tremendous amount of time with them as far as discussing his situation was concerned. In the end Dr. Lysle Pearl did contact me as well and determine that he would not recommend any surgical intervention at this point as he felt like it would not be in the patient's best interest based on what he was seeing. He recommended a referral to infectious disease. Subsequently this is something that Dr. Ines Bloomer office is working on setting up for the patient. As far as evaluation today is concerned the patient's wound actually appears to be worse at this point. I am concerned about how things are progressing and specifically about infection. I do not feel like it's the deeper but the area of depth is definitely widening which does have me concerned. No fevers, chills, nausea, or vomiting noted at this time. I think that we do need initiate antibiotic therapy the patient has an allow allergy to amoxicillin/penicillin he states  that he gets a rash since childhood. Nonetheless she's never had the issues with Catholics or cephalosporins in general but he is aware of. 07/27/18 on evaluation today patient presents following admission to the hospital on 07/09/18. He was subsequently discharged on 07/20/18. On 07/15/18 the patient underwent irrigation and debridement was soft tissue biopsy and bone biopsy as well as placement of a Wound VAC in the OR by Dr. Celine Ahr. During the hospital course the patient was placed on a Wound VAC and recommended follow up with surgery in three weeks actually with Dr. Delaine Lame who is infectious disease. The patient was on vancomycin during the hospital course. 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 He also had a bone culture which revealed evidence of methicillin-resistant staph aureus. He is updated CT scan 07/09/18 reveals that he had progression of the which was performed on wound to breakdown down to the trochanter where he actually had irregularities there as well suggestive of osteomyelitis. This was a change just since 9 December when we last performed a CT scan. Obviously this one had gone downhill quite significantly and rapidly. At this point upon evaluation I feel like in general the patient's wound seems to be doing fairly well all things considered upon my evaluation today. Obviously this is larger and deeper than what I previously evaluated but at the same time he seems to be making some progress as far as the appearance of the granulation tissue is concerned. I'm happy in that regard. No  fevers, chills, nausea, or vomiting noted at this time. He is on IV vancomycin and Rocephin at the facility. He is currently in [facility]. 08/03/18 upon evaluation today patient's wound appears to be doing better in regard to the overall appearance at this point in time. Fortunately he's been tolerating the Wound VAC without complication and states that  the facility has been taking excellent care of the wound site. Overall I see some Slough noted on the surface which I am going to attempt sharp debridement today of but nonetheless other than this I feel like he's making progress. 08/09/18 on evaluation today patient's wound appears to be doing much better compared to even last week's evaluation. Do believe that the Wound VAC is been of great benefit for him. He has been tolerating the dressing changes that is the Wound [name], [name]. ([id number]) VAC without any complication and he has excellent granulation noted currently. There is no need for sharp debridement at this point. 08/16/18 on evaluation today patient actually appears to be doing very well in regard to the wound in the right gluteal fold region. This is showing signs of progress and again appears to be very healthy which is excellent news. Fortunately there is no sign of active infection by way of odor or drainage at this point. Overall I'm very pleased with how things stand. He seems to be tolerating the Wound VAC without complication. 08/23/18 on evaluation today patient actually appears to be doing better in regard to his wound. He has been tolerating the Wound VAC without complication and in fact it has been collecting a significant amount of drainage which I think is good news especially considering how the wound appears. Fortunately there is no signs of infection at this time definitely nothing appears to be worse which is good news. He has not been started on the Bactrim and Flagyl that was recommended by Dr. Delaine Lame yet. I did actually contact her office this morning in order to check and see were things are that regard their gonna be calling me back. 08/30/18 on evaluation today patient actually appears to show signs of excellent improvement today compared to last evaluation. The undermining is getting much better the wound seems to be feeling quite nicely and I'm very  pleased that the granulation in general. With that being said overall I feel like the patient has made excellent progress which is great news. No fevers, chills, nausea, or vomiting noted at this time. 09/06/18 on evaluation today patient actually appears to be doing rather well in regard to his right gluteal ulcer. This is showing signs of improvement in overall I'm very pleased with how things seem to be progressing. The patient likewise is please. Overall I see no evidence of infection he is about to complete his oral antibiotic regimen which is the end of the antibiotics for him in just about three days. 09/13/18 on evaluation today patient's right Ischial ulcer appears to be showing signs of continued improvement which is excellent news. He's been tolerating the dressing changes without complication. Fortunately there's no signs of infection and the wound that seems to be doing very well. 09/28/18 on evaluation today patient appears to be doing rather well in regard to his right Ischial ulcer. He's been tolerating the Wound VAC without complication he knows there's much less drainage than there used to be this obviously is not a bad thing in my pinion. There's no evidence of infection despite the fact is but nothing about it  now for several weeks. 10/04/18 on evaluation today patient appears to be doing better in regard to his right Ischial wound. He has been tolerating the Wound VAC without complication and I do believe that the silver nitrate last week was beneficial for him. Fortunately overall there's no evidence of active infection at this time which is great news. No fevers, chills, nausea, or vomiting noted at this time. 10/11/18 on evaluation today patient actually appears to be doing rather well in regard to his Ischial ulcer. He's been tolerating the Wound VAC still without complication I feel like this is doing a good job. No fevers, chills, nausea, or vomiting noted at  this time. 11/01/18 on evaluation today patient presents after having not been seen in our clinic for several weeks secondary to the fact that he was on evaluation today patient presents after having not been seen in our clinic for several weeks secondary to the fact that he was in a skilled nursing facility which was on lockdown currently due to the covert 19 national emergency. Subsequently he was discharged from the facility on this past Friday and subsequently made an appointment to come in to see yesterday. Fortunately there's no signs of active infection at this time which is good news and overall he does seem to have made progress since I last saw. Overall I feel like things are progressing quite nicely. The patient is having no pain. 11/08/18 on evaluation today patient appears to be doing okay in regard to his right gluteal ulcer. He has been utilizing a Wound VAC home health this changing this at this point since he's home from the skilled nursing facility. Fortunately there's no signs of obvious active infection at this time. Unfortunately though there's no obvious active infection he is having some maceration and his wife states that when the sheets of the Wound VAC office on Sunday when it broke seal that he ended up having significant issues with some smell as well there concerned about the possibility of infection. Fortunately there's No fevers, chills, nausea, or vomiting noted at this time. 11/15/18 on evaluation today patient actually appears to be doing well in regard to his right gluteal ulcer. He has been tolerating the dressing changes without complication. Specifically the Wound VAC has been utilized up to this point. Fortunately there's no signs of infection and overall I feel like he has made progress even since last week when I last saw him. I'm actually fairly happy with the overall appearance although he does seem to have somewhat of a hyper granular Natter, [place] J.  ([id number]) overgrowth in the central portion of the wound which I think may require some sharp debridement to try flatness out possibly utilizing chemical cauterization following. 11/23/18 on evaluation today patient actually appears to be doing very well in regard to his sacral ulcer. He seems to be showing signs of improvement with good granulation. With that being said he still has the small area of hyper granulation right in the central portion of the wound which I'm gonna likely utilize silver nitrate on today. Subsequently he also keeps having a leak at the 6 o'clock location which is unfortunate we may be able to help out with some suggestions to try to prevent this going forward. Fortunately there's no signs of active infection at this time. 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 He's been tolerating the dressing changes without  complication. Fortunately there's no signs of active infection at this time. I've been rather pleased with how things have progressed there still some evidence of pressure getting to the area with some redness right around the immediate wound opening. Nonetheless other than this I'm not seeing any significant complications or issues the wound is somewhat hyper granular. Upon discussing with the patient and his wife today I'm not sure that the wound is being packed to the base with the foam at this point. And if it's not been packed fully that may be part of the reason why is not seen as much improvement as far as the granulation from the base out. Again we do not want pack too tightly but we need some of the firm to get to the base of the wound. I discussed this with patient and his wife today. 12/06/18 on evaluation today patient appears to be doing well in regard to his right gluteal pressure ulcer. He's been tolerating the dressing changes without complication.  Fortunately there's no signs of active infection. He still has some hyper granular tissue and I do think it would be appropriate to continue with the chemical cauterization as of today. 12/16/18 on evaluation today patient actually appears to be doing okay in regard to his right gluteal ulcer. He is been tolerating the dressing changes without complication including the Wound VAC. Overall I feel like nothing seems to be worsening I do feel like that the hyper granulation buds in the central portion of the wound have improved to some degree with the silver nitrate. We will have to see how things continue to progress. 12/20/18 on evaluation today patient actually appears to be doing much worse in my pinion even compared to last week's evaluation. Unfortunately as opposed to showing any signs of improvement the areas of hyper granular tissue in the central portion of the wound seem to be getting worse. Subsequently the wound bed itself also seems to be getting deeper even compared to last week which is both unusual as well as concerning since prior he had been shown signs of improvement. Nonetheless I think that the issue could be that he's actually having some difficulty in issues with a deeper infection. There's no external signs of infection but nonetheless I am more worried about the internal, osteomyelitis, that could be restarting. He has not been on antibiotics for some time at this point. I think that it may be a good idea to go ahead and started back on an antibiotic therapy while we wait to see what the testing shows. 12/27/18 on evaluation today patient presents for follow-up concerning his left gluteal fold wound. Fortunately he appears to be doing well today. I did review the CT scan which was negative for any signs of osteomyelitis or acute abnormality this is excellent news. Overall I feel like the surface of the wound bed appears to be doing significantly better today compared to previously  noted findings. There does not appear any signs of infection nor does he have any pain at this time. 01/03/19 on evaluation today patient actually appears to be doing quite well in regard to his ulcer. Post debridement last week he really did not have too much bleeding which is good news. Fortunately today this seems to be doing some better but we still has some of the hyper granular tissue noted in the base of the wound which is gonna require sharp debridement today as well. Overall I'm pleased with how things seem to be  progressing since we switched away from the Wound VAC I think he is making some progress. 01/10/19 on evaluation today patient appears to be doing better in regard to his right gluteal fold ulcer. He has been tolerating the dressing changes without complication. The debridement to seem to be helping with current away some of the poor hyper granular tissue bugs throughout the region of his gluteal fold wound. He's been tolerating the dressing changes otherwise without complication which is great news. No fevers, chills, nausea, or vomiting noted at this time. Electronic Signature(s) Signed: 01/10/2019 4:05:21 PM By: Worthy Keeler [name] Entered By: Worthy Keeler on 01/10/2019 09:55:15 [name], [name] ([id number]) -------------------------------------------------------------------------------- Physical Exam Details Patient Name: [name] Date of Service: 01/10/2019 8:00 AM Medical Record Number: [id number] Patient Account Number: [id number] Date of Birth/Sex: 11-11-1950 (68 y.o. M) Treating RN: Harold Barban Primary Care Provider: [name] Other Clinician: Referring Provider: [name] Treating Provider/Extender: [name], HOYT Weeks in Treatment: 58 Constitutional Well-nourished and well-hydrated in no acute distress. Respiratory normal breathing without difficulty. Psychiatric this patient is able to make decisions and demonstrates good  insight into disease process. Alert and Oriented x 3. pleasant and cooperative. Notes Patient's wound bed currently showed signs of some good granulation although he did have some poor granular areas with some of the hyper granular tissue bugs noted as well which did require sharp debridement today. Again this is not good healing tissue as I explained to the patient in the past he was in agreement with performing agreement. I did actually perform a fairly extensive debridement try to clear the majority if not all this away today post debridement I did use silver nitrate for chemical cauterization which he tolerated without any pain or complication throughout the procedure. Electronic Signature(s) Signed: 01/10/2019 4:05:21 PM By: Worthy Keeler [name] Entered By: Worthy Keeler on 01/10/2019 09:55:50 [name], [name] ([id number]) -------------------------------------------------------------------------------- Physician Orders Details Patient Name: [name] Date of Service: 01/10/2019 8:00 AM Medical Record Number: [id number] Patient Account Number: [id number] Date of Birth/Sex: 1950/12/18 (68 y.o. 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 Skin Barriers/Peri-Wound Care Wound #1 Right  Gluteal fold o Other: - Apply zinc oxide to peri-wound area. Primary Wound Dressing Wound #1 Right Gluteal fold o Silver Alginate o Pack wound with: - Dakin's 0.25% soaked gauze packed into the wound bed. Place alginate on top of wound. Secondary Dressing Wound #1 Right Gluteal fold o ABD pad - Tegaderm to secure Dressing Change Frequency Wound #1 Right Gluteal fold o Change dressing every day. Follow-up Appointments Wound #1 Right Gluteal fold o Return Appointment in 1 week. Off-Loading Wound #1 Right Gluteal fold o Turn and reposition every 2 hours JAIDEN, DINKINS ([id number]) Home Health Wound #1 Right Gluteal fold o FACE TO FACE ENCOUNTER: MEDICARE and MEDICAID PATIENTS: I certify that this patient is under my care and that I had a face-to-face  encounter that meets the physician face-to-face encounter requirements with this patient on this date. The encounter with the patient was in whole or in part for the following MEDICAL CONDITION: (primary reason for [place]) MEDICAL NECESSITY: I certify, that based on my findings, NURSING services are a medically necessary home health service. HOME BOUND STATUS: I certify that my clinical findings support that this patient is homebound (i.e., Due to illness or injury, pt requires aid of supportive devices such as crutches, cane, wheelchairs, walkers, the use of special transportation or the assistance of another person to leave their place of residence. There is a normal inability to leave the home and doing so requires considerable and taxing effort. Other absences are for medical reasons / religious services and are infrequent or of short duration when for other reasons). Negative Pressure Wound Therapy Wound #1 Right Gluteal fold o Discontinue NPWT. Electronic Signature(s) Signed: 01/10/2019 3:57:45 PM By: Harold Barban Signed: 01/10/2019 4:05:21 PM By: Worthy Keeler [name] Entered By: Harold Barban on  01/10/2019 09:31:07 [name], [name] ([id number]) -------------------------------------------------------------------------------- Problem List Details Patient Name: [name] Date of Service: 01/10/2019 8:00 AM Medical Record Number: [id number] Patient Account Number: [id number] Date of Birth/Sex: 1951-03-03 (68 y.o. M) Treating RN: Harold Barban Primary Care Provider: [name] Other Clinician: Referring Provider: [name] Treating Provider/Extender: [name], HOYT Weeks in Treatment: 64 Active Problems ICD-10 Evaluated Encounter [name] Description Active Date Today Diagnosis L89.314 Pressure ulcer of right buttock, stage 4 11/30/2017 No Yes L03.317 Cellulitis of buttock 06/21/2018 No Yes G82.20 Paraplegia, unspecified 11/30/2017 No Yes S34.109S Unspecified injury to unspecified level of lumbar spinal cord, 11/30/2017 No Yes sequela I10 Essential (primary) hypertension 11/30/2017 No Yes Inactive Problems Resolved Problems Electronic Signature(s) Signed: 01/10/2019 4:05:21 PM By: Worthy Keeler [name] Entered By: Worthy Keeler on 01/10/2019 08:16:58 [name], [name] ([id number]) -------------------------------------------------------------------------------- Progress Note Details Patient Name: [name] Date of Service: 01/10/2019 8:00 AM Medical Record Number: [id number] Patient Account Number: [id number] Date of Birth/Sex: Dec 31, 1950 (68 y.o. 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 He states that there is no history of diabetes that he is aware of. He does have issues with his prostate and is currently receiving treatment for this by way of oral medication. With that being said I do not have a lot of details in that regard. Nonetheless the patient presents today as a result of having been referred to [place] by another provider initially home health was set to come out and take care of his wound although due to the fact that he apparently drives he's not able to receive home health. His wife is therefore trying to help take care of this wound within although they have been struggling with what exactly to do at this point. She states that she can do some things but she is definitely not a nurse and does have some issues with looking at blood. The good news is the wound does not appear to be too deep and is fairly superficial at this point.  There is no slough noted there is some nonviable skin noted around the surface of the wound and the perimeter at this point. The central portion of the wound appears to be very good with a dermal layer noted this does not appear to be again deep enough to extend it to subcutaneous tissue at this point. 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 In the pump he has Dilaudid and baclofen. 12/07/17 on evaluation today patient presents for follow-up concerning his ongoing lower back thigh ulcer on the right. He states that he did not get the supplies ordered and therefore has not really been able to perform the dressing changes as directed exactly. His wife was able to get some Boarder Foam Dressing's from the drugstore and subsequently has been using hydrogel which did help to a degree in the wound does appear to be able smaller. There is actually more drainage this week noted  than previous. 12/21/17 on evaluation today patient appears to be doing rather well in regard to his right gluteal ulcer. He has been tolerating the dressing changes without complication. There does not appear to be any evidence of infection at this point in time. Overall the wound does seem to be making some progress as far as the edges are concerned there's not as much in the way of overlapping of the external wound edges and he has a good epithelium to wound bed border for the most part. This however is not true right at the 12 o'clock location over the span of a little over a centimeters which actually will require debridement today to clean this away and hopefully allow it to continue to heal more appropriately. 12/28/17 on evaluation today patient appears to be doing rather well in regard to his ulcer in the left gluteal region. He's been tolerating the dressing changes without complication. Apparently he has had some difficulty getting his dressing material. Apparently there's been some confusion with ordering we're gonna check into this. Nonetheless overall he's been showing signs of improvement which is good news. Debridement is not required today. 01/04/18 on evaluation today patient presents for follow-up concerning his right gluteal ulcer. He has been tolerating the dressing changes fairly well. On inspection today it appears he may actually have some maceration them concerned about the fact that he may be developing too much moisture in and around the wound bed which can cause delay in healing. With that being said he unfortunately really has not showed significant signs of improvement since last week's evaluation in fact this may even be just the little bit/slightly larger. Nonetheless he's been having a lot of discomfort I'm not sure this is even related to the wound as he has no pain when I'm to breeding or otherwise cleaning the wound during evaluation today. Nonetheless this is  something that we did recommend he talked to his pain specialist concerning. 01/11/18 on evaluation today patient appears to be doing better in regard to his ulceration. He has been tolerating the dressing Bayliss, Khair J. ([id number]) changes without complication. With that being said overall there's no evidence of infection which is good news. The only thing is he did receive the hatch affair blue classic versus the ready nonetheless I feel like this is perfectly fine and appears to have done well for him over the past week. 01/25/18 on evaluation today patient's wound actually appears to be a  little bit larger than during the last evaluation. The good news is the majority of the wound edges actually appear to be fairly firmly attached to the wound bed unfortunately again we're not really making progress in regard to the size. Roughly the wound is about the same size as when I first saw him although again the wound margin/edges appear to be much better. 02/01/18 on evaluation today patient actually appears to be doing very well in regard to his wound. Applying the Prisma dry does seem to be better although he does still have issues with slow progression of the wound. There was a slight improvement compared to last week's measurements today. Nonetheless I have been considering other options as far as the possibility of Theraskin or even a snap vac. In general I'm not sure that the Theraskin due to location of the wound would be a very good idea. Nonetheless I do think that a snap vac could be a possibility for the patient and in fact I think this could even be an excellent way to manage the wound possibly seeing some improvement in a very rapid fashion here. Nonetheless this is something that we would need to get approved and I did have a lengthy conversation with the patient about this today. 02/08/18 on evaluation today patient appears to be doing a little better in regard to his ulcer. He has been  tolerating the dressing changes without complication. Fortunately despite the fact that the wound is a little bit smaller it's not significantly so unfortunately. We have discussed the possibility of a snap vac we did check with insurance this is actually covered at this point. Fortunately there does not appear to be any sign of infection. Overall I'm fairly pleased with how things seem to be appearing at this point. 02/15/18 on evaluation today patient appears to be doing rather well in regard to his right gluteal ulcer. Unfortunately the snap vac did not stay in place with his sheer and friction this came loose and did not seem to maintain seal very well. He worked for about two days and it did seem to do very well during that time according to his wife but in general this does not seem to be something that's gonna be beneficial for him long-term. I do believe we need to go back to standard dressings to see if we can find something that will be of benefit. 03/02/18- He is here in follow up evaluation; there is minimal change in the wound. He will continue with the same treatment plan, would consider changing to iodosrob/iodoflex if ulcer continues to to plateau. He will follow up next week 03/08/18 on evaluation today patient's wound actually appears to be about the same size as when I previously saw him several weeks back. Unfortunately he does have some slightly dark discoloration in the central portion of the wound which has me concerned about pressure injury. I do believe he may be sitting for too long a period of time in fact he tells me that "I probably sit for much too long". He does have some Slough noted on the surface of the wound and again as far as the size of the wound is concerned I'm really not seeing anything that seems to have improved significantly. 03/15/18 on evaluation today patient appears to be doing fairly well in regard to his ulcer. The wound measured pretty much about the  same today compared to last week's evaluation when looking at his graph. With that being said  the area of bruising/deep tissue injury that was noted last week I do not see at this point. He did get a new cushion fortunately this does seem to be have been of benefit in my pinion. It does appear that he's been off of this more which is good news as well I think that is definitely showing in the overall wound measurements. With that being said I do believe that he needs to continue to offload I don't think that the fact this is doing better should be or is going to allow him to not have to offload and explain this to him as well. Overall he seems to be in agreement the plan I think he understands. The overall appearance of the wound bed is improved compared to last week I think the Iodoflex has been beneficial in that regard. 03/29/18 on evaluation today patient actually appears to be doing rather well in regard to his wound from the overall appearance standpoint he does have some granulation although there's some Slough on the surface of the wound noted as well. With that being said he unfortunately has not improved in regard to the overall measurement of the wound in volume or in size. I did have a discussion with him very specifically about offloading today. He actually does work although he mainly is just sitting throughout the day. He tells me he offloads by "lifting himself up for 30 seconds off of his chair occasionally" purchase from [employer] which does seem to have helped. And he has a new cushion that he with that being said he's also able to stand some for a very short period of time but not significant enough I think to provide appropriate offloading. I think the biggest issue at this point with the wound and the fact is not healing as quickly as we would like is due to the fact that he is really not able to appropriately offload while at work. He states the beginning after his injury  he actually had a bed at his job that he could lay on in order to offload and that does seem to have been of help back at that time. Nonetheless he had not done this in quite some time unfortunately. I think that could be helpful for him this is something I would like for him to look into. BRIEN, LOWE ([id number]) 04/05/18 on evaluation today patient actually presents for follow-up concerning his right gluteal ulcer. Again he really is not significantly improved even compared to last week. He has been tolerating the dressing changes without complication. With that being said fortunately there appears to be no evidence of infection at this time. He has been more proactive in trying to offload. 04/12/18 on evaluation today patient actually appears to be doing a little better in regard to his wound and the right gluteal fold region. He's been tolerating the dressing changes since removing the oasis without complication. However he was having a lot of burning initially with the oasis in place. He's unsure of exactly why this was given so much discomfort but he assumes that it was the oasis itself causing the problem. Nonetheless this had to be removed after about three days in place although even those three days seem to have made a fairly good improvement in regard to the overall appearance of the wound bed. In fact is the first time that he's made any improvement from the standpoint of measurements in about six weeks. He continues to have no discomfort  over the area of the wound itself which leads me to wonder why he was having the burning with the oasis when he does not even feel the actual debridement's themselves. I am somewhat perplexed by this. 04/19/18 on evaluation today patient's wound actually appears to be showing signs of epithelialization around the edge of the wound and in general actually appears to be doing better which is good news. He did have the same burning after about  three days with applying the Endoform last week in the same fashion that I would generally apply a skin substitute. This seems to indicate that it's not the oasis to cause the problem but potentially the moisture buildup that just causes things to burn or there may be some other reaction with the skin prep or Steri-Strips. Nonetheless I'm not sure that is gonna be able to tolerate any skin substitute for a long period of time. The good news is the wound actually appears to be doing better today compared to last week and does seem to finally be making some progress. 04/26/18 on evaluation today patient actually appears to be doing rather well in regard to his ulcer in the right gluteal fold. He has been tolerating the dressing changes without complication which is good news. The Endoform does seem to be helping although he was a little bit more macerated this week. This seems to be an ongoing issue with fluid control at this point. Nonetheless I think we may be able to add something like Drawtex to help control the drainage. 05/03/18 on evaluation today patient appears to actually be doing better in regard to the overall appearance of his wound. He has been tolerating the dressing changes without complication. Fortunately there appears to be no evidence of infection at this time. I really feel like his wound has shown signs as of today of turning around last week I thought so as well and definitely he could be seen in this week's overall appearance and measurements. In general I'm very pleased with the fact that he finally seems to be making a steady but sure progress. The patient likewise is very pleased. 05/17/18 on evaluation today patient appears to be doing more poorly unfortunately in regard to his ulcer. He has been tolerating the dressing changes without complication. With that being said he tells me that in the past couple of days he and his wife have noticed that we did not seem to be doing  quite as well is getting dark near the center. Subsequently upon evaluation today the wound actually does appear to be doing worse compared to previous. He has been tolerating the dressing changes otherwise and he states that he is not been sitting up anymore than he was in the past from what he tells me. Still he has continued to work he states "I'm tired of dealing with this and if I have to just go home and lay in the bed all the time that's what I'll do". Nonetheless I am concerned about the fact that this wound does appear to be deeper than what it was previous. 05/24/18 upon evaluation today patient actually presents after having been in the hospital due to what was presumed to be sepsis secondary to the wound infection. He had an elevated white blood cell count between 14 and 15. With that being said he does seem to be doing somewhat better now. His wound still is giving him some trouble nonetheless and he is obviously concerned about the fact likely talked  about that this does seem to go more deeply than previously noted. I did review his wound culture which showed evidence of Staphylococcus aureus him and group B strep. Nonetheless he is on antibiotics, Levaquin, for this. Subsequently I did review his intake summary from the hospital as well. I also did look at the CT of the lumbar spine with contrast that was performed which showed no bone destruction to suggest lumbar disguises/osteomyelitis or sacral osteomyelitis. There was no paraspinal abscess. Nonetheless it appears this may have been more of just a soft tissue infection at this point which is good news. He still is nonetheless concerned about the wound which again I think is completely reasonable considering everything he's been through recently. 05/31/18 on evaluation today on evaluation today patient actually appears to be showing signs of his wound be a little bit deeper than what I would like to see. Fortunately he does not show  any signs of significant infection although his temperature was 99 today he states he's been checking this at home and has not been elevated. Nonetheless with the undermining that I'm seeing at this point I am becoming more concerned about the wound I do think that offloading is a key factor here that is preventing the speedy recovery at this point. There does not appear to be any evidence of again over infection noted. He's been using Santyl currently. TANAY, MISURACA ([id number]) 06/07/18 the patient presents today for follow-up evaluation regarding the left ulcer in the gluteal region. He has been tolerating the Wound VAC fairly well. He is obviously very frustrated with this he states that to mean is really getting in his way. There does not appear to be any evidence of infection at this time he does have a little bit of odor I do not necessarily associate this with infection just something that we sometimes notice with Wound VAC therapy. With that being said I can definitely catch a tone of discontentment overall in the patient's demeanor today. This when he was previously in the hospital an CT scan was done of the lumbar region which did not reveal any signs of osteomyelitis. With that being said the pelvis in particular was not evaluated distinctly which means he could still have some osteonecrosis I. Nonetheless the Wound VAC was started on Thursday I do want to get this little bit more time before jumping to a CT scan of the pelvis although that is something that I might would recommend if were not see an improvement by that time. 06/14/18 on evaluation today patient actually appears to be doing about the same in regard to his right gluteal ulcer. Again he did have a CT scan of the lumbar spine unfortunately this did not include the pelvis. Nonetheless with the depth of the wound that I'm seeing today even despite the fact that I'm not seeing any evidence of overt cellulitis I believe  there's a good chance that we may be dealing with osteomyelitis somewhere in the right Ischial region. No fevers, chills, nausea, or vomiting noted at this time. 06/21/18 on evaluation today patient actually appears to be doing about the same with regard to his wound. The tunnel at 6 o'clock really does not appear to be any deeper although it is a little bit wider. I think at this point you may want to start packing this with white phone. Unfortunately I have not got approval for the CT scan of the pelvis as of yet due to the fact that Medicare  apparently has been denied it due to the diagnosis codes not being appropriate according to Medicare for the test requested. With that being said the patient cannot have an MRI and therefore this is the only option that we have as far as testing is concerned. The patient has had infection and was on antibiotics and been added [name] for cellulitis of the bottom to see if this will be appropriate for getting the test approved. Nonetheless I'm concerned about the infection have been spread deeper into the Ischial region. 06/28/18 on evaluation today patient actually appears to be doing rather well all things considered in regard to the right gluteal ulcer. He has been tolerating the dressing changes without complication. With that being said the Wound VAC he states does have to be replaced almost every day or at least reinforced unfortunately. Patient actually has his CT scan later this morning we should have the results by tomorrow. 07/05/18 on evaluation today patient presents for follow-up concerning his right Ischial ulcer. He did see the surgeon Dr. Lysle Pearl last week. They were actually very happy with him and felt like he spent a tremendous amount of time with them as far as discussing his situation was concerned. In the end Dr. Lysle Pearl did contact me as well and determine that he would not recommend any surgical intervention at this point as he felt like it  would not be in the patient's best interest based on what he was seeing. He recommended a referral to infectious disease. Subsequently this is something that Dr. Ines Bloomer office is working on setting up for the patient. As far as evaluation today is concerned the patient's wound actually appears to be worse at this point. I am concerned about how things are progressing and specifically about infection. I do not feel like it's the deeper but the area of depth is definitely widening which does have me concerned. No fevers, chills, nausea, or vomiting noted at this time. I think that we do need initiate antibiotic therapy the patient has an allow allergy to amoxicillin/penicillin he states that he gets a rash since childhood. Nonetheless she's never had the issues with Catholics or cephalosporins in general but he is aware of. 07/27/18 on evaluation today patient presents following admission to the hospital on 07/09/18. He was subsequently discharged on 07/20/18. On 07/15/18 the patient underwent irrigation and debridement was soft tissue biopsy and bone biopsy as well as placement of a Wound VAC in the OR by Dr. Celine Ahr. During the hospital course the patient was placed on a Wound VAC and recommended follow up with surgery in three weeks actually with Dr. Delaine Lame who is infectious disease. The patient was on vancomycin during the hospital course. 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 He also had a bone culture which revealed evidence of methicillin-resistant staph aureus. He is updated CT scan 07/09/18 reveals that he had progression of the which was performed on wound to breakdown down to the trochanter where he actually had irregularities there as well suggestive of osteomyelitis. This was a change just since 9 December when we last performed a CT scan. Obviously this one had gone downhill quite significantly and rapidly. At this point upon evaluation I feel like  in general the patient's wound seems to be doing fairly well all things considered upon my evaluation today. Obviously this is larger and deeper than what I previously evaluated but at the same time he seems to be making some progress as  far as the appearance of the granulation tissue is concerned. I'm happy in that regard. No fevers, chills, nausea, or vomiting noted at this time. He is on IV vancomycin and Rocephin at the facility. He is currently in [facility]. 08/03/18 upon evaluation today patient's wound appears to be doing better in regard to the overall appearance at this point in time. Fortunately he's been tolerating the Wound VAC without complication and states that the facility has been taking BRAHIM, DOLMAN. ([id number]) excellent care of the wound site. Overall I see some Slough noted on the surface which I am going to attempt sharp debridement today of but nonetheless other than this I feel like he's making progress. 08/09/18 on evaluation today patient's wound appears to be doing much better compared to even last week's evaluation. Do believe that the Wound VAC is been of great benefit for him. He has been tolerating the dressing changes that is the Wound VAC without any complication and he has excellent granulation noted currently. There is no need for sharp debridement at this point. 08/16/18 on evaluation today patient actually appears to be doing very well in regard to the wound in the right gluteal fold region. This is showing signs of progress and again appears to be very healthy which is excellent news. Fortunately there is no sign of active infection by way of odor or drainage at this point. Overall I'm very pleased with how things stand. He seems to be tolerating the Wound VAC without complication. 08/23/18 on evaluation today patient actually appears to be doing better in regard to his wound. He has been tolerating the Wound VAC without complication and in fact it  has been collecting a significant amount of drainage which I think is good news especially considering how the wound appears. Fortunately there is no signs of infection at this time definitely nothing appears to be worse which is good news. He has not been started on the Bactrim and Flagyl that was recommended by Dr. Delaine Lame yet. I did actually contact her office this morning in order to check and see were things are that regard their gonna be calling me back. 08/30/18 on evaluation today patient actually appears to show signs of excellent improvement today compared to last evaluation. The undermining is getting much better the wound seems to be feeling quite nicely and I'm very pleased that the granulation in general. With that being said overall I feel like the patient has made excellent progress which is great news. No fevers, chills, nausea, or vomiting noted at this time. 09/06/18 on evaluation today patient actually appears to be doing rather well in regard to his right gluteal ulcer. This is showing signs of improvement in overall I'm very pleased with how things seem to be progressing. The patient likewise is please. Overall I see no evidence of infection he is about to complete his oral antibiotic regimen which is the end of the antibiotics for him in just about three days. 09/13/18 on evaluation today patient's right Ischial ulcer appears to be showing signs of continued improvement which is excellent news. He's been tolerating the dressing changes without complication. Fortunately there's no signs of infection and the wound that seems to be doing very well. 09/28/18 on evaluation today patient appears to be doing rather well in regard to his right Ischial ulcer. He's been tolerating the Wound VAC without complication he knows there's much less drainage than there used to be this obviously is not a bad thing in  my pinion. There's no evidence of infection despite the fact is but nothing  about it now for several weeks. 10/04/18 on evaluation today patient appears to be doing better in regard to his right Ischial wound. He has been tolerating the Wound VAC without complication and I do believe that the silver nitrate last week was beneficial for him. Fortunately overall there's no evidence of active infection at this time which is great news. No fevers, chills, nausea, or vomiting noted at this time. 10/11/18 on evaluation today patient actually appears to be doing rather well in regard to his Ischial ulcer. He's been tolerating the Wound VAC still without complication I feel like this is doing a good job. No fevers, chills, nausea, or vomiting noted at this time. 11/01/18 on evaluation today patient presents after having not been seen in our clinic for several weeks secondary to the fact that he was on evaluation today patient presents after having not been seen in our clinic for several weeks secondary to the fact that he was in a skilled nursing facility which was on lockdown currently due to the covert 19 national emergency. Subsequently he was discharged from the facility on this past Friday and subsequently made an appointment to come in to see yesterday. Fortunately there's no signs of active infection at this time which is good news and overall he does seem to have made progress since I last saw. Overall I feel like things are progressing quite nicely. The patient is having no pain. 11/08/18 on evaluation today patient appears to be doing okay in regard to his right gluteal ulcer. He has been utilizing a Wound VAC home health this changing this at this point since he's home from the skilled nursing facility. Fortunately there's no signs of obvious active infection at this time. Unfortunately though there's no obvious active infection he is having some maceration and his wife states that when the sheets of the Wound VAC office on Sunday when it broke seal that he ended up having  significant issues with some smell as well there concerned about the possibility of infection. Fortunately there's No fevers, chills, nausea, or vomiting noted at this time. [name], [name] ([id number]) 11/15/18 on evaluation today patient actually appears to be doing well in regard to his right gluteal ulcer. He has been tolerating the dressing changes without complication. Specifically the Wound VAC has been utilized up to this point. Fortunately there's no signs of infection and overall I feel like he has made progress even since last week when I last saw him. I'm actually fairly happy with the overall appearance although he does seem to have somewhat of a hyper granular overgrowth in the central portion of the wound which I think may require some sharp debridement to try flatness out possibly utilizing chemical cauterization following. 11/23/18 on evaluation today patient actually appears to be doing very well in regard to his sacral ulcer. He seems to be showing signs of improvement with good granulation. With that being said he still has the small area of hyper granulation right in the central portion of the wound which I'm gonna likely utilize silver nitrate on today. Subsequently he also keeps having a leak at the 6 o'clock location which is unfortunate we may be able to help out with some suggestions to try to prevent this going forward. Fortunately there's no signs of active infection at this time. 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 He's been tolerating the dressing changes without complication. Fortunately there's no signs of active infection at this time. I've been rather pleased with how things have progressed there still some evidence of pressure getting to the area with some redness right around the immediate wound opening. Nonetheless other than this I'm not seeing any  significant complications or issues the wound is somewhat hyper granular. Upon discussing with the patient and his wife today I'm not sure that the wound is being packed to the base with the foam at this point. And if it's not been packed fully that may be part of the reason why is not seen as much improvement as far as the granulation from the base out. Again we do not want pack too tightly but we need some of the firm to get to the base of the wound. I discussed this with patient and his wife today. 12/06/18 on evaluation today patient appears to be doing well in regard to his right gluteal pressure ulcer. He's been tolerating the dressing changes without complication. Fortunately there's no signs of active infection. He still has some hyper granular tissue and I do think it would be appropriate to continue with the chemical cauterization as of today. 12/16/18 on evaluation today patient actually appears to be doing okay in regard to his right gluteal ulcer. He is been tolerating the dressing changes without complication including the Wound VAC. Overall I feel like nothing seems to be worsening I do feel like that the hyper granulation buds in the central portion of the wound have improved to some degree with the silver nitrate. We will have to see how things continue to progress. 12/20/18 on evaluation today patient actually appears to be doing much worse in my pinion even compared to last week's evaluation. Unfortunately as opposed to showing any signs of improvement the areas of hyper granular tissue in the central portion of the wound seem to be getting worse. Subsequently the wound bed itself also seems to be getting deeper even compared to last week which is both unusual as well as concerning since prior he had been shown signs of improvement. Nonetheless I think that the issue could be that he's actually having some difficulty in issues with a deeper infection. There's no external signs of  infection but nonetheless I am more worried about the internal, osteomyelitis, that could be restarting. He has not been on antibiotics for some time at this point. I think that it may be a good idea to go ahead and started back on an antibiotic therapy while we wait to see what the testing shows. 12/27/18 on evaluation today patient presents for follow-up concerning his left gluteal fold wound. Fortunately he appears to be doing well today. I did review the CT scan which was negative for any signs of osteomyelitis or acute abnormality this is excellent news. Overall I feel like the surface of the wound bed appears to be doing significantly better today compared to previously noted findings. There does not appear any signs of infection nor does he have any pain at this time. 01/03/19 on evaluation today patient actually appears to be doing quite well in regard to his ulcer. Post debridement last week he really did not have too much bleeding which is good news. Fortunately today this seems to be doing some better but we still has some of the hyper granular tissue noted in the base of the wound which is [place]  require sharp debridement today as well. Overall I'm pleased with how things seem to be progressing since we switched away from the Wound VAC I think he is making some progress. 01/10/19 on evaluation today patient appears to be doing better in regard to his right gluteal fold ulcer. He has been tolerating the dressing changes without complication. The debridement to seem to be helping with current away some of the poor hyper granular tissue bugs throughout the region of his gluteal fold wound. He's been tolerating the dressing changes otherwise without complication which is great news. No fevers, chills, nausea, or vomiting noted at this time. Patient History Information obtained from Patient. CEZAR, MISIASZEK ([id number]) Family History Hypertension - Father, Stroke - Mother, No family  history of Cancer, Diabetes, Heart Disease, Kidney Disease, Lung Disease, Seizures, Thyroid Problems, Tuberculosis. Social History Never smoker, Marital Status - Married, Alcohol Use - Never, Drug Use - No History, Caffeine Use - Daily. Medical History Eyes Patient has history of Cataracts - both removed Denies history of Glaucoma, Optic Neuritis Ear/Nose/Mouth/Throat Denies history of Chronic sinus problems/congestion, Middle ear problems Hematologic/Lymphatic Denies history of Anemia, Hemophilia, Human Immunodeficiency Virus, Lymphedema Respiratory Denies history of Aspiration, Asthma, Chronic Obstructive Pulmonary Disease (COPD), Pneumothorax, Sleep Apnea, Tuberculosis Cardiovascular Patient has history of Hypertension - takes medication Denies history of Angina, Arrhythmia, Congestive Heart Failure, Coronary Artery Disease, Deep Vein Thrombosis, Hypotension, Myocardial Infarction, Peripheral Arterial Disease, Peripheral Venous Disease, Phlebitis, Vasculitis Gastrointestinal Denies history of Cirrhosis , Colitis, Crohn s, Hepatitis A, Hepatitis B, Hepatitis C Endocrine Denies history of Type I Diabetes, Type II Diabetes Genitourinary Denies history of End Stage Renal Disease Immunological Denies history of Lupus Erythematosus, Raynaud s, Scleroderma Integumentary (Skin) Denies history of History of Burn, History of pressure wounds Musculoskeletal Denies history of Gout, Rheumatoid Arthritis, Osteoarthritis, Osteomyelitis Neurologic Patient has history of Paraplegia - waist down Denies history of Dementia, Neuropathy, Quadriplegia, Seizure Disorder Oncologic Denies history of Received Chemotherapy, Received Radiation Psychiatric Denies history of Anorexia/bulimia, Confinement Anxiety Medical And Surgical History Notes Oncologic Prostate cancer- currently treated with horomone therapy Review of Systems (ROS) Constitutional Symptoms (General Health) Denies complaints or  symptoms of Fatigue, Fever, Chills, Marked Weight Change. Respiratory Denies complaints or symptoms of Chronic or frequent coughs, Shortness of Breath. Cardiovascular Denies complaints or symptoms of Chest pain, LE edema. Psychiatric Denies complaints or symptoms of Anxiety, Claustrophobia. [name], TRACZ ([id number]) Objective Constitutional Well-nourished and well-hydrated in no acute distress. Vitals Time Taken: 8:05 AM, Height: 73 in, Weight: 210 lbs, BMI: 27.7, Temperature: 99.0 F, Pulse: 81 bpm, Respiratory Rate: 16 breaths/min, Blood Pressure: 126/74 mmHg. Respiratory normal breathing without difficulty. Psychiatric this patient is able to make decisions and demonstrates good insight into disease process. Alert and Oriented x 3. pleasant and cooperative. General Notes: Patient's wound bed currently showed signs of some good granulation although he did have some poor granular areas with some of the hyper granular tissue bugs noted as well which did require sharp debridement today. Again this is not good healing tissue as I explained to the patient in the past he was in agreement with performing agreement. I did actually perform a fairly extensive debridement try to clear the majority if not all this away today post debridement I did use silver nitrate for chemical cauterization which he tolerated without any pain or complication throughout the procedure. Integumentary (Hair, Skin) Wound #1 status is Open. Original cause of wound was Pressure Injury. The wound is located on the Right Gluteal fold.  The wound measures 3.2cm length x 2.3cm width x 3cm depth; 5.781cm^2 area and 17.342cm^3 volume. There is muscle and Fat Layer (Subcutaneous Tissue) Exposed exposed. Tunneling has been noted at 12:00 with a maximum distance of 4.4cm. Undermining begins at 11:00 and ends at 1:00 with a maximum distance of 4.2cm. There is a large amount of serous drainage noted. The wound margin is  epibole. There is large (67-100%) red, pink, hyper - granulation within the wound bed. There is a small (1-33%) amount of necrotic tissue within the wound bed including Adherent Slough. 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 ([id number]) Wound #1 Pre-procedure diagnosis of Wound #1 is a Pressure Ulcer located on the Right Gluteal fold . There was a Excisional Skin/Subcutaneous Tissue Debridement with a total area of 7.36 sq cm performed by [name], HOYT E., [name]. With the following instrument(s): Curette, Forceps, and Scissors to remove Viable and Non-Viable tissue/material. Material removed includes Subcutaneous Tissue and Slough and after achieving pain control using Lidocaine. A time out was conducted at 08:36, prior to the start of the procedure. A Large amount of bleeding was controlled with Pressure. The procedure was tolerated well with a pain level of 0 throughout and a pain level of 0 following the procedure. Post Debridement Measurements: 3.2cm length x 2.3cm width x 3.1cm depth; 17.92cm^3 volume. Post debridement Stage noted as Category/Stage IV. Character of Wound/Ulcer Post Debridement is improved. Post procedure Diagnosis Wound #1: Same as Pre-Procedure Plan Wound Cleansing: Wound #1 Right Gluteal fold: Cleanse wound with mild soap and water Anesthetic (add to Medication List): Wound #1 Right Gluteal fold: Topical Lidocaine 4% cream applied to wound bed prior to debridement (In Clinic Only). Skin Barriers/Peri-Wound Care: Wound #1 Right Gluteal fold: Other: - Apply zinc oxide to peri-wound area. Primary Wound Dressing: Wound #1 Right Gluteal fold: Silver Alginate Pack wound with: - Dakin's 0.25% soaked gauze packed into the wound bed. Place alginate on  top of wound. Secondary Dressing: Wound #1 Right Gluteal fold: ABD pad - Tegaderm to secure Dressing Change Frequency: Wound #1 Right Gluteal fold: Change dressing every day. Follow-up Appointments: Wound #1 Right Gluteal fold: Return Appointment in 1 week. Off-Loading: Wound #1 Right Gluteal fold: Turn and reposition every 2 hours Home Health: Wound #1 Right Gluteal fold: FACE TO FACE ENCOUNTER: MEDICARE and MEDICAID PATIENTS: I certify that this patient is under my care and that I had a face-to-face encounter that meets the physician face-to-face encounter requirements with this patient on this date. The encounter with the patient was in whole or in part for the following MEDICAL CONDITION: (primary reason for [place]) MEDICAL NECESSITY: I certify, that based on my findings, NURSING services are a medically necessary home health service. HOME BOUND STATUS: I certify that my clinical findings support that this patient is homebound (i.e., Due to illness or injury, pt requires aid of supportive devices such as crutches, cane, wheelchairs, walkers, the use of special transportation or the assistance of another person to leave their place of residence. There is a normal inability to leave the home and doing so requires considerable and taxing effort. Other absences are for medical reasons / religious services and are infrequent or of short duration when for other reasons). Negative Pressure Wound Therapy: Wound #1 Right Gluteal fold: Discontinue NPWT. VLAD, MAYBERRY ([id number]) My suggestion at this  point is gonna be that we go ahead and continue with the above wound care measures for the next week and the patient is in agreement with plan. Subsequently he will see were things that at follow-up. If anything changes worsens in the meantime he will contact the office and let me know. Please see above for specific wound care orders. We will see patient for re-evaluation in 1  week(s) here in the clinic. If anything worsens or changes patient will contact our office for additional recommendations. Electronic Signature(s) Signed: 01/10/2019 4:05:21 PM By: Worthy Keeler [name] Entered By: Worthy Keeler on 01/10/2019 09:56:16 XADRIAN, CRAIGHEAD ([id number]) -------------------------------------------------------------------------------- ROS/PFSH Details Patient Name: [name] Date of Service: 01/10/2019 8:00 AM Medical Record Number: [id number] Patient Account Number: [id number] Date of Birth/Sex: April 01, 1951 (68 y.o. M) Treating RN: Harold Barban Primary Care Provider: [name] Other Clinician: Referring Provider: [name] Treating Provider/Extender: [name], HOYT Weeks in Treatment: 66 Information Obtained From Patient Constitutional Symptoms (General Health) Complaints and Symptoms: Negative for: Fatigue; Fever; Chills; Marked Weight Change Respiratory Complaints and Symptoms: Negative for: Chronic or frequent coughs; Shortness of Breath Medical History: Negative for: Aspiration; Asthma; Chronic Obstructive Pulmonary Disease (COPD); Pneumothorax; Sleep Apnea; Tuberculosis Cardiovascular Complaints and Symptoms: Negative for: Chest pain; LE edema Medical History: Positive for: Hypertension - takes medication Negative for: Angina; Arrhythmia; Congestive Heart Failure; Coronary Artery Disease; Deep Vein Thrombosis; Hypotension; Myocardial Infarction; Peripheral Arterial Disease; Peripheral Venous Disease; Phlebitis; Vasculitis Psychiatric Complaints and Symptoms: Negative for: Anxiety; Claustrophobia Medical History: Negative for: Anorexia/bulimia; Confinement Anxiety Eyes Medical History: Positive for: Cataracts - both removed Negative for: Glaucoma; Optic Neuritis Ear/Nose/Mouth/Throat Medical History: Negative for: Chronic sinus problems/congestion; Middle ear problems Hematologic/Lymphatic Medical  History: Negative for: Anemia; Hemophilia; Human Immunodeficiency Virus; Lymphedema DAKOTA, VANWART. ([id number]) Gastrointestinal Medical History: Negative for: Cirrhosis ; Colitis; Crohnos; Hepatitis A; Hepatitis B; Hepatitis C Endocrine Medical History: Negative for: Type I Diabetes; Type II Diabetes Genitourinary Medical History: Negative for: End Stage Renal Disease Immunological Medical History: Negative for: Lupus Erythematosus; Raynaudos; Scleroderma Integumentary (Skin) Medical History: Negative for: History of Burn; History of pressure wounds Musculoskeletal Medical History: Negative for: Gout; Rheumatoid Arthritis; Osteoarthritis; Osteomyelitis Neurologic Medical History: Positive for: Paraplegia - waist down Negative for: Dementia; Neuropathy; Quadriplegia; Seizure Disorder Oncologic Medical History: Negative for: Received Chemotherapy; Received Radiation Past Medical History Notes: Prostate cancer- currently treated with horomone therapy HBO Extended History Items Eyes: Cataracts Immunizations Pneumococcal Vaccine: Received Pneumococcal Vaccination: No Implantable Devices No devices added Family and Social History Cancer: No; Diabetes: No; Heart Disease: No; Hypertension: Yes - Father; Kidney Disease: No; Lung Disease: No; Seizures: No; Stroke: Yes - Mother; Thyroid Problems: No; Tuberculosis: No; Never smoker; Marital Status - Married; Alcohol Use: Never; Drug Use: No History; Caffeine Use: Daily RAMONE, GANDER ([id number]) Physician Affirmation I have reviewed and agree with the above information. Electronic Signature(s) Signed: 01/10/2019 3:57:45 PM By: Harold Barban Signed: 01/10/2019 4:05:21 PM By: Worthy Keeler [name] Entered By: Worthy Keeler on 01/10/2019 09:55:31 Mangen, [name] ([id number]) -------------------------------------------------------------------------------- SuperBill Details Patient Name: [name] Date  of Service: 01/10/2019 Medical Record Number: [id number] Patient Account Number: [id number] Date of Birth/Sex: 04/15/1951 (68 y.o. M) Treating RN: Harold Barban Primary Care Provider: [name] Other Clinician: Referring Provider: [name] Treating Provider/Extender: [name], HOYT Weeks in Treatment: 63 Diagnosis Coding ICD-10 Codes [name] Description L89.314 Pressure ulcer of right buttock, stage 4 L03.317 Cellulitis of buttock G82.20 Paraplegia, unspecified S34.109S Unspecified injury to  unspecified level of lumbar spinal cord, sequela I10 Essential (primary) hypertension Facility Procedures CPT4 [name]: [id number] Description: 67737 - DEB SUBQ TISSUE 20 SQ CM/< ICD-10 Diagnosis Description L89.314 Pressure ulcer of right buttock, stage 4 Modifier: Quantity: 1 Physician Procedures CPT4 [name]: [id number] Description: 47076 - WC PHYS SUBQ TISS 20 SQ CM ICD-10 Diagnosis Description L89.314 Pressure ulcer of right buttock, stage 4 Modifier: Quantity: 1 Electronic Signature(s) Signed: 01/10/2019 4:05:21 PM By: Worthy Keeler [name] Entered By: Worthy Keeler on 01/10/2019 09:56:25

## 2019-01-11 NOTE — Progress Notes (Signed)
[name], [name] ([id number]) Visit Report for 01/10/2019 Arrival Information Details Patient Name: [name], [name] Date of Service: 01/10/2019 8:00 AM Medical Record Number: [id number] Patient Account Number: [id number] Date of Birth/Sex: July 13, 1951 (68 y.o. M) Treating RN: Harold Barban Primary Care Delawrence Fridman: [name] Other Clinician: Referring Maisen Klingler: [name] Treating Eber Ferrufino/Extender: [name], HOYT Weeks in Treatment: 76 Visit Information History Since Last Visit Added or deleted any medications: No Patient Arrived: Wheel Chair Any new allergies or adverse reactions: No Arrival Time: 08:16 Had a fall or experienced change in No Accompanied By: wife activities of daily living that may affect Transfer Assistance: None risk of falls: Patient Identification Verified: Yes Signs or symptoms of abuse/neglect since last visito No Secondary Verification Process Completed: Yes Hospitalized since last visit: No Patient Requires Transmission-Based No Has Dressing in Place as Prescribed: Yes Precautions: Pain Present Now: No Patient Has Alerts: Yes Patient Alerts: NOT Diabetic Electronic Signature(s) Signed: 01/10/2019 3:57:45 PM By: Harold Barban Entered By: Harold Barban on 01/10/2019 08:16:54 [name] ([id number]) -------------------------------------------------------------------------------- Encounter Discharge Information Details Patient Name: [name] Date of Service: 01/10/2019 8:00 AM Medical Record Number: [id number] Patient Account Number: [id number] Date of Birth/Sex: 1951/04/01 (68 y.o. M) Treating RN: Army Melia Primary Care Briceson Broadwater: [name] Other Clinician: Referring Sharon Stapel: [name] Treating Rina Adney/Extender: [name], HOYT Weeks in Treatment: 40 Encounter Discharge Information Items Post Procedure Vitals Discharge Condition: Stable Temperature (F): 99.0 Ambulatory Status:  Walker Pulse (bpm): 81 Discharge Destination: Home Respiratory Rate (breaths/min): 16 Transportation: Private Auto Blood Pressure (mmHg): 126/74 Accompanied By: wife Schedule Follow-up Appointment: Yes Clinical Summary of Care: Electronic Signature(s) Signed: 01/10/2019 9:10:00 AM By: Army Melia Entered By: Army Melia on 01/10/2019 09:09:59 [name] ([id number]) -------------------------------------------------------------------------------- Lower Extremity Assessment Details Patient Name: [name] Date of Service: 01/10/2019 8:00 AM Medical Record Number: [id number] Patient Account Number: [id number] Date of Birth/Sex: 10-06-1950 (68 y.o. M) Treating RN: Harold Barban Primary Care Ross Hefferan: [name] Other Clinician: Referring Karmah Potocki: [name] Treating Ramona Slinger/Extender: [name], HOYT Weeks in Treatment: 59 Electronic Signature(s) Signed: 01/10/2019 3:57:45 PM By: Harold Barban Entered By: Harold Barban on 01/10/2019 08:22:50 [name], [name] ([id number]) -------------------------------------------------------------------------------- Multi Wound Chart Details Patient Name: [name] Date of Service: 01/10/2019 8:00 AM Medical Record Number: [id number] Patient Account Number: [id number] Date of Birth/Sex: 1951/03/02 (68 y.o. M) Treating RN: Harold Barban Primary Care Genavie Boettger: [name] Other Clinician: Referring Salisha Bardsley: [name] Treating Pearlina Friedly/Extender: [name], HOYT Weeks in Treatment: 59 Vital Signs Height(in): 73 Pulse(bpm): 81 Weight(lbs): 210 Blood Pressure(mmHg): 126/74 Body Mass Index(BMI): 28 Temperature(F): 99.0 Respiratory Rate 16 (breaths/min): Photos: [N/A:N/A] Wound Location: Right Gluteal fold N/A N/A Wounding Event: Pressure Injury N/A N/A Primary Etiology: Pressure Ulcer N/A N/A Comorbid History: Cataracts, Hypertension, N/A N/A Paraplegia Date Acquired:  11/02/2017 N/A N/A Weeks of Treatment: 16 N/A N/A Wound Status: Open N/A N/A Measurements L x W x D 3.2x2.3x2.3 N/A N/A (cm) Area (cm) : 5.781 N/A N/A Volume (cm) : 13.295 N/A N/A % Reduction in Area: 42.50% N/A N/A % Reduction in Volume: -1222.90% N/A N/A Position 1 (o'clock): 12 Maximum Distance 1 (cm): 4.4 Tunneling: Yes N/A N/A Classification: Category/Stage IV N/A N/A Exudate Amount: Large N/A N/A Exudate Type: Serous N/A N/A Exudate Color: amber N/A N/A Wound Margin: Epibole N/A N/A Granulation Amount: Large (67-100%) N/A N/A Granulation Quality: Red, Pink, Hyper-granulation N/A N/A Necrotic Amount: Small (1-33%) N/A N/A Exposed Structures: Fat Layer (Subcutaneous N/A N/A Tissue) Exposed: Yes Muscle: Yes Fascia:  No [name], [name] ([id number]) Tendon: No Joint: No Bone: No Epithelialization: None N/A N/A Treatment Notes Electronic Signature(s) Signed: 01/10/2019 3:57:45 PM By: Harold Barban Entered By: Harold Barban on 01/10/2019 08:26:49 [name], [name] ([id number]) -------------------------------------------------------------------------------- [place] Details Patient Name: [name] Date of Service: 01/10/2019 8:00 AM Medical Record Number: [id number] Patient Account Number: [id number] Date of Birth/Sex: 04-11-1951 (68 y.o. M) Treating RN: Harold Barban Primary Care Gagan Dillion: [name] Other Clinician: Referring Ester Mabe: [name] Treating Kamsiyochukwu Spickler/Extender: [name], HOYT Weeks in Treatment: 72 Active Inactive Orientation to the Wound Care Program Nursing Diagnoses: Knowledge deficit related to the wound healing center program Goals: Patient/caregiver will verbalize understanding of the [place] Program Date Initiated: 11/30/2017 Target Resolution Date: 12/21/2017 Goal Status: Active Interventions: Provide education on orientation to the wound center Notes: Pressure Nursing  Diagnoses: Knowledge deficit related to management of pressures ulcers Goals: Patient/caregiver will verbalize understanding of pressure ulcer management Date Initiated: 05/17/2018 Target Resolution Date: 05/28/2018 Goal Status: Active Interventions: Assess: immobility, friction, shearing, incontinence upon admission and as needed Notes: Wound/Skin Impairment Nursing Diagnoses: Impaired tissue integrity Goals: Patient/caregiver will verbalize understanding of skin care regimen Date Initiated: 11/30/2017 Target Resolution Date: 12/21/2017 Goal Status: Active Ulcer/skin breakdown will have a volume reduction of 30% by week 4 Date Initiated: 11/30/2017 Target Resolution Date: 12/21/2017 Goal Status: Active [name], [name] ([id number]) Interventions: Assess patient/caregiver ability to obtain necessary supplies Assess patient/caregiver ability to perform ulcer/skin care regimen upon admission and as needed Assess ulceration(s) every visit Treatment Activities: Skin care regimen initiated : 11/30/2017 Notes: Electronic Signature(s) Signed: 01/10/2019 3:57:45 PM By: Harold Barban Entered By: Harold Barban on 01/10/2019 08:26:25 [name] ([id number]) -------------------------------------------------------------------------------- Pain Assessment Details Patient Name: [name] Date of Service: 01/10/2019 8:00 AM Medical Record Number: [id number] Patient Account Number: [id number] Date of Birth/Sex: 25-Jun-1951 (68 y.o. M) Treating RN: Harold Barban Primary Care Britne Borelli: [name] Other Clinician: Referring Undine Nealis: [name] Treating Collie Kittel/Extender: [name], HOYT Weeks in Treatment: 6 Active Problems Location of Pain Severity and Description of Pain Patient Has Paino No Site Locations Pain Management and Medication Current Pain Management: Electronic Signature(s) Signed: 01/10/2019 3:57:45 PM By: Harold Barban Entered By: Harold Barban on 01/10/2019 08:17:05 [name] ([id number]) -------------------------------------------------------------------------------- Patient/Caregiver Education Details Patient Name: [name] Date of Service: 01/10/2019 8:00 AM Medical Record Number: [id number] Patient Account Number: [id number] Date of Birth/Gender: [birth year] (68 y.o. M) Treating RN: Harold Barban Primary Care Physician: [name] Other Clinician: Referring Physician: [name] Treating Physician/Extender: Sharalyn Ink in Treatment: 53 Education Assessment Education Provided To: Patient Education Topics Provided Wound/Skin Impairment: Handouts: Caring for Your Ulcer Methods: Demonstration, Explain/Verbal Responses: State content correctly Electronic Signature(s) Signed: 01/10/2019 3:57:45 PM By: Harold Barban Entered By: Harold Barban on 01/10/2019 08:32:54 [name] ([id number]) -------------------------------------------------------------------------------- Wound Assessment Details Patient Name: [name] Date of Service: 01/10/2019 8:00 AM Medical Record Number: [id number] Patient Account Number: [id number] Date of Birth/Sex: [birth year] (68 y.o. M) Treating RN: Harold Barban Primary Care Quinisha Mould: [name] Other Clinician: Referring Yasiel Goyne: [name] Treating Marzella Miracle/Extender: [name], HOYT Weeks in Treatment: 58 Wound Status Wound Number: 1 Primary Etiology: Pressure Ulcer Wound Location: Right Gluteal fold Wound Status: Open Wounding Event: Pressure Injury Comorbid History: Cataracts, Hypertension, Paraplegia Date Acquired: 11/02/2017 Weeks Of Treatment: 58 Clustered Wound: No Photos Wound Measurements Length: (cm) 3.2 Width: (cm) 2.3 Depth: (cm) 3 Area: (cm) 5.781 Volume: (cm) 17.342 % Reduction in Area: 42.5% %  Reduction in Volume: -1625.6% Epithelialization: None Tunneling: Yes Position  (o'clock): 12 Maximum Distance: (cm) 4.4 Undermining: Yes Starting Position (o'clock): 11 Ending Position (o'clock): 1 Maximum Distance: (cm) 4.2 Wound Description Classification: Category/Stage IV Foul Odo Wound Margin: Epibole Slough/F Exudate Amount: Large Exudate Type: Serous Exudate Color: amber r After Cleansing: No ibrino Yes Wound Bed Granulation Amount: Large (67-100%) Exposed Structure Granulation Quality: Red, Pink, Hyper-granulation Fascia Exposed: No Necrotic Amount: Small (1-33%) Fat Layer (Subcutaneous Tissue) Exposed: Yes [name], [name] ([id number]) Necrotic Quality: Adherent Slough Tendon Exposed: No Muscle Exposed: Yes Necrosis of Muscle: No Joint Exposed: No Bone Exposed: No Treatment Notes Wound #1 (Right Gluteal fold) Notes Dakins wet-to-dry, ABD, Tegaderm Electronic Signature(s) Signed: 01/10/2019 3:57:45 PM By: Harold Barban Entered By: Harold Barban on 01/10/2019 08:32:32 [name] ([id number]) -------------------------------------------------------------------------------- Vitals Details Patient Name: [name] Date of Service: 01/10/2019 8:00 AM Medical Record Number: [id number] Patient Account Number: [id number] Date of Birth/Sex: 02/25/1951 (68 y.o. M) Treating RN: Harold Barban Primary Care Chelsee Hosie: [name] Other Clinician: Referring Ovella Manygoats: [name] Treating Arrianna Catala/Extender: [name], HOYT Weeks in Treatment: 58 Vital Signs Time Taken: 08:05 Temperature (F): 99.0 Height (in): 73 Pulse (bpm): 81 Weight (lbs): 210 Respiratory Rate (breaths/min): 16 Body Mass Index (BMI): 27.7 Blood Pressure (mmHg): 126/74 Reference Range: 80 - 120 mg / dl Electronic Signature(s) Signed: 01/10/2019 3:57:45 PM By: Harold Barban Entered By: Harold Barban on 01/10/2019 10:30:13

## 2019-01-17 ENCOUNTER — Other Ambulatory Visit: Payer: Self-pay

## 2019-01-17 ENCOUNTER — Encounter: Payer: Medicare Other | Admitting: Physician Assistant

## 2019-01-17 DIAGNOSIS — L89314 Pressure ulcer of right buttock, stage 4: Secondary | ICD-10-CM | POA: Diagnosis not present

## 2019-01-19 NOTE — Progress Notes (Signed)
JABES, PRIMO ([id number]) Visit Report for 01/17/2019 Arrival Information Details Patient Name: [name], [name] Date of Service: 01/17/2019 8:00 AM Medical Record Number: [id number] Patient Account Number: [id number] Date of Birth/Sex: 1951/02/25 (68 y.o. M) Treating RN: [name] Primary Care Petrea Fredenburg: [name] Other Clinician: Referring Bralynn Velador: [name] Treating Stasia Somero/Extender: [name], HOYT Weeks in Treatment: 58 Visit Information History Since Last Visit Added or deleted any medications: No Patient Arrived: Wheel Chair Any new allergies or adverse reactions: No Arrival Time: 08:07 Had a fall or experienced change in No Accompanied By: wife activities of daily living that may affect Transfer Assistance: None risk of falls: Patient Identification Verified: Yes Signs or symptoms of abuse/neglect since last visito No Secondary Verification Process Completed: Yes Hospitalized since last visit: No Patient Requires Transmission-Based No Has Dressing in Place as Prescribed: Yes Precautions: Pain Present Now: Yes Patient Has Alerts: Yes Patient Alerts: NOT Diabetic Electronic Signature(s) Signed: 01/17/2019 4:46:34 PM By: Gretta Cool, BSN, RN, CWS, Kim RN, BSN Entered By: Gretta Cool, BSN, RN, CWS, Kim on 01/17/2019 08:07:26 [name], [name] ([id number]) -------------------------------------------------------------------------------- Encounter Discharge Information Details Patient Name: [name] Date of Service: 01/17/2019 8:00 AM Medical Record Number: [id number] Patient Account Number: [id number] Date of Birth/Sex: [birth year] (68 y.o. M) Treating RN: Army Melia Primary Care Gal Smolinski: [name] Other Clinician: Referring Bodey Frizell: [name] Treating Lenya Sterne/Extender: [name], HOYT Weeks in Treatment: 51 Encounter Discharge Information Items Post Procedure Vitals Discharge Condition: Stable Temperature (F):  98.5 Ambulatory Status: Wheelchair Pulse (bpm): 78 Discharge Destination: Home Respiratory Rate (breaths/min): 16 Transportation: Private Auto Blood Pressure (mmHg): 138/78 Accompanied By: wife Schedule Follow-up Appointment: Yes Clinical Summary of Care: Electronic Signature(s) Signed: 01/17/2019 11:27:42 AM By: Army Melia Entered By: Army Melia on 01/17/2019 08:56:20 [name] ([id number]) -------------------------------------------------------------------------------- Lower Extremity Assessment Details Patient Name: [name] Date of Service: 01/17/2019 8:00 AM Medical Record Number: [id number] Patient Account Number: [id number] Date of Birth/Sex: 1950/10/10 (68 y.o. M) Treating RN: [name] Primary Care Jakhi Dishman: [name] Other Clinician: Referring Jaylinn Hellenbrand: [name] Treating Chyanne Kohut/Extender: Sharalyn Ink in Treatment: 65 Electronic Signature(s) Signed: 01/17/2019 4:46:34 PM By: Gretta Cool, BSN, RN, CWS, Kim RN, BSN Entered By: Gretta Cool, BSN, RN, CWS, Kim on 01/17/2019 08:16:09 [name], [name] ([id number]) -------------------------------------------------------------------------------- Multi Wound Chart Details Patient Name: [name] Date of Service: 01/17/2019 8:00 AM Medical Record Number: [id number] Patient Account Number: [id number] Date of Birth/Sex: 1951-06-06 (68 y.o. M) Treating RN: Harold Barban Primary Care Kaya Pottenger: [name] Other Clinician: Referring Wende Longstreth: [name] Treating Aidian Salomon/Extender: [name], HOYT Weeks in Treatment: 69 Vital Signs Height(in): 73 Pulse(bpm): 78 Weight(lbs): 210 Blood Pressure(mmHg): 139/79 Body Mass Index(BMI): 28 Temperature(F): 98.5 Respiratory Rate 16 (breaths/min): Photos: [N/A:N/A] Wound Location: Right Gluteal fold N/A N/A Wounding Event: Pressure Injury N/A N/A Primary Etiology: Pressure Ulcer N/A N/A Comorbid History: Cataracts,  Hypertension, N/A N/A Paraplegia Date Acquired: 11/02/2017 N/A N/A Weeks of Treatment: 59 N/A N/A Wound Status: Open N/A N/A Measurements L x W x D 2.7x2.7x2.5 N/A N/A (cm) Area (cm) : 5.726 N/A N/A Volume (cm) : 14.314 N/A N/A % Reduction in Area: 43.00% N/A N/A % Reduction in Volume: -1324.30% N/A N/A Starting Position 1 10 (o'clock): Ending Position 1 2 (o'clock): Maximum Distance 1 (cm): 4.5 Undermining: Yes N/A N/A Classification: Category/Stage IV N/A N/A Exudate Amount: Large N/A N/A Exudate Type: Serous N/A N/A Exudate Color: amber N/A N/A Wound Margin: Epibole N/A N/A Granulation Amount: Large (67-100%) N/A N/A Granulation Quality: Red, Pink,  Hyper-granulation N/A N/A Necrotic Amount: Small (1-33%) N/A N/A Exposed Structures: N/A N/A [name], [name] ([id number]) Fat Layer (Subcutaneous Tissue) Exposed: Yes Muscle: Yes Fascia: No Tendon: No Joint: No Bone: No Epithelialization: None N/A N/A Treatment Notes Electronic Signature(s) Signed: 01/17/2019 4:40:23 PM By: Harold Barban Entered By: Harold Barban on 01/17/2019 08:24:33 [name] ([id number]) -------------------------------------------------------------------------------- [place] Details Patient Name: [name] Date of Service: 01/17/2019 8:00 AM Medical Record Number: [id number] Patient Account Number: [id number] Date of Birth/Sex: 05-15-1951 (68 y.o. M) Treating RN: Harold Barban Primary Care Renee Beale: [name] Other Clinician: Referring Johnnathan Hagemeister: [name] Treating Arayah Krouse/Extender: [name], HOYT Weeks in Treatment: 63 Active Inactive Orientation to the Wound Care Program Nursing Diagnoses: Knowledge deficit related to the wound healing center program Goals: Patient/caregiver will verbalize understanding of the [place] Program Date Initiated: 11/30/2017 Target Resolution Date: 12/21/2017 Goal Status:  Active Interventions: Provide education on orientation to the wound center Notes: Pressure Nursing Diagnoses: Knowledge deficit related to management of pressures ulcers Goals: Patient/caregiver will verbalize understanding of pressure ulcer management Date Initiated: 05/17/2018 Target Resolution Date: 05/28/2018 Goal Status: Active Interventions: Assess: immobility, friction, shearing, incontinence upon admission and as needed Notes: Wound/Skin Impairment Nursing Diagnoses: Impaired tissue integrity Goals: Patient/caregiver will verbalize understanding of skin care regimen Date Initiated: 11/30/2017 Target Resolution Date: 12/21/2017 Goal Status: Active Ulcer/skin breakdown will have a volume reduction of 30% by week 4 Date Initiated: 11/30/2017 Target Resolution Date: 12/21/2017 Goal Status: Active [name], [name] ([id number]) Interventions: Assess patient/caregiver ability to obtain necessary supplies Assess patient/caregiver ability to perform ulcer/skin care regimen upon admission and as needed Assess ulceration(s) every visit Treatment Activities: Skin care regimen initiated : 11/30/2017 Notes: Electronic Signature(s) Signed: 01/17/2019 4:40:23 PM By: Harold Barban Entered By: Harold Barban on 01/17/2019 08:24:25 [name] ([id number]) -------------------------------------------------------------------------------- Pain Assessment Details Patient Name: [name] Date of Service: 01/17/2019 8:00 AM Medical Record Number: [id number] Patient Account Number: [id number] Date of Birth/Sex: Nov 14, 1950 (68 y.o. M) Treating RN: [name] Primary Care Auriah Hollings: [name] Other Clinician: Referring Jaysten Essner: [name] Treating Karman Veney/Extender: [name], HOYT Weeks in Treatment: 11 Active Problems Location of Pain Severity and Description of Pain Patient Has Paino Yes Site Locations Pain Location: Pain in Ulcers Rate the  pain. Current Pain Level: 4 Pain Management and Medication Current Pain Management: Electronic Signature(s) Signed: 01/17/2019 4:46:34 PM By: Gretta Cool, BSN, RN, CWS, Kim RN, BSN Entered By: Gretta Cool, BSN, RN, CWS, Kim on 01/17/2019 08:07:52 [name], [name] ([id number]) -------------------------------------------------------------------------------- Patient/Caregiver Education Details Patient Name: [name] Date of Service: 01/17/2019 8:00 AM Medical Record Number: [id number] Patient Account Number: [id number] Date of Birth/Gender: 04-23-1951 (68 y.o. M) Treating RN: Harold Barban Primary Care Physician: [name] Other Clinician: Referring Physician: [name] Treating Physician/Extender: Sharalyn Ink in Treatment: 51 Education Assessment Education Provided To: Patient Education Topics Provided Pressure: Handouts: Pressure Ulcers: Care and Offloading Methods: Demonstration, Explain/Verbal Responses: State content correctly Wound/Skin Impairment: Handouts: Caring for Your Ulcer Methods: Demonstration, Explain/Verbal Responses: State content correctly Electronic Signature(s) Signed: 01/17/2019 4:40:23 PM By: Harold Barban Entered By: Harold Barban on 01/17/2019 08:25:01 [name] ([id number]) -------------------------------------------------------------------------------- Wound Assessment Details Patient Name: [name] Date of Service: 01/17/2019 8:00 AM Medical Record Number: [id number] Patient Account Number: [id number] Date of Birth/Sex: Sep 29, 1950 (68 y.o. M) Treating RN: [name] Primary Care Ismail Graziani: [name] Other Clinician: Referring Macel Yearsley: [name] Treating Caylea Foronda/Extender: [name], HOYT Weeks in Treatment: 59 Wound Status Wound Number:  1 Primary Etiology: Pressure Ulcer Wound Location: Right Gluteal fold Wound Status: Open Wounding Event: Pressure Injury Comorbid History:  Cataracts, Hypertension, Paraplegia Date Acquired: 11/02/2017 Weeks Of Treatment: 59 Clustered Wound: No Photos Wound Measurements Length: (cm) 2.7 Width: (cm) 2.7 Depth: (cm) 2.5 Area: (cm) 5.726 Volume: (cm) 14.314 % Reduction in Area: 43% % Reduction in Volume: -1324.3% Epithelialization: None Undermining: Yes Starting Position (o'clock): 10 Ending Position (o'clock): 2 Maximum Distance: (cm) 4.5 Wound Description Classification: Category/Stage IV Foul Odo Wound Margin: Epibole Slough/F Exudate Amount: Large Exudate Type: Serous Exudate Color: amber r After Cleansing: No ibrino Yes Wound Bed Granulation Amount: Large (67-100%) Exposed Structure Granulation Quality: Red, Pink, Hyper-granulation Fascia Exposed: No Necrotic Amount: Small (1-33%) Fat Layer (Subcutaneous Tissue) Exposed: Yes Necrotic Quality: Adherent Slough Tendon Exposed: No Muscle Exposed: Yes Necrosis of Muscle: No Joint Exposed: No [name], HUTSELL. ([id number]) Bone Exposed: No Treatment Notes Wound #1 (Right Gluteal fold) Notes Dakins wet-to-dry, ABD, Tegaderm Electronic Signature(s) Signed: 01/17/2019 4:46:34 PM By: Gretta Cool, BSN, RN, CWS, Kim RN, BSN Entered By: Gretta Cool, BSN, RN, CWS, Kim on 01/17/2019 08:15:56 [name], [name] ([id number]) -------------------------------------------------------------------------------- Vitals Details Patient Name: [name] Date of Service: 01/17/2019 8:00 AM Medical Record Number: [id number] Patient Account Number: [id number] Date of Birth/Sex: 1951/04/03 (68 y.o. M) Treating RN: [name] Primary Care Mervin Ramires: [name] Other Clinician: Referring Tylee Newby: [name] Treating Jeffey Janssen/Extender: [name], HOYT Weeks in Treatment: 73 Vital Signs Time Taken: 08:07 Temperature (F): 98.5 Height (in): 73 Pulse (bpm): 78 Weight (lbs): 210 Respiratory Rate (breaths/min): 16 Body Mass Index (BMI): 27.7 Blood Pressure  (mmHg): 139/79 Reference Range: 80 - 120 mg / dl Electronic Signature(s) Signed: 01/17/2019 4:46:34 PM By: Gretta Cool, BSN, RN, CWS, Kim RN, BSN Entered By: Gretta Cool, BSN, RN, CWS, Kim on 01/17/2019 08:08:10

## 2019-01-19 NOTE — Progress Notes (Signed)
KAMARIE, PALMA ([id number]) Visit Report for 01/17/2019 Chief Complaint Document Details Patient Name: KAMDYN, COLBORN Date of Service: 01/17/2019 8:00 AM Medical Record Number: [id number] Patient Account Number: [id number] Date of Birth/Sex: [birth year] (68 y.o. M) Treating RN: Harold Barban Primary Care Provider: [name] Other Clinician: Referring Provider: [name] Treating Provider/Extender: [name], HOYT Weeks in Treatment: 50 Information Obtained from: Patient Chief Complaint Right gluteal fold ulcer Electronic Signature(s) Signed: 01/19/2019 12:14:37 AM By: Worthy Keeler [name] Entered By: Worthy Keeler on 01/17/2019 08:21:32 HOLLEY, WIRT ([id number]) -------------------------------------------------------------------------------- Debridement Details Patient Name: [name] Date of Service: 01/17/2019 8:00 AM Medical Record Number: [id number] Patient Account Number: [id number] Date of Birth/Sex: [birth year] (68 y.o. M) Treating RN: Harold Barban Primary Care Provider: [name] Other Clinician: Referring Provider: [name] Treating Provider/Extender: [name], HOYT Weeks in Treatment: 8 Debridement Performed for Wound #1 Right Gluteal fold Assessment: Performed By: Physician [name], HOYT E., [name] Debridement Type: Debridement Level of Consciousness (Pre- Awake and Alert procedure): Pre-procedure Verification/Time Yes - 08:26 Out Taken: Start Time: 08:26 Pain Control: Lidocaine Total Area Debrided (L x W): 2.7 (cm) x 2.7 (cm) = 7.29 (cm) Tissue and other material Viable, Non-Viable, Slough, Subcutaneous, Slough debrided: Level: Skin/Subcutaneous Tissue Debridement Description: Excisional Instrument: Curette Bleeding: Moderate Hemostasis Achieved: Pressure End Time: 08:30 Procedural Pain: 0 Post Procedural Pain: 0 Response to Treatment: Procedure was tolerated well Level of  Consciousness Awake and Alert (Post-procedure): Post Debridement Measurements of Total Wound Length: (cm) 2.7 Stage: Category/Stage IV Width: (cm) 2.7 Depth: (cm) 2.5 Volume: (cm) 14.314 Character of Wound/Ulcer Post Improved Debridement: Post Procedure Diagnosis Same as Pre-procedure Electronic Signature(s) Signed: 01/17/2019 4:40:23 PM By: Harold Barban Signed: 01/19/2019 12:14:37 AM By: Worthy Keeler [name] Entered By: Harold Barban on 01/17/2019 08:29:51 HEWITT, GARNER ([id number]) -------------------------------------------------------------------------------- HPI Details Patient Name: [name] Date of Service: 01/17/2019 8:00 AM Medical Record Number: [id number] Patient Account Number: [id number] Date of Birth/Sex: 01-21-1951 (68 y.o. 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 He states that there is no history of diabetes that he is aware of. He does have issues with his prostate and is currently receiving treatment for this by way of oral medication. With that being said I do not have a lot of details in that regard. Nonetheless the patient presents today as a result of having been referred to [place] by another provider initially home health was set to come out and take care of his wound although due to the fact that he apparently drives he's not able to receive home health. His wife is therefore trying to help take care of this wound within although they have been struggling with what exactly to do at this  point. She states that she can do some things but she is definitely not a nurse and does have some issues with looking at blood. The good news is the wound does not appear to be too deep and is fairly superficial at this point. There is no slough noted there is some nonviable skin noted around the surface of the wound  and the perimeter at this point. The central portion of the wound appears to be very good with a dermal layer noted this does not appear to be again deep enough to extend it to subcutaneous tissue at this point. 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 In the pump he has Dilaudid and baclofen. 12/07/17 on evaluation today patient presents for follow-up concerning his ongoing lower back thigh ulcer on the right. He states that he did not get the supplies ordered and therefore has not really been able to perform the dressing changes as directed exactly. His wife was able to get some Boarder Foam Dressing's from the drugstore and subsequently has been using hydrogel which did help to a degree in the wound does appear to be able smaller. There is actually more drainage this week noted than previous. 12/21/17 on evaluation today patient appears to be doing rather well in regard to his right gluteal ulcer. He has been tolerating the dressing changes without complication. There does not appear to be any evidence of infection at this point in time. Overall the wound does seem to be making some progress as far as the edges are concerned there's not as much in the way of overlapping of the external wound edges and he has a good epithelium to wound bed border for the most part. This however is not true right at the 12 o'clock location over the span of a little over a centimeters which actually will require debridement today to clean this away and hopefully allow it to continue to heal more  appropriately. 12/28/17 on evaluation today patient appears to be doing rather well in regard to his ulcer in the left gluteal region. He's been tolerating the dressing changes without complication. Apparently he has had some difficulty getting his dressing material. Apparently there's been some confusion with ordering we're gonna check into this. Nonetheless overall he's been showing signs of improvement which is good news. Debridement is not required today. 01/04/18 on evaluation today patient presents for follow-up concerning his right gluteal ulcer. He has been tolerating the dressing changes fairly well. On inspection today it appears he may actually have some maceration them concerned about the fact that he may be developing too much moisture in and around the wound bed which can cause delay in healing. With that being said he unfortunately really has not showed significant signs of improvement since last week's evaluation in fact this may even be just the little bit/slightly larger. Nonetheless he's been having a lot of discomfort I'm not sure this is even related to the wound as he has no pain when I'm to breeding or otherwise cleaning the wound during evaluation today. Nonetheless this is something that we did recommend he talked to his pain specialist concerning. 01/11/18 on evaluation today patient appears to be doing better in regard to his ulceration. He has been tolerating the dressing changes without complication. With that being said overall there's no evidence of infection which is good news. The only thing is he did receive the hatch affair blue classic versus the ready nonetheless I feel like this is perfectly fine and appears to have done well for him over the past week. 01/25/18 on evaluation today patient's wound actually appears to be a little bit larger than during the last evaluation. The good ETHER, WOLTERS. ([id number]) news is the majority of the wound edges  actually  appear to be fairly firmly attached to the wound bed unfortunately again we're not really making progress in regard to the size. Roughly the wound is about the same size as when I first saw him although again the wound margin/edges appear to be much better. 02/01/18 on evaluation today patient actually appears to be doing very well in regard to his wound. Applying the Prisma dry does seem to be better although he does still have issues with slow progression of the wound. There was a slight improvement compared to last week's measurements today. Nonetheless I have been considering other options as far as the possibility of Theraskin or even a snap vac. In general I'm not sure that the Theraskin due to location of the wound would be a very good idea. Nonetheless I do think that a snap vac could be a possibility for the patient and in fact I think this could even be an excellent way to manage the wound possibly seeing some improvement in a very rapid fashion here. Nonetheless this is something that we would need to get approved and I did have a lengthy conversation with the patient about this today. 02/08/18 on evaluation today patient appears to be doing a little better in regard to his ulcer. He has been tolerating the dressing changes without complication. Fortunately despite the fact that the wound is a little bit smaller it's not significantly so unfortunately. We have discussed the possibility of a snap vac we did check with insurance this is actually covered at this point. Fortunately there does not appear to be any sign of infection. Overall I'm fairly pleased with how things seem to be appearing at this point. 02/15/18 on evaluation today patient appears to be doing rather well in regard to his right gluteal ulcer. Unfortunately the snap vac did not stay in place with his sheer and friction this came loose and did not seem to maintain seal very well. He worked for about two days and it did seem  to do very well during that time according to his wife but in general this does not seem to be something that's gonna be beneficial for him long-term. I do believe we need to go back to standard dressings to see if we can find something that will be of benefit. 03/02/18- He is here in follow up evaluation; there is minimal change in the wound. He will continue with the same treatment plan, would consider changing to iodosrob/iodoflex if ulcer continues to to plateau. He will follow up next week 03/08/18 on evaluation today patient's wound actually appears to be about the same size as when I previously saw him several weeks back. Unfortunately he does have some slightly dark discoloration in the central portion of the wound which has me concerned about pressure injury. I do believe he may be sitting for too long a period of time in fact he tells me that "I probably sit for much too long". He does have some Slough noted on the surface of the wound and again as far as the size of the wound is concerned I'm really not seeing anything that seems to have improved significantly. 03/15/18 on evaluation today patient appears to be doing fairly well in regard to his ulcer. The wound measured pretty much about the same today compared to last week's evaluation when looking at his graph. With that being said the area of bruising/deep tissue injury that was noted last week I do not see at this point.  He did get a new cushion fortunately this does seem to be have been of benefit in my pinion. It does appear that he's been off of this more which is good news as well I think that is definitely showing in the overall wound measurements. With that being said I do believe that he needs to continue to offload I don't think that the fact this is doing better should be or is going to allow him to not have to offload and explain this to him as well. Overall he seems to be in agreement the plan I think he understands. The  overall appearance of the wound bed is improved compared to last week I think the Iodoflex has been beneficial in that regard. 03/29/18 on evaluation today patient actually appears to be doing rather well in regard to his wound from the overall appearance standpoint he does have some granulation although there's some Slough on the surface of the wound noted as well. With that being said he unfortunately has not improved in regard to the overall measurement of the wound in volume or in size. I did have a discussion with him very specifically about offloading today. He actually does work although he mainly is just sitting throughout the day. He tells me he offloads by "lifting himself up for 30 seconds off of his chair occasionally" purchase from [employer] which does seem to have helped. And he has a new cushion that he with that being said he's also able to stand some for a very short period of time but not significant enough I think to provide appropriate offloading. I think the biggest issue at this point with the wound and the fact is not healing as quickly as we would like is due to the fact that he is really not able to appropriately offload while at work. He states the beginning after his injury he actually had a bed at his job that he could lay on in order to offload and that does seem to have been of help back at that time. Nonetheless he had not done this in quite some time unfortunately. I think that could be helpful for him this is something I would like for him to look into. 04/05/18 on evaluation today patient actually presents for follow-up concerning his right gluteal ulcer. Again he really is not significantly improved even compared to last week. He has been tolerating the dressing changes without complication. With that being said fortunately there appears to be no evidence of infection at this time. He has been more proactive in trying to offload. MINNIE, SHI  ([id number]) 04/12/18 on evaluation today patient actually appears to be doing a little better in regard to his wound and the right gluteal fold region. He's been tolerating the dressing changes since removing the oasis without complication. However he was having a lot of burning initially with the oasis in place. He's unsure of exactly why this was given so much discomfort but he assumes that it was the oasis itself causing the problem. Nonetheless this had to be removed after about three days in place although even those three days seem to have made a fairly good improvement in regard to the overall appearance of the wound bed. In fact is the first time that he's made any improvement from the standpoint of measurements in about six weeks. He continues to have no discomfort over the area of the wound itself which leads me to wonder why he was having the  burning with the oasis when he does not even feel the actual debridement's themselves. I am somewhat perplexed by this. 04/19/18 on evaluation today patient's wound actually appears to be showing signs of epithelialization around the edge of the wound and in general actually appears to be doing better which is good news. He did have the same burning after about three days with applying the Endoform last week in the same fashion that I would generally apply a skin substitute. This seems to indicate that it's not the oasis to cause the problem but potentially the moisture buildup that just causes things to burn or there may be some other reaction with the skin prep or Steri-Strips. Nonetheless I'm not sure that is gonna be able to tolerate any skin substitute for a long period of time. The good news is the wound actually appears to be doing better today compared to last week and does seem to finally be making some progress. 04/26/18 on evaluation today patient actually appears to be doing rather well in regard to his ulcer in the right gluteal fold.  He has been tolerating the dressing changes without complication which is good news. The Endoform does seem to be helping although he was a little bit more macerated this week. This seems to be an ongoing issue with fluid control at this point. Nonetheless I think we may be able to add something like Drawtex to help control the drainage. 05/03/18 on evaluation today patient appears to actually be doing better in regard to the overall appearance of his wound. He has been tolerating the dressing changes without complication. Fortunately there appears to be no evidence of infection at this time. I really feel like his wound has shown signs as of today of turning around last week I thought so as well and definitely he could be seen in this week's overall appearance and measurements. In general I'm very pleased with the fact that he finally seems to be making a steady but sure progress. The patient likewise is very pleased. 05/17/18 on evaluation today patient appears to be doing more poorly unfortunately in regard to his ulcer. He has been tolerating the dressing changes without complication. With that being said he tells me that in the past couple of days he and his wife have noticed that we did not seem to be doing quite as well is getting dark near the center. Subsequently upon evaluation today the wound actually does appear to be doing worse compared to previous. He has been tolerating the dressing changes otherwise and he states that he is not been sitting up anymore than he was in the past from what he tells me. Still he has continued to work he states "I'm tired of dealing with this and if I have to just go home and lay in the bed all the time that's what I'll do". Nonetheless I am concerned about the fact that this wound does appear to be deeper than what it was previous. 05/24/18 upon evaluation today patient actually presents after having been in the hospital due to what was presumed to  be sepsis secondary to the wound infection. He had an elevated white blood cell count between 14 and 15. With that being said he does seem to be doing somewhat better now. His wound still is giving him some trouble nonetheless and he is obviously concerned about the fact likely talked about that this does seem to go more deeply than previously noted. I did review his wound  culture which showed evidence of Staphylococcus aureus him and group B strep. Nonetheless he is on antibiotics, Levaquin, for this. Subsequently I did review his intake summary from the hospital as well. I also did look at the CT of the lumbar spine with contrast that was performed which showed no bone destruction to suggest lumbar disguises/osteomyelitis or sacral osteomyelitis. There was no paraspinal abscess. Nonetheless it appears this may have been more of just a soft tissue infection at this point which is good news. He still is nonetheless concerned about the wound which again I think is completely reasonable considering everything he's been through recently. 05/31/18 on evaluation today on evaluation today patient actually appears to be showing signs of his wound be a little bit deeper than what I would like to see. Fortunately he does not show any signs of significant infection although his temperature was 99 today he states he's been checking this at home and has not been elevated. Nonetheless with the undermining that I'm seeing at this point I am becoming more concerned about the wound I do think that offloading is a key factor here that is preventing the speedy recovery at this point. There does not appear to be any evidence of again over infection noted. He's been using Santyl currently. 06/07/18 the patient presents today for follow-up evaluation regarding the left ulcer in the gluteal region. He has been tolerating the Wound VAC fairly well. He is obviously very frustrated with this he states that to mean is really  getting in his way. There does not appear to be any evidence of infection at this time he does have a little bit of odor I do not necessarily associate this with infection just something that we sometimes notice with Wound VAC therapy. With that being said I can definitely catch a tone of discontentment overall in the patient's demeanor today. This when he was previously in the hospital HARDEEP, REETZ. ([id number]) an CT scan was done of the lumbar region which did not reveal any signs of osteomyelitis. With that being said the pelvis in particular was not evaluated distinctly which means he could still have some osteonecrosis I. Nonetheless the Wound VAC was started on Thursday I do want to get this little bit more time before jumping to a CT scan of the pelvis although that is something that I might would recommend if were not see an improvement by that time. 06/14/18 on evaluation today patient actually appears to be doing about the same in regard to his right gluteal ulcer. Again he did have a CT scan of the lumbar spine unfortunately this did not include the pelvis. Nonetheless with the depth of the wound that I'm seeing today even despite the fact that I'm not seeing any evidence of overt cellulitis I believe there's a good chance that we may be dealing with osteomyelitis somewhere in the right Ischial region. No fevers, chills, nausea, or vomiting noted at this time. 06/21/18 on evaluation today patient actually appears to be doing about the same with regard to his wound. The tunnel at 6 o'clock really does not appear to be any deeper although it is a little bit wider. I think at this point you may want to start packing this with white phone. Unfortunately I have not got approval for the CT scan of the pelvis as of yet due to the fact that Medicare apparently has been denied it due to the diagnosis codes not being appropriate according to Medicare for the  test requested. With that being  said the patient cannot have an MRI and therefore this is the only option that we have as far as testing is concerned. The patient has had infection and was on antibiotics and been added code for cellulitis of the bottom to see if this will be appropriate for getting the test approved. Nonetheless I'm concerned about the infection have been spread deeper into the Ischial region. 06/28/18 on evaluation today patient actually appears to be doing rather well all things considered in regard to the right gluteal ulcer. He has been tolerating the dressing changes without complication. With that being said the Wound VAC he states does have to be replaced almost every day or at least reinforced unfortunately. Patient actually has his CT scan later this morning we should have the results by tomorrow. 07/05/18 on evaluation today patient presents for follow-up concerning his right Ischial ulcer. He did see the surgeon Dr. Lysle Pearl last week. They were actually very happy with him and felt like he spent a tremendous amount of time with them as far as discussing his situation was concerned. In the end Dr. Lysle Pearl did contact me as well and determine that he would not recommend any surgical intervention at this point as he felt like it would not be in the patient's best interest based on what he was seeing. He recommended a referral to infectious disease. Subsequently this is something that Dr. Ines Bloomer office is working on setting up for the patient. As far as evaluation today is concerned the patient's wound actually appears to be worse at this point. I am concerned about how things are progressing and specifically about infection. I do not feel like it's the deeper but the area of depth is definitely widening which does have me concerned. No fevers, chills, nausea, or vomiting noted at this time. I think that we do need initiate antibiotic therapy the patient has an allow allergy to amoxicillin/penicillin he states  that he gets a rash since childhood. Nonetheless she's never had the issues with Catholics or cephalosporins in general but he is aware of. 07/27/18 on evaluation today patient presents following admission to the hospital on 07/09/18. He was subsequently discharged on 07/20/18. On 07/15/18 the patient underwent irrigation and debridement was soft tissue biopsy and bone biopsy as well as placement of a Wound VAC in the OR by Dr. Celine Ahr. During the hospital course the patient was placed on a Wound VAC and recommended follow up with surgery in three weeks actually with Dr. Delaine Lame who is infectious disease. The patient was on vancomycin during the hospital course. 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 He also had a bone culture which revealed evidence of methicillin-resistant staph aureus. He is updated CT scan 07/09/18 reveals that he had progression of the which was performed on wound to breakdown down to the trochanter where he actually had irregularities there as well suggestive of osteomyelitis. This was a change just since 9 December when we last performed a CT scan. Obviously this one had gone downhill quite significantly and rapidly. At this point upon evaluation I feel like in general the patient's wound seems to be doing fairly well all things considered upon my evaluation today. Obviously this is larger and deeper than what I previously evaluated but at the same time he seems to be making some progress as far as the appearance of the granulation tissue is concerned. I'm happy in that regard. No fevers, chills,  nausea, or vomiting noted at this time. He is on IV vancomycin and Rocephin at the facility. He is currently in [facility]. 08/03/18 upon evaluation today patient's wound appears to be doing better in regard to the overall appearance at this point in time. Fortunately he's been tolerating the Wound VAC without complication and states that  the facility has been taking excellent care of the wound site. Overall I see some Slough noted on the surface which I am going to attempt sharp debridement today of but nonetheless other than this I feel like he's making progress. 08/09/18 on evaluation today patient's wound appears to be doing much better compared to even last week's evaluation. Do believe that the Wound VAC is been of great benefit for him. He has been tolerating the dressing changes that is the Wound ROONEY, SWAILS. ([id number]) VAC without any complication and he has excellent granulation noted currently. There is no need for sharp debridement at this point. 08/16/18 on evaluation today patient actually appears to be doing very well in regard to the wound in the right gluteal fold region. This is showing signs of progress and again appears to be very healthy which is excellent news. Fortunately there is no sign of active infection by way of odor or drainage at this point. Overall I'm very pleased with how things stand. He seems to be tolerating the Wound VAC without complication. 08/23/18 on evaluation today patient actually appears to be doing better in regard to his wound. He has been tolerating the Wound VAC without complication and in fact it has been collecting a significant amount of drainage which I think is good news especially considering how the wound appears. Fortunately there is no signs of infection at this time definitely nothing appears to be worse which is good news. He has not been started on the Bactrim and Flagyl that was recommended by Dr. Delaine Lame yet. I did actually contact her office this morning in order to check and see were things are that regard their gonna be calling me back. 08/30/18 on evaluation today patient actually appears to show signs of excellent improvement today compared to last evaluation. The undermining is getting much better the wound seems to be feeling quite nicely and I'm very  pleased that the granulation in general. With that being said overall I feel like the patient has made excellent progress which is great news. No fevers, chills, nausea, or vomiting noted at this time. 09/06/18 on evaluation today patient actually appears to be doing rather well in regard to his right gluteal ulcer. This is showing signs of improvement in overall I'm very pleased with how things seem to be progressing. The patient likewise is please. Overall I see no evidence of infection he is about to complete his oral antibiotic regimen which is the end of the antibiotics for him in just about three days. 09/13/18 on evaluation today patient's right Ischial ulcer appears to be showing signs of continued improvement which is excellent news. He's been tolerating the dressing changes without complication. Fortunately there's no signs of infection and the wound that seems to be doing very well. 09/28/18 on evaluation today patient appears to be doing rather well in regard to his right Ischial ulcer. He's been tolerating the Wound VAC without complication he knows there's much less drainage than there used to be this obviously is not a bad thing in my pinion. There's no evidence of infection despite the fact is but nothing about it now for  several weeks. 10/04/18 on evaluation today patient appears to be doing better in regard to his right Ischial wound. He has been tolerating the Wound VAC without complication and I do believe that the silver nitrate last week was beneficial for him. Fortunately overall there's no evidence of active infection at this time which is great news. No fevers, chills, nausea, or vomiting noted at this time. 10/11/18 on evaluation today patient actually appears to be doing rather well in regard to his Ischial ulcer. He's been tolerating the Wound VAC still without complication I feel like this is doing a good job. No fevers, chills, nausea, or vomiting noted at  this time. 11/01/18 on evaluation today patient presents after having not been seen in our clinic for several weeks secondary to the fact that he was on evaluation today patient presents after having not been seen in our clinic for several weeks secondary to the fact that he was in a skilled nursing facility which was on lockdown currently due to the covert 19 national emergency. Subsequently he was discharged from the facility on this past Friday and subsequently made an appointment to come in to see yesterday. Fortunately there's no signs of active infection at this time which is good news and overall he does seem to have made progress since I last saw. Overall I feel like things are progressing quite nicely. The patient is having no pain. 11/08/18 on evaluation today patient appears to be doing okay in regard to his right gluteal ulcer. He has been utilizing a Wound VAC home health this changing this at this point since he's home from the skilled nursing facility. Fortunately there's no signs of obvious active infection at this time. Unfortunately though there's no obvious active infection he is having some maceration and his wife states that when the sheets of the Wound VAC office on Sunday when it broke seal that he ended up having significant issues with some smell as well there concerned about the possibility of infection. Fortunately there's No fevers, chills, nausea, or vomiting noted at this time. 11/15/18 on evaluation today patient actually appears to be doing well in regard to his right gluteal ulcer. He has been tolerating the dressing changes without complication. Specifically the Wound VAC has been utilized up to this point. Fortunately there's no signs of infection and overall I feel like he has made progress even since last week when I last saw him. I'm actually fairly happy with the overall appearance although he does seem to have somewhat of a hyper granular Bazzano, Wendell J.  ([id number]) overgrowth in the central portion of the wound which I think may require some sharp debridement to try flatness out possibly utilizing chemical cauterization following. 11/23/18 on evaluation today patient actually appears to be doing very well in regard to his sacral ulcer. He seems to be showing signs of improvement with good granulation. With that being said he still has the small area of hyper granulation right in the central portion of the wound which I'm gonna likely utilize silver nitrate on today. Subsequently he also keeps having a leak at the 6 o'clock location which is unfortunate we may be able to help out with some suggestions to try to prevent this going forward. Fortunately there's no signs of active infection at this time. 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 He's been tolerating the dressing changes without complication. Fortunately  there's no signs of active infection at this time. I've been rather pleased with how things have progressed there still some evidence of pressure getting to the area with some redness right around the immediate wound opening. Nonetheless other than this I'm not seeing any significant complications or issues the wound is somewhat hyper granular. Upon discussing with the patient and his wife today I'm not sure that the wound is being packed to the base with the foam at this point. And if it's not been packed fully that may be part of the reason why is not seen as much improvement as far as the granulation from the base out. Again we do not want pack too tightly but we need some of the firm to get to the base of the wound. I discussed this with patient and his wife today. 12/06/18 on evaluation today patient appears to be doing well in regard to his right gluteal pressure ulcer. He's been tolerating the dressing changes without complication.  Fortunately there's no signs of active infection. He still has some hyper granular tissue and I do think it would be appropriate to continue with the chemical cauterization as of today. 12/16/18 on evaluation today patient actually appears to be doing okay in regard to his right gluteal ulcer. He is been tolerating the dressing changes without complication including the Wound VAC. Overall I feel like nothing seems to be worsening I do feel like that the hyper granulation buds in the central portion of the wound have improved to some degree with the silver nitrate. We will have to see how things continue to progress. 12/20/18 on evaluation today patient actually appears to be doing much worse in my pinion even compared to last week's evaluation. Unfortunately as opposed to showing any signs of improvement the areas of hyper granular tissue in the central portion of the wound seem to be getting worse. Subsequently the wound bed itself also seems to be getting deeper even compared to last week which is both unusual as well as concerning since prior he had been shown signs of improvement. Nonetheless I think that the issue could be that he's actually having some difficulty in issues with a deeper infection. There's no external signs of infection but nonetheless I am more worried about the internal, osteomyelitis, that could be restarting. He has not been on antibiotics for some time at this point. I think that it may be a good idea to go ahead and started back on an antibiotic therapy while we wait to see what the testing shows. 12/27/18 on evaluation today patient presents for follow-up concerning his left gluteal fold wound. Fortunately he appears to be doing well today. I did review the CT scan which was negative for any signs of osteomyelitis or acute abnormality this is excellent news. Overall I feel like the surface of the wound bed appears to be doing significantly better today compared to previously  noted findings. There does not appear any signs of infection nor does he have any pain at this time. 01/03/19 on evaluation today patient actually appears to be doing quite well in regard to his ulcer. Post debridement last week he really did not have too much bleeding which is good news. Fortunately today this seems to be doing some better but we still has some of the hyper granular tissue noted in the base of the wound which is gonna require sharp debridement today as well. Overall I'm pleased with how things seem to be progressing since  we switched away from the Wound VAC I think he is making some progress. 01/10/19 on evaluation today patient appears to be doing better in regard to his right gluteal fold ulcer. He has been tolerating the dressing changes without complication. The debridement to seem to be helping with current away some of the poor hyper granular tissue bugs throughout the region of his gluteal fold wound. He's been tolerating the dressing changes otherwise without complication which is great news. No fevers, chills, nausea, or vomiting noted at this time. 01/17/19 on evaluation today patient actually appears to be doing excellent in regard to his wound. He's been tolerating the dressing changes without complication. Fortunately there is no signs of active infection at this time which is great news. No fevers, chills, nausea, or vomiting noted at this time. Electronic Signature(s) Signed: 01/19/2019 12:14:37 AM By: Conchita Paris, [place] ([id number]) Entered By: Worthy Keeler on 01/17/2019 09:12:41 ABYAN, CADMAN ([id number]) -------------------------------------------------------------------------------- Physical Exam Details Patient Name: [name] Date of Service: 01/17/2019 8:00 AM Medical Record Number: [id number] Patient Account Number: [id number] Date of Birth/Sex: [birth year] (68 y.o. M) Treating RN: Harold Barban Primary Care Provider:  [name] Other Clinician: Referring Provider: [name] Treating Provider/Extender: [name], HOYT Weeks in Treatment: 32 Constitutional Well-nourished and well-hydrated in no acute distress. Respiratory normal breathing without difficulty. Psychiatric this patient is able to make decisions and demonstrates good insight into disease process. Alert and Oriented x 3. pleasant and cooperative. Notes On evaluation today patient's wound does show signs of better granulation there did not appear to be much in the way of necrotic tissue although there was still some of the residual hyper granular bugs that I had trimmed off that were noted in the base of the wound I did have to use a jurat in order to trim some of this down. He tolerated this without any pain. In general however the wound bed actually appears to be doing significantly better which is great news. Electronic Signature(s) Signed: 01/19/2019 12:14:37 AM By: Worthy Keeler [name] Entered By: Worthy Keeler on 01/17/2019 09:13:15 JUDSON, TSAN ([id number]) -------------------------------------------------------------------------------- Physician Orders Details Patient Name: [name] Date of Service: 01/17/2019 8:00 AM Medical Record Number: [id number] Patient Account Number: [id number] Date of Birth/Sex: 1950-10-19 (68 y.o. 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 Skin Barriers/Peri-Wound Care Wound #1 Right Gluteal fold o Other: - Apply zinc oxide to peri-wound area. Primary Wound Dressing Wound #1 Right Gluteal fold o Silver Alginate o Pack wound with: - Dakin's 0.25% soaked gauze packed into the wound bed. Place alginate on top of wound. Secondary Dressing Wound #1 Right Gluteal fold o ABD pad - Tegaderm to secure Dressing Change Frequency Wound #1 Right Gluteal fold o Change dressing every day. Follow-up Appointments Wound #1 Right Gluteal fold o Return Appointment in 1 week. Off-Loading Wound #1 Right Gluteal fold o Turn  and reposition every 2 hours SIMRAN, MANNIS ([id number]) Home Health Wound #1 Right Gluteal fold o FACE TO FACE ENCOUNTER: MEDICARE and MEDICAID PATIENTS: I certify that this patient is under my care and that I had a face-to-face encounter that meets the physician face-to-face encounter requirements with this patient on this date. The encounter with the patient was in whole or in part for the following MEDICAL CONDITION: (primary reason for [place]) MEDICAL NECESSITY: I certify, that based on my findings, NURSING services are a medically necessary home health service. HOME BOUND STATUS: I certify that my clinical findings support that this patient is homebound (i.e., Due to illness or injury, pt requires aid of supportive devices such as crutches, cane, wheelchairs, walkers, the use of special transportation or the assistance of another person to leave their place of residence. There is a normal inability to leave the home and doing so requires considerable and taxing effort. Other absences are for medical reasons / religious services and are infrequent or of short duration when for other reasons). Negative Pressure Wound  Therapy Wound #1 Right Gluteal fold o Discontinue NPWT. Electronic Signature(s) Signed: 01/17/2019 4:40:23 PM By: Harold Barban Signed: 01/19/2019 12:14:37 AM By: Worthy Keeler [name] Entered By: Harold Barban on 01/17/2019 08:32:16 JERMON, CHALFANT ([id number]) -------------------------------------------------------------------------------- Problem List Details Patient Name: [name] Date of Service: 01/17/2019 8:00 AM Medical Record Number: [id number] Patient Account Number: [id number] Date of Birth/Sex: 03-21-1951 (68 y.o. M) Treating RN: Harold Barban Primary Care Provider: [name] Other Clinician: Referring Provider: [name] Treating Provider/Extender: [name], HOYT Weeks in Treatment: 13 Active Problems ICD-10 Evaluated Encounter Code Description Active Date Today Diagnosis L89.314 Pressure ulcer of right buttock, stage 4 11/30/2017 No Yes L03.317 Cellulitis of buttock 06/21/2018 No Yes G82.20 Paraplegia, unspecified 11/30/2017 No Yes S34.109S Unspecified injury to unspecified level of lumbar spinal cord, 11/30/2017 No Yes sequela I10 Essential (primary) hypertension 11/30/2017 No Yes Inactive Problems Resolved Problems Electronic Signature(s) Signed: 01/19/2019 12:14:37 AM By: Worthy Keeler [name] Entered By: Worthy Keeler on 01/17/2019 08:21:25 JOVANY, DISANO ([id number]) -------------------------------------------------------------------------------- Progress Note Details Patient Name: [name] Date of Service: 01/17/2019 8:00 AM Medical Record Number: [id number] Patient Account Number: [id number] Date of Birth/Sex: [birth year] (68 y.o. 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 He states that there is no history of diabetes that he is aware of. He does have issues with his prostate and is currently receiving treatment for this by way of oral medication. With that being said I do not have a lot of details in that regard. Nonetheless the patient presents today as a result of having been referred to [place] by another provider initially home health was set to come out and take care of his wound although due to the fact that he apparently drives he's not able to receive home health. His wife is therefore trying to help take care of this wound within although they have been struggling with what exactly to do at this point. She states that she  can do some things but she is definitely not a nurse and does have some issues with looking at blood. The good news is the wound does not appear to be too deep and is fairly superficial at this point. There is no slough noted there is some nonviable skin noted around the surface of the wound and the perimeter at this point. The central portion of the wound appears to be very good with a dermal layer noted this does not appear to be again deep enough to extend it to subcutaneous tissue at this point. 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 In the pump he has Dilaudid and baclofen. 12/07/17 on evaluation today patient presents for follow-up concerning his ongoing lower back thigh ulcer on the right. He states that he did not get the supplies ordered and therefore has not really been able to perform the dressing changes as directed exactly. His wife  was able to get some Boarder Foam Dressing's from the drugstore and subsequently has been using hydrogel which did help to a degree in the wound does appear to be able smaller. There is actually more drainage this week noted than previous. 12/21/17 on evaluation today patient appears to be doing rather well in regard to his right gluteal ulcer. He has been tolerating the dressing changes without complication. There does not appear to be any evidence of infection at this point in time. Overall the wound does seem to be making some progress as far as the edges are concerned there's not as much in the way of overlapping of the external wound edges and he has a good epithelium to wound bed border for the most part. This however is not true right at the 12 o'clock location over the span of a little over a centimeters which actually will require debridement today to clean this away and hopefully allow it to continue to heal more appropriately. 12/28/17 on evaluation today patient appears to be doing rather well in regard to his ulcer in the left gluteal region. He's been tolerating the dressing changes without complication. Apparently he has had some difficulty getting his dressing material. Apparently there's been some confusion with ordering we're gonna check into this. Nonetheless overall he's been showing signs of improvement which is good news. Debridement is not required today. 01/04/18 on evaluation today patient presents for follow-up concerning his right gluteal ulcer. He has been tolerating the dressing changes fairly well. On inspection today it appears he may actually have some maceration them concerned about the fact that he may be developing too much moisture in and around the wound bed which can cause delay in healing. With that being said he unfortunately really has not showed significant signs of improvement since last week's evaluation in fact this may even be just the little bit/slightly  larger. Nonetheless he's been having a lot of discomfort I'm not sure this is even related to the wound as he has no pain when I'm to breeding or otherwise cleaning the wound during evaluation today. Nonetheless this is something that we did recommend he talked to his pain specialist concerning. 01/11/18 on evaluation today patient appears to be doing better in regard to his ulceration. He has been tolerating the dressing Stenzel, Delsin J. ([id number]) changes without complication. With that being said overall there's no evidence of infection which is good news. The only thing is he did  receive the hatch affair blue classic versus the ready nonetheless I feel like this is perfectly fine and appears to have done well for him over the past week. 01/25/18 on evaluation today patient's wound actually appears to be a little bit larger than during the last evaluation. The good news is the majority of the wound edges actually appear to be fairly firmly attached to the wound bed unfortunately again we're not really making progress in regard to the size. Roughly the wound is about the same size as when I first saw him although again the wound margin/edges appear to be much better. 02/01/18 on evaluation today patient actually appears to be doing very well in regard to his wound. Applying the Prisma dry does seem to be better although he does still have issues with slow progression of the wound. There was a slight improvement compared to last week's measurements today. Nonetheless I have been considering other options as far as the possibility of Theraskin or even a snap vac. In general I'm not sure that the Theraskin due to location of the wound would be a very good idea. Nonetheless I do think that a snap vac could be a possibility for the patient and in fact I think this could even be an excellent way to manage the wound possibly seeing some improvement in a very rapid fashion here. Nonetheless this is  something that we would need to get approved and I did have a lengthy conversation with the patient about this today. 02/08/18 on evaluation today patient appears to be doing a little better in regard to his ulcer. He has been tolerating the dressing changes without complication. Fortunately despite the fact that the wound is a little bit smaller it's not significantly so unfortunately. We have discussed the possibility of a snap vac we did check with insurance this is actually covered at this point. Fortunately there does not appear to be any sign of infection. Overall I'm fairly pleased with how things seem to be appearing at this point. 02/15/18 on evaluation today patient appears to be doing rather well in regard to his right gluteal ulcer. Unfortunately the snap vac did not stay in place with his sheer and friction this came loose and did not seem to maintain seal very well. He worked for about two days and it did seem to do very well during that time according to his wife but in general this does not seem to be something that's gonna be beneficial for him long-term. I do believe we need to go back to standard dressings to see if we can find something that will be of benefit. 03/02/18- He is here in follow up evaluation; there is minimal change in the wound. He will continue with the same treatment plan, would consider changing to iodosrob/iodoflex if ulcer continues to to plateau. He will follow up next week 03/08/18 on evaluation today patient's wound actually appears to be about the same size as when I previously saw him several weeks back. Unfortunately he does have some slightly dark discoloration in the central portion of the wound which has me concerned about pressure injury. I do believe he may be sitting for too long a period of time in fact he tells me that "I probably sit for much too long". He does have some Slough noted on the surface of the wound and again as far as the size of  the wound is concerned I'm really not seeing anything that seems to have improved  significantly. 03/15/18 on evaluation today patient appears to be doing fairly well in regard to his ulcer. The wound measured pretty much about the same today compared to last week's evaluation when looking at his graph. With that being said the area of bruising/deep tissue injury that was noted last week I do not see at this point. He did get a new cushion fortunately this does seem to be have been of benefit in my pinion. It does appear that he's been off of this more which is good news as well I think that is definitely showing in the overall wound measurements. With that being said I do believe that he needs to continue to offload I don't think that the fact this is doing better should be or is going to allow him to not have to offload and explain this to him as well. Overall he seems to be in agreement the plan I think he understands. The overall appearance of the wound bed is improved compared to last week I think the Iodoflex has been beneficial in that regard. 03/29/18 on evaluation today patient actually appears to be doing rather well in regard to his wound from the overall appearance standpoint he does have some granulation although there's some Slough on the surface of the wound noted as well. With that being said he unfortunately has not improved in regard to the overall measurement of the wound in volume or in size. I did have a discussion with him very specifically about offloading today. He actually does work although he mainly is just sitting throughout the day. He tells me he offloads by "lifting himself up for 30 seconds off of his chair occasionally" purchase from [employer] which does seem to have helped. And he has a new cushion that he with that being said he's also able to stand some for a very short period of time but not significant enough I think to provide appropriate offloading. I think  the biggest issue at this point with the wound and the fact is not healing as quickly as we would like is due to the fact that he is really not able to appropriately offload while at work. He states the beginning after his injury he actually had a bed at his job that he could lay on in order to offload and that does seem to have been of help back at that time. Nonetheless he had not done this in quite some time unfortunately. I think that could be helpful for him this is something I would like for him to look into. LAKYN, MANTIONE ([id number]) 04/05/18 on evaluation today patient actually presents for follow-up concerning his right gluteal ulcer. Again he really is not significantly improved even compared to last week. He has been tolerating the dressing changes without complication. With that being said fortunately there appears to be no evidence of infection at this time. He has been more proactive in trying to offload. 04/12/18 on evaluation today patient actually appears to be doing a little better in regard to his wound and the right gluteal fold region. He's been tolerating the dressing changes since removing the oasis without complication. However he was having a lot of burning initially with the oasis in place. He's unsure of exactly why this was given so much discomfort but he assumes that it was the oasis itself causing the problem. Nonetheless this had to be removed after about three days in place although even those three days seem to have made  a fairly good improvement in regard to the overall appearance of the wound bed. In fact is the first time that he's made any improvement from the standpoint of measurements in about six weeks. He continues to have no discomfort over the area of the wound itself which leads me to wonder why he was having the burning with the oasis when he does not even feel the actual debridement's themselves. I am somewhat perplexed by this. 04/19/18 on  evaluation today patient's wound actually appears to be showing signs of epithelialization around the edge of the wound and in general actually appears to be doing better which is good news. He did have the same burning after about three days with applying the Endoform last week in the same fashion that I would generally apply a skin substitute. This seems to indicate that it's not the oasis to cause the problem but potentially the moisture buildup that just causes things to burn or there may be some other reaction with the skin prep or Steri-Strips. Nonetheless I'm not sure that is gonna be able to tolerate any skin substitute for a long period of time. The good news is the wound actually appears to be doing better today compared to last week and does seem to finally be making some progress. 04/26/18 on evaluation today patient actually appears to be doing rather well in regard to his ulcer in the right gluteal fold. He has been tolerating the dressing changes without complication which is good news. The Endoform does seem to be helping although he was a little bit more macerated this week. This seems to be an ongoing issue with fluid control at this point. Nonetheless I think we may be able to add something like Drawtex to help control the drainage. 05/03/18 on evaluation today patient appears to actually be doing better in regard to the overall appearance of his wound. He has been tolerating the dressing changes without complication. Fortunately there appears to be no evidence of infection at this time. I really feel like his wound has shown signs as of today of turning around last week I thought so as well and definitely he could be seen in this week's overall appearance and measurements. In general I'm very pleased with the fact that he finally seems to be making a steady but sure progress. The patient likewise is very pleased. 05/17/18 on evaluation today patient appears to be doing more poorly  unfortunately in regard to his ulcer. He has been tolerating the dressing changes without complication. With that being said he tells me that in the past couple of days he and his wife have noticed that we did not seem to be doing quite as well is getting dark near the center. Subsequently upon evaluation today the wound actually does appear to be doing worse compared to previous. He has been tolerating the dressing changes otherwise and he states that he is not been sitting up anymore than he was in the past from what he tells me. Still he has continued to work he states "I'm tired of dealing with this and if I have to just go home and lay in the bed all the time that's what I'll do". Nonetheless I am concerned about the fact that this wound does appear to be deeper than what it was previous. 05/24/18 upon evaluation today patient actually presents after having been in the hospital due to what was presumed to be sepsis secondary to the wound infection. He had an elevated  white blood cell count between 14 and 15. With that being said he does seem to be doing somewhat better now. His wound still is giving him some trouble nonetheless and he is obviously concerned about the fact likely talked about that this does seem to go more deeply than previously noted. I did review his wound culture which showed evidence of Staphylococcus aureus him and group B strep. Nonetheless he is on antibiotics, Levaquin, for this. Subsequently I did review his intake summary from the hospital as well. I also did look at the CT of the lumbar spine with contrast that was performed which showed no bone destruction to suggest lumbar disguises/osteomyelitis or sacral osteomyelitis. There was no paraspinal abscess. Nonetheless it appears this may have been more of just a soft tissue infection at this point which is good news. He still is nonetheless concerned about the wound which again I think is completely reasonable  considering everything he's been through recently. 05/31/18 on evaluation today on evaluation today patient actually appears to be showing signs of his wound be a little bit deeper than what I would like to see. Fortunately he does not show any signs of significant infection although his temperature was 99 today he states he's been checking this at home and has not been elevated. Nonetheless with the undermining that I'm seeing at this point I am becoming more concerned about the wound I do think that offloading is a key factor here that is preventing the speedy recovery at this point. There does not appear to be any evidence of again over infection noted. He's been using Santyl currently. GEMINI, BEAUMIER ([id number]) 06/07/18 the patient presents today for follow-up evaluation regarding the left ulcer in the gluteal region. He has been tolerating the Wound VAC fairly well. He is obviously very frustrated with this he states that to mean is really getting in his way. There does not appear to be any evidence of infection at this time he does have a little bit of odor I do not necessarily associate this with infection just something that we sometimes notice with Wound VAC therapy. With that being said I can definitely catch a tone of discontentment overall in the patient's demeanor today. This when he was previously in the hospital an CT scan was done of the lumbar region which did not reveal any signs of osteomyelitis. With that being said the pelvis in particular was not evaluated distinctly which means he could still have some osteonecrosis I. Nonetheless the Wound VAC was started on Thursday I do want to get this little bit more time before jumping to a CT scan of the pelvis although that is something that I might would recommend if were not see an improvement by that time. 06/14/18 on evaluation today patient actually appears to be doing about the same in regard to his right gluteal ulcer.  Again he did have a CT scan of the lumbar spine unfortunately this did not include the pelvis. Nonetheless with the depth of the wound that I'm seeing today even despite the fact that I'm not seeing any evidence of overt cellulitis I believe there's a good chance that we may be dealing with osteomyelitis somewhere in the right Ischial region. No fevers, chills, nausea, or vomiting noted at this time. 06/21/18 on evaluation today patient actually appears to be doing about the same with regard to his wound. The tunnel at 6 o'clock really does not appear to be any deeper although it is a  little bit wider. I think at this point you may want to start packing this with white phone. Unfortunately I have not got approval for the CT scan of the pelvis as of yet due to the fact that Medicare apparently has been denied it due to the diagnosis codes not being appropriate according to Medicare for the test requested. With that being said the patient cannot have an MRI and therefore this is the only option that we have as far as testing is concerned. The patient has had infection and was on antibiotics and been added code for cellulitis of the bottom to see if this will be appropriate for getting the test approved. Nonetheless I'm concerned about the infection have been spread deeper into the Ischial region. 06/28/18 on evaluation today patient actually appears to be doing rather well all things considered in regard to the right gluteal ulcer. He has been tolerating the dressing changes without complication. With that being said the Wound VAC he states does have to be replaced almost every day or at least reinforced unfortunately. Patient actually has his CT scan later this morning we should have the results by tomorrow. 07/05/18 on evaluation today patient presents for follow-up concerning his right Ischial ulcer. He did see the surgeon Dr. Lysle Pearl last week. They were actually very happy with him and felt like he  spent a tremendous amount of time with them as far as discussing his situation was concerned. In the end Dr. Lysle Pearl did contact me as well and determine that he would not recommend any surgical intervention at this point as he felt like it would not be in the patient's best interest based on what he was seeing. He recommended a referral to infectious disease. Subsequently this is something that Dr. Ines Bloomer office is working on setting up for the patient. As far as evaluation today is concerned the patient's wound actually appears to be worse at this point. I am concerned about how things are progressing and specifically about infection. I do not feel like it's the deeper but the area of depth is definitely widening which does have me concerned. No fevers, chills, nausea, or vomiting noted at this time. I think that we do need initiate antibiotic therapy the patient has an allow allergy to amoxicillin/penicillin he states that he gets a rash since childhood. Nonetheless she's never had the issues with Catholics or cephalosporins in general but he is aware of. 07/27/18 on evaluation today patient presents following admission to the hospital on 07/09/18. He was subsequently discharged on 07/20/18. On 07/15/18 the patient underwent irrigation and debridement was soft tissue biopsy and bone biopsy as well as placement of a Wound VAC in the OR by Dr. Celine Ahr. During the hospital course the patient was placed on a Wound VAC and recommended follow up with surgery in three weeks actually with Dr. Delaine Lame who is infectious disease. The patient was on vancomycin during the hospital course. 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 He also had a bone culture which revealed evidence of methicillin-resistant staph aureus. He is updated CT scan 07/09/18 reveals that he had progression of the which was performed on wound to breakdown down to the trochanter where he actually had  irregularities there as well suggestive of osteomyelitis. This was a change just since 9 December when we last performed a CT scan. Obviously this one had gone downhill quite significantly and rapidly. At this point upon evaluation I feel like in general  the patient's wound seems to be doing fairly well all things considered upon my evaluation today. Obviously this is larger and deeper than what I previously evaluated but at the same time he seems to be making some progress as far as the appearance of the granulation tissue is concerned. I'm happy in that regard. No fevers, chills, nausea, or vomiting noted at this time. He is on IV vancomycin and Rocephin at the facility. He is currently in [facility]. 08/03/18 upon evaluation today patient's wound appears to be doing better in regard to the overall appearance at this point in time. Fortunately he's been tolerating the Wound VAC without complication and states that the facility has been taking RAMONA, SLINGER. ([id number]) excellent care of the wound site. Overall I see some Slough noted on the surface which I am going to attempt sharp debridement today of but nonetheless other than this I feel like he's making progress. 08/09/18 on evaluation today patient's wound appears to be doing much better compared to even last week's evaluation. Do believe that the Wound VAC is been of great benefit for him. He has been tolerating the dressing changes that is the Wound VAC without any complication and he has excellent granulation noted currently. There is no need for sharp debridement at this point. 08/16/18 on evaluation today patient actually appears to be doing very well in regard to the wound in the right gluteal fold region. This is showing signs of progress and again appears to be very healthy which is excellent news. Fortunately there is no sign of active infection by way of odor or drainage at this point. Overall I'm very pleased with how  things stand. He seems to be tolerating the Wound VAC without complication. 08/23/18 on evaluation today patient actually appears to be doing better in regard to his wound. He has been tolerating the Wound VAC without complication and in fact it has been collecting a significant amount of drainage which I think is good news especially considering how the wound appears. Fortunately there is no signs of infection at this time definitely nothing appears to be worse which is good news. He has not been started on the Bactrim and Flagyl that was recommended by Dr. Delaine Lame yet. I did actually contact her office this morning in order to check and see were things are that regard their gonna be calling me back. 08/30/18 on evaluation today patient actually appears to show signs of excellent improvement today compared to last evaluation. The undermining is getting much better the wound seems to be feeling quite nicely and I'm very pleased that the granulation in general. With that being said overall I feel like the patient has made excellent progress which is great news. No fevers, chills, nausea, or vomiting noted at this time. 09/06/18 on evaluation today patient actually appears to be doing rather well in regard to his right gluteal ulcer. This is showing signs of improvement in overall I'm very pleased with how things seem to be progressing. The patient likewise is please. Overall I see no evidence of infection he is about to complete his oral antibiotic regimen which is the end of the antibiotics for him in just about three days. 09/13/18 on evaluation today patient's right Ischial ulcer appears to be showing signs of continued improvement which is excellent news. He's been tolerating the dressing changes without complication. Fortunately there's no signs of infection and the wound that seems to be doing very well. 09/28/18 on evaluation today patient  appears to be doing rather well in regard to his right  Ischial ulcer. He's been tolerating the Wound VAC without complication he knows there's much less drainage than there used to be this obviously is not a bad thing in my pinion. There's no evidence of infection despite the fact is but nothing about it now for several weeks. 10/04/18 on evaluation today patient appears to be doing better in regard to his right Ischial wound. He has been tolerating the Wound VAC without complication and I do believe that the silver nitrate last week was beneficial for him. Fortunately overall there's no evidence of active infection at this time which is great news. No fevers, chills, nausea, or vomiting noted at this time. 10/11/18 on evaluation today patient actually appears to be doing rather well in regard to his Ischial ulcer. He's been tolerating the Wound VAC still without complication I feel like this is doing a good job. No fevers, chills, nausea, or vomiting noted at this time. 11/01/18 on evaluation today patient presents after having not been seen in our clinic for several weeks secondary to the fact that he was on evaluation today patient presents after having not been seen in our clinic for several weeks secondary to the fact that he was in a skilled nursing facility which was on lockdown currently due to the covert 19 national emergency. Subsequently he was discharged from the facility on this past Friday and subsequently made an appointment to come in to see yesterday. Fortunately there's no signs of active infection at this time which is good news and overall he does seem to have made progress since I last saw. Overall I feel like things are progressing quite nicely. The patient is having no pain. 11/08/18 on evaluation today patient appears to be doing okay in regard to his right gluteal ulcer. He has been utilizing a Wound VAC home health this changing this at this point since he's home from the skilled nursing facility. Fortunately there's no signs of  obvious active infection at this time. Unfortunately though there's no obvious active infection he is having some maceration and his wife states that when the sheets of the Wound VAC office on Sunday when it broke seal that he ended up having significant issues with some smell as well there concerned about the possibility of infection. Fortunately there's No fevers, chills, nausea, or vomiting noted at this time. JOANTHONY, HAMZA ([id number]) 11/15/18 on evaluation today patient actually appears to be doing well in regard to his right gluteal ulcer. He has been tolerating the dressing changes without complication. Specifically the Wound VAC has been utilized up to this point. Fortunately there's no signs of infection and overall I feel like he has made progress even since last week when I last saw him. I'm actually fairly happy with the overall appearance although he does seem to have somewhat of a hyper granular overgrowth in the central portion of the wound which I think may require some sharp debridement to try flatness out possibly utilizing chemical cauterization following. 11/23/18 on evaluation today patient actually appears to be doing very well in regard to his sacral ulcer. He seems to be showing signs of improvement with good granulation. With that being said he still has the small area of hyper granulation right in the central portion of the wound which I'm gonna likely utilize silver nitrate on today. Subsequently he also keeps having a leak at the 6 o'clock location which is unfortunate we may be  able to help out with some suggestions to try to prevent this going forward. Fortunately there's no signs of active infection at this time. 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 He's been tolerating the dressing changes without complication. Fortunately there's no signs of active infection  at this time. I've been rather pleased with how things have progressed there still some evidence of pressure getting to the area with some redness right around the immediate wound opening. Nonetheless other than this I'm not seeing any significant complications or issues the wound is somewhat hyper granular. Upon discussing with the patient and his wife today I'm not sure that the wound is being packed to the base with the foam at this point. And if it's not been packed fully that may be part of the reason why is not seen as much improvement as far as the granulation from the base out. Again we do not want pack too tightly but we need some of the firm to get to the base of the wound. I discussed this with patient and his wife today. 12/06/18 on evaluation today patient appears to be doing well in regard to his right gluteal pressure ulcer. He's been tolerating the dressing changes without complication. Fortunately there's no signs of active infection. He still has some hyper granular tissue and I do think it would be appropriate to continue with the chemical cauterization as of today. 12/16/18 on evaluation today patient actually appears to be doing okay in regard to his right gluteal ulcer. He is been tolerating the dressing changes without complication including the Wound VAC. Overall I feel like nothing seems to be worsening I do feel like that the hyper granulation buds in the central portion of the wound have improved to some degree with the silver nitrate. We will have to see how things continue to progress. 12/20/18 on evaluation today patient actually appears to be doing much worse in my pinion even compared to last week's evaluation. Unfortunately as opposed to showing any signs of improvement the areas of hyper granular tissue in the central portion of the wound seem to be getting worse. Subsequently the wound bed itself also seems to be getting deeper even compared to last week which is both  unusual as well as concerning since prior he had been shown signs of improvement. Nonetheless I think that the issue could be that he's actually having some difficulty in issues with a deeper infection. There's no external signs of infection but nonetheless I am more worried about the internal, osteomyelitis, that could be restarting. He has not been on antibiotics for some time at this point. I think that it may be a good idea to go ahead and started back on an antibiotic therapy while we wait to see what the testing shows. 12/27/18 on evaluation today patient presents for follow-up concerning his left gluteal fold wound. Fortunately he appears to be doing well today. I did review the CT scan which was negative for any signs of osteomyelitis or acute abnormality this is excellent news. Overall I feel like the surface of the wound bed appears to be doing significantly better today compared to previously noted findings. There does not appear any signs of infection nor does he have any pain at this time. 01/03/19 on evaluation today patient actually appears to be doing quite well in regard to his ulcer. Post debridement last week he  really did not have too much bleeding which is good news. Fortunately today this seems to be doing some better but we still has some of the hyper granular tissue noted in the base of the wound which is gonna require sharp debridement today as well. Overall I'm pleased with how things seem to be progressing since we switched away from the Wound VAC I think he is making some progress. 01/10/19 on evaluation today patient appears to be doing better in regard to his right gluteal fold ulcer. He has been tolerating the dressing changes without complication. The debridement to seem to be helping with current away some of the poor hyper granular tissue bugs throughout the region of his gluteal fold wound. He's been tolerating the dressing changes otherwise without complication which  is great news. No fevers, chills, nausea, or vomiting noted at this time. 01/17/19 on evaluation today patient actually appears to be doing excellent in regard to his wound. He's been tolerating the dressing changes without complication. Fortunately there is no signs of active infection at this time which is great news. No fevers, chills, nausea, or vomiting noted at this time. JERONE, CUDMORE ([id number]) Patient History Information obtained from Patient. Family History Hypertension - Father, Stroke - Mother, No family history of Cancer, Diabetes, Heart Disease, Kidney Disease, Lung Disease, Seizures, Thyroid Problems, Tuberculosis. Social History Never smoker, Marital Status - Married, Alcohol Use - Never, Drug Use - No History, Caffeine Use - Daily. Medical History Eyes Patient has history of Cataracts - both removed Denies history of Glaucoma, Optic Neuritis Ear/Nose/Mouth/Throat Denies history of Chronic sinus problems/congestion, Middle ear problems Hematologic/Lymphatic Denies history of Anemia, Hemophilia, Human Immunodeficiency Virus, Lymphedema Respiratory Denies history of Aspiration, Asthma, Chronic Obstructive Pulmonary Disease (COPD), Pneumothorax, Sleep Apnea, Tuberculosis Cardiovascular Patient has history of Hypertension - takes medication Denies history of Angina, Arrhythmia, Congestive Heart Failure, Coronary Artery Disease, Deep Vein Thrombosis, Hypotension, Myocardial Infarction, Peripheral Arterial Disease, Peripheral Venous Disease, Phlebitis, Vasculitis Gastrointestinal Denies history of Cirrhosis , Colitis, Crohn s, Hepatitis A, Hepatitis B, Hepatitis C Endocrine Denies history of Type I Diabetes, Type II Diabetes Genitourinary Denies history of End Stage Renal Disease Immunological Denies history of Lupus Erythematosus, Raynaud s, Scleroderma Integumentary (Skin) Denies history of History of Burn, History of pressure wounds Musculoskeletal Denies  history of Gout, Rheumatoid Arthritis, Osteoarthritis, Osteomyelitis Neurologic Patient has history of Paraplegia - waist down Denies history of Dementia, Neuropathy, Quadriplegia, Seizure Disorder Oncologic Denies history of Received Chemotherapy, Received Radiation Psychiatric Denies history of Anorexia/bulimia, Confinement Anxiety Medical And Surgical History Notes Oncologic Prostate cancer- currently treated with horomone therapy Review of Systems (ROS) Constitutional Symptoms (General Health) Denies complaints or symptoms of Fatigue, Fever, Chills, Marked Weight Change. Respiratory Denies complaints or symptoms of Chronic or frequent coughs, Shortness of Breath. Cardiovascular Denies complaints or symptoms of Chest pain, LE edema. Psychiatric CAEDAN, SUMLER ([id number]) Denies complaints or symptoms of Anxiety, Claustrophobia. Objective Constitutional Well-nourished and well-hydrated in no acute distress. Vitals Time Taken: 8:07 AM, Height: 73 in, Weight: 210 lbs, BMI: 27.7, Temperature: 98.5 F, Pulse: 78 bpm, Respiratory Rate: 16 breaths/min, Blood Pressure: 139/79 mmHg. Respiratory normal breathing without difficulty. Psychiatric this patient is able to make decisions and demonstrates good insight into disease process. Alert and Oriented x 3. pleasant and cooperative. General Notes: On evaluation today patient's wound does show signs of better granulation there did not appear to be much in the way of necrotic tissue although there was still some of the residual hyper  granular bugs that I had trimmed off that were noted in the base of the wound I did have to use a jurat in order to trim some of this down. He tolerated this without any pain. In general however the wound bed actually appears to be doing significantly better which is great news. Integumentary (Hair, Skin) Wound #1 status is Open. Original cause of wound was Pressure Injury. The wound is located on the  Right Gluteal fold. The wound measures 2.7cm length x 2.7cm width x 2.5cm depth; 5.726cm^2 area and 14.314cm^3 volume. There is muscle and Fat Layer (Subcutaneous Tissue) Exposed exposed. There is undermining starting at 10:00 and ending at 2:00 with a maximum distance of 4.5cm. There is a large amount of serous drainage noted. The wound margin is epibole. There is large (67-100%) red, pink, hyper - granulation within the wound bed. There is a small (1-33%) amount of necrotic tissue within the wound bed including Adherent Slough. 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 ([id number]) Procedures Wound #1 Pre-procedure diagnosis of Wound #1 is a Pressure Ulcer located on the Right Gluteal fold . There was a Excisional Skin/Subcutaneous Tissue Debridement with a total area of 7.29 sq cm performed by [name], HOYT E., [name]. With the following instrument(s): Curette to remove Viable and Non-Viable tissue/material. Material removed includes Subcutaneous Tissue and Slough and after achieving pain control using Lidocaine. No specimens were taken. A time out was conducted at 08:26, prior to the start of the procedure. A Moderate amount of bleeding was controlled with Pressure. The procedure was tolerated well with a pain level of 0 throughout and a pain level of 0 following the procedure. Post Debridement Measurements: 2.7cm length x 2.7cm width x 2.5cm depth; 14.314cm^3 volume. Post debridement Stage noted as Category/Stage IV. Character of Wound/Ulcer Post Debridement is improved. Post procedure Diagnosis Wound #1: Same as Pre-Procedure Plan Wound Cleansing: Wound #1 Right Gluteal fold: Cleanse wound with mild soap and water Anesthetic (add to Medication List): Wound #1 Right Gluteal  fold: Topical Lidocaine 4% cream applied to wound bed prior to debridement (In Clinic Only). Skin Barriers/Peri-Wound Care: Wound #1 Right Gluteal fold: Other: - Apply zinc oxide to peri-wound area. Primary Wound Dressing: Wound #1 Right Gluteal fold: Silver Alginate Pack wound with: - Dakin's 0.25% soaked gauze packed into the wound bed. Place alginate on top of wound. Secondary Dressing: Wound #1 Right Gluteal fold: ABD pad - Tegaderm to secure Dressing Change Frequency: Wound #1 Right Gluteal fold: Change dressing every day. Follow-up Appointments: Wound #1 Right Gluteal fold: Return Appointment in 1 week. Off-Loading: Wound #1 Right Gluteal fold: Turn and reposition every 2 hours Home Health: Wound #1 Right Gluteal fold: FACE TO FACE ENCOUNTER: MEDICARE and MEDICAID PATIENTS: I certify that this patient is under my care and that I had a face-to-face encounter that meets the physician face-to-face encounter requirements with this patient on this date. The encounter with the patient was in whole or in part for the following MEDICAL CONDITION: (primary reason for Porter) MEDICAL NECESSITY: I certify, that based on my findings, NURSING services are a medically necessary home health service. HOME BOUND STATUS: I certify that my clinical findings support that this patient is homebound (i.e., Due to illness or injury, pt requires aid of supportive devices such as crutches, cane, wheelchairs, walkers, the use of special  transportation or the assistance of another person to leave their place of residence. There is a normal inability to leave the home and doing so requires considerable and taxing effort. Other absences are for medical reasons / religious services and are infrequent or of short duration when for other reasons). Negative Pressure Wound Therapy: SIERRA, BISSONETTE. ([id number]) Wound #1 Right Gluteal fold: Discontinue NPWT. My suggestion currently is gonna be that  we continue with the above wound to measures for the next week and the patient is in agreement the plan. We will subsequently see were things stand at follow-up. If anything changes worsens the meantime he will contact the office and let me know. Please see above for specific wound care orders. We will see patient for re-evaluation in 1 week(s) here in the clinic. If anything worsens or changes patient will contact our office for additional recommendations. Electronic Signature(s) Signed: 01/19/2019 12:14:37 AM By: Worthy Keeler [name] Entered By: Worthy Keeler on 01/17/2019 09:13:40 JERELL, DEMERY ([id number]) -------------------------------------------------------------------------------- ROS/PFSH Details Patient Name: [name] Date of Service: 01/17/2019 8:00 AM Medical Record Number: [id number] Patient Account Number: [id number] Date of Birth/Sex: [birth year] (68 y.o. M) Treating RN: Harold Barban Primary Care Provider: [name] Other Clinician: Referring Provider: [name] Treating Provider/Extender: [name], HOYT Weeks in Treatment: 64 Information Obtained From Patient Constitutional Symptoms (General Health) Complaints and Symptoms: Negative for: Fatigue; Fever; Chills; Marked Weight Change Respiratory Complaints and Symptoms: Negative for: Chronic or frequent coughs; Shortness of Breath Medical History: Negative for: Aspiration; Asthma; Chronic Obstructive Pulmonary Disease (COPD); Pneumothorax; Sleep Apnea; Tuberculosis Cardiovascular Complaints and Symptoms: Negative for: Chest pain; LE edema Medical History: Positive for: Hypertension - takes medication Negative for: Angina; Arrhythmia; Congestive Heart Failure; Coronary Artery Disease; Deep Vein Thrombosis; Hypotension; Myocardial Infarction; Peripheral Arterial Disease; Peripheral Venous Disease; Phlebitis; Vasculitis Psychiatric Complaints and Symptoms: Negative for: Anxiety;  Claustrophobia Medical History: Negative for: Anorexia/bulimia; Confinement Anxiety Eyes Medical History: Positive for: Cataracts - both removed Negative for: Glaucoma; Optic Neuritis Ear/Nose/Mouth/Throat Medical History: Negative for: Chronic sinus problems/congestion; Middle ear problems Hematologic/Lymphatic Medical History: Negative for: Anemia; Hemophilia; Human Immunodeficiency Virus; Lymphedema JAYCOB, MCCLENTON. ([id number]) Gastrointestinal Medical History: Negative for: Cirrhosis ; Colitis; Crohnos; Hepatitis A; Hepatitis B; Hepatitis C Endocrine Medical History: Negative for: Type I Diabetes; Type II Diabetes Genitourinary Medical History: Negative for: End Stage Renal Disease Immunological Medical History: Negative for: Lupus Erythematosus; Raynaudos; Scleroderma Integumentary (Skin) Medical History: Negative for: History of Burn; History of pressure wounds Musculoskeletal Medical History: Negative for: Gout; Rheumatoid Arthritis; Osteoarthritis; Osteomyelitis Neurologic Medical History: Positive for: Paraplegia - waist down Negative for: Dementia; Neuropathy; Quadriplegia; Seizure Disorder Oncologic Medical History: Negative for: Received Chemotherapy; Received Radiation Past Medical History Notes: Prostate cancer- currently treated with horomone therapy HBO Extended History Items Eyes: Cataracts Immunizations Pneumococcal Vaccine: Received Pneumococcal Vaccination: No Implantable Devices No devices added Family and Social History Cancer: No; Diabetes: No; Heart Disease: No; Hypertension: Yes - Father; Kidney Disease: No; Lung Disease: No; Seizures: No; Stroke: Yes - Mother; Thyroid Problems: No; Tuberculosis: No; Never smoker; Marital Status - Married; Alcohol Use: Never; Drug Use: No History; Caffeine Use: Daily CHERON, PASQUARELLI ([id number]) Physician Affirmation I have reviewed and agree with the above information. Electronic  Signature(s) Signed: 01/17/2019 4:40:23 PM By: Harold Barban Signed: 01/19/2019 12:14:37 AM By: Worthy Keeler [name] Entered By: Worthy Keeler on 01/17/2019 09:12:58 EURAL, HOLZSCHUH ([id number]) -------------------------------------------------------------------------------- SuperBill Details Patient Name: [name] Date of Service: 01/17/2019 Medical  Record Number: [id number] Patient Account Number: [id number] Date of Birth/Sex: 1951/03/10 (68 y.o. 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

## 2019-01-24 ENCOUNTER — Encounter: Payer: Medicare Other | Attending: Physician Assistant | Admitting: Physician Assistant

## 2019-01-24 ENCOUNTER — Other Ambulatory Visit: Payer: Self-pay

## 2019-01-24 DIAGNOSIS — Z880 Allergy status to penicillin: Secondary | ICD-10-CM | POA: Insufficient documentation

## 2019-01-24 DIAGNOSIS — C61 Malignant neoplasm of prostate: Secondary | ICD-10-CM | POA: Diagnosis not present

## 2019-01-24 DIAGNOSIS — L03317 Cellulitis of buttock: Secondary | ICD-10-CM | POA: Insufficient documentation

## 2019-01-24 DIAGNOSIS — Z8249 Family history of ischemic heart disease and other diseases of the circulatory system: Secondary | ICD-10-CM | POA: Diagnosis not present

## 2019-01-24 DIAGNOSIS — Z79899 Other long term (current) drug therapy: Secondary | ICD-10-CM | POA: Diagnosis not present

## 2019-01-24 DIAGNOSIS — I10 Essential (primary) hypertension: Secondary | ICD-10-CM | POA: Insufficient documentation

## 2019-01-24 DIAGNOSIS — L89314 Pressure ulcer of right buttock, stage 4: Secondary | ICD-10-CM | POA: Diagnosis not present

## 2019-01-24 DIAGNOSIS — Z881 Allergy status to other antibiotic agents status: Secondary | ICD-10-CM | POA: Diagnosis not present

## 2019-01-24 DIAGNOSIS — G8220 Paraplegia, unspecified: Secondary | ICD-10-CM | POA: Insufficient documentation

## 2019-01-25 NOTE — Progress Notes (Signed)
KASTON, FAUGHN ([id number]) Visit Report for 01/24/2019 Arrival Information Details Patient Name: [name], [name] Date of Service: 01/24/2019 8:00 AM Medical Record Number: [id number] Patient Account Number: [id number] Date of Birth/Sex: 07-13-1951 (68 y.o. M) Treating RN: Army Melia Primary Care Tyner Codner: [name] Other Clinician: Referring Emile Ringgenberg: [name] Treating Jailey Booton/Extender: [name], HOYT Weeks in Treatment: 16 Visit Information History Since Last Visit Added or deleted any medications: No Patient Arrived: Wheel Chair Any new allergies or adverse reactions: No Arrival Time: 08:13 Had a fall or experienced change in No Accompanied By: wife activities of daily living that may affect Transfer Assistance: None risk of falls: Patient Requires Transmission-Based No Signs or symptoms of abuse/neglect since last visito No Precautions: Hospitalized since last visit: No Patient Has Alerts: Yes Has Dressing in Place as Prescribed: Yes Patient Alerts: NOT Pain Present Now: No Diabetic Electronic Signature(s) Signed: 01/24/2019 4:22:26 PM By: Army Melia Entered By: Army Melia on 01/24/2019 08:14:02 [name] ([id number]) -------------------------------------------------------------------------------- Lower Extremity Assessment Details Patient Name: [name] Date of Service: 01/24/2019 8:00 AM Medical Record Number: [id number] Patient Account Number: [id number] Date of Birth/Sex: [birth year] (68 y.o. M) Treating RN: Army Melia Primary Care Nika Yazzie: [name] Other Clinician: Referring La Shehan: [name] Treating Dagmawi Venable/Extender: [name], HOYT Weeks in Treatment: 60 Electronic Signature(s) Signed: 01/24/2019 4:22:26 PM By: Army Melia Entered By: Army Melia on 01/24/2019 08:20:59 REHMAN, LEVINSON ([id number]) -------------------------------------------------------------------------------- Multi  Wound Chart Details Patient Name: [name] Date of Service: 01/24/2019 8:00 AM Medical Record Number: [id number] Patient Account Number: [id number] Date of Birth/Sex: 1951-03-02 (68 y.o. M) Treating RN: [name] Primary Care Onetta Spainhower: [name] Other Clinician: Referring Patrena Santalucia: [name] Treating Ollis Daudelin/Extender: [name], HOYT Weeks in Treatment: 60 Vital Signs Height(in): 73 Pulse(bpm): 73 Weight(lbs): 210 Blood Pressure(mmHg): 131/84 Body Mass Index(BMI): 28 Temperature(F): 98.4 Respiratory Rate 16 (breaths/min): Photos: [N/A:N/A] Wound Location: Right Gluteal fold N/A N/A Wounding Event: Pressure Injury N/A N/A Primary Etiology: Pressure Ulcer N/A N/A Comorbid History: Cataracts, Hypertension, N/A N/A Paraplegia Date Acquired: 11/02/2017 N/A N/A Weeks of Treatment: 60 N/A N/A Wound Status: Open N/A N/A Measurements L x W x D 2.7x2.7x2.2 N/A N/A (cm) Area (cm) : 5.726 N/A N/A Volume (cm) : 12.596 N/A N/A % Reduction in Area: 43.00% N/A N/A % Reduction in Volume: -1153.30% N/A N/A Position 1 (o'clock): 12 Maximum Distance 1 (cm): 2.2 Tunneling: Yes N/A N/A Classification: Category/Stage IV N/A N/A Exudate Amount: Large N/A N/A Exudate Type: Serous N/A N/A Exudate Color: amber N/A N/A Wound Margin: Epibole N/A N/A Granulation Amount: Large (67-100%) N/A N/A Granulation Quality: Red, Pink, Hyper-granulation N/A N/A Necrotic Amount: Small (1-33%) N/A N/A Exposed Structures: Fat Layer (Subcutaneous N/A N/A Tissue) Exposed: Yes Muscle: Yes Fascia: No BRAELIN, COSTLOW ([id number]) Tendon: No Joint: No Bone: No Epithelialization: None N/A N/A Treatment Notes Electronic Signature(s) Signed: 01/24/2019 5:43:16 PM By: Gretta Cool, BSN, RN, CWS, Kim RN, BSN Entered By: Gretta Cool, BSN, RN, CWS, Kim on 01/24/2019 08:33:14 DENNIE, MOLTZ  ([id number]) -------------------------------------------------------------------------------- [place] Details Patient Name: [name] Date of Service: 01/24/2019 8:00 AM Medical Record Number: [id number] Patient Account Number: [id number] Date of Birth/Sex: 19-May-1951 (68 y.o. M) Treating RN: [name] Primary Care Vista Sawatzky: [name] Other Clinician: Referring Carrington Olazabal: [name] Treating Kinnley Paulson/Extender: [name], HOYT Weeks in Treatment: 22 Active Inactive Orientation to the Wound Care Program Nursing Diagnoses: Knowledge deficit related to the wound healing center program Goals: Patient/caregiver will verbalize understanding of the Wound Healing  Center Program Date Initiated: 11/30/2017 Target Resolution Date: 12/21/2017 Goal Status: Active Interventions: Provide education on orientation to the wound center Notes: Pressure Nursing Diagnoses: Knowledge deficit related to management of pressures ulcers Goals: Patient/caregiver will verbalize understanding of pressure ulcer management Date Initiated: 05/17/2018 Target Resolution Date: 05/28/2018 Goal Status: Active Interventions: Assess: immobility, friction, shearing, incontinence upon admission and as needed Notes: Wound/Skin Impairment Nursing Diagnoses: Impaired tissue integrity Goals: Patient/caregiver will verbalize understanding of skin care regimen Date Initiated: 11/30/2017 Target Resolution Date: 12/21/2017 Goal Status: Active Ulcer/skin breakdown will have a volume reduction of 30% by week 4 Date Initiated: 11/30/2017 Target Resolution Date: 12/21/2017 Goal Status: Active EVERRETT, LACASSE ([id number]) Interventions: Assess patient/caregiver ability to obtain necessary supplies Assess patient/caregiver ability to perform ulcer/skin care regimen upon admission and as needed Assess ulceration(s) every visit Treatment Activities: Skin care regimen initiated :  11/30/2017 Notes: Electronic Signature(s) Signed: 01/24/2019 5:43:16 PM By: Gretta Cool, BSN, RN, CWS, Kim RN, BSN Entered By: Gretta Cool, BSN, RN, CWS, Kim on 01/24/2019 08:33:02 EINO, WHITNER ([id number]) -------------------------------------------------------------------------------- Pain Assessment Details Patient Name: [name] Date of Service: 01/24/2019 8:00 AM Medical Record Number: [id number] Patient Account Number: [id number] Date of Birth/Sex: 07-09-1951 (68 y.o. M) Treating RN: Army Melia Primary Care Sanika Brosious: [name] Other Clinician: Referring Macayla Ekdahl: [name] Treating Timber Lucarelli/Extender: [name], HOYT Weeks in Treatment: 60 Active Problems Location of Pain Severity and Description of Pain Patient Has Paino No Site Locations Pain Management and Medication Current Pain Management: Electronic Signature(s) Signed: 01/24/2019 4:22:26 PM By: Army Melia Entered By: Army Melia on 01/24/2019 08:14:10 [name] ([id number]) -------------------------------------------------------------------------------- Wound Assessment Details Patient Name: [name] Date of Service: 01/24/2019 8:00 AM Medical Record Number: [id number] Patient Account Number: [id number] Date of Birth/Sex: 1951-02-04 (68 y.o. M) Treating RN: Army Melia Primary Care Jeily Guthridge: [name] Other Clinician: Referring Naven Giambalvo: [name] Treating Maribelle Hopple/Extender: [name], HOYT Weeks in Treatment: 60 Wound Status Wound Number: 1 Primary Etiology: Pressure Ulcer Wound Location: Right Gluteal fold Wound Status: Open Wounding Event: Pressure Injury Comorbid History: Cataracts, Hypertension, Paraplegia Date Acquired: 11/02/2017 Weeks Of Treatment: 60 Clustered Wound: No Photos Wound Measurements Length: (cm) 2.7 Width: (cm) 2.7 Depth: (cm) 2.2 Area: (cm) 5.726 Volume: (cm) 12.596 % Reduction in Area: 43% % Reduction in Volume:  -1153.3% Epithelialization: None Tunneling: Yes Position (o'clock): 12 Maximum Distance: (cm) 2.2 Undermining: No Wound Description Classification: Category/Stage IV Wound Margin: Epibole Exudate Amount: Large Exudate Type: Serous Exudate Color: amber Foul Odor After Cleansing: No Slough/Fibrino Yes Wound Bed Granulation Amount: Large (67-100%) Exposed Structure Granulation Quality: Red, Pink, Hyper-granulation Fascia Exposed: No Necrotic Amount: Small (1-33%) Fat Layer (Subcutaneous Tissue) Exposed: Yes Necrotic Quality: Adherent Slough Tendon Exposed: No Muscle Exposed: Yes Necrosis of Muscle: No Joint Exposed: No CAELUM, FEDERICI. ([id number]) Bone Exposed: No Electronic Signature(s) Signed: 01/24/2019 4:22:26 PM By: Army Melia Entered By: Army Melia on 01/24/2019 08:20:42 [name] ([id number]) -------------------------------------------------------------------------------- Vitals Details Patient Name: [name] Date of Service: 01/24/2019 8:00 AM Medical Record Number: [id number] Patient Account Number: [id number] Date of Birth/Sex: [birth year] (68 y.o. M) Treating RN: Army Melia Primary Care Kwanza Cancelliere: [name] Other Clinician: Referring Dilynn Munroe: [name] Treating Yaseen Gilberg/Extender: [name], HOYT Weeks in Treatment: 60 Vital Signs Time Taken: 08:14 Temperature (F): 98.4 Height (in): 73 Pulse (bpm): 73 Weight (lbs): 210 Respiratory Rate (breaths/min): 16 Body Mass Index (BMI): 27.7 Blood Pressure (mmHg): 131/84 Reference Range: 80 - 120 mg / dl Electronic Signature(s) Signed: 01/24/2019 4:22:26 PM By:  Nicki Reaper, Dajea Entered By: Army Melia on 01/24/2019 08:14:35

## 2019-01-25 NOTE — Progress Notes (Signed)
Brett, [name] ([id number]) Visit Report for 01/24/2019 Chief Complaint Document Details Patient Name: Brett, [name] Date of Service: 01/24/2019 8:00 AM Medical Record Number: [id number] Patient Account Number: [id number] Date of Birth/Sex: 03-31-1951 (68 y.o. M) Treating RN: Harold Barban Primary Care Provider: [name] Other Clinician: Referring Provider: [name] Treating Provider/Extender: [name], HOYT Weeks in Treatment: 60 Information Obtained from: Patient Chief Complaint Right gluteal fold ulcer Electronic Signature(s) Signed: 01/24/2019 9:34:43 AM By: Worthy Keeler [name] Entered By: Worthy Keeler on 01/24/2019 08:19:40 [name], [name] ([id number]) -------------------------------------------------------------------------------- Debridement Details Patient Name: [name] Date of Service: 01/24/2019 8:00 AM Medical Record Number: [id number] Patient Account Number: [id number] Date of Birth/Sex: 1951-02-06 (68 y.o. M) Treating RN: [name] Primary Care Provider: [name] Other Clinician: Referring Provider: [name] Treating Provider/Extender: [name], HOYT Weeks in Treatment: 60 Debridement Performed for Wound #1 Right Gluteal fold Assessment: Performed By: Physician [name], HOYT E., [name] Debridement Type: Debridement Level of Consciousness (Pre- Awake and Alert procedure): Pre-procedure Verification/Time Yes - 08:34 Out Taken: Start Time: 08:34 Pain Control: Lidocaine Total Area Debrided (L x W): 1 (cm) x 1 (cm) = 1 (cm) Tissue and other material Viable, Non-Viable, Slough, Subcutaneous, Slough debrided: Level: Skin/Subcutaneous Tissue Debridement Description: Excisional Instrument: Curette Bleeding: Moderate Hemostasis Achieved: Pressure End Time: 08:37 Response to Treatment: Procedure was tolerated well Level of Consciousness Awake and Alert (Post-procedure): Post Debridement  Measurements of Total Wound Length: (cm) 2.7 Stage: Category/Stage IV Width: (cm) 2.7 Depth: (cm) 2.2 Volume: (cm) 12.596 Character of Wound/Ulcer Post Stable Debridement: Post Procedure Diagnosis Same as Pre-procedure Electronic Signature(s) Signed: 01/24/2019 9:34:43 AM By: Worthy Keeler [name] Signed: 01/24/2019 5:43:16 PM By: Gretta Cool, BSN, RN, CWS, Kim RN, BSN Entered By: Gretta Cool, BSN, RN, CWS, Kim on 01/24/2019 08:36:33 Brett, [name] ([id number]) -------------------------------------------------------------------------------- HPI Details Patient Name: [name] Date of Service: 01/24/2019 8:00 AM Medical Record Number: [id number] Patient Account Number: [id number] Date of Birth/Sex: [birth year] (68 y.o. 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 He states that there is no history of diabetes that he is aware of. He does have issues with his prostate and is currently receiving treatment for this by way of oral medication. With that being said I do not have a lot of details in that regard. Nonetheless the patient presents today as a result of having been referred to [place] by another provider initially home health was set to come out and take care of his wound although due to the fact that he apparently drives he's not able to receive home health. His wife is therefore trying to help take care of this wound within although they have been struggling with what exactly to do at this point. She states that she can do some things but  she is definitely not a nurse and does have some issues with looking at blood. The good news is the wound does not appear to be too deep and is fairly superficial at this point. There is no slough noted there is some nonviable skin noted around the surface of the  wound and the perimeter at this point. The central portion of the wound appears to be very good with a dermal layer noted this does not appear to be again deep enough to extend it to subcutaneous tissue at this point. 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 In the pump he has Dilaudid and baclofen. 12/07/17 on evaluation today patient presents for follow-up concerning his ongoing lower back thigh ulcer on the right. He states that he did not get the supplies ordered and therefore has not really been able to perform the dressing changes as directed exactly. His wife was able to get some Boarder Foam Dressing's from the drugstore and subsequently has been using hydrogel which did help to a degree in the wound does appear to be able smaller. There is actually more drainage this week noted than previous. 12/21/17 on evaluation today patient appears to be doing rather well in regard to his right gluteal ulcer. He has been tolerating the dressing changes without complication. There does not appear to be any evidence of infection at this point in time. Overall the wound does seem to be making some progress as far as the edges are concerned there's not as much in the way of overlapping of the external wound edges and he has a good epithelium to wound bed border for the most part. This however is not true right at the 12 o'clock location over the span of a little over a centimeters which actually will require debridement today to clean this away and hopefully allow it to continue to heal more appropriately. 12/28/17 on evaluation today patient  appears to be doing rather well in regard to his ulcer in the left gluteal region. He's been tolerating the dressing changes without complication. Apparently he has had some difficulty getting his dressing material. Apparently there's been some confusion with ordering we're gonna check into this. Nonetheless overall he's been showing signs of improvement which is good news. Debridement is not required today. 01/04/18 on evaluation today patient presents for follow-up concerning his right gluteal ulcer. He has been tolerating the dressing changes fairly well. On inspection today it appears he may actually have some maceration them concerned about the fact that he may be developing too much moisture in and around the wound bed which can cause delay in healing. With that being said he unfortunately really has not showed significant signs of improvement since last week's evaluation in fact this may even be just the little bit/slightly larger. Nonetheless he's been having a lot of discomfort I'm not sure this is even related to the wound as he has no pain when I'm to breeding or otherwise cleaning the wound during evaluation today. Nonetheless this is something that we did recommend he talked to his pain specialist concerning. 01/11/18 on evaluation today patient appears to be doing better in regard to his ulceration. He has been tolerating the dressing changes without complication. With that being said overall there's no evidence of infection which is good news. The only thing is he did receive the hatch affair blue classic versus the ready nonetheless I feel like this is perfectly fine and appears to have done well for him over the past week. 01/25/18 on evaluation today patient's wound actually appears to be a little bit larger than during the last evaluation. The good CHET, GREENLEY. ([id number]) news is the majority of the wound  edges actually appear to be fairly firmly attached to the wound bed  unfortunately again we're not really making progress in regard to the size. Roughly the wound is about the same size as when I first saw him although again the wound margin/edges appear to be much better. 02/01/18 on evaluation today patient actually appears to be doing very well in regard to his wound. Applying the Prisma dry does seem to be better although he does still have issues with slow progression of the wound. There was a slight improvement compared to last week's measurements today. Nonetheless I have been considering other options as far as the possibility of Theraskin or even a snap vac. In general I'm not sure that the Theraskin due to location of the wound would be a very good idea. Nonetheless I do think that a snap vac could be a possibility for the patient and in fact I think this could even be an excellent way to manage the wound possibly seeing some improvement in a very rapid fashion here. Nonetheless this is something that we would need to get approved and I did have a lengthy conversation with the patient about this today. 02/08/18 on evaluation today patient appears to be doing a little better in regard to his ulcer. He has been tolerating the dressing changes without complication. Fortunately despite the fact that the wound is a little bit smaller it's not significantly so unfortunately. We have discussed the possibility of a snap vac we did check with insurance this is actually covered at this point. Fortunately there does not appear to be any sign of infection. Overall I'm fairly pleased with how things seem to be appearing at this point. 02/15/18 on evaluation today patient appears to be doing rather well in regard to his right gluteal ulcer. Unfortunately the snap vac did not stay in place with his sheer and friction this came loose and did not seem to maintain seal very well. He worked for about two days and it did seem to do very well during that time according to his wife  but in general this does not seem to be something that's gonna be beneficial for him long-term. I do believe we need to go back to standard dressings to see if we can find something that will be of benefit. 03/02/18- He is here in follow up evaluation; there is minimal change in the wound. He will continue with the same treatment plan, would consider changing to iodosrob/iodoflex if ulcer continues to to plateau. He will follow up next week 03/08/18 on evaluation today patient's wound actually appears to be about the same size as when I previously saw him several weeks back. Unfortunately he does have some slightly dark discoloration in the central portion of the wound which has me concerned about pressure injury. I do believe he may be sitting for too long a period of time in fact he tells me that "I probably sit for much too long". He does have some Slough noted on the surface of the wound and again as far as the size of the wound is concerned I'm really not seeing anything that seems to have improved significantly. 03/15/18 on evaluation today patient appears to be doing fairly well in regard to his ulcer. The wound measured pretty much about the same today compared to last week's evaluation when looking at his graph. With that being said the area of bruising/deep tissue injury that was noted last week I do not see at this  point. He did get a new cushion fortunately this does seem to be have been of benefit in my pinion. It does appear that he's been off of this more which is good news as well I think that is definitely showing in the overall wound measurements. With that being said I do believe that he needs to continue to offload I don't think that the fact this is doing better should be or is going to allow him to not have to offload and explain this to him as well. Overall he seems to be in agreement the plan I think he understands. The overall appearance of the wound bed is improved compared to  last week I think the Iodoflex has been beneficial in that regard. 03/29/18 on evaluation today patient actually appears to be doing rather well in regard to his wound from the overall appearance standpoint he does have some granulation although there's some Slough on the surface of the wound noted as well. With that being said he unfortunately has not improved in regard to the overall measurement of the wound in volume or in size. I did have a discussion with him very specifically about offloading today. He actually does work although he mainly is just sitting throughout the day. He tells me he offloads by "lifting himself up for 30 seconds off of his chair occasionally" purchase from [employer] which does seem to have helped. And he has a new cushion that he with that being said he's also able to stand some for a very short period of time but not significant enough I think to provide appropriate offloading. I think the biggest issue at this point with the wound and the fact is not healing as quickly as we would like is due to the fact that he is really not able to appropriately offload while at work. He states the beginning after his injury he actually had a bed at his job that he could lay on in order to offload and that does seem to have been of help back at that time. Nonetheless he had not done this in quite some time unfortunately. I think that could be helpful for him this is something I would like for him to look into. 04/05/18 on evaluation today patient actually presents for follow-up concerning his right gluteal ulcer. Again he really is not significantly improved even compared to last week. He has been tolerating the dressing changes without complication. With that being said fortunately there appears to be no evidence of infection at this time. He has been more proactive in trying to offload. CHRISTOPHERJOHN, SCHIELE ([id number]) 04/12/18 on evaluation today patient actually appears to  be doing a little better in regard to his wound and the right gluteal fold region. He's been tolerating the dressing changes since removing the oasis without complication. However he was having a lot of burning initially with the oasis in place. He's unsure of exactly why this was given so much discomfort but he assumes that it was the oasis itself causing the problem. Nonetheless this had to be removed after about three days in place although even those three days seem to have made a fairly good improvement in regard to the overall appearance of the wound bed. In fact is the first time that he's made any improvement from the standpoint of measurements in about six weeks. He continues to have no discomfort over the area of the wound itself which leads me to wonder why he was having  the burning with the oasis when he does not even feel the actual debridement's themselves. I am somewhat perplexed by this. 04/19/18 on evaluation today patient's wound actually appears to be showing signs of epithelialization around the edge of the wound and in general actually appears to be doing better which is good news. He did have the same burning after about three days with applying the Endoform last week in the same fashion that I would generally apply a skin substitute. This seems to indicate that it's not the oasis to cause the problem but potentially the moisture buildup that just causes things to burn or there may be some other reaction with the skin prep or Steri-Strips. Nonetheless I'm not sure that is gonna be able to tolerate any skin substitute for a long period of time. The good news is the wound actually appears to be doing better today compared to last week and does seem to finally be making some progress. 04/26/18 on evaluation today patient actually appears to be doing rather well in regard to his ulcer in the right gluteal fold. He has been tolerating the dressing changes without complication which is  good news. The Endoform does seem to be helping although he was a little bit more macerated this week. This seems to be an ongoing issue with fluid control at this point. Nonetheless I think we may be able to add something like Drawtex to help control the drainage. 05/03/18 on evaluation today patient appears to actually be doing better in regard to the overall appearance of his wound. He has been tolerating the dressing changes without complication. Fortunately there appears to be no evidence of infection at this time. I really feel like his wound has shown signs as of today of turning around last week I thought so as well and definitely he could be seen in this week's overall appearance and measurements. In general I'm very pleased with the fact that he finally seems to be making a steady but sure progress. The patient likewise is very pleased. 05/17/18 on evaluation today patient appears to be doing more poorly unfortunately in regard to his ulcer. He has been tolerating the dressing changes without complication. With that being said he tells me that in the past couple of days he and his wife have noticed that we did not seem to be doing quite as well is getting dark near the center. Subsequently upon evaluation today the wound actually does appear to be doing worse compared to previous. He has been tolerating the dressing changes otherwise and he states that he is not been sitting up anymore than he was in the past from what he tells me. Still he has continued to work he states "I'm tired of dealing with this and if I have to just go home and lay in the bed all the time that's what I'll do". Nonetheless I am concerned about the fact that this wound does appear to be deeper than what it was previous. 05/24/18 upon evaluation today patient actually presents after having been in the hospital due to what was presumed to be sepsis secondary to the wound infection. He had an elevated white blood cell  count between 14 and 15. With that being said he does seem to be doing somewhat better now. His wound still is giving him some trouble nonetheless and he is obviously concerned about the fact likely talked about that this does seem to go more deeply than previously noted. I did review his  wound culture which showed evidence of Staphylococcus aureus him and group B strep. Nonetheless he is on antibiotics, Levaquin, for this. Subsequently I did review his intake summary from the hospital as well. I also did look at the CT of the lumbar spine with contrast that was performed which showed no bone destruction to suggest lumbar disguises/osteomyelitis or sacral osteomyelitis. There was no paraspinal abscess. Nonetheless it appears this may have been more of just a soft tissue infection at this point which is good news. He still is nonetheless concerned about the wound which again I think is completely reasonable considering everything he's been through recently. 05/31/18 on evaluation today on evaluation today patient actually appears to be showing signs of his wound be a little bit deeper than what I would like to see. Fortunately he does not show any signs of significant infection although his temperature was 99 today he states he's been checking this at home and has not been elevated. Nonetheless with the undermining that I'm seeing at this point I am becoming more concerned about the wound I do think that offloading is a key factor here that is preventing the speedy recovery at this point. There does not appear to be any evidence of again over infection noted. He's been using Santyl currently. 06/07/18 the patient presents today for follow-up evaluation regarding the left ulcer in the gluteal region. He has been tolerating the Wound VAC fairly well. He is obviously very frustrated with this he states that to mean is really getting in his way. There does not appear to be any evidence of infection at  this time he does have a little bit of odor I do not necessarily associate this with infection just something that we sometimes notice with Wound VAC therapy. With that being said I can definitely catch a tone of discontentment overall in the patient's demeanor today. This when he was previously in the hospital ROSHARD, REZABEK. ([id number]) an CT scan was done of the lumbar region which did not reveal any signs of osteomyelitis. With that being said the pelvis in particular was not evaluated distinctly which means he could still have some osteonecrosis I. Nonetheless the Wound VAC was started on Thursday I do want to get this little bit more time before jumping to a CT scan of the pelvis although that is something that I might would recommend if were not see an improvement by that time. 06/14/18 on evaluation today patient actually appears to be doing about the same in regard to his right gluteal ulcer. Again he did have a CT scan of the lumbar spine unfortunately this did not include the pelvis. Nonetheless with the depth of the wound that I'm seeing today even despite the fact that I'm not seeing any evidence of overt cellulitis I believe there's a good chance that we may be dealing with osteomyelitis somewhere in the right Ischial region. No fevers, chills, nausea, or vomiting noted at this time. 06/21/18 on evaluation today patient actually appears to be doing about the same with regard to his wound. The tunnel at 6 o'clock really does not appear to be any deeper although it is a little bit wider. I think at this point you may want to start packing this with white phone. Unfortunately I have not got approval for the CT scan of the pelvis as of yet due to the fact that Medicare apparently has been denied it due to the diagnosis codes not being appropriate according to Medicare for  the test requested. With that being said the patient cannot have an MRI and therefore this is the only option that  we have as far as testing is concerned. The patient has had infection and was on antibiotics and been added code for cellulitis of the bottom to see if this will be appropriate for getting the test approved. Nonetheless I'm concerned about the infection have been spread deeper into the Ischial region. 06/28/18 on evaluation today patient actually appears to be doing rather well all things considered in regard to the right gluteal ulcer. He has been tolerating the dressing changes without complication. With that being said the Wound VAC he states does have to be replaced almost every day or at least reinforced unfortunately. Patient actually has his CT scan later this morning we should have the results by tomorrow. 07/05/18 on evaluation today patient presents for follow-up concerning his right Ischial ulcer. He did see the surgeon Dr. Lysle Pearl last week. They were actually very happy with him and felt like he spent a tremendous amount of time with them as far as discussing his situation was concerned. In the end Dr. Lysle Pearl did contact me as well and determine that he would not recommend any surgical intervention at this point as he felt like it would not be in the patient's best interest based on what he was seeing. He recommended a referral to infectious disease. Subsequently this is something that Dr. Ines Bloomer office is working on setting up for the patient. As far as evaluation today is concerned the patient's wound actually appears to be worse at this point. I am concerned about how things are progressing and specifically about infection. I do not feel like it's the deeper but the area of depth is definitely widening which does have me concerned. No fevers, chills, nausea, or vomiting noted at this time. I think that we do need initiate antibiotic therapy the patient has an allow allergy to amoxicillin/penicillin he states that he gets a rash since childhood. Nonetheless she's never had the issues  with [employer] or cephalosporins in general but he is aware of. 07/27/18 on evaluation today patient presents following admission to the hospital on 07/09/18. He was subsequently discharged on 07/20/18. On 07/15/18 the patient underwent irrigation and debridement was soft tissue biopsy and bone biopsy as well as placement of a Wound VAC in the OR by Dr. Celine Ahr. During the hospital course the patient was placed on a Wound VAC and recommended follow up with surgery in three weeks actually with Dr. Delaine Lame who is infectious disease. The patient was on vancomycin during the hospital course. 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 He also had a bone culture which revealed evidence of methicillin-resistant staph aureus. He is updated CT scan 07/09/18 reveals that he had progression of the which was performed on wound to breakdown down to the trochanter where he actually had irregularities there as well suggestive of osteomyelitis. This was a change just since 9 December when we last performed a CT scan. Obviously this one had gone downhill quite significantly and rapidly. At this point upon evaluation I feel like in general the patient's wound seems to be doing fairly well all things considered upon my evaluation today. Obviously this is larger and deeper than what I previously evaluated but at the same time he seems to be making some progress as far as the appearance of the granulation tissue is concerned. I'm happy in that regard. No fevers,  chills, nausea, or vomiting noted at this time. He is on IV vancomycin and Rocephin at the facility. He is currently in [facility]. 08/03/18 upon evaluation today patient's wound appears to be doing better in regard to the overall appearance at this point in time. Fortunately he's been tolerating the Wound VAC without complication and states that the facility has been taking excellent care of the wound site. Overall I  see some Slough noted on the surface which I am going to attempt sharp debridement today of but nonetheless other than this I feel like he's making progress. 08/09/18 on evaluation today patient's wound appears to be doing much better compared to even last week's evaluation. Do believe that the Wound VAC is been of great benefit for him. He has been tolerating the dressing changes that is the Wound TAIJON, VINK. ([id number]) VAC without any complication and he has excellent granulation noted currently. There is no need for sharp debridement at this point. 08/16/18 on evaluation today patient actually appears to be doing very well in regard to the wound in the right gluteal fold region. This is showing signs of progress and again appears to be very healthy which is excellent news. Fortunately there is no sign of active infection by way of odor or drainage at this point. Overall I'm very pleased with how things stand. He seems to be tolerating the Wound VAC without complication. 08/23/18 on evaluation today patient actually appears to be doing better in regard to his wound. He has been tolerating the Wound VAC without complication and in fact it has been collecting a significant amount of drainage which I think is good news especially considering how the wound appears. Fortunately there is no signs of infection at this time definitely nothing appears to be worse which is good news. He has not been started on the Bactrim and Flagyl that was recommended by Dr. Delaine Lame yet. I did actually contact her office this morning in order to check and see were things are that regard their gonna be calling me back. 08/30/18 on evaluation today patient actually appears to show signs of excellent improvement today compared to last evaluation. The undermining is getting much better the wound seems to be feeling quite nicely and I'm very pleased that the granulation in general. With that being said overall I  feel like the patient has made excellent progress which is great news. No fevers, chills, nausea, or vomiting noted at this time. 09/06/18 on evaluation today patient actually appears to be doing rather well in regard to his right gluteal ulcer. This is showing signs of improvement in overall I'm very pleased with how things seem to be progressing. The patient likewise is please. Overall I see no evidence of infection he is about to complete his oral antibiotic regimen which is the end of the antibiotics for him in just about three days. 09/13/18 on evaluation today patient's right Ischial ulcer appears to be showing signs of continued improvement which is excellent news. He's been tolerating the dressing changes without complication. Fortunately there's no signs of infection and the wound that seems to be doing very well. 09/28/18 on evaluation today patient appears to be doing rather well in regard to his right Ischial ulcer. He's been tolerating the Wound VAC without complication he knows there's much less drainage than there used to be this obviously is not a bad thing in my pinion. There's no evidence of infection despite the fact is but nothing about it now  for several weeks. 10/04/18 on evaluation today patient appears to be doing better in regard to his right Ischial wound. He has been tolerating the Wound VAC without complication and I do believe that the silver nitrate last week was beneficial for him. Fortunately overall there's no evidence of active infection at this time which is great news. No fevers, chills, nausea, or vomiting noted at this time. 10/11/18 on evaluation today patient actually appears to be doing rather well in regard to his Ischial ulcer. He's been tolerating the Wound VAC still without complication I feel like this is doing a good job. No fevers, chills, nausea, or vomiting noted at this time. 11/01/18 on evaluation today patient presents after having not been seen in  our clinic for several weeks secondary to the fact that he was on evaluation today patient presents after having not been seen in our clinic for several weeks secondary to the fact that he was in a skilled nursing facility which was on lockdown currently due to the covert 19 national emergency. Subsequently he was discharged from the facility on this past Friday and subsequently made an appointment to come in to see yesterday. Fortunately there's no signs of active infection at this time which is good news and overall he does seem to have made progress since I last saw. Overall I feel like things are progressing quite nicely. The patient is having no pain. 11/08/18 on evaluation today patient appears to be doing okay in regard to his right gluteal ulcer. He has been utilizing a Wound VAC home health this changing this at this point since he's home from the skilled nursing facility. Fortunately there's no signs of obvious active infection at this time. Unfortunately though there's no obvious active infection he is having some maceration and his wife states that when the sheets of the Wound VAC office on Sunday when it broke seal that he ended up having significant issues with some smell as well there concerned about the possibility of infection. Fortunately there's No fevers, chills, nausea, or vomiting noted at this time. 11/15/18 on evaluation today patient actually appears to be doing well in regard to his right gluteal ulcer. He has been tolerating the dressing changes without complication. Specifically the Wound VAC has been utilized up to this point. Fortunately there's no signs of infection and overall I feel like he has made progress even since last week when I last saw him. I'm actually fairly happy with the overall appearance although he does seem to have somewhat of a hyper granular Scheibel, Deontrey J. ([id number]) overgrowth in the central portion of the wound which I think may require some  sharp debridement to try flatness out possibly utilizing chemical cauterization following. 11/23/18 on evaluation today patient actually appears to be doing very well in regard to his sacral ulcer. He seems to be showing signs of improvement with good granulation. With that being said he still has the small area of hyper granulation right in the central portion of the wound which I'm gonna likely utilize silver nitrate on today. Subsequently he also keeps having a leak at the 6 o'clock location which is unfortunate we may be able to help out with some suggestions to try to prevent this going forward. Fortunately there's no signs of active infection at this time. 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 He's been tolerating the dressing changes without complication.  Fortunately there's no signs of active infection at this time. I've been rather pleased with how things have progressed there still some evidence of pressure getting to the area with some redness right around the immediate wound opening. Nonetheless other than this I'm not seeing any significant complications or issues the wound is somewhat hyper granular. Upon discussing with the patient and his wife today I'm not sure that the wound is being packed to the base with the foam at this point. And if it's not been packed fully that may be part of the reason why is not seen as much improvement as far as the granulation from the base out. Again we do not want pack too tightly but we need some of the firm to get to the base of the wound. I discussed this with patient and his wife today. 12/06/18 on evaluation today patient appears to be doing well in regard to his right gluteal pressure ulcer. He's been tolerating the dressing changes without complication. Fortunately there's no signs of active infection. He still has some hyper granular tissue and  I do think it would be appropriate to continue with the chemical cauterization as of today. 12/16/18 on evaluation today patient actually appears to be doing okay in regard to his right gluteal ulcer. He is been tolerating the dressing changes without complication including the Wound VAC. Overall I feel like nothing seems to be worsening I do feel like that the hyper granulation buds in the central portion of the wound have improved to some degree with the silver nitrate. We will have to see how things continue to progress. 12/20/18 on evaluation today patient actually appears to be doing much worse in my pinion even compared to last week's evaluation. Unfortunately as opposed to showing any signs of improvement the areas of hyper granular tissue in the central portion of the wound seem to be getting worse. Subsequently the wound bed itself also seems to be getting deeper even compared to last week which is both unusual as well as concerning since prior he had been shown signs of improvement. Nonetheless I think that the issue could be that he's actually having some difficulty in issues with a deeper infection. There's no external signs of infection but nonetheless I am more worried about the internal, osteomyelitis, that could be restarting. He has not been on antibiotics for some time at this point. I think that it may be a good idea to go ahead and started back on an antibiotic therapy while we wait to see what the testing shows. 12/27/18 on evaluation today patient presents for follow-up concerning his left gluteal fold wound. Fortunately he appears to be doing well today. I did review the CT scan which was negative for any signs of osteomyelitis or acute abnormality this is excellent news. Overall I feel like the surface of the wound bed appears to be doing significantly better today compared to previously noted findings. There does not appear any signs of infection nor does he have any pain at this  time. 01/03/19 on evaluation today patient actually appears to be doing quite well in regard to his ulcer. Post debridement last week he really did not have too much bleeding which is good news. Fortunately today this seems to be doing some better but we still has some of the hyper granular tissue noted in the base of the wound which is gonna require sharp debridement today as well. Overall I'm pleased with how things seem to be progressing  since we switched away from the Wound VAC I think he is making some progress. 01/10/19 on evaluation today patient appears to be doing better in regard to his right gluteal fold ulcer. He has been tolerating the dressing changes without complication. The debridement to seem to be helping with current away some of the poor hyper granular tissue bugs throughout the region of his gluteal fold wound. He's been tolerating the dressing changes otherwise without complication which is great news. No fevers, chills, nausea, or vomiting noted at this time. 01/17/19 on evaluation today patient actually appears to be doing excellent in regard to his wound. He's been tolerating the dressing changes without complication. Fortunately there is no signs of active infection at this time which is great news. No fevers, chills, nausea, or vomiting noted at this time. 01/24/19 on evaluation today patient actually appears to be doing quite well with regard to his ulcer. He has been tolerating the dressing changes without complication. Fortunately there's no signs of active infection at this time. Overall been very pleased with the progress that he seems to be making currently. KHADIM, LUNDBERG ([id number]) Electronic Signature(s) Signed: 01/24/2019 9:34:43 AM By: Worthy Keeler [name] Entered By: Worthy Keeler on 01/24/2019 09:30:51 SUHAIL, PELOQUIN ([id number]) -------------------------------------------------------------------------------- Physical Exam Details Patient Name:  [name] Date of Service: 01/24/2019 8:00 AM Medical Record Number: [id number] Patient Account Number: [id number] Date of Birth/Sex: 1951/04/19 (68 y.o. M) Treating RN: Harold Barban Primary Care Provider: [name] Other Clinician: Referring Provider: [name] Treating Provider/Extender: [name], HOYT Weeks in Treatment: 69 Constitutional Well-nourished and well-hydrated in no acute distress. Respiratory normal breathing without difficulty. Psychiatric this patient is able to make decisions and demonstrates good insight into disease process. Alert and Oriented x 3. pleasant and cooperative. Notes At this point sharp debridement was performed to clear away [facility] and aquatic debris from the base of the wound fortunately he has no significant hyper granular lesions at this point and wound bed seems to be doing much better which is great news. Very pleased in this regard. Electronic Signature(s) Signed: 01/24/2019 9:34:43 AM By: Worthy Keeler [name] Entered By: Worthy Keeler on 01/24/2019 09:31:59 ROMARIO, TITH ([id number]) -------------------------------------------------------------------------------- Physician Orders Details Patient Name: [name] Date of Service: 01/24/2019 8:00 AM Medical Record Number: [id number] Patient Account Number: [id number] Date of Birth/Sex: March 23, 1951 (68 y.o. 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 Skin Barriers/Peri-Wound Care Wound #1 Right Gluteal fold o Other: - Apply zinc oxide to peri-wound area. Primary Wound Dressing Wound #1 Right Gluteal fold o Silver Alginate - pack lightly into wound Secondary Dressing Wound #1 Right Gluteal fold o ABD pad - Tegaderm to secure Dressing Change Frequency Wound #1 Right Gluteal fold o Change dressing every day. Follow-up Appointments Wound #1 Right Gluteal fold o Return Appointment in 1 week. Off-Loading Wound #1 Right Gluteal fold o Turn and reposition  every 2 hours NORMON, PETTIJOHN ([id number]) [employer] #1 Right Gluteal fold o [place] Nurse may visit PRN to address patientos wound care needs. o FACE TO FACE ENCOUNTER: MEDICARE and MEDICAID PATIENTS: I certify that this patient is under my care and that I had a face-to-face encounter that meets the physician face-to-face encounter requirements with this patient on this date. The encounter with the patient was in whole or in part for the following MEDICAL CONDITION: (primary reason for [place]) MEDICAL NECESSITY: I certify, that based on my findings, NURSING services are a medically necessary home health service. HOME BOUND STATUS: I certify that my clinical findings support that this patient is homebound (i.e., Due to illness or injury, pt requires aid of supportive devices such as crutches, cane, wheelchairs, walkers, the use of special transportation or the assistance of another person to leave their place of residence. There is a normal inability to leave the home and doing so requires considerable and taxing effort. Other absences are for medical reasons / religious services and are infrequent or of short duration when for  other reasons). o If current dressing causes regression in wound condition, may D/C ordered dressing product/s and apply Normal Saline Moist Dressing daily until next [place] / Other MD appointment. Mora of regression in wound condition at [phone]. o Please direct any NON-WOUND related issues/requests for orders to patient's Primary Care Physician Electronic Signature(s) Signed: 01/24/2019 9:34:43 AM By: Worthy Keeler [name] Signed: 01/24/2019 5:43:16 PM By: Gretta Cool, BSN, RN, CWS, Kim RN, BSN Entered By: Gretta Cool, BSN, RN, CWS, Kim on 01/24/2019 08:37:49 BURNEY, CALZADILLA ([id number]) -------------------------------------------------------------------------------- Problem List Details Patient Name: [name] Date of Service: 01/24/2019 8:00 AM Medical Record Number: [id number] Patient Account Number: [id number] Date of Birth/Sex: 02/04/1951 (68 y.o. M) Treating RN: Harold Barban Primary Care Provider: [name] Other Clinician: Referring Provider: [name] Treating Provider/Extender: [name], HOYT Weeks in Treatment: 60 Active Problems ICD-10 Evaluated Encounter Code Description Active Date Today Diagnosis L89.314 Pressure ulcer of right buttock, stage 4 11/30/2017 No Yes L03.317 Cellulitis of buttock 06/21/2018 No Yes G82.20 Paraplegia, unspecified 11/30/2017 No Yes S34.109S Unspecified injury to unspecified level of lumbar spinal cord, 11/30/2017 No Yes sequela I10 Essential (primary) hypertension 11/30/2017 No Yes Inactive Problems Resolved Problems Electronic Signature(s) Signed: 01/24/2019 9:34:43 AM By: Worthy Keeler [name] Entered By: Worthy Keeler on 01/24/2019 08:19:22 JOWAN, SKILLIN ([id number]) -------------------------------------------------------------------------------- Progress Note Details Patient Name: [name] Date of Service: 01/24/2019 8:00 AM Medical Record Number:  [id number] Patient Account Number: [id number] Date of Birth/Sex: 1950-11-24 (68 y.o. 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 He states that there is no history of diabetes that he is aware of. He does have issues with his prostate and is currently receiving treatment for this by way of oral medication. With that being said I do not have a lot of details in that regard. Nonetheless the patient presents today as a result of having  been referred to [place] by another provider initially home health was set to come out and take care of his wound although due to the fact that he apparently drives he's not able to receive home health. His wife is therefore trying to help take care of this wound within although they have been struggling with what exactly to do at this point. She states that she can do some things but she is definitely not a nurse and does have some issues with looking at blood. The good news is the wound does not appear to be too deep and is fairly superficial at this point. There is no slough noted there is some nonviable skin noted around the surface of the wound and the perimeter at this point. The central portion of the wound appears to be very good with a dermal layer noted this does not appear to be again deep enough to extend it to subcutaneous tissue at this point. Overall the patient for a paraplegic seems to be functioning fairly well he does have both a  spinal cord stimulator as well is the intrathecal pump. In the pump he has Dilaudid and baclofen. 12/07/17 on evaluation today patient presents for follow-up concerning his ongoing lower back thigh ulcer on the right. He states that he did not get the supplies ordered and therefore has not really been able to perform the dressing changes as directed exactly. His wife was able to get some Boarder Foam Dressing's from the drugstore and subsequently has been using hydrogel which did help to a degree in the wound does appear to be able smaller. There is actually more drainage this week noted than previous. 12/21/17 on evaluation today patient appears to be doing rather well in regard to his right gluteal ulcer. He has been tolerating the dressing changes without complication. There does not appear to be any evidence of infection at this point in time. Overall the wound does seem to be making some progress as far as the edges are concerned there's not as much in the way of overlapping of the external wound edges and he has a good epithelium to wound bed border for the most part. This however is not true right at the 12 o'clock location over the span of a little over a centimeters which actually will require debridement today to clean this away and hopefully allow it to continue to heal more appropriately. 12/28/17 on evaluation today patient appears to be doing rather well in regard to his ulcer in the left gluteal region. He's been tolerating the dressing changes without complication. Apparently he has had some difficulty getting his dressing material. Apparently there's been some confusion with ordering we're gonna check into this. Nonetheless overall he's been showing signs of improvement which is good news. Debridement is not required today. 01/04/18 on evaluation today patient presents for follow-up concerning his right gluteal ulcer. He has been tolerating the dressing changes fairly well. On inspection  today it appears he may actually have some maceration them concerned about the fact that he may be developing too much moisture in and around the wound bed which can cause delay in healing. With that being said he unfortunately really has not showed significant signs of improvement since last week's evaluation in fact this may even be just the little bit/slightly larger. Nonetheless he's been having a lot of discomfort I'm not sure this is even related to the wound as he has no pain when I'm to breeding or otherwise cleaning the  wound during evaluation today. Nonetheless this is something that we did recommend he talked to his pain specialist concerning. 01/11/18 on evaluation today patient appears to be doing better in regard to his ulceration. He has been tolerating the dressing Denz, Casten J. ([id number]) changes without complication. With that being said overall there's no evidence of infection which is good news. The only thing is he did receive the hatch affair blue classic versus the ready nonetheless I feel like this is perfectly fine and appears to have done well for him over the past week. 01/25/18 on evaluation today patient's wound actually appears to be a little bit larger than during the last evaluation. The good news is the majority of the wound edges actually appear to be fairly firmly attached to the wound bed unfortunately again we're not really making progress in regard to the size. Roughly the wound is about the same size as when I first saw him although again the wound margin/edges appear to be much better. 02/01/18 on evaluation today patient actually appears to be doing very well in regard to his wound. Applying the Prisma dry does seem to be better although he does still have issues with slow progression of the wound. There was a slight improvement compared to last week's measurements today. Nonetheless I have been considering other options as far as the possibility of  Theraskin or even a snap vac. In general I'm not sure that the Theraskin due to location of the wound would be a very good idea. Nonetheless I do think that a snap vac could be a possibility for the patient and in fact I think this could even be an excellent way to manage the wound possibly seeing some improvement in a very rapid fashion here. Nonetheless this is something that we would need to get approved and I did have a lengthy conversation with the patient about this today. 02/08/18 on evaluation today patient appears to be doing a little better in regard to his ulcer. He has been tolerating the dressing changes without complication. Fortunately despite the fact that the wound is a little bit smaller it's not significantly so unfortunately. We have discussed the possibility of a snap vac we did check with insurance this is actually covered at this point. Fortunately there does not appear to be any sign of infection. Overall I'm fairly pleased with how things seem to be appearing at this point. 02/15/18 on evaluation today patient appears to be doing rather well in regard to his right gluteal ulcer. Unfortunately the snap vac did not stay in place with his sheer and friction this came loose and did not seem to maintain seal very well. He worked for about two days and it did seem to do very well during that time according to his wife but in general this does not seem to be something that's gonna be beneficial for him long-term. I do believe we need to go back to standard dressings to see if we can find something that will be of benefit. 03/02/18- He is here in follow up evaluation; there is minimal change in the wound. He will continue with the same treatment plan, would consider changing to iodosrob/iodoflex if ulcer continues to to plateau. He will follow up next week 03/08/18 on evaluation today patient's wound actually appears to be about the same size as when I previously saw him several weeks  back. Unfortunately he does have some slightly dark discoloration in the central portion of the wound which  has me concerned about pressure injury. I do believe he may be sitting for too long a period of time in fact he tells me that "I probably sit for much too long". He does have some Slough noted on the surface of the wound and again as far as the size of the wound is concerned I'm really not seeing anything that seems to have improved significantly. 03/15/18 on evaluation today patient appears to be doing fairly well in regard to his ulcer. The wound measured pretty much about the same today compared to last week's evaluation when looking at his graph. With that being said the area of bruising/deep tissue injury that was noted last week I do not see at this point. He did get a new cushion fortunately this does seem to be have been of benefit in my pinion. It does appear that he's been off of this more which is good news as well I think that is definitely showing in the overall wound measurements. With that being said I do believe that he needs to continue to offload I don't think that the fact this is doing better should be or is going to allow him to not have to offload and explain this to him as well. Overall he seems to be in agreement the plan I think he understands. The overall appearance of the wound bed is improved compared to last week I think the Iodoflex has been beneficial in that regard. 03/29/18 on evaluation today patient actually appears to be doing rather well in regard to his wound from the overall appearance standpoint he does have some granulation although there's some Slough on the surface of the wound noted as well. With that being said he unfortunately has not improved in regard to the overall measurement of the wound in volume or in size. I did have a discussion with him very specifically about offloading today. He actually does work although he mainly is just  sitting throughout the day. He tells me he offloads by "lifting himself up for 30 seconds off of his chair occasionally" purchase from [employer] which does seem to have helped. And he has a new cushion that he with that being said he's also able to stand some for a very short period of time but not significant enough I think to provide appropriate offloading. I think the biggest issue at this point with the wound and the fact is not healing as quickly as we would like is due to the fact that he is really not able to appropriately offload while at work. He states the beginning after his injury he actually had a bed at his job that he could lay on in order to offload and that does seem to have been of help back at that time. Nonetheless he had not done this in quite some time unfortunately. I think that could be helpful for him this is something I would like for him to look into. RAMONE, GANDER ([id number]) 04/05/18 on evaluation today patient actually presents for follow-up concerning his right gluteal ulcer. Again he really is not significantly improved even compared to last week. He has been tolerating the dressing changes without complication. With that being said fortunately there appears to be no evidence of infection at this time. He has been more proactive in trying to offload. 04/12/18 on evaluation today patient actually appears to be doing a little better in regard to his wound and the right gluteal fold region. He's been tolerating  the dressing changes since removing the oasis without complication. However he was having a lot of burning initially with the oasis in place. He's unsure of exactly why this was given so much discomfort but he assumes that it was the oasis itself causing the problem. Nonetheless this had to be removed after about three days in place although even those three days seem to have made a fairly good improvement in regard to the overall appearance of the  wound bed. In fact is the first time that he's made any improvement from the standpoint of measurements in about six weeks. He continues to have no discomfort over the area of the wound itself which leads me to wonder why he was having the burning with the oasis when he does not even feel the actual debridement's themselves. I am somewhat perplexed by this. 04/19/18 on evaluation today patient's wound actually appears to be showing signs of epithelialization around the edge of the wound and in general actually appears to be doing better which is good news. He did have the same burning after about three days with applying the Endoform last week in the same fashion that I would generally apply a skin substitute. This seems to indicate that it's not the oasis to cause the problem but potentially the moisture buildup that just causes things to burn or there may be some other reaction with the skin prep or Steri-Strips. Nonetheless I'm not sure that is gonna be able to tolerate any skin substitute for a long period of time. The good news is the wound actually appears to be doing better today compared to last week and does seem to finally be making some progress. 04/26/18 on evaluation today patient actually appears to be doing rather well in regard to his ulcer in the right gluteal fold. He has been tolerating the dressing changes without complication which is good news. The Endoform does seem to be helping although he was a little bit more macerated this week. This seems to be an ongoing issue with fluid control at this point. Nonetheless I think we may be able to add something like Drawtex to help control the drainage. 05/03/18 on evaluation today patient appears to actually be doing better in regard to the overall appearance of his wound. He has been tolerating the dressing changes without complication. Fortunately there appears to be no evidence of infection at this time. I really feel like his wound  has shown signs as of today of turning around last week I thought so as well and definitely he could be seen in this week's overall appearance and measurements. In general I'm very pleased with the fact that he finally seems to be making a steady but sure progress. The patient likewise is very pleased. 05/17/18 on evaluation today patient appears to be doing more poorly unfortunately in regard to his ulcer. He has been tolerating the dressing changes without complication. With that being said he tells me that in the past couple of days he and his wife have noticed that we did not seem to be doing quite as well is getting dark near the center. Subsequently upon evaluation today the wound actually does appear to be doing worse compared to previous. He has been tolerating the dressing changes otherwise and he states that he is not been sitting up anymore than he was in the past from what he tells me. Still he has continued to work he states "I'm tired of dealing with this and if I  have to just go home and lay in the bed all the time that's what I'll do". Nonetheless I am concerned about the fact that this wound does appear to be deeper than what it was previous. 05/24/18 upon evaluation today patient actually presents after having been in the hospital due to what was presumed to be sepsis secondary to the wound infection. He had an elevated white blood cell count between 14 and 15. With that being said he does seem to be doing somewhat better now. His wound still is giving him some trouble nonetheless and he is obviously concerned about the fact likely talked about that this does seem to go more deeply than previously noted. I did review his wound culture which showed evidence of Staphylococcus aureus him and group B strep. Nonetheless he is on antibiotics, Levaquin, for this. Subsequently I did review his intake summary from the hospital as well. I also did look at the CT of the lumbar spine with  contrast that was performed which showed no bone destruction to suggest lumbar disguises/osteomyelitis or sacral osteomyelitis. There was no paraspinal abscess. Nonetheless it appears this may have been more of just a soft tissue infection at this point which is good news. He still is nonetheless concerned about the wound which again I think is completely reasonable considering everything he's been through recently. 05/31/18 on evaluation today on evaluation today patient actually appears to be showing signs of his wound be a little bit deeper than what I would like to see. Fortunately he does not show any signs of significant infection although his temperature was 99 today he states he's been checking this at home and has not been elevated. Nonetheless with the undermining that I'm seeing at this point I am becoming more concerned about the wound I do think that offloading is a key factor here that is preventing the speedy recovery at this point. There does not appear to be any evidence of again over infection noted. He's been using Santyl currently. [name], AZZARELLO ([id number]) 06/07/18 the patient presents today for follow-up evaluation regarding the left ulcer in the gluteal region. He has been tolerating the Wound VAC fairly well. He is obviously very frustrated with this he states that to mean is really getting in his way. There does not appear to be any evidence of infection at this time he does have a little bit of odor I do not necessarily associate this with infection just something that we sometimes notice with Wound VAC therapy. With that being said I can definitely catch a tone of discontentment overall in the patient's demeanor today. This when he was previously in the hospital an CT scan was done of the lumbar region which did not reveal any signs of osteomyelitis. With that being said the pelvis in particular was not evaluated distinctly which means he could still have some  osteonecrosis I. Nonetheless the Wound VAC was started on Thursday I do want to get this little bit more time before jumping to a CT scan of the pelvis although that is something that I might would recommend if were not see an improvement by that time. 06/14/18 on evaluation today patient actually appears to be doing about the same in regard to his right gluteal ulcer. Again he did have a CT scan of the lumbar spine unfortunately this did not include the pelvis. Nonetheless with the depth of the wound that I'm seeing today even despite the fact that I'm not seeing any evidence  of overt cellulitis I believe there's a good chance that we may be dealing with osteomyelitis somewhere in the right Ischial region. No fevers, chills, nausea, or vomiting noted at this time. 06/21/18 on evaluation today patient actually appears to be doing about the same with regard to his wound. The tunnel at 6 o'clock really does not appear to be any deeper although it is a little bit wider. I think at this point you may want to start packing this with white phone. Unfortunately I have not got approval for the CT scan of the pelvis as of yet due to the fact that Medicare apparently has been denied it due to the diagnosis codes not being appropriate according to Medicare for the test requested. With that being said the patient cannot have an MRI and therefore this is the only option that we have as far as testing is concerned. The patient has had infection and was on antibiotics and been added code for cellulitis of the bottom to see if this will be appropriate for getting the test approved. Nonetheless I'm concerned about the infection have been spread deeper into the Ischial region. 06/28/18 on evaluation today patient actually appears to be doing rather well all things considered in regard to the right gluteal ulcer. He has been tolerating the dressing changes without complication. With that being said the Wound VAC he  states does have to be replaced almost every day or at least reinforced unfortunately. Patient actually has his CT scan later this morning we should have the results by tomorrow. 07/05/18 on evaluation today patient presents for follow-up concerning his right Ischial ulcer. He did see the surgeon Dr. Lysle Pearl last week. They were actually very happy with him and felt like he spent a tremendous amount of time with them as far as discussing his situation was concerned. In the end Dr. Lysle Pearl did contact me as well and determine that he would not recommend any surgical intervention at this point as he felt like it would not be in the patient's best interest based on what he was seeing. He recommended a referral to infectious disease. Subsequently this is something that Dr. Ines Bloomer office is working on setting up for the patient. As far as evaluation today is concerned the patient's wound actually appears to be worse at this point. I am concerned about how things are progressing and specifically about infection. I do not feel like it's the deeper but the area of depth is definitely widening which does have me concerned. No fevers, chills, nausea, or vomiting noted at this time. I think that we do need initiate antibiotic therapy the patient has an allow allergy to amoxicillin/penicillin he states that he gets a rash since childhood. Nonetheless she's never had the issues with Catholics or cephalosporins in general but he is aware of. 07/27/18 on evaluation today patient presents following admission to the hospital on 07/09/18. He was subsequently discharged on 07/20/18. On 07/15/18 the patient underwent irrigation and debridement was soft tissue biopsy and bone biopsy as well as placement of a Wound VAC in the OR by Dr. Celine Ahr. During the hospital course the patient was placed on a Wound VAC and recommended follow up with surgery in three weeks actually with Dr. Delaine Lame who is infectious disease. The  patient was on vancomycin during the hospital course. 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 He also had a bone culture which revealed evidence of methicillin-resistant staph aureus. He is updated  CT scan 07/09/18 reveals that he had progression of the which was performed on wound to breakdown down to the trochanter where he actually had irregularities there as well suggestive of osteomyelitis. This was a change just since 9 December when we last performed a CT scan. Obviously this one had gone downhill quite significantly and rapidly. At this point upon evaluation I feel like in general the patient's wound seems to be doing fairly well all things considered upon my evaluation today. Obviously this is larger and deeper than what I previously evaluated but at the same time he seems to be making some progress as far as the appearance of the granulation tissue is concerned. I'm happy in that regard. No fevers, chills, nausea, or vomiting noted at this time. He is on IV vancomycin and Rocephin at the facility. He is currently in [facility]. 08/03/18 upon evaluation today patient's wound appears to be doing better in regard to the overall appearance at this point in time. Fortunately he's been tolerating the Wound VAC without complication and states that the facility has been taking JEDREK, DINOVO. ([id number]) excellent care of the wound site. Overall I see some Slough noted on the surface which I am going to attempt sharp debridement today of but nonetheless other than this I feel like he's making progress. 08/09/18 on evaluation today patient's wound appears to be doing much better compared to even last week's evaluation. Do believe that the Wound VAC is been of great benefit for him. He has been tolerating the dressing changes that is the Wound VAC without any complication and he has excellent granulation noted currently. There is no need for sharp  debridement at this point. 08/16/18 on evaluation today patient actually appears to be doing very well in regard to the wound in the right gluteal fold region. This is showing signs of progress and again appears to be very healthy which is excellent news. Fortunately there is no sign of active infection by way of odor or drainage at this point. Overall I'm very pleased with how things stand. He seems to be tolerating the Wound VAC without complication. 08/23/18 on evaluation today patient actually appears to be doing better in regard to his wound. He has been tolerating the Wound VAC without complication and in fact it has been collecting a significant amount of drainage which I think is good news especially considering how the wound appears. Fortunately there is no signs of infection at this time definitely nothing appears to be worse which is good news. He has not been started on the Bactrim and Flagyl that was recommended by Dr. Delaine Lame yet. I did actually contact her office this morning in order to check and see were things are that regard their gonna be calling me back. 08/30/18 on evaluation today patient actually appears to show signs of excellent improvement today compared to last evaluation. The undermining is getting much better the wound seems to be feeling quite nicely and I'm very pleased that the granulation in general. With that being said overall I feel like the patient has made excellent progress which is great news. No fevers, chills, nausea, or vomiting noted at this time. 09/06/18 on evaluation today patient actually appears to be doing rather well in regard to his right gluteal ulcer. This is showing signs of improvement in overall I'm very pleased with how things seem to be progressing. The patient likewise is please. Overall I see no evidence of infection he is about to complete  his oral antibiotic regimen which is the end of the antibiotics for him in just about three  days. 09/13/18 on evaluation today patient's right Ischial ulcer appears to be showing signs of continued improvement which is excellent news. He's been tolerating the dressing changes without complication. Fortunately there's no signs of infection and the wound that seems to be doing very well. 09/28/18 on evaluation today patient appears to be doing rather well in regard to his right Ischial ulcer. He's been tolerating the Wound VAC without complication he knows there's much less drainage than there used to be this obviously is not a bad thing in my pinion. There's no evidence of infection despite the fact is but nothing about it now for several weeks. 10/04/18 on evaluation today patient appears to be doing better in regard to his right Ischial wound. He has been tolerating the Wound VAC without complication and I do believe that the silver nitrate last week was beneficial for him. Fortunately overall there's no evidence of active infection at this time which is great news. No fevers, chills, nausea, or vomiting noted at this time. 10/11/18 on evaluation today patient actually appears to be doing rather well in regard to his Ischial ulcer. He's been tolerating the Wound VAC still without complication I feel like this is doing a good job. No fevers, chills, nausea, or vomiting noted at this time. 11/01/18 on evaluation today patient presents after having not been seen in our clinic for several weeks secondary to the fact that he was on evaluation today patient presents after having not been seen in our clinic for several weeks secondary to the fact that he was in a skilled nursing facility which was on lockdown currently due to the covert 19 national emergency. Subsequently he was discharged from the facility on this past Friday and subsequently made an appointment to come in to see yesterday. Fortunately there's no signs of active infection at this time which is good news and overall he does seem  to have made progress since I last saw. Overall I feel like things are progressing quite nicely. The patient is having no pain. 11/08/18 on evaluation today patient appears to be doing okay in regard to his right gluteal ulcer. He has been utilizing a Wound VAC home health this changing this at this point since he's home from the skilled nursing facility. Fortunately there's no signs of obvious active infection at this time. Unfortunately though there's no obvious active infection he is having some maceration and his wife states that when the sheets of the Wound VAC office on Sunday when it broke seal that he ended up having significant issues with some smell as well there concerned about the possibility of infection. Fortunately there's No fevers, chills, nausea, or vomiting noted at this time. TOMMASO, CAVITT ([id number]) 11/15/18 on evaluation today patient actually appears to be doing well in regard to his right gluteal ulcer. He has been tolerating the dressing changes without complication. Specifically the Wound VAC has been utilized up to this point. Fortunately there's no signs of infection and overall I feel like he has made progress even since last week when I last saw him. I'm actually fairly happy with the overall appearance although he does seem to have somewhat of a hyper granular overgrowth in the central portion of the wound which I think may require some sharp debridement to try flatness out possibly utilizing chemical cauterization following. 11/23/18 on evaluation today patient actually appears to be doing  very well in regard to his sacral ulcer. He seems to be showing signs of improvement with good granulation. With that being said he still has the small area of hyper granulation right in the central portion of the wound which I'm gonna likely utilize silver nitrate on today. Subsequently he also keeps having a leak at the 6 o'clock location which is unfortunate we may be able  to help out with some suggestions to try to prevent this going forward. Fortunately there's no signs of active infection at this time. 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 He's been tolerating the dressing changes without complication. Fortunately there's no signs of active infection at this time. I've been rather pleased with how things have progressed there still some evidence of pressure getting to the area with some redness right around the immediate wound opening. Nonetheless other than this I'm not seeing any significant complications or issues the wound is somewhat hyper granular. Upon discussing with the patient and his wife today I'm not sure that the wound is being packed to the base with the foam at this point. And if it's not been packed fully that may be part of the reason why is not seen as much improvement as far as the granulation from the base out. Again we do not want pack too tightly but we need some of the firm to get to the base of the wound. I discussed this with patient and his wife today. 12/06/18 on evaluation today patient appears to be doing well in regard to his right gluteal pressure ulcer. He's been tolerating the dressing changes without complication. Fortunately there's no signs of active infection. He still has some hyper granular tissue and I do think it would be appropriate to continue with the chemical cauterization as of today. 12/16/18 on evaluation today patient actually appears to be doing okay in regard to his right gluteal ulcer. He is been tolerating the dressing changes without complication including the Wound VAC. Overall I feel like nothing seems to be worsening I do feel like that the hyper granulation buds in the central portion of the wound have improved to some degree with the silver nitrate. We will have to see how things continue to  progress. 12/20/18 on evaluation today patient actually appears to be doing much worse in my pinion even compared to last week's evaluation. Unfortunately as opposed to showing any signs of improvement the areas of hyper granular tissue in the central portion of the wound seem to be getting worse. Subsequently the wound bed itself also seems to be getting deeper even compared to last week which is both unusual as well as concerning since prior he had been shown signs of improvement. Nonetheless I think that the issue could be that he's actually having some difficulty in issues with a deeper infection. There's no external signs of infection but nonetheless I am more worried about the internal, osteomyelitis, that could be restarting. He has not been on antibiotics for some time at this point. I think that it may be a good idea to go ahead and started back on an antibiotic therapy while we wait to see what the testing shows. 12/27/18 on evaluation today patient presents for follow-up concerning his left gluteal fold wound. Fortunately he appears to be doing well today. I did review the CT scan which was negative for any signs of osteomyelitis or  acute abnormality this is excellent news. Overall I feel like the surface of the wound bed appears to be doing significantly better today compared to previously noted findings. There does not appear any signs of infection nor does he have any pain at this time. 01/03/19 on evaluation today patient actually appears to be doing quite well in regard to his ulcer. Post debridement last week he really did not have too much bleeding which is good news. Fortunately today this seems to be doing some better but we still has some of the hyper granular tissue noted in the base of the wound which is gonna require sharp debridement today as well. Overall I'm pleased with how things seem to be progressing since we switched away from the Wound VAC I think he is making some  progress. 01/10/19 on evaluation today patient appears to be doing better in regard to his right gluteal fold ulcer. He has been tolerating the dressing changes without complication. The debridement to seem to be helping with current away some of the poor hyper granular tissue bugs throughout the region of his gluteal fold wound. He's been tolerating the dressing changes otherwise without complication which is great news. No fevers, chills, nausea, or vomiting noted at this time. 01/17/19 on evaluation today patient actually appears to be doing excellent in regard to his wound. He's been tolerating the dressing changes without complication. Fortunately there is no signs of active infection at this time which is great news. No fevers, chills, nausea, or vomiting noted at this time. KAHLEL, PEAKE ([id number]) 01/24/19 on evaluation today patient actually appears to be doing quite well with regard to his ulcer. He has been tolerating the dressing changes without complication. Fortunately there's no signs of active infection at this time. Overall been very pleased with the progress that he seems to be making currently. Patient History Information obtained from Patient. Family History Hypertension - Father, Stroke - Mother, No family history of Cancer, Diabetes, Heart Disease, Kidney Disease, Lung Disease, Seizures, Thyroid Problems, Tuberculosis. Social History Never smoker, Marital Status - Married, Alcohol Use - Never, Drug Use - No History, Caffeine Use - Daily. Medical History Eyes Patient has history of Cataracts - both removed Denies history of Glaucoma, Optic Neuritis Ear/Nose/Mouth/Throat Denies history of Chronic sinus problems/congestion, Middle ear problems Hematologic/Lymphatic Denies history of Anemia, Hemophilia, Human Immunodeficiency Virus, Lymphedema Respiratory Denies history of Aspiration, Asthma, Chronic Obstructive Pulmonary Disease (COPD), Pneumothorax, Sleep  Apnea, Tuberculosis Cardiovascular Patient has history of Hypertension - takes medication Denies history of Angina, Arrhythmia, Congestive Heart Failure, Coronary Artery Disease, Deep Vein Thrombosis, Hypotension, Myocardial Infarction, Peripheral Arterial Disease, Peripheral Venous Disease, Phlebitis, Vasculitis Gastrointestinal Denies history of Cirrhosis , Colitis, Crohn s, Hepatitis A, Hepatitis B, Hepatitis C Endocrine Denies history of Type I Diabetes, Type II Diabetes Genitourinary Denies history of End Stage Renal Disease Immunological Denies history of Lupus Erythematosus, Raynaud s, Scleroderma Integumentary (Skin) Denies history of History of Burn, History of pressure wounds Musculoskeletal Denies history of Gout, Rheumatoid Arthritis, Osteoarthritis, Osteomyelitis Neurologic Patient has history of Paraplegia - waist down Denies history of Dementia, Neuropathy, Quadriplegia, Seizure Disorder Oncologic Denies history of Received Chemotherapy, Received Radiation Psychiatric Denies history of Anorexia/bulimia, Confinement Anxiety Medical And Surgical History Notes Oncologic Prostate cancer- currently treated with horomone therapy Review of Systems (ROS) Constitutional Symptoms (General Health) Denies complaints or symptoms of Fatigue, Fever, Chills, Marked Weight Change. Respiratory TERRI, MALERBA. ([id number]) Denies complaints or symptoms of Chronic or frequent coughs, Shortness of Breath.  Cardiovascular Denies complaints or symptoms of Chest pain, LE edema. Psychiatric Denies complaints or symptoms of Anxiety, Claustrophobia. Objective Constitutional Well-nourished and well-hydrated in no acute distress. Vitals Time Taken: 8:14 AM, Height: 73 in, Weight: 210 lbs, BMI: 27.7, Temperature: 98.4 F, Pulse: 73 bpm, Respiratory Rate: 16 breaths/min, Blood Pressure: 131/84 mmHg. Respiratory normal breathing without difficulty. Psychiatric this patient is able to  make decisions and demonstrates good insight into disease process. Alert and Oriented x 3. pleasant and cooperative. General Notes: At this point sharp debridement was performed to clear away [facility] and aquatic debris from the base of the wound fortunately he has no significant hyper granular lesions at this point and wound bed seems to be doing much better which is great news. Very pleased in this regard. Integumentary (Hair, Skin) Wound #1 status is Open. Original cause of wound was Pressure Injury. The wound is located on the Right Gluteal fold. The wound measures 2.7cm length x 2.7cm width x 2.2cm depth; 5.726cm^2 area and 12.596cm^3 volume. There is muscle and Fat Layer (Subcutaneous Tissue) Exposed exposed. There is no undermining noted, however, there is tunneling at 12:00 with a maximum distance of 2.2cm. There is a large amount of serous drainage noted. The wound margin is epibole. There is large (67-100%) red, pink, hyper - granulation within the wound bed. There is a small (1-33%) amount of necrotic tissue within the wound bed including Adherent Slough. 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 ([id number]) Procedures Wound #1 Pre-procedure diagnosis of Wound #1 is a Pressure Ulcer located on the Right Gluteal fold . There was a Excisional Skin/Subcutaneous Tissue Debridement with a total area of 1 sq cm performed by [name], HOYT E., [name]. With the following instrument(s): Curette to remove Viable and Non-Viable tissue/material. Material removed includes Subcutaneous Tissue and Slough and after achieving pain control using Lidocaine. No specimens were taken. A time out was conducted at 08:34, prior to the start of the procedure. A Moderate amount of bleeding was controlled  with Pressure. The procedure was tolerated well. Post Debridement Measurements: 2.7cm length x 2.7cm width x 2.2cm depth; 12.596cm^3 volume. Post debridement Stage noted as Category/Stage IV. Character of Wound/Ulcer Post Debridement is stable. Post procedure Diagnosis Wound #1: Same as Pre-Procedure Plan Wound Cleansing: Wound #1 Right Gluteal fold: Cleanse wound with mild soap and water Anesthetic (add to Medication List): Wound #1 Right Gluteal fold: Topical Lidocaine 4% cream applied to wound bed prior to debridement (In Clinic Only). Skin Barriers/Peri-Wound Care: Wound #1 Right Gluteal fold: Other: - Apply zinc oxide to peri-wound area. Primary Wound Dressing: Wound #1 Right Gluteal fold: Silver Alginate - pack lightly into wound Secondary Dressing: Wound #1 Right Gluteal fold: ABD pad - Tegaderm to secure Dressing Change Frequency: Wound #1 Right Gluteal fold: Change dressing every day. Follow-up Appointments: Wound #1 Right Gluteal fold: Return Appointment in 1 week. Off-Loading: Wound #1 Right Gluteal fold: Turn and reposition every 2 hours Home Health: Wound #1 Right Gluteal fold: [place] Nurse may visit PRN to address patient s wound care needs. FACE TO FACE ENCOUNTER: MEDICARE and MEDICAID PATIENTS: I certify that this patient is under my care and that I had a face-to-face encounter that meets the physician face-to-face encounter requirements with this patient on this date. The encounter with the patient was in whole or  in part for the following MEDICAL CONDITION: (primary reason for Home Healthcare) MEDICAL NECESSITY: I certify, that based on my findings, NURSING services are a medically necessary home health service. HOME BOUND STATUS: I certify that my clinical findings support that this patient is homebound (i.e., Due to illness or injury, pt requires aid of supportive devices such as crutches, cane, wheelchairs, walkers, the use  of special transportation or the assistance of another person to leave their place of residence. There is a normal inability to leave the JISHNU, JENNIGES. ([id number]) home and doing so requires considerable and taxing effort. Other absences are for medical reasons / religious services and are infrequent or of short duration when for other reasons). If current dressing causes regression in wound condition, may D/C ordered dressing product/s and apply Normal Saline Moist Dressing daily until next [place] / Other MD appointment. Dix of regression in wound condition at [phone]. Please direct any NON-WOUND related issues/requests for orders to patient's Primary Care Physician I do believe this time that the patient may benefit from going ahead and switching to a silver alginate dressing both with regard to the maceration that is around the edge of the wound as well as with regard to getting the wound to heal in more appropriately and quickly. He is in agreement with this plan. We will subsequently see were things stand at follow-up. If anything changes or worsens in the meantime he will contact the office and let me know. Please see above for specific wound care orders. We will see patient for re-evaluation in 1 week(s) here in the clinic. If anything worsens or changes patient will contact our office for additional recommendations. Electronic Signature(s) Signed: 01/24/2019 9:34:43 AM By: Worthy Keeler [name] Entered By: Worthy Keeler on 01/24/2019 09:32:28 KHALFANI, WEIDEMAN ([id number]) -------------------------------------------------------------------------------- ROS/PFSH Details Patient Name: [name] Date of Service: 01/24/2019 8:00 AM Medical Record Number: [id number] Patient Account Number: [id number] Date of Birth/Sex: Dec 08, 1950 (68 y.o. M) Treating RN: Harold Barban Primary Care Provider: [name] Other  Clinician: Referring Provider: [name] Treating Provider/Extender: [name], HOYT Weeks in Treatment: 60 Information Obtained From Patient Constitutional Symptoms (General Health) Complaints and Symptoms: Negative for: Fatigue; Fever; Chills; Marked Weight Change Respiratory Complaints and Symptoms: Negative for: Chronic or frequent coughs; Shortness of Breath Medical History: Negative for: Aspiration; Asthma; Chronic Obstructive Pulmonary Disease (COPD); Pneumothorax; Sleep Apnea; Tuberculosis Cardiovascular Complaints and Symptoms: Negative for: Chest pain; LE edema Medical History: Positive for: Hypertension - takes medication Negative for: Angina; Arrhythmia; Congestive Heart Failure; Coronary Artery Disease; Deep Vein Thrombosis; Hypotension; Myocardial Infarction; Peripheral Arterial Disease; Peripheral Venous Disease; Phlebitis; Vasculitis Psychiatric Complaints and Symptoms: Negative for: Anxiety; Claustrophobia Medical History: Negative for: Anorexia/bulimia; Confinement Anxiety Eyes Medical History: Positive for: Cataracts - both removed Negative for: Glaucoma; Optic Neuritis Ear/Nose/Mouth/Throat Medical History: Negative for: Chronic sinus problems/congestion; Middle ear problems Hematologic/Lymphatic Medical History: Negative for: Anemia; Hemophilia; Human Immunodeficiency Virus; Lymphedema MAVEN, VARELAS. ([id number]) Gastrointestinal Medical History: Negative for: Cirrhosis ; Colitis; Crohnos; Hepatitis A; Hepatitis B; Hepatitis C Endocrine Medical History: Negative for: Type I Diabetes; Type II Diabetes Genitourinary Medical History: Negative for: End Stage Renal Disease Immunological Medical History: Negative for: Lupus Erythematosus; Raynaudos; Scleroderma Integumentary (Skin) Medical History: Negative for: History of Burn; History of pressure wounds Musculoskeletal Medical History: Negative for: Gout; Rheumatoid Arthritis;  Osteoarthritis; Osteomyelitis Neurologic Medical History: Positive for: Paraplegia - waist down Negative for: Dementia; Neuropathy; Quadriplegia; Seizure Disorder Oncologic Medical History:  Negative for: Received Chemotherapy; Received Radiation Past Medical History Notes: Prostate cancer- currently treated with horomone therapy HBO Extended History Items Eyes: Cataracts Immunizations Pneumococcal Vaccine: Received Pneumococcal Vaccination: No Implantable Devices No devices added Family and Social History Cancer: No; Diabetes: No; Heart Disease: No; Hypertension: Yes - Father; Kidney Disease: No; Lung Disease: No; Seizures: No; Stroke: Yes - Mother; Thyroid Problems: No; Tuberculosis: No; Never smoker; Marital Status - Married; Alcohol Use: Never; Drug Use: No History; Caffeine Use: Daily GILLIAN, KLUEVER ([id number]) Physician Affirmation I have reviewed and agree with the above information. Electronic Signature(s) Signed: 01/24/2019 9:34:43 AM By: Worthy Keeler [name] Signed: 01/24/2019 4:36:57 PM By: Harold Barban Entered By: Worthy Keeler on 01/24/2019 09:31:07 DRAVEN, NATTER ([id number]) -------------------------------------------------------------------------------- SuperBill Details Patient Name: [name] Date of Service: 01/24/2019 Medical Record Number: [id number] Patient Account Number: [id number] Date of Birth/Sex: [birth year] (68 y.o. 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

## 2019-01-31 ENCOUNTER — Encounter: Payer: Medicare Other | Admitting: Internal Medicine

## 2019-01-31 ENCOUNTER — Other Ambulatory Visit: Payer: Self-pay

## 2019-01-31 DIAGNOSIS — L89314 Pressure ulcer of right buttock, stage 4: Secondary | ICD-10-CM | POA: Diagnosis not present

## 2019-02-01 NOTE — Progress Notes (Signed)
Brett, [name] ([id number]) Visit Report for 01/31/2019 Arrival Information Details Patient Name: [name], [name] Date of Service: 01/31/2019 8:00 AM Medical Record Number: [id number] Patient Account Number: [id number] Date of Birth/Sex: 05/08/1951 (68 y.o. M) Treating RN: Montey Hora Primary Care Amedee Cerrone: [name] Other Clinician: Referring Muhsin Doris: [name] Treating Stefanie Hodgens/Extender: Beverly Gust in Treatment: 38 Visit Information History Since Last Visit Added or deleted any medications: No Patient Arrived: Wheel Chair Any new allergies or adverse reactions: No Arrival Time: 08:07 Had a fall or experienced change in No Accompanied By: wife activities of daily living that may affect Transfer Assistance: None risk of falls: Patient Identification Verified: Yes Signs or symptoms of abuse/neglect since last visito No Secondary Verification Process Completed: Yes Hospitalized since last visit: No Patient Requires Transmission-Based No Implantable device outside of the clinic excluding No Precautions: cellular tissue based products placed in the center Patient Has Alerts: Yes since last visit: Patient Alerts: NOT Has Dressing in Place as Prescribed: Yes Diabetic Pain Present Now: No Electronic Signature(s) Signed: 01/31/2019 4:55:00 PM By: Montey Hora Entered By: Montey Hora on 01/31/2019 08:08:09 [name] ([id number]) -------------------------------------------------------------------------------- Clinic Level of Care Assessment Details Patient Name: [name] Date of Service: 01/31/2019 8:00 AM Medical Record Number: [id number] Patient Account Number: [id number] Date of Birth/Sex: January 15, 1951 (68 y.o. M) Treating RN: Harold Barban Primary Care Kealy Lewter: [name] Other Clinician: Referring Taiyana Kissler: [name] Treating Janthony Holleman/Extender: Beverly Gust in Treatment: 49 Clinic Level of  Care Assessment Items TOOL 4 Quantity Score []  - Use when only an EandM is performed on FOLLOW-UP visit 0 ASSESSMENTS - Nursing Assessment / Reassessment X - Reassessment of Co-morbidities (includes updates in patient status) 1 10 X- 1 5 Reassessment of Adherence to Treatment Plan ASSESSMENTS - Wound and Skin Assessment / Reassessment X - Simple Wound Assessment / Reassessment - one wound 1 5 []  - 0 Complex Wound Assessment / Reassessment - multiple wounds []  - 0 Dermatologic / Skin Assessment (not related to wound area) ASSESSMENTS - Focused Assessment []  - Circumferential Edema Measurements - multi extremities 0 []  - 0 Nutritional Assessment / Counseling / Intervention []  - 0 Lower Extremity Assessment (monofilament, tuning fork, pulses) []  - 0 Peripheral Arterial Disease Assessment (using hand held doppler) ASSESSMENTS - Ostomy and/or Continence Assessment and Care []  - Incontinence Assessment and Management 0 []  - 0 Ostomy Care Assessment and Management (repouching, etc.) PROCESS - Coordination of Care X - Simple Patient / Family Education for ongoing care 1 15 []  - 0 Complex (extensive) Patient / Family Education for ongoing care []  - 0 Staff obtains Programmer, systems, Records, Test Results / Process Orders X- 1 10 Staff telephones HHA, Nursing Homes / Clarify orders / etc []  - 0 Routine Transfer to another Facility (non-emergent condition) []  - 0 Routine Hospital Admission (non-emergent condition) []  - 0 New Admissions / Biomedical engineer / Ordering NPWT, Apligraf, etc. []  - 0 Emergency Hospital Admission (emergent condition) X- 1 10 Simple Discharge Coordination MIKLE, STERNBERG. ([id number]) []  - 0 Complex (extensive) Discharge Coordination PROCESS - Special Needs []  - Pediatric / Minor Patient Management 0 []  - 0 Isolation Patient Management []  - 0 Hearing / Language / Visual special needs []  - 0 Assessment of Community assistance (transportation, D/C  planning, etc.) []  - 0 Additional assistance / Altered mentation []  - 0 Support Surface(s) Assessment (bed, cushion, seat, etc.) INTERVENTIONS - Wound Cleansing / Measurement X - Simple Wound Cleansing - one wound 1 5 []  -  0 Complex Wound Cleansing - multiple wounds X- 1 5 Wound Imaging (photographs - any number of wounds) []  - 0 Wound Tracing (instead of photographs) X- 1 5 Simple Wound Measurement - one wound []  - 0 Complex Wound Measurement - multiple wounds INTERVENTIONS - Wound Dressings X - Small Wound Dressing one or multiple wounds 1 10 []  - 0 Medium Wound Dressing one or multiple wounds []  - 0 Large Wound Dressing one or multiple wounds []  - 0 Application of Medications - topical []  - 0 Application of Medications - injection INTERVENTIONS - Miscellaneous []  - External ear exam 0 []  - 0 Specimen Collection (cultures, biopsies, blood, body fluids, etc.) []  - 0 Specimen(s) / Culture(s) sent or taken to Lab for analysis []  - 0 Patient Transfer (multiple staff / Civil Service fast streamer / Similar devices) []  - 0 Simple Staple / Suture removal (25 or less) []  - 0 Complex Staple / Suture removal (26 or more) []  - 0 Hypo / Hyperglycemic Management (close monitor of Blood Glucose) []  - 0 Ankle / Brachial Index (ABI) - do not check if billed separately X- 1 5 Vital Signs Finlay, Cutter J. ([id number]) Has the patient been seen at the hospital within the last three years: Yes Total Score: 85 Level Of Care: New/Established - Level 3 Electronic Signature(s) Signed: 01/31/2019 5:21:59 PM By: Harold Barban Entered By: Harold Barban on 01/31/2019 08:44:47 [name] ([id number]) -------------------------------------------------------------------------------- Encounter Discharge Information Details Patient Name: [name] Date of Service: 01/31/2019 8:00 AM Medical Record Number: [id number] Patient Account Number: [id number] Date of Birth/Sex: 08/17/1950 (68  y.o. M) Treating RN: Army Melia Primary Care Sire Poet: [name] Other Clinician: Referring Blessing Ozga: [name] Treating Malijah Lietz/Extender: Beverly Gust in Treatment: 37 Encounter Discharge Information Items Discharge Condition: Stable Ambulatory Status: Wheelchair Discharge Destination: Home Transportation: Private Auto Accompanied By: wife Schedule Follow-up Appointment: Yes Clinical Summary of Care: Electronic Signature(s) Signed: 01/31/2019 8:57:14 AM By: Army Melia Entered By: Army Melia on 01/31/2019 08:57:13 RASHIED, CORALLO ([id number]) -------------------------------------------------------------------------------- Lower Extremity Assessment Details Patient Name: [name] Date of Service: 01/31/2019 8:00 AM Medical Record Number: [id number] Patient Account Number: [id number] Date of Birth/Sex: [birth year] (68 y.o. M) Treating RN: Montey Hora Primary Care Jullian Previti: [name] Other Clinician: Referring Baylee Mccorkel: [name] Treating Kadiatou Oplinger/Extender: Beverly Gust in Treatment: 52 Electronic Signature(s) Signed: 01/31/2019 4:55:00 PM By: Montey Hora Entered By: Montey Hora on 01/31/2019 08:14:25 YAMIN, SWINGLER ([id number]) -------------------------------------------------------------------------------- Multi Wound Chart Details Patient Name: [name] Date of Service: 01/31/2019 8:00 AM Medical Record Number: [id number] Patient Account Number: [id number] Date of Birth/Sex: 03-07-1951 (68 y.o. M) Treating RN: Harold Barban Primary Care Aliany Fiorenza: [name] Other Clinician: Referring Haward Pope: [name] Treating Annalissa Murphey/Extender: Beverly Gust in Treatment: 51 Vital Signs Height(in): 73 Pulse(bpm): 38 Weight(lbs): 210 Blood Pressure(mmHg): 145/79 Body Mass Index(BMI): 28 Temperature(F): 98.0 Respiratory Rate 16 (breaths/min): Photos:  [N/A:N/A] Wound Location: Right Gluteal fold N/A N/A Wounding Event: Pressure Injury N/A N/A Primary Etiology: Pressure Ulcer N/A N/A Comorbid History: Cataracts, Hypertension, N/A N/A Paraplegia Date Acquired: 11/02/2017 N/A N/A Weeks of Treatment: 4 N/A N/A Wound Status: Open N/A N/A Measurements L x W x D 2.7x2.8x4 N/A N/A (cm) Area (cm) : 5.938 N/A N/A Volume (cm) : 23.75 N/A N/A % Reduction in Area: 40.90% N/A N/A % Reduction in Volume: -2263.20% N/A N/A Classification: Category/Stage IV N/A N/A Exudate Amount: Large N/A N/A Exudate Type: Serous N/A N/A Exudate Color: amber N/A N/A Wound Margin: Epibole N/A N/A  Granulation Amount: Large (67-100%) N/A N/A Granulation Quality: Red, Pink, Hyper-granulation N/A N/A Necrotic Amount: Small (1-33%) N/A N/A Exposed Structures: Fat Layer (Subcutaneous N/A N/A Tissue) Exposed: Yes Muscle: Yes Fascia: No Tendon: No Joint: No Bone: No VALOR, QUAINTANCE. ([id number]) Epithelialization: None N/A N/A Treatment Notes Electronic Signature(s) Signed: 01/31/2019 8:25:11 AM By: Harold Barban Entered By: Harold Barban on 01/31/2019 08:25:10 [name] ([id number]) -------------------------------------------------------------------------------- Rea Details Patient Name: [name] Date of Service: 01/31/2019 8:00 AM Medical Record Number: [id number] Patient Account Number: [id number] Date of Birth/Sex: 24-Sep-1950 (68 y.o. M) Treating RN: Harold Barban Primary Care Adianna Darwin: [name] Other Clinician: Referring Saharsh Sterling: [name] Treating Xayla Puzio/Extender: Beverly Gust in Treatment: 15 Active Inactive Orientation to the Wound Care Program Nursing Diagnoses: Knowledge deficit related to the wound healing center program Goals: Patient/caregiver will verbalize understanding of the [facility] Program Date Initiated: 11/30/2017 Target Resolution  Date: 12/21/2017 Goal Status: Active Interventions: Provide education on orientation to the wound center Notes: Pressure Nursing Diagnoses: Knowledge deficit related to management of pressures ulcers Goals: Patient/caregiver will verbalize understanding of pressure ulcer management Date Initiated: 05/17/2018 Target Resolution Date: 05/28/2018 Goal Status: Active Interventions: Assess: immobility, friction, shearing, incontinence upon admission and as needed Notes: Wound/Skin Impairment Nursing Diagnoses: Impaired tissue integrity Goals: Patient/caregiver will verbalize understanding of skin care regimen Date Initiated: 11/30/2017 Target Resolution Date: 12/21/2017 Goal Status: Active Ulcer/skin breakdown will have a volume reduction of 30% by week 4 Date Initiated: 11/30/2017 Target Resolution Date: 12/21/2017 Goal Status: Active DAESHAWN, REDMANN ([id number]) Interventions: Assess patient/caregiver ability to obtain necessary supplies Assess patient/caregiver ability to perform ulcer/skin care regimen upon admission and as needed Assess ulceration(s) every visit Treatment Activities: Skin care regimen initiated : 11/30/2017 Notes: Electronic Signature(s) Signed: 01/31/2019 8:17:23 AM By: Harold Barban Entered By: Harold Barban on 01/31/2019 08:17:22 [name] ([id number]) -------------------------------------------------------------------------------- Pain Assessment Details Patient Name: [name] Date of Service: 01/31/2019 8:00 AM Medical Record Number: [id number] Patient Account Number: [id number] Date of Birth/Sex: December 23, 1950 (68 y.o. M) Treating RN: Montey Hora Primary Care Vyom Brass: [name] Other Clinician: Referring Voncile Schwarz: [name] Treating Maleeha Halls/Extender: Beverly Gust in Treatment: 61 Active Problems Location of Pain Severity and Description of Pain Patient Has Paino Yes Site Locations Pain  Location: Pain in Ulcers With Dressing Change: No Duration of the Pain. Constant / Intermittento Intermittent Pain Management and Medication Current Pain Management: Notes when sitting Electronic Signature(s) Signed: 01/31/2019 4:55:00 PM By: Montey Hora Entered By: Montey Hora on 01/31/2019 08:08:30 [name], [name] ([id number]) -------------------------------------------------------------------------------- Patient/Caregiver Education Details Patient Name: [name] Date of Service: 01/31/2019 8:00 AM Medical Record Number: [id number] Patient Account Number: [id number] Date of Birth/Gender: 01/10/1951 (68 y.o. M) Treating RN: Harold Barban Primary Care Physician: [name] Other Clinician: Referring Physician: [name] Treating Physician/Extender: Beverly Gust in Treatment: 66 Education Assessment Education Provided To: Patient Education Topics Provided Pressure: Handouts: Pressure Ulcers: Care and Offloading Methods: Demonstration, Explain/Verbal Responses: State content correctly Wound/Skin Impairment: Handouts: Caring for Your Ulcer Methods: Demonstration, Explain/Verbal Responses: State content correctly Electronic Signature(s) Signed: 01/31/2019 5:21:59 PM By: Harold Barban Entered By: Harold Barban on 01/31/2019 08:18:19 [name] ([id number]) -------------------------------------------------------------------------------- Wound Assessment Details Patient Name: [name] Date of Service: 01/31/2019 8:00 AM Medical Record Number: [id number] Patient Account Number: [id number] Date of Birth/Sex: Sep 19, 1950 (68 y.o. M) Treating RN: Montey Hora Primary Care Dillian Feig: [name] Other Clinician: Referring Skai Lickteig: [name] Treating Curtisha Bendix/Extender: Beverly Gust in  Treatment: 61 Wound Status Wound Number: 1 Primary Etiology: Pressure Ulcer Wound Location: Right Gluteal  fold Wound Status: Open Wounding Event: Pressure Injury Comorbid History: Cataracts, Hypertension, Paraplegia Date Acquired: 11/02/2017 Weeks Of Treatment: 61 Clustered Wound: No Photos Wound Measurements Length: (cm) 2.7 Width: (cm) 2.8 Depth: (cm) 4 Area: (cm) 5.938 Volume: (cm) 23.75 % Reduction in Area: 40.9% % Reduction in Volume: -2263.2% Epithelialization: None Tunneling: No Undermining: No Wound Description Classification: Category/Stage IV Foul Odo Wound Margin: Epibole Slough/F Exudate Amount: Large Exudate Type: Serous Exudate Color: amber r After Cleansing: No ibrino Yes Wound Bed Granulation Amount: Large (67-100%) Exposed Structure Granulation Quality: Red, Pink, Hyper-granulation Fascia Exposed: No Necrotic Amount: Small (1-33%) Fat Layer (Subcutaneous Tissue) Exposed: Yes Necrotic Quality: Adherent Slough Tendon Exposed: No Muscle Exposed: Yes Necrosis of Muscle: No Joint Exposed: No Bone Exposed: No Treatment Notes BLANCHE, GALLIEN. ([id number]) Wound #1 (Right Gluteal fold) Notes silver cell, ABD, Tegaderm Electronic Signature(s) Signed: 01/31/2019 4:55:00 PM By: Montey Hora Entered By: Montey Hora on 01/31/2019 08:21:20 [name] ([id number]) -------------------------------------------------------------------------------- Vitals Details Patient Name: [name] Date of Service: 01/31/2019 8:00 AM Medical Record Number: [id number] Patient Account Number: [id number] Date of Birth/Sex: 1951-03-29 (68 y.o. M) Treating RN: Montey Hora Primary Care Dontrey Snellgrove: [name] Other Clinician: Referring Ronnica Dreese: [name] Treating Latiqua Daloia/Extender: Beverly Gust in Treatment: 31 Vital Signs Time Taken: 08:08 Temperature (F): 98.0 Height (in): 73 Pulse (bpm): 73 Weight (lbs): 210 Respiratory Rate (breaths/min): 16 Body Mass Index (BMI): 27.7 Blood Pressure (mmHg): 145/79 Reference Range: 80  - 120 mg / dl Electronic Signature(s) Signed: 01/31/2019 4:55:00 PM By: Montey Hora Entered By: Montey Hora on 01/31/2019 08:09:39

## 2019-02-01 NOTE — Progress Notes (Signed)
GAR, GLANCE ([id number]) Visit Report for 01/31/2019 HPI Details Patient Name: [name], [name] Date of Service: 01/31/2019 8:00 AM Medical Record Number: [id number] Patient Account Number: [id number] Date of Birth/Sex: [birth year] (68 y.o. 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 He states that there is no history of diabetes that he is aware of. He does have issues with his prostate and is currently receiving treatment for this by way of oral medication. With that being said I do not have a lot of details in that regard. Nonetheless the patient presents today as a result of having been referred to [place] by another provider initially home health was set to come out and take care of his wound although due to the fact that he apparently drives he's not able to receive home health. His wife is therefore trying to help take care of this wound within although they have been struggling with what exactly to do at this point. She states that she can do some things but she is definitely not a nurse and does have some issues with looking at blood. The good news is the wound does not appear to be too deep and is fairly superficial at this point. There is no slough noted there is some nonviable skin noted around the surface of the wound and the perimeter at this point. The central portion of the wound appears to be very good with a dermal layer noted this does not appear to be again deep enough to extend it to subcutaneous tissue at this point. Overall  the patient for a paraplegic seems to be functioning fairly well he does have both a spinal cord stimulator as well is the intrathecal pump. In the pump he has Dilaudid and baclofen. 12/07/17 on evaluation today patient presents for follow-up concerning his ongoing lower back thigh ulcer on the right. He states that he did not get the supplies ordered and therefore has not really been able to perform the dressing changes as directed exactly. His wife was able to get some Boarder Foam Dressing's from the drugstore and subsequently has been using hydrogel which did help to a degree in the wound does appear to be able smaller. There is actually more drainage this week noted than previous. 12/21/17 on evaluation today patient appears to be doing rather well in regard to his right gluteal ulcer. He has been tolerating the dressing changes without complication. There does not appear to be any evidence of infection at this point in time. Overall the wound does seem to be making some progress as far as the edges are concerned there's not as much in the way of overlapping of the external wound edges and he has a good epithelium to wound bed border for the most part. This however is not true right at the 12 o'clock location over the span of a little over a centimeters which actually will require debridement today to clean this away and hopefully allow it to continue to heal more appropriately. 12/28/17 on evaluation today patient appears to be doing rather well in regard to his ulcer in the left  gluteal region. He's been tolerating the dressing changes without complication. Apparently he has had some difficulty getting his dressing material. Apparently there's been some confusion with ordering we're gonna check into this. Nonetheless overall he's been showing signs of improvement which is good news. Debridement is not required today. 01/04/18 on evaluation today patient presents for follow-up concerning his right  gluteal ulcer. He has been tolerating the dressing changes fairly well. On inspection today it appears he may actually have some maceration them concerned about the fact that he may be developing too much moisture in and around the wound bed which can cause delay in healing. With that being said he unfortunately really has not showed significant signs of improvement since last week's evaluation in fact this may even be just the little bit/slightly larger. Nonetheless he's been having a lot of discomfort I'm not sure this is even related to the wound as he has no pain when I'm to breeding or otherwise cleaning the wound during evaluation today. Nonetheless this is something that we did recommend he talked to his pain specialist concerning. 01/11/18 on evaluation today patient appears to be doing better in regard to his ulceration. He has been tolerating the dressing changes without complication. With that being said overall there's no evidence of infection which is good news. The only thing [name], [name]. ([id number]) is he did receive the hatch affair blue classic versus the ready nonetheless I feel like this is perfectly fine and appears to have done well for him over the past week. 01/25/18 on evaluation today patient's wound actually appears to be a little bit larger than during the last evaluation. The good news is the majority of the wound edges actually appear to be fairly firmly attached to the wound bed unfortunately again we're not really making progress in regard to the size. Roughly the wound is about the same size as when I first saw him although again the wound margin/edges appear to be much better. 02/01/18 on evaluation today patient actually appears to be doing very well in regard to his wound. Applying the Prisma dry does seem to be better although he does still have issues with slow progression of the wound. There was a slight improvement compared to last week's measurements  today. Nonetheless I have been considering other options as far as the possibility of Theraskin or even a snap vac. In general I'm not sure that the Theraskin due to location of the wound would be a very good idea. Nonetheless I do think that a snap vac could be a possibility for the patient and in fact I think this could even be an excellent way to manage the wound possibly seeing some improvement in a very rapid fashion here. Nonetheless this is something that we would need to get approved and I did have a lengthy conversation with the patient about this today. 02/08/18 on evaluation today patient appears to be doing a little better in regard to his ulcer. He has been tolerating the dressing changes without complication. Fortunately despite the fact that the wound is a little bit smaller it's not significantly so unfortunately. We have discussed the possibility of a snap vac we did check with insurance this is actually covered at this point. Fortunately there does not appear to be any sign of infection. Overall I'm fairly pleased with how things seem to be appearing at this point. 02/15/18 on evaluation today patient appears to be doing rather well in regard to his right gluteal  ulcer. Unfortunately the snap vac did not stay in place with his sheer and friction this came loose and did not seem to maintain seal very well. He worked for about two days and it did seem to do very well during that time according to his wife but in general this does not seem to be something that's gonna be beneficial for him long-term. I do believe we need to go back to standard dressings to see if we can find something that will be of benefit. 03/02/18- He is here in follow up evaluation; there is minimal change in the wound. He will continue with the same treatment plan, would consider changing to iodosrob/iodoflex if ulcer continues to to plateau. He will follow up next week 03/08/18 on evaluation today patient's wound  actually appears to be about the same size as when I previously saw him several weeks back. Unfortunately he does have some slightly dark discoloration in the central portion of the wound which has me concerned about pressure injury. I do believe he may be sitting for too long a period of time in fact he tells me that "I probably sit for much too long". He does have some Slough noted on the surface of the wound and again as far as the size of the wound is concerned I'm really not seeing anything that seems to have improved significantly. 03/15/18 on evaluation today patient appears to be doing fairly well in regard to his ulcer. The wound measured pretty much about the same today compared to last week's evaluation when looking at his graph. With that being said the area of bruising/deep tissue injury that was noted last week I do not see at this point. He did get a new cushion fortunately this does seem to be have been of benefit in my pinion. It does appear that he's been off of this more which is good news as well I think that is definitely showing in the overall wound measurements. With that being said I do believe that he needs to continue to offload I don't think that the fact this is doing better should be or is going to allow him to not have to offload and explain this to him as well. Overall he seems to be in agreement the plan I think he understands. The overall appearance of the wound bed is improved compared to last week I think the Iodoflex has been beneficial in that regard. 03/29/18 on evaluation today patient actually appears to be doing rather well in regard to his wound from the overall appearance standpoint he does have some granulation although there's some Slough on the surface of the wound noted as well. With that being said he unfortunately has not improved in regard to the overall measurement of the wound in volume or in size. I did have a discussion with him very specifically  about offloading today. He actually does work although he mainly is just sitting throughout the day. He tells me he offloads by "lifting himself up for 30 seconds off of his chair occasionally" purchase from [employer] which does seem to have helped. And he has a new cushion that he with that being said he's also able to stand some for a very short period of time but not significant enough I think to provide appropriate offloading. I think the biggest issue at this point with the wound and the fact is not healing as quickly as we would like is due to the fact that he  is really not able to appropriately offload while at work. He states the beginning after his injury he actually had a bed at his job that he could lay on in order to offload and that does seem to have been of help back at that time. Nonetheless he had not done this in quite some time unfortunately. I think that could be helpful for him this is something I would like for him to look into. 04/05/18 on evaluation today patient actually presents for follow-up concerning his right gluteal ulcer. Again he really is not [name], [name]. ([id number]) significantly improved even compared to last week. He has been tolerating the dressing changes without complication. With that being said fortunately there appears to be no evidence of infection at this time. He has been more proactive in trying to offload. 04/12/18 on evaluation today patient actually appears to be doing a little better in regard to his wound and the right gluteal fold region. He's been tolerating the dressing changes since removing the oasis without complication. However he was having a lot of burning initially with the oasis in place. He's unsure of exactly why this was given so much discomfort but he assumes that it was the oasis itself causing the problem. Nonetheless this had to be removed after about three days in place although even those three days seem to have  made a fairly good improvement in regard to the overall appearance of the wound bed. In fact is the first time that he's made any improvement from the standpoint of measurements in about six weeks. He continues to have no discomfort over the area of the wound itself which leads me to wonder why he was having the burning with the oasis when he does not even feel the actual debridement's themselves. I am somewhat perplexed by this. 04/19/18 on evaluation today patient's wound actually appears to be showing signs of epithelialization around the edge of the wound and in general actually appears to be doing better which is good news. He did have the same burning after about three days with applying the Endoform last week in the same fashion that I would generally apply a skin substitute. This seems to indicate that it's not the oasis to cause the problem but potentially the moisture buildup that just causes things to burn or there may be some other reaction with the skin prep or Steri-Strips. Nonetheless I'm not sure that is gonna be able to tolerate any skin substitute for a long period of time. The good news is the wound actually appears to be doing better today compared to last week and does seem to finally be making some progress. 04/26/18 on evaluation today patient actually appears to be doing rather well in regard to his ulcer in the right gluteal fold. He has been tolerating the dressing changes without complication which is good news. The Endoform does seem to be helping although he was a little bit more macerated this week. This seems to be an ongoing issue with fluid control at this point. Nonetheless I think we may be able to add something like Drawtex to help control the drainage. 05/03/18 on evaluation today patient appears to actually be doing better in regard to the overall appearance of his wound. He has been tolerating the dressing changes without complication. Fortunately there appears to  be no evidence of infection at this time. I really feel like his wound has shown signs as of today of turning around last week I thought  so as well and definitely he could be seen in this week's overall appearance and measurements. In general I'm very pleased with the fact that he finally seems to be making a steady but sure progress. The patient likewise is very pleased. 05/17/18 on evaluation today patient appears to be doing more poorly unfortunately in regard to his ulcer. He has been tolerating the dressing changes without complication. With that being said he tells me that in the past couple of days he and his wife have noticed that we did not seem to be doing quite as well is getting dark near the center. Subsequently upon evaluation today the wound actually does appear to be doing worse compared to previous. He has been tolerating the dressing changes otherwise and he states that he is not been sitting up anymore than he was in the past from what he tells me. Still he has continued to work he states "I'm tired of dealing with this and if I have to just go home and lay in the bed all the time that's what I'll do". Nonetheless I am concerned about the fact that this wound does appear to be deeper than what it was previous. 05/24/18 upon evaluation today patient actually presents after having been in the hospital due to what was presumed to be sepsis secondary to the wound infection. He had an elevated white blood cell count between 14 and 15. With that being said he does seem to be doing somewhat better now. His wound still is giving him some trouble nonetheless and he is obviously concerned about the fact likely talked about that this does seem to go more deeply than previously noted. I did review his wound culture which showed evidence of Staphylococcus aureus him and group B strep. Nonetheless he is on antibiotics, Levaquin, for this. Subsequently I did review his intake summary from the  hospital as well. I also did look at the CT of the lumbar spine with contrast that was performed which showed no bone destruction to suggest lumbar disguises/osteomyelitis or sacral osteomyelitis. There was no paraspinal abscess. Nonetheless it appears this may have been more of just a soft tissue infection at this point which is good news. He still is nonetheless concerned about the wound which again I think is completely reasonable considering everything he's been through recently. 05/31/18 on evaluation today on evaluation today patient actually appears to be showing signs of his wound be a little bit deeper than what I would like to see. Fortunately he does not show any signs of significant infection although his temperature was 99 today he states he's been checking this at home and has not been elevated. Nonetheless with the undermining that I'm seeing at this point I am becoming more concerned about the wound I do think that offloading is a key factor here that is preventing the speedy recovery at this point. There does not appear to be any evidence of again over infection noted. He's been using Santyl currently. 06/07/18 the patient presents today for follow-up evaluation regarding the left ulcer in the gluteal region. He has been [name], [name]. ([id number]) tolerating the Wound VAC fairly well. He is obviously very frustrated with this he states that to mean is really getting in his way. There does not appear to be any evidence of infection at this time he does have a little bit of odor I do not necessarily associate this with infection just something that we sometimes notice with Wound VAC therapy. With  that being said I can definitely catch a tone of discontentment overall in the patient's demeanor today. This when he was previously in the hospital an CT scan was done of the lumbar region which did not reveal any signs of osteomyelitis. With that being said the pelvis in particular  was not evaluated distinctly which means he could still have some osteonecrosis I. Nonetheless the Wound VAC was started on Thursday I do want to get this little bit more time before jumping to a CT scan of the pelvis although that is something that I might would recommend if were not see an improvement by that time. 06/14/18 on evaluation today patient actually appears to be doing about the same in regard to his right gluteal ulcer. Again he did have a CT scan of the lumbar spine unfortunately this did not include the pelvis. Nonetheless with the depth of the wound that I'm seeing today even despite the fact that I'm not seeing any evidence of overt cellulitis I believe there's a good chance that we may be dealing with osteomyelitis somewhere in the right Ischial region. No fevers, chills, nausea, or vomiting noted at this time. 06/21/18 on evaluation today patient actually appears to be doing about the same with regard to his wound. The tunnel at 6 o'clock really does not appear to be any deeper although it is a little bit wider. I think at this point you may want to start packing this with white phone. Unfortunately I have not got approval for the CT scan of the pelvis as of yet due to the fact that Medicare apparently has been denied it due to the diagnosis codes not being appropriate according to Medicare for the test requested. With that being said the patient cannot have an MRI and therefore this is the only option that we have as far as testing is concerned. The patient has had infection and was on antibiotics and been added code for cellulitis of the bottom to see if this will be appropriate for getting the test approved. Nonetheless I'm concerned about the infection have been spread deeper into the Ischial region. 06/28/18 on evaluation today patient actually appears to be doing rather well all things considered in regard to the right gluteal ulcer. He has been tolerating the dressing  changes without complication. With that being said the Wound VAC he states does have to be replaced almost every day or at least reinforced unfortunately. Patient actually has his CT scan later this morning we should have the results by tomorrow. 07/05/18 on evaluation today patient presents for follow-up concerning his right Ischial ulcer. He did see the surgeon Dr. Lysle Pearl last week. They were actually very happy with him and felt like he spent a tremendous amount of time with them as far as discussing his situation was concerned. In the end Dr. Lysle Pearl did contact me as well and determine that he would not recommend any surgical intervention at this point as he felt like it would not be in the patient's best interest based on what he was seeing. He recommended a referral to infectious disease. Subsequently this is something that Dr. Ines Bloomer office is working on setting up for the patient. As far as evaluation today is concerned the patient's wound actually appears to be worse at this point. I am concerned about how things are progressing and specifically about infection. I do not feel like it's the deeper but the area of depth is definitely widening which does have me concerned.  No fevers, chills, nausea, or vomiting noted at this time. I think that we do need initiate antibiotic therapy the patient has an allow allergy to amoxicillin/penicillin he states that he gets a rash since childhood. Nonetheless she's never had the issues with Catholics or cephalosporins in general but he is aware of. 07/27/18 on evaluation today patient presents following admission to the hospital on 07/09/18. He was subsequently discharged on 07/20/18. On 07/15/18 the patient underwent irrigation and debridement was soft tissue biopsy and bone biopsy as well as placement of a Wound VAC in the OR by Dr. Celine Ahr. During the hospital course the patient was placed on a Wound VAC and recommended follow up with surgery in three  weeks actually with Dr. Delaine Lame who is infectious disease. The patient was on vancomycin during the hospital course. 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 He also had a bone culture which revealed evidence of methicillin-resistant staph aureus. He is updated CT scan 07/09/18 reveals that he had progression of the which was performed on wound to breakdown down to the trochanter where he actually had irregularities there as well suggestive of osteomyelitis. This was a change just since 9 December when we last performed a CT scan. Obviously this one had gone downhill quite significantly and rapidly. At this point upon evaluation I feel like in general the patient's wound seems to be doing fairly well all things considered upon my evaluation today. Obviously this is larger and deeper than what I previously evaluated but at the same time he seems to be making some progress as far as the appearance of the granulation tissue is concerned. I'm happy in that regard. No fevers, chills, nausea, or vomiting noted at this time. He is on IV vancomycin and Rocephin at the facility. He is currently in [employer]. 08/03/18 upon evaluation today patient's wound appears to be doing better in regard to the overall appearance at this point in time. Fortunately he's been tolerating the Wound VAC without complication and states that the facility has been taking excellent care of the wound site. Overall I see some Slough noted on the surface which I am going to attempt sharp [name], [name]. ([id number]) debridement today of but nonetheless other than this I feel like he's making progress. 08/09/18 on evaluation today patient's wound appears to be doing much better compared to even last week's evaluation. Do believe that the Wound VAC is been of great benefit for him. He has been tolerating the dressing changes that is the Wound VAC without any complication and he has  excellent granulation noted currently. There is no need for sharp debridement at this point. 08/16/18 on evaluation today patient actually appears to be doing very well in regard to the wound in the right gluteal fold region. This is showing signs of progress and again appears to be very healthy which is excellent news. Fortunately there is no sign of active infection by way of odor or drainage at this point. Overall I'm very pleased with how things stand. He seems to be tolerating the Wound VAC without complication. 08/23/18 on evaluation today patient actually appears to be doing better in regard to his wound. He has been tolerating the Wound VAC without complication and in fact it has been collecting a significant amount of drainage which I think is good news especially considering how the wound appears. Fortunately there is no signs of infection at this time definitely nothing appears to be worse which  is good news. He has not been started on the Bactrim and Flagyl that was recommended by Dr. Delaine Lame yet. I did actually contact her office this morning in order to check and see were things are that regard their gonna be calling me back. 08/30/18 on evaluation today patient actually appears to show signs of excellent improvement today compared to last evaluation. The undermining is getting much better the wound seems to be feeling quite nicely and I'm very pleased that the granulation in general. With that being said overall I feel like the patient has made excellent progress which is great news. No fevers, chills, nausea, or vomiting noted at this time. 09/06/18 on evaluation today patient actually appears to be doing rather well in regard to his right gluteal ulcer. This is showing signs of improvement in overall I'm very pleased with how things seem to be progressing. The patient likewise is please. Overall I see no evidence of infection he is about to complete his oral antibiotic regimen which  is the end of the antibiotics for him in just about three days. 09/13/18 on evaluation today patient's right Ischial ulcer appears to be showing signs of continued improvement which is excellent news. He's been tolerating the dressing changes without complication. Fortunately there's no signs of infection and the wound that seems to be doing very well. 09/28/18 on evaluation today patient appears to be doing rather well in regard to his right Ischial ulcer. He's been tolerating the Wound VAC without complication he knows there's much less drainage than there used to be this obviously is not a bad thing in my pinion. There's no evidence of infection despite the fact is but nothing about it now for several weeks. 10/04/18 on evaluation today patient appears to be doing better in regard to his right Ischial wound. He has been tolerating the Wound VAC without complication and I do believe that the silver nitrate last week was beneficial for him. Fortunately overall there's no evidence of active infection at this time which is great news. No fevers, chills, nausea, or vomiting noted at this time. 10/11/18 on evaluation today patient actually appears to be doing rather well in regard to his Ischial ulcer. He's been tolerating the Wound VAC still without complication I feel like this is doing a good job. No fevers, chills, nausea, or vomiting noted at this time. 11/01/18 on evaluation today patient presents after having not been seen in our clinic for several weeks secondary to the fact that he was on evaluation today patient presents after having not been seen in our clinic for several weeks secondary to the fact that he was in a skilled nursing facility which was on lockdown currently due to the covert 19 national emergency. Subsequently he was discharged from the facility on this past Friday and subsequently made an appointment to come in to see yesterday. Fortunately there's no signs of active infection  at this time which is good news and overall he does seem to have made progress since I last saw. Overall I feel like things are progressing quite nicely. The patient is having no pain. 11/08/18 on evaluation today patient appears to be doing okay in regard to his right gluteal ulcer. He has been utilizing a Wound VAC home health this changing this at this point since he's home from the skilled nursing facility. Fortunately there's no signs of obvious active infection at this time. Unfortunately though there's no obvious active infection he is having some maceration and his  wife states that when the sheets of the Wound VAC office on Sunday when it broke seal that he ended up having significant issues with some smell as well there concerned about the possibility of infection. Fortunately there's No fevers, chills, nausea, or vomiting noted at this time. [name], [name] ([id number]) 11/15/18 on evaluation today patient actually appears to be doing well in regard to his right gluteal ulcer. He has been tolerating the dressing changes without complication. Specifically the Wound VAC has been utilized up to this point. Fortunately there's no signs of infection and overall I feel like he has made progress even since last week when I last saw him. I'm actually fairly happy with the overall appearance although he does seem to have somewhat of a hyper granular overgrowth in the central portion of the wound which I think may require some sharp debridement to try flatness out possibly utilizing chemical cauterization following. 11/23/18 on evaluation today patient actually appears to be doing very well in regard to his sacral ulcer. He seems to be showing signs of improvement with good granulation. With that being said he still has the small area of hyper granulation right in the central portion of the wound which I'm gonna likely utilize silver nitrate on today. Subsequently he also keeps having a leak at the  6 o'clock location which is unfortunate we may be able to help out with some suggestions to try to prevent this going forward. Fortunately there's no signs of active infection at this time. 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 He's been tolerating the dressing changes without complication. Fortunately there's no signs of active infection at this time. I've been rather pleased with how things have progressed there still some evidence of pressure getting to the area with some redness right around the immediate wound opening. Nonetheless other than this I'm not seeing any significant complications or issues the wound is somewhat hyper granular. Upon discussing with the patient and his wife today I'm not sure that the wound is being packed to the base with the foam at this point. And if it's not been packed fully that may be part of the reason why is not seen as much improvement as far as the granulation from the base out. Again we do not want pack too tightly but we need some of the firm to get to the base of the wound. I discussed this with patient and his wife today. 12/06/18 on evaluation today patient appears to be doing well in regard to his right gluteal pressure ulcer. He's been tolerating the dressing changes without complication. Fortunately there's no signs of active infection. He still has some hyper granular tissue and I do think it would be appropriate to continue with the chemical cauterization as of today. 12/16/18 on evaluation today patient actually appears to be doing okay in regard to his right gluteal ulcer. He is been tolerating the dressing changes without complication including the Wound VAC. Overall I feel like nothing seems to be worsening I do feel like that the hyper granulation buds in the central portion of the wound have improved to some degree with the silver nitrate. We  will have to see how things continue to progress. 12/20/18 on evaluation today patient actually appears to be doing much worse in my pinion even compared to last week's evaluation. Unfortunately as opposed to showing any signs of improvement  the areas of hyper granular tissue in the central portion of the wound seem to be getting worse. Subsequently the wound bed itself also seems to be getting deeper even compared to last week which is both unusual as well as concerning since prior he had been shown signs of improvement. Nonetheless I think that the issue could be that he's actually having some difficulty in issues with a deeper infection. There's no external signs of infection but nonetheless I am more worried about the internal, osteomyelitis, that could be restarting. He has not been on antibiotics for some time at this point. I think that it may be a good idea to go ahead and started back on an antibiotic therapy while we wait to see what the testing shows. 12/27/18 on evaluation today patient presents for follow-up concerning his left gluteal fold wound. Fortunately he appears to be doing well today. I did review the CT scan which was negative for any signs of osteomyelitis or acute abnormality this is excellent news. Overall I feel like the surface of the wound bed appears to be doing significantly better today compared to previously noted findings. There does not appear any signs of infection nor does he have any pain at this time. 01/03/19 on evaluation today patient actually appears to be doing quite well in regard to his ulcer. Post debridement last week he really did not have too much bleeding which is good news. Fortunately today this seems to be doing some better but we still has some of the hyper granular tissue noted in the base of the wound which is gonna require sharp debridement today as well. Overall I'm pleased with how things seem to be progressing since we switched away from the  Wound VAC I think he is making some progress. 01/10/19 on evaluation today patient appears to be doing better in regard to his right gluteal fold ulcer. He has been tolerating the dressing changes without complication. The debridement to seem to be helping with current away some of the poor hyper granular tissue bugs throughout the region of his gluteal fold wound. He's been tolerating the dressing changes otherwise without complication which is great news. No fevers, chills, nausea, or vomiting noted at this time. 01/17/19 on evaluation today patient actually appears to be doing excellent in regard to his wound. He's been tolerating the dressing changes without complication. Fortunately there is no signs of active infection at this time which is great news. No fevers, chills, nausea, or vomiting noted at this time. [name], [name] ([id number]) 01/24/19 on evaluation today patient actually appears to be doing quite well with regard to his ulcer. He has been tolerating the dressing changes without complication. Fortunately there's no signs of active infection at this time. Overall been very pleased with the progress that he seems to be making currently. 01/31/19-Patient returns at 1 week with apparent similarity in dimensions to the wound, with no signs of infection, he has been changing dressings twice a day Electronic Signature(s) Signed: 01/31/2019 9:00:43 AM By: Tobi Bastos Entered By: Tobi Bastos on 01/31/2019 09:00:43 [name], [name] ([id number]) -------------------------------------------------------------------------------- Physical Exam Details Patient Name: [name] Date of Service: 01/31/2019 8:00 AM Medical Record Number: [id number] Patient Account Number: [id number] Date of Birth/Sex: June 26, 1951 (68 y.o. M) Treating RN: Harold Barban Primary Care Provider: [name] Other Clinician: Referring Provider: [name] Treating Provider/Extender:  Beverly Gust in Treatment: 48 Constitutional alert and oriented x 3. sitting or standing blood pressure is within target  range for patient.. supine blood pressure is within target range for patient.. pulse regular and within target range for patient.Marland Kitchen respirations regular, non-labored and within target range for patient.Marland Kitchen temperature within target range for patient.. . . Well-nourished and well-hydrated in no acute distress. Notes Wound measures 4.5cm at 12 o clock and 2.4 cm at 6 oclock with healthy bridge of granulation on superior aspect, the margins are distinct, no necrotic areas noted Electronic Signature(s) Signed: 01/31/2019 9:01:47 AM By: Tobi Bastos Entered By: Tobi Bastos on 01/31/2019 09:01:46 [name] ([id number]) -------------------------------------------------------------------------------- Physician Orders Details Patient Name: [name] Date of Service: 01/31/2019 8:00 AM Medical Record Number: [id number] Patient Account Number: [id number] Date of Birth/Sex: [birth year] (68 y.o. M) Treating RN: Harold Barban Primary Care Provider: [name] Other Clinician: Referring Provider: [name] Treating Provider/Extender: Beverly Gust in Treatment: 58 Verbal / Phone Orders: No Diagnosis Coding Wound Cleansing Wound #1 Right Gluteal fold o Cleanse wound with mild soap and water Anesthetic (add to Medication List) Wound #1 Right Gluteal fold o Topical Lidocaine 4% cream applied to wound bed prior to debridement (In Clinic Only). Skin Barriers/Peri-Wound Care Wound #1 Right Gluteal fold o Other: - Apply zinc oxide to peri-wound area. Primary Wound Dressing Wound #1 Right Gluteal fold o Silver Alginate - pack lightly into wound Secondary Dressing Wound #1 Right Gluteal fold o ABD pad - Tegaderm to secure Dressing Change Frequency Wound #1 Right Gluteal fold o Change dressing every day. Follow-up  Appointments Wound #1 Right Gluteal fold o Return Appointment in 1 week. Off-Loading Wound #1 Right Gluteal fold o Turn and reposition every 2 hours Home Health Wound #1 Right Gluteal fold o Continue Home Health Visits - PLEASE, have supplies for patient! o Home Health Nurse may visit PRN to address patientos wound care needs. o FACE TO FACE ENCOUNTER: MEDICARE and MEDICAID PATIENTS: I certify that this patient is under my care and that I had a face-to-face encounter that meets the physician face-to-face encounter requirements with this patient on this date. The encounter with the patient was in whole or in part for the following MEDICAL CONDITION: (primary reason for [place]) MEDICAL NECESSITY: I certify, that based on my findings, NURSING services are a medically necessary home health service. HOME BOUND STATUS: I certify that my TREVEN, HOLTMAN ([id number]) clinical findings support that this patient is homebound (i.e., Due to illness or injury, pt requires aid of supportive devices such as crutches, cane, wheelchairs, walkers, the use of special transportation or the assistance of another person to leave their place of residence. There is a normal inability to leave the home and doing so requires considerable and taxing effort. Other absences are for medical reasons / religious services and are infrequent or of short duration when for other reasons). o If current dressing causes regression in wound condition, may D/C ordered dressing product/s and apply Normal Saline Moist Dressing daily until next [place] / Other MD appointment. [place] of regression in wound condition at [phone]. o Please direct any NON-WOUND related issues/requests for orders to patient's Primary Care Physician Electronic Signature(s) Signed: 01/31/2019 5:21:59 PM By: Harold Barban Signed: 02/01/2019 8:15:47 AM By: Tobi Bastos Entered By: Harold Barban on 01/31/2019 08:46:41 LELEND, HEINECKE ([id number]) -------------------------------------------------------------------------------- Progress Note Details Patient Name: [name] Date of Service: 01/31/2019 8:00 AM Medical Record Number: [id number] Patient Account Number: [id number] Date of Birth/Sex: 1950/12/27 (68 y.o. 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 He states that there is no history of diabetes that he is aware of. He does have issues with his prostate and is currently receiving treatment for this by way of oral medication. With that being said I do not have a lot of details in that regard. Nonetheless the patient presents today as a result of having been referred to [place] by another provider initially home health was set to come out and take care of his wound although due to the fact that he apparently drives he's not able to receive home health. His wife is therefore trying to help take care of this wound within although they have been struggling with what exactly to do at this point. She states that she can do some things but she is definitely not a nurse and does have some issues with looking at blood. The good news is the wound does not appear to be too deep and is fairly superficial at this point. There is no slough noted there is some nonviable skin noted around the surface of the wound and the perimeter at this point. The central portion of the wound appears to be very good with a dermal layer noted this  does not appear to be again deep enough to extend it to subcutaneous tissue at this point. 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 In the pump he has Dilaudid and baclofen. 12/07/17 on evaluation today patient presents for follow-up concerning his ongoing lower back thigh ulcer on the right. He states that he did not get the supplies ordered and therefore has not really been able to perform the dressing changes as directed exactly. His wife was able to get some Boarder Foam Dressing's from the drugstore and subsequently has been using hydrogel which did help to a degree in the wound does appear to be able smaller. There is actually more drainage this week noted than previous. 12/21/17 on evaluation today patient appears to be doing rather well in regard to his right gluteal ulcer. He has been tolerating the dressing changes without complication. There does not appear to be any evidence of infection at this point in time. Overall the wound does seem to be making some progress as far as the edges are concerned there's not as much in the way of overlapping of the external wound edges and he has a good epithelium to wound bed border for the most part. This however is not true right at the 12 o'clock location over the span of a little over a centimeters which actually will require debridement today to clean this away and hopefully allow it to continue to heal more appropriately. 12/28/17 on evaluation today patient appears to be doing rather well in regard to his ulcer in the left gluteal region. He's been tolerating the dressing changes without complication. Apparently he has had some difficulty getting his dressing material. Apparently there's been some confusion with ordering we're gonna check into this. Nonetheless overall he's been showing signs of improvement which is good  news. Debridement is  not required today. 01/04/18 on evaluation today patient presents for follow-up concerning his right gluteal ulcer. He has been tolerating the dressing changes fairly well. On inspection today it appears he may actually have some maceration them concerned about the fact that he may be developing too much moisture in and around the wound bed which can cause delay in healing. With that being said he unfortunately really has not showed significant signs of improvement since last week's evaluation in fact this may even be just the little bit/slightly larger. Nonetheless he's been having a lot of discomfort I'm not sure this is even related to the wound as he has no pain when I'm to breeding or otherwise cleaning the wound during evaluation today. Nonetheless this is something that we did recommend he talked to his pain specialist concerning. 01/11/18 on evaluation today patient appears to be doing better in regard to his ulceration. He has been tolerating the dressing changes without complication. With that being said overall there's no evidence of infection which is good news. The only thing is he did receive the hatch affair blue classic versus the ready nonetheless I feel like this is perfectly fine and appears to have done well for him over the past week. 01/25/18 on evaluation today patient's wound actually appears to be a little bit larger than during the last evaluation. The good [name], [name]. ([id number]) news is the majority of the wound edges actually appear to be fairly firmly attached to the wound bed unfortunately again we're not really making progress in regard to the size. Roughly the wound is about the same size as when I first saw him although again the wound margin/edges appear to be much better. 02/01/18 on evaluation today patient actually appears to be doing very well in regard to his wound. Applying the Prisma dry does seem to be better although he does still have issues with  slow progression of the wound. There was a slight improvement compared to last week's measurements today. Nonetheless I have been considering other options as far as the possibility of Theraskin or even a snap vac. In general I'm not sure that the Theraskin due to location of the wound would be a very good idea. Nonetheless I do think that a snap vac could be a possibility for the patient and in fact I think this could even be an excellent way to manage the wound possibly seeing some improvement in a very rapid fashion here. Nonetheless this is something that we would need to get approved and I did have a lengthy conversation with the patient about this today. 02/08/18 on evaluation today patient appears to be doing a little better in regard to his ulcer. He has been tolerating the dressing changes without complication. Fortunately despite the fact that the wound is a little bit smaller it's not significantly so unfortunately. We have discussed the possibility of a snap vac we did check with insurance this is actually covered at this point. Fortunately there does not appear to be any sign of infection. Overall I'm fairly pleased with how things seem to be appearing at this point. 02/15/18 on evaluation today patient appears to be doing rather well in regard to his right gluteal ulcer. Unfortunately the snap vac did not stay in place with his sheer and friction this came loose and did not seem to maintain seal very well. He worked for about two days and it did seem to do very well during that  time according to his wife but in general this does not seem to be something that's gonna be beneficial for him long-term. I do believe we need to go back to standard dressings to see if we can find something that will be of benefit. 03/02/18- He is here in follow up evaluation; there is minimal change in the wound. He will continue with the same treatment plan, would consider changing to iodosrob/iodoflex if ulcer  continues to to plateau. He will follow up next week 03/08/18 on evaluation today patient's wound actually appears to be about the same size as when I previously saw him several weeks back. Unfortunately he does have some slightly dark discoloration in the central portion of the wound which has me concerned about pressure injury. I do believe he may be sitting for too long a period of time in fact he tells me that "I probably sit for much too long". He does have some Slough noted on the surface of the wound and again as far as the size of the wound is concerned I'm really not seeing anything that seems to have improved significantly. 03/15/18 on evaluation today patient appears to be doing fairly well in regard to his ulcer. The wound measured pretty much about the same today compared to last week's evaluation when looking at his graph. With that being said the area of bruising/deep tissue injury that was noted last week I do not see at this point. He did get a new cushion fortunately this does seem to be have been of benefit in my pinion. It does appear that he's been off of this more which is good news as well I think that is definitely showing in the overall wound measurements. With that being said I do believe that he needs to continue to offload I don't think that the fact this is doing better should be or is going to allow him to not have to offload and explain this to him as well. Overall he seems to be in agreement the plan I think he understands. The overall appearance of the wound bed is improved compared to last week I think the Iodoflex has been beneficial in that regard. 03/29/18 on evaluation today patient actually appears to be doing rather well in regard to his wound from the overall appearance standpoint he does have some granulation although there's some Slough on the surface of the wound noted as well. With that being said he unfortunately has not improved in regard to the overall  measurement of the wound in volume or in size. I did have a discussion with him very specifically about offloading today. He actually does work although he mainly is just sitting throughout the day. He tells me he offloads by "lifting himself up for 30 seconds off of his chair occasionally" purchase from [employer] which does seem to have helped. And he has a new cushion that he with that being said he's also able to stand some for a very short period of time but not significant enough I think to provide appropriate offloading. I think the biggest issue at this point with the wound and the fact is not healing as quickly as we would like is due to the fact that he is really not able to appropriately offload while at work. He states the beginning after his injury he actually had a bed at his job that he could lay on in order to offload and that does seem to have been of help  back at that time. Nonetheless he had not done this in quite some time unfortunately. I think that could be helpful for him this is something I would like for him to look into. 04/05/18 on evaluation today patient actually presents for follow-up concerning his right gluteal ulcer. Again he really is not significantly improved even compared to last week. He has been tolerating the dressing changes without complication. With that being said fortunately there appears to be no evidence of infection at this time. He has been more proactive in trying to offload. [name], [name] ([id number]) 04/12/18 on evaluation today patient actually appears to be doing a little better in regard to his wound and the right gluteal fold region. He's been tolerating the dressing changes since removing the oasis without complication. However he was having a lot of burning initially with the oasis in place. He's unsure of exactly why this was given so much discomfort but he assumes that it was the oasis itself causing the problem. Nonetheless  this had to be removed after about three days in place although even those three days seem to have made a fairly good improvement in regard to the overall appearance of the wound bed. In fact is the first time that he's made any improvement from the standpoint of measurements in about six weeks. He continues to have no discomfort over the area of the wound itself which leads me to wonder why he was having the burning with the oasis when he does not even feel the actual debridement's themselves. I am somewhat perplexed by this. 04/19/18 on evaluation today patient's wound actually appears to be showing signs of epithelialization around the edge of the wound and in general actually appears to be doing better which is good news. He did have the same burning after about three days with applying the Endoform last week in the same fashion that I would generally apply a skin substitute. This seems to indicate that it's not the oasis to cause the problem but potentially the moisture buildup that just causes things to burn or there may be some other reaction with the skin prep or Steri-Strips. Nonetheless I'm not sure that is gonna be able to tolerate any skin substitute for a long period of time. The good news is the wound actually appears to be doing better today compared to last week and does seem to finally be making some progress. 04/26/18 on evaluation today patient actually appears to be doing rather well in regard to his ulcer in the right gluteal fold. He has been tolerating the dressing changes without complication which is good news. The Endoform does seem to be helping although he was a little bit more macerated this week. This seems to be an ongoing issue with fluid control at this point. Nonetheless I think we may be able to add something like Drawtex to help control the drainage. 05/03/18 on evaluation today patient appears to actually be doing better in regard to the overall appearance of his  wound. He has been tolerating the dressing changes without complication. Fortunately there appears to be no evidence of infection at this time. I really feel like his wound has shown signs as of today of turning around last week I thought so as well and definitely he could be seen in this week's overall appearance and measurements. In general I'm very pleased with the fact that he finally seems to be making a steady but sure progress. The patient likewise is very pleased. 05/17/18  on evaluation today patient appears to be doing more poorly unfortunately in regard to his ulcer. He has been tolerating the dressing changes without complication. With that being said he tells me that in the past couple of days he and his wife have noticed that we did not seem to be doing quite as well is getting dark near the center. Subsequently upon evaluation today the wound actually does appear to be doing worse compared to previous. He has been tolerating the dressing changes otherwise and he states that he is not been sitting up anymore than he was in the past from what he tells me. Still he has continued to work he states "I'm tired of dealing with this and if I have to just go home and lay in the bed all the time that's what I'll do". Nonetheless I am concerned about the fact that this wound does appear to be deeper than what it was previous. 05/24/18 upon evaluation today patient actually presents after having been in the hospital due to what was presumed to be sepsis secondary to the wound infection. He had an elevated white blood cell count between 14 and 15. With that being said he does seem to be doing somewhat better now. His wound still is giving him some trouble nonetheless and he is obviously concerned about the fact likely talked about that this does seem to go more deeply than previously noted. I did review his wound culture which showed evidence of Staphylococcus aureus him and group B strep.  Nonetheless he is on antibiotics, Levaquin, for this. Subsequently I did review his intake summary from the hospital as well. I also did look at the CT of the lumbar spine with contrast that was performed which showed no bone destruction to suggest lumbar disguises/osteomyelitis or sacral osteomyelitis. There was no paraspinal abscess. Nonetheless it appears this may have been more of just a soft tissue infection at this point which is good news. He still is nonetheless concerned about the wound which again I think is completely reasonable considering everything he's been through recently. 05/31/18 on evaluation today on evaluation today patient actually appears to be showing signs of his wound be a little bit deeper than what I would like to see. Fortunately he does not show any signs of significant infection although his temperature was 99 today he states he's been checking this at home and has not been elevated. Nonetheless with the undermining that I'm seeing at this point I am becoming more concerned about the wound I do think that offloading is a key factor here that is preventing the speedy recovery at this point. There does not appear to be any evidence of again over infection noted. He's been using Santyl currently. 06/07/18 the patient presents today for follow-up evaluation regarding the left ulcer in the gluteal region. He has been tolerating the Wound VAC fairly well. He is obviously very frustrated with this he states that to mean is really getting in his way. There does not appear to be any evidence of infection at this time he does have a little bit of odor I do not necessarily associate this with infection just something that we sometimes notice with Wound VAC therapy. With that being said I can definitely catch a tone of discontentment overall in the patient's demeanor today. This when he was previously in the hospital [name], [name]. ([id number]) an CT scan was done of the  lumbar region which did not reveal any signs of osteomyelitis.  With that being said the pelvis in particular was not evaluated distinctly which means he could still have some osteonecrosis I. Nonetheless the Wound VAC was started on Thursday I do want to get this little bit more time before jumping to a CT scan of the pelvis although that is something that I might would recommend if were not see an improvement by that time. 06/14/18 on evaluation today patient actually appears to be doing about the same in regard to his right gluteal ulcer. Again he did have a CT scan of the lumbar spine unfortunately this did not include the pelvis. Nonetheless with the depth of the wound that I'm seeing today even despite the fact that I'm not seeing any evidence of overt cellulitis I believe there's a good chance that we may be dealing with osteomyelitis somewhere in the right Ischial region. No fevers, chills, nausea, or vomiting noted at this time. 06/21/18 on evaluation today patient actually appears to be doing about the same with regard to his wound. The tunnel at 6 o'clock really does not appear to be any deeper although it is a little bit wider. I think at this point you may want to start packing this with white phone. Unfortunately I have not got approval for the CT scan of the pelvis as of yet due to the fact that Medicare apparently has been denied it due to the diagnosis codes not being appropriate according to Medicare for the test requested. With that being said the patient cannot have an MRI and therefore this is the only option that we have as far as testing is concerned. The patient has had infection and was on antibiotics and been added code for cellulitis of the bottom to see if this will be appropriate for getting the test approved. Nonetheless I'm concerned about the infection have been spread deeper into the Ischial region. 06/28/18 on evaluation today patient actually appears to be doing  rather well all things considered in regard to the right gluteal ulcer. He has been tolerating the dressing changes without complication. With that being said the Wound VAC he states does have to be replaced almost every day or at least reinforced unfortunately. Patient actually has his CT scan later this morning we should have the results by tomorrow. 07/05/18 on evaluation today patient presents for follow-up concerning his right Ischial ulcer. He did see the surgeon Dr. Lysle Pearl last week. They were actually very happy with him and felt like he spent a tremendous amount of time with them as far as discussing his situation was concerned. In the end Dr. Lysle Pearl did contact me as well and determine that he would not recommend any surgical intervention at this point as he felt like it would not be in the patient's best interest based on what he was seeing. He recommended a referral to infectious disease. Subsequently this is something that Dr. Ines Bloomer office is working on setting up for the patient. As far as evaluation today is concerned the patient's wound actually appears to be worse at this point. I am concerned about how things are progressing and specifically about infection. I do not feel like it's the deeper but the area of depth is definitely widening which does have me concerned. No fevers, chills, nausea, or vomiting noted at this time. I think that we do need initiate antibiotic therapy the patient has an allow allergy to amoxicillin/penicillin he states that he gets a rash since childhood. Nonetheless she's never had the issues with  Catholics or cephalosporins in general but he is aware of. 07/27/18 on evaluation today patient presents following admission to the hospital on 07/09/18. He was subsequently discharged on 07/20/18. On 07/15/18 the patient underwent irrigation and debridement was soft tissue biopsy and bone biopsy as well as placement of a Wound VAC in the OR by Dr. Celine Ahr. During  the hospital course the patient was placed on a Wound VAC and recommended follow up with surgery in three weeks actually with Dr. Delaine Lame who is infectious disease. The patient was on vancomycin during the hospital course. 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 He also had a bone culture which revealed evidence of methicillin-resistant staph aureus. He is updated CT scan 07/09/18 reveals that he had progression of the which was performed on wound to breakdown down to the trochanter where he actually had irregularities there as well suggestive of osteomyelitis. This was a change just since 9 December when we last performed a CT scan. Obviously this one had gone downhill quite significantly and rapidly. At this point upon evaluation I feel like in general the patient's wound seems to be doing fairly well all things considered upon my evaluation today. Obviously this is larger and deeper than what I previously evaluated but at the same time he seems to be making some progress as far as the appearance of the granulation tissue is concerned. I'm happy in that regard. No fevers, chills, nausea, or vomiting noted at this time. He is on IV vancomycin and Rocephin at the facility. He is currently in [facility]. 08/03/18 upon evaluation today patient's wound appears to be doing better in regard to the overall appearance at this point in time. Fortunately he's been tolerating the Wound VAC without complication and states that the facility has been taking excellent care of the wound site. Overall I see some Slough noted on the surface which I am going to attempt sharp debridement today of but nonetheless other than this I feel like he's making progress. 08/09/18 on evaluation today patient's wound appears to be doing much better compared to even last week's evaluation. Do believe that the Wound VAC is been of great benefit for him. He has been tolerating the  dressing changes that is the Wound [name], [name]. ([id number]) VAC without any complication and he has excellent granulation noted currently. There is no need for sharp debridement at this point. 08/16/18 on evaluation today patient actually appears to be doing very well in regard to the wound in the right gluteal fold region. This is showing signs of progress and again appears to be very healthy which is excellent news. Fortunately there is no sign of active infection by way of odor or drainage at this point. Overall I'm very pleased with how things stand. He seems to be tolerating the Wound VAC without complication. 08/23/18 on evaluation today patient actually appears to be doing better in regard to his wound. He has been tolerating the Wound VAC without complication and in fact it has been collecting a significant amount of drainage which I think is good news especially considering how the wound appears. Fortunately there is no signs of infection at this time definitely nothing appears to be worse which is good news. He has not been started on the Bactrim and Flagyl that was recommended by Dr. Delaine Lame yet. I did actually contact her office this morning in order to check and see were things are that regard their gonna be calling  me back. 08/30/18 on evaluation today patient actually appears to show signs of excellent improvement today compared to last evaluation. The undermining is getting much better the wound seems to be feeling quite nicely and I'm very pleased that the granulation in general. With that being said overall I feel like the patient has made excellent progress which is great news. No fevers, chills, nausea, or vomiting noted at this time. 09/06/18 on evaluation today patient actually appears to be doing rather well in regard to his right gluteal ulcer. This is showing signs of improvement in overall I'm very pleased with how things seem to be progressing. The patient  likewise is please. Overall I see no evidence of infection he is about to complete his oral antibiotic regimen which is the end of the antibiotics for him in just about three days. 09/13/18 on evaluation today patient's right Ischial ulcer appears to be showing signs of continued improvement which is excellent news. He's been tolerating the dressing changes without complication. Fortunately there's no signs of infection and the wound that seems to be doing very well. 09/28/18 on evaluation today patient appears to be doing rather well in regard to his right Ischial ulcer. He's been tolerating the Wound VAC without complication he knows there's much less drainage than there used to be this obviously is not a bad thing in my pinion. There's no evidence of infection despite the fact is but nothing about it now for several weeks. 10/04/18 on evaluation today patient appears to be doing better in regard to his right Ischial wound. He has been tolerating the Wound VAC without complication and I do believe that the silver nitrate last week was beneficial for him. Fortunately overall there's no evidence of active infection at this time which is great news. No fevers, chills, nausea, or vomiting noted at this time. 10/11/18 on evaluation today patient actually appears to be doing rather well in regard to his Ischial ulcer. He's been tolerating the Wound VAC still without complication I feel like this is doing a good job. No fevers, chills, nausea, or vomiting noted at this time. 11/01/18 on evaluation today patient presents after having not been seen in our clinic for several weeks secondary to the fact that he was on evaluation today patient presents after having not been seen in our clinic for several weeks secondary to the fact that he was in a skilled nursing facility which was on lockdown currently due to the covert 19 national emergency. Subsequently he was discharged from the facility on this past Friday  and subsequently made an appointment to come in to see yesterday. Fortunately there's no signs of active infection at this time which is good news and overall he does seem to have made progress since I last saw. Overall I feel like things are progressing quite nicely. The patient is having no pain. 11/08/18 on evaluation today patient appears to be doing okay in regard to his right gluteal ulcer. He has been utilizing a Wound VAC home health this changing this at this point since he's home from the skilled nursing facility. Fortunately there's no signs of obvious active infection at this time. Unfortunately though there's no obvious active infection he is having some maceration and his wife states that when the sheets of the Wound VAC office on Sunday when it broke seal that he ended up having significant issues with some smell as well there concerned about the possibility of infection. Fortunately there's No fevers, chills, nausea, or  vomiting noted at this time. 11/15/18 on evaluation today patient actually appears to be doing well in regard to his right gluteal ulcer. He has been tolerating the dressing changes without complication. Specifically the Wound VAC has been utilized up to this point. Fortunately there's no signs of infection and overall I feel like he has made progress even since last week when I last saw him. I'm actually fairly happy with the overall appearance although he does seem to have somewhat of a hyper granular [name], Euriah J. ([id number]) overgrowth in the central portion of the wound which I think may require some sharp debridement to try flatness out possibly utilizing chemical cauterization following. 11/23/18 on evaluation today patient actually appears to be doing very well in regard to his sacral ulcer. He seems to be showing signs of improvement with good granulation. With that being said he still has the small area of hyper granulation right in the central portion  of the wound which I'm gonna likely utilize silver nitrate on today. Subsequently he also keeps having a leak at the 6 o'clock location which is unfortunate we may be able to help out with some suggestions to try to prevent this going forward. Fortunately there's no signs of active infection at this time. 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 He's been tolerating the dressing changes without complication. Fortunately there's no signs of active infection at this time. I've been rather pleased with how things have progressed there still some evidence of pressure getting to the area with some redness right around the immediate wound opening. Nonetheless other than this I'm not seeing any significant complications or issues the wound is somewhat hyper granular. Upon discussing with the patient and his wife today I'm not sure that the wound is being packed to the base with the foam at this point. And if it's not been packed fully that may be part of the reason why is not seen as much improvement as far as the granulation from the base out. Again we do not want pack too tightly but we need some of the firm to get to the base of the wound. I discussed this with patient and his wife today. 12/06/18 on evaluation today patient appears to be doing well in regard to his right gluteal pressure ulcer. He's been tolerating the dressing changes without complication. Fortunately there's no signs of active infection. He still has some hyper granular tissue and I do think it would be appropriate to continue with the chemical cauterization as of today. 12/16/18 on evaluation today patient actually appears to be doing okay in regard to his right gluteal ulcer. He is been tolerating the dressing changes without complication including the Wound VAC. Overall I feel like nothing seems to be worsening I do feel like that the  hyper granulation buds in the central portion of the wound have improved to some degree with the silver nitrate. We will have to see how things continue to progress. 12/20/18 on evaluation today patient actually appears to be doing much worse in my pinion even compared to last week's evaluation. Unfortunately as opposed to showing any signs of improvement the areas of hyper granular tissue in the central portion of the wound seem to be getting worse. Subsequently the wound bed itself also seems to be getting deeper even compared to last week which is both unusual as well as concerning since  prior he had been shown signs of improvement. Nonetheless I think that the issue could be that he's actually having some difficulty in issues with a deeper infection. There's no external signs of infection but nonetheless I am more worried about the internal, osteomyelitis, that could be restarting. He has not been on antibiotics for some time at this point. I think that it may be a good idea to go ahead and started back on an antibiotic therapy while we wait to see what the testing shows. 12/27/18 on evaluation today patient presents for follow-up concerning his left gluteal fold wound. Fortunately he appears to be doing well today. I did review the CT scan which was negative for any signs of osteomyelitis or acute abnormality this is excellent news. Overall I feel like the surface of the wound bed appears to be doing significantly better today compared to previously noted findings. There does not appear any signs of infection nor does he have any pain at this time. 01/03/19 on evaluation today patient actually appears to be doing quite well in regard to his ulcer. Post debridement last week he really did not have too much bleeding which is good news. Fortunately today this seems to be doing some better but we still has some of the hyper granular tissue noted in the base of the wound which is gonna require sharp  debridement today as well. Overall I'm pleased with how things seem to be progressing since we switched away from the Wound VAC I think he is making some progress. 01/10/19 on evaluation today patient appears to be doing better in regard to his right gluteal fold ulcer. He has been tolerating the dressing changes without complication. The debridement to seem to be helping with current away some of the poor hyper granular tissue bugs throughout the region of his gluteal fold wound. He's been tolerating the dressing changes otherwise without complication which is great news. No fevers, chills, nausea, or vomiting noted at this time. 01/17/19 on evaluation today patient actually appears to be doing excellent in regard to his wound. He's been tolerating the dressing changes without complication. Fortunately there is no signs of active infection at this time which is great news. No fevers, chills, nausea, or vomiting noted at this time. 01/24/19 on evaluation today patient actually appears to be doing quite well with regard to his ulcer. He has been tolerating the dressing changes without complication. Fortunately there's no signs of active infection at this time. Overall been very pleased with the progress that he seems to be making currently. 01/31/19-Patient returns at 1 week with apparent similarity in dimensions to the wound, with no signs of infection, he has been [place] ([id number]) changing dressings twice a day Objective Constitutional alert and oriented x 3. sitting or standing blood pressure is within target range for patient.. supine blood pressure is within target range for patient.. pulse regular and within target range for patient.Marland Kitchen respirations regular, non-labored and within target range for patient.Marland Kitchen temperature within target range for patient.. Well-nourished and well-hydrated in no acute distress. Vitals Time Taken: 8:08 AM, Height: 73 in, Weight: 210 lbs, BMI: 27.7,  Temperature: 98.0 F, Pulse: 73 bpm, Respiratory Rate: 16 breaths/min, Blood Pressure: 145/79 mmHg. General Notes: Wound measures 4.5cm at 12 o clock and 2.4 cm at 6 oclock with healthy bridge of granulation on superior aspect, the margins are distinct, no necrotic areas noted Integumentary (Hair, Skin) Wound #1 status is Open. Original cause of wound was Pressure Injury.  The wound is located on the Right Gluteal fold. The wound measures 2.7cm length x 2.8cm width x 4cm depth; 5.938cm^2 area and 23.75cm^3 volume. There is muscle and Fat Layer (Subcutaneous Tissue) Exposed exposed. There is no tunneling or undermining noted. There is a large amount of serous drainage noted. The wound margin is epibole. There is large (67-100%) red, pink, hyper - granulation within the wound bed. There is a small (1-33%) amount of necrotic tissue within the wound bed including Adherent Slough. Plan Wound Cleansing: Wound #1 Right Gluteal fold: Cleanse wound with mild soap and water Anesthetic (add to Medication List): Wound #1 Right Gluteal fold: Topical Lidocaine 4% cream applied to wound bed prior to debridement (In Clinic Only). Skin Barriers/Peri-Wound Care: Wound #1 Right Gluteal fold: Other: - Apply zinc oxide to peri-wound area. Primary Wound Dressing: Wound #1 Right Gluteal fold: Silver Alginate - pack lightly into wound Secondary Dressing: Wound #1 Right Gluteal fold: ABD pad - Tegaderm to secure Dressing Change Frequency: Wound #1 Right Gluteal fold: Change dressing every day. Follow-up Appointments: Wound #1 Right Gluteal fold: BENJIMIN, HADDEN. ([id number]) Return Appointment in 1 week. Off-Loading: Wound #1 Right Gluteal fold: Turn and reposition every 2 hours Home Health: Wound #1 Right Gluteal fold: Continue Home Health Visits - PLEASE, have supplies for patient! Home Health Nurse may visit PRN to address patient s wound care needs. FACE TO FACE ENCOUNTER: MEDICARE and MEDICAID  PATIENTS: I certify that this patient is under my care and that I had a face-to-face encounter that meets the physician face-to-face encounter requirements with this patient on this date. The encounter with the patient was in whole or in part for the following MEDICAL CONDITION: (primary reason for [place]) MEDICAL NECESSITY: I certify, that based on my findings, NURSING services are a medically necessary home health service. HOME BOUND STATUS: I certify that my clinical findings support that this patient is homebound (i.e., Due to illness or injury, pt requires aid of supportive devices such as crutches, cane, wheelchairs, walkers, the use of special transportation or the assistance of another person to leave their place of residence. There is a normal inability to leave the home and doing so requires considerable and taxing effort. Other absences are for medical reasons / religious services and are infrequent or of short duration when for other reasons). If current dressing causes regression in wound condition, may D/C ordered dressing product/s and apply Normal Saline Moist Dressing daily until next [place] / Other MD appointment. [place] of regression in wound condition at [phone]. Please direct any NON-WOUND related issues/requests for orders to patient's Primary Care Physician 1. Continue with silver cell dressing as before 2. return to clinic 1 week 3. Mild degreee of maceration in periwound Electronic Signature(s) Signed: 01/31/2019 9:02:37 AM By: Tobi Bastos Entered By: Tobi Bastos on 01/31/2019 09:02:37 STRYDER, POITRA ([id number]) -------------------------------------------------------------------------------- SuperBill Details Patient Name: [name] Date of Service: 01/31/2019 Medical Record Number: [id number] Patient Account Number: [id number] Date of Birth/Sex: 27-Dec-1950 (68 y.o. 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

## 2019-02-07 ENCOUNTER — Encounter: Payer: Medicare Other | Admitting: Physician Assistant

## 2019-02-08 ENCOUNTER — Encounter: Payer: Medicare Other | Admitting: Physician Assistant

## 2019-02-08 ENCOUNTER — Other Ambulatory Visit: Payer: Self-pay

## 2019-02-08 DIAGNOSIS — L89314 Pressure ulcer of right buttock, stage 4: Secondary | ICD-10-CM | POA: Diagnosis not present

## 2019-02-09 NOTE — Progress Notes (Signed)
MARKAIL, [name] ([id number]) Visit Report for 02/08/2019 Chief Complaint Document Details Patient Name: [name], [name] Date of Service: 02/08/2019 8:00 AM Medical Record Number: [id number] Patient Account Number: [id number] Date of Birth/Sex: 1950-11-21 (68 y.o. M) Treating RN: Montey Hora Primary Care Provider: [name] Other Clinician: Referring Provider: [name] Treating Provider/Extender: [name], HOYT Weeks in Treatment: 14 Information Obtained from: Patient Chief Complaint Right gluteal fold ulcer Electronic Signature(s) Signed: 02/08/2019 6:14:16 PM By: Worthy Keeler [name] Entered By: Worthy Keeler on 02/08/2019 08:26:14 FERLANDO, LIA ([id number]) -------------------------------------------------------------------------------- HPI Details Patient Name: [name] Date of Service: 02/08/2019 8:00 AM Medical Record Number: [id number] Patient Account Number: [id number] Date of Birth/Sex: April 06, 1951 (68 y.o. 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 He states that there is no history of diabetes that he is aware of. He does have issues with his prostate and is currently receiving treatment for this by way of oral medication. With that being said I do not have a lot of details in that regard. Nonetheless the patient presents today as a result of having been referred to [place] by another provider initially home health was set to come out and take care of his wound although due to the fact that  he apparently drives he's not able to receive home health. His wife is therefore trying to help take care of this wound within although they have been struggling with what exactly to do at this point. She states that she can do some things but she is definitely not a nurse and does have some issues with looking at blood. The good news is the wound does not appear to be too deep and is fairly superficial at this point. There is no slough noted there is some nonviable skin noted around the surface of the wound and the perimeter at this point. The central portion of the wound appears to be very good with a dermal layer noted this does not appear to be again deep enough to extend it to subcutaneous tissue at this point. 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 In the pump he has Dilaudid and baclofen. 12/07/17 on evaluation today patient presents for follow-up concerning his ongoing lower back thigh ulcer on the right. He states that he did not get the supplies ordered and therefore has not really been able to perform the dressing changes as directed exactly. His wife was able to get some Boarder Foam Dressing's from the drugstore and subsequently has been using hydrogel which did help to a degree in the wound does appear to be able smaller. There is actually more drainage this week noted than previous. 12/21/17 on evaluation today patient appears to be doing rather well in regard to his right gluteal ulcer. He has been tolerating the dressing changes without complication. There does not appear to be any evidence of infection at this point in time. Overall the wound does seem to be making some progress as far as the edges  are concerned there's not as much in the way of overlapping of the external wound edges and he has a good epithelium to wound bed border for the most part. This however is not true  right at the 12 o'clock location over the span of a little over a centimeters which actually will require debridement today to clean this away and hopefully allow it to continue to heal more appropriately. 12/28/17 on evaluation today patient appears to be doing rather well in regard to his ulcer in the left gluteal region. He's been tolerating the dressing changes without complication. Apparently he has had some difficulty getting his dressing material. Apparently there's been some confusion with ordering we're gonna check into this. Nonetheless overall he's been showing signs of improvement which is good news. Debridement is not required today. 01/04/18 on evaluation today patient presents for follow-up concerning his right gluteal ulcer. He has been tolerating the dressing changes fairly well. On inspection today it appears he may actually have some maceration them concerned about the fact that he may be developing too much moisture in and around the wound bed which can cause delay in healing. With that being said he unfortunately really has not showed significant signs of improvement since last week's evaluation in fact this may even be just the little bit/slightly larger. Nonetheless he's been having a lot of discomfort I'm not sure this is even related to the wound as he has no pain when I'm to breeding or otherwise cleaning the wound during evaluation today. Nonetheless this is something that we did recommend he talked to his pain specialist concerning. 01/11/18 on evaluation today patient appears to be doing better in regard to his ulceration. He has been tolerating the dressing changes without complication. With that being said overall there's no evidence of infection which is good news. The only thing is he did receive the hatch affair blue classic versus the ready nonetheless I feel like this is perfectly fine and appears to have done well for him over the past week. 01/25/18 on evaluation  today patient's wound actually appears to be a little bit larger than during the last evaluation. The good SANG, BLOUNT. ([id number]) news is the majority of the wound edges actually appear to be fairly firmly attached to the wound bed unfortunately again we're not really making progress in regard to the size. Roughly the wound is about the same size as when I first saw him although again the wound margin/edges appear to be much better. 02/01/18 on evaluation today patient actually appears to be doing very well in regard to his wound. Applying the Prisma dry does seem to be better although he does still have issues with slow progression of the wound. There was a slight improvement compared to last week's measurements today. Nonetheless I have been considering other options as far as the possibility of Theraskin or even a snap vac. In general I'm not sure that the Theraskin due to location of the wound would be a very good idea. Nonetheless I do think that a snap vac could be a possibility for the patient and in fact I think this could even be an excellent way to manage the wound possibly seeing some improvement in a very rapid fashion here. Nonetheless this is something that we would need to get approved and I did have a lengthy conversation with the patient about this today. 02/08/18 on evaluation today patient appears to be doing a little better in regard to his ulcer.  He has been tolerating the dressing changes without complication. Fortunately despite the fact that the wound is a little bit smaller it's not significantly so unfortunately. We have discussed the possibility of a snap vac we did check with insurance this is actually covered at this point. Fortunately there does not appear to be any sign of infection. Overall I'm fairly pleased with how things seem to be appearing at this point. 02/15/18 on evaluation today patient appears to be doing rather well in regard to his right gluteal  ulcer. Unfortunately the snap vac did not stay in place with his sheer and friction this came loose and did not seem to maintain seal very well. He worked for about two days and it did seem to do very well during that time according to his wife but in general this does not seem to be something that's gonna be beneficial for him long-term. I do believe we need to go back to standard dressings to see if we can find something that will be of benefit. 03/02/18- He is here in follow up evaluation; there is minimal change in the wound. He will continue with the same treatment plan, would consider changing to iodosrob/iodoflex if ulcer continues to to plateau. He will follow up next week 03/08/18 on evaluation today patient's wound actually appears to be about the same size as when I previously saw him several weeks back. Unfortunately he does have some slightly dark discoloration in the central portion of the wound which has me concerned about pressure injury. I do believe he may be sitting for too long a period of time in fact he tells me that "I probably sit for much too long". He does have some Slough noted on the surface of the wound and again as far as the size of the wound is concerned I'm really not seeing anything that seems to have improved significantly. 03/15/18 on evaluation today patient appears to be doing fairly well in regard to his ulcer. The wound measured pretty much about the same today compared to last week's evaluation when looking at his graph. With that being said the area of bruising/deep tissue injury that was noted last week I do not see at this point. He did get a new cushion fortunately this does seem to be have been of benefit in my pinion. It does appear that he's been off of this more which is good news as well I think that is definitely showing in the overall wound measurements. With that being said I do believe that he needs to continue to offload I don't think that the fact  this is doing better should be or is going to allow him to not have to offload and explain this to him as well. Overall he seems to be in agreement the plan I think he understands. The overall appearance of the wound bed is improved compared to last week I think the Iodoflex has been beneficial in that regard. 03/29/18 on evaluation today patient actually appears to be doing rather well in regard to his wound from the overall appearance standpoint he does have some granulation although there's some Slough on the surface of the wound noted as well. With that being said he unfortunately has not improved in regard to the overall measurement of the wound in volume or in size. I did have a discussion with him very specifically about offloading today. He actually does work although he mainly is just sitting throughout the day. He tells me  he offloads by "lifting himself up for 30 seconds off of his chair occasionally" purchase from [employer] which does seem to have helped. And he has a new cushion that he with that being said he's also able to stand some for a very short period of time but not significant enough I think to provide appropriate offloading. I think the biggest issue at this point with the wound and the fact is not healing as quickly as we would like is due to the fact that he is really not able to appropriately offload while at work. He states the beginning after his injury he actually had a bed at his job that he could lay on in order to offload and that does seem to have been of help back at that time. Nonetheless he had not done this in quite some time unfortunately. I think that could be helpful for him this is something I would like for him to look into. 04/05/18 on evaluation today patient actually presents for follow-up concerning his right gluteal ulcer. Again he really is not significantly improved even compared to last week. He has been tolerating the dressing changes without  complication. With that being said fortunately there appears to be no evidence of infection at this time. He has been more proactive in trying to offload. CAREY, JOHNDROW ([id number]) 04/12/18 on evaluation today patient actually appears to be doing a little better in regard to his wound and the right gluteal fold region. He's been tolerating the dressing changes since removing the oasis without complication. However he was having a lot of burning initially with the oasis in place. He's unsure of exactly why this was given so much discomfort but he assumes that it was the oasis itself causing the problem. Nonetheless this had to be removed after about three days in place although even those three days seem to have made a fairly good improvement in regard to the overall appearance of the wound bed. In fact is the first time that he's made any improvement from the standpoint of measurements in about six weeks. He continues to have no discomfort over the area of the wound itself which leads me to wonder why he was having the burning with the oasis when he does not even feel the actual debridement's themselves. I am somewhat perplexed by this. 04/19/18 on evaluation today patient's wound actually appears to be showing signs of epithelialization around the edge of the wound and in general actually appears to be doing better which is good news. He did have the same burning after about three days with applying the Endoform last week in the same fashion that I would generally apply a skin substitute. This seems to indicate that it's not the oasis to cause the problem but potentially the moisture buildup that just causes things to burn or there may be some other reaction with the skin prep or Steri-Strips. Nonetheless I'm not sure that is gonna be able to tolerate any skin substitute for a long period of time. The good news is the wound actually appears to be doing better today compared to last week and  does seem to finally be making some progress. 04/26/18 on evaluation today patient actually appears to be doing rather well in regard to his ulcer in the right gluteal fold. He has been tolerating the dressing changes without complication which is good news. The Endoform does seem to be helping although he was a little bit more macerated this week. This  seems to be an ongoing issue with fluid control at this point. Nonetheless I think we may be able to add something like Drawtex to help control the drainage. 05/03/18 on evaluation today patient appears to actually be doing better in regard to the overall appearance of his wound. He has been tolerating the dressing changes without complication. Fortunately there appears to be no evidence of infection at this time. I really feel like his wound has shown signs as of today of turning around last week I thought so as well and definitely he could be seen in this week's overall appearance and measurements. In general I'm very pleased with the fact that he finally seems to be making a steady but sure progress. The patient likewise is very pleased. 05/17/18 on evaluation today patient appears to be doing more poorly unfortunately in regard to his ulcer. He has been tolerating the dressing changes without complication. With that being said he tells me that in the past couple of days he and his wife have noticed that we did not seem to be doing quite as well is getting dark near the center. Subsequently upon evaluation today the wound actually does appear to be doing worse compared to previous. He has been tolerating the dressing changes otherwise and he states that he is not been sitting up anymore than he was in the past from what he tells me. Still he has continued to work he states "I'm tired of dealing with this and if I have to just go home and lay in the bed all the time that's what I'll do". Nonetheless I am concerned about the fact that this wound does  appear to be deeper than what it was previous. 05/24/18 upon evaluation today patient actually presents after having been in the hospital due to what was presumed to be sepsis secondary to the wound infection. He had an elevated white blood cell count between 14 and 15. With that being said he does seem to be doing somewhat better now. His wound still is giving him some trouble nonetheless and he is obviously concerned about the fact likely talked about that this does seem to go more deeply than previously noted. I did review his wound culture which showed evidence of Staphylococcus aureus him and group B strep. Nonetheless he is on antibiotics, Levaquin, for this. Subsequently I did review his intake summary from the hospital as well. I also did look at the CT of the lumbar spine with contrast that was performed which showed no bone destruction to suggest lumbar disguises/osteomyelitis or sacral osteomyelitis. There was no paraspinal abscess. Nonetheless it appears this may have been more of just a soft tissue infection at this point which is good news. He still is nonetheless concerned about the wound which again I think is completely reasonable considering everything he's been through recently. 05/31/18 on evaluation today on evaluation today patient actually appears to be showing signs of his wound be a little bit deeper than what I would like to see. Fortunately he does not show any signs of significant infection although his temperature was 99 today he states he's been checking this at home and has not been elevated. Nonetheless with the undermining that I'm seeing at this point I am becoming more concerned about the wound I do think that offloading is a key factor here that is preventing the speedy recovery at this point. There does not appear to be any evidence of again over infection noted. He's been using  Santyl currently. 06/07/18 the patient presents today for follow-up evaluation  regarding the left ulcer in the gluteal region. He has been tolerating the Wound VAC fairly well. He is obviously very frustrated with this he states that to mean is really getting in his way. There does not appear to be any evidence of infection at this time he does have a little bit of odor I do not necessarily associate this with infection just something that we sometimes notice with Wound VAC therapy. With that being said I can definitely catch a tone of discontentment overall in the patient's demeanor today. This when he was previously in the hospital ARSHIA, RONDON. ([id number]) an CT scan was done of the lumbar region which did not reveal any signs of osteomyelitis. With that being said the pelvis in particular was not evaluated distinctly which means he could still have some osteonecrosis I. Nonetheless the Wound VAC was started on Thursday I do want to get this little bit more time before jumping to a CT scan of the pelvis although that is something that I might would recommend if were not see an improvement by that time. 06/14/18 on evaluation today patient actually appears to be doing about the same in regard to his right gluteal ulcer. Again he did have a CT scan of the lumbar spine unfortunately this did not include the pelvis. Nonetheless with the depth of the wound that I'm seeing today even despite the fact that I'm not seeing any evidence of overt cellulitis I believe there's a good chance that we may be dealing with osteomyelitis somewhere in the right Ischial region. No fevers, chills, nausea, or vomiting noted at this time. 06/21/18 on evaluation today patient actually appears to be doing about the same with regard to his wound. The tunnel at 6 o'clock really does not appear to be any deeper although it is a little bit wider. I think at this point you may want to start packing this with white phone. Unfortunately I have not got approval for the CT scan of the pelvis as of yet  due to the fact that Medicare apparently has been denied it due to the diagnosis codes not being appropriate according to Medicare for the test requested. With that being said the patient cannot have an MRI and therefore this is the only option that we have as far as testing is concerned. The patient has had infection and was on antibiotics and been added code for cellulitis of the bottom to see if this will be appropriate for getting the test approved. Nonetheless I'm concerned about the infection have been spread deeper into the Ischial region. 06/28/18 on evaluation today patient actually appears to be doing rather well all things considered in regard to the right gluteal ulcer. He has been tolerating the dressing changes without complication. With that being said the Wound VAC he states does have to be replaced almost every day or at least reinforced unfortunately. Patient actually has his CT scan later this morning we should have the results by tomorrow. 07/05/18 on evaluation today patient presents for follow-up concerning his right Ischial ulcer. He did see the surgeon Dr. Lysle Pearl last week. They were actually very happy with him and felt like he spent a tremendous amount of time with them as far as discussing his situation was concerned. In the end Dr. Lysle Pearl did contact me as well and determine that he would not recommend any surgical intervention at this point as he felt like  it would not be in the patient's best interest based on what he was seeing. He recommended a referral to infectious disease. Subsequently this is something that Dr. Ines Bloomer office is working on setting up for the patient. As far as evaluation today is concerned the patient's wound actually appears to be worse at this point. I am concerned about how things are progressing and specifically about infection. I do not feel like it's the deeper but the area of depth is definitely widening which does have me concerned. No  fevers, chills, nausea, or vomiting noted at this time. I think that we do need initiate antibiotic therapy the patient has an allow allergy to amoxicillin/penicillin he states that he gets a rash since childhood. Nonetheless she's never had the issues with Catholics or cephalosporins in general but he is aware of. 07/27/18 on evaluation today patient presents following admission to the hospital on 07/09/18. He was subsequently discharged on 07/20/18. On 07/15/18 the patient underwent irrigation and debridement was soft tissue biopsy and bone biopsy as well as placement of a Wound VAC in the OR by Dr. Celine Ahr. During the hospital course the patient was placed on a Wound VAC and recommended follow up with surgery in three weeks actually with Dr. Delaine Lame who is infectious disease. The patient was on vancomycin during the hospital course. 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 He also had a bone culture which revealed evidence of methicillin-resistant staph aureus. He is updated CT scan 07/09/18 reveals that he had progression of the which was performed on wound to breakdown down to the trochanter where he actually had irregularities there as well suggestive of osteomyelitis. This was a change just since 9 December when we last performed a CT scan. Obviously this one had gone downhill quite significantly and rapidly. At this point upon evaluation I feel like in general the patient's wound seems to be doing fairly well all things considered upon my evaluation today. Obviously this is larger and deeper than what I previously evaluated but at the same time he seems to be making some progress as far as the appearance of the granulation tissue is concerned. I'm happy in that regard. No fevers, chills, nausea, or vomiting noted at this time. He is on IV vancomycin and Rocephin at the facility. He is currently in [employer]. 08/03/18 upon evaluation today  patient's wound appears to be doing better in regard to the overall appearance at this point in time. Fortunately he's been tolerating the Wound VAC without complication and states that the facility has been taking excellent care of the wound site. Overall I see some Slough noted on the surface which I am going to attempt sharp debridement today of but nonetheless other than this I feel like he's making progress. 08/09/18 on evaluation today patient's wound appears to be doing much better compared to even last week's evaluation. Do believe that the Wound VAC is been of great benefit for him. He has been tolerating the dressing changes that is the Wound BRAVEN, WOLK. ([id number]) VAC without any complication and he has excellent granulation noted currently. There is no need for sharp debridement at this point. 08/16/18 on evaluation today patient actually appears to be doing very well in regard to the wound in the right gluteal fold region. This is showing signs of progress and again appears to be very healthy which is excellent news. Fortunately there is no sign of active infection by way of odor  or drainage at this point. Overall I'm very pleased with how things stand. He seems to be tolerating the Wound VAC without complication. 08/23/18 on evaluation today patient actually appears to be doing better in regard to his wound. He has been tolerating the Wound VAC without complication and in fact it has been collecting a significant amount of drainage which I think is good news especially considering how the wound appears. Fortunately there is no signs of infection at this time definitely nothing appears to be worse which is good news. He has not been started on the Bactrim and Flagyl that was recommended by Dr. Delaine Lame yet. I did actually contact her office this morning in order to check and see were things are that regard their gonna be calling me back. 08/30/18 on evaluation today patient  actually appears to show signs of excellent improvement today compared to last evaluation. The undermining is getting much better the wound seems to be feeling quite nicely and I'm very pleased that the granulation in general. With that being said overall I feel like the patient has made excellent progress which is great news. No fevers, chills, nausea, or vomiting noted at this time. 09/06/18 on evaluation today patient actually appears to be doing rather well in regard to his right gluteal ulcer. This is showing signs of improvement in overall I'm very pleased with how things seem to be progressing. The patient likewise is please. Overall I see no evidence of infection he is about to complete his oral antibiotic regimen which is the end of the antibiotics for him in just about three days. 09/13/18 on evaluation today patient's right Ischial ulcer appears to be showing signs of continued improvement which is excellent news. He's been tolerating the dressing changes without complication. Fortunately there's no signs of infection and the wound that seems to be doing very well. 09/28/18 on evaluation today patient appears to be doing rather well in regard to his right Ischial ulcer. He's been tolerating the Wound VAC without complication he knows there's much less drainage than there used to be this obviously is not a bad thing in my pinion. There's no evidence of infection despite the fact is but nothing about it now for several weeks. 10/04/18 on evaluation today patient appears to be doing better in regard to his right Ischial wound. He has been tolerating the Wound VAC without complication and I do believe that the silver nitrate last week was beneficial for him. Fortunately overall there's no evidence of active infection at this time which is great news. No fevers, chills, nausea, or vomiting noted at this time. 10/11/18 on evaluation today patient actually appears to be doing rather well in regard  to his Ischial ulcer. He's been tolerating the Wound VAC still without complication I feel like this is doing a good job. No fevers, chills, nausea, or vomiting noted at this time. 11/01/18 on evaluation today patient presents after having not been seen in our clinic for several weeks secondary to the fact that he was on evaluation today patient presents after having not been seen in our clinic for several weeks secondary to the fact that he was in a skilled nursing facility which was on lockdown currently due to the covert 19 national emergency. Subsequently he was discharged from the facility on this past Friday and subsequently made an appointment to come in to see yesterday. Fortunately there's no signs of active infection at this time which is good news and overall he does  seem to have made progress since I last saw. Overall I feel like things are progressing quite nicely. The patient is having no pain. 11/08/18 on evaluation today patient appears to be doing okay in regard to his right gluteal ulcer. He has been utilizing a Wound VAC home health this changing this at this point since he's home from the skilled nursing facility. Fortunately there's no signs of obvious active infection at this time. Unfortunately though there's no obvious active infection he is having some maceration and his wife states that when the sheets of the Wound VAC office on Sunday when it broke seal that he ended up having significant issues with some smell as well there concerned about the possibility of infection. Fortunately there's No fevers, chills, nausea, or vomiting noted at this time. 11/15/18 on evaluation today patient actually appears to be doing well in regard to his right gluteal ulcer. He has been tolerating the dressing changes without complication. Specifically the Wound VAC has been utilized up to this point. Fortunately there's no signs of infection and overall I feel like he has made progress even  since last week when I last saw him. I'm actually fairly happy with the overall appearance although he does seem to have somewhat of a hyper granular Dorion, Rodric J. ([id number]) overgrowth in the central portion of the wound which I think may require some sharp debridement to try flatness out possibly utilizing chemical cauterization following. 11/23/18 on evaluation today patient actually appears to be doing very well in regard to his sacral ulcer. He seems to be showing signs of improvement with good granulation. With that being said he still has the small area of hyper granulation right in the central portion of the wound which I'm gonna likely utilize silver nitrate on today. Subsequently he also keeps having a leak at the 6 o'clock location which is unfortunate we may be able to help out with some suggestions to try to prevent this going forward. Fortunately there's no signs of active infection at this time. 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 He's been tolerating the dressing changes without complication. Fortunately there's no signs of active infection at this time. I've been rather pleased with how things have progressed there still some evidence of pressure getting to the area with some redness right around the immediate wound opening. Nonetheless other than this I'm not seeing any significant complications or issues the wound is somewhat hyper granular. Upon discussing with the patient and his wife today I'm not sure that the wound is being packed to the base with the foam at this point. And if it's not been packed fully that may be part of the reason why is not seen as much improvement as far as the granulation from the base out. Again we do not want pack too tightly but we need some of the firm to get to the base of the wound. I discussed this with patient and his wife  today. 12/06/18 on evaluation today patient appears to be doing well in regard to his right gluteal pressure ulcer. He's been tolerating the dressing changes without complication. Fortunately there's no signs of active infection. He still has some hyper granular tissue and I do think it would be appropriate to continue with the chemical cauterization as of today. 12/16/18 on evaluation today patient actually appears to be doing okay in regard to his right  gluteal ulcer. He is been tolerating the dressing changes without complication including the Wound VAC. Overall I feel like nothing seems to be worsening I do feel like that the hyper granulation buds in the central portion of the wound have improved to some degree with the silver nitrate. We will have to see how things continue to progress. 12/20/18 on evaluation today patient actually appears to be doing much worse in my pinion even compared to last week's evaluation. Unfortunately as opposed to showing any signs of improvement the areas of hyper granular tissue in the central portion of the wound seem to be getting worse. Subsequently the wound bed itself also seems to be getting deeper even compared to last week which is both unusual as well as concerning since prior he had been shown signs of improvement. Nonetheless I think that the issue could be that he's actually having some difficulty in issues with a deeper infection. There's no external signs of infection but nonetheless I am more worried about the internal, osteomyelitis, that could be restarting. He has not been on antibiotics for some time at this point. I think that it may be a good idea to go ahead and started back on an antibiotic therapy while we wait to see what the testing shows. 12/27/18 on evaluation today patient presents for follow-up concerning his left gluteal fold wound. Fortunately he appears to be doing well today. I did review the CT scan which was negative for any signs of  osteomyelitis or acute abnormality this is excellent news. Overall I feel like the surface of the wound bed appears to be doing significantly better today compared to previously noted findings. There does not appear any signs of infection nor does he have any pain at this time. 01/03/19 on evaluation today patient actually appears to be doing quite well in regard to his ulcer. Post debridement last week he really did not have too much bleeding which is good news. Fortunately today this seems to be doing some better but we still has some of the hyper granular tissue noted in the base of the wound which is gonna require sharp debridement today as well. Overall I'm pleased with how things seem to be progressing since we switched away from the Wound VAC I think he is making some progress. 01/10/19 on evaluation today patient appears to be doing better in regard to his right gluteal fold ulcer. He has been tolerating the dressing changes without complication. The debridement to seem to be helping with current away some of the poor hyper granular tissue bugs throughout the region of his gluteal fold wound. He's been tolerating the dressing changes otherwise without complication which is great news. No fevers, chills, nausea, or vomiting noted at this time. 01/17/19 on evaluation today patient actually appears to be doing excellent in regard to his wound. He's been tolerating the dressing changes without complication. Fortunately there is no signs of active infection at this time which is great news. No fevers, chills, nausea, or vomiting noted at this time. 01/24/19 on evaluation today patient actually appears to be doing quite well with regard to his ulcer. He has been tolerating the dressing changes without complication. Fortunately there's no signs of active infection at this time. Overall been very pleased with the progress that he seems to be making currently. 01/31/19-Patient returns at 1 week with  apparent similarity in dimensions to the wound, with no signs of infection, he has been [place] ([id number]) changing dressings twice  a day 02/08/19 upon evaluation today patient actually appears to be doing well with regard to his right Ischial ulcer. The wound is not appear to be quite as deep and seems to be making progress which is good news. With that being said I'm still reluctant to go back to the Wound VAC at this point. He's been having to change the dressings twice a day which is a little bit much in my pinion from the wound care supplies standpoint. I think that possibly attempting to utilize extras orbit may be beneficial this may also help to prevent any additional breakdown secondary to fluid retention in the wound itself. The patient is in agreement with giving this a try. Electronic Signature(s) Signed: 02/08/2019 6:14:16 PM By: Worthy Keeler [name] Entered By: Worthy Keeler on 02/08/2019 09:21:48 ZAHKI, HOOGENDOORN ([id number]) -------------------------------------------------------------------------------- Physical Exam Details Patient Name: [name] Date of Service: 02/08/2019 8:00 AM Medical Record Number: [id number] Patient Account Number: [id number] Date of Birth/Sex: 09/13/1950 (68 y.o. M) Treating RN: Montey Hora Primary Care Provider: [name] Other Clinician: Referring Provider: [name] Treating Provider/Extender: [name], HOYT Weeks in Treatment: 59 Constitutional Well-nourished and well-hydrated in no acute distress. Respiratory normal breathing without difficulty. clear to auscultation bilaterally. Cardiovascular regular rate and rhythm with normal S1, S2. Psychiatric this patient is able to make decisions and demonstrates good insight into disease process. Alert and Oriented x 3. pleasant and cooperative. Notes Upon inspection today no we debridement was necessary which was good news. With that being said the  wound doesn't appear to be quite as deep and I do feel like she's making some progress. With that being said this is definitely slow progress but still better than when you have the Wound VAC. I'm somewhat reluctant to go back to the Wound VAC due to the fact that again he didn't seem to do nearly as well with that. At least not in the outpatient setting. Electronic Signature(s) Signed: 02/08/2019 6:14:16 PM By: Worthy Keeler [name] Entered By: Worthy Keeler on 02/08/2019 09:22:23 TABER, SWEETSER ([id number]) -------------------------------------------------------------------------------- Physician Orders Details Patient Name: [name] Date of Service: 02/08/2019 8:00 AM Medical Record Number: [id number] Patient Account Number: [id number] Date of Birth/Sex: May 06, 1951 (68 y.o. 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 Skin Barriers/Peri-Wound Care Wound #1 Right Gluteal fold o Other: - Apply zinc oxide to peri-wound area. Primary Wound Dressing Wound #1 Right Gluteal fold o Silver Alginate - pack lightly into wound Secondary Dressing Wound #1 Right Gluteal fold o XtraSorb - Please order this brand specifically for absorption and to protect the peri wound area. Secure  with Tegaderm Dressing Change Frequency Wound #1 Right Gluteal fold o Change dressing every day. Follow-up Appointments Wound #1 Right Gluteal fold o Return Appointment in 1 week. Off-Loading Wound #1 Right Gluteal fold o Turn and reposition every 2 hours RACE, LATOUR ([id number]) Home Health Wound #  1 Right Gluteal fold o Continue Home Health Visits - PLEASE, have supplies for patient! o Home Health Nurse may visit PRN to address patientos wound care needs. o FACE TO FACE ENCOUNTER: MEDICARE and MEDICAID PATIENTS: I certify that this patient is under my care and that I had a face-to-face encounter that meets the physician face-to-face encounter requirements with this patient on this date. The encounter with the patient was in whole or in part for the following MEDICAL CONDITION: (primary reason for [place]) MEDICAL NECESSITY: I certify, that based on my findings, NURSING services are a medically necessary home health service. HOME BOUND STATUS: I certify that my clinical findings support that this patient is homebound (i.e., Due to illness or injury, pt requires aid of supportive devices such as crutches, cane, wheelchairs, walkers, the use of special transportation or the assistance of another person to leave their place of residence. There is a normal inability to leave the home and doing so requires considerable and taxing effort. Other absences are for medical reasons / religious services and are infrequent or of short duration when for other reasons). o If current dressing causes regression in wound condition, may D/C ordered dressing product/s and apply Normal Saline Moist Dressing daily until next [place] / Other MD appointment. [place] of regression in wound condition at [phone]. o Please direct any NON-WOUND related issues/requests for orders to patient's Primary Care Physician Electronic Signature(s) Signed:  02/08/2019 4:59:24 PM By: Montey Hora Signed: 02/08/2019 6:14:16 PM By: Worthy Keeler [name] Entered By: Montey Hora on 02/08/2019 08:33:18 BUBBA, VANBENSCHOTEN ([id number]) -------------------------------------------------------------------------------- Problem List Details Patient Name: [name] Date of Service: 02/08/2019 8:00 AM Medical Record Number: [id number] Patient Account Number: [id number] Date of Birth/Sex: [birth year] (68 y.o. M) Treating RN: Montey Hora Primary Care Provider: [name] Other Clinician: Referring Provider: [name] Treating Provider/Extender: [name], HOYT Weeks in Treatment: 66 Active Problems ICD-10 Evaluated Encounter Code Description Active Date Today Diagnosis L89.314 Pressure ulcer of right buttock, stage 4 11/30/2017 No Yes L03.317 Cellulitis of buttock 06/21/2018 No Yes G82.20 Paraplegia, unspecified 11/30/2017 No Yes S34.109S Unspecified injury to unspecified level of lumbar spinal cord, 11/30/2017 No Yes sequela I10 Essential (primary) hypertension 11/30/2017 No Yes Inactive Problems Resolved Problems Electronic Signature(s) Signed: 02/08/2019 6:14:16 PM By: Worthy Keeler [name] Entered By: Worthy Keeler on 02/08/2019 08:26:08 MILBERN, DOESCHER ([id number]) -------------------------------------------------------------------------------- Progress Note Details Patient Name: [name] Date of Service: 02/08/2019 8:00 AM Medical Record Number: [id number] Patient Account Number: [id number] Date of Birth/Sex: 09-16-1950 (68 y.o. 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 He states that there is no history of diabetes that he is aware of. He does have issues with his prostate and is currently receiving treatment for this by way of oral medication. With that being said I do not have a lot of details in that regard. Nonetheless the patient presents today as a result of having been referred to [place] by another provider initially home health was set  to come out and take care of his wound although due to the fact that he apparently drives he's not able to receive home health. His wife is therefore trying to help take care of this wound within although they have been struggling with what exactly to do at this point. She states that she can do some things but she is definitely not a nurse and does have some issues with looking at blood. The good news is the wound does not appear to be too deep and is fairly superficial at this point. There is no slough noted there is some nonviable skin noted around the surface of the wound and the perimeter at this point. The central portion of the wound appears to be very good with a dermal layer noted this does not appear to be again deep enough to extend it to subcutaneous tissue at this point. 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 In the pump he has Dilaudid and baclofen. 12/07/17 on evaluation today patient presents for follow-up concerning his ongoing lower back thigh ulcer on the right. He states that he did not get the supplies ordered and therefore has not really been able to perform the dressing changes as directed exactly. His wife was able to get some Boarder Foam Dressing's from the drugstore and subsequently has been using  hydrogel which did help to a degree in the wound does appear to be able smaller. There is actually more drainage this week noted than previous. 12/21/17 on evaluation today patient appears to be doing rather well in regard to his right gluteal ulcer. He has been tolerating the dressing changes without complication. There does not appear to be any evidence of infection at this point in time. Overall the wound does seem to be making some progress as far as the edges are concerned there's not as much in the way of overlapping of the external wound edges and he has a good epithelium to wound bed border for the most part. This however is not true right at the 12 o'clock location over the span of a little over a centimeters which actually will require debridement today to clean this away and hopefully allow it to continue to heal more appropriately. 12/28/17 on evaluation today patient appears to be doing rather well in regard to his ulcer in the left gluteal region. He's been tolerating the dressing changes without complication. Apparently he has had some difficulty getting his dressing material. Apparently there's been some confusion with ordering we're gonna check into this. Nonetheless overall he's been showing signs of improvement which is good news. Debridement is not required today. 01/04/18 on evaluation today patient presents for follow-up concerning his right gluteal ulcer. He has been tolerating the dressing changes fairly well. On inspection today it appears he may actually have some maceration them concerned about the fact that he may be developing too much moisture in and around the wound bed which can cause delay in healing. With that being said he unfortunately really has not showed significant signs of improvement since last week's evaluation in fact this may even be just the little bit/slightly larger. Nonetheless he's been having a lot of discomfort I'm not sure this is even related to the  wound as he has no pain when I'm to breeding or otherwise cleaning the wound during evaluation today. Nonetheless this is something that we did recommend  he talked to his pain specialist concerning. 01/11/18 on evaluation today patient appears to be doing better in regard to his ulceration. He has been tolerating the dressing Kilbride, Kingdavid J. ([id number]) changes without complication. With that being said overall there's no evidence of infection which is good news. The only thing is he did receive the hatch affair blue classic versus the ready nonetheless I feel like this is perfectly fine and appears to have done well for him over the past week. 01/25/18 on evaluation today patient's wound actually appears to be a little bit larger than during the last evaluation. The good news is the majority of the wound edges actually appear to be fairly firmly attached to the wound bed unfortunately again we're not really making progress in regard to the size. Roughly the wound is about the same size as when I first saw him although again the wound margin/edges appear to be much better. 02/01/18 on evaluation today patient actually appears to be doing very well in regard to his wound. Applying the Prisma dry does seem to be better although he does still have issues with slow progression of the wound. There was a slight improvement compared to last week's measurements today. Nonetheless I have been considering other options as far as the possibility of Theraskin or even a snap vac. In general I'm not sure that the Theraskin due to location of the wound would be a very good idea. Nonetheless I do think that a snap vac could be a possibility for the patient and in fact I think this could even be an excellent way to manage the wound possibly seeing some improvement in a very rapid fashion here. Nonetheless this is something that we would need to get approved and I did have a lengthy conversation with the patient  about this today. 02/08/18 on evaluation today patient appears to be doing a little better in regard to his ulcer. He has been tolerating the dressing changes without complication. Fortunately despite the fact that the wound is a little bit smaller it's not significantly so unfortunately. We have discussed the possibility of a snap vac we did check with insurance this is actually covered at this point. Fortunately there does not appear to be any sign of infection. Overall I'm fairly pleased with how things seem to be appearing at this point. 02/15/18 on evaluation today patient appears to be doing rather well in regard to his right gluteal ulcer. Unfortunately the snap vac did not stay in place with his sheer and friction this came loose and did not seem to maintain seal very well. He worked for about two days and it did seem to do very well during that time according to his wife but in general this does not seem to be something that's gonna be beneficial for him long-term. I do believe we need to go back to standard dressings to see if we can find something that will be of benefit. 03/02/18- He is here in follow up evaluation; there is minimal change in the wound. He will continue with the same treatment plan, would consider changing to iodosrob/iodoflex if ulcer continues to to plateau. He will follow up next week 03/08/18 on evaluation today patient's wound actually appears to be about the same size as when I previously saw him several weeks back. Unfortunately he does have some slightly dark discoloration in the central portion of the wound which has me concerned about pressure injury. I do believe he may be sitting  for too long a period of time in fact he tells me that "I probably sit for much too long". He does have some Slough noted on the surface of the wound and again as far as the size of the wound is concerned I'm really not seeing anything that seems to have improved significantly. 03/15/18  on evaluation today patient appears to be doing fairly well in regard to his ulcer. The wound measured pretty much about the same today compared to last week's evaluation when looking at his graph. With that being said the area of bruising/deep tissue injury that was noted last week I do not see at this point. He did get a new cushion fortunately this does seem to be have been of benefit in my pinion. It does appear that he's been off of this more which is good news as well I think that is definitely showing in the overall wound measurements. With that being said I do believe that he needs to continue to offload I don't think that the fact this is doing better should be or is going to allow him to not have to offload and explain this to him as well. Overall he seems to be in agreement the plan I think he understands. The overall appearance of the wound bed is improved compared to last week I think the Iodoflex has been beneficial in that regard. 03/29/18 on evaluation today patient actually appears to be doing rather well in regard to his wound from the overall appearance standpoint he does have some granulation although there's some Slough on the surface of the wound noted as well. With that being said he unfortunately has not improved in regard to the overall measurement of the wound in volume or in size. I did have a discussion with him very specifically about offloading today. He actually does work although he mainly is just sitting throughout the day. He tells me he offloads by "lifting himself up for 30 seconds off of his chair occasionally" purchase from [employer] which does seem to have helped. And he has a new cushion that he with that being said he's also able to stand some for a very short period of time but not significant enough I think to provide appropriate offloading. I think the biggest issue at this point with the wound and the fact is not healing as quickly as we would like is  due to the fact that he is really not able to appropriately offload while at work. He states the beginning after his injury he actually had a bed at his job that he could lay on in order to offload and that does seem to have been of help back at that time. Nonetheless he had not done this in quite some time unfortunately. I think that could be helpful for him this is something I would like for him to look into. SANI, MADARIAGA ([id number]) 04/05/18 on evaluation today patient actually presents for follow-up concerning his right gluteal ulcer. Again he really is not significantly improved even compared to last week. He has been tolerating the dressing changes without complication. With that being said fortunately there appears to be no evidence of infection at this time. He has been more proactive in trying to offload. 04/12/18 on evaluation today patient actually appears to be doing a little better in regard to his wound and the right gluteal fold region. He's been tolerating the dressing changes since removing the oasis without complication. However he was  having a lot of burning initially with the oasis in place. He's unsure of exactly why this was given so much discomfort but he assumes that it was the oasis itself causing the problem. Nonetheless this had to be removed after about three days in place although even those three days seem to have made a fairly good improvement in regard to the overall appearance of the wound bed. In fact is the first time that he's made any improvement from the standpoint of measurements in about six weeks. He continues to have no discomfort over the area of the wound itself which leads me to wonder why he was having the burning with the oasis when he does not even feel the actual debridement's themselves. I am somewhat perplexed by this. 04/19/18 on evaluation today patient's wound actually appears to be showing signs of epithelialization around the edge of  the wound and in general actually appears to be doing better which is good news. He did have the same burning after about three days with applying the Endoform last week in the same fashion that I would generally apply a skin substitute. This seems to indicate that it's not the oasis to cause the problem but potentially the moisture buildup that just causes things to burn or there may be some other reaction with the skin prep or Steri-Strips. Nonetheless I'm not sure that is gonna be able to tolerate any skin substitute for a long period of time. The good news is the wound actually appears to be doing better today compared to last week and does seem to finally be making some progress. 04/26/18 on evaluation today patient actually appears to be doing rather well in regard to his ulcer in the right gluteal fold. He has been tolerating the dressing changes without complication which is good news. The Endoform does seem to be helping although he was a little bit more macerated this week. This seems to be an ongoing issue with fluid control at this point. Nonetheless I think we may be able to add something like Drawtex to help control the drainage. 05/03/18 on evaluation today patient appears to actually be doing better in regard to the overall appearance of his wound. He has been tolerating the dressing changes without complication. Fortunately there appears to be no evidence of infection at this time. I really feel like his wound has shown signs as of today of turning around last week I thought so as well and definitely he could be seen in this week's overall appearance and measurements. In general I'm very pleased with the fact that he finally seems to be making a steady but sure progress. The patient likewise is very pleased. 05/17/18 on evaluation today patient appears to be doing more poorly unfortunately in regard to his ulcer. He has been tolerating the dressing changes without complication. With  that being said he tells me that in the past couple of days he and his wife have noticed that we did not seem to be doing quite as well is getting dark near the center. Subsequently upon evaluation today the wound actually does appear to be doing worse compared to previous. He has been tolerating the dressing changes otherwise and he states that he is not been sitting up anymore than he was in the past from what he tells me. Still he has continued to work he states "I'm tired of dealing with this and if I have to just go home and lay in the bed all the  time that's what I'll do". Nonetheless I am concerned about the fact that this wound does appear to be deeper than what it was previous. 05/24/18 upon evaluation today patient actually presents after having been in the hospital due to what was presumed to be sepsis secondary to the wound infection. He had an elevated white blood cell count between 14 and 15. With that being said he does seem to be doing somewhat better now. His wound still is giving him some trouble nonetheless and he is obviously concerned about the fact likely talked about that this does seem to go more deeply than previously noted. I did review his wound culture which showed evidence of Staphylococcus aureus him and group B strep. Nonetheless he is on antibiotics, Levaquin, for this. Subsequently I did review his intake summary from the hospital as well. I also did look at the CT of the lumbar spine with contrast that was performed which showed no bone destruction to suggest lumbar disguises/osteomyelitis or sacral osteomyelitis. There was no paraspinal abscess. Nonetheless it appears this may have been more of just a soft tissue infection at this point which is good news. He still is nonetheless concerned about the wound which again I think is completely reasonable considering everything he's been through recently. 05/31/18 on evaluation today on evaluation today patient actually  appears to be showing signs of his wound be a little bit deeper than what I would like to see. Fortunately he does not show any signs of significant infection although his temperature was 99 today he states he's been checking this at home and has not been elevated. Nonetheless with the undermining that I'm seeing at this point I am becoming more concerned about the wound I do think that offloading is a key factor here that is preventing the speedy recovery at this point. There does not appear to be any evidence of again over infection noted. He's been using Santyl currently. AMARA, MANALANG ([id number]) 06/07/18 the patient presents today for follow-up evaluation regarding the left ulcer in the gluteal region. He has been tolerating the Wound VAC fairly well. He is obviously very frustrated with this he states that to mean is really getting in his way. There does not appear to be any evidence of infection at this time he does have a little bit of odor I do not necessarily associate this with infection just something that we sometimes notice with Wound VAC therapy. With that being said I can definitely catch a tone of discontentment overall in the patient's demeanor today. This when he was previously in the hospital an CT scan was done of the lumbar region which did not reveal any signs of osteomyelitis. With that being said the pelvis in particular was not evaluated distinctly which means he could still have some osteonecrosis I. Nonetheless the Wound VAC was started on Thursday I do want to get this little bit more time before jumping to a CT scan of the pelvis although that is something that I might would recommend if were not see an improvement by that time. 06/14/18 on evaluation today patient actually appears to be doing about the same in regard to his right gluteal ulcer. Again he did have a CT scan of the lumbar spine unfortunately this did not include the pelvis. Nonetheless with the  depth of the wound that I'm seeing today even despite the fact that I'm not seeing any evidence of overt cellulitis I believe there's a good chance that we may  be dealing with osteomyelitis somewhere in the right Ischial region. No fevers, chills, nausea, or vomiting noted at this time. 06/21/18 on evaluation today patient actually appears to be doing about the same with regard to his wound. The tunnel at 6 o'clock really does not appear to be any deeper although it is a little bit wider. I think at this point you may want to start packing this with white phone. Unfortunately I have not got approval for the CT scan of the pelvis as of yet due to the fact that Medicare apparently has been denied it due to the diagnosis codes not being appropriate according to Medicare for the test requested. With that being said the patient cannot have an MRI and therefore this is the only option that we have as far as testing is concerned. The patient has had infection and was on antibiotics and been added code for cellulitis of the bottom to see if this will be appropriate for getting the test approved. Nonetheless I'm concerned about the infection have been spread deeper into the Ischial region. 06/28/18 on evaluation today patient actually appears to be doing rather well all things considered in regard to the right gluteal ulcer. He has been tolerating the dressing changes without complication. With that being said the Wound VAC he states does have to be replaced almost every day or at least reinforced unfortunately. Patient actually has his CT scan later this morning we should have the results by tomorrow. 07/05/18 on evaluation today patient presents for follow-up concerning his right Ischial ulcer. He did see the surgeon Dr. Lysle Pearl last week. They were actually very happy with him and felt like he spent a tremendous amount of time with them as far as discussing his situation was concerned. In the end Dr. Lysle Pearl  did contact me as well and determine that he would not recommend any surgical intervention at this point as he felt like it would not be in the patient's best interest based on what he was seeing. He recommended a referral to infectious disease. Subsequently this is something that Dr. Ines Bloomer office is working on setting up for the patient. As far as evaluation today is concerned the patient's wound actually appears to be worse at this point. I am concerned about how things are progressing and specifically about infection. I do not feel like it's the deeper but the area of depth is definitely widening which does have me concerned. No fevers, chills, nausea, or vomiting noted at this time. I think that we do need initiate antibiotic therapy the patient has an allow allergy to amoxicillin/penicillin he states that he gets a rash since childhood. Nonetheless she's never had the issues with Catholics or cephalosporins in general but he is aware of. 07/27/18 on evaluation today patient presents following admission to the hospital on 07/09/18. He was subsequently discharged on 07/20/18. On 07/15/18 the patient underwent irrigation and debridement was soft tissue biopsy and bone biopsy as well as placement of a Wound VAC in the OR by Dr. Celine Ahr. During the hospital course the patient was placed on a Wound VAC and recommended follow up with surgery in three weeks actually with Dr. Delaine Lame who is infectious disease. The patient was on vancomycin during the hospital course. 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 He also had a bone culture which revealed evidence of methicillin-resistant staph aureus. He is updated CT scan 07/09/18 reveals that he had progression of the which was performed  on wound to breakdown down to the trochanter where he actually had irregularities there as well suggestive of osteomyelitis. This was a change just since 9 December when we  last performed a CT scan. Obviously this one had gone downhill quite significantly and rapidly. At this point upon evaluation I feel like in general the patient's wound seems to be doing fairly well all things considered upon my evaluation today. Obviously this is larger and deeper than what I previously evaluated but at the same time he seems to be making some progress as far as the appearance of the granulation tissue is concerned. I'm happy in that regard. No fevers, chills, nausea, or vomiting noted at this time. He is on IV vancomycin and Rocephin at the facility. He is currently in [facility]. 08/03/18 upon evaluation today patient's wound appears to be doing better in regard to the overall appearance at this point in time. Fortunately he's been tolerating the Wound VAC without complication and states that the facility has been taking ASTON, LIESKE. ([id number]) excellent care of the wound site. Overall I see some Slough noted on the surface which I am going to attempt sharp debridement today of but nonetheless other than this I feel like he's making progress. 08/09/18 on evaluation today patient's wound appears to be doing much better compared to even last week's evaluation. Do believe that the Wound VAC is been of great benefit for him. He has been tolerating the dressing changes that is the Wound VAC without any complication and he has excellent granulation noted currently. There is no need for sharp debridement at this point. 08/16/18 on evaluation today patient actually appears to be doing very well in regard to the wound in the right gluteal fold region. This is showing signs of progress and again appears to be very healthy which is excellent news. Fortunately there is no sign of active infection by way of odor or drainage at this point. Overall I'm very pleased with how things stand. He seems to be tolerating the Wound VAC without complication. 08/23/18 on evaluation today  patient actually appears to be doing better in regard to his wound. He has been tolerating the Wound VAC without complication and in fact it has been collecting a significant amount of drainage which I think is good news especially considering how the wound appears. Fortunately there is no signs of infection at this time definitely nothing appears to be worse which is good news. He has not been started on the Bactrim and Flagyl that was recommended by Dr. Delaine Lame yet. I did actually contact her office this morning in order to check and see were things are that regard their gonna be calling me back. 08/30/18 on evaluation today patient actually appears to show signs of excellent improvement today compared to last evaluation. The undermining is getting much better the wound seems to be feeling quite nicely and I'm very pleased that the granulation in general. With that being said overall I feel like the patient has made excellent progress which is great news. No fevers, chills, nausea, or vomiting noted at this time. 09/06/18 on evaluation today patient actually appears to be doing rather well in regard to his right gluteal ulcer. This is showing signs of improvement in overall I'm very pleased with how things seem to be progressing. The patient likewise is please. Overall I see no evidence of infection he is about to complete his oral antibiotic regimen which is the end of the antibiotics for  him in just about three days. 09/13/18 on evaluation today patient's right Ischial ulcer appears to be showing signs of continued improvement which is excellent news. He's been tolerating the dressing changes without complication. Fortunately there's no signs of infection and the wound that seems to be doing very well. 09/28/18 on evaluation today patient appears to be doing rather well in regard to his right Ischial ulcer. He's been tolerating the Wound VAC without complication he knows there's much less  drainage than there used to be this obviously is not a bad thing in my pinion. There's no evidence of infection despite the fact is but nothing about it now for several weeks. 10/04/18 on evaluation today patient appears to be doing better in regard to his right Ischial wound. He has been tolerating the Wound VAC without complication and I do believe that the silver nitrate last week was beneficial for him. Fortunately overall there's no evidence of active infection at this time which is great news. No fevers, chills, nausea, or vomiting noted at this time. 10/11/18 on evaluation today patient actually appears to be doing rather well in regard to his Ischial ulcer. He's been tolerating the Wound VAC still without complication I feel like this is doing a good job. No fevers, chills, nausea, or vomiting noted at this time. 11/01/18 on evaluation today patient presents after having not been seen in our clinic for several weeks secondary to the fact that he was on evaluation today patient presents after having not been seen in our clinic for several weeks secondary to the fact that he was in a skilled nursing facility which was on lockdown currently due to the covert 19 national emergency. Subsequently he was discharged from the facility on this past Friday and subsequently made an appointment to come in to see yesterday. Fortunately there's no signs of active infection at this time which is good news and overall he does seem to have made progress since I last saw. Overall I feel like things are progressing quite nicely. The patient is having no pain. 11/08/18 on evaluation today patient appears to be doing okay in regard to his right gluteal ulcer. He has been utilizing a Wound VAC home health this changing this at this point since he's home from the skilled nursing facility. Fortunately there's no signs of obvious active infection at this time. Unfortunately though there's no obvious active infection he  is having some maceration and his wife states that when the sheets of the Wound VAC office on Sunday when it broke seal that he ended up having significant issues with some smell as well there concerned about the possibility of infection. Fortunately there's No fevers, chills, nausea, or vomiting noted at this time. ARRAN, FESSEL ([id number]) 11/15/18 on evaluation today patient actually appears to be doing well in regard to his right gluteal ulcer. He has been tolerating the dressing changes without complication. Specifically the Wound VAC has been utilized up to this point. Fortunately there's no signs of infection and overall I feel like he has made progress even since last week when I last saw him. I'm actually fairly happy with the overall appearance although he does seem to have somewhat of a hyper granular overgrowth in the central portion of the wound which I think may require some sharp debridement to try flatness out possibly utilizing chemical cauterization following. 11/23/18 on evaluation today patient actually appears to be doing very well in regard to his sacral ulcer. He seems to be  showing signs of improvement with good granulation. With that being said he still has the small area of hyper granulation right in the central portion of the wound which I'm gonna likely utilize silver nitrate on today. Subsequently he also keeps having a leak at the 6 o'clock location which is unfortunate we may be able to help out with some suggestions to try to prevent this going forward. Fortunately there's no signs of active infection at this time. 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 He's been tolerating the dressing changes without complication. Fortunately there's no signs of active infection at this time. I've been rather pleased with how things have progressed there still some evidence of  pressure getting to the area with some redness right around the immediate wound opening. Nonetheless other than this I'm not seeing any significant complications or issues the wound is somewhat hyper granular. Upon discussing with the patient and his wife today I'm not sure that the wound is being packed to the base with the foam at this point. And if it's not been packed fully that may be part of the reason why is not seen as much improvement as far as the granulation from the base out. Again we do not want pack too tightly but we need some of the firm to get to the base of the wound. I discussed this with patient and his wife today. 12/06/18 on evaluation today patient appears to be doing well in regard to his right gluteal pressure ulcer. He's been tolerating the dressing changes without complication. Fortunately there's no signs of active infection. He still has some hyper granular tissue and I do think it would be appropriate to continue with the chemical cauterization as of today. 12/16/18 on evaluation today patient actually appears to be doing okay in regard to his right gluteal ulcer. He is been tolerating the dressing changes without complication including the Wound VAC. Overall I feel like nothing seems to be worsening I do feel like that the hyper granulation buds in the central portion of the wound have improved to some degree with the silver nitrate. We will have to see how things continue to progress. 12/20/18 on evaluation today patient actually appears to be doing much worse in my pinion even compared to last week's evaluation. Unfortunately as opposed to showing any signs of improvement the areas of hyper granular tissue in the central portion of the wound seem to be getting worse. Subsequently the wound bed itself also seems to be getting deeper even compared to last week which is both unusual as well as concerning since prior he had been shown signs of improvement. Nonetheless I  think that the issue could be that he's actually having some difficulty in issues with a deeper infection. There's no external signs of infection but nonetheless I am more worried about the internal, osteomyelitis, that could be restarting. He has not been on antibiotics for some time at this point. I think that it may be a good idea to go ahead and started back on an antibiotic therapy while we wait to see what the testing shows. 12/27/18 on evaluation today patient presents for follow-up concerning his left gluteal fold wound. Fortunately he appears to be doing well today. I did review the CT scan which was negative for any signs of osteomyelitis or acute abnormality this is excellent news. Overall I feel like the surface  of the wound bed appears to be doing significantly better today compared to previously noted findings. There does not appear any signs of infection nor does he have any pain at this time. 01/03/19 on evaluation today patient actually appears to be doing quite well in regard to his ulcer. Post debridement last week he really did not have too much bleeding which is good news. Fortunately today this seems to be doing some better but we still has some of the hyper granular tissue noted in the base of the wound which is gonna require sharp debridement today as well. Overall I'm pleased with how things seem to be progressing since we switched away from the Wound VAC I think he is making some progress. 01/10/19 on evaluation today patient appears to be doing better in regard to his right gluteal fold ulcer. He has been tolerating the dressing changes without complication. The debridement to seem to be helping with current away some of the poor hyper granular tissue bugs throughout the region of his gluteal fold wound. He's been tolerating the dressing changes otherwise without complication which is great news. No fevers, chills, nausea, or vomiting noted at this time. 01/17/19 on evaluation  today patient actually appears to be doing excellent in regard to his wound. He's been tolerating the dressing changes without complication. Fortunately there is no signs of active infection at this time which is great news. No fevers, chills, nausea, or vomiting noted at this time. GUENTHER, DUNSHEE ([id number]) 01/24/19 on evaluation today patient actually appears to be doing quite well with regard to his ulcer. He has been tolerating the dressing changes without complication. Fortunately there's no signs of active infection at this time. Overall been very pleased with the progress that he seems to be making currently. 01/31/19-Patient returns at 1 week with apparent similarity in dimensions to the wound, with no signs of infection, he has been changing dressings twice a day 02/08/19 upon evaluation today patient actually appears to be doing well with regard to his right Ischial ulcer. The wound is not appear to be quite as deep and seems to be making progress which is good news. With that being said I'm still reluctant to go back to the Wound VAC at this point. He's been having to change the dressings twice a day which is a little bit much in my pinion from the wound care supplies standpoint. I think that possibly attempting to utilize extras orbit may be beneficial this may also help to prevent any additional breakdown secondary to fluid retention in the wound itself. The patient is in agreement with giving this a try. Patient History Information obtained from Patient. Family History Hypertension - Father, Stroke - Mother, No family history of Cancer, Diabetes, Heart Disease, Kidney Disease, Lung Disease, Seizures, Thyroid Problems, Tuberculosis. Social History Never smoker, Marital Status - Married, Alcohol Use - Never, Drug Use - No History, Caffeine Use - Daily. Medical History Eyes Patient has history of Cataracts - both removed Denies history of Glaucoma, Optic  Neuritis Ear/Nose/Mouth/Throat Denies history of Chronic sinus problems/congestion, Middle ear problems Hematologic/Lymphatic Denies history of Anemia, Hemophilia, Human Immunodeficiency Virus, Lymphedema Respiratory Denies history of Aspiration, Asthma, Chronic Obstructive Pulmonary Disease (COPD), Pneumothorax, Sleep Apnea, Tuberculosis Cardiovascular Patient has history of Hypertension - takes medication Denies history of Angina, Arrhythmia, Congestive Heart Failure, Coronary Artery Disease, Deep Vein Thrombosis, Hypotension, Myocardial Infarction, Peripheral Arterial Disease, Peripheral Venous Disease, Phlebitis, Vasculitis Gastrointestinal Denies history of Cirrhosis , Colitis, Crohn s, Hepatitis A,  Hepatitis B, Hepatitis C Endocrine Denies history of Type I Diabetes, Type II Diabetes Genitourinary Denies history of End Stage Renal Disease Immunological Denies history of Lupus Erythematosus, Raynaud s, Scleroderma Integumentary (Skin) Denies history of History of Burn, History of pressure wounds Musculoskeletal Denies history of Gout, Rheumatoid Arthritis, Osteoarthritis, Osteomyelitis Neurologic Patient has history of Paraplegia - waist down Denies history of Dementia, Neuropathy, Quadriplegia, Seizure Disorder Oncologic Denies history of Received Chemotherapy, Received Radiation Psychiatric Denies history of Anorexia/bulimia, Confinement Anxiety AVYUKTH, BONTEMPO ([id number]) Medical And Surgical History Notes Oncologic Prostate cancer- currently treated with horomone therapy Review of Systems (ROS) Constitutional Symptoms (General Health) Denies complaints or symptoms of Fatigue, Fever, Chills, Marked Weight Change. Respiratory Denies complaints or symptoms of Chronic or frequent coughs, Shortness of Breath. Cardiovascular Denies complaints or symptoms of Chest pain, LE edema. Psychiatric Denies complaints or symptoms of Anxiety,  Claustrophobia. Objective Constitutional Well-nourished and well-hydrated in no acute distress. Vitals Time Taken: 8:10 AM, Height: 73 in, Weight: 210 lbs, BMI: 27.7, Temperature: 97.9 F, Pulse: 73 bpm, Respiratory Rate: 16 breaths/min, Blood Pressure: 138/78 mmHg. Respiratory normal breathing without difficulty. clear to auscultation bilaterally. Cardiovascular regular rate and rhythm with normal S1, S2. Psychiatric this patient is able to make decisions and demonstrates good insight into disease process. Alert and Oriented x 3. pleasant and cooperative. General Notes: Upon inspection today no we debridement was necessary which was good news. With that being said the wound doesn't appear to be quite as deep and I do feel like she's making some progress. With that being said this is definitely slow progress but still better than when you have the Wound VAC. I'm somewhat reluctant to go back to the Wound VAC due to the fact that again he didn't seem to do nearly as well with that. At least not in the outpatient setting. Integumentary (Hair, Skin) Wound #1 status is Open. Original cause of wound was Pressure Injury. The wound is located on the Right Gluteal fold. The wound measures 3.5cm length x 2cm width x 3cm depth; 5.498cm^2 area and 16.493cm^3 volume. There is muscle and Fat Layer (Subcutaneous Tissue) Exposed exposed. There is no tunneling or undermining noted. There is a large amount of serous drainage noted. The wound margin is epibole. There is large (67-100%) red, pink, hyper - granulation within the wound bed. There is a small (1-33%) amount of necrotic tissue within the wound bed including Adherent Slough. 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 Skin Barriers/Peri-Wound Care: Wound #1 Right Gluteal fold: Other: - Apply zinc oxide to peri-wound area. Primary Wound Dressing: Wound #1 Right Gluteal fold: Silver Alginate - pack lightly into wound Secondary Dressing: Wound #1 Right Gluteal fold: XtraSorb - Please order this brand specifically for absorption and to protect the peri wound area. Secure with Tegaderm Dressing Change Frequency: Wound #1 Right Gluteal fold: Change dressing every day. Follow-up Appointments: Wound #1 Right Gluteal fold: Return Appointment in 1 week. Off-Loading: Wound #1 Right Gluteal fold: Turn and reposition every 2 hours Home Health: Wound #1 Right Gluteal fold: Continue Home Health Visits - PLEASE, have supplies for patient!  Home Health Nurse may visit PRN to address patient s wound care needs. FACE TO FACE ENCOUNTER: MEDICARE and MEDICAID PATIENTS: I certify that this patient is under my care and that I had a face-to-face encounter that meets the physician face-to-face encounter requirements with this patient on this date. The encounter with the patient was in whole or in part for the following MEDICAL CONDITION: (primary reason for [place]) MEDICAL NECESSITY: I certify, that based on my findings, NURSING services are a medically necessary home health service. HOME BOUND STATUS: I certify that my clinical findings support that this patient is homebound (i.e., Due to illness or injury, pt requires aid of supportive devices such as crutches, cane, wheelchairs, walkers, the use of special transportation or the assistance of another person to leave their place of residence. There is a normal inability to leave the home and doing so requires considerable and taxing  effort. Other absences are for medical reasons / religious services and are infrequent or of short duration when for other reasons). If current dressing causes regression in wound condition, may D/C ordered dressing product/s and apply Normal Saline Moist Dressing daily until next [place] / Other MD appointment. [place] of regression in wound condition at [phone]. Please direct any NON-WOUND related issues/requests for orders to patient's Primary Care Physician JACOBB, ALEN ([id number]) I'm in a recommend that we go ahead and continue with the above wound care measures for the next week and the patient is in agreement with plan. We will subsequently see were things stand at follow-up. Thinking changes or worsens meantime he will contact the office and let me know. Please see above for specific wound care orders. We will see patient for re-evaluation in 1 week(s) here in the clinic. If anything worsens or changes patient will contact our office for additional recommendations. Electronic Signature(s) Signed: 02/08/2019 6:14:16 PM By: Worthy Keeler [name] Entered By: Worthy Keeler on 02/08/2019 09:22:43 MOUHAMADOU, GITTLEMAN ([id number]) -------------------------------------------------------------------------------- ROS/PFSH Details Patient Name: [name] Date of Service: 02/08/2019 8:00 AM Medical Record Number: [id number] Patient Account Number: [id number] Date of Birth/Sex: 10-Nov-1950 (68 y.o. M) Treating RN: Montey Hora Primary Care Provider: [name] Other Clinician: Referring Provider: [name] Treating Provider/Extender: [name], HOYT Weeks in Treatment: 27 Information Obtained From Patient Constitutional Symptoms (General Health) Complaints and Symptoms: Negative for: Fatigue; Fever; Chills; Marked Weight Change Respiratory Complaints and Symptoms: Negative for: Chronic or frequent coughs; Shortness of  Breath Medical History: Negative for: Aspiration; Asthma; Chronic Obstructive Pulmonary Disease (COPD); Pneumothorax; Sleep Apnea; Tuberculosis Cardiovascular Complaints and Symptoms: Negative for: Chest pain; LE edema Medical History: Positive for: Hypertension - takes medication Negative for: Angina; Arrhythmia; Congestive Heart Failure; Coronary Artery Disease; Deep Vein Thrombosis; Hypotension; Myocardial Infarction; Peripheral Arterial Disease; Peripheral Venous Disease; Phlebitis; Vasculitis Psychiatric Complaints and Symptoms: Negative for: Anxiety; Claustrophobia Medical History: Negative for: Anorexia/bulimia; Confinement Anxiety Eyes Medical History: Positive for: Cataracts - both removed Negative for: Glaucoma; Optic Neuritis Ear/Nose/Mouth/Throat Medical History: Negative for: Chronic sinus problems/congestion; Middle ear problems Hematologic/Lymphatic Medical History: Negative for: Anemia; Hemophilia; Human Immunodeficiency Virus; Lymphedema KAYDIN, KARBOWSKI. ([id number]) Gastrointestinal Medical History: Negative for: Cirrhosis ; Colitis; Crohnos; Hepatitis A; Hepatitis B; Hepatitis C Endocrine Medical History: Negative for: Type I Diabetes; Type II Diabetes Genitourinary Medical History: Negative for: End Stage Renal Disease Immunological Medical History: Negative for: Lupus Erythematosus; Raynaudos; Scleroderma Integumentary (Skin) Medical History: Negative for: History of Burn; History of pressure wounds Musculoskeletal Medical History:  Negative for: Gout; Rheumatoid Arthritis; Osteoarthritis; Osteomyelitis Neurologic Medical History: Positive for: Paraplegia - waist down Negative for: Dementia; Neuropathy; Quadriplegia; Seizure Disorder Oncologic Medical History: Negative for: Received Chemotherapy; Received Radiation Past Medical History Notes: Prostate cancer- currently treated with horomone therapy HBO Extended History  Items Eyes: Cataracts Immunizations Pneumococcal Vaccine: Received Pneumococcal Vaccination: No Implantable Devices No devices added Family and Social History Cancer: No; Diabetes: No; Heart Disease: No; Hypertension: Yes - Father; Kidney Disease: No; Lung Disease: No; Seizures: No; Stroke: Yes - Mother; Thyroid Problems: No; Tuberculosis: No; Never smoker; Marital Status - Married; Alcohol Use: Never; Drug Use: No History; Caffeine Use: Daily LESLIE, LANGILLE ([id number]) Physician Affirmation I have reviewed and agree with the above information. Electronic Signature(s) Signed: 02/08/2019 4:59:24 PM By: Montey Hora Signed: 02/08/2019 6:14:16 PM By: Worthy Keeler [name] Entered By: Worthy Keeler on 02/08/2019 09:22:05 JACARRI, GESNER ([id number]) -------------------------------------------------------------------------------- SuperBill Details Patient Name: [name] Date of Service: 02/08/2019 Medical Record Number: [id number] Patient Account Number: [id number] Date of Birth/Sex: Feb 26, 1951 (68 y.o. 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, sequela I10 Essential (primary) hypertension Facility Procedures CPT4 Code: [id number] Description: 99213 - WOUND CARE VISIT-LEV 3 EST PT Modifier: Quantity: 1 Physician Procedures CPT4 Code: [id number] Description: 30131 - WC PHYS LEVEL 4 - EST PT ICD-10 Diagnosis Description L89.314 Pressure ulcer of right buttock, stage 4 L03.317 Cellulitis of buttock G82.20 Paraplegia, unspecified S34.109S Unspecified injury to unspecified level of lumbar  spinal cor Modifier: d, sequela Quantity: 1 Electronic Signature(s) Signed: 02/08/2019 6:14:16 PM By: Worthy Keeler [name] Entered By: Worthy Keeler on 02/08/2019 09:23:20

## 2019-02-09 NOTE — Progress Notes (Signed)
[name] ([id number]) Visit Report for 02/08/2019 Arrival Information Details Patient Name: [name] Date of Service: 02/08/2019 8:00 AM Medical Record Number: [id number] Patient Account Number: [id number] Date of Birth/Sex: 08-20-1950 (68 y.o. M) Treating RN: Montey Hora Primary Care [place] Current: [name] Other Clinician: Referring Kahlie Deutscher: [name] Treating Ashlynd Michna/Extender: [name], HOYT Weeks in Treatment: 52 Visit Information History Since Last Visit Added or deleted any medications: No Patient Arrived: Wheel Chair Any new allergies or adverse reactions: No Arrival Time: 08:09 Had a fall or experienced change in No Accompanied By: wife activities of daily living that may affect Transfer Assistance: None risk of falls: Patient Identification Verified: Yes Signs or symptoms of abuse/neglect since last visito No Secondary Verification Process Completed: Yes Hospitalized since last visit: No Patient Requires Transmission-Based No Implantable device outside of the clinic excluding No Precautions: cellular tissue based products placed in the center Patient Has Alerts: Yes since last visit: Patient Alerts: NOT Has Dressing in Place as Prescribed: Yes Diabetic Pain Present Now: Yes Electronic Signature(s) Signed: 02/08/2019 11:14:01 AM By: Lorine Bears RCP, RRT, CHT Entered By: Lorine Bears on 02/08/2019 08:10:28 [name] ([id number]) -------------------------------------------------------------------------------- Clinic Level of Care Assessment Details Patient Name: [name] Date of Service: 02/08/2019 8:00 AM Medical Record Number: [id number] Patient Account Number: [id number] Date of Birth/Sex: 1951-07-14 (68 y.o. M) Treating RN: Montey Hora Primary Care Damien Cisar: [name] Other Clinician: Referring Onix Jumper: [name] Treating Twanisha Foulk/Extender: [name],  HOYT Weeks in Treatment: 51 Clinic Level of Care Assessment Items TOOL 4 Quantity Score []  - Use when only an EandM is performed on FOLLOW-UP visit 0 ASSESSMENTS - Nursing Assessment / Reassessment X - Reassessment of Co-morbidities (includes updates in patient status) 1 10 X- 1 5 Reassessment of Adherence to Treatment Plan ASSESSMENTS - Wound and Skin Assessment / Reassessment X - Simple Wound Assessment / Reassessment - one wound 1 5 []  - 0 Complex Wound Assessment / Reassessment - multiple wounds []  - 0 Dermatologic / Skin Assessment (not related to wound area) ASSESSMENTS - Focused Assessment []  - Circumferential Edema Measurements - multi extremities 0 []  - 0 Nutritional Assessment / Counseling / Intervention []  - 0 Lower Extremity Assessment (monofilament, tuning fork, pulses) []  - 0 Peripheral Arterial Disease Assessment (using hand held doppler) ASSESSMENTS - Ostomy and/or Continence Assessment and Care []  - Incontinence Assessment and Management 0 []  - 0 Ostomy Care Assessment and Management (repouching, etc.) PROCESS - Coordination of Care X - Simple Patient / Family Education for ongoing care 1 15 []  - 0 Complex (extensive) Patient / Family Education for ongoing care X- 1 10 Staff obtains Programmer, systems, Records, Test Results / Process Orders []  - 0 Staff telephones HHA, Nursing Homes / Clarify orders / etc []  - 0 Routine Transfer to another Facility (non-emergent condition) []  - 0 Routine Hospital Admission (non-emergent condition) []  - 0 New Admissions / Biomedical engineer / Ordering NPWT, Apligraf, etc. []  - 0 Emergency Hospital Admission (emergent condition) X- 1 10 Simple Discharge Coordination TYTUS, STRAHLE. ([id number]) []  - 0 Complex (extensive) Discharge Coordination PROCESS - Special Needs []  - Pediatric / Minor Patient Management 0 []  - 0 Isolation Patient Management []  - 0 Hearing / Language / Visual special needs []  - 0 Assessment of  Community assistance (transportation, D/C planning, etc.) []  - 0 Additional assistance / Altered mentation []  - 0 Support Surface(s) Assessment (bed, cushion, seat, etc.) INTERVENTIONS - Wound Cleansing / Measurement X - Simple Wound Cleansing -  one wound 1 5 []  - 0 Complex Wound Cleansing - multiple wounds X- 1 5 Wound Imaging (photographs - any number of wounds) []  - 0 Wound Tracing (instead of photographs) X- 1 5 Simple Wound Measurement - one wound []  - 0 Complex Wound Measurement - multiple wounds INTERVENTIONS - Wound Dressings X - Small Wound Dressing one or multiple wounds 1 10 []  - 0 Medium Wound Dressing one or multiple wounds []  - 0 Large Wound Dressing one or multiple wounds []  - 0 Application of Medications - topical []  - 0 Application of Medications - injection INTERVENTIONS - Miscellaneous []  - External ear exam 0 []  - 0 Specimen Collection (cultures, biopsies, blood, body fluids, etc.) []  - 0 Specimen(s) / Culture(s) sent or taken to Lab for analysis []  - 0 Patient Transfer (multiple staff / Civil Service fast streamer / Similar devices) []  - 0 Simple Staple / Suture removal (25 or less) []  - 0 Complex Staple / Suture removal (26 or more) []  - 0 Hypo / Hyperglycemic Management (close monitor of Blood Glucose) []  - 0 Ankle / Brachial Index (ABI) - do not check if billed separately X- 1 5 Vital Signs Sisemore, Jabriel J. ([id number]) Has the patient been seen at the hospital within the last three years: Yes Total Score: 85 Level Of Care: New/Established - Level 3 Electronic Signature(s) Signed: 02/08/2019 4:59:24 PM By: Montey Hora Entered By: Montey Hora on 02/08/2019 08:33:45 [name] ([id number]) -------------------------------------------------------------------------------- Encounter Discharge Information Details Patient Name: [name] Date of Service: 02/08/2019 8:00 AM Medical Record Number: [id number] Patient Account Number:  [id number] Date of Birth/Sex: [birth year] (68 y.o. M) Treating RN: Montey Hora Primary Care Jibril Mcminn: [name] Other Clinician: Referring Theophile Harvie: [name] Treating Krystal Teachey/Extender: [name], HOYT Weeks in Treatment: 58 Encounter Discharge Information Items Discharge Condition: Stable Ambulatory Status: Wheelchair Discharge Destination: Home Transportation: Private Auto Accompanied By: wife Schedule Follow-up Appointment: Yes Clinical Summary of Care: Electronic Signature(s) Signed: 02/08/2019 4:59:24 PM By: Montey Hora Entered By: Montey Hora on 02/08/2019 08:35:57 [name] ([id number]) -------------------------------------------------------------------------------- Lower Extremity Assessment Details Patient Name: [name] Date of Service: 02/08/2019 8:00 AM Medical Record Number: [id number] Patient Account Number: [id number] Date of Birth/Sex: 26-Oct-1950 (68 y.o. M) Treating RN: Army Melia Primary Care Vincenzina Jagoda: [name] Other Clinician: Referring Marilene Vath: [name] Treating Samanthan Dugo/Extender: [name], HOYT Weeks in Treatment: 62 Electronic Signature(s) Signed: 02/08/2019 10:23:44 AM By: Army Melia Entered By: Army Melia on 02/08/2019 08:21:20 KALEN, RATAJCZAK ([id number]) -------------------------------------------------------------------------------- Multi Wound Chart Details Patient Name: [name] Date of Service: 02/08/2019 8:00 AM Medical Record Number: [id number] Patient Account Number: [id number] Date of Birth/Sex: 1951/05/19 (68 y.o. M) Treating RN: Montey Hora Primary Care Savian Mazon: [name] Other Clinician: Referring Khylan Sawyer: [name] Treating Brittie Whisnant/Extender: [name], HOYT Weeks in Treatment: 62 Vital Signs Height(in): 73 Pulse(bpm): 73 Weight(lbs): 210 Blood Pressure(mmHg): 138/78 Body Mass Index(BMI): 28 Temperature(F): 97.9 Respiratory  Rate 16 (breaths/min): Photos: [N/A:N/A] Wound Location: Right Gluteal fold N/A N/A Wounding Event: Pressure Injury N/A N/A Primary Etiology: Pressure Ulcer N/A N/A Comorbid History: Cataracts, Hypertension, N/A N/A Paraplegia Date Acquired: 11/02/2017 N/A N/A Weeks of Treatment: 62 N/A N/A Wound Status: Open N/A N/A Measurements L x W x D 3.5x2x3 N/A N/A (cm) Area (cm) : 5.498 N/A N/A Volume (cm) : 16.493 N/A N/A % Reduction in Area: 45.30% N/A N/A % Reduction in Volume: -1541.10% N/A N/A Classification: Category/Stage IV N/A N/A Exudate Amount: Large N/A N/A Exudate Type: Serous N/A N/A Exudate  Color: amber N/A N/A Wound Margin: Epibole N/A N/A Granulation Amount: Large (67-100%) N/A N/A Granulation Quality: Red, Pink, Hyper-granulation N/A N/A Necrotic Amount: Small (1-33%) N/A N/A Exposed Structures: Fat Layer (Subcutaneous N/A N/A Tissue) Exposed: Yes Muscle: Yes Fascia: No Tendon: No Joint: No Bone: No WILLMAN, CUNY ([id number]) Epithelialization: None N/A N/A Treatment Notes Electronic Signature(s) Signed: 02/08/2019 4:59:24 PM By: Montey Hora Entered By: Montey Hora on 02/08/2019 57:84:69 [name] ([id number]) -------------------------------------------------------------------------------- [facility] Details Patient Name: [name] Date of Service: 02/08/2019 8:00 AM Medical Record Number: [id number] Patient Account Number: [id number] Date of Birth/Sex: 1950-10-22 (68 y.o. M) Treating RN: Montey Hora Primary Care Nirali Magouirk: [name] Other Clinician: Referring Koby Hartfield: [name] Treating Linnet Bottari/Extender: [name], HOYT Weeks in Treatment: 78 Active Inactive Orientation to the Wound Care Program Nursing Diagnoses: Knowledge deficit related to the wound healing center program Goals: Patient/caregiver will verbalize understanding of the [facility] Program Date Initiated:  11/30/2017 Target Resolution Date: 12/21/2017 Goal Status: Active Interventions: Provide education on orientation to the wound center Notes: Pressure Nursing Diagnoses: Knowledge deficit related to management of pressures ulcers Goals: Patient/caregiver will verbalize understanding of pressure ulcer management Date Initiated: 05/17/2018 Target Resolution Date: 05/28/2018 Goal Status: Active Interventions: Assess: immobility, friction, shearing, incontinence upon admission and as needed Notes: Wound/Skin Impairment Nursing Diagnoses: Impaired tissue integrity Goals: Patient/caregiver will verbalize understanding of skin care regimen Date Initiated: 11/30/2017 Target Resolution Date: 12/21/2017 Goal Status: Active Ulcer/skin breakdown will have a volume reduction of 30% by week 4 Date Initiated: 11/30/2017 Target Resolution Date: 12/21/2017 Goal Status: Active DIONNE, ROSSA ([id number]) Interventions: Assess patient/caregiver ability to obtain necessary supplies Assess patient/caregiver ability to perform ulcer/skin care regimen upon admission and as needed Assess ulceration(s) every visit Treatment Activities: Skin care regimen initiated : 11/30/2017 Notes: Electronic Signature(s) Signed: 02/08/2019 4:59:24 PM By: Montey Hora Entered By: Montey Hora on 02/08/2019 08:28:13 [name] ([id number]) -------------------------------------------------------------------------------- Pain Assessment Details Patient Name: [name] Date of Service: 02/08/2019 8:00 AM Medical Record Number: [id number] Patient Account Number: [id number] Date of Birth/Sex: Feb 29, 1951 (68 y.o. M) Treating RN: Montey Hora Primary Care Aldrin Engelhard: [name] Other Clinician: Referring Tereasa Yilmaz: [name] Treating Tyree Fluharty/Extender: [name], HOYT Weeks in Treatment: 32 Active Problems Location of Pain Severity and Description of Pain Patient Has Paino Yes Site  Locations Rate the pain. Current Pain Level: 4 Pain Management and Medication Current Pain Management: Electronic Signature(s) Signed: 02/08/2019 11:14:01 AM By: Lorine Bears RCP, RRT, CHT Signed: 02/08/2019 4:59:24 PM By: Montey Hora Entered By: Lorine Bears on 02/08/2019 08:10:42 ALYN, JURNEY ([id number]) -------------------------------------------------------------------------------- Patient/Caregiver Education Details Patient Name: [name] Date of Service: 02/08/2019 8:00 AM Medical Record Number: [id number] Patient Account Number: [id number] Date of Birth/Gender: 27-Aug-1950 (68 y.o. M) Treating RN: Montey Hora Primary Care Physician: [name] Other Clinician: Referring Physician: [name] Treating Physician/Extender: Sharalyn Ink in Treatment: 54 Education Assessment Education Provided To: Patient and Caregiver Education Topics Provided Wound/Skin Impairment: Handouts: Other: wound care as ordered Methods: Demonstration, Explain/Verbal Responses: State content correctly Electronic Signature(s) Signed: 02/08/2019 4:59:24 PM By: Montey Hora Entered By: Montey Hora on 02/08/2019 08:34:13 [name] ([id number]) -------------------------------------------------------------------------------- Wound Assessment Details Patient Name: [name] Date of Service: 02/08/2019 8:00 AM Medical Record Number: [id number] Patient Account Number: [id number] Date of Birth/Sex: Nov 21, 1950 (68 y.o. M) Treating RN: Army Melia Primary Care Aydon Swamy: [name] Other Clinician: Referring Maurisa Tesmer: [name] Treating Tonji Elliff/Extender: [name], HOYT Weeks  in Treatment: 62 Wound Status Wound Number: 1 Primary Etiology: Pressure Ulcer Wound Location: Right Gluteal fold Wound Status: Open Wounding Event: Pressure Injury Comorbid History: Cataracts, Hypertension,  Paraplegia Date Acquired: 11/02/2017 Weeks Of Treatment: 62 Clustered Wound: No Photos Wound Measurements Length: (cm) 3.5 Width: (cm) 2 Depth: (cm) 3 Area: (cm) 5.498 Volume: (cm) 16.493 % Reduction in Area: 45.3% % Reduction in Volume: -1541.1% Epithelialization: None Tunneling: No Undermining: No Wound Description Classification: Category/Stage IV Foul Odo Wound Margin: Epibole Slough/F Exudate Amount: Large Exudate Type: Serous Exudate Color: amber r After Cleansing: No ibrino Yes Wound Bed Granulation Amount: Large (67-100%) Exposed Structure Granulation Quality: Red, Pink, Hyper-granulation Fascia Exposed: No Necrotic Amount: Small (1-33%) Fat Layer (Subcutaneous Tissue) Exposed: Yes Necrotic Quality: Adherent Slough Tendon Exposed: No Muscle Exposed: Yes Necrosis of Muscle: No Joint Exposed: No Bone Exposed: No Treatment Notes DIONISIOS, RICCI. ([id number]) Wound #1 (Right Gluteal fold) Notes zinc to peri wound, silvercel, xtrasorb and tegaderm Electronic Signature(s) Signed: 02/08/2019 10:23:44 AM By: Army Melia Entered By: Army Melia on 02/08/2019 08:20:49 [name] ([id number]) -------------------------------------------------------------------------------- Vitals Details Patient Name: [name] Date of Service: 02/08/2019 8:00 AM Medical Record Number: [id number] Patient Account Number: [id number] Date of Birth/Sex: 1950/07/30 (68 y.o. M) Treating RN: Montey Hora Primary Care Jaquanda Wickersham: [name] Other Clinician: Referring Anzleigh Slaven: [name] Treating Neveen Daponte/Extender: [name], HOYT Weeks in Treatment: 62 Vital Signs Time Taken: 08:10 Temperature (F): 97.9 Height (in): 73 Pulse (bpm): 73 Weight (lbs): 210 Respiratory Rate (breaths/min): 16 Body Mass Index (BMI): 27.7 Blood Pressure (mmHg): 138/78 Reference Range: 80 - 120 mg / dl Electronic Signature(s) Signed: 02/08/2019 11:14:01 AM By:  Lorine Bears RCP, RRT, CHT Entered By: Lorine Bears on 02/08/2019 08:11:33

## 2019-02-14 ENCOUNTER — Encounter: Payer: Medicare Other | Admitting: Physician Assistant

## 2019-02-15 ENCOUNTER — Encounter: Payer: Medicare Other | Admitting: Physician Assistant

## 2019-02-15 ENCOUNTER — Other Ambulatory Visit: Payer: Self-pay

## 2019-02-15 DIAGNOSIS — L89314 Pressure ulcer of right buttock, stage 4: Secondary | ICD-10-CM | POA: Diagnosis not present

## 2019-02-15 NOTE — Progress Notes (Addendum)
LABIB, CWYNAR ([id number]) Visit Report for 02/15/2019 Chief Complaint Document Details Patient Name: [name], [name] Date of Service: 02/15/2019 8:00 AM Medical Record Number: [id number] Patient Account Number: [id number] Date of Birth/Sex: 1950-09-24 (68 y.o. M) Treating RN: Montey Hora Primary Care Provider: [name] Other Clinician: Referring Provider: [name] Treating Provider/Extender: [name], HOYT Weeks in Treatment: 77 Information Obtained from: Patient Chief Complaint Right gluteal fold ulcer Electronic Signature(s) Signed: 02/15/2019 8:26:46 AM By: Worthy Keeler [name] Entered By: Worthy Keeler on 02/15/2019 08:26:46 LAM, MCCUBBINS ([id number]) -------------------------------------------------------------------------------- Debridement Details Patient Name: [name] Date of Service: 02/15/2019 8:00 AM Medical Record Number: [id number] Patient Account Number: [id number] Date of Birth/Sex: [birth year] (68 y.o. M) Treating RN: Montey Hora Primary Care Provider: [name] Other Clinician: Referring Provider: [name] Treating Provider/Extender: [name], HOYT Weeks in Treatment: 35 Debridement Performed for Wound #1 Right Gluteal fold Assessment: Performed By: Physician [name], HOYT E., [name] Debridement Type: Debridement Level of Consciousness (Pre- Awake and Alert procedure): Pre-procedure Verification/Time Yes - 08:37 Out Taken: Start Time: 08:37 Pain Control: Lidocaine 4% Topical Solution Total Area Debrided (L x W): 3.5 (cm) x 2.2 (cm) = 7.7 (cm) Tissue and other material Viable, Non-Viable, Slough, Subcutaneous, Slough debrided: Level: Skin/Subcutaneous Tissue Debridement Description: Excisional Instrument: Curette Bleeding: Minimum Hemostasis Achieved: Pressure End Time: 08:43 Procedural Pain: 0 Post Procedural Pain: 0 Response to Treatment: Procedure was tolerated well Level of  Consciousness Awake and Alert (Post-procedure): Post Debridement Measurements of Total Wound Length: (cm) 3.5 Stage: Category/Stage IV Width: (cm) 2.2 Depth: (cm) 3.2 Volume: (cm) 19.352 Character of Wound/Ulcer Post Improved Debridement: Post Procedure Diagnosis Same as Pre-procedure Electronic Signature(s) Signed: 02/15/2019 4:49:07 PM By: Montey Hora Signed: 02/16/2019 9:47:59 AM By: Worthy Keeler [name] Entered By: Montey Hora on 02/15/2019 08:44:04 [name], [name] ([id number]) -------------------------------------------------------------------------------- HPI Details Patient Name: [name] Date of Service: 02/15/2019 8:00 AM Medical Record Number: [id number] Patient Account Number: [id number] Date of Birth/Sex: [birth year] (68 y.o. 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 He states that there is no history of diabetes that he is aware of. He does have issues with his prostate and is currently receiving treatment for this by way of oral medication. With that being said I do not have a lot of details in that regard. Nonetheless the patient presents today as a result of having been referred to [place] by another provider initially home health was set to come out and take care of his wound although due to the fact that he apparently drives he's not able to receive home health. His wife is therefore trying to help take care of this wound within although they have been struggling with what exactly to do at this point.  She states that she can do some things but she is definitely not a nurse and does have some issues with looking at blood. The good news is the wound does not appear to be too deep and is fairly superficial at this point. There is no slough noted there is some nonviable skin noted around the surface  of the wound and the perimeter at this point. The central portion of the wound appears to be very good with a dermal layer noted this does not appear to be again deep enough to extend it to subcutaneous tissue at this point. 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 In the pump he has Dilaudid and baclofen. 12/07/17 on evaluation today patient presents for follow-up concerning his ongoing lower back thigh ulcer on the right. He states that he did not get the supplies ordered and therefore has not really been able to perform the dressing changes as directed exactly. His wife was able to get some Boarder Foam Dressing's from the drugstore and subsequently has been using hydrogel which did help to a degree in the wound does appear to be able smaller. There is actually more drainage this week noted than previous. 12/21/17 on evaluation today patient appears to be doing rather well in regard to his right gluteal ulcer. He has been tolerating the dressing changes without complication. There does not appear to be any evidence of infection at this point in time. Overall the wound does seem to be making some progress as far as the edges are concerned there's not as much in the way of overlapping of the external wound edges and he has a good epithelium to wound bed border for the most part. This however is not true right at the 12 o'clock location over the span of a little over a centimeters which actually will require debridement today to clean this away and hopefully allow it to continue to heal more  appropriately. 12/28/17 on evaluation today patient appears to be doing rather well in regard to his ulcer in the left gluteal region. He's been tolerating the dressing changes without complication. Apparently he has had some difficulty getting his dressing material. Apparently there's been some confusion with ordering we're gonna check into this. Nonetheless overall he's been showing signs of improvement which is good news. Debridement is not required today. 01/04/18 on evaluation today patient presents for follow-up concerning his right gluteal ulcer. He has been tolerating the dressing changes fairly well. On inspection today it appears he may actually have some maceration them concerned about the fact that he may be developing too much moisture in and around the wound bed which can cause delay in healing. With that being said he unfortunately really has not showed significant signs of improvement since last week's evaluation in fact this may even be just the little bit/slightly larger. Nonetheless he's been having a lot of discomfort I'm not sure this is even related to the wound as he has no pain when I'm to breeding or otherwise cleaning the wound during evaluation today. Nonetheless this is something that we did recommend he talked to his pain specialist concerning. 01/11/18 on evaluation today patient appears to be doing better in regard to his ulceration. He has been tolerating the dressing changes without complication. With that being said overall there's no evidence of infection which is good news. The only thing is he did receive the hatch affair blue classic versus the ready nonetheless I feel like this is perfectly fine and appears to have done well for him over the past week. 01/25/18 on evaluation today patient's wound actually appears to be a little bit larger than during the last evaluation. The good [name], [name]. ([id number]) news is the majority of  the wound edges actually  appear to be fairly firmly attached to the wound bed unfortunately again we're not really making progress in regard to the size. Roughly the wound is about the same size as when I first saw him although again the wound margin/edges appear to be much better. 02/01/18 on evaluation today patient actually appears to be doing very well in regard to his wound. Applying the Prisma dry does seem to be better although he does still have issues with slow progression of the wound. There was a slight improvement compared to last week's measurements today. Nonetheless I have been considering other options as far as the possibility of Theraskin or even a snap vac. In general I'm not sure that the Theraskin due to location of the wound would be a very good idea. Nonetheless I do think that a snap vac could be a possibility for the patient and in fact I think this could even be an excellent way to manage the wound possibly seeing some improvement in a very rapid fashion here. Nonetheless this is something that we would need to get approved and I did have a lengthy conversation with the patient about this today. 02/08/18 on evaluation today patient appears to be doing a little better in regard to his ulcer. He has been tolerating the dressing changes without complication. Fortunately despite the fact that the wound is a little bit smaller it's not significantly so unfortunately. We have discussed the possibility of a snap vac we did check with insurance this is actually covered at this point. Fortunately there does not appear to be any sign of infection. Overall I'm fairly pleased with how things seem to be appearing at this point. 02/15/18 on evaluation today patient appears to be doing rather well in regard to his right gluteal ulcer. Unfortunately the snap vac did not stay in place with his sheer and friction this came loose and did not seem to maintain seal very well. He worked for about two days and it did seem  to do very well during that time according to his wife but in general this does not seem to be something that's gonna be beneficial for him long-term. I do believe we need to go back to standard dressings to see if we can find something that will be of benefit. 03/02/18- He is here in follow up evaluation; there is minimal change in the wound. He will continue with the same treatment plan, would consider changing to iodosrob/iodoflex if ulcer continues to to plateau. He will follow up next week 03/08/18 on evaluation today patient's wound actually appears to be about the same size as when I previously saw him several weeks back. Unfortunately he does have some slightly dark discoloration in the central portion of the wound which has me concerned about pressure injury. I do believe he may be sitting for too long a period of time in fact he tells me that "I probably sit for much too long". He does have some Slough noted on the surface of the wound and again as far as the size of the wound is concerned I'm really not seeing anything that seems to have improved significantly. 03/15/18 on evaluation today patient appears to be doing fairly well in regard to his ulcer. The wound measured pretty much about the same today compared to last week's evaluation when looking at his graph. With that being said the area of bruising/deep tissue injury that was noted last week I do not see  at this point. He did get a new cushion fortunately this does seem to be have been of benefit in my pinion. It does appear that he's been off of this more which is good news as well I think that is definitely showing in the overall wound measurements. With that being said I do believe that he needs to continue to offload I don't think that the fact this is doing better should be or is going to allow him to not have to offload and explain this to him as well. Overall he seems to be in agreement the plan I think he understands. The  overall appearance of the wound bed is improved compared to last week I think the Iodoflex has been beneficial in that regard. 03/29/18 on evaluation today patient actually appears to be doing rather well in regard to his wound from the overall appearance standpoint he does have some granulation although there's some Slough on the surface of the wound noted as well. With that being said he unfortunately has not improved in regard to the overall measurement of the wound in volume or in size. I did have a discussion with him very specifically about offloading today. He actually does work although he mainly is just sitting throughout the day. He tells me he offloads by "lifting himself up for 30 seconds off of his chair occasionally" purchase from [employer] which does seem to have helped. And he has a new cushion that he with that being said he's also able to stand some for a very short period of time but not significant enough I think to provide appropriate offloading. I think the biggest issue at this point with the wound and the fact is not healing as quickly as we would like is due to the fact that he is really not able to appropriately offload while at work. He states the beginning after his injury he actually had a bed at his job that he could lay on in order to offload and that does seem to have been of help back at that time. Nonetheless he had not done this in quite some time unfortunately. I think that could be helpful for him this is something I would like for him to look into. 04/05/18 on evaluation today patient actually presents for follow-up concerning his right gluteal ulcer. Again he really is not significantly improved even compared to last week. He has been tolerating the dressing changes without complication. With that being said fortunately there appears to be no evidence of infection at this time. He has been more proactive in trying to offload. [name], [name]  ([id number]) 04/12/18 on evaluation today patient actually appears to be doing a little better in regard to his wound and the right gluteal fold region. He's been tolerating the dressing changes since removing the oasis without complication. However he was having a lot of burning initially with the oasis in place. He's unsure of exactly why this was given so much discomfort but he assumes that it was the oasis itself causing the problem. Nonetheless this had to be removed after about three days in place although even those three days seem to have made a fairly good improvement in regard to the overall appearance of the wound bed. In fact is the first time that he's made any improvement from the standpoint of measurements in about six weeks. He continues to have no discomfort over the area of the wound itself which leads me to wonder why he  was having the burning with the oasis when he does not even feel the actual debridement's themselves. I am somewhat perplexed by this. 04/19/18 on evaluation today patient's wound actually appears to be showing signs of epithelialization around the edge of the wound and in general actually appears to be doing better which is good news. He did have the same burning after about three days with applying the Endoform last week in the same fashion that I would generally apply a skin substitute. This seems to indicate that it's not the oasis to cause the problem but potentially the moisture buildup that just causes things to burn or there may be some other reaction with the skin prep or Steri-Strips. Nonetheless I'm not sure that is gonna be able to tolerate any skin substitute for a long period of time. The good news is the wound actually appears to be doing better today compared to last week and does seem to finally be making some progress. 04/26/18 on evaluation today patient actually appears to be doing rather well in regard to his ulcer in the right gluteal fold.  He has been tolerating the dressing changes without complication which is good news. The Endoform does seem to be helping although he was a little bit more macerated this week. This seems to be an ongoing issue with fluid control at this point. Nonetheless I think we may be able to add something like Drawtex to help control the drainage. 05/03/18 on evaluation today patient appears to actually be doing better in regard to the overall appearance of his wound. He has been tolerating the dressing changes without complication. Fortunately there appears to be no evidence of infection at this time. I really feel like his wound has shown signs as of today of turning around last week I thought so as well and definitely he could be seen in this week's overall appearance and measurements. In general I'm very pleased with the fact that he finally seems to be making a steady but sure progress. The patient likewise is very pleased. 05/17/18 on evaluation today patient appears to be doing more poorly unfortunately in regard to his ulcer. He has been tolerating the dressing changes without complication. With that being said he tells me that in the past couple of days he and his wife have noticed that we did not seem to be doing quite as well is getting dark near the center. Subsequently upon evaluation today the wound actually does appear to be doing worse compared to previous. He has been tolerating the dressing changes otherwise and he states that he is not been sitting up anymore than he was in the past from what he tells me. Still he has continued to work he states "I'm tired of dealing with this and if I have to just go home and lay in the bed all the time that's what I'll do". Nonetheless I am concerned about the fact that this wound does appear to be deeper than what it was previous. 05/24/18 upon evaluation today patient actually presents after having been in the hospital due to what was presumed to  be sepsis secondary to the wound infection. He had an elevated white blood cell count between 14 and 15. With that being said he does seem to be doing somewhat better now. His wound still is giving him some trouble nonetheless and he is obviously concerned about the fact likely talked about that this does seem to go more deeply than previously noted. I did  review his wound culture which showed evidence of Staphylococcus aureus him and group B strep. Nonetheless he is on antibiotics, Levaquin, for this. Subsequently I did review his intake summary from the hospital as well. I also did look at the CT of the lumbar spine with contrast that was performed which showed no bone destruction to suggest lumbar disguises/osteomyelitis or sacral osteomyelitis. There was no paraspinal abscess. Nonetheless it appears this may have been more of just a soft tissue infection at this point which is good news. He still is nonetheless concerned about the wound which again I think is completely reasonable considering everything he's been through recently. 05/31/18 on evaluation today on evaluation today patient actually appears to be showing signs of his wound be a little bit deeper than what I would like to see. Fortunately he does not show any signs of significant infection although his temperature was 99 today he states he's been checking this at home and has not been elevated. Nonetheless with the undermining that I'm seeing at this point I am becoming more concerned about the wound I do think that offloading is a key factor here that is preventing the speedy recovery at this point. There does not appear to be any evidence of again over infection noted. He's been using Santyl currently. 06/07/18 the patient presents today for follow-up evaluation regarding the left ulcer in the gluteal region. He has been tolerating the Wound VAC fairly well. He is obviously very frustrated with this he states that to mean is really  getting in his way. There does not appear to be any evidence of infection at this time he does have a little bit of odor I do not necessarily associate this with infection just something that we sometimes notice with Wound VAC therapy. With that being said I can definitely catch a tone of discontentment overall in the patient's demeanor today. This when he was previously in the hospital [name], [name]. ([id number]) an CT scan was done of the lumbar region which did not reveal any signs of osteomyelitis. With that being said the pelvis in particular was not evaluated distinctly which means he could still have some osteonecrosis I. Nonetheless the Wound VAC was started on Thursday I do want to get this little bit more time before jumping to a CT scan of the pelvis although that is something that I might would recommend if were not see an improvement by that time. 06/14/18 on evaluation today patient actually appears to be doing about the same in regard to his right gluteal ulcer. Again he did have a CT scan of the lumbar spine unfortunately this did not include the pelvis. Nonetheless with the depth of the wound that I'm seeing today even despite the fact that I'm not seeing any evidence of overt cellulitis I believe there's a good chance that we may be dealing with osteomyelitis somewhere in the right Ischial region. No fevers, chills, nausea, or vomiting noted at this time. 06/21/18 on evaluation today patient actually appears to be doing about the same with regard to his wound. The tunnel at 6 o'clock really does not appear to be any deeper although it is a little bit wider. I think at this point you may want to start packing this with white phone. Unfortunately I have not got approval for the CT scan of the pelvis as of yet due to the fact that Medicare apparently has been denied it due to the diagnosis codes not being appropriate according to  Medicare for the test requested. With that being  said the patient cannot have an MRI and therefore this is the only option that we have as far as testing is concerned. The patient has had infection and was on antibiotics and been added code for cellulitis of the bottom to see if this will be appropriate for getting the test approved. Nonetheless I'm concerned about the infection have been spread deeper into the Ischial region. 06/28/18 on evaluation today patient actually appears to be doing rather well all things considered in regard to the right gluteal ulcer. He has been tolerating the dressing changes without complication. With that being said the Wound VAC he states does have to be replaced almost every day or at least reinforced unfortunately. Patient actually has his CT scan later this morning we should have the results by tomorrow. 07/05/18 on evaluation today patient presents for follow-up concerning his right Ischial ulcer. He did see the surgeon Dr. Lysle Pearl last week. They were actually very happy with him and felt like he spent a tremendous amount of time with them as far as discussing his situation was concerned. In the end Dr. Lysle Pearl did contact me as well and determine that he would not recommend any surgical intervention at this point as he felt like it would not be in the patient's best interest based on what he was seeing. He recommended a referral to infectious disease. Subsequently this is something that Dr. Ines Bloomer office is working on setting up for the patient. As far as evaluation today is concerned the patient's wound actually appears to be worse at this point. I am concerned about how things are progressing and specifically about infection. I do not feel like it's the deeper but the area of depth is definitely widening which does have me concerned. No fevers, chills, nausea, or vomiting noted at this time. I think that we do need initiate antibiotic therapy the patient has an allow allergy to amoxicillin/penicillin he states  that he gets a rash since childhood. Nonetheless she's never had the issues with Catholics or cephalosporins in general but he is aware of. 07/27/18 on evaluation today patient presents following admission to the hospital on 07/09/18. He was subsequently discharged on 07/20/18. On 07/15/18 the patient underwent irrigation and debridement was soft tissue biopsy and bone biopsy as well as placement of a Wound VAC in the OR by Dr. Celine Ahr. During the hospital course the patient was placed on a Wound VAC and recommended follow up with surgery in three weeks actually with Dr. Delaine Lame who is infectious disease. The patient was on vancomycin during the hospital course. 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 He also had a bone culture which revealed evidence of methicillin-resistant staph aureus. He is updated CT scan 07/09/18 reveals that he had progression of the which was performed on wound to breakdown down to the trochanter where he actually had irregularities there as well suggestive of osteomyelitis. This was a change just since 9 December when we last performed a CT scan. Obviously this one had gone downhill quite significantly and rapidly. At this point upon evaluation I feel like in general the patient's wound seems to be doing fairly well all things considered upon my evaluation today. Obviously this is larger and deeper than what I previously evaluated but at the same time he seems to be making some progress as far as the appearance of the granulation tissue is concerned. I'm happy in that regard.  No fevers, chills, nausea, or vomiting noted at this time. He is on IV vancomycin and Rocephin at the facility. He is currently in [facility]. 08/03/18 upon evaluation today patient's wound appears to be doing better in regard to the overall appearance at this point in time. Fortunately he's been tolerating the Wound VAC without complication and states that  the facility has been taking excellent care of the wound site. Overall I see some Slough noted on the surface which I am going to attempt sharp debridement today of but nonetheless other than this I feel like he's making progress. 08/09/18 on evaluation today patient's wound appears to be doing much better compared to even last week's evaluation. Do believe that the Wound VAC is been of great benefit for him. He has been tolerating the dressing changes that is the Wound [name], [name]. ([id number]) VAC without any complication and he has excellent granulation noted currently. There is no need for sharp debridement at this point. 08/16/18 on evaluation today patient actually appears to be doing very well in regard to the wound in the right gluteal fold region. This is showing signs of progress and again appears to be very healthy which is excellent news. Fortunately there is no sign of active infection by way of odor or drainage at this point. Overall I'm very pleased with how things stand. He seems to be tolerating the Wound VAC without complication. 08/23/18 on evaluation today patient actually appears to be doing better in regard to his wound. He has been tolerating the Wound VAC without complication and in fact it has been collecting a significant amount of drainage which I think is good news especially considering how the wound appears. Fortunately there is no signs of infection at this time definitely nothing appears to be worse which is good news. He has not been started on the Bactrim and Flagyl that was recommended by Dr. Delaine Lame yet. I did actually contact her office this morning in order to check and see were things are that regard their gonna be calling me back. 08/30/18 on evaluation today patient actually appears to show signs of excellent improvement today compared to last evaluation. The undermining is getting much better the wound seems to be feeling quite nicely and I'm very  pleased that the granulation in general. With that being said overall I feel like the patient has made excellent progress which is great news. No fevers, chills, nausea, or vomiting noted at this time. 09/06/18 on evaluation today patient actually appears to be doing rather well in regard to his right gluteal ulcer. This is showing signs of improvement in overall I'm very pleased with how things seem to be progressing. The patient likewise is please. Overall I see no evidence of infection he is about to complete his oral antibiotic regimen which is the end of the antibiotics for him in just about three days. 09/13/18 on evaluation today patient's right Ischial ulcer appears to be showing signs of continued improvement which is excellent news. He's been tolerating the dressing changes without complication. Fortunately there's no signs of infection and the wound that seems to be doing very well. 09/28/18 on evaluation today patient appears to be doing rather well in regard to his right Ischial ulcer. He's been tolerating the Wound VAC without complication he knows there's much less drainage than there used to be this obviously is not a bad thing in my pinion. There's no evidence of infection despite the fact is but nothing about  it now for several weeks. 10/04/18 on evaluation today patient appears to be doing better in regard to his right Ischial wound. He has been tolerating the Wound VAC without complication and I do believe that the silver nitrate last week was beneficial for him. Fortunately overall there's no evidence of active infection at this time which is great news. No fevers, chills, nausea, or vomiting noted at this time. 10/11/18 on evaluation today patient actually appears to be doing rather well in regard to his Ischial ulcer. He's been tolerating the Wound VAC still without complication I feel like this is doing a good job. No fevers, chills, nausea, or vomiting noted at  this time. 11/01/18 on evaluation today patient presents after having not been seen in our clinic for several weeks secondary to the fact that he was on evaluation today patient presents after having not been seen in our clinic for several weeks secondary to the fact that he was in a skilled nursing facility which was on lockdown currently due to the covert 19 national emergency. Subsequently he was discharged from the facility on this past Friday and subsequently made an appointment to come in to see yesterday. Fortunately there's no signs of active infection at this time which is good news and overall he does seem to have made progress since I last saw. Overall I feel like things are progressing quite nicely. The patient is having no pain. 11/08/18 on evaluation today patient appears to be doing okay in regard to his right gluteal ulcer. He has been utilizing a Wound VAC home health this changing this at this point since he's home from the skilled nursing facility. Fortunately there's no signs of obvious active infection at this time. Unfortunately though there's no obvious active infection he is having some maceration and his wife states that when the sheets of the Wound VAC office on Sunday when it broke seal that he ended up having significant issues with some smell as well there concerned about the possibility of infection. Fortunately there's No fevers, chills, nausea, or vomiting noted at this time. 11/15/18 on evaluation today patient actually appears to be doing well in regard to his right gluteal ulcer. He has been tolerating the dressing changes without complication. Specifically the Wound VAC has been utilized up to this point. Fortunately there's no signs of infection and overall I feel like he has made progress even since last week when I last saw him. I'm actually fairly happy with the overall appearance although he does seem to have somewhat of a hyper granular Spatz, Bufford J.  ([id number]) overgrowth in the central portion of the wound which I think may require some sharp debridement to try flatness out possibly utilizing chemical cauterization following. 11/23/18 on evaluation today patient actually appears to be doing very well in regard to his sacral ulcer. He seems to be showing signs of improvement with good granulation. With that being said he still has the small area of hyper granulation right in the central portion of the wound which I'm gonna likely utilize silver nitrate on today. Subsequently he also keeps having a leak at the 6 o'clock location which is unfortunate we may be able to help out with some suggestions to try to prevent this going forward. Fortunately there's no signs of active infection at this time. 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 He's been tolerating the dressing changes  without complication. Fortunately there's no signs of active infection at this time. I've been rather pleased with how things have progressed there still some evidence of pressure getting to the area with some redness right around the immediate wound opening. Nonetheless other than this I'm not seeing any significant complications or issues the wound is somewhat hyper granular. Upon discussing with the patient and his wife today I'm not sure that the wound is being packed to the base with the foam at this point. And if it's not been packed fully that may be part of the reason why is not seen as much improvement as far as the granulation from the base out. Again we do not want pack too tightly but we need some of the firm to get to the base of the wound. I discussed this with patient and his wife today. 12/06/18 on evaluation today patient appears to be doing well in regard to his right gluteal pressure ulcer. He's been tolerating the dressing changes without complication.  Fortunately there's no signs of active infection. He still has some hyper granular tissue and I do think it would be appropriate to continue with the chemical cauterization as of today. 12/16/18 on evaluation today patient actually appears to be doing okay in regard to his right gluteal ulcer. He is been tolerating the dressing changes without complication including the Wound VAC. Overall I feel like nothing seems to be worsening I do feel like that the hyper granulation buds in the central portion of the wound have improved to some degree with the silver nitrate. We will have to see how things continue to progress. 12/20/18 on evaluation today patient actually appears to be doing much worse in my pinion even compared to last week's evaluation. Unfortunately as opposed to showing any signs of improvement the areas of hyper granular tissue in the central portion of the wound seem to be getting worse. Subsequently the wound bed itself also seems to be getting deeper even compared to last week which is both unusual as well as concerning since prior he had been shown signs of improvement. Nonetheless I think that the issue could be that he's actually having some difficulty in issues with a deeper infection. There's no external signs of infection but nonetheless I am more worried about the internal, osteomyelitis, that could be restarting. He has not been on antibiotics for some time at this point. I think that it may be a good idea to go ahead and started back on an antibiotic therapy while we wait to see what the testing shows. 12/27/18 on evaluation today patient presents for follow-up concerning his left gluteal fold wound. Fortunately he appears to be doing well today. I did review the CT scan which was negative for any signs of osteomyelitis or acute abnormality this is excellent news. Overall I feel like the surface of the wound bed appears to be doing significantly better today compared to previously  noted findings. There does not appear any signs of infection nor does he have any pain at this time. 01/03/19 on evaluation today patient actually appears to be doing quite well in regard to his ulcer. Post debridement last week he really did not have too much bleeding which is good news. Fortunately today this seems to be doing some better but we still has some of the hyper granular tissue noted in the base of the wound which is gonna require sharp debridement today as well. Overall I'm pleased with how things seem to  be progressing since we switched away from the Wound VAC I think he is making some progress. 01/10/19 on evaluation today patient appears to be doing better in regard to his right gluteal fold ulcer. He has been tolerating the dressing changes without complication. The debridement to seem to be helping with current away some of the poor hyper granular tissue bugs throughout the region of his gluteal fold wound. He's been tolerating the dressing changes otherwise without complication which is great news. No fevers, chills, nausea, or vomiting noted at this time. 01/17/19 on evaluation today patient actually appears to be doing excellent in regard to his wound. He's been tolerating the dressing changes without complication. Fortunately there is no signs of active infection at this time which is great news. No fevers, chills, nausea, or vomiting noted at this time. 01/24/19 on evaluation today patient actually appears to be doing quite well with regard to his ulcer. He has been tolerating the dressing changes without complication. Fortunately there's no signs of active infection at this time. Overall been very pleased with the progress that he seems to be making currently. 01/31/19-Patient returns at 1 week with apparent similarity in dimensions to the wound, with no signs of infection, he has been HUBER, MATHERS. ([id number]) changing dressings twice a day 02/08/19 upon evaluation today  patient actually appears to be doing well with regard to his right Ischial ulcer. The wound is not appear to be quite as deep and seems to be making progress which is good news. With that being said I'm still reluctant to go back to the Wound VAC at this point. He's been having to change the dressings twice a day which is a little bit much in my pinion from the wound care supplies standpoint. I think that possibly attempting to utilize extras orbit may be beneficial this may also help to prevent any additional breakdown secondary to fluid retention in the wound itself. The patient is in agreement with giving this a try. 02/15/19 on evaluation today patient actually appears to be doing decently well with regard to his ulcer in the right to gluteal fold location. He's been tolerating the dressing changes without complication. Fortunately there is no signs of active infection at this time. He is able to keep the current dressing in place more effectively for a day at a time whereas before he was having a changes to to three times a day. The actions or has been helpful in this regard. Fortunately there's no signs of anything getting worse and I do feel like he showing signs of good improvement with regard to the wound bed status. Electronic Signature(s) Signed: 02/16/2019 9:47:59 AM By: Worthy Keeler [name] Entered By: Worthy Keeler on 02/15/2019 10:01:17 TAAVI, [name] ([id number]) -------------------------------------------------------------------------------- Physical Exam Details Patient Name: [name] Date of Service: 02/15/2019 8:00 AM Medical Record Number: [id number] Patient Account Number: [id number] Date of Birth/Sex: 1951/05/12 (68 y.o. M) Treating RN: Montey Hora Primary Care Provider: [name] Other Clinician: Referring Provider: [name] Treating Provider/Extender: [name], HOYT Weeks in Treatment: 37 Constitutional Well-nourished and  well-hydrated in no acute distress. Respiratory normal breathing without difficulty. Psychiatric this patient is able to make decisions and demonstrates good insight into disease process. Alert and Oriented x 3. pleasant and cooperative. Notes Patient's wound bed did require sharp agreement to remove some Slough and hyper granular tissue from the base of the wound he tolerated debridement today without complication post debridement wound bed appears to be  doing much better. Electronic Signature(s) Signed: 02/16/2019 9:47:59 AM By: Worthy Keeler [name] Entered By: Worthy Keeler on 02/15/2019 10:02:57 [name], [name] ([id number]) -------------------------------------------------------------------------------- Physician Orders Details Patient Name: [name] Date of Service: 02/15/2019 8:00 AM Medical Record Number: [id number] Patient Account Number: [id number] Date of Birth/Sex: 06/24/1951 (68 y.o. 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 Skin Barriers/Peri-Wound Care Wound #1 Right Gluteal fold o Other: - Apply zinc oxide to peri-wound area. Primary Wound Dressing Wound #1 Right Gluteal fold o Silver Alginate - pack lightly into wound Secondary Dressing Wound #1 Right  Gluteal fold o XtraSorb - Equivalent product may be used but please order a size that is appropriate for the wound size and location. Secure with Tegaderm Dressing Change Frequency Wound #1 Right Gluteal fold o Change dressing every day. Follow-up Appointments Wound #1 Right Gluteal fold o Return Appointment in 1 week. Off-Loading Wound #1 Right Gluteal fold o Turn and reposition every 2 hours LINCON, SAHLIN ([id number]) Home Health Wound #1 Right Gluteal fold o Continue Home Health Visits - PLEASE, have supplies for patient! o Home Health Nurse may visit PRN to address patientos wound care needs. o FACE TO FACE ENCOUNTER: MEDICARE and MEDICAID PATIENTS: I certify that this patient is under my care and that I had a face-to-face encounter that meets the physician face-to-face encounter requirements with this patient on this date. The encounter with the patient was in whole or in part for the following MEDICAL CONDITION: (primary reason for [place]) MEDICAL NECESSITY: I certify, that based on my findings, NURSING services are a medically necessary home health service. HOME BOUND STATUS: I certify that my clinical findings support that this patient is homebound (i.e., Due to illness or injury, pt requires aid of supportive devices such as crutches, cane, wheelchairs, walkers, the use of special transportation or the assistance of another person to leave their place of residence. There is a normal inability to leave the home and doing so requires considerable and taxing effort. Other absences are for medical reasons / religious services and are infrequent or of short duration when for other reasons). o If current dressing causes regression in wound condition, may D/C ordered dressing product/s and apply Normal Saline Moist Dressing daily until next [facility] / Other MD appointment. [place] of regression in wound condition at  [phone]. o Please direct any NON-WOUND related issues/requests for orders to patient's Primary Care Physician Electronic Signature(s) Signed: 02/15/2019 4:49:07 PM By: Montey Hora Signed: 02/16/2019 9:47:59 AM By: Worthy Keeler [name] Entered By: Montey Hora on 02/15/2019 08:39:11 [name], [name] ([id number]) -------------------------------------------------------------------------------- Problem List Details Patient Name: [name] Date of Service: 02/15/2019 8:00 AM Medical Record Number: [id number] Patient Account Number: [id number] Date of Birth/Sex: Apr 25, 1951 (68 y.o. M) Treating RN: Montey Hora Primary Care Provider: [name] Other Clinician: Referring Provider: [name] Treating Provider/Extender: [name], HOYT  Weeks in Treatment: 63 Active Problems ICD-10 Evaluated Encounter Code Description Active Date Today Diagnosis L89.314 Pressure ulcer of right buttock, stage 4 11/30/2017 No Yes L03.317 Cellulitis of buttock 06/21/2018 No Yes G82.20 Paraplegia, unspecified 11/30/2017 No Yes S34.109S Unspecified injury to unspecified level of lumbar spinal cord, 11/30/2017 No Yes sequela I10 Essential (primary) hypertension 11/30/2017 No Yes Inactive Problems Resolved Problems Electronic Signature(s) Signed: 02/15/2019 8:26:35 AM By: Worthy Keeler [name] Entered By: Worthy Keeler on 02/15/2019 08:26:35 [name], [name] ([id number]) -------------------------------------------------------------------------------- Progress Note Details Patient Name: [name] Date of Service: 02/15/2019 8:00 AM Medical Record Number: [id number] Patient Account Number: [id number] Date of Birth/Sex: Oct 11, 1950 (68 y.o. 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 He states that there is no history of diabetes that he is aware of. He does have issues with his prostate and is currently receiving treatment for this by way of oral medication. With that being said I do not have a lot of details in that regard. Nonetheless the patient presents today as a result of having been referred to [place] by another provider initially home health was set to come out and take care of his wound although due to the fact that he apparently drives he's not able to receive home health. His wife is therefore trying to help take care of this wound within although they have been struggling with what exactly to do at this point. She states that she can do some things but she is definitely not a nurse and does have some issues with looking at blood. The good news is the wound does not appear to be too deep and is fairly superficial at this point. There is no slough noted there is some nonviable skin noted around the surface of the wound and the perimeter at this point. The central portion of the wound appears to be very good with a dermal layer noted this does not appear to be again deep enough to extend it to subcutaneous tissue at this point. 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 In the pump he has Dilaudid and baclofen. 12/07/17 on evaluation today patient presents for follow-up concerning his ongoing lower back thigh ulcer on the right. He states that he did not get the supplies ordered and therefore has not really been  able to perform the dressing changes as directed exactly. His wife was able to get some Boarder Foam Dressing's from the drugstore and subsequently has been using hydrogel which did help to a degree in the wound does appear to be able smaller. There is actually more drainage this week noted than previous. 12/21/17 on evaluation today patient appears to be doing rather well in regard to his right gluteal ulcer. He has been tolerating the dressing changes without complication. There does not appear to be any evidence of infection at this point in time. Overall the wound does seem to be making some progress as  far as the edges are concerned there's not as much in the way of overlapping of the external wound edges and he has a good epithelium to wound bed border for the most part. This however is not true right at the 12 o'clock location over the span of a little over a centimeters which actually will require debridement today to clean this away and hopefully allow it to continue to heal more appropriately. 12/28/17 on evaluation today patient appears to be doing rather well in regard to his ulcer in the left gluteal region. He's been tolerating the dressing changes without complication. Apparently he has had some difficulty getting his dressing material. Apparently there's been some confusion with ordering we're gonna check into this. Nonetheless overall he's been showing signs of improvement which is good news. Debridement is not required today. 01/04/18 on evaluation today patient presents for follow-up concerning his right gluteal ulcer. He has been tolerating the dressing changes fairly well. On inspection today it appears he may actually have some maceration them concerned about the fact that he may be developing too much moisture in and around the wound bed which can cause delay in healing. With that being said he unfortunately really has not showed significant signs of improvement since last week's  evaluation in fact this may even be just the little bit/slightly larger. Nonetheless he's been having a lot of discomfort I'm not sure this is even related to the wound as he has no pain when I'm to breeding or otherwise cleaning the wound during evaluation today. Nonetheless this is something that we did recommend he talked to his pain specialist concerning. 01/11/18 on evaluation today patient appears to be doing better in regard to his ulceration. He has been tolerating the dressing Schimming, Jaxxon J. ([id number]) changes without complication. With that being said overall there's no evidence of infection which is good news. The only thing is he did receive the hatch affair blue classic versus the ready nonetheless I feel like this is perfectly fine and appears to have done well for him over the past week. 01/25/18 on evaluation today patient's wound actually appears to be a little bit larger than during the last evaluation. The good news is the majority of the wound edges actually appear to be fairly firmly attached to the wound bed unfortunately again we're not really making progress in regard to the size. Roughly the wound is about the same size as when I first saw him although again the wound margin/edges appear to be much better. 02/01/18 on evaluation today patient actually appears to be doing very well in regard to his wound. Applying the Prisma dry does seem to be better although he does still have issues with slow progression of the wound. There was a slight improvement compared to last week's measurements today. Nonetheless I have been considering other options as far as the possibility of Theraskin or even a snap vac. In general I'm not sure that the Theraskin due to location of the wound would be a very good idea. Nonetheless I do think that a snap vac could be a possibility for the patient and in fact I think this could even be an excellent way to manage the wound possibly seeing some  improvement in a very rapid fashion here. Nonetheless this is something that we would need to get approved and I did have a lengthy conversation with the patient about this today. 02/08/18 on evaluation today patient appears to be doing a little better in  regard to his ulcer. He has been tolerating the dressing changes without complication. Fortunately despite the fact that the wound is a little bit smaller it's not significantly so unfortunately. We have discussed the possibility of a snap vac we did check with insurance this is actually covered at this point. Fortunately there does not appear to be any sign of infection. Overall I'm fairly pleased with how things seem to be appearing at this point. 02/15/18 on evaluation today patient appears to be doing rather well in regard to his right gluteal ulcer. Unfortunately the snap vac did not stay in place with his sheer and friction this came loose and did not seem to maintain seal very well. He worked for about two days and it did seem to do very well during that time according to his wife but in general this does not seem to be something that's gonna be beneficial for him long-term. I do believe we need to go back to standard dressings to see if we can find something that will be of benefit. 03/02/18- He is here in follow up evaluation; there is minimal change in the wound. He will continue with the same treatment plan, would consider changing to iodosrob/iodoflex if ulcer continues to to plateau. He will follow up next week 03/08/18 on evaluation today patient's wound actually appears to be about the same size as when I previously saw him several weeks back. Unfortunately he does have some slightly dark discoloration in the central portion of the wound which has me concerned about pressure injury. I do believe he may be sitting for too long a period of time in fact he tells me that "I probably sit for much too long". He does have some Slough noted on  the surface of the wound and again as far as the size of the wound is concerned I'm really not seeing anything that seems to have improved significantly. 03/15/18 on evaluation today patient appears to be doing fairly well in regard to his ulcer. The wound measured pretty much about the same today compared to last week's evaluation when looking at his graph. With that being said the area of bruising/deep tissue injury that was noted last week I do not see at this point. He did get a new cushion fortunately this does seem to be have been of benefit in my pinion. It does appear that he's been off of this more which is good news as well I think that is definitely showing in the overall wound measurements. With that being said I do believe that he needs to continue to offload I don't think that the fact this is doing better should be or is going to allow him to not have to offload and explain this to him as well. Overall he seems to be in agreement the plan I think he understands. The overall appearance of the wound bed is improved compared to last week I think the Iodoflex has been beneficial in that regard. 03/29/18 on evaluation today patient actually appears to be doing rather well in regard to his wound from the overall appearance standpoint he does have some granulation although there's some Slough on the surface of the wound noted as well. With that being said he unfortunately has not improved in regard to the overall measurement of the wound in volume or in size. I did have a discussion with him very specifically about offloading today. He actually does work although he mainly is just sitting throughout the day.  He tells me he offloads by "lifting himself up for 30 seconds off of his chair occasionally" purchase from [employer] which does seem to have helped. And he has a new cushion that he with that being said he's also able to stand some for a very short period of time but not significant  enough I think to provide appropriate offloading. I think the biggest issue at this point with the wound and the fact is not healing as quickly as we would like is due to the fact that he is really not able to appropriately offload while at work. He states the beginning after his injury he actually had a bed at his job that he could lay on in order to offload and that does seem to have been of help back at that time. Nonetheless he had not done this in quite some time unfortunately. I think that could be helpful for him this is something I would like for him to look into. FILLMORE, BYNUM ([id number]) 04/05/18 on evaluation today patient actually presents for follow-up concerning his right gluteal ulcer. Again he really is not significantly improved even compared to last week. He has been tolerating the dressing changes without complication. With that being said fortunately there appears to be no evidence of infection at this time. He has been more proactive in trying to offload. 04/12/18 on evaluation today patient actually appears to be doing a little better in regard to his wound and the right gluteal fold region. He's been tolerating the dressing changes since removing the oasis without complication. However he was having a lot of burning initially with the oasis in place. He's unsure of exactly why this was given so much discomfort but he assumes that it was the oasis itself causing the problem. Nonetheless this had to be removed after about three days in place although even those three days seem to have made a fairly good improvement in regard to the overall appearance of the wound bed. In fact is the first time that he's made any improvement from the standpoint of measurements in about six weeks. He continues to have no discomfort over the area of the wound itself which leads me to wonder why he was having the burning with the oasis when he does not even feel the actual debridement's  themselves. I am somewhat perplexed by this. 04/19/18 on evaluation today patient's wound actually appears to be showing signs of epithelialization around the edge of the wound and in general actually appears to be doing better which is good news. He did have the same burning after about three days with applying the Endoform last week in the same fashion that I would generally apply a skin substitute. This seems to indicate that it's not the oasis to cause the problem but potentially the moisture buildup that just causes things to burn or there may be some other reaction with the skin prep or Steri-Strips. Nonetheless I'm not sure that is gonna be able to tolerate any skin substitute for a long period of time. The good news is the wound actually appears to be doing better today compared to last week and does seem to finally be making some progress. 04/26/18 on evaluation today patient actually appears to be doing rather well in regard to his ulcer in the right gluteal fold. He has been tolerating the dressing changes without complication which is good news. The Endoform does seem to be helping although he was a little bit more macerated  this week. This seems to be an ongoing issue with fluid control at this point. Nonetheless I think we may be able to add something like Drawtex to help control the drainage. 05/03/18 on evaluation today patient appears to actually be doing better in regard to the overall appearance of his wound. He has been tolerating the dressing changes without complication. Fortunately there appears to be no evidence of infection at this time. I really feel like his wound has shown signs as of today of turning around last week I thought so as well and definitely he could be seen in this week's overall appearance and measurements. In general I'm very pleased with the fact that he finally seems to be making a steady but sure progress. The patient likewise is very pleased. 05/17/18 on  evaluation today patient appears to be doing more poorly unfortunately in regard to his ulcer. He has been tolerating the dressing changes without complication. With that being said he tells me that in the past couple of days he and his wife have noticed that we did not seem to be doing quite as well is getting dark near the center. Subsequently upon evaluation today the wound actually does appear to be doing worse compared to previous. He has been tolerating the dressing changes otherwise and he states that he is not been sitting up anymore than he was in the past from what he tells me. Still he has continued to work he states "I'm tired of dealing with this and if I have to just go home and lay in the bed all the time that's what I'll do". Nonetheless I am concerned about the fact that this wound does appear to be deeper than what it was previous. 05/24/18 upon evaluation today patient actually presents after having been in the hospital due to what was presumed to be sepsis secondary to the wound infection. He had an elevated white blood cell count between 14 and 15. With that being said he does seem to be doing somewhat better now. His wound still is giving him some trouble nonetheless and he is obviously concerned about the fact likely talked about that this does seem to go more deeply than previously noted. I did review his wound culture which showed evidence of Staphylococcus aureus him and group B strep. Nonetheless he is on antibiotics, Levaquin, for this. Subsequently I did review his intake summary from the hospital as well. I also did look at the CT of the lumbar spine with contrast that was performed which showed no bone destruction to suggest lumbar disguises/osteomyelitis or sacral osteomyelitis. There was no paraspinal abscess. Nonetheless it appears this may have been more of just a soft tissue infection at this point which is good news. He still is nonetheless concerned about the  wound which again I think is completely reasonable considering everything he's been through recently. 05/31/18 on evaluation today on evaluation today patient actually appears to be showing signs of his wound be a little bit deeper than what I would like to see. Fortunately he does not show any signs of significant infection although his temperature was 99 today he states he's been checking this at home and has not been elevated. Nonetheless with the undermining that I'm seeing at this point I am becoming more concerned about the wound I do think that offloading is a key factor here that is preventing the speedy recovery at this point. There does not appear to be any evidence of again over infection  noted. He's been using Santyl currently. MARQ, REBELLO ([id number]) 06/07/18 the patient presents today for follow-up evaluation regarding the left ulcer in the gluteal region. He has been tolerating the Wound VAC fairly well. He is obviously very frustrated with this he states that to mean is really getting in his way. There does not appear to be any evidence of infection at this time he does have a little bit of odor I do not necessarily associate this with infection just something that we sometimes notice with Wound VAC therapy. With that being said I can definitely catch a tone of discontentment overall in the patient's demeanor today. This when he was previously in the hospital an CT scan was done of the lumbar region which did not reveal any signs of osteomyelitis. With that being said the pelvis in particular was not evaluated distinctly which means he could still have some osteonecrosis I. Nonetheless the Wound VAC was started on Thursday I do want to get this little bit more time before jumping to a CT scan of the pelvis although that is something that I might would recommend if were not see an improvement by that time. 06/14/18 on evaluation today patient actually appears to be doing  about the same in regard to his right gluteal ulcer. Again he did have a CT scan of the lumbar spine unfortunately this did not include the pelvis. Nonetheless with the depth of the wound that I'm seeing today even despite the fact that I'm not seeing any evidence of overt cellulitis I believe there's a good chance that we may be dealing with osteomyelitis somewhere in the right Ischial region. No fevers, chills, nausea, or vomiting noted at this time. 06/21/18 on evaluation today patient actually appears to be doing about the same with regard to his wound. The tunnel at 6 o'clock really does not appear to be any deeper although it is a little bit wider. I think at this point you may want to start packing this with white phone. Unfortunately I have not got approval for the CT scan of the pelvis as of yet due to the fact that Medicare apparently has been denied it due to the diagnosis codes not being appropriate according to Medicare for the test requested. With that being said the patient cannot have an MRI and therefore this is the only option that we have as far as testing is concerned. The patient has had infection and was on antibiotics and been added code for cellulitis of the bottom to see if this will be appropriate for getting the test approved. Nonetheless I'm concerned about the infection have been spread deeper into the Ischial region. 06/28/18 on evaluation today patient actually appears to be doing rather well all things considered in regard to the right gluteal ulcer. He has been tolerating the dressing changes without complication. With that being said the Wound VAC he states does have to be replaced almost every day or at least reinforced unfortunately. Patient actually has his CT scan later this morning we should have the results by tomorrow. 07/05/18 on evaluation today patient presents for follow-up concerning his right Ischial ulcer. He did see the surgeon Dr. Lysle Pearl last week.  They were actually very happy with him and felt like he spent a tremendous amount of time with them as far as discussing his situation was concerned. In the end Dr. Lysle Pearl did contact me as well and determine that he would not recommend any surgical intervention at this point  as he felt like it would not be in the patient's best interest based on what he was seeing. He recommended a referral to infectious disease. Subsequently this is something that Dr. Ines Bloomer office is working on setting up for the patient. As far as evaluation today is concerned the patient's wound actually appears to be worse at this point. I am concerned about how things are progressing and specifically about infection. I do not feel like it's the deeper but the area of depth is definitely widening which does have me concerned. No fevers, chills, nausea, or vomiting noted at this time. I think that we do need initiate antibiotic therapy the patient has an allow allergy to amoxicillin/penicillin he states that he gets a rash since childhood. Nonetheless she's never had the issues with Catholics or cephalosporins in general but he is aware of. 07/27/18 on evaluation today patient presents following admission to the hospital on 07/09/18. He was subsequently discharged on 07/20/18. On 07/15/18 the patient underwent irrigation and debridement was soft tissue biopsy and bone biopsy as well as placement of a Wound VAC in the OR by Dr. Celine Ahr. During the hospital course the patient was placed on a Wound VAC and recommended follow up with surgery in three weeks actually with Dr. Delaine Lame who is infectious disease. The patient was on vancomycin during the hospital course. 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 He also had a bone culture which revealed evidence of methicillin-resistant staph aureus. He is updated CT scan 07/09/18 reveals that he had progression of the which was performed on wound to  breakdown down to the trochanter where he actually had irregularities there as well suggestive of osteomyelitis. This was a change just since 9 December when we last performed a CT scan. Obviously this one had gone downhill quite significantly and rapidly. At this point upon evaluation I feel like in general the patient's wound seems to be doing fairly well all things considered upon my evaluation today. Obviously this is larger and deeper than what I previously evaluated but at the same time he seems to be making some progress as far as the appearance of the granulation tissue is concerned. I'm happy in that regard. No fevers, chills, nausea, or vomiting noted at this time. He is on IV vancomycin and Rocephin at the facility. He is currently in [facility]. 08/03/18 upon evaluation today patient's wound appears to be doing better in regard to the overall appearance at this point in time. Fortunately he's been tolerating the Wound VAC without complication and states that the facility has been taking [name], [name]. ([id number]) excellent care of the wound site. Overall I see some Slough noted on the surface which I am going to attempt sharp debridement today of but nonetheless other than this I feel like he's making progress. 08/09/18 on evaluation today patient's wound appears to be doing much better compared to even last week's evaluation. Do believe that the Wound VAC is been of great benefit for him. He has been tolerating the dressing changes that is the Wound VAC without any complication and he has excellent granulation noted currently. There is no need for sharp debridement at this point. 08/16/18 on evaluation today patient actually appears to be doing very well in regard to the wound in the right gluteal fold region. This is showing signs of progress and again appears to be very healthy which is excellent news. Fortunately there is no sign of active infection by  way of odor or  drainage at this point. Overall I'm very pleased with how things stand. He seems to be tolerating the Wound VAC without complication. 08/23/18 on evaluation today patient actually appears to be doing better in regard to his wound. He has been tolerating the Wound VAC without complication and in fact it has been collecting a significant amount of drainage which I think is good news especially considering how the wound appears. Fortunately there is no signs of infection at this time definitely nothing appears to be worse which is good news. He has not been started on the Bactrim and Flagyl that was recommended by Dr. Delaine Lame yet. I did actually contact her office this morning in order to check and see were things are that regard their gonna be calling me back. 08/30/18 on evaluation today patient actually appears to show signs of excellent improvement today compared to last evaluation. The undermining is getting much better the wound seems to be feeling quite nicely and I'm very pleased that the granulation in general. With that being said overall I feel like the patient has made excellent progress which is great news. No fevers, chills, nausea, or vomiting noted at this time. 09/06/18 on evaluation today patient actually appears to be doing rather well in regard to his right gluteal ulcer. This is showing signs of improvement in overall I'm very pleased with how things seem to be progressing. The patient likewise is please. Overall I see no evidence of infection he is about to complete his oral antibiotic regimen which is the end of the antibiotics for him in just about three days. 09/13/18 on evaluation today patient's right Ischial ulcer appears to be showing signs of continued improvement which is excellent news. He's been tolerating the dressing changes without complication. Fortunately there's no signs of infection and the wound that seems to be doing very well. 09/28/18 on evaluation today  patient appears to be doing rather well in regard to his right Ischial ulcer. He's been tolerating the Wound VAC without complication he knows there's much less drainage than there used to be this obviously is not a bad thing in my pinion. There's no evidence of infection despite the fact is but nothing about it now for several weeks. 10/04/18 on evaluation today patient appears to be doing better in regard to his right Ischial wound. He has been tolerating the Wound VAC without complication and I do believe that the silver nitrate last week was beneficial for him. Fortunately overall there's no evidence of active infection at this time which is great news. No fevers, chills, nausea, or vomiting noted at this time. 10/11/18 on evaluation today patient actually appears to be doing rather well in regard to his Ischial ulcer. He's been tolerating the Wound VAC still without complication I feel like this is doing a good job. No fevers, chills, nausea, or vomiting noted at this time. 11/01/18 on evaluation today patient presents after having not been seen in our clinic for several weeks secondary to the fact that he was on evaluation today patient presents after having not been seen in our clinic for several weeks secondary to the fact that he was in a skilled nursing facility which was on lockdown currently due to the covert 19 national emergency. Subsequently he was discharged from the facility on this past Friday and subsequently made an appointment to come in to see yesterday. Fortunately there's no signs of active infection at this time which is good news and  overall he does seem to have made progress since I last saw. Overall I feel like things are progressing quite nicely. The patient is having no pain. 11/08/18 on evaluation today patient appears to be doing okay in regard to his right gluteal ulcer. He has been utilizing a Wound VAC home health this changing this at this point since he's home from  the skilled nursing facility. Fortunately there's no signs of obvious active infection at this time. Unfortunately though there's no obvious active infection he is having some maceration and his wife states that when the sheets of the Wound VAC office on Sunday when it broke seal that he ended up having significant issues with some smell as well there concerned about the possibility of infection. Fortunately there's No fevers, chills, nausea, or vomiting noted at this time. VASCO, CHONG ([id number]) 11/15/18 on evaluation today patient actually appears to be doing well in regard to his right gluteal ulcer. He has been tolerating the dressing changes without complication. Specifically the Wound VAC has been utilized up to this point. Fortunately there's no signs of infection and overall I feel like he has made progress even since last week when I last saw him. I'm actually fairly happy with the overall appearance although he does seem to have somewhat of a hyper granular overgrowth in the central portion of the wound which I think may require some sharp debridement to try flatness out possibly utilizing chemical cauterization following. 11/23/18 on evaluation today patient actually appears to be doing very well in regard to his sacral ulcer. He seems to be showing signs of improvement with good granulation. With that being said he still has the small area of hyper granulation right in the central portion of the wound which I'm gonna likely utilize silver nitrate on today. Subsequently he also keeps having a leak at the 6 o'clock location which is unfortunate we may be able to help out with some suggestions to try to prevent this going forward. Fortunately there's no signs of active infection at this time. 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 He's been tolerating the dressing changes without  complication. Fortunately there's no signs of active infection at this time. I've been rather pleased with how things have progressed there still some evidence of pressure getting to the area with some redness right around the immediate wound opening. Nonetheless other than this I'm not seeing any significant complications or issues the wound is somewhat hyper granular. Upon discussing with the patient and his wife today I'm not sure that the wound is being packed to the base with the foam at this point. And if it's not been packed fully that may be part of the reason why is not seen as much improvement as far as the granulation from the base out. Again we do not want pack too tightly but we need some of the firm to get to the base of the wound. I discussed this with patient and his wife today. 12/06/18 on evaluation today patient appears to be doing well in regard to his right gluteal pressure ulcer. He's been tolerating the dressing changes without complication. Fortunately there's no signs of active infection. He still has some hyper granular tissue and I do think it would be appropriate to continue with the chemical cauterization as of today. 12/16/18 on evaluation today patient actually appears to be doing okay in regard  to his right gluteal ulcer. He is been tolerating the dressing changes without complication including the Wound VAC. Overall I feel like nothing seems to be worsening I do feel like that the hyper granulation buds in the central portion of the wound have improved to some degree with the silver nitrate. We will have to see how things continue to progress. 12/20/18 on evaluation today patient actually appears to be doing much worse in my pinion even compared to last week's evaluation. Unfortunately as opposed to showing any signs of improvement the areas of hyper granular tissue in the central portion of the wound seem to be getting worse. Subsequently the wound bed itself also seems  to be getting deeper even compared to last week which is both unusual as well as concerning since prior he had been shown signs of improvement. Nonetheless I think that the issue could be that he's actually having some difficulty in issues with a deeper infection. There's no external signs of infection but nonetheless I am more worried about the internal, osteomyelitis, that could be restarting. He has not been on antibiotics for some time at this point. I think that it may be a good idea to go ahead and started back on an antibiotic therapy while we wait to see what the testing shows. 12/27/18 on evaluation today patient presents for follow-up concerning his left gluteal fold wound. Fortunately he appears to be doing well today. I did review the CT scan which was negative for any signs of osteomyelitis or acute abnormality this is excellent news. Overall I feel like the surface of the wound bed appears to be doing significantly better today compared to previously noted findings. There does not appear any signs of infection nor does he have any pain at this time. 01/03/19 on evaluation today patient actually appears to be doing quite well in regard to his ulcer. Post debridement last week he really did not have too much bleeding which is good news. Fortunately today this seems to be doing some better but we still has some of the hyper granular tissue noted in the base of the wound which is gonna require sharp debridement today as well. Overall I'm pleased with how things seem to be progressing since we switched away from the Wound VAC I think he is making some progress. 01/10/19 on evaluation today patient appears to be doing better in regard to his right gluteal fold ulcer. He has been tolerating the dressing changes without complication. The debridement to seem to be helping with current away some of the poor hyper granular tissue bugs throughout the region of his gluteal fold wound. He's been  tolerating the dressing changes otherwise without complication which is great news. No fevers, chills, nausea, or vomiting noted at this time. 01/17/19 on evaluation today patient actually appears to be doing excellent in regard to his wound. He's been tolerating the dressing changes without complication. Fortunately there is no signs of active infection at this time which is great news. No fevers, chills, nausea, or vomiting noted at this time. KAYVAN, HOEFLING ([id number]) 01/24/19 on evaluation today patient actually appears to be doing quite well with regard to his ulcer. He has been tolerating the dressing changes without complication. Fortunately there's no signs of active infection at this time. Overall been very pleased with the progress that he seems to be making currently. 01/31/19-Patient returns at 1 week with apparent similarity in dimensions to the wound, with no signs of infection, he has been  changing dressings twice a day 02/08/19 upon evaluation today patient actually appears to be doing well with regard to his right Ischial ulcer. The wound is not appear to be quite as deep and seems to be making progress which is good news. With that being said I'm still reluctant to go back to the Wound VAC at this point. He's been having to change the dressings twice a day which is a little bit much in my pinion from the wound care supplies standpoint. I think that possibly attempting to utilize extras orbit may be beneficial this may also help to prevent any additional breakdown secondary to fluid retention in the wound itself. The patient is in agreement with giving this a try. 02/15/19 on evaluation today patient actually appears to be doing decently well with regard to his ulcer in the right to gluteal fold location. He's been tolerating the dressing changes without complication. Fortunately there is no signs of active infection at this time. He is able to keep the current dressing in place  more effectively for a day at a time whereas before he was having a changes to to three times a day. The actions or has been helpful in this regard. Fortunately there's no signs of anything getting worse and I do feel like he showing signs of good improvement with regard to the wound bed status. Patient History Information obtained from Patient. Family History Hypertension - Father, Stroke - Mother, No family history of Cancer, Diabetes, Heart Disease, Kidney Disease, Lung Disease, Seizures, Thyroid Problems, Tuberculosis. Social History Never smoker, Marital Status - Married, Alcohol Use - Never, Drug Use - No History, Caffeine Use - Daily. Medical History Eyes Patient has history of Cataracts - both removed Denies history of Glaucoma, Optic Neuritis Ear/Nose/Mouth/Throat Denies history of Chronic sinus problems/congestion, Middle ear problems Hematologic/Lymphatic Denies history of Anemia, Hemophilia, Human Immunodeficiency Virus, Lymphedema Respiratory Denies history of Aspiration, Asthma, Chronic Obstructive Pulmonary Disease (COPD), Pneumothorax, Sleep Apnea, Tuberculosis Cardiovascular Patient has history of Hypertension - takes medication Denies history of Angina, Arrhythmia, Congestive Heart Failure, Coronary Artery Disease, Deep Vein Thrombosis, Hypotension, Myocardial Infarction, Peripheral Arterial Disease, Peripheral Venous Disease, Phlebitis, Vasculitis Gastrointestinal Denies history of Cirrhosis , Colitis, Crohn s, Hepatitis A, Hepatitis B, Hepatitis C Endocrine Denies history of Type I Diabetes, Type II Diabetes Genitourinary Denies history of End Stage Renal Disease Immunological Denies history of Lupus Erythematosus, Raynaud s, Scleroderma Integumentary (Skin) Denies history of History of Burn, History of pressure wounds Musculoskeletal Denies history of Gout, Rheumatoid Arthritis, Osteoarthritis, Osteomyelitis Neurologic Linebaugh, Carder J.  ([id number]) Patient has history of Paraplegia - waist down Denies history of Dementia, Neuropathy, Quadriplegia, Seizure Disorder Oncologic Denies history of Received Chemotherapy, Received Radiation Psychiatric Denies history of Anorexia/bulimia, Confinement Anxiety Medical And Surgical History Notes Oncologic Prostate cancer- currently treated with horomone therapy Review of Systems (ROS) Constitutional Symptoms (General Health) Denies complaints or symptoms of Fatigue, Fever, Chills, Marked Weight Change. Respiratory Denies complaints or symptoms of Chronic or frequent coughs, Shortness of Breath. Cardiovascular Denies complaints or symptoms of Chest pain, LE edema. Psychiatric Denies complaints or symptoms of Anxiety, Claustrophobia. Objective Constitutional Well-nourished and well-hydrated in no acute distress. Vitals Time Taken: 8:10 AM, Height: 73 in, Weight: 210 lbs, BMI: 27.7, Temperature: 98.1 F, Pulse: 91 bpm, Respiratory Rate: 16 breaths/min, Blood Pressure: 142/80 mmHg. Respiratory normal breathing without difficulty. Psychiatric this patient is able to make decisions and demonstrates good insight into disease process. Alert and Oriented x 3. pleasant and cooperative. General Notes:  Patient's wound bed did require sharp agreement to remove some Slough and hyper granular tissue from the base of the wound he tolerated debridement today without complication post debridement wound bed appears to be doing much better. Integumentary (Hair, Skin) Wound #1 status is Open. Original cause of wound was Pressure Injury. The wound is located on the Right Gluteal fold. The wound measures 3.5cm length x 2.2cm width x 3cm depth; 6.048cm^2 area and 18.143cm^3 volume. There is muscle and Fat Layer (Subcutaneous Tissue) Exposed exposed. There is no undermining noted, however, there is tunneling at 12:00 with a maximum distance of 3.7cm. There is a large amount of serous drainage  noted. The wound margin is epibole. There is large (67-100%) red, pink, hyper - granulation within the wound bed. There is a small (1-33%) amount of necrotic tissue within the wound bed including Adherent Slough. 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 There was a Excisional Skin/Subcutaneous Tissue Debridement with a total area of 7.7 sq cm performed by [name], HOYT E., [name]. With the following instrument(s): Curette to remove Viable and Non-Viable tissue/material. Material removed includes Subcutaneous Tissue and Slough and after achieving pain control using Lidocaine 4% Topical Solution. No specimens were taken. A time out was conducted at 08:37, prior to the start of the procedure. A Minimum amount of bleeding was controlled with Pressure. The procedure was tolerated well with a pain level of 0 throughout and a pain level of 0 following the procedure. Post Debridement Measurements: 3.5cm length x 2.2cm width x 3.2cm depth; 19.352cm^3 volume. Post debridement Stage noted as Category/Stage IV. Character of Wound/Ulcer Post Debridement is improved. Post procedure Diagnosis Wound #1: Same as Pre-Procedure Plan Wound Cleansing: Wound #1 Right Gluteal fold: Other: - Cleanse wound with Dakin's Solution Anesthetic (add to Medication List): Wound #1 Right Gluteal fold: Topical Lidocaine 4% cream applied to wound bed prior to debridement (In Clinic Only). Skin Barriers/Peri-Wound Care: Wound #1 Right Gluteal fold: Other: - Apply zinc oxide to peri-wound area. Primary Wound Dressing: Wound #1 Right Gluteal fold: Silver Alginate - pack lightly into  wound Secondary Dressing: Wound #1 Right Gluteal fold: XtraSorb - Equivalent product may be used but please order a size that is appropriate for the wound size and location. Secure with Tegaderm Dressing Change Frequency: Wound #1 Right Gluteal fold: Change dressing every day. VINT, POLA ([id number]) Follow-up Appointments: Wound #1 Right Gluteal fold: Return Appointment in 1 week. Off-Loading: Wound #1 Right Gluteal fold: Turn and reposition every 2 hours Home Health: Wound #1 Right Gluteal fold: Continue Home Health Visits - PLEASE, have supplies for patient! Home Health Nurse may visit PRN to address patient s wound care needs. FACE TO FACE ENCOUNTER: MEDICARE and MEDICAID PATIENTS: I certify that this patient is under my care and that I had a face-to-face encounter that meets the physician face-to-face encounter requirements with this patient on this date. The encounter with the patient was in whole or in part for the following MEDICAL CONDITION: (primary reason for [place]) MEDICAL NECESSITY: I certify, that based on my findings, NURSING services are a medically necessary home health service. HOME BOUND STATUS: I certify that my clinical findings support that this patient is homebound (i.e., Due to illness  or injury, pt requires aid of supportive devices such as crutches, cane, wheelchairs, walkers, the use of special transportation or the assistance of another person to leave their place of residence. There is a normal inability to leave the home and doing so requires considerable and taxing effort. Other absences are for medical reasons / religious services and are infrequent or of short duration when for other reasons). If current dressing causes regression in wound condition, may D/C ordered dressing product/s and apply Normal Saline Moist Dressing daily until next [place] / Other MD appointment. [place] of regression in wound  condition at [phone]. Please direct any NON-WOUND related issues/requests for orders to patient's Primary Care Physician Based on patient's wound progress at this point I am definitely thinking that he's doing much better with the moisture control utilizing the alternate along with the extras orbit to cover over this. He's able to go for the full day which is good news to not having to change this multiple times a day. Subsequently my suggestion is gonna be that we continue with the above wound care measures for the next week and the patient is in agreement with that plan. The one thing we will change is having him clean the wound with Dakin s solution before reapplying the dressings. He is in agreement with this plan. We will send the updated orders to his home health agency as well. Please see above for specific wound care orders. We will see patient for re-evaluation in 1 week(s) here in the clinic. If anything worsens or changes patient will contact our office for additional recommendations. Electronic Signature(s) Signed: 02/16/2019 9:47:59 AM By: Worthy Keeler [name] Entered By: Worthy Keeler on 02/15/2019 23:09:08 THAER, MIYOSHI ([id number]) -------------------------------------------------------------------------------- ROS/PFSH Details Patient Name: [name] Date of Service: 02/15/2019 8:00 AM Medical Record Number: [id number] Patient Account Number: [id number] Date of Birth/Sex: [birth year] (68 y.o. M) Treating RN: Montey Hora Primary Care Provider: [name] Other Clinician: Referring Provider: [name] Treating Provider/Extender: [name], HOYT Weeks in Treatment: 52 Information Obtained From Patient Constitutional Symptoms (General Health) Complaints and Symptoms: Negative for: Fatigue; Fever; Chills; Marked Weight Change Respiratory Complaints and Symptoms: Negative for: Chronic or frequent coughs; Shortness of Breath Medical  History: Negative for: Aspiration; Asthma; Chronic Obstructive Pulmonary Disease (COPD); Pneumothorax; Sleep Apnea; Tuberculosis Cardiovascular Complaints and Symptoms: Negative for: Chest pain; LE edema Medical History: Positive for: Hypertension - takes medication Negative for: Angina; Arrhythmia; Congestive Heart Failure; Coronary Artery Disease; Deep Vein Thrombosis; Hypotension; Myocardial Infarction; Peripheral Arterial Disease; Peripheral Venous Disease; Phlebitis; Vasculitis Psychiatric Complaints and Symptoms: Negative for: Anxiety; Claustrophobia Medical History: Negative for: Anorexia/bulimia; Confinement Anxiety Eyes Medical History: Positive for: Cataracts - both removed Negative for: Glaucoma; Optic Neuritis Ear/Nose/Mouth/Throat Medical History: Negative for: Chronic sinus problems/congestion; Middle ear problems Hematologic/Lymphatic Medical History: Negative for: Anemia; Hemophilia; Human Immunodeficiency Virus; Lymphedema TURRELL, SEVERT. ([id number]) Gastrointestinal Medical History: Negative for: Cirrhosis ; Colitis; Crohnos; Hepatitis A; Hepatitis B; Hepatitis C Endocrine Medical History: Negative for: Type I Diabetes; Type II Diabetes Genitourinary Medical History: Negative for: End Stage Renal Disease Immunological Medical History: Negative for: Lupus Erythematosus; Raynaudos; Scleroderma Integumentary (Skin) Medical History: Negative for: History of Burn; History of pressure wounds Musculoskeletal Medical History: Negative for: Gout; Rheumatoid Arthritis; Osteoarthritis; Osteomyelitis Neurologic Medical History: Positive for: Paraplegia - waist down Negative for: Dementia; Neuropathy; Quadriplegia; Seizure Disorder Oncologic Medical History: Negative for: Received Chemotherapy; Received Radiation Past Medical History Notes: Prostate cancer- currently treated  with horomone therapy HBO Extended History  Items Eyes: Cataracts Immunizations Pneumococcal Vaccine: Received Pneumococcal Vaccination: No Implantable Devices No devices added Family and Social History Cancer: No; Diabetes: No; Heart Disease: No; Hypertension: Yes - Father; Kidney Disease: No; Lung Disease: No; Seizures: No; Stroke: Yes - Mother; Thyroid Problems: No; Tuberculosis: No; Never smoker; Marital Status - Married; Alcohol Use: Never; Drug Use: No History; Caffeine Use: Daily TANIS, BURNLEY ([id number]) Physician Affirmation I have reviewed and agree with the above information. Electronic Signature(s) Signed: 02/15/2019 4:49:07 PM By: Montey Hora Signed: 02/16/2019 9:47:59 AM By: Worthy Keeler [name] Entered By: Worthy Keeler on 02/15/2019 10:02:23 EDRIS, FRIEDT ([id number]) -------------------------------------------------------------------------------- SuperBill Details Patient Name: [name] Date of Service: 02/15/2019 Medical Record Number: [id number] Patient Account Number: [id number] Date of Birth/Sex: 1950-12-06 (68 y.o. 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

## 2019-02-16 NOTE — Progress Notes (Signed)
RAYLIN, WINER ([id number]) Visit Report for 02/15/2019 Arrival Information Details Patient Name: [name], [name] Date of Service: 02/15/2019 8:00 AM Medical Record Number: [id number] Patient Account Number: [id number] Date of Birth/Sex: Mar 21, 1951 (68 y.o. M) Treating RN: Montey Hora Primary Care Mahrosh Donnell: [name] Other Clinician: Referring Byrl Latin: [name] Treating Mariaha Ellington/Extender: [name], HOYT Weeks in Treatment: 94 Visit Information History Since Last Visit Added or deleted any medications: No Patient Arrived: Wheel Chair Any new allergies or adverse reactions: No Arrival Time: 08:11 Had a fall or experienced change in No Accompanied By: wife activities of daily living that may affect Transfer Assistance: None risk of falls: Patient Identification Verified: Yes Signs or symptoms of abuse/neglect since last visito No Secondary Verification Process Completed: Yes Hospitalized since last visit: No Patient Requires Transmission-Based No Implantable device outside of the clinic excluding No Precautions: cellular tissue based products placed in the center Patient Has Alerts: Yes since last visit: Patient Alerts: NOT Has Dressing in Place as Prescribed: Yes Diabetic Pain Present Now: Yes Electronic Signature(s) Signed: 02/15/2019 4:24:52 PM By: Lorine Bears RCP, RRT, CHT Entered By: Lorine Bears on 02/15/2019 08:12:19 [name], [name] ([id number]) -------------------------------------------------------------------------------- Encounter Discharge Information Details Patient Name: [name] Date of Service: 02/15/2019 8:00 AM Medical Record Number: [id number] Patient Account Number: [id number] Date of Birth/Sex: 07/02/1951 (68 y.o. M) Treating RN: Montey Hora Primary Care Narcissus Detwiler: [name] Other Clinician: Referring Klint Lezcano: [name] Treating Afifa Truax/Extender: [name],  HOYT Weeks in Treatment: 34 Encounter Discharge Information Items Post Procedure Vitals Discharge Condition: Stable Temperature (F): 98.1 Ambulatory Status: Wheelchair Pulse (bpm): 91 Discharge Destination: Home Respiratory Rate (breaths/min): 16 Transportation: Private Auto Blood Pressure (mmHg): 142/80 Accompanied By: wife Schedule Follow-up Appointment: Yes Clinical Summary of Care: Electronic Signature(s) Signed: 02/15/2019 4:49:07 PM By: Montey Hora Entered By: Montey Hora on 02/15/2019 08:44:39 [name] ([id number]) -------------------------------------------------------------------------------- Lower Extremity Assessment Details Patient Name: [name] Date of Service: 02/15/2019 8:00 AM Medical Record Number: [id number] Patient Account Number: [id number] Date of Birth/Sex: [birth year] (68 y.o. M) Treating RN: Army Melia Primary Care Vickye Astorino: [name] Other Clinician: Referring Devarius Nelles: [name] Treating Milady Fleener/Extender: [name], HOYT Weeks in Treatment: 62 Electronic Signature(s) Signed: 02/15/2019 11:40:32 AM By: Army Melia Entered By: Army Melia on 02/15/2019 08:21:01 [name], [name] ([id number]) -------------------------------------------------------------------------------- Multi Wound Chart Details Patient Name: [name] Date of Service: 02/15/2019 8:00 AM Medical Record Number: [id number] Patient Account Number: [id number] Date of Birth/Sex: 06-24-1951 (68 y.o. M) Treating RN: Montey Hora Primary Care Eilidh Marcano: [name] Other Clinician: Referring Emaad Nanna: [name] Treating Monserratt Knezevic/Extender: [name], HOYT Weeks in Treatment: 66 Vital Signs Height(in): 73 Pulse(bpm): 91 Weight(lbs): 210 Blood Pressure(mmHg): 142/80 Body Mass Index(BMI): 28 Temperature(F): 98.1 Respiratory Rate 16 (breaths/min): Photos: [N/A:N/A] Wound Location: Right Gluteal fold N/A  N/A Wounding Event: Pressure Injury N/A N/A Primary Etiology: Pressure Ulcer N/A N/A Comorbid History: Cataracts, Hypertension, N/A N/A Paraplegia Date Acquired: 11/02/2017 N/A N/A Weeks of Treatment: 67 N/A N/A Wound Status: Open N/A N/A Measurements L x W x D 3.5x2.2x3 N/A N/A (cm) Area (cm) : 6.048 N/A N/A Volume (cm) : 18.143 N/A N/A % Reduction in Area: 39.80% N/A N/A % Reduction in Volume: -1705.30% N/A N/A Position 1 (o'clock): 12 Maximum Distance 1 (cm): 3.7 Tunneling: Yes N/A N/A Classification: Category/Stage IV N/A N/A Exudate Amount: Large N/A N/A Exudate Type: Serous N/A N/A Exudate Color: amber N/A N/A Wound Margin: Epibole N/A N/A Granulation Amount: Large (67-100%) N/A N/A Granulation Quality:  Red, Pink, Hyper-granulation N/A N/A Necrotic Amount: Small (1-33%) N/A N/A Exposed Structures: Fat Layer (Subcutaneous N/A N/A Tissue) Exposed: Yes Muscle: Yes Fascia: No [name], [name]. ([id number]) Tendon: No Joint: No Bone: No Epithelialization: None N/A N/A Treatment Notes Electronic Signature(s) Signed: 02/15/2019 4:49:07 PM By: Montey Hora Entered By: Montey Hora on 02/15/2019 08:29:31 [name], [name] ([id number]) -------------------------------------------------------------------------------- [place]on Details Patient Name: [name] Date of Service: 02/15/2019 8:00 AM Medical Record Number: [id number] Patient Account Number: [id number] Date of Birth/Sex: 1951-04-15 (68 y.o. M) Treating RN: Montey Hora Primary Care Mahmood Boehringer: [name] Other Clinician: Referring Charlissa Petros: [name] Treating Elmin Wiederholt/Extender: [name], HOYT Weeks in Treatment: 21 Active Inactive Orientation to the Wound Care Program Nursing Diagnoses: Knowledge deficit related to the wound healing center program Goals: Patient/caregiver will verbalize understanding of the [place] Program Date Initiated:  11/30/2017 Target Resolution Date: 12/21/2017 Goal Status: Active Interventions: Provide education on orientation to the wound center Notes: Pressure Nursing Diagnoses: Knowledge deficit related to management of pressures ulcers Goals: Patient/caregiver will verbalize understanding of pressure ulcer management Date Initiated: 05/17/2018 Target Resolution Date: 05/28/2018 Goal Status: Active Interventions: Assess: immobility, friction, shearing, incontinence upon admission and as needed Notes: Wound/Skin Impairment Nursing Diagnoses: Impaired tissue integrity Goals: Patient/caregiver will verbalize understanding of skin care regimen Date Initiated: 11/30/2017 Target Resolution Date: 12/21/2017 Goal Status: Active Ulcer/skin breakdown will have a volume reduction of 30% by week 4 Date Initiated: 11/30/2017 Target Resolution Date: 12/21/2017 Goal Status: Active [name], [name] ([id number]) Interventions: Assess patient/caregiver ability to obtain necessary supplies Assess patient/caregiver ability to perform ulcer/skin care regimen upon admission and as needed Assess ulceration(s) every visit Treatment Activities: Skin care regimen initiated : 11/30/2017 Notes: Electronic Signature(s) Signed: 02/15/2019 4:49:07 PM By: Montey Hora Entered By: Montey Hora on 02/15/2019 08:29:19 [name], [name] ([id number]) -------------------------------------------------------------------------------- Pain Assessment Details Patient Name: [name] Date of Service: 02/15/2019 8:00 AM Medical Record Number: [id number] Patient Account Number: [id number] Date of Birth/Sex: 06-14-1951 (68 y.o. M) Treating RN: Montey Hora Primary Care Nkechi Linehan: [name] Other Clinician: Referring Keaghan Staton: [name] Treating Ulrick Methot/Extender: [name], HOYT Weeks in Treatment: 59 Active Problems Location of Pain Severity and Description of Pain Patient Has Paino Yes Site  Locations Rate the pain. Current Pain Level: 6 Pain Management and Medication Current Pain Management: Electronic Signature(s) Signed: 02/15/2019 4:24:52 PM By: Lorine Bears RCP, RRT, CHT Signed: 02/15/2019 4:49:07 PM By: Montey Hora Entered By: Lorine Bears on 02/15/2019 08:12:32 [name], [name] ([id number]) -------------------------------------------------------------------------------- Patient/Caregiver Education Details Patient Name: [name] Date of Service: 02/15/2019 8:00 AM Medical Record Number: [id number] Patient Account Number: [id number] Date of Birth/Gender: Nov 22, 1950 (68 y.o. M) Treating RN: Montey Hora Primary Care Physician: [name] Other Clinician: Referring Physician: [name] Treating Physician/Extender: Sharalyn Ink in Treatment: 28 Education Assessment Education Provided To: Patient Education Topics Provided Wound/Skin Impairment: Handouts: Other: wound care as ordered Methods: Demonstration, Explain/Verbal Responses: State content correctly Electronic Signature(s) Signed: 02/15/2019 4:49:07 PM By: Montey Hora Entered By: Montey Hora on 02/15/2019 08:33:04 [name] ([id number]) -------------------------------------------------------------------------------- Wound Assessment Details Patient Name: [name] Date of Service: 02/15/2019 8:00 AM Medical Record Number: [id number] Patient Account Number: [id number] Date of Birth/Sex: Dec 08, 1950 (68 y.o. M) Treating RN: Army Melia Primary Care Tamekia Rotter: [name] Other Clinician: Referring Safiyya Stokes: [name] Treating Kenika Sahm/Extender: [name], HOYT Weeks in Treatment: 63 Wound Status Wound Number: 1 Primary Etiology: Pressure Ulcer Wound Location: Right Gluteal fold Wound Status:  Open Wounding Event: Pressure Injury Comorbid History: Cataracts, Hypertension, Paraplegia Date  Acquired: 11/02/2017 Weeks Of Treatment: 63 Clustered Wound: No Photos Wound Measurements Length: (cm) 3.5 Width: (cm) 2.2 Depth: (cm) 3 Area: (cm) 6.048 Volume: (cm) 18.143 % Reduction in Area: 39.8% % Reduction in Volume: -1705.3% Epithelialization: None Tunneling: Yes Position (o'clock): 12 Maximum Distance: (cm) 3.7 Undermining: No Wound Description Classification: Category/Stage IV Wound Margin: Epibole Exudate Amount: Large Exudate Type: Serous Exudate Color: amber Foul Odor After Cleansing: No Slough/Fibrino Yes Wound Bed Granulation Amount: Large (67-100%) Exposed Structure Granulation Quality: Red, Pink, Hyper-granulation Fascia Exposed: No Necrotic Amount: Small (1-33%) Fat Layer (Subcutaneous Tissue) Exposed: Yes Necrotic Quality: Adherent Slough Tendon Exposed: No Muscle Exposed: Yes Necrosis of Muscle: No Joint Exposed: No [name], [name]. ([id number]) Bone Exposed: No Treatment Notes Wound #1 (Right Gluteal fold) Notes zinc to peri wound, silvercel, xtrasorb and tegaderm Electronic Signature(s) Signed: 02/15/2019 11:40:32 AM By: Army Melia Entered By: Army Melia on 02/15/2019 08:20:48 [name] ([id number]) -------------------------------------------------------------------------------- Vitals Details Patient Name: [name] Date of Service: 02/15/2019 8:00 AM Medical Record Number: [id number] Patient Account Number: [id number] Date of Birth/Sex: 07/10/1951 (68 y.o. M) Treating RN: Montey Hora Primary Care Laquanta Hummel: [name] Other Clinician: Referring Al Gagen: [name] Treating Donnis Pecha/Extender: [name], HOYT Weeks in Treatment: 24 Vital Signs Time Taken: 08:10 Temperature (F): 98.1 Height (in): 73 Pulse (bpm): 91 Weight (lbs): 210 Respiratory Rate (breaths/min): 16 Body Mass Index (BMI): 27.7 Blood Pressure (mmHg): 142/80 Reference Range: 80 - 120 mg / dl Electronic  Signature(s) Signed: 02/15/2019 4:24:52 PM By: Lorine Bears RCP, RRT, CHT Entered By: Lorine Bears on 02/15/2019 08:12:54

## 2019-02-22 ENCOUNTER — Other Ambulatory Visit: Payer: Self-pay

## 2019-02-22 ENCOUNTER — Encounter: Payer: Medicare Other | Attending: Physician Assistant | Admitting: Physician Assistant

## 2019-02-22 DIAGNOSIS — L03317 Cellulitis of buttock: Secondary | ICD-10-CM | POA: Diagnosis not present

## 2019-02-22 DIAGNOSIS — Z880 Allergy status to penicillin: Secondary | ICD-10-CM | POA: Diagnosis not present

## 2019-02-22 DIAGNOSIS — Z8249 Family history of ischemic heart disease and other diseases of the circulatory system: Secondary | ICD-10-CM | POA: Diagnosis not present

## 2019-02-22 DIAGNOSIS — L89314 Pressure ulcer of right buttock, stage 4: Secondary | ICD-10-CM | POA: Insufficient documentation

## 2019-02-22 DIAGNOSIS — Z79899 Other long term (current) drug therapy: Secondary | ICD-10-CM | POA: Diagnosis not present

## 2019-02-22 DIAGNOSIS — G8220 Paraplegia, unspecified: Secondary | ICD-10-CM | POA: Insufficient documentation

## 2019-02-22 DIAGNOSIS — Z881 Allergy status to other antibiotic agents status: Secondary | ICD-10-CM | POA: Diagnosis not present

## 2019-02-22 DIAGNOSIS — C61 Malignant neoplasm of prostate: Secondary | ICD-10-CM | POA: Insufficient documentation

## 2019-02-22 DIAGNOSIS — I10 Essential (primary) hypertension: Secondary | ICD-10-CM | POA: Diagnosis not present

## 2019-02-22 NOTE — Progress Notes (Addendum)
[name], Brett ([id number]) Visit Report for 02/22/2019 Chief Complaint Document Details Patient Name: [name], [name] Date of Service: 02/22/2019 8:00 AM Medical Record Number: [id number] Patient Account Number: [id number] Date of Birth/Sex: 18-Dec-1950 (68 y.o. M) Treating RN: Montey Hora Primary Care Provider: [name] Other Clinician: Referring Provider: [name] Treating Provider/Extender: [name], HOYT Weeks in Treatment: 17 Information Obtained from: Patient Chief Complaint Right gluteal fold ulcer Electronic Signature(s) Signed: 02/22/2019 8:27:39 AM By: Worthy Keeler [name] Entered By: Worthy Keeler on 02/22/2019 08:27:39 [name], [name] ([id number]) -------------------------------------------------------------------------------- Debridement Details Patient Name: [name] Date of Service: 02/22/2019 8:00 AM Medical Record Number: [id number] Patient Account Number: [id number] Date of Birth/Sex: 10-06-1950 (68 y.o. M) Treating RN: Montey Hora Primary Care Provider: [name] Other Clinician: Referring Provider: [name] Treating Provider/Extender: [name], HOYT Weeks in Treatment: 48 Debridement Performed for Wound #1 Right Gluteal fold Assessment: Performed By: Physician [name], HOYT E., [name] Debridement Type: Debridement Level of Consciousness (Pre- Awake and Alert procedure): Pre-procedure Verification/Time Yes - 08:34 Out Taken: Start Time: 08:34 Pain Control: Lidocaine 4% Topical Solution Total Area Debrided (L x W): 3.5 (cm) x 2 (cm) = 7 (cm) Tissue and other material Viable, Non-Viable, Slough, Subcutaneous, Slough debrided: Level: Skin/Subcutaneous Tissue Debridement Description: Excisional Instrument: Curette Bleeding: Minimum Hemostasis Achieved: Pressure End Time: 08:36 Procedural Pain: 0 Post Procedural Pain: 0 Response to Treatment: Procedure was tolerated well Level of  Consciousness Awake and Alert (Post-procedure): Post Debridement Measurements of Total Wound Length: (cm) 3.5 Stage: Category/Stage IV Width: (cm) 2 Depth: (cm) 3.1 Volume: (cm) 17.043 Character of Wound/Ulcer Post Improved Debridement: Post Procedure Diagnosis Same as Pre-procedure Electronic Signature(s) Signed: 02/22/2019 4:46:21 PM By: Montey Hora Signed: 02/22/2019 9:00:41 PM By: Worthy Keeler [name] Entered By: Montey Hora on 02/22/2019 08:36:59 [name], [name] ([id number]) -------------------------------------------------------------------------------- HPI Details Patient Name: [name] Date of Service: 02/22/2019 8:00 AM Medical Record Number: [id number] Patient Account Number: [id number] Date of Birth/Sex: 10/30/1950 (68 y.o. 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 He states that there is no history of diabetes that he is aware of. He does have issues with his prostate and is currently receiving treatment for this by way of oral medication. With that being said I do not have a lot of details in that regard. Nonetheless the patient presents today as a result of having been referred to [place] by another provider initially home health was set to come out and take care of his wound although due to the fact that he apparently drives he's not able to receive home health. His wife is therefore trying to help take care of this wound within although they have been struggling with what exactly to do at this point. She  states that she can do some things but she is definitely not a nurse and does have some issues with looking at blood. The good news is the wound does not appear to be too deep and is fairly superficial at this point. There is no slough noted there is some nonviable skin noted around the surface  of the wound and the perimeter at this point. The central portion of the wound appears to be very good with a dermal layer noted this does not appear to be again deep enough to extend it to subcutaneous tissue at this point. 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 In the pump he has Dilaudid and baclofen. 12/07/17 on evaluation today patient presents for follow-up concerning his ongoing lower back thigh ulcer on the right. He states that he did not get the supplies ordered and therefore has not really been able to perform the dressing changes as directed exactly. His wife was able to get some Boarder Foam Dressing's from the drugstore and subsequently has been using hydrogel which did help to a degree in the wound does appear to be able smaller. There is actually more drainage this week noted than previous. 12/21/17 on evaluation today patient appears to be doing rather well in regard to his right gluteal ulcer. He has been tolerating the dressing changes without complication. There does not appear to be any evidence of infection at this point in time. Overall the wound does seem to be making some progress as far as the edges are concerned there's not as much in the way of overlapping of the external wound edges and he has a good epithelium to wound bed border for the most part. This however is not true right at the 12 o'clock location over the span of a little over a centimeters which actually will require debridement today to clean this away and hopefully allow it to continue to heal more  appropriately. 12/28/17 on evaluation today patient appears to be doing rather well in regard to his ulcer in the left gluteal region. He's been tolerating the dressing changes without complication. Apparently he has had some difficulty getting his dressing material. Apparently there's been some confusion with ordering we're gonna check into this. Nonetheless overall he's been showing signs of improvement which is good news. Debridement is not required today. 01/04/18 on evaluation today patient presents for follow-up concerning his right gluteal ulcer. He has been tolerating the dressing changes fairly well. On inspection today it appears he may actually have some maceration them concerned about the fact that he may be developing too much moisture in and around the wound bed which can cause delay in healing. With that being said he unfortunately really has not showed significant signs of improvement since last week's evaluation in fact this may even be just the little bit/slightly larger. Nonetheless he's been having a lot of discomfort I'm not sure this is even related to the wound as he has no pain when I'm to breeding or otherwise cleaning the wound during evaluation today. Nonetheless this is something that we did recommend he talked to his pain specialist concerning. 01/11/18 on evaluation today patient appears to be doing better in regard to his ulceration. He has been tolerating the dressing changes without complication. With that being said overall there's no evidence of infection which is good news. The only thing is he did receive the hatch affair blue classic versus the ready nonetheless I feel like this is perfectly fine and appears to have done well for him over the past week. 01/25/18 on evaluation today patient's wound actually appears to be a little bit larger than during the last evaluation. The good [name], [name]. ([id number]) news is the majority of  the wound edges actually  appear to be fairly firmly attached to the wound bed unfortunately again we're not really making progress in regard to the size. Roughly the wound is about the same size as when I first saw him although again the wound margin/edges appear to be much better. 02/01/18 on evaluation today patient actually appears to be doing very well in regard to his wound. Applying the Prisma dry does seem to be better although he does still have issues with slow progression of the wound. There was a slight improvement compared to last week's measurements today. Nonetheless I have been considering other options as far as the possibility of Theraskin or even a snap vac. In general I'm not sure that the Theraskin due to location of the wound would be a very good idea. Nonetheless I do think that a snap vac could be a possibility for the patient and in fact I think this could even be an excellent way to manage the wound possibly seeing some improvement in a very rapid fashion here. Nonetheless this is something that we would need to get approved and I did have a lengthy conversation with the patient about this today. 02/08/18 on evaluation today patient appears to be doing a little better in regard to his ulcer. He has been tolerating the dressing changes without complication. Fortunately despite the fact that the wound is a little bit smaller it's not significantly so unfortunately. We have discussed the possibility of a snap vac we did check with insurance this is actually covered at this point. Fortunately there does not appear to be any sign of infection. Overall I'm fairly pleased with how things seem to be appearing at this point. 02/15/18 on evaluation today patient appears to be doing rather well in regard to his right gluteal ulcer. Unfortunately the snap vac did not stay in place with his sheer and friction this came loose and did not seem to maintain seal very well. He worked for about two days and it did seem  to do very well during that time according to his wife but in general this does not seem to be something that's gonna be beneficial for him long-term. I do believe we need to go back to standard dressings to see if we can find something that will be of benefit. 03/02/18- He is here in follow up evaluation; there is minimal change in the wound. He will continue with the same treatment plan, would consider changing to iodosrob/iodoflex if ulcer continues to to plateau. He will follow up next week 03/08/18 on evaluation today patient's wound actually appears to be about the same size as when I previously saw him several weeks back. Unfortunately he does have some slightly dark discoloration in the central portion of the wound which has me concerned about pressure injury. I do believe he may be sitting for too long a period of time in fact he tells me that "I probably sit for much too long". He does have some Slough noted on the surface of the wound and again as far as the size of the wound is concerned I'm really not seeing anything that seems to have improved significantly. 03/15/18 on evaluation today patient appears to be doing fairly well in regard to his ulcer. The wound measured pretty much about the same today compared to last week's evaluation when looking at his graph. With that being said the area of bruising/deep tissue injury that was noted last week I do not see  at this point. He did get a new cushion fortunately this does seem to be have been of benefit in [name] pinion. It does appear that he's been off of this more which is good news as well I think that is definitely showing in the overall wound measurements. With that being said I do believe that he needs to continue to offload I don't think that the fact this is doing better should be or is going to allow him to not have to offload and explain this to him as well. Overall he seems to be in agreement the plan I think he understands. The  overall appearance of the wound bed is improved compared to last week I think the Iodoflex has been beneficial in that regard. 03/29/18 on evaluation today patient actually appears to be doing rather well in regard to his wound from the overall appearance standpoint he does have some granulation although there's some Slough on the surface of the wound noted as well. With that being said he unfortunately has not improved in regard to the overall measurement of the wound in volume or in size. I did have a discussion with him very specifically about offloading today. He actually does work although he mainly is just sitting throughout the day. He tells me he offloads by "lifting himself up for 30 seconds off of his chair occasionally" purchase from [employer] which does seem to have helped. And he has a new cushion that he with that being said he's also able to stand some for a very short period of time but not significant enough I think to provide appropriate offloading. I think the biggest issue at this point with the wound and the fact is not healing as quickly as we would like is due to the fact that he is really not able to appropriately offload while at work. He states the beginning after his injury he actually had a bed at his job that he could lay on in order to offload and that does seem to have been of help back at that time. Nonetheless he had not done this in quite some time unfortunately. I think that could be helpful for him this is something I would like for him to look into. 04/05/18 on evaluation today patient actually presents for follow-up concerning his right gluteal ulcer. Again he really is not significantly improved even compared to last week. He has been tolerating the dressing changes without complication. With that being said fortunately there appears to be no evidence of infection at this time. He has been more proactive in trying to offload. [name], [name]  ([id number]) 04/12/18 on evaluation today patient actually appears to be doing a little better in regard to his wound and the right gluteal fold region. He's been tolerating the dressing changes since removing the oasis without complication. However he was having a lot of burning initially with the oasis in place. He's unsure of exactly why this was given so much discomfort but he assumes that it was the oasis itself causing the problem. Nonetheless this had to be removed after about three days in place although even those three days seem to have made a fairly good improvement in regard to the overall appearance of the wound bed. In fact is the first time that he's made any improvement from the standpoint of measurements in about six weeks. He continues to have no discomfort over the area of the wound itself which leads me to wonder why he  was having the burning with the oasis when he does not even feel the actual debridement's themselves. I am somewhat perplexed by this. 04/19/18 on evaluation today patient's wound actually appears to be showing signs of epithelialization around the edge of the wound and in general actually appears to be doing better which is good news. He did have the same burning after about three days with applying the Endoform last week in the same fashion that I would generally apply a skin substitute. This seems to indicate that it's not the oasis to cause the problem but potentially the moisture buildup that just causes things to burn or there may be some other reaction with the skin prep or Steri-Strips. Nonetheless I'm not sure that is gonna be able to tolerate any skin substitute for a long period of time. The good news is the wound actually appears to be doing better today compared to last week and does seem to finally be making some progress. 04/26/18 on evaluation today patient actually appears to be doing rather well in regard to his ulcer in the right gluteal fold.  He has been tolerating the dressing changes without complication which is good news. The Endoform does seem to be helping although he was a little bit more macerated this week. This seems to be an ongoing issue with fluid control at this point. Nonetheless I think we may be able to add something like Drawtex to help control the drainage. 05/03/18 on evaluation today patient appears to actually be doing better in regard to the overall appearance of his wound. He has been tolerating the dressing changes without complication. Fortunately there appears to be no evidence of infection at this time. I really feel like his wound has shown signs as of today of turning around last week I thought so as well and definitely he could be seen in this week's overall appearance and measurements. In general I'm very pleased with the fact that he finally seems to be making a steady but sure progress. The patient likewise is very pleased. 05/17/18 on evaluation today patient appears to be doing more poorly unfortunately in regard to his ulcer. He has been tolerating the dressing changes without complication. With that being said he tells me that in the past couple of days he and his wife have noticed that we did not seem to be doing quite as well is getting dark near the center. Subsequently upon evaluation today the wound actually does appear to be doing worse compared to previous. He has been tolerating the dressing changes otherwise and he states that he is not been sitting up anymore than he was in the past from what he tells me. Still he has continued to work he states "I'm tired of dealing with this and if I have to just go home and lay in the bed all the time that's what I'll do". Nonetheless I am concerned about the fact that this wound does appear to be deeper than what it was previous. 05/24/18 upon evaluation today patient actually presents after having been in the hospital due to what was presumed to  be sepsis secondary to the wound infection. He had an elevated white blood cell count between 14 and 15. With that being said he does seem to be doing somewhat better now. His wound still is giving him some trouble nonetheless and he is obviously concerned about the fact likely talked about that this does seem to go more deeply than previously noted. I did  review his wound culture which showed evidence of Staphylococcus aureus him and group B strep. Nonetheless he is on antibiotics, Levaquin, for this. Subsequently I did review his intake summary from the hospital as well. I also did look at the CT of the lumbar spine with contrast that was performed which showed no bone destruction to suggest lumbar disguises/osteomyelitis or sacral osteomyelitis. There was no paraspinal abscess. Nonetheless it appears this may have been more of just a soft tissue infection at this point which is good news. He still is nonetheless concerned about the wound which again I think is completely reasonable considering everything he's been through recently. 05/31/18 on evaluation today on evaluation today patient actually appears to be showing signs of his wound be a little bit deeper than what I would like to see. Fortunately he does not show any signs of significant infection although his temperature was 99 today he states he's been checking this at home and has not been elevated. Nonetheless with the undermining that I'm seeing at this point I am becoming more concerned about the wound I do think that offloading is a key factor here that is preventing the speedy recovery at this point. There does not appear to be any evidence of again over infection noted. He's been using Santyl currently. 06/07/18 the patient presents today for follow-up evaluation regarding the left ulcer in the gluteal region. He has been tolerating the Wound VAC fairly well. He is obviously very frustrated with this he states that to mean is really  getting in his way. There does not appear to be any evidence of infection at this time he does have a little bit of odor I do not necessarily associate this with infection just something that we sometimes notice with Wound VAC therapy. With that being said I can definitely catch a tone of discontentment overall in the patient's demeanor today. This when he was previously in the hospital [name], [name]. ([id number]) an CT scan was done of the lumbar region which did not reveal any signs of osteomyelitis. With that being said the pelvis in particular was not evaluated distinctly which means he could still have some osteonecrosis I. Nonetheless the Wound VAC was started on Thursday I do want to get this little bit more time before jumping to a CT scan of the pelvis although that is something that I might would recommend if were not see an improvement by that time. 06/14/18 on evaluation today patient actually appears to be doing about the same in regard to his right gluteal ulcer. Again he did have a CT scan of the lumbar spine unfortunately this did not include the pelvis. Nonetheless with the depth of the wound that I'm seeing today even despite the fact that I'm not seeing any evidence of overt cellulitis I believe there's a good chance that we may be dealing with osteomyelitis somewhere in the right Ischial region. No fevers, chills, nausea, or vomiting noted at this time. 06/21/18 on evaluation today patient actually appears to be doing about the same with regard to his wound. The tunnel at 6 o'clock really does not appear to be any deeper although it is a little bit wider. I think at this point you may want to start packing this with white phone. Unfortunately I have not got approval for the CT scan of the pelvis as of yet due to the fact that Medicare apparently has been denied it due to the diagnosis codes not being appropriate according to  Medicare for the test requested. With that being  said the patient cannot have an MRI and therefore this is the only option that we have as far as testing is concerned. The patient has had infection and was on antibiotics and been added code for cellulitis of the bottom to see if this will be appropriate for getting the test approved. Nonetheless I'm concerned about the infection have been spread deeper into the Ischial region. 06/28/18 on evaluation today patient actually appears to be doing rather well all things considered in regard to the right gluteal ulcer. He has been tolerating the dressing changes without complication. With that being said the Wound VAC he states does have to be replaced almost every day or at least reinforced unfortunately. Patient actually has his CT scan later this morning we should have the results by tomorrow. 07/05/18 on evaluation today patient presents for follow-up concerning his right Ischial ulcer. He did see the surgeon Dr. Lysle Pearl last week. They were actually very happy with him and felt like he spent a tremendous amount of time with them as far as discussing his situation was concerned. In the end Dr. Lysle Pearl did contact me as well and determine that he would not recommend any surgical intervention at this point as he felt like it would not be in the patient's best interest based on what he was seeing. He recommended a referral to infectious disease. Subsequently this is something that Dr. Ines Bloomer office is working on setting up for the patient. As far as evaluation today is concerned the patient's wound actually appears to be worse at this point. I am concerned about how things are progressing and specifically about infection. I do not feel like it's the deeper but the area of depth is definitely widening which does have me concerned. No fevers, chills, nausea, or vomiting noted at this time. I think that we do need initiate antibiotic therapy the patient has an allow allergy to amoxicillin/penicillin he states  that he gets a rash since childhood. Nonetheless she's never had the issues with [employer] or cephalosporins in general but he is aware of. 07/27/18 on evaluation today patient presents following admission to the hospital on 07/09/18. He was subsequently discharged on 07/20/18. On 07/15/18 the patient underwent irrigation and debridement was soft tissue biopsy and bone biopsy as well as placement of a Wound VAC in the OR by Dr. Celine Ahr. During the hospital course the patient was placed on a Wound VAC and recommended follow up with surgery in three weeks actually with Dr. Delaine Lame who is infectious disease. The patient was on vancomycin during the hospital course. 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 He also had a bone culture which revealed evidence of methicillin-resistant staph aureus. He is updated CT scan 07/09/18 reveals that he had progression of the which was performed on wound to breakdown down to the trochanter where he actually had irregularities there as well suggestive of osteomyelitis. This was a change just since 9 December when we last performed a CT scan. Obviously this one had gone downhill quite significantly and rapidly. At this point upon evaluation I feel like in general the patient's wound seems to be doing fairly well all things considered upon [name] evaluation today. Obviously this is larger and deeper than what I previously evaluated but at the same time he seems to be making some progress as far as the appearance of the granulation tissue is concerned. I'm happy in that regard.  No fevers, chills, nausea, or vomiting noted at this time. He is on IV vancomycin and Rocephin at the facility. He is currently in [facility]. 08/03/18 upon evaluation today patient's wound appears to be doing better in regard to the overall appearance at this point in time. Fortunately he's been tolerating the Wound VAC without complication and states that  the facility has been taking excellent care of the wound site. Overall I see some Slough noted on the surface which I am going to attempt sharp debridement today of but nonetheless other than this I feel like he's making progress. 08/09/18 on evaluation today patient's wound appears to be doing much better compared to even last week's evaluation. Do believe that the Wound VAC is been of great benefit for him. He has been tolerating the dressing changes that is the Wound [name], [name]. ([id number]) VAC without any complication and he has excellent granulation noted currently. There is no need for sharp debridement at this point. 08/16/18 on evaluation today patient actually appears to be doing very well in regard to the wound in the right gluteal fold region. This is showing signs of progress and again appears to be very healthy which is excellent news. Fortunately there is no sign of active infection by way of odor or drainage at this point. Overall I'm very pleased with how things stand. He seems to be tolerating the Wound VAC without complication. 08/23/18 on evaluation today patient actually appears to be doing better in regard to his wound. He has been tolerating the Wound VAC without complication and in fact it has been collecting a significant amount of drainage which I think is good news especially considering how the wound appears. Fortunately there is no signs of infection at this time definitely nothing appears to be worse which is good news. He has not been started on the Bactrim and Flagyl that was recommended by Dr. Delaine Lame yet. I did actually contact her office this morning in order to check and see were things are that regard their gonna be calling me back. 08/30/18 on evaluation today patient actually appears to show signs of excellent improvement today compared to last evaluation. The undermining is getting much better the wound seems to be feeling quite nicely and I'm very  pleased that the granulation in general. With that being said overall I feel like the patient has made excellent progress which is great news. No fevers, chills, nausea, or vomiting noted at this time. 09/06/18 on evaluation today patient actually appears to be doing rather well in regard to his right gluteal ulcer. This is showing signs of improvement in overall I'm very pleased with how things seem to be progressing. The patient likewise is please. Overall I see no evidence of infection he is about to complete his oral antibiotic regimen which is the end of the antibiotics for him in just about three days. 09/13/18 on evaluation today patient's right Ischial ulcer appears to be showing signs of continued improvement which is excellent news. He's been tolerating the dressing changes without complication. Fortunately there's no signs of infection and the wound that seems to be doing very well. 09/28/18 on evaluation today patient appears to be doing rather well in regard to his right Ischial ulcer. He's been tolerating the Wound VAC without complication he knows there's much less drainage than there used to be this obviously is not a bad thing in [name] pinion. There's no evidence of infection despite the fact is but nothing about  it now for several weeks. 10/04/18 on evaluation today patient appears to be doing better in regard to his right Ischial wound. He has been tolerating the Wound VAC without complication and I do believe that the silver nitrate last week was beneficial for him. Fortunately overall there's no evidence of active infection at this time which is great news. No fevers, chills, nausea, or vomiting noted at this time. 10/11/18 on evaluation today patient actually appears to be doing rather well in regard to his Ischial ulcer. He's been tolerating the Wound VAC still without complication I feel like this is doing a good job. No fevers, chills, nausea, or vomiting noted at  this time. 11/01/18 on evaluation today patient presents after having not been seen in our clinic for several weeks secondary to the fact that he was on evaluation today patient presents after having not been seen in our clinic for several weeks secondary to the fact that he was in a skilled nursing facility which was on lockdown currently due to the covert 19 national emergency. Subsequently he was discharged from the facility on this past Friday and subsequently made an appointment to come in to see yesterday. Fortunately there's no signs of active infection at this time which is good news and overall he does seem to have made progress since I last saw. Overall I feel like things are progressing quite nicely. The patient is having no pain. 11/08/18 on evaluation today patient appears to be doing okay in regard to his right gluteal ulcer. He has been utilizing a Wound VAC home health this changing this at this point since he's home from the skilled nursing facility. Fortunately there's no signs of obvious active infection at this time. Unfortunately though there's no obvious active infection he is having some maceration and his wife states that when the sheets of the Wound VAC office on Sunday when it broke seal that he ended up having significant issues with some smell as well there concerned about the possibility of infection. Fortunately there's No fevers, chills, nausea, or vomiting noted at this time. 11/15/18 on evaluation today patient actually appears to be doing well in regard to his right gluteal ulcer. He has been tolerating the dressing changes without complication. Specifically the Wound VAC has been utilized up to this point. Fortunately there's no signs of infection and overall I feel like he has made progress even since last week when I last saw him. I'm actually fairly happy with the overall appearance although he does seem to have somewhat of a hyper granular [place], Tyrik J.  ([id number]) overgrowth in the central portion of the wound which I think may require some sharp debridement to try flatness out possibly utilizing chemical cauterization following. 11/23/18 on evaluation today patient actually appears to be doing very well in regard to his sacral ulcer. He seems to be showing signs of improvement with good granulation. With that being said he still has the small area of hyper granulation right in the central portion of the wound which I'm gonna likely utilize silver nitrate on today. Subsequently he also keeps having a leak at the 6 o'clock location which is unfortunate we may be able to help out with some suggestions to try to prevent this going forward. Fortunately there's no signs of active infection at this time. 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 He's been tolerating the dressing changes  without complication. Fortunately there's no signs of active infection at this time. I've been rather pleased with how things have progressed there still some evidence of pressure getting to the area with some redness right around the immediate wound opening. Nonetheless other than this I'm not seeing any significant complications or issues the wound is somewhat hyper granular. Upon discussing with the patient and his wife today I'm not sure that the wound is being packed to the base with the foam at this point. And if it's not been packed fully that may be part of the reason why is not seen as much improvement as far as the granulation from the base out. Again we do not want pack too tightly but we need some of the firm to get to the base of the wound. I discussed this with patient and his wife today. 12/06/18 on evaluation today patient appears to be doing well in regard to his right gluteal pressure ulcer. He's been tolerating the dressing changes without complication.  Fortunately there's no signs of active infection. He still has some hyper granular tissue and I do think it would be appropriate to continue with the chemical cauterization as of today. 12/16/18 on evaluation today patient actually appears to be doing okay in regard to his right gluteal ulcer. He is been tolerating the dressing changes without complication including the Wound VAC. Overall I feel like nothing seems to be worsening I do feel like that the hyper granulation buds in the central portion of the wound have improved to some degree with the silver nitrate. We will have to see how things continue to progress. 12/20/18 on evaluation today patient actually appears to be doing much worse in [name] pinion even compared to last week's evaluation. Unfortunately as opposed to showing any signs of improvement the areas of hyper granular tissue in the central portion of the wound seem to be getting worse. Subsequently the wound bed itself also seems to be getting deeper even compared to last week which is both unusual as well as concerning since prior he had been shown signs of improvement. Nonetheless I think that the issue could be that he's actually having some difficulty in issues with a deeper infection. There's no external signs of infection but nonetheless I am more worried about the internal, osteomyelitis, that could be restarting. He has not been on antibiotics for some time at this point. I think that it may be a good idea to go ahead and started back on an antibiotic therapy while we wait to see what the testing shows. 12/27/18 on evaluation today patient presents for follow-up concerning his left gluteal fold wound. Fortunately he appears to be doing well today. I did review the CT scan which was negative for any signs of osteomyelitis or acute abnormality this is excellent news. Overall I feel like the surface of the wound bed appears to be doing significantly better today compared to previously  noted findings. There does not appear any signs of infection nor does he have any pain at this time. 01/03/19 on evaluation today patient actually appears to be doing quite well in regard to his ulcer. Post debridement last week he really did not have too much bleeding which is good news. Fortunately today this seems to be doing some better but we still has some of the hyper granular tissue noted in the base of the wound which is gonna require sharp debridement today as well. Overall I'm pleased with how things seem to  be progressing since we switched away from the Wound VAC I think he is making some progress. 01/10/19 on evaluation today patient appears to be doing better in regard to his right gluteal fold ulcer. He has been tolerating the dressing changes without complication. The debridement to seem to be helping with current away some of the poor hyper granular tissue bugs throughout the region of his gluteal fold wound. He's been tolerating the dressing changes otherwise without complication which is great news. No fevers, chills, nausea, or vomiting noted at this time. 01/17/19 on evaluation today patient actually appears to be doing excellent in regard to his wound. He's been tolerating the dressing changes without complication. Fortunately there is no signs of active infection at this time which is great news. No fevers, chills, nausea, or vomiting noted at this time. 01/24/19 on evaluation today patient actually appears to be doing quite well with regard to his ulcer. He has been tolerating the dressing changes without complication. Fortunately there's no signs of active infection at this time. Overall been very pleased with the progress that he seems to be making currently. 01/31/19-Patient returns at 1 week with apparent similarity in dimensions to the wound, with no signs of infection, he has been [name], [name]. ([id number]) changing dressings twice a day 02/08/19 upon evaluation today  patient actually appears to be doing well with regard to his right Ischial ulcer. The wound is not appear to be quite as deep and seems to be making progress which is good news. With that being said I'm still reluctant to go back to the Wound VAC at this point. He's been having to change the dressings twice a day which is a little bit much in [name] pinion from the wound care supplies standpoint. I think that possibly attempting to utilize extras orbit may be beneficial this may also help to prevent any additional breakdown secondary to fluid retention in the wound itself. The patient is in agreement with giving this a try. 02/15/19 on evaluation today patient actually appears to be doing decently well with regard to his ulcer in the right to gluteal fold location. He's been tolerating the dressing changes without complication. Fortunately there is no signs of active infection at this time. He is able to keep the current dressing in place more effectively for a day at a time whereas before he was having a changes to to three times a day. The actions or has been helpful in this regard. Fortunately there's no signs of anything getting worse and I do feel like he showing signs of good improvement with regard to the wound bed status. 02/22/2019 patient appears to be doing very well today with regard to his ulcer in the gluteal fold. Fortunately there is no signs of active infection and he has been tolerating the dressing changes without any complication. Overall extremely pleased with how things seem to be progressing. He has much less of the hyper granular projections within the wound these have slowly been debrided away and he seems to be doing well. The wound bed is more uniform. Electronic Signature(s) Signed: 02/22/2019 8:48:58 AM By: Worthy Keeler [name] Entered By: Worthy Keeler on 02/22/2019 08:48:58 DARRIO, BADE  ([id number]) -------------------------------------------------------------------------------- Physical Exam Details Patient Name: [name] Date of Service: 02/22/2019 8:00 AM Medical Record Number: [id number] Patient Account Number: [id number] Date of Birth/Sex: 09-09-1950 (68 y.o. M) Treating RN: Montey Hora Primary Care Provider: [name] Other Clinician: Referring Provider: [name] Treating Provider/Extender:  [name], HOYT Weeks in Treatment: 34 Constitutional Well-nourished and well-hydrated in no acute distress. Respiratory normal breathing without difficulty. Psychiatric this patient is able to make decisions and demonstrates good insight into disease process. Alert and Oriented x 3. pleasant and cooperative. Notes Patient's wound did not show any signs of significant slough buildup nor any issues with infection at this time he has been cleaning with the [facility] solution and then utilizing the alginate as previously directed. Sharp debridement was performed today to clean out the projections that were present although there were not many and again I do not feel like any new areas are growing and she is slowly clearing away what has already been there as far as these hyper granular tissue areas are concerned. Electronic Signature(s) Signed: 02/22/2019 8:49:40 AM By: Worthy Keeler [name] Entered By: Worthy Keeler on 02/22/2019 08:49:40 [name], [name] ([id number]) -------------------------------------------------------------------------------- Physician Orders Details Patient Name: [name] Date of Service: 02/22/2019 8:00 AM Medical Record Number: [id number] Patient Account Number: [id number] Date of Birth/Sex: 07/13/1951 (68 y.o. 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 Skin Barriers/Peri-Wound Care Wound #1 Right Gluteal fold o Other: - Apply zinc oxide to peri-wound area. Primary Wound Dressing Wound #1 Right Gluteal fold o Silver Alginate - pack lightly into wound Secondary Dressing Wound #1 Right Gluteal fold o XtraSorb - Equivalent product may be used but please order a size that is appropriate for the wound size and location. Secure with Tegaderm Dressing Change Frequency Wound #1 Right Gluteal fold o Change dressing every day. Follow-up Appointments Wound #1 Right Gluteal fold o Return Appointment in 1 week. Off-Loading Wound #1 Right Gluteal fold o Turn and reposition every 2 hours [name], [name] ([id number]) Home Health Wound #1 Right Gluteal fold o Continue Home Health Visits - PLEASE, have supplies for patient! o Home Health Nurse may visit PRN to address patientos wound care needs. o FACE TO FACE ENCOUNTER: MEDICARE and MEDICAID PATIENTS: I certify that this patient is under [name] care and that I had a face-to-face encounter that meets the physician face-to-face encounter requirements with this patient on this date. The encounter with the patient was in whole or in part for the following MEDICAL CONDITION: (primary reason for [place]) MEDICAL NECESSITY: I certify, that based on [name] findings, NURSING services are a medically necessary home health service. HOME BOUND  STATUS: I certify that [name] clinical findings support that this patient is homebound (i.e., Due to illness or injury, pt requires aid of supportive devices such as crutches, cane, wheelchairs, walkers, the use of special transportation or the assistance of another person to leave their place of residence. There is a normal inability to leave the home and doing so requires considerable and taxing effort. Other absences are for medical reasons / religious services and are infrequent or of short duration when for other reasons). o If current dressing causes regression in  wound condition, may D/C ordered dressing product/s and apply Normal Saline Moist Dressing daily until next [place] / Other MD appointment. [place] of regression in wound condition at [phone]. o Please direct any NON-WOUND related issues/requests for orders to patient's Primary Care Physician Electronic Signature(s) Signed: 02/22/2019 4:46:21 PM By: Montey Hora Signed: 02/22/2019 9:00:41 PM By: Worthy Keeler [name] Entered By: Montey Hora on 02/22/2019 08:35:50 [name], [name] ([id number]) -------------------------------------------------------------------------------- Problem List Details Patient Name: [name] Date of Service: 02/22/2019 8:00 AM Medical Record Number: [id number] Patient Account Number: [id number] Date of Birth/Sex: May 10, 1951 (68 y.o. M) Treating RN: Montey Hora Primary Care Provider: [name] Other Clinician: Referring Provider: [name] Treating Provider/Extender: [name], HOYT Weeks in Treatment: 46 Active Problems ICD-10 Evaluated Encounter Code Description Active Date Today Diagnosis L89.314 Pressure ulcer of right buttock, stage 4 11/30/2017 No Yes L03.317 Cellulitis of buttock 06/21/2018 No Yes G82.20 Paraplegia, unspecified 11/30/2017 No Yes S34.109S Unspecified injury to unspecified level of lumbar spinal cord,  11/30/2017 No Yes sequela I10 Essential (primary) hypertension 11/30/2017 No Yes Inactive Problems Resolved Problems Electronic Signature(s) Signed: 02/22/2019 8:25:55 AM By: Worthy Keeler [name] Entered By: Worthy Keeler on 02/22/2019 08:25:54 [name] ([id number]) -------------------------------------------------------------------------------- Progress Note Details Patient Name: [name] Date of Service: 02/22/2019 8:00 AM Medical Record Number: [id number] Patient Account Number: [id number] Date of Birth/Sex: February 17, 1951 (68 y.o. 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 He states that there is no history of diabetes that he is aware of. He does have issues with his prostate and is currently receiving treatment for this by way of oral medication. With that being said I do not have a lot of details in that regard. Nonetheless the patient presents today as a result of having been referred to [place] by another provider initially home health was set to come out and take care of his wound although due to the fact that he apparently drives he's not able to receive home health. His wife is therefore trying to help take care of this wound within although they have been struggling with what exactly to do at this point. She states that she can do some things but she is definitely not a nurse and does have some issues with looking at blood. The good news is the  wound does not appear to be too deep and is fairly superficial at this point. There is no slough noted there is some nonviable skin noted around the surface of the wound and the perimeter at this point. The central portion of the wound appears to be very good with a dermal layer noted this does not appear to be again deep enough to extend it to subcutaneous tissue at this point. 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 In the pump he has Dilaudid and baclofen. 12/07/17 on evaluation today patient presents for follow-up concerning his ongoing lower back  thigh ulcer on the right. He states that he did not get the supplies ordered and therefore has not really been able to perform the dressing changes as directed exactly. His wife was able to get some [place]er Foam Dressing's from the drugstore and subsequently has been using hydrogel which did help to a degree in the wound does appear to be able smaller. There is actually more drainage this week noted than previous. 12/21/17 on evaluation today patient appears to be doing rather well in regard to his right gluteal ulcer. He has been tolerating the dressing changes without complication. There does not appear to be any evidence of infection at this point in time. Overall the wound does seem to be making some progress as far as the edges are concerned there's not as much in the way of overlapping of the external wound edges and he has a good epithelium to wound bed border for the most part. This however is not true right at the 12 o'clock location over the span of a little over a centimeters which actually will require debridement today to clean this away and hopefully allow it to continue to heal more appropriately. 12/28/17 on evaluation today patient appears to be doing rather well in regard to his ulcer in the left gluteal region. He's  been tolerating the dressing changes without complication. Apparently he has had some difficulty getting his dressing material. Apparently there's been some confusion with ordering we're gonna check into this. Nonetheless overall he's been showing signs of improvement which is good news. Debridement is not required today. 01/04/18 on evaluation today patient presents for follow-up concerning his right gluteal ulcer. He has been tolerating the dressing changes fairly well. On inspection today it appears he may actually have some maceration them concerned about the fact that he may be developing too much moisture in and around the wound bed which can cause delay in healing. With that being said he unfortunately really has not showed significant signs of improvement since last week's evaluation in fact this may even be just the little bit/slightly larger. Nonetheless he's been having a lot of discomfort I'm not sure this is even related to the wound as he has no pain when I'm to breeding or otherwise cleaning the wound during evaluation today. Nonetheless this is something that we did recommend he talked to his pain specialist concerning. 01/11/18 on evaluation today patient appears to be doing better in regard to his ulceration. He has been tolerating the dressing Boney, Christopherjohn J. ([id number]) changes without complication. With that being said overall there's no evidence of infection which is good news. The only thing is he did receive the hatch affair blue classic versus the ready nonetheless I feel like this is perfectly fine and appears to have done well for him over the past week. 01/25/18 on evaluation today patient's wound actually appears to be a little bit larger than during the last evaluation. The good news is the majority of the wound edges actually appear to be fairly firmly attached to the wound bed unfortunately again we're not really making progress in regard to the size. Roughly the  wound is about the same size as when I first saw him although again the wound margin/edges appear to be much better. 02/01/18 on evaluation today patient actually appears to be doing very well in regard to his wound. Applying the Prisma dry does seem to be better although he does still have issues with slow progression of the wound. There  was a slight improvement compared to last week's measurements today. Nonetheless I have been considering other options as far as the possibility of Theraskin or even a snap vac. In general I'm not sure that the Theraskin due to location of the wound would be a very good idea. Nonetheless I do think that a snap vac could be a possibility for the patient and in fact I think this could even be an excellent way to manage the wound possibly seeing some improvement in a very rapid fashion here. Nonetheless this is something that we would need to get approved and I did have a lengthy conversation with the patient about this today. 02/08/18 on evaluation today patient appears to be doing a little better in regard to his ulcer. He has been tolerating the dressing changes without complication. Fortunately despite the fact that the wound is a little bit smaller it's not significantly so unfortunately. We have discussed the possibility of a snap vac we did check with insurance this is actually covered at this point. Fortunately there does not appear to be any sign of infection. Overall I'm fairly pleased with how things seem to be appearing at this point. 02/15/18 on evaluation today patient appears to be doing rather well in regard to his right gluteal ulcer. Unfortunately the snap vac did not stay in place with his sheer and friction this came loose and did not seem to maintain seal very well. He worked for about two days and it did seem to do very well during that time according to his wife but in general this does not seem to be something that's gonna be beneficial for him  long-term. I do believe we need to go back to standard dressings to see if we can find something that will be of benefit. 03/02/18- He is here in follow up evaluation; there is minimal change in the wound. He will continue with the same treatment plan, would consider changing to iodosrob/iodoflex if ulcer continues to to plateau. He will follow up next week 03/08/18 on evaluation today patient's wound actually appears to be about the same size as when I previously saw him several weeks back. Unfortunately he does have some slightly dark discoloration in the central portion of the wound which has me concerned about pressure injury. I do believe he may be sitting for too long a period of time in fact he tells me that "I probably sit for much too long". He does have some Slough noted on the surface of the wound and again as far as the size of the wound is concerned I'm really not seeing anything that seems to have improved significantly. 03/15/18 on evaluation today patient appears to be doing fairly well in regard to his ulcer. The wound measured pretty much about the same today compared to last week's evaluation when looking at his graph. With that being said the area of bruising/deep tissue injury that was noted last week I do not see at this point. He did get a new cushion fortunately this does seem to be have been of benefit in [name] pinion. It does appear that he's been off of this more which is good news as well I think that is definitely showing in the overall wound measurements. With that being said I do believe that he needs to continue to offload I don't think that the fact this is doing better should be or is going to allow him to not have to offload and explain this to  him as well. Overall he seems to be in agreement the plan I think he understands. The overall appearance of the wound bed is improved compared to last week I think the Iodoflex has been beneficial in that regard. 03/29/18 on  evaluation today patient actually appears to be doing rather well in regard to his wound from the overall appearance standpoint he does have some granulation although there's some Slough on the surface of the wound noted as well. With that being said he unfortunately has not improved in regard to the overall measurement of the wound in volume or in size. I did have a discussion with him very specifically about offloading today. He actually does work although he mainly is just sitting throughout the day. He tells me he offloads by "lifting himself up for 30 seconds off of his chair occasionally" purchase from [employer] which does seem to have helped. And he has a new cushion that he with that being said he's also able to stand some for a very short period of time but not significant enough I think to provide appropriate offloading. I think the biggest issue at this point with the wound and the fact is not healing as quickly as we would like is due to the fact that he is really not able to appropriately offload while at work. He states the beginning after his injury he actually had a bed at his job that he could lay on in order to offload and that does seem to have been of help back at that time. Nonetheless he had not done this in quite some time unfortunately. I think that could be helpful for him this is something I would like for him to look into. DETRIC, SCALISI ([id number]) 04/05/18 on evaluation today patient actually presents for follow-up concerning his right gluteal ulcer. Again he really is not significantly improved even compared to last week. He has been tolerating the dressing changes without complication. With that being said fortunately there appears to be no evidence of infection at this time. He has been more proactive in trying to offload. 04/12/18 on evaluation today patient actually appears to be doing a little better in regard to his wound and the right gluteal  fold region. He's been tolerating the dressing changes since removing the oasis without complication. However he was having a lot of burning initially with the oasis in place. He's unsure of exactly why this was given so much discomfort but he assumes that it was the oasis itself causing the problem. Nonetheless this had to be removed after about three days in place although even those three days seem to have made a fairly good improvement in regard to the overall appearance of the wound bed. In fact is the first time that he's made any improvement from the standpoint of measurements in about six weeks. He continues to have no discomfort over the area of the wound itself which leads me to wonder why he was having the burning with the oasis when he does not even feel the actual debridement's themselves. I am somewhat perplexed by this. 04/19/18 on evaluation today patient's wound actually appears to be showing signs of epithelialization around the edge of the wound and in general actually appears to be doing better which is good news. He did have the same burning after about three days with applying the Endoform last week in the same fashion that I would generally apply a skin substitute. This seems to indicate that it's  not the oasis to cause the problem but potentially the moisture buildup that just causes things to burn or there may be some other reaction with the skin prep or Steri-Strips. Nonetheless I'm not sure that is gonna be able to tolerate any skin substitute for a long period of time. The good news is the wound actually appears to be doing better today compared to last week and does seem to finally be making some progress. 04/26/18 on evaluation today patient actually appears to be doing rather well in regard to his ulcer in the right gluteal fold. He has been tolerating the dressing changes without complication which is good news. The Endoform does seem to be helping although he was a  little bit more macerated this week. This seems to be an ongoing issue with fluid control at this point. Nonetheless I think we may be able to add something like Drawtex to help control the drainage. 05/03/18 on evaluation today patient appears to actually be doing better in regard to the overall appearance of his wound. He has been tolerating the dressing changes without complication. Fortunately there appears to be no evidence of infection at this time. I really feel like his wound has shown signs as of today of turning around last week I thought so as well and definitely he could be seen in this week's overall appearance and measurements. In general I'm very pleased with the fact that he finally seems to be making a steady but sure progress. The patient likewise is very pleased. 05/17/18 on evaluation today patient appears to be doing more poorly unfortunately in regard to his ulcer. He has been tolerating the dressing changes without complication. With that being said he tells me that in the past couple of days he and his wife have noticed that we did not seem to be doing quite as well is getting dark near the center. Subsequently upon evaluation today the wound actually does appear to be doing worse compared to previous. He has been tolerating the dressing changes otherwise and he states that he is not been sitting up anymore than he was in the past from what he tells me. Still he has continued to work he states "I'm tired of dealing with this and if I have to just go home and lay in the bed all the time that's what I'll do". Nonetheless I am concerned about the fact that this wound does appear to be deeper than what it was previous. 05/24/18 upon evaluation today patient actually presents after having been in the hospital due to what was presumed to be sepsis secondary to the wound infection. He had an elevated white blood cell count between 14 and 15. With that being said he does seem to be  doing somewhat better now. His wound still is giving him some trouble nonetheless and he is obviously concerned about the fact likely talked about that this does seem to go more deeply than previously noted. I did review his wound culture which showed evidence of Staphylococcus aureus him and group B strep. Nonetheless he is on antibiotics, Levaquin, for this. Subsequently I did review his intake summary from the hospital as well. I also did look at the CT of the lumbar spine with contrast that was performed which showed no bone destruction to suggest lumbar disguises/osteomyelitis or sacral osteomyelitis. There was no paraspinal abscess. Nonetheless it appears this may have been more of just a soft tissue infection at this point which is good news. He  still is nonetheless concerned about the wound which again I think is completely reasonable considering everything he's been through recently. 05/31/18 on evaluation today on evaluation today patient actually appears to be showing signs of his wound be a little bit deeper than what I would like to see. Fortunately he does not show any signs of significant infection although his temperature was 99 today he states he's been checking this at home and has not been elevated. Nonetheless with the undermining that I'm seeing at this point I am becoming more concerned about the wound I do think that offloading is a key factor here that is preventing the speedy recovery at this point. There does not appear to be any evidence of again over infection noted. He's been using Santyl currently. MYCAH, FORMICA ([id number]) 06/07/18 the patient presents today for follow-up evaluation regarding the left ulcer in the gluteal region. He has been tolerating the Wound VAC fairly well. He is obviously very frustrated with this he states that to mean is really getting in his way. There does not appear to be any evidence of infection at this time he does have a little  bit of odor I do not necessarily associate this with infection just something that we sometimes notice with Wound VAC therapy. With that being said I can definitely catch a tone of discontentment overall in the patient's demeanor today. This when he was previously in the hospital an CT scan was done of the lumbar region which did not reveal any signs of osteomyelitis. With that being said the pelvis in particular was not evaluated distinctly which means he could still have some osteonecrosis I. Nonetheless the Wound VAC was started on Thursday I do want to get this little bit more time before jumping to a CT scan of the pelvis although that is something that I might would recommend if were not see an improvement by that time. 06/14/18 on evaluation today patient actually appears to be doing about the same in regard to his right gluteal ulcer. Again he did have a CT scan of the lumbar spine unfortunately this did not include the pelvis. Nonetheless with the depth of the wound that I'm seeing today even despite the fact that I'm not seeing any evidence of overt cellulitis I believe there's a good chance that we may be dealing with osteomyelitis somewhere in the right Ischial region. No fevers, chills, nausea, or vomiting noted at this time. 06/21/18 on evaluation today patient actually appears to be doing about the same with regard to his wound. The tunnel at 6 o'clock really does not appear to be any deeper although it is a little bit wider. I think at this point you may want to start packing this with white phone. Unfortunately I have not got approval for the CT scan of the pelvis as of yet due to the fact that Medicare apparently has been denied it due to the diagnosis codes not being appropriate according to Medicare for the test requested. With that being said the patient cannot have an MRI and therefore this is the only option that we have as far as testing is concerned. The patient has had  infection and was on antibiotics and been added code for cellulitis of the bottom to see if this will be appropriate for getting the test approved. Nonetheless I'm concerned about the infection have been spread deeper into the Ischial region. 06/28/18 on evaluation today patient actually appears to be doing rather well all things  considered in regard to the right gluteal ulcer. He has been tolerating the dressing changes without complication. With that being said the Wound VAC he states does have to be replaced almost every day or at least reinforced unfortunately. Patient actually has his CT scan later this morning we should have the results by tomorrow. 07/05/18 on evaluation today patient presents for follow-up concerning his right Ischial ulcer. He did see the surgeon Dr. Lysle Pearl last week. They were actually very happy with him and felt like he spent a tremendous amount of time with them as far as discussing his situation was concerned. In the end Dr. Lysle Pearl did contact me as well and determine that he would not recommend any surgical intervention at this point as he felt like it would not be in the patient's best interest based on what he was seeing. He recommended a referral to infectious disease. Subsequently this is something that Dr. Ines Bloomer office is working on setting up for the patient. As far as evaluation today is concerned the patient's wound actually appears to be worse at this point. I am concerned about how things are progressing and specifically about infection. I do not feel like it's the deeper but the area of depth is definitely widening which does have me concerned. No fevers, chills, nausea, or vomiting noted at this time. I think that we do need initiate antibiotic therapy the patient has an allow allergy to amoxicillin/penicillin he states that he gets a rash since childhood. Nonetheless she's never had the issues with Catholics or cephalosporins in general but he is aware  of. 07/27/18 on evaluation today patient presents following admission to the hospital on 07/09/18. He was subsequently discharged on 07/20/18. On 07/15/18 the patient underwent irrigation and debridement was soft tissue biopsy and bone biopsy as well as placement of a Wound VAC in the OR by Dr. Celine Ahr. During the hospital course the patient was placed on a Wound VAC and recommended follow up with surgery in three weeks actually with Dr. Delaine Lame who is infectious disease. The patient was on vancomycin during the hospital course. 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 He also had a bone culture which revealed evidence of methicillin-resistant staph aureus. He is updated CT scan 07/09/18 reveals that he had progression of the which was performed on wound to breakdown down to the trochanter where he actually had irregularities there as well suggestive of osteomyelitis. This was a change just since 9 December when we last performed a CT scan. Obviously this one had gone downhill quite significantly and rapidly. At this point upon evaluation I feel like in general the patient's wound seems to be doing fairly well all things considered upon [name] evaluation today. Obviously this is larger and deeper than what I previously evaluated but at the same time he seems to be making some progress as far as the appearance of the granulation tissue is concerned. I'm happy in that regard. No fevers, chills, nausea, or vomiting noted at this time. He is on IV vancomycin and Rocephin at the facility. He is currently in [facility]. 08/03/18 upon evaluation today patient's wound appears to be doing better in regard to the overall appearance at this point in time. Fortunately he's been tolerating the Wound VAC without complication and states that the facility has been taking SUSANA, GRIPP. ([id number]) excellent care of the wound site. Overall I see some Slough noted on the  surface which I am going to  attempt sharp debridement today of but nonetheless other than this I feel like he's making progress. 08/09/18 on evaluation today patient's wound appears to be doing much better compared to even last week's evaluation. Do believe that the Wound VAC is been of great benefit for him. He has been tolerating the dressing changes that is the Wound VAC without any complication and he has excellent granulation noted currently. There is no need for sharp debridement at this point. 08/16/18 on evaluation today patient actually appears to be doing very well in regard to the wound in the right gluteal fold region. This is showing signs of progress and again appears to be very healthy which is excellent news. Fortunately there is no sign of active infection by way of odor or drainage at this point. Overall I'm very pleased with how things stand. He seems to be tolerating the Wound VAC without complication. 08/23/18 on evaluation today patient actually appears to be doing better in regard to his wound. He has been tolerating the Wound VAC without complication and in fact it has been collecting a significant amount of drainage which I think is good news especially considering how the wound appears. Fortunately there is no signs of infection at this time definitely nothing appears to be worse which is good news. He has not been started on the Bactrim and Flagyl that was recommended by Dr. Delaine Lame yet. I did actually contact her office this morning in order to check and see were things are that regard their gonna be calling me back. 08/30/18 on evaluation today patient actually appears to show signs of excellent improvement today compared to last evaluation. The undermining is getting much better the wound seems to be feeling quite nicely and I'm very pleased that the granulation in general. With that being said overall I feel like the patient has made excellent progress which is great  news. No fevers, chills, nausea, or vomiting noted at this time. 09/06/18 on evaluation today patient actually appears to be doing rather well in regard to his right gluteal ulcer. This is showing signs of improvement in overall I'm very pleased with how things seem to be progressing. The patient likewise is please. Overall I see no evidence of infection he is about to complete his oral antibiotic regimen which is the end of the antibiotics for him in just about three days. 09/13/18 on evaluation today patient's right Ischial ulcer appears to be showing signs of continued improvement which is excellent news. He's been tolerating the dressing changes without complication. Fortunately there's no signs of infection and the wound that seems to be doing very well. 09/28/18 on evaluation today patient appears to be doing rather well in regard to his right Ischial ulcer. He's been tolerating the Wound VAC without complication he knows there's much less drainage than there used to be this obviously is not a bad thing in [name] pinion. There's no evidence of infection despite the fact is but nothing about it now for several weeks. 10/04/18 on evaluation today patient appears to be doing better in regard to his right Ischial wound. He has been tolerating the Wound VAC without complication and I do believe that the silver nitrate last week was beneficial for him. Fortunately overall there's no evidence of active infection at this time which is great news. No fevers, chills, nausea, or vomiting noted at this time. 10/11/18 on evaluation today patient actually appears to be doing rather well in regard to his Ischial ulcer. He's been  tolerating the Wound VAC still without complication I feel like this is doing a good job. No fevers, chills, nausea, or vomiting noted at this time. 11/01/18 on evaluation today patient presents after having not been seen in our clinic for several weeks secondary to the fact that he was on  evaluation today patient presents after having not been seen in our clinic for several weeks secondary to the fact that he was in a skilled nursing facility which was on lockdown currently due to the covert 19 national emergency. Subsequently he was discharged from the facility on this past Friday and subsequently made an appointment to come in to see yesterday. Fortunately there's no signs of active infection at this time which is good news and overall he does seem to have made progress since I last saw. Overall I feel like things are progressing quite nicely. The patient is having no pain. 11/08/18 on evaluation today patient appears to be doing okay in regard to his right gluteal ulcer. He has been utilizing a [facility] home health this changing this at this point since he's home from the skilled nursing facility. Fortunately there's no signs of obvious active infection at this time. Unfortunately though there's no obvious active infection he is having some maceration and his wife states that when the sheets of the [facility]ound [facility] office on Sunday when it broke seal that he ended up having significant issues with some smell as well there concerned about the possibility of infection. Fortunately there's No fevers, chills, nausea, or vomiting noted at this time. DAMONDRE, PFEIFLE ([id number]) 11/15/18 on evaluation today patient actually appears to be doing well in regard to his right gluteal ulcer. He has been tolerating the dressing changes without complication. Specifically the Wound VAC has been utilized up to this point. Fortunately there's no signs of infection and overall I feel like he has made progress even since last week when I last saw him. I'm actually fairly happy with the overall appearance although he does seem to have somewhat of a hyper granular overgrowth in the central portion of the wound which I think may require some sharp debridement to try flatness out possibly utilizing chemical  cauterization following. 11/23/18 on evaluation today patient actually appears to be doing very well in regard to his sacral ulcer. He seems to be showing signs of improvement with good granulation. With that being said he still has the small area of hyper granulation right in the central portion of the wound which I'm gonna likely utilize silver nitrate on today. Subsequently he also keeps having a leak at the 6 o'clock location which is unfortunate we may be able to help out with some suggestions to try to prevent this going forward. Fortunately there's no signs of active infection at this time. 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 He's been tolerating the dressing changes without complication. Fortunately there's no signs of active infection at this time. I've been rather pleased with how things have progressed there still some evidence of pressure getting to the area with some redness right around the immediate wound opening. Nonetheless other than this I'm not seeing any significant complications or issues the wound is somewhat hyper granular. Upon discussing with the patient and his wife today I'm not sure that the wound is being packed to the base with the foam at this point. And if it's not been  packed fully that may be part of the reason why is not seen as much improvement as far as the granulation from the base out. Again we do not want pack too tightly but we need some of the firm to get to the base of the wound. I discussed this with patient and his wife today. 12/06/18 on evaluation today patient appears to be doing well in regard to his right gluteal pressure ulcer. He's been tolerating the dressing changes without complication. Fortunately there's no signs of active infection. He still has some hyper granular tissue and I do think it would be appropriate to continue with the chemical  cauterization as of today. 12/16/18 on evaluation today patient actually appears to be doing okay in regard to his right gluteal ulcer. He is been tolerating the dressing changes without complication including the Wound VAC. Overall I feel like nothing seems to be worsening I do feel like that the hyper granulation buds in the central portion of the wound have improved to some degree with the silver nitrate. We will have to see how things continue to progress. 12/20/18 on evaluation today patient actually appears to be doing much worse in [name] pinion even compared to last week's evaluation. Unfortunately as opposed to showing any signs of improvement the areas of hyper granular tissue in the central portion of the wound seem to be getting worse. Subsequently the wound bed itself also seems to be getting deeper even compared to last week which is both unusual as well as concerning since prior he had been shown signs of improvement. Nonetheless I think that the issue could be that he's actually having some difficulty in issues with a deeper infection. There's no external signs of infection but nonetheless I am more worried about the internal, osteomyelitis, that could be restarting. He has not been on antibiotics for some time at this point. I think that it may be a good idea to go ahead and started back on an antibiotic therapy while we wait to see what the testing shows. 12/27/18 on evaluation today patient presents for follow-up concerning his left gluteal fold wound. Fortunately he appears to be doing well today. I did review the CT scan which was negative for any signs of osteomyelitis or acute abnormality this is excellent news. Overall I feel like the surface of the wound bed appears to be doing significantly better today compared to previously noted findings. There does not appear any signs of infection nor does he have any pain at this time. 01/03/19 on evaluation today patient actually appears to be  doing quite well in regard to his ulcer. Post debridement last week he really did not have too much bleeding which is good news. Fortunately today this seems to be doing some better but we still has some of the hyper granular tissue noted in the base of the wound which is gonna require sharp debridement today as well. Overall I'm pleased with how things seem to be progressing since we switched away from the Wound VAC I think he is making some progress. 01/10/19 on evaluation today patient appears to be doing better in regard to his right gluteal fold ulcer. He has been tolerating the dressing changes without complication. The debridement to seem to be helping with current away some of the poor hyper granular tissue bugs throughout the region of his gluteal fold wound. He's been tolerating the dressing changes otherwise without complication which is great news. No fevers, chills, nausea, or vomiting noted  at this time. 01/17/19 on evaluation today patient actually appears to be doing excellent in regard to his wound. He's been tolerating the dressing changes without complication. Fortunately there is no signs of active infection at this time which is great news. No fevers, chills, nausea, or vomiting noted at this time. TYDARIUS, YAWN ([id number]) 01/24/19 on evaluation today patient actually appears to be doing quite well with regard to his ulcer. He has been tolerating the dressing changes without complication. Fortunately there's no signs of active infection at this time. Overall been very pleased with the progress that he seems to be making currently. 01/31/19-Patient returns at 1 week with apparent similarity in dimensions to the wound, with no signs of infection, he has been changing dressings twice a day 02/08/19 upon evaluation today patient actually appears to be doing well with regard to his right Ischial ulcer. The wound is not appear to be quite as deep and seems to be making progress  which is good news. With that being said I'm still reluctant to go back to the Wound VAC at this point. He's been having to change the dressings twice a day which is a little bit much in [name] pinion from the wound care supplies standpoint. I think that possibly attempting to utilize extras orbit may be beneficial this may also help to prevent any additional breakdown secondary to fluid retention in the wound itself. The patient is in agreement with giving this a try. 02/15/19 on evaluation today patient actually appears to be doing decently well with regard to his ulcer in the right to gluteal fold location. He's been tolerating the dressing changes without complication. Fortunately there is no signs of active infection at this time. He is able to keep the current dressing in place more effectively for a day at a time whereas before he was having a changes to to three times a day. The actions or has been helpful in this regard. Fortunately there's no signs of anything getting worse and I do feel like he showing signs of good improvement with regard to the wound bed status. 02/22/2019 patient appears to be doing very well today with regard to his ulcer in the gluteal fold. Fortunately there is no signs of active infection and he has been tolerating the dressing changes without any complication. Overall extremely pleased with how things seem to be progressing. He has much less of the hyper granular projections within the wound these have slowly been debrided away and he seems to be doing well. The wound bed is more uniform. Patient History Information obtained from Patient. Family History Hypertension - Father, Stroke - Mother, No family history of Cancer, Diabetes, Heart Disease, Kidney Disease, Lung Disease, Seizures, Thyroid Problems, Tuberculosis. Social History Never smoker, Marital Status - Married, Alcohol Use - Never, Drug Use - No History, Caffeine Use - Daily. Medical History Eyes Patient  has history of Cataracts - both removed Denies history of Glaucoma, Optic Neuritis Ear/Nose/Mouth/Throat Denies history of Chronic sinus problems/congestion, Middle ear problems Hematologic/Lymphatic Denies history of Anemia, Hemophilia, Human Immunodeficiency Virus, Lymphedema Respiratory Denies history of Aspiration, Asthma, Chronic Obstructive Pulmonary Disease (COPD), Pneumothorax, Sleep Apnea, Tuberculosis Cardiovascular Patient has history of Hypertension - takes medication Denies history of Angina, Arrhythmia, Congestive Heart Failure, Coronary Artery Disease, Deep Vein Thrombosis, Hypotension, Myocardial Infarction, Peripheral Arterial Disease, Peripheral Venous Disease, Phlebitis, Vasculitis Gastrointestinal Denies history of Cirrhosis , Colitis, Crohn s, Hepatitis A, Hepatitis B, Hepatitis C Endocrine Denies history of Type I Diabetes, Type  II Diabetes Genitourinary Denies history of End Stage Renal Disease Immunological Denies history of Lupus Erythematosus, Raynaud s, Scleroderma Narducci, Jaymien J. ([id number]) Integumentary (Skin) Denies history of History of Burn, History of pressure wounds Musculoskeletal Denies history of Gout, Rheumatoid Arthritis, Osteoarthritis, Osteomyelitis Neurologic Patient has history of Paraplegia - waist down Denies history of Dementia, Neuropathy, Quadriplegia, Seizure Disorder Oncologic Denies history of Received Chemotherapy, Received Radiation Psychiatric Denies history of Anorexia/bulimia, Confinement Anxiety Medical And Surgical History Notes Oncologic Prostate cancer- currently treated with horomone therapy Review of Systems (ROS) Constitutional Symptoms (General Health) Denies complaints or symptoms of Fatigue, Fever, Chills, Marked Weight Change. Respiratory Denies complaints or symptoms of Chronic or frequent coughs, Shortness of Breath. Cardiovascular Denies complaints or symptoms of Chest pain, LE  edema. Psychiatric Denies complaints or symptoms of Anxiety, Claustrophobia. Objective Constitutional Well-nourished and well-hydrated in no acute distress. Vitals Time Taken: 8:05 AM, Height: 73 in, Weight: 210 lbs, BMI: 27.7, Temperature: 98.9 F, Pulse: 80 bpm, Respiratory Rate: 16 breaths/min, Blood Pressure: 142/80 mmHg. Respiratory normal breathing without difficulty. Psychiatric this patient is able to make decisions and demonstrates good insight into disease process. Alert and Oriented x 3. pleasant and cooperative. General Notes: Patient's wound did not show any signs of significant slough buildup nor any issues with infection at this time he has been cleaning with the [facility] solution and then utilizing the alginate as previously directed. Sharp debridement was performed today to clean out the projections that were present although there were not many and again I do not feel like any new areas are growing and she is slowly clearing away what has already been there as far as these hyper granular tissue areas are concerned. Integumentary (Hair, Skin) Wound #1 status is Open. Original cause of wound was Pressure Injury. The wound is located on the Right Gluteal fold. The ALPHONSUS, DOYEL ([id number]) wound measures 3.5cm length x 2cm width x 3cm depth; 5.498cm^2 area and 16.493cm^3 volume. There is muscle and Fat Layer (Subcutaneous Tissue) Exposed exposed. There is tunneling at 6:00 with a maximum distance of 3.5cm. There is a large amount of serous drainage noted. The wound margin is epibole. There is large (67-100%) red, pink, hyper - granulation within the wound bed. There is a small (1-33%) amount of necrotic tissue within the wound bed including Adherent Slough. 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 There was a Excisional Skin/Subcutaneous Tissue Debridement with a total area of 7 sq cm performed by [name], HOYT E., [name]. With the following instrument(s): Curette to remove Viable and Non-Viable tissue/material. Material removed includes Subcutaneous Tissue and Slough and after achieving pain control using Lidocaine 4% Topical Solution. No specimens were taken. A time out was conducted at 08:34, prior to the start of the procedure. A Minimum amount of bleeding was controlled with Pressure. The procedure was tolerated well with a pain level of 0 throughout and a pain level of 0 following the procedure. Post Debridement Measurements: 3.5cm length x 2cm width x 3.1cm depth; 17.043cm^3 volume. Post debridement Stage noted as Category/Stage IV. Character of Wound/Ulcer Post Debridement is improved. Post procedure Diagnosis Wound #1: Same as Pre-Procedure Plan Wound Cleansing: Wound #1 Right Gluteal fold: Other: - Cleanse wound with Dakin's Solution Anesthetic (add to  Medication List): Wound #1 Right Gluteal fold: Topical Lidocaine 4% cream applied to wound bed prior to debridement (In Clinic Only). Skin Barriers/Peri-Wound Care: Wound #1 Right Gluteal fold: Other: - Apply zinc oxide to peri-wound area. Primary Wound Dressing: Wound #1 Right Gluteal fold: Silver Alginate - pack lightly into wound Secondary Dressing: NAJI, MEHRINGER ([id number]) Wound #1 Right Gluteal fold: XtraSorb - Equivalent product may be used but please order a size that is appropriate for the wound size and location. Secure with Tegaderm Dressing Change Frequency: Wound #1 Right Gluteal fold: Change dressing every day. Follow-up Appointments: Wound #1 Right Gluteal fold: Return Appointment in 1 week. Off-Loading: Wound #1 Right Gluteal fold: Turn and reposition every 2  hours Home Health: Wound #1 Right Gluteal fold: Continue Home Health Visits - PLEASE, have supplies for patient! Home Health Nurse may visit PRN to address patient s wound care needs. FACE TO FACE ENCOUNTER: MEDICARE and MEDICAID PATIENTS: I certify that this patient is under [name] care and that I had a face-to-face encounter that meets the physician face-to-face encounter requirements with this patient on this date. The encounter with the patient was in whole or in part for the following MEDICAL CONDITION: (primary reason for [place]) MEDICAL NECESSITY: I certify, that based on [name] findings, NURSING services are a medically necessary home health service. HOME BOUND STATUS: I certify that [name] clinical findings support that this patient is homebound (i.e., Due to illness or injury, pt requires aid of supportive devices such as crutches, cane, wheelchairs, walkers, the use of special transportation or the assistance of another person to leave their place of residence. There is a normal inability to leave the home and doing so requires considerable and taxing effort. Other absences are for medical reasons / religious services and are infrequent or of short duration when for other reasons). If current dressing causes regression in wound condition, may D/C ordered dressing product/s and apply Normal Saline Moist Dressing daily until next [place] / Other MD appointment. [place] of regression in wound condition at [phone]. Please direct any NON-WOUND related issues/requests for orders to patient's Primary Care Physician 1. I would recommend that we continue to clean the area with Dakin's solution since this seems to be beneficial for the patient and keeping everything clean and infection free and very pleased in this regard. 2. We will continue to use the silver alginate dressing to packed into the wound. 3. Using the extraoral externally does help to be able to  appropriately manage the moisture so this does not have to be changed a couple times a day this is good news as well. 4. I recommend that the patient continue appropriate offloading in order to keep pressure off of the wound as well this is extremely important in [name] opinion and again I think is still 1 of the most important things as far as getting these areas to heal. He voiced understanding. We will see patient back for reevaluation in 1 week here in the clinic. If anything worsens or changes patient will contact our office for additional recommendations. Electronic Signature(s) Signed: 02/22/2019 8:50:48 AM By: Worthy Keeler [name] Entered By: Worthy Keeler on 02/22/2019 08:50:48 TRAFTON, ROKER ([id number]) -------------------------------------------------------------------------------- ROS/PFSH Details Patient Name: [name] Date of Service: 02/22/2019 8:00 AM Medical Record Number: [id number] Patient Account Number: [id number] Date of Birth/Sex: [birth year] (68 y.o. M) Treating RN: Montey Hora Primary Care Provider: [name] Other Clinician: Referring Provider: [name]  Treating Provider/Extender: [name], HOYT Weeks in Treatment: 24 Information Obtained From Patient Constitutional Symptoms (General Health) Complaints and Symptoms: Negative for: Fatigue; Fever; Chills; Marked Weight Change Respiratory Complaints and Symptoms: Negative for: Chronic or frequent coughs; Shortness of Breath Medical History: Negative for: Aspiration; Asthma; Chronic Obstructive Pulmonary Disease (COPD); Pneumothorax; Sleep Apnea; Tuberculosis Cardiovascular Complaints and Symptoms: Negative for: Chest pain; LE edema Medical History: Positive for: Hypertension - takes medication Negative for: Angina; Arrhythmia; Congestive Heart Failure; Coronary Artery Disease; Deep Vein Thrombosis; Hypotension; Myocardial Infarction; Peripheral Arterial Disease; Peripheral  Venous Disease; Phlebitis; Vasculitis Psychiatric Complaints and Symptoms: Negative for: Anxiety; Claustrophobia Medical History: Negative for: Anorexia/bulimia; Confinement Anxiety Eyes Medical History: Positive for: Cataracts - both removed Negative for: Glaucoma; Optic Neuritis Ear/Nose/Mouth/Throat Medical History: Negative for: Chronic sinus problems/congestion; Middle ear problems Hematologic/Lymphatic Medical History: Negative for: Anemia; Hemophilia; Human Immunodeficiency Virus; Lymphedema AXIEL, FJELD. ([id number]) Gastrointestinal Medical History: Negative for: Cirrhosis ; Colitis; Crohnos; Hepatitis A; Hepatitis B; Hepatitis C Endocrine Medical History: Negative for: Type I Diabetes; Type II Diabetes Genitourinary Medical History: Negative for: End Stage Renal Disease Immunological Medical History: Negative for: Lupus Erythematosus; Raynaudos; Scleroderma Integumentary (Skin) Medical History: Negative for: History of Burn; History of pressure wounds Musculoskeletal Medical History: Negative for: Gout; Rheumatoid Arthritis; Osteoarthritis; Osteomyelitis Neurologic Medical History: Positive for: Paraplegia - waist down Negative for: Dementia; Neuropathy; Quadriplegia; Seizure Disorder Oncologic Medical History: Negative for: Received Chemotherapy; Received Radiation Past Medical History Notes: Prostate cancer- currently treated with horomone therapy HBO Extended History Items Eyes: Cataracts Immunizations Pneumococcal Vaccine: Received Pneumococcal Vaccination: No Implantable Devices No devices added Family and Social History Cancer: No; Diabetes: No; Heart Disease: No; Hypertension: Yes - Father; Kidney Disease: No; Lung Disease: No; Seizures: No; Stroke: Yes - Mother; Thyroid Problems: No; Tuberculosis: No; Never smoker; Marital Status - Married; Alcohol Use: Never; Drug Use: No History; Caffeine Use: Daily BRITTNEY, CARAWAY  ([id number]) Physician Affirmation I have reviewed and agree with the above information. Electronic Signature(s) Signed: 02/22/2019 4:46:21 PM By: Montey Hora Signed: 02/22/2019 9:00:41 PM By: Worthy Keeler [name] Entered By: Worthy Keeler on 02/22/2019 08:49:13 KEYTON, BHAT ([id number]) -------------------------------------------------------------------------------- SuperBill Details Patient Name: [name] Date of Service: 02/22/2019 Medical Record Number: [id number] Patient Account Number: [id number] Date of Birth/Sex: Nov 20, 1950 (68 y.o. 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

## 2019-02-25 NOTE — Progress Notes (Signed)
Brett, [name] ([id number]) Visit Report for 02/22/2019 Arrival Information Details Patient Name: [name], [name] Date of Service: 02/22/2019 8:00 AM Medical Record Number: [id number] Patient Account Number: [id number] Date of Birth/Sex: 1951/04/26 (68 y.o. M) Treating RN: [name] Primary Care Latifa Noble: [name] Other Clinician: Referring Ahlijah Raia: [name] Treating Reid Regas/Extender: [name], HOYT Weeks in Treatment: 72 Visit Information History Since Last Visit Added or deleted any medications: No Patient Arrived: Wheel Chair Any new allergies or adverse reactions: No Arrival Time: 08:08 Had a fall or experienced change in No Accompanied By: wife activities of daily living that may affect Transfer Assistance: None risk of falls: Patient Identification Verified: Yes Signs or symptoms of abuse/neglect since last visito No Secondary Verification Process Completed: Yes Hospitalized since last visit: No Patient Requires Transmission-Based No Implantable device outside of the clinic excluding No Precautions: cellular tissue based products placed in the center Patient Has Alerts: Yes since last visit: Patient Alerts: NOT Has Dressing in Place as Prescribed: Yes Diabetic Pain Present Now: Yes Electronic Signature(s) Signed: 02/25/2019 5:41:45 PM By: Gretta Cool, BSN, RN, CWS, Kim RN, BSN Entered By: Gretta Cool, BSN, RN, CWS, Kim on 02/22/2019 08:08:31 Brett, [name] ([id number]) -------------------------------------------------------------------------------- Encounter Discharge Information Details Patient Name: [name] Date of Service: 02/22/2019 8:00 AM Medical Record Number: [id number] Patient Account Number: [id number] Date of Birth/Sex: 1950-10-18 (68 y.o. M) Treating RN: Montey Hora Primary Care Eliette Drumwright: [name] Other Clinician: Referring Janeva Peaster: [name] Treating Helio Lack/Extender: [name], HOYT Weeks in Treatment:  32 Encounter Discharge Information Items Post Procedure Vitals Discharge Condition: Stable Temperature (F): 98.9 Ambulatory Status: Wheelchair Pulse (bpm): 80 Discharge Destination: Home Respiratory Rate (breaths/min): 16 Transportation: Private Auto Blood Pressure (mmHg): 142/80 Accompanied By: wife Schedule Follow-up Appointment: Yes Clinical Summary of Care: Electronic Signature(s) Signed: 02/22/2019 4:46:21 PM By: Montey Hora Entered By: Montey Hora on 02/22/2019 08:38:11 [name] ([id number]) -------------------------------------------------------------------------------- Lower Extremity Assessment Details Patient Name: [name] Date of Service: 02/22/2019 8:00 AM Medical Record Number: [id number] Patient Account Number: [id number] Date of Birth/Sex: [birth year] (68 y.o. M) Treating RN: [name] Primary Care Stehanie Ekstrom: [name] Other Clinician: Referring Shiraz Bastyr: [name] Treating Brianne Maina/Extender: Sharalyn Ink in Treatment: 47 Electronic Signature(s) Signed: 02/25/2019 5:41:45 PM By: Gretta Cool, BSN, RN, CWS, Kim RN, BSN Entered By: Gretta Cool, BSN, RN, CWS, Kim on 02/22/2019 08:20:42 Brett, [name] ([id number]) -------------------------------------------------------------------------------- Multi Wound Chart Details Patient Name: [name] Date of Service: 02/22/2019 8:00 AM Medical Record Number: [id number] Patient Account Number: [id number] Date of Birth/Sex: 23-Dec-1950 (68 y.o. M) Treating RN: Montey Hora Primary Care Jynesis Nakamura: [name] Other Clinician: Referring Caitrin Pendergraph: [name] Treating Alyssa Mancera/Extender: [name], HOYT Weeks in Treatment: 64 Vital Signs Height(in): 73 Pulse(bpm): 80 Weight(lbs): 210 Blood Pressure(mmHg): 142/80 Body Mass Index(BMI): 28 Temperature(F): 98.9 Respiratory Rate 16 (breaths/min): Photos: [N/A:N/A] Wound Location: Right Gluteal fold N/A  N/A Wounding Event: Pressure Injury N/A N/A Primary Etiology: Pressure Ulcer N/A N/A Comorbid History: Cataracts, Hypertension, N/A N/A Paraplegia Date Acquired: 11/02/2017 N/A N/A Weeks of Treatment: 36 N/A N/A Wound Status: Open N/A N/A Measurements L x W x D 3.5x2x3 N/A N/A (cm) Area (cm) : 5.498 N/A N/A Volume (cm) : 16.493 N/A N/A % Reduction in Area: 45.30% N/A N/A % Reduction in Volume: -1541.10% N/A N/A Position 1 (o'clock): 6 Maximum Distance 1 (cm): 3.5 Tunneling: Yes N/A N/A Classification: Category/Stage IV N/A N/A Exudate Amount: Large N/A N/A Exudate Type: Serous N/A N/A Exudate Color: amber N/A N/A Wound Margin:  Epibole N/A N/A Granulation Amount: Large (67-100%) N/A N/A Granulation Quality: Red, Pink, Hyper-granulation N/A N/A Necrotic Amount: Small (1-33%) N/A N/A Exposed Structures: Fat Layer (Subcutaneous N/A N/A Tissue) Exposed: Yes Muscle: Yes Fascia: No Brett, [name]. ([id number]) Tendon: No Joint: No Bone: No Epithelialization: None N/A N/A Treatment Notes Electronic Signature(s) Signed: 02/22/2019 4:46:21 PM By: Montey Hora Entered By: Montey Hora on 02/22/2019 08:32:27 [name] ([id number]) -------------------------------------------------------------------------------- Multi-Disciplinary Care Plan Details Patient Name: [name] Date of Service: 02/22/2019 8:00 AM Medical Record Number: [id number] Patient Account Number: [id number] Date of Birth/Sex: 01-08-1951 (68 y.o. M) Treating RN: Montey Hora Primary Care Patrena Santalucia: [name] Other Clinician: Referring Arif Amendola: [name] Treating Shakeitha Umbaugh/Extender: [name], HOYT Weeks in Treatment: 53 Active Inactive Orientation to the Wound Care Program Nursing Diagnoses: Knowledge deficit related to the wound healing center program Goals: Patient/caregiver will verbalize understanding of the [place] Program Date Initiated:  11/30/2017 Target Resolution Date: 12/21/2017 Goal Status: Active Interventions: Provide education on orientation to the wound center Notes: Pressure Nursing Diagnoses: Knowledge deficit related to management of pressures ulcers Goals: Patient/caregiver will verbalize understanding of pressure ulcer management Date Initiated: 05/17/2018 Target Resolution Date: 05/28/2018 Goal Status: Active Interventions: Assess: immobility, friction, shearing, incontinence upon admission and as needed Notes: Wound/Skin Impairment Nursing Diagnoses: Impaired tissue integrity Goals: Patient/caregiver will verbalize understanding of skin care regimen Date Initiated: 11/30/2017 Target Resolution Date: 12/21/2017 Goal Status: Active Ulcer/skin breakdown will have a volume reduction of 30% by week 4 Date Initiated: 11/30/2017 Target Resolution Date: 12/21/2017 Goal Status: Active JOSHUWA, VECCHIO ([id number]) Interventions: Assess patient/caregiver ability to obtain necessary supplies Assess patient/caregiver ability to perform ulcer/skin care regimen upon admission and as needed Assess ulceration(s) every visit Treatment Activities: Skin care regimen initiated : 11/30/2017 Notes: Electronic Signature(s) Signed: 02/22/2019 4:46:21 PM By: Montey Hora Entered By: Montey Hora on 02/22/2019 08:32:06 RIEL, HIRSCHMAN ([id number]) -------------------------------------------------------------------------------- Pain Assessment Details Patient Name: [name] Date of Service: 02/22/2019 8:00 AM Medical Record Number: [id number] Patient Account Number: [id number] Date of Birth/Sex: 05/22/1951 (68 y.o. M) Treating RN: [name] Primary Care Cheston Coury: [name] Other Clinician: Referring Shayna Eblen: [name] Treating Brunette Lavalle/Extender: [name], HOYT Weeks in Treatment: 65 Active Problems Location of Pain Severity and Description of Pain Patient Has Paino Yes Site  Locations Pain Location: Generalized Pain Rate the pain. Current Pain Level: 6 Pain Management and Medication Current Pain Management: Electronic Signature(s) Signed: 02/25/2019 5:41:45 PM By: Gretta Cool, BSN, RN, CWS, Kim RN, BSN Entered By: Gretta Cool, BSN, RN, CWS, Kim on 02/22/2019 08:08:43 [name] ([id number]) -------------------------------------------------------------------------------- Patient/Caregiver Education Details Patient Name: [name] Date of Service: 02/22/2019 8:00 AM Medical Record Number: [id number] Patient Account Number: [id number] Date of Birth/Gender: 02/08/1951 (68 y.o. M) Treating RN: Montey Hora Primary Care Physician: [name] Other Clinician: Referring Physician: [name] Treating Physician/Extender: Sharalyn Ink in Treatment: 73 Education Assessment Education Provided To: Patient and Caregiver Education Topics Provided Wound/Skin Impairment: Handouts: Other: wound care as ordered Methods: Demonstration, Explain/Verbal Responses: State content correctly Electronic Signature(s) Signed: 02/22/2019 4:46:21 PM By: Montey Hora Entered By: Montey Hora on 02/22/2019 08:37:17 [name] ([id number]) -------------------------------------------------------------------------------- Wound Assessment Details Patient Name: [name] Date of Service: 02/22/2019 8:00 AM Medical Record Number: [id number] Patient Account Number: [id number] Date of Birth/Sex: 1950/08/22 (68 y.o. M) Treating RN: [name] Primary Care Cyree Chuong: [name] Other Clinician: Referring Allee Busk: [name] Treating Jacobs Golab/Extender: [name], HOYT Weeks in Treatment: 64 Wound Status Wound  Number: 1 Primary Etiology: Pressure Ulcer Wound Location: Right Gluteal fold Wound Status: Open Wounding Event: Pressure Injury Comorbid History: Cataracts, Hypertension, Paraplegia Date Acquired: 11/02/2017 Weeks Of  Treatment: 64 Clustered Wound: No Photos Wound Measurements Length: (cm) 3.5 Width: (cm) 2 Depth: (cm) 3 Area: (cm) 5.498 Volume: (cm) 16.493 % Reduction in Area: 45.3% % Reduction in Volume: -1541.1% Epithelialization: None Tunneling: Yes Position (o'clock): 6 Maximum Distance: (cm) 3.5 Wound Description Classification: Category/Stage IV Wound Margin: Epibole Exudate Amount: Large Exudate Type: Serous Exudate Color: amber Foul Odor After Cleansing: No Slough/Fibrino Yes Wound Bed Granulation Amount: Large (67-100%) Exposed Structure Granulation Quality: Red, Pink, Hyper-granulation Fascia Exposed: No Necrotic Amount: Small (1-33%) Fat Layer (Subcutaneous Tissue) Exposed: Yes Necrotic Quality: Adherent Slough Tendon Exposed: No Muscle Exposed: Yes Necrosis of Muscle: No Joint Exposed: No Bone Exposed: No RAUNEL, DIMARTINO ([id number]) Treatment Notes Wound #1 (Right Gluteal fold) Notes zinc to peri wound, silvercel, xtrasorb and tegaderm Electronic Signature(s) Signed: 02/25/2019 5:41:45 PM By: Gretta Cool, BSN, RN, CWS, Kim RN, BSN Entered By: Gretta Cool, BSN, RN, CWS, Kim on 02/22/2019 08:20:23 RIP, HAWES ([id number]) -------------------------------------------------------------------------------- Vitals Details Patient Name: [name] Date of Service: 02/22/2019 8:00 AM Medical Record Number: [id number] Patient Account Number: [id number] Date of Birth/Sex: 1951/03/22 (68 y.o. M) Treating RN: [name] Primary Care Jaylani Mcguinn: [name] Other Clinician: Referring Tesla Keeler: [name] Treating Indiana Pechacek/Extender: [name], HOYT Weeks in Treatment: 75 Vital Signs Time Taken: 08:05 Temperature (F): 98.9 Height (in): 73 Pulse (bpm): 80 Weight (lbs): 210 Respiratory Rate (breaths/min): 16 Body Mass Index (BMI): 27.7 Blood Pressure (mmHg): 142/80 Reference Range: 80 - 120 mg / dl Electronic Signature(s) Signed: 02/25/2019 5:41:45 PM  By: Gretta Cool, BSN, RN, CWS, Kim RN, BSN Entered By: Gretta Cool, BSN, RN, CWS, Kim on 02/22/2019 08:09:09

## 2019-03-01 ENCOUNTER — Other Ambulatory Visit: Payer: Self-pay

## 2019-03-01 ENCOUNTER — Encounter: Payer: Medicare Other | Admitting: Physician Assistant

## 2019-03-01 DIAGNOSIS — L89314 Pressure ulcer of right buttock, stage 4: Secondary | ICD-10-CM | POA: Diagnosis not present

## 2019-03-01 NOTE — Progress Notes (Addendum)
[name], [name] ([id number]) Visit Report for 03/01/2019 Arrival Information Details Patient Name: [name], [name] Date of Service: 03/01/2019 8:00 AM Medical Record Number: [id number] Patient Account Number: [id number] Date of Birth/Sex: [birth year] (68 y.o. M) Treating RN: [name] Primary Care [name]: [name] Other Clinician: Referring [name]: [name] [name], [name]: 29 Visit Information History Since Last Visit Added or deleted any medications: No Patient Arrived: Wheel Chair Any new allergies or adverse reactions: No Arrival Time: 08:05 Had a fall or experienced change in No Accompanied By: wife activities of daily living that may affect Transfer Assistance: None risk of falls: Patient Identification Verified: Yes Signs or symptoms of abuse/neglect since last visito No Secondary Verification Process Completed: Yes Hospitalized since last visit: No Patient Requires Transmission-Based No Implantable device outside of the clinic excluding No Precautions: cellular tissue based products placed in the center Patient Has Alerts: Yes since last visit: Patient Alerts: NOT Has Dressing in Place as Prescribed: Yes Diabetic Pain Present Now: Yes Electronic Signature(s) Signed: 03/01/2019 4:31:56 PM By: [name] [name] Entered By: [name] on 03/01/2019 08:06:19 [name], [name] ([id number]) -------------------------------------------------------------------------------- Encounter Discharge Information Details Patient Name: [name] Date of Service: 03/01/2019 8:00 AM Medical Record Number: [id number] Patient Account Number: [id number] Date of Birth/Sex: 16-Apr-1951 (68 y.o. M) Treating RN: [name] Primary Care Reeva Davern: [name] Other Clinician: Referring Lehua Flores: [name] Treating Jerelle Virden/Extender: [name],  [name]: 14 Encounter Discharge Information Items Post Procedure Vitals Discharge Condition: Stable Temperature (F): 97.8 Ambulatory Status: Wheelchair Pulse (bpm): 87 Discharge Destination: Home Respiratory Rate (breaths/min): 16 Transportation: Private Auto Blood Pressure (mmHg): 122/68 Accompanied By: wife Schedule Follow-up Appointment: Yes Clinical Summary of Care: Electronic Signature(s) Signed: 03/01/2019 4:46:42 PM By: [name] Entered By: [name] on 03/01/2019 08:46:22 [name] ([id number]) -------------------------------------------------------------------------------- Lower Extremity Assessment Details Patient Name: [name] Date of Service: 03/01/2019 8:00 AM Medical Record Number: [id number] Patient Account Number: [id number] Date of Birth/Sex: [birth year] (68 y.o. M) Treating RN: [name] Primary Care Angel Weedon: [name] Other Clinician: Referring [name]: [name] Treating Emalyn Schou/Extender: [name], [name]: 65 Electronic Signature(s) Signed: 03/01/2019 10:49:57 AM By: [name] Entered By: [name] on 03/01/2019 08:16:54 [name], [name] ([id number]) -------------------------------------------------------------------------------- Multi Wound Chart Details Patient Name: [name] Date of Service: 03/01/2019 8:00 AM Medical Record Number: [id number] Patient Account Number: [id number] Date of Birth/Sex: 1951/01/20 (68 y.o. M) Treating RN: [name] Primary Care [name]: [name] Other Clinician: Referring [name]: [name] [name], [name]: 65 Vital Signs Height(in): 73 Pulse(bpm): 87 Weight(lbs): 210 Blood Pressure(mmHg): 122/68 Body Mass Index(BMI): 28 Temperature(F): 97.8 Respiratory Rate 16 (breaths/min): Photos: [N/A:N/A] Wound Location: Right Gluteal fold N/A  N/A Wounding Event: Pressure Injury N/A N/A Primary Etiology: Pressure Ulcer N/A N/A Comorbid History: Cataracts, Hypertension, N/A N/A Paraplegia Date Acquired: 11/02/2017 N/A N/A Weeks of [name]: 65 N/A N/A Wound Status: Open N/A N/A Measurements L x W x D 3.5x2.5x3.2 N/A N/A (cm) Area (cm) : 6.872 N/A N/A Volume (cm) : 21.991 N/A N/A % Reduction in Area: 31.60% N/A N/A % Reduction in Volume: -2088.20% N/A N/A Position 1 (o'clock): 12 Maximum Distance 1 (cm): 4 Tunneling: Yes N/A N/A Classification: Category/Stage IV N/A N/A Exudate Amount: Large N/A N/A Exudate Type: Serosanguineous N/A N/A Exudate Color: red, brown N/A N/A Wound Margin: Epibole N/A N/A Granulation Amount: Medium (34-66%) N/A N/A Granulation  Quality: Red, Pink, Hyper-granulation N/A N/A Necrotic Amount: Medium (34-66%) N/A N/A Exposed Structures: Fat Layer (Subcutaneous N/A N/A Tissue) Exposed: Yes Muscle: Yes Fascia: No [name], [name]. ([id number]) Tendon: No Joint: No Bone: No Epithelialization: None N/A N/A [name] Notes Electronic Signature(s) Signed: 03/01/2019 4:46:42 PM By: [name] Entered By: [name] on 03/01/2019 08:22:49 [name] ([id number]) -------------------------------------------------------------------------------- [place] Details Patient Name: [name] Date of Service: 03/01/2019 8:00 AM Medical Record Number: [id number] Patient Account Number: [id number] Date of Birth/Sex: 1951/05/04 (68 y.o. M) Treating RN: [name] Primary Care Kalli Greenfield: [name] Other Clinician: Referring [name]: [name] [name], [name]: 11 Active Inactive Orientation to the Wound Care Program Nursing Diagnoses: Knowledge deficit related to the wound healing center program Goals: Patient/caregiver will verbalize understanding of the [facility] Program Date  Initiated: 11/30/2017 Target Resolution Date: 12/21/2017 Goal Status: Active Interventions: Provide education on orientation to the wound center Notes: Pressure Nursing Diagnoses: Knowledge deficit related to management of pressures ulcers Goals: Patient/caregiver will verbalize understanding of pressure ulcer management Date Initiated: 05/17/2018 Target Resolution Date: 05/28/2018 Goal Status: Active Interventions: Assess: immobility, friction, shearing, incontinence upon admission and as needed Notes: Wound/Skin Impairment Nursing Diagnoses: Impaired tissue integrity Goals: Patient/caregiver will verbalize understanding of skin care regimen Date Initiated: 11/30/2017 Target Resolution Date: 12/21/2017 Goal Status: Active Ulcer/skin breakdown will have a volume reduction of 30% by week 4 Date Initiated: 11/30/2017 Target Resolution Date: 12/21/2017 Goal Status: Active [name], [name] ([id number]) Interventions: Assess patient/caregiver ability to obtain necessary supplies Assess patient/caregiver ability to perform ulcer/skin care regimen upon admission and as needed Assess ulceration(s) every visit [name] Activities: Skin care regimen initiated : 11/30/2017 Notes: Electronic Signature(s) Signed: 03/01/2019 4:46:42 PM By: [name] Entered By: [name] on 03/01/2019 08:22:39 [name], [name] ([id number]) -------------------------------------------------------------------------------- Pain Assessment Details Patient Name: [name] Date of Service: 03/01/2019 8:00 AM Medical Record Number: [id number] Patient Account Number: [id number] Date of Birth/Sex: 01-15-1951 (68 y.o. M) Treating RN: [name] Primary Care Genesee Nase: [name] Other Clinician: Referring Oretta Berkland: [name] Treating Matasha Smigelski/Extender: [name], [name]: 20 Active Problems Location of Pain Severity and Description of Pain Patient Has Paino  Yes Site Locations Rate the pain. Current Pain Level: 2 Pain Management and Medication Current Pain Management: Electronic Signature(s) Signed: 03/01/2019 4:31:56 PM By: Paulla Fore, RRT, [name] Signed: 03/01/2019 4:46:42 PM By: [name] Entered By: [name] on 03/01/2019 08:06:41 [name], [name] ([id number]) -------------------------------------------------------------------------------- Patient/Caregiver Education Details Patient Name: [name] Date of Service: 03/01/2019 8:00 AM Medical Record Number: [id number] Patient Account Number: [id number] Date of Birth/Gender: 27-Jan-1951 (68 y.o. M) Treating RN: [name] Primary Care Physician: [name] Other Clinician: Referring Physician: Tracie [name] Treating Physician/Extender: Sharalyn Ink in [name]: 34 Education Assessment Education Provided To: Patient Education Topics Provided Wound/Skin Impairment: Handouts: Caring for Your Ulcer Methods: Demonstration, Explain/Verbal Responses: State content correctly Electronic Signature(s) Signed: 03/01/2019 4:46:42 PM By: [name] Entered By: [name] on 03/01/2019 08:35:36 [name], [name] ([id number]) -------------------------------------------------------------------------------- Wound Assessment Details Patient Name: [name] Date of Service: 03/01/2019 8:00 AM Medical Record Number: [id number] Patient Account Number: [id number] Date of Birth/Sex: 1951-07-08 (68 y.o. M) Treating RN: [name] Primary Care Manjot Hinks: [name] Other Clinician: Referring Anetta Olvera: [name] Treating Danyla Wattley/Extender: [name], [name]: 65 Wound Status Wound Number: 1 Primary Etiology: Pressure Ulcer Wound Location: Right Gluteal fold Wound Status:  Open Wounding Event: Pressure Injury Comorbid History: Cataracts, Hypertension, Paraplegia Date  Acquired: 11/02/2017 Weeks Of [name]: 65 Clustered Wound: No Photos Wound Measurements Length: (cm) 3.5 Width: (cm) 2.5 Depth: (cm) 3.2 Area: (cm) 6.872 Volume: (cm) 21.991 % Reduction in Area: 31.6% % Reduction in Volume: -2088.2% Epithelialization: None Tunneling: Yes Position (o'clock): 12 Maximum Distance: (cm) 4 Wound Description Classification: Category/Stage IV Wound Margin: Epibole Exudate Amount: Large Exudate Type: Serosanguineous Exudate Color: red, brown Foul Odor After Cleansing: No Slough/Fibrino Yes Wound Bed Granulation Amount: Medium (34-66%) Exposed Structure Granulation Quality: Red, Pink, Hyper-granulation Fascia Exposed: No Necrotic Amount: Medium (34-66%) Fat Layer (Subcutaneous Tissue) Exposed: Yes Necrotic Quality: Adherent Slough Tendon Exposed: No Muscle Exposed: Yes Necrosis of Muscle: No Joint Exposed: No Bone Exposed: No [name], [name] ([id number]) [name] Notes Wound #1 (Right Gluteal fold) Notes zinc to peri wound, Hydrafera Blue, xtrasorb and tegaderm Electronic Signature(s) Signed: 03/06/2019 11:39:45 PM By: Worthy Keeler [name] Signed: 03/07/2019 10:20:59 AM By: [name] Previous Signature: 03/01/2019 10:49:57 AM Version By: [name] Entered By: Worthy Keeler on 03/06/2019 23:37:27 [name], [name] ([id number]) -------------------------------------------------------------------------------- Vitals Details Patient Name: [name] Date of Service: 03/01/2019 8:00 AM Medical Record Number: [id number] Patient Account Number: [id number] Date of Birth/Sex: 1950-10-03 (68 y.o. M) Treating RN: [name] Primary Care Massie Cogliano: [name] Other Clinician: Referring Lesli Issa: [name] Treating Sherlock Nancarrow/Extender: [name], [name]: 65 Vital Signs Time Taken: 08:05 Temperature (F): 97.8 Height (in): 73 Pulse (bpm): 87 Weight (lbs): 210 Respiratory Rate  (breaths/min): 16 Body Mass Index (BMI): 27.7 Blood Pressure (mmHg): 122/68 Reference Range: 80 - 120 mg / dl Electronic Signature(s) Signed: 03/01/2019 4:31:56 PM By: [name] [name] Entered By: Becky Sax, Amado Nash on 03/01/2019 08:08:31

## 2019-03-01 NOTE — Progress Notes (Addendum)
TAIDEN, RAYBOURN ([id number]) Visit Report for 03/01/2019 Biopsy Details Patient Name: LINVILLE, DECAROLIS Date of Service: 03/01/2019 8:00 AM Medical Record Number: [id number] Patient Account Number: [id number] Date of Birth/Sex: 1951-02-28 (68 y.o. M) Treating RN: Montey Hora Primary Care Provider: [name] Other Clinician: Referring Provider: [name] Treating Provider/Extender: [name], Lygia Olaes Weeks in Treatment: 44 Biopsy Performed for: Wound #1 Right Gluteal fold Location(s): Wound Bed Performed By: Physician [name], Sunil Hue E., [name] Tissue Punch: No Number of Specimens Taken: 1 Specimen Sent To Pathology: Yes Level of Consciousness (Pre-procedure): Awake and Alert Pre-procedure Verification/Time-Out Taken: Yes - 08:32 Instrument: Forceps, Scissors Bleeding: Large Hemostasis Achieved: Pressure Procedural Pain: 0 Post Procedural Pain: 0 Response to Treatment: Procedure was tolerated well Level of Consciousness (Post-procedure): Awake and Alert Post Procedure Diagnosis Same as Pre-procedure Electronic Signature(s) Signed: 03/01/2019 4:46:42 PM By: Montey Hora Signed: 03/02/2019 3:08:05 PM By: Worthy Keeler [name] Entered By: Montey Hora on 03/01/2019 08:34:48 IGNATZ, DEIS ([id number]) -------------------------------------------------------------------------------- Chief Complaint Document Details Patient Name: [name] Date of Service: 03/01/2019 8:00 AM Medical Record Number: [id number] Patient Account Number: [id number] Date of Birth/Sex: [birth year] (68 y.o. M) Treating RN: Montey Hora Primary Care Provider: [name] Other Clinician: Referring Provider: [name] Treating Provider/Extender: [name], Farooq Petrovich Weeks in Treatment: 25 Information Obtained from: Patient Chief Complaint Right gluteal fold ulcer Electronic Signature(s) Signed: 03/01/2019 8:05:43 AM By: Worthy Keeler [name] Entered By: Worthy Keeler on 03/01/2019 08:05:43 GUISEPPE, FLANAGAN ([id number]) -------------------------------------------------------------------------------- HPI Details Patient Name: [name] Date of Service: 03/01/2019 8:00 AM Medical Record Number: [id number] Patient Account Number: [id number] Date of Birth/Sex: Apr 16, 1951 (68 y.o. 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 He states that there is no history of diabetes that he is aware of. He does have issues with his prostate and is currently receiving treatment for this by way of oral medication. With that being said I do not have a lot of details in that regard. Nonetheless the patient presents today as a result of having been referred to [place] by another provider initially home health was set to come out and take care of his wound although due to the fact that he apparently drives he's not able to receive home health. His wife is therefore trying to help take care of this wound within although they have been struggling with what exactly to do at this point. She states that she can do some things but she is definitely not a nurse and does have some issues with looking at blood. The good news is the wound does not appear to be too deep and is fairly superficial at this point. There is no slough noted there is some nonviable skin noted around the surface of the wound and the perimeter at this point. The central portion of the wound appears to be very good with a dermal layer noted this does not appear  to be again deep enough to extend it to subcutaneous tissue at this point. Overall the patient for a paraplegic seems to be functioning fairly well he does have  both a spinal cord stimulator as well is the intrathecal pump. In the pump he has Dilaudid and baclofen. 12/07/17 on evaluation today patient presents for follow-up concerning his ongoing lower back thigh ulcer on the right. He states that he did not get the supplies ordered and therefore has not really been able to perform the dressing changes as directed exactly. His wife was able to get some Boarder Foam Dressing's from the drugstore and subsequently has been using hydrogel which did help to a degree in the wound does appear to be able smaller. There is actually more drainage this week noted than previous. 12/21/17 on evaluation today patient appears to be doing rather well in regard to his right gluteal ulcer. He has been tolerating the dressing changes without complication. There does not appear to be any evidence of infection at this point in time. Overall the wound does seem to be making some progress as far as the edges are concerned there's not as much in the way of overlapping of the external wound edges and he has a good epithelium to wound bed border for the most part. This however is not true right at the 12 o'clock location over the span of a little over a centimeters which actually will require debridement today to clean this away and hopefully allow it to continue to heal more appropriately. 12/28/17 on evaluation today patient appears to be doing rather well in regard to his ulcer in the left gluteal region. He's been tolerating the dressing changes without complication. Apparently he has had some difficulty getting his dressing material. Apparently there's been some confusion with ordering we're gonna check into this. Nonetheless overall he's been showing signs of improvement which is good news. Debridement is not required  today. 01/04/18 on evaluation today patient presents for follow-up concerning his right gluteal ulcer. He has been tolerating the dressing changes fairly well. On inspection today it appears he may actually have some maceration them concerned about the fact that he may be developing too much moisture in and around the wound bed which can cause delay in healing. With that being said he unfortunately really has not showed significant signs of improvement since last week's evaluation in fact this may even be just the little bit/slightly larger. Nonetheless he's been having a lot of discomfort I'm not sure this is even related to the wound as he has no pain when I'm to breeding or otherwise cleaning the wound during evaluation today. Nonetheless this is something that we did recommend he talked to his pain specialist concerning. 01/11/18 on evaluation today patient appears to be doing better in regard to his ulceration. He has been tolerating the dressing changes without complication. With that being said overall there's no evidence of infection which is good news. The only thing is he did receive the hatch affair blue classic versus the ready nonetheless I feel like this is perfectly fine and appears to have done well for him over the past week. 01/25/18 on evaluation today patient's wound actually appears to be a little bit larger than during the last evaluation. The good KORT, STETTLER. ([id number]) news is the majority of the wound edges actually appear to be fairly firmly attached to the wound bed unfortunately again we're not really making progress in regard to the size. Roughly the wound is about the same size as when I first saw him although again the wound margin/edges appear to be much better. 02/01/18 on evaluation today patient actually appears to  be doing very well in regard to his wound. Applying the Prisma dry does seem to be better although he does still have issues with slow progression  of the wound. There was a slight improvement compared to last week's measurements today. Nonetheless I have been considering other options as far as the possibility of Theraskin or even a snap vac. In general I'm not sure that the Theraskin due to location of the wound would be a very good idea. Nonetheless I do think that a snap vac could be a possibility for the patient and in fact I think this could even be an excellent way to manage the wound possibly seeing some improvement in a very rapid fashion here. Nonetheless this is something that we would need to get approved and I did have a lengthy conversation with the patient about this today. 02/08/18 on evaluation today patient appears to be doing a little better in regard to his ulcer. He has been tolerating the dressing changes without complication. Fortunately despite the fact that the wound is a little bit smaller it's not significantly so unfortunately. We have discussed the possibility of a snap vac we did check with insurance this is actually covered at this point. Fortunately there does not appear to be any sign of infection. Overall I'm fairly pleased with how things seem to be appearing at this point. 02/15/18 on evaluation today patient appears to be doing rather well in regard to his right gluteal ulcer. Unfortunately the snap vac did not stay in place with his sheer and friction this came loose and did not seem to maintain seal very well. He worked for about two days and it did seem to do very well during that time according to his wife but in general this does not seem to be something that's gonna be beneficial for him long-term. I do believe we need to go back to standard dressings to see if we can find something that will be of benefit. 03/02/18- He is here in follow up evaluation; there is minimal change in the wound. He will continue with the same treatment plan, would consider changing to iodosrob/iodoflex if ulcer continues to to  plateau. He will follow up next week 03/08/18 on evaluation today patient's wound actually appears to be about the same size as when I previously saw him several weeks back. Unfortunately he does have some slightly dark discoloration in the central portion of the wound which has me concerned about pressure injury. I do believe he may be sitting for too long a period of time in fact he tells me that "I probably sit for much too long". He does have some Slough noted on the surface of the wound and again as far as the size of the wound is concerned I'm really not seeing anything that seems to have improved significantly. 03/15/18 on evaluation today patient appears to be doing fairly well in regard to his ulcer. The wound measured pretty much about the same today compared to last week's evaluation when looking at his graph. With that being said the area of bruising/deep tissue injury that was noted last week I do not see at this point. He did get a new cushion fortunately this does seem to be have been of benefit in my pinion. It does appear that he's been off of this more which is good news as well I think that is definitely showing in the overall wound measurements. With that being said I do believe that he  needs to continue to offload I don't think that the fact this is doing better should be or is going to allow him to not have to offload and explain this to him as well. Overall he seems to be in agreement the plan I think he understands. The overall appearance of the wound bed is improved compared to last week I think the Iodoflex has been beneficial in that regard. 03/29/18 on evaluation today patient actually appears to be doing rather well in regard to his wound from the overall appearance standpoint he does have some granulation although there's some Slough on the surface of the wound noted as well. With that being said he unfortunately has not improved in regard to the overall measurement of the  wound in volume or in size. I did have a discussion with him very specifically about offloading today. He actually does work although he mainly is just sitting throughout the day. He tells me he offloads by "lifting himself up for 30 seconds off of his chair occasionally" purchase from [employer] which does seem to have helped. And he has a new cushion that he with that being said he's also able to stand some for a very short period of time but not significant enough I think to provide appropriate offloading. I think the biggest issue at this point with the wound and the fact is not healing as quickly as we would like is due to the fact that he is really not able to appropriately offload while at work. He states the beginning after his injury he actually had a bed at his job that he could lay on in order to offload and that does seem to have been of help back at that time. Nonetheless he had not done this in quite some time unfortunately. I think that could be helpful for him this is something I would like for him to look into. 04/05/18 on evaluation today patient actually presents for follow-up concerning his right gluteal ulcer. Again he really is not significantly improved even compared to last week. He has been tolerating the dressing changes without complication. With that being said fortunately there appears to be no evidence of infection at this time. He has been more proactive in trying to offload. BENGIE, KAUCHER ([id number]) 04/12/18 on evaluation today patient actually appears to be doing a little better in regard to his wound and the right gluteal fold region. He's been tolerating the dressing changes since removing the oasis without complication. However he was having a lot of burning initially with the oasis in place. He's unsure of exactly why this was given so much discomfort but he assumes that it was the oasis itself causing the problem. Nonetheless this had to be removed  after about three days in place although even those three days seem to have made a fairly good improvement in regard to the overall appearance of the wound bed. In fact is the first time that he's made any improvement from the standpoint of measurements in about six weeks. He continues to have no discomfort over the area of the wound itself which leads me to wonder why he was having the burning with the oasis when he does not even feel the actual debridement's themselves. I am somewhat perplexed by this. 04/19/18 on evaluation today patient's wound actually appears to be showing signs of epithelialization around the edge of the wound and in general actually appears to be doing better which is good news. He did  have the same burning after about three days with applying the Endoform last week in the same fashion that I would generally apply a skin substitute. This seems to indicate that it's not the oasis to cause the problem but potentially the moisture buildup that just causes things to burn or there may be some other reaction with the skin prep or Steri-Strips. Nonetheless I'm not sure that is gonna be able to tolerate any skin substitute for a long period of time. The good news is the wound actually appears to be doing better today compared to last week and does seem to finally be making some progress. 04/26/18 on evaluation today patient actually appears to be doing rather well in regard to his ulcer in the right gluteal fold. He has been tolerating the dressing changes without complication which is good news. The Endoform does seem to be helping although he was a little bit more macerated this week. This seems to be an ongoing issue with fluid control at this point. Nonetheless I think we may be able to add something like Drawtex to help control the drainage. 05/03/18 on evaluation today patient appears to actually be doing better in regard to the overall appearance of his wound. He has been  tolerating the dressing changes without complication. Fortunately there appears to be no evidence of infection at this time. I really feel like his wound has shown signs as of today of turning around last week I thought so as well and definitely he could be seen in this week's overall appearance and measurements. In general I'm very pleased with the fact that he finally seems to be making a steady but sure progress. The patient likewise is very pleased. 05/17/18 on evaluation today patient appears to be doing more poorly unfortunately in regard to his ulcer. He has been tolerating the dressing changes without complication. With that being said he tells me that in the past couple of days he and his wife have noticed that we did not seem to be doing quite as well is getting dark near the center. Subsequently upon evaluation today the wound actually does appear to be doing worse compared to previous. He has been tolerating the dressing changes otherwise and he states that he is not been sitting up anymore than he was in the past from what he tells me. Still he has continued to work he states "I'm tired of dealing with this and if I have to just go home and lay in the bed all the time that's what I'll do". Nonetheless I am concerned about the fact that this wound does appear to be deeper than what it was previous. 05/24/18 upon evaluation today patient actually presents after having been in the hospital due to what was presumed to be sepsis secondary to the wound infection. He had an elevated white blood cell count between 14 and 15. With that being said he does seem to be doing somewhat better now. His wound still is giving him some trouble nonetheless and he is obviously concerned about the fact likely talked about that this does seem to go more deeply than previously noted. I did review his wound culture which showed evidence of Staphylococcus aureus him and group B strep. Nonetheless he is on  antibiotics, Levaquin, for this. Subsequently I did review his intake summary from the hospital as well. I also did look at the CT of the lumbar spine with contrast that was performed which showed no bone destruction to suggest  lumbar disguises/osteomyelitis or sacral osteomyelitis. There was no paraspinal abscess. Nonetheless it appears this may have been more of just a soft tissue infection at this point which is good news. He still is nonetheless concerned about the wound which again I think is completely reasonable considering everything he's been through recently. 05/31/18 on evaluation today on evaluation today patient actually appears to be showing signs of his wound be a little bit deeper than what I would like to see. Fortunately he does not show any signs of significant infection although his temperature was 99 today he states he's been checking this at home and has not been elevated. Nonetheless with the undermining that I'm seeing at this point I am becoming more concerned about the wound I do think that offloading is a key factor here that is preventing the speedy recovery at this point. There does not appear to be any evidence of again over infection noted. He's been using Santyl currently. 06/07/18 the patient presents today for follow-up evaluation regarding the left ulcer in the gluteal region. He has been tolerating the Wound VAC fairly well. He is obviously very frustrated with this he states that to mean is really getting in his way. There does not appear to be any evidence of infection at this time he does have a little bit of odor I do not necessarily associate this with infection just something that we sometimes notice with Wound VAC therapy. With that being said I can definitely catch a tone of discontentment overall in the patient's demeanor today. This when he was previously in the hospital LENFORD, BEDDOW. ([id number]) an CT scan was done of the lumbar region which did  not reveal any signs of osteomyelitis. With that being said the pelvis in particular was not evaluated distinctly which means he could still have some osteonecrosis I. Nonetheless the Wound VAC was started on Thursday I do want to get this little bit more time before jumping to a CT scan of the pelvis although that is something that I might would recommend if were not see an improvement by that time. 06/14/18 on evaluation today patient actually appears to be doing about the same in regard to his right gluteal ulcer. Again he did have a CT scan of the lumbar spine unfortunately this did not include the pelvis. Nonetheless with the depth of the wound that I'm seeing today even despite the fact that I'm not seeing any evidence of overt cellulitis I believe there's a good chance that we may be dealing with osteomyelitis somewhere in the right Ischial region. No fevers, chills, nausea, or vomiting noted at this time. 06/21/18 on evaluation today patient actually appears to be doing about the same with regard to his wound. The tunnel at 6 o'clock really does not appear to be any deeper although it is a little bit wider. I think at this point you may want to start packing this with white phone. Unfortunately I have not got approval for the CT scan of the pelvis as of yet due to the fact that Medicare apparently has been denied it due to the diagnosis codes not being appropriate according to Medicare for the test requested. With that being said the patient cannot have an MRI and therefore this is the only option that we have as far as testing is concerned. The patient has had infection and was on antibiotics and been added code for cellulitis of the bottom to see if this will be appropriate for getting  the test approved. Nonetheless I'm concerned about the infection have been spread deeper into the Ischial region. 06/28/18 on evaluation today patient actually appears to be doing rather well all things  considered in regard to the right gluteal ulcer. He has been tolerating the dressing changes without complication. With that being said the Wound VAC he states does have to be replaced almost every day or at least reinforced unfortunately. Patient actually has his CT scan later this morning we should have the results by tomorrow. 07/05/18 on evaluation today patient presents for follow-up concerning his right Ischial ulcer. He did see the surgeon Dr. Lysle Pearl last week. They were actually very happy with him and felt like he spent a tremendous amount of time with them as far as discussing his situation was concerned. In the end Dr. Lysle Pearl did contact me as well and determine that he would not recommend any surgical intervention at this point as he felt like it would not be in the patient's best interest based on what he was seeing. He recommended a referral to infectious disease. Subsequently this is something that Dr. Ines Bloomer office is working on setting up for the patient. As far as evaluation today is concerned the patient's wound actually appears to be worse at this point. I am concerned about how things are progressing and specifically about infection. I do not feel like it's the deeper but the area of depth is definitely widening which does have me concerned. No fevers, chills, nausea, or vomiting noted at this time. I think that we do need initiate antibiotic therapy the patient has an allow allergy to amoxicillin/penicillin he states that he gets a rash since childhood. Nonetheless she's never had the issues with Catholics or cephalosporins in general but he is aware of. 07/27/18 on evaluation today patient presents following admission to the hospital on 07/09/18. He was subsequently discharged on 07/20/18. On 07/15/18 the patient underwent irrigation and debridement was soft tissue biopsy and bone biopsy as well as placement of a Wound VAC in the OR by Dr. Celine Ahr. During the hospital course the  patient was placed on a Wound VAC and recommended follow up with surgery in three weeks actually with Dr. Delaine Lame who is infectious disease. The patient was on vancomycin during the hospital course. 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 He also had a bone culture which revealed evidence of methicillin-resistant staph aureus. He is updated CT scan 07/09/18 reveals that he had progression of the which was performed on wound to breakdown down to the trochanter where he actually had irregularities there as well suggestive of osteomyelitis. This was a change just since 9 December when we last performed a CT scan. Obviously this one had gone downhill quite significantly and rapidly. At this point upon evaluation I feel like in general the patient's wound seems to be doing fairly well all things considered upon my evaluation today. Obviously this is larger and deeper than what I previously evaluated but at the same time he seems to be making some progress as far as the appearance of the granulation tissue is concerned. I'm happy in that regard. No fevers, chills, nausea, or vomiting noted at this time. He is on IV vancomycin and Rocephin at the facility. He is currently in [facility]. 08/03/18 upon evaluation today patient's wound appears to be doing better in regard to the overall appearance at this point in time. Fortunately he's been tolerating the Wound VAC without complication and  states that the facility has been taking excellent care of the wound site. Overall I see some Slough noted on the surface which I am going to attempt sharp debridement today of but nonetheless other than this I feel like he's making progress. 08/09/18 on evaluation today patient's wound appears to be doing much better compared to even last week's evaluation. Do believe that the Wound VAC is been of great benefit for him. He has been tolerating the dressing changes that is the  Wound ARRIE, ZUERCHER. ([id number]) VAC without any complication and he has excellent granulation noted currently. There is no need for sharp debridement at this point. 08/16/18 on evaluation today patient actually appears to be doing very well in regard to the wound in the right gluteal fold region. This is showing signs of progress and again appears to be very healthy which is excellent news. Fortunately there is no sign of active infection by way of odor or drainage at this point. Overall I'm very pleased with how things stand. He seems to be tolerating the Wound VAC without complication. 08/23/18 on evaluation today patient actually appears to be doing better in regard to his wound. He has been tolerating the Wound VAC without complication and in fact it has been collecting a significant amount of drainage which I think is good news especially considering how the wound appears. Fortunately there is no signs of infection at this time definitely nothing appears to be worse which is good news. He has not been started on the Bactrim and Flagyl that was recommended by Dr. Delaine Lame yet. I did actually contact her office this morning in order to check and see were things are that regard their gonna be calling me back. 08/30/18 on evaluation today patient actually appears to show signs of excellent improvement today compared to last evaluation. The undermining is getting much better the wound seems to be feeling quite nicely and I'm very pleased that the granulation in general. With that being said overall I feel like the patient has made excellent progress which is great news. No fevers, chills, nausea, or vomiting noted at this time. 09/06/18 on evaluation today patient actually appears to be doing rather well in regard to his right gluteal ulcer. This is showing signs of improvement in overall I'm very pleased with how things seem to be progressing. The patient likewise is please. Overall I see  no evidence of infection he is about to complete his oral antibiotic regimen which is the end of the antibiotics for him in just about three days. 09/13/18 on evaluation today patient's right Ischial ulcer appears to be showing signs of continued improvement which is excellent news. He's been tolerating the dressing changes without complication. Fortunately there's no signs of infection and the wound that seems to be doing very well. 09/28/18 on evaluation today patient appears to be doing rather well in regard to his right Ischial ulcer. He's been tolerating the Wound VAC without complication he knows there's much less drainage than there used to be this obviously is not a bad thing in my pinion. There's no evidence of infection despite the fact is but nothing about it now for several weeks. 10/04/18 on evaluation today patient appears to be doing better in regard to his right Ischial wound. He has been tolerating the Wound VAC without complication and I do believe that the silver nitrate last week was beneficial for him. Fortunately overall there's no evidence of active infection at this time which is  great news. No fevers, chills, nausea, or vomiting noted at this time. 10/11/18 on evaluation today patient actually appears to be doing rather well in regard to his Ischial ulcer. He's been tolerating the Wound VAC still without complication I feel like this is doing a good job. No fevers, chills, nausea, or vomiting noted at this time. 11/01/18 on evaluation today patient presents after having not been seen in our clinic for several weeks secondary to the fact that he was on evaluation today patient presents after having not been seen in our clinic for several weeks secondary to the fact that he was in a skilled nursing facility which was on lockdown currently due to the covert 19 national emergency. Subsequently he was discharged from the facility on this past Friday and subsequently made an  appointment to come in to see yesterday. Fortunately there's no signs of active infection at this time which is good news and overall he does seem to have made progress since I last saw. Overall I feel like things are progressing quite nicely. The patient is having no pain. 11/08/18 on evaluation today patient appears to be doing okay in regard to his right gluteal ulcer. He has been utilizing a Wound VAC home health this changing this at this point since he's home from the skilled nursing facility. Fortunately there's no signs of obvious active infection at this time. Unfortunately though there's no obvious active infection he is having some maceration and his wife states that when the sheets of the Wound VAC office on Sunday when it broke seal that he ended up having significant issues with some smell as well there concerned about the possibility of infection. Fortunately there's No fevers, chills, nausea, or vomiting noted at this time. 11/15/18 on evaluation today patient actually appears to be doing well in regard to his right gluteal ulcer. He has been tolerating the dressing changes without complication. Specifically the Wound VAC has been utilized up to this point. Fortunately there's no signs of infection and overall I feel like he has made progress even since last week when I last saw him. I'm actually fairly happy with the overall appearance although he does seem to have somewhat of a hyper granular Brix, Montavis J. ([id number]) overgrowth in the central portion of the wound which I think may require some sharp debridement to try flatness out possibly utilizing chemical cauterization following. 11/23/18 on evaluation today patient actually appears to be doing very well in regard to his sacral ulcer. He seems to be showing signs of improvement with good granulation. With that being said he still has the small area of hyper granulation right in the central portion of the wound which I'm  gonna likely utilize silver nitrate on today. Subsequently he also keeps having a leak at the 6 o'clock location which is unfortunate we may be able to help out with some suggestions to try to prevent this going forward. Fortunately there's no signs of active infection at this time. 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 He's been tolerating the dressing changes without complication. Fortunately there's no signs of active infection at this time. I've been rather pleased with how things have progressed there still some evidence of pressure getting to the area with some redness right around the immediate wound opening. Nonetheless other than this I'm not seeing any significant complications or issues the wound is somewhat hyper granular.  Upon discussing with the patient and his wife today I'm not sure that the wound is being packed to the base with the foam at this point. And if it's not been packed fully that may be part of the reason why is not seen as much improvement as far as the granulation from the base out. Again we do not want pack too tightly but we need some of the firm to get to the base of the wound. I discussed this with patient and his wife today. 12/06/18 on evaluation today patient appears to be doing well in regard to his right gluteal pressure ulcer. He's been tolerating the dressing changes without complication. Fortunately there's no signs of active infection. He still has some hyper granular tissue and I do think it would be appropriate to continue with the chemical cauterization as of today. 12/16/18 on evaluation today patient actually appears to be doing okay in regard to his right gluteal ulcer. He is been tolerating the dressing changes without complication including the Wound VAC. Overall I feel like nothing seems to be worsening I do feel like that the hyper granulation buds  in the central portion of the wound have improved to some degree with the silver nitrate. We will have to see how things continue to progress. 12/20/18 on evaluation today patient actually appears to be doing much worse in my pinion even compared to last week's evaluation. Unfortunately as opposed to showing any signs of improvement the areas of hyper granular tissue in the central portion of the wound seem to be getting worse. Subsequently the wound bed itself also seems to be getting deeper even compared to last week which is both unusual as well as concerning since prior he had been shown signs of improvement. Nonetheless I think that the issue could be that he's actually having some difficulty in issues with a deeper infection. There's no external signs of infection but nonetheless I am more worried about the internal, osteomyelitis, that could be restarting. He has not been on antibiotics for some time at this point. I think that it may be a good idea to go ahead and started back on an antibiotic therapy while we wait to see what the testing shows. 12/27/18 on evaluation today patient presents for follow-up concerning his left gluteal fold wound. Fortunately he appears to be doing well today. I did review the CT scan which was negative for any signs of osteomyelitis or acute abnormality this is excellent news. Overall I feel like the surface of the wound bed appears to be doing significantly better today compared to previously noted findings. There does not appear any signs of infection nor does he have any pain at this time. 01/03/19 on evaluation today patient actually appears to be doing quite well in regard to his ulcer. Post debridement last week he really did not have too much bleeding which is good news. Fortunately today this seems to be doing some better but we still has some of the hyper granular tissue noted in the base of the wound which is gonna require sharp debridement today as well.  Overall I'm pleased with how things seem to be progressing since we switched away from the Wound VAC I think he is making some progress. 01/10/19 on evaluation today patient appears to be doing better in regard to his right gluteal fold ulcer. He has been tolerating the dressing changes without complication. The debridement to seem to be helping with current away some of the poor  hyper granular tissue bugs throughout the region of his gluteal fold wound. He's been tolerating the dressing changes otherwise without complication which is great news. No fevers, chills, nausea, or vomiting noted at this time. 01/17/19 on evaluation today patient actually appears to be doing excellent in regard to his wound. He's been tolerating the dressing changes without complication. Fortunately there is no signs of active infection at this time which is great news. No fevers, chills, nausea, or vomiting noted at this time. 01/24/19 on evaluation today patient actually appears to be doing quite well with regard to his ulcer. He has been tolerating the dressing changes without complication. Fortunately there's no signs of active infection at this time. Overall been very pleased with the progress that he seems to be making currently. 01/31/19-Patient returns at 1 week with apparent similarity in dimensions to the wound, with no signs of infection, he has been KRAIG, GENIS. ([id number]) changing dressings twice a day 02/08/19 upon evaluation today patient actually appears to be doing well with regard to his right Ischial ulcer. The wound is not appear to be quite as deep and seems to be making progress which is good news. With that being said I'm still reluctant to go back to the Wound VAC at this point. He's been having to change the dressings twice a day which is a little bit much in my pinion from the wound care supplies standpoint. I think that possibly attempting to utilize extras orbit may be beneficial this may  also help to prevent any additional breakdown secondary to fluid retention in the wound itself. The patient is in agreement with giving this a try. 02/15/19 on evaluation today patient actually appears to be doing decently well with regard to his ulcer in the right to gluteal fold location. He's been tolerating the dressing changes without complication. Fortunately there is no signs of active infection at this time. He is able to keep the current dressing in place more effectively for a day at a time whereas before he was having a changes to to three times a day. The actions or has been helpful in this regard. Fortunately there's no signs of anything getting worse and I do feel like he showing signs of good improvement with regard to the wound bed status. 02/22/2019 patient appears to be doing very well today with regard to his ulcer in the gluteal fold. Fortunately there is no signs of active infection and he has been tolerating the dressing changes without any complication. Overall extremely pleased with how things seem to be progressing. He has much less of the hyper granular projections within the wound these have slowly been debrided away and he seems to be doing well. The wound bed is more uniform. 03/01/19 on evaluation today patient appears to be doing unfortunately about the same in regard to his gluteal ulcer. He's been tolerating the dressing changes without complication. Fortunately there's no signs of active infection at this time. With that being said he continues to develop these hyper granular projections which I'm unsure of exactly what they are and why they are rising. Nonetheless I explained to the patient that I do believe it would be a good idea for [place] to stand a biopsy sample for pathology to see if that can shed any light on what exactly may be going on here. Fortunately I do not see any obvious signs of infection. With that being said the patient has had a little bit more drainage  this week  apparently compared to last week. Electronic Signature(s) Signed: 03/02/2019 3:08:05 PM By: Worthy Keeler [name] Entered By: Worthy Keeler on 03/01/2019 22:48:12 MICHAE, GRIMLEY ([id number]) -------------------------------------------------------------------------------- Physical Exam Details Patient Name: [name] Date of Service: 03/01/2019 8:00 AM Medical Record Number: [id number] Patient Account Number: [id number] Date of Birth/Sex: September 03, 1950 (68 y.o. M) Treating RN: Montey Hora Primary Care Provider: [name] Other Clinician: Referring Provider: [name] Treating Provider/Extender: [name], Jeffrie Lofstrom Weeks in Treatment: 74 Constitutional Well-nourished and well-hydrated in no acute distress. Respiratory normal breathing without difficulty. Psychiatric this patient is able to make decisions and demonstrates good insight into disease process. Alert and Oriented x 3. pleasant and cooperative. Notes Patient's wound bed currently showed signs of good granulation at this time. Fortunately there is not appear to be any signs of active infection. The one issue that I see is that he continues to develop these hyper granular nodules or projections and again I'm unsure of exactly why these are occurring. Fortunately there is no sign of systemic infection at this time which is good news. No debridement was performed today though I did obtain a biopsy sample to sin for pathology. Electronic Signature(s) Signed: 03/02/2019 3:08:05 PM By: Worthy Keeler [name] Entered By: Worthy Keeler on 03/01/2019 22:48:56 RISHIK, TUBBY ([id number]) -------------------------------------------------------------------------------- Physician Orders Details Patient Name: [name] Date of Service: 03/01/2019 8:00 AM Medical Record Number: [id number] Patient Account Number: [id number] Date of Birth/Sex: 07/11/1951 (68 y.o. 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 Skin Barriers/Peri-Wound Care Wound #1 Right Gluteal fold o Other: - Apply zinc oxide to peri-wound area. Primary Wound Dressing Wound #1 Right Gluteal fold o Hydrafera Blue Ready Transfer Secondary Dressing Wound #1 Right Gluteal fold o XtraSorb - Equivalent product may be used but please order a size that is appropriate for the wound size and location. Secure with Tegaderm Dressing Change Frequency Wound #1 Right Gluteal fold o Change dressing every day. Follow-up Appointments Wound #1 Right Gluteal fold o Return Appointment in 1 week. Off-Loading Wound #1 Right Gluteal fold o Turn and reposition every 2 hours LESTON, SCHUELLER ([id number]) Laboratory o Tissue Pathology biopsy report (PATH) oooo LOINC Code: 02409-7 oooo Convenience Name: [facility] Path Bx report Electronic Signature(s) Signed: 03/01/2019 4:46:42 PM By: Montey Hora Signed: 03/02/2019 3:08:05 PM By: Worthy Keeler [name] Entered By: Montey Hora on 03/01/2019 08:41:00 DELNO, BLAISDELL ([id number]) -------------------------------------------------------------------------------- Problem List Details Patient Name: [name] Date of Service: 03/01/2019 8:00 AM Medical  Record Number: [id number] Patient Account Number: [id number] Date of Birth/Sex: [birth year] (68 y.o. M) Treating RN: Montey Hora Primary Care Provider: [name] Other Clinician: Referring Provider: [name] Treating Provider/Extender: [name], Meshilem Machuca Weeks in Treatment: 31 Active Problems ICD-10 Evaluated Encounter Code Description Active Date Today Diagnosis L89.314 Pressure ulcer of right buttock, stage 4 11/30/2017 No Yes L03.317 Cellulitis of buttock 06/21/2018 No Yes G82.20 Paraplegia, unspecified  11/30/2017 No Yes S34.109S Unspecified injury to unspecified level of lumbar spinal cord, 11/30/2017 No Yes sequela I10 Essential (primary) hypertension 11/30/2017 No Yes Inactive Problems Resolved Problems Electronic Signature(s) Signed: 03/01/2019 8:05:35 AM By: Worthy Keeler [name] Entered By: Worthy Keeler on 03/01/2019 08:05:35 SKYLER, CAREL ([id number]) -------------------------------------------------------------------------------- Progress Note Details Patient Name: [name] Date of Service: 03/01/2019 8:00 AM Medical Record Number: [id number] Patient Account Number: [id number] Date of Birth/Sex: 1951-04-27 (68 y.o. 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 He states that there is no history of diabetes that he is aware of. He does have issues with his prostate and is currently receiving treatment for this by way of oral  medication. With that being said I do not have a lot of details in that regard. Nonetheless the patient presents today as a result of having been referred to [place] by another provider initially home health was set to come out and take care of his wound although due to the fact that he apparently drives he's not able to receive home health. His wife is therefore trying to help take care of this wound within although they have been struggling with what exactly to do at this point. She states that she can do some things but she is definitely not a nurse and does have some issues with looking at blood. The good news is the wound does not appear to be too deep and is fairly superficial at this point. There is no slough noted there is some nonviable skin noted around the surface of the wound and the perimeter at this point. The central portion of the wound appears to be very good with a dermal layer noted this does not appear to be again deep enough to extend it to subcutaneous tissue at this point. 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 In the pump he has Dilaudid and baclofen. 12/07/17 on evaluation today patient presents for follow-up concerning his ongoing lower back thigh ulcer on the right. He states that he did not get the supplies ordered and therefore has not really been able to perform the dressing changes as directed exactly. His wife was able to get some Boarder Foam Dressing's from the drugstore and subsequently has been using hydrogel which did help to a degree in the wound does appear to be able smaller. There is actually more drainage this week noted than previous. 12/21/17 on evaluation today patient appears to be doing rather well in regard to his right gluteal ulcer. He has been tolerating the dressing changes without complication. There does not appear to be any evidence of infection  at this point in time. Overall the wound does seem to be making some progress as far as the edges are concerned there's not as much in the way of overlapping of the external wound edges and he has a good epithelium to wound bed border for the most part. This however  is not true right at the 12 o'clock location over the span of a little over a centimeters which actually will require debridement today to clean this away and hopefully allow it to continue to heal more appropriately. 12/28/17 on evaluation today patient appears to be doing rather well in regard to his ulcer in the left gluteal region. He's been tolerating the dressing changes without complication. Apparently he has had some difficulty getting his dressing material. Apparently there's been some confusion with ordering we're gonna check into this. Nonetheless overall he's been showing signs of improvement which is good news. Debridement is not required today. 01/04/18 on evaluation today patient presents for follow-up concerning his right gluteal ulcer. He has been tolerating the dressing changes fairly well. On inspection today it appears he may actually have some maceration them concerned about the fact that he may be developing too much moisture in and around the wound bed which can cause delay in healing. With that being said he unfortunately really has not showed significant signs of improvement since last week's evaluation in fact this may even be just the little bit/slightly larger. Nonetheless he's been having a lot of discomfort I'm not sure this is even related to the wound as he has no pain when I'm to breeding or otherwise cleaning the wound during evaluation today. Nonetheless this is something that we did recommend he talked to his pain specialist concerning. 01/11/18 on evaluation today patient appears to be doing better in regard to his ulceration. He has been tolerating the dressing Brick, Rowen J. ([id number]) changes  without complication. With that being said overall there's no evidence of infection which is good news. The only thing is he did receive the hatch affair blue classic versus the ready nonetheless I feel like this is perfectly fine and appears to have done well for him over the past week. 01/25/18 on evaluation today patient's wound actually appears to be a little bit larger than during the last evaluation. The good news is the majority of the wound edges actually appear to be fairly firmly attached to the wound bed unfortunately again we're not really making progress in regard to the size. Roughly the wound is about the same size as when I first saw him although again the wound margin/edges appear to be much better. 02/01/18 on evaluation today patient actually appears to be doing very well in regard to his wound. Applying the Prisma dry does seem to be better although he does still have issues with slow progression of the wound. There was a slight improvement compared to last week's measurements today. Nonetheless I have been considering other options as far as the possibility of Theraskin or even a snap vac. In general I'm not sure that the Theraskin due to location of the wound would be a very good idea. Nonetheless I do think that a snap vac could be a possibility for the patient and in fact I think this could even be an excellent way to manage the wound possibly seeing some improvement in a very rapid fashion here. Nonetheless this is something that we would need to get approved and I did have a lengthy conversation with the patient about this today. 02/08/18 on evaluation today patient appears to be doing a little better in regard to his ulcer. He has been tolerating the dressing changes without complication. Fortunately despite the fact that the wound is a little bit smaller it's not significantly so unfortunately. We have discussed the possibility of a  snap vac we did check with insurance this is  actually covered at this point. Fortunately there does not appear to be any sign of infection. Overall I'm fairly pleased with how things seem to be appearing at this point. 02/15/18 on evaluation today patient appears to be doing rather well in regard to his right gluteal ulcer. Unfortunately the snap vac did not stay in place with his sheer and friction this came loose and did not seem to maintain seal very well. He worked for about two days and it did seem to do very well during that time according to his wife but in general this does not seem to be something that's gonna be beneficial for him long-term. I do believe we need to go back to standard dressings to see if we can find something that will be of benefit. 03/02/18- He is here in follow up evaluation; there is minimal change in the wound. He will continue with the same treatment plan, would consider changing to iodosrob/iodoflex if ulcer continues to to plateau. He will follow up next week 03/08/18 on evaluation today patient's wound actually appears to be about the same size as when I previously saw him several weeks back. Unfortunately he does have some slightly dark discoloration in the central portion of the wound which has me concerned about pressure injury. I do believe he may be sitting for too long a period of time in fact he tells me that "I probably sit for much too long". He does have some Slough noted on the surface of the wound and again as far as the size of the wound is concerned I'm really not seeing anything that seems to have improved significantly. 03/15/18 on evaluation today patient appears to be doing fairly well in regard to his ulcer. The wound measured pretty much about the same today compared to last week's evaluation when looking at his graph. With that being said the area of bruising/deep tissue injury that was noted last week I do not see at this point. He did get a new cushion fortunately this does seem to be  have been of benefit in my pinion. It does appear that he's been off of this more which is good news as well I think that is definitely showing in the overall wound measurements. With that being said I do believe that he needs to continue to offload I don't think that the fact this is doing better should be or is going to allow him to not have to offload and explain this to him as well. Overall he seems to be in agreement the plan I think he understands. The overall appearance of the wound bed is improved compared to last week I think the Iodoflex has been beneficial in that regard. 03/29/18 on evaluation today patient actually appears to be doing rather well in regard to his wound from the overall appearance standpoint he does have some granulation although there's some Slough on the surface of the wound noted as well. With that being said he unfortunately has not improved in regard to the overall measurement of the wound in volume or in size. I did have a discussion with him very specifically about offloading today. He actually does work although he mainly is just sitting throughout the day. He tells me he offloads by "lifting himself up for 30 seconds off of his chair occasionally" purchase from [employer] which does seem to have helped. And he has a new cushion that he with  that being said he's also able to stand some for a very short period of time but not significant enough I think to provide appropriate offloading. I think the biggest issue at this point with the wound and the fact is not healing as quickly as we would like is due to the fact that he is really not able to appropriately offload while at work. He states the beginning after his injury he actually had a bed at his job that he could lay on in order to offload and that does seem to have been of help back at that time. Nonetheless he had not done this in quite some time unfortunately. I think that could be helpful for him this is  something I would like for him to look into. DEVESH, MONFORTE ([id number]) 04/05/18 on evaluation today patient actually presents for follow-up concerning his right gluteal ulcer. Again he really is not significantly improved even compared to last week. He has been tolerating the dressing changes without complication. With that being said fortunately there appears to be no evidence of infection at this time. He has been more proactive in trying to offload. 04/12/18 on evaluation today patient actually appears to be doing a little better in regard to his wound and the right gluteal fold region. He's been tolerating the dressing changes since removing the oasis without complication. However he was having a lot of burning initially with the oasis in place. He's unsure of exactly why this was given so much discomfort but he assumes that it was the oasis itself causing the problem. Nonetheless this had to be removed after about three days in place although even those three days seem to have made a fairly good improvement in regard to the overall appearance of the wound bed. In fact is the first time that he's made any improvement from the standpoint of measurements in about six weeks. He continues to have no discomfort over the area of the wound itself which leads me to wonder why he was having the burning with the oasis when he does not even feel the actual debridement's themselves. I am somewhat perplexed by this. 04/19/18 on evaluation today patient's wound actually appears to be showing signs of epithelialization around the edge of the wound and in general actually appears to be doing better which is good news. He did have the same burning after about three days with applying the Endoform last week in the same fashion that I would generally apply a skin substitute. This seems to indicate that it's not the oasis to cause the problem but potentially the moisture buildup that just causes things to burn  or there may be some other reaction with the skin prep or Steri-Strips. Nonetheless I'm not sure that is gonna be able to tolerate any skin substitute for a long period of time. The good news is the wound actually appears to be doing better today compared to last week and does seem to finally be making some progress. 04/26/18 on evaluation today patient actually appears to be doing rather well in regard to his ulcer in the right gluteal fold. He has been tolerating the dressing changes without complication which is good news. The Endoform does seem to be helping although he was a little bit more macerated this week. This seems to be an ongoing issue with fluid control at this point. Nonetheless I think we may be able to add something like Drawtex to help control the drainage. 05/03/18 on evaluation today  patient appears to actually be doing better in regard to the overall appearance of his wound. He has been tolerating the dressing changes without complication. Fortunately there appears to be no evidence of infection at this time. I really feel like his wound has shown signs as of today of turning around last week I thought so as well and definitely he could be seen in this week's overall appearance and measurements. In general I'm very pleased with the fact that he finally seems to be making a steady but sure progress. The patient likewise is very pleased. 05/17/18 on evaluation today patient appears to be doing more poorly unfortunately in regard to his ulcer. He has been tolerating the dressing changes without complication. With that being said he tells me that in the past couple of days he and his wife have noticed that we did not seem to be doing quite as well is getting dark near the center. Subsequently upon evaluation today the wound actually does appear to be doing worse compared to previous. He has been tolerating the dressing changes otherwise and he states that he is not been sitting up  anymore than he was in the past from what he tells me. Still he has continued to work he states "I'm tired of dealing with this and if I have to just go home and lay in the bed all the time that's what I'll do". Nonetheless I am concerned about the fact that this wound does appear to be deeper than what it was previous. 05/24/18 upon evaluation today patient actually presents after having been in the hospital due to what was presumed to be sepsis secondary to the wound infection. He had an elevated white blood cell count between 14 and 15. With that being said he does seem to be doing somewhat better now. His wound still is giving him some trouble nonetheless and he is obviously concerned about the fact likely talked about that this does seem to go more deeply than previously noted. I did review his wound culture which showed evidence of Staphylococcus aureus him and group B strep. Nonetheless he is on antibiotics, Levaquin, for this. Subsequently I did review his intake summary from the hospital as well. I also did look at the CT of the lumbar spine with contrast that was performed which showed no bone destruction to suggest lumbar disguises/osteomyelitis or sacral osteomyelitis. There was no paraspinal abscess. Nonetheless it appears this may have been more of just a soft tissue infection at this point which is good news. He still is nonetheless concerned about the wound which again I think is completely reasonable considering everything he's been through recently. 05/31/18 on evaluation today on evaluation today patient actually appears to be showing signs of his wound be a little bit deeper than what I would like to see. Fortunately he does not show any signs of significant infection although his temperature was 99 today he states he's been checking this at home and has not been elevated. Nonetheless with the undermining that I'm seeing at this point I am becoming more concerned about the wound I  do think that offloading is a key factor here that is preventing the speedy recovery at this point. There does not appear to be any evidence of again over infection noted. He's been using Santyl currently. MARCELIS, WISSNER ([id number]) 06/07/18 the patient presents today for follow-up evaluation regarding the left ulcer in the gluteal region. He has been tolerating the Wound VAC fairly well. He  is obviously very frustrated with this he states that to mean is really getting in his way. There does not appear to be any evidence of infection at this time he does have a little bit of odor I do not necessarily associate this with infection just something that we sometimes notice with Wound VAC therapy. With that being said I can definitely catch a tone of discontentment overall in the patient's demeanor today. This when he was previously in the hospital an CT scan was done of the lumbar region which did not reveal any signs of osteomyelitis. With that being said the pelvis in particular was not evaluated distinctly which means he could still have some osteonecrosis I. Nonetheless the Wound VAC was started on Thursday I do want to get this little bit more time before jumping to a CT scan of the pelvis although that is something that I might would recommend if were not see an improvement by that time. 06/14/18 on evaluation today patient actually appears to be doing about the same in regard to his right gluteal ulcer. Again he did have a CT scan of the lumbar spine unfortunately this did not include the pelvis. Nonetheless with the depth of the wound that I'm seeing today even despite the fact that I'm not seeing any evidence of overt cellulitis I believe there's a good chance that we may be dealing with osteomyelitis somewhere in the right Ischial region. No fevers, chills, nausea, or vomiting noted at this time. 06/21/18 on evaluation today patient actually appears to be doing about the same with  regard to his wound. The tunnel at 6 o'clock really does not appear to be any deeper although it is a little bit wider. I think at this point you may want to start packing this with white phone. Unfortunately I have not got approval for the CT scan of the pelvis as of yet due to the fact that Medicare apparently has been denied it due to the diagnosis codes not being appropriate according to Medicare for the test requested. With that being said the patient cannot have an MRI and therefore this is the only option that we have as far as testing is concerned. The patient has had infection and was on antibiotics and been added code for cellulitis of the bottom to see if this will be appropriate for getting the test approved. Nonetheless I'm concerned about the infection have been spread deeper into the Ischial region. 06/28/18 on evaluation today patient actually appears to be doing rather well all things considered in regard to the right gluteal ulcer. He has been tolerating the dressing changes without complication. With that being said the Wound VAC he states does have to be replaced almost every day or at least reinforced unfortunately. Patient actually has his CT scan later this morning we should have the results by tomorrow. 07/05/18 on evaluation today patient presents for follow-up concerning his right Ischial ulcer. He did see the surgeon Dr. Lysle Pearl last week. They were actually very happy with him and felt like he spent a tremendous amount of time with them as far as discussing his situation was concerned. In the end Dr. Lysle Pearl did contact me as well and determine that he would not recommend any surgical intervention at this point as he felt like it would not be in the patient's best interest based on what he was seeing. He recommended a referral to infectious disease. Subsequently this is something that Dr. Ines Bloomer office is working on  setting up for the patient. As far as evaluation today is  concerned the patient's wound actually appears to be worse at this point. I am concerned about how things are progressing and specifically about infection. I do not feel like it's the deeper but the area of depth is definitely widening which does have me concerned. No fevers, chills, nausea, or vomiting noted at this time. I think that we do need initiate antibiotic therapy the patient has an allow allergy to amoxicillin/penicillin he states that he gets a rash since childhood. Nonetheless she's never had the issues with Catholics or cephalosporins in general but he is aware of. 07/27/18 on evaluation today patient presents following admission to the hospital on 07/09/18. He was subsequently discharged on 07/20/18. On 07/15/18 the patient underwent irrigation and debridement was soft tissue biopsy and bone biopsy as well as placement of a Wound VAC in the OR by Dr. Celine Ahr. During the hospital course the patient was placed on a Wound VAC and recommended follow up with surgery in three weeks actually with Dr. Delaine Lame who is infectious disease. The patient was on vancomycin during the hospital course. 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 He also had a bone culture which revealed evidence of methicillin-resistant staph aureus. He is updated CT scan 07/09/18 reveals that he had progression of the which was performed on wound to breakdown down to the trochanter where he actually had irregularities there as well suggestive of osteomyelitis. This was a change just since 9 December when we last performed a CT scan. Obviously this one had gone downhill quite significantly and rapidly. At this point upon evaluation I feel like in general the patient's wound seems to be doing fairly well all things considered upon my evaluation today. Obviously this is larger and deeper than what I previously evaluated but at the same time he seems to be making some progress as far as the  appearance of the granulation tissue is concerned. I'm happy in that regard. No fevers, chills, nausea, or vomiting noted at this time. He is on IV vancomycin and Rocephin at the facility. He is currently in [facility]. 08/03/18 upon evaluation today patient's wound appears to be doing better in regard to the overall appearance at this point in time. Fortunately he's been tolerating the Wound VAC without complication and states that the facility has been taking DINH, AYOTTE. ([id number]) excellent care of the wound site. Overall I see some Slough noted on the surface which I am going to attempt sharp debridement today of but nonetheless other than this I feel like he's making progress. 08/09/18 on evaluation today patient's wound appears to be doing much better compared to even last week's evaluation. Do believe that the Wound VAC is been of great benefit for him. He has been tolerating the dressing changes that is the Wound VAC without any complication and he has excellent granulation noted currently. There is no need for sharp debridement at this point. 08/16/18 on evaluation today patient actually appears to be doing very well in regard to the wound in the right gluteal fold region. This is showing signs of progress and again appears to be very healthy which is excellent news. Fortunately there is no sign of active infection by way of odor or drainage at this point. Overall I'm very pleased with how things stand. He seems to be tolerating the Wound VAC without complication. 08/23/18 on evaluation today patient actually appears to be doing  better in regard to his wound. He has been tolerating the Wound VAC without complication and in fact it has been collecting a significant amount of drainage which I think is good news especially considering how the wound appears. Fortunately there is no signs of infection at this time definitely nothing appears to be worse which is good news. He has  not been started on the Bactrim and Flagyl that was recommended by Dr. Delaine Lame yet. I did actually contact her office this morning in order to check and see were things are that regard their gonna be calling me back. 08/30/18 on evaluation today patient actually appears to show signs of excellent improvement today compared to last evaluation. The undermining is getting much better the wound seems to be feeling quite nicely and I'm very pleased that the granulation in general. With that being said overall I feel like the patient has made excellent progress which is great news. No fevers, chills, nausea, or vomiting noted at this time. 09/06/18 on evaluation today patient actually appears to be doing rather well in regard to his right gluteal ulcer. This is showing signs of improvement in overall I'm very pleased with how things seem to be progressing. The patient likewise is please. Overall I see no evidence of infection he is about to complete his oral antibiotic regimen which is the end of the antibiotics for him in just about three days. 09/13/18 on evaluation today patient's right Ischial ulcer appears to be showing signs of continued improvement which is excellent news. He's been tolerating the dressing changes without complication. Fortunately there's no signs of infection and the wound that seems to be doing very well. 09/28/18 on evaluation today patient appears to be doing rather well in regard to his right Ischial ulcer. He's been tolerating the Wound VAC without complication he knows there's much less drainage than there used to be this obviously is not a bad thing in my pinion. There's no evidence of infection despite the fact is but nothing about it now for several weeks. 10/04/18 on evaluation today patient appears to be doing better in regard to his right Ischial wound. He has been tolerating the Wound VAC without complication and I do believe that the silver nitrate last week was  beneficial for him. Fortunately overall there's no evidence of active infection at this time which is great news. No fevers, chills, nausea, or vomiting noted at this time. 10/11/18 on evaluation today patient actually appears to be doing rather well in regard to his Ischial ulcer. He's been tolerating the Wound VAC still without complication I feel like this is doing a good job. No fevers, chills, nausea, or vomiting noted at this time. 11/01/18 on evaluation today patient presents after having not been seen in our clinic for several weeks secondary to the fact that he was on evaluation today patient presents after having not been seen in our clinic for several weeks secondary to the fact that he was in a skilled nursing facility which was on lockdown currently due to the covert 19 national emergency. Subsequently he was discharged from the facility on this past Friday and subsequently made an appointment to come in to see yesterday. Fortunately there's no signs of active infection at this time which is good news and overall he does seem to have made progress since I last saw. Overall I feel like things are progressing quite nicely. The patient is having no pain. 11/08/18 on evaluation today patient appears to be doing  okay in regard to his right gluteal ulcer. He has been utilizing a Wound VAC home health this changing this at this point since he's home from the skilled nursing facility. Fortunately there's no signs of obvious active infection at this time. Unfortunately though there's no obvious active infection he is having some maceration and his wife states that when the sheets of the Wound VAC office on Sunday when it broke seal that he ended up having significant issues with some smell as well there concerned about the possibility of infection. Fortunately there's No fevers, chills, nausea, or vomiting noted at this time. OLUWASEUN, CREMER ([id number]) 11/15/18 on evaluation today patient  actually appears to be doing well in regard to his right gluteal ulcer. He has been tolerating the dressing changes without complication. Specifically the Wound VAC has been utilized up to this point. Fortunately there's no signs of infection and overall I feel like he has made progress even since last week when I last saw him. I'm actually fairly happy with the overall appearance although he does seem to have somewhat of a hyper granular overgrowth in the central portion of the wound which I think may require some sharp debridement to try flatness out possibly utilizing chemical cauterization following. 11/23/18 on evaluation today patient actually appears to be doing very well in regard to his sacral ulcer. He seems to be showing signs of improvement with good granulation. With that being said he still has the small area of hyper granulation right in the central portion of the wound which I'm gonna likely utilize silver nitrate on today. Subsequently he also keeps having a leak at the 6 o'clock location which is unfortunate we may be able to help out with some suggestions to try to prevent this going forward. Fortunately there's no signs of active infection at this time. 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 He's been tolerating the dressing changes without complication. Fortunately there's no signs of active infection at this time. I've been rather pleased with how things have progressed there still some evidence of pressure getting to the area with some redness right around the immediate wound opening. Nonetheless other than this I'm not seeing any significant complications or issues the wound is somewhat hyper granular. Upon discussing with the patient and his wife today I'm not sure that the wound is being packed to the base with the foam at this point. And if it's not been packed fully that  may be part of the reason why is not seen as much improvement as far as the granulation from the base out. Again we do not want pack too tightly but we need some of the firm to get to the base of the wound. I discussed this with patient and his wife today. 12/06/18 on evaluation today patient appears to be doing well in regard to his right gluteal pressure ulcer. He's been tolerating the dressing changes without complication. Fortunately there's no signs of active infection. He still has some hyper granular tissue and I do think it would be appropriate to continue with the chemical cauterization as of today. 12/16/18 on evaluation today patient actually appears to be doing okay in regard to his right gluteal ulcer. He is been tolerating the dressing changes without complication including the Wound VAC. Overall I feel like nothing seems to be worsening I do feel like that the hyper granulation buds  in the central portion of the wound have improved to some degree with the silver nitrate. We will have to see how things continue to progress. 12/20/18 on evaluation today patient actually appears to be doing much worse in my pinion even compared to last week's evaluation. Unfortunately as opposed to showing any signs of improvement the areas of hyper granular tissue in the central portion of the wound seem to be getting worse. Subsequently the wound bed itself also seems to be getting deeper even compared to last week which is both unusual as well as concerning since prior he had been shown signs of improvement. Nonetheless I think that the issue could be that he's actually having some difficulty in issues with a deeper infection. There's no external signs of infection but nonetheless I am more worried about the internal, osteomyelitis, that could be restarting. He has not been on antibiotics for some time at this point. I think that it may be a good idea to go ahead and started back on an antibiotic therapy  while we wait to see what the testing shows. 12/27/18 on evaluation today patient presents for follow-up concerning his left gluteal fold wound. Fortunately he appears to be doing well today. I did review the CT scan which was negative for any signs of osteomyelitis or acute abnormality this is excellent news. Overall I feel like the surface of the wound bed appears to be doing significantly better today compared to previously noted findings. There does not appear any signs of infection nor does he have any pain at this time. 01/03/19 on evaluation today patient actually appears to be doing quite well in regard to his ulcer. Post debridement last week he really did not have too much bleeding which is good news. Fortunately today this seems to be doing some better but we still has some of the hyper granular tissue noted in the base of the wound which is gonna require sharp debridement today as well. Overall I'm pleased with how things seem to be progressing since we switched away from the Wound VAC I think he is making some progress. 01/10/19 on evaluation today patient appears to be doing better in regard to his right gluteal fold ulcer. He has been tolerating the dressing changes without complication. The debridement to seem to be helping with current away some of the poor hyper granular tissue bugs throughout the region of his gluteal fold wound. He's been tolerating the dressing changes otherwise without complication which is great news. No fevers, chills, nausea, or vomiting noted at this time. 01/17/19 on evaluation today patient actually appears to be doing excellent in regard to his wound. He's been tolerating the dressing changes without complication. Fortunately there is no signs of active infection at this time which is great news. No fevers, chills, nausea, or vomiting noted at this time. TRITON, HEIDRICH ([id number]) 01/24/19 on evaluation today patient actually appears to be doing quite  well with regard to his ulcer. He has been tolerating the dressing changes without complication. Fortunately there's no signs of active infection at this time. Overall been very pleased with the progress that he seems to be making currently. 01/31/19-Patient returns at 1 week with apparent similarity in dimensions to the wound, with no signs of infection, he has been changing dressings twice a day 02/08/19 upon evaluation today patient actually appears to be doing well with regard to his right Ischial ulcer. The wound is not appear to be quite as deep and seems to  be making progress which is good news. With that being said I'm still reluctant to go back to the Wound VAC at this point. He's been having to change the dressings twice a day which is a little bit much in my pinion from the wound care supplies standpoint. I think that possibly attempting to utilize extras orbit may be beneficial this may also help to prevent any additional breakdown secondary to fluid retention in the wound itself. The patient is in agreement with giving this a try. 02/15/19 on evaluation today patient actually appears to be doing decently well with regard to his ulcer in the right to gluteal fold location. He's been tolerating the dressing changes without complication. Fortunately there is no signs of active infection at this time. He is able to keep the current dressing in place more effectively for a day at a time whereas before he was having a changes to to three times a day. The actions or has been helpful in this regard. Fortunately there's no signs of anything getting worse and I do feel like he showing signs of good improvement with regard to the wound bed status. 02/22/2019 patient appears to be doing very well today with regard to his ulcer in the gluteal fold. Fortunately there is no signs of active infection and he has been tolerating the dressing changes without any complication. Overall extremely pleased  with how things seem to be progressing. He has much less of the hyper granular projections within the wound these have slowly been debrided away and he seems to be doing well. The wound bed is more uniform. 03/01/19 on evaluation today patient appears to be doing unfortunately about the same in regard to his gluteal ulcer. He's been tolerating the dressing changes without complication. Fortunately there's no signs of active infection at this time. With that being said he continues to develop these hyper granular projections which I'm unsure of exactly what they are and why they are rising. Nonetheless I explained to the patient that I do believe it would be a good idea for [place] to stand a biopsy sample for pathology to see if that can shed any light on what exactly may be going on here. Fortunately I do not see any obvious signs of infection. With that being said the patient has had a little bit more drainage this week apparently compared to last week. Patient History Information obtained from Patient. Family History Hypertension - Father, Stroke - Mother, No family history of Cancer, Diabetes, Heart Disease, Kidney Disease, Lung Disease, Seizures, Thyroid Problems, Tuberculosis. Social History Never smoker, Marital Status - Married, Alcohol Use - Never, Drug Use - No History, Caffeine Use - Daily. Medical History Eyes Patient has history of Cataracts - both removed Denies history of Glaucoma, Optic Neuritis Ear/Nose/Mouth/Throat Denies history of Chronic sinus problems/congestion, Middle ear problems Hematologic/Lymphatic Denies history of Anemia, Hemophilia, Human Immunodeficiency Virus, Lymphedema Respiratory Denies history of Aspiration, Asthma, Chronic Obstructive Pulmonary Disease (COPD), Pneumothorax, Sleep Apnea, Tuberculosis Cardiovascular Patient has history of Hypertension - takes medication Denies history of Angina, Arrhythmia, Congestive Heart Failure, Coronary Artery Disease,  Deep Vein Thrombosis, Hypotension, Myocardial Infarction, Peripheral Arterial Disease, Peripheral Venous Disease, Phlebitis, Vasculitis Gastrointestinal Tiner, Kelton J. ([id number]) Denies history of Cirrhosis , Colitis, Crohn s, Hepatitis A, Hepatitis B, Hepatitis C Endocrine Denies history of Type I Diabetes, Type II Diabetes Genitourinary Denies history of End Stage Renal Disease Immunological Denies history of Lupus Erythematosus, Raynaud s, Scleroderma Integumentary (Skin) Denies history of History of  Burn, History of pressure wounds Musculoskeletal Denies history of Gout, Rheumatoid Arthritis, Osteoarthritis, Osteomyelitis Neurologic Patient has history of Paraplegia - waist down Denies history of Dementia, Neuropathy, Quadriplegia, Seizure Disorder Oncologic Denies history of Received Chemotherapy, Received Radiation Psychiatric Denies history of Anorexia/bulimia, Confinement Anxiety Medical And Surgical History Notes Oncologic Prostate cancer- currently treated with horomone therapy Review of Systems (ROS) Constitutional Symptoms (General Health) Denies complaints or symptoms of Fatigue, Fever, Chills, Marked Weight Change. Respiratory Denies complaints or symptoms of Chronic or frequent coughs, Shortness of Breath. Cardiovascular Denies complaints or symptoms of Chest pain, LE edema. Psychiatric Denies complaints or symptoms of Anxiety, Claustrophobia. Objective Constitutional Well-nourished and well-hydrated in no acute distress. Vitals Time Taken: 8:05 AM, Height: 73 in, Weight: 210 lbs, BMI: 27.7, Temperature: 97.8 F, Pulse: 87 bpm, Respiratory Rate: 16 breaths/min, Blood Pressure: 122/68 mmHg. Respiratory normal breathing without difficulty. Psychiatric this patient is able to make decisions and demonstrates good insight into disease process. Alert and Oriented x 3. pleasant and cooperative. General Notes: Patient's wound bed currently showed signs of  good granulation at this time. Fortunately there is not appear to KELLIN, BARTLING. ([id number]) be any signs of active infection. The one issue that I see is that he continues to develop these hyper granular nodules or projections and again I'm unsure of exactly why these are occurring. Fortunately there is no sign of systemic infection at this time which is good news. No debridement was performed today though I did obtain a biopsy sample to sin for pathology. Integumentary (Hair, Skin) Wound #1 status is Open. Original cause of wound was Pressure Injury Stage 4. The wound is located on the Right Gluteal fold. The wound measures 3.5cm length x 2.5cm width x 3.2cm depth; 6.872cm^2 area and 21.991cm^3 volume. There is muscle and Fat Layer (Subcutaneous Tissue) Exposed exposed. There is tunneling at 12:00 with a maximum distance of 4cm. There is a large amount of serosanguineous drainage noted. The wound margin is epibole. There is medium (34-66%) red, pink, hyper - granulation within the wound bed. There is a medium (34-66%) amount of necrotic tissue within the wound bed including Adherent Slough. 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 There was a biopsy performed by [name], Aleem Elza E., [name]. There was a biopsy performed on Wound Bed. The skin was cleansed and prepped with anti- septic. Tissue was removed at its base with the following instrument(s): Forceps and Scissors and sent to pathology. A Large amount of bleeding was controlled with Pressure. A time out was conducted at 08:32, prior to the start of the procedure. The procedure was tolerated well with a pain level of 0 throughout and a pain  level of 0 following the procedure. Post procedure Diagnosis Wound #1: Same as Pre-Procedure Plan Wound Cleansing: Wound #1 Right Gluteal fold: Other: - Cleanse wound with Dakin's Solution Anesthetic (add to Medication List): Wound #1 Right Gluteal fold: Topical Lidocaine 4% cream applied to wound bed prior to debridement (In Clinic Only). Skin Barriers/Peri-Wound Care: Wound #1 Right Gluteal fold: Other: - Apply zinc oxide to peri-wound area. Primary Wound Dressing: KENDRY, PFARR ([id number]) Wound #1 Right Gluteal fold: Hydrafera Blue Ready Transfer Secondary Dressing: Wound #1 Right Gluteal fold: XtraSorb - Equivalent product may be used  but please order a size that is appropriate for the wound size and location. Secure with Tegaderm Dressing Change Frequency: Wound #1 Right Gluteal fold: Change dressing every day. Follow-up Appointments: Wound #1 Right Gluteal fold: Return Appointment in 1 week. Off-Loading: Wound #1 Right Gluteal fold: Turn and reposition every 2 hours Laboratory ordered were: [facility] Bx report 1. My recommendation initially today is gonna be that we go ahead and continue with the current cleaning which is with the [facility] solution. Subsequently we will switch to [facility] Dressing which I think will be better for the patient as far as the hyper granulation is concerned. 2. I'm gonna suggest as well that we continue with the extras orbit externally. 3. With regard to patient's home health unfortunately they have discontinued services with him at this time. Nonetheless this will make it easier first order supplies although his wife is somewhat worried about things going poorly and her not realizing. Having the patient come in weekly for visits as we are I think things will be okay in this regard however. 4. I do recommend patient continue to appropriately offload at this point. Please see above for specific wound care orders. We will see patient  for re-evaluation in 1 week(s) here in the clinic. If anything worsens or changes patient will contact our office for additional recommendations. Electronic Signature(s) Signed: 03/06/2019 11:39:45 PM By: Worthy Keeler [name] Previous Signature: 03/02/2019 3:08:05 PM Version By: Worthy Keeler [name] Entered By: Worthy Keeler on 03/06/2019 23:39:23 AKASH, WINSKI ([id number]) -------------------------------------------------------------------------------- ROS/PFSH Details Patient Name: [name] Date of Service: 03/01/2019 8:00 AM Medical Record Number: [id number] Patient Account Number: [id number] Date of Birth/Sex: 1950/07/24 (68 y.o. M) Treating RN: Montey Hora Primary Care Provider: [name] Other Clinician: Referring Provider: [name] Treating Provider/Extender: [name], Aoki Wedemeyer Weeks in Treatment: 50 Information Obtained From Patient Constitutional Symptoms (General Health) Complaints and Symptoms: Negative for: Fatigue; Fever; Chills; Marked Weight Change Respiratory Complaints and Symptoms: Negative for: Chronic or frequent coughs; Shortness of Breath Medical History: Negative for: Aspiration; Asthma; Chronic Obstructive Pulmonary Disease (COPD); Pneumothorax; Sleep Apnea; Tuberculosis Cardiovascular Complaints and Symptoms: Negative for: Chest pain; LE edema Medical History: Positive for: Hypertension - takes medication Negative for: Angina; Arrhythmia; Congestive Heart Failure; Coronary Artery Disease; Deep Vein Thrombosis; Hypotension; Myocardial Infarction; Peripheral Arterial Disease; Peripheral Venous Disease; Phlebitis; Vasculitis Psychiatric Complaints and Symptoms: Negative for: Anxiety; Claustrophobia Medical History: Negative for: Anorexia/bulimia; Confinement Anxiety Eyes Medical History: Positive for: Cataracts - both removed Negative for: Glaucoma; Optic Neuritis Ear/Nose/Mouth/Throat Medical History: Negative for:  Chronic sinus problems/congestion; Middle ear problems Hematologic/Lymphatic Medical History: Negative for: Anemia; Hemophilia; Human Immunodeficiency Virus; Lymphedema KIMBALL, APPLEBY. ([id number]) Gastrointestinal Medical History: Negative for: Cirrhosis ; Colitis; Crohnos; Hepatitis A; Hepatitis B; Hepatitis C Endocrine Medical History: Negative for: Type I Diabetes; Type II Diabetes Genitourinary Medical History: Negative for: End Stage Renal Disease Immunological Medical History: Negative for: Lupus Erythematosus; Raynaudos; Scleroderma Integumentary (Skin) Medical History: Negative for: History of Burn; History of pressure wounds Musculoskeletal Medical History: Negative for: Gout; Rheumatoid Arthritis; Osteoarthritis; Osteomyelitis Neurologic Medical History: Positive for: Paraplegia - waist down Negative for: Dementia; Neuropathy; Quadriplegia; Seizure Disorder Oncologic Medical History: Negative for: Received Chemotherapy; Received Radiation Past Medical History Notes: Prostate cancer- currently treated with horomone therapy HBO Extended History Items Eyes: Cataracts Immunizations Pneumococcal Vaccine: Received Pneumococcal Vaccination: No Implantable Devices No devices added Family and Social History Cancer: No; Diabetes: No; Heart Disease: No; Hypertension: Yes - Father; Kidney  Disease: No; Lung Disease: No; Seizures: No; Stroke: Yes - Mother; Thyroid Problems: No; Tuberculosis: No; Never smoker; Marital Status - Married; Alcohol Use: Never; Drug Use: No History; Caffeine Use: Daily GILLERMO, POCH ([id number]) Physician Affirmation I have reviewed and agree with the above information. Electronic Signature(s) Signed: 03/02/2019 3:05:31 PM By: Montey Hora Signed: 03/02/2019 3:08:05 PM By: Worthy Keeler [name] Entered By: Worthy Keeler on 03/01/2019 22:48:38 WADDELL, ITEN  ([id number]) -------------------------------------------------------------------------------- SuperBill Details Patient Name: [name] Date of Service: 03/01/2019 Medical Record Number: [id number] Patient Account Number: [id number] Date of Birth/Sex: 1951-03-15 (68 y.o. 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, sequela I10 Essential (primary) hypertension Facility Procedures CPT4 Code Description: [id number] 11102-Tangential biopsy of skin (e.g., shave, scoop, saucerize, curette) single lesion ICD-10 Diagnosis Description L89.314 Pressure ulcer of right buttock, stage 4 Modifier: Quantity: 1 Physician Procedures CPT4 Code Description: 11102 Tangential biopsy of skin (e.g., shave, scoop, saucerize, curette) single lesion ICD-10 Diagnosis Description L89.314 Pressure ulcer of right buttock, stage 4 Modifier: Quantity: 1 Electronic Signature(s) Signed: 03/02/2019 3:08:05 PM By: Worthy Keeler [name] Previous Signature: 03/01/2019 4:46:42 PM Version By: Montey Hora Entered By: Worthy Keeler on 03/01/2019 22:51:14

## 2019-03-08 ENCOUNTER — Encounter: Payer: Medicare Other | Admitting: Physician Assistant

## 2019-03-08 ENCOUNTER — Other Ambulatory Visit: Payer: Self-pay

## 2019-03-08 DIAGNOSIS — L89314 Pressure ulcer of right buttock, stage 4: Secondary | ICD-10-CM | POA: Diagnosis not present

## 2019-03-08 NOTE — Progress Notes (Addendum)
[name] ([id number]) Visit Report for 03/08/2019 Chief Complaint Document Details Patient Name: [name], [name] Date of Service: 03/08/2019 8:00 AM Medical Record Number: [id number] Patient Account Number: [id number] Date of Birth/Sex: 1951-01-04 (68 y.o. M) Treating RN: Montey Hora Primary Care Provider: [name] Other Clinician: Referring Provider: [name] Treating Provider/Extender: [name], HOYT Weeks in Treatment: 49 Information Obtained from: Patient Chief Complaint Right gluteal fold ulcer Electronic Signature(s) Signed: 03/08/2019 8:18:36 AM By: Worthy Keeler [name] Entered By: Worthy Keeler on 03/08/2019 08:18:35 JONTY, MORRICAL ([id number]) -------------------------------------------------------------------------------- HPI Details Patient Name: [name] Date of Service: 03/08/2019 8:00 AM Medical Record Number: [id number] Patient Account Number: [id number] Date of Birth/Sex: Aug 24, 1950 (68 y.o. 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 He states that there is no history of diabetes that he is aware of. He does have issues with his prostate and is currently receiving treatment for this by way of oral medication. With that being said I do not have a lot of details in that regard. Nonetheless the patient presents today as a result of having been referred to [place] by another provider initially home health was set to come out and take care of his wound although due to the fact that  he apparently drives he's not able to receive home health. His wife is therefore trying to help take care of this wound within although they have been struggling with what exactly to do at this point. She states that she can do some things but she is definitely not a nurse and does have some issues with looking at blood. The good news is the wound does not appear to be too deep and is fairly superficial at this point. There is no slough noted there is some nonviable skin noted around the surface of the wound and the perimeter at this point. The central portion of the wound appears to be very good with a dermal layer noted this does not appear to be again deep enough to extend it to subcutaneous tissue at this point. 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 In the pump he has Dilaudid and baclofen. 12/07/17 on evaluation today patient presents for follow-up concerning his ongoing lower back thigh ulcer on the right. He states that he did not get the supplies ordered and therefore has not really been able to perform the dressing changes as directed exactly. His wife was able to get some Boarder Foam Dressing's from the drugstore and subsequently has been using hydrogel which did help to a degree in the wound does appear to be able smaller. There is actually more drainage this week noted than previous. 12/21/17 on evaluation today patient appears to be doing rather well in regard to his right gluteal ulcer. He has been tolerating the dressing changes without complication. There does not appear to be any evidence of infection at this point in time. Overall the wound does seem to be making some progress as far as the edges  are concerned there's not as much in the way of overlapping of the external wound edges and he has a good epithelium to wound bed border for the most part. This however is not true  right at the 12 o'clock location over the span of a little over a centimeters which actually will require debridement today to clean this away and hopefully allow it to continue to heal more appropriately. 12/28/17 on evaluation today patient appears to be doing rather well in regard to his ulcer in the left gluteal region. He's been tolerating the dressing changes without complication. Apparently he has had some difficulty getting his dressing material. Apparently there's been some confusion with ordering we're gonna check into this. Nonetheless overall he's been showing signs of improvement which is good news. Debridement is not required today. 01/04/18 on evaluation today patient presents for follow-up concerning his right gluteal ulcer. He has been tolerating the dressing changes fairly well. On inspection today it appears he may actually have some maceration them concerned about the fact that he may be developing too much moisture in and around the wound bed which can cause delay in healing. With that being said he unfortunately really has not showed significant signs of improvement since last week's evaluation in fact this may even be just the little bit/slightly larger. Nonetheless he's been having a lot of discomfort I'm not sure this is even related to the wound as he has no pain when I'm to breeding or otherwise cleaning the wound during evaluation today. Nonetheless this is something that we did recommend he talked to his pain specialist concerning. 01/11/18 on evaluation today patient appears to be doing better in regard to his ulceration. He has been tolerating the dressing changes without complication. With that being said overall there's no evidence of infection which is good news. The only thing is he did receive the hatch affair blue classic versus the ready nonetheless I feel like this is perfectly fine and appears to have done well for him over the past week. 01/25/18 on evaluation  today patient's wound actually appears to be a little bit larger than during the last evaluation. The good CARMICHAEL, BURDETTE. ([id number]) news is the majority of the wound edges actually appear to be fairly firmly attached to the wound bed unfortunately again we're not really making progress in regard to the size. Roughly the wound is about the same size as when I first saw him although again the wound margin/edges appear to be much better. 02/01/18 on evaluation today patient actually appears to be doing very well in regard to his wound. Applying the Prisma dry does seem to be better although he does still have issues with slow progression of the wound. There was a slight improvement compared to last week's measurements today. Nonetheless I have been considering other options as far as the possibility of Theraskin or even a snap vac. In general I'm not sure that the Theraskin due to location of the wound would be a very good idea. Nonetheless I do think that a snap vac could be a possibility for the patient and in fact I think this could even be an excellent way to manage the wound possibly seeing some improvement in a very rapid fashion here. Nonetheless this is something that we would need to get approved and I did have a lengthy conversation with the patient about this today. 02/08/18 on evaluation today patient appears to be doing a little better in regard to his ulcer.  He has been tolerating the dressing changes without complication. Fortunately despite the fact that the wound is a little bit smaller it's not significantly so unfortunately. We have discussed the possibility of a snap vac we did check with insurance this is actually covered at this point. Fortunately there does not appear to be any sign of infection. Overall I'm fairly pleased with how things seem to be appearing at this point. 02/15/18 on evaluation today patient appears to be doing rather well in regard to his right gluteal  ulcer. Unfortunately the snap vac did not stay in place with his sheer and friction this came loose and did not seem to maintain seal very well. He worked for about two days and it did seem to do very well during that time according to his wife but in general this does not seem to be something that's gonna be beneficial for him long-term. I do believe we need to go back to standard dressings to see if we can find something that will be of benefit. 03/02/18- He is here in follow up evaluation; there is minimal change in the wound. He will continue with the same treatment plan, would consider changing to iodosrob/iodoflex if ulcer continues to to plateau. He will follow up next week 03/08/18 on evaluation today patient's wound actually appears to be about the same size as when I previously saw him several weeks back. Unfortunately he does have some slightly dark discoloration in the central portion of the wound which has me concerned about pressure injury. I do believe he may be sitting for too long a period of time in fact he tells me that "I probably sit for much too long". He does have some Slough noted on the surface of the wound and again as far as the size of the wound is concerned I'm really not seeing anything that seems to have improved significantly. 03/15/18 on evaluation today patient appears to be doing fairly well in regard to his ulcer. The wound measured pretty much about the same today compared to last week's evaluation when looking at his graph. With that being said the area of bruising/deep tissue injury that was noted last week I do not see at this point. He did get a new cushion fortunately this does seem to be have been of benefit in my pinion. It does appear that he's been off of this more which is good news as well I think that is definitely showing in the overall wound measurements. With that being said I do believe that he needs to continue to offload I don't think that the fact  this is doing better should be or is going to allow him to not have to offload and explain this to him as well. Overall he seems to be in agreement the plan I think he understands. The overall appearance of the wound bed is improved compared to last week I think the Iodoflex has been beneficial in that regard. 03/29/18 on evaluation today patient actually appears to be doing rather well in regard to his wound from the overall appearance standpoint he does have some granulation although there's some Slough on the surface of the wound noted as well. With that being said he unfortunately has not improved in regard to the overall measurement of the wound in volume or in size. I did have a discussion with him very specifically about offloading today. He actually does work although he mainly is just sitting throughout the day. He tells me  he offloads by "lifting himself up for 30 seconds off of his chair occasionally" purchase from [employer] which does seem to have helped. And he has a new cushion that he with that being said he's also able to stand some for a very short period of time but not significant enough I think to provide appropriate offloading. I think the biggest issue at this point with the wound and the fact is not healing as quickly as we would like is due to the fact that he is really not able to appropriately offload while at work. He states the beginning after his injury he actually had a bed at his job that he could lay on in order to offload and that does seem to have been of help back at that time. Nonetheless he had not done this in quite some time unfortunately. I think that could be helpful for him this is something I would like for him to look into. 04/05/18 on evaluation today patient actually presents for follow-up concerning his right gluteal ulcer. Again he really is not significantly improved even compared to last week. He has been tolerating the dressing changes without  complication. With that being said fortunately there appears to be no evidence of infection at this time. He has been more proactive in trying to offload. AHREN, PETTINGER ([id number]) 04/12/18 on evaluation today patient actually appears to be doing a little better in regard to his wound and the right gluteal fold region. He's been tolerating the dressing changes since removing the oasis without complication. However he was having a lot of burning initially with the oasis in place. He's unsure of exactly why this was given so much discomfort but he assumes that it was the oasis itself causing the problem. Nonetheless this had to be removed after about three days in place although even those three days seem to have made a fairly good improvement in regard to the overall appearance of the wound bed. In fact is the first time that he's made any improvement from the standpoint of measurements in about six weeks. He continues to have no discomfort over the area of the wound itself which leads me to wonder why he was having the burning with the oasis when he does not even feel the actual debridement's themselves. I am somewhat perplexed by this. 04/19/18 on evaluation today patient's wound actually appears to be showing signs of epithelialization around the edge of the wound and in general actually appears to be doing better which is good news. He did have the same burning after about three days with applying the Endoform last week in the same fashion that I would generally apply a skin substitute. This seems to indicate that it's not the oasis to cause the problem but potentially the moisture buildup that just causes things to burn or there may be some other reaction with the skin prep or Steri-Strips. Nonetheless I'm not sure that is gonna be able to tolerate any skin substitute for a long period of time. The good news is the wound actually appears to be doing better today compared to last week and  does seem to finally be making some progress. 04/26/18 on evaluation today patient actually appears to be doing rather well in regard to his ulcer in the right gluteal fold. He has been tolerating the dressing changes without complication which is good news. The Endoform does seem to be helping although he was a little bit more macerated this week. This  seems to be an ongoing issue with fluid control at this point. Nonetheless I think we may be able to add something like Drawtex to help control the drainage. 05/03/18 on evaluation today patient appears to actually be doing better in regard to the overall appearance of his wound. He has been tolerating the dressing changes without complication. Fortunately there appears to be no evidence of infection at this time. I really feel like his wound has shown signs as of today of turning around last week I thought so as well and definitely he could be seen in this week's overall appearance and measurements. In general I'm very pleased with the fact that he finally seems to be making a steady but sure progress. The patient likewise is very pleased. 05/17/18 on evaluation today patient appears to be doing more poorly unfortunately in regard to his ulcer. He has been tolerating the dressing changes without complication. With that being said he tells me that in the past couple of days he and his wife have noticed that we did not seem to be doing quite as well is getting dark near the center. Subsequently upon evaluation today the wound actually does appear to be doing worse compared to previous. He has been tolerating the dressing changes otherwise and he states that he is not been sitting up anymore than he was in the past from what he tells me. Still he has continued to work he states "I'm tired of dealing with this and if I have to just go home and lay in the bed all the time that's what I'll do". Nonetheless I am concerned about the fact that this wound does  appear to be deeper than what it was previous. 05/24/18 upon evaluation today patient actually presents after having been in the hospital due to what was presumed to be sepsis secondary to the wound infection. He had an elevated white blood cell count between 14 and 15. With that being said he does seem to be doing somewhat better now. His wound still is giving him some trouble nonetheless and he is obviously concerned about the fact likely talked about that this does seem to go more deeply than previously noted. I did review his wound culture which showed evidence of Staphylococcus aureus him and group B strep. Nonetheless he is on antibiotics, Levaquin, for this. Subsequently I did review his intake summary from the hospital as well. I also did look at the CT of the lumbar spine with contrast that was performed which showed no bone destruction to suggest lumbar disguises/osteomyelitis or sacral osteomyelitis. There was no paraspinal abscess. Nonetheless it appears this may have been more of just a soft tissue infection at this point which is good news. He still is nonetheless concerned about the wound which again I think is completely reasonable considering everything he's been through recently. 05/31/18 on evaluation today on evaluation today patient actually appears to be showing signs of his wound be a little bit deeper than what I would like to see. Fortunately he does not show any signs of significant infection although his temperature was 99 today he states he's been checking this at home and has not been elevated. Nonetheless with the undermining that I'm seeing at this point I am becoming more concerned about the wound I do think that offloading is a key factor here that is preventing the speedy recovery at this point. There does not appear to be any evidence of again over infection noted. He's been using  Santyl currently. 06/07/18 the patient presents today for follow-up evaluation  regarding the left ulcer in the gluteal region. He has been tolerating the Wound VAC fairly well. He is obviously very frustrated with this he states that to mean is really getting in his way. There does not appear to be any evidence of infection at this time he does have a little bit of odor I do not necessarily associate this with infection just something that we sometimes notice with Wound VAC therapy. With that being said I can definitely catch a tone of discontentment overall in the patient's demeanor today. This when he was previously in the hospital DRAYCEN, LEICHTER. ([id number]) an CT scan was done of the lumbar region which did not reveal any signs of osteomyelitis. With that being said the pelvis in particular was not evaluated distinctly which means he could still have some osteonecrosis I. Nonetheless the Wound VAC was started on Thursday I do want to get this little bit more time before jumping to a CT scan of the pelvis although that is something that I might would recommend if were not see an improvement by that time. 06/14/18 on evaluation today patient actually appears to be doing about the same in regard to his right gluteal ulcer. Again he did have a CT scan of the lumbar spine unfortunately this did not include the pelvis. Nonetheless with the depth of the wound that I'm seeing today even despite the fact that I'm not seeing any evidence of overt cellulitis I believe there's a good chance that we may be dealing with osteomyelitis somewhere in the right Ischial region. No fevers, chills, nausea, or vomiting noted at this time. 06/21/18 on evaluation today patient actually appears to be doing about the same with regard to his wound. The tunnel at 6 o'clock really does not appear to be any deeper although it is a little bit wider. I think at this point you may want to start packing this with white phone. Unfortunately I have not got approval for the CT scan of the pelvis as of yet  due to the fact that Medicare apparently has been denied it due to the diagnosis codes not being appropriate according to Medicare for the test requested. With that being said the patient cannot have an MRI and therefore this is the only option that we have as far as testing is concerned. The patient has had infection and was on antibiotics and been added code for cellulitis of the bottom to see if this will be appropriate for getting the test approved. Nonetheless I'm concerned about the infection have been spread deeper into the Ischial region. 06/28/18 on evaluation today patient actually appears to be doing rather well all things considered in regard to the right gluteal ulcer. He has been tolerating the dressing changes without complication. With that being said the Wound VAC he states does have to be replaced almost every day or at least reinforced unfortunately. Patient actually has his CT scan later this morning we should have the results by tomorrow. 07/05/18 on evaluation today patient presents for follow-up concerning his right Ischial ulcer. He did see the surgeon Dr. Lysle Pearl last week. They were actually very happy with him and felt like he spent a tremendous amount of time with them as far as discussing his situation was concerned. In the end Dr. Lysle Pearl did contact me as well and determine that he would not recommend any surgical intervention at this point as he felt like  it would not be in the patient's best interest based on what he was seeing. He recommended a referral to infectious disease. Subsequently this is something that Dr. Ines Bloomer office is working on setting up for the patient. As far as evaluation today is concerned the patient's wound actually appears to be worse at this point. I am concerned about how things are progressing and specifically about infection. I do not feel like it's the deeper but the area of depth is definitely widening which does have me concerned. No  fevers, chills, nausea, or vomiting noted at this time. I think that we do need initiate antibiotic therapy the patient has an allow allergy to amoxicillin/penicillin he states that he gets a rash since childhood. Nonetheless she's never had the issues with Catholics or cephalosporins in general but he is aware of. 07/27/18 on evaluation today patient presents following admission to the hospital on 07/09/18. He was subsequently discharged on 07/20/18. On 07/15/18 the patient underwent irrigation and debridement was soft tissue biopsy and bone biopsy as well as placement of a Wound VAC in the OR by Dr. Celine Ahr. During the hospital course the patient was placed on a Wound VAC and recommended follow up with surgery in three weeks actually with Dr. Delaine Lame who is infectious disease. The patient was on vancomycin during the hospital course. 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 He also had a bone culture which revealed evidence of methicillin-resistant staph aureus. He is updated CT scan 07/09/18 reveals that he had progression of the which was performed on wound to breakdown down to the trochanter where he actually had irregularities there as well suggestive of osteomyelitis. This was a change just since 9 December when we last performed a CT scan. Obviously this one had gone downhill quite significantly and rapidly. At this point upon evaluation I feel like in general the patient's wound seems to be doing fairly well all things considered upon my evaluation today. Obviously this is larger and deeper than what I previously evaluated but at the same time he seems to be making some progress as far as the appearance of the granulation tissue is concerned. I'm happy in that regard. No fevers, chills, nausea, or vomiting noted at this time. He is on IV vancomycin and Rocephin at the facility. He is currently in [employer]. 08/03/18 upon evaluation today  patient's wound appears to be doing better in regard to the overall appearance at this point in time. Fortunately he's been tolerating the Wound VAC without complication and states that the facility has been taking excellent care of the wound site. Overall I see some Slough noted on the surface which I am going to attempt sharp debridement today of but nonetheless other than this I feel like he's making progress. 08/09/18 on evaluation today patient's wound appears to be doing much better compared to even last week's evaluation. Do believe that the Wound VAC is been of great benefit for him. He has been tolerating the dressing changes that is the Wound JORI, THRALL. ([id number]) VAC without any complication and he has excellent granulation noted currently. There is no need for sharp debridement at this point. 08/16/18 on evaluation today patient actually appears to be doing very well in regard to the wound in the right gluteal fold region. This is showing signs of progress and again appears to be very healthy which is excellent news. Fortunately there is no sign of active infection by way of odor  or drainage at this point. Overall I'm very pleased with how things stand. He seems to be tolerating the Wound VAC without complication. 08/23/18 on evaluation today patient actually appears to be doing better in regard to his wound. He has been tolerating the Wound VAC without complication and in fact it has been collecting a significant amount of drainage which I think is good news especially considering how the wound appears. Fortunately there is no signs of infection at this time definitely nothing appears to be worse which is good news. He has not been started on the Bactrim and Flagyl that was recommended by Dr. Delaine Lame yet. I did actually contact her office this morning in order to check and see were things are that regard their gonna be calling me back. 08/30/18 on evaluation today patient  actually appears to show signs of excellent improvement today compared to last evaluation. The undermining is getting much better the wound seems to be feeling quite nicely and I'm very pleased that the granulation in general. With that being said overall I feel like the patient has made excellent progress which is great news. No fevers, chills, nausea, or vomiting noted at this time. 09/06/18 on evaluation today patient actually appears to be doing rather well in regard to his right gluteal ulcer. This is showing signs of improvement in overall I'm very pleased with how things seem to be progressing. The patient likewise is please. Overall I see no evidence of infection he is about to complete his oral antibiotic regimen which is the end of the antibiotics for him in just about three days. 09/13/18 on evaluation today patient's right Ischial ulcer appears to be showing signs of continued improvement which is excellent news. He's been tolerating the dressing changes without complication. Fortunately there's no signs of infection and the wound that seems to be doing very well. 09/28/18 on evaluation today patient appears to be doing rather well in regard to his right Ischial ulcer. He's been tolerating the Wound VAC without complication he knows there's much less drainage than there used to be this obviously is not a bad thing in my pinion. There's no evidence of infection despite the fact is but nothing about it now for several weeks. 10/04/18 on evaluation today patient appears to be doing better in regard to his right Ischial wound. He has been tolerating the Wound VAC without complication and I do believe that the silver nitrate last week was beneficial for him. Fortunately overall there's no evidence of active infection at this time which is great news. No fevers, chills, nausea, or vomiting noted at this time. 10/11/18 on evaluation today patient actually appears to be doing rather well in regard  to his Ischial ulcer. He's been tolerating the Wound VAC still without complication I feel like this is doing a good job. No fevers, chills, nausea, or vomiting noted at this time. 11/01/18 on evaluation today patient presents after having not been seen in our clinic for several weeks secondary to the fact that he was on evaluation today patient presents after having not been seen in our clinic for several weeks secondary to the fact that he was in a skilled nursing facility which was on lockdown currently due to the covert 19 national emergency. Subsequently he was discharged from the facility on this past Friday and subsequently made an appointment to come in to see yesterday. Fortunately there's no signs of active infection at this time which is good news and overall he does  seem to have made progress since I last saw. Overall I feel like things are progressing quite nicely. The patient is having no pain. 11/08/18 on evaluation today patient appears to be doing okay in regard to his right gluteal ulcer. He has been utilizing a Wound VAC home health this changing this at this point since he's home from the skilled nursing facility. Fortunately there's no signs of obvious active infection at this time. Unfortunately though there's no obvious active infection he is having some maceration and his wife states that when the sheets of the Wound VAC office on Sunday when it broke seal that he ended up having significant issues with some smell as well there concerned about the possibility of infection. Fortunately there's No fevers, chills, nausea, or vomiting noted at this time. 11/15/18 on evaluation today patient actually appears to be doing well in regard to his right gluteal ulcer. He has been tolerating the dressing changes without complication. Specifically the Wound VAC has been utilized up to this point. Fortunately there's no signs of infection and overall I feel like he has made progress even  since last week when I last saw him. I'm actually fairly happy with the overall appearance although he does seem to have somewhat of a hyper granular Eblen, Crystal J. ([id number]) overgrowth in the central portion of the wound which I think may require some sharp debridement to try flatness out possibly utilizing chemical cauterization following. 11/23/18 on evaluation today patient actually appears to be doing very well in regard to his sacral ulcer. He seems to be showing signs of improvement with good granulation. With that being said he still has the small area of hyper granulation right in the central portion of the wound which I'm gonna likely utilize silver nitrate on today. Subsequently he also keeps having a leak at the 6 o'clock location which is unfortunate we may be able to help out with some suggestions to try to prevent this going forward. Fortunately there's no signs of active infection at this time. 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 He's been tolerating the dressing changes without complication. Fortunately there's no signs of active infection at this time. I've been rather pleased with how things have progressed there still some evidence of pressure getting to the area with some redness right around the immediate wound opening. Nonetheless other than this I'm not seeing any significant complications or issues the wound is somewhat hyper granular. Upon discussing with the patient and his wife today I'm not sure that the wound is being packed to the base with the foam at this point. And if it's not been packed fully that may be part of the reason why is not seen as much improvement as far as the granulation from the base out. Again we do not want pack too tightly but we need some of the firm to get to the base of the wound. I discussed this with patient and his wife  today. 12/06/18 on evaluation today patient appears to be doing well in regard to his right gluteal pressure ulcer. He's been tolerating the dressing changes without complication. Fortunately there's no signs of active infection. He still has some hyper granular tissue and I do think it would be appropriate to continue with the chemical cauterization as of today. 12/16/18 on evaluation today patient actually appears to be doing okay in regard to his right  gluteal ulcer. He is been tolerating the dressing changes without complication including the Wound VAC. Overall I feel like nothing seems to be worsening I do feel like that the hyper granulation buds in the central portion of the wound have improved to some degree with the silver nitrate. We will have to see how things continue to progress. 12/20/18 on evaluation today patient actually appears to be doing much worse in my pinion even compared to last week's evaluation. Unfortunately as opposed to showing any signs of improvement the areas of hyper granular tissue in the central portion of the wound seem to be getting worse. Subsequently the wound bed itself also seems to be getting deeper even compared to last week which is both unusual as well as concerning since prior he had been shown signs of improvement. Nonetheless I think that the issue could be that he's actually having some difficulty in issues with a deeper infection. There's no external signs of infection but nonetheless I am more worried about the internal, osteomyelitis, that could be restarting. He has not been on antibiotics for some time at this point. I think that it may be a good idea to go ahead and started back on an antibiotic therapy while we wait to see what the testing shows. 12/27/18 on evaluation today patient presents for follow-up concerning his left gluteal fold wound. Fortunately he appears to be doing well today. I did review the CT scan which was negative for any signs of  osteomyelitis or acute abnormality this is excellent news. Overall I feel like the surface of the wound bed appears to be doing significantly better today compared to previously noted findings. There does not appear any signs of infection nor does he have any pain at this time. 01/03/19 on evaluation today patient actually appears to be doing quite well in regard to his ulcer. Post debridement last week he really did not have too much bleeding which is good news. Fortunately today this seems to be doing some better but we still has some of the hyper granular tissue noted in the base of the wound which is gonna require sharp debridement today as well. Overall I'm pleased with how things seem to be progressing since we switched away from the Wound VAC I think he is making some progress. 01/10/19 on evaluation today patient appears to be doing better in regard to his right gluteal fold ulcer. He has been tolerating the dressing changes without complication. The debridement to seem to be helping with current away some of the poor hyper granular tissue bugs throughout the region of his gluteal fold wound. He's been tolerating the dressing changes otherwise without complication which is great news. No fevers, chills, nausea, or vomiting noted at this time. 01/17/19 on evaluation today patient actually appears to be doing excellent in regard to his wound. He's been tolerating the dressing changes without complication. Fortunately there is no signs of active infection at this time which is great news. No fevers, chills, nausea, or vomiting noted at this time. 01/24/19 on evaluation today patient actually appears to be doing quite well with regard to his ulcer. He has been tolerating the dressing changes without complication. Fortunately there's no signs of active infection at this time. Overall been very pleased with the progress that he seems to be making currently. 01/31/19-Patient returns at 1 week with  apparent similarity in dimensions to the wound, with no signs of infection, he has been [place] ([id number]) changing dressings twice  a day 02/08/19 upon evaluation today patient actually appears to be doing well with regard to his right Ischial ulcer. The wound is not appear to be quite as deep and seems to be making progress which is good news. With that being said I'm still reluctant to go back to the Wound VAC at this point. He's been having to change the dressings twice a day which is a little bit much in my pinion from the wound care supplies standpoint. I think that possibly attempting to utilize extras orbit may be beneficial this may also help to prevent any additional breakdown secondary to fluid retention in the wound itself. The patient is in agreement with giving this a try. 02/15/19 on evaluation today patient actually appears to be doing decently well with regard to his ulcer in the right to gluteal fold location. He's been tolerating the dressing changes without complication. Fortunately there is no signs of active infection at this time. He is able to keep the current dressing in place more effectively for a day at a time whereas before he was having a changes to to three times a day. The actions or has been helpful in this regard. Fortunately there's no signs of anything getting worse and I do feel like he showing signs of good improvement with regard to the wound bed status. 02/22/2019 patient appears to be doing very well today with regard to his ulcer in the gluteal fold. Fortunately there is no signs of active infection and he has been tolerating the dressing changes without any complication. Overall extremely pleased with how things seem to be progressing. He has much less of the hyper granular projections within the wound these have slowly been debrided away and he seems to be doing well. The wound bed is more uniform. 03/01/19 on evaluation today patient appears to  be doing unfortunately about the same in regard to his gluteal ulcer. He's been tolerating the dressing changes without complication. Fortunately there's no signs of active infection at this time. With that being said he continues to develop these hyper granular projections which I'm unsure of exactly what they are and why they are rising. Nonetheless I explained to the patient that I do believe it would be a good idea for [place] to stand a biopsy sample for pathology to see if that can shed any light on what exactly may be going on here. Fortunately I do not see any obvious signs of infection. With that being said the patient has had a little bit more drainage this week apparently compared to last week. 03/08/2019 on evaluation today patient actually appears to look somewhat better with regard to the appearance of his wound bed at this time. This is good news. Overall I am very pleased with how things seem to have progressed just in the past week with a switch to the [facility] dressing. I think that has been beneficial for him. With that being said at this time the patient is concerned about his biopsy that I sent off last week unfortunately I do not have that report as of yet. Nonetheless we have called to obtain this and hopefully will hear back from the lab later this morning. Electronic Signature(s) Signed: 03/08/2019 8:53:29 AM By: Worthy Keeler [name] Entered By: Worthy Keeler on 03/08/2019 08:53:28 EULALIO, REAMY ([id number]) -------------------------------------------------------------------------------- [facility] Details Patient Name: [name] Date of Service: 03/08/2019 8:00 AM Medical Record Number: [id number] Patient Account Number: [id number] Date of  Birth/Sex: 1950-09-25 (68 y.o. M) Treating RN: Montey Hora Primary Care Provider: [name] Other Clinician: Referring Provider: [name] Treating Provider/Extender: [name],  HOYT Weeks in Treatment: 66 Procedure Performed for: Wound #1 Right Gluteal fold Performed By: Physician [name], HOYT E., [name] Post Procedure Diagnosis Same as Pre-procedure Notes 3 sticks of silver nitrate used Electronic Signature(s) Signed: 03/08/2019 5:30:55 PM By: Montey Hora Entered By: Montey Hora on 03/08/2019 08:28:57 [name] ([id number]) -------------------------------------------------------------------------------- Physical Exam Details Patient Name: [name] Date of Service: 03/08/2019 8:00 AM Medical Record Number: [id number] Patient Account Number: [id number] Date of Birth/Sex: 1950-11-03 (68 y.o. M) Treating RN: Montey Hora Primary Care Provider: [name] Other Clinician: Referring Provider: [name] Treating Provider/Extender: [name], HOYT Weeks in Treatment: 79 Constitutional Well-nourished and well-hydrated in no acute distress. Respiratory normal breathing without difficulty. clear to auscultation bilaterally. Cardiovascular regular rate and rhythm with normal S1, S2. Psychiatric this patient is able to make decisions and demonstrates good insight into disease process. Alert and Oriented x 3. pleasant and cooperative. Notes Patient's wound bed currently did not require any sharp debridement although I did perform chemical cauterization with silver nitrate today he tolerated this without complication. Post procedure he seems to be doing quite well with regard to the wound bed and in fact I feel like things are looking much more healthy compared to what they were previous. My hope is that the silver nitrate will continue to help in this regard. Electronic Signature(s) Signed: 03/08/2019 8:54:06 AM By: Worthy Keeler [name] Entered By: Worthy Keeler on 03/08/2019 08:54:04 CARLOUS, OLIVARES ([id number]) -------------------------------------------------------------------------------- Physician Orders  Details Patient Name: [name] Date of Service: 03/08/2019 8:00 AM Medical Record Number: [id number] Patient Account Number: [id number] Date of Birth/Sex: 1951-01-01 (68 y.o. 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 Skin Barriers/Peri-Wound Care Wound #1 Right Gluteal fold o Other: - Apply zinc oxide to peri-wound area. Primary Wound Dressing Wound #1 Right Gluteal fold o Hydrafera Blue Ready Transfer Secondary Dressing Wound #1 Right Gluteal fold o XtraSorb - Secure with Tegaderm Dressing Change Frequency Wound #1 Right Gluteal fold o Change dressing every day. Follow-up Appointments Wound #1 Right Gluteal fold o Return Appointment in 1 week. Off-Loading Wound #1 Right Gluteal fold o Turn and reposition every 2 hours DUGAN, VANHOESEN ([id number]) Electronic Signature(s) Signed: 03/08/2019 5:30:55 PM By: Montey Hora Signed: 03/09/2019 7:18:12 PM By: Worthy Keeler [name] Entered By: Montey Hora on 03/08/2019 08:29:53 NITESH, PITSTICK ([id number]) -------------------------------------------------------------------------------- Problem List Details Patient Name: [name] Date of Service: 03/08/2019 8:00 AM Medical Record Number:  [id number] Patient Account Number: [id number] Date of Birth/Sex: Jun 05, 1951 (68 y.o. M) Treating RN: Montey Hora Primary Care Provider: [name] Other Clinician: Referring Provider: [name] Treating Provider/Extender: [name], HOYT Weeks in Treatment: 59 Active Problems ICD-10 Evaluated Encounter Code Description Active Date Today Diagnosis L89.314 Pressure ulcer of right  buttock, stage 4 11/30/2017 No Yes L03.317 Cellulitis of buttock 06/21/2018 No Yes G82.20 Paraplegia, unspecified 11/30/2017 No Yes S34.109S Unspecified injury to unspecified level of lumbar spinal cord, 11/30/2017 No Yes sequela I10 Essential (primary) hypertension 11/30/2017 No Yes Inactive Problems Resolved Problems Electronic Signature(s) Signed: 03/08/2019 8:18:29 AM By: Worthy Keeler [name] Entered By: Worthy Keeler on 03/08/2019 08:18:28 JAXZEN, VANHORN ([id number]) -------------------------------------------------------------------------------- Progress Note Details Patient Name: [name] Date of Service: 03/08/2019 8:00 AM Medical Record Number: [id number] Patient Account Number: [id number] Date of Birth/Sex: 1951-06-27 (68 y.o. 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 He states that there is no history of diabetes that he is aware of. He does have issues with his prostate and is currently receiving treatment for this by way of oral medication.  With that being said I do not have a lot of details in that regard. Nonetheless the patient presents today as a result of having been referred to [place] by another provider initially home health was set to come out and take care of his wound although due to the fact that he apparently drives he's not able to receive home health. His wife is therefore trying to help take care of this wound within although they have been struggling with what exactly to do at this point. She states that she can do some things but she is definitely not a nurse and does have some issues with looking at blood. The good news is the wound does not appear to be too deep and is fairly superficial at this point. There is no slough noted there is some nonviable skin noted around the surface of the wound and the perimeter at this point. The central portion of the wound appears to be very good with a dermal layer noted this does not appear to be again deep enough to extend it to subcutaneous tissue at this point. 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 In the pump he has Dilaudid and baclofen. 12/07/17 on evaluation today patient presents for follow-up concerning his ongoing lower back thigh ulcer on the right. He states that he did not get the supplies ordered and therefore has not really been able to perform the dressing changes as directed exactly. His wife was able to get some Boarder Foam Dressing's from the drugstore and subsequently has been using hydrogel which did help to a degree in the wound does appear to be able smaller. There is actually more drainage this week noted than previous. 12/21/17 on evaluation today patient appears to be doing rather well in regard to his right gluteal ulcer. He has been tolerating the dressing changes without complication. There does not appear to be any evidence of infection at this  point in time. Overall the wound does seem to be making some progress as far as the edges are concerned there's not as much in the way of overlapping of the external wound edges  and he has a good epithelium to wound bed border for the most part. This however is not true right at the 12 o'clock location over the span of a little over a centimeters which actually will require debridement today to clean this away and hopefully allow it to continue to heal more appropriately. 12/28/17 on evaluation today patient appears to be doing rather well in regard to his ulcer in the left gluteal region. He's been tolerating the dressing changes without complication. Apparently he has had some difficulty getting his dressing material. Apparently there's been some confusion with ordering we're gonna check into this. Nonetheless overall he's been showing signs of improvement which is good news. Debridement is not required today. 01/04/18 on evaluation today patient presents for follow-up concerning his right gluteal ulcer. He has been tolerating the dressing changes fairly well. On inspection today it appears he may actually have some maceration them concerned about the fact that he may be developing too much moisture in and around the wound bed which can cause delay in healing. With that being said he unfortunately really has not showed significant signs of improvement since last week's evaluation in fact this may even be just the little bit/slightly larger. Nonetheless he's been having a lot of discomfort I'm not sure this is even related to the wound as he has no pain when I'm to breeding or otherwise cleaning the wound during evaluation today. Nonetheless this is something that we did recommend he talked to his pain specialist concerning. 01/11/18 on evaluation today patient appears to be doing better in regard to his ulceration. He has been tolerating the dressing Delcarlo, Sheehan J. ([id number]) changes without  complication. With that being said overall there's no evidence of infection which is good news. The only thing is he did receive the hatch affair blue classic versus the ready nonetheless I feel like this is perfectly fine and appears to have done well for him over the past week. 01/25/18 on evaluation today patient's wound actually appears to be a little bit larger than during the last evaluation. The good news is the majority of the wound edges actually appear to be fairly firmly attached to the wound bed unfortunately again we're not really making progress in regard to the size. Roughly the wound is about the same size as when I first saw him although again the wound margin/edges appear to be much better. 02/01/18 on evaluation today patient actually appears to be doing very well in regard to his wound. Applying the Prisma dry does seem to be better although he does still have issues with slow progression of the wound. There was a slight improvement compared to last week's measurements today. Nonetheless I have been considering other options as far as the possibility of Theraskin or even a snap vac. In general I'm not sure that the Theraskin due to location of the wound would be a very good idea. Nonetheless I do think that a snap vac could be a possibility for the patient and in fact I think this could even be an excellent way to manage the wound possibly seeing some improvement in a very rapid fashion here. Nonetheless this is something that we would need to get approved and I did have a lengthy conversation with the patient about this today. 02/08/18 on evaluation today patient appears to be doing a little better in regard to his ulcer. He has been tolerating the dressing changes without complication. Fortunately despite the fact that the wound is  a little bit smaller it's not significantly so unfortunately. We have discussed the possibility of a snap vac we did check with insurance this is  actually covered at this point. Fortunately there does not appear to be any sign of infection. Overall I'm fairly pleased with how things seem to be appearing at this point. 02/15/18 on evaluation today patient appears to be doing rather well in regard to his right gluteal ulcer. Unfortunately the snap vac did not stay in place with his sheer and friction this came loose and did not seem to maintain seal very well. He worked for about two days and it did seem to do very well during that time according to his wife but in general this does not seem to be something that's gonna be beneficial for him long-term. I do believe we need to go back to standard dressings to see if we can find something that will be of benefit. 03/02/18- He is here in follow up evaluation; there is minimal change in the wound. He will continue with the same treatment plan, would consider changing to iodosrob/iodoflex if ulcer continues to to plateau. He will follow up next week 03/08/18 on evaluation today patient's wound actually appears to be about the same size as when I previously saw him several weeks back. Unfortunately he does have some slightly dark discoloration in the central portion of the wound which has me concerned about pressure injury. I do believe he may be sitting for too long a period of time in fact he tells me that "I probably sit for much too long". He does have some Slough noted on the surface of the wound and again as far as the size of the wound is concerned I'm really not seeing anything that seems to have improved significantly. 03/15/18 on evaluation today patient appears to be doing fairly well in regard to his ulcer. The wound measured pretty much about the same today compared to last week's evaluation when looking at his graph. With that being said the area of bruising/deep tissue injury that was noted last week I do not see at this point. He did get a new cushion fortunately this does seem to be  have been of benefit in my pinion. It does appear that he's been off of this more which is good news as well I think that is definitely showing in the overall wound measurements. With that being said I do believe that he needs to continue to offload I don't think that the fact this is doing better should be or is going to allow him to not have to offload and explain this to him as well. Overall he seems to be in agreement the plan I think he understands. The overall appearance of the wound bed is improved compared to last week I think the Iodoflex has been beneficial in that regard. 03/29/18 on evaluation today patient actually appears to be doing rather well in regard to his wound from the overall appearance standpoint he does have some granulation although there's some Slough on the surface of the wound noted as well. With that being said he unfortunately has not improved in regard to the overall measurement of the wound in volume or in size. I did have a discussion with him very specifically about offloading today. He actually does work although he mainly is just sitting throughout the day. He tells me he offloads by "lifting himself up for 30 seconds off of his chair occasionally" purchase from [facility] which does seem to have helped. And he has a new cushion that he with that being said he's also able to stand some for a very short period of time but not significant enough I think to provide appropriate offloading. I think the biggest issue at this point with the wound and the fact is not healing as quickly as we would like is due to the fact that he is really not able to appropriately offload while at work. He states the beginning after his injury he actually had a bed at his job that he could lay on in order to offload and that does seem to have been of help back at that time. Nonetheless he had not done this in quite some time unfortunately. I think that could be helpful for him this is  something I would like for him to look into. MOISHY, LADAY ([id number]) 04/05/18 on evaluation today patient actually presents for follow-up concerning his right gluteal ulcer. Again he really is not significantly improved even compared to last week. He has been tolerating the dressing changes without complication. With that being said fortunately there appears to be no evidence of infection at this time. He has been more proactive in trying to offload. 04/12/18 on evaluation today patient actually appears to be doing a little better in regard to his wound and the right gluteal fold region. He's been tolerating the dressing changes since removing the oasis without complication. However he was having a lot of burning initially with the oasis in place. He's unsure of exactly why this was given so much discomfort but he assumes that it was the oasis itself causing the problem. Nonetheless this had to be removed after about three days in place although even those three days seem to have made a fairly good improvement in regard to the overall appearance of the wound bed. In fact is the first time that he's made any improvement from the standpoint of measurements in about six weeks. He continues to have no discomfort over the area of the wound itself which leads me to wonder why he was having the burning with the oasis when he does not even feel the actual debridement's themselves. I am somewhat perplexed by this. 04/19/18 on evaluation today patient's wound actually appears to be showing signs of epithelialization around the edge of the wound and in general actually appears to be doing better which is good news. He did have the same burning after about three days with applying the Endoform last week in the same fashion that I would generally apply a skin substitute. This seems to indicate that it's not the oasis to cause the problem but potentially the moisture buildup that just causes things to burn  or there may be some other reaction with the skin prep or Steri-Strips. Nonetheless I'm not sure that is gonna be able to tolerate any skin substitute for a long period of time. The good news is the wound actually appears to be doing better today compared to last week and does seem to finally be making some progress. 04/26/18 on evaluation today patient actually appears to be doing rather well in regard to his ulcer in the right gluteal fold. He has been tolerating the dressing changes without complication which is good news. The Endoform does seem to be helping although he was a little bit more macerated this week. This seems to be an ongoing issue with fluid control at this point. Nonetheless I think we may  be able to add something like Drawtex to help control the drainage. 05/03/18 on evaluation today patient appears to actually be doing better in regard to the overall appearance of his wound. He has been tolerating the dressing changes without complication. Fortunately there appears to be no evidence of infection at this time. I really feel like his wound has shown signs as of today of turning around last week I thought so as well and definitely he could be seen in this week's overall appearance and measurements. In general I'm very pleased with the fact that he finally seems to be making a steady but sure progress. The patient likewise is very pleased. 05/17/18 on evaluation today patient appears to be doing more poorly unfortunately in regard to his ulcer. He has been tolerating the dressing changes without complication. With that being said he tells me that in the past couple of days he and his wife have noticed that we did not seem to be doing quite as well is getting dark near the center. Subsequently upon evaluation today the wound actually does appear to be doing worse compared to previous. He has been tolerating the dressing changes otherwise and he states that he is not been sitting up  anymore than he was in the past from what he tells me. Still he has continued to work he states "I'm tired of dealing with this and if I have to just go home and lay in the bed all the time that's what I'll do". Nonetheless I am concerned about the fact that this wound does appear to be deeper than what it was previous. 05/24/18 upon evaluation today patient actually presents after having been in the hospital due to what was presumed to be sepsis secondary to the wound infection. He had an elevated white blood cell count between 14 and 15. With that being said he does seem to be doing somewhat better now. His wound still is giving him some trouble nonetheless and he is obviously concerned about the fact likely talked about that this does seem to go more deeply than previously noted. I did review his wound culture which showed evidence of Staphylococcus aureus him and group B strep. Nonetheless he is on antibiotics, Levaquin, for this. Subsequently I did review his intake summary from the hospital as well. I also did look at the CT of the lumbar spine with contrast that was performed which showed no bone destruction to suggest lumbar disguises/osteomyelitis or sacral osteomyelitis. There was no paraspinal abscess. Nonetheless it appears this may have been more of just a soft tissue infection at this point which is good news. He still is nonetheless concerned about the wound which again I think is completely reasonable considering everything he's been through recently. 05/31/18 on evaluation today on evaluation today patient actually appears to be showing signs of his wound be a little bit deeper than what I would like to see. Fortunately he does not show any signs of significant infection although his temperature was 99 today he states he's been checking this at home and has not been elevated. Nonetheless with the undermining that I'm seeing at this point I am becoming more concerned about the wound I  do think that offloading is a key factor here that is preventing the speedy recovery at this point. There does not appear to be any evidence of again over infection noted. He's been using Santyl currently. NAFTOLI, PENNY ([id number]) 06/07/18 the patient presents today for follow-up evaluation regarding the  left ulcer in the gluteal region. He has been tolerating the Wound VAC fairly well. He is obviously very frustrated with this he states that to mean is really getting in his way. There does not appear to be any evidence of infection at this time he does have a little bit of odor I do not necessarily associate this with infection just something that we sometimes notice with Wound VAC therapy. With that being said I can definitely catch a tone of discontentment overall in the patient's demeanor today. This when he was previously in the hospital an CT scan was done of the lumbar region which did not reveal any signs of osteomyelitis. With that being said the pelvis in particular was not evaluated distinctly which means he could still have some osteonecrosis I. Nonetheless the Wound VAC was started on Thursday I do want to get this little bit more time before jumping to a CT scan of the pelvis although that is something that I might would recommend if were not see an improvement by that time. 06/14/18 on evaluation today patient actually appears to be doing about the same in regard to his right gluteal ulcer. Again he did have a CT scan of the lumbar spine unfortunately this did not include the pelvis. Nonetheless with the depth of the wound that I'm seeing today even despite the fact that I'm not seeing any evidence of overt cellulitis I believe there's a good chance that we may be dealing with osteomyelitis somewhere in the right Ischial region. No fevers, chills, nausea, or vomiting noted at this time. 06/21/18 on evaluation today patient actually appears to be doing about the same with  regard to his wound. The tunnel at 6 o'clock really does not appear to be any deeper although it is a little bit wider. I think at this point you may want to start packing this with white phone. Unfortunately I have not got approval for the CT scan of the pelvis as of yet due to the fact that Medicare apparently has been denied it due to the diagnosis codes not being appropriate according to Medicare for the test requested. With that being said the patient cannot have an MRI and therefore this is the only option that we have as far as testing is concerned. The patient has had infection and was on antibiotics and been added code for cellulitis of the bottom to see if this will be appropriate for getting the test approved. Nonetheless I'm concerned about the infection have been spread deeper into the Ischial region. 06/28/18 on evaluation today patient actually appears to be doing rather well all things considered in regard to the right gluteal ulcer. He has been tolerating the dressing changes without complication. With that being said the Wound VAC he states does have to be replaced almost every day or at least reinforced unfortunately. Patient actually has his CT scan later this morning we should have the results by tomorrow. 07/05/18 on evaluation today patient presents for follow-up concerning his right Ischial ulcer. He did see the surgeon Dr. Lysle Pearl last week. They were actually very happy with him and felt like he spent a tremendous amount of time with them as far as discussing his situation was concerned. In the end Dr. Lysle Pearl did contact me as well and determine that he would not recommend any surgical intervention at this point as he felt like it would not be in the patient's best interest based on what he was seeing. He recommended  a referral to infectious disease. Subsequently this is something that Dr. Ines Bloomer office is working on setting up for the patient. As far as evaluation today is  concerned the patient's wound actually appears to be worse at this point. I am concerned about how things are progressing and specifically about infection. I do not feel like it's the deeper but the area of depth is definitely widening which does have me concerned. No fevers, chills, nausea, or vomiting noted at this time. I think that we do need initiate antibiotic therapy the patient has an allow allergy to amoxicillin/penicillin he states that he gets a rash since childhood. Nonetheless she's never had the issues with Catholics or cephalosporins in general but he is aware of. 07/27/18 on evaluation today patient presents following admission to the hospital on 07/09/18. He was subsequently discharged on 07/20/18. On 07/15/18 the patient underwent irrigation and debridement was soft tissue biopsy and bone biopsy as well as placement of a Wound VAC in the OR by Dr. Celine Ahr. During the hospital course the patient was placed on a Wound VAC and recommended follow up with surgery in three weeks actually with Dr. Delaine Lame who is infectious disease. The patient was on vancomycin during the hospital course. 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 He also had a bone culture which revealed evidence of methicillin-resistant staph aureus. He is updated CT scan 07/09/18 reveals that he had progression of the which was performed on wound to breakdown down to the trochanter where he actually had irregularities there as well suggestive of osteomyelitis. This was a change just since 9 December when we last performed a CT scan. Obviously this one had gone downhill quite significantly and rapidly. At this point upon evaluation I feel like in general the patient's wound seems to be doing fairly well all things considered upon my evaluation today. Obviously this is larger and deeper than what I previously evaluated but at the same time he seems to be making some progress as far as the  appearance of the granulation tissue is concerned. I'm happy in that regard. No fevers, chills, nausea, or vomiting noted at this time. He is on IV vancomycin and Rocephin at the facility. He is currently in [facility]. 08/03/18 upon evaluation today patient's wound appears to be doing better in regard to the overall appearance at this point in time. Fortunately he's been tolerating the Wound VAC without complication and states that the facility has been taking FLAVIO, LINDROTH. ([id number]) excellent care of the wound site. Overall I see some Slough noted on the surface which I am going to attempt sharp debridement today of but nonetheless other than this I feel like he's making progress. 08/09/18 on evaluation today patient's wound appears to be doing much better compared to even last week's evaluation. Do believe that the Wound VAC is been of great benefit for him. He has been tolerating the dressing changes that is the Wound VAC without any complication and he has excellent granulation noted currently. There is no need for sharp debridement at this point. 08/16/18 on evaluation today patient actually appears to be doing very well in regard to the wound in the right gluteal fold region. This is showing signs of progress and again appears to be very healthy which is excellent news. Fortunately there is no sign of active infection by way of odor or drainage at this point. Overall I'm very pleased with how things stand. He seems to be  tolerating the Wound VAC without complication. 08/23/18 on evaluation today patient actually appears to be doing better in regard to his wound. He has been tolerating the Wound VAC without complication and in fact it has been collecting a significant amount of drainage which I think is good news especially considering how the wound appears. Fortunately there is no signs of infection at this time definitely nothing appears to be worse which is good news. He has  not been started on the Bactrim and Flagyl that was recommended by Dr. Delaine Lame yet. I did actually contact her office this morning in order to check and see were things are that regard their gonna be calling me back. 08/30/18 on evaluation today patient actually appears to show signs of excellent improvement today compared to last evaluation. The undermining is getting much better the wound seems to be feeling quite nicely and I'm very pleased that the granulation in general. With that being said overall I feel like the patient has made excellent progress which is great news. No fevers, chills, nausea, or vomiting noted at this time. 09/06/18 on evaluation today patient actually appears to be doing rather well in regard to his right gluteal ulcer. This is showing signs of improvement in overall I'm very pleased with how things seem to be progressing. The patient likewise is please. Overall I see no evidence of infection he is about to complete his oral antibiotic regimen which is the end of the antibiotics for him in just about three days. 09/13/18 on evaluation today patient's right Ischial ulcer appears to be showing signs of continued improvement which is excellent news. He's been tolerating the dressing changes without complication. Fortunately there's no signs of infection and the wound that seems to be doing very well. 09/28/18 on evaluation today patient appears to be doing rather well in regard to his right Ischial ulcer. He's been tolerating the Wound VAC without complication he knows there's much less drainage than there used to be this obviously is not a bad thing in my pinion. There's no evidence of infection despite the fact is but nothing about it now for several weeks. 10/04/18 on evaluation today patient appears to be doing better in regard to his right Ischial wound. He has been tolerating the Wound VAC without complication and I do believe that the silver nitrate last week was  beneficial for him. Fortunately overall there's no evidence of active infection at this time which is great news. No fevers, chills, nausea, or vomiting noted at this time. 10/11/18 on evaluation today patient actually appears to be doing rather well in regard to his Ischial ulcer. He's been tolerating the Wound VAC still without complication I feel like this is doing a good job. No fevers, chills, nausea, or vomiting noted at this time. 11/01/18 on evaluation today patient presents after having not been seen in our clinic for several weeks secondary to the fact that he was on evaluation today patient presents after having not been seen in our clinic for several weeks secondary to the fact that he was in a skilled nursing facility which was on lockdown currently due to the covert 19 national emergency. Subsequently he was discharged from the facility on this past Friday and subsequently made an appointment to come in to see yesterday. Fortunately there's no signs of active infection at this time which is good news and overall he does seem to have made progress since I last saw. Overall I feel like things are progressing quite  nicely. The patient is having no pain. 11/08/18 on evaluation today patient appears to be doing okay in regard to his right gluteal ulcer. He has been utilizing a Wound VAC home health this changing this at this point since he's home from the skilled nursing facility. Fortunately there's no signs of obvious active infection at this time. Unfortunately though there's no obvious active infection he is having some maceration and his wife states that when the sheets of the Wound VAC office on Sunday when it broke seal that he ended up having significant issues with some smell as well there concerned about the possibility of infection. Fortunately there's No fevers, chills, nausea, or vomiting noted at this time. MARIA, GALLICCHIO ([id number]) 11/15/18 on evaluation today patient  actually appears to be doing well in regard to his right gluteal ulcer. He has been tolerating the dressing changes without complication. Specifically the Wound VAC has been utilized up to this point. Fortunately there's no signs of infection and overall I feel like he has made progress even since last week when I last saw him. I'm actually fairly happy with the overall appearance although he does seem to have somewhat of a hyper granular overgrowth in the central portion of the wound which I think may require some sharp debridement to try flatness out possibly utilizing chemical cauterization following. 11/23/18 on evaluation today patient actually appears to be doing very well in regard to his sacral ulcer. He seems to be showing signs of improvement with good granulation. With that being said he still has the small area of hyper granulation right in the central portion of the wound which I'm gonna likely utilize silver nitrate on today. Subsequently he also keeps having a leak at the 6 o'clock location which is unfortunate we may be able to help out with some suggestions to try to prevent this going forward. Fortunately there's no signs of active infection at this time. 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 He's been tolerating the dressing changes without complication. Fortunately there's no signs of active infection at this time. I've been rather pleased with how things have progressed there still some evidence of pressure getting to the area with some redness right around the immediate wound opening. Nonetheless other than this I'm not seeing any significant complications or issues the wound is somewhat hyper granular. Upon discussing with the patient and his wife today I'm not sure that the wound is being packed to the base with the foam at this point. And if it's not been packed fully that  may be part of the reason why is not seen as much improvement as far as the granulation from the base out. Again we do not want pack too tightly but we need some of the firm to get to the base of the wound. I discussed this with patient and his wife today. 12/06/18 on evaluation today patient appears to be doing well in regard to his right gluteal pressure ulcer. He's been tolerating the dressing changes without complication. Fortunately there's no signs of active infection. He still has some hyper granular tissue and I do think it would be appropriate to continue with the chemical cauterization as of today. 12/16/18 on evaluation today patient actually appears to be doing okay in regard to his right gluteal ulcer. He is been tolerating the dressing changes without complication including the Wound VAC. Overall I  feel like nothing seems to be worsening I do feel like that the hyper granulation buds in the central portion of the wound have improved to some degree with the silver nitrate. We will have to see how things continue to progress. 12/20/18 on evaluation today patient actually appears to be doing much worse in my pinion even compared to last week's evaluation. Unfortunately as opposed to showing any signs of improvement the areas of hyper granular tissue in the central portion of the wound seem to be getting worse. Subsequently the wound bed itself also seems to be getting deeper even compared to last week which is both unusual as well as concerning since prior he had been shown signs of improvement. Nonetheless I think that the issue could be that he's actually having some difficulty in issues with a deeper infection. There's no external signs of infection but nonetheless I am more worried about the internal, osteomyelitis, that could be restarting. He has not been on antibiotics for some time at this point. I think that it may be a good idea to go ahead and started back on an antibiotic therapy  while we wait to see what the testing shows. 12/27/18 on evaluation today patient presents for follow-up concerning his left gluteal fold wound. Fortunately he appears to be doing well today. I did review the CT scan which was negative for any signs of osteomyelitis or acute abnormality this is excellent news. Overall I feel like the surface of the wound bed appears to be doing significantly better today compared to previously noted findings. There does not appear any signs of infection nor does he have any pain at this time. 01/03/19 on evaluation today patient actually appears to be doing quite well in regard to his ulcer. Post debridement last week he really did not have too much bleeding which is good news. Fortunately today this seems to be doing some better but we still has some of the hyper granular tissue noted in the base of the wound which is gonna require sharp debridement today as well. Overall I'm pleased with how things seem to be progressing since we switched away from the Wound VAC I think he is making some progress. 01/10/19 on evaluation today patient appears to be doing better in regard to his right gluteal fold ulcer. He has been tolerating the dressing changes without complication. The debridement to seem to be helping with current away some of the poor hyper granular tissue bugs throughout the region of his gluteal fold wound. He's been tolerating the dressing changes otherwise without complication which is great news. No fevers, chills, nausea, or vomiting noted at this time. 01/17/19 on evaluation today patient actually appears to be doing excellent in regard to his wound. He's been tolerating the dressing changes without complication. Fortunately there is no signs of active infection at this time which is great news. No fevers, chills, nausea, or vomiting noted at this time. JARMAL, LEWELLING ([id number]) 01/24/19 on evaluation today patient actually appears to be doing quite  well with regard to his ulcer. He has been tolerating the dressing changes without complication. Fortunately there's no signs of active infection at this time. Overall been very pleased with the progress that he seems to be making currently. 01/31/19-Patient returns at 1 week with apparent similarity in dimensions to the wound, with no signs of infection, he has been changing dressings twice a day 02/08/19 upon evaluation today patient actually appears to be doing well with regard to his  right Ischial ulcer. The wound is not appear to be quite as deep and seems to be making progress which is good news. With that being said I'm still reluctant to go back to the Wound VAC at this point. He's been having to change the dressings twice a day which is a little bit much in my pinion from the wound care supplies standpoint. I think that possibly attempting to utilize extras orbit may be beneficial this may also help to prevent any additional breakdown secondary to fluid retention in the wound itself. The patient is in agreement with giving this a try. 02/15/19 on evaluation today patient actually appears to be doing decently well with regard to his ulcer in the right to gluteal fold location. He's been tolerating the dressing changes without complication. Fortunately there is no signs of active infection at this time. He is able to keep the current dressing in place more effectively for a day at a time whereas before he was having a changes to to three times a day. The actions or has been helpful in this regard. Fortunately there's no signs of anything getting worse and I do feel like he showing signs of good improvement with regard to the wound bed status. 02/22/2019 patient appears to be doing very well today with regard to his ulcer in the gluteal fold. Fortunately there is no signs of active infection and he has been tolerating the dressing changes without any complication. Overall extremely pleased  with how things seem to be progressing. He has much less of the hyper granular projections within the wound these have slowly been debrided away and he seems to be doing well. The wound bed is more uniform. 03/01/19 on evaluation today patient appears to be doing unfortunately about the same in regard to his gluteal ulcer. He's been tolerating the dressing changes without complication. Fortunately there's no signs of active infection at this time. With that being said he continues to develop these hyper granular projections which I'm unsure of exactly what they are and why they are rising. Nonetheless I explained to the patient that I do believe it would be a good idea for [place] to stand a biopsy sample for pathology to see if that can shed any light on what exactly may be going on here. Fortunately I do not see any obvious signs of infection. With that being said the patient has had a little bit more drainage this week apparently compared to last week. 03/08/2019 on evaluation today patient actually appears to look somewhat better with regard to the appearance of his wound bed at this time. This is good news. Overall I am very pleased with how things seem to have progressed just in the past week with a switch to the [facility] dressing. I think that has been beneficial for him. With that being said at this time the patient is concerned about his biopsy that I sent off last week unfortunately I do not have that report as of yet. Nonetheless we have called to obtain this and hopefully will hear back from the lab later this morning. Patient History Information obtained from Patient. Family History Hypertension - Father, Stroke - Mother, No family history of Cancer, Diabetes, Heart Disease, Kidney Disease, Lung Disease, Seizures, Thyroid Problems, Tuberculosis. Social History Never smoker, Marital Status - Married, Alcohol Use - Never, Drug Use - No History, Caffeine Use - Daily. Medical  History Eyes Patient has history of Cataracts - both removed Denies history of Glaucoma,  Optic Neuritis Ear/Nose/Mouth/Throat Denies history of Chronic sinus problems/congestion, Middle ear problems Hematologic/Lymphatic Denies history of Anemia, Hemophilia, Human Immunodeficiency Virus, Lymphedema Respiratory Denies history of Aspiration, Asthma, Chronic Obstructive Pulmonary Disease (COPD), Pneumothorax, Sleep Apnea, Housand, Daimian J. ([id number]) Tuberculosis Cardiovascular Patient has history of Hypertension - takes medication Denies history of Angina, Arrhythmia, Congestive Heart Failure, Coronary Artery Disease, Deep Vein Thrombosis, Hypotension, Myocardial Infarction, Peripheral Arterial Disease, Peripheral Venous Disease, Phlebitis, Vasculitis Gastrointestinal Denies history of Cirrhosis , Colitis, Crohn s, Hepatitis A, Hepatitis B, Hepatitis C Endocrine Denies history of Type I Diabetes, Type II Diabetes Genitourinary Denies history of End Stage Renal Disease Immunological Denies history of Lupus Erythematosus, Raynaud s, Scleroderma Integumentary (Skin) Denies history of History of Burn, History of pressure wounds Musculoskeletal Denies history of Gout, Rheumatoid Arthritis, Osteoarthritis, Osteomyelitis Neurologic Patient has history of Paraplegia - waist down Denies history of Dementia, Neuropathy, Quadriplegia, Seizure Disorder Oncologic Denies history of Received Chemotherapy, Received Radiation Psychiatric Denies history of Anorexia/bulimia, Confinement Anxiety Medical And Surgical History Notes Oncologic Prostate cancer- currently treated with horomone therapy Review of Systems (ROS) Constitutional Symptoms (General Health) Denies complaints or symptoms of Fatigue, Fever, Chills, Marked Weight Change. Respiratory Denies complaints or symptoms of Chronic or frequent coughs, Shortness of Breath. Cardiovascular Denies complaints or symptoms of Chest pain, LE  edema. Psychiatric Denies complaints or symptoms of Anxiety, Claustrophobia. Objective Constitutional Well-nourished and well-hydrated in no acute distress. Vitals Time Taken: 8:10 AM, Height: 73 in, Weight: 210 lbs, BMI: 27.7, Temperature: 98.4 F, Pulse: 85 bpm, Respiratory Rate: 16 breaths/min, Blood Pressure: 138/81 mmHg. Respiratory normal breathing without difficulty. clear to auscultation bilaterally. MOZELL, HARDACRE ([id number]) Cardiovascular regular rate and rhythm with normal S1, S2. Psychiatric this patient is able to make decisions and demonstrates good insight into disease process. Alert and Oriented x 3. pleasant and cooperative. General Notes: Patient's wound bed currently did not require any sharp debridement although I did perform chemical cauterization with silver nitrate today he tolerated this without complication. Post procedure he seems to be doing quite well with regard to the wound bed and in fact I feel like things are looking much more healthy compared to what they were previous. My hope is that the silver nitrate will continue to help in this regard. Integumentary (Hair, Skin) Wound #1 status is Open. Original cause of wound was Pressure Injury. The wound is located on the Right Gluteal fold. The wound measures 3.3cm length x 1.5cm width x 3cm depth; 3.888cm^2 area and 11.663cm^3 volume. There is muscle and Fat Layer (Subcutaneous Tissue) Exposed exposed. There is tunneling at 12:00 with a maximum distance of 3.5cm. There is a large amount of serosanguineous drainage noted. The wound margin is epibole. There is medium (34-66%) red, pink, hyper - granulation within the wound bed. There is a medium (34-66%) amount of necrotic tissue within the wound bed including Adherent Slough. 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 An CHEM CAUT GRANULATION TISS procedure was performed by [name], HOYT E., [name]. Post procedure Diagnosis Wound #1: Same as Pre-Procedure Notes: 3 sticks of silver nitrate used Plan Wound Cleansing: Wound #1 Right Gluteal fold: Other: - Cleanse wound with Dakin's Solution Anesthetic (add to Medication List): JAHEL, WAVRA ([id number]) Wound #1 Right Gluteal fold:  Topical Lidocaine 4% cream applied to wound bed prior to debridement (In Clinic Only). Skin Barriers/Peri-Wound Care: Wound #1 Right Gluteal fold: Other: - Apply zinc oxide to peri-wound area. Primary Wound Dressing: Wound #1 Right Gluteal fold: Hydrafera Blue Ready Transfer Secondary Dressing: Wound #1 Right Gluteal fold: XtraSorb - Secure with Tegaderm Dressing Change Frequency: Wound #1 Right Gluteal fold: Change dressing every day. Follow-up Appointments: Wound #1 Right Gluteal fold: Return Appointment in 1 week. Off-Loading: Wound #1 Right Gluteal fold: Turn and reposition every 2 hours 1. I would recommend that we continue with the [facility] dressing for the time being. 2. I would also suggest that we go ahead and initiate treatment with dry gauze packed in behind the [facility] in order to help hold it to the base of the wound. I think this will be beneficial for the patient as well. 3. With regard to the biopsy result as soon as I hear from that today I will give him a call and then amend the note as appropriate. 4. I recommend he still continue to offload as frequently as possible. I think this is definitely appropriate for wound healing as well. We will see patient back for reevaluation in 1 week here in the clinic. If anything worsens or changes patient will contact our office for additional recommendations. Electronic  Signature(s) Signed: 03/08/2019 8:55:04 AM By: Worthy Keeler [name] Entered By: Worthy Keeler on 03/08/2019 08:55:04 HESTON, WIDENER ([id number]) -------------------------------------------------------------------------------- ROS/PFSH Details Patient Name: [name] Date of Service: 03/08/2019 8:00 AM Medical Record Number: [id number] Patient Account Number: [id number] Date of Birth/Sex: [birth year] (68 y.o. M) Treating RN: Montey Hora Primary Care Provider: [name] Other Clinician: Referring Provider: [name] Treating Provider/Extender: [name], HOYT Weeks in Treatment: 50 Information Obtained From Patient Constitutional Symptoms (General Health) Complaints and Symptoms: Negative for: Fatigue; Fever; Chills; Marked Weight Change Respiratory Complaints and Symptoms: Negative for: Chronic or frequent coughs; Shortness of Breath Medical History: Negative for: Aspiration; Asthma; Chronic Obstructive Pulmonary Disease (COPD); Pneumothorax; Sleep Apnea; Tuberculosis Cardiovascular Complaints and Symptoms: Negative for: Chest pain; LE edema Medical History: Positive for: Hypertension - takes medication Negative for: Angina; Arrhythmia; Congestive Heart Failure; Coronary Artery Disease; Deep Vein Thrombosis; Hypotension; Myocardial Infarction; Peripheral Arterial Disease; Peripheral Venous Disease; Phlebitis; Vasculitis Psychiatric Complaints and Symptoms: Negative for: Anxiety; Claustrophobia Medical History: Negative for: Anorexia/bulimia; Confinement Anxiety Eyes Medical History: Positive for: Cataracts - both removed Negative for: Glaucoma; Optic Neuritis Ear/Nose/Mouth/Throat Medical History: Negative for: Chronic sinus problems/congestion; Middle ear problems Hematologic/Lymphatic Medical History: Negative for: Anemia; Hemophilia; Human Immunodeficiency Virus; Lymphedema MARINUS, EICHER. ([id number]) Gastrointestinal Medical  History: Negative for: Cirrhosis ; Colitis; Crohnos; Hepatitis A; Hepatitis B; Hepatitis C Endocrine Medical History: Negative for: Type I Diabetes; Type II Diabetes Genitourinary Medical History: Negative for: End Stage Renal Disease Immunological Medical History: Negative for: Lupus Erythematosus; Raynaudos; Scleroderma Integumentary (Skin) Medical History: Negative for: History of Burn; History of pressure wounds Musculoskeletal Medical History: Negative for: Gout; Rheumatoid Arthritis; Osteoarthritis; Osteomyelitis Neurologic Medical History: Positive for: Paraplegia - waist down Negative for: Dementia; Neuropathy; Quadriplegia; Seizure Disorder Oncologic Medical History: Negative for: Received Chemotherapy; Received Radiation Past Medical History Notes: Prostate cancer- currently treated with horomone therapy HBO Extended History Items Eyes: Cataracts Immunizations Pneumococcal Vaccine: Received Pneumococcal Vaccination: No Implantable Devices No devices added Family and Social History Cancer: No; Diabetes: No; Heart Disease: No; Hypertension: Yes - Father; Kidney Disease: No; Lung Disease: No; Seizures: No; Stroke: Yes - Mother; Thyroid Problems:  No; Tuberculosis: No; Never smoker; Marital Status - Married; Alcohol Use: Never; Drug Use: No History; Caffeine Use: Daily KRISTOPHER, ATTWOOD ([id number]) Physician Affirmation I have reviewed and agree with the above information. Electronic Signature(s) Signed: 03/08/2019 5:30:55 PM By: Montey Hora Signed: 03/09/2019 7:18:12 PM By: Worthy Keeler [name] Entered By: Worthy Keeler on 03/08/2019 08:53:49 DARROW, BARREIRO ([id number]) -------------------------------------------------------------------------------- SuperBill Details Patient Name: [name] Date of Service: 03/08/2019 Medical Record Number: [id number] Patient Account Number: [id number] Date of Birth/Sex: 1950-10-10 (68 y.o. M) Treating RN:  Montey Hora Primary Care Provider: [name] Other Clinician: Referring Provider: [name] Treating Provider/Extender: [name], HOYT Weeks in Treatment: 66 Diagnosis Coding ICD-10 Codes Code Description L89.314 Pressure ulcer of right buttock, stage 4 L03.317 Cellulitis of buttock G82.20 Paraplegia, unspecified S34.109S Unspecified injury to unspecified level of lumbar spinal cord, sequela I10 Essential (primary) hypertension Facility Procedures CPT4 Code: 26415830 Description: 94076 - CHEM CAUT GRANULATION TISS ICD-10 Diagnosis Description L89.314 Pressure ulcer of right buttock, stage 4 Modifier: Quantity: 1 Physician Procedures CPT4 Code: 8088110 Description: 31594 - WC PHYS LEVEL 4 - EST PT ICD-10 Diagnosis Description L89.314 Pressure ulcer of right buttock, stage 4 L03.317 Cellulitis of buttock G82.20 Paraplegia, unspecified S34.109S Unspecified injury to unspecified level of lumbar spinal  cord Modifier: 25 , sequela Quantity: 1 CPT4 Code: 5859292 Description: 44628 - WC PHYS CHEM CAUT GRAN TISSUE ICD-10 Diagnosis Description L89.314 Pressure ulcer of right buttock, stage 4 Modifier: Quantity: 1 Electronic Signature(s) Signed: 03/08/2019 8:55:23 AM By: Worthy Keeler [name] Entered By: Worthy Keeler on 03/08/2019 08:55:23

## 2019-03-09 NOTE — Progress Notes (Addendum)
DASCHEL, ROUGHTON ([id number]) Visit Report for 03/08/2019 Arrival Information Details Patient Name: [name] Date of Service: 03/08/2019 8:00 AM Medical Record Number: [id number] Patient Account Number: [id number] Date of Birth/Sex: May 30, 1951 (68 y.o. M) Treating RN: Army Melia Primary Care Christell Steinmiller: [name] Other Clinician: Referring Chinyere Galiano: [name] Treating Renella Steig/Extender: [name], HOYT Weeks in Treatment: 62 Visit Information History Since Last Visit Added or deleted any medications: No Patient Arrived: Wheel Chair Any new allergies or adverse reactions: No Arrival Time: 08:09 Had a fall or experienced change in No Accompanied By: wife activities of daily living that may affect Transfer Assistance: None risk of falls: Patient Requires Transmission-Based No Signs or symptoms of abuse/neglect since last visito No Precautions: Hospitalized since last visit: No Patient Has Alerts: Yes Has Dressing in Place as Prescribed: Yes Patient Alerts: NOT Pain Present Now: No Diabetic Electronic Signature(s) Signed: 03/08/2019 11:41:56 AM By: Army Melia Entered By: Army Melia on 03/08/2019 08:10:18 [name] ([id number]) -------------------------------------------------------------------------------- Encounter Discharge Information Details Patient Name: [name] Date of Service: 03/08/2019 8:00 AM Medical Record Number: [id number] Patient Account Number: [id number] Date of Birth/Sex: 10/05/1950 (68 y.o. M) Treating RN: Montey Hora Primary Care Hedi Barkan: [name] Other Clinician: Referring Shikita Vaillancourt: [name] Treating Beyonka Pitney/Extender: [name], HOYT Weeks in Treatment: 51 Encounter Discharge Information Items Discharge Condition: Stable Ambulatory Status: Wheelchair Discharge Destination: Home Transportation: Private Auto Accompanied By: wife Schedule Follow-up Appointment: Yes Clinical Summary  of Care: Electronic Signature(s) Signed: 03/08/2019 5:30:55 PM By: Montey Hora Entered By: Montey Hora on 03/08/2019 08:32:48 [name] ([id number]) -------------------------------------------------------------------------------- Lower Extremity Assessment Details Patient Name: [name] Date of Service: 03/08/2019 8:00 AM Medical Record Number: [id number] Patient Account Number: [id number] Date of Birth/Sex: February 19, 1951 (68 y.o. M) Treating RN: Army Melia Primary Care Oliviana Mcgahee: [name] Other Clinician: Referring Kindrick Lankford: [name] Treating Doyt Castellana/Extender: [name], HOYT Weeks in Treatment: 14 Electronic Signature(s) Signed: 03/08/2019 11:41:56 AM By: Army Melia Entered By: Army Melia on 03/08/2019 08:18:28 [name] ([id number]) -------------------------------------------------------------------------------- Multi Wound Chart Details Patient Name: [name] Date of Service: 03/08/2019 8:00 AM Medical Record Number: [id number] Patient Account Number: [id number] Date of Birth/Sex: [birth year] (68 y.o. M) Treating RN: Montey Hora Primary Care Celso Granja: [name] Other Clinician: Referring Naela Nodal: [name] Treating Burnette Sautter/Extender: [name], HOYT Weeks in Treatment: 77 Vital Signs Height(in): 73 Pulse(bpm): 85 Weight(lbs): 210 Blood Pressure(mmHg): 138/81 Body Mass Index(BMI): 28 Temperature(F): 98.4 Respiratory Rate 16 (breaths/min): Photos: [N/A:N/A] Wound Location: Right Gluteal fold N/A N/A Wounding Event: Pressure Injury N/A N/A Primary Etiology: Pressure Ulcer N/A N/A Comorbid History: Cataracts, Hypertension, N/A N/A Paraplegia Date Acquired: 11/02/2017 N/A N/A Weeks of Treatment: 66 N/A N/A Wound Status: Open N/A N/A Measurements L x W x D 3.3x1.5x3 N/A N/A (cm) Area (cm) : 3.888 N/A N/A Volume (cm) : 11.663 N/A N/A % Reduction in Area: 61.30% N/A N/A % Reduction in  Volume: -1060.50% N/A N/A Position 1 (o'clock): 12 Maximum Distance 1 (cm): 3.5 Tunneling: Yes N/A N/A Classification: Category/Stage IV N/A N/A Exudate Amount: Large N/A N/A Exudate Type: Serosanguineous N/A N/A Exudate Color: red, brown N/A N/A Wound Margin: Epibole N/A N/A Granulation Amount: Medium (34-66%) N/A N/A Granulation Quality: Red, Pink, Hyper-granulation N/A N/A Necrotic Amount: Medium (34-66%) N/A N/A Exposed Structures: Fat Layer (Subcutaneous N/A N/A Tissue) Exposed: Yes Muscle: Yes Fascia: No [name]. ([id number]) Tendon: No Joint: No Bone: No Epithelialization: None N/A N/A Treatment Notes Electronic Signature(s) Signed: 03/08/2019 5:30:55 PM By: Marjory Lies,  Di Kindle Entered By: Montey Hora on 03/08/2019 08:25:30 [name] ([id number]) -------------------------------------------------------------------------------- Blain Details Patient Name: [name] Date of Service: 03/08/2019 8:00 AM Medical Record Number: [id number] Patient Account Number: [id number] Date of Birth/Sex: 1951/03/13 (68 y.o. M) Treating RN: Montey Hora Primary Care Brock Larmon: [name] Other Clinician: Referring Akane Tessier: [name] Treating Evalin Shawhan/Extender: [name], HOYT Weeks in Treatment: 25 Active Inactive Orientation to the Wound Care Program Nursing Diagnoses: Knowledge deficit related to the wound healing center program Goals: Patient/caregiver will verbalize understanding of the [place] Program Date Initiated: 11/30/2017 Target Resolution Date: 12/21/2017 Goal Status: Active Interventions: Provide education on orientation to the wound center Notes: Pressure Nursing Diagnoses: Knowledge deficit related to management of pressures ulcers Goals: Patient/caregiver will verbalize understanding of pressure ulcer management Date Initiated: 05/17/2018 Target Resolution Date: 05/28/2018 Goal Status:  Active Interventions: Assess: immobility, friction, shearing, incontinence upon admission and as needed Notes: Wound/Skin Impairment Nursing Diagnoses: Impaired tissue integrity Goals: Patient/caregiver will verbalize understanding of skin care regimen Date Initiated: 11/30/2017 Target Resolution Date: 12/21/2017 Goal Status: Active Ulcer/skin breakdown will have a volume reduction of 30% by week 4 Date Initiated: 11/30/2017 Target Resolution Date: 12/21/2017 Goal Status: Active JARID, SASSO ([id number]) Interventions: Assess patient/caregiver ability to obtain necessary supplies Assess patient/caregiver ability to perform ulcer/skin care regimen upon admission and as needed Assess ulceration(s) every visit Treatment Activities: Skin care regimen initiated : 11/30/2017 Notes: Electronic Signature(s) Signed: 03/08/2019 5:30:55 PM By: Montey Hora Entered By: Montey Hora on 03/08/2019 08:25:19 MARQUON, ALCALA ([id number]) -------------------------------------------------------------------------------- Pain Assessment Details Patient Name: [name] Date of Service: 03/08/2019 8:00 AM Medical Record Number: [id number] Patient Account Number: [id number] Date of Birth/Sex: 1951-06-10 (68 y.o. M) Treating RN: Army Melia Primary Care Jamaris Biernat: [name] Other Clinician: Referring Jlynn Langille: [name] Treating Nou Chard/Extender: [name], HOYT Weeks in Treatment: 37 Active Problems Location of Pain Severity and Description of Pain Patient Has Paino No Site Locations Pain Management and Medication Current Pain Management: Electronic Signature(s) Signed: 03/08/2019 11:41:56 AM By: Army Melia Entered By: Army Melia on 03/08/2019 08:10:25 [name] ([id number]) -------------------------------------------------------------------------------- Patient/Caregiver Education Details Patient Name: [name] Date of Service:  03/08/2019 8:00 AM Medical Record Number: [id number] Patient Account Number: [id number] Date of Birth/Gender: 08/15/1950 (68 y.o. M) Treating RN: Montey Hora Primary Care Physician: [name] Other Clinician: Referring Physician: [name] Treating Physician/Extender: Sharalyn Ink in Treatment: 54 Education Assessment Education Provided To: Patient and Caregiver Education Topics Provided Wound/Skin Impairment: Handouts: Other: wound care to continue as ordered Methods: Demonstration, Explain/Verbal Responses: State content correctly Electronic Signature(s) Signed: 03/08/2019 5:30:55 PM By: Montey Hora Entered By: Montey Hora on 03/08/2019 08:32:09 [name] ([id number]) -------------------------------------------------------------------------------- Wound Assessment Details Patient Name: [name] Date of Service: 03/08/2019 8:00 AM Medical Record Number: [id number] Patient Account Number: [id number] Date of Birth/Sex: 1951-02-04 (68 y.o. M) Treating RN: Army Melia Primary Care Leatrice Parilla: [name] Other Clinician: Referring Marguerita Stapp: [name] Treating Unknown Flannigan/Extender: [name], HOYT Weeks in Treatment: 66 Wound Status Wound Number: 1 Primary Etiology: Pressure Ulcer Wound Location: Right Gluteal fold Wound Status: Open Wounding Event: Pressure Injury Comorbid History: Cataracts, Hypertension, Paraplegia Date Acquired: 11/02/2017 Weeks Of Treatment: 66 Clustered Wound: No Photos Wound Measurements Length: (cm) 3.3 Width: (cm) 1.5 Depth: (cm) 3 Area: (cm) 3.888 Volume: (cm) 11.663 % Reduction in Area: 61.3% % Reduction in Volume: -1060.5% Epithelialization: None Tunneling: Yes Position (o'clock): 12 Maximum Distance: (cm) 3.5 Wound Description Classification: Category/Stage IV  Wound Margin: Epibole Exudate Amount: Large Exudate Type: Serosanguineous Exudate Color: red, brown Foul Odor After  Cleansing: No Slough/Fibrino Yes Wound Bed Granulation Amount: Medium (34-66%) Exposed Structure Granulation Quality: Red, Pink, Hyper-granulation Fascia Exposed: No Necrotic Amount: Medium (34-66%) Fat Layer (Subcutaneous Tissue) Exposed: Yes Necrotic Quality: Adherent Slough Tendon Exposed: No Muscle Exposed: Yes Necrosis of Muscle: No Joint Exposed: No Bone Exposed: No Fargnoli, Nassim J. ([id number]) Treatment Notes Wound #1 (Right Gluteal fold) Notes zinc to peri wound, Hydrafera Blue, xtrasorb and tegaderm Electronic Signature(s) Signed: 03/09/2019 11:51:46 AM By: Harold Barban Signed: 03/09/2019 3:15:56 PM By: Army Melia Previous Signature: 03/08/2019 11:41:56 AM Version By: Army Melia Entered By: Harold Barban on 03/09/2019 11:51:46 Otero, [name] ([id number]) -------------------------------------------------------------------------------- Vitals Details Patient Name: [name] Date of Service: 03/08/2019 8:00 AM Medical Record Number: [id number] Patient Account Number: [id number] Date of Birth/Sex: 12-28-1950 (68 y.o. M) Treating RN: Army Melia Primary Care Lashina Milles: [name] Other Clinician: Referring Argelio Granier: [name] Treating Anfernee Peschke/Extender: [name], HOYT Weeks in Treatment: 76 Vital Signs Time Taken: 08:10 Temperature (F): 98.4 Height (in): 73 Pulse (bpm): 85 Weight (lbs): 210 Respiratory Rate (breaths/min): 16 Body Mass Index (BMI): 27.7 Blood Pressure (mmHg): 138/81 Reference Range: 80 - 120 mg / dl Electronic Signature(s) Signed: 03/08/2019 11:41:56 AM By: Army Melia Entered By: Army Melia on 03/08/2019 08:10:52

## 2019-03-15 ENCOUNTER — Encounter: Payer: Medicare Other | Admitting: Physician Assistant

## 2019-03-15 ENCOUNTER — Other Ambulatory Visit: Payer: Self-pay

## 2019-03-15 DIAGNOSIS — L89314 Pressure ulcer of right buttock, stage 4: Secondary | ICD-10-CM | POA: Diagnosis not present

## 2019-03-15 NOTE — Progress Notes (Addendum)
Brett, [name] ([id number]) Visit Report for 03/15/2019 Chief Complaint Document Details Patient Name: [name], [name] Date of Service: 03/15/2019 8:00 AM Medical Record Number: [id number] Patient Account Number: [id number] Date of Birth/Sex: Sep 25, 1950 (68 y.o. M) Treating RN: Montey Hora Primary Care Provider: [name] Other Clinician: Referring Provider: [name] Treating Provider/Extender: [name], HOYT Weeks in Treatment: 2 Information Obtained from: Patient Chief Complaint Right gluteal fold ulcer Electronic Signature(s) Signed: 03/15/2019 8:25:59 AM By: Worthy Keeler [name] Entered By: Worthy Keeler on 03/15/2019 08:25:58 SEIJI, LETBETTER ([id number]) -------------------------------------------------------------------------------- HPI Details Patient Name: [name] Date of Service: 03/15/2019 8:00 AM Medical Record Number: [id number] Patient Account Number: [id number] Date of Birth/Sex: 12-03-1950 (68 y.o. 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 He states that there is no history of diabetes that he is aware of. He does have issues with his prostate and is currently receiving treatment for this by way of oral medication. With that being said I do not have a lot of details in that regard. Nonetheless the patient presents today as a result of having been referred to [place] by another provider initially home health was set to come out and take care of his wound although due to the fact that  he apparently drives he's not able to receive home health. His wife is therefore trying to help take care of this wound within although they have been struggling with what exactly to do at this point. She states that she can do some things but she is definitely not a nurse and does have some issues with looking at blood. The good news is the wound does not appear to be too deep and is fairly superficial at this point. There is no slough noted there is some nonviable skin noted around the surface of the wound and the perimeter at this point. The central portion of the wound appears to be very good with a dermal layer noted this does not appear to be again deep enough to extend it to subcutaneous tissue at this point. 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 In the pump he has Dilaudid and baclofen. 12/07/17 on evaluation today patient presents for follow-up concerning his ongoing lower back thigh ulcer on the right. He states that he did not get the supplies ordered and therefore has not really been able to perform the dressing changes as directed exactly. His wife was able to get some Boarder Foam Dressing's from the drugstore and subsequently has been using hydrogel which did help to a degree in the wound does appear to be able smaller. There is actually more drainage this week noted than previous. 12/21/17 on evaluation today patient appears to be doing rather well in regard to his right gluteal ulcer. He has been tolerating the dressing changes without complication. There does not appear to be any evidence of infection at this point in time. Overall the wound does seem to be making some progress as far as the edges  are concerned there's not as much in the way of overlapping of the external wound edges and he has a good epithelium to wound bed border for the most part. This however is not true  right at the 12 o'clock location over the span of a little over a centimeters which actually will require debridement today to clean this away and hopefully allow it to continue to heal more appropriately. 12/28/17 on evaluation today patient appears to be doing rather well in regard to his ulcer in the left gluteal region. He's been tolerating the dressing changes without complication. Apparently he has had some difficulty getting his dressing material. Apparently there's been some confusion with ordering we're gonna check into this. Nonetheless overall he's been showing signs of improvement which is good news. Debridement is not required today. 01/04/18 on evaluation today patient presents for follow-up concerning his right gluteal ulcer. He has been tolerating the dressing changes fairly well. On inspection today it appears he may actually have some maceration them concerned about the fact that he may be developing too much moisture in and around the wound bed which can cause delay in healing. With that being said he unfortunately really has not showed significant signs of improvement since last week's evaluation in fact this may even be just the little bit/slightly larger. Nonetheless he's been having a lot of discomfort I'm not sure this is even related to the wound as he has no pain when I'm to breeding or otherwise cleaning the wound during evaluation today. Nonetheless this is something that we did recommend he talked to his pain specialist concerning. 01/11/18 on evaluation today patient appears to be doing better in regard to his ulceration. He has been tolerating the dressing changes without complication. With that being said overall there's no evidence of infection which is good news. The only thing is he did receive the hatch affair blue classic versus the ready nonetheless I feel like this is perfectly fine and appears to have done well for him over the past week. 01/25/18 on evaluation  today patient's wound actually appears to be a little bit larger than during the last evaluation. The good MOSES, ODOHERTY. ([id number]) news is the majority of the wound edges actually appear to be fairly firmly attached to the wound bed unfortunately again we're not really making progress in regard to the size. Roughly the wound is about the same size as when I first saw him although again the wound margin/edges appear to be much better. 02/01/18 on evaluation today patient actually appears to be doing very well in regard to his wound. Applying the Prisma dry does seem to be better although he does still have issues with slow progression of the wound. There was a slight improvement compared to last week's measurements today. Nonetheless I have been considering other options as far as the possibility of Theraskin or even a snap vac. In general I'm not sure that the Theraskin due to location of the wound would be a very good idea. Nonetheless I do think that a snap vac could be a possibility for the patient and in fact I think this could even be an excellent way to manage the wound possibly seeing some improvement in a very rapid fashion here. Nonetheless this is something that we would need to get approved and I did have a lengthy conversation with the patient about this today. 02/08/18 on evaluation today patient appears to be doing a little better in regard to his ulcer.  He has been tolerating the dressing changes without complication. Fortunately despite the fact that the wound is a little bit smaller it's not significantly so unfortunately. We have discussed the possibility of a snap vac we did check with insurance this is actually covered at this point. Fortunately there does not appear to be any sign of infection. Overall I'm fairly pleased with how things seem to be appearing at this point. 02/15/18 on evaluation today patient appears to be doing rather well in regard to his right gluteal  ulcer. Unfortunately the snap vac did not stay in place with his sheer and friction this came loose and did not seem to maintain seal very well. He worked for about two days and it did seem to do very well during that time according to his wife but in general this does not seem to be something that's gonna be beneficial for him long-term. I do believe we need to go back to standard dressings to see if we can find something that will be of benefit. 03/02/18- He is here in follow up evaluation; there is minimal change in the wound. He will continue with the same treatment plan, would consider changing to iodosrob/iodoflex if ulcer continues to to plateau. He will follow up next week 03/08/18 on evaluation today patient's wound actually appears to be about the same size as when I previously saw him several weeks back. Unfortunately he does have some slightly dark discoloration in the central portion of the wound which has me concerned about pressure injury. I do believe he may be sitting for too long a period of time in fact he tells me that "I probably sit for much too long". He does have some Slough noted on the surface of the wound and again as far as the size of the wound is concerned I'm really not seeing anything that seems to have improved significantly. 03/15/18 on evaluation today patient appears to be doing fairly well in regard to his ulcer. The wound measured pretty much about the same today compared to last week's evaluation when looking at his graph. With that being said the area of bruising/deep tissue injury that was noted last week I do not see at this point. He did get a new cushion fortunately this does seem to be have been of benefit in my pinion. It does appear that he's been off of this more which is good news as well I think that is definitely showing in the overall wound measurements. With that being said I do believe that he needs to continue to offload I don't think that the fact  this is doing better should be or is going to allow him to not have to offload and explain this to him as well. Overall he seems to be in agreement the plan I think he understands. The overall appearance of the wound bed is improved compared to last week I think the Iodoflex has been beneficial in that regard. 03/29/18 on evaluation today patient actually appears to be doing rather well in regard to his wound from the overall appearance standpoint he does have some granulation although there's some Slough on the surface of the wound noted as well. With that being said he unfortunately has not improved in regard to the overall measurement of the wound in volume or in size. I did have a discussion with him very specifically about offloading today. He actually does work although he mainly is just sitting throughout the day. He tells me  he offloads by "lifting himself up for 30 seconds off of his chair occasionally" purchase from [employer] which does seem to have helped. And he has a new cushion that he with that being said he's also able to stand some for a very short period of time but not significant enough I think to provide appropriate offloading. I think the biggest issue at this point with the wound and the fact is not healing as quickly as we would like is due to the fact that he is really not able to appropriately offload while at work. He states the beginning after his injury he actually had a bed at his job that he could lay on in order to offload and that does seem to have been of help back at that time. Nonetheless he had not done this in quite some time unfortunately. I think that could be helpful for him this is something I would like for him to look into. 04/05/18 on evaluation today patient actually presents for follow-up concerning his right gluteal ulcer. Again he really is not significantly improved even compared to last week. He has been tolerating the dressing changes without  complication. With that being said fortunately there appears to be no evidence of infection at this time. He has been more proactive in trying to offload. DEANNA, BOEHLKE ([id number]) 04/12/18 on evaluation today patient actually appears to be doing a little better in regard to his wound and the right gluteal fold region. He's been tolerating the dressing changes since removing the oasis without complication. However he was having a lot of burning initially with the oasis in place. He's unsure of exactly why this was given so much discomfort but he assumes that it was the oasis itself causing the problem. Nonetheless this had to be removed after about three days in place although even those three days seem to have made a fairly good improvement in regard to the overall appearance of the wound bed. In fact is the first time that he's made any improvement from the standpoint of measurements in about six weeks. He continues to have no discomfort over the area of the wound itself which leads me to wonder why he was having the burning with the oasis when he does not even feel the actual debridement's themselves. I am somewhat perplexed by this. 04/19/18 on evaluation today patient's wound actually appears to be showing signs of epithelialization around the edge of the wound and in general actually appears to be doing better which is good news. He did have the same burning after about three days with applying the Endoform last week in the same fashion that I would generally apply a skin substitute. This seems to indicate that it's not the oasis to cause the problem but potentially the moisture buildup that just causes things to burn or there may be some other reaction with the skin prep or Steri-Strips. Nonetheless I'm not sure that is gonna be able to tolerate any skin substitute for a long period of time. The good news is the wound actually appears to be doing better today compared to last week and  does seem to finally be making some progress. 04/26/18 on evaluation today patient actually appears to be doing rather well in regard to his ulcer in the right gluteal fold. He has been tolerating the dressing changes without complication which is good news. The Endoform does seem to be helping although he was a little bit more macerated this week. This  seems to be an ongoing issue with fluid control at this point. Nonetheless I think we may be able to add something like Drawtex to help control the drainage. 05/03/18 on evaluation today patient appears to actually be doing better in regard to the overall appearance of his wound. He has been tolerating the dressing changes without complication. Fortunately there appears to be no evidence of infection at this time. I really feel like his wound has shown signs as of today of turning around last week I thought so as well and definitely he could be seen in this week's overall appearance and measurements. In general I'm very pleased with the fact that he finally seems to be making a steady but sure progress. The patient likewise is very pleased. 05/17/18 on evaluation today patient appears to be doing more poorly unfortunately in regard to his ulcer. He has been tolerating the dressing changes without complication. With that being said he tells me that in the past couple of days he and his wife have noticed that we did not seem to be doing quite as well is getting dark near the center. Subsequently upon evaluation today the wound actually does appear to be doing worse compared to previous. He has been tolerating the dressing changes otherwise and he states that he is not been sitting up anymore than he was in the past from what he tells me. Still he has continued to work he states "I'm tired of dealing with this and if I have to just go home and lay in the bed all the time that's what I'll do". Nonetheless I am concerned about the fact that this wound does  appear to be deeper than what it was previous. 05/24/18 upon evaluation today patient actually presents after having been in the hospital due to what was presumed to be sepsis secondary to the wound infection. He had an elevated white blood cell count between 14 and 15. With that being said he does seem to be doing somewhat better now. His wound still is giving him some trouble nonetheless and he is obviously concerned about the fact likely talked about that this does seem to go more deeply than previously noted. I did review his wound culture which showed evidence of Staphylococcus aureus him and group B strep. Nonetheless he is on antibiotics, Levaquin, for this. Subsequently I did review his intake summary from the hospital as well. I also did look at the CT of the lumbar spine with contrast that was performed which showed no bone destruction to suggest lumbar disguises/osteomyelitis or sacral osteomyelitis. There was no paraspinal abscess. Nonetheless it appears this may have been more of just a soft tissue infection at this point which is good news. He still is nonetheless concerned about the wound which again I think is completely reasonable considering everything he's been through recently. 05/31/18 on evaluation today on evaluation today patient actually appears to be showing signs of his wound be a little bit deeper than what I would like to see. Fortunately he does not show any signs of significant infection although his temperature was 99 today he states he's been checking this at home and has not been elevated. Nonetheless with the undermining that I'm seeing at this point I am becoming more concerned about the wound I do think that offloading is a key factor here that is preventing the speedy recovery at this point. There does not appear to be any evidence of again over infection noted. He's been using  Santyl currently. 06/07/18 the patient presents today for follow-up evaluation  regarding the left ulcer in the gluteal region. He has been tolerating the Wound VAC fairly well. He is obviously very frustrated with this he states that to mean is really getting in his way. There does not appear to be any evidence of infection at this time he does have a little bit of odor I do not necessarily associate this with infection just something that we sometimes notice with Wound VAC therapy. With that being said I can definitely catch a tone of discontentment overall in the patient's demeanor today. This when he was previously in the hospital ARSHIA, RONDON. ([id number]) an CT scan was done of the lumbar region which did not reveal any signs of osteomyelitis. With that being said the pelvis in particular was not evaluated distinctly which means he could still have some osteonecrosis I. Nonetheless the Wound VAC was started on Thursday I do want to get this little bit more time before jumping to a CT scan of the pelvis although that is something that I might would recommend if were not see an improvement by that time. 06/14/18 on evaluation today patient actually appears to be doing about the same in regard to his right gluteal ulcer. Again he did have a CT scan of the lumbar spine unfortunately this did not include the pelvis. Nonetheless with the depth of the wound that I'm seeing today even despite the fact that I'm not seeing any evidence of overt cellulitis I believe there's a good chance that we may be dealing with osteomyelitis somewhere in the right Ischial region. No fevers, chills, nausea, or vomiting noted at this time. 06/21/18 on evaluation today patient actually appears to be doing about the same with regard to his wound. The tunnel at 6 o'clock really does not appear to be any deeper although it is a little bit wider. I think at this point you may want to start packing this with white phone. Unfortunately I have not got approval for the CT scan of the pelvis as of yet  due to the fact that Medicare apparently has been denied it due to the diagnosis codes not being appropriate according to Medicare for the test requested. With that being said the patient cannot have an MRI and therefore this is the only option that we have as far as testing is concerned. The patient has had infection and was on antibiotics and been added code for cellulitis of the bottom to see if this will be appropriate for getting the test approved. Nonetheless I'm concerned about the infection have been spread deeper into the Ischial region. 06/28/18 on evaluation today patient actually appears to be doing rather well all things considered in regard to the right gluteal ulcer. He has been tolerating the dressing changes without complication. With that being said the Wound VAC he states does have to be replaced almost every day or at least reinforced unfortunately. Patient actually has his CT scan later this morning we should have the results by tomorrow. 07/05/18 on evaluation today patient presents for follow-up concerning his right Ischial ulcer. He did see the surgeon Dr. Lysle Pearl last week. They were actually very happy with him and felt like he spent a tremendous amount of time with them as far as discussing his situation was concerned. In the end Dr. Lysle Pearl did contact me as well and determine that he would not recommend any surgical intervention at this point as he felt like  it would not be in the patient's best interest based on what he was seeing. He recommended a referral to infectious disease. Subsequently this is something that Dr. Ines Bloomer office is working on setting up for the patient. As far as evaluation today is concerned the patient's wound actually appears to be worse at this point. I am concerned about how things are progressing and specifically about infection. I do not feel like it's the deeper but the area of depth is definitely widening which does have me concerned. No  fevers, chills, nausea, or vomiting noted at this time. I think that we do need initiate antibiotic therapy the patient has an allow allergy to amoxicillin/penicillin he states that he gets a rash since childhood. Nonetheless she's never had the issues with Catholics or cephalosporins in general but he is aware of. 07/27/18 on evaluation today patient presents following admission to the hospital on 07/09/18. He was subsequently discharged on 07/20/18. On 07/15/18 the patient underwent irrigation and debridement was soft tissue biopsy and bone biopsy as well as placement of a Wound VAC in the OR by Dr. Celine Ahr. During the hospital course the patient was placed on a Wound VAC and recommended follow up with surgery in three weeks actually with Dr. Delaine Lame who is infectious disease. The patient was on vancomycin during the hospital course. 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 He also had a bone culture which revealed evidence of methicillin-resistant staph aureus. He is updated CT scan 07/09/18 reveals that he had progression of the which was performed on wound to breakdown down to the trochanter where he actually had irregularities there as well suggestive of osteomyelitis. This was a change just since 9 December when we last performed a CT scan. Obviously this one had gone downhill quite significantly and rapidly. At this point upon evaluation I feel like in general the patient's wound seems to be doing fairly well all things considered upon my evaluation today. Obviously this is larger and deeper than what I previously evaluated but at the same time he seems to be making some progress as far as the appearance of the granulation tissue is concerned. I'm happy in that regard. No fevers, chills, nausea, or vomiting noted at this time. He is on IV vancomycin and Rocephin at the facility. He is currently in [employer]. 08/03/18 upon evaluation today  patient's wound appears to be doing better in regard to the overall appearance at this point in time. Fortunately he's been tolerating the Wound VAC without complication and states that the facility has been taking excellent care of the wound site. Overall I see some Slough noted on the surface which I am going to attempt sharp debridement today of but nonetheless other than this I feel like he's making progress. 08/09/18 on evaluation today patient's wound appears to be doing much better compared to even last week's evaluation. Do believe that the Wound VAC is been of great benefit for him. He has been tolerating the dressing changes that is the Wound LAREN, ESTRIDGE. ([id number]) VAC without any complication and he has excellent granulation noted currently. There is no need for sharp debridement at this point. 08/16/18 on evaluation today patient actually appears to be doing very well in regard to the wound in the right gluteal fold region. This is showing signs of progress and again appears to be very healthy which is excellent news. Fortunately there is no sign of active infection by way of odor  or drainage at this point. Overall I'm very pleased with how things stand. He seems to be tolerating the Wound VAC without complication. 08/23/18 on evaluation today patient actually appears to be doing better in regard to his wound. He has been tolerating the Wound VAC without complication and in fact it has been collecting a significant amount of drainage which I think is good news especially considering how the wound appears. Fortunately there is no signs of infection at this time definitely nothing appears to be worse which is good news. He has not been started on the Bactrim and Flagyl that was recommended by Dr. Delaine Lame yet. I did actually contact her office this morning in order to check and see were things are that regard their gonna be calling me back. 08/30/18 on evaluation today patient  actually appears to show signs of excellent improvement today compared to last evaluation. The undermining is getting much better the wound seems to be feeling quite nicely and I'm very pleased that the granulation in general. With that being said overall I feel like the patient has made excellent progress which is great news. No fevers, chills, nausea, or vomiting noted at this time. 09/06/18 on evaluation today patient actually appears to be doing rather well in regard to his right gluteal ulcer. This is showing signs of improvement in overall I'm very pleased with how things seem to be progressing. The patient likewise is please. Overall I see no evidence of infection he is about to complete his oral antibiotic regimen which is the end of the antibiotics for him in just about three days. 09/13/18 on evaluation today patient's right Ischial ulcer appears to be showing signs of continued improvement which is excellent news. He's been tolerating the dressing changes without complication. Fortunately there's no signs of infection and the wound that seems to be doing very well. 09/28/18 on evaluation today patient appears to be doing rather well in regard to his right Ischial ulcer. He's been tolerating the Wound VAC without complication he knows there's much less drainage than there used to be this obviously is not a bad thing in my pinion. There's no evidence of infection despite the fact is but nothing about it now for several weeks. 10/04/18 on evaluation today patient appears to be doing better in regard to his right Ischial wound. He has been tolerating the Wound VAC without complication and I do believe that the silver nitrate last week was beneficial for him. Fortunately overall there's no evidence of active infection at this time which is great news. No fevers, chills, nausea, or vomiting noted at this time. 10/11/18 on evaluation today patient actually appears to be doing rather well in regard  to his Ischial ulcer. He's been tolerating the Wound VAC still without complication I feel like this is doing a good job. No fevers, chills, nausea, or vomiting noted at this time. 11/01/18 on evaluation today patient presents after having not been seen in our clinic for several weeks secondary to the fact that he was on evaluation today patient presents after having not been seen in our clinic for several weeks secondary to the fact that he was in a skilled nursing facility which was on lockdown currently due to the covert 19 national emergency. Subsequently he was discharged from the facility on this past Friday and subsequently made an appointment to come in to see yesterday. Fortunately there's no signs of active infection at this time which is good news and overall he does  seem to have made progress since I last saw. Overall I feel like things are progressing quite nicely. The patient is having no pain. 11/08/18 on evaluation today patient appears to be doing okay in regard to his right gluteal ulcer. He has been utilizing a Wound VAC home health this changing this at this point since he's home from the skilled nursing facility. Fortunately there's no signs of obvious active infection at this time. Unfortunately though there's no obvious active infection he is having some maceration and his wife states that when the sheets of the Wound VAC office on Sunday when it broke seal that he ended up having significant issues with some smell as well there concerned about the possibility of infection. Fortunately there's No fevers, chills, nausea, or vomiting noted at this time. 11/15/18 on evaluation today patient actually appears to be doing well in regard to his right gluteal ulcer. He has been tolerating the dressing changes without complication. Specifically the Wound VAC has been utilized up to this point. Fortunately there's no signs of infection and overall I feel like he has made progress even  since last week when I last saw him. I'm actually fairly happy with the overall appearance although he does seem to have somewhat of a hyper granular Dorion, Rodric J. ([id number]) overgrowth in the central portion of the wound which I think may require some sharp debridement to try flatness out possibly utilizing chemical cauterization following. 11/23/18 on evaluation today patient actually appears to be doing very well in regard to his sacral ulcer. He seems to be showing signs of improvement with good granulation. With that being said he still has the small area of hyper granulation right in the central portion of the wound which I'm gonna likely utilize silver nitrate on today. Subsequently he also keeps having a leak at the 6 o'clock location which is unfortunate we may be able to help out with some suggestions to try to prevent this going forward. Fortunately there's no signs of active infection at this time. 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 He's been tolerating the dressing changes without complication. Fortunately there's no signs of active infection at this time. I've been rather pleased with how things have progressed there still some evidence of pressure getting to the area with some redness right around the immediate wound opening. Nonetheless other than this I'm not seeing any significant complications or issues the wound is somewhat hyper granular. Upon discussing with the patient and his wife today I'm not sure that the wound is being packed to the base with the foam at this point. And if it's not been packed fully that may be part of the reason why is not seen as much improvement as far as the granulation from the base out. Again we do not want pack too tightly but we need some of the firm to get to the base of the wound. I discussed this with patient and his wife  today. 12/06/18 on evaluation today patient appears to be doing well in regard to his right gluteal pressure ulcer. He's been tolerating the dressing changes without complication. Fortunately there's no signs of active infection. He still has some hyper granular tissue and I do think it would be appropriate to continue with the chemical cauterization as of today. 12/16/18 on evaluation today patient actually appears to be doing okay in regard to his right  gluteal ulcer. He is been tolerating the dressing changes without complication including the Wound VAC. Overall I feel like nothing seems to be worsening I do feel like that the hyper granulation buds in the central portion of the wound have improved to some degree with the silver nitrate. We will have to see how things continue to progress. 12/20/18 on evaluation today patient actually appears to be doing much worse in my pinion even compared to last week's evaluation. Unfortunately as opposed to showing any signs of improvement the areas of hyper granular tissue in the central portion of the wound seem to be getting worse. Subsequently the wound bed itself also seems to be getting deeper even compared to last week which is both unusual as well as concerning since prior he had been shown signs of improvement. Nonetheless I think that the issue could be that he's actually having some difficulty in issues with a deeper infection. There's no external signs of infection but nonetheless I am more worried about the internal, osteomyelitis, that could be restarting. He has not been on antibiotics for some time at this point. I think that it may be a good idea to go ahead and started back on an antibiotic therapy while we wait to see what the testing shows. 12/27/18 on evaluation today patient presents for follow-up concerning his left gluteal fold wound. Fortunately he appears to be doing well today. I did review the CT scan which was negative for any signs of  osteomyelitis or acute abnormality this is excellent news. Overall I feel like the surface of the wound bed appears to be doing significantly better today compared to previously noted findings. There does not appear any signs of infection nor does he have any pain at this time. 01/03/19 on evaluation today patient actually appears to be doing quite well in regard to his ulcer. Post debridement last week he really did not have too much bleeding which is good news. Fortunately today this seems to be doing some better but we still has some of the hyper granular tissue noted in the base of the wound which is gonna require sharp debridement today as well. Overall I'm pleased with how things seem to be progressing since we switched away from the Wound VAC I think he is making some progress. 01/10/19 on evaluation today patient appears to be doing better in regard to his right gluteal fold ulcer. He has been tolerating the dressing changes without complication. The debridement to seem to be helping with current away some of the poor hyper granular tissue bugs throughout the region of his gluteal fold wound. He's been tolerating the dressing changes otherwise without complication which is great news. No fevers, chills, nausea, or vomiting noted at this time. 01/17/19 on evaluation today patient actually appears to be doing excellent in regard to his wound. He's been tolerating the dressing changes without complication. Fortunately there is no signs of active infection at this time which is great news. No fevers, chills, nausea, or vomiting noted at this time. 01/24/19 on evaluation today patient actually appears to be doing quite well with regard to his ulcer. He has been tolerating the dressing changes without complication. Fortunately there's no signs of active infection at this time. Overall been very pleased with the progress that he seems to be making currently. 01/31/19-Patient returns at 1 week with  apparent similarity in dimensions to the wound, with no signs of infection, he has been [place] ([id number]) changing dressings twice  a day 02/08/19 upon evaluation today patient actually appears to be doing well with regard to his right Ischial ulcer. The wound is not appear to be quite as deep and seems to be making progress which is good news. With that being said I'm still reluctant to go back to the Wound VAC at this point. He's been having to change the dressings twice a day which is a little bit much in my pinion from the wound care supplies standpoint. I think that possibly attempting to utilize extras orbit may be beneficial this may also help to prevent any additional breakdown secondary to fluid retention in the wound itself. The patient is in agreement with giving this a try. 02/15/19 on evaluation today patient actually appears to be doing decently well with regard to his ulcer in the right to gluteal fold location. He's been tolerating the dressing changes without complication. Fortunately there is no signs of active infection at this time. He is able to keep the current dressing in place more effectively for a day at a time whereas before he was having a changes to to three times a day. The actions or has been helpful in this regard. Fortunately there's no signs of anything getting worse and I do feel like he showing signs of good improvement with regard to the wound bed status. 02/22/2019 patient appears to be doing very well today with regard to his ulcer in the gluteal fold. Fortunately there is no signs of active infection and he has been tolerating the dressing changes without any complication. Overall extremely pleased with how things seem to be progressing. He has much less of the hyper granular projections within the wound these have slowly been debrided away and he seems to be doing well. The wound bed is more uniform. 03/01/19 on evaluation today patient appears to  be doing unfortunately about the same in regard to his gluteal ulcer. He's been tolerating the dressing changes without complication. Fortunately there's no signs of active infection at this time. With that being said he continues to develop these hyper granular projections which I'm unsure of exactly what they are and why they are rising. Nonetheless I explained to the patient that I do believe it would be a good idea for [place] to stand a biopsy sample for pathology to see if that can shed any light on what exactly may be going on here. Fortunately I do not see any obvious signs of infection. With that being said the patient has had a little bit more drainage this week apparently compared to last week. 03/08/2019 on evaluation today patient actually appears to look somewhat better with regard to the appearance of his wound bed at this time. This is good news. Overall I am very pleased with how things seem to have progressed just in the past week with a switch to the [facility] dressing. I think that has been beneficial for him. With that being said at this time the patient is concerned about his biopsy that I sent off last week unfortunately I do not have that report as of yet. Nonetheless we have called to obtain this and hopefully will hear back from the lab later this morning. 03/15/19 on evaluation today patient's wound actually appears to be doing okay today with regard to the overall appearance of the wound bed. He has been tolerating the dressing changes without complication he still has hyper granular tissue noted but fortunately that seems to be minimal at this point compared  to some of what we've seen in the past. Nonetheless I do think that he is still having some issues currently with some of this type of granulation the biopsy and since all showed nothing more than just evidence of granulation tissue. Therefore there really is nothing different to initiate or do at this point. Electronic  Signature(s) Signed: 03/16/2019 11:36:49 PM By: Worthy Keeler [name] Entered By: Worthy Keeler on 03/15/2019 23:17:23 DRAYLON, FEBRES ([id number]) -------------------------------------------------------------------------------- [facility] TISS Details Patient Name: [name] Date of Service: 03/15/2019 8:00 AM Medical Record Number: [id number] Patient Account Number: [id number] Date of Birth/Sex: Jan 14, 1951 (68 y.o. M) Treating RN: Montey Hora Primary Care Provider: [name] Other Clinician: Referring Provider: [name] Treating Provider/Extender: [name], HOYT Weeks in Treatment: 26 Procedure Performed for: Wound #1 Right Gluteal fold Performed By: Physician [name], HOYT E., [name] Post Procedure Diagnosis Same as Pre-procedure Notes 3 sticks of silver nitrate used Electronic Signature(s) Signed: 03/15/2019 4:25:23 PM By: Montey Hora Entered By: Montey Hora on 03/15/2019 08:36:38 [name] ([id number]) -------------------------------------------------------------------------------- Physical Exam Details Patient Name: [name] Date of Service: 03/15/2019 8:00 AM Medical Record Number: [id number] Patient Account Number: [id number] Date of Birth/Sex: 10-25-1950 (68 y.o. M) Treating RN: Montey Hora Primary Care Provider: [name] Other Clinician: Referring Provider: [name] Treating Provider/Extender: [name], HOYT Weeks in Treatment: 43 Constitutional Well-nourished and well-hydrated in no acute distress. Respiratory normal breathing without difficulty. clear to auscultation bilaterally. Cardiovascular regular rate and rhythm with normal S1, S2. Psychiatric this patient is able to make decisions and demonstrates good insight into disease process. Alert and Oriented x 3. pleasant and cooperative. Notes Upon inspection patient's wound bed actually showed signs of good granulation in  some areas although in other locations he was having some issues with hyper granular buds which again may not be a major problem or in other cases may call some slowing in the overall feeling in and granulation of the wound. Nonetheless I'm still pleased with the appearance of the wound I think with [facility] Dressing he has done much better than what we will see in previous. No sharp debridement was performed today although I did perform chemical cauterization with silver nitrate over the wound bed. Electronic Signature(s) Signed: 03/16/2019 11:36:49 PM By: Worthy Keeler [name] Entered By: Worthy Keeler on 03/15/2019 23:18:09 SEBASTHIAN, BELTRAME ([id number]) -------------------------------------------------------------------------------- Physician Orders Details Patient Name: [name] Date of Service: 03/15/2019 8:00 AM Medical Record Number: [id number] Patient Account Number: [id number] Date of Birth/Sex: 12-18-1950 (68 y.o. 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 Skin Barriers/Peri-Wound Care Wound #1 Right Gluteal fold o Other: - Apply zinc oxide to peri-wound area. Primary Wound Dressing Wound #1 Right Gluteal fold o Hydrafera Blue Ready Transfer Secondary  Dressing Wound #1 Right Gluteal fold o XtraSorb - Secure with Tegaderm Dressing Change Frequency Wound #1 Right Gluteal fold o  Change dressing every day. Follow-up Appointments Wound #1 Right Gluteal fold o Return Appointment in 1 week. Off-Loading Wound #1 Right Gluteal fold o Turn and reposition every 2 hours ANATOLI, NOLA ([id number]) Electronic Signature(s) Signed: 03/15/2019 4:25:23 PM By: Montey Hora Signed: 03/16/2019 11:36:49 PM By: Worthy Keeler [name] Entered By: Montey Hora on 03/15/2019 08:35:39 LAYKE, CATRETT ([id number]) -------------------------------------------------------------------------------- Problem List Details Patient Name: [name] Date of Service: 03/15/2019 8:00 AM Medical Record Number: [id number] Patient Account Number: [id number] Date of Birth/Sex: 1950-07-23 (68 y.o. 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 He states that there is no history of diabetes that he is aware of. He does have issues with his prostate and is currently receiving treatment for this by way of oral medication. With that being said I do not have a lot of details in that regard. Nonetheless the patient presents today as a result of having been referred to [place] by another provider initially home health was set to come out and take care of his wound although due to the fact that he apparently drives he's not able to receive home health. His wife is therefore trying to help take care of this wound within although they have been struggling with what exactly to do at this point. She states that she can do some things but she is definitely not a nurse and does have some issues with looking at blood. The good news is the wound does not appear to be too deep and is fairly superficial at this point. There is no slough noted there is some nonviable skin noted around the surface of the wound and the perimeter at this point. The central portion of the wound appears to be very good with a dermal layer noted this does not appear to be again deep enough to extend it to subcutaneous tissue at this point. 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 In the pump he  has Dilaudid and baclofen. 12/07/17 on evaluation today patient presents for follow-up concerning his ongoing lower back thigh ulcer on the right. He states that he did not get the supplies  ordered and therefore has not really been able to perform the dressing changes as directed exactly. His wife was able to get some [place]er Foam Dressing's from the drugstore and subsequently has been using hydrogel which did help to a degree in the wound does appear to be able smaller. There is actually more drainage this week noted than previous. 12/21/17 on evaluation today patient appears to be doing rather well in regard to his right gluteal ulcer. He has been tolerating the dressing changes without complication. There does not appear to be any evidence of infection at this point in time. Overall the wound does seem to be making some progress as far as the edges are concerned there's not as much in the way of overlapping of the external wound edges and he has a good epithelium to wound bed border for the most part. This however is not true right at the 12 o'clock location over the span of a little over a centimeters which actually will require debridement today to clean this away and hopefully allow it to continue to heal more appropriately. 12/28/17 on evaluation today patient appears to be doing rather well in regard to his ulcer in the left gluteal region. He's been tolerating the dressing changes without complication. Apparently he has had some difficulty getting his dressing material. Apparently there's been some confusion with ordering we're gonna check into this. Nonetheless overall he's been showing signs of improvement which is good news. Debridement is not required today. 01/04/18 on evaluation today patient presents for follow-up concerning his right gluteal ulcer. He has been tolerating the dressing changes fairly well. On inspection today it appears he may actually have some maceration them concerned  about the fact that he may be developing too much moisture in and around the wound bed which can cause delay in healing. With that being said he unfortunately really has not showed significant signs of improvement since last week's evaluation in fact this may even be just the little bit/slightly larger. Nonetheless he's been having a lot of discomfort I'm not sure this is even related to the wound as he has no pain when I'm to breeding or otherwise cleaning the wound during evaluation today. Nonetheless this is something that we did recommend he talked to his pain specialist concerning. 01/11/18 on evaluation today patient appears to be doing better in regard to his ulceration. He has been tolerating the dressing Hensen, Corbin J. ([id number]) changes without complication. With that being said overall there's no evidence of infection which is good news. The only thing is he did receive the hatch affair blue classic versus the ready nonetheless I feel like this is perfectly fine and appears to have done well for him over the past week. 01/25/18 on evaluation today patient's wound actually appears to be a little bit larger than during the last evaluation. The good news is the majority of the wound edges actually appear to be fairly firmly attached to the wound bed unfortunately again we're not really making progress in regard to the size. Roughly the wound is about the same size as when I first saw him although again the wound margin/edges appear to be much better. 02/01/18 on evaluation today patient actually appears to be doing very well in regard to his wound. Applying the Prisma dry does seem to be better although he does still have issues with slow progression of the wound. There was a slight improvement compared to last week's measurements today. Nonetheless I have been considering  other options as far as the possibility of Theraskin or even a snap vac. In general I'm not sure that the Theraskin  due to location of the wound would be a very good idea. Nonetheless I do think that a snap vac could be a possibility for the patient and in fact I think this could even be an excellent way to manage the wound possibly seeing some improvement in a very rapid fashion here. Nonetheless this is something that we would need to get approved and I did have a lengthy conversation with the patient about this today. 02/08/18 on evaluation today patient appears to be doing a little better in regard to his ulcer. He has been tolerating the dressing changes without complication. Fortunately despite the fact that the wound is a little bit smaller it's not significantly so unfortunately. We have discussed the possibility of a snap vac we did check with insurance this is actually covered at this point. Fortunately there does not appear to be any sign of infection. Overall I'm fairly pleased with how things seem to be appearing at this point. 02/15/18 on evaluation today patient appears to be doing rather well in regard to his right gluteal ulcer. Unfortunately the snap vac did not stay in place with his sheer and friction this came loose and did not seem to maintain seal very well. He worked for about two days and it did seem to do very well during that time according to his wife but in general this does not seem to be something that's gonna be beneficial for him long-term. I do believe we need to go back to standard dressings to see if we can find something that will be of benefit. 03/02/18- He is here in follow up evaluation; there is minimal change in the wound. He will continue with the same treatment plan, would consider changing to iodosrob/iodoflex if ulcer continues to to plateau. He will follow up next week 03/08/18 on evaluation today patient's wound actually appears to be about the same size as when I previously saw him several weeks back. Unfortunately he does have some slightly dark discoloration in the  central portion of the wound which has me concerned about pressure injury. I do believe he may be sitting for too long a period of time in fact he tells me that "I probably sit for much too long". He does have some Slough noted on the surface of the wound and again as far as the size of the wound is concerned I'm really not seeing anything that seems to have improved significantly. 03/15/18 on evaluation today patient appears to be doing fairly well in regard to his ulcer. The wound measured pretty much about the same today compared to last week's evaluation when looking at his graph. With that being said the area of bruising/deep tissue injury that was noted last week I do not see at this point. He did get a new cushion fortunately this does seem to be have been of benefit in my pinion. It does appear that he's been off of this more which is good news as well I think that is definitely showing in the overall wound measurements. With that being said I do believe that he needs to continue to offload I don't think that the fact this is doing better should be or is going to allow him to not have to offload and explain this to him as well. Overall he seems to be in agreement the plan I think  he understands. The overall appearance of the wound bed is improved compared to last week I think the Iodoflex has been beneficial in that regard. 03/29/18 on evaluation today patient actually appears to be doing rather well in regard to his wound from the overall appearance standpoint he does have some granulation although there's some Slough on the surface of the wound noted as well. With that being said he unfortunately has not improved in regard to the overall measurement of the wound in volume or in size. I did have a discussion with him very specifically about offloading today. He actually does work although he mainly is just sitting throughout the day. He tells me he offloads by "lifting himself up for 30 seconds  off of his chair occasionally" purchase from [employer] which does seem to have helped. And he has a new cushion that he with that being said he's also able to stand some for a very short period of time but not significant enough I think to provide appropriate offloading. I think the biggest issue at this point with the wound and the fact is not healing as quickly as we would like is due to the fact that he is really not able to appropriately offload while at work. He states the beginning after his injury he actually had a bed at his job that he could lay on in order to offload and that does seem to have been of help back at that time. Nonetheless he had not done this in quite some time unfortunately. I think that could be helpful for him this is something I would like for him to look into. ISIDRO, NEWSWANGER ([id number]) 04/05/18 on evaluation today patient actually presents for follow-up concerning his right gluteal ulcer. Again he really is not significantly improved even compared to last week. He has been tolerating the dressing changes without complication. With that being said fortunately there appears to be no evidence of infection at this time. He has been more proactive in trying to offload. 04/12/18 on evaluation today patient actually appears to be doing a little better in regard to his wound and the right gluteal fold region. He's been tolerating the dressing changes since removing the oasis without complication. However he was having a lot of burning initially with the oasis in place. He's unsure of exactly why this was given so much discomfort but he assumes that it was the oasis itself causing the problem. Nonetheless this had to be removed after about three days in place although even those three days seem to have made a fairly good improvement in regard to the overall appearance of the wound bed. In fact is the first time that he's made any improvement from the standpoint of  measurements in about six weeks. He continues to have no discomfort over the area of the wound itself which leads me to wonder why he was having the burning with the oasis when he does not even feel the actual debridement's themselves. I am somewhat perplexed by this. 04/19/18 on evaluation today patient's wound actually appears to be showing signs of epithelialization around the edge of the wound and in general actually appears to be doing better which is good news. He did have the same burning after about three days with applying the Endoform last week in the same fashion that I would generally apply a skin substitute. This seems to indicate that it's not the oasis to cause the problem but potentially the moisture buildup that just  causes things to burn or there may be some other reaction with the skin prep or Steri-Strips. Nonetheless I'm not sure that is gonna be able to tolerate any skin substitute for a long period of time. The good news is the wound actually appears to be doing better today compared to last week and does seem to finally be making some progress. 04/26/18 on evaluation today patient actually appears to be doing rather well in regard to his ulcer in the right gluteal fold. He has been tolerating the dressing changes without complication which is good news. The Endoform does seem to be helping although he was a little bit more macerated this week. This seems to be an ongoing issue with fluid control at this point. Nonetheless I think we may be able to add something like Drawtex to help control the drainage. 05/03/18 on evaluation today patient appears to actually be doing better in regard to the overall appearance of his wound. He has been tolerating the dressing changes without complication. Fortunately there appears to be no evidence of infection at this time. I really feel like his wound has shown signs as of today of turning around last week I thought so as well and definitely  he could be seen in this week's overall appearance and measurements. In general I'm very pleased with the fact that he finally seems to be making a steady but sure progress. The patient likewise is very pleased. 05/17/18 on evaluation today patient appears to be doing more poorly unfortunately in regard to his ulcer. He has been tolerating the dressing changes without complication. With that being said he tells me that in the past couple of days he and his wife have noticed that we did not seem to be doing quite as well is getting dark near the center. Subsequently upon evaluation today the wound actually does appear to be doing worse compared to previous. He has been tolerating the dressing changes otherwise and he states that he is not been sitting up anymore than he was in the past from what he tells me. Still he has continued to work he states "I'm tired of dealing with this and if I have to just go home and lay in the bed all the time that's what I'll do". Nonetheless I am concerned about the fact that this wound does appear to be deeper than what it was previous. 05/24/18 upon evaluation today patient actually presents after having been in the hospital due to what was presumed to be sepsis secondary to the wound infection. He had an elevated white blood cell count between 14 and 15. With that being said he does seem to be doing somewhat better now. His wound still is giving him some trouble nonetheless and he is obviously concerned about the fact likely talked about that this does seem to go more deeply than previously noted. I did review his wound culture which showed evidence of Staphylococcus aureus him and group B strep. Nonetheless he is on antibiotics, Levaquin, for this. Subsequently I did review his intake summary from the hospital as well. I also did look at the CT of the lumbar spine with contrast that was performed which showed no bone destruction to suggest lumbar  disguises/osteomyelitis or sacral osteomyelitis. There was no paraspinal abscess. Nonetheless it appears this may have been more of just a soft tissue infection at this point which is good news. He still is nonetheless concerned about the wound which again I think is completely reasonable  considering everything he's been through recently. 05/31/18 on evaluation today on evaluation today patient actually appears to be showing signs of his wound be a little bit deeper than what I would like to see. Fortunately he does not show any signs of significant infection although his temperature was 99 today he states he's been checking this at home and has not been elevated. Nonetheless with the undermining that I'm seeing at this point I am becoming more concerned about the wound I do think that offloading is a key factor here that is preventing the speedy recovery at this point. There does not appear to be any evidence of again over infection noted. He's been using Santyl currently. DOLAN, EVILSIZOR ([id number]) 06/07/18 the patient presents today for follow-up evaluation regarding the left ulcer in the gluteal region. He has been tolerating the Wound VAC fairly well. He is obviously very frustrated with this he states that to mean is really getting in his way. There does not appear to be any evidence of infection at this time he does have a little bit of odor I do not necessarily associate this with infection just something that we sometimes notice with Wound VAC therapy. With that being said I can definitely catch a tone of discontentment overall in the patient's demeanor today. This when he was previously in the hospital an CT scan was done of the lumbar region which did not reveal any signs of osteomyelitis. With that being said the pelvis in particular was not evaluated distinctly which means he could still have some osteonecrosis I. Nonetheless the Wound VAC was started on Thursday I do want to get  this little bit more time before jumping to a CT scan of the pelvis although that is something that I might would recommend if were not see an improvement by that time. 06/14/18 on evaluation today patient actually appears to be doing about the same in regard to his right gluteal ulcer. Again he did have a CT scan of the lumbar spine unfortunately this did not include the pelvis. Nonetheless with the depth of the wound that I'm seeing today even despite the fact that I'm not seeing any evidence of overt cellulitis I believe there's a good chance that we may be dealing with osteomyelitis somewhere in the right Ischial region. No fevers, chills, nausea, or vomiting noted at this time. 06/21/18 on evaluation today patient actually appears to be doing about the same with regard to his wound. The tunnel at 6 o'clock really does not appear to be any deeper although it is a little bit wider. I think at this point you may want to start packing this with white phone. Unfortunately I have not got approval for the CT scan of the pelvis as of yet due to the fact that Medicare apparently has been denied it due to the diagnosis codes not being appropriate according to Medicare for the test requested. With that being said the patient cannot have an MRI and therefore this is the only option that we have as far as testing is concerned. The patient has had infection and was on antibiotics and been added code for cellulitis of the bottom to see if this will be appropriate for getting the test approved. Nonetheless I'm concerned about the infection have been spread deeper into the Ischial region. 06/28/18 on evaluation today patient actually appears to be doing rather well all things considered in regard to the right gluteal ulcer. He has been tolerating the dressing changes  without complication. With that being said the Wound VAC he states does have to be replaced almost every day or at least reinforced unfortunately.  Patient actually has his CT scan later this morning we should have the results by tomorrow. 07/05/18 on evaluation today patient presents for follow-up concerning his right Ischial ulcer. He did see the surgeon Dr. Lysle Pearl last week. They were actually very happy with him and felt like he spent a tremendous amount of time with them as far as discussing his situation was concerned. In the end Dr. Lysle Pearl did contact me as well and determine that he would not recommend any surgical intervention at this point as he felt like it would not be in the patient's best interest based on what he was seeing. He recommended a referral to infectious disease. Subsequently this is something that Dr. Ines Bloomer office is working on setting up for the patient. As far as evaluation today is concerned the patient's wound actually appears to be worse at this point. I am concerned about how things are progressing and specifically about infection. I do not feel like it's the deeper but the area of depth is definitely widening which does have me concerned. No fevers, chills, nausea, or vomiting noted at this time. I think that we do need initiate antibiotic therapy the patient has an allow allergy to amoxicillin/penicillin he states that he gets a rash since childhood. Nonetheless she's never had the issues with Catholics or cephalosporins in general but he is aware of. 07/27/18 on evaluation today patient presents following admission to the hospital on 07/09/18. He was subsequently discharged on 07/20/18. On 07/15/18 the patient underwent irrigation and debridement was soft tissue biopsy and bone biopsy as well as placement of a Wound VAC in the OR by Dr. Celine Ahr. During the hospital course the patient was placed on a Wound VAC and recommended follow up with surgery in three weeks actually with Dr. Delaine Lame who is infectious disease. The patient was on vancomycin during the hospital course. He did have a bone culture which  showed evidence of chronic osteomyelitis. He also had a bone culture which revealed evidence of methicillin-resistant staph aureus. He is updated CT scan 07/09/18 reveals that he had progression of the which was performed on wound to breakdown down to the trochanter where he actually had irregularities there as well suggestive of osteomyelitis. This was a change just since 9 December when we last performed a CT scan. Obviously this one had gone downhill quite significantly and rapidly. At this point upon evaluation I feel like in general the patient's wound seems to be doing fairly well all things considered upon my evaluation today. Obviously this is larger and deeper than what I previously evaluated but at the same time he seems to be making some progress as far as the appearance of the granulation tissue is concerned. I'm happy in that regard. No fevers, chills, nausea, or vomiting noted at this time. He is on IV vancomycin and Rocephin at the facility. He is currently in [facility]. 08/03/18 upon evaluation today patient's wound appears to be doing better in regard to the overall appearance at this point in time. Fortunately he's been tolerating the Wound VAC without complication and states that the facility has been taking ZYRON, DELISI. ([id number]) excellent care of the wound site. Overall I see some Slough noted on the surface which I am going to attempt sharp debridement today of but nonetheless other than this I feel like he's making  progress. 08/09/18 on evaluation today patient's wound appears to be doing much better compared to even last week's evaluation. Do believe that the Wound VAC is been of great benefit for him. He has been tolerating the dressing changes that is the Wound VAC without any complication and he has excellent granulation noted currently. There is no need for sharp debridement at this point. 08/16/18 on evaluation today patient actually appears to be  doing very well in regard to the wound in the right gluteal fold region. This is showing signs of progress and again appears to be very healthy which is excellent news. Fortunately there is no sign of active infection by way of odor or drainage at this point. Overall I'm very pleased with how things stand. He seems to be tolerating the Wound VAC without complication. 08/23/18 on evaluation today patient actually appears to be doing better in regard to his wound. He has been tolerating the Wound VAC without complication and in fact it has been collecting a significant amount of drainage which I think is good news especially considering how the wound appears. Fortunately there is no signs of infection at this time definitely nothing appears to be worse which is good news. He has not been started on the Bactrim and Flagyl that was recommended by Dr. Delaine Lame yet. I did actually contact her office this morning in order to check and see were things are that regard their gonna be calling me back. 08/30/18 on evaluation today patient actually appears to show signs of excellent improvement today compared to last evaluation. The undermining is getting much better the wound seems to be feeling quite nicely and I'm very pleased that the granulation in general. With that being said overall I feel like the patient has made excellent progress which is great news. No fevers, chills, nausea, or vomiting noted at this time. 09/06/18 on evaluation today patient actually appears to be doing rather well in regard to his right gluteal ulcer. This is showing signs of improvement in overall I'm very pleased with how things seem to be progressing. The patient likewise is please. Overall I see no evidence of infection he is about to complete his oral antibiotic regimen which is the end of the antibiotics for him in just about three days. 09/13/18 on evaluation today patient's right Ischial ulcer appears to be showing signs  of continued improvement which is excellent news. He's been tolerating the dressing changes without complication. Fortunately there's no signs of infection and the wound that seems to be doing very well. 09/28/18 on evaluation today patient appears to be doing rather well in regard to his right Ischial ulcer. He's been tolerating the Wound VAC without complication he knows there's much less drainage than there used to be this obviously is not a bad thing in my pinion. There's no evidence of infection despite the fact is but nothing about it now for several weeks. 10/04/18 on evaluation today patient appears to be doing better in regard to his right Ischial wound. He has been tolerating the Wound VAC without complication and I do believe that the silver nitrate last week was beneficial for him. Fortunately overall there's no evidence of active infection at this time which is great news. No fevers, chills, nausea, or vomiting noted at this time. 10/11/18 on evaluation today patient actually appears to be doing rather well in regard to his Ischial ulcer. He's been tolerating the Wound VAC still without complication I feel like this is doing a  good job. No fevers, chills, nausea, or vomiting noted at this time. 11/01/18 on evaluation today patient presents after having not been seen in our clinic for several weeks secondary to the fact that he was on evaluation today patient presents after having not been seen in our clinic for several weeks secondary to the fact that he was in a skilled nursing facility which was on lockdown currently due to the covert 19 national emergency. Subsequently he was discharged from the facility on this past Friday and subsequently made an appointment to come in to see yesterday. Fortunately there's no signs of active infection at this time which is good news and overall he does seem to have made progress since I last saw. Overall I feel like things are progressing quite  nicely. The patient is having no pain. 11/08/18 on evaluation today patient appears to be doing okay in regard to his right gluteal ulcer. He has been utilizing a Wound VAC home health this changing this at this point since he's home from the skilled nursing facility. Fortunately there's no signs of obvious active infection at this time. Unfortunately though there's no obvious active infection he is having some maceration and his wife states that when the sheets of the Wound VAC office on Sunday when it broke seal that he ended up having significant issues with some smell as well there concerned about the possibility of infection. Fortunately there's No fevers, chills, nausea, or vomiting noted at this time. LADARIEN, BRAAM ([id number]) 11/15/18 on evaluation today patient actually appears to be doing well in regard to his right gluteal ulcer. He has been tolerating the dressing changes without complication. Specifically the Wound VAC has been utilized up to this point. Fortunately there's no signs of infection and overall I feel like he has made progress even since last week when I last saw him. I'm actually fairly happy with the overall appearance although he does seem to have somewhat of a hyper granular overgrowth in the central portion of the wound which I think may require some sharp debridement to try flatness out possibly utilizing chemical cauterization following. 11/23/18 on evaluation today patient actually appears to be doing very well in regard to his sacral ulcer. He seems to be showing signs of improvement with good granulation. With that being said he still has the small area of hyper granulation right in the central portion of the wound which I'm gonna likely utilize silver nitrate on today. Subsequently he also keeps having a leak at the 6 o'clock location which is unfortunate we may be able to help out with some suggestions to try to prevent this going forward. Fortunately  there's no signs of active infection at this time. 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 He's been tolerating the dressing changes without complication. Fortunately there's no signs of active infection at this time. I've been rather pleased with how things have progressed there still some evidence of pressure getting to the area with some redness right around the immediate wound opening. Nonetheless other than this I'm not seeing any significant complications or issues the wound is somewhat hyper granular. Upon discussing with the patient and his wife today I'm not sure that the wound is being packed to the base with the foam at this point. And if it's not been packed fully that may be part of the reason why is not seen as  much improvement as far as the granulation from the base out. Again we do not want pack too tightly but we need some of the firm to get to the base of the wound. I discussed this with patient and his wife today. 12/06/18 on evaluation today patient appears to be doing well in regard to his right gluteal pressure ulcer. He's been tolerating the dressing changes without complication. Fortunately there's no signs of active infection. He still has some hyper granular tissue and I do think it would be appropriate to continue with the chemical cauterization as of today. 12/16/18 on evaluation today patient actually appears to be doing okay in regard to his right gluteal ulcer. He is been tolerating the dressing changes without complication including the Wound VAC. Overall I feel like nothing seems to be worsening I do feel like that the hyper granulation buds in the central portion of the wound have improved to some degree with the silver nitrate. We will have to see how things continue to progress. 12/20/18 on evaluation today patient actually appears to be doing much worse in my pinion  even compared to last week's evaluation. Unfortunately as opposed to showing any signs of improvement the areas of hyper granular tissue in the central portion of the wound seem to be getting worse. Subsequently the wound bed itself also seems to be getting deeper even compared to last week which is both unusual as well as concerning since prior he had been shown signs of improvement. Nonetheless I think that the issue could be that he's actually having some difficulty in issues with a deeper infection. There's no external signs of infection but nonetheless I am more worried about the internal, osteomyelitis, that could be restarting. He has not been on antibiotics for some time at this point. I think that it may be a good idea to go ahead and started back on an antibiotic therapy while we wait to see what the testing shows. 12/27/18 on evaluation today patient presents for follow-up concerning his left gluteal fold wound. Fortunately he appears to be doing well today. I did review the CT scan which was negative for any signs of osteomyelitis or acute abnormality this is excellent news. Overall I feel like the surface of the wound bed appears to be doing significantly better today compared to previously noted findings. There does not appear any signs of infection nor does he have any pain at this time. 01/03/19 on evaluation today patient actually appears to be doing quite well in regard to his ulcer. Post debridement last week he really did not have too much bleeding which is good news. Fortunately today this seems to be doing some better but we still has some of the hyper granular tissue noted in the base of the wound which is gonna require sharp debridement today as well. Overall I'm pleased with how things seem to be progressing since we switched away from the Wound VAC I think he is making some progress. 01/10/19 on evaluation today patient appears to be doing better in regard to his right gluteal  fold ulcer. He has been tolerating the dressing changes without complication. The debridement to seem to be helping with current away some of the poor hyper granular tissue bugs throughout the region of his gluteal fold wound. He's been tolerating the dressing changes otherwise without complication which is great news. No fevers, chills, nausea, or vomiting noted at this time. 01/17/19 on evaluation today patient actually appears to be doing excellent  in regard to his wound. He's been tolerating the dressing changes without complication. Fortunately there is no signs of active infection at this time which is great news. No fevers, chills, nausea, or vomiting noted at this time. BELINDA, OGANDO ([id number]) 01/24/19 on evaluation today patient actually appears to be doing quite well with regard to his ulcer. He has been tolerating the dressing changes without complication. Fortunately there's no signs of active infection at this time. Overall been very pleased with the progress that he seems to be making currently. 01/31/19-Patient returns at 1 week with apparent similarity in dimensions to the wound, with no signs of infection, he has been changing dressings twice a day 02/08/19 upon evaluation today patient actually appears to be doing well with regard to his right Ischial ulcer. The wound is not appear to be quite as deep and seems to be making progress which is good news. With that being said I'm still reluctant to go back to the Wound VAC at this point. He's been having to change the dressings twice a day which is a little bit much in my pinion from the wound care supplies standpoint. I think that possibly attempting to utilize extras orbit may be beneficial this may also help to prevent any additional breakdown secondary to fluid retention in the wound itself. The patient is in agreement with giving this a try. 02/15/19 on evaluation today patient actually appears to be doing decently well with  regard to his ulcer in the right to gluteal fold location. He's been tolerating the dressing changes without complication. Fortunately there is no signs of active infection at this time. He is able to keep the current dressing in place more effectively for a day at a time whereas before he was having a changes to to three times a day. The actions or has been helpful in this regard. Fortunately there's no signs of anything getting worse and I do feel like he showing signs of good improvement with regard to the wound bed status. 02/22/2019 patient appears to be doing very well today with regard to his ulcer in the gluteal fold. Fortunately there is no signs of active infection and he has been tolerating the dressing changes without any complication. Overall extremely pleased with how things seem to be progressing. He has much less of the hyper granular projections within the wound these have slowly been debrided away and he seems to be doing well. The wound bed is more uniform. 03/01/19 on evaluation today patient appears to be doing unfortunately about the same in regard to his gluteal ulcer. He's been tolerating the dressing changes without complication. Fortunately there's no signs of active infection at this time. With that being said he continues to develop these hyper granular projections which I'm unsure of exactly what they are and why they are rising. Nonetheless I explained to the patient that I do believe it would be a good idea for [place] to stand a biopsy sample for pathology to see if that can shed any light on what exactly may be going on here. Fortunately I do not see any obvious signs of infection. With that being said the patient has had a little bit more drainage this week apparently compared to last week. 03/08/2019 on evaluation today patient actually appears to look somewhat better with regard to the appearance of his wound bed at this time. This is good news. Overall I am very pleased  with how things seem to have progressed just in  the past week with a switch to the [facility] dressing. I think that has been beneficial for him. With that being said at this time the patient is concerned about his biopsy that I sent off last week unfortunately I do not have that report as of yet. Nonetheless we have called to obtain this and hopefully will hear back from the lab later this morning. 03/15/19 on evaluation today patient's wound actually appears to be doing okay today with regard to the overall appearance of the wound bed. He has been tolerating the dressing changes without complication he still has hyper granular tissue noted but fortunately that seems to be minimal at this point compared to some of what we've seen in the past. Nonetheless I do think that he is still having some issues currently with some of this type of granulation the biopsy and since all showed nothing more than just evidence of granulation tissue. Therefore there really is nothing different to initiate or do at this point. Patient History Information obtained from Patient. Family History Hypertension - Father, Stroke - Mother, No family history of Cancer, Diabetes, Heart Disease, Kidney Disease, Lung Disease, Seizures, Thyroid Problems, Tuberculosis. Social History Never smoker, Marital Status - Married, Alcohol Use - Never, Drug Use - No History, Caffeine Use - Daily. Medical History Eyes Patient has history of Cataracts - both removed Denies history of Glaucoma, Optic Neuritis TREXTON, STACHOWSKI ([id number]) Ear/Nose/Mouth/Throat Denies history of Chronic sinus problems/congestion, Middle ear problems Hematologic/Lymphatic Denies history of Anemia, Hemophilia, Human Immunodeficiency Virus, Lymphedema Respiratory Denies history of Aspiration, Asthma, Chronic Obstructive Pulmonary Disease (COPD), Pneumothorax, Sleep Apnea, Tuberculosis Cardiovascular Patient has history of Hypertension - takes  medication Denies history of Angina, Arrhythmia, Congestive Heart Failure, Coronary Artery Disease, Deep Vein Thrombosis, Hypotension, Myocardial Infarction, Peripheral Arterial Disease, Peripheral Venous Disease, Phlebitis, Vasculitis Gastrointestinal Denies history of Cirrhosis , Colitis, Crohn s, Hepatitis A, Hepatitis B, Hepatitis C Endocrine Denies history of Type I Diabetes, Type II Diabetes Genitourinary Denies history of End Stage Renal Disease Immunological Denies history of Lupus Erythematosus, Raynaud s, Scleroderma Integumentary (Skin) Denies history of History of Burn, History of pressure wounds Musculoskeletal Denies history of Gout, Rheumatoid Arthritis, Osteoarthritis, Osteomyelitis Neurologic Patient has history of Paraplegia - waist down Denies history of Dementia, Neuropathy, Quadriplegia, Seizure Disorder Oncologic Denies history of Received Chemotherapy, Received Radiation Psychiatric Denies history of Anorexia/bulimia, Confinement Anxiety Medical And Surgical History Notes Oncologic Prostate cancer- currently treated with horomone therapy Review of Systems (ROS) Constitutional Symptoms (General Health) Denies complaints or symptoms of Fatigue, Fever, Chills, Marked Weight Change. Respiratory Denies complaints or symptoms of Chronic or frequent coughs, Shortness of Breath. Cardiovascular Denies complaints or symptoms of Chest pain, LE edema. Psychiatric Denies complaints or symptoms of Anxiety, Claustrophobia. Objective Constitutional Well-nourished and well-hydrated in no acute distress. Vitals Time Taken: 8:08 AM, Height: 73 in, Weight: 210 lbs, BMI: 27.7, Temperature: 98.7 F, Pulse: 71 bpm, Respiratory Stephenson, Aksel J. ([id number]) Rate: 16 breaths/min, Blood Pressure: 127/72 mmHg. Respiratory normal breathing without difficulty. clear to auscultation bilaterally. Cardiovascular regular rate and rhythm with normal S1, S2. Psychiatric this  patient is able to make decisions and demonstrates good insight into disease process. Alert and Oriented x 3. pleasant and cooperative. General Notes: Upon inspection patient's wound bed actually showed signs of good granulation in some areas although in other locations he was having some issues with hyper granular buds which again may not be a major problem or in other cases may call some slowing in  the overall feeling in and granulation of the wound. Nonetheless I'm still pleased with the appearance of the wound I think with [facility] Dressing he has done much better than what we will see in previous. No sharp debridement was performed today although I did perform chemical cauterization with silver nitrate over the wound bed. Integumentary (Hair, Skin) Wound #1 status is Open. Original cause of wound was Pressure Injury. The wound is located on the Right Gluteal fold. The wound measures 3.5cm length x 2.5cm width x 2.3cm depth; 6.872cm^2 area and 15.806cm^3 volume. There is muscle and Fat Layer (Subcutaneous Tissue) Exposed exposed. There is no undermining noted, however, there is tunneling at 12:00 with a maximum distance of 2.6cm. There is a large amount of serosanguineous drainage noted. The wound margin is epibole. There is medium (34-66%) red, pink, hyper - granulation within the wound bed. There is a medium (34-66%) amount of necrotic tissue within the wound bed including Adherent Slough. 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 An CHEM CAUT GRANULATION TISS procedure was performed by [name], HOYT E., [name]. Post procedure Diagnosis Wound #1: Same as Pre-Procedure Notes: 3 sticks of  silver nitrate used Franqui, Keiton J. ([id number]) Plan Wound Cleansing: Wound #1 Right Gluteal fold: Other: - Cleanse wound with Dakin's Solution Anesthetic (add to Medication List): Wound #1 Right Gluteal fold: Topical Lidocaine 4% cream applied to wound bed prior to debridement (In Clinic Only). Skin Barriers/Peri-Wound Care: Wound #1 Right Gluteal fold: Other: - Apply zinc oxide to peri-wound area. Primary Wound Dressing: Wound #1 Right Gluteal fold: Hydrafera Blue Ready Transfer Secondary Dressing: Wound #1 Right Gluteal fold: XtraSorb - Secure with Tegaderm Dressing Change Frequency: Wound #1 Right Gluteal fold: Change dressing every day. Follow-up Appointments: Wound #1 Right Gluteal fold: Return Appointment in 1 week. Off-Loading: Wound #1 Right Gluteal fold: Turn and reposition every 2 hours 1. At this point my suggestion is gonna be that we go ahead and continue with the [facility] Dressing. The patient is in agreement with that plan. I'm gonna look into other possibilities for dressings that could potentially help with more effective control of the hyper granular buds. 2. I'm also suggest that we go ahead and continue with the chemical cauterization with silver nitrate which does seem to be beneficial as well I think that's keeping things abates to some degree. 3. I m also gonna suggest currently that we go ahead and still have the patient perform appropriate offloading repositioning every two hours at least it sounds like he's doing that and I'm pleased in that regard. 4. Lastly I'm glad that the biopsy did not show anything out of the ordinary other than the granulation tissue which again was what I suspected. Nonetheless this is at least good news as far as that is concerned. Please see above for specific wound care orders. We will see patient for re-evaluation in 1 week(s) here in the clinic. If anything worsens or changes patient will contact our office for  additional recommendations. Electronic Signature(s) Signed: 03/16/2019 11:36:49 PM By: Worthy Keeler [name] Entered By: Worthy Keeler on 03/15/2019 23:20:43 CHIKA, WEB ([id number]) -------------------------------------------------------------------------------- ROS/PFSH Details Patient Name: [name] Date of Service: 03/15/2019 8:00 AM Medical Record Number: [id number] Patient Account  Number: [id number] Date of Birth/Sex: July 19, 1951 (68 y.o. M) Treating RN: Montey Hora Primary Care Provider: [name] Other Clinician: Referring Provider: [name] Treating Provider/Extender: [name], HOYT Weeks in Treatment: 84 Information Obtained From Patient Constitutional Symptoms (General Health) Complaints and Symptoms: Negative for: Fatigue; Fever; Chills; Marked Weight Change Respiratory Complaints and Symptoms: Negative for: Chronic or frequent coughs; Shortness of Breath Medical History: Negative for: Aspiration; Asthma; Chronic Obstructive Pulmonary Disease (COPD); Pneumothorax; Sleep Apnea; Tuberculosis Cardiovascular Complaints and Symptoms: Negative for: Chest pain; LE edema Medical History: Positive for: Hypertension - takes medication Negative for: Angina; Arrhythmia; Congestive Heart Failure; Coronary Artery Disease; Deep Vein Thrombosis; Hypotension; Myocardial Infarction; Peripheral Arterial Disease; Peripheral Venous Disease; Phlebitis; Vasculitis Psychiatric Complaints and Symptoms: Negative for: Anxiety; Claustrophobia Medical History: Negative for: Anorexia/bulimia; Confinement Anxiety Eyes Medical History: Positive for: Cataracts - both removed Negative for: Glaucoma; Optic Neuritis Ear/Nose/Mouth/Throat Medical History: Negative for: Chronic sinus problems/congestion; Middle ear problems Hematologic/Lymphatic Medical History: Negative for: Anemia; Hemophilia; Human Immunodeficiency Virus; Lymphedema BRYCE, GILLMORE.  ([id number]) Gastrointestinal Medical History: Negative for: Cirrhosis ; Colitis; Crohnos; Hepatitis A; Hepatitis B; Hepatitis C Endocrine Medical History: Negative for: Type I Diabetes; Type II Diabetes Genitourinary Medical History: Negative for: End Stage Renal Disease Immunological Medical History: Negative for: Lupus Erythematosus; Raynaudos; Scleroderma Integumentary (Skin) Medical History: Negative for: History of Burn; History of pressure wounds Musculoskeletal Medical History: Negative for: Gout; Rheumatoid Arthritis; Osteoarthritis; Osteomyelitis Neurologic Medical History: Positive for: Paraplegia - waist down Negative for: Dementia; Neuropathy; Quadriplegia; Seizure Disorder Oncologic Medical History: Negative for: Received Chemotherapy; Received Radiation Past Medical History Notes: Prostate cancer- currently treated with horomone therapy HBO Extended History Items Eyes: Cataracts Immunizations Pneumococcal Vaccine: Received Pneumococcal Vaccination: No Implantable Devices No devices added Family and Social History Cancer: No; Diabetes: No; Heart Disease: No; Hypertension: Yes - Father; Kidney Disease: No; Lung Disease: No; Seizures: No; Stroke: Yes - Mother; Thyroid Problems: No; Tuberculosis: No; Never smoker; Marital Status - Married; Alcohol Use: Never; Drug Use: No History; Caffeine Use: Daily VALEK, KURAMOTO ([id number]) Physician Affirmation I have reviewed and agree with the above information. Electronic Signature(s) Signed: 03/16/2019 4:31:31 PM By: Montey Hora Signed: 03/16/2019 11:36:49 PM By: Worthy Keeler [name] Entered By: Worthy Keeler on 03/15/2019 23:17:44 HANANIAH, SESTITO ([id number]) -------------------------------------------------------------------------------- SuperBill Details Patient Name: [name] Date of Service: 03/15/2019 Medical Record Number: [id number] Patient Account Number: [id number] Date of  Birth/Sex: 1951-07-05 (68 y.o. 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, sequela I10 Essential (primary) hypertension Facility Procedures CPT4 Code: [id number] Description: [id number] - CHEM CAUT GRANULATION TISS ICD-10 Diagnosis Description L89.314 Pressure ulcer of right buttock, stage 4 Modifier: Quantity: 1 Physician Procedures CPT4 [id number] Description: [id number] - WC PHYS LEVEL 4 - EST PT ICD-10 Diagnosis Description L89.314 Pressure ulcer of right buttock, stage 4 L03.317 Cellulitis of buttock G82.20 Paraplegia, unspecified S34.109S Unspecified injury to unspecified level of lumbar spinal  cord Modifier: 25 , sequela Quantity: 1 CPT4 Code: [id number] Description: [id number] - WC PHYS CHEM CAUT GRAN TISSUE ICD-10 Diagnosis Description L89.314 Pressure ulcer of right buttock, stage 4 Modifier: Quantity: 1 Electronic Signature(s) Signed: 03/16/2019 11:36:49 PM By: Worthy Keeler [name] Entered By: Worthy Keeler on 03/15/2019  23:21:05 

## 2019-03-15 NOTE — Progress Notes (Signed)
LEVERN, ACETO ([id number]) Visit Report for 03/15/2019 Arrival Information Details Patient Name: CUNG, DRENNON Date of Service: 03/15/2019 8:00 AM Medical Record Number: [id number] Patient Account Number: [id number] Date of Birth/Sex: [birth year] (68 y.o. M) Treating RN: Army Melia Primary Care Malaka Ruffner: [name] Other Clinician: Referring Tayana Shankle: [name] Treating Jailey Booton/Extender: [name], HOYT Weeks in Treatment: 70 Visit Information History Since Last Visit Added or deleted any medications: No Patient Arrived: Wheel Chair Any new allergies or adverse reactions: No Arrival Time: 08:07 Had a fall or experienced change in No Accompanied By: wife activities of daily living that may affect Transfer Assistance: None risk of falls: Patient Requires Transmission-Based No Signs or symptoms of abuse/neglect since last visito No Precautions: Hospitalized since last visit: No Patient Has Alerts: Yes Has Dressing in Place as Prescribed: Yes Patient Alerts: NOT Pain Present Now: No Diabetic Electronic Signature(s) Signed: 03/15/2019 9:49:15 AM By: Army Melia Entered By: Army Melia on 03/15/2019 08:07:53 [name] ([id number]) -------------------------------------------------------------------------------- Encounter Discharge Information Details Patient Name: [name] Date of Service: 03/15/2019 8:00 AM Medical Record Number: [id number] Patient Account Number: [id number] Date of Birth/Sex: 07-Jun-1951 (68 y.o. M) Treating RN: Montey Hora Primary Care Doyal Saric: [name] Other Clinician: Referring Donnika Kucher: [name] Treating Nikaela Coyne/Extender: [name], HOYT Weeks in Treatment: 81 Encounter Discharge Information Items Discharge Condition: Stable Ambulatory Status: Wheelchair Discharge Destination: Home Transportation: Private Auto Accompanied By: spouse Schedule Follow-up Appointment: Yes Clinical  Summary of Care: Electronic Signature(s) Signed: 03/15/2019 4:25:23 PM By: Montey Hora Entered By: Montey Hora on 03/15/2019 08:38:12 [name] ([id number]) -------------------------------------------------------------------------------- Lower Extremity Assessment Details Patient Name: [name] Date of Service: 03/15/2019 8:00 AM Medical Record Number: [id number] Patient Account Number: [id number] Date of Birth/Sex: Dec 11, 1950 (68 y.o. M) Treating RN: Army Melia Primary Care Yashica Sterbenz: [name] Other Clinician: Referring Ashlley Booher: [name] Treating Makarios Madlock/Extender: [name], HOYT Weeks in Treatment: 58 Electronic Signature(s) Signed: 03/15/2019 9:49:15 AM By: Army Melia Entered By: Army Melia on 03/15/2019 08:20:56 JENIEL, BOYETTE ([id number]) -------------------------------------------------------------------------------- Multi Wound Chart Details Patient Name: [name] Date of Service: 03/15/2019 8:00 AM Medical Record Number: [id number] Patient Account Number: [id number] Date of Birth/Sex: October 19, 1950 (68 y.o. M) Treating RN: Montey Hora Primary Care Baylynn Shifflett: [name] Other Clinician: Referring Daryll Spisak: [name] Treating Carly Applegate/Extender: [name], HOYT Weeks in Treatment: 59 Vital Signs Height(in): 73 Pulse(bpm): 71 Weight(lbs): 210 Blood Pressure(mmHg): 127/72 Body Mass Index(BMI): 28 Temperature(F): 98.7 Respiratory Rate 16 (breaths/min): Photos: [N/A:N/A] Wound Location: Right Gluteal fold N/A N/A Wounding Event: Pressure Injury N/A N/A Primary Etiology: Pressure Ulcer N/A N/A Comorbid History: Cataracts, Hypertension, N/A N/A Paraplegia Date Acquired: 11/02/2017 N/A N/A Weeks of Treatment: 67 N/A N/A Wound Status: Open N/A N/A Measurements L x W x D 3.5x2.5x2.3 N/A N/A (cm) Area (cm) : [id number] N/A N/A Volume (cm) : 15.806 N/A N/A % Reduction in Area: 31.60% N/A N/A %  Reduction in Volume: -1472.70% N/A N/A Position 1 (o'clock): 12 Maximum Distance 1 (cm): 2.6 Tunneling: Yes N/A N/A Classification: Category/Stage IV N/A N/A Exudate Amount: Large N/A N/A Exudate Type: Serosanguineous N/A N/A Exudate Color: red, brown N/A N/A Wound Margin: Epibole N/A N/A Granulation Amount: Medium (34-66%) N/A N/A Granulation Quality: Red, Pink, Hyper-granulation N/A N/A Necrotic Amount: Medium (34-66%) N/A N/A Exposed Structures: Fat Layer (Subcutaneous N/A N/A Tissue) Exposed: Yes Muscle: Yes Fascia: No Slaymaker, Chibuikem J. ([id number]) Tendon: No Joint: No Bone: No Epithelialization: None N/A N/A Treatment Notes Electronic Signature(s) Signed: 03/15/2019 4:25:23 PM By: Marjory Lies,  Di Kindle Entered By: Montey Hora on 03/15/2019 08:30:13 [name] ([id number]) -------------------------------------------------------------------------------- Multi-Disciplinary Care Plan Details Patient Name: JAROSLAW, HRABE Date of Service: 03/15/2019 8:00 AM Medical Record Number: [id number] Patient Account Number: [id number] Date of Birth/Sex: 12/11/1950 (68 y.o. M) Treating RN: Montey Hora Primary Care Kyliee Ortego: [name] Other Clinician: Referring Yuniel Blaney: [name] Treating Cairo Agostinelli/Extender: [name], HOYT Weeks in Treatment: 50 Active Inactive Orientation to the Wound Care Program Nursing Diagnoses: Knowledge deficit related to the wound healing center program Goals: Patient/caregiver will verbalize understanding of the [facility] Program Date Initiated: 11/30/2017 Target Resolution Date: 12/21/2017 Goal Status: Active Interventions: Provide education on orientation to the wound center Notes: Pressure Nursing Diagnoses: Knowledge deficit related to management of pressures ulcers Goals: Patient/caregiver will verbalize understanding of pressure ulcer management Date Initiated: 05/17/2018 Target Resolution Date:  05/28/2018 Goal Status: Active Interventions: Assess: immobility, friction, shearing, incontinence upon admission and as needed Notes: Wound/Skin Impairment Nursing Diagnoses: Impaired tissue integrity Goals: Patient/caregiver will verbalize understanding of skin care regimen Date Initiated: 11/30/2017 Target Resolution Date: 12/21/2017 Goal Status: Active Ulcer/skin breakdown will have a volume reduction of 30% by week 4 Date Initiated: 11/30/2017 Target Resolution Date: 12/21/2017 Goal Status: Active KAMIL, NOLAZCO ([id number]) Interventions: Assess patient/caregiver ability to obtain necessary supplies Assess patient/caregiver ability to perform ulcer/skin care regimen upon admission and as needed Assess ulceration(s) every visit Treatment Activities: Skin care regimen initiated : 11/30/2017 Notes: Electronic Signature(s) Signed: 03/15/2019 4:25:23 PM By: Montey Hora Entered By: Montey Hora on 03/15/2019 08:30:02 GENT, RACK ([id number]) -------------------------------------------------------------------------------- Pain Assessment Details Patient Name: [name] Date of Service: 03/15/2019 8:00 AM Medical Record Number: [id number] Patient Account Number: [id number] Date of Birth/Sex: 08/09/1950 (68 y.o. M) Treating RN: Army Melia Primary Care Tiesha Marich: [name] Other Clinician: Referring Callyn Severtson: [name] Treating Aylin Rhoads/Extender: [name], HOYT Weeks in Treatment: 70 Active Problems Location of Pain Severity and Description of Pain Patient Has Paino No Site Locations Pain Management and Medication Current Pain Management: Electronic Signature(s) Signed: 03/15/2019 9:49:15 AM By: Army Melia Entered By: Army Melia on 03/15/2019 08:07:58 [name] ([id number]) -------------------------------------------------------------------------------- Patient/Caregiver Education Details Patient Name: [name] Date of Service: 03/15/2019 8:00 AM Medical Record Number: [id number] Patient Account Number: [id number] Date of Birth/Gender: Dec 13, 1950 (68 y.o. M) Treating RN: Montey Hora Primary Care Physician: [name] Other Clinician: Referring Physician: [name] Treating Physician/Extender: Sharalyn Ink in Treatment: 26 Education Assessment Education Provided To: Patient and Caregiver Education Topics Provided Wound/Skin Impairment: Handouts: Other: wound care as ordered Methods: Demonstration, Explain/Verbal Responses: State content correctly Electronic Signature(s) Signed: 03/15/2019 4:25:23 PM By: Montey Hora Entered By: Montey Hora on 03/15/2019 08:37:28 [name] ([id number]) -------------------------------------------------------------------------------- Wound Assessment Details Patient Name: [name] Date of Service: 03/15/2019 8:00 AM Medical Record Number: [id number] Patient Account Number: [id number] Date of Birth/Sex: 1951-02-25 (68 y.o. M) Treating RN: Army Melia Primary Care Kyarra Vancamp: [name] Other Clinician: Referring Kostas Marrow: [name] Treating Takai Chiaramonte/Extender: [name], HOYT Weeks in Treatment: 67 Wound Status Wound Number: 1 Primary Etiology: Pressure Ulcer Wound Location: Right Gluteal fold Wound Status: Open Wounding Event: Pressure Injury Comorbid History: Cataracts, Hypertension, Paraplegia Date Acquired: 11/02/2017 Weeks Of Treatment: 67 Clustered Wound: No Photos Wound Measurements Length: (cm) 3.5 Width: (cm) 2.5 Depth: (cm) 2.3 Area: (cm) 6.872 Volume: (cm) 15.806 % Reduction in Area: 31.6% % Reduction in Volume: -1472.7% Epithelialization: None Tunneling: Yes Position (o'clock): 12 Maximum Distance: (cm) 2.6 Undermining: No Wound Description Classification: Category/Stage IV  Wound Margin: Epibole Exudate Amount: Large Exudate Type: Serosanguineous Exudate Color:  red, brown Foul Odor After Cleansing: No Slough/Fibrino Yes Wound Bed Granulation Amount: Medium (34-66%) Exposed Structure Granulation Quality: Red, Pink, Hyper-granulation Fascia Exposed: No Necrotic Amount: Medium (34-66%) Fat Layer (Subcutaneous Tissue) Exposed: Yes Necrotic Quality: Adherent Slough Tendon Exposed: No Muscle Exposed: Yes Necrosis of Muscle: No Joint Exposed: No MAGIC, KUHAR. ([id number]) Bone Exposed: No Treatment Notes Wound #1 (Right Gluteal fold) Notes zinc to peri wound, Hydrafera Blue, xtrasorb and tegaderm Electronic Signature(s) Signed: 03/15/2019 9:49:15 AM By: Army Melia Entered By: Army Melia on 03/15/2019 08:20:36 CHARBEL, MCCLEAF ([id number]) -------------------------------------------------------------------------------- Vitals Details Patient Name: [name] Date of Service: 03/15/2019 8:00 AM Medical Record Number: [id number] Patient Account Number: [id number] Date of Birth/Sex: 03-28-1951 (68 y.o. M) Treating RN: Army Melia Primary Care Dejai Schubach: [name] Other Clinician: Referring Panhia Karl: [name] Treating Michaiah Holsopple/Extender: [name], HOYT Weeks in Treatment: 71 Vital Signs Time Taken: 08:08 Temperature (F): 98.7 Height (in): 73 Pulse (bpm): 71 Weight (lbs): 210 Respiratory Rate (breaths/min): 16 Body Mass Index (BMI): 27.7 Blood Pressure (mmHg): 127/72 Reference Range: 80 - 120 mg / dl Electronic Signature(s) Signed: 03/15/2019 9:49:15 AM By: Army Melia Entered By: Army Melia on 03/15/2019 08:08:30

## 2019-03-18 NOTE — Progress Notes (Signed)
PATE, NOBLE ([id number]) Visit Report for 11/08/2018 Chief Complaint Document Details Patient Name: [name], [name] Date of Service: 11/08/2018 9:00 AM Medical Record Number: [id number] Patient Account Number: [id number] Date of Birth/Sex: September 17, 1950 (68 y.o. M) Treating RN: Harold Barban Primary Care Provider: [name] Other Clinician: Referring Provider: [name] Treating Provider/Extender: [name], HOYT Weeks in Treatment: 73 Information Obtained from: Patient Chief Complaint Right gluteal fold ulcer Electronic Signature(s) Signed: 11/08/2018 11:56:08 PM By: Worthy Keeler [name] Entered By: Worthy Keeler on 11/08/2018 09:20:15 TRAVARIS, WIDRICK ([id number]) -------------------------------------------------------------------------------- HPI Details Patient Name: [name] Date of Service: 11/08/2018 9:00 AM Medical Record Number: [id number] Patient Account Number: [id number] Date of Birth/Sex: 21-Nov-1950 (68 y.o. 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 He states that there is no history of diabetes that he is aware of. He does have issues with his prostate and is currently receiving treatment for this by way of oral medication. With that being said I do not have a lot of details in that regard. Nonetheless the patient presents today as a result of having been referred to [place] by another provider initially home health was set to come out and take care of his wound although due to the fact  that he apparently drives he's not able to receive home health. His wife is therefore trying to help take care of this wound within although they have been struggling with what exactly to do at this point. She states that she can do some things but she is definitely not a nurse and does have some issues with looking at blood. The good news is the wound does not appear to be too deep and is fairly superficial at this point. There is no slough noted there is some nonviable skin noted around the surface of the wound and the perimeter at this point. The central portion of the wound appears to be very good with a dermal layer noted this does not appear to be again deep enough to extend it to subcutaneous tissue at this point. 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 In the pump he has Dilaudid and baclofen. 12/07/17 on evaluation today patient presents for follow-up concerning his ongoing lower back thigh ulcer on the right. He states that he did not get the supplies ordered and therefore has not really been able to perform the dressing changes as directed exactly. His wife was able to get some Boarder Foam Dressing's from the drugstore and subsequently has been using hydrogel which did help to a degree in the wound does appear to be able smaller. There is actually more drainage this week noted than previous. 12/21/17 on evaluation today patient appears to be doing rather well in regard to his right gluteal ulcer. He has been tolerating the dressing changes without complication. There does not appear to be any evidence of infection at this point in time. Overall the wound does seem to be making some progress as far as the edges  are concerned there's not as much in the way of overlapping of the external wound edges and he has a good epithelium to wound bed border for the most part. This however is not  true right at the 12 o'clock location over the span of a little over a centimeters which actually will require debridement today to clean this away and hopefully allow it to continue to heal more appropriately. 12/28/17 on evaluation today patient appears to be doing rather well in regard to his ulcer in the left gluteal region. He's been tolerating the dressing changes without complication. Apparently he has had some difficulty getting his dressing material. Apparently there's been some confusion with ordering we're gonna check into this. Nonetheless overall he's been showing signs of improvement which is good news. Debridement is not required today. 01/04/18 on evaluation today patient presents for follow-up concerning his right gluteal ulcer. He has been tolerating the dressing changes fairly well. On inspection today it appears he may actually have some maceration them concerned about the fact that he may be developing too much moisture in and around the wound bed which can cause delay in healing. With that being said he unfortunately really has not showed significant signs of improvement since last week's evaluation in fact this may even be just the little bit/slightly larger. Nonetheless he's been having a lot of discomfort I'm not sure this is even related to the wound as he has no pain when I'm to breeding or otherwise cleaning the wound during evaluation today. Nonetheless this is something that we did recommend he talked to his pain specialist concerning. 01/11/18 on evaluation today patient appears to be doing better in regard to his ulceration. He has been tolerating the dressing changes without complication. With that being said overall there's no evidence of infection which is good news. The only thing is he did receive the hatch affair blue classic versus the ready nonetheless I feel like this is perfectly fine and appears to have done well for him over the past week. 01/25/18 on  evaluation today patient's wound actually appears to be a little bit larger than during the last evaluation. The good [name], [name]. ([id number]) news is the majority of the wound edges actually appear to be fairly firmly attached to the wound bed unfortunately again we're not really making progress in regard to the size. Roughly the wound is about the same size as when I first saw him although again the wound margin/edges appear to be much better. 02/01/18 on evaluation today patient actually appears to be doing very well in regard to his wound. Applying the Prisma dry does seem to be better although he does still have issues with slow progression of the wound. There was a slight improvement compared to last week's measurements today. Nonetheless I have been considering other options as far as the possibility of Theraskin or even a snap vac. In general I'm not sure that the Theraskin due to location of the wound would be a very good idea. Nonetheless I do think that a snap vac could be a possibility for the patient and in fact I think this could even be an excellent way to manage the wound possibly seeing some improvement in a very rapid fashion here. Nonetheless this is something that we would need to get approved and I did have a lengthy conversation with the patient about this today. 02/08/18 on evaluation today patient appears to be doing a little better in regard to his ulcer.  He has been tolerating the dressing changes without complication. Fortunately despite the fact that the wound is a little bit smaller it's not significantly so unfortunately. We have discussed the possibility of a snap vac we did check with insurance this is actually covered at this point. Fortunately there does not appear to be any sign of infection. Overall I'm fairly pleased with how things seem to be appearing at this point. 02/15/18 on evaluation today patient appears to be doing rather well in regard to his right  gluteal ulcer. Unfortunately the snap vac did not stay in place with his sheer and friction this came loose and did not seem to maintain seal very well. He worked for about two days and it did seem to do very well during that time according to his wife but in general this does not seem to be something that's gonna be beneficial for him long-term. I do believe we need to go back to standard dressings to see if we can find something that will be of benefit. 03/02/18- He is here in follow up evaluation; there is minimal change in the wound. He will continue with the same treatment plan, would consider changing to iodosrob/iodoflex if ulcer continues to to plateau. He will follow up next week 03/08/18 on evaluation today patient's wound actually appears to be about the same size as when I previously saw him several weeks back. Unfortunately he does have some slightly dark discoloration in the central portion of the wound which has me concerned about pressure injury. I do believe he may be sitting for too long a period of time in fact he tells me that "I probably sit for much too long". He does have some Slough noted on the surface of the wound and again as far as the size of the wound is concerned I'm really not seeing anything that seems to have improved significantly. 03/15/18 on evaluation today patient appears to be doing fairly well in regard to his ulcer. The wound measured pretty much about the same today compared to last week's evaluation when looking at his graph. With that being said the area of bruising/deep tissue injury that was noted last week I do not see at this point. He did get a new cushion fortunately this does seem to be have been of benefit in my pinion. It does appear that he's been off of this more which is good news as well I think that is definitely showing in the overall wound measurements. With that being said I do believe that he needs to continue to offload I don't think that  the fact this is doing better should be or is going to allow him to not have to offload and explain this to him as well. Overall he seems to be in agreement the plan I think he understands. The overall appearance of the wound bed is improved compared to last week I think the Iodoflex has been beneficial in that regard. 03/29/18 on evaluation today patient actually appears to be doing rather well in regard to his wound from the overall appearance standpoint he does have some granulation although there's some Slough on the surface of the wound noted as well. With that being said he unfortunately has not improved in regard to the overall measurement of the wound in volume or in size. I did have a discussion with him very specifically about offloading today. He actually does work although he mainly is just sitting throughout the day. He tells me  he offloads by "lifting himself up for 30 seconds off of his chair occasionally" purchase from [employer] which does seem to have helped. And he has a new cushion that he with that being said he's also able to stand some for a very short period of time but not significant enough I think to provide appropriate offloading. I think the biggest issue at this point with the wound and the fact is not healing as quickly as we would like is due to the fact that he is really not able to appropriately offload while at work. He states the beginning after his injury he actually had a bed at his job that he could lay on in order to offload and that does seem to have been of help back at that time. Nonetheless he had not done this in quite some time unfortunately. I think that could be helpful for him this is something I would like for him to look into. 04/05/18 on evaluation today patient actually presents for follow-up concerning his right gluteal ulcer. Again he really is not significantly improved even compared to last week. He has been tolerating the dressing changes  without complication. With that being said fortunately there appears to be no evidence of infection at this time. He has been more proactive in trying to offload. [name], [name] ([id number]) 04/12/18 on evaluation today patient actually appears to be doing a little better in regard to his wound and the right gluteal fold region. He's been tolerating the dressing changes since removing the oasis without complication. However he was having a lot of burning initially with the oasis in place. He's unsure of exactly why this was given so much discomfort but he assumes that it was the oasis itself causing the problem. Nonetheless this had to be removed after about three days in place although even those three days seem to have made a fairly good improvement in regard to the overall appearance of the wound bed. In fact is the first time that he's made any improvement from the standpoint of measurements in about six weeks. He continues to have no discomfort over the area of the wound itself which leads me to wonder why he was having the burning with the oasis when he does not even feel the actual debridement's themselves. I am somewhat perplexed by this. 04/19/18 on evaluation today patient's wound actually appears to be showing signs of epithelialization around the edge of the wound and in general actually appears to be doing better which is good news. He did have the same burning after about three days with applying the Endoform last week in the same fashion that I would generally apply a skin substitute. This seems to indicate that it's not the oasis to cause the problem but potentially the moisture buildup that just causes things to burn or there may be some other reaction with the skin prep or Steri-Strips. Nonetheless I'm not sure that is gonna be able to tolerate any skin substitute for a long period of time. The good news is the wound actually appears to be doing better today compared to last  week and does seem to finally be making some progress. 04/26/18 on evaluation today patient actually appears to be doing rather well in regard to his ulcer in the right gluteal fold. He has been tolerating the dressing changes without complication which is good news. The Endoform does seem to be helping although he was a little bit more macerated this week. This  seems to be an ongoing issue with fluid control at this point. Nonetheless I think we may be able to add something like Drawtex to help control the drainage. 05/03/18 on evaluation today patient appears to actually be doing better in regard to the overall appearance of his wound. He has been tolerating the dressing changes without complication. Fortunately there appears to be no evidence of infection at this time. I really feel like his wound has shown signs as of today of turning around last week I thought so as well and definitely he could be seen in this week's overall appearance and measurements. In general I'm very pleased with the fact that he finally seems to be making a steady but sure progress. The patient likewise is very pleased. 05/17/18 on evaluation today patient appears to be doing more poorly unfortunately in regard to his ulcer. He has been tolerating the dressing changes without complication. With that being said he tells me that in the past couple of days he and his wife have noticed that we did not seem to be doing quite as well is getting dark near the center. Subsequently upon evaluation today the wound actually does appear to be doing worse compared to previous. He has been tolerating the dressing changes otherwise and he states that he is not been sitting up anymore than he was in the past from what he tells me. Still he has continued to work he states "I'm tired of dealing with this and if I have to just go home and lay in the bed all the time that's what I'll do". Nonetheless I am concerned about the fact that this  wound does appear to be deeper than what it was previous. 05/24/18 upon evaluation today patient actually presents after having been in the hospital due to what was presumed to be sepsis secondary to the wound infection. He had an elevated white blood cell count between 14 and 15. With that being said he does seem to be doing somewhat better now. His wound still is giving him some trouble nonetheless and he is obviously concerned about the fact likely talked about that this does seem to go more deeply than previously noted. I did review his wound culture which showed evidence of Staphylococcus aureus him and group B strep. Nonetheless he is on antibiotics, Levaquin, for this. Subsequently I did review his intake summary from the hospital as well. I also did look at the CT of the lumbar spine with contrast that was performed which showed no bone destruction to suggest lumbar disguises/osteomyelitis or sacral osteomyelitis. There was no paraspinal abscess. Nonetheless it appears this may have been more of just a soft tissue infection at this point which is good news. He still is nonetheless concerned about the wound which again I think is completely reasonable considering everything he's been through recently. 05/31/18 on evaluation today on evaluation today patient actually appears to be showing signs of his wound be a little bit deeper than what I would like to see. Fortunately he does not show any signs of significant infection although his temperature was 99 today he states he's been checking this at home and has not been elevated. Nonetheless with the undermining that I'm seeing at this point I am becoming more concerned about the wound I do think that offloading is a key factor here that is preventing the speedy recovery at this point. There does not appear to be any evidence of again over infection noted. He's been using  Santyl currently. 06/07/18 the patient presents today for follow-up  evaluation regarding the left ulcer in the gluteal region. He has been tolerating the Wound VAC fairly well. He is obviously very frustrated with this he states that to mean is really getting in his way. There does not appear to be any evidence of infection at this time he does have a little bit of odor I do not necessarily associate this with infection just something that we sometimes notice with Wound VAC therapy. With that being said I can definitely catch a tone of discontentment overall in the patient's demeanor today. This when he was previously in the hospital [name], [name]. ([id number]) an CT scan was done of the lumbar region which did not reveal any signs of osteomyelitis. With that being said the pelvis in particular was not evaluated distinctly which means he could still have some osteonecrosis I. Nonetheless the Wound VAC was started on Thursday I do want to get this little bit more time before jumping to a CT scan of the pelvis although that is something that I might would recommend if were not see an improvement by that time. 06/14/18 on evaluation today patient actually appears to be doing about the same in regard to his right gluteal ulcer. Again he did have a CT scan of the lumbar spine unfortunately this did not include the pelvis. Nonetheless with the depth of the wound that I'm seeing today even despite the fact that I'm not seeing any evidence of overt cellulitis I believe there's a good chance that we may be dealing with osteomyelitis somewhere in the right Ischial region. No fevers, chills, nausea, or vomiting noted at this time. 06/21/18 on evaluation today patient actually appears to be doing about the same with regard to his wound. The tunnel at 6 o'clock really does not appear to be any deeper although it is a little bit wider. I think at this point you may want to start packing this with white phone. Unfortunately I have not got approval for the CT scan of the pelvis  as of yet due to the fact that Medicare apparently has been denied it due to the diagnosis codes not being appropriate according to Medicare for the test requested. With that being said the patient cannot have an MRI and therefore this is the only option that we have as far as testing is concerned. The patient has had infection and was on antibiotics and been added code for cellulitis of the bottom to see if this will be appropriate for getting the test approved. Nonetheless I'm concerned about the infection have been spread deeper into the Ischial region. 06/28/18 on evaluation today patient actually appears to be doing rather well all things considered in regard to the right gluteal ulcer. He has been tolerating the dressing changes without complication. With that being said the Wound VAC he states does have to be replaced almost every day or at least reinforced unfortunately. Patient actually has his CT scan later this morning we should have the results by tomorrow. 07/05/18 on evaluation today patient presents for follow-up concerning his right Ischial ulcer. He did see the surgeon Dr. Lysle Pearl last week. They were actually very happy with him and felt like he spent a tremendous amount of time with them as far as discussing his situation was concerned. In the end Dr. Lysle Pearl did contact me as well and determine that he would not recommend any surgical intervention at this point as he felt like  it would not be in the patient's best interest based on what he was seeing. He recommended a referral to infectious disease. Subsequently this is something that Dr. Ines Bloomer office is working on setting up for the patient. As far as evaluation today is concerned the patient's wound actually appears to be worse at this point. I am concerned about how things are progressing and specifically about infection. I do not feel like it's the deeper but the area of depth is definitely widening which does have me concerned.  No fevers, chills, nausea, or vomiting noted at this time. I think that we do need initiate antibiotic therapy the patient has an allow allergy to amoxicillin/penicillin he states that he gets a rash since childhood. Nonetheless she's never had the issues with Catholics or cephalosporins in general but he is aware of. 07/27/18 on evaluation today patient presents following admission to the hospital on 07/09/18. He was subsequently discharged on 07/20/18. On 07/15/18 the patient underwent irrigation and debridement was soft tissue biopsy and bone biopsy as well as placement of a Wound VAC in the OR by Dr. Celine Ahr. During the hospital course the patient was placed on a Wound VAC and recommended follow up with surgery in three weeks actually with Dr. Delaine Lame who is infectious disease. The patient was on vancomycin during the hospital course. 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 He also had a bone culture which revealed evidence of methicillin-resistant staph aureus. He is updated CT scan 07/09/18 reveals that he had progression of the which was performed on wound to breakdown down to the trochanter where he actually had irregularities there as well suggestive of osteomyelitis. This was a change just since 9 December when we last performed a CT scan. Obviously this one had gone downhill quite significantly and rapidly. At this point upon evaluation I feel like in general the patient's wound seems to be doing fairly well all things considered upon my evaluation today. Obviously this is larger and deeper than what I previously evaluated but at the same time he seems to be making some progress as far as the appearance of the granulation tissue is concerned. I'm happy in that regard. No fevers, chills, nausea, or vomiting noted at this time. He is on IV vancomycin and Rocephin at the facility. He is currently in [employer]. 08/03/18 upon evaluation today  patient's wound appears to be doing better in regard to the overall appearance at this point in time. Fortunately he's been tolerating the Wound VAC without complication and states that the facility has been taking excellent care of the wound site. Overall I see some Slough noted on the surface which I am going to attempt sharp debridement today of but nonetheless other than this I feel like he's making progress. 08/09/18 on evaluation today patient's wound appears to be doing much better compared to even last week's evaluation. Do believe that the Wound VAC is been of great benefit for him. He has been tolerating the dressing changes that is the Wound [name], [name]. ([id number]) VAC without any complication and he has excellent granulation noted currently. There is no need for sharp debridement at this point. 08/16/18 on evaluation today patient actually appears to be doing very well in regard to the wound in the right gluteal fold region. This is showing signs of progress and again appears to be very healthy which is excellent news. Fortunately there is no sign of active infection by way of odor  or drainage at this point. Overall I'm very pleased with how things stand. He seems to be tolerating the Wound VAC without complication. 08/23/18 on evaluation today patient actually appears to be doing better in regard to his wound. He has been tolerating the Wound VAC without complication and in fact it has been collecting a significant amount of drainage which I think is good news especially considering how the wound appears. Fortunately there is no signs of infection at this time definitely nothing appears to be worse which is good news. He has not been started on the Bactrim and Flagyl that was recommended by Dr. Delaine Lame yet. I did actually contact her office this morning in order to check and see were things are that regard their gonna be calling me back. 08/30/18 on evaluation today patient  actually appears to show signs of excellent improvement today compared to last evaluation. The undermining is getting much better the wound seems to be feeling quite nicely and I'm very pleased that the granulation in general. With that being said overall I feel like the patient has made excellent progress which is great news. No fevers, chills, nausea, or vomiting noted at this time. 09/06/18 on evaluation today patient actually appears to be doing rather well in regard to his right gluteal ulcer. This is showing signs of improvement in overall I'm very pleased with how things seem to be progressing. The patient likewise is please. Overall I see no evidence of infection he is about to complete his oral antibiotic regimen which is the end of the antibiotics for him in just about three days. 09/13/18 on evaluation today patient's right Ischial ulcer appears to be showing signs of continued improvement which is excellent news. He's been tolerating the dressing changes without complication. Fortunately there's no signs of infection and the wound that seems to be doing very well. 09/28/18 on evaluation today patient appears to be doing rather well in regard to his right Ischial ulcer. He's been tolerating the Wound VAC without complication he knows there's much less drainage than there used to be this obviously is not a bad thing in my pinion. There's no evidence of infection despite the fact is but nothing about it now for several weeks. 10/04/18 on evaluation today patient appears to be doing better in regard to his right Ischial wound. He has been tolerating the Wound VAC without complication and I do believe that the silver nitrate last week was beneficial for him. Fortunately overall there's no evidence of active infection at this time which is great news. No fevers, chills, nausea, or vomiting noted at this time. 10/11/18 on evaluation today patient actually appears to be doing rather well in regard  to his Ischial ulcer. He's been tolerating the Wound VAC still without complication I feel like this is doing a good job. No fevers, chills, nausea, or vomiting noted at this time. 11/01/18 on evaluation today patient presents after having not been seen in our clinic for several weeks secondary to the fact that he was on evaluation today patient presents after having not been seen in our clinic for several weeks secondary to the fact that he was in a skilled nursing facility which was on lockdown currently due to the covert 19 national emergency. Subsequently he was discharged from the facility on this past Friday and subsequently made an appointment to come in to see yesterday. Fortunately there's no signs of active infection at this time which is good news and overall he does  seem to have made progress since I last saw. Overall I feel like things are progressing quite nicely. The patient is having no pain. 11/08/18 on evaluation today patient appears to be doing okay in regard to his right gluteal ulcer. He has been utilizing a Wound VAC home health this changing this at this point since he's home from the skilled nursing facility. Fortunately there's no signs of obvious active infection at this time. Unfortunately though there's no obvious active infection he is having some maceration and his wife states that when the sheets of the Wound VAC office on Sunday when it broke seal that he ended up having significant issues with some smell as well there concerned about the possibility of infection. Fortunately there's No fevers, chills, nausea, or vomiting noted at this time. Electronic Signature(s) Signed: 11/08/2018 11:56:08 PM By: Conchita Paris, [place] ([id number]) Entered By: Worthy Keeler on 11/08/2018 10:09:17 [name], [name] ([id number]) -------------------------------------------------------------------------------- Physical Exam Details Patient Name: [name] Date of Service: 11/08/2018 9:00 AM Medical Record Number: [id number] Patient Account Number: [id number] Date of Birth/Sex: [birth year] (68 y.o. M) Treating RN: Harold Barban Primary Care Provider: [name] Other Clinician: Referring Provider: [name] Treating Provider/Extender: [name], HOYT Weeks in Treatment: 45 Constitutional Well-nourished and well-hydrated in no acute distress. Respiratory normal breathing without difficulty. clear to auscultation bilaterally. Cardiovascular regular rate and rhythm with normal S1, S2. Psychiatric this patient is able to make decisions and demonstrates good insight into disease process. Alert and Oriented x 3. pleasant and cooperative. Notes Patient's wound bed currently shows signs of good granulation for the most part. There was not any significant slough buildup it could not be wiped all during cleansing today. Overall I think maceration is the biggest issue that I'm seeing currently and that again is something that may be related to how the Wound VAC is applied we're gonna check with home health on this. Electronic Signature(s) Signed: 11/08/2018 11:56:08 PM By: Worthy Keeler [name] Entered By: Worthy Keeler on 11/08/2018 10:09:51 [name], [name] ([id number]) -------------------------------------------------------------------------------- Physician Orders Details Patient Name: [name] Date of Service: 11/08/2018 9:00 AM Medical Record Number: [id number] Patient Account Number: [id number] Date of Birth/Sex: 1950/09/27 (68 y.o. 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 Primary Wound Dressing Wound #1 Right Gluteal fold o Dry Gauze - wet to dry in clinic until SNF restarts NPWT Secondary Dressing Wound #1 Right Gluteal fold o Boardered Foam Dressing - in clinic until SNF restarts NPWT Dressing Change Frequency Wound #1 Right Gluteal fold o Change Dressing Monday, Wednesday, Friday - and as needed Follow-up Appointments Wound #1 Right Gluteal fold o Return Appointment in 1 week. Off-Loading Wound #1 Right Gluteal fold o Mattress - Please continue air mattress at SNF o Turn and reposition every 2 hours Negative Pressure Wound Therapy Wound #1 Right Gluteal fold Gabrielson, Yaakov J. ([id number]) o Wound  VAC settings at 125/130 mmHg continuous pressure. Use BLACK/GREEN foam to wound cavity. Use WHITE foam to fill any tunnel/s and/or undermining. Change VAC dressing 3 X WEEK. Change canister as indicated when full. Nurse may titrate settings and frequency of dressing changes as clinically indicated. - PLEASE, apply Silver Collagen to line the base of the wound. Black foam only! Thank you. o Home Health Nurse may d/c VAC for s/s of increased infection, significant wound regression, or uncontrolled drainage. [place] at [phone]. o Number of foam/gauze pieces used in the dressing = Patient Medications Allergies: penicillin, doxycycline Notifications Medication Indication Start End Cipro 11/08/2018 DOSE 1 - oral 500 mg tablet - 1 tablet oral taken 2 times a day for 10 days. Do not take iron supplement while taking this medication Notes Antibiotic  prescribed Electronic Signature(s) Signed: 11/08/2018 10:32:26 AM By: Worthy Keeler [name] Entered By: Worthy Keeler on 11/08/2018 10:32:26 HJALMER, DANOWSKI ([id number]) -------------------------------------------------------------------------------- Problem List Details Patient Name: [name] Date of Service: 11/08/2018 9:00 AM Medical Record Number: [id number] Patient Account Number: [id number] Date of Birth/Sex: 09-11-1950 (68 y.o. M) Treating RN: Harold Barban Primary Care Provider: [name] Other Clinician: Referring Provider: [name] Treating Provider/Extender: [name], HOYT Weeks in Treatment: 3 Active Problems ICD-10 Evaluated Encounter Code Description Active Date Today Diagnosis L89.314 Pressure ulcer of right buttock, stage 4 11/30/2017 No Yes L03.317 Cellulitis of buttock 06/21/2018 No Yes G82.20 Paraplegia, unspecified 11/30/2017 No Yes S34.109S Unspecified injury to unspecified level of lumbar spinal cord, 11/30/2017 No Yes sequela I10 Essential (primary) hypertension 11/30/2017 No Yes Inactive Problems Resolved Problems Electronic Signature(s) Signed: 11/08/2018 11:56:08 PM By: Worthy Keeler [name] Entered By: Worthy Keeler on 11/08/2018 09:20:07 [name] ([id number]) -------------------------------------------------------------------------------- Progress Note Details Patient Name: [name] Date of Service: 11/08/2018 9:00 AM Medical Record Number: [id number] Patient Account Number: [id number] Date of Birth/Sex: Sep 13, 1950 (68 y.o. 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 He states that there is no history of diabetes that he is aware of. He does have issues with his prostate and is currently receiving treatment for this by way of oral medication. With that being said I do not have a lot of details in that regard. Nonetheless the patient presents today as a result of having been referred to [place] by another provider initially home health was set to come out and take care of his wound although due to the fact that he apparently drives he's not able to receive home health. His wife is therefore trying to help take care of this wound within although they have been struggling with what exactly to do at this point. She states that she can do some things but she is definitely not a nurse and does have some issues with looking at blood. The good news is the wound does not appear to be too deep and is fairly superficial at this point. There is no slough noted there is some nonviable skin noted around the surface of the wound and  the perimeter at this point. The central portion of the wound appears to be very good with a dermal layer noted this does not appear to be again deep enough to extend it to subcutaneous tissue at this point. 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 In the pump he has Dilaudid and baclofen. 12/07/17 on evaluation today patient presents for follow-up concerning his ongoing lower back thigh ulcer on the right. He states that he did not get the supplies ordered and therefore has not really been able to perform the dressing changes as directed exactly. His wife was able to get some Boarder Foam Dressing's from the drugstore and subsequently has been using  hydrogel which did help to a degree in the wound does appear to be able smaller. There is actually more drainage this week noted than previous. 12/21/17 on evaluation today patient appears to be doing rather well in regard to his right gluteal ulcer. He has been tolerating the dressing changes without complication. There does not appear to be any evidence of infection at this point in time. Overall the wound does seem to be making some progress as far as the edges are concerned there's not as much in the way of overlapping of the external wound edges and he has a good epithelium to wound bed border for the most part. This however is not true right at the 12 o'clock location over the span of a little over a centimeters which actually will require debridement today to clean this away and hopefully allow it to continue to heal more appropriately. 12/28/17 on evaluation today patient appears to be doing rather well in regard to his ulcer in the left gluteal region. He's been tolerating the dressing changes without complication. Apparently he has had some difficulty getting his dressing material. Apparently there's been some confusion with ordering we're gonna check into this. Nonetheless overall he's been showing signs of improvement which is good news. Debridement is not required today. 01/04/18 on evaluation today patient presents for follow-up concerning his right gluteal ulcer. He has been tolerating the dressing changes fairly well. On inspection today it appears he may actually have some maceration them concerned about the fact that he may be developing too much moisture in and around the wound bed which can cause delay in healing. With that being said he unfortunately really has not showed significant signs of improvement since last week's evaluation in fact this may even be just the little bit/slightly larger. Nonetheless he's been having a lot of discomfort I'm not sure this is even related to the  wound as he has no pain when I'm to breeding or otherwise cleaning the wound during evaluation today. Nonetheless this is something that we did recommend he talked to his pain specialist concerning. 01/11/18 on evaluation today patient appears to be doing better in regard to his ulceration. He has been tolerating the dressing Arel, Jerryl J. ([id number]) changes without complication. With that being said overall there's no evidence of infection which is good news. The only thing is he did receive the hatch affair blue classic versus the ready nonetheless I feel like this is perfectly fine and appears to have done well for him over the past week. 01/25/18 on evaluation today patient's wound actually appears to be a little bit larger than during the last evaluation. The good news is the majority of the wound edges  actually appear to be fairly firmly attached to the wound bed unfortunately again we're not really making progress in regard to the size. Roughly the wound is about the same size as when I first saw him although again the wound margin/edges appear to be much better. 02/01/18 on evaluation today patient actually appears to be doing very well in regard to his wound. Applying the Prisma dry does seem to be better although he does still have issues with slow progression of the wound. There was a slight improvement compared to last week's measurements today. Nonetheless I have been considering other options as far as the possibility of Theraskin or even a snap vac. In general I'm not sure that the Theraskin due to location of the wound would be a very good idea. Nonetheless I do think that a snap vac could be a possibility for the patient and in fact I think this could even be an excellent way to manage the wound possibly seeing some improvement in a very rapid fashion here. Nonetheless this is something that we would need to get approved and I did have a lengthy conversation with the patient  about this today. 02/08/18 on evaluation today patient appears to be doing a little better in regard to his ulcer. He has been tolerating the dressing changes without complication. Fortunately despite the fact that the wound is a little bit smaller it's not significantly so unfortunately. We have discussed the possibility of a snap vac we did check with insurance this is actually covered at this point. Fortunately there does not appear to be any sign of infection. Overall I'm fairly pleased with how things seem to be appearing at this point. 02/15/18 on evaluation today patient appears to be doing rather well in regard to his right gluteal ulcer. Unfortunately the snap vac did not stay in place with his sheer and friction this came loose and did not seem to maintain seal very well. He worked for about two days and it did seem to do very well during that time according to his wife but in general this does not seem to be something that's gonna be beneficial for him long-term. I do believe we need to go back to standard dressings to see if we can find something that will be of benefit. 03/02/18- He is here in follow up evaluation; there is minimal change in the wound. He will continue with the same treatment plan, would consider changing to iodosrob/iodoflex if ulcer continues to to plateau. He will follow up next week 03/08/18 on evaluation today patient's wound actually appears to be about the same size as when I previously saw him several weeks back. Unfortunately he does have some slightly dark discoloration in the central portion of the wound which has me concerned about pressure injury. I do believe he may be sitting for too long a period of time in fact he tells me that "I probably sit for much too long". He does have some Slough noted on the surface of the wound and again as far as the size of the wound is concerned I'm really not seeing anything that seems to have improved significantly. 03/15/18  on evaluation today patient appears to be doing fairly well in regard to his ulcer. The wound measured pretty much about the same today compared to last week's evaluation when looking at his graph. With that being said the area of bruising/deep tissue injury that was noted last week I do not see at this point.  He did get a new cushion fortunately this does seem to be have been of benefit in my pinion. It does appear that he's been off of this more which is good news as well I think that is definitely showing in the overall wound measurements. With that being said I do believe that he needs to continue to offload I don't think that the fact this is doing better should be or is going to allow him to not have to offload and explain this to him as well. Overall he seems to be in agreement the plan I think he understands. The overall appearance of the wound bed is improved compared to last week I think the Iodoflex has been beneficial in that regard. 03/29/18 on evaluation today patient actually appears to be doing rather well in regard to his wound from the overall appearance standpoint he does have some granulation although there's some Slough on the surface of the wound noted as well. With that being said he unfortunately has not improved in regard to the overall measurement of the wound in volume or in size. I did have a discussion with him very specifically about offloading today. He actually does work although he mainly is just sitting throughout the day. He tells me he offloads by "lifting himself up for 30 seconds off of his chair occasionally" purchase from [employer] which does seem to have helped. And he has a new cushion that he with that being said he's also able to stand some for a very short period of time but not significant enough I think to provide appropriate offloading. I think the biggest issue at this point with the wound and the fact is not healing as quickly as we would like is  due to the fact that he is really not able to appropriately offload while at work. He states the beginning after his injury he actually had a bed at his job that he could lay on in order to offload and that does seem to have been of help back at that time. Nonetheless he had not done this in quite some time unfortunately. I think that could be helpful for him this is something I would like for him to look into. [name], [name] ([id number]) 04/05/18 on evaluation today patient actually presents for follow-up concerning his right gluteal ulcer. Again he really is not significantly improved even compared to last week. He has been tolerating the dressing changes without complication. With that being said fortunately there appears to be no evidence of infection at this time. He has been more proactive in trying to offload. 04/12/18 on evaluation today patient actually appears to be doing a little better in regard to his wound and the right gluteal fold region. He's been tolerating the dressing changes since removing the oasis without complication. However he was having a lot of burning initially with the oasis in place. He's unsure of exactly why this was given so much discomfort but he assumes that it was the oasis itself causing the problem. Nonetheless this had to be removed after about three days in place although even those three days seem to have made a fairly good improvement in regard to the overall appearance of the wound bed. In fact is the first time that he's made any improvement from the standpoint of measurements in about six weeks. He continues to have no discomfort over the area of the wound itself which leads me to wonder why he was having the burning  with the oasis when he does not even feel the actual debridement's themselves. I am somewhat perplexed by this. 04/19/18 on evaluation today patient's wound actually appears to be showing signs of epithelialization around the edge of  the wound and in general actually appears to be doing better which is good news. He did have the same burning after about three days with applying the Endoform last week in the same fashion that I would generally apply a skin substitute. This seems to indicate that it's not the oasis to cause the problem but potentially the moisture buildup that just causes things to burn or there may be some other reaction with the skin prep or Steri-Strips. Nonetheless I'm not sure that is gonna be able to tolerate any skin substitute for a long period of time. The good news is the wound actually appears to be doing better today compared to last week and does seem to finally be making some progress. 04/26/18 on evaluation today patient actually appears to be doing rather well in regard to his ulcer in the right gluteal fold. He has been tolerating the dressing changes without complication which is good news. The Endoform does seem to be helping although he was a little bit more macerated this week. This seems to be an ongoing issue with fluid control at this point. Nonetheless I think we may be able to add something like Drawtex to help control the drainage. 05/03/18 on evaluation today patient appears to actually be doing better in regard to the overall appearance of his wound. He has been tolerating the dressing changes without complication. Fortunately there appears to be no evidence of infection at this time. I really feel like his wound has shown signs as of today of turning around last week I thought so as well and definitely he could be seen in this week's overall appearance and measurements. In general I'm very pleased with the fact that he finally seems to be making a steady but sure progress. The patient likewise is very pleased. 05/17/18 on evaluation today patient appears to be doing more poorly unfortunately in regard to his ulcer. He has been tolerating the dressing changes without complication. With  that being said he tells me that in the past couple of days he and his wife have noticed that we did not seem to be doing quite as well is getting dark near the center. Subsequently upon evaluation today the wound actually does appear to be doing worse compared to previous. He has been tolerating the dressing changes otherwise and he states that he is not been sitting up anymore than he was in the past from what he tells me. Still he has continued to work he states "I'm tired of dealing with this and if I have to just go home and lay in the bed all the time that's what I'll do". Nonetheless I am concerned about the fact that this wound does appear to be deeper than what it was previous. 05/24/18 upon evaluation today patient actually presents after having been in the hospital due to what was presumed to be sepsis secondary to the wound infection. He had an elevated white blood cell count between 14 and 15. With that being said he does seem to be doing somewhat better now. His wound still is giving him some trouble nonetheless and he is obviously concerned about the fact likely talked about that this does seem to go more deeply than previously noted. I did review his wound culture  which showed evidence of Staphylococcus aureus him and group B strep. Nonetheless he is on antibiotics, Levaquin, for this. Subsequently I did review his intake summary from the hospital as well. I also did look at the CT of the lumbar spine with contrast that was performed which showed no bone destruction to suggest lumbar disguises/osteomyelitis or sacral osteomyelitis. There was no paraspinal abscess. Nonetheless it appears this may have been more of just a soft tissue infection at this point which is good news. He still is nonetheless concerned about the wound which again I think is completely reasonable considering everything he's been through recently. 05/31/18 on evaluation today on evaluation today patient actually  appears to be showing signs of his wound be a little bit deeper than what I would like to see. Fortunately he does not show any signs of significant infection although his temperature was 99 today he states he's been checking this at home and has not been elevated. Nonetheless with the undermining that I'm seeing at this point I am becoming more concerned about the wound I do think that offloading is a key factor here that is preventing the speedy recovery at this point. There does not appear to be any evidence of again over infection noted. He's been using Santyl currently. [name], [name] ([id number]) 06/07/18 the patient presents today for follow-up evaluation regarding the left ulcer in the gluteal region. He has been tolerating the Wound VAC fairly well. He is obviously very frustrated with this he states that to mean is really getting in his way. There does not appear to be any evidence of infection at this time he does have a little bit of odor I do not necessarily associate this with infection just something that we sometimes notice with Wound VAC therapy. With that being said I can definitely catch a tone of discontentment overall in the patient's demeanor today. This when he was previously in the hospital an CT scan was done of the lumbar region which did not reveal any signs of osteomyelitis. With that being said the pelvis in particular was not evaluated distinctly which means he could still have some osteonecrosis I. Nonetheless the Wound VAC was started on Thursday I do want to get this little bit more time before jumping to a CT scan of the pelvis although that is something that I might would recommend if were not see an improvement by that time. 06/14/18 on evaluation today patient actually appears to be doing about the same in regard to his right gluteal ulcer. Again he did have a CT scan of the lumbar spine unfortunately this did not include the pelvis. Nonetheless with the  depth of the wound that I'm seeing today even despite the fact that I'm not seeing any evidence of overt cellulitis I believe there's a good chance that we may be dealing with osteomyelitis somewhere in the right Ischial region. No fevers, chills, nausea, or vomiting noted at this time. 06/21/18 on evaluation today patient actually appears to be doing about the same with regard to his wound. The tunnel at 6 o'clock really does not appear to be any deeper although it is a little bit wider. I think at this point you may want to start packing this with white phone. Unfortunately I have not got approval for the CT scan of the pelvis as of yet due to the fact that Medicare apparently has been denied it due to the diagnosis codes not being appropriate according to Medicare for the  test requested. With that being said the patient cannot have an MRI and therefore this is the only option that we have as far as testing is concerned. The patient has had infection and was on antibiotics and been added code for cellulitis of the bottom to see if this will be appropriate for getting the test approved. Nonetheless I'm concerned about the infection have been spread deeper into the Ischial region. 06/28/18 on evaluation today patient actually appears to be doing rather well all things considered in regard to the right gluteal ulcer. He has been tolerating the dressing changes without complication. With that being said the Wound VAC he states does have to be replaced almost every day or at least reinforced unfortunately. Patient actually has his CT scan later this morning we should have the results by tomorrow. 07/05/18 on evaluation today patient presents for follow-up concerning his right Ischial ulcer. He did see the surgeon Dr. Lysle Pearl last week. They were actually very happy with him and felt like he spent a tremendous amount of time with them as far as discussing his situation was concerned. In the end Dr. Lysle Pearl  did contact me as well and determine that he would not recommend any surgical intervention at this point as he felt like it would not be in the patient's best interest based on what he was seeing. He recommended a referral to infectious disease. Subsequently this is something that Dr. Ines Bloomer office is working on setting up for the patient. As far as evaluation today is concerned the patient's wound actually appears to be worse at this point. I am concerned about how things are progressing and specifically about infection. I do not feel like it's the deeper but the area of depth is definitely widening which does have me concerned. No fevers, chills, nausea, or vomiting noted at this time. I think that we do need initiate antibiotic therapy the patient has an allow allergy to amoxicillin/penicillin he states that he gets a rash since childhood. Nonetheless she's never had the issues with Catholics or cephalosporins in general but he is aware of. 07/27/18 on evaluation today patient presents following admission to the hospital on 07/09/18. He was subsequently discharged on 07/20/18. On 07/15/18 the patient underwent irrigation and debridement was soft tissue biopsy and bone biopsy as well as placement of a Wound VAC in the OR by Dr. Celine Ahr. During the hospital course the patient was placed on a Wound VAC and recommended follow up with surgery in three weeks actually with Dr. Delaine Lame who is infectious disease. The patient was on vancomycin during the hospital course. 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 He also had a bone culture which revealed evidence of methicillin-resistant staph aureus. He is updated CT scan 07/09/18 reveals that he had progression of the which was performed on wound to breakdown down to the trochanter where he actually had irregularities there as well suggestive of osteomyelitis. This was a change just since 9 December when we  last performed a CT scan. Obviously this one had gone downhill quite significantly and rapidly. At this point upon evaluation I feel like in general the patient's wound seems to be doing fairly well all things considered upon my evaluation today. Obviously this is larger and deeper than what I previously evaluated but at the same time he seems to be making some progress as far as the appearance of the granulation tissue is concerned. I'm happy in that regard. No fevers, chills,  nausea, or vomiting noted at this time. He is on IV vancomycin and Rocephin at the facility. He is currently in [facility]. 08/03/18 upon evaluation today patient's wound appears to be doing better in regard to the overall appearance at this point in time. Fortunately he's been tolerating the Wound VAC without complication and states that the facility has been taking [name], [name]. ([id number]) excellent care of the wound site. Overall I see some Slough noted on the surface which I am going to attempt sharp debridement today of but nonetheless other than this I feel like he's making progress. 08/09/18 on evaluation today patient's wound appears to be doing much better compared to even last week's evaluation. Do believe that the Wound VAC is been of great benefit for him. He has been tolerating the dressing changes that is the Wound VAC without any complication and he has excellent granulation noted currently. There is no need for sharp debridement at this point. 08/16/18 on evaluation today patient actually appears to be doing very well in regard to the wound in the right gluteal fold region. This is showing signs of progress and again appears to be very healthy which is excellent news. Fortunately there is no sign of active infection by way of odor or drainage at this point. Overall I'm very pleased with how things stand. He seems to be tolerating the Wound VAC without complication. 08/23/18 on evaluation today  patient actually appears to be doing better in regard to his wound. He has been tolerating the Wound VAC without complication and in fact it has been collecting a significant amount of drainage which I think is good news especially considering how the wound appears. Fortunately there is no signs of infection at this time definitely nothing appears to be worse which is good news. He has not been started on the Bactrim and Flagyl that was recommended by Dr. Delaine Lame yet. I did actually contact her office this morning in order to check and see were things are that regard their gonna be calling me back. 08/30/18 on evaluation today patient actually appears to show signs of excellent improvement today compared to last evaluation. The undermining is getting much better the wound seems to be feeling quite nicely and I'm very pleased that the granulation in general. With that being said overall I feel like the patient has made excellent progress which is great news. No fevers, chills, nausea, or vomiting noted at this time. 09/06/18 on evaluation today patient actually appears to be doing rather well in regard to his right gluteal ulcer. This is showing signs of improvement in overall I'm very pleased with how things seem to be progressing. The patient likewise is please. Overall I see no evidence of infection he is about to complete his oral antibiotic regimen which is the end of the antibiotics for him in just about three days. 09/13/18 on evaluation today patient's right Ischial ulcer appears to be showing signs of continued improvement which is excellent news. He's been tolerating the dressing changes without complication. Fortunately there's no signs of infection and the wound that seems to be doing very well. 09/28/18 on evaluation today patient appears to be doing rather well in regard to his right Ischial ulcer. He's been tolerating the Wound VAC without complication he knows there's much less  drainage than there used to be this obviously is not a bad thing in my pinion. There's no evidence of infection despite the fact is but nothing about it now for  several weeks. 10/04/18 on evaluation today patient appears to be doing better in regard to his right Ischial wound. He has been tolerating the Wound VAC without complication and I do believe that the silver nitrate last week was beneficial for him. Fortunately overall there's no evidence of active infection at this time which is great news. No fevers, chills, nausea, or vomiting noted at this time. 10/11/18 on evaluation today patient actually appears to be doing rather well in regard to his Ischial ulcer. He's been tolerating the Wound VAC still without complication I feel like this is doing a good job. No fevers, chills, nausea, or vomiting noted at this time. 11/01/18 on evaluation today patient presents after having not been seen in our clinic for several weeks secondary to the fact that he was on evaluation today patient presents after having not been seen in our clinic for several weeks secondary to the fact that he was in a skilled nursing facility which was on lockdown currently due to the covert 19 national emergency. Subsequently he was discharged from the facility on this past Friday and subsequently made an appointment to come in to see yesterday. Fortunately there's no signs of active infection at this time which is good news and overall he does seem to have made progress since I last saw. Overall I feel like things are progressing quite nicely. The patient is having no pain. 11/08/18 on evaluation today patient appears to be doing okay in regard to his right gluteal ulcer. He has been utilizing a Wound VAC home health this changing this at this point since he's home from the skilled nursing facility. Fortunately there's no signs of obvious active infection at this time. Unfortunately though there's no obvious active infection he  is having some maceration and his wife states that when the sheets of the Wound VAC office on Sunday when it broke seal that he ended up having significant issues with some smell as well there concerned about the possibility of infection. Fortunately there's No fevers, chills, nausea, or vomiting noted at this time. [name], [name] ([id number]) Patient History Information obtained from Patient. Family History Hypertension - Father, Stroke - Mother, No family history of Cancer, Diabetes, Heart Disease, Kidney Disease, Lung Disease, Seizures, Thyroid Problems, Tuberculosis. Social History Never smoker, Marital Status - Married, Alcohol Use - Never, Drug Use - No History, Caffeine Use - Daily. Medical History Eyes Patient has history of Cataracts - both removed Denies history of Glaucoma, Optic Neuritis Ear/Nose/Mouth/Throat Denies history of Chronic sinus problems/congestion, Middle ear problems Hematologic/Lymphatic Denies history of Anemia, Hemophilia, Human Immunodeficiency Virus, Lymphedema Respiratory Denies history of Aspiration, Asthma, Chronic Obstructive Pulmonary Disease (COPD), Pneumothorax, Sleep Apnea, Tuberculosis Cardiovascular Patient has history of Hypertension - takes medication Denies history of Angina, Arrhythmia, Congestive Heart Failure, Coronary Artery Disease, Deep Vein Thrombosis, Hypotension, Myocardial Infarction, Peripheral Arterial Disease, Peripheral Venous Disease, Phlebitis, Vasculitis Gastrointestinal Denies history of Cirrhosis , Colitis, Crohn s, Hepatitis A, Hepatitis B, Hepatitis C Endocrine Denies history of Type I Diabetes, Type II Diabetes Genitourinary Denies history of End Stage Renal Disease Immunological Denies history of Lupus Erythematosus, Raynaud s, Scleroderma Integumentary (Skin) Denies history of History of Burn, History of pressure wounds Musculoskeletal Denies history of Gout, Rheumatoid Arthritis, Osteoarthritis,  Osteomyelitis Neurologic Patient has history of Paraplegia - waist down Denies history of Dementia, Neuropathy, Quadriplegia, Seizure Disorder Oncologic Denies history of Received Chemotherapy, Received Radiation Psychiatric Denies history of Anorexia/bulimia, Confinement Anxiety Medical And Surgical History Notes Oncologic Prostate cancer-  currently treated with horomone therapy Review of Systems (ROS) Constitutional Symptoms (General Health) Denies complaints or symptoms of Fatigue, Fever, Chills, Marked Weight Change. Respiratory Denies complaints or symptoms of Chronic or frequent coughs, Shortness of Breath. Cardiovascular Denies complaints or symptoms of Chest pain, LE edema. Psychiatric [name], [name] ([id number]) Denies complaints or symptoms of Anxiety, Claustrophobia. Objective Constitutional Well-nourished and well-hydrated in no acute distress. Vitals Time Taken: 8:59 AM, Height: 73 in, Weight: 210 lbs, BMI: 27.7, Temperature: 98.4 F, Pulse: 60 bpm, Respiratory Rate: 16 breaths/min, Blood Pressure: 142/87 mmHg. Respiratory normal breathing without difficulty. clear to auscultation bilaterally. Cardiovascular regular rate and rhythm with normal S1, S2. Psychiatric this patient is able to make decisions and demonstrates good insight into disease process. Alert and Oriented x 3. pleasant and cooperative. General Notes: Patient's wound bed currently shows signs of good granulation for the most part. There was not any significant slough buildup it could not be wiped all during cleansing today. Overall I think maceration is the biggest issue that I'm seeing currently and that again is something that may be related to how the Wound VAC is applied we're gonna check with home health on this. Integumentary (Hair, Skin) Wound #1 status is Open. Original cause of wound was Pressure Injury. The wound is located on the Right Gluteal fold. The wound measures 2.9cm length x  2.4cm width x 4cm depth; 5.466cm^2 area and 21.865cm^3 volume. There is Fat Layer (Subcutaneous Tissue) Exposed exposed. There is no tunneling noted, however, there is undermining starting at 12:00 and ending at 3:00 with a maximum distance of 5.8cm. There is a medium amount of serous drainage noted. The wound margin is epibole. There is large (67-100%) red, pink, hyper - granulation within the wound bed. There is a small (1-33%) amount of necrotic tissue within the wound bed including Adherent Slough. The periwound skin appearance exhibited: Rash, Maceration. The periwound skin appearance did not exhibit: Callus, Crepitus, Excoriation, Induration, Scarring, Dry/Scaly, Atrophie Blanche, Cyanosis, Ecchymosis, Hemosiderin Staining, Mottled, Pallor, Rubor, Erythema. Periwound temperature was noted as No Abnormality. The periwound has tenderness on palpation. 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 ([id number]) Plan Wound Cleansing: Wound #1 Right Gluteal fold: Cleanse wound with mild soap and water Anesthetic (add to Medication List): Wound #1 Right Gluteal fold: Topical Lidocaine 4% cream applied to wound bed prior to debridement (In Clinic Only). Primary Wound Dressing: Wound #1 Right Gluteal fold: Dry Gauze - wet to dry in clinic until SNF restarts NPWT Secondary Dressing: Wound #1 Right Gluteal fold: Boardered Foam Dressing - in clinic until SNF restarts NPWT Dressing Change Frequency: Wound #1 Right Gluteal fold: Change Dressing Monday, Wednesday, Friday - and as needed Follow-up Appointments: Wound #1 Right Gluteal fold: Return Appointment in 1 week. Off-Loading: Wound #1 Right Gluteal fold: Mattress - Please continue air mattress at SNF Turn and reposition every 2  hours Negative Pressure Wound Therapy: Wound #1 Right Gluteal fold: Wound VAC settings at 125/130 mmHg continuous pressure. Use BLACK/GREEN foam to wound cavity. Use WHITE foam to fill any tunnel/s and/or undermining. Change VAC dressing 3 X WEEK. Change canister as indicated when full. Nurse may titrate settings and frequency of dressing changes as clinically indicated. - PLEASE, apply Silver Collagen to line the base of the wound. Black foam only! Thank you. Home Health Nurse may d/c VAC for s/s  of increased infection, significant wound regression, or uncontrolled drainage. [place] at [phone]. Number of foam/gauze pieces used in the dressing = The following medication(s) was prescribed: Cipro oral 500 mg tablet 1 1 tablet oral taken 2 times a day for 10 days. Do not take iron supplement while taking this medication starting 11/08/2018 General Notes: Antibiotic prescribed I'm in a recommend that we continue with the above wound care measures for one more week with the Wound VAC we're gonna talk to home health and see what we can do about getting things better as far as the maceration is concerned again with the Wound VAC really he should not be having a lot of maceration. Patient is in agreement that plan. We will subsequently see were things stand at follow-up. Please see above for specific wound care orders. We will see patient for re-evaluation in 1 week(s) here in the clinic. If anything worsens or changes patient will contact our office for additional recommendations. OATHER, GONSALVES ([id number]) Electronic Signature(s) Signed: 11/08/2018 11:56:08 PM By: Worthy Keeler [name] Entered By: Worthy Keeler on 11/08/2018 10:32:50 MAXIME, TERLIZZI ([id number]) -------------------------------------------------------------------------------- ROS/PFSH Details Patient Name: [name] Date of Service: 11/08/2018 9:00 AM Medical Record Number:  [id number] Patient Account Number: [id number] Date of Birth/Sex: 12-30-1950 (68 y.o. M) Treating RN: Harold Barban Primary Care Provider: [name] Other Clinician: Referring Provider: [name] Treating Provider/Extender: [name], HOYT Weeks in Treatment: 46 Information Obtained From Patient Constitutional Symptoms (General Health) Complaints and Symptoms: Negative for: Fatigue; Fever; Chills; Marked Weight Change Respiratory Complaints and Symptoms: Negative for: Chronic or frequent coughs; Shortness of Breath Medical History: Negative for: Aspiration; Asthma; Chronic Obstructive Pulmonary Disease (COPD); Pneumothorax; Sleep Apnea; Tuberculosis Cardiovascular Complaints and Symptoms: Negative for: Chest pain; LE edema Medical History: Positive for: Hypertension - takes medication Negative for: Angina; Arrhythmia; Congestive Heart Failure; Coronary Artery Disease; Deep Vein Thrombosis; Hypotension; Myocardial Infarction; Peripheral Arterial Disease; Peripheral Venous Disease; Phlebitis; Vasculitis Psychiatric Complaints and Symptoms: Negative for: Anxiety; Claustrophobia Medical History: Negative for: Anorexia/bulimia; Confinement Anxiety Eyes Medical History: Positive for: Cataracts - both removed Negative for: Glaucoma; Optic Neuritis Ear/Nose/Mouth/Throat Medical History: Negative for: Chronic sinus problems/congestion; Middle ear problems Hematologic/Lymphatic Medical History: Negative for: Anemia; Hemophilia; Human Immunodeficiency Virus; Lymphedema JIMEL, BRANSFORD. ([id number]) Gastrointestinal Medical History: Negative for: Cirrhosis ; Colitis; Crohnos; Hepatitis A; Hepatitis B; Hepatitis C Endocrine Medical History: Negative for: Type I Diabetes; Type II Diabetes Genitourinary Medical History: Negative for: End Stage Renal Disease Immunological Medical History: Negative for: Lupus Erythematosus; Raynaudos; Scleroderma Integumentary  (Skin) Medical History: Negative for: History of Burn; History of pressure wounds Musculoskeletal Medical History: Negative for: Gout; Rheumatoid Arthritis; Osteoarthritis; Osteomyelitis Neurologic Medical History: Positive for: Paraplegia - waist down Negative for: Dementia; Neuropathy; Quadriplegia; Seizure Disorder Oncologic Medical History: Negative for: Received Chemotherapy; Received Radiation Past Medical History Notes: Prostate cancer- currently treated with horomone therapy HBO Extended History Items Eyes: Cataracts Immunizations Pneumococcal Vaccine: Received Pneumococcal Vaccination: No Implantable Devices No devices added Family and Social History Cancer: No; Diabetes: No; Heart Disease: No; Hypertension: Yes - Father; Kidney Disease: No; Lung Disease: No; Seizures: No; Stroke: Yes - Mother; Thyroid Problems: No; Tuberculosis: No; Never smoker; Marital Status - Married; Alcohol Use: Never; Drug Use: No History; Caffeine Use: Daily TARON, WEINGARTZ ([id number]) Physician Affirmation I have reviewed and agree with the above information. Electronic Signature(s) Signed: 11/08/2018 11:56:08 PM By: Worthy Keeler [name] Signed: 03/18/2019 1:03:35 PM By: Harold Barban Entered By: [name]  III, Margarita Grizzle on 11/08/2018 10:09:34 AVIAN, GASTER ([id number]) -------------------------------------------------------------------------------- SuperBill Details Patient Name: JESS, PILLARD Date of Service: 11/08/2018 Medical Record Number: [id number] Patient Account Number: [id number] Date of Birth/Sex: [birth year] (68 y.o. 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, sequela I10 Essential (primary) hypertension Facility Procedures CPT4 Code: [id number] Description: 99213 - WOUND CARE VISIT-LEV 3 EST PT Modifier: Quantity: 1 Physician Procedures CPT4 Code: [id number] Description: [id number] - WC PHYS LEVEL 4 - EST PT ICD-10 Diagnosis Description L89.314 Pressure ulcer of right buttock, stage 4 L03.317 Cellulitis of buttock G82.20 Paraplegia, unspecified S34.109S Unspecified injury to unspecified level of lumbar spinal cor Modifier: d, sequela Quantity: 1 Electronic Signature(s) Signed: 11/08/2018 11:56:08 PM By: Worthy Keeler [name] Entered By: Worthy Keeler on 11/08/2018 10:11:58

## 2019-03-18 NOTE — Progress Notes (Signed)
[name], [name] ([id number]) Visit Report for 11/08/2018 Arrival Information Details Patient Name: [name], [name] Date of Service: 11/08/2018 9:00 AM Medical Record Number: [id number] Patient Account Number: [id number] Date of Birth/Sex: 1951-06-15 (68 y.o. M) Treating RN: Harold Barban Primary Care Danija Gosa: [name] Other Clinician: Referring Reford Olliff: [name] Treating Kyani Simkin/Extender: [name], HOYT Weeks in Treatment: 46 Visit Information History Since Last Visit Added or deleted any medications: No Patient Arrived: Wheel Chair Any new allergies or adverse reactions: No Arrival Time: 08:58 Had a fall or experienced change in No Accompanied By: wife activities of daily living that may affect Transfer Assistance: None risk of falls: Patient Identification Verified: Yes Signs or symptoms of abuse/neglect since last visito No Secondary Verification Process Completed: Yes Hospitalized since last visit: No Patient Requires Transmission-Based No Implantable device outside of the clinic excluding No Precautions: cellular tissue based products placed in the center Patient Has Alerts: Yes since last visit: Patient Alerts: NOT Has Dressing in Place as Prescribed: Yes Diabetic Pain Present Now: Yes Electronic Signature(s) Signed: 11/08/2018 12:03:16 PM By: Lorine Bears RCP, RRT, CHT Entered By: Lorine Bears on 11/08/2018 08:59:13 [name] ([id number]) -------------------------------------------------------------------------------- Clinic Level of Care Assessment Details Patient Name: [name] Date of Service: 11/08/2018 9:00 AM Medical Record Number: [id number] Patient Account Number: [id number] Date of Birth/Sex: 03-Mar-1951 (68 y.o. M) Treating RN: Harold Barban Primary Care Nollie Terlizzi: [name] Other Clinician: Referring Teala Daffron: [name] Treating Desirea Mizrahi/Extender: [name],  HOYT Weeks in Treatment: 61 Clinic Level of Care Assessment Items TOOL 4 Quantity Score []  - Use when only an EandM is performed on FOLLOW-UP visit 0 ASSESSMENTS - Nursing Assessment / Reassessment X - Reassessment of Co-morbidities (includes updates in patient status) 1 10 X- 1 5 Reassessment of Adherence to Treatment Plan ASSESSMENTS - Wound and Skin Assessment / Reassessment X - Simple Wound Assessment / Reassessment - one wound 1 5 []  - 0 Complex Wound Assessment / Reassessment - multiple wounds []  - 0 Dermatologic / Skin Assessment (not related to wound area) ASSESSMENTS - Focused Assessment []  - Circumferential Edema Measurements - multi extremities 0 []  - 0 Nutritional Assessment / Counseling / Intervention []  - 0 Lower Extremity Assessment (monofilament, tuning fork, pulses) []  - 0 Peripheral Arterial Disease Assessment (using hand held doppler) ASSESSMENTS - Ostomy and/or Continence Assessment and Care []  - Incontinence Assessment and Management 0 []  - 0 Ostomy Care Assessment and Management (repouching, etc.) PROCESS - Coordination of Care X - Simple Patient / Family Education for ongoing care 1 15 []  - 0 Complex (extensive) Patient / Family Education for ongoing care []  - 0 Staff obtains Programmer, systems, Records, Test Results / Process Orders X- 1 10 Staff telephones HHA, Nursing Homes / Clarify orders / etc []  - 0 Routine Transfer to another Facility (non-emergent condition) []  - 0 Routine Hospital Admission (non-emergent condition) []  - 0 New Admissions / Biomedical engineer / Ordering NPWT, Apligraf, etc. []  - 0 Emergency Hospital Admission (emergent condition) X- 1 10 Simple Discharge Coordination [name], CRAIGE. ([id number]) []  - 0 Complex (extensive) Discharge Coordination PROCESS - Special Needs []  - Pediatric / Minor Patient Management 0 []  - 0 Isolation Patient Management []  - 0 Hearing / Language / Visual special needs []  - 0 Assessment of  Community assistance (transportation, D/C planning, etc.) []  - 0 Additional assistance / Altered mentation []  - 0 Support Surface(s) Assessment (bed, cushion, seat, etc.) INTERVENTIONS - Wound Cleansing / Measurement X - Simple Wound Cleansing -  one wound 1 5 []  - 0 Complex Wound Cleansing - multiple wounds X- 1 5 Wound Imaging (photographs - any number of wounds) []  - 0 Wound Tracing (instead of photographs) X- 1 5 Simple Wound Measurement - one wound []  - 0 Complex Wound Measurement - multiple wounds INTERVENTIONS - Wound Dressings X - Small Wound Dressing one or multiple wounds 1 10 []  - 0 Medium Wound Dressing one or multiple wounds []  - 0 Large Wound Dressing one or multiple wounds []  - 0 Application of Medications - topical []  - 0 Application of Medications - injection INTERVENTIONS - Miscellaneous []  - External ear exam 0 []  - 0 Specimen Collection (cultures, biopsies, blood, body fluids, etc.) []  - 0 Specimen(s) / Culture(s) sent or taken to Lab for analysis []  - 0 Patient Transfer (multiple staff / Civil Service fast streamer / Similar devices) []  - 0 Simple Staple / Suture removal (25 or less) []  - 0 Complex Staple / Suture removal (26 or more) []  - 0 Hypo / Hyperglycemic Management (close monitor of Blood Glucose) []  - 0 Ankle / Brachial Index (ABI) - do not check if billed separately X- 1 5 Vital Signs [name], [name] Has the patient been seen at the hospital within the last three years: Yes Total Score: 85 Level Of Care: New/Established - Level 3 Electronic Signature(s) Signed: 03/18/2019 1:03:35 PM By: Harold Barban Entered By: Harold Barban on 11/08/2018 09:28:47 [name] ([id number]) -------------------------------------------------------------------------------- Encounter Discharge Information Details Patient Name: [name] Date of Service: 11/08/2018 9:00 AM Medical Record Number: [id number] Patient Account Number:  [id number] Date of Birth/Sex: November 08, 1950 (68 y.o. M) Treating RN: [name] Primary Care Marketa Midkiff: [name] Other Clinician: Referring Nika Yazzie: [name] Treating Gerritt Galentine/Extender: [name], HOYT Weeks in Treatment: 67 Encounter Discharge Information Items Discharge Condition: Stable Ambulatory Status: Wheelchair Discharge Destination: Home Transportation: Private Auto Accompanied By: wife Schedule Follow-up Appointment: Yes Clinical Summary of Care: Electronic Signature(s) Signed: 11/08/2018 9:51:27 AM By: Gretta Cool, BSN, RN, CWS, Kim RN, BSN Entered By: Gretta Cool, BSN, RN, CWS, Kim on 11/08/2018 09:51:27 [name], [name] ([id number]) -------------------------------------------------------------------------------- Lower Extremity Assessment Details Patient Name: [name] Date of Service: 11/08/2018 9:00 AM Medical Record Number: [id number] Patient Account Number: [id number] Date of Birth/Sex: March 02, 1951 (68 y.o. M) Treating RN: Montey Hora Primary Care Bailie Christenbury: [name] Other Clinician: Referring Latarshia Jersey: [name] Treating Clair Alfieri/Extender: [name], HOYT Weeks in Treatment: 49 Electronic Signature(s) Signed: 11/09/2018 2:55:40 PM By: Montey Hora Entered By: Montey Hora on 11/08/2018 09:14:10 [name], [name] ([id number]) -------------------------------------------------------------------------------- Multi Wound Chart Details Patient Name: [name] Date of Service: 11/08/2018 9:00 AM Medical Record Number: [id number] Patient Account Number: [id number] Date of Birth/Sex: 14-Mar-1951 (68 y.o. M) Treating RN: Harold Barban Primary Care Ranette Luckadoo: [name] Other Clinician: Referring Anella Nakata: [name] Treating Jamoni Broadfoot/Extender: [name], HOYT Weeks in Treatment: 39 Vital Signs Height(in): 73 Pulse(bpm): 60 Weight(lbs): 210 Blood Pressure(mmHg): 142/87 Body Mass Index(BMI):  28 Temperature(F): 98.4 Respiratory Rate 16 (breaths/min): Photos: [N/A:N/A] Wound Location: Right Gluteal fold N/A N/A Wounding Event: Pressure Injury N/A N/A Primary Etiology: Pressure Ulcer N/A N/A Comorbid History: Cataracts, Hypertension, N/A N/A Paraplegia Date Acquired: 11/02/2017 N/A N/A Weeks of Treatment: 49 N/A N/A Wound Status: Open N/A N/A Measurements L x W x D 2.9x2.4x4 N/A N/A (cm) Area (cm) : 5.466 N/A N/A Volume (cm) : 21.865 N/A N/A % Reduction in Area: 45.60% N/A N/A % Reduction in Volume: -2075.60% N/A N/A Starting Position 1 12 (o'clock): Ending Position 1  3 (o'clock): Maximum Distance 1 (cm): 5.8 Undermining: Yes N/A N/A Classification: Category/Stage IV N/A N/A Exudate Amount: Medium N/A N/A Exudate Type: Serous N/A N/A Exudate Color: amber N/A N/A Wound Margin: Epibole N/A N/A Granulation Amount: Large (67-100%) N/A N/A Granulation Quality: Red, Pink, Hyper-granulation N/A N/A Necrotic Amount: Small (1-33%) N/A N/A Exposed Structures: N/A N/A [name], [name] ([name]) Fat Layer (Subcutaneous Tissue) Exposed: Yes Fascia: No Tendon: No Muscle: No Joint: No Bone: No Epithelialization: None N/A N/A Treatment Notes Electronic Signature(s) Signed: 03/18/2019 1:03:35 PM By: Harold Barban Entered By: Harold Barban on 11/08/2018 09:22:43 [name] ([name]) -------------------------------------------------------------------------------- [place] Details Patient Name: [name] Date of Service: 11/08/2018 9:00 AM Medical Record Number: [name] Patient Account Number: [id number] Date of Birth/Sex: [birth year] (68 y.o. M) Treating RN: Harold Barban Primary Care Ninetta Adelstein: [name] Other Clinician: Referring Dermot Gremillion: [name] Treating Jahlen Bollman/Extender: [name], HOYT Weeks in Treatment: 32 Active Inactive Orientation to the Wound Care Program Nursing  Diagnoses: Knowledge deficit related to the wound healing center program Goals: Patient/caregiver will verbalize understanding of the [facility] Program Date Initiated: 11/30/2017 Target Resolution Date: 12/21/2017 Goal Status: Active Interventions: Provide education on orientation to the wound center Notes: Pressure Nursing Diagnoses: Knowledge deficit related to management of pressures ulcers Goals: Patient/caregiver will verbalize understanding of pressure ulcer management Date Initiated: 05/17/2018 Target Resolution Date: 05/28/2018 Goal Status: Active Interventions: Assess: immobility, friction, shearing, incontinence upon admission and as needed Notes: Wound/Skin Impairment Nursing Diagnoses: Impaired tissue integrity Goals: Patient/caregiver will verbalize understanding of skin care regimen Date Initiated: 11/30/2017 Target Resolution Date: 12/21/2017 Goal Status: Active Ulcer/skin breakdown will have a volume reduction of 30% by week 4 Date Initiated: 11/30/2017 Target Resolution Date: 12/21/2017 Goal Status: Active [name], [name] ([name]) Interventions: Assess patient/caregiver ability to obtain necessary supplies Assess patient/caregiver ability to perform ulcer/skin care regimen upon admission and as needed Assess ulceration(s) every visit Treatment Activities: Skin care regimen initiated : 11/30/2017 Notes: Electronic Signature(s) Signed: 03/18/2019 1:03:35 PM By: Harold Barban Entered By: Harold Barban on 11/08/2018 09:22:33 [name] ([name]) -------------------------------------------------------------------------------- Pain Assessment Details Patient Name: [name] Date of Service: 11/08/2018 9:00 AM Medical Record Number: [name] Patient Account Number: [id number] Date of Birth/Sex: 10/09/1950 (68 y.o. M) Treating RN: Harold Barban Primary Care Brennan Litzinger: [name] Other Clinician: Referring Shaylynne Lunt:  [name] Treating Zyrell Carmean/Extender: [name], HOYT Weeks in Treatment: 22 Active Problems Location of Pain Severity and Description of Pain Patient Has Paino Yes Site Locations Rate the pain. Current Pain Level: 6 Pain Management and Medication Current Pain Management: Electronic Signature(s) Signed: 11/08/2018 12:03:16 PM By: Lorine Bears RCP, RRT, CHT Signed: 03/18/2019 1:03:35 PM By: Harold Barban Entered By: Lorine Bears on 11/08/2018 08:59:25 [name], [name] ([name]) -------------------------------------------------------------------------------- Patient/Caregiver Education Details Patient Name: [name] Date of Service: 11/08/2018 9:00 AM Medical Record Number: [name] Patient Account Number: [id number] Date of Birth/Gender: 1951-02-02 (68 y.o. M) Treating RN: Harold Barban Primary Care Physician: [name] Other Clinician: Referring Physician: [name] Treating Physician/Extender: Sharalyn Ink in Treatment: 68 Education Assessment Education Provided To: Patient Education Topics Provided Wound/Skin Impairment: Handouts: Caring for Your Ulcer Methods: Demonstration, Explain/Verbal Responses: State content correctly Electronic Signature(s) Signed: 03/18/2019 1:03:35 PM By: Harold Barban Entered By: Harold Barban on 11/08/2018 09:23:08 [name] ([name]) -------------------------------------------------------------------------------- Wound Assessment Details Patient Name: [name] Date of Service: 11/08/2018 9:00 AM Medical Record Number: [name] Patient Account Number: [id number] Date of Birth/Sex: [birth year] (  68 y.o. M) Treating RN: Montey Hora Primary Care Adar Rase: [name] Other Clinician: Referring Bayler Nehring: [name] Treating Aryaman Haliburton/Extender: [name], HOYT Weeks in Treatment: 49 Wound Status Wound Number: 1 Primary  Etiology: Pressure Ulcer Wound Location: Right Gluteal fold Wound Status: Open Wounding Event: Pressure Injury Comorbid History: Cataracts, Hypertension, Paraplegia Date Acquired: 11/02/2017 Weeks Of Treatment: 49 Clustered Wound: No Photos Wound Measurements Length: (cm) 2.9 Width: (cm) 2.4 Depth: (cm) 4 Area: (cm) 5.466 Volume: (cm) 21.865 % Reduction in Area: 45.6% % Reduction in Volume: -2075.6% Epithelialization: None Tunneling: No Undermining: Yes Starting Position (o'clock): 12 Ending Position (o'clock): 3 Maximum Distance: (cm) 5.8 Wound Description Classification: Category/Stage IV Foul Odo Wound Margin: Epibole Slough/F Exudate Amount: Medium Exudate Type: Serous Exudate Color: amber r After Cleansing: No ibrino Yes Wound Bed Granulation Amount: Large (67-100%) Exposed Structure Granulation Quality: Red, Pink, Hyper-granulation Fascia Exposed: No Necrotic Amount: Small (1-33%) Fat Layer (Subcutaneous Tissue) Exposed: Yes Necrotic Quality: Adherent Slough Tendon Exposed: No Muscle Exposed: No Joint Exposed: No [name], GUINANE. ([name]) Bone Exposed: No Periwound Skin Texture Texture Color No Abnormalities Noted: No No Abnormalities Noted: No Callus: No Atrophie Blanche: No Crepitus: No Cyanosis: No Excoriation: No Ecchymosis: No Induration: No Erythema: No Rash: Yes Hemosiderin Staining: No Scarring: No Mottled: No Pallor: No Moisture Rubor: No No Abnormalities Noted: No Dry / Scaly: No Temperature / Pain Maceration: Yes Temperature: No Abnormality Tenderness on Palpation: Yes Electronic Signature(s) Signed: 11/09/2018 2:55:40 PM By: Montey Hora Entered By: Montey Hora on 11/08/2018 09:13:49 [name] ([name]) -------------------------------------------------------------------------------- Vitals Details Patient Name: [name] Date of Service: 11/08/2018 9:00 AM Medical Record Number:  [name] Patient Account Number: [id number] Date of Birth/Sex: 24-Oct-1950 (68 y.o. M) Treating RN: Harold Barban Primary Care Mireya Meditz: [name] Other Clinician: Referring Kashia Brossard: [name] Treating Lytle Malburg/Extender: [name], HOYT Weeks in Treatment: 49 Vital Signs Time Taken: 08:59 Temperature (F): 98.4 Height (in): 73 Pulse (bpm): 60 Weight (lbs): 210 Respiratory Rate (breaths/min): 16 Body Mass Index (BMI): 27.7 Blood Pressure (mmHg): 142/87 Reference Range: 80 - 120 mg / dl Electronic Signature(s) Signed: 11/08/2018 12:03:16 PM By: Lorine Bears RCP, RRT, CHT Entered By: Lorine Bears on 11/08/2018 09:01:42

## 2019-03-22 ENCOUNTER — Encounter: Payer: Medicare Other | Admitting: Physician Assistant

## 2019-03-24 ENCOUNTER — Other Ambulatory Visit: Payer: Self-pay

## 2019-03-24 ENCOUNTER — Encounter: Payer: Medicare Other | Attending: Physician Assistant | Admitting: Physician Assistant

## 2019-03-24 DIAGNOSIS — L89314 Pressure ulcer of right buttock, stage 4: Secondary | ICD-10-CM | POA: Insufficient documentation

## 2019-03-24 DIAGNOSIS — Z881 Allergy status to other antibiotic agents status: Secondary | ICD-10-CM | POA: Diagnosis not present

## 2019-03-24 DIAGNOSIS — I10 Essential (primary) hypertension: Secondary | ICD-10-CM | POA: Diagnosis not present

## 2019-03-24 DIAGNOSIS — C61 Malignant neoplasm of prostate: Secondary | ICD-10-CM | POA: Insufficient documentation

## 2019-03-24 DIAGNOSIS — G8220 Paraplegia, unspecified: Secondary | ICD-10-CM | POA: Diagnosis not present

## 2019-03-24 DIAGNOSIS — Z79899 Other long term (current) drug therapy: Secondary | ICD-10-CM | POA: Insufficient documentation

## 2019-03-24 DIAGNOSIS — Z8249 Family history of ischemic heart disease and other diseases of the circulatory system: Secondary | ICD-10-CM | POA: Diagnosis not present

## 2019-03-24 DIAGNOSIS — L03317 Cellulitis of buttock: Secondary | ICD-10-CM | POA: Insufficient documentation

## 2019-03-24 DIAGNOSIS — M8668 Other chronic osteomyelitis, other site: Secondary | ICD-10-CM | POA: Insufficient documentation

## 2019-03-24 DIAGNOSIS — Z880 Allergy status to penicillin: Secondary | ICD-10-CM | POA: Insufficient documentation

## 2019-03-26 NOTE — Progress Notes (Signed)
[name] ([id number]) Visit Report for 03/24/2019 Arrival Information Details Patient Name: [name], [name] Date of Service: 03/24/2019 8:00 AM Medical Record Number: [id number] Patient Account Number: [id number] Date of Birth/Sex: [birth year] (68 y.o. Male) Treating RN: Army [name] Primary Care [name]: [name] Other Clinician: Referring [name]: [name] Treating Corinthia Helmers/Extender: [name], [name] Weeks in Treatment: 71 Visit Information History Since Last Visit Added or deleted any medications: No Patient Arrived: Wheel Chair Any new allergies or adverse reactions: No Arrival Time: 08:05 Had a fall or experienced change in No Accompanied By: wife activities of daily living that may affect Transfer Assistance: Manual risk of falls: Patient Identification Verified: Yes Signs or symptoms of abuse/neglect since last visito No Secondary Verification Process Completed: Yes Hospitalized since last visit: No Patient Requires Transmission-Based No Implantable device outside of the clinic excluding No Precautions: cellular tissue based products placed in the center Patient Has Alerts: Yes since last visit: Patient Alerts: NOT Has Dressing in Place as Prescribed: Yes Diabetic Pain Present Now: Yes Electronic Signature(s) Signed: 03/24/2019 3:22:37 PM By: [name] RCP, RRT, CHT Entered By: [name] on 03/24/2019 08:06:04 [name] ([id number]) -------------------------------------------------------------------------------- Encounter Discharge Information Details Patient Name: [name] Date of Service: 03/24/2019 8:00 AM Medical Record Number: [id number] Patient Account Number: [id number] Date of Birth/Sex: 1951-03-19 (68 y.o. Male) Treating RN: Army [name] Primary Care [name]: [name] Other Clinician: Referring [name]: [name] Treating [name]: [name],  [name] Weeks in Treatment: 72 Encounter Discharge Information Items Post Procedure Vitals Discharge Condition: Stable Temperature (F): 98.8 Ambulatory Status: Wheelchair Pulse (bpm): 72 Discharge Destination: Home Respiratory Rate (breaths/min): 16 Transportation: Private Auto Blood Pressure (mmHg): 130/75 Accompanied By: wife Schedule Follow-up Appointment: Yes Clinical Summary of Care: Electronic Signature(s) Signed: 03/24/2019 12:59:19 PM By: Army [name] Entered By: Army [name] on 03/24/2019 08:36:30 [name] ([id number]) -------------------------------------------------------------------------------- Lower Extremity Assessment Details Patient Name: [name] Date of Service: 03/24/2019 8:00 AM Medical Record Number: [id number] Patient Account Number: [id number] Date of Birth/Sex: 1951/02/18 (68 y.o. Male) Treating RN: Montey Hora Primary Care Diandre Merica: [name] Other Clinician: Referring [name]: [name] Treating [name]: [name], [name] Weeks in Treatment: 64 Electronic Signature(s) Signed: 03/24/2019 4:26:35 PM By: Montey Hora Entered By: Montey Hora on 03/24/2019 08:10:01 [name] ([id number]) -------------------------------------------------------------------------------- Multi Wound Chart Details Patient Name: [name] Date of Service: 03/24/2019 8:00 AM Medical Record Number: [id number] Patient Account Number: [id number] Date of Birth/Sex: [birth year] (68 y.o. Male) Treating RN: Army [name] Primary Care Malika Demario: [name] Other Clinician: Referring Cletis Clack: [name] Treating Camela Wich/Extender: [name], [name] Weeks in Treatment: 72 Vital Signs Height(in): 73 Pulse(bpm): 72 Weight(lbs): 210 Blood Pressure(mmHg): 130/75 Body Mass Index(BMI): 28 Temperature(F): 98.8 Respiratory Rate 16 (breaths/min): Photos: [N/A:N/A] Wound Location: Right Gluteal fold N/A  N/A Wounding Event: Pressure Injury N/A N/A Primary Etiology: Pressure Ulcer N/A N/A Comorbid History: Cataracts, Hypertension, N/A N/A Paraplegia Date Acquired: 11/02/2017 N/A N/A Weeks of Treatment: 68 N/A N/A Wound Status: Open N/A N/A Measurements L x W x D 3.4x2.6x2.9 N/A N/A (cm) Area (cm) : 6.943 N/A N/A Volume (cm) : 20.134 N/A N/A % Reduction in Area: 30.90% N/A N/A % Reduction in Volume: -1903.40% N/A N/A Classification: Category/Stage IV N/A N/A Exudate Amount: Large N/A N/A Exudate Type: Serosanguineous N/A N/A Exudate Color: red, brown N/A N/A Wound Margin: Epibole N/A N/A Granulation Amount: Medium (34-66%) N/A N/A Granulation Quality: Red, Pink, Hyper-granulation N/A N/A Necrotic Amount: Medium (34-66%) N/A N/A Exposed  Structures: Fat Layer (Subcutaneous N/A N/A Tissue) Exposed: Yes Muscle: Yes Fascia: No Tendon: No Joint: No Bone: No [name]. ([id number]) Epithelialization: None N/A N/A Treatment Notes Electronic Signature(s) Signed: 03/24/2019 12:59:19 PM By: Army [name] Entered By: Army [name] on 03/24/2019 08:26:33 [name] ([id number]) -------------------------------------------------------------------------------- [place] Details Patient Name: [name] Date of Service: 03/24/2019 8:00 AM Medical Record Number: [id number] Patient Account Number: [id number] Date of Birth/Sex: 07/17/1951 (68 y.o. Male) Treating RN: Army [name] Primary Care Abdoul Encinas: [name] Other Clinician: Referring Taqwa Deem: [name] Treating [name]: [name], [name] Weeks in Treatment: 74 Active Inactive Orientation to the Wound Care Program Nursing Diagnoses: Knowledge deficit related to the wound healing center program Goals: Patient/caregiver will verbalize understanding of the [place] Program Date Initiated: 11/30/2017 Target Resolution Date: 12/21/2017 Goal Status:  Active Interventions: Provide education on orientation to the wound center Notes: Pressure Nursing Diagnoses: Knowledge deficit related to management of pressures ulcers Goals: Patient/caregiver will verbalize understanding of pressure ulcer management Date Initiated: 05/17/2018 Target Resolution Date: 05/28/2018 Goal Status: Active Interventions: Assess: immobility, friction, shearing, incontinence upon admission and as needed Notes: Wound/Skin Impairment Nursing Diagnoses: Impaired tissue integrity Goals: Patient/caregiver will verbalize understanding of skin care regimen Date Initiated: 11/30/2017 Target Resolution Date: 12/21/2017 Goal Status: Active Ulcer/skin breakdown will have a volume reduction of 30% by week 4 Date Initiated: 11/30/2017 Target Resolution Date: 12/21/2017 Goal Status: Active CAZ, WESTERMEYER ([id number]) Interventions: Assess patient/caregiver ability to obtain necessary supplies Assess patient/caregiver ability to perform ulcer/skin care regimen upon admission and as needed Assess ulceration(s) every visit Treatment Activities: Skin care regimen initiated : 11/30/2017 Notes: Electronic Signature(s) Signed: 03/24/2019 12:59:19 PM By: Army [name] Entered By: Army [name] on 03/24/2019 08:26:25 TYRIQ, COULTHARD ([id number]) -------------------------------------------------------------------------------- Pain Assessment Details Patient Name: [name] Date of Service: 03/24/2019 8:00 AM Medical Record Number: [id number] Patient Account Number: [id number] Date of Birth/Sex: September 12, 1950 (68 y.o. Male) Treating RN: Army [name] Primary Care Aureliano Oshields: [name] Other Clinician: Referring Fayez Sturgell: [name] Treating Alley Neils/Extender: [name], [name] Weeks in Treatment: 68 Active Problems Location of Pain Severity and Description of Pain Patient Has Paino Yes Site Locations Rate the pain. Current Pain Level: 8 Pain  Management and Medication Current Pain Management: Electronic Signature(s) Signed: 03/24/2019 12:59:19 PM By: Army [name] Signed: 03/24/2019 3:22:37 PM By: Brett Sax, Sallie RCP, RRT, CHT Entered By: [name] on 03/24/2019 08:06:16 THA, JOSHLIN ([id number]) -------------------------------------------------------------------------------- Patient/Caregiver Education Details Patient Name: [name] Date of Service: 03/24/2019 8:00 AM Medical Record Number: [id number] Patient Account Number: [id number] Date of Birth/Gender: Nov 09, 1950 (68 y.o. Male) Treating RN: Army [name] Primary Care Physician: [name] Other Clinician: Referring Physician: [name] Treating Physician/Extender: Sharalyn Ink in Treatment: 74 Education Assessment Education Provided To: Patient Education Topics Provided Wound/Skin Impairment: Handouts: Caring for Your Ulcer Methods: Demonstration, Explain/Verbal Responses: State content correctly Electronic Signature(s) Signed: 03/24/2019 12:59:19 PM By: Army [name] Entered By: Army [name] on 03/24/2019 08:35:35 Breese, [name] ([id number]) -------------------------------------------------------------------------------- Wound Assessment Details Patient Name: [name] Date of Service: 03/24/2019 8:00 AM Medical Record Number: [id number] Patient Account Number: [id number] Date of Birth/Sex: [birth year] (68 y.o. Male) Treating RN: Montey Hora Primary Care Giannis Corpuz: [name] Other Clinician: Referring Luellen Howson: [name] Treating Manaal Mandala/Extender: [name], [name] Weeks in Treatment: 68 Wound Status Wound Number: 1 Primary Etiology: Pressure Ulcer Wound Location: Right Gluteal fold Wound Status: Open Wounding Event: Pressure Injury Comorbid History: Cataracts, Hypertension, Paraplegia Date Acquired: 11/02/2017  Weeks Of Treatment: 68 Clustered Wound:  No Photos Wound Measurements Length: (cm) 3.4 Width: (cm) 2.6 Depth: (cm) 2.9 Area: (cm) 6.943 Volume: (cm) 20.134 % Reduction in Area: 30.9% % Reduction in Volume: -1903.4% Epithelialization: None Tunneling: No Undermining: No Wound Description Classification: Category/Stage IV Foul Odo Wound Margin: Epibole Slough/F Exudate Amount: Large Exudate Type: Serosanguineous Exudate Color: red, brown r After Cleansing: No ibrino Yes Wound Bed Granulation Amount: Medium (34-66%) Exposed Structure Granulation Quality: Red, Pink, Hyper-granulation Fascia Exposed: No Necrotic Amount: Medium (34-66%) Fat Layer (Subcutaneous Tissue) Exposed: Yes Necrotic Quality: Adherent Slough Tendon Exposed: No Muscle Exposed: Yes Necrosis of Muscle: No Joint Exposed: No Bone Exposed: No Treatment Notes LINNELL, LUEDEMAN. ([id number]) Wound #1 (Right Gluteal fold) Notes zinc to peri wound, Hydrafera Blue, xtrasorb and tegaderm Electronic Signature(s) Signed: 03/24/2019 4:26:35 PM By: Montey Hora Entered By: Montey Hora on 03/24/2019 08:16:08 [name] ([id number]) -------------------------------------------------------------------------------- Vitals Details Patient Name: [name] Date of Service: 03/24/2019 8:00 AM Medical Record Number: [id number] Patient Account Number: [id number] Date of Birth/Sex: Dec 12, 1950 (68 y.o. Male) Treating RN: Army [name] Primary Care Gailene Youkhana: [name] Other Clinician: Referring Adelena Desantiago: [name] Treating Julyan Gales/Extender: [name], [name] Weeks in Treatment: 68 Vital Signs Time Taken: 08:04 Temperature (F): 98.8 Height (in): 73 Pulse (bpm): 72 Weight (lbs): 210 Respiratory Rate (breaths/min): 16 Body Mass Index (BMI): 27.7 Blood Pressure (mmHg): 130/75 Reference Range: 80 - 120 mg / dl Electronic Signature(s) Signed: 03/24/2019 3:22:37 PM By: [name] RCP, RRT, CHT Entered By:  [name] on 03/24/2019 08:06:43

## 2019-03-26 NOTE — Progress Notes (Signed)
ELIZER, SZYMBORSKI ([id number]) Visit Report for 03/24/2019 Chief Complaint Document Details Patient Name: [name], [name] Date of Service: 03/24/2019 8:00 AM Medical Record Number: [id number] Patient Account Number: [id number] Date of Birth/Sex: 1951/03/09 (68 y.o. Male) Treating RN: Army Melia Primary Care Provider: [name] Other Clinician: Referring Provider: [name] Treating Provider/Extender: [name], HOYT Weeks in Treatment: 31 Information Obtained from: Patient Chief Complaint Right gluteal fold ulcer Electronic Signature(s) Signed: 03/25/2019 7:48:01 PM By: Worthy Keeler [name] Entered By: Worthy Keeler on 03/24/2019 08:24:57 [name], [name] ([id number]) -------------------------------------------------------------------------------- Debridement Details Patient Name: [name] Date of Service: 03/24/2019 8:00 AM Medical Record Number: [id number] Patient Account Number: [id number] Date of Birth/Sex: Dec 14, 1950 (68 y.o. Male) Treating RN: Army Melia Primary Care Provider: [name] Other Clinician: Referring Provider: [name] Treating Provider/Extender: [name], HOYT Weeks in Treatment: 68 Debridement Performed for Wound #1 Right Gluteal fold Assessment: Performed By: Physician [name], HOYT E., [name] Debridement Type: Debridement Level of Consciousness (Pre- Awake and Alert procedure): Pre-procedure Verification/Time Yes - 08:29 Out Taken: Start Time: 08:29 Total Area Debrided (L x W): 1 (cm) x 1 (cm) = 1 (cm) Tissue and other material Viable, Non-Viable, Subcutaneous, Hyper-granulation debrided: Level: Skin/Subcutaneous Tissue Debridement Description: Excisional Instrument: Curette Bleeding: Moderate Hemostasis Achieved: Silver Nitrate End Time: 08:31 Response to Treatment: Procedure was tolerated well Level of Consciousness Awake and Alert (Post-procedure): Post Debridement Measurements of Total  Wound Length: (cm) 3.4 Stage: Category/Stage IV Width: (cm) 2.6 Depth: (cm) 2.9 Volume: (cm) 20.134 Character of Wound/Ulcer Post Stable Debridement: Post Procedure Diagnosis Same as Pre-procedure Electronic Signature(s) Signed: 03/24/2019 12:59:19 PM By: Army Melia Signed: 03/25/2019 7:48:01 PM By: Worthy Keeler [name] Entered By: Army Melia on 03/24/2019 08:33:10 [name], [name] ([id number]) -------------------------------------------------------------------------------- HPI Details Patient Name: [name] Date of Service: 03/24/2019 8:00 AM Medical Record Number: [id number] Patient Account Number: [id number] Date of Birth/Sex: September 12, 1950 (68 y.o. 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 He states that there is no history of diabetes that he is aware of. He does have issues with his prostate and is currently receiving treatment for this by way of oral medication. With that being said I do not have a lot of details in that regard. Nonetheless the patient presents today as a result of having been referred to [place] by another provider initially home health was set to come out and take care of his wound although due to the fact that he apparently drives he's not able to receive home health. His wife is therefore trying to help take care of this wound within although they have been struggling with what exactly to do at this point. She states that she can do some things but she is definitely not a nurse and does have some issues  with looking at blood. The good news is the wound does not appear to be too deep and is fairly superficial at this point. There is no slough noted there is some nonviable skin noted around the surface of the wound and the perimeter at this point. The central portion of  the wound appears to be very good with a dermal layer noted this does not appear to be again deep enough to extend it to subcutaneous tissue at this point. 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 In the pump he has Dilaudid and baclofen. 12/07/17 on evaluation today patient presents for follow-up concerning his ongoing lower back thigh ulcer on the right. He states that he did not get the supplies ordered and therefore has not really been able to perform the dressing changes as directed exactly. His wife was able to get some Boarder Foam Dressing's from the drugstore and subsequently has been using hydrogel which did help to a degree in the wound does appear to be able smaller. There is actually more drainage this week noted than previous. 12/21/17 on evaluation today patient appears to be doing rather well in regard to his right gluteal ulcer. He has been tolerating the dressing changes without complication. There does not appear to be any evidence of infection at this point in time. Overall the wound does seem to be making some progress as far as the edges are concerned there's not as much in the way of overlapping of the external wound edges and he has a good epithelium to wound bed border for the most part. This however is not true right at the 12 o'clock location over the span of a little over a centimeters which actually will require debridement today to clean this away and hopefully allow it to continue to heal more appropriately. 12/28/17 on evaluation today patient appears to be doing rather well in regard to his ulcer in  the left gluteal region. He's been tolerating the dressing changes without complication. Apparently he has had some difficulty getting his dressing material. Apparently there's been some confusion with ordering we're gonna check into this. Nonetheless overall he's been showing signs of improvement which is good news. Debridement is not required today. 01/04/18 on evaluation today patient presents for follow-up concerning his right gluteal ulcer. He has been tolerating the dressing changes fairly well. On inspection today it appears he may actually have some maceration them concerned about the fact that he may be developing too much moisture in and around the wound bed which can cause delay in healing. With that being said he unfortunately really has not showed significant signs of improvement since last week's evaluation in fact this may even be just the little bit/slightly larger. Nonetheless he's been having a lot of discomfort I'm not sure this is even related to the wound as he has no pain when I'm to breeding or otherwise cleaning the wound during evaluation today. Nonetheless this is something that we did recommend he talked to his pain specialist concerning. 01/11/18 on evaluation today patient appears to be doing better in regard to his ulceration. He has been tolerating the dressing changes without complication. With that being said overall there's no evidence of infection which is good news. The only thing is he did receive the hatch affair blue classic versus the ready nonetheless I feel like this is perfectly fine and appears to have done well for him over the past week. 01/25/18 on evaluation today patient's wound actually appears to be a little bit larger than during the last evaluation. The good [name], [name]. ([id number]) news is the majority of the wound edges actually appear to be fairly firmly attached to the wound  bed unfortunately again we're not really making progress in  regard to the size. Roughly the wound is about the same size as when I first saw him although again the wound margin/edges appear to be much better. 02/01/18 on evaluation today patient actually appears to be doing very well in regard to his wound. Applying the Prisma dry does seem to be better although he does still have issues with slow progression of the wound. There was a slight improvement compared to last week's measurements today. Nonetheless I have been considering other options as far as the possibility of Theraskin or even a snap vac. In general I'm not sure that the Theraskin due to location of the wound would be a very good idea. Nonetheless I do think that a snap vac could be a possibility for the patient and in fact I think this could even be an excellent way to manage the wound possibly seeing some improvement in a very rapid fashion here. Nonetheless this is something that we would need to get approved and I did have a lengthy conversation with the patient about this today. 02/08/18 on evaluation today patient appears to be doing a little better in regard to his ulcer. He has been tolerating the dressing changes without complication. Fortunately despite the fact that the wound is a little bit smaller it's not significantly so unfortunately. We have discussed the possibility of a snap vac we did check with insurance this is actually covered at this point. Fortunately there does not appear to be any sign of infection. Overall I'm fairly pleased with how things seem to be appearing at this point. 02/15/18 on evaluation today patient appears to be doing rather well in regard to his right gluteal ulcer. Unfortunately the snap vac did not stay in place with his sheer and friction this came loose and did not seem to maintain seal very well. He worked for about two days and it did seem to do very well during that time according to his wife but in general this does not seem to be something  that's gonna be beneficial for him long-term. I do believe we need to go back to standard dressings to see if we can find something that will be of benefit. 03/02/18- He is here in follow up evaluation; there is minimal change in the wound. He will continue with the same treatment plan, would consider changing to iodosrob/iodoflex if ulcer continues to to plateau. He will follow up next week 03/08/18 on evaluation today patient's wound actually appears to be about the same size as when I previously saw him several weeks back. Unfortunately he does have some slightly dark discoloration in the central portion of the wound which has me concerned about pressure injury. I do believe he may be sitting for too long a period of time in fact he tells me that "I probably sit for much too long". He does have some Slough noted on the surface of the wound and again as far as the size of the wound is concerned I'm really not seeing anything that seems to have improved significantly. 03/15/18 on evaluation today patient appears to be doing fairly well in regard to his ulcer. The wound measured pretty much about the same today compared to last week's evaluation when looking at his graph. With that being said the area of bruising/deep tissue injury that was noted last week I do not see at this point. He did get a new cushion fortunately this does seem  to be have been of benefit in my pinion. It does appear that he's been off of this more which is good news as well I think that is definitely showing in the overall wound measurements. With that being said I do believe that he needs to continue to offload I don't think that the fact this is doing better should be or is going to allow him to not have to offload and explain this to him as well. Overall he seems to be in agreement the plan I think he understands. The overall appearance of the wound bed is improved compared to last week I think the Iodoflex has been beneficial  in that regard. 03/29/18 on evaluation today patient actually appears to be doing rather well in regard to his wound from the overall appearance standpoint he does have some granulation although there's some Slough on the surface of the wound noted as well. With that being said he unfortunately has not improved in regard to the overall measurement of the wound in volume or in size. I did have a discussion with him very specifically about offloading today. He actually does work although he mainly is just sitting throughout the day. He tells me he offloads by "lifting himself up for 30 seconds off of his chair occasionally" purchase from [employer] which does seem to have helped. And he has a new cushion that he with that being said he's also able to stand some for a very short period of time but not significant enough I think to provide appropriate offloading. I think the biggest issue at this point with the wound and the fact is not healing as quickly as we would like is due to the fact that he is really not able to appropriately offload while at work. He states the beginning after his injury he actually had a bed at his job that he could lay on in order to offload and that does seem to have been of help back at that time. Nonetheless he had not done this in quite some time unfortunately. I think that could be helpful for him this is something I would like for him to look into. 04/05/18 on evaluation today patient actually presents for follow-up concerning his right gluteal ulcer. Again he really is not significantly improved even compared to last week. He has been tolerating the dressing changes without complication. With that being said fortunately there appears to be no evidence of infection at this time. He has been more proactive in trying to offload. [name], [name] ([id number]) 04/12/18 on evaluation today patient actually appears to be doing a little better in regard to his wound and  the right gluteal fold region. He's been tolerating the dressing changes since removing the oasis without complication. However he was having a lot of burning initially with the oasis in place. He's unsure of exactly why this was given so much discomfort but he assumes that it was the oasis itself causing the problem. Nonetheless this had to be removed after about three days in place although even those three days seem to have made a fairly good improvement in regard to the overall appearance of the wound bed. In fact is the first time that he's made any improvement from the standpoint of measurements in about six weeks. He continues to have no discomfort over the area of the wound itself which leads me to wonder why he was having the burning with the oasis when he does not even feel  the actual debridement's themselves. I am somewhat perplexed by this. 04/19/18 on evaluation today patient's wound actually appears to be showing signs of epithelialization around the edge of the wound and in general actually appears to be doing better which is good news. He did have the same burning after about three days with applying the Endoform last week in the same fashion that I would generally apply a skin substitute. This seems to indicate that it's not the oasis to cause the problem but potentially the moisture buildup that just causes things to burn or there may be some other reaction with the skin prep or Steri-Strips. Nonetheless I'm not sure that is gonna be able to tolerate any skin substitute for a long period of time. The good news is the wound actually appears to be doing better today compared to last week and does seem to finally be making some progress. 04/26/18 on evaluation today patient actually appears to be doing rather well in regard to his ulcer in the right gluteal fold. He has been tolerating the dressing changes without complication which is good news. The Endoform does seem to be  helping although he was a little bit more macerated this week. This seems to be an ongoing issue with fluid control at this point. Nonetheless I think we may be able to add something like Drawtex to help control the drainage. 05/03/18 on evaluation today patient appears to actually be doing better in regard to the overall appearance of his wound. He has been tolerating the dressing changes without complication. Fortunately there appears to be no evidence of infection at this time. I really feel like his wound has shown signs as of today of turning around last week I thought so as well and definitely he could be seen in this week's overall appearance and measurements. In general I'm very pleased with the fact that he finally seems to be making a steady but sure progress. The patient likewise is very pleased. 05/17/18 on evaluation today patient appears to be doing more poorly unfortunately in regard to his ulcer. He has been tolerating the dressing changes without complication. With that being said he tells me that in the past couple of days he and his wife have noticed that we did not seem to be doing quite as well is getting dark near the center. Subsequently upon evaluation today the wound actually does appear to be doing worse compared to previous. He has been tolerating the dressing changes otherwise and he states that he is not been sitting up anymore than he was in the past from what he tells me. Still he has continued to work he states "I'm tired of dealing with this and if I have to just go home and lay in the bed all the time that's what I'll do". Nonetheless I am concerned about the fact that this wound does appear to be deeper than what it was previous. 05/24/18 upon evaluation today patient actually presents after having been in the hospital due to what was presumed to be sepsis secondary to the wound infection. He had an elevated white blood cell count between 14 and 15. With that being  said he does seem to be doing somewhat better now. His wound still is giving him some trouble nonetheless and he is obviously concerned about the fact likely talked about that this does seem to go more deeply than previously noted. I did review his wound culture which showed evidence of Staphylococcus aureus him and group  B strep. Nonetheless he is on antibiotics, Levaquin, for this. Subsequently I did review his intake summary from the hospital as well. I also did look at the CT of the lumbar spine with contrast that was performed which showed no bone destruction to suggest lumbar disguises/osteomyelitis or sacral osteomyelitis. There was no paraspinal abscess. Nonetheless it appears this may have been more of just a soft tissue infection at this point which is good news. He still is nonetheless concerned about the wound which again I think is completely reasonable considering everything he's been through recently. 05/31/18 on evaluation today on evaluation today patient actually appears to be showing signs of his wound be a little bit deeper than what I would like to see. Fortunately he does not show any signs of significant infection although his temperature was 99 today he states he's been checking this at home and has not been elevated. Nonetheless with the undermining that I'm seeing at this point I am becoming more concerned about the wound I do think that offloading is a key factor here that is preventing the speedy recovery at this point. There does not appear to be any evidence of again over infection noted. He's been using Santyl currently. 06/07/18 the patient presents today for follow-up evaluation regarding the left ulcer in the gluteal region. He has been tolerating the Wound VAC fairly well. He is obviously very frustrated with this he states that to mean is really getting in his way. There does not appear to be any evidence of infection at this time he does have a little bit of odor  I do not necessarily associate this with infection just something that we sometimes notice with Wound VAC therapy. With that being said I can definitely catch a tone of discontentment overall in the patient's demeanor today. This when he was previously in the hospital [name], [name]. ([id number]) an CT scan was done of the lumbar region which did not reveal any signs of osteomyelitis. With that being said the pelvis in particular was not evaluated distinctly which means he could still have some osteonecrosis I. Nonetheless the Wound VAC was started on Thursday I do want to get this little bit more time before jumping to a CT scan of the pelvis although that is something that I might would recommend if were not see an improvement by that time. 06/14/18 on evaluation today patient actually appears to be doing about the same in regard to his right gluteal ulcer. Again he did have a CT scan of the lumbar spine unfortunately this did not include the pelvis. Nonetheless with the depth of the wound that I'm seeing today even despite the fact that I'm not seeing any evidence of overt cellulitis I believe there's a good chance that we may be dealing with osteomyelitis somewhere in the right Ischial region. No fevers, chills, nausea, or vomiting noted at this time. 06/21/18 on evaluation today patient actually appears to be doing about the same with regard to his wound. The tunnel at 6 o'clock really does not appear to be any deeper although it is a little bit wider. I think at this point you may want to start packing this with white phone. Unfortunately I have not got approval for the CT scan of the pelvis as of yet due to the fact that Medicare apparently has been denied it due to the diagnosis codes not being appropriate according to Medicare for the test requested. With that being said the patient cannot have  an MRI and therefore this is the only option that we have as far as testing is concerned. The  patient has had infection and was on antibiotics and been added code for cellulitis of the bottom to see if this will be appropriate for getting the test approved. Nonetheless I'm concerned about the infection have been spread deeper into the Ischial region. 06/28/18 on evaluation today patient actually appears to be doing rather well all things considered in regard to the right gluteal ulcer. He has been tolerating the dressing changes without complication. With that being said the Wound VAC he states does have to be replaced almost every day or at least reinforced unfortunately. Patient actually has his CT scan later this morning we should have the results by tomorrow. 07/05/18 on evaluation today patient presents for follow-up concerning his right Ischial ulcer. He did see the surgeon Dr. Lysle Pearl last week. They were actually very happy with him and felt like he spent a tremendous amount of time with them as far as discussing his situation was concerned. In the end Dr. Lysle Pearl did contact me as well and determine that he would not recommend any surgical intervention at this point as he felt like it would not be in the patient's best interest based on what he was seeing. He recommended a referral to infectious disease. Subsequently this is something that Dr. Ines Bloomer office is working on setting up for the patient. As far as evaluation today is concerned the patient's wound actually appears to be worse at this point. I am concerned about how things are progressing and specifically about infection. I do not feel like it's the deeper but the area of depth is definitely widening which does have me concerned. No fevers, chills, nausea, or vomiting noted at this time. I think that we do need initiate antibiotic therapy the patient has an allow allergy to amoxicillin/penicillin he states that he gets a rash since childhood. Nonetheless she's never had the issues with Catholics or cephalosporins in general but  he is aware of. 07/27/18 on evaluation today patient presents following admission to the hospital on 07/09/18. He was subsequently discharged on 07/20/18. On 07/15/18 the patient underwent irrigation and debridement was soft tissue biopsy and bone biopsy as well as placement of a Wound VAC in the OR by Dr. Celine Ahr. During the hospital course the patient was placed on a Wound VAC and recommended follow up with surgery in three weeks actually with Dr. Delaine Lame who is infectious disease. The patient was on vancomycin during the hospital course. 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 He also had a bone culture which revealed evidence of methicillin-resistant staph aureus. He is updated CT scan 07/09/18 reveals that he had progression of the which was performed on wound to breakdown down to the trochanter where he actually had irregularities there as well suggestive of osteomyelitis. This was a change just since 9 December when we last performed a CT scan. Obviously this one had gone downhill quite significantly and rapidly. At this point upon evaluation I feel like in general the patient's wound seems to be doing fairly well all things considered upon my evaluation today. Obviously this is larger and deeper than what I previously evaluated but at the same time he seems to be making some progress as far as the appearance of the granulation tissue is concerned. I'm happy in that regard. No fevers, chills, nausea, or vomiting noted at this time. He is on  IV vancomycin and Rocephin at the facility. He is currently in [facility]. 08/03/18 upon evaluation today patient's wound appears to be doing better in regard to the overall appearance at this point in time. Fortunately he's been tolerating the Wound VAC without complication and states that the facility has been taking excellent care of the wound site. Overall I see some Slough noted on the surface which I am  going to attempt sharp debridement today of but nonetheless other than this I feel like he's making progress. 08/09/18 on evaluation today patient's wound appears to be doing much better compared to even last week's evaluation. Do believe that the Wound VAC is been of great benefit for him. He has been tolerating the dressing changes that is the Wound [name], [name]. ([name]) VAC without any complication and he has excellent granulation noted currently. There is no need for sharp debridement at this point. 08/16/18 on evaluation today patient actually appears to be doing very well in regard to the wound in the right gluteal fold region. This is showing signs of progress and again appears to be very healthy which is excellent news. Fortunately there is no sign of active infection by way of odor or drainage at this point. Overall I'm very pleased with how things stand. He seems to be tolerating the Wound VAC without complication. 08/23/18 on evaluation today patient actually appears to be doing better in regard to his wound. He has been tolerating the Wound VAC without complication and in fact it has been collecting a significant amount of drainage which I think is good news especially considering how the wound appears. Fortunately there is no signs of infection at this time definitely nothing appears to be worse which is good news. He has not been started on the Bactrim and Flagyl that was recommended by Dr. Delaine Lame yet. I did actually contact her office this morning in order to check and see were things are that regard their gonna be calling me back. 08/30/18 on evaluation today patient actually appears to show signs of excellent improvement today compared to last evaluation. The undermining is getting much better the wound seems to be feeling quite nicely and I'm very pleased that the granulation in general. With that being said overall I feel like the patient has made excellent progress  which is great news. No fevers, chills, nausea, or vomiting noted at this time. 09/06/18 on evaluation today patient actually appears to be doing rather well in regard to his right gluteal ulcer. This is showing signs of improvement in overall I'm very pleased with how things seem to be progressing. The patient likewise is please. Overall I see no evidence of infection he is about to complete his oral antibiotic regimen which is the end of the antibiotics for him in just about three days. 09/13/18 on evaluation today patient's right Ischial ulcer appears to be showing signs of continued improvement which is excellent news. He's been tolerating the dressing changes without complication. Fortunately there's no signs of infection and the wound that seems to be doing very well. 09/28/18 on evaluation today patient appears to be doing rather well in regard to his right Ischial ulcer. He's been tolerating the Wound VAC without complication he knows there's much less drainage than there used to be this obviously is not a bad thing in my pinion. There's no evidence of infection despite the fact is but nothing about it now for several weeks. 10/04/18 on evaluation today patient appears to be  doing better in regard to his right Ischial wound. He has been tolerating the Wound VAC without complication and I do believe that the silver nitrate last week was beneficial for him. Fortunately overall there's no evidence of active infection at this time which is great news. No fevers, chills, nausea, or vomiting noted at this time. 10/11/18 on evaluation today patient actually appears to be doing rather well in regard to his Ischial ulcer. He's been tolerating the Wound VAC still without complication I feel like this is doing a good job. No fevers, chills, nausea, or vomiting noted at this time. 11/01/18 on evaluation today patient presents after having not been seen in our clinic for several weeks secondary to the  fact that he was on evaluation today patient presents after having not been seen in our clinic for several weeks secondary to the fact that he was in a skilled nursing facility which was on lockdown currently due to the covert 19 national emergency. Subsequently he was discharged from the facility on this past Friday and subsequently made an appointment to come in to see yesterday. Fortunately there's no signs of active infection at this time which is good news and overall he does seem to have made progress since I last saw. Overall I feel like things are progressing quite nicely. The patient is having no pain. 11/08/18 on evaluation today patient appears to be doing okay in regard to his right gluteal ulcer. He has been utilizing a Wound VAC home health this changing this at this point since he's home from the skilled nursing facility. Fortunately there's no signs of obvious active infection at this time. Unfortunately though there's no obvious active infection he is having some maceration and his wife states that when the sheets of the Wound VAC office on Sunday when it broke seal that he ended up having significant issues with some smell as well there concerned about the possibility of infection. Fortunately there's No fevers, chills, nausea, or vomiting noted at this time. 11/15/18 on evaluation today patient actually appears to be doing well in regard to his right gluteal ulcer. He has been tolerating the dressing changes without complication. Specifically the Wound VAC has been utilized up to this point. Fortunately there's no signs of infection and overall I feel like he has made progress even since last week when I last saw him. I'm actually fairly happy with the overall appearance although he does seem to have somewhat of a hyper granular Fritchman, Anand J. ([id number]) overgrowth in the central portion of the wound which I think may require some sharp debridement to try flatness out  possibly utilizing chemical cauterization following. 11/23/18 on evaluation today patient actually appears to be doing very well in regard to his sacral ulcer. He seems to be showing signs of improvement with good granulation. With that being said he still has the small area of hyper granulation right in the central portion of the wound which I'm gonna likely utilize silver nitrate on today. Subsequently he also keeps having a leak at the 6 o'clock location which is unfortunate we may be able to help out with some suggestions to try to prevent this going forward. Fortunately there's no signs of active infection at this time. 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 He's been tolerating the dressing changes without complication. Fortunately there's no signs of active infection at this time. I've  been rather pleased with how things have progressed there still some evidence of pressure getting to the area with some redness right around the immediate wound opening. Nonetheless other than this I'm not seeing any significant complications or issues the wound is somewhat hyper granular. Upon discussing with the patient and his wife today I'm not sure that the wound is being packed to the base with the foam at this point. And if it's not been packed fully that may be part of the reason why is not seen as much improvement as far as the granulation from the base out. Again we do not want pack too tightly but we need some of the firm to get to the base of the wound. I discussed this with patient and his wife today. 12/06/18 on evaluation today patient appears to be doing well in regard to his right gluteal pressure ulcer. He's been tolerating the dressing changes without complication. Fortunately there's no signs of active infection. He still has some hyper granular tissue and I do think it would be appropriate to  continue with the chemical cauterization as of today. 12/16/18 on evaluation today patient actually appears to be doing okay in regard to his right gluteal ulcer. He is been tolerating the dressing changes without complication including the Wound VAC. Overall I feel like nothing seems to be worsening I do feel like that the hyper granulation buds in the central portion of the wound have improved to some degree with the silver nitrate. We will have to see how things continue to progress. 12/20/18 on evaluation today patient actually appears to be doing much worse in my pinion even compared to last week's evaluation. Unfortunately as opposed to showing any signs of improvement the areas of hyper granular tissue in the central portion of the wound seem to be getting worse. Subsequently the wound bed itself also seems to be getting deeper even compared to last week which is both unusual as well as concerning since prior he had been shown signs of improvement. Nonetheless I think that the issue could be that he's actually having some difficulty in issues with a deeper infection. There's no external signs of infection but nonetheless I am more worried about the internal, osteomyelitis, that could be restarting. He has not been on antibiotics for some time at this point. I think that it may be a good idea to go ahead and started back on an antibiotic therapy while we wait to see what the testing shows. 12/27/18 on evaluation today patient presents for follow-up concerning his left gluteal fold wound. Fortunately he appears to be doing well today. I did review the CT scan which was negative for any signs of osteomyelitis or acute abnormality this is excellent news. Overall I feel like the surface of the wound bed appears to be doing significantly better today compared to previously noted findings. There does not appear any signs of infection nor does he have any pain at this time. 01/03/19 on evaluation today  patient actually appears to be doing quite well in regard to his ulcer. Post debridement last week he really did not have too much bleeding which is good news. Fortunately today this seems to be doing some better but we still has some of the hyper granular tissue noted in the base of the wound which is gonna require sharp debridement today as well. Overall I'm pleased with how things seem to be progressing since we switched away from the Wound VAC I think he  is making some progress. 01/10/19 on evaluation today patient appears to be doing better in regard to his right gluteal fold ulcer. He has been tolerating the dressing changes without complication. The debridement to seem to be helping with current away some of the poor hyper granular tissue bugs throughout the region of his gluteal fold wound. He's been tolerating the dressing changes otherwise without complication which is great news. No fevers, chills, nausea, or vomiting noted at this time. 01/17/19 on evaluation today patient actually appears to be doing excellent in regard to his wound. He's been tolerating the dressing changes without complication. Fortunately there is no signs of active infection at this time which is great news. No fevers, chills, nausea, or vomiting noted at this time. 01/24/19 on evaluation today patient actually appears to be doing quite well with regard to his ulcer. He has been tolerating the dressing changes without complication. Fortunately there's no signs of active infection at this time. Overall been very pleased with the progress that he seems to be making currently. 01/31/19-Patient returns at 1 week with apparent similarity in dimensions to the wound, with no signs of infection, he has been KIELAN, FISK. ([name]) changing dressings twice a day 02/08/19 upon evaluation today patient actually appears to be doing well with regard to his right Ischial ulcer. The wound is not appear to be quite as deep and  seems to be making progress which is good news. With that being said I'm still reluctant to go back to the Wound VAC at this point. He's been having to change the dressings twice a day which is a little bit much in my pinion from the wound care supplies standpoint. I think that possibly attempting to utilize extras orbit may be beneficial this may also help to prevent any additional breakdown secondary to fluid retention in the wound itself. The patient is in agreement with giving this a try. 02/15/19 on evaluation today patient actually appears to be doing decently well with regard to his ulcer in the right to gluteal fold location. He's been tolerating the dressing changes without complication. Fortunately there is no signs of active infection at this time. He is able to keep the current dressing in place more effectively for a day at a time whereas before he was having a changes to to three times a day. The actions or has been helpful in this regard. Fortunately there's no signs of anything getting worse and I do feel like he showing signs of good improvement with regard to the wound bed status. 02/22/2019 patient appears to be doing very well today with regard to his ulcer in the gluteal fold. Fortunately there is no signs of active infection and he has been tolerating the dressing changes without any complication. Overall extremely pleased with how things seem to be progressing. He has much less of the hyper granular projections within the wound these have slowly been debrided away and he seems to be doing well. The wound bed is more uniform. 03/01/19 on evaluation today patient appears to be doing unfortunately about the same in regard to his gluteal ulcer. He's been tolerating the dressing changes without complication. Fortunately there's no signs of active infection at this time. With that being said he continues to develop these hyper granular projections which I'm unsure of exactly what they  are and why they are rising. Nonetheless I explained to the patient that I do believe it would be a good idea for [place] to stand a  biopsy sample for pathology to see if that can shed any light on what exactly may be going on here. Fortunately I do not see any obvious signs of infection. With that being said the patient has had a little bit more drainage this week apparently compared to last week. 03/08/2019 on evaluation today patient actually appears to look somewhat better with regard to the appearance of his wound bed at this time. This is good news. Overall I am very pleased with how things seem to have progressed just in the past week with a switch to the [facility] dressing. I think that has been beneficial for him. With that being said at this time the patient is concerned about his biopsy that I sent off last week unfortunately I do not have that report as of yet. Nonetheless we have called to obtain this and hopefully will hear back from the lab later this morning. 03/15/19 on evaluation today patient's wound actually appears to be doing okay today with regard to the overall appearance of the wound bed. He has been tolerating the dressing changes without complication he still has hyper granular tissue noted but fortunately that seems to be minimal at this point compared to some of what we've seen in the past. Nonetheless I do think that he is still having some issues currently with some of this type of granulation the biopsy and since all showed nothing more than just evidence of granulation tissue. Therefore there really is nothing different to initiate or do at this point. 03/24/19 on evaluation today patient appears to be doing a little better with regard to his ulcer. He's been tolerating the dressing changes without complication. Fortunately there is no signs of active infection at this time. No fevers, chills, nausea, or vomiting noted at this time. I'm overall pleased with how things  seem to be progressing. Electronic Signature(s) Signed: 03/25/2019 7:48:01 PM By: Worthy Keeler [name] Entered By: Worthy Keeler on 03/24/2019 09:06:50 PHINN, STAUFF ([id number]) -------------------------------------------------------------------------------- Physical Exam Details Patient Name: [name] Date of Service: 03/24/2019 8:00 AM Medical Record Number: [id number] Patient Account Number: [id number] Date of Birth/Sex: [birth year] (68 y.o. Male) Treating RN: Army Melia Primary Care Provider: [name] Other Clinician: Referring Provider: [name] Treating Provider/Extender: [name], HOYT Weeks in Treatment: 76 Constitutional Well-nourished and well-hydrated in no acute distress. Respiratory normal breathing without difficulty. Psychiatric this patient is able to make decisions and demonstrates good insight into disease process. Alert and Oriented x 3. pleasant and cooperative. Notes Patient's wound bed currently showed signs of good granulation there was a couple small hyper granular buds which actually did perform sharp debridement on today but overall I feel like he is doing quite well silver nitrate was utilized post debridement to chemically cauterize some of the hyper granular area. I do believe the [facility] Blue dressing is doing well. Electronic Signature(s) Signed: 03/24/2019 6:01:41 PM By: Worthy Keeler [name] Entered By: Worthy Keeler on 03/24/2019 18:01:41 [name], [name] ([id number]) -------------------------------------------------------------------------------- Physician Orders Details Patient Name: [name] Date of Service: 03/24/2019 8:00 AM Medical Record Number: [id number] Patient Account Number: [id number] Date of Birth/Sex: 12-16-1950 (68 y.o. 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 Skin Barriers/Peri-Wound Care Wound #1 Right Gluteal fold o Other: - Apply zinc oxide to peri-wound area. Primary Wound Dressing Wound #1 Right Gluteal fold o Hydrafera Blue Ready Transfer Secondary Dressing Wound #1 Right Gluteal fold o XtraSorb - Secure with Tegaderm Dressing Change Frequency Wound #1 Right Gluteal fold o Change dressing every day. Follow-up Appointments Wound #1 Right Gluteal fold o Return Appointment in 1 week. Off-Loading Wound #1 Right Gluteal fold o Turn and reposition every 2 hours [name], [name] ([id number]) Electronic Signature(s) Signed: 03/24/2019 12:59:19 PM By: Army Melia Signed: 03/25/2019 7:48:01 PM By: Worthy Keeler [name] Entered By: Army Melia on 03/24/2019 08:35:04 [name], [name] ([id number]) -------------------------------------------------------------------------------- Problem List Details Patient Name: [name] Date of Service: 03/24/2019 8:00 AM Medical Record Number: [id number] Patient Account Number: [id number] Date of Birth/Sex: [birth year] (68 y.o. Male) Treating RN: Army Melia Primary Care Provider: [name] Other Clinician: Referring Provider: [name] Treating Provider/Extender: [name], HOYT Weeks in Treatment: 79 Active Problems ICD-10 Evaluated Encounter Code Description Active Date Today  Diagnosis L89.314 Pressure ulcer of right buttock, stage 4 11/30/2017 No Yes L03.317 Cellulitis of buttock 06/21/2018 No Yes G82.20 Paraplegia, unspecified 11/30/2017 No Yes S34.109S Unspecified injury to unspecified level of lumbar spinal cord, 11/30/2017 No Yes sequela I10 Essential (primary) hypertension 11/30/2017 No Yes Inactive Problems Resolved Problems Electronic Signature(s) Signed: 03/25/2019 7:48:01 PM By: Worthy Keeler [name] Entered By: Worthy Keeler on 03/24/2019 08:24:48 [name] ([id number]) -------------------------------------------------------------------------------- Progress Note Details Patient Name: [name] Date of Service: 03/24/2019 8:00 AM Medical Record Number: [id number] Patient Account Number: [id number] Date of Birth/Sex: 07/21/1951 (68 y.o. 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 He states that there is no history of diabetes that he is aware of. He does have issues with his prostate and is currently receiving treatment for this by way of oral medication. With that being said I do not have a lot of details in that regard. Nonetheless the patient presents today as a result of having been referred to [place] by another provider initially home health was set to come out and take care of his wound although due to the fact that he apparently drives he's not able to receive home health. His wife is therefore trying to help  take care of this wound within although they have been struggling with what exactly to do at this point. She states that she can do some things but she is definitely not a nurse and does have some issues with looking at blood. The good news is the wound does not appear to be too deep and is fairly superficial at this point. There is no slough noted there is some nonviable skin noted around the  surface of the wound and the perimeter at this point. The central portion of the wound appears to be very good with a dermal layer noted this does not appear to be again deep enough to extend it to subcutaneous tissue at this point. 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 In the pump he has Dilaudid and baclofen. 12/07/17 on evaluation today patient presents for follow-up concerning his ongoing lower back thigh ulcer on the right. He states that he did not get the supplies ordered and therefore has not really been able to perform the dressing changes as directed exactly. His wife was able to get some Boarder Foam Dressing's from the drugstore and subsequently has been using hydrogel which did help to a degree in the wound does appear to be able smaller. There is actually more drainage this week noted than previous. 12/21/17 on evaluation today patient appears to be doing rather well in regard to his right gluteal ulcer. He has been tolerating the dressing changes without complication. There does not appear to be any evidence of infection at this point in time. Overall the wound does seem to be making some progress as far as the edges are concerned there's not as much in the way of overlapping of the external wound edges and he has a good epithelium to wound bed border for the most part. This however is not true right at the 12 o'clock location over the span of a little over a centimeters which actually will  require debridement today to clean this away and hopefully allow it to continue to heal more appropriately. 12/28/17 on evaluation today patient appears to be doing rather well in regard to his ulcer in the left gluteal region. He's been tolerating the dressing changes without complication. Apparently he has had some difficulty getting his dressing material. Apparently there's been some confusion with ordering we're gonna check into this. Nonetheless overall he's been showing signs of improvement which is good news. Debridement is not required today. 01/04/18 on evaluation today patient presents for follow-up concerning his right gluteal ulcer. He has been tolerating the dressing changes fairly well. On inspection today it appears he may actually have some maceration them concerned about the fact that he may be developing too much moisture in and around the wound bed which can cause delay in healing. With that being said he unfortunately really has not showed significant signs of improvement since last week's evaluation in fact this may even be just the little bit/slightly larger. Nonetheless he's been having a lot of discomfort I'm not sure this is even related to the wound as he has no pain when I'm to breeding or otherwise cleaning the wound during evaluation today. Nonetheless this is something that we did recommend he talked to his pain specialist concerning. 01/11/18 on evaluation today patient appears to be doing better in regard to his ulceration. He has been tolerating the dressing Hane, Makih J. ([id number]) changes without complication. With that being said overall there's no evidence of infection which is good news. The only thing is he did receive the hatch affair blue classic versus the ready nonetheless I feel like this is perfectly fine and appears to have done well for him over the past week. 01/25/18 on evaluation today patient's wound actually appears to be a little bit larger  than during the last evaluation. The good news is the  majority of the wound edges actually appear to be fairly firmly attached to the wound bed unfortunately again we're not really making progress in regard to the size. Roughly the wound is about the same size as when I first saw him although again the wound margin/edges appear to be much better. 02/01/18 on evaluation today patient actually appears to be doing very well in regard to his wound. Applying the Prisma dry does seem to be better although he does still have issues with slow progression of the wound. There was a slight improvement compared to last week's measurements today. Nonetheless I have been considering other options as far as the possibility of Theraskin or even a snap vac. In general I'm not sure that the Theraskin due to location of the wound would be a very good idea. Nonetheless I do think that a snap vac could be a possibility for the patient and in fact I think this could even be an excellent way to manage the wound possibly seeing some improvement in a very rapid fashion here. Nonetheless this is something that we would need to get approved and I did have a lengthy conversation with the patient about this today. 02/08/18 on evaluation today patient appears to be doing a little better in regard to his ulcer. He has been tolerating the dressing changes without complication. Fortunately despite the fact that the wound is a little bit smaller it's not significantly so unfortunately. We have discussed the possibility of a snap vac we did check with insurance this is actually covered at this point. Fortunately there does not appear to be any sign of infection. Overall I'm fairly pleased with how things seem to be appearing at this point. 02/15/18 on evaluation today patient appears to be doing rather well in regard to his right gluteal ulcer. Unfortunately the snap vac did not stay in place with his sheer and friction this came loose  and did not seem to maintain seal very well. He worked for about two days and it did seem to do very well during that time according to his wife but in general this does not seem to be something that's gonna be beneficial for him long-term. I do believe we need to go back to standard dressings to see if we can find something that will be of benefit. 03/02/18- He is here in follow up evaluation; there is minimal change in the wound. He will continue with the same treatment plan, would consider changing to iodosrob/iodoflex if ulcer continues to to plateau. He will follow up next week 03/08/18 on evaluation today patient's wound actually appears to be about the same size as when I previously saw him several weeks back. Unfortunately he does have some slightly dark discoloration in the central portion of the wound which has me concerned about pressure injury. I do believe he may be sitting for too long a period of time in fact he tells me that "I probably sit for much too long". He does have some Slough noted on the surface of the wound and again as far as the size of the wound is concerned I'm really not seeing anything that seems to have improved significantly. 03/15/18 on evaluation today patient appears to be doing fairly well in regard to his ulcer. The wound measured pretty much about the same today compared to last week's evaluation when looking at his graph. With that being said the area of bruising/deep tissue injury that was noted last week I do not  see at this point. He did get a new cushion fortunately this does seem to be have been of benefit in my pinion. It does appear that he's been off of this more which is good news as well I think that is definitely showing in the overall wound measurements. With that being said I do believe that he needs to continue to offload I don't think that the fact this is doing better should be or is going to allow him to not have to offload and explain this  to him as well. Overall he seems to be in agreement the plan I think he understands. The overall appearance of the wound bed is improved compared to last week I think the Iodoflex has been beneficial in that regard. 03/29/18 on evaluation today patient actually appears to be doing rather well in regard to his wound from the overall appearance standpoint he does have some granulation although there's some Slough on the surface of the wound noted as well. With that being said he unfortunately has not improved in regard to the overall measurement of the wound in volume or in size. I did have a discussion with him very specifically about offloading today. He actually does work although he mainly is just sitting throughout the day. He tells me he offloads by "lifting himself up for 30 seconds off of his chair occasionally" purchase from [employer] which does seem to have helped. And he has a new cushion that he with that being said he's also able to stand some for a very short period of time but not significant enough I think to provide appropriate offloading. I think the biggest issue at this point with the wound and the fact is not healing as quickly as we would like is due to the fact that he is really not able to appropriately offload while at work. He states the beginning after his injury he actually had a bed at his job that he could lay on in order to offload and that does seem to have been of help back at that time. Nonetheless he had not done this in quite some time unfortunately. I think that could be helpful for him this is something I would like for him to look into. BRYER, ROLLI ([name]) 04/05/18 on evaluation today patient actually presents for follow-up concerning his right gluteal ulcer. Again he really is not significantly improved even compared to last week. He has been tolerating the dressing changes without complication. With that being said fortunately there appears  to be no evidence of infection at this time. He has been more proactive in trying to offload. 04/12/18 on evaluation today patient actually appears to be doing a little better in regard to his wound and the right gluteal fold region. He's been tolerating the dressing changes since removing the oasis without complication. However he was having a lot of burning initially with the oasis in place. He's unsure of exactly why this was given so much discomfort but he assumes that it was the oasis itself causing the problem. Nonetheless this had to be removed after about three days in place although even those three days seem to have made a fairly good improvement in regard to the overall appearance of the wound bed. In fact is the first time that he's made any improvement from the standpoint of measurements in about six weeks. He continues to have no discomfort over the area of the wound itself which leads me to wonder why  he was having the burning with the oasis when he does not even feel the actual debridement's themselves. I am somewhat perplexed by this. 04/19/18 on evaluation today patient's wound actually appears to be showing signs of epithelialization around the edge of the wound and in general actually appears to be doing better which is good news. He did have the same burning after about three days with applying the Endoform last week in the same fashion that I would generally apply a skin substitute. This seems to indicate that it's not the oasis to cause the problem but potentially the moisture buildup that just causes things to burn or there may be some other reaction with the skin prep or Steri-Strips. Nonetheless I'm not sure that is gonna be able to tolerate any skin substitute for a long period of time. The good news is the wound actually appears to be doing better today compared to last week and does seem to finally be making some progress. 04/26/18 on evaluation today patient actually  appears to be doing rather well in regard to his ulcer in the right gluteal fold. He has been tolerating the dressing changes without complication which is good news. The Endoform does seem to be helping although he was a little bit more macerated this week. This seems to be an ongoing issue with fluid control at this point. Nonetheless I think we may be able to add something like Drawtex to help control the drainage. 05/03/18 on evaluation today patient appears to actually be doing better in regard to the overall appearance of his wound. He has been tolerating the dressing changes without complication. Fortunately there appears to be no evidence of infection at this time. I really feel like his wound has shown signs as of today of turning around last week I thought so as well and definitely he could be seen in this week's overall appearance and measurements. In general I'm very pleased with the fact that he finally seems to be making a steady but sure progress. The patient likewise is very pleased. 05/17/18 on evaluation today patient appears to be doing more poorly unfortunately in regard to his ulcer. He has been tolerating the dressing changes without complication. With that being said he tells me that in the past couple of days he and his wife have noticed that we did not seem to be doing quite as well is getting dark near the center. Subsequently upon evaluation today the wound actually does appear to be doing worse compared to previous. He has been tolerating the dressing changes otherwise and he states that he is not been sitting up anymore than he was in the past from what he tells me. Still he has continued to work he states "I'm tired of dealing with this and if I have to just go home and lay in the bed all the time that's what I'll do". Nonetheless I am concerned about the fact that this wound does appear to be deeper than what it was previous. 05/24/18 upon evaluation today patient  actually presents after having been in the hospital due to what was presumed to be sepsis secondary to the wound infection. He had an elevated white blood cell count between 14 and 15. With that being said he does seem to be doing somewhat better now. His wound still is giving him some trouble nonetheless and he is obviously concerned about the fact likely talked about that this does seem to go more deeply than previously noted. I  did review his wound culture which showed evidence of Staphylococcus aureus him and group B strep. Nonetheless he is on antibiotics, Levaquin, for this. Subsequently I did review his intake summary from the hospital as well. I also did look at the CT of the lumbar spine with contrast that was performed which showed no bone destruction to suggest lumbar disguises/osteomyelitis or sacral osteomyelitis. There was no paraspinal abscess. Nonetheless it appears this may have been more of just a soft tissue infection at this point which is good news. He still is nonetheless concerned about the wound which again I think is completely reasonable considering everything he's been through recently. 05/31/18 on evaluation today on evaluation today patient actually appears to be showing signs of his wound be a little bit deeper than what I would like to see. Fortunately he does not show any signs of significant infection although his temperature was 99 today he states he's been checking this at home and has not been elevated. Nonetheless with the undermining that I'm seeing at this point I am becoming more concerned about the wound I do think that offloading is a key factor here that is preventing the speedy recovery at this point. There does not appear to be any evidence of again over infection noted. He's been using Santyl currently. ALLEC, CLAYDON ([name]) 06/07/18 the patient presents today for follow-up evaluation regarding the left ulcer in the gluteal region. He has  been tolerating the Wound VAC fairly well. He is obviously very frustrated with this he states that to mean is really getting in his way. There does not appear to be any evidence of infection at this time he does have a little bit of odor I do not necessarily associate this with infection just something that we sometimes notice with Wound VAC therapy. With that being said I can definitely catch a tone of discontentment overall in the patient's demeanor today. This when he was previously in the hospital an CT scan was done of the lumbar region which did not reveal any signs of osteomyelitis. With that being said the pelvis in particular was not evaluated distinctly which means he could still have some osteonecrosis I. Nonetheless the Wound VAC was started on Thursday I do want to get this little bit more time before jumping to a CT scan of the pelvis although that is something that I might would recommend if were not see an improvement by that time. 06/14/18 on evaluation today patient actually appears to be doing about the same in regard to his right gluteal ulcer. Again he did have a CT scan of the lumbar spine unfortunately this did not include the pelvis. Nonetheless with the depth of the wound that I'm seeing today even despite the fact that I'm not seeing any evidence of overt cellulitis I believe there's a good chance that we may be dealing with osteomyelitis somewhere in the right Ischial region. No fevers, chills, nausea, or vomiting noted at this time. 06/21/18 on evaluation today patient actually appears to be doing about the same with regard to his wound. The tunnel at 6 o'clock really does not appear to be any deeper although it is a little bit wider. I think at this point you may want to start packing this with white phone. Unfortunately I have not got approval for the CT scan of the pelvis as of yet due to the fact that Medicare apparently has been denied it due to the diagnosis codes  not being appropriate  according to Medicare for the test requested. With that being said the patient cannot have an MRI and therefore this is the only option that we have as far as testing is concerned. The patient has had infection and was on antibiotics and been added code for cellulitis of the bottom to see if this will be appropriate for getting the test approved. Nonetheless I'm concerned about the infection have been spread deeper into the Ischial region. 06/28/18 on evaluation today patient actually appears to be doing rather well all things considered in regard to the right gluteal ulcer. He has been tolerating the dressing changes without complication. With that being said the Wound VAC he states does have to be replaced almost every day or at least reinforced unfortunately. Patient actually has his CT scan later this morning we should have the results by tomorrow. 07/05/18 on evaluation today patient presents for follow-up concerning his right Ischial ulcer. He did see the surgeon Dr. Lysle Pearl last week. They were actually very happy with him and felt like he spent a tremendous amount of time with them as far as discussing his situation was concerned. In the end Dr. Lysle Pearl did contact me as well and determine that he would not recommend any surgical intervention at this point as he felt like it would not be in the patient's best interest based on what he was seeing. He recommended a referral to infectious disease. Subsequently this is something that Dr. Ines Bloomer office is working on setting up for the patient. As far as evaluation today is concerned the patient's wound actually appears to be worse at this point. I am concerned about how things are progressing and specifically about infection. I do not feel like it's the deeper but the area of depth is definitely widening which does have me concerned. No fevers, chills, nausea, or vomiting noted at this time. I think that we do need initiate  antibiotic therapy the patient has an allow allergy to amoxicillin/penicillin he states that he gets a rash since childhood. Nonetheless she's never had the issues with Catholics or cephalosporins in general but he is aware of. 07/27/18 on evaluation today patient presents following admission to the hospital on 07/09/18. He was subsequently discharged on 07/20/18. On 07/15/18 the patient underwent irrigation and debridement was soft tissue biopsy and bone biopsy as well as placement of a Wound VAC in the OR by Dr. Celine Ahr. During the hospital course the patient was placed on a Wound VAC and recommended follow up with surgery in three weeks actually with Dr. Delaine Lame who is infectious disease. The patient was on vancomycin during the hospital course. 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 He also had a bone culture which revealed evidence of methicillin-resistant staph aureus. He is updated CT scan 07/09/18 reveals that he had progression of the which was performed on wound to breakdown down to the trochanter where he actually had irregularities there as well suggestive of osteomyelitis. This was a change just since 9 December when we last performed a CT scan. Obviously this one had gone downhill quite significantly and rapidly. At this point upon evaluation I feel like in general the patient's wound seems to be doing fairly well all things considered upon my evaluation today. Obviously this is larger and deeper than what I previously evaluated but at the same time he seems to be making some progress as far as the appearance of the granulation tissue is concerned. I'm happy in that  regard. No fevers, chills, nausea, or vomiting noted at this time. He is on IV vancomycin and Rocephin at the facility. He is currently in [facility]. 08/03/18 upon evaluation today patient's wound appears to be doing better in regard to the overall appearance at this point  in time. Fortunately he's been tolerating the Wound VAC without complication and states that the facility has been taking ADRON, CURY. ([id number]) excellent care of the wound site. Overall I see some Slough noted on the surface which I am going to attempt sharp debridement today of but nonetheless other than this I feel like he's making progress. 08/09/18 on evaluation today patient's wound appears to be doing much better compared to even last week's evaluation. Do believe that the Wound VAC is been of great benefit for him. He has been tolerating the dressing changes that is the Wound VAC without any complication and he has excellent granulation noted currently. There is no need for sharp debridement at this point. 08/16/18 on evaluation today patient actually appears to be doing very well in regard to the wound in the right gluteal fold region. This is showing signs of progress and again appears to be very healthy which is excellent news. Fortunately there is no sign of active infection by way of odor or drainage at this point. Overall I'm very pleased with how things stand. He seems to be tolerating the Wound VAC without complication. 08/23/18 on evaluation today patient actually appears to be doing better in regard to his wound. He has been tolerating the Wound VAC without complication and in fact it has been collecting a significant amount of drainage which I think is good news especially considering how the wound appears. Fortunately there is no signs of infection at this time definitely nothing appears to be worse which is good news. He has not been started on the Bactrim and Flagyl that was recommended by Dr. Delaine Lame yet. I did actually contact her office this morning in order to check and see were things are that regard their gonna be calling me back. 08/30/18 on evaluation today patient actually appears to show signs of excellent improvement today compared to last evaluation. The  undermining is getting much better the wound seems to be feeling quite nicely and I'm very pleased that the granulation in general. With that being said overall I feel like the patient has made excellent progress which is great news. No fevers, chills, nausea, or vomiting noted at this time. 09/06/18 on evaluation today patient actually appears to be doing rather well in regard to his right gluteal ulcer. This is showing signs of improvement in overall I'm very pleased with how things seem to be progressing. The patient likewise is please. Overall I see no evidence of infection he is about to complete his oral antibiotic regimen which is the end of the antibiotics for him in just about three days. 09/13/18 on evaluation today patient's right Ischial ulcer appears to be showing signs of continued improvement which is excellent news. He's been tolerating the dressing changes without complication. Fortunately there's no signs of infection and the wound that seems to be doing very well. 09/28/18 on evaluation today patient appears to be doing rather well in regard to his right Ischial ulcer. He's been tolerating the Wound VAC without complication he knows there's much less drainage than there used to be this obviously is not a bad thing in my pinion. There's no evidence of infection despite the fact is but nothing  about it now for several weeks. 10/04/18 on evaluation today patient appears to be doing better in regard to his right Ischial wound. He has been tolerating the Wound VAC without complication and I do believe that the silver nitrate last week was beneficial for him. Fortunately overall there's no evidence of active infection at this time which is great news. No fevers, chills, nausea, or vomiting noted at this time. 10/11/18 on evaluation today patient actually appears to be doing rather well in regard to his Ischial ulcer. He's been tolerating the Wound VAC still without complication I feel like  this is doing a good job. No fevers, chills, nausea, or vomiting noted at this time. 11/01/18 on evaluation today patient presents after having not been seen in our clinic for several weeks secondary to the fact that he was on evaluation today patient presents after having not been seen in our clinic for several weeks secondary to the fact that he was in a skilled nursing facility which was on lockdown currently due to the [facility] national emergency. Subsequently he was discharged from the facility on this past Friday and subsequently made an appointment to come in to see yesterday. Fortunately there's no signs of active infection at this time which is good news and overall he does seem to have made progress since I last saw. Overall I feel like things are progressing quite nicely. The patient is having no pain. 11/08/18 on evaluation today patient appears to be doing okay in regard to his right gluteal ulcer. He has been utilizing a [facility] home health this changing this at this point since he's home from the skilled nursing facility. Fortunately there's no signs of obvious active infection at this time. Unfortunately though there's no obvious active infection he is having some maceration and his wife states that when the sheets of the [facility] office on Sunday when it broke seal that he ended up having significant issues with some smell as well there concerned about the possibility of infection. Fortunately there's No fevers, chills, nausea, or vomiting noted at this time. CLAYBOURNE, BORING ([id number]) 11/15/18 on evaluation today patient actually appears to be doing well in regard to his right gluteal ulcer. He has been tolerating the dressing changes without complication. Specifically the Wound VAC has been utilized up to this point. Fortunately there's no signs of infection and overall I feel like he has made progress even since last week when I last saw him. I'm actually fairly happy  with the overall appearance although he does seem to have somewhat of a hyper granular overgrowth in the central portion of the wound which I think may require some sharp debridement to try flatness out possibly utilizing chemical cauterization following. 11/23/18 on evaluation today patient actually appears to be doing very well in regard to his sacral ulcer. He seems to be showing signs of improvement with good granulation. With that being said he still has the small area of hyper granulation right in the central portion of the wound which I'm gonna likely utilize silver nitrate on today. Subsequently he also keeps having a leak at the 6 o'clock location which is unfortunate we may be able to help out with some suggestions to try to prevent this going forward. Fortunately there's no signs of active infection at this time. 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 He's been tolerating the dressing  changes without complication. Fortunately there's no signs of active infection at this time. I've been rather pleased with how things have progressed there still some evidence of pressure getting to the area with some redness right around the immediate wound opening. Nonetheless other than this I'm not seeing any significant complications or issues the wound is somewhat hyper granular. Upon discussing with the patient and his wife today I'm not sure that the wound is being packed to the base with the foam at this point. And if it's not been packed fully that may be part of the reason why is not seen as much improvement as far as the granulation from the base out. Again we do not want pack too tightly but we need some of the firm to get to the base of the wound. I discussed this with patient and his wife today. 12/06/18 on evaluation today patient appears to be doing well in regard to his right gluteal pressure  ulcer. He's been tolerating the dressing changes without complication. Fortunately there's no signs of active infection. He still has some hyper granular tissue and I do think it would be appropriate to continue with the chemical cauterization as of today. 12/16/18 on evaluation today patient actually appears to be doing okay in regard to his right gluteal ulcer. He is been tolerating the dressing changes without complication including the Wound VAC. Overall I feel like nothing seems to be worsening I do feel like that the hyper granulation buds in the central portion of the wound have improved to some degree with the silver nitrate. We will have to see how things continue to progress. 12/20/18 on evaluation today patient actually appears to be doing much worse in my pinion even compared to last week's evaluation. Unfortunately as opposed to showing any signs of improvement the areas of hyper granular tissue in the central portion of the wound seem to be getting worse. Subsequently the wound bed itself also seems to be getting deeper even compared to last week which is both unusual as well as concerning since prior he had been shown signs of improvement. Nonetheless I think that the issue could be that he's actually having some difficulty in issues with a deeper infection. There's no external signs of infection but nonetheless I am more worried about the internal, osteomyelitis, that could be restarting. He has not been on antibiotics for some time at this point. I think that it may be a good idea to go ahead and started back on an antibiotic therapy while we wait to see what the testing shows. 12/27/18 on evaluation today patient presents for follow-up concerning his left gluteal fold wound. Fortunately he appears to be doing well today. I did review the CT scan which was negative for any signs of osteomyelitis or acute abnormality this is excellent news. Overall I feel like the surface of the wound bed  appears to be doing significantly better today compared to previously noted findings. There does not appear any signs of infection nor does he have any pain at this time. 01/03/19 on evaluation today patient actually appears to be doing quite well in regard to his ulcer. Post debridement last week he really did not have too much bleeding which is good news. Fortunately today this seems to be doing some better but we still has some of the hyper granular tissue noted in the base of the wound which is gonna require sharp debridement today as well. Overall I'm pleased with how things seem  to be progressing since we switched away from the Wound VAC I think he is making some progress. 01/10/19 on evaluation today patient appears to be doing better in regard to his right gluteal fold ulcer. He has been tolerating the dressing changes without complication. The debridement to seem to be helping with current away some of the poor hyper granular tissue bugs throughout the region of his gluteal fold wound. He's been tolerating the dressing changes otherwise without complication which is great news. No fevers, chills, nausea, or vomiting noted at this time. 01/17/19 on evaluation today patient actually appears to be doing excellent in regard to his wound. He's been tolerating the dressing changes without complication. Fortunately there is no signs of active infection at this time which is great news. No fevers, chills, nausea, or vomiting noted at this time. [name], SNIDE ([id number]) 01/24/19 on evaluation today patient actually appears to be doing quite well with regard to his ulcer. He has been tolerating the dressing changes without complication. Fortunately there's no signs of active infection at this time. Overall been very pleased with the progress that he seems to be making currently. 01/31/19-Patient returns at 1 week with apparent similarity in dimensions to the wound, with no signs of infection, he  has been changing dressings twice a day 02/08/19 upon evaluation today patient actually appears to be doing well with regard to his right Ischial ulcer. The wound is not appear to be quite as deep and seems to be making progress which is good news. With that being said I'm still reluctant to go back to the Wound VAC at this point. He's been having to change the dressings twice a day which is a little bit much in my pinion from the wound care supplies standpoint. I think that possibly attempting to utilize extras orbit may be beneficial this may also help to prevent any additional breakdown secondary to fluid retention in the wound itself. The patient is in agreement with giving this a try. 02/15/19 on evaluation today patient actually appears to be doing decently well with regard to his ulcer in the right to gluteal fold location. He's been tolerating the dressing changes without complication. Fortunately there is no signs of active infection at this time. He is able to keep the current dressing in place more effectively for a day at a time whereas before he was having a changes to to three times a day. The actions or has been helpful in this regard. Fortunately there's no signs of anything getting worse and I do feel like he showing signs of good improvement with regard to the wound bed status. 02/22/2019 patient appears to be doing very well today with regard to his ulcer in the gluteal fold. Fortunately there is no signs of active infection and he has been tolerating the dressing changes without any complication. Overall extremely pleased with how things seem to be progressing. He has much less of the hyper granular projections within the wound these have slowly been debrided away and he seems to be doing well. The wound bed is more uniform. 03/01/19 on evaluation today patient appears to be doing unfortunately about the same in regard to his gluteal ulcer. He's been tolerating the dressing changes  without complication. Fortunately there's no signs of active infection at this time. With that being said he continues to develop these hyper granular projections which I'm unsure of exactly what they are and why they are rising. Nonetheless I explained to the patient that  I do believe it would be a good idea for [place] to stand a biopsy sample for pathology to see if that can shed any light on what exactly may be going on here. Fortunately I do not see any obvious signs of infection. With that being said the patient has had a little bit more drainage this week apparently compared to last week. 03/08/2019 on evaluation today patient actually appears to look somewhat better with regard to the appearance of his wound bed at this time. This is good news. Overall I am very pleased with how things seem to have progressed just in the past week with a switch to the [facility] dressing. I think that has been beneficial for him. With that being said at this time the patient is concerned about his biopsy that I sent off last week unfortunately I do not have that report as of yet. Nonetheless we have called to obtain this and hopefully will hear back from the lab later this morning. 03/15/19 on evaluation today patient's wound actually appears to be doing okay today with regard to the overall appearance of the wound bed. He has been tolerating the dressing changes without complication he still has hyper granular tissue noted but fortunately that seems to be minimal at this point compared to some of what we've seen in the past. Nonetheless I do think that he is still having some issues currently with some of this type of granulation the biopsy and since all showed nothing more than just evidence of granulation tissue. Therefore there really is nothing different to initiate or do at this point. 03/24/19 on evaluation today patient appears to be doing a little better with regard to his ulcer. He's been tolerating the  dressing changes without complication. Fortunately there is no signs of active infection at this time. No fevers, chills, nausea, or vomiting noted at this time. I'm overall pleased with how things seem to be progressing. Patient History Information obtained from Patient. Family History Hypertension - Father, Stroke - Mother, No family history of Cancer, Diabetes, Heart Disease, Kidney Disease, Lung Disease, Seizures, Thyroid Problems, Tuberculosis. Social History Never smoker, Marital Status - Married, Alcohol Use - Never, Drug Use - No History, Caffeine Use - Daily. CLAVIN, JOHNSEY ([id number]) Medical History Eyes Patient has history of Cataracts - both removed Denies history of Glaucoma, Optic Neuritis Ear/Nose/Mouth/Throat Denies history of Chronic sinus problems/congestion, Middle ear problems Hematologic/Lymphatic Denies history of Anemia, Hemophilia, Human Immunodeficiency Virus, Lymphedema Respiratory Denies history of Aspiration, Asthma, Chronic Obstructive Pulmonary Disease (COPD), Pneumothorax, Sleep Apnea, Tuberculosis Cardiovascular Patient has history of Hypertension - takes medication Denies history of Angina, Arrhythmia, Congestive Heart Failure, Coronary Artery Disease, Deep Vein Thrombosis, Hypotension, Myocardial Infarction, Peripheral Arterial Disease, Peripheral Venous Disease, Phlebitis, Vasculitis Gastrointestinal Denies history of Cirrhosis , Colitis, Crohn s, Hepatitis A, Hepatitis B, Hepatitis C Endocrine Denies history of Type I Diabetes, Type II Diabetes Genitourinary Denies history of End Stage Renal Disease Immunological Denies history of Lupus Erythematosus, Raynaud s, Scleroderma Integumentary (Skin) Denies history of History of Burn, History of pressure wounds Musculoskeletal Denies history of Gout, Rheumatoid Arthritis, Osteoarthritis, Osteomyelitis Neurologic Patient has history of Paraplegia - waist down Denies history of Dementia,  Neuropathy, Quadriplegia, Seizure Disorder Oncologic Denies history of Received Chemotherapy, Received Radiation Psychiatric Denies history of Anorexia/bulimia, Confinement Anxiety Medical And Surgical History Notes Oncologic Prostate cancer- currently treated with horomone therapy Review of Systems (ROS) Constitutional Symptoms (General Health) Denies complaints or symptoms of Fatigue, Fever, Chills,  Marked Weight Change. Respiratory Denies complaints or symptoms of Chronic or frequent coughs, Shortness of Breath. Cardiovascular Denies complaints or symptoms of Chest pain, LE edema. Psychiatric Denies complaints or symptoms of Anxiety, Claustrophobia. Objective CHRISTOPHERJAME, DEJOIE. ([id number]) Constitutional Well-nourished and well-hydrated in no acute distress. Vitals Time Taken: 8:04 AM, Height: 73 in, Weight: 210 lbs, BMI: 27.7, Temperature: 98.8 F, Pulse: 72 bpm, Respiratory Rate: 16 breaths/min, Blood Pressure: 130/75 mmHg. Respiratory normal breathing without difficulty. Psychiatric this patient is able to make decisions and demonstrates good insight into disease process. Alert and Oriented x 3. pleasant and cooperative. General Notes: Patient's wound bed currently showed signs of good granulation there was a couple small hyper granular buds which actually did perform sharp debridement on today but overall I feel like he is doing quite well silver nitrate was utilized post debridement to chemically cauterize some of the hyper granular area. I do believe the [facility] Blue dressing is doing well. Integumentary (Hair, Skin) Wound #1 status is Open. Original cause of wound was Pressure Injury. The wound is located on the Right Gluteal fold. The wound measures 3.4cm length x 2.6cm width x 2.9cm depth; 6.943cm^2 area and 20.134cm^3 volume. There is muscle and Fat Layer (Subcutaneous Tissue) Exposed exposed. There is no tunneling or undermining noted. There is a large amount  of serosanguineous drainage noted. The wound margin is epibole. There is medium (34-66%) red, pink, hyper - granulation within the wound bed. There is a medium (34-66%) amount of necrotic tissue within the wound bed including Adherent Slough. 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 There was a Excisional Skin/Subcutaneous Tissue Debridement with a total area of 1 sq cm performed by [name], HOYT E., [name]. With the following instrument(s): Curette to remove Viable and Non-Viable tissue/material. Material removed includes Subcutaneous Tissue and Hyper-granulation and. A time out was conducted at 08:29, prior to the start of the procedure. A Moderate amount of bleeding was controlled with Silver Nitrate. The procedure was tolerated well. Post Debridement Measurements: 3.4cm length x 2.6cm width x 2.9cm depth; 20.134cm^3 volume. Post debridement Stage noted as Category/Stage IV. Character of Wound/Ulcer Post Debridement is stable. PRINCESTON, CHAPMON ([id number]) Post procedure Diagnosis Wound #1: Same as Pre-Procedure Plan Wound Cleansing: Wound #1 Right Gluteal fold: Other: - Cleanse wound with Dakin's Solution Anesthetic (add to Medication List): Wound #1 Right Gluteal fold: Topical Lidocaine 4% cream applied to wound bed prior to debridement (In Clinic Only). Skin Barriers/Peri-Wound Care: Wound #1 Right Gluteal fold: Other: - Apply zinc oxide to peri-wound area. Primary Wound Dressing: Wound #1 Right Gluteal fold: Hydrafera Blue Ready Transfer Secondary Dressing: Wound #1 Right Gluteal fold: XtraSorb - Secure with Tegaderm Dressing Change Frequency: Wound #1 Right Gluteal  fold: Change dressing every day. Follow-up Appointments: Wound #1 Right Gluteal fold: Return Appointment in 1 week. Off-Loading: Wound #1 Right Gluteal fold: Turn and reposition every 2 hours 1. I would recommend that we continue with the [facility] dressing as I feel like this is beneficial for the patient he is in agreement the plan. 2. I am good also suggest that we go ahead and continue with the repositioning which he is doing he is staying in bed a lot of the time to keep pressure off the  area. 3. I am also going to continue to have them cleanse with the Dakin's solution in between dressing changes I think this is keeping it nice and clean. We will see patient back for reevaluation in 1 week here in the clinic. If anything worsens or changes patient will contact our office for additional recommendations. Electronic Signature(s) Signed: 03/24/2019 6:02:17 PM By: Worthy Keeler [name] Entered By: Worthy Keeler on 03/24/2019 18:02:17 EDMAN, KAVA ([id number]) -------------------------------------------------------------------------------- ROS/PFSH Details Patient Name: [name] Date of Service: 03/24/2019 8:00 AM Medical Record Number: [id number] Patient Account Number: [id number] Date of Birth/Sex: 11/22/1950 (68 y.o. Male) Treating RN: Army Melia Primary Care Provider: [name] Other Clinician: Referring Provider: [name] Treating Provider/Extender: [name], HOYT Weeks in Treatment: 55 Information Obtained From Patient Constitutional Symptoms (General Health) Complaints and Symptoms: Negative for: Fatigue; Fever; Chills; Marked Weight Change Respiratory Complaints and Symptoms: Negative for: Chronic or frequent coughs; Shortness of Breath Medical History: Negative for: Aspiration; Asthma; Chronic Obstructive Pulmonary Disease (COPD); Pneumothorax; Sleep Apnea; Tuberculosis Cardiovascular Complaints and Symptoms: Negative for:  Chest pain; LE edema Medical History: Positive for: Hypertension - takes medication Negative for: Angina; Arrhythmia; Congestive Heart Failure; Coronary Artery Disease; Deep Vein Thrombosis; Hypotension; Myocardial Infarction; Peripheral Arterial Disease; Peripheral Venous Disease; Phlebitis; Vasculitis Psychiatric Complaints and Symptoms: Negative for: Anxiety; Claustrophobia Medical History: Negative for: Anorexia/bulimia; Confinement Anxiety Eyes Medical History: Positive for: Cataracts - both removed Negative for: Glaucoma; Optic Neuritis Ear/Nose/Mouth/Throat Medical History: Negative for: Chronic sinus problems/congestion; Middle ear problems Hematologic/Lymphatic Medical History: Negative for: Anemia; Hemophilia; Human Immunodeficiency Virus; Lymphedema RAIFORD, DANIELEWICZ. ([id number]) Gastrointestinal Medical History: Negative for: Cirrhosis ; Colitis; Crohnos; Hepatitis A; Hepatitis B; Hepatitis C Endocrine Medical History: Negative for: Type I Diabetes; Type II Diabetes Genitourinary Medical History: Negative for: End Stage Renal Disease Immunological Medical History: Negative for: Lupus Erythematosus; Raynaudos; Scleroderma Integumentary (Skin) Medical History: Negative for: History of Burn; History of pressure wounds Musculoskeletal Medical History: Negative for: Gout; Rheumatoid Arthritis; Osteoarthritis; Osteomyelitis Neurologic Medical History: Positive for: Paraplegia - waist down Negative for: Dementia; Neuropathy; Quadriplegia; Seizure Disorder Oncologic Medical History: Negative for: Received Chemotherapy; Received Radiation Past Medical History Notes: Prostate cancer- currently treated with horomone therapy HBO Extended History Items Eyes: Cataracts Immunizations Pneumococcal Vaccine: Received Pneumococcal Vaccination: No Implantable Devices No devices added Family and Social History Cancer: No; Diabetes: No; Heart Disease: No;  Hypertension: Yes - Father; Kidney Disease: No; Lung Disease: No; Seizures: No; Stroke: Yes - Mother; Thyroid Problems: No; Tuberculosis: No; Never smoker; Marital Status - Married; Alcohol Use: Never; Drug Use: No History; Caffeine Use: Daily SHAKEEM, SKAGGS ([id number]) Physician Affirmation I have reviewed and agree with the above information. Electronic Signature(s) Signed: 03/24/2019 12:59:19 PM By: Army Melia Signed: 03/25/2019 7:48:01 PM By: Worthy Keeler [name] Entered By: Worthy Keeler on 03/24/2019 09:07:20 DOMENIQUE, CUERVO ([id number]) -------------------------------------------------------------------------------- SuperBill Details Patient Name: [name] Date of Service: 03/24/2019 Medical Record Number: [id number] Patient Account Number: [id number] Date of Birth/Sex: 05-Dec-1950 (68 y.o. 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

## 2019-03-29 ENCOUNTER — Other Ambulatory Visit: Payer: Self-pay

## 2019-03-29 ENCOUNTER — Other Ambulatory Visit: Payer: Self-pay | Admitting: Physician Assistant

## 2019-03-29 ENCOUNTER — Encounter: Payer: Medicare Other | Admitting: Physician Assistant

## 2019-03-29 DIAGNOSIS — L89314 Pressure ulcer of right buttock, stage 4: Secondary | ICD-10-CM | POA: Diagnosis not present

## 2019-03-29 DIAGNOSIS — L03317 Cellulitis of buttock: Secondary | ICD-10-CM

## 2019-03-29 NOTE — Progress Notes (Signed)
[name], [name] ([id number]) Visit Report for 03/29/2019 Arrival Information Details Patient Name: [name], [name] Date of Service: 03/29/2019 8:00 AM Medical Record Number: [id number] Patient Account Number: [id number] Date of Birth/Sex: [birth year] (68 y.o. M) Treating RN: Army Melia Primary Care Brendin Situ: [name] Other Clinician: Referring October Peery: [name] Treating Kameryn Tisdel/Extender: [name], HOYT Weeks in Treatment: 30 Visit Information History Since Last Visit Added or deleted any medications: No Patient Arrived: Wheel Chair Any new allergies or adverse reactions: No Arrival Time: 08:10 Had a fall or experienced change in No Accompanied By: wife activities of daily living that may affect Transfer Assistance: None risk of falls: Patient Requires Transmission-Based No Signs or symptoms of abuse/neglect since last visito No Precautions: Hospitalized since last visit: No Patient Has Alerts: Yes Has Dressing in Place as Prescribed: Yes Patient Alerts: NOT Pain Present Now: No Diabetic Electronic Signature(s) Signed: 03/29/2019 11:37:35 AM By: Army Melia Entered By: Army Melia on 03/29/2019 08:11:01 [name] ([id number]) -------------------------------------------------------------------------------- Clinic Level of Care Assessment Details Patient Name: [name] Date of Service: 03/29/2019 8:00 AM Medical Record Number: [id number] Patient Account Number: [id number] Date of Birth/Sex: [birth year] (68 y.o. M) Treating RN: Montey Hora Primary Care Taryn Nave: [name] Other Clinician: Referring Jermiya Reichl: [name] Treating Kanetra Ho/Extender: [name], HOYT Weeks in Treatment: 33 Clinic Level of Care Assessment Items TOOL 4 Quantity Score []  - Use when only an EandM is performed on FOLLOW-UP visit 0 ASSESSMENTS - Nursing Assessment / Reassessment X - Reassessment of Co-morbidities (includes updates in patient  status) 1 10 X- 1 5 Reassessment of Adherence to Treatment Plan ASSESSMENTS - Wound and Skin Assessment / Reassessment X - Simple Wound Assessment / Reassessment - one wound 1 5 []  - 0 Complex Wound Assessment / Reassessment - multiple wounds []  - 0 Dermatologic / Skin Assessment (not related to wound area) ASSESSMENTS - Focused Assessment []  - Circumferential Edema Measurements - multi extremities 0 []  - 0 Nutritional Assessment / Counseling / Intervention []  - 0 Lower Extremity Assessment (monofilament, tuning fork, pulses) []  - 0 Peripheral Arterial Disease Assessment (using hand held doppler) ASSESSMENTS - Ostomy and/or Continence Assessment and Care []  - Incontinence Assessment and Management 0 []  - 0 Ostomy Care Assessment and Management (repouching, etc.) PROCESS - Coordination of Care X - Simple Patient / Family Education for ongoing care 1 15 []  - 0 Complex (extensive) Patient / Family Education for ongoing care X- 1 10 Staff obtains Programmer, systems, Records, Test Results / Process Orders []  - 0 Staff telephones HHA, Nursing Homes / Clarify orders / etc []  - 0 Routine Transfer to another Facility (non-emergent condition) []  - 0 Routine Hospital Admission (non-emergent condition) []  - 0 New Admissions / Biomedical engineer / Ordering NPWT, Apligraf, etc. []  - 0 Emergency Hospital Admission (emergent condition) X- 1 10 Simple Discharge Coordination [name], GAVINS. ([id number]) []  - 0 Complex (extensive) Discharge Coordination PROCESS - Special Needs []  - Pediatric / Minor Patient Management 0 []  - 0 Isolation Patient Management []  - 0 Hearing / Language / Visual special needs []  - 0 Assessment of Community assistance (transportation, D/C planning, etc.) []  - 0 Additional assistance / Altered mentation []  - 0 Support Surface(s) Assessment (bed, cushion, seat, etc.) INTERVENTIONS - Wound Cleansing / Measurement X - Simple Wound Cleansing - one wound 1  5 []  - 0 Complex Wound Cleansing - multiple wounds X- 1 5 Wound Imaging (photographs - any number of wounds) []  - 0 Wound Tracing (instead of photographs)  X- 1 5 Simple Wound Measurement - one wound []  - 0 Complex Wound Measurement - multiple wounds INTERVENTIONS - Wound Dressings X - Small Wound Dressing one or multiple wounds 1 10 []  - 0 Medium Wound Dressing one or multiple wounds []  - 0 Large Wound Dressing one or multiple wounds []  - 0 Application of Medications - topical []  - 0 Application of Medications - injection INTERVENTIONS - Miscellaneous []  - External ear exam 0 []  - 0 Specimen Collection (cultures, biopsies, blood, body fluids, etc.) []  - 0 Specimen(s) / Culture(s) sent or taken to Lab for analysis []  - 0 Patient Transfer (multiple staff / Civil Service fast streamer / Similar devices) []  - 0 Simple Staple / Suture removal (25 or less) []  - 0 Complex Staple / Suture removal (26 or more) []  - 0 Hypo / Hyperglycemic Management (close monitor of Blood Glucose) []  - 0 Ankle / Brachial Index (ABI) - do not check if billed separately X- 1 5 Vital Signs [name], [name] ([id number]) Has the patient been seen at the hospital within the last three years: Yes Total Score: 85 Level Of Care: New/Established - Level 3 Electronic Signature(s) Signed: 03/29/2019 4:33:06 PM By: Montey Hora Entered By: Montey Hora on 03/29/2019 08:38:00 [name] ([id number]) -------------------------------------------------------------------------------- Encounter Discharge Information Details Patient Name: [name] Date of Service: 03/29/2019 8:00 AM Medical Record Number: [id number] Patient Account Number: [id number] Date of Birth/Sex: [birth year] (68 y.o. M) Treating RN: Montey Hora Primary Care Ta Fair: [name] Other Clinician: Referring Lovell Roe: [name] Treating Samiyyah Moffa/Extender: [name], HOYT Weeks in Treatment: 75 Encounter Discharge  Information Items Discharge Condition: Stable Ambulatory Status: Wheelchair Discharge Destination: Home Transportation: Private Auto Accompanied By: wife Schedule Follow-up Appointment: Yes Clinical Summary of Care: Electronic Signature(s) Signed: 03/29/2019 4:33:06 PM By: Montey Hora Entered By: Montey Hora on 03/29/2019 08:40:44 [name] ([id number]) -------------------------------------------------------------------------------- Lower Extremity Assessment Details Patient Name: [name] Date of Service: 03/29/2019 8:00 AM Medical Record Number: [id number] Patient Account Number: [id number] Date of Birth/Sex: February 08, 1951 (68 y.o. M) Treating RN: Army Melia Primary Care Giselle Brutus: [name] Other Clinician: Referring Heena Woodbury: [name] Treating Jakaree Pickard/Extender: [name], HOYT Weeks in Treatment: 3 Electronic Signature(s) Signed: 03/29/2019 11:37:35 AM By: Army Melia Entered By: Army Melia on 03/29/2019 08:19:27 [name], [name] ([id number]) -------------------------------------------------------------------------------- Multi Wound Chart Details Patient Name: [name] Date of Service: 03/29/2019 8:00 AM Medical Record Number: [id number] Patient Account Number: [id number] Date of Birth/Sex: 20-Jun-1951 (68 y.o. M) Treating RN: Montey Hora Primary Care Milisa Kimbell: [name] Other Clinician: Referring Shaden Lacher: [name] Treating Aditya Nastasi/Extender: [name], HOYT Weeks in Treatment: 60 Vital Signs Height(in): 73 Pulse(bpm): 68 Weight(lbs): 210 Blood Pressure(mmHg): 125/65 Body Mass Index(BMI): 28 Temperature(F): 98.8 Respiratory Rate 16 (breaths/min): Photos: [N/A:N/A] Wound Location: Right Gluteal fold N/A N/A Wounding Event: Pressure Injury N/A N/A Primary Etiology: Pressure Ulcer N/A N/A Comorbid History: Cataracts, Hypertension, N/A N/A Paraplegia Date Acquired: 11/02/2017 N/A N/A Weeks of  Treatment: 69 N/A N/A Wound Status: Open N/A N/A Measurements L x W x D 3.5x2x3 N/A N/A (cm) Area (cm) : 5.498 N/A N/A Volume (cm) : 16.493 N/A N/A % Reduction in Area: 45.30% N/A N/A % Reduction in Volume: -1541.10% N/A N/A Position 1 (o'clock): 12 Maximum Distance 1 (cm): 4 Tunneling: Yes N/A N/A Classification: Category/Stage IV N/A N/A Exudate Amount: Large N/A N/A Exudate Type: Serosanguineous N/A N/A Exudate Color: red, brown N/A N/A Wound Margin: Epibole N/A N/A Granulation Amount: Medium (34-66%) N/A N/A Granulation Quality: Red,  Pink, Hyper-granulation N/A N/A Necrotic Amount: Medium (34-66%) N/A N/A Exposed Structures: Fat Layer (Subcutaneous N/A N/A Tissue) Exposed: Yes Muscle: Yes Fascia: No [name], KRAAI. ([id number]) Tendon: No Joint: No Bone: No Epithelialization: None N/A N/A Treatment Notes Electronic Signature(s) Signed: 03/29/2019 4:33:06 PM By: Montey Hora Entered By: Montey Hora on 03/29/2019 08:33:48 [name] ([id number]) -------------------------------------------------------------------------------- [place] Details Patient Name: [name] Date of Service: 03/29/2019 8:00 AM Medical Record Number: [id number] Patient Account Number: [id number] Date of Birth/Sex: 1950-10-08 (68 y.o. M) Treating RN: Montey Hora Primary Care Chibueze Beasley: [name] Other Clinician: Referring Nikoleta Dady: [name] Treating Lynda Capistran/Extender: [name], HOYT Weeks in Treatment: 13 Active Inactive Orientation to the Wound Care Program Nursing Diagnoses: Knowledge deficit related to the wound healing center program Goals: Patient/caregiver will verbalize understanding of the [facility] Program Date Initiated: 11/30/2017 Target Resolution Date: 12/21/2017 Goal Status: Active Interventions: Provide education on orientation to the wound center Notes: Pressure Nursing Diagnoses: Knowledge deficit  related to management of pressures ulcers Goals: Patient/caregiver will verbalize understanding of pressure ulcer management Date Initiated: 05/17/2018 Target Resolution Date: 05/28/2018 Goal Status: Active Interventions: Assess: immobility, friction, shearing, incontinence upon admission and as needed Notes: Wound/Skin Impairment Nursing Diagnoses: Impaired tissue integrity Goals: Patient/caregiver will verbalize understanding of skin care regimen Date Initiated: 11/30/2017 Target Resolution Date: 12/21/2017 Goal Status: Active Ulcer/skin breakdown will have a volume reduction of 30% by week 4 Date Initiated: 11/30/2017 Target Resolution Date: 12/21/2017 Goal Status: Active [name], [name] ([id number]) Interventions: Assess patient/caregiver ability to obtain necessary supplies Assess patient/caregiver ability to perform ulcer/skin care regimen upon admission and as needed Assess ulceration(s) every visit Treatment Activities: Skin care regimen initiated : 11/30/2017 Notes: Electronic Signature(s) Signed: 03/29/2019 4:33:06 PM By: Montey Hora Entered By: Montey Hora on 03/29/2019 08:33:39 [name], [name] ([id number]) -------------------------------------------------------------------------------- Pain Assessment Details Patient Name: [name] Date of Service: 03/29/2019 8:00 AM Medical Record Number: [id number] Patient Account Number: [id number] Date of Birth/Sex: 1951/02/23 (68 y.o. M) Treating RN: Army Melia Primary Care Gurneet Matarese: [name] Other Clinician: Referring Nyxon Strupp: [name] Treating Alexxa Sabet/Extender: [name], HOYT Weeks in Treatment: 106 Active Problems Location of Pain Severity and Description of Pain Patient Has Paino No Site Locations Pain Management and Medication Current Pain Management: Electronic Signature(s) Signed: 03/29/2019 11:37:35 AM By: Army Melia Entered By: Army Melia on 03/29/2019 08:11:13 [name] ([id number]) -------------------------------------------------------------------------------- Patient/Caregiver Education Details Patient Name: [name] Date of Service: 03/29/2019 8:00 AM Medical Record Number: [id number] Patient Account Number: [id number] Date of Birth/Gender: 21-Apr-1951 (68 y.o. M) Treating RN: Montey Hora Primary Care Physician: [name] Other Clinician: Referring Physician: [name] Treating Physician/Extender: Sharalyn Ink in Treatment: 58 Education Assessment Education Provided To: Patient and Caregiver Education Topics Provided Wound/Skin Impairment: Handouts: Other: skin subs Methods: Explain/Verbal Responses: State content correctly Electronic Signature(s) Signed: 03/29/2019 4:33:06 PM By: Montey Hora Entered By: Montey Hora on 03/29/2019 08:37:26 [name], [name] ([id number]) -------------------------------------------------------------------------------- Wound Assessment Details Patient Name: [name] Date of Service: 03/29/2019 8:00 AM Medical Record Number: [id number] Patient Account Number: [id number] Date of Birth/Sex: [birth year] (68 y.o. M) Treating RN: Army Melia Primary Care Claris Guymon: [name] Other Clinician: Referring Myrtice Lowdermilk: [name] Treating Nadine Ryle/Extender: [name], HOYT Weeks in Treatment: 69 Wound Status Wound Number: 1 Primary Etiology: Pressure Ulcer Wound Location: Right Gluteal fold Wound Status: Open Wounding Event: Pressure Injury Comorbid History: Cataracts, Hypertension, Paraplegia Date Acquired: 11/02/2017 Weeks Of Treatment: 69 Clustered Wound: No Photos  Wound Measurements Length: (cm) 3.5 Width: (cm) 2 Depth: (cm) 3 Area: (cm) 5.498 Volume: (cm) 16.493 % Reduction in Area: 45.3% % Reduction in Volume: -1541.1% Epithelialization: None Tunneling: Yes Position (o'clock): 12 Maximum Distance: (cm) 4 Undermining: No Wound  Description Classification: Category/Stage IV Wound Margin: Epibole Exudate Amount: Large Exudate Type: Serosanguineous Exudate Color: red, brown Foul Odor After Cleansing: No Slough/Fibrino Yes Wound Bed Granulation Amount: Medium (34-66%) Exposed Structure Granulation Quality: Red, Pink, Hyper-granulation Fascia Exposed: No Necrotic Amount: Medium (34-66%) Fat Layer (Subcutaneous Tissue) Exposed: Yes Necrotic Quality: Adherent Slough Tendon Exposed: No Muscle Exposed: Yes Necrosis of Muscle: No Joint Exposed: No [name], [name] ([id number]) Bone Exposed: No Treatment Notes Wound #1 (Right Gluteal fold) Notes zinc to peri wound, Hydrafera Blue, xtrasorb and tegaderm Electronic Signature(s) Signed: 03/29/2019 11:37:35 AM By: Army Melia Entered By: Army Melia on 03/29/2019 08:18:32 [name] ([id number]) -------------------------------------------------------------------------------- Vitals Details Patient Name: [name] Date of Service: 03/29/2019 8:00 AM Medical Record Number: [id number] Patient Account Number: [id number] Date of Birth/Sex: [birth year] (68 y.o. M) Treating RN: Army Melia Primary Care Kamill Fulbright: [name] Other Clinician: Referring Levorn Oleski: [name] Treating Llesenia Fogal/Extender: [name], HOYT Weeks in Treatment: 81 Vital Signs Time Taken: 08:11 Temperature (F): 98.8 Height (in): 73 Pulse (bpm): 68 Weight (lbs): 210 Respiratory Rate (breaths/min): 16 Body Mass Index (BMI): 27.7 Blood Pressure (mmHg): 125/65 Reference Range: 80 - 120 mg / dl Electronic Signature(s) Signed: 03/29/2019 11:37:35 AM By: Army Melia Entered By: Army Melia on 03/29/2019 08:12:02

## 2019-03-30 NOTE — Progress Notes (Signed)
REY, KUCZEK ([id number]) Visit Report for 03/29/2019 Chief Complaint Document Details Patient Name: [name], [name] Date of Service: 03/29/2019 8:00 AM Medical Record Number: [id number] Patient Account Number: [id number] Date of Birth/Sex: 1950/09/16 (68 y.o. M) Treating RN: Montey Hora Primary Care Provider: [name] Other Clinician: Referring Provider: [name] Treating Provider/Extender: [name], Yuki Purves Weeks in Treatment: 84 Information Obtained from: Patient Chief Complaint Right gluteal fold ulcer Electronic Signature(s) Signed: 03/29/2019 8:51:55 AM By: Worthy Keeler [name] Entered By: Worthy Keeler on 03/29/2019 08:51:55 [name], [name] ([id number]) -------------------------------------------------------------------------------- HPI Details Patient Name: [name] Date of Service: 03/29/2019 8:00 AM Medical Record Number: [id number] Patient Account Number: [id number] Date of Birth/Sex: Feb 25, 1951 (68 y.o. 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 He states that there is no history of diabetes that he is aware of. He does have issues with his prostate and is currently receiving treatment for this by way of oral medication. With that being said I do not have a lot of details in that regard. Nonetheless the patient presents today as a result of having been referred to [place] by another provider initially home health was set to come out and take care of his wound although due to the fact that he  apparently drives he's not able to receive home health. His wife is therefore trying to help take care of this wound within although they have been struggling with what exactly to do at this point. She states that she can do some things but she is definitely not a nurse and does have some issues with looking at blood. The good news is the wound does not appear to be too deep and is fairly superficial at this point. There is no slough noted there is some nonviable skin noted around the surface of the wound and the perimeter at this point. The central portion of the wound appears to be very good with a dermal layer noted this does not appear to be again deep enough to extend it to subcutaneous tissue at this point. 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 In the pump he has Dilaudid and baclofen. 12/07/17 on evaluation today patient presents for follow-up concerning his ongoing lower back thigh ulcer on the right. He states that he did not get the supplies ordered and therefore has not really been able to perform the dressing changes as directed exactly. His wife was able to get some Boarder Foam Dressing's from the drugstore and subsequently has been using hydrogel which did help to a degree in the wound does appear to be able smaller. There is actually more drainage this week noted than previous. 12/21/17 on evaluation today patient appears to be doing rather well in regard to his right gluteal ulcer. He has been tolerating the dressing changes without complication. There does not appear to be any evidence of infection at this point in time. Overall the wound does seem to be making some progress as far as the edges  are concerned there's not as much in the way of overlapping of the external wound edges and he has a good epithelium to wound bed border for the most part. This however is not true  right at the 12 o'clock location over the span of a little over a centimeters which actually will require debridement today to clean this away and hopefully allow it to continue to heal more appropriately. 12/28/17 on evaluation today patient appears to be doing rather well in regard to his ulcer in the left gluteal region. He's been tolerating the dressing changes without complication. Apparently he has had some difficulty getting his dressing material. Apparently there's been some confusion with ordering we're gonna check into this. Nonetheless overall he's been showing signs of improvement which is good news. Debridement is not required today. 01/04/18 on evaluation today patient presents for follow-up concerning his right gluteal ulcer. He has been tolerating the dressing changes fairly well. On inspection today it appears he may actually have some maceration them concerned about the fact that he may be developing too much moisture in and around the wound bed which can cause delay in healing. With that being said he unfortunately really has not showed significant signs of improvement since last week's evaluation in fact this may even be just the little bit/slightly larger. Nonetheless he's been having a lot of discomfort I'm not sure this is even related to the wound as he has no pain when I'm to breeding or otherwise cleaning the wound during evaluation today. Nonetheless this is something that we did recommend he talked to his pain specialist concerning. 01/11/18 on evaluation today patient appears to be doing better in regard to his ulceration. He has been tolerating the dressing changes without complication. With that being said overall there's no evidence of infection which is good news. The only thing is he did receive the hatch affair blue classic versus the ready nonetheless I feel like this is perfectly fine and appears to have done well for him over the past week. 01/25/18 on evaluation  today patient's wound actually appears to be a little bit larger than during the last evaluation. The good [name], [name]. ([id number]) news is the majority of the wound edges actually appear to be fairly firmly attached to the wound bed unfortunately again we're not really making progress in regard to the size. Roughly the wound is about the same size as when I first saw him although again the wound margin/edges appear to be much better. 02/01/18 on evaluation today patient actually appears to be doing very well in regard to his wound. Applying the Prisma dry does seem to be better although he does still have issues with slow progression of the wound. There was a slight improvement compared to last week's measurements today. Nonetheless I have been considering other options as far as the possibility of Theraskin or even a snap vac. In general I'm not sure that the Theraskin due to location of the wound would be a very good idea. Nonetheless I do think that a snap vac could be a possibility for the patient and in fact I think this could even be an excellent way to manage the wound possibly seeing some improvement in a very rapid fashion here. Nonetheless this is something that we would need to get approved and I did have a lengthy conversation with the patient about this today. 02/08/18 on evaluation today patient appears to be doing a little better in regard to his ulcer.  He has been tolerating the dressing changes without complication. Fortunately despite the fact that the wound is a little bit smaller it's not significantly so unfortunately. We have discussed the possibility of a snap vac we did check with insurance this is actually covered at this point. Fortunately there does not appear to be any sign of infection. Overall I'm fairly pleased with how things seem to be appearing at this point. 02/15/18 on evaluation today patient appears to be doing rather well in regard to his right gluteal  ulcer. Unfortunately the snap vac did not stay in place with his sheer and friction this came loose and did not seem to maintain seal very well. He worked for about two days and it did seem to do very well during that time according to his wife but in general this does not seem to be something that's gonna be beneficial for him long-term. I do believe we need to go back to standard dressings to see if we can find something that will be of benefit. 03/02/18- He is here in follow up evaluation; there is minimal change in the wound. He will continue with the same treatment plan, would consider changing to iodosrob/iodoflex if ulcer continues to to plateau. He will follow up next week 03/08/18 on evaluation today patient's wound actually appears to be about the same size as when I previously saw him several weeks back. Unfortunately he does have some slightly dark discoloration in the central portion of the wound which has me concerned about pressure injury. I do believe he may be sitting for too long a period of time in fact he tells me that "I probably sit for much too long". He does have some Slough noted on the surface of the wound and again as far as the size of the wound is concerned I'm really not seeing anything that seems to have improved significantly. 03/15/18 on evaluation today patient appears to be doing fairly well in regard to his ulcer. The wound measured pretty much about the same today compared to last week's evaluation when looking at his graph. With that being said the area of bruising/deep tissue injury that was noted last week I do not see at this point. He did get a new cushion fortunately this does seem to be have been of benefit in my pinion. It does appear that he's been off of this more which is good news as well I think that is definitely showing in the overall wound measurements. With that being said I do believe that he needs to continue to offload I don't think that the fact  this is doing better should be or is going to allow him to not have to offload and explain this to him as well. Overall he seems to be in agreement the plan I think he understands. The overall appearance of the wound bed is improved compared to last week I think the Iodoflex has been beneficial in that regard. 03/29/18 on evaluation today patient actually appears to be doing rather well in regard to his wound from the overall appearance standpoint he does have some granulation although there's some Slough on the surface of the wound noted as well. With that being said he unfortunately has not improved in regard to the overall measurement of the wound in volume or in size. I did have a discussion with him very specifically about offloading today. He actually does work although he mainly is just sitting throughout the day. He tells me  he offloads by "lifting himself up for 30 seconds off of his chair occasionally" purchase from [employer] which does seem to have helped. And he has a new cushion that he with that being said he's also able to stand some for a very short period of time but not significant enough I think to provide appropriate offloading. I think the biggest issue at this point with the wound and the fact is not healing as quickly as we would like is due to the fact that he is really not able to appropriately offload while at work. He states the beginning after his injury he actually had a bed at his job that he could lay on in order to offload and that does seem to have been of help back at that time. Nonetheless he had not done this in quite some time unfortunately. I think that could be helpful for him this is something I would like for him to look into. 04/05/18 on evaluation today patient actually presents for follow-up concerning his right gluteal ulcer. Again he really is not significantly improved even compared to last week. He has been tolerating the dressing changes without  complication. With that being said fortunately there appears to be no evidence of infection at this time. He has been more proactive in trying to offload. [name], BOEHLKE ([id number]) 04/12/18 on evaluation today patient actually appears to be doing a little better in regard to his wound and the right gluteal fold region. He's been tolerating the dressing changes since removing the oasis without complication. However he was having a lot of burning initially with the oasis in place. He's unsure of exactly why this was given so much discomfort but he assumes that it was the oasis itself causing the problem. Nonetheless this had to be removed after about three days in place although even those three days seem to have made a fairly good improvement in regard to the overall appearance of the wound bed. In fact is the first time that he's made any improvement from the standpoint of measurements in about six weeks. He continues to have no discomfort over the area of the wound itself which leads me to wonder why he was having the burning with the oasis when he does not even feel the actual debridement's themselves. I am somewhat perplexed by this. 04/19/18 on evaluation today patient's wound actually appears to be showing signs of epithelialization around the edge of the wound and in general actually appears to be doing better which is good news. He did have the same burning after about three days with applying the Endoform last week in the same fashion that I would generally apply a skin substitute. This seems to indicate that it's not the oasis to cause the problem but potentially the moisture buildup that just causes things to burn or there may be some other reaction with the skin prep or Steri-Strips. Nonetheless I'm not sure that is gonna be able to tolerate any skin substitute for a long period of time. The good news is the wound actually appears to be doing better today compared to last week and  does seem to finally be making some progress. 04/26/18 on evaluation today patient actually appears to be doing rather well in regard to his ulcer in the right gluteal fold. He has been tolerating the dressing changes without complication which is good news. The Endoform does seem to be helping although he was a little bit more macerated this week. This  seems to be an ongoing issue with fluid control at this point. Nonetheless I think we may be able to add something like Drawtex to help control the drainage. 05/03/18 on evaluation today patient appears to actually be doing better in regard to the overall appearance of his wound. He has been tolerating the dressing changes without complication. Fortunately there appears to be no evidence of infection at this time. I really feel like his wound has shown signs as of today of turning around last week I thought so as well and definitely he could be seen in this week's overall appearance and measurements. In general I'm very pleased with the fact that he finally seems to be making a steady but sure progress. The patient likewise is very pleased. 05/17/18 on evaluation today patient appears to be doing more poorly unfortunately in regard to his ulcer. He has been tolerating the dressing changes without complication. With that being said he tells me that in the past couple of days he and his wife have noticed that we did not seem to be doing quite as well is getting dark near the center. Subsequently upon evaluation today the wound actually does appear to be doing worse compared to previous. He has been tolerating the dressing changes otherwise and he states that he is not been sitting up anymore than he was in the past from what he tells me. Still he has continued to work he states "I'm tired of dealing with this and if I have to just go home and lay in the bed all the time that's what I'll do". Nonetheless I am concerned about the fact that this wound does  appear to be deeper than what it was previous. 05/24/18 upon evaluation today patient actually presents after having been in the hospital due to what was presumed to be sepsis secondary to the wound infection. He had an elevated white blood cell count between 14 and 15. With that being said he does seem to be doing somewhat better now. His wound still is giving him some trouble nonetheless and he is obviously concerned about the fact likely talked about that this does seem to go more deeply than previously noted. I did review his wound culture which showed evidence of Staphylococcus aureus him and group B strep. Nonetheless he is on antibiotics, Levaquin, for this. Subsequently I did review his intake summary from the hospital as well. I also did look at the CT of the lumbar spine with contrast that was performed which showed no bone destruction to suggest lumbar disguises/osteomyelitis or sacral osteomyelitis. There was no paraspinal abscess. Nonetheless it appears this may have been more of just a soft tissue infection at this point which is good news. He still is nonetheless concerned about the wound which again I think is completely reasonable considering everything he's been through recently. 05/31/18 on evaluation today on evaluation today patient actually appears to be showing signs of his wound be a little bit deeper than what I would like to see. Fortunately he does not show any signs of significant infection although his temperature was 99 today he states he's been checking this at home and has not been elevated. Nonetheless with the undermining that I'm seeing at this point I am becoming more concerned about the wound I do think that offloading is a key factor here that is preventing the speedy recovery at this point. There does not appear to be any evidence of again over infection noted. He's been using  Santyl currently. 06/07/18 the patient presents today for follow-up evaluation  regarding the left ulcer in the gluteal region. He has been tolerating the Wound VAC fairly well. He is obviously very frustrated with this he states that to mean is really getting in his way. There does not appear to be any evidence of infection at this time he does have a little bit of odor I do not necessarily associate this with infection just something that we sometimes notice with Wound VAC therapy. With that being said I can definitely catch a tone of discontentment overall in the patient's demeanor today. This when he was previously in the hospital [name], [name]. ([id number]) an CT scan was done of the lumbar region which did not reveal any signs of osteomyelitis. With that being said the pelvis in particular was not evaluated distinctly which means he could still have some osteonecrosis I. Nonetheless the Wound VAC was started on Thursday I do want to get this little bit more time before jumping to a CT scan of the pelvis although that is something that I might would recommend if were not see an improvement by that time. 06/14/18 on evaluation today patient actually appears to be doing about the same in regard to his right gluteal ulcer. Again he did have a CT scan of the lumbar spine unfortunately this did not include the pelvis. Nonetheless with the depth of the wound that I'm seeing today even despite the fact that I'm not seeing any evidence of overt cellulitis I believe there's a good chance that we may be dealing with osteomyelitis somewhere in the right Ischial region. No fevers, chills, nausea, or vomiting noted at this time. 06/21/18 on evaluation today patient actually appears to be doing about the same with regard to his wound. The tunnel at 6 o'clock really does not appear to be any deeper although it is a little bit wider. I think at this point you may want to start packing this with white phone. Unfortunately I have not got approval for the CT scan of the pelvis as of yet  due to the fact that Medicare apparently has been denied it due to the diagnosis codes not being appropriate according to Medicare for the test requested. With that being said the patient cannot have an MRI and therefore this is the only option that we have as far as testing is concerned. The patient has had infection and was on antibiotics and been added code for cellulitis of the bottom to see if this will be appropriate for getting the test approved. Nonetheless I'm concerned about the infection have been spread deeper into the Ischial region. 06/28/18 on evaluation today patient actually appears to be doing rather well all things considered in regard to the right gluteal ulcer. He has been tolerating the dressing changes without complication. With that being said the Wound VAC he states does have to be replaced almost every day or at least reinforced unfortunately. Patient actually has his CT scan later this morning we should have the results by tomorrow. 07/05/18 on evaluation today patient presents for follow-up concerning his right Ischial ulcer. He did see the surgeon Dr. Lysle Pearl last week. They were actually very happy with him and felt like he spent a tremendous amount of time with them as far as discussing his situation was concerned. In the end Dr. Lysle Pearl did contact me as well and determine that he would not recommend any surgical intervention at this point as he felt like  it would not be in the patient's best interest based on what he was seeing. He recommended a referral to infectious disease. Subsequently this is something that Dr. Ines Bloomer office is working on setting up for the patient. As far as evaluation today is concerned the patient's wound actually appears to be worse at this point. I am concerned about how things are progressing and specifically about infection. I do not feel like it's the deeper but the area of depth is definitely widening which does have me concerned. No  fevers, chills, nausea, or vomiting noted at this time. I think that we do need initiate antibiotic therapy the patient has an allow allergy to amoxicillin/penicillin he states that he gets a rash since childhood. Nonetheless she's never had the issues with Catholics or cephalosporins in general but he is aware of. 07/27/18 on evaluation today patient presents following admission to the hospital on 07/09/18. He was subsequently discharged on 07/20/18. On 07/15/18 the patient underwent irrigation and debridement was soft tissue biopsy and bone biopsy as well as placement of a Wound VAC in the OR by Dr. Celine Ahr. During the hospital course the patient was placed on a Wound VAC and recommended follow up with surgery in three weeks actually with Dr. Delaine Lame who is infectious disease. The patient was on vancomycin during the hospital course. 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 He also had a bone culture which revealed evidence of methicillin-resistant staph aureus. He is updated CT scan 07/09/18 reveals that he had progression of the which was performed on wound to breakdown down to the trochanter where he actually had irregularities there as well suggestive of osteomyelitis. This was a change just since 9 December when we last performed a CT scan. Obviously this one had gone downhill quite significantly and rapidly. At this point upon evaluation I feel like in general the patient's wound seems to be doing fairly well all things considered upon my evaluation today. Obviously this is larger and deeper than what I previously evaluated but at the same time he seems to be making some progress as far as the appearance of the granulation tissue is concerned. I'm happy in that regard. No fevers, chills, nausea, or vomiting noted at this time. He is on IV vancomycin and Rocephin at the facility. He is currently in [employer]. 08/03/18 upon evaluation today  patient's wound appears to be doing better in regard to the overall appearance at this point in time. Fortunately he's been tolerating the Wound VAC without complication and states that the facility has been taking excellent care of the wound site. Overall I see some Slough noted on the surface which I am going to attempt sharp debridement today of but nonetheless other than this I feel like he's making progress. 08/09/18 on evaluation today patient's wound appears to be doing much better compared to even last week's evaluation. Do believe that the Wound VAC is been of great benefit for him. He has been tolerating the dressing changes that is the Wound LAREN, [name]. ([id number]) VAC without any complication and he has excellent granulation noted currently. There is no need for sharp debridement at this point. 08/16/18 on evaluation today patient actually appears to be doing very well in regard to the wound in the right gluteal fold region. This is showing signs of progress and again appears to be very healthy which is excellent news. Fortunately there is no sign of active infection by way of odor  or drainage at this point. Overall I'm very pleased with how things stand. He seems to be tolerating the Wound VAC without complication. 08/23/18 on evaluation today patient actually appears to be doing better in regard to his wound. He has been tolerating the Wound VAC without complication and in fact it has been collecting a significant amount of drainage which I think is good news especially considering how the wound appears. Fortunately there is no signs of infection at this time definitely nothing appears to be worse which is good news. He has not been started on the Bactrim and Flagyl that was recommended by Dr. Delaine Lame yet. I did actually contact her office this morning in order to check and see were things are that regard their gonna be calling me back. 08/30/18 on evaluation today patient  actually appears to show signs of excellent improvement today compared to last evaluation. The undermining is getting much better the wound seems to be feeling quite nicely and I'm very pleased that the granulation in general. With that being said overall I feel like the patient has made excellent progress which is great news. No fevers, chills, nausea, or vomiting noted at this time. 09/06/18 on evaluation today patient actually appears to be doing rather well in regard to his right gluteal ulcer. This is showing signs of improvement in overall I'm very pleased with how things seem to be progressing. The patient likewise is please. Overall I see no evidence of infection he is about to complete his oral antibiotic regimen which is the end of the antibiotics for him in just about three days. 09/13/18 on evaluation today patient's right Ischial ulcer appears to be showing signs of continued improvement which is excellent news. He's been tolerating the dressing changes without complication. Fortunately there's no signs of infection and the wound that seems to be doing very well. 09/28/18 on evaluation today patient appears to be doing rather well in regard to his right Ischial ulcer. He's been tolerating the Wound VAC without complication he knows there's much less drainage than there used to be this obviously is not a bad thing in my pinion. There's no evidence of infection despite the fact is but nothing about it now for several weeks. 10/04/18 on evaluation today patient appears to be doing better in regard to his right Ischial wound. He has been tolerating the Wound VAC without complication and I do believe that the silver nitrate last week was beneficial for him. Fortunately overall there's no evidence of active infection at this time which is great news. No fevers, chills, nausea, or vomiting noted at this time. 10/11/18 on evaluation today patient actually appears to be doing rather well in regard  to his Ischial ulcer. He's been tolerating the Wound VAC still without complication I feel like this is doing a good job. No fevers, chills, nausea, or vomiting noted at this time. 11/01/18 on evaluation today patient presents after having not been seen in our clinic for several weeks secondary to the fact that he was on evaluation today patient presents after having not been seen in our clinic for several weeks secondary to the fact that he was in a skilled nursing facility which was on lockdown currently due to the covert 19 national emergency. Subsequently he was discharged from the facility on this past Friday and subsequently made an appointment to come in to see yesterday. Fortunately there's no signs of active infection at this time which is good news and overall he does  seem to have made progress since I last saw. Overall I feel like things are progressing quite nicely. The patient is having no pain. 11/08/18 on evaluation today patient appears to be doing okay in regard to his right gluteal ulcer. He has been utilizing a Wound VAC home health this changing this at this point since he's home from the skilled nursing facility. Fortunately there's no signs of obvious active infection at this time. Unfortunately though there's no obvious active infection he is having some maceration and his wife states that when the sheets of the Wound VAC office on Sunday when it broke seal that he ended up having significant issues with some smell as well there concerned about the possibility of infection. Fortunately there's No fevers, chills, nausea, or vomiting noted at this time. 11/15/18 on evaluation today patient actually appears to be doing well in regard to his right gluteal ulcer. He has been tolerating the dressing changes without complication. Specifically the Wound VAC has been utilized up to this point. Fortunately there's no signs of infection and overall I feel like he has made progress even  since last week when I last saw him. I'm actually fairly happy with the overall appearance although he does seem to have somewhat of a hyper granular Dorion, Rodric J. ([id number]) overgrowth in the central portion of the wound which I think may require some sharp debridement to try flatness out possibly utilizing chemical cauterization following. 11/23/18 on evaluation today patient actually appears to be doing very well in regard to his sacral ulcer. He seems to be showing signs of improvement with good granulation. With that being said he still has the small area of hyper granulation right in the central portion of the wound which I'm gonna likely utilize silver nitrate on today. Subsequently he also keeps having a leak at the 6 o'clock location which is unfortunate we may be able to help out with some suggestions to try to prevent this going forward. Fortunately there's no signs of active infection at this time. 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 He's been tolerating the dressing changes without complication. Fortunately there's no signs of active infection at this time. I've been rather pleased with how things have progressed there still some evidence of pressure getting to the area with some redness right around the immediate wound opening. Nonetheless other than this I'm not seeing any significant complications or issues the wound is somewhat hyper granular. Upon discussing with the patient and his wife today I'm not sure that the wound is being packed to the base with the foam at this point. And if it's not been packed fully that may be part of the reason why is not seen as much improvement as far as the granulation from the base out. Again we do not want pack too tightly but we need some of the firm to get to the base of the wound. I discussed this with patient and his wife  today. 12/06/18 on evaluation today patient appears to be doing well in regard to his right gluteal pressure ulcer. He's been tolerating the dressing changes without complication. Fortunately there's no signs of active infection. He still has some hyper granular tissue and I do think it would be appropriate to continue with the chemical cauterization as of today. 12/16/18 on evaluation today patient actually appears to be doing okay in regard to his right  gluteal ulcer. He is been tolerating the dressing changes without complication including the Wound VAC. Overall I feel like nothing seems to be worsening I do feel like that the hyper granulation buds in the central portion of the wound have improved to some degree with the silver nitrate. We will have to see how things continue to progress. 12/20/18 on evaluation today patient actually appears to be doing much worse in my pinion even compared to last week's evaluation. Unfortunately as opposed to showing any signs of improvement the areas of hyper granular tissue in the central portion of the wound seem to be getting worse. Subsequently the wound bed itself also seems to be getting deeper even compared to last week which is both unusual as well as concerning since prior he had been shown signs of improvement. Nonetheless I think that the issue could be that he's actually having some difficulty in issues with a deeper infection. There's no external signs of infection but nonetheless I am more worried about the internal, osteomyelitis, that could be restarting. He has not been on antibiotics for some time at this point. I think that it may be a good idea to go ahead and started back on an antibiotic therapy while we wait to see what the testing shows. 12/27/18 on evaluation today patient presents for follow-up concerning his left gluteal fold wound. Fortunately he appears to be doing well today. I did review the CT scan which was negative for any signs of  osteomyelitis or acute abnormality this is excellent news. Overall I feel like the surface of the wound bed appears to be doing significantly better today compared to previously noted findings. There does not appear any signs of infection nor does he have any pain at this time. 01/03/19 on evaluation today patient actually appears to be doing quite well in regard to his ulcer. Post debridement last week he really did not have too much bleeding which is good news. Fortunately today this seems to be doing some better but we still has some of the hyper granular tissue noted in the base of the wound which is gonna require sharp debridement today as well. Overall I'm pleased with how things seem to be progressing since we switched away from the Wound VAC I think he is making some progress. 01/10/19 on evaluation today patient appears to be doing better in regard to his right gluteal fold ulcer. He has been tolerating the dressing changes without complication. The debridement to seem to be helping with current away some of the poor hyper granular tissue bugs throughout the region of his gluteal fold wound. He's been tolerating the dressing changes otherwise without complication which is great news. No fevers, chills, nausea, or vomiting noted at this time. 01/17/19 on evaluation today patient actually appears to be doing excellent in regard to his wound. He's been tolerating the dressing changes without complication. Fortunately there is no signs of active infection at this time which is great news. No fevers, chills, nausea, or vomiting noted at this time. 01/24/19 on evaluation today patient actually appears to be doing quite well with regard to his ulcer. He has been tolerating the dressing changes without complication. Fortunately there's no signs of active infection at this time. Overall been very pleased with the progress that he seems to be making currently. 01/31/19-Patient returns at 1 week with  apparent similarity in dimensions to the wound, with no signs of infection, he has been [place] ([id number]) changing dressings twice  a day 02/08/19 upon evaluation today patient actually appears to be doing well with regard to his right Ischial ulcer. The wound is not appear to be quite as deep and seems to be making progress which is good news. With that being said I'm still reluctant to go back to the Wound VAC at this point. He's been having to change the dressings twice a day which is a little bit much in my pinion from the wound care supplies standpoint. I think that possibly attempting to utilize extras orbit may be beneficial this may also help to prevent any additional breakdown secondary to fluid retention in the wound itself. The patient is in agreement with giving this a try. 02/15/19 on evaluation today patient actually appears to be doing decently well with regard to his ulcer in the right to gluteal fold location. He's been tolerating the dressing changes without complication. Fortunately there is no signs of active infection at this time. He is able to keep the current dressing in place more effectively for a day at a time whereas before he was having a changes to to three times a day. The actions or has been helpful in this regard. Fortunately there's no signs of anything getting worse and I do feel like he showing signs of good improvement with regard to the wound bed status. 02/22/2019 patient appears to be doing very well today with regard to his ulcer in the gluteal fold. Fortunately there is no signs of active infection and he has been tolerating the dressing changes without any complication. Overall extremely pleased with how things seem to be progressing. He has much less of the hyper granular projections within the wound these have slowly been debrided away and he seems to be doing well. The wound bed is more uniform. 03/01/19 on evaluation today patient appears to  be doing unfortunately about the same in regard to his gluteal ulcer. He's been tolerating the dressing changes without complication. Fortunately there's no signs of active infection at this time. With that being said he continues to develop these hyper granular projections which I'm unsure of exactly what they are and why they are rising. Nonetheless I explained to the patient that I do believe it would be a good idea for [place] to stand a biopsy sample for pathology to see if that can shed any light on what exactly may be going on here. Fortunately I do not see any obvious signs of infection. With that being said the patient has had a little bit more drainage this week apparently compared to last week. 03/08/2019 on evaluation today patient actually appears to look somewhat better with regard to the appearance of his wound bed at this time. This is good news. Overall I am very pleased with how things seem to have progressed just in the past week with a switch to the [facility] dressing. I think that has been beneficial for him. With that being said at this time the patient is concerned about his biopsy that I sent off last week unfortunately I do not have that report as of yet. Nonetheless we have called to obtain this and hopefully will hear back from the lab later this morning. 03/15/19 on evaluation today patient's wound actually appears to be doing okay today with regard to the overall appearance of the wound bed. He has been tolerating the dressing changes without complication he still has hyper granular tissue noted but fortunately that seems to be minimal at this point compared  to some of what we've seen in the past. Nonetheless I do think that he is still having some issues currently with some of this type of granulation the biopsy and since all showed nothing more than just evidence of granulation tissue. Therefore there really is nothing different to initiate or do at this point. 03/24/19 on  evaluation today patient appears to be doing a little better with regard to his ulcer. He's been tolerating the dressing changes without complication. Fortunately there is no signs of active infection at this time. No fevers, chills, nausea, or vomiting noted at this time. I'm overall pleased with how things seem to be progressing. 03/29/2019 on evaluation today patient appears to be doing about the same in regard to his ulcer in the right gluteal fold. Unfortunately he is not seeming to make a lot of progress and the wound is somewhat stalled. There is no signs of active infection externally but I am concerned about the possibility of infection continuing in the ischial location which previously he did have infection noted. Again is not able to have an MRI so probably our best option for testing for this would be a triple phase bone scan which will detect subtle changes in the bone more so than plain film x-rays. In the past they really have not been beneficial and in fact the CT scans even have been somewhat questionable at times. Nonetheless there is no signs of systemic infection which is at least good news but again his wound is not healing at all on the predicted schedule. Electronic Signature(s) Signed: 03/29/2019 8:53:28 AM By: Worthy Keeler [name] Entered By: Worthy Keeler on 03/29/2019 08:53:27 [name], [name] ([id number]) -------------------------------------------------------------------------------- Physical Exam Details Patient Name: [name] Date of Service: 03/29/2019 8:00 AM Medical Record Number: [id number] Patient Account Number: [id number] Date of Birth/Sex: [birth year] (68 y.o. M) Treating RN: Montey Hora Primary Care Provider: [name] Other Clinician: Referring Provider: [name] Treating Provider/Extender: [name], Reah Justo Weeks in Treatment: 14 Constitutional Well-nourished and well-hydrated in no acute distress. Respiratory normal  breathing without difficulty. clear to auscultation bilaterally. Cardiovascular regular rate and rhythm with normal S1, S2. Psychiatric this patient is able to make decisions and demonstrates good insight into disease process. Alert and Oriented x 3. pleasant and cooperative. Notes Patient's wound bed currently showed signs of some granulation although he did have increased evidence of hyper granular budding which again is not necessarily good the tissue is very poor and again does not seem to be promoting overall healing of the wound bed unfortunately. This does have me more concerned about if there is underlying infection as well. Electronic Signature(s) Signed: 03/29/2019 8:53:59 AM By: Worthy Keeler [name] Entered By: Worthy Keeler on 03/29/2019 08:53:58 [name], [name] ([id number]) -------------------------------------------------------------------------------- Physician Orders Details Patient Name: [name] Date of Service: 03/29/2019 8:00 AM Medical Record Number: [id number] Patient Account Number: [id number] Date of Birth/Sex: May 30, 1951 (68 y.o. M) Treating RN: Montey Hora Primary Care Provider: [name] Other Clinician: Referring Provider: [name] Treating Provider/Extender: [name], Sahmir Weatherbee Weeks in Treatment: 31 Verbal / Phone Orders: No Diagnosis Coding Wound Cleansing Wound #1 Right Gluteal fold o Other: - Cleanse wound with Dakin's Solution Anesthetic (add to Medication List) Wound #1 Right Gluteal fold o Topical Lidocaine 4% cream applied to wound bed prior to debridement (In Clinic Only). Skin Barriers/Peri-Wound Care Wound #1 Right Gluteal fold o Other: - Apply zinc oxide to peri-wound area. Primary Wound Dressing Wound #1  Right Gluteal fold o Hydrafera Blue Ready Transfer Secondary Dressing Wound #1 Right Gluteal fold o XtraSorb - Secure with Tegaderm Dressing Change Frequency Wound #1 Right Gluteal fold o  Change dressing every day. Follow-up Appointments Wound #1 Right Gluteal fold o Return Appointment in 1 week. Off-Loading Wound #1 Right Gluteal fold o Turn and reposition every 2 hours Radiology o Bone Scan Electronic Signature(s) Signed: 03/29/2019 4:33:06 PM By: Montey Hora Signed: 03/29/2019 5:39:49 PM By: Worthy Keeler [name] Entered By: Montey Hora on 03/29/2019 08:49:35 [name], [name] ([id number]) [name], [name] ([id number]) -------------------------------------------------------------------------------- Problem List Details Patient Name: [name] Date of Service: 03/29/2019 8:00 AM Medical Record Number: [id number] Patient Account Number: [id number] Date of Birth/Sex: [birth year] (68 y.o. 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 He states that there is no history of diabetes that he is aware of. He does have issues with his prostate and is currently receiving treatment for this by way of oral medication. With that being said I do not have a lot of details in that regard. Nonetheless the patient presents today as a result of having been referred to [place] by another provider initially home health was set to come out and take care of his wound although due to the fact that he apparently drives he's not able to receive home health. His wife is therefore trying to help take care of this wound within although they have been struggling with what exactly to do at this point. She states that she can do some things but she is definitely not a nurse and does have some issues with looking at blood. The good news is the wound does not appear to be too deep and is fairly superficial at this point. There is no slough noted there is some nonviable skin noted around the surface of the wound and the perimeter at this point. The central portion of the wound appears to be very good with a dermal layer noted this does not appear to be again deep enough to extend it to subcutaneous tissue at this point. 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 In  the pump he has Dilaudid and baclofen. 12/07/17 on evaluation today patient presents for follow-up concerning his ongoing lower  back thigh ulcer on the right. He states that he did not get the supplies ordered and therefore has not really been able to perform the dressing changes as directed exactly. His wife was able to get some [place]er Foam Dressing's from the drugstore and subsequently has been using hydrogel which did help to a degree in the wound does appear to be able smaller. There is actually more drainage this week noted than previous. 12/21/17 on evaluation today patient appears to be doing rather well in regard to his right gluteal ulcer. He has been tolerating the dressing changes without complication. There does not appear to be any evidence of infection at this point in time. Overall the wound does seem to be making some progress as far as the edges are concerned there's not as much in the way of overlapping of the external wound edges and he has a good epithelium to wound bed border for the most part. This however is not true right at the 12 o'clock location over the span of a little over a centimeters which actually will require debridement today to clean this away and hopefully allow it to continue to heal more appropriately. 12/28/17 on evaluation today patient appears to be doing rather well in regard to his ulcer in the left gluteal region. He's been tolerating the dressing changes without complication. Apparently he has had some difficulty getting his dressing material. Apparently there's been some confusion with ordering we're gonna check into this. Nonetheless overall he's been showing signs of improvement which is good news. Debridement is not required today. 01/04/18 on evaluation today patient presents for follow-up concerning his right gluteal ulcer. He has been tolerating the dressing changes fairly well. On inspection today it appears he may actually have some maceration them concerned about the fact that he may be developing too much moisture in and around the wound bed which can cause delay in  healing. With that being said he unfortunately really has not showed significant signs of improvement since last week's evaluation in fact this may even be just the little bit/slightly larger. Nonetheless he's been having a lot of discomfort I'm not sure this is even related to the wound as he has no pain when I'm to breeding or otherwise cleaning the wound during evaluation today. Nonetheless this is something that we did recommend he talked to his pain specialist concerning. 01/11/18 on evaluation today patient appears to be doing better in regard to his ulceration. He has been tolerating the dressing Mounce, Niel J. ([id number]) changes without complication. With that being said overall there's no evidence of infection which is good news. The only thing is he did receive the hatch affair blue classic versus the ready nonetheless I feel like this is perfectly fine and appears to have done well for him over the past week. 01/25/18 on evaluation today patient's wound actually appears to be a little bit larger than during the last evaluation. The good news is the majority of the wound edges actually appear to be fairly firmly attached to the wound bed unfortunately again we're not really making progress in regard to the size. Roughly the wound is about the same size as when I first saw him although again the wound margin/edges appear to be much better. 02/01/18 on evaluation today patient actually appears to be doing very well in regard to his wound. Applying the  Prisma dry does seem to be better although he does still have issues with slow progression of the wound. There was a slight improvement compared to last week's measurements today. Nonetheless I have been considering other options as far as the possibility of Theraskin or even a snap vac. In general I'm not sure that the Theraskin due to location of the wound would be a very good idea. Nonetheless I do think that a snap vac could be a  possibility for the patient and in fact I think this could even be an excellent way to manage the wound possibly seeing some improvement in a very rapid fashion here. Nonetheless this is something that we would need to get approved and I did have a lengthy conversation with the patient about this today. 02/08/18 on evaluation today patient appears to be doing a little better in regard to his ulcer. He has been tolerating the dressing changes without complication. Fortunately despite the fact that the wound is a little bit smaller it's not significantly so unfortunately. We have discussed the possibility of a snap vac we did check with insurance this is actually covered at this point. Fortunately there does not appear to be any sign of infection. Overall I'm fairly pleased with how things seem to be appearing at this point. 02/15/18 on evaluation today patient appears to be doing rather well in regard to his right gluteal ulcer. Unfortunately the snap vac did not stay in place with his sheer and friction this came loose and did not seem to maintain seal very well. He worked for about two days and it did seem to do very well during that time according to his wife but in general this does not seem to be something that's gonna be beneficial for him long-term. I do believe we need to go back to standard dressings to see if we can find something that will be of benefit. 03/02/18- He is here in follow up evaluation; there is minimal change in the wound. He will continue with the same treatment plan, would consider changing to iodosrob/iodoflex if ulcer continues to to plateau. He will follow up next week 03/08/18 on evaluation today patient's wound actually appears to be about the same size as when I previously saw him several weeks back. Unfortunately he does have some slightly dark discoloration in the central portion of the wound which has me concerned about pressure injury. I do believe he may be sitting  for too long a period of time in fact he tells me that "I probably sit for much too long". He does have some Slough noted on the surface of the wound and again as far as the size of the wound is concerned I'm really not seeing anything that seems to have improved significantly. 03/15/18 on evaluation today patient appears to be doing fairly well in regard to his ulcer. The wound measured pretty much about the same today compared to last week's evaluation when looking at his graph. With that being said the area of bruising/deep tissue injury that was noted last week I do not see at this point. He did get a new cushion fortunately this does seem to be have been of benefit in my pinion. It does appear that he's been off of this more which is good news as well I think that is definitely showing in the overall wound measurements. With that being said I do believe that he needs to continue to offload I don't think that the fact  this is doing better should be or is going to allow him to not have to offload and explain this to him as well. Overall he seems to be in agreement the plan I think he understands. The overall appearance of the wound bed is improved compared to last week I think the Iodoflex has been beneficial in that regard. 03/29/18 on evaluation today patient actually appears to be doing rather well in regard to his wound from the overall appearance standpoint he does have some granulation although there's some Slough on the surface of the wound noted as well. With that being said he unfortunately has not improved in regard to the overall measurement of the wound in volume or in size. I did have a discussion with him very specifically about offloading today. He actually does work although he mainly is just sitting throughout the day. He tells me he offloads by "lifting himself up for 30 seconds off of his chair occasionally" purchase from [employer] which does seem to have helped. And he has  a new cushion that he with that being said he's also able to stand some for a very short period of time but not significant enough I think to provide appropriate offloading. I think the biggest issue at this point with the wound and the fact is not healing as quickly as we would like is due to the fact that he is really not able to appropriately offload while at work. He states the beginning after his injury he actually had a bed at his job that he could lay on in order to offload and that does seem to have been of help back at that time. Nonetheless he had not done this in quite some time unfortunately. I think that could be helpful for him this is something I would like for him to look into. OBED, METZEN ([id number]) 04/05/18 on evaluation today patient actually presents for follow-up concerning his right gluteal ulcer. Again he really is not significantly improved even compared to last week. He has been tolerating the dressing changes without complication. With that being said fortunately there appears to be no evidence of infection at this time. He has been more proactive in trying to offload. 04/12/18 on evaluation today patient actually appears to be doing a little better in regard to his wound and the right gluteal fold region. He's been tolerating the dressing changes since removing the oasis without complication. However he was having a lot of burning initially with the oasis in place. He's unsure of exactly why this was given so much discomfort but he assumes that it was the oasis itself causing the problem. Nonetheless this had to be removed after about three days in place although even those three days seem to have made a fairly good improvement in regard to the overall appearance of the wound bed. In fact is the first time that he's made any improvement from the standpoint of measurements in about six weeks. He continues to have no discomfort over the area of the wound itself  which leads me to wonder why he was having the burning with the oasis when he does not even feel the actual debridement's themselves. I am somewhat perplexed by this. 04/19/18 on evaluation today patient's wound actually appears to be showing signs of epithelialization around the edge of the wound and in general actually appears to be doing better which is good news. He did have the same burning after about three days with applying the  Endoform last week in the same fashion that I would generally apply a skin substitute. This seems to indicate that it's not the oasis to cause the problem but potentially the moisture buildup that just causes things to burn or there may be some other reaction with the skin prep or Steri-Strips. Nonetheless I'm not sure that is gonna be able to tolerate any skin substitute for a long period of time. The good news is the wound actually appears to be doing better today compared to last week and does seem to finally be making some progress. 04/26/18 on evaluation today patient actually appears to be doing rather well in regard to his ulcer in the right gluteal fold. He has been tolerating the dressing changes without complication which is good news. The Endoform does seem to be helping although he was a little bit more macerated this week. This seems to be an ongoing issue with fluid control at this point. Nonetheless I think we may be able to add something like Drawtex to help control the drainage. 05/03/18 on evaluation today patient appears to actually be doing better in regard to the overall appearance of his wound. He has been tolerating the dressing changes without complication. Fortunately there appears to be no evidence of infection at this time. I really feel like his wound has shown signs as of today of turning around last week I thought so as well and definitely he could be seen in this week's overall appearance and measurements. In general I'm very pleased with  the fact that he finally seems to be making a steady but sure progress. The patient likewise is very pleased. 05/17/18 on evaluation today patient appears to be doing more poorly unfortunately in regard to his ulcer. He has been tolerating the dressing changes without complication. With that being said he tells me that in the past couple of days he and his wife have noticed that we did not seem to be doing quite as well is getting dark near the center. Subsequently upon evaluation today the wound actually does appear to be doing worse compared to previous. He has been tolerating the dressing changes otherwise and he states that he is not been sitting up anymore than he was in the past from what he tells me. Still he has continued to work he states "I'm tired of dealing with this and if I have to just go home and lay in the bed all the time that's what I'll do". Nonetheless I am concerned about the fact that this wound does appear to be deeper than what it was previous. 05/24/18 upon evaluation today patient actually presents after having been in the hospital due to what was presumed to be sepsis secondary to the wound infection. He had an elevated white blood cell count between 14 and 15. With that being said he does seem to be doing somewhat better now. His wound still is giving him some trouble nonetheless and he is obviously concerned about the fact likely talked about that this does seem to go more deeply than previously noted. I did review his wound culture which showed evidence of Staphylococcus aureus him and group B strep. Nonetheless he is on antibiotics, Levaquin, for this. Subsequently I did review his intake summary from the hospital as well. I also did look at the CT of the lumbar spine with contrast that was performed which showed no bone destruction to suggest lumbar disguises/osteomyelitis or sacral osteomyelitis. There was no paraspinal abscess. Nonetheless it  appears this may have  been more of just a soft tissue infection at this point which is good news. He still is nonetheless concerned about the wound which again I think is completely reasonable considering everything he's been through recently. 05/31/18 on evaluation today on evaluation today patient actually appears to be showing signs of his wound be a little bit deeper than what I would like to see. Fortunately he does not show any signs of significant infection although his temperature was 99 today he states he's been checking this at home and has not been elevated. Nonetheless with the undermining that I'm seeing at this point I am becoming more concerned about the wound I do think that offloading is a key factor here that is preventing the speedy recovery at this point. There does not appear to be any evidence of again over infection noted. He's been using Santyl currently. SHANDON, LEYS ([id number]) 06/07/18 the patient presents today for follow-up evaluation regarding the left ulcer in the gluteal region. He has been tolerating the Wound VAC fairly well. He is obviously very frustrated with this he states that to mean is really getting in his way. There does not appear to be any evidence of infection at this time he does have a little bit of odor I do not necessarily associate this with infection just something that we sometimes notice with Wound VAC therapy. With that being said I can definitely catch a tone of discontentment overall in the patient's demeanor today. This when he was previously in the hospital an CT scan was done of the lumbar region which did not reveal any signs of osteomyelitis. With that being said the pelvis in particular was not evaluated distinctly which means he could still have some osteonecrosis I. Nonetheless the Wound VAC was started on Thursday I do want to get this little bit more time before jumping to a CT scan of the pelvis although that is something that I might would  recommend if were not see an improvement by that time. 06/14/18 on evaluation today patient actually appears to be doing about the same in regard to his right gluteal ulcer. Again he did have a CT scan of the lumbar spine unfortunately this did not include the pelvis. Nonetheless with the depth of the wound that I'm seeing today even despite the fact that I'm not seeing any evidence of overt cellulitis I believe there's a good chance that we may be dealing with osteomyelitis somewhere in the right Ischial region. No fevers, chills, nausea, or vomiting noted at this time. 06/21/18 on evaluation today patient actually appears to be doing about the same with regard to his wound. The tunnel at 6 o'clock really does not appear to be any deeper although it is a little bit wider. I think at this point you may want to start packing this with white phone. Unfortunately I have not got approval for the CT scan of the pelvis as of yet due to the fact that Medicare apparently has been denied it due to the diagnosis codes not being appropriate according to Medicare for the test requested. With that being said the patient cannot have an MRI and therefore this is the only option that we have as far as testing is concerned. The patient has had infection and was on antibiotics and been added code for cellulitis of the bottom to see if this will be appropriate for getting the test approved. Nonetheless I'm concerned about the infection have been  spread deeper into the Ischial region. 06/28/18 on evaluation today patient actually appears to be doing rather well all things considered in regard to the right gluteal ulcer. He has been tolerating the dressing changes without complication. With that being said the Wound VAC he states does have to be replaced almost every day or at least reinforced unfortunately. Patient actually has his CT scan later this morning we should have the results by tomorrow. 07/05/18 on evaluation  today patient presents for follow-up concerning his right Ischial ulcer. He did see the surgeon Dr. Lysle Pearl last week. They were actually very happy with him and felt like he spent a tremendous amount of time with them as far as discussing his situation was concerned. In the end Dr. Lysle Pearl did contact me as well and determine that he would not recommend any surgical intervention at this point as he felt like it would not be in the patient's best interest based on what he was seeing. He recommended a referral to infectious disease. Subsequently this is something that Dr. Ines Bloomer office is working on setting up for the patient. As far as evaluation today is concerned the patient's wound actually appears to be worse at this point. I am concerned about how things are progressing and specifically about infection. I do not feel like it's the deeper but the area of depth is definitely widening which does have me concerned. No fevers, chills, nausea, or vomiting noted at this time. I think that we do need initiate antibiotic therapy the patient has an allow allergy to amoxicillin/penicillin he states that he gets a rash since childhood. Nonetheless she's never had the issues with Catholics or cephalosporins in general but he is aware of. 07/27/18 on evaluation today patient presents following admission to the hospital on 07/09/18. He was subsequently discharged on 07/20/18. On 07/15/18 the patient underwent irrigation and debridement was soft tissue biopsy and bone biopsy as well as placement of a Wound VAC in the OR by Dr. Celine Ahr. During the hospital course the patient was placed on a Wound VAC and recommended follow up with surgery in three weeks actually with Dr. Delaine Lame who is infectious disease. The patient was on vancomycin during the hospital course. 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 He also had a bone culture which revealed evidence of methicillin-resistant  staph aureus. He is updated CT scan 07/09/18 reveals that he had progression of the which was performed on wound to breakdown down to the trochanter where he actually had irregularities there as well suggestive of osteomyelitis. This was a change just since 9 December when we last performed a CT scan. Obviously this one had gone downhill quite significantly and rapidly. At this point upon evaluation I feel like in general the patient's wound seems to be doing fairly well all things considered upon my evaluation today. Obviously this is larger and deeper than what I previously evaluated but at the same time he seems to be making some progress as far as the appearance of the granulation tissue is concerned. I'm happy in that regard. No fevers, chills, nausea, or vomiting noted at this time. He is on IV vancomycin and Rocephin at the facility. He is currently in [facility]. 08/03/18 upon evaluation today patient's wound appears to be doing better in regard to the overall appearance at this point in time. Fortunately he's been tolerating the Wound VAC without complication and states that the facility has been taking ALBARA, [name]. ([id number])  excellent care of the wound site. Overall I see some Slough noted on the surface which I am going to attempt sharp debridement today of but nonetheless other than this I feel like he's making progress. 08/09/18 on evaluation today patient's wound appears to be doing much better compared to even last week's evaluation. Do believe that the Wound VAC is been of great benefit for him. He has been tolerating the dressing changes that is the Wound VAC without any complication and he has excellent granulation noted currently. There is no need for sharp debridement at this point. 08/16/18 on evaluation today patient actually appears to be doing very well in regard to the wound in the right gluteal fold region. This is showing signs of progress and again  appears to be very healthy which is excellent news. Fortunately there is no sign of active infection by way of odor or drainage at this point. Overall I'm very pleased with how things stand. He seems to be tolerating the Wound VAC without complication. 08/23/18 on evaluation today patient actually appears to be doing better in regard to his wound. He has been tolerating the Wound VAC without complication and in fact it has been collecting a significant amount of drainage which I think is good news especially considering how the wound appears. Fortunately there is no signs of infection at this time definitely nothing appears to be worse which is good news. He has not been started on the Bactrim and Flagyl that was recommended by Dr. Delaine Lame yet. I did actually contact her office this morning in order to check and see were things are that regard their gonna be calling me back. 08/30/18 on evaluation today patient actually appears to show signs of excellent improvement today compared to last evaluation. The undermining is getting much better the wound seems to be feeling quite nicely and I'm very pleased that the granulation in general. With that being said overall I feel like the patient has made excellent progress which is great news. No fevers, chills, nausea, or vomiting noted at this time. 09/06/18 on evaluation today patient actually appears to be doing rather well in regard to his right gluteal ulcer. This is showing signs of improvement in overall I'm very pleased with how things seem to be progressing. The patient likewise is please. Overall I see no evidence of infection he is about to complete his oral antibiotic regimen which is the end of the antibiotics for him in just about three days. 09/13/18 on evaluation today patient's right Ischial ulcer appears to be showing signs of continued improvement which is excellent news. He's been tolerating the dressing changes without complication.  Fortunately there's no signs of infection and the wound that seems to be doing very well. 09/28/18 on evaluation today patient appears to be doing rather well in regard to his right Ischial ulcer. He's been tolerating the Wound VAC without complication he knows there's much less drainage than there used to be this obviously is not a bad thing in my pinion. There's no evidence of infection despite the fact is but nothing about it now for several weeks. 10/04/18 on evaluation today patient appears to be doing better in regard to his right Ischial wound. He has been tolerating the Wound VAC without complication and I do believe that the silver nitrate last week was beneficial for him. Fortunately overall there's no evidence of active infection at this time which is great news. No fevers, chills, nausea, or vomiting noted at this  time. 10/11/18 on evaluation today patient actually appears to be doing rather well in regard to his Ischial ulcer. He's been tolerating the Wound VAC still without complication I feel like this is doing a good job. No fevers, chills, nausea, or vomiting noted at this time. 11/01/18 on evaluation today patient presents after having not been seen in our clinic for several weeks secondary to the fact that he was on evaluation today patient presents after having not been seen in our clinic for several weeks secondary to the fact that he was in a skilled nursing facility which was on lockdown currently due to the covert 19 national emergency. Subsequently he was discharged from the facility on this past Friday and subsequently made an appointment to come in to see yesterday. Fortunately there's no signs of active infection at this time which is good news and overall he does seem to have made progress since I last saw. Overall I feel like things are progressing quite nicely. The patient is having no pain. 11/08/18 on evaluation today patient appears to be doing okay in regard to his  right gluteal ulcer. He has been utilizing a Wound VAC home health this changing this at this point since he's home from the skilled nursing facility. Fortunately there's no signs of obvious active infection at this time. Unfortunately though there's no obvious active infection he is having some maceration and his wife states that when the sheets of the Wound VAC office on Sunday when it broke seal that he ended up having significant issues with some smell as well there concerned about the possibility of infection. Fortunately there's No fevers, chills, nausea, or vomiting noted at this time. NATAVION, DUNNETT ([id number]) 11/15/18 on evaluation today patient actually appears to be doing well in regard to his right gluteal ulcer. He has been tolerating the dressing changes without complication. Specifically the Wound VAC has been utilized up to this point. Fortunately there's no signs of infection and overall I feel like he has made progress even since last week when I last saw him. I'm actually fairly happy with the overall appearance although he does seem to have somewhat of a hyper granular overgrowth in the central portion of the wound which I think may require some sharp debridement to try flatness out possibly utilizing chemical cauterization following. 11/23/18 on evaluation today patient actually appears to be doing very well in regard to his sacral ulcer. He seems to be showing signs of improvement with good granulation. With that being said he still has the small area of hyper granulation right in the central portion of the wound which I'm gonna likely utilize silver nitrate on today. Subsequently he also keeps having a leak at the 6 o'clock location which is unfortunate we may be able to help out with some suggestions to try to prevent this going forward. Fortunately there's no signs of active infection at this time. 11/29/18 on evaluation today patient actually appears to be doing quite  well in regard to his pressure ulcer in the right gluteal fold region. He's been tolerating the dressing changes without complication. Fortunately there's no signs of active infection at this time. I've been rather pleased with how things have progressed there still some evidence of pressure getting to the area with some redness right around the immediate wound opening. Nonetheless other than this I'm not seeing any significant complications or issues the wound is somewhat hyper granular. Upon discussing with the patient and his wife today I'm not  sure that the wound is being packed to the base with the foam at this point. And if it's not been packed fully that may be part of the reason why is not seen as much improvement as far as the granulation from the base out. Again we do not want pack too tightly but we need some of the firm to get to the base of the wound. I discussed this with patient and his wife today. 12/06/18 on evaluation today patient appears to be doing well in regard to his right gluteal pressure ulcer. He's been tolerating the dressing changes without complication. Fortunately there's no signs of active infection. He still has some hyper granular tissue and I do think it would be appropriate to continue with the chemical cauterization as of today. 12/16/18 on evaluation today patient actually appears to be doing okay in regard to his right gluteal ulcer. He is been tolerating the dressing changes without complication including the Wound VAC. Overall I feel like nothing seems to be worsening I do feel like that the hyper granulation buds in the central portion of the wound have improved to some degree with the silver nitrate. We will have to see how things continue to progress. 12/20/18 on evaluation today patient actually appears to be doing much worse in my pinion even compared to last week's evaluation. Unfortunately as opposed to showing any signs of improvement the areas of hyper  granular tissue in the central portion of the wound seem to be getting worse. Subsequently the wound bed itself also seems to be getting deeper even compared to last week which is both unusual as well as concerning since prior he had been shown signs of improvement. Nonetheless I think that the issue could be that he's actually having some difficulty in issues with a deeper infection. There's no external signs of infection but nonetheless I am more worried about the internal, osteomyelitis, that could be restarting. He has not been on antibiotics for some time at this point. I think that it may be a good idea to go ahead and started back on an antibiotic therapy while we wait to see what the testing shows. 12/27/18 on evaluation today patient presents for follow-up concerning his left gluteal fold wound. Fortunately he appears to be doing well today. I did review the CT scan which was negative for any signs of osteomyelitis or acute abnormality this is excellent news. Overall I feel like the surface of the wound bed appears to be doing significantly better today compared to previously noted findings. There does not appear any signs of infection nor does he have any pain at this time. 01/03/19 on evaluation today patient actually appears to be doing quite well in regard to his ulcer. Post debridement last week he really did not have too much bleeding which is good news. Fortunately today this seems to be doing some better but we still has some of the hyper granular tissue noted in the base of the wound which is gonna require sharp debridement today as well. Overall I'm pleased with how things seem to be progressing since we switched away from the Wound VAC I think he is making some progress. 01/10/19 on evaluation today patient appears to be doing better in regard to his right gluteal fold ulcer. He has been tolerating the dressing changes without complication. The debridement to seem to be helping with  current away some of the poor hyper granular tissue bugs throughout the region of his gluteal fold  wound. He's been tolerating the dressing changes otherwise without complication which is great news. No fevers, chills, nausea, or vomiting noted at this time. 01/17/19 on evaluation today patient actually appears to be doing excellent in regard to his wound. He's been tolerating the dressing changes without complication. Fortunately there is no signs of active infection at this time which is great news. No fevers, chills, nausea, or vomiting noted at this time. DAMARKO, MAIO ([id number]) 01/24/19 on evaluation today patient actually appears to be doing quite well with regard to his ulcer. He has been tolerating the dressing changes without complication. Fortunately there's no signs of active infection at this time. Overall been very pleased with the progress that he seems to be making currently. 01/31/19-Patient returns at 1 week with apparent similarity in dimensions to the wound, with no signs of infection, he has been changing dressings twice a day 02/08/19 upon evaluation today patient actually appears to be doing well with regard to his right Ischial ulcer. The wound is not appear to be quite as deep and seems to be making progress which is good news. With that being said I'm still reluctant to go back to the Wound VAC at this point. He's been having to change the dressings twice a day which is a little bit much in my pinion from the wound care supplies standpoint. I think that possibly attempting to utilize extras orbit may be beneficial this may also help to prevent any additional breakdown secondary to fluid retention in the wound itself. The patient is in agreement with giving this a try. 02/15/19 on evaluation today patient actually appears to be doing decently well with regard to his ulcer in the right to gluteal fold location. He's been tolerating the dressing changes without  complication. Fortunately there is no signs of active infection at this time. He is able to keep the current dressing in place more effectively for a day at a time whereas before he was having a changes to to three times a day. The actions or has been helpful in this regard. Fortunately there's no signs of anything getting worse and I do feel like he showing signs of good improvement with regard to the wound bed status. 02/22/2019 patient appears to be doing very well today with regard to his ulcer in the gluteal fold. Fortunately there is no signs of active infection and he has been tolerating the dressing changes without any complication. Overall extremely pleased with how things seem to be progressing. He has much less of the hyper granular projections within the wound these have slowly been debrided away and he seems to be doing well. The wound bed is more uniform. 03/01/19 on evaluation today patient appears to be doing unfortunately about the same in regard to his gluteal ulcer. He's been tolerating the dressing changes without complication. Fortunately there's no signs of active infection at this time. With that being said he continues to develop these hyper granular projections which I'm unsure of exactly what they are and why they are rising. Nonetheless I explained to the patient that I do believe it would be a good idea for [place] to stand a biopsy sample for pathology to see if that can shed any light on what exactly may be going on here. Fortunately I do not see any obvious signs of infection. With that being said the patient has had a little bit more drainage this week apparently compared to last week. 03/08/2019 on evaluation today patient actually appears  to look somewhat better with regard to the appearance of his wound bed at this time. This is good news. Overall I am very pleased with how things seem to have progressed just in the past week with a switch to the [facility] dressing. I  think that has been beneficial for him. With that being said at this time the patient is concerned about his biopsy that I sent off last week unfortunately I do not have that report as of yet. Nonetheless we have called to obtain this and hopefully will hear back from the lab later this morning. 03/15/19 on evaluation today patient's wound actually appears to be doing okay today with regard to the overall appearance of the wound bed. He has been tolerating the dressing changes without complication he still has hyper granular tissue noted but fortunately that seems to be minimal at this point compared to some of what we've seen in the past. Nonetheless I do think that he is still having some issues currently with some of this type of granulation the biopsy and since all showed nothing more than just evidence of granulation tissue. Therefore there really is nothing different to initiate or do at this point. 03/24/19 on evaluation today patient appears to be doing a little better with regard to his ulcer. He's been tolerating the dressing changes without complication. Fortunately there is no signs of active infection at this time. No fevers, chills, nausea, or vomiting noted at this time. I'm overall pleased with how things seem to be progressing. 03/29/2019 on evaluation today patient appears to be doing about the same in regard to his ulcer in the right gluteal fold. Unfortunately he is not seeming to make a lot of progress and the wound is somewhat stalled. There is no signs of active infection externally but I am concerned about the possibility of infection continuing in the ischial location which previously he did have infection noted. Again is not able to have an MRI so probably our best option for testing for this would be a triple phase bone scan which will detect subtle changes in the bone more so than plain film x-rays. In the past they really have not been beneficial and in fact the CT scans even  have been somewhat questionable at times. Nonetheless there is no signs of systemic infection which is at least good news but again his wound is not healing at all on the predicted schedule. Patient History Information obtained from Patient. ANGELES, KOELLNER ([id number]) Family History Hypertension - Father, Stroke - Mother, No family history of Cancer, Diabetes, Heart Disease, Kidney Disease, Lung Disease, Seizures, Thyroid Problems, Tuberculosis. Social History Never smoker, Marital Status - Married, Alcohol Use - Never, Drug Use - No History, Caffeine Use - Daily. Medical History Eyes Patient has history of Cataracts - both removed Denies history of Glaucoma, Optic Neuritis Ear/Nose/Mouth/Throat Denies history of Chronic sinus problems/congestion, Middle ear problems Hematologic/Lymphatic Denies history of Anemia, Hemophilia, Human Immunodeficiency Virus, Lymphedema Respiratory Denies history of Aspiration, Asthma, Chronic Obstructive Pulmonary Disease (COPD), Pneumothorax, Sleep Apnea, Tuberculosis Cardiovascular Patient has history of Hypertension - takes medication Denies history of Angina, Arrhythmia, Congestive Heart Failure, Coronary Artery Disease, Deep Vein Thrombosis, Hypotension, Myocardial Infarction, Peripheral Arterial Disease, Peripheral Venous Disease, Phlebitis, Vasculitis Gastrointestinal Denies history of Cirrhosis , Colitis, Crohn s, Hepatitis A, Hepatitis B, Hepatitis C Endocrine Denies history of Type I Diabetes, Type II Diabetes Genitourinary Denies history of End Stage Renal Disease Immunological Denies history of Lupus Erythematosus, Raynaud  s, Scleroderma Integumentary (Skin) Denies history of History of Burn, History of pressure wounds Musculoskeletal Denies history of Gout, Rheumatoid Arthritis, Osteoarthritis, Osteomyelitis Neurologic Patient has history of Paraplegia - waist down Denies history of Dementia, Neuropathy, Quadriplegia, Seizure  Disorder Oncologic Denies history of Received Chemotherapy, Received Radiation Psychiatric Denies history of Anorexia/bulimia, Confinement Anxiety Medical And Surgical History Notes Oncologic Prostate cancer- currently treated with horomone therapy Review of Systems (ROS) Constitutional Symptoms (General Health) Denies complaints or symptoms of Fatigue, Fever, Chills, Marked Weight Change. Respiratory Denies complaints or symptoms of Chronic or frequent coughs, Shortness of Breath. Cardiovascular Denies complaints or symptoms of Chest pain, LE edema. Psychiatric Denies complaints or symptoms of Anxiety, Claustrophobia. NAVIAN, AGUILA ([id number]) Objective Constitutional Well-nourished and well-hydrated in no acute distress. Vitals Time Taken: 8:11 AM, Height: 73 in, Weight: 210 lbs, BMI: 27.7, Temperature: 98.8 F, Pulse: 68 bpm, Respiratory Rate: 16 breaths/min, Blood Pressure: 125/65 mmHg. Respiratory normal breathing without difficulty. clear to auscultation bilaterally. Cardiovascular regular rate and rhythm with normal S1, S2. Psychiatric this patient is able to make decisions and demonstrates good insight into disease process. Alert and Oriented x 3. pleasant and cooperative. General Notes: Patient's wound bed currently showed signs of some granulation although he did have increased evidence of hyper granular budding which again is not necessarily good the tissue is very poor and again does not seem to be promoting overall healing of the wound bed unfortunately. This does have me more concerned about if there is underlying infection as well. Integumentary (Hair, Skin) Wound #1 status is Open. Original cause of wound was Pressure Injury. The wound is located on the Right Gluteal fold. The wound measures 3.5cm length x 2cm width x 3cm depth; 5.498cm^2 area and 16.493cm^3 volume. There is muscle and Fat Layer (Subcutaneous Tissue) Exposed exposed. There is no  undermining noted, however, there is tunneling at 12:00 with a maximum distance of 4cm. There is a large amount of serosanguineous drainage noted. The wound margin is epibole. There is medium (34-66%) red, pink, hyper - granulation within the wound bed. There is a medium (34-66%) amount of necrotic tissue within the wound bed including Adherent Slough. 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 ([id number]) Plan Wound Cleansing: Wound #1 Right Gluteal fold: Other: - Cleanse wound with Dakin's Solution Anesthetic (add to Medication List): Wound #1 Right Gluteal fold: Topical Lidocaine 4% cream applied to wound bed prior to debridement (In Clinic Only). Skin Barriers/Peri-Wound Care: Wound #1 Right Gluteal fold: Other: - Apply zinc oxide to peri-wound area. Primary Wound Dressing: Wound #1 Right Gluteal fold: Hydrafera Blue Ready Transfer Secondary Dressing: Wound #1 Right Gluteal fold: XtraSorb - Secure with Tegaderm Dressing Change Frequency: Wound #1 Right Gluteal fold: Change dressing every day. Follow-up Appointments: Wound #1 Right Gluteal fold: Return Appointment in 1 week. Off-Loading: Wound #1 Right Gluteal fold: Turn and reposition every 2 hours Radiology ordered were: Bone Scan 1. At this point again I would recommend we continue with the [facility] although I am concerned about the fact that the patient does not seem to be making a significant on her progress we have tried multiple dressings I am afraid that there may be something underlying here that I am missing. Potentially this could be related to an infection remaining in the bone. For that reason I would recommend a triple phase  bone scan to further evaluate this since he cannot have an MRI this week much  more sensitive than even a plain CT scan which in the past has not been extremely helpful for [place]. 2. I recommend he still try to keep off of the area as much as possible to prevent this from breaking down further and deteriorating further. 3. If this does not show signs of improvement and there is nothing we really see on the bone scan to indicate a more significant issue here than I would probably recommend referral to plastic surgery at [facility] in order to discuss the possibility of a flap. We will see patient back for reevaluation in 1 week here in the clinic. If anything worsens or changes patient will contact our office for additional recommendations. Electronic Signature(s) Signed: 03/29/2019 8:55:07 AM By: Worthy Keeler [name] Entered By: Worthy Keeler on 03/29/2019 08:55:06 AMES, STANFIELD ([id number]) -------------------------------------------------------------------------------- ROS/PFSH Details Patient Name: [name] Date of Service: 03/29/2019 8:00 AM Medical Record Number: [id number] Patient Account Number: [id number] Date of Birth/Sex: 10/13/1950 (68 y.o. M) Treating RN: Montey Hora Primary Care Provider: [name] Other Clinician: Referring Provider: [name] Treating Provider/Extender: [name], Adelee Hannula Weeks in Treatment: 67 Information Obtained From Patient Constitutional Symptoms (General Health) Complaints and Symptoms: Negative for: Fatigue; Fever; Chills; Marked Weight Change Respiratory Complaints and Symptoms: Negative for: Chronic or frequent coughs; Shortness of Breath Medical History: Negative for: Aspiration; Asthma; Chronic Obstructive Pulmonary Disease (COPD); Pneumothorax; Sleep Apnea; Tuberculosis Cardiovascular Complaints and Symptoms: Negative for: Chest pain; LE edema Medical History: Positive for: Hypertension - takes medication Negative for: Angina; Arrhythmia; Congestive Heart Failure; Coronary Artery Disease; Deep  Vein Thrombosis; Hypotension; Myocardial Infarction; Peripheral Arterial Disease; Peripheral Venous Disease; Phlebitis; Vasculitis Psychiatric Complaints and Symptoms: Negative for: Anxiety; Claustrophobia Medical History: Negative for: Anorexia/bulimia; Confinement Anxiety Eyes Medical History: Positive for: Cataracts - both removed Negative for: Glaucoma; Optic Neuritis Ear/Nose/Mouth/Throat Medical History: Negative for: Chronic sinus problems/congestion; Middle ear problems Hematologic/Lymphatic Medical History: Negative for: Anemia; Hemophilia; Human Immunodeficiency Virus; Lymphedema TAMEL, KEAR. ([id number]) Gastrointestinal Medical History: Negative for: Cirrhosis ; Colitis; Crohnos; Hepatitis A; Hepatitis B; Hepatitis C Endocrine Medical History: Negative for: Type I Diabetes; Type II Diabetes Genitourinary Medical History: Negative for: End Stage Renal Disease Immunological Medical History: Negative for: Lupus Erythematosus; Raynaudos; Scleroderma Integumentary (Skin) Medical History: Negative for: History of Burn; History of pressure wounds Musculoskeletal Medical History: Negative for: Gout; Rheumatoid Arthritis; Osteoarthritis; Osteomyelitis Neurologic Medical History: Positive for: Paraplegia - waist down Negative for: Dementia; Neuropathy; Quadriplegia; Seizure Disorder Oncologic Medical History: Negative for: Received Chemotherapy; Received Radiation Past Medical History Notes: Prostate cancer- currently treated with horomone therapy HBO Extended History Items Eyes: Cataracts Immunizations Pneumococcal Vaccine: Received Pneumococcal Vaccination: No Implantable Devices No devices added Family and Social History Cancer: No; Diabetes: No; Heart Disease: No; Hypertension: Yes - Father; Kidney Disease: No; Lung Disease: No; Seizures: No; Stroke: Yes - Mother; Thyroid Problems: No; Tuberculosis: No; Never smoker; Marital Status - Married;  Alcohol Use: Never; Drug Use: No History; Caffeine Use: Daily AARAF, PLAZA ([id number]) Physician Affirmation I have reviewed and agree with the above information. Electronic Signature(s) Signed: 03/29/2019 4:33:06 PM By: Montey Hora Signed: 03/29/2019 5:39:49 PM By: Worthy Keeler [name] Entered By: Worthy Keeler on 03/29/2019 08:53:45 AVONDRE, FLANARY ([id number]) -------------------------------------------------------------------------------- SuperBill Details Patient Name: [name] Date of Service: 03/29/2019 Medical Record Number: [id number] Patient Account Number: [id number] Date of Birth/Sex: 1950-10-13 (68 y.o. 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, sequela I10 Essential (primary) hypertension Facility Procedures CPT4 Code: [id number] Description: 99213 - WOUND CARE VISIT-LEV 3 EST PT Modifier: Quantity: 1 Physician Procedures CPT4 Code: [id number] Description: [id number] - WC PHYS LEVEL 4 - EST PT ICD-10 Diagnosis Description L89.314 Pressure ulcer of right buttock, stage 4 L03.317 Cellulitis of buttock G82.20 Paraplegia, unspecified S34.109S Unspecified injury to unspecified level of lumbar spinal cor Modifier: d, sequela Quantity: 1 Electronic Signature(s) Signed: 03/29/2019 8:55:24 AM By: Worthy Keeler [name] Entered By: Worthy Keeler on 03/29/2019 08:55:24

## 2019-04-05 ENCOUNTER — Other Ambulatory Visit
Admission: RE | Admit: 2019-04-05 | Discharge: 2019-04-05 | Disposition: A | Discharge status: Home / Self Care | Payer: Medicare Other | Source: Ambulatory Visit | Attending: Physician Assistant | Admitting: Physician Assistant

## 2019-04-05 ENCOUNTER — Encounter: Payer: Medicare Other | Admitting: Physician Assistant

## 2019-04-05 ENCOUNTER — Other Ambulatory Visit: Payer: Self-pay

## 2019-04-05 DIAGNOSIS — L03317 Cellulitis of buttock: Secondary | ICD-10-CM | POA: Diagnosis present

## 2019-04-05 DIAGNOSIS — L89314 Pressure ulcer of right buttock, stage 4: Secondary | ICD-10-CM | POA: Diagnosis not present

## 2019-04-05 NOTE — Progress Notes (Signed)
SHAYLAN, VOSE ([id number]) Visit Report for 04/05/2019 Arrival Information Details Patient Name: [name], [name] Date of Service: 04/05/2019 8:00 AM Medical Record Number: [id number] Patient Account Number: [id number] Date of Birth/Sex: May 04, 1951 (68 y.o. M) Treating RN: Montey Hora Primary Care Damel Querry: [name] Other Clinician: Referring Triton Heidrich: [name] Treating Michi Herrmann/Extender: [name], HOYT Weeks in Treatment: 75 Visit Information History Since Last Visit Added or deleted any medications: No Patient Arrived: Wheel Chair Any new allergies or adverse reactions: No Arrival Time: 08:05 Had a fall or experienced change in No Accompanied By: wife activities of daily living that may affect Transfer Assistance: None risk of falls: Patient Identification Verified: Yes Signs or symptoms of abuse/neglect since last visito No Secondary Verification Process Completed: Yes Hospitalized since last visit: No Patient Requires Transmission-Based No Implantable device outside of the clinic excluding No Precautions: cellular tissue based products placed in the center Patient Has Alerts: Yes since last visit: Patient Alerts: NOT Has Dressing in Place as Prescribed: Yes Diabetic Pain Present Now: Yes Electronic Signature(s) Signed: 04/05/2019 11:47:47 AM By: Lorine Bears RCP, RRT, CHT Entered By: Lorine Bears on 04/05/2019 08:05:47 QUINTARIUS, DRYSDALE ([id number]) -------------------------------------------------------------------------------- Encounter Discharge Information Details Patient Name: [name] Date of Service: 04/05/2019 8:00 AM Medical Record Number: [id number] Patient Account Number: [id number] Date of Birth/Sex: 1951-05-05 (68 y.o. M) Treating RN: Montey Hora Primary Care Sary Bogie: [name] Other Clinician: Referring Hrithik Boschee: [name] Treating Lucky Alverson/Extender: [name],  HOYT Weeks in Treatment: 40 Encounter Discharge Information Items Post Procedure Vitals Discharge Condition: Stable Temperature (F): 98.8 Ambulatory Status: Wheelchair Pulse (bpm): 76 Discharge Destination: Home Respiratory Rate (breaths/min): 16 Transportation: Private Auto Blood Pressure (mmHg): 140/82 Accompanied By: wife Schedule Follow-up Appointment: Yes Clinical Summary of Care: Electronic Signature(s) Signed: 04/05/2019 3:57:57 PM By: Montey Hora Entered By: Montey Hora on 04/05/2019 08:38:37 [name] ([id number]) -------------------------------------------------------------------------------- Lower Extremity Assessment Details Patient Name: [name] Date of Service: 04/05/2019 8:00 AM Medical Record Number: [id number] Patient Account Number: [id number] Date of Birth/Sex: 1950/08/15 (68 y.o. M) Treating RN: Army Melia Primary Care Ifeoluwa Bartz: [name] Other Clinician: Referring Coree Brame: [name] Treating Jonia Oakey/Extender: [name], HOYT Weeks in Treatment: 70 Electronic Signature(s) Signed: 04/05/2019 11:24:57 AM By: Army Melia Entered By: Army Melia on 04/05/2019 08:15:52 TIMONTHY, DEMARY ([id number]) -------------------------------------------------------------------------------- Multi Wound Chart Details Patient Name: [name] Date of Service: 04/05/2019 8:00 AM Medical Record Number: [id number] Patient Account Number: [id number] Date of Birth/Sex: 1951-02-23 (68 y.o. M) Treating RN: Montey Hora Primary Care Reakwon Barren: [name] Other Clinician: Referring Teela Narducci: [name] Treating Jawaan Adachi/Extender: [name], HOYT Weeks in Treatment: 70 Vital Signs Height(in): 73 Pulse(bpm): 76 Weight(lbs): 210 Blood Pressure(mmHg): 140/82 Body Mass Index(BMI): 28 Temperature(F): 98.8 Respiratory Rate 16 (breaths/min): Photos: [N/A:N/A] Wound Location: Right Gluteal fold N/A  N/A Wounding Event: Pressure Injury N/A N/A Primary Etiology: Pressure Ulcer N/A N/A Comorbid History: Cataracts, Hypertension, N/A N/A Paraplegia Date Acquired: 11/02/2017 N/A N/A Weeks of Treatment: 70 N/A N/A Wound Status: Open N/A N/A Measurements L x W x D 3.6x1.6x2.8 N/A N/A (cm) Area (cm) : 4.524 N/A N/A Volume (cm) : 12.667 N/A N/A % Reduction in Area: 55.00% N/A N/A % Reduction in Volume: -1160.40% N/A N/A Position 1 (o'clock): 12 Maximum Distance 1 (cm): 3.7 Tunneling: Yes N/A N/A Classification: Category/Stage IV N/A N/A Exudate Amount: Large N/A N/A Exudate Type: Serosanguineous N/A N/A Exudate Color: red, brown N/A N/A Wound Margin: Epibole N/A N/A Granulation Amount: Medium (34-66%) N/A N/A Granulation  Quality: Red, Pink, Hyper-granulation N/A N/A Necrotic Amount: Medium (34-66%) N/A N/A Exposed Structures: Fat Layer (Subcutaneous N/A N/A Tissue) Exposed: Yes Muscle: Yes Fascia: No YAHIR, PALLEY. ([id number]) Tendon: No Joint: No Bone: No Epithelialization: None N/A N/A Treatment Notes Electronic Signature(s) Signed: 04/05/2019 3:57:57 PM By: Montey Hora Entered By: Montey Hora on 04/05/2019 08:24:32 [name] ([id number]) -------------------------------------------------------------------------------- [place] Details Patient Name: [name] Date of Service: 04/05/2019 8:00 AM Medical Record Number: [id number] Patient Account Number: [id number] Date of Birth/Sex: Dec 25, 1950 (68 y.o. M) Treating RN: Montey Hora Primary Care Nickie Warwick: [name] Other Clinician: Referring Doyle Tegethoff: [name] Treating Rozann Holts/Extender: [name], HOYT Weeks in Treatment: 76 Active Inactive Orientation to the Wound Care Program Nursing Diagnoses: Knowledge deficit related to the wound healing center program Goals: Patient/caregiver will verbalize understanding of the [facility] Program Date  Initiated: 11/30/2017 Target Resolution Date: 12/21/2017 Goal Status: Active Interventions: Provide education on orientation to the wound center Notes: Pressure Nursing Diagnoses: Knowledge deficit related to management of pressures ulcers Goals: Patient/caregiver will verbalize understanding of pressure ulcer management Date Initiated: 05/17/2018 Target Resolution Date: 05/28/2018 Goal Status: Active Interventions: Assess: immobility, friction, shearing, incontinence upon admission and as needed Notes: Wound/Skin Impairment Nursing Diagnoses: Impaired tissue integrity Goals: Patient/caregiver will verbalize understanding of skin care regimen Date Initiated: 11/30/2017 Target Resolution Date: 12/21/2017 Goal Status: Active Ulcer/skin breakdown will have a volume reduction of 30% by week 4 Date Initiated: 11/30/2017 Target Resolution Date: 12/21/2017 Goal Status: Active JUSTINO, MASTERMAN ([id number]) Interventions: Assess patient/caregiver ability to obtain necessary supplies Assess patient/caregiver ability to perform ulcer/skin care regimen upon admission and as needed Assess ulceration(s) every visit Treatment Activities: Skin care regimen initiated : 11/30/2017 Notes: Electronic Signature(s) Signed: 04/05/2019 3:57:57 PM By: Montey Hora Entered By: Montey Hora on 04/05/2019 08:24:24 [name] ([id number]) -------------------------------------------------------------------------------- Pain Assessment Details Patient Name: [name] Date of Service: 04/05/2019 8:00 AM Medical Record Number: [id number] Patient Account Number: [id number] Date of Birth/Sex: Nov 06, 1950 (68 y.o. M) Treating RN: Montey Hora Primary Care Haifa Hatton: [name] Other Clinician: Referring Cylee Dattilo: [name] Treating Noelle Hoogland/Extender: [name], HOYT Weeks in Treatment: 97 Active Problems Location of Pain Severity and Description of Pain Patient Has Paino  Yes Site Locations Rate the pain. Current Pain Level: 4 Pain Management and Medication Current Pain Management: Electronic Signature(s) Signed: 04/05/2019 11:47:47 AM By: Lorine Bears RCP, RRT, CHT Signed: 04/05/2019 3:57:57 PM By: Montey Hora Entered By: Lorine Bears on 04/05/2019 08:08:52 DEMETROUS, GUILLETTE ([id number]) -------------------------------------------------------------------------------- Patient/Caregiver Education Details Patient Name: [name] Date of Service: 04/05/2019 8:00 AM Medical Record Number: [id number] Patient Account Number: [id number] Date of Birth/Gender: [birth year] (68 y.o. M) Treating RN: Montey Hora Primary Care Physician: [name] Other Clinician: Referring Physician: [name] Treating Physician/Extender: Sharalyn Ink in Treatment: 29 Education Assessment Education Provided To: Patient and Caregiver Education Topics Provided Wound/Skin Impairment: Handouts: Other: wound care as ordered Methods: Demonstration, Explain/Verbal Responses: State content correctly Electronic Signature(s) Signed: 04/05/2019 3:57:57 PM By: Montey Hora Entered By: Montey Hora on 04/05/2019 08:36:50 Asberry, [name] ([id number]) -------------------------------------------------------------------------------- Wound Assessment Details Patient Name: [name] Date of Service: 04/05/2019 8:00 AM Medical Record Number: [id number] Patient Account Number: [id number] Date of Birth/Sex: 10-Oct-1950 (68 y.o. M) Treating RN: Army Melia Primary Care Aidenn Skellenger: [name] Other Clinician: Referring Deray Dawes: [name] Treating Perry Molla/Extender: [name], HOYT Weeks in Treatment: 70 Wound Status Wound Number: 1 Primary Etiology: Pressure Ulcer Wound Location: Right Gluteal  fold Wound Status: Open Wounding Event: Pressure Injury Comorbid History: Cataracts, Hypertension,  Paraplegia Date Acquired: 11/02/2017 Weeks Of Treatment: 70 Clustered Wound: No Photos Wound Measurements Length: (cm) 3.6 Width: (cm) 1.6 Depth: (cm) 2.8 Area: (cm) 4.524 Volume: (cm) 12.667 % Reduction in Area: 55% % Reduction in Volume: -1160.4% Epithelialization: None Tunneling: Yes Position (o'clock): 12 Maximum Distance: (cm) 3.7 Wound Description Classification: Category/Stage IV Wound Margin: Epibole Exudate Amount: Large Exudate Type: Serosanguineous Exudate Color: red, brown Foul Odor After Cleansing: No Slough/Fibrino Yes Wound Bed Granulation Amount: Medium (34-66%) Exposed Structure Granulation Quality: Red, Pink, Hyper-granulation Fascia Exposed: No Necrotic Amount: Medium (34-66%) Fat Layer (Subcutaneous Tissue) Exposed: Yes Necrotic Quality: Adherent Slough Tendon Exposed: No Muscle Exposed: Yes Necrosis of Muscle: No Joint Exposed: No Bone Exposed: No Tangney, Gyasi J. ([id number]) Treatment Notes Wound #1 (Right Gluteal fold) Notes zinc to peri wound, Hydrafera Blue, xtrasorb and tegaderm Electronic Signature(s) Signed: 04/05/2019 11:24:57 AM By: Army Melia Entered By: Army Melia on 04/05/2019 08:15:01 [name] ([id number]) -------------------------------------------------------------------------------- Vitals Details Patient Name: [name] Date of Service: 04/05/2019 8:00 AM Medical Record Number: [id number] Patient Account Number: [id number] Date of Birth/Sex: 11/07/1950 (68 y.o. M) Treating RN: Montey Hora Primary Care Lamari Beckles: [name] Other Clinician: Referring Daisean Brodhead: [name] Treating Faust Thorington/Extender: [name], HOYT Weeks in Treatment: 70 Vital Signs Time Taken: 08:05 Temperature (F): 98.8 Height (in): 73 Pulse (bpm): 76 Weight (lbs): 210 Respiratory Rate (breaths/min): 16 Body Mass Index (BMI): 27.7 Blood Pressure (mmHg): 140/82 Reference Range: 80 - 120 mg /  dl Electronic Signature(s) Signed: 04/05/2019 11:47:47 AM By: Lorine Bears RCP, RRT, CHT Entered By: Becky Sax, Amado Nash on 04/05/2019 08:09:18

## 2019-04-05 NOTE — Progress Notes (Addendum)
KENDON, BOOZER ([id number]) Visit Report for 04/05/2019 Chief Complaint Document Details Patient Name: [name], [name] Date of Service: 04/05/2019 8:00 AM Medical Record Number: [id number] Patient Account Number: [id number] Date of Birth/Sex: [birth year] (68 y.o. M) Treating RN: Montey Hora Primary Care Provider: [name] Other Clinician: Referring Provider: [name] Treating Provider/Extender: [name], HOYT Weeks in Treatment: 43 Information Obtained from: Patient Chief Complaint Right gluteal fold ulcer Electronic Signature(s) Signed: 04/05/2019 8:11:54 AM By: Worthy Keeler [name] Entered By: Worthy Keeler on 04/05/2019 08:11:54 [name], [name] ([id number]) -------------------------------------------------------------------------------- Debridement Details Patient Name: [name] Date of Service: 04/05/2019 8:00 AM Medical Record Number: [id number] Patient Account Number: [id number] Date of Birth/Sex: May 14, 1951 (68 y.o. M) Treating RN: Montey Hora Primary Care Provider: [name] Other Clinician: Referring Provider: [name] Treating Provider/Extender: [name], HOYT Weeks in Treatment: 70 Debridement Performed for Wound #1 Right Gluteal fold Assessment: Performed By: Physician [name], HOYT E., [name] Debridement Type: Debridement Level of Consciousness (Pre- Awake and Alert procedure): Pre-procedure Verification/Time Yes - 08:32 Out Taken: Start Time: 08:32 Pain Control: Lidocaine 4% Topical Solution Total Area Debrided (L [name] W): 3.6 (cm) [name] 1.6 (cm) = 5.76 (cm) Tissue and other material Viable, Non-Viable, Slough, Subcutaneous, Slough, Hyper-granulation debrided: Level: Skin/Subcutaneous Tissue Debridement Description: Excisional Instrument: Curette Specimen: Tissue Culture Number of Specimens Taken: 1 Bleeding: Moderate Hemostasis Achieved: Silver Nitrate End Time: 08:36 Procedural Pain: 0 Post  Procedural Pain: 0 Response to Treatment: Procedure was tolerated well Level of Consciousness Awake and Alert (Post-procedure): Post Debridement Measurements of Total Wound Length: (cm) 3.6 Stage: Category/Stage IV Width: (cm) 1.6 Depth: (cm) 3 Volume: (cm) 13.572 Character of Wound/Ulcer Post Improved Debridement: Post Procedure Diagnosis Same as Pre-procedure Notes 4 sticks of silver nitrate used Electronic Signature(s) Signed: 04/05/2019 3:57:57 PM By: Montey Hora Signed: 04/07/2019 2:05:35 AM By: Worthy Keeler [name] Entered By: Montey Hora on 04/05/2019 08:41:27 [name], [name] ([id number]) [name], [name] ([id number]) -------------------------------------------------------------------------------- HPI Details Patient Name: [name] Date of Service: 04/05/2019 8:00 AM Medical Record Number: [id number] Patient Account Number: [id number] Date of Birth/Sex: Aug 19, 1950 (68 y.o. 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 He states that there is no history of diabetes that he is aware of. He does have issues with his prostate and is currently receiving treatment for this by way of oral medication. With that being said I do not have a lot of details in that regard. Nonetheless the patient presents today as a result of having been referred to [place] by another provider initially home health was set to come out and take care of his wound although due to the fact that he apparently drives he's not able to receive home  health. His wife is therefore trying to help take care of this wound within although they have been struggling with what exactly to do at this point. She states that she can do some things but she is definitely not a nurse and does have some issues with looking at blood. The good news is the wound does not appear to be too deep  and is fairly superficial at this point. There is no slough noted there is some nonviable skin noted around the surface of the wound and the perimeter at this point. The central portion of the wound appears to be very good with a dermal layer noted this does not appear to be again deep enough to extend it to subcutaneous tissue at this point. 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 In the pump he has Dilaudid and baclofen. 12/07/17 on evaluation today patient presents for follow-up concerning his ongoing lower back thigh ulcer on the right. He states that he did not get the supplies ordered and therefore has not really been able to perform the dressing changes as directed exactly. His wife was able to get some Boarder Foam Dressing's from the drugstore and subsequently has been using hydrogel which did help to a degree in the wound does appear to be able smaller. There is actually more drainage this week noted than previous. 12/21/17 on evaluation today patient appears to be doing rather well in regard to his right gluteal ulcer. He has been tolerating the dressing changes without complication. There does not appear to be any evidence of infection at this point in time. Overall the wound does seem to be making some progress as far as the edges are concerned there's not as much in the way of overlapping of the external wound edges and he has a good epithelium to wound bed border for the most part. This however is not true right at the 12 o'clock location over the span of a  little over a centimeters which actually will require debridement today to clean this away and hopefully allow it to continue to heal more appropriately. 12/28/17 on evaluation today patient appears to be doing rather well in regard to his ulcer in the left gluteal region. He's been tolerating the dressing changes without complication. Apparently he has had some difficulty getting his dressing material. Apparently there's been some confusion with ordering we're gonna check into this. Nonetheless overall he's been showing signs of improvement which is good news. Debridement is not required today. 01/04/18 on evaluation today patient presents for follow-up concerning his right gluteal ulcer. He has been tolerating the dressing changes fairly well. On inspection today it appears he may actually have some maceration them concerned about the fact that he may be developing too much moisture in and around the wound bed which can cause delay in healing. With that being said he unfortunately really has not showed significant signs of improvement since last week's evaluation in fact this may even be just the little bit/slightly larger. Nonetheless he's been having a lot of discomfort I'm not sure this is even related to the wound as he has no pain when I'm to breeding or otherwise cleaning the wound during evaluation today. Nonetheless this is something that we did recommend he talked to his pain specialist concerning. 01/11/18 on evaluation today patient appears to be doing better in regard to his ulceration. He has been tolerating the dressing changes without complication. With that being said overall there's no evidence of infection which is good news. The only thing is he did receive the hatch affair blue classic versus the ready nonetheless I feel like this is perfectly fine and appears to have done well for him over the past week. 01/25/18 on evaluation today patient's wound actually appears to  be a little  bit larger than during the last evaluation. The good [name], [name]. ([id number]) news is the majority of the wound edges actually appear to be fairly firmly attached to the wound bed unfortunately again we're not really making progress in regard to the size. Roughly the wound is about the same size as when I first saw him although again the wound margin/edges appear to be much better. 02/01/18 on evaluation today patient actually appears to be doing very well in regard to his wound. Applying the Prisma dry does seem to be better although he does still have issues with slow progression of the wound. There was a slight improvement compared to last week's measurements today. Nonetheless I have been considering other options as far as the possibility of Theraskin or even a snap vac. In general I'm not sure that the Theraskin due to location of the wound would be a very good idea. Nonetheless I do think that a snap vac could be a possibility for the patient and in fact I think this could even be an excellent way to manage the wound possibly seeing some improvement in a very rapid fashion here. Nonetheless this is something that we would need to get approved and I did have a lengthy conversation with the patient about this today. 02/08/18 on evaluation today patient appears to be doing a little better in regard to his ulcer. He has been tolerating the dressing changes without complication. Fortunately despite the fact that the wound is a little bit smaller it's not significantly so unfortunately. We have discussed the possibility of a snap vac we did check with insurance this is actually covered at this point. Fortunately there does not appear to be any sign of infection. Overall I'm fairly pleased with how things seem to be appearing at this point. 02/15/18 on evaluation today patient appears to be doing rather well in regard to his right gluteal ulcer. Unfortunately the snap vac did not stay in place  with his sheer and friction this came loose and did not seem to maintain seal very well. He worked for about two days and it did seem to do very well during that time according to his wife but in general this does not seem to be something that's gonna be beneficial for him long-term. I do believe we need to go back to standard dressings to see if we can find something that will be of benefit. 03/02/18- He is here in follow up evaluation; there is minimal change in the wound. He will continue with the same treatment plan, would consider changing to iodosrob/iodoflex if ulcer continues to to plateau. He will follow up next week 03/08/18 on evaluation today patient's wound actually appears to be about the same size as when I previously saw him several weeks back. Unfortunately he does have some slightly dark discoloration in the central portion of the wound which has me concerned about pressure injury. I do believe he may be sitting for too long a period of time in fact he tells me that "I probably sit for much too long". He does have some Slough noted on the surface of the wound and again as far as the size of the wound is concerned I'm really not seeing anything that seems to have improved significantly. 03/15/18 on evaluation today patient appears to be doing fairly well in regard to his ulcer. The wound measured pretty much about the same today compared to last week's evaluation when looking at  his graph. With that being said the area of bruising/deep tissue injury that was noted last week I do not see at this point. He did get a new cushion fortunately this does seem to be have been of benefit in my pinion. It does appear that he's been off of this more which is good news as well I think that is definitely showing in the overall wound measurements. With that being said I do believe that he needs to continue to offload I don't think that the fact this is doing better should be or is going to allow him  to not have to offload and explain this to him as well. Overall he seems to be in agreement the plan I think he understands. The overall appearance of the wound bed is improved compared to last week I think the Iodoflex has been beneficial in that regard. 03/29/18 on evaluation today patient actually appears to be doing rather well in regard to his wound from the overall appearance standpoint he does have some granulation although there's some Slough on the surface of the wound noted as well. With that being said he unfortunately has not improved in regard to the overall measurement of the wound in volume or in size. I did have a discussion with him very specifically about offloading today. He actually does work although he mainly is just sitting throughout the day. He tells me he offloads by "lifting himself up for 30 seconds off of his chair occasionally" purchase from [employer] which does seem to have helped. And he has a new cushion that he with that being said he's also able to stand some for a very short period of time but not significant enough I think to provide appropriate offloading. I think the biggest issue at this point with the wound and the fact is not healing as quickly as we would like is due to the fact that he is really not able to appropriately offload while at work. He states the beginning after his injury he actually had a bed at his job that he could lay on in order to offload and that does seem to have been of help back at that time. Nonetheless he had not done this in quite some time unfortunately. I think that could be helpful for him this is something I would like for him to look into. 04/05/18 on evaluation today patient actually presents for follow-up concerning his right gluteal ulcer. Again he really is not significantly improved even compared to last week. He has been tolerating the dressing changes without complication. With that being said fortunately there  appears to be no evidence of infection at this time. He has been more proactive in trying to offload. SAJJAD, BILLETT ([id number]) 04/12/18 on evaluation today patient actually appears to be doing a little better in regard to his wound and the right gluteal fold region. He's been tolerating the dressing changes since removing the oasis without complication. However he was having a lot of burning initially with the oasis in place. He's unsure of exactly why this was given so much discomfort but he assumes that it was the oasis itself causing the problem. Nonetheless this had to be removed after about three days in place although even those three days seem to have made a fairly good improvement in regard to the overall appearance of the wound bed. In fact is the first time that he's made any improvement from the standpoint of measurements in about six  weeks. He continues to have no discomfort over the area of the wound itself which leads me to wonder why he was having the burning with the oasis when he does not even feel the actual debridement's themselves. I am somewhat perplexed by this. 04/19/18 on evaluation today patient's wound actually appears to be showing signs of epithelialization around the edge of the wound and in general actually appears to be doing better which is good news. He did have the same burning after about three days with applying the Endoform last week in the same fashion that I would generally apply a skin substitute. This seems to indicate that it's not the oasis to cause the problem but potentially the moisture buildup that just causes things to burn or there may be some other reaction with the skin prep or Steri-Strips. Nonetheless I'm not sure that is gonna be able to tolerate any skin substitute for a long period of time. The good news is the wound actually appears to be doing better today compared to last week and does seem to finally be making some progress. 04/26/18  on evaluation today patient actually appears to be doing rather well in regard to his ulcer in the right gluteal fold. He has been tolerating the dressing changes without complication which is good news. The Endoform does seem to be helping although he was a little bit more macerated this week. This seems to be an ongoing issue with fluid control at this point. Nonetheless I think we may be able to add something like Drawtex to help control the drainage. 05/03/18 on evaluation today patient appears to actually be doing better in regard to the overall appearance of his wound. He has been tolerating the dressing changes without complication. Fortunately there appears to be no evidence of infection at this time. I really feel like his wound has shown signs as of today of turning around last week I thought so as well and definitely he could be seen in this week's overall appearance and measurements. In general I'm very pleased with the fact that he finally seems to be making a steady but sure progress. The patient likewise is very pleased. 05/17/18 on evaluation today patient appears to be doing more poorly unfortunately in regard to his ulcer. He has been tolerating the dressing changes without complication. With that being said he tells me that in the past couple of days he and his wife have noticed that we did not seem to be doing quite as well is getting dark near the center. Subsequently upon evaluation today the wound actually does appear to be doing worse compared to previous. He has been tolerating the dressing changes otherwise and he states that he is not been sitting up anymore than he was in the past from what he tells me. Still he has continued to work he states "I'm tired of dealing with this and if I have to just go home and lay in the bed all the time that's what I'll do". Nonetheless I am concerned about the fact that this wound does appear to be deeper than what it was  previous. 05/24/18 upon evaluation today patient actually presents after having been in the hospital due to what was presumed to be sepsis secondary to the wound infection. He had an elevated white blood cell count between 14 and 15. With that being said he does seem to be doing somewhat better now. His wound still is giving him some trouble nonetheless and he is  obviously concerned about the fact likely talked about that this does seem to go more deeply than previously noted. I did review his wound culture which showed evidence of Staphylococcus aureus him and group B strep. Nonetheless he is on antibiotics, Levaquin, for this. Subsequently I did review his intake summary from the hospital as well. I also did look at the CT of the lumbar spine with contrast that was performed which showed no bone destruction to suggest lumbar disguises/osteomyelitis or sacral osteomyelitis. There was no paraspinal abscess. Nonetheless it appears this may have been more of just a soft tissue infection at this point which is good news. He still is nonetheless concerned about the wound which again I think is completely reasonable considering everything he's been through recently. 05/31/18 on evaluation today on evaluation today patient actually appears to be showing signs of his wound be a little bit deeper than what I would like to see. Fortunately he does not show any signs of significant infection although his temperature was 99 today he states he's been checking this at home and has not been elevated. Nonetheless with the undermining that I'm seeing at this point I am becoming more concerned about the wound I do think that offloading is a key factor here that is preventing the speedy recovery at this point. There does not appear to be any evidence of again over infection noted. He's been using Santyl currently. 06/07/18 the patient presents today for follow-up evaluation regarding the left ulcer in the gluteal  region. He has been tolerating the Wound VAC fairly well. He is obviously very frustrated with this he states that to mean is really getting in his way. There does not appear to be any evidence of infection at this time he does have a little bit of odor I do not necessarily associate this with infection just something that we sometimes notice with Wound VAC therapy. With that being said I can definitely catch a tone of discontentment overall in the patient's demeanor today. This when he was previously in the hospital [name], [name]. ([id number]) an CT scan was done of the lumbar region which did not reveal any signs of osteomyelitis. With that being said the pelvis in particular was not evaluated distinctly which means he could still have some osteonecrosis I. Nonetheless the Wound VAC was started on Thursday I do want to get this little bit more time before jumping to a CT scan of the pelvis although that is something that I might would recommend if were not see an improvement by that time. 06/14/18 on evaluation today patient actually appears to be doing about the same in regard to his right gluteal ulcer. Again he did have a CT scan of the lumbar spine unfortunately this did not include the pelvis. Nonetheless with the depth of the wound that I'm seeing today even despite the fact that I'm not seeing any evidence of overt cellulitis I believe there's a good chance that we may be dealing with osteomyelitis somewhere in the right Ischial region. No fevers, chills, nausea, or vomiting noted at this time. 06/21/18 on evaluation today patient actually appears to be doing about the same with regard to his wound. The tunnel at 6 o'clock really does not appear to be any deeper although it is a little bit wider. I think at this point you may want to start packing this with white phone. Unfortunately I have not got approval for the CT scan of the pelvis as of yet  due to the fact that Medicare  apparently has been denied it due to the diagnosis codes not being appropriate according to Medicare for the test requested. With that being said the patient cannot have an MRI and therefore this is the only option that we have as far as testing is concerned. The patient has had infection and was on antibiotics and been added code for cellulitis of the bottom to see if this will be appropriate for getting the test approved. Nonetheless I'm concerned about the infection have been spread deeper into the Ischial region. 06/28/18 on evaluation today patient actually appears to be doing rather well all things considered in regard to the right gluteal ulcer. He has been tolerating the dressing changes without complication. With that being said the Wound VAC he states does have to be replaced almost every day or at least reinforced unfortunately. Patient actually has his CT scan later this morning we should have the results by tomorrow. 07/05/18 on evaluation today patient presents for follow-up concerning his right Ischial ulcer. He did see the surgeon Dr. Lysle Pearl last week. They were actually very happy with him and felt like he spent a tremendous amount of time with them as far as discussing his situation was concerned. In the end Dr. Lysle Pearl did contact me as well and determine that he would not recommend any surgical intervention at this point as he felt like it would not be in the patient's best interest based on what he was seeing. He recommended a referral to infectious disease. Subsequently this is something that Dr. Ines Bloomer office is working on setting up for the patient. As far as evaluation today is concerned the patient's wound actually appears to be worse at this point. I am concerned about how things are progressing and specifically about infection. I do not feel like it's the deeper but the area of depth is definitely widening which does have me concerned. No fevers, chills, nausea, or  vomiting noted at this time. I think that we do need initiate antibiotic therapy the patient has an allow allergy to amoxicillin/penicillin he states that he gets a rash since childhood. Nonetheless she's never had the issues with [employer] or cephalosporins in general but he is aware of. 07/27/18 on evaluation today patient presents following admission to the hospital on 07/09/18. He was subsequently discharged on 07/20/18. On 07/15/18 the patient underwent irrigation and debridement was soft tissue biopsy and bone biopsy as well as placement of a Wound VAC in the OR by Dr. Celine Ahr. During the hospital course the patient was placed on a Wound VAC and recommended follow up with surgery in three weeks actually with Dr. Delaine Lame who is infectious disease. The patient was on vancomycin during the hospital course. 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 He also had a bone culture which revealed evidence of methicillin-resistant staph aureus. He is updated CT scan 07/09/18 reveals that he had progression of the which was performed on wound to breakdown down to the trochanter where he actually had irregularities there as well suggestive of osteomyelitis. This was a change just since 9 December when we last performed a CT scan. Obviously this one had gone downhill quite significantly and rapidly. At this point upon evaluation I feel like in general the patient's wound seems to be doing fairly well all things considered upon my evaluation today. Obviously this is larger and deeper than what I previously evaluated but at the same time he seems  to be making some progress as far as the appearance of the granulation tissue is concerned. I'm happy in that regard. No fevers, chills, nausea, or vomiting noted at this time. He is on IV vancomycin and Rocephin at the facility. He is currently in [facility]. 08/03/18 upon evaluation today patient's wound appears to be doing  better in regard to the overall appearance at this point in time. Fortunately he's been tolerating the Wound VAC without complication and states that the facility has been taking excellent care of the wound site. Overall I see some Slough noted on the surface which I am going to attempt sharp debridement today of but nonetheless other than this I feel like he's making progress. 08/09/18 on evaluation today patient's wound appears to be doing much better compared to even last week's evaluation. Do believe that the Wound VAC is been of great benefit for him. He has been tolerating the dressing changes that is the Wound JAVONI, [name]. ([id number]) VAC without any complication and he has excellent granulation noted currently. There is no need for sharp debridement at this point. 08/16/18 on evaluation today patient actually appears to be doing very well in regard to the wound in the right gluteal fold region. This is showing signs of progress and again appears to be very healthy which is excellent news. Fortunately there is no sign of active infection by way of odor or drainage at this point. Overall I'm very pleased with how things stand. He seems to be tolerating the Wound VAC without complication. 08/23/18 on evaluation today patient actually appears to be doing better in regard to his wound. He has been tolerating the Wound VAC without complication and in fact it has been collecting a significant amount of drainage which I think is good news especially considering how the wound appears. Fortunately there is no signs of infection at this time definitely nothing appears to be worse which is good news. He has not been started on the Bactrim and Flagyl that was recommended by Dr. Delaine Lame yet. I did actually contact her office this morning in order to check and see were things are that regard their gonna be calling me back. 08/30/18 on evaluation today patient actually appears to show signs of  excellent improvement today compared to last evaluation. The undermining is getting much better the wound seems to be feeling quite nicely and I'm very pleased that the granulation in general. With that being said overall I feel like the patient has made excellent progress which is great news. No fevers, chills, nausea, or vomiting noted at this time. 09/06/18 on evaluation today patient actually appears to be doing rather well in regard to his right gluteal ulcer. This is showing signs of improvement in overall I'm very pleased with how things seem to be progressing. The patient likewise is please. Overall I see no evidence of infection he is about to complete his oral antibiotic regimen which is the end of the antibiotics for him in just about three days. 09/13/18 on evaluation today patient's right Ischial ulcer appears to be showing signs of continued improvement which is excellent news. He's been tolerating the dressing changes without complication. Fortunately there's no signs of infection and the wound that seems to be doing very well. 09/28/18 on evaluation today patient appears to be doing rather well in regard to his right Ischial ulcer. He's been tolerating the Wound VAC without complication he knows there's much less drainage than there used to be this  obviously is not a bad thing in my pinion. There's no evidence of infection despite the fact is but nothing about it now for several weeks. 10/04/18 on evaluation today patient appears to be doing better in regard to his right Ischial wound. He has been tolerating the Wound VAC without complication and I do believe that the silver nitrate last week was beneficial for him. Fortunately overall there's no evidence of active infection at this time which is great news. No fevers, chills, nausea, or vomiting noted at this time. 10/11/18 on evaluation today patient actually appears to be doing rather well in regard to his Ischial ulcer. He's been  tolerating the Wound VAC still without complication I feel like this is doing a good job. No fevers, chills, nausea, or vomiting noted at this time. 11/01/18 on evaluation today patient presents after having not been seen in our clinic for several weeks secondary to the fact that he was on evaluation today patient presents after having not been seen in our clinic for several weeks secondary to the fact that he was in a skilled nursing facility which was on lockdown currently due to the covert 19 national emergency. Subsequently he was discharged from the facility on this past Friday and subsequently made an appointment to come in to see yesterday. Fortunately there's no signs of active infection at this time which is good news and overall he does seem to have made progress since I last saw. Overall I feel like things are progressing quite nicely. The patient is having no pain. 11/08/18 on evaluation today patient appears to be doing okay in regard to his right gluteal ulcer. He has been utilizing a Wound VAC home health this changing this at this point since he's home from the skilled nursing facility. Fortunately there's no signs of obvious active infection at this time. Unfortunately though there's no obvious active infection he is having some maceration and his wife states that when the sheets of the Wound VAC office on Sunday when it broke seal that he ended up having significant issues with some smell as well there concerned about the possibility of infection. Fortunately there's No fevers, chills, nausea, or vomiting noted at this time. 11/15/18 on evaluation today patient actually appears to be doing well in regard to his right gluteal ulcer. He has been tolerating the dressing changes without complication. Specifically the Wound VAC has been utilized up to this point. Fortunately there's no signs of infection and overall I feel like he has made progress even since last week when I last  saw him. I'm actually fairly happy with the overall appearance although he does seem to have somewhat of a hyper granular Hanover, Jeron J. ([id number]) overgrowth in the central portion of the wound which I think may require some sharp debridement to try flatness out possibly utilizing chemical cauterization following. 11/23/18 on evaluation today patient actually appears to be doing very well in regard to his sacral ulcer. He seems to be showing signs of improvement with good granulation. With that being said he still has the small area of hyper granulation right in the central portion of the wound which I'm gonna likely utilize silver nitrate on today. Subsequently he also keeps having a leak at the 6 o'clock location which is unfortunate we may be able to help out with some suggestions to try to prevent this going forward. Fortunately there's no signs of active infection at this time. 11/29/18 on evaluation today patient actually appears to be  doing quite well in regard to his pressure ulcer in the right gluteal fold region. He's been tolerating the dressing changes without complication. Fortunately there's no signs of active infection at this time. I've been rather pleased with how things have progressed there still some evidence of pressure getting to the area with some redness right around the immediate wound opening. Nonetheless other than this I'm not seeing any significant complications or issues the wound is somewhat hyper granular. Upon discussing with the patient and his wife today I'm not sure that the wound is being packed to the base with the foam at this point. And if it's not been packed fully that may be part of the reason why is not seen as much improvement as far as the granulation from the base out. Again we do not want pack too tightly but we need some of the firm to get to the base of the wound. I discussed this with patient and his wife today. 12/06/18 on evaluation today  patient appears to be doing well in regard to his right gluteal pressure ulcer. He's been tolerating the dressing changes without complication. Fortunately there's no signs of active infection. He still has some hyper granular tissue and I do think it would be appropriate to continue with the chemical cauterization as of today. 12/16/18 on evaluation today patient actually appears to be doing okay in regard to his right gluteal ulcer. He is been tolerating the dressing changes without complication including the Wound VAC. Overall I feel like nothing seems to be worsening I do feel like that the hyper granulation buds in the central portion of the wound have improved to some degree with the silver nitrate. We will have to see how things continue to progress. 12/20/18 on evaluation today patient actually appears to be doing much worse in my pinion even compared to last week's evaluation. Unfortunately as opposed to showing any signs of improvement the areas of hyper granular tissue in the central portion of the wound seem to be getting worse. Subsequently the wound bed itself also seems to be getting deeper even compared to last week which is both unusual as well as concerning since prior he had been shown signs of improvement. Nonetheless I think that the issue could be that he's actually having some difficulty in issues with a deeper infection. There's no external signs of infection but nonetheless I am more worried about the internal, osteomyelitis, that could be restarting. He has not been on antibiotics for some time at this point. I think that it may be a good idea to go ahead and started back on an antibiotic therapy while we wait to see what the testing shows. 12/27/18 on evaluation today patient presents for follow-up concerning his left gluteal fold wound. Fortunately he appears to be doing well today. I did review the CT scan which was negative for any signs of osteomyelitis or acute abnormality  this is excellent news. Overall I feel like the surface of the wound bed appears to be doing significantly better today compared to previously noted findings. There does not appear any signs of infection nor does he have any pain at this time. 01/03/19 on evaluation today patient actually appears to be doing quite well in regard to his ulcer. Post debridement last week he really did not have too much bleeding which is good news. Fortunately today this seems to be doing some better but we still has some of the hyper granular tissue noted in the  base of the wound which is gonna require sharp debridement today as well. Overall I'm pleased with how things seem to be progressing since we switched away from the Wound VAC I think he is making some progress. 01/10/19 on evaluation today patient appears to be doing better in regard to his right gluteal fold ulcer. He has been tolerating the dressing changes without complication. The debridement to seem to be helping with current away some of the poor hyper granular tissue bugs throughout the region of his gluteal fold wound. He's been tolerating the dressing changes otherwise without complication which is great news. No fevers, chills, nausea, or vomiting noted at this time. 01/17/19 on evaluation today patient actually appears to be doing excellent in regard to his wound. He's been tolerating the dressing changes without complication. Fortunately there is no signs of active infection at this time which is great news. No fevers, chills, nausea, or vomiting noted at this time. 01/24/19 on evaluation today patient actually appears to be doing quite well with regard to his ulcer. He has been tolerating the dressing changes without complication. Fortunately there's no signs of active infection at this time. Overall been very pleased with the progress that he seems to be making currently. 01/31/19-Patient returns at 1 week with apparent similarity in dimensions to the  wound, with no signs of infection, he has been [name], [name]. ([id number]) changing dressings twice a day 02/08/19 upon evaluation today patient actually appears to be doing well with regard to his right Ischial ulcer. The wound is not appear to be quite as deep and seems to be making progress which is good news. With that being said I'm still reluctant to go back to the Wound VAC at this point. He's been having to change the dressings twice a day which is a little bit much in my pinion from the wound care supplies standpoint. I think that possibly attempting to utilize extras orbit may be beneficial this may also help to prevent any additional breakdown secondary to fluid retention in the wound itself. The patient is in agreement with giving this a try. 02/15/19 on evaluation today patient actually appears to be doing decently well with regard to his ulcer in the right to gluteal fold location. He's been tolerating the dressing changes without complication. Fortunately there is no signs of active infection at this time. He is able to keep the current dressing in place more effectively for a day at a time whereas before he was having a changes to to three times a day. The actions or has been helpful in this regard. Fortunately there's no signs of anything getting worse and I do feel like he showing signs of good improvement with regard to the wound bed status. 02/22/2019 patient appears to be doing very well today with regard to his ulcer in the gluteal fold. Fortunately there is no signs of active infection and he has been tolerating the dressing changes without any complication. Overall extremely pleased with how things seem to be progressing. He has much less of the hyper granular projections within the wound these have slowly been debrided away and he seems to be doing well. The wound bed is more uniform. 03/01/19 on evaluation today patient appears to be doing unfortunately about the same in  regard to his gluteal ulcer. He's been tolerating the dressing changes without complication. Fortunately there's no signs of active infection at this time. With that being said he continues to develop these hyper granular projections  which I'm unsure of exactly what they are and why they are rising. Nonetheless I explained to the patient that I do believe it would be a good idea for [place] to stand a biopsy sample for pathology to see if that can [name] any light on what exactly may be going on here. Fortunately I do not see any obvious signs of infection. With that being said the patient has had a little bit more drainage this week apparently compared to last week. 03/08/2019 on evaluation today patient actually appears to look somewhat better with regard to the appearance of his wound bed at this time. This is good news. Overall I am very pleased with how things seem to have progressed just in the past week with a switch to the [facility] dressing. I think that has been beneficial for him. With that being said at this time the patient is concerned about his biopsy that I sent off last week unfortunately I do not have that report as of yet. Nonetheless we have called to obtain this and hopefully will hear back from the lab later this morning. 03/15/19 on evaluation today patient's wound actually appears to be doing okay today with regard to the overall appearance of the wound bed. He has been tolerating the dressing changes without complication he still has hyper granular tissue noted but fortunately that seems to be minimal at this point compared to some of what we've seen in the past. Nonetheless I do think that he is still having some issues currently with some of this type of granulation the biopsy and since all showed nothing more than just evidence of granulation tissue. Therefore there really is nothing different to initiate or do at this point. 03/24/19 on evaluation today patient appears to be  doing a little better with regard to his ulcer. He's been tolerating the dressing changes without complication. Fortunately there is no signs of active infection at this time. No fevers, chills, nausea, or vomiting noted at this time. I'm overall pleased with how things seem to be progressing. 03/29/2019 on evaluation today patient appears to be doing about the same in regard to his ulcer in the right gluteal fold. Unfortunately he is not seeming to make a lot of progress and the wound is somewhat stalled. There is no signs of active infection externally but I am concerned about the possibility of infection continuing in the ischial location which previously he did have infection noted. Again is not able to have an MRI so probably our best option for testing for this would be a triple phase bone scan which will detect subtle changes in the bone more so than plain film [name]-rays. In the past they really have not been beneficial and in fact the CT scans even have been somewhat questionable at times. Nonetheless there is no signs of systemic infection which is at least good news but again his wound is not healing at all on the predicted schedule. 04/05/19 on evaluation today patient appears to be doing well all things considering with regard to his wound and the right gluteal fold. He actually has his triple bone scan scheduled for sometime in the next couple of days. With that being said I've also been looking to other possibilities of what could be causing hyper granular tissue were looking into the bone scan again for evaluating for the risk or possibility of infection deeper and I'm also gonna go ahead and see about obtaining a deep tissue culture today to  send and see if there's any evidence of infection noted on culture. He's in agreement with that plan. Electronic Signature(s) Signed: 04/07/2019 2:05:35 AM By: Conchita Paris, [place] ([id number]) Entered By: Worthy Keeler on  04/06/2019 23:39:57 [name], [name] ([id number]) -------------------------------------------------------------------------------- Physical Exam Details Patient Name: [name] Date of Service: 04/05/2019 8:00 AM Medical Record Number: [id number] Patient Account Number: [id number] Date of Birth/Sex: [birth year] (68 y.o. M) Treating RN: Montey Hora Primary Care Provider: [name] Other Clinician: Referring Provider: [name] Treating Provider/Extender: [name], HOYT Weeks in Treatment: 11 Constitutional Well-nourished and well-hydrated in no acute distress. Respiratory normal breathing without difficulty. Psychiatric this patient is able to make decisions and demonstrates good insight into disease process. Alert and Oriented [name] 3. pleasant and cooperative. Notes Patient's wound bed currently showed signs of again continued hyper granulation which is unfortunate. He's been tolerating the dressing changes without complication. Fortunately there's no signs of active infection at this time. I did obtain a deep wound tissue sample to sin for culture I also did perform sharp agreement to clear way hyper granular tissue is much as was feasible without causing too significant bleeding during the office visit today. Electronic Signature(s) Signed: 04/07/2019 2:05:35 AM By: Worthy Keeler [name] Entered By: Worthy Keeler on 04/06/2019 23:40:45 XUAN, MENOR ([id number]) -------------------------------------------------------------------------------- Physician Orders Details Patient Name: [name] Date of Service: 04/05/2019 8:00 AM Medical Record Number: [id number] Patient Account Number: [id number] Date of Birth/Sex: Jan 07, 1951 (68 y.o. 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 Skin Barriers/Peri-Wound Care Wound #1 Right Gluteal fold o Other: - Apply zinc oxide to peri-wound area. Primary Wound Dressing Wound #1 Right Gluteal fold o Hydrafera Blue Ready Transfer Secondary Dressing Wound #1 Right Gluteal fold o XtraSorb - Secure with Tegaderm Dressing Change Frequency Wound #1 Right Gluteal fold o Change dressing every day. Follow-up Appointments Wound #1 Right Gluteal fold o Return Appointment in 1 week. 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 o If appropriate for treatment, begin HBOT per protocol: o 2.0 ATA for 90 Minutes without Air Breaks o One treatment per day (delivered Monday through Friday unless otherwise specified in Special Instructions below): o Total # of Treatments: - 40 Laboratory o Bacteria identified in Wound by Culture (MICRO) oooo LOINC Code: [id number] oooo Convenience Name: Wound culture routine Electronic Signature(s) Signed: 04/08/2019 4:44:05 PM By: Worthy Keeler [name] Previous Signature: 04/05/2019 3:57:57 PM Version By: Montey Hora Previous Signature: 04/07/2019 2:05:35 AM Version By: Worthy Keeler [name] Entered By: Worthy Keeler on 04/08/2019  12:53:59 [name], [name] ([id number]) -------------------------------------------------------------------------------- Problem List Details Patient Name: [name] Date of Service: 04/05/2019 8:00 AM Medical Record Number: [id number] Patient  Account Number: [id number] Date of Birth/Sex: [birth year] (68 y.o. M) Treating RN: Montey Hora Primary Care Provider: [name] Other Clinician: Referring Provider: [name] Treating Provider/Extender: [name], HOYT Weeks in Treatment: 71 Active Problems ICD-10 Evaluated Encounter Code Description Active Date Today Diagnosis L89.314 Pressure ulcer of right buttock, stage 4 11/30/2017 No Yes M86.68 Other chronic osteomyelitis, other site 04/08/2019 No Yes L03.317 Cellulitis of buttock 06/21/2018 No Yes G82.20 Paraplegia, unspecified 11/30/2017 No Yes S34.109S Unspecified injury to unspecified level of lumbar spinal cord, 11/30/2017 No Yes sequela I10 Essential (primary) hypertension 11/30/2017 No Yes Inactive Problems Resolved Problems Electronic Signature(s) Signed: 04/08/2019 12:47:04 PM By: Worthy Keeler [name] Previous Signature: 04/05/2019 8:11:47 AM Version By: Worthy Keeler [name] Entered By: Worthy Keeler on 04/08/2019 12:47:04 [name] ([id number]) -------------------------------------------------------------------------------- Progress Note Details Patient Name: [name] Date of Service: 04/05/2019 8:00 AM Medical Record Number: [id number] Patient Account Number: [id number] Date of Birth/Sex: 22-Oct-1950 (68 y.o. 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 He states that there is no history of diabetes that he is aware of. He does have issues with his prostate and is currently receiving treatment for this by way of oral medication. With that being said I do not have a lot of details in that regard. Nonetheless the patient presents today as a result of having been referred to [place] by another provider initially home health was set to come out and take care of his wound although due to the fact that he apparently drives he's not able to receive home health. His wife is therefore trying to help take care of this wound within although they have been struggling with what exactly to do at this point. She states that she can do some things but she is definitely not a nurse and does have some issues with looking at blood. The good news is the wound does not appear to be too deep and is fairly superficial at this point. There is no slough noted there is some nonviable skin noted around the surface of the wound and the perimeter at this point. The central portion of the wound appears to be very good with a dermal layer noted this does not appear to be again deep enough to extend it to subcutaneous tissue at this point. 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 In the pump he has Dilaudid and baclofen. 12/07/17 on evaluation today patient presents for follow-up concerning his ongoing lower back thigh ulcer on the right. He states that he did not get the supplies ordered and therefore has not really been able to perform the dressing changes as directed exactly. His wife was able to get some Boarder Foam Dressing's from the drugstore and subsequently has  been using hydrogel which did help to a degree in the wound does appear to be able smaller. There is actually more drainage this week noted than previous. 12/21/17 on evaluation  today patient appears to be doing rather well in regard to his right gluteal ulcer. He has been tolerating the dressing changes without complication. There does not appear to be any evidence of infection at this point in time. Overall the wound does seem to be making some progress as far as the edges are concerned there's not as much in the way of overlapping of the external wound edges and he has a good epithelium to wound bed border for the most part. This however is not true right at the 12 o'clock location over the span of a little over a centimeters which actually will require debridement today to clean this away and hopefully allow it to continue to heal more appropriately. 12/28/17 on evaluation today patient appears to be doing rather well in regard to his ulcer in the left gluteal region. He's been tolerating the dressing changes without complication. Apparently he has had some difficulty getting his dressing material. Apparently there's been some confusion with ordering we're gonna check into this. Nonetheless overall he's been showing signs of improvement which is good news. Debridement is not required today. 01/04/18 on evaluation today patient presents for follow-up concerning his right gluteal ulcer. He has been tolerating the dressing changes fairly well. On inspection today it appears he may actually have some maceration them concerned about the fact that he may be developing too much moisture in and around the wound bed which can cause delay in healing. With that being said he unfortunately really has not showed significant signs of improvement since last week's evaluation in fact this may even be just the little bit/slightly larger. Nonetheless he's been having a lot of discomfort I'm not sure this is  even related to the wound as he has no pain when I'm to breeding or otherwise cleaning the wound during evaluation today. Nonetheless this is something that we did recommend he talked to his pain specialist concerning. 01/11/18 on evaluation today patient appears to be doing better in regard to his ulceration. He has been tolerating the dressing Schader, Onaje J. ([id number]) changes without complication. With that being said overall there's no evidence of infection which is good news. The only thing is he did receive the hatch affair blue classic versus the ready nonetheless I feel like this is perfectly fine and appears to have done well for him over the past week. 01/25/18 on evaluation today patient's wound actually appears to be a little bit larger than during the last evaluation. The good news is the majority of the wound edges actually appear to be fairly firmly attached to the wound bed unfortunately again we're not really making progress in regard to the size. Roughly the wound is about the same size as when I first saw him although again the wound margin/edges appear to be much better. 02/01/18 on evaluation today patient actually appears to be doing very well in regard to his wound. Applying the Prisma dry does seem to be better although he does still have issues with slow progression of the wound. There was a slight improvement compared to last week's measurements today. Nonetheless I have been considering other options as far as the possibility of Theraskin or even a snap vac. In general I'm not sure that the Theraskin due to location of the wound would be a very good idea. Nonetheless I do think that a snap vac could be a possibility for the patient and in fact I think this could even be an excellent way to manage  the wound possibly seeing some improvement in a very rapid fashion here. Nonetheless this is something that we would need to get approved and I did have a lengthy conversation  with the patient about this today. 02/08/18 on evaluation today patient appears to be doing a little better in regard to his ulcer. He has been tolerating the dressing changes without complication. Fortunately despite the fact that the wound is a little bit smaller it's not significantly so unfortunately. We have discussed the possibility of a snap vac we did check with insurance this is actually covered at this point. Fortunately there does not appear to be any sign of infection. Overall I'm fairly pleased with how things seem to be appearing at this point. 02/15/18 on evaluation today patient appears to be doing rather well in regard to his right gluteal ulcer. Unfortunately the snap vac did not stay in place with his sheer and friction this came loose and did not seem to maintain seal very well. He worked for about two days and it did seem to do very well during that time according to his wife but in general this does not seem to be something that's gonna be beneficial for him long-term. I do believe we need to go back to standard dressings to see if we can find something that will be of benefit. 03/02/18- He is here in follow up evaluation; there is minimal change in the wound. He will continue with the same treatment plan, would consider changing to iodosrob/iodoflex if ulcer continues to to plateau. He will follow up next week 03/08/18 on evaluation today patient's wound actually appears to be about the same size as when I previously saw him several weeks back. Unfortunately he does have some slightly dark discoloration in the central portion of the wound which has me concerned about pressure injury. I do believe he may be sitting for too long a period of time in fact he tells me that "I probably sit for much too long". He does have some Slough noted on the surface of the wound and again as far as the size of the wound is concerned I'm really not seeing anything that seems to have improved  significantly. 03/15/18 on evaluation today patient appears to be doing fairly well in regard to his ulcer. The wound measured pretty much about the same today compared to last week's evaluation when looking at his graph. With that being said the area of bruising/deep tissue injury that was noted last week I do not see at this point. He did get a new cushion fortunately this does seem to be have been of benefit in my pinion. It does appear that he's been off of this more which is good news as well I think that is definitely showing in the overall wound measurements. With that being said I do believe that he needs to continue to offload I don't think that the fact this is doing better should be or is going to allow him to not have to offload and explain this to him as well. Overall he seems to be in agreement the plan I think he understands. The overall appearance of the wound bed is improved compared to last week I think the Iodoflex has been beneficial in that regard. 03/29/18 on evaluation today patient actually appears to be doing rather well in regard to his wound from the overall appearance standpoint he does have some granulation although there's some Slough on the surface of the wound noted  as well. With that being said he unfortunately has not improved in regard to the overall measurement of the wound in volume or in size. I did have a discussion with him very specifically about offloading today. He actually does work although he mainly is just sitting throughout the day. He tells me he offloads by "lifting himself up for 30 seconds off of his chair occasionally" purchase from [employer] which does seem to have helped. And he has a new cushion that he with that being said he's also able to stand some for a very short period of time but not significant enough I think to provide appropriate offloading. I think the biggest issue at this point with the wound and the fact is not healing as  quickly as we would like is due to the fact that he is really not able to appropriately offload while at work. He states the beginning after his injury he actually had a bed at his job that he could lay on in order to offload and that does seem to have been of help back at that time. Nonetheless he had not done this in quite some time unfortunately. I think that could be helpful for him this is something I would like for him to look into. [name], [name] ([id number]) 04/05/18 on evaluation today patient actually presents for follow-up concerning his right gluteal ulcer. Again he really is not significantly improved even compared to last week. He has been tolerating the dressing changes without complication. With that being said fortunately there appears to be no evidence of infection at this time. He has been more proactive in trying to offload. 04/12/18 on evaluation today patient actually appears to be doing a little better in regard to his wound and the right gluteal fold region. He's been tolerating the dressing changes since removing the oasis without complication. However he was having a lot of burning initially with the oasis in place. He's unsure of exactly why this was given so much discomfort but he assumes that it was the oasis itself causing the problem. Nonetheless this had to be removed after about three days in place although even those three days seem to have made a fairly good improvement in regard to the overall appearance of the wound bed. In fact is the first time that he's made any improvement from the standpoint of measurements in about six weeks. He continues to have no discomfort over the area of the wound itself which leads me to wonder why he was having the burning with the oasis when he does not even feel the actual debridement's themselves. I am somewhat perplexed by this. 04/19/18 on evaluation today patient's wound actually appears to be showing signs of  epithelialization around the edge of the wound and in general actually appears to be doing better which is good news. He did have the same burning after about three days with applying the Endoform last week in the same fashion that I would generally apply a skin substitute. This seems to indicate that it's not the oasis to cause the problem but potentially the moisture buildup that just causes things to burn or there may be some other reaction with the skin prep or Steri-Strips. Nonetheless I'm not sure that is gonna be able to tolerate any skin substitute for a long period of time. The good news is the wound actually appears to be doing better today compared to last week and does seem to finally be making some progress.  04/26/18 on evaluation today patient actually appears to be doing rather well in regard to his ulcer in the right gluteal fold. He has been tolerating the dressing changes without complication which is good news. The Endoform does seem to be helping although he was a little bit more macerated this week. This seems to be an ongoing issue with fluid control at this point. Nonetheless I think we may be able to add something like Drawtex to help control the drainage. 05/03/18 on evaluation today patient appears to actually be doing better in regard to the overall appearance of his wound. He has been tolerating the dressing changes without complication. Fortunately there appears to be no evidence of infection at this time. I really feel like his wound has shown signs as of today of turning around last week I thought so as well and definitely he could be seen in this week's overall appearance and measurements. In general I'm very pleased with the fact that he finally seems to be making a steady but sure progress. The patient likewise is very pleased. 05/17/18 on evaluation today patient appears to be doing more poorly unfortunately in regard to his ulcer. He has been tolerating the  dressing changes without complication. With that being said he tells me that in the past couple of days he and his wife have noticed that we did not seem to be doing quite as well is getting dark near the center. Subsequently upon evaluation today the wound actually does appear to be doing worse compared to previous. He has been tolerating the dressing changes otherwise and he states that he is not been sitting up anymore than he was in the past from what he tells me. Still he has continued to work he states "I'm tired of dealing with this and if I have to just go home and lay in the bed all the time that's what I'll do". Nonetheless I am concerned about the fact that this wound does appear to be deeper than what it was previous. 05/24/18 upon evaluation today patient actually presents after having been in the hospital due to what was presumed to be sepsis secondary to the wound infection. He had an elevated white blood cell count between 14 and 15. With that being said he does seem to be doing somewhat better now. His wound still is giving him some trouble nonetheless and he is obviously concerned about the fact likely talked about that this does seem to go more deeply than previously noted. I did review his wound culture which showed evidence of Staphylococcus aureus him and group B strep. Nonetheless he is on antibiotics, Levaquin, for this. Subsequently I did review his intake summary from the hospital as well. I also did look at the CT of the lumbar spine with contrast that was performed which showed no bone destruction to suggest lumbar disguises/osteomyelitis or sacral osteomyelitis. There was no paraspinal abscess. Nonetheless it appears this may have been more of just a soft tissue infection at this point which is good news. He still is nonetheless concerned about the wound which again I think is completely reasonable considering everything he's been through recently. 05/31/18 on evaluation  today on evaluation today patient actually appears to be showing signs of his wound be a little bit deeper than what I would like to see. Fortunately he does not show any signs of significant infection although his temperature was 99 today he states he's been checking this at home and has not been  elevated. Nonetheless with the undermining that I'm seeing at this point I am becoming more concerned about the wound I do think that offloading is a key factor here that is preventing the speedy recovery at this point. There does not appear to be any evidence of again over infection noted. He's been using Santyl currently. SLATON, COURTER ([id number]) 06/07/18 the patient presents today for follow-up evaluation regarding the left ulcer in the gluteal region. He has been tolerating the Wound VAC fairly well. He is obviously very frustrated with this he states that to mean is really getting in his way. There does not appear to be any evidence of infection at this time he does have a little bit of odor I do not necessarily associate this with infection just something that we sometimes notice with Wound VAC therapy. With that being said I can definitely catch a tone of discontentment overall in the patient's demeanor today. This when he was previously in the hospital an CT scan was done of the lumbar region which did not reveal any signs of osteomyelitis. With that being said the pelvis in particular was not evaluated distinctly which means he could still have some osteonecrosis I. Nonetheless the Wound VAC was started on Thursday I do want to get this little bit more time before jumping to a CT scan of the pelvis although that is something that I might would recommend if were not see an improvement by that time. 06/14/18 on evaluation today patient actually appears to be doing about the same in regard to his right gluteal ulcer. Again he did have a CT scan of the lumbar spine unfortunately this did not  include the pelvis. Nonetheless with the depth of the wound that I'm seeing today even despite the fact that I'm not seeing any evidence of overt cellulitis I believe there's a good chance that we may be dealing with osteomyelitis somewhere in the right Ischial region. No fevers, chills, nausea, or vomiting noted at this time. 06/21/18 on evaluation today patient actually appears to be doing about the same with regard to his wound. The tunnel at 6 o'clock really does not appear to be any deeper although it is a little bit wider. I think at this point you may want to start packing this with white phone. Unfortunately I have not got approval for the CT scan of the pelvis as of yet due to the fact that Medicare apparently has been denied it due to the diagnosis codes not being appropriate according to Medicare for the test requested. With that being said the patient cannot have an MRI and therefore this is the only option that we have as far as testing is concerned. The patient has had infection and was on antibiotics and been added code for cellulitis of the bottom to see if this will be appropriate for getting the test approved. Nonetheless I'm concerned about the infection have been spread deeper into the Ischial region. 06/28/18 on evaluation today patient actually appears to be doing rather well all things considered in regard to the right gluteal ulcer. He has been tolerating the dressing changes without complication. With that being said the Wound VAC he states does have to be replaced almost every day or at least reinforced unfortunately. Patient actually has his CT scan later this morning we should have the results by tomorrow. 07/05/18 on evaluation today patient presents for follow-up concerning his right Ischial ulcer. He did see the surgeon Dr. Lysle Pearl last week. They  were actually very happy with him and felt like he spent a tremendous amount of time with them as far as discussing his  situation was concerned. In the end Dr. Lysle Pearl did contact me as well and determine that he would not recommend any surgical intervention at this point as he felt like it would not be in the patient's best interest based on what he was seeing. He recommended a referral to infectious disease. Subsequently this is something that Dr. Ines Bloomer office is working on setting up for the patient. As far as evaluation today is concerned the patient's wound actually appears to be worse at this point. I am concerned about how things are progressing and specifically about infection. I do not feel like it's the deeper but the area of depth is definitely widening which does have me concerned. No fevers, chills, nausea, or vomiting noted at this time. I think that we do need initiate antibiotic therapy the patient has an allow allergy to amoxicillin/penicillin he states that he gets a rash since childhood. Nonetheless she's never had the issues with Catholics or cephalosporins in general but he is aware of. 07/27/18 on evaluation today patient presents following admission to the hospital on 07/09/18. He was subsequently discharged on 07/20/18. On 07/15/18 the patient underwent irrigation and debridement was soft tissue biopsy and bone biopsy as well as placement of a Wound VAC in the OR by Dr. Celine Ahr. During the hospital course the patient was placed on a Wound VAC and recommended follow up with surgery in three weeks actually with Dr. Delaine Lame who is infectious disease. The patient was on vancomycin during the hospital course. 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 He also had a bone culture which revealed evidence of methicillin-resistant staph aureus. He is updated CT scan 07/09/18 reveals that he had progression of the which was performed on wound to breakdown down to the trochanter where he actually had irregularities there as well suggestive of osteomyelitis. This was a  change just since 9 December when we last performed a CT scan. Obviously this one had gone downhill quite significantly and rapidly. At this point upon evaluation I feel like in general the patient's wound seems to be doing fairly well all things considered upon my evaluation today. Obviously this is larger and deeper than what I previously evaluated but at the same time he seems to be making some progress as far as the appearance of the granulation tissue is concerned. I'm happy in that regard. No fevers, chills, nausea, or vomiting noted at this time. He is on IV vancomycin and Rocephin at the facility. He is currently in [employer]. 08/03/18 upon evaluation today patient's wound appears to be doing better in regard to the overall appearance at this point in time. Fortunately he's been tolerating the Wound VAC without complication and states that the facility has been taking [name], [name]. ([id number]) excellent care of the wound site. Overall I see some Slough noted on the surface which I am going to attempt sharp debridement today of but nonetheless other than this I feel like he's making progress. 08/09/18 on evaluation today patient's wound appears to be doing much better compared to even last week's evaluation. Do believe that the Wound VAC is been of great benefit for him. He has been tolerating the dressing changes that is the Wound VAC without any complication and he has excellent granulation noted currently. There is no need for sharp debridement at this  point. 08/16/18 on evaluation today patient actually appears to be doing very well in regard to the wound in the right gluteal fold region. This is showing signs of progress and again appears to be very healthy which is excellent news. Fortunately there is no sign of active infection by way of odor or drainage at this point. Overall I'm very pleased with how things stand. He seems to be tolerating the Wound VAC without  complication. 08/23/18 on evaluation today patient actually appears to be doing better in regard to his wound. He has been tolerating the Wound VAC without complication and in fact it has been collecting a significant amount of drainage which I think is good news especially considering how the wound appears. Fortunately there is no signs of infection at this time definitely nothing appears to be worse which is good news. He has not been started on the Bactrim and Flagyl that was recommended by Dr. Delaine Lame yet. I did actually contact her office this morning in order to check and see were things are that regard their gonna be calling me back. 08/30/18 on evaluation today patient actually appears to show signs of excellent improvement today compared to last evaluation. The undermining is getting much better the wound seems to be feeling quite nicely and I'm very pleased that the granulation in general. With that being said overall I feel like the patient has made excellent progress which is great news. No fevers, chills, nausea, or vomiting noted at this time. 09/06/18 on evaluation today patient actually appears to be doing rather well in regard to his right gluteal ulcer. This is showing signs of improvement in overall I'm very pleased with how things seem to be progressing. The patient likewise is please. Overall I see no evidence of infection he is about to complete his oral antibiotic regimen which is the end of the antibiotics for him in just about three days. 09/13/18 on evaluation today patient's right Ischial ulcer appears to be showing signs of continued improvement which is excellent news. He's been tolerating the dressing changes without complication. Fortunately there's no signs of infection and the wound that seems to be doing very well. 09/28/18 on evaluation today patient appears to be doing rather well in regard to his right Ischial ulcer. He's been tolerating the Wound VAC without  complication he knows there's much less drainage than there used to be this obviously is not a bad thing in my pinion. There's no evidence of infection despite the fact is but nothing about it now for several weeks. 10/04/18 on evaluation today patient appears to be doing better in regard to his right Ischial wound. He has been tolerating the Wound VAC without complication and I do believe that the silver nitrate last week was beneficial for him. Fortunately overall there's no evidence of active infection at this time which is great news. No fevers, chills, nausea, or vomiting noted at this time. 10/11/18 on evaluation today patient actually appears to be doing rather well in regard to his Ischial ulcer. He's been tolerating the Wound VAC still without complication I feel like this is doing a good job. No fevers, chills, nausea, or vomiting noted at this time. 11/01/18 on evaluation today patient presents after having not been seen in our clinic for several weeks secondary to the fact that he was on evaluation today patient presents after having not been seen in our clinic for several weeks secondary to the fact that he was in a skilled nursing  facility which was on lockdown currently due to the covert 19 national emergency. Subsequently he was discharged from the facility on this past Friday and subsequently made an appointment to come in to see yesterday. Fortunately there's no signs of active infection at this time which is good news and overall he does seem to have made progress since I last saw. Overall I feel like things are progressing quite nicely. The patient is having no pain. 11/08/18 on evaluation today patient appears to be doing okay in regard to his right gluteal ulcer. He has been utilizing a Wound VAC home health this changing this at this point since he's home from the skilled nursing facility. Fortunately there's no signs of obvious active infection at this time. Unfortunately though  there's no obvious active infection he is having some maceration and his wife states that when the sheets of the Wound VAC office on Sunday when it broke seal that he ended up having significant issues with some smell as well there concerned about the possibility of infection. Fortunately there's No fevers, chills, nausea, or vomiting noted at this time. LAVONNE, AMPARANO ([id number]) 11/15/18 on evaluation today patient actually appears to be doing well in regard to his right gluteal ulcer. He has been tolerating the dressing changes without complication. Specifically the Wound VAC has been utilized up to this point. Fortunately there's no signs of infection and overall I feel like he has made progress even since last week when I last saw him. I'm actually fairly happy with the overall appearance although he does seem to have somewhat of a hyper granular overgrowth in the central portion of the wound which I think may require some sharp debridement to try flatness out possibly utilizing chemical cauterization following. 11/23/18 on evaluation today patient actually appears to be doing very well in regard to his sacral ulcer. He seems to be showing signs of improvement with good granulation. With that being said he still has the small area of hyper granulation right in the central portion of the wound which I'm gonna likely utilize silver nitrate on today. Subsequently he also keeps having a leak at the 6 o'clock location which is unfortunate we may be able to help out with some suggestions to try to prevent this going forward. Fortunately there's no signs of active infection at this time. 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 He's been tolerating the dressing changes without complication. Fortunately there's no signs of active infection at this time. I've been rather pleased with how things have  progressed there still some evidence of pressure getting to the area with some redness right around the immediate wound opening. Nonetheless other than this I'm not seeing any significant complications or issues the wound is somewhat hyper granular. Upon discussing with the patient and his wife today I'm not sure that the wound is being packed to the base with the foam at this point. And if it's not been packed fully that may be part of the reason why is not seen as much improvement as far as the granulation from the base out. Again we do not want pack too tightly but we need some of the firm to get to the base of the wound. I discussed this with patient and his wife today. 12/06/18 on evaluation today patient appears to be doing well in regard to his right gluteal pressure ulcer. He's been  tolerating the dressing changes without complication. Fortunately there's no signs of active infection. He still has some hyper granular tissue and I do think it would be appropriate to continue with the chemical cauterization as of today. 12/16/18 on evaluation today patient actually appears to be doing okay in regard to his right gluteal ulcer. He is been tolerating the dressing changes without complication including the Wound VAC. Overall I feel like nothing seems to be worsening I do feel like that the hyper granulation buds in the central portion of the wound have improved to some degree with the silver nitrate. We will have to see how things continue to progress. 12/20/18 on evaluation today patient actually appears to be doing much worse in my pinion even compared to last week's evaluation. Unfortunately as opposed to showing any signs of improvement the areas of hyper granular tissue in the central portion of the wound seem to be getting worse. Subsequently the wound bed itself also seems to be getting deeper even compared to last week which is both unusual as well as concerning since prior he had been shown  signs of improvement. Nonetheless I think that the issue could be that he's actually having some difficulty in issues with a deeper infection. There's no external signs of infection but nonetheless I am more worried about the internal, osteomyelitis, that could be restarting. He has not been on antibiotics for some time at this point. I think that it may be a good idea to go ahead and started back on an antibiotic therapy while we wait to see what the testing shows. 12/27/18 on evaluation today patient presents for follow-up concerning his left gluteal fold wound. Fortunately he appears to be doing well today. I did review the CT scan which was negative for any signs of osteomyelitis or acute abnormality this is excellent news. Overall I feel like the surface of the wound bed appears to be doing significantly better today compared to previously noted findings. There does not appear any signs of infection nor does he have any pain at this time. 01/03/19 on evaluation today patient actually appears to be doing quite well in regard to his ulcer. Post debridement last week he really did not have too much bleeding which is good news. Fortunately today this seems to be doing some better but we still has some of the hyper granular tissue noted in the base of the wound which is gonna require sharp debridement today as well. Overall I'm pleased with how things seem to be progressing since we switched away from the Wound VAC I think he is making some progress. 01/10/19 on evaluation today patient appears to be doing better in regard to his right gluteal fold ulcer. He has been tolerating the dressing changes without complication. The debridement to seem to be helping with current away some of the poor hyper granular tissue bugs throughout the region of his gluteal fold wound. He's been tolerating the dressing changes otherwise without complication which is great news. No fevers, chills, nausea, or vomiting noted  at this time. 01/17/19 on evaluation today patient actually appears to be doing excellent in regard to his wound. He's been tolerating the dressing changes without complication. Fortunately there is no signs of active infection at this time which is great news. No fevers, chills, nausea, or vomiting noted at this time. JASAIAH, SCACCIA ([id number]) 01/24/19 on evaluation today patient actually appears to be doing quite well with regard to his ulcer. He has been tolerating  the dressing changes without complication. Fortunately there's no signs of active infection at this time. Overall been very pleased with the progress that he seems to be making currently. 01/31/19-Patient returns at 1 week with apparent similarity in dimensions to the wound, with no signs of infection, he has been changing dressings twice a day 02/08/19 upon evaluation today patient actually appears to be doing well with regard to his right Ischial ulcer. The wound is not appear to be quite as deep and seems to be making progress which is good news. With that being said I'm still reluctant to go back to the Wound VAC at this point. He's been having to change the dressings twice a day which is a little bit much in my pinion from the wound care supplies standpoint. I think that possibly attempting to utilize extras orbit may be beneficial this may also help to prevent any additional breakdown secondary to fluid retention in the wound itself. The patient is in agreement with giving this a try. 02/15/19 on evaluation today patient actually appears to be doing decently well with regard to his ulcer in the right to gluteal fold location. He's been tolerating the dressing changes without complication. Fortunately there is no signs of active infection at this time. He is able to keep the current dressing in place more effectively for a day at a time whereas before he was having a changes to to three times a day. The actions or has been  helpful in this regard. Fortunately there's no signs of anything getting worse and I do feel like he showing signs of good improvement with regard to the wound bed status. 02/22/2019 patient appears to be doing very well today with regard to his ulcer in the gluteal fold. Fortunately there is no signs of active infection and he has been tolerating the dressing changes without any complication. Overall extremely pleased with how things seem to be progressing. He has much less of the hyper granular projections within the wound these have slowly been debrided away and he seems to be doing well. The wound bed is more uniform. 03/01/19 on evaluation today patient appears to be doing unfortunately about the same in regard to his gluteal ulcer. He's been tolerating the dressing changes without complication. Fortunately there's no signs of active infection at this time. With that being said he continues to develop these hyper granular projections which I'm unsure of exactly what they are and why they are rising. Nonetheless I explained to the patient that I do believe it would be a good idea for [place] to stand a biopsy sample for pathology to see if that can [name] any light on what exactly may be going on here. Fortunately I do not see any obvious signs of infection. With that being said the patient has had a little bit more drainage this week apparently compared to last week. 03/08/2019 on evaluation today patient actually appears to look somewhat better with regard to the appearance of his wound bed at this time. This is good news. Overall I am very pleased with how things seem to have progressed just in the past week with a switch to the [facility] dressing. I think that has been beneficial for him. With that being said at this time the patient is concerned about his biopsy that I sent off last week unfortunately I do not have that report as of yet. Nonetheless we have called to obtain this and hopefully will  hear back from the  lab later this morning. 03/15/19 on evaluation today patient's wound actually appears to be doing okay today with regard to the overall appearance of the wound bed. He has been tolerating the dressing changes without complication he still has hyper granular tissue noted but fortunately that seems to be minimal at this point compared to some of what we've seen in the past. Nonetheless I do think that he is still having some issues currently with some of this type of granulation the biopsy and since all showed nothing more than just evidence of granulation tissue. Therefore there really is nothing different to initiate or do at this point. 03/24/19 on evaluation today patient appears to be doing a little better with regard to his ulcer. He's been tolerating the dressing changes without complication. Fortunately there is no signs of active infection at this time. No fevers, chills, nausea, or vomiting noted at this time. I'm overall pleased with how things seem to be progressing. 03/29/2019 on evaluation today patient appears to be doing about the same in regard to his ulcer in the right gluteal fold. Unfortunately he is not seeming to make a lot of progress and the wound is somewhat stalled. There is no signs of active infection externally but I am concerned about the possibility of infection continuing in the ischial location which previously he did have infection noted. Again is not able to have an MRI so probably our best option for testing for this would be a triple phase bone scan which will detect subtle changes in the bone more so than plain film [name]-rays. In the past they really have not been beneficial and in fact the CT scans even have been somewhat questionable at times. Nonetheless there is no signs of systemic infection which is at least good news but again his wound is not healing at all on the predicted schedule. 04/05/19 on evaluation today patient appears to be doing well  all things considering with regard to his wound and the right gluteal fold. He actually has his triple bone scan scheduled for sometime in the next couple of days. With that being said I've also been looking to other possibilities of what could be causing hyper granular tissue were looking into the bone scan again for evaluating for the risk or possibility of infection deeper and I'm also gonna go ahead and see about obtaining a deep tissue Blyth, Olanrewaju J. ([id number]) culture today to send and see if there's any evidence of infection noted on culture. He's in agreement with that plan. Patient History Information obtained from Patient. Family History Hypertension - Father, Stroke - Mother, No family history of Cancer, Diabetes, Heart Disease, Kidney Disease, Lung Disease, Seizures, Thyroid Problems, Tuberculosis. Social History Never smoker, Marital Status - Married, Alcohol Use - Never, Drug Use - No History, Caffeine Use - Daily. Medical History Eyes Patient has history of Cataracts - both removed Denies history of Glaucoma, Optic Neuritis Ear/Nose/Mouth/Throat Denies history of Chronic sinus problems/congestion, Middle ear problems Hematologic/Lymphatic Denies history of Anemia, Hemophilia, Human Immunodeficiency Virus, Lymphedema Respiratory Denies history of Aspiration, Asthma, Chronic Obstructive Pulmonary Disease (COPD), Pneumothorax, Sleep Apnea, Tuberculosis Cardiovascular Patient has history of Hypertension - takes medication Denies history of Angina, Arrhythmia, Congestive Heart Failure, Coronary Artery Disease, Deep Vein Thrombosis, Hypotension, Myocardial Infarction, Peripheral Arterial Disease, Peripheral Venous Disease, Phlebitis, Vasculitis Gastrointestinal Denies history of Cirrhosis , Colitis, Crohn s, Hepatitis A, Hepatitis B, Hepatitis C Endocrine Denies history of Type I Diabetes, Type II Diabetes Genitourinary Denies history of  End Stage Renal  Disease Immunological Denies history of Lupus Erythematosus, Raynaud s, Scleroderma Integumentary (Skin) Denies history of History of Burn, History of pressure wounds Musculoskeletal Denies history of Gout, Rheumatoid Arthritis, Osteoarthritis, Osteomyelitis Neurologic Patient has history of Paraplegia - waist down Denies history of Dementia, Neuropathy, Quadriplegia, Seizure Disorder Oncologic Denies history of Received Chemotherapy, Received Radiation Psychiatric Denies history of Anorexia/bulimia, Confinement Anxiety Medical And Surgical History Notes Oncologic Prostate cancer- currently treated with horomone therapy Review of Systems (ROS) Constitutional Symptoms (General Health) Denies complaints or symptoms of Fatigue, Fever, Chills, Marked Weight Change. Respiratory Denies complaints or symptoms of Chronic or frequent coughs, Shortness of Breath. Cardiovascular Denies complaints or symptoms of Chest pain, LE edema. CLARIS, HUGER ([id number]) Psychiatric Denies complaints or symptoms of Anxiety, Claustrophobia. Objective Constitutional Well-nourished and well-hydrated in no acute distress. Vitals Time Taken: 8:05 AM, Height: 73 in, Weight: 210 lbs, BMI: 27.7, Temperature: 98.8 F, Pulse: 76 bpm, Respiratory Rate: 16 breaths/min, Blood Pressure: 140/82 mmHg. Respiratory normal breathing without difficulty. Psychiatric this patient is able to make decisions and demonstrates good insight into disease process. Alert and Oriented [name] 3. pleasant and cooperative. General Notes: Patient's wound bed currently showed signs of again continued hyper granulation which is unfortunate. He's been tolerating the dressing changes without complication. Fortunately there's no signs of active infection at this time. I did obtain a deep wound tissue sample to sin for culture I also did perform sharp agreement to clear way hyper granular tissue is much as was feasible without causing too  significant bleeding during the office visit today. Integumentary (Hair, Skin) Wound #1 status is Open. Original cause of wound was Pressure Injury. The wound is located on the Right Gluteal fold. The wound measures 3.6cm length [name] 1.6cm width [name] 2.8cm depth; 4.524cm^2 area and 12.667cm^3 volume. There is muscle and Fat Layer (Subcutaneous Tissue) Exposed exposed. There is tunneling at 12:00 with a maximum distance of 3.7cm. There is a large amount of serosanguineous drainage noted. The wound margin is epibole. There is medium (34-66%) red, pink, hyper - granulation within the wound bed. There is a medium (34-66%) amount of necrotic tissue within the wound bed including Adherent Slough. 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 ([id number]) Procedures Wound #1 Pre-procedure diagnosis of Wound #1 is a Pressure Ulcer located on the Right Gluteal fold . There was a Excisional Skin/Subcutaneous Tissue Debridement with a total area of 5.76 sq cm performed by [name], HOYT E., [name]. With the following instrument(s): Curette to remove Viable and Non-Viable tissue/material. Material removed includes Subcutaneous Tissue, Slough, and Hyper-granulation after achieving pain control using Lidocaine 4% Topical Solution. 1 specimen was taken by a Tissue Culture and sent to the lab per facility protocol. A time out was conducted at 08:32, prior to the start of the procedure. A Moderate amount of bleeding was controlled with Silver Nitrate. The procedure was tolerated well with a pain level of 0 throughout and a pain level of 0 following the procedure. Post Debridement Measurements: 3.6cm length [name] 1.6cm width [name] 3cm depth; 13.572cm^3 volume. Post debridement Stage  noted as Category/Stage IV. Character of Wound/Ulcer Post Debridement is improved. Post procedure Diagnosis Wound #1: Same as Pre-Procedure General Notes: 4 sticks of silver nitrate used. Plan Wound Cleansing: Wound #1 Right Gluteal fold: Other: - Cleanse wound  with Dakin's Solution Anesthetic (add to Medication List): Wound #1 Right Gluteal fold: Topical Lidocaine 4% cream applied to wound bed prior to debridement (In Clinic Only). Skin Barriers/Peri-Wound Care: Wound #1 Right Gluteal fold: Other: - Apply zinc oxide to peri-wound area. Primary Wound Dressing: Wound #1 Right Gluteal fold: Hydrafera Blue Ready Transfer Secondary Dressing: Wound #1 Right Gluteal fold: XtraSorb - Secure with Tegaderm Dressing Change Frequency: Wound #1 Right Gluteal fold: Change dressing every day. Follow-up Appointments: Wound #1 Right Gluteal fold: Return Appointment in 1 week. 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 ([id number]) 1. my suggestion currently is gonna be that we go ahead and continue with the [facility] Dressing I still feel like this is probably the best thing for him. The patient is in agreement the plan. 2. I'm also gonna recommend that we continue with that sure sort of along with the tegaderm to secure this for the time being. 3. Also I'm gonna suggest we continue with appropriate offloading to keep pressure off of the area as we've discussed many times prior in previous visits. 4. We  will check the results of both the tissue culture as well as the bone scan as far as that is concerned as soon as the information returns. I will advise and then what we need to do going forward. Please see above for specific wound care orders. We will see patient for re-evaluation in 1 week(s) here in the clinic. If anything worsens or changes patient will contact our office for additional recommendations. Electronic Signature(s) Signed: 04/08/2019 12:55:07 PM By: Worthy Keeler [name] Previous Signature: 04/07/2019 2:05:35 AM Version By: Worthy Keeler [name] Entered By: Worthy Keeler on 04/08/2019 12:55:06 PAUBLO, ZIMMERLE ([id number]) -------------------------------------------------------------------------------- ROS/PFSH Details Patient Name: [name] Date of Service: 04/05/2019 8:00 AM Medical Record Number: [id number] Patient Account Number: [id number] Date of Birth/Sex: 20-Feb-1951 (68 y.o. M) Treating RN: Montey Hora Primary Care Provider: [name] Other Clinician: Referring Provider: [name] Treating Provider/Extender: [name], HOYT Weeks in Treatment: 77 Information Obtained From Patient Constitutional Symptoms (General Health) Complaints and Symptoms: Negative for: Fatigue; Fever; Chills; Marked Weight Change Respiratory Complaints and Symptoms: Negative for: Chronic or frequent coughs; Shortness of Breath Medical History: Negative for: Aspiration; Asthma; Chronic Obstructive Pulmonary Disease (COPD); Pneumothorax; Sleep Apnea; Tuberculosis Cardiovascular Complaints and Symptoms: Negative for: Chest pain; LE edema Medical History: Positive for: Hypertension - takes medication Negative for: Angina; Arrhythmia; Congestive Heart Failure; Coronary Artery Disease; Deep Vein Thrombosis; Hypotension; Myocardial Infarction; Peripheral Arterial Disease; Peripheral Venous Disease; Phlebitis; Vasculitis Psychiatric Complaints and  Symptoms: Negative for: Anxiety; Claustrophobia Medical History: Negative for: Anorexia/bulimia; Confinement Anxiety Eyes Medical History: Positive for: Cataracts - both removed Negative for: Glaucoma; Optic Neuritis Ear/Nose/Mouth/Throat Medical History: Negative for: Chronic sinus problems/congestion; Middle ear problems Hematologic/Lymphatic Medical History: Negative for: Anemia; Hemophilia; Human Immunodeficiency Virus; Lymphedema BLAYZ, OCLAIR. ([id number]) Gastrointestinal Medical History: Negative for: Cirrhosis ; Colitis; Crohnos; Hepatitis A; Hepatitis B; Hepatitis C Endocrine Medical History: Negative for: Type I Diabetes; Type II Diabetes Genitourinary Medical History: Negative for: End Stage Renal  Disease Immunological Medical History: Negative for: Lupus Erythematosus; Raynaudos; Scleroderma Integumentary (Skin) Medical History: Negative for: History of Burn; History of pressure wounds Musculoskeletal Medical History: Negative for: Gout; Rheumatoid Arthritis; Osteoarthritis; Osteomyelitis Neurologic Medical History: Positive for: Paraplegia - waist down Negative for: Dementia; Neuropathy; Quadriplegia; Seizure Disorder Oncologic Medical History: Negative for: Received Chemotherapy; Received Radiation Past Medical History Notes: Prostate cancer- currently treated with horomone therapy HBO Extended History Items Eyes: Cataracts Immunizations Pneumococcal Vaccine: Received Pneumococcal Vaccination: No Implantable Devices No devices added Family and Social History Cancer: No; Diabetes: No; Heart Disease: No; Hypertension: Yes - Father; Kidney Disease: No; Lung Disease: No; Seizures: No; Stroke: Yes - Mother; Thyroid Problems: No; Tuberculosis: No; Never smoker; Marital Status - Married; Alcohol Use: Never; Drug Use: No History; Caffeine Use: Daily DREVYN, ROEBUCK ([id number]) Physician Affirmation I have reviewed and agree with the above  information. Electronic Signature(s) Signed: 04/07/2019 2:05:35 AM By: Worthy Keeler [name] Signed: 04/08/2019 4:27:31 PM By: Montey Hora Entered By: Worthy Keeler on 04/06/2019 23:40:15 AHADU, SKAHAN ([id number]) -------------------------------------------------------------------------------- SuperBill Details Patient Name: [name] Date of Service: 04/05/2019 Medical Record Number: [id number] Patient Account Number: [id number] Date of Birth/Sex: [birth year] (68 y.o. 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, sequela I10 Essential (primary) hypertension Facility Procedures CPT4 Code: [id number] Description: [id number] - DEB SUBQ TISSUE 20 SQ CM/< ICD-10 Diagnosis Description L89.314 Pressure ulcer of right buttock, stage 4 Modifier: Quantity: 1 Physician Procedures CPT4 Code: [id number] Description: [id number] - WC PHYS LEVEL 4 - NEW PT ICD-10 Diagnosis Description L89.314 Pressure ulcer of right buttock, stage 4 L03.317 Cellulitis of buttock G82.20 Paraplegia, unspecified S34.109S Unspecified injury to unspecified level of lumbar spinal cor Modifier: 25 d, sequela Quantity: 1 CPT4 Code: [id number] Description: 11042 - WC PHYS SUBQ TISS 20 SQ CM ICD-10 Diagnosis Description L89.314 Pressure ulcer of right buttock, stage 4 Modifier: Quantity: 1 Electronic Signature(s) Signed: 04/07/2019 2:05:35 AM By: Worthy Keeler [name] Entered By: Worthy Keeler on 04/05/2019 23:21:17

## 2019-04-06 ENCOUNTER — Encounter
Admission: RE | Admit: 2019-04-06 | Discharge: 2019-04-06 | Disposition: A | Discharge status: Home / Self Care | Payer: Medicare Other | Source: Ambulatory Visit | Attending: Physician Assistant | Admitting: Physician Assistant

## 2019-04-06 DIAGNOSIS — L89314 Pressure ulcer of right buttock, stage 4: Secondary | ICD-10-CM | POA: Insufficient documentation

## 2019-04-06 DIAGNOSIS — L03317 Cellulitis of buttock: Secondary | ICD-10-CM | POA: Diagnosis not present

## 2019-04-06 MED ORDER — TECHNETIUM TC 99M MEDRONATE IV KIT
20.0000 | PACK | Freq: Once | INTRAVENOUS | Status: AC | PRN
  Administered 2019-04-06: 23.139 via INTRAVENOUS

## 2019-04-08 LAB — AEROBIC CULTURE W GRAM STAIN (SUPERFICIAL SPECIMEN)

## 2019-04-12 ENCOUNTER — Encounter: Payer: Medicare Other | Admitting: Physician Assistant

## 2019-04-12 ENCOUNTER — Other Ambulatory Visit: Payer: Self-pay

## 2019-04-12 DIAGNOSIS — L89314 Pressure ulcer of right buttock, stage 4: Secondary | ICD-10-CM | POA: Diagnosis not present

## 2019-04-12 NOTE — Progress Notes (Addendum)
[name] ([id number]) Visit Report for 04/12/2019 Chief Complaint Document Details Patient Name: [name] Date of Service: 04/12/2019 8:00 AM Medical Record Number: [id number] Patient Account Number: [id number] Date of Birth/Sex: May 27, 1951 (68 y.o. M) Treating RN: Montey Hora Primary Care Provider: [name] Other Clinician: Referring Provider: [name] Treating Provider/Extender: [name], HOYT Weeks in Treatment: 50 Information Obtained from: Patient Chief Complaint Right gluteal fold ulcer Electronic Signature(s) Signed: 04/12/2019 8:25:10 AM By: Worthy Keeler [name] Entered By: Worthy Keeler on 04/12/2019 08:25:09 [name] ([id number]) -------------------------------------------------------------------------------- HPI Details Patient Name: [name] Date of Service: 04/12/2019 8:00 AM Medical Record Number: [id number] Patient Account Number: [id number] Date of Birth/Sex: [birth year] (68 y.o. 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 He states that there is no history of diabetes that he is aware of. He does have issues with his prostate and is currently receiving treatment for this by way of oral medication. With that being said I do not have a lot of details in that regard. Nonetheless the patient presents today as a result of having been referred to [place] by another provider initially home health was set to come out and take care of his wound although due to the fact that  he apparently drives he's not able to receive home health. His wife is therefore trying to help take care of this wound within although they have been struggling with what exactly to do at this point. She states that she can do some things but she is definitely not a nurse and does have some issues with looking at blood. The good news is the wound does not appear to be too deep and is fairly superficial at this point. There is no slough noted there is some nonviable skin noted around the surface of the wound and the perimeter at this point. The central portion of the wound appears to be very good with a dermal layer noted this does not appear to be again deep enough to extend it to subcutaneous tissue at this point. 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 In the pump he has Dilaudid and baclofen. 12/07/17 on evaluation today patient presents for follow-up concerning his ongoing lower back thigh ulcer on the right. He states that he did not get the supplies ordered and therefore has not really been able to perform the dressing changes as directed exactly. His wife was able to get some Boarder Foam Dressing's from the drugstore and subsequently has been using hydrogel which did help to a degree in the wound does appear to be able smaller. There is actually more drainage this week noted than previous. 12/21/17 on evaluation today patient appears to be doing rather well in regard to his right gluteal ulcer. He has been tolerating the dressing changes without complication. There does not appear to be any evidence of infection at this point in time. Overall the wound does seem to be making some progress as far as the edges  are concerned there's not as much in the way of overlapping of the external wound edges and he has a good epithelium to wound bed border for the most part. This however is not true  right at the 12 o'clock location over the span of a little over a centimeters which actually will require debridement today to clean this away and hopefully allow it to continue to heal more appropriately. 12/28/17 on evaluation today patient appears to be doing rather well in regard to his ulcer in the left gluteal region. He's been tolerating the dressing changes without complication. Apparently he has had some difficulty getting his dressing material. Apparently there's been some confusion with ordering we're gonna check into this. Nonetheless overall he's been showing signs of improvement which is good news. Debridement is not required today. 01/04/18 on evaluation today patient presents for follow-up concerning his right gluteal ulcer. He has been tolerating the dressing changes fairly well. On inspection today it appears he may actually have some maceration them concerned about the fact that he may be developing too much moisture in and around the wound bed which can cause delay in healing. With that being said he unfortunately really has not showed significant signs of improvement since last week's evaluation in fact this may even be just the little bit/slightly larger. Nonetheless he's been having a lot of discomfort I'm not sure this is even related to the wound as he has no pain when I'm to breeding or otherwise cleaning the wound during evaluation today. Nonetheless this is something that we did recommend he talked to his pain specialist concerning. 01/11/18 on evaluation today patient appears to be doing better in regard to his ulceration. He has been tolerating the dressing changes without complication. With that being said overall there's no evidence of infection which is good news. The only thing is he did receive the hatch affair blue classic versus the ready nonetheless I feel like this is perfectly fine and appears to have done well for him over the past week. 01/25/18 on evaluation  today patient's wound actually appears to be a little bit larger than during the last evaluation. The good MOSES, ODOHERTY. ([id number]) news is the majority of the wound edges actually appear to be fairly firmly attached to the wound bed unfortunately again we're not really making progress in regard to the size. Roughly the wound is about the same size as when I first saw him although again the wound margin/edges appear to be much better. 02/01/18 on evaluation today patient actually appears to be doing very well in regard to his wound. Applying the Prisma dry does seem to be better although he does still have issues with slow progression of the wound. There was a slight improvement compared to last week's measurements today. Nonetheless I have been considering other options as far as the possibility of Theraskin or even a snap vac. In general I'm not sure that the Theraskin due to location of the wound would be a very good idea. Nonetheless I do think that a snap vac could be a possibility for the patient and in fact I think this could even be an excellent way to manage the wound possibly seeing some improvement in a very rapid fashion here. Nonetheless this is something that we would need to get approved and I did have a lengthy conversation with the patient about this today. 02/08/18 on evaluation today patient appears to be doing a little better in regard to his ulcer.  He has been tolerating the dressing changes without complication. Fortunately despite the fact that the wound is a little bit smaller it's not significantly so unfortunately. We have discussed the possibility of a snap vac we did check with insurance this is actually covered at this point. Fortunately there does not appear to be any sign of infection. Overall I'm fairly pleased with how things seem to be appearing at this point. 02/15/18 on evaluation today patient appears to be doing rather well in regard to his right gluteal  ulcer. Unfortunately the snap vac did not stay in place with his sheer and friction this came loose and did not seem to maintain seal very well. He worked for about two days and it did seem to do very well during that time according to his wife but in general this does not seem to be something that's gonna be beneficial for him long-term. I do believe we need to go back to standard dressings to see if we can find something that will be of benefit. 03/02/18- He is here in follow up evaluation; there is minimal change in the wound. He will continue with the same treatment plan, would consider changing to iodosrob/iodoflex if ulcer continues to to plateau. He will follow up next week 03/08/18 on evaluation today patient's wound actually appears to be about the same size as when I previously saw him several weeks back. Unfortunately he does have some slightly dark discoloration in the central portion of the wound which has me concerned about pressure injury. I do believe he may be sitting for too long a period of time in fact he tells me that "I probably sit for much too long". He does have some Slough noted on the surface of the wound and again as far as the size of the wound is concerned I'm really not seeing anything that seems to have improved significantly. 03/15/18 on evaluation today patient appears to be doing fairly well in regard to his ulcer. The wound measured pretty much about the same today compared to last week's evaluation when looking at his graph. With that being said the area of bruising/deep tissue injury that was noted last week I do not see at this point. He did get a new cushion fortunately this does seem to be have been of benefit in my pinion. It does appear that he's been off of this more which is good news as well I think that is definitely showing in the overall wound measurements. With that being said I do believe that he needs to continue to offload I don't think that the fact  this is doing better should be or is going to allow him to not have to offload and explain this to him as well. Overall he seems to be in agreement the plan I think he understands. The overall appearance of the wound bed is improved compared to last week I think the Iodoflex has been beneficial in that regard. 03/29/18 on evaluation today patient actually appears to be doing rather well in regard to his wound from the overall appearance standpoint he does have some granulation although there's some Slough on the surface of the wound noted as well. With that being said he unfortunately has not improved in regard to the overall measurement of the wound in volume or in size. I did have a discussion with him very specifically about offloading today. He actually does work although he mainly is just sitting throughout the day. He tells me  he offloads by "lifting himself up for 30 seconds off of his chair occasionally" purchase from [employer] which does seem to have helped. And he has a new cushion that he with that being said he's also able to stand some for a very short period of time but not significant enough I think to provide appropriate offloading. I think the biggest issue at this point with the wound and the fact is not healing as quickly as we would like is due to the fact that he is really not able to appropriately offload while at work. He states the beginning after his injury he actually had a bed at his job that he could lay on in order to offload and that does seem to have been of help back at that time. Nonetheless he had not done this in quite some time unfortunately. I think that could be helpful for him this is something I would like for him to look into. 04/05/18 on evaluation today patient actually presents for follow-up concerning his right gluteal ulcer. Again he really is not significantly improved even compared to last week. He has been tolerating the dressing changes without  complication. With that being said fortunately there appears to be no evidence of infection at this time. He has been more proactive in trying to offload. DEANNA, BOEHLKE ([id number]) 04/12/18 on evaluation today patient actually appears to be doing a little better in regard to his wound and the right gluteal fold region. He's been tolerating the dressing changes since removing the oasis without complication. However he was having a lot of burning initially with the oasis in place. He's unsure of exactly why this was given so much discomfort but he assumes that it was the oasis itself causing the problem. Nonetheless this had to be removed after about three days in place although even those three days seem to have made a fairly good improvement in regard to the overall appearance of the wound bed. In fact is the first time that he's made any improvement from the standpoint of measurements in about six weeks. He continues to have no discomfort over the area of the wound itself which leads me to wonder why he was having the burning with the oasis when he does not even feel the actual debridement's themselves. I am somewhat perplexed by this. 04/19/18 on evaluation today patient's wound actually appears to be showing signs of epithelialization around the edge of the wound and in general actually appears to be doing better which is good news. He did have the same burning after about three days with applying the Endoform last week in the same fashion that I would generally apply a skin substitute. This seems to indicate that it's not the oasis to cause the problem but potentially the moisture buildup that just causes things to burn or there may be some other reaction with the skin prep or Steri-Strips. Nonetheless I'm not sure that is gonna be able to tolerate any skin substitute for a long period of time. The good news is the wound actually appears to be doing better today compared to last week and  does seem to finally be making some progress. 04/26/18 on evaluation today patient actually appears to be doing rather well in regard to his ulcer in the right gluteal fold. He has been tolerating the dressing changes without complication which is good news. The Endoform does seem to be helping although he was a little bit more macerated this week. This  seems to be an ongoing issue with fluid control at this point. Nonetheless I think we may be able to add something like Drawtex to help control the drainage. 05/03/18 on evaluation today patient appears to actually be doing better in regard to the overall appearance of his wound. He has been tolerating the dressing changes without complication. Fortunately there appears to be no evidence of infection at this time. I really feel like his wound has shown signs as of today of turning around last week I thought so as well and definitely he could be seen in this week's overall appearance and measurements. In general I'm very pleased with the fact that he finally seems to be making a steady but sure progress. The patient likewise is very pleased. 05/17/18 on evaluation today patient appears to be doing more poorly unfortunately in regard to his ulcer. He has been tolerating the dressing changes without complication. With that being said he tells me that in the past couple of days he and his wife have noticed that we did not seem to be doing quite as well is getting dark near the center. Subsequently upon evaluation today the wound actually does appear to be doing worse compared to previous. He has been tolerating the dressing changes otherwise and he states that he is not been sitting up anymore than he was in the past from what he tells me. Still he has continued to work he states "I'm tired of dealing with this and if I have to just go home and lay in the bed all the time that's what I'll do". Nonetheless I am concerned about the fact that this wound does  appear to be deeper than what it was previous. 05/24/18 upon evaluation today patient actually presents after having been in the hospital due to what was presumed to be sepsis secondary to the wound infection. He had an elevated white blood cell count between 14 and 15. With that being said he does seem to be doing somewhat better now. His wound still is giving him some trouble nonetheless and he is obviously concerned about the fact likely talked about that this does seem to go more deeply than previously noted. I did review his wound culture which showed evidence of Staphylococcus aureus him and group B strep. Nonetheless he is on antibiotics, Levaquin, for this. Subsequently I did review his intake summary from the hospital as well. I also did look at the CT of the lumbar spine with contrast that was performed which showed no bone destruction to suggest lumbar disguises/osteomyelitis or sacral osteomyelitis. There was no paraspinal abscess. Nonetheless it appears this may have been more of just a soft tissue infection at this point which is good news. He still is nonetheless concerned about the wound which again I think is completely reasonable considering everything he's been through recently. 05/31/18 on evaluation today on evaluation today patient actually appears to be showing signs of his wound be a little bit deeper than what I would like to see. Fortunately he does not show any signs of significant infection although his temperature was 99 today he states he's been checking this at home and has not been elevated. Nonetheless with the undermining that I'm seeing at this point I am becoming more concerned about the wound I do think that offloading is a key factor here that is preventing the speedy recovery at this point. There does not appear to be any evidence of again over infection noted. He's been using  Santyl currently. 06/07/18 the patient presents today for follow-up evaluation  regarding the left ulcer in the gluteal region. He has been tolerating the Wound VAC fairly well. He is obviously very frustrated with this he states that to mean is really getting in his way. There does not appear to be any evidence of infection at this time he does have a little bit of odor I do not necessarily associate this with infection just something that we sometimes notice with Wound VAC therapy. With that being said I can definitely catch a tone of discontentment overall in the patient's demeanor today. This when he was previously in the hospital ARSHIA, RONDON. ([id number]) an CT scan was done of the lumbar region which did not reveal any signs of osteomyelitis. With that being said the pelvis in particular was not evaluated distinctly which means he could still have some osteonecrosis I. Nonetheless the Wound VAC was started on Thursday I do want to get this little bit more time before jumping to a CT scan of the pelvis although that is something that I might would recommend if were not see an improvement by that time. 06/14/18 on evaluation today patient actually appears to be doing about the same in regard to his right gluteal ulcer. Again he did have a CT scan of the lumbar spine unfortunately this did not include the pelvis. Nonetheless with the depth of the wound that I'm seeing today even despite the fact that I'm not seeing any evidence of overt cellulitis I believe there's a good chance that we may be dealing with osteomyelitis somewhere in the right Ischial region. No fevers, chills, nausea, or vomiting noted at this time. 06/21/18 on evaluation today patient actually appears to be doing about the same with regard to his wound. The tunnel at 6 o'clock really does not appear to be any deeper although it is a little bit wider. I think at this point you may want to start packing this with white phone. Unfortunately I have not got approval for the CT scan of the pelvis as of yet  due to the fact that Medicare apparently has been denied it due to the diagnosis codes not being appropriate according to Medicare for the test requested. With that being said the patient cannot have an MRI and therefore this is the only option that we have as far as testing is concerned. The patient has had infection and was on antibiotics and been added code for cellulitis of the bottom to see if this will be appropriate for getting the test approved. Nonetheless I'm concerned about the infection have been spread deeper into the Ischial region. 06/28/18 on evaluation today patient actually appears to be doing rather well all things considered in regard to the right gluteal ulcer. He has been tolerating the dressing changes without complication. With that being said the Wound VAC he states does have to be replaced almost every day or at least reinforced unfortunately. Patient actually has his CT scan later this morning we should have the results by tomorrow. 07/05/18 on evaluation today patient presents for follow-up concerning his right Ischial ulcer. He did see the surgeon Dr. Lysle Pearl last week. They were actually very happy with him and felt like he spent a tremendous amount of time with them as far as discussing his situation was concerned. In the end Dr. Lysle Pearl did contact me as well and determine that he would not recommend any surgical intervention at this point as he felt like  it would not be in the patient's best interest based on what he was seeing. He recommended a referral to infectious disease. Subsequently this is something that Dr. Ines Bloomer office is working on setting up for the patient. As far as evaluation today is concerned the patient's wound actually appears to be worse at this point. I am concerned about how things are progressing and specifically about infection. I do not feel like it's the deeper but the area of depth is definitely widening which does have me concerned. No  fevers, chills, nausea, or vomiting noted at this time. I think that we do need initiate antibiotic therapy the patient has an allow allergy to amoxicillin/penicillin he states that he gets a rash since childhood. Nonetheless she's never had the issues with Catholics or cephalosporins in general but he is aware of. 07/27/18 on evaluation today patient presents following admission to the hospital on 07/09/18. He was subsequently discharged on 07/20/18. On 07/15/18 the patient underwent irrigation and debridement was soft tissue biopsy and bone biopsy as well as placement of a Wound VAC in the OR by Dr. Celine Ahr. During the hospital course the patient was placed on a Wound VAC and recommended follow up with surgery in three weeks actually with Dr. Delaine Lame who is infectious disease. The patient was on vancomycin during the hospital course. 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 He also had a bone culture which revealed evidence of methicillin-resistant staph aureus. He is updated CT scan 07/09/18 reveals that he had progression of the which was performed on wound to breakdown down to the trochanter where he actually had irregularities there as well suggestive of osteomyelitis. This was a change just since 9 December when we last performed a CT scan. Obviously this one had gone downhill quite significantly and rapidly. At this point upon evaluation I feel like in general the patient's wound seems to be doing fairly well all things considered upon my evaluation today. Obviously this is larger and deeper than what I previously evaluated but at the same time he seems to be making some progress as far as the appearance of the granulation tissue is concerned. I'm happy in that regard. No fevers, chills, nausea, or vomiting noted at this time. He is on IV vancomycin and Rocephin at the facility. He is currently in [employer]. 08/03/18 upon evaluation today  patient's wound appears to be doing better in regard to the overall appearance at this point in time. Fortunately he's been tolerating the Wound VAC without complication and states that the facility has been taking excellent care of the wound site. Overall I see some Slough noted on the surface which I am going to attempt sharp debridement today of but nonetheless other than this I feel like he's making progress. 08/09/18 on evaluation today patient's wound appears to be doing much better compared to even last week's evaluation. Do believe that the Wound VAC is been of great benefit for him. He has been tolerating the dressing changes that is the Wound LAREN, ESTRIDGE. ([id number]) VAC without any complication and he has excellent granulation noted currently. There is no need for sharp debridement at this point. 08/16/18 on evaluation today patient actually appears to be doing very well in regard to the wound in the right gluteal fold region. This is showing signs of progress and again appears to be very healthy which is excellent news. Fortunately there is no sign of active infection by way of odor  or drainage at this point. Overall I'm very pleased with how things stand. He seems to be tolerating the Wound VAC without complication. 08/23/18 on evaluation today patient actually appears to be doing better in regard to his wound. He has been tolerating the Wound VAC without complication and in fact it has been collecting a significant amount of drainage which I think is good news especially considering how the wound appears. Fortunately there is no signs of infection at this time definitely nothing appears to be worse which is good news. He has not been started on the Bactrim and Flagyl that was recommended by Dr. Delaine Lame yet. I did actually contact her office this morning in order to check and see were things are that regard their gonna be calling me back. 08/30/18 on evaluation today patient  actually appears to show signs of excellent improvement today compared to last evaluation. The undermining is getting much better the wound seems to be feeling quite nicely and I'm very pleased that the granulation in general. With that being said overall I feel like the patient has made excellent progress which is great news. No fevers, chills, nausea, or vomiting noted at this time. 09/06/18 on evaluation today patient actually appears to be doing rather well in regard to his right gluteal ulcer. This is showing signs of improvement in overall I'm very pleased with how things seem to be progressing. The patient likewise is please. Overall I see no evidence of infection he is about to complete his oral antibiotic regimen which is the end of the antibiotics for him in just about three days. 09/13/18 on evaluation today patient's right Ischial ulcer appears to be showing signs of continued improvement which is excellent news. He's been tolerating the dressing changes without complication. Fortunately there's no signs of infection and the wound that seems to be doing very well. 09/28/18 on evaluation today patient appears to be doing rather well in regard to his right Ischial ulcer. He's been tolerating the Wound VAC without complication he knows there's much less drainage than there used to be this obviously is not a bad thing in my pinion. There's no evidence of infection despite the fact is but nothing about it now for several weeks. 10/04/18 on evaluation today patient appears to be doing better in regard to his right Ischial wound. He has been tolerating the Wound VAC without complication and I do believe that the silver nitrate last week was beneficial for him. Fortunately overall there's no evidence of active infection at this time which is great news. No fevers, chills, nausea, or vomiting noted at this time. 10/11/18 on evaluation today patient actually appears to be doing rather well in regard  to his Ischial ulcer. He's been tolerating the Wound VAC still without complication I feel like this is doing a good job. No fevers, chills, nausea, or vomiting noted at this time. 11/01/18 on evaluation today patient presents after having not been seen in our clinic for several weeks secondary to the fact that he was on evaluation today patient presents after having not been seen in our clinic for several weeks secondary to the fact that he was in a skilled nursing facility which was on lockdown currently due to the covert 19 national emergency. Subsequently he was discharged from the facility on this past Friday and subsequently made an appointment to come in to see yesterday. Fortunately there's no signs of active infection at this time which is good news and overall he does  seem to have made progress since I last saw. Overall I feel like things are progressing quite nicely. The patient is having no pain. 11/08/18 on evaluation today patient appears to be doing okay in regard to his right gluteal ulcer. He has been utilizing a Wound VAC home health this changing this at this point since he's home from the skilled nursing facility. Fortunately there's no signs of obvious active infection at this time. Unfortunately though there's no obvious active infection he is having some maceration and his wife states that when the sheets of the Wound VAC office on Sunday when it broke seal that he ended up having significant issues with some smell as well there concerned about the possibility of infection. Fortunately there's No fevers, chills, nausea, or vomiting noted at this time. 11/15/18 on evaluation today patient actually appears to be doing well in regard to his right gluteal ulcer. He has been tolerating the dressing changes without complication. Specifically the Wound VAC has been utilized up to this point. Fortunately there's no signs of infection and overall I feel like he has made progress even  since last week when I last saw him. I'm actually fairly happy with the overall appearance although he does seem to have somewhat of a hyper granular Dorion, Rodric J. ([id number]) overgrowth in the central portion of the wound which I think may require some sharp debridement to try flatness out possibly utilizing chemical cauterization following. 11/23/18 on evaluation today patient actually appears to be doing very well in regard to his sacral ulcer. He seems to be showing signs of improvement with good granulation. With that being said he still has the small area of hyper granulation right in the central portion of the wound which I'm gonna likely utilize silver nitrate on today. Subsequently he also keeps having a leak at the 6 o'clock location which is unfortunate we may be able to help out with some suggestions to try to prevent this going forward. Fortunately there's no signs of active infection at this time. 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 He's been tolerating the dressing changes without complication. Fortunately there's no signs of active infection at this time. I've been rather pleased with how things have progressed there still some evidence of pressure getting to the area with some redness right around the immediate wound opening. Nonetheless other than this I'm not seeing any significant complications or issues the wound is somewhat hyper granular. Upon discussing with the patient and his wife today I'm not sure that the wound is being packed to the base with the foam at this point. And if it's not been packed fully that may be part of the reason why is not seen as much improvement as far as the granulation from the base out. Again we do not want pack too tightly but we need some of the firm to get to the base of the wound. I discussed this with patient and his wife  today. 12/06/18 on evaluation today patient appears to be doing well in regard to his right gluteal pressure ulcer. He's been tolerating the dressing changes without complication. Fortunately there's no signs of active infection. He still has some hyper granular tissue and I do think it would be appropriate to continue with the chemical cauterization as of today. 12/16/18 on evaluation today patient actually appears to be doing okay in regard to his right  gluteal ulcer. He is been tolerating the dressing changes without complication including the Wound VAC. Overall I feel like nothing seems to be worsening I do feel like that the hyper granulation buds in the central portion of the wound have improved to some degree with the silver nitrate. We will have to see how things continue to progress. 12/20/18 on evaluation today patient actually appears to be doing much worse in my pinion even compared to last week's evaluation. Unfortunately as opposed to showing any signs of improvement the areas of hyper granular tissue in the central portion of the wound seem to be getting worse. Subsequently the wound bed itself also seems to be getting deeper even compared to last week which is both unusual as well as concerning since prior he had been shown signs of improvement. Nonetheless I think that the issue could be that he's actually having some difficulty in issues with a deeper infection. There's no external signs of infection but nonetheless I am more worried about the internal, osteomyelitis, that could be restarting. He has not been on antibiotics for some time at this point. I think that it may be a good idea to go ahead and started back on an antibiotic therapy while we wait to see what the testing shows. 12/27/18 on evaluation today patient presents for follow-up concerning his left gluteal fold wound. Fortunately he appears to be doing well today. I did review the CT scan which was negative for any signs of  osteomyelitis or acute abnormality this is excellent news. Overall I feel like the surface of the wound bed appears to be doing significantly better today compared to previously noted findings. There does not appear any signs of infection nor does he have any pain at this time. 01/03/19 on evaluation today patient actually appears to be doing quite well in regard to his ulcer. Post debridement last week he really did not have too much bleeding which is good news. Fortunately today this seems to be doing some better but we still has some of the hyper granular tissue noted in the base of the wound which is gonna require sharp debridement today as well. Overall I'm pleased with how things seem to be progressing since we switched away from the Wound VAC I think he is making some progress. 01/10/19 on evaluation today patient appears to be doing better in regard to his right gluteal fold ulcer. He has been tolerating the dressing changes without complication. The debridement to seem to be helping with current away some of the poor hyper granular tissue bugs throughout the region of his gluteal fold wound. He's been tolerating the dressing changes otherwise without complication which is great news. No fevers, chills, nausea, or vomiting noted at this time. 01/17/19 on evaluation today patient actually appears to be doing excellent in regard to his wound. He's been tolerating the dressing changes without complication. Fortunately there is no signs of active infection at this time which is great news. No fevers, chills, nausea, or vomiting noted at this time. 01/24/19 on evaluation today patient actually appears to be doing quite well with regard to his ulcer. He has been tolerating the dressing changes without complication. Fortunately there's no signs of active infection at this time. Overall been very pleased with the progress that he seems to be making currently. 01/31/19-Patient returns at 1 week with  apparent similarity in dimensions to the wound, with no signs of infection, he has been [place] ([id number]) changing dressings twice  a day 02/08/19 upon evaluation today patient actually appears to be doing well with regard to his right Ischial ulcer. The wound is not appear to be quite as deep and seems to be making progress which is good news. With that being said I'm still reluctant to go back to the Wound VAC at this point. He's been having to change the dressings twice a day which is a little bit much in my pinion from the wound care supplies standpoint. I think that possibly attempting to utilize extras orbit may be beneficial this may also help to prevent any additional breakdown secondary to fluid retention in the wound itself. The patient is in agreement with giving this a try. 02/15/19 on evaluation today patient actually appears to be doing decently well with regard to his ulcer in the right to gluteal fold location. He's been tolerating the dressing changes without complication. Fortunately there is no signs of active infection at this time. He is able to keep the current dressing in place more effectively for a day at a time whereas before he was having a changes to to three times a day. The actions or has been helpful in this regard. Fortunately there's no signs of anything getting worse and I do feel like he showing signs of good improvement with regard to the wound bed status. 02/22/2019 patient appears to be doing very well today with regard to his ulcer in the gluteal fold. Fortunately there is no signs of active infection and he has been tolerating the dressing changes without any complication. Overall extremely pleased with how things seem to be progressing. He has much less of the hyper granular projections within the wound these have slowly been debrided away and he seems to be doing well. The wound bed is more uniform. 03/01/19 on evaluation today patient appears to  be doing unfortunately about the same in regard to his gluteal ulcer. He's been tolerating the dressing changes without complication. Fortunately there's no signs of active infection at this time. With that being said he continues to develop these hyper granular projections which I'm unsure of exactly what they are and why they are rising. Nonetheless I explained to the patient that I do believe it would be a good idea for [place] to stand a biopsy sample for pathology to see if that can shed any light on what exactly may be going on here. Fortunately I do not see any obvious signs of infection. With that being said the patient has had a little bit more drainage this week apparently compared to last week. 03/08/2019 on evaluation today patient actually appears to look somewhat better with regard to the appearance of his wound bed at this time. This is good news. Overall I am very pleased with how things seem to have progressed just in the past week with a switch to the [facility] dressing. I think that has been beneficial for him. With that being said at this time the patient is concerned about his biopsy that I sent off last week unfortunately I do not have that report as of yet. Nonetheless we have called to obtain this and hopefully will hear back from the lab later this morning. 03/15/19 on evaluation today patient's wound actually appears to be doing okay today with regard to the overall appearance of the wound bed. He has been tolerating the dressing changes without complication he still has hyper granular tissue noted but fortunately that seems to be minimal at this point compared  to some of what we've seen in the past. Nonetheless I do think that he is still having some issues currently with some of this type of granulation the biopsy and since all showed nothing more than just evidence of granulation tissue. Therefore there really is nothing different to initiate or do at this point. 03/24/19 on  evaluation today patient appears to be doing a little better with regard to his ulcer. He's been tolerating the dressing changes without complication. Fortunately there is no signs of active infection at this time. No fevers, chills, nausea, or vomiting noted at this time. I'm overall pleased with how things seem to be progressing. 03/29/2019 on evaluation today patient appears to be doing about the same in regard to his ulcer in the right gluteal fold. Unfortunately he is not seeming to make a lot of progress and the wound is somewhat stalled. There is no signs of active infection externally but I am concerned about the possibility of infection continuing in the ischial location which previously he did have infection noted. Again is not able to have an MRI so probably our best option for testing for this would be a triple phase bone scan which will detect subtle changes in the bone more so than plain film x-rays. In the past they really have not been beneficial and in fact the CT scans even have been somewhat questionable at times. Nonetheless there is no signs of systemic infection which is at least good news but again his wound is not healing at all on the predicted schedule. 04/05/19 on evaluation today patient appears to be doing well all things considering with regard to his wound and the right gluteal fold. He actually has his triple bone scan scheduled for sometime in the next couple of days. With that being said I've also been looking to other possibilities of what could be causing hyper granular tissue were looking into the bone scan again for evaluating for the risk or possibility of infection deeper and I'm also gonna go ahead and see about obtaining a deep tissue culture today to send and see if there's any evidence of infection noted on culture. He's in agreement with that plan. 04/12/2019 on evaluation today patient presents for reevaluation here in our clinic concerning ongoing issues with  his right gluteal fold ulcer. 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 It was discussed with him at that point that I think it would be appropriate MATEJ, SPILLER. ([id number]) for him to proceed with hyperbaric oxygen therapy especially in light of the fact that he is previously been on IV antibiotics at the beginning of the year for close to 3 months followed by several weeks of oral antibiotics that was all prescribed by infectious disease. He had surgical debridement around Christmas December 2019 due to an abscess and osteomyelitis of the ischial bone. Unfortunately this has not really proceeded as well as we would have liked and again we did a CT scan even a couple months ago as he cannot have an MRI secondary to having issues with both a pain pump as well as a spinal cord stimulator which prevent him from going into an MRI machine. With that being said there were chronic changes noted at the ischial tuberosity which had progressed since December 2019 there was no evidence of fluid collection on that initial CT scan. With that being said  that was on January 21, 2019. I am not sure that it was a sensitive enough test as compared to an MRI and then subsequently I ordered a bone scan for him which was actually completed on 03/29/2019 and this revealed that he does indeed have positive osteomyelitis involving the right ischial tuberosity. This is adjacent to the ulcer and I think is the reason that his ulcer is not healing. Subsequently I am in a place him back on oral antibiotics today unfortunately his wound culture showed just mixed gram-negative flora with no specific findings of a predominant organism. Nonetheless being with the location I think a good broad-spectrum antibiotic for gram-negative's is a good choice at this point he is previously taken Levaquin without  significant issues and I think that is an appropriate antibiotic for him at this time. Electronic Signature(s) Signed: 04/12/2019 10:05:18 AM By: Worthy Keeler [name] Entered By: Worthy Keeler on 04/12/2019 10:05:18 ISEAH, NORWICK ([id number]) -------------------------------------------------------------------------------- Physical Exam Details Patient Name: [name] Date of Service: 04/12/2019 8:00 AM Medical Record Number: [id number] Patient Account Number: [id number] Date of Birth/Sex: 1951/01/19 (68 y.o. M) Treating RN: Montey Hora Primary Care Provider: [name] Other Clinician: Referring Provider: [name] Treating Provider/Extender: [name], HOYT Weeks in Treatment: 78 Constitutional Well-nourished and well-hydrated in no acute distress. Respiratory normal breathing without difficulty. clear to auscultation bilaterally. Cardiovascular regular rate and rhythm with normal S1, S2. Psychiatric this patient is able to make decisions and demonstrates good insight into disease process. Alert and Oriented x 3. pleasant and cooperative. Notes Patient's wound bed currently actually showed signs of being about the same at this time that really does not seem to be any signs of active infection in fact the measurements were a little bit larger but again the tissue quality is about what I would have expected using the Hydrofera Blue. Again the patient really has not made any progress over the past 6 to 9 months that I have been helping to care for this ulcer especially in the past 6 months. This is despite aggressive wound care up to this point. Electronic Signature(s) Signed: 04/12/2019 10:06:08 AM By: Worthy Keeler [name] Entered By: Worthy Keeler on 04/12/2019 10:06:08 KHIZER, TOEWS ([id number]) -------------------------------------------------------------------------------- Physician Orders Details Patient Name: [name] Date  of Service: 04/12/2019 8:00 AM Medical Record Number: [id number] Patient Account Number: [id number] Date of Birth/Sex: [birth year] (68 y.o. 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 Skin Barriers/Peri-Wound Care Wound #1 Right Gluteal fold o Other: - Apply zinc oxide to peri-wound area. Primary Wound Dressing Wound #1 Right Gluteal fold o Hydrafera Blue Ready Transfer Secondary Dressing Wound #1 Right Gluteal fold o XtraSorb - Secure with Tegaderm Dressing Change Frequency Wound #1 Right Gluteal fold o Change dressing every day. Follow-up Appointments Wound #1 Right Gluteal fold o Return Appointment in 1 week. Off-Loading Wound #1  Right Gluteal fold o Turn and reposition every 2 hours ALANDIS, SAKA ([id number]) Hyperbaric Oxygen Therapy Wound #1 Right Gluteal fold o Evaluate for HBO Therapy o Indication: - Chronic osteomyelitis of the right ischial tuberosity o If appropriate for treatment, begin HBOT per protocol: o 2.0 ATA for 90 Minutes without Air Breaks o One treatment per day (delivered Monday through Friday unless otherwise specified in Special Instructions below): o Total # of Treatments: -  40 Patient Medications Allergies: penicillin, doxycycline, clindamycin Notifications Medication Indication Start End Levaquin DOSE 1 - oral 750 mg tablet - 1 tablet oral taken 1 time a day for 30 days. do not take iron with this medication Electronic Signature(s) Signed: 04/12/2019 10:01:55 AM By: Worthy Keeler [name] Entered By: Worthy Keeler on 04/12/2019 10:01:54 STANELY, [place]APEL ([id number]) -------------------------------------------------------------------------------- Problem List Details Patient Name: [name] Date of Service: 04/12/2019 8:00 AM Medical Record Number: [id number] Patient Account Number: [id number] Date of Birth/Sex: 02-May-1951 (68 y.o. M) Treating RN: Montey Hora Primary Care Provider: [name] Other Clinician: Referring Provider: [name] Treating Provider/Extender: [name], HOYT Weeks in Treatment: 89 Active Problems ICD-10 Evaluated Encounter Code Description Active Date Today Diagnosis L89.314 Pressure ulcer of right buttock, stage 4 11/30/2017 No Yes M86.68 Other chronic osteomyelitis, other site 04/08/2019 No Yes L03.317 Cellulitis of buttock 06/21/2018 No Yes G82.20 Paraplegia, unspecified 11/30/2017 No Yes S34.109S Unspecified injury to unspecified level of lumbar spinal cord, 11/30/2017 No Yes sequela I10 Essential (primary) hypertension 11/30/2017 No Yes Inactive Problems Resolved Problems Electronic Signature(s) Signed: 04/12/2019 8:25:04 AM By: Worthy Keeler [name] Entered By: Worthy Keeler on 04/12/2019 08:25:03 ADDICUS, LANGILLE ([id number]) -------------------------------------------------------------------------------- Progress Note Details Patient Name: [name] Date of Service: 04/12/2019 8:00 AM Medical Record Number: [id number] Patient Account Number: [id number] Date of Birth/Sex: 06-05-1951 (68 y.o. 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 He states that there is no history of diabetes that he is aware of. He does have issues with his prostate and is currently receiving treatment for this by way of oral medication. With that being said I do not have a lot of details in that regard. Nonetheless the patient presents today as a result of having been referred to [place] by another provider initially home health was set to come out and take care of his wound although due to the fact that he apparently drives he's not able to receive home health. His wife is therefore trying to help take care of this wound within although they have been struggling with what exactly to do at this point. She states that she can do some things but she is definitely not a nurse and does have some issues with looking at blood. The good news is the wound does not appear to be too deep and is fairly superficial at this point. There is no slough noted there is some nonviable skin noted around the surface of the wound and the perimeter at this point. The central portion of the wound appears to be very  good with a dermal layer noted this does not appear to be again deep enough to extend it to subcutaneous tissue at this point. 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 In the pump he has Dilaudid and baclofen. 12/07/17 on evaluation today patient presents for follow-up concerning  his ongoing lower back thigh ulcer on the right. He states that he did not get the supplies ordered and therefore has not really been able to perform the dressing changes as directed exactly. His wife was able to get some Boarder Foam Dressing's from the drugstore and subsequently has been using hydrogel which did help to a degree in the wound does appear to be able smaller. There is actually more drainage this week noted than previous. 12/21/17 on evaluation today patient appears to be doing rather well in regard to his right gluteal ulcer. He has been tolerating the dressing changes without complication. There does not appear to be any evidence of infection at this point in time. Overall the wound does seem to be making some progress as far as the edges are concerned there's not as much in the way of overlapping of the external wound edges and he has a good epithelium to wound bed border for the most part. This however is not true right at the 12 o'clock location over the span of a little over a centimeters which actually will require debridement today to clean this away and hopefully allow it to continue to heal more appropriately. 12/28/17 on evaluation today patient appears to be doing rather well in regard to his ulcer in the left gluteal region. He's been tolerating the dressing changes without complication. Apparently he has had some difficulty getting his dressing material. Apparently there's been some confusion with ordering we're gonna check into this. Nonetheless overall he's been showing signs of improvement which is good news. Debridement is not required today. 01/04/18 on evaluation today patient presents for follow-up concerning his right gluteal ulcer. He has been tolerating the dressing changes fairly well. On inspection today it appears he may actually have some maceration them concerned about the fact that he may be developing too much moisture in and around the wound bed which can  cause delay in healing. With that being said he unfortunately really has not showed significant signs of improvement since last week's evaluation in fact this may even be just the little bit/slightly larger. Nonetheless he's been having a lot of discomfort I'm not sure this is even related to the wound as he has no pain when I'm to breeding or otherwise cleaning the wound during evaluation today. Nonetheless this is something that we did recommend he talked to his pain specialist concerning. 01/11/18 on evaluation today patient appears to be doing better in regard to his ulceration. He has been tolerating the dressing Heyer, Zyron J. ([id number]) changes without complication. With that being said overall there's no evidence of infection which is good news. The only thing is he did receive the hatch affair blue classic versus the ready nonetheless I feel like this is perfectly fine and appears to have done well for him over the past week. 01/25/18 on evaluation today patient's wound actually appears to be a little bit larger than during the last evaluation. The good news is the majority of the wound edges actually appear to be fairly firmly attached to the wound bed unfortunately again we're not  really making progress in regard to the size. Roughly the wound is about the same size as when I first saw him although again the wound margin/edges appear to be much better. 02/01/18 on evaluation today patient actually appears to be doing very well in regard to his wound. Applying the Prisma dry does seem to be better although he does still have issues with slow progression of the wound. There was a slight improvement compared to last week's measurements today. Nonetheless I have been considering other options as far as the possibility of Theraskin or even a snap vac. In general I'm not sure that the Theraskin due to location of the wound would be a very good idea. Nonetheless I do think that a snap vac  could be a possibility for the patient and in fact I think this could even be an excellent way to manage the wound possibly seeing some improvement in a very rapid fashion here. Nonetheless this is something that we would need to get approved and I did have a lengthy conversation with the patient about this today. 02/08/18 on evaluation today patient appears to be doing a little better in regard to his ulcer. He has been tolerating the dressing changes without complication. Fortunately despite the fact that the wound is a little bit smaller it's not significantly so unfortunately. We have discussed the possibility of a snap vac we did check with insurance this is actually covered at this point. Fortunately there does not appear to be any sign of infection. Overall I'm fairly pleased with how things seem to be appearing at this point. 02/15/18 on evaluation today patient appears to be doing rather well in regard to his right gluteal ulcer. Unfortunately the snap vac did not stay in place with his sheer and friction this came loose and did not seem to maintain seal very well. He worked for about two days and it did seem to do very well during that time according to his wife but in general this does not seem to be something that's gonna be beneficial for him long-term. I do believe we need to go back to standard dressings to see if we can find something that will be of benefit. 03/02/18- He is here in follow up evaluation; there is minimal change in the wound. He will continue with the same treatment plan, would consider changing to iodosrob/iodoflex if ulcer continues to to plateau. He will follow up next week 03/08/18 on evaluation today patient's wound actually appears to be about the same size as when I previously saw him several weeks back. Unfortunately he does have some slightly dark discoloration in the central portion of the wound which has me concerned about pressure injury. I do believe he may  be sitting for too long a period of time in fact he tells me that "I probably sit for much too long". He does have some Slough noted on the surface of the wound and again as far as the size of the wound is concerned I'm really not seeing anything that seems to have improved significantly. 03/15/18 on evaluation today patient appears to be doing fairly well in regard to his ulcer. The wound measured pretty much about the same today compared to last week's evaluation when looking at his graph. With that being said the area of bruising/deep tissue injury that was noted last week I do not see at this point. He did get a new cushion fortunately this does seem to be have been of  benefit in my pinion. It does appear that he's been off of this more which is good news as well I think that is definitely showing in the overall wound measurements. With that being said I do believe that he needs to continue to offload I don't think that the fact this is doing better should be or is going to allow him to not have to offload and explain this to him as well. Overall he seems to be in agreement the plan I think he understands. The overall appearance of the wound bed is improved compared to last week I think the Iodoflex has been beneficial in that regard. 03/29/18 on evaluation today patient actually appears to be doing rather well in regard to his wound from the overall appearance standpoint he does have some granulation although there's some Slough on the surface of the wound noted as well. With that being said he unfortunately has not improved in regard to the overall measurement of the wound in volume or in size. I did have a discussion with him very specifically about offloading today. He actually does work although he mainly is just sitting throughout the day. He tells me he offloads by "lifting himself up for 30 seconds off of his chair occasionally" purchase from [employer] which does seem to have helped.  And he has a new cushion that he with that being said he's also able to stand some for a very short period of time but not significant enough I think to provide appropriate offloading. I think the biggest issue at this point with the wound and the fact is not healing as quickly as we would like is due to the fact that he is really not able to appropriately offload while at work. He states the beginning after his injury he actually had a bed at his job that he could lay on in order to offload and that does seem to have been of help back at that time. Nonetheless he had not done this in quite some time unfortunately. I think that could be helpful for him this is something I would like for him to look into. KAILI, ZINDEL ([id number]) 04/05/18 on evaluation today patient actually presents for follow-up concerning his right gluteal ulcer. Again he really is not significantly improved even compared to last week. He has been tolerating the dressing changes without complication. With that being said fortunately there appears to be no evidence of infection at this time. He has been more proactive in trying to offload. 04/12/18 on evaluation today patient actually appears to be doing a little better in regard to his wound and the right gluteal fold region. He's been tolerating the dressing changes since removing the oasis without complication. However he was having a lot of burning initially with the oasis in place. He's unsure of exactly why this was given so much discomfort but he assumes that it was the oasis itself causing the problem. Nonetheless this had to be removed after about three days in place although even those three days seem to have made a fairly good improvement in regard to the overall appearance of the wound bed. In fact is the first time that he's made any improvement from the standpoint of measurements in about six weeks. He continues to have no discomfort over the area of the wound  itself which leads me to wonder why he was having the burning with the oasis when he does not even feel the actual debridement's themselves. I  am somewhat perplexed by this. 04/19/18 on evaluation today patient's wound actually appears to be showing signs of epithelialization around the edge of the wound and in general actually appears to be doing better which is good news. He did have the same burning after about three days with applying the Endoform last week in the same fashion that I would generally apply a skin substitute. This seems to indicate that it's not the oasis to cause the problem but potentially the moisture buildup that just causes things to burn or there may be some other reaction with the skin prep or Steri-Strips. Nonetheless I'm not sure that is gonna be able to tolerate any skin substitute for a long period of time. The good news is the wound actually appears to be doing better today compared to last week and does seem to finally be making some progress. 04/26/18 on evaluation today patient actually appears to be doing rather well in regard to his ulcer in the right gluteal fold. He has been tolerating the dressing changes without complication which is good news. The Endoform does seem to be helping although he was a little bit more macerated this week. This seems to be an ongoing issue with fluid control at this point. Nonetheless I think we may be able to add something like Drawtex to help control the drainage. 05/03/18 on evaluation today patient appears to actually be doing better in regard to the overall appearance of his wound. He has been tolerating the dressing changes without complication. Fortunately there appears to be no evidence of infection at this time. I really feel like his wound has shown signs as of today of turning around last week I thought so as well and definitely he could be seen in this week's overall appearance and measurements. In general I'm very pleased  with the fact that he finally seems to be making a steady but sure progress. The patient likewise is very pleased. 05/17/18 on evaluation today patient appears to be doing more poorly unfortunately in regard to his ulcer. He has been tolerating the dressing changes without complication. With that being said he tells me that in the past couple of days he and his wife have noticed that we did not seem to be doing quite as well is getting dark near the center. Subsequently upon evaluation today the wound actually does appear to be doing worse compared to previous. He has been tolerating the dressing changes otherwise and he states that he is not been sitting up anymore than he was in the past from what he tells me. Still he has continued to work he states "I'm tired of dealing with this and if I have to just go home and lay in the bed all the time that's what I'll do". Nonetheless I am concerned about the fact that this wound does appear to be deeper than what it was previous. 05/24/18 upon evaluation today patient actually presents after having been in the hospital due to what was presumed to be sepsis secondary to the wound infection. He had an elevated white blood cell count between 14 and 15. With that being said he does seem to be doing somewhat better now. His wound still is giving him some trouble nonetheless and he is obviously concerned about the fact likely talked about that this does seem to go more deeply than previously noted. I did review his wound culture which showed evidence of Staphylococcus aureus him and group B strep. Nonetheless he is on  antibiotics, Levaquin, for this. Subsequently I did review his intake summary from the hospital as well. I also did look at the CT of the lumbar spine with contrast that was performed which showed no bone destruction to suggest lumbar disguises/osteomyelitis or sacral osteomyelitis. There was no paraspinal abscess. Nonetheless it appears this may  have been more of just a soft tissue infection at this point which is good news. He still is nonetheless concerned about the wound which again I think is completely reasonable considering everything he's been through recently. 05/31/18 on evaluation today on evaluation today patient actually appears to be showing signs of his wound be a little bit deeper than what I would like to see. Fortunately he does not show any signs of significant infection although his temperature was 99 today he states he's been checking this at home and has not been elevated. Nonetheless with the undermining that I'm seeing at this point I am becoming more concerned about the wound I do think that offloading is a key factor here that is preventing the speedy recovery at this point. There does not appear to be any evidence of again over infection noted. He's been using Santyl currently. MICHAELL, IGLEHEART ([id number]) 06/07/18 the patient presents today for follow-up evaluation regarding the left ulcer in the gluteal region. He has been tolerating the Wound VAC fairly well. He is obviously very frustrated with this he states that to mean is really getting in his way. There does not appear to be any evidence of infection at this time he does have a little bit of odor I do not necessarily associate this with infection just something that we sometimes notice with Wound VAC therapy. With that being said I can definitely catch a tone of discontentment overall in the patient's demeanor today. This when he was previously in the hospital an CT scan was done of the lumbar region which did not reveal any signs of osteomyelitis. With that being said the pelvis in particular was not evaluated distinctly which means he could still have some osteonecrosis I. Nonetheless the Wound VAC was started on Thursday I do want to get this little bit more time before jumping to a CT scan of the pelvis although that is something that I might would  recommend if were not see an improvement by that time. 06/14/18 on evaluation today patient actually appears to be doing about the same in regard to his right gluteal ulcer. Again he did have a CT scan of the lumbar spine unfortunately this did not include the pelvis. Nonetheless with the depth of the wound that I'm seeing today even despite the fact that I'm not seeing any evidence of overt cellulitis I believe there's a good chance that we may be dealing with osteomyelitis somewhere in the right Ischial region. No fevers, chills, nausea, or vomiting noted at this time. 06/21/18 on evaluation today patient actually appears to be doing about the same with regard to his wound. The tunnel at 6 o'clock really does not appear to be any deeper although it is a little bit wider. I think at this point you may want to start packing this with white phone. Unfortunately I have not got approval for the CT scan of the pelvis as of yet due to the fact that Medicare apparently has been denied it due to the diagnosis codes not being appropriate according to Medicare for the test requested. With that being said the patient cannot have an MRI and therefore this  is the only option that we have as far as testing is concerned. The patient has had infection and was on antibiotics and been added code for cellulitis of the bottom to see if this will be appropriate for getting the test approved. Nonetheless I'm concerned about the infection have been spread deeper into the Ischial region. 06/28/18 on evaluation today patient actually appears to be doing rather well all things considered in regard to the right gluteal ulcer. He has been tolerating the dressing changes without complication. With that being said the Wound VAC he states does have to be replaced almost every day or at least reinforced unfortunately. Patient actually has his CT scan later this morning we should have the results by tomorrow. 07/05/18 on evaluation  today patient presents for follow-up concerning his right Ischial ulcer. He did see the surgeon Dr. Lysle Pearl last week. They were actually very happy with him and felt like he spent a tremendous amount of time with them as far as discussing his situation was concerned. In the end Dr. Lysle Pearl did contact me as well and determine that he would not recommend any surgical intervention at this point as he felt like it would not be in the patient's best interest based on what he was seeing. He recommended a referral to infectious disease. Subsequently this is something that Dr. Ines Bloomer office is working on setting up for the patient. As far as evaluation today is concerned the patient's wound actually appears to be worse at this point. I am concerned about how things are progressing and specifically about infection. I do not feel like it's the deeper but the area of depth is definitely widening which does have me concerned. No fevers, chills, nausea, or vomiting noted at this time. I think that we do need initiate antibiotic therapy the patient has an allow allergy to amoxicillin/penicillin he states that he gets a rash since childhood. Nonetheless she's never had the issues with Catholics or cephalosporins in general but he is aware of. 07/27/18 on evaluation today patient presents following admission to the hospital on 07/09/18. He was subsequently discharged on 07/20/18. On 07/15/18 the patient underwent irrigation and debridement was soft tissue biopsy and bone biopsy as well as placement of a Wound VAC in the OR by Dr. Celine Ahr. During the hospital course the patient was placed on a Wound VAC and recommended follow up with surgery in three weeks actually with Dr. Delaine Lame who is infectious disease. The patient was on vancomycin during the hospital course. 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 He also had a bone culture which revealed evidence of methicillin-resistant  staph aureus. He is updated CT scan 07/09/18 reveals that he had progression of the which was performed on wound to breakdown down to the trochanter where he actually had irregularities there as well suggestive of osteomyelitis. This was a change just since 9 December when we last performed a CT scan. Obviously this one had gone downhill quite significantly and rapidly. At this point upon evaluation I feel like in general the patient's wound seems to be doing fairly well all things considered upon my evaluation today. Obviously this is larger and deeper than what I previously evaluated but at the same time he seems to be making some progress as far as the appearance of the granulation tissue is concerned. I'm happy in that regard. No fevers, chills, nausea, or vomiting noted at this time. He is on IV vancomycin and Rocephin at  the facility. He is currently in [facility]. 08/03/18 upon evaluation today patient's wound appears to be doing better in regard to the overall appearance at this point in time. Fortunately he's been tolerating the Wound VAC without complication and states that the facility has been taking ELMOR, BONFANTE. ([id number]) excellent care of the wound site. Overall I see some Slough noted on the surface which I am going to attempt sharp debridement today of but nonetheless other than this I feel like he's making progress. 08/09/18 on evaluation today patient's wound appears to be doing much better compared to even last week's evaluation. Do believe that the Wound VAC is been of great benefit for him. He has been tolerating the dressing changes that is the Wound VAC without any complication and he has excellent granulation noted currently. There is no need for sharp debridement at this point. 08/16/18 on evaluation today patient actually appears to be doing very well in regard to the wound in the right gluteal fold region. This is showing signs of progress and again  appears to be very healthy which is excellent news. Fortunately there is no sign of active infection by way of odor or drainage at this point. Overall I'm very pleased with how things stand. He seems to be tolerating the Wound VAC without complication. 08/23/18 on evaluation today patient actually appears to be doing better in regard to his wound. He has been tolerating the Wound VAC without complication and in fact it has been collecting a significant amount of drainage which I think is good news especially considering how the wound appears. Fortunately there is no signs of infection at this time definitely nothing appears to be worse which is good news. He has not been started on the Bactrim and Flagyl that was recommended by Dr. Delaine Lame yet. I did actually contact her office this morning in order to check and see were things are that regard their gonna be calling me back. 08/30/18 on evaluation today patient actually appears to show signs of excellent improvement today compared to last evaluation. The undermining is getting much better the wound seems to be feeling quite nicely and I'm very pleased that the granulation in general. With that being said overall I feel like the patient has made excellent progress which is great news. No fevers, chills, nausea, or vomiting noted at this time. 09/06/18 on evaluation today patient actually appears to be doing rather well in regard to his right gluteal ulcer. This is showing signs of improvement in overall I'm very pleased with how things seem to be progressing. The patient likewise is please. Overall I see no evidence of infection he is about to complete his oral antibiotic regimen which is the end of the antibiotics for him in just about three days. 09/13/18 on evaluation today patient's right Ischial ulcer appears to be showing signs of continued improvement which is excellent news. He's been tolerating the dressing changes without complication.  Fortunately there's no signs of infection and the wound that seems to be doing very well. 09/28/18 on evaluation today patient appears to be doing rather well in regard to his right Ischial ulcer. He's been tolerating the Wound VAC without complication he knows there's much less drainage than there used to be this obviously is not a bad thing in my pinion. There's no evidence of infection despite the fact is but nothing about it now for several weeks. 10/04/18 on evaluation today patient appears to be doing better in regard to  his right Ischial wound. He has been tolerating the Wound VAC without complication and I do believe that the silver nitrate last week was beneficial for him. Fortunately overall there's no evidence of active infection at this time which is great news. No fevers, chills, nausea, or vomiting noted at this time. 10/11/18 on evaluation today patient actually appears to be doing rather well in regard to his Ischial ulcer. He's been tolerating the Wound VAC still without complication I feel like this is doing a good job. No fevers, chills, nausea, or vomiting noted at this time. 11/01/18 on evaluation today patient presents after having not been seen in our clinic for several weeks secondary to the fact that he was on evaluation today patient presents after having not been seen in our clinic for several weeks secondary to the fact that he was in a skilled nursing facility which was on lockdown currently due to the covert 19 national emergency. Subsequently he was discharged from the facility on this past Friday and subsequently made an appointment to come in to see yesterday. Fortunately there's no signs of active infection at this time which is good news and overall he does seem to have made progress since I last saw. Overall I feel like things are progressing quite nicely. The patient is having no pain. 11/08/18 on evaluation today patient appears to be doing okay in regard to his  right gluteal ulcer. He has been utilizing a Wound VAC home health this changing this at this point since he's home from the skilled nursing facility. Fortunately there's no signs of obvious active infection at this time. Unfortunately though there's no obvious active infection he is having some maceration and his wife states that when the sheets of the Wound VAC office on Sunday when it broke seal that he ended up having significant issues with some smell as well there concerned about the possibility of infection. Fortunately there's No fevers, chills, nausea, or vomiting noted at this time. GIOVANNE, LINNANE ([id number]) 11/15/18 on evaluation today patient actually appears to be doing well in regard to his right gluteal ulcer. He has been tolerating the dressing changes without complication. Specifically the Wound VAC has been utilized up to this point. Fortunately there's no signs of infection and overall I feel like he has made progress even since last week when I last saw him. I'm actually fairly happy with the overall appearance although he does seem to have somewhat of a hyper granular overgrowth in the central portion of the wound which I think may require some sharp debridement to try flatness out possibly utilizing chemical cauterization following. 11/23/18 on evaluation today patient actually appears to be doing very well in regard to his sacral ulcer. He seems to be showing signs of improvement with good granulation. With that being said he still has the small area of hyper granulation right in the central portion of the wound which I'm gonna likely utilize silver nitrate on today. Subsequently he also keeps having a leak at the 6 o'clock location which is unfortunate we may be able to help out with some suggestions to try to prevent this going forward. Fortunately there's no signs of active infection at this time. 11/29/18 on evaluation today patient actually appears to be doing quite  well in regard to his pressure ulcer in the right gluteal fold region. He's been tolerating the dressing changes without complication. Fortunately there's no signs of active infection at this time. I've been rather pleased with how  things have progressed there still some evidence of pressure getting to the area with some redness right around the immediate wound opening. Nonetheless other than this I'm not seeing any significant complications or issues the wound is somewhat hyper granular. Upon discussing with the patient and his wife today I'm not sure that the wound is being packed to the base with the foam at this point. And if it's not been packed fully that may be part of the reason why is not seen as much improvement as far as the granulation from the base out. Again we do not want pack too tightly but we need some of the firm to get to the base of the wound. I discussed this with patient and his wife today. 12/06/18 on evaluation today patient appears to be doing well in regard to his right gluteal pressure ulcer. He's been tolerating the dressing changes without complication. Fortunately there's no signs of active infection. He still has some hyper granular tissue and I do think it would be appropriate to continue with the chemical cauterization as of today. 12/16/18 on evaluation today patient actually appears to be doing okay in regard to his right gluteal ulcer. He is been tolerating the dressing changes without complication including the Wound VAC. Overall I feel like nothing seems to be worsening I do feel like that the hyper granulation buds in the central portion of the wound have improved to some degree with the silver nitrate. We will have to see how things continue to progress. 12/20/18 on evaluation today patient actually appears to be doing much worse in my pinion even compared to last week's evaluation. Unfortunately as opposed to showing any signs of improvement the areas of hyper  granular tissue in the central portion of the wound seem to be getting worse. Subsequently the wound bed itself also seems to be getting deeper even compared to last week which is both unusual as well as concerning since prior he had been shown signs of improvement. Nonetheless I think that the issue could be that he's actually having some difficulty in issues with a deeper infection. There's no external signs of infection but nonetheless I am more worried about the internal, osteomyelitis, that could be restarting. He has not been on antibiotics for some time at this point. I think that it may be a good idea to go ahead and started back on an antibiotic therapy while we wait to see what the testing shows. 12/27/18 on evaluation today patient presents for follow-up concerning his left gluteal fold wound. Fortunately he appears to be doing well today. I did review the CT scan which was negative for any signs of osteomyelitis or acute abnormality this is excellent news. Overall I feel like the surface of the wound bed appears to be doing significantly better today compared to previously noted findings. There does not appear any signs of infection nor does he have any pain at this time. 01/03/19 on evaluation today patient actually appears to be doing quite well in regard to his ulcer. Post debridement last week he really did not have too much bleeding which is good news. Fortunately today this seems to be doing some better but we still has some of the hyper granular tissue noted in the base of the wound which is gonna require sharp debridement today as well. Overall I'm pleased with how things seem to be progressing since we switched away from the Wound VAC I think he is making some progress. 01/10/19 on  evaluation today patient appears to be doing better in regard to his right gluteal fold ulcer. He has been tolerating the dressing changes without complication. The debridement to seem to be helping with  current away some of the poor hyper granular tissue bugs throughout the region of his gluteal fold wound. He's been tolerating the dressing changes otherwise without complication which is great news. No fevers, chills, nausea, or vomiting noted at this time. 01/17/19 on evaluation today patient actually appears to be doing excellent in regard to his wound. He's been tolerating the dressing changes without complication. Fortunately there is no signs of active infection at this time which is great news. No fevers, chills, nausea, or vomiting noted at this time. YADEL, MILLERICK ([id number]) 01/24/19 on evaluation today patient actually appears to be doing quite well with regard to his ulcer. He has been tolerating the dressing changes without complication. Fortunately there's no signs of active infection at this time. Overall been very pleased with the progress that he seems to be making currently. 01/31/19-Patient returns at 1 week with apparent similarity in dimensions to the wound, with no signs of infection, he has been changing dressings twice a day 02/08/19 upon evaluation today patient actually appears to be doing well with regard to his right Ischial ulcer. The wound is not appear to be quite as deep and seems to be making progress which is good news. With that being said I'm still reluctant to go back to the Wound VAC at this point. He's been having to change the dressings twice a day which is a little bit much in my pinion from the wound care supplies standpoint. I think that possibly attempting to utilize extras orbit may be beneficial this may also help to prevent any additional breakdown secondary to fluid retention in the wound itself. The patient is in agreement with giving this a try. 02/15/19 on evaluation today patient actually appears to be doing decently well with regard to his ulcer in the right to gluteal fold location. He's been tolerating the dressing changes without  complication. Fortunately there is no signs of active infection at this time. He is able to keep the current dressing in place more effectively for a day at a time whereas before he was having a changes to to three times a day. The actions or has been helpful in this regard. Fortunately there's no signs of anything getting worse and I do feel like he showing signs of good improvement with regard to the wound bed status. 02/22/2019 patient appears to be doing very well today with regard to his ulcer in the gluteal fold. Fortunately there is no signs of active infection and he has been tolerating the dressing changes without any complication. Overall extremely pleased with how things seem to be progressing. He has much less of the hyper granular projections within the wound these have slowly been debrided away and he seems to be doing well. The wound bed is more uniform. 03/01/19 on evaluation today patient appears to be doing unfortunately about the same in regard to his gluteal ulcer. He's been tolerating the dressing changes without complication. Fortunately there's no signs of active infection at this time. With that being said he continues to develop these hyper granular projections which I'm unsure of exactly what they are and why they are rising. Nonetheless I explained to the patient that I do believe it would be a good idea for [place] to stand a biopsy sample for pathology to see  if that can shed any light on what exactly may be going on here. Fortunately I do not see any obvious signs of infection. With that being said the patient has had a little bit more drainage this week apparently compared to last week. 03/08/2019 on evaluation today patient actually appears to look somewhat better with regard to the appearance of his wound bed at this time. This is good news. Overall I am very pleased with how things seem to have progressed just in the past week with a switch to the [facility] dressing. I  think that has been beneficial for him. With that being said at this time the patient is concerned about his biopsy that I sent off last week unfortunately I do not have that report as of yet. Nonetheless we have called to obtain this and hopefully will hear back from the lab later this morning. 03/15/19 on evaluation today patient's wound actually appears to be doing okay today with regard to the overall appearance of the wound bed. He has been tolerating the dressing changes without complication he still has hyper granular tissue noted but fortunately that seems to be minimal at this point compared to some of what we've seen in the past. Nonetheless I do think that he is still having some issues currently with some of this type of granulation the biopsy and since all showed nothing more than just evidence of granulation tissue. Therefore there really is nothing different to initiate or do at this point. 03/24/19 on evaluation today patient appears to be doing a little better with regard to his ulcer. He's been tolerating the dressing changes without complication. Fortunately there is no signs of active infection at this time. No fevers, chills, nausea, or vomiting noted at this time. I'm overall pleased with how things seem to be progressing. 03/29/2019 on evaluation today patient appears to be doing about the same in regard to his ulcer in the right gluteal fold. Unfortunately he is not seeming to make a lot of progress and the wound is somewhat stalled. There is no signs of active infection externally but I am concerned about the possibility of infection continuing in the ischial location which previously he did have infection noted. Again is not able to have an MRI so probably our best option for testing for this would be a triple phase bone scan which will detect subtle changes in the bone more so than plain film x-rays. In the past they really have not been beneficial and in fact the CT scans even  have been somewhat questionable at times. Nonetheless there is no signs of systemic infection which is at least good news but again his wound is not healing at all on the predicted schedule. 04/05/19 on evaluation today patient appears to be doing well all things considering with regard to his wound and the right gluteal fold. He actually has his triple bone scan scheduled for sometime in the next couple of days. With that being said I've also been looking to other possibilities of what could be causing hyper granular tissue were looking into the bone scan again for evaluating for the risk or possibility of infection deeper and I'm also gonna go ahead and see about obtaining a deep tissue Carano, Chamar J. ([id number]) culture today to send and see if there's any evidence of infection noted on culture. He's in agreement with that plan. 04/12/2019 on evaluation today patient presents for reevaluation here in our clinic concerning ongoing issues with his  right gluteal fold ulcer. 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 It was discussed with him at that point that I think it would be appropriate for him to proceed with hyperbaric oxygen therapy especially in light of the fact that he is previously been on IV antibiotics at the beginning of the year for close to 3 months followed by several weeks of oral antibiotics that was all prescribed by infectious disease. He had surgical debridement around Christmas December 2019 due to an abscess and osteomyelitis of the ischial bone. Unfortunately this has not really proceeded as well as we would have liked and again we did a CT scan even a couple months ago as he cannot have an MRI secondary to having issues with both a pain pump as well as a spinal cord stimulator which prevent him from going into an MRI machine. With that being said there  were chronic changes noted at the ischial tuberosity which had progressed since December 2019 there was no evidence of fluid collection on that initial CT scan. With that being said that was on January 21, 2019. I am not sure that it was a sensitive enough test as compared to an MRI and then subsequently I ordered a bone scan for him which was actually completed on 03/29/2019 and this revealed that he does indeed have positive osteomyelitis involving the right ischial tuberosity. This is adjacent to the ulcer and I think is the reason that his ulcer is not healing. Subsequently I am in a place him back on oral antibiotics today unfortunately his wound culture showed just mixed gram-negative flora with no specific findings of a predominant organism. Nonetheless being with the location I think a good broad-spectrum antibiotic for gram-negative's is a good choice at this point he is previously taken Levaquin without significant issues and I think that is an appropriate antibiotic for him at this time. Patient History Information obtained from Patient. Allergies penicillin (Reaction: Rash), doxycycline (Reaction: rash), clindamycin Family History Hypertension - Father, Stroke - Mother, No family history of Cancer, Diabetes, Heart Disease, Kidney Disease, Lung Disease, Seizures, Thyroid Problems, Tuberculosis. Social History Never smoker, Marital Status - Married, Alcohol Use - Never, Drug Use - No History, Caffeine Use - Daily. Medical History Eyes Patient has history of Cataracts - both removed Denies history of Glaucoma, Optic Neuritis Ear/Nose/Mouth/Throat Denies history of Chronic sinus problems/congestion, Middle ear problems Hematologic/Lymphatic Denies history of Anemia, Hemophilia, Human Immunodeficiency Virus, Lymphedema Respiratory Denies history of Aspiration, Asthma, Chronic Obstructive Pulmonary Disease (COPD), Pneumothorax, Sleep Apnea, Tuberculosis Cardiovascular Patient has  history of Hypertension - takes medication Denies history of Angina, Arrhythmia, Congestive Heart Failure, Coronary Artery Disease, Deep Vein Thrombosis, Hypotension, Myocardial Infarction, Peripheral Arterial Disease, Peripheral Venous Disease, Phlebitis, Vasculitis Gastrointestinal Denies history of Cirrhosis , Colitis, Crohn s, Hepatitis A, Hepatitis B, Hepatitis C Endocrine Denies history of Type I Diabetes, Type II Diabetes Genitourinary Denies history of End Stage Renal Disease Immunological Denies history of Lupus Erythematosus, Raynaud s, Scleroderma Integumentary (Skin) Gudgel, Jeris J. ([id number]) Denies history of History of Burn, History of pressure wounds Musculoskeletal Denies history of Gout, Rheumatoid Arthritis, Osteoarthritis, Osteomyelitis Neurologic Patient has history of Paraplegia - waist down Denies history of Dementia, Neuropathy, Quadriplegia, Seizure Disorder Oncologic Denies history of Received Chemotherapy, Received Radiation Psychiatric Denies history of Anorexia/bulimia, Confinement Anxiety Medical And Surgical History Notes Oncologic Prostate cancer- currently treated with  horomone therapy Review of Systems (ROS) Constitutional Symptoms (General Health) Denies complaints or symptoms of Fatigue, Fever, Chills, Marked Weight Change. Respiratory Denies complaints or symptoms of Chronic or frequent coughs, Shortness of Breath. Cardiovascular Denies complaints or symptoms of Chest pain, LE edema. Psychiatric Denies complaints or symptoms of Anxiety, Claustrophobia. Objective Constitutional Well-nourished and well-hydrated in no acute distress. Vitals Time Taken: 8:18 AM, Height: 73 in, Weight: 210 lbs, BMI: 27.7, Temperature: 99.1 F, Pulse: 72 bpm, Respiratory Rate: 16 breaths/min, Blood Pressure: 132/82 mmHg. Respiratory normal breathing without difficulty. clear to auscultation bilaterally. Cardiovascular regular rate and rhythm with normal  S1, S2. Psychiatric this patient is able to make decisions and demonstrates good insight into disease process. Alert and Oriented x 3. pleasant and cooperative. General Notes: Patient's wound bed currently actually showed signs of being about the same at this time that really does not seem to be any signs of active infection in fact the measurements were a little bit larger but again the tissue quality is about what I would have expected using the Hydrofera Blue. Again the patient really has not made any progress over the past 6 to 9 months that I have been helping to care for this ulcer especially in the past 6 months. This is despite aggressive wound care up to this point. Integumentary (Hair, Skin) Mcnay, Jakari J. ([id number]) Wound #1 status is Open. Original cause of wound was Pressure Injury. The wound is located on the Right Gluteal fold. The wound measures 4cm length x 2.4cm width x 2.8cm depth; 7.54cm^2 area and 21.112cm^3 volume. There is muscle and Fat Layer (Subcutaneous Tissue) Exposed exposed. There is no undermining noted, however, there is tunneling at 12:00 with a maximum distance of 4cm. There is a large amount of serosanguineous drainage noted. The wound margin is epibole. There is medium (34-66%) red, pink, hyper - granulation within the wound bed. There is a medium (34-66%) amount of necrotic tissue within the wound bed including Adherent Slough. 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 He does have osteomyelitis of the right ischial tuberosity and has previously undergone surgery  in December 2019 right around Christmas time July 15, 2018 when he had surgical debridement and evacuation of an abscess. Site of CRO Right Ischial Tuberosity Is the site of the osteomyelitis and he has an adjacent ulcer in the right gluteal fold location which is nonhealing. Imaging studies to support CRO Patient has undergone a biopsy during the surgical debridement on December 26. That report which was resulted on 07/16/2018 revealed at that time chronic osteomyelitis and inflamed granulation tissue no definitive changes of chronic osteomyelitis and was negative for malignancy. This was the initial documentation of osteomyelitis confirmation. Subsequently I did perform a repeat triple phase bone scan in order to evaluate for osteomyelitis due to the nonhealing wound and this was resulted on 04/06/2019 which shows positive findings for osteomyelitis involving the right Ischial tuberosity. Again this is the same site and does confirm chronic refractory osteomyelitis. And again this is at the exact site of where the patient's wound is located. Antibiotic outcome With regard to the patient's antibiotic therapy he was initially in the hospital from 12/20 through 07/20/2018  secondary to his ulcer and subsequent surgery/bone biopsy. Subsequently he was started on IV vancomycin and ceftriaxone on 07/15/2018 and initially this was recommended for 6 weeks by Dr. Steva Ready. That was initially scheduled to be completed on 08/18/2018. Dr. Steva Ready actually came to see the patient here in our clinic on 08/16/2018 and based on the fact that the patient seemed BERTIN, CACCIATORE. ([id number]) to be doing so well visually as well as with regard to lab studies she recommended 2 weeks of oral Bactrim and Flagyl. This was directly discussed with me and again I documented this in the note dated 08/16/2018. Those new orders began on August 19, 2018 and all those records are actually in the skilled nursing  facility where the patient was residing at that point. He did complete the full 2 weeks of antibiotic therapy orally as well. Currently as of 04/12/2019 I have actually gone ahead and did oral Levaquin 750 mg based on the gram-negative results I saw on the patient's wound culture although no specific organism was noted. This is a good broad-spectrum option for the wound at this point. The patient has been referred back to IV infectious disease, Dr. Steva Ready, although that has not been scheduled as of yet he still waiting to hear from their office. That referral was placed on 04/08/2019. Surgical resection outcome Patient actually had irrigation and debridement of a full-thickness skin ulcer with bone biopsy and soft tissue biopsy for a total wound area 49 cm performed on 07/15/2018 by Dr. Fredirick Maudlin. The patient also had a pocket of fluid collection that was debrided down to and then she curetted over the bone as well to clear away any necrotic bone from the surface of the wound. 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 The area was packed with Dakin soaked gauze and the patient was discharged from the surgery unit. This is the point at which vancomycin was initiated for her prior to infectious disease becoming involved per the note above. Bone scan results Patient just had a bone scan repeated on 04/06/2019 due to the nonhealing status of his wound which revealed that he does have positive findings for osteomyelitis in the right ischial tuberosity which is the exact location that we previously noted that the patient had osteomyelitis for which she was treated from July 15, 2018 through September 03, 2018. Unfortunately the wound has been nonhealing in the interim from then until now despite multiple attempts at wound care measures. Thus due to the fact that it was not healing I did order the repeat bone scan to evaluate for  any chance of osteomyelitis and this turned back positive. Obviously this is a chronic and refractory issue with regard to the osteomyelitis. This also is directly preventing the wound from being able to heal and causing hyper granular tissue which again is impeding wound progression. Labs reviewed Patient's wound culture which was obtained as a tissue sample though I was not able to access bone was noted on 04/08/2019 did show mixed gram-negative flora which is stated to be most likely contamination with fecal flora and for that reason I did place the patient on Levaquin when I saw him on 04/12/2019. Patient's CBC was also evaluated and was actually within normal limits which is good there was no white blood cell count elevation in his hemoglobin was 13.6 which is slightly low in regard to him being anemic he is typically on iron however.  I am going to have to have him hold the iron while on the Levaquin so it does not interfere with the functioning of the medication. Plan of care/Summary At this time my suggestion for the patient is good to be as follows. Initially I am referring him back to infectious disease for consideration of IV antibiotic therapy considering this is a chronic refractory osteomyelitis. In the interim until he can see infectious disease as we will still awaiting an appointment there I did place him back on Levaquin 500 mg which I think is going to be appropriate in helping to treat any gram-negative organisms which is cultures somewhat alluded to as being present in the wound. Due to the location of the wound obviously this is a very common site for fecal contamination which makes sense. Secondly I am also going to proceed as well with a Hydrofera Blue dressing that seems to have helped as well as anything as far as helping to even out some of the hyper granulation although I think the majority of the hyper granulation is actually emanating from the fact that he has an ongoing  infection at this site causing issues. I think we need to get the infection under control. Interval to this I believe that hyperbaric oxygen therapy will be very beneficial and that again it can help resolve the infection and allow for more effective healing of the wound. He has been through the IV antibiotics in the past and though it helped for a time obviously it did not completely eliminate the infection at this bone site. That is the reason I want to include hyperbarics as part of the treatment plan. Thirdly I am going to suggest as well continued offloading the patient's been doing this very well and I think that is still very important as far as his ongoing treatment is concerned. He has MASARU, HALER. ([id number]) been extremely compliant with everything that I recommended. Fourthly we have also obtained approval for a skin substitute, Primatrix, but right now I am holding off on that as he has active infection once we get the infection cleared I think this could be a possibility I am also considering reinitiating the wound VAC but again all this is dependent on getting the infection under control hence the reason I want to go ahead and see about initiating a combination of hyperbaric oxygen therapy along with appropriate antibiotic therapy to try to get this infection under control ASAP and the wound healing appropriately. With regard to measurable goals I am looking for improvement of the granulation tissue to show signs of less hyper granulation (Which is caused often by infection and I think that is the cause of the hyper granulation in this patient) and more confluent/even granulation which will allow the wound bed to actually heal more effectively. I am also looking for the wound to do diminish in size compared to where it stands currently both in Size and depth although depth is the key factor that I am looking for mostly as I do not expect the size to dramatically diminish with  regard to external measurements until this wound feels in. Lastly I am also looking for the wound to begin to drain significantly less compared to what it has been as I believe he is having a lot of drainage and discharge secondary to the infection and again as that improves as the infection improves I think that he will also have improvement in the amount of discharge that he is  noting from the wound. Plan Wound Cleansing: Wound #1 Right Gluteal fold: Other: - Cleanse wound with Dakin's Solution Anesthetic (add to Medication List): Wound #1 Right Gluteal fold: Topical Lidocaine 4% cream applied to wound bed prior to debridement (In Clinic Only). Skin Barriers/Peri-Wound Care: Wound #1 Right Gluteal fold: Other: - Apply zinc oxide to peri-wound area. Primary Wound Dressing: Wound #1 Right Gluteal fold: Hydrafera Blue Ready Transfer Secondary Dressing: Wound #1 Right Gluteal fold: XtraSorb - Secure with Tegaderm Dressing Change Frequency: Wound #1 Right Gluteal fold: Change dressing every day. Follow-up Appointments: Wound #1 Right Gluteal fold: Return Appointment in 1 week. Off-Loading: Wound #1 Right Gluteal fold: Turn and reposition every 2 hours Hyperbaric Oxygen Therapy: Wound #1 Right Gluteal fold: Evaluate for HBO Therapy Indication: - Chronic osteomyelitis of the right ischial tuberosity If appropriate for treatment, begin HBOT per protocol: 2.0 ATA for 90 Minutes without Air Breaks One treatment per day (delivered Monday through Friday unless otherwise specified in Special Instructions below): Total # of Treatments: - 40 The following medication(s) was prescribed: Levaquin oral 750 mg tablet 1 1 tablet oral taken 1 time a day for 30 days. do not take iron with this medication DAREY, WEAKLEY. ([id number]) 1. I would recommend at this time that we continue with the [facility] dressing patients in agreement with this plan. 2. I am also going to go ahead and  initiate treatment with Levaquin 750 mg daily and I will have the patient hold his iron in order to see if we can get some of the infection under better control hopefully improving the tissue quality and see in this wound heal more effectively. The patient is in agreement with that plan. Fortunately there is no signs of systemic infection at this time which is good news. 3. I am going also submit for approval for hyperbaric oxygen therapy for the patient I think this would be definitely beneficial especially in light of the chronic refractory osteomyelitis that we are noting at this point on his bone scan. 4. As he has been I still recommend that the patient continue with his current offloading he has been doing excellent with this I do not believe that is causing any detriment in the wound as he is doing a great job but I do believe that we need to continue to be very careful in this regard. We will see patient back for reevaluation in 1 week here in the clinic. If anything worsens or changes patient will contact our office for additional recommendations. Electronic Signature(s) Signed: 04/13/2019 3:04:14 PM By: Worthy Keeler [name] Previous Signature: 04/13/2019 11:29:42 AM Version By: Worthy Keeler [name] Previous Signature: 04/12/2019 10:07:26 AM Version By: Worthy Keeler [name] Entered By: Worthy Keeler on 04/13/2019 15:04:14 DAVEON, JAFARI ([id number]) -------------------------------------------------------------------------------- ROS/PFSH Details Patient Name: [name] Date of Service: 04/12/2019 8:00 AM Medical Record Number: [id number] Patient Account Number: [id number] Date of Birth/Sex: 03/15/1951 (68 y.o. M) Treating RN: Montey Hora Primary Care Provider: [name] Other Clinician: Referring Provider: [name] Treating Provider/Extender: [name], HOYT Weeks in Treatment: 29 Information Obtained From Patient Constitutional Symptoms (General  Health) Complaints and Symptoms: Negative for: Fatigue; Fever; Chills; Marked Weight Change Respiratory Complaints and Symptoms: Negative for: Chronic or frequent coughs; Shortness of Breath Medical History: Negative for: Aspiration; Asthma; Chronic Obstructive Pulmonary Disease (COPD); Pneumothorax; Sleep Apnea; Tuberculosis Cardiovascular Complaints and Symptoms: Negative for: Chest pain; LE edema Medical History: Positive for: Hypertension - takes medication Negative for:  Angina; Arrhythmia; Congestive Heart Failure; Coronary Artery Disease; Deep Vein Thrombosis; Hypotension; Myocardial Infarction; Peripheral Arterial Disease; Peripheral Venous Disease; Phlebitis; Vasculitis Psychiatric Complaints and Symptoms: Negative for: Anxiety; Claustrophobia Medical History: Negative for: Anorexia/bulimia; Confinement Anxiety Eyes Medical History: Positive for: Cataracts - both removed Negative for: Glaucoma; Optic Neuritis Ear/Nose/Mouth/Throat Medical History: Negative for: Chronic sinus problems/congestion; Middle ear problems Hematologic/Lymphatic Medical History: Negative for: Anemia; Hemophilia; Human Immunodeficiency Virus; Lymphedema DEKEN, DORFMAN. ([id number]) Gastrointestinal Medical History: Negative for: Cirrhosis ; Colitis; Crohnos; Hepatitis A; Hepatitis B; Hepatitis C Endocrine Medical History: Negative for: Type I Diabetes; Type II Diabetes Genitourinary Medical History: Negative for: End Stage Renal Disease Immunological Medical History: Negative for: Lupus Erythematosus; Raynaudos; Scleroderma Integumentary (Skin) Medical History: Negative for: History of Burn; History of pressure wounds Musculoskeletal Medical History: Negative for: Gout; Rheumatoid Arthritis; Osteoarthritis; Osteomyelitis Neurologic Medical History: Positive for: Paraplegia - waist down Negative for: Dementia; Neuropathy; Quadriplegia; Seizure Disorder Oncologic Medical  History: Negative for: Received Chemotherapy; Received Radiation Past Medical History Notes: Prostate cancer- currently treated with horomone therapy HBO Extended History Items Eyes: Cataracts Immunizations Pneumococcal Vaccine: Received Pneumococcal Vaccination: No Implantable Devices No devices added Family and Social History Cancer: No; Diabetes: No; Heart Disease: No; Hypertension: Yes - Father; Kidney Disease: No; Lung Disease: No; Seizures: No; Stroke: Yes - Mother; Thyroid Problems: No; Tuberculosis: No; Never smoker; Marital Status - Married; Alcohol Use: Never; Drug Use: No History; Caffeine Use: Daily AARUSH, SAHAGIAN ([id number]) Physician Affirmation I have reviewed and agree with the above information. Electronic Signature(s) Signed: 04/12/2019 4:20:48 PM By: Montey Hora Signed: 04/12/2019 6:05:24 PM By: Worthy Keeler [name] Entered By: Worthy Keeler on 04/12/2019 10:05:54 FINES, SUHRE ([id number]) -------------------------------------------------------------------------------- SuperBill Details Patient Name: [name] Date of Service: 04/12/2019 Medical Record Number: [id number] Patient Account Number: [id number] Date of Birth/Sex: 1951-06-01 (68 y.o. 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

## 2019-04-12 NOTE — Progress Notes (Signed)
RAYNARD, BIEGER ([id number]) Visit Report for 04/12/2019 HBO Risk Assessment Details Patient Name: [name], [name] Date of Service: 04/12/2019 8:00 AM Medical Record Number: [id number] Patient Account Number: [id number] Date of Birth/Sex: January 10, 1951 (68 y.o. M) Treating RN: Montey Hora Primary Care [name]: [name] Other Clinician: Referring Rilyn Scroggs: [name] Treating Algenis Ballin/Extender: [name], HOYT Weeks in Treatment: 71 HBO Risk Assessment Items Answer Any "Yes" answers must be brought to the hyperbaric physician's attention. Breathing or Lung problemso No Currently use tobacco productso No Used tobacco products in the pasto No Heart problemso No Do you take water pills (diuretic)o No Diabeteso No Dialysiso No Eye problems like glaucomao No Ear problems or surgeryo No Sinus Problemso No Cancero No Confinement Anxiety (Claustrophobia- fear of confined places)o No Any medical implants/devices that are fully or partially implanted or attached to your bodyo Yes Pregnanto No Seizureso No Date of last: Chest x-ray: 07/13/2018 EKG: 07/13/2018 CBC: 01/24/2019 Notes baclofen, spinal cord stimulator Electronic Signature(s) Signed: 04/12/2019 9:39:12 AM By: Montey Hora Entered By: Montey Hora on 04/12/2019 09:39:12

## 2019-04-12 NOTE — Progress Notes (Signed)
PAGE, RAMAGLIA ([id number]) Visit Report for 04/12/2019 Allergy List Details Patient Name: [name], [name] Date of Service: 04/12/2019 8:00 AM Medical Record Number: [id number] Patient Account Number: [id number] Date of Birth/Sex: [birth year] (68 y.o. M) Treating RN: [name] Primary Care [name]: [name] Other Clinician: Referring [name]: [name] Treating [name]: [name], [name] [name]: 59 Allergies Active Allergies penicillin Reaction: Rash doxycycline Reaction: rash clindamycin Allergy Notes Electronic Signature(s) Signed: 04/12/2019 4:20:48 PM By: [name] Entered By: [name] on 04/12/2019 08:56:37 [name], [name] ([id number]) -------------------------------------------------------------------------------- Arrival Information Details Patient Name: [name] Date of Service: 04/12/2019 8:00 AM Medical Record Number: [id number] Patient Account Number: [id number] Date of Birth/Sex: [birth year] (68 y.o. M) Treating RN: [name] Primary Care [name]: [name] Other Clinician: Referring [name]: [name] Treating [name]: [name], [name] [name]: 71 Visit Information Patient Arrived: Wheel Chair Arrival Time: 08:16 Accompanied By: wife Transfer Assistance: None Patient Requires Transmission-Based No Precautions: Patient Has Alerts: Yes Patient Alerts: NOT Diabetic History Since Last Visit Added or deleted any medications: No Any new allergies or adverse reactions: No Had a fall or experienced change in activities of daily living that may affect risk of falls: No Signs or symptoms of abuse/neglect since last visito No Hospitalized since last visit: No Implantable device outside of the clinic excluding cellular tissue based products placed in the center since last visit: No Has Dressing in Place as Prescribed: Yes Electronic  Signature(s) Signed: 04/12/2019 1:07:49 PM By: [name] [name] Entered By: [name] on 04/12/2019 08:18:23 [name], [name] ([id number]) -------------------------------------------------------------------------------- Clinic Level of Care Assessment Details Patient Name: [name] Date of Service: 04/12/2019 8:00 AM Medical Record Number: [id number] Patient Account Number: [id number] Date of Birth/Sex: 1951-07-04 (68 y.o. M) Treating RN: [name] Primary Care Aquan Kope: [name] Other Clinician: Referring [name]: [name] Treating Mckaylin Bastien/Extender: [name], [name] [name]: 43 Clinic Level of Care Assessment Items TOOL 4 Quantity Score []  - Use when only an EandM is performed on FOLLOW-UP visit 0 ASSESSMENTS - Nursing Assessment / Reassessment X - Reassessment of Co-morbidities (includes updates in patient status) 1 10 X- 1 5 Reassessment of Adherence to [name] Plan ASSESSMENTS - Wound and Skin Assessment / Reassessment X - Simple Wound Assessment / Reassessment - one wound 1 5 []  - 0 Complex Wound Assessment / Reassessment - multiple wounds []  - 0 Dermatologic / Skin Assessment (not related to wound area) ASSESSMENTS - Focused Assessment []  - Circumferential Edema Measurements - multi extremities 0 []  - 0 Nutritional Assessment / Counseling / Intervention []  - 0 Lower Extremity Assessment (monofilament, tuning fork, pulses) []  - 0 Peripheral Arterial Disease Assessment (using hand held doppler) ASSESSMENTS - Ostomy and/or Continence Assessment and Care []  - Incontinence Assessment and Management 0 []  - 0 Ostomy Care Assessment and Management (repouching, etc.) PROCESS - Coordination of Care X - Simple Patient / Family Education for ongoing care 1 15 []  - 0 Complex (extensive) Patient / Family Education for ongoing care X- 1 10 Staff obtains Programmer, systems, Records, Test Results /  Process Orders []  - 0 Staff telephones HHA, Nursing Homes / Clarify orders / etc []  - 0 Routine Transfer to another Facility (non-emergent condition) []  - 0 Routine Hospital Admission (non-emergent condition) []  - 0 New Admissions / Biomedical engineer / Ordering NPWT, Apligraf, etc. []  - 0 Emergency Hospital Admission (emergent condition) X- 1 10 Simple Discharge Coordination [name], BLOCH. ([id number]) []  -  0 Complex (extensive) Discharge Coordination PROCESS - Special Needs []  - Pediatric / Minor Patient Management 0 []  - 0 Isolation Patient Management []  - 0 Hearing / Language / Visual special needs []  - 0 Assessment of Community assistance (transportation, D/C planning, etc.) []  - 0 Additional assistance / Altered mentation []  - 0 Support Surface(s) Assessment (bed, cushion, seat, etc.) INTERVENTIONS - Wound Cleansing / Measurement X - Simple Wound Cleansing - one wound 1 5 []  - 0 Complex Wound Cleansing - multiple wounds X- 1 5 Wound Imaging (photographs - any number of wounds) []  - 0 Wound Tracing (instead of photographs) X- 1 5 Simple Wound Measurement - one wound []  - 0 Complex Wound Measurement - multiple wounds INTERVENTIONS - Wound Dressings X - Small Wound Dressing one or multiple wounds 1 10 []  - 0 Medium Wound Dressing one or multiple wounds []  - 0 Large Wound Dressing one or multiple wounds []  - 0 Application of Medications - topical []  - 0 Application of Medications - injection INTERVENTIONS - Miscellaneous []  - External ear exam 0 []  - 0 Specimen Collection (cultures, biopsies, blood, body fluids, etc.) []  - 0 Specimen(s) / Culture(s) sent or taken to Lab for analysis []  - 0 Patient Transfer (multiple staff / Civil Service fast streamer / Similar devices) []  - 0 Simple Staple / Suture removal (25 or less) []  - 0 Complex Staple / Suture removal (26 or more) []  - 0 Hypo / Hyperglycemic Management (close monitor of Blood Glucose) []  - 0 Ankle /  Brachial Index (ABI) - do not check if billed separately X- 1 5 Vital Signs [name], [name] Has the patient been seen at the hospital within the last three years: Yes Total Score: 85 Level Of Care: New/Established - Level 3 Electronic Signature(s) Signed: 04/12/2019 4:20:48 PM By: [name] Entered By: [name] on 04/12/2019 08:55:15 [name] ([id number]) -------------------------------------------------------------------------------- Encounter Discharge Information Details Patient Name: [name] Date of Service: 04/12/2019 8:00 AM Medical Record Number: [id number] Patient Account Number: [id number] Date of Birth/Sex: 06/28/1951 (68 y.o. M) Treating RN: [name] Primary Care Govind Furey: [name] Other Clinician: Referring Sheridyn Canino: [name] Treating Kristy Schomburg/Extender: [name], [name] [name]: 57 Encounter Discharge Information Items Discharge Condition: Stable Ambulatory Status: Wheelchair Discharge Destination: Home Transportation: Private Auto Accompanied By: wife Schedule Follow-up Appointment: Yes Clinical Summary of Care: Electronic Signature(s) Signed: 04/12/2019 4:20:48 PM By: [name] Entered By: [name] on 04/12/2019 09:02:40 [name] ([id number]) -------------------------------------------------------------------------------- Lower Extremity Assessment Details Patient Name: [name] Date of Service: 04/12/2019 8:00 AM Medical Record Number: [id number] Patient Account Number: [id number] Date of Birth/Sex: [birth year] (68 y.o. M) Treating RN: Harold Barban Primary Care Jolon Degante: [name] Other Clinician: Referring Melissaann Dizdarevic: [name] Treating Marzetta Lanza/Extender: [name], [name] [name]: 71 Notes Wound gluteal fold Electronic Signature(s) Signed: 04/12/2019 3:57:29 PM By: Harold Barban Entered By: Harold Barban on  04/12/2019 08:28:37 [name], [name] ([id number]) -------------------------------------------------------------------------------- Multi Wound Chart Details Patient Name: [name] Date of Service: 04/12/2019 8:00 AM Medical Record Number: [id number] Patient Account Number: [id number] Date of Birth/Sex: 04/03/1951 (68 y.o. M) Treating RN: [name] Primary Care Atanacio Melnyk: [name] Other Clinician: Referring Jasilyn Holderman: [name] Treating Erskin Zinda/Extender: [name], [name] [name]: 71 Vital Signs Height(in): 73 Pulse(bpm): 72 Weight(lbs): 210 Blood Pressure(mmHg): 132/82 Body Mass Index(BMI): 28 Temperature(F): 99.1 Respiratory Rate 16 (breaths/min): Photos: [N/A:N/A] Wound Location: Right Gluteal fold N/A N/A Wounding Event: Pressure Injury N/A N/A Primary Etiology: Pressure  Ulcer N/A N/A Comorbid History: Cataracts, Hypertension, N/A N/A Paraplegia Date Acquired: 11/02/2017 N/A N/A Brett of [name]: 19 N/A N/A Wound Status: Open N/A N/A Measurements L x W x D 4x2.4x2.8 N/A N/A (cm) Area (cm) : 7.54 N/A N/A Volume (cm) : 21.112 N/A N/A % Reduction in Area: 25.00% N/A N/A % Reduction in Volume: -2000.70% N/A N/A Position 1 (o'clock): 12 Maximum Distance 1 (cm): 4 Tunneling: Yes N/A N/A Classification: Category/Stage IV N/A N/A Exudate Amount: Large N/A N/A Exudate Type: Serosanguineous N/A N/A Exudate Color: red, brown N/A N/A Wound Margin: Epibole N/A N/A Granulation Amount: Medium (34-66%) N/A N/A Granulation Quality: Red, Pink, Hyper-granulation N/A N/A Necrotic Amount: Medium (34-66%) N/A N/A Exposed Structures: Fat Layer (Subcutaneous N/A N/A Tissue) Exposed: Yes Muscle: Yes Fascia: No Bauman, [name] Tendon: No Joint: No Bone: No Epithelialization: None N/A N/A [name] Notes Electronic Signature(s) Signed: 04/12/2019 4:20:48 PM By: [name] Entered By: [name] on 04/12/2019  08:34:53 [name] ([id number]) -------------------------------------------------------------------------------- [place] Details Patient Name: [name] Date of Service: 04/12/2019 8:00 AM Medical Record Number: [id number] Patient Account Number: [id number] Date of Birth/Sex: Dec 16, 1950 (68 y.o. M) Treating RN: [name] Primary Care Woodson Macha: [name] Other Clinician: Referring Rolla Servidio: [name] Treating Donovin Kraemer/Extender: [name], [name] [name]: 37 Active Inactive Orientation to the Wound Care Program Nursing Diagnoses: Knowledge deficit related to the wound healing center program Goals: Patient/caregiver will verbalize understanding of the [facility] Program Date Initiated: 11/30/2017 Target Resolution Date: 12/21/2017 Goal Status: Active Interventions: Provide education on orientation to the wound center Notes: Pressure Nursing Diagnoses: Knowledge deficit related to management of pressures ulcers Goals: Patient/caregiver will verbalize understanding of pressure ulcer management Date Initiated: 05/17/2018 Target Resolution Date: 05/28/2018 Goal Status: Active Interventions: Assess: immobility, friction, shearing, incontinence upon admission and as needed Notes: Wound/Skin Impairment Nursing Diagnoses: Impaired tissue integrity Goals: Patient/caregiver will verbalize understanding of skin care regimen Date Initiated: 11/30/2017 Target Resolution Date: 12/21/2017 Goal Status: Active Ulcer/skin breakdown will have a volume reduction of 30% by week 4 Date Initiated: 11/30/2017 Target Resolution Date: 12/21/2017 Goal Status: Active [name], [name] ([id number]) Interventions: Assess patient/caregiver ability to obtain necessary supplies Assess patient/caregiver ability to perform ulcer/skin care regimen upon admission and as needed Assess ulceration(s) every visit [name]  Activities: Skin care regimen initiated : 11/30/2017 Notes: Electronic Signature(s) Signed: 04/12/2019 4:20:48 PM By: [name] Entered By: [name] on 04/12/2019 08:34:46 [name], [name] ([id number]) -------------------------------------------------------------------------------- Pain Assessment Details Patient Name: [name] Date of Service: 04/12/2019 8:00 AM Medical Record Number: [id number] Patient Account Number: [id number] Date of Birth/Sex: 10-May-1951 (68 y.o. M) Treating RN: [name] Primary Care Acxel Dingee: [name] Other Clinician: Referring Shannin Naab: [name] Treating Konnor Jorden/Extender: [name], [name] [name]: 53 Active Problems Location of Pain Severity and Description of Pain Patient Has Paino Yes Site Locations Rate the pain. Current Pain Level: 4 Pain Management and Medication Current Pain Management: Electronic Signature(s) Signed: 04/12/2019 1:07:49 PM By: [name] [name] Signed: 04/12/2019 4:20:48 PM By: [name] Entered By: [name] on 04/12/2019 08:18:36 [name], [name] ([id number]) -------------------------------------------------------------------------------- Patient/Caregiver Education Details Patient Name: [name] Date of Service: 04/12/2019 8:00 AM Medical Record Number: [id number] Patient Account Number: [id number] Date of Birth/Gender: [birth year] (68 y.o. M) Treating RN: [name] Primary Care Physician: [name] Other Clinician: Referring Physician: Tracie [name] Treating Physician/Extender: Sharalyn Ink in [name]: 48 Education Assessment Education Provided To:  Patient and Caregiver Education Topics Provided Hyperbaric Oxygenation: Handouts: Other: need for HBOT Methods: Explain/Verbal Responses: State content correctly Electronic Signature(s) Signed: 04/12/2019 4:20:48 PM By: [name] Entered By: [name] on 04/12/2019 08:35:27 [name], [name] ([id number]) -------------------------------------------------------------------------------- Wound Assessment Details Patient Name: [name] Date of Service: 04/12/2019 8:00 AM Medical Record Number: [id number] Patient Account Number: [id number] Date of Birth/Sex: 01/09/1951 (68 y.o. M) Treating RN: Harold Barban Primary Care Angeliki Mates: [name] Other Clinician: Referring Jesica Goheen: [name] Treating Benoit Meech/Extender: [name], [name] [name]: 71 Wound Status Wound Number: 1 Primary Etiology: Pressure Ulcer Wound Location: Right Gluteal fold Wound Status: Open Wounding Event: Pressure Injury Comorbid History: Cataracts, Hypertension, Paraplegia Date Acquired: 11/02/2017 Brett Of [name]: 71 Clustered Wound: No Photos Wound Measurements Length: (cm) 4 Width: (cm) 2.4 Depth: (cm) 2.8 Area: (cm) 7.54 Volume: (cm) 21.112 % Reduction in Area: 25% % Reduction in Volume: -2000.7% Epithelialization: None Tunneling: Yes Position (o'clock): 12 Maximum Distance: (cm) 4 Undermining: No Wound Description Classification: Category/Stage IV Wound Margin: Epibole Exudate Amount: Large Exudate Type: Serosanguineous Exudate Color: red, brown Foul Odor After Cleansing: No Slough/Fibrino Yes Wound Bed Granulation Amount: Medium (34-66%) Exposed Structure Granulation Quality: Red, Pink, Hyper-granulation Fascia Exposed: No Necrotic Amount: Medium (34-66%) Fat Layer (Subcutaneous Tissue) Exposed: Yes Necrotic Quality: Adherent Slough Tendon Exposed: No Muscle Exposed: Yes Necrosis of Muscle: No Joint Exposed: No [place], [name] Bone Exposed: No [name] Notes Wound #1 (Right Gluteal fold) Notes zinc to peri wound, Hydrafera Blue, xtrasorb and tegaderm Electronic Signature(s) Signed: 04/12/2019 3:57:29 PM By: Harold Barban Entered By: Harold Barban on 04/12/2019 08:27:53 [name] ([id number]) -------------------------------------------------------------------------------- Vitals Details Patient Name: [name] Date of Service: 04/12/2019 8:00 AM Medical Record Number: [id number] Patient Account Number: [id number] Date of Birth/Sex: March 25, 1951 (68 y.o. M) Treating RN: [name] Primary Care Matraca Hunkins: [name] Other Clinician: Referring Kendrick Haapala: [name] Treating Nansi Birmingham/Extender: [name], [name] [name]: 43 Vital Signs Time Taken: 08:18 Temperature (F): 99.1 Height (in): 73 Pulse (bpm): 72 Weight (lbs): 210 Respiratory Rate (breaths/min): 16 Body Mass Index (BMI): 27.7 Blood Pressure (mmHg): 132/82 Reference Range: 80 - 120 mg / dl Electronic Signature(s) Signed: 04/12/2019 1:07:49 PM By: [name] [name] Entered By: [name] on 04/12/2019 08:19:41

## 2019-04-18 ENCOUNTER — Other Ambulatory Visit: Payer: Self-pay

## 2019-04-18 ENCOUNTER — Encounter: Payer: Medicare Other | Admitting: Physician Assistant

## 2019-04-18 DIAGNOSIS — L89314 Pressure ulcer of right buttock, stage 4: Secondary | ICD-10-CM | POA: Diagnosis not present

## 2019-04-18 NOTE — Progress Notes (Signed)
[name], [name] ([id number]) Visit Report for 04/18/2019 Arrival Information Details Patient Name: [name], [name] Date of Service: 04/18/2019 8:00 AM Medical Record Number: [id number] Patient Account Number: [id number] Date of Birth/Sex: [birth year] (68 y.o. M) Treating RN: Primary Care Richardine Peppers: [name] Other Clinician: Referring Winferd Wease: [name] Treating Chesni Vos/Extender: [name], HOYT Weeks in Treatment: 60 Visit Information History Since Last Visit Added or deleted any medications: No Patient Arrived: Wheel Chair Any new allergies or adverse reactions: No Arrival Time: 09:08 Had a fall or experienced change in No Accompanied By: wife activities of daily living that may affect Transfer Assistance: Manual risk of falls: Patient Identification Verified: Yes Signs or symptoms of abuse/neglect since last visito No Secondary Verification Process Completed: Yes Hospitalized since last visit: No Patient Requires Transmission-Based No Implantable device outside of the clinic excluding No Precautions: cellular tissue based products placed in the center Patient Has Alerts: Yes since last visit: Patient Alerts: NOT Has Dressing in Place as Prescribed: Yes Diabetic Pain Present Now: Yes Electronic Signature(s) Signed: 04/18/2019 11:39:37 AM By: Lorine Bears RCP, RRT, CHT Entered By: Lorine Bears on 04/18/2019 09:09:16 [name], [name] ([id number]) -------------------------------------------------------------------------------- Encounter Discharge Information Details Patient Name: [name] Date of Service: 04/18/2019 8:00 AM Medical Record Number: [id number] Patient Account Number: [id number] Date of Birth/Sex: 05/29/1951 (68 y.o. M) Treating RN: Primary Care Darik Massing: [name] Other Clinician: Referring Sarahmarie Leavey: [name] Treating Rudi Knippenberg/Extender: [name], HOYT Weeks in Treatment:  76 Encounter Discharge Information Items Discharge Condition: Stable Ambulatory Status: Wheelchair Discharge Destination: Home Transportation: Private Auto Accompanied By: wife Schedule Follow-up Appointment: No Clinical Summary of Care: Electronic Signature(s) Signed: 04/18/2019 11:39:37 AM By: Lorine Bears RCP, RRT, CHT Entered By: Lorine Bears on 04/18/2019 11:23:57 [name], [name] ([id number]) -------------------------------------------------------------------------------- Vitals Details Patient Name: [name] Date of Service: 04/18/2019 8:00 AM Medical Record Number: [id number] Patient Account Number: [id number] Date of Birth/Sex: 04-02-1951 (68 y.o. M) Treating RN: Primary Care Mariluz Crespo: [name] Other Clinician: Referring Chance Karam: [name] Treating Elpidia Karn/Extender: [name], HOYT Weeks in Treatment: 72 Vital Signs Time Taken: 08:10 Reference Range: 80 - 120 mg / dl Height (in): 73 Weight (lbs): 210 Body Mass Index (BMI): 27.7 Electronic Signature(s) Signed: 04/18/2019 11:39:37 AM By: Lorine Bears RCP, RRT, CHT Entered By: Lorine Bears on 04/18/2019 09:10:39

## 2019-04-18 NOTE — Progress Notes (Addendum)
RAINEY, IWANSKI ([id number]) Visit Report for 04/18/2019 HBO Details Patient Name: [name], [name] Date of Service: 04/18/2019 8:00 AM Medical Record Number: [id number] Patient Account Number: [id number] Date of Birth/Sex: 13-Feb-1951 (68 y.o. 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 HBO Treatment Details Treatment Number: 1 Patient Type: Outpatient Chamber Type: Monoplace Chamber Serial #: [id number] Treatment Protocol: 2.0 ATA with 90 minutes oxygen, and no air breaks Treatment Details Compression Rate Down: 1.0 psi / minute De-Compression Rate Up: 1.5 psi / minute Compress Tx Pressure Air breaks and breathing periods Decompress Decompress Begins Reached (leave unused spaces blank) Begins Ends Chamber Pressure (ATA) 1 2 - - - - - - 2 1 Clock Time (24 hr) 08:41 08:58 - - - - - - 10:29 10:38 Treatment Length: 117 (minutes) Treatment Segments: 4 Vital Signs Capillary Blood Glucose Reference Range: 80 - 120 mg / dl HBO Diabetic Blood Glucose Intervention Range: <131 mg/dl or >249 mg/dl Time Vitals Blood Respiratory Capillary Blood Glucose Pulse Action Type: Pulse: Temperature: Taken: Pressure: Rate: Glucose (mg/dl): Meter #: Oximetry (%) Taken: Pre 08:10 126/74 72 16 98.4 Post 10:42 126/74 66 16 98.2 Pre-Treatment Ear Evaluation Left Right Clear: Yes Clear: Yes Intact: Yes Intact: Yes Color: grey Color: grey PE Tubes inserted: No PE Tubes inserted: No Irrigated: No Irrigated: No Myringotomy performed: No Myringotomy performed: No Left Teed Scale: Grade 0 Right Teed Scale: Grade 0 Treatment  Response Treatment Toleration: Well Treatment Completed without Adverse Event MONG, [name] ([id number]) Treatment Completion Status: Post Treatment Teed Score Post Treatment Teed Score: Left Ear Grade 0 Post Treatment Teed Score: Right Ear Grade 0 HBO Attestation I certify that I supervised this HBO treatment in accordance with Medicare guidelines. A trained emergency response Yes team is readily available per hospital policies and procedures. Continue HBOT as ordered. Yes Electronic Signature(s) Signed: 04/19/2019 8:24:53 AM By: Worthy Keeler [name] Previous Signature: 04/18/2019 11:39:37 AM Version By: Lorine Bears RCP, RRT, CHT Previous Signature: 04/18/2019 11:55:14 AM Version By: Worthy Keeler [name] Entered By: Worthy Keeler on 04/19/2019 08:24:52 DREVIN, WILES ([id number]) -------------------------------------------------------------------------------- HBO Safety Checklist Details Patient Name: [name] Date of Service: 04/18/2019 8:00 AM Medical Record Number: [id number] Patient Account Number: [id number] Date of Birth/Sex: [birth year] (68 y.o. M) Treating RN: Primary Care Kawanda Drumheller: [name] Other Clinician: Referring Ival Basquez: [name] Treating Nyjah Schwake/Extender: [name], HOYT Weeks in Treatment: 72 HBO Safety Checklist Items Safety Checklist Consent Form Signed Patient voided / foley secured and emptied When did you last eato morning Last dose of injectable or oral agent n/a Ostomy pouch emptied and vented if applicable NA Medtronic Baclofen Pump; Nevro All implantable devices assessed, documented and approved Spinal Stimulator Intravenous access site secured and place NA Valuables secured Linens and cotton and cotton/polyester blend (less than 51% polyester) Personal oil-based products / skin lotions / body lotions removed Wigs or hairpieces removed NA Smoking or tobacco materials removed NA Books /  newspapers / magazines / loose paper removed Cologne, aftershave, perfume and deodorant removed Jewelry removed (may wrap wedding band) Make-up removed NA Hair care products removed Battery operated devices (external) removed Heating patches and chemical warmers removed Titanium eyewear removed NA Nail polish cured greater than  10 hours NA Casting material cured greater than 10 hours NA Hearing aids removed NA Loose dentures or partials removed NA Prosthetics have been removed NA Patient demonstrates correct use of air break device (if applicable) Patient concerns have been addressed Patient grounding bracelet on and cord attached to chamber Specifics for Inpatients (complete in addition to above) Medication sheet sent with patient Intravenous medications needed or due during therapy sent with patient Drainage tubes (e.g. nasogastric tube or chest tube secured and vented) Endotracheal or Tracheotomy tube secured Cuff deflated of air and inflated with saline Airway suctioned Electronic Signature(s) KRISTION, DEFORGE ([id number]) Signed: 04/18/2019 11:39:37 AM By: Lorine Bears RCP, RRT, CHT Entered By: Lorine Bears on 04/18/2019 09:14:19

## 2019-04-19 ENCOUNTER — Encounter: Payer: Medicare Other | Admitting: Physician Assistant

## 2019-04-19 DIAGNOSIS — L89314 Pressure ulcer of right buttock, stage 4: Secondary | ICD-10-CM | POA: Diagnosis not present

## 2019-04-19 NOTE — Progress Notes (Signed)
ENMANUEL, RAUM ([id number]) Visit Report for 04/19/2019 Problem List Details Patient Name: SONG, GENNUSA Date of Service: 04/19/2019 8:00 AM Medical Record Number: [id number] Patient Account Number: [id number] Date of Birth/Sex: [birth year] (68 y.o. 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, sequela I10 Essential (primary)  hypertension Facility Procedures CPT4 Code: [id number] Description: (Facility Use Only) HBOT, full body chamber, 96min Modifier: Quantity: 4 Physician Procedures CPT4 [id number] Description: [id number] - WC PHYS HYPERBARIC OXYGEN THERAPY ICD-10 Diagnosis Description M86.68 Other chronic osteomyelitis, other site L89.314 Pressure ulcer of right buttock, stage 4 S34.109S 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

## 2019-04-19 NOTE — Progress Notes (Signed)
DOIS, BILLING ([id number]) Visit Report for 04/19/2019 Arrival Information Details Patient Name: [name], [name] Date of Service: 04/19/2019 10:30 AM Medical Record Number: [id number] Patient Account Number: [id number] Date of Birth/Sex: Dec 02, 1950 (68 y.o. M) Treating RN: Harold Barban Primary Care Marvette Schamp: [name] Other Clinician: Referring Aydan Levitz: [name] Treating Mackie Holness/Extender: [name], HOYT Weeks in Treatment: 72 Visit Information History Since Last Visit Added or deleted any medications: No Patient Arrived: Wheel Chair Any new allergies or adverse reactions: No Arrival Time: 10:50 Had a fall or experienced change in No Accompanied By: wife activities of daily living that may affect Transfer Assistance: Manual risk of falls: Patient Identification Verified: Yes Signs or symptoms of abuse/neglect since last visito No Secondary Verification Process Completed: Yes Has Dressing in Place as Prescribed: Yes Patient Requires Transmission-Based No Pain Present Now: No Precautions: Patient Has Alerts: Yes Patient Alerts: NOT Diabetic Electronic Signature(s) Signed: 04/19/2019 3:56:44 PM By: Harold Barban Entered By: Harold Barban on 04/19/2019 10:50:50 [name] ([id number]) -------------------------------------------------------------------------------- Clinic Level of Care Assessment Details Patient Name: [name] Date of Service: 04/19/2019 10:30 AM Medical Record Number: [id number] Patient Account Number: [id number] Date of Birth/Sex: 1950-10-09 (68 y.o. M) Treating RN: Montey Hora Primary Care Anselmo Reihl: [name] Other Clinician: Referring Rivan Siordia: [name] Treating Charlesetta Milliron/Extender: [name], HOYT Weeks in Treatment: 72 Clinic Level of Care Assessment Items TOOL 4 Quantity Score []  - Use when only an EandM is performed on FOLLOW-UP visit 0 ASSESSMENTS - Nursing Assessment /  Reassessment X - Reassessment of Co-morbidities (includes updates in patient status) 1 10 X- 1 5 Reassessment of Adherence to Treatment Plan ASSESSMENTS - Wound and Skin Assessment / Reassessment X - Simple Wound Assessment / Reassessment - one wound 1 5 []  - 0 Complex Wound Assessment / Reassessment - multiple wounds []  - 0 Dermatologic / Skin Assessment (not related to wound area) ASSESSMENTS - Focused Assessment []  - Circumferential Edema Measurements - multi extremities 0 []  - 0 Nutritional Assessment / Counseling / Intervention []  - 0 Lower Extremity Assessment (monofilament, tuning fork, pulses) []  - 0 Peripheral Arterial Disease Assessment (using hand held doppler) ASSESSMENTS - Ostomy and/or Continence Assessment and Care []  - Incontinence Assessment and Management 0 []  - 0 Ostomy Care Assessment and Management (repouching, etc.) PROCESS - Coordination of Care X - Simple Patient / Family Education for ongoing care 1 15 []  - 0 Complex (extensive) Patient / Family Education for ongoing care X- 1 10 Staff obtains Programmer, systems, Records, Test Results / Process Orders []  - 0 Staff telephones HHA, Nursing Homes / Clarify orders / etc []  - 0 Routine Transfer to another Facility (non-emergent condition) []  - 0 Routine Hospital Admission (non-emergent condition) []  - 0 New Admissions / Biomedical engineer / Ordering NPWT, Apligraf, etc. []  - 0 Emergency Hospital Admission (emergent condition) X- 1 10 Simple Discharge Coordination MELVYN, DEREN. ([id number]) []  - 0 Complex (extensive) Discharge Coordination PROCESS - Special Needs []  - Pediatric / Minor Patient Management 0 []  - 0 Isolation Patient Management []  - 0 Hearing / Language / Visual special needs []  - 0 Assessment of Community assistance (transportation, D/C planning, etc.) []  - 0 Additional assistance / Altered mentation []  - 0 Support Surface(s) Assessment (bed, cushion, seat, etc.) INTERVENTIONS  - Wound Cleansing / Measurement X - Simple Wound Cleansing - one wound 1 5 []  - 0 Complex Wound Cleansing - multiple wounds X- 1 5 Wound Imaging (photographs - any number of wounds) []  - 0 Wound  Tracing (instead of photographs) X- 1 5 Simple Wound Measurement - one wound []  - 0 Complex Wound Measurement - multiple wounds INTERVENTIONS - Wound Dressings X - Small Wound Dressing one or multiple wounds 1 10 []  - 0 Medium Wound Dressing one or multiple wounds []  - 0 Large Wound Dressing one or multiple wounds []  - 0 Application of Medications - topical []  - 0 Application of Medications - injection INTERVENTIONS - Miscellaneous []  - External ear exam 0 []  - 0 Specimen Collection (cultures, biopsies, blood, body fluids, etc.) []  - 0 Specimen(s) / Culture(s) sent or taken to Lab for analysis X- 1 10 Patient Transfer (multiple staff / Civil Service fast streamer / Similar devices) []  - 0 Simple Staple / Suture removal (25 or less) []  - 0 Complex Staple / Suture removal (26 or more) []  - 0 Hypo / Hyperglycemic Management (close monitor of Blood Glucose) []  - 0 Ankle / Brachial Index (ABI) - do not check if billed separately X- 1 5 Vital Signs Buffa, Dreon J. ([id number]) Has the patient been seen at the hospital within the last three years: Yes Total Score: 95 Level Of Care: New/Established - Level 3 Electronic Signature(s) Signed: 04/19/2019 4:22:21 PM By: Montey Hora Entered By: Montey Hora on 04/19/2019 11:44:28 [name] ([id number]) -------------------------------------------------------------------------------- Encounter Discharge Information Details Patient Name: [name] Date of Service: 04/19/2019 10:30 AM Medical Record Number: [id number] Patient Account Number: [id number] Date of Birth/Sex: 06/08/1951 (68 y.o. M) Treating RN: Montey Hora Primary Care Dajuana Palen: [name] Other Clinician: Referring Waunetta Riggle: [name] Treating  Bland Rudzinski/Extender: [name], HOYT Weeks in Treatment: 28 Encounter Discharge Information Items Discharge Condition: Stable Ambulatory Status: Wheelchair Discharge Destination: Home Transportation: Private Auto Accompanied By: spouse Schedule Follow-up Appointment: Yes Clinical Summary of Care: Electronic Signature(s) Signed: 04/19/2019 11:45:32 AM By: Montey Hora Entered By: Montey Hora on 04/19/2019 11:45:31 [name] ([id number]) -------------------------------------------------------------------------------- Lower Extremity Assessment Details Patient Name: [name] Date of Service: 04/19/2019 10:30 AM Medical Record Number: [id number] Patient Account Number: [id number] Date of Birth/Sex: 1950/11/29 (68 y.o. M) Treating RN: Harold Barban Primary Care Kieryn Burtis: [name] Other Clinician: Referring Naim Murtha: [name] Treating Janathan Bribiesca/Extender: [name], HOYT Weeks in Treatment: 72 Electronic Signature(s) Signed: 04/19/2019 3:56:44 PM By: Harold Barban Entered By: Harold Barban on 04/19/2019 10:52:23 ERO, DEVENEY ([id number]) -------------------------------------------------------------------------------- Multi Wound Chart Details Patient Name: [name] Date of Service: 04/19/2019 10:30 AM Medical Record Number: [id number] Patient Account Number: [id number] Date of Birth/Sex: 29-Oct-1950 (68 y.o. M) Treating RN: Montey Hora Primary Care Eames Dibiasio: [name] Other Clinician: Referring Abb Gobert: [name] Treating Remmy Crass/Extender: [name], HOYT Weeks in Treatment: 72 Vital Signs Height(in): 73 Pulse(bpm): 72 Weight(lbs): 210 Blood Pressure(mmHg): 112/68 Body Mass Index(BMI): 28 Temperature(F): 98.1 Respiratory Rate 16 (breaths/min): Photos: [1:No Photos] [N/A:N/A] Wound Location: [1:Right Gluteal fold] [N/A:N/A] Wounding Event: [1:Pressure Injury] [N/A:N/A] Primary Etiology:  [1:Pressure Ulcer] [N/A:N/A] Comorbid History: [1:Cataracts, Hypertension, Paraplegia] [N/A:N/A] Date Acquired: [1:11/02/2017] [N/A:N/A] Weeks of Treatment: [1:72] [N/A:N/A] Wound Status: [1:Open] [N/A:N/A] Measurements L x W x D [1:4.5x1.5x2.4] [N/A:N/A] (cm) Area (cm) : [1:5.301] [N/A:N/A] Volume (cm) : [1:12.723] [N/A:N/A] % Reduction in Area: [1:47.30%] [N/A:N/A] % Reduction in Volume: [1:-1166.00%] [N/A:N/A] Position 1 (o'clock): [1:12] Maximum Distance 1 (cm): [1:4.3] Tunneling: [1:Yes] [N/A:N/A] Classification: [1:Category/Stage IV] [N/A:N/A] Exudate Amount: [1:Large] [N/A:N/A] Exudate Type: [1:Serosanguineous] [N/A:N/A] Exudate Color: [1:red, brown] [N/A:N/A] Wound Margin: [1:Epibole] [N/A:N/A] Granulation Amount: [1:Medium (34-66%)] [N/A:N/A] Granulation Quality: [1:Red, Pink, Hyper-granulation] [N/A:N/A] Necrotic Amount: [1:Medium (34-66%)] [N/A:N/A] Exposed Structures: [1:Fat Layer (Subcutaneous  Tissue) Exposed: Yes Muscle: Yes Fascia: No Tendon: No Joint: No Bone: No None] [N/A:N/A N/A] Treatment Notes KEIYON, LARE ([id number]) Electronic Signature(s) Signed: 04/19/2019 11:43:30 AM By: Montey Hora Entered By: Montey Hora on 04/19/2019 11:43:29 BRANON, PETROSINO ([id number]) -------------------------------------------------------------------------------- [place] Details Patient Name: [name] Date of Service: 04/19/2019 10:30 AM Medical Record Number: [id number] Patient Account Number: [id number] Date of Birth/Sex: 09-Oct-1950 (68 y.o. M) Treating RN: Montey Hora Primary Care Jareb Radoncic: [name] Other Clinician: Referring Mickayla Trouten: [name] Treating Radley Teston/Extender: [name], HOYT Weeks in Treatment: 80 Active Inactive Orientation to the Wound Care Program Nursing Diagnoses: Knowledge deficit related to the wound healing center program Goals: Patient/caregiver will verbalize understanding of the  [facility] Program Date Initiated: 11/30/2017 Target Resolution Date: 12/21/2017 Goal Status: Active Interventions: Provide education on orientation to the wound center Notes: Pressure Nursing Diagnoses: Knowledge deficit related to management of pressures ulcers Goals: Patient/caregiver will verbalize understanding of pressure ulcer management Date Initiated: 05/17/2018 Target Resolution Date: 05/28/2018 Goal Status: Active Interventions: Assess: immobility, friction, shearing, incontinence upon admission and as needed Notes: Wound/Skin Impairment Nursing Diagnoses: Impaired tissue integrity Goals: Patient/caregiver will verbalize understanding of skin care regimen Date Initiated: 11/30/2017 Target Resolution Date: 12/21/2017 Goal Status: Active Ulcer/skin breakdown will have a volume reduction of 30% by week 4 Date Initiated: 11/30/2017 Target Resolution Date: 12/21/2017 Goal Status: Active KEIZER, STIGEN ([id number]) Interventions: Assess patient/caregiver ability to obtain necessary supplies Assess patient/caregiver ability to perform ulcer/skin care regimen upon admission and as needed Assess ulceration(s) every visit Treatment Activities: Skin care regimen initiated : 11/30/2017 Notes: Electronic Signature(s) Signed: 04/19/2019 11:43:22 AM By: Montey Hora Entered By: Montey Hora on 04/19/2019 11:43:21 BERIC, PANAGAKOS ([id number]) -------------------------------------------------------------------------------- Pain Assessment Details Patient Name: [name] Date of Service: 04/19/2019 10:30 AM Medical Record Number: [id number] Patient Account Number: [id number] Date of Birth/Sex: Nov 27, 1950 (68 y.o. M) Treating RN: Harold Barban Primary Care Caden Fukushima: [name] Other Clinician: Referring Alric Geise: [name] Treating Lachrista Heslin/Extender: [name], HOYT Weeks in Treatment: 72 Active Problems Location of Pain Severity and  Description of Pain Patient Has Paino No Site Locations Pain Management and Medication Current Pain Management: Electronic Signature(s) Signed: 04/19/2019 3:56:44 PM By: Harold Barban Entered By: Harold Barban on 04/19/2019 10:50:57 [name] ([id number]) -------------------------------------------------------------------------------- Patient/Caregiver Education Details Patient Name: [name] Date of Service: 04/19/2019 10:30 AM Medical Record Number: [id number] Patient Account Number: [id number] Date of Birth/Gender: 05/24/1951 (68 y.o. M) Treating RN: Montey Hora Primary Care Physician: [name] Other Clinician: Referring Physician: [name] Treating Physician/Extender: Sharalyn Ink in Treatment: 49 Education Assessment Education Provided To: Patient and Caregiver Education Topics Provided Wound/Skin Impairment: Handouts: Other: wound care as ordered Methods: Demonstration, Explain/Verbal Responses: State content correctly Electronic Signature(s) Signed: 04/19/2019 4:22:21 PM By: Montey Hora Entered By: Montey Hora on 04/19/2019 11:44:57 [name] ([id number]) -------------------------------------------------------------------------------- Wound Assessment Details Patient Name: [name] Date of Service: 04/19/2019 10:30 AM Medical Record Number: [id number] Patient Account Number: [id number] Date of Birth/Sex: 07-23-1950 (68 y.o. M) Treating RN: Harold Barban Primary Care Jaiven Graveline: [name] Other Clinician: Referring Fate Galanti: [name] Treating Susie Pousson/Extender: [name], HOYT Weeks in Treatment: 72 Wound Status Wound Number: 1 Primary Etiology: Pressure Ulcer Wound Location: Right Gluteal fold Wound Status: Open Wounding Event: Pressure Injury Comorbid History: Cataracts, Hypertension, Paraplegia Date Acquired: 11/02/2017 Weeks Of Treatment: 72 Clustered Wound:  No Wound Measurements Length: (cm) 4.5 Width: (cm) 1.5 Depth: (cm) 2.4 Area: (cm) 5.301 Volume: (  cm) 12.723 % Reduction in Area: 47.3% % Reduction in Volume: -1166% Epithelialization: None Tunneling: Yes Position (o'clock): 12 Maximum Distance: (cm) 4.3 Undermining: No Wound Description Classification: Category/Stage IV Wound Margin: Epibole Exudate Amount: Large Exudate Type: Serosanguineous Exudate Color: red, brown Foul Odor After Cleansing: No Slough/Fibrino Yes Wound Bed Granulation Amount: Medium (34-66%) Exposed Structure Granulation Quality: Red, Pink, Hyper-granulation Fascia Exposed: No Necrotic Amount: Medium (34-66%) Fat Layer (Subcutaneous Tissue) Exposed: Yes Necrotic Quality: Adherent Slough Tendon Exposed: No Muscle Exposed: Yes Necrosis of Muscle: No Joint Exposed: No Bone Exposed: No Treatment Notes Wound #1 (Right Gluteal fold) Notes zinc to peri wound, Hydrafera Blue, xtrasorb and tegaderm Electronic Signature(s) Signed: 04/19/2019 11:06:42 AM By: Harold Barban Entered By: Harold Barban on 04/19/2019 11:06:41 JEMUEL, GLESSNER ([id number]) Tamera Punt, [name] -------------------------------------------------------------------------------- Vitals Details Patient Name: [name] Date of Service: 04/19/2019 10:30 AM Medical Record Number: [id number] Patient Account Number: [id number] Date of Birth/Sex: September 17, 1950 (68 y.o. M) Treating RN: Harold Barban Primary Care Cordarrel Stiefel: [name] Other Clinician: Referring Accalia Rigdon: [name] Treating Travonte Byard/Extender: [name], HOYT Weeks in Treatment: 72 Vital Signs Time Taken: 10:40 Temperature (F): 98.1 Height (in): 73 Pulse (bpm): 72 Weight (lbs): 210 Respiratory Rate (breaths/min): 16 Body Mass Index (BMI): 27.7 Blood Pressure (mmHg): 112/68 Reference Range: 80 - 120 mg / dl Electronic Signature(s) Signed: 04/19/2019 3:56:44 PM By: Harold Barban Entered By: Harold Barban on 04/19/2019 10:52:55

## 2019-04-19 NOTE — Progress Notes (Signed)
[name], [name] ([id number]) Visit Report for 04/18/2019 Problem List Details Patient Name: [name], [name] Date of Service: 04/18/2019 8:00 AM Medical Record Number: [id number] Patient Account Number: [id number] Date of Birth/Sex: 1950/12/25 (68 y.o. 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, sequela I10 Essential (primary) hypertension Facility Procedures CPT4 Code:  [id number] Description: (Facility Use Only) HBOT, full body chamber, 55min Modifier: Quantity: 4 Physician Procedures CPT4 Code: [id number] Description: [id number] - WC PHYS HYPERBARIC OXYGEN THERAPY ICD-10 Diagnosis Description M86.68 Other chronic osteomyelitis, other site L89.314 Pressure ulcer of right buttock, stage 4 G82.20 Paraplegia, unspecified S34.109S Unspecified injury to unspecified  level of lumbar spinal cord, Modifier: sequela Quantity: 1 Electronic Signature(s) Signed: 04/19/2019 8:25:01 AM By: Worthy Keeler [name] Previous Signature: 04/18/2019 11:39:37 AM Version By: Lorine Bears RCP, RRT, CHT Previous Signature: 04/18/2019 11:55:14 AM Version By: Worthy Keeler [name] Entered By: Worthy Keeler on 04/19/2019 08:25:00

## 2019-04-19 NOTE — Progress Notes (Addendum)
CAPRI, SADLIER ([id number]) Visit Report for 04/19/2019 Chief Complaint Document Details Patient Name: [name], [name] Date of Service: 04/19/2019 10:30 AM Medical Record Number: [id number] Patient Account Number: [id number] Date of Birth/Sex: 17-May-1951 (68 y.o. M) Treating RN: [name] Primary Care Provider: Tracie [name] Other Clinician: Referring Provider: Tracie [name] Treating Provider/Extender: [name], [name] in Treatment: 50 Information Obtained from: Patient Chief Complaint Right gluteal fold ulcer Electronic Signature(s) Signed: 04/19/2019 10:59:28 AM By: Brett Keeler [name] Entered By: [name] on 04/19/2019 10:59:28 [name], [name] ([id number]) -------------------------------------------------------------------------------- HPI Details Patient Name: [name] Date of Service: 04/19/2019 10:30 AM Medical Record Number: [id number] Patient Account Number: [id number] Date of Birth/Sex: 1951/02/22 (68 y.o. 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 He states that there is no history of diabetes that he is aware of. He does have issues with his prostate and is currently receiving treatment for this by way of oral medication. With that being said I do not have a lot of details in that regard. Nonetheless the patient presents today as a result of having been referred to [place] by another provider initially home health was set to come out and take care of his wound although due to the fact  that he apparently drives he's not able to receive home health. His wife is therefore trying to help take care of this wound within although they have been struggling with what exactly to do at this point. She states that she can do some things but she is definitely not a nurse and does have some issues with looking at blood. The good news is the wound does not appear to be too deep and is fairly superficial at this point. There is no slough noted there is some nonviable skin noted around the surface of the wound and the perimeter at this point. The central portion of the wound appears to be very good with a dermal layer noted this does not appear to be again deep enough to extend it to subcutaneous tissue at this point. 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 In the pump he has Dilaudid and baclofen. 12/07/17 on evaluation today patient presents for follow-up concerning his ongoing lower back thigh ulcer on the right. He states that he did not get the supplies ordered and therefore has not really been able to perform the dressing changes as directed exactly. His wife was able to get some Boarder Foam Dressing's from the drugstore and subsequently has been using hydrogel which did help to a degree in the wound does appear to be able smaller. There is actually more drainage this week noted than previous. 12/21/17 on evaluation today patient appears to be doing rather well in regard to his right gluteal ulcer. He has been tolerating the dressing changes without complication. There does not appear to be any evidence of infection at this point in time. Overall the wound does seem to be making some progress as far as the edges  are concerned there's not as much in the way of overlapping of the external wound edges and he has a good epithelium to wound bed border for the most part. This however is not  true right at the 12 o'clock location over the span of a little over a centimeters which actually will require debridement today to clean this away and hopefully allow it to continue to heal more appropriately. 12/28/17 on evaluation today patient appears to be doing rather well in regard to his ulcer in the left gluteal region. He's been tolerating the dressing changes without complication. Apparently he has had some difficulty getting his dressing material. Apparently there's been some confusion with ordering we're gonna check into this. Nonetheless overall he's been showing signs of improvement which is good news. Debridement is not required today. 01/04/18 on evaluation today patient presents for follow-up concerning his right gluteal ulcer. He has been tolerating the dressing changes fairly well. On inspection today it appears he may actually have some maceration them concerned about the fact that he may be developing too much moisture in and around the wound bed which can cause delay in healing. With that being said he unfortunately really has not showed significant signs of improvement since last week's evaluation in fact this may even be just the little bit/slightly larger. Nonetheless he's been having a lot of discomfort I'm not sure this is even related to the wound as he has no pain when I'm to breeding or otherwise cleaning the wound during evaluation today. Nonetheless this is something that we did recommend he talked to his pain specialist concerning. 01/11/18 on evaluation today patient appears to be doing better in regard to his ulceration. He has been tolerating the dressing changes without complication. With that being said overall there's no evidence of infection which is good news. The only thing is he did receive the hatch affair blue classic versus the ready nonetheless I feel like this is perfectly fine and appears to have done well for him over the past week. 01/25/18 on  evaluation today patient's wound actually appears to be a little bit larger than during the last evaluation. The good [name]. ([id number]) news is the majority of the wound edges actually appear to be fairly firmly attached to the wound bed unfortunately again we're not really making progress in regard to the size. Roughly the wound is about the same size as when I first saw him although again the wound margin/edges appear to be much better. 02/01/18 on evaluation today patient actually appears to be doing very well in regard to his wound. Applying the Prisma dry does seem to be better although he does still have issues with slow progression of the wound. There was a slight improvement compared to last week's measurements today. Nonetheless I have been considering other options as far as the possibility of Theraskin or even a snap vac. In general I'm not sure that the Theraskin due to location of the wound would be a very good idea. Nonetheless I do think that a snap vac could be a possibility for the patient and in fact I think this could even be an excellent way to manage the wound possibly seeing some improvement in a very rapid fashion here. Nonetheless this is something that we would need to get approved and I did have a lengthy conversation with the patient about this today. 02/08/18 on evaluation today patient appears to be doing a little better in regard to his ulcer.  He has been tolerating the dressing changes without complication. Fortunately despite the fact that the wound is a little bit smaller it's not significantly so unfortunately. We have discussed the possibility of a snap vac we did check with insurance this is actually covered at this point. Fortunately there does not appear to be any sign of infection. Overall I'm fairly pleased with how things seem to be appearing at this point. 02/15/18 on evaluation today patient appears to be doing rather well in regard to his right  gluteal ulcer. Unfortunately the snap vac did not stay in place with his sheer and friction this came loose and did not seem to maintain seal very well. He worked for about two days and it did seem to do very well during that time according to his wife but in general this does not seem to be something that's gonna be beneficial for him long-term. I do believe we need to go back to standard dressings to see if we can find something that will be of benefit. 03/02/18- He is here in follow up evaluation; there is minimal change in the wound. He will continue with the same treatment plan, would consider changing to iodosrob/iodoflex if ulcer continues to to plateau. He will follow up next week 03/08/18 on evaluation today patient's wound actually appears to be about the same size as when I previously saw him several [name] back. Unfortunately he does have some slightly dark discoloration in the central portion of the wound which has me concerned about pressure injury. I do believe he may be sitting for too long a period of time in fact he tells me that "I probably sit for much too long". He does have some Slough noted on the surface of the wound and again as far as the size of the wound is concerned I'm really not seeing anything that seems to have improved significantly. 03/15/18 on evaluation today patient appears to be doing fairly well in regard to his ulcer. The wound measured pretty much about the same today compared to last week's evaluation when looking at his graph. With that being said the area of bruising/deep tissue injury that was noted last week I do not see at this point. He did get a new cushion fortunately this does seem to be have been of benefit in my pinion. It does appear that he's been off of this more which is good news as well I think that is definitely showing in the overall wound measurements. With that being said I do believe that he needs to continue to offload I don't think that  the fact this is doing better should be or is going to allow him to not have to offload and explain this to him as well. Overall he seems to be in agreement the plan I think he understands. The overall appearance of the wound bed is improved compared to last week I think the Iodoflex has been beneficial in that regard. 03/29/18 on evaluation today patient actually appears to be doing rather well in regard to his wound from the overall appearance standpoint he does have some granulation although there's some Slough on the surface of the wound noted as well. With that being said he unfortunately has not improved in regard to the overall measurement of the wound in volume or in size. I did have a discussion with him very specifically about offloading today. He actually does work although he mainly is just sitting throughout the day. He tells me  he offloads by "lifting himself up for 30 seconds off of his chair occasionally" purchase from [employer] which does seem to have helped. And he has a new cushion that he with that being said he's also able to stand some for a very short period of time but not significant enough I think to provide appropriate offloading. I think the biggest issue at this point with the wound and the fact is not healing as quickly as we would like is due to the fact that he is really not able to appropriately offload while at work. He states the beginning after his injury he actually had a bed at his job that he could lay on in order to offload and that does seem to have been of help back at that time. Nonetheless he had not done this in quite some time unfortunately. I think that could be helpful for him this is something I would like for him to look into. 04/05/18 on evaluation today patient actually presents for follow-up concerning his right gluteal ulcer. Again he really is not significantly improved even compared to last week. He has been tolerating the dressing changes  without complication. With that being said fortunately there appears to be no evidence of infection at this time. He has been more proactive in trying to offload. NANA, HOSELTON ([id number]) 04/12/18 on evaluation today patient actually appears to be doing a little better in regard to his wound and the right gluteal fold region. He's been tolerating the dressing changes since removing the oasis without complication. However he was having a lot of burning initially with the oasis in place. He's unsure of exactly why this was given so much discomfort but he assumes that it was the oasis itself causing the problem. Nonetheless this had to be removed after about three days in place although even those three days seem to have made a fairly good improvement in regard to the overall appearance of the wound bed. In fact is the first time that he's made any improvement from the standpoint of measurements in about six [name]. He continues to have no discomfort over the area of the wound itself which leads me to wonder why he was having the burning with the oasis when he does not even feel the actual debridement's themselves. I am somewhat perplexed by this. 04/19/18 on evaluation today patient's wound actually appears to be showing signs of epithelialization around the edge of the wound and in general actually appears to be doing better which is good news. He did have the same burning after about three days with applying the Endoform last week in the same fashion that I would generally apply a skin substitute. This seems to indicate that it's not the oasis to cause the problem but potentially the moisture buildup that just causes things to burn or there may be some other reaction with the skin prep or Steri-Strips. Nonetheless I'm not sure that is gonna be able to tolerate any skin substitute for a long period of time. The good news is the wound actually appears to be doing better today compared to last  week and does seem to finally be making some progress. 04/26/18 on evaluation today patient actually appears to be doing rather well in regard to his ulcer in the right gluteal fold. He has been tolerating the dressing changes without complication which is good news. The Endoform does seem to be helping although he was a little bit more macerated this week. This  seems to be an ongoing issue with fluid control at this point. Nonetheless I think we may be able to add something like Drawtex to help control the drainage. 05/03/18 on evaluation today patient appears to actually be doing better in regard to the overall appearance of his wound. He has been tolerating the dressing changes without complication. Fortunately there appears to be no evidence of infection at this time. I really feel like his wound has shown signs as of today of turning around last week I thought so as well and definitely he could be seen in this week's overall appearance and measurements. In general I'm very pleased with the fact that he finally seems to be making a steady but sure progress. The patient likewise is very pleased. 05/17/18 on evaluation today patient appears to be doing more poorly unfortunately in regard to his ulcer. He has been tolerating the dressing changes without complication. With that being said he tells me that in the past couple of days he and his wife have noticed that we did not seem to be doing quite as well is getting dark near the center. Subsequently upon evaluation today the wound actually does appear to be doing worse compared to previous. He has been tolerating the dressing changes otherwise and he states that he is not been sitting up anymore than he was in the past from what he tells me. Still he has continued to work he states "I'm tired of dealing with this and if I have to just go home and lay in the bed all the time that's what I'll do". Nonetheless I am concerned about the fact that this  wound does appear to be deeper than what it was previous. 05/24/18 upon evaluation today patient actually presents after having been in the hospital due to what was presumed to be sepsis secondary to the wound infection. He had an elevated white blood cell count between 14 and 15. With that being said he does seem to be doing somewhat better now. His wound still is giving him some trouble nonetheless and he is obviously concerned about the fact likely talked about that this does seem to go more deeply than previously noted. I did review his wound culture which showed evidence of Staphylococcus aureus him and group B strep. Nonetheless he is on antibiotics, Levaquin, for this. Subsequently I did review his intake summary from the hospital as well. I also did look at the CT of the lumbar spine with contrast that was performed which showed no bone destruction to suggest lumbar disguises/osteomyelitis or sacral osteomyelitis. There was no paraspinal abscess. Nonetheless it appears this may have been more of just a soft tissue infection at this point which is good news. He still is nonetheless concerned about the wound which again I think is completely reasonable considering everything he's been through recently. 05/31/18 on evaluation today on evaluation today patient actually appears to be showing signs of his wound be a little bit deeper than what I would like to see. Fortunately he does not show any signs of significant infection although his temperature was 99 today he states he's been checking this at home and has not been elevated. Nonetheless with the undermining that I'm seeing at this point I am becoming more concerned about the wound I do think that offloading is a key factor here that is preventing the speedy recovery at this point. There does not appear to be any evidence of again over infection noted. He's been using  Santyl currently. 06/07/18 the patient presents today for follow-up  evaluation regarding the left ulcer in the gluteal region. He has been tolerating the Wound VAC fairly well. He is obviously very frustrated with this he states that to mean is really getting in his way. There does not appear to be any evidence of infection at this time he does have a little bit of odor I do not necessarily associate this with infection just something that we sometimes notice with Wound VAC therapy. With that being said I can definitely catch a tone of discontentment overall in the patient's demeanor today. This when he was previously in the hospital ORVELL, CAREAGA. ([id number]) an CT scan was done of the lumbar region which did not reveal any signs of osteomyelitis. With that being said the pelvis in particular was not evaluated distinctly which means he could still have some osteonecrosis I. Nonetheless the Wound VAC was started on Thursday I do want to get this little bit more time before jumping to a CT scan of the pelvis although that is something that I might would recommend if were not see an improvement by that time. 06/14/18 on evaluation today patient actually appears to be doing about the same in regard to his right gluteal ulcer. Again he did have a CT scan of the lumbar spine unfortunately this did not include the pelvis. Nonetheless with the depth of the wound that I'm seeing today even despite the fact that I'm not seeing any evidence of overt cellulitis I believe there's a good chance that we may be dealing with osteomyelitis somewhere in the right Ischial region. No fevers, chills, nausea, or vomiting noted at this time. 06/21/18 on evaluation today patient actually appears to be doing about the same with regard to his wound. The tunnel at 6 o'clock really does not appear to be any deeper although it is a little bit wider. I think at this point you may want to start packing this with white phone. Unfortunately I have not got approval for the CT scan of the pelvis  as of yet due to the fact that Medicare apparently has been denied it due to the diagnosis codes not being appropriate according to Medicare for the test requested. With that being said the patient cannot have an MRI and therefore this is the only option that we have as far as testing is concerned. The patient has had infection and was on antibiotics and been added code for cellulitis of the bottom to see if this will be appropriate for getting the test approved. Nonetheless I'm concerned about the infection have been spread deeper into the Ischial region. 06/28/18 on evaluation today patient actually appears to be doing rather well all things considered in regard to the right gluteal ulcer. He has been tolerating the dressing changes without complication. With that being said the Wound VAC he states does have to be replaced almost every day or at least reinforced unfortunately. Patient actually has his CT scan later this morning we should have the results by tomorrow. 07/05/18 on evaluation today patient presents for follow-up concerning his right Ischial ulcer. He did see the surgeon Dr. Lysle Pearl last week. They were actually very happy with him and felt like he spent a tremendous amount of time with them as far as discussing his situation was concerned. In the end Dr. Lysle Pearl did contact me as well and determine that he would not recommend any surgical intervention at this point as he felt like  it would not be in the patient's best interest based on what he was seeing. He recommended a referral to infectious disease. Subsequently this is something that Dr. Ines Bloomer office is working on setting up for the patient. As far as evaluation today is concerned the patient's wound actually appears to be worse at this point. I am concerned about how things are progressing and specifically about infection. I do not feel like it's the deeper but the area of depth is definitely widening which does have me concerned.  No fevers, chills, nausea, or vomiting noted at this time. I think that we do need initiate antibiotic therapy the patient has an allow allergy to amoxicillin/penicillin he states that he gets a rash since childhood. Nonetheless she's never had the issues with Catholics or cephalosporins in general but he is aware of. 07/27/18 on evaluation today patient presents following admission to the hospital on 07/09/18. He was subsequently discharged on 07/20/18. On 07/15/18 the patient underwent irrigation and debridement was soft tissue biopsy and bone biopsy as well as placement of a Wound VAC in the OR by Dr. Celine Ahr. During the hospital course the patient was placed on a Wound VAC and recommended follow up with surgery in three [name] actually with Dr. Delaine Lame who is infectious disease. The patient was on vancomycin during the hospital course. 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 He also had a bone culture which revealed evidence of methicillin-resistant staph aureus. He is updated CT scan 07/09/18 reveals that he had progression of the which was performed on wound to breakdown down to the trochanter where he actually had irregularities there as well suggestive of osteomyelitis. This was a change just since 9 December when we last performed a CT scan. Obviously this one had gone downhill quite significantly and rapidly. At this point upon evaluation I feel like in general the patient's wound seems to be doing fairly well all things considered upon my evaluation today. Obviously this is larger and deeper than what I previously evaluated but at the same time he seems to be making some progress as far as the appearance of the granulation tissue is concerned. I'm happy in that regard. No fevers, chills, nausea, or vomiting noted at this time. He is on IV vancomycin and Rocephin at the facility. He is currently in [employer]. 08/03/18 upon evaluation today  patient's wound appears to be doing better in regard to the overall appearance at this point in time. Fortunately he's been tolerating the Wound VAC without complication and states that the facility has been taking excellent care of the wound site. Overall I see some Slough noted on the surface which I am going to attempt sharp debridement today of but nonetheless other than this I feel like he's making progress. 08/09/18 on evaluation today patient's wound appears to be doing much better compared to even last week's evaluation. Do believe that the Wound VAC is been of great benefit for him. He has been tolerating the dressing changes that is the Wound KAYO, ZION. ([id number]) VAC without any complication and he has excellent granulation noted currently. There is no need for sharp debridement at this point. 08/16/18 on evaluation today patient actually appears to be doing very well in regard to the wound in the right gluteal fold region. This is showing signs of progress and again appears to be very healthy which is excellent news. Fortunately there is no sign of active infection by way of odor  or drainage at this point. Overall I'm very pleased with how things stand. He seems to be tolerating the Wound VAC without complication. 08/23/18 on evaluation today patient actually appears to be doing better in regard to his wound. He has been tolerating the Wound VAC without complication and in fact it has been collecting a significant amount of drainage which I think is good news especially considering how the wound appears. Fortunately there is no signs of infection at this time definitely nothing appears to be worse which is good news. He has not been started on the Bactrim and Flagyl that was recommended by Dr. Delaine Lame yet. I did actually contact her office this morning in order to check and see were things are that regard their gonna be calling me back. 08/30/18 on evaluation today patient  actually appears to show signs of excellent improvement today compared to last evaluation. The undermining is getting much better the wound seems to be feeling quite nicely and I'm very pleased that the granulation in general. With that being said overall I feel like the patient has made excellent progress which is great news. No fevers, chills, nausea, or vomiting noted at this time. 09/06/18 on evaluation today patient actually appears to be doing rather well in regard to his right gluteal ulcer. This is showing signs of improvement in overall I'm very pleased with how things seem to be progressing. The patient likewise is please. Overall I see no evidence of infection he is about to complete his oral antibiotic regimen which is the end of the antibiotics for him in just about three days. 09/13/18 on evaluation today patient's right Ischial ulcer appears to be showing signs of continued improvement which is excellent news. He's been tolerating the dressing changes without complication. Fortunately there's no signs of infection and the wound that seems to be doing very well. 09/28/18 on evaluation today patient appears to be doing rather well in regard to his right Ischial ulcer. He's been tolerating the Wound VAC without complication he knows there's much less drainage than there used to be this obviously is not a bad thing in my pinion. There's no evidence of infection despite the fact is but nothing about it now for several [name]. 10/04/18 on evaluation today patient appears to be doing better in regard to his right Ischial wound. He has been tolerating the Wound VAC without complication and I do believe that the silver nitrate last week was beneficial for him. Fortunately overall there's no evidence of active infection at this time which is great news. No fevers, chills, nausea, or vomiting noted at this time. 10/11/18 on evaluation today patient actually appears to be doing rather well in regard  to his Ischial ulcer. He's been tolerating the Wound VAC still without complication I feel like this is doing a good job. No fevers, chills, nausea, or vomiting noted at this time. 11/01/18 on evaluation today patient presents after having not been seen in our clinic for several [name] secondary to the fact that he was on evaluation today patient presents after having not been seen in our clinic for several [name] secondary to the fact that he was in a skilled nursing facility which was on lockdown currently due to the covert 19 national emergency. Subsequently he was discharged from the facility on this past Friday and subsequently made an appointment to come in to see yesterday. Fortunately there's no signs of active infection at this time which is good news and overall he does  seem to have made progress since I last saw. Overall I feel like things are progressing quite nicely. The patient is having no pain. 11/08/18 on evaluation today patient appears to be doing okay in regard to his right gluteal ulcer. He has been utilizing a Wound VAC home health this changing this at this point since he's home from the skilled nursing facility. Fortunately there's no signs of obvious active infection at this time. Unfortunately though there's no obvious active infection he is having some maceration and his wife states that when the sheets of the Wound VAC office on Sunday when it broke seal that he ended up having significant issues with some smell as well there concerned about the possibility of infection. Fortunately there's No fevers, chills, nausea, or vomiting noted at this time. 11/15/18 on evaluation today patient actually appears to be doing well in regard to his right gluteal ulcer. He has been tolerating the dressing changes without complication. Specifically the Wound VAC has been utilized up to this point. Fortunately there's no signs of infection and overall I feel like he has made progress even  since last week when I last saw him. I'm actually fairly happy with the overall appearance although he does seem to have somewhat of a hyper granular Dorion, Rodric J. ([id number]) overgrowth in the central portion of the wound which I think may require some sharp debridement to try flatness out possibly utilizing chemical cauterization following. 11/23/18 on evaluation today patient actually appears to be doing very well in regard to his sacral ulcer. He seems to be showing signs of improvement with good granulation. With that being said he still has the small area of hyper granulation right in the central portion of the wound which I'm gonna likely utilize silver nitrate on today. Subsequently he also keeps having a leak at the 6 o'clock location which is unfortunate we may be able to help out with some suggestions to try to prevent this going forward. Fortunately there's no signs of active infection at this time. 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 He's been tolerating the dressing changes without complication. Fortunately there's no signs of active infection at this time. I've been rather pleased with how things have progressed there still some evidence of pressure getting to the area with some redness right around the immediate wound opening. Nonetheless other than this I'm not seeing any significant complications or issues the wound is somewhat hyper granular. Upon discussing with the patient and his wife today I'm not sure that the wound is being packed to the base with the foam at this point. And if it's not been packed fully that may be part of the reason why is not seen as much improvement as far as the granulation from the base out. Again we do not want pack too tightly but we need some of the firm to get to the base of the wound. I discussed this with patient and his wife  today. 12/06/18 on evaluation today patient appears to be doing well in regard to his right gluteal pressure ulcer. He's been tolerating the dressing changes without complication. Fortunately there's no signs of active infection. He still has some hyper granular tissue and I do think it would be appropriate to continue with the chemical cauterization as of today. 12/16/18 on evaluation today patient actually appears to be doing okay in regard to his right  gluteal ulcer. He is been tolerating the dressing changes without complication including the Wound VAC. Overall I feel like nothing seems to be worsening I do feel like that the hyper granulation buds in the central portion of the wound have improved to some degree with the silver nitrate. We will have to see how things continue to progress. 12/20/18 on evaluation today patient actually appears to be doing much worse in my pinion even compared to last week's evaluation. Unfortunately as opposed to showing any signs of improvement the areas of hyper granular tissue in the central portion of the wound seem to be getting worse. Subsequently the wound bed itself also seems to be getting deeper even compared to last week which is both unusual as well as concerning since prior he had been shown signs of improvement. Nonetheless I think that the issue could be that he's actually having some difficulty in issues with a deeper infection. There's no external signs of infection but nonetheless I am more worried about the internal, osteomyelitis, that could be restarting. He has not been on antibiotics for some time at this point. I think that it may be a good idea to go ahead and started back on an antibiotic therapy while we wait to see what the testing shows. 12/27/18 on evaluation today patient presents for follow-up concerning his left gluteal fold wound. Fortunately he appears to be doing well today. I did review the CT scan which was negative for any signs of  osteomyelitis or acute abnormality this is excellent news. Overall I feel like the surface of the wound bed appears to be doing significantly better today compared to previously noted findings. There does not appear any signs of infection nor does he have any pain at this time. 01/03/19 on evaluation today patient actually appears to be doing quite well in regard to his ulcer. Post debridement last week he really did not have too much bleeding which is good news. Fortunately today this seems to be doing some better but we still has some of the hyper granular tissue noted in the base of the wound which is gonna require sharp debridement today as well. Overall I'm pleased with how things seem to be progressing since we switched away from the Wound VAC I think he is making some progress. 01/10/19 on evaluation today patient appears to be doing better in regard to his right gluteal fold ulcer. He has been tolerating the dressing changes without complication. The debridement to seem to be helping with current away some of the poor hyper granular tissue bugs throughout the region of his gluteal fold wound. He's been tolerating the dressing changes otherwise without complication which is great news. No fevers, chills, nausea, or vomiting noted at this time. 01/17/19 on evaluation today patient actually appears to be doing excellent in regard to his wound. He's been tolerating the dressing changes without complication. Fortunately there is no signs of active infection at this time which is great news. No fevers, chills, nausea, or vomiting noted at this time. 01/24/19 on evaluation today patient actually appears to be doing quite well with regard to his ulcer. He has been tolerating the dressing changes without complication. Fortunately there's no signs of active infection at this time. Overall been very pleased with the progress that he seems to be making currently. 01/31/19-Patient returns at 1 week with  apparent similarity in dimensions to the wound, with no signs of infection, he has been [place] ([id number]) changing dressings twice  a day 02/08/19 upon evaluation today patient actually appears to be doing well with regard to his right Ischial ulcer. The wound is not appear to be quite as deep and seems to be making progress which is good news. With that being said I'm still reluctant to go back to the Wound VAC at this point. He's been having to change the dressings twice a day which is a little bit much in my pinion from the wound care supplies standpoint. I think that possibly attempting to utilize extras orbit may be beneficial this may also help to prevent any additional breakdown secondary to fluid retention in the wound itself. The patient is in agreement with giving this a try. 02/15/19 on evaluation today patient actually appears to be doing decently well with regard to his ulcer in the right to gluteal fold location. He's been tolerating the dressing changes without complication. Fortunately there is no signs of active infection at this time. He is able to keep the current dressing in place more effectively for a day at a time whereas before he was having a changes to to three times a day. The actions or has been helpful in this regard. Fortunately there's no signs of anything getting worse and I do feel like he showing signs of good improvement with regard to the wound bed status. 02/22/2019 patient appears to be doing very well today with regard to his ulcer in the gluteal fold. Fortunately there is no signs of active infection and he has been tolerating the dressing changes without any complication. Overall extremely pleased with how things seem to be progressing. He has much less of the hyper granular projections within the wound these have slowly been debrided away and he seems to be doing well. The wound bed is more uniform. 03/01/19 on evaluation today patient appears to  be doing unfortunately about the same in regard to his gluteal ulcer. He's been tolerating the dressing changes without complication. Fortunately there's no signs of active infection at this time. With that being said he continues to develop these hyper granular projections which I'm unsure of exactly what they are and why they are rising. Nonetheless I explained to the patient that I do believe it would be a good idea for [place] to stand a biopsy sample for pathology to see if that can shed any light on what exactly may be going on here. Fortunately I do not see any obvious signs of infection. With that being said the patient has had a little bit more drainage this week apparently compared to last week. 03/08/2019 on evaluation today patient actually appears to look somewhat better with regard to the appearance of his wound bed at this time. This is good news. Overall I am very pleased with how things seem to have progressed just in the past week with a switch to the [facility] dressing. I think that has been beneficial for him. With that being said at this time the patient is concerned about his biopsy that I sent off last week unfortunately I do not have that report as of yet. Nonetheless we have called to obtain this and hopefully will hear back from the lab later this morning. 03/15/19 on evaluation today patient's wound actually appears to be doing okay today with regard to the overall appearance of the wound bed. He has been tolerating the dressing changes without complication he still has hyper granular tissue noted but fortunately that seems to be minimal at this point compared  to some of what we've seen in the past. Nonetheless I do think that he is still having some issues currently with some of this type of granulation the biopsy and since all showed nothing more than just evidence of granulation tissue. Therefore there really is nothing different to initiate or do at this point. 03/24/19 on  evaluation today patient appears to be doing a little better with regard to his ulcer. He's been tolerating the dressing changes without complication. Fortunately there is no signs of active infection at this time. No fevers, chills, nausea, or vomiting noted at this time. I'm overall pleased with how things seem to be progressing. 03/29/2019 on evaluation today patient appears to be doing about the same in regard to his ulcer in the right gluteal fold. Unfortunately he is not seeming to make a lot of progress and the wound is somewhat stalled. There is no signs of active infection externally but I am concerned about the possibility of infection continuing in the ischial location which previously he did have infection noted. Again is not able to have an MRI so probably our best option for testing for this would be a triple phase bone scan which will detect subtle changes in the bone more so than plain film x-rays. In the past they really have not been beneficial and in fact the CT scans even have been somewhat questionable at times. Nonetheless there is no signs of systemic infection which is at least good news but again his wound is not healing at all on the predicted schedule. 04/05/19 on evaluation today patient appears to be doing well all things considering with regard to his wound and the right gluteal fold. He actually has his triple bone scan scheduled for sometime in the next couple of days. With that being said I've also been looking to other possibilities of what could be causing hyper granular tissue were looking into the bone scan again for evaluating for the risk or possibility of infection deeper and I'm also gonna go ahead and see about obtaining a deep tissue culture today to send and see if there's any evidence of infection noted on culture. He's in agreement with that plan. 04/12/2019 on evaluation today patient presents for reevaluation here in our clinic concerning ongoing issues with  his right gluteal fold ulcer. 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 It was discussed with him at that point that I think it would be appropriate MATEJ, SPILLER. ([id number]) for him to proceed with hyperbaric oxygen therapy especially in light of the fact that he is previously been on IV antibiotics at the beginning of the year for close to 3 months followed by several [name] of oral antibiotics that was all prescribed by infectious disease. He had surgical debridement around Christmas December 2019 due to an abscess and osteomyelitis of the ischial bone. Unfortunately this has not really proceeded as well as we would have liked and again we did a CT scan even a couple months ago as he cannot have an MRI secondary to having issues with both a pain pump as well as a spinal cord stimulator which prevent him from going into an MRI machine. With that being said there were chronic changes noted at the ischial tuberosity which had progressed since December 2019 there was no evidence of fluid collection on that initial CT scan. With that being said  that was on January 21, 2019. I am not sure that it was a sensitive enough test as compared to an MRI and then subsequently I ordered a bone scan for him which was actually completed on 03/29/2019 and this revealed that he does indeed have positive osteomyelitis involving the right ischial tuberosity. This is adjacent to the ulcer and I think is the reason that his ulcer is not healing. Subsequently I am in a place him back on oral antibiotics today unfortunately his wound culture showed just mixed gram-negative flora with no specific findings of a predominant organism. Nonetheless being with the location I think a good broad-spectrum antibiotic for gram-negative's is a good choice at this point he is previously taken Levaquin without  significant issues and I think that is an appropriate antibiotic for him at this time. 04/19/2019 on evaluation today patient actually appears to be doing fairly well with regard to his wound. He has been tolerating the dressing changes without complication. Fortunately there is no signs of active infection and he has been taking the oral antibiotics at this time. Subsequently we did make a referral to infectious disease although Dr. Steva Ready wants the patient to be seen by general surgery first in order apparently to see if there is anything from a surgical standpoint that should be done prior to initiation of IV antibiotic therapy. Again the patient is okay with actually seeing Dr. Celine Ahr whom he has seen before we will make that referral. Electronic Signature(s) Signed: 04/19/2019 1:04:08 PM By: Brett Keeler [name] Entered By: [name] on 04/19/2019 13:04:08 CARVER, [place] ([id number]) -------------------------------------------------------------------------------- Physical Exam Details Patient Name: [name] Date of Service: 04/19/2019 10:30 AM Medical Record Number: [id number] Patient Account Number: [id number] Date of Birth/Sex: 1951/01/30 (68 y.o. M) Treating RN: [name] Primary Care Provider: Tracie [name] Other Clinician: Referring Provider: Tracie [name] Treating Provider/Extender: [name], [name] in Treatment: 59 Constitutional Well-nourished and well-hydrated in no acute distress. Respiratory normal breathing without difficulty. clear to auscultation bilaterally. Cardiovascular regular rate and rhythm with normal S1, S2. Psychiatric this patient is able to make decisions and demonstrates good insight into disease process. Alert and Oriented x 3. pleasant and cooperative. Notes Patient's wound bed currently still shows some hyper granulation although there does not appear to be any progression and in fact overall the wound surface  seems to be doing much better which is good news. My hope is that with the hyperbarics coupled with antibiotics that the infection will improve. We will see if Dr. Celine Ahr feels like she needs to do anything from a surgical standpoint further at this point Electronic Signature(s) Signed: 04/19/2019 1:04:35 PM By: Brett Keeler [name] Entered By: [name] on 04/19/2019 13:04:35 ELDA, SMUCKER ([id number]) -------------------------------------------------------------------------------- Physician Orders Details Patient Name: [name] Date of Service: 04/19/2019 10:30 AM Medical Record Number: [id number] Patient Account Number: [id number] Date of Birth/Sex: [birth year] (68 y.o. 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 Skin Barriers/Peri-Wound Care Wound #1 Right Gluteal fold o Other: - Apply zinc oxide to peri-wound area. Primary Wound Dressing Wound #1 Right Gluteal fold o Hydrafera Blue Ready Transfer Secondary Dressing Wound #1 Right Gluteal fold o XtraSorb - Secure with Tegaderm Dressing Change Frequency Wound #1 Right Gluteal fold o Change dressing every day. Follow-up Appointments Wound #1 Right Gluteal  fold o Return Appointment in 1 week. Off-Loading Wound #1 Right Gluteal fold o Turn and reposition every 2 hours JERIEL, HARDWELL ([id number]) Hyperbaric Oxygen Therapy Wound #1 Right Gluteal fold o Evaluate for HBO Therapy o Indication: - Chronic osteomyelitis of the right ischial tuberosity o If appropriate for treatment, begin HBOT per protocol: o 2.0 ATA for 90 Minutes without Air Breaks o One treatment per day (delivered Monday through Friday unless otherwise specified in Special Instructions below): o Total # of Treatments: - 40 Consults o General Surgery Electronic Signature(s) Signed: 04/19/2019 4:22:21 PM By: [name] Signed: 04/20/2019 9:08:00 AM By: Brett Keeler [name] Entered By: [name] on 04/19/2019 11:43:58 ARIEN, HODGKIN ([id number]) -------------------------------------------------------------------------------- Problem List Details Patient Name: [name] Date of Service: 04/19/2019 10:30 AM Medical Record Number: [id number] Patient Account Number: [id number] Date of Birth/Sex: [birth year] (67 y.o. 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 He states that there is no history of diabetes that he is aware of. He does have issues with his prostate and is currently receiving treatment for this by way of oral medication. With that being said I do not have a lot of details in that regard. Nonetheless the patient presents today as a result of having been referred to [place] by another provider initially home health was set to come out and take care of his wound although due to the fact that he apparently drives he's not able to receive home health. His wife is therefore trying to help take care of this wound within although they have been struggling with what exactly to do at this point. She states that she can do some things but she is definitely not a nurse and does  have some issues with looking at blood. The good news is the wound does not appear to be too deep and is fairly superficial at this point. There is no slough noted there is some nonviable skin noted around the surface of the wound and the perimeter at this point. The central portion of the wound  appears to be very good with a dermal layer noted this does not appear to be again deep enough to extend it to subcutaneous tissue at this point. 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 In the pump he has Dilaudid and baclofen. 12/07/17 on evaluation today patient presents for follow-up concerning his ongoing lower back thigh ulcer on the right. He states that he did not get the supplies ordered and therefore has not really been able to perform the dressing changes as directed exactly. His wife was able to get some Boarder Foam Dressing's from the drugstore and subsequently has been using hydrogel which did help to a degree in the wound does appear to be able smaller. There is actually more drainage this week noted than previous. 12/21/17 on evaluation today patient appears to be doing rather well in regard to his right gluteal ulcer. He has been tolerating the dressing changes without complication. There does not appear to be any evidence of infection at this point in time. Overall the wound does seem to be making some progress as far as the edges are concerned there's not as much in the way of overlapping of the external wound edges and he has a good epithelium to wound bed border for the most part. This however is not true right at the 12 o'clock location over the span of a little over a centimeters which actually will require debridement today to clean this away and hopefully allow it to continue to heal more appropriately. 12/28/17 on evaluation today patient appears to be doing rather well in regard to his ulcer in the left gluteal region. He's been tolerating the dressing changes without complication. Apparently he has had some difficulty getting his dressing material. Apparently there's been some confusion with ordering we're gonna check into this. Nonetheless overall he's been  showing signs of improvement which is good news. Debridement is not required today. 01/04/18 on evaluation today patient presents for follow-up concerning his right gluteal ulcer. He has been tolerating the dressing changes fairly well. On inspection today it appears he may actually have some maceration them concerned about the fact that he may be developing too much moisture in and around the wound bed which can cause delay in healing. With that being said he unfortunately really has not showed significant signs of improvement since last week's evaluation in fact this may even be just the little bit/slightly larger. Nonetheless he's been having a lot of discomfort I'm not sure this is even related to the wound as he has no pain when I'm to breeding or otherwise cleaning the wound during evaluation today. Nonetheless this is something that we did recommend he talked to his pain specialist concerning. 01/11/18 on evaluation today patient appears to be doing better in regard to his ulceration. He has been tolerating the dressing Neal, Osiris J. ([id number]) changes without complication. With that being said overall there's no evidence of infection which is good news. The only thing is he did receive the hatch affair blue classic versus the ready nonetheless I feel like  this is perfectly fine and appears to have done well for him over the past week. 01/25/18 on evaluation today patient's wound actually appears to be a little bit larger than during the last evaluation. The good news is the majority of the wound edges actually appear to be fairly firmly attached to the wound bed unfortunately again we're not really making progress in regard to the size. Roughly the wound is about the same size as when I first saw him although again the wound margin/edges appear to be much better. 02/01/18 on evaluation today patient actually appears to be doing very well in regard to his wound. Applying the Prisma  dry does seem to be better although he does still have issues with slow progression of the wound. There was a slight improvement compared to last week's measurements today. Nonetheless I have been considering other options as far as the possibility of Theraskin or even a snap vac. In general I'm not sure that the Theraskin due to location of the wound would be a very good idea. Nonetheless I do think that a snap vac could be a possibility for the patient and in fact I think this could even be an excellent way to manage the wound possibly seeing some improvement in a very rapid fashion here. Nonetheless this is something that we would need to get approved and I did have a lengthy conversation with the patient about this today. 02/08/18 on evaluation today patient appears to be doing a little better in regard to his ulcer. He has been tolerating the dressing changes without complication. Fortunately despite the fact that the wound is a little bit smaller it's not significantly so unfortunately. We have discussed the possibility of a snap vac we did check with insurance this is actually covered at this point. Fortunately there does not appear to be any sign of infection. Overall I'm fairly pleased with how things seem to be appearing at this point. 02/15/18 on evaluation today patient appears to be doing rather well in regard to his right gluteal ulcer. Unfortunately the snap vac did not stay in place with his sheer and friction this came loose and did not seem to maintain seal very well. He worked for about two days and it did seem to do very well during that time according to his wife but in general this does not seem to be something that's gonna be beneficial for him long-term. I do believe we need to go back to standard dressings to see if we can find something that will be of benefit. 03/02/18- He is here in follow up evaluation; there is minimal change in the wound. He will continue with the same  treatment plan, would consider changing to iodosrob/iodoflex if ulcer continues to to plateau. He will follow up next week 03/08/18 on evaluation today patient's wound actually appears to be about the same size as when I previously saw him several [name] back. Unfortunately he does have some slightly dark discoloration in the central portion of the wound which has me concerned about pressure injury. I do believe he may be sitting for too long a period of time in fact he tells me that "I probably sit for much too long". He does have some Slough noted on the surface of the wound and again as far as the size of the wound is concerned I'm really not seeing anything that seems to have improved significantly. 03/15/18 on evaluation today patient appears to be doing fairly well in  regard to his ulcer. The wound measured pretty much about the same today compared to last week's evaluation when looking at his graph. With that being said the area of bruising/deep tissue injury that was noted last week I do not see at this point. He did get a new cushion fortunately this does seem to be have been of benefit in my pinion. It does appear that he's been off of this more which is good news as well I think that is definitely showing in the overall wound measurements. With that being said I do believe that he needs to continue to offload I don't think that the fact this is doing better should be or is going to allow him to not have to offload and explain this to him as well. Overall he seems to be in agreement the plan I think he understands. The overall appearance of the wound bed is improved compared to last week I think the Iodoflex has been beneficial in that regard. 03/29/18 on evaluation today patient actually appears to be doing rather well in regard to his wound from the overall appearance standpoint he does have some granulation although there's some Slough on the surface of the wound noted as well. With  that being said he unfortunately has not improved in regard to the overall measurement of the wound in volume or in size. I did have a discussion with him very specifically about offloading today. He actually does work although he mainly is just sitting throughout the day. He tells me he offloads by "lifting himself up for 30 seconds off of his chair occasionally" purchase from [employer] which does seem to have helped. And he has a new cushion that he with that being said he's also able to stand some for a very short period of time but not significant enough I think to provide appropriate offloading. I think the biggest issue at this point with the wound and the fact is not healing as quickly as we would like is due to the fact that he is really not able to appropriately offload while at work. He states the beginning after his injury he actually had a bed at his job that he could lay on in order to offload and that does seem to have been of help back at that time. Nonetheless he had not done this in quite some time unfortunately. I think that could be helpful for him this is something I would like for him to look into. JEANETTE, DIFRANCO ([id number]) 04/05/18 on evaluation today patient actually presents for follow-up concerning his right gluteal ulcer. Again he really is not significantly improved even compared to last week. He has been tolerating the dressing changes without complication. With that being said fortunately there appears to be no evidence of infection at this time. He has been more proactive in trying to offload. 04/12/18 on evaluation today patient actually appears to be doing a little better in regard to his wound and the right gluteal fold region. He's been tolerating the dressing changes since removing the oasis without complication. However he was having a lot of burning initially with the oasis in place. He's unsure of exactly why this was given so much discomfort but  he assumes that it was the oasis itself causing the problem. Nonetheless this had to be removed after about three days in place although even those three days seem to have made a fairly good improvement in regard to the overall appearance of the  wound bed. In fact is the first time that he's made any improvement from the standpoint of measurements in about six [name]. He continues to have no discomfort over the area of the wound itself which leads me to wonder why he was having the burning with the oasis when he does not even feel the actual debridement's themselves. I am somewhat perplexed by this. 04/19/18 on evaluation today patient's wound actually appears to be showing signs of epithelialization around the edge of the wound and in general actually appears to be doing better which is good news. He did have the same burning after about three days with applying the Endoform last week in the same fashion that I would generally apply a skin substitute. This seems to indicate that it's not the oasis to cause the problem but potentially the moisture buildup that just causes things to burn or there may be some other reaction with the skin prep or Steri-Strips. Nonetheless I'm not sure that is gonna be able to tolerate any skin substitute for a long period of time. The good news is the wound actually appears to be doing better today compared to last week and does seem to finally be making some progress. 04/26/18 on evaluation today patient actually appears to be doing rather well in regard to his ulcer in the right gluteal fold. He has been tolerating the dressing changes without complication which is good news. The Endoform does seem to be helping although he was a little bit more macerated this week. This seems to be an ongoing issue with fluid control at this point. Nonetheless I think we may be able to add something like Drawtex to help control the drainage. 05/03/18 on evaluation today patient appears  to actually be doing better in regard to the overall appearance of his wound. He has been tolerating the dressing changes without complication. Fortunately there appears to be no evidence of infection at this time. I really feel like his wound has shown signs as of today of turning around last week I thought so as well and definitely he could be seen in this week's overall appearance and measurements. In general I'm very pleased with the fact that he finally seems to be making a steady but sure progress. The patient likewise is very pleased. 05/17/18 on evaluation today patient appears to be doing more poorly unfortunately in regard to his ulcer. He has been tolerating the dressing changes without complication. With that being said he tells me that in the past couple of days he and his wife have noticed that we did not seem to be doing quite as well is getting dark near the center. Subsequently upon evaluation today the wound actually does appear to be doing worse compared to previous. He has been tolerating the dressing changes otherwise and he states that he is not been sitting up anymore than he was in the past from what he tells me. Still he has continued to work he states "I'm tired of dealing with this and if I have to just go home and lay in the bed all the time that's what I'll do". Nonetheless I am concerned about the fact that this wound does appear to be deeper than what it was previous. 05/24/18 upon evaluation today patient actually presents after having been in the hospital due to what was presumed to be sepsis secondary to the wound infection. He had an elevated white blood cell count between 14 and 15. With that being said he  does seem to be doing somewhat better now. His wound still is giving him some trouble nonetheless and he is obviously concerned about the fact likely talked about that this does seem to go more deeply than previously noted. I did review his wound culture which  showed evidence of Staphylococcus aureus him and group B strep. Nonetheless he is on antibiotics, Levaquin, for this. Subsequently I did review his intake summary from the hospital as well. I also did look at the CT of the lumbar spine with contrast that was performed which showed no bone destruction to suggest lumbar disguises/osteomyelitis or sacral osteomyelitis. There was no paraspinal abscess. Nonetheless it appears this may have been more of just a soft tissue infection at this point which is good news. He still is nonetheless concerned about the wound which again I think is completely reasonable considering everything he's been through recently. 05/31/18 on evaluation today on evaluation today patient actually appears to be showing signs of his wound be a little bit deeper than what I would like to see. Fortunately he does not show any signs of significant infection although his temperature was 99 today he states he's been checking this at home and has not been elevated. Nonetheless with the undermining that I'm seeing at this point I am becoming more concerned about the wound I do think that offloading is a key factor here that is preventing the speedy recovery at this point. There does not appear to be any evidence of again over infection noted. He's been using Santyl currently. PEDROHENRIQUE, METAYER ([id number]) 06/07/18 the patient presents today for follow-up evaluation regarding the left ulcer in the gluteal region. He has been tolerating the Wound VAC fairly well. He is obviously very frustrated with this he states that to mean is really getting in his way. There does not appear to be any evidence of infection at this time he does have a little bit of odor I do not necessarily associate this with infection just something that we sometimes notice with Wound VAC therapy. With that being said I can definitely catch a tone of discontentment overall in the patient's demeanor today. This when  he was previously in the hospital an CT scan was done of the lumbar region which did not reveal any signs of osteomyelitis. With that being said the pelvis in particular was not evaluated distinctly which means he could still have some osteonecrosis I. Nonetheless the Wound VAC was started on Thursday I do want to get this little bit more time before jumping to a CT scan of the pelvis although that is something that I might would recommend if were not see an improvement by that time. 06/14/18 on evaluation today patient actually appears to be doing about the same in regard to his right gluteal ulcer. Again he did have a CT scan of the lumbar spine unfortunately this did not include the pelvis. Nonetheless with the depth of the wound that I'm seeing today even despite the fact that I'm not seeing any evidence of overt cellulitis I believe there's a good chance that we may be dealing with osteomyelitis somewhere in the right Ischial region. No fevers, chills, nausea, or vomiting noted at this time. 06/21/18 on evaluation today patient actually appears to be doing about the same with regard to his wound. The tunnel at 6 o'clock really does not appear to be any deeper although it is a little bit wider. I think at this point you may want to start  packing this with white phone. Unfortunately I have not got approval for the CT scan of the pelvis as of yet due to the fact that Medicare apparently has been denied it due to the diagnosis codes not being appropriate according to Medicare for the test requested. With that being said the patient cannot have an MRI and therefore this is the only option that we have as far as testing is concerned. The patient has had infection and was on antibiotics and been added code for cellulitis of the bottom to see if this will be appropriate for getting the test approved. Nonetheless I'm concerned about the infection have been spread deeper into the Ischial region. 06/28/18  on evaluation today patient actually appears to be doing rather well all things considered in regard to the right gluteal ulcer. He has been tolerating the dressing changes without complication. With that being said the Wound VAC he states does have to be replaced almost every day or at least reinforced unfortunately. Patient actually has his CT scan later this morning we should have the results by tomorrow. 07/05/18 on evaluation today patient presents for follow-up concerning his right Ischial ulcer. He did see the surgeon Dr. Lysle Pearl last week. They were actually very happy with him and felt like he spent a tremendous amount of time with them as far as discussing his situation was concerned. In the end Dr. Lysle Pearl did contact me as well and determine that he would not recommend any surgical intervention at this point as he felt like it would not be in the patient's best interest based on what he was seeing. He recommended a referral to infectious disease. Subsequently this is something that Dr. Ines Bloomer office is working on setting up for the patient. As far as evaluation today is concerned the patient's wound actually appears to be worse at this point. I am concerned about how things are progressing and specifically about infection. I do not feel like it's the deeper but the area of depth is definitely widening which does have me concerned. No fevers, chills, nausea, or vomiting noted at this time. I think that we do need initiate antibiotic therapy the patient has an allow allergy to amoxicillin/penicillin he states that he gets a rash since childhood. Nonetheless she's never had the issues with Catholics or cephalosporins in general but he is aware of. 07/27/18 on evaluation today patient presents following admission to the hospital on 07/09/18. He was subsequently discharged on 07/20/18. On 07/15/18 the patient underwent irrigation and debridement was soft tissue biopsy and bone biopsy as well  as placement of a Wound VAC in the OR by Dr. Celine Ahr. During the hospital course the patient was placed on a Wound VAC and recommended follow up with surgery in three [name] actually with Dr. Delaine Lame who is infectious disease. The patient was on vancomycin during the hospital course. 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 He also had a bone culture which revealed evidence of methicillin-resistant staph aureus. He is updated CT scan 07/09/18 reveals that he had progression of the which was performed on wound to breakdown down to the trochanter where he actually had irregularities there as well suggestive of osteomyelitis. This was a change just since 9 December when we last performed a CT scan. Obviously this one had gone downhill quite significantly and rapidly. At this point upon evaluation I feel like in general the patient's wound seems to be doing fairly well all things considered upon  my evaluation today. Obviously this is larger and deeper than what I previously evaluated but at the same time he seems to be making some progress as far as the appearance of the granulation tissue is concerned. I'm happy in that regard. No fevers, chills, nausea, or vomiting noted at this time. He is on IV vancomycin and Rocephin at the facility. He is currently in [employer]. 08/03/18 upon evaluation today patient's wound appears to be doing better in regard to the overall appearance at this point in time. Fortunately he's been tolerating the Wound VAC without complication and states that the facility has been taking CYRIE, GAMARRA. ([id number]) excellent care of the wound site. Overall I see some Slough noted on the surface which I am going to attempt sharp debridement today of but nonetheless other than this I feel like he's making progress. 08/09/18 on evaluation today patient's wound appears to be doing much better compared to even last week's evaluation.  Do believe that the Wound VAC is been of great benefit for him. He has been tolerating the dressing changes that is the Wound VAC without any complication and he has excellent granulation noted currently. There is no need for sharp debridement at this point. 08/16/18 on evaluation today patient actually appears to be doing very well in regard to the wound in the right gluteal fold region. This is showing signs of progress and again appears to be very healthy which is excellent news. Fortunately there is no sign of active infection by way of odor or drainage at this point. Overall I'm very pleased with how things stand. He seems to be tolerating the Wound VAC without complication. 08/23/18 on evaluation today patient actually appears to be doing better in regard to his wound. He has been tolerating the Wound VAC without complication and in fact it has been collecting a significant amount of drainage which I think is good news especially considering how the wound appears. Fortunately there is no signs of infection at this time definitely nothing appears to be worse which is good news. He has not been started on the Bactrim and Flagyl that was recommended by Dr. Delaine Lame yet. I did actually contact her office this morning in order to check and see were things are that regard their gonna be calling me back. 08/30/18 on evaluation today patient actually appears to show signs of excellent improvement today compared to last evaluation. The undermining is getting much better the wound seems to be feeling quite nicely and I'm very pleased that the granulation in general. With that being said overall I feel like the patient has made excellent progress which is great news. No fevers, chills, nausea, or vomiting noted at this time. 09/06/18 on evaluation today patient actually appears to be doing rather well in regard to his right gluteal ulcer. This is showing signs of improvement in overall I'm very pleased  with how things seem to be progressing. The patient likewise is please. Overall I see no evidence of infection he is about to complete his oral antibiotic regimen which is the end of the antibiotics for him in just about three days. 09/13/18 on evaluation today patient's right Ischial ulcer appears to be showing signs of continued improvement which is excellent news. He's been tolerating the dressing changes without complication. Fortunately there's no signs of infection and the wound that seems to be doing very well. 09/28/18 on evaluation today patient appears to be doing rather well in regard to his right Ischial  ulcer. He's been tolerating the Wound VAC without complication he knows there's much less drainage than there used to be this obviously is not a bad thing in my pinion. There's no evidence of infection despite the fact is but nothing about it now for several [name]. 10/04/18 on evaluation today patient appears to be doing better in regard to his right Ischial wound. He has been tolerating the Wound VAC without complication and I do believe that the silver nitrate last week was beneficial for him. Fortunately overall there's no evidence of active infection at this time which is great news. No fevers, chills, nausea, or vomiting noted at this time. 10/11/18 on evaluation today patient actually appears to be doing rather well in regard to his Ischial ulcer. He's been tolerating the Wound VAC still without complication I feel like this is doing a good job. No fevers, chills, nausea, or vomiting noted at this time. 11/01/18 on evaluation today patient presents after having not been seen in our clinic for several [name] secondary to the fact that he was on evaluation today patient presents after having not been seen in our clinic for several [name] secondary to the fact that he was in a skilled nursing facility which was on lockdown currently due to the covert 19 national emergency. Subsequently he  was discharged from the facility on this past Friday and subsequently made an appointment to come in to see yesterday. Fortunately there's no signs of active infection at this time which is good news and overall he does seem to have made progress since I last saw. Overall I feel like things are progressing quite nicely. The patient is having no pain. 11/08/18 on evaluation today patient appears to be doing okay in regard to his right gluteal ulcer. He has been utilizing a Wound VAC home health this changing this at this point since he's home from the skilled nursing facility. Fortunately there's no signs of obvious active infection at this time. Unfortunately though there's no obvious active infection he is having some maceration and his wife states that when the sheets of the Wound VAC office on Sunday when it broke seal that he ended up having significant issues with some smell as well there concerned about the possibility of infection. Fortunately there's No fevers, chills, nausea, or vomiting noted at this time. DONOVIN, LAMACCHIA ([id number]) 11/15/18 on evaluation today patient actually appears to be doing well in regard to his right gluteal ulcer. He has been tolerating the dressing changes without complication. Specifically the Wound VAC has been utilized up to this point. Fortunately there's no signs of infection and overall I feel like he has made progress even since last week when I last saw him. I'm actually fairly happy with the overall appearance although he does seem to have somewhat of a hyper granular overgrowth in the central portion of the wound which I think may require some sharp debridement to try flatness out possibly utilizing chemical cauterization following. 11/23/18 on evaluation today patient actually appears to be doing very well in regard to his sacral ulcer. He seems to be showing signs of improvement with good granulation. With that being said he still has the small area  of hyper granulation right in the central portion of the wound which I'm gonna likely utilize silver nitrate on today. Subsequently he also keeps having a leak at the 6 o'clock location which is unfortunate we may be able to help out with some suggestions to try to prevent this  going forward. Fortunately there's no signs of active infection at this time. 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 He's been tolerating the dressing changes without complication. Fortunately there's no signs of active infection at this time. I've been rather pleased with how things have progressed there still some evidence of pressure getting to the area with some redness right around the immediate wound opening. Nonetheless other than this I'm not seeing any significant complications or issues the wound is somewhat hyper granular. Upon discussing with the patient and his wife today I'm not sure that the wound is being packed to the base with the foam at this point. And if it's not been packed fully that may be part of the reason why is not seen as much improvement as far as the granulation from the base out. Again we do not want pack too tightly but we need some of the firm to get to the base of the wound. I discussed this with patient and his wife today. 12/06/18 on evaluation today patient appears to be doing well in regard to his right gluteal pressure ulcer. He's been tolerating the dressing changes without complication. Fortunately there's no signs of active infection. He still has some hyper granular tissue and I do think it would be appropriate to continue with the chemical cauterization as of today. 12/16/18 on evaluation today patient actually appears to be doing okay in regard to his right gluteal ulcer. He is been tolerating the dressing changes without complication including the Wound VAC. Overall I feel like  nothing seems to be worsening I do feel like that the hyper granulation buds in the central portion of the wound have improved to some degree with the silver nitrate. We will have to see how things continue to progress. 12/20/18 on evaluation today patient actually appears to be doing much worse in my pinion even compared to last week's evaluation. Unfortunately as opposed to showing any signs of improvement the areas of hyper granular tissue in the central portion of the wound seem to be getting worse. Subsequently the wound bed itself also seems to be getting deeper even compared to last week which is both unusual as well as concerning since prior he had been shown signs of improvement. Nonetheless I think that the issue could be that he's actually having some difficulty in issues with a deeper infection. There's no external signs of infection but nonetheless I am more worried about the internal, osteomyelitis, that could be restarting. He has not been on antibiotics for some time at this point. I think that it may be a good idea to go ahead and started back on an antibiotic therapy while we wait to see what the testing shows. 12/27/18 on evaluation today patient presents for follow-up concerning his left gluteal fold wound. Fortunately he appears to be doing well today. I did review the CT scan which was negative for any signs of osteomyelitis or acute abnormality this is excellent news. Overall I feel like the surface of the wound bed appears to be doing significantly better today compared to previously noted findings. There does not appear any signs of infection nor does he have any pain at this time. 01/03/19 on evaluation today patient actually appears to be doing quite well in regard to his ulcer. Post debridement last week he really did not have too much bleeding which is good news. Fortunately today  this seems to be doing some better but we still has some of the hyper granular tissue noted in  the base of the wound which is gonna require sharp debridement today as well. Overall I'm pleased with how things seem to be progressing since we switched away from the Wound VAC I think he is making some progress. 01/10/19 on evaluation today patient appears to be doing better in regard to his right gluteal fold ulcer. He has been tolerating the dressing changes without complication. The debridement to seem to be helping with current away some of the poor hyper granular tissue bugs throughout the region of his gluteal fold wound. He's been tolerating the dressing changes otherwise without complication which is great news. No fevers, chills, nausea, or vomiting noted at this time. 01/17/19 on evaluation today patient actually appears to be doing excellent in regard to his wound. He's been tolerating the dressing changes without complication. Fortunately there is no signs of active infection at this time which is great news. No fevers, chills, nausea, or vomiting noted at this time. GRAFTON, LEHRKE ([id number]) 01/24/19 on evaluation today patient actually appears to be doing quite well with regard to his ulcer. He has been tolerating the dressing changes without complication. Fortunately there's no signs of active infection at this time. Overall been very pleased with the progress that he seems to be making currently. 01/31/19-Patient returns at 1 week with apparent similarity in dimensions to the wound, with no signs of infection, he has been changing dressings twice a day 02/08/19 upon evaluation today patient actually appears to be doing well with regard to his right Ischial ulcer. The wound is not appear to be quite as deep and seems to be making progress which is good news. With that being said I'm still reluctant to go back to the Wound VAC at this point. He's been having to change the dressings twice a day which is a little bit much in my pinion from the wound care supplies standpoint. I  think that possibly attempting to utilize extras orbit may be beneficial this may also help to prevent any additional breakdown secondary to fluid retention in the wound itself. The patient is in agreement with giving this a try. 02/15/19 on evaluation today patient actually appears to be doing decently well with regard to his ulcer in the right to gluteal fold location. He's been tolerating the dressing changes without complication. Fortunately there is no signs of active infection at this time. He is able to keep the current dressing in place more effectively for a day at a time whereas before he was having a changes to to three times a day. The actions or has been helpful in this regard. Fortunately there's no signs of anything getting worse and I do feel like he showing signs of good improvement with regard to the wound bed status. 02/22/2019 patient appears to be doing very well today with regard to his ulcer in the gluteal fold. Fortunately there is no signs of active infection and he has been tolerating the dressing changes without any complication. Overall extremely pleased with how things seem to be progressing. He has much less of the hyper granular projections within the wound these have slowly been debrided away and he seems to be doing well. The wound bed is more uniform. 03/01/19 on evaluation today patient appears to be doing unfortunately about the same in regard to his gluteal ulcer. He's been tolerating the dressing changes without complication. Fortunately there's  no signs of active infection at this time. With that being said he continues to develop these hyper granular projections which I'm unsure of exactly what they are and why they are rising. Nonetheless I explained to the patient that I do believe it would be a good idea for [place] to stand a biopsy sample for pathology to see if that can shed any light on what exactly may be going on here. Fortunately I do not see any obvious  signs of infection. With that being said the patient has had a little bit more drainage this week apparently compared to last week. 03/08/2019 on evaluation today patient actually appears to look somewhat better with regard to the appearance of his wound bed at this time. This is good news. Overall I am very pleased with how things seem to have progressed just in the past week with a switch to the [facility] dressing. I think that has been beneficial for him. With that being said at this time the patient is concerned about his biopsy that I sent off last week unfortunately I do not have that report as of yet. Nonetheless we have called to obtain this and hopefully will hear back from the lab later this morning. 03/15/19 on evaluation today patient's wound actually appears to be doing okay today with regard to the overall appearance of the wound bed. He has been tolerating the dressing changes without complication he still has hyper granular tissue noted but fortunately that seems to be minimal at this point compared to some of what we've seen in the past. Nonetheless I do think that he is still having some issues currently with some of this type of granulation the biopsy and since all showed nothing more than just evidence of granulation tissue. Therefore there really is nothing different to initiate or do at this point. 03/24/19 on evaluation today patient appears to be doing a little better with regard to his ulcer. He's been tolerating the dressing changes without complication. Fortunately there is no signs of active infection at this time. No fevers, chills, nausea, or vomiting noted at this time. I'm overall pleased with how things seem to be progressing. 03/29/2019 on evaluation today patient appears to be doing about the same in regard to his ulcer in the right gluteal fold. Unfortunately he is not seeming to make a lot of progress and the wound is somewhat stalled. There is no signs of  active infection externally but I am concerned about the possibility of infection continuing in the ischial location which previously he did have infection noted. Again is not able to have an MRI so probably our best option for testing for this would be a triple phase bone scan which will detect subtle changes in the bone more so than plain film x-rays. In the past they really have not been beneficial and in fact the CT scans even have been somewhat questionable at times. Nonetheless there is no signs of systemic infection which is at least good news but again his wound is not healing at all on the predicted schedule. 04/05/19 on evaluation today patient appears to be doing well all things considering with regard to his wound and the right gluteal fold. He actually has his triple bone scan scheduled for sometime in the next couple of days. With that being said I've also been looking to other possibilities of what could be causing hyper granular tissue were looking into the bone scan again for evaluating for the risk  or possibility of infection deeper and I'm also gonna go ahead and see about obtaining a deep tissue Thalmann, Terre J. ([id number]) culture today to send and see if there's any evidence of infection noted on culture. He's in agreement with that plan. 04/12/2019 on evaluation today patient presents for reevaluation here in our clinic concerning ongoing issues with his right gluteal fold ulcer. 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 It was discussed with him at that point that I think it would be appropriate for him to proceed with hyperbaric oxygen therapy especially in light of the fact that he is previously been on IV antibiotics at the beginning of the year for close to 3 months followed by several [name] of oral antibiotics that was all prescribed by infectious  disease. He had surgical debridement around Christmas December 2019 due to an abscess and osteomyelitis of the ischial bone. Unfortunately this has not really proceeded as well as we would have liked and again we did a CT scan even a couple months ago as he cannot have an MRI secondary to having issues with both a pain pump as well as a spinal cord stimulator which prevent him from going into an MRI machine. With that being said there were chronic changes noted at the ischial tuberosity which had progressed since December 2019 there was no evidence of fluid collection on that initial CT scan. With that being said that was on January 21, 2019. I am not sure that it was a sensitive enough test as compared to an MRI and then subsequently I ordered a bone scan for him which was actually completed on 03/29/2019 and this revealed that he does indeed have positive osteomyelitis involving the right ischial tuberosity. This is adjacent to the ulcer and I think is the reason that his ulcer is not healing. Subsequently I am in a place him back on oral antibiotics today unfortunately his wound culture showed just mixed gram-negative flora with no specific findings of a predominant organism. Nonetheless being with the location I think a good broad-spectrum antibiotic for gram-negative's is a good choice at this point he is previously taken Levaquin without significant issues and I think that is an appropriate antibiotic for him at this time. 04/19/2019 on evaluation today patient actually appears to be doing fairly well with regard to his wound. He has been tolerating the dressing changes without complication. Fortunately there is no signs of active infection and he has been taking the oral antibiotics at this time. Subsequently we did make a referral to infectious disease although Dr. Steva Ready wants the patient to be seen by general surgery first in order apparently to see if there is anything from a surgical  standpoint that should be done prior to initiation of IV antibiotic therapy. Again the patient is okay with actually seeing Dr. Celine Ahr whom he has seen before we will make that referral. Patient History Information obtained from Patient. Family History Hypertension - Father, Stroke - Mother, No family history of Cancer, Diabetes, Heart Disease, Kidney Disease, Lung Disease, Seizures, Thyroid Problems, Tuberculosis. Social History Never smoker, Marital Status - Married, Alcohol Use - Never, Drug Use - No History, Caffeine Use - Daily. Medical History Eyes Patient has history of Cataracts - both removed Denies history of Glaucoma, Optic Neuritis Ear/Nose/Mouth/Throat Denies history of Chronic sinus problems/congestion, Middle ear problems Hematologic/Lymphatic Denies history of Anemia, Hemophilia,  Human Immunodeficiency Virus, Lymphedema Respiratory Denies history of Aspiration, Asthma, Chronic Obstructive Pulmonary Disease (COPD), Pneumothorax, Sleep Apnea, Tuberculosis Cardiovascular Patient has history of Hypertension - takes medication Denies history of Angina, Arrhythmia, Congestive Heart Failure, Coronary Artery Disease, Deep Vein Thrombosis, Hypotension, Myocardial Infarction, Peripheral Arterial Disease, Peripheral Venous Disease, Phlebitis, Vasculitis Gastrointestinal Denies history of Cirrhosis , Colitis, Crohn s, Hepatitis A, Hepatitis B, Hepatitis C Endocrine Denies history of Type I Diabetes, Type II Diabetes Genitourinary Denies history of End Stage Renal Disease TREMEL, FOLKERTS ([id number]) Immunological Denies history of Lupus Erythematosus, Raynaud s, Scleroderma Integumentary (Skin) Denies history of History of Burn, History of pressure wounds Musculoskeletal Denies history of Gout, Rheumatoid Arthritis, Osteoarthritis, Osteomyelitis Neurologic Patient has history of Paraplegia - waist down Denies history of Dementia, Neuropathy, Quadriplegia, Seizure  Disorder Oncologic Denies history of Received Chemotherapy, Received Radiation Psychiatric Denies history of Anorexia/bulimia, Confinement Anxiety Medical And Surgical History Notes Oncologic Prostate cancer- currently treated with horomone therapy Review of Systems (ROS) Constitutional Symptoms (General Health) Denies complaints or symptoms of Fatigue, Fever, Chills, Marked Weight Change. Respiratory Denies complaints or symptoms of Chronic or frequent coughs, Shortness of Breath. Cardiovascular Denies complaints or symptoms of Chest pain, LE edema. Psychiatric Denies complaints or symptoms of Anxiety, Claustrophobia. Objective Constitutional Well-nourished and well-hydrated in no acute distress. Vitals Time Taken: 10:40 AM, Height: 73 in, Weight: 210 lbs, BMI: 27.7, Temperature: 98.1 F, Pulse: 72 bpm, Respiratory Rate: 16 breaths/min, Blood Pressure: 112/68 mmHg. Respiratory normal breathing without difficulty. clear to auscultation bilaterally. Cardiovascular regular rate and rhythm with normal S1, S2. Psychiatric this patient is able to make decisions and demonstrates good insight into disease process. Alert and Oriented x 3. pleasant and cooperative. General Notes: Patient's wound bed currently still shows some hyper granulation although there does not appear to be any progression and in fact overall the wound surface seems to be doing much better which is good news. My hope is that with the hyperbarics coupled with antibiotics that the infection will improve. We will see if Dr. Celine Ahr feels like she needs to do DEVANSH, KEGEL. ([id number]) anything from a surgical standpoint further at this point Integumentary (Hair, Skin) Wound #1 status is Open. Original cause of wound was Pressure Injury. The wound is located on the Right Gluteal fold. The wound measures 4.5cm length x 1.5cm width x 2.4cm depth; 5.301cm^2 area and 12.723cm^3 volume. There is muscle and Fat Layer  (Subcutaneous Tissue) Exposed exposed. There is no undermining noted, however, there is tunneling at 12:00 with a maximum distance of 4.3cm. There is a large amount of serosanguineous drainage noted. The wound margin is epibole. There is medium (34-66%) red, pink, hyper - granulation within the wound bed. There is a medium (34-66%) amount of necrotic tissue within the wound bed including Adherent Slough. 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 Skin Barriers/Peri-Wound Care: Wound #1 Right Gluteal fold: Other: - Apply zinc oxide to peri-wound area. Primary Wound Dressing: Wound #1 Right Gluteal fold: Hydrafera Blue Ready Transfer Secondary Dressing: Wound #1 Right Gluteal fold: XtraSorb - Secure with Tegaderm Dressing Change Frequency: Wound #1 Right  Gluteal fold: Change dressing every day. Follow-up Appointments: Wound #1 Right Gluteal fold: Return Appointment in 1 week. 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 I  am in a suggest currently we go ahead make a referral to Dr. Celine Ahr for evaluation of the patient's wound to see whether or not any additional surgical intervention is necessitated. 2. Depending on the evaluation with Dr. Celine Ahr will then reach out to infectious disease again to see if we can schedule the patient for an appointment. 3. I am also going to suggest that we continue with hyperbaric oxygen therapy patient seems to be tolerating that well he just charted yesterday obviously we have not had enough time to see if this is helping to improve the overall wound status. 4. With regard to offloading the patient has done very well continue to offload that is absolutely appropriate. We will see patient back for reevaluation in 1 week here in the clinic. If anything worsens or changes patient will contact our office for additional recommendations. Electronic Signature(s) Signed: 04/19/2019 1:05:44 PM By: Brett Keeler [name] Entered By: [name] on 04/19/2019 13:05:43 KIYAN, MCCLAVE ([id number]) -------------------------------------------------------------------------------- ROS/PFSH Details Patient Name: [name] Date of Service: 04/19/2019 10:30 AM Medical Record Number: [id number] Patient Account Number: [id number] Date of Birth/Sex: [birth year] (68 y.o. M) Treating RN: [name] Primary Care Provider: Tracie [name] Other Clinician: Referring Provider: Tracie [name] Treating Provider/Extender: [name], [name] in Treatment: 70 Information Obtained From Patient Constitutional Symptoms (General Health) Complaints and Symptoms: Negative for: Fatigue; Fever; Chills; Marked Weight Change Respiratory Complaints and Symptoms: Negative for: Chronic or frequent coughs; Shortness of Breath Medical History: Negative for: Aspiration; Asthma; Chronic Obstructive Pulmonary Disease (COPD); Pneumothorax; Sleep Apnea; Tuberculosis Cardiovascular Complaints  and Symptoms: Negative for: Chest pain; LE edema Medical History: Positive for: Hypertension - takes medication Negative for: Angina; Arrhythmia; Congestive Heart Failure; Coronary Artery Disease; Deep Vein Thrombosis; Hypotension; Myocardial Infarction; Peripheral Arterial Disease; Peripheral Venous Disease; Phlebitis; Vasculitis Psychiatric Complaints and Symptoms: Negative for: Anxiety; Claustrophobia Medical History: Negative for: Anorexia/bulimia; Confinement Anxiety Eyes Medical History: Positive for: Cataracts - both removed Negative for: Glaucoma; Optic Neuritis Ear/Nose/Mouth/Throat Medical History: Negative for: Chronic sinus problems/congestion; Middle ear problems Hematologic/Lymphatic Medical History: Negative for: Anemia; Hemophilia; Human Immunodeficiency Virus; Lymphedema FEDERICK, DACE. ([id number]) Gastrointestinal Medical History: Negative for: Cirrhosis ; Colitis; Crohnos; Hepatitis A; Hepatitis B; Hepatitis C Endocrine Medical History: Negative for: Type I Diabetes; Type II Diabetes Genitourinary Medical History: Negative for: End Stage Renal Disease Immunological Medical History: Negative for: Lupus Erythematosus; Raynaudos; Scleroderma Integumentary (Skin) Medical History: Negative for: History of Burn; History of pressure wounds Musculoskeletal Medical History: Negative for: Gout; Rheumatoid Arthritis; Osteoarthritis; Osteomyelitis Neurologic Medical History: Positive for: Paraplegia - waist down Negative for: Dementia; Neuropathy; Quadriplegia; Seizure Disorder Oncologic Medical History: Negative for: Received Chemotherapy; Received Radiation Past Medical History Notes: Prostate cancer- currently treated with horomone therapy HBO Extended History Items Eyes: Cataracts Immunizations Pneumococcal Vaccine: Received Pneumococcal Vaccination: No Implantable Devices No devices added Family and Social History Cancer: No; Diabetes: No;  Heart Disease:  No; Hypertension: Yes - Father; Kidney Disease: No; Lung Disease: No; Seizures: No; Stroke: Yes - Mother; Thyroid Problems: No; Tuberculosis: No; Never smoker; Marital Status - Married; Alcohol Use: Never; Drug Use: No History; Caffeine Use: Daily PLEZ, HUNDLEY ([id number]) Physician Affirmation I have reviewed and agree with the above information. Electronic Signature(s) Signed: 04/19/2019 4:22:21 PM By: [name] Signed: 04/20/2019 9:08:00 AM By: Brett Keeler [name] Entered By: [name] on 04/19/2019 13:04:23 TAIM, MILIA ([id number]) -------------------------------------------------------------------------------- SuperBill Details Patient Name: [name] Date of Service: 04/19/2019 Medical Record Number: [id number] Patient Account Number: [id number] Date of Birth/Sex: 1950-09-24 (68 y.o. 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

## 2019-04-19 NOTE — Progress Notes (Signed)
[name] ([id number]) Visit Report for 04/19/2019 HBO Details Patient Name: [name], [name] Date of Service: 04/19/2019 8:00 AM Medical Record Number: [id number] Patient Account Number: [id number] Date of Birth/Sex: April 26, 1951 (68 y.o. 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 HBO Treatment Details Treatment Number: 2 Patient Type: Outpatient Chamber Type: Monoplace Chamber Serial #: [id number] Treatment Protocol: 2.0 ATA with 90 minutes oxygen, and no air breaks Treatment Details Compression Rate Down: 1.0 psi / minute De-Compression Rate Up: 1.5 psi / minute Compress Tx Pressure Air breaks and breathing periods Decompress Decompress Begins Reached (leave unused spaces blank) Begins Ends Chamber Pressure (ATA) 1 2 - - - - - - 2 1 Clock Time (24 hr) 08:27 08:43 - - - - - - 10:13 10:23 Treatment Length: 116 (minutes) Treatment Segments: 4 Vital Signs Capillary Blood Glucose Reference Range: 80 - 120 mg / dl HBO Diabetic Blood Glucose Intervention Range: <131 mg/dl or >249 mg/dl Time Vitals Blood Respiratory Capillary Blood Glucose Pulse Action Type: Pulse: Temperature: Taken: Pressure: Rate: Glucose (mg/dl): Meter #: Oximetry (%) Taken: Pre 08:20 124/62 60 16 99.3 Post 10:40 112/68 72 16 98.1 Treatment Response Treatment Toleration: Well Treatment Completion Treatment Completed without Adverse Event Status: HBO Attestation I certify that I supervised this HBO treatment in accordance with Medicare guidelines. A trained emergency response No team is readily available per hospital  policies and procedures. Continue HBOT as ordered. No Electronic Signature(s) LAYN, PLETT ([id number]) Signed: 04/19/2019 1:02:36 PM By: Worthy Keeler [name] Entered By: Worthy Keeler on 04/19/2019 13:02:36 ASIAH, REYNEN ([id number]) -------------------------------------------------------------------------------- HBO Safety Checklist Details Patient Name: [name] Date of Service: 04/19/2019 8:00 AM Medical Record Number: [id number] Patient Account Number: [id number] Date of Birth/Sex: 09/11/1950 (68 y.o. M) Treating RN: Montey Hora Primary Care Athelene Hursey: [name] Other Clinician: Referring Toby Ayad: [name] Treating Chatara Lucente/Extender: [name], HOYT Weeks in Treatment: 72 HBO Safety Checklist Items Safety Checklist Consent Form Signed Patient voided / foley secured and emptied When did you last eato morning Last dose of injectable or oral agent n/a Ostomy pouch emptied and vented if applicable NA Medtronic Baclofen Pump; Nevro All implantable devices assessed, documented and approved Spinal Stimulator Intravenous access site secured and place NA Valuables secured Linens and cotton and cotton/polyester blend (less than 51% polyester) Personal oil-based products / skin lotions / body lotions removed Wigs or hairpieces removed NA Smoking or tobacco materials removed NA Books / newspapers / magazines / loose paper removed Cologne, aftershave, perfume and deodorant removed Jewelry removed (may wrap wedding band) Make-up removed NA Hair care products removed Battery operated devices (external) removed Heating patches and chemical warmers removed Titanium eyewear removed NA Nail polish cured greater than 10 hours NA Casting material cured greater than 10 hours NA Hearing aids removed NA Loose dentures or partials removed NA Prosthetics have been removed NA Patient demonstrates correct use of air break device (if  applicable) Patient concerns have been addressed Patient grounding bracelet on and cord attached to chamber Specifics for Inpatients (complete in addition to above) Medication sheet sent with patient Intravenous medications needed or due during therapy sent with patient Drainage tubes (e.g. nasogastric tube or chest tube secured and vented) Endotracheal  or Tracheotomy tube secured Cuff deflated of air and inflated with saline Airway suctioned Electronic Signature(s) ZAFAR, GIANNI ([id number]) Signed: 04/19/2019 2:59:53 PM By: Lorine Bears RCP, RRT, CHT Entered By: Lorine Bears on 04/19/2019 08:46:33

## 2019-04-19 NOTE — Progress Notes (Signed)
DEVAUGHN, STRODTMAN ([id number]) Visit Report for 04/19/2019 Arrival Information Details Patient Name: [name], [name] Date of Service: 04/19/2019 8:00 AM Medical Record Number: [id number] Patient Account Number: [id number] Date of Birth/Sex: [birth year] (68 y.o. M) Treating RN: Montey Hora Primary Care Krissy Orebaugh: [name] Other Clinician: Referring Bradee Common: [name] Treating Maxum Cassarino/Extender: [name], HOYT Weeks in Treatment: 72 Visit Information History Since Last Visit Added or deleted any medications: No Patient Arrived: Wheel Chair Any new allergies or adverse reactions: No Arrival Time: 08:10 Had a fall or experienced change in No Accompanied By: wife activities of daily living that may affect Transfer Assistance: Manual risk of falls: Patient Identification Verified: Yes Signs or symptoms of abuse/neglect since last visito No Secondary Verification Process Completed: Yes Hospitalized since last visit: No Patient Requires Transmission-Based No Implantable device outside of the clinic excluding No Precautions: cellular tissue based products placed in the center Patient Has Alerts: Yes since last visit: Patient Alerts: NOT Has Dressing in Place as Prescribed: Yes Diabetic Pain Present Now: Yes Electronic Signature(s) Signed: 04/19/2019 2:59:53 PM By: Lorine Bears RCP, RRT, CHT Entered By: Lorine Bears on 04/19/2019 08:44:08 [name], [name] ([id number]) -------------------------------------------------------------------------------- Vitals Details Patient Name: [name] Date of Service: 04/19/2019 8:00 AM Medical Record Number: [id number] Patient Account Number: [id number] Date of Birth/Sex: [birth year] (68 y.o. M) Treating RN: Montey Hora Primary Care Alyzabeth Pontillo: [name] Other Clinician: Referring Rukaya Kleinschmidt: [name] Treating Haden Cavenaugh/Extender: [name], HOYT Weeks in Treatment:  72 Vital Signs Time Taken: 08:20 Temperature (F): 99.3 Height (in): 73 Pulse (bpm): 60 Weight (lbs): 210 Respiratory Rate (breaths/min): 16 Body Mass Index (BMI): 27.7 Blood Pressure (mmHg): 124/62 Reference Range: 80 - 120 mg / dl Electronic Signature(s) Signed: 04/19/2019 2:59:53 PM By: Lorine Bears RCP, RRT, CHT Entered By: Lorine Bears on 04/19/2019 08:44:51

## 2019-04-20 ENCOUNTER — Other Ambulatory Visit: Payer: Self-pay

## 2019-04-20 ENCOUNTER — Encounter: Payer: Medicare Other | Admitting: Internal Medicine

## 2019-04-20 DIAGNOSIS — L89314 Pressure ulcer of right buttock, stage 4: Secondary | ICD-10-CM | POA: Diagnosis not present

## 2019-04-20 NOTE — Progress Notes (Signed)
CHESLEY, FERRARE ([id number]) Visit Report for 04/20/2019 Arrival Information Details Patient Name: [name], [name] Date of Service: 04/20/2019 8:00 AM Medical Record Number: [id number] Patient Account Number: [id number] Date of Birth/Sex: 08-05-1950 (68 y.o. M) Treating RN: Primary Care Sheridyn Canino: [name] Other Clinician: Referring Vaishnavi Dalby: [name] Treating Alleta Avery/Extender: [name] in Treatment: 16 Visit Information History Since Last Visit Added or deleted any medications: No Patient Arrived: Wheel Chair Any new allergies or adverse reactions: No Arrival Time: 08:00 Had a fall or experienced change in No Accompanied By: wife activities of daily living that may affect Transfer Assistance: Manual risk of falls: Patient Identification Verified: Yes Signs or symptoms of abuse/neglect since last visito No Secondary Verification Process Completed: Yes Hospitalized since last visit: No Patient Requires Transmission-Based No Implantable device outside of the clinic excluding No Precautions: cellular tissue based products placed in the center Patient Has Alerts: Yes since last visit: Patient Alerts: NOT Has Dressing in Place as Prescribed: Yes Diabetic Pain Present Now: No Electronic Signature(s) Signed: 04/20/2019 3:36:43 PM By: Lorine Bears RCP, RRT, CHT Entered By: Lorine Bears on 04/20/2019 08:25:41 [name], [name] ([id number]) -------------------------------------------------------------------------------- Encounter Discharge Information Details Patient Name: [name] Date of Service: 04/20/2019 8:00 AM Medical Record Number: [id number] Patient Account Number: [id number] Date of Birth/Sex: [birth year] (68 y.o. M) Treating RN: Primary Care Tymeshia Awan: [name] Other Clinician: Referring Numa Heatwole: [name] Treating Akeen Ledyard/Extender: [name] in Treatment:  49 Encounter Discharge Information Items Discharge Condition: Stable Ambulatory Status: Wheelchair Discharge Destination: Home Transportation: Private Auto Accompanied By: wife Schedule Follow-up Appointment: No Clinical Summary of Care: Notes Patient has an HBO treatment scheduled on 04/21/2019 at 08:00 am. Electronic Signature(s) Signed: 04/20/2019 3:36:43 PM By: Lorine Bears RCP, RRT, CHT Entered By: Lorine Bears on 04/20/2019 14:29:43 [name], [name] ([id number]) -------------------------------------------------------------------------------- Vitals Details Patient Name: [name] Date of Service: 04/20/2019 8:00 AM Medical Record Number: [id number] Patient Account Number: [id number] Date of Birth/Sex: 26-Jun-1951 (68 y.o. M) Treating RN: Primary Care Deaglan Lile: [name] Other Clinician: Referring Jesenya Bowditch: [name] Treating Jeanell Mangan/Extender: [name] in Treatment: 72 Vital Signs Time Taken: 08:15 Temperature (F): 99.0 Height (in): 73 Pulse (bpm): 60 Weight (lbs): 210 Respiratory Rate (breaths/min): 16 Body Mass Index (BMI): 27.7 Blood Pressure (mmHg): 122/64 Reference Range: 80 - 120 mg / dl Electronic Signature(s) Signed: 04/20/2019 3:36:43 PM By: Lorine Bears RCP, RRT, CHT Entered By: Lorine Bears on 04/20/2019 08:26:16

## 2019-04-20 NOTE — Progress Notes (Signed)
CORI, WAISNER ([id number]) Visit Report for 04/20/2019 HBO Details Patient Name: [name], [name] Date of Service: 04/20/2019 8:00 AM Medical Record Number: [id number] Patient Account Number: [id number] Date of Birth/Sex: 1950/09/17 (68 y.o. 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 HBO Treatment Details Treatment Number: 3 Patient Type: Outpatient Chamber Type: Monoplace Chamber Serial #: [id number] Treatment Protocol: 2.0 ATA with 90 minutes oxygen, and no air breaks Treatment Details Compression Rate Down: 1.0 psi / minute De-Compression Rate Up: 1.5 psi / minute Compress Tx Pressure Air breaks and breathing periods Decompress Decompress Begins Reached (leave unused spaces blank) Begins Ends Chamber Pressure (ATA) 1 2 - - - - - - 2 1 Clock Time (24 hr) 08:22 08:39 - - - - - - 10:09 10:19 Treatment Length: 117 (minutes) Treatment Segments: 4 Vital Signs Capillary Blood Glucose Reference Range: 80 - 120 mg / dl HBO Diabetic Blood Glucose Intervention Range: <131 mg/dl or >249 mg/dl Time Vitals Blood Respiratory Capillary Blood Glucose Pulse Action Type: Pulse: Temperature: Taken: Pressure: Rate: Glucose (mg/dl): Meter #: Oximetry (%) Taken: Pre 08:15 122/64 60 16 99 Post 10:30 116/64 60 16 98.5 Treatment Response Treatment Completion Status: Treatment Completed without Adverse Event Cira Deyoe Notes No concerns with treatment given HBO Attestation I certify that I supervised this HBO treatment in accordance with Medicare guidelines. A trained emergency response Yes team is readily available per  hospital policies and procedures. Continue HBOT as ordered. Yes Electronic Signature(s) LARRY, BARTOS ([id number]) Signed: 04/20/2019 4:51:07 PM By: Linton Ham MD Previous Signature: 04/20/2019 3:36:43 PM Version By: Lorine Bears RCP, RRT, CHT Entered By: Linton Ham on 04/20/2019 16:50:03 WARNE, JENNETTE ([id number]) -------------------------------------------------------------------------------- HBO Safety Checklist Details Patient Name: [name] Date of Service: 04/20/2019 8:00 AM Medical Record Number: [id number] Patient Account Number: [id number] Date of Birth/Sex: 02-14-1951 (68 y.o. M) Treating RN: Primary Care Nikitta Sobiech: [name] Other Clinician: Referring Latise Dilley: [name] Treating Oronde Hallenbeck/Extender: [name] in Treatment: 72 HBO Safety Checklist Items Safety Checklist Consent Form Signed Patient voided / foley secured and emptied When did you last eato morning Last dose of injectable or oral agent n/a Ostomy pouch emptied and vented if applicable NA Medtronic Baclofen Pump; Nevro All implantable devices assessed, documented and approved Spinal Stimulator Intravenous access site secured and place NA Valuables secured Linens and cotton and cotton/polyester blend (less than 51% polyester) Personal oil-based products / skin lotions / body lotions removed Wigs or hairpieces removed NA Smoking or tobacco materials removed NA Books / newspapers / magazines / loose paper removed Cologne, aftershave, perfume and deodorant removed Jewelry removed (may wrap wedding band) Make-up removed NA Hair care products removed Battery operated devices (external) removed Heating patches and chemical warmers removed Titanium eyewear removed NA Nail polish cured greater than 10 hours NA Casting material cured greater than 10 hours NA Hearing aids removed NA Loose dentures or partials  removed NA Prosthetics have been removed NA Patient demonstrates correct use of air break device (if applicable) Patient concerns have been addressed Patient grounding bracelet on and cord attached to chamber Specifics for Inpatients (complete in addition to above) Medication sheet sent with patient Intravenous medications needed or due during therapy sent with patient Drainage  tubes (e.g. nasogastric tube or chest tube secured and vented) Endotracheal or Tracheotomy tube secured Cuff deflated of air and inflated with saline Airway suctioned Electronic Signature(s) ROBERTJAMES, EISEL ([id number]) Signed: 04/20/2019 3:36:43 PM By: Lorine Bears RCP, RRT, CHT Entered By: Lorine Bears on 04/20/2019 08:27:51

## 2019-04-21 ENCOUNTER — Encounter: Payer: Medicare Other | Attending: Physician Assistant | Admitting: Physician Assistant

## 2019-04-21 DIAGNOSIS — Z79899 Other long term (current) drug therapy: Secondary | ICD-10-CM | POA: Diagnosis not present

## 2019-04-21 DIAGNOSIS — Z8249 Family history of ischemic heart disease and other diseases of the circulatory system: Secondary | ICD-10-CM | POA: Diagnosis not present

## 2019-04-21 DIAGNOSIS — L89314 Pressure ulcer of right buttock, stage 4: Secondary | ICD-10-CM | POA: Diagnosis not present

## 2019-04-21 DIAGNOSIS — G8220 Paraplegia, unspecified: Secondary | ICD-10-CM | POA: Diagnosis not present

## 2019-04-21 DIAGNOSIS — I10 Essential (primary) hypertension: Secondary | ICD-10-CM | POA: Insufficient documentation

## 2019-04-21 DIAGNOSIS — C61 Malignant neoplasm of prostate: Secondary | ICD-10-CM | POA: Diagnosis not present

## 2019-04-21 DIAGNOSIS — L03317 Cellulitis of buttock: Secondary | ICD-10-CM | POA: Insufficient documentation

## 2019-04-21 DIAGNOSIS — M8668 Other chronic osteomyelitis, other site: Secondary | ICD-10-CM | POA: Insufficient documentation

## 2019-04-21 DIAGNOSIS — Z881 Allergy status to other antibiotic agents status: Secondary | ICD-10-CM | POA: Insufficient documentation

## 2019-04-21 DIAGNOSIS — Z880 Allergy status to penicillin: Secondary | ICD-10-CM | POA: Diagnosis not present

## 2019-04-21 NOTE — Progress Notes (Signed)
Brett, [name] ([id number]) Visit Report for 04/21/2019 Arrival Information Details Patient Name: [name], [name] Date of Service: 04/21/2019 8:00 AM Medical Record Number: [id number] Patient Account Number: [id number] Date of Birth/Sex: 15-Apr-1951 (68 y.o. M) Treating RN: Primary Care Kyrielle Urbanski: [name] Other Clinician: Referring Jaquaya Coyle: [name] Treating Judeth Gilles/Extender: [name], HOYT Weeks in Treatment: 72 Visit Information History Since Last Visit Added or deleted any medications: No Patient Arrived: Wheel Chair Any new allergies or adverse reactions: No Arrival Time: 08:09 Had a fall or experienced change in No Accompanied By: wife activities of daily living that may affect Transfer Assistance: Manual risk of falls: Patient Identification Verified: Yes Signs or symptoms of abuse/neglect since last visito No Secondary Verification Process Completed: Yes Hospitalized since last visit: No Patient Requires Transmission-Based No Implantable device outside of the clinic excluding No Precautions: cellular tissue based products placed in the center Patient Has Alerts: Yes since last visit: Patient Alerts: NOT Has Dressing in Place as Prescribed: Yes Diabetic Pain Present Now: No Electronic Signature(s) Signed: 04/21/2019 4:27:18 PM By: Lorine Bears RCP, RRT, CHT Entered By: Lorine Bears on 04/21/2019 08:22:26 ALFONSE, ROBE ([id number]) -------------------------------------------------------------------------------- Encounter Discharge Information Details Patient Name: [name] Date of Service: 04/21/2019 8:00 AM Medical Record Number: [id number] Patient Account Number: [id number] Date of Birth/Sex: [birth year] (68 y.o. M) Treating RN: Primary Care Tajae Rybicki: [name] Other Clinician: Referring Marrissa Dai: [name] Treating Arriyana Rodell/Extender: [name], HOYT Weeks in Treatment:  30 Encounter Discharge Information Items Discharge Condition: Stable Ambulatory Status: Wheelchair Discharge Destination: Home Transportation: Private Auto Accompanied By: wife Schedule Follow-up Appointment: No Clinical Summary of Care: Notes Patient has an HBO treatment scheduled on 04/22/2019 at 08:00 am. Electronic Signature(s) Signed: 04/21/2019 4:27:18 PM By: Lorine Bears RCP, RRT, CHT Entered By: Lorine Bears on 04/21/2019 12:31:27 MIA, SEEGARS ([id number]) -------------------------------------------------------------------------------- Vitals Details Patient Name: [name] Date of Service: 04/21/2019 8:00 AM Medical Record Number: [id number] Patient Account Number: [id number] Date of Birth/Sex: 03-22-1951 (68 y.o. M) Treating RN: Primary Care Yolande Skoda: [name] Other Clinician: Referring Alik Mawson: [name] Treating Shanise Balch/Extender: [name], HOYT Weeks in Treatment: 72 Vital Signs Time Taken: 08:10 Temperature (F): 99.1 Height (in): 73 Pulse (bpm): 78 Weight (lbs): 210 Respiratory Rate (breaths/min): 16 Body Mass Index (BMI): 27.7 Blood Pressure (mmHg): 130/68 Reference Range: 80 - 120 mg / dl Electronic Signature(s) Signed: 04/21/2019 4:27:18 PM By: Lorine Bears RCP, RRT, CHT Entered By: Becky Sax, Amado Nash on 04/21/2019 08:42:27

## 2019-04-22 ENCOUNTER — Other Ambulatory Visit: Payer: Self-pay

## 2019-04-22 ENCOUNTER — Encounter: Payer: Medicare Other | Admitting: Physician Assistant

## 2019-04-22 DIAGNOSIS — L89314 Pressure ulcer of right buttock, stage 4: Secondary | ICD-10-CM | POA: Diagnosis not present

## 2019-04-22 NOTE — Progress Notes (Signed)
[name], [name] ([id number]) Visit Report for 04/21/2019 HBO Details Patient Name: [name], [name] Date of Service: 04/21/2019 8:00 AM Medical Record Number: [id number] Patient Account Number: [id number] Date of Birth/Sex: 23-Aug-1950 (68 y.o. 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 HBO Treatment Details Treatment Number: 4 Patient Type: Outpatient Chamber Type: Monoplace Chamber Serial #: [id number] Treatment Protocol: 2.0 ATA with 90 minutes oxygen, and no air breaks Treatment Details Compression Rate Down: 1.5 psi / minute De-Compression Rate Up: 1.5 psi / minute Compress Tx Pressure Air breaks and breathing periods Decompress Decompress Begins Reached (leave unused spaces blank) Begins Ends Chamber Pressure (ATA) 1 2 - - - - - - 2 1 Clock Time (24 hr) 08:20 08:32 - - - - - - 10:02 10:10 Treatment Length: 110 (minutes) Treatment Segments: 4 Vital Signs Capillary Blood Glucose Reference Range: 80 - 120 mg / dl HBO Diabetic Blood Glucose Intervention Range: <131 mg/dl or >249 mg/dl Time Vitals Blood Respiratory Capillary Blood Glucose Pulse Action Type: Pulse: Temperature: Taken: Pressure: Rate: Glucose (mg/dl): Meter #: Oximetry (%) Taken: Pre 08:10 130/68 78 16 99.1 Post 10:15 114/68 60 16 98.2 Treatment Response Treatment Toleration: Well Treatment Completion Treatment Completed without Adverse Event Status: HBO Attestation I certify that I supervised this HBO treatment in accordance with Medicare guidelines. A trained emergency response Yes team is readily available per hospital policies and  procedures. Continue HBOT as ordered. Yes Electronic Signature(s) TEDDIE, KIMBERLEY ([id number]) Signed: 04/21/2019 5:49:00 PM By: Worthy Keeler [name] Previous Signature: 04/21/2019 4:27:18 PM Version By: Lorine Bears RCP, RRT, CHT Entered By: Worthy Keeler on 04/21/2019 17:49:00 [name], [name] ([id number]) -------------------------------------------------------------------------------- HBO Safety Checklist Details Patient Name: [name] Date of Service: 04/21/2019 8:00 AM Medical Record Number: [id number] Patient Account Number: [id number] Date of Birth/Sex: [birth year] (68 y.o. M) Treating RN: Primary Care Archie Shea: [name] Other Clinician: Referring Edna Grover: [name] Treating Deniya Craigo/Extender: [name], HOYT Weeks in Treatment: 72 HBO Safety Checklist Items Safety Checklist Consent Form Signed Patient voided / foley secured and emptied When did you last eato morning Last dose of injectable or oral agent n/a Ostomy pouch emptied and vented if applicable NA All implantable devices assessed, documented and approved NA Intravenous access site secured and place NA Valuables secured Linens and cotton and cotton/polyester blend (less than 51% polyester) Personal oil-based products / skin lotions / body lotions removed Wigs or hairpieces removed NA Smoking or tobacco materials removed NA Books / newspapers / magazines / loose paper removed Cologne, aftershave, perfume and deodorant removed Jewelry removed (may wrap wedding band) Make-up removed NA Hair care products removed Battery operated devices (external) removed Heating patches and chemical warmers removed Titanium eyewear removed NA Nail polish cured greater than 10 hours NA Casting material cured greater than 10 hours NA Hearing aids removed NA Loose dentures or partials removed NA Prosthetics have been removed NA Patient demonstrates correct use of air  break device (if applicable) Patient concerns have been addressed Patient grounding bracelet on and cord attached to chamber Specifics for Inpatients (complete in addition to above) Medication sheet sent with patient Intravenous medications needed or due during therapy sent with patient Drainage tubes (e.g. nasogastric tube or chest tube  secured and vented) Endotracheal or Tracheotomy tube secured Cuff deflated of air and inflated with saline Airway suctioned Electronic Signature(s) Signed: 04/21/2019 4:27:18 PM By: Lorine Bears RCP, RRT, CHT [name], [name] ([id number]) Entered By: Lorine Bears on 04/21/2019 08:43:36

## 2019-04-22 NOTE — Progress Notes (Signed)
JAHIER, LUNDAY ([id number]) Visit Report for 04/22/2019 Arrival Information Details Patient Name: LEWELL, BERDECIA Date of Service: 04/22/2019 8:00 AM Medical Record Number: [id number] Patient Account Number: [id number] Date of Birth/Sex: 1951-03-16 (68 y.o. M) Treating RN: Primary Care Gwendalyn Mcgonagle: [name] Other Clinician: Referring Mossie Gilder: [name] Treating Erwin Nishiyama/Extender: [name], HOYT Weeks in Treatment: 72 Visit Information History Since Last Visit Added or deleted any medications: No Patient Arrived: Wheel Chair Any new allergies or adverse reactions: No Arrival Time: 08:00 Had a fall or experienced change in No Accompanied By: wife activities of daily living that may affect Transfer Assistance: Manual risk of falls: Patient Identification Verified: Yes Signs or symptoms of abuse/neglect since last visito No Secondary Verification Process Completed: Yes Hospitalized since last visit: No Patient Requires Transmission-Based No Implantable device outside of the clinic excluding No Precautions: cellular tissue based products placed in the center Patient Has Alerts: Yes since last visit: Patient Alerts: NOT Has Dressing in Place as Prescribed: Yes Diabetic Pain Present Now: No Electronic Signature(s) Signed: 04/22/2019 11:55:44 AM By: Lorine Bears RCP, RRT, CHT Entered By: Lorine Bears on 04/22/2019 08:01:54 [name] ([id number]) -------------------------------------------------------------------------------- Encounter Discharge Information Details Patient Name: [name] Date of Service: 04/22/2019 8:00 AM Medical Record Number: [id number] Patient Account Number: [id number] Date of Birth/Sex: Oct 13, 1950 (68 y.o. M) Treating RN: Primary Care Abraham Margulies: [name] Other Clinician: Referring Airyanna Dipalma: [name] Treating Casara Perrier/Extender: [name], HOYT Weeks in Treatment:  44 Encounter Discharge Information Items Discharge Condition: Stable Ambulatory Status: Wheelchair Discharge Destination: Home Transportation: Private Auto Accompanied By: wife Schedule Follow-up Appointment: No Clinical Summary of Care: Notes Patient has an HBO treatment scheduled on 04/25/2019 at 08:00 am. Electronic Signature(s) Signed: 04/22/2019 11:55:44 AM By: Lorine Bears RCP, RRT, CHT Entered By: Lorine Bears on 04/22/2019 11:55:26 NASYR, ENCARNACION ([id number]) -------------------------------------------------------------------------------- Vitals Details Patient Name: [name] Date of Service: 04/22/2019 8:00 AM Medical Record Number: [id number] Patient Account Number: [id number] Date of Birth/Sex: 1950/10/10 (68 y.o. M) Treating RN: Primary Care Lotus Gover: [name] Other Clinician: Referring Mela Perham: [name] Treating Mavryk Pino/Extender: [name], HOYT Weeks in Treatment: 72 Vital Signs Time Taken: 08:10 Temperature (F): 98.5 Height (in): 73 Pulse (bpm): 66 Weight (lbs): 210 Respiratory Rate (breaths/min): 16 Body Mass Index (BMI): 27.7 Blood Pressure (mmHg): 120/66 Reference Range: 80 - 120 mg / dl Electronic Signature(s) Signed: 04/22/2019 11:55:44 AM By: Lorine Bears RCP, RRT, CHT Entered By: Lorine Bears on 04/22/2019 08:22:19

## 2019-04-22 NOTE — Progress Notes (Signed)
LEILAN, CALEB ([id number]) Visit Report for 04/22/2019 Problem List Details Patient Name: [name], [name] Date of Service: 04/22/2019 8:00 AM Medical Record Number: [id number] Patient Account Number: [id number] Date of Birth/Sex: 01/03/1951 (68 y.o. 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, sequela I10 Essential (primary) hypertension Facility Procedures CPT4 Code:  [id number] Description: (Facility Use Only) HBOT, full body chamber, 45min Modifier: Quantity: 4 Physician Procedures CPT4 Code: [id number] Description: [id number] - WC PHYS HYPERBARIC OXYGEN THERAPY ICD-10 Diagnosis Description M86.68 Other chronic osteomyelitis, other site L89.314 Pressure ulcer of right buttock, stage 4 G82.20 Paraplegia, unspecified S34.109S Unspecified injury to unspecified  level of lumbar spinal cord, Modifier: sequela Quantity: 1 Electronic Signature(s) Signed: 04/22/2019 4:07:44 PM By: Worthy Keeler [name] Previous Signature: 04/22/2019 11:55:44 AM Version By: Lorine Bears RCP, RRT, CHT Entered By: Worthy Keeler on 04/22/2019 16:07:44

## 2019-04-22 NOTE — Progress Notes (Signed)
[name], [name] ([id number]) Visit Report for 04/21/2019 Problem List Details Patient Name: [name], [name] Date of Service: 04/21/2019 8:00 AM Medical Record Number: [id number] Patient Account Number: [id number] Date of Birth/Sex: May 11, 1951 (68 y.o. 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, sequela I10 Essential (primary) hypertension Facility Procedures CPT4 Code:  [id number] Description: (Facility Use Only) HBOT, full body chamber, 64min Modifier: Quantity: 4 Physician Procedures CPT4 Code: [id number] Description: [id number] - WC PHYS HYPERBARIC OXYGEN THERAPY ICD-10 Diagnosis Description M86.68 Other chronic osteomyelitis, other site L89.314 Pressure ulcer of right buttock, stage 4 G82.20 Paraplegia, unspecified S34.109S Unspecified injury to unspecified  level of lumbar spinal cord, Modifier: sequela Quantity: 1 Electronic Signature(s) Signed: 04/21/2019 5:49:06 PM By: Worthy Keeler [name] Previous Signature: 04/21/2019 4:27:18 PM Version By: Lorine Bears RCP, RRT, CHT Entered By: Worthy Keeler on 04/21/2019 17:49:06

## 2019-04-22 NOTE — Progress Notes (Signed)
KEERAN, PREVAL ([id number]) Visit Report for 04/22/2019 HBO Details Patient Name: [name], [name] Date of Service: 04/22/2019 8:00 AM Medical Record Number: [id number] Patient Account Number: [id number] Date of Birth/Sex: 1950/12/04 (68 y.o. 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 HBO Treatment Details Treatment Number: 5 Patient Type: Outpatient Chamber Type: Monoplace Chamber Serial #: [id number] Treatment Protocol: 2.0 ATA with 90 minutes oxygen, and no air breaks Treatment Details Compression Rate Down: 1.5 psi / minute De-Compression Rate Up: 1.5 psi / minute Compress Tx Pressure Air breaks and breathing periods Decompress Decompress Begins Reached (leave unused spaces blank) Begins Ends Chamber Pressure (ATA) 1 2 - - - - - - 2 1 Clock Time (24 hr) 08:21 08:32 - - - - - - 10:03 10:13 Treatment Length: 112 (minutes) Treatment Segments: 4 Vital Signs Capillary Blood Glucose Reference Range: 80 - 120 mg / dl HBO Diabetic Blood Glucose Intervention Range: <131 mg/dl or >249 mg/dl Time Vitals Blood Respiratory Capillary Blood Glucose Pulse Action Type: Pulse: Temperature: Taken: Pressure: Rate: Glucose (mg/dl): Meter #: Oximetry (%) Taken: Pre 08:10 120/66 66 16 98.5 Post 10:20 114/64 66 16 98 Treatment Response Treatment Toleration: Well Treatment Completion Treatment Completed without Adverse Event Status: HBO Attestation I certify that I supervised this HBO treatment in accordance with Medicare guidelines. A trained emergency response Yes team is readily available per hospital policies and  procedures. Continue HBOT as ordered. Yes Electronic Signature(s) KONER, FRERKING ([id number]) Signed: 04/22/2019 4:07:38 PM By: Worthy Keeler [name] Previous Signature: 04/22/2019 11:55:44 AM Version By: Lorine Bears RCP, RRT, CHT Entered By: Worthy Keeler on 04/22/2019 16:07:38 KONSTANTIN, HEIGES ([id number]) -------------------------------------------------------------------------------- HBO Safety Checklist Details Patient Name: [name] Date of Service: 04/22/2019 8:00 AM Medical Record Number: [id number] Patient Account Number: [id number] Date of Birth/Sex: 1950/09/17 (68 y.o. M) Treating RN: Primary Care Santanna Olenik: [name] Other Clinician: Referring Almer Littleton: [name] Treating Rateel Beldin/Extender: [name], HOYT Weeks in Treatment: 72 HBO Safety Checklist Items Safety Checklist Consent Form Signed Patient voided / foley secured and emptied When did you last eato morning Last dose of injectable or oral agent n/a Ostomy pouch emptied and vented if applicable NA Medtronic Baclofen Pump; Nevro All implantable devices assessed, documented and approved Spinal Stimulator Intravenous access site secured and place NA Valuables secured Linens and cotton and cotton/polyester blend (less than 51% polyester) Personal oil-based products / skin lotions / body lotions removed Wigs or hairpieces removed NA Smoking or tobacco materials removed NA Books / newspapers / magazines / loose paper removed Cologne, aftershave, perfume and deodorant removed Jewelry removed (may wrap wedding band) Make-up removed NA Hair care products removed Battery operated devices (external) removed Heating patches and chemical warmers removed Titanium eyewear removed NA Nail polish cured greater than 10 hours NA Casting material cured greater than 10 hours NA Hearing aids removed NA Loose dentures or partials removed NA Prosthetics have been  removed NA Patient demonstrates correct use of air break device (if applicable) Patient concerns have been addressed Patient grounding bracelet on and cord attached to chamber Specifics for Inpatients (complete in addition to above) Medication sheet sent with patient Intravenous medications needed or due during therapy sent with patient Drainage tubes (e.g.  nasogastric tube or chest tube secured and vented) Endotracheal or Tracheotomy tube secured Cuff deflated of air and inflated with saline Airway suctioned Electronic Signature(s) PETTER, SWARD ([id number]) Signed: 04/22/2019 11:55:44 AM By: Lorine Bears RCP, RRT, CHT Entered By: Lorine Bears on 04/22/2019 08:23:29

## 2019-04-25 ENCOUNTER — Encounter: Payer: Medicare Other | Admitting: Physician Assistant

## 2019-04-25 ENCOUNTER — Other Ambulatory Visit: Payer: Self-pay

## 2019-04-25 DIAGNOSIS — L89314 Pressure ulcer of right buttock, stage 4: Secondary | ICD-10-CM | POA: Diagnosis not present

## 2019-04-25 NOTE — Progress Notes (Signed)
QUAVIS, BANDUCCI ([id number]) Visit Report for 04/25/2019 Arrival Information Details Patient Name: [name], [name] Date of Service: 04/25/2019 8:00 AM Medical Record Number: [id number] Patient Account Number: [id number] Date of Birth/Sex: 11/16/1950 (68 y.o. M) Treating RN: Primary Care Charday Capetillo: [name] Other Clinician: Referring Porcha Deblanc: [name] Treating Sophiarose Eades/Extender: [name], HOYT Weeks in Treatment: 60 Visit Information History Since Last Visit Added or deleted any medications: No Patient Arrived: Wheel Chair Any new allergies or adverse reactions: No Arrival Time: 08:02 Had a fall or experienced change in No Accompanied By: wife activities of daily living that may affect Transfer Assistance: Manual risk of falls: Patient Identification Verified: Yes Signs or symptoms of abuse/neglect since last visito No Secondary Verification Process Completed: Yes Hospitalized since last visit: No Patient Requires Transmission-Based No Implantable device outside of the clinic excluding No Precautions: cellular tissue based products placed in the center Patient Has Alerts: Yes since last visit: Patient Alerts: NOT Has Dressing in Place as Prescribed: Yes Diabetic Pain Present Now: No Electronic Signature(s) Signed: 04/25/2019 2:52:22 PM By: Lorine Bears RCP, RRT, CHT Entered By: Lorine Bears on 04/25/2019 08:37:43 HOWARD, BRUER ([id number]) -------------------------------------------------------------------------------- Encounter Discharge Information Details Patient Name: [name] Date of Service: 04/25/2019 8:00 AM Medical Record Number: [id number] Patient Account Number: [id number] Date of Birth/Sex: 30-Jul-1950 (68 y.o. M) Treating RN: Primary Care Khamia Stambaugh: [name] Other Clinician: Referring Meilech Virts: [name] Treating Lizmarie Witters/Extender: [name], HOYT Weeks in Treatment:  109 Encounter Discharge Information Items Discharge Condition: Stable Ambulatory Status: Wheelchair Discharge Destination: Home Transportation: Private Auto Accompanied By: daughter, Joelene Millin Schedule Follow-up Appointment: No Clinical Summary of Care: Electronic Signature(s) Signed: 04/25/2019 2:52:22 PM By: Lorine Bears RCP, RRT, CHT Entered By: Lorine Bears on 04/25/2019 10:28:01 RAYSHAWN, ROSATO ([id number]) -------------------------------------------------------------------------------- Vitals Details Patient Name: [name] Date of Service: 04/25/2019 8:00 AM Medical Record Number: [id number] Patient Account Number: [id number] Date of Birth/Sex: [birth year] (68 y.o. M) Treating RN: Primary Care Kadir Azucena: [name] Other Clinician: Referring Dessirae Scarola: [name] Treating Jessica Checketts/Extender: [name], HOYT Weeks in Treatment: 22 Vital Signs Time Taken: 08:12 Temperature (F): 98.2 Height (in): 73 Pulse (bpm): 72 Weight (lbs): 210 Respiratory Rate (breaths/min): 16 Body Mass Index (BMI): 27.7 Blood Pressure (mmHg): 122/70 Reference Range: 80 - 120 mg / dl Electronic Signature(s) Signed: 04/25/2019 2:52:22 PM By: Lorine Bears RCP, RRT, CHT Entered By: Lorine Bears on 04/25/2019 08:38:18

## 2019-04-25 NOTE — Progress Notes (Addendum)
RAMIN, FUMERO ([id number]) Visit Report for 04/25/2019 HBO Details Patient Name: EUSEVIO, SABRA Date of Service: 04/25/2019 8:00 AM Medical Record Number: [id number] Patient Account Number: [id number] Date of Birth/Sex: 02-13-1951 (68 y.o. 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 HBO Treatment Details Treatment Number: 6 Patient Type: Outpatient Chamber Type: Monoplace Chamber Serial #: [id number] Treatment Protocol: 2.0 ATA with 90 minutes oxygen, and no air breaks Treatment Details Compression Rate Down: 1.5 psi / minute De-Compression Rate Up: 2.0 psi / minute Compress Tx Pressure Air breaks and breathing periods Decompress Decompress Begins Reached (leave unused spaces blank) Begins Ends Chamber Pressure (ATA) 1 2 - - - - - - 2 1 Clock Time (24 hr) 08:25 08:37 - - - - - - 10:07 10:15 Treatment Length: 110 (minutes) Treatment Segments: 4 Vital Signs Capillary Blood Glucose Reference Range: 80 - 120 mg / dl HBO Diabetic Blood Glucose Intervention Range: <131 mg/dl or >249 mg/dl Time Vitals Blood Respiratory Capillary Blood Glucose Pulse Action Type: Pulse: Temperature: Taken: Pressure: Rate: Glucose (mg/dl): Meter #: Oximetry (%) Taken: Pre 08:12 122/70 72 16 98.2 Post 10:20 120/72 72 16 97.9 Treatment Response Treatment Completion Status: Treatment Completed without Adverse Event HBO Attestation I certify that I supervised this HBO treatment in accordance with Medicare guidelines. A trained emergency response Yes team is readily available per hospital policies and procedures. Continue HBOT as  ordered. Yes Electronic Signature(s) Signed: 04/26/2019 5:57:54 PM By: Worthy Keeler [name] Previous Signature: 04/25/2019 2:52:22 PM Version By: Paulla Fore, RRT, CHT JOHNY, MISSEL ([id number]) Previous Signature: 04/25/2019 4:22:49 PM Version By: Worthy Keeler [name] Entered By: Worthy Keeler on 04/26/2019 17:57:54 EKAMJOT, BONIFACE ([id number]) -------------------------------------------------------------------------------- HBO Safety Checklist Details Patient Name: [name] Date of Service: 04/25/2019 8:00 AM Medical Record Number: [id number] Patient Account Number: [id number] Date of Birth/Sex: Nov 20, 1950 (68 y.o. M) Treating RN: Primary Care Nakita Santerre: [name] Other Clinician: Referring Saretta Dahlem: [name] Treating Croix Presley/Extender: [name], HOYT Weeks in Treatment: 87 HBO Safety Checklist Items Safety Checklist Consent Form Signed Patient voided / foley secured and emptied When did you last eato morning Last dose of injectable or oral agent n/a Ostomy pouch emptied and vented if applicable NA All implantable devices assessed, documented and approved NA Intravenous access site secured and place NA Valuables secured Linens and cotton and cotton/polyester blend (less than 51% polyester) Personal oil-based products / skin lotions / body lotions removed Wigs or hairpieces removed NA Smoking or tobacco materials removed NA Books / newspapers / magazines / loose paper removed Cologne, aftershave, perfume and deodorant removed Jewelry removed (may wrap wedding band) Make-up removed NA Hair care products removed Battery operated devices (external) removed Heating patches and chemical warmers removed Titanium eyewear removed NA Nail polish cured greater than 10 hours NA Casting material cured greater than 10 hours NA Hearing aids removed NA Loose dentures or partials removed NA Prosthetics have been  removed NA Patient demonstrates correct use of air break device (if applicable) Patient concerns have been addressed Patient grounding bracelet on and cord attached to chamber Specifics for Inpatients (complete in addition to above) Medication sheet sent with patient Intravenous medications needed or due during therapy sent with patient  Drainage tubes (e.g. nasogastric tube or chest tube secured and vented) Endotracheal or Tracheotomy tube secured Cuff deflated of air and inflated with saline Airway suctioned Electronic Signature(s) Signed: 04/25/2019 2:52:22 PM By: Lorine Bears RCP, RRT, CHT JADIR, RALPH ([id number]) Entered By: Lorine Bears on 04/25/2019 08:39:28

## 2019-04-26 ENCOUNTER — Encounter: Payer: Medicare Other | Admitting: Physician Assistant

## 2019-04-26 DIAGNOSIS — L89314 Pressure ulcer of right buttock, stage 4: Secondary | ICD-10-CM | POA: Diagnosis not present

## 2019-04-26 NOTE — Progress Notes (Signed)
[name], [name] ([id number]) Visit Report for 04/26/2019 Arrival Information Details Patient Name: [name], [name] Date of Service: 04/26/2019 10:30 AM Medical Record Number: [id number] Patient Account Number: [id number] Date of Birth/Sex: 02-03-1951 (68 y.o. M) Treating RN: [name] Primary Care [name]: [name] Other Clinician: Referring [name]: [name] [name], [name] Weeks in Treatment: 45 Visit Information History Since Last Visit Added or deleted any medications: No Patient Arrived: Wheel Chair Any new allergies or adverse reactions: No Arrival Time: 10:34 Had a fall or experienced change in No Accompanied By: wife activities of daily living that may affect Transfer Assistance: Manual risk of falls: Patient Identification Verified: Yes Signs or symptoms of abuse/neglect since last visito No Secondary Verification Process Completed: Yes Hospitalized since last visit: No Patient Requires Transmission-Based No Implantable device outside of the clinic excluding No Precautions: cellular tissue based products placed in the center Patient Has Alerts: Yes since last visit: Patient Alerts: NOT Has Dressing in Place as Prescribed: Yes Diabetic Pain Present Now: No Electronic Signature(s) Signed: 04/26/2019 4:06:56 PM By: [name] [name], [name], [name] Entered By: [name] on 04/26/2019 10:34:40 [name] ([id number]) -------------------------------------------------------------------------------- Clinic Level of Care Assessment Details Patient Name: [name] Date of Service: 04/26/2019 10:30 AM Medical Record Number: [id number] Patient Account Number: [id number] Date of Birth/Sex: January 20, 1951 (68 y.o. M) Treating RN: [name] Primary Care Britni Driscoll: [name] Other Clinician: Referring [name]: [name] Treating Keldon Lassen/Extender: [name],  [name] Weeks in Treatment: 64 Clinic Level of Care Assessment Items TOOL 4 Quantity Score []  - Use when only an EandM is performed on FOLLOW-UP visit 0 ASSESSMENTS - Nursing Assessment / Reassessment X - Reassessment of Co-morbidities (includes updates in patient status) 1 10 X- 1 5 Reassessment of Adherence to Treatment Plan ASSESSMENTS - Wound and Skin Assessment / Reassessment X - Simple Wound Assessment / Reassessment - one wound 1 5 []  - 0 Complex Wound Assessment / Reassessment - multiple wounds []  - 0 Dermatologic / Skin Assessment (not related to wound area) ASSESSMENTS - Focused Assessment []  - Circumferential Edema Measurements - multi extremities 0 []  - 0 Nutritional Assessment / Counseling / Intervention []  - 0 Lower Extremity Assessment (monofilament, tuning fork, pulses) []  - 0 Peripheral Arterial Disease Assessment (using hand held doppler) ASSESSMENTS - Ostomy and/or Continence Assessment and Care []  - Incontinence Assessment and Management 0 []  - 0 Ostomy Care Assessment and Management (repouching, etc.) PROCESS - Coordination of Care X - Simple Patient / Family Education for ongoing care 1 15 []  - 0 Complex (extensive) Patient / Family Education for ongoing care X- 1 10 Staff obtains Programmer, systems, Records, Test Results / Process Orders []  - 0 Staff telephones HHA, Nursing Homes / Clarify orders / etc []  - 0 Routine Transfer to another Facility (non-emergent condition) []  - 0 Routine Hospital Admission (non-emergent condition) []  - 0 New Admissions / Biomedical engineer / Ordering NPWT, Apligraf, etc. []  - 0 Emergency Hospital Admission (emergent condition) X- 1 10 Simple Discharge Coordination [name], [name]. ([id number]) []  - 0 Complex (extensive) Discharge Coordination PROCESS - Special Needs []  - Pediatric / Minor Patient Management 0 []  - 0 Isolation Patient Management []  - 0 Hearing / Language / Visual special needs []  - 0 Assessment of  Community assistance (transportation, D/C planning, etc.) []  - 0 Additional assistance / Altered mentation []  - 0 Support Surface(s) Assessment (bed, cushion, seat, etc.) INTERVENTIONS - Wound Cleansing / Measurement X - Simple Wound Cleansing -  one wound 1 5 []  - 0 Complex Wound Cleansing - multiple wounds X- 1 5 Wound Imaging (photographs - any number of wounds) []  - 0 Wound Tracing (instead of photographs) X- 1 5 Simple Wound Measurement - one wound []  - 0 Complex Wound Measurement - multiple wounds INTERVENTIONS - Wound Dressings X - Small Wound Dressing one or multiple wounds 1 10 []  - 0 Medium Wound Dressing one or multiple wounds []  - 0 Large Wound Dressing one or multiple wounds []  - 0 Application of Medications - topical []  - 0 Application of Medications - injection INTERVENTIONS - Miscellaneous []  - External ear exam 0 []  - 0 Specimen Collection (cultures, biopsies, blood, body fluids, etc.) []  - 0 Specimen(s) / Culture(s) sent or taken to Lab for analysis []  - 0 Patient Transfer (multiple staff / Civil Service fast streamer / Similar devices) []  - 0 Simple Staple / Suture removal (25 or less) []  - 0 Complex Staple / Suture removal (26 or more) []  - 0 Hypo / Hyperglycemic Management (close monitor of Blood Glucose) []  - 0 Ankle / Brachial Index (ABI) - do not check if billed separately X- 1 5 Vital Signs [name], [name] Has the patient been seen at the hospital within the last three years: Yes Total Score: 85 Level Of Care: New/Established - Level 3 Electronic Signature(s) Signed: 04/26/2019 4:54:06 PM By: [name] Entered By: [name] on 04/26/2019 11:15:52 [name] ([id number]) -------------------------------------------------------------------------------- Encounter Discharge Information Details Patient Name: [name] Date of Service: 04/26/2019 10:30 AM Medical Record Number: [id number] Patient Account Number:  [id number] Date of Birth/Sex: 1950-11-15 (68 y.o. M) Treating RN: [name] Primary Care [name]: [name] Other Clinician: Referring [name]: [name] [name], [name] Weeks in Treatment: 97 Encounter Discharge Information Items Discharge Condition: Stable Ambulatory Status: Wheelchair Discharge Destination: Home Transportation: Private Auto Accompanied By: wife Schedule Follow-up Appointment: Yes Clinical Summary of Care: Electronic Signature(s) Signed: 04/26/2019 4:54:06 PM By: [name] Entered By: [name] on 04/26/2019 11:16:51 [name] ([id number]) -------------------------------------------------------------------------------- Lower Extremity Assessment Details Patient Name: [name] Date of Service: 04/26/2019 10:30 AM Medical Record Number: [id number] Patient Account Number: [id number] Date of Birth/Sex: Mar 29, 1951 (68 y.o. M) Treating RN: [name] Primary Care Kenshawn Maciolek: [name] Other Clinician: Referring Chyanna Flock: [name] Treating Delvonte Berenson/Extender: Sharalyn Ink in Treatment: 32 Electronic Signature(s) Signed: 04/26/2019 5:14:16 PM By: Gretta Cool, BSN, RN, CWS, Kim RN, BSN Entered By: Gretta Cool, BSN, RN, CWS, Kim on 04/26/2019 10:44:44 [name], [name] ([id number]) -------------------------------------------------------------------------------- Multi Wound Chart Details Patient Name: [name] Date of Service: 04/26/2019 10:30 AM Medical Record Number: [id number] Patient Account Number: [id number] Date of Birth/Sex: 04-12-1951 (68 y.o. M) Treating RN: [name] Primary Care Naima Veldhuizen: [name] Other Clinician: Referring Kham Zuckerman: [name] Treating Moet Mikulski/Extender: [name], [name] Weeks in Treatment: 25 Vital Signs Height(in): 73 Pulse(bpm): 78 Weight(lbs): 210 Blood Pressure(mmHg): 124/70 Body Mass Index(BMI):  28 Temperature(F): 98.1 Respiratory Rate 16 (breaths/min): Photos: [1:No Photos] [N/A:N/A] Wound Location: [1:Right Gluteal fold] [N/A:N/A] Wounding Event: [1:Pressure Injury] [N/A:N/A] Primary Etiology: [1:Pressure Ulcer] [N/A:N/A] Comorbid History: [1:Cataracts, Hypertension, Paraplegia] [N/A:N/A] Date Acquired: [1:11/02/2017] [N/A:N/A] Weeks of Treatment: [1:73] [N/A:N/A] Wound Status: [1:Open] [N/A:N/A] Measurements L x W x D [1:2.8x3.6x2.4] [N/A:N/A] (cm) Area (cm) : [1:7.917] [N/A:N/A] Volume (cm) : [1:19] [N/A:N/A] % Reduction in Area: [1:21.20%] [N/A:N/A] % Reduction in Volume: [1:-1790.50%] [N/A:N/A] Position 1 (o'clock): [1:12] Maximum Distance 1 (cm): [1:4] Tunneling: [1:Yes] [N/A:N/A] Classification: [1:Category/Stage IV] [N/A:N/A] Exudate Amount: [  1:Large] [N/A:N/A] Exudate Type: [1:Serosanguineous] [N/A:N/A] Exudate Color: [1:red, brown] [N/A:N/A] Wound Margin: [1:Epibole] [N/A:N/A] Granulation Amount: [1:Medium (34-66%)] [N/A:N/A] Granulation Quality: [1:Red, Pink, Hyper-granulation] [N/A:N/A] Necrotic Amount: [1:Medium (34-66%)] [N/A:N/A] Exposed Structures: [1:Fat Layer (Subcutaneous Tissue) Exposed: Yes Muscle: Yes Fascia: No Tendon: No Joint: No Bone: No None] [N/A:N/A N/A] Treatment Notes [name], [name] ([id number]) Electronic Signature(s) Signed: 04/26/2019 4:54:06 PM By: [name] Entered By: [name] on 04/26/2019 11:08:22 [name] ([id number]) -------------------------------------------------------------------------------- [place] Details Patient Name: [name] Date of Service: 04/26/2019 10:30 AM Medical Record Number: [id number] Patient Account Number: [id number] Date of Birth/Sex: [birth year] (68 y.o. M) Treating RN: [name] Primary Care Aaban Griep: [name] Other Clinician: Referring Kripa Foskey: [name] Treating Angelea Penny/Extender: [name], [name] Weeks in  Treatment: 44 Active Inactive Orientation to the Wound Care Program Nursing Diagnoses: Knowledge deficit related to the wound healing center program Goals: Patient/caregiver will verbalize understanding of the [place] Program Date Initiated: 11/30/2017 Target Resolution Date: 12/21/2017 Goal Status: Active Interventions: Provide education on orientation to the wound center Notes: Pressure Nursing Diagnoses: Knowledge deficit related to management of pressures ulcers Goals: Patient/caregiver will verbalize understanding of pressure ulcer management Date Initiated: 05/17/2018 Target Resolution Date: 05/28/2018 Goal Status: Active Interventions: Assess: immobility, friction, shearing, incontinence upon admission and as needed Notes: Wound/Skin Impairment Nursing Diagnoses: Impaired tissue integrity Goals: Patient/caregiver will verbalize understanding of skin care regimen Date Initiated: 11/30/2017 Target Resolution Date: 12/21/2017 Goal Status: Active Ulcer/skin breakdown will have a volume reduction of 30% by week 4 Date Initiated: 11/30/2017 Target Resolution Date: 12/21/2017 Goal Status: Active [name], [name] ([id number]) Interventions: Assess patient/caregiver ability to obtain necessary supplies Assess patient/caregiver ability to perform ulcer/skin care regimen upon admission and as needed Assess ulceration(s) every visit Treatment Activities: Skin care regimen initiated : 11/30/2017 Notes: Electronic Signature(s) Signed: 04/26/2019 4:54:06 PM By: [name] Entered By: [name] on 04/26/2019 11:08:14 [name] ([id number]) -------------------------------------------------------------------------------- Pain Assessment Details Patient Name: [name] Date of Service: 04/26/2019 10:30 AM Medical Record Number: [id number] Patient Account Number: [id number] Date of Birth/Sex: 02-16-1951 (68 y.o. M) Treating RN: [name] Primary Care Eowyn Tabone: [name] Other Clinician: Referring Leana Springston: [name] Treating Angeleah Labrake/Extender: [name], [name] Weeks in Treatment: 66 Active Problems Location of Pain Severity and Description of Pain Patient Has Paino No Site Locations Pain Management and Medication Current Pain Management: Electronic Signature(s) Signed: 04/26/2019 4:06:56 PM By: Paulla Fore, [name], [name] Signed: 04/26/2019 4:54:06 PM By: [name] Entered By: [name] on 04/26/2019 10:34:51 [name], [name] ([id number]) -------------------------------------------------------------------------------- Patient/Caregiver Education Details Patient Name: [name] Date of Service: 04/26/2019 10:30 AM Medical Record Number: [id number] Patient Account Number: [id number] Date of Birth/Gender: Jan 05, 1951 (68 y.o. M) Treating RN: [name] Primary Care Physician: [name] Other Clinician: Referring Physician: Tracie [name] Treating Physician/Extender: Sharalyn Ink in Treatment: 29 Education Assessment Education Provided To: Patient and Caregiver Education Topics Provided Wound/Skin Impairment: Handouts: Other: wound care to continue as ordered Methods: Demonstration, Explain/Verbal Responses: State content correctly Electronic Signature(s) Signed: 04/26/2019 4:54:06 PM By: [name] Entered By: [name] on 04/26/2019 11:16:16 [name] ([id number]) -------------------------------------------------------------------------------- Wound Assessment Details Patient Name: [name] Date of Service: 04/26/2019 10:30 AM Medical Record Number: [id number] Patient Account Number: [id number] Date of Birth/Sex: 08/24/1950 (68 y.o. M) Treating RN: [name] Primary Care Priyansh Pry: [name] Other Clinician: Referring Eiden Bagot: [name] Treating Ayra Hodgdon/Extender: STONE  III, [name] Weeks in Treatment: 73 Wound  Status Wound Number: 1 Primary Etiology: Pressure Ulcer Wound Location: Right Gluteal fold Wound Status: Open Wounding Event: Pressure Injury Comorbid History: Cataracts, Hypertension, Paraplegia Date Acquired: 11/02/2017 Weeks Of Treatment: 73 Clustered Wound: No Wound Measurements Length: (cm) 2.8 Width: (cm) 3.6 Depth: (cm) 2.4 Area: (cm) 7.917 Volume: (cm) 19 % Reduction in Area: 21.2% % Reduction in Volume: -1790.5% Epithelialization: None Tunneling: Yes Position (o'clock): 12 Maximum Distance: (cm) 4 Wound Description Classification: Category/Stage IV Wound Margin: Epibole Exudate Amount: Large Exudate Type: Serosanguineous Exudate Color: red, brown Foul Odor After Cleansing: No Slough/Fibrino Yes Wound Bed Granulation Amount: Medium (34-66%) Exposed Structure Granulation Quality: Red, Pink, Hyper-granulation Fascia Exposed: No Necrotic Amount: Medium (34-66%) Fat Layer (Subcutaneous Tissue) Exposed: Yes Necrotic Quality: Adherent Slough Tendon Exposed: No Muscle Exposed: Yes Necrosis of Muscle: No Joint Exposed: No Bone Exposed: No Treatment Notes Wound #1 (Right Gluteal fold) Notes zinc to peri wound, Hydrafera Blue, xtrasorb and Advertising account executive) Signed: 04/26/2019 5:14:16 PM By: Gretta Cool, BSN, RN, CWS, Kim RN, BSN Entered By: Gretta Cool, BSN, RN, CWS, Kim on 04/26/2019 10:45:31 MURIL, [name] ([id number]) [name], [name] ([id number]) -------------------------------------------------------------------------------- Vitals Details Patient Name: [name] Date of Service: 04/26/2019 10:30 AM Medical Record Number: [id number] Patient Account Number: [id number] Date of Birth/Sex: [birth year] (68 y.o. M) Treating RN: [name] Primary Care Rozena Fierro: [name] Other Clinician: Referring Berea Majkowski: [name] Treating Brentlee Delage/Extender: [name], [name] Weeks in Treatment:  27 Vital Signs Time Taken: 10:15 Temperature (F): 98.1 Height (in): 73 Pulse (bpm): 78 Weight (lbs): 210 Respiratory Rate (breaths/min): 16 Body Mass Index (BMI): 27.7 Blood Pressure (mmHg): 124/70 Reference Range: 80 - 120 mg / dl Electronic Signature(s) Signed: 04/26/2019 4:06:56 PM By: [name] [name], [name], [name] Entered By: [name] on 04/26/2019 10:35:27

## 2019-04-26 NOTE — Progress Notes (Signed)
[name], [name] ([id number]) Visit Report for 04/26/2019 Arrival Information Details Patient Name: [name], [name] Date of Service: 04/26/2019 8:00 AM Medical Record Number: [id number] Patient Account Number: [id number] Date of Birth/Sex: 06/18/1951 (68 y.o. M) Treating RN: Primary Care Eluterio Seymour: [name] Other Clinician: Referring Rawlin Reaume: [name] Treating Zadiel Leyh/Extender: [name], HOYT Weeks in Treatment: 27 Visit Information History Since Last Visit Added or deleted any medications: No Patient Arrived: Wheel Chair Any new allergies or adverse reactions: No Arrival Time: 08:25 Had a fall or experienced change in No Accompanied By: wife activities of daily living that may affect Transfer Assistance: Manual risk of falls: Patient Identification Verified: Yes Signs or symptoms of abuse/neglect since last visito No Secondary Verification Process Completed: Yes Hospitalized since last visit: No Patient Requires Transmission-Based No Implantable device outside of the clinic excluding No Precautions: cellular tissue based products placed in the center Patient Has Alerts: Yes since last visit: Patient Alerts: NOT Has Dressing in Place as Prescribed: Yes Diabetic Pain Present Now: No Electronic Signature(s) Signed: 04/26/2019 4:06:56 PM By: Lorine Bears RCP, RRT, CHT Entered By: Lorine Bears on 04/26/2019 08:25:29 [name], [name] ([id number]) -------------------------------------------------------------------------------- Encounter Discharge Information Details Patient Name: [name] Date of Service: 04/26/2019 8:00 AM Medical Record Number: [id number] Patient Account Number: [id number] Date of Birth/Sex: 1951/01/25 (68 y.o. M) Treating RN: Primary Care Arilla Hice: [name] Other Clinician: Referring Elcie Pelster: [name] Treating Huy Majid/Extender: [name], HOYT Weeks in Treatment:  30 Encounter Discharge Information Items Discharge Condition: Stable Ambulatory Status: Wheelchair Discharge Destination: Home Transportation: Private Auto Accompanied By: wife Schedule Follow-up Appointment: Yes Clinical Summary of Care: Notes Patient has an HBO treatment scheduled on 04/27/2019 at 08:00 am. Electronic Signature(s) Signed: 04/26/2019 4:06:56 PM By: Lorine Bears RCP, RRT, CHT Entered By: Lorine Bears on 04/26/2019 10:28:21 [name], [name] ([id number]) -------------------------------------------------------------------------------- Vitals Details Patient Name: [name] Date of Service: 04/26/2019 8:00 AM Medical Record Number: [id number] Patient Account Number: [id number] Date of Birth/Sex: 1951/02/09 (68 y.o. M) Treating RN: Primary Care Khani Paino: [name] Other Clinician: Referring Zaara Sprowl: [name] Treating Ricci Paff/Extender: [name], HOYT Weeks in Treatment: 20 Vital Signs Time Taken: 08:10 Temperature (F): 98.7 Height (in): 73 Pulse (bpm): 72 Weight (lbs): 210 Respiratory Rate (breaths/min): 16 Body Mass Index (BMI): 27.7 Blood Pressure (mmHg): 122/68 Reference Range: 80 - 120 mg / dl Electronic Signature(s) Signed: 04/26/2019 4:06:56 PM By: Lorine Bears RCP, RRT, CHT Entered By: Becky Sax, Amado Nash on 04/26/2019 08:25:58

## 2019-04-26 NOTE — Progress Notes (Addendum)
[name] ([id number]) Visit Report for 04/26/2019 Chief Complaint Document Details Patient Name: [name] Date of Service: 04/26/2019 10:30 AM Medical Record Number: [id number] Patient Account Number: [id number] Date of Birth/Sex: 1950/12/13 (68 y.o. M) Treating RN: Montey Hora Primary Care Provider: [name] Other Clinician: Referring Provider: [name] Treating Provider/Extender: [name] Weeks in Treatment: 7 Information Obtained from: Patient Chief Complaint Right gluteal fold ulcer Electronic Signature(s) Signed: 04/26/2019 10:55:36 AM By: Worthy Keeler [name] Entered By: Worthy Keeler on 04/26/2019 10:55:36 [name] ([id number]) -------------------------------------------------------------------------------- HPI Details Patient Name: [name] Date of Service: 04/26/2019 10:30 AM Medical Record Number: [id number] Patient Account Number: [id number] Date of Birth/Sex: 1950/11/14 (68 y.o. 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 He states that there is no history of diabetes that he is aware of. He does have issues with his prostate and is currently receiving treatment for this by way of oral medication. With that being said I do not have a lot of details in that regard. Nonetheless the patient presents today as a result of having been referred to [place] by another provider initially home health was set to come out and take care of his wound although due to the fact  that he apparently drives he's not able to receive home health. His wife is therefore trying to help take care of this wound within although they have been struggling with what exactly to do at this point. She states that she can do some things but she is definitely not a nurse and does have some issues with looking at blood. The good news is the wound does not appear to be too deep and is fairly superficial at this point. There is no slough noted there is some nonviable skin noted around the surface of the wound and the perimeter at this point. The central portion of the wound appears to be very good with a dermal layer noted this does not appear to be again deep enough to extend it to subcutaneous tissue at this point. 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 In the pump he has Dilaudid and baclofen. 12/07/17 on evaluation today patient presents for follow-up concerning his ongoing lower back thigh ulcer on the right. He states that he did not get the supplies ordered and therefore has not really been able to perform the dressing changes as directed exactly. His wife was able to get some Boarder Foam Dressing's from the drugstore and subsequently has been using hydrogel which did help to a degree in the wound does appear to be able smaller. There is actually more drainage this week noted than previous. 12/21/17 on evaluation today patient appears to be doing rather well in regard to his right gluteal ulcer. He has been tolerating the dressing changes without complication. There does not appear to be any evidence of infection at this point in time. Overall the wound does seem to be making some progress as far as the edges  are concerned there's not as much in the way of overlapping of the external wound edges and he has a good epithelium to wound bed border for the most part. This however is not  true right at the 12 o'clock location over the span of a little over a centimeters which actually will require debridement today to clean this away and hopefully allow it to continue to heal more appropriately. 12/28/17 on evaluation today patient appears to be doing rather well in regard to his ulcer in the left gluteal region. He's been tolerating the dressing changes without complication. Apparently he has had some difficulty getting his dressing material. Apparently there's been some confusion with ordering we're gonna check into this. Nonetheless overall he's been showing signs of improvement which is good news. Debridement is not required today. 01/04/18 on evaluation today patient presents for follow-up concerning his right gluteal ulcer. He has been tolerating the dressing changes fairly well. On inspection today it appears he may actually have some maceration them concerned about the fact that he may be developing too much moisture in and around the wound bed which can cause delay in healing. With that being said he unfortunately really has not showed significant signs of improvement since last week's evaluation in fact this may even be just the little bit/slightly larger. Nonetheless he's been having a lot of discomfort I'm not sure this is even related to the wound as he has no pain when I'm to breeding or otherwise cleaning the wound during evaluation today. Nonetheless this is something that we did recommend he talked to his pain specialist concerning. 01/11/18 on evaluation today patient appears to be doing better in regard to his ulceration. He has been tolerating the dressing changes without complication. With that being said overall there's no evidence of infection which is good news. The only thing is he did receive the hatch affair blue classic versus the ready nonetheless I feel like this is perfectly fine and appears to have done well for him over the past week. 01/25/18 on  evaluation today patient's wound actually appears to be a little bit larger than during the last evaluation. The good [name], [name]. ([id number]) news is the majority of the wound edges actually appear to be fairly firmly attached to the wound bed unfortunately again we're not really making progress in regard to the size. Roughly the wound is about the same size as when I first saw him although again the wound margin/edges appear to be much better. 02/01/18 on evaluation today patient actually appears to be doing very well in regard to his wound. Applying the Prisma dry does seem to be better although he does still have issues with slow progression of the wound. There was a slight improvement compared to last week's measurements today. Nonetheless I have been considering other options as far as the possibility of Theraskin or even a snap vac. In general I'm not sure that the Theraskin due to location of the wound would be a very good idea. Nonetheless I do think that a snap vac could be a possibility for the patient and in fact I think this could even be an excellent way to manage the wound possibly seeing some improvement in a very rapid fashion here. Nonetheless this is something that we would need to get approved and I did have a lengthy conversation with the patient about this today. 02/08/18 on evaluation today patient appears to be doing a little better in regard to his ulcer.  He has been tolerating the dressing changes without complication. Fortunately despite the fact that the wound is a little bit smaller it's not significantly so unfortunately. We have discussed the possibility of a snap vac we did check with insurance this is actually covered at this point. Fortunately there does not appear to be any sign of infection. Overall I'm fairly pleased with how things seem to be appearing at this point. 02/15/18 on evaluation today patient appears to be doing rather well in regard to his right  gluteal ulcer. Unfortunately the snap vac did not stay in place with his sheer and friction this came loose and did not seem to maintain seal very well. He worked for about two days and it did seem to do very well during that time according to his wife but in general this does not seem to be something that's gonna be beneficial for him long-term. I do believe we need to go back to standard dressings to see if we can find something that will be of benefit. 03/02/18- He is here in follow up evaluation; there is minimal change in the wound. He will continue with the same treatment plan, would consider changing to iodosrob/iodoflex if ulcer continues to to plateau. He will follow up next week 03/08/18 on evaluation today patient's wound actually appears to be about the same size as when I previously saw him several weeks back. Unfortunately he does have some slightly dark discoloration in the central portion of the wound which has me concerned about pressure injury. I do believe he may be sitting for too long a period of time in fact he tells me that "I probably sit for much too long". He does have some Slough noted on the surface of the wound and again as far as the size of the wound is concerned I'm really not seeing anything that seems to have improved significantly. 03/15/18 on evaluation today patient appears to be doing fairly well in regard to his ulcer. The wound measured pretty much about the same today compared to last week's evaluation when looking at his graph. With that being said the area of bruising/deep tissue injury that was noted last week I do not see at this point. He did get a new cushion fortunately this does seem to be have been of benefit in my pinion. It does appear that he's been off of this more which is good news as well I think that is definitely showing in the overall wound measurements. With that being said I do believe that he needs to continue to offload I don't think that  the fact this is doing better should be or is going to allow him to not have to offload and explain this to him as well. Overall he seems to be in agreement the plan I think he understands. The overall appearance of the wound bed is improved compared to last week I think the Iodoflex has been beneficial in that regard. 03/29/18 on evaluation today patient actually appears to be doing rather well in regard to his wound from the overall appearance standpoint he does have some granulation although there's some Slough on the surface of the wound noted as well. With that being said he unfortunately has not improved in regard to the overall measurement of the wound in volume or in size. I did have a discussion with him very specifically about offloading today. He actually does work although he mainly is just sitting throughout the day. He tells me  he offloads by "lifting himself up for 30 seconds off of his chair occasionally" purchase from [employer] which does seem to have helped. And he has a new cushion that he with that being said he's also able to stand some for a very short period of time but not significant enough I think to provide appropriate offloading. I think the biggest issue at this point with the wound and the fact is not healing as quickly as we would like is due to the fact that he is really not able to appropriately offload while at work. He states the beginning after his injury he actually had a bed at his job that he could lay on in order to offload and that does seem to have been of help back at that time. Nonetheless he had not done this in quite some time unfortunately. I think that could be helpful for him this is something I would like for him to look into. 04/05/18 on evaluation today patient actually presents for follow-up concerning his right gluteal ulcer. Again he really is not significantly improved even compared to last week. He has been tolerating the dressing changes  without complication. With that being said fortunately there appears to be no evidence of infection at this time. He has been more proactive in trying to offload. [name], [name] ([id number]) 04/12/18 on evaluation today patient actually appears to be doing a little better in regard to his wound and the right gluteal fold region. He's been tolerating the dressing changes since removing the oasis without complication. However he was having a lot of burning initially with the oasis in place. He's unsure of exactly why this was given so much discomfort but he assumes that it was the oasis itself causing the problem. Nonetheless this had to be removed after about three days in place although even those three days seem to have made a fairly good improvement in regard to the overall appearance of the wound bed. In fact is the first time that he's made any improvement from the standpoint of measurements in about six weeks. He continues to have no discomfort over the area of the wound itself which leads me to wonder why he was having the burning with the oasis when he does not even feel the actual debridement's themselves. I am somewhat perplexed by this. 04/19/18 on evaluation today patient's wound actually appears to be showing signs of epithelialization around the edge of the wound and in general actually appears to be doing better which is good news. He did have the same burning after about three days with applying the Endoform last week in the same fashion that I would generally apply a skin substitute. This seems to indicate that it's not the oasis to cause the problem but potentially the moisture buildup that just causes things to burn or there may be some other reaction with the skin prep or Steri-Strips. Nonetheless I'm not sure that is gonna be able to tolerate any skin substitute for a long period of time. The good news is the wound actually appears to be doing better today compared to last  week and does seem to finally be making some progress. 04/26/18 on evaluation today patient actually appears to be doing rather well in regard to his ulcer in the right gluteal fold. He has been tolerating the dressing changes without complication which is good news. The Endoform does seem to be helping although he was a little bit more macerated this week. This  seems to be an ongoing issue with fluid control at this point. Nonetheless I think we may be able to add something like Drawtex to help control the drainage. 05/03/18 on evaluation today patient appears to actually be doing better in regard to the overall appearance of his wound. He has been tolerating the dressing changes without complication. Fortunately there appears to be no evidence of infection at this time. I really feel like his wound has shown signs as of today of turning around last week I thought so as well and definitely he could be seen in this week's overall appearance and measurements. In general I'm very pleased with the fact that he finally seems to be making a steady but sure progress. The patient likewise is very pleased. 05/17/18 on evaluation today patient appears to be doing more poorly unfortunately in regard to his ulcer. He has been tolerating the dressing changes without complication. With that being said he tells me that in the past couple of days he and his wife have noticed that we did not seem to be doing quite as well is getting dark near the center. Subsequently upon evaluation today the wound actually does appear to be doing worse compared to previous. He has been tolerating the dressing changes otherwise and he states that he is not been sitting up anymore than he was in the past from what he tells me. Still he has continued to work he states "I'm tired of dealing with this and if I have to just go home and lay in the bed all the time that's what I'll do". Nonetheless I am concerned about the fact that this  wound does appear to be deeper than what it was previous. 05/24/18 upon evaluation today patient actually presents after having been in the hospital due to what was presumed to be sepsis secondary to the wound infection. He had an elevated white blood cell count between 14 and 15. With that being said he does seem to be doing somewhat better now. His wound still is giving him some trouble nonetheless and he is obviously concerned about the fact likely talked about that this does seem to go more deeply than previously noted. I did review his wound culture which showed evidence of Staphylococcus aureus him and group B strep. Nonetheless he is on antibiotics, Levaquin, for this. Subsequently I did review his intake summary from the hospital as well. I also did look at the CT of the lumbar spine with contrast that was performed which showed no bone destruction to suggest lumbar disguises/osteomyelitis or sacral osteomyelitis. There was no paraspinal abscess. Nonetheless it appears this may have been more of just a soft tissue infection at this point which is good news. He still is nonetheless concerned about the wound which again I think is completely reasonable considering everything he's been through recently. 05/31/18 on evaluation today on evaluation today patient actually appears to be showing signs of his wound be a little bit deeper than what I would like to see. Fortunately he does not show any signs of significant infection although his temperature was 99 today he states he's been checking this at home and has not been elevated. Nonetheless with the undermining that I'm seeing at this point I am becoming more concerned about the wound I do think that offloading is a key factor here that is preventing the speedy recovery at this point. There does not appear to be any evidence of again over infection noted. He's been using  Santyl currently. 06/07/18 the patient presents today for follow-up  evaluation regarding the left ulcer in the gluteal region. He has been tolerating the Wound VAC fairly well. He is obviously very frustrated with this he states that to mean is really getting in his way. There does not appear to be any evidence of infection at this time he does have a little bit of odor I do not necessarily associate this with infection just something that we sometimes notice with Wound VAC therapy. With that being said I can definitely catch a tone of discontentment overall in the patient's demeanor today. This when he was previously in the hospital [name], [name]. ([id number]) an CT scan was done of the lumbar region which did not reveal any signs of osteomyelitis. With that being said the pelvis in particular was not evaluated distinctly which means he could still have some osteonecrosis I. Nonetheless the Wound VAC was started on Thursday I do want to get this little bit more time before jumping to a CT scan of the pelvis although that is something that I might would recommend if were not see an improvement by that time. 06/14/18 on evaluation today patient actually appears to be doing about the same in regard to his right gluteal ulcer. Again he did have a CT scan of the lumbar spine unfortunately this did not include the pelvis. Nonetheless with the depth of the wound that I'm seeing today even despite the fact that I'm not seeing any evidence of overt cellulitis I believe there's a good chance that we may be dealing with osteomyelitis somewhere in the right Ischial region. No fevers, chills, nausea, or vomiting noted at this time. 06/21/18 on evaluation today patient actually appears to be doing about the same with regard to his wound. The tunnel at 6 o'clock really does not appear to be any deeper although it is a little bit wider. I think at this point you may want to start packing this with white phone. Unfortunately I have not got approval for the CT scan of the pelvis  as of yet due to the fact that Medicare apparently has been denied it due to the diagnosis codes not being appropriate according to Medicare for the test requested. With that being said the patient cannot have an MRI and therefore this is the only option that we have as far as testing is concerned. The patient has had infection and was on antibiotics and been added code for cellulitis of the bottom to see if this will be appropriate for getting the test approved. Nonetheless I'm concerned about the infection have been spread deeper into the Ischial region. 06/28/18 on evaluation today patient actually appears to be doing rather well all things considered in regard to the right gluteal ulcer. He has been tolerating the dressing changes without complication. With that being said the Wound VAC he states does have to be replaced almost every day or at least reinforced unfortunately. Patient actually has his CT scan later this morning we should have the results by tomorrow. 07/05/18 on evaluation today patient presents for follow-up concerning his right Ischial ulcer. He did see the surgeon Dr. Lysle Pearl last week. They were actually very happy with him and felt like he spent a tremendous amount of time with them as far as discussing his situation was concerned. In the end Dr. Lysle Pearl did contact me as well and determine that he would not recommend any surgical intervention at this point as he felt like  it would not be in the patient's best interest based on what he was seeing. He recommended a referral to infectious disease. Subsequently this is something that Dr. Ines Bloomer office is working on setting up for the patient. As far as evaluation today is concerned the patient's wound actually appears to be worse at this point. I am concerned about how things are progressing and specifically about infection. I do not feel like it's the deeper but the area of depth is definitely widening which does have me concerned.  No fevers, chills, nausea, or vomiting noted at this time. I think that we do need initiate antibiotic therapy the patient has an allow allergy to amoxicillin/penicillin he states that he gets a rash since childhood. Nonetheless she's never had the issues with Catholics or cephalosporins in general but he is aware of. 07/27/18 on evaluation today patient presents following admission to the hospital on 07/09/18. He was subsequently discharged on 07/20/18. On 07/15/18 the patient underwent irrigation and debridement was soft tissue biopsy and bone biopsy as well as placement of a Wound VAC in the OR by Dr. Celine Ahr. During the hospital course the patient was placed on a Wound VAC and recommended follow up with surgery in three weeks actually with Dr. Delaine Lame who is infectious disease. The patient was on vancomycin during the hospital course. 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 He also had a bone culture which revealed evidence of methicillin-resistant staph aureus. He is updated CT scan 07/09/18 reveals that he had progression of the which was performed on wound to breakdown down to the trochanter where he actually had irregularities there as well suggestive of osteomyelitis. This was a change just since 9 December when we last performed a CT scan. Obviously this one had gone downhill quite significantly and rapidly. At this point upon evaluation I feel like in general the patient's wound seems to be doing fairly well all things considered upon my evaluation today. Obviously this is larger and deeper than what I previously evaluated but at the same time he seems to be making some progress as far as the appearance of the granulation tissue is concerned. I'm happy in that regard. No fevers, chills, nausea, or vomiting noted at this time. He is on IV vancomycin and Rocephin at the facility. He is currently in [employer]. 08/03/18 upon evaluation today  patient's wound appears to be doing better in regard to the overall appearance at this point in time. Fortunately he's been tolerating the Wound VAC without complication and states that the facility has been taking excellent care of the wound site. Overall I see some Slough noted on the surface which I am going to attempt sharp debridement today of but nonetheless other than this I feel like he's making progress. 08/09/18 on evaluation today patient's wound appears to be doing much better compared to even last week's evaluation. Do believe that the Wound VAC is been of great benefit for him. He has been tolerating the dressing changes that is the Wound KAYO, ZION. ([id number]) VAC without any complication and he has excellent granulation noted currently. There is no need for sharp debridement at this point. 08/16/18 on evaluation today patient actually appears to be doing very well in regard to the wound in the right gluteal fold region. This is showing signs of progress and again appears to be very healthy which is excellent news. Fortunately there is no sign of active infection by way of odor  or drainage at this point. Overall I'm very pleased with how things stand. He seems to be tolerating the Wound VAC without complication. 08/23/18 on evaluation today patient actually appears to be doing better in regard to his wound. He has been tolerating the Wound VAC without complication and in fact it has been collecting a significant amount of drainage which I think is good news especially considering how the wound appears. Fortunately there is no signs of infection at this time definitely nothing appears to be worse which is good news. He has not been started on the Bactrim and Flagyl that was recommended by Dr. Delaine Lame yet. I did actually contact her office this morning in order to check and see were things are that regard their gonna be calling me back. 08/30/18 on evaluation today patient  actually appears to show signs of excellent improvement today compared to last evaluation. The undermining is getting much better the wound seems to be feeling quite nicely and I'm very pleased that the granulation in general. With that being said overall I feel like the patient has made excellent progress which is great news. No fevers, chills, nausea, or vomiting noted at this time. 09/06/18 on evaluation today patient actually appears to be doing rather well in regard to his right gluteal ulcer. This is showing signs of improvement in overall I'm very pleased with how things seem to be progressing. The patient likewise is please. Overall I see no evidence of infection he is about to complete his oral antibiotic regimen which is the end of the antibiotics for him in just about three days. 09/13/18 on evaluation today patient's right Ischial ulcer appears to be showing signs of continued improvement which is excellent news. He's been tolerating the dressing changes without complication. Fortunately there's no signs of infection and the wound that seems to be doing very well. 09/28/18 on evaluation today patient appears to be doing rather well in regard to his right Ischial ulcer. He's been tolerating the Wound VAC without complication he knows there's much less drainage than there used to be this obviously is not a bad thing in my pinion. There's no evidence of infection despite the fact is but nothing about it now for several weeks. 10/04/18 on evaluation today patient appears to be doing better in regard to his right Ischial wound. He has been tolerating the Wound VAC without complication and I do believe that the silver nitrate last week was beneficial for him. Fortunately overall there's no evidence of active infection at this time which is great news. No fevers, chills, nausea, or vomiting noted at this time. 10/11/18 on evaluation today patient actually appears to be doing rather well in regard  to his Ischial ulcer. He's been tolerating the Wound VAC still without complication I feel like this is doing a good job. No fevers, chills, nausea, or vomiting noted at this time. 11/01/18 on evaluation today patient presents after having not been seen in our clinic for several weeks secondary to the fact that he was on evaluation today patient presents after having not been seen in our clinic for several weeks secondary to the fact that he was in a skilled nursing facility which was on lockdown currently due to the covert 19 national emergency. Subsequently he was discharged from the facility on this past Friday and subsequently made an appointment to come in to see yesterday. Fortunately there's no signs of active infection at this time which is good news and overall he does  seem to have made progress since I last saw. Overall I feel like things are progressing quite nicely. The patient is having no pain. 11/08/18 on evaluation today patient appears to be doing okay in regard to his right gluteal ulcer. He has been utilizing a Wound VAC home health this changing this at this point since he's home from the skilled nursing facility. Fortunately there's no signs of obvious active infection at this time. Unfortunately though there's no obvious active infection he is having some maceration and his wife states that when the sheets of the Wound VAC office on Sunday when it broke seal that he ended up having significant issues with some smell as well there concerned about the possibility of infection. Fortunately there's No fevers, chills, nausea, or vomiting noted at this time. 11/15/18 on evaluation today patient actually appears to be doing well in regard to his right gluteal ulcer. He has been tolerating the dressing changes without complication. Specifically the Wound VAC has been utilized up to this point. Fortunately there's no signs of infection and overall I feel like he has made progress even  since last week when I last saw him. I'm actually fairly happy with the overall appearance although he does seem to have somewhat of a hyper granular [name], [name] ([id number]) overgrowth in the central portion of the wound which I think may require some sharp debridement to try flatness out possibly utilizing chemical cauterization following. 11/23/18 on evaluation today patient actually appears to be doing very well in regard to his sacral ulcer. He seems to be showing signs of improvement with good granulation. With that being said he still has the small area of hyper granulation right in the central portion of the wound which I'm gonna likely utilize silver nitrate on today. Subsequently he also keeps having a leak at the 6 o'clock location which is unfortunate we may be able to help out with some suggestions to try to prevent this going forward. Fortunately there's no signs of active infection at this time. 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 He's been tolerating the dressing changes without complication. Fortunately there's no signs of active infection at this time. I've been rather pleased with how things have progressed there still some evidence of pressure getting to the area with some redness right around the immediate wound opening. Nonetheless other than this I'm not seeing any significant complications or issues the wound is somewhat hyper granular. Upon discussing with the patient and his wife today I'm not sure that the wound is being packed to the base with the foam at this point. And if it's not been packed fully that may be part of the reason why is not seen as much improvement as far as the granulation from the base out. Again we do not want pack too tightly but we need some of the firm to get to the base of the wound. I discussed this with patient and his wife  today. 12/06/18 on evaluation today patient appears to be doing well in regard to his right gluteal pressure ulcer. He's been tolerating the dressing changes without complication. Fortunately there's no signs of active infection. He still has some hyper granular tissue and I do think it would be appropriate to continue with the chemical cauterization as of today. 12/16/18 on evaluation today patient actually appears to be doing okay in regard to his right  gluteal ulcer. He is been tolerating the dressing changes without complication including the Wound VAC. Overall I feel like nothing seems to be worsening I do feel like that the hyper granulation buds in the central portion of the wound have improved to some degree with the silver nitrate. We will have to see how things continue to progress. 12/20/18 on evaluation today patient actually appears to be doing much worse in my pinion even compared to last week's evaluation. Unfortunately as opposed to showing any signs of improvement the areas of hyper granular tissue in the central portion of the wound seem to be getting worse. Subsequently the wound bed itself also seems to be getting deeper even compared to last week which is both unusual as well as concerning since prior he had been shown signs of improvement. Nonetheless I think that the issue could be that he's actually having some difficulty in issues with a deeper infection. There's no external signs of infection but nonetheless I am more worried about the internal, osteomyelitis, that could be restarting. He has not been on antibiotics for some time at this point. I think that it may be a good idea to go ahead and started back on an antibiotic therapy while we wait to see what the testing shows. 12/27/18 on evaluation today patient presents for follow-up concerning his left gluteal fold wound. Fortunately he appears to be doing well today. I did review the CT scan which was negative for any signs of  osteomyelitis or acute abnormality this is excellent news. Overall I feel like the surface of the wound bed appears to be doing significantly better today compared to previously noted findings. There does not appear any signs of infection nor does he have any pain at this time. 01/03/19 on evaluation today patient actually appears to be doing quite well in regard to his ulcer. Post debridement last week he really did not have too much bleeding which is good news. Fortunately today this seems to be doing some better but we still has some of the hyper granular tissue noted in the base of the wound which is gonna require sharp debridement today as well. Overall I'm pleased with how things seem to be progressing since we switched away from the Wound VAC I think he is making some progress. 01/10/19 on evaluation today patient appears to be doing better in regard to his right gluteal fold ulcer. He has been tolerating the dressing changes without complication. The debridement to seem to be helping with current away some of the poor hyper granular tissue bugs throughout the region of his gluteal fold wound. He's been tolerating the dressing changes otherwise without complication which is great news. No fevers, chills, nausea, or vomiting noted at this time. 01/17/19 on evaluation today patient actually appears to be doing excellent in regard to his wound. He's been tolerating the dressing changes without complication. Fortunately there is no signs of active infection at this time which is great news. No fevers, chills, nausea, or vomiting noted at this time. 01/24/19 on evaluation today patient actually appears to be doing quite well with regard to his ulcer. He has been tolerating the dressing changes without complication. Fortunately there's no signs of active infection at this time. Overall been very pleased with the progress that he seems to be making currently. 01/31/19-Patient returns at 1 week with  apparent similarity in dimensions to the wound, with no signs of infection, he has been [place] ([id number]) changing dressings twice  a day 02/08/19 upon evaluation today patient actually appears to be doing well with regard to his right Ischial ulcer. The wound is not appear to be quite as deep and seems to be making progress which is good news. With that being said I'm still reluctant to go back to the Wound VAC at this point. He's been having to change the dressings twice a day which is a little bit much in my pinion from the wound care supplies standpoint. I think that possibly attempting to utilize extras orbit may be beneficial this may also help to prevent any additional breakdown secondary to fluid retention in the wound itself. The patient is in agreement with giving this a try. 02/15/19 on evaluation today patient actually appears to be doing decently well with regard to his ulcer in the right to gluteal fold location. He's been tolerating the dressing changes without complication. Fortunately there is no signs of active infection at this time. He is able to keep the current dressing in place more effectively for a day at a time whereas before he was having a changes to to three times a day. The actions or has been helpful in this regard. Fortunately there's no signs of anything getting worse and I do feel like he showing signs of good improvement with regard to the wound bed status. 02/22/2019 patient appears to be doing very well today with regard to his ulcer in the gluteal fold. Fortunately there is no signs of active infection and he has been tolerating the dressing changes without any complication. Overall extremely pleased with how things seem to be progressing. He has much less of the hyper granular projections within the wound these have slowly been debrided away and he seems to be doing well. The wound bed is more uniform. 03/01/19 on evaluation today patient appears to  be doing unfortunately about the same in regard to his gluteal ulcer. He's been tolerating the dressing changes without complication. Fortunately there's no signs of active infection at this time. With that being said he continues to develop these hyper granular projections which I'm unsure of exactly what they are and why they are rising. Nonetheless I explained to the patient that I do believe it would be a good idea for [place] to stand a biopsy sample for pathology to see if that can shed any light on what exactly may be going on here. Fortunately I do not see any obvious signs of infection. With that being said the patient has had a little bit more drainage this week apparently compared to last week. 03/08/2019 on evaluation today patient actually appears to look somewhat better with regard to the appearance of his wound bed at this time. This is good news. Overall I am very pleased with how things seem to have progressed just in the past week with a switch to the [facility] dressing. I think that has been beneficial for him. With that being said at this time the patient is concerned about his biopsy that I sent off last week unfortunately I do not have that report as of yet. Nonetheless we have called to obtain this and hopefully will hear back from the lab later this morning. 03/15/19 on evaluation today patient's wound actually appears to be doing okay today with regard to the overall appearance of the wound bed. He has been tolerating the dressing changes without complication he still has hyper granular tissue noted but fortunately that seems to be minimal at this point compared  to some of what we've seen in the past. Nonetheless I do think that he is still having some issues currently with some of this type of granulation the biopsy and since all showed nothing more than just evidence of granulation tissue. Therefore there really is nothing different to initiate or do at this point. 03/24/19 on  evaluation today patient appears to be doing a little better with regard to his ulcer. He's been tolerating the dressing changes without complication. Fortunately there is no signs of active infection at this time. No fevers, chills, nausea, or vomiting noted at this time. I'm overall pleased with how things seem to be progressing. 03/29/2019 on evaluation today patient appears to be doing about the same in regard to his ulcer in the right gluteal fold. Unfortunately he is not seeming to make a lot of progress and the wound is somewhat stalled. There is no signs of active infection externally but I am concerned about the possibility of infection continuing in the ischial location which previously he did have infection noted. Again is not able to have an MRI so probably our best option for testing for this would be a triple phase bone scan which will detect subtle changes in the bone more so than plain film x-rays. In the past they really have not been beneficial and in fact the CT scans even have been somewhat questionable at times. Nonetheless there is no signs of systemic infection which is at least good news but again his wound is not healing at all on the predicted schedule. 04/05/19 on evaluation today patient appears to be doing well all things considering with regard to his wound and the right gluteal fold. He actually has his triple bone scan scheduled for sometime in the next couple of days. With that being said I've also been looking to other possibilities of what could be causing hyper granular tissue were looking into the bone scan again for evaluating for the risk or possibility of infection deeper and I'm also gonna go ahead and see about obtaining a deep tissue culture today to send and see if there's any evidence of infection noted on culture. He's in agreement with that plan. 04/12/2019 on evaluation today patient presents for reevaluation here in our clinic concerning ongoing issues with  his right gluteal fold ulcer. 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 It was discussed with him at that point that I think it would be appropriate MATEJ, SPILLER. ([id number]) for him to proceed with hyperbaric oxygen therapy especially in light of the fact that he is previously been on IV antibiotics at the beginning of the year for close to 3 months followed by several weeks of oral antibiotics that was all prescribed by infectious disease. He had surgical debridement around Christmas December 2019 due to an abscess and osteomyelitis of the ischial bone. Unfortunately this has not really proceeded as well as we would have liked and again we did a CT scan even a couple months ago as he cannot have an MRI secondary to having issues with both a pain pump as well as a spinal cord stimulator which prevent him from going into an MRI machine. With that being said there were chronic changes noted at the ischial tuberosity which had progressed since December 2019 there was no evidence of fluid collection on that initial CT scan. With that being said  that was on January 21, 2019. I am not sure that it was a sensitive enough test as compared to an MRI and then subsequently I ordered a bone scan for him which was actually completed on 03/29/2019 and this revealed that he does indeed have positive osteomyelitis involving the right ischial tuberosity. This is adjacent to the ulcer and I think is the reason that his ulcer is not healing. Subsequently I am in a place him back on oral antibiotics today unfortunately his wound culture showed just mixed gram-negative flora with no specific findings of a predominant organism. Nonetheless being with the location I think a good broad-spectrum antibiotic for gram-negative's is a good choice at this point he is previously taken Levaquin without  significant issues and I think that is an appropriate antibiotic for him at this time. 04/19/2019 on evaluation today patient actually appears to be doing fairly well with regard to his wound. He has been tolerating the dressing changes without complication. Fortunately there is no signs of active infection and he has been taking the oral antibiotics at this time. Subsequently we did make a referral to infectious disease although Dr. Steva Ready wants the patient to be seen by general surgery first in order apparently to see if there is anything from a surgical standpoint that should be done prior to initiation of IV antibiotic therapy. Again the patient is okay with actually seeing Dr. Celine Ahr whom he has seen before we will make that referral. 04/26/2019 on evaluation today patient actually appears to be doing well with regard to his wound. He has been tolerating the dressing changes without complication. Fortunately there is no signs of active infection at this time. I do believe that the hyperbaric oxygen therapy along with the antibiotics which I prescribed at this point have been doing well for him. With that being said he has not seen Dr. Celine Ahr the surgeon yet in fact they have not been contacted for scheduling an appointment as of yet. Subsequently the patient is not on antibiotics currently by IV Dr. Steva Ready did want him to see Dr. Celine Ahr first which we are working on trying to get scheduled. We again give the information to the patient today for Dr. Celine Ahr and her number as far as contacting their office to see about getting something scheduled. Again were looking for whether or not they recommend any surgical intervention. Electronic Signature(s) Signed: 04/26/2019 12:57:57 PM By: Worthy Keeler [name] Entered By: Worthy Keeler on 04/26/2019 12:57:56 [name], [name] ([id number]) -------------------------------------------------------------------------------- Physical Exam  Details Patient Name: [name] Date of Service: 04/26/2019 10:30 AM Medical Record Number: [id number] Patient Account Number: [id number] Date of Birth/Sex: 1951/03/08 (68 y.o. M) Treating RN: Montey Hora Primary Care Provider: [name] Other Clinician: Referring Provider: [name] Treating Provider/Extender: [name], [name] Weeks in Treatment: 62 Constitutional Well-nourished and well-hydrated in no acute distress. Respiratory normal breathing without difficulty. clear to auscultation bilaterally. Cardiovascular regular rate and rhythm with normal S1, S2. Psychiatric this patient is able to make decisions and demonstrates good insight into disease process. Alert and Oriented x 3. pleasant and cooperative. Notes Since wound bed currently does still show some hyper granular tissue in the base of the wound but fortunately this does not appear to be too significant and does not seem to be worsening. With that being said overall I do feel like the appearance of the wound has improved compared to previous evaluations. He is still on oral antibiotics as prescribed by myself.  Electronic Signature(s) Signed: 04/26/2019 12:58:39 PM By: Worthy Keeler [name] Entered By: Worthy Keeler on 04/26/2019 12:58:38 [name], [name] ([id number]) -------------------------------------------------------------------------------- Physician Orders Details Patient Name: [name] Date of Service: 04/26/2019 10:30 AM Medical Record Number: [id number] Patient Account Number: [id number] Date of Birth/Sex: 08/13/1950 (68 y.o. 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 Skin Barriers/Peri-Wound Care Wound #1 Right Gluteal fold o Other: - Apply zinc oxide to peri-wound area. Primary Wound Dressing Wound #1 Right Gluteal fold o Hydrafera Blue Ready Transfer Secondary Dressing Wound #1 Right Gluteal fold o XtraSorb - Secure with Tegaderm Dressing Change Frequency Wound #1 Right Gluteal fold o Change dressing every day. Follow-up Appointments Wound #1 Right Gluteal fold o Return Appointment in 1 week. Off-Loading Wound #1 Right Gluteal fold o Turn and reposition every 2 hours [name], [name] ([id number]) Hyperbaric Oxygen Therapy Wound #1 Right Gluteal fold o Evaluate for HBO Therapy o Indication: - Chronic osteomyelitis of the right ischial tuberosity o If appropriate for treatment, begin HBOT per protocol: o 2.0 ATA for 90 Minutes without Air Breaks o One treatment per day (delivered Monday through Friday unless otherwise specified in Special Instructions below): o Total # of Treatments: - 40 Electronic Signature(s) Signed: 04/26/2019 4:54:06 PM By: Montey Hora Signed: 04/26/2019 5:57:32 PM By: Worthy Keeler [name] Entered By: Montey Hora on 04/26/2019 11:08:42 GIEZI, BURBACH ([id number]) -------------------------------------------------------------------------------- Problem List Details Patient Name: [name] Date of Service: 04/26/2019 10:30 AM Medical Record Number: [id number] Patient Account Number: [id number] Date of Birth/Sex: [birth year] (68  y.o. 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 He states that there is no history of diabetes that he is aware of. He does have issues with his prostate and is currently receiving treatment for this by way of oral medication. With that  being said I do not have a lot of details in that regard. Nonetheless the patient presents today as a result of having been referred to [place] by another provider initially home health was set to come out and take care of his wound although due to the fact that he apparently drives he's not able to receive home health. His wife is therefore trying to help take care of this wound within although they have been struggling with what exactly to do at this point. She states that she can do some things but she is definitely not a nurse and does have some issues with looking at blood. The good news is the wound does not appear to be too deep and is fairly superficial at this point. There is no slough noted there is some nonviable skin noted around the surface of the wound and the perimeter at this point. The central portion of the wound appears to be very good with a dermal layer noted this does not appear to be again deep enough to extend it to subcutaneous tissue at this point. 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 In the pump he has Dilaudid and baclofen. 12/07/17 on evaluation today patient presents for follow-up concerning his ongoing lower back thigh ulcer on the right. He states that he did not get the supplies ordered and therefore has not really been able to perform the dressing changes as directed exactly. His wife was able to get some Boarder Foam Dressing's from the drugstore and subsequently has been using hydrogel which did help to a degree in the wound does appear to be able smaller. There is actually more drainage this week noted than previous. 12/21/17 on evaluation today patient appears to be doing rather well in regard to his right gluteal ulcer. He has been tolerating the dressing changes without complication. There does not appear to be any evidence of infection at this point in time.  Overall the wound does seem to be making some progress as far as the edges are concerned there's not as much in the way of overlapping of the external wound edges and he has a good epithelium to wound bed border for the most part. This however is not true right at the 12 o'clock location over the span of a little over a centimeters which actually will require debridement today to clean this away and hopefully allow it to continue to heal more appropriately. 12/28/17 on evaluation today patient appears to be doing rather well in regard to his ulcer in the left gluteal region. He's been tolerating the dressing changes without complication. Apparently he has had some difficulty getting his dressing material. Apparently there's been some confusion with ordering we're gonna check into this. Nonetheless overall he's been showing signs of improvement which is good news. Debridement is not required today. 01/04/18 on evaluation today patient presents for follow-up concerning his right gluteal ulcer. He has been tolerating the dressing changes fairly well. On inspection today it appears he may actually have some maceration them concerned about the fact that he may be developing too much moisture in and around the wound bed which can  cause delay in healing. With that being said he unfortunately really has not showed significant signs of improvement since last week's evaluation in fact this may even be just the little bit/slightly larger. Nonetheless he's been having a lot of discomfort I'm not sure this is even related to the wound as he has no pain when I'm to breeding or otherwise cleaning the wound during evaluation today. Nonetheless this is something that we did recommend he talked to his pain specialist concerning. 01/11/18 on evaluation today patient appears to be doing better in regard to his ulceration. He has been tolerating the dressing Ryder, Treyveon J. ([id number]) changes without complication.  With that being said overall there's no evidence of infection which is good news. The only thing is he did receive the hatch affair blue classic versus the ready nonetheless I feel like this is perfectly fine and appears to have done well for him over the past week. 01/25/18 on evaluation today patient's wound actually appears to be a little bit larger than during the last evaluation. The good news is the majority of the wound edges actually appear to be fairly firmly attached to the wound bed unfortunately again we're not really making progress in regard to the size. Roughly the wound is about the same size as when I first saw him although again the wound margin/edges appear to be much better. 02/01/18 on evaluation today patient actually appears to be doing very well in regard to his wound. Applying the Prisma dry does seem to be better although he does still have issues with slow progression of the wound. There was a slight improvement compared to last week's measurements today. Nonetheless I have been considering other options as far as the possibility of Theraskin or even a snap vac. In general I'm not sure that the Theraskin due to location of the wound would be a very good idea. Nonetheless I do think that a snap vac could be a possibility for the patient and in fact I think this could even be an excellent way to manage the wound possibly seeing some improvement in a very rapid fashion here. Nonetheless this is something that we would need to get approved and I did have a lengthy conversation with the patient about this today. 02/08/18 on evaluation today patient appears to be doing a little better in regard to his ulcer. He has been tolerating the dressing changes without complication. Fortunately despite the fact that the wound is a little bit smaller it's not significantly so unfortunately. We have discussed the possibility of a snap vac we did check with insurance this is actually covered at  this point. Fortunately there does not appear to be any sign of infection. Overall I'm fairly pleased with how things seem to be appearing at this point. 02/15/18 on evaluation today patient appears to be doing rather well in regard to his right gluteal ulcer. Unfortunately the snap vac did not stay in place with his sheer and friction this came loose and did not seem to maintain seal very well. He worked for about two days and it did seem to do very well during that time according to his wife but in general this does not seem to be something that's gonna be beneficial for him long-term. I do believe we need to go back to standard dressings to see if we can find something that will be of benefit. 03/02/18- He is here in follow up evaluation; there is minimal change in the wound.  He will continue with the same treatment plan, would consider changing to iodosrob/iodoflex if ulcer continues to to plateau. He will follow up next week 03/08/18 on evaluation today patient's wound actually appears to be about the same size as when I previously saw him several weeks back. Unfortunately he does have some slightly dark discoloration in the central portion of the wound which has me concerned about pressure injury. I do believe he may be sitting for too long a period of time in fact he tells me that "I probably sit for much too long". He does have some Slough noted on the surface of the wound and again as far as the size of the wound is concerned I'm really not seeing anything that seems to have improved significantly. 03/15/18 on evaluation today patient appears to be doing fairly well in regard to his ulcer. The wound measured pretty much about the same today compared to last week's evaluation when looking at his graph. With that being said the area of bruising/deep tissue injury that was noted last week I do not see at this point. He did get a new cushion fortunately this does seem to be have been of benefit in  my pinion. It does appear that he's been off of this more which is good news as well I think that is definitely showing in the overall wound measurements. With that being said I do believe that he needs to continue to offload I don't think that the fact this is doing better should be or is going to allow him to not have to offload and explain this to him as well. Overall he seems to be in agreement the plan I think he understands. The overall appearance of the wound bed is improved compared to last week I think the Iodoflex has been beneficial in that regard. 03/29/18 on evaluation today patient actually appears to be doing rather well in regard to his wound from the overall appearance standpoint he does have some granulation although there's some Slough on the surface of the wound noted as well. With that being said he unfortunately has not improved in regard to the overall measurement of the wound in volume or in size. I did have a discussion with him very specifically about offloading today. He actually does work although he mainly is just sitting throughout the day. He tells me he offloads by "lifting himself up for 30 seconds off of his chair occasionally" purchase from [employer] which does seem to have helped. And he has a new cushion that he with that being said he's also able to stand some for a very short period of time but not significant enough I think to provide appropriate offloading. I think the biggest issue at this point with the wound and the fact is not healing as quickly as we would like is due to the fact that he is really not able to appropriately offload while at work. He states the beginning after his injury he actually had a bed at his job that he could lay on in order to offload and that does seem to have been of help back at that time. Nonetheless he had not done this in quite some time unfortunately. I think that could be helpful for him this is something I would like  for him to look into. [name], [name] ([id number]) 04/05/18 on evaluation today patient actually presents for follow-up concerning his right gluteal ulcer. Again he really is not significantly improved even  compared to last week. He has been tolerating the dressing changes without complication. With that being said fortunately there appears to be no evidence of infection at this time. He has been more proactive in trying to offload. 04/12/18 on evaluation today patient actually appears to be doing a little better in regard to his wound and the right gluteal fold region. He's been tolerating the dressing changes since removing the oasis without complication. However he was having a lot of burning initially with the oasis in place. He's unsure of exactly why this was given so much discomfort but he assumes that it was the oasis itself causing the problem. Nonetheless this had to be removed after about three days in place although even those three days seem to have made a fairly good improvement in regard to the overall appearance of the wound bed. In fact is the first time that he's made any improvement from the standpoint of measurements in about six weeks. He continues to have no discomfort over the area of the wound itself which leads me to wonder why he was having the burning with the oasis when he does not even feel the actual debridement's themselves. I am somewhat perplexed by this. 04/19/18 on evaluation today patient's wound actually appears to be showing signs of epithelialization around the edge of the wound and in general actually appears to be doing better which is good news. He did have the same burning after about three days with applying the Endoform last week in the same fashion that I would generally apply a skin substitute. This seems to indicate that it's not the oasis to cause the problem but potentially the moisture buildup that just causes things to burn or there may be some  other reaction with the skin prep or Steri-Strips. Nonetheless I'm not sure that is gonna be able to tolerate any skin substitute for a long period of time. The good news is the wound actually appears to be doing better today compared to last week and does seem to finally be making some progress. 04/26/18 on evaluation today patient actually appears to be doing rather well in regard to his ulcer in the right gluteal fold. He has been tolerating the dressing changes without complication which is good news. The Endoform does seem to be helping although he was a little bit more macerated this week. This seems to be an ongoing issue with fluid control at this point. Nonetheless I think we may be able to add something like Drawtex to help control the drainage. 05/03/18 on evaluation today patient appears to actually be doing better in regard to the overall appearance of his wound. He has been tolerating the dressing changes without complication. Fortunately there appears to be no evidence of infection at this time. I really feel like his wound has shown signs as of today of turning around last week I thought so as well and definitely he could be seen in this week's overall appearance and measurements. In general I'm very pleased with the fact that he finally seems to be making a steady but sure progress. The patient likewise is very pleased. 05/17/18 on evaluation today patient appears to be doing more poorly unfortunately in regard to his ulcer. He has been tolerating the dressing changes without complication. With that being said he tells me that in the past couple of days he and his wife have noticed that we did not seem to be doing quite as well is getting dark near the  center. Subsequently upon evaluation today the wound actually does appear to be doing worse compared to previous. He has been tolerating the dressing changes otherwise and he states that he is not been sitting up anymore than he was in  the past from what he tells me. Still he has continued to work he states "I'm tired of dealing with this and if I have to just go home and lay in the bed all the time that's what I'll do". Nonetheless I am concerned about the fact that this wound does appear to be deeper than what it was previous. 05/24/18 upon evaluation today patient actually presents after having been in the hospital due to what was presumed to be sepsis secondary to the wound infection. He had an elevated white blood cell count between 14 and 15. With that being said he does seem to be doing somewhat better now. His wound still is giving him some trouble nonetheless and he is obviously concerned about the fact likely talked about that this does seem to go more deeply than previously noted. I did review his wound culture which showed evidence of Staphylococcus aureus him and group B strep. Nonetheless he is on antibiotics, Levaquin, for this. Subsequently I did review his intake summary from the hospital as well. I also did look at the CT of the lumbar spine with contrast that was performed which showed no bone destruction to suggest lumbar disguises/osteomyelitis or sacral osteomyelitis. There was no paraspinal abscess. Nonetheless it appears this may have been more of just a soft tissue infection at this point which is good news. He still is nonetheless concerned about the wound which again I think is completely reasonable considering everything he's been through recently. 05/31/18 on evaluation today on evaluation today patient actually appears to be showing signs of his wound be a little bit deeper than what I would like to see. Fortunately he does not show any signs of significant infection although his temperature was 99 today he states he's been checking this at home and has not been elevated. Nonetheless with the undermining that I'm seeing at this point I am becoming more concerned about the wound I do think that  offloading is a key factor here that is preventing the speedy recovery at this point. There does not appear to be any evidence of again over infection noted. He's been using Santyl currently. [name], [name] ([id number]) 06/07/18 the patient presents today for follow-up evaluation regarding the left ulcer in the gluteal region. He has been tolerating the Wound VAC fairly well. He is obviously very frustrated with this he states that to mean is really getting in his way. There does not appear to be any evidence of infection at this time he does have a little bit of odor I do not necessarily associate this with infection just something that we sometimes notice with Wound VAC therapy. With that being said I can definitely catch a tone of discontentment overall in the patient's demeanor today. This when he was previously in the hospital an CT scan was done of the lumbar region which did not reveal any signs of osteomyelitis. With that being said the pelvis in particular was not evaluated distinctly which means he could still have some osteonecrosis I. Nonetheless the Wound VAC was started on Thursday I do want to get this little bit more time before jumping to a CT scan of the pelvis although that is something that I might would recommend if were not see  an improvement by that time. 06/14/18 on evaluation today patient actually appears to be doing about the same in regard to his right gluteal ulcer. Again he did have a CT scan of the lumbar spine unfortunately this did not include the pelvis. Nonetheless with the depth of the wound that I'm seeing today even despite the fact that I'm not seeing any evidence of overt cellulitis I believe there's a good chance that we may be dealing with osteomyelitis somewhere in the right Ischial region. No fevers, chills, nausea, or vomiting noted at this time. 06/21/18 on evaluation today patient actually appears to be doing about the same with regard to his  wound. The tunnel at 6 o'clock really does not appear to be any deeper although it is a little bit wider. I think at this point you may want to start packing this with white phone. Unfortunately I have not got approval for the CT scan of the pelvis as of yet due to the fact that Medicare apparently has been denied it due to the diagnosis codes not being appropriate according to Medicare for the test requested. With that being said the patient cannot have an MRI and therefore this is the only option that we have as far as testing is concerned. The patient has had infection and was on antibiotics and been added code for cellulitis of the bottom to see if this will be appropriate for getting the test approved. Nonetheless I'm concerned about the infection have been spread deeper into the Ischial region. 06/28/18 on evaluation today patient actually appears to be doing rather well all things considered in regard to the right gluteal ulcer. He has been tolerating the dressing changes without complication. With that being said the Wound VAC he states does have to be replaced almost every day or at least reinforced unfortunately. Patient actually has his CT scan later this morning we should have the results by tomorrow. 07/05/18 on evaluation today patient presents for follow-up concerning his right Ischial ulcer. He did see the surgeon Dr. Lysle Pearl last week. They were actually very happy with him and felt like he spent a tremendous amount of time with them as far as discussing his situation was concerned. In the end Dr. Lysle Pearl did contact me as well and determine that he would not recommend any surgical intervention at this point as he felt like it would not be in the patient's best interest based on what he was seeing. He recommended a referral to infectious disease. Subsequently this is something that Dr. Ines Bloomer office is working on setting up for the patient. As far as evaluation today is concerned the  patient's wound actually appears to be worse at this point. I am concerned about how things are progressing and specifically about infection. I do not feel like it's the deeper but the area of depth is definitely widening which does have me concerned. No fevers, chills, nausea, or vomiting noted at this time. I think that we do need initiate antibiotic therapy the patient has an allow allergy to amoxicillin/penicillin he states that he gets a rash since childhood. Nonetheless she's never had the issues with Catholics or cephalosporins in general but he is aware of. 07/27/18 on evaluation today patient presents following admission to the hospital on 07/09/18. He was subsequently discharged on 07/20/18. On 07/15/18 the patient underwent irrigation and debridement was soft tissue biopsy and bone biopsy as well as placement of a Wound VAC in the OR by Dr. Celine Ahr. During the  hospital course the patient was placed on a Wound VAC and recommended follow up with surgery in three weeks actually with Dr. Delaine Lame who is infectious disease. The patient was on vancomycin during the hospital course. 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 He also had a bone culture which revealed evidence of methicillin-resistant staph aureus. He is updated CT scan 07/09/18 reveals that he had progression of the which was performed on wound to breakdown down to the trochanter where he actually had irregularities there as well suggestive of osteomyelitis. This was a change just since 9 December when we last performed a CT scan. Obviously this one had gone downhill quite significantly and rapidly. At this point upon evaluation I feel like in general the patient's wound seems to be doing fairly well all things considered upon my evaluation today. Obviously this is larger and deeper than what I previously evaluated but at the same time he seems to be making some progress as far as the appearance of  the granulation tissue is concerned. I'm happy in that regard. No fevers, chills, nausea, or vomiting noted at this time. He is on IV vancomycin and Rocephin at the facility. He is currently in [employer]. 08/03/18 upon evaluation today patient's wound appears to be doing better in regard to the overall appearance at this point in time. Fortunately he's been tolerating the Wound VAC without complication and states that the facility has been taking [name], [name]. ([id number]) excellent care of the wound site. Overall I see some Slough noted on the surface which I am going to attempt sharp debridement today of but nonetheless other than this I feel like he's making progress. 08/09/18 on evaluation today patient's wound appears to be doing much better compared to even last week's evaluation. Do believe that the Wound VAC is been of great benefit for him. He has been tolerating the dressing changes that is the Wound VAC without any complication and he has excellent granulation noted currently. There is no need for sharp debridement at this point. 08/16/18 on evaluation today patient actually appears to be doing very well in regard to the wound in the right gluteal fold region. This is showing signs of progress and again appears to be very healthy which is excellent news. Fortunately there is no sign of active infection by way of odor or drainage at this point. Overall I'm very pleased with how things stand. He seems to be tolerating the Wound VAC without complication. 08/23/18 on evaluation today patient actually appears to be doing better in regard to his wound. He has been tolerating the Wound VAC without complication and in fact it has been collecting a significant amount of drainage which I think is good news especially considering how the wound appears. Fortunately there is no signs of infection at this time definitely nothing appears to be worse which is good news. He has not been  started on the Bactrim and Flagyl that was recommended by Dr. Delaine Lame yet. I did actually contact her office this morning in order to check and see were things are that regard their gonna be calling me back. 08/30/18 on evaluation today patient actually appears to show signs of excellent improvement today compared to last evaluation. The undermining is getting much better the wound seems to be feeling quite nicely and I'm very pleased that the granulation in general. With that being said overall I feel like the patient has made excellent progress which is great  news. No fevers, chills, nausea, or vomiting noted at this time. 09/06/18 on evaluation today patient actually appears to be doing rather well in regard to his right gluteal ulcer. This is showing signs of improvement in overall I'm very pleased with how things seem to be progressing. The patient likewise is please. Overall I see no evidence of infection he is about to complete his oral antibiotic regimen which is the end of the antibiotics for him in just about three days. 09/13/18 on evaluation today patient's right Ischial ulcer appears to be showing signs of continued improvement which is excellent news. He's been tolerating the dressing changes without complication. Fortunately there's no signs of infection and the wound that seems to be doing very well. 09/28/18 on evaluation today patient appears to be doing rather well in regard to his right Ischial ulcer. He's been tolerating the Wound VAC without complication he knows there's much less drainage than there used to be this obviously is not a bad thing in my pinion. There's no evidence of infection despite the fact is but nothing about it now for several weeks. 10/04/18 on evaluation today patient appears to be doing better in regard to his right Ischial wound. He has been tolerating the Wound VAC without complication and I do believe that the silver nitrate last week was beneficial  for him. Fortunately overall there's no evidence of active infection at this time which is great news. No fevers, chills, nausea, or vomiting noted at this time. 10/11/18 on evaluation today patient actually appears to be doing rather well in regard to his Ischial ulcer. He's been tolerating the Wound VAC still without complication I feel like this is doing a good job. No fevers, chills, nausea, or vomiting noted at this time. 11/01/18 on evaluation today patient presents after having not been seen in our clinic for several weeks secondary to the fact that he was on evaluation today patient presents after having not been seen in our clinic for several weeks secondary to the fact that he was in a skilled nursing facility which was on lockdown currently due to the covert 19 national emergency. Subsequently he was discharged from the facility on this past Friday and subsequently made an appointment to come in to see yesterday. Fortunately there's no signs of active infection at this time which is good news and overall he does seem to have made progress since I last saw. Overall I feel like things are progressing quite nicely. The patient is having no pain. 11/08/18 on evaluation today patient appears to be doing okay in regard to his right gluteal ulcer. He has been utilizing a Wound VAC home health this changing this at this point since he's home from the skilled nursing facility. Fortunately there's no signs of obvious active infection at this time. Unfortunately though there's no obvious active infection he is having some maceration and his wife states that when the sheets of the Wound VAC office on Sunday when it broke seal that he ended up having significant issues with some smell as well there concerned about the possibility of infection. Fortunately there's No fevers, chills, nausea, or vomiting noted at this time. [name], KLEVEN ([id number]) 11/15/18 on evaluation today patient actually  appears to be doing well in regard to his right gluteal ulcer. He has been tolerating the dressing changes without complication. Specifically the Wound VAC has been utilized up to this point. Fortunately there's no signs of infection and overall I feel like he has  made progress even since last week when I last saw him. I'm actually fairly happy with the overall appearance although he does seem to have somewhat of a hyper granular overgrowth in the central portion of the wound which I think may require some sharp debridement to try flatness out possibly utilizing chemical cauterization following. 11/23/18 on evaluation today patient actually appears to be doing very well in regard to his sacral ulcer. He seems to be showing signs of improvement with good granulation. With that being said he still has the small area of hyper granulation right in the central portion of the wound which I'm gonna likely utilize silver nitrate on today. Subsequently he also keeps having a leak at the 6 o'clock location which is unfortunate we may be able to help out with some suggestions to try to prevent this going forward. Fortunately there's no signs of active infection at this time. 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 He's been tolerating the dressing changes without complication. Fortunately there's no signs of active infection at this time. I've been rather pleased with how things have progressed there still some evidence of pressure getting to the area with some redness right around the immediate wound opening. Nonetheless other than this I'm not seeing any significant complications or issues the wound is somewhat hyper granular. Upon discussing with the patient and his wife today I'm not sure that the wound is being packed to the base with the foam at this point. And if it's not been packed fully that may be  part of the reason why is not seen as much improvement as far as the granulation from the base out. Again we do not want pack too tightly but we need some of the firm to get to the base of the wound. I discussed this with patient and his wife today. 12/06/18 on evaluation today patient appears to be doing well in regard to his right gluteal pressure ulcer. He's been tolerating the dressing changes without complication. Fortunately there's no signs of active infection. He still has some hyper granular tissue and I do think it would be appropriate to continue with the chemical cauterization as of today. 12/16/18 on evaluation today patient actually appears to be doing okay in regard to his right gluteal ulcer. He is been tolerating the dressing changes without complication including the Wound VAC. Overall I feel like nothing seems to be worsening I do feel like that the hyper granulation buds in the central portion of the wound have improved to some degree with the silver nitrate. We will have to see how things continue to progress. 12/20/18 on evaluation today patient actually appears to be doing much worse in my pinion even compared to last week's evaluation. Unfortunately as opposed to showing any signs of improvement the areas of hyper granular tissue in the central portion of the wound seem to be getting worse. Subsequently the wound bed itself also seems to be getting deeper even compared to last week which is both unusual as well as concerning since prior he had been shown signs of improvement. Nonetheless I think that the issue could be that he's actually having some difficulty in issues with a deeper infection. There's no external signs of infection but nonetheless I am more worried about the internal, osteomyelitis, that could be restarting. He has not been on antibiotics for some time at this point. I  think that it may be a good idea to go ahead and started back on an antibiotic therapy while  we wait to see what the testing shows. 12/27/18 on evaluation today patient presents for follow-up concerning his left gluteal fold wound. Fortunately he appears to be doing well today. I did review the CT scan which was negative for any signs of osteomyelitis or acute abnormality this is excellent news. Overall I feel like the surface of the wound bed appears to be doing significantly better today compared to previously noted findings. There does not appear any signs of infection nor does he have any pain at this time. 01/03/19 on evaluation today patient actually appears to be doing quite well in regard to his ulcer. Post debridement last week he really did not have too much bleeding which is good news. Fortunately today this seems to be doing some better but we still has some of the hyper granular tissue noted in the base of the wound which is gonna require sharp debridement today as well. Overall I'm pleased with how things seem to be progressing since we switched away from the Wound VAC I think he is making some progress. 01/10/19 on evaluation today patient appears to be doing better in regard to his right gluteal fold ulcer. He has been tolerating the dressing changes without complication. The debridement to seem to be helping with current away some of the poor hyper granular tissue bugs throughout the region of his gluteal fold wound. He's been tolerating the dressing changes otherwise without complication which is great news. No fevers, chills, nausea, or vomiting noted at this time. 01/17/19 on evaluation today patient actually appears to be doing excellent in regard to his wound. He's been tolerating the dressing changes without complication. Fortunately there is no signs of active infection at this time which is great news. No fevers, chills, nausea, or vomiting noted at this time. ROUDY, BONNETTE ([id number]) 01/24/19 on evaluation today patient actually appears to be doing quite well  with regard to his ulcer. He has been tolerating the dressing changes without complication. Fortunately there's no signs of active infection at this time. Overall been very pleased with the progress that he seems to be making currently. 01/31/19-Patient returns at 1 week with apparent similarity in dimensions to the wound, with no signs of infection, he has been changing dressings twice a day 02/08/19 upon evaluation today patient actually appears to be doing well with regard to his right Ischial ulcer. The wound is not appear to be quite as deep and seems to be making progress which is good news. With that being said I'm still reluctant to go back to the Wound VAC at this point. He's been having to change the dressings twice a day which is a little bit much in my pinion from the wound care supplies standpoint. I think that possibly attempting to utilize extras orbit may be beneficial this may also help to prevent any additional breakdown secondary to fluid retention in the wound itself. The patient is in agreement with giving this a try. 02/15/19 on evaluation today patient actually appears to be doing decently well with regard to his ulcer in the right to gluteal fold location. He's been tolerating the dressing changes without complication. Fortunately there is no signs of active infection at this time. He is able to keep the current dressing in place more effectively for a day at a time whereas before he was having a changes to to three times  a day. The actions or has been helpful in this regard. Fortunately there's no signs of anything getting worse and I do feel like he showing signs of good improvement with regard to the wound bed status. 02/22/2019 patient appears to be doing very well today with regard to his ulcer in the gluteal fold. Fortunately there is no signs of active infection and he has been tolerating the dressing changes without any complication. Overall extremely pleased with how  things seem to be progressing. He has much less of the hyper granular projections within the wound these have slowly been debrided away and he seems to be doing well. The wound bed is more uniform. 03/01/19 on evaluation today patient appears to be doing unfortunately about the same in regard to his gluteal ulcer. He's been tolerating the dressing changes without complication. Fortunately there's no signs of active infection at this time. With that being said he continues to develop these hyper granular projections which I'm unsure of exactly what they are and why they are rising. Nonetheless I explained to the patient that I do believe it would be a good idea for [place] to stand a biopsy sample for pathology to see if that can shed any light on what exactly may be going on here. Fortunately I do not see any obvious signs of infection. With that being said the patient has had a little bit more drainage this week apparently compared to last week. 03/08/2019 on evaluation today patient actually appears to look somewhat better with regard to the appearance of his wound bed at this time. This is good news. Overall I am very pleased with how things seem to have progressed just in the past week with a switch to the [facility] dressing. I think that has been beneficial for him. With that being said at this time the patient is concerned about his biopsy that I sent off last week unfortunately I do not have that report as of yet. Nonetheless we have called to obtain this and hopefully will hear back from the lab later this morning. 03/15/19 on evaluation today patient's wound actually appears to be doing okay today with regard to the overall appearance of the wound bed. He has been tolerating the dressing changes without complication he still has hyper granular tissue noted but fortunately that seems to be minimal at this point compared to some of what we've seen in the past. Nonetheless I do think that he is  still having some issues currently with some of this type of granulation the biopsy and since all showed nothing more than just evidence of granulation tissue. Therefore there really is nothing different to initiate or do at this point. 03/24/19 on evaluation today patient appears to be doing a little better with regard to his ulcer. He's been tolerating the dressing changes without complication. Fortunately there is no signs of active infection at this time. No fevers, chills, nausea, or vomiting noted at this time. I'm overall pleased with how things seem to be progressing. 03/29/2019 on evaluation today patient appears to be doing about the same in regard to his ulcer in the right gluteal fold. Unfortunately he is not seeming to make a lot of progress and the wound is somewhat stalled. There is no signs of active infection externally but I am concerned about the possibility of infection continuing in the ischial location which previously he did have infection noted. Again is not able to have an MRI so probably our best option  for testing for this would be a triple phase bone scan which will detect subtle changes in the bone more so than plain film x-rays. In the past they really have not been beneficial and in fact the CT scans even have been somewhat questionable at times. Nonetheless there is no signs of systemic infection which is at least good news but again his wound is not healing at all on the predicted schedule. 04/05/19 on evaluation today patient appears to be doing well all things considering with regard to his wound and the right gluteal fold. He actually has his triple bone scan scheduled for sometime in the next couple of days. With that being said I've also been looking to other possibilities of what could be causing hyper granular tissue were looking into the bone scan again for evaluating for the risk or possibility of infection deeper and I'm also gonna go ahead and see about obtaining  a deep tissue Tandy, Anselmo J. ([id number]) culture today to send and see if there's any evidence of infection noted on culture. He's in agreement with that plan. 04/12/2019 on evaluation today patient presents for reevaluation here in our clinic concerning ongoing issues with his right gluteal fold ulcer. 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 It was discussed with him at that point that I think it would be appropriate for him to proceed with hyperbaric oxygen therapy especially in light of the fact that he is previously been on IV antibiotics at the beginning of the year for close to 3 months followed by several weeks of oral antibiotics that was all prescribed by infectious disease. He had surgical debridement around Christmas December 2019 due to an abscess and osteomyelitis of the ischial bone. Unfortunately this has not really proceeded as well as we would have liked and again we did a CT scan even a couple months ago as he cannot have an MRI secondary to having issues with both a pain pump as well as a spinal cord stimulator which prevent him from going into an MRI machine. With that being said there were chronic changes noted at the ischial tuberosity which had progressed since December 2019 there was no evidence of fluid collection on that initial CT scan. With that being said that was on January 21, 2019. I am not sure that it was a sensitive enough test as compared to an MRI and then subsequently I ordered a bone scan for him which was actually completed on 03/29/2019 and this revealed that he does indeed have positive osteomyelitis involving the right ischial tuberosity. This is adjacent to the ulcer and I think is the reason that his ulcer is not healing. Subsequently I am in a place him back on oral antibiotics today unfortunately his wound culture showed just mixed  gram-negative flora with no specific findings of a predominant organism. Nonetheless being with the location I think a good broad-spectrum antibiotic for gram-negative's is a good choice at this point he is previously taken Levaquin without significant issues and I think that is an appropriate antibiotic for him at this time. 04/19/2019 on evaluation today patient actually appears to be doing fairly well with regard to his wound. He has been tolerating the dressing changes without complication. Fortunately there is no signs of active infection and he has been taking the oral antibiotics at this time. Subsequently we did make a referral  to infectious disease although Dr. Steva Ready wants the patient to be seen by general surgery first in order apparently to see if there is anything from a surgical standpoint that should be done prior to initiation of IV antibiotic therapy. Again the patient is okay with actually seeing Dr. Celine Ahr whom he has seen before we will make that referral. 04/26/2019 on evaluation today patient actually appears to be doing well with regard to his wound. He has been tolerating the dressing changes without complication. Fortunately there is no signs of active infection at this time. I do believe that the hyperbaric oxygen therapy along with the antibiotics which I prescribed at this point have been doing well for him. With that being said he has not seen Dr. Celine Ahr the surgeon yet in fact they have not been contacted for scheduling an appointment as of yet. Subsequently the patient is not on antibiotics currently by IV Dr. Steva Ready did want him to see Dr. Celine Ahr first which we are working on trying to get scheduled. We again give the information to the patient today for Dr. Celine Ahr and her number as far as contacting their office to see about getting something scheduled. Again were looking for whether or not they recommend any surgical intervention. Patient History Information  obtained from Patient. Family History Hypertension - Father, Stroke - Mother, No family history of Cancer, Diabetes, Heart Disease, Kidney Disease, Lung Disease, Seizures, Thyroid Problems, Tuberculosis. Social History Never smoker, Marital Status - Married, Alcohol Use - Never, Drug Use - No History, Caffeine Use - Daily. Medical History Eyes Patient has history of Cataracts - both removed Denies history of Glaucoma, Optic Neuritis Ear/Nose/Mouth/Throat Denies history of Chronic sinus problems/congestion, Middle ear problems Hematologic/Lymphatic Denies history of Anemia, Hemophilia, Human Immunodeficiency Virus, Lymphedema Respiratory Denies history of Aspiration, Asthma, Chronic Obstructive Pulmonary Disease (COPD), Pneumothorax, Sleep Apnea, Tuberculosis Cardiovascular [name], [name]. ([id number]) Patient has history of Hypertension - takes medication Denies history of Angina, Arrhythmia, Congestive Heart Failure, Coronary Artery Disease, Deep Vein Thrombosis, Hypotension, Myocardial Infarction, Peripheral Arterial Disease, Peripheral Venous Disease, Phlebitis, Vasculitis Gastrointestinal Denies history of Cirrhosis , Colitis, Crohn s, Hepatitis A, Hepatitis B, Hepatitis C Endocrine Denies history of Type I Diabetes, Type II Diabetes Genitourinary Denies history of End Stage Renal Disease Immunological Denies history of Lupus Erythematosus, Raynaud s, Scleroderma Integumentary (Skin) Denies history of History of Burn, History of pressure wounds Musculoskeletal Denies history of Gout, Rheumatoid Arthritis, Osteoarthritis, Osteomyelitis Neurologic Patient has history of Paraplegia - waist down Denies history of Dementia, Neuropathy, Quadriplegia, Seizure Disorder Oncologic Denies history of Received Chemotherapy, Received Radiation Psychiatric Denies history of Anorexia/bulimia, Confinement Anxiety Medical And Surgical History Notes Oncologic Prostate cancer-  currently treated with horomone therapy Review of Systems (ROS) Constitutional Symptoms (General Health) Denies complaints or symptoms of Fatigue, Fever, Chills, Marked Weight Change. Respiratory Denies complaints or symptoms of Chronic or frequent coughs, Shortness of Breath. Cardiovascular Denies complaints or symptoms of Chest pain, LE edema. Psychiatric Denies complaints or symptoms of Anxiety, Claustrophobia. Objective Constitutional Well-nourished and well-hydrated in no acute distress. Vitals Time Taken: 10:15 AM, Height: 73 in, Weight: 210 lbs, BMI: 27.7, Temperature: 98.1 F, Pulse: 78 bpm, Respiratory Rate: 16 breaths/min, Blood Pressure: 124/70 mmHg. Respiratory normal breathing without difficulty. clear to auscultation bilaterally. Cardiovascular regular rate and rhythm with normal S1, S2. Christenberry, Jobanny J. ([id number]) Psychiatric this patient is able to make decisions and demonstrates good insight into disease process. Alert and Oriented x 3. pleasant and cooperative. General Notes: Since  wound bed currently does still show some hyper granular tissue in the base of the wound but fortunately this does not appear to be too significant and does not seem to be worsening. With that being said overall I do feel like the appearance of the wound has improved compared to previous evaluations. He is still on oral antibiotics as prescribed by myself. Integumentary (Hair, Skin) Wound #1 status is Open. Original cause of wound was Pressure Injury. The wound is located on the Right Gluteal fold. The wound measures 2.8cm length x 3.6cm width x 2.4cm depth; 7.917cm^2 area and 19cm^3 volume. There is muscle and Fat Layer (Subcutaneous Tissue) Exposed exposed. There is tunneling at 12:00 with a maximum distance of 4cm. There is a large amount of serosanguineous drainage noted. The wound margin is epibole. There is medium (34-66%) red, pink, hyper - granulation within the wound bed.  There is a medium (34-66%) amount of necrotic tissue within the wound bed including Adherent Slough. 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 Skin Barriers/Peri-Wound Care: Wound #1 Right Gluteal fold: Other: - Apply zinc oxide to peri-wound area. Primary Wound Dressing: Wound #1 Right Gluteal fold: Hydrafera Blue Ready Transfer Secondary Dressing: Wound #1 Right Gluteal fold: XtraSorb - Secure with Tegaderm Dressing Change Frequency: Wound #1 Right Gluteal fold: Mckeever, Maliq J. ([id number]) Change dressing every day. Follow-up Appointments: Wound #1 Right Gluteal fold: Return Appointment in 1 week. 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 My suggestion currently is good to be that we continue with the current wound care measures for the time being and the patient is in agreement with that plan. 2. I am also going to suggest that we currently go ahead and continue with hyperbaric oxygen therapy as I do feel like the patient's wound is showing signs of improvement with [facility]  therapy. 3. I do still recommend appropriate offloading which I think the patient has been doing in my opinion. We will see patient back for reevaluation in 1 week here in the clinic. If anything worsens or changes patient will contact our office for additional recommendations. Electronic Signature(s) Signed: 04/26/2019 12:59:04 PM By: Worthy Keeler [name] Entered By: Worthy Keeler on 04/26/2019 12:59:04 RICH, YAEGER ([id number]) -------------------------------------------------------------------------------- ROS/PFSH Details Patient Name: [name] Date of Service: 04/26/2019 10:30 AM Medical Record Number: [id number] Patient Account Number: [id number] Date of Birth/Sex: 1950-10-16 (68 y.o. M) Treating RN: Montey Hora Primary Care Provider: [name] Other Clinician: Referring Provider: [name] Treating Provider/Extender: [name] Weeks in Treatment: 20 Information Obtained From Patient Constitutional Symptoms (General Health) Complaints and Symptoms: Negative for: Fatigue; Fever; Chills; Marked Weight Change Respiratory Complaints and Symptoms: Negative for: Chronic or frequent coughs; Shortness of Breath Medical History: Negative  for: Aspiration; Asthma; Chronic Obstructive Pulmonary Disease (COPD); Pneumothorax; Sleep Apnea; Tuberculosis Cardiovascular Complaints and Symptoms: Negative for: Chest pain; LE edema Medical History: Positive for: Hypertension - takes medication Negative for: Angina; Arrhythmia; Congestive Heart Failure; Coronary Artery Disease; Deep Vein Thrombosis; Hypotension; Myocardial Infarction; Peripheral Arterial Disease; Peripheral Venous Disease; Phlebitis; Vasculitis Psychiatric Complaints and Symptoms: Negative for: Anxiety; Claustrophobia Medical History: Negative for: Anorexia/bulimia; Confinement Anxiety Eyes Medical History: Positive for: Cataracts - both removed Negative for: Glaucoma; Optic  Neuritis Ear/Nose/Mouth/Throat Medical History: Negative for: Chronic sinus problems/congestion; Middle ear problems Hematologic/Lymphatic Medical History: Negative for: Anemia; Hemophilia; Human Immunodeficiency Virus; Lymphedema SHIAH, QUALE. ([id number]) Gastrointestinal Medical History: Negative for: Cirrhosis ; Colitis; Crohnos; Hepatitis A; Hepatitis B; Hepatitis C Endocrine Medical History: Negative for: Type I Diabetes; Type II Diabetes Genitourinary Medical History: Negative for: End Stage Renal Disease Immunological Medical History: Negative for: Lupus Erythematosus; Raynaudos; Scleroderma Integumentary (Skin) Medical History: Negative for: History of Burn; History of pressure wounds Musculoskeletal Medical History: Negative for: Gout; Rheumatoid Arthritis; Osteoarthritis; Osteomyelitis Neurologic Medical History: Positive for: Paraplegia - waist down Negative for: Dementia; Neuropathy; Quadriplegia; Seizure Disorder Oncologic Medical History: Negative for: Received Chemotherapy; Received Radiation Past Medical History Notes: Prostate cancer- currently treated with horomone therapy HBO Extended History Items Eyes: Cataracts Immunizations Pneumococcal Vaccine: Received Pneumococcal Vaccination: No Implantable Devices No devices added Family and Social History Cancer: No; Diabetes: No; Heart Disease: No; Hypertension: Yes - Father; Kidney Disease: No; Lung Disease: No; Seizures: No; Stroke: Yes - Mother; Thyroid Problems: No; Tuberculosis: No; Never smoker; Marital Status - Married; Alcohol Use: Never; Drug Use: No History; Caffeine Use: Daily ERYX, FARRUGGIA ([id number]) Physician Affirmation I have reviewed and agree with the above information. Electronic Signature(s) Signed: 04/26/2019 4:54:06 PM By: Montey Hora Signed: 04/26/2019 5:57:32 PM By: Worthy Keeler [name] Entered By: Worthy Keeler on 04/26/2019 12:58:12 SAUNDERS, HEINZMAN  ([id number]) -------------------------------------------------------------------------------- SuperBill Details Patient Name: [name] Date of Service: 04/26/2019 Medical Record Number: [id number] Patient Account Number: [id number] Date of Birth/Sex: 01-10-1951 (68 y.o. 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

## 2019-04-27 ENCOUNTER — Encounter: Payer: Medicare Other | Admitting: Internal Medicine

## 2019-04-27 ENCOUNTER — Other Ambulatory Visit: Payer: Self-pay

## 2019-04-27 DIAGNOSIS — L89314 Pressure ulcer of right buttock, stage 4: Secondary | ICD-10-CM | POA: Diagnosis not present

## 2019-04-27 NOTE — Progress Notes (Signed)
[name], [name] ([id number]) Visit Report for 04/26/2019 Problem List Details Patient Name: [name], [name] Date of Service: 04/26/2019 8:00 AM Medical Record Number: [id number] Patient Account Number: [id number] Date of Birth/Sex: 1951-04-16 (68 y.o. M) Treating RN: Primary Care Provider: [name] Other Clinician: Referring Provider: [name] Treating Provider/Extender: [name], HOYT Weeks in Treatment: 44 Active Problems ICD-10 Evaluated Encounter Code Description Active Date Today Diagnosis L89.314 Pressure ulcer of right buttock, stage 4 11/30/2017 No Yes M86.68 Other chronic osteomyelitis, other site 04/08/2019 No Yes L03.317 Cellulitis of buttock 06/21/2018 No Yes G82.20 Paraplegia, unspecified 11/30/2017 No Yes S34.109S Unspecified injury to unspecified level of lumbar spinal cord, 11/30/2017 No Yes sequela I10 Essential (primary) hypertension 11/30/2017 No Yes Inactive Problems Resolved Problems Electronic Signature(s) Signed: 04/26/2019 5:59:04 PM By: Worthy Keeler [name] Entered By: Worthy Keeler on 04/26/2019 17:59:03 [name] ([id number]) -------------------------------------------------------------------------------- SuperBill Details Patient Name: [name] Date of Service: 04/26/2019 Medical Record Number: [id number] Patient Account Number: [id number] Date of Birth/Sex: [birth year] (68 y.o. 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, sequela I10 Essential (primary) hypertension Facility Procedures CPT4 Code:  [id number] Description: (Facility Use Only) HBOT, full body chamber, 31min Modifier: Quantity: 4 Physician Procedures CPT4 Code: [id number] Description: [id number] - WC PHYS HYPERBARIC OXYGEN THERAPY ICD-10 Diagnosis Description M86.68 Other chronic osteomyelitis, other site L89.314 Pressure ulcer of right buttock, stage 4 G82.20 Paraplegia, unspecified S34.109S Unspecified injury to unspecified  level of lumbar spinal cord, Modifier: sequela Quantity: 1 Electronic Signature(s) Signed: 04/26/2019 5:59:01 PM By: Worthy Keeler [name] Previous Signature: 04/26/2019 4:06:56 PM Version By: Lorine Bears RCP, RRT, CHT Previous Signature: 04/26/2019 5:57:32 PM Version By: Worthy Keeler [name] Entered By: Worthy Keeler on 04/26/2019 17:59:00

## 2019-04-27 NOTE — Progress Notes (Addendum)
VALTON, HAPP ([id number]) Visit Report for 04/26/2019 HBO Details Patient Name: [name], [name] Date of Service: 04/26/2019 8:00 AM Medical Record Number: [id number] Patient Account Number: [id number] Date of Birth/Sex: Oct 04, 1950 (68 y.o. 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 HBO Treatment Details Treatment Number: 7 Patient Type: Outpatient Chamber Type: Monoplace Chamber Serial #: [id number] Treatment Protocol: 2.0 ATA with 90 minutes oxygen, and no air breaks Treatment Details Compression Rate Down: 1.5 psi / minute De-Compression Rate Up: 1.5 psi / minute Compress Tx Pressure Air breaks and breathing periods Decompress Decompress Begins Reached (leave unused spaces blank) Begins Ends Chamber Pressure (ATA) 1 2 - - - - - - 2 1 Clock Time (24 hr) 08:19 08:31 - - - - - - 10:02 10:11 Treatment Length: 112 (minutes) Treatment Segments: 4 Vital Signs Capillary Blood Glucose Reference Range: 80 - 120 mg / dl HBO Diabetic Blood Glucose Intervention Range: <131 mg/dl or >249 mg/dl Time Vitals Blood Respiratory Capillary Blood Glucose Pulse Action Type: Pulse: Temperature: Taken: Pressure: Rate: Glucose (mg/dl): Meter #: Oximetry (%) Taken: Pre 08:10 122/68 72 16 98.7 Post 10:15 124/70 78 16 98.1 Treatment Response Treatment Toleration: Well Treatment Completion Treatment Completed without Adverse Event Status: HBO Attestation I certify that I supervised this HBO treatment in accordance with Medicare guidelines. A trained emergency response Yes team is readily available per hospital policies and  procedures. Continue HBOT as ordered. Yes Electronic Signature(s) MAXMILIAN, GRANNAN ([id number]) Signed: 04/26/2019 5:58:56 PM By: Worthy Keeler [name] Previous Signature: 04/26/2019 4:06:56 PM Version By: Lorine Bears RCP, RRT, CHT Previous Signature: 04/26/2019 5:57:32 PM Version By: Worthy Keeler [name] Entered By: Worthy Keeler on 04/26/2019 17:58:55 [name], [name] ([id number]) -------------------------------------------------------------------------------- HBO Safety Checklist Details Patient Name: [name] Date of Service: 04/26/2019 8:00 AM Medical Record Number: [id number] Patient Account Number: [id number] Date of Birth/Sex: March 12, 1951 (68 y.o. M) Treating RN: Primary Care Joyclyn Plazola: [name] Other Clinician: Referring Ginelle Bays: [name] Treating Brinlyn Cena/Extender: [name], HOYT Weeks in Treatment: 64 HBO Safety Checklist Items Safety Checklist Consent Form Signed Patient voided / foley secured and emptied When did you last eato morning Last dose of injectable or oral agent n/a Ostomy pouch emptied and vented if applicable NA Medtronic Baclofen Pump; Nevro All implantable devices assessed, documented and approved Spinal Stimulator Intravenous access site secured and place NA Valuables secured Linens and cotton and cotton/polyester blend (less than 51% polyester) Personal oil-based products / skin lotions / body lotions removed Wigs or hairpieces removed NA Smoking or tobacco materials removed NA Books / newspapers / magazines / loose paper removed Cologne, aftershave, perfume and deodorant removed Jewelry removed (may wrap wedding band) Make-up removed NA Hair care products removed Battery operated devices (external) removed Heating patches and chemical warmers removed Titanium eyewear removed NA Nail polish cured greater than 10 hours NA Casting material cured greater than 10 hours NA Hearing aids  removed NA Loose dentures or partials removed NA Prosthetics have been removed NA Patient demonstrates correct use of air break device (if applicable) Patient concerns have been addressed Patient grounding bracelet on and cord attached to chamber Specifics for Inpatients (complete in addition to above) Medication sheet sent with patient Intravenous medications  needed or due during therapy sent with patient Drainage tubes (e.g. nasogastric tube or chest tube secured and vented) Endotracheal or Tracheotomy tube secured Cuff deflated of air and inflated with saline Airway suctioned Electronic Signature(s) [name], [name] ([id number]) Signed: 04/26/2019 4:06:56 PM By: Lorine Bears RCP, RRT, CHT Entered By: Becky Sax, Amado Nash on 04/26/2019 08:28:02

## 2019-04-27 NOTE — Progress Notes (Signed)
[name], [name] ([id number]) Visit Report for 04/27/2019 HBO Details Patient Name: [name], [name] Date of Service: 04/27/2019 8:00 AM Medical Record Number: [id number] Patient Account Number: [id number] Date of Birth/Sex: December 28, 1950 (68 y.o. 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 HBO Treatment Details Treatment Number: 8 Patient Type: Outpatient Chamber Type: Monoplace Chamber Serial #: [id number] Treatment Protocol: 2.0 ATA with 90 minutes oxygen, and no air breaks Treatment Details Compression Rate Down: 1.5 psi / minute De-Compression Rate Up: 1.5 psi / minute Compress Tx Pressure Air breaks and breathing periods Decompress Decompress Begins Reached (leave unused spaces blank) Begins Ends Chamber Pressure (ATA) 1 2 - - - - - - 2 1 Clock Time (24 hr) 08:22 08:34 - - - - - - 10:04 10:14 Treatment Length: 112 (minutes) Treatment Segments: 4 Vital Signs Capillary Blood Glucose Reference Range: 80 - 120 mg / dl HBO Diabetic Blood Glucose Intervention Range: <131 mg/dl or >249 mg/dl Time Vitals Blood Respiratory Capillary Blood Glucose Pulse Action Type: Pulse: Temperature: Taken: Pressure: Rate: Glucose (mg/dl): Meter #: Oximetry (%) Taken: Pre 08:10 116/64 78 16 98.3 Post 10:18 122/62 60 16 98.1 Treatment Response Treatment Completion Status: Treatment Completed without Adverse Event [name] Notes No concerns with treatment given HBO Attestation I certify that I supervised this HBO treatment in accordance with Medicare guidelines. A trained emergency response Yes team is readily available per  hospital policies and procedures. Continue HBOT as ordered. Yes Electronic Signature(s) Brett, [name] ([id number]) Signed: 04/27/2019 5:14:46 PM By: Brett Ham MD Previous Signature: 04/27/2019 3:01:16 PM Version By: Lorine Bears RCP, RRT, CHT Entered By: [name] on 04/27/2019 17:12:13 [name], [name] ([id number]) -------------------------------------------------------------------------------- HBO Safety Checklist Details Patient Name: [name] Date of Service: 04/27/2019 8:00 AM Medical Record Number: [id number] Patient Account Number: [id number] Date of Birth/Sex: March 30, 1951 (68 y.o. M) Treating RN: Primary Care [name]: [name] Other Clinician: Referring Sanvi Ehler: [name] Treating [name]: [name] in Treatment: 44 HBO Safety Checklist Items Safety Checklist Consent Form Signed Patient voided / foley secured and emptied When did you last eato morning Last dose of injectable or oral agent n/a Ostomy pouch emptied and vented if applicable NA Medtronic Baclofen Pump; Nevro All implantable devices assessed, documented and approved Spinal Stimulator Intravenous access site secured and place NA Valuables secured Linens and cotton and cotton/polyester blend (less than 51% polyester) Personal oil-based products / skin lotions / body lotions removed Wigs or hairpieces removed NA Smoking or tobacco materials removed NA Books / newspapers / magazines / loose paper removed Cologne, aftershave, perfume and deodorant removed Jewelry removed (may wrap wedding band) Make-up removed NA Hair care products removed Battery operated devices (external) removed Heating patches and chemical warmers removed Titanium eyewear removed NA Nail polish cured greater than 10 hours NA Casting material cured greater than 10 hours NA Hearing aids removed NA Loose dentures or partials  removed NA Prosthetics have been removed NA Patient demonstrates correct use of air break device (if applicable) Patient concerns have been addressed Patient grounding bracelet on and cord attached to chamber Specifics for Inpatients (complete in addition to above) Medication sheet sent with patient Intravenous medications needed or due during therapy sent with patient Drainage  tubes (e.g. nasogastric tube or chest tube secured and vented) Endotracheal or Tracheotomy tube secured Cuff deflated of air and inflated with saline Airway suctioned Electronic Signature(s) Brett, [name] ([id number]) Signed: 04/27/2019 3:01:16 PM By: Lorine Bears RCP, RRT, CHT Entered By: [name], [name] on 04/27/2019 08:27:42

## 2019-04-27 NOTE — Progress Notes (Signed)
RYKAN, VITTETOE ([id number]) Visit Report for 04/25/2019 Problem List Details Patient Name: [name], [name] Date of Service: 04/25/2019 8:00 AM Medical Record Number: [id number] Patient Account Number: [id number] Date of Birth/Sex: 05/29/1951 (68 y.o. M) Treating RN: Primary Care Provider: [name] Other Clinician: Referring Provider: [name] Treating Provider/Extender: [name], HOYT Weeks in Treatment: 86 Active Problems ICD-10 Evaluated Encounter Code Description Active Date Today Diagnosis L89.314 Pressure ulcer of right buttock, stage 4 11/30/2017 No Yes M86.68 Other chronic osteomyelitis, other site 04/08/2019 No Yes L03.317 Cellulitis of buttock 06/21/2018 No Yes G82.20 Paraplegia, unspecified 11/30/2017 No Yes S34.109S Unspecified injury to unspecified level of lumbar spinal cord, 11/30/2017 No Yes sequela I10 Essential (primary) hypertension 11/30/2017 No Yes Inactive Problems Resolved Problems Electronic Signature(s) Signed: 04/26/2019 5:58:01 PM By: Worthy Keeler [name] Entered By: Worthy Keeler on 04/26/2019 17:58:01 [name] ([id number]) -------------------------------------------------------------------------------- SuperBill Details Patient Name: [name] Date of Service: 04/25/2019 Medical Record Number: [id number] Patient Account Number: [id number] Date of Birth/Sex: Feb 01, 1951 (68 y.o. 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, sequela I10 Essential (primary) hypertension Facility Procedures CPT4 Code:  [id number] Description: (Facility Use Only) HBOT, full body chamber, 64min Modifier: Quantity: 4 Physician Procedures CPT4 Code: [id number] Description: [id number] - WC PHYS HYPERBARIC OXYGEN THERAPY ICD-10 Diagnosis Description M86.68 Other chronic osteomyelitis, other site L89.314 Pressure ulcer of right buttock, stage 4 G82.20 Paraplegia, unspecified S34.109S Unspecified injury to unspecified  level of lumbar spinal cord, Modifier: sequela Quantity: 1 Electronic Signature(s) Signed: 04/26/2019 5:57:58 PM By: Worthy Keeler [name] Previous Signature: 04/25/2019 2:52:22 PM Version By: Lorine Bears RCP, RRT, CHT Previous Signature: 04/25/2019 4:22:49 PM Version By: Worthy Keeler [name] Entered By: Worthy Keeler on 04/26/2019 17:57:58

## 2019-04-27 NOTE — Progress Notes (Signed)
QUINTYN, KASSIM ([id number]) Visit Report for 04/27/2019 Arrival Information Details Patient Name: [name], [name] Date of Service: 04/27/2019 8:00 AM Medical Record Number: [id number] Patient Account Number: [id number] Date of Birth/Sex: 21-Aug-1950 (68 y.o. M) Treating RN: Primary Care Adaliz Dobis: [name] Other Clinician: Referring Eustace Hur: [name] Treating Johnston Maddocks/Extender: [name] in Treatment: 30 Visit Information History Since Last Visit Added or deleted any medications: No Patient Arrived: Wheel Chair Any new allergies or adverse reactions: No Arrival Time: 07:57 Had a fall or experienced change in No Accompanied By: wife activities of daily living that may affect Transfer Assistance: Harrel Lemon Lift risk of falls: Patient Identification Verified: Yes Signs or symptoms of abuse/neglect since last visito No Secondary Verification Process Completed: Yes Hospitalized since last visit: No Patient Requires Transmission-Based No Implantable device outside of the clinic excluding No Precautions: cellular tissue based products placed in the center Patient Has Alerts: Yes since last visit: Patient Alerts: NOT Has Dressing in Place as Prescribed: Yes Diabetic Pain Present Now: No Electronic Signature(s) Signed: 04/27/2019 3:01:16 PM By: Lorine Bears RCP, RRT, CHT Entered By: Lorine Bears on 04/27/2019 08:25:16 [name], [name] ([id number]) -------------------------------------------------------------------------------- Encounter Discharge Information Details Patient Name: [name] Date of Service: 04/27/2019 8:00 AM Medical Record Number: [id number] Patient Account Number: [id number] Date of Birth/Sex: 1951-06-16 (68 y.o. M) Treating RN: Primary Care Kryssa Risenhoover: [name] Other Clinician: Referring Maragret Vanacker: [name] Treating Eladio Dentremont/Extender: [name] in Treatment:  31 Encounter Discharge Information Items Discharge Condition: Stable Ambulatory Status: Wheelchair Discharge Destination: Home Transportation: Private Auto Accompanied By: wife Schedule Follow-up Appointment: Yes Clinical Summary of Care: Notes Patient has an HBO treatment scheduled on 04/28/2019 at 08:00 am. Electronic Signature(s) Signed: 04/27/2019 3:01:16 PM By: Lorine Bears RCP, RRT, CHT Entered By: Lorine Bears on 04/27/2019 10:21:24 [name], [name] ([id number]) -------------------------------------------------------------------------------- Vitals Details Patient Name: [name] Date of Service: 04/27/2019 8:00 AM Medical Record Number: [id number] Patient Account Number: [id number] Date of Birth/Sex: 12-Aug-1950 (68 y.o. M) Treating RN: Primary Care Lamekia [name]: [name] Other Clinician: Referring [name] [name]: [name] Treating Cierah Crader/Extender: [name] in Treatment: 44 Vital Signs Time Taken: 08:10 Temperature (F): 98.3 Height (in): 73 Pulse (bpm): 78 Weight (lbs): 210 Respiratory Rate (breaths/min): 16 Body Mass Index (BMI): 27.7 Blood Pressure (mmHg): 116/64 Reference Range: 80 - 120 mg / dl Electronic Signature(s) Signed: 04/27/2019 3:01:16 PM By: Lorine Bears RCP, RRT, CHT Entered By: Lorine Bears on 04/27/2019 08:26:19

## 2019-04-28 ENCOUNTER — Encounter: Payer: Medicare Other | Admitting: Physician Assistant

## 2019-04-28 DIAGNOSIS — L89314 Pressure ulcer of right buttock, stage 4: Secondary | ICD-10-CM | POA: Diagnosis not present

## 2019-04-28 NOTE — Progress Notes (Signed)
MALAKIAH, MATHIESEN ([id number]) Visit Report for 04/28/2019 Arrival Information Details Patient Name: [name], [name] Date of Service: 04/28/2019 8:00 AM Medical Record Number: [id number] Patient Account Number: [id number] Date of Birth/Sex: [birth year] (68 y.o. M) Treating RN: Primary Care Eurydice Calixto: [name] Other Clinician: Referring Reace Breshears: [name] Treating Willeen Novak/Extender: [name], HOYT Weeks in Treatment: 15 Visit Information History Since Last Visit Added or deleted any medications: No Patient Arrived: Wheel Chair Any new allergies or adverse reactions: No Arrival Time: 08:00 Had a fall or experienced change in No Accompanied By: wife activities of daily living that may affect Transfer Assistance: Harrel Lemon Lift risk of falls: Patient Identification Verified: Yes Signs or symptoms of abuse/neglect since last visito No Secondary Verification Process Completed: Yes Hospitalized since last visit: No Patient Requires Transmission-Based No Implantable device outside of the clinic excluding No Precautions: cellular tissue based products placed in the center Patient Has Alerts: Yes since last visit: Patient Alerts: NOT Has Dressing in Place as Prescribed: Yes Diabetic Pain Present Now: No Electronic Signature(s) Signed: 04/28/2019 10:54:20 AM By: Lorine Bears RCP, RRT, CHT Entered By: Lorine Bears on 04/28/2019 08:33:36 [name], [name] ([id number]) -------------------------------------------------------------------------------- Encounter Discharge Information Details Patient Name: [name] Date of Service: 04/28/2019 8:00 AM Medical Record Number: [id number] Patient Account Number: [id number] Date of Birth/Sex: 1951-03-02 (68 y.o. M) Treating RN: Primary Care Hanz Winterhalter: [name] Other Clinician: Referring Pacen Watford: [name] Treating Roselynne Lortz/Extender: [name], HOYT Weeks in Treatment:  34 Encounter Discharge Information Items Discharge Condition: Stable Ambulatory Status: Wheelchair Discharge Destination: Home Transportation: Private Auto Accompanied By: wife Schedule Follow-up Appointment: Yes Clinical Summary of Care: Notes Patient has an HBO treatment scheduled on 04/29/2019 at 08:00 am. Electronic Signature(s) Signed: 04/28/2019 10:54:20 AM By: Lorine Bears RCP, RRT, CHT Entered By: Lorine Bears on 04/28/2019 10:28:49 [name], [name] ([id number]) -------------------------------------------------------------------------------- Vitals Details Patient Name: [name] Date of Service: 04/28/2019 8:00 AM Medical Record Number: [id number] Patient Account Number: [id number] Date of Birth/Sex: 10/10/1950 (68 y.o. M) Treating RN: Primary Care Laqueisha Catalina: [name] Other Clinician: Referring Dhara Schepp: [name] Treating Noemy Hallmon/Extender: [name], HOYT Weeks in Treatment: 10 Vital Signs Time Taken: 08:15 Temperature (F): 98.7 Height (in): 73 Pulse (bpm): 66 Weight (lbs): 210 Respiratory Rate (breaths/min): 16 Body Mass Index (BMI): 27.7 Blood Pressure (mmHg): 116/60 Reference Range: 80 - 120 mg / dl Electronic Signature(s) Signed: 04/28/2019 10:54:20 AM By: Lorine Bears RCP, RRT, CHT Entered By: Lorine Bears on 04/28/2019 08:34:11

## 2019-04-29 ENCOUNTER — Other Ambulatory Visit: Payer: Self-pay

## 2019-04-29 ENCOUNTER — Encounter: Payer: Medicare Other | Admitting: Physician Assistant

## 2019-04-29 DIAGNOSIS — L89314 Pressure ulcer of right buttock, stage 4: Secondary | ICD-10-CM | POA: Diagnosis not present

## 2019-04-29 NOTE — Progress Notes (Signed)
OSWIN, STOOP ([id number]) Visit Report for 04/29/2019 Arrival Information Details Patient Name: [name], [name] Date of Service: 04/29/2019 8:00 AM Medical Record Number: [id number] Patient Account Number: [id number] Date of Birth/Sex: [birth year] (68 y.o. M) Treating RN: Primary Care Delpha Perko: [name] Other Clinician: Referring Ruth Kovich: [name] Treating Lexington Devine/Extender: [name], HOYT Weeks in Treatment: 72 Visit Information History Since Last Visit Added or deleted any medications: No Patient Arrived: Wheel Chair Any new allergies or adverse reactions: No Arrival Time: 08:05 Had a fall or experienced change in No Accompanied By: wife activities of daily living that may affect Transfer Assistance: Harrel Lemon Lift risk of falls: Patient Identification Verified: Yes Signs or symptoms of abuse/neglect since last visito No Secondary Verification Process Completed: Yes Hospitalized since last visit: No Patient Requires Transmission-Based No Implantable device outside of the clinic excluding No Precautions: cellular tissue based products placed in the center Patient Has Alerts: Yes since last visit: Patient Alerts: NOT Has Dressing in Place as Prescribed: Yes Diabetic Pain Present Now: No Electronic Signature(s) Signed: 04/29/2019 10:26:54 AM By: Lorine Bears RCP, RRT, CHT Entered By: Lorine Bears on 04/29/2019 08:31:30 [name], [name] ([id number]) -------------------------------------------------------------------------------- Encounter Discharge Information Details Patient Name: [name] Date of Service: 04/29/2019 8:00 AM Medical Record Number: [id number] Patient Account Number: [id number] Date of Birth/Sex: Sep 12, 1950 (68 y.o. M) Treating RN: Primary Care Geraldene Eisel: [name] Other Clinician: Referring Carlester Kasparek: [name] Treating Waylon Hershey/Extender: [name], HOYT Weeks in Treatment:  27 Encounter Discharge Information Items Discharge Condition: Stable Ambulatory Status: Wheelchair Discharge Destination: Home Transportation: Private Auto Accompanied By: daughter, Joelene Millin Schedule Follow-up Appointment: Yes Clinical Summary of Care: Notes Patient has an HBO treatment scheduled on 05/02/2019 at 08:00 am. Electronic Signature(s) Signed: 04/29/2019 10:26:54 AM By: Lorine Bears RCP, RRT, CHT Entered By: Lorine Bears on 04/29/2019 10:26:39 [name], [name] ([id number]) -------------------------------------------------------------------------------- Vitals Details Patient Name: [name] Date of Service: 04/29/2019 8:00 AM Medical Record Number: [id number] Patient Account Number: [id number] Date of Birth/Sex: 1950/11/21 (68 y.o. M) Treating RN: Primary Care Uziah Sorter: [name] Other Clinician: Referring Anny Sayler: [name] Treating Arretta Toenjes/Extender: [name], HOYT Weeks in Treatment: 39 Vital Signs Time Taken: 08:18 Temperature (F): 98.6 Height (in): 73 Pulse (bpm): 66 Weight (lbs): 210 Respiratory Rate (breaths/min): 16 Body Mass Index (BMI): 27.7 Blood Pressure (mmHg): 122/72 Reference Range: 80 - 120 mg / dl Electronic Signature(s) Signed: 04/29/2019 10:26:54 AM By: Lorine Bears RCP, RRT, CHT Entered By: Becky Sax, Amado Nash on 04/29/2019 [id number]

## 2019-04-29 NOTE — Progress Notes (Signed)
HENRIQUE, KIRNER ([id number]) Visit Report for 04/28/2019 Problem List Details Patient Name: [name], [name] Date of Service: 04/28/2019 8:00 AM Medical Record Number: [id number] Patient Account Number: [id number] Date of Birth/Sex: 10-Aug-1950 (68 y.o. 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, sequela I10 Essential (primary) hypertension Facility Procedures CPT4 Code:  [id number] Description: (Facility Use Only) HBOT, full body chamber, 97min Modifier: Quantity: 4 Physician Procedures CPT4 Code: [id number] Description: [id number] - WC PHYS HYPERBARIC OXYGEN THERAPY ICD-10 Diagnosis Description M86.68 Other chronic osteomyelitis, other site L89.314 Pressure ulcer of right buttock, stage 4 G82.20 Paraplegia, unspecified S34.109S Unspecified injury to unspecified  level of lumbar spinal cord, Modifier: sequela Quantity: 1 Electronic Signature(s) Signed: 04/28/2019 5:12:25 PM By: Worthy Keeler [name] Previous Signature: 04/28/2019 10:54:20 AM Version By: Lorine Bears RCP, RRT, CHT Previous Signature: 04/28/2019 5:11:53 PM Version By: Worthy Keeler [name] Entered By: Worthy Keeler on 04/28/2019 17:12:25

## 2019-04-29 NOTE — Progress Notes (Signed)
[name], [name] ([id number]) Visit Report for 04/28/2019 HBO Details Patient Name: [name], [name] Date of Service: 04/28/2019 8:00 AM Medical Record Number: [id number] Patient Account Number: [id number] Date of Birth/Sex: [birth year] (68 y.o. 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 HBO Treatment Details Treatment Number: 9 Patient Type: Outpatient Chamber Type: Monoplace Chamber Serial #: [id number] Treatment Protocol: 2.0 ATA with 90 minutes oxygen, and no air breaks Treatment Details Compression Rate Down: 1.5 psi / minute De-Compression Rate Up: 1.5 psi / minute Compress Tx Pressure Air breaks and breathing periods Decompress Decompress Begins Reached (leave unused spaces blank) Begins Ends Chamber Pressure (ATA) 1 2 - - - - - - 2 1 Clock Time (24 hr) 08:20 08:32 - - - - - - 10:02 10:12 Treatment Length: 112 (minutes) Treatment Segments: 4 Vital Signs Capillary Blood Glucose Reference Range: 80 - 120 mg / dl HBO Diabetic Blood Glucose Intervention Range: <131 mg/dl or >249 mg/dl Time Vitals Blood Respiratory Capillary Blood Glucose Pulse Action Type: Pulse: Temperature: Taken: Pressure: Rate: Glucose (mg/dl): Meter #: Oximetry (%) Taken: Pre 08:15 116/60 66 16 98.7 Treatment Response Treatment Toleration: Well Treatment Completion Treatment Completed without Adverse Event Status: HBO Attestation I certify that I supervised this HBO treatment in accordance with Medicare guidelines. A trained emergency response Yes team is readily available per hospital policies and procedures. Continue HBOT as  ordered. Yes Electronic Signature(s) Signed: 04/28/2019 5:12:20 PM By: Mike Craze ([id number]) Previous Signature: 04/28/2019 10:54:20 AM Version By: Lorine Bears RCP, RRT, CHT Previous Signature: 04/28/2019 5:11:53 PM Version By: Worthy Keeler [name] Entered By: Worthy Keeler on 04/28/2019 17:12:20 [name], [name] ([id number]) -------------------------------------------------------------------------------- HBO Safety Checklist Details Patient Name: [name] Date of Service: 04/28/2019 8:00 AM Medical Record Number: [id number] Patient Account Number: [id number] Date of Birth/Sex: 23-Jun-1951 (68 y.o. M) Treating RN: Primary Care October Peery: [name] Other Clinician: Referring Telissa Palmisano: [name] Treating Samamtha Tiegs/Extender: [name], HOYT Weeks in Treatment: 31 HBO Safety Checklist Items Safety Checklist Consent Form Signed Patient voided / foley secured and emptied When did you last eato morning Last dose of injectable or oral agent n/a Ostomy pouch emptied and vented if applicable NA Medtronic Baclofen Pump; Nevro All implantable devices assessed, documented and approved Spinal Stimulator Intravenous access site secured and place NA Valuables secured Linens and cotton and cotton/polyester blend (less than 51% polyester) Personal oil-based products / skin lotions / body lotions removed Wigs or hairpieces removed NA Smoking or tobacco materials removed NA Books / newspapers / magazines / loose paper removed Cologne, aftershave, perfume and deodorant removed Jewelry removed (may wrap wedding band) Make-up removed NA Hair care products removed Battery operated devices (external) removed Heating patches and chemical warmers removed Titanium eyewear removed NA Nail polish cured greater than 10 hours NA Casting material cured greater than 10 hours NA Hearing aids removed NA Loose dentures or  partials removed NA Prosthetics have been removed NA Patient demonstrates correct use of air break device (if applicable) Patient concerns have been addressed Patient grounding bracelet on and cord attached to chamber Specifics for Inpatients (complete in addition to above) Medication sheet sent with patient Intravenous medications needed or due during therapy sent  with patient Drainage tubes (e.g. nasogastric tube or chest tube secured and vented) Endotracheal or Tracheotomy tube secured Cuff deflated of air and inflated with saline Airway suctioned Electronic Signature(s) [name], [name] ([id number]) Signed: 04/28/2019 10:54:20 AM By: Lorine Bears RCP, RRT, CHT Entered By: Lorine Bears on 04/28/2019 08:35:41

## 2019-04-30 NOTE — Progress Notes (Signed)
MORTEN, LETO ([id number]) Visit Report for 04/29/2019 Problem List Details Patient Name: [name], [name] Date of Service: 04/29/2019 8:00 AM Medical Record Number: [id number] Patient Account Number: [id number] Date of Birth/Sex: September 22, 1950 (68 y.o. 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, sequela I10 Essential (primary) hypertension Facility Procedures CPT4 Code:  [id number] Description: (Facility Use Only) HBOT, full body chamber, 66min Modifier: Quantity: 4 Physician Procedures CPT4 Code: [id number] Description: [id number] - WC PHYS HYPERBARIC OXYGEN THERAPY ICD-10 Diagnosis Description M86.68 Other chronic osteomyelitis, other site L89.314 Pressure ulcer of right buttock, stage 4 G82.20 Paraplegia, unspecified S34.109S Unspecified injury to unspecified  level of lumbar spinal cord, Modifier: sequela Quantity: 1 Electronic Signature(s) Signed: 04/29/2019 5:05:18 PM By: Worthy Keeler [name] Previous Signature: 04/29/2019 10:26:54 AM Version By: Lorine Bears RCP, RRT, CHT Entered By: Worthy Keeler on 04/29/2019 17:05:17

## 2019-04-30 NOTE — Progress Notes (Signed)
TAIVION, ALVELO ([id number]) Visit Report for 04/29/2019 HBO Details Patient Name: DAKOATA, DIMITROV Date of Service: 04/29/2019 8:00 AM Medical Record Number: [id number] Patient Account Number: [id number] Date of Birth/Sex: 07/31/1950 (68 y.o. 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 HBO Treatment Details Treatment Number: 10 Patient Type: Outpatient Chamber Type: Monoplace Chamber Serial #: [id number] Treatment Protocol: 2.0 ATA with 90 minutes oxygen, and no air breaks Treatment Details Compression Rate Down: 1.5 psi / minute De-Compression Rate Up: 1.5 psi / minute Compress Tx Pressure Air breaks and breathing periods Decompress Decompress Begins Reached (leave unused spaces blank) Begins Ends Chamber Pressure (ATA) 1 2 - - - - - - 2 1 Clock Time (24 hr) 08:22 08:34 - - - - - - 10:04 10:14 Treatment Length: 112 (minutes) Treatment Segments: 4 Vital Signs Capillary Blood Glucose Reference Range: 80 - 120 mg / dl HBO Diabetic Blood Glucose Intervention Range: <131 mg/dl or >249 mg/dl Time Vitals Blood Respiratory Capillary Blood Glucose Pulse Action Type: Pulse: Temperature: Taken: Pressure: Rate: Glucose (mg/dl): Meter #: Oximetry (%) Taken: Pre 08:18 122/72 66 16 98.6 Post 10:16 116/60 60 16 98 Treatment Response Treatment Toleration: Well Treatment Completion Treatment Completed without Adverse Event Status: HBO Attestation I certify that I supervised this HBO treatment in accordance with Medicare guidelines. A trained emergency response Yes team is readily available per hospital policies and  procedures. Continue HBOT as ordered. Yes Electronic Signature(s) STONE, WATRING ([id number]) Signed: 04/29/2019 5:05:03 PM By: Worthy Keeler [name] Previous Signature: 04/29/2019 10:26:54 AM Version By: Lorine Bears RCP, RRT, CHT Entered By: Worthy Keeler on 04/29/2019 17:05:03 AMARIS, SHARP ([id number]) -------------------------------------------------------------------------------- HBO Safety Checklist Details Patient Name: [name] Date of Service: 04/29/2019 8:00 AM Medical Record Number: [id number] Patient Account Number: [id number] Date of Birth/Sex: August 20, 1950 (68 y.o. M) Treating RN: Primary Care Christian Treadway: [name] Other Clinician: Referring Demaris Bousquet: [name] Treating Colbie Danner/Extender: [name], HOYT Weeks in Treatment: 12 HBO Safety Checklist Items Safety Checklist Consent Form Signed Patient voided / foley secured and emptied When did you last eato morning Last dose of injectable or oral agent n/a Ostomy pouch emptied and vented if applicable NA Medtronic Baclofen Pump; Nevro All implantable devices assessed, documented and approved Spinal Stimulator Intravenous access site secured and place NA Valuables secured Linens and cotton and cotton/polyester blend (less than 51% polyester) Personal oil-based products / skin lotions / body lotions removed Wigs or hairpieces removed NA Smoking or tobacco materials removed NA Books / newspapers / magazines / loose paper removed Cologne, aftershave, perfume and deodorant removed Jewelry removed (may wrap wedding band) Make-up removed NA Hair care products removed Battery operated devices (external) removed Heating patches and chemical warmers removed Titanium eyewear removed NA Nail polish cured greater than 10 hours NA Casting material cured greater than 10 hours NA Hearing aids removed NA Loose dentures or partials removed NA Prosthetics have been  removed NA Patient demonstrates correct use of air break device (if applicable) Patient concerns have been addressed Patient grounding bracelet on and cord attached to chamber Specifics for Inpatients (complete in addition to above) Medication sheet sent with patient Intravenous medications needed or due during therapy sent with patient Drainage tubes (e.g.  nasogastric tube or chest tube secured and vented) Endotracheal or Tracheotomy tube secured Cuff deflated of air and inflated with saline Airway suctioned Electronic Signature(s) SHELDAN, HELDRETH ([id number]) Signed: 04/29/2019 10:26:54 AM By: Lorine Bears RCP, RRT, CHT Entered By: Becky Sax, Amado Nash on 04/29/2019 08:33:23

## 2019-05-02 ENCOUNTER — Encounter: Payer: Medicare Other | Admitting: Physician Assistant

## 2019-05-02 ENCOUNTER — Other Ambulatory Visit: Payer: Self-pay

## 2019-05-02 DIAGNOSIS — L89314 Pressure ulcer of right buttock, stage 4: Secondary | ICD-10-CM | POA: Diagnosis not present

## 2019-05-02 NOTE — Progress Notes (Signed)
Brett, [name] ([id number]) Visit Report for 05/02/2019 Arrival Information Details Patient Name: [name], [name] Date of Service: 05/02/2019 8:00 AM Medical Record Number: [id number] Patient Account Number: [id number] Date of Birth/Sex: [birth year] (68 y.o. M) Treating RN: Harold Barban Primary Care Evee Liska: [name] Other Clinician: [name] Referring Bricelyn Freestone: [name] Treating Grizelda Piscopo/Extender: [name], HOYT Weeks in Treatment: 44 Visit Information History Since Last Visit Added or deleted any medications: No Patient Arrived: Wheel Chair Any new allergies or adverse reactions: No Arrival Time: 08:21 Had a fall or experienced change in No Accompanied By: wife activities of daily living that may affect Transfer Assistance: Harrel Lemon Lift risk of falls: Patient Identification Verified: Yes Signs or symptoms of abuse/neglect since last visito No Secondary Verification Process Completed: Yes Hospitalized since last visit: No Patient Requires Transmission-Based No Implantable device outside of the clinic excluding No Precautions: cellular tissue based products placed in the center Patient Has Alerts: Yes since last visit: Patient Alerts: NOT Has Dressing in Place as Prescribed: Yes Diabetic Pain Present Now: No Electronic Signature(s) Signed: 05/02/2019 10:35:35 AM By: Lorine Bears RCP, RRT, CHT Entered By: Lorine Bears on 05/02/2019 08:23:26 AKBAR, SPARACO ([id number]) -------------------------------------------------------------------------------- Encounter Discharge Information Details Patient Name: [name] Date of Service: 05/02/2019 8:00 AM Medical Record Number: [id number] Patient Account Number: [id number] Date of Birth/Sex: 1951/05/09 (68 y.o. M) Treating RN: Harold Barban Primary Care Yara Tomkinson: [name] Other Clinician: [name] Referring Mats Jeanlouis: [name] Treating Joseh Sjogren/Extender: [name], HOYT Weeks in Treatment: 21 Encounter Discharge Information Items Discharge Condition: Stable Ambulatory Status: Wheelchair Discharge Destination: Home Transportation: Private Auto Accompanied By: wife Schedule Follow-up Appointment: Yes Clinical Summary of Care: Notes Patient has an HBO treatment scheduled on 05/03/2019 at 08:00 am. Electronic Signature(s) Signed: 05/02/2019 10:35:35 AM By: Lorine Bears RCP, RRT, CHT Entered By: Lorine Bears on 05/02/2019 10:35:02 DOW, REIBEL ([id number]) -------------------------------------------------------------------------------- Vitals Details Patient Name: [name] Date of Service: 05/02/2019 8:00 AM Medical Record Number: [id number] Patient Account Number: [id number] Date of Birth/Sex: 04/18/1951 (68 y.o. M) Treating RN: Harold Barban Primary Care Chayanne Filippi: [name] Other Clinician: [name] Referring Mileidy Atkin: [name] Treating Nikea Settle/Extender: [name], HOYT Weeks in Treatment: 74 Vital Signs Time Taken: 08:14 Temperature (F): 98.9 Height (in): 73 Pulse (bpm): 66 Weight (lbs): 210 Respiratory Rate (breaths/min): 16 Body Mass Index (BMI): 27.7 Blood Pressure (mmHg): 120/78 Reference Range: 80 - 120 mg / dl Electronic Signature(s) Signed: 05/02/2019 10:35:35 AM By: Lorine Bears RCP, RRT, CHT Entered By: Lorine Bears on 05/02/2019 08:24:00

## 2019-05-03 ENCOUNTER — Encounter: Payer: Medicare Other | Admitting: Physician Assistant

## 2019-05-03 DIAGNOSIS — L89314 Pressure ulcer of right buttock, stage 4: Secondary | ICD-10-CM | POA: Diagnosis not present

## 2019-05-03 NOTE — Progress Notes (Signed)
KENTLEY, BOGARDUS ([id number]) Visit Report for 05/02/2019 HBO Details Patient Name: SNAPPER, DECELLES Date of Service: 05/02/2019 8:00 AM Medical Record Number: [id number] Patient Account Number: [id number] Date of Birth/Sex: 1951-03-10 (68 y.o. 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 HBO Treatment Details Treatment Number: 11 Patient Type: Outpatient Chamber Type: Monoplace Chamber Serial #: [id number] Treatment Protocol: 2.0 ATA with 90 minutes oxygen, and no air breaks Treatment Details Compression Rate Down: 1.5 psi / minute De-Compression Rate Up: 1.5 psi / minute Compress Tx Pressure Air breaks and breathing periods Decompress Decompress Begins Reached (leave unused spaces blank) Begins Ends Chamber Pressure (ATA) 1 2 - - - - - - 2 1 Clock Time (24 hr) 08:18 08:30 - - - - - - 10:00 10:10 Treatment Length: 112 (minutes) Treatment Segments: 4 Vital Signs Capillary Blood Glucose Reference Range: 80 - 120 mg / dl HBO Diabetic Blood Glucose Intervention Range: <131 mg/dl or >249 mg/dl Time Vitals Blood Respiratory Capillary Blood Glucose Pulse Action Type: Pulse: Temperature: Taken: Pressure: Rate: Glucose (mg/dl): Meter #: Oximetry (%) Taken: Pre 08:14 120/78 66 16 98.9 Post 10:15 120/72 66 16 98.1 Treatment Response Treatment Toleration: Well Treatment Completion Treatment Completed without Adverse Event Status: HBO Attestation I certify that I supervised this HBO treatment in accordance with Medicare guidelines. A trained emergency response Yes team is readily  available per hospital policies and procedures. Continue HBOT as ordered. Yes Electronic Signature(s) IOAN, STANDBERRY ([id number]) Signed: 05/02/2019 5:19:01 PM By: Worthy Keeler [name] Previous Signature: 05/02/2019 10:35:35 AM Version By: Lorine Bears RCP, RRT, CHT Entered By: Worthy Keeler on 05/02/2019 17:19:01 RADEK, KEITA ([id number]) -------------------------------------------------------------------------------- HBO Safety Checklist Details Patient Name: [name] Date of Service: 05/02/2019 8:00 AM Medical Record Number: [id number] Patient Account Number: [id number] Date of Birth/Sex: 1950-11-11 (68 y.o. M) Treating RN: Harold Barban Primary Care Syretta Kochel: [name] Other Clinician: [name] Referring Javier Gell: [name] Treating Elberta Jon/Extender: [name], HOYT Weeks in Treatment: 51 HBO Safety Checklist Items Safety Checklist Consent Form Signed Patient voided / foley secured and emptied When did you last eato morning Last dose of injectable or oral agent n/a Ostomy pouch emptied and vented if applicable NA Medtronic Baclofen Pump; Nevro All implantable devices assessed, documented and approved Spinal Stimulator Intravenous access site secured and place NA Valuables secured Linens and cotton and cotton/polyester blend (less than 51% polyester) Personal oil-based products / skin lotions / body lotions removed Wigs or hairpieces removed NA Smoking or tobacco materials removed NA Books / newspapers / magazines / loose paper removed Cologne, aftershave, perfume and deodorant removed Jewelry removed (may wrap wedding band) Make-up removed NA Hair care products removed Battery operated devices (external) removed Heating patches and chemical warmers removed Titanium eyewear removed NA Nail polish cured greater than 10 hours NA Casting material cured greater than 10 hours NA Hearing aids  removed NA Loose dentures or partials removed NA Prosthetics have been removed NA Patient demonstrates correct use of air break device (if applicable) Patient concerns have been addressed Patient grounding bracelet on and cord attached to chamber Specifics for Inpatients (complete in addition to above) Medication sheet sent with patient Intravenous medications needed or due  during therapy sent with patient Drainage tubes (e.g. nasogastric tube or chest tube secured and vented) Endotracheal or Tracheotomy tube secured Cuff deflated of air and inflated with saline Airway suctioned Electronic Signature(s) RAVINDER, LAMKIN ([id number]) Signed: 05/02/2019 10:35:35 AM By: Lorine Bears RCP, RRT, CHT Entered By: Lorine Bears on 05/02/2019 08:26:19

## 2019-05-03 NOTE — Progress Notes (Signed)
LEUM, TOMAN ([id number]) Visit Report for 05/03/2019 Problem List Details Patient Name: [name], [name] Date of Service: 05/03/2019 8:00 AM Medical Record Number: [id number] Patient Account Number: [id number] Date of Birth/Sex: [birth year] (68 y.o. 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, sequela I10  Essential (primary) hypertension Facility Procedures CPT4 Code: [id number] Description: (Facility Use Only) HBOT, full body chamber, 56min Modifier: Quantity: 4 Physician Procedures CPT4 Code: [id number] Description: [id number] - WC PHYS HYPERBARIC OXYGEN THERAPY ICD-10 Diagnosis Description M86.68 Other chronic osteomyelitis, other site L89.314 Pressure ulcer of right buttock, stage 4 L03.317 Cellulitis of buttock S34.109S Unspecified injury to unspecified  level of lumbar spinal cord, Modifier: sequela Quantity: 1 Electronic Signature(s) Signed: 05/03/2019 2:07:25 PM By: Worthy Keeler [name] Previous Signature: 05/03/2019 12:02:54 PM Version By: Lorine Bears RCP, RRT, CHT Entered By: Worthy Keeler on 05/03/2019 14:07:24

## 2019-05-03 NOTE — Progress Notes (Signed)
STEEL, VAID ([id number]) Visit Report for 05/03/2019 Arrival Information Details Patient Name: OTHEL, KRENEK Date of Service: 05/03/2019 8:00 AM Medical Record Number: [id number] Patient Account Number: [id number] Date of Birth/Sex: 1950-10-05 (68 y.o. M) Treating RN: Montey Hora Primary Care Anikin Prosser: [name] Other Clinician: [name] Referring Mazey Mantell: [name] Treating Nelda Luckey/Extender: [name], HOYT Weeks in Treatment: 61 Visit Information History Since Last Visit Added or deleted any medications: No Patient Arrived: Wheel Chair Any new allergies or adverse reactions: No Arrival Time: 08:00 Had a fall or experienced change in No Accompanied By: wife activities of daily living that may affect Transfer Assistance: Harrel Lemon Lift risk of falls: Patient Identification Verified: Yes Signs or symptoms of abuse/neglect since last visito No Secondary Verification Process Completed: Yes Hospitalized since last visit: No Patient Requires Transmission-Based No Implantable device outside of the clinic excluding No Precautions: cellular tissue based products placed in the center Patient Has Alerts: Yes since last visit: Patient Alerts: NOT Has Dressing in Place as Prescribed: Yes Diabetic Pain Present Now: No Electronic Signature(s) Signed: 05/03/2019 12:02:54 PM By: Lorine Bears RCP, RRT, CHT Entered By: Lorine Bears on 05/03/2019 08:33:58 DEXTIN, MARCUS ([id number]) -------------------------------------------------------------------------------- Encounter Discharge Information Details Patient Name: [name] Date of Service: 05/03/2019 8:00 AM Medical Record Number: [id number] Patient Account Number: [id number] Date of Birth/Sex: [birth year] (68 y.o. M) Treating RN: Montey Hora Primary Care Derian Pfost: [name] Other Clinician: [name] Referring Paxtyn Wisdom: [name] Treating Levar Fayson/Extender: [name], HOYT Weeks in Treatment: 23 Encounter Discharge Information Items Discharge Condition: Stable Ambulatory Status: Wheelchair Discharge Destination: Home Transportation: Private Auto Accompanied By: wife Schedule Follow-up Appointment: Yes Clinical Summary of Care: Notes Appointment scheduled on 05/04/2019 at 08:00 am. Electronic Signature(s) Signed: 05/03/2019 12:02:54 PM By: Lorine Bears RCP, RRT, CHT Entered By: Lorine Bears on 05/03/2019 10:34:36 ANDRIK, MORETZ ([id number]) -------------------------------------------------------------------------------- Vitals Details Patient Name: [name] Date of Service: 05/03/2019 8:00 AM Medical Record Number: [id number] Patient Account Number: [id number] Date of Birth/Sex: [birth year] (68 y.o. M) Treating RN: Montey Hora Primary Care Darryl Willner: [name] Other Clinician: [name] Referring Caroleena Paolini: [name] Treating Damaso Laday/Extender: [name], HOYT Weeks in Treatment: 45 Vital Signs Time Taken: 08:15 Temperature (F): 98.9 Height (in): 73 Pulse (bpm): 66 Weight (lbs): 210 Respiratory Rate (breaths/min): 16 Body Mass Index (BMI): 27.7 Blood Pressure (mmHg): 118/62 Reference Range: 80 - 120 mg / dl Electronic Signature(s) Signed: 05/03/2019 12:02:54 PM By: Lorine Bears RCP, RRT, CHT Entered By: Lorine Bears on 05/03/2019 10:42:37

## 2019-05-03 NOTE — Progress Notes (Signed)
[name], [name] ([id number]) Visit Report for 05/02/2019 Problem List Details Patient Name: [name], [name] Date of Service: 05/02/2019 8:00 AM Medical Record Number: [id number] Patient Account Number: [id number] Date of Birth/Sex: 1950-09-28 (68 y.o. 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, sequela I10  Essential (primary) hypertension Facility Procedures CPT4 Code: [id number] Description: (Facility Use Only) HBOT, full body chamber, 55min Modifier: Quantity: 4 Physician Procedures CPT4 Code: [id number] Description: [id number] - WC PHYS HYPERBARIC OXYGEN THERAPY ICD-10 Diagnosis Description M86.68 Other chronic osteomyelitis, other site L89.314 Pressure ulcer of right buttock, stage 4 G82.20 Paraplegia, unspecified S34.109S Unspecified injury to unspecified  level of lumbar spinal cord, Modifier: sequela Quantity: 1 Electronic Signature(s) Signed: 05/02/2019 5:19:12 PM By: Worthy Keeler [name] Previous Signature: 05/02/2019 10:35:35 AM Version By: Lorine Bears RCP, RRT, CHT Entered By: Worthy Keeler on 05/02/2019 17:19:11

## 2019-05-03 NOTE — Progress Notes (Addendum)
BRENEN, HAUPTMANN ([id number]) Visit Report for 05/03/2019 Chief Complaint Document Details Patient Name: [name], [name] Date of Service: 05/03/2019 10:30 AM Medical Record Number: [id number] Patient Account Number: [id number] Date of Birth/Sex: [birth year] (68 y.o. M) Treating RN: Montey Hora Primary Care Provider: [name] Other Clinician: Referring Provider: [name] Treating Provider/Extender: [name], HOYT Weeks in Treatment: 37 Information Obtained from: Patient Chief Complaint Right gluteal fold ulcer Electronic Signature(s) Signed: 05/03/2019 10:25:53 AM By: Worthy Keeler [name] Entered By: Worthy Keeler on 05/03/2019 10:25:52 [name], [name] ([id number]) -------------------------------------------------------------------------------- HPI Details Patient Name: [name] Date of Service: 05/03/2019 10:30 AM Medical Record Number: [id number] Patient Account Number: [id number] Date of Birth/Sex: 1951-03-28 (68 y.o. 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 He states that there is no history of diabetes that he is aware of. He does have issues with his prostate and is currently receiving treatment for this by way of oral medication. With that being said I do not have a lot of details in that regard. Nonetheless the patient presents today as a result of having been referred to [place] by another provider initially home health was set to come out and take care of his wound although due to the fact  that he apparently drives he's not able to receive home health. His wife is therefore trying to help take care of this wound within although they have been struggling with what exactly to do at this point. She states that she can do some things but she is definitely not a nurse and does have some issues with looking at blood. The good news is the wound does not appear to be too deep and is fairly superficial at this point. There is no slough noted there is some nonviable skin noted around the surface of the wound and the perimeter at this point. The central portion of the wound appears to be very good with a dermal layer noted this does not appear to be again deep enough to extend it to subcutaneous tissue at this point. 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 In the pump he has Dilaudid and baclofen. 12/07/17 on evaluation today patient presents for follow-up concerning his ongoing lower back thigh ulcer on the right. He states that he did not get the supplies ordered and therefore has not really been able to perform the dressing changes as directed exactly. His wife was able to get some Boarder Foam Dressing's from the drugstore and subsequently has been using hydrogel which did help to a degree in the wound does appear to be able smaller. There is actually more drainage this week noted than previous. 12/21/17 on evaluation today patient appears to be doing rather well in regard to his right gluteal ulcer. He has been tolerating the dressing changes without complication. There does not appear to be any evidence of infection at this point in time. Overall the wound does seem to be making some progress as far as the edges  are concerned there's not as much in the way of overlapping of the external wound edges and he has a good epithelium to wound bed border for the most part. This however is not  true right at the 12 o'clock location over the span of a little over a centimeters which actually will require debridement today to clean this away and hopefully allow it to continue to heal more appropriately. 12/28/17 on evaluation today patient appears to be doing rather well in regard to his ulcer in the left gluteal region. He's been tolerating the dressing changes without complication. Apparently he has had some difficulty getting his dressing material. Apparently there's been some confusion with ordering we're gonna check into this. Nonetheless overall he's been showing signs of improvement which is good news. Debridement is not required today. 01/04/18 on evaluation today patient presents for follow-up concerning his right gluteal ulcer. He has been tolerating the dressing changes fairly well. On inspection today it appears he may actually have some maceration them concerned about the fact that he may be developing too much moisture in and around the wound bed which can cause delay in healing. With that being said he unfortunately really has not showed significant signs of improvement since last week's evaluation in fact this may even be just the little bit/slightly larger. Nonetheless he's been having a lot of discomfort I'm not sure this is even related to the wound as he has no pain when I'm to breeding or otherwise cleaning the wound during evaluation today. Nonetheless this is something that we did recommend he talked to his pain specialist concerning. 01/11/18 on evaluation today patient appears to be doing better in regard to his ulceration. He has been tolerating the dressing changes without complication. With that being said overall there's no evidence of infection which is good news. The only thing is he did receive the hatch affair blue classic versus the ready nonetheless I feel like this is perfectly fine and appears to have done well for him over the past week. 01/25/18 on  evaluation today patient's wound actually appears to be a little bit larger than during the last evaluation. The good [name], [name]. ([id number]) news is the majority of the wound edges actually appear to be fairly firmly attached to the wound bed unfortunately again we're not really making progress in regard to the size. Roughly the wound is about the same size as when I first saw him although again the wound margin/edges appear to be much better. 02/01/18 on evaluation today patient actually appears to be doing very well in regard to his wound. Applying the Prisma dry does seem to be better although he does still have issues with slow progression of the wound. There was a slight improvement compared to last week's measurements today. Nonetheless I have been considering other options as far as the possibility of Theraskin or even a snap vac. In general I'm not sure that the Theraskin due to location of the wound would be a very good idea. Nonetheless I do think that a snap vac could be a possibility for the patient and in fact I think this could even be an excellent way to manage the wound possibly seeing some improvement in a very rapid fashion here. Nonetheless this is something that we would need to get approved and I did have a lengthy conversation with the patient about this today. 02/08/18 on evaluation today patient appears to be doing a little better in regard to his ulcer.  He has been tolerating the dressing changes without complication. Fortunately despite the fact that the wound is a little bit smaller it's not significantly so unfortunately. We have discussed the possibility of a snap vac we did check with insurance this is actually covered at this point. Fortunately there does not appear to be any sign of infection. Overall I'm fairly pleased with how things seem to be appearing at this point. 02/15/18 on evaluation today patient appears to be doing rather well in regard to his right  gluteal ulcer. Unfortunately the snap vac did not stay in place with his sheer and friction this came loose and did not seem to maintain seal very well. He worked for about two days and it did seem to do very well during that time according to his wife but in general this does not seem to be something that's gonna be beneficial for him long-term. I do believe we need to go back to standard dressings to see if we can find something that will be of benefit. 03/02/18- He is here in follow up evaluation; there is minimal change in the wound. He will continue with the same treatment plan, would consider changing to iodosrob/iodoflex if ulcer continues to to plateau. He will follow up next week 03/08/18 on evaluation today patient's wound actually appears to be about the same size as when I previously saw him several weeks back. Unfortunately he does have some slightly dark discoloration in the central portion of the wound which has me concerned about pressure injury. I do believe he may be sitting for too long a period of time in fact he tells me that "I probably sit for much too long". He does have some Slough noted on the surface of the wound and again as far as the size of the wound is concerned I'm really not seeing anything that seems to have improved significantly. 03/15/18 on evaluation today patient appears to be doing fairly well in regard to his ulcer. The wound measured pretty much about the same today compared to last week's evaluation when looking at his graph. With that being said the area of bruising/deep tissue injury that was noted last week I do not see at this point. He did get a new cushion fortunately this does seem to be have been of benefit in my pinion. It does appear that he's been off of this more which is good news as well I think that is definitely showing in the overall wound measurements. With that being said I do believe that he needs to continue to offload I don't think that  the fact this is doing better should be or is going to allow him to not have to offload and explain this to him as well. Overall he seems to be in agreement the plan I think he understands. The overall appearance of the wound bed is improved compared to last week I think the Iodoflex has been beneficial in that regard. 03/29/18 on evaluation today patient actually appears to be doing rather well in regard to his wound from the overall appearance standpoint he does have some granulation although there's some Slough on the surface of the wound noted as well. With that being said he unfortunately has not improved in regard to the overall measurement of the wound in volume or in size. I did have a discussion with him very specifically about offloading today. He actually does work although he mainly is just sitting throughout the day. He tells me  he offloads by "lifting himself up for 30 seconds off of his chair occasionally" purchase from [employer] which does seem to have helped. And he has a new cushion that he with that being said he's also able to stand some for a very short period of time but not significant enough I think to provide appropriate offloading. I think the biggest issue at this point with the wound and the fact is not healing as quickly as we would like is due to the fact that he is really not able to appropriately offload while at work. He states the beginning after his injury he actually had a bed at his job that he could lay on in order to offload and that does seem to have been of help back at that time. Nonetheless he had not done this in quite some time unfortunately. I think that could be helpful for him this is something I would like for him to look into. 04/05/18 on evaluation today patient actually presents for follow-up concerning his right gluteal ulcer. Again he really is not significantly improved even compared to last week. He has been tolerating the dressing changes  without complication. With that being said fortunately there appears to be no evidence of infection at this time. He has been more proactive in trying to offload. [name], [name] ([id number]) 04/12/18 on evaluation today patient actually appears to be doing a little better in regard to his wound and the right gluteal fold region. He's been tolerating the dressing changes since removing the oasis without complication. However he was having a lot of burning initially with the oasis in place. He's unsure of exactly why this was given so much discomfort but he assumes that it was the oasis itself causing the problem. Nonetheless this had to be removed after about three days in place although even those three days seem to have made a fairly good improvement in regard to the overall appearance of the wound bed. In fact is the first time that he's made any improvement from the standpoint of measurements in about six weeks. He continues to have no discomfort over the area of the wound itself which leads me to wonder why he was having the burning with the oasis when he does not even feel the actual debridement's themselves. I am somewhat perplexed by this. 04/19/18 on evaluation today patient's wound actually appears to be showing signs of epithelialization around the edge of the wound and in general actually appears to be doing better which is good news. He did have the same burning after about three days with applying the Endoform last week in the same fashion that I would generally apply a skin substitute. This seems to indicate that it's not the oasis to cause the problem but potentially the moisture buildup that just causes things to burn or there may be some other reaction with the skin prep or Steri-Strips. Nonetheless I'm not sure that is gonna be able to tolerate any skin substitute for a long period of time. The good news is the wound actually appears to be doing better today compared to last  week and does seem to finally be making some progress. 04/26/18 on evaluation today patient actually appears to be doing rather well in regard to his ulcer in the right gluteal fold. He has been tolerating the dressing changes without complication which is good news. The Endoform does seem to be helping although he was a little bit more macerated this week. This  seems to be an ongoing issue with fluid control at this point. Nonetheless I think we may be able to add something like Drawtex to help control the drainage. 05/03/18 on evaluation today patient appears to actually be doing better in regard to the overall appearance of his wound. He has been tolerating the dressing changes without complication. Fortunately there appears to be no evidence of infection at this time. I really feel like his wound has shown signs as of today of turning around last week I thought so as well and definitely he could be seen in this week's overall appearance and measurements. In general I'm very pleased with the fact that he finally seems to be making a steady but sure progress. The patient likewise is very pleased. 05/17/18 on evaluation today patient appears to be doing more poorly unfortunately in regard to his ulcer. He has been tolerating the dressing changes without complication. With that being said he tells me that in the past couple of days he and his wife have noticed that we did not seem to be doing quite as well is getting dark near the center. Subsequently upon evaluation today the wound actually does appear to be doing worse compared to previous. He has been tolerating the dressing changes otherwise and he states that he is not been sitting up anymore than he was in the past from what he tells me. Still he has continued to work he states "I'm tired of dealing with this and if I have to just go home and lay in the bed all the time that's what I'll do". Nonetheless I am concerned about the fact that this  wound does appear to be deeper than what it was previous. 05/24/18 upon evaluation today patient actually presents after having been in the hospital due to what was presumed to be sepsis secondary to the wound infection. He had an elevated white blood cell count between 14 and 15. With that being said he does seem to be doing somewhat better now. His wound still is giving him some trouble nonetheless and he is obviously concerned about the fact likely talked about that this does seem to go more deeply than previously noted. I did review his wound culture which showed evidence of Staphylococcus aureus him and group B strep. Nonetheless he is on antibiotics, Levaquin, for this. Subsequently I did review his intake summary from the hospital as well. I also did look at the CT of the lumbar spine with contrast that was performed which showed no bone destruction to suggest lumbar disguises/osteomyelitis or sacral osteomyelitis. There was no paraspinal abscess. Nonetheless it appears this may have been more of just a soft tissue infection at this point which is good news. He still is nonetheless concerned about the wound which again I think is completely reasonable considering everything he's been through recently. 05/31/18 on evaluation today on evaluation today patient actually appears to be showing signs of his wound be a little bit deeper than what I would like to see. Fortunately he does not show any signs of significant infection although his temperature was 99 today he states he's been checking this at home and has not been elevated. Nonetheless with the undermining that I'm seeing at this point I am becoming more concerned about the wound I do think that offloading is a key factor here that is preventing the speedy recovery at this point. There does not appear to be any evidence of again over infection noted. He's been using  Santyl currently. 06/07/18 the patient presents today for follow-up  evaluation regarding the left ulcer in the gluteal region. He has been tolerating the Wound VAC fairly well. He is obviously very frustrated with this he states that to mean is really getting in his way. There does not appear to be any evidence of infection at this time he does have a little bit of odor I do not necessarily associate this with infection just something that we sometimes notice with Wound VAC therapy. With that being said I can definitely catch a tone of discontentment overall in the patient's demeanor today. This when he was previously in the hospital [name], [name]. ([id number]) an CT scan was done of the lumbar region which did not reveal any signs of osteomyelitis. With that being said the pelvis in particular was not evaluated distinctly which means he could still have some osteonecrosis I. Nonetheless the Wound VAC was started on Thursday I do want to get this little bit more time before jumping to a CT scan of the pelvis although that is something that I might would recommend if were not see an improvement by that time. 06/14/18 on evaluation today patient actually appears to be doing about the same in regard to his right gluteal ulcer. Again he did have a CT scan of the lumbar spine unfortunately this did not include the pelvis. Nonetheless with the depth of the wound that I'm seeing today even despite the fact that I'm not seeing any evidence of overt cellulitis I believe there's a good chance that we may be dealing with osteomyelitis somewhere in the right Ischial region. No fevers, chills, nausea, or vomiting noted at this time. 06/21/18 on evaluation today patient actually appears to be doing about the same with regard to his wound. The tunnel at 6 o'clock really does not appear to be any deeper although it is a little bit wider. I think at this point you may want to start packing this with white phone. Unfortunately I have not got approval for the CT scan of the pelvis  as of yet due to the fact that Medicare apparently has been denied it due to the diagnosis codes not being appropriate according to Medicare for the test requested. With that being said the patient cannot have an MRI and therefore this is the only option that we have as far as testing is concerned. The patient has had infection and was on antibiotics and been added code for cellulitis of the bottom to see if this will be appropriate for getting the test approved. Nonetheless I'm concerned about the infection have been spread deeper into the Ischial region. 06/28/18 on evaluation today patient actually appears to be doing rather well all things considered in regard to the right gluteal ulcer. He has been tolerating the dressing changes without complication. With that being said the Wound VAC he states does have to be replaced almost every day or at least reinforced unfortunately. Patient actually has his CT scan later this morning we should have the results by tomorrow. 07/05/18 on evaluation today patient presents for follow-up concerning his right Ischial ulcer. He did see the surgeon Dr. Lysle Pearl last week. They were actually very happy with him and felt like he spent a tremendous amount of time with them as far as discussing his situation was concerned. In the end Dr. Lysle Pearl did contact me as well and determine that he would not recommend any surgical intervention at this point as he felt like  it would not be in the patient's best interest based on what he was seeing. He recommended a referral to infectious disease. Subsequently this is something that Dr. Ines Bloomer office is working on setting up for the patient. As far as evaluation today is concerned the patient's wound actually appears to be worse at this point. I am concerned about how things are progressing and specifically about infection. I do not feel like it's the deeper but the area of depth is definitely widening which does have me concerned.  No fevers, chills, nausea, or vomiting noted at this time. I think that we do need initiate antibiotic therapy the patient has an allow allergy to amoxicillin/penicillin he states that he gets a rash since childhood. Nonetheless she's never had the issues with Catholics or cephalosporins in general but he is aware of. 07/27/18 on evaluation today patient presents following admission to the hospital on 07/09/18. He was subsequently discharged on 07/20/18. On 07/15/18 the patient underwent irrigation and debridement was soft tissue biopsy and bone biopsy as well as placement of a Wound VAC in the OR by Dr. Celine Ahr. During the hospital course the patient was placed on a Wound VAC and recommended follow up with surgery in three weeks actually with Dr. Delaine Lame who is infectious disease. The patient was on vancomycin during the hospital course. 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 He also had a bone culture which revealed evidence of methicillin-resistant staph aureus. He is updated CT scan 07/09/18 reveals that he had progression of the which was performed on wound to breakdown down to the trochanter where he actually had irregularities there as well suggestive of osteomyelitis. This was a change just since 9 December when we last performed a CT scan. Obviously this one had gone downhill quite significantly and rapidly. At this point upon evaluation I feel like in general the patient's wound seems to be doing fairly well all things considered upon my evaluation today. Obviously this is larger and deeper than what I previously evaluated but at the same time he seems to be making some progress as far as the appearance of the granulation tissue is concerned. I'm happy in that regard. No fevers, chills, nausea, or vomiting noted at this time. He is on IV vancomycin and Rocephin at the facility. He is currently in [employer]. 08/03/18 upon evaluation today  patient's wound appears to be doing better in regard to the overall appearance at this point in time. Fortunately he's been tolerating the Wound VAC without complication and states that the facility has been taking excellent care of the wound site. Overall I see some Slough noted on the surface which I am going to attempt sharp debridement today of but nonetheless other than this I feel like he's making progress. 08/09/18 on evaluation today patient's wound appears to be doing much better compared to even last week's evaluation. Do believe that the Wound VAC is been of great benefit for him. He has been tolerating the dressing changes that is the Wound [name], [name]. ([id number]) VAC without any complication and he has excellent granulation noted currently. There is no need for sharp debridement at this point. 08/16/18 on evaluation today patient actually appears to be doing very well in regard to the wound in the right gluteal fold region. This is showing signs of progress and again appears to be very healthy which is excellent news. Fortunately there is no sign of active infection by way of odor  or drainage at this point. Overall I'm very pleased with how things stand. He seems to be tolerating the Wound VAC without complication. 08/23/18 on evaluation today patient actually appears to be doing better in regard to his wound. He has been tolerating the Wound VAC without complication and in fact it has been collecting a significant amount of drainage which I think is good news especially considering how the wound appears. Fortunately there is no signs of infection at this time definitely nothing appears to be worse which is good news. He has not been started on the Bactrim and Flagyl that was recommended by Dr. Delaine Lame yet. I did actually contact her office this morning in order to check and see were things are that regard their gonna be calling me back. 08/30/18 on evaluation today patient  actually appears to show signs of excellent improvement today compared to last evaluation. The undermining is getting much better the wound seems to be feeling quite nicely and I'm very pleased that the granulation in general. With that being said overall I feel like the patient has made excellent progress which is great news. No fevers, chills, nausea, or vomiting noted at this time. 09/06/18 on evaluation today patient actually appears to be doing rather well in regard to his right gluteal ulcer. This is showing signs of improvement in overall I'm very pleased with how things seem to be progressing. The patient likewise is please. Overall I see no evidence of infection he is about to complete his oral antibiotic regimen which is the end of the antibiotics for him in just about three days. 09/13/18 on evaluation today patient's right Ischial ulcer appears to be showing signs of continued improvement which is excellent news. He's been tolerating the dressing changes without complication. Fortunately there's no signs of infection and the wound that seems to be doing very well. 09/28/18 on evaluation today patient appears to be doing rather well in regard to his right Ischial ulcer. He's been tolerating the Wound VAC without complication he knows there's much less drainage than there used to be this obviously is not a bad thing in my pinion. There's no evidence of infection despite the fact is but nothing about it now for several weeks. 10/04/18 on evaluation today patient appears to be doing better in regard to his right Ischial wound. He has been tolerating the Wound VAC without complication and I do believe that the silver nitrate last week was beneficial for him. Fortunately overall there's no evidence of active infection at this time which is great news. No fevers, chills, nausea, or vomiting noted at this time. 10/11/18 on evaluation today patient actually appears to be doing rather well in regard  to his Ischial ulcer. He's been tolerating the Wound VAC still without complication I feel like this is doing a good job. No fevers, chills, nausea, or vomiting noted at this time. 11/01/18 on evaluation today patient presents after having not been seen in our clinic for several weeks secondary to the fact that he was on evaluation today patient presents after having not been seen in our clinic for several weeks secondary to the fact that he was in a skilled nursing facility which was on lockdown currently due to the covert 19 national emergency. Subsequently he was discharged from the facility on this past Friday and subsequently made an appointment to come in to see yesterday. Fortunately there's no signs of active infection at this time which is good news and overall he does  seem to have made progress since I last saw. Overall I feel like things are progressing quite nicely. The patient is having no pain. 11/08/18 on evaluation today patient appears to be doing okay in regard to his right gluteal ulcer. He has been utilizing a Wound VAC home health this changing this at this point since he's home from the skilled nursing facility. Fortunately there's no signs of obvious active infection at this time. Unfortunately though there's no obvious active infection he is having some maceration and his wife states that when the sheets of the Wound VAC office on Sunday when it broke seal that he ended up having significant issues with some smell as well there concerned about the possibility of infection. Fortunately there's No fevers, chills, nausea, or vomiting noted at this time. 11/15/18 on evaluation today patient actually appears to be doing well in regard to his right gluteal ulcer. He has been tolerating the dressing changes without complication. Specifically the Wound VAC has been utilized up to this point. Fortunately there's no signs of infection and overall I feel like he has made progress even  since last week when I last saw him. I'm actually fairly happy with the overall appearance although he does seem to have somewhat of a hyper granular Dorion, Rodric J. ([id number]) overgrowth in the central portion of the wound which I think may require some sharp debridement to try flatness out possibly utilizing chemical cauterization following. 11/23/18 on evaluation today patient actually appears to be doing very well in regard to his sacral ulcer. He seems to be showing signs of improvement with good granulation. With that being said he still has the small area of hyper granulation right in the central portion of the wound which I'm gonna likely utilize silver nitrate on today. Subsequently he also keeps having a leak at the 6 o'clock location which is unfortunate we may be able to help out with some suggestions to try to prevent this going forward. Fortunately there's no signs of active infection at this time. 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 He's been tolerating the dressing changes without complication. Fortunately there's no signs of active infection at this time. I've been rather pleased with how things have progressed there still some evidence of pressure getting to the area with some redness right around the immediate wound opening. Nonetheless other than this I'm not seeing any significant complications or issues the wound is somewhat hyper granular. Upon discussing with the patient and his wife today I'm not sure that the wound is being packed to the base with the foam at this point. And if it's not been packed fully that may be part of the reason why is not seen as much improvement as far as the granulation from the base out. Again we do not want pack too tightly but we need some of the firm to get to the base of the wound. I discussed this with patient and his wife  today. 12/06/18 on evaluation today patient appears to be doing well in regard to his right gluteal pressure ulcer. He's been tolerating the dressing changes without complication. Fortunately there's no signs of active infection. He still has some hyper granular tissue and I do think it would be appropriate to continue with the chemical cauterization as of today. 12/16/18 on evaluation today patient actually appears to be doing okay in regard to his right  gluteal ulcer. He is been tolerating the dressing changes without complication including the Wound VAC. Overall I feel like nothing seems to be worsening I do feel like that the hyper granulation buds in the central portion of the wound have improved to some degree with the silver nitrate. We will have to see how things continue to progress. 12/20/18 on evaluation today patient actually appears to be doing much worse in my pinion even compared to last week's evaluation. Unfortunately as opposed to showing any signs of improvement the areas of hyper granular tissue in the central portion of the wound seem to be getting worse. Subsequently the wound bed itself also seems to be getting deeper even compared to last week which is both unusual as well as concerning since prior he had been shown signs of improvement. Nonetheless I think that the issue could be that he's actually having some difficulty in issues with a deeper infection. There's no external signs of infection but nonetheless I am more worried about the internal, osteomyelitis, that could be restarting. He has not been on antibiotics for some time at this point. I think that it may be a good idea to go ahead and started back on an antibiotic therapy while we wait to see what the testing shows. 12/27/18 on evaluation today patient presents for follow-up concerning his left gluteal fold wound. Fortunately he appears to be doing well today. I did review the CT scan which was negative for any signs of  osteomyelitis or acute abnormality this is excellent news. Overall I feel like the surface of the wound bed appears to be doing significantly better today compared to previously noted findings. There does not appear any signs of infection nor does he have any pain at this time. 01/03/19 on evaluation today patient actually appears to be doing quite well in regard to his ulcer. Post debridement last week he really did not have too much bleeding which is good news. Fortunately today this seems to be doing some better but we still has some of the hyper granular tissue noted in the base of the wound which is gonna require sharp debridement today as well. Overall I'm pleased with how things seem to be progressing since we switched away from the Wound VAC I think he is making some progress. 01/10/19 on evaluation today patient appears to be doing better in regard to his right gluteal fold ulcer. He has been tolerating the dressing changes without complication. The debridement to seem to be helping with current away some of the poor hyper granular tissue bugs throughout the region of his gluteal fold wound. He's been tolerating the dressing changes otherwise without complication which is great news. No fevers, chills, nausea, or vomiting noted at this time. 01/17/19 on evaluation today patient actually appears to be doing excellent in regard to his wound. He's been tolerating the dressing changes without complication. Fortunately there is no signs of active infection at this time which is great news. No fevers, chills, nausea, or vomiting noted at this time. 01/24/19 on evaluation today patient actually appears to be doing quite well with regard to his ulcer. He has been tolerating the dressing changes without complication. Fortunately there's no signs of active infection at this time. Overall been very pleased with the progress that he seems to be making currently. 01/31/19-Patient returns at 1 week with  apparent similarity in dimensions to the wound, with no signs of infection, he has been [place] ([id number]) changing dressings twice  a day 02/08/19 upon evaluation today patient actually appears to be doing well with regard to his right Ischial ulcer. The wound is not appear to be quite as deep and seems to be making progress which is good news. With that being said I'm still reluctant to go back to the Wound VAC at this point. He's been having to change the dressings twice a day which is a little bit much in my pinion from the wound care supplies standpoint. I think that possibly attempting to utilize extras orbit may be beneficial this may also help to prevent any additional breakdown secondary to fluid retention in the wound itself. The patient is in agreement with giving this a try. 02/15/19 on evaluation today patient actually appears to be doing decently well with regard to his ulcer in the right to gluteal fold location. He's been tolerating the dressing changes without complication. Fortunately there is no signs of active infection at this time. He is able to keep the current dressing in place more effectively for a day at a time whereas before he was having a changes to to three times a day. The actions or has been helpful in this regard. Fortunately there's no signs of anything getting worse and I do feel like he showing signs of good improvement with regard to the wound bed status. 02/22/2019 patient appears to be doing very well today with regard to his ulcer in the gluteal fold. Fortunately there is no signs of active infection and he has been tolerating the dressing changes without any complication. Overall extremely pleased with how things seem to be progressing. He has much less of the hyper granular projections within the wound these have slowly been debrided away and he seems to be doing well. The wound bed is more uniform. 03/01/19 on evaluation today patient appears to  be doing unfortunately about the same in regard to his gluteal ulcer. He's been tolerating the dressing changes without complication. Fortunately there's no signs of active infection at this time. With that being said he continues to develop these hyper granular projections which I'm unsure of exactly what they are and why they are rising. Nonetheless I explained to the patient that I do believe it would be a good idea for [place] to stand a biopsy sample for pathology to see if that can shed any light on what exactly may be going on here. Fortunately I do not see any obvious signs of infection. With that being said the patient has had a little bit more drainage this week apparently compared to last week. 03/08/2019 on evaluation today patient actually appears to look somewhat better with regard to the appearance of his wound bed at this time. This is good news. Overall I am very pleased with how things seem to have progressed just in the past week with a switch to the [facility] dressing. I think that has been beneficial for him. With that being said at this time the patient is concerned about his biopsy that I sent off last week unfortunately I do not have that report as of yet. Nonetheless we have called to obtain this and hopefully will hear back from the lab later this morning. 03/15/19 on evaluation today patient's wound actually appears to be doing okay today with regard to the overall appearance of the wound bed. He has been tolerating the dressing changes without complication he still has hyper granular tissue noted but fortunately that seems to be minimal at this point compared  to some of what we've seen in the past. Nonetheless I do think that he is still having some issues currently with some of this type of granulation the biopsy and since all showed nothing more than just evidence of granulation tissue. Therefore there really is nothing different to initiate or do at this point. 03/24/19 on  evaluation today patient appears to be doing a little better with regard to his ulcer. He's been tolerating the dressing changes without complication. Fortunately there is no signs of active infection at this time. No fevers, chills, nausea, or vomiting noted at this time. I'm overall pleased with how things seem to be progressing. 03/29/2019 on evaluation today patient appears to be doing about the same in regard to his ulcer in the right gluteal fold. Unfortunately he is not seeming to make a lot of progress and the wound is somewhat stalled. There is no signs of active infection externally but I am concerned about the possibility of infection continuing in the ischial location which previously he did have infection noted. Again is not able to have an MRI so probably our best option for testing for this would be a triple phase bone scan which will detect subtle changes in the bone more so than plain film x-rays. In the past they really have not been beneficial and in fact the CT scans even have been somewhat questionable at times. Nonetheless there is no signs of systemic infection which is at least good news but again his wound is not healing at all on the predicted schedule. 04/05/19 on evaluation today patient appears to be doing well all things considering with regard to his wound and the right gluteal fold. He actually has his triple bone scan scheduled for sometime in the next couple of days. With that being said I've also been looking to other possibilities of what could be causing hyper granular tissue were looking into the bone scan again for evaluating for the risk or possibility of infection deeper and I'm also gonna go ahead and see about obtaining a deep tissue culture today to send and see if there's any evidence of infection noted on culture. He's in agreement with that plan. 04/12/2019 on evaluation today patient presents for reevaluation here in our clinic concerning ongoing issues with  his right gluteal fold ulcer. 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 It was discussed with him at that point that I think it would be appropriate [name], [name]. ([id number]) for him to proceed with hyperbaric oxygen therapy especially in light of the fact that he is previously been on IV antibiotics at the beginning of the year for close to 3 months followed by several weeks of oral antibiotics that was all prescribed by infectious disease. He had surgical debridement around Christmas December 2019 due to an abscess and osteomyelitis of the ischial bone. Unfortunately this has not really proceeded as well as we would have liked and again we did a CT scan even a couple months ago as he cannot have an MRI secondary to having issues with both a pain pump as well as a spinal cord stimulator which prevent him from going into an MRI machine. With that being said there were chronic changes noted at the ischial tuberosity which had progressed since December 2019 there was no evidence of fluid collection on that initial CT scan. With that being said  that was on January 21, 2019. I am not sure that it was a sensitive enough test as compared to an MRI and then subsequently I ordered a bone scan for him which was actually completed on 03/29/2019 and this revealed that he does indeed have positive osteomyelitis involving the right ischial tuberosity. This is adjacent to the ulcer and I think is the reason that his ulcer is not healing. Subsequently I am in a place him back on oral antibiotics today unfortunately his wound culture showed just mixed gram-negative flora with no specific findings of a predominant organism. Nonetheless being with the location I think a good broad-spectrum antibiotic for gram-negative's is a good choice at this point he is previously taken Levaquin without  significant issues and I think that is an appropriate antibiotic for him at this time. 04/19/2019 on evaluation today patient actually appears to be doing fairly well with regard to his wound. He has been tolerating the dressing changes without complication. Fortunately there is no signs of active infection and he has been taking the oral antibiotics at this time. Subsequently we did make a referral to infectious disease although Dr. Steva Ready wants the patient to be seen by general surgery first in order apparently to see if there is anything from a surgical standpoint that should be done prior to initiation of IV antibiotic therapy. Again the patient is okay with actually seeing Dr. Celine Ahr whom he has seen before we will make that referral. 04/26/2019 on evaluation today patient actually appears to be doing well with regard to his wound. He has been tolerating the dressing changes without complication. Fortunately there is no signs of active infection at this time. I do believe that the hyperbaric oxygen therapy along with the antibiotics which I prescribed at this point have been doing well for him. With that being said he has not seen Dr. Celine Ahr the surgeon yet in fact they have not been contacted for scheduling an appointment as of yet. Subsequently the patient is not on antibiotics currently by IV Dr. Steva Ready did want him to see Dr. Celine Ahr first which we are working on trying to get scheduled. We again give the information to the patient today for Dr. Celine Ahr and her number as far as contacting their office to see about getting something scheduled. Again were looking for whether or not they recommend any surgical intervention. 05/03/2019 on evaluation today patient appears to be doing about the same with regard to his wound there may be a little bit of filling in in regard to the base of the wound but still he has quite a bit of hyper granular tissue that I am seeing today. I believe that he  may may need a more aggressive sharp debridement possibly even by the surgeon or under anesthesia in order to clear away some of the hyper granular material in order to help allow for appropriate granulation to fill in. We have made a referral to Dr. Celine Ahr unfortunately it sounds as if they did not receive the referral we contacted them today they should be get in touch with the patient. Depending on how things show from that standpoint the patient may need to see Dr. Ardyth Gal who is the infectious disease specialist although he really does not want a PICC line again. No fevers, chills, nausea, vomiting, or diarrhea. He is tolerating hyperbaric oxygen therapy very well. Electronic Signature(s) Signed: 05/03/2019 2:03:23 PM By: Worthy Keeler [name] Entered By: Worthy Keeler on 05/03/2019 14:03:23 [name],  [name] ([id number]) -------------------------------------------------------------------------------- Physical Exam Details Patient Name: [name], [name] Date of Service: 05/03/2019 10:30 AM Medical Record Number: [id number] Patient Account Number: [id number] Date of Birth/Sex: 1950-07-23 (68 y.o. M) Treating RN: Montey Hora Primary Care Provider: [name] Other Clinician: Referring Provider: [name] Treating Provider/Extender: [name], HOYT Weeks in Treatment: 33 Constitutional Well-nourished and well-hydrated in no acute distress. Respiratory normal breathing without difficulty. clear to auscultation bilaterally. Cardiovascular regular rate and rhythm with normal S1, S2. Psychiatric this patient is able to make decisions and demonstrates good insight into disease process. Alert and Oriented x 3. pleasant and cooperative. Notes Patient's wound bed currently showed signs of good granulation at this time in some areas although he does have several areas of hyper granulation noted as well. Fortunately there is no evidence of infection at this point that  is obvious and again he still has drainage but I believe we may be able to do some things to help prevent that from being an issue. That is gone include not putting the gauze dressing in following the Hydrofera Blue dressing and we will just use the Hydrofera Blue than the cover which is going to be [employer]. Electronic Signature(s) Signed: 05/03/2019 2:04:18 PM By: Worthy Keeler [name] Entered By: Worthy Keeler on 05/03/2019 14:04:17 [name], [name] ([id number]) -------------------------------------------------------------------------------- Physician Orders Details Patient Name: [name] Date of Service: 05/03/2019 10:30 AM Medical Record Number: [id number] Patient Account Number: [id number] Date of Birth/Sex: 1951/02/04 (68 y.o. 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 Skin Barriers/Peri-Wound Care Wound #1 Right Gluteal fold o Other: - Apply zinc oxide to peri-wound area. Primary Wound Dressing Wound #1 Right Gluteal fold o Hydrafera Blue Ready Transfer Secondary Dressing Wound #1 Right Gluteal fold o XtraSorb - Secure with Tegaderm Dressing Change Frequency Wound #1 Right Gluteal fold o Change dressing every  day. Follow-up Appointments Wound #1 Right Gluteal fold o Return Appointment in 1 week. 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 o If appropriate for treatment, begin HBOT per protocol: o 2.0 ATA for 90 Minutes without Air Breaks o One treatment per day (delivered Monday through Friday unless otherwise specified in Special Instructions below): o Total # of Treatments: - 40 Electronic Signature(s) Signed: 05/03/2019 5:32:28 PM By: Montey Hora Signed: 05/04/2019 6:12:50 PM By: Worthy Keeler [name] Entered By: Montey Hora on 05/03/2019 11:05:57 [name], [name] ([id number]) -------------------------------------------------------------------------------- Problem List Details Patient Name: [name] Date of Service: 05/03/2019 10:30 AM Medical Record Number: [id number] Patient Account Number: [id number] Date of Birth/Sex: Jan 26, 1951 (68 y.o. 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 He states that there is no history of diabetes that he is aware of. He does have issues with his prostate and is currently receiving treatment for this by way of oral medication. With that being said I do not have a lot of details in that regard. Nonetheless the patient presents today as a result of having been referred to [place] by another provider initially home health was set to come out and take care of his wound although due to the fact that he apparently drives he's not able to receive home health. His wife is therefore trying to help take care of this wound within although they have been struggling with what exactly to do at this point. She states that she can do some things but she is definitely not a nurse and does have some issues with looking at blood. The good news is the wound does not appear to be too deep and is fairly superficial at this point. There is no slough noted there is some nonviable skin noted around the surface of the wound and the perimeter at this point. The  central portion of the wound appears to be very good with a dermal layer noted this does not appear to be again deep enough to extend it to subcutaneous tissue at this point. 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 In the pump he has Dilaudid and baclofen. 12/07/17 on evaluation today patient presents for follow-up concerning his ongoing lower back thigh ulcer on the right. He states that he did not get the supplies ordered and therefore has not really been able to perform the dressing changes as directed exactly. His wife was able to get some Boarder Foam Dressing's from the drugstore and subsequently has been using hydrogel which did help to a degree in the wound does appear to be able smaller. There is actually more drainage this week noted than previous. 12/21/17 on evaluation today patient appears to be doing rather well in regard to his right gluteal ulcer. He has been tolerating the dressing changes without complication. There does not appear to be any evidence of infection at this point in time. Overall the wound  does seem to be making some progress as far as the edges are concerned there's not as much in the way of overlapping of the external wound edges and he has a good epithelium to wound bed border for the most part. This however is not true right at the 12 o'clock location over the span of a little over a centimeters which actually will require debridement today to clean this away and hopefully allow it to continue to heal more appropriately. 12/28/17 on evaluation today patient appears to be doing rather well in regard to his ulcer in the left gluteal region. He's been tolerating the dressing changes without complication. Apparently he has had some difficulty getting his dressing material. Apparently there's been some confusion with ordering we're gonna check into this. Nonetheless  overall he's been showing signs of improvement which is good news. Debridement is not required today. 01/04/18 on evaluation today patient presents for follow-up concerning his right gluteal ulcer. He has been tolerating the dressing changes fairly well. On inspection today it appears he may actually have some maceration them concerned about the fact that he may be developing too much moisture in and around the wound bed which can cause delay in healing. With that being said he unfortunately really has not showed significant signs of improvement since last week's evaluation in fact this may even be just the little bit/slightly larger. Nonetheless he's been having a lot of discomfort I'm not sure this is even related to the wound as he has no pain when I'm to breeding or otherwise cleaning the wound during evaluation today. Nonetheless this is something that we did recommend he talked to his pain specialist concerning. 01/11/18 on evaluation today patient appears to be doing better in regard to his ulceration. He has been tolerating the dressing Niemczyk, Jaystin J. ([id number]) changes without complication. With that being said overall there's no evidence of infection which is good news. The only thing is he did receive the hatch affair blue classic versus the ready nonetheless I feel like this is perfectly fine and appears to have done well for him over the past week. 01/25/18 on evaluation today patient's wound actually appears to be a little bit larger than during the last evaluation. The good news is the majority of the wound edges actually appear to be fairly firmly attached to the wound bed unfortunately again we're not really making progress in regard to the size. Roughly the wound is about the same size as when I first saw him although again the wound margin/edges appear to be much better. 02/01/18 on evaluation today patient actually appears to be doing very well in regard to his wound. Applying  the Prisma dry does seem to be better although he does still have issues with slow progression of the wound. There was a slight improvement compared to last week's measurements today. Nonetheless I have been considering other options as far as the possibility of Theraskin or even a snap vac. In general I'm not sure that the Theraskin due to location of the wound would be a very good idea. Nonetheless I do think that a snap vac could be a possibility for the patient and in fact I think this could even be an excellent way to manage the wound possibly seeing some improvement in a very rapid fashion here. Nonetheless this is something that we would need to get approved and I did have a lengthy conversation with the patient about this today. 02/08/18 on evaluation today patient  appears to be doing a little better in regard to his ulcer. He has been tolerating the dressing changes without complication. Fortunately despite the fact that the wound is a little bit smaller it's not significantly so unfortunately. We have discussed the possibility of a snap vac we did check with insurance this is actually covered at this point. Fortunately there does not appear to be any sign of infection. Overall I'm fairly pleased with how things seem to be appearing at this point. 02/15/18 on evaluation today patient appears to be doing rather well in regard to his right gluteal ulcer. Unfortunately the snap vac did not stay in place with his sheer and friction this came loose and did not seem to maintain seal very well. He worked for about two days and it did seem to do very well during that time according to his wife but in general this does not seem to be something that's gonna be beneficial for him long-term. I do believe we need to go back to standard dressings to see if we can find something that will be of benefit. 03/02/18- He is here in follow up evaluation; there is minimal change in the wound. He will continue with  the same treatment plan, would consider changing to iodosrob/iodoflex if ulcer continues to to plateau. He will follow up next week 03/08/18 on evaluation today patient's wound actually appears to be about the same size as when I previously saw him several weeks back. Unfortunately he does have some slightly dark discoloration in the central portion of the wound which has me concerned about pressure injury. I do believe he may be sitting for too long a period of time in fact he tells me that "I probably sit for much too long". He does have some Slough noted on the surface of the wound and again as far as the size of the wound is concerned I'm really not seeing anything that seems to have improved significantly. 03/15/18 on evaluation today patient appears to be doing fairly well in regard to his ulcer. The wound measured pretty much about the same today compared to last week's evaluation when looking at his graph. With that being said the area of bruising/deep tissue injury that was noted last week I do not see at this point. He did get a new cushion fortunately this does seem to be have been of benefit in my pinion. It does appear that he's been off of this more which is good news as well I think that is definitely showing in the overall wound measurements. With that being said I do believe that he needs to continue to offload I don't think that the fact this is doing better should be or is going to allow him to not have to offload and explain this to him as well. Overall he seems to be in agreement the plan I think he understands. The overall appearance of the wound bed is improved compared to last week I think the Iodoflex has been beneficial in that regard. 03/29/18 on evaluation today patient actually appears to be doing rather well in regard to his wound from the overall appearance standpoint he does have some granulation although there's some Slough on the surface of the wound noted as well. With  that being said he unfortunately has not improved in regard to the overall measurement of the wound in volume or in size. I did have a discussion with him very specifically about offloading today. He actually does work  although he mainly is just sitting throughout the day. He tells me he offloads by "lifting himself up for 30 seconds off of his chair occasionally" purchase from [employer] which does seem to have helped. And he has a new cushion that he with that being said he's also able to stand some for a very short period of time but not significant enough I think to provide appropriate offloading. I think the biggest issue at this point with the wound and the fact is not healing as quickly as we would like is due to the fact that he is really not able to appropriately offload while at work. He states the beginning after his injury he actually had a bed at his job that he could lay on in order to offload and that does seem to have been of help back at that time. Nonetheless he had not done this in quite some time unfortunately. I think that could be helpful for him this is something I would like for him to look into. DETAVIUS, BLOXSOM ([id number]) 04/05/18 on evaluation today patient actually presents for follow-up concerning his right gluteal ulcer. Again he really is not significantly improved even compared to last week. He has been tolerating the dressing changes without complication. With that being said fortunately there appears to be no evidence of infection at this time. He has been more proactive in trying to offload. 04/12/18 on evaluation today patient actually appears to be doing a little better in regard to his wound and the right gluteal fold region. He's been tolerating the dressing changes since removing the oasis without complication. However he was having a lot of burning initially with the oasis in place. He's unsure of exactly why this was given so much discomfort but  he assumes that it was the oasis itself causing the problem. Nonetheless this had to be removed after about three days in place although even those three days seem to have made a fairly good improvement in regard to the overall appearance of the wound bed. In fact is the first time that he's made any improvement from the standpoint of measurements in about six weeks. He continues to have no discomfort over the area of the wound itself which leads me to wonder why he was having the burning with the oasis when he does not even feel the actual debridement's themselves. I am somewhat perplexed by this. 04/19/18 on evaluation today patient's wound actually appears to be showing signs of epithelialization around the edge of the wound and in general actually appears to be doing better which is good news. He did have the same burning after about three days with applying the Endoform last week in the same fashion that I would generally apply a skin substitute. This seems to indicate that it's not the oasis to cause the problem but potentially the moisture buildup that just causes things to burn or there may be some other reaction with the skin prep or Steri-Strips. Nonetheless I'm not sure that is gonna be able to tolerate any skin substitute for a long period of time. The good news is the wound actually appears to be doing better today compared to last week and does seem to finally be making some progress. 04/26/18 on evaluation today patient actually appears to be doing rather well in regard to his ulcer in the right gluteal fold. He has been tolerating the dressing changes without complication which is good news. The Endoform does seem to be helping  although he was a little bit more macerated this week. This seems to be an ongoing issue with fluid control at this point. Nonetheless I think we may be able to add something like Drawtex to help control the drainage. 05/03/18 on evaluation today patient appears  to actually be doing better in regard to the overall appearance of his wound. He has been tolerating the dressing changes without complication. Fortunately there appears to be no evidence of infection at this time. I really feel like his wound has shown signs as of today of turning around last week I thought so as well and definitely he could be seen in this week's overall appearance and measurements. In general I'm very pleased with the fact that he finally seems to be making a steady but sure progress. The patient likewise is very pleased. 05/17/18 on evaluation today patient appears to be doing more poorly unfortunately in regard to his ulcer. He has been tolerating the dressing changes without complication. With that being said he tells me that in the past couple of days he and his wife have noticed that we did not seem to be doing quite as well is getting dark near the center. Subsequently upon evaluation today the wound actually does appear to be doing worse compared to previous. He has been tolerating the dressing changes otherwise and he states that he is not been sitting up anymore than he was in the past from what he tells me. Still he has continued to work he states "I'm tired of dealing with this and if I have to just go home and lay in the bed all the time that's what I'll do". Nonetheless I am concerned about the fact that this wound does appear to be deeper than what it was previous. 05/24/18 upon evaluation today patient actually presents after having been in the hospital due to what was presumed to be sepsis secondary to the wound infection. He had an elevated white blood cell count between 14 and 15. With that being said he does seem to be doing somewhat better now. His wound still is giving him some trouble nonetheless and he is obviously concerned about the fact likely talked about that this does seem to go more deeply than previously noted. I did review his wound culture which  showed evidence of Staphylococcus aureus him and group B strep. Nonetheless he is on antibiotics, Levaquin, for this. Subsequently I did review his intake summary from the hospital as well. I also did look at the CT of the lumbar spine with contrast that was performed which showed no bone destruction to suggest lumbar disguises/osteomyelitis or sacral osteomyelitis. There was no paraspinal abscess. Nonetheless it appears this may have been more of just a soft tissue infection at this point which is good news. He still is nonetheless concerned about the wound which again I think is completely reasonable considering everything he's been through recently. 05/31/18 on evaluation today on evaluation today patient actually appears to be showing signs of his wound be a little bit deeper than what I would like to see. Fortunately he does not show any signs of significant infection although his temperature was 99 today he states he's been checking this at home and has not been elevated. Nonetheless with the undermining that I'm seeing at this point I am becoming more concerned about the wound I do think that offloading is a key factor here that is preventing the speedy recovery at this point. There does not appear  to be any evidence of again over infection noted. He's been using Santyl currently. ZHANE, CONTRERAZ ([id number]) 06/07/18 the patient presents today for follow-up evaluation regarding the left ulcer in the gluteal region. He has been tolerating the Wound VAC fairly well. He is obviously very frustrated with this he states that to mean is really getting in his way. There does not appear to be any evidence of infection at this time he does have a little bit of odor I do not necessarily associate this with infection just something that we sometimes notice with Wound VAC therapy. With that being said I can definitely catch a tone of discontentment overall in the patient's demeanor today. This when  he was previously in the hospital an CT scan was done of the lumbar region which did not reveal any signs of osteomyelitis. With that being said the pelvis in particular was not evaluated distinctly which means he could still have some osteonecrosis I. Nonetheless the Wound VAC was started on Thursday I do want to get this little bit more time before jumping to a CT scan of the pelvis although that is something that I might would recommend if were not see an improvement by that time. 06/14/18 on evaluation today patient actually appears to be doing about the same in regard to his right gluteal ulcer. Again he did have a CT scan of the lumbar spine unfortunately this did not include the pelvis. Nonetheless with the depth of the wound that I'm seeing today even despite the fact that I'm not seeing any evidence of overt cellulitis I believe there's a good chance that we may be dealing with osteomyelitis somewhere in the right Ischial region. No fevers, chills, nausea, or vomiting noted at this time. 06/21/18 on evaluation today patient actually appears to be doing about the same with regard to his wound. The tunnel at 6 o'clock really does not appear to be any deeper although it is a little bit wider. I think at this point you may want to start packing this with white phone. Unfortunately I have not got approval for the CT scan of the pelvis as of yet due to the fact that Medicare apparently has been denied it due to the diagnosis codes not being appropriate according to Medicare for the test requested. With that being said the patient cannot have an MRI and therefore this is the only option that we have as far as testing is concerned. The patient has had infection and was on antibiotics and been added code for cellulitis of the bottom to see if this will be appropriate for getting the test approved. Nonetheless I'm concerned about the infection have been spread deeper into the Ischial region. 06/28/18  on evaluation today patient actually appears to be doing rather well all things considered in regard to the right gluteal ulcer. He has been tolerating the dressing changes without complication. With that being said the Wound VAC he states does have to be replaced almost every day or at least reinforced unfortunately. Patient actually has his CT scan later this morning we should have the results by tomorrow. 07/05/18 on evaluation today patient presents for follow-up concerning his right Ischial ulcer. He did see the surgeon Dr. Lysle Pearl last week. They were actually very happy with him and felt like he spent a tremendous amount of time with them as far as discussing his situation was concerned. In the end Dr. Lysle Pearl did contact me as well and determine that he would  not recommend any surgical intervention at this point as he felt like it would not be in the patient's best interest based on what he was seeing. He recommended a referral to infectious disease. Subsequently this is something that Dr. Ines Bloomer office is working on setting up for the patient. As far as evaluation today is concerned the patient's wound actually appears to be worse at this point. I am concerned about how things are progressing and specifically about infection. I do not feel like it's the deeper but the area of depth is definitely widening which does have me concerned. No fevers, chills, nausea, or vomiting noted at this time. I think that we do need initiate antibiotic therapy the patient has an allow allergy to amoxicillin/penicillin he states that he gets a rash since childhood. Nonetheless she's never had the issues with [employer] or cephalosporins in general but he is aware of. 07/27/18 on evaluation today patient presents following admission to the hospital on 07/09/18. He was subsequently discharged on 07/20/18. On 07/15/18 the patient underwent irrigation and debridement was soft tissue biopsy and bone biopsy as well  as placement of a Wound VAC in the OR by Dr. Celine Ahr. During the hospital course the patient was placed on a Wound VAC and recommended follow up with surgery in three weeks actually with Dr. Delaine Lame who is infectious disease. The patient was on vancomycin during the hospital course. 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 He also had a bone culture which revealed evidence of methicillin-resistant staph aureus. He is updated CT scan 07/09/18 reveals that he had progression of the which was performed on wound to breakdown down to the trochanter where he actually had irregularities there as well suggestive of osteomyelitis. This was a change just since 9 December when we last performed a CT scan. Obviously this one had gone downhill quite significantly and rapidly. At this point upon evaluation I feel like in general the patient's wound seems to be doing fairly well all things considered upon my evaluation today. Obviously this is larger and deeper than what I previously evaluated but at the same time he seems to be making some progress as far as the appearance of the granulation tissue is concerned. I'm happy in that regard. No fevers, chills, nausea, or vomiting noted at this time. He is on IV vancomycin and Rocephin at the facility. He is currently in [facility]. 08/03/18 upon evaluation today patient's wound appears to be doing better in regard to the overall appearance at this point in time. Fortunately he's been tolerating the Wound VAC without complication and states that the facility has been taking MONTI, TASHJIAN. ([id number]) excellent care of the wound site. Overall I see some Slough noted on the surface which I am going to attempt sharp debridement today of but nonetheless other than this I feel like he's making progress. 08/09/18 on evaluation today patient's wound appears to be doing much better compared to even last week's evaluation.  Do believe that the Wound VAC is been of great benefit for him. He has been tolerating the dressing changes that is the Wound VAC without any complication and he has excellent granulation noted currently. There is no need for sharp debridement at this point. 08/16/18 on evaluation today patient actually appears to be doing very well in regard to the wound in the right gluteal fold region. This is showing signs of progress and again appears to be very healthy which is excellent news.  Fortunately there is no sign of active infection by way of odor or drainage at this point. Overall I'm very pleased with how things stand. He seems to be tolerating the Wound VAC without complication. 08/23/18 on evaluation today patient actually appears to be doing better in regard to his wound. He has been tolerating the Wound VAC without complication and in fact it has been collecting a significant amount of drainage which I think is good news especially considering how the wound appears. Fortunately there is no signs of infection at this time definitely nothing appears to be worse which is good news. He has not been started on the Bactrim and Flagyl that was recommended by Dr. Delaine Lame yet. I did actually contact her office this morning in order to check and see were things are that regard their gonna be calling me back. 08/30/18 on evaluation today patient actually appears to show signs of excellent improvement today compared to last evaluation. The undermining is getting much better the wound seems to be feeling quite nicely and I'm very pleased that the granulation in general. With that being said overall I feel like the patient has made excellent progress which is great news. No fevers, chills, nausea, or vomiting noted at this time. 09/06/18 on evaluation today patient actually appears to be doing rather well in regard to his right gluteal ulcer. This is showing signs of improvement in overall I'm very pleased  with how things seem to be progressing. The patient likewise is please. Overall I see no evidence of infection he is about to complete his oral antibiotic regimen which is the end of the antibiotics for him in just about three days. 09/13/18 on evaluation today patient's right Ischial ulcer appears to be showing signs of continued improvement which is excellent news. He's been tolerating the dressing changes without complication. Fortunately there's no signs of infection and the wound that seems to be doing very well. 09/28/18 on evaluation today patient appears to be doing rather well in regard to his right Ischial ulcer. He's been tolerating the Wound VAC without complication he knows there's much less drainage than there used to be this obviously is not a bad thing in my pinion. There's no evidence of infection despite the fact is but nothing about it now for several weeks. 10/04/18 on evaluation today patient appears to be doing better in regard to his right Ischial wound. He has been tolerating the Wound VAC without complication and I do believe that the silver nitrate last week was beneficial for him. Fortunately overall there's no evidence of active infection at this time which is great news. No fevers, chills, nausea, or vomiting noted at this time. 10/11/18 on evaluation today patient actually appears to be doing rather well in regard to his Ischial ulcer. He's been tolerating the Wound VAC still without complication I feel like this is doing a good job. No fevers, chills, nausea, or vomiting noted at this time. 11/01/18 on evaluation today patient presents after having not been seen in our clinic for several weeks secondary to the fact that he was on evaluation today patient presents after having not been seen in our clinic for several weeks secondary to the fact that he was in a skilled nursing facility which was on lockdown currently due to the covert 19 national emergency. Subsequently he  was discharged from the facility on this past Friday and subsequently made an appointment to come in to see yesterday. Fortunately there's no signs of active  infection at this time which is good news and overall he does seem to have made progress since I last saw. Overall I feel like things are progressing quite nicely. The patient is having no pain. 11/08/18 on evaluation today patient appears to be doing okay in regard to his right gluteal ulcer. He has been utilizing a Wound VAC home health this changing this at this point since he's home from the skilled nursing facility. Fortunately there's no signs of obvious active infection at this time. Unfortunately though there's no obvious active infection he is having some maceration and his wife states that when the sheets of the Wound VAC office on Sunday when it broke seal that he ended up having significant issues with some smell as well there concerned about the possibility of infection. Fortunately there's No fevers, chills, nausea, or vomiting noted at this time. NICKOLAI, ZERBE ([id number]) 11/15/18 on evaluation today patient actually appears to be doing well in regard to his right gluteal ulcer. He has been tolerating the dressing changes without complication. Specifically the Wound VAC has been utilized up to this point. Fortunately there's no signs of infection and overall I feel like he has made progress even since last week when I last saw him. I'm actually fairly happy with the overall appearance although he does seem to have somewhat of a hyper granular overgrowth in the central portion of the wound which I think may require some sharp debridement to try flatness out possibly utilizing chemical cauterization following. 11/23/18 on evaluation today patient actually appears to be doing very well in regard to his sacral ulcer. He seems to be showing signs of improvement with good granulation. With that being said he still has the small area  of hyper granulation right in the central portion of the wound which I'm gonna likely utilize silver nitrate on today. Subsequently he also keeps having a leak at the 6 o'clock location which is unfortunate we may be able to help out with some suggestions to try to prevent this going forward. Fortunately there's no signs of active infection at this time. 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 He's been tolerating the dressing changes without complication. Fortunately there's no signs of active infection at this time. I've been rather pleased with how things have progressed there still some evidence of pressure getting to the area with some redness right around the immediate wound opening. Nonetheless other than this I'm not seeing any significant complications or issues the wound is somewhat hyper granular. Upon discussing with the patient and his wife today I'm not sure that the wound is being packed to the base with the foam at this point. And if it's not been packed fully that may be part of the reason why is not seen as much improvement as far as the granulation from the base out. Again we do not want pack too tightly but we need some of the firm to get to the base of the wound. I discussed this with patient and his wife today. 12/06/18 on evaluation today patient appears to be doing well in regard to his right gluteal pressure ulcer. He's been tolerating the dressing changes without complication. Fortunately there's no signs of active infection. He still has some hyper granular tissue and I do think it would be appropriate to continue with the chemical cauterization as of today. 12/16/18 on evaluation today patient  actually appears to be doing okay in regard to his right gluteal ulcer. He is been tolerating the dressing changes without complication including the Wound VAC. Overall I feel like  nothing seems to be worsening I do feel like that the hyper granulation buds in the central portion of the wound have improved to some degree with the silver nitrate. We will have to see how things continue to progress. 12/20/18 on evaluation today patient actually appears to be doing much worse in my pinion even compared to last week's evaluation. Unfortunately as opposed to showing any signs of improvement the areas of hyper granular tissue in the central portion of the wound seem to be getting worse. Subsequently the wound bed itself also seems to be getting deeper even compared to last week which is both unusual as well as concerning since prior he had been shown signs of improvement. Nonetheless I think that the issue could be that he's actually having some difficulty in issues with a deeper infection. There's no external signs of infection but nonetheless I am more worried about the internal, osteomyelitis, that could be restarting. He has not been on antibiotics for some time at this point. I think that it may be a good idea to go ahead and started back on an antibiotic therapy while we wait to see what the testing shows. 12/27/18 on evaluation today patient presents for follow-up concerning his left gluteal fold wound. Fortunately he appears to be doing well today. I did review the CT scan which was negative for any signs of osteomyelitis or acute abnormality this is excellent news. Overall I feel like the surface of the wound bed appears to be doing significantly better today compared to previously noted findings. There does not appear any signs of infection nor does he have any pain at this time. 01/03/19 on evaluation today patient actually appears to be doing quite well in regard to his ulcer. Post debridement last week he really did not have too much bleeding which is good news. Fortunately today this seems to be doing some better but we still has some of the hyper granular tissue noted in  the base of the wound which is gonna require sharp debridement today as well. Overall I'm pleased with how things seem to be progressing since we switched away from the Wound VAC I think he is making some progress. 01/10/19 on evaluation today patient appears to be doing better in regard to his right gluteal fold ulcer. He has been tolerating the dressing changes without complication. The debridement to seem to be helping with current away some of the poor hyper granular tissue bugs throughout the region of his gluteal fold wound. He's been tolerating the dressing changes otherwise without complication which is great news. No fevers, chills, nausea, or vomiting noted at this time. 01/17/19 on evaluation today patient actually appears to be doing excellent in regard to his wound. He's been tolerating the dressing changes without complication. Fortunately there is no signs of active infection at this time which is great news. No fevers, chills, nausea, or vomiting noted at this time. DEWITTE, KROGSTAD ([id number]) 01/24/19 on evaluation today patient actually appears to be doing quite well with regard to his ulcer. He has been tolerating the dressing changes without complication. Fortunately there's no signs of active infection at this time. Overall been very pleased with the progress that he seems to be making currently. 01/31/19-Patient returns at 1 week with apparent similarity in dimensions to the wound,  with no signs of infection, he has been changing dressings twice a day 02/08/19 upon evaluation today patient actually appears to be doing well with regard to his right Ischial ulcer. The wound is not appear to be quite as deep and seems to be making progress which is good news. With that being said I'm still reluctant to go back to the Wound VAC at this point. He's been having to change the dressings twice a day which is a little bit much in my pinion from the wound care supplies standpoint. I  think that possibly attempting to utilize extras orbit may be beneficial this may also help to prevent any additional breakdown secondary to fluid retention in the wound itself. The patient is in agreement with giving this a try. 02/15/19 on evaluation today patient actually appears to be doing decently well with regard to his ulcer in the right to gluteal fold location. He's been tolerating the dressing changes without complication. Fortunately there is no signs of active infection at this time. He is able to keep the current dressing in place more effectively for a day at a time whereas before he was having a changes to to three times a day. The actions or has been helpful in this regard. Fortunately there's no signs of anything getting worse and I do feel like he showing signs of good improvement with regard to the wound bed status. 02/22/2019 patient appears to be doing very well today with regard to his ulcer in the gluteal fold. Fortunately there is no signs of active infection and he has been tolerating the dressing changes without any complication. Overall extremely pleased with how things seem to be progressing. He has much less of the hyper granular projections within the wound these have slowly been debrided away and he seems to be doing well. The wound bed is more uniform. 03/01/19 on evaluation today patient appears to be doing unfortunately about the same in regard to his gluteal ulcer. He's been tolerating the dressing changes without complication. Fortunately there's no signs of active infection at this time. With that being said he continues to develop these hyper granular projections which I'm unsure of exactly what they are and why they are rising. Nonetheless I explained to the patient that I do believe it would be a good idea for [place] to stand a biopsy sample for pathology to see if that can shed any light on what exactly may be going on here. Fortunately I do not see any obvious  signs of infection. With that being said the patient has had a little bit more drainage this week apparently compared to last week. 03/08/2019 on evaluation today patient actually appears to look somewhat better with regard to the appearance of his wound bed at this time. This is good news. Overall I am very pleased with how things seem to have progressed just in the past week with a switch to the [facility] dressing. I think that has been beneficial for him. With that being said at this time the patient is concerned about his biopsy that I sent off last week unfortunately I do not have that report as of yet. Nonetheless we have called to obtain this and hopefully will hear back from the lab later this morning. 03/15/19 on evaluation today patient's wound actually appears to be doing okay today with regard to the overall appearance of the wound bed. He has been tolerating the dressing changes without complication he still has hyper granular tissue  noted but fortunately that seems to be minimal at this point compared to some of what we've seen in the past. Nonetheless I do think that he is still having some issues currently with some of this type of granulation the biopsy and since all showed nothing more than just evidence of granulation tissue. Therefore there really is nothing different to initiate or do at this point. 03/24/19 on evaluation today patient appears to be doing a little better with regard to his ulcer. He's been tolerating the dressing changes without complication. Fortunately there is no signs of active infection at this time. No fevers, chills, nausea, or vomiting noted at this time. I'm overall pleased with how things seem to be progressing. 03/29/2019 on evaluation today patient appears to be doing about the same in regard to his ulcer in the right gluteal fold. Unfortunately he is not seeming to make a lot of progress and the wound is somewhat stalled. There is no signs of  active infection externally but I am concerned about the possibility of infection continuing in the ischial location which previously he did have infection noted. Again is not able to have an MRI so probably our best option for testing for this would be a triple phase bone scan which will detect subtle changes in the bone more so than plain film x-rays. In the past they really have not been beneficial and in fact the CT scans even have been somewhat questionable at times. Nonetheless there is no signs of systemic infection which is at least good news but again his wound is not healing at all on the predicted schedule. 04/05/19 on evaluation today patient appears to be doing well all things considering with regard to his wound and the right gluteal fold. He actually has his triple bone scan scheduled for sometime in the next couple of days. With that being said I've also been looking to other possibilities of what could be causing hyper granular tissue were looking into the bone scan again for evaluating for the risk or possibility of infection deeper and I'm also gonna go ahead and see about obtaining a deep tissue [place], Jamieson J. ([id number]) culture today to send and see if there's any evidence of infection noted on culture. He's in agreement with that plan. 04/12/2019 on evaluation today patient presents for reevaluation here in our clinic concerning ongoing issues with his right gluteal fold ulcer. 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 It was discussed with him at that point that I think it would be appropriate for him to proceed with hyperbaric oxygen therapy especially in light of the fact that he is previously been on IV antibiotics at the beginning of the year for close to 3 months followed by several weeks of oral antibiotics that was all prescribed by infectious  disease. He had surgical debridement around Christmas December 2019 due to an abscess and osteomyelitis of the ischial bone. Unfortunately this has not really proceeded as well as we would have liked and again we did a CT scan even a couple months ago as he cannot have an MRI secondary to having issues with both a pain pump as well as a spinal cord stimulator which prevent him from going into an MRI machine. With that being said there were chronic changes noted at the ischial tuberosity which had progressed since December 2019 there was no evidence  of fluid collection on that initial CT scan. With that being said that was on January 21, 2019. I am not sure that it was a sensitive enough test as compared to an MRI and then subsequently I ordered a bone scan for him which was actually completed on 03/29/2019 and this revealed that he does indeed have positive osteomyelitis involving the right ischial tuberosity. This is adjacent to the ulcer and I think is the reason that his ulcer is not healing. Subsequently I am in a place him back on oral antibiotics today unfortunately his wound culture showed just mixed gram-negative flora with no specific findings of a predominant organism. Nonetheless being with the location I think a good broad-spectrum antibiotic for gram-negative's is a good choice at this point he is previously taken Levaquin without significant issues and I think that is an appropriate antibiotic for him at this time. 04/19/2019 on evaluation today patient actually appears to be doing fairly well with regard to his wound. He has been tolerating the dressing changes without complication. Fortunately there is no signs of active infection and he has been taking the oral antibiotics at this time. Subsequently we did make a referral to infectious disease although Dr. Steva Ready wants the patient to be seen by general surgery first in order apparently to see if there is anything from a surgical  standpoint that should be done prior to initiation of IV antibiotic therapy. Again the patient is okay with actually seeing Dr. Celine Ahr whom he has seen before we will make that referral. 04/26/2019 on evaluation today patient actually appears to be doing well with regard to his wound. He has been tolerating the dressing changes without complication. Fortunately there is no signs of active infection at this time. I do believe that the hyperbaric oxygen therapy along with the antibiotics which I prescribed at this point have been doing well for him. With that being said he has not seen Dr. Celine Ahr the surgeon yet in fact they have not been contacted for scheduling an appointment as of yet. Subsequently the patient is not on antibiotics currently by IV Dr. Steva Ready did want him to see Dr. Celine Ahr first which we are working on trying to get scheduled. We again give the information to the patient today for Dr. Celine Ahr and her number as far as contacting their office to see about getting something scheduled. Again were looking for whether or not they recommend any surgical intervention. 05/03/2019 on evaluation today patient appears to be doing about the same with regard to his wound there may be a little bit of filling in in regard to the base of the wound but still he has quite a bit of hyper granular tissue that I am seeing today. I believe that he may may need a more aggressive sharp debridement possibly even by the surgeon or under anesthesia in order to clear away some of the hyper granular material in order to help allow for appropriate granulation to fill in. We have made a referral to Dr. Celine Ahr unfortunately it sounds as if they did not receive the referral we contacted them today they should be get in touch with the patient. Depending on how things show from that standpoint the patient may need to see Dr. Ardyth Gal who is the infectious disease specialist although he really does not want a PICC  line again. No fevers, chills, nausea, vomiting, or diarrhea. He is tolerating hyperbaric oxygen therapy very well. Patient History Information obtained from Patient. Family History  Hypertension - Father, Stroke - Mother, No family history of Cancer, Diabetes, Heart Disease, Kidney Disease, Lung Disease, Seizures, Thyroid Problems, Tuberculosis. Social History Never smoker, Marital Status - Married, Alcohol Use - Never, Drug Use - No History, Caffeine Use - Daily. Medical History Eyes Patient has history of Cataracts - both removed BURCE, TOYE. ([id number]) Denies history of Glaucoma, Optic Neuritis Ear/Nose/Mouth/Throat Denies history of Chronic sinus problems/congestion, Middle ear problems Hematologic/Lymphatic Denies history of Anemia, Hemophilia, Human Immunodeficiency Virus, Lymphedema Respiratory Denies history of Aspiration, Asthma, Chronic Obstructive Pulmonary Disease (COPD), Pneumothorax, Sleep Apnea, Tuberculosis Cardiovascular Patient has history of Hypertension - takes medication Denies history of Angina, Arrhythmia, Congestive Heart Failure, Coronary Artery Disease, Deep Vein Thrombosis, Hypotension, Myocardial Infarction, Peripheral Arterial Disease, Peripheral Venous Disease, Phlebitis, Vasculitis Gastrointestinal Denies history of Cirrhosis , Colitis, Crohn s, Hepatitis A, Hepatitis B, Hepatitis C Endocrine Denies history of Type I Diabetes, Type II Diabetes Genitourinary Denies history of End Stage Renal Disease Immunological Denies history of Lupus Erythematosus, Raynaud s, Scleroderma Integumentary (Skin) Denies history of History of Burn, History of pressure wounds Musculoskeletal Denies history of Gout, Rheumatoid Arthritis, Osteoarthritis, Osteomyelitis Neurologic Patient has history of Paraplegia - waist down Denies history of Dementia, Neuropathy, Quadriplegia, Seizure Disorder Oncologic Denies history of Received Chemotherapy, Received  Radiation Psychiatric Denies history of Anorexia/bulimia, Confinement Anxiety Medical And Surgical History Notes Oncologic Prostate cancer- currently treated with horomone therapy Review of Systems (ROS) Constitutional Symptoms (General Health) Denies complaints or symptoms of Fatigue, Fever, Chills, Marked Weight Change. Respiratory Denies complaints or symptoms of Chronic or frequent coughs, Shortness of Breath. Cardiovascular Denies complaints or symptoms of Chest pain, LE edema. Psychiatric Denies complaints or symptoms of Anxiety, Claustrophobia. Objective Constitutional Well-nourished and well-hydrated in no acute distress. RISTON, SCHULD ([id number]) Vitals Time Taken: 10:20 AM, Height: 73 in, Weight: 210 lbs, BMI: 27.7, Temperature: 97.6 F, Pulse: 72 bpm, Respiratory Rate: 16 breaths/min, Blood Pressure: 122/64 mmHg. Respiratory normal breathing without difficulty. clear to auscultation bilaterally. Cardiovascular regular rate and rhythm with normal S1, S2. Psychiatric this patient is able to make decisions and demonstrates good insight into disease process. Alert and Oriented x 3. pleasant and cooperative. General Notes: Patient's wound bed currently showed signs of good granulation at this time in some areas although he does have several areas of hyper granulation noted as well. Fortunately there is no evidence of infection at this point that is obvious and again he still has drainage but I believe we may be able to do some things to help prevent that from being an issue. That is gone include not putting the gauze dressing in following the Hydrofera Blue dressing and we will just use the Hydrofera Blue than the cover which is going to be [employer]. Integumentary (Hair, Skin) Wound #1 status is Open. Original cause of wound was Pressure Injury. The wound is located on the Right Gluteal fold. The wound measures 3.8cm length x 1.7cm width x 2.4cm depth; 5.074cm^2 area  and 12.177cm^3 volume. There is muscle and Fat Layer (Subcutaneous Tissue) Exposed exposed. There is no undermining noted, however, there is tunneling at 12:00 with a maximum distance of 4cm. There is a large amount of serous drainage noted. The wound margin is epibole. There is medium (34-66%) red, pink, hyper - granulation within the wound bed. There is a medium (34-66%) amount of necrotic tissue within the wound bed including Adherent Slough. 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 Skin Barriers/Peri-Wound Care: NAIROBI, PIPES ([id number]) Wound #1 Right Gluteal fold: Other: - Apply zinc oxide to peri-wound area. Primary Wound Dressing: Wound #1 Right Gluteal fold: Hydrafera Blue Ready Transfer Secondary Dressing: Wound #1 Right Gluteal fold: XtraSorb - Secure with Tegaderm Dressing Change Frequency: Wound #1 Right Gluteal fold: Change dressing every day. Follow-up Appointments: Wound #1 Right Gluteal fold: Return Appointment in 1 week. 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  My suggestion at this time is can be that we go ahead and continue with hyperbaric oxygen therapy as he is finally getting to the point after 2 weeks where we should start seeing some improvement overall in his appearance of the wound bed already on feeling like were seeing some of that. Subsequently he is also doing well with the [facility] dressing we will continue that along with the XtraSorb at this time. 2. He does need to have the appointment with Dr. Glenford Peers office and I did go ahead and have the nurse call today were recent and the referral is there stating that they did not receive it initially. I explained to the patient if having her anything by tomorrow morning to give the office a call they do have the number and if by any chance they happen to not be able to find the number will be happy to give that to them when they come in tomorrow for hyperbaric oxygen therapy. 3. I do think appropriate and continued offloading is still something he needs to focus on as well. We will see patient back for reevaluation in 1 week here in the clinic. If anything worsens or changes patient will contact our office for additional recommendations. Electronic Signature(s) Signed: 05/03/2019 2:05:25 PM By: Worthy Keeler [name] Entered By: Worthy Keeler on 05/03/2019 14:05:25 MERIL, PELINO ([id number]) -------------------------------------------------------------------------------- ROS/PFSH Details Patient Name: [name] Date of Service: 05/03/2019 10:30 AM Medical Record Number: [id number] Patient Account Number: [id number] Date of Birth/Sex: May 13, 1951 (68 y.o. M) Treating RN: Montey Hora Primary Care Provider: [name] Other Clinician: Referring Provider: [name] Treating Provider/Extender: [name], HOYT Weeks in Treatment: 9 Information Obtained From Patient Constitutional Symptoms (General Health) Complaints and Symptoms: Negative for: Fatigue;  Fever; Chills; Marked Weight Change Respiratory Complaints and Symptoms: Negative for: Chronic or frequent coughs; Shortness of Breath Medical History: Negative for: Aspiration; Asthma; Chronic Obstructive Pulmonary Disease (COPD); Pneumothorax; Sleep Apnea; Tuberculosis Cardiovascular Complaints and Symptoms: Negative for: Chest pain; LE edema Medical History: Positive for: Hypertension - takes medication Negative for: Angina; Arrhythmia; Congestive Heart Failure; Coronary Artery Disease; Deep Vein Thrombosis; Hypotension; Myocardial Infarction; Peripheral Arterial Disease; Peripheral Venous Disease; Phlebitis; Vasculitis Psychiatric Complaints and Symptoms: Negative for: Anxiety; Claustrophobia Medical History: Negative for: Anorexia/bulimia; Confinement Anxiety Eyes Medical History: Positive for: Cataracts - both removed Negative  for: Glaucoma; Optic Neuritis Ear/Nose/Mouth/Throat Medical History: Negative for: Chronic sinus problems/congestion; Middle ear problems Hematologic/Lymphatic Medical History: Negative for: Anemia; Hemophilia; Human Immunodeficiency Virus; Lymphedema ATHONY, BEIGHLEY. ([id number]) Gastrointestinal Medical History: Negative for: Cirrhosis ; Colitis; Crohnos; Hepatitis A; Hepatitis B; Hepatitis C Endocrine Medical History: Negative for: Type I Diabetes; Type II Diabetes Genitourinary Medical History: Negative for: End Stage Renal Disease Immunological Medical History: Negative for: Lupus Erythematosus; Raynaudos; Scleroderma Integumentary (Skin) Medical History: Negative for: History of Burn; History of pressure wounds Musculoskeletal Medical History: Negative for: Gout; Rheumatoid Arthritis; Osteoarthritis; Osteomyelitis Neurologic Medical History: Positive for: Paraplegia - waist down Negative for: Dementia; Neuropathy; Quadriplegia; Seizure Disorder Oncologic Medical History: Negative for: Received Chemotherapy; Received  Radiation Past Medical History Notes: Prostate cancer- currently treated with horomone therapy HBO Extended History Items Eyes: Cataracts Immunizations Pneumococcal Vaccine: Received Pneumococcal Vaccination: No Implantable Devices No devices added Family and Social History Cancer: No; Diabetes: No; Heart Disease: No; Hypertension: Yes - Father; Kidney Disease: No; Lung Disease: No; Seizures: No; Stroke: Yes - Mother; Thyroid Problems: No; Tuberculosis: No; Never smoker; Marital Status - Married; Alcohol Use: Never; Drug Use: No History; Caffeine Use: Daily JAKAYDEN, LOWERS ([id number]) Physician Affirmation I have reviewed and agree with the above information. Electronic Signature(s) Signed: 05/03/2019 5:32:28 PM By: Montey Hora Signed: 05/04/2019 6:12:50 PM By: Worthy Keeler [name] Entered By: Worthy Keeler on 05/03/2019 14:03:39 TAJAY, MCCARRY ([id number]) -------------------------------------------------------------------------------- SuperBill Details Patient Name: [name] Date of Service: 05/03/2019 Medical Record Number: [id number] Patient Account Number: [id number] Date of Birth/Sex: Nov 08, 1950 (68 y.o. 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

## 2019-05-03 NOTE — Progress Notes (Signed)
[name], [name] ([id number]) Visit Report for 05/03/2019 HBO Details Patient Name: [name], [name] Date of Service: 05/03/2019 8:00 AM Medical Record Number: [id number] Patient Account Number: [id number] Date of Birth/Sex: 02/22/1951 (68 y.o. 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 HBO Treatment Details Treatment Number: 12 Patient Type: Outpatient Chamber Type: Monoplace Chamber Serial #: [id number] Treatment Protocol: 2.0 ATA with 90 minutes oxygen, and no air breaks Treatment Details Compression Rate Down: 1.5 psi / minute De-Compression Rate Up: 2.0 psi / minute Compress Tx Pressure Air breaks and breathing periods Decompress Decompress Begins Reached (leave unused spaces blank) Begins Ends Chamber Pressure (ATA) 1 2 - - - - - - 2 1 Clock Time (24 hr) 08:21 08:32 - - - - - - 10:02 10:12 Treatment Length: 111 (minutes) Treatment Segments: 4 Vital Signs Capillary Blood Glucose Reference Range: 80 - 120 mg / dl HBO Diabetic Blood Glucose Intervention Range: <131 mg/dl or >249 mg/dl Time Vitals Blood Respiratory Capillary Blood Glucose Pulse Action Type: Pulse: Temperature: Taken: Pressure: Rate: Glucose (mg/dl): Meter #: Oximetry (%) Taken: Pre 08:15 120/78 66 16 98.9 159 Post 10:20 118/62 72 16 97.6 Treatment Response Treatment Toleration: Well Treatment Completion Treatment Completed without Adverse Event Status: HBO Attestation I certify that I supervised this HBO treatment in accordance with Medicare guidelines. A trained emergency response Yes team is  readily available per hospital policies and procedures. Continue HBOT as ordered. Yes Electronic Signature(s) [name], [name] ([id number]) Signed: 05/03/2019 2:07:10 PM By: Worthy Keeler [name] Previous Signature: 05/03/2019 12:02:54 PM Version By: Lorine Bears RCP, RRT, CHT Entered By: Worthy Keeler on 05/03/2019 14:07:09 [name], [name] ([id number]) -------------------------------------------------------------------------------- HBO Safety Checklist Details Patient Name: [name] Date of Service: 05/03/2019 8:00 AM Medical Record Number: [id number] Patient Account Number: [id number] Date of Birth/Sex: 05-30-1951 (68 y.o. M) Treating RN: Montey Hora Primary Care [name]: [name] Other Clinician: [name] Referring Puneet Masoner: [name] Treating Merikay Lesniewski/Extender: [name], HOYT Weeks in Treatment: 18 HBO Safety Checklist Items Safety Checklist Consent Form Signed Patient voided / foley secured and emptied When did you last eato morning Last dose of injectable or oral agent n/a Ostomy pouch emptied and vented if applicable NA Medtronic Baclofen Pump; Nevro All implantable devices assessed, documented and approved Spinal Stimulator Intravenous access site secured and place NA Valuables secured Linens and cotton and cotton/polyester blend (less than 51% polyester) Personal oil-based products / skin lotions / body lotions removed Wigs or hairpieces removed NA Smoking or tobacco materials removed NA Books / newspapers / magazines / loose paper removed Cologne, aftershave, perfume and deodorant removed Jewelry removed (may wrap wedding band) Make-up removed NA Hair care products removed Battery operated devices (external) removed Heating patches and chemical warmers removed Titanium eyewear removed NA Nail polish cured greater than 10 hours NA Casting material cured greater than 10 hours NA Hearing aids  removed NA Loose dentures or partials removed NA Prosthetics have been removed NA Patient demonstrates correct use of air break device (if applicable) Patient concerns have been addressed Patient grounding bracelet on and cord attached to chamber Specifics for Inpatients (complete in addition to above) Medication sheet sent with patient Intravenous medications needed or  due during therapy sent with patient Drainage tubes (e.g. nasogastric tube or chest tube secured and vented) Endotracheal or Tracheotomy tube secured Cuff deflated of air and inflated with saline Airway suctioned Electronic Signature(s) JOVI, ASIS ([id number]) Signed: 05/03/2019 12:02:54 PM By: Lorine Bears RCP, RRT, CHT Entered By: Becky Sax, Amado Nash on 05/03/2019 [id number]

## 2019-05-03 NOTE — Progress Notes (Addendum)
TRAJAN, MANGELS ([id number]) Visit Report for 05/03/2019 Arrival Information Details Patient Name: [name], [name] Date of Service: 05/03/2019 10:30 AM Medical Record Number: [id number] Patient Account Number: [id number] Date of Birth/Sex: 15-Mar-1951 (68 y.o. M) Treating RN: Montey Hora Primary Care Jahbari Repinski: [name] Other Clinician: Referring Sayda Grable: [name] Treating Demiya Magno/Extender: [name], HOYT Weeks in Treatment: 75 Visit Information History Since Last Visit Added or deleted any medications: No Patient Arrived: Wheel Chair Any new allergies or adverse reactions: No Arrival Time: 10:35 Had a fall or experienced change in No Accompanied By: wife activities of daily living that may affect Transfer Assistance: Manual risk of falls: Patient Identification Verified: Yes Signs or symptoms of abuse/neglect since last visito No Secondary Verification Process Completed: Yes Hospitalized since last visit: No Patient Requires Transmission-Based No Implantable device outside of the clinic excluding No Precautions: cellular tissue based products placed in the center Patient Has Alerts: Yes since last visit: Patient Alerts: NOT Has Dressing in Place as Prescribed: Yes Diabetic Pain Present Now: No Electronic Signature(s) Signed: 05/03/2019 12:02:54 PM By: Lorine Bears RCP, RRT, CHT Entered By: Lorine Bears on 05/03/2019 10:36:07 [name] ([id number]) -------------------------------------------------------------------------------- Clinic Level of Care Assessment Details Patient Name: [name] Date of Service: 05/03/2019 10:30 AM Medical Record Number: [id number] Patient Account Number: [id number] Date of Birth/Sex: 1950/09/19 (68 y.o. M) Treating RN: Montey Hora Primary Care Magdeline Prange: [name] Other Clinician: Referring Akilah Cureton: [name] Treating Bryson Gavia/Extender: [name], HOYT Weeks in Treatment: 86 Clinic Level of Care Assessment Items TOOL 4 Quantity Score []  - Use when only an EandM is performed on FOLLOW-UP visit 0 ASSESSMENTS - Nursing Assessment / Reassessment X - Reassessment of Co-morbidities (includes updates in patient status) 1 10 X- 1 5 Reassessment of Adherence to Treatment Plan ASSESSMENTS - Wound and Skin Assessment / Reassessment X - Simple Wound Assessment / Reassessment - one wound 1 5 []  - 0 Complex Wound Assessment / Reassessment - multiple wounds []  - 0 Dermatologic / Skin Assessment (not related to wound area) ASSESSMENTS - Focused Assessment []  - Circumferential Edema Measurements - multi extremities 0 []  - 0 Nutritional Assessment / Counseling / Intervention []  - 0 Lower Extremity Assessment (monofilament, tuning fork, pulses) []  - 0 Peripheral Arterial Disease Assessment (using hand held doppler) ASSESSMENTS - Ostomy and/or Continence Assessment and Care []  - Incontinence Assessment and Management 0 []  - 0 Ostomy Care Assessment and Management (repouching, etc.) PROCESS - Coordination of Care X - Simple Patient / Family Education for ongoing care 1 15 []  - 0 Complex (extensive) Patient / Family Education for ongoing care X- 1 10 Staff obtains Programmer, systems, Records, Test Results / Process Orders []  - 0 Staff telephones HHA, Nursing Homes / Clarify orders / etc []  - 0 Routine Transfer to another Facility (non-emergent condition) []  - 0 Routine Hospital Admission (non-emergent condition) []  - 0 New Admissions / Biomedical engineer / Ordering NPWT, Apligraf, etc. []  - 0 Emergency Hospital Admission (emergent condition) X- 1 10 Simple Discharge Coordination [name], [name]. ([id number]) []  - 0 Complex (extensive) Discharge Coordination PROCESS - Special Needs []  - Pediatric / Minor Patient Management 0 []  - 0 Isolation Patient Management []  - 0 Hearing / Language / Visual special needs []  -  0 Assessment of Community assistance (transportation, D/C planning, etc.) []  - 0 Additional assistance / Altered mentation []  - 0 Support Surface(s) Assessment (bed, cushion, seat, etc.) INTERVENTIONS - Wound Cleansing / Measurement X - Simple Wound Cleansing -  one wound 1 5 []  - 0 Complex Wound Cleansing - multiple wounds X- 1 5 Wound Imaging (photographs - any number of wounds) []  - 0 Wound Tracing (instead of photographs) X- 1 5 Simple Wound Measurement - one wound []  - 0 Complex Wound Measurement - multiple wounds INTERVENTIONS - Wound Dressings X - Small Wound Dressing one or multiple wounds 1 10 []  - 0 Medium Wound Dressing one or multiple wounds []  - 0 Large Wound Dressing one or multiple wounds []  - 0 Application of Medications - topical []  - 0 Application of Medications - injection INTERVENTIONS - Miscellaneous []  - External ear exam 0 []  - 0 Specimen Collection (cultures, biopsies, blood, body fluids, etc.) []  - 0 Specimen(s) / Culture(s) sent or taken to Lab for analysis []  - 0 Patient Transfer (multiple staff / Civil Service fast streamer / Similar devices) []  - 0 Simple Staple / Suture removal (25 or less) []  - 0 Complex Staple / Suture removal (26 or more) []  - 0 Hypo / Hyperglycemic Management (close monitor of Blood Glucose) []  - 0 Ankle / Brachial Index (ABI) - do not check if billed separately X- 1 5 Vital Signs Mcdonagh, Kaulder J. ([id number]) Has the patient been seen at the hospital within the last three years: Yes Total Score: 85 Level Of Care: New/Established - Level 3 Electronic Signature(s) Signed: 05/03/2019 5:32:28 PM By: Montey Hora Entered By: Montey Hora on 05/03/2019 11:06:30 [name] ([id number]) -------------------------------------------------------------------------------- Encounter Discharge Information Details Patient Name: [name] Date of Service: 05/03/2019 10:30 AM Medical Record Number: [id number] Patient  Account Number: [id number] Date of Birth/Sex: 1951-01-19 (68 y.o. M) Treating RN: Montey Hora Primary Care Capricia Serda: [name] Other Clinician: Referring Armari Fussell: [name] Treating Christna Kulick/Extender: [name], HOYT Weeks in Treatment: 17 Encounter Discharge Information Items Discharge Condition: Stable Ambulatory Status: Wheelchair Discharge Destination: Home Transportation: Private Auto Accompanied By: wife Schedule Follow-up Appointment: Yes Clinical Summary of Care: Electronic Signature(s) Signed: 05/03/2019 5:32:28 PM By: Montey Hora Entered By: Montey Hora on 05/03/2019 11:07:53 [name] ([id number]) -------------------------------------------------------------------------------- Lower Extremity Assessment Details Patient Name: [name] Date of Service: 05/03/2019 10:30 AM Medical Record Number: [id number] Patient Account Number: [id number] Date of Birth/Sex: 05-16-1951 (68 y.o. M) Treating RN: [name] Primary Care Hitoshi Werts: [name] Other Clinician: Referring Tristan Proto: [name] Treating Maleek Craver/Extender: Sharalyn Ink in Treatment: 56 Electronic Signature(s) Signed: 05/03/2019 5:21:17 PM By: Gretta Cool, BSN, RN, CWS, Kim RN, BSN Entered By: Gretta Cool, BSN, RN, CWS, Kim on 05/03/2019 10:48:27 [name], [name] ([id number]) -------------------------------------------------------------------------------- Multi Wound Chart Details Patient Name: [name] Date of Service: 05/03/2019 10:30 AM Medical Record Number: [id number] Patient Account Number: [id number] Date of Birth/Sex: 04-15-1951 (68 y.o. M) Treating RN: Montey Hora Primary Care Lilinoe Acklin: [name] Other Clinician: Referring Daylan Juhnke: [name] Treating Katty Fretwell/Extender: [name], HOYT Weeks in Treatment: 29 Vital Signs Height(in): 73 Pulse(bpm): 72 Weight(lbs): 210 Blood Pressure(mmHg): 122/64 Body Mass  Index(BMI): 28 Temperature(F): 97.6 Respiratory Rate 16 (breaths/min): Photos: [N/A:N/A] Wound Location: Right Gluteal fold N/A N/A Wounding Event: Pressure Injury N/A N/A Primary Etiology: Pressure Ulcer N/A N/A Comorbid History: Cataracts, Hypertension, N/A N/A Paraplegia Date Acquired: 11/02/2017 N/A N/A Weeks of Treatment: 74 N/A N/A Wound Status: Open N/A N/A Measurements L x W x D 3.8x1.7x2.4 N/A N/A (cm) Area (cm) : 5.074 N/A N/A Volume (cm) : 12.177 N/A N/A % Reduction in Area: 49.50% N/A N/A % Reduction in Volume: -1111.60% N/A N/A Position 1 (o'clock): 12 Maximum Distance 1 (cm):  4 Tunneling: Yes N/A N/A Classification: Category/Stage IV N/A N/A Exudate Amount: Large N/A N/A Exudate Type: Serous N/A N/A Exudate Color: amber N/A N/A Wound Margin: Epibole N/A N/A Granulation Amount: Medium (34-66%) N/A N/A Granulation Quality: Red, Pink, Hyper-granulation N/A N/A Necrotic Amount: Medium (34-66%) N/A N/A Exposed Structures: Fat Layer (Subcutaneous N/A N/A Tissue) Exposed: Yes Muscle: Yes Fascia: No [name], ARNESON. ([id number]) Tendon: No Joint: No Bone: No Epithelialization: Small (1-33%) N/A N/A Treatment Notes Electronic Signature(s) Signed: 05/03/2019 5:32:28 PM By: Montey Hora Entered By: Montey Hora on 05/03/2019 11:02:20 [name] ([id number]) -------------------------------------------------------------------------------- [place] Details Patient Name: [name] Date of Service: 05/03/2019 10:30 AM Medical Record Number: [id number] Patient Account Number: [id number] Date of Birth/Sex: [birth year] (68 y.o. M) Treating RN: Montey Hora Primary Care James Senn: [name] Other Clinician: Referring Henri Baumler: [name] Treating Najia Hurlbutt/Extender: [name], HOYT Weeks in Treatment: 73 Active Inactive Orientation to the Wound Care Program Nursing Diagnoses: Knowledge deficit related to  the wound healing center program Goals: Patient/caregiver will verbalize understanding of the [place] Program Date Initiated: 11/30/2017 Target Resolution Date: 12/21/2017 Goal Status: Active Interventions: Provide education on orientation to the wound center Notes: Pressure Nursing Diagnoses: Knowledge deficit related to management of pressures ulcers Goals: Patient/caregiver will verbalize understanding of pressure ulcer management Date Initiated: 05/17/2018 Target Resolution Date: 05/28/2018 Goal Status: Active Interventions: Assess: immobility, friction, shearing, incontinence upon admission and as needed Notes: Wound/Skin Impairment Nursing Diagnoses: Impaired tissue integrity Goals: Patient/caregiver will verbalize understanding of skin care regimen Date Initiated: 11/30/2017 Target Resolution Date: 12/21/2017 Goal Status: Active Ulcer/skin breakdown will have a volume reduction of 30% by week 4 Date Initiated: 11/30/2017 Target Resolution Date: 12/21/2017 Goal Status: Active [name], [name] ([id number]) Interventions: Assess patient/caregiver ability to obtain necessary supplies Assess patient/caregiver ability to perform ulcer/skin care regimen upon admission and as needed Assess ulceration(s) every visit Treatment Activities: Skin care regimen initiated : 11/30/2017 Notes: Electronic Signature(s) Signed: 05/03/2019 5:32:28 PM By: Montey Hora Entered By: Montey Hora on 05/03/2019 11:02:08 [name] ([id number]) -------------------------------------------------------------------------------- Pain Assessment Details Patient Name: [name] Date of Service: 05/03/2019 10:30 AM Medical Record Number: [id number] Patient Account Number: [id number] Date of Birth/Sex: [birth year] (68 y.o. M) Treating RN: Montey Hora Primary Care Jenniferlynn Saad: [name] Other Clinician: Referring Sole Lengacher: [name] Treating  Ireene Ballowe/Extender: [name], HOYT Weeks in Treatment: 50 Active Problems Location of Pain Severity and Description of Pain Patient Has Paino No Site Locations Pain Management and Medication Current Pain Management: Electronic Signature(s) Signed: 05/03/2019 12:02:54 PM By: Paulla Fore, RRT, CHT Signed: 05/03/2019 5:32:28 PM By: Montey Hora Entered By: Lorine Bears on 05/03/2019 10:36:14 [name], [name] ([id number]) -------------------------------------------------------------------------------- Patient/Caregiver Education Details Patient Name: [name] Date of Service: 05/03/2019 10:30 AM Medical Record Number: [id number] Patient Account Number: [id number] Date of Birth/Gender: [birth year] (68 y.o. M) Treating RN: Montey Hora Primary Care Physician: [name] Other Clinician: Referring Physician: [name] Treating Physician/Extender: Sharalyn Ink in Treatment: 36 Education Assessment Education Provided To: Patient and Caregiver Education Topics Provided Wound/Skin Impairment: Handouts: Other: wound care as ordered Methods: Demonstration, Explain/Verbal Responses: State content correctly Electronic Signature(s) Signed: 05/03/2019 5:32:28 PM By: Montey Hora Entered By: Montey Hora on 05/03/2019 11:06:56 [name] ([id number]) -------------------------------------------------------------------------------- Wound Assessment Details Patient Name: [name] Date of Service: 05/03/2019 10:30 AM Medical Record Number: [id number] Patient Account Number: [id number] Date of Birth/Sex: January 04, 1951 (68 y.o. M) Treating RN: [name] Primary Care  Viola Kinnick: [name] Other Clinician: Referring Jeevan Kalla: [name] Treating Adilson Grafton/Extender: [name], HOYT Weeks in Treatment: 74 Wound Status Wound Number: 1 Primary Etiology: Pressure Ulcer Wound Location: Right  Gluteal fold Wound Status: Open Wounding Event: Pressure Injury Comorbid History: Cataracts, Hypertension, Paraplegia Date Acquired: 11/02/2017 Weeks Of Treatment: 74 Clustered Wound: No Photos Wound Measurements Length: (cm) 3.8 Width: (cm) 1.7 Depth: (cm) 2.4 Area: (cm) 5.074 Volume: (cm) 12.177 % Reduction in Area: 49.5% % Reduction in Volume: -1111.6% Epithelialization: Small (1-33%) Tunneling: Yes Position (o'clock): 12 Maximum Distance: (cm) 4 Undermining: No Wound Description Classification: Category/Stage IV Wound Margin: Epibole Exudate Amount: Large Exudate Type: Serous Exudate Color: amber Foul Odor After Cleansing: No Slough/Fibrino Yes Wound Bed Granulation Amount: Medium (34-66%) Exposed Structure Granulation Quality: Red, Pink, Hyper-granulation Fascia Exposed: No Necrotic Amount: Medium (34-66%) Fat Layer (Subcutaneous Tissue) Exposed: Yes Necrotic Quality: Adherent Slough Tendon Exposed: No Muscle Exposed: Yes Necrosis of Muscle: No Joint Exposed: No DAVONTE, MACVICAR. ([id number]) Bone Exposed: No Treatment Notes Wound #1 (Right Gluteal fold) Notes zinc to peri wound, Hydrafera Blue, xtrasorb and Advertising account executive) Signed: 05/03/2019 5:21:17 PM By: Gretta Cool, BSN, RN, CWS, Kim RN, BSN Entered By: Gretta Cool, BSN, RN, CWS, Kim on 05/03/2019 10:47:59 MARVENS, BEAS ([id number]) -------------------------------------------------------------------------------- Vitals Details Patient Name: [name] Date of Service: 05/03/2019 10:30 AM Medical Record Number: [id number] Patient Account Number: [id number] Date of Birth/Sex: 10/01/1950 (68 y.o. M) Treating RN: Montey Hora Primary Care Rmani Kapusta: [name] Other Clinician: Referring Parminder Cupples: [name] Treating Giulietta Prokop/Extender: [name], HOYT Weeks in Treatment: 56 Vital Signs Time Taken: 10:20 Temperature (F): 97.6 Height (in): 73 Pulse (bpm):  72 Weight (lbs): 210 Respiratory Rate (breaths/min): 16 Body Mass Index (BMI): 27.7 Blood Pressure (mmHg): 122/64 Reference Range: 80 - 120 mg / dl Electronic Signature(s) Signed: 05/03/2019 12:02:54 PM By: Lorine Bears RCP, RRT, CHT Entered By: Lorine Bears on 05/03/2019 10:36:56

## 2019-05-04 ENCOUNTER — Other Ambulatory Visit: Payer: Self-pay

## 2019-05-04 ENCOUNTER — Encounter: Payer: Medicare Other | Admitting: Internal Medicine

## 2019-05-04 DIAGNOSIS — L89314 Pressure ulcer of right buttock, stage 4: Secondary | ICD-10-CM | POA: Diagnosis not present

## 2019-05-04 NOTE — Progress Notes (Signed)
[name], [name] ([id number]) Visit Report for 05/04/2019 Arrival Information Details Patient Name: [name], [name] Date of Service: 05/04/2019 8:00 AM Medical Record Number: [id number] Patient Account Number: [id number] Date of Birth/Sex: 1950-10-02 (68 y.o. M) Treating RN: [name] Primary Care Halayna Blane: [name] Other Clinician: [name] Referring Lanita Stammen: [name] Treating Kyi Romanello/Extender: [name] in Treatment: 58 Visit Information History Since Last Visit Added or deleted any medications: No Patient Arrived: Wheel Chair Any new allergies or adverse reactions: No Arrival Time: 08:00 Had a fall or experienced change in No Accompanied By: wife activities of daily living that may affect Transfer Assistance: Harrel Lemon Lift risk of falls: Patient Identification Verified: Yes Signs or symptoms of abuse/neglect since last visito No Secondary Verification Process Completed: Yes Hospitalized since last visit: No Patient Requires Transmission-Based No Implantable device outside of the clinic excluding No Precautions: cellular tissue based products placed in the center Patient Has Alerts: Yes since last visit: Patient Alerts: NOT Has Dressing in Place as Prescribed: Yes Diabetic Pain Present Now: No Electronic Signature(s) Signed: 05/04/2019 10:36:24 AM By: Lorine Bears RCP, RRT, CHT Entered By: Lorine Bears on 05/04/2019 08:30:07 [name], [name] ([id number]) -------------------------------------------------------------------------------- Encounter Discharge Information Details Patient Name: [name] Date of Service: 05/04/2019 8:00 AM Medical Record Number: [id number] Patient Account Number: [id number] Date of Birth/Sex: [birth year] (68 y.o. M) Treating RN: [name] Primary Care Mende Biswell: [name] Other Clinician: [name] Referring Cecia Egge: [name] Treating Atilla Zollner/Extender: [name] in Treatment: 22 Encounter Discharge Information Items Discharge Condition: Stable Ambulatory Status: Wheelchair Discharge Destination: Home Transportation: Private Auto Accompanied By: daughter, Joelene Millin Schedule Follow-up Appointment: Yes Clinical Summary of Care: Notes Patient has an HBO treatment scheduled on 05/05/2019 at 08:00 am. Electronic Signature(s) Signed: 05/04/2019 10:36:24 AM By: Lorine Bears RCP, RRT, CHT Entered By: Lorine Bears on 05/04/2019 10:29:44 [name], [name] ([id number]) -------------------------------------------------------------------------------- Vitals Details Patient Name: [name] Date of Service: 05/04/2019 8:00 AM Medical Record Number: [id number] Patient Account Number: [id number] Date of Birth/Sex: 10-20-1950 (68 y.o. M) Treating RN: [name] Primary Care Clarance Bollard: [name] Other Clinician: [name] Referring Javaris Wigington: [name] Treating Evangelos Paulino/Extender: [name] in Treatment: 14 Vital Signs Time Taken: 08:15 Temperature (F): 98.5 Height (in): 73 Pulse (bpm): 66 Weight (lbs): 210 Respiratory Rate (breaths/min): 16 Body Mass Index (BMI): 27.7 Blood Pressure (mmHg): 112/60 Reference Range: 80 - 120 mg / dl Electronic Signature(s) Signed: 05/04/2019 10:36:24 AM By: Lorine Bears RCP, RRT, CHT Entered By: Lorine Bears on 05/04/2019 08:30:38

## 2019-05-05 ENCOUNTER — Encounter: Payer: Medicare Other | Admitting: Physician Assistant

## 2019-05-05 DIAGNOSIS — L89314 Pressure ulcer of right buttock, stage 4: Secondary | ICD-10-CM | POA: Diagnosis not present

## 2019-05-05 NOTE — Progress Notes (Signed)
Brett, [name] ([id number]) Visit Report for 05/04/2019 HBO Details Patient Name: [name], [name] Date of Service: 05/04/2019 8:00 AM Medical Record Number: [id number] Patient Account Number: [id number] Date of Birth/Sex: [birth year] (68 y.o. 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 HBO Treatment Details Treatment Number: 13 Patient Type: Outpatient Chamber Type: Monoplace Chamber Serial #: [id number] Treatment Protocol: 2.0 ATA with 90 minutes oxygen, and no air breaks Treatment Details Compression Rate Down: 2.0 psi / minute De-Compression Rate Up: 2.0 psi / minute Compress Tx Pressure Air breaks and breathing periods Decompress Decompress Begins Reached (leave unused spaces blank) Begins Ends Chamber Pressure (ATA) 1 2 - - - - - - 2 1 Clock Time (24 hr) 08:20 08:34 - - - - - - 10:04 10:12 Treatment Length: 112 (minutes) Treatment Segments: 4 Vital Signs Capillary Blood Glucose Reference Range: 80 - 120 mg / dl HBO Diabetic Blood Glucose Intervention Range: <131 mg/dl or >249 mg/dl Time Vitals Blood Respiratory Capillary Blood Glucose Pulse Action Type: Pulse: Temperature: Taken: Pressure: Rate: Glucose (mg/dl): Meter #: Oximetry (%) Taken: Pre 08:15 112/60 66 16 98.5 Post 10:15 120/60 60 16 98 Treatment Response Treatment Completion Status: Treatment Completed without Adverse Event Sandar Krinke Notes No concerns with treatment given HBO Attestation I certify that I supervised this HBO treatment in accordance with Medicare guidelines. A trained emergency response  Yes team is readily available per hospital policies and procedures. Continue HBOT as ordered. Yes Electronic Signature(s) SHIRRELL, PRUETTE ([id number]) Signed: 05/04/2019 5:10:21 PM By: Linton Ham MD Previous Signature: 05/04/2019 10:36:24 AM Version By: Lorine Bears RCP, RRT, CHT Entered By: Linton Ham on 05/04/2019 12:20:51 JERID, ZACHRY ([id number]) -------------------------------------------------------------------------------- HBO Safety Checklist Details Patient Name: [name] Date of Service: 05/04/2019 8:00 AM Medical Record Number: [id number] Patient Account Number: [id number] Date of Birth/Sex: [birth year] (68 y.o. M) Treating RN: [name] Primary Care Mailynn Everly: [name] Other Clinician: [name] Referring Sonja Manseau: [name] Treating Najir Roop/Extender: [name] in Treatment: 86 HBO Safety Checklist Items Safety Checklist Consent Form Signed Patient voided / foley secured and emptied When did you last eato morning Last dose of injectable or oral agent n/a Ostomy pouch emptied and vented if applicable NA Medtronic Baclofen Pump; Nevro All implantable devices assessed, documented and approved Spinal Stimulator Intravenous access site secured and place NA Valuables secured Linens and cotton and cotton/polyester blend (less than 51% polyester) Personal oil-based products / skin lotions / body lotions removed Wigs or hairpieces removed NA Smoking or tobacco materials removed NA Books / newspapers / magazines / loose paper removed Cologne, aftershave, perfume and deodorant removed Jewelry removed (may wrap wedding band) Make-up removed NA Hair care products removed Battery operated devices (external) removed Heating patches and chemical warmers removed Titanium eyewear removed NA Nail polish cured greater than 10 hours NA Casting material cured greater than 10  hours NA Hearing aids removed NA Loose dentures or partials removed NA Prosthetics have been removed NA Patient demonstrates correct use of air break device (if applicable) Patient concerns have been addressed Patient grounding bracelet on and cord attached to chamber Specifics for Inpatients (complete in addition to above) Medication sheet sent with patient Intravenous medications needed  or due during therapy sent with patient Drainage tubes (e.g. nasogastric tube or chest tube secured and vented) Endotracheal or Tracheotomy tube secured Cuff deflated of air and inflated with saline Airway suctioned Electronic Signature(s) BOWIE, SCELSI ([id number]) Signed: 05/04/2019 10:36:24 AM By: Lorine Bears RCP, RRT, CHT Entered By: Lorine Bears on 05/04/2019 08:31:40

## 2019-05-05 NOTE — Progress Notes (Signed)
TAI, HENNESS ([id number]) Visit Report for 05/05/2019 Arrival Information Details Patient Name: [name], [name] Date of Service: 05/05/2019 8:00 AM Medical Record Number: [id number] Patient Account Number: [id number] Date of Birth/Sex: 11-02-1950 (68 y.o. M) Treating RN: [name] Primary Care Dekota Kirlin: [name] Other Clinician: [name] Referring Jaidev Sanger: [name] Treating Luiz Trumpower/Extender: [name], HOYT Weeks in Treatment: 34 Visit Information History Since Last Visit Added or deleted any medications: No Patient Arrived: Wheel Chair Any new allergies or adverse reactions: No Arrival Time: 08:00 Had a fall or experienced change in No Accompanied By: wife activities of daily living that may affect Transfer Assistance: Harrel Lemon Lift risk of falls: Patient Identification Verified: Yes Signs or symptoms of abuse/neglect since last visito No Secondary Verification Process Completed: Yes Hospitalized since last visit: No Patient Requires Transmission-Based No Implantable device outside of the clinic excluding No Precautions: cellular tissue based products placed in the center Patient Has Alerts: Yes since last visit: Patient Alerts: NOT Has Dressing in Place as Prescribed: Yes Diabetic Pain Present Now: No Electronic Signature(s) Signed: 05/05/2019 11:53:01 AM By: Lorine Bears RCP, RRT, CHT Entered By: Lorine Bears on 05/05/2019 08:10:07 [name], [name] ([id number]) -------------------------------------------------------------------------------- Encounter Discharge Information Details Patient Name: [name] Date of Service: 05/05/2019 8:00 AM Medical Record Number: [id number] Patient Account Number: [id number] Date of Birth/Sex: 29-Oct-1950 (68 y.o. M) Treating RN: [name] Primary Care Kohana Amble: [name] Other Clinician: [name] Referring Arval Brandstetter: [name] Treating Jagger Beahm/Extender: Worthy Keeler Weeks in Treatment: 71 Encounter Discharge Information Items Discharge Condition: Stable Ambulatory Status: Wheelchair Discharge Destination: Home Transportation: Private Auto Accompanied By: wife Schedule Follow-up Appointment: Yes Clinical Summary of Care: Notes Patient has an HBO treatment scheduled on Monday, 05/09/2019 at 08:00 am. Electronic Signature(s) Signed: 05/05/2019 11:53:01 AM By: Lorine Bears RCP, RRT, CHT Entered By: Lorine Bears on 05/05/2019 11:45:27 [name], [name] ([id number]) -------------------------------------------------------------------------------- Vitals Details Patient Name: [name] Date of Service: 05/05/2019 8:00 AM Medical Record Number: [id number] Patient Account Number: [id number] Date of Birth/Sex: November 07, 1950 (68 y.o. M) Treating RN: [name] Primary Care Felica Chargois: [name] Other Clinician: [name] Referring Gervis Gaba: [name] Treating Skyelar Swigart/Extender: [name], HOYT Weeks in Treatment: 19 Vital Signs Time Taken: 08:15 Temperature (F): 98.5 Height (in): 73 Pulse (bpm): 96 Weight (lbs): 210 Respiratory Rate (breaths/min): 16 Body Mass Index (BMI): 27.7 Blood Pressure (mmHg): 132/70 Reference Range: 80 - 120 mg / dl Electronic Signature(s) Signed: 05/05/2019 11:53:01 AM By: Lorine Bears RCP, RRT, CHT Entered By: Lorine Bears on 05/05/2019 08:36:06

## 2019-05-06 ENCOUNTER — Encounter: Payer: Medicare Other | Admitting: Physician Assistant

## 2019-05-06 NOTE — Progress Notes (Signed)
[name], [name] ([id number]) Visit Report for 05/05/2019 HBO Details Patient Name: [name], [name] Date of Service: 05/05/2019 8:00 AM Medical Record Number: [id number] Patient Account Number: [id number] Date of Birth/Sex: 03/13/1951 (68 y.o. 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 HBO Treatment Details Treatment Number: 14 Patient Type: Outpatient Chamber Type: Monoplace Chamber Serial #: [id number] Treatment Protocol: 2.0 ATA with 90 minutes oxygen, and no air breaks Treatment Details Compression Rate Down: 1.5 psi / minute De-Compression Rate Up: 2.0 psi / minute Compress Tx Pressure Air breaks and breathing periods Decompress Decompress Begins Reached (leave unused spaces blank) Begins Ends Chamber Pressure (ATA) 1 2 - - - - - - 2 1 Clock Time (24 hr) 08:23 08:35 - - - - - - 10:05 10:12 Treatment Length: 109 (minutes) Treatment Segments: 4 Vital Signs Capillary Blood Glucose Reference Range: 80 - 120 mg / dl HBO Diabetic Blood Glucose Intervention Range: <131 mg/dl or >249 mg/dl Time Vitals Blood Respiratory Capillary Blood Glucose Pulse Action Type: Pulse: Temperature: Taken: Pressure: Rate: Glucose (mg/dl): Meter #: Oximetry (%) Taken: Pre 08:15 132/70 96 16 98.5 Post 10:15 132/70 66 16 97.8 Treatment Response Treatment Completion Status: Treatment Completed without Adverse Event Electronic Signature(s) Signed: 05/05/2019 11:53:01 AM By: Lorine Bears RCP, RRT, CHT Signed: 05/05/2019 6:39:08 PM By: Worthy Keeler [name] Entered By: Lorine Bears on 05/05/2019 11:44:34 [name] ([id number]) -------------------------------------------------------------------------------- HBO Safety Checklist Details Patient Name: [name] Date of Service: 05/05/2019 8:00 AM Medical Record Number: [id number] Patient Account Number: [id number] Date of Birth/Sex: 1951/03/23 (68 y.o. M) Treating RN: [name] Primary Care Samule Life: [name] Other Clinician: [name] Referring Tara Rud: [name] Treating Alysiana Ethridge/Extender: [name], HOYT Weeks in Treatment: 34 HBO Safety Checklist Items Safety Checklist Consent Form Signed Patient voided / foley secured and emptied When did you last eato morning Last dose of injectable or oral agent n/a Ostomy pouch emptied and vented if applicable NA Medtronic Baclofen Pump; Nevro All implantable devices assessed, documented and approved Spinal Stimulator Intravenous access site secured and place NA Valuables secured Linens and cotton and cotton/polyester blend (less than 51% polyester) Personal oil-based products / skin lotions / body lotions removed Wigs or hairpieces removed NA Smoking or tobacco materials removed NA Books / newspapers / magazines / loose paper removed Cologne, aftershave, perfume and deodorant removed Jewelry removed (may wrap wedding band) Make-up removed NA Hair care products removed Battery operated devices (external) removed Heating patches and chemical warmers removed Titanium eyewear removed NA Nail polish cured greater than 10 hours NA Casting material cured greater than 10 hours NA Hearing aids removed NA Loose dentures or partials removed NA Prosthetics have been removed NA Patient demonstrates correct use of air break device (if applicable) Patient concerns have been addressed Patient grounding bracelet on and cord attached to chamber Specifics for Inpatients (complete in addition to  above) Medication sheet sent with patient Intravenous medications needed or due during therapy sent with patient Drainage tubes (e.g. nasogastric tube or chest tube secured and vented) Endotracheal or Tracheotomy tube secured Cuff deflated of air and inflated with saline Airway suctioned Electronic Signature(s) [name] ([id number]) Signed: 05/05/2019 11:53:01 AM By: Juleen China,  RCP,RRT,CHT, Sallie RCP, RRT, CHT Entered By: Lorine Bears on 05/05/2019 08:36:58

## 2019-05-09 ENCOUNTER — Other Ambulatory Visit: Payer: Self-pay

## 2019-05-09 ENCOUNTER — Encounter: Payer: Medicare Other | Admitting: Physician Assistant

## 2019-05-09 DIAGNOSIS — L89314 Pressure ulcer of right buttock, stage 4: Secondary | ICD-10-CM | POA: Diagnosis not present

## 2019-05-09 NOTE — Progress Notes (Signed)
[name], [name] ([id number]) Visit Report for 05/09/2019 Arrival Information Details Patient Name: [name], [name] Date of Service: 05/09/2019 8:00 AM Medical Record Number: [id number] Patient Account Number: [id number] Date of Birth/Sex: 02-Sep-1950 (68 y.o. M) Treating RN: Harold Barban Primary Care Amani Nodarse: [name] Other Clinician: [name] Referring Leinaala Catanese: [name] Treating Christon Parada/Extender: [name], HOYT Weeks in Treatment: 44 Visit Information History Since Last Visit Added or deleted any medications: No Patient Arrived: Wheel Chair Any new allergies or adverse reactions: No Arrival Time: 08:02 Had a fall or experienced change in No Accompanied By: wife activities of daily living that may affect Transfer Assistance: Harrel Lemon Lift risk of falls: Patient Identification Verified: Yes Signs or symptoms of abuse/neglect since last visito No Secondary Verification Process Completed: Yes Hospitalized since last visit: No Patient Requires Transmission-Based No Implantable device outside of the clinic excluding No Precautions: cellular tissue based products placed in the center Patient Has Alerts: Yes since last visit: Patient Alerts: NOT Has Dressing in Place as Prescribed: Yes Diabetic Pain Present Now: No Electronic Signature(s) Signed: 05/09/2019 11:21:48 AM By: Lorine Bears RCP, RRT, CHT Entered By: Lorine Bears on 05/09/2019 08:03:09 [name], [name] ([id number]) -------------------------------------------------------------------------------- Encounter Discharge Information Details Patient Name: [name] Date of Service: 05/09/2019 8:00 AM Medical Record Number: [id number] Patient Account Number: [id number] Date of Birth/Sex: 1950/10/17 (68 y.o. M) Treating RN: Harold Barban Primary Care Abisola Carrero: [name] Other Clinician: [name] Referring Kimela Malstrom: [name] Treating Alixandrea Milleson/Extender: [name], HOYT Weeks in Treatment: 40 Encounter Discharge Information Items Discharge Condition: Stable Ambulatory Status: Wheelchair Discharge Destination: Home Transportation: Private Auto Accompanied By: daughter, Joelene Millin Schedule Follow-up Appointment: Yes Clinical Summary of Care: Notes Patient has an HBO treatment scheduled on 05/10/2019 at 08:00 am. Electronic Signature(s) Signed: 05/09/2019 11:21:48 AM By: Lorine Bears RCP, RRT, CHT Entered By: Lorine Bears on 05/09/2019 11:16:07 [name], [name] ([id number]) -------------------------------------------------------------------------------- Vitals Details Patient Name: [name] Date of Service: 05/09/2019 8:00 AM Medical Record Number: [id number] Patient Account Number: [id number] Date of Birth/Sex: 1951-01-30 (68 y.o. M) Treating RN: Harold Barban Primary Care Laparis Durrett: [name] Other Clinician: [name] Referring Blade Scheff: [name] Treating Shaune Malacara/Extender: [name], HOYT Weeks in Treatment: 44 Vital Signs Time Taken: 08:15 Temperature (F): 97.5 Height (in): 73 Pulse (bpm): 78 Weight (lbs): 210 Respiratory Rate (breaths/min): 16 Body Mass Index (BMI): 27.7 Blood Pressure (mmHg): 122/74 Reference Range: 80 - 120 mg / dl Electronic Signature(s) Signed: 05/09/2019 11:21:48 AM By: Lorine Bears RCP, RRT, CHT Entered By: Lorine Bears on 05/09/2019 08:31:44

## 2019-05-10 ENCOUNTER — Encounter: Payer: Self-pay | Admitting: General Surgery

## 2019-05-10 ENCOUNTER — Encounter: Payer: Medicare Other | Admitting: Physician Assistant

## 2019-05-10 ENCOUNTER — Ambulatory Visit (INDEPENDENT_AMBULATORY_CARE_PROVIDER_SITE_OTHER): Payer: Medicare Other | Admitting: General Surgery

## 2019-05-10 VITALS — BP 138/80 | HR 102 | Temp 97.3°F | Wt 210.0 lb

## 2019-05-10 DIAGNOSIS — L89154 Pressure ulcer of sacral region, stage 4: Secondary | ICD-10-CM

## 2019-05-10 DIAGNOSIS — L89314 Pressure ulcer of right buttock, stage 4: Secondary | ICD-10-CM | POA: Diagnosis not present

## 2019-05-10 NOTE — Progress Notes (Signed)
[name], [name] ([id number]) Visit Report for 05/10/2019 Arrival Information Details Patient Name: [name], [name] Date of Service: 05/10/2019 8:00 AM Medical Record Number: [id number] Patient Account Number: [id number] Date of Birth/Sex: 1951-01-12 (68 y.o. M) Treating RN: Montey Hora Primary Care Naftuli Dalsanto: [name] Other Clinician: [name] Referring Noha Karasik: [name] Treating Coretha Creswell/Extender: [name], HOYT Weeks in Treatment: 32 Visit Information History Since Last Visit Added or deleted any medications: No Patient Arrived: Wheel Chair Any new allergies or adverse reactions: No Arrival Time: 08:21 Had a fall or experienced change in No Accompanied By: wife activities of daily living that may affect Transfer Assistance: Harrel Lemon Lift risk of falls: Patient Identification Verified: Yes Signs or symptoms of abuse/neglect since last visito No Secondary Verification Process Completed: Yes Hospitalized since last visit: No Patient Requires Transmission-Based No Implantable device outside of the clinic excluding No Precautions: cellular tissue based products placed in the center Patient Has Alerts: Yes since last visit: Patient Alerts: NOT Has Dressing in Place as Prescribed: Yes Diabetic Pain Present Now: No Electronic Signature(s) Signed: 05/10/2019 11:03:36 AM By: Lorine Bears RCP, RRT, CHT Entered By: Lorine Bears on 05/10/2019 08:21:59 [name], [name] ([id number]) -------------------------------------------------------------------------------- Encounter Discharge Information Details Patient Name: [name] Date of Service: 05/10/2019 8:00 AM Medical Record Number: [id number] Patient Account Number: [id number] Date of Birth/Sex: 1950-11-17 (68 y.o. M) Treating RN: Montey Hora Primary Care Tyreke Kaeser: [name] Other Clinician: [name] Referring Alexsis Branscom: [name] Treating Tiandra Swoveland/Extender: [name], HOYT Weeks in Treatment: 96 Encounter Discharge Information Items Discharge Condition: Stable Ambulatory Status: Wheelchair Discharge Destination: Home Transportation: Private Auto Accompanied By: wife Schedule Follow-up Appointment: Yes Clinical Summary of Care: Notes Patient has an HBO appointment scheduled on 05/11/2019 at 08:15 am. Electronic Signature(s) Signed: 05/10/2019 11:03:36 AM By: Lorine Bears RCP, RRT, CHT Entered By: Lorine Bears on 05/10/2019 11:03:14 [name], [name] ([id number]) -------------------------------------------------------------------------------- Vitals Details Patient Name: [name] Date of Service: 05/10/2019 8:00 AM Medical Record Number: [id number] Patient Account Number: [id number] Date of Birth/Sex: 1950-11-26 (68 y.o. M) Treating RN: Montey Hora Primary Care Zamarah Ullmer: [name] Other Clinician: [name] Referring Alverta Caccamo: [name] Treating Brynnan Rodenbaugh/Extender: [name], HOYT Weeks in Treatment: 67 Vital Signs Time Taken: 08:10 Temperature (F): 98.4 Height (in): 73 Pulse (bpm): 78 Weight (lbs): 210 Respiratory Rate (breaths/min): 16 Body Mass Index (BMI): 27.7 Blood Pressure (mmHg): 112/68 Reference Range: 80 - 120 mg / dl Electronic Signature(s) Signed: 05/10/2019 11:03:36 AM By: Lorine Bears RCP, RRT, CHT Entered By: Lorine Bears on 05/10/2019 08:22:46

## 2019-05-10 NOTE — Progress Notes (Addendum)
[name] ([id number]) Visit Report for 05/09/2019 HBO Details Patient Name: [name], [name] Date of Service: 05/09/2019 8:00 AM Medical Record Number: [id number] Patient Account Number: [id number] Date of Birth/Sex: 1951/02/25 (68 y.o. 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 HBO Treatment Details Treatment Number: 15 Patient Type: Outpatient Chamber Type: Monoplace Chamber Serial #: [id number] Treatment Protocol: 2.0 ATA with 90 minutes oxygen, and no air breaks Treatment Details Compression Rate Down: 1.5 psi / minute De-Compression Rate Up: 1.5 psi / minute Compress Tx Pressure Air breaks and breathing periods Decompress Decompress Begins Reached (leave unused spaces blank) Begins Ends Chamber Pressure (ATA) 1 2 - - - - - - 2 1 Clock Time (24 hr) 08:19 08:30 - - - - - - 10:00 10:10 Treatment Length: 111 (minutes) Treatment Segments: 4 Vital Signs Capillary Blood Glucose Reference Range: 80 - 120 mg / dl HBO Diabetic Blood Glucose Intervention Range: <131 mg/dl or >249 mg/dl Time Vitals Blood Respiratory Capillary Blood Glucose Pulse Action Type: Pulse: Temperature: Taken: Pressure: Rate: Glucose (mg/dl): Meter #: Oximetry (%) Taken: Pre 08:15 122/74 78 16 97.5 Post 10:15 132/70 60 16 98.2 Treatment Response Treatment Completion Status: Treatment Completed without Adverse Event HBO Attestation I certify that I supervised this HBO treatment in accordance with Medicare guidelines. A trained emergency response Yes team is readily available per hospital  policies and procedures. Continue HBOT as ordered. Yes Electronic Signature(s) Signed: 05/10/2019 7:03:10 PM By: Worthy Keeler [name] Previous Signature: 05/09/2019 11:21:48 AM Version By: Paulla Fore, RRT, CHT ARDARIUS, HAASCH ([id number]) Previous Signature: 05/09/2019 6:08:25 PM Version By: Worthy Keeler [name] Entered By: Worthy Keeler on 05/10/2019 19:03:10 RONELL, CARLON ([id number]) -------------------------------------------------------------------------------- HBO Safety Checklist Details Patient Name: [name] Date of Service: 05/09/2019 8:00 AM Medical Record Number: [id number] Patient Account Number: [id number] Date of Birth/Sex: March 12, 1951 (68 y.o. M) Treating RN: Harold Barban Primary Care Dorlisa Savino: [name] Other Clinician: [name] Referring Gentle Hoge: [name] Treating Kenyada Hy/Extender: [name], HOYT Weeks in Treatment: 75 HBO Safety Checklist Items Safety Checklist Consent Form Signed Patient voided / foley secured and emptied When did you last eato morning Last dose of injectable or oral agent n/a Ostomy pouch emptied and vented if applicable NA Medtronic Baclofen Pump; Nevro All implantable devices assessed, documented and approved Spinal Stimulator Intravenous access site secured and place NA Valuables secured Linens and cotton and cotton/polyester blend (less than 51% polyester) Personal oil-based products / skin lotions / body lotions removed Wigs or hairpieces removed NA Smoking or tobacco materials removed NA Books / newspapers / magazines / loose paper removed Cologne, aftershave, perfume and deodorant removed Jewelry removed (may wrap wedding band) Make-up removed NA Hair care products removed Battery operated devices (external) removed Heating patches and chemical warmers removed Titanium eyewear removed NA Nail polish cured greater than 10 hours NA Casting material  cured greater than 10 hours NA Hearing aids removed NA Loose dentures or partials removed NA Prosthetics have been removed NA Patient demonstrates correct use of air break device (if applicable) Patient concerns have been addressed Patient grounding bracelet on and cord attached to chamber Specifics for Inpatients (complete in addition to above) Medication sheet  sent with patient Intravenous medications needed or due during therapy sent with patient Drainage tubes (e.g. nasogastric tube or chest tube secured and vented) Endotracheal or Tracheotomy tube secured Cuff deflated of air and inflated with saline Airway suctioned Electronic Signature(s) HILAIRE, EGERT ([id number]) Signed: 05/09/2019 11:21:48 AM By: Lorine Bears RCP, RRT, CHT Entered By: Lorine Bears on 05/09/2019 08:34:31

## 2019-05-10 NOTE — Progress Notes (Signed)
Patient ID: [name], male   DOB: 11/24/1950, 68 y.o.   MRN: [id number]  Chief Complaint  Patient presents with  . Follow-up    ulcer    HPI [name] is a 68 y.o. male.   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  I saw him in December 2019, when he presented with an infected sacral decubitus ulcer.  This was debrided and a bone biopsy obtained, that was consistent with osteomyelitis.  Upon discharge from the hospital, he went to a skilled nursing facility/rehabilitation center.  He had IV antibiotics for his osteomyelitis.  He remained in the rehabilitation center for about 100 days.  He i has continued to receive wound care for [name], [place].  He has been receiving hyperbaric oxygen therapy and is on oral levofloxacin.  A recent bone scan suggested persistent osteomyelitis and Mr. [name] was referred for reevaluation in my clinic.  He has not had any fevers or chills.  No nausea or vomiting.   Past Medical History:  Diagnosis Date  . AVM (arteriovenous malformation) spine   . Cancer [facility])    prostate, taking Casodex (hormone chemotherapy)  . History of kidney stones    several times  . Hypertension   . 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 Prostate cancer ([place])   . Status post insertion of intrathecal baclofen pump   . Status post insertion of spinal cord stimulator     Past Surgical History:  Procedure Laterality Date  . COLONOSCOPY    . CYSTOSCOPY     several times  . CYSTOSCOPY WITH DIRECT VISION INTERNAL URETHROTOMY  05/05/2017   Procedure: CYSTOSCOPY WITH DIRECT VISION INTERNAL URETHROTOMY;  Surgeon: Royston Cowper, MD;  Location: [facility];  Service: Urology;;  . Consuela Mimes WITH LITHOLAPAXY N/A 05/05/2017   Procedure: CYSTOSCOPY WITH LITHOLAPAXY;  Surgeon: Royston Cowper, MD;  Location: [facility];  Service: Urology;  Laterality: N/A;  . EXTRACORPOREAL SHOCK WAVE LITHOTRIPSY    . EYE SURGERY Bilateral    cataract  surgery  . HOLMIUM LASER APPLICATION N/A [id number]   Procedure: HOLMIUM LASER APPLICATION;  Surgeon: Royston Cowper, MD;  Location: [facility];  Service: Urology;  Laterality: N/A;  . [place]  . LUMBAR [place] SURGERY  2005   resulted in paralysis of lower extrems  . SKIN DEBRIDEMENT Right 07/15/2018   Procedure: DEBRIDEMENT SKIN FULL THICKNESS--stage 4 decubitus ulcer;  Surgeon: Fredirick Maudlin, MD;  Location: [facility];  Service: General;  Laterality: Right;    No family history on file.  Social History Social History   Tobacco Use  . Smoking status: Never Smoker  . Smokeless tobacco: Former Systems developer    Types: Snuff  Substance Use Topics  . Alcohol use: No  . Drug use: No    Allergies  Allergen Reactions  . Clindamycin Hives  . Doxycycline Rash  . Penicillins Rash  . Sulfamethoxazole Rash    Current Outpatient Medications  Medication Sig Dispense Refill  . acetaminophen (TYLENOL) 500 MG tablet Take 1,000 mg by mouth 2 (two) times daily.    . baclofen (LIORESAL) 10 MG tablet Take 10 mg by mouth daily as needed for muscle spasms.     . bicalutamide (CASODEX) 50 MG tablet Take 50 mg by mouth daily.    Marland Kitchen gabapentin (NEURONTIN) 800 MG tablet Take 1 tablet (800 mg total) by mouth 3 (three) times daily. 90 tablet 0  . levofloxacin (LEVAQUIN)  750 MG tablet Take 750 mg by mouth daily.    Marland Kitchen losartan (COZAAR) 100 MG tablet Take 100 mg by mouth daily.    . methenamine (HIPREX) 1 g tablet Take 1 g by mouth 3 (three) times daily.     . mirabegron ER (MYRBETRIQ) 50 MG TB24 tablet Take 50 mg by mouth daily.     . nutrition supplement, JUVEN, (JUVEN) PACK Take 1 packet by mouth 2 (two) times daily between meals. 30 packet 0  . oxybutynin (DITROPAN-XL) 10 MG 24 hr tablet Take 10 mg by mouth every morning.     Marland Kitchen PAIN MANAGEMENT INTRATHECAL, IT, PUMP 1 each by Intrathecal route continuous. Intrathecal (IT) medication:  Baclofen, Dilaudid    . polyethylene glycol powder  (GLYCOLAX/MIRALAX) powder Take 17 g by mouth daily.     Marland Kitchen Propylene Glycol (SYSTANE BALANCE) 0.6 % SOLN Place 1 drop into both eyes daily as needed (dry eyes).    . vitamin C (ASCORBIC ACID) 500 MG tablet Take 1,000 mg by mouth 3 (three) times daily.      No current facility-administered medications for this visit.     Review of Systems Review of Systems  All other systems reviewed and are negative.   Blood pressure 138/80, pulse (!) 102, temperature (!) 97.3 F (36.3 C), weight 210 lb (95.3 kg), SpO2 96 %.  Physical Exam Physical Exam Vitals signs reviewed. Exam conducted with a chaperone present.  HENT:     Head: Normocephalic and atraumatic.     Nose:     Comments: Covered with a mask secondary to COVID-19 precautions    Mouth/Throat:     Comments: Covered with a mask secondary to COVID-19 precautions Eyes:     General: No scleral icterus.       Right eye: No discharge.        Left eye: No discharge.  Cardiovascular:     Rate and Rhythm: Normal rate and regular rhythm.  Pulmonary:     Effort: Pulmonary effort is normal. No respiratory distress.  Abdominal:     General: Abdomen is flat.     Palpations: Abdomen is soft.  Genitourinary:      Comments: The wound is markedly decreased in size from the last time I saw him, February 2020.  There is slight undermining at the cranial aspect but robust and beefy granulation tissue present.  Bone is no longer palpable at the base.  There is no foul odor or drainage appreciated.    Data Reviewed I reviewed a CT scan of the pelvis performed in June 2020, as well as a nuclear medicine bone scan performed in September 2020.  Both of these suggest the presence of osteomyelitis.  Assessment This is a 68 year old paraplegic male who underwent debridement of a pressure ulcer in December 2019.  He has continued to receive local wound care after completing a course of IV antibiotics for osteomyelitis.  He is also receiving hyperbaric  oxygen therapy.  Overall, the wound appears healthy and continues to heal.  He does have evidence of osteomyelitis on recent imaging, however this appears to likely be chronic due to the long duration.  Plan No surgical debridement is necessary at this time.  He should continue his current wound therapy.  I will defer to Dr. Delaine Lame for any alterations in his antibiotic regimen.  I will see him back on an as-needed basis.    Fredirick Maudlin 05/10/2019, 4:11 PM

## 2019-05-10 NOTE — Patient Instructions (Addendum)
Follow-up with our office as needed.  Please call and ask to speak with a nurse if you develop questions or concerns.  We will send the notes to Dr Delaine Lame.

## 2019-05-11 ENCOUNTER — Encounter: Payer: Medicare Other | Admitting: Internal Medicine

## 2019-05-11 ENCOUNTER — Other Ambulatory Visit: Payer: Self-pay

## 2019-05-11 DIAGNOSIS — L89314 Pressure ulcer of right buttock, stage 4: Secondary | ICD-10-CM | POA: Diagnosis not present

## 2019-05-11 NOTE — Progress Notes (Signed)
SELDEN, ROMO ([id number]) Visit Report for 05/10/2019 Problem List Details Patient Name: [name], [name] Date of Service: 05/10/2019 8:00 AM Medical Record Number: [id number] Patient Account Number: [id number] Date of Birth/Sex: Mar 03, 1951 (68 y.o. 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, sequela I10  Essential (primary) hypertension Facility Procedures CPT4 Code: [id number] Description: (Facility Use Only) HBOT, full body chamber, 70min Modifier: Quantity: 4 Physician Procedures CPT4 Code: [id number] Description: [id number] - WC PHYS HYPERBARIC OXYGEN THERAPY ICD-10 Diagnosis Description M86.68 Other chronic osteomyelitis, other site L89.314 Pressure ulcer of right buttock, stage 4 G82.20 Paraplegia, unspecified S34.109S Unspecified injury to unspecified  level of lumbar spinal cord, Modifier: sequela Quantity: 1 Electronic Signature(s) Signed: 05/10/2019 7:01:18 PM By: Worthy Keeler [name] Previous Signature: 05/10/2019 11:03:36 AM Version By: Lorine Bears RCP, RRT, CHT Entered By: Worthy Keeler on 05/10/2019 19:01:17

## 2019-05-11 NOTE — Progress Notes (Signed)
GEARLD, LEMERY ([id number]) Visit Report for 05/09/2019 Problem List Details Patient Name: [name], [name] Date of Service: 05/09/2019 8:00 AM Medical Record Number: [id number] Patient Account Number: [id number] Date of Birth/Sex: [birth year] (68 y.o. 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, sequela I10  Essential (primary) hypertension Facility Procedures CPT4 Code: [id number] Description: (Facility Use Only) HBOT, full body chamber, 28min Modifier: Quantity: 4 Physician Procedures CPT4 Code: [id number] Description: [id number] - WC PHYS HYPERBARIC OXYGEN THERAPY ICD-10 Diagnosis Description M86.68 Other chronic osteomyelitis, other site L89.314 Pressure ulcer of right buttock, stage 4 L03.317 Cellulitis of buttock G82.20 Paraplegia, unspecified Modifier: Quantity: 1 Electronic Signature(s) Signed: 05/10/2019 7:03:14 PM By: Worthy Keeler [name] Previous Signature: 05/09/2019 11:21:48 AM Version By: Lorine Bears RCP, RRT, CHT Previous Signature: 05/09/2019 6:08:25 PM Version By: Worthy Keeler [name] Entered By: Worthy Keeler on 05/10/2019 19:03:14

## 2019-05-11 NOTE — Progress Notes (Signed)
[name], [name] ([id number]) Visit Report for 05/10/2019 HBO Details Patient Name: [name], [name] Date of Service: 05/10/2019 8:00 AM Medical Record Number: [id number] Patient Account Number: [id number] Date of Birth/Sex: 1950-10-25 (68 y.o. 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 HBO Treatment Details Treatment Number: 16 Patient Type: Outpatient Chamber Type: Monoplace Chamber Serial #: [id number] Treatment Protocol: 2.0 ATA with 90 minutes oxygen, and no air breaks Treatment Details Compression Rate Down: 1.5 psi / minute De-Compression Rate Up: 2.0 psi / minute Compress Tx Pressure Air breaks and breathing periods Decompress Decompress Begins Reached (leave unused spaces blank) Begins Ends Chamber Pressure (ATA) 1 2 - - - - - - 2 1 Clock Time (24 hr) 08:13 08:24 - - - - - - 10:54 10:04 Treatment Length: 111 (minutes) Treatment Segments: 4 Vital Signs Capillary Blood Glucose Reference Range: 80 - 120 mg / dl HBO Diabetic Blood Glucose Intervention Range: <131 mg/dl or >249 mg/dl Time Vitals Blood Respiratory Capillary Blood Glucose Pulse Action Type: Pulse: Temperature: Taken: Pressure: Rate: Glucose (mg/dl): Meter #: Oximetry (%) Taken: Pre 08:10 112/68 78 16 98.4 Post 10:20 120/70 66 16 98.3 Treatment Response Treatment Toleration: Well Treatment Completion Treatment Completed without Adverse Event Status: HBO Attestation I certify that I supervised this HBO treatment in accordance with Medicare guidelines. A trained emergency response Yes team is readily  available per hospital policies and procedures. Continue HBOT as ordered. Yes Electronic Signature(s) JAQUARIS, MASCARENAS ([id number]) Signed: 05/10/2019 7:01:06 PM By: Worthy Keeler [name] Previous Signature: 05/10/2019 11:03:36 AM Version By: Lorine Bears RCP, RRT, CHT Entered By: Worthy Keeler on 05/10/2019 19:01:06 [name], [name] ([id number]) -------------------------------------------------------------------------------- HBO Safety Checklist Details Patient Name: [name] Date of Service: 05/10/2019 8:00 AM Medical Record Number: [id number] Patient Account Number: [id number] Date of Birth/Sex: Jan 16, 1951 (68 y.o. M) Treating RN: Montey Hora Primary Care Champagne Paletta: [name] Other Clinician: [name] Referring Clements Toro: [name] Treating Aleria Maheu/Extender: [name], HOYT Weeks in Treatment: 75 HBO Safety Checklist Items Safety Checklist Consent Form Signed Patient voided / foley secured and emptied When did you last eato morning Last dose of injectable or oral agent n/a Ostomy pouch emptied and vented if applicable NA Medtronic Baclofen Pump; Nevro All implantable devices assessed, documented and approved Spinal Stimulator Intravenous access site secured and place NA Valuables secured Linens and cotton and cotton/polyester blend (less than 51% polyester) Personal oil-based products / skin lotions / body lotions removed Wigs or hairpieces removed NA Smoking or tobacco materials removed NA Books / newspapers / magazines / loose paper removed Cologne, aftershave, perfume and deodorant removed Jewelry removed (may wrap wedding band) Make-up removed NA Hair care products removed Battery operated devices (external) removed Heating patches and chemical warmers removed Titanium eyewear removed NA Nail polish cured greater than 10 hours NA Casting material cured greater than 10 hours NA Hearing aids  removed NA Loose dentures or partials removed NA Prosthetics have been removed NA Patient demonstrates correct use of air break device (if applicable) Patient concerns have been addressed Patient grounding bracelet on and cord attached to chamber Specifics for Inpatients (complete in addition to above) Medication sheet sent with patient Intravenous medications needed or due  during therapy sent with patient Drainage tubes (e.g. nasogastric tube or chest tube secured and vented) Endotracheal or Tracheotomy tube secured Cuff deflated of air and inflated with saline Airway suctioned Electronic Signature(s) ISAIAS, BOZARD ([id number]) Signed: 05/10/2019 11:03:36 AM By: Lorine Bears RCP, RRT, CHT Entered By: Lorine Bears on 05/10/2019 08:23:40

## 2019-05-12 ENCOUNTER — Encounter: Payer: Medicare Other | Admitting: Physician Assistant

## 2019-05-12 DIAGNOSIS — L89314 Pressure ulcer of right buttock, stage 4: Secondary | ICD-10-CM | POA: Diagnosis not present

## 2019-05-12 NOTE — Progress Notes (Signed)
Brett, [name] ([id number]) Visit Report for 05/11/2019 HBO Details Patient Name: Brett, [name] Date of Service: 05/11/2019 8:00 AM Medical Record Number: [id number] Patient Account Number: [id number] Date of Birth/Sex: [birth year] (68 y.o. 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 HBO Treatment Details Treatment Number: 17 Patient Type: Outpatient Chamber Type: Monoplace Chamber Serial #: [id number] Treatment Protocol: 2.0 ATA with 90 minutes oxygen, and no air breaks Treatment Details Compression Rate Down: 1.5 psi / minute De-Compression Rate Up: 2.0 psi / minute Compress Tx Pressure Air breaks and breathing periods Decompress Decompress Begins Reached (leave unused spaces blank) Begins Ends Chamber Pressure (ATA) 1 2 - - - - - - 2 1 Clock Time (24 hr) 08:14 08:25 - - - - - - 09:56 10:04 Treatment Length: 110 (minutes) Treatment Segments: 4 Vital Signs Capillary Blood Glucose Reference Range: 80 - 120 mg / dl HBO Diabetic Blood Glucose Intervention Range: <131 mg/dl or >249 mg/dl Time Vitals Blood Respiratory Capillary Blood Glucose Pulse Action Type: Pulse: Temperature: Taken: Pressure: Rate: Glucose (mg/dl): Meter #: Oximetry (%) Taken: Pre 08:10 118/72 66 16 98.4 Post 10:07 118/66 66 16 97.9 Treatment Response Treatment Completion Status: Treatment Completed without Adverse Event [name] Notes No concerns with treatment given HBO Attestation I certify that I supervised this HBO treatment in accordance with Medicare guidelines. A trained emergency response  Yes team is readily available per hospital policies and procedures. Continue HBOT as ordered. Yes Electronic Signature(s) Brett, [name] ([id number]) Signed: 05/11/2019 5:06:38 PM By: Linton Ham MD Previous Signature: 05/11/2019 10:49:04 AM Version By: Lorine Bears RCP, RRT, CHT Entered By: Linton Ham on 05/11/2019 17:05:06 Brett, [name] ([id number]) -------------------------------------------------------------------------------- HBO Safety Checklist Details Patient Name: [name] Date of Service: 05/11/2019 8:00 AM Medical Record Number: [id number] Patient Account Number: [id number] Date of Birth/Sex: August 04, 1950 (68 y.o. M) Treating RN: [name] Primary Care [name]: [name] Other Clinician: [name] Referring [name]: [name] [name] in Treatment: 75 HBO Safety Checklist Items Safety Checklist Consent Form Signed Patient voided / foley secured and emptied When did you last eato morning Last dose of injectable or oral agent n/a Ostomy pouch emptied and vented if applicable NA Medtronic Baclofen Pump; Nevro All implantable devices assessed, documented and approved Spinal Stimulator Intravenous access site secured and place NA Valuables secured Linens and cotton and cotton/polyester blend (less than 51% polyester) Personal oil-based products / skin lotions / body lotions removed Wigs or hairpieces removed NA Smoking or tobacco materials removed NA Books / newspapers / magazines / loose paper removed Cologne, aftershave, perfume and deodorant removed Jewelry removed (may wrap wedding band) Make-up removed NA Hair care products removed Battery operated devices (external) removed Heating patches and chemical warmers removed Titanium eyewear removed NA Nail polish cured greater than 10 hours NA Casting material cured greater than 10  hours NA Hearing aids removed NA Loose dentures or partials removed NA Prosthetics have been removed NA Patient demonstrates correct use of air break device (if applicable) Patient concerns have been addressed Patient grounding bracelet on and cord attached to chamber Specifics for Inpatients (complete in addition to above) Medication sheet sent with patient Intravenous medications needed  or due during therapy sent with patient Drainage tubes (e.g. nasogastric tube or chest tube secured and vented) Endotracheal or Tracheotomy tube secured Cuff deflated of air and inflated with saline Airway suctioned Electronic Signature(s) JUVENCIO, HALVORSON ([id number]) Signed: 05/11/2019 10:49:04 AM By: Lorine Bears RCP, RRT, CHT Entered By: Becky Sax, Amado Nash on 05/11/2019 08:28:35

## 2019-05-12 NOTE — Progress Notes (Signed)
[name] ([id number]) Visit Report for 05/12/2019 Arrival Information Details Patient Name: [name] Date of Service: 05/12/2019 10:30 AM Medical Record Number: [id number] Patient Account Number: [id number] Date of Birth/Sex: Jul 21, 1951 (68 y.o. M) Treating RN: Harold Barban Primary Care Vista Sawatzky: [name] Other Clinician: Referring Majd Tissue: [name] Treating Coltin Casher/Extender: Suella [place] in Treatment: 75 Visit Information History Since Last Visit Added or deleted any medications: No Patient Arrived: Wheel Chair Any new allergies or adverse reactions: No Arrival Time: 10:33 Had a fall or experienced change in No Accompanied By: wife activities of daily living that may affect Transfer Assistance: EasyPivot Patient risk of falls: Lift Signs or symptoms of abuse/neglect since last visito No Patient Identification Verified: Yes Hospitalized since last visit: No Secondary Verification Process Yes Has Dressing in Place as Prescribed: Yes Completed: Pain Present Now: No Patient Requires Transmission-Based No Precautions: Patient Has Alerts: Yes Patient Alerts: NOT Diabetic Electronic Signature(s) Signed: 05/12/2019 4:05:35 PM By: Harold Barban Entered By: Harold Barban on 05/12/2019 10:33:38 [name] ([id number]) -------------------------------------------------------------------------------- Clinic Level of Care Assessment Details Patient Name: [name] Date of Service: 05/12/2019 10:30 AM Medical Record Number: [id number] Patient Account Number: [id number] Date of Birth/Sex: 1950-09-21 (68 y.o. M) Treating RN: Army Melia Primary Care Shalisa Mcquade: [name] Other Clinician: Referring Jazline Cumbee: [name] Treating Neo Yepiz/Extender: [name], HOYT Weeks in Treatment: 75 Clinic Level of Care Assessment Items TOOL 4 Quantity Score []  - Use when only an EandM is performed on FOLLOW-UP visit  0 ASSESSMENTS - Nursing Assessment / Reassessment X - Reassessment of Co-morbidities (includes updates in patient status) 1 10 X- 1 5 Reassessment of Adherence to Treatment Plan ASSESSMENTS - Wound and Skin Assessment / Reassessment X - Simple Wound Assessment / Reassessment - one wound 1 5 []  - 0 Complex Wound Assessment / Reassessment - multiple wounds []  - 0 Dermatologic / Skin Assessment (not related to wound area) ASSESSMENTS - Focused Assessment []  - Circumferential Edema Measurements - multi extremities 0 []  - 0 Nutritional Assessment / Counseling / Intervention []  - 0 Lower Extremity Assessment (monofilament, tuning fork, pulses) []  - 0 Peripheral Arterial Disease Assessment (using hand held doppler) ASSESSMENTS - Ostomy and/or Continence Assessment and Care []  - Incontinence Assessment and Management 0 []  - 0 Ostomy Care Assessment and Management (repouching, etc.) PROCESS - Coordination of Care X - Simple Patient / Family Education for ongoing care 1 15 []  - 0 Complex (extensive) Patient / Family Education for ongoing care X- 1 10 Staff obtains Programmer, systems, Records, Test Results / Process Orders []  - 0 Staff telephones HHA, Nursing Homes / Clarify orders / etc []  - 0 Routine Transfer to another Facility (non-emergent condition) []  - 0 Routine Hospital Admission (non-emergent condition) []  - 0 New Admissions / Biomedical engineer / Ordering NPWT, Apligraf, etc. []  - 0 Emergency Hospital Admission (emergent condition) X- 1 10 Simple Discharge Coordination [name], [name]. ([id number]) []  - 0 Complex (extensive) Discharge Coordination PROCESS - Special Needs []  - Pediatric / Minor Patient Management 0 []  - 0 Isolation Patient Management []  - 0 Hearing / Language / Visual special needs []  - 0 Assessment of Community assistance (transportation, D/C planning, etc.) []  - 0 Additional assistance / Altered mentation []  - 0 Support Surface(s) Assessment  (bed, cushion, seat, etc.) INTERVENTIONS - Wound Cleansing / Measurement X - Simple Wound Cleansing - one wound 1 5 []  - 0 Complex Wound Cleansing - multiple wounds X- 1 5 Wound Imaging (photographs - any number of wounds) []  -  0 Wound Tracing (instead of photographs) X- 1 5 Simple Wound Measurement - one wound []  - 0 Complex Wound Measurement - multiple wounds INTERVENTIONS - Wound Dressings []  - Small Wound Dressing one or multiple wounds 0 X- 1 15 Medium Wound Dressing one or multiple wounds []  - 0 Large Wound Dressing one or multiple wounds []  - 0 Application of Medications - topical []  - 0 Application of Medications - injection INTERVENTIONS - Miscellaneous []  - External ear exam 0 []  - 0 Specimen Collection (cultures, biopsies, blood, body fluids, etc.) []  - 0 Specimen(s) / Culture(s) sent or taken to Lab for analysis []  - 0 Patient Transfer (multiple staff / Civil Service fast streamer / Similar devices) []  - 0 Simple Staple / Suture removal (25 or less) []  - 0 Complex Staple / Suture removal (26 or more) []  - 0 Hypo / Hyperglycemic Management (close monitor of Blood Glucose) []  - 0 Ankle / Brachial Index (ABI) - do not check if billed separately X- 1 5 Vital Signs [name], [name] ([id number]) Has the patient been seen at the hospital within the last three years: Yes Total Score: 90 Level Of Care: New/Established - Level 3 Electronic Signature(s) Signed: 05/12/2019 1:28:54 PM By: Army Melia Entered By: Army Melia on 05/12/2019 10:54:00 [name] ([id number]) -------------------------------------------------------------------------------- Encounter Discharge Information Details Patient Name: [name] Date of Service: 05/12/2019 10:30 AM Medical Record Number: [id number] Patient Account Number: [id number] Date of Birth/Sex: 1950-11-11 (68 y.o. M) Treating RN: Army Melia Primary Care Azarias Chiou: [name] Other Clinician: Referring  Konica Stankowski: [name] Treating Camie Hauss/Extender: [name], HOYT Weeks in Treatment: 58 Encounter Discharge Information Items Discharge Condition: Stable Ambulatory Status: Wheelchair Discharge Destination: Home Transportation: Other Accompanied By: wife Schedule Follow-up Appointment: Yes Clinical Summary of Care: Electronic Signature(s) Signed: 05/12/2019 1:28:54 PM By: Army Melia Entered By: Army Melia on 05/12/2019 10:58:16 [name] ([id number]) -------------------------------------------------------------------------------- Lower Extremity Assessment Details Patient Name: [name] Date of Service: 05/12/2019 10:30 AM Medical Record Number: [id number] Patient Account Number: [id number] Date of Birth/Sex: [birth year] (68 y.o. M) Treating RN: Harold Barban Primary Care Congetta Odriscoll: [name] Other Clinician: Referring Paisleigh Maroney: [name] Treating Jaiyana Canale/Extender: [name], HOYT Weeks in Treatment: 75 Notes Wound gluteus Electronic Signature(s) Signed: 05/12/2019 4:05:35 PM By: Harold Barban Entered By: Harold Barban on 05/12/2019 10:34:03 VESTAL, NUTILE ([id number]) -------------------------------------------------------------------------------- Multi Wound Chart Details Patient Name: [name] Date of Service: 05/12/2019 10:30 AM Medical Record Number: [id number] Patient Account Number: [id number] Date of Birth/Sex: 12-28-1950 (68 y.o. M) Treating RN: Army Melia Primary Care Aiyah Scarpelli: [name] Other Clinician: Referring Vyctoria Dickman: [name] Treating Brittaney Beaulieu/Extender: [name], HOYT Weeks in Treatment: 75 Vital Signs Height(in): 73 Pulse(bpm): 66 Weight(lbs): 210 Blood Pressure(mmHg): 130/70 Body Mass Index(BMI): 28 Temperature(F): 97.9 Respiratory Rate 16 (breaths/min): Photos: [N/A:N/A] Wound Location: Right Gluteal fold N/A N/A Wounding Event: Pressure Injury N/A  N/A Primary Etiology: Pressure Ulcer N/A N/A Comorbid History: Cataracts, Hypertension, N/A N/A Paraplegia Date Acquired: 11/02/2017 N/A N/A Weeks of Treatment: 75 N/A N/A Wound Status: Open N/A N/A Measurements L x W x D 4x2x2.5 N/A N/A (cm) Area (cm) : 6.283 N/A N/A Volume (cm) : 15.708 N/A N/A % Reduction in Area: 37.50% N/A N/A % Reduction in Volume: -1463.00% N/A N/A Position 1 (o'clock): 12 Maximum Distance 1 (cm): 4.4 Tunneling: Yes N/A N/A Classification: Category/Stage IV N/A N/A Exudate Amount: Large N/A N/A Exudate Type: Serous N/A N/A Exudate Color: amber N/A N/A Wound Margin: Epibole N/A N/A Granulation Amount: Medium (  34-66%) N/A N/A Granulation Quality: Red, Pink, Hyper-granulation N/A N/A Necrotic Amount: Medium (34-66%) N/A N/A Exposed Structures: Fat Layer (Subcutaneous N/A N/A Tissue) Exposed: Yes Muscle: Yes Fascia: No Fuhr, Corderro J. ([id number]) Tendon: No Joint: No Bone: No Epithelialization: Small (1-33%) N/A N/A Treatment Notes Electronic Signature(s) Signed: 05/12/2019 1:28:54 PM By: Army Melia Entered By: Army Melia on 05/12/2019 10:48:15 KONER, FRERKING ([id number]) -------------------------------------------------------------------------------- [place] Details Patient Name: [name] Date of Service: 05/12/2019 10:30 AM Medical Record Number: [id number] Patient Account Number: [id number] Date of Birth/Sex: Dec 30, 1950 (68 y.o. M) Treating RN: Army Melia Primary Care Christyne Mccain: [name] Other Clinician: Referring Navina Wohlers: [name] Treating Findley Blankenbaker/Extender: [name], HOYT Weeks in Treatment: 78 Active Inactive Orientation to the Wound Care Program Nursing Diagnoses: Knowledge deficit related to the wound healing center program Goals: Patient/caregiver will verbalize understanding of the [place] Program Date Initiated: 11/30/2017 Target Resolution Date:  12/21/2017 Goal Status: Active Interventions: Provide education on orientation to the wound center Notes: Pressure Nursing Diagnoses: Knowledge deficit related to management of pressures ulcers Goals: Patient/caregiver will verbalize understanding of pressure ulcer management Date Initiated: 05/17/2018 Target Resolution Date: 05/28/2018 Goal Status: Active Interventions: Assess: immobility, friction, shearing, incontinence upon admission and as needed Notes: Wound/Skin Impairment Nursing Diagnoses: Impaired tissue integrity Goals: Patient/caregiver will verbalize understanding of skin care regimen Date Initiated: 11/30/2017 Target Resolution Date: 12/21/2017 Goal Status: Active Ulcer/skin breakdown will have a volume reduction of 30% by week 4 Date Initiated: 11/30/2017 Target Resolution Date: 12/21/2017 Goal Status: Active CLAUS, DUREE ([id number]) Interventions: Assess patient/caregiver ability to obtain necessary supplies Assess patient/caregiver ability to perform ulcer/skin care regimen upon admission and as needed Assess ulceration(s) every visit Treatment Activities: Skin care regimen initiated : 11/30/2017 Notes: Electronic Signature(s) Signed: 05/12/2019 1:28:54 PM By: Army Melia Entered By: Army Melia on 05/12/2019 10:47:51 AHMAD, MAGANN ([id number]) -------------------------------------------------------------------------------- Pain Assessment Details Patient Name: [name] Date of Service: 05/12/2019 10:30 AM Medical Record Number: [id number] Patient Account Number: [id number] Date of Birth/Sex: Jun 29, 1951 (68 y.o. M) Treating RN: Harold Barban Primary Care Maan Zarcone: [name] Other Clinician: Referring Julitza Rickles: [name] Treating Saturnino Liew/Extender: [name], HOYT Weeks in Treatment: 67 Active Problems Location of Pain Severity and Description of Pain Patient Has Paino No Site Locations Pain Management and  Medication Current Pain Management: Electronic Signature(s) Signed: 05/12/2019 4:05:35 PM By: Harold Barban Entered By: Harold Barban on 05/12/2019 10:33:45 [name] ([id number]) -------------------------------------------------------------------------------- Patient/Caregiver Education Details Patient Name: [name] Date of Service: 05/12/2019 10:30 AM Medical Record Number: [id number] Patient Account Number: [id number] Date of Birth/Gender: [birth year] (68 y.o. M) Treating RN: Army Melia Primary Care Physician: [name] Other Clinician: Referring Physician: [name] Treating Physician/Extender: Sharalyn Ink in Treatment: 10 Education Assessment Education Provided To: Patient Education Topics Provided Wound/Skin Impairment: Handouts: Caring for Your Ulcer Methods: Demonstration, Explain/Verbal Responses: State content correctly Electronic Signature(s) Signed: 05/12/2019 1:28:54 PM By: Army Melia Entered By: Army Melia on 05/12/2019 10:54:29 ARHUM, CAYABYAB ([id number]) -------------------------------------------------------------------------------- Wound Assessment Details Patient Name: [name] Date of Service: 05/12/2019 10:30 AM Medical Record Number: [id number] Patient Account Number: [id number] Date of Birth/Sex: [birth year] (68 y.o. M) Treating RN: Harold Barban Primary Care Anas Reister: [name] Other Clinician: Referring Mikhail Hallenbeck: [name] Treating Jaice Lague/Extender: [name], HOYT Weeks in Treatment: 75 Wound Status Wound Number: 1 Primary Etiology: Pressure Ulcer Wound Location: Right Gluteal fold Wound Status: Open Wounding Event: Pressure Injury Comorbid History: Cataracts, Hypertension, Paraplegia Date Acquired: 11/02/2017 [place]  Of Treatment: 75 Clustered Wound: No Photos Wound Measurements Length: (cm) 4 Width: (cm) 2 Depth: (cm) 2.5 Area: (cm) 6.283 Volume:  (cm) 15.708 % Reduction in Area: 37.5% % Reduction in Volume: -1463% Epithelialization: Small (1-33%) Tunneling: Yes Position (o'clock): 12 Maximum Distance: (cm) 4.4 Undermining: No Wound Description Classification: Category/Stage IV Wound Margin: Epibole Exudate Amount: Large Exudate Type: Serous Exudate Color: amber Foul Odor After Cleansing: No Slough/Fibrino Yes Wound Bed Granulation Amount: Medium (34-66%) Exposed Structure Granulation Quality: Red, Pink, Hyper-granulation Fascia Exposed: No Necrotic Amount: Medium (34-66%) Fat Layer (Subcutaneous Tissue) Exposed: Yes Necrotic Quality: Adherent Slough Tendon Exposed: No Muscle Exposed: Yes Necrosis of Muscle: No Joint Exposed: No TAOS, LANNEN. ([id number]) Bone Exposed: No Treatment Notes Wound #1 (Right Gluteal fold) Notes zinc to peri wound, Hydrafera Blue, xtrasorb and tegaderm Electronic Signature(s) Signed: 05/12/2019 4:05:35 PM By: Harold Barban Entered By: Harold Barban on 05/12/2019 10:38:04 [name] ([id number]) -------------------------------------------------------------------------------- Vitals Details Patient Name: [name] Date of Service: 05/12/2019 10:30 AM Medical Record Number: [id number] Patient Account Number: [id number] Date of Birth/Sex: [birth year] (68 y.o. M) Treating RN: Army Melia Primary Care Jatziry Wechter: [name] Other Clinician: Referring Seraphine Gudiel: [name] Treating Ebelyn Bohnet/Extender: [name], HOYT Weeks in Treatment: 75 Vital Signs Time Taken: 10:11 Temperature (F): 97.9 Height (in): 73 Pulse (bpm): 66 Weight (lbs): 210 Respiratory Rate (breaths/min): 16 Body Mass Index (BMI): 27.7 Blood Pressure (mmHg): 130/70 Reference Range: 80 - 120 mg / dl Electronic Signature(s) Signed: 05/12/2019 10:54:08 AM By: Lorine Bears RCP, RRT, CHT Entered By: Lorine Bears on 05/12/2019 10:34:02

## 2019-05-12 NOTE — Progress Notes (Addendum)
TYNAN, HESLEY ([id number]) Visit Report for 05/12/2019 Chief Complaint Document Details Patient Name: [name], [name] Date of Service: 05/12/2019 10:30 AM Medical Record Number: [id number] Patient Account Number: [id number] Date of Birth/Sex: Oct 29, 1950 (68 y.o. M) Treating RN: Army Melia Primary Care Provider: [name] Other Clinician: Referring Provider: [name] Treating Provider/Extender: [name], Kaye Mitro Weeks in Treatment: 76 Information Obtained from: Patient Chief Complaint Right gluteal fold ulcer Electronic Signature(s) Signed: 05/12/2019 10:41:35 AM By: Worthy Keeler [name] Entered By: Worthy Keeler on 05/12/2019 10:41:34 CHADI, MIDYETTE ([id number]) -------------------------------------------------------------------------------- HPI Details Patient Name: [name] Date of Service: 05/12/2019 10:30 AM Medical Record Number: [id number] Patient Account Number: [id number] Date of Birth/Sex: [birth year] (68 y.o. 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 He states that there is no history of diabetes that he is aware of. He does have issues with his prostate and is currently receiving treatment for this by way of oral medication. With that being said I do not have a lot of details in that regard. Nonetheless the patient presents today as a result of having been referred to [place] by another provider initially home health was set to come out and take care of his wound although due to the fact  that he apparently drives he's not able to receive home health. His wife is therefore trying to help take care of this wound within although they have been struggling with what exactly to do at this point. She states that she can do some things but she is definitely not a nurse and does have some issues with looking at blood. The good news is the wound does not appear to be too deep and is fairly superficial at this point. There is no slough noted there is some nonviable skin noted around the surface of the wound and the perimeter at this point. The central portion of the wound appears to be very good with a dermal layer noted this does not appear to be again deep enough to extend it to subcutaneous tissue at this point. 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 In the pump he has Dilaudid and baclofen. 12/07/17 on evaluation today patient presents for follow-up concerning his ongoing lower back thigh ulcer on the right. He states that he did not get the supplies ordered and therefore has not really been able to perform the dressing changes as directed exactly. His wife was able to get some Boarder Foam Dressing's from the drugstore and subsequently has been using hydrogel which did help to a degree in the wound does appear to be able smaller. There is actually more drainage this week noted than previous. 12/21/17 on evaluation today patient appears to be doing rather well in regard to his right gluteal ulcer. He has been tolerating the dressing changes without complication. There does not appear to be any evidence of infection at this point in time. Overall the wound does seem to be making some progress as far as the edges  are concerned there's not as much in the way of overlapping of the external wound edges and he has a good epithelium to wound bed border for the most part. This however is not  true right at the 12 o'clock location over the span of a little over a centimeters which actually will require debridement today to clean this away and hopefully allow it to continue to heal more appropriately. 12/28/17 on evaluation today patient appears to be doing rather well in regard to his ulcer in the left gluteal region. He's been tolerating the dressing changes without complication. Apparently he has had some difficulty getting his dressing material. Apparently there's been some confusion with ordering we're gonna check into this. Nonetheless overall he's been showing signs of improvement which is good news. Debridement is not required today. 01/04/18 on evaluation today patient presents for follow-up concerning his right gluteal ulcer. He has been tolerating the dressing changes fairly well. On inspection today it appears he may actually have some maceration them concerned about the fact that he may be developing too much moisture in and around the wound bed which can cause delay in healing. With that being said he unfortunately really has not showed significant signs of improvement since last week's evaluation in fact this may even be just the little bit/slightly larger. Nonetheless he's been having a lot of discomfort I'm not sure this is even related to the wound as he has no pain when I'm to breeding or otherwise cleaning the wound during evaluation today. Nonetheless this is something that we did recommend he talked to his pain specialist concerning. 01/11/18 on evaluation today patient appears to be doing better in regard to his ulceration. He has been tolerating the dressing changes without complication. With that being said overall there's no evidence of infection which is good news. The only thing is he did receive the hatch affair blue classic versus the ready nonetheless I feel like this is perfectly fine and appears to have done well for him over the past week. 01/25/18 on  evaluation today patient's wound actually appears to be a little bit larger than during the last evaluation. The good [name], [name] ([id number]) news is the majority of the wound edges actually appear to be fairly firmly attached to the wound bed unfortunately again we're not really making progress in regard to the size. Roughly the wound is about the same size as when I first saw him although again the wound margin/edges appear to be much better. 02/01/18 on evaluation today patient actually appears to be doing very well in regard to his wound. Applying the Prisma dry does seem to be better although he does still have issues with slow progression of the wound. There was a slight improvement compared to last week's measurements today. Nonetheless I have been considering other options as far as the possibility of Theraskin or even a snap vac. In general I'm not sure that the Theraskin due to location of the wound would be a very good idea. Nonetheless I do think that a snap vac could be a possibility for the patient and in fact I think this could even be an excellent way to manage the wound possibly seeing some improvement in a very rapid fashion here. Nonetheless this is something that we would need to get approved and I did have a lengthy conversation with the patient about this today. 02/08/18 on evaluation today patient appears to be doing a little better in regard to his ulcer.  He has been tolerating the dressing changes without complication. Fortunately despite the fact that the wound is a little bit smaller it's not significantly so unfortunately. We have discussed the possibility of a snap vac we did check with insurance this is actually covered at this point. Fortunately there does not appear to be any sign of infection. Overall I'm fairly pleased with how things seem to be appearing at this point. 02/15/18 on evaluation today patient appears to be doing rather well in regard to his right  gluteal ulcer. Unfortunately the snap vac did not stay in place with his sheer and friction this came loose and did not seem to maintain seal very well. He worked for about two days and it did seem to do very well during that time according to his wife but in general this does not seem to be something that's gonna be beneficial for him long-term. I do believe we need to go back to standard dressings to see if we can find something that will be of benefit. 03/02/18- He is here in follow up evaluation; there is minimal change in the wound. He will continue with the same treatment plan, would consider changing to iodosrob/iodoflex if ulcer continues to to plateau. He will follow up next week 03/08/18 on evaluation today patient's wound actually appears to be about the same size as when I previously saw him several weeks back. Unfortunately he does have some slightly dark discoloration in the central portion of the wound which has me concerned about pressure injury. I do believe he may be sitting for too long a period of time in fact he tells me that "I probably sit for much too long". He does have some Slough noted on the surface of the wound and again as far as the size of the wound is concerned I'm really not seeing anything that seems to have improved significantly. 03/15/18 on evaluation today patient appears to be doing fairly well in regard to his ulcer. The wound measured pretty much about the same today compared to last week's evaluation when looking at his graph. With that being said the area of bruising/deep tissue injury that was noted last week I do not see at this point. He did get a new cushion fortunately this does seem to be have been of benefit in my pinion. It does appear that he's been off of this more which is good news as well I think that is definitely showing in the overall wound measurements. With that being said I do believe that he needs to continue to offload I don't think that  the fact this is doing better should be or is going to allow him to not have to offload and explain this to him as well. Overall he seems to be in agreement the plan I think he understands. The overall appearance of the wound bed is improved compared to last week I think the Iodoflex has been beneficial in that regard. 03/29/18 on evaluation today patient actually appears to be doing rather well in regard to his wound from the overall appearance standpoint he does have some granulation although there's some Slough on the surface of the wound noted as well. With that being said he unfortunately has not improved in regard to the overall measurement of the wound in volume or in size. I did have a discussion with him very specifically about offloading today. He actually does work although he mainly is just sitting throughout the day. He tells me  he offloads by "lifting himself up for 30 seconds off of his chair occasionally" purchase from [employer] which does seem to have helped. And he has a new cushion that he with that being said he's also able to stand some for a very short period of time but not significant enough I think to provide appropriate offloading. I think the biggest issue at this point with the wound and the fact is not healing as quickly as we would like is due to the fact that he is really not able to appropriately offload while at work. He states the beginning after his injury he actually had a bed at his job that he could lay on in order to offload and that does seem to have been of help back at that time. Nonetheless he had not done this in quite some time unfortunately. I think that could be helpful for him this is something I would like for him to look into. 04/05/18 on evaluation today patient actually presents for follow-up concerning his right gluteal ulcer. Again he really is not significantly improved even compared to last week. He has been tolerating the dressing changes  without complication. With that being said fortunately there appears to be no evidence of infection at this time. He has been more proactive in trying to offload. NANA, HOSELTON ([id number]) 04/12/18 on evaluation today patient actually appears to be doing a little better in regard to his wound and the right gluteal fold region. He's been tolerating the dressing changes since removing the oasis without complication. However he was having a lot of burning initially with the oasis in place. He's unsure of exactly why this was given so much discomfort but he assumes that it was the oasis itself causing the problem. Nonetheless this had to be removed after about three days in place although even those three days seem to have made a fairly good improvement in regard to the overall appearance of the wound bed. In fact is the first time that he's made any improvement from the standpoint of measurements in about six weeks. He continues to have no discomfort over the area of the wound itself which leads me to wonder why he was having the burning with the oasis when he does not even feel the actual debridement's themselves. I am somewhat perplexed by this. 04/19/18 on evaluation today patient's wound actually appears to be showing signs of epithelialization around the edge of the wound and in general actually appears to be doing better which is good news. He did have the same burning after about three days with applying the Endoform last week in the same fashion that I would generally apply a skin substitute. This seems to indicate that it's not the oasis to cause the problem but potentially the moisture buildup that just causes things to burn or there may be some other reaction with the skin prep or Steri-Strips. Nonetheless I'm not sure that is gonna be able to tolerate any skin substitute for a long period of time. The good news is the wound actually appears to be doing better today compared to last  week and does seem to finally be making some progress. 04/26/18 on evaluation today patient actually appears to be doing rather well in regard to his ulcer in the right gluteal fold. He has been tolerating the dressing changes without complication which is good news. The Endoform does seem to be helping although he was a little bit more macerated this week. This  seems to be an ongoing issue with fluid control at this point. Nonetheless I think we may be able to add something like Drawtex to help control the drainage. 05/03/18 on evaluation today patient appears to actually be doing better in regard to the overall appearance of his wound. He has been tolerating the dressing changes without complication. Fortunately there appears to be no evidence of infection at this time. I really feel like his wound has shown signs as of today of turning around last week I thought so as well and definitely he could be seen in this week's overall appearance and measurements. In general I'm very pleased with the fact that he finally seems to be making a steady but sure progress. The patient likewise is very pleased. 05/17/18 on evaluation today patient appears to be doing more poorly unfortunately in regard to his ulcer. He has been tolerating the dressing changes without complication. With that being said he tells me that in the past couple of days he and his wife have noticed that we did not seem to be doing quite as well is getting dark near the center. Subsequently upon evaluation today the wound actually does appear to be doing worse compared to previous. He has been tolerating the dressing changes otherwise and he states that he is not been sitting up anymore than he was in the past from what he tells me. Still he has continued to work he states "I'm tired of dealing with this and if I have to just go home and lay in the bed all the time that's what I'll do". Nonetheless I am concerned about the fact that this  wound does appear to be deeper than what it was previous. 05/24/18 upon evaluation today patient actually presents after having been in the hospital due to what was presumed to be sepsis secondary to the wound infection. He had an elevated white blood cell count between 14 and 15. With that being said he does seem to be doing somewhat better now. His wound still is giving him some trouble nonetheless and he is obviously concerned about the fact likely talked about that this does seem to go more deeply than previously noted. I did review his wound culture which showed evidence of Staphylococcus aureus him and group B strep. Nonetheless he is on antibiotics, Levaquin, for this. Subsequently I did review his intake summary from the hospital as well. I also did look at the CT of the lumbar spine with contrast that was performed which showed no bone destruction to suggest lumbar disguises/osteomyelitis or sacral osteomyelitis. There was no paraspinal abscess. Nonetheless it appears this may have been more of just a soft tissue infection at this point which is good news. He still is nonetheless concerned about the wound which again I think is completely reasonable considering everything he's been through recently. 05/31/18 on evaluation today on evaluation today patient actually appears to be showing signs of his wound be a little bit deeper than what I would like to see. Fortunately he does not show any signs of significant infection although his temperature was 99 today he states he's been checking this at home and has not been elevated. Nonetheless with the undermining that I'm seeing at this point I am becoming more concerned about the wound I do think that offloading is a key factor here that is preventing the speedy recovery at this point. There does not appear to be any evidence of again over infection noted. He's been using  Santyl currently. 06/07/18 the patient presents today for follow-up  evaluation regarding the left ulcer in the gluteal region. He has been tolerating the Wound VAC fairly well. He is obviously very frustrated with this he states that to mean is really getting in his way. There does not appear to be any evidence of infection at this time he does have a little bit of odor I do not necessarily associate this with infection just something that we sometimes notice with Wound VAC therapy. With that being said I can definitely catch a tone of discontentment overall in the patient's demeanor today. This when he was previously in the hospital ORVELL, CAREAGA. ([id number]) an CT scan was done of the lumbar region which did not reveal any signs of osteomyelitis. With that being said the pelvis in particular was not evaluated distinctly which means he could still have some osteonecrosis I. Nonetheless the Wound VAC was started on Thursday I do want to get this little bit more time before jumping to a CT scan of the pelvis although that is something that I might would recommend if were not see an improvement by that time. 06/14/18 on evaluation today patient actually appears to be doing about the same in regard to his right gluteal ulcer. Again he did have a CT scan of the lumbar spine unfortunately this did not include the pelvis. Nonetheless with the depth of the wound that I'm seeing today even despite the fact that I'm not seeing any evidence of overt cellulitis I believe there's a good chance that we may be dealing with osteomyelitis somewhere in the right Ischial region. No fevers, chills, nausea, or vomiting noted at this time. 06/21/18 on evaluation today patient actually appears to be doing about the same with regard to his wound. The tunnel at 6 o'clock really does not appear to be any deeper although it is a little bit wider. I think at this point you may want to start packing this with white phone. Unfortunately I have not got approval for the CT scan of the pelvis  as of yet due to the fact that Medicare apparently has been denied it due to the diagnosis codes not being appropriate according to Medicare for the test requested. With that being said the patient cannot have an MRI and therefore this is the only option that we have as far as testing is concerned. The patient has had infection and was on antibiotics and been added code for cellulitis of the bottom to see if this will be appropriate for getting the test approved. Nonetheless I'm concerned about the infection have been spread deeper into the Ischial region. 06/28/18 on evaluation today patient actually appears to be doing rather well all things considered in regard to the right gluteal ulcer. He has been tolerating the dressing changes without complication. With that being said the Wound VAC he states does have to be replaced almost every day or at least reinforced unfortunately. Patient actually has his CT scan later this morning we should have the results by tomorrow. 07/05/18 on evaluation today patient presents for follow-up concerning his right Ischial ulcer. He did see the surgeon Dr. Lysle Pearl last week. They were actually very happy with him and felt like he spent a tremendous amount of time with them as far as discussing his situation was concerned. In the end Dr. Lysle Pearl did contact me as well and determine that he would not recommend any surgical intervention at this point as he felt like  it would not be in the patient's best interest based on what he was seeing. He recommended a referral to infectious disease. Subsequently this is something that Dr. Ines Bloomer office is working on setting up for the patient. As far as evaluation today is concerned the patient's wound actually appears to be worse at this point. I am concerned about how things are progressing and specifically about infection. I do not feel like it's the deeper but the area of depth is definitely widening which does have me concerned.  No fevers, chills, nausea, or vomiting noted at this time. I think that we do need initiate antibiotic therapy the patient has an allow allergy to amoxicillin/penicillin he states that he gets a rash since childhood. Nonetheless she's never had the issues with Catholics or cephalosporins in general but he is aware of. 07/27/18 on evaluation today patient presents following admission to the hospital on 07/09/18. He was subsequently discharged on 07/20/18. On 07/15/18 the patient underwent irrigation and debridement was soft tissue biopsy and bone biopsy as well as placement of a Wound VAC in the OR by Dr. Celine Ahr. During the hospital course the patient was placed on a Wound VAC and recommended follow up with surgery in three weeks actually with Dr. Delaine Lame who is infectious disease. The patient was on vancomycin during the hospital course. 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 He also had a bone culture which revealed evidence of methicillin-resistant staph aureus. He is updated CT scan 07/09/18 reveals that he had progression of the which was performed on wound to breakdown down to the trochanter where he actually had irregularities there as well suggestive of osteomyelitis. This was a change just since 9 December when we last performed a CT scan. Obviously this one had gone downhill quite significantly and rapidly. At this point upon evaluation I feel like in general the patient's wound seems to be doing fairly well all things considered upon my evaluation today. Obviously this is larger and deeper than what I previously evaluated but at the same time he seems to be making some progress as far as the appearance of the granulation tissue is concerned. I'm happy in that regard. No fevers, chills, nausea, or vomiting noted at this time. He is on IV vancomycin and Rocephin at the facility. He is currently in [employer]. 08/03/18 upon evaluation today  patient's wound appears to be doing better in regard to the overall appearance at this point in time. Fortunately he's been tolerating the Wound VAC without complication and states that the facility has been taking excellent care of the wound site. Overall I see some Slough noted on the surface which I am going to attempt sharp debridement today of but nonetheless other than this I feel like he's making progress. 08/09/18 on evaluation today patient's wound appears to be doing much better compared to even last week's evaluation. Do believe that the Wound VAC is been of great benefit for him. He has been tolerating the dressing changes that is the Wound KAYO, ZION. ([id number]) VAC without any complication and he has excellent granulation noted currently. There is no need for sharp debridement at this point. 08/16/18 on evaluation today patient actually appears to be doing very well in regard to the wound in the right gluteal fold region. This is showing signs of progress and again appears to be very healthy which is excellent news. Fortunately there is no sign of active infection by way of odor  or drainage at this point. Overall I'm very pleased with how things stand. He seems to be tolerating the Wound VAC without complication. 08/23/18 on evaluation today patient actually appears to be doing better in regard to his wound. He has been tolerating the Wound VAC without complication and in fact it has been collecting a significant amount of drainage which I think is good news especially considering how the wound appears. Fortunately there is no signs of infection at this time definitely nothing appears to be worse which is good news. He has not been started on the Bactrim and Flagyl that was recommended by Dr. Delaine Lame yet. I did actually contact her office this morning in order to check and see were things are that regard their gonna be calling me back. 08/30/18 on evaluation today patient  actually appears to show signs of excellent improvement today compared to last evaluation. The undermining is getting much better the wound seems to be feeling quite nicely and I'm very pleased that the granulation in general. With that being said overall I feel like the patient has made excellent progress which is great news. No fevers, chills, nausea, or vomiting noted at this time. 09/06/18 on evaluation today patient actually appears to be doing rather well in regard to his right gluteal ulcer. This is showing signs of improvement in overall I'm very pleased with how things seem to be progressing. The patient likewise is please. Overall I see no evidence of infection he is about to complete his oral antibiotic regimen which is the end of the antibiotics for him in just about three days. 09/13/18 on evaluation today patient's right Ischial ulcer appears to be showing signs of continued improvement which is excellent news. He's been tolerating the dressing changes without complication. Fortunately there's no signs of infection and the wound that seems to be doing very well. 09/28/18 on evaluation today patient appears to be doing rather well in regard to his right Ischial ulcer. He's been tolerating the Wound VAC without complication he knows there's much less drainage than there used to be this obviously is not a bad thing in my pinion. There's no evidence of infection despite the fact is but nothing about it now for several weeks. 10/04/18 on evaluation today patient appears to be doing better in regard to his right Ischial wound. He has been tolerating the Wound VAC without complication and I do believe that the silver nitrate last week was beneficial for him. Fortunately overall there's no evidence of active infection at this time which is great news. No fevers, chills, nausea, or vomiting noted at this time. 10/11/18 on evaluation today patient actually appears to be doing rather well in regard  to his Ischial ulcer. He's been tolerating the Wound VAC still without complication I feel like this is doing a good job. No fevers, chills, nausea, or vomiting noted at this time. 11/01/18 on evaluation today patient presents after having not been seen in our clinic for several weeks secondary to the fact that he was on evaluation today patient presents after having not been seen in our clinic for several weeks secondary to the fact that he was in a skilled nursing facility which was on lockdown currently due to the covert 19 national emergency. Subsequently he was discharged from the facility on this past Friday and subsequently made an appointment to come in to see yesterday. Fortunately there's no signs of active infection at this time which is good news and overall he does  seem to have made progress since I last saw. Overall I feel like things are progressing quite nicely. The patient is having no pain. 11/08/18 on evaluation today patient appears to be doing okay in regard to his right gluteal ulcer. He has been utilizing a Wound VAC home health this changing this at this point since he's home from the skilled nursing facility. Fortunately there's no signs of obvious active infection at this time. Unfortunately though there's no obvious active infection he is having some maceration and his wife states that when the sheets of the Wound VAC office on Sunday when it broke seal that he ended up having significant issues with some smell as well there concerned about the possibility of infection. Fortunately there's No fevers, chills, nausea, or vomiting noted at this time. 11/15/18 on evaluation today patient actually appears to be doing well in regard to his right gluteal ulcer. He has been tolerating the dressing changes without complication. Specifically the Wound VAC has been utilized up to this point. Fortunately there's no signs of infection and overall I feel like he has made progress even  since last week when I last saw him. I'm actually fairly happy with the overall appearance although he does seem to have somewhat of a hyper granular Dorion, Rodric J. ([id number]) overgrowth in the central portion of the wound which I think may require some sharp debridement to try flatness out possibly utilizing chemical cauterization following. 11/23/18 on evaluation today patient actually appears to be doing very well in regard to his sacral ulcer. He seems to be showing signs of improvement with good granulation. With that being said he still has the small area of hyper granulation right in the central portion of the wound which I'm gonna likely utilize silver nitrate on today. Subsequently he also keeps having a leak at the 6 o'clock location which is unfortunate we may be able to help out with some suggestions to try to prevent this going forward. Fortunately there's no signs of active infection at this time. 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 He's been tolerating the dressing changes without complication. Fortunately there's no signs of active infection at this time. I've been rather pleased with how things have progressed there still some evidence of pressure getting to the area with some redness right around the immediate wound opening. Nonetheless other than this I'm not seeing any significant complications or issues the wound is somewhat hyper granular. Upon discussing with the patient and his wife today I'm not sure that the wound is being packed to the base with the foam at this point. And if it's not been packed fully that may be part of the reason why is not seen as much improvement as far as the granulation from the base out. Again we do not want pack too tightly but we need some of the firm to get to the base of the wound. I discussed this with patient and his wife  today. 12/06/18 on evaluation today patient appears to be doing well in regard to his right gluteal pressure ulcer. He's been tolerating the dressing changes without complication. Fortunately there's no signs of active infection. He still has some hyper granular tissue and I do think it would be appropriate to continue with the chemical cauterization as of today. 12/16/18 on evaluation today patient actually appears to be doing okay in regard to his right  gluteal ulcer. He is been tolerating the dressing changes without complication including the Wound VAC. Overall I feel like nothing seems to be worsening I do feel like that the hyper granulation buds in the central portion of the wound have improved to some degree with the silver nitrate. We will have to see how things continue to progress. 12/20/18 on evaluation today patient actually appears to be doing much worse in my pinion even compared to last week's evaluation. Unfortunately as opposed to showing any signs of improvement the areas of hyper granular tissue in the central portion of the wound seem to be getting worse. Subsequently the wound bed itself also seems to be getting deeper even compared to last week which is both unusual as well as concerning since prior he had been shown signs of improvement. Nonetheless I think that the issue could be that he's actually having some difficulty in issues with a deeper infection. There's no external signs of infection but nonetheless I am more worried about the internal, osteomyelitis, that could be restarting. He has not been on antibiotics for some time at this point. I think that it may be a good idea to go ahead and started back on an antibiotic therapy while we wait to see what the testing shows. 12/27/18 on evaluation today patient presents for follow-up concerning his left gluteal fold wound. Fortunately he appears to be doing well today. I did review the CT scan which was negative for any signs of  osteomyelitis or acute abnormality this is excellent news. Overall I feel like the surface of the wound bed appears to be doing significantly better today compared to previously noted findings. There does not appear any signs of infection nor does he have any pain at this time. 01/03/19 on evaluation today patient actually appears to be doing quite well in regard to his ulcer. Post debridement last week he really did not have too much bleeding which is good news. Fortunately today this seems to be doing some better but we still has some of the hyper granular tissue noted in the base of the wound which is gonna require sharp debridement today as well. Overall I'm pleased with how things seem to be progressing since we switched away from the Wound VAC I think he is making some progress. 01/10/19 on evaluation today patient appears to be doing better in regard to his right gluteal fold ulcer. He has been tolerating the dressing changes without complication. The debridement to seem to be helping with current away some of the poor hyper granular tissue bugs throughout the region of his gluteal fold wound. He's been tolerating the dressing changes otherwise without complication which is great news. No fevers, chills, nausea, or vomiting noted at this time. 01/17/19 on evaluation today patient actually appears to be doing excellent in regard to his wound. He's been tolerating the dressing changes without complication. Fortunately there is no signs of active infection at this time which is great news. No fevers, chills, nausea, or vomiting noted at this time. 01/24/19 on evaluation today patient actually appears to be doing quite well with regard to his ulcer. He has been tolerating the dressing changes without complication. Fortunately there's no signs of active infection at this time. Overall been very pleased with the progress that he seems to be making currently. 01/31/19-Patient returns at 1 week with  apparent similarity in dimensions to the wound, with no signs of infection, he has been [place] ([id number]) changing dressings twice  a day 02/08/19 upon evaluation today patient actually appears to be doing well with regard to his right Ischial ulcer. The wound is not appear to be quite as deep and seems to be making progress which is good news. With that being said I'm still reluctant to go back to the Wound VAC at this point. He's been having to change the dressings twice a day which is a little bit much in my pinion from the wound care supplies standpoint. I think that possibly attempting to utilize extras orbit may be beneficial this may also help to prevent any additional breakdown secondary to fluid retention in the wound itself. The patient is in agreement with giving this a try. 02/15/19 on evaluation today patient actually appears to be doing decently well with regard to his ulcer in the right to gluteal fold location. He's been tolerating the dressing changes without complication. Fortunately there is no signs of active infection at this time. He is able to keep the current dressing in place more effectively for a day at a time whereas before he was having a changes to to three times a day. The actions or has been helpful in this regard. Fortunately there's no signs of anything getting worse and I do feel like he showing signs of good improvement with regard to the wound bed status. 02/22/2019 patient appears to be doing very well today with regard to his ulcer in the gluteal fold. Fortunately there is no signs of active infection and he has been tolerating the dressing changes without any complication. Overall extremely pleased with how things seem to be progressing. He has much less of the hyper granular projections within the wound these have slowly been debrided away and he seems to be doing well. The wound bed is more uniform. 03/01/19 on evaluation today patient appears to  be doing unfortunately about the same in regard to his gluteal ulcer. He's been tolerating the dressing changes without complication. Fortunately there's no signs of active infection at this time. With that being said he continues to develop these hyper granular projections which I'm unsure of exactly what they are and why they are rising. Nonetheless I explained to the patient that I do believe it would be a good idea for [place] to stand a biopsy sample for pathology to see if that can shed any light on what exactly may be going on here. Fortunately I do not see any obvious signs of infection. With that being said the patient has had a little bit more drainage this week apparently compared to last week. 03/08/2019 on evaluation today patient actually appears to look somewhat better with regard to the appearance of his wound bed at this time. This is good news. Overall I am very pleased with how things seem to have progressed just in the past week with a switch to the [facility] dressing. I think that has been beneficial for him. With that being said at this time the patient is concerned about his biopsy that I sent off last week unfortunately I do not have that report as of yet. Nonetheless we have called to obtain this and hopefully will hear back from the lab later this morning. 03/15/19 on evaluation today patient's wound actually appears to be doing okay today with regard to the overall appearance of the wound bed. He has been tolerating the dressing changes without complication he still has hyper granular tissue noted but fortunately that seems to be minimal at this point compared  to some of what we've seen in the past. Nonetheless I do think that he is still having some issues currently with some of this type of granulation the biopsy and since all showed nothing more than just evidence of granulation tissue. Therefore there really is nothing different to initiate or do at this point. 03/24/19 on  evaluation today patient appears to be doing a little better with regard to his ulcer. He's been tolerating the dressing changes without complication. Fortunately there is no signs of active infection at this time. No fevers, chills, nausea, or vomiting noted at this time. I'm overall pleased with how things seem to be progressing. 03/29/2019 on evaluation today patient appears to be doing about the same in regard to his ulcer in the right gluteal fold. Unfortunately he is not seeming to make a lot of progress and the wound is somewhat stalled. There is no signs of active infection externally but I am concerned about the possibility of infection continuing in the ischial location which previously he did have infection noted. Again is not able to have an MRI so probably our best option for testing for this would be a triple phase bone scan which will detect subtle changes in the bone more so than plain film x-rays. In the past they really have not been beneficial and in fact the CT scans even have been somewhat questionable at times. Nonetheless there is no signs of systemic infection which is at least good news but again his wound is not healing at all on the predicted schedule. 04/05/19 on evaluation today patient appears to be doing well all things considering with regard to his wound and the right gluteal fold. He actually has his triple bone scan scheduled for sometime in the next couple of days. With that being said I've also been looking to other possibilities of what could be causing hyper granular tissue were looking into the bone scan again for evaluating for the risk or possibility of infection deeper and I'm also gonna go ahead and see about obtaining a deep tissue culture today to send and see if there's any evidence of infection noted on culture. He's in agreement with that plan. 04/12/2019 on evaluation today patient presents for reevaluation here in our clinic concerning ongoing issues with  his right gluteal fold ulcer. 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 It was discussed with him at that point that I think it would be appropriate MATEJ, SPILLER. ([id number]) for him to proceed with hyperbaric oxygen therapy especially in light of the fact that he is previously been on IV antibiotics at the beginning of the year for close to 3 months followed by several weeks of oral antibiotics that was all prescribed by infectious disease. He had surgical debridement around Christmas December 2019 due to an abscess and osteomyelitis of the ischial bone. Unfortunately this has not really proceeded as well as we would have liked and again we did a CT scan even a couple months ago as he cannot have an MRI secondary to having issues with both a pain pump as well as a spinal cord stimulator which prevent him from going into an MRI machine. With that being said there were chronic changes noted at the ischial tuberosity which had progressed since December 2019 there was no evidence of fluid collection on that initial CT scan. With that being said  that was on January 21, 2019. I am not sure that it was a sensitive enough test as compared to an MRI and then subsequently I ordered a bone scan for him which was actually completed on 03/29/2019 and this revealed that he does indeed have positive osteomyelitis involving the right ischial tuberosity. This is adjacent to the ulcer and I think is the reason that his ulcer is not healing. Subsequently I am in a place him back on oral antibiotics today unfortunately his wound culture showed just mixed gram-negative flora with no specific findings of a predominant organism. Nonetheless being with the location I think a good broad-spectrum antibiotic for gram-negative's is a good choice at this point he is previously taken Levaquin without  significant issues and I think that is an appropriate antibiotic for him at this time. 04/19/2019 on evaluation today patient actually appears to be doing fairly well with regard to his wound. He has been tolerating the dressing changes without complication. Fortunately there is no signs of active infection and he has been taking the oral antibiotics at this time. Subsequently we did make a referral to infectious disease although Dr. Steva Ready wants the patient to be seen by general surgery first in order apparently to see if there is anything from a surgical standpoint that should be done prior to initiation of IV antibiotic therapy. Again the patient is okay with actually seeing Dr. Celine Ahr whom he has seen before we will make that referral. 04/26/2019 on evaluation today patient actually appears to be doing well with regard to his wound. He has been tolerating the dressing changes without complication. Fortunately there is no signs of active infection at this time. I do believe that the hyperbaric oxygen therapy along with the antibiotics which I prescribed at this point have been doing well for him. With that being said he has not seen Dr. Celine Ahr the surgeon yet in fact they have not been contacted for scheduling an appointment as of yet. Subsequently the patient is not on antibiotics currently by IV Dr. Steva Ready did want him to see Dr. Celine Ahr first which we are working on trying to get scheduled. We again give the information to the patient today for Dr. Celine Ahr and her number as far as contacting their office to see about getting something scheduled. Again were looking for whether or not they recommend any surgical intervention. 05/03/2019 on evaluation today patient appears to be doing about the same with regard to his wound there may be a little bit of filling in in regard to the base of the wound but still he has quite a bit of hyper granular tissue that I am seeing today. I believe that he  may may need a more aggressive sharp debridement possibly even by the surgeon or under anesthesia in order to clear away some of the hyper granular material in order to help allow for appropriate granulation to fill in. We have made a referral to Dr. Celine Ahr unfortunately it sounds as if they did not receive the referral we contacted them today they should be get in touch with the patient. Depending on how things show from that standpoint the patient may need to see Dr. Ardyth Gal who is the infectious disease specialist although he really does not want a PICC line again. No fevers, chills, nausea, vomiting, or diarrhea. He is tolerating hyperbaric oxygen therapy very well. 05/12/2019 on evaluation today patient presents for follow-up visit concerning his right gluteal fold ulcer. He did see Dr. Celine Ahr  the surgeon who previously evaluated his wound and she actually felt like he was doing quite well with regard to the wound based on what she was seen. He does seem to be responding to some degree with the oral antibiotics along with hyperbaric oxygen therapy at this point she did not see any evidence or need for further surgical intervention at this point. She recommended deferring additional or ongoing antibiotic therapy to Dr. Steva Ready at her discretion. Fortunately there is no signs of active systemic infection. No fevers, chills, nausea, vomiting, or diarrhea. Electronic Signature(s) Signed: 05/12/2019 11:02:33 AM By: Worthy Keeler [name] Entered By: Worthy Keeler on 05/12/2019 11:02:32 RASHAWN, HABICHT ([id number]) -------------------------------------------------------------------------------- Physical Exam Details Patient Name: [name] Date of Service: 05/12/2019 10:30 AM Medical Record Number: [id number] Patient Account Number: [id number] Date of Birth/Sex: Dec 29, 1950 (68 y.o. M) Treating RN: Army Melia Primary Care Provider: [name] Other  Clinician: Referring Provider: [name] Treating Provider/Extender: [name], Pearl Berlinger Weeks in Treatment: 3 Constitutional Well-nourished and well-hydrated in no acute distress. Respiratory normal breathing without difficulty. clear to auscultation bilaterally. Cardiovascular regular rate and rhythm with normal S1, S2. Psychiatric this patient is able to make decisions and demonstrates good insight into disease process. Alert and Oriented x 3. pleasant and cooperative. Notes Upon inspection today patient's wound bed again showed signs of good granulation at this time there does not appear to be any evidence of active infection currently which is good news. He is not having any discomfort and again shows no signs of purulent drainage at this time. Electronic Signature(s) Signed: 05/12/2019 11:03:31 AM By: Worthy Keeler [name] Entered By: Worthy Keeler on 05/12/2019 11:03:30 DAGOBERTO, MCCORKEL ([id number]) -------------------------------------------------------------------------------- Physician Orders Details Patient Name: [name] Date of Service: 05/12/2019 10:30 AM Medical Record Number: [id number] Patient Account Number: [id number] Date of Birth/Sex: 02-07-1951 (68 y.o. 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 Skin Barriers/Peri-Wound Care Wound #1 Right Gluteal fold o Other: - Apply zinc oxide to peri-wound area. Primary Wound Dressing Wound #1 Right Gluteal fold o Hydrafera Blue Ready Transfer Secondary Dressing Wound #1 Right Gluteal fold o XtraSorb - Secure with Tegaderm Dressing Change Frequency Wound #1 Right Gluteal fold o Change dressing every day. Follow-up Appointments Wound #1 Right Gluteal fold o Return Appointment in 1 week. Off-Loading Wound #1 Right Gluteal fold o Turn and reposition every 2 hours BALTAZAR, PEROTTI ([id number]) Home Health Wound #1 Right Gluteal fold o [place] for [place] Nurse may visit PRN to address patientos wound care needs. o FACE TO FACE ENCOUNTER: MEDICARE and MEDICAID PATIENTS: I certify that this patient is under my care and that I had a face-to-face encounter that meets the physician face-to-face encounter requirements with this patient on this date. The encounter with the patient was in whole or in part for the following  MEDICAL CONDITION: (primary reason for Home Healthcare) MEDICAL NECESSITY: I certify, that based on my findings, NURSING services are a medically necessary home health service. HOME BOUND STATUS: I certify that my clinical findings support that this patient is homebound (i.e., Due to illness or injury, pt requires aid of supportive devices such as crutches, cane, wheelchairs, walkers, the use of special transportation or the assistance of another person to leave their place of residence. There is a normal inability to leave the home and doing so requires considerable and taxing effort. Other absences are for medical reasons / religious services and are infrequent or of short duration when for other reasons). o If current dressing causes regression in wound condition, may D/C  ordered dressing product/s and apply Normal Saline Moist Dressing daily until next [place] / Other MD appointment. [place] of regression in wound condition at [phone]. o Please direct any NON-WOUND related issues/requests for orders to patient's Primary Care Physician Negative Pressure Wound Therapy Wound #1 Right Gluteal fold o Wound VAC settings at 125/130 mmHg continuous pressure. Use BLACK/GREEN foam to wound cavity. Use WHITE foam to fill any tunnel/s and/or undermining. Change VAC dressing 3 X WEEK. Change canister as indicated when full. Nurse may titrate settings and frequency of dressing changes as clinically indicated. o Home Health Nurse may d/c VAC for s/s of increased infection, significant wound regression, or uncontrolled drainage. [place] at [phone]. 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 o If appropriate for treatment, begin HBOT per protocol: o 2.0 ATA for 90 Minutes without Air Breaks o One treatment per day (delivered Monday through Friday unless otherwise specified in Special Instructions below): o Total # of Treatments: - 40 Electronic Signature(s) Signed: 05/12/2019 1:28:54 PM By: Army Melia Signed: 05/12/2019 6:36:47 PM By: Worthy Keeler [name] Entered By: Army Melia on 05/12/2019 10:53:02 JOHNTAVIUS, BRODIN ([id number]) -------------------------------------------------------------------------------- Problem List Details Patient Name: [name] Date of Service: 05/12/2019 10:30 AM Medical Record Number: [id number] Patient Account Number: [id number] Date of Birth/Sex: 1950-12-08 (68 y.o. M) Treating RN: Army Melia Primary Care Provider: [name] Other Clinician: Referring Provider: [name] Treating Provider/Extender: [name], Evangelynn Lochridge Weeks in Treatment: 17 Active Problems ICD-10 Evaluated Encounter Code Description Active Date Today Diagnosis L89.314 Pressure ulcer of right buttock, stage 4 11/30/2017 No Yes M86.68 Other chronic osteomyelitis, other site 04/08/2019 No Yes L03.317 Cellulitis of buttock 06/21/2018 No Yes G82.20 Paraplegia, unspecified 11/30/2017 No Yes S34.109S Unspecified injury to unspecified level of lumbar spinal cord, 11/30/2017 No Yes sequela I10 Essential (primary) hypertension 11/30/2017 No Yes Inactive Problems Resolved Problems Electronic Signature(s) Signed: 05/12/2019 10:41:26 AM By: Worthy Keeler [name] Entered By: Worthy Keeler on 05/12/2019 10:41:26 [name] ([id number]) -------------------------------------------------------------------------------- Progress Note Details Patient Name: [name] Date of Service: 05/12/2019 10:30 AM Medical Record Number: [id number] Patient Account Number: [id number] Date of Birth/Sex: [birth year] (68 y.o. 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 He states that there is no history of diabetes that he is aware of. He does have issues with his prostate and is currently receiving treatment for this by way of oral medication. With that being said I do not have a lot of details in that regard. Nonetheless the patient presents today as a result of having been  referred to [place] by another provider initially home health was set to come out and take care of his wound although due to the fact that he apparently drives he's not able to receive home health. His wife is therefore trying to help take care of this wound within although they have been struggling with what exactly to do at this point. She states that she can do some things but she is definitely not a nurse and does have some issues with looking at blood. The good news is the wound does not appear to be too deep and is fairly superficial at this point. There is no slough noted there is some nonviable skin noted around the surface of the wound and the perimeter at this point. The central portion of the wound appears to be very good with a dermal layer noted this does not appear to be again deep enough to extend it to subcutaneous tissue at this point. 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 In the pump he has Dilaudid and baclofen. 12/07/17 on evaluation today patient presents for follow-up concerning his ongoing lower back thigh ulcer on the right. He states that he did not get the supplies ordered and therefore has not really been able to perform the dressing changes as directed exactly. His wife was able to get some Boarder Foam Dressing's from the drugstore and subsequently has been using hydrogel which did help to a degree in the wound does appear to be able smaller. There is actually more drainage this week noted than previous. 12/21/17 on evaluation today patient appears to be doing rather well in regard to his right gluteal ulcer. He has been tolerating the dressing changes without complication. There does not appear to be any evidence of infection at this point in time. Overall the wound does seem to be making some progress as far as the edges are concerned there's not as much in the way  of overlapping of the external wound edges and he has a good epithelium to wound bed border for the most part. This however is not true right at the 12 o'clock location over the span of a little over a centimeters which actually will require debridement today to clean this away and hopefully allow it to continue to heal more appropriately. 12/28/17 on evaluation today patient appears to be doing rather well in regard to his ulcer in the left gluteal region. He's been tolerating the dressing changes without complication. Apparently he has had some difficulty getting his dressing material. Apparently there's been some confusion with ordering we're gonna check into this. Nonetheless overall he's been showing signs of improvement which is good news. Debridement is not required today. 01/04/18 on evaluation today patient presents for follow-up concerning his right gluteal ulcer. He has been tolerating the dressing changes fairly well. On inspection today it appears he may actually have some maceration them concerned about the fact that he may be developing too  much moisture in and around the wound bed which can cause delay in healing. With that being said he unfortunately really has not showed significant signs of improvement since last week's evaluation in fact this may even be just the little bit/slightly larger. Nonetheless he's been having a lot of discomfort I'm not sure this is even related to the wound as he has no pain when I'm to breeding or otherwise cleaning the wound during evaluation today. Nonetheless this is something that we did recommend he talked to his pain specialist concerning. 01/11/18 on evaluation today patient appears to be doing better in regard to his ulceration. He has been tolerating the dressing Mix, Brayden J. ([id number]) changes without complication. With that being said overall there's no evidence of infection which is good news. The only thing is he did receive the  hatch affair blue classic versus the ready nonetheless I feel like this is perfectly fine and appears to have done well for him over the past week. 01/25/18 on evaluation today patient's wound actually appears to be a little bit larger than during the last evaluation. The good news is the majority of the wound edges actually appear to be fairly firmly attached to the wound bed unfortunately again we're not really making progress in regard to the size. Roughly the wound is about the same size as when I first saw him although again the wound margin/edges appear to be much better. 02/01/18 on evaluation today patient actually appears to be doing very well in regard to his wound. Applying the Prisma dry does seem to be better although he does still have issues with slow progression of the wound. There was a slight improvement compared to last week's measurements today. Nonetheless I have been considering other options as far as the possibility of Theraskin or even a snap vac. In general I'm not sure that the Theraskin due to location of the wound would be a very good idea. Nonetheless I do think that a snap vac could be a possibility for the patient and in fact I think this could even be an excellent way to manage the wound possibly seeing some improvement in a very rapid fashion here. Nonetheless this is something that we would need to get approved and I did have a lengthy conversation with the patient about this today. 02/08/18 on evaluation today patient appears to be doing a little better in regard to his ulcer. He has been tolerating the dressing changes without complication. Fortunately despite the fact that the wound is a little bit smaller it's not significantly so unfortunately. We have discussed the possibility of a snap vac we did check with insurance this is actually covered at this point. Fortunately there does not appear to be any sign of infection. Overall I'm fairly pleased with how things  seem to be appearing at this point. 02/15/18 on evaluation today patient appears to be doing rather well in regard to his right gluteal ulcer. Unfortunately the snap vac did not stay in place with his sheer and friction this came loose and did not seem to maintain seal very well. He worked for about two days and it did seem to do very well during that time according to his wife but in general this does not seem to be something that's gonna be beneficial for him long-term. I do believe we need to go back to standard dressings to see if we can find something that will be of benefit. 03/02/18- He is here in  follow up evaluation; there is minimal change in the wound. He will continue with the same treatment plan, would consider changing to iodosrob/iodoflex if ulcer continues to to plateau. He will follow up next week 03/08/18 on evaluation today patient's wound actually appears to be about the same size as when I previously saw him several weeks back. Unfortunately he does have some slightly dark discoloration in the central portion of the wound which has me concerned about pressure injury. I do believe he may be sitting for too long a period of time in fact he tells me that "I probably sit for much too long". He does have some Slough noted on the surface of the wound and again as far as the size of the wound is concerned I'm really not seeing anything that seems to have improved significantly. 03/15/18 on evaluation today patient appears to be doing fairly well in regard to his ulcer. The wound measured pretty much about the same today compared to last week's evaluation when looking at his graph. With that being said the area of bruising/deep tissue injury that was noted last week I do not see at this point. He did get a new cushion fortunately this does seem to be have been of benefit in my pinion. It does appear that he's been off of this more which is good news as well I think that is definitely  showing in the overall wound measurements. With that being said I do believe that he needs to continue to offload I don't think that the fact this is doing better should be or is going to allow him to not have to offload and explain this to him as well. Overall he seems to be in agreement the plan I think he understands. The overall appearance of the wound bed is improved compared to last week I think the Iodoflex has been beneficial in that regard. 03/29/18 on evaluation today patient actually appears to be doing rather well in regard to his wound from the overall appearance standpoint he does have some granulation although there's some Slough on the surface of the wound noted as well. With that being said he unfortunately has not improved in regard to the overall measurement of the wound in volume or in size. I did have a discussion with him very specifically about offloading today. He actually does work although he mainly is just sitting throughout the day. He tells me he offloads by "lifting himself up for 30 seconds off of his chair occasionally" purchase from [employer] which does seem to have helped. And he has a new cushion that he with that being said he's also able to stand some for a very short period of time but not significant enough I think to provide appropriate offloading. I think the biggest issue at this point with the wound and the fact is not healing as quickly as we would like is due to the fact that he is really not able to appropriately offload while at work. He states the beginning after his injury he actually had a bed at his job that he could lay on in order to offload and that does seem to have been of help back at that time. Nonetheless he had not done this in quite some time unfortunately. I think that could be helpful for him this is something I would like for him to look into. ROZAY, GILLELAND ([id number]) 04/05/18 on evaluation today patient actually presents  for follow-up concerning his right  gluteal ulcer. Again he really is not significantly improved even compared to last week. He has been tolerating the dressing changes without complication. With that being said fortunately there appears to be no evidence of infection at this time. He has been more proactive in trying to offload. 04/12/18 on evaluation today patient actually appears to be doing a little better in regard to his wound and the right gluteal fold region. He's been tolerating the dressing changes since removing the oasis without complication. However he was having a lot of burning initially with the oasis in place. He's unsure of exactly why this was given so much discomfort but he assumes that it was the oasis itself causing the problem. Nonetheless this had to be removed after about three days in place although even those three days seem to have made a fairly good improvement in regard to the overall appearance of the wound bed. In fact is the first time that he's made any improvement from the standpoint of measurements in about six weeks. He continues to have no discomfort over the area of the wound itself which leads me to wonder why he was having the burning with the oasis when he does not even feel the actual debridement's themselves. I am somewhat perplexed by this. 04/19/18 on evaluation today patient's wound actually appears to be showing signs of epithelialization around the edge of the wound and in general actually appears to be doing better which is good news. He did have the same burning after about three days with applying the Endoform last week in the same fashion that I would generally apply a skin substitute. This seems to indicate that it's not the oasis to cause the problem but potentially the moisture buildup that just causes things to burn or there may be some other reaction with the skin prep or Steri-Strips. Nonetheless I'm not sure that is gonna be able to tolerate  any skin substitute for a long period of time. The good news is the wound actually appears to be doing better today compared to last week and does seem to finally be making some progress. 04/26/18 on evaluation today patient actually appears to be doing rather well in regard to his ulcer in the right gluteal fold. He has been tolerating the dressing changes without complication which is good news. The Endoform does seem to be helping although he was a little bit more macerated this week. This seems to be an ongoing issue with fluid control at this point. Nonetheless I think we may be able to add something like Drawtex to help control the drainage. 05/03/18 on evaluation today patient appears to actually be doing better in regard to the overall appearance of his wound. He has been tolerating the dressing changes without complication. Fortunately there appears to be no evidence of infection at this time. I really feel like his wound has shown signs as of today of turning around last week I thought so as well and definitely he could be seen in this week's overall appearance and measurements. In general I'm very pleased with the fact that he finally seems to be making a steady but sure progress. The patient likewise is very pleased. 05/17/18 on evaluation today patient appears to be doing more poorly unfortunately in regard to his ulcer. He has been tolerating the dressing changes without complication. With that being said he tells me that in the past couple of days he and his wife have noticed that we did not seem to  be doing quite as well is getting dark near the center. Subsequently upon evaluation today the wound actually does appear to be doing worse compared to previous. He has been tolerating the dressing changes otherwise and he states that he is not been sitting up anymore than he was in the past from what he tells me. Still he has continued to work he states "I'm tired of dealing with this and  if I have to just go home and lay in the bed all the time that's what I'll do". Nonetheless I am concerned about the fact that this wound does appear to be deeper than what it was previous. 05/24/18 upon evaluation today patient actually presents after having been in the hospital due to what was presumed to be sepsis secondary to the wound infection. He had an elevated white blood cell count between 14 and 15. With that being said he does seem to be doing somewhat better now. His wound still is giving him some trouble nonetheless and he is obviously concerned about the fact likely talked about that this does seem to go more deeply than previously noted. I did review his wound culture which showed evidence of Staphylococcus aureus him and group B strep. Nonetheless he is on antibiotics, Levaquin, for this. Subsequently I did review his intake summary from the hospital as well. I also did look at the CT of the lumbar spine with contrast that was performed which showed no bone destruction to suggest lumbar disguises/osteomyelitis or sacral osteomyelitis. There was no paraspinal abscess. Nonetheless it appears this may have been more of just a soft tissue infection at this point which is good news. He still is nonetheless concerned about the wound which again I think is completely reasonable considering everything he's been through recently. 05/31/18 on evaluation today on evaluation today patient actually appears to be showing signs of his wound be a little bit deeper than what I would like to see. Fortunately he does not show any signs of significant infection although his temperature was 99 today he states he's been checking this at home and has not been elevated. Nonetheless with the undermining that I'm seeing at this point I am becoming more concerned about the wound I do think that offloading is a key factor here that is preventing the speedy recovery at this point. There does not appear to be any  evidence of again over infection noted. He's been using Santyl currently. ANDREL, BONNET ([id number]) 06/07/18 the patient presents today for follow-up evaluation regarding the left ulcer in the gluteal region. He has been tolerating the Wound VAC fairly well. He is obviously very frustrated with this he states that to mean is really getting in his way. There does not appear to be any evidence of infection at this time he does have a little bit of odor I do not necessarily associate this with infection just something that we sometimes notice with Wound VAC therapy. With that being said I can definitely catch a tone of discontentment overall in the patient's demeanor today. This when he was previously in the hospital an CT scan was done of the lumbar region which did not reveal any signs of osteomyelitis. With that being said the pelvis in particular was not evaluated distinctly which means he could still have some osteonecrosis I. Nonetheless the Wound VAC was started on Thursday I do want to get this little bit more time before jumping to a CT scan of the pelvis although that is  something that I might would recommend if were not see an improvement by that time. 06/14/18 on evaluation today patient actually appears to be doing about the same in regard to his right gluteal ulcer. Again he did have a CT scan of the lumbar spine unfortunately this did not include the pelvis. Nonetheless with the depth of the wound that I'm seeing today even despite the fact that I'm not seeing any evidence of overt cellulitis I believe there's a good chance that we may be dealing with osteomyelitis somewhere in the right Ischial region. No fevers, chills, nausea, or vomiting noted at this time. 06/21/18 on evaluation today patient actually appears to be doing about the same with regard to his wound. The tunnel at 6 o'clock really does not appear to be any deeper although it is a little bit wider. I think at this  point you may want to start packing this with white phone. Unfortunately I have not got approval for the CT scan of the pelvis as of yet due to the fact that Medicare apparently has been denied it due to the diagnosis codes not being appropriate according to Medicare for the test requested. With that being said the patient cannot have an MRI and therefore this is the only option that we have as far as testing is concerned. The patient has had infection and was on antibiotics and been added code for cellulitis of the bottom to see if this will be appropriate for getting the test approved. Nonetheless I'm concerned about the infection have been spread deeper into the Ischial region. 06/28/18 on evaluation today patient actually appears to be doing rather well all things considered in regard to the right gluteal ulcer. He has been tolerating the dressing changes without complication. With that being said the Wound VAC he states does have to be replaced almost every day or at least reinforced unfortunately. Patient actually has his CT scan later this morning we should have the results by tomorrow. 07/05/18 on evaluation today patient presents for follow-up concerning his right Ischial ulcer. He did see the surgeon Dr. Lysle Pearl last week. They were actually very happy with him and felt like he spent a tremendous amount of time with them as far as discussing his situation was concerned. In the end Dr. Lysle Pearl did contact me as well and determine that he would not recommend any surgical intervention at this point as he felt like it would not be in the patient's best interest based on what he was seeing. He recommended a referral to infectious disease. Subsequently this is something that Dr. Ines Bloomer office is working on setting up for the patient. As far as evaluation today is concerned the patient's wound actually appears to be worse at this point. I am concerned about how things are progressing and specifically  about infection. I do not feel like it's the deeper but the area of depth is definitely widening which does have me concerned. No fevers, chills, nausea, or vomiting noted at this time. I think that we do need initiate antibiotic therapy the patient has an allow allergy to amoxicillin/penicillin he states that he gets a rash since childhood. Nonetheless she's never had the issues with Catholics or cephalosporins in general but he is aware of. 07/27/18 on evaluation today patient presents following admission to the hospital on 07/09/18. He was subsequently discharged on 07/20/18. On 07/15/18 the patient underwent irrigation and debridement was soft tissue biopsy and bone biopsy as well as placement of a  Wound VAC in the OR by Dr. Celine Ahr. During the hospital course the patient was placed on a Wound VAC and recommended follow up with surgery in three weeks actually with Dr. Delaine Lame who is infectious disease. The patient was on vancomycin during the hospital course. 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 He also had a bone culture which revealed evidence of methicillin-resistant staph aureus. He is updated CT scan 07/09/18 reveals that he had progression of the which was performed on wound to breakdown down to the trochanter where he actually had irregularities there as well suggestive of osteomyelitis. This was a change just since 9 December when we last performed a CT scan. Obviously this one had gone downhill quite significantly and rapidly. At this point upon evaluation I feel like in general the patient's wound seems to be doing fairly well all things considered upon my evaluation today. Obviously this is larger and deeper than what I previously evaluated but at the same time he seems to be making some progress as far as the appearance of the granulation tissue is concerned. I'm happy in that regard. No fevers, chills, nausea, or vomiting noted at this time. He  is on IV vancomycin and Rocephin at the facility. He is currently in [facility]. 08/03/18 upon evaluation today patient's wound appears to be doing better in regard to the overall appearance at this point in time. Fortunately he's been tolerating the Wound VAC without complication and states that the facility has been taking STATLER, BROAS. ([id number]) excellent care of the wound site. Overall I see some Slough noted on the surface which I am going to attempt sharp debridement today of but nonetheless other than this I feel like he's making progress. 08/09/18 on evaluation today patient's wound appears to be doing much better compared to even last week's evaluation. Do believe that the Wound VAC is been of great benefit for him. He has been tolerating the dressing changes that is the Wound VAC without any complication and he has excellent granulation noted currently. There is no need for sharp debridement at this point. 08/16/18 on evaluation today patient actually appears to be doing very well in regard to the wound in the right gluteal fold region. This is showing signs of progress and again appears to be very healthy which is excellent news. Fortunately there is no sign of active infection by way of odor or drainage at this point. Overall I'm very pleased with how things stand. He seems to be tolerating the Wound VAC without complication. 08/23/18 on evaluation today patient actually appears to be doing better in regard to his wound. He has been tolerating the Wound VAC without complication and in fact it has been collecting a significant amount of drainage which I think is good news especially considering how the wound appears. Fortunately there is no signs of infection at this time definitely nothing appears to be worse which is good news. He has not been started on the Bactrim and Flagyl that was recommended by Dr. Delaine Lame yet. I did actually contact her office this morning in  order to check and see were things are that regard their gonna be calling me back. 08/30/18 on evaluation today patient actually appears to show signs of excellent improvement today compared to last evaluation. The undermining is getting much better the wound seems to be feeling quite nicely and I'm very pleased that the granulation in general. With that being said overall I feel  like the patient has made excellent progress which is great news. No fevers, chills, nausea, or vomiting noted at this time. 09/06/18 on evaluation today patient actually appears to be doing rather well in regard to his right gluteal ulcer. This is showing signs of improvement in overall I'm very pleased with how things seem to be progressing. The patient likewise is please. Overall I see no evidence of infection he is about to complete his oral antibiotic regimen which is the end of the antibiotics for him in just about three days. 09/13/18 on evaluation today patient's right Ischial ulcer appears to be showing signs of continued improvement which is excellent news. He's been tolerating the dressing changes without complication. Fortunately there's no signs of infection and the wound that seems to be doing very well. 09/28/18 on evaluation today patient appears to be doing rather well in regard to his right Ischial ulcer. He's been tolerating the Wound VAC without complication he knows there's much less drainage than there used to be this obviously is not a bad thing in my pinion. There's no evidence of infection despite the fact is but nothing about it now for several weeks. 10/04/18 on evaluation today patient appears to be doing better in regard to his right Ischial wound. He has been tolerating the Wound VAC without complication and I do believe that the silver nitrate last week was beneficial for him. Fortunately overall there's no evidence of active infection at this time which is great news. No fevers, chills, nausea,  or vomiting noted at this time. 10/11/18 on evaluation today patient actually appears to be doing rather well in regard to his Ischial ulcer. He's been tolerating the Wound VAC still without complication I feel like this is doing a good job. No fevers, chills, nausea, or vomiting noted at this time. 11/01/18 on evaluation today patient presents after having not been seen in our clinic for several weeks secondary to the fact that he was on evaluation today patient presents after having not been seen in our clinic for several weeks secondary to the fact that he was in a skilled nursing facility which was on lockdown currently due to the covert 19 national emergency. Subsequently he was discharged from the facility on this past Friday and subsequently made an appointment to come in to see yesterday. Fortunately there's no signs of active infection at this time which is good news and overall he does seem to have made progress since I last saw. Overall I feel like things are progressing quite nicely. The patient is having no pain. 11/08/18 on evaluation today patient appears to be doing okay in regard to his right gluteal ulcer. He has been utilizing a Wound VAC home health this changing this at this point since he's home from the skilled nursing facility. Fortunately there's no signs of obvious active infection at this time. Unfortunately though there's no obvious active infection he is having some maceration and his wife states that when the sheets of the Wound VAC office on Sunday when it broke seal that he ended up having significant issues with some smell as well there concerned about the possibility of infection. Fortunately there's No fevers, chills, nausea, or vomiting noted at this time. GABRAEL, MARES ([id number]) 11/15/18 on evaluation today patient actually appears to be doing well in regard to his right gluteal ulcer. He has been tolerating the dressing changes without complication.  Specifically the Wound VAC has been utilized up to this point. Fortunately there's no  signs of infection and overall I feel like he has made progress even since last week when I last saw him. I'm actually fairly happy with the overall appearance although he does seem to have somewhat of a hyper granular overgrowth in the central portion of the wound which I think may require some sharp debridement to try flatness out possibly utilizing chemical cauterization following. 11/23/18 on evaluation today patient actually appears to be doing very well in regard to his sacral ulcer. He seems to be showing signs of improvement with good granulation. With that being said he still has the small area of hyper granulation right in the central portion of the wound which I'm gonna likely utilize silver nitrate on today. Subsequently he also keeps having a leak at the 6 o'clock location which is unfortunate we may be able to help out with some suggestions to try to prevent this going forward. Fortunately there's no signs of active infection at this time. 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 He's been tolerating the dressing changes without complication. Fortunately there's no signs of active infection at this time. I've been rather pleased with how things have progressed there still some evidence of pressure getting to the area with some redness right around the immediate wound opening. Nonetheless other than this I'm not seeing any significant complications or issues the wound is somewhat hyper granular. Upon discussing with the patient and his wife today I'm not sure that the wound is being packed to the base with the foam at this point. And if it's not been packed fully that may be part of the reason why is not seen as much improvement as far as the granulation from the base out. Again we do not want pack too  tightly but we need some of the firm to get to the base of the wound. I discussed this with patient and his wife today. 12/06/18 on evaluation today patient appears to be doing well in regard to his right gluteal pressure ulcer. He's been tolerating the dressing changes without complication. Fortunately there's no signs of active infection. He still has some hyper granular tissue and I do think it would be appropriate to continue with the chemical cauterization as of today. 12/16/18 on evaluation today patient actually appears to be doing okay in regard to his right gluteal ulcer. He is been tolerating the dressing changes without complication including the Wound VAC. Overall I feel like nothing seems to be worsening I do feel like that the hyper granulation buds in the central portion of the wound have improved to some degree with the silver nitrate. We will have to see how things continue to progress. 12/20/18 on evaluation today patient actually appears to be doing much worse in my pinion even compared to last week's evaluation. Unfortunately as opposed to showing any signs of improvement the areas of hyper granular tissue in the central portion of the wound seem to be getting worse. Subsequently the wound bed itself also seems to be getting deeper even compared to last week which is both unusual as well as concerning since prior he had been shown signs of improvement. Nonetheless I think that the issue could be that he's actually having some difficulty in issues with a deeper infection. There's no external signs of infection but nonetheless I am more worried about the internal, osteomyelitis, that could be restarting. He has not  been on antibiotics for some time at this point. I think that it may be a good idea to go ahead and started back on an antibiotic therapy while we wait to see what the testing shows. 12/27/18 on evaluation today patient presents for follow-up concerning his left gluteal fold  wound. Fortunately he appears to be doing well today. I did review the CT scan which was negative for any signs of osteomyelitis or acute abnormality this is excellent news. Overall I feel like the surface of the wound bed appears to be doing significantly better today compared to previously noted findings. There does not appear any signs of infection nor does he have any pain at this time. 01/03/19 on evaluation today patient actually appears to be doing quite well in regard to his ulcer. Post debridement last week he really did not have too much bleeding which is good news. Fortunately today this seems to be doing some better but we still has some of the hyper granular tissue noted in the base of the wound which is gonna require sharp debridement today as well. Overall I'm pleased with how things seem to be progressing since we switched away from the Wound VAC I think he is making some progress. 01/10/19 on evaluation today patient appears to be doing better in regard to his right gluteal fold ulcer. He has been tolerating the dressing changes without complication. The debridement to seem to be helping with current away some of the poor hyper granular tissue bugs throughout the region of his gluteal fold wound. He's been tolerating the dressing changes otherwise without complication which is great news. No fevers, chills, nausea, or vomiting noted at this time. 01/17/19 on evaluation today patient actually appears to be doing excellent in regard to his wound. He's been tolerating the dressing changes without complication. Fortunately there is no signs of active infection at this time which is great news. No fevers, chills, nausea, or vomiting noted at this time. ELISEE, HOOPES ([id number]) 01/24/19 on evaluation today patient actually appears to be doing quite well with regard to his ulcer. He has been tolerating the dressing changes without complication. Fortunately there's no signs of active  infection at this time. Overall been very pleased with the progress that he seems to be making currently. 01/31/19-Patient returns at 1 week with apparent similarity in dimensions to the wound, with no signs of infection, he has been changing dressings twice a day 02/08/19 upon evaluation today patient actually appears to be doing well with regard to his right Ischial ulcer. The wound is not appear to be quite as deep and seems to be making progress which is good news. With that being said I'm still reluctant to go back to the Wound VAC at this point. He's been having to change the dressings twice a day which is a little bit much in my pinion from the wound care supplies standpoint. I think that possibly attempting to utilize extras orbit may be beneficial this may also help to prevent any additional breakdown secondary to fluid retention in the wound itself. The patient is in agreement with giving this a try. 02/15/19 on evaluation today patient actually appears to be doing decently well with regard to his ulcer in the right to gluteal fold location. He's been tolerating the dressing changes without complication. Fortunately there is no signs of active infection at this time. He is able to keep the current dressing in place more effectively for a day at a time whereas  before he was having a changes to to three times a day. The actions or has been helpful in this regard. Fortunately there's no signs of anything getting worse and I do feel like he showing signs of good improvement with regard to the wound bed status. 02/22/2019 patient appears to be doing very well today with regard to his ulcer in the gluteal fold. Fortunately there is no signs of active infection and he has been tolerating the dressing changes without any complication. Overall extremely pleased with how things seem to be progressing. He has much less of the hyper granular projections within the wound these have slowly been debrided  away and he seems to be doing well. The wound bed is more uniform. 03/01/19 on evaluation today patient appears to be doing unfortunately about the same in regard to his gluteal ulcer. He's been tolerating the dressing changes without complication. Fortunately there's no signs of active infection at this time. With that being said he continues to develop these hyper granular projections which I'm unsure of exactly what they are and why they are rising. Nonetheless I explained to the patient that I do believe it would be a good idea for [place] to stand a biopsy sample for pathology to see if that can shed any light on what exactly may be going on here. Fortunately I do not see any obvious signs of infection. With that being said the patient has had a little bit more drainage this week apparently compared to last week. 03/08/2019 on evaluation today patient actually appears to look somewhat better with regard to the appearance of his wound bed at this time. This is good news. Overall I am very pleased with how things seem to have progressed just in the past week with a switch to the [facility] dressing. I think that has been beneficial for him. With that being said at this time the patient is concerned about his biopsy that I sent off last week unfortunately I do not have that report as of yet. Nonetheless we have called to obtain this and hopefully will hear back from the lab later this morning. 03/15/19 on evaluation today patient's wound actually appears to be doing okay today with regard to the overall appearance of the wound bed. He has been tolerating the dressing changes without complication he still has hyper granular tissue noted but fortunately that seems to be minimal at this point compared to some of what we've seen in the past. Nonetheless I do think that he is still having some issues currently with some of this type of granulation the biopsy and since all showed nothing more than just  evidence of granulation tissue. Therefore there really is nothing different to initiate or do at this point. 03/24/19 on evaluation today patient appears to be doing a little better with regard to his ulcer. He's been tolerating the dressing changes without complication. Fortunately there is no signs of active infection at this time. No fevers, chills, nausea, or vomiting noted at this time. I'm overall pleased with how things seem to be progressing. 03/29/2019 on evaluation today patient appears to be doing about the same in regard to his ulcer in the right gluteal fold. Unfortunately he is not seeming to make a lot of progress and the wound is somewhat stalled. There is no signs of active infection externally but I am concerned about the possibility of infection continuing in the ischial location which previously he did have infection noted. Again is not  able to have an MRI so probably our best option for testing for this would be a triple phase bone scan which will detect subtle changes in the bone more so than plain film x-rays. In the past they really have not been beneficial and in fact the CT scans even have been somewhat questionable at times. Nonetheless there is no signs of systemic infection which is at least good news but again his wound is not healing at all on the predicted schedule. 04/05/19 on evaluation today patient appears to be doing well all things considering with regard to his wound and the right gluteal fold. He actually has his triple bone scan scheduled for sometime in the next couple of days. With that being said I've also been looking to other possibilities of what could be causing hyper granular tissue were looking into the bone scan again for evaluating for the risk or possibility of infection deeper and I'm also gonna go ahead and see about obtaining a deep tissue Eppinger, Tyrail J. ([id number]) culture today to send and see if there's any evidence of infection noted on  culture. He's in agreement with that plan. 04/12/2019 on evaluation today patient presents for reevaluation here in our clinic concerning ongoing issues with his right gluteal fold ulcer. 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 It was discussed with him at that point that I think it would be appropriate for him to proceed with hyperbaric oxygen therapy especially in light of the fact that he is previously been on IV antibiotics at the beginning of the year for close to 3 months followed by several weeks of oral antibiotics that was all prescribed by infectious disease. He had surgical debridement around Christmas December 2019 due to an abscess and osteomyelitis of the ischial bone. Unfortunately this has not really proceeded as well as we would have liked and again we did a CT scan even a couple months ago as he cannot have an MRI secondary to having issues with both a pain pump as well as a spinal cord stimulator which prevent him from going into an MRI machine. With that being said there were chronic changes noted at the ischial tuberosity which had progressed since December 2019 there was no evidence of fluid collection on that initial CT scan. With that being said that was on January 21, 2019. I am not sure that it was a sensitive enough test as compared to an MRI and then subsequently I ordered a bone scan for him which was actually completed on 03/29/2019 and this revealed that he does indeed have positive osteomyelitis involving the right ischial tuberosity. This is adjacent to the ulcer and I think is the reason that his ulcer is not healing. Subsequently I am in a place him back on oral antibiotics today unfortunately his wound culture showed just mixed gram-negative flora with no specific findings of a predominant organism. Nonetheless being with the location I think a good  broad-spectrum antibiotic for gram-negative's is a good choice at this point he is previously taken Levaquin without significant issues and I think that is an appropriate antibiotic for him at this time. 04/19/2019 on evaluation today patient actually appears to be doing fairly well with regard to his wound. He has been tolerating the dressing changes without complication. Fortunately there is no signs of active infection and he has been taking the oral  antibiotics at this time. Subsequently we did make a referral to infectious disease although Dr. Steva Ready wants the patient to be seen by general surgery first in order apparently to see if there is anything from a surgical standpoint that should be done prior to initiation of IV antibiotic therapy. Again the patient is okay with actually seeing Dr. Celine Ahr whom he has seen before we will make that referral. 04/26/2019 on evaluation today patient actually appears to be doing well with regard to his wound. He has been tolerating the dressing changes without complication. Fortunately there is no signs of active infection at this time. I do believe that the hyperbaric oxygen therapy along with the antibiotics which I prescribed at this point have been doing well for him. With that being said he has not seen Dr. Celine Ahr the surgeon yet in fact they have not been contacted for scheduling an appointment as of yet. Subsequently the patient is not on antibiotics currently by IV Dr. Steva Ready did want him to see Dr. Celine Ahr first which we are working on trying to get scheduled. We again give the information to the patient today for Dr. Celine Ahr and her number as far as contacting their office to see about getting something scheduled. Again were looking for whether or not they recommend any surgical intervention. 05/03/2019 on evaluation today patient appears to be doing about the same with regard to his wound there may be a little bit of filling in in regard to  the base of the wound but still he has quite a bit of hyper granular tissue that I am seeing today. I believe that he may may need a more aggressive sharp debridement possibly even by the surgeon or under anesthesia in order to clear away some of the hyper granular material in order to help allow for appropriate granulation to fill in. We have made a referral to Dr. Celine Ahr unfortunately it sounds as if they did not receive the referral we contacted them today they should be get in touch with the patient. Depending on how things show from that standpoint the patient may need to see Dr. Ardyth Gal who is the infectious disease specialist although he really does not want a PICC line again. No fevers, chills, nausea, vomiting, or diarrhea. He is tolerating hyperbaric oxygen therapy very well. 05/12/2019 on evaluation today patient presents for follow-up visit concerning his right gluteal fold ulcer. He did see Dr. Celine Ahr the surgeon who previously evaluated his wound and she actually felt like he was doing quite well with regard to the wound based on what she was seen. He does seem to be responding to some degree with the oral antibiotics along with hyperbaric oxygen therapy at this point she did not see any evidence or need for further surgical intervention at this point. She recommended deferring additional or ongoing antibiotic therapy to Dr. Steva Ready at her discretion. Fortunately there is no signs of active systemic infection. No fevers, chills, nausea, vomiting, or diarrhea. Patient History Information obtained from Patient. Family History Hypertension - Father, Stroke - Mother, No family history of Cancer, Diabetes, Heart Disease, Kidney Disease, Lung Disease, Seizures, Thyroid Problems, Tuberculosis. YACOV, TAPLEY ([id number]) Social History Never smoker, Marital Status - Married, Alcohol Use - Never, Drug Use - No History, Caffeine Use - Daily. Medical History Eyes Patient has  history of Cataracts - both removed Denies history of Glaucoma, Optic Neuritis Ear/Nose/Mouth/Throat Denies history of Chronic sinus problems/congestion, Middle ear problems Hematologic/Lymphatic Denies history of Anemia, Hemophilia,  Human Immunodeficiency Virus, Lymphedema Respiratory Denies history of Aspiration, Asthma, Chronic Obstructive Pulmonary Disease (COPD), Pneumothorax, Sleep Apnea, Tuberculosis Cardiovascular Patient has history of Hypertension - takes medication Denies history of Angina, Arrhythmia, Congestive Heart Failure, Coronary Artery Disease, Deep Vein Thrombosis, Hypotension, Myocardial Infarction, Peripheral Arterial Disease, Peripheral Venous Disease, Phlebitis, Vasculitis Gastrointestinal Denies history of Cirrhosis , Colitis, Crohn s, Hepatitis A, Hepatitis B, Hepatitis C Endocrine Denies history of Type I Diabetes, Type II Diabetes Genitourinary Denies history of End Stage Renal Disease Immunological Denies history of Lupus Erythematosus, Raynaud s, Scleroderma Integumentary (Skin) Denies history of History of Burn, History of pressure wounds Musculoskeletal Denies history of Gout, Rheumatoid Arthritis, Osteoarthritis, Osteomyelitis Neurologic Patient has history of Paraplegia - waist down Denies history of Dementia, Neuropathy, Quadriplegia, Seizure Disorder Oncologic Denies history of Received Chemotherapy, Received Radiation Psychiatric Denies history of Anorexia/bulimia, Confinement Anxiety Medical And Surgical History Notes Oncologic Prostate cancer- currently treated with horomone therapy Review of Systems (ROS) Constitutional Symptoms (General Health) Denies complaints or symptoms of Fatigue, Fever, Chills, Marked Weight Change. Respiratory Denies complaints or symptoms of Chronic or frequent coughs, Shortness of Breath. Cardiovascular Denies complaints or symptoms of Chest pain, LE edema. Psychiatric Denies complaints or symptoms of  Anxiety, Claustrophobia. TYONE, HUEY ([id number]) Objective Constitutional Well-nourished and well-hydrated in no acute distress. Vitals Time Taken: 10:11 AM, Height: 73 in, Weight: 210 lbs, BMI: 27.7, Temperature: 97.9 F, Pulse: 66 bpm, Respiratory Rate: 16 breaths/min, Blood Pressure: 130/70 mmHg. Respiratory normal breathing without difficulty. clear to auscultation bilaterally. Cardiovascular regular rate and rhythm with normal S1, S2. Psychiatric this patient is able to make decisions and demonstrates good insight into disease process. Alert and Oriented x 3. pleasant and cooperative. General Notes: Upon inspection today patient's wound bed again showed signs of good granulation at this time there does not appear to be any evidence of active infection currently which is good news. He is not having any discomfort and again shows no signs of purulent drainage at this time. Integumentary (Hair, Skin) Wound #1 status is Open. Original cause of wound was Pressure Injury. The wound is located on the Right Gluteal fold. The wound measures 4cm length x 2cm width x 2.5cm depth; 6.283cm^2 area and 15.708cm^3 volume. There is muscle and Fat Layer (Subcutaneous Tissue) Exposed exposed. There is no undermining noted, however, there is tunneling at 12:00 with a maximum distance of 4.4cm. There is a large amount of serous drainage noted. The wound margin is epibole. There is medium (34-66%) red, pink, hyper - granulation within the wound bed. There is a medium (34-66%) amount of necrotic tissue within the wound bed including Adherent Slough. 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 Skin Barriers/Peri-Wound Care: Wound #1 Right Gluteal fold: Other: - Apply zinc oxide to peri-wound area. Primary Wound Dressing: Wound #1 Right Gluteal fold: Hydrafera Blue Ready Transfer Secondary Dressing: Wound #1 Right Gluteal fold: XtraSorb - Secure with Tegaderm Dressing Change Frequency: Wound #1 Right Gluteal fold: Change dressing every day. Follow-up Appointments: Wound #1 Right Gluteal fold: Return Appointment in 1 week. Off-Loading: Wound #1 Right Gluteal  fold: Turn and reposition every 2 hours Home Health: Wound #1 Right Gluteal fold: [place] for [place] Nurse may visit PRN to address patient s wound care needs. FACE TO FACE ENCOUNTER: MEDICARE and MEDICAID PATIENTS: I certify that this patient is under my care and that I had a face-to-face encounter that meets the physician face-to-face encounter requirements with this patient on this date. The encounter with the patient was in whole or in part for the following MEDICAL CONDITION: (primary reason for [place]) MEDICAL NECESSITY: I certify, that based on my findings, NURSING services are a medically necessary home health service. HOME BOUND STATUS: I certify that my clinical findings support that this patient is homebound (i.e., Due to illness or injury, pt requires aid of supportive devices such as crutches, cane, wheelchairs, walkers, the use of special transportation or the assistance of another person to leave their place of residence. There is a normal inability to leave the home and doing so requires considerable and taxing effort. Other absences are for medical reasons / religious services and are infrequent or of short duration when for other reasons). If current dressing  causes regression in wound condition, may D/C ordered dressing product/s and apply Normal Saline Moist Dressing daily until next [place] / Other MD appointment. [place] of regression in wound condition at [phone]. Please direct any NON-WOUND related issues/requests for orders to patient's Primary Care Physician Negative Pressure Wound Therapy: Wound #1 Right Gluteal fold: Wound VAC settings at 125/130 mmHg continuous pressure. Use BLACK/GREEN foam to wound cavity. Use WHITE foam to fill any tunnel/s and/or undermining. Change VAC dressing 3 X WEEK. Change canister as indicated when full. Nurse may titrate settings and frequency of dressing changes as clinically indicated. Home Health Nurse may d/c VAC for s/s of increased infection, significant wound regression, or uncontrolled drainage. [place] at [phone]. 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 My suggestion currently is good to be that we go ahead and continue with the current wound care measures including the Hydrofera Blue at this point. I do believe that it would be a good time for [place] to consider initiating the wound VAC. I believe that KEEYON, KUDRICK ([id number]) the patient would likely benefit from this and overall I think that this will help things to fill in quite nicely. Especially in light of the fact that his infection seems to be doing better I think the hyperbarics is helping as well. 2. I would recommend as well for the time being that we continue to cover  this with the XtraSorb until we get the wound VAC in place this does seem to be helping with some of the moisture control. 3. I still recommend he continue with appropriate offloading he needs to keep pressure off of the gluteal region as much and effectively as possible. I also think that using the [facility] lift for transfer is probably better than having him slide transfer when it comes to coming into the clinic for hyperbarics that way he is not having to move around even more causing this to  break seal. We will see patient back for reevaluation in 1 week here in the clinic. If anything worsens or changes patient will contact our office for additional recommendations. Electronic Signature(s) Signed: 05/12/2019 11:05:09 AM By: Worthy Keeler [name] Entered By: Worthy Keeler on 05/12/2019 11:05:08 COLDEN, CZERNIAK ([id number]) -------------------------------------------------------------------------------- ROS/PFSH Details Patient Name: [name] Date of Service: 05/12/2019 10:30 AM Medical Record Number: [id number] Patient Account Number: [id number] Date of Birth/Sex: 10/19/1950 (68 y.o. M) Treating RN: Army Melia Primary Care Provider: [name] Other Clinician: Referring Provider: [name] Treating Provider/Extender: [name], Monti Jilek Weeks in Treatment: 74 Information Obtained From Patient Constitutional Symptoms (General Health) Complaints and Symptoms: Negative for: Fatigue; Fever; Chills; Marked Weight Change Respiratory Complaints and Symptoms: Negative for: Chronic or frequent coughs; Shortness of Breath Medical History: Negative for: Aspiration; Asthma; Chronic Obstructive Pulmonary Disease (COPD); Pneumothorax; Sleep Apnea; Tuberculosis Cardiovascular Complaints and Symptoms: Negative for: Chest pain; LE edema Medical History: Positive for: Hypertension - takes medication Negative for: Angina; Arrhythmia; Congestive Heart Failure; Coronary  Artery Disease; Deep Vein Thrombosis; Hypotension; Myocardial Infarction; Peripheral Arterial Disease; Peripheral Venous Disease; Phlebitis; Vasculitis Psychiatric Complaints and Symptoms: Negative for: Anxiety; Claustrophobia Medical History: Negative for: Anorexia/bulimia; Confinement Anxiety Eyes Medical History: Positive for: Cataracts - both removed Negative for: Glaucoma; Optic Neuritis Ear/Nose/Mouth/Throat Medical History: Negative for: Chronic sinus problems/congestion; Middle ear problems Hematologic/Lymphatic Medical History: Negative for: Anemia; Hemophilia; Human Immunodeficiency Virus; Lymphedema RICHAR, YOUNGBLOOD. ([id number]) Gastrointestinal Medical History: Negative for: Cirrhosis ; Colitis; Crohnos; Hepatitis A; Hepatitis B; Hepatitis C Endocrine Medical History: Negative for: Type I Diabetes; Type II Diabetes Genitourinary Medical History: Negative for: End Stage Renal Disease Immunological Medical History: Negative for: Lupus Erythematosus; Raynaudos; Scleroderma Integumentary (Skin) Medical History: Negative for: History of Burn; History of pressure wounds Musculoskeletal Medical History: Negative for: Gout; Rheumatoid Arthritis; Osteoarthritis; Osteomyelitis Neurologic Medical History: Positive for: Paraplegia - waist down Negative for: Dementia; Neuropathy; Quadriplegia; Seizure Disorder Oncologic Medical History: Negative for: Received Chemotherapy; Received Radiation Past Medical History Notes: Prostate cancer- currently treated with horomone therapy HBO Extended History Items Eyes: Cataracts Immunizations Pneumococcal Vaccine: Received Pneumococcal Vaccination: No Implantable Devices No devices added Family and Social History Cancer: No; Diabetes: No; Heart Disease: No; Hypertension: Yes - Father; Kidney Disease: No; Lung Disease: No; Seizures: No; Stroke: Yes - Mother; Thyroid Problems: No; Tuberculosis: No; Never smoker; Marital  Status - Married; Alcohol Use: Never; Drug Use: No History; Caffeine Use: Daily CADY, TOAL ([id number]) Physician Affirmation I have reviewed and agree with the above information. Electronic Signature(s) Signed: 05/12/2019 1:28:54 PM By: Army Melia Signed: 05/12/2019 6:36:47 PM By: Worthy Keeler [name] Entered By: Worthy Keeler on 05/12/2019 11:02:59 COEHN, BANNER ([id number]) -------------------------------------------------------------------------------- SuperBill Details Patient Name: [name] Date of Service: 05/12/2019 Medical Record Number: [id number] Patient Account Number: [id number] Date of Birth/Sex: 1951/03/15 (68 y.o. 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

## 2019-05-12 NOTE — Progress Notes (Signed)
TRESTIN, MORELAN ([id number]) Visit Report for 05/12/2019 HBO Details Patient Name: [name], [name] Date of Service: 05/12/2019 8:00 AM Medical Record Number: [id number] Patient Account Number: [id number] Date of Birth/Sex: [birth year] (68 y.o. 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 HBO Treatment Details Treatment Number: 18 Patient Type: Outpatient Chamber Type: Monoplace Chamber Serial #: [id number] Treatment Protocol: 2.0 ATA with 90 minutes oxygen, and no air breaks Treatment Details Compression Rate Down: 1.5 psi / minute De-Compression Rate Up: 2.0 psi / minute Compress Tx Pressure Air breaks and breathing periods Decompress Decompress Begins Reached (leave unused spaces blank) Begins Ends Chamber Pressure (ATA) 1 2 - - - - - - 2 1 Clock Time (24 hr) 08:12 08:24 - - - - - - 09:54 10:04 Treatment Length: 112 (minutes) Treatment Segments: 4 Vital Signs Capillary Blood Glucose Reference Range: 80 - 120 mg / dl HBO Diabetic Blood Glucose Intervention Range: <131 mg/dl or >249 mg/dl Time Vitals Blood Respiratory Capillary Blood Glucose Pulse Action Type: Pulse: Temperature: Taken: Pressure: Rate: Glucose (mg/dl): Meter #: Oximetry (%) Taken: Pre 08:10 122/70 78 16 98.2 Post 10:11 130/70 66 16 97.9 Treatment Response Treatment Toleration: Well Treatment Completion Treatment Completed without Adverse Event Status: HBO Attestation I certify that I supervised this HBO treatment in accordance with Medicare guidelines. A trained emergency response Yes team is readily available per hospital policies  and procedures. Continue HBOT as ordered. Yes Electronic Signature(s) [name], [name] ([id number]) Signed: 05/12/2019 11:05:48 AM By: Worthy Keeler [name] Previous Signature: 05/12/2019 10:54:08 AM Version By: Lorine Bears RCP, RRT, CHT Entered By: Worthy Keeler on 05/12/2019 11:05:48 [name], [name] ([id number]) -------------------------------------------------------------------------------- HBO Safety Checklist Details Patient Name: [name] Date of Service: 05/12/2019 8:00 AM Medical Record Number: [id number] Patient Account Number: [id number] Date of Birth/Sex: [birth year] (68 y.o. M) Treating RN: Primary Care Mackinley Cassaday: [name] Other Clinician: Referring Cailyn Houdek: [name] Treating Hayden Kihara/Extender: [name], HOYT Weeks in Treatment: 75 HBO Safety Checklist Items Safety Checklist Consent Form Signed Patient voided / foley secured and emptied When did you last eato morning Last dose of injectable or oral agent n/a Ostomy pouch emptied and vented if applicable NA Medtronic Baclofen Pump; Nevro All implantable devices assessed, documented and approved Spinal Stimulator Intravenous access site secured and place NA Valuables secured Linens and cotton and cotton/polyester blend (less than 51% polyester) Personal oil-based products / skin lotions / body lotions removed Wigs or hairpieces removed NA Smoking or tobacco materials removed NA Books / newspapers / magazines / loose paper removed Cologne, aftershave, perfume and deodorant removed Jewelry removed (may wrap wedding band) Make-up removed NA Hair care products removed Battery operated devices (external) removed Heating patches and chemical warmers removed Titanium eyewear removed NA Nail polish cured greater than 10 hours NA Casting material cured greater than 10 hours NA Hearing aids removed NA Loose dentures or partials removed NA Prosthetics have been  removed NA Patient demonstrates correct use of air break device (if applicable) Patient concerns have been addressed Patient grounding bracelet on and cord attached to chamber Specifics for Inpatients (complete in addition to above) Medication sheet sent with patient Intravenous medications needed or due during therapy sent with patient Drainage tubes (e.g.  nasogastric tube or chest tube secured and vented) Endotracheal or Tracheotomy tube secured Cuff deflated of air and inflated with saline Airway suctioned Electronic Signature(s) [name], [name] ([id number]) Signed: 05/12/2019 10:54:08 AM By: Lorine Bears RCP, RRT, CHT Entered By: Lorine Bears on 05/12/2019 08:43:33

## 2019-05-12 NOTE — Progress Notes (Signed)
IZEKIEL, GERARDOT ([id number]) Visit Report for 05/12/2019 Arrival Information Details Patient Name: [name], [name] Date of Service: 05/12/2019 8:00 AM Medical Record Number: [id number] Patient Account Number: [id number] Date of Birth/Sex: 10/18/1950 (68 y.o. M) Treating RN: Primary Care Abbee Cremeens: [name] Other Clinician: Referring Aerie Donica: [name] Treating Mete Purdum/Extender: [name], HOYT Weeks in Treatment: 75 Visit Information History Since Last Visit Added or deleted any medications: No Patient Arrived: Wheel Chair Any new allergies or adverse reactions: No Arrival Time: 08:23 Had a fall or experienced change in No Accompanied By: wife activities of daily living that may affect Transfer Assistance: Harrel Lemon Lift risk of falls: Patient Identification Verified: Yes Signs or symptoms of abuse/neglect since last visito No Secondary Verification Process Completed: Yes Hospitalized since last visit: No Patient Requires Transmission-Based No Implantable device outside of the clinic excluding No Precautions: cellular tissue based products placed in the center Patient Has Alerts: Yes since last visit: Patient Alerts: NOT Has Dressing in Place as Prescribed: Yes Diabetic Pain Present Now: No Electronic Signature(s) Signed: 05/12/2019 10:54:08 AM By: Lorine Bears RCP, RRT, CHT Entered By: Lorine Bears on 05/12/2019 08:41:15 KERIC, LANCLOS ([id number]) -------------------------------------------------------------------------------- Encounter Discharge Information Details Patient Name: [name] Date of Service: 05/12/2019 8:00 AM Medical Record Number: [id number] Patient Account Number: [id number] Date of Birth/Sex: [birth year] (68 y.o. M) Treating RN: Primary Care Reola Buckles: [name] Other Clinician: Referring Gerron Guidotti: [name] Treating Saylor Murry/Extender: [name], HOYT Weeks in Treatment:  48 Encounter Discharge Information Items Discharge Condition: Stable Ambulatory Status: Wheelchair Discharge Destination: Home Transportation: Private Auto Accompanied By: wife Schedule Follow-up Appointment: Yes Clinical Summary of Care: Notes Patient has an HBO treatment scheduled on 05/13/2019 at 08:00 am. Electronic Signature(s) Signed: 05/12/2019 10:54:08 AM By: Lorine Bears RCP, RRT, CHT Entered By: Lorine Bears on 05/12/2019 10:28:26 SANDRO, CRISPIN ([id number]) -------------------------------------------------------------------------------- Vitals Details Patient Name: [name] Date of Service: 05/12/2019 8:00 AM Medical Record Number: [id number] Patient Account Number: [id number] Date of Birth/Sex: 01/14/1951 (68 y.o. M) Treating RN: Primary Care Halla Chopp: [name] Other Clinician: Referring Sybol Morre: [name] Treating Ersa Delaney/Extender: [name], HOYT Weeks in Treatment: 75 Vital Signs Time Taken: 08:10 Temperature (F): 98.2 Height (in): 73 Pulse (bpm): 78 Weight (lbs): 210 Respiratory Rate (breaths/min): 16 Body Mass Index (BMI): 27.7 Blood Pressure (mmHg): 122/70 Reference Range: 80 - 120 mg / dl Electronic Signature(s) Signed: 05/12/2019 10:54:08 AM By: Lorine Bears RCP, RRT, CHT Entered By: Lorine Bears on 05/12/2019 08:42:16

## 2019-05-12 NOTE — Progress Notes (Signed)
SAFARI, WALSINGHAM ([id number]) Visit Report for 05/12/2019 Problem List Details Patient Name: MONTAY, GERTON Date of Service: 05/12/2019 8:00 AM Medical Record Number: [id number] Patient Account Number: [id number] Date of Birth/Sex: 1950/12/09 (68 y.o. 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, sequela I10 Essential (primary) hypertension Facility Procedures CPT4 Code:  [id number] Description: (Facility Use Only) HBOT, full body chamber, 20min Modifier: Quantity: 4 Physician Procedures CPT4 Code: [id number] Description: [id number] - WC PHYS HYPERBARIC OXYGEN THERAPY ICD-10 Diagnosis Description M86.68 Other chronic osteomyelitis, other site L89.314 Pressure ulcer of right buttock, stage 4 L03.317 Cellulitis of buttock G82.20 Paraplegia, unspecified Modifier: Quantity: 1 Electronic Signature(s) Signed: 05/12/2019 11:05:53 AM By: Worthy Keeler [name] Previous Signature: 05/12/2019 10:54:08 AM Version By: Lorine Bears RCP, RRT, CHT Entered By: Worthy Keeler on 05/12/2019 11:05:53

## 2019-05-13 ENCOUNTER — Encounter: Payer: Medicare Other | Admitting: Physician Assistant

## 2019-05-13 ENCOUNTER — Other Ambulatory Visit: Payer: Self-pay

## 2019-05-13 DIAGNOSIS — L89314 Pressure ulcer of right buttock, stage 4: Secondary | ICD-10-CM | POA: Diagnosis not present

## 2019-05-13 NOTE — Progress Notes (Signed)
[name], [name] ([id number]) Visit Report for 05/13/2019 Arrival Information Details Patient Name: [name], [name] Date of Service: 05/13/2019 8:00 AM Medical Record Number: [id number] Patient Account Number: [id number] Date of Birth/Sex: [birth year] (68 y.o. M) Treating RN: Montey Hora Primary Care Ojani Berenson: [name] Other Clinician: [name] Referring Daray Polgar: [name] Treating Katelynd Blauvelt/Extender: [name], HOYT Weeks in Treatment: 44 Visit Information History Since Last Visit Added or deleted any medications: No Patient Arrived: Wheel Chair Any new allergies or adverse reactions: No Arrival Time: 09:19 Had a fall or experienced change in No Accompanied By: wife activities of daily living that may affect Transfer Assistance: Harrel Lemon Lift risk of falls: Patient Identification Verified: Yes Signs or symptoms of abuse/neglect since last visito No Secondary Verification Process Completed: Yes Hospitalized since last visit: No Patient Requires Transmission-Based No Implantable device outside of the clinic excluding No Precautions: cellular tissue based products placed in the center Patient Has Alerts: Yes since last visit: Patient Alerts: NOT Has Dressing in Place as Prescribed: Yes Diabetic Pain Present Now: No Electronic Signature(s) Signed: 05/13/2019 11:24:51 AM By: Lorine Bears RCP, RRT, CHT Entered By: Lorine Bears on 05/13/2019 09:35:40 [name] ([id number]) -------------------------------------------------------------------------------- Encounter Discharge Information Details Patient Name: [name] Date of Service: 05/13/2019 8:00 AM Medical Record Number: [id number] Patient Account Number: [id number] Date of Birth/Sex: Dec 08, 1950 (68 y.o. M) Treating RN: Montey Hora Primary Care Golden Gilreath: [name] Other Clinician: [name] Referring Terrica Duecker: [name] Treating Damiana Berrian/Extender: [name], HOYT Weeks in Treatment: 52 Encounter Discharge Information Items Discharge Condition: Stable Ambulatory Status: Wheelchair Discharge Destination: Home Transportation: Private Auto Accompanied By: wife Schedule Follow-up Appointment: Yes Clinical Summary of Care: Notes Patient has an HBO treatment scheduled on 05/18/2019 at 08:00 am. Electronic Signature(s) Signed: 05/13/2019 11:24:51 AM By: Lorine Bears RCP, RRT, CHT Entered By: Lorine Bears on 05/13/2019 10:31:27 [name], [name] ([id number]) -------------------------------------------------------------------------------- Vitals Details Patient Name: [name] Date of Service: 05/13/2019 8:00 AM Medical Record Number: [id number] Patient Account Number: [id number] Date of Birth/Sex: 02-29-1951 (68 y.o. M) Treating RN: Montey Hora Primary Care Taiwo Fish: [name] Other Clinician: [name] Referring Marili Vader: [name] Treating Teresita Fanton/Extender: [name], HOYT Weeks in Treatment: 47 Vital Signs Time Taken: 07:56 Temperature (F): 98.6 Height (in): 73 Pulse (bpm): 66 Weight (lbs): 210 Respiratory Rate (breaths/min): 16 Body Mass Index (BMI): 27.7 Blood Pressure (mmHg): 122/72 Reference Range: 80 - 120 mg / dl Electronic Signature(s) Signed: 05/13/2019 11:24:51 AM By: Lorine Bears RCP, RRT, CHT Entered By: Lorine Bears on 05/13/2019 09:36:16

## 2019-05-14 NOTE — Progress Notes (Signed)
TRESTON, HORVAT ([id number]) Visit Report for 05/13/2019 Problem List Details Patient Name: [name], [name] Date of Service: 05/13/2019 8:00 AM Medical Record Number: [id number] Patient Account Number: [id number] Date of Birth/Sex: 01-04-1951 (68 y.o. 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, sequela I10  Essential (primary) hypertension Facility Procedures CPT4 Code: [id number] Description: (Facility Use Only) HBOT, full body chamber, 68min Modifier: Quantity: 4 Physician Procedures CPT4 Code: [id number] Description: [id number] - WC PHYS HYPERBARIC OXYGEN THERAPY ICD-10 Diagnosis Description M86.68 Other chronic osteomyelitis, other site L89.314 Pressure ulcer of right buttock, stage 4 L03.317 Cellulitis of buttock G82.20 Paraplegia, unspecified Modifier: Quantity: 1 Electronic Signature(s) Signed: 05/14/2019 1:07:32 AM By: Worthy Keeler [name] Previous Signature: 05/13/2019 11:24:51 AM Version By: Lorine Bears RCP, RRT, CHT Entered By: Worthy Keeler on 05/13/2019 21:14:20

## 2019-05-14 NOTE — Progress Notes (Signed)
[name], [name] ([id number]) Visit Report for 05/13/2019 HBO Details Patient Name: [name], [name] Date of Service: 05/13/2019 8:00 AM Medical Record Number: [id number] Patient Account Number: [id number] Date of Birth/Sex: 06-Dec-1950 (68 y.o. 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 HBO Treatment Details Treatment Number: 19 Patient Type: Outpatient Chamber Type: Monoplace Chamber Serial #: [id number] Treatment Protocol: 2.0 ATA with 90 minutes oxygen, and no air breaks Treatment Details Compression Rate Down: 1.5 psi / minute De-Compression Rate Up: 2.0 psi / minute Compress Tx Pressure Air breaks and breathing periods Decompress Decompress Begins Reached (leave unused spaces blank) Begins Ends Chamber Pressure (ATA) 1 2 - - - - - - 2 1 Clock Time (24 hr) 08:14 08:25 - - - - - - 09:56 10:04 Treatment Length: 110 (minutes) Treatment Segments: 4 Vital Signs Capillary Blood Glucose Reference Range: 80 - 120 mg / dl HBO Diabetic Blood Glucose Intervention Range: <131 mg/dl or >249 mg/dl Time Vitals Blood Respiratory Capillary Blood Glucose Pulse Action Type: Pulse: Temperature: Taken: Pressure: Rate: Glucose (mg/dl): Meter #: Oximetry (%) Taken: Pre 07:56 122/72 66 16 98.6 Post 10:09 118/64 60 16 97.8 Treatment Response Treatment Toleration: Well Treatment Completion Treatment Completed without Adverse Event Status: HBO Attestation I certify that I supervised this HBO treatment in accordance with Medicare guidelines. A trained emergency response Yes team is readily  available per hospital policies and procedures. Continue HBOT as ordered. Yes Electronic Signature(s) KELLIN, TRIMBACH ([id number]) Signed: 05/14/2019 1:07:32 AM By: Worthy Keeler [name] Previous Signature: 05/13/2019 11:24:51 AM Version By: Lorine Bears RCP, RRT, CHT Entered By: Worthy Keeler on 05/13/2019 21:14:11 MADDAX, RABINE ([id number]) -------------------------------------------------------------------------------- HBO Safety Checklist Details Patient Name: [name] Date of Service: 05/13/2019 8:00 AM Medical Record Number: [id number] Patient Account Number: [id number] Date of Birth/Sex: 06/06/1951 (68 y.o. M) Treating RN: Montey Hora Primary Care Kylle Lall: [name] Other Clinician: [name] Referring Alvino Lechuga: [name] Treating Elige Shouse/Extender: [name], HOYT Weeks in Treatment: 75 HBO Safety Checklist Items Safety Checklist Consent Form Signed Patient voided / foley secured and emptied When did you last eato morning Last dose of injectable or oral agent n/a Ostomy pouch emptied and vented if applicable NA Medtronic Baclofen Pump; Nevro All implantable devices assessed, documented and approved Spinal Stimulator Intravenous access site secured and place NA Valuables secured Linens and cotton and cotton/polyester blend (less than 51% polyester) Personal oil-based products / skin lotions / body lotions removed Wigs or hairpieces removed NA Smoking or tobacco materials removed NA Books / newspapers / magazines / loose paper removed Cologne, aftershave, perfume and deodorant removed Jewelry removed (may wrap wedding band) Make-up removed NA Hair care products removed Battery operated devices (external) removed Heating patches and chemical warmers removed Titanium eyewear removed NA Nail polish cured greater than 10 hours NA Casting material cured greater than 10 hours NA Hearing aids  removed NA Loose dentures or partials removed NA Prosthetics have been removed NA Patient demonstrates correct use of air break device (if applicable) Patient concerns have been addressed Patient grounding bracelet on and cord attached to chamber Specifics for Inpatients (complete in addition to above) Medication sheet sent with patient Intravenous medications needed or due  during therapy sent with patient Drainage tubes (e.g. nasogastric tube or chest tube secured and vented) Endotracheal or Tracheotomy tube secured Cuff deflated of air and inflated with saline Airway suctioned Electronic Signature(s) SANJIT, SCIANDRA ([id number]) Signed: 05/13/2019 11:24:51 AM By: Lorine Bears RCP, RRT, CHT Entered By: Lorine Bears on 05/13/2019 09:37:01

## 2019-05-16 ENCOUNTER — Encounter: Payer: Medicare Other | Admitting: Physician Assistant

## 2019-05-17 ENCOUNTER — Encounter: Payer: Medicare Other | Admitting: Physician Assistant

## 2019-05-18 ENCOUNTER — Other Ambulatory Visit: Payer: Self-pay

## 2019-05-18 ENCOUNTER — Encounter: Payer: Medicare Other | Admitting: Internal Medicine

## 2019-05-18 DIAGNOSIS — L89314 Pressure ulcer of right buttock, stage 4: Secondary | ICD-10-CM | POA: Diagnosis not present

## 2019-05-18 NOTE — Progress Notes (Signed)
[name], [name] ([id number]) Visit Report for 05/18/2019 Arrival Information Details Patient Name: [name], [name] Date of Service: 05/18/2019 8:00 AM Medical Record Number: [id number] Patient Account Number: [id number] Date of Birth/Sex: February 18, 1951 (68 y.o. M) Treating RN: [name] Primary Care [name]: [name] Other Clinician: [name] Referring [name]: [name] Treating [name]: 84 Visit Information History Since Last Visit Added or deleted any medications: No Patient Arrived: Wheel Chair Any new allergies or adverse reactions: No Arrival Time: 08:37 Had a fall or experienced change in No Accompanied By: wife activities of daily living that may affect Transfer Assistance: Harrel Lemon Lift risk of falls: Patient Identification Verified: Yes Signs or symptoms of abuse/neglect since last visito No Secondary Verification Process Completed: Yes Hospitalized since last visit: No Patient Requires Transmission-Based No Implantable device outside of the clinic excluding No Precautions: cellular tissue based products placed in the center Patient Has Alerts: Yes since last visit: Patient Alerts: NOT Has Dressing in Place as Prescribed: Yes Diabetic Pain Present Now: No Electronic Signature(s) Signed: 05/18/2019 11:34:34 AM By: Lorine Bears RCP, RRT, CHT Entered By: Lorine Bears on 05/18/2019 08:45:48 [name], [name] ([id number]) -------------------------------------------------------------------------------- Encounter Discharge Information Details Patient Name: [name] Date of Service: 05/18/2019 8:00 AM Medical Record Number: [id number] Patient Account Number: [id number] Date of Birth/Sex: March 24, 1951 (68 y.o. M) Treating RN: [name] Primary Care [name]: [name] Other Clinician: [name] Referring [name]: [name] Treating Christiana Gurevich/Extender: [name] in Treatment: 102 Encounter Discharge Information Items Discharge Condition: Stable Ambulatory Status: Wheelchair Discharge Destination: Home Transportation: Private Auto Accompanied By: wife Schedule Follow-up Appointment: Yes Clinical Summary of Care: Notes Patient has an HBO treatment scheduled on 05/19/2019 at 13:00 pm. Electronic Signature(s) Signed: 05/18/2019 11:34:34 AM By: Lorine Bears RCP, RRT, CHT Entered By: Lorine Bears on 05/18/2019 10:32:39 [name], [name] ([id number]) -------------------------------------------------------------------------------- Vitals Details Patient Name: [name] Date of Service: 05/18/2019 8:00 AM Medical Record Number: [id number] Patient Account Number: [id number] Date of Birth/Sex: [birth year] (68 y.o. M) Treating RN: [name] Primary Care [name]: [name] Other Clinician: [name] Referring Myriam Brandhorst: [name] Treating [name]/Extender: [name] in Treatment: 58 Vital Signs Time Taken: 08:10 Temperature (F): 98.1 Height (in): 73 Pulse (bpm): 66 Weight (lbs): 210 Respiratory Rate (breaths/min): 16 Body Mass Index (BMI): 27.7 Blood Pressure (mmHg): 116/64 Reference Range: 80 - 120 mg / dl Electronic Signature(s) Signed: 05/18/2019 11:34:34 AM By: Lorine Bears RCP, RRT, CHT Entered By: Lorine Bears on 05/18/2019 08:46:12

## 2019-05-19 ENCOUNTER — Encounter: Payer: Medicare Other | Admitting: Internal Medicine

## 2019-05-19 DIAGNOSIS — L89314 Pressure ulcer of right buttock, stage 4: Secondary | ICD-10-CM | POA: Diagnosis not present

## 2019-05-19 NOTE — Progress Notes (Signed)
RAZIEL, CAMIRE ([id number]) Visit Report for 05/18/2019 HBO Details Patient Name: [name], [name] Date of Service: 05/18/2019 8:00 AM Medical Record Number: [id number] Patient Account Number: [id number] Date of Birth/Sex: [birth year] (68 y.o. 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 HBO Treatment Details Treatment Number: 20 Patient Type: Outpatient Chamber Type: Monoplace Chamber Serial #: [id number] Treatment Protocol: 2.0 ATA with 90 minutes oxygen, and no air breaks Treatment Details Compression Rate Down: 1.5 psi / minute De-Compression Rate Up: 1.5 psi / minute Compress Tx Pressure Air breaks and breathing periods Decompress Decompress Begins Reached (leave unused spaces blank) Begins Ends Chamber Pressure (ATA) 1 2 - - - - - - 2 1 Clock Time (24 hr) 08:12 08:24 - - - - - - 09:55 10:05 Treatment Length: 113 (minutes) Treatment Segments: 4 Vital Signs Capillary Blood Glucose Reference Range: 80 - 120 mg / dl HBO Diabetic Blood Glucose Intervention Range: <131 mg/dl or >249 mg/dl Time Vitals Blood Respiratory Capillary Blood Glucose Pulse Action Type: Pulse: Temperature: Taken: Pressure: Rate: Glucose (mg/dl): Meter #: Oximetry (%) Taken: Pre 08:10 116/64 66 16 98.1 Post 10:10 120/60 66 16 97.8 Treatment Response Treatment Completion Status: Treatment Completed without Adverse Event Julitza Rickles Notes No concerns with treatment given HBO Attestation I certify that I supervised this HBO treatment in accordance with Medicare guidelines. A trained emergency response  Yes team is readily available per hospital policies and procedures. Continue HBOT as ordered. Yes Electronic Signature(s) KARTIER, ATER ([id number]) Signed: 05/18/2019 5:54:02 PM By: Linton Ham MD Previous Signature: 05/18/2019 11:34:34 AM Version By: Lorine Bears RCP, RRT, CHT Entered By: Linton Ham on 05/18/2019 17:52:10 QUEST, HANDRICH ([id number]) -------------------------------------------------------------------------------- HBO Safety Checklist Details Patient Name: [name] Date of Service: 05/18/2019 8:00 AM Medical Record Number: [id number] Patient Account Number: [id number] Date of Birth/Sex: 1951-06-24 (68 y.o. M) Treating RN: [name] Primary Care Loralai Eisman: [name] Other Clinician: [name] Referring Sriya Kroeze: [name] Treating Marykay Mccleod/Extender: [name] in Treatment: 9 HBO Safety Checklist Items Safety Checklist Consent Form Signed Patient voided / foley secured and emptied When did you last eato morning Last dose of injectable or oral agent n/a Ostomy pouch emptied and vented if applicable NA Medtronic Baclofen Pump; Nevro All implantable devices assessed, documented and approved Spinal Stimulator Intravenous access site secured and place NA Valuables secured Linens and cotton and cotton/polyester blend (less than 51% polyester) Personal oil-based products / skin lotions / body lotions removed Wigs or hairpieces removed NA Smoking or tobacco materials removed NA Books / newspapers / magazines / loose paper removed Cologne, aftershave, perfume and deodorant removed Jewelry removed (may wrap wedding band) Make-up removed NA Hair care products removed Battery operated devices (external) removed Heating patches and chemical warmers removed Titanium eyewear removed NA Nail polish cured greater than 10 hours NA Casting material cured greater than 10  hours NA Hearing aids removed NA Loose dentures or partials removed NA Prosthetics have been removed NA Patient demonstrates correct use of air break device (if applicable) Patient concerns have been addressed Patient grounding bracelet on and cord attached to chamber Specifics for Inpatients (complete in addition to above) Medication sheet sent with patient Intravenous medications needed  or due during therapy sent with patient Drainage tubes (e.g. nasogastric tube or chest tube secured and vented) Endotracheal or Tracheotomy tube secured Cuff deflated of air and inflated with saline Airway suctioned Electronic Signature(s) CETH, MULLADY ([id number]) Signed: 05/18/2019 11:34:34 AM By: Lorine Bears RCP, RRT, CHT Entered By: Lorine Bears on 05/18/2019 08:47:06

## 2019-05-19 NOTE — Progress Notes (Signed)
[name], [name] ([id number]) Visit Report for 05/19/2019 Arrival Information Details Patient Name: [name], [name] Date of Service: 05/19/2019 1:00 PM Medical Record Number: [id number] Patient Account Number: [id number] Date of Birth/Sex: 1951/01/20 (68 y.o. M) Treating RN: Army Melia Primary Care Americus Scheurich: [name] Other Clinician: [name] Referring Londell Noll: [name] Treating Tayt Moyers/Extender: [name] in Treatment: 73 Visit Information History Since Last Visit Added or deleted any medications: No Patient Arrived: Wheel Chair Any new allergies or adverse reactions: No Arrival Time: 12:45 Had a fall or experienced change in No Accompanied By: daughter, activities of daily living that may affect Joelene Millin risk of falls: Transfer Assistance: [employer] Signs or symptoms of abuse/neglect since last visito No Patient Identification Verified: Yes Hospitalized since last visit: No Secondary Verification Process Yes Implantable device outside of the clinic excluding No Completed: cellular tissue based products placed in the center Patient Requires Transmission-Based No since last visit: Precautions: Has Dressing in Place as Prescribed: Yes Patient Has Alerts: Yes Pain Present Now: No Patient Alerts: NOT Diabetic Electronic Signature(s) Signed: 05/19/2019 3:18:29 PM By: Lorine Bears RCP, RRT, CHT Entered By: Lorine Bears on 05/19/2019 13:14:23 [name], [name] ([id number]) -------------------------------------------------------------------------------- Encounter Discharge Information Details Patient Name: [name] Date of Service: 05/19/2019 1:00 PM Medical Record Number: [id number] Patient Account Number: [id number] Date of Birth/Sex: [birth year] (68 y.o. M) Treating RN: Army Melia Primary Care Larz Mark: [name] Other Clinician: [name] Referring Jessly Lebeck:  [name] Treating Doni Bacha/Extender: [name] in Treatment: 52 Encounter Discharge Information Items Discharge Condition: Stable Ambulatory Status: Wheelchair Discharge Destination: Home Transportation: Private Auto Accompanied By: wife Schedule Follow-up Appointment: Yes Clinical Summary of Care: Notes Patient has an HBO treatment scheduled on 05/20/2019 at 08:00 am. Electronic Signature(s) Signed: 05/19/2019 3:18:29 PM By: Lorine Bears RCP, RRT, CHT Entered By: Lorine Bears on 05/19/2019 15:10:44 [name] ([id number]) -------------------------------------------------------------------------------- Vitals Details Patient Name: [name] Date of Service: 05/19/2019 1:00 PM Medical Record Number: [id number] Patient Account Number: [id number] Date of Birth/Sex: 05-Feb-1951 (68 y.o. M) Treating RN: Army Melia Primary Care Javonni Macke: [name] Other Clinician: [name] Referring Josie Mesa: [name] Treating Majestic Molony/Extender: [name] in Treatment: 44 Vital Signs Time Taken: 12:55 Temperature (F): 97.9 Height (in): 73 Pulse (bpm): 72 Weight (lbs): 210 Respiratory Rate (breaths/min): 16 Body Mass Index (BMI): 27.7 Blood Pressure (mmHg): 108/64 Reference Range: 80 - 120 mg / dl Electronic Signature(s) Signed: 05/19/2019 3:18:29 PM By: Lorine Bears RCP, RRT, CHT Entered By: Lorine Bears on 05/19/2019 13:14:59

## 2019-05-19 NOTE — Progress Notes (Signed)
[name], [name] ([id number]) Visit Report for 05/19/2019 HBO Details Patient Name: [name], [name] Date of Service: 05/19/2019 1:00 PM Medical Record Number: [id number] Patient Account Number: [id number] Date of Birth/Sex: Mar 08, 1951 (68 y.o. 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 HBO Treatment Details Treatment Number: 21 Patient Type: Outpatient Chamber Type: Monoplace Chamber Serial #: [id number] Treatment Protocol: 2.0 ATA with 90 minutes oxygen, and no air breaks Treatment Details Compression Rate Down: 1.5 psi / minute De-Compression Rate Up: 1.5 psi / minute Compress Tx Pressure Air breaks and breathing periods Decompress Decompress Begins Reached (leave unused spaces blank) Begins Ends Chamber Pressure (ATA) 1 2 - - - - - - 2 1 Clock Time (24 hr) 13:02 13:14 - - - - - - 14:44 14:54 Treatment Length: 112 (minutes) Treatment Segments: 4 Vital Signs Capillary Blood Glucose Reference Range: 80 - 120 mg / dl HBO Diabetic Blood Glucose Intervention Range: <131 mg/dl or >249 mg/dl Time Vitals Blood Respiratory Capillary Blood Glucose Pulse Action Type: Pulse: Temperature: Taken: Pressure: Rate: Glucose (mg/dl): Meter #: Oximetry (%) Taken: Pre 12:55 108/64 72 16 97.9 Post 15:05 132/82 60 16 98.1 Treatment Response Treatment Completion Status: Treatment Completed without Adverse Event [name] Notes No concerns with treatment given. The patient was also seen for wound care evaluation HBO Attestation I certify that I supervised this HBO treatment in accordance with  Medicare guidelines. A trained emergency response Yes team is readily available per hospital policies and procedures. Continue HBOT as ordered. Yes Electronic Signature(s) Brett, [name] ([id number]) Signed: 05/19/2019 5:39:42 PM By: Brett Ham MD Previous Signature: 05/19/2019 3:18:29 PM Version By: [name] RCP, RRT, CHT Entered By: [name] on 05/19/2019 17:37:48 [name], [name] ([id number]) -------------------------------------------------------------------------------- HBO Safety Checklist Details Patient Name: [name] Date of Service: 05/19/2019 1:00 PM Medical Record Number: [id number] Patient Account Number: [id number] Date of Birth/Sex: 19-Jul-1951 (68 y.o. M) Treating RN: [name] Primary Care [name]: [name] Other Clinician: [name] Referring [name]: [name] [name] in Treatment: 57 HBO Safety Checklist Items Safety Checklist Consent Form Signed Patient voided / foley secured and emptied When did you last eato lunch Last dose of injectable or oral agent n/a Ostomy pouch emptied and vented if applicable NA Medtronic Baclofen Pump; Nevro All implantable devices assessed, documented and approved Spinal Stimulator Intravenous access site secured and place NA Valuables secured Linens and cotton and cotton/polyester blend (less than 51% polyester) Personal oil-based products / skin lotions / body lotions removed Wigs or hairpieces removed NA Smoking or tobacco materials removed NA Books / newspapers / magazines / loose paper removed Cologne, aftershave, perfume and deodorant removed Jewelry removed (may wrap wedding band) Make-up removed NA Hair care products removed Battery operated devices (external) removed Heating patches and chemical warmers removed Titanium eyewear removed NA Nail polish cured greater than 10 hours NA Casting  material cured greater than 10 hours NA Hearing aids removed NA Loose dentures or partials removed NA Prosthetics have been removed NA Patient demonstrates correct use of air break device (if applicable) Patient concerns have been addressed Patient grounding bracelet on and cord attached to chamber Specifics for Inpatients (complete in addition to  above) Medication sheet sent with patient Intravenous medications needed or due during therapy sent with patient Drainage tubes (e.g. nasogastric tube or chest tube secured and vented) Endotracheal or Tracheotomy tube secured Cuff deflated of air and inflated with saline Airway suctioned Electronic Signature(s) Brett, [name] ([id number]) Signed: 05/19/2019 3:18:29 PM By: [name] RCP, RRT, CHT Entered By: [name] on 05/19/2019 13:15:53

## 2019-05-19 NOTE — Progress Notes (Addendum)
[name], [name] ([id number]) Visit Report for 05/19/2019 HPI Details Patient Name: [name], [name] Date of Service: 05/19/2019 3:00 PM Medical Record Number: [id number] Patient Account Number: [id number] Date of Birth/Sex: February 20, 1951 (68 y.o. 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 He states that there is no history of diabetes that he is aware of. He does have issues with his prostate and is currently receiving treatment for this by way of oral medication. With that being said I do not have a lot of details in that regard. Nonetheless the patient presents today as a result of having been referred to [place] by another provider initially home health was set to come out and take care of his wound although due to the fact that he apparently drives he's not able to receive home health. His wife is therefore trying to help take care of this wound within although they have been struggling with what exactly to do at this point. She states that she can do some things but she is definitely not a nurse and does have some issues with looking at blood. The good news is the wound does not appear to be too deep and is fairly superficial at this point. There is no slough noted there is some nonviable skin noted around the surface of the wound and the perimeter at this point. The central portion of the wound appears to be very good with a dermal layer noted this does not appear to be again deep enough to extend it to subcutaneous tissue at  this point. 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 In the pump he has Dilaudid and baclofen. 12/07/17 on evaluation today patient presents for follow-up concerning his ongoing lower back thigh ulcer on the right. He states that he did not get the supplies ordered and therefore has not really been able to perform the dressing changes as directed exactly. His wife was able to get some Boarder Foam Dressing's from the drugstore and subsequently has been using hydrogel which did help to a degree in the wound does appear to be able smaller. There is actually more drainage this week noted than previous. 12/21/17 on evaluation today patient appears to be doing rather well in regard to his right gluteal ulcer. He has been tolerating the dressing changes without complication. There does not appear to be any evidence of infection at this point in time. Overall the wound does seem to be making some progress as far as the edges are concerned there's not as much in the way of overlapping of the external wound edges and he has a good epithelium to wound bed border for the most part. This however is not true right at the 12 o'clock location over the span of a little over a centimeters which actually will require debridement today to clean this away and hopefully allow it to continue to heal more appropriately. 12/28/17 on evaluation today patient appears to be doing rather well in regard to his ulcer  in the left gluteal region. He's been tolerating the dressing changes without complication. Apparently he has had some difficulty getting his dressing material. Apparently there's been some confusion with ordering we're gonna check into this. Nonetheless overall he's been showing signs of improvement which is good news. Debridement is not required today. 01/04/18 on evaluation today patient presents for follow-up  concerning his right gluteal ulcer. He has been tolerating the dressing changes fairly well. On inspection today it appears he may actually have some maceration them concerned about the fact that he may be developing too much moisture in and around the wound bed which can cause delay in healing. With that being said he unfortunately really has not showed significant signs of improvement since last week's evaluation in fact this may even be just the little bit/slightly larger. Nonetheless he's been having a lot of discomfort I'm not sure this is even related to the wound as he has no pain when I'm to breeding or otherwise cleaning the wound during evaluation today. Nonetheless this is something that we did recommend he talked to his pain specialist concerning. 01/11/18 on evaluation today patient appears to be doing better in regard to his ulceration. He has been tolerating the dressing changes without complication. With that being said overall there's no evidence of infection which is good news. The only thing [name], ROUTSON. ([id number]) is he did receive the hatch affair blue classic versus the ready nonetheless I feel like this is perfectly fine and appears to have done well for him over the past week. 01/25/18 on evaluation today patient's wound actually appears to be a little bit larger than during the last evaluation. The good news is the majority of the wound edges actually appear to be fairly firmly attached to the wound bed unfortunately again we're not really making progress in regard to the size. Roughly the wound is about the same size as when I first saw him although again the wound margin/edges appear to be much better. 02/01/18 on evaluation today patient actually appears to be doing very well in regard to his wound. Applying the Prisma dry does seem to be better although he does still have issues with slow progression of the wound. There was a slight improvement compared to last  week's measurements today. Nonetheless I have been considering other options as far as the possibility of Theraskin or even a snap vac. In general I'm not sure that the Theraskin due to location of the wound would be a very good idea. Nonetheless I do think that a snap vac could be a possibility for the patient and in fact I think this could even be an excellent way to manage the wound possibly seeing some improvement in a very rapid fashion here. Nonetheless this is something that we would need to get approved and I did have a lengthy conversation with the patient about this today. 02/08/18 on evaluation today patient appears to be doing a little better in regard to his ulcer. He has been tolerating the dressing changes without complication. Fortunately despite the fact that the wound is a little bit smaller it's not significantly so unfortunately. We have discussed the possibility of a snap vac we did check with insurance this is actually covered at this point. Fortunately there does not appear to be any sign of infection. Overall I'm fairly pleased with how things seem to be appearing at this point. 02/15/18 on evaluation today patient appears to be doing rather well in regard to  his right gluteal ulcer. Unfortunately the snap vac did not stay in place with his sheer and friction this came loose and did not seem to maintain seal very well. He worked for about two days and it did seem to do very well during that time according to his wife but in general this does not seem to be something that's gonna be beneficial for him long-term. I do believe we need to go back to standard dressings to see if we can find something that will be of benefit. 03/02/18- He is here in follow up evaluation; there is minimal change in the wound. He will continue with the same treatment plan, would consider changing to iodosrob/iodoflex if ulcer continues to to plateau. He will follow up next week 03/08/18 on evaluation  today patient's wound actually appears to be about the same size as when I previously saw him several weeks back. Unfortunately he does have some slightly dark discoloration in the central portion of the wound which has me concerned about pressure injury. I do believe he may be sitting for too long a period of time in fact he tells me that "I probably sit for much too long". He does have some Slough noted on the surface of the wound and again as far as the size of the wound is concerned I'm really not seeing anything that seems to have improved significantly. 03/15/18 on evaluation today patient appears to be doing fairly well in regard to his ulcer. The wound measured pretty much about the same today compared to last week's evaluation when looking at his graph. With that being said the area of bruising/deep tissue injury that was noted last week I do not see at this point. He did get a new cushion fortunately this does seem to be have been of benefit in my pinion. It does appear that he's been off of this more which is good news as well I think that is definitely showing in the overall wound measurements. With that being said I do believe that he needs to continue to offload I don't think that the fact this is doing better should be or is going to allow him to not have to offload and explain this to him as well. Overall he seems to be in agreement the plan I think he understands. The overall appearance of the wound bed is improved compared to last week I think the Iodoflex has been beneficial in that regard. 03/29/18 on evaluation today patient actually appears to be doing rather well in regard to his wound from the overall appearance standpoint he does have some granulation although there's some Slough on the surface of the wound noted as well. With that being said he unfortunately has not improved in regard to the overall measurement of the wound in volume or in size. I did have a discussion with him  very specifically about offloading today. He actually does work although he mainly is just sitting throughout the day. He tells me he offloads by "lifting himself up for 30 seconds off of his chair occasionally" purchase from [employer] which does seem to have helped. And he has a new cushion that he with that being said he's also able to stand some for a very short period of time but not significant enough I think to provide appropriate offloading. I think the biggest issue at this point with the wound and the fact is not healing as quickly as we would like is due to the  fact that he is really not able to appropriately offload while at work. He states the beginning after his injury he actually had a bed at his job that he could lay on in order to offload and that does seem to have been of help back at that time. Nonetheless he had not done this in quite some time unfortunately. I think that could be helpful for him this is something I would like for him to look into. 04/05/18 on evaluation today patient actually presents for follow-up concerning his right gluteal ulcer. Again he really is not [name], GHAFOOR. ([id number]) significantly improved even compared to last week. He has been tolerating the dressing changes without complication. With that being said fortunately there appears to be no evidence of infection at this time. He has been more proactive in trying to offload. 04/12/18 on evaluation today patient actually appears to be doing a little better in regard to his wound and the right gluteal fold region. He's been tolerating the dressing changes since removing the oasis without complication. However he was having a lot of burning initially with the oasis in place. He's unsure of exactly why this was given so much discomfort but he assumes that it was the oasis itself causing the problem. Nonetheless this had to be removed after about three days in place although even those three  days seem to have made a fairly good improvement in regard to the overall appearance of the wound bed. In fact is the first time that he's made any improvement from the standpoint of measurements in about six weeks. He continues to have no discomfort over the area of the wound itself which leads me to wonder why he was having the burning with the oasis when he does not even feel the actual debridement's themselves. I am somewhat perplexed by this. 04/19/18 on evaluation today patient's wound actually appears to be showing signs of epithelialization around the edge of the wound and in general actually appears to be doing better which is good news. He did have the same burning after about three days with applying the Endoform last week in the same fashion that I would generally apply a skin substitute. This seems to indicate that it's not the oasis to cause the problem but potentially the moisture buildup that just causes things to burn or there may be some other reaction with the skin prep or Steri-Strips. Nonetheless I'm not sure that is gonna be able to tolerate any skin substitute for a long period of time. The good news is the wound actually appears to be doing better today compared to last week and does seem to finally be making some progress. 04/26/18 on evaluation today patient actually appears to be doing rather well in regard to his ulcer in the right gluteal fold. He has been tolerating the dressing changes without complication which is good news. The Endoform does seem to be helping although he was a little bit more macerated this week. This seems to be an ongoing issue with fluid control at this point. Nonetheless I think we may be able to add something like Drawtex to help control the drainage. 05/03/18 on evaluation today patient appears to actually be doing better in regard to the overall appearance of his wound. He has been tolerating the dressing changes without complication.  Fortunately there appears to be no evidence of infection at this time. I really feel like his wound has shown signs as of today of turning around last  week I thought so as well and definitely he could be seen in this week's overall appearance and measurements. In general I'm very pleased with the fact that he finally seems to be making a steady but sure progress. The patient likewise is very pleased. 05/17/18 on evaluation today patient appears to be doing more poorly unfortunately in regard to his ulcer. He has been tolerating the dressing changes without complication. With that being said he tells me that in the past couple of days he and his wife have noticed that we did not seem to be doing quite as well is getting dark near the center. Subsequently upon evaluation today the wound actually does appear to be doing worse compared to previous. He has been tolerating the dressing changes otherwise and he states that he is not been sitting up anymore than he was in the past from what he tells me. Still he has continued to work he states "I'm tired of dealing with this and if I have to just go home and lay in the bed all the time that's what I'll do". Nonetheless I am concerned about the fact that this wound does appear to be deeper than what it was previous. 05/24/18 upon evaluation today patient actually presents after having been in the hospital due to what was presumed to be sepsis secondary to the wound infection. He had an elevated white blood cell count between 14 and 15. With that being said he does seem to be doing somewhat better now. His wound still is giving him some trouble nonetheless and he is obviously concerned about the fact likely talked about that this does seem to go more deeply than previously noted. I did review his wound culture which showed evidence of Staphylococcus aureus him and group B strep. Nonetheless he is on antibiotics, Levaquin, for this. Subsequently I did review  his intake summary from the hospital as well. I also did look at the CT of the lumbar spine with contrast that was performed which showed no bone destruction to suggest lumbar disguises/osteomyelitis or sacral osteomyelitis. There was no paraspinal abscess. Nonetheless it appears this may have been more of just a soft tissue infection at this point which is good news. He still is nonetheless concerned about the wound which again I think is completely reasonable considering everything he's been through recently. 05/31/18 on evaluation today on evaluation today patient actually appears to be showing signs of his wound be a little bit deeper than what I would like to see. Fortunately he does not show any signs of significant infection although his temperature was 99 today he states he's been checking this at home and has not been elevated. Nonetheless with the undermining that I'm seeing at this point I am becoming more concerned about the wound I do think that offloading is a key factor here that is preventing the speedy recovery at this point. There does not appear to be any evidence of again over infection noted. He's been using Santyl currently. 06/07/18 the patient presents today for follow-up evaluation regarding the left ulcer in the gluteal region. He has been [name], STODGHILL. ([id number]) tolerating the Wound VAC fairly well. He is obviously very frustrated with this he states that to mean is really getting in his way. There does not appear to be any evidence of infection at this time he does have a little bit of odor I do not necessarily associate this with infection just something that we sometimes notice with Wound  VAC therapy. With that being said I can definitely catch a tone of discontentment overall in the patient's demeanor today. This when he was previously in the hospital an CT scan was done of the lumbar region which did not reveal any signs of osteomyelitis. With that being said  the pelvis in particular was not evaluated distinctly which means he could still have some osteonecrosis I. Nonetheless the Wound VAC was started on Thursday I do want to get this little bit more time before jumping to a CT scan of the pelvis although that is something that I might would recommend if were not see an improvement by that time. 06/14/18 on evaluation today patient actually appears to be doing about the same in regard to his right gluteal ulcer. Again he did have a CT scan of the lumbar spine unfortunately this did not include the pelvis. Nonetheless with the depth of the wound that I'm seeing today even despite the fact that I'm not seeing any evidence of overt cellulitis I believe there's a good chance that we may be dealing with osteomyelitis somewhere in the right Ischial region. No fevers, chills, nausea, or vomiting noted at this time. 06/21/18 on evaluation today patient actually appears to be doing about the same with regard to his wound. The tunnel at 6 o'clock really does not appear to be any deeper although it is a little bit wider. I think at this point you may want to start packing this with white phone. Unfortunately I have not got approval for the CT scan of the pelvis as of yet due to the fact that Medicare apparently has been denied it due to the diagnosis codes not being appropriate according to Medicare for the test requested. With that being said the patient cannot have an MRI and therefore this is the only option that we have as far as testing is concerned. The patient has had infection and was on antibiotics and been added code for cellulitis of the bottom to see if this will be appropriate for getting the test approved. Nonetheless I'm concerned about the infection have been spread deeper into the Ischial region. 06/28/18 on evaluation today patient actually appears to be doing rather well all things considered in regard to the right gluteal ulcer. He has been  tolerating the dressing changes without complication. With that being said the Wound VAC he states does have to be replaced almost every day or at least reinforced unfortunately. Patient actually has his CT scan later this morning we should have the results by tomorrow. 07/05/18 on evaluation today patient presents for follow-up concerning his right Ischial ulcer. He did see the surgeon Dr. Lysle Pearl last week. They were actually very happy with him and felt like he spent a tremendous amount of time with them as far as discussing his situation was concerned. In the end Dr. Lysle Pearl did contact me as well and determine that he would not recommend any surgical intervention at this point as he felt like it would not be in the patient's best interest based on what he was seeing. He recommended a referral to infectious disease. Subsequently this is something that Dr. Ines Bloomer office is working on setting up for the patient. As far as evaluation today is concerned the patient's wound actually appears to be worse at this point. I am concerned about how things are progressing and specifically about infection. I do not feel like it's the deeper but the area of depth is definitely widening which does  have me concerned. No fevers, chills, nausea, or vomiting noted at this time. I think that we do need initiate antibiotic therapy the patient has an allow allergy to amoxicillin/penicillin he states that he gets a rash since childhood. Nonetheless she's never had the issues with Catholics or cephalosporins in general but he is aware of. 07/27/18 on evaluation today patient presents following admission to the hospital on 07/09/18. He was subsequently discharged on 07/20/18. On 07/15/18 the patient underwent irrigation and debridement was soft tissue biopsy and bone biopsy as well as placement of a Wound VAC in the OR by Dr. Celine Ahr. During the hospital course the patient was placed on a Wound VAC and recommended follow up  with surgery in three weeks actually with Dr. Delaine Lame who is infectious disease. The patient was on vancomycin during the hospital course. 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 He also had a bone culture which revealed evidence of methicillin-resistant staph aureus. He is updated CT scan 07/09/18 reveals that he had progression of the which was performed on wound to breakdown down to the trochanter where he actually had irregularities there as well suggestive of osteomyelitis. This was a change just since 9 December when we last performed a CT scan. Obviously this one had gone downhill quite significantly and rapidly. At this point upon evaluation I feel like in general the patient's wound seems to be doing fairly well all things considered upon my evaluation today. Obviously this is larger and deeper than what I previously evaluated but at the same time he seems to be making some progress as far as the appearance of the granulation tissue is concerned. I'm happy in that regard. No fevers, chills, nausea, or vomiting noted at this time. He is on IV vancomycin and Rocephin at the facility. He is currently in [employer]. 08/03/18 upon evaluation today patient's wound appears to be doing better in regard to the overall appearance at this point in time. Fortunately he's been tolerating the Wound VAC without complication and states that the facility has been taking excellent care of the wound site. Overall I see some Slough noted on the surface which I am going to attempt sharp SHRIYANS, DILULLO. ([id number]) debridement today of but nonetheless other than this I feel like he's making progress. 08/09/18 on evaluation today patient's wound appears to be doing much better compared to even last week's evaluation. Do believe that the Wound VAC is been of great benefit for him. He has been tolerating the dressing changes that is the Wound VAC without any  complication and he has excellent granulation noted currently. There is no need for sharp debridement at this point. 08/16/18 on evaluation today patient actually appears to be doing very well in regard to the wound in the right gluteal fold region. This is showing signs of progress and again appears to be very healthy which is excellent news. Fortunately there is no sign of active infection by way of odor or drainage at this point. Overall I'm very pleased with how things stand. He seems to be tolerating the Wound VAC without complication. 08/23/18 on evaluation today patient actually appears to be doing better in regard to his wound. He has been tolerating the Wound VAC without complication and in fact it has been collecting a significant amount of drainage which I think is good news especially considering how the wound appears. Fortunately there is no signs of infection at this time definitely nothing appears to  be worse which is good news. He has not been started on the Bactrim and Flagyl that was recommended by Dr. Delaine Lame yet. I did actually contact her office this morning in order to check and see were things are that regard their gonna be calling me back. 08/30/18 on evaluation today patient actually appears to show signs of excellent improvement today compared to last evaluation. The undermining is getting much better the wound seems to be feeling quite nicely and I'm very pleased that the granulation in general. With that being said overall I feel like the patient has made excellent progress which is great news. No fevers, chills, nausea, or vomiting noted at this time. 09/06/18 on evaluation today patient actually appears to be doing rather well in regard to his right gluteal ulcer. This is showing signs of improvement in overall I'm very pleased with how things seem to be progressing. The patient likewise is please. Overall I see no evidence of infection he is about to complete his oral  antibiotic regimen which is the end of the antibiotics for him in just about three days. 09/13/18 on evaluation today patient's right Ischial ulcer appears to be showing signs of continued improvement which is excellent news. He's been tolerating the dressing changes without complication. Fortunately there's no signs of infection and the wound that seems to be doing very well. 09/28/18 on evaluation today patient appears to be doing rather well in regard to his right Ischial ulcer. He's been tolerating the Wound VAC without complication he knows there's much less drainage than there used to be this obviously is not a bad thing in my pinion. There's no evidence of infection despite the fact is but nothing about it now for several weeks. 10/04/18 on evaluation today patient appears to be doing better in regard to his right Ischial wound. He has been tolerating the Wound VAC without complication and I do believe that the silver nitrate last week was beneficial for him. Fortunately overall there's no evidence of active infection at this time which is great news. No fevers, chills, nausea, or vomiting noted at this time. 10/11/18 on evaluation today patient actually appears to be doing rather well in regard to his Ischial ulcer. He's been tolerating the Wound VAC still without complication I feel like this is doing a good job. No fevers, chills, nausea, or vomiting noted at this time. 11/01/18 on evaluation today patient presents after having not been seen in our clinic for several weeks secondary to the fact that he was on evaluation today patient presents after having not been seen in our clinic for several weeks secondary to the fact that he was in a skilled nursing facility which was on lockdown currently due to the covert 19 national emergency. Subsequently he was discharged from the facility on this past Friday and subsequently made an appointment to come in to see yesterday. Fortunately there's no  signs of active infection at this time which is good news and overall he does seem to have made progress since I last saw. Overall I feel like things are progressing quite nicely. The patient is having no pain. 11/08/18 on evaluation today patient appears to be doing okay in regard to his right gluteal ulcer. He has been utilizing a Wound VAC home health this changing this at this point since he's home from the skilled nursing facility. Fortunately there's no signs of obvious active infection at this time. Unfortunately though there's no obvious active infection he is having some  maceration and his wife states that when the sheets of the Wound VAC office on Sunday when it broke seal that he ended up having significant issues with some smell as well there concerned about the possibility of infection. Fortunately there's No fevers, chills, nausea, or vomiting noted at this time. [name], [name] ([id number]) 11/15/18 on evaluation today patient actually appears to be doing well in regard to his right gluteal ulcer. He has been tolerating the dressing changes without complication. Specifically the Wound VAC has been utilized up to this point. Fortunately there's no signs of infection and overall I feel like he has made progress even since last week when I last saw him. I'm actually fairly happy with the overall appearance although he does seem to have somewhat of a hyper granular overgrowth in the central portion of the wound which I think may require some sharp debridement to try flatness out possibly utilizing chemical cauterization following. 11/23/18 on evaluation today patient actually appears to be doing very well in regard to his sacral ulcer. He seems to be showing signs of improvement with good granulation. With that being said he still has the small area of hyper granulation right in the central portion of the wound which I'm gonna likely utilize silver nitrate on today. Subsequently he also  keeps having a leak at the 6 o'clock location which is unfortunate we may be able to help out with some suggestions to try to prevent this going forward. Fortunately there's no signs of active infection at this time. 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 He's been tolerating the dressing changes without complication. Fortunately there's no signs of active infection at this time. I've been rather pleased with how things have progressed there still some evidence of pressure getting to the area with some redness right around the immediate wound opening. Nonetheless other than this I'm not seeing any significant complications or issues the wound is somewhat hyper granular. Upon discussing with the patient and his wife today I'm not sure that the wound is being packed to the base with the foam at this point. And if it's not been packed fully that may be part of the reason why is not seen as much improvement as far as the granulation from the base out. Again we do not want pack too tightly but we need some of the firm to get to the base of the wound. I discussed this with patient and his wife today. 12/06/18 on evaluation today patient appears to be doing well in regard to his right gluteal pressure ulcer. He's been tolerating the dressing changes without complication. Fortunately there's no signs of active infection. He still has some hyper granular tissue and I do think it would be appropriate to continue with the chemical cauterization as of today. 12/16/18 on evaluation today patient actually appears to be doing okay in regard to his right gluteal ulcer. He is been tolerating the dressing changes without complication including the Wound VAC. Overall I feel like nothing seems to be worsening I do feel like that the hyper granulation buds in the central portion of the wound have improved to some degree  with the silver nitrate. We will have to see how things continue to progress. 12/20/18 on evaluation today patient actually appears to be doing much worse in my pinion even compared to last week's evaluation. Unfortunately as opposed to showing any  signs of improvement the areas of hyper granular tissue in the central portion of the wound seem to be getting worse. Subsequently the wound bed itself also seems to be getting deeper even compared to last week which is both unusual as well as concerning since prior he had been shown signs of improvement. Nonetheless I think that the issue could be that he's actually having some difficulty in issues with a deeper infection. There's no external signs of infection but nonetheless I am more worried about the internal, osteomyelitis, that could be restarting. He has not been on antibiotics for some time at this point. I think that it may be a good idea to go ahead and started back on an antibiotic therapy while we wait to see what the testing shows. 12/27/18 on evaluation today patient presents for follow-up concerning his left gluteal fold wound. Fortunately he appears to be doing well today. I did review the CT scan which was negative for any signs of osteomyelitis or acute abnormality this is excellent news. Overall I feel like the surface of the wound bed appears to be doing significantly better today compared to previously noted findings. There does not appear any signs of infection nor does he have any pain at this time. 01/03/19 on evaluation today patient actually appears to be doing quite well in regard to his ulcer. Post debridement last week he really did not have too much bleeding which is good news. Fortunately today this seems to be doing some better but we still has some of the hyper granular tissue noted in the base of the wound which is gonna require sharp debridement today as well. Overall I'm pleased with how things seem to be progressing since  we switched away from the Wound VAC I think he is making some progress. 01/10/19 on evaluation today patient appears to be doing better in regard to his right gluteal fold ulcer. He has been tolerating the dressing changes without complication. The debridement to seem to be helping with current away some of the poor hyper granular tissue bugs throughout the region of his gluteal fold wound. He's been tolerating the dressing changes otherwise without complication which is great news. No fevers, chills, nausea, or vomiting noted at this time. 01/17/19 on evaluation today patient actually appears to be doing excellent in regard to his wound. He's been tolerating the dressing changes without complication. Fortunately there is no signs of active infection at this time which is great news. No fevers, chills, nausea, or vomiting noted at this time. [name], [name] ([id number]) 01/24/19 on evaluation today patient actually appears to be doing quite well with regard to his ulcer. He has been tolerating the dressing changes without complication. Fortunately there's no signs of active infection at this time. Overall been very pleased with the progress that he seems to be making currently. 01/31/19-Patient returns at 1 week with apparent similarity in dimensions to the wound, with no signs of infection, he has been changing dressings twice a day 02/08/19 upon evaluation today patient actually appears to be doing well with regard to his right Ischial ulcer. The wound is not appear to be quite as deep and seems to be making progress which is good news. With that being said I'm still reluctant to go back to the Wound VAC at this point. He's been having to change the dressings twice a day which is a little bit much in my pinion from the wound care supplies standpoint. I think that possibly attempting  to utilize extras orbit may be beneficial this may also help to prevent any additional breakdown secondary to fluid  retention in the wound itself. The patient is in agreement with giving this a try. 02/15/19 on evaluation today patient actually appears to be doing decently well with regard to his ulcer in the right to gluteal fold location. He's been tolerating the dressing changes without complication. Fortunately there is no signs of active infection at this time. He is able to keep the current dressing in place more effectively for a day at a time whereas before he was having a changes to to three times a day. The actions or has been helpful in this regard. Fortunately there's no signs of anything getting worse and I do feel like he showing signs of good improvement with regard to the wound bed status. 02/22/2019 patient appears to be doing very well today with regard to his ulcer in the gluteal fold. Fortunately there is no signs of active infection and he has been tolerating the dressing changes without any complication. Overall extremely pleased with how things seem to be progressing. He has much less of the hyper granular projections within the wound these have slowly been debrided away and he seems to be doing well. The wound bed is more uniform. 03/01/19 on evaluation today patient appears to be doing unfortunately about the same in regard to his gluteal ulcer. He's been tolerating the dressing changes without complication. Fortunately there's no signs of active infection at this time. With that being said he continues to develop these hyper granular projections which I'm unsure of exactly what they are and why they are rising. Nonetheless I explained to the patient that I do believe it would be a good idea for [place] to stand a biopsy sample for pathology to see if that can shed any light on what exactly may be going on here. Fortunately I do not see any obvious signs of infection. With that being said the patient has had a little bit more drainage this week apparently compared to last week. 03/08/2019 on  evaluation today patient actually appears to look somewhat better with regard to the appearance of his wound bed at this time. This is good news. Overall I am very pleased with how things seem to have progressed just in the past week with a switch to the [facility] dressing. I think that has been beneficial for him. With that being said at this time the patient is concerned about his biopsy that I sent off last week unfortunately I do not have that report as of yet. Nonetheless we have called to obtain this and hopefully will hear back from the lab later this morning. 03/15/19 on evaluation today patient's wound actually appears to be doing okay today with regard to the overall appearance of the wound bed. He has been tolerating the dressing changes without complication he still has hyper granular tissue noted but fortunately that seems to be minimal at this point compared to some of what we've seen in the past. Nonetheless I do think that he is still having some issues currently with some of this type of granulation the biopsy and since all showed nothing more than just evidence of granulation tissue. Therefore there really is nothing different to initiate or do at this point. 03/24/19 on evaluation today patient appears to be doing a little better with regard to his ulcer. He's been tolerating the dressing changes without complication. Fortunately there is no signs of  active infection at this time. No fevers, chills, nausea, or vomiting noted at this time. I'm overall pleased with how things seem to be progressing. 03/29/2019 on evaluation today patient appears to be doing about the same in regard to his ulcer in the right gluteal fold. Unfortunately he is not seeming to make a lot of progress and the wound is somewhat stalled. There is no signs of active infection externally but I am concerned about the possibility of infection continuing in the ischial location which previously he did have  infection noted. Again is not able to have an MRI so probably our best option for testing for this would be a triple phase bone scan which will detect subtle changes in the bone more so than plain film x-rays. In the past they really have not been beneficial and in fact the CT scans even have been somewhat questionable at times. Nonetheless there is no signs of systemic infection which is at least good news but again his wound is not healing at all on the predicted schedule. 04/05/19 on evaluation today patient appears to be doing well all things considering with regard to his wound and the right gluteal fold. He actually has his triple bone scan scheduled for sometime in the next couple of days. With that being said I've also been looking to other possibilities of what could be causing hyper granular tissue were looking into the bone scan again for evaluating for the risk or possibility of infection deeper and I'm also gonna go ahead and see about obtaining a deep tissue culture today to send and see if there's any evidence of infection noted on culture. He's in agreement with that plan. [name], [name] ([id number]) 04/12/2019 on evaluation today patient presents for reevaluation here in our clinic concerning ongoing issues with his right gluteal fold ulcer. 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 It was discussed with him at that point that I think it would be appropriate for him to proceed with hyperbaric oxygen therapy especially in light of the fact that he is previously been on IV antibiotics at the beginning of the year for close to 3 months followed by several weeks of oral antibiotics that was all prescribed by infectious disease. He had surgical debridement around Christmas December 2019 due to an abscess and osteomyelitis of the ischial bone. Unfortunately this has  not really proceeded as well as we would have liked and again we did a CT scan even a couple months ago as he cannot have an MRI secondary to having issues with both a pain pump as well as a spinal cord stimulator which prevent him from going into an MRI machine. With that being said there were chronic changes noted at the ischial tuberosity which had progressed since December 2019 there was no evidence of fluid collection on that initial CT scan. With that being said that was on January 21, 2019. I am not sure that it was a sensitive enough test as compared to an MRI and then subsequently I ordered a bone scan for him which was actually completed on 03/29/2019 and this revealed that he does indeed have positive osteomyelitis involving the right ischial tuberosity. This is adjacent to the ulcer and I think is the reason that his ulcer is not healing. Subsequently I am in a place him back on oral antibiotics today unfortunately his wound  culture showed just mixed gram-negative flora with no specific findings of a predominant organism. Nonetheless being with the location I think a good broad-spectrum antibiotic for gram-negative's is a good choice at this point he is previously taken Levaquin without significant issues and I think that is an appropriate antibiotic for him at this time. 04/19/2019 on evaluation today patient actually appears to be doing fairly well with regard to his wound. He has been tolerating the dressing changes without complication. Fortunately there is no signs of active infection and he has been taking the oral antibiotics at this time. Subsequently we did make a referral to infectious disease although Dr. Steva Ready wants the patient to be seen by general surgery first in order apparently to see if there is anything from a surgical standpoint that should be done prior to initiation of IV antibiotic therapy. Again the patient is okay with actually seeing Dr. Celine Ahr whom he has seen  before we will make that referral. 04/26/2019 on evaluation today patient actually appears to be doing well with regard to his wound. He has been tolerating the dressing changes without complication. Fortunately there is no signs of active infection at this time. I do believe that the hyperbaric oxygen therapy along with the antibiotics which I prescribed at this point have been doing well for him. With that being said he has not seen Dr. Celine Ahr the surgeon yet in fact they have not been contacted for scheduling an appointment as of yet. Subsequently the patient is not on antibiotics currently by IV Dr. Steva Ready did want him to see Dr. Celine Ahr first which we are working on trying to get scheduled. We again give the information to the patient today for Dr. Celine Ahr and her number as far as contacting their office to see about getting something scheduled. Again were looking for whether or not they recommend any surgical intervention. 05/03/2019 on evaluation today patient appears to be doing about the same with regard to his wound there may be a little bit of filling in in regard to the base of the wound but still he has quite a bit of hyper granular tissue that I am seeing today. I believe that he may may need a more aggressive sharp debridement possibly even by the surgeon or under anesthesia in order to clear away some of the hyper granular material in order to help allow for appropriate granulation to fill in. We have made a referral to Dr. Celine Ahr unfortunately it sounds as if they did not receive the referral we contacted them today they should be get in touch with the patient. Depending on how things show from that standpoint the patient may need to see Dr. Ardyth Gal who is the infectious disease specialist although he really does not want a PICC line again. No fevers, chills, nausea, vomiting, or diarrhea. He is tolerating hyperbaric oxygen therapy very well. 05/12/2019 on evaluation today  patient presents for follow-up visit concerning his right gluteal fold ulcer. He did see Dr. Celine Ahr the surgeon who previously evaluated his wound and she actually felt like he was doing quite well with regard to the wound based on what she was seen. He does seem to be responding to some degree with the oral antibiotics along with hyperbaric oxygen therapy at this point she did not see any evidence or need for further surgical intervention at this point. She recommended deferring additional or ongoing antibiotic therapy to Dr. Steva Ready at her discretion. Fortunately there is no signs of active systemic  infection. No fevers, chills, nausea, vomiting, or diarrhea. 10/29; patient I am seeing really for the first time today in terms of his wound. 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 He is being treated with hyperbaric oxygen for underlying osteomyelitis most recently documented by a three-phase bone scan. He has been on Levaquin for roughly a month and he is out of these and asked me for a refill. Most recent cultures were negative although I do not think these were bone cultures. He has a very irregular surface to this wound which is almost nodular in texture. It undermines superiorly with the same nodular hyper granulated surface. I see that he was referred to general surgery for an operative debridement although the surgeon did not agree with this I think that would have been the proper course of action in this. He has been using Hydrofera Blue. He is supposed to start a wound VAC tomorrow which is actually a reapplication of wound VAC. I think mostly last time they had trouble keeping the seal in place I hope we have better look at this this time. CEPHAS, VALDOVINOS ([id number]) Electronic Signature(s) Signed: 05/19/2019 5:39:42 PM By: Linton Ham MD Entered By: Linton Ham on 05/19/2019 17:11:29 [name], [name]  ([id number]) -------------------------------------------------------------------------------- Physical Exam Details Patient Name: [name] Date of Service: 05/19/2019 3:00 PM Medical Record Number: [id number] Patient Account Number: [id number] Date of Birth/Sex: Aug 22, 1950 (68 y.o. M) Treating RN: Army Melia Primary Care Provider: [name] Other Clinician: [name] Referring Provider: [name] Treating Provider/Extender: [name] in Treatment: 19 Notes Wound exam; this is a deep wound without palpable bone however it probes superiorly. The base of the wound is hyper granulated with almost polypoid areas in some locations. It does not appear to be infected and once again there is no palpable bone. No surrounding erythema no crepitus Electronic Signature(s) Signed: 05/19/2019 5:39:42 PM By: Linton Ham MD Entered By: Linton Ham on 05/19/2019 17:14:43 [name], [name] ([id number]) -------------------------------------------------------------------------------- Physician Orders Details Patient Name: [name] Date of Service: 05/19/2019 3:00 PM Medical Record Number: [id number] Patient Account Number: [id number] Date of Birth/Sex: Oct 26, 1950 (68 y.o. M) Treating RN: Army Melia Primary Care Provider: [name] Other Clinician: [name] Referring Provider: [name] Treating Provider/Extender: [name] in Treatment: 97 Verbal / Phone Orders: No Diagnosis Coding Wound Cleansing Wound #1 Right Gluteal fold o Clean wound with Normal Saline. - in office o Cleanse wound with mild soap and water Skin Barriers/Peri-Wound Care o Skin Prep Primary Wound Dressing Wound #1 Right Gluteal fold o Silver Collagen Dressing Change Frequency Wound #1 Right Gluteal fold o Change Dressing Monday, Wednesday, Friday Follow-up Appointments Wound #1 Right Gluteal fold o Return  Appointment in 1 week. Off-Loading Wound #1 Right Gluteal fold o Turn and reposition every 2 hours Home Health Wound #1 Right Gluteal fold o Crane Nurse may visit PRN to address patientos wound care needs. o FACE TO FACE ENCOUNTER: MEDICARE and MEDICAID PATIENTS: I certify that this patient is under my care and that I had a face-to-face encounter that meets the physician face-to-face encounter requirements with this patient on this date. The encounter with the patient was in whole or in part for the following MEDICAL CONDITION: (primary reason for [place]) MEDICAL NECESSITY: I certify, that based on my findings, NURSING services are a medically necessary home health service. HOME BOUND STATUS: I certify that  my clinical findings support that this patient is homebound (i.e., Due to illness or injury, pt requires aid of supportive devices such as crutches, cane, wheelchairs, walkers, the use of special transportation or the assistance of another person to leave their place of residence. There is a normal inability to leave the home and doing so requires considerable and taxing effort. Other absences are for medical reasons / religious services and are infrequent or of short duration when for other reasons). o If current dressing causes regression in wound condition, may D/C ordered dressing product/s and apply Normal Saline Moist Dressing daily until next [place] / Other MD appointment. [place] of regression in wound condition at [phone]. o Please direct any NON-WOUND related issues/requests for orders to patient's Primary Care Physician ASHAY, MALIA ([id number]) Negative Pressure Wound Therapy Wound #1 Right Gluteal fold o Wound VAC settings at 125/130 mmHg continuous pressure. Use BLACK/GREEN foam to wound cavity. Use WHITE foam to fill any tunnel/s and/or undermining. Change VAC dressing  3 X WEEK. Change canister as indicated when full. Nurse may titrate settings and frequency of dressing changes as clinically indicated. o Home Health Nurse may d/c VAC for s/s of increased infection, significant wound regression, or uncontrolled drainage. [place] at [phone]. o Apply contact layer over base of wound. - apply prisma/silver collagen to the base of the wound o Number of foam/gauze pieces used in the dressing = o Place NPWT on HOLD. Patient Medications Allergies: penicillin, doxycycline, clindamycin Notifications Medication Indication Start End Levaquin osteomyelitis 05/19/2019 DOSE oral 500 mg tablet - 1 tablet oral daily for 2 weeks. (continuing rx) Electronic Signature(s) Signed: 05/24/2019 8:56:47 AM By: Army Melia Signed: 05/27/2019 7:47:21 AM By: Linton Ham MD Previous Signature: 05/19/2019 4:13:11 PM Version By: Linton Ham MD Previous Signature: 05/19/2019 4:10:10 PM Version By: Army Melia Entered By: Army Melia on 05/24/2019 08:56:47 HERBER, RICARDEZ ([id number]) -------------------------------------------------------------------------------- Problem List Details Patient Name: [name] Date of Service: 05/19/2019 3:00 PM Medical Record Number: [id number] Patient Account Number: [id number] Date of Birth/Sex: 09-22-1950 (68 y.o. M) Treating RN: Army Melia Primary Care Provider: [name] Other Clinician: [name] Referring Provider: [name] Treating Provider/Extender: [name] in Treatment: 62 Active Problems ICD-10 Evaluated Encounter Code Description Active Date Today Diagnosis L89.314 Pressure ulcer of right buttock, stage 4 11/30/2017 No Yes M86.68 Other chronic osteomyelitis, other site 04/08/2019 No Yes L03.317 Cellulitis of buttock 06/21/2018 No Yes G82.20 Paraplegia, unspecified 11/30/2017 No Yes S34.109S Unspecified injury to unspecified level of lumbar  spinal cord, 11/30/2017 No Yes sequela I10 Essential (primary) hypertension 11/30/2017 No Yes Inactive Problems Resolved Problems Electronic Signature(s) Signed: 05/19/2019 5:39:42 PM By: Linton Ham MD Entered By: Linton Ham on 05/19/2019 17:08:09 [name] ([id number]) -------------------------------------------------------------------------------- Progress Note Details Patient Name: [name] Date of Service: 05/19/2019 3:00 PM Medical Record Number: [id number] Patient Account Number: [id number] Date of Birth/Sex: 09-07-1950 (68 y.o. 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 He states that there is no history of diabetes that he is aware of. He  does have issues with his prostate and is currently receiving treatment for this by way of oral medication. With that being said I do not have a lot of details in that regard. Nonetheless the patient presents today as a result of having been referred to [place] by another provider initially home health was set to come out and take care of his wound although due to the fact that he apparently drives he's not able to receive home health. His wife is therefore trying to help take care of this wound within although they have been struggling with what exactly to do at this point. She states that she can do some things but she is definitely not a nurse and does have some issues with looking at blood. The good news is the wound does not appear to be too deep and is  fairly superficial at this point. There is no slough noted there is some nonviable skin noted around the surface of the wound and the perimeter at this point. The central portion of the wound appears to be very good with a dermal layer noted this does not appear to be again deep enough to extend it to subcutaneous tissue at this point. 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 In the pump he has Dilaudid and baclofen. 12/07/17 on evaluation today patient presents for follow-up concerning his ongoing lower back thigh ulcer on the right. He states that he did not get the supplies ordered and therefore has not really been able to perform the dressing changes as directed exactly. His wife was able to get some Boarder Foam Dressing's from the drugstore and subsequently has been using hydrogel which did help to a degree in the wound does appear to be able smaller. There is actually more drainage this week noted than previous. 12/21/17 on evaluation today patient appears to be doing rather well in regard to his right gluteal ulcer. He has been tolerating the dressing changes without complication. There does not appear to be any evidence of infection at this point in time. Overall the wound does seem to be making some progress as far as the edges are concerned there's not as much in the way of overlapping of the external wound edges and he has a good epithelium to wound bed border for the most part. This however is not true right at the 12 o'clock location over the span of a little over a centimeters which actually will require debridement today to clean this away and hopefully allow it to continue to heal more appropriately. 12/28/17 on evaluation today patient appears to be doing rather well in regard to his ulcer in the left gluteal region. He's been tolerating the dressing changes without complication.  Apparently he has had some difficulty getting his dressing material. Apparently there's been some confusion with ordering we're gonna check into this. Nonetheless overall he's been showing signs of improvement which is good news. Debridement is not required today. 01/04/18 on evaluation today patient presents for follow-up concerning his right gluteal ulcer. He has been tolerating the dressing changes fairly well. On inspection today it appears he may actually have some maceration them concerned about the fact that he may be developing too much moisture in and around the wound bed which can cause delay in healing. With that being said he unfortunately really has not showed significant signs of improvement since last week's  evaluation in fact this may even be just the little bit/slightly larger. Nonetheless he's been having a lot of discomfort I'm not sure this is even related to the wound as he has no pain when I'm to breeding or otherwise cleaning the wound during evaluation today. Nonetheless this is something that we did recommend he talked to his pain specialist concerning. 01/11/18 on evaluation today patient appears to be doing better in regard to his ulceration. He has been tolerating the dressing changes without complication. With that being said overall there's no evidence of infection which is good news. The only thing is he did receive the hatch affair blue classic versus the ready nonetheless I feel like this is perfectly fine and appears to have done well for him over the past week. 01/25/18 on evaluation today patient's wound actually appears to be a little bit larger than during the last evaluation. The good [name], [name]. ([id number]) news is the majority of the wound edges actually appear to be fairly firmly attached to the wound bed unfortunately again we're not really making progress in regard to the size. Roughly the wound is about the same size as when I first saw him although  again the wound margin/edges appear to be much better. 02/01/18 on evaluation today patient actually appears to be doing very well in regard to his wound. Applying the Prisma dry does seem to be better although he does still have issues with slow progression of the wound. There was a slight improvement compared to last week's measurements today. Nonetheless I have been considering other options as far as the possibility of Theraskin or even a snap vac. In general I'm not sure that the Theraskin due to location of the wound would be a very good idea. Nonetheless I do think that a snap vac could be a possibility for the patient and in fact I think this could even be an excellent way to manage the wound possibly seeing some improvement in a very rapid fashion here. Nonetheless this is something that we would need to get approved and I did have a lengthy conversation with the patient about this today. 02/08/18 on evaluation today patient appears to be doing a little better in regard to his ulcer. He has been tolerating the dressing changes without complication. Fortunately despite the fact that the wound is a little bit smaller it's not significantly so unfortunately. We have discussed the possibility of a snap vac we did check with insurance this is actually covered at this point. Fortunately there does not appear to be any sign of infection. Overall I'm fairly pleased with how things seem to be appearing at this point. 02/15/18 on evaluation today patient appears to be doing rather well in regard to his right gluteal ulcer. Unfortunately the snap vac did not stay in place with his sheer and friction this came loose and did not seem to maintain seal very well. He worked for about two days and it did seem to do very well during that time according to his wife but in general this does not seem to be something that's gonna be beneficial for him long-term. I do believe we need to go back to standard dressings  to see if we can find something that will be of benefit. 03/02/18- He is here in follow up evaluation; there is minimal change in the wound. He will continue with the same treatment plan, would consider changing to iodosrob/iodoflex if ulcer continues to to plateau. He will  follow up next week 03/08/18 on evaluation today patient's wound actually appears to be about the same size as when I previously saw him several weeks back. Unfortunately he does have some slightly dark discoloration in the central portion of the wound which has me concerned about pressure injury. I do believe he may be sitting for too long a period of time in fact he tells me that "I probably sit for much too long". He does have some Slough noted on the surface of the wound and again as far as the size of the wound is concerned I'm really not seeing anything that seems to have improved significantly. 03/15/18 on evaluation today patient appears to be doing fairly well in regard to his ulcer. The wound measured pretty much about the same today compared to last week's evaluation when looking at his graph. With that being said the area of bruising/deep tissue injury that was noted last week I do not see at this point. He did get a new cushion fortunately this does seem to be have been of benefit in my pinion. It does appear that he's been off of this more which is good news as well I think that is definitely showing in the overall wound measurements. With that being said I do believe that he needs to continue to offload I don't think that the fact this is doing better should be or is going to allow him to not have to offload and explain this to him as well. Overall he seems to be in agreement the plan I think he understands. The overall appearance of the wound bed is improved compared to last week I think the Iodoflex has been beneficial in that regard. 03/29/18 on evaluation today patient actually appears to be doing rather well in  regard to his wound from the overall appearance standpoint he does have some granulation although there's some Slough on the surface of the wound noted as well. With that being said he unfortunately has not improved in regard to the overall measurement of the wound in volume or in size. I did have a discussion with him very specifically about offloading today. He actually does work although he mainly is just sitting throughout the day. He tells me he offloads by "lifting himself up for 30 seconds off of his chair occasionally" purchase from [employer] which does seem to have helped. And he has a new cushion that he with that being said he's also able to stand some for a very short period of time but not significant enough I think to provide appropriate offloading. I think the biggest issue at this point with the wound and the fact is not healing as quickly as we would like is due to the fact that he is really not able to appropriately offload while at work. He states the beginning after his injury he actually had a bed at his job that he could lay on in order to offload and that does seem to have been of help back at that time. Nonetheless he had not done this in quite some time unfortunately. I think that could be helpful for him this is something I would like for him to look into. 04/05/18 on evaluation today patient actually presents for follow-up concerning his right gluteal ulcer. Again he really is not significantly improved even compared to last week. He has been tolerating the dressing changes without complication. With that being said fortunately there appears to be no evidence of infection  at this time. He has been more proactive in trying to offload. [name], [name] ([id number]) 04/12/18 on evaluation today patient actually appears to be doing a little better in regard to his wound and the right gluteal fold region. He's been tolerating the dressing changes since removing the  oasis without complication. However he was having a lot of burning initially with the oasis in place. He's unsure of exactly why this was given so much discomfort but he assumes that it was the oasis itself causing the problem. Nonetheless this had to be removed after about three days in place although even those three days seem to have made a fairly good improvement in regard to the overall appearance of the wound bed. In fact is the first time that he's made any improvement from the standpoint of measurements in about six weeks. He continues to have no discomfort over the area of the wound itself which leads me to wonder why he was having the burning with the oasis when he does not even feel the actual debridement's themselves. I am somewhat perplexed by this. 04/19/18 on evaluation today patient's wound actually appears to be showing signs of epithelialization around the edge of the wound and in general actually appears to be doing better which is good news. He did have the same burning after about three days with applying the Endoform last week in the same fashion that I would generally apply a skin substitute. This seems to indicate that it's not the oasis to cause the problem but potentially the moisture buildup that just causes things to burn or there may be some other reaction with the skin prep or Steri-Strips. Nonetheless I'm not sure that is gonna be able to tolerate any skin substitute for a long period of time. The good news is the wound actually appears to be doing better today compared to last week and does seem to finally be making some progress. 04/26/18 on evaluation today patient actually appears to be doing rather well in regard to his ulcer in the right gluteal fold. He has been tolerating the dressing changes without complication which is good news. The Endoform does seem to be helping although he was a little bit more macerated this week. This seems to be an ongoing issue with  fluid control at this point. Nonetheless I think we may be able to add something like Drawtex to help control the drainage. 05/03/18 on evaluation today patient appears to actually be doing better in regard to the overall appearance of his wound. He has been tolerating the dressing changes without complication. Fortunately there appears to be no evidence of infection at this time. I really feel like his wound has shown signs as of today of turning around last week I thought so as well and definitely he could be seen in this week's overall appearance and measurements. In general I'm very pleased with the fact that he finally seems to be making a steady but sure progress. The patient likewise is very pleased. 05/17/18 on evaluation today patient appears to be doing more poorly unfortunately in regard to his ulcer. He has been tolerating the dressing changes without complication. With that being said he tells me that in the past couple of days he and his wife have noticed that we did not seem to be doing quite as well is getting dark near the center. Subsequently upon evaluation today the wound actually does appear to be doing worse compared to previous. He has been tolerating  the dressing changes otherwise and he states that he is not been sitting up anymore than he was in the past from what he tells me. Still he has continued to work he states "I'm tired of dealing with this and if I have to just go home and lay in the bed all the time that's what I'll do". Nonetheless I am concerned about the fact that this wound does appear to be deeper than what it was previous. 05/24/18 upon evaluation today patient actually presents after having been in the hospital due to what was presumed to be sepsis secondary to the wound infection. He had an elevated white blood cell count between 14 and 15. With that being said he does seem to be doing somewhat better now. His wound still is giving him some trouble  nonetheless and he is obviously concerned about the fact likely talked about that this does seem to go more deeply than previously noted. I did review his wound culture which showed evidence of Staphylococcus aureus him and group B strep. Nonetheless he is on antibiotics, Levaquin, for this. Subsequently I did review his intake summary from the hospital as well. I also did look at the CT of the lumbar spine with contrast that was performed which showed no bone destruction to suggest lumbar disguises/osteomyelitis or sacral osteomyelitis. There was no paraspinal abscess. Nonetheless it appears this may have been more of just a soft tissue infection at this point which is good news. He still is nonetheless concerned about the wound which again I think is completely reasonable considering everything he's been through recently. 05/31/18 on evaluation today on evaluation today patient actually appears to be showing signs of his wound be a little bit deeper than what I would like to see. Fortunately he does not show any signs of significant infection although his temperature was 99 today he states he's been checking this at home and has not been elevated. Nonetheless with the undermining that I'm seeing at this point I am becoming more concerned about the wound I do think that offloading is a key factor here that is preventing the speedy recovery at this point. There does not appear to be any evidence of again over infection noted. He's been using Santyl currently. 06/07/18 the patient presents today for follow-up evaluation regarding the left ulcer in the gluteal region. He has been tolerating the Wound VAC fairly well. He is obviously very frustrated with this he states that to mean is really getting in his way. There does not appear to be any evidence of infection at this time he does have a little bit of odor I do not necessarily associate this with infection just something that we sometimes notice  with Wound VAC therapy. With that being said I can definitely catch a tone of discontentment overall in the patient's demeanor today. This when he was previously in the hospital [name], TROM. ([id number]) an CT scan was done of the lumbar region which did not reveal any signs of osteomyelitis. With that being said the pelvis in particular was not evaluated distinctly which means he could still have some osteonecrosis I. Nonetheless the Wound VAC was started on Thursday I do want to get this little bit more time before jumping to a CT scan of the pelvis although that is something that I might would recommend if were not see an improvement by that time. 06/14/18 on evaluation today patient actually appears to be doing about the same in regard to  his right gluteal ulcer. Again he did have a CT scan of the lumbar spine unfortunately this did not include the pelvis. Nonetheless with the depth of the wound that I'm seeing today even despite the fact that I'm not seeing any evidence of overt cellulitis I believe there's a good chance that we may be dealing with osteomyelitis somewhere in the right Ischial region. No fevers, chills, nausea, or vomiting noted at this time. 06/21/18 on evaluation today patient actually appears to be doing about the same with regard to his wound. The tunnel at 6 o'clock really does not appear to be any deeper although it is a little bit wider. I think at this point you may want to start packing this with white phone. Unfortunately I have not got approval for the CT scan of the pelvis as of yet due to the fact that Medicare apparently has been denied it due to the diagnosis codes not being appropriate according to Medicare for the test requested. With that being said the patient cannot have an MRI and therefore this is the only option that we have as far as testing is concerned. The patient has had infection and was on antibiotics and been added code for cellulitis of the  bottom to see if this will be appropriate for getting the test approved. Nonetheless I'm concerned about the infection have been spread deeper into the Ischial region. 06/28/18 on evaluation today patient actually appears to be doing rather well all things considered in regard to the right gluteal ulcer. He has been tolerating the dressing changes without complication. With that being said the Wound VAC he states does have to be replaced almost every day or at least reinforced unfortunately. Patient actually has his CT scan later this morning we should have the results by tomorrow. 07/05/18 on evaluation today patient presents for follow-up concerning his right Ischial ulcer. He did see the surgeon Dr. Lysle Pearl last week. They were actually very happy with him and felt like he spent a tremendous amount of time with them as far as discussing his situation was concerned. In the end Dr. Lysle Pearl did contact me as well and determine that he would not recommend any surgical intervention at this point as he felt like it would not be in the patient's best interest based on what he was seeing. He recommended a referral to infectious disease. Subsequently this is something that Dr. Ines Bloomer office is working on setting up for the patient. As far as evaluation today is concerned the patient's wound actually appears to be worse at this point. I am concerned about how things are progressing and specifically about infection. I do not feel like it's the deeper but the area of depth is definitely widening which does have me concerned. No fevers, chills, nausea, or vomiting noted at this time. I think that we do need initiate antibiotic therapy the patient has an allow allergy to amoxicillin/penicillin he states that he gets a rash since childhood. Nonetheless she's never had the issues with Catholics or cephalosporins in general but he is aware of. 07/27/18 on evaluation today patient presents following admission to the  hospital on 07/09/18. He was subsequently discharged on 07/20/18. On 07/15/18 the patient underwent irrigation and debridement was soft tissue biopsy and bone biopsy as well as placement of a Wound VAC in the OR by Dr. Celine Ahr. During the hospital course the patient was placed on a Wound VAC and recommended follow up with surgery in three weeks actually with  Dr. Delaine Lame who is infectious disease. The patient was on vancomycin during the hospital course. 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 He also had a bone culture which revealed evidence of methicillin-resistant staph aureus. He is updated CT scan 07/09/18 reveals that he had progression of the which was performed on wound to breakdown down to the trochanter where he actually had irregularities there as well suggestive of osteomyelitis. This was a change just since 9 December when we last performed a CT scan. Obviously this one had gone downhill quite significantly and rapidly. At this point upon evaluation I feel like in general the patient's wound seems to be doing fairly well all things considered upon my evaluation today. Obviously this is larger and deeper than what I previously evaluated but at the same time he seems to be making some progress as far as the appearance of the granulation tissue is concerned. I'm happy in that regard. No fevers, chills, nausea, or vomiting noted at this time. He is on IV vancomycin and Rocephin at the facility. He is currently in [facility]. 08/03/18 upon evaluation today patient's wound appears to be doing better in regard to the overall appearance at this point in time. Fortunately he's been tolerating the Wound VAC without complication and states that the facility has been taking excellent care of the wound site. Overall I see some Slough noted on the surface which I am going to attempt sharp debridement today of but nonetheless other than this I feel like  he's making progress. 08/09/18 on evaluation today patient's wound appears to be doing much better compared to even last week's evaluation. Do believe that the Wound VAC is been of great benefit for him. He has been tolerating the dressing changes that is the Wound WILNER, LAURICH. ([id number]) VAC without any complication and he has excellent granulation noted currently. There is no need for sharp debridement at this point. 08/16/18 on evaluation today patient actually appears to be doing very well in regard to the wound in the right gluteal fold region. This is showing signs of progress and again appears to be very healthy which is excellent news. Fortunately there is no sign of active infection by way of odor or drainage at this point. Overall I'm very pleased with how things stand. He seems to be tolerating the Wound VAC without complication. 08/23/18 on evaluation today patient actually appears to be doing better in regard to his wound. He has been tolerating the Wound VAC without complication and in fact it has been collecting a significant amount of drainage which I think is good news especially considering how the wound appears. Fortunately there is no signs of infection at this time definitely nothing appears to be worse which is good news. He has not been started on the Bactrim and Flagyl that was recommended by Dr. Delaine Lame yet. I did actually contact her office this morning in order to check and see were things are that regard their gonna be calling me back. 08/30/18 on evaluation today patient actually appears to show signs of excellent improvement today compared to last evaluation. The undermining is getting much better the wound seems to be feeling quite nicely and I'm very pleased that the granulation in general. With that being said overall I feel like the patient has made excellent progress which is great news. No fevers, chills, nausea, or vomiting noted at this time. 09/06/18  on evaluation today patient actually appears to be doing  rather well in regard to his right gluteal ulcer. This is showing signs of improvement in overall I'm very pleased with how things seem to be progressing. The patient likewise is please. Overall I see no evidence of infection he is about to complete his oral antibiotic regimen which is the end of the antibiotics for him in just about three days. 09/13/18 on evaluation today patient's right Ischial ulcer appears to be showing signs of continued improvement which is excellent news. He's been tolerating the dressing changes without complication. Fortunately there's no signs of infection and the wound that seems to be doing very well. 09/28/18 on evaluation today patient appears to be doing rather well in regard to his right Ischial ulcer. He's been tolerating the Wound VAC without complication he knows there's much less drainage than there used to be this obviously is not a bad thing in my pinion. There's no evidence of infection despite the fact is but nothing about it now for several weeks. 10/04/18 on evaluation today patient appears to be doing better in regard to his right Ischial wound. He has been tolerating the Wound VAC without complication and I do believe that the silver nitrate last week was beneficial for him. Fortunately overall there's no evidence of active infection at this time which is great news. No fevers, chills, nausea, or vomiting noted at this time. 10/11/18 on evaluation today patient actually appears to be doing rather well in regard to his Ischial ulcer. He's been tolerating the Wound VAC still without complication I feel like this is doing a good job. No fevers, chills, nausea, or vomiting noted at this time. 11/01/18 on evaluation today patient presents after having not been seen in our clinic for several weeks secondary to the fact that he was on evaluation today patient presents after having not been seen in our clinic  for several weeks secondary to the fact that he was in a skilled nursing facility which was on lockdown currently due to the covert 19 national emergency. Subsequently he was discharged from the facility on this past Friday and subsequently made an appointment to come in to see yesterday. Fortunately there's no signs of active infection at this time which is good news and overall he does seem to have made progress since I last saw. Overall I feel like things are progressing quite nicely. The patient is having no pain. 11/08/18 on evaluation today patient appears to be doing okay in regard to his right gluteal ulcer. He has been utilizing a Wound VAC home health this changing this at this point since he's home from the skilled nursing facility. Fortunately there's no signs of obvious active infection at this time. Unfortunately though there's no obvious active infection he is having some maceration and his wife states that when the sheets of the Wound VAC office on Sunday when it broke seal that he ended up having significant issues with some smell as well there concerned about the possibility of infection. Fortunately there's No fevers, chills, nausea, or vomiting noted at this time. 11/15/18 on evaluation today patient actually appears to be doing well in regard to his right gluteal ulcer. He has been tolerating the dressing changes without complication. Specifically the Wound VAC has been utilized up to this point. Fortunately there's no signs of infection and overall I feel like he has made progress even since last week when I last saw him. I'm actually fairly happy with the overall appearance although he does seem to have somewhat  of a hyper granular TISHON, LOOMIS. ([id number]) overgrowth in the central portion of the wound which I think may require some sharp debridement to try flatness out possibly utilizing chemical cauterization following. 11/23/18 on evaluation today patient actually  appears to be doing very well in regard to his sacral ulcer. He seems to be showing signs of improvement with good granulation. With that being said he still has the small area of hyper granulation right in the central portion of the wound which I'm gonna likely utilize silver nitrate on today. Subsequently he also keeps having a leak at the 6 o'clock location which is unfortunate we may be able to help out with some suggestions to try to prevent this going forward. Fortunately there's no signs of active infection at this time. 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 He's been tolerating the dressing changes without complication. Fortunately there's no signs of active infection at this time. I've been rather pleased with how things have progressed there still some evidence of pressure getting to the area with some redness right around the immediate wound opening. Nonetheless other than this I'm not seeing any significant complications or issues the wound is somewhat hyper granular. Upon discussing with the patient and his wife today I'm not sure that the wound is being packed to the base with the foam at this point. And if it's not been packed fully that may be part of the reason why is not seen as much improvement as far as the granulation from the base out. Again we do not want pack too tightly but we need some of the firm to get to the base of the wound. I discussed this with patient and his wife today. 12/06/18 on evaluation today patient appears to be doing well in regard to his right gluteal pressure ulcer. He's been tolerating the dressing changes without complication. Fortunately there's no signs of active infection. He still has some hyper granular tissue and I do think it would be appropriate to continue with the chemical cauterization as of today. 12/16/18 on evaluation today patient  actually appears to be doing okay in regard to his right gluteal ulcer. He is been tolerating the dressing changes without complication including the Wound VAC. Overall I feel like nothing seems to be worsening I do feel like that the hyper granulation buds in the central portion of the wound have improved to some degree with the silver nitrate. We will have to see how things continue to progress. 12/20/18 on evaluation today patient actually appears to be doing much worse in my pinion even compared to last week's evaluation. Unfortunately as opposed to showing any signs of improvement the areas of hyper granular tissue in the central portion of the wound seem to be getting worse. Subsequently the wound bed itself also seems to be getting deeper even compared to last week which is both unusual as well as concerning since prior he had been shown signs of improvement. Nonetheless I think that the issue could be that he's actually having some difficulty in issues with a deeper infection. There's no external signs of infection but nonetheless I am more worried about the internal, osteomyelitis, that could be restarting. He has not been on antibiotics for some time at this point. I think that it may be a good idea to go ahead and started back on an antibiotic therapy while we wait  to see what the testing shows. 12/27/18 on evaluation today patient presents for follow-up concerning his left gluteal fold wound. Fortunately he appears to be doing well today. I did review the CT scan which was negative for any signs of osteomyelitis or acute abnormality this is excellent news. Overall I feel like the surface of the wound bed appears to be doing significantly better today compared to previously noted findings. There does not appear any signs of infection nor does he have any pain at this time. 01/03/19 on evaluation today patient actually appears to be doing quite well in regard to his ulcer. Post debridement last  week he really did not have too much bleeding which is good news. Fortunately today this seems to be doing some better but we still has some of the hyper granular tissue noted in the base of the wound which is gonna require sharp debridement today as well. Overall I'm pleased with how things seem to be progressing since we switched away from the Wound VAC I think he is making some progress. 01/10/19 on evaluation today patient appears to be doing better in regard to his right gluteal fold ulcer. He has been tolerating the dressing changes without complication. The debridement to seem to be helping with current away some of the poor hyper granular tissue bugs throughout the region of his gluteal fold wound. He's been tolerating the dressing changes otherwise without complication which is great news. No fevers, chills, nausea, or vomiting noted at this time. 01/17/19 on evaluation today patient actually appears to be doing excellent in regard to his wound. He's been tolerating the dressing changes without complication. Fortunately there is no signs of active infection at this time which is great news. No fevers, chills, nausea, or vomiting noted at this time. 01/24/19 on evaluation today patient actually appears to be doing quite well with regard to his ulcer. He has been tolerating the dressing changes without complication. Fortunately there's no signs of active infection at this time. Overall been very pleased with the progress that he seems to be making currently. 01/31/19-Patient returns at 1 week with apparent similarity in dimensions to the wound, with no signs of infection, he has been LUCAN, GILBERG. ([id number]) changing dressings twice a day 02/08/19 upon evaluation today patient actually appears to be doing well with regard to his right Ischial ulcer. The wound is not appear to be quite as deep and seems to be making progress which is good news. With that being said I'm still reluctant to  go back to the Wound VAC at this point. He's been having to change the dressings twice a day which is a little bit much in my pinion from the wound care supplies standpoint. I think that possibly attempting to utilize extras orbit may be beneficial this may also help to prevent any additional breakdown secondary to fluid retention in the wound itself. The patient is in agreement with giving this a try. 02/15/19 on evaluation today patient actually appears to be doing decently well with regard to his ulcer in the right to gluteal fold location. He's been tolerating the dressing changes without complication. Fortunately there is no signs of active infection at this time. He is able to keep the current dressing in place more effectively for a day at a time whereas before he was having a changes to to three times a day. The actions or has been helpful in this regard. Fortunately there's no signs of anything getting worse and I  do feel like he showing signs of good improvement with regard to the wound bed status. 02/22/2019 patient appears to be doing very well today with regard to his ulcer in the gluteal fold. Fortunately there is no signs of active infection and he has been tolerating the dressing changes without any complication. Overall extremely pleased with how things seem to be progressing. He has much less of the hyper granular projections within the wound these have slowly been debrided away and he seems to be doing well. The wound bed is more uniform. 03/01/19 on evaluation today patient appears to be doing unfortunately about the same in regard to his gluteal ulcer. He's been tolerating the dressing changes without complication. Fortunately there's no signs of active infection at this time. With that being said he continues to develop these hyper granular projections which I'm unsure of exactly what they are and why they are rising. Nonetheless I explained to the patient that I do believe it  would be a good idea for [place] to stand a biopsy sample for pathology to see if that can shed any light on what exactly may be going on here. Fortunately I do not see any obvious signs of infection. With that being said the patient has had a little bit more drainage this week apparently compared to last week. 03/08/2019 on evaluation today patient actually appears to look somewhat better with regard to the appearance of his wound bed at this time. This is good news. Overall I am very pleased with how things seem to have progressed just in the past week with a switch to the [facility] dressing. I think that has been beneficial for him. With that being said at this time the patient is concerned about his biopsy that I sent off last week unfortunately I do not have that report as of yet. Nonetheless we have called to obtain this and hopefully will hear back from the lab later this morning. 03/15/19 on evaluation today patient's wound actually appears to be doing okay today with regard to the overall appearance of the wound bed. He has been tolerating the dressing changes without complication he still has hyper granular tissue noted but fortunately that seems to be minimal at this point compared to some of what we've seen in the past. Nonetheless I do think that he is still having some issues currently with some of this type of granulation the biopsy and since all showed nothing more than just evidence of granulation tissue. Therefore there really is nothing different to initiate or do at this point. 03/24/19 on evaluation today patient appears to be doing a little better with regard to his ulcer. He's been tolerating the dressing changes without complication. Fortunately there is no signs of active infection at this time. No fevers, chills, nausea, or vomiting noted at this time. I'm overall pleased with how things seem to be progressing. 03/29/2019 on evaluation today patient appears to be doing about the  same in regard to his ulcer in the right gluteal fold. Unfortunately he is not seeming to make a lot of progress and the wound is somewhat stalled. There is no signs of active infection externally but I am concerned about the possibility of infection continuing in the ischial location which previously he did have infection noted. Again is not able to have an MRI so probably our best option for testing for this would be a triple phase bone scan which will detect subtle changes in the bone more so  than plain film x-rays. In the past they really have not been beneficial and in fact the CT scans even have been somewhat questionable at times. Nonetheless there is no signs of systemic infection which is at least good news but again his wound is not healing at all on the predicted schedule. 04/05/19 on evaluation today patient appears to be doing well all things considering with regard to his wound and the right gluteal fold. He actually has his triple bone scan scheduled for sometime in the next couple of days. With that being said I've also been looking to other possibilities of what could be causing hyper granular tissue were looking into the bone scan again for evaluating for the risk or possibility of infection deeper and I'm also gonna go ahead and see about obtaining a deep tissue culture today to send and see if there's any evidence of infection noted on culture. He's in agreement with that plan. 04/12/2019 on evaluation today patient presents for reevaluation here in our clinic concerning ongoing issues with his right gluteal fold ulcer. 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 It was discussed with him at that point that I think it would be appropriate CLAIR, DIAB. ([id number]) for him to proceed with hyperbaric oxygen therapy especially in light of the fact that he is  previously been on IV antibiotics at the beginning of the year for close to 3 months followed by several weeks of oral antibiotics that was all prescribed by infectious disease. He had surgical debridement around Christmas December 2019 due to an abscess and osteomyelitis of the ischial bone. Unfortunately this has not really proceeded as well as we would have liked and again we did a CT scan even a couple months ago as he cannot have an MRI secondary to having issues with both a pain pump as well as a spinal cord stimulator which prevent him from going into an MRI machine. With that being said there were chronic changes noted at the ischial tuberosity which had progressed since December 2019 there was no evidence of fluid collection on that initial CT scan. With that being said that was on January 21, 2019. I am not sure that it was a sensitive enough test as compared to an MRI and then subsequently I ordered a bone scan for him which was actually completed on 03/29/2019 and this revealed that he does indeed have positive osteomyelitis involving the right ischial tuberosity. This is adjacent to the ulcer and I think is the reason that his ulcer is not healing. Subsequently I am in a place him back on oral antibiotics today unfortunately his wound culture showed just mixed gram-negative flora with no specific findings of a predominant organism. Nonetheless being with the location I think a good broad-spectrum antibiotic for gram-negative's is a good choice at this point he is previously taken Levaquin without significant issues and I think that is an appropriate antibiotic for him at this time. 04/19/2019 on evaluation today patient actually appears to be doing fairly well with regard to his wound. He has been tolerating the dressing changes without complication. Fortunately there is no signs of active infection and he has been taking the oral antibiotics at this time. Subsequently we did make a referral to  infectious disease although Dr. Steva Ready wants the patient to be seen by general surgery first in order apparently to see  if there is anything from a surgical standpoint that should be done prior to initiation of IV antibiotic therapy. Again the patient is okay with actually seeing Dr. Celine Ahr whom he has seen before we will make that referral. 04/26/2019 on evaluation today patient actually appears to be doing well with regard to his wound. He has been tolerating the dressing changes without complication. Fortunately there is no signs of active infection at this time. I do believe that the hyperbaric oxygen therapy along with the antibiotics which I prescribed at this point have been doing well for him. With that being said he has not seen Dr. Celine Ahr the surgeon yet in fact they have not been contacted for scheduling an appointment as of yet. Subsequently the patient is not on antibiotics currently by IV Dr. Steva Ready did want him to see Dr. Celine Ahr first which we are working on trying to get scheduled. We again give the information to the patient today for Dr. Celine Ahr and her number as far as contacting their office to see about getting something scheduled. Again were looking for whether or not they recommend any surgical intervention. 05/03/2019 on evaluation today patient appears to be doing about the same with regard to his wound there may be a little bit of filling in in regard to the base of the wound but still he has quite a bit of hyper granular tissue that I am seeing today. I believe that he may may need a more aggressive sharp debridement possibly even by the surgeon or under anesthesia in order to clear away some of the hyper granular material in order to help allow for appropriate granulation to fill in. We have made a referral to Dr. Celine Ahr unfortunately it sounds as if they did not receive the referral we contacted them today they should be get in touch with the patient. Depending on  how things show from that standpoint the patient may need to see Dr. Ardyth Gal who is the infectious disease specialist although he really does not want a PICC line again. No fevers, chills, nausea, vomiting, or diarrhea. He is tolerating hyperbaric oxygen therapy very well. 05/12/2019 on evaluation today patient presents for follow-up visit concerning his right gluteal fold ulcer. He did see Dr. Celine Ahr the surgeon who previously evaluated his wound and she actually felt like he was doing quite well with regard to the wound based on what she was seen. He does seem to be responding to some degree with the oral antibiotics along with hyperbaric oxygen therapy at this point she did not see any evidence or need for further surgical intervention at this point. She recommended deferring additional or ongoing antibiotic therapy to Dr. Steva Ready at her discretion. Fortunately there is no signs of active systemic infection. No fevers, chills, nausea, vomiting, or diarrhea. 10/29; patient I am seeing really for the first time today in terms of his wound. 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 He is being treated with hyperbaric oxygen for underlying osteomyelitis most recently documented by a three-phase bone scan. He has been on Levaquin for roughly a month and he is out of these and asked me for a refill. Most recent cultures were negative although I do not think these were bone cultures. He has a very irregular surface to this wound which is almost nodular in texture. It undermines superiorly with the same nodular hyper granulated surface. I see that he was referred to general surgery for an operative debridement although  the surgeon did not agree with this I think that would have been the proper course of action in this. He has been using Hydrofera Blue. He is supposed to start a wound VAC tomorrow which is actually a reapplication of wound VAC. I think mostly last time they  had trouble keeping the seal in place I hope we have better look at this this time. GURNIE, SURGENER ([id number]) Objective Constitutional Vitals Time Taken: 3:05 PM, Height: 73 in, Weight: 210 lbs, BMI: 27.7, Temperature: 98.1 F, Pulse: 60 bpm, Respiratory Rate: 16 breaths/min, Blood Pressure: 132/82 mmHg. Integumentary (Hair, Skin) Wound #1 status is Open. Original cause of wound was Pressure Injury. The wound is located on the Right Gluteal fold. The wound measures 3cm length x 2.4cm width x 2.6cm depth; 5.655cm^2 area and 14.703cm^3 volume. There is muscle and Fat Layer (Subcutaneous Tissue) Exposed exposed. There is no tunneling or undermining noted. There is a large amount of serous drainage noted. The wound margin is epibole. There is large (67-100%) pink, hyper - granulation within the wound bed. There is a small (1-33%) amount of necrotic tissue within the wound bed including Adherent Slough. 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 (continuing rx) for osteomyelitis starting 05/19/2019 1. I gave him 2 further weeks of Levaquin to make 6 weeks of total treatment. Noteworthy that this was not based on culture data. 2. The base of this wound is very irregular and I am not sure how viable surface is to support granulation. I would have agreed that a surgical debridement done in the OR with the ability to cauterize would probably have been the best course of action here however the surgeon did not agree. This would be an extensive and difficult to debridement to attempt in this clinic. Furthermore we do not have the ability to cauterize bleeding 3.  In spite of #2 I think retrying the wound VAC is really the best course of action here. We will use silver collagen at the base of this under the wound VAC. 4. Finally I did discuss some myocutaneous flap through plastic surgery as the final option here. This is a difficult discussion and there is set of guidelines that most plastic surgeons insist on before they will agree to do a procedure like this. I did go over this with the patient. For now I think a trial at the wound VAC is the right course of action with plastic surgery as the option. 5. He tolerates hyperbarics well KYSON, CICHOWSKI ([id number]) Electronic Signature(s) Signed: 05/19/2019 5:39:42 PM By: Linton Ham MD Entered By: Linton Ham on 05/19/2019 17:18:08 CLEDIS, SMITHWICK ([id number]) -------------------------------------------------------------------------------- SuperBill Details Patient Name: [name] Date of Service: 05/19/2019 Medical Record Number: [id number] Patient Account Number: [id number] Date of Birth/Sex: 1951-06-23 (68 y.o. 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

## 2019-05-20 ENCOUNTER — Encounter: Payer: Medicare Other | Admitting: Internal Medicine

## 2019-05-20 ENCOUNTER — Other Ambulatory Visit: Payer: Self-pay

## 2019-05-20 DIAGNOSIS — L89314 Pressure ulcer of right buttock, stage 4: Secondary | ICD-10-CM | POA: Diagnosis not present

## 2019-05-20 NOTE — Progress Notes (Signed)
CAHILL, FEASER ([id number]) Visit Report for 05/19/2019 Arrival Information Details Patient Name: [name], [name] Date of Service: 05/19/2019 3:00 PM Medical Record Number: [id number] Patient Account Number: [id number] Date of Birth/Sex: Oct 03, 1950 (68 y.o. M) Treating RN: Army Melia Primary Care Zenobia Kuennen: [name] Other Clinician: [name] Referring Diane Hanel: [name] Treating Caysen Whang/Extender: [name] in Treatment: 58 Visit Information History Since Last Visit Added or deleted any medications: No Patient Arrived: Wheel Chair Any new allergies or adverse reactions: No Arrival Time: 15:08 Had a fall or experienced change in No Accompanied By: wife activities of daily living that may affect Transfer Assistance: Manual risk of falls: Patient Identification Verified: Yes Signs or symptoms of abuse/neglect since last visito No Secondary Verification Process Completed: Yes Hospitalized since last visit: No Patient Requires Transmission-Based No Implantable device outside of the clinic excluding No Precautions: cellular tissue based products placed in the center Patient Has Alerts: Yes since last visit: Patient Alerts: NOT Has Dressing in Place as Prescribed: Yes Diabetic Pain Present Now: No Electronic Signature(s) Signed: 05/19/2019 3:18:29 PM By: Lorine Bears RCP, RRT, CHT Entered By: Lorine Bears on 05/19/2019 15:08:57 [name] ([id number]) -------------------------------------------------------------------------------- Clinic Level of Care Assessment Details Patient Name: [name] Date of Service: 05/19/2019 3:00 PM Medical Record Number: [id number] Patient Account Number: [id number] Date of Birth/Sex: 10/04/1950 (68 y.o. M) Treating RN: Army Melia Primary Care Jahsiah Carpenter: [name] Other Clinician: [name] Referring Zebastian Carico: [name] Treating Smrithi Pigford/Extender: [name] in Treatment: 79 Clinic Level of Care Assessment Items TOOL 4 Quantity Score []  - Use when only an EandM is performed on FOLLOW-UP visit 0 ASSESSMENTS - Nursing Assessment / Reassessment X - Reassessment of Co-morbidities (includes updates in patient status) 1 10 X- 1 5 Reassessment of Adherence to Treatment Plan ASSESSMENTS - Wound and Skin Assessment / Reassessment X - Simple Wound Assessment / Reassessment - one wound 1 5 []  - 0 Complex Wound Assessment / Reassessment - multiple wounds []  - 0 Dermatologic / Skin Assessment (not related to wound area) ASSESSMENTS - Focused Assessment []  - Circumferential Edema Measurements - multi extremities 0 []  - 0 Nutritional Assessment / Counseling / Intervention []  - 0 Lower Extremity Assessment (monofilament, tuning fork, pulses) []  - 0 Peripheral Arterial Disease Assessment (using hand held doppler) ASSESSMENTS - Ostomy and/or Continence Assessment and Care []  - Incontinence Assessment and Management 0 []  - 0 Ostomy Care Assessment and Management (repouching, etc.) PROCESS - Coordination of Care X - Simple Patient / Family Education for ongoing care 1 15 []  - 0 Complex (extensive) Patient / Family Education for ongoing care []  - 0 Staff obtains Programmer, systems, Records, Test Results / Process Orders []  - 0 Staff telephones HHA, Nursing Homes / Clarify orders / etc []  - 0 Routine Transfer to another Facility (non-emergent condition) []  - 0 Routine Hospital Admission (non-emergent condition) []  - 0 New Admissions / Biomedical engineer / Ordering NPWT, Apligraf, etc. []  - 0 Emergency Hospital Admission (emergent condition) X- 1 10 Simple Discharge Coordination TYTON, ROTTIER. ([id number]) []  - 0 Complex (extensive) Discharge Coordination PROCESS - Special Needs []  - Pediatric / Minor Patient Management 0 []  - 0 Isolation Patient Management []  - 0 Hearing /  Language / Visual special needs []  - 0 Assessment of Community assistance (transportation, D/C planning, etc.) []  - 0 Additional assistance / Altered mentation []  - 0 Support Surface(s) Assessment (bed, cushion, seat, etc.) INTERVENTIONS - Wound Cleansing / Measurement X -  Simple Wound Cleansing - one wound 1 5 []  - 0 Complex Wound Cleansing - multiple wounds X- 1 5 Wound Imaging (photographs - any number of wounds) []  - 0 Wound Tracing (instead of photographs) X- 1 5 Simple Wound Measurement - one wound []  - 0 Complex Wound Measurement - multiple wounds INTERVENTIONS - Wound Dressings []  - Small Wound Dressing one or multiple wounds 0 X- 1 15 Medium Wound Dressing one or multiple wounds []  - 0 Large Wound Dressing one or multiple wounds []  - 0 Application of Medications - topical []  - 0 Application of Medications - injection INTERVENTIONS - Miscellaneous []  - External ear exam 0 []  - 0 Specimen Collection (cultures, biopsies, blood, body fluids, etc.) []  - 0 Specimen(s) / Culture(s) sent or taken to Lab for analysis []  - 0 Patient Transfer (multiple staff / Civil Service fast streamer / Similar devices) []  - 0 Simple Staple / Suture removal (25 or less) []  - 0 Complex Staple / Suture removal (26 or more) []  - 0 Hypo / Hyperglycemic Management (close monitor of Blood Glucose) []  - 0 Ankle / Brachial Index (ABI) - do not check if billed separately X- 1 5 Vital Signs Hackman, Festus J. ([id number]) Has the patient been seen at the hospital within the last three years: Yes Total Score: 80 Level Of Care: New/Established - Level 3 Electronic Signature(s) Signed: 05/20/2019 9:12:39 AM By: Army Melia Entered By: Army Melia on 05/19/2019 16:10:52 [name] ([id number]) -------------------------------------------------------------------------------- Encounter Discharge Information Details Patient Name: [name] Date of Service: 05/19/2019 3:00 PM Medical  Record Number: [id number] Patient Account Number: [id number] Date of Birth/Sex: 1951-02-21 (68 y.o. M) Treating RN: Army Melia Primary Care Kabella Cassidy: [name] Other Clinician: [name] Referring Ayaan Shutes: [name] Treating Antion Andres/Extender: [name] in Treatment: 50 Encounter Discharge Information Items Discharge Condition: Stable Ambulatory Status: Wheelchair Discharge Destination: Home Transportation: Private Auto Accompanied By: wife Schedule Follow-up Appointment: Yes Clinical Summary of Care: Electronic Signature(s) Signed: 05/19/2019 4:11:41 PM By: Army Melia Entered By: Army Melia on 05/19/2019 16:11:41 [name] ([id number]) -------------------------------------------------------------------------------- Lower Extremity Assessment Details Patient Name: [name] Date of Service: 05/19/2019 3:00 PM Medical Record Number: [id number] Patient Account Number: [id number] Date of Birth/Sex: 1951/07/16 (68 y.o. M) Treating RN: Montey Hora Primary Care Floy Angert: [name] Other Clinician: [name] Referring Pankaj Haack: [name] Treating Shayra Anton/Extender: Ricard Dillon Weeks in Treatment: 55 Electronic Signature(s) Signed: 05/19/2019 3:13:56 PM By: Montey Hora Entered By: Montey Hora on 05/19/2019 15:13:56 PEARLEY, THRESHER ([id number]) -------------------------------------------------------------------------------- Multi Wound Chart Details Patient Name: [name] Date of Service: 05/19/2019 3:00 PM Medical Record Number: [id number] Patient Account Number: [id number] Date of Birth/Sex: 1951-07-10 (68 y.o. M) Treating RN: Army Melia Primary Care Mikolaj Woolstenhulme: [name] Other Clinician: [name] Referring Olanda Boughner: [name] Treating Khaiden Segreto/Extender: [name] in Treatment: 83 Vital Signs Height(in): 73 Pulse(bpm):  60 Weight(lbs): 210 Blood Pressure(mmHg): 132/82 Body Mass Index(BMI): 28 Temperature(F): 98.1 Respiratory Rate 16 (breaths/min): Photos: [N/A:N/A] Wound Location: Right Gluteal fold N/A N/A Wounding Event: Pressure Injury N/A N/A Primary Etiology: Pressure Ulcer N/A N/A Comorbid History: Cataracts, Hypertension, N/A N/A Paraplegia Date Acquired: 11/02/2017 N/A N/A Weeks of Treatment: 76 N/A N/A Wound Status: Open N/A N/A Measurements L x W x D 3x2.4x2.6 N/A N/A (cm) Area (cm) : 5.655 N/A N/A Volume (cm) : 14.703 N/A N/A % Reduction in Area: 43.70% N/A N/A % Reduction in Volume: -1363.00% N/A N/A Classification: Category/Stage IV N/A N/A Exudate Amount:  Large N/A N/A Exudate Type: Serous N/A N/A Exudate Color: amber N/A N/A Wound Margin: Epibole N/A N/A Granulation Amount: Large (67-100%) N/A N/A Granulation Quality: Pink, Hyper-granulation N/A N/A Necrotic Amount: Small (1-33%) N/A N/A Exposed Structures: Fat Layer (Subcutaneous N/A N/A Tissue) Exposed: Yes Muscle: Yes Fascia: No Tendon: No Joint: No Bone: No FUE, STIPES. ([id number]) Epithelialization: Small (1-33%) N/A N/A Treatment Notes Wound #1 (Right Gluteal fold) Notes zinc to peri wound, Hydrafera Blue, xtrasorb and tegaderm Electronic Signature(s) Signed: 05/19/2019 5:39:42 PM By: Linton Ham MD Previous Signature: 05/19/2019 4:08:50 PM Version By: Army Melia Entered By: Linton Ham on 05/19/2019 17:08:20 [name], TKACHENKO ([id number]) -------------------------------------------------------------------------------- [place] Details Patient Name: [name] Date of Service: 05/19/2019 3:00 PM Medical Record Number: [id number] Patient Account Number: [id number] Date of Birth/Sex: 1951/07/15 (68 y.o. M) Treating RN: Army Melia Primary Care Dorothee Napierkowski: [name] Other Clinician: [name] Referring Laronda Lisby: [name] Treating  Amorita Vanrossum/Extender: [name] in Treatment: 53 Active Inactive Orientation to the Wound Care Program Nursing Diagnoses: Knowledge deficit related to the wound healing center program Goals: Patient/caregiver will verbalize understanding of the [facility] Program Date Initiated: 11/30/2017 Target Resolution Date: 12/21/2017 Goal Status: Active Interventions: Provide education on orientation to the wound center Notes: Pressure Nursing Diagnoses: Knowledge deficit related to management of pressures ulcers Goals: Patient/caregiver will verbalize understanding of pressure ulcer management Date Initiated: 05/17/2018 Target Resolution Date: 05/28/2018 Goal Status: Active Interventions: Assess: immobility, friction, shearing, incontinence upon admission and as needed Notes: Wound/Skin Impairment Nursing Diagnoses: Impaired tissue integrity Goals: Patient/caregiver will verbalize understanding of skin care regimen Date Initiated: 11/30/2017 Target Resolution Date: 12/21/2017 Goal Status: Active Ulcer/skin breakdown will have a volume reduction of 30% by week 4 Date Initiated: 11/30/2017 Target Resolution Date: 12/21/2017 Goal Status: Active DMON, LITAKER ([id number]) Interventions: Assess patient/caregiver ability to obtain necessary supplies Assess patient/caregiver ability to perform ulcer/skin care regimen upon admission and as needed Assess ulceration(s) every visit Treatment Activities: Skin care regimen initiated : 11/30/2017 Notes: Electronic Signature(s) Signed: 05/19/2019 4:08:41 PM By: Army Melia Entered By: Army Melia on 05/19/2019 16:08:40 TEION, CURETON ([id number]) -------------------------------------------------------------------------------- Pain Assessment Details Patient Name: [name] Date of Service: 05/19/2019 3:00 PM Medical Record Number: [id number] Patient Account Number: [id number] Date of Birth/Sex: [birth year]  (68 y.o. M) Treating RN: Army Melia Primary Care Athol Bolds: [name] Other Clinician: [name] Referring Lanis Storlie: [name] Treating Percell Lamboy/Extender: [name] in Treatment: 56 Active Problems Location of Pain Severity and Description of Pain Patient Has Paino No Site Locations Pain Management and Medication Current Pain Management: Electronic Signature(s) Signed: 05/19/2019 3:18:29 PM By: Lorine Bears RCP, RRT, CHT Signed: 05/20/2019 9:12:39 AM By: Army Melia Entered By: Lorine Bears on 05/19/2019 15:09:05 MICHAELLEE, WEIDER ([id number]) -------------------------------------------------------------------------------- Patient/Caregiver Education Details Patient Name: [name] Date of Service: 05/19/2019 3:00 PM Medical Record Number: [id number] Patient Account Number: [id number] Date of Birth/Gender: [birth year] (68 y.o. M) Treating RN: Army Melia Primary Care Physician: [name] Other Clinician: [name] Referring Physician: [name] Treating Physician/Extender: [name] in Treatment: 32 Education Assessment Education Provided To: Patient Education Topics Provided Pressure: Handouts: Pressure Ulcers: Care and Offloading Methods: Demonstration, Explain/Verbal Responses: State content correctly Wound/Skin Impairment: Handouts: Caring for Your Ulcer Methods: Demonstration, Explain/Verbal Responses: State content correctly Electronic Signature(s) Signed: 05/20/2019 9:12:39 AM By: Army Melia Entered By: Army Melia on 05/19/2019 16:11:12 Campos, [name] -------------------------------------------------------------------------------- Wound Assessment Details Patient Name: Tamera Punt,  Xachary J. Date of Service: 05/19/2019 3:00 PM Medical Record Number: [id number] Patient Account Number: [id number] Date of Birth/Sex: Jan 14, 1951 (68  y.o. M) Treating RN: Montey Hora Primary Care Eunice Oldaker: [name] Other Clinician: [name] Referring La Dibella: [name] Treating Kierrah Kilbride/Extender: [name] in Treatment: 22 Wound Status Wound Number: 1 Primary Etiology: Pressure Ulcer Wound Location: Right Gluteal fold Wound Status: Open Wounding Event: Pressure Injury Comorbid History: Cataracts, Hypertension, Paraplegia Date Acquired: 11/02/2017 Weeks Of Treatment: 76 Clustered Wound: No Photos Wound Measurements Length: (cm) 3 Width: (cm) 2.4 Depth: (cm) 2.6 Area: (cm) 5.655 Volume: (cm) 14.703 % Reduction in Area: 43.7% % Reduction in Volume: -1363% Epithelialization: Small (1-33%) Tunneling: No Undermining: No Wound Description Classification: Category/Stage IV Foul Odor Wound Margin: Epibole Slough/Fi Exudate Amount: Large Exudate Type: Serous Exudate Color: amber After Cleansing: No brino Yes Wound Bed Granulation Amount: Large (67-100%) Exposed Structure Granulation Quality: Pink, Hyper-granulation Fascia Exposed: No Necrotic Amount: Small (1-33%) Fat Layer (Subcutaneous Tissue) Exposed: Yes Necrotic Quality: Adherent Slough Tendon Exposed: No Muscle Exposed: Yes Necrosis of Muscle: No Joint Exposed: No Bone Exposed: No Treatment Notes EDDEN, DEEM. ([id number]) Wound #1 (Right Gluteal fold) Notes zinc to peri wound, Hydrafera Blue, xtrasorb and tegaderm Electronic Signature(s) Signed: 05/19/2019 3:15:05 PM By: Montey Hora Entered By: Montey Hora on 05/19/2019 15:15:05 [name] ([id number]) -------------------------------------------------------------------------------- Vitals Details Patient Name: [name] Date of Service: 05/19/2019 3:00 PM Medical Record Number: [id number] Patient Account Number: [id number] Date of Birth/Sex: 07-08-1951 (68 y.o. M) Treating RN: Army Melia Primary Care Margree Gimbel: [name] Other Clinician: [name] Referring May Ozment: [name] Treating Edward Trevino/Extender: [name] in Treatment: 43 Vital Signs Time Taken: 15:05 Temperature (F): 98.1 Height (in): 73 Pulse (bpm): 60 Weight (lbs): 210 Respiratory Rate (breaths/min): 16 Body Mass Index (BMI): 27.7 Blood Pressure (mmHg): 132/82 Reference Range: 80 - 120 mg / dl Electronic Signature(s) Signed: 05/19/2019 3:18:29 PM By: Lorine Bears RCP, RRT, CHT Entered By: Lorine Bears on 05/19/2019 15:09:29

## 2019-05-20 NOTE — Progress Notes (Signed)
JAHIR, KOSSMAN ([id number]) Visit Report for 05/20/2019 Arrival Information Details Patient Name: BRAXON, JOICE Date of Service: 05/20/2019 8:00 AM Medical Record Number: [id number] Patient Account Number: [id number] Date of Birth/Sex: 20-Jul-1951 (68 y.o. M) Treating RN: Montey Hora Primary Care Theona Muhs: [name] Other Clinician: [name] Referring Xaiden Fleig: [name] Treating Cristianna Cyr/Extender: [name] in Treatment: 42 Visit Information History Since Last Visit Added or deleted any medications: No Patient Arrived: Wheel Chair Any new allergies or adverse reactions: No Arrival Time: 08:05 Had a fall or experienced change in No Accompanied By: wife activities of daily living that may affect Transfer Assistance: None risk of falls: Patient Identification Verified: Yes Signs or symptoms of abuse/neglect since last visito No Secondary Verification Process Completed: Yes Hospitalized since last visit: No Patient Requires Transmission-Based No Implantable device outside of the clinic excluding No Precautions: cellular tissue based products placed in the center Patient Has Alerts: Yes since last visit: Patient Alerts: NOT Has Dressing in Place as Prescribed: Yes Diabetic Pain Present Now: No Electronic Signature(s) Signed: 05/20/2019 12:00:17 PM By: Lorine Bears RCP, RRT, CHT Entered By: Lorine Bears on 05/20/2019 08:33:36 KAEMON, SANDEFER ([id number]) -------------------------------------------------------------------------------- Encounter Discharge Information Details Patient Name: [name] Date of Service: 05/20/2019 8:00 AM Medical Record Number: [id number] Patient Account Number: [id number] Date of Birth/Sex: April 02, 1951 (68 y.o. M) Treating RN: Montey Hora Primary Care Darionna Banke: [name] Other Clinician: [name] Referring Ayaan Ringle: [name] Treating Tiney Zipper/Extender: [name] in Treatment: 14 Encounter Discharge Information Items Discharge Condition: Stable Ambulatory Status: Wheelchair Discharge Destination: Home Transportation: Private Auto Accompanied By: wife Schedule Follow-up Appointment: Yes Clinical Summary of Care: Notes Patient has an HBO treatment scheduled on 05/23/2019 at 08:00 am. Electronic Signature(s) Signed: 05/20/2019 12:00:17 PM By: Lorine Bears RCP, RRT, CHT Entered By: Lorine Bears on 05/20/2019 11:59:47 CRIS, LONERO ([id number]) -------------------------------------------------------------------------------- Vitals Details Patient Name: [name] Date of Service: 05/20/2019 8:00 AM Medical Record Number: [id number] Patient Account Number: [id number] Date of Birth/Sex: 1950/11/26 (68 y.o. M) Treating RN: Montey Hora Primary Care Malika Demario: [name] Other Clinician: [name] Referring Jessicamarie Amiri: [name] Treating Cinthya Bors/Extender: [name] in Treatment: 22 Vital Signs Time Taken: 08:15 Temperature (F): 98.3 Height (in): 73 Pulse (bpm): 72 Weight (lbs): 210 Respiratory Rate (breaths/min): 16 Body Mass Index (BMI): 27.7 Blood Pressure (mmHg): 120/70 Reference Range: 80 - 120 mg / dl Electronic Signature(s) Signed: 05/20/2019 12:00:17 PM By: Lorine Bears RCP, RRT, CHT Entered By: Lorine Bears on 05/20/2019 08:34:09

## 2019-05-20 NOTE — Progress Notes (Signed)
[name], [name] ([id number]) Visit Report for 05/20/2019 HBO Details Patient Name: [name], [name] Date of Service: 05/20/2019 8:00 AM Medical Record Number: [id number] Patient Account Number: [id number] Date of Birth/Sex: 1950-12-20 (68 y.o. 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 HBO Treatment Details Treatment Number: 22 Patient Type: Outpatient Chamber Type: Monoplace Chamber Serial #: [id number] Treatment Protocol: 2.0 ATA with 90 minutes oxygen, and no air breaks Treatment Details Compression Rate Down: 1.5 psi / minute De-Compression Rate Up: 2.0 psi / minute Compress Tx Pressure Air breaks and breathing periods Decompress Decompress Begins Reached (leave unused spaces blank) Begins Ends Chamber Pressure (ATA) 1 2 - - - - - - 2 1 Clock Time (24 hr) 08:21 08:33 - - - - - - 10:03 10:11 Treatment Length: 110 (minutes) Treatment Segments: 4 Vital Signs Capillary Blood Glucose Reference Range: 80 - 120 mg / dl HBO Diabetic Blood Glucose Intervention Range: <131 mg/dl or >249 mg/dl Time Vitals Blood Respiratory Capillary Blood Glucose Pulse Action Type: Pulse: Temperature: Taken: Pressure: Rate: Glucose (mg/dl): Meter #: Oximetry (%) Taken: Pre 08:15 120/70 72 16 98.3 Post 10:17 112/74 78 16 98.1 Treatment Response Treatment Completion Status: Treatment Completed without Adverse Event [name] Notes No concerns with treatment given HBO Attestation I certify that I supervised this HBO treatment in accordance with Medicare guidelines. A trained emergency response  Yes team is readily available per hospital policies and procedures. Continue HBOT as ordered. Yes Electronic Signature(s) [name] ([id number]) Signed: 05/20/2019 5:42:32 PM By: Brett Ham MD Previous Signature: 05/20/2019 12:00:17 PM Version By: [name] RCP, RRT, CHT Entered By: [name] on 05/20/2019 17:04:03 [name], [name] ([id number]) -------------------------------------------------------------------------------- HBO Safety Checklist Details Patient Name: [name] Date of Service: 05/20/2019 8:00 AM Medical Record Number: [id number] Patient Account Number: [id number] Date of Birth/Sex: 16-Mar-1951 (68 y.o. M) Treating RN: [name] Primary Care [name]: [name] Other Clinician: [name] Referring [name]: [name] [name] in Treatment: 51 HBO Safety Checklist Items Safety Checklist Consent Form Signed Patient voided / foley secured and emptied When did you last eato morning Last dose of injectable or oral agent n/a Ostomy pouch emptied and vented if applicable NA Medtronic Baclofen Pump; Nevro All implantable devices assessed, documented and approved Spinal Stimulator Intravenous access site secured and place NA Valuables secured Linens and cotton and cotton/polyester blend (less than 51% polyester) Personal oil-based products / skin lotions / body lotions removed Wigs or hairpieces removed NA Smoking or tobacco materials removed NA Books / newspapers / magazines / loose paper removed Cologne, aftershave, perfume and deodorant removed Jewelry removed (may wrap wedding band) Make-up removed NA Hair care products removed Battery operated devices (external) removed Heating patches and chemical warmers removed Titanium eyewear removed NA Nail polish cured greater than 10 hours NA Casting material cured greater than 10  hours NA Hearing aids removed NA Loose dentures or partials removed NA Prosthetics have been removed NA Patient demonstrates correct use of air break device (if applicable) Patient concerns have been addressed Patient grounding bracelet on and cord attached to chamber Specifics for Inpatients (complete in addition to above) Medication sheet sent with patient Intravenous medications needed  or due during therapy sent with patient Drainage tubes (e.g. nasogastric tube or chest tube secured and vented) Endotracheal or Tracheotomy tube secured Cuff deflated of air and inflated with saline Airway suctioned Electronic Signature(s) [name] ([id number]) Signed: 05/20/2019 12:00:17 PM By: [name] RCP, RRT, CHT Entered By: [name] on 05/20/2019 08:34:55

## 2019-05-23 ENCOUNTER — Encounter: Payer: Medicare Other | Attending: Physician Assistant | Admitting: Physician Assistant

## 2019-05-23 ENCOUNTER — Other Ambulatory Visit: Payer: Self-pay

## 2019-05-23 DIAGNOSIS — Z79899 Other long term (current) drug therapy: Secondary | ICD-10-CM | POA: Insufficient documentation

## 2019-05-23 DIAGNOSIS — G8220 Paraplegia, unspecified: Secondary | ICD-10-CM | POA: Diagnosis not present

## 2019-05-23 DIAGNOSIS — Z8249 Family history of ischemic heart disease and other diseases of the circulatory system: Secondary | ICD-10-CM | POA: Insufficient documentation

## 2019-05-23 DIAGNOSIS — I10 Essential (primary) hypertension: Secondary | ICD-10-CM | POA: Insufficient documentation

## 2019-05-23 DIAGNOSIS — Z880 Allergy status to penicillin: Secondary | ICD-10-CM | POA: Diagnosis not present

## 2019-05-23 DIAGNOSIS — M8668 Other chronic osteomyelitis, other site: Secondary | ICD-10-CM | POA: Insufficient documentation

## 2019-05-23 DIAGNOSIS — L89314 Pressure ulcer of right buttock, stage 4: Secondary | ICD-10-CM | POA: Diagnosis not present

## 2019-05-23 DIAGNOSIS — Z881 Allergy status to other antibiotic agents status: Secondary | ICD-10-CM | POA: Insufficient documentation

## 2019-05-23 DIAGNOSIS — L03317 Cellulitis of buttock: Secondary | ICD-10-CM | POA: Insufficient documentation

## 2019-05-23 DIAGNOSIS — C61 Malignant neoplasm of prostate: Secondary | ICD-10-CM | POA: Diagnosis not present

## 2019-05-23 NOTE — Progress Notes (Signed)
LAJUAN, PASCHEN ([id number]) Visit Report for 05/23/2019 Arrival Information Details Patient Name: FILEMON, GIMLIN Date of Service: 05/23/2019 8:00 AM Medical Record Number: [id number] Patient Account Number: [id number] Date of Birth/Sex: [birth year] (68 y.o. M) Treating RN: Harold Barban Primary Care Prosper Paff: [name] Other Clinician: [name] Referring Heavenlee Maiorana: [name] Treating Shanay Woolman/Extender: [name], HOYT Weeks in Treatment: 62 Visit Information History Since Last Visit Added or deleted any medications: No Patient Arrived: Wheel Chair Any new allergies or adverse reactions: No Arrival Time: 08:00 Had a fall or experienced change in No Accompanied By: wife activities of daily living that may affect Transfer Assistance: Harrel Lemon Lift risk of falls: Patient Identification Verified: Yes Signs or symptoms of abuse/neglect since last visito No Secondary Verification Process Completed: Yes Hospitalized since last visit: No Patient Requires Transmission-Based No Implantable device outside of the clinic excluding No Precautions: cellular tissue based products placed in the center Patient Has Alerts: Yes since last visit: Patient Alerts: NOT Has Dressing in Place as Prescribed: Yes Diabetic Pain Present Now: No Electronic Signature(s) Signed: 05/23/2019 11:16:11 AM By: Lorine Bears RCP, RRT, CHT Entered By: Lorine Bears on 05/23/2019 08:02:13 KEIMARI, SCATENA ([id number]) -------------------------------------------------------------------------------- Encounter Discharge Information Details Patient Name: [name] Date of Service: 05/23/2019 8:00 AM Medical Record Number: [id number] Patient Account Number: [id number] Date of Birth/Sex: 03/12/1951 (68 y.o. M) Treating RN: Harold Barban Primary Care Nameer Summer: [name] Other Clinician: [name] Referring [place]: [name] Treating Kodie Pick/Extender: [name], HOYT Weeks in Treatment: 33 Encounter Discharge Information Items Discharge Condition: Stable Ambulatory Status: Wheelchair Discharge Destination: Home Transportation: Private Auto Accompanied By: daughter, Joelene Millin Schedule Follow-up Appointment: Yes Clinical Summary of Care: Notes Patient has an HBO treatment scheduled on 05/24/2019 at 08:00 am. Electronic Signature(s) Signed: 05/23/2019 11:16:11 AM By: Lorine Bears RCP, RRT, CHT Entered By: Lorine Bears on 05/23/2019 11:15:52 ISHMEL, PELT ([id number]) -------------------------------------------------------------------------------- Vitals Details Patient Name: [name] Date of Service: 05/23/2019 8:00 AM Medical Record Number: [id number] Patient Account Number: [id number] Date of Birth/Sex: [birth year] (68 y.o. M) Treating RN: Harold Barban Primary Care Izabelle Daus: [name] Other Clinician: [name] Referring Vidur Knust: [name] Treating Dawne Casali/Extender: [name], HOYT Weeks in Treatment: 58 Vital Signs Time Taken: 08:00 Temperature (F): 98.2 Height (in): 73 Pulse (bpm): 72 Weight (lbs): 210 Respiratory Rate (breaths/min): 16 Body Mass Index (BMI): 27.7 Blood Pressure (mmHg): 124/78 Reference Range: 80 - 120 mg / dl Electronic Signature(s) Signed: 05/23/2019 11:16:11 AM By: Lorine Bears RCP, RRT, CHT Entered By: Lorine Bears on 05/23/2019 08:30:16

## 2019-05-24 ENCOUNTER — Encounter: Payer: Medicare Other | Admitting: Physician Assistant

## 2019-05-24 DIAGNOSIS — L89314 Pressure ulcer of right buttock, stage 4: Secondary | ICD-10-CM | POA: Diagnosis not present

## 2019-05-24 NOTE — Progress Notes (Addendum)
BLADE, OKULA ([id number]) Visit Report for 05/24/2019 Chief Complaint Document Details Patient Name: [name], KITZMANN Date of Service: 05/24/2019 10:30 AM Medical Record Number: [id number] Patient Account Number: [id number] Date of Birth/Sex: [birth year] (68 y.o. M) Treating RN: Montey Hora Primary Care Provider: [name] Other Clinician: Referring Provider: [name] Treating Provider/Extender: [name], HOYT Weeks in Treatment: 79 Information Obtained from: Patient Chief Complaint Right gluteal fold ulcer Electronic Signature(s) Signed: 05/24/2019 10:26:23 AM By: Worthy Keeler [name] Entered By: Worthy Keeler on 05/24/2019 10:26:23 DAVELL, HAFEZ ([id number]) -------------------------------------------------------------------------------- Debridement Details Patient Name: [name] Date of Service: 05/24/2019 10:30 AM Medical Record Number: [id number] Patient Account Number: [id number] Date of Birth/Sex: 1951-06-04 (68 y.o. M) Treating RN: Montey Hora Primary Care Provider: [name] Other Clinician: Referring Provider: [name] Treating Provider/Extender: [name], HOYT Weeks in Treatment: 77 Debridement Performed for Wound #1 Right Gluteal fold Assessment: Performed By: Physician [name], HOYT E., [name] Debridement Type: Debridement Level of Consciousness (Pre- Awake and Alert procedure): Pre-procedure Verification/Time Yes - 10:40 Out Taken: Start Time: 10:40 Pain Control: Lidocaine 4% Topical Solution Total Area Debrided (L x W): 3 (cm) x 2.5 (cm) = 7.5 (cm) Tissue and other material Viable, Non-Viable, Subcutaneous, Hyper-granulation debrided: Level: Skin/Subcutaneous Tissue Debridement Description: Excisional Instrument: Forceps, Scissors Bleeding: Moderate Hemostasis Achieved: Pressure End Time: 10:46 Procedural Pain: 0 Post Procedural Pain: 0 Response to Treatment: Procedure was tolerated  well Level of Consciousness Awake and Alert (Post-procedure): Post Debridement Measurements of Total Wound Length: (cm) 3 Stage: Category/Stage IV Width: (cm) 2.5 Depth: (cm) 2.7 Volume: (cm) 15.904 Character of Wound/Ulcer Post Improved Debridement: Post Procedure Diagnosis Same as Pre-procedure Electronic Signature(s) Signed: 05/24/2019 4:57:37 PM By: Montey Hora Signed: 05/26/2019 6:06:55 PM By: Worthy Keeler [name] Entered By: Montey Hora on 05/24/2019 10:59:20 ALEXIZ, CORDIA ([id number]) -------------------------------------------------------------------------------- HPI Details Patient Name: [name] Date of Service: 05/24/2019 10:30 AM Medical Record Number: [id number] Patient Account Number: [id number] Date of Birth/Sex: 09-18-1950 (68 y.o. 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 He states that there is no history of diabetes that he is aware of. He does have issues with his prostate and is currently receiving treatment for this by way of oral medication. With that being said I do not have a lot of details in that regard. Nonetheless the patient presents today as a result of having been referred to [place] by another provider initially home health was set to come out and take care of his wound although due to the fact that he apparently drives he's not able to receive home health. His wife is therefore trying to help take care of this wound within although they have been struggling with what exactly to do at  this point. She states that she can do some things but she is definitely not a nurse and does have some issues with looking at blood. The good news is the wound does not appear to be too deep and is fairly superficial at this point. There is no slough noted there is some nonviable skin noted around the surface  of the wound and the perimeter at this point. The central portion of the wound appears to be very good with a dermal layer noted this does not appear to be again deep enough to extend it to subcutaneous tissue at this point. 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 In the pump he has Dilaudid and baclofen. 12/07/17 on evaluation today patient presents for follow-up concerning his ongoing lower back thigh ulcer on the right. He states that he did not get the supplies ordered and therefore has not really been able to perform the dressing changes as directed exactly. His wife was able to get some Boarder Foam Dressing's from the drugstore and subsequently has been using hydrogel which did help to a degree in the wound does appear to be able smaller. There is actually more drainage this week noted than previous. 12/21/17 on evaluation today patient appears to be doing rather well in regard to his right gluteal ulcer. He has been tolerating the dressing changes without complication. There does not appear to be any evidence of infection at this point in time. Overall the wound does seem to be making some progress as far as the edges are concerned there's not as much in the way of overlapping of the external wound edges and he has a good epithelium to wound bed border for the most part. This however is not true right at the 12 o'clock location over the span of a little over a centimeters which actually will require debridement today to clean this away and hopefully allow it to continue to heal  more appropriately. 12/28/17 on evaluation today patient appears to be doing rather well in regard to his ulcer in the left gluteal region. He's been tolerating the dressing changes without complication. Apparently he has had some difficulty getting his dressing material. Apparently there's been some confusion with ordering we're gonna check into this. Nonetheless overall he's been showing signs of improvement which is good news. Debridement is not required today. 01/04/18 on evaluation today patient presents for follow-up concerning his right gluteal ulcer. He has been tolerating the dressing changes fairly well. On inspection today it appears he may actually have some maceration them concerned about the fact that he may be developing too much moisture in and around the wound bed which can cause delay in healing. With that being said he unfortunately really has not showed significant signs of improvement since last week's evaluation in fact this may even be just the little bit/slightly larger. Nonetheless he's been having a lot of discomfort I'm not sure this is even related to the wound as he has no pain when I'm to breeding or otherwise cleaning the wound during evaluation today. Nonetheless this is something that we did recommend he talked to his pain specialist concerning. 01/11/18 on evaluation today patient appears to be doing better in regard to his ulceration. He has been tolerating the dressing changes without complication. With that being said overall there's no evidence of infection which is good news. The only thing is he did receive the hatch affair blue classic versus the ready nonetheless I feel like this is perfectly fine and appears to have done well for him over the past week. 01/25/18 on evaluation today patient's wound actually appears to be a little bit larger than during the last evaluation. The good AKING, SHULMAN. ([id number]) news is the majority of  the wound edges actually  appear to be fairly firmly attached to the wound bed unfortunately again we're not really making progress in regard to the size. Roughly the wound is about the same size as when I first saw him although again the wound margin/edges appear to be much better. 02/01/18 on evaluation today patient actually appears to be doing very well in regard to his wound. Applying the Prisma dry does seem to be better although he does still have issues with slow progression of the wound. There was a slight improvement compared to last week's measurements today. Nonetheless I have been considering other options as far as the possibility of Theraskin or even a snap vac. In general I'm not sure that the Theraskin due to location of the wound would be a very good idea. Nonetheless I do think that a snap vac could be a possibility for the patient and in fact I think this could even be an excellent way to manage the wound possibly seeing some improvement in a very rapid fashion here. Nonetheless this is something that we would need to get approved and I did have a lengthy conversation with the patient about this today. 02/08/18 on evaluation today patient appears to be doing a little better in regard to his ulcer. He has been tolerating the dressing changes without complication. Fortunately despite the fact that the wound is a little bit smaller it's not significantly so unfortunately. We have discussed the possibility of a snap vac we did check with insurance this is actually covered at this point. Fortunately there does not appear to be any sign of infection. Overall I'm fairly pleased with how things seem to be appearing at this point. 02/15/18 on evaluation today patient appears to be doing rather well in regard to his right gluteal ulcer. Unfortunately the snap vac did not stay in place with his sheer and friction this came loose and did not seem to maintain seal very well. He worked for about two days and it did seem  to do very well during that time according to his wife but in general this does not seem to be something that's gonna be beneficial for him long-term. I do believe we need to go back to standard dressings to see if we can find something that will be of benefit. 03/02/18- He is here in follow up evaluation; there is minimal change in the wound. He will continue with the same treatment plan, would consider changing to iodosrob/iodoflex if ulcer continues to to plateau. He will follow up next week 03/08/18 on evaluation today patient's wound actually appears to be about the same size as when I previously saw him several weeks back. Unfortunately he does have some slightly dark discoloration in the central portion of the wound which has me concerned about pressure injury. I do believe he may be sitting for too long a period of time in fact he tells me that "I probably sit for much too long". He does have some Slough noted on the surface of the wound and again as far as the size of the wound is concerned I'm really not seeing anything that seems to have improved significantly. 03/15/18 on evaluation today patient appears to be doing fairly well in regard to his ulcer. The wound measured pretty much about the same today compared to last week's evaluation when looking at his graph. With that being said the area of bruising/deep tissue injury that was noted last week I do not see  at this point. He did get a new cushion fortunately this does seem to be have been of benefit in my pinion. It does appear that he's been off of this more which is good news as well I think that is definitely showing in the overall wound measurements. With that being said I do believe that he needs to continue to offload I don't think that the fact this is doing better should be or is going to allow him to not have to offload and explain this to him as well. Overall he seems to be in agreement the plan I think he understands. The  overall appearance of the wound bed is improved compared to last week I think the Iodoflex has been beneficial in that regard. 03/29/18 on evaluation today patient actually appears to be doing rather well in regard to his wound from the overall appearance standpoint he does have some granulation although there's some Slough on the surface of the wound noted as well. With that being said he unfortunately has not improved in regard to the overall measurement of the wound in volume or in size. I did have a discussion with him very specifically about offloading today. He actually does work although he mainly is just sitting throughout the day. He tells me he offloads by "lifting himself up for 30 seconds off of his chair occasionally" purchase from [employer] which does seem to have helped. And he has a new cushion that he with that being said he's also able to stand some for a very short period of time but not significant enough I think to provide appropriate offloading. I think the biggest issue at this point with the wound and the fact is not healing as quickly as we would like is due to the fact that he is really not able to appropriately offload while at work. He states the beginning after his injury he actually had a bed at his job that he could lay on in order to offload and that does seem to have been of help back at that time. Nonetheless he had not done this in quite some time unfortunately. I think that could be helpful for him this is something I would like for him to look into. 04/05/18 on evaluation today patient actually presents for follow-up concerning his right gluteal ulcer. Again he really is not significantly improved even compared to last week. He has been tolerating the dressing changes without complication. With that being said fortunately there appears to be no evidence of infection at this time. He has been more proactive in trying to offload. SHELVY, PERAZZO  ([id number]) 04/12/18 on evaluation today patient actually appears to be doing a little better in regard to his wound and the right gluteal fold region. He's been tolerating the dressing changes since removing the oasis without complication. However he was having a lot of burning initially with the oasis in place. He's unsure of exactly why this was given so much discomfort but he assumes that it was the oasis itself causing the problem. Nonetheless this had to be removed after about three days in place although even those three days seem to have made a fairly good improvement in regard to the overall appearance of the wound bed. In fact is the first time that he's made any improvement from the standpoint of measurements in about six weeks. He continues to have no discomfort over the area of the wound itself which leads me to wonder why he  was having the burning with the oasis when he does not even feel the actual debridement's themselves. I am somewhat perplexed by this. 04/19/18 on evaluation today patient's wound actually appears to be showing signs of epithelialization around the edge of the wound and in general actually appears to be doing better which is good news. He did have the same burning after about three days with applying the Endoform last week in the same fashion that I would generally apply a skin substitute. This seems to indicate that it's not the oasis to cause the problem but potentially the moisture buildup that just causes things to burn or there may be some other reaction with the skin prep or Steri-Strips. Nonetheless I'm not sure that is gonna be able to tolerate any skin substitute for a long period of time. The good news is the wound actually appears to be doing better today compared to last week and does seem to finally be making some progress. 04/26/18 on evaluation today patient actually appears to be doing rather well in regard to his ulcer in the right gluteal fold.  He has been tolerating the dressing changes without complication which is good news. The Endoform does seem to be helping although he was a little bit more macerated this week. This seems to be an ongoing issue with fluid control at this point. Nonetheless I think we may be able to add something like Drawtex to help control the drainage. 05/03/18 on evaluation today patient appears to actually be doing better in regard to the overall appearance of his wound. He has been tolerating the dressing changes without complication. Fortunately there appears to be no evidence of infection at this time. I really feel like his wound has shown signs as of today of turning around last week I thought so as well and definitely he could be seen in this week's overall appearance and measurements. In general I'm very pleased with the fact that he finally seems to be making a steady but sure progress. The patient likewise is very pleased. 05/17/18 on evaluation today patient appears to be doing more poorly unfortunately in regard to his ulcer. He has been tolerating the dressing changes without complication. With that being said he tells me that in the past couple of days he and his wife have noticed that we did not seem to be doing quite as well is getting dark near the center. Subsequently upon evaluation today the wound actually does appear to be doing worse compared to previous. He has been tolerating the dressing changes otherwise and he states that he is not been sitting up anymore than he was in the past from what he tells me. Still he has continued to work he states "I'm tired of dealing with this and if I have to just go home and lay in the bed all the time that's what I'll do". Nonetheless I am concerned about the fact that this wound does appear to be deeper than what it was previous. 05/24/18 upon evaluation today patient actually presents after having been in the hospital due to what was presumed to  be sepsis secondary to the wound infection. He had an elevated white blood cell count between 14 and 15. With that being said he does seem to be doing somewhat better now. His wound still is giving him some trouble nonetheless and he is obviously concerned about the fact likely talked about that this does seem to go more deeply than previously noted. I did  review his wound culture which showed evidence of Staphylococcus aureus him and group B strep. Nonetheless he is on antibiotics, Levaquin, for this. Subsequently I did review his intake summary from the hospital as well. I also did look at the CT of the lumbar spine with contrast that was performed which showed no bone destruction to suggest lumbar disguises/osteomyelitis or sacral osteomyelitis. There was no paraspinal abscess. Nonetheless it appears this may have been more of just a soft tissue infection at this point which is good news. He still is nonetheless concerned about the wound which again I think is completely reasonable considering everything he's been through recently. 05/31/18 on evaluation today on evaluation today patient actually appears to be showing signs of his wound be a little bit deeper than what I would like to see. Fortunately he does not show any signs of significant infection although his temperature was 99 today he states he's been checking this at home and has not been elevated. Nonetheless with the undermining that I'm seeing at this point I am becoming more concerned about the wound I do think that offloading is a key factor here that is preventing the speedy recovery at this point. There does not appear to be any evidence of again over infection noted. He's been using Santyl currently. 06/07/18 the patient presents today for follow-up evaluation regarding the left ulcer in the gluteal region. He has been tolerating the Wound VAC fairly well. He is obviously very frustrated with this he states that to mean is really  getting in his way. There does not appear to be any evidence of infection at this time he does have a little bit of odor I do not necessarily associate this with infection just something that we sometimes notice with Wound VAC therapy. With that being said I can definitely catch a tone of discontentment overall in the patient's demeanor today. This when he was previously in the hospital KALON, ERHARDT. ([id number]) an CT scan was done of the lumbar region which did not reveal any signs of osteomyelitis. With that being said the pelvis in particular was not evaluated distinctly which means he could still have some osteonecrosis I. Nonetheless the Wound VAC was started on Thursday I do want to get this little bit more time before jumping to a CT scan of the pelvis although that is something that I might would recommend if were not see an improvement by that time. 06/14/18 on evaluation today patient actually appears to be doing about the same in regard to his right gluteal ulcer. Again he did have a CT scan of the lumbar spine unfortunately this did not include the pelvis. Nonetheless with the depth of the wound that I'm seeing today even despite the fact that I'm not seeing any evidence of overt cellulitis I believe there's a good chance that we may be dealing with osteomyelitis somewhere in the right Ischial region. No fevers, chills, nausea, or vomiting noted at this time. 06/21/18 on evaluation today patient actually appears to be doing about the same with regard to his wound. The tunnel at 6 o'clock really does not appear to be any deeper although it is a little bit wider. I think at this point you may want to start packing this with white phone. Unfortunately I have not got approval for the CT scan of the pelvis as of yet due to the fact that Medicare apparently has been denied it due to the diagnosis codes not being appropriate according to  Medicare for the test requested. With that being  said the patient cannot have an MRI and therefore this is the only option that we have as far as testing is concerned. The patient has had infection and was on antibiotics and been added code for cellulitis of the bottom to see if this will be appropriate for getting the test approved. Nonetheless I'm concerned about the infection have been spread deeper into the Ischial region. 06/28/18 on evaluation today patient actually appears to be doing rather well all things considered in regard to the right gluteal ulcer. He has been tolerating the dressing changes without complication. With that being said the Wound VAC he states does have to be replaced almost every day or at least reinforced unfortunately. Patient actually has his CT scan later this morning we should have the results by tomorrow. 07/05/18 on evaluation today patient presents for follow-up concerning his right Ischial ulcer. He did see the surgeon Dr. Lysle Pearl last week. They were actually very happy with him and felt like he spent a tremendous amount of time with them as far as discussing his situation was concerned. In the end Dr. Lysle Pearl did contact me as well and determine that he would not recommend any surgical intervention at this point as he felt like it would not be in the patient's best interest based on what he was seeing. He recommended a referral to infectious disease. Subsequently this is something that Dr. Ines Bloomer office is working on setting up for the patient. As far as evaluation today is concerned the patient's wound actually appears to be worse at this point. I am concerned about how things are progressing and specifically about infection. I do not feel like it's the deeper but the area of depth is definitely widening which does have me concerned. No fevers, chills, nausea, or vomiting noted at this time. I think that we do need initiate antibiotic therapy the patient has an allow allergy to amoxicillin/penicillin he states  that he gets a rash since childhood. Nonetheless she's never had the issues with Catholics or cephalosporins in general but he is aware of. 07/27/18 on evaluation today patient presents following admission to the hospital on 07/09/18. He was subsequently discharged on 07/20/18. On 07/15/18 the patient underwent irrigation and debridement was soft tissue biopsy and bone biopsy as well as placement of a Wound VAC in the OR by Dr. Celine Ahr. During the hospital course the patient was placed on a Wound VAC and recommended follow up with surgery in three weeks actually with Dr. Delaine Lame who is infectious disease. The patient was on vancomycin during the hospital course. 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 He also had a bone culture which revealed evidence of methicillin-resistant staph aureus. He is updated CT scan 07/09/18 reveals that he had progression of the which was performed on wound to breakdown down to the trochanter where he actually had irregularities there as well suggestive of osteomyelitis. This was a change just since 9 December when we last performed a CT scan. Obviously this one had gone downhill quite significantly and rapidly. At this point upon evaluation I feel like in general the patient's wound seems to be doing fairly well all things considered upon my evaluation today. Obviously this is larger and deeper than what I previously evaluated but at the same time he seems to be making some progress as far as the appearance of the granulation tissue is concerned. I'm happy in that regard.  No fevers, chills, nausea, or vomiting noted at this time. He is on IV vancomycin and Rocephin at the facility. He is currently in [facility]. 08/03/18 upon evaluation today patient's wound appears to be doing better in regard to the overall appearance at this point in time. Fortunately he's been tolerating the Wound VAC without complication and states that  the facility has been taking excellent care of the wound site. Overall I see some Slough noted on the surface which I am going to attempt sharp debridement today of but nonetheless other than this I feel like he's making progress. 08/09/18 on evaluation today patient's wound appears to be doing much better compared to even last week's evaluation. Do believe that the Wound VAC is been of great benefit for him. He has been tolerating the dressing changes that is the Wound PELHAM, HENNICK. ([id number]) VAC without any complication and he has excellent granulation noted currently. There is no need for sharp debridement at this point. 08/16/18 on evaluation today patient actually appears to be doing very well in regard to the wound in the right gluteal fold region. This is showing signs of progress and again appears to be very healthy which is excellent news. Fortunately there is no sign of active infection by way of odor or drainage at this point. Overall I'm very pleased with how things stand. He seems to be tolerating the Wound VAC without complication. 08/23/18 on evaluation today patient actually appears to be doing better in regard to his wound. He has been tolerating the Wound VAC without complication and in fact it has been collecting a significant amount of drainage which I think is good news especially considering how the wound appears. Fortunately there is no signs of infection at this time definitely nothing appears to be worse which is good news. He has not been started on the Bactrim and Flagyl that was recommended by Dr. Delaine Lame yet. I did actually contact her office this morning in order to check and see were things are that regard their gonna be calling me back. 08/30/18 on evaluation today patient actually appears to show signs of excellent improvement today compared to last evaluation. The undermining is getting much better the wound seems to be feeling quite nicely and I'm very  pleased that the granulation in general. With that being said overall I feel like the patient has made excellent progress which is great news. No fevers, chills, nausea, or vomiting noted at this time. 09/06/18 on evaluation today patient actually appears to be doing rather well in regard to his right gluteal ulcer. This is showing signs of improvement in overall I'm very pleased with how things seem to be progressing. The patient likewise is please. Overall I see no evidence of infection he is about to complete his oral antibiotic regimen which is the end of the antibiotics for him in just about three days. 09/13/18 on evaluation today patient's right Ischial ulcer appears to be showing signs of continued improvement which is excellent news. He's been tolerating the dressing changes without complication. Fortunately there's no signs of infection and the wound that seems to be doing very well. 09/28/18 on evaluation today patient appears to be doing rather well in regard to his right Ischial ulcer. He's been tolerating the Wound VAC without complication he knows there's much less drainage than there used to be this obviously is not a bad thing in my pinion. There's no evidence of infection despite the fact is but nothing about  it now for several weeks. 10/04/18 on evaluation today patient appears to be doing better in regard to his right Ischial wound. He has been tolerating the Wound VAC without complication and I do believe that the silver nitrate last week was beneficial for him. Fortunately overall there's no evidence of active infection at this time which is great news. No fevers, chills, nausea, or vomiting noted at this time. 10/11/18 on evaluation today patient actually appears to be doing rather well in regard to his Ischial ulcer. He's been tolerating the Wound VAC still without complication I feel like this is doing a good job. No fevers, chills, nausea, or vomiting noted at  this time. 11/01/18 on evaluation today patient presents after having not been seen in our clinic for several weeks secondary to the fact that he was on evaluation today patient presents after having not been seen in our clinic for several weeks secondary to the fact that he was in a skilled nursing facility which was on lockdown currently due to the covert 19 national emergency. Subsequently he was discharged from the facility on this past Friday and subsequently made an appointment to come in to see yesterday. Fortunately there's no signs of active infection at this time which is good news and overall he does seem to have made progress since I last saw. Overall I feel like things are progressing quite nicely. The patient is having no pain. 11/08/18 on evaluation today patient appears to be doing okay in regard to his right gluteal ulcer. He has been utilizing a Wound VAC home health this changing this at this point since he's home from the skilled nursing facility. Fortunately there's no signs of obvious active infection at this time. Unfortunately though there's no obvious active infection he is having some maceration and his wife states that when the sheets of the Wound VAC office on Sunday when it broke seal that he ended up having significant issues with some smell as well there concerned about the possibility of infection. Fortunately there's No fevers, chills, nausea, or vomiting noted at this time. 11/15/18 on evaluation today patient actually appears to be doing well in regard to his right gluteal ulcer. He has been tolerating the dressing changes without complication. Specifically the Wound VAC has been utilized up to this point. Fortunately there's no signs of infection and overall I feel like he has made progress even since last week when I last saw him. I'm actually fairly happy with the overall appearance although he does seem to have somewhat of a hyper granular [place]mouth, Tyrik J.  ([id number]) overgrowth in the central portion of the wound which I think may require some sharp debridement to try flatness out possibly utilizing chemical cauterization following. 11/23/18 on evaluation today patient actually appears to be doing very well in regard to his sacral ulcer. He seems to be showing signs of improvement with good granulation. With that being said he still has the small area of hyper granulation right in the central portion of the wound which I'm gonna likely utilize silver nitrate on today. Subsequently he also keeps having a leak at the 6 o'clock location which is unfortunate we may be able to help out with some suggestions to try to prevent this going forward. Fortunately there's no signs of active infection at this time. 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 He's been tolerating the dressing changes  without complication. Fortunately there's no signs of active infection at this time. I've been rather pleased with how things have progressed there still some evidence of pressure getting to the area with some redness right around the immediate wound opening. Nonetheless other than this I'm not seeing any significant complications or issues the wound is somewhat hyper granular. Upon discussing with the patient and his wife today I'm not sure that the wound is being packed to the base with the foam at this point. And if it's not been packed fully that may be part of the reason why is not seen as much improvement as far as the granulation from the base out. Again we do not want pack too tightly but we need some of the firm to get to the base of the wound. I discussed this with patient and his wife today. 12/06/18 on evaluation today patient appears to be doing well in regard to his right gluteal pressure ulcer. He's been tolerating the dressing changes without complication.  Fortunately there's no signs of active infection. He still has some hyper granular tissue and I do think it would be appropriate to continue with the chemical cauterization as of today. 12/16/18 on evaluation today patient actually appears to be doing okay in regard to his right gluteal ulcer. He is been tolerating the dressing changes without complication including the Wound VAC. Overall I feel like nothing seems to be worsening I do feel like that the hyper granulation buds in the central portion of the wound have improved to some degree with the silver nitrate. We will have to see how things continue to progress. 12/20/18 on evaluation today patient actually appears to be doing much worse in my pinion even compared to last week's evaluation. Unfortunately as opposed to showing any signs of improvement the areas of hyper granular tissue in the central portion of the wound seem to be getting worse. Subsequently the wound bed itself also seems to be getting deeper even compared to last week which is both unusual as well as concerning since prior he had been shown signs of improvement. Nonetheless I think that the issue could be that he's actually having some difficulty in issues with a deeper infection. There's no external signs of infection but nonetheless I am more worried about the internal, osteomyelitis, that could be restarting. He has not been on antibiotics for some time at this point. I think that it may be a good idea to go ahead and started back on an antibiotic therapy while we wait to see what the testing shows. 12/27/18 on evaluation today patient presents for follow-up concerning his left gluteal fold wound. Fortunately he appears to be doing well today. I did review the CT scan which was negative for any signs of osteomyelitis or acute abnormality this is excellent news. Overall I feel like the surface of the wound bed appears to be doing significantly better today compared to previously  noted findings. There does not appear any signs of infection nor does he have any pain at this time. 01/03/19 on evaluation today patient actually appears to be doing quite well in regard to his ulcer. Post debridement last week he really did not have too much bleeding which is good news. Fortunately today this seems to be doing some better but we still has some of the hyper granular tissue noted in the base of the wound which is gonna require sharp debridement today as well. Overall I'm pleased with how things seem to  be progressing since we switched away from the Wound VAC I think he is making some progress. 01/10/19 on evaluation today patient appears to be doing better in regard to his right gluteal fold ulcer. He has been tolerating the dressing changes without complication. The debridement to seem to be helping with current away some of the poor hyper granular tissue bugs throughout the region of his gluteal fold wound. He's been tolerating the dressing changes otherwise without complication which is great news. No fevers, chills, nausea, or vomiting noted at this time. 01/17/19 on evaluation today patient actually appears to be doing excellent in regard to his wound. He's been tolerating the dressing changes without complication. Fortunately there is no signs of active infection at this time which is great news. No fevers, chills, nausea, or vomiting noted at this time. 01/24/19 on evaluation today patient actually appears to be doing quite well with regard to his ulcer. He has been tolerating the dressing changes without complication. Fortunately there's no signs of active infection at this time. Overall been very pleased with the progress that he seems to be making currently. 01/31/19-Patient returns at 1 week with apparent similarity in dimensions to the wound, with no signs of infection, he has been JAMIL, PIGEON. ([id number]) changing dressings twice a day 02/08/19 upon evaluation today  patient actually appears to be doing well with regard to his right Ischial ulcer. The wound is not appear to be quite as deep and seems to be making progress which is good news. With that being said I'm still reluctant to go back to the Wound VAC at this point. He's been having to change the dressings twice a day which is a little bit much in my pinion from the wound care supplies standpoint. I think that possibly attempting to utilize extras orbit may be beneficial this may also help to prevent any additional breakdown secondary to fluid retention in the wound itself. The patient is in agreement with giving this a try. 02/15/19 on evaluation today patient actually appears to be doing decently well with regard to his ulcer in the right to gluteal fold location. He's been tolerating the dressing changes without complication. Fortunately there is no signs of active infection at this time. He is able to keep the current dressing in place more effectively for a day at a time whereas before he was having a changes to to three times a day. The actions or has been helpful in this regard. Fortunately there's no signs of anything getting worse and I do feel like he showing signs of good improvement with regard to the wound bed status. 02/22/2019 patient appears to be doing very well today with regard to his ulcer in the gluteal fold. Fortunately there is no signs of active infection and he has been tolerating the dressing changes without any complication. Overall extremely pleased with how things seem to be progressing. He has much less of the hyper granular projections within the wound these have slowly been debrided away and he seems to be doing well. The wound bed is more uniform. 03/01/19 on evaluation today patient appears to be doing unfortunately about the same in regard to his gluteal ulcer. He's been tolerating the dressing changes without complication. Fortunately there's no signs of active infection at  this time. With that being said he continues to develop these hyper granular projections which I'm unsure of exactly what they are and why they are rising. Nonetheless I explained to the patient that I  do believe it would be a good idea for [place] to stand a biopsy sample for pathology to see if that can shed any light on what exactly may be going on here. Fortunately I do not see any obvious signs of infection. With that being said the patient has had a little bit more drainage this week apparently compared to last week. 03/08/2019 on evaluation today patient actually appears to look somewhat better with regard to the appearance of his wound bed at this time. This is good news. Overall I am very pleased with how things seem to have progressed just in the past week with a switch to the [facility] dressing. I think that has been beneficial for him. With that being said at this time the patient is concerned about his biopsy that I sent off last week unfortunately I do not have that report as of yet. Nonetheless we have called to obtain this and hopefully will hear back from the lab later this morning. 03/15/19 on evaluation today patient's wound actually appears to be doing okay today with regard to the overall appearance of the wound bed. He has been tolerating the dressing changes without complication he still has hyper granular tissue noted but fortunately that seems to be minimal at this point compared to some of what we've seen in the past. Nonetheless I do think that he is still having some issues currently with some of this type of granulation the biopsy and since all showed nothing more than just evidence of granulation tissue. Therefore there really is nothing different to initiate or do at this point. 03/24/19 on evaluation today patient appears to be doing a little better with regard to his ulcer. He's been tolerating the dressing changes without complication. Fortunately there is no signs of  active infection at this time. No fevers, chills, nausea, or vomiting noted at this time. I'm overall pleased with how things seem to be progressing. 03/29/2019 on evaluation today patient appears to be doing about the same in regard to his ulcer in the right gluteal fold. Unfortunately he is not seeming to make a lot of progress and the wound is somewhat stalled. There is no signs of active infection externally but I am concerned about the possibility of infection continuing in the ischial location which previously he did have infection noted. Again is not able to have an MRI so probably our best option for testing for this would be a triple phase bone scan which will detect subtle changes in the bone more so than plain film x-rays. In the past they really have not been beneficial and in fact the CT scans even have been somewhat questionable at times. Nonetheless there is no signs of systemic infection which is at least good news but again his wound is not healing at all on the predicted schedule. 04/05/19 on evaluation today patient appears to be doing well all things considering with regard to his wound and the right gluteal fold. He actually has his triple bone scan scheduled for sometime in the next couple of days. With that being said I've also been looking to other possibilities of what could be causing hyper granular tissue were looking into the bone scan again for evaluating for the risk or possibility of infection deeper and I'm also gonna go ahead and see about obtaining a deep tissue culture today to send and see if there's any evidence of infection noted on culture. He's in agreement with that plan. 04/12/2019 on evaluation  today patient presents for reevaluation here in our clinic concerning ongoing issues with his right gluteal fold ulcer. 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 It was discussed with him at that point that I think it would be appropriate JUNIUS, VIEN. ([id number]) for him to proceed with hyperbaric oxygen therapy especially in light of the fact that he is previously been on IV antibiotics at the beginning of the year for close to 3 months followed by several weeks of oral antibiotics that was all prescribed by infectious disease. He had surgical debridement around Christmas December 2019 due to an abscess and osteomyelitis of the ischial bone. Unfortunately this has not really proceeded as well as we would have liked and again we did a CT scan even a couple months ago as he cannot have an MRI secondary to having issues with both a pain pump as well as a spinal cord stimulator which prevent him from going into an MRI machine. With that being said there were chronic changes noted at the ischial tuberosity which had progressed since December 2019 there was no evidence of fluid collection on that initial CT scan. With that being said that was on January 21, 2019. I am not sure that it was a sensitive enough test as compared to an MRI and then subsequently I ordered a bone scan for him which was actually completed on 03/29/2019 and this revealed that he does indeed have positive osteomyelitis involving the right ischial tuberosity. This is adjacent to the ulcer and I think is the reason that his ulcer is not healing. Subsequently I am in a place him back on oral antibiotics today unfortunately his wound culture showed just mixed gram-negative flora with no specific findings of a predominant organism. Nonetheless being with the location I think a good broad-spectrum antibiotic for gram-negative's is a good choice at this point he is previously taken Levaquin without significant issues and I think that is an appropriate antibiotic for him at this time. 04/19/2019 on evaluation today patient actually appears to be doing fairly well with regard to his  wound. He has been tolerating the dressing changes without complication. Fortunately there is no signs of active infection and he has been taking the oral antibiotics at this time. Subsequently we did make a referral to infectious disease although Dr. Steva Ready wants the patient to be seen by general surgery first in order apparently to see if there is anything from a surgical standpoint that should be done prior to initiation of IV antibiotic therapy. Again the patient is okay with actually seeing Dr. Celine Ahr whom he has seen before we will make that referral. 04/26/2019 on evaluation today patient actually appears to be doing well with regard to his wound. He has been tolerating the dressing changes without complication. Fortunately there is no signs of active infection at this time. I do believe that the hyperbaric oxygen therapy along with the antibiotics which I prescribed at this point have been doing well for him. With that being said he has not seen Dr. Celine Ahr the surgeon yet in fact they have not been contacted for scheduling an appointment as of yet. Subsequently the patient is not on antibiotics currently by IV Dr. Steva Ready did want him to see Dr. Celine Ahr first which we are working on trying to get scheduled. We again give the information to the patient today for  Dr. Celine Ahr and her number as far as contacting their office to see about getting something scheduled. Again were looking for whether or not they recommend any surgical intervention. 05/03/2019 on evaluation today patient appears to be doing about the same with regard to his wound there may be a little bit of filling in in regard to the base of the wound but still he has quite a bit of hyper granular tissue that I am seeing today. I believe that he may may need a more aggressive sharp debridement possibly even by the surgeon or under anesthesia in order to clear away some of the hyper granular material in order to help allow for  appropriate granulation to fill in. We have made a referral to Dr. Celine Ahr unfortunately it sounds as if they did not receive the referral we contacted them today they should be get in touch with the patient. Depending on how things show from that standpoint the patient may need to see Dr. Ardyth Gal who is the infectious disease specialist although he really does not want a PICC line again. No fevers, chills, nausea, vomiting, or diarrhea. He is tolerating hyperbaric oxygen therapy very well. 05/12/2019 on evaluation today patient presents for follow-up visit concerning his right gluteal fold ulcer. He did see Dr. Celine Ahr the surgeon who previously evaluated his wound and she actually felt like he was doing quite well with regard to the wound based on what she was seen. He does seem to be responding to some degree with the oral antibiotics along with hyperbaric oxygen therapy at this point she did not see any evidence or need for further surgical intervention at this point. She recommended deferring additional or ongoing antibiotic therapy to Dr. Steva Ready at her discretion. Fortunately there is no signs of active systemic infection. No fevers, chills, nausea, vomiting, or diarrhea. 10/29; patient I am seeing really for the first time today in terms of his wound. 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 He is being treated with hyperbaric oxygen for underlying osteomyelitis most recently documented by a three-phase bone scan. He has been on Levaquin for roughly a month and he is out of these and asked me for a refill. Most recent cultures were negative although I do not think these were bone cultures. He has a very irregular surface to this wound which is almost nodular in texture. It undermines superiorly with the same nodular hyper granulated surface. I see that he was referred to general surgery for an operative debridement although the surgeon did not agree with this I  think that would have been the proper course of action in this. He has been using Hydrofera Blue. He is supposed to start a wound VAC tomorrow which is actually a reapplication of wound VAC. I think mostly last time they had trouble keeping the seal in place I hope we have better look at this this time. 05/24/2019 on evaluation today I did review patient's note from Dr. Dellia Nims where he was seen last week when I was out of the office. Subsequently it does appear that Dr. Dellia Nims was in agreement that the patient really needed debridement to clear away some of the nodular tissue in the base of the wound. The good news is he has the wound VAC initiated and some of this tissue is already starting to breakdown under the treatment of the wound VAC again because it is not really viable. JUVENTINO, GOHMAN ([id number]) Nonetheless I do believe that  we can likely perform some debridement today to clear this away and hopefully the continuation of the wound VAC along with hyperbarics and antibiotic therapy will be beneficial in stopping this from reoccurring. If we get better control in the wound bed in a good spot I think we can definitely use the PriMatrix which we actually did get approval for. Electronic Signature(s) Signed: 05/26/2019 8:18:25 AM By: Worthy Keeler [name] Entered By: Worthy Keeler on 05/26/2019 [id number] VINICIO, DEYETTE ([id number]) -------------------------------------------------------------------------------- Physical Exam Details Patient Name: [name] Date of Service: 05/24/2019 10:30 AM Medical Record Number: [id number] Patient Account Number: [id number] Date of Birth/Sex: 19-Mar-1951 (68 y.o. M) Treating RN: Montey Hora Primary Care Provider: [name] Other Clinician: Referring Provider: [name] Treating Provider/Extender: [name], HOYT Weeks in Treatment: 32 Constitutional Well-nourished and well-hydrated in no acute  distress. Respiratory normal breathing without difficulty. clear to auscultation bilaterally. Cardiovascular regular rate and rhythm with normal S1, S2. Psychiatric this patient is able to make decisions and demonstrates good insight into disease process. Alert and Oriented x 3. pleasant and cooperative. Notes Patient's wound bed upon evaluation today actually showed signs of nodular poor granulation areas along with some better granulation areas. To be honest I feel like that he is making slow progress towards improvement but I do feel like things look better today than they did last time I saw him. I do believe the wound VAC has been beneficial in this regard as well. Overall I think that is just can be a matter of time for [place] to get this under control. I did perform sharp debridement today to remove some of the nodular hyper granular areas that were poor for healing and the patient tolerated this without complication he did have some bleeding pressure was applied and controlled the bleeding however. Electronic Signature(s) Signed: 05/26/2019 8:19:24 AM By: Worthy Keeler [name] Entered By: Worthy Keeler on 05/26/2019 [id number] DUB, SOLTANI ([id number]) -------------------------------------------------------------------------------- Physician Orders Details Patient Name: [name] Date of Service: 05/24/2019 10:30 AM Medical Record Number: [id number] Patient Account Number: [id number] Date of Birth/Sex: 05-05-1951 (68 y.o. 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 - in office o Cleanse wound with mild soap and water Skin Barriers/Peri-Wound Care o Skin Prep Primary Wound Dressing Wound #1 Right Gluteal fold o Silver Collagen - under NPWT Dressing Change Frequency Wound #1 Right Gluteal fold o Change Dressing Monday, Wednesday, Friday Follow-up Appointments Wound #1 Right Gluteal fold o Return Appointment in 1 week. Off-Loading Wound #1 Right Gluteal fold o Turn and reposition every 2 hours Home Health Wound #1 Right Gluteal fold o [place] Nurse may visit PRN to address patientos wound care needs. o FACE TO FACE ENCOUNTER: MEDICARE and MEDICAID PATIENTS: I certify that this patient is under my care and that I had a face-to-face encounter that meets the physician face-to-face encounter requirements with this patient on this date. The encounter with the patient was in whole or in part for the following MEDICAL  CONDITION: (primary reason for Home Healthcare) MEDICAL NECESSITY: I certify, that based on my findings, AZZAM, ELSAESSER ([id number]) NURSING services are a medically necessary home health service. HOME BOUND STATUS: I certify that my clinical findings support that this patient is homebound (i.e., Due to illness or injury, pt requires aid of supportive devices such as crutches, cane, wheelchairs, walkers, the use of special transportation or the assistance of another person to leave their place of residence. There is a normal inability to leave the home and doing so requires considerable and taxing effort. Other absences are for medical reasons / religious services and are infrequent or of short duration when for other reasons). o If current dressing causes regression in wound condition, may D/C ordered  dressing product/s and apply Normal Saline Moist Dressing daily until next [place] / Other MD appointment. [place] of regression in wound condition at [phone]. o Please direct any NON-WOUND related issues/requests for orders to patient's Primary Care Physician Negative Pressure Wound Therapy Wound #1 Right Gluteal fold o Wound VAC settings at 125/130 mmHg continuous pressure. Use BLACK/GREEN foam to wound cavity. Use WHITE foam to fill any tunnel/s and/or undermining. Change VAC dressing 3 X WEEK. Change canister as indicated when full. Nurse may titrate settings and frequency of dressing changes as clinically indicated. o Home Health Nurse may d/c VAC for s/s of increased infection, significant wound regression, or uncontrolled drainage. [place] at [phone]. o Apply contact layer over base of wound. - apply prisma/silver collagen to the base of the wound o Number of foam/gauze pieces used in the dressing = o Place NPWT on HOLD. Notes In clinic today Dakins moistened gauze, xtrasorb and tegaderm Electronic Signature(s) Signed: 05/24/2019 4:57:37 PM By: Montey Hora Signed: 05/26/2019 6:06:55 PM By: Worthy Keeler [name] Entered By: Montey Hora on 05/24/2019 11:00:17 STEPHAN, STRANZ ([id number]) -------------------------------------------------------------------------------- Problem List Details Patient Name: [name] Date of Service: 05/24/2019 10:30 AM Medical Record Number: [id number] Patient Account Number: [id number] Date of Birth/Sex: [birth year] (68 y.o. M) Treating RN: Montey Hora Primary Care Provider: [name] Other Clinician: Referring Provider: [name] Treating Provider/Extender: [name], HOYT Weeks in Treatment: 76 Active Problems ICD-10 Evaluated Encounter Code Description Active Date Today Diagnosis L89.314 Pressure ulcer of right buttock, stage 4 11/30/2017  No Yes M86.68 Other chronic osteomyelitis, other site 04/08/2019 No Yes L03.317 Cellulitis of buttock 06/21/2018 No Yes G82.20 Paraplegia, unspecified 11/30/2017 No Yes S34.109S Unspecified injury to unspecified level of lumbar spinal cord, 11/30/2017 No Yes sequela I10 Essential (primary) hypertension 11/30/2017 No Yes Inactive Problems Resolved Problems Electronic Signature(s) Signed: 05/24/2019 10:26:17 AM By: Worthy Keeler [name] Entered By: Worthy Keeler on 05/24/2019 10:26:17 ELIC, ALBERTI ([id number]) -------------------------------------------------------------------------------- Progress Note Details Patient Name: [name] Date of Service: 05/24/2019 10:30 AM Medical Record Number: [id number] Patient Account Number: [id number] Date of Birth/Sex: [birth year] (68 y.o. 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 He states that there is no history of diabetes that he is aware of.  He does have issues with his prostate and is currently receiving treatment for this by way of oral medication. With that being said I do not have a lot of details in that regard. Nonetheless the patient presents today as a result of having been referred to [place] by another provider initially home health was set to come out and take care of his wound although due to the fact that he apparently drives he's not able to receive home health. His wife is therefore trying to help take  care of this wound within although they have been struggling with what exactly to do at this point. She states that she can do some things but she is definitely not a nurse and does have some issues with looking at blood. The good news is the wound does not appear to be too deep and is fairly superficial at this point. There is no slough noted there is some nonviable skin noted around the surface of the wound and the perimeter at this point. The central portion of the wound appears to be very good with a dermal layer noted this does not appear to be again deep enough to extend it to subcutaneous tissue at this point. 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 In the pump he has Dilaudid and baclofen. 12/07/17 on evaluation today patient presents for follow-up concerning his ongoing lower back thigh ulcer on the right. He states that he did not get the supplies ordered and therefore has not really been able to perform the dressing changes as directed exactly. His wife was able to get some Boarder Foam Dressing's from the drugstore and subsequently has been using hydrogel which did help to a degree in the wound does appear to be able smaller. There is actually more drainage this week noted than previous. 12/21/17 on evaluation today patient appears to be doing rather well in regard to his right gluteal ulcer. He has been tolerating the dressing changes without complication. There does not appear to be any evidence of infection at this point in time. Overall the wound does seem to be making some progress as far as the edges are concerned there's not as much in the way of overlapping of the external wound edges and he has a good epithelium to wound bed border for the most part. This however is not true right at the 12 o'clock location over the span of a little over a centimeters which actually will  require debridement today to clean this away and hopefully allow it to continue to heal more appropriately. 12/28/17 on evaluation today patient appears to be doing rather well in regard to his ulcer in the left gluteal region. He's been tolerating the dressing changes without complication. Apparently he has had some difficulty getting his dressing material. Apparently there's been some confusion with ordering we're gonna check into this. Nonetheless overall he's been showing signs of improvement which is good news. Debridement is not required today. 01/04/18 on evaluation today patient presents for follow-up concerning his right gluteal ulcer. He has been tolerating the dressing changes fairly well. On inspection today it appears he may actually have some maceration them concerned about the fact that he may be developing too much moisture in and around the wound bed which can cause delay in healing. With that being said he unfortunately really has not showed significant signs of improvement since last  week's evaluation in fact this may even be just the little bit/slightly larger. Nonetheless he's been having a lot of discomfort I'm not sure this is even related to the wound as he has no pain when I'm to breeding or otherwise cleaning the wound during evaluation today. Nonetheless this is something that we did recommend he talked to his pain specialist concerning. 01/11/18 on evaluation today patient appears to be doing better in regard to his ulceration. He has been tolerating the dressing Sienkiewicz, Wylder J. ([id number]) changes without complication. With that being said overall there's no evidence of infection which is good news. The only thing is he did receive the hatch affair blue classic versus the ready nonetheless I feel like this is perfectly fine and appears to have done well for him over the past week. 01/25/18 on evaluation today patient's wound actually appears to be a little bit larger  than during the last evaluation. The good news is the majority of the wound edges actually appear to be fairly firmly attached to the wound bed unfortunately again we're not really making progress in regard to the size. Roughly the wound is about the same size as when I first saw him although again the wound margin/edges appear to be much better. 02/01/18 on evaluation today patient actually appears to be doing very well in regard to his wound. Applying the Prisma dry does seem to be better although he does still have issues with slow progression of the wound. There was a slight improvement compared to last week's measurements today. Nonetheless I have been considering other options as far as the possibility of Theraskin or even a snap vac. In general I'm not sure that the Theraskin due to location of the wound would be a very good idea. Nonetheless I do think that a snap vac could be a possibility for the patient and in fact I think this could even be an excellent way to manage the wound possibly seeing some improvement in a very rapid fashion here. Nonetheless this is something that we would need to get approved and I did have a lengthy conversation with the patient about this today. 02/08/18 on evaluation today patient appears to be doing a little better in regard to his ulcer. He has been tolerating the dressing changes without complication. Fortunately despite the fact that the wound is a little bit smaller it's not significantly so unfortunately. We have discussed the possibility of a snap vac we did check with insurance this is actually covered at this point. Fortunately there does not appear to be any sign of infection. Overall I'm fairly pleased with how things seem to be appearing at this point. 02/15/18 on evaluation today patient appears to be doing rather well in regard to his right gluteal ulcer. Unfortunately the snap vac did not stay in place with his sheer and friction this came loose  and did not seem to maintain seal very well. He worked for about two days and it did seem to do very well during that time according to his wife but in general this does not seem to be something that's gonna be beneficial for him long-term. I do believe we need to go back to standard dressings to see if we can find something that will be of benefit. 03/02/18- He is here in follow up evaluation; there is minimal change in the wound. He will continue with the same treatment plan, would consider changing to iodosrob/iodoflex if ulcer continues to to plateau. He  will follow up next week 03/08/18 on evaluation today patient's wound actually appears to be about the same size as when I previously saw him several weeks back. Unfortunately he does have some slightly dark discoloration in the central portion of the wound which has me concerned about pressure injury. I do believe he may be sitting for too long a period of time in fact he tells me that "I probably sit for much too long". He does have some Slough noted on the surface of the wound and again as far as the size of the wound is concerned I'm really not seeing anything that seems to have improved significantly. 03/15/18 on evaluation today patient appears to be doing fairly well in regard to his ulcer. The wound measured pretty much about the same today compared to last week's evaluation when looking at his graph. With that being said the area of bruising/deep tissue injury that was noted last week I do not see at this point. He did get a new cushion fortunately this does seem to be have been of benefit in my pinion. It does appear that he's been off of this more which is good news as well I think that is definitely showing in the overall wound measurements. With that being said I do believe that he needs to continue to offload I don't think that the fact this is doing better should be or is going to allow him to not have to offload and explain this  to him as well. Overall he seems to be in agreement the plan I think he understands. The overall appearance of the wound bed is improved compared to last week I think the Iodoflex has been beneficial in that regard. 03/29/18 on evaluation today patient actually appears to be doing rather well in regard to his wound from the overall appearance standpoint he does have some granulation although there's some Slough on the surface of the wound noted as well. With that being said he unfortunately has not improved in regard to the overall measurement of the wound in volume or in size. I did have a discussion with him very specifically about offloading today. He actually does work although he mainly is just sitting throughout the day. He tells me he offloads by "lifting himself up for 30 seconds off of his chair occasionally" purchase from [employer] which does seem to have helped. And he has a new cushion that he with that being said he's also able to stand some for a very short period of time but not significant enough I think to provide appropriate offloading. I think the biggest issue at this point with the wound and the fact is not healing as quickly as we would like is due to the fact that he is really not able to appropriately offload while at work. He states the beginning after his injury he actually had a bed at his job that he could lay on in order to offload and that does seem to have been of help back at that time. Nonetheless he had not done this in quite some time unfortunately. I think that could be helpful for him this is something I would like for him to look into. LINNIE, MCCLAY ([id number]) 04/05/18 on evaluation today patient actually presents for follow-up concerning his right gluteal ulcer. Again he really is not significantly improved even compared to last week. He has been tolerating the dressing changes without complication. With that being said fortunately there appears  to be no evidence of infection at this time. He has been more proactive in trying to offload. 04/12/18 on evaluation today patient actually appears to be doing a little better in regard to his wound and the right gluteal fold region. He's been tolerating the dressing changes since removing the oasis without complication. However he was having a lot of burning initially with the oasis in place. He's unsure of exactly why this was given so much discomfort but he assumes that it was the oasis itself causing the problem. Nonetheless this had to be removed after about three days in place although even those three days seem to have made a fairly good improvement in regard to the overall appearance of the wound bed. In fact is the first time that he's made any improvement from the standpoint of measurements in about six weeks. He continues to have no discomfort over the area of the wound itself which leads me to wonder why he was having the burning with the oasis when he does not even feel the actual debridement's themselves. I am somewhat perplexed by this. 04/19/18 on evaluation today patient's wound actually appears to be showing signs of epithelialization around the edge of the wound and in general actually appears to be doing better which is good news. He did have the same burning after about three days with applying the Endoform last week in the same fashion that I would generally apply a skin substitute. This seems to indicate that it's not the oasis to cause the problem but potentially the moisture buildup that just causes things to burn or there may be some other reaction with the skin prep or Steri-Strips. Nonetheless I'm not sure that is gonna be able to tolerate any skin substitute for a long period of time. The good news is the wound actually appears to be doing better today compared to last week and does seem to finally be making some progress. 04/26/18 on evaluation today patient actually  appears to be doing rather well in regard to his ulcer in the right gluteal fold. He has been tolerating the dressing changes without complication which is good news. The Endoform does seem to be helping although he was a little bit more macerated this week. This seems to be an ongoing issue with fluid control at this point. Nonetheless I think we may be able to add something like Drawtex to help control the drainage. 05/03/18 on evaluation today patient appears to actually be doing better in regard to the overall appearance of his wound. He has been tolerating the dressing changes without complication. Fortunately there appears to be no evidence of infection at this time. I really feel like his wound has shown signs as of today of turning around last week I thought so as well and definitely he could be seen in this week's overall appearance and measurements. In general I'm very pleased with the fact that he finally seems to be making a steady but sure progress. The patient likewise is very pleased. 05/17/18 on evaluation today patient appears to be doing more poorly unfortunately in regard to his ulcer. He has been tolerating the dressing changes without complication. With that being said he tells me that in the past couple of days he and his wife have noticed that we did not seem to be doing quite as well is getting dark near the center. Subsequently upon evaluation today the wound actually does appear to be doing worse compared to previous. He has been  tolerating the dressing changes otherwise and he states that he is not been sitting up anymore than he was in the past from what he tells me. Still he has continued to work he states "I'm tired of dealing with this and if I have to just go home and lay in the bed all the time that's what I'll do". Nonetheless I am concerned about the fact that this wound does appear to be deeper than what it was previous. 05/24/18 upon evaluation today patient  actually presents after having been in the hospital due to what was presumed to be sepsis secondary to the wound infection. He had an elevated white blood cell count between 14 and 15. With that being said he does seem to be doing somewhat better now. His wound still is giving him some trouble nonetheless and he is obviously concerned about the fact likely talked about that this does seem to go more deeply than previously noted. I did review his wound culture which showed evidence of Staphylococcus aureus him and group B strep. Nonetheless he is on antibiotics, Levaquin, for this. Subsequently I did review his intake summary from the hospital as well. I also did look at the CT of the lumbar spine with contrast that was performed which showed no bone destruction to suggest lumbar disguises/osteomyelitis or sacral osteomyelitis. There was no paraspinal abscess. Nonetheless it appears this may have been more of just a soft tissue infection at this point which is good news. He still is nonetheless concerned about the wound which again I think is completely reasonable considering everything he's been through recently. 05/31/18 on evaluation today on evaluation today patient actually appears to be showing signs of his wound be a little bit deeper than what I would like to see. Fortunately he does not show any signs of significant infection although his temperature was 99 today he states he's been checking this at home and has not been elevated. Nonetheless with the undermining that I'm seeing at this point I am becoming more concerned about the wound I do think that offloading is a key factor here that is preventing the speedy recovery at this point. There does not appear to be any evidence of again over infection noted. He's been using Santyl currently. RYCE, VARDEMAN ([id number]) 06/07/18 the patient presents today for follow-up evaluation regarding the left ulcer in the gluteal region. He has  been tolerating the Wound VAC fairly well. He is obviously very frustrated with this he states that to mean is really getting in his way. There does not appear to be any evidence of infection at this time he does have a little bit of odor I do not necessarily associate this with infection just something that we sometimes notice with Wound VAC therapy. With that being said I can definitely catch a tone of discontentment overall in the patient's demeanor today. This when he was previously in the hospital an CT scan was done of the lumbar region which did not reveal any signs of osteomyelitis. With that being said the pelvis in particular was not evaluated distinctly which means he could still have some osteonecrosis I. Nonetheless the Wound VAC was started on Thursday I do want to get this little bit more time before jumping to a CT scan of the pelvis although that is something that I might would recommend if were not see an improvement by that time. 06/14/18 on evaluation today patient actually appears to be doing about the same in regard  to his right gluteal ulcer. Again he did have a CT scan of the lumbar spine unfortunately this did not include the pelvis. Nonetheless with the depth of the wound that I'm seeing today even despite the fact that I'm not seeing any evidence of overt cellulitis I believe there's a good chance that we may be dealing with osteomyelitis somewhere in the right Ischial region. No fevers, chills, nausea, or vomiting noted at this time. 06/21/18 on evaluation today patient actually appears to be doing about the same with regard to his wound. The tunnel at 6 o'clock really does not appear to be any deeper although it is a little bit wider. I think at this point you may want to start packing this with white phone. Unfortunately I have not got approval for the CT scan of the pelvis as of yet due to the fact that Medicare apparently has been denied it due to the diagnosis codes  not being appropriate according to Medicare for the test requested. With that being said the patient cannot have an MRI and therefore this is the only option that we have as far as testing is concerned. The patient has had infection and was on antibiotics and been added code for cellulitis of the bottom to see if this will be appropriate for getting the test approved. Nonetheless I'm concerned about the infection have been spread deeper into the Ischial region. 06/28/18 on evaluation today patient actually appears to be doing rather well all things considered in regard to the right gluteal ulcer. He has been tolerating the dressing changes without complication. With that being said the Wound VAC he states does have to be replaced almost every day or at least reinforced unfortunately. Patient actually has his CT scan later this morning we should have the results by tomorrow. 07/05/18 on evaluation today patient presents for follow-up concerning his right Ischial ulcer. He did see the surgeon Dr. Lysle Pearl last week. They were actually very happy with him and felt like he spent a tremendous amount of time with them as far as discussing his situation was concerned. In the end Dr. Lysle Pearl did contact me as well and determine that he would not recommend any surgical intervention at this point as he felt like it would not be in the patient's best interest based on what he was seeing. He recommended a referral to infectious disease. Subsequently this is something that Dr. Ines Bloomer office is working on setting up for the patient. As far as evaluation today is concerned the patient's wound actually appears to be worse at this point. I am concerned about how things are progressing and specifically about infection. I do not feel like it's the deeper but the area of depth is definitely widening which does have me concerned. No fevers, chills, nausea, or vomiting noted at this time. I think that we do need initiate  antibiotic therapy the patient has an allow allergy to amoxicillin/penicillin he states that he gets a rash since childhood. Nonetheless she's never had the issues with Catholics or cephalosporins in general but he is aware of. 07/27/18 on evaluation today patient presents following admission to the hospital on 07/09/18. He was subsequently discharged on 07/20/18. On 07/15/18 the patient underwent irrigation and debridement was soft tissue biopsy and bone biopsy as well as placement of a Wound VAC in the OR by Dr. Celine Ahr. During the hospital course the patient was placed on a Wound VAC and recommended follow up with surgery in three weeks actually  with Dr. Delaine Lame who is infectious disease. The patient was on vancomycin during the hospital course. 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 He also had a bone culture which revealed evidence of methicillin-resistant staph aureus. He is updated CT scan 07/09/18 reveals that he had progression of the which was performed on wound to breakdown down to the trochanter where he actually had irregularities there as well suggestive of osteomyelitis. This was a change just since 9 December when we last performed a CT scan. Obviously this one had gone downhill quite significantly and rapidly. At this point upon evaluation I feel like in general the patient's wound seems to be doing fairly well all things considered upon my evaluation today. Obviously this is larger and deeper than what I previously evaluated but at the same time he seems to be making some progress as far as the appearance of the granulation tissue is concerned. I'm happy in that regard. No fevers, chills, nausea, or vomiting noted at this time. He is on IV vancomycin and Rocephin at the facility. He is currently in [facility]. 08/03/18 upon evaluation today patient's wound appears to be doing better in regard to the overall appearance at this point  in time. Fortunately he's been tolerating the Wound VAC without complication and states that the facility has been taking REYNEL, BABAYAN. ([id number]) excellent care of the wound site. Overall I see some Slough noted on the surface which I am going to attempt sharp debridement today of but nonetheless other than this I feel like he's making progress. 08/09/18 on evaluation today patient's wound appears to be doing much better compared to even last week's evaluation. Do believe that the Wound VAC is been of great benefit for him. He has been tolerating the dressing changes that is the Wound VAC without any complication and he has excellent granulation noted currently. There is no need for sharp debridement at this point. 08/16/18 on evaluation today patient actually appears to be doing very well in regard to the wound in the right gluteal fold region. This is showing signs of progress and again appears to be very healthy which is excellent news. Fortunately there is no sign of active infection by way of odor or drainage at this point. Overall I'm very pleased with how things stand. He seems to be tolerating the Wound VAC without complication. 08/23/18 on evaluation today patient actually appears to be doing better in regard to his wound. He has been tolerating the Wound VAC without complication and in fact it has been collecting a significant amount of drainage which I think is good news especially considering how the wound appears. Fortunately there is no signs of infection at this time definitely nothing appears to be worse which is good news. He has not been started on the Bactrim and Flagyl that was recommended by Dr. Delaine Lame yet. I did actually contact her office this morning in order to check and see were things are that regard their gonna be calling me back. 08/30/18 on evaluation today patient actually appears to show signs of excellent improvement today compared to last evaluation. The  undermining is getting much better the wound seems to be feeling quite nicely and I'm very pleased that the granulation in general. With that being said overall I feel like the patient has made excellent progress which is great news. No fevers, chills, nausea, or vomiting noted at this time. 09/06/18 on evaluation today patient actually appears to be  doing rather well in regard to his right gluteal ulcer. This is showing signs of improvement in overall I'm very pleased with how things seem to be progressing. The patient likewise is please. Overall I see no evidence of infection he is about to complete his oral antibiotic regimen which is the end of the antibiotics for him in just about three days. 09/13/18 on evaluation today patient's right Ischial ulcer appears to be showing signs of continued improvement which is excellent news. He's been tolerating the dressing changes without complication. Fortunately there's no signs of infection and the wound that seems to be doing very well. 09/28/18 on evaluation today patient appears to be doing rather well in regard to his right Ischial ulcer. He's been tolerating the Wound VAC without complication he knows there's much less drainage than there used to be this obviously is not a bad thing in my pinion. There's no evidence of infection despite the fact is but nothing about it now for several weeks. 10/04/18 on evaluation today patient appears to be doing better in regard to his right Ischial wound. He has been tolerating the Wound VAC without complication and I do believe that the silver nitrate last week was beneficial for him. Fortunately overall there's no evidence of active infection at this time which is great news. No fevers, chills, nausea, or vomiting noted at this time. 10/11/18 on evaluation today patient actually appears to be doing rather well in regard to his Ischial ulcer. He's been tolerating the Wound VAC still without complication I feel like  this is doing a good job. No fevers, chills, nausea, or vomiting noted at this time. 11/01/18 on evaluation today patient presents after having not been seen in our clinic for several weeks secondary to the fact that he was on evaluation today patient presents after having not been seen in our clinic for several weeks secondary to the fact that he was in a skilled nursing facility which was on lockdown currently due to the covert 19 national emergency. Subsequently he was discharged from the facility on this past Friday and subsequently made an appointment to come in to see yesterday. Fortunately there's no signs of active infection at this time which is good news and overall he does seem to have made progress since I last saw. Overall I feel like things are progressing quite nicely. The patient is having no pain. 11/08/18 on evaluation today patient appears to be doing okay in regard to his right gluteal ulcer. He has been utilizing a Wound VAC home health this changing this at this point since he's home from the skilled nursing facility. Fortunately there's no signs of obvious active infection at this time. Unfortunately though there's no obvious active infection he is having some maceration and his wife states that when the sheets of the Wound VAC office on Sunday when it broke seal that he ended up having significant issues with some smell as well there concerned about the possibility of infection. Fortunately there's No fevers, chills, nausea, or vomiting noted at this time. RIZWAN, MAMMONE ([id number]) 11/15/18 on evaluation today patient actually appears to be doing well in regard to his right gluteal ulcer. He has been tolerating the dressing changes without complication. Specifically the Wound VAC has been utilized up to this point. Fortunately there's no signs of infection and overall I feel like he has made progress even since last week when I last saw him. I'm actually fairly happy  with the overall appearance although  he does seem to have somewhat of a hyper granular overgrowth in the central portion of the wound which I think may require some sharp debridement to try flatness out possibly utilizing chemical cauterization following. 11/23/18 on evaluation today patient actually appears to be doing very well in regard to his sacral ulcer. He seems to be showing signs of improvement with good granulation. With that being said he still has the small area of hyper granulation right in the central portion of the wound which I'm gonna likely utilize silver nitrate on today. Subsequently he also keeps having a leak at the 6 o'clock location which is unfortunate we may be able to help out with some suggestions to try to prevent this going forward. Fortunately there's no signs of active infection at this time. 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 He's been tolerating the dressing changes without complication. Fortunately there's no signs of active infection at this time. I've been rather pleased with how things have progressed there still some evidence of pressure getting to the area with some redness right around the immediate wound opening. Nonetheless other than this I'm not seeing any significant complications or issues the wound is somewhat hyper granular. Upon discussing with the patient and his wife today I'm not sure that the wound is being packed to the base with the foam at this point. And if it's not been packed fully that may be part of the reason why is not seen as much improvement as far as the granulation from the base out. Again we do not want pack too tightly but we need some of the firm to get to the base of the wound. I discussed this with patient and his wife today. 12/06/18 on evaluation today patient appears to be doing well in regard to his right gluteal pressure  ulcer. He's been tolerating the dressing changes without complication. Fortunately there's no signs of active infection. He still has some hyper granular tissue and I do think it would be appropriate to continue with the chemical cauterization as of today. 12/16/18 on evaluation today patient actually appears to be doing okay in regard to his right gluteal ulcer. He is been tolerating the dressing changes without complication including the Wound VAC. Overall I feel like nothing seems to be worsening I do feel like that the hyper granulation buds in the central portion of the wound have improved to some degree with the silver nitrate. We will have to see how things continue to progress. 12/20/18 on evaluation today patient actually appears to be doing much worse in my pinion even compared to last week's evaluation. Unfortunately as opposed to showing any signs of improvement the areas of hyper granular tissue in the central portion of the wound seem to be getting worse. Subsequently the wound bed itself also seems to be getting deeper even compared to last week which is both unusual as well as concerning since prior he had been shown signs of improvement. Nonetheless I think that the issue could be that he's actually having some difficulty in issues with a deeper infection. There's no external signs of infection but nonetheless I am more worried about the internal, osteomyelitis, that could be restarting. He has not been on antibiotics for some time at this point. I think that it may be a good idea to go ahead and started back on an antibiotic therapy while we  wait to see what the testing shows. 12/27/18 on evaluation today patient presents for follow-up concerning his left gluteal fold wound. Fortunately he appears to be doing well today. I did review the CT scan which was negative for any signs of osteomyelitis or acute abnormality this is excellent news. Overall I feel like the surface of the wound bed  appears to be doing significantly better today compared to previously noted findings. There does not appear any signs of infection nor does he have any pain at this time. 01/03/19 on evaluation today patient actually appears to be doing quite well in regard to his ulcer. Post debridement last week he really did not have too much bleeding which is good news. Fortunately today this seems to be doing some better but we still has some of the hyper granular tissue noted in the base of the wound which is gonna require sharp debridement today as well. Overall I'm pleased with how things seem to be progressing since we switched away from the Wound VAC I think he is making some progress. 01/10/19 on evaluation today patient appears to be doing better in regard to his right gluteal fold ulcer. He has been tolerating the dressing changes without complication. The debridement to seem to be helping with current away some of the poor hyper granular tissue bugs throughout the region of his gluteal fold wound. He's been tolerating the dressing changes otherwise without complication which is great news. No fevers, chills, nausea, or vomiting noted at this time. 01/17/19 on evaluation today patient actually appears to be doing excellent in regard to his wound. He's been tolerating the dressing changes without complication. Fortunately there is no signs of active infection at this time which is great news. No fevers, chills, nausea, or vomiting noted at this time. LEANARD, ISHEE ([id number]) 01/24/19 on evaluation today patient actually appears to be doing quite well with regard to his ulcer. He has been tolerating the dressing changes without complication. Fortunately there's no signs of active infection at this time. Overall been very pleased with the progress that he seems to be making currently. 01/31/19-Patient returns at 1 week with apparent similarity in dimensions to the wound, with no signs of infection, he  has been changing dressings twice a day 02/08/19 upon evaluation today patient actually appears to be doing well with regard to his right Ischial ulcer. The wound is not appear to be quite as deep and seems to be making progress which is good news. With that being said I'm still reluctant to go back to the Wound VAC at this point. He's been having to change the dressings twice a day which is a little bit much in my pinion from the wound care supplies standpoint. I think that possibly attempting to utilize extras orbit may be beneficial this may also help to prevent any additional breakdown secondary to fluid retention in the wound itself. The patient is in agreement with giving this a try. 02/15/19 on evaluation today patient actually appears to be doing decently well with regard to his ulcer in the right to gluteal fold location. He's been tolerating the dressing changes without complication. Fortunately there is no signs of active infection at this time. He is able to keep the current dressing in place more effectively for a day at a time whereas before he was having a changes to to three times a day. The actions or has been helpful in this regard. Fortunately there's no signs of anything getting worse and  I do feel like he showing signs of good improvement with regard to the wound bed status. 02/22/2019 patient appears to be doing very well today with regard to his ulcer in the gluteal fold. Fortunately there is no signs of active infection and he has been tolerating the dressing changes without any complication. Overall extremely pleased with how things seem to be progressing. He has much less of the hyper granular projections within the wound these have slowly been debrided away and he seems to be doing well. The wound bed is more uniform. 03/01/19 on evaluation today patient appears to be doing unfortunately about the same in regard to his gluteal ulcer. He's been tolerating the dressing changes  without complication. Fortunately there's no signs of active infection at this time. With that being said he continues to develop these hyper granular projections which I'm unsure of exactly what they are and why they are rising. Nonetheless I explained to the patient that I do believe it would be a good idea for [place] to stand a biopsy sample for pathology to see if that can shed any light on what exactly may be going on here. Fortunately I do not see any obvious signs of infection. With that being said the patient has had a little bit more drainage this week apparently compared to last week. 03/08/2019 on evaluation today patient actually appears to look somewhat better with regard to the appearance of his wound bed at this time. This is good news. Overall I am very pleased with how things seem to have progressed just in the past week with a switch to the [facility] dressing. I think that has been beneficial for him. With that being said at this time the patient is concerned about his biopsy that I sent off last week unfortunately I do not have that report as of yet. Nonetheless we have called to obtain this and hopefully will hear back from the lab later this morning. 03/15/19 on evaluation today patient's wound actually appears to be doing okay today with regard to the overall appearance of the wound bed. He has been tolerating the dressing changes without complication he still has hyper granular tissue noted but fortunately that seems to be minimal at this point compared to some of what we've seen in the past. Nonetheless I do think that he is still having some issues currently with some of this type of granulation the biopsy and since all showed nothing more than just evidence of granulation tissue. Therefore there really is nothing different to initiate or do at this point. 03/24/19 on evaluation today patient appears to be doing a little better with regard to his ulcer. He's been tolerating the  dressing changes without complication. Fortunately there is no signs of active infection at this time. No fevers, chills, nausea, or vomiting noted at this time. I'm overall pleased with how things seem to be progressing. 03/29/2019 on evaluation today patient appears to be doing about the same in regard to his ulcer in the right gluteal fold. Unfortunately he is not seeming to make a lot of progress and the wound is somewhat stalled. There is no signs of active infection externally but I am concerned about the possibility of infection continuing in the ischial location which previously he did have infection noted. Again is not able to have an MRI so probably our best option for testing for this would be a triple phase bone scan which will detect subtle changes in the bone more  so than plain film x-rays. In the past they really have not been beneficial and in fact the CT scans even have been somewhat questionable at times. Nonetheless there is no signs of systemic infection which is at least good news but again his wound is not healing at all on the predicted schedule. 04/05/19 on evaluation today patient appears to be doing well all things considering with regard to his wound and the right gluteal fold. He actually has his triple bone scan scheduled for sometime in the next couple of days. With that being said I've also been looking to other possibilities of what could be causing hyper granular tissue were looking into the bone scan again for evaluating for the risk or possibility of infection deeper and I'm also gonna go ahead and see about obtaining a deep tissue Eckerman, [place] J. ([id number]) culture today to send and see if there's any evidence of infection noted on culture. He's in agreement with that plan. 04/12/2019 on evaluation today patient presents for reevaluation here in our clinic concerning ongoing issues with his right gluteal fold ulcer. 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 It was discussed with him at that point that I think it would be appropriate for him to proceed with hyperbaric oxygen therapy especially in light of the fact that he is previously been on IV antibiotics at the beginning of the year for close to 3 months followed by several weeks of oral antibiotics that was all prescribed by infectious disease. He had surgical debridement around Christmas December 2019 due to an abscess and osteomyelitis of the ischial bone. Unfortunately this has not really proceeded as well as we would have liked and again we did a CT scan even a couple months ago as he cannot have an MRI secondary to having issues with both a pain pump as well as a spinal cord stimulator which prevent him from going into an MRI machine. With that being said there were chronic changes noted at the ischial tuberosity which had progressed since December 2019 there was no evidence of fluid collection on that initial CT scan. With that being said that was on January 21, 2019. I am not sure that it was a sensitive enough test as compared to an MRI and then subsequently I ordered a bone scan for him which was actually completed on 03/29/2019 and this revealed that he does indeed have positive osteomyelitis involving the right ischial tuberosity. This is adjacent to the ulcer and I think is the reason that his ulcer is not healing. Subsequently I am in a place him back on oral antibiotics today unfortunately his wound culture showed just mixed gram-negative flora with no specific findings of a predominant organism. Nonetheless being with the location I think a good broad-spectrum antibiotic for gram-negative's is a good choice at this point he is previously taken Levaquin without significant issues and I think that is an appropriate antibiotic for him at this time. 04/19/2019 on evaluation  today patient actually appears to be doing fairly well with regard to his wound. He has been tolerating the dressing changes without complication. Fortunately there is no signs of active infection and he has been taking the oral antibiotics at this time. Subsequently we did make a referral to infectious disease although Dr. Steva Ready wants the patient to be seen by general surgery first in order apparently to  see if there is anything from a surgical standpoint that should be done prior to initiation of IV antibiotic therapy. Again the patient is okay with actually seeing Dr. Celine Ahr whom he has seen before we will make that referral. 04/26/2019 on evaluation today patient actually appears to be doing well with regard to his wound. He has been tolerating the dressing changes without complication. Fortunately there is no signs of active infection at this time. I do believe that the hyperbaric oxygen therapy along with the antibiotics which I prescribed at this point have been doing well for him. With that being said he has not seen Dr. Celine Ahr the surgeon yet in fact they have not been contacted for scheduling an appointment as of yet. Subsequently the patient is not on antibiotics currently by IV Dr. Steva Ready did want him to see Dr. Celine Ahr first which we are working on trying to get scheduled. We again give the information to the patient today for Dr. Celine Ahr and her number as far as contacting their office to see about getting something scheduled. Again were looking for whether or not they recommend any surgical intervention. 05/03/2019 on evaluation today patient appears to be doing about the same with regard to his wound there may be a little bit of filling in in regard to the base of the wound but still he has quite a bit of hyper granular tissue that I am seeing today. I believe that he may may need a more aggressive sharp debridement possibly even by the surgeon or under anesthesia in order to  clear away some of the hyper granular material in order to help allow for appropriate granulation to fill in. We have made a referral to Dr. Celine Ahr unfortunately it sounds as if they did not receive the referral we contacted them today they should be get in touch with the patient. Depending on how things show from that standpoint the patient may need to see Dr. Ardyth Gal who is the infectious disease specialist although he really does not want a PICC line again. No fevers, chills, nausea, vomiting, or diarrhea. He is tolerating hyperbaric oxygen therapy very well. 05/12/2019 on evaluation today patient presents for follow-up visit concerning his right gluteal fold ulcer. He did see Dr. Celine Ahr the surgeon who previously evaluated his wound and she actually felt like he was doing quite well with regard to the wound based on what she was seen. He does seem to be responding to some degree with the oral antibiotics along with hyperbaric oxygen therapy at this point she did not see any evidence or need for further surgical intervention at this point. She recommended deferring additional or ongoing antibiotic therapy to Dr. Steva Ready at her discretion. Fortunately there is no signs of active systemic infection. No fevers, chills, nausea, vomiting, or diarrhea. 10/29; patient I am seeing really for the first time today in terms of his wound. 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 He is being treated with hyperbaric oxygen for underlying osteomyelitis most recently documented by a three-phase bone scan. He has been on Levaquin for roughly a month and he is out of these and asked me for a refill. Most recent cultures were negative although I do not think these were bone cultures. He has a very irregular surface to this wound which is almost nodular in texture. It undermines superiorly with the same nodular hyper granulated surface. I see that he was referred to general surgery for  an operative  debridement although the surgeon did not agree with this I think that would have been the proper course of action in this. He has been using Hydrofera Blue. He is supposed to start a wound VAC tomorrow which is actually a reapplication of wound VAC. I think mostly last time they had trouble keeping the seal in place I hope we have better look at this this time. ZACKARIAH, RUPARD ([id number]) 05/24/2019 on evaluation today I did review patient's note from Dr. Dellia Nims where he was seen last week when I was out of the office. Subsequently it does appear that Dr. Dellia Nims was in agreement that the patient really needed debridement to clear away some of the nodular tissue in the base of the wound. The good news is he has the wound VAC initiated and some of this tissue is already starting to breakdown under the treatment of the wound VAC again because it is not really viable. Nonetheless I do believe that we can likely perform some debridement today to clear this away and hopefully the continuation of the wound VAC along with hyperbarics and antibiotic therapy will be beneficial in stopping this from reoccurring. If we get better control in the wound bed in a good spot I think we can definitely use the PriMatrix which we actually did get approval for. Patient History Information obtained from Patient. Family History Hypertension - Father, Stroke - Mother, No family history of Cancer, Diabetes, Heart Disease, Kidney Disease, Lung Disease, Seizures, Thyroid Problems, Tuberculosis. Social History Never smoker, Marital Status - Married, Alcohol Use - Never, Drug Use - No History, Caffeine Use - Daily. Medical History Eyes Patient has history of Cataracts - both removed Denies history of Glaucoma, Optic Neuritis Ear/Nose/Mouth/Throat Denies history of Chronic sinus problems/congestion, Middle ear problems Hematologic/Lymphatic Denies history of Anemia, Hemophilia, Human Immunodeficiency  Virus, Lymphedema Respiratory Denies history of Aspiration, Asthma, Chronic Obstructive Pulmonary Disease (COPD), Pneumothorax, Sleep Apnea, Tuberculosis Cardiovascular Patient has history of Hypertension - takes medication Denies history of Angina, Arrhythmia, Congestive Heart Failure, Coronary Artery Disease, Deep Vein Thrombosis, Hypotension, Myocardial Infarction, Peripheral Arterial Disease, Peripheral Venous Disease, Phlebitis, Vasculitis Gastrointestinal Denies history of Cirrhosis , Colitis, Crohn s, Hepatitis A, Hepatitis B, Hepatitis C Endocrine Denies history of Type I Diabetes, Type II Diabetes Genitourinary Denies history of End Stage Renal Disease Immunological Denies history of Lupus Erythematosus, Raynaud s, Scleroderma Integumentary (Skin) Denies history of History of Burn, History of pressure wounds Musculoskeletal Denies history of Gout, Rheumatoid Arthritis, Osteoarthritis, Osteomyelitis Neurologic Patient has history of Paraplegia - waist down Denies history of Dementia, Neuropathy, Quadriplegia, Seizure Disorder Oncologic Denies history of Received Chemotherapy, Received Radiation Psychiatric Denies history of Anorexia/bulimia, Confinement Anxiety Medical And Surgical History Notes Oncologic Prostate cancer- currently treated with horomone therapy SHAZIL, HILAIRE ([id number]) Review of Systems (ROS) Constitutional Symptoms (General Health) Denies complaints or symptoms of Fatigue, Fever, Chills, Marked Weight Change. Respiratory Denies complaints or symptoms of Chronic or frequent coughs, Shortness of Breath. Cardiovascular Denies complaints or symptoms of Chest pain, LE edema. Psychiatric Denies complaints or symptoms of Anxiety, Claustrophobia. Objective Constitutional Well-nourished and well-hydrated in no acute distress. Vitals Time Taken: 10:15 AM, Height: 73 in, Weight: 210 lbs, BMI: 27.7, Temperature: 98.2 F, Pulse: 72 bpm,  Respiratory Rate: 16 breaths/min, Blood Pressure: 120/72 mmHg. Respiratory normal breathing without difficulty. clear to auscultation bilaterally. Cardiovascular regular rate and rhythm with normal S1, S2. Psychiatric this patient is able to make decisions and demonstrates good insight into disease process. Alert and Oriented  x 3. pleasant and cooperative. General Notes: Patient's wound bed upon evaluation today actually showed signs of nodular poor granulation areas along with some better granulation areas. To be honest I feel like that he is making slow progress towards improvement but I do feel like things look better today than they did last time I saw him. I do believe the wound VAC has been beneficial in this regard as well. Overall I think that is just can be a matter of time for [place] to get this under control. I did perform sharp debridement today to remove some of the nodular hyper granular areas that were poor for healing and the patient tolerated this without complication he did have some bleeding pressure was applied and controlled the bleeding however. Integumentary (Hair, Skin) Wound #1 status is Open. Original cause of wound was Pressure Injury. The wound is located on the Right Gluteal fold. The wound measures 3cm length x 2.5cm width x 2.6cm depth; 5.89cm^2 area and 15.315cm^3 volume. There is muscle and Fat Layer (Subcutaneous Tissue) Exposed exposed. There is tunneling at 12:00 with a maximum distance of 3.6cm. There is a large amount of serous drainage noted. The wound margin is epibole. There is large (67-100%) pink, hyper - granulation within the wound bed. There is a small (1-33%) amount of necrotic tissue within the wound bed including Adherent Slough. 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 There was a Excisional Skin/Subcutaneous Tissue Debridement with a total area of 7.5 sq cm performed by [name], HOYT E., [name]. With the following instrument(s): Forceps, and Scissors to remove Viable and Non-Viable tissue/material. Material removed includes Subcutaneous Tissue and Hyper-granulation and after achieving pain control using Lidocaine 4% Topical Solution. No specimens were taken. A time out was conducted at 10:40, prior to the start of the procedure. A Moderate amount of bleeding was controlled with Pressure. The procedure was tolerated well with a pain level of 0 throughout and a pain level of 0 following the procedure. Post Debridement Measurements: 3cm length x 2.5cm width x 2.7cm depth; 15.904cm^3 volume. Post debridement Stage noted as Category/Stage IV. Character of Wound/Ulcer Post Debridement is improved. Post procedure Diagnosis Wound #1: Same as Pre-Procedure Plan Wound Cleansing: Wound #1 Right Gluteal fold: Clean wound with Normal Saline. - in office Cleanse wound with mild soap and water Skin Barriers/Peri-Wound Care: Skin Prep Primary Wound Dressing: Wound #1 Right Gluteal fold: Silver Collagen - under NPWT Dressing Change Frequency: Wound #1 Right Gluteal fold: Change Dressing Monday, Wednesday, Friday Follow-up Appointments: Wound #1 Right Gluteal fold: Return Appointment in 1 week. Off-Loading: Wound #1 Right Gluteal fold: Turn and reposition every 2 hours Home Health: Wound #1 Right Gluteal fold: [place] Nurse may visit PRN to address patient s wound care needs. DAVARI, LEDON ([id number]) FACE TO FACE ENCOUNTER: MEDICARE and MEDICAID PATIENTS: I certify that this patient is under my care and that I had a face-to-face  encounter that meets the physician face-to-face encounter requirements with this patient on this date. The encounter with the patient was in whole or in part for the following MEDICAL CONDITION: (primary reason for [place]) MEDICAL NECESSITY: I certify, that based on my  findings, NURSING services are a medically necessary home health service. HOME BOUND STATUS: I certify that my clinical findings support that this patient is homebound (i.e., Due to illness or injury, pt requires aid of supportive devices such as crutches, cane, wheelchairs, walkers, the use of special transportation or the assistance of another person to leave their place of residence. There is a normal inability to leave the home and doing so requires considerable and taxing effort. Other absences are for medical reasons / religious services and are infrequent or of short duration when for other reasons). If current dressing causes regression in wound condition, may D/C ordered dressing product/s and apply Normal Saline Moist Dressing daily until next [place] / Other MD appointment. [place] of regression in wound condition at [phone]. Please direct any NON-WOUND related issues/requests for orders to patient's Primary Care Physician Negative Pressure Wound Therapy: Wound #1 Right Gluteal fold: Wound VAC settings at 125/130 mmHg continuous pressure. Use BLACK/GREEN foam to wound cavity. Use WHITE foam to fill any tunnel/s and/or undermining. Change VAC dressing 3 X WEEK. Change canister as indicated when full. Nurse may titrate settings and frequency of dressing changes as clinically indicated. Home Health Nurse may d/c VAC for s/s of increased infection, significant wound regression, or uncontrolled drainage. [place] at [phone]. Apply contact layer over base of wound. - apply prisma/silver collagen to the base of the wound Number of foam/gauze pieces used in  the dressing = Place NPWT on HOLD. General Notes: In clinic today [place] moistened gauze, xtrasorb and tegaderm 1. Patient's wound bed at this time seems to be doing better although were not at the point yet of using the PriMatrix we do have that in the back however. 2. With regard to the dressing I would recommend that we use some silver collagen underneath the negative pressure wound therapy I think that is appropriate but I think the negative pressure wound therapy is still the optimal thing. 3. I do recommend continue turning and repositioning to avoid any issues with the wound or even breaking the seal on the wound VAC. We will see patient back for reevaluation in 1 week here in the clinic. If anything worsens or changes patient will contact our office for additional recommendations. Electronic Signature(s) Signed: 05/26/2019 8:20:11 AM By: Worthy Keeler [name] Entered By: Worthy Keeler on 05/26/2019 08:20:11 NAVRAJ, SAMPAYO ([id number]) -------------------------------------------------------------------------------- ROS/PFSH Details Patient Name: [name] Date of Service: 05/24/2019 10:30 AM Medical Record Number: [id number] Patient Account Number: [id number] Date of Birth/Sex: 1950/12/04 (68 y.o. M) Treating RN: Montey Hora Primary Care Provider: [name] Other Clinician: Referring Provider: [name] Treating Provider/Extender: [name], HOYT Weeks in Treatment: 38 Information Obtained From Patient Constitutional Symptoms (General Health) Complaints and Symptoms: Negative for: Fatigue; Fever; Chills; Marked Weight Change Respiratory Complaints and Symptoms: Negative for: Chronic or frequent coughs; Shortness of Breath Medical History: Negative for: Aspiration; Asthma; Chronic Obstructive Pulmonary Disease (COPD); Pneumothorax; Sleep Apnea; Tuberculosis Cardiovascular Complaints and Symptoms: Negative for: Chest pain; LE edema Medical  History: Positive for: Hypertension - takes medication Negative for: Angina; Arrhythmia; Congestive Heart Failure; Coronary Artery Disease; Deep Vein Thrombosis; Hypotension; Myocardial Infarction; Peripheral Arterial Disease; Peripheral Venous Disease; Phlebitis; Vasculitis Psychiatric Complaints and Symptoms: Negative for: Anxiety; Claustrophobia Medical History: Negative for: Anorexia/bulimia; Confinement Anxiety Eyes Medical History: Positive for: Cataracts - both removed Negative for: Glaucoma; Optic Neuritis Ear/Nose/Mouth/Throat Medical History: Negative for: Chronic sinus problems/congestion; Middle ear problems Hematologic/Lymphatic Medical  History: Negative for: Anemia; Hemophilia; Human Immunodeficiency Virus; Lymphedema JAHEIR, PAISLEY. ([id number]) Gastrointestinal Medical History: Negative for: Cirrhosis ; Colitis; Crohnos; Hepatitis A; Hepatitis B; Hepatitis C Endocrine Medical History: Negative for: Type I Diabetes; Type II Diabetes Genitourinary Medical History: Negative for: End Stage Renal Disease Immunological Medical History: Negative for: Lupus Erythematosus; Raynaudos; Scleroderma Integumentary (Skin) Medical History: Negative for: History of Burn; History of pressure wounds Musculoskeletal Medical History: Negative for: Gout; Rheumatoid Arthritis; Osteoarthritis; Osteomyelitis Neurologic Medical History: Positive for: Paraplegia - waist down Negative for: Dementia; Neuropathy; Quadriplegia; Seizure Disorder Oncologic Medical History: Negative for: Received Chemotherapy; Received Radiation Past Medical History Notes: Prostate cancer- currently treated with horomone therapy HBO Extended History Items Eyes: Cataracts Immunizations Pneumococcal Vaccine: Received Pneumococcal Vaccination: No Implantable Devices No devices added Family and Social History Cancer: No; Diabetes: No; Heart Disease: No; Hypertension: Yes - Father; Kidney  Disease: No; Lung Disease: No; Seizures: No; Stroke: Yes - Mother; Thyroid Problems: No; Tuberculosis: No; Never smoker; Marital Status - Married; Alcohol Use: Never; Drug Use: No History; Caffeine Use: Daily MUZZAMMIL, STANDISH ([id number]) Physician Affirmation I have reviewed and agree with the above information. Electronic Signature(s) Signed: 05/26/2019 6:06:55 PM By: Worthy Keeler [name] Signed: 06/08/2019 4:47:24 PM By: Montey Hora Entered By: Worthy Keeler on 05/26/2019 08:18:50 IZACC, DORSETT ([id number]) -------------------------------------------------------------------------------- SuperBill Details Patient Name: [name] Date of Service: 05/24/2019 Medical Record Number: [id number] Patient Account Number: [id number] Date of Birth/Sex: 1950/12/20 (68 y.o. M) Treating RN: Montey Hora Primary Care Provider: [name] Other Clinician: Referring Provider: [name] Treating Provider/Extender: [name], HOYT Weeks in Treatment: 26 Diagnosis Coding ICD-10 Codes Code Description L89.314 Pressure ulcer of right buttock, stage 4 M86.68 Other chronic osteomyelitis, other site L03.317 Cellulitis of buttock G82.20 Paraplegia, unspecified S34.109S Unspecified injury to unspecified level of lumbar spinal cord, sequela I10 Essential (primary) hypertension Facility Procedures CPT4 Code: [id number] Description: [place] - DEB SUBQ TISSUE 20 SQ CM/< ICD-10 Diagnosis Description L89.314 Pressure ulcer of right buttock, stage 4 Modifier: Quantity: 1 Physician Procedures CPT4 Code: [id number] Description: [id number] - WC PHYS SUBQ TISS 20 SQ CM ICD-10 Diagnosis Description L89.314 Pressure ulcer of right buttock, stage 4 Modifier: Quantity: 1 Electronic Signature(s) Signed: 05/26/2019 8:20:46 AM By: Worthy Keeler [name] Entered By: Worthy Keeler on 05/26/2019 08:20:46

## 2019-05-24 NOTE — Progress Notes (Signed)
[name] ([id number]) Visit Report for 05/24/2019 Arrival Information Details Patient Name: [name], [name] Date of Service: 05/24/2019 8:00 AM Medical Record Number: [id number] Patient Account Number: [id number] Date of Birth/Sex: Jun 24, 1951 (68 y.o. M) Treating RN: Montey Hora Primary Care Kippy Melena: [name] Other Clinician: [name] Referring Providencia Hottenstein: [name] Treating Tacy Chavis/Extender: [name], HOYT Weeks in Treatment: 56 Visit Information History Since Last Visit Added or deleted any medications: No Patient Arrived: Wheel Chair Any new allergies or adverse reactions: No Arrival Time: 07:57 Had a fall or experienced change in No Accompanied By: wife activities of daily living that may affect Transfer Assistance: Harrel Lemon Lift risk of falls: Patient Identification Verified: Yes Signs or symptoms of abuse/neglect since last visito No Secondary Verification Process Completed: Yes Hospitalized since last visit: No Patient Requires Transmission-Based No Implantable device outside of the clinic excluding No Precautions: cellular tissue based products placed in the center Patient Has Alerts: Yes since last visit: Patient Alerts: NOT Has Dressing in Place as Prescribed: Yes Diabetic Pain Present Now: No Electronic Signature(s) Signed: 05/24/2019 10:35:44 AM By: Lorine Bears RCP, RRT, CHT Entered By: Lorine Bears on 05/24/2019 08:24:30 TYDELL, AVALON ([id number]) -------------------------------------------------------------------------------- Encounter Discharge Information Details Patient Name: [name] Date of Service: 05/24/2019 8:00 AM Medical Record Number: [id number] Patient Account Number: [id number] Date of Birth/Sex: July 31, 1950 (68 y.o. M) Treating RN: Montey Hora Primary Care Darilyn Storbeck: [name] Other Clinician: [name] Referring Corynne Scibilia: [name] Treating Aubriegh Minch/Extender: [name], HOYT Weeks in Treatment: 8 Encounter Discharge Information Items Discharge Condition: Stable Ambulatory Status: Wheelchair Discharge Destination: Home Transportation: Private Auto Accompanied By: wife Schedule Follow-up Appointment: Yes Clinical Summary of Care: Notes Patient has an HBO treatment scheduled on 05/25/2019 at 08:00 am. Electronic Signature(s) Signed: 05/24/2019 10:35:44 AM By: Lorine Bears RCP, RRT, CHT Entered By: Lorine Bears on 05/24/2019 10:35:18 MARCIN, MCELHENNEY ([id number]) -------------------------------------------------------------------------------- Vitals Details Patient Name: [name] Date of Service: 05/24/2019 8:00 AM Medical Record Number: [id number] Patient Account Number: [id number] Date of Birth/Sex: [birth year] (68 y.o. M) Treating RN: Montey Hora Primary Care Kaylamarie Swickard: [name] Other Clinician: [name] Referring Furious Chiarelli: [name] Treating Woods Gangemi/Extender: [name], HOYT Weeks in Treatment: 73 Vital Signs Time Taken: 08:13 Temperature (F): 98.4 Height (in): 73 Pulse (bpm): 66 Weight (lbs): 210 Respiratory Rate (breaths/min): 16 Body Mass Index (BMI): 27.7 Blood Pressure (mmHg): 118/68 Reference Range: 80 - 120 mg / dl Electronic Signature(s) Signed: 05/24/2019 10:35:44 AM By: Lorine Bears RCP, RRT, CHT Entered By: Lorine Bears on 05/24/2019 08:25:13

## 2019-05-25 ENCOUNTER — Encounter: Payer: Medicare Other | Admitting: Internal Medicine

## 2019-05-25 ENCOUNTER — Other Ambulatory Visit: Payer: Self-pay

## 2019-05-25 DIAGNOSIS — L89314 Pressure ulcer of right buttock, stage 4: Secondary | ICD-10-CM | POA: Diagnosis not present

## 2019-05-25 NOTE — Progress Notes (Signed)
OLUWADAMILARE, WRITT ([id number]) Visit Report for 05/25/2019 Arrival Information Details Patient Name: [name], [name] Date of Service: 05/25/2019 8:00 AM Medical Record Number: [id number] Patient Account Number: [id number] Date of Birth/Sex: 08/31/1950 (68 y.o. M) Treating RN: [name] Primary Care Nayden Czajka: [name] Other Clinician: [name] Referring Ambriana Selway: [name] Treating Harlon Kutner/Extender: [name] in Treatment: 4 Visit Information History Since Last Visit Added or deleted any medications: No Patient Arrived: Wheel Chair Any new allergies or adverse reactions: No Arrival Time: 07:57 Had a fall or experienced change in No Accompanied By: wife activities of daily living that may affect Transfer Assistance: Harrel Lemon Lift risk of falls: Patient Identification Verified: Yes Signs or symptoms of abuse/neglect since last visito No Secondary Verification Process Completed: Yes Hospitalized since last visit: No Patient Requires Transmission-Based No Implantable device outside of the clinic excluding No Precautions: cellular tissue based products placed in the center Patient Has Alerts: Yes since last visit: Patient Alerts: NOT Has Dressing in Place as Prescribed: Yes Diabetic Pain Present Now: No Electronic Signature(s) Signed: 05/25/2019 10:26:17 AM By: Lorine Bears RCP, RRT, CHT Entered By: Lorine Bears on 05/25/2019 07:58:19 [name], [name] ([id number]) -------------------------------------------------------------------------------- Encounter Discharge Information Details Patient Name: [name] Date of Service: 05/25/2019 8:00 AM Medical Record Number: [id number] Patient Account Number: [id number] Date of Birth/Sex: [birth year] (68 y.o. M) Treating RN: [name] Primary Care Everette Mall: [name] Other Clinician: [name] Referring Zi Sek: [name] Treating  Nikiesha Milford/Extender: [name] in Treatment: 71 Encounter Discharge Information Items Discharge Condition: Stable Ambulatory Status: Wheelchair Discharge Destination: Home Transportation: Private Auto Accompanied By: wife Schedule Follow-up Appointment: Yes Clinical Summary of Care: Notes Patient has an HBO treatment scheduled on 05/26/2019 at 08:00 am. Electronic Signature(s) Signed: 05/25/2019 10:26:17 AM By: Lorine Bears RCP, RRT, CHT Entered By: Lorine Bears on 05/25/2019 10:26:00 [name], [name] ([id number]) -------------------------------------------------------------------------------- Vitals Details Patient Name: [name] Date of Service: 05/25/2019 8:00 AM Medical Record Number: [id number] Patient Account Number: [id number] Date of Birth/Sex: 1950/08/21 (68 y.o. M) Treating RN: [name] Primary Care Anayeli Arel: [name] Other Clinician: [name] Referring Abbie Berling: [name] Treating Chanc Kervin/Extender: [name] in Treatment: 29 Vital Signs Time Taken: 08:15 Temperature (F): 98.8 Height (in): 73 Pulse (bpm): 66 Weight (lbs): 210 Respiratory Rate (breaths/min): 16 Body Mass Index (BMI): 27.7 Blood Pressure (mmHg): 116/62 Reference Range: 80 - 120 mg / dl Electronic Signature(s) Signed: 05/25/2019 10:26:17 AM By: Lorine Bears RCP, RRT, CHT Entered By: Lorine Bears on 05/25/2019 08:19:32

## 2019-05-25 NOTE — Progress Notes (Signed)
[name], [name] ([id number]) Visit Report for 05/23/2019 HBO Details Patient Name: [name], [name] Date of Service: 05/23/2019 8:00 AM Medical Record Number: [id number] Patient Account Number: [id number] Date of Birth/Sex: 1950-10-31 (68 y.o. 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 HBO Treatment Details Treatment Number: 23 Patient Type: Outpatient Chamber Type: Monoplace Chamber Serial #: [id number] Treatment Protocol: 2.0 ATA with 90 minutes oxygen, and no air breaks Treatment Details Compression Rate Down: 1.5 psi / minute De-Compression Rate Up: 2.0 psi / minute Compress Tx Pressure Air breaks and breathing periods Decompress Decompress Begins Reached (leave unused spaces blank) Begins Ends Chamber Pressure (ATA) 1 2 - - - - - - 2 1 Clock Time (24 hr) 08:21 08:32 - - - - - - 10:03 10:11 Treatment Length: 110 (minutes) Treatment Segments: 4 Vital Signs Capillary Blood Glucose Reference Range: 80 - 120 mg / dl HBO Diabetic Blood Glucose Intervention Range: <131 mg/dl or >249 mg/dl Time Vitals Blood Respiratory Capillary Blood Glucose Pulse Action Type: Pulse: Temperature: Taken: Pressure: Rate: Glucose (mg/dl): Meter #: Oximetry (%) Taken: Pre 08:00 124/78 72 16 98.2 Post 10:15 124/78 72 16 98.1 Treatment Response Treatment Completion Status: Treatment Completed without Adverse Event HBO Attestation I certify that I supervised this HBO treatment in accordance with Medicare guidelines. A trained emergency response Yes team is readily available per hospital policies  and procedures. Continue HBOT as ordered. Yes Electronic Signature(s) Signed: 05/25/2019 2:09:51 AM By: Worthy Keeler [name] Previous Signature: 05/23/2019 11:16:11 AM Version By: Lorine Bears RCP, RRT, CHT [name], [name] ([id number]) Entered By: Worthy Keeler on 05/25/2019 01:55:36 [name], [name] ([id number]) -------------------------------------------------------------------------------- HBO Safety Checklist Details Patient Name: [name] Date of Service: 05/23/2019 8:00 AM Medical Record Number: [id number] Patient Account Number: [id number] Date of Birth/Sex: Nov 29, 1950 (68 y.o. M) Treating RN: Harold Barban Primary Care Lenox Ladouceur: [name] Other Clinician: [name] Referring Xaiver Roskelley: [name] Treating Bogdan Vivona/Extender: [name], HOYT Weeks in Treatment: 47 HBO Safety Checklist Items Safety Checklist Consent Form Signed Patient voided / foley secured and emptied When did you last eato morning Last dose of injectable or oral agent n/a Ostomy pouch emptied and vented if applicable NA Medtronic Baclofen Pump; Nevro All implantable devices assessed, documented and approved Spinal Stimulator Intravenous access site secured and place NA Valuables secured Linens and cotton and cotton/polyester blend (less than 51% polyester) Personal oil-based products / skin lotions / body lotions removed Wigs or hairpieces removed NA Smoking or tobacco materials removed NA Books / newspapers / magazines / loose paper removed Cologne, aftershave, perfume and deodorant removed Jewelry removed (may wrap wedding band) Make-up removed NA Hair care products removed Battery operated devices (external) removed Heating patches and chemical warmers removed Titanium eyewear removed NA Nail polish cured greater than 10 hours NA Casting material cured greater than 10 hours NA Hearing aids removed NA Loose dentures or partials  removed NA Prosthetics have been removed NA Patient demonstrates correct use of air break device (if applicable) Patient concerns have been addressed Patient grounding bracelet on and cord attached to chamber Specifics for Inpatients (complete in addition to above) Medication sheet sent with patient Intravenous medications needed or due during therapy sent  with patient Drainage tubes (e.g. nasogastric tube or chest tube secured and vented) Endotracheal or Tracheotomy tube secured Cuff deflated of air and inflated with saline Airway suctioned Electronic Signature(s) [name], [name] ([id number]) Signed: 05/23/2019 11:16:11 AM By: Lorine Bears RCP, RRT, CHT Entered By: Lorine Bears on 05/23/2019 08:31:11

## 2019-05-25 NOTE — Progress Notes (Signed)
GEFFERY, SCULTHORPE ([id number]) Visit Report for 05/23/2019 Problem List Details Patient Name: [name], [name] Date of Service: 05/23/2019 8:00 AM Medical Record Number: [id number] Patient Account Number: [id number] Date of Birth/Sex: [birth year] (68 y.o. 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, sequela I10  Essential (primary) hypertension Facility Procedures CPT4 Code: [id number] Description: (Facility Use Only) HBOT, full body chamber, 42min Modifier: Quantity: 4 Physician Procedures CPT4 Code: [id number] Description: [id number] - WC PHYS HYPERBARIC OXYGEN THERAPY ICD-10 Diagnosis Description M86.68 Other chronic osteomyelitis, other site L89.314 Pressure ulcer of right buttock, stage 4 L03.317 Cellulitis of buttock G82.20 Paraplegia, unspecified Modifier: Quantity: 1 Electronic Signature(s) Signed: 05/25/2019 2:09:51 AM By: Worthy Keeler [name] Previous Signature: 05/23/2019 11:16:11 AM Version By: Lorine Bears RCP, RRT, CHT Entered By: Worthy Keeler on 05/25/2019 01:55:42

## 2019-05-25 NOTE — Progress Notes (Signed)
TEAL, OUELLETTE ([id number]) Visit Report for 05/24/2019 Arrival Information Details Patient Name: [name], [name] Date of Service: 05/24/2019 10:30 AM Medical Record Number: [id number] Patient Account Number: [id number] Date of Birth/Sex: [birth year] (68 y.o. M) Treating RN: Montey Hora Primary Care Baldemar Dady: [name] Other Clinician: Referring Signora Zucco: [name] Treating Lateefah Mallery/Extender: [name], HOYT Weeks in Treatment: 76 Visit Information History Since Last Visit Added or deleted any medications: No Patient Arrived: Wheel Chair Any new allergies or adverse reactions: No Arrival Time: 10:22 Had a fall or experienced change in No Accompanied By: wife activities of daily living that may affect Transfer Assistance: Harrel Lemon Lift risk of falls: Patient Identification Verified: Yes Signs or symptoms of abuse/neglect since last visito No Secondary Verification Process Completed: Yes Hospitalized since last visit: No Patient Requires Transmission-Based No Implantable device outside of the clinic excluding No Precautions: cellular tissue based products placed in the center Patient Has Alerts: Yes since last visit: Patient Alerts: NOT Has Dressing in Place as Prescribed: Yes Diabetic Pain Present Now: No Electronic Signature(s) Signed: 05/24/2019 10:35:44 AM By: Lorine Bears RCP, RRT, CHT Entered By: Lorine Bears on 05/24/2019 10:22:52 CRANDALL, ARIANO ([id number]) -------------------------------------------------------------------------------- Encounter Discharge Information Details Patient Name: [name] Date of Service: 05/24/2019 10:30 AM Medical Record Number: [id number] Patient Account Number: [id number] Date of Birth/Sex: 07-Apr-1951 (68 y.o. M) Treating RN: Montey Hora Primary Care Tristain Daily: [name] Other Clinician: Referring Chynah Orihuela: [name] Treating Coraima Tibbs/Extender: [name], HOYT Weeks in Treatment: 72 Encounter Discharge Information Items Post Procedure Vitals Discharge Condition: Stable Temperature (F): 98.2 Ambulatory Status: Wheelchair Pulse (bpm): 72 Discharge Destination: Home Respiratory Rate (breaths/min): 16 Transportation: Private Auto Blood Pressure (mmHg): 120/72 Accompanied By: spouse Schedule Follow-up Appointment: Yes Clinical Summary of Care: Electronic Signature(s) Signed: 05/24/2019 4:57:37 PM By: Montey Hora Entered By: Montey Hora on 05/24/2019 11:19:52 [name] ([id number]) -------------------------------------------------------------------------------- Lower Extremity Assessment Details Patient Name: [name] Date of Service: 05/24/2019 10:30 AM Medical Record Number: [id number] Patient Account Number: [id number] Date of Birth/Sex: 09-Jun-1951 (68 y.o. M) Treating RN: Harold Barban Primary Care Elfida Shimada: [name] Other Clinician: Referring Jaquin Coy: [name] Treating [name] [name]/Extender: [name], HOYT Weeks in Treatment: 42 Notes Wound sacral Electronic Signature(s) Signed: 05/24/2019 4:24:18 PM By: Harold Barban Entered By: Harold Barban on 05/24/2019 10:36:26 ARISH, RODRICK ([id number]) -------------------------------------------------------------------------------- Multi Wound Chart Details Patient Name: [name] Date of Service: 05/24/2019 10:30 AM Medical Record Number: [id number] Patient Account Number: [id number] Date of Birth/Sex: [birth year] (68 y.o. M) Treating RN: Montey Hora Primary Care Garima Chronis: [name] Other Clinician: Referring Shiana Rappleye: [name] Treating Kalle Bernath/Extender: [name], HOYT Weeks in Treatment: 76 Vital Signs Height(in): 73 Pulse(bpm): 72 Weight(lbs): 210 Blood Pressure(mmHg): 120/72 Body Mass Index(BMI): 28 Temperature(F): 98.2 Respiratory Rate 16 (breaths/min): Photos: [N/A:N/A] Wound  Location: Right Gluteal fold N/A N/A Wounding Event: Pressure Injury N/A N/A Primary Etiology: Pressure Ulcer N/A N/A Comorbid History: Cataracts, Hypertension, N/A N/A Paraplegia Date Acquired: 11/02/2017 N/A N/A Weeks of Treatment: 77 N/A N/A Wound Status: Open N/A N/A Measurements L x W x D 3x2.5x2.6 N/A N/A (cm) Area (cm) : 5.89 N/A N/A Volume (cm) : 15.315 N/A N/A % Reduction in Area: 41.40% N/A N/A % Reduction in Volume: -1423.90% N/A N/A Position 1 (o'clock): 12 Maximum Distance 1 (cm): 3.6 Tunneling: Yes N/A N/A Classification: Category/Stage IV N/A N/A Exudate Amount: Large N/A N/A Exudate Type: Serous N/A N/A Exudate Color: amber N/A N/A Wound Margin: Epibole N/A N/A Granulation Amount: Large (67-100%)  N/A N/A Granulation Quality: Pink, Hyper-granulation N/A N/A Necrotic Amount: Small (1-33%) N/A N/A Exposed Structures: Fat Layer (Subcutaneous N/A N/A Tissue) Exposed: Yes Muscle: Yes Fascia: No CHRISTOPHER, MCARDLE. ([id number]) Tendon: No Joint: No Bone: No Epithelialization: Small (1-33%) N/A N/A Treatment Notes Electronic Signature(s) Signed: 05/24/2019 4:57:37 PM By: Montey Hora Entered By: Montey Hora on 05/24/2019 10:57:57 [name] ([id number]) -------------------------------------------------------------------------------- [place] Details Patient Name: [name] Date of Service: 05/24/2019 10:30 AM Medical Record Number: [id number] Patient Account Number: [id number] Date of Birth/Sex: Apr 04, 1951 (68 y.o. M) Treating RN: Montey Hora Primary Care Mirai Greenwood: [name] Other Clinician: Referring Lataysha Vohra: [name] Treating Ilene Witcher/Extender: [name], HOYT Weeks in Treatment: 32 Active Inactive Orientation to the Wound Care Program Nursing Diagnoses: Knowledge deficit related to the wound healing center program Goals: Patient/caregiver will verbalize understanding of the [facility] Date Initiated: 11/30/2017 Target Resolution Date: 12/21/2017 Goal Status: Active Interventions: Provide education on orientation to the wound center Notes: Pressure Nursing Diagnoses: Knowledge deficit related to management of pressures ulcers Goals: Patient/caregiver will verbalize understanding of pressure ulcer management Date Initiated: 05/17/2018 Target Resolution Date: 05/28/2018 Goal Status: Active Interventions: Assess: immobility, friction, shearing, incontinence upon admission and as needed Notes: Wound/Skin Impairment Nursing Diagnoses: Impaired tissue integrity Goals: Patient/caregiver will verbalize understanding of skin care regimen Date Initiated: 11/30/2017 Target Resolution Date: 12/21/2017 Goal Status: Active Ulcer/skin breakdown will have a volume reduction of 30% by week 4 Date Initiated: 11/30/2017 Target Resolution Date: 12/21/2017 Goal Status: Active JAILON, CASIQUE ([id number]) Interventions: Assess patient/caregiver ability to obtain necessary supplies Assess patient/caregiver ability to perform ulcer/skin care regimen upon admission and as needed Assess ulceration(s) every visit Treatment Activities: Skin care regimen initiated : 11/30/2017 Notes: Electronic Signature(s) Signed: 05/24/2019 4:57:37 PM By: Montey Hora Entered By: Montey Hora on 05/24/2019 10:57:49 JUNAYD, SALAZAR ([id number]) -------------------------------------------------------------------------------- Pain Assessment Details Patient Name: [name] Date of Service: 05/24/2019 10:30 AM Medical Record Number: [id number] Patient Account Number: [id number] Date of Birth/Sex: [birth year] (68 y.o. M) Treating RN: Montey Hora Primary Care Aliyanna Wassmer: [name] Other Clinician: Referring Maverick Patman: [name] Treating Semaj Coburn/Extender: [name], HOYT Weeks in Treatment: 8 Active Problems Location of Pain Severity and Description of  Pain Patient Has Paino No Site Locations Pain Management and Medication Current Pain Management: Electronic Signature(s) Signed: 05/24/2019 10:35:44 AM By: Paulla Fore, RRT, CHT Signed: 05/24/2019 4:57:37 PM By: Montey Hora Entered By: Lorine Bears on 05/24/2019 10:22:59 CARRICK, WINKELBAUER ([id number]) -------------------------------------------------------------------------------- Patient/Caregiver Education Details Patient Name: [name] Date of Service: 05/24/2019 10:30 AM Medical Record Number: [id number] Patient Account Number: [id number] Date of Birth/Gender: 05-15-1951 (68 y.o. M) Treating RN: Montey Hora Primary Care Physician: [name] Other Clinician: Referring Physician: [name] Treating Physician/Extender: Sharalyn Ink in Treatment: 29 Education Assessment Education Provided To: Patient and Caregiver Education Topics Provided Wound/Skin Impairment: Handouts: Other: wound care if needed Methods: Explain/Verbal Responses: State content correctly Electronic Signature(s) Signed: 05/24/2019 4:57:37 PM By: Montey Hora Entered By: Montey Hora on 05/24/2019 11:08:42 [name] ([id number]) -------------------------------------------------------------------------------- Wound Assessment Details Patient Name: [name] Date of Service: 05/24/2019 10:30 AM Medical Record Number: [id number] Patient Account Number: [id number] Date of Birth/Sex: 04-21-1951 (68 y.o. M) Treating RN: Montey Hora Primary Care Izayiah Tibbitts: [name] Other Clinician: Referring Koleson Reifsteck: [name] Treating Glyndon Tursi/Extender: [name], HOYT Weeks in Treatment: 77 Wound Status Wound Number: 1 Primary Etiology: Pressure Ulcer Wound Location: Right Gluteal fold Wound Status: Open Wounding  Event: Pressure Injury Comorbid History: Cataracts, Hypertension, Paraplegia Date Acquired:  11/02/2017 Weeks Of Treatment: 77 Clustered Wound: No Photos Wound Measurements Length: (cm) 3 % Reductio Width: (cm) 2.5 % Reductio Depth: (cm) 2.6 Epithelial Area: (cm) 5.89 Tunneling Volume: (cm) 15.315 Positi Maximum n in Area: 41.4% n in Volume: -1423.9% ization: Small (1-33%) : Yes on (o'clock): 12 Distance: (cm) 3.6 Wound Description Classification: Category/Stage IV Foul Odor Wound Margin: Epibole Slough/Fib Exudate Amount: Large Exudate Type: Serous Exudate Color: amber After Cleansing: No rino Yes Wound Bed Granulation Amount: Large (67-100%) Exposed Structure Granulation Quality: Pink, Hyper-granulation Fascia Exposed: No Necrotic Amount: Small (1-33%) Fat Layer (Subcutaneous Tissue) Exposed: Yes Necrotic Quality: Adherent Slough Tendon Exposed: No Muscle Exposed: Yes Necrosis of Muscle: No Joint Exposed: No Bone Exposed: No Zabawa, Damarion J. ([id number]) Treatment Notes Wound #1 (Right Gluteal fold) Notes zinc to peri wound, dakins moistened gauze, xtrasorb and tegaderm Electronic Signature(s) Signed: 05/24/2019 4:24:18 PM By: Harold Barban Signed: 05/24/2019 4:57:37 PM By: Montey Hora Previous Signature: 05/24/2019 10:35:44 AM Version By: Lorine Bears RCP, RRT, CHT Entered By: Harold Barban on 05/24/2019 10:35:37 JARQUIS, REICHL ([id number]) -------------------------------------------------------------------------------- Vitals Details Patient Name: [name] Date of Service: 05/24/2019 10:30 AM Medical Record Number: [id number] Patient Account Number: [id number] Date of Birth/Sex: 04-26-1951 (68 y.o. M) Treating RN: Montey Hora Primary Care Brynda Heick: [name] Other Clinician: Referring Autumnrose Yore: [name] Treating Danely Bayliss/Extender: [name], HOYT Weeks in Treatment: 46 Vital Signs Time Taken: 10:15 Temperature (F): 98.2 Height (in): 73 Pulse (bpm): 72 Weight (lbs): 210 Respiratory  Rate (breaths/min): 16 Body Mass Index (BMI): 27.7 Blood Pressure (mmHg): 120/72 Reference Range: 80 - 120 mg / dl Electronic Signature(s) Signed: 05/24/2019 10:35:44 AM By: Lorine Bears RCP, RRT, CHT Entered By: Becky Sax, Amado Nash on 05/24/2019 10:26:47

## 2019-05-26 ENCOUNTER — Encounter: Payer: Medicare Other | Admitting: Physician Assistant

## 2019-05-26 DIAGNOSIS — L89314 Pressure ulcer of right buttock, stage 4: Secondary | ICD-10-CM | POA: Diagnosis not present

## 2019-05-26 NOTE — Progress Notes (Signed)
[name], [name] ([id number]) Visit Report for 05/26/2019 Arrival Information Details Patient Name: [name], [name] Date of Service: 05/26/2019 8:00 AM Medical Record Number: [id number] Patient Account Number: [id number] Date of Birth/Sex: 07-29-1950 (68 y.o. M) Treating RN: [name] Primary Care Texanna Hilburn: [name] Other Clinician: [name] Referring [name]: [name] [name], [name] in Treatment: 57 Visit Information History Since Last Visit Added or deleted any medications: No Patient Arrived: Wheel Chair Any new allergies or adverse reactions: No Arrival Time: 08:05 Had a fall or experienced change in No Accompanied By: wife activities of daily living that may affect Transfer Assistance: Harrel Lemon Lift risk of falls: Patient Identification Verified: Yes Signs or symptoms of abuse/neglect since last visito No Secondary Verification Process Completed: Yes Hospitalized since last visit: No Patient Requires Transmission-Based No Implantable device outside of the clinic excluding No Precautions: cellular tissue based products placed in the center Patient Has Alerts: Yes since last visit: Patient Alerts: NOT Has Dressing in Place as Prescribed: Yes Diabetic Pain Present Now: No Electronic Signature(s) Signed: 05/26/2019 10:34:41 AM By: [name] Entered By: Brett Bears on 05/26/2019 09:10:45 [name] ([id number]) -------------------------------------------------------------------------------- Encounter Discharge Information Details Patient Name: [name] Date of Service: 05/26/2019 8:00 AM Medical Record Number: [id number] Patient Account Number: [id number] Date of Birth/Sex: Jul 23, 1950 (68 y.o. M) Treating RN: [name] Primary Care Alister Staver: [name] Other Clinician: [name] Referring [name]: [name] [name], [name] in Treatment: 22 Encounter Discharge Information Items Discharge Condition: Stable Ambulatory Status: Wheelchair Discharge Destination: Home Transportation: Private Auto Accompanied By: wife Schedule Follow-up Appointment: Yes Clinical Summary of Care: Notes Patient has an HBO treatment scheduled on 05/30/2019 at 08:00 am. Electronic Signature(s) Signed: 05/26/2019 10:34:41 AM By: [name] Entered By: Brett Bears on 05/26/2019 10:34:22 [name] ([id number]) -------------------------------------------------------------------------------- Vitals Details Patient Name: [name] Date of Service: 05/26/2019 8:00 AM Medical Record Number: [id number] Patient Account Number: [id number] Date of Birth/Sex: 1951/07/16 (68 y.o. M) Treating RN: [name] Primary Care Maguadalupe Lata: [name] Other Clinician: [name] Referring [name]: [name] [name], [name] in Treatment: 81 Vital Signs Time Taken: 08:18 Temperature (F): 99.1 Height (in): 73 Pulse (bpm): 72 Weight (lbs): 210 Respiratory Rate (breaths/min): 16 Body Mass Index (BMI): 27.7 Blood Pressure (mmHg): 118/64 Reference Range: 80 - 120 mg / dl Electronic Signature(s) Signed: 05/26/2019 10:34:41 AM By: [name] Entered By: Brett Bears on 05/26/2019 09:11:25

## 2019-05-26 NOTE — Progress Notes (Signed)
ALESSIO, MCQUILLIN ([id number]) Visit Report for 05/26/2019 Problem List Details Patient Name: [name], [name] Date of Service: 05/26/2019 8:00 AM Medical Record Number: [id number] Patient Account Number: [id number] Date of Birth/Sex: Jan 18, 1951 (68 y.o. 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, sequela I10 Essential  (primary) hypertension Facility Procedures CPT4 Code: [id number] Description: (Facility Use Only) HBOT, full body chamber, 30min Modifier: Quantity: 4 Physician Procedures CPT4 Code: [id number] Description: [id number] - WC PHYS HYPERBARIC OXYGEN THERAPY ICD-10 Diagnosis Description M86.68 Other chronic osteomyelitis, other site L89.314 Pressure ulcer of right buttock, stage 4 L03.317 Cellulitis of buttock G82.20 Paraplegia, unspecified Modifier: Quantity: 1 Electronic Signature(s) Signed: 05/26/2019 5:17:38 PM By: Worthy Keeler [name] Previous Signature: 05/26/2019 10:34:41 AM Version By: Lorine Bears RCP, RRT, CHT Entered By: Worthy Keeler on 05/26/2019 17:17:38

## 2019-05-26 NOTE — Progress Notes (Signed)
TERMAINE, FORTUNO ([id number]) Visit Report for 05/24/2019 Problem List Details Patient Name: [name], [name] Date of Service: 05/24/2019 8:00 AM Medical Record Number: [id number] Patient Account Number: [id number] Date of Birth/Sex: June 13, 1951 (68 y.o. 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, sequela I10  Essential (primary) hypertension Facility Procedures CPT4 Code: [id number] Description: (Facility Use Only) HBOT, full body chamber, 2min Modifier: Quantity: 4 Physician Procedures CPT4 Code: [id number] Description: [id number] - WC PHYS HYPERBARIC OXYGEN THERAPY ICD-10 Diagnosis Description M86.68 Other chronic osteomyelitis, other site L89.314 Pressure ulcer of right buttock, stage 4 L03.317 Cellulitis of buttock G82.20 Paraplegia, unspecified Modifier: Quantity: 1 Electronic Signature(s) Signed: 05/26/2019 6:06:55 PM By: Worthy Keeler [name] Previous Signature: 05/24/2019 10:35:44 AM Version By: Lorine Bears RCP, RRT, CHT Entered By: Worthy Keeler on 05/25/2019 18:48:48

## 2019-05-26 NOTE — Progress Notes (Signed)
ALLANMICHAEL, PREISER ([id number]) Visit Report for 05/26/2019 HBO Details Patient Name: [name], [name] Date of Service: 05/26/2019 8:00 AM Medical Record Number: [id number] Patient Account Number: [id number] Date of Birth/Sex: 1951/02/05 (68 y.o. 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 HBO Treatment Details Treatment Number: 26 Patient Type: Outpatient Chamber Type: Monoplace Chamber Serial #: [id number] Treatment Protocol: 2.0 ATA with 90 minutes oxygen, and no air breaks Treatment Details Compression Rate Down: 1.5 psi / minute De-Compression Rate Up: 1.5 psi / minute Compress Tx Pressure Air breaks and breathing periods Decompress Decompress Begins Reached (leave unused spaces blank) Begins Ends Chamber Pressure (ATA) 1 2 - - - - - - 2 1 Clock Time (24 hr) 08:23 08:35 - - - - - - 10:05 10:15 Treatment Length: 112 (minutes) Treatment Segments: 4 Vital Signs Capillary Blood Glucose Reference Range: 80 - 120 mg / dl HBO Diabetic Blood Glucose Intervention Range: <131 mg/dl or >249 mg/dl Time Vitals Blood Respiratory Capillary Blood Glucose Pulse Action Type: Pulse: Temperature: Taken: Pressure: Rate: Glucose (mg/dl): Meter #: Oximetry (%) Taken: Pre 08:18 118/64 72 16 99.1 Post 10:21 126/64 78 16 98.1 Treatment Response Treatment Toleration: Well Treatment Completion Treatment Completed without Adverse Event Status: HBO Attestation I certify that I supervised this HBO treatment in accordance with Medicare guidelines. A trained emergency response Yes team is readily  available per hospital policies and procedures. Continue HBOT as ordered. Yes Electronic Signature(s) ARAS, ENCINA ([id number]) Signed: 05/26/2019 5:17:30 PM By: Worthy Keeler [name] Previous Signature: 05/26/2019 10:34:41 AM Version By: Lorine Bears RCP, RRT, CHT Entered By: Worthy Keeler on 05/26/2019 17:17:30 JAMARQUS, MCSHEA ([id number]) -------------------------------------------------------------------------------- HBO Safety Checklist Details Patient Name: [name] Date of Service: 05/26/2019 8:00 AM Medical Record Number: [id number] Patient Account Number: [id number] Date of Birth/Sex: [birth year] (68 y.o. M) Treating RN: Army Melia Primary Care Quinten Allerton: [name] Other Clinician: [name] Referring Jaimere Feutz: [name] Treating Tanveer Brammer/Extender: [name], HOYT Weeks in Treatment: 27 HBO Safety Checklist Items Safety Checklist Consent Form Signed Patient voided / foley secured and emptied When did you last eato morning Last dose of injectable or oral agent n/a Ostomy pouch emptied and vented if applicable NA Medtronic Baclofen Pump; Nevro All implantable devices assessed, documented and approved Spinal Stimulator Intravenous access site secured and place NA Valuables secured Linens and cotton and cotton/polyester blend (less than 51% polyester) Personal oil-based products / skin lotions / body lotions removed Wigs or hairpieces removed NA Smoking or tobacco materials removed NA Books / newspapers / magazines / loose paper removed Cologne, aftershave, perfume and deodorant removed Jewelry removed (may wrap wedding band) Make-up removed NA Hair care products removed Battery operated devices (external) removed NPWT pump Heating patches and chemical warmers removed Titanium eyewear removed NA Nail polish cured greater than 10 hours NA Casting material cured greater than 10 hours NA Hearing aids  removed NA Loose dentures or partials removed NA Prosthetics have been removed NA Patient demonstrates correct use of air break device (if applicable) Patient concerns have been addressed Patient grounding bracelet on and cord attached to chamber Specifics for Inpatients (complete in addition to above) Medication sheet sent with patient Intravenous medications needed  or due during therapy sent with patient Drainage tubes (e.g. nasogastric tube or chest tube secured and vented) Endotracheal or Tracheotomy tube secured Cuff deflated of air and inflated with saline Airway suctioned Electronic Signature(s) RENZO, DAUGHDRILL ([id number]) Signed: 05/26/2019 10:34:41 AM By: Lorine Bears RCP, RRT, CHT Entered By: Lorine Bears on 05/26/2019 09:12:32

## 2019-05-26 NOTE — Progress Notes (Signed)
[name], [name] ([id number]) Visit Report for 05/24/2019 HBO Details Patient Name: [name], [name] Date of Service: 05/24/2019 8:00 AM Medical Record Number: [id number] Patient Account Number: [id number] Date of Birth/Sex: 1951/03/08 (68 y.o. 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 HBO Treatment Details Treatment Number: 24 Patient Type: Outpatient Chamber Type: Monoplace Chamber Serial #: [id number] Treatment Protocol: 2.0 ATA with 90 minutes oxygen, and no air breaks Treatment Details Compression Rate Down: 1.5 psi / minute De-Compression Rate Up: 2.0 psi / minute Compress Tx Pressure Air breaks and breathing periods Decompress Decompress Begins Reached (leave unused spaces blank) Begins Ends Chamber Pressure (ATA) 1 2 - - - - - - 2 1 Clock Time (24 hr) 08:17 08:29 - - - - - - 10:00 10:08 Treatment Length: 111 (minutes) Treatment Segments: 4 Vital Signs Capillary Blood Glucose Reference Range: 80 - 120 mg / dl HBO Diabetic Blood Glucose Intervention Range: <131 mg/dl or >249 mg/dl Time Vitals Blood Respiratory Capillary Blood Glucose Pulse Action Type: Pulse: Temperature: Taken: Pressure: Rate: Glucose (mg/dl): Meter #: Oximetry (%) Taken: Pre 08:13 118/68 66 16 98.4 Post 10:15 120/72 66 16 98.2 Treatment Response Treatment Completion Status: Treatment Completed without Adverse Event HBO Attestation I certify that I supervised this HBO treatment in accordance with Medicare guidelines. A trained emergency response Yes team is readily available per hospital policies  and procedures. Continue HBOT as ordered. Yes Electronic Signature(s) Signed: 05/26/2019 6:06:55 PM By: Worthy Keeler [name] Previous Signature: 05/24/2019 10:35:44 AM Version By: Lorine Bears RCP, RRT, CHT [name], [name] ([id number]) Entered By: Worthy Keeler on 05/25/2019 18:48:43 [name], [name] ([id number]) -------------------------------------------------------------------------------- HBO Safety Checklist Details Patient Name: [name] Date of Service: 05/24/2019 8:00 AM Medical Record Number: [id number] Patient Account Number: [id number] Date of Birth/Sex: 09-10-1950 (68 y.o. M) Treating RN: Montey Hora Primary Care Jalani Cullifer: [name] Other Clinician: [name] Referring Demarcus Thielke: [name] Treating Hadlie Gipson/Extender: [name], HOYT Weeks in Treatment: 82 HBO Safety Checklist Items Safety Checklist Consent Form Signed Patient voided / foley secured and emptied When did you last eato morning Last dose of injectable or oral agent n/a Ostomy pouch emptied and vented if applicable NA Medtronic Baclofen Pump; Nevro All implantable devices assessed, documented and approved Spinal Stimulator Intravenous access site secured and place NA Valuables secured Linens and cotton and cotton/polyester blend (less than 51% polyester) Personal oil-based products / skin lotions / body lotions removed Wigs or hairpieces removed NA Smoking or tobacco materials removed NA Books / newspapers / magazines / loose paper removed Cologne, aftershave, perfume and deodorant removed Jewelry removed (may wrap wedding band) Make-up removed NA Hair care products removed Battery operated devices (external) removed NPWT pump Heating patches and chemical warmers removed Titanium eyewear removed NA Nail polish cured greater than 10 hours NA Casting material cured greater than 10 hours NA Hearing aids removed NA Loose dentures or  partials removed NA Prosthetics have been removed NA Patient demonstrates correct use of air break device (if applicable) Patient concerns have been addressed Patient grounding bracelet on and cord attached to chamber Specifics for Inpatients (complete in addition to above) Medication sheet sent with patient Intravenous medications needed or due during  therapy sent with patient Drainage tubes (e.g. nasogastric tube or chest tube secured and vented) Endotracheal or Tracheotomy tube secured Cuff deflated of air and inflated with saline Airway suctioned Electronic Signature(s) [name], [name] ([id number]) Signed: 05/24/2019 10:35:44 AM By: Lorine Bears RCP, RRT, CHT Entered By: Becky Sax, Amado Nash on 05/24/2019 08:27:48

## 2019-05-27 ENCOUNTER — Encounter: Payer: Medicare Other | Admitting: Physician Assistant

## 2019-05-27 NOTE — Progress Notes (Signed)
COREE, HAGENOW ([id number]) Visit Report for 05/25/2019 HBO Details Patient Name: [name], [name] Date of Service: 05/25/2019 8:00 AM Medical Record Number: [id number] Patient Account Number: [id number] Date of Birth/Sex: 24-Jul-1950 (68 y.o. 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 HBO Treatment Details Treatment Number: 25 Patient Type: Outpatient Chamber Type: Monoplace Chamber Serial #: [id number] Treatment Protocol: 2.0 ATA with 90 minutes oxygen, and no air breaks Treatment Details Compression Rate Down: 1.5 psi / minute De-Compression Rate Up: 1.5 psi / minute Compress Tx Pressure Air breaks and breathing periods Decompress Decompress Begins Reached (leave unused spaces blank) Begins Ends Chamber Pressure (ATA) 1 2 - - - - - - 2 1 Clock Time (24 hr) 08:17 08:29 - - - - - - 09:59 10:09 Treatment Length: 112 (minutes) Treatment Segments: 4 Vital Signs Capillary Blood Glucose Reference Range: 80 - 120 mg / dl HBO Diabetic Blood Glucose Intervention Range: <131 mg/dl or >249 mg/dl Time Vitals Blood Respiratory Capillary Blood Glucose Pulse Action Type: Pulse: Temperature: Taken: Pressure: Rate: Glucose (mg/dl): Meter #: Oximetry (%) Taken: Pre 08:15 116/62 66 16 98.8 Post 10:14 118/68 60 16 98 Treatment Response Treatment Completion Status: Treatment Completed without Adverse Event Joi Leyva Notes No concerns with treatment given HBO Attestation I certify that I supervised this HBO treatment in accordance with Medicare guidelines. A trained emergency response  Yes team is readily available per hospital policies and procedures. Continue HBOT as ordered. Yes Electronic Signature(s) DELOREAN, DUFFY ([id number]) Signed: 05/27/2019 7:47:21 AM By: Linton Ham MD Previous Signature: 05/25/2019 10:26:17 AM Version By: Lorine Bears RCP, RRT, CHT Entered By: Linton Ham on 05/26/2019 07:58:28 JAHLIL, BLANKE ([id number]) -------------------------------------------------------------------------------- HBO Safety Checklist Details Patient Name: [name] Date of Service: 05/25/2019 8:00 AM Medical Record Number: [id number] Patient Account Number: [id number] Date of Birth/Sex: 22-Oct-1950 (68 y.o. M) Treating RN: [name] Primary Care Adric Wrede: [name] Other Clinician: [name] Referring Hendryx Ricke: [name] Treating Jennfier Abdulla/Extender: [name] in Treatment: 9 HBO Safety Checklist Items Safety Checklist Consent Form Signed Patient voided / foley secured and emptied When did you last eato morning Last dose of injectable or oral agent n/a Ostomy pouch emptied and vented if applicable NA Medtronic Baclofen Pump; Nevro All implantable devices assessed, documented and approved Spinal Stimulator Intravenous access site secured and place NA Valuables secured Linens and cotton and cotton/polyester blend (less than 51% polyester) Personal oil-based products / skin lotions / body lotions removed Wigs or hairpieces removed NA Smoking or tobacco materials removed NA Books / newspapers / magazines / loose paper removed Cologne, aftershave, perfume and deodorant removed Jewelry removed (may wrap wedding band) Make-up removed NA Hair care products removed Battery operated devices (external) removed NPWT pump Heating patches and chemical warmers removed Titanium eyewear removed NA Nail polish cured greater than 10 hours NA Casting material cured greater than 10  hours NA Hearing aids removed NA Loose dentures or partials removed NA Prosthetics have been removed NA Patient demonstrates correct use of air break device (if applicable) Patient concerns have been addressed Patient grounding bracelet on and cord attached to chamber Specifics for Inpatients (complete in addition to above) Medication sheet sent with patient Intravenous  medications needed or due during therapy sent with patient Drainage tubes (e.g. nasogastric tube or chest tube secured and vented) Endotracheal or Tracheotomy tube secured Cuff deflated of air and inflated with saline Airway suctioned Electronic Signature(s) ADARIOUS, SHAMEL ([id number]) Signed: 05/25/2019 10:26:17 AM By: Lorine Bears RCP, RRT, CHT Entered By: Lorine Bears on 05/25/2019 08:20:33

## 2019-05-30 ENCOUNTER — Other Ambulatory Visit: Payer: Self-pay

## 2019-05-30 ENCOUNTER — Encounter: Payer: Medicare Other | Admitting: Physician Assistant

## 2019-05-30 DIAGNOSIS — L89314 Pressure ulcer of right buttock, stage 4: Secondary | ICD-10-CM | POA: Diagnosis not present

## 2019-05-30 NOTE — Progress Notes (Addendum)
CATON, TEFFT ([id number]) Visit Report for 05/30/2019 HBO Details Patient Name: [name], [name] Date of Service: 05/30/2019 8:00 AM Medical Record Number: [id number] Patient Account Number: [id number] Date of Birth/Sex: 1950-08-15 (68 y.o. 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 HBO Treatment Details Treatment Number: 27 Patient Type: Outpatient Chamber Type: Monoplace Chamber Serial #: [id number] Treatment Protocol: 2.0 ATA with 90 minutes oxygen, and no air breaks Treatment Details Compression Rate Down: 1.5 psi / minute De-Compression Rate Up: 2.0 psi / minute Compress Tx Pressure Air breaks and breathing periods Decompress Decompress Begins Reached (leave unused spaces blank) Begins Ends Chamber Pressure (ATA) 1 2 - - - - - - 2 1 Clock Time (24 hr) 08:21 08:33 - - - - - - 10:03 10:11 Treatment Length: 110 (minutes) Treatment Segments: 4 Vital Signs Capillary Blood Glucose Reference Range: 80 - 120 mg / dl HBO Diabetic Blood Glucose Intervention Range: <131 mg/dl or >249 mg/dl Time Vitals Blood Respiratory Capillary Blood Glucose Pulse Action Type: Pulse: Temperature: Taken: Pressure: Rate: Glucose (mg/dl): Meter #: Oximetry (%) Taken: Pre 08:18 132/72 60 16 99 Post 10:15 130/72 72 16 98.1 Treatment Response Treatment Toleration: Well Treatment Completion Treatment Completed without Adverse Event Status: HBO Attestation I certify that I supervised this HBO treatment in accordance with Medicare guidelines. A trained emergency response Yes team is readily available per  hospital policies and procedures. Continue HBOT as ordered. Yes Electronic Signature(s) OSKER, MANLEY ([id number]) Signed: 05/30/2019 3:43:12 PM By: Worthy Keeler [name] Previous Signature: 05/30/2019 11:10:04 AM Version By: Lorine Bears RCP, RRT, CHT Previous Signature: 05/30/2019 11:27:10 AM Version By: Worthy Keeler [name] Entered By: Worthy Keeler on 05/30/2019 15:43:12 [facility], [facility] ([id number]) -------------------------------------------------------------------------------- HBO Safety Checklist Details Patient Name: [name] Date of Service: 05/30/2019 8:00 AM Medical Record Number: [id number] Patient Account Number: [id number] Date of Birth/Sex: 01-28-1951 (68 y.o. M) Treating RN: Primary Care Loretha Ure: [name] Other Clinician: [name] Referring Myalynn Lingle: [name] Treating Chinedum Vanhouten/Extender: [name], HOYT Weeks in Treatment: 80 HBO Safety Checklist Items Safety Checklist Consent Form Signed Patient voided / foley secured and emptied When did you last eato morning Last dose of injectable or oral agent n/a Ostomy pouch emptied and vented if applicable NA Medtronic Baclofen Pump; Nevro All implantable devices assessed, documented and approved Spinal Stimulator Intravenous access site secured and place NA Valuables secured Linens and cotton and cotton/polyester blend (less than 51% polyester) Personal oil-based products / skin lotions / body lotions removed Wigs or hairpieces removed NA Smoking or tobacco materials removed NA Books / newspapers / magazines / loose paper removed Cologne, aftershave, perfume and deodorant removed Jewelry removed (may wrap wedding band) Make-up removed NA Hair care products removed Battery operated devices (external) removed NPWT pump Heating patches and chemical warmers removed Titanium eyewear removed NA Nail polish cured greater than 10 hours NA Casting material  cured greater than 10 hours NA Hearing aids removed NA Loose dentures or partials removed NA Prosthetics have been removed NA Patient demonstrates correct use of air break device (if applicable) Patient concerns have been addressed Patient grounding bracelet on and cord attached to chamber Specifics for Inpatients (complete in addition to above) Medication  sheet sent with patient Intravenous medications needed or due during therapy sent with patient Drainage tubes (e.g. nasogastric tube or chest tube secured and vented) Endotracheal or Tracheotomy tube secured Cuff deflated of air and inflated with saline Airway suctioned Electronic Signature(s) RIELY, HERNANDEZ ([id number]) Signed: 05/30/2019 11:10:04 AM By: Lorine Bears RCP, RRT, CHT Entered By: Lorine Bears on 05/30/2019 08:33:32

## 2019-05-30 NOTE — Progress Notes (Signed)
DODGE, FRIEDRICHS ([id number]) Visit Report for 05/30/2019 Arrival Information Details Patient Name: [name], [name] Date of Service: 05/30/2019 8:00 AM Medical Record Number: [id number] Patient Account Number: [id number] Date of Birth/Sex: 1951/02/07 (68 y.o. M) Treating RN: Primary Care Keelon Zurn: [name] Other Clinician: [name] Referring Patrici Minnis: [name] Treating Jaleeya Mcnelly/Extender: [name], HOYT Weeks in Treatment: 34 Visit Information History Since Last Visit Added or deleted any medications: No Patient Arrived: Wheel Chair Any new allergies or adverse reactions: No Arrival Time: 08:00 Had a fall or experienced change in No Accompanied By: wife activities of daily living that may affect Transfer Assistance: Harrel Lemon Lift risk of falls: Patient Identification Verified: Yes Signs or symptoms of abuse/neglect since last visito No Secondary Verification Process Completed: Yes Hospitalized since last visit: No Patient Requires Transmission-Based No Implantable device outside of the clinic excluding No Precautions: cellular tissue based products placed in the center Patient Has Alerts: Yes since last visit: Patient Alerts: NOT Has Dressing in Place as Prescribed: Yes Diabetic Pain Present Now: No Electronic Signature(s) Signed: 05/30/2019 11:10:04 AM By: Lorine Bears RCP, RRT, CHT Entered By: Lorine Bears on 05/30/2019 08:31:22 EMORI, GALAYDA ([id number]) -------------------------------------------------------------------------------- Encounter Discharge Information Details Patient Name: [name] Date of Service: 05/30/2019 8:00 AM Medical Record Number: [id number] Patient Account Number: [id number] Date of Birth/Sex: [birth year] (68 y.o. M) Treating RN: Primary Care Amea Mcphail: [name] Other Clinician: [name] Referring Kem Parcher: [name] Treating Bowen Goyal/Extender: [name], HOYT Weeks in Treatment: 16 Encounter Discharge Information Items Discharge Condition: Stable Ambulatory Status: Wheelchair Discharge Destination: Home Transportation: Private Auto Accompanied By: daughter, Joelene Millin Schedule Follow-up Appointment: Yes Clinical Summary of Care: Notes Patient has an HBO treatment scheduled on 05/31/2019 at 08:00 am. Electronic Signature(s) Signed: 05/30/2019 11:10:04 AM By: Lorine Bears RCP, RRT, CHT Entered By: Lorine Bears on 05/30/2019 11:09:46 SYRIS, VOSBURG ([id number]) -------------------------------------------------------------------------------- Vitals Details Patient Name: [name] Date of Service: 05/30/2019 8:00 AM Medical Record Number: [id number] Patient Account Number: [id number] Date of Birth/Sex: [birth year] (68 y.o. M) Treating RN: Primary Care Beldon Nowling: [name] Other Clinician: [name] Referring Clary Meeker: [name] Treating Sion Thane/Extender: [name], HOYT Weeks in Treatment: 61 Vital Signs Time Taken: 08:18 Temperature (F): 99.0 Height (in): 73 Pulse (bpm): 60 Weight (lbs): 210 Respiratory Rate (breaths/min): 16 Body Mass Index (BMI): 27.7 Blood Pressure (mmHg): 132/72 Reference Range: 80 - 120 mg / dl Electronic Signature(s) Signed: 05/30/2019 11:10:04 AM By: Lorine Bears RCP, RRT, CHT Entered By: Becky Sax, Amado Nash on 05/30/2019 08:31:59

## 2019-05-30 NOTE — Progress Notes (Signed)
ADREIN, WILLIG ([id number]) Visit Report for 05/30/2019 Problem List Details Patient Name: [name], [name] Date of Service: 05/30/2019 8:00 AM Medical Record Number: [id number] Patient Account Number: [id number] Date of Birth/Sex: September 15, 1950 (68 y.o. 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, sequela I10 Essential (primary)  hypertension Facility Procedures CPT4 Code: [id number] Description: (Facility Use Only) HBOT, full body chamber, 62min Modifier: Quantity: 4 Physician Procedures CPT4 Code: [id number] Description: [id number] - WC PHYS HYPERBARIC OXYGEN THERAPY ICD-10 Diagnosis Description M86.68 Other chronic osteomyelitis, other site L89.314 Pressure ulcer of right buttock, stage 4 L03.317 Cellulitis of buttock G82.20 Paraplegia, unspecified Modifier: Quantity: 1 Electronic Signature(s) Signed: 05/30/2019 3:43:19 PM By: Worthy Keeler [name] Previous Signature: 05/30/2019 11:10:04 AM Version By: Lorine Bears RCP, RRT, CHT Previous Signature: 05/30/2019 11:27:10 AM Version By: Worthy Keeler [name] Entered By: Worthy Keeler on 05/30/2019 15:43:19

## 2019-05-31 ENCOUNTER — Encounter: Payer: Medicare Other | Admitting: Physician Assistant

## 2019-05-31 DIAGNOSIS — L89314 Pressure ulcer of right buttock, stage 4: Secondary | ICD-10-CM | POA: Diagnosis not present

## 2019-05-31 NOTE — Progress Notes (Signed)
KULDEEP, COTES ([id number]) Visit Report for 05/31/2019 Arrival Information Details Patient Name: [name], [name] Date of Service: 05/31/2019 8:00 AM Medical Record Number: [id number] Patient Account Number: [id number] Date of Birth/Sex: 1950-07-24 (68 y.o. M) Treating RN: Montey Hora Primary Care Connie Hilgert: [name] Other Clinician: [name] Referring Javeah Loeza: [name] Treating Lolah Coghlan/Extender: [name], HOYT Weeks in Treatment: 64 Visit Information History Since Last Visit Added or deleted any medications: No Patient Arrived: Wheel Chair Any new allergies or adverse reactions: No Arrival Time: 08:00 Had a fall or experienced change in No Accompanied By: wife activities of daily living that may affect Transfer Assistance: Harrel Lemon Lift risk of falls: Patient Identification Verified: Yes Signs or symptoms of abuse/neglect since last visito No Secondary Verification Process Completed: Yes Hospitalized since last visit: No Patient Requires Transmission-Based No Implantable device outside of the clinic excluding No Precautions: cellular tissue based products placed in the center Patient Has Alerts: Yes since last visit: Patient Alerts: NOT Has Dressing in Place as Prescribed: Yes Diabetic Pain Present Now: No Electronic Signature(s) Signed: 05/31/2019 4:25:36 PM By: Lorine Bears RCP, RRT, CHT Entered By: Lorine Bears on 05/31/2019 08:26:23 IZEL, DEAKINS ([id number]) -------------------------------------------------------------------------------- Encounter Discharge Information Details Patient Name: [name] Date of Service: 05/31/2019 8:00 AM Medical Record Number: [id number] Patient Account Number: [id number] Date of Birth/Sex: Aug 25, 1950 (68 y.o. M) Treating RN: Montey Hora Primary Care Deen Deguia: [name] Other Clinician: [name] Referring Fidelis Loth: [name] Treating Dareen Gutzwiller/Extender: [name], HOYT Weeks in Treatment: 45 Encounter Discharge Information Items Discharge Condition: Stable Ambulatory Status: Wheelchair Discharge Destination: Home Transportation: Private Auto Accompanied By: wife Schedule Follow-up Appointment: Yes Clinical Summary of Care: Notes Patient has an HBO treatment scheduled on 06/01/2019 at 08:00 am. Electronic Signature(s) Signed: 05/31/2019 4:25:36 PM By: Lorine Bears RCP, RRT, CHT Entered By: Lorine Bears on 05/31/2019 10:29:45 KIMMY, PIPITONE ([id number]) -------------------------------------------------------------------------------- Vitals Details Patient Name: [name] Date of Service: 05/31/2019 8:00 AM Medical Record Number: [id number] Patient Account Number: [id number] Date of Birth/Sex: 01/23/1951 (68 y.o. M) Treating RN: Montey Hora Primary Care Cabela Pacifico: [name] Other Clinician: [name] Referring Chancey Ringel: [name] Treating Quintavia Rogstad/Extender: [name], HOYT Weeks in Treatment: 19 Vital Signs Time Taken: 08:12 Temperature (F): 98.7 Height (in): 73 Pulse (bpm): 60 Weight (lbs): 210 Respiratory Rate (breaths/min): 16 Body Mass Index (BMI): 27.7 Blood Pressure (mmHg): 138/80 Reference Range: 80 - 120 mg / dl Electronic Signature(s) Signed: 05/31/2019 4:25:36 PM By: Lorine Bears RCP, RRT, CHT Entered By: Lorine Bears on 05/31/2019 08:26:59

## 2019-05-31 NOTE — Progress Notes (Addendum)
LADERRIUS, SHELTON ([id number]) Visit Report for 05/31/2019 Chief Complaint Document Details Patient Name: [name], [name] Date of Service: 05/31/2019 10:15 AM Medical Record Number: [id number] Patient Account Number: [id number] Date of Birth/Sex: [birth year] (68 y.o. M) Treating RN: Montey Hora Primary Care Provider: [name] Other Clinician: [name] Referring Provider: [name] Treating Provider/Extender: [name], HOYT Weeks in Treatment: 92 Information Obtained from: Patient Chief Complaint Right gluteal fold ulcer Electronic Signature(s) Signed: 05/31/2019 10:33:01 AM By: Worthy Keeler [name] Entered By: Worthy Keeler on 05/31/2019 10:33:01 EMMAUEL, ERK ([id number]) -------------------------------------------------------------------------------- Debridement Details Patient Name: [name] Date of Service: 05/31/2019 10:15 AM Medical Record Number: [id number] Patient Account Number: [id number] Date of Birth/Sex: [birth year] (68 y.o. M) Treating RN: Montey Hora Primary Care Provider: [name] Other Clinician: [name] Referring Provider: [name] Treating Provider/Extender: [name], HOYT Weeks in Treatment: 73 Debridement Performed for Wound #1 Right Gluteal fold Assessment: Performed By: Physician [name], HOYT E., [name] Debridement Type: Debridement Level of Consciousness (Pre- Awake and Alert procedure): Pre-procedure Verification/Time Yes - 11:07 Out Taken: Start Time: 11:07 Pain Control: Lidocaine 4% Topical Solution Total Area Debrided (L x W): 2.4 (cm) x 2.5 (cm) = 6 (cm) Tissue and other material Viable, Non-Viable, Slough, Subcutaneous, Slough, Hyper-granulation debrided: Level: Skin/Subcutaneous Tissue Debridement Description: Excisional Instrument: Curette Bleeding: Moderate Hemostasis Achieved: Silver Nitrate End Time: 11:14 Procedural Pain: 0 Post Procedural Pain:  0 Response to Treatment: Procedure was tolerated well Level of Consciousness Awake and Alert (Post-procedure): Post Debridement Measurements of Total Wound Length: (cm) 2.4 Stage: Category/Stage IV Width: (cm) 2.5 Depth: (cm) 2.4 Volume: (cm) 11.31 Character of Wound/Ulcer Post Improved Debridement: Post Procedure Diagnosis Same as Pre-procedure Electronic Signature(s) Signed: 05/31/2019 11:46:59 AM By: Montey Hora Signed: 05/31/2019 5:47:19 PM By: Worthy Keeler [name] Entered By: Montey Hora on 05/31/2019 11:46:59 TALMAGE, HORRY ([id number]) -------------------------------------------------------------------------------- HPI Details Patient Name: [name] Date of Service: 05/31/2019 10:15 AM Medical Record Number: [id number] Patient Account Number: [id number] Date of Birth/Sex: 10-17-1950 (68 y.o. 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 He states that there is no history of diabetes that he is aware of. He does have issues with his prostate and is currently receiving treatment for this by way of oral medication. With that being said I do not have a lot of details in that regard. Nonetheless the patient presents today as a result of having been referred to [place] by another provider initially home health was set to come out and take care of his wound although due to the fact that he apparently drives he's not able to receive home health. His wife is therefore trying to help take care of this  wound within although they have been struggling with what exactly to do at this point. She states that she can do some things but she is definitely not a nurse and does have some issues with looking at blood. The good news is the wound does not appear to be too deep and is fairly superficial at this point. There is no slough noted there  is some nonviable skin noted around the surface of the wound and the perimeter at this point. The central portion of the wound appears to be very good with a dermal layer noted this does not appear to be again deep enough to extend it to subcutaneous tissue at this point. 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 In the pump he has Dilaudid and baclofen. 12/07/17 on evaluation today patient presents for follow-up concerning his ongoing lower back thigh ulcer on the right. He states that he did not get the supplies ordered and therefore has not really been able to perform the dressing changes as directed exactly. His wife was able to get some Boarder Foam Dressing's from the drugstore and subsequently has been using hydrogel which did help to a degree in the wound does appear to be able smaller. There is actually more drainage this week noted than previous. 12/21/17 on evaluation today patient appears to be doing rather well in regard to his right gluteal ulcer. He has been tolerating the dressing changes without complication. There does not appear to be any evidence of infection at this point in time. Overall the wound does seem to be making some progress as far as the edges are concerned there's not as much in the way of overlapping of the external wound edges and he has a good epithelium to wound bed border for the most part. This however is not true right at the 12 o'clock location over the span of a little over a centimeters which actually will require  debridement today to clean this away and hopefully allow it to continue to heal more appropriately. 12/28/17 on evaluation today patient appears to be doing rather well in regard to his ulcer in the left gluteal region. He's been tolerating the dressing changes without complication. Apparently he has had some difficulty getting his dressing material. Apparently there's been some confusion with ordering we're gonna check into this. Nonetheless overall he's been showing signs of improvement which is good news. Debridement is not required today. 01/04/18 on evaluation today patient presents for follow-up concerning his right gluteal ulcer. He has been tolerating the dressing changes fairly well. On inspection today it appears he may actually have some maceration them concerned about the fact that he may be developing too much moisture in and around the wound bed which can cause delay in healing. With that being said he unfortunately really has not showed significant signs of improvement since last week's evaluation in fact this may even be just the little bit/slightly larger. Nonetheless he's been having a lot of discomfort I'm not sure this is even related to the wound as he has no pain when I'm to breeding or otherwise cleaning the wound during evaluation today. Nonetheless this is something that we did recommend he talked to his pain specialist concerning. 01/11/18 on evaluation today patient appears to be doing better in regard to his ulceration. He has been tolerating the dressing changes without complication. With that being said overall there's no evidence of infection which is good news. The only thing is he did receive the hatch affair blue classic versus the ready nonetheless I feel like this is perfectly fine and appears to have done well for him over the past week. 01/25/18 on evaluation today patient's wound actually appears to be a little bit larger than during the last evaluation. The  good  LONALD, LUMPKIN ([id number]) news is the majority of the wound edges actually appear to be fairly firmly attached to the wound bed unfortunately again we're not really making progress in regard to the size. Roughly the wound is about the same size as when I first saw him although again the wound margin/edges appear to be much better. 02/01/18 on evaluation today patient actually appears to be doing very well in regard to his wound. Applying the Prisma dry does seem to be better although he does still have issues with slow progression of the wound. There was a slight improvement compared to last week's measurements today. Nonetheless I have been considering other options as far as the possibility of Theraskin or even a snap vac. In general I'm not sure that the Theraskin due to location of the wound would be a very good idea. Nonetheless I do think that a snap vac could be a possibility for the patient and in fact I think this could even be an excellent way to manage the wound possibly seeing some improvement in a very rapid fashion here. Nonetheless this is something that we would need to get approved and I did have a lengthy conversation with the patient about this today. 02/08/18 on evaluation today patient appears to be doing a little better in regard to his ulcer. He has been tolerating the dressing changes without complication. Fortunately despite the fact that the wound is a little bit smaller it's not significantly so unfortunately. We have discussed the possibility of a snap vac we did check with insurance this is actually covered at this point. Fortunately there does not appear to be any sign of infection. Overall I'm fairly pleased with how things seem to be appearing at this point. 02/15/18 on evaluation today patient appears to be doing rather well in regard to his right gluteal ulcer. Unfortunately the snap vac did not stay in place with his sheer and friction this came loose and  did not seem to maintain seal very well. He worked for about two days and it did seem to do very well during that time according to his wife but in general this does not seem to be something that's gonna be beneficial for him long-term. I do believe we need to go back to standard dressings to see if we can find something that will be of benefit. 03/02/18- He is here in follow up evaluation; there is minimal change in the wound. He will continue with the same treatment plan, would consider changing to iodosrob/iodoflex if ulcer continues to to plateau. He will follow up next week 03/08/18 on evaluation today patient's wound actually appears to be about the same size as when I previously saw him several weeks back. Unfortunately he does have some slightly dark discoloration in the central portion of the wound which has me concerned about pressure injury. I do believe he may be sitting for too long a period of time in fact he tells me that "I probably sit for much too long". He does have some Slough noted on the surface of the wound and again as far as the size of the wound is concerned I'm really not seeing anything that seems to have improved significantly. 03/15/18 on evaluation today patient appears to be doing fairly well in regard to his ulcer. The wound measured pretty much about the same today compared to last week's evaluation when looking at his graph. With that being said the area of bruising/deep tissue injury that  was noted last week I do not see at this point. He did get a new cushion fortunately this does seem to be have been of benefit in my pinion. It does appear that he's been off of this more which is good news as well I think that is definitely showing in the overall wound measurements. With that being said I do believe that he needs to continue to offload I don't think that the fact this is doing better should be or is going to allow him to not have to offload and explain this to him  as well. Overall he seems to be in agreement the plan I think he understands. The overall appearance of the wound bed is improved compared to last week I think the Iodoflex has been beneficial in that regard. 03/29/18 on evaluation today patient actually appears to be doing rather well in regard to his wound from the overall appearance standpoint he does have some granulation although there's some Slough on the surface of the wound noted as well. With that being said he unfortunately has not improved in regard to the overall measurement of the wound in volume or in size. I did have a discussion with him very specifically about offloading today. He actually does work although he mainly is just sitting throughout the day. He tells me he offloads by "lifting himself up for 30 seconds off of his chair occasionally" purchase from [employer] which does seem to have helped. And he has a new cushion that he with that being said he's also able to stand some for a very short period of time but not significant enough I think to provide appropriate offloading. I think the biggest issue at this point with the wound and the fact is not healing as quickly as we would like is due to the fact that he is really not able to appropriately offload while at work. He states the beginning after his injury he actually had a bed at his job that he could lay on in order to offload and that does seem to have been of help back at that time. Nonetheless he had not done this in quite some time unfortunately. I think that could be helpful for him this is something I would like for him to look into. 04/05/18 on evaluation today patient actually presents for follow-up concerning his right gluteal ulcer. Again he really is not significantly improved even compared to last week. He has been tolerating the dressing changes without complication. With that being said fortunately there appears to be no evidence of infection at this  time. He has been more proactive in trying to offload. NIKO, PALM ([id number]) 04/12/18 on evaluation today patient actually appears to be doing a little better in regard to his wound and the right gluteal fold region. He's been tolerating the dressing changes since removing the oasis without complication. However he was having a lot of burning initially with the oasis in place. He's unsure of exactly why this was given so much discomfort but he assumes that it was the oasis itself causing the problem. Nonetheless this had to be removed after about three days in place although even those three days seem to have made a fairly good improvement in regard to the overall appearance of the wound bed. In fact is the first time that he's made any improvement from the standpoint of measurements in about six weeks. He continues to have no discomfort over the area of the wound  itself which leads me to wonder why he was having the burning with the oasis when he does not even feel the actual debridement's themselves. I am somewhat perplexed by this. 04/19/18 on evaluation today patient's wound actually appears to be showing signs of epithelialization around the edge of the wound and in general actually appears to be doing better which is good news. He did have the same burning after about three days with applying the Endoform last week in the same fashion that I would generally apply a skin substitute. This seems to indicate that it's not the oasis to cause the problem but potentially the moisture buildup that just causes things to burn or there may be some other reaction with the skin prep or Steri-Strips. Nonetheless I'm not sure that is gonna be able to tolerate any skin substitute for a long period of time. The good news is the wound actually appears to be doing better today compared to last week and does seem to finally be making some progress. 04/26/18 on evaluation today patient actually appears to  be doing rather well in regard to his ulcer in the right gluteal fold. He has been tolerating the dressing changes without complication which is good news. The Endoform does seem to be helping although he was a little bit more macerated this week. This seems to be an ongoing issue with fluid control at this point. Nonetheless I think we may be able to add something like Drawtex to help control the drainage. 05/03/18 on evaluation today patient appears to actually be doing better in regard to the overall appearance of his wound. He has been tolerating the dressing changes without complication. Fortunately there appears to be no evidence of infection at this time. I really feel like his wound has shown signs as of today of turning around last week I thought so as well and definitely he could be seen in this week's overall appearance and measurements. In general I'm very pleased with the fact that he finally seems to be making a steady but sure progress. The patient likewise is very pleased. 05/17/18 on evaluation today patient appears to be doing more poorly unfortunately in regard to his ulcer. He has been tolerating the dressing changes without complication. With that being said he tells me that in the past couple of days he and his wife have noticed that we did not seem to be doing quite as well is getting dark near the center. Subsequently upon evaluation today the wound actually does appear to be doing worse compared to previous. He has been tolerating the dressing changes otherwise and he states that he is not been sitting up anymore than he was in the past from what he tells me. Still he has continued to work he states "I'm tired of dealing with this and if I have to just go home and lay in the bed all the time that's what I'll do". Nonetheless I am concerned about the fact that this wound does appear to be deeper than what it was previous. 05/24/18 upon evaluation today patient actually  presents after having been in the hospital due to what was presumed to be sepsis secondary to the wound infection. He had an elevated white blood cell count between 14 and 15. With that being said he does seem to be doing somewhat better now. His wound still is giving him some trouble nonetheless and he is obviously concerned about the fact likely talked about that this does seem to  go more deeply than previously noted. I did review his wound culture which showed evidence of Staphylococcus aureus him and group B strep. Nonetheless he is on antibiotics, Levaquin, for this. Subsequently I did review his intake summary from the hospital as well. I also did look at the CT of the lumbar spine with contrast that was performed which showed no bone destruction to suggest lumbar disguises/osteomyelitis or sacral osteomyelitis. There was no paraspinal abscess. Nonetheless it appears this may have been more of just a soft tissue infection at this point which is good news. He still is nonetheless concerned about the wound which again I think is completely reasonable considering everything he's been through recently. 05/31/18 on evaluation today on evaluation today patient actually appears to be showing signs of his wound be a little bit deeper than what I would like to see. Fortunately he does not show any signs of significant infection although his temperature was 99 today he states he's been checking this at home and has not been elevated. Nonetheless with the undermining that I'm seeing at this point I am becoming more concerned about the wound I do think that offloading is a key factor here that is preventing the speedy recovery at this point. There does not appear to be any evidence of again over infection noted. He's been using Santyl currently. 06/07/18 the patient presents today for follow-up evaluation regarding the left ulcer in the gluteal region. He has been tolerating the Wound VAC fairly well. He  is obviously very frustrated with this he states that to mean is really getting in his way. There does not appear to be any evidence of infection at this time he does have a little bit of odor I do not necessarily associate this with infection just something that we sometimes notice with Wound VAC therapy. With that being said I can definitely catch a tone of discontentment overall in the patient's demeanor today. This when he was previously in the hospital DRAYSEN, LOWERY. ([id number]) an CT scan was done of the lumbar region which did not reveal any signs of osteomyelitis. With that being said the pelvis in particular was not evaluated distinctly which means he could still have some osteonecrosis I. Nonetheless the Wound VAC was started on Thursday I do want to get this little bit more time before jumping to a CT scan of the pelvis although that is something that I might would recommend if were not see an improvement by that time. 06/14/18 on evaluation today patient actually appears to be doing about the same in regard to his right gluteal ulcer. Again he did have a CT scan of the lumbar spine unfortunately this did not include the pelvis. Nonetheless with the depth of the wound that I'm seeing today even despite the fact that I'm not seeing any evidence of overt cellulitis I believe there's a good chance that we may be dealing with osteomyelitis somewhere in the right Ischial region. No fevers, chills, nausea, or vomiting noted at this time. 06/21/18 on evaluation today patient actually appears to be doing about the same with regard to his wound. The tunnel at 6 o'clock really does not appear to be any deeper although it is a little bit wider. I think at this point you may want to start packing this with white phone. Unfortunately I have not got approval for the CT scan of the pelvis as of yet due to the fact that Medicare apparently has been denied it due to  the diagnosis codes not being  appropriate according to Medicare for the test requested. With that being said the patient cannot have an MRI and therefore this is the only option that we have as far as testing is concerned. The patient has had infection and was on antibiotics and been added code for cellulitis of the bottom to see if this will be appropriate for getting the test approved. Nonetheless I'm concerned about the infection have been spread deeper into the Ischial region. 06/28/18 on evaluation today patient actually appears to be doing rather well all things considered in regard to the right gluteal ulcer. He has been tolerating the dressing changes without complication. With that being said the Wound VAC he states does have to be replaced almost every day or at least reinforced unfortunately. Patient actually has his CT scan later this morning we should have the results by tomorrow. 07/05/18 on evaluation today patient presents for follow-up concerning his right Ischial ulcer. He did see the surgeon Dr. Lysle Pearl last week. They were actually very happy with him and felt like he spent a tremendous amount of time with them as far as discussing his situation was concerned. In the end Dr. Lysle Pearl did contact me as well and determine that he would not recommend any surgical intervention at this point as he felt like it would not be in the patient's best interest based on what he was seeing. He recommended a referral to infectious disease. Subsequently this is something that Dr. Ines Bloomer office is working on setting up for the patient. As far as evaluation today is concerned the patient's wound actually appears to be worse at this point. I am concerned about how things are progressing and specifically about infection. I do not feel like it's the deeper but the area of depth is definitely widening which does have me concerned. No fevers, chills, nausea, or vomiting noted at this time. I think that we do need initiate antibiotic  therapy the patient has an allow allergy to amoxicillin/penicillin he states that he gets a rash since childhood. Nonetheless she's never had the issues with Catholics or cephalosporins in general but he is aware of. 07/27/18 on evaluation today patient presents following admission to the hospital on 07/09/18. He was subsequently discharged on 07/20/18. On 07/15/18 the patient underwent irrigation and debridement was soft tissue biopsy and bone biopsy as well as placement of a Wound VAC in the OR by Dr. Celine Ahr. During the hospital course the patient was placed on a Wound VAC and recommended follow up with surgery in three weeks actually with Dr. Delaine Lame who is infectious disease. The patient was on vancomycin during the hospital course. 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 He also had a bone culture which revealed evidence of methicillin-resistant staph aureus. He is updated CT scan 07/09/18 reveals that he had progression of the which was performed on wound to breakdown down to the trochanter where he actually had irregularities there as well suggestive of osteomyelitis. This was a change just since 9 December when we last performed a CT scan. Obviously this one had gone downhill quite significantly and rapidly. At this point upon evaluation I feel like in general the patient's wound seems to be doing fairly well all things considered upon my evaluation today. Obviously this is larger and deeper than what I previously evaluated but at the same time he seems to be making some progress as far as the appearance of the granulation  tissue is concerned. I'm happy in that regard. No fevers, chills, nausea, or vomiting noted at this time. He is on IV vancomycin and Rocephin at the facility. He is currently in [facility]. 08/03/18 upon evaluation today patient's wound appears to be doing better in regard to the overall appearance at this point in time.  Fortunately he's been tolerating the Wound VAC without complication and states that the facility has been taking excellent care of the wound site. Overall I see some Slough noted on the surface which I am going to attempt sharp debridement today of but nonetheless other than this I feel like he's making progress. 08/09/18 on evaluation today patient's wound appears to be doing much better compared to even last week's evaluation. Do believe that the Wound VAC is been of great benefit for him. He has been tolerating the dressing changes that is the Wound BRIAM, KNUCKLES. ([id number]) VAC without any complication and he has excellent granulation noted currently. There is no need for sharp debridement at this point. 08/16/18 on evaluation today patient actually appears to be doing very well in regard to the wound in the right gluteal fold region. This is showing signs of progress and again appears to be very healthy which is excellent news. Fortunately there is no sign of active infection by way of odor or drainage at this point. Overall I'm very pleased with how things stand. He seems to be tolerating the Wound VAC without complication. 08/23/18 on evaluation today patient actually appears to be doing better in regard to his wound. He has been tolerating the Wound VAC without complication and in fact it has been collecting a significant amount of drainage which I think is good news especially considering how the wound appears. Fortunately there is no signs of infection at this time definitely nothing appears to be worse which is good news. He has not been started on the Bactrim and Flagyl that was recommended by Dr. Delaine Lame yet. I did actually contact her office this morning in order to check and see were things are that regard their gonna be calling me back. 08/30/18 on evaluation today patient actually appears to show signs of excellent improvement today compared to last evaluation. The  undermining is getting much better the wound seems to be feeling quite nicely and I'm very pleased that the granulation in general. With that being said overall I feel like the patient has made excellent progress which is great news. No fevers, chills, nausea, or vomiting noted at this time. 09/06/18 on evaluation today patient actually appears to be doing rather well in regard to his right gluteal ulcer. This is showing signs of improvement in overall I'm very pleased with how things seem to be progressing. The patient likewise is please. Overall I see no evidence of infection he is about to complete his oral antibiotic regimen which is the end of the antibiotics for him in just about three days. 09/13/18 on evaluation today patient's right Ischial ulcer appears to be showing signs of continued improvement which is excellent news. He's been tolerating the dressing changes without complication. Fortunately there's no signs of infection and the wound that seems to be doing very well. 09/28/18 on evaluation today patient appears to be doing rather well in regard to his right Ischial ulcer. He's been tolerating the Wound VAC without complication he knows there's much less drainage than there used to be this obviously is not a bad thing in my pinion. There's no evidence of  infection despite the fact is but nothing about it now for several weeks. 10/04/18 on evaluation today patient appears to be doing better in regard to his right Ischial wound. He has been tolerating the Wound VAC without complication and I do believe that the silver nitrate last week was beneficial for him. Fortunately overall there's no evidence of active infection at this time which is great news. No fevers, chills, nausea, or vomiting noted at this time. 10/11/18 on evaluation today patient actually appears to be doing rather well in regard to his Ischial ulcer. He's been tolerating the Wound VAC still without complication I feel like  this is doing a good job. No fevers, chills, nausea, or vomiting noted at this time. 11/01/18 on evaluation today patient presents after having not been seen in our clinic for several weeks secondary to the fact that he was on evaluation today patient presents after having not been seen in our clinic for several weeks secondary to the fact that he was in a skilled nursing facility which was on lockdown currently due to the covert 19 national emergency. Subsequently he was discharged from the facility on this past Friday and subsequently made an appointment to come in to see yesterday. Fortunately there's no signs of active infection at this time which is good news and overall he does seem to have made progress since I last saw. Overall I feel like things are progressing quite nicely. The patient is having no pain. 11/08/18 on evaluation today patient appears to be doing okay in regard to his right gluteal ulcer. He has been utilizing a Wound VAC home health this changing this at this point since he's home from the skilled nursing facility. Fortunately there's no signs of obvious active infection at this time. Unfortunately though there's no obvious active infection he is having some maceration and his wife states that when the sheets of the Wound VAC office on Sunday when it broke seal that he ended up having significant issues with some smell as well there concerned about the possibility of infection. Fortunately there's No fevers, chills, nausea, or vomiting noted at this time. 11/15/18 on evaluation today patient actually appears to be doing well in regard to his right gluteal ulcer. He has been tolerating the dressing changes without complication. Specifically the Wound VAC has been utilized up to this point. Fortunately there's no signs of infection and overall I feel like he has made progress even since last week when I last saw him. I'm actually fairly happy with the overall appearance although  he does seem to have somewhat of a hyper granular Roylance, Keanan J. ([id number]) overgrowth in the central portion of the wound which I think may require some sharp debridement to try flatness out possibly utilizing chemical cauterization following. 11/23/18 on evaluation today patient actually appears to be doing very well in regard to his sacral ulcer. He seems to be showing signs of improvement with good granulation. With that being said he still has the small area of hyper granulation right in the central portion of the wound which I'm gonna likely utilize silver nitrate on today. Subsequently he also keeps having a leak at the 6 o'clock location which is unfortunate we may be able to help out with some suggestions to try to prevent this going forward. Fortunately there's no signs of active infection at this time. 11/29/18 on evaluation today patient actually appears to be doing quite well in regard to his pressure ulcer in the right gluteal  fold region. He's been tolerating the dressing changes without complication. Fortunately there's no signs of active infection at this time. I've been rather pleased with how things have progressed there still some evidence of pressure getting to the area with some redness right around the immediate wound opening. Nonetheless other than this I'm not seeing any significant complications or issues the wound is somewhat hyper granular. Upon discussing with the patient and his wife today I'm not sure that the wound is being packed to the base with the foam at this point. And if it's not been packed fully that may be part of the reason why is not seen as much improvement as far as the granulation from the base out. Again we do not want pack too tightly but we need some of the firm to get to the base of the wound. I discussed this with patient and his wife today. 12/06/18 on evaluation today patient appears to be doing well in regard to his right gluteal pressure  ulcer. He's been tolerating the dressing changes without complication. Fortunately there's no signs of active infection. He still has some hyper granular tissue and I do think it would be appropriate to continue with the chemical cauterization as of today. 12/16/18 on evaluation today patient actually appears to be doing okay in regard to his right gluteal ulcer. He is been tolerating the dressing changes without complication including the Wound VAC. Overall I feel like nothing seems to be worsening I do feel like that the hyper granulation buds in the central portion of the wound have improved to some degree with the silver nitrate. We will have to see how things continue to progress. 12/20/18 on evaluation today patient actually appears to be doing much worse in my pinion even compared to last week's evaluation. Unfortunately as opposed to showing any signs of improvement the areas of hyper granular tissue in the central portion of the wound seem to be getting worse. Subsequently the wound bed itself also seems to be getting deeper even compared to last week which is both unusual as well as concerning since prior he had been shown signs of improvement. Nonetheless I think that the issue could be that he's actually having some difficulty in issues with a deeper infection. There's no external signs of infection but nonetheless I am more worried about the internal, osteomyelitis, that could be restarting. He has not been on antibiotics for some time at this point. I think that it may be a good idea to go ahead and started back on an antibiotic therapy while we wait to see what the testing shows. 12/27/18 on evaluation today patient presents for follow-up concerning his left gluteal fold wound. Fortunately he appears to be doing well today. I did review the CT scan which was negative for any signs of osteomyelitis or acute abnormality this is excellent news. Overall I feel like the surface of the wound bed  appears to be doing significantly better today compared to previously noted findings. There does not appear any signs of infection nor does he have any pain at this time. 01/03/19 on evaluation today patient actually appears to be doing quite well in regard to his ulcer. Post debridement last week he really did not have too much bleeding which is good news. Fortunately today this seems to be doing some better but we still has some of the hyper granular tissue noted in the base of the wound which is gonna require sharp debridement today as well.  Overall I'm pleased with how things seem to be progressing since we switched away from the Wound VAC I think he is making some progress. 01/10/19 on evaluation today patient appears to be doing better in regard to his right gluteal fold ulcer. He has been tolerating the dressing changes without complication. The debridement to seem to be helping with current away some of the poor hyper granular tissue bugs throughout the region of his gluteal fold wound. He's been tolerating the dressing changes otherwise without complication which is great news. No fevers, chills, nausea, or vomiting noted at this time. 01/17/19 on evaluation today patient actually appears to be doing excellent in regard to his wound. He's been tolerating the dressing changes without complication. Fortunately there is no signs of active infection at this time which is great news. No fevers, chills, nausea, or vomiting noted at this time. 01/24/19 on evaluation today patient actually appears to be doing quite well with regard to his ulcer. He has been tolerating the dressing changes without complication. Fortunately there's no signs of active infection at this time. Overall been very pleased with the progress that he seems to be making currently. 01/31/19-Patient returns at 1 week with apparent similarity in dimensions to the wound, with no signs of infection, he has been OLUWATOMISIN, PLUEGER.  ([id number]) changing dressings twice a day 02/08/19 upon evaluation today patient actually appears to be doing well with regard to his right Ischial ulcer. The wound is not appear to be quite as deep and seems to be making progress which is good news. With that being said I'm still reluctant to go back to the Wound VAC at this point. He's been having to change the dressings twice a day which is a little bit much in my pinion from the wound care supplies standpoint. I think that possibly attempting to utilize extras orbit may be beneficial this may also help to prevent any additional breakdown secondary to fluid retention in the wound itself. The patient is in agreement with giving this a try. 02/15/19 on evaluation today patient actually appears to be doing decently well with regard to his ulcer in the right to gluteal fold location. He's been tolerating the dressing changes without complication. Fortunately there is no signs of active infection at this time. He is able to keep the current dressing in place more effectively for a day at a time whereas before he was having a changes to to three times a day. The actions or has been helpful in this regard. Fortunately there's no signs of anything getting worse and I do feel like he showing signs of good improvement with regard to the wound bed status. 02/22/2019 patient appears to be doing very well today with regard to his ulcer in the gluteal fold. Fortunately there is no signs of active infection and he has been tolerating the dressing changes without any complication. Overall extremely pleased with how things seem to be progressing. He has much less of the hyper granular projections within the wound these have slowly been debrided away and he seems to be doing well. The wound bed is more uniform. 03/01/19 on evaluation today patient appears to be doing unfortunately about the same in regard to his gluteal ulcer. He's been tolerating the dressing  changes without complication. Fortunately there's no signs of active infection at this time. With that being said he continues to develop these hyper granular projections which I'm unsure of exactly what they are and why they are rising.  Nonetheless I explained to the patient that I do believe it would be a good idea for [place] to stand a biopsy sample for pathology to see if that can shed any light on what exactly may be going on here. Fortunately I do not see any obvious signs of infection. With that being said the patient has had a little bit more drainage this week apparently compared to last week. 03/08/2019 on evaluation today patient actually appears to look somewhat better with regard to the appearance of his wound bed at this time. This is good news. Overall I am very pleased with how things seem to have progressed just in the past week with a switch to the [facility] dressing. I think that has been beneficial for him. With that being said at this time the patient is concerned about his biopsy that I sent off last week unfortunately I do not have that report as of yet. Nonetheless we have called to obtain this and hopefully will hear back from the lab later this morning. 03/15/19 on evaluation today patient's wound actually appears to be doing okay today with regard to the overall appearance of the wound bed. He has been tolerating the dressing changes without complication he still has hyper granular tissue noted but fortunately that seems to be minimal at this point compared to some of what we've seen in the past. Nonetheless I do think that he is still having some issues currently with some of this type of granulation the biopsy and since all showed nothing more than just evidence of granulation tissue. Therefore there really is nothing different to initiate or do at this point. 03/24/19 on evaluation today patient appears to be doing a little better with regard to his ulcer. He's been  tolerating the dressing changes without complication. Fortunately there is no signs of active infection at this time. No fevers, chills, nausea, or vomiting noted at this time. I'm overall pleased with how things seem to be progressing. 03/29/2019 on evaluation today patient appears to be doing about the same in regard to his ulcer in the right gluteal fold. Unfortunately he is not seeming to make a lot of progress and the wound is somewhat stalled. There is no signs of active infection externally but I am concerned about the possibility of infection continuing in the ischial location which previously he did have infection noted. Again is not able to have an MRI so probably our best option for testing for this would be a triple phase bone scan which will detect subtle changes in the bone more so than plain film x-rays. In the past they really have not been beneficial and in fact the CT scans even have been somewhat questionable at times. Nonetheless there is no signs of systemic infection which is at least good news but again his wound is not healing at all on the predicted schedule. 04/05/19 on evaluation today patient appears to be doing well all things considering with regard to his wound and the right gluteal fold. He actually has his triple bone scan scheduled for sometime in the next couple of days. With that being said I've also been looking to other possibilities of what could be causing hyper granular tissue were looking into the bone scan again for evaluating for the risk or possibility of infection deeper and I'm also gonna go ahead and see about obtaining a deep tissue culture today to send and see if there's any evidence of infection noted on culture. He's  in agreement with that plan. 04/12/2019 on evaluation today patient presents for reevaluation here in our clinic concerning ongoing issues with his right gluteal fold ulcer. 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 It was discussed with him at that point that I think it would be appropriate PAXTON, CARLONE. ([id number]) for him to proceed with hyperbaric oxygen therapy especially in light of the fact that he is previously been on IV antibiotics at the beginning of the year for close to 3 months followed by several weeks of oral antibiotics that was all prescribed by infectious disease. He had surgical debridement around Christmas December 2019 due to an abscess and osteomyelitis of the ischial bone. Unfortunately this has not really proceeded as well as we would have liked and again we did a CT scan even a couple months ago as he cannot have an MRI secondary to having issues with both a pain pump as well as a spinal cord stimulator which prevent him from going into an MRI machine. With that being said there were chronic changes noted at the ischial tuberosity which had progressed since December 2019 there was no evidence of fluid collection on that initial CT scan. With that being said that was on January 21, 2019. I am not sure that it was a sensitive enough test as compared to an MRI and then subsequently I ordered a bone scan for him which was actually completed on 03/29/2019 and this revealed that he does indeed have positive osteomyelitis involving the right ischial tuberosity. This is adjacent to the ulcer and I think is the reason that his ulcer is not healing. Subsequently I am in a place him back on oral antibiotics today unfortunately his wound culture showed just mixed gram-negative flora with no specific findings of a predominant organism. Nonetheless being with the location I think a good broad-spectrum antibiotic for gram-negative's is a good choice at this point he is previously taken Levaquin without significant issues and I think that is an appropriate antibiotic for him at this time. 04/19/2019 on  evaluation today patient actually appears to be doing fairly well with regard to his wound. He has been tolerating the dressing changes without complication. Fortunately there is no signs of active infection and he has been taking the oral antibiotics at this time. Subsequently we did make a referral to infectious disease although Dr. Steva Ready wants the patient to be seen by general surgery first in order apparently to see if there is anything from a surgical standpoint that should be done prior to initiation of IV antibiotic therapy. Again the patient is okay with actually seeing Dr. Celine Ahr whom he has seen before we will make that referral. 04/26/2019 on evaluation today patient actually appears to be doing well with regard to his wound. He has been tolerating the dressing changes without complication. Fortunately there is no signs of active infection at this time. I do believe that the hyperbaric oxygen therapy along with the antibiotics which I prescribed at this point have been doing well for him. With that being said he has not seen Dr. Celine Ahr the surgeon yet in fact they have not been contacted for scheduling an appointment as of yet. Subsequently the patient is not on antibiotics currently by IV Dr. Steva Ready did want him to see Dr. Celine Ahr first which we are working on trying to get scheduled. We again  give the information to the patient today for Dr. Celine Ahr and her number as far as contacting their office to see about getting something scheduled. Again were looking for whether or not they recommend any surgical intervention. 05/03/2019 on evaluation today patient appears to be doing about the same with regard to his wound there may be a little bit of filling in in regard to the base of the wound but still he has quite a bit of hyper granular tissue that I am seeing today. I believe that he may may need a more aggressive sharp debridement possibly even by the surgeon or under anesthesia  in order to clear away some of the hyper granular material in order to help allow for appropriate granulation to fill in. We have made a referral to Dr. Celine Ahr unfortunately it sounds as if they did not receive the referral we contacted them today they should be get in touch with the patient. Depending on how things show from that standpoint the patient may need to see Dr. Ardyth Gal who is the infectious disease specialist although he really does not want a PICC line again. No fevers, chills, nausea, vomiting, or diarrhea. He is tolerating hyperbaric oxygen therapy very well. 05/12/2019 on evaluation today patient presents for follow-up visit concerning his right gluteal fold ulcer. He did see Dr. Celine Ahr the surgeon who previously evaluated his wound and she actually felt like he was doing quite well with regard to the wound based on what she was seen. He does seem to be responding to some degree with the oral antibiotics along with hyperbaric oxygen therapy at this point she did not see any evidence or need for further surgical intervention at this point. She recommended deferring additional or ongoing antibiotic therapy to Dr. Steva Ready at her discretion. Fortunately there is no signs of active systemic infection. No fevers, chills, nausea, vomiting, or diarrhea. 10/29; patient I am seeing really for the first time today in terms of his wound. 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 He is being treated with hyperbaric oxygen for underlying osteomyelitis most recently documented by a three-phase bone scan. He has been on Levaquin for roughly a month and he is out of these and asked me for a refill. Most recent cultures were negative although I do not think these were bone cultures. He has a very irregular surface to this wound which is almost nodular in texture. It undermines superiorly with the same nodular hyper granulated surface. I see that he was referred to  general surgery for an operative debridement although the surgeon did not agree with this I think that would have been the proper course of action in this. He has been using Hydrofera Blue. He is supposed to start a wound VAC tomorrow which is actually a reapplication of wound VAC. I think mostly last time they had trouble keeping the seal in place I hope we have better look at this this time. 05/24/2019 on evaluation today I did review patient's note from Dr. Dellia Nims where he was seen last week when I was out of the office. Subsequently it does appear that Dr. Dellia Nims was in agreement that the patient really needed debridement to clear away some of the nodular tissue in the base of the wound. The good news is he has the wound VAC initiated and some of this tissue is already starting to breakdown under the treatment of the wound VAC again because it is not really viable. Swanner,  CLEMENTS OHAYON ([id number]) Nonetheless I do believe that we can likely perform some debridement today to clear this away and hopefully the continuation of the wound VAC along with hyperbarics and antibiotic therapy will be beneficial in stopping this from reoccurring. If we get better control in the wound bed in a good spot I think we can definitely use the PriMatrix which we actually did get approval for. 05/31/2019 on evaluation today patient appears to be doing actually pretty well at this point with regard to his wound. He has been tolerating the dressing changes which is good news. Fortunately there is no signs of infection. He still has some of the hyper granular nodules noted we have not really cleared all these away and I think we can try to do a little bit more that today. I did do quite a bit last week I am hoping to get down to the base of the wound so though actually be able to heal more appropriately. I think the wound VAC is doing a good job right now with controlling the drainage and overall the patient is happy  with that. Electronic Signature(s) Signed: 05/31/2019 12:49:56 PM By: Worthy Keeler [name] Entered By: Worthy Keeler on 05/31/2019 12:49:55 KEONTAYE, BARBATO ([id number]) -------------------------------------------------------------------------------- Physical Exam Details Patient Name: [name] Date of Service: 05/31/2019 10:15 AM Medical Record Number: [id number] Patient Account Number: [id number] Date of Birth/Sex: Oct 10, 1950 (68 y.o. M) Treating RN: Montey Hora Primary Care Provider: [name] Other Clinician: [name] Referring Provider: [name] Treating Provider/Extender: [name], HOYT Weeks in Treatment: 64 Constitutional Well-nourished and well-hydrated in no acute distress. Respiratory normal breathing without difficulty. Psychiatric this patient is able to make decisions and demonstrates good insight into disease process. Alert and Oriented x 3. pleasant and cooperative. Notes Patient's wound bed currently did require some sharp debridement to remove some of the hyper granular nodules and I was able to perform debridement today without complication post debridement he did have quite a bit of bleeding I used silver nitrate and the total amount of 6 sticks in order to achieve hemostasis he was pretty much at that point at the end of treatment today before the dressing was applied and discharge. Overall I did not see any issues of anything worsening which is also good news. Electronic Signature(s) Signed: 05/31/2019 12:50:38 PM By: Worthy Keeler [name] Entered By: Worthy Keeler on 05/31/2019 12:50:37 AUDIEL, MAYLOR ([id number]) -------------------------------------------------------------------------------- Physician Orders Details Patient Name: [name] Date of Service: 05/31/2019 10:15 AM Medical Record Number: [id number] Patient Account Number: [id number] Date of Birth/Sex: 1951-06-06 (68 y.o. 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 - in office o Cleanse wound with mild soap and water Skin Barriers/Peri-Wound Care o Skin Prep Primary Wound Dressing Wound #1 Right Gluteal fold o Silver Collagen - under NPWT o Other: - in clinic - silvercel, xtrasorb, tegaderm Dressing Change Frequency Wound #1 Right Gluteal fold o Change Dressing Monday, Wednesday, Friday Follow-up Appointments Wound #1 Right Gluteal fold o Return  Appointment in 1 week. Off-Loading Wound #1 Right Gluteal fold o Turn and reposition every 2 hours Home Health Wound #1 Right Gluteal fold o [place] Nurse may visit PRN to address patientos wound care needs. o FACE TO FACE ENCOUNTER: MEDICARE and MEDICAID PATIENTS: I certify that this patient is under my care and that I had a face-to-face encounter that meets the physician face-to-face encounter requirements with this patient on this date. The encounter with the patient was in whole or in part for the following [place] ([id number]) CONDITION: (primary reason for Home Healthcare) MEDICAL NECESSITY: I certify, that based on my findings, NURSING services are a medically necessary home health service. HOME BOUND STATUS: I certify that my clinical findings support that this patient is homebound (i.e., Due to  illness or injury, pt requires aid of supportive devices such as crutches, cane, wheelchairs, walkers, the use of special transportation or the assistance of another person to leave their place of residence. There is a normal inability to leave the home and doing so requires considerable and taxing effort. Other absences are for medical reasons / religious services and are infrequent or of short duration when for other reasons). o If current dressing causes regression in wound condition, may D/C ordered dressing product/s and apply Normal Saline Moist Dressing daily until next [place] / Other MD appointment. Wiley of regression in wound condition at [phone]. o Please direct any NON-WOUND related issues/requests for orders to patient's Primary Care Physician Negative Pressure Wound Therapy Wound #1 Right Gluteal fold o Wound VAC settings at 125/130 mmHg continuous pressure. Use BLACK/GREEN foam to wound cavity. Use WHITE foam to fill any tunnel/s and/or undermining. Change VAC dressing 3 X WEEK. Change canister as indicated when full. Nurse may titrate settings and frequency of dressing changes as clinically indicated. o Home Health Nurse may d/c VAC for s/s of increased infection, significant wound regression, or uncontrolled drainage. [place] at [phone]. o Apply contact layer over base of wound. - apply prisma/silver collagen to the base of the wound o Number of foam/gauze pieces used in the dressing = Electronic Signature(s) Signed: 05/31/2019 5:03:01 PM By: Montey Hora Signed: 05/31/2019 5:47:19 PM By: Worthy Keeler [name] Previous Signature: 05/31/2019 11:25:28 AM Version By: Montey Hora Entered By: Montey Hora on 05/31/2019 13:17:40 DARRINGTON, HOHLT ([id number]) -------------------------------------------------------------------------------- Problem List Details Patient Name: [name] Date of Service: 05/31/2019 10:15 AM Medical Record Number: [id number] Patient Account Number: [id number] Date of Birth/Sex: 08-21-1950 (68 y.o. M) Treating RN: Montey Hora Primary Care Provider: [name] Other Clinician: [name] Referring Provider: [name] Treating Provider/Extender: [name], HOYT Weeks in Treatment: 66 Active Problems ICD-10 Evaluated Encounter Code Description Active Date Today Diagnosis L89.314 Pressure ulcer of right buttock, stage 4 11/30/2017 No Yes M86.68 Other chronic osteomyelitis, other site 04/08/2019 No Yes L03.317 Cellulitis of buttock 06/21/2018 No Yes G82.20 Paraplegia, unspecified 11/30/2017 No Yes S34.109S Unspecified injury to unspecified level of lumbar spinal cord, 11/30/2017 No Yes sequela I10 Essential (primary) hypertension 11/30/2017 No Yes Inactive Problems Resolved Problems Electronic Signature(s) Signed: 05/31/2019 10:32:54 AM By: Worthy Keeler [name] Entered By: Worthy Keeler on 05/31/2019 10:32:54 [name] ([id number]) -------------------------------------------------------------------------------- Progress Note Details Patient Name: [name] Date of Service: 05/31/2019 10:15 AM Medical Record Number: [id number] Patient Account Number: [id number] Date of Birth/Sex: Nov 28, 1950 (68 y.o. 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 He states that  there is no history of diabetes that he is aware of. He does have issues with his prostate and is currently receiving treatment for this by way of oral medication. With that being said I do not have a lot of details in that regard. Nonetheless the patient presents today as a result of having been referred to [place] by another provider initially home health was set to come out and take care of his wound although due to the fact that he apparently drives he's not able to receive home health. His wife is therefore trying to help take care of this wound within although they have been struggling with what exactly to do at this point. She states that she can do some things but she is definitely not a nurse and does have some issues with looking at blood. The good news is the wound does not appear to be too deep and is fairly superficial at this point. There is no slough noted there is some nonviable skin noted around the surface of the wound and the perimeter at this point. The central portion of the wound appears to be very good with a dermal layer noted this does not appear to be again deep enough to extend it to subcutaneous tissue at this point. 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 In the pump he has Dilaudid and baclofen. 12/07/17 on evaluation today patient presents for follow-up concerning his ongoing lower back thigh ulcer on the right. He states that he did not get the supplies ordered and therefore has not really been able to perform the dressing changes as directed exactly. His wife was able to get some Boarder Foam Dressing's from the drugstore and subsequently has been using hydrogel which did help to a degree in the wound does appear to be able smaller. There is actually more drainage this week noted than previous. 12/21/17 on evaluation today patient appears to be doing rather well in  regard to his right gluteal ulcer. He has been tolerating the dressing changes without complication. There does not appear to be any evidence of infection at this point in time. Overall the wound does seem to be making some progress as far as the edges are concerned there's not as much in the way of overlapping of the external wound edges and he has a good epithelium to wound bed border for the most part. This however is not true right at the 12 o'clock location over the span of a little over a centimeters which actually will require debridement today to clean this away and hopefully allow it to continue to heal more appropriately. 12/28/17 on evaluation today patient appears to be doing rather well in regard to his ulcer in the left gluteal region. He's been tolerating the dressing changes without complication. Apparently he has had some difficulty getting his dressing material. Apparently there's been some confusion with ordering we're gonna check  into this. Nonetheless overall he's been showing signs of improvement which is good news. Debridement is not required today. 01/04/18 on evaluation today patient presents for follow-up concerning his right gluteal ulcer. He has been tolerating the dressing changes fairly well. On inspection today it appears he may actually have some maceration them concerned about the fact that he may be developing too much moisture in and around the wound bed which can cause delay in healing. With that being said he unfortunately really has not showed significant signs of improvement since last week's evaluation in fact this may even be just the little bit/slightly larger. Nonetheless he's been having a lot of discomfort I'm not sure this is even related to the wound as he has no pain when I'm to breeding or otherwise cleaning the wound during evaluation today. Nonetheless this is something that we did recommend he talked to his pain specialist concerning. 01/11/18 on  evaluation today patient appears to be doing better in regard to his ulceration. He has been tolerating the dressing Askari, Eileen J. ([id number]) changes without complication. With that being said overall there's no evidence of infection which is good news. The only thing is he did receive the hatch affair blue classic versus the ready nonetheless I feel like this is perfectly fine and appears to have done well for him over the past week. 01/25/18 on evaluation today patient's wound actually appears to be a little bit larger than during the last evaluation. The good news is the majority of the wound edges actually appear to be fairly firmly attached to the wound bed unfortunately again we're not really making progress in regard to the size. Roughly the wound is about the same size as when I first saw him although again the wound margin/edges appear to be much better. 02/01/18 on evaluation today patient actually appears to be doing very well in regard to his wound. Applying the Prisma dry does seem to be better although he does still have issues with slow progression of the wound. There was a slight improvement compared to last week's measurements today. Nonetheless I have been considering other options as far as the possibility of Theraskin or even a snap vac. In general I'm not sure that the Theraskin due to location of the wound would be a very good idea. Nonetheless I do think that a snap vac could be a possibility for the patient and in fact I think this could even be an excellent way to manage the wound possibly seeing some improvement in a very rapid fashion here. Nonetheless this is something that we would need to get approved and I did have a lengthy conversation with the patient about this today. 02/08/18 on evaluation today patient appears to be doing a little better in regard to his ulcer. He has been tolerating the dressing changes without complication. Fortunately despite the fact that  the wound is a little bit smaller it's not significantly so unfortunately. We have discussed the possibility of a snap vac we did check with insurance this is actually covered at this point. Fortunately there does not appear to be any sign of infection. Overall I'm fairly pleased with how things seem to be appearing at this point. 02/15/18 on evaluation today patient appears to be doing rather well in regard to his right gluteal ulcer. Unfortunately the snap vac did not stay in place with his sheer and friction this came loose and did not seem to maintain seal very well. He worked for  about two days and it did seem to do very well during that time according to his wife but in general this does not seem to be something that's gonna be beneficial for him long-term. I do believe we need to go back to standard dressings to see if we can find something that will be of benefit. 03/02/18- He is here in follow up evaluation; there is minimal change in the wound. He will continue with the same treatment plan, would consider changing to iodosrob/iodoflex if ulcer continues to to plateau. He will follow up next week 03/08/18 on evaluation today patient's wound actually appears to be about the same size as when I previously saw him several weeks back. Unfortunately he does have some slightly dark discoloration in the central portion of the wound which has me concerned about pressure injury. I do believe he may be sitting for too long a period of time in fact he tells me that "I probably sit for much too long". He does have some Slough noted on the surface of the wound and again as far as the size of the wound is concerned I'm really not seeing anything that seems to have improved significantly. 03/15/18 on evaluation today patient appears to be doing fairly well in regard to his ulcer. The wound measured pretty much about the same today compared to last week's evaluation when looking at his graph. With that being  said the area of bruising/deep tissue injury that was noted last week I do not see at this point. He did get a new cushion fortunately this does seem to be have been of benefit in my pinion. It does appear that he's been off of this more which is good news as well I think that is definitely showing in the overall wound measurements. With that being said I do believe that he needs to continue to offload I don't think that the fact this is doing better should be or is going to allow him to not have to offload and explain this to him as well. Overall he seems to be in agreement the plan I think he understands. The overall appearance of the wound bed is improved compared to last week I think the Iodoflex has been beneficial in that regard. 03/29/18 on evaluation today patient actually appears to be doing rather well in regard to his wound from the overall appearance standpoint he does have some granulation although there's some Slough on the surface of the wound noted as well. With that being said he unfortunately has not improved in regard to the overall measurement of the wound in volume or in size. I did have a discussion with him very specifically about offloading today. He actually does work although he mainly is just sitting throughout the day. He tells me he offloads by "lifting himself up for 30 seconds off of his chair occasionally" purchase from [employer] which does seem to have helped. And he has a new cushion that he with that being said he's also able to stand some for a very short period of time but not significant enough I think to provide appropriate offloading. I think the biggest issue at this point with the wound and the fact is not healing as quickly as we would like is due to the fact that he is really not able to appropriately offload while at work. He states the beginning after his injury he actually had a bed at his job that he could lay on in  order to offload and that does  seem to have been of help back at that time. Nonetheless he had not done this in quite some time unfortunately. I think that could be helpful for him this is something I would like for him to look into. EMERIL, BELLMAN ([id number]) 04/05/18 on evaluation today patient actually presents for follow-up concerning his right gluteal ulcer. Again he really is not significantly improved even compared to last week. He has been tolerating the dressing changes without complication. With that being said fortunately there appears to be no evidence of infection at this time. He has been more proactive in trying to offload. 04/12/18 on evaluation today patient actually appears to be doing a little better in regard to his wound and the right gluteal fold region. He's been tolerating the dressing changes since removing the oasis without complication. However he was having a lot of burning initially with the oasis in place. He's unsure of exactly why this was given so much discomfort but he assumes that it was the oasis itself causing the problem. Nonetheless this had to be removed after about three days in place although even those three days seem to have made a fairly good improvement in regard to the overall appearance of the wound bed. In fact is the first time that he's made any improvement from the standpoint of measurements in about six weeks. He continues to have no discomfort over the area of the wound itself which leads me to wonder why he was having the burning with the oasis when he does not even feel the actual debridement's themselves. I am somewhat perplexed by this. 04/19/18 on evaluation today patient's wound actually appears to be showing signs of epithelialization around the edge of the wound and in general actually appears to be doing better which is good news. He did have the same burning after about three days with applying the Endoform last week in the same fashion that I would generally  apply a skin substitute. This seems to indicate that it's not the oasis to cause the problem but potentially the moisture buildup that just causes things to burn or there may be some other reaction with the skin prep or Steri-Strips. Nonetheless I'm not sure that is gonna be able to tolerate any skin substitute for a long period of time. The good news is the wound actually appears to be doing better today compared to last week and does seem to finally be making some progress. 04/26/18 on evaluation today patient actually appears to be doing rather well in regard to his ulcer in the right gluteal fold. He has been tolerating the dressing changes without complication which is good news. The Endoform does seem to be helping although he was a little bit more macerated this week. This seems to be an ongoing issue with fluid control at this point. Nonetheless I think we may be able to add something like Drawtex to help control the drainage. 05/03/18 on evaluation today patient appears to actually be doing better in regard to the overall appearance of his wound. He has been tolerating the dressing changes without complication. Fortunately there appears to be no evidence of infection at this time. I really feel like his wound has shown signs as of today of turning around last week I thought so as well and definitely he could be seen in this week's overall appearance and measurements. In general I'm very pleased with the fact that he finally seems to be making  a steady but sure progress. The patient likewise is very pleased. 05/17/18 on evaluation today patient appears to be doing more poorly unfortunately in regard to his ulcer. He has been tolerating the dressing changes without complication. With that being said he tells me that in the past couple of days he and his wife have noticed that we did not seem to be doing quite as well is getting dark near the center. Subsequently upon evaluation today the wound  actually does appear to be doing worse compared to previous. He has been tolerating the dressing changes otherwise and he states that he is not been sitting up anymore than he was in the past from what he tells me. Still he has continued to work he states "I'm tired of dealing with this and if I have to just go home and lay in the bed all the time that's what I'll do". Nonetheless I am concerned about the fact that this wound does appear to be deeper than what it was previous. 05/24/18 upon evaluation today patient actually presents after having been in the hospital due to what was presumed to be sepsis secondary to the wound infection. He had an elevated white blood cell count between 14 and 15. With that being said he does seem to be doing somewhat better now. His wound still is giving him some trouble nonetheless and he is obviously concerned about the fact likely talked about that this does seem to go more deeply than previously noted. I did review his wound culture which showed evidence of Staphylococcus aureus him and group B strep. Nonetheless he is on antibiotics, Levaquin, for this. Subsequently I did review his intake summary from the hospital as well. I also did look at the CT of the lumbar spine with contrast that was performed which showed no bone destruction to suggest lumbar disguises/osteomyelitis or sacral osteomyelitis. There was no paraspinal abscess. Nonetheless it appears this may have been more of just a soft tissue infection at this point which is good news. He still is nonetheless concerned about the wound which again I think is completely reasonable considering everything he's been through recently. 05/31/18 on evaluation today on evaluation today patient actually appears to be showing signs of his wound be a little bit deeper than what I would like to see. Fortunately he does not show any signs of significant infection although his temperature was 99 today he states he's  been checking this at home and has not been elevated. Nonetheless with the undermining that I'm seeing at this point I am becoming more concerned about the wound I do think that offloading is a key factor here that is preventing the speedy recovery at this point. There does not appear to be any evidence of again over infection noted. He's been using Santyl currently. ARHAAN, MACEWAN ([id number]) 06/07/18 the patient presents today for follow-up evaluation regarding the left ulcer in the gluteal region. He has been tolerating the Wound VAC fairly well. He is obviously very frustrated with this he states that to mean is really getting in his way. There does not appear to be any evidence of infection at this time he does have a little bit of odor I do not necessarily associate this with infection just something that we sometimes notice with Wound VAC therapy. With that being said I can definitely catch a tone of discontentment overall in the patient's demeanor today. This when he was previously in the hospital an CT scan was done  of the lumbar region which did not reveal any signs of osteomyelitis. With that being said the pelvis in particular was not evaluated distinctly which means he could still have some osteonecrosis I. Nonetheless the Wound VAC was started on Thursday I do want to get this little bit more time before jumping to a CT scan of the pelvis although that is something that I might would recommend if were not see an improvement by that time. 06/14/18 on evaluation today patient actually appears to be doing about the same in regard to his right gluteal ulcer. Again he did have a CT scan of the lumbar spine unfortunately this did not include the pelvis. Nonetheless with the depth of the wound that I'm seeing today even despite the fact that I'm not seeing any evidence of overt cellulitis I believe there's a good chance that we may be dealing with osteomyelitis somewhere in the right  Ischial region. No fevers, chills, nausea, or vomiting noted at this time. 06/21/18 on evaluation today patient actually appears to be doing about the same with regard to his wound. The tunnel at 6 o'clock really does not appear to be any deeper although it is a little bit wider. I think at this point you may want to start packing this with white phone. Unfortunately I have not got approval for the CT scan of the pelvis as of yet due to the fact that Medicare apparently has been denied it due to the diagnosis codes not being appropriate according to Medicare for the test requested. With that being said the patient cannot have an MRI and therefore this is the only option that we have as far as testing is concerned. The patient has had infection and was on antibiotics and been added code for cellulitis of the bottom to see if this will be appropriate for getting the test approved. Nonetheless I'm concerned about the infection have been spread deeper into the Ischial region. 06/28/18 on evaluation today patient actually appears to be doing rather well all things considered in regard to the right gluteal ulcer. He has been tolerating the dressing changes without complication. With that being said the Wound VAC he states does have to be replaced almost every day or at least reinforced unfortunately. Patient actually has his CT scan later this morning we should have the results by tomorrow. 07/05/18 on evaluation today patient presents for follow-up concerning his right Ischial ulcer. He did see the surgeon Dr. Lysle Pearl last week. They were actually very happy with him and felt like he spent a tremendous amount of time with them as far as discussing his situation was concerned. In the end Dr. Lysle Pearl did contact me as well and determine that he would not recommend any surgical intervention at this point as he felt like it would not be in the patient's best interest based on what he was seeing. He recommended a  referral to infectious disease. Subsequently this is something that Dr. Ines Bloomer office is working on setting up for the patient. As far as evaluation today is concerned the patient's wound actually appears to be worse at this point. I am concerned about how things are progressing and specifically about infection. I do not feel like it's the deeper but the area of depth is definitely widening which does have me concerned. No fevers, chills, nausea, or vomiting noted at this time. I think that we do need initiate antibiotic therapy the patient has an allow allergy to amoxicillin/penicillin he states that  he gets a rash since childhood. Nonetheless she's never had the issues with Catholics or cephalosporins in general but he is aware of. 07/27/18 on evaluation today patient presents following admission to the hospital on 07/09/18. He was subsequently discharged on 07/20/18. On 07/15/18 the patient underwent irrigation and debridement was soft tissue biopsy and bone biopsy as well as placement of a Wound VAC in the OR by Dr. Celine Ahr. During the hospital course the patient was placed on a Wound VAC and recommended follow up with surgery in three weeks actually with Dr. Delaine Lame who is infectious disease. The patient was on vancomycin during the hospital course. 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 He also had a bone culture which revealed evidence of methicillin-resistant staph aureus. He is updated CT scan 07/09/18 reveals that he had progression of the which was performed on wound to breakdown down to the trochanter where he actually had irregularities there as well suggestive of osteomyelitis. This was a change just since 9 December when we last performed a CT scan. Obviously this one had gone downhill quite significantly and rapidly. At this point upon evaluation I feel like in general the patient's wound seems to be doing fairly well all things considered  upon my evaluation today. Obviously this is larger and deeper than what I previously evaluated but at the same time he seems to be making some progress as far as the appearance of the granulation tissue is concerned. I'm happy in that regard. No fevers, chills, nausea, or vomiting noted at this time. He is on IV vancomycin and Rocephin at the facility. He is currently in [facility]. 08/03/18 upon evaluation today patient's wound appears to be doing better in regard to the overall appearance at this point in time. Fortunately he's been tolerating the Wound VAC without complication and states that the facility has been taking DJIBRIL, DINGLEDINE. ([id number]) excellent care of the wound site. Overall I see some Slough noted on the surface which I am going to attempt sharp debridement today of but nonetheless other than this I feel like he's making progress. 08/09/18 on evaluation today patient's wound appears to be doing much better compared to even last week's evaluation. Do believe that the Wound VAC is been of great benefit for him. He has been tolerating the dressing changes that is the Wound VAC without any complication and he has excellent granulation noted currently. There is no need for sharp debridement at this point. 08/16/18 on evaluation today patient actually appears to be doing very well in regard to the wound in the right gluteal fold region. This is showing signs of progress and again appears to be very healthy which is excellent news. Fortunately there is no sign of active infection by way of odor or drainage at this point. Overall I'm very pleased with how things stand. He seems to be tolerating the Wound VAC without complication. 08/23/18 on evaluation today patient actually appears to be doing better in regard to his wound. He has been tolerating the Wound VAC without complication and in fact it has been collecting a significant amount of drainage which I think is good  news especially considering how the wound appears. Fortunately there is no signs of infection at this time definitely nothing appears to be worse which is good news. He has not been started on the Bactrim and Flagyl that was recommended by Dr. Delaine Lame yet. I did actually contact her office this morning in order  to check and see were things are that regard their gonna be calling me back. 08/30/18 on evaluation today patient actually appears to show signs of excellent improvement today compared to last evaluation. The undermining is getting much better the wound seems to be feeling quite nicely and I'm very pleased that the granulation in general. With that being said overall I feel like the patient has made excellent progress which is great news. No fevers, chills, nausea, or vomiting noted at this time. 09/06/18 on evaluation today patient actually appears to be doing rather well in regard to his right gluteal ulcer. This is showing signs of improvement in overall I'm very pleased with how things seem to be progressing. The patient likewise is please. Overall I see no evidence of infection he is about to complete his oral antibiotic regimen which is the end of the antibiotics for him in just about three days. 09/13/18 on evaluation today patient's right Ischial ulcer appears to be showing signs of continued improvement which is excellent news. He's been tolerating the dressing changes without complication. Fortunately there's no signs of infection and the wound that seems to be doing very well. 09/28/18 on evaluation today patient appears to be doing rather well in regard to his right Ischial ulcer. He's been tolerating the Wound VAC without complication he knows there's much less drainage than there used to be this obviously is not a bad thing in my pinion. There's no evidence of infection despite the fact is but nothing about it now for several weeks. 10/04/18 on evaluation today patient appears  to be doing better in regard to his right Ischial wound. He has been tolerating the Wound VAC without complication and I do believe that the silver nitrate last week was beneficial for him. Fortunately overall there's no evidence of active infection at this time which is great news. No fevers, chills, nausea, or vomiting noted at this time. 10/11/18 on evaluation today patient actually appears to be doing rather well in regard to his Ischial ulcer. He's been tolerating the Wound VAC still without complication I feel like this is doing a good job. No fevers, chills, nausea, or vomiting noted at this time. 11/01/18 on evaluation today patient presents after having not been seen in our clinic for several weeks secondary to the fact that he was on evaluation today patient presents after having not been seen in our clinic for several weeks secondary to the fact that he was in a skilled nursing facility which was on lockdown currently due to the covert 19 national emergency. Subsequently he was discharged from the facility on this past Friday and subsequently made an appointment to come in to see yesterday. Fortunately there's no signs of active infection at this time which is good news and overall he does seem to have made progress since I last saw. Overall I feel like things are progressing quite nicely. The patient is having no pain. 11/08/18 on evaluation today patient appears to be doing okay in regard to his right gluteal ulcer. He has been utilizing a Wound VAC home health this changing this at this point since he's home from the skilled nursing facility. Fortunately there's no signs of obvious active infection at this time. Unfortunately though there's no obvious active infection he is having some maceration and his wife states that when the sheets of the Wound VAC office on Sunday when it broke seal that he ended up having significant issues with some smell as well there concerned  about the  possibility of infection. Fortunately there's No fevers, chills, nausea, or vomiting noted at this time. ZISHA, MONTJOY ([id number]) 11/15/18 on evaluation today patient actually appears to be doing well in regard to his right gluteal ulcer. He has been tolerating the dressing changes without complication. Specifically the Wound VAC has been utilized up to this point. Fortunately there's no signs of infection and overall I feel like he has made progress even since last week when I last saw him. I'm actually fairly happy with the overall appearance although he does seem to have somewhat of a hyper granular overgrowth in the central portion of the wound which I think may require some sharp debridement to try flatness out possibly utilizing chemical cauterization following. 11/23/18 on evaluation today patient actually appears to be doing very well in regard to his sacral ulcer. He seems to be showing signs of improvement with good granulation. With that being said he still has the small area of hyper granulation right in the central portion of the wound which I'm gonna likely utilize silver nitrate on today. Subsequently he also keeps having a leak at the 6 o'clock location which is unfortunate we may be able to help out with some suggestions to try to prevent this going forward. Fortunately there's no signs of active infection at this time. 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 He's been tolerating the dressing changes without complication. Fortunately there's no signs of active infection at this time. I've been rather pleased with how things have progressed there still some evidence of pressure getting to the area with some redness right around the immediate wound opening. Nonetheless other than this I'm not seeing any significant complications or issues the wound is somewhat hyper granular.  Upon discussing with the patient and his wife today I'm not sure that the wound is being packed to the base with the foam at this point. And if it's not been packed fully that may be part of the reason why is not seen as much improvement as far as the granulation from the base out. Again we do not want pack too tightly but we need some of the firm to get to the base of the wound. I discussed this with patient and his wife today. 12/06/18 on evaluation today patient appears to be doing well in regard to his right gluteal pressure ulcer. He's been tolerating the dressing changes without complication. Fortunately there's no signs of active infection. He still has some hyper granular tissue and I do think it would be appropriate to continue with the chemical cauterization as of today. 12/16/18 on evaluation today patient actually appears to be doing okay in regard to his right gluteal ulcer. He is been tolerating the dressing changes without complication including the Wound VAC. Overall I feel like nothing seems to be worsening I do feel like that the hyper granulation buds in the central portion of the wound have improved to some degree with the silver nitrate. We will have to see how things continue to progress. 12/20/18 on evaluation today patient actually appears to be doing much worse in my pinion even compared to last week's evaluation. Unfortunately as opposed to showing any signs of improvement the areas of hyper granular tissue in the central portion of the wound seem to be getting worse. Subsequently the wound bed itself also seems to be getting deeper even compared  to last week which is both unusual as well as concerning since prior he had been shown signs of improvement. Nonetheless I think that the issue could be that he's actually having some difficulty in issues with a deeper infection. There's no external signs of infection but nonetheless I am more worried about the internal, osteomyelitis,  that could be restarting. He has not been on antibiotics for some time at this point. I think that it may be a good idea to go ahead and started back on an antibiotic therapy while we wait to see what the testing shows. 12/27/18 on evaluation today patient presents for follow-up concerning his left gluteal fold wound. Fortunately he appears to be doing well today. I did review the CT scan which was negative for any signs of osteomyelitis or acute abnormality this is excellent news. Overall I feel like the surface of the wound bed appears to be doing significantly better today compared to previously noted findings. There does not appear any signs of infection nor does he have any pain at this time. 01/03/19 on evaluation today patient actually appears to be doing quite well in regard to his ulcer. Post debridement last week he really did not have too much bleeding which is good news. Fortunately today this seems to be doing some better but we still has some of the hyper granular tissue noted in the base of the wound which is gonna require sharp debridement today as well. Overall I'm pleased with how things seem to be progressing since we switched away from the Wound VAC I think he is making some progress. 01/10/19 on evaluation today patient appears to be doing better in regard to his right gluteal fold ulcer. He has been tolerating the dressing changes without complication. The debridement to seem to be helping with current away some of the poor hyper granular tissue bugs throughout the region of his gluteal fold wound. He's been tolerating the dressing changes otherwise without complication which is great news. No fevers, chills, nausea, or vomiting noted at this time. 01/17/19 on evaluation today patient actually appears to be doing excellent in regard to his wound. He's been tolerating the dressing changes without complication. Fortunately there is no signs of active infection at this time which is  great news. No fevers, chills, nausea, or vomiting noted at this time. MACALEB, SWINEA ([id number]) 01/24/19 on evaluation today patient actually appears to be doing quite well with regard to his ulcer. He has been tolerating the dressing changes without complication. Fortunately there's no signs of active infection at this time. Overall been very pleased with the progress that he seems to be making currently. 01/31/19-Patient returns at 1 week with apparent similarity in dimensions to the wound, with no signs of infection, he has been changing dressings twice a day 02/08/19 upon evaluation today patient actually appears to be doing well with regard to his right Ischial ulcer. The wound is not appear to be quite as deep and seems to be making progress which is good news. With that being said I'm still reluctant to go back to the Wound VAC at this point. He's been having to change the dressings twice a day which is a little bit much in my pinion from the wound care supplies standpoint. I think that possibly attempting to utilize extras orbit may be beneficial this may also help to prevent any additional breakdown secondary to fluid retention in the wound itself. The patient is in agreement with giving this a  try. 02/15/19 on evaluation today patient actually appears to be doing decently well with regard to his ulcer in the right to gluteal fold location. He's been tolerating the dressing changes without complication. Fortunately there is no signs of active infection at this time. He is able to keep the current dressing in place more effectively for a day at a time whereas before he was having a changes to to three times a day. The actions or has been helpful in this regard. Fortunately there's no signs of anything getting worse and I do feel like he showing signs of good improvement with regard to the wound bed status. 02/22/2019 patient appears to be doing very well today with regard to his ulcer in  the gluteal fold. Fortunately there is no signs of active infection and he has been tolerating the dressing changes without any complication. Overall extremely pleased with how things seem to be progressing. He has much less of the hyper granular projections within the wound these have slowly been debrided away and he seems to be doing well. The wound bed is more uniform. 03/01/19 on evaluation today patient appears to be doing unfortunately about the same in regard to his gluteal ulcer. He's been tolerating the dressing changes without complication. Fortunately there's no signs of active infection at this time. With that being said he continues to develop these hyper granular projections which I'm unsure of exactly what they are and why they are rising. Nonetheless I explained to the patient that I do believe it would be a good idea for [place] to stand a biopsy sample for pathology to see if that can shed any light on what exactly may be going on here. Fortunately I do not see any obvious signs of infection. With that being said the patient has had a little bit more drainage this week apparently compared to last week. 03/08/2019 on evaluation today patient actually appears to look somewhat better with regard to the appearance of his wound bed at this time. This is good news. Overall I am very pleased with how things seem to have progressed just in the past week with a switch to the [facility] dressing. I think that has been beneficial for him. With that being said at this time the patient is concerned about his biopsy that I sent off last week unfortunately I do not have that report as of yet. Nonetheless we have called to obtain this and hopefully will hear back from the lab later this morning. 03/15/19 on evaluation today patient's wound actually appears to be doing okay today with regard to the overall appearance of the wound bed. He has been tolerating the dressing changes without complication he  still has hyper granular tissue noted but fortunately that seems to be minimal at this point compared to some of what we've seen in the past. Nonetheless I do think that he is still having some issues currently with some of this type of granulation the biopsy and since all showed nothing more than just evidence of granulation tissue. Therefore there really is nothing different to initiate or do at this point. 03/24/19 on evaluation today patient appears to be doing a little better with regard to his ulcer. He's been tolerating the dressing changes without complication. Fortunately there is no signs of active infection at this time. No fevers, chills, nausea, or vomiting noted at this time. I'm overall pleased with how things seem to be progressing. 03/29/2019 on evaluation today patient appears to be  doing about the same in regard to his ulcer in the right gluteal fold. Unfortunately he is not seeming to make a lot of progress and the wound is somewhat stalled. There is no signs of active infection externally but I am concerned about the possibility of infection continuing in the ischial location which previously he did have infection noted. Again is not able to have an MRI so probably our best option for testing for this would be a triple phase bone scan which will detect subtle changes in the bone more so than plain film x-rays. In the past they really have not been beneficial and in fact the CT scans even have been somewhat questionable at times. Nonetheless there is no signs of systemic infection which is at least good news but again his wound is not healing at all on the predicted schedule. 04/05/19 on evaluation today patient appears to be doing well all things considering with regard to his wound and the right gluteal fold. He actually has his triple bone scan scheduled for sometime in the next couple of days. With that being said I've also been looking to other possibilities of what could be  causing hyper granular tissue were looking into the bone scan again for evaluating for the risk or possibility of infection deeper and I'm also gonna go ahead and see about obtaining a deep tissue Broman, Trisha J. ([id number]) culture today to send and see if there's any evidence of infection noted on culture. He's in agreement with that plan. 04/12/2019 on evaluation today patient presents for reevaluation here in our clinic concerning ongoing issues with his right gluteal fold ulcer. 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 It was discussed with him at that point that I think it would be appropriate for him to proceed with hyperbaric oxygen therapy especially in light of the fact that he is previously been on IV antibiotics at the beginning of the year for close to 3 months followed by several weeks of oral antibiotics that was all prescribed by infectious disease. He had surgical debridement around Christmas December 2019 due to an abscess and osteomyelitis of the ischial bone. Unfortunately this has not really proceeded as well as we would have liked and again we did a CT scan even a couple months ago as he cannot have an MRI secondary to having issues with both a pain pump as well as a spinal cord stimulator which prevent him from going into an MRI machine. With that being said there were chronic changes noted at the ischial tuberosity which had progressed since December 2019 there was no evidence of fluid collection on that initial CT scan. With that being said that was on January 21, 2019. I am not sure that it was a sensitive enough test as compared to an MRI and then subsequently I ordered a bone scan for him which was actually completed on 03/29/2019 and this revealed that he does indeed have positive osteomyelitis involving the right ischial tuberosity. This is adjacent to  the ulcer and I think is the reason that his ulcer is not healing. Subsequently I am in a place him back on oral antibiotics today unfortunately his wound culture showed just mixed gram-negative flora with no specific findings of a predominant organism. Nonetheless being with the location I think a good broad-spectrum antibiotic for gram-negative's is a good choice at this  point he is previously taken Levaquin without significant issues and I think that is an appropriate antibiotic for him at this time. 04/19/2019 on evaluation today patient actually appears to be doing fairly well with regard to his wound. He has been tolerating the dressing changes without complication. Fortunately there is no signs of active infection and he has been taking the oral antibiotics at this time. Subsequently we did make a referral to infectious disease although Dr. Steva Ready wants the patient to be seen by general surgery first in order apparently to see if there is anything from a surgical standpoint that should be done prior to initiation of IV antibiotic therapy. Again the patient is okay with actually seeing Dr. Celine Ahr whom he has seen before we will make that referral. 04/26/2019 on evaluation today patient actually appears to be doing well with regard to his wound. He has been tolerating the dressing changes without complication. Fortunately there is no signs of active infection at this time. I do believe that the hyperbaric oxygen therapy along with the antibiotics which I prescribed at this point have been doing well for him. With that being said he has not seen Dr. Celine Ahr the surgeon yet in fact they have not been contacted for scheduling an appointment as of yet. Subsequently the patient is not on antibiotics currently by IV Dr. Steva Ready did want him to see Dr. Celine Ahr first which we are working on trying to get scheduled. We again give the information to the patient today for Dr. Celine Ahr and her number as  far as contacting their office to see about getting something scheduled. Again were looking for whether or not they recommend any surgical intervention. 05/03/2019 on evaluation today patient appears to be doing about the same with regard to his wound there may be a little bit of filling in in regard to the base of the wound but still he has quite a bit of hyper granular tissue that I am seeing today. I believe that he may may need a more aggressive sharp debridement possibly even by the surgeon or under anesthesia in order to clear away some of the hyper granular material in order to help allow for appropriate granulation to fill in. We have made a referral to Dr. Celine Ahr unfortunately it sounds as if they did not receive the referral we contacted them today they should be get in touch with the patient. Depending on how things show from that standpoint the patient may need to see Dr. Ardyth Gal who is the infectious disease specialist although he really does not want a PICC line again. No fevers, chills, nausea, vomiting, or diarrhea. He is tolerating hyperbaric oxygen therapy very well. 05/12/2019 on evaluation today patient presents for follow-up visit concerning his right gluteal fold ulcer. He did see Dr. Celine Ahr the surgeon who previously evaluated his wound and she actually felt like he was doing quite well with regard to the wound based on what she was seen. He does seem to be responding to some degree with the oral antibiotics along with hyperbaric oxygen therapy at this point she did not see any evidence or need for further surgical intervention at this point. She recommended deferring additional or ongoing antibiotic therapy to Dr. Steva Ready at her discretion. Fortunately there is no signs of active systemic infection. No fevers, chills, nausea, vomiting, or diarrhea. 10/29; patient I am seeing really for the first time today in terms of his wound. 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 He is being treated with hyperbaric oxygen for underlying osteomyelitis most recently documented by a three-phase bone scan. He has been on Levaquin for roughly a month and he is out of these and asked me for a refill. Most recent cultures were negative although I do not think these were bone cultures. He has a very irregular surface to this wound which is almost nodular in texture. It undermines superiorly with the same nodular hyper granulated surface. I see that he was referred to general surgery for an operative debridement although the surgeon did not agree with this I think that would have been the proper course of action in this. He has been using Hydrofera Blue. He is supposed to start a wound VAC tomorrow which is actually a reapplication of wound VAC. I think mostly last time they had trouble keeping the seal in place I hope we have better look at this this time. CARMYNE, RUDY ([id number]) 05/24/2019 on evaluation today I did review patient's note from Dr. Dellia Nims where he was seen last week when I was out of the office. Subsequently it does appear that Dr. Dellia Nims was in agreement that the patient really needed debridement to clear away some of the nodular tissue in the base of the wound. The good news is he has the wound VAC initiated and some of this tissue is already starting to breakdown under the treatment of the wound VAC again because it is not really viable. Nonetheless I do believe that we can likely perform some debridement today to clear this away and hopefully the continuation of the wound VAC along with hyperbarics and antibiotic therapy will be beneficial in stopping this from reoccurring. If we get better control in the wound bed in a good spot I think we can definitely use the PriMatrix which we actually did get approval for. 05/31/2019 on evaluation today patient appears to be doing actually pretty well at this point with regard to his wound.  He has been tolerating the dressing changes which is good news. Fortunately there is no signs of infection. He still has some of the hyper granular nodules noted we have not really cleared all these away and I think we can try to do a little bit more that today. I did do quite a bit last week I am hoping to get down to the base of the wound so though actually be able to heal more appropriately. I think the wound VAC is doing a good job right now with controlling the drainage and overall the patient is happy with that. Patient History Information obtained from Patient. Family History Hypertension - Father, Stroke - Mother, No family history of Cancer, Diabetes, Heart Disease, Kidney Disease, Lung Disease, Seizures, Thyroid Problems, Tuberculosis. Social History Never smoker, Marital Status - Married, Alcohol Use - Never, Drug Use - No History, Caffeine Use - Daily. Medical History Eyes Patient has history of Cataracts - both removed Denies history of Glaucoma, Optic Neuritis Ear/Nose/Mouth/Throat Denies history of Chronic sinus problems/congestion, Middle ear problems Hematologic/Lymphatic Denies history of Anemia, Hemophilia, Human Immunodeficiency Virus, Lymphedema Respiratory Denies history of Aspiration, Asthma, Chronic Obstructive Pulmonary Disease (COPD), Pneumothorax, Sleep Apnea, Tuberculosis Cardiovascular Patient has history of Hypertension - takes medication Denies history of Angina, Arrhythmia, Congestive Heart Failure, Coronary Artery Disease, Deep Vein Thrombosis, Hypotension, Myocardial Infarction, Peripheral Arterial Disease, Peripheral Venous Disease, Phlebitis, Vasculitis Gastrointestinal Denies history of Cirrhosis , Colitis, Crohn s, Hepatitis A, Hepatitis B, Hepatitis C Endocrine Denies history of  Type I Diabetes, Type II Diabetes Genitourinary Denies history of End Stage Renal Disease Immunological Denies history of Lupus Erythematosus, Raynaud s,  Scleroderma Integumentary (Skin) Denies history of History of Burn, History of pressure wounds Musculoskeletal Denies history of Gout, Rheumatoid Arthritis, Osteoarthritis, Osteomyelitis Neurologic Patient has history of Paraplegia - waist down Denies history of Dementia, Neuropathy, Quadriplegia, Seizure Disorder Oncologic SAVIER, KADLEC ([id number]) Denies history of Received Chemotherapy, Received Radiation Psychiatric Denies history of Anorexia/bulimia, Confinement Anxiety Medical And Surgical History Notes Oncologic Prostate cancer- currently treated with horomone therapy Review of Systems (ROS) Constitutional Symptoms (General Health) Denies complaints or symptoms of Fatigue, Fever, Chills, Marked Weight Change. Respiratory Denies complaints or symptoms of Chronic or frequent coughs, Shortness of Breath. Cardiovascular Denies complaints or symptoms of Chest pain, LE edema. Psychiatric Denies complaints or symptoms of Anxiety, Claustrophobia. Objective Constitutional Well-nourished and well-hydrated in no acute distress. Vitals Time Taken: 10:26 AM, Height: 73 in, Weight: 210 lbs, BMI: 27.7, Temperature: 98.0 F, Pulse: 72 bpm, Respiratory Rate: 16 breaths/min, Blood Pressure: 130/64 mmHg. Respiratory normal breathing without difficulty. Psychiatric this patient is able to make decisions and demonstrates good insight into disease process. Alert and Oriented x 3. pleasant and cooperative. General Notes: Patient's wound bed currently did require some sharp debridement to remove some of the hyper granular nodules and I was able to perform debridement today without complication post debridement he did have quite a bit of bleeding I used silver nitrate and the total amount of 6 sticks in order to achieve hemostasis he was pretty much at that point at the end of treatment today before the dressing was applied and discharge. Overall I did not see any issues of  anything worsening which is also good news. Integumentary (Hair, Skin) Wound #1 status is Open. Original cause of wound was Pressure Injury. The wound is located on the Right Gluteal fold. The wound measures 2.4cm length x 2.5cm width x 2.3cm depth; 4.712cm^2 area and 10.838cm^3 volume. There is muscle and Fat Layer (Subcutaneous Tissue) Exposed exposed. There is tunneling at 12:00 with a maximum distance of 4.1cm. There is a large amount of serous drainage noted. The wound margin is epibole. There is large (67-100%) pink, hyper - granulation within the wound bed. There is a small (1-33%) amount of necrotic tissue within the wound bed including Adherent Slough. 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 There was a Excisional Skin/Subcutaneous Tissue Debridement with a total area of 6 sq cm performed by [name], HOYT E., [name]. With the following instrument(s): Curette to remove Viable and Non-Viable tissue/material. Material removed includes Subcutaneous Tissue, Slough, and Hyper-granulation after achieving pain control using Lidocaine 4% Topical Solution. No specimens were taken. A time out was conducted at 11:07, prior to the start of the procedure. A Moderate amount of bleeding was controlled with Silver Nitrate. The procedure was tolerated well with a pain level of 0 throughout and a pain level of 0 following the procedure. Post Debridement Measurements: 2.4cm length x 2.5cm width x 2.4cm depth; 11.31cm^3 volume. Post debridement Stage noted as Category/Stage IV. Character of Wound/Ulcer Post Debridement is improved. Post  procedure Diagnosis Wound #1: Same as Pre-Procedure Plan Wound Cleansing: Wound #1 Right  Gluteal fold: Clean wound with Normal Saline. - in office Cleanse wound with mild soap and water Skin Barriers/Peri-Wound Care: Skin Prep Primary Wound Dressing: Wound #1 Right Gluteal fold: Silver Collagen - under NPWT Other: - in clinic - silvercel, xtrasorb, tegaderm Dressing Change Frequency: Wound #1 Right Gluteal fold: Change Dressing Monday, Wednesday, Friday Follow-up Appointments: Wound #1 Right Gluteal fold: Return Appointment in 1 week. Off-Loading: Wound #1 Right Gluteal fold: Turn and reposition every 2 hours Home Health: MAINOR, DEWIT ([id number]) Wound #1 Right Gluteal fold: [place] Nurse may visit PRN to address patient s wound care needs. FACE TO FACE ENCOUNTER: MEDICARE and MEDICAID PATIENTS: I certify that this patient is under my care and that I had a face-to-face encounter that meets the physician face-to-face encounter requirements with this patient on this date. The encounter with the patient was in whole or in part for the following MEDICAL CONDITION: (primary reason for [place]) MEDICAL NECESSITY: I certify, that based on my findings, NURSING services are a medically necessary home health service. HOME BOUND STATUS: I certify that my clinical findings support that this patient is homebound (i.e., Due to illness or injury, pt requires aid of supportive devices such as crutches, cane, wheelchairs, walkers, the use of special transportation or the assistance of another person to leave their place of residence. There is a normal inability to leave the home and doing so requires considerable and taxing effort. Other absences are for medical reasons / religious services and are infrequent or of short duration when for other reasons). If current dressing causes regression in wound condition, may D/C ordered dressing product/s and  apply Normal Saline Moist Dressing daily until next [place] / Other MD appointment. [place] of regression in wound condition at [phone]. Please direct any NON-WOUND related issues/requests for orders to patient's Primary Care Physician Negative Pressure Wound Therapy: Wound #1 Right Gluteal fold: Wound VAC settings at 125/130 mmHg continuous pressure. Use BLACK/GREEN foam to wound cavity. Use WHITE foam to fill any tunnel/s and/or undermining. Change VAC dressing 3 X WEEK. Change canister as indicated when full. Nurse may titrate settings and frequency of dressing changes as clinically indicated. Home Health Nurse may d/c VAC for s/s of increased infection, significant wound regression, or uncontrolled drainage. [place] at [phone]. Apply contact layer over base of wound. - apply prisma/silver collagen to the base of the wound Number of foam/gauze pieces used in the dressing = Place NPWT on HOLD. 1. At this point I would recommend that we continue with the wound VAC as I feel like this is doing a good job for the patient he is in agreement with the plan. 2. With regard to offloading he will continue with appropriate offloading which I think is also good to be beneficial for him ongoing. 3. I am going to suggest that we continue with hyperbaric oxygen therapy as well. We will see patient back for reevaluation in 1 week here in the clinic. If anything worsens or changes patient will contact our office for additional recommendations. Electronic Signature(s) Signed: 05/31/2019 12:52:05 PM By: Worthy Keeler [name] Entered By: Worthy Keeler on 05/31/2019 12:52:05 PHILLIPPE, RAYMO ([id number]) -------------------------------------------------------------------------------- ROS/PFSH Details Patient Name: [name] Date of Service: 05/31/2019 10:15 AM Medical Record Number: [id number] Patient Account Number:  [id number] Date of Birth/Sex: May 25, 1951 (68 y.o. M) Treating RN: Montey Hora Primary Care Provider: [name] Other Clinician: [name] Referring  Provider: [name] Treating Provider/Extender: [name], HOYT Weeks in Treatment: 48 Information Obtained From Patient Constitutional Symptoms (General Health) Complaints and Symptoms: Negative for: Fatigue; Fever; Chills; Marked Weight Change Respiratory Complaints and Symptoms: Negative for: Chronic or frequent coughs; Shortness of Breath Medical History: Negative for: Aspiration; Asthma; Chronic Obstructive Pulmonary Disease (COPD); Pneumothorax; Sleep Apnea; Tuberculosis Cardiovascular Complaints and Symptoms: Negative for: Chest pain; LE edema Medical History: Positive for: Hypertension - takes medication Negative for: Angina; Arrhythmia; Congestive Heart Failure; Coronary Artery Disease; Deep Vein Thrombosis; Hypotension; Myocardial Infarction; Peripheral Arterial Disease; Peripheral Venous Disease; Phlebitis; Vasculitis Psychiatric Complaints and Symptoms: Negative for: Anxiety; Claustrophobia Medical History: Negative for: Anorexia/bulimia; Confinement Anxiety Eyes Medical History: Positive for: Cataracts - both removed Negative for: Glaucoma; Optic Neuritis Ear/Nose/Mouth/Throat Medical History: Negative for: Chronic sinus problems/congestion; Middle ear problems Hematologic/Lymphatic Medical History: Negative for: Anemia; Hemophilia; Human Immunodeficiency Virus; Lymphedema BROOKS, CONNER. ([id number]) Gastrointestinal Medical History: Negative for: Cirrhosis ; Colitis; Crohnos; Hepatitis A; Hepatitis B; Hepatitis C Endocrine Medical History: Negative for: Type I Diabetes; Type II Diabetes Genitourinary Medical History: Negative for: End Stage Renal Disease Immunological Medical History: Negative for: Lupus Erythematosus; Raynaudos; Scleroderma Integumentary (Skin) Medical  History: Negative for: History of Burn; History of pressure wounds Musculoskeletal Medical History: Negative for: Gout; Rheumatoid Arthritis; Osteoarthritis; Osteomyelitis Neurologic Medical History: Positive for: Paraplegia - waist down Negative for: Dementia; Neuropathy; Quadriplegia; Seizure Disorder Oncologic Medical History: Negative for: Received Chemotherapy; Received Radiation Past Medical History Notes: Prostate cancer- currently treated with horomone therapy HBO Extended History Items Eyes: Cataracts Immunizations Pneumococcal Vaccine: Received Pneumococcal Vaccination: No Implantable Devices No devices added Family and Social History Cancer: No; Diabetes: No; Heart Disease: No; Hypertension: Yes - Father; Kidney Disease: No; Lung Disease: No; Seizures: No; Stroke: Yes - Mother; Thyroid Problems: No; Tuberculosis: No; Never smoker; Marital Status - Married; Alcohol Use: Never; Drug Use: No History; Caffeine Use: Daily ROD, VENTURINO ([id number]) Physician Affirmation I have reviewed and agree with the above information. Electronic Signature(s) Signed: 05/31/2019 5:03:01 PM By: Montey Hora Signed: 05/31/2019 5:47:19 PM By: Worthy Keeler [name] Entered By: Worthy Keeler on 05/31/2019 12:50:09 GRAY, AFFLECK ([id number]) -------------------------------------------------------------------------------- SuperBill Details Patient Name: [name] Date of Service: 05/31/2019 Medical Record Number: [id number] Patient Account Number: [id number] Date of Birth/Sex: October 27, 1950 (68 y.o. 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

## 2019-06-01 ENCOUNTER — Encounter: Payer: Medicare Other | Admitting: Internal Medicine

## 2019-06-01 ENCOUNTER — Other Ambulatory Visit: Payer: Self-pay

## 2019-06-01 DIAGNOSIS — L89314 Pressure ulcer of right buttock, stage 4: Secondary | ICD-10-CM | POA: Diagnosis not present

## 2019-06-01 NOTE — Progress Notes (Signed)
[name], [name] ([id number]) Visit Report for 06/01/2019 Arrival Information Details Patient Name: [name], [name] Date of Service: 06/01/2019 8:00 AM Medical Record Number: [id number] Patient Account Number: [id number] Date of Birth/Sex: October 01, 1950 (68 y.o. M) Treating RN: [name] Primary Care Romolo Sieling: [name] Other Clinician: [name] Referring Doctor Sheahan: [name] Treating Katharin Schneider/Extender: [name] in Treatment: 36 Visit Information History Since Last Visit Added or deleted any medications: No Patient Arrived: Wheel Chair Any new allergies or adverse reactions: No Arrival Time: 07:59 Had a fall or experienced change in No Accompanied By: wife activities of daily living that may affect Transfer Assistance: Harrel Lemon Lift risk of falls: Patient Identification Verified: Yes Signs or symptoms of abuse/neglect since last visito No Secondary Verification Process Completed: Yes Hospitalized since last visit: No Patient Requires Transmission-Based No Implantable device outside of the clinic excluding No Precautions: cellular tissue based products placed in the center Patient Has Alerts: Yes since last visit: Patient Alerts: NOT Has Dressing in Place as Prescribed: Yes Diabetic Pain Present Now: No Electronic Signature(s) Signed: 06/01/2019 10:40:50 AM By: Lorine Bears RCP, RRT, CHT Entered By: Lorine Bears on 06/01/2019 08:00:07 [name], [name] ([id number]) -------------------------------------------------------------------------------- Encounter Discharge Information Details Patient Name: [name] Date of Service: 06/01/2019 8:00 AM Medical Record Number: [id number] Patient Account Number: [id number] Date of Birth/Sex: March 04, 1951 (68 y.o. M) Treating RN: [name] Primary Care Shaneese Tait: [name] Other Clinician: [name] Referring Felix Meras: [name] Treating Brenn Deziel/Extender: [name] in Treatment: 66 Encounter Discharge Information Items Discharge Condition: Stable Ambulatory Status: Wheelchair Discharge Destination: Home Transportation: Private Auto Accompanied By: wife Schedule Follow-up Appointment: Yes Clinical Summary of Care: Notes Patient has an HBO treatment scheduled on 06/02/2019 at 08:00 am. Electronic Signature(s) Signed: 06/01/2019 10:40:50 AM By: Lorine Bears RCP, RRT, CHT Entered By: Lorine Bears on 06/01/2019 10:40:34 [name], [name] ([id number]) -------------------------------------------------------------------------------- Vitals Details Patient Name: [name] Date of Service: 06/01/2019 8:00 AM Medical Record Number: [id number] Patient Account Number: [id number] Date of Birth/Sex: 1951-06-28 (68 y.o. M) Treating RN: [name] Primary Care Jonty Morrical: [name] Other Clinician: [name] Referring Exavier Lina: [name] Treating Tamecka Milham/Extender: [name] in Treatment: 98 Vital Signs Time Taken: 08:15 Temperature (F): 98.7 Height (in): 73 Pulse (bpm): 60 Weight (lbs): 210 Respiratory Rate (breaths/min): 16 Body Mass Index (BMI): 27.7 Blood Pressure (mmHg): 118/70 Reference Range: 80 - 120 mg / dl Electronic Signature(s) Signed: 06/01/2019 10:40:50 AM By: Lorine Bears RCP, RRT, CHT Entered By: Lorine Bears on 06/01/2019 08:28:50

## 2019-06-01 NOTE — Progress Notes (Signed)
OAKLEN, GALLIHUGH ([id number]) Visit Report for 05/31/2019 HBO Details Patient Name: [name], [name] Date of Service: 05/31/2019 8:00 AM Medical Record Number: [id number] Patient Account Number: [id number] Date of Birth/Sex: 1950/09/28 (68 y.o. 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 HBO Treatment Details Treatment Number: 28 Patient Type: Outpatient Chamber Type: Monoplace Chamber Serial #: [id number] Treatment Protocol: 2.0 ATA with 90 minutes oxygen, and no air breaks Treatment Details Compression Rate Down: 1.5 psi / minute De-Compression Rate Up: 1.5 psi / minute Compress Tx Pressure Air breaks and breathing periods Decompress Decompress Begins Reached (leave unused spaces blank) Begins Ends Chamber Pressure (ATA) 1 2 - - - - - - 2 1 Clock Time (24 hr) 08:16 08:27 - - - - - - 09:58 10:08 Treatment Length: 112 (minutes) Treatment Segments: 4 Vital Signs Capillary Blood Glucose Reference Range: 80 - 120 mg / dl HBO Diabetic Blood Glucose Intervention Range: <131 mg/dl or >249 mg/dl Time Vitals Blood Respiratory Capillary Blood Glucose Pulse Action Type: Pulse: Temperature: Taken: Pressure: Rate: Glucose (mg/dl): Meter #: Oximetry (%) Taken: Pre 08:12 138/80 60 16 98.7 Post 10:26 130/64 72 16 98 Treatment Response Treatment Completion Status: Treatment Completed without Adverse Event Electronic Signature(s) Signed: 05/31/2019 4:25:36 PM By: Paulla Fore, RRT, CHT Signed: 05/31/2019 5:47:19 PM By: Worthy Keeler [name] Entered By: Lorine Bears on 05/31/2019 10:28:57 NAKOTA, LICHTENSTEIN ([id number]) -------------------------------------------------------------------------------- HBO Safety Checklist Details Patient Name: [name] Date of Service: 05/31/2019 8:00 AM Medical Record Number: [id number] Patient Account Number: [id number] Date of Birth/Sex: 04-30-1951 (68 y.o. M) Treating RN: Montey Hora Primary Care Tanessa Tidd: [name] Other Clinician: [name] Referring Nini Cavan: [name] Treating Sia Gabrielsen/Extender: [name], HOYT Weeks in Treatment: 10 HBO Safety Checklist Items Safety Checklist Consent Form Signed Patient voided / foley secured and emptied When did you last eato morning Last dose of injectable or oral agent n/a Ostomy pouch emptied and vented if applicable NA Medtronic Baclofen Pump; Nevro All implantable devices assessed, documented and approved Spinal Stimulator Intravenous access site secured and place NA Valuables secured Linens and cotton and cotton/polyester blend (less than 51% polyester) Personal oil-based products / skin lotions / body lotions removed Wigs or hairpieces removed NA Smoking or tobacco materials removed NA Books / newspapers / magazines / loose paper removed Cologne, aftershave, perfume and deodorant removed Jewelry removed (may wrap wedding band) Make-up removed NA Hair care products removed Battery operated devices (external) removed NPWT pump Heating patches and chemical warmers removed Titanium eyewear removed NA Nail polish cured greater than 10 hours NA Casting material cured greater than 10 hours NA Hearing aids removed NA Loose dentures or partials removed NA Prosthetics have been removed NA Patient demonstrates correct use of air break device (if applicable) Patient concerns have been addressed Patient grounding bracelet on and cord attached to chamber Specifics for Inpatients (complete in  addition to above) Medication sheet sent with patient Intravenous medications needed or due during therapy sent with patient Drainage tubes (e.g. nasogastric tube or chest tube secured and vented) Endotracheal or Tracheotomy tube secured Cuff deflated of air and inflated with saline Airway suctioned Electronic Signature(s) CHICK, FELGAR ([id number]) Signed: 05/31/2019 4:25:36 PM  By: Lorine Bears RCP, RRT, CHT Entered By: Lorine Bears on 05/31/2019 08:31:27

## 2019-06-02 ENCOUNTER — Encounter: Payer: Medicare Other | Admitting: Physician Assistant

## 2019-06-02 DIAGNOSIS — L89314 Pressure ulcer of right buttock, stage 4: Secondary | ICD-10-CM | POA: Diagnosis not present

## 2019-06-02 NOTE — Progress Notes (Signed)
SHEIK, BOGACKI ([id number]) Visit Report for 06/02/2019 Arrival Information Details Patient Name: [name], [name] Date of Service: 06/02/2019 8:00 AM Medical Record Number: [id number] Patient Account Number: [id number] Date of Birth/Sex: May 09, 1951 (68 y.o. M) Treating RN: Army Melia Primary Care Jaxsin Bottomley: [name] Other Clinician: [name] Referring Ebonye Reade: [name] Treating Shanicqua Coldren/Extender: [name], HOYT Weeks in Treatment: 33 Visit Information History Since Last Visit Added or deleted any medications: No Patient Arrived: Wheel Chair Any new allergies or adverse reactions: No Arrival Time: 07:55 Had a fall or experienced change in No Accompanied By: wife activities of daily living that may affect Transfer Assistance: Harrel Lemon Lift risk of falls: Patient Identification Verified: Yes Signs or symptoms of abuse/neglect since last visito No Secondary Verification Process Completed: Yes Hospitalized since last visit: No Patient Requires Transmission-Based No Implantable device outside of the clinic excluding No Precautions: cellular tissue based products placed in the center Patient Has Alerts: Yes since last visit: Patient Alerts: NOT Has Dressing in Place as Prescribed: Yes Diabetic Pain Present Now: No Electronic Signature(s) Signed: 06/02/2019 10:33:29 AM By: Lorine Bears RCP, RRT, CHT Entered By: Lorine Bears on 06/02/2019 09:52:03 AMANDUS, POELMAN ([id number]) -------------------------------------------------------------------------------- Encounter Discharge Information Details Patient Name: [name] Date of Service: 06/02/2019 8:00 AM Medical Record Number: [id number] Patient Account Number: [id number] Date of Birth/Sex: [birth year] (68 y.o. M) Treating RN: Army Melia Primary Care Kristle Wesch: [name] Other Clinician: [name] Referring Runette Scifres: [name] Treating Oleg Oleson/Extender: [name], HOYT Weeks in Treatment: 42 Encounter Discharge Information Items Discharge Condition: Stable Ambulatory Status: Wheelchair Discharge Destination: Home Transportation: Private Auto Accompanied By: daughter, Joelene Millin Schedule Follow-up Appointment: Yes Clinical Summary of Care: Notes Patient has an HBO treatment scheduled on 06/03/2019 at 08:00 am. Electronic Signature(s) Signed: 06/02/2019 10:33:29 AM By: Lorine Bears RCP, RRT, CHT Entered By: Lorine Bears on 06/02/2019 10:27:58 BENJIMEN, TREDWAY ([id number]) -------------------------------------------------------------------------------- Vitals Details Patient Name: [name] Date of Service: 06/02/2019 8:00 AM Medical Record Number: [id number] Patient Account Number: [id number] Date of Birth/Sex: 02/12/1951 (68 y.o. M) Treating RN: Army Melia Primary Care Ilan Kahrs: [name] Other Clinician: [name] Referring Anelle Parlow: [name] Treating Mayelin Panos/Extender: [name], HOYT Weeks in Treatment: 74 Vital Signs Time Taken: 08:09 Temperature (F): 98.0 Height (in): 73 Pulse (bpm): 66 Weight (lbs): 210 Respiratory Rate (breaths/min): 16 Body Mass Index (BMI): 27.7 Blood Pressure (mmHg): 118/78 Reference Range: 80 - 120 mg / dl Electronic Signature(s) Signed: 06/02/2019 10:33:29 AM By: Lorine Bears RCP, RRT, CHT Entered By: Lorine Bears on 06/02/2019 09:32:46

## 2019-06-02 NOTE — Progress Notes (Signed)
HAGAN, BARCELO ([id number]) Visit Report for 06/01/2019 HBO Details Patient Name: [name], [name] Date of Service: 06/01/2019 8:00 AM Medical Record Number: [id number] Patient Account Number: [id number] Date of Birth/Sex: 1950-08-10 (68 y.o. 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 HBO Treatment Details Treatment Number: 29 Patient Type: Outpatient Chamber Type: Monoplace Chamber Serial #: [id number] Treatment Protocol: 2.0 ATA with 90 minutes oxygen, and no air breaks Treatment Details Compression Rate Down: 1.5 psi / minute De-Compression Rate Up: 2.0 psi / minute Compress Tx Pressure Air breaks and breathing periods Decompress Decompress Begins Reached (leave unused spaces blank) Begins Ends Chamber Pressure (ATA) 1 2 - - - - - - 2 1 Clock Time (24 hr) 08:18 08:30 - - - - - - 10:00 10:08 Treatment Length: 110 (minutes) Treatment Segments: 4 Vital Signs Capillary Blood Glucose Reference Range: 80 - 120 mg / dl HBO Diabetic Blood Glucose Intervention Range: <131 mg/dl or >249 mg/dl Time Vitals Blood Respiratory Capillary Blood Glucose Pulse Action Type: Pulse: Temperature: Taken: Pressure: Rate: Glucose (mg/dl): Meter #: Oximetry (%) Taken: Pre 08:15 118/70 60 16 98.7 Post 10:13 128/70 66 16 98.7 Treatment Response Treatment Completion Status: Treatment Completed without Adverse Event Rossy Virag Notes No concerns with treatment given HBO Attestation I certify that I supervised this HBO treatment in accordance with Medicare guidelines. A trained emergency response  Yes team is readily available per hospital policies and procedures. Continue HBOT as ordered. Yes Electronic Signature(s) MUBARAK, PELON ([id number]) Signed: 06/01/2019 5:41:49 PM By: Linton Ham MD Previous Signature: 06/01/2019 10:40:50 AM Version By: Lorine Bears RCP, RRT, CHT Entered By: Linton Ham on 06/01/2019 17:40:36 Pebley, [name] -------------------------------------------------------------------------------- HBO Safety Checklist Details Patient Name: [name] Date of Service: 06/01/2019 8:00 AM Medical Record Number: [id number] Patient Account Number: [id number] Date of Birth/Sex: June 21, 1951 (68 y.o. M) Treating RN: [name] Primary Care Shyrl Obi: [name] Other Clinician: [name] Referring Tamara Monteith: [name] Treating Eyden Dobie/Extender: [name] in Treatment: 9 HBO Safety Checklist Items Safety Checklist Consent Form Signed Patient voided / foley secured and emptied When did you last eato morning Last dose of injectable or oral agent n/a Ostomy pouch emptied and vented if applicable NA Medtronic Baclofen Pump; Nevro All implantable devices assessed, documented and approved Spinal Stimulator Intravenous access site secured and place NA Valuables secured Linens and cotton and cotton/polyester blend (less than 51% polyester) Personal oil-based products / skin lotions / body lotions removed Wigs or hairpieces removed NA Smoking or tobacco materials removed NA Books / newspapers / magazines / loose paper removed Cologne, aftershave, perfume and deodorant removed Jewelry removed (may wrap wedding band) Make-up removed NA Hair care products removed Battery operated devices (external) removed NPWT pump Heating patches and chemical warmers removed Titanium eyewear removed NA Nail polish cured greater than 10 hours NA Casting material cured greater than 10  hours NA Hearing aids removed NA Loose dentures or partials removed NA Prosthetics have been removed NA Patient demonstrates correct use of air break device (if applicable) Patient concerns have been addressed Patient grounding bracelet on and cord attached to chamber Specifics for Inpatients (complete in addition to above) Medication sheet sent with patient Intravenous  medications needed or due during therapy sent with patient Drainage tubes (e.g. nasogastric tube or chest tube secured and vented) Endotracheal or Tracheotomy tube secured Cuff deflated of air and inflated with saline Airway suctioned Electronic Signature(s) CUYLER, WINBERRY ([id number]) Signed: 06/01/2019 10:40:50 AM By: Lorine Bears RCP, RRT, CHT Entered By: Lorine Bears on 06/01/2019 09:17:15

## 2019-06-02 NOTE — Progress Notes (Signed)
Brett, [name] ([id number]) Visit Report for 06/02/2019 HBO Details Patient Name: [name], [name] Date of Service: 06/02/2019 8:00 AM Medical Record Number: [id number] Patient Account Number: [id number] Date of Birth/Sex: 1950/11/05 (68 y.o. 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 HBO Treatment Details Treatment Number: 30 Patient Type: Outpatient Chamber Type: Monoplace Chamber Serial #: [id number] Treatment Protocol: 2.0 ATA with 90 minutes oxygen, and no air breaks Treatment Details Compression Rate Down: 1.5 psi / minute De-Compression Rate Up: 1.5 psi / minute Compress Tx Pressure Air breaks and breathing periods Decompress Decompress Begins Reached (leave unused spaces blank) Begins Ends Chamber Pressure (ATA) 1 2 - - - - - - 2 1 Clock Time (24 hr) 08:13 08:25 - - - - - - 09:56 10:06 Treatment Length: 113 (minutes) Treatment Segments: 4 Vital Signs Capillary Blood Glucose Reference Range: 80 - 120 mg / dl HBO Diabetic Blood Glucose Intervention Range: <131 mg/dl or >249 mg/dl Time Vitals Blood Respiratory Capillary Blood Glucose Pulse Action Type: Pulse: Temperature: Taken: Pressure: Rate: Glucose (mg/dl): Meter #: Oximetry (%) Taken: Pre 08:09 118/78 66 16 98 Post 10:10 132/78 66 16 98 Treatment Response Treatment Toleration: Well Treatment Completion Treatment Completed without Adverse Event Status: HBO Attestation I certify that I supervised this HBO treatment in accordance with Medicare guidelines. A trained emergency response Yes team is readily  available per hospital policies and procedures. Continue HBOT as ordered. Yes Electronic Signature(s) Brett, [name] ([id number]) Signed: 06/02/2019 3:12:35 PM By: Worthy Keeler [name] Previous Signature: 06/02/2019 10:33:29 AM Version By: Lorine Bears RCP, RRT, CHT Entered By: Worthy Keeler on 06/02/2019 15:12:35 RAN, [name] ([id number]) -------------------------------------------------------------------------------- HBO Safety Checklist Details Patient Name: [name] Date of Service: 06/02/2019 8:00 AM Medical Record Number: [id number] Patient Account Number: [id number] Date of Birth/Sex: 01/22/1951 (68 y.o. M) Treating RN: Army Melia Primary Care Kalayla Shadden: [name] Other Clinician: [name] Referring Savino Whisenant: [name] Treating Maitland Lesiak/Extender: [name], [name] Weeks in Treatment: 64 HBO Safety Checklist Items Safety Checklist Consent Form Signed Patient voided / foley secured and emptied When did you last eato morning Last dose of injectable or oral agent n/a Ostomy pouch emptied and vented if applicable NA Medtronic Baclofen Pump; Nevro All implantable devices assessed, documented and approved Spinal Stimulator Intravenous access site secured and place NA Valuables secured Linens and cotton and cotton/polyester blend (less than 51% polyester) Personal oil-based products / skin lotions / body lotions removed Wigs or hairpieces removed NA Smoking or tobacco materials removed NA Books / newspapers / magazines / loose paper removed Cologne, aftershave, perfume and deodorant removed Jewelry removed (may wrap wedding band) Make-up removed NA Hair care products removed Battery operated devices (external) removed NPWT pump Heating patches and chemical warmers removed Titanium eyewear removed NA Nail polish cured greater than 10 hours NA Casting material cured greater than 10 hours NA Hearing aids  removed NA Loose dentures or partials removed NA Prosthetics have been removed NA Patient demonstrates correct use of air break device (if applicable) Patient concerns have been addressed Patient grounding bracelet on and cord attached to chamber Specifics for Inpatients (complete in addition to above) Medication sheet sent with patient Intravenous medications needed  or due during therapy sent with patient Drainage tubes (e.g. nasogastric tube or chest tube secured and vented) Endotracheal or Tracheotomy tube secured Cuff deflated of air and inflated with saline Airway suctioned Electronic Signature(s) Brett, [name] ([id number]) Signed: 06/02/2019 10:33:29 AM By: Lorine Bears RCP, RRT, CHT Entered By: Lorine Bears on 06/02/2019 09:33:43

## 2019-06-02 NOTE — Progress Notes (Signed)
BHAVYA, SUVER ([id number]) Visit Report for 06/02/2019 Problem List Details Patient Name: [name], [name] Date of Service: 06/02/2019 8:00 AM Medical Record Number: [id number] Patient Account Number: [id number] Date of Birth/Sex: Dec 23, 1950 (68 y.o. 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, sequela I10  Essential (primary) hypertension Facility Procedures CPT4 Code: [id number] Description: (Facility Use Only) HBOT, full body chamber, 3min Modifier: Quantity: 4 Physician Procedures CPT4 Code: [id number] Description: [id number] - WC PHYS HYPERBARIC OXYGEN THERAPY ICD-10 Diagnosis Description M86.68 Other chronic osteomyelitis, other site L89.314 Pressure ulcer of right buttock, stage 4 L03.317 Cellulitis of buttock 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

## 2019-06-02 NOTE — Progress Notes (Signed)
NERIAH, [name] ([id number]) Visit Report for 05/31/2019 Arrival Information Details Patient Name: [name], [name] Date of Service: 05/31/2019 10:15 AM Medical Record Number: [id number] Patient Account Number: [id number] Date of Birth/Sex: 04-16-1951 (68 y.o. M) Treating RN: Montey Hora Primary Care Ishmeal [name]: [name] Other Clinician: [name] Referring Britainy Kozub: [name] Treating Everline Mahaffy/Extender: [name], HOYT Weeks in Treatment: 4 Visit Information History Since Last Visit Added or deleted any medications: No Patient Arrived: Wheel Chair Any new allergies or adverse reactions: No Arrival Time: 10:29 Had a fall or experienced change in No Accompanied By: wife activities of daily living that may affect Transfer Assistance: Harrel Lemon Lift risk of falls: Patient Identification Verified: Yes Signs or symptoms of abuse/neglect since last visito No Secondary Verification Process Completed: Yes Hospitalized since last visit: No Patient Requires Transmission-Based No Implantable device outside of the clinic excluding No Precautions: cellular tissue based products placed in the center Patient Has Alerts: Yes since last visit: Patient Alerts: NOT Has Dressing in Place as Prescribed: Yes Diabetic Pain Present Now: No Electronic Signature(s) Signed: 05/31/2019 4:25:36 PM By: Lorine Bears RCP, RRT, CHT Entered By: Lorine Bears on 05/31/2019 10:31:30 [name] ([id number]) -------------------------------------------------------------------------------- Encounter Discharge Information Details Patient Name: [name] Date of Service: 05/31/2019 10:15 AM Medical Record Number: [id number] Patient Account Number: [id number] Date of Birth/Sex: 1951/03/09 (68 y.o. M) Treating RN: Montey Hora Primary Care Quin Mathenia: [name] Other Clinician: [name] Referring Kaliopi Blyden: [name] Treating Allen Basista/Extender: [name], HOYT Weeks in Treatment: 58 Encounter Discharge Information Items Post Procedure Vitals Discharge Condition: Stable Temperature (F): 98.0 Ambulatory Status: Wheelchair Pulse (bpm): 72 Discharge Destination: Home Respiratory Rate (breaths/min): 16 Transportation: Private Auto Blood Pressure (mmHg): 130/64 Accompanied By: spouse Schedule Follow-up Appointment: Yes Clinical Summary of Care: Electronic Signature(s) Signed: 05/31/2019 11:48:25 AM By: Montey Hora Entered By: Montey Hora on 05/31/2019 11:48:24 [name] ([id number]) -------------------------------------------------------------------------------- Lower Extremity Assessment Details Patient Name: [name] Date of Service: 05/31/2019 10:15 AM Medical Record Number: [id number] Patient Account Number: [id number] Date of Birth/Sex: 1950/10/25 (68 y.o. M) Treating RN: Montey Hora Primary Care Teanna Elem: [name] Other Clinician: [name] Referring Nirvan Laban: [name] Treating Kiela Shisler/Extender: [name], HOYT Weeks in Treatment: 81 Electronic Signature(s) Signed: 05/31/2019 5:03:01 PM By: Montey Hora Entered By: Montey Hora on 05/31/2019 11:31:04 [name], [name] ([id number]) -------------------------------------------------------------------------------- Multi Wound Chart Details Patient Name: [name] Date of Service: 05/31/2019 10:15 AM Medical Record Number: [id number] Patient Account Number: [id number] Date of Birth/Sex: [birth year] (68 y.o. M) Treating RN: Montey Hora Primary Care Pepper Wyndham: [name] Other Clinician: [name] Referring Edona Schreffler: [name] Treating Rodnisha Blomgren/Extender: [name], HOYT Weeks in Treatment: 59 Vital Signs Height(in): 73 Pulse(bpm): 72 Weight(lbs): 210 Blood Pressure(mmHg): 130/64 Body Mass Index(BMI): 28 Temperature(F):  98.0 Respiratory Rate 16 (breaths/min): Photos: [1:No Photos] [N/A:N/A] Wound Location: [1:Right Gluteal fold] [N/A:N/A] Wounding Event: [1:Pressure Injury] [N/A:N/A] Primary Etiology: [1:Pressure Ulcer] [N/A:N/A] Comorbid History: [1:Cataracts, Hypertension, Paraplegia] [N/A:N/A] Date Acquired: [1:11/02/2017] [N/A:N/A] Weeks of Treatment: [1:78] [N/A:N/A] Wound Status: [1:Open] [N/A:N/A] Measurements L x W x D [1:2.4x2.5x2.3] [N/A:N/A] (cm) Area (cm) : [1:4.712] [N/A:N/A] Volume (cm) : [1:10.838] [N/A:N/A] % Reduction in Area: [1:53.10%] [N/A:N/A] % Reduction in Volume: [1:-978.40%] [N/A:N/A] Position 1 (o'clock): [1:12] Maximum Distance 1 (cm): [1:4.1] Tunneling: [1:Yes] [N/A:N/A] Classification: [1:Category/Stage IV] [N/A:N/A] Exudate Amount: [1:Large] [N/A:N/A] Exudate Type: [1:Serous] [N/A:N/A] Exudate Color: [1:amber] [N/A:N/A] Wound Margin: [1:Epibole] [N/A:N/A] Granulation Amount: [1:Large (67-100%)] [N/A:N/A] Granulation Quality: [1:Pink, Hyper-granulation] [N/A:N/A] Necrotic Amount: [1:Small (1-33%)] [N/A:N/A]  Exposed Structures: [1:Fat Layer (Subcutaneous Tissue) Exposed: Yes Muscle: Yes Fascia: No Tendon: No Joint: No Bone: No Small (1-33%)] [N/A:N/A N/A] Treatment Notes [name], [name] ([id number]) Electronic Signature(s) Signed: 05/31/2019 11:24:26 AM By: Montey Hora Entered By: Montey Hora on 05/31/2019 11:24:26 [name], [name] ([id number]) -------------------------------------------------------------------------------- [place] Details Patient Name: [name] Date of Service: 05/31/2019 10:15 AM Medical Record Number: [id number] Patient Account Number: [id number] Date of Birth/Sex: September 02, 1950 (68 y.o. M) Treating RN: Montey Hora Primary Care Elijahjames Fuelling: [name] Other Clinician: [name] Referring Jenice Leiner: [name] Treating Jordane Hisle/Extender: [name], HOYT Weeks in Treatment:  40 Active Inactive Orientation to the Wound Care Program Nursing Diagnoses: Knowledge deficit related to the wound healing center program Goals: Patient/caregiver will verbalize understanding of the [place] Program Date Initiated: 11/30/2017 Target Resolution Date: 12/21/2017 Goal Status: Active Interventions: Provide education on orientation to the wound center Notes: Pressure Nursing Diagnoses: Knowledge deficit related to management of pressures ulcers Goals: Patient/caregiver will verbalize understanding of pressure ulcer management Date Initiated: 05/17/2018 Target Resolution Date: 05/28/2018 Goal Status: Active Interventions: Assess: immobility, friction, shearing, incontinence upon admission and as needed Notes: Wound/Skin Impairment Nursing Diagnoses: Impaired tissue integrity Goals: Patient/caregiver will verbalize understanding of skin care regimen Date Initiated: 11/30/2017 Target Resolution Date: 12/21/2017 Goal Status: Active Ulcer/skin breakdown will have a volume reduction of 30% by week 4 Date Initiated: 11/30/2017 Target Resolution Date: 12/21/2017 Goal Status: Active [name], [name] ([id number]) Interventions: Assess patient/caregiver ability to obtain necessary supplies Assess patient/caregiver ability to perform ulcer/skin care regimen upon admission and as needed Assess ulceration(s) every visit Treatment Activities: Skin care regimen initiated : 11/30/2017 Notes: Electronic Signature(s) Signed: 05/31/2019 11:24:18 AM By: Montey Hora Entered By: Montey Hora on 05/31/2019 11:24:18 [name] ([id number]) -------------------------------------------------------------------------------- Pain Assessment Details Patient Name: [name] Date of Service: 05/31/2019 10:15 AM Medical Record Number: [id number] Patient Account Number: [id number] Date of Birth/Sex: [birth year] (68 y.o. M) Treating RN: Montey Hora Primary  Care Hester Forget: [name] Other Clinician: [name] Referring Carlei Huang: [name] Treating Undra Harriman/Extender: [name], HOYT Weeks in Treatment: 86 Active Problems Location of Pain Severity and Description of Pain Patient Has Paino No Site Locations Pain Management and Medication Current Pain Management: Electronic Signature(s) Signed: 05/31/2019 4:25:36 PM By: Paulla Fore, RRT, CHT Signed: 05/31/2019 5:03:01 PM By: Montey Hora Entered By: Lorine Bears on 05/31/2019 10:31:39 [name], [name] ([id number]) -------------------------------------------------------------------------------- Patient/Caregiver Education Details Patient Name: [name] Date of Service: 05/31/2019 10:15 AM Medical Record Number: [id number] Patient Account Number: [id number] Date of Birth/Gender: [birth year] (68 y.o. M) Treating RN: Montey Hora Primary Care Physician: [name] Other Clinician: [name] Referring Physician: [name] Treating Physician/Extender: Sharalyn Ink in Treatment: 75 Education Assessment Education Provided To: Patient and Caregiver Education Topics Provided Wound/Skin Impairment: Handouts: Other: wound care a ordered Methods: Demonstration, Explain/Verbal Responses: State content correctly Electronic Signature(s) Signed: 05/31/2019 5:03:01 PM By: Montey Hora Entered By: Montey Hora on 05/31/2019 11:47:20 [name], [name] ([id number]) -------------------------------------------------------------------------------- Wound Assessment Details Patient Name: [name] Date of Service: 05/31/2019 10:15 AM Medical Record Number: [id number] Patient Account Number: [id number] Date of Birth/Sex: [birth year] (68 y.o. M) Treating RN: Army Melia Primary Care Dacie Mandel: [name] Other Clinician: [name] Referring Brnadon Eoff: [name] Treating Arsenio Schnorr/Extender: [name], HOYT Weeks in Treatment: 67 Wound Status Wound Number: 1 Primary Etiology: Pressure Ulcer Wound Location: Right Gluteal fold Wound Status: Open Wounding Event: Pressure Injury Comorbid History: Cataracts, Hypertension,  Paraplegia Date Acquired: 11/02/2017 Weeks Of Treatment: 78 Clustered Wound: No Wound Measurements Length: (cm) 2.4 Width: (cm) 2.5 Depth: (cm) 2.3 Area: (cm) 4.712 Volume: (cm) 10.838 % Reduction in Area: 53.1% % Reduction in Volume: -978.4% Epithelialization: Small (1-33%) Tunneling: Yes Position (o'clock): 12 Maximum Distance: (cm) 4.1 Wound Description Classification: Category/Stage IV Wound Margin: Epibole Exudate Amount: Large Exudate Type: Serous Exudate Color: amber Foul Odor After Cleansing: No Slough/Fibrino Yes Wound Bed Granulation Amount: Large (67-100%) Exposed Structure Granulation Quality: Pink, Hyper-granulation Fascia Exposed: No Necrotic Amount: Small (1-33%) Fat Layer (Subcutaneous Tissue) Exposed: Yes Necrotic Quality: Adherent Slough Tendon Exposed: No Muscle Exposed: Yes Necrosis of Muscle: No Joint Exposed: No Bone Exposed: No Treatment Notes Wound #1 (Right Gluteal fold) Notes zinc to peri wound, silvercel, xtrasorb and tegaderm Electronic Signature(s) Signed: 06/02/2019 3:26:34 PM By: Army Melia Entered By: Army Melia on 05/31/2019 10:38:43 [name], [name] ([id number]) [name], [name] ([id number]) -------------------------------------------------------------------------------- Vitals Details Patient Name: [name] Date of Service: 05/31/2019 10:15 AM Medical Record Number: [id number] Patient Account Number: [id number] Date of Birth/Sex: 22-Aug-1950 (68 y.o. M) Treating RN: Montey Hora Primary Care Gaston Dase: [name] Other Clinician: [name] Referring Rush Salce: [name] Treating Serenah Mill/Extender: [name],  HOYT Weeks in Treatment: 65 Vital Signs Time Taken: 10:26 Temperature (F): 98.0 Height (in): 73 Pulse (bpm): 72 Weight (lbs): 210 Respiratory Rate (breaths/min): 16 Body Mass Index (BMI): 27.7 Blood Pressure (mmHg): 130/64 Reference Range: 80 - 120 mg / dl Electronic Signature(s) Signed: 05/31/2019 4:25:36 PM By: Lorine Bears RCP, RRT, CHT Entered By: Lorine Bears on 05/31/2019 10:32:13

## 2019-06-03 ENCOUNTER — Other Ambulatory Visit: Payer: Self-pay

## 2019-06-03 ENCOUNTER — Encounter: Payer: Medicare Other | Admitting: Physician Assistant

## 2019-06-03 DIAGNOSIS — L89314 Pressure ulcer of right buttock, stage 4: Secondary | ICD-10-CM | POA: Diagnosis not present

## 2019-06-03 NOTE — Progress Notes (Signed)
ADON, SENN ([id number]) Visit Report for 06/03/2019 Arrival Information Details Patient Name: [name], [name] Date of Service: 06/03/2019 8:00 AM Medical Record Number: [id number] Patient Account Number: [id number] Date of Birth/Sex: 1950-10-20 (68 y.o. M) Treating RN: Montey Hora Primary Care Gurnoor Ursua: [name] Other Clinician: [name] Referring Patric Vanpelt: [name] Treating Shanyiah Conde/Extender: [name], HOYT Weeks in Treatment: 44 Visit Information History Since Last Visit Added or deleted any medications: No Patient Arrived: Wheel Chair Any new allergies or adverse reactions: No Arrival Time: 07:55 Had a fall or experienced change in No Accompanied By: wife activities of daily living that may affect Transfer Assistance: Harrel Lemon Lift risk of falls: Patient Identification Verified: Yes Signs or symptoms of abuse/neglect since last visito No Secondary Verification Process Completed: Yes Hospitalized since last visit: No Patient Requires Transmission-Based No Implantable device outside of the clinic excluding No Precautions: cellular tissue based products placed in the center Patient Has Alerts: Yes since last visit: Patient Alerts: NOT Has Dressing in Place as Prescribed: Yes Diabetic Pain Present Now: No Electronic Signature(s) Signed: 06/03/2019 10:57:42 AM By: Lorine Bears RCP, RRT, CHT Entered By: Lorine Bears on 06/03/2019 08:29:06 [name], [name] ([id number]) -------------------------------------------------------------------------------- Encounter Discharge Information Details Patient Name: [name] Date of Service: 06/03/2019 8:00 AM Medical Record Number: [id number] Patient Account Number: [id number] Date of Birth/Sex: 1950/08/31 (68 y.o. M) Treating RN: Montey Hora Primary Care Shain Pauwels: [name] Other Clinician: [name] Referring Stancil Deisher: [name] Treating Ethanjames Fontenot/Extender: [name], HOYT Weeks in Treatment: 4 Encounter Discharge Information Items Discharge Condition: Stable Ambulatory Status: Wheelchair Discharge Destination: Home Transportation: Private Auto Accompanied By: wife Schedule Follow-up Appointment: Yes Clinical Summary of Care: Notes Patient has an HBO treatment scheduled on 06/06/2019 at 08:00 am. Electronic Signature(s) Signed: 06/03/2019 10:57:42 AM By: Lorine Bears RCP, RRT, CHT Entered By: Lorine Bears on 06/03/2019 10:57:25 [name], [name] ([id number]) -------------------------------------------------------------------------------- Vitals Details Patient Name: [name] Date of Service: 06/03/2019 8:00 AM Medical Record Number: [id number] Patient Account Number: [id number] Date of Birth/Sex: 1951/01/01 (68 y.o. M) Treating RN: Montey Hora Primary Care Lulabelle Desta: [name] Other Clinician: [name] Referring Kennetha Pearman: [name] Treating Raquan Iannone/Extender: [name], HOYT Weeks in Treatment: 43 Vital Signs Time Taken: 08:11 Temperature (F): 98.7 Height (in): 73 Pulse (bpm): 72 Weight (lbs): 210 Respiratory Rate (breaths/min): 16 Body Mass Index (BMI): 27.7 Blood Pressure (mmHg): 122/70 Reference Range: 80 - 120 mg / dl Electronic Signature(s) Signed: 06/03/2019 10:57:42 AM By: Lorine Bears RCP, RRT, CHT Entered By: Lorine Bears on 06/03/2019 08:29:30

## 2019-06-03 NOTE — Progress Notes (Signed)
Brett, [name] ([id number]) Visit Report for 06/03/2019 HBO Details Patient Name: Brett, [name] Date of Service: 06/03/2019 8:00 AM Medical Record Number: [id number] Patient Account Number: [id number] Date of Birth/Sex: 18-May-1951 (68 y.o. 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 HBO Treatment Details Treatment Number: 31 Patient Type: Outpatient Chamber Type: Monoplace Chamber Serial #: [id number] Treatment Protocol: 2.0 ATA with 90 minutes oxygen, and no air breaks Treatment Details Compression Rate Down: 1.5 psi / minute De-Compression Rate Up: 2.5 psi / minute Compress Tx Pressure Air breaks and breathing periods Decompress Decompress Begins Reached (leave unused spaces blank) Begins Ends Chamber Pressure (ATA) 1 2 - - - - - - 2 1 Clock Time (24 hr) 08:42 08:54 - - - - - - 10:24 10:30 Treatment Length: 108 (minutes) Treatment Segments: 4 Vital Signs Capillary Blood Glucose Reference Range: 80 - 120 mg / dl HBO Diabetic Blood Glucose Intervention Range: <131 mg/dl or >249 mg/dl Time Vitals Blood Respiratory Capillary Blood Glucose Pulse Action Type: Pulse: Temperature: Taken: Pressure: Rate: Glucose (mg/dl): Meter #: Oximetry (%) Taken: Pre 08:11 122/70 72 16 98.7 Post 10:34 112/62 66 16 97.8 Treatment Response Treatment Toleration: Well Treatment Completion Treatment Completed without Adverse Event Status: HBO Attestation I certify that I supervised this HBO treatment in accordance with Medicare guidelines. A trained emergency response Yes team is readily  available per hospital policies and procedures. Continue HBOT as ordered. Yes Electronic Signature(s) [name], [name] ([id number]) Signed: 06/03/2019 3:29:17 PM By: Worthy Keeler [name] Previous Signature: 06/03/2019 10:57:42 AM Version By: Lorine Bears RCP, RRT, CHT Entered By: Worthy Keeler on 06/03/2019 15:29:17 [name], [name] ([id number]) -------------------------------------------------------------------------------- HBO Safety Checklist Details Patient Name: [name] Date of Service: 06/03/2019 8:00 AM Medical Record Number: [id number] Patient Account Number: [id number] Date of Birth/Sex: 1950-12-29 (68 y.o. M) Treating RN: Montey Hora Primary Care Theodoro Koval: [name] Other Clinician: [name] Referring Cyanna Neace: [name] Treating Cornesha Radziewicz/Extender: [name], HOYT Weeks in Treatment: 75 HBO Safety Checklist Items Safety Checklist Consent Form Signed Patient voided / foley secured and emptied When did you last eato [name] Last dose of injectable or oral agent n/a Ostomy pouch emptied and vented if applicable NA Medtronic Baclofen Pump; Nevro All implantable devices assessed, documented and approved Spinal Stimulator Intravenous access site secured and place NA Valuables secured Linens and cotton and cotton/polyester blend (less than 51% polyester) Personal oil-based products / skin lotions / body lotions removed Wigs or hairpieces removed NA Smoking or tobacco materials removed NA Books / newspapers / magazines / loose paper removed Cologne, aftershave, perfume and deodorant removed Jewelry removed (may wrap wedding band) Make-up removed NA Hair care products removed Battery operated devices (external) removed NPWT pump Heating patches and chemical warmers removed Titanium eyewear removed NA Nail polish cured greater than 10 hours NA Casting material cured greater than 10 hours NA Hearing aids  removed NA Loose dentures or partials removed NA Prosthetics have been removed NA Patient demonstrates correct use of air break device (if applicable) Patient concerns have been addressed Patient grounding bracelet on and cord attached to chamber Specifics for Inpatients (complete in addition to above) Medication sheet sent with patient Intravenous medications needed  or due during therapy sent with patient Drainage tubes (e.g. nasogastric tube or chest tube secured and vented) Endotracheal or Tracheotomy tube secured Cuff deflated of air and inflated with saline Airway suctioned Electronic Signature(s) Brett, [name] ([id number]) Signed: 06/03/2019 10:57:42 AM By: Lorine Bears RCP, RRT, CHT Entered By: Lorine Bears on 06/03/2019 08:30:23

## 2019-06-03 NOTE — Progress Notes (Signed)
[name], [name] ([id number]) Visit Report for 06/03/2019 Problem List Details Patient Name: [name], [name] Date of Service: 06/03/2019 8:00 AM Medical Record Number: [id number] Patient Account Number: [id number] Date of Birth/Sex: 1950/11/12 (68 y.o. 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, sequela I10  Essential (primary) hypertension Facility Procedures CPT4 Code: [id number] Description: (Facility Use Only) HBOT, full body chamber, 86min Modifier: Quantity: 4 Physician Procedures CPT4 Code: [id number] Description: [id number] - WC PHYS HYPERBARIC OXYGEN THERAPY ICD-10 Diagnosis Description M86.68 Other chronic osteomyelitis, other site L89.314 Pressure ulcer of right buttock, stage 4 L03.317 Cellulitis of buttock G82.20 Paraplegia, unspecified Modifier: Quantity: 1 Electronic Signature(s) Signed: 06/03/2019 3:29:24 PM By: Worthy Keeler [name] Previous Signature: 06/03/2019 10:57:42 AM Version By: Lorine Bears RCP, RRT, CHT Entered By: Worthy Keeler on 06/03/2019 15:29:24

## 2019-06-06 ENCOUNTER — Other Ambulatory Visit: Payer: Self-pay

## 2019-06-06 ENCOUNTER — Encounter: Payer: Medicare Other | Admitting: Physician Assistant

## 2019-06-06 DIAGNOSIS — L89314 Pressure ulcer of right buttock, stage 4: Secondary | ICD-10-CM | POA: Diagnosis not present

## 2019-06-06 NOTE — Progress Notes (Signed)
[name] ([id number]) Visit Report for 06/06/2019 HBO Details Patient Name: [name], [name] Date of Service: 06/06/2019 8:00 AM Medical Record Number: [id number] Patient Account Number: [id number] Date of Birth/Sex: April 17, 1951 (68 y.o. 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 HBO Treatment Details Treatment Number: 32 Patient Type: Outpatient Chamber Type: Monoplace Chamber Serial #: [id number] Treatment Protocol: 2.0 ATA with 90 minutes oxygen, and no air breaks Treatment Details Compression Rate Down: 1.5 psi / minute De-Compression Rate Up: 2.0 psi / minute Compress Tx Pressure Air breaks and breathing periods Decompress Decompress Begins Reached (leave unused spaces blank) Begins Ends Chamber Pressure (ATA) 1 2 - - - - - - 2 1 Clock Time (24 hr) 08:25 08:37 - - - - - - 10:07 10:15 Treatment Length: 110 (minutes) Treatment Segments: 4 Vital Signs Capillary Blood Glucose Reference Range: 80 - 120 mg / dl HBO Diabetic Blood Glucose Intervention Range: <131 mg/dl or >249 mg/dl Time Vitals Blood Respiratory Capillary Blood Glucose Pulse Action Type: Pulse: Temperature: Taken: Pressure: Rate: Glucose (mg/dl): Meter #: Oximetry (%) Taken: Pre 08:07 120/62 84 16 98.7 Post 10:20 120/64 60 16 97.9 Treatment Response Treatment Toleration: Well Treatment Completion Treatment Completed without Adverse Event Status: HBO Attestation I certify that I supervised this HBO treatment in accordance with Medicare guidelines. A trained emergency response Yes team is readily  available per hospital policies and procedures. Continue HBOT as ordered. Yes Electronic Signature(s) TAWFIQ, BOQUIST ([id number]) Signed: 06/06/2019 4:39:43 PM By: Worthy Keeler [name] Previous Signature: 06/06/2019 1:55:36 PM Version By: Lorine Bears RCP, RRT, CHT Entered By: Worthy Keeler on 06/06/2019 16:39:43 FURNELL, HUGHART ([id number]) -------------------------------------------------------------------------------- HBO Safety Checklist Details Patient Name: [name] Date of Service: 06/06/2019 8:00 AM Medical Record Number: [id number] Patient Account Number: [id number] Date of Birth/Sex: 1951-03-05 (68 y.o. M) Treating RN: Harold Barban Primary Care Ruey Storer: [name] Other Clinician: [name] Referring Akshaj Besancon: [name] Treating Joyce Heitman/Extender: [name], HOYT Weeks in Treatment: 77 HBO Safety Checklist Items Safety Checklist Consent Form Signed Patient voided / foley secured and emptied When did you last eato morning Last dose of injectable or oral agent n/a Ostomy pouch emptied and vented if applicable NA Medtronic Baclofen Pump; Nevro All implantable devices assessed, documented and approved Spinal Stimulator Intravenous access site secured and place NA Valuables secured Linens and cotton and cotton/polyester blend (less than 51% polyester) Personal oil-based products / skin lotions / body lotions removed Wigs or hairpieces removed NA Smoking or tobacco materials removed NA Books / newspapers / magazines / loose paper removed Cologne, aftershave, perfume and deodorant removed Jewelry removed (may wrap wedding band) Make-up removed NA Hair care products removed Battery operated devices (external) removed NPWT pump Heating patches and chemical warmers removed Titanium eyewear removed NA Nail polish cured greater than 10 hours NA Casting material cured greater than 10 hours NA Hearing aids  removed NA Loose dentures or partials removed NA Prosthetics have been removed NA Patient demonstrates correct use of air break device (if applicable) Patient concerns have been addressed Patient grounding bracelet on and cord attached to chamber Specifics for Inpatients (complete in addition to above) Medication sheet sent with patient Intravenous medications needed  or due during therapy sent with patient Drainage tubes (e.g. nasogastric tube or chest tube secured and vented) Endotracheal or Tracheotomy tube secured Cuff deflated of air and inflated with saline Airway suctioned Electronic Signature(s) SEDGWICK, TRUNDLE ([id number]) Signed: 06/06/2019 1:55:36 PM By: Lorine Bears RCP, RRT, CHT Entered By: Lorine Bears on 06/06/2019 11:13:01

## 2019-06-06 NOTE — Progress Notes (Signed)
MACKINNON, GENOVA ([id number]) Visit Report for 06/06/2019 Problem List Details Patient Name: OLLICE, KITTELL Date of Service: 06/06/2019 8:00 AM Medical Record Number: [id number] Patient Account Number: [id number] Date of Birth/Sex: March 07, 1951 (68 y.o. 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, sequela I10  Essential (primary) hypertension Facility Procedures CPT4 Code: [id number] Description: (Facility Use Only) HBOT, full body chamber, 90min Modifier: Quantity: 4 Physician Procedures CPT4 Code: [id number] Description: [id number] - WC PHYS HYPERBARIC OXYGEN THERAPY ICD-10 Diagnosis Description M86.68 Other chronic osteomyelitis, other site L89.314 Pressure ulcer of right buttock, stage 4 L03.317 Cellulitis of buttock G82.20 Paraplegia, unspecified Modifier: Quantity: 1 Electronic Signature(s) Signed: 06/06/2019 4:39:56 PM By: Worthy Keeler [name] Previous Signature: 06/06/2019 1:55:36 PM Version By: Lorine Bears RCP, RRT, CHT Entered By: Worthy Keeler on 06/06/2019 16:39:55

## 2019-06-06 NOTE — Progress Notes (Signed)
BELVIN, NEELS ([id number]) Visit Report for 06/06/2019 Arrival Information Details Patient Name: [name], [name] Date of Service: 06/06/2019 8:00 AM Medical Record Number: [id number] Patient Account Number: [id number] Date of Birth/Sex: 01/15/1951 (68 y.o. M) Treating RN: Harold Barban Primary Care Joye Wesenberg: [name] Other Clinician: [name] Referring Blaklee Shores: [name] Treating Rodneisha Bonnet/Extender: [name], HOYT Weeks in Treatment: 68 Visit Information History Since Last Visit Added or deleted any medications: No Patient Arrived: Wheel Chair Any new allergies or adverse reactions: No Arrival Time: 08:05 Had a fall or experienced change in No Accompanied By: wife activities of daily living that may affect Transfer Assistance: Harrel Lemon Lift risk of falls: Patient Identification Verified: Yes Signs or symptoms of abuse/neglect since last visito No Secondary Verification Process Completed: Yes Hospitalized since last visit: No Patient Requires Transmission-Based No Implantable device outside of the clinic excluding No Precautions: cellular tissue based products placed in the center Patient Has Alerts: Yes since last visit: Patient Alerts: NOT Has Dressing in Place as Prescribed: Yes Diabetic Pain Present Now: No Electronic Signature(s) Signed: 06/06/2019 1:55:36 PM By: Lorine Bears RCP, RRT, CHT Entered By: Lorine Bears on 06/06/2019 11:00:35 [name] ([id number]) -------------------------------------------------------------------------------- Encounter Discharge Information Details Patient Name: [name] Date of Service: 06/06/2019 8:00 AM Medical Record Number: [id number] Patient Account Number: [id number] Date of Birth/Sex: 02-Apr-1951 (68 y.o. M) Treating RN: Harold Barban Primary Care Addley Ballinger: [name] Other Clinician: [name] Referring Teshawn Moan: [name] Treating Batina Dougan/Extender: [name], HOYT Weeks in Treatment: 10 Encounter Discharge Information Items Discharge Condition: Stable Ambulatory Status: Wheelchair Discharge Destination: Home Transportation: Private Auto Accompanied By: daughter, Joelene Millin Schedule Follow-up Appointment: Yes Clinical Summary of Care: Notes Patient has an HBO treatment scheduled on 06/07/2019 at 08:00 am. Electronic Signature(s) Signed: 06/06/2019 1:55:36 PM By: Lorine Bears RCP, RRT, CHT Entered By: Lorine Bears on 06/06/2019 11:17:05 [name], [name] ([id number]) -------------------------------------------------------------------------------- Vitals Details Patient Name: [name] Date of Service: 06/06/2019 8:00 AM Medical Record Number: [id number] Patient Account Number: [id number] Date of Birth/Sex: 1951-06-14 (68 y.o. M) Treating RN: Harold Barban Primary Care Sheri Prows: [name] Other Clinician: [name] Referring Hava Massingale: [name] Treating Cythia Bachtel/Extender: [name], HOYT Weeks in Treatment: 65 Vital Signs Time Taken: 08:07 Temperature (F): 98.7 Height (in): 73 Pulse (bpm): 84 Weight (lbs): 210 Respiratory Rate (breaths/min): 16 Body Mass Index (BMI): 27.7 Blood Pressure (mmHg): 120/62 Reference Range: 80 - 120 mg / dl Electronic Signature(s) Signed: 06/06/2019 1:55:36 PM By: Lorine Bears RCP, RRT, CHT Entered By: Lorine Bears on 06/06/2019 11:11:49

## 2019-06-07 ENCOUNTER — Encounter: Payer: Medicare Other | Admitting: Physician Assistant

## 2019-06-07 DIAGNOSIS — L89314 Pressure ulcer of right buttock, stage 4: Secondary | ICD-10-CM | POA: Diagnosis not present

## 2019-06-07 NOTE — Progress Notes (Addendum)
BRENNDEN, THAKORE ([id number]) Visit Report for 06/07/2019 Chief Complaint Document Details Patient Name: CAMMERON, BLICKENSTAFF Date of Service: 06/07/2019 10:30 AM Medical Record Number: [id number] Patient Account Number: [id number] Date of Birth/Sex: 29-Feb-1951 (68 y.o. M) Treating RN: Montey Hora Primary Care Provider: [name] Other Clinician: [name] Referring Provider: [name] Treating Provider/Extender: [name], HOYT Weeks in Treatment: 81 Information Obtained from: Patient Chief Complaint Right gluteal fold ulcer Electronic Signature(s) Signed: 06/07/2019 10:56:38 AM By: Worthy Keeler [name] Entered By: Worthy Keeler on 06/07/2019 10:56:38 VIVAAN, BUSBIN ([id number]) -------------------------------------------------------------------------------- HPI Details Patient Name: [name] Date of Service: 06/07/2019 10:30 AM Medical Record Number: [id number] Patient Account Number: [id number] Date of Birth/Sex: Dec 16, 1950 (68 y.o. 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 He states that there is no history of diabetes that he is aware of. He does have issues with his prostate and is currently receiving treatment for this by way of oral medication. With that being said I do not have a lot of details in that regard. Nonetheless the patient presents today as a result of having been referred to [place] by another provider initially home health was set to come out and take care of  his wound although due to the fact that he apparently drives he's not able to receive home health. His wife is therefore trying to help take care of this wound within although they have been struggling with what exactly to do at this point. She states that she can do some things but she is definitely not a nurse and does have some issues with looking at blood. The good news is the wound does not appear to be too deep and is fairly superficial at this point. There is no slough noted there is some nonviable skin noted around the surface of the wound and the perimeter at this point. The central portion of the wound appears to be very good with a dermal layer noted this does not appear to be again deep enough to extend it to subcutaneous tissue at this point. 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 In the pump he has Dilaudid and baclofen. 12/07/17 on evaluation today patient presents for follow-up concerning his ongoing lower back thigh ulcer on the right. He states that he did not get the supplies ordered and therefore has not really been able to perform the dressing changes as directed exactly. His wife was able to get some Boarder Foam Dressing's from the drugstore and subsequently has been using hydrogel which did help to a degree in the wound does appear to be able smaller. There is actually more drainage this week noted than previous. 12/21/17 on evaluation today patient appears to be doing rather well in regard to his right gluteal ulcer. He has been tolerating the dressing changes without complication. There does not appear to be any evidence of infection at this point in time. Overall the wound does seem to be making some progress as  far as the edges are concerned there's not as much in the way of overlapping of the external wound edges and he has a good epithelium to wound bed border for  the most part. This however is not true right at the 12 o'clock location over the span of a little over a centimeters which actually will require debridement today to clean this away and hopefully allow it to continue to heal more appropriately. 12/28/17 on evaluation today patient appears to be doing rather well in regard to his ulcer in the left gluteal region. He's been tolerating the dressing changes without complication. Apparently he has had some difficulty getting his dressing material. Apparently there's been some confusion with ordering we're gonna check into this. Nonetheless overall he's been showing signs of improvement which is good news. Debridement is not required today. 01/04/18 on evaluation today patient presents for follow-up concerning his right gluteal ulcer. He has been tolerating the dressing changes fairly well. On inspection today it appears he may actually have some maceration them concerned about the fact that he may be developing too much moisture in and around the wound bed which can cause delay in healing. With that being said he unfortunately really has not showed significant signs of improvement since last week's evaluation in fact this may even be just the little bit/slightly larger. Nonetheless he's been having a lot of discomfort I'm not sure this is even related to the wound as he has no pain when I'm to breeding or otherwise cleaning the wound during evaluation today. Nonetheless this is something that we did recommend he talked to his pain specialist concerning. 01/11/18 on evaluation today patient appears to be doing better in regard to his ulceration. He has been tolerating the dressing changes without complication. With that being said overall there's no evidence of infection which is good news. The only thing is he did receive the hatch affair blue classic versus the ready nonetheless I feel like this is perfectly fine and appears to have done well for him over  the past week. 01/25/18 on evaluation today patient's wound actually appears to be a little bit larger than during the last evaluation. The good ATHARVA, KOHEN. ([id number]) news is the majority of the wound edges actually appear to be fairly firmly attached to the wound bed unfortunately again we're not really making progress in regard to the size. Roughly the wound is about the same size as when I first saw him although again the wound margin/edges appear to be much better. 02/01/18 on evaluation today patient actually appears to be doing very well in regard to his wound. Applying the Prisma dry does seem to be better although he does still have issues with slow progression of the wound. There was a slight improvement compared to last week's measurements today. Nonetheless I have been considering other options as far as the possibility of Theraskin or even a snap vac. In general I'm not sure that the Theraskin due to location of the wound would be a very good idea. Nonetheless I do think that a snap vac could be a possibility for the patient and in fact I think this could even be an excellent way to manage the wound possibly seeing some improvement in a very rapid fashion here. Nonetheless this is something that we would need to get approved and I did have a lengthy conversation with the patient about this today. 02/08/18 on evaluation today patient appears to be doing a little better in  regard to his ulcer. He has been tolerating the dressing changes without complication. Fortunately despite the fact that the wound is a little bit smaller it's not significantly so unfortunately. We have discussed the possibility of a snap vac we did check with insurance this is actually covered at this point. Fortunately there does not appear to be any sign of infection. Overall I'm fairly pleased with how things seem to be appearing at this point. 02/15/18 on evaluation today patient appears to be doing rather  well in regard to his right gluteal ulcer. Unfortunately the snap vac did not stay in place with his sheer and friction this came loose and did not seem to maintain seal very well. He worked for about two days and it did seem to do very well during that time according to his wife but in general this does not seem to be something that's gonna be beneficial for him long-term. I do believe we need to go back to standard dressings to see if we can find something that will be of benefit. 03/02/18- He is here in follow up evaluation; there is minimal change in the wound. He will continue with the same treatment plan, would consider changing to iodosrob/iodoflex if ulcer continues to to plateau. He will follow up next week 03/08/18 on evaluation today patient's wound actually appears to be about the same size as when I previously saw him several weeks back. Unfortunately he does have some slightly dark discoloration in the central portion of the wound which has me concerned about pressure injury. I do believe he may be sitting for too long a period of time in fact he tells me that "I probably sit for much too long". He does have some Slough noted on the surface of the wound and again as far as the size of the wound is concerned I'm really not seeing anything that seems to have improved significantly. 03/15/18 on evaluation today patient appears to be doing fairly well in regard to his ulcer. The wound measured pretty much about the same today compared to last week's evaluation when looking at his graph. With that being said the area of bruising/deep tissue injury that was noted last week I do not see at this point. He did get a new cushion fortunately this does seem to be have been of benefit in my pinion. It does appear that he's been off of this more which is good news as well I think that is definitely showing in the overall wound measurements. With that being said I do believe that he needs to continue  to offload I don't think that the fact this is doing better should be or is going to allow him to not have to offload and explain this to him as well. Overall he seems to be in agreement the plan I think he understands. The overall appearance of the wound bed is improved compared to last week I think the Iodoflex has been beneficial in that regard. 03/29/18 on evaluation today patient actually appears to be doing rather well in regard to his wound from the overall appearance standpoint he does have some granulation although there's some Slough on the surface of the wound noted as well. With that being said he unfortunately has not improved in regard to the overall measurement of the wound in volume or in size. I did have a discussion with him very specifically about offloading today. He actually does work although he mainly is just sitting throughout the  day. He tells me he offloads by "lifting himself up for 30 seconds off of his chair occasionally" purchase from [employer] which does seem to have helped. And he has a new cushion that he with that being said he's also able to stand some for a very short period of time but not significant enough I think to provide appropriate offloading. I think the biggest issue at this point with the wound and the fact is not healing as quickly as we would like is due to the fact that he is really not able to appropriately offload while at work. He states the beginning after his injury he actually had a bed at his job that he could lay on in order to offload and that does seem to have been of help back at that time. Nonetheless he had not done this in quite some time unfortunately. I think that could be helpful for him this is something I would like for him to look into. 04/05/18 on evaluation today patient actually presents for follow-up concerning his right gluteal ulcer. Again he really is not significantly improved even compared to last week. He has been  tolerating the dressing changes without complication. With that being said fortunately there appears to be no evidence of infection at this time. He has been more proactive in trying to offload. VARNEY, HAAF ([id number]) 04/12/18 on evaluation today patient actually appears to be doing a little better in regard to his wound and the right gluteal fold region. He's been tolerating the dressing changes since removing the oasis without complication. However he was having a lot of burning initially with the oasis in place. He's unsure of exactly why this was given so much discomfort but he assumes that it was the oasis itself causing the problem. Nonetheless this had to be removed after about three days in place although even those three days seem to have made a fairly good improvement in regard to the overall appearance of the wound bed. In fact is the first time that he's made any improvement from the standpoint of measurements in about six weeks. He continues to have no discomfort over the area of the wound itself which leads me to wonder why he was having the burning with the oasis when he does not even feel the actual debridement's themselves. I am somewhat perplexed by this. 04/19/18 on evaluation today patient's wound actually appears to be showing signs of epithelialization around the edge of the wound and in general actually appears to be doing better which is good news. He did have the same burning after about three days with applying the Endoform last week in the same fashion that I would generally apply a skin substitute. This seems to indicate that it's not the oasis to cause the problem but potentially the moisture buildup that just causes things to burn or there may be some other reaction with the skin prep or Steri-Strips. Nonetheless I'm not sure that is gonna be able to tolerate any skin substitute for a long period of time. The good news is the wound actually appears to be doing  better today compared to last week and does seem to finally be making some progress. 04/26/18 on evaluation today patient actually appears to be doing rather well in regard to his ulcer in the right gluteal fold. He has been tolerating the dressing changes without complication which is good news. The Endoform does seem to be helping although he was a little bit more  macerated this week. This seems to be an ongoing issue with fluid control at this point. Nonetheless I think we may be able to add something like Drawtex to help control the drainage. 05/03/18 on evaluation today patient appears to actually be doing better in regard to the overall appearance of his wound. He has been tolerating the dressing changes without complication. Fortunately there appears to be no evidence of infection at this time. I really feel like his wound has shown signs as of today of turning around last week I thought so as well and definitely he could be seen in this week's overall appearance and measurements. In general I'm very pleased with the fact that he finally seems to be making a steady but sure progress. The patient likewise is very pleased. 05/17/18 on evaluation today patient appears to be doing more poorly unfortunately in regard to his ulcer. He has been tolerating the dressing changes without complication. With that being said he tells me that in the past couple of days he and his wife have noticed that we did not seem to be doing quite as well is getting dark near the center. Subsequently upon evaluation today the wound actually does appear to be doing worse compared to previous. He has been tolerating the dressing changes otherwise and he states that he is not been sitting up anymore than he was in the past from what he tells me. Still he has continued to work he states "I'm tired of dealing with this and if I have to just go home and lay in the bed all the time that's what I'll do". Nonetheless I am  concerned about the fact that this wound does appear to be deeper than what it was previous. 05/24/18 upon evaluation today patient actually presents after having been in the hospital due to what was presumed to be sepsis secondary to the wound infection. He had an elevated white blood cell count between 14 and 15. With that being said he does seem to be doing somewhat better now. His wound still is giving him some trouble nonetheless and he is obviously concerned about the fact likely talked about that this does seem to go more deeply than previously noted. I did review his wound culture which showed evidence of Staphylococcus aureus him and group B strep. Nonetheless he is on antibiotics, Levaquin, for this. Subsequently I did review his intake summary from the hospital as well. I also did look at the CT of the lumbar spine with contrast that was performed which showed no bone destruction to suggest lumbar disguises/osteomyelitis or sacral osteomyelitis. There was no paraspinal abscess. Nonetheless it appears this may have been more of just a soft tissue infection at this point which is good news. He still is nonetheless concerned about the wound which again I think is completely reasonable considering everything he's been through recently. 05/31/18 on evaluation today on evaluation today patient actually appears to be showing signs of his wound be a little bit deeper than what I would like to see. Fortunately he does not show any signs of significant infection although his temperature was 99 today he states he's been checking this at home and has not been elevated. Nonetheless with the undermining that I'm seeing at this point I am becoming more concerned about the wound I do think that offloading is a key factor here that is preventing the speedy recovery at this point. There does not appear to be any evidence of again over infection  noted. He's been using Santyl currently. 06/07/18 the patient  presents today for follow-up evaluation regarding the left ulcer in the gluteal region. He has been tolerating the Wound VAC fairly well. He is obviously very frustrated with this he states that to mean is really getting in his way. There does not appear to be any evidence of infection at this time he does have a little bit of odor I do not necessarily associate this with infection just something that we sometimes notice with Wound VAC therapy. With that being said I can definitely catch a tone of discontentment overall in the patient's demeanor today. This when he was previously in the hospital JEROMY, BELMONTE. ([id number]) an CT scan was done of the lumbar region which did not reveal any signs of osteomyelitis. With that being said the pelvis in particular was not evaluated distinctly which means he could still have some osteonecrosis I. Nonetheless the Wound VAC was started on Thursday I do want to get this little bit more time before jumping to a CT scan of the pelvis although that is something that I might would recommend if were not see an improvement by that time. 06/14/18 on evaluation today patient actually appears to be doing about the same in regard to his right gluteal ulcer. Again he did have a CT scan of the lumbar spine unfortunately this did not include the pelvis. Nonetheless with the depth of the wound that I'm seeing today even despite the fact that I'm not seeing any evidence of overt cellulitis I believe there's a good chance that we may be dealing with osteomyelitis somewhere in the right Ischial region. No fevers, chills, nausea, or vomiting noted at this time. 06/21/18 on evaluation today patient actually appears to be doing about the same with regard to his wound. The tunnel at 6 o'clock really does not appear to be any deeper although it is a little bit wider. I think at this point you may want to start packing this with white phone. Unfortunately I have not got approval  for the CT scan of the pelvis as of yet due to the fact that Medicare apparently has been denied it due to the diagnosis codes not being appropriate according to Medicare for the test requested. With that being said the patient cannot have an MRI and therefore this is the only option that we have as far as testing is concerned. The patient has had infection and was on antibiotics and been added code for cellulitis of the bottom to see if this will be appropriate for getting the test approved. Nonetheless I'm concerned about the infection have been spread deeper into the Ischial region. 06/28/18 on evaluation today patient actually appears to be doing rather well all things considered in regard to the right gluteal ulcer. He has been tolerating the dressing changes without complication. With that being said the Wound VAC he states does have to be replaced almost every day or at least reinforced unfortunately. Patient actually has his CT scan later this morning we should have the results by tomorrow. 07/05/18 on evaluation today patient presents for follow-up concerning his right Ischial ulcer. He did see the surgeon Dr. Lysle Pearl last week. They were actually very happy with him and felt like he spent a tremendous amount of time with them as far as discussing his situation was concerned. In the end Dr. Lysle Pearl did contact me as well and determine that he would not recommend any surgical intervention at this point  as he felt like it would not be in the patient's best interest based on what he was seeing. He recommended a referral to infectious disease. Subsequently this is something that Dr. Ines Bloomer office is working on setting up for the patient. As far as evaluation today is concerned the patient's wound actually appears to be worse at this point. I am concerned about how things are progressing and specifically about infection. I do not feel like it's the deeper but the area of depth is definitely widening  which does have me concerned. No fevers, chills, nausea, or vomiting noted at this time. I think that we do need initiate antibiotic therapy the patient has an allow allergy to amoxicillin/penicillin he states that he gets a rash since childhood. Nonetheless she's never had the issues with Catholics or cephalosporins in general but he is aware of. 07/27/18 on evaluation today patient presents following admission to the hospital on 07/09/18. He was subsequently discharged on 07/20/18. On 07/15/18 the patient underwent irrigation and debridement was soft tissue biopsy and bone biopsy as well as placement of a Wound VAC in the OR by Dr. Celine Ahr. During the hospital course the patient was placed on a Wound VAC and recommended follow up with surgery in three weeks actually with Dr. Delaine Lame who is infectious disease. The patient was on vancomycin during the hospital course. 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 He also had a bone culture which revealed evidence of methicillin-resistant staph aureus. He is updated CT scan 07/09/18 reveals that he had progression of the which was performed on wound to breakdown down to the trochanter where he actually had irregularities there as well suggestive of osteomyelitis. This was a change just since 9 December when we last performed a CT scan. Obviously this one had gone downhill quite significantly and rapidly. At this point upon evaluation I feel like in general the patient's wound seems to be doing fairly well all things considered upon my evaluation today. Obviously this is larger and deeper than what I previously evaluated but at the same time he seems to be making some progress as far as the appearance of the granulation tissue is concerned. I'm happy in that regard. No fevers, chills, nausea, or vomiting noted at this time. He is on IV vancomycin and Rocephin at the facility. He is currently in [employer]. 08/03/18 upon evaluation today patient's wound appears to be doing better in regard to the overall appearance at this point in time. Fortunately he's been tolerating the Wound VAC without complication and states that the facility has been taking excellent care of the wound site. Overall I see some Slough noted on the surface which I am going to attempt sharp debridement today of but nonetheless other than this I feel like he's making progress. 08/09/18 on evaluation today patient's wound appears to be doing much better compared to even last week's evaluation. Do believe that the Wound VAC is been of great benefit for him. He has been tolerating the dressing changes that is the Wound RAYWOOD, AXON. ([id number]) VAC without any complication and he has excellent granulation noted currently. There is no need for sharp debridement at this point. 08/16/18 on evaluation today patient actually appears to be doing very well in regard to the wound in the right gluteal fold region. This is showing signs of progress and again appears to be very healthy which is excellent news. Fortunately there is no sign of active infection  by way of odor or drainage at this point. Overall I'm very pleased with how things stand. He seems to be tolerating the Wound VAC without complication. 08/23/18 on evaluation today patient actually appears to be doing better in regard to his wound. He has been tolerating the Wound VAC without complication and in fact it has been collecting a significant amount of drainage which I think is good news especially considering how the wound appears. Fortunately there is no signs of infection at this time definitely nothing appears to be worse which is good news. He has not been started on the Bactrim and Flagyl that was recommended by Dr. Delaine Lame yet. I did actually contact her office this morning in order to check and see were things are that regard their gonna be calling me  back. 08/30/18 on evaluation today patient actually appears to show signs of excellent improvement today compared to last evaluation. The undermining is getting much better the wound seems to be feeling quite nicely and I'm very pleased that the granulation in general. With that being said overall I feel like the patient has made excellent progress which is great news. No fevers, chills, nausea, or vomiting noted at this time. 09/06/18 on evaluation today patient actually appears to be doing rather well in regard to his right gluteal ulcer. This is showing signs of improvement in overall I'm very pleased with how things seem to be progressing. The patient likewise is please. Overall I see no evidence of infection he is about to complete his oral antibiotic regimen which is the end of the antibiotics for him in just about three days. 09/13/18 on evaluation today patient's right Ischial ulcer appears to be showing signs of continued improvement which is excellent news. He's been tolerating the dressing changes without complication. Fortunately there's no signs of infection and the wound that seems to be doing very well. 09/28/18 on evaluation today patient appears to be doing rather well in regard to his right Ischial ulcer. He's been tolerating the Wound VAC without complication he knows there's much less drainage than there used to be this obviously is not a bad thing in my pinion. There's no evidence of infection despite the fact is but nothing about it now for several weeks. 10/04/18 on evaluation today patient appears to be doing better in regard to his right Ischial wound. He has been tolerating the Wound VAC without complication and I do believe that the silver nitrate last week was beneficial for him. Fortunately overall there's no evidence of active infection at this time which is great news. No fevers, chills, nausea, or vomiting noted at this time. 10/11/18 on evaluation today patient actually  appears to be doing rather well in regard to his Ischial ulcer. He's been tolerating the Wound VAC still without complication I feel like this is doing a good job. No fevers, chills, nausea, or vomiting noted at this time. 11/01/18 on evaluation today patient presents after having not been seen in our clinic for several weeks secondary to the fact that he was on evaluation today patient presents after having not been seen in our clinic for several weeks secondary to the fact that he was in a skilled nursing facility which was on lockdown currently due to the covert 19 national emergency. Subsequently he was discharged from the facility on this past Friday and subsequently made an appointment to come in to see yesterday. Fortunately there's no signs of active infection at this time which is good news  and overall he does seem to have made progress since I last saw. Overall I feel like things are progressing quite nicely. The patient is having no pain. 11/08/18 on evaluation today patient appears to be doing okay in regard to his right gluteal ulcer. He has been utilizing a Wound VAC home health this changing this at this point since he's home from the skilled nursing facility. Fortunately there's no signs of obvious active infection at this time. Unfortunately though there's no obvious active infection he is having some maceration and his wife states that when the sheets of the Wound VAC office on Sunday when it broke seal that he ended up having significant issues with some smell as well there concerned about the possibility of infection. Fortunately there's No fevers, chills, nausea, or vomiting noted at this time. 11/15/18 on evaluation today patient actually appears to be doing well in regard to his right gluteal ulcer. He has been tolerating the dressing changes without complication. Specifically the Wound VAC has been utilized up to this point. Fortunately there's no signs of infection and overall  I feel like he has made progress even since last week when I last saw him. I'm actually fairly happy with the overall appearance although he does seem to have somewhat of a hyper granular Boissonneault, Nilson J. ([id number]) overgrowth in the central portion of the wound which I think may require some sharp debridement to try flatness out possibly utilizing chemical cauterization following. 11/23/18 on evaluation today patient actually appears to be doing very well in regard to his sacral ulcer. He seems to be showing signs of improvement with good granulation. With that being said he still has the small area of hyper granulation right in the central portion of the wound which I'm gonna likely utilize silver nitrate on today. Subsequently he also keeps having a leak at the 6 o'clock location which is unfortunate we may be able to help out with some suggestions to try to prevent this going forward. Fortunately there's no signs of active infection at this time. 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 He's been tolerating the dressing changes without complication. Fortunately there's no signs of active infection at this time. I've been rather pleased with how things have progressed there still some evidence of pressure getting to the area with some redness right around the immediate wound opening. Nonetheless other than this I'm not seeing any significant complications or issues the wound is somewhat hyper granular. Upon discussing with the patient and his wife today I'm not sure that the wound is being packed to the base with the foam at this point. And if it's not been packed fully that may be part of the reason why is not seen as much improvement as far as the granulation from the base out. Again we do not want pack too tightly but we need some of the firm to get to the base of the wound. I discussed this  with patient and his wife today. 12/06/18 on evaluation today patient appears to be doing well in regard to his right gluteal pressure ulcer. He's been tolerating the dressing changes without complication. Fortunately there's no signs of active infection. He still has some hyper granular tissue and I do think it would be appropriate to continue with the chemical cauterization as of today. 12/16/18 on evaluation today patient actually appears to be doing okay in  regard to his right gluteal ulcer. He is been tolerating the dressing changes without complication including the Wound VAC. Overall I feel like nothing seems to be worsening I do feel like that the hyper granulation buds in the central portion of the wound have improved to some degree with the silver nitrate. We will have to see how things continue to progress. 12/20/18 on evaluation today patient actually appears to be doing much worse in my pinion even compared to last week's evaluation. Unfortunately as opposed to showing any signs of improvement the areas of hyper granular tissue in the central portion of the wound seem to be getting worse. Subsequently the wound bed itself also seems to be getting deeper even compared to last week which is both unusual as well as concerning since prior he had been shown signs of improvement. Nonetheless I think that the issue could be that he's actually having some difficulty in issues with a deeper infection. There's no external signs of infection but nonetheless I am more worried about the internal, osteomyelitis, that could be restarting. He has not been on antibiotics for some time at this point. I think that it may be a good idea to go ahead and started back on an antibiotic therapy while we wait to see what the testing shows. 12/27/18 on evaluation today patient presents for follow-up concerning his left gluteal fold wound. Fortunately he appears to be doing well today. I did review the CT scan which was  negative for any signs of osteomyelitis or acute abnormality this is excellent news. Overall I feel like the surface of the wound bed appears to be doing significantly better today compared to previously noted findings. There does not appear any signs of infection nor does he have any pain at this time. 01/03/19 on evaluation today patient actually appears to be doing quite well in regard to his ulcer. Post debridement last week he really did not have too much bleeding which is good news. Fortunately today this seems to be doing some better but we still has some of the hyper granular tissue noted in the base of the wound which is gonna require sharp debridement today as well. Overall I'm pleased with how things seem to be progressing since we switched away from the Wound VAC I think he is making some progress. 01/10/19 on evaluation today patient appears to be doing better in regard to his right gluteal fold ulcer. He has been tolerating the dressing changes without complication. The debridement to seem to be helping with current away some of the poor hyper granular tissue bugs throughout the region of his gluteal fold wound. He's been tolerating the dressing changes otherwise without complication which is great news. No fevers, chills, nausea, or vomiting noted at this time. 01/17/19 on evaluation today patient actually appears to be doing excellent in regard to his wound. He's been tolerating the dressing changes without complication. Fortunately there is no signs of active infection at this time which is great news. No fevers, chills, nausea, or vomiting noted at this time. 01/24/19 on evaluation today patient actually appears to be doing quite well with regard to his ulcer. He has been tolerating the dressing changes without complication. Fortunately there's no signs of active infection at this time. Overall been very pleased with the progress that he seems to be making currently. 01/31/19-Patient  returns at 1 week with apparent similarity in dimensions to the wound, with no signs of infection, he has been Wieland, Williston (  [id number]) changing dressings twice a day 02/08/19 upon evaluation today patient actually appears to be doing well with regard to his right Ischial ulcer. The wound is not appear to be quite as deep and seems to be making progress which is good news. With that being said I'm still reluctant to go back to the Wound VAC at this point. He's been having to change the dressings twice a day which is a little bit much in my pinion from the wound care supplies standpoint. I think that possibly attempting to utilize extras orbit may be beneficial this may also help to prevent any additional breakdown secondary to fluid retention in the wound itself. The patient is in agreement with giving this a try. 02/15/19 on evaluation today patient actually appears to be doing decently well with regard to his ulcer in the right to gluteal fold location. He's been tolerating the dressing changes without complication. Fortunately there is no signs of active infection at this time. He is able to keep the current dressing in place more effectively for a day at a time whereas before he was having a changes to to three times a day. The actions or has been helpful in this regard. Fortunately there's no signs of anything getting worse and I do feel like he showing signs of good improvement with regard to the wound bed status. 02/22/2019 patient appears to be doing very well today with regard to his ulcer in the gluteal fold. Fortunately there is no signs of active infection and he has been tolerating the dressing changes without any complication. Overall extremely pleased with how things seem to be progressing. He has much less of the hyper granular projections within the wound these have slowly been debrided away and he seems to be doing well. The wound bed is more uniform. 03/01/19 on evaluation  today patient appears to be doing unfortunately about the same in regard to his gluteal ulcer. He's been tolerating the dressing changes without complication. Fortunately there's no signs of active infection at this time. With that being said he continues to develop these hyper granular projections which I'm unsure of exactly what they are and why they are rising. Nonetheless I explained to the patient that I do believe it would be a good idea for [place] to stand a biopsy sample for pathology to see if that can shed any light on what exactly may be going on here. Fortunately I do not see any obvious signs of infection. With that being said the patient has had a little bit more drainage this week apparently compared to last week. 03/08/2019 on evaluation today patient actually appears to look somewhat better with regard to the appearance of his wound bed at this time. This is good news. Overall I am very pleased with how things seem to have progressed just in the past week with a switch to the [facility] dressing. I think that has been beneficial for him. With that being said at this time the patient is concerned about his biopsy that I sent off last week unfortunately I do not have that report as of yet. Nonetheless we have called to obtain this and hopefully will hear back from the lab later this morning. 03/15/19 on evaluation today patient's wound actually appears to be doing okay today with regard to the overall appearance of the wound bed. He has been tolerating the dressing changes without complication he still has hyper granular tissue noted but fortunately that seems to be minimal  at this point compared to some of what we've seen in the past. Nonetheless I do think that he is still having some issues currently with some of this type of granulation the biopsy and since all showed nothing more than just evidence of granulation tissue. Therefore there really is nothing different to initiate or do  at this point. 03/24/19 on evaluation today patient appears to be doing a little better with regard to his ulcer. He's been tolerating the dressing changes without complication. Fortunately there is no signs of active infection at this time. No fevers, chills, nausea, or vomiting noted at this time. I'm overall pleased with how things seem to be progressing. 03/29/2019 on evaluation today patient appears to be doing about the same in regard to his ulcer in the right gluteal fold. Unfortunately he is not seeming to make a lot of progress and the wound is somewhat stalled. There is no signs of active infection externally but I am concerned about the possibility of infection continuing in the ischial location which previously he did have infection noted. Again is not able to have an MRI so probably our best option for testing for this would be a triple phase bone scan which will detect subtle changes in the bone more so than plain film x-rays. In the past they really have not been beneficial and in fact the CT scans even have been somewhat questionable at times. Nonetheless there is no signs of systemic infection which is at least good news but again his wound is not healing at all on the predicted schedule. 04/05/19 on evaluation today patient appears to be doing well all things considering with regard to his wound and the right gluteal fold. He actually has his triple bone scan scheduled for sometime in the next couple of days. With that being said I've also been looking to other possibilities of what could be causing hyper granular tissue were looking into the bone scan again for evaluating for the risk or possibility of infection deeper and I'm also gonna go ahead and see about obtaining a deep tissue culture today to send and see if there's any evidence of infection noted on culture. He's in agreement with that plan. 04/12/2019 on evaluation today patient presents for reevaluation here in our clinic  concerning ongoing issues with his right gluteal fold ulcer. 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 It was discussed with him at that point that I think it would be appropriate HAYSTON, PINGREE. ([id number]) for him to proceed with hyperbaric oxygen therapy especially in light of the fact that he is previously been on IV antibiotics at the beginning of the year for close to 3 months followed by several weeks of oral antibiotics that was all prescribed by infectious disease. He had surgical debridement around Christmas December 2019 due to an abscess and osteomyelitis of the ischial bone. Unfortunately this has not really proceeded as well as we would have liked and again we did a CT scan even a couple months ago as he cannot have an MRI secondary to having issues with both a pain pump as well as a spinal cord stimulator which prevent him from going into an MRI machine. With that being said there were chronic changes noted at the ischial tuberosity which had progressed since December 2019 there was no evidence of fluid collection on that initial CT scan.  With that being said that was on January 21, 2019. I am not sure that it was a sensitive enough test as compared to an MRI and then subsequently I ordered a bone scan for him which was actually completed on 03/29/2019 and this revealed that he does indeed have positive osteomyelitis involving the right ischial tuberosity. This is adjacent to the ulcer and I think is the reason that his ulcer is not healing. Subsequently I am in a place him back on oral antibiotics today unfortunately his wound culture showed just mixed gram-negative flora with no specific findings of a predominant organism. Nonetheless being with the location I think a good broad-spectrum antibiotic for gram-negative's is a good choice at this point he is previously  taken Levaquin without significant issues and I think that is an appropriate antibiotic for him at this time. 04/19/2019 on evaluation today patient actually appears to be doing fairly well with regard to his wound. He has been tolerating the dressing changes without complication. Fortunately there is no signs of active infection and he has been taking the oral antibiotics at this time. Subsequently we did make a referral to infectious disease although Dr. Steva Ready wants the patient to be seen by general surgery first in order apparently to see if there is anything from a surgical standpoint that should be done prior to initiation of IV antibiotic therapy. Again the patient is okay with actually seeing Dr. Celine Ahr whom he has seen before we will make that referral. 04/26/2019 on evaluation today patient actually appears to be doing well with regard to his wound. He has been tolerating the dressing changes without complication. Fortunately there is no signs of active infection at this time. I do believe that the hyperbaric oxygen therapy along with the antibiotics which I prescribed at this point have been doing well for him. With that being said he has not seen Dr. Celine Ahr the surgeon yet in fact they have not been contacted for scheduling an appointment as of yet. Subsequently the patient is not on antibiotics currently by IV Dr. Steva Ready did want him to see Dr. Celine Ahr first which we are working on trying to get scheduled. We again give the information to the patient today for Dr. Celine Ahr and her number as far as contacting their office to see about getting something scheduled. Again were looking for whether or not they recommend any surgical intervention. 05/03/2019 on evaluation today patient appears to be doing about the same with regard to his wound there may be a little bit of filling in in regard to the base of the wound but still he has quite a bit of hyper granular tissue that I am seeing  today. I believe that he may may need a more aggressive sharp debridement possibly even by the surgeon or under anesthesia in order to clear away some of the hyper granular material in order to help allow for appropriate granulation to fill in. We have made a referral to Dr. Celine Ahr unfortunately it sounds as if they did not receive the referral we contacted them today they should be get in touch with the patient. Depending on how things show from that standpoint the patient may need to see Dr. Ardyth Gal who is the infectious disease specialist although he really does not want a PICC line again. No fevers, chills, nausea, vomiting, or diarrhea. He is tolerating hyperbaric oxygen therapy very well. 05/12/2019 on evaluation today patient presents for follow-up visit concerning his right gluteal fold ulcer. He  did see Dr. Celine Ahr the surgeon who previously evaluated his wound and she actually felt like he was doing quite well with regard to the wound based on what she was seen. He does seem to be responding to some degree with the oral antibiotics along with hyperbaric oxygen therapy at this point she did not see any evidence or need for further surgical intervention at this point. She recommended deferring additional or ongoing antibiotic therapy to Dr. Steva Ready at her discretion. Fortunately there is no signs of active systemic infection. No fevers, chills, nausea, vomiting, or diarrhea. 10/29; patient I am seeing really for the first time today in terms of his wound. 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 He is being treated with hyperbaric oxygen for underlying osteomyelitis most recently documented by a three-phase bone scan. He has been on Levaquin for roughly a month and he is out of these and asked me for a refill. Most recent cultures were negative although I do not think these were bone cultures. He has a very irregular surface to this wound which is almost  nodular in texture. It undermines superiorly with the same nodular hyper granulated surface. I see that he was referred to general surgery for an operative debridement although the surgeon did not agree with this I think that would have been the proper course of action in this. He has been using Hydrofera Blue. He is supposed to start a wound VAC tomorrow which is actually a reapplication of wound VAC. I think mostly last time they had trouble keeping the seal in place I hope we have better look at this this time. 05/24/2019 on evaluation today I did review patient's note from Dr. Dellia Nims where he was seen last week when I was out of the office. Subsequently it does appear that Dr. Dellia Nims was in agreement that the patient really needed debridement to clear away some of the nodular tissue in the base of the wound. The good news is he has the wound VAC initiated and some of this tissue is already starting to breakdown under the treatment of the wound VAC again because it is not really viable. DOLTON, LARDNER ([id number]) Nonetheless I do believe that we can likely perform some debridement today to clear this away and hopefully the continuation of the wound VAC along with hyperbarics and antibiotic therapy will be beneficial in stopping this from reoccurring. If we get better control in the wound bed in a good spot I think we can definitely use the PriMatrix which we actually did get approval for. 05/31/2019 on evaluation today patient appears to be doing actually pretty well at this point with regard to his wound. He has been tolerating the dressing changes which is good news. Fortunately there is no signs of infection. He still has some of the hyper granular nodules noted we have not really cleared all these away and I think we can try to do a little bit more that today. I did do quite a bit last week I am hoping to get down to the base of the wound so though actually be able to heal  more appropriately. I think the wound VAC is doing a good job right now with controlling the drainage and overall the patient is happy with that. 06/07/2019 upon evaluation today patient appears to be doing quite well compared to last week with regard to the hyper granulation in the base of the wound. He has been tolerating the  dressing changes without complication. Fortunately there is no signs of active infection at this time. With that being said we have not had any recent measures as far as blood work is concerned I think we may want to check a few things including a CBC, sed rate, and C-reactive protein. Patient is in agreement with that we can try to get that scheduled for him for this Friday. Electronic Signature(s) Signed: 06/07/2019 5:28:53 PM By: Worthy Keeler [name] Entered By: Worthy Keeler on 06/07/2019 17:28:52 ANOTHY, PURIFOY ([id number]) -------------------------------------------------------------------------------- [facility] TISS Details Patient Name: [name] Date of Service: 06/07/2019 10:30 AM Medical Record Number: [id number] Patient Account Number: [id number] Date of Birth/Sex: May 15, 1951 (68 y.o. M) Treating RN: Montey Hora Primary Care Provider: [name] Other Clinician: [name] Referring Provider: [name] Treating Provider/Extender: [name], HOYT Weeks in Treatment: 72 Procedure Performed for: Wound #1 Right Gluteal fold Performed By: Physician [name], HOYT E., [name] Post Procedure Diagnosis Same as Pre-procedure Notes 4 sticks of silver nitrate used Electronic Signature(s) Signed: 06/07/2019 11:09:01 AM By: Montey Hora Entered By: Montey Hora on 06/07/2019 11:09:01 JORDANI, SALM ([id number]) -------------------------------------------------------------------------------- Physical Exam Details Patient Name: [name] Date of Service: 06/07/2019 10:30 AM Medical Record  Number: [id number] Patient Account Number: [id number] Date of Birth/Sex: Mar 09, 1951 (68 y.o. M) Treating RN: Montey Hora Primary Care Provider: [name] Other Clinician: [name] Referring Provider: [name] Treating Provider/Extender: [name], HOYT Weeks in Treatment: 34 Constitutional Well-nourished and well-hydrated in no acute distress. Respiratory normal breathing without difficulty. clear to auscultation bilaterally. Cardiovascular regular rate and rhythm with normal S1, S2. Psychiatric this patient is able to make decisions and demonstrates good insight into disease process. Alert and Oriented x 3. pleasant and cooperative. Notes Patient's wound bed upon inspection at this time actually is showing signs of still having some mild hyper granulation for that reason I did actually perform chemical cauterization with silver nitrate which he tolerated without having any pain last week after the aggressive sharp debridement that I did perform try to clear away some of this hyper granulation he tells me he did have some discomfort for the next couple of days. Fortunately this was not too severe but following he did seem to be doing much better. Overall I am hoping the silver nitrate today will be of benefit I did use 4 sticks of silver nitrate to perform the chemical cauterization at this point. Electronic Signature(s) Signed: 06/07/2019 5:29:51 PM By: Worthy Keeler [name] Entered By: Worthy Keeler on 06/07/2019 17:29:51 KARLON, RUMBLEY ([id number]) -------------------------------------------------------------------------------- Physician Orders Details Patient Name: [name] Date of Service: 06/07/2019 10:30 AM Medical Record Number: [id number] Patient Account Number: [id number] Date of Birth/Sex: 04-12-1951 (68 y.o. 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 - in office o Cleanse wound with mild soap and water Skin Barriers/Peri-Wound Care o Skin Prep Primary Wound Dressing Wound #1 Right Gluteal fold o Silver Collagen - under NPWT o Other: - in clinic - silvercel,  xtrasorb, tegaderm Dressing Change Frequency Wound #1 Right Gluteal fold o Change Dressing Monday, Wednesday, Friday Follow-up Appointments Wound #1 Right Gluteal fold o Return Appointment in 1 week. - on Monday Off-Loading Wound #1 Right Gluteal fold o Turn and reposition every 2 hours Home Health Wound #1 Right Gluteal fold o [place] Nurse may visit PRN to address patientos wound care needs. o FACE TO FACE ENCOUNTER: MEDICARE and MEDICAID PATIENTS: I certify that this patient is under my care and that I had a face-to-face encounter that meets the physician face-to-face encounter requirements with this patient on this date. The encounter with the patient was in whole or in part for the following [place] ([id number]) CONDITION: (primary reason for Home Healthcare) MEDICAL NECESSITY: I certify, that based on my findings, NURSING services are a medically necessary home health service. HOME BOUND STATUS: I certify that my clinical findings support that this patient is homebound (i.e., Due to illness or injury, pt requires aid of supportive devices such as crutches, cane, wheelchairs,  walkers, the use of special transportation or the assistance of another person to leave their place of residence. There is a normal inability to leave the home and doing so requires considerable and taxing effort. Other absences are for medical reasons / religious services and are infrequent or of short duration when for other reasons). o If current dressing causes regression in wound condition, may D/C ordered dressing product/s and apply Normal Saline Moist Dressing daily until next [place] / Other MD appointment. [place] of regression in wound condition at [phone]. o Please direct any NON-WOUND related issues/requests for orders to patient's Primary Care Physician Negative Pressure Wound Therapy Wound #1 Right Gluteal fold o Wound VAC settings at 125/130 mmHg continuous pressure. Use BLACK/GREEN foam to wound cavity. Use WHITE foam to fill any tunnel/s and/or undermining. Change VAC dressing 3 X WEEK. Change canister as indicated when full. Nurse may titrate settings and frequency of dressing changes as clinically indicated. o Home Health Nurse may d/c VAC for s/s of increased infection, significant wound regression, or uncontrolled drainage. [place] at [phone]. o Apply contact layer over base of wound. - apply prisma/silver collagen to the base of the wound o Number of foam/gauze pieces used in the dressing = Laboratory o CBC W Auto Differential panel in Blood (HEM-CBC) - [facility] to draw CBC with diff, Sed Rate and CRP on Friday 06/10/19 and fax results to [facility] at [place] Code: [id number] oooo Convenience Name: CBC W Auto Differential panel Electronic Signature(s) Signed: 06/07/2019 4:34:50 PM By: Montey Hora Signed: 06/07/2019 6:03:25 PM By: Worthy Keeler [name] Previous Signature: 06/07/2019 11:08:32 AM Version By: Montey Hora Entered By: Montey Hora on 06/07/2019 11:32:00 MICKLE, DOVIDIO ([id number]) -------------------------------------------------------------------------------- Problem List Details Patient Name: [name] Date of Service: 06/07/2019 10:30 AM Medical Record Number: [id number] Patient Account Number: [id number] Date of Birth/Sex: 1951-02-17 (68 y.o. M) Treating RN: Montey Hora Primary Care Provider: [name] Other Clinician: [name] Referring Provider: [name] Treating Provider/Extender: [name], HOYT Weeks in Treatment: 48 Active Problems ICD-10 Evaluated Encounter Code Description Active Date Today Diagnosis L89.314 Pressure ulcer of right buttock, stage 4 11/30/2017 No Yes M86.68 Other chronic osteomyelitis, other site 04/08/2019 No Yes L03.317 Cellulitis of buttock 06/21/2018 No Yes G82.20 Paraplegia, unspecified 11/30/2017 No Yes S34.109S Unspecified injury to unspecified level of lumbar spinal cord, 11/30/2017 No  Yes sequela I10 Essential (primary) hypertension 11/30/2017 No Yes Inactive Problems Resolved Problems Electronic Signature(s) Signed: 06/07/2019 10:56:32 AM By: Worthy Keeler [name] Entered By: Worthy Keeler on 06/07/2019 10:56:31 ULYSS, CLENDENIN ([id number]) -------------------------------------------------------------------------------- Progress Note Details Patient Name: [name] Date of Service: 06/07/2019 10:30 AM Medical Record Number: [id number] Patient Account Number: [id number] Date of Birth/Sex: 01/01/1951 (68 y.o. 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 He states that there is no history of diabetes that he is aware of. He does have issues with his prostate and is currently receiving treatment for this by way of oral medication. With that being said I do not have a lot of details in that regard. Nonetheless the patient presents today as a result of having been referred to [place] by another provider initially home health was set to come out and take care of his wound although due to the fact that he apparently drives he's not able to receive home health. His wife is therefore trying to help take care of this wound within although they have been struggling with what exactly to do at this point. She states that she can do some things but she is definitely not a nurse and does have some issues with looking at blood. The good news is the wound does not appear to be too deep and is fairly superficial at this point. There is no slough noted there is some nonviable skin noted around the surface of the wound and the perimeter at this point. The central portion of the wound appears to be very good with a dermal layer noted this does not appear to be again deep enough to extend it to subcutaneous tissue at this point. 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 In the pump he has Dilaudid and baclofen. 12/07/17 on evaluation today patient presents for follow-up concerning his ongoing lower back thigh ulcer on the right. He states that he did not get the supplies ordered and therefore has not really been able to perform the dressing changes as directed exactly. His wife was able to get some Boarder Foam Dressing's from the drugstore and subsequently has been using hydrogel which did help to a degree in the wound does  appear to be able smaller. There is actually more drainage this week noted than previous. 12/21/17 on evaluation today patient appears to be doing rather well in regard to his right gluteal ulcer. He has been tolerating the dressing changes without complication. There does not appear to be any evidence of infection at this point in time. Overall the wound does seem to be making some progress as far as the edges are concerned there's not as much in the way of overlapping of the external wound edges and he has a good epithelium to wound bed border for the most part. This however is not true right at the 12 o'clock location over the span of  a little over a centimeters which actually will require debridement today to clean this away and hopefully allow it to continue to heal more appropriately. 12/28/17 on evaluation today patient appears to be doing rather well in regard to his ulcer in the left gluteal region. He's been tolerating the dressing changes without complication. Apparently he has had some difficulty getting his dressing material. Apparently there's been some confusion with ordering we're gonna check into this. Nonetheless overall he's been showing signs of improvement which is good news. Debridement is not required today. 01/04/18 on evaluation today patient presents for follow-up concerning his right gluteal ulcer. He has been tolerating the dressing changes fairly well. On inspection today it appears he may actually have some maceration them concerned about the fact that he may be developing too much moisture in and around the wound bed which can cause delay in healing. With that being said he unfortunately really has not showed significant signs of improvement since last week's evaluation in fact this may even be just the little bit/slightly larger. Nonetheless he's been having a lot of discomfort I'm not sure this is even related to the wound as he has no pain when I'm to breeding or  otherwise cleaning the wound during evaluation today. Nonetheless this is something that we did recommend he talked to his pain specialist concerning. 01/11/18 on evaluation today patient appears to be doing better in regard to his ulceration. He has been tolerating the dressing Shreeve, Lyndle J. ([id number]) changes without complication. With that being said overall there's no evidence of infection which is good news. The only thing is he did receive the hatch affair blue classic versus the ready nonetheless I feel like this is perfectly fine and appears to have done well for him over the past week. 01/25/18 on evaluation today patient's wound actually appears to be a little bit larger than during the last evaluation. The good news is the majority of the wound edges actually appear to be fairly firmly attached to the wound bed unfortunately again we're not really making progress in regard to the size. Roughly the wound is about the same size as when I first saw him although again the wound margin/edges appear to be much better. 02/01/18 on evaluation today patient actually appears to be doing very well in regard to his wound. Applying the Prisma dry does seem to be better although he does still have issues with slow progression of the wound. There was a slight improvement compared to last week's measurements today. Nonetheless I have been considering other options as far as the possibility of Theraskin or even a snap vac. In general I'm not sure that the Theraskin due to location of the wound would be a very good idea. Nonetheless I do think that a snap vac could be a possibility for the patient and in fact I think this could even be an excellent way to manage the wound possibly seeing some improvement in a very rapid fashion here. Nonetheless this is something that we would need to get approved and I did have a lengthy conversation with the patient about this today. 02/08/18 on evaluation today  patient appears to be doing a little better in regard to his ulcer. He has been tolerating the dressing changes without complication. Fortunately despite the fact that the wound is a little bit smaller it's not significantly so unfortunately. We have discussed the possibility of a snap vac we did check with insurance this is actually covered at  this point. Fortunately there does not appear to be any sign of infection. Overall I'm fairly pleased with how things seem to be appearing at this point. 02/15/18 on evaluation today patient appears to be doing rather well in regard to his right gluteal ulcer. Unfortunately the snap vac did not stay in place with his sheer and friction this came loose and did not seem to maintain seal very well. He worked for about two days and it did seem to do very well during that time according to his wife but in general this does not seem to be something that's gonna be beneficial for him long-term. I do believe we need to go back to standard dressings to see if we can find something that will be of benefit. 03/02/18- He is here in follow up evaluation; there is minimal change in the wound. He will continue with the same treatment plan, would consider changing to iodosrob/iodoflex if ulcer continues to to plateau. He will follow up next week 03/08/18 on evaluation today patient's wound actually appears to be about the same size as when I previously saw him several weeks back. Unfortunately he does have some slightly dark discoloration in the central portion of the wound which has me concerned about pressure injury. I do believe he may be sitting for too long a period of time in fact he tells me that "I probably sit for much too long". He does have some Slough noted on the surface of the wound and again as far as the size of the wound is concerned I'm really not seeing anything that seems to have improved significantly. 03/15/18 on evaluation today patient appears to be doing  fairly well in regard to his ulcer. The wound measured pretty much about the same today compared to last week's evaluation when looking at his graph. With that being said the area of bruising/deep tissue injury that was noted last week I do not see at this point. He did get a new cushion fortunately this does seem to be have been of benefit in my pinion. It does appear that he's been off of this more which is good news as well I think that is definitely showing in the overall wound measurements. With that being said I do believe that he needs to continue to offload I don't think that the fact this is doing better should be or is going to allow him to not have to offload and explain this to him as well. Overall he seems to be in agreement the plan I think he understands. The overall appearance of the wound bed is improved compared to last week I think the Iodoflex has been beneficial in that regard. 03/29/18 on evaluation today patient actually appears to be doing rather well in regard to his wound from the overall appearance standpoint he does have some granulation although there's some Slough on the surface of the wound noted as well. With that being said he unfortunately has not improved in regard to the overall measurement of the wound in volume or in size. I did have a discussion with him very specifically about offloading today. He actually does work although he mainly is just sitting throughout the day. He tells me he offloads by "lifting himself up for 30 seconds off of his chair occasionally" purchase from [employer] which does seem to have helped. And he has a new cushion that he with that being said he's also able to stand some for a very short  period of time but not significant enough I think to provide appropriate offloading. I think the biggest issue at this point with the wound and the fact is not healing as quickly as we would like is due to the fact that he is really not able to  appropriately offload while at work. He states the beginning after his injury he actually had a bed at his job that he could lay on in order to offload and that does seem to have been of help back at that time. Nonetheless he had not done this in quite some time unfortunately. I think that could be helpful for him this is something I would like for him to look into. HONEST, STARE ([id number]) 04/05/18 on evaluation today patient actually presents for follow-up concerning his right gluteal ulcer. Again he really is not significantly improved even compared to last week. He has been tolerating the dressing changes without complication. With that being said fortunately there appears to be no evidence of infection at this time. He has been more proactive in trying to offload. 04/12/18 on evaluation today patient actually appears to be doing a little better in regard to his wound and the right gluteal fold region. He's been tolerating the dressing changes since removing the oasis without complication. However he was having a lot of burning initially with the oasis in place. He's unsure of exactly why this was given so much discomfort but he assumes that it was the oasis itself causing the problem. Nonetheless this had to be removed after about three days in place although even those three days seem to have made a fairly good improvement in regard to the overall appearance of the wound bed. In fact is the first time that he's made any improvement from the standpoint of measurements in about six weeks. He continues to have no discomfort over the area of the wound itself which leads me to wonder why he was having the burning with the oasis when he does not even feel the actual debridement's themselves. I am somewhat perplexed by this. 04/19/18 on evaluation today patient's wound actually appears to be showing signs of epithelialization around the edge of the wound and in general actually appears to be  doing better which is good news. He did have the same burning after about three days with applying the Endoform last week in the same fashion that I would generally apply a skin substitute. This seems to indicate that it's not the oasis to cause the problem but potentially the moisture buildup that just causes things to burn or there may be some other reaction with the skin prep or Steri-Strips. Nonetheless I'm not sure that is gonna be able to tolerate any skin substitute for a long period of time. The good news is the wound actually appears to be doing better today compared to last week and does seem to finally be making some progress. 04/26/18 on evaluation today patient actually appears to be doing rather well in regard to his ulcer in the right gluteal fold. He has been tolerating the dressing changes without complication which is good news. The Endoform does seem to be helping although he was a little bit more macerated this week. This seems to be an ongoing issue with fluid control at this point. Nonetheless I think we may be able to add something like Drawtex to help control the drainage. 05/03/18 on evaluation today patient appears to actually be doing better in regard to the overall appearance  of his wound. He has been tolerating the dressing changes without complication. Fortunately there appears to be no evidence of infection at this time. I really feel like his wound has shown signs as of today of turning around last week I thought so as well and definitely he could be seen in this week's overall appearance and measurements. In general I'm very pleased with the fact that he finally seems to be making a steady but sure progress. The patient likewise is very pleased. 05/17/18 on evaluation today patient appears to be doing more poorly unfortunately in regard to his ulcer. He has been tolerating the dressing changes without complication. With that being said he tells me that in the past  couple of days he and his wife have noticed that we did not seem to be doing quite as well is getting dark near the center. Subsequently upon evaluation today the wound actually does appear to be doing worse compared to previous. He has been tolerating the dressing changes otherwise and he states that he is not been sitting up anymore than he was in the past from what he tells me. Still he has continued to work he states "I'm tired of dealing with this and if I have to just go home and lay in the bed all the time that's what I'll do". Nonetheless I am concerned about the fact that this wound does appear to be deeper than what it was previous. 05/24/18 upon evaluation today patient actually presents after having been in the hospital due to what was presumed to be sepsis secondary to the wound infection. He had an elevated white blood cell count between 14 and 15. With that being said he does seem to be doing somewhat better now. His wound still is giving him some trouble nonetheless and he is obviously concerned about the fact likely talked about that this does seem to go more deeply than previously noted. I did review his wound culture which showed evidence of Staphylococcus aureus him and group B strep. Nonetheless he is on antibiotics, Levaquin, for this. Subsequently I did review his intake summary from the hospital as well. I also did look at the CT of the lumbar spine with contrast that was performed which showed no bone destruction to suggest lumbar disguises/osteomyelitis or sacral osteomyelitis. There was no paraspinal abscess. Nonetheless it appears this may have been more of just a soft tissue infection at this point which is good news. He still is nonetheless concerned about the wound which again I think is completely reasonable considering everything he's been through recently. 05/31/18 on evaluation today on evaluation today patient actually appears to be showing signs of his wound be a  little bit deeper than what I would like to see. Fortunately he does not show any signs of significant infection although his temperature was 99 today he states he's been checking this at home and has not been elevated. Nonetheless with the undermining that I'm seeing at this point I am becoming more concerned about the wound I do think that offloading is a key factor here that is preventing the speedy recovery at this point. There does not appear to be any evidence of again over infection noted. He's been using Santyl currently. AVYUKTH, LEYTON ([id number]) 06/07/18 the patient presents today for follow-up evaluation regarding the left ulcer in the gluteal region. He has been tolerating the Wound VAC fairly well. He is obviously very frustrated with this he states that to mean is really  getting in his way. There does not appear to be any evidence of infection at this time he does have a little bit of odor I do not necessarily associate this with infection just something that we sometimes notice with Wound VAC therapy. With that being said I can definitely catch a tone of discontentment overall in the patient's demeanor today. This when he was previously in the hospital an CT scan was done of the lumbar region which did not reveal any signs of osteomyelitis. With that being said the pelvis in particular was not evaluated distinctly which means he could still have some osteonecrosis I. Nonetheless the Wound VAC was started on Thursday I do want to get this little bit more time before jumping to a CT scan of the pelvis although that is something that I might would recommend if were not see an improvement by that time. 06/14/18 on evaluation today patient actually appears to be doing about the same in regard to his right gluteal ulcer. Again he did have a CT scan of the lumbar spine unfortunately this did not include the pelvis. Nonetheless with the depth of the wound that I'm seeing today even  despite the fact that I'm not seeing any evidence of overt cellulitis I believe there's a good chance that we may be dealing with osteomyelitis somewhere in the right Ischial region. No fevers, chills, nausea, or vomiting noted at this time. 06/21/18 on evaluation today patient actually appears to be doing about the same with regard to his wound. The tunnel at 6 o'clock really does not appear to be any deeper although it is a little bit wider. I think at this point you may want to start packing this with white phone. Unfortunately I have not got approval for the CT scan of the pelvis as of yet due to the fact that Medicare apparently has been denied it due to the diagnosis codes not being appropriate according to Medicare for the test requested. With that being said the patient cannot have an MRI and therefore this is the only option that we have as far as testing is concerned. The patient has had infection and was on antibiotics and been added code for cellulitis of the bottom to see if this will be appropriate for getting the test approved. Nonetheless I'm concerned about the infection have been spread deeper into the Ischial region. 06/28/18 on evaluation today patient actually appears to be doing rather well all things considered in regard to the right gluteal ulcer. He has been tolerating the dressing changes without complication. With that being said the Wound VAC he states does have to be replaced almost every day or at least reinforced unfortunately. Patient actually has his CT scan later this morning we should have the results by tomorrow. 07/05/18 on evaluation today patient presents for follow-up concerning his right Ischial ulcer. He did see the surgeon Dr. Lysle Pearl last week. They were actually very happy with him and felt like he spent a tremendous amount of time with them as far as discussing his situation was concerned. In the end Dr. Lysle Pearl did contact me as well and determine that he  would not recommend any surgical intervention at this point as he felt like it would not be in the patient's best interest based on what he was seeing. He recommended a referral to infectious disease. Subsequently this is something that Dr. Ines Bloomer office is working on setting up for the patient. As far as evaluation today is concerned  the patient's wound actually appears to be worse at this point. I am concerned about how things are progressing and specifically about infection. I do not feel like it's the deeper but the area of depth is definitely widening which does have me concerned. No fevers, chills, nausea, or vomiting noted at this time. I think that we do need initiate antibiotic therapy the patient has an allow allergy to amoxicillin/penicillin he states that he gets a rash since childhood. Nonetheless she's never had the issues with Catholics or cephalosporins in general but he is aware of. 07/27/18 on evaluation today patient presents following admission to the hospital on 07/09/18. He was subsequently discharged on 07/20/18. On 07/15/18 the patient underwent irrigation and debridement was soft tissue biopsy and bone biopsy as well as placement of a Wound VAC in the OR by Dr. Celine Ahr. During the hospital course the patient was placed on a Wound VAC and recommended follow up with surgery in three weeks actually with Dr. Delaine Lame who is infectious disease. The patient was on vancomycin during the hospital course. 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 He also had a bone culture which revealed evidence of methicillin-resistant staph aureus. He is updated CT scan 07/09/18 reveals that he had progression of the which was performed on wound to breakdown down to the trochanter where he actually had irregularities there as well suggestive of osteomyelitis. This was a change just since 9 December when we last performed a CT scan. Obviously this one had gone  downhill quite significantly and rapidly. At this point upon evaluation I feel like in general the patient's wound seems to be doing fairly well all things considered upon my evaluation today. Obviously this is larger and deeper than what I previously evaluated but at the same time he seems to be making some progress as far as the appearance of the granulation tissue is concerned. I'm happy in that regard. No fevers, chills, nausea, or vomiting noted at this time. He is on IV vancomycin and Rocephin at the facility. He is currently in [facility]. 08/03/18 upon evaluation today patient's wound appears to be doing better in regard to the overall appearance at this point in time. Fortunately he's been tolerating the Wound VAC without complication and states that the facility has been taking DEKLIN, KILMARTIN. ([id number]) excellent care of the wound site. Overall I see some Slough noted on the surface which I am going to attempt sharp debridement today of but nonetheless other than this I feel like he's making progress. 08/09/18 on evaluation today patient's wound appears to be doing much better compared to even last week's evaluation. Do believe that the Wound VAC is been of great benefit for him. He has been tolerating the dressing changes that is the Wound VAC without any complication and he has excellent granulation noted currently. There is no need for sharp debridement at this point. 08/16/18 on evaluation today patient actually appears to be doing very well in regard to the wound in the right gluteal fold region. This is showing signs of progress and again appears to be very healthy which is excellent news. Fortunately there is no sign of active infection by way of odor or drainage at this point. Overall I'm very pleased with how things stand. He seems to be tolerating the Wound VAC without complication. 08/23/18 on evaluation today patient actually appears to be doing better in regard to  his wound. He has been tolerating the Wound  VAC without complication and in fact it has been collecting a significant amount of drainage which I think is good news especially considering how the wound appears. Fortunately there is no signs of infection at this time definitely nothing appears to be worse which is good news. He has not been started on the Bactrim and Flagyl that was recommended by Dr. Delaine Lame yet. I did actually contact her office this morning in order to check and see were things are that regard their gonna be calling me back. 08/30/18 on evaluation today patient actually appears to show signs of excellent improvement today compared to last evaluation. The undermining is getting much better the wound seems to be feeling quite nicely and I'm very pleased that the granulation in general. With that being said overall I feel like the patient has made excellent progress which is great news. No fevers, chills, nausea, or vomiting noted at this time. 09/06/18 on evaluation today patient actually appears to be doing rather well in regard to his right gluteal ulcer. This is showing signs of improvement in overall I'm very pleased with how things seem to be progressing. The patient likewise is please. Overall I see no evidence of infection he is about to complete his oral antibiotic regimen which is the end of the antibiotics for him in just about three days. 09/13/18 on evaluation today patient's right Ischial ulcer appears to be showing signs of continued improvement which is excellent news. He's been tolerating the dressing changes without complication. Fortunately there's no signs of infection and the wound that seems to be doing very well. 09/28/18 on evaluation today patient appears to be doing rather well in regard to his right Ischial ulcer. He's been tolerating the Wound VAC without complication he knows there's much less drainage than there used to be this obviously is not a bad  thing in my pinion. There's no evidence of infection despite the fact is but nothing about it now for several weeks. 10/04/18 on evaluation today patient appears to be doing better in regard to his right Ischial wound. He has been tolerating the Wound VAC without complication and I do believe that the silver nitrate last week was beneficial for him. Fortunately overall there's no evidence of active infection at this time which is great news. No fevers, chills, nausea, or vomiting noted at this time. 10/11/18 on evaluation today patient actually appears to be doing rather well in regard to his Ischial ulcer. He's been tolerating the Wound VAC still without complication I feel like this is doing a good job. No fevers, chills, nausea, or vomiting noted at this time. 11/01/18 on evaluation today patient presents after having not been seen in our clinic for several weeks secondary to the fact that he was on evaluation today patient presents after having not been seen in our clinic for several weeks secondary to the fact that he was in a skilled nursing facility which was on lockdown currently due to the covert 19 national emergency. Subsequently he was discharged from the facility on this past Friday and subsequently made an appointment to come in to see yesterday. Fortunately there's no signs of active infection at this time which is good news and overall he does seem to have made progress since I last saw. Overall I feel like things are progressing quite nicely. The patient is having no pain. 11/08/18 on evaluation today patient appears to be doing okay in regard to his right gluteal ulcer. He has been utilizing a  Wound VAC home health this changing this at this point since he's home from the skilled nursing facility. Fortunately there's no signs of obvious active infection at this time. Unfortunately though there's no obvious active infection he is having some maceration and his wife states that when the  sheets of the Wound VAC office on Sunday when it broke seal that he ended up having significant issues with some smell as well there concerned about the possibility of infection. Fortunately there's No fevers, chills, nausea, or vomiting noted at this time. QADRY, BELMONTE ([id number]) 11/15/18 on evaluation today patient actually appears to be doing well in regard to his right gluteal ulcer. He has been tolerating the dressing changes without complication. Specifically the Wound VAC has been utilized up to this point. Fortunately there's no signs of infection and overall I feel like he has made progress even since last week when I last saw him. I'm actually fairly happy with the overall appearance although he does seem to have somewhat of a hyper granular overgrowth in the central portion of the wound which I think may require some sharp debridement to try flatness out possibly utilizing chemical cauterization following. 11/23/18 on evaluation today patient actually appears to be doing very well in regard to his sacral ulcer. He seems to be showing signs of improvement with good granulation. With that being said he still has the small area of hyper granulation right in the central portion of the wound which I'm gonna likely utilize silver nitrate on today. Subsequently he also keeps having a leak at the 6 o'clock location which is unfortunate we may be able to help out with some suggestions to try to prevent this going forward. Fortunately there's no signs of active infection at this time. 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 He's been tolerating the dressing changes without complication. Fortunately there's no signs of active infection at this time. I've been rather pleased with how things have progressed there still some evidence of pressure getting to the area with some redness right around  the immediate wound opening. Nonetheless other than this I'm not seeing any significant complications or issues the wound is somewhat hyper granular. Upon discussing with the patient and his wife today I'm not sure that the wound is being packed to the base with the foam at this point. And if it's not been packed fully that may be part of the reason why is not seen as much improvement as far as the granulation from the base out. Again we do not want pack too tightly but we need some of the firm to get to the base of the wound. I discussed this with patient and his wife today. 12/06/18 on evaluation today patient appears to be doing well in regard to his right gluteal pressure ulcer. He's been tolerating the dressing changes without complication. Fortunately there's no signs of active infection. He still has some hyper granular tissue and I do think it would be appropriate to continue with the chemical cauterization as of today. 12/16/18 on evaluation today patient actually appears to be doing okay in regard to his right gluteal ulcer. He is been tolerating the dressing changes without complication including the Wound VAC. Overall I feel like nothing seems to be worsening I do feel like that the hyper granulation buds in the central portion of the wound have improved to some degree with  the silver nitrate. We will have to see how things continue to progress. 12/20/18 on evaluation today patient actually appears to be doing much worse in my pinion even compared to last week's evaluation. Unfortunately as opposed to showing any signs of improvement the areas of hyper granular tissue in the central portion of the wound seem to be getting worse. Subsequently the wound bed itself also seems to be getting deeper even compared to last week which is both unusual as well as concerning since prior he had been shown signs of improvement. Nonetheless I think that the issue could be that he's actually having some  difficulty in issues with a deeper infection. There's no external signs of infection but nonetheless I am more worried about the internal, osteomyelitis, that could be restarting. He has not been on antibiotics for some time at this point. I think that it may be a good idea to go ahead and started back on an antibiotic therapy while we wait to see what the testing shows. 12/27/18 on evaluation today patient presents for follow-up concerning his left gluteal fold wound. Fortunately he appears to be doing well today. I did review the CT scan which was negative for any signs of osteomyelitis or acute abnormality this is excellent news. Overall I feel like the surface of the wound bed appears to be doing significantly better today compared to previously noted findings. There does not appear any signs of infection nor does he have any pain at this time. 01/03/19 on evaluation today patient actually appears to be doing quite well in regard to his ulcer. Post debridement last week he really did not have too much bleeding which is good news. Fortunately today this seems to be doing some better but we still has some of the hyper granular tissue noted in the base of the wound which is gonna require sharp debridement today as well. Overall I'm pleased with how things seem to be progressing since we switched away from the Wound VAC I think he is making some progress. 01/10/19 on evaluation today patient appears to be doing better in regard to his right gluteal fold ulcer. He has been tolerating the dressing changes without complication. The debridement to seem to be helping with current away some of the poor hyper granular tissue bugs throughout the region of his gluteal fold wound. He's been tolerating the dressing changes otherwise without complication which is great news. No fevers, chills, nausea, or vomiting noted at this time. 01/17/19 on evaluation today patient actually appears to be doing excellent in  regard to his wound. He's been tolerating the dressing changes without complication. Fortunately there is no signs of active infection at this time which is great news. No fevers, chills, nausea, or vomiting noted at this time. LADAINIAN, BIEDRON ([id number]) 01/24/19 on evaluation today patient actually appears to be doing quite well with regard to his ulcer. He has been tolerating the dressing changes without complication. Fortunately there's no signs of active infection at this time. Overall been very pleased with the progress that he seems to be making currently. 01/31/19-Patient returns at 1 week with apparent similarity in dimensions to the wound, with no signs of infection, he has been changing dressings twice a day 02/08/19 upon evaluation today patient actually appears to be doing well with regard to his right Ischial ulcer. The wound is not appear to be quite as deep and seems to be making progress which is good news. With that being said I'm still  reluctant to go back to the Wound VAC at this point. He's been having to change the dressings twice a day which is a little bit much in my pinion from the wound care supplies standpoint. I think that possibly attempting to utilize extras orbit may be beneficial this may also help to prevent any additional breakdown secondary to fluid retention in the wound itself. The patient is in agreement with giving this a try. 02/15/19 on evaluation today patient actually appears to be doing decently well with regard to his ulcer in the right to gluteal fold location. He's been tolerating the dressing changes without complication. Fortunately there is no signs of active infection at this time. He is able to keep the current dressing in place more effectively for a day at a time whereas before he was having a changes to to three times a day. The actions or has been helpful in this regard. Fortunately there's no signs of anything getting worse and I do feel  like he showing signs of good improvement with regard to the wound bed status. 02/22/2019 patient appears to be doing very well today with regard to his ulcer in the gluteal fold. Fortunately there is no signs of active infection and he has been tolerating the dressing changes without any complication. Overall extremely pleased with how things seem to be progressing. He has much less of the hyper granular projections within the wound these have slowly been debrided away and he seems to be doing well. The wound bed is more uniform. 03/01/19 on evaluation today patient appears to be doing unfortunately about the same in regard to his gluteal ulcer. He's been tolerating the dressing changes without complication. Fortunately there's no signs of active infection at this time. With that being said he continues to develop these hyper granular projections which I'm unsure of exactly what they are and why they are rising. Nonetheless I explained to the patient that I do believe it would be a good idea for [place] to stand a biopsy sample for pathology to see if that can shed any light on what exactly may be going on here. Fortunately I do not see any obvious signs of infection. With that being said the patient has had a little bit more drainage this week apparently compared to last week. 03/08/2019 on evaluation today patient actually appears to look somewhat better with regard to the appearance of his wound bed at this time. This is good news. Overall I am very pleased with how things seem to have progressed just in the past week with a switch to the [facility] dressing. I think that has been beneficial for him. With that being said at this time the patient is concerned about his biopsy that I sent off last week unfortunately I do not have that report as of yet. Nonetheless we have called to obtain this and hopefully will hear back from the lab later this morning. 03/15/19 on evaluation today patient's wound  actually appears to be doing okay today with regard to the overall appearance of the wound bed. He has been tolerating the dressing changes without complication he still has hyper granular tissue noted but fortunately that seems to be minimal at this point compared to some of what we've seen in the past. Nonetheless I do think that he is still having some issues currently with some of this type of granulation the biopsy and since all showed nothing more than just evidence of granulation tissue. Therefore there really  is nothing different to initiate or do at this point. 03/24/19 on evaluation today patient appears to be doing a little better with regard to his ulcer. He's been tolerating the dressing changes without complication. Fortunately there is no signs of active infection at this time. No fevers, chills, nausea, or vomiting noted at this time. I'm overall pleased with how things seem to be progressing. 03/29/2019 on evaluation today patient appears to be doing about the same in regard to his ulcer in the right gluteal fold. Unfortunately he is not seeming to make a lot of progress and the wound is somewhat stalled. There is no signs of active infection externally but I am concerned about the possibility of infection continuing in the ischial location which previously he did have infection noted. Again is not able to have an MRI so probably our best option for testing for this would be a triple phase bone scan which will detect subtle changes in the bone more so than plain film x-rays. In the past they really have not been beneficial and in fact the CT scans even have been somewhat questionable at times. Nonetheless there is no signs of systemic infection which is at least good news but again his wound is not healing at all on the predicted schedule. 04/05/19 on evaluation today patient appears to be doing well all things considering with regard to his wound and the right gluteal fold. He actually  has his triple bone scan scheduled for sometime in the next couple of days. With that being said I've also been looking to other possibilities of what could be causing hyper granular tissue were looking into the bone scan again for evaluating for the risk or possibility of infection deeper and I'm also gonna go ahead and see about obtaining a deep tissue Gayman, Jarrod J. ([id number]) culture today to send and see if there's any evidence of infection noted on culture. He's in agreement with that plan. 04/12/2019 on evaluation today patient presents for reevaluation here in our clinic concerning ongoing issues with his right gluteal fold ulcer. 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 It was discussed with him at that point that I think it would be appropriate for him to proceed with hyperbaric oxygen therapy especially in light of the fact that he is previously been on IV antibiotics at the beginning of the year for close to 3 months followed by several weeks of oral antibiotics that was all prescribed by infectious disease. He had surgical debridement around Christmas December 2019 due to an abscess and osteomyelitis of the ischial bone. Unfortunately this has not really proceeded as well as we would have liked and again we did a CT scan even a couple months ago as he cannot have an MRI secondary to having issues with both a pain pump as well as a spinal cord stimulator which prevent him from going into an MRI machine. With that being said there were chronic changes noted at the ischial tuberosity which had progressed since December 2019 there was no evidence of fluid collection on that initial CT scan. With that being said that was on January 21, 2019. I am not sure that it was a sensitive enough test as compared to an MRI and then subsequently I ordered a bone scan for him which  was actually completed on 03/29/2019 and this revealed that he does indeed  have positive osteomyelitis involving the right ischial tuberosity. This is adjacent to the ulcer and I think is the reason that his ulcer is not healing. Subsequently I am in a place him back on oral antibiotics today unfortunately his wound culture showed just mixed gram-negative flora with no specific findings of a predominant organism. Nonetheless being with the location I think a good broad-spectrum antibiotic for gram-negative's is a good choice at this point he is previously taken Levaquin without significant issues and I think that is an appropriate antibiotic for him at this time. 04/19/2019 on evaluation today patient actually appears to be doing fairly well with regard to his wound. He has been tolerating the dressing changes without complication. Fortunately there is no signs of active infection and he has been taking the oral antibiotics at this time. Subsequently we did make a referral to infectious disease although Dr. Steva Ready wants the patient to be seen by general surgery first in order apparently to see if there is anything from a surgical standpoint that should be done prior to initiation of IV antibiotic therapy. Again the patient is okay with actually seeing Dr. Celine Ahr whom he has seen before we will make that referral. 04/26/2019 on evaluation today patient actually appears to be doing well with regard to his wound. He has been tolerating the dressing changes without complication. Fortunately there is no signs of active infection at this time. I do believe that the hyperbaric oxygen therapy along with the antibiotics which I prescribed at this point have been doing well for him. With that being said he has not seen Dr. Celine Ahr the surgeon yet in fact they have not been contacted for scheduling an appointment as of yet. Subsequently the patient is not on antibiotics currently by IV Dr. Steva Ready did want  him to see Dr. Celine Ahr first which we are working on trying to get scheduled. We again give the information to the patient today for Dr. Celine Ahr and her number as far as contacting their office to see about getting something scheduled. Again were looking for whether or not they recommend any surgical intervention. 05/03/2019 on evaluation today patient appears to be doing about the same with regard to his wound there may be a little bit of filling in in regard to the base of the wound but still he has quite a bit of hyper granular tissue that I am seeing today. I believe that he may may need a more aggressive sharp debridement possibly even by the surgeon or under anesthesia in order to clear away some of the hyper granular material in order to help allow for appropriate granulation to fill in. We have made a referral to Dr. Celine Ahr unfortunately it sounds as if they did not receive the referral we contacted them today they should be get in touch with the patient. Depending on how things show from that standpoint the patient may need to see Dr. Ardyth Gal who is the infectious disease specialist although he really does not want a PICC line again. No fevers, chills, nausea, vomiting, or diarrhea. He is tolerating hyperbaric oxygen therapy very well. 05/12/2019 on evaluation today patient presents for follow-up visit concerning his right gluteal fold ulcer. He did see Dr. Celine Ahr the surgeon who previously evaluated his wound and she actually felt like he was doing quite well with regard to the wound based on what she was seen. He does seem to be responding to some degree with the oral antibiotics along with hyperbaric oxygen therapy  at this point she did not see any evidence or need for further surgical intervention at this point. She recommended deferring additional or ongoing antibiotic therapy to Dr. Steva Ready at her discretion. Fortunately there is no signs of active systemic infection. No fevers,  chills, nausea, vomiting, or diarrhea. 10/29; patient I am seeing really for the first time today in terms of his wound. 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 He is being treated with hyperbaric oxygen for underlying osteomyelitis most recently documented by a three-phase bone scan. He has been on Levaquin for roughly a month and he is out of these and asked me for a refill. Most recent cultures were negative although I do not think these were bone cultures. He has a very irregular surface to this wound which is almost nodular in texture. It undermines superiorly with the same nodular hyper granulated surface. I see that he was referred to general surgery for an operative debridement although the surgeon did not agree with this I think that would have been the proper course of action in this. He has been using Hydrofera Blue. He is supposed to start a wound VAC tomorrow which is actually a reapplication of wound VAC. I think mostly last time they had trouble keeping the seal in place I hope we have better look at this this time. RAI, KALAL ([id number]) 05/24/2019 on evaluation today I did review patient's note from Dr. Dellia Nims where he was seen last week when I was out of the office. Subsequently it does appear that Dr. Dellia Nims was in agreement that the patient really needed debridement to clear away some of the nodular tissue in the base of the wound. The good news is he has the wound VAC initiated and some of this tissue is already starting to breakdown under the treatment of the wound VAC again because it is not really viable. Nonetheless I do believe that we can likely perform some debridement today to clear this away and hopefully the continuation of the wound VAC along with hyperbarics and antibiotic therapy will be beneficial in stopping this from reoccurring. If we get better control in the wound bed in a good spot I think we can definitely use the  [employer] which we actually did get approval for. 05/31/2019 on evaluation today patient appears to be doing actually pretty well at this point with regard to his wound. He has been tolerating the dressing changes which is good news. Fortunately there is no signs of infection. He still has some of the hyper granular nodules noted we have not really cleared all these away and I think we can try to do a little bit more that today. I did do quite a bit last week I am hoping to get down to the base of the wound so though actually be able to heal more appropriately. I think the wound VAC is doing a good job right now with controlling the drainage and overall the patient is happy with that. 06/07/2019 upon evaluation today patient appears to be doing quite well compared to last week with regard to the hyper granulation in the base of the wound. He has been tolerating the dressing changes without complication. Fortunately there is no signs of active infection at this time. With that being said we have not had any recent measures as far as blood work is concerned I think we may want to check a few things including a CBC, sed rate, and C-reactive  protein. Patient is in agreement with that we can try to get that scheduled for him for this Friday. Patient History Information obtained from Patient. Family History Hypertension - Father, Stroke - Mother, No family history of Cancer, Diabetes, Heart Disease, Kidney Disease, Lung Disease, Seizures, Thyroid Problems, Tuberculosis. Social History Never smoker, Marital Status - Married, Alcohol Use - Never, Drug Use - No History, Caffeine Use - Daily. Medical History Eyes Patient has history of Cataracts - both removed Denies history of Glaucoma, Optic Neuritis Ear/Nose/Mouth/Throat Denies history of Chronic sinus problems/congestion, Middle ear problems Hematologic/Lymphatic Denies history of Anemia, Hemophilia, Human Immunodeficiency Virus,  Lymphedema Respiratory Denies history of Aspiration, Asthma, Chronic Obstructive Pulmonary Disease (COPD), Pneumothorax, Sleep Apnea, Tuberculosis Cardiovascular Patient has history of Hypertension - takes medication Denies history of Angina, Arrhythmia, Congestive Heart Failure, Coronary Artery Disease, Deep Vein Thrombosis, Hypotension, Myocardial Infarction, Peripheral Arterial Disease, Peripheral Venous Disease, Phlebitis, Vasculitis Gastrointestinal Denies history of Cirrhosis , Colitis, Crohn s, Hepatitis A, Hepatitis B, Hepatitis C Endocrine Denies history of Type I Diabetes, Type II Diabetes Genitourinary Denies history of End Stage Renal Disease Immunological Denies history of Lupus Erythematosus, Raynaud s, Scleroderma Integumentary (Skin) Denies history of History of Burn, History of pressure wounds Perfetti, Jahn J. ([id number]) Musculoskeletal Denies history of Gout, Rheumatoid Arthritis, Osteoarthritis, Osteomyelitis Neurologic Patient has history of Paraplegia - waist down Denies history of Dementia, Neuropathy, Quadriplegia, Seizure Disorder Oncologic Denies history of Received Chemotherapy, Received Radiation Psychiatric Denies history of Anorexia/bulimia, Confinement Anxiety Medical And Surgical History Notes Oncologic Prostate cancer- currently treated with horomone therapy Review of Systems (ROS) Constitutional Symptoms (General Health) Denies complaints or symptoms of Fatigue, Fever, Chills, Marked Weight Change. Respiratory Denies complaints or symptoms of Chronic or frequent coughs, Shortness of Breath. Cardiovascular Denies complaints or symptoms of Chest pain, LE edema. Psychiatric Denies complaints or symptoms of Anxiety, Claustrophobia. Objective Constitutional Well-nourished and well-hydrated in no acute distress. Vitals Time Taken: 10:31 AM, Height: 73 in, Weight: 210 lbs, BMI: 27.7, Temperature: 98.1 F, Pulse: 72 bpm, Respiratory Rate: 16  breaths/min, Blood Pressure: 112/66 mmHg. Respiratory normal breathing without difficulty. clear to auscultation bilaterally. Cardiovascular regular rate and rhythm with normal S1, S2. Psychiatric this patient is able to make decisions and demonstrates good insight into disease process. Alert and Oriented x 3. pleasant and cooperative. General Notes: Patient's wound bed upon inspection at this time actually is showing signs of still having some mild hyper granulation for that reason I did actually perform chemical cauterization with silver nitrate which he tolerated without having any pain last week after the aggressive sharp debridement that I did perform try to clear away some of this hyper granulation he tells me he did have some discomfort for the next couple of days. Fortunately this was not too severe but following he did seem to be doing much better. Overall I am hoping the silver nitrate today will be of benefit I did use 4 sticks of silver nitrate to perform the chemical cauterization at this point. ASHTYN, FRASIER ([id number]) Integumentary (Hair, Skin) Wound #1 status is Open. Original cause of wound was Pressure Injury. The wound is located on the Right Gluteal fold. The wound measures 3cm length x 2cm width x 2cm depth; 4.712cm^2 area and 9.425cm^3 volume. There is muscle and Fat Layer (Subcutaneous Tissue) Exposed exposed. There is no undermining noted, however, there is tunneling at 11:00 with a maximum distance of 4cm. There is a large amount of serous drainage noted. The wound margin is epibole.  There is large (67-100%) pink, hyper - granulation within the wound bed. There is a small (1-33%) amount of necrotic tissue within the wound bed including Adherent Slough. General Notes: Wound is macerated around the edges. 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 An CHEM CAUT GRANULATION TISS procedure was performed by [name], HOYT E., [name]. Post procedure Diagnosis Wound #1: Same as Pre-Procedure Notes: 4 sticks of silver nitrate used Plan Wound Cleansing: Wound #1 Right Gluteal fold: Clean wound with Normal Saline. - in office Cleanse wound with mild soap and water Skin Barriers/Peri-Wound Care: Skin Prep Primary Wound Dressing: Wound #1 Right Gluteal fold: Silver Collagen - under NPWT Other: - in clinic - silvercel, xtrasorb, tegaderm Dressing Change Frequency: Wound #1 Right Gluteal fold: Change Dressing Monday, Wednesday, Friday Follow-up Appointments: Wound #1 Right Gluteal fold: MILUS, RAHLF. ([id number]) Return Appointment in 1 week. - on Monday Off-Loading: Wound #1 Right Gluteal fold: Turn and reposition every 2 hours Home Health: Wound #1 Right Gluteal fold: Trail Nurse may visit PRN to address patient s wound care needs. FACE TO FACE ENCOUNTER: MEDICARE and MEDICAID PATIENTS: I certify that this patient is under my care and that I had a face-to-face encounter that meets the physician face-to-face encounter requirements with this patient on this date. The encounter with the patient was in whole or in part for the following MEDICAL CONDITION: (primary reason for [place]) MEDICAL NECESSITY: I certify, that based on my findings, NURSING services are a medically necessary home health service. HOME BOUND STATUS: I certify that my clinical findings support that this patient is homebound (i.e., Due to illness or injury, pt requires aid of supportive devices such as crutches, cane, wheelchairs, walkers, the use of special transportation or the assistance of  another person to leave their place of residence. There is a normal inability to leave the home and doing so requires considerable and taxing effort. Other absences are for medical reasons / religious services and are infrequent or of short duration when for other reasons). If current dressing causes regression in wound condition, may D/C ordered dressing product/s and apply Normal Saline Moist Dressing daily until next [place] / Other MD appointment. [place] of regression in wound condition at [phone]. Please direct any NON-WOUND related issues/requests for orders to patient's Primary Care Physician Negative Pressure Wound Therapy: Wound #1 Right Gluteal fold: Wound VAC settings at 125/130 mmHg continuous pressure. Use BLACK/GREEN foam to wound cavity. Use WHITE foam to fill any tunnel/s and/or undermining. Change VAC dressing 3 X WEEK. Change canister as indicated when full. Nurse may titrate settings and frequency of dressing changes as clinically indicated. Home Health Nurse may d/c VAC for s/s of increased infection, significant wound regression, or uncontrolled drainage. [place] at [phone]. Apply contact layer over base of wound. - apply prisma/silver collagen to the base of the wound Number of foam/gauze pieces used in the dressing = Laboratory ordered were: CBC W Auto Differential panel - [facility] to draw CBC with diff, Sed Rate and CRP on Friday 06/10/19 and fax results to [facility] at [phone] 1. I would recommend that we continue with the wound  VAC he should be using white foam in the base of the wound and I did advise that we may want to have the [employer] rep reach out to the home health nurse in order to just advise of how this may be better managed as far as putting a white foam into allow the optimal healing. 2. I am also going to suggest that we continue to monitor the patient potentially using the silver nitrate as needed  in order to help with the hyper granulation as well I think that has been somewhat beneficial for him in the past. 3. I am also going to recommend that he continue with the Levaquin 500 mg which has been prescribed at this point I think that is doing a good job for him. 4. I did order a CBC with differential along with sed rate and C-reactive protein to be performed this Friday just to see where things are from a inflammatory marker standpoint. I am hoping that he is doing well but if not we will have a mark from which to further test in the future to see if we are headed in the right direction or worsening. We will see patient back for reevaluation in 1 week here in the clinic. If anything worsens or changes patient will contact our office for additional recommendations. Electronic Signature(s) Signed: 06/07/2019 5:31:01 PM By: Worthy Keeler [name] Entered By: Worthy Keeler on 06/07/2019 17:31:01 DAIRL, NICOL ([id number]) -------------------------------------------------------------------------------- ROS/PFSH Details Patient Name: [name] Date of Service: 06/07/2019 10:30 AM Medical Record Number: [id number] Patient Account Number: [id number] Date of Birth/Sex: 1951/06/17 (68 y.o. M) Treating RN: Montey Hora Primary Care Provider: [name] Other Clinician: [name] Referring Provider: [name] Treating Provider/Extender: [name], HOYT Weeks in Treatment: 47 Information Obtained From Patient Constitutional Symptoms (General Health) Complaints and Symptoms: Negative for: Fatigue; Fever; Chills; Marked Weight Change Respiratory Complaints and Symptoms: Negative for: Chronic or frequent coughs; Shortness of Breath Medical History: Negative for: Aspiration; Asthma; Chronic Obstructive Pulmonary Disease (COPD); Pneumothorax; Sleep Apnea; Tuberculosis Cardiovascular Complaints and Symptoms: Negative for: Chest pain; LE edema Medical  History: Positive for: Hypertension - takes medication Negative for: Angina; Arrhythmia; Congestive Heart Failure; Coronary Artery Disease; Deep Vein Thrombosis; Hypotension; Myocardial Infarction; Peripheral Arterial Disease; Peripheral Venous Disease; Phlebitis; Vasculitis Psychiatric Complaints and Symptoms: Negative for: Anxiety; Claustrophobia Medical History: Negative for: Anorexia/bulimia; Confinement Anxiety Eyes Medical History: Positive for: Cataracts - both removed Negative for: Glaucoma; Optic Neuritis Ear/Nose/Mouth/Throat Medical History: Negative for: Chronic sinus problems/congestion; Middle ear problems Hematologic/Lymphatic Medical History: Negative for: Anemia; Hemophilia; Human Immunodeficiency Virus; Lymphedema MANOLO, SEILER. ([id number]) Gastrointestinal Medical History: Negative for: Cirrhosis ; Colitis; Crohnos; Hepatitis A; Hepatitis B; Hepatitis C Endocrine Medical History: Negative for: Type I Diabetes; Type II Diabetes Genitourinary Medical History: Negative for: End Stage Renal Disease Immunological Medical History: Negative for: Lupus Erythematosus; Raynaudos; Scleroderma Integumentary (Skin) Medical History: Negative for: History of Burn; History of pressure wounds Musculoskeletal Medical History: Negative for: Gout; Rheumatoid Arthritis; Osteoarthritis; Osteomyelitis Neurologic Medical History: Positive for: Paraplegia - waist down Negative for: Dementia; Neuropathy; Quadriplegia; Seizure Disorder Oncologic Medical History: Negative for: Received Chemotherapy; Received Radiation Past Medical History Notes: Prostate cancer- currently treated with horomone therapy HBO Extended History Items Eyes: Cataracts Immunizations Pneumococcal Vaccine: Received Pneumococcal Vaccination: No Implantable Devices No devices added Family and Social History Cancer: No; Diabetes: No; Heart Disease: No; Hypertension: Yes - Father; Kidney  Disease: No; Lung Disease: No; Seizures: No; Stroke: Yes - Mother; Thyroid  Problems: No; Tuberculosis: No; Never smoker; Marital Status - Married; Alcohol Use: Never; Drug Use: No History; Caffeine Use: Daily CARLTON, MACER ([id number]) Physician Affirmation I have reviewed and agree with the above information. Electronic Signature(s) Signed: 06/07/2019 6:03:25 PM By: Worthy Keeler [name] Signed: 06/08/2019 4:46:03 PM By: Montey Hora Entered By: Worthy Keeler on 06/07/2019 17:29:30 ONAJE, ANGON ([id number]) -------------------------------------------------------------------------------- SuperBill Details Patient Name: [name] Date of Service: 06/07/2019 Medical Record Number: [id number] Patient Account Number: [id number] Date of Birth/Sex: 12/01/1950 (68 y.o. 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, sequela I10 Essential (primary) hypertension Facility Procedures CPT4 Code: [id number] Description: [id number] - CHEM CAUT GRANULATION TISS ICD-10 Diagnosis Description L89.314 Pressure ulcer of right buttock, stage 4 Modifier: Quantity: 1 Physician Procedures CPT4 [id number] Description: [id number] - WC PHYS LEVEL 4 - EST PT ICD-10 Diagnosis Description L89.314 Pressure ulcer of right buttock, stage 4 M86.68 Other chronic osteomyelitis, other site L03.317 Cellulitis of buttock G82.20 Paraplegia, unspecified Modifier: 25 Quantity: 1 CPT4 Code: [id number] Description: [id number] - WC PHYS CHEM CAUT GRAN TISSUE ICD-10 Diagnosis  Description L89.314 Pressure ulcer of right buttock, stage 4 Modifier: Quantity: 1 Electronic Signature(s) Signed: 06/07/2019 5:31:28 PM By: Worthy Keeler [name] Entered By: Worthy Keeler on 06/07/2019 17:31:28

## 2019-06-07 NOTE — Progress Notes (Signed)
[name], [name] ([id number]) Visit Report for 06/07/2019 Arrival Information Details Patient Name: [name], [name] Date of Service: 06/07/2019 8:00 AM Medical Record Number: [id number] Patient Account Number: [id number] Date of Birth/Sex: 1951/02/09 (68 y.o. M) Treating RN: Montey Hora Primary Care Ewin Rehberg: [name] Other Clinician: [name] Referring Bastion Bolger: [name] Treating Yochanan Eddleman/Extender: [name], HOYT Weeks in Treatment: 23 Visit Information History Since Last Visit Added or deleted any medications: No Patient Arrived: Wheel Chair Any new allergies or adverse reactions: No Arrival Time: 08:05 Had a fall or experienced change in No Accompanied By: wife activities of daily living that may affect Transfer Assistance: Harrel Lemon Lift risk of falls: Patient Identification Verified: Yes Signs or symptoms of abuse/neglect since last visito No Secondary Verification Process Completed: Yes Hospitalized since last visit: No Patient Requires Transmission-Based No Implantable device outside of the clinic excluding No Precautions: cellular tissue based products placed in the center Patient Has Alerts: Yes since last visit: Patient Alerts: NOT Has Dressing in Place as Prescribed: Yes Diabetic Pain Present Now: No Electronic Signature(s) Signed: 06/07/2019 10:31:23 AM By: Lorine Bears RCP, RRT, CHT Entered By: Lorine Bears on 06/07/2019 09:42:44 [name], [name] ([id number]) -------------------------------------------------------------------------------- Encounter Discharge Information Details Patient Name: [name] Date of Service: 06/07/2019 8:00 AM Medical Record Number: [id number] Patient Account Number: [id number] Date of Birth/Sex: January 25, 1951 (68 y.o. M) Treating RN: Montey Hora Primary Care Makani Seckman: [name] Other Clinician: [name] Referring Jaydalee Bardwell: [name] Treating Gorge Almanza/Extender: [name], HOYT Weeks in Treatment: 10 Encounter Discharge Information Items Discharge Condition: Stable Ambulatory Status: Wheelchair Discharge Destination: Home Transportation: Private Auto Accompanied By: wife Schedule Follow-up Appointment: Yes Clinical Summary of Care: Notes Patient has an HBO treatment scheduled on 06/08/2019 at 08:00 am. Electronic Signature(s) Signed: 06/07/2019 10:31:23 AM By: Lorine Bears RCP, RRT, CHT Entered By: Lorine Bears on 06/07/2019 10:30:37 [name], [name] ([id number]) -------------------------------------------------------------------------------- Vitals Details Patient Name: [name] Date of Service: 06/07/2019 8:00 AM Medical Record Number: [id number] Patient Account Number: [id number] Date of Birth/Sex: 02/04/1951 (68 y.o. M) Treating RN: Montey Hora Primary Care Hiroko Tregre: [name] Other Clinician: [name] Referring Ahna Konkle: [name] Treating Ladainian Therien/Extender: [name], HOYT Weeks in Treatment: 24 Vital Signs Time Taken: 08:18 Temperature (F): 98.6 Height (in): 73 Pulse (bpm): 72 Weight (lbs): 210 Respiratory Rate (breaths/min): 16 Body Mass Index (BMI): 27.7 Blood Pressure (mmHg): 116/68 Reference Range: 80 - 120 mg / dl Electronic Signature(s) Signed: 06/07/2019 10:31:23 AM By: Lorine Bears RCP, RRT, CHT Entered By: Lorine Bears on 06/07/2019 09:46:54

## 2019-06-08 ENCOUNTER — Encounter: Payer: Medicare Other | Admitting: Internal Medicine

## 2019-06-08 ENCOUNTER — Other Ambulatory Visit: Payer: Self-pay

## 2019-06-08 DIAGNOSIS — L89314 Pressure ulcer of right buttock, stage 4: Secondary | ICD-10-CM | POA: Diagnosis not present

## 2019-06-08 NOTE — Progress Notes (Signed)
NICHOLE, FYFFE ([id number]) Visit Report for 06/08/2019 Arrival Information Details Patient Name: AKIM, STEGER Date of Service: 06/08/2019 8:00 AM Medical Record Number: [id number] Patient Account Number: [id number] Date of Birth/Sex: 06/06/1951 (68 y.o. M) Treating RN: [name] Primary Care Kenadie Royce: [name] Other Clinician: [name] Referring Aleese Kamps: [name] Treating Jurnei Latini/Extender: [name] in Treatment: 45 Visit Information History Since Last Visit Added or deleted any medications: No Patient Arrived: Wheel Chair Any new allergies or adverse reactions: No Arrival Time: 08:00 Had a fall or experienced change in No Accompanied By: wife activities of daily living that may affect Transfer Assistance: Harrel Lemon Lift risk of falls: Patient Identification Verified: Yes Signs or symptoms of abuse/neglect since last visito No Secondary Verification Process Completed: Yes Hospitalized since last visit: No Patient Requires Transmission-Based No Implantable device outside of the clinic excluding No Precautions: cellular tissue based products placed in the center Patient Has Alerts: Yes since last visit: Patient Alerts: NOT Has Dressing in Place as Prescribed: Yes Diabetic Pain Present Now: No Electronic Signature(s) Signed: 06/08/2019 10:28:45 AM By: Lorine Bears RCP, RRT, CHT Entered By: Lorine Bears on 06/08/2019 08:55:35 MICHAE, FILIPEK ([id number]) -------------------------------------------------------------------------------- Encounter Discharge Information Details Patient Name: [name] Date of Service: 06/08/2019 8:00 AM Medical Record Number: [id number] Patient Account Number: [id number] Date of Birth/Sex: 15-Mar-1951 (68 y.o. M) Treating RN: [name] Primary Care Carolin Quang: [name] Other Clinician: [name] Referring Georjean Toya: [name] Treating Marrianne Sica/Extender: [name] in Treatment: 56 Encounter Discharge Information Items Discharge Condition: Stable Ambulatory Status: Wheelchair Discharge Destination: Home Transportation: Private Auto Accompanied By: daughter, Joelene Millin Schedule Follow-up Appointment: Yes Clinical Summary of Care: Notes Patient has an HBO treatment scheduled on 06/09/2019 at 08:00 am. Electronic Signature(s) Signed: 06/08/2019 10:28:45 AM By: Lorine Bears RCP, RRT, CHT Entered By: Lorine Bears on 06/08/2019 10:28:25 LAROME, SHADBOLT ([id number]) -------------------------------------------------------------------------------- Vitals Details Patient Name: [name] Date of Service: 06/08/2019 8:00 AM Medical Record Number: [id number] Patient Account Number: [id number] Date of Birth/Sex: 1951/06/13 (68 y.o. M) Treating RN: [name] Primary Care Romy Ipock: [name] Other Clinician: [name] Referring Jalien Weakland: [name] Treating Miaisabella Bacorn/Extender: [name] in Treatment: 4 Vital Signs Time Taken: 08:10 Temperature (F): 98.5 Height (in): 73 Pulse (bpm): 72 Weight (lbs): 210 Respiratory Rate (breaths/min): 16 Body Mass Index (BMI): 27.7 Blood Pressure (mmHg): 120/66 Reference Range: 80 - 120 mg / dl Electronic Signature(s) Signed: 06/08/2019 10:28:45 AM By: Lorine Bears RCP, RRT, CHT Entered By: Becky Sax, Amado Nash on 06/08/2019 08:56:04

## 2019-06-08 NOTE — Progress Notes (Signed)
[name], [name] ([id number]) Visit Report for 06/07/2019 HBO Details Patient Name: [name], [name] Date of Service: 06/07/2019 8:00 AM Medical Record Number: [id number] Patient Account Number: [id number] Date of Birth/Sex: Nov 22, 1950 (68 y.o. 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 HBO Treatment Details Treatment Number: 33 Patient Type: Outpatient Chamber Type: Monoplace Chamber Serial #: [id number] Treatment Protocol: 2.0 ATA with 90 minutes oxygen, and no air breaks Treatment Details Compression Rate Down: 1.5 psi / minute De-Compression Rate Up: 2.0 psi / minute Compress Tx Pressure Air breaks and breathing periods Decompress Decompress Begins Reached (leave unused spaces blank) Begins Ends Chamber Pressure (ATA) 1 2 - - - - - - 2 1 Clock Time (24 hr) 08:32 08:43 - - - - - - 10:13 10:21 Treatment Length: 109 (minutes) Treatment Segments: 4 Vital Signs Capillary Blood Glucose Reference Range: 80 - 120 mg / dl HBO Diabetic Blood Glucose Intervention Range: <131 mg/dl or >249 mg/dl Time Vitals Blood Respiratory Capillary Blood Glucose Pulse Action Type: Pulse: Temperature: Taken: Pressure: Rate: Glucose (mg/dl): Meter #: Oximetry (%) Taken: Pre 08:18 116/68 72 16 98.6 Post 10:28 112/66 72 16 98.1 Treatment Response Treatment Toleration: Well Treatment Completion Treatment Completed without Adverse Event Status: HBO Attestation I certify that I supervised this HBO treatment in accordance with Medicare guidelines. A trained emergency response Yes team is readily  available per hospital policies and procedures. Continue HBOT as ordered. Yes Electronic Signature(s) [name], [name] ([id number]) Signed: 06/07/2019 6:03:02 PM By: Worthy Keeler [name] Previous Signature: 06/07/2019 10:31:23 AM Version By: Lorine Bears RCP, RRT, CHT Entered By: Worthy Keeler on 06/07/2019 18:03:02 [name], [name] ([id number]) -------------------------------------------------------------------------------- HBO Safety Checklist Details Patient Name: [name] Date of Service: 06/07/2019 8:00 AM Medical Record Number: [id number] Patient Account Number: [id number] Date of Birth/Sex: [birth year] (68 y.o. M) Treating RN: Montey Hora Primary Care Mireille Lacombe: [name] Other Clinician: [name] Referring Alton Tremblay: [name] Treating Laquinta Hazell/Extender: [name], HOYT Weeks in Treatment: 17 HBO Safety Checklist Items Safety Checklist Consent Form Signed Patient voided / foley secured and emptied When did you last eato morning Last dose of injectable or oral agent n/a Ostomy pouch emptied and vented if applicable NA Medtronic Baclofen Pump; Nevro All implantable devices assessed, documented and approved Spinal Stimulator Intravenous access site secured and place NA Valuables secured Linens and cotton and cotton/polyester blend (less than 51% polyester) Personal oil-based products / skin lotions / body lotions removed Wigs or hairpieces removed NA Smoking or tobacco materials removed NA Books / newspapers / magazines / loose paper removed Cologne, aftershave, perfume and deodorant removed Jewelry removed (may wrap wedding band) Make-up removed NA Hair care products removed Battery operated devices (external) removed Heating patches and chemical warmers removed Titanium eyewear removed NA Nail polish cured greater than 10 hours NA Casting material cured greater than 10 hours NA Hearing aids  removed NA Loose dentures or partials removed NA Prosthetics have been removed NA Patient demonstrates correct use of air break device (if applicable) Patient concerns have been addressed Patient grounding bracelet on and cord attached to chamber Specifics for Inpatients (complete in addition to above) Medication sheet sent with patient Intravenous medications needed or due  during therapy sent with patient Drainage tubes (e.g. nasogastric tube or chest tube secured and vented) Endotracheal or Tracheotomy tube secured Cuff deflated of air and inflated with saline Airway suctioned Electronic Signature(s) [name], [name] ([id number]) Signed: 06/07/2019 10:31:23 AM By: Lorine Bears RCP, RRT, CHT Entered By: Lorine Bears on 06/07/2019 09:47:47

## 2019-06-08 NOTE — Progress Notes (Signed)
DIMITRI, SCHUUR ([id number]) Visit Report for 06/07/2019 Problem List Details Patient Name: [name], [name] Date of Service: 06/07/2019 8:00 AM Medical Record Number: [id number] Patient Account Number: [id number] Date of Birth/Sex: January 08, 1951 (68 y.o. 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, sequela I10  Essential (primary) hypertension Facility Procedures CPT4 Code: [id number] Description: (Facility Use Only) HBOT, full body chamber, 52min Modifier: Quantity: 4 Physician Procedures CPT4 Code: [id number] Description: [id number] - WC PHYS HYPERBARIC OXYGEN THERAPY ICD-10 Diagnosis Description M86.68 Other chronic osteomyelitis, other site L89.314 Pressure ulcer of right buttock, stage 4 L03.317 Cellulitis of buttock G82.20 Paraplegia, unspecified Modifier: Quantity: 1 Electronic Signature(s) Signed: 06/07/2019 6:03:07 PM By: Worthy Keeler [name] Previous Signature: 06/07/2019 10:31:23 AM Version By: Lorine Bears RCP, RRT, CHT Entered By: Worthy Keeler on 06/07/2019 18:03:06

## 2019-06-09 ENCOUNTER — Encounter: Payer: Medicare Other | Admitting: Physician Assistant

## 2019-06-09 DIAGNOSIS — L89314 Pressure ulcer of right buttock, stage 4: Secondary | ICD-10-CM | POA: Diagnosis not present

## 2019-06-09 NOTE — Progress Notes (Signed)
[name], [name] ([id number]) Visit Report for 06/09/2019 HBO Details Patient Name: [name], [name] Date of Service: 06/09/2019 8:00 AM Medical Record Number: [id number] Patient Account Number: [id number] Date of Birth/Sex: [birth year] (68 y.o. 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 HBO Treatment Details Treatment Number: 35 Patient Type: Outpatient Chamber Type: Monoplace Chamber Serial #: [id number] Treatment Protocol: 2.0 ATA with 90 minutes oxygen, and no air breaks Treatment Details Compression Rate Down: 1.5 psi / minute De-Compression Rate Up: 2.0 psi / minute Compress Tx Pressure Air breaks and breathing periods Decompress Decompress Begins Reached (leave unused spaces blank) Begins Ends Chamber Pressure (ATA) 1 2 - - - - - - 2 1 Clock Time (24 hr) 08:22 08:34 - - - - - - 10:04 10:12 Treatment Length: 110 (minutes) Treatment Segments: 4 Vital Signs Capillary Blood Glucose Reference Range: 80 - 120 mg / dl HBO Diabetic Blood Glucose Intervention Range: <131 mg/dl or >249 mg/dl Time Vitals Blood Respiratory Capillary Blood Glucose Pulse Action Type: Pulse: Temperature: Taken: Pressure: Rate: Glucose (mg/dl): Meter #: Oximetry (%) Taken: Pre 08:17 112/62 72 16 98.8 Post 10:16 120/70 66 16 98.1 Treatment Response Treatment Toleration: Well Treatment Completion Treatment Completed without Adverse Event Status: HBO Attestation I certify that I supervised this HBO treatment in accordance with Medicare guidelines. A trained emergency response Yes team is readily  available per hospital policies and procedures. Continue HBOT as ordered. Yes Electronic Signature(s) AZAYAH, BRAGG ([id number]) Signed: 06/09/2019 4:20:07 PM By: Worthy Keeler [name] Previous Signature: 06/09/2019 12:29:28 PM Version By: Lorine Bears RCP, RRT, CHT Entered By: Worthy Keeler on 06/09/2019 16:20:07 [name], [name] ([id number]) -------------------------------------------------------------------------------- HBO Safety Checklist Details Patient Name: [name] Date of Service: 06/09/2019 8:00 AM Medical Record Number: [id number] Patient Account Number: [id number] Date of Birth/Sex: 24-Feb-1951 (68 y.o. M) Treating RN: Army Melia Primary Care Shannon Balthazar: [name] Other Clinician: [name] Referring Lonie Rummell: [name] Treating Jasa Dundon/Extender: [name], HOYT Weeks in Treatment: 42 HBO Safety Checklist Items Safety Checklist Consent Form Signed Patient voided / foley secured and emptied When did you last eato morning Last dose of injectable or oral agent n/a Ostomy pouch emptied and vented if applicable NA Medtronic Baclofen Pump; Nevro All implantable devices assessed, documented and approved Spinal Stimulator Intravenous access site secured and place NA Valuables secured Linens and cotton and cotton/polyester blend (less than 51% polyester) Personal oil-based products / skin lotions / body lotions removed Wigs or hairpieces removed NA Smoking or tobacco materials removed NA Books / newspapers / magazines / loose paper removed Cologne, aftershave, perfume and deodorant removed Jewelry removed (may wrap wedding band) Make-up removed NA Hair care products removed Battery operated devices (external) removed NPWT pump Heating patches and chemical warmers removed Titanium eyewear removed NA Nail polish cured greater than 10 hours NA Casting material cured greater than 10 hours NA Hearing aids  removed NA Loose dentures or partials removed NA Prosthetics have been removed NA Patient demonstrates correct use of air break device (if applicable) Patient concerns have been addressed Patient grounding bracelet on and cord attached to chamber Specifics for Inpatients (complete in addition to above) Medication sheet sent with patient Intravenous medications needed  or due during therapy sent with patient Drainage tubes (e.g. nasogastric tube or chest tube secured and vented) Endotracheal or Tracheotomy tube secured Cuff deflated of air and inflated with saline Airway suctioned Electronic Signature(s) IZAIAH, SKATES ([id number]) Signed: 06/09/2019 12:29:28 PM By: Lorine Bears RCP, RRT, CHT Entered By: Lorine Bears on 06/09/2019 09:21:18

## 2019-06-09 NOTE — Progress Notes (Signed)
KILO, ZELTSER ([id number]) Visit Report for 06/09/2019 Arrival Information Details Patient Name: [name], [name] Date of Service: 06/09/2019 8:00 AM Medical Record Number: [id number] Patient Account Number: [id number] Date of Birth/Sex: 09/03/1950 (68 y.o. M) Treating RN: Army Melia Primary Care Josselin Gaulin: [name] Other Clinician: [name] Referring Bonny Vanleeuwen: [name] Treating Annica Marinello/Extender: [name], HOYT Weeks in Treatment: 79 Visit Information History Since Last Visit Added or deleted any medications: No Patient Arrived: Wheel Chair Any new allergies or adverse reactions: No Arrival Time: 09:15 Had a fall or experienced change in No Accompanied By: wife activities of daily living that may affect Transfer Assistance: Harrel Lemon Lift risk of falls: Patient Identification Verified: Yes Signs or symptoms of abuse/neglect since last visito No Secondary Verification Process Completed: Yes Hospitalized since last visit: No Patient Requires Transmission-Based No Implantable device outside of the clinic excluding No Precautions: cellular tissue based products placed in the center Patient Has Alerts: Yes since last visit: Patient Alerts: NOT Has Dressing in Place as Prescribed: Yes Diabetic Pain Present Now: No Electronic Signature(s) Signed: 06/09/2019 12:29:28 PM By: Lorine Bears RCP, RRT, CHT Entered By: Lorine Bears on 06/09/2019 09:16:46 [name] ([id number]) -------------------------------------------------------------------------------- Encounter Discharge Information Details Patient Name: [name] Date of Service: 06/09/2019 8:00 AM Medical Record Number: [id number] Patient Account Number: [id number] Date of Birth/Sex: [birth year] (68 y.o. M) Treating RN: Army Melia Primary Care Angelo Prindle: [name] Other Clinician: [name] Referring Denika Krone: [name] Treating Lanijah Warzecha/Extender: [name], HOYT Weeks in Treatment: 52 Encounter Discharge Information Items Discharge Condition: Stable Ambulatory Status: Wheelchair Discharge Destination: Home Transportation: Private Auto Accompanied By: wife Schedule Follow-up Appointment: Yes Clinical Summary of Care: Notes Patient has an HBO treatment scheduled on 06/10/2019 at 08:00 am. Electronic Signature(s) Signed: 06/09/2019 12:29:28 PM By: Lorine Bears RCP, RRT, CHT Entered By: Lorine Bears on 06/09/2019 10:31:26 JOEDY, DEAHL ([id number]) -------------------------------------------------------------------------------- Vitals Details Patient Name: [name] Date of Service: 06/09/2019 8:00 AM Medical Record Number: [id number] Patient Account Number: [id number] Date of Birth/Sex: April 23, 1951 (68 y.o. M) Treating RN: Army Melia Primary Care Hikari Tripp: [name] Other Clinician: [name] Referring Oday Ridings: [name] Treating Shardea Cwynar/Extender: [name], HOYT Weeks in Treatment: 85 Vital Signs Time Taken: 08:17 Temperature (F): 98.8 Height (in): 73 Pulse (bpm): 72 Weight (lbs): 210 Respiratory Rate (breaths/min): 16 Body Mass Index (BMI): 27.7 Blood Pressure (mmHg): 112/62 Reference Range: 80 - 120 mg / dl Electronic Signature(s) Signed: 06/09/2019 12:29:28 PM By: Lorine Bears RCP, RRT, CHT Entered By: Lorine Bears on 06/09/2019 09:17:35

## 2019-06-09 NOTE — Progress Notes (Signed)
Brett, [name] ([id number]) Visit Report for 06/08/2019 HBO Details Patient Name: [name], [name] Date of Service: 06/08/2019 8:00 AM Medical Record Number: [id number] Patient Account Number: [id number] Date of Birth/Sex: Oct 13, 1950 (68 y.o. 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 HBO Treatment Details Treatment Number: 34 Patient Type: Outpatient Chamber Type: Monoplace Chamber Serial #: [id number] Treatment Protocol: 2.0 ATA with 90 minutes oxygen, and no air breaks Treatment Details Compression Rate Down: 1.5 psi / minute De-Compression Rate Up: 1.5 psi / minute Compress Tx Pressure Air breaks and breathing periods Decompress Decompress Begins Reached (leave unused spaces blank) Begins Ends Chamber Pressure (ATA) 1 2 - - - - - - 2 1 Clock Time (24 hr) 08:15 08:27 - - - - - - 09:57 10:07 Treatment Length: 112 (minutes) Treatment Segments: 4 Vital Signs Capillary Blood Glucose Reference Range: 80 - 120 mg / dl HBO Diabetic Blood Glucose Intervention Range: <131 mg/dl or >249 mg/dl Time Vitals Blood Respiratory Capillary Blood Glucose Pulse Action Type: Pulse: Temperature: Taken: Pressure: Rate: Glucose (mg/dl): Meter #: Oximetry (%) Taken: Pre 08:10 120/66 72 16 98.5 Post 10:10 130/72 78 16 98.2 Treatment Response Treatment Completion Status: Treatment Completed without Adverse Event Elim Economou Notes No concerns with treatment given HBO Attestation I certify that I supervised this HBO treatment in accordance with Medicare guidelines. A trained emergency response  Yes team is readily available per hospital policies and procedures. Continue HBOT as ordered. Yes Electronic Signature(s) LEODEGARIO, WARSHAW ([id number]) Signed: 06/08/2019 5:09:09 PM By: Linton Ham MD Previous Signature: 06/08/2019 10:28:45 AM Version By: Lorine Bears RCP, RRT, CHT Entered By: Linton Ham on 06/08/2019 16:24:33 Brickell, [name] ([id number]) -------------------------------------------------------------------------------- HBO Safety Checklist Details Patient Name: [name] Date of Service: 06/08/2019 8:00 AM Medical Record Number: [id number] Patient Account Number: [id number] Date of Birth/Sex: [birth year] (68 y.o. M) Treating RN: [name] Primary Care Kieli Golladay: [name] Other Clinician: [name] Referring Seabron Iannello: [name] Treating Milarose Savich/Extender: [name] in Treatment: 52 HBO Safety Checklist Items Safety Checklist Consent Form Signed Patient voided / foley secured and emptied When did you last eato morning Last dose of injectable or oral agent n/a Ostomy pouch emptied and vented if applicable NA Medtronic Baclofen Pump; Nevro All implantable devices assessed, documented and approved Spinal Stimulator Intravenous access site secured and place NA Valuables secured Linens and cotton and cotton/polyester blend (less than 51% polyester) Personal oil-based products / skin lotions / body lotions removed Wigs or hairpieces removed NA Smoking or tobacco materials removed NA Books / newspapers / magazines / loose paper removed Cologne, aftershave, perfume and deodorant removed Jewelry removed (may wrap wedding band) Make-up removed NA Hair care products removed Battery operated devices (external) removed NPWT pump Heating patches and chemical warmers removed Titanium eyewear removed NA Nail polish cured greater than 10 hours NA Casting material cured greater than 10  hours NA Hearing aids removed NA Loose dentures or partials removed NA Prosthetics have been removed NA Patient demonstrates correct use of air break device (if applicable) Patient concerns have been addressed Patient grounding bracelet on and cord attached to chamber Specifics for Inpatients (complete in addition to above) Medication sheet sent with patient Intravenous  medications needed or due during therapy sent with patient Drainage tubes (e.g. nasogastric tube or chest tube secured and vented) Endotracheal or Tracheotomy tube secured Cuff deflated of air and inflated with saline Airway suctioned Electronic Signature(s) TILMAN, MEINEKE ([id number]) Signed: 06/08/2019 10:28:45 AM By: Lorine Bears RCP, RRT, CHT Entered By: Lorine Bears on 06/08/2019 08:59:43

## 2019-06-09 NOTE — Progress Notes (Signed)
Brett, [name] ([id number]) Visit Report for 06/07/2019 Arrival Information Details Patient Name: [name], [name] Date of Service: 06/07/2019 10:30 AM Medical Record Number: [id number] Patient Account Number: [id number] Date of Birth/Sex: Nov 26, 1950 (68 y.o. M) Treating RN: Montey Hora Primary Care Roland Prine: [name] Other Clinician: [name] Referring Aniel Hubble: [name] Treating Stewart Pimenta/Extender: [name], HOYT Weeks in Treatment: 63 Visit Information History Since Last Visit Added or deleted any medications: No Patient Arrived: Wheel Chair Any new allergies or adverse reactions: No Arrival Time: 10:31 Had a fall or experienced change in No Accompanied By: wife activities of daily living that may affect Transfer Assistance: Manual risk of falls: Patient Identification Verified: Yes Signs or symptoms of abuse/neglect since last visito No Secondary Verification Process Completed: Yes Hospitalized since last visit: No Patient Requires Transmission-Based No Implantable device outside of the clinic excluding No Precautions: cellular tissue based products placed in the center Patient Has Alerts: Yes since last visit: Patient Alerts: NOT Has Dressing in Place as Prescribed: Yes Diabetic Pain Present Now: No Electronic Signature(s) Signed: 06/07/2019 3:46:15 PM By: Lorine Bears RCP, RRT, CHT Entered By: Lorine Bears on 06/07/2019 10:33:03 LAMAN, RUDDEN ([id number]) -------------------------------------------------------------------------------- Encounter Discharge Information Details Patient Name: [name] Date of Service: 06/07/2019 10:30 AM Medical Record Number: [id number] Patient Account Number: [id number] Date of Birth/Sex: 09-20-1950 (68 y.o. M) Treating RN: Montey Hora Primary Care Jody Aguinaga: [name] Other Clinician: [name] Referring Racquelle Hyser: [name] Treating Truly Stankiewicz/Extender: [name], HOYT Weeks in Treatment: 40 Encounter Discharge Information Items Discharge Condition: Stable Ambulatory Status: Wheelchair Discharge Destination: Home Transportation: Private Auto Accompanied By: wife Schedule Follow-up Appointment: Yes Clinical Summary of Care: Electronic Signature(s) Signed: 06/07/2019 4:34:50 PM By: Montey Hora Entered By: Montey Hora on 06/07/2019 11:13:28 [name] ([id number]) -------------------------------------------------------------------------------- Lower Extremity Assessment Details Patient Name: [name] Date of Service: 06/07/2019 10:30 AM Medical Record Number: [id number] Patient Account Number: [id number] Date of Birth/Sex: [birth year] (68 y.o. M) Treating RN: [name] Primary Care Calise Dunckel: [name] Other Clinician: [name] Referring Jerrye Seebeck: [name] Treating Marko Skalski/Extender: Sharalyn Ink in Treatment: 29 Electronic Signature(s) Signed: 06/09/2019 1:15:06 PM By: Gretta Cool, BSN, RN, CWS, Kim RN, BSN Entered By: Gretta Cool, BSN, RN, CWS, Kim on 06/07/2019 10:45:40 ALQUAN, PEARY ([id number]) -------------------------------------------------------------------------------- Multi Wound Chart Details Patient Name: [name] Date of Service: 06/07/2019 10:30 AM Medical Record Number: [id number] Patient Account Number: [id number] Date of Birth/Sex: [birth year] (68 y.o. M) Treating RN: Montey Hora Primary Care Nithila Sumners: [name] Other Clinician: [name] Referring Brookelynne Dimperio: [name] Treating Chrysta Fulcher/Extender: [name], HOYT Weeks in Treatment: 38 Vital Signs Height(in): 73 Pulse(bpm): 72 Weight(lbs): 210 Blood Pressure(mmHg): 112/66 Body Mass Index(BMI): 28 Temperature(F): 98.1 Respiratory Rate 16 (breaths/min): Photos: [1:No Photos] [N/A:N/A] Wound Location: [1:Right Gluteal fold]  [N/A:N/A] Wounding Event: [1:Pressure Injury] [N/A:N/A] Primary Etiology: [1:Pressure Ulcer] [N/A:N/A] Comorbid History: [1:Cataracts, Hypertension, Paraplegia] [N/A:N/A] Date Acquired: [1:11/02/2017] [N/A:N/A] Weeks of Treatment: [1:79] [N/A:N/A] Wound Status: [1:Open] [N/A:N/A] Measurements L x W x D [1:3x2x2] [N/A:N/A] (cm) Area (cm) : [1:4.712] [N/A:N/A] Volume (cm) : [1:9.425] [N/A:N/A] % Reduction in Area: [1:53.10%] [N/A:N/A] % Reduction in Volume: [1:-837.80%] [N/A:N/A] Position 1 (o'clock): [1:11] Maximum Distance 1 (cm): [1:4] Tunneling: [1:Yes] [N/A:N/A] Classification: [1:Category/Stage IV] [N/A:N/A] Exudate Amount: [1:Large] [N/A:N/A] Exudate Type: [1:Serous] [N/A:N/A] Exudate Color: [1:amber] [N/A:N/A] Wound Margin: [1:Epibole] [N/A:N/A] Granulation Amount: [1:Large (67-100%)] [N/A:N/A] Granulation Quality: [1:Pink, Hyper-granulation] [N/A:N/A] Necrotic Amount: [1:Small (1-33%)] [N/A:N/A] Exposed Structures: [1:Fat Layer (Subcutaneous Tissue) Exposed: Yes Muscle: Yes  Fascia: No Tendon: No Joint: No Bone: No] [N/A:N/A] Epithelialization: [1:Small (1-33%)] [N/A:N/A] Assessment Notes: [1:Wound is macerated around the edges.] [N/A:N/A] Treatment Notes Electronic Signature(s) Signed: 06/07/2019 11:07:55 AM By: Montey Hora Entered By: Montey Hora on 06/07/2019 11:07:55 [name] ([id number]) -------------------------------------------------------------------------------- [place] Details Patient Name: [name] Date of Service: 06/07/2019 10:30 AM Medical Record Number: [id number] Patient Account Number: [id number] Date of Birth/Sex: [birth year] (68 y.o. M) Treating RN: Montey Hora Primary Care Iyahna Obriant: [name] Other Clinician: [name] Referring Shamirah Ivan: [name] Treating Frankey Botting/Extender: [name], HOYT Weeks in Treatment: 71 Active Inactive Orientation to the Wound Care  Program Nursing Diagnoses: Knowledge deficit related to the wound healing center program Goals: Patient/caregiver will verbalize understanding of the [facility] Program Date Initiated: 11/30/2017 Target Resolution Date: 12/21/2017 Goal Status: Active Interventions: Provide education on orientation to the wound center Notes: Pressure Nursing Diagnoses: Knowledge deficit related to management of pressures ulcers Goals: Patient/caregiver will verbalize understanding of pressure ulcer management Date Initiated: 05/17/2018 Target Resolution Date: 05/28/2018 Goal Status: Active Interventions: Assess: immobility, friction, shearing, incontinence upon admission and as needed Notes: Wound/Skin Impairment Nursing Diagnoses: Impaired tissue integrity Goals: Patient/caregiver will verbalize understanding of skin care regimen Date Initiated: 11/30/2017 Target Resolution Date: 12/21/2017 Goal Status: Active Ulcer/skin breakdown will have a volume reduction of 30% by week 4 Date Initiated: 11/30/2017 Target Resolution Date: 12/21/2017 Goal Status: Active KAILER, ZIPAY ([id number]) Interventions: Assess patient/caregiver ability to obtain necessary supplies Assess patient/caregiver ability to perform ulcer/skin care regimen upon admission and as needed Assess ulceration(s) every visit Treatment Activities: Skin care regimen initiated : 11/30/2017 Notes: Electronic Signature(s) Signed: 06/07/2019 11:07:47 AM By: Montey Hora Entered By: Montey Hora on 06/07/2019 11:07:47 [name] ([id number]) -------------------------------------------------------------------------------- Pain Assessment Details Patient Name: [name] Date of Service: 06/07/2019 10:30 AM Medical Record Number: [id number] Patient Account Number: [id number] Date of Birth/Sex: [birth year] (68 y.o. M) Treating RN: Montey Hora Primary Care Dreyah Montrose: [name] Other Clinician:  [name] Referring Janeann Paisley: [name] Treating Maleah Rabago/Extender: [name], HOYT Weeks in Treatment: 83 Active Problems Location of Pain Severity and Description of Pain Patient Has Paino No Site Locations Pain Management and Medication Current Pain Management: Electronic Signature(s) Signed: 06/07/2019 3:46:15 PM By: Paulla Fore, RRT, CHT Signed: 06/07/2019 4:34:50 PM By: Montey Hora Entered By: Lorine Bears on 06/07/2019 10:33:15 TARAJI, IANNUCCI ([id number]) -------------------------------------------------------------------------------- Patient/Caregiver Education Details Patient Name: [name] Date of Service: 06/07/2019 10:30 AM Medical Record Number: [id number] Patient Account Number: [id number] Date of Birth/Gender: 1951/02/10 (68 y.o. M) Treating RN: Montey Hora Primary Care Physician: [name] Other Clinician: [name] Referring Physician: [name] Treating Physician/Extender: Sharalyn Ink in Treatment: 18 Education Assessment Education Provided To: Patient and Caregiver Education Topics Provided Wound/Skin Impairment: Handouts: Other: wound care as ndeeded without NPWT Methods: Demonstration, Explain/Verbal Responses: State content correctly Electronic Signature(s) Signed: 06/07/2019 4:34:50 PM By: Montey Hora Entered By: Montey Hora on 06/07/2019 11:09:52 [name] ([id number]) -------------------------------------------------------------------------------- Wound Assessment Details Patient Name: [name] Date of Service: 06/07/2019 10:30 AM Medical Record Number: [id number] Patient Account Number: [id number] Date of Birth/Sex: [birth year] (68 y.o. M) Treating RN: [name] Primary Care Jaelani Posa: [name] Other Clinician: [name] Referring Miarose Lippert: [name] Treating Starlet Gallentine/Extender: [name],  HOYT Weeks in Treatment: 21 Wound Status Wound Number: 1 Primary Etiology: Pressure Ulcer Wound Location: Right Gluteal fold Wound Status: Open Wounding Event: Pressure Injury Comorbid History: Cataracts, Hypertension, Paraplegia Date  Acquired: 11/02/2017 Weeks Of Treatment: 79 Clustered Wound: No Photos Photo Uploaded By: Gretta Cool, BSN, RN, CWS, Kim on 06/07/2019 15:34:53 Wound Measurements Length: (cm) 3 Width: (cm) 2 Depth: (cm) 2 Area: (cm) 4.712 Volume: (cm) 9.425 % Reduction in Area: 53.1% % Reduction in Volume: -837.8% Epithelialization: Small (1-33%) Tunneling: Yes Position (o'clock): 11 Maximum Distance: (cm) 4 Undermining: No Wound Description Classification: Category/Stage IV Foul Odor Wound Margin: Epibole Slough/Fi Exudate Amount: Large Exudate Type: Serous Exudate Color: amber After Cleansing: No brino Yes Wound Bed Granulation Amount: Large (67-100%) Exposed Structure Granulation Quality: Pink, Hyper-granulation Fascia Exposed: No Necrotic Amount: Small (1-33%) Fat Layer (Subcutaneous Tissue) Exposed: Yes Necrotic Quality: Adherent Slough Tendon Exposed: No Muscle Exposed: Yes Necrosis of Muscle: No Bamford, Jordany J. ([id number]) Joint Exposed: No Bone Exposed: No Assessment Notes Wound is macerated around the edges. Treatment Notes Wound #1 (Right Gluteal fold) Notes zinc to peri wound, silvercel, xtrasorb and tegaderm Electronic Signature(s) Signed: 06/09/2019 1:15:06 PM By: Gretta Cool, BSN, RN, CWS, Kim RN, BSN Entered By: Gretta Cool, BSN, RN, CWS, Kim on 06/07/2019 10:45:29 ALDRIDGE, GLEDHILL ([id number]) -------------------------------------------------------------------------------- Vitals Details Patient Name: [name] Date of Service: 06/07/2019 10:30 AM Medical Record Number: [id number] Patient Account Number: [id number] Date of Birth/Sex: [birth year] (68 y.o. M) Treating RN: Montey Hora Primary Care Zyron Deeley: [name] Other Clinician: [name] Referring Birdella Sippel: [name] Treating Demarques Pilz/Extender: [name], HOYT Weeks in Treatment: 20 Vital Signs Time Taken: 10:31 Temperature (F): 98.1 Height (in): 73 Pulse (bpm): 72 Weight (lbs): 210 Respiratory Rate (breaths/min): 16 Body Mass Index (BMI): 27.7 Blood Pressure (mmHg): 112/66 Reference Range: 80 - 120 mg / dl Electronic Signature(s) Signed: 06/07/2019 3:46:15 PM By: Lorine Bears RCP, RRT, CHT Entered By: Lorine Bears on 06/07/2019 10:35:24

## 2019-06-09 NOTE — Progress Notes (Signed)
RANDON, MUSSON ([id number]) Visit Report for 06/09/2019 Problem List Details Patient Name: [name], [name] Date of Service: 06/09/2019 8:00 AM Medical Record Number: [id number] Patient Account Number: [id number] Date of Birth/Sex: [birth year] (68 y.o. 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, sequela I10  Essential (primary) hypertension Facility Procedures CPT4 Code: [id number] Description: (Facility Use Only) HBOT, full body chamber, 49min Modifier: Quantity: 4 Physician Procedures CPT4 Code: [id number] Description: [id number] - WC PHYS HYPERBARIC OXYGEN THERAPY ICD-10 Diagnosis Description M86.68 Other chronic osteomyelitis, other site L89.314 Pressure ulcer of right buttock, stage 4 L03.317 Cellulitis of buttock G82.20 Paraplegia, unspecified Modifier: Quantity: 1 Electronic Signature(s) Signed: 06/09/2019 4:20:13 PM By: Worthy Keeler [name] Previous Signature: 06/09/2019 12:29:28 PM Version By: Lorine Bears RCP, RRT, CHT Entered By: Worthy Keeler on 06/09/2019 16:20:12

## 2019-06-10 ENCOUNTER — Encounter: Payer: Medicare Other | Admitting: Physician Assistant

## 2019-06-10 ENCOUNTER — Other Ambulatory Visit: Payer: Self-pay

## 2019-06-10 DIAGNOSIS — L89314 Pressure ulcer of right buttock, stage 4: Secondary | ICD-10-CM | POA: Diagnosis not present

## 2019-06-10 NOTE — Progress Notes (Addendum)
TAYE, MULLOY ([id number]) Visit Report for 06/10/2019 HBO Details Patient Name: [name], [name] Date of Service: 06/10/2019 8:00 AM Medical Record Number: [id number] Patient Account Number: [id number] Date of Birth/Sex: [birth year] (68 y.o. 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 HBO Treatment Details Treatment Number: 36 Patient Type: Outpatient Chamber Type: Monoplace Chamber Serial #: [id number] Treatment Protocol: 2.0 ATA with 90 minutes oxygen, and no air breaks Treatment Details Compression Rate Down: 1.5 psi / minute De-Compression Rate Up: 1.5 psi / minute Compress Tx Pressure Air breaks and breathing periods Decompress Decompress Begins Reached (leave unused spaces blank) Begins Ends Chamber Pressure (ATA) 1 2 - - - - - - 2 1 Clock Time (24 hr) 08:29 08:40 - - - - - - 10:12 10:25 Treatment Length: 116 (minutes) Treatment Segments: 4 Vital Signs Capillary Blood Glucose Reference Range: 80 - 120 mg / dl HBO Diabetic Blood Glucose Intervention Range: <131 mg/dl or >249 mg/dl Time Vitals Blood Respiratory Capillary Blood Glucose Pulse Action Type: Pulse: Temperature: Taken: Pressure: Rate: Glucose (mg/dl): Meter #: Oximetry (%) Taken: Pre 08:14 140/78 68 16 99.2 Post 10:28 138/78 68 16 98.2 Treatment Response Treatment Toleration: Well Treatment Completion Treatment Completed without Adverse Event Status: HBO Attestation I certify that I supervised this HBO treatment in accordance with Medicare guidelines. A trained emergency response Yes team is readily available  per hospital policies and procedures. Continue HBOT as ordered. Yes Electronic Signature(s) QUINTO, MAHLMAN ([id number]) Signed: 06/10/2019 1:52:09 PM By: Worthy Keeler [name] Previous Signature: 06/10/2019 10:30:12 AM Version By: Gretta Cool BSN, RN, CWS, Kim RN, BSN Previous Signature: 06/10/2019 10:13:16 AM Version By: Gretta Cool, BSN, RN, CWS, Kim RN, BSN Previous Signature: 06/10/2019 8:43:18 AM Version By: Gretta Cool, BSN, RN, CWS, Kim RN, BSN Entered By: Worthy Keeler on 06/10/2019 13:52:09 BRITAN, EIGNER ([id number]) -------------------------------------------------------------------------------- HBO Safety Checklist Details Patient Name: [name] Date of Service: 06/10/2019 8:00 AM Medical Record Number: [id number] Patient Account Number: [id number] Date of Birth/Sex: 1950/12/07 (68 y.o. M) Treating RN: [name] Primary Care Denyla Cortese: [name] Other Clinician: [name] Referring Raymir Frommelt: [name] Treating Jarquavious Fentress/Extender: [name], HOYT Weeks in Treatment: 16 HBO Safety Checklist Items Safety Checklist Consent Form Signed Patient voided / foley secured and emptied When did you last eato am Last dose of injectable or oral agent NA Ostomy pouch emptied and vented if applicable NA All implantable devices assessed, documented and approved NA Intravenous access site secured and place NA Valuables secured Linens and cotton and cotton/polyester blend (less than 51% polyester) Personal oil-based products / skin lotions / body lotions removed Wigs or hairpieces removed NA Smoking or tobacco materials removed Books / newspapers / magazines / loose paper removed Cologne, aftershave, perfume and deodorant removed Jewelry removed (may wrap wedding band) Make-up removed NA Hair care products removed Battery operated devices (external) removed NA Heating patches and chemical warmers removed Titanium eyewear removed NA Nail polish cured  greater than 10 hours NA Casting material cured greater than 10 hours NA Hearing aids removed NA Loose dentures or partials removed NA Prosthetics have been removed NA Patient demonstrates correct use of air break device (if applicable) Patient concerns have been addressed Patient grounding bracelet  on and cord attached to chamber Specifics for Inpatients (complete in addition to above) Medication sheet sent with patient Intravenous medications needed or due during therapy sent with patient Drainage tubes (e.g. nasogastric tube or chest tube secured and vented) Endotracheal or Tracheotomy tube secured Cuff deflated of air and inflated with saline Airway suctioned Electronic Signature(s) Signed: 06/10/2019 8:32:36 AM By: Gretta Cool, BSN, RN, CWS, Kim RN, BSN [address] [place] ([id number]) Entered By: Gretta Cool, BSN, RN, CWS, Kim on 06/10/2019 08:32:36

## 2019-06-10 NOTE — Progress Notes (Signed)
RUSH, BRUST ([id number]) Visit Report for 06/10/2019 Problem List Details Patient Name: [name], [name] Date of Service: 06/10/2019 8:00 AM Medical Record Number: [id number] Patient Account Number: [id number] Date of Birth/Sex: March 26, 1951 (68 y.o. 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, sequela I10 Essential  (primary) hypertension Facility Procedures CPT4 Code: [id number] Description: (Facility Use Only) HBOT, full body chamber, 87min ICD-10 Diagnosis Description M86.68 Other chronic osteomyelitis, other site Modifier: Quantity: 4 Physician Procedures CPT4 Code: [id number] Description: [id number] - WC PHYS HYPERBARIC OXYGEN THERAPY ICD-10 Diagnosis Description M86.68 Other chronic osteomyelitis, other site Modifier: Quantity: 1 Electronic Signature(s) Signed: 06/10/2019 1:52:16 PM By: Worthy Keeler [name] Previous Signature: 06/10/2019 10:30:44 AM Version By: Gretta Cool, BSN, RN, CWS, Kim RN, BSN Entered By: Worthy Keeler on 06/10/2019 13:52:15

## 2019-06-10 NOTE — Progress Notes (Addendum)
ARPAD, HALDER ([id number]) Visit Report for 06/10/2019 Arrival Information Details Patient Name: [name], [name] Date of Service: 06/10/2019 8:00 AM Medical Record Number: [id number] Patient Account Number: [id number] Date of Birth/Sex: Aug 08, 1950 (68 y.o. M) Treating RN: [name] Primary Care Daily Doe: [name] Other Clinician: [name] Referring Avonne Berkery: [name] Treating Jerzy Crotteau/Extender: Worthy Keeler Weeks in Treatment: 76 Visit Information History Since Last Visit Added or deleted any medications: No Patient Arrived: Wheel Chair Any new allergies or adverse reactions: No Arrival Time: 08:14 Had a fall or experienced change in No Accompanied By: wife activities of daily living that may affect Transfer Assistance: Harrel Lemon Lift risk of falls: Patient Identification Verified: Yes Signs or symptoms of abuse/neglect since last visito No Secondary Verification Process Completed: Yes Hospitalized since last visit: No Patient Requires Transmission-Based No Implantable device outside of the clinic excluding No Precautions: cellular tissue based products placed in the center Patient Has Alerts: Yes since last visit: Patient Alerts: NOT Has Dressing in Place as Prescribed: Yes Diabetic Pain Present Now: No Electronic Signature(s) Signed: 06/10/2019 8:14:33 AM By: Gretta Cool, BSN, RN, CWS, Kim RN, BSN Entered By: Gretta Cool, BSN, RN, CWS, Kim on 06/10/2019 08:14:33 CHUNG, SUAREZ ([id number]) -------------------------------------------------------------------------------- Encounter Discharge Information Details Patient Name: [name] Date of Service: 06/10/2019 8:00 AM Medical Record Number: [id number] Patient Account Number: [id number] Date of Birth/Sex: [birth year] (68 y.o. M) Treating RN: [name] Primary Care Tyrease Vandeberg: [name] Other Clinician: [name] Referring Khaleef Ruby: [name] Treating Berneice Zettlemoyer/Extender:  [name], HOYT Weeks in Treatment: 31 Encounter Discharge Information Items Discharge Condition: Stable Ambulatory Status: Wheelchair Discharge Destination: Home Transportation: Private Auto Accompanied By: wife Schedule Follow-up Appointment: Yes Clinical Summary of Care: Electronic Signature(s) Signed: 06/10/2019 10:31:14 AM By: Gretta Cool, BSN, RN, CWS, Kim RN, BSN Entered By: Gretta Cool, BSN, RN, CWS, Kim on 06/10/2019 10:31:14 BLUFORD, KUBES ([id number]) -------------------------------------------------------------------------------- Vitals Details Patient Name: [name] Date of Service: 06/10/2019 8:00 AM Medical Record Number: [id number] Patient Account Number: [id number] Date of Birth/Sex: Sep 21, 1950 (68 y.o. M) Treating RN: [name] Primary Care Aneudy Champlain: [name] Other Clinician: [name] Referring Syesha Thaw: [name] Treating Chailyn Racette/Extender: [name], HOYT Weeks in Treatment: 65 Vital Signs Time Taken: 08:14 Temperature (F): 99.2 Height (in): 73 Pulse (bpm): 68 Weight (lbs): 210 Respiratory Rate (breaths/min): 16 Body Mass Index (BMI): 27.7 Blood Pressure (mmHg): 140/78 Reference Range: 80 - 120 mg / dl Electronic Signature(s) Signed: 06/10/2019 8:31:24 AM By: Gretta Cool, BSN, RN, CWS, Kim RN, BSN Entered By: Gretta Cool, BSN, RN, CWS, Kim on 06/10/2019 08:31:23

## 2019-06-13 ENCOUNTER — Encounter: Payer: Medicare Other | Admitting: Physician Assistant

## 2019-06-13 ENCOUNTER — Other Ambulatory Visit: Payer: Self-pay

## 2019-06-13 DIAGNOSIS — L89314 Pressure ulcer of right buttock, stage 4: Secondary | ICD-10-CM | POA: Diagnosis not present

## 2019-06-14 ENCOUNTER — Ambulatory Visit: Payer: Medicare Other | Admitting: Physician Assistant

## 2019-06-14 ENCOUNTER — Encounter: Payer: Medicare Other | Admitting: Physician Assistant

## 2019-06-14 DIAGNOSIS — L89314 Pressure ulcer of right buttock, stage 4: Secondary | ICD-10-CM | POA: Diagnosis not present

## 2019-06-14 NOTE — Progress Notes (Signed)
CALIXTO, VASILOPOULOS ([id number]) Visit Report for 06/14/2019 Arrival Information Details Patient Name: ROLDAN, COXEY Date of Service: 06/14/2019 1:30 PM Medical Record Number: [id number] Patient Account Number: [id number] Date of Birth/Sex: 25-Nov-1950 (68 y.o. M) Treating RN: Montey Hora Primary Care Ronda Kazmi: [name] Other Clinician: [name] Referring Keerat Denicola: [name] Treating Mayvis Agudelo/Extender: [name], HOYT Weeks in Treatment: 80 Visit Information History Since Last Visit Added or deleted any medications: No Patient Arrived: Wheel Chair Any new allergies or adverse reactions: No Arrival Time: 13:58 Had a fall or experienced change in No Accompanied By: wife activities of daily living that may affect Transfer Assistance: Harrel Lemon Lift risk of falls: Patient Identification Verified: Yes Signs or symptoms of abuse/neglect since last visito No Secondary Verification Process Completed: Yes Hospitalized since last visit: No Patient Requires Transmission-Based No Implantable device outside of the clinic excluding No Precautions: cellular tissue based products placed in the center Patient Has Alerts: Yes since last visit: Patient Alerts: NOT Pain Present Now: No Diabetic Electronic Signature(s) Signed: 06/14/2019 4:19:47 PM By: Lorine Bears RCP, RRT, CHT Entered By: Lorine Bears on 06/14/2019 14:04:27 KEOKI, LANOUE ([id number]) -------------------------------------------------------------------------------- Vitals Details Patient Name: [name] Date of Service: 06/14/2019 1:30 PM Medical Record Number: [id number] Patient Account Number: [id number] Date of Birth/Sex: [birth year] (68 y.o. M) Treating RN: Montey Hora Primary Care Leilani Cespedes: [name] Other Clinician: [name] Referring Moraima Burd: [name] Treating Naesha Buckalew/Extender: [name], HOYT Weeks in Treatment:  80 Vital Signs Time Taken: 13:00 Temperature (F): 98.8 Height (in): 73 Pulse (bpm): 78 Weight (lbs): 210 Respiratory Rate (breaths/min): 16 Body Mass Index (BMI): 27.7 Blood Pressure (mmHg): 104/64 Reference Range: 80 - 120 mg / dl Electronic Signature(s) Signed: 06/14/2019 4:19:47 PM By: Lorine Bears RCP, RRT, CHT Entered By: Lorine Bears on 06/14/2019 14:04:56

## 2019-06-15 ENCOUNTER — Other Ambulatory Visit: Payer: Self-pay

## 2019-06-15 ENCOUNTER — Encounter: Payer: Medicare Other | Admitting: Physician Assistant

## 2019-06-15 DIAGNOSIS — L89314 Pressure ulcer of right buttock, stage 4: Secondary | ICD-10-CM | POA: Diagnosis not present

## 2019-06-15 NOTE — Progress Notes (Signed)
[name], [name] ([id number]) Visit Report for 06/14/2019 Problem List Details Patient Name: [name], [name] Date of Service: 06/14/2019 1:30 PM Medical Record Number: [id number] Patient Account Number: [id number] Date of Birth/Sex: February 20, 1951 (68 y.o. 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, sequela I10  Essential (primary) hypertension Facility Procedures CPT4 Code: [id number] Description: (Facility Use Only) HBOT, full body chamber, 42min Modifier: Quantity: 4 Physician Procedures CPT4 Code: [id number] Description: [id number] - WC PHYS HYPERBARIC OXYGEN THERAPY ICD-10 Diagnosis Description M86.68 Other chronic osteomyelitis, other site L89.314 Pressure ulcer of right buttock, stage 4 L03.317 Cellulitis of buttock G82.20 Paraplegia, unspecified Modifier: Quantity: 1 Electronic Signature(s) Signed: 06/14/2019 6:12:02 PM By: Worthy Keeler [name] Previous Signature: 06/14/2019 4:19:47 PM Version By: Lorine Bears RCP, RRT, CHT Entered By: Worthy Keeler on 06/14/2019 18:11:37

## 2019-06-15 NOTE — Progress Notes (Signed)
Brett, [name] ([id number]) Visit Report for 06/15/2019 Arrival Information Details Patient Name: Brett, [name] Date of Service: 06/15/2019 12:45 PM Medical Record Number: [id number] Patient Account Number: [id number] Date of Birth/Sex: 1951-03-07 (68 y.o. M) Treating RN: [name] Primary Care Shayra Anton: [name] Other Clinician: [name] Referring Norah Fick: [name] Treating Khalib Fendley/Extender: [name], HOYT Weeks in Treatment: 80 Visit Information History Since Last Visit Added or deleted any medications: No Patient Arrived: Wheel Chair Any new allergies or adverse reactions: No Arrival Time: 13:15 Had a fall or experienced change in No Accompanied By: wife activities of daily living that may affect Transfer Assistance: Harrel Lemon Lift risk of falls: Patient Identification Verified: Yes Signs or symptoms of abuse/neglect since last visito No Secondary Verification Process Completed: Yes Hospitalized since last visit: No Patient Requires Transmission-Based No Implantable device outside of the clinic excluding No Precautions: cellular tissue based products placed in the center Patient Has Alerts: Yes since last visit: Patient Alerts: NOT Has Dressing in Place as Prescribed: Yes Diabetic Pain Present Now: No Electronic Signature(s) Signed: 06/15/2019 4:18:38 PM By: Lorine Bears RCP, RRT, CHT Entered By: Lorine Bears on 06/15/2019 13:17:08 Brett, [name] ([id number]) -------------------------------------------------------------------------------- Encounter Discharge Information Details Patient Name: [name] Date of Service: 06/15/2019 12:45 PM Medical Record Number: [id number] Patient Account Number: [id number] Date of Birth/Sex: 1950-11-24 (68 y.o. M) Treating RN: [name] Primary Care Everleigh Colclasure: [name] Other Clinician: [name] Referring Naiyah Klostermann: [name] Treating Annalynn Centanni/Extender: [name], HOYT Weeks in Treatment: 55 Encounter Discharge Information Items Discharge Condition: Stable Ambulatory Status: Wheelchair Discharge Destination: Home Transportation: Private Auto Accompanied By: wife Schedule Follow-up Appointment: Yes Clinical Summary of Care: Notes Patient has an HBO treatment scheduled on 06/20/2019 at 08:00 am. Electronic Signature(s) Signed: 06/15/2019 4:18:38 PM By: Lorine Bears RCP, RRT, CHT Entered By: Lorine Bears on 06/15/2019 15:32:39 Brett, [name] ([id number]) -------------------------------------------------------------------------------- Vitals Details Patient Name: [name] Date of Service: 06/15/2019 12:45 PM Medical Record Number: [id number] Patient Account Number: [id number] Date of Birth/Sex: [birth year] (68 y.o. M) Treating RN: [name] Primary Care Ryen Rhames: [name] Other Clinician: [name] Referring Tim Corriher: [name] Treating Daysean Tinkham/Extender: [name], HOYT Weeks in Treatment: 80 Vital Signs Time Taken: 12:56 Temperature (F): 98.7 Height (in): 73 Pulse (bpm): 72 Weight (lbs): 210 Respiratory Rate (breaths/min): 16 Body Mass Index (BMI): 27.7 Blood Pressure (mmHg): 102/60 Reference Range: 80 - 120 mg / dl Electronic Signature(s) Signed: 06/15/2019 4:18:38 PM By: Lorine Bears RCP, RRT, CHT Entered By: Lorine Bears on 06/15/2019 13:17:49

## 2019-06-15 NOTE — Progress Notes (Signed)
[name], [name] ([id number]) Visit Report for 06/14/2019 HBO Details Patient Name: [name], [name] Date of Service: 06/14/2019 1:30 PM Medical Record Number: [id number] Patient Account Number: [id number] Date of Birth/Sex: [birth year] (68 y.o. 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 HBO Treatment Details Treatment Number: 38 Patient Type: Outpatient Chamber Type: Monoplace Chamber Serial #: [id number] Treatment Protocol: 2.0 ATA with 90 minutes oxygen, and no air breaks Treatment Details Compression Rate Down: 1.5 psi / minute De-Compression Rate Up: 1.5 psi / minute Compress Tx Pressure Air breaks and breathing periods Decompress Decompress Begins Reached (leave unused spaces blank) Begins Ends Chamber Pressure (ATA) 1 2 - - - - - - 2 1 Clock Time (24 hr) 13:08 13:20 - - - - - - 14:50 15:00 Treatment Length: 112 (minutes) Treatment Segments: 4 Vital Signs Capillary Blood Glucose Reference Range: 80 - 120 mg / dl HBO Diabetic Blood Glucose Intervention Range: <131 mg/dl or >249 mg/dl Time Vitals Blood Respiratory Capillary Blood Glucose Pulse Action Type: Pulse: Temperature: Taken: Pressure: Rate: Glucose (mg/dl): Meter #: Oximetry (%) Taken: Pre 13:00 104/64 78 16 98.8 Post 15:05 110/66 66 16 97.8 Treatment Response Treatment Toleration: Well Treatment Completion Treatment Completed without Adverse Event Status: HBO Attestation I certify that I supervised this HBO treatment in accordance with Medicare guidelines. A trained emergency response Yes team is readily  available per hospital policies and procedures. Continue HBOT as ordered. Yes Electronic Signature(s) [name], [name] ([id number]) Signed: 06/14/2019 6:12:02 PM By: Worthy Keeler [name] Previous Signature: 06/14/2019 4:19:47 PM Version By: Lorine Bears RCP, RRT, CHT Entered By: Worthy Keeler on 06/14/2019 18:11:32 [name], [name] ([id number]) -------------------------------------------------------------------------------- HBO Safety Checklist Details Patient Name: [name] Date of Service: 06/14/2019 1:30 PM Medical Record Number: [id number] Patient Account Number: [id number] Date of Birth/Sex: 06-04-1951 (68 y.o. M) Treating RN: Montey Hora Primary Care Koren Sermersheim: [name] Other Clinician: [name] Referring Jameca Chumley: [name] Treating Leif Loflin/Extender: [name], HOYT Weeks in Treatment: 80 HBO Safety Checklist Items Safety Checklist Consent Form Signed Patient voided / foley secured and emptied When did you last eato morning Last dose of injectable or oral agent n/a Ostomy pouch emptied and vented if applicable NA Medtronic Baclofen Pump; Nevro All implantable devices assessed, documented and approved Spinal Stimulator Intravenous access site secured and place NA Valuables secured Linens and cotton and cotton/polyester blend (less than 51% polyester) Personal oil-based products / skin lotions / body lotions removed Wigs or hairpieces removed NA Smoking or tobacco materials removed NA Books / newspapers / magazines / loose paper removed Cologne, aftershave, perfume and deodorant removed Jewelry removed (may wrap wedding band) Make-up removed NA Hair care products removed Battery operated devices (external) removed NPWT pump Heating patches and chemical warmers removed Titanium eyewear removed NA Nail polish cured greater than 10 hours NA Casting material cured greater than 10 hours NA Hearing aids  removed NA Loose dentures or partials removed NA Prosthetics have been removed NA Patient demonstrates correct use of air break device (if applicable) Patient concerns have been addressed Patient grounding bracelet on and cord attached to chamber Specifics for Inpatients (complete in addition to above) Medication sheet sent with patient Intravenous medications needed  or due during therapy sent with patient Drainage tubes (e.g. nasogastric tube or chest tube secured and vented) Endotracheal or Tracheotomy tube secured Cuff deflated of air and inflated with saline Airway suctioned Electronic Signature(s) [name], [name] ([id number]) Signed: 06/14/2019 4:19:47 PM By: Lorine Bears RCP, RRT, CHT Entered By: Lorine Bears on 06/14/2019 14:06:14

## 2019-06-15 NOTE — Progress Notes (Signed)
BABU, GOHN ([id number]) Visit Report for 06/15/2019 Problem List Details Patient Name: FAYSAL, BOURRET Date of Service: 06/15/2019 12:45 PM Medical Record Number: [id number] Patient Account Number: [id number] Date of Birth/Sex: 02-05-1951 (68 y.o. 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, sequela I10 Essential  (primary) hypertension Facility Procedures CPT4 Code: [id number] Description: (Facility Use Only) HBOT, full body chamber, 12min Modifier: Quantity: 4 Physician Procedures CPT4 Code: [id number] Description: [id number] - WC PHYS HYPERBARIC OXYGEN THERAPY ICD-10 Diagnosis Description M86.68 Other chronic osteomyelitis, other site L89.314 Pressure ulcer of right buttock, stage 4 G82.20 Paraplegia, unspecified S34.109S Unspecified injury to unspecified  level of lumbar spinal cord, Modifier: sequela Quantity: 1 Electronic Signature(s) Signed: 06/15/2019 4:47:05 PM By: Worthy Keeler [name] Entered By: Worthy Keeler on 06/15/2019 16:01:34

## 2019-06-15 NOTE — Progress Notes (Signed)
ESTEPHAN, LAPIETRA ([id number]) Visit Report for 06/15/2019 HBO Details Patient Name: [name], [name] Date of Service: 06/15/2019 12:45 PM Medical Record Number: [id number] Patient Account Number: [id number] Date of Birth/Sex: 1951-06-08 (68 y.o. 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 HBO Treatment Details Treatment Number: 39 Patient Type: Outpatient Chamber Type: Monoplace Chamber Serial #: [id number] Treatment Protocol: 2.0 ATA with 90 minutes oxygen, and no air breaks Treatment Details Compression Rate Down: 1.5 psi / minute De-Compression Rate Up: Compress Tx Pressure Air breaks and breathing periods Decompress Decompress Begins Reached (leave unused spaces blank) Begins Ends Chamber Pressure (ATA) 1 2 - - - - - - 2 1 Clock Time (24 hr) 13:00 13:12 - - - - - - 14:42 14:52 Treatment Length: 112 (minutes) Treatment Segments: 4 Vital Signs Capillary Blood Glucose Reference Range: 80 - 120 mg / dl HBO Diabetic Blood Glucose Intervention Range: <131 mg/dl or >249 mg/dl Time Vitals Blood Respiratory Capillary Blood Glucose Pulse Action Type: Pulse: Temperature: Taken: Pressure: Rate: Glucose (mg/dl): Meter #: Oximetry (%) Taken: Pre 12:56 102/60 72 16 98.7 Post 14:55 120/82 60 16 97.9 Treatment Response Treatment Toleration: Well Treatment Completion Treatment Completed without Adverse Event Status: HBO Attestation I certify that I supervised this HBO treatment in accordance with Medicare guidelines. A trained emergency response Yes team is readily available per hospital  policies and procedures. Continue HBOT as ordered. Yes Electronic Signature(s) NYQUAN, PURSIFULL ([id number]) Signed: 06/15/2019 4:47:05 PM By: Worthy Keeler [name] Entered By: Worthy Keeler on 06/15/2019 16:01:28 WENDEL, DECANDIA ([id number]) -------------------------------------------------------------------------------- HBO Safety Checklist Details Patient Name: [name] Date of Service: 06/15/2019 12:45 PM Medical Record Number: [id number] Patient Account Number: [id number] Date of Birth/Sex: 1950/09/23 (68 y.o. M) Treating RN: [name] Primary Care [place]: [name] Other Clinician: [name] Referring Lakenzie Mcclafferty: [name] Treating Edger Husain/Extender: [name], HOYT Weeks in Treatment: 80 HBO Safety Checklist Items Safety Checklist Consent Form Signed Patient voided / foley secured and emptied When did you last eato lunch Last dose of injectable or oral agent n/a Ostomy pouch emptied and vented if applicable NA Medtronic Baclofen Pump; Nevro All implantable devices assessed, documented and approved Spinal Stimulator Intravenous access site secured and place NA Valuables secured Linens and cotton and cotton/polyester blend (less than 51% polyester) Personal oil-based products / skin lotions / body lotions removed Wigs or hairpieces removed NA Smoking or tobacco materials removed NA Books / newspapers / magazines / loose paper removed Cologne, aftershave, perfume and deodorant removed Jewelry removed (may wrap wedding band) Make-up removed NA Hair care products removed Battery operated devices (external) removed NPWT pump Heating patches and chemical warmers removed Titanium eyewear removed NA Nail polish cured greater than 10 hours NA Casting material cured greater than 10 hours NA Hearing aids removed NA Loose dentures or partials removed NA Prosthetics have been removed NA Patient demonstrates correct use of air  break device (if applicable) Patient concerns have been addressed Patient grounding bracelet on and cord attached to chamber Specifics for Inpatients (complete in addition to above) Medication sheet sent with patient Intravenous medications needed or due during therapy sent with patient Drainage tubes (e.g. nasogastric tube or chest tube secured and  vented) Endotracheal or Tracheotomy tube secured Cuff deflated of air and inflated with saline Airway suctioned Electronic Signature(s) DAKAR, STEGNER ([id number]) Signed: 06/15/2019 4:18:38 PM By: Lorine Bears RCP, RRT, CHT Entered By: Lorine Bears on 06/15/2019 13:19:01

## 2019-06-20 ENCOUNTER — Encounter: Payer: Medicare Other | Admitting: Physician Assistant

## 2019-06-20 ENCOUNTER — Other Ambulatory Visit: Payer: Self-pay

## 2019-06-20 DIAGNOSIS — L89314 Pressure ulcer of right buttock, stage 4: Secondary | ICD-10-CM | POA: Diagnosis not present

## 2019-06-20 NOTE — Progress Notes (Signed)
[name], [name] ([id number]) Visit Report for 06/20/2019 Arrival Information Details Patient Name: [name], [name] Date of Service: 06/20/2019 8:00 AM Medical Record Number: [id number] Patient Account Number: [id number] Date of Birth/Sex: June 01, 1951 (68 y.o. M) Treating RN: Harold Barban Primary Care Aksh Swart: [name] Other Clinician: [name] Referring Ziasia Lenoir: [name] Treating Monique Gift/Extender: [name], HOYT Weeks in Treatment: 1 Visit Information History Since Last Visit Added or deleted any medications: No Patient Arrived: Wheel Chair Any new allergies or adverse reactions: No Arrival Time: 08:00 Had a fall or experienced change in No Accompanied By: wife activities of daily living that may affect Transfer Assistance: Harrel Lemon Lift risk of falls: Patient Identification Verified: Yes Signs or symptoms of abuse/neglect since last visito No Secondary Verification Process Completed: Yes Hospitalized since last visit: No Patient Requires Transmission-Based No Implantable device outside of the clinic excluding No Precautions: cellular tissue based products placed in the center Patient Has Alerts: Yes since last visit: Patient Alerts: NOT Has Dressing in Place as Prescribed: Yes Diabetic Pain Present Now: No Electronic Signature(s) Signed: 06/20/2019 12:35:11 PM By: Lorine Bears RCP, RRT, CHT Entered By: Lorine Bears on 06/20/2019 10:01:40 ZAM, ENT ([id number]) -------------------------------------------------------------------------------- Encounter Discharge Information Details Patient Name: [name] Date of Service: 06/20/2019 8:00 AM Medical Record Number: [id number] Patient Account Number: [id number] Date of Birth/Sex: 05-25-1951 (68 y.o. M) Treating RN: Harold Barban Primary Care Seraphina Mitchner: [name] Other Clinician: [name] Referring Mitzy Naron: [name] Treating Farhad Burleson/Extender: [name], HOYT Weeks in Treatment: 43 Encounter Discharge Information Items Discharge Condition: Stable Ambulatory Status: Wheelchair Discharge Destination: Home Transportation: Private Auto Accompanied By: daughter, Joelene Millin Schedule Follow-up Appointment: Yes Clinical Summary of Care: Notes Patient has an HBO treatment scheduled on 06/21/2019 at 08:00 am. Electronic Signature(s) Signed: 06/20/2019 12:35:11 PM By: Lorine Bears RCP, RRT, CHT Entered By: Lorine Bears on 06/20/2019 10:26:45 LOUDEN, POULIOT ([id number]) -------------------------------------------------------------------------------- Vitals Details Patient Name: [name] Date of Service: 06/20/2019 8:00 AM Medical Record Number: [id number] Patient Account Number: [id number] Date of Birth/Sex: 05/17/1951 (68 y.o. M) Treating RN: Harold Barban Primary Care Khylei Wilms: [name] Other Clinician: [name] Referring Julias Mould: [name] Treating Ninah Moccio/Extender: [name], HOYT Weeks in Treatment: 26 Vital Signs Time Taken: 08:16 Temperature (F): 99.0 Height (in): 73 Pulse (bpm): 60 Weight (lbs): 210 Respiratory Rate (breaths/min): 16 Body Mass Index (BMI): 27.7 Blood Pressure (mmHg): 120/72 Reference Range: 80 - 120 mg / dl Electronic Signature(s) Signed: 06/20/2019 12:35:11 PM By: Lorine Bears RCP, RRT, CHT Entered By: Lorine Bears on 06/20/2019 10:03:26

## 2019-06-21 ENCOUNTER — Encounter: Payer: Medicare Other | Attending: Physician Assistant | Admitting: Physician Assistant

## 2019-06-21 DIAGNOSIS — I10 Essential (primary) hypertension: Secondary | ICD-10-CM | POA: Insufficient documentation

## 2019-06-21 DIAGNOSIS — C61 Malignant neoplasm of prostate: Secondary | ICD-10-CM | POA: Diagnosis not present

## 2019-06-21 DIAGNOSIS — M8668 Other chronic osteomyelitis, other site: Secondary | ICD-10-CM | POA: Insufficient documentation

## 2019-06-21 DIAGNOSIS — Z8249 Family history of ischemic heart disease and other diseases of the circulatory system: Secondary | ICD-10-CM | POA: Insufficient documentation

## 2019-06-21 DIAGNOSIS — G8220 Paraplegia, unspecified: Secondary | ICD-10-CM | POA: Diagnosis not present

## 2019-06-21 DIAGNOSIS — L03317 Cellulitis of buttock: Secondary | ICD-10-CM | POA: Diagnosis not present

## 2019-06-21 DIAGNOSIS — L89314 Pressure ulcer of right buttock, stage 4: Secondary | ICD-10-CM | POA: Insufficient documentation

## 2019-06-21 DIAGNOSIS — Z880 Allergy status to penicillin: Secondary | ICD-10-CM | POA: Insufficient documentation

## 2019-06-21 NOTE — Progress Notes (Signed)
[name], [name] ([id number]) Visit Report for 06/21/2019 Arrival Information Details Patient Name: [name], [name] Date of Service: 06/21/2019 8:00 AM Medical Record Number: [id number] Patient Account Number: [id number] Date of Birth/Sex: [birth year] (68 y.o. M) Treating RN: Montey Hora Primary Care Janith Nielson: [name] Other Clinician: [name] Referring Namiah Dunnavant: [name] Treating Celinda Dethlefs/Extender: [name], HOYT Weeks in Treatment: 32 Visit Information History Since Last Visit Added or deleted any medications: No Patient Arrived: Wheel Chair Any new allergies or adverse reactions: No Arrival Time: 08:00 Had a fall or experienced change in No Accompanied By: wife activities of daily living that may affect Transfer Assistance: Harrel Lemon Lift risk of falls: Patient Identification Verified: Yes Signs or symptoms of abuse/neglect since last visito No Secondary Verification Process Completed: Yes Hospitalized since last visit: No Patient Requires Transmission-Based No Implantable device outside of the clinic excluding No Precautions: cellular tissue based products placed in the center Patient Has Alerts: Yes since last visit: Patient Alerts: NOT Has Dressing in Place as Prescribed: Yes Diabetic Pain Present Now: No Electronic Signature(s) Signed: 06/21/2019 12:01:49 PM By: Lorine Bears RCP, RRT, CHT Entered By: Lorine Bears on 06/21/2019 08:53:17 [name], [name] ([id number]) -------------------------------------------------------------------------------- Encounter Discharge Information Details Patient Name: [name] Date of Service: 06/21/2019 8:00 AM Medical Record Number: [id number] Patient Account Number: [id number] Date of Birth/Sex: [birth year] (68 y.o. M) Treating RN: Montey Hora Primary Care Zahari Fazzino: [name] Other Clinician: [name] Referring Barbaraann Avans: [name] Treating Catrena Vari/Extender: [name], HOYT Weeks in Treatment: 60 Encounter Discharge Information Items Discharge Condition: Stable Ambulatory Status: Wheelchair Discharge Destination: Home Transportation: Private Auto Accompanied By: wife Schedule Follow-up Appointment: Yes Clinical Summary of Care: Notes Patient has an HBO treatment scheduled on 06/22/2019 at 08:00 am. Electronic Signature(s) Signed: 06/21/2019 12:01:49 PM By: Lorine Bears RCP, RRT, CHT Entered By: Lorine Bears on 06/21/2019 10:53:16 [name], [name] ([id number]) -------------------------------------------------------------------------------- Vitals Details Patient Name: [name] Date of Service: 06/21/2019 8:00 AM Medical Record Number: [id number] Patient Account Number: [id number] Date of Birth/Sex: 05-Jan-1951 (68 y.o. M) Treating RN: Montey Hora Primary Care Danene Montijo: [name] Other Clinician: [name] Referring Elberta Lachapelle: [name] Treating Ansleigh Safer/Extender: [name], HOYT Weeks in Treatment: 76 Vital Signs Time Taken: 08:21 Temperature (F): 98.8 Height (in): 73 Pulse (bpm): 72 Weight (lbs): 210 Respiratory Rate (breaths/min): 16 Body Mass Index (BMI): 27.7 Blood Pressure (mmHg): 126/74 Reference Range: 80 - 120 mg / dl Electronic Signature(s) Signed: 06/21/2019 12:01:49 PM By: Lorine Bears RCP, RRT, CHT Entered By: Lorine Bears on 06/21/2019 08:53:50

## 2019-06-22 ENCOUNTER — Other Ambulatory Visit: Payer: Self-pay

## 2019-06-22 ENCOUNTER — Encounter: Payer: Medicare Other | Admitting: Internal Medicine

## 2019-06-22 DIAGNOSIS — L89314 Pressure ulcer of right buttock, stage 4: Secondary | ICD-10-CM | POA: Diagnosis not present

## 2019-06-22 NOTE — Progress Notes (Signed)
[name] ([id number]) Visit Report for 06/20/2019 Chief Complaint Document Details Patient Name: [name] Date of Service: 06/20/2019 10:15 AM Medical Record Number: [id number] Patient Account Number: [id number] Date of Birth/Sex: May 28, 1951 (68 y.o. M) Treating RN: Harold Barban Primary Care Provider: [name] Other Clinician: Referring Provider: [name] Treating Provider/Extender: [name], Norris Brumbach Weeks in Treatment: 44 Information Obtained from: Patient Chief Complaint Right gluteal fold ulcer Electronic Signature(s) Signed: 06/21/2019 5:47:36 PM By: Worthy Keeler [name] Entered By: Worthy Keeler on 06/20/2019 10:28:17 [name] ([id number]) -------------------------------------------------------------------------------- HPI Details Patient Name: [name] Date of Service: 06/20/2019 10:15 AM Medical Record Number: [id number] Patient Account Number: [id number] Date of Birth/Sex: [birth year] (68 y.o. 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 He states that there is no history of diabetes that he is aware of. He does have issues with his prostate and is currently receiving treatment for this by way of oral medication. With that being said I do not have a lot of details in that regard. Nonetheless the patient presents today as a result of having been referred to [place] by another provider initially home health was set to come out and take care of his wound although due to the fact  that he apparently drives he's not able to receive home health. His wife is therefore trying to help take care of this wound within although they have been struggling with what exactly to do at this point. She states that she can do some things but she is definitely not a nurse and does have some issues with looking at blood. The good news is the wound does not appear to be too deep and is fairly superficial at this point. There is no slough noted there is some nonviable skin noted around the surface of the wound and the perimeter at this point. The central portion of the wound appears to be very good with a dermal layer noted this does not appear to be again deep enough to extend it to subcutaneous tissue at this point. 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 In the pump he has Dilaudid and baclofen. 12/07/17 on evaluation today patient presents for follow-up concerning his ongoing lower back thigh ulcer on the right. He states that he did not get the supplies ordered and therefore has not really been able to perform the dressing changes as directed exactly. His wife was able to get some Boarder Foam Dressing's from the drugstore and subsequently has been using hydrogel which did help to a degree in the wound does appear to be able smaller. There is actually more drainage this week noted than previous. 12/21/17 on evaluation today patient appears to be doing rather well in regard to his right gluteal ulcer. He has been tolerating the dressing changes without complication. There does not appear to be any evidence of infection at this point in time. Overall the wound does seem to be making some progress as far as the edges  are concerned there's not as much in the way of overlapping of the external wound edges and he has a good epithelium to wound bed border for the most part. This however is not  true right at the 12 o'clock location over the span of a little over a centimeters which actually will require debridement today to clean this away and hopefully allow it to continue to heal more appropriately. 12/28/17 on evaluation today patient appears to be doing rather well in regard to his ulcer in the left gluteal region. He's been tolerating the dressing changes without complication. Apparently he has had some difficulty getting his dressing material. Apparently there's been some confusion with ordering we're gonna check into this. Nonetheless overall he's been showing signs of improvement which is good news. Debridement is not required today. 01/04/18 on evaluation today patient presents for follow-up concerning his right gluteal ulcer. He has been tolerating the dressing changes fairly well. On inspection today it appears he may actually have some maceration them concerned about the fact that he may be developing too much moisture in and around the wound bed which can cause delay in healing. With that being said he unfortunately really has not showed significant signs of improvement since last week's evaluation in fact this may even be just the little bit/slightly larger. Nonetheless he's been having a lot of discomfort I'm not sure this is even related to the wound as he has no pain when I'm to breeding or otherwise cleaning the wound during evaluation today. Nonetheless this is something that we did recommend he talked to his pain specialist concerning. 01/11/18 on evaluation today patient appears to be doing better in regard to his ulceration. He has been tolerating the dressing changes without complication. With that being said overall there's no evidence of infection which is good news. The only thing is he did receive the hatch affair blue classic versus the ready nonetheless I feel like this is perfectly fine and appears to have done well for him over the past week. 01/25/18 on  evaluation today patient's wound actually appears to be a little bit larger than during the last evaluation. The good [name]. ([id number]) news is the majority of the wound edges actually appear to be fairly firmly attached to the wound bed unfortunately again we're not really making progress in regard to the size. Roughly the wound is about the same size as when I first saw him although again the wound margin/edges appear to be much better. 02/01/18 on evaluation today patient actually appears to be doing very well in regard to his wound. Applying the Prisma dry does seem to be better although he does still have issues with slow progression of the wound. There was a slight improvement compared to last week's measurements today. Nonetheless I have been considering other options as far as the possibility of Theraskin or even a snap vac. In general I'm not sure that the Theraskin due to location of the wound would be a very good idea. Nonetheless I do think that a snap vac could be a possibility for the patient and in fact I think this could even be an excellent way to manage the wound possibly seeing some improvement in a very rapid fashion here. Nonetheless this is something that we would need to get approved and I did have a lengthy conversation with the patient about this today. 02/08/18 on evaluation today patient appears to be doing a little better in regard to his ulcer.  He has been tolerating the dressing changes without complication. Fortunately despite the fact that the wound is a little bit smaller it's not significantly so unfortunately. We have discussed the possibility of a snap vac we did check with insurance this is actually covered at this point. Fortunately there does not appear to be any sign of infection. Overall I'm fairly pleased with how things seem to be appearing at this point. 02/15/18 on evaluation today patient appears to be doing rather well in regard to his right  gluteal ulcer. Unfortunately the snap vac did not stay in place with his sheer and friction this came loose and did not seem to maintain seal very well. He worked for about two days and it did seem to do very well during that time according to his wife but in general this does not seem to be something that's gonna be beneficial for him long-term. I do believe we need to go back to standard dressings to see if we can find something that will be of benefit. 03/02/18- He is here in follow up evaluation; there is minimal change in the wound. He will continue with the same treatment plan, would consider changing to iodosrob/iodoflex if ulcer continues to to plateau. He will follow up next week 03/08/18 on evaluation today patient's wound actually appears to be about the same size as when I previously saw him several weeks back. Unfortunately he does have some slightly dark discoloration in the central portion of the wound which has me concerned about pressure injury. I do believe he may be sitting for too long a period of time in fact he tells me that "I probably sit for much too long". He does have some Slough noted on the surface of the wound and again as far as the size of the wound is concerned I'm really not seeing anything that seems to have improved significantly. 03/15/18 on evaluation today patient appears to be doing fairly well in regard to his ulcer. The wound measured pretty much about the same today compared to last week's evaluation when looking at his graph. With that being said the area of bruising/deep tissue injury that was noted last week I do not see at this point. He did get a new cushion fortunately this does seem to be have been of benefit in my pinion. It does appear that he's been off of this more which is good news as well I think that is definitely showing in the overall wound measurements. With that being said I do believe that he needs to continue to offload I don't think that  the fact this is doing better should be or is going to allow him to not have to offload and explain this to him as well. Overall he seems to be in agreement the plan I think he understands. The overall appearance of the wound bed is improved compared to last week I think the Iodoflex has been beneficial in that regard. 03/29/18 on evaluation today patient actually appears to be doing rather well in regard to his wound from the overall appearance standpoint he does have some granulation although there's some Slough on the surface of the wound noted as well. With that being said he unfortunately has not improved in regard to the overall measurement of the wound in volume or in size. I did have a discussion with him very specifically about offloading today. He actually does work although he mainly is just sitting throughout the day. He tells me  he offloads by "lifting himself up for 30 seconds off of his chair occasionally" purchase from [employer] which does seem to have helped. And he has a new cushion that he with that being said he's also able to stand some for a very short period of time but not significant enough I think to provide appropriate offloading. I think the biggest issue at this point with the wound and the fact is not healing as quickly as we would like is due to the fact that he is really not able to appropriately offload while at work. He states the beginning after his injury he actually had a bed at his job that he could lay on in order to offload and that does seem to have been of help back at that time. Nonetheless he had not done this in quite some time unfortunately. I think that could be helpful for him this is something I would like for him to look into. 04/05/18 on evaluation today patient actually presents for follow-up concerning his right gluteal ulcer. Again he really is not significantly improved even compared to last week. He has been tolerating the dressing changes  without complication. With that being said fortunately there appears to be no evidence of infection at this time. He has been more proactive in trying to offload. MALOSI, NIEMCZYK ([id number]) 04/12/18 on evaluation today patient actually appears to be doing a little better in regard to his wound and the right gluteal fold region. He's been tolerating the dressing changes since removing the oasis without complication. However he was having a lot of burning initially with the oasis in place. He's unsure of exactly why this was given so much discomfort but he assumes that it was the oasis itself causing the problem. Nonetheless this had to be removed after about three days in place although even those three days seem to have made a fairly good improvement in regard to the overall appearance of the wound bed. In fact is the first time that he's made any improvement from the standpoint of measurements in about six weeks. He continues to have no discomfort over the area of the wound itself which leads me to wonder why he was having the burning with the oasis when he does not even feel the actual debridement's themselves. I am somewhat perplexed by this. 04/19/18 on evaluation today patient's wound actually appears to be showing signs of epithelialization around the edge of the wound and in general actually appears to be doing better which is good news. He did have the same burning after about three days with applying the Endoform last week in the same fashion that I would generally apply a skin substitute. This seems to indicate that it's not the oasis to cause the problem but potentially the moisture buildup that just causes things to burn or there may be some other reaction with the skin prep or Steri-Strips. Nonetheless I'm not sure that is gonna be able to tolerate any skin substitute for a long period of time. The good news is the wound actually appears to be doing better today compared to last  week and does seem to finally be making some progress. 04/26/18 on evaluation today patient actually appears to be doing rather well in regard to his ulcer in the right gluteal fold. He has been tolerating the dressing changes without complication which is good news. The Endoform does seem to be helping although he was a little bit more macerated this week. This  seems to be an ongoing issue with fluid control at this point. Nonetheless I think we may be able to add something like Drawtex to help control the drainage. 05/03/18 on evaluation today patient appears to actually be doing better in regard to the overall appearance of his wound. He has been tolerating the dressing changes without complication. Fortunately there appears to be no evidence of infection at this time. I really feel like his wound has shown signs as of today of turning around last week I thought so as well and definitely he could be seen in this week's overall appearance and measurements. In general I'm very pleased with the fact that he finally seems to be making a steady but sure progress. The patient likewise is very pleased. 05/17/18 on evaluation today patient appears to be doing more poorly unfortunately in regard to his ulcer. He has been tolerating the dressing changes without complication. With that being said he tells me that in the past couple of days he and his wife have noticed that we did not seem to be doing quite as well is getting dark near the center. Subsequently upon evaluation today the wound actually does appear to be doing worse compared to previous. He has been tolerating the dressing changes otherwise and he states that he is not been sitting up anymore than he was in the past from what he tells me. Still he has continued to work he states "I'm tired of dealing with this and if I have to just go home and lay in the bed all the time that's what I'll do". Nonetheless I am concerned about the fact that this  wound does appear to be deeper than what it was previous. 05/24/18 upon evaluation today patient actually presents after having been in the hospital due to what was presumed to be sepsis secondary to the wound infection. He had an elevated white blood cell count between 14 and 15. With that being said he does seem to be doing somewhat better now. His wound still is giving him some trouble nonetheless and he is obviously concerned about the fact likely talked about that this does seem to go more deeply than previously noted. I did review his wound culture which showed evidence of Staphylococcus aureus him and group B strep. Nonetheless he is on antibiotics, Levaquin, for this. Subsequently I did review his intake summary from the hospital as well. I also did look at the CT of the lumbar spine with contrast that was performed which showed no bone destruction to suggest lumbar disguises/osteomyelitis or sacral osteomyelitis. There was no paraspinal abscess. Nonetheless it appears this may have been more of just a soft tissue infection at this point which is good news. He still is nonetheless concerned about the wound which again I think is completely reasonable considering everything he's been through recently. 05/31/18 on evaluation today on evaluation today patient actually appears to be showing signs of his wound be a little bit deeper than what I would like to see. Fortunately he does not show any signs of significant infection although his temperature was 99 today he states he's been checking this at home and has not been elevated. Nonetheless with the undermining that I'm seeing at this point I am becoming more concerned about the wound I do think that offloading is a key factor here that is preventing the speedy recovery at this point. There does not appear to be any evidence of again over infection noted. He's been using  Santyl currently. 06/07/18 the patient presents today for follow-up  evaluation regarding the left ulcer in the gluteal region. He has been tolerating the Wound VAC fairly well. He is obviously very frustrated with this he states that to mean is really getting in his way. There does not appear to be any evidence of infection at this time he does have a little bit of odor I do not necessarily associate this with infection just something that we sometimes notice with Wound VAC therapy. With that being said I can definitely catch a tone of discontentment overall in the patient's demeanor today. This when he was previously in the hospital CRANSTON, ATILES. ([id number]) an CT scan was done of the lumbar region which did not reveal any signs of osteomyelitis. With that being said the pelvis in particular was not evaluated distinctly which means he could still have some osteonecrosis I. Nonetheless the Wound VAC was started on Thursday I do want to get this little bit more time before jumping to a CT scan of the pelvis although that is something that I might would recommend if were not see an improvement by that time. 06/14/18 on evaluation today patient actually appears to be doing about the same in regard to his right gluteal ulcer. Again he did have a CT scan of the lumbar spine unfortunately this did not include the pelvis. Nonetheless with the depth of the wound that I'm seeing today even despite the fact that I'm not seeing any evidence of overt cellulitis I believe there's a good chance that we may be dealing with osteomyelitis somewhere in the right Ischial region. No fevers, chills, nausea, or vomiting noted at this time. 06/21/18 on evaluation today patient actually appears to be doing about the same with regard to his wound. The tunnel at 6 o'clock really does not appear to be any deeper although it is a little bit wider. I think at this point you may want to start packing this with white phone. Unfortunately I have not got approval for the CT scan of the pelvis  as of yet due to the fact that Medicare apparently has been denied it due to the diagnosis codes not being appropriate according to Medicare for the test requested. With that being said the patient cannot have an MRI and therefore this is the only option that we have as far as testing is concerned. The patient has had infection and was on antibiotics and been added code for cellulitis of the bottom to see if this will be appropriate for getting the test approved. Nonetheless I'm concerned about the infection have been spread deeper into the Ischial region. 06/28/18 on evaluation today patient actually appears to be doing rather well all things considered in regard to the right gluteal ulcer. He has been tolerating the dressing changes without complication. With that being said the Wound VAC he states does have to be replaced almost every day or at least reinforced unfortunately. Patient actually has his CT scan later this morning we should have the results by tomorrow. 07/05/18 on evaluation today patient presents for follow-up concerning his right Ischial ulcer. He did see the surgeon Dr. Lysle Pearl last week. They were actually very happy with him and felt like he spent a tremendous amount of time with them as far as discussing his situation was concerned. In the end Dr. Lysle Pearl did contact me as well and determine that he would not recommend any surgical intervention at this point as he felt like  it would not be in the patient's best interest based on what he was seeing. He recommended a referral to infectious disease. Subsequently this is something that Dr. Ines Bloomer office is working on setting up for the patient. As far as evaluation today is concerned the patient's wound actually appears to be worse at this point. I am concerned about how things are progressing and specifically about infection. I do not feel like it's the deeper but the area of depth is definitely widening which does have me concerned.  No fevers, chills, nausea, or vomiting noted at this time. I think that we do need initiate antibiotic therapy the patient has an allow allergy to amoxicillin/penicillin he states that he gets a rash since childhood. Nonetheless she's never had the issues with Catholics or cephalosporins in general but he is aware of. 07/27/18 on evaluation today patient presents following admission to the hospital on 07/09/18. He was subsequently discharged on 07/20/18. On 07/15/18 the patient underwent irrigation and debridement was soft tissue biopsy and bone biopsy as well as placement of a Wound VAC in the OR by Dr. Celine Ahr. During the hospital course the patient was placed on a Wound VAC and recommended follow up with surgery in three weeks actually with Dr. Delaine Lame who is infectious disease. The patient was on vancomycin during the hospital course. 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 He also had a bone culture which revealed evidence of methicillin-resistant staph aureus. He is updated CT scan 07/09/18 reveals that he had progression of the which was performed on wound to breakdown down to the trochanter where he actually had irregularities there as well suggestive of osteomyelitis. This was a change just since 9 December when we last performed a CT scan. Obviously this one had gone downhill quite significantly and rapidly. At this point upon evaluation I feel like in general the patient's wound seems to be doing fairly well all things considered upon my evaluation today. Obviously this is larger and deeper than what I previously evaluated but at the same time he seems to be making some progress as far as the appearance of the granulation tissue is concerned. I'm happy in that regard. No fevers, chills, nausea, or vomiting noted at this time. He is on IV vancomycin and Rocephin at the facility. He is currently in [employer]. 08/03/18 upon evaluation today  patient's wound appears to be doing better in regard to the overall appearance at this point in time. Fortunately he's been tolerating the Wound VAC without complication and states that the facility has been taking excellent care of the wound site. Overall I see some Slough noted on the surface which I am going to attempt sharp debridement today of but nonetheless other than this I feel like he's making progress. 08/09/18 on evaluation today patient's wound appears to be doing much better compared to even last week's evaluation. Do believe that the Wound VAC is been of great benefit for him. He has been tolerating the dressing changes that is the Wound REVIN, BUTZER. ([id number]) VAC without any complication and he has excellent granulation noted currently. There is no need for sharp debridement at this point. 08/16/18 on evaluation today patient actually appears to be doing very well in regard to the wound in the right gluteal fold region. This is showing signs of progress and again appears to be very healthy which is excellent news. Fortunately there is no sign of active infection by way of odor  or drainage at this point. Overall I'm very pleased with how things stand. He seems to be tolerating the Wound VAC without complication. 08/23/18 on evaluation today patient actually appears to be doing better in regard to his wound. He has been tolerating the Wound VAC without complication and in fact it has been collecting a significant amount of drainage which I think is good news especially considering how the wound appears. Fortunately there is no signs of infection at this time definitely nothing appears to be worse which is good news. He has not been started on the Bactrim and Flagyl that was recommended by Dr. Delaine Lame yet. I did actually contact her office this morning in order to check and see were things are that regard their gonna be calling me back. 08/30/18 on evaluation today patient  actually appears to show signs of excellent improvement today compared to last evaluation. The undermining is getting much better the wound seems to be feeling quite nicely and I'm very pleased that the granulation in general. With that being said overall I feel like the patient has made excellent progress which is great news. No fevers, chills, nausea, or vomiting noted at this time. 09/06/18 on evaluation today patient actually appears to be doing rather well in regard to his right gluteal ulcer. This is showing signs of improvement in overall I'm very pleased with how things seem to be progressing. The patient likewise is please. Overall I see no evidence of infection he is about to complete his oral antibiotic regimen which is the end of the antibiotics for him in just about three days. 09/13/18 on evaluation today patient's right Ischial ulcer appears to be showing signs of continued improvement which is excellent news. He's been tolerating the dressing changes without complication. Fortunately there's no signs of infection and the wound that seems to be doing very well. 09/28/18 on evaluation today patient appears to be doing rather well in regard to his right Ischial ulcer. He's been tolerating the Wound VAC without complication he knows there's much less drainage than there used to be this obviously is not a bad thing in my pinion. There's no evidence of infection despite the fact is but nothing about it now for several weeks. 10/04/18 on evaluation today patient appears to be doing better in regard to his right Ischial wound. He has been tolerating the Wound VAC without complication and I do believe that the silver nitrate last week was beneficial for him. Fortunately overall there's no evidence of active infection at this time which is great news. No fevers, chills, nausea, or vomiting noted at this time. 10/11/18 on evaluation today patient actually appears to be doing rather well in regard  to his Ischial ulcer. He's been tolerating the Wound VAC still without complication I feel like this is doing a good job. No fevers, chills, nausea, or vomiting noted at this time. 11/01/18 on evaluation today patient presents after having not been seen in our clinic for several weeks secondary to the fact that he was on evaluation today patient presents after having not been seen in our clinic for several weeks secondary to the fact that he was in a skilled nursing facility which was on lockdown currently due to the covert 19 national emergency. Subsequently he was discharged from the facility on this past Friday and subsequently made an appointment to come in to see yesterday. Fortunately there's no signs of active infection at this time which is good news and overall he does  seem to have made progress since I last saw. Overall I feel like things are progressing quite nicely. The patient is having no pain. 11/08/18 on evaluation today patient appears to be doing okay in regard to his right gluteal ulcer. He has been utilizing a Wound VAC home health this changing this at this point since he's home from the skilled nursing facility. Fortunately there's no signs of obvious active infection at this time. Unfortunately though there's no obvious active infection he is having some maceration and his wife states that when the sheets of the Wound VAC office on Sunday when it broke seal that he ended up having significant issues with some smell as well there concerned about the possibility of infection. Fortunately there's No fevers, chills, nausea, or vomiting noted at this time. 11/15/18 on evaluation today patient actually appears to be doing well in regard to his right gluteal ulcer. He has been tolerating the dressing changes without complication. Specifically the Wound VAC has been utilized up to this point. Fortunately there's no signs of infection and overall I feel like he has made progress even  since last week when I last saw him. I'm actually fairly happy with the overall appearance although he does seem to have somewhat of a hyper granular Wisdom, Haakon J. ([id number]) overgrowth in the central portion of the wound which I think may require some sharp debridement to try flatness out possibly utilizing chemical cauterization following. 11/23/18 on evaluation today patient actually appears to be doing very well in regard to his sacral ulcer. He seems to be showing signs of improvement with good granulation. With that being said he still has the small area of hyper granulation right in the central portion of the wound which I'm gonna likely utilize silver nitrate on today. Subsequently he also keeps having a leak at the 6 o'clock location which is unfortunate we may be able to help out with some suggestions to try to prevent this going forward. Fortunately there's no signs of active infection at this time. 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 He's been tolerating the dressing changes without complication. Fortunately there's no signs of active infection at this time. I've been rather pleased with how things have progressed there still some evidence of pressure getting to the area with some redness right around the immediate wound opening. Nonetheless other than this I'm not seeing any significant complications or issues the wound is somewhat hyper granular. Upon discussing with the patient and his wife today I'm not sure that the wound is being packed to the base with the foam at this point. And if it's not been packed fully that may be part of the reason why is not seen as much improvement as far as the granulation from the base out. Again we do not want pack too tightly but we need some of the firm to get to the base of the wound. I discussed this with patient and his wife  today. 12/06/18 on evaluation today patient appears to be doing well in regard to his right gluteal pressure ulcer. He's been tolerating the dressing changes without complication. Fortunately there's no signs of active infection. He still has some hyper granular tissue and I do think it would be appropriate to continue with the chemical cauterization as of today. 12/16/18 on evaluation today patient actually appears to be doing okay in regard to his right  gluteal ulcer. He is been tolerating the dressing changes without complication including the Wound VAC. Overall I feel like nothing seems to be worsening I do feel like that the hyper granulation buds in the central portion of the wound have improved to some degree with the silver nitrate. We will have to see how things continue to progress. 12/20/18 on evaluation today patient actually appears to be doing much worse in my pinion even compared to last week's evaluation. Unfortunately as opposed to showing any signs of improvement the areas of hyper granular tissue in the central portion of the wound seem to be getting worse. Subsequently the wound bed itself also seems to be getting deeper even compared to last week which is both unusual as well as concerning since prior he had been shown signs of improvement. Nonetheless I think that the issue could be that he's actually having some difficulty in issues with a deeper infection. There's no external signs of infection but nonetheless I am more worried about the internal, osteomyelitis, that could be restarting. He has not been on antibiotics for some time at this point. I think that it may be a good idea to go ahead and started back on an antibiotic therapy while we wait to see what the testing shows. 12/27/18 on evaluation today patient presents for follow-up concerning his left gluteal fold wound. Fortunately he appears to be doing well today. I did review the CT scan which was negative for any signs of  osteomyelitis or acute abnormality this is excellent news. Overall I feel like the surface of the wound bed appears to be doing significantly better today compared to previously noted findings. There does not appear any signs of infection nor does he have any pain at this time. 01/03/19 on evaluation today patient actually appears to be doing quite well in regard to his ulcer. Post debridement last week he really did not have too much bleeding which is good news. Fortunately today this seems to be doing some better but we still has some of the hyper granular tissue noted in the base of the wound which is gonna require sharp debridement today as well. Overall I'm pleased with how things seem to be progressing since we switched away from the Wound VAC I think he is making some progress. 01/10/19 on evaluation today patient appears to be doing better in regard to his right gluteal fold ulcer. He has been tolerating the dressing changes without complication. The debridement to seem to be helping with current away some of the poor hyper granular tissue bugs throughout the region of his gluteal fold wound. He's been tolerating the dressing changes otherwise without complication which is great news. No fevers, chills, nausea, or vomiting noted at this time. 01/17/19 on evaluation today patient actually appears to be doing excellent in regard to his wound. He's been tolerating the dressing changes without complication. Fortunately there is no signs of active infection at this time which is great news. No fevers, chills, nausea, or vomiting noted at this time. 01/24/19 on evaluation today patient actually appears to be doing quite well with regard to his ulcer. He has been tolerating the dressing changes without complication. Fortunately there's no signs of active infection at this time. Overall been very pleased with the progress that he seems to be making currently. 01/31/19-Patient returns at 1 week with  apparent similarity in dimensions to the wound, with no signs of infection, he has been [place] ([id number]) changing dressings twice  a day 02/08/19 upon evaluation today patient actually appears to be doing well with regard to his right Ischial ulcer. The wound is not appear to be quite as deep and seems to be making progress which is good news. With that being said I'm still reluctant to go back to the Wound VAC at this point. He's been having to change the dressings twice a day which is a little bit much in my pinion from the wound care supplies standpoint. I think that possibly attempting to utilize extras orbit may be beneficial this may also help to prevent any additional breakdown secondary to fluid retention in the wound itself. The patient is in agreement with giving this a try. 02/15/19 on evaluation today patient actually appears to be doing decently well with regard to his ulcer in the right to gluteal fold location. He's been tolerating the dressing changes without complication. Fortunately there is no signs of active infection at this time. He is able to keep the current dressing in place more effectively for a day at a time whereas before he was having a changes to to three times a day. The actions or has been helpful in this regard. Fortunately there's no signs of anything getting worse and I do feel like he showing signs of good improvement with regard to the wound bed status. 02/22/2019 patient appears to be doing very well today with regard to his ulcer in the gluteal fold. Fortunately there is no signs of active infection and he has been tolerating the dressing changes without any complication. Overall extremely pleased with how things seem to be progressing. He has much less of the hyper granular projections within the wound these have slowly been debrided away and he seems to be doing well. The wound bed is more uniform. 03/01/19 on evaluation today patient appears to  be doing unfortunately about the same in regard to his gluteal ulcer. He's been tolerating the dressing changes without complication. Fortunately there's no signs of active infection at this time. With that being said he continues to develop these hyper granular projections which I'm unsure of exactly what they are and why they are rising. Nonetheless I explained to the patient that I do believe it would be a good idea for [place] to stand a biopsy sample for pathology to see if that can shed any light on what exactly may be going on here. Fortunately I do not see any obvious signs of infection. With that being said the patient has had a little bit more drainage this week apparently compared to last week. 03/08/2019 on evaluation today patient actually appears to look somewhat better with regard to the appearance of his wound bed at this time. This is good news. Overall I am very pleased with how things seem to have progressed just in the past week with a switch to the [facility] dressing. I think that has been beneficial for him. With that being said at this time the patient is concerned about his biopsy that I sent off last week unfortunately I do not have that report as of yet. Nonetheless we have called to obtain this and hopefully will hear back from the lab later this morning. 03/15/19 on evaluation today patient's wound actually appears to be doing okay today with regard to the overall appearance of the wound bed. He has been tolerating the dressing changes without complication he still has hyper granular tissue noted but fortunately that seems to be minimal at this point compared  to some of what we've seen in the past. Nonetheless I do think that he is still having some issues currently with some of this type of granulation the biopsy and since all showed nothing more than just evidence of granulation tissue. Therefore there really is nothing different to initiate or do at this point. 03/24/19 on  evaluation today patient appears to be doing a little better with regard to his ulcer. He's been tolerating the dressing changes without complication. Fortunately there is no signs of active infection at this time. No fevers, chills, nausea, or vomiting noted at this time. I'm overall pleased with how things seem to be progressing. 03/29/2019 on evaluation today patient appears to be doing about the same in regard to his ulcer in the right gluteal fold. Unfortunately he is not seeming to make a lot of progress and the wound is somewhat stalled. There is no signs of active infection externally but I am concerned about the possibility of infection continuing in the ischial location which previously he did have infection noted. Again is not able to have an MRI so probably our best option for testing for this would be a triple phase bone scan which will detect subtle changes in the bone more so than plain film x-rays. In the past they really have not been beneficial and in fact the CT scans even have been somewhat questionable at times. Nonetheless there is no signs of systemic infection which is at least good news but again his wound is not healing at all on the predicted schedule. 04/05/19 on evaluation today patient appears to be doing well all things considering with regard to his wound and the right gluteal fold. He actually has his triple bone scan scheduled for sometime in the next couple of days. With that being said I've also been looking to other possibilities of what could be causing hyper granular tissue were looking into the bone scan again for evaluating for the risk or possibility of infection deeper and I'm also gonna go ahead and see about obtaining a deep tissue culture today to send and see if there's any evidence of infection noted on culture. He's in agreement with that plan. 04/12/2019 on evaluation today patient presents for reevaluation here in our clinic concerning ongoing issues with  his right gluteal fold ulcer. 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 It was discussed with him at that point that I think it would be appropriate MYKEL, ZAPIEN. ([id number]) for him to proceed with hyperbaric oxygen therapy especially in light of the fact that he is previously been on IV antibiotics at the beginning of the year for close to 3 months followed by several weeks of oral antibiotics that was all prescribed by infectious disease. He had surgical debridement around Christmas December 2019 due to an abscess and osteomyelitis of the ischial bone. Unfortunately this has not really proceeded as well as we would have liked and again we did a CT scan even a couple months ago as he cannot have an MRI secondary to having issues with both a pain pump as well as a spinal cord stimulator which prevent him from going into an MRI machine. With that being said there were chronic changes noted at the ischial tuberosity which had progressed since December 2019 there was no evidence of fluid collection on that initial CT scan. With that being said  that was on January 21, 2019. I am not sure that it was a sensitive enough test as compared to an MRI and then subsequently I ordered a bone scan for him which was actually completed on 03/29/2019 and this revealed that he does indeed have positive osteomyelitis involving the right ischial tuberosity. This is adjacent to the ulcer and I think is the reason that his ulcer is not healing. Subsequently I am in a place him back on oral antibiotics today unfortunately his wound culture showed just mixed gram-negative flora with no specific findings of a predominant organism. Nonetheless being with the location I think a good broad-spectrum antibiotic for gram-negative's is a good choice at this point he is previously taken Levaquin without  significant issues and I think that is an appropriate antibiotic for him at this time. 04/19/2019 on evaluation today patient actually appears to be doing fairly well with regard to his wound. He has been tolerating the dressing changes without complication. Fortunately there is no signs of active infection and he has been taking the oral antibiotics at this time. Subsequently we did make a referral to infectious disease although Dr. Steva Ready wants the patient to be seen by general surgery first in order apparently to see if there is anything from a surgical standpoint that should be done prior to initiation of IV antibiotic therapy. Again the patient is okay with actually seeing Dr. Celine Ahr whom he has seen before we will make that referral. 04/26/2019 on evaluation today patient actually appears to be doing well with regard to his wound. He has been tolerating the dressing changes without complication. Fortunately there is no signs of active infection at this time. I do believe that the hyperbaric oxygen therapy along with the antibiotics which I prescribed at this point have been doing well for him. With that being said he has not seen Dr. Celine Ahr the surgeon yet in fact they have not been contacted for scheduling an appointment as of yet. Subsequently the patient is not on antibiotics currently by IV Dr. Steva Ready did want him to see Dr. Celine Ahr first which we are working on trying to get scheduled. We again give the information to the patient today for Dr. Celine Ahr and her number as far as contacting their office to see about getting something scheduled. Again were looking for whether or not they recommend any surgical intervention. 05/03/2019 on evaluation today patient appears to be doing about the same with regard to his wound there may be a little bit of filling in in regard to the base of the wound but still he has quite a bit of hyper granular tissue that I am seeing today. I believe that he  may may need a more aggressive sharp debridement possibly even by the surgeon or under anesthesia in order to clear away some of the hyper granular material in order to help allow for appropriate granulation to fill in. We have made a referral to Dr. Celine Ahr unfortunately it sounds as if they did not receive the referral we contacted them today they should be get in touch with the patient. Depending on how things show from that standpoint the patient may need to see Dr. Ardyth Gal who is the infectious disease specialist although he really does not want a PICC line again. No fevers, chills, nausea, vomiting, or diarrhea. He is tolerating hyperbaric oxygen therapy very well. 05/12/2019 on evaluation today patient presents for follow-up visit concerning his right gluteal fold ulcer. He did see Dr. Celine Ahr  the surgeon who previously evaluated his wound and she actually felt like he was doing quite well with regard to the wound based on what she was seen. He does seem to be responding to some degree with the oral antibiotics along with hyperbaric oxygen therapy at this point she did not see any evidence or need for further surgical intervention at this point. She recommended deferring additional or ongoing antibiotic therapy to Dr. Steva Ready at her discretion. Fortunately there is no signs of active systemic infection. No fevers, chills, nausea, vomiting, or diarrhea. 10/29; patient I am seeing really for the first time today in terms of his wound. 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 He is being treated with hyperbaric oxygen for underlying osteomyelitis most recently documented by a three-phase bone scan. He has been on Levaquin for roughly a month and he is out of these and asked me for a refill. Most recent cultures were negative although I do not think these were bone cultures. He has a very irregular surface to this wound which is almost nodular in texture. It  undermines superiorly with the same nodular hyper granulated surface. I see that he was referred to general surgery for an operative debridement although the surgeon did not agree with this I think that would have been the proper course of action in this. He has been using Hydrofera Blue. He is supposed to start a wound VAC tomorrow which is actually a reapplication of wound VAC. I think mostly last time they had trouble keeping the seal in place I hope we have better look at this this time. 05/24/2019 on evaluation today I did review patient's note from Dr. Dellia Nims where he was seen last week when I was out of the office. Subsequently it does appear that Dr. Dellia Nims was in agreement that the patient really needed debridement to clear away some of the nodular tissue in the base of the wound. The good news is he has the wound VAC initiated and some of this tissue is already starting to breakdown under the treatment of the wound VAC again because it is not really viable. LETO, BOL ([id number]) Nonetheless I do believe that we can likely perform some debridement today to clear this away and hopefully the continuation of the wound VAC along with hyperbarics and antibiotic therapy will be beneficial in stopping this from reoccurring. If we get better control in the wound bed in a good spot I think we can definitely use the PriMatrix which we actually did get approval for. 05/31/2019 on evaluation today patient appears to be doing actually pretty well at this point with regard to his wound. He has been tolerating the dressing changes which is good news. Fortunately there is no signs of infection. He still has some of the hyper granular nodules noted we have not really cleared all these away and I think we can try to do a little bit more that today. I did do quite a bit last week I am hoping to get down to the base of the wound so though actually be able to heal more appropriately. I think the  wound VAC is doing a good job right now with controlling the drainage and overall the patient is happy with that. 06/07/2019 upon evaluation today patient appears to be doing quite well compared to last week with regard to the hyper granulation in the base of the wound. He has been tolerating the dressing changes without complication.  Fortunately there is no signs of active infection at this time. With that being said we have not had any recent measures as far as blood work is concerned I think we may want to check a few things including a CBC, sed rate, and C-reactive protein. Patient is in agreement with that we can try to get that scheduled for him for this Friday. 06/13/2019 on evaluation today patient actually appears to be doing much better at this point based on what I am seeing with regard to his wound. He has much less in the way of hyper granular tissue which is good news and a lot more healing that is taken place since I last saw him as far as the amount of space within the wound itself. With that being said he is nearing the completion of the initial 40 treatments for hyperbaric oxygen therapy I think this is something I would recommend extending as he does seem to be benefiting at this time. Fortunately there is no evidence of active systemic infection at this point nor even local infection for that matter. 06/20/2019 upon evaluation today patient actually appears to be doing well with regard to his wound. In fact this appears to be doing much better today compared to what it was previous. I been extremely pleased with the progress that he is made. In fact the tunnel is much less deep today than what it was in the past and I am seeing excellent signs of improvement. Overall I am happy with how things are going. I did review his white blood cell count which revealed everything to be okay. The one abnormal finding on his CBC was a hemoglobin of 12.7. Subsequently I did also review his sed  rate and C-reactive protein. His sed rate measured in at normal level measuring at 26. The C-reactive protein however was elevated today and an abnormal range indicating inflammation. Still I feel like he is trending in a good direction at this time. He is going to need a refill of the Levaquin he tells me at this point. Electronic Signature(s) Signed: 06/21/2019 5:47:36 PM By: Worthy Keeler [name] Entered By: Worthy Keeler on 06/20/2019 13:29:31 ZORA, COHO ([id number]) -------------------------------------------------------------------------------- Physical Exam Details Patient Name: [name] Date of Service: 06/20/2019 10:15 AM Medical Record Number: [id number] Patient Account Number: [id number] Date of Birth/Sex: Dec 17, 1950 (68 y.o. M) Treating RN: Harold Barban Primary Care Provider: [name] Other Clinician: Referring Provider: [name] Treating Provider/Extender: [name], Season Astacio Weeks in Treatment: 52 Constitutional Well-nourished and well-hydrated in no acute distress. Respiratory normal breathing without difficulty. clear to auscultation bilaterally. Cardiovascular regular rate and rhythm with normal S1, S2. Psychiatric this patient is able to make decisions and demonstrates good insight into disease process. Alert and Oriented x 3. pleasant and cooperative. Notes Patient's wound bed currently did not require any sharp debridement although I did actually perform chemical cauterization with silver nitrate today which has seemed to be beneficial for him I definitely think the wound VAC is also beneficial. Overall been extremely pleased with how things seem to be progressing at this point. He is doing significantly better than what he was in the past. The patient likewise is extremely happy as well with what is going on at this time. Electronic Signature(s) Signed: 06/21/2019 5:47:36 PM By: Worthy Keeler [name] Entered By: Worthy Keeler  on 06/20/2019 13:30:02 HUMZAH, FILLA ([id number]) -------------------------------------------------------------------------------- Physician Orders Details Patient Name: [name] Date of Service: 06/20/2019 10:15  AM Medical Record Number: [id number] Patient Account Number: [id number] Date of Birth/Sex: 05-25-1951 (68 y.o. 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 - in office o Cleanse wound with mild soap and water Skin Barriers/Peri-Wound Care o Skin Prep Primary Wound Dressing Wound #1 Right Gluteal fold o Silver Collagen - under NPWT o Other: - in clinic - silvercel, xtrasorb, tegaderm Dressing Change Frequency Wound #1 Right Gluteal fold o Change Dressing Monday, Wednesday, Friday Follow-up Appointments Wound #1 Right Gluteal fold o Return Appointment in 2 weeks. Off-Loading Wound #1 Right Gluteal fold o Turn and reposition every 2 hours Home Health Wound #1 Right Gluteal fold o [facility] - Labs drawn on Friday, December 11th to include: CBC, Sed Rate, and C-Reactive Protein o Home Health Nurse may visit PRN to address patientos wound care needs. o FACE TO FACE ENCOUNTER: MEDICARE and MEDICAID PATIENTS: I certify that this patient is under my care and that I had a face-to-face encounter that meets the physician face-to-face encounter  requirements with this TYMAINE, COELLO ([id number]) patient on this date. The encounter with the patient was in whole or in part for the following MEDICAL CONDITION: (primary reason for [place]) MEDICAL NECESSITY: I certify, that based on my findings, NURSING services are a medically necessary home health service. HOME BOUND STATUS: I certify that my clinical findings support that this patient is homebound (i.e., Due to illness or injury, pt requires aid of supportive devices such as crutches, cane, wheelchairs, walkers, the use of special transportation or the assistance of another person to leave their place of residence. There is a normal inability to leave the home and doing so requires considerable and taxing effort. Other absences are for medical reasons / religious services and are infrequent or of short duration when for other reasons). o If current dressing causes regression in wound condition, may D/C ordered dressing product/s and apply Normal Saline Moist Dressing daily until next [place] / Other MD appointment. [place] of regression in wound condition at [phone]. o Please direct any NON-WOUND related issues/requests for orders to patient's Primary Care Physician Negative Pressure Wound Therapy Wound #1 Right Gluteal fold o Wound VAC settings at 125/130 mmHg continuous pressure. Use BLACK/GREEN foam to wound cavity. Use WHITE foam to fill any tunnel/s and/or undermining. Change VAC dressing 3 X WEEK. Change canister as indicated when full. Nurse may titrate settings and frequency of dressing changes as clinically indicated. o Home Health Nurse may d/c VAC for s/s of increased infection, significant wound regression, or uncontrolled drainage. [place] at [phone]. o Apply contact layer over base of wound. - apply prisma/silver collagen to the base of the wound o Number of foam/gauze pieces used in  the dressing = Hyperbaric Oxygen Therapy Wound #1 Right Gluteal fold o Evaluate for HBO Therapy o Indication: - Right ischial osteomyelitis o If appropriate for treatment, begin HBOT per protocol: o 2.0 ATA for 90 Minutes without Air Breaks o One treatment per day (delivered Monday through Friday  unless otherwise specified in Special Instructions below): o Total # of Treatments: - 60 Patient Medications Allergies: penicillin, doxycycline, clindamycin Notifications Medication Indication Start End Levaquin 06/21/2019 DOSE 1 - oral 500 mg tablet - 1 tablet oral taken daily for 14 days. Do not take iron with this medication Electronic Signature(s) Signed: 06/21/2019 8:28:03 AM By: Worthy Keeler [name] Previous Signature: 06/20/2019 11:13:52 AM Version By: Harold Barban Entered By: Worthy Keeler on 06/21/2019 08:28:02 GATSBY, BYMAN ([id number]) -------------------------------------------------------------------------------- Problem List Details Patient Name: [name] Date of Service: 06/20/2019 10:15 AM Medical Record Number: [id number] Patient Account Number: [id number] Date of Birth/Sex: [birth year] (68 y.o. 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, other site 04/08/2019 No Yes L03.317 Cellulitis of buttock 06/21/2018 No Yes G82.20 Paraplegia, unspecified 11/30/2017 No Yes S34.109S Unspecified injury to unspecified level of lumbar spinal cord, 11/30/2017 No Yes sequela I10 Essential (primary) hypertension 11/30/2017 No Yes Inactive Problems Resolved Problems Electronic Signature(s) Signed: 06/21/2019 5:47:36 PM By:  Worthy Keeler [name] Entered By: Worthy Keeler on 06/20/2019 10:28:12 MASSIAH, BULLS ([id number]) -------------------------------------------------------------------------------- Progress Note Details Patient Name: [name] Date of Service: 06/20/2019 10:15 AM Medical Record Number: [id number] Patient Account Number: [id number] Date of Birth/Sex: September 11, 1950 (68 y.o. 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 He states that there is no history of diabetes that he is aware of. He does have issues with his prostate and is currently receiving treatment for this by way of oral medication. With that being said I do not have a lot of details in that regard. Nonetheless the patient presents today as a result of having been referred to [place] by another provider initially home health was set to come out and take care of his wound although due to the fact that he apparently drives he's not able to receive home health. His wife is therefore trying to help take care of this wound within although they have been struggling with what exactly to do at this point. She states that she can do some things but she is definitely not a nurse and does have some issues with looking at blood. The good news is the wound does not appear to be too deep and is fairly superficial at this point. There is no slough noted there is some nonviable skin noted around the surface of the wound  and the perimeter at this point. The central portion of the wound appears to be very good with a dermal layer noted this does not appear to be again deep enough to extend it to subcutaneous tissue at this point. 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 In the pump he has Dilaudid and  baclofen. 12/07/17 on evaluation today patient presents for follow-up concerning his ongoing lower back thigh ulcer on the right. He states that he did not get the supplies ordered and therefore has not really been able to perform the dressing changes as directed exactly. His wife was able to get some Boarder Foam Dressing's from the drugstore and subsequently has been using hydrogel which did help to a degree in the wound does appear to be able smaller. There is actually more drainage this week noted than previous. 12/21/17 on evaluation today patient appears to be doing rather well in regard to his right gluteal ulcer. He has been tolerating the dressing changes without complication. There does not appear to be any evidence of infection at this point in time. Overall the wound does seem to be making some progress as far as the edges are concerned there's not as much in the way of overlapping of the external wound edges and he has a good epithelium to wound bed border for the most part. This however is not true right at the 12 o'clock location over the span of a little over a centimeters which actually will require debridement today to clean this away and hopefully allow it to continue to heal more appropriately. 12/28/17 on evaluation today patient appears to be doing rather well in regard to his ulcer in the left gluteal region. He's been tolerating the dressing changes without complication. Apparently he has had some difficulty getting his dressing material. Apparently there's been some confusion with ordering  we're gonna check into this. Nonetheless overall he's been showing signs of improvement which is good news. Debridement is not required today. 01/04/18 on evaluation today patient presents for follow-up concerning his right gluteal ulcer. He has been tolerating the dressing changes fairly well. On inspection today it appears he may actually have some maceration them concerned about the fact that he may be developing too much moisture in and around the wound bed which can cause delay in healing. With that being said he unfortunately really has not showed significant signs of improvement since last week's evaluation in fact this may even be just the little bit/slightly larger. Nonetheless he's been having a lot of discomfort I'm not sure this is even related to the wound as he has no pain when I'm to breeding or otherwise cleaning the wound during evaluation today. Nonetheless this is something that we did recommend he talked to his pain specialist concerning. 01/11/18 on evaluation today patient appears to be doing better in regard to his ulceration. He has been tolerating the dressing Colasanti, Dyon J. ([id number]) changes without complication. With that being said overall there's no evidence of infection which is good news. The only thing is he did receive the hatch affair blue classic versus the ready nonetheless I feel like this is perfectly fine and appears to have done well for him over the past week. 01/25/18 on evaluation today patient's wound actually appears to be a little bit larger than during the last evaluation. The good news is the majority of the wound edges actually appear to be fairly firmly attached to the wound bed unfortunately again we're not really making progress in regard to the size. Roughly the wound is about the same size as when I first saw him although again the wound margin/edges appear to be much better. 02/01/18 on evaluation today patient actually appears to be doing  very well in regard to his wound. Applying the Prisma dry does seem to be  better although he does still have issues with slow progression of the wound. There was a slight improvement compared to last week's measurements today. Nonetheless I have been considering other options as far as the possibility of Theraskin or even a snap vac. In general I'm not sure that the Theraskin due to location of the wound would be a very good idea. Nonetheless I do think that a snap vac could be a possibility for the patient and in fact I think this could even be an excellent way to manage the wound possibly seeing some improvement in a very rapid fashion here. Nonetheless this is something that we would need to get approved and I did have a lengthy conversation with the patient about this today. 02/08/18 on evaluation today patient appears to be doing a little better in regard to his ulcer. He has been tolerating the dressing changes without complication. Fortunately despite the fact that the wound is a little bit smaller it's not significantly so unfortunately. We have discussed the possibility of a snap vac we did check with insurance this is actually covered at this point. Fortunately there does not appear to be any sign of infection. Overall I'm fairly pleased with how things seem to be appearing at this point. 02/15/18 on evaluation today patient appears to be doing rather well in regard to his right gluteal ulcer. Unfortunately the snap vac did not stay in place with his sheer and friction this came loose and did not seem to maintain seal very well. He worked for about two days and it did seem to do very well during that time according to his wife but in general this does not seem to be something that's gonna be beneficial for him long-term. I do believe we need to go back to standard dressings to see if we can find something that will be of benefit. 03/02/18- He is here in follow up evaluation; there is minimal  change in the wound. He will continue with the same treatment plan, would consider changing to iodosrob/iodoflex if ulcer continues to to plateau. He will follow up next week 03/08/18 on evaluation today patient's wound actually appears to be about the same size as when I previously saw him several weeks back. Unfortunately he does have some slightly dark discoloration in the central portion of the wound which has me concerned about pressure injury. I do believe he may be sitting for too long a period of time in fact he tells me that "I probably sit for much too long". He does have some Slough noted on the surface of the wound and again as far as the size of the wound is concerned I'm really not seeing anything that seems to have improved significantly. 03/15/18 on evaluation today patient appears to be doing fairly well in regard to his ulcer. The wound measured pretty much about the same today compared to last week's evaluation when looking at his graph. With that being said the area of bruising/deep tissue injury that was noted last week I do not see at this point. He did get a new cushion fortunately this does seem to be have been of benefit in my pinion. It does appear that he's been off of this more which is good news as well I think that is definitely showing in the overall wound measurements. With that being said I do believe that he needs to continue to offload I don't think that the fact this is doing better should be or  is going to allow him to not have to offload and explain this to him as well. Overall he seems to be in agreement the plan I think he understands. The overall appearance of the wound bed is improved compared to last week I think the Iodoflex has been beneficial in that regard. 03/29/18 on evaluation today patient actually appears to be doing rather well in regard to his wound from the overall appearance standpoint he does have some granulation although there's some Slough on  the surface of the wound noted as well. With that being said he unfortunately has not improved in regard to the overall measurement of the wound in volume or in size. I did have a discussion with him very specifically about offloading today. He actually does work although he mainly is just sitting throughout the day. He tells me he offloads by "lifting himself up for 30 seconds off of his chair occasionally" purchase from [employer] which does seem to have helped. And he has a new cushion that he with that being said he's also able to stand some for a very short period of time but not significant enough I think to provide appropriate offloading. I think the biggest issue at this point with the wound and the fact is not healing as quickly as we would like is due to the fact that he is really not able to appropriately offload while at work. He states the beginning after his injury he actually had a bed at his job that he could lay on in order to offload and that does seem to have been of help back at that time. Nonetheless he had not done this in quite some time unfortunately. I think that could be helpful for him this is something I would like for him to look into. JAYSHUN, KOVACEVICH ([id number]) 04/05/18 on evaluation today patient actually presents for follow-up concerning his right gluteal ulcer. Again he really is not significantly improved even compared to last week. He has been tolerating the dressing changes without complication. With that being said fortunately there appears to be no evidence of infection at this time. He has been more proactive in trying to offload. 04/12/18 on evaluation today patient actually appears to be doing a little better in regard to his wound and the right gluteal fold region. He's been tolerating the dressing changes since removing the oasis without complication. However he was having a lot of burning initially with the oasis in place. He's unsure of  exactly why this was given so much discomfort but he assumes that it was the oasis itself causing the problem. Nonetheless this had to be removed after about three days in place although even those three days seem to have made a fairly good improvement in regard to the overall appearance of the wound bed. In fact is the first time that he's made any improvement from the standpoint of measurements in about six weeks. He continues to have no discomfort over the area of the wound itself which leads me to wonder why he was having the burning with the oasis when he does not even feel the actual debridement's themselves. I am somewhat perplexed by this. 04/19/18 on evaluation today patient's wound actually appears to be showing signs of epithelialization around the edge of the wound and in general actually appears to be doing better which is good news. He did have the same burning after about three days with applying the Endoform last week in the same fashion  that I would generally apply a skin substitute. This seems to indicate that it's not the oasis to cause the problem but potentially the moisture buildup that just causes things to burn or there may be some other reaction with the skin prep or Steri-Strips. Nonetheless I'm not sure that is gonna be able to tolerate any skin substitute for a long period of time. The good news is the wound actually appears to be doing better today compared to last week and does seem to finally be making some progress. 04/26/18 on evaluation today patient actually appears to be doing rather well in regard to his ulcer in the right gluteal fold. He has been tolerating the dressing changes without complication which is good news. The Endoform does seem to be helping although he was a little bit more macerated this week. This seems to be an ongoing issue with fluid control at this point. Nonetheless I think we may be able to add something like Drawtex to help control the  drainage. 05/03/18 on evaluation today patient appears to actually be doing better in regard to the overall appearance of his wound. He has been tolerating the dressing changes without complication. Fortunately there appears to be no evidence of infection at this time. I really feel like his wound has shown signs as of today of turning around last week I thought so as well and definitely he could be seen in this week's overall appearance and measurements. In general I'm very pleased with the fact that he finally seems to be making a steady but sure progress. The patient likewise is very pleased. 05/17/18 on evaluation today patient appears to be doing more poorly unfortunately in regard to his ulcer. He has been tolerating the dressing changes without complication. With that being said he tells me that in the past couple of days he and his wife have noticed that we did not seem to be doing quite as well is getting dark near the center. Subsequently upon evaluation today the wound actually does appear to be doing worse compared to previous. He has been tolerating the dressing changes otherwise and he states that he is not been sitting up anymore than he was in the past from what he tells me. Still he has continued to work he states "I'm tired of dealing with this and if I have to just go home and lay in the bed all the time that's what I'll do". Nonetheless I am concerned about the fact that this wound does appear to be deeper than what it was previous. 05/24/18 upon evaluation today patient actually presents after having been in the hospital due to what was presumed to be sepsis secondary to the wound infection. He had an elevated white blood cell count between 14 and 15. With that being said he does seem to be doing somewhat better now. His wound still is giving him some trouble nonetheless and he is obviously concerned about the fact likely talked about that this does seem to go more deeply than  previously noted. I did review his wound culture which showed evidence of Staphylococcus aureus him and group B strep. Nonetheless he is on antibiotics, Levaquin, for this. Subsequently I did review his intake summary from the hospital as well. I also did look at the CT of the lumbar spine with contrast that was performed which showed no bone destruction to suggest lumbar disguises/osteomyelitis or sacral osteomyelitis. There was no paraspinal abscess. Nonetheless it appears this may have been more  of just a soft tissue infection at this point which is good news. He still is nonetheless concerned about the wound which again I think is completely reasonable considering everything he's been through recently. 05/31/18 on evaluation today on evaluation today patient actually appears to be showing signs of his wound be a little bit deeper than what I would like to see. Fortunately he does not show any signs of significant infection although his temperature was 99 today he states he's been checking this at home and has not been elevated. Nonetheless with the undermining that I'm seeing at this point I am becoming more concerned about the wound I do think that offloading is a key factor here that is preventing the speedy recovery at this point. There does not appear to be any evidence of again over infection noted. He's been using Santyl currently. TAJAI, LENNARTZ ([id number]) 06/07/18 the patient presents today for follow-up evaluation regarding the left ulcer in the gluteal region. He has been tolerating the Wound VAC fairly well. He is obviously very frustrated with this he states that to mean is really getting in his way. There does not appear to be any evidence of infection at this time he does have a little bit of odor I do not necessarily associate this with infection just something that we sometimes notice with Wound VAC therapy. With that being said I can definitely catch a tone of  discontentment overall in the patient's demeanor today. This when he was previously in the hospital an CT scan was done of the lumbar region which did not reveal any signs of osteomyelitis. With that being said the pelvis in particular was not evaluated distinctly which means he could still have some osteonecrosis I. Nonetheless the Wound VAC was started on Thursday I do want to get this little bit more time before jumping to a CT scan of the pelvis although that is something that I might would recommend if were not see an improvement by that time. 06/14/18 on evaluation today patient actually appears to be doing about the same in regard to his right gluteal ulcer. Again he did have a CT scan of the lumbar spine unfortunately this did not include the pelvis. Nonetheless with the depth of the wound that I'm seeing today even despite the fact that I'm not seeing any evidence of overt cellulitis I believe there's a good chance that we may be dealing with osteomyelitis somewhere in the right Ischial region. No fevers, chills, nausea, or vomiting noted at this time. 06/21/18 on evaluation today patient actually appears to be doing about the same with regard to his wound. The tunnel at 6 o'clock really does not appear to be any deeper although it is a little bit wider. I think at this point you may want to start packing this with white phone. Unfortunately I have not got approval for the CT scan of the pelvis as of yet due to the fact that Medicare apparently has been denied it due to the diagnosis codes not being appropriate according to Medicare for the test requested. With that being said the patient cannot have an MRI and therefore this is the only option that we have as far as testing is concerned. The patient has had infection and was on antibiotics and been added code for cellulitis of the bottom to see if this will be appropriate for getting the test approved. Nonetheless I'm concerned about the  infection have been spread deeper into the Ischial region.  06/28/18 on evaluation today patient actually appears to be doing rather well all things considered in regard to the right gluteal ulcer. He has been tolerating the dressing changes without complication. With that being said the Wound VAC he states does have to be replaced almost every day or at least reinforced unfortunately. Patient actually has his CT scan later this morning we should have the results by tomorrow. 07/05/18 on evaluation today patient presents for follow-up concerning his right Ischial ulcer. He did see the surgeon Dr. Lysle Pearl last week. They were actually very happy with him and felt like he spent a tremendous amount of time with them as far as discussing his situation was concerned. In the end Dr. Lysle Pearl did contact me as well and determine that he would not recommend any surgical intervention at this point as he felt like it would not be in the patient's best interest based on what he was seeing. He recommended a referral to infectious disease. Subsequently this is something that Dr. Ines Bloomer office is working on setting up for the patient. As far as evaluation today is concerned the patient's wound actually appears to be worse at this point. I am concerned about how things are progressing and specifically about infection. I do not feel like it's the deeper but the area of depth is definitely widening which does have me concerned. No fevers, chills, nausea, or vomiting noted at this time. I think that we do need initiate antibiotic therapy the patient has an allow allergy to amoxicillin/penicillin he states that he gets a rash since childhood. Nonetheless she's never had the issues with Catholics or cephalosporins in general but he is aware of. 07/27/18 on evaluation today patient presents following admission to the hospital on 07/09/18. He was subsequently discharged on 07/20/18. On 07/15/18 the patient underwent irrigation  and debridement was soft tissue biopsy and bone biopsy as well as placement of a Wound VAC in the OR by Dr. Celine Ahr. During the hospital course the patient was placed on a Wound VAC and recommended follow up with surgery in three weeks actually with Dr. Delaine Lame who is infectious disease. The patient was on vancomycin during the hospital course. 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 He also had a bone culture which revealed evidence of methicillin-resistant staph aureus. He is updated CT scan 07/09/18 reveals that he had progression of the which was performed on wound to breakdown down to the trochanter where he actually had irregularities there as well suggestive of osteomyelitis. This was a change just since 9 December when we last performed a CT scan. Obviously this one had gone downhill quite significantly and rapidly. At this point upon evaluation I feel like in general the patient's wound seems to be doing fairly well all things considered upon my evaluation today. Obviously this is larger and deeper than what I previously evaluated but at the same time he seems to be making some progress as far as the appearance of the granulation tissue is concerned. I'm happy in that regard. No fevers, chills, nausea, or vomiting noted at this time. He is on IV vancomycin and Rocephin at the facility. He is currently in [facility]. 08/03/18 upon evaluation today patient's wound appears to be doing better in regard to the overall appearance at this point in time. Fortunately he's been tolerating the Wound VAC without complication and states that the facility has been taking YUREN, EBERTS. ([id number]) excellent care of the wound site. Overall  I see some Slough noted on the surface which I am going to attempt sharp debridement today of but nonetheless other than this I feel like he's making progress. 08/09/18 on evaluation today patient's wound appears to be  doing much better compared to even last week's evaluation. Do believe that the Wound VAC is been of great benefit for him. He has been tolerating the dressing changes that is the Wound VAC without any complication and he has excellent granulation noted currently. There is no need for sharp debridement at this point. 08/16/18 on evaluation today patient actually appears to be doing very well in regard to the wound in the right gluteal fold region. This is showing signs of progress and again appears to be very healthy which is excellent news. Fortunately there is no sign of active infection by way of odor or drainage at this point. Overall I'm very pleased with how things stand. He seems to be tolerating the Wound VAC without complication. 08/23/18 on evaluation today patient actually appears to be doing better in regard to his wound. He has been tolerating the Wound VAC without complication and in fact it has been collecting a significant amount of drainage which I think is good news especially considering how the wound appears. Fortunately there is no signs of infection at this time definitely nothing appears to be worse which is good news. He has not been started on the Bactrim and Flagyl that was recommended by Dr. Delaine Lame yet. I did actually contact her office this morning in order to check and see were things are that regard their gonna be calling me back. 08/30/18 on evaluation today patient actually appears to show signs of excellent improvement today compared to last evaluation. The undermining is getting much better the wound seems to be feeling quite nicely and I'm very pleased that the granulation in general. With that being said overall I feel like the patient has made excellent progress which is great news. No fevers, chills, nausea, or vomiting noted at this time. 09/06/18 on evaluation today patient actually appears to be doing rather well in regard to his right gluteal ulcer. This is  showing signs of improvement in overall I'm very pleased with how things seem to be progressing. The patient likewise is please. Overall I see no evidence of infection he is about to complete his oral antibiotic regimen which is the end of the antibiotics for him in just about three days. 09/13/18 on evaluation today patient's right Ischial ulcer appears to be showing signs of continued improvement which is excellent news. He's been tolerating the dressing changes without complication. Fortunately there's no signs of infection and the wound that seems to be doing very well. 09/28/18 on evaluation today patient appears to be doing rather well in regard to his right Ischial ulcer. He's been tolerating the Wound VAC without complication he knows there's much less drainage than there used to be this obviously is not a bad thing in my pinion. There's no evidence of infection despite the fact is but nothing about it now for several weeks. 10/04/18 on evaluation today patient appears to be doing better in regard to his right Ischial wound. He has been tolerating the Wound VAC without complication and I do believe that the silver nitrate last week was beneficial for him. Fortunately overall there's no evidence of active infection at this time which is great news. No fevers, chills, nausea, or vomiting noted at this time. 10/11/18 on evaluation today patient actually  appears to be doing rather well in regard to his Ischial ulcer. He's been tolerating the Wound VAC still without complication I feel like this is doing a good job. No fevers, chills, nausea, or vomiting noted at this time. 11/01/18 on evaluation today patient presents after having not been seen in our clinic for several weeks secondary to the fact that he was on evaluation today patient presents after having not been seen in our clinic for several weeks secondary to the fact that he was in a skilled nursing facility which was on lockdown currently  due to the covert 19 national emergency. Subsequently he was discharged from the facility on this past Friday and subsequently made an appointment to come in to see yesterday. Fortunately there's no signs of active infection at this time which is good news and overall he does seem to have made progress since I last saw. Overall I feel like things are progressing quite nicely. The patient is having no pain. 11/08/18 on evaluation today patient appears to be doing okay in regard to his right gluteal ulcer. He has been utilizing a Wound VAC home health this changing this at this point since he's home from the skilled nursing facility. Fortunately there's no signs of obvious active infection at this time. Unfortunately though there's no obvious active infection he is having some maceration and his wife states that when the sheets of the Wound VAC office on Sunday when it broke seal that he ended up having significant issues with some smell as well there concerned about the possibility of infection. Fortunately there's No fevers, chills, nausea, or vomiting noted at this time. TYMON, DETZLER ([id number]) 11/15/18 on evaluation today patient actually appears to be doing well in regard to his right gluteal ulcer. He has been tolerating the dressing changes without complication. Specifically the Wound VAC has been utilized up to this point. Fortunately there's no signs of infection and overall I feel like he has made progress even since last week when I last saw him. I'm actually fairly happy with the overall appearance although he does seem to have somewhat of a hyper granular overgrowth in the central portion of the wound which I think may require some sharp debridement to try flatness out possibly utilizing chemical cauterization following. 11/23/18 on evaluation today patient actually appears to be doing very well in regard to his sacral ulcer. He seems to be showing signs of improvement with good  granulation. With that being said he still has the small area of hyper granulation right in the central portion of the wound which I'm gonna likely utilize silver nitrate on today. Subsequently he also keeps having a leak at the 6 o'clock location which is unfortunate we may be able to help out with some suggestions to try to prevent this going forward. Fortunately there's no signs of active infection at this time. 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 He's been tolerating the dressing changes without complication. Fortunately there's no signs of active infection at this time. I've been rather pleased with how things have progressed there still some evidence of pressure getting to the area with some redness right around the immediate wound opening. Nonetheless other than this I'm not seeing any significant complications or issues the wound is somewhat hyper granular. Upon discussing with the patient and his wife today I'm not sure that the wound is being packed  to the base with the foam at this point. And if it's not been packed fully that may be part of the reason why is not seen as much improvement as far as the granulation from the base out. Again we do not want pack too tightly but we need some of the firm to get to the base of the wound. I discussed this with patient and his wife today. 12/06/18 on evaluation today patient appears to be doing well in regard to his right gluteal pressure ulcer. He's been tolerating the dressing changes without complication. Fortunately there's no signs of active infection. He still has some hyper granular tissue and I do think it would be appropriate to continue with the chemical cauterization as of today. 12/16/18 on evaluation today patient actually appears to be doing okay in regard to his right gluteal ulcer. He is been tolerating the dressing changes without  complication including the Wound VAC. Overall I feel like nothing seems to be worsening I do feel like that the hyper granulation buds in the central portion of the wound have improved to some degree with the silver nitrate. We will have to see how things continue to progress. 12/20/18 on evaluation today patient actually appears to be doing much worse in my pinion even compared to last week's evaluation. Unfortunately as opposed to showing any signs of improvement the areas of hyper granular tissue in the central portion of the wound seem to be getting worse. Subsequently the wound bed itself also seems to be getting deeper even compared to last week which is both unusual as well as concerning since prior he had been shown signs of improvement. Nonetheless I think that the issue could be that he's actually having some difficulty in issues with a deeper infection. There's no external signs of infection but nonetheless I am more worried about the internal, osteomyelitis, that could be restarting. He has not been on antibiotics for some time at this point. I think that it may be a good idea to go ahead and started back on an antibiotic therapy while we wait to see what the testing shows. 12/27/18 on evaluation today patient presents for follow-up concerning his left gluteal fold wound. Fortunately he appears to be doing well today. I did review the CT scan which was negative for any signs of osteomyelitis or acute abnormality this is excellent news. Overall I feel like the surface of the wound bed appears to be doing significantly better today compared to previously noted findings. There does not appear any signs of infection nor does he have any pain at this time. 01/03/19 on evaluation today patient actually appears to be doing quite well in regard to his ulcer. Post debridement last week he really did not have too much bleeding which is good news. Fortunately today this seems to be doing some better but  we still has some of the hyper granular tissue noted in the base of the wound which is gonna require sharp debridement today as well. Overall I'm pleased with how things seem to be progressing since we switched away from the Wound VAC I think he is making some progress. 01/10/19 on evaluation today patient appears to be doing better in regard to his right gluteal fold ulcer. He has been tolerating the dressing changes without complication. The debridement to seem to be helping with current away some of the poor hyper granular tissue bugs throughout the region of his gluteal fold wound. He's been tolerating the dressing changes  otherwise without complication which is great news. No fevers, chills, nausea, or vomiting noted at this time. 01/17/19 on evaluation today patient actually appears to be doing excellent in regard to his wound. He's been tolerating the dressing changes without complication. Fortunately there is no signs of active infection at this time which is great news. No fevers, chills, nausea, or vomiting noted at this time. WYATTE, ORECCHIO ([id number]) 01/24/19 on evaluation today patient actually appears to be doing quite well with regard to his ulcer. He has been tolerating the dressing changes without complication. Fortunately there's no signs of active infection at this time. Overall been very pleased with the progress that he seems to be making currently. 01/31/19-Patient returns at 1 week with apparent similarity in dimensions to the wound, with no signs of infection, he has been changing dressings twice a day 02/08/19 upon evaluation today patient actually appears to be doing well with regard to his right Ischial ulcer. The wound is not appear to be quite as deep and seems to be making progress which is good news. With that being said I'm still reluctant to go back to the Wound VAC at this point. He's been having to change the dressings twice a day which is a little bit much in  my pinion from the wound care supplies standpoint. I think that possibly attempting to utilize extras orbit may be beneficial this may also help to prevent any additional breakdown secondary to fluid retention in the wound itself. The patient is in agreement with giving this a try. 02/15/19 on evaluation today patient actually appears to be doing decently well with regard to his ulcer in the right to gluteal fold location. He's been tolerating the dressing changes without complication. Fortunately there is no signs of active infection at this time. He is able to keep the current dressing in place more effectively for a day at a time whereas before he was having a changes to to three times a day. The actions or has been helpful in this regard. Fortunately there's no signs of anything getting worse and I do feel like he showing signs of good improvement with regard to the wound bed status. 02/22/2019 patient appears to be doing very well today with regard to his ulcer in the gluteal fold. Fortunately there is no signs of active infection and he has been tolerating the dressing changes without any complication. Overall extremely pleased with how things seem to be progressing. He has much less of the hyper granular projections within the wound these have slowly been debrided away and he seems to be doing well. The wound bed is more uniform. 03/01/19 on evaluation today patient appears to be doing unfortunately about the same in regard to his gluteal ulcer. He's been tolerating the dressing changes without complication. Fortunately there's no signs of active infection at this time. With that being said he continues to develop these hyper granular projections which I'm unsure of exactly what they are and why they are rising. Nonetheless I explained to the patient that I do believe it would be a good idea for [place] to stand a biopsy sample for pathology to see if that can shed any light on what exactly may be  going on here. Fortunately I do not see any obvious signs of infection. With that being said the patient has had a little bit more drainage this week apparently compared to last week. 03/08/2019 on evaluation today patient actually appears to look somewhat better with regard  to the appearance of his wound bed at this time. This is good news. Overall I am very pleased with how things seem to have progressed just in the past week with a switch to the [facility] dressing. I think that has been beneficial for him. With that being said at this time the patient is concerned about his biopsy that I sent off last week unfortunately I do not have that report as of yet. Nonetheless we have called to obtain this and hopefully will hear back from the lab later this morning. 03/15/19 on evaluation today patient's wound actually appears to be doing okay today with regard to the overall appearance of the wound bed. He has been tolerating the dressing changes without complication he still has hyper granular tissue noted but fortunately that seems to be minimal at this point compared to some of what we've seen in the past. Nonetheless I do think that he is still having some issues currently with some of this type of granulation the biopsy and since all showed nothing more than just evidence of granulation tissue. Therefore there really is nothing different to initiate or do at this point. 03/24/19 on evaluation today patient appears to be doing a little better with regard to his ulcer. He's been tolerating the dressing changes without complication. Fortunately there is no signs of active infection at this time. No fevers, chills, nausea, or vomiting noted at this time. I'm overall pleased with how things seem to be progressing. 03/29/2019 on evaluation today patient appears to be doing about the same in regard to his ulcer in the right gluteal fold. Unfortunately he is not seeming to make a lot of progress and the  wound is somewhat stalled. There is no signs of active infection externally but I am concerned about the possibility of infection continuing in the ischial location which previously he did have infection noted. Again is not able to have an MRI so probably our best option for testing for this would be a triple phase bone scan which will detect subtle changes in the bone more so than plain film x-rays. In the past they really have not been beneficial and in fact the CT scans even have been somewhat questionable at times. Nonetheless there is no signs of systemic infection which is at least good news but again his wound is not healing at all on the predicted schedule. 04/05/19 on evaluation today patient appears to be doing well all things considering with regard to his wound and the right gluteal fold. He actually has his triple bone scan scheduled for sometime in the next couple of days. With that being said I've also been looking to other possibilities of what could be causing hyper granular tissue were looking into the bone scan again for evaluating for the risk or possibility of infection deeper and I'm also gonna go ahead and see about obtaining a deep tissue Mccully, Jovin J. ([id number]) culture today to send and see if there's any evidence of infection noted on culture. He's in agreement with that plan. 04/12/2019 on evaluation today patient presents for reevaluation here in our clinic concerning ongoing issues with his right gluteal fold ulcer. 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 It was discussed with him at that point that I think it would be appropriate for him to proceed with hyperbaric oxygen therapy especially in light of  the fact that he is previously been on IV antibiotics at the beginning of the year for close to 3 months followed by several weeks of oral  antibiotics that was all prescribed by infectious disease. He had surgical debridement around Christmas December 2019 due to an abscess and osteomyelitis of the ischial bone. Unfortunately this has not really proceeded as well as we would have liked and again we did a CT scan even a couple months ago as he cannot have an MRI secondary to having issues with both a pain pump as well as a spinal cord stimulator which prevent him from going into an MRI machine. With that being said there were chronic changes noted at the ischial tuberosity which had progressed since December 2019 there was no evidence of fluid collection on that initial CT scan. With that being said that was on January 21, 2019. I am not sure that it was a sensitive enough test as compared to an MRI and then subsequently I ordered a bone scan for him which was actually completed on 03/29/2019 and this revealed that he does indeed have positive osteomyelitis involving the right ischial tuberosity. This is adjacent to the ulcer and I think is the reason that his ulcer is not healing. Subsequently I am in a place him back on oral antibiotics today unfortunately his wound culture showed just mixed gram-negative flora with no specific findings of a predominant organism. Nonetheless being with the location I think a good broad-spectrum antibiotic for gram-negative's is a good choice at this point he is previously taken Levaquin without significant issues and I think that is an appropriate antibiotic for him at this time. 04/19/2019 on evaluation today patient actually appears to be doing fairly well with regard to his wound. He has been tolerating the dressing changes without complication. Fortunately there is no signs of active infection and he has been taking the oral antibiotics at this time. Subsequently we did make a referral to infectious disease although Dr. Steva Ready wants the patient to be seen by general surgery first in order apparently  to see if there is anything from a surgical standpoint that should be done prior to initiation of IV antibiotic therapy. Again the patient is okay with actually seeing Dr. Celine Ahr whom he has seen before we will make that referral. 04/26/2019 on evaluation today patient actually appears to be doing well with regard to his wound. He has been tolerating the dressing changes without complication. Fortunately there is no signs of active infection at this time. I do believe that the hyperbaric oxygen therapy along with the antibiotics which I prescribed at this point have been doing well for him. With that being said he has not seen Dr. Celine Ahr the surgeon yet in fact they have not been contacted for scheduling an appointment as of yet. Subsequently the patient is not on antibiotics currently by IV Dr. Steva Ready did want him to see Dr. Celine Ahr first which we are working on trying to get scheduled. We again give the information to the patient today for Dr. Celine Ahr and her number as far as contacting their office to see about getting something scheduled. Again were looking for whether or not they recommend any surgical intervention. 05/03/2019 on evaluation today patient appears to be doing about the same with regard to his wound there may be a little bit of filling in in regard to the base of the wound but still he has quite a bit of hyper granular tissue that I  am seeing today. I believe that he may may need a more aggressive sharp debridement possibly even by the surgeon or under anesthesia in order to clear away some of the hyper granular material in order to help allow for appropriate granulation to fill in. We have made a referral to Dr. Celine Ahr unfortunately it sounds as if they did not receive the referral we contacted them today they should be get in touch with the patient. Depending on how things show from that standpoint the patient may need to see Dr. Ardyth Gal who is the infectious disease  specialist although he really does not want a PICC line again. No fevers, chills, nausea, vomiting, or diarrhea. He is tolerating hyperbaric oxygen therapy very well. 05/12/2019 on evaluation today patient presents for follow-up visit concerning his right gluteal fold ulcer. He did see Dr. Celine Ahr the surgeon who previously evaluated his wound and she actually felt like he was doing quite well with regard to the wound based on what she was seen. He does seem to be responding to some degree with the oral antibiotics along with hyperbaric oxygen therapy at this point she did not see any evidence or need for further surgical intervention at this point. She recommended deferring additional or ongoing antibiotic therapy to Dr. Steva Ready at her discretion. Fortunately there is no signs of active systemic infection. No fevers, chills, nausea, vomiting, or diarrhea. 10/29; patient I am seeing really for the first time today in terms of his wound. 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 He is being treated with hyperbaric oxygen for underlying osteomyelitis most recently documented by a three-phase bone scan. He has been on Levaquin for roughly a month and he is out of these and asked me for a refill. Most recent cultures were negative although I do not think these were bone cultures. He has a very irregular surface to this wound which is almost nodular in texture. It undermines superiorly with the same nodular hyper granulated surface. I see that he was referred to general surgery for an operative debridement although the surgeon did not agree with this I think that would have been the proper course of action in this. He has been using Hydrofera Blue. He is supposed to start a wound VAC tomorrow which is actually a reapplication of wound VAC. I think mostly last time they had trouble keeping the seal in place I hope we have better look at this this time. DEKLIN, KILMARTIN  ([id number]) 05/24/2019 on evaluation today I did review patient's note from Dr. Dellia Nims where he was seen last week when I was out of the office. Subsequently it does appear that Dr. Dellia Nims was in agreement that the patient really needed debridement to clear away some of the nodular tissue in the base of the wound. The good news is he has the wound VAC initiated and some of this tissue is already starting to breakdown under the treatment of the wound VAC again because it is not really viable. Nonetheless I do believe that we can likely perform some debridement today to clear this away and hopefully the continuation of the wound VAC along with hyperbarics and antibiotic therapy will be beneficial in stopping this from reoccurring. If we get better control in the wound bed in a good spot I think we can definitely use the PriMatrix which we actually did get approval for. 05/31/2019 on evaluation today patient appears to be doing actually pretty well at this point  with regard to his wound. He has been tolerating the dressing changes which is good news. Fortunately there is no signs of infection. He still has some of the hyper granular nodules noted we have not really cleared all these away and I think we can try to do a little bit more that today. I did do quite a bit last week I am hoping to get down to the base of the wound so though actually be able to heal more appropriately. I think the wound VAC is doing a good job right now with controlling the drainage and overall the patient is happy with that. 06/07/2019 upon evaluation today patient appears to be doing quite well compared to last week with regard to the hyper granulation in the base of the wound. He has been tolerating the dressing changes without complication. Fortunately there is no signs of active infection at this time. With that being said we have not had any recent measures as far as blood work is concerned I think we may want to check a  few things including a CBC, sed rate, and C-reactive protein. Patient is in agreement with that we can try to get that scheduled for him for this Friday. 06/13/2019 on evaluation today patient actually appears to be doing much better at this point based on what I am seeing with regard to his wound. He has much less in the way of hyper granular tissue which is good news and a lot more healing that is taken place since I last saw him as far as the amount of space within the wound itself. With that being said he is nearing the completion of the initial 40 treatments for hyperbaric oxygen therapy I think this is something I would recommend extending as he does seem to be benefiting at this time. Fortunately there is no evidence of active systemic infection at this point nor even local infection for that matter. 06/20/2019 upon evaluation today patient actually appears to be doing well with regard to his wound. In fact this appears to be doing much better today compared to what it was previous. I been extremely pleased with the progress that he is made. In fact the tunnel is much less deep today than what it was in the past and I am seeing excellent signs of improvement. Overall I am happy with how things are going. I did review his white blood cell count which revealed everything to be okay. The one abnormal finding on his CBC was a hemoglobin of 12.7. Subsequently I did also review his sed rate and C-reactive protein. His sed rate measured in at normal level measuring at 26. The C-reactive protein however was elevated today and an abnormal range indicating inflammation. Still I feel like he is trending in a good direction at this time. He is going to need a refill of the Levaquin he tells me at this point. Patient History Information obtained from Patient. Family History Hypertension - Father, Stroke - Mother, No family history of Cancer, Diabetes, Heart Disease, Kidney Disease, Lung Disease,  Seizures, Thyroid Problems, Tuberculosis. Social History Never smoker, Marital Status - Married, Alcohol Use - Never, Drug Use - No History, Caffeine Use - Daily. Medical History Eyes Patient has history of Cataracts - both removed Denies history of Glaucoma, Optic Neuritis Ear/Nose/Mouth/Throat Denies history of Chronic sinus problems/congestion, Middle ear problems Hematologic/Lymphatic Denies history of Anemia, Hemophilia, Human Immunodeficiency Virus, Lymphedema Respiratory Saraceno, Giulian J. ([id number]) Denies history of Aspiration, Asthma, Chronic Obstructive  Pulmonary Disease (COPD), Pneumothorax, Sleep Apnea, Tuberculosis Cardiovascular Patient has history of Hypertension - takes medication Denies history of Angina, Arrhythmia, Congestive Heart Failure, Coronary Artery Disease, Deep Vein Thrombosis, Hypotension, Myocardial Infarction, Peripheral Arterial Disease, Peripheral Venous Disease, Phlebitis, Vasculitis Gastrointestinal Denies history of Cirrhosis , Colitis, Crohn s, Hepatitis A, Hepatitis B, Hepatitis C Endocrine Denies history of Type I Diabetes, Type II Diabetes Genitourinary Denies history of End Stage Renal Disease Immunological Denies history of Lupus Erythematosus, Raynaud s, Scleroderma Integumentary (Skin) Denies history of History of Burn, History of pressure wounds Musculoskeletal Denies history of Gout, Rheumatoid Arthritis, Osteoarthritis, Osteomyelitis Neurologic Patient has history of Paraplegia - waist down Denies history of Dementia, Neuropathy, Quadriplegia, Seizure Disorder Oncologic Denies history of Received Chemotherapy, Received Radiation Psychiatric Denies history of Anorexia/bulimia, Confinement Anxiety Medical And Surgical History Notes Oncologic Prostate cancer- currently treated with horomone therapy Review of Systems (ROS) Constitutional Symptoms (General Health) Denies complaints or symptoms of Fatigue, Fever, Chills, Marked  Weight Change. Respiratory Denies complaints or symptoms of Chronic or frequent coughs, Shortness of Breath. Cardiovascular Denies complaints or symptoms of Chest pain, LE edema. Psychiatric Denies complaints or symptoms of Anxiety, Claustrophobia. Objective Constitutional Well-nourished and well-hydrated in no acute distress. Vitals Time Taken: 10:15 AM, Height: 73 in, Weight: 210 lbs, BMI: 27.7, Temperature: 98.6 F, Pulse: 60 bpm, Respiratory Rate: 16 breaths/min, Blood Pressure: 132/74 mmHg. Respiratory normal breathing without difficulty. clear to auscultation bilaterally. HENDRY, BOSCHEN ([id number]) Cardiovascular regular rate and rhythm with normal S1, S2. Psychiatric this patient is able to make decisions and demonstrates good insight into disease process. Alert and Oriented x 3. pleasant and cooperative. General Notes: Patient's wound bed currently did not require any sharp debridement although I did actually perform chemical cauterization with silver nitrate today which has seemed to be beneficial for him I definitely think the wound VAC is also beneficial. Overall been extremely pleased with how things seem to be progressing at this point. He is doing significantly better than what he was in the past. The patient likewise is extremely happy as well with what is going on at this time. Integumentary (Hair, Skin) Wound #1 status is Open. Original cause of wound was Pressure Injury. The wound is located on the Right Gluteal fold. The wound measures 3cm length x 2.5cm width x 3cm depth; 5.89cm^2 area and 17.671cm^3 volume. There is muscle and Fat Layer (Subcutaneous Tissue) Exposed exposed. There is no undermining noted, however, there is tunneling at 12:00 with a maximum distance of 4.5cm. There is a large amount of serous drainage noted. The wound margin is epibole. There is large (67-100%) pink, hyper - granulation within the wound bed. There is a small (1-33%) amount of  necrotic tissue within the wound bed including Adherent Slough. 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 - in office Cleanse wound with mild soap and water Skin Barriers/Peri-Wound Care: Skin Prep Primary Wound Dressing: Wound #1 Right Gluteal fold: Silver Collagen - under NPWT Other: - in clinic - silvercel, xtrasorb, tegaderm Dressing Change Frequency: Wound #1 Right Gluteal fold: Change Dressing Monday, Wednesday, Friday Follow-up Appointments: Wound #1 Right Gluteal fold: ETHYN, WENDEROTH. ([id number]) Return Appointment in 2 weeks. Off-Loading: Wound #1 Right Gluteal fold: Turn and reposition every 2 hours Home Health: Wound #  1 Right Gluteal fold: [place] Visits - Labs drawn on Friday, December 11th to include: CBC, Sed Rate, and C-Reactive Protein Home Health Nurse may visit PRN to address patient s wound care needs. FACE TO FACE ENCOUNTER: MEDICARE and MEDICAID PATIENTS: I certify that this patient is under my care and that I had a face-to-face encounter that meets the physician face-to-face encounter requirements with this patient on this date. The encounter with the patient was in whole or in part for the following MEDICAL CONDITION: (primary reason for [facility]) MEDICAL NECESSITY: I certify, that based on my findings, NURSING services are a medically necessary home health service. HOME BOUND STATUS: I certify that my clinical findings support that this patient is homebound (i.e., Due to illness or injury, pt requires aid of supportive devices such as crutches, cane, wheelchairs, walkers, the use of special transportation or the assistance of  another person to leave their place of residence. There is a normal inability to leave the home and doing so requires considerable and taxing effort. Other absences are for medical reasons / religious services and are infrequent or of short duration when for other reasons). If current dressing causes regression in wound condition, may D/C ordered dressing product/s and apply Normal Saline Moist Dressing daily until next [place] / Other MD appointment. [place] of regression in wound condition at [phone]. Please direct any NON-WOUND related issues/requests for orders to patient's Primary Care Physician Negative Pressure Wound Therapy: Wound #1 Right Gluteal fold: Wound VAC settings at 125/130 mmHg continuous pressure. Use BLACK/GREEN foam to wound cavity. Use WHITE foam to fill any tunnel/s and/or undermining. Change VAC dressing 3 X WEEK. Change canister as indicated when full. Nurse may titrate settings and frequency of dressing changes as clinically indicated. Home Health Nurse may d/c VAC for s/s of increased infection, significant wound regression, or uncontrolled drainage. [place] at [phone]. Apply contact layer over base of wound. - apply prisma/silver collagen to the base of the wound Number of foam/gauze pieces used in the dressing = Hyperbaric Oxygen Therapy: Wound #1 Right Gluteal fold: Evaluate for HBO Therapy Indication: - Right ischial osteomyelitis If appropriate for treatment, begin HBOT per protocol: 2.0 ATA for 90 Minutes without Air Breaks One treatment per day (delivered Monday through Friday unless otherwise specified in Special Instructions below): Total # of Treatments: - 60 The following medication(s) was prescribed: Levaquin oral 500 mg tablet 1 1 tablet oral taken daily for 14 days. Do not take iron with this medication starting 06/21/2019 1. I would recommend currently that we continue with the wound VAC.  I did perform chemical cauterization with silver nitrate utilizing 3 sticks of silver nitrate today which the patient tolerated having no pain. Subsequently I think that the wound VAC is helping to granulate this wound in and I am very pleased with the progress at this time. 2. I am also going to suggest that we continue currently with the hyperbaric oxygen therapy which I do believe has been extremely beneficial for him up to this point. I did extend him for 20 additional visits this will make a total of 60 visits at this point after obtaining approval from insurance. 3. I am also can recommend continued offloading which I think is very beneficial for him as well. 4. Also did go ahead and place an order for his blood work to be redrawn on Friday, December 11 this will be a CBC, sed rate, and  C-reactive protein and we will see how things stand at follow-up. We will see patient back for reevaluation in 2 weeks here in the clinic. If anything worsens or changes patient will contact our office for additional recommendations. ANCLE, MORELAN ([id number]) Electronic Signature(s) Signed: 06/21/2019 5:47:36 PM By: Worthy Keeler [name] Entered By: Worthy Keeler on 06/21/2019 08:28:14 GABIN, SCHWIETERMAN ([id number]) -------------------------------------------------------------------------------- ROS/PFSH Details Patient Name: [name] Date of Service: 06/20/2019 10:15 AM Medical Record Number: [id number] Patient Account Number: [id number] Date of Birth/Sex: 03-10-1951 (68 y.o. M) Treating RN: Harold Barban Primary Care Provider: [name] Other Clinician: Referring Provider: [name] Treating Provider/Extender: [name], Jermane Brayboy Weeks in Treatment: 53 Information Obtained From Patient Constitutional Symptoms (General Health) Complaints and Symptoms: Negative for: Fatigue; Fever; Chills; Marked Weight Change Respiratory Complaints and Symptoms: Negative for:  Chronic or frequent coughs; Shortness of Breath Medical History: Negative for: Aspiration; Asthma; Chronic Obstructive Pulmonary Disease (COPD); Pneumothorax; Sleep Apnea; Tuberculosis Cardiovascular Complaints and Symptoms: Negative for: Chest pain; LE edema Medical History: Positive for: Hypertension - takes medication Negative for: Angina; Arrhythmia; Congestive Heart Failure; Coronary Artery Disease; Deep Vein Thrombosis; Hypotension; Myocardial Infarction; Peripheral Arterial Disease; Peripheral Venous Disease; Phlebitis; Vasculitis Psychiatric Complaints and Symptoms: Negative for: Anxiety; Claustrophobia Medical History: Negative for: Anorexia/bulimia; Confinement Anxiety Eyes Medical History: Positive for: Cataracts - both removed Negative for: Glaucoma; Optic Neuritis Ear/Nose/Mouth/Throat Medical History: Negative for: Chronic sinus problems/congestion; Middle ear problems Hematologic/Lymphatic Medical History: Negative for: Anemia; Hemophilia; Human Immunodeficiency Virus; Lymphedema OAKLEN, GALLIHUGH. ([id number]) Gastrointestinal Medical History: Negative for: Cirrhosis ; Colitis; Crohnos; Hepatitis A; Hepatitis B; Hepatitis C Endocrine Medical History: Negative for: Type I Diabetes; Type II Diabetes Genitourinary Medical History: Negative for: End Stage Renal Disease Immunological Medical History: Negative for: Lupus Erythematosus; Raynaudos; Scleroderma Integumentary (Skin) Medical History: Negative for: History of Burn; History of pressure wounds Musculoskeletal Medical History: Negative for: Gout; Rheumatoid Arthritis; Osteoarthritis; Osteomyelitis Neurologic Medical History: Positive for: Paraplegia - waist down Negative for: Dementia; Neuropathy; Quadriplegia; Seizure Disorder Oncologic Medical History: Negative for: Received Chemotherapy; Received Radiation Past Medical History Notes: Prostate cancer- currently treated with horomone  therapy HBO Extended History Items Eyes: Cataracts Immunizations Pneumococcal Vaccine: Received Pneumococcal Vaccination: No Implantable Devices No devices added Family and Social History Cancer: No; Diabetes: No; Heart Disease: No; Hypertension: Yes - Father; Kidney Disease: No; Lung Disease: No; Seizures: No; Stroke: Yes - Mother; Thyroid Problems: No; Tuberculosis: No; Never smoker; Marital Status - Married; Alcohol Use: Never; Drug Use: No History; Caffeine Use: Daily JULLIEN, FIELDING ([id number]) Physician Affirmation I have reviewed and agree with the above information. Electronic Signature(s) Signed: 06/21/2019 5:47:36 PM By: Worthy Keeler [name] Signed: 06/22/2019 4:10:47 PM By: Harold Barban Entered By: Worthy Keeler on 06/20/2019 13:29:45 TAAHIR, MICHIELS ([id number]) -------------------------------------------------------------------------------- SuperBill Details Patient Name: [name] Date of Service: 06/20/2019 Medical Record Number: [id number] Patient Account Number: [id number] Date of Birth/Sex: [birth year] (68 y.o. 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, sequela I10 Essential (primary) hypertension Facility Procedures CPT4 Code: [id number] Description: 99213 - WOUND CARE VISIT-LEV 3 EST PT Modifier: Quantity: 1 CPT4 Code: [id number] Description: [id number] - CHEM CAUT GRANULATION TISS ICD-10 Diagnosis  Description L89.314 Pressure ulcer of right buttock, stage 4 Modifier: Quantity: 1 Physician Procedures CPT4 Code:  [id number] Description: [id number] - WC PHYS LEVEL 4 - EST PT ICD-10 Diagnosis Description L89.314 Pressure ulcer of right buttock, stage 4 M86.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

## 2019-06-22 NOTE — Progress Notes (Signed)
DEMAREO, DAVISON ([id number]) Visit Report for 06/22/2019 Arrival Information Details Patient Name: DEAMONTAE, ROUSER Date of Service: 06/22/2019 8:00 AM Medical Record Number: [id number] Patient Account Number: [id number] Date of Birth/Sex: June 06, 1951 (68 y.o. M) Treating RN: Harold Barban Primary Care Deyna Carbon: [name] Other Clinician: [name] Referring Ariyon Gerstenberger: [name] Treating Lanayah Gartley/Extender: [name] in Treatment: 30 Visit Information History Since Last Visit Added or deleted any medications: No Patient Arrived: Wheel Chair Any new allergies or adverse reactions: No Arrival Time: 08:00 Had a fall or experienced change in No Accompanied By: wife activities of daily living that may affect Transfer Assistance: Harrel Lemon Lift risk of falls: Patient Identification Verified: Yes Signs or symptoms of abuse/neglect since last visito No Secondary Verification Process Completed: Yes Hospitalized since last visit: No Patient Requires Transmission-Based No Implantable device outside of the clinic excluding No Precautions: cellular tissue based products placed in the center Patient Has Alerts: Yes since last visit: Patient Alerts: NOT Has Dressing in Place as Prescribed: Yes Diabetic Pain Present Now: No Electronic Signature(s) Signed: 06/22/2019 10:23:01 AM By: Lorine Bears RCP, RRT, CHT Entered By: Lorine Bears on 06/22/2019 08:41:59 ROUX, GIESBRECHT ([id number]) -------------------------------------------------------------------------------- Encounter Discharge Information Details Patient Name: [name] Date of Service: 06/22/2019 8:00 AM Medical Record Number: [id number] Patient Account Number: [id number] Date of Birth/Sex: 1950/10/20 (68 y.o. M) Treating RN: Harold Barban Primary Care Mosie Angus: [name] Other Clinician: [name] Referring Zyanne Schumm: [name] Treating Finnick Orosz/Extender: [name] in Treatment: 80 Encounter Discharge Information Items Discharge Condition: Stable Ambulatory Status: Wheelchair Discharge Destination: Home Transportation: Private Auto Accompanied By: self Schedule Follow-up Appointment: Yes Clinical Summary of Care: Notes Patient has an HBO treatment scheduled on 06/23/2019 at 08:00 am. Electronic Signature(s) Signed: 06/22/2019 10:23:01 AM By: Lorine Bears RCP, RRT, CHT Entered By: Lorine Bears on 06/22/2019 10:22:37 JONOTHON, BRUNELLE ([id number]) -------------------------------------------------------------------------------- Vitals Details Patient Name: [name] Date of Service: 06/22/2019 8:00 AM Medical Record Number: [id number] Patient Account Number: [id number] Date of Birth/Sex: 25-Jun-1951 (68 y.o. M) Treating RN: Harold Barban Primary Care Yvonne [name]: [name] Other Clinician: [name] Referring Ragen Laver: [name] Treating Claudius Mich/Extender: [name] in Treatment: 53 Vital Signs Time Taken: 08:14 Temperature (F): 98.6 Height (in): 73 Pulse (bpm): 72 Weight (lbs): 210 Respiratory Rate (breaths/min): 16 Body Mass Index (BMI): 27.7 Blood Pressure (mmHg): 118/76 Reference Range: 80 - 120 mg / dl Electronic Signature(s) Signed: 06/22/2019 10:23:01 AM By: Lorine Bears RCP, RRT, CHT Entered By: Lorine Bears on 06/22/2019 09:04:05

## 2019-06-22 NOTE — Progress Notes (Signed)
ALEXIEL, SIDDALL ([id number]) Visit Report for 06/20/2019 Problem List Details Patient Name: [name], [name] Date of Service: 06/20/2019 8:00 AM Medical Record Number: [id number] Patient Account Number: [id number] Date of Birth/Sex: 05/24/1951 (68 y.o. M) Treating RN: Harold Barban Primary Care Provider: [name] Other Clinician: [name] Referring Provider: [name] Treating Provider/Extender: [name], Dearion Huot Weeks in Treatment: 83 Active Problems ICD-10 Evaluated Encounter Code Description Active Date Today Diagnosis L89.314 Pressure ulcer of right buttock, stage 4 11/30/2017 No Yes M86.68 Other chronic osteomyelitis, other site 04/08/2019 No Yes L03.317 Cellulitis of buttock 06/21/2018 No Yes G82.20 Paraplegia, unspecified 11/30/2017 No Yes S34.109S Unspecified injury to unspecified level of lumbar spinal cord, 11/30/2017 No Yes sequela I10 Essential (primary) hypertension 11/30/2017 No Yes Inactive Problems Resolved Problems Electronic Signature(s) Signed: 06/21/2019 5:47:36 PM By: Worthy Keeler [name] Entered By: Worthy Keeler on 06/21/2019 17:46:00 [name] ([id number]) -------------------------------------------------------------------------------- SuperBill Details Patient Name: [name] Date of Service: 06/20/2019 Medical Record Number: [id number] Patient Account Number: [id number] Date of Birth/Sex: [birth year] (68 y.o. 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, sequela I10  Essential (primary) hypertension Facility Procedures CPT4 Code: [id number] Description: (Facility Use Only) HBOT, full body chamber, 67min Modifier: Quantity: 4 Physician Procedures CPT4 Code: [id number] Description: [id number] - WC PHYS HYPERBARIC OXYGEN THERAPY ICD-10 Diagnosis Description M86.68 Other chronic osteomyelitis, other site L89.314 Pressure ulcer of right buttock, stage 4 L03.317 Cellulitis of buttock G82.20 Paraplegia, unspecified Modifier: Quantity: 1 Electronic Signature(s) Signed: 06/21/2019 5:47:36 PM By: Worthy Keeler [name] Previous Signature: 06/20/2019 12:35:11 PM Version By: Lorine Bears RCP, RRT, CHT Entered By: Worthy Keeler on 06/21/2019 17:45:57

## 2019-06-22 NOTE — Progress Notes (Signed)
TONIA, CLOWE ([id number]) Visit Report for 06/21/2019 Problem List Details Patient Name: KASRA, HELLBERG Date of Service: 06/21/2019 8:00 AM Medical Record Number: [id number] Patient Account Number: [id number] Date of Birth/Sex: September 19, 1950 (68 y.o. M) Treating RN: Montey Hora Primary Care Provider: [name] Other Clinician: [name] Referring Provider: [name] Treating Provider/Extender: [name], HOYT Weeks in Treatment: 29 Active Problems ICD-10 Evaluated Encounter Code Description Active Date Today Diagnosis L89.314 Pressure ulcer of right buttock, stage 4 11/30/2017 No Yes M86.68 Other chronic osteomyelitis, other site 04/08/2019 No Yes L03.317 Cellulitis of buttock 06/21/2018 No Yes G82.20 Paraplegia, unspecified 11/30/2017 No Yes S34.109S Unspecified injury to unspecified level of lumbar spinal cord, 11/30/2017 No Yes sequela I10 Essential (primary) hypertension 11/30/2017 No Yes Inactive Problems Resolved Problems Electronic Signature(s) Signed: 06/21/2019 5:47:36 PM By: Worthy Keeler [name] Entered By: Worthy Keeler on 06/21/2019 17:45:03 [name] ([id number]) -------------------------------------------------------------------------------- SuperBill Details Patient Name: [name] Date of Service: 06/21/2019 Medical Record Number: [id number] Patient Account Number: [id number] Date of Birth/Sex: [birth year] (68 y.o. 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, sequela I10  Essential (primary) hypertension Facility Procedures CPT4 Code: [id number] Description: (Facility Use Only) HBOT, full body chamber, 64min Modifier: Quantity: 4 Physician Procedures CPT4 Code: [id number] Description: [id number] - WC PHYS HYPERBARIC OXYGEN THERAPY ICD-10 Diagnosis Description M86.68 Other chronic osteomyelitis, other site L89.314 Pressure ulcer of right buttock, stage 4 L03.317 Cellulitis of buttock G82.20 Paraplegia, unspecified Modifier: Quantity: 1 Electronic Signature(s) Signed: 06/21/2019 5:47:36 PM By: Worthy Keeler [name] Previous Signature: 06/21/2019 12:01:49 PM Version By: Lorine Bears RCP, RRT, CHT Entered By: Worthy Keeler on 06/21/2019 17:45:00

## 2019-06-22 NOTE — Progress Notes (Signed)
ANSLEY, SAHLI ([id number]) Visit Report for 06/20/2019 HBO Details Patient Name: [name], [name] Date of Service: 06/20/2019 8:00 AM Medical Record Number: [id number] Patient Account Number: [id number] Date of Birth/Sex: February 05, 1951 (68 y.o. 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 HBO Treatment Details Treatment Number: 40 Patient Type: Outpatient Chamber Type: Monoplace Chamber Serial #: [id number] Treatment Protocol: 2.0 ATA with 90 minutes oxygen, and no air breaks Treatment Details Compression Rate Down: 1.5 psi / minute De-Compression Rate Up: 1.5 psi / minute Compress Tx Pressure Air breaks and breathing periods Decompress Decompress Begins Reached (leave unused spaces blank) Begins Ends Chamber Pressure (ATA) 1 2 - - - - - - 2 1 Clock Time (24 hr) 08:20 08:30 - - - - - - 10:00 10:10 Treatment Length: 110 (minutes) Treatment Segments: 4 Vital Signs Capillary Blood Glucose Reference Range: 80 - 120 mg / dl HBO Diabetic Blood Glucose Intervention Range: <131 mg/dl or >249 mg/dl Time Vitals Blood Respiratory Capillary Blood Glucose Pulse Action Type: Pulse: Temperature: Taken: Pressure: Rate: Glucose (mg/dl): Meter #: Oximetry (%) Taken: Pre 08:16 120/72 60 16 99 Post 10:15 132/74 60 16 98.6 Treatment Response Treatment Toleration: Well Treatment Completion Treatment Completed without Adverse Event Status: HBO Attestation I certify that I supervised this HBO treatment in accordance with Medicare guidelines. A trained emergency response Yes team is readily  available per hospital policies and procedures. Continue HBOT as ordered. Yes Electronic Signature(s) SHADD, LEGNON ([id number]) Signed: 06/21/2019 5:47:36 PM By: Worthy Keeler [name] Previous Signature: 06/20/2019 12:35:11 PM Version By: Lorine Bears RCP, RRT, CHT Entered By: Worthy Keeler on 06/21/2019 17:45:52 CHISHOLM, CONARY ([id number]) -------------------------------------------------------------------------------- HBO Safety Checklist Details Patient Name: [name] Date of Service: 06/20/2019 8:00 AM Medical Record Number: [id number] Patient Account Number: [id number] Date of Birth/Sex: 08-Sep-1950 (68 y.o. M) Treating RN: Harold Barban Primary Care Tamieka Rancourt: [name] Other Clinician: [name] Referring Regina Coppolino: [name] Treating Tyleek Smick/Extender: [name], HOYT Weeks in Treatment: 81 HBO Safety Checklist Items Safety Checklist Consent Form Signed Patient voided / foley secured and emptied When did you last eato morning Last dose of injectable or oral agent n/a Ostomy pouch emptied and vented if applicable NA Medtronic Baclofen Pump; Nevro All implantable devices assessed, documented and approved Spinal Stimulator Intravenous access site secured and place NA Valuables secured Linens and cotton and cotton/polyester blend (less than 51% polyester) Personal oil-based products / skin lotions / body lotions removed Wigs or hairpieces removed NA Smoking or tobacco materials removed NA Books / newspapers / magazines / loose paper removed Cologne, aftershave, perfume and deodorant removed Jewelry removed (may wrap wedding band) Make-up removed NA Hair care products removed Battery operated devices (external) removed NPWT pump Heating patches and chemical warmers removed Titanium eyewear removed NA Nail polish cured greater than 10 hours NA Casting material cured greater than 10 hours NA Hearing aids  removed NA Loose dentures or partials removed NA Prosthetics have been removed NA Patient demonstrates correct use of air break device (if applicable) Patient concerns have been addressed Patient grounding bracelet on and cord attached to chamber Specifics for Inpatients (complete in addition to above) Medication sheet sent with patient Intravenous medications needed  or due during therapy sent with patient Drainage tubes (e.g. nasogastric tube or chest tube secured and vented) Endotracheal or Tracheotomy tube secured Cuff deflated of air and inflated with saline Airway suctioned Electronic Signature(s) NYAIRE, AMJAD ([id number]) Signed: 06/20/2019 12:35:11 PM By: Lorine Bears RCP, RRT, CHT Entered By: Lorine Bears on 06/20/2019 10:04:24

## 2019-06-22 NOTE — Progress Notes (Signed)
[name], HAY ([id number]) Visit Report for 06/20/2019 Arrival Information Details Patient Name: [name] Date of Service: 06/20/2019 10:15 AM Medical Record Number: [id number] Patient Account Number: [id number] Date of Birth/Sex: 04/25/1951 (68 y.o. M) Treating RN: Harold Barban Primary Care Stanford Strauch: [name] Other Clinician: Referring Keora Eccleston: [name] Treating Christropher Gintz/Extender: [name], HOYT Weeks in Treatment: 43 Visit Information History Since Last Visit Added or deleted any medications: No Patient Arrived: Wheel Chair Any new allergies or adverse reactions: No Arrival Time: 10:15 Had a fall or experienced change in No Accompanied By: daughter, activities of daily living that may affect Joelene Millin risk of falls: Transfer Assistance: Civil Service fast streamer Signs or symptoms of abuse/neglect since last visito No Patient Identification Verified: Yes Hospitalized since last visit: No Secondary Verification Process Yes Implantable device outside of the clinic excluding No Completed: cellular tissue based products placed in the center Patient Requires Transmission-Based No since last visit: Precautions: Has Dressing in Place as Prescribed: Yes Patient Has Alerts: Yes Pain Present Now: No Patient Alerts: NOT Diabetic Electronic Signature(s) Signed: 06/20/2019 12:35:11 PM By: Lorine Bears RCP, RRT, CHT Entered By: Lorine Bears on 06/20/2019 10:27:33 Brett, [name] ([id number]) -------------------------------------------------------------------------------- Clinic Level of Care Assessment Details Patient Name: [name] Date of Service: 06/20/2019 10:15 AM Medical Record Number: [id number] Patient Account Number: [id number] Date of Birth/Sex: [birth year] (68 y.o. M) Treating RN: Harold Barban Primary Care Dreonna Hussein: [name] Other Clinician: Referring Asuncion Tapscott: [name] Treating  Kryslyn Helbig/Extender: [name], HOYT Weeks in Treatment: 18 Clinic Level of Care Assessment Items TOOL 4 Quantity Score []  - Use when only an EandM is performed on FOLLOW-UP visit 0 ASSESSMENTS - Nursing Assessment / Reassessment X - Reassessment of Co-morbidities (includes updates in patient status) 1 10 X- 1 5 Reassessment of Adherence to Treatment Plan ASSESSMENTS - Wound and Skin Assessment / Reassessment X - Simple Wound Assessment / Reassessment - one wound 1 5 []  - 0 Complex Wound Assessment / Reassessment - multiple wounds []  - 0 Dermatologic / Skin Assessment (not related to wound area) ASSESSMENTS - Focused Assessment []  - Circumferential Edema Measurements - multi extremities 0 []  - 0 Nutritional Assessment / Counseling / Intervention []  - 0 Lower Extremity Assessment (monofilament, tuning fork, pulses) []  - 0 Peripheral Arterial Disease Assessment (using hand held doppler) ASSESSMENTS - Ostomy and/or Continence Assessment and Care []  - Incontinence Assessment and Management 0 []  - 0 Ostomy Care Assessment and Management (repouching, etc.) PROCESS - Coordination of Care X - Simple Patient / Family Education for ongoing care 1 15 []  - 0 Complex (extensive) Patient / Family Education for ongoing care []  - 0 Staff obtains Programmer, systems, Records, Test Results / Process Orders X- 1 10 Staff telephones HHA, Nursing Homes / Clarify orders / etc []  - 0 Routine Transfer to another Facility (non-emergent condition) []  - 0 Routine Hospital Admission (non-emergent condition) []  - 0 New Admissions / Biomedical engineer / Ordering NPWT, Apligraf, etc. []  - 0 Emergency Hospital Admission (emergent condition) X- 1 10 Simple Discharge Coordination DARRYN, FUGUA. ([id number]) []  - 0 Complex (extensive) Discharge Coordination PROCESS - Special Needs []  - Pediatric / Minor Patient Management 0 []  - 0 Isolation Patient Management []  - 0 Hearing / Language / Visual  special needs []  - 0 Assessment of Community assistance (transportation, D/C planning, etc.) []  - 0 Additional assistance / Altered mentation []  - 0 Support Surface(s) Assessment (bed, cushion, seat, etc.) INTERVENTIONS - Wound Cleansing / Measurement X - Simple  Wound Cleansing - one wound 1 5 []  - 0 Complex Wound Cleansing - multiple wounds X- 1 5 Wound Imaging (photographs - any number of wounds) []  - 0 Wound Tracing (instead of photographs) X- 1 5 Simple Wound Measurement - one wound []  - 0 Complex Wound Measurement - multiple wounds INTERVENTIONS - Wound Dressings X - Small Wound Dressing one or multiple wounds 1 10 []  - 0 Medium Wound Dressing one or multiple wounds []  - 0 Large Wound Dressing one or multiple wounds []  - 0 Application of Medications - topical []  - 0 Application of Medications - injection INTERVENTIONS - Miscellaneous []  - External ear exam 0 []  - 0 Specimen Collection (cultures, biopsies, blood, body fluids, etc.) []  - 0 Specimen(s) / Culture(s) sent or taken to Lab for analysis X- 1 10 Patient Transfer (multiple staff / Civil Service fast streamer / Similar devices) []  - 0 Simple Staple / Suture removal (25 or less) []  - 0 Complex Staple / Suture removal (26 or more) []  - 0 Hypo / Hyperglycemic Management (close monitor of Blood Glucose) []  - 0 Ankle / Brachial Index (ABI) - do not check if billed separately X- 1 5 Vital Signs Devino, Domonique J. ([id number]) Has the patient been seen at the hospital within the last three years: Yes Total Score: 95 Level Of Care: New/Established - Level 3 Electronic Signature(s) Signed: 06/22/2019 4:10:47 PM By: Harold Barban Entered By: Harold Barban on 06/20/2019 11:29:49 [name] ([id number]) -------------------------------------------------------------------------------- Encounter Discharge Information Details Patient Name: [name] Date of Service: 06/20/2019 10:15 AM Medical Record  Number: [id number] Patient Account Number: [id number] Date of Birth/Sex: 07-Oct-1950 (68 y.o. M) Treating RN: Harold Barban Primary Care Haskell Rihn: [name] Other Clinician: Referring Johnthan Axtman: [name] Treating Frenchie Dangerfield/Extender: [name], HOYT Weeks in Treatment: 62 Encounter Discharge Information Items Discharge Condition: Stable Ambulatory Status: Wheelchair Discharge Destination: Home Transportation: Private Auto Accompanied By: family Schedule Follow-up Appointment: Yes Clinical Summary of Care: Electronic Signature(s) Signed: 06/20/2019 11:30:39 AM By: Harold Barban Entered By: Harold Barban on 06/20/2019 11:30:39 [name] ([id number]) -------------------------------------------------------------------------------- Lower Extremity Assessment Details Patient Name: [name] Date of Service: 06/20/2019 10:15 AM Medical Record Number: [id number] Patient Account Number: [id number] Date of Birth/Sex: 12-Nov-1950 (68 y.o. M) Treating RN: Army Melia Primary Care Arty Lantzy: [name] Other Clinician: Referring Ules Marsala: [name] Treating Kamari Bilek/Extender: [name], HOYT Weeks in Treatment: 53 Electronic Signature(s) Signed: 06/20/2019 10:41:58 AM By: Army Melia Entered By: Army Melia on 06/20/2019 10:41:58 JRUE, ORMES ([id number]) -------------------------------------------------------------------------------- Multi Wound Chart Details Patient Name: [name] Date of Service: 06/20/2019 10:15 AM Medical Record Number: [id number] Patient Account Number: [id number] Date of Birth/Sex: 06/09/1951 (68 y.o. M) Treating RN: Harold Barban Primary Care Braylen Denunzio: [name] Other Clinician: Referring Christol Thetford: [name] Treating Ainsleigh Kakos/Extender: [name], HOYT Weeks in Treatment: 81 Vital Signs Height(in): 73 Pulse(bpm): 60 Weight(lbs): 210 Blood Pressure(mmHg): 132/74 Body Mass  Index(BMI): 28 Temperature(F): 98.6 Respiratory Rate 16 (breaths/min): Photos: [N/A:N/A] Wound Location: Right Gluteal fold N/A N/A Wounding Event: Pressure Injury N/A N/A Primary Etiology: Pressure Ulcer N/A N/A Comorbid History: Cataracts, Hypertension, N/A N/A Paraplegia Date Acquired: 11/02/2017 N/A N/A Weeks of Treatment: 81 N/A N/A Wound Status: Open N/A N/A Measurements L x W x D 3x2.5x3 N/A N/A (cm) Area (cm) : 5.89 N/A N/A Volume (cm) : 17.671 N/A N/A % Reduction in Area: 41.40% N/A N/A % Reduction in Volume: -1658.30% N/A N/A Position 1 (o'clock): 12 Maximum Distance 1 (cm): 4.5 Tunneling: Yes N/A N/A  Classification: Category/Stage IV N/A N/A Exudate Amount: Large N/A N/A Exudate Type: Serous N/A N/A Exudate Color: amber N/A N/A Wound Margin: Epibole N/A N/A Granulation Amount: Large (67-100%) N/A N/A Granulation Quality: Pink, Hyper-granulation N/A N/A Necrotic Amount: Small (1-33%) N/A N/A Exposed Structures: Fat Layer (Subcutaneous N/A N/A Tissue) Exposed: Yes Muscle: Yes Fascia: No SEKOU, ATTEBERY. ([id number]) Tendon: No Joint: No Bone: No Epithelialization: Small (1-33%) N/A N/A Treatment Notes Electronic Signature(s) Signed: 06/20/2019 11:14:32 AM By: Harold Barban Entered By: Harold Barban on 06/20/2019 11:14:32 [name] ([id number]) -------------------------------------------------------------------------------- [place] Details Patient Name: [name] Date of Service: 06/20/2019 10:15 AM Medical Record Number: [id number] Patient Account Number: [id number] Date of Birth/Sex: [birth year] (68 y.o. M) Treating RN: Harold Barban Primary Care Jayani Rozman: [name] Other Clinician: Referring Quana Chamberlain: [name] Treating Slate Debroux/Extender: [name], HOYT Weeks in Treatment: 46 Active Inactive Orientation to the Wound Care Program Nursing Diagnoses: Knowledge deficit related to the  wound healing center program Goals: Patient/caregiver will verbalize understanding of the [facility] [place] Program Date Initiated: 11/30/2017 Target Resolution Date: 12/21/2017 Goal Status: Active Interventions: Provide education on orientation to the wound center Notes: Pressure Nursing Diagnoses: Knowledge deficit related to management of pressures ulcers Goals: Patient/caregiver will verbalize understanding of pressure ulcer management Date Initiated: 05/17/2018 Target Resolution Date: 05/28/2018 Goal Status: Active Interventions: Assess: immobility, friction, shearing, incontinence upon admission and as needed Notes: Wound/Skin Impairment Nursing Diagnoses: Impaired tissue integrity Goals: Patient/caregiver will verbalize understanding of skin care regimen Date Initiated: 11/30/2017 Target Resolution Date: 12/21/2017 Goal Status: Active Ulcer/skin breakdown will have a volume reduction of 30% by week 4 Date Initiated: 11/30/2017 Target Resolution Date: 12/21/2017 Goal Status: Active IDELL, MATTHEY ([id number]) Interventions: Assess patient/caregiver ability to obtain necessary supplies Assess patient/caregiver ability to perform ulcer/skin care regimen upon admission and as needed Assess ulceration(s) every visit Treatment Activities: Skin care regimen initiated : 11/30/2017 Notes: Electronic Signature(s) Signed: 06/20/2019 11:14:20 AM By: Harold Barban Entered By: Harold Barban on 06/20/2019 11:14:19 [name] ([id number]) -------------------------------------------------------------------------------- Pain Assessment Details Patient Name: [name] Date of Service: 06/20/2019 10:15 AM Medical Record Number: [id number] Patient Account Number: [id number] Date of Birth/Sex: 05-24-1951 (68 y.o. M) Treating RN: Harold Barban Primary Care Zaydyn Havey: [name] Other Clinician: Referring Britiney Blahnik: [name] Treating  Emmalin Jaquess/Extender: [name], HOYT Weeks in Treatment: 28 Active Problems Location of Pain Severity and Description of Pain Patient Has Paino No Site Locations Pain Management and Medication Current Pain Management: Electronic Signature(s) Signed: 06/20/2019 12:35:11 PM By: Paulla Fore, RRT, CHT Signed: 06/22/2019 4:10:47 PM By: Harold Barban Entered By: Lorine Bears on 06/20/2019 10:27:40 [name] ([id number]) -------------------------------------------------------------------------------- Patient/Caregiver Education Details Patient Name: [name] Date of Service: 06/20/2019 10:15 AM Medical Record Number: [id number] Patient Account Number: [id number] Date of Birth/Gender: 1951-02-12 (68 y.o. M) Treating RN: Harold Barban Primary Care Physician: [name] Other Clinician: Referring Physician: [name] Treating Physician/Extender: Sharalyn Ink in Treatment: 18 Education Assessment Education Provided To: Patient Education Topics Provided Wound/Skin Impairment: Handouts: Caring for Your Ulcer Methods: Demonstration, Explain/Verbal Responses: State content correctly Electronic Signature(s) Signed: 06/22/2019 4:10:47 PM By: Harold Barban Entered By: Harold Barban on 06/20/2019 11:15:03 [name] ([id number]) -------------------------------------------------------------------------------- Wound Assessment Details Patient Name: [name] Date of Service: 06/20/2019 10:15 AM Medical Record Number: [id number] Patient Account Number: [id number] Date of Birth/Sex: 1950/11/15 (68 y.o. M) Treating RN: Army Melia Primary Care Dimetri Armitage: [name] Other Clinician: Referring Visente Kirker: [name]  Treating Davonn Flanery/Extender: [name], HOYT Weeks in Treatment: 81 Wound Status Wound Number: 1 Primary Etiology: Pressure Ulcer Wound Location: Right Gluteal  fold Wound Status: Open Wounding Event: Pressure Injury Comorbid History: Cataracts, Hypertension, Paraplegia Date Acquired: 11/02/2017 Weeks Of Treatment: 81 Clustered Wound: No Photos Wound Measurements Length: (cm) 3 % Reductio Width: (cm) 2.5 % Reductio Depth: (cm) 3 Epithelial Area: (cm) 5.89 Tunneling Volume: (cm) 17.671 Positi Maximum n in Area: 41.4% n in Volume: -1658.3% ization: Small (1-33%) : Yes on (o'clock): 12 Distance: (cm) 4.5 Undermining: No Wound Description Classification: Category/Stage IV Foul Odor Wound Margin: Epibole Slough/Fib Exudate Amount: Large Exudate Type: Serous Exudate Color: amber After Cleansing: No rino Yes Wound Bed Granulation Amount: Large (67-100%) Exposed Structure Granulation Quality: Pink, Hyper-granulation Fascia Exposed: No Necrotic Amount: Small (1-33%) Fat Layer (Subcutaneous Tissue) Exposed: Yes Necrotic Quality: Adherent Slough Tendon Exposed: No Muscle Exposed: Yes Necrosis of Muscle: No Joint Exposed: No BROOKS, CONNER. ([id number]) Bone Exposed: No Treatment Notes Wound #1 (Right Gluteal fold) Notes zinc to peri wound, silvercel, xtrasorb and tegaderm Electronic Signature(s) Signed: 06/20/2019 12:57:44 PM By: Army Melia Previous Signature: 06/20/2019 10:41:49 AM Version By: Army Melia Entered By: Army Melia on 06/20/2019 11:00:03 [name] ([id number]) -------------------------------------------------------------------------------- Vitals Details Patient Name: [name] Date of Service: 06/20/2019 10:15 AM Medical Record Number: [id number] Patient Account Number: [id number] Date of Birth/Sex: 1951/04/19 (68 y.o. M) Treating RN: Harold Barban Primary Care Vincente Asbridge: [name] Other Clinician: Referring Graycen Degan: [name] Treating Ely Ballen/Extender: [name], HOYT Weeks in Treatment: 61 Vital Signs Time Taken: 10:15 Temperature (F): 98.6 Height (in):  73 Pulse (bpm): 60 Weight (lbs): 210 Respiratory Rate (breaths/min): 16 Body Mass Index (BMI): 27.7 Blood Pressure (mmHg): 132/74 Reference Range: 80 - 120 mg / dl Electronic Signature(s) Signed: 06/20/2019 12:35:11 PM By: Lorine Bears RCP, RRT, CHT Entered By: Lorine Bears on 06/20/2019 10:28:13

## 2019-06-22 NOTE — Progress Notes (Signed)
[name], [name] ([id number]) Visit Report for 06/22/2019 HBO Details Patient Name: [name], [name] Date of Service: 06/22/2019 8:00 AM Medical Record Number: [id number] Patient Account Number: [id number] Date of Birth/Sex: 02-12-1951 (68 y.o. 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 HBO Treatment Details Treatment Number: 42 Patient Type: Outpatient Chamber Type: Monoplace Chamber Serial #: [id number] Treatment Protocol: 2.0 ATA with 90 minutes oxygen, and no air breaks Treatment Details Compression Rate Down: 1.5 psi / minute De-Compression Rate Up: 1.5 psi / minute Compress Tx Pressure Air breaks and breathing periods Decompress Decompress Begins Reached (leave unused spaces blank) Begins Ends Chamber Pressure (ATA) 1 2 - - - - - - 2 1 Clock Time (24 hr) 08:18 08:30 - - - - - - 10:00 10:10 Treatment Length: 112 (minutes) Treatment Segments: 4 Vital Signs Capillary Blood Glucose Reference Range: 80 - 120 mg / dl HBO Diabetic Blood Glucose Intervention Range: <131 mg/dl or >249 mg/dl Time Vitals Blood Respiratory Capillary Blood Glucose Pulse Action Type: Pulse: Temperature: Taken: Pressure: Rate: Glucose (mg/dl): Meter #: Oximetry (%) Taken: Pre 08:14 118/76 72 16 98.6 Post 10:16 122/62 60 16 98.2 Treatment Response Treatment Toleration: Well Treatment Completion Treatment Completed without Adverse Event Status: [name] Notes No concerns with treatment given HBO Attestation I certify that I supervised this HBO treatment in accordance with Medicare guidelines.  A trained emergency response Yes team is readily available per hospital policies and procedures. Continue HBOT as ordered. Yes [name], [name] ([id number]) Electronic Signature(s) Signed: 06/22/2019 4:48:24 PM By: Linton Ham MD Previous Signature: 06/22/2019 10:23:01 AM Version By: Lorine Bears RCP, RRT, CHT Entered By: Linton Ham on 06/22/2019 16:47:43 [name], [name] ([id number]) -------------------------------------------------------------------------------- HBO Safety Checklist Details Patient Name: [name] Date of Service: 06/22/2019 8:00 AM Medical Record Number: [id number] Patient Account Number: [id number] Date of Birth/Sex: 09/14/1950 (68 y.o. M) Treating RN: Harold Barban Primary Care Diamone Whistler: [name] Other Clinician: [name] Referring Adilenne Ashworth: [name] Treating Allisha Harter/Extender: [name] in Treatment: 67 HBO Safety Checklist Items Safety Checklist Consent Form Signed Patient voided / foley secured and emptied When did you last eato morning Last dose of injectable or oral agent n/a Ostomy pouch emptied and vented if applicable NA Medtronic Baclofen Pump; Nevro All implantable devices assessed, documented and approved Spinal Stimulator Intravenous access site secured and place NA Valuables secured Linens and cotton and cotton/polyester blend (less than 51% polyester) Personal oil-based products / skin lotions / body lotions removed Wigs or hairpieces removed NA Smoking or tobacco materials removed NA Books / newspapers / magazines / loose paper removed Cologne, aftershave, perfume and deodorant removed Jewelry removed (may wrap wedding band) Make-up removed NA Hair care products removed Battery operated devices (external) removed NPWT pump Heating patches and chemical warmers removed Titanium eyewear removed NA Nail polish cured greater than 10 hours NA Casting  material cured greater than 10 hours NA Hearing aids removed NA Loose dentures or partials removed NA Prosthetics have been removed NA Patient demonstrates correct use of air break device (if applicable) Patient concerns have been addressed Patient grounding bracelet on and cord attached to chamber Specifics for Inpatients (complete in addition to above) Medication sheet sent  with patient Intravenous medications needed or due during therapy sent with patient Drainage tubes (e.g. nasogastric tube or chest tube secured and vented) Endotracheal or Tracheotomy tube secured Cuff deflated of air and inflated with saline Airway suctioned Electronic Signature(s) [name], [name] ([id number]) Signed: 06/22/2019 10:23:01 AM By: Lorine Bears RCP, RRT, CHT Entered By: Lorine Bears on 06/22/2019 09:29:33

## 2019-06-22 NOTE — Progress Notes (Signed)
**Note Brett-Identified via Obfuscation** DELTON, [name] ([id number]) Visit Report for 06/21/2019 HBO Details Patient Name: [name], [name] Date of Service: 06/21/2019 8:00 AM Medical Record Number: [id number] Patient Account Number: [id number] Date of Birth/Sex: Mar 19, 1951 (68 y.o. 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 HBO Treatment Details Treatment Number: 41 Patient Type: Outpatient Chamber Type: Monoplace Chamber Serial #: [id number] Treatment Protocol: 2.0 ATA with 90 minutes oxygen, and no air breaks Treatment Details Compression Rate Down: 1.5 psi / minute Brett-Compression Rate Up: 1.5 psi / minute Compress Tx Pressure Air breaks and breathing periods Decompress Decompress Begins Reached (leave unused spaces blank) Begins Ends Chamber Pressure (ATA) 1 2 - - - - - - 2 1 Clock Time (24 hr) 08:22 08:33 - - - - - - 10:04 10:14 Treatment Length: 112 (minutes) Treatment Segments: 4 Vital Signs Capillary Blood Glucose Reference Range: 80 - 120 mg / dl HBO Diabetic Blood Glucose Intervention Range: <131 mg/dl or >249 mg/dl Time Vitals Blood Respiratory Capillary Blood Glucose Pulse Action Type: Pulse: Temperature: Taken: Pressure: Rate: Glucose (mg/dl): Meter #: Oximetry (%) Taken: Pre 08:21 126/74 72 16 98.8 Post 10:19 126/80 72 16 98 Treatment Response Treatment Toleration: Well Treatment Completion Treatment Completed without Adverse Event Status: HBO Attestation I certify that I supervised this HBO treatment in accordance with Medicare guidelines. A trained emergency response Yes team is readily  available per hospital policies and procedures. Continue HBOT as ordered. Yes Electronic Signature(s) PLUMER, NEIN ([id number]) Signed: 06/21/2019 5:47:36 PM By: Worthy Keeler [name] Previous Signature: 06/21/2019 12:01:49 PM Version By: Lorine Bears RCP, RRT, CHT Entered By: Worthy Keeler on 06/21/2019 17:44:55 [name], [name] ([id number]) -------------------------------------------------------------------------------- HBO Safety Checklist Details Patient Name: [name] Date of Service: 06/21/2019 8:00 AM Medical Record Number: [id number] Patient Account Number: [id number] Date of Birth/Sex: 08-22-1950 (68 y.o. M) Treating RN: Montey Hora Primary Care Irish Breisch: [name] Other Clinician: [name] Referring Siara Gorder: [name] Treating Jazzlyn Huizenga/Extender: [name], HOYT Weeks in Treatment: 81 HBO Safety Checklist Items Safety Checklist Consent Form Signed Patient voided / foley secured and emptied When did you last eato morning Last dose of injectable or oral agent n/a Ostomy pouch emptied and vented if applicable NA Medtronic Baclofen Pump; Nevro All implantable devices assessed, documented and approved Spinal Stimulator Intravenous access site secured and place NA Valuables secured Linens and cotton and cotton/polyester blend (less than 51% polyester) Personal oil-based products / skin lotions / body lotions removed Wigs or hairpieces removed NA Smoking or tobacco materials removed NA Books / newspapers / magazines / loose paper removed Cologne, aftershave, perfume and deodorant removed Jewelry removed (may wrap wedding band) Make-up removed NA Hair care products removed Battery operated devices (external) removed NPWT pump Heating patches and chemical warmers removed Titanium eyewear removed NA Nail polish cured greater than 10 hours NA Casting material cured greater than 10 hours NA Hearing aids  removed NA Loose dentures or partials removed NA Prosthetics have been removed NA Patient demonstrates correct use of air break device (if applicable) Patient concerns have been addressed Patient grounding bracelet on and cord attached to chamber Specifics for Inpatients (complete in addition to above) Medication sheet sent with patient Intravenous medications needed  or due during therapy sent with patient Drainage tubes (e.g. nasogastric tube or chest tube secured and vented) Endotracheal or Tracheotomy tube secured Cuff deflated of air and inflated with saline Airway suctioned Electronic Signature(s) BRANNAN, KREUTZER ([id number]) Signed: 06/21/2019 12:01:49 PM By: Lorine Bears RCP, RRT, CHT Entered By: Lorine Bears on 06/21/2019 08:55:00

## 2019-06-23 ENCOUNTER — Encounter: Payer: Medicare Other | Admitting: Physician Assistant

## 2019-06-23 DIAGNOSIS — L89314 Pressure ulcer of right buttock, stage 4: Secondary | ICD-10-CM | POA: Diagnosis not present

## 2019-06-23 NOTE — Progress Notes (Signed)
JADIN, NOVELLO ([id number]) Visit Report for 06/23/2019 Arrival Information Details Patient Name: [name], [name] Date of Service: 06/23/2019 8:00 AM Medical Record Number: [id number] Patient Account Number: [id number] Date of Birth/Sex: 11-Jan-1951 (68 y.o. M) Treating RN: Army Melia Primary Care Nikea Settle: [name] Other Clinician: [name] Referring Valisa Karpel: [name] Treating Peytan Andringa/Extender: [name], HOYT Weeks in Treatment: 74 Visit Information History Since Last Visit Added or deleted any medications: No Patient Arrived: Wheel Chair Any new allergies or adverse reactions: No Arrival Time: 08:02 Had a fall or experienced change in No Accompanied By: wife activities of daily living that may affect Transfer Assistance: Harrel Lemon Lift risk of falls: Patient Identification Verified: Yes Signs or symptoms of abuse/neglect since last visito No Secondary Verification Process Completed: Yes Hospitalized since last visit: No Patient Requires Transmission-Based No Implantable device outside of the clinic excluding No Precautions: cellular tissue based products placed in the center Patient Has Alerts: Yes since last visit: Patient Alerts: NOT Has Dressing in Place as Prescribed: Yes Diabetic Pain Present Now: No Electronic Signature(s) Signed: 06/23/2019 10:53:01 AM By: Lorine Bears RCP, RRT, CHT Entered By: Lorine Bears on 06/23/2019 10:52:02 [name], [name] ([id number]) -------------------------------------------------------------------------------- Encounter Discharge Information Details Patient Name: [name] Date of Service: 06/23/2019 8:00 AM Medical Record Number: [id number] Patient Account Number: [id number] Date of Birth/Sex: 05/22/1951 (68 y.o. M) Treating RN: Army Melia Primary Care Marquist Binstock: [name] Other Clinician: [name] Referring Ananda Caya: [name] Treating Prestyn Stanco/Extender: [name], HOYT Weeks in Treatment: 18 Encounter Discharge Information Items Discharge Condition: Stable Ambulatory Status: Wheelchair Discharge Destination: Home Transportation: Private Auto Accompanied By: wife Schedule Follow-up Appointment: Yes Clinical Summary of Care: Notes Patient has an HBO treatment scheduled on 06/24/2019 at 08:00 am. Electronic Signature(s) Signed: 06/23/2019 10:53:01 AM By: Lorine Bears RCP, RRT, CHT Entered By: Lorine Bears on 06/23/2019 10:52:46 [name], [name] ([id number]) -------------------------------------------------------------------------------- Vitals Details Patient Name: [name] Date of Service: 06/23/2019 8:00 AM Medical Record Number: [id number] Patient Account Number: [id number] Date of Birth/Sex: April 14, 1951 (68 y.o. M) Treating RN: Army Melia Primary Care Naya Ilagan: [name] Other Clinician: [name] Referring Aunesti Pellegrino: [name] Treating Teandre Hamre/Extender: [name], HOYT Weeks in Treatment: 106 Vital Signs Time Taken: 08:15 Temperature (F): 98.0 Height (in): 73 Pulse (bpm): 66 Weight (lbs): 210 Respiratory Rate (breaths/min): 16 Body Mass Index (BMI): 27.7 Blood Pressure (mmHg): 132/72 Reference Range: 80 - 120 mg / dl Electronic Signature(s) Signed: 06/23/2019 10:53:01 AM By: Lorine Bears RCP, RRT, CHT Entered By: Lorine Bears on 06/23/2019 10:52:30

## 2019-06-23 NOTE — Progress Notes (Signed)
COBY, LAS ([id number]) Visit Report for 06/23/2019 HBO Details Patient Name: [name], [name] Date of Service: 06/23/2019 8:00 AM Medical Record Number: [id number] Patient Account Number: [id number] Date of Birth/Sex: [birth year] (68 y.o. 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 HBO Treatment Details Treatment Number: 43 Patient Type: Outpatient Chamber Type: Monoplace Chamber Serial #: [id number] Treatment Protocol: 2.0 ATA with 90 minutes oxygen, and no air breaks Treatment Details Compression Rate Down: 1.5 psi / minute De-Compression Rate Up: 1.5 psi / minute Compress Tx Pressure Air breaks and breathing periods Decompress Decompress Begins Reached (leave unused spaces blank) Begins Ends Chamber Pressure (ATA) 1 2 - - - - - - 2 1 Clock Time (24 hr) 08:22 08:34 - - - - - - 10:04 10:14 Treatment Length: 112 (minutes) Treatment Segments: 4 Vital Signs Capillary Blood Glucose Reference Range: 80 - 120 mg / dl HBO Diabetic Blood Glucose Intervention Range: <131 mg/dl or >249 mg/dl Time Vitals Blood Respiratory Capillary Blood Glucose Pulse Action Type: Pulse: Temperature: Taken: Pressure: Rate: Glucose (mg/dl): Meter #: Oximetry (%) Taken: Pre 08:15 132/72 66 16 98 Post 10:19 126/76 66 16 97.8 Treatment Response Treatment Toleration: Well Treatment Completion Treatment Completed without Adverse Event Status: HBO Attestation I certify that I supervised this HBO treatment in accordance with Medicare guidelines. A trained emergency response Yes team is readily  available per hospital policies and procedures. Continue HBOT as ordered. Yes Electronic Signature(s) [name], [name] ([id number]) Signed: 06/23/2019 5:16:14 PM By: Worthy Keeler [name] Previous Signature: 06/23/2019 10:53:01 AM Version By: Lorine Bears RCP, RRT, CHT Entered By: Worthy Keeler on 06/23/2019 17:16:14 [name], [name] ([id number]) -------------------------------------------------------------------------------- HBO Safety Checklist Details Patient Name: [name] Date of Service: 06/23/2019 8:00 AM Medical Record Number: [id number] Patient Account Number: [id number] Date of Birth/Sex: [birth year] (68 y.o. M) Treating RN: Army Melia Primary Care Eyvette Cordon: [name] Other Clinician: [name] Referring Suheily Birks: [name] Treating Jomarie Gellis/Extender: [name], HOYT Weeks in Treatment: 81 HBO Safety Checklist Items Safety Checklist Consent Form Signed Patient voided / foley secured and emptied When did you last eato morning Last dose of injectable or oral agent n/a Ostomy pouch emptied and vented if applicable NA Medtronic Baclofen Pump; Nevro All implantable devices assessed, documented and approved Spinal Stimulator Intravenous access site secured and place NA Valuables secured Linens and cotton and cotton/polyester blend (less than 51% polyester) Personal oil-based products / skin lotions / body lotions removed Wigs or hairpieces removed NA Smoking or tobacco materials removed NA Books / newspapers / magazines / loose paper removed Cologne, aftershave, perfume and deodorant removed Jewelry removed (may wrap wedding band) Make-up removed NA Hair care products removed Battery operated devices (external) removed NPWT pump Heating patches and chemical warmers removed Titanium eyewear removed NA Nail polish cured greater than 10 hours NA Casting material cured greater than 10 hours NA Hearing aids  removed NA Loose dentures or partials removed NA Prosthetics have been removed NA Patient demonstrates correct use of air break device (if applicable) Patient concerns have been addressed Patient grounding bracelet on and cord attached to chamber Specifics for Inpatients (complete in addition to above) Medication sheet sent with patient Intravenous medications needed  or due during therapy sent with patient Drainage tubes (e.g. nasogastric tube or chest tube secured and vented) Endotracheal or Tracheotomy tube secured Cuff deflated of air and inflated with saline Airway suctioned Electronic Signature(s) [name], [name] ([id number]) Signed: 06/23/2019 10:53:01 AM By: Lorine Bears RCP, RRT, CHT Entered By: Lorine Bears on 06/23/2019 10:06:14

## 2019-06-23 NOTE — Progress Notes (Signed)
ROSHON, [name] ([id number]) Visit Report for 06/23/2019 Problem List Details Patient Name: [name], [name] Date of Service: 06/23/2019 8:00 AM Medical Record Number: [id number] Patient Account Number: [id number] Date of Birth/Sex: 1951/03/02 (68 y.o. 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, sequela I10 Essential  (primary) hypertension Facility Procedures CPT4 Code: [id number] Description: (Facility Use Only) HBOT, full body chamber, 9min Modifier: Quantity: 4 Physician Procedures CPT4 Code: [id number] Description: [id number] - WC PHYS HYPERBARIC OXYGEN THERAPY ICD-10 Diagnosis Description M86.68 Other chronic osteomyelitis, other site L89.314 Pressure ulcer of right buttock, stage 4 L03.317 Cellulitis of buttock 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

## 2019-06-24 ENCOUNTER — Other Ambulatory Visit: Payer: Self-pay

## 2019-06-24 ENCOUNTER — Encounter: Payer: Medicare Other | Admitting: Physician Assistant

## 2019-06-24 DIAGNOSIS — L89314 Pressure ulcer of right buttock, stage 4: Secondary | ICD-10-CM | POA: Diagnosis not present

## 2019-06-24 NOTE — Progress Notes (Signed)
[name], [name] ([id number]) Visit Report for 06/24/2019 Arrival Information Details Patient Name: [name], [name] Date of Service: 06/24/2019 8:00 AM Medical Record Number: [id number] Patient Account Number: [id number] Date of Birth/Sex: [birth year] (68 y.o. M) Treating RN: Montey Hora Primary Care Laikynn Pollio: [name] Other Clinician: [name] Referring Rosielee Corporan: [name] Treating Sterlin Knightly/Extender: [name], HOYT Weeks in Treatment: 20 Visit Information History Since Last Visit Added or deleted any medications: No Patient Arrived: Wheel Chair Any new allergies or adverse reactions: No Arrival Time: 08:05 Had a fall or experienced change in No Accompanied By: wife activities of daily living that may affect Transfer Assistance: Harrel Lemon Lift risk of falls: Patient Identification Verified: Yes Signs or symptoms of abuse/neglect since last visito No Secondary Verification Process Completed: Yes Hospitalized since last visit: No Patient Requires Transmission-Based No Implantable device outside of the clinic excluding No Precautions: cellular tissue based products placed in the center Patient Has Alerts: Yes since last visit: Patient Alerts: NOT Has Dressing in Place as Prescribed: Yes Diabetic Pain Present Now: No Electronic Signature(s) Signed: 06/24/2019 10:51:51 AM By: Lorine Bears RCP, RRT, CHT Entered By: Lorine Bears on 06/24/2019 08:30:21 [name], [name] ([id number]) -------------------------------------------------------------------------------- Encounter Discharge Information Details Patient Name: [name] Date of Service: 06/24/2019 8:00 AM Medical Record Number: [id number] Patient Account Number: [id number] Date of Birth/Sex: Feb 10, 1951 (68 y.o. M) Treating RN: Montey Hora Primary Care Lealand Elting: [name] Other Clinician: [name] Referring Evanell Redlich: [name] Treating Miyani Cronic/Extender: [name], HOYT Weeks in Treatment: 67 Encounter Discharge Information Items Discharge Condition: Stable Ambulatory Status: Wheelchair Discharge Destination: Home Transportation: Private Auto Accompanied By: wife Schedule Follow-up Appointment: Yes Clinical Summary of Care: Notes Patient has an HBO treatment scheduled on 06/28/2019 at 08:00 am. Electronic Signature(s) Signed: 06/24/2019 10:51:51 AM By: Lorine Bears RCP, RRT, CHT Entered By: Lorine Bears on 06/24/2019 10:51:31 [name], [name] ([id number]) -------------------------------------------------------------------------------- Vitals Details Patient Name: [name] Date of Service: 06/24/2019 8:00 AM Medical Record Number: [id number] Patient Account Number: [id number] Date of Birth/Sex: February 04, 1951 (68 y.o. M) Treating RN: Montey Hora Primary Care Menucha Dicesare: [name] Other Clinician: [name] Referring Loann Chahal: [name] Treating Taelar Gronewold/Extender: [name], HOYT Weeks in Treatment: 46 Vital Signs Time Taken: 08:17 Temperature (F): 98.7 Height (in): 73 Pulse (bpm): 60 Weight (lbs): 210 Respiratory Rate (breaths/min): 16 Body Mass Index (BMI): 27.7 Blood Pressure (mmHg): 112/62 Reference Range: 80 - 120 mg / dl Electronic Signature(s) Signed: 06/24/2019 10:51:51 AM By: Lorine Bears RCP, RRT, CHT Entered By: Lorine Bears on 06/24/2019 08:30:45

## 2019-06-25 NOTE — Progress Notes (Signed)
[name], Brett ([id number]) Visit Report for 06/24/2019 HBO Details Patient Name: [name], [name] Date of Service: 06/24/2019 8:00 AM Medical Record Number: [id number] Patient Account Number: [id number] Date of Birth/Sex: 1951-03-01 (68 y.o. 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 HBO Treatment Details Treatment Number: 44 Patient Type: Outpatient Chamber Type: Monoplace Chamber Serial #: [id number] Treatment Protocol: 2.0 ATA with 90 minutes oxygen, and no air breaks Treatment Details Compression Rate Down: 1.5 psi / minute De-Compression Rate Up: 1.5 psi / minute Compress Tx Pressure Air breaks and breathing periods Decompress Decompress Begins Reached (leave unused spaces blank) Begins Ends Chamber Pressure (ATA) 1 2 - - - - - - 2 1 Clock Time (24 hr) 08:23 08:34 - - - - - - 10:04 10:14 Treatment Length: 111 (minutes) Treatment Segments: 4 Vital Signs Capillary Blood Glucose Reference Range: 80 - 120 mg / dl HBO Diabetic Blood Glucose Intervention Range: <131 mg/dl or >249 mg/dl Time Vitals Blood Respiratory Capillary Blood Glucose Pulse Action Type: Pulse: Temperature: Taken: Pressure: Rate: Glucose (mg/dl): Meter #: Oximetry (%) Taken: Pre 08:17 112/62 60 16 98.7 Post 10:16 122/80 60 16 98.4 Treatment Response Treatment Toleration: Well Treatment Completion Treatment Completed without Adverse Event Status: Electronic Signature(s) Signed: 06/24/2019 10:51:51 AM By: Lorine Bears RCP, RRT, CHT Signed: 06/25/2019 11:37:39 PM By: Worthy Keeler  [name] Entered By: Lorine Bears on 06/24/2019 10:50:18 LEONIDAS, DUNSTAN ([id number]) -------------------------------------------------------------------------------- HBO Safety Checklist Details Patient Name: [name] Date of Service: 06/24/2019 8:00 AM Medical Record Number: [id number] Patient Account Number: [id number] Date of Birth/Sex: [birth year] (68 y.o. M) Treating RN: Montey Hora Primary Care Lovette Merta: [name] Other Clinician: [name] Referring Lyndie Vanderloop: [name] Treating Keia Rask/Extender: [name], HOYT Weeks in Treatment: 81 HBO Safety Checklist Items Safety Checklist Consent Form Signed Patient voided / foley secured and emptied When did you last eato morning Last dose of injectable or oral agent n/a Ostomy pouch emptied and vented if applicable NA Medtronic Baclofen Pump; Nevro All implantable devices assessed, documented and approved Spinal Stimulator Intravenous access site secured and place NA Valuables secured Linens and cotton and cotton/polyester blend (less than 51% polyester) Personal oil-based products / skin lotions / body lotions removed Wigs or hairpieces removed NA Smoking or tobacco materials removed NA Books / newspapers / magazines / loose paper removed Cologne, aftershave, perfume and deodorant removed Jewelry removed (may wrap wedding band) Make-up removed NA Hair care products removed Battery operated devices (external) removed NPWT pump Heating patches and chemical warmers removed Titanium eyewear removed NA Nail polish cured greater than 10 hours NA Casting material cured greater than 10 hours NA Hearing aids removed NA Loose dentures or partials removed NA Prosthetics have been removed NA Patient demonstrates correct use of air break device (if applicable) Patient concerns have been addressed Patient grounding bracelet on and cord attached to chamber Specifics for  Inpatients (complete in addition to above) Medication sheet sent with patient Intravenous medications needed or due during therapy sent with patient Drainage tubes (e.g. nasogastric tube or chest tube secured and vented) Endotracheal or Tracheotomy tube secured Cuff deflated of air and inflated with saline Airway suctioned Electronic Signature(s) TRENT, BLONSKI ([id number]) Signed:  06/24/2019 10:51:51 AM By: Lorine Bears RCP, RRT, CHT Entered By: Lorine Bears on 06/24/2019 08:31:44

## 2019-06-27 ENCOUNTER — Ambulatory Visit: Payer: Medicare Other | Admitting: Physician Assistant

## 2019-06-28 ENCOUNTER — Ambulatory Visit: Payer: Medicare Other | Admitting: Physician Assistant

## 2019-06-28 ENCOUNTER — Encounter: Payer: Medicare Other | Admitting: Physician Assistant

## 2019-06-29 ENCOUNTER — Other Ambulatory Visit: Payer: Self-pay | Admitting: Pain Medicine

## 2019-06-29 ENCOUNTER — Ambulatory Visit: Payer: Medicare Other | Admitting: Internal Medicine

## 2019-06-29 ENCOUNTER — Encounter: Payer: Medicare Other | Admitting: Internal Medicine

## 2019-06-29 ENCOUNTER — Other Ambulatory Visit: Payer: Self-pay

## 2019-06-29 DIAGNOSIS — M961 Postlaminectomy syndrome, not elsewhere classified: Secondary | ICD-10-CM

## 2019-06-29 DIAGNOSIS — L89314 Pressure ulcer of right buttock, stage 4: Secondary | ICD-10-CM | POA: Diagnosis not present

## 2019-06-29 DIAGNOSIS — M549 Dorsalgia, unspecified: Secondary | ICD-10-CM

## 2019-06-29 NOTE — Progress Notes (Signed)
BIENVENIDO, NOVOSEL ([id number]) Visit Report for 06/29/2019 Arrival Information Details Patient Name: ZYLIN, KUHNKE Date of Service: 06/29/2019 8:00 AM Medical Record Number: [id number] Patient Account Number: [id number] Date of Birth/Sex: 30-Aug-1950 (68 y.o. M) Treating RN: [name] Primary Care Lilias Lorensen: [name] Other Clinician: [name] Referring Zadia Uhde: [name] Treating Sherice Ijames/Extender: [name] in Treatment: 72 Visit Information History Since Last Visit Added or deleted any medications: No Patient Arrived: Wheel Chair Any new allergies or adverse reactions: No Arrival Time: 08:05 Had a fall or experienced change in No Accompanied By: wife activities of daily living that may affect Transfer Assistance: Harrel Lemon Lift risk of falls: Patient Identification Verified: Yes Signs or symptoms of abuse/neglect since last visito No Secondary Verification Process Completed: Yes Hospitalized since last visit: No Patient Requires Transmission-Based No Implantable device outside of the clinic excluding No Precautions: cellular tissue based products placed in the center Patient Has Alerts: Yes since last visit: Patient Alerts: NOT Has Dressing in Place as Prescribed: Yes Diabetic Pain Present Now: No Electronic Signature(s) Signed: 06/29/2019 4:12:53 PM By: Lorine Bears RCP, RRT, CHT Entered By: Lorine Bears on 06/29/2019 09:24:31 SONG, GENNUSA ([id number]) -------------------------------------------------------------------------------- Encounter Discharge Information Details Patient Name: [name] Date of Service: 06/29/2019 8:00 AM Medical Record Number: [id number] Patient Account Number: [id number] Date of Birth/Sex: 08-Dec-1950 (68 y.o. M) Treating RN: [name] Primary Care Marguetta Windish: [name] Other Clinician: [name] Referring Conal Shetley: [name] Treating  Berdella Bacot/Extender: [name] in Treatment: 24 Encounter Discharge Information Items Discharge Condition: Stable Ambulatory Status: Wheelchair Discharge Destination: Home Transportation: Private Auto Accompanied By: wife Schedule Follow-up Appointment: Yes Clinical Summary of Care: Notes Patient has an HBO treatment scheduled on 06/30/2019 at 08:00 am. Electronic Signature(s) Signed: 06/29/2019 4:12:53 PM By: Lorine Bears RCP, RRT, CHT Entered By: Lorine Bears on 06/29/2019 12:39:56 COURTEZ, MAYERNIK ([id number]) -------------------------------------------------------------------------------- Vitals Details Patient Name: [name] Date of Service: 06/29/2019 8:00 AM Medical Record Number: [id number] Patient Account Number: [id number] Date of Birth/Sex: May 22, 1951 (68 y.o. M) Treating RN: [name] Primary Care Shannen Flansburg: [name] Other Clinician: [name] Referring Alynn Ellithorpe: [name] Treating Maddelyn Rocca/Extender: [name] in Treatment: 13 Vital Signs Time Taken: 08:19 Temperature (F): 98.7 Height (in): 73 Pulse (bpm): 66 Weight (lbs): 210 Respiratory Rate (breaths/min): 16 Body Mass Index (BMI): 27.7 Blood Pressure (mmHg): 106/68 Reference Range: 80 - 120 mg / dl Electronic Signature(s) Signed: 06/29/2019 4:12:53 PM By: Lorine Bears RCP, RRT, CHT Entered By: Lorine Bears on 06/29/2019 09:24:57

## 2019-06-30 ENCOUNTER — Encounter: Payer: Medicare Other | Admitting: Physician Assistant

## 2019-06-30 DIAGNOSIS — L89314 Pressure ulcer of right buttock, stage 4: Secondary | ICD-10-CM | POA: Diagnosis not present

## 2019-06-30 NOTE — Progress Notes (Signed)
TUPAC, SUNDE ([id number]) Visit Report for 06/30/2019 Arrival Information Details Patient Name: [name], [name] Date of Service: 06/30/2019 8:00 AM Medical Record Number: [id number] Patient Account Number: [id number] Date of Birth/Sex: 07/26/1950 (68 y.o. M) Treating RN: Army Melia Primary Care Nashia Remus: [name] Other Clinician: [name] Referring Karine Garn: [name] Treating Kerri-Anne Haeberle/Extender: [name], HOYT Weeks in Treatment: 18 Visit Information History Since Last Visit Added or deleted any medications: No Patient Arrived: Wheel Chair Any new allergies or adverse reactions: No Arrival Time: 08:05 Had a fall or experienced change in No Accompanied By: wife activities of daily living that may affect Transfer Assistance: Harrel Lemon Lift risk of falls: Patient Identification Verified: Yes Signs or symptoms of abuse/neglect since last visito No Secondary Verification Process Completed: Yes Hospitalized since last visit: No Patient Requires Transmission-Based No Implantable device outside of the clinic excluding No Precautions: cellular tissue based products placed in the center Patient Has Alerts: Yes since last visit: Patient Alerts: NOT Has Dressing in Place as Prescribed: Yes Diabetic Pain Present Now: No Electronic Signature(s) Signed: 06/30/2019 10:29:26 AM By: Lorine Bears RCP, RRT, CHT Entered By: Lorine Bears on 06/30/2019 08:30:22 [name], [name] ([id number]) -------------------------------------------------------------------------------- Encounter Discharge Information Details Patient Name: [name] Date of Service: 06/30/2019 8:00 AM Medical Record Number: [id number] Patient Account Number: [id number] Date of Birth/Sex: 03/09/1951 (68 y.o. M) Treating RN: Army Melia Primary Care Yaeli Hartung: [name] Other Clinician: [name] Referring Sharryn Belding: [name] Treating Carin Shipp/Extender: [name], HOYT Weeks in Treatment: 77 Encounter Discharge Information Items Discharge Condition: Stable Ambulatory Status: Wheelchair Discharge Destination: Home Transportation: Private Auto Accompanied By: wife Schedule Follow-up Appointment: Yes Clinical Summary of Care: Notes Patient has an HBO treatment scheduled on 07/01/2019 at 08:00 am. Electronic Signature(s) Signed: 06/30/2019 10:29:26 AM By: Lorine Bears RCP, RRT, CHT Entered By: Lorine Bears on 06/30/2019 10:29:10 [name], [name] ([id number]) -------------------------------------------------------------------------------- Vitals Details Patient Name: [name] Date of Service: 06/30/2019 8:00 AM Medical Record Number: [id number] Patient Account Number: [id number] Date of Birth/Sex: 12-Sep-1950 (68 y.o. M) Treating RN: Army Melia Primary Care Arabel Barcenas: [name] Other Clinician: [name] Referring Kyro Joswick: [name] Treating Donnalyn Juran/Extender: [name], HOYT Weeks in Treatment: 39 Vital Signs Time Taken: 08:18 Temperature (F): 98.2 Height (in): 73 Pulse (bpm): 72 Weight (lbs): 210 Respiratory Rate (breaths/min): 16 Body Mass Index (BMI): 27.7 Blood Pressure (mmHg): 104/64 Reference Range: 80 - 120 mg / dl Electronic Signature(s) Signed: 06/30/2019 10:29:26 AM By: Lorine Bears RCP, RRT, CHT Entered By: Lorine Bears on 06/30/2019 08:30:58

## 2019-06-30 NOTE — Progress Notes (Signed)
NAVION, DEFRAIN ([id number]) Visit Report for 06/29/2019 HBO Details Patient Name: [name], [name] Date of Service: 06/29/2019 8:00 AM Medical Record Number: [id number] Patient Account Number: [id number] Date of Birth/Sex: [birth year] (68 y.o. 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 HBO Treatment Details Treatment Number: 56 Patient Type: Outpatient Chamber Type: Monoplace Chamber Serial #: [id number] Treatment Protocol: 2.0 ATA with 90 minutes oxygen, and no air breaks Treatment Details Compression Rate Down: 1.5 psi / minute De-Compression Rate Up: 2.0 psi / minute Compress Tx Pressure Air breaks and breathing periods Decompress Decompress Begins Reached (leave unused spaces blank) Begins Ends Chamber Pressure (ATA) 1 2 - - - - - - 2 1 Clock Time (24 hr) 08:24 08:35 - - - - - - 10:06 10:14 Treatment Length: 110 (minutes) Treatment Segments: 4 Vital Signs Capillary Blood Glucose Reference Range: 80 - 120 mg / dl HBO Diabetic Blood Glucose Intervention Range: <131 mg/dl or >249 mg/dl Time Vitals Blood Respiratory Capillary Blood Glucose Pulse Action Type: Pulse: Temperature: Taken: Pressure: Rate: Glucose (mg/dl): Meter #: Oximetry (%) Taken: Pre 08:19 106/68 66 16 98.7 Post 10:18 122/82 84 16 97.9 Treatment Response Treatment Toleration: Well Treatment Completion Treatment Completed without Adverse Event Status: [name] Notes No concerns with treatment given HBO Attestation I certify that I supervised this HBO treatment in accordance with Medicare guidelines. A  trained emergency response Yes team is readily available per hospital policies and procedures. Continue HBOT as ordered. [address] [name] ([id number]) Electronic Signature(s) Signed: 06/29/2019 6:09:55 PM By: Brett Ham MD Previous Signature: 06/29/2019 4:12:53 PM Version By: [name] RCP, RRT, CHT Entered By: [name] on 06/29/2019 18:09:13 [name] ([id number]) -------------------------------------------------------------------------------- HBO Safety Checklist Details Patient Name: [name] Date of Service: 06/29/2019 8:00 AM Medical Record Number: [id number] Patient Account Number: [id number] Date of Birth/Sex: [birth year] (68 y.o. M) Treating RN: [name] Primary Care Tianni Escamilla: [name] Other Clinician: [name] Referring Bailee Metter: [name] Treating Guadalupe Nickless/Extender: [name] in Treatment: 43 HBO Safety Checklist Items Safety Checklist Consent Form Signed Patient voided / foley secured and emptied When did you last eato morning Last dose of injectable or oral agent n/a Ostomy pouch emptied and vented if applicable NA Medtronic Baclofen Pump; Nevro All implantable devices assessed, documented and approved Spinal Stimulator Intravenous access site secured and place NA Valuables secured Linens and cotton and cotton/polyester blend (less than 51% polyester) Personal oil-based products / skin lotions / body lotions removed Wigs or hairpieces removed NA Smoking or tobacco materials removed NA Books / newspapers / magazines / loose paper removed Cologne, aftershave, perfume and deodorant removed Jewelry removed (may wrap wedding band) Make-up removed NA Hair care products removed Battery operated devices (external) removed NPWT pump Heating patches and chemical warmers removed Titanium eyewear removed NA Nail polish cured greater than 10 hours NA Casting material cured  greater than 10 hours NA Hearing aids removed NA Loose dentures or partials removed NA Prosthetics have been removed NA Patient demonstrates correct use of air break device (if applicable) Patient concerns have been addressed Patient grounding bracelet on and cord attached to chamber Specifics for Inpatients (complete in addition to above) Medication sheet sent  with patient Intravenous medications needed or due during therapy sent with patient Drainage tubes (e.g. nasogastric tube or chest tube secured and vented) Endotracheal or Tracheotomy tube secured Cuff deflated of air and inflated with saline Airway suctioned Electronic Signature(s) [name] ([name]) Signed: 06/29/2019 4:12:53 PM By: [name] RCP, RRT, CHT Entered By: [name] on 06/29/2019 09:26:10

## 2019-07-01 ENCOUNTER — Other Ambulatory Visit: Payer: Self-pay

## 2019-07-01 ENCOUNTER — Encounter: Payer: Medicare Other | Admitting: Physician Assistant

## 2019-07-01 DIAGNOSIS — L89314 Pressure ulcer of right buttock, stage 4: Secondary | ICD-10-CM | POA: Diagnosis not present

## 2019-07-01 NOTE — Progress Notes (Signed)
CURITS, VINING ([id number]) Visit Report for 06/30/2019 HBO Details Patient Name: [name], [name] Date of Service: 06/30/2019 8:00 AM Medical Record Number: [id number] Patient Account Number: [id number] Date of Birth/Sex: 1951-02-12 (68 y.o. 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 HBO Treatment Details Treatment Number: 46 Patient Type: Outpatient Chamber Type: Monoplace Chamber Serial #: [id number] Treatment Protocol: 2.0 ATA with 90 minutes oxygen, and no air breaks Treatment Details Compression Rate Down: 1.5 psi / minute De-Compression Rate Up: 2.0 psi / minute Compress Tx Pressure Air breaks and breathing periods Decompress Decompress Begins Reached (leave unused spaces blank) Begins Ends Chamber Pressure (ATA) 1 2 - - - - - - 2 1 Clock Time (24 hr) 08:23 08:35 - - - - - - 10:05 10:13 Treatment Length: 110 (minutes) Treatment Segments: 4 Vital Signs Capillary Blood Glucose Reference Range: 80 - 120 mg / dl HBO Diabetic Blood Glucose Intervention Range: <131 mg/dl or >249 mg/dl Time Vitals Blood Respiratory Capillary Blood Glucose Pulse Action Type: Pulse: Temperature: Taken: Pressure: Rate: Glucose (mg/dl): Meter #: Oximetry (%) Taken: Pre 08:18 104/64 72 16 98.2 Post 10:20 122/64 60 16 98 Treatment Response Treatment Toleration: Well Treatment Completion Treatment Completed without Adverse Event Status: HBO Attestation I certify that I supervised this HBO treatment in accordance with Medicare guidelines. A trained emergency response Yes team is readily  available per hospital policies and procedures. Continue HBOT as ordered. Yes Electronic Signature(s) ASTEN, PELICAN ([id number]) Signed: 06/30/2019 6:17:54 PM By: Worthy Keeler [name] Previous Signature: 06/30/2019 10:29:26 AM Version By: Lorine Bears RCP, RRT, CHT Entered By: Worthy Keeler on 06/30/2019 18:17:54 DRIN, KISHIMOTO ([id number]) -------------------------------------------------------------------------------- HBO Safety Checklist Details Patient Name: [name] Date of Service: 06/30/2019 8:00 AM Medical Record Number: [id number] Patient Account Number: [id number] Date of Birth/Sex: [birth year] (68 y.o. M) Treating RN: Army Melia Primary Care Mikaelyn Arthurs: [name] Other Clinician: [name] Referring Samiah Ricklefs: [name] Treating Lexii Walsh/Extender: [name], HOYT Weeks in Treatment: 82 HBO Safety Checklist Items Safety Checklist Consent Form Signed Patient voided / foley secured and emptied When did you last eato morning Last dose of injectable or oral agent n/a Ostomy pouch emptied and vented if applicable NA Medtronic Baclofen Pump; Nevro All implantable devices assessed, documented and approved Spinal Stimulator Intravenous access site secured and place NA Valuables secured Linens and cotton and cotton/polyester blend (less than 51% polyester) Personal oil-based products / skin lotions / body lotions removed Wigs or hairpieces removed NA Smoking or tobacco materials removed NA Books / newspapers / magazines / loose paper removed Cologne, aftershave, perfume and deodorant removed Jewelry removed (may wrap wedding band) Make-up removed NA Hair care products removed Battery operated devices (external) removed NPWT pump Heating patches and chemical warmers removed Titanium eyewear removed NA Nail polish cured greater than 10 hours NA Casting material cured greater than 10 hours NA Hearing aids  removed NA Loose dentures or partials removed NA Prosthetics have been removed NA Patient demonstrates correct use of air break device (if applicable) Patient concerns have been addressed Patient grounding bracelet on and cord attached to chamber Specifics for Inpatients (complete in addition to above) Medication sheet sent with patient Intravenous medications needed  or due during therapy sent with patient Drainage tubes (e.g. nasogastric tube or chest tube secured and vented) Endotracheal or Tracheotomy tube secured Cuff deflated of air and inflated with saline Airway suctioned Electronic Signature(s) CALCIFER, SCHWEITZER ([id number]) Signed: 06/30/2019 10:29:26 AM By: Lorine Bears RCP, RRT, CHT Entered By: Lorine Bears on 06/30/2019 08:32:15

## 2019-07-01 NOTE — Progress Notes (Signed)
RIO, GIAMMANCO ([id number]) Visit Report for 07/01/2019 HBO Details Patient Name: [name], [name] Date of Service: 07/01/2019 8:00 AM Medical Record Number: [id number] Patient Account Number: [id number] Date of Birth/Sex: [birth year] (68 y.o. 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 HBO Treatment Details Treatment Number: 47 Patient Type: Outpatient Chamber Type: Monoplace Chamber Serial #: [id number] Treatment Protocol: 2.0 ATA with 90 minutes oxygen, and no air breaks Treatment Details Compression Rate Down: 1.5 psi / minute De-Compression Rate Up: 2.0 psi / minute Compress Tx Pressure Air breaks and breathing periods Decompress Decompress Begins Reached (leave unused spaces blank) Begins Ends Chamber Pressure (ATA) 1 2 - - - - - - 2 1 Clock Time (24 hr) 08:20 08:32 - - - - - - 10:03 10:10 Treatment Length: 110 (minutes) Treatment Segments: 4 Vital Signs Capillary Blood Glucose Reference Range: 80 - 120 mg / dl HBO Diabetic Blood Glucose Intervention Range: <131 mg/dl or >249 mg/dl Time Vitals Blood Respiratory Capillary Blood Glucose Pulse Action Type: Pulse: Temperature: Taken: Pressure: Rate: Glucose (mg/dl): Meter #: Oximetry (%) Taken: Pre 08:16 112/70 60 16 98.2 Post 10:14 110/62 66 16 97.8 Treatment Response Treatment Toleration: Well Treatment Completion Treatment Completed without Adverse Event Status: HBO Attestation I certify that I supervised this HBO treatment in accordance with Medicare guidelines. A trained emergency response Yes team is readily  available per hospital policies and procedures. Continue HBOT as ordered. Yes Electronic Signature(s) UNK, LYBECK ([id number]) Signed: 07/01/2019 11:50:07 AM By: Worthy Keeler [name] Previous Signature: 07/01/2019 10:21:12 AM Version By: Lorine Bears RCP, RRT, CHT Entered By: Worthy Keeler on 07/01/2019 11:50:06 RIOTT, FULLER ([id number]) -------------------------------------------------------------------------------- HBO Safety Checklist Details Patient Name: [name] Date of Service: 07/01/2019 8:00 AM Medical Record Number: [id number] Patient Account Number: [id number] Date of Birth/Sex: 02-03-1951 (68 y.o. M) Treating RN: Montey Hora Primary Care Seaver Machia: [name] Other Clinician: [name] Referring Rustin Erhart: [name] Treating Sanjna Haskew/Extender: [name], HOYT Weeks in Treatment: 82 HBO Safety Checklist Items Safety Checklist Consent Form Signed Patient voided / foley secured and emptied When did you last eato morning Last dose of injectable or oral agent n/a Ostomy pouch emptied and vented if applicable NA Medtronic Baclofen Pump; Nevro All implantable devices assessed, documented and approved Spinal Stimulator Intravenous access site secured and place NA Valuables secured Linens and cotton and cotton/polyester blend (less than 51% polyester) Personal oil-based products / skin lotions / body lotions removed Wigs or hairpieces removed NA Smoking or tobacco materials removed NA Books / newspapers / magazines / loose paper removed Cologne, aftershave, perfume and deodorant removed Jewelry removed (may wrap wedding band) Make-up removed NA Hair care products removed Battery operated devices (external) removed NPWT pump Heating patches and chemical warmers removed NA Titanium eyewear removed NA Nail polish cured greater than 10 hours NA Casting material cured greater than 10 hours NA Hearing  aids removed NA Loose dentures or partials removed NA Prosthetics have been removed NA Patient demonstrates correct use of air break device (if applicable) Patient concerns have been addressed Patient grounding bracelet on and cord attached to chamber Specifics for Inpatients (complete in addition to above) Medication sheet sent with patient Intravenous medications  needed or due during therapy sent with patient Drainage tubes (e.g. nasogastric tube or chest tube secured and vented) Endotracheal or Tracheotomy tube secured Cuff deflated of air and inflated with saline Airway suctioned Electronic Signature(s) MARGO, NAULA ([id number]) Signed: 07/01/2019 10:21:12 AM By: Lorine Bears RCP, RRT, CHT Entered By: Lorine Bears on 07/01/2019 08:57:22

## 2019-07-01 NOTE — Progress Notes (Signed)
[name], [name] ([id number]) Visit Report for 07/01/2019 Problem List Details Patient Name: [name], [name] Date of Service: 07/01/2019 8:00 AM Medical Record Number: [id number] Patient Account Number: [id number] Date of Birth/Sex: [birth year] (68 y.o. 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, sequela I10  Essential (primary) hypertension Facility Procedures CPT4 Code: [id number] Description: (Facility Use Only) HBOT, full body chamber, 40min Modifier: Quantity: 4 Physician Procedures CPT4 Code: [id number] Description: [id number] - WC PHYS HYPERBARIC OXYGEN THERAPY ICD-10 Diagnosis Description M86.68 Other chronic osteomyelitis, other site L89.314 Pressure ulcer of right buttock, stage 4 L03.317 Cellulitis of buttock G82.20 Paraplegia, unspecified Modifier: Quantity: 1 Electronic Signature(s) Signed: 07/01/2019 11:50:13 AM By: Worthy Keeler [name] Previous Signature: 07/01/2019 10:21:12 AM Version By: Lorine Bears RCP, RRT, CHT Entered By: Worthy Keeler on 07/01/2019 11:50:12

## 2019-07-01 NOTE — Progress Notes (Signed)
[name], [name] ([id number]) Visit Report for 07/01/2019 Arrival Information Details Patient Name: [name], [name] Date of Service: 07/01/2019 8:00 AM Medical Record Number: [id number] Patient Account Number: [id number] Date of Birth/Sex: 12-14-1950 (68 y.o. M) Treating RN: Montey Hora Primary Care Nasir Bright: [name] Other Clinician: [name] Referring Algenis Ballin: [name] Treating Gwenette Wellons/Extender: [name], HOYT Weeks in Treatment: 28 Visit Information History Since Last Visit Added or deleted any medications: No Patient Arrived: Wheel Chair Any new allergies or adverse reactions: No Arrival Time: 08:54 Had a fall or experienced change in No Accompanied By: wife activities of daily living that may affect Transfer Assistance: Harrel Lemon Lift risk of falls: Patient Identification Verified: Yes Signs or symptoms of abuse/neglect since last visito No Secondary Verification Process Completed: Yes Hospitalized since last visit: No Patient Requires Transmission-Based No Implantable device outside of the clinic excluding No Precautions: cellular tissue based products placed in the center Patient Has Alerts: Yes since last visit: Patient Alerts: NOT Has Dressing in Place as Prescribed: Yes Diabetic Pain Present Now: No Electronic Signature(s) Signed: 07/01/2019 10:21:12 AM By: Lorine Bears RCP, RRT, CHT Entered By: Lorine Bears on 07/01/2019 08:55:10 [name], [name] ([id number]) -------------------------------------------------------------------------------- Encounter Discharge Information Details Patient Name: [name] Date of Service: 07/01/2019 8:00 AM Medical Record Number: [id number] Patient Account Number: [id number] Date of Birth/Sex: [birth year] (68 y.o. M) Treating RN: Montey Hora Primary Care Hala Narula: [name] Other Clinician: [name] Referring Timon Geissinger: [name] Treating Meilani Edmundson/Extender: [name], HOYT Weeks in Treatment: 33 Encounter Discharge Information Items Discharge Condition: Stable Ambulatory Status: Wheelchair Discharge Destination: Home Transportation: Private Auto Accompanied By: daughter, Joelene Millin Schedule Follow-up Appointment: Yes Clinical Summary of Care: Notes Patient has an HBO treatment scheduled on 07/04/2019 at 08:00 am. Electronic Signature(s) Signed: 07/01/2019 10:21:12 AM By: Lorine Bears RCP, RRT, CHT Entered By: Lorine Bears on 07/01/2019 10:20:49 [name], [name] ([id number]) -------------------------------------------------------------------------------- Vitals Details Patient Name: [name] Date of Service: 07/01/2019 8:00 AM Medical Record Number: [id number] Patient Account Number: [id number] Date of Birth/Sex: 14-Feb-1951 (68 y.o. M) Treating RN: Montey Hora Primary Care Rosa Gambale: [name] Other Clinician: [name] Referring Hailyn Zarr: [name] Treating Everleigh Colclasure/Extender: [name], HOYT Weeks in Treatment: 58 Vital Signs Time Taken: 08:16 Temperature (F): 98.2 Height (in): 73 Pulse (bpm): 60 Weight (lbs): 210 Respiratory Rate (breaths/min): 16 Body Mass Index (BMI): 27.7 Blood Pressure (mmHg): 112/70 Reference Range: 80 - 120 mg / dl Electronic Signature(s) Signed: 07/01/2019 10:21:12 AM By: Lorine Bears RCP, RRT, CHT Entered By: Lorine Bears on 07/01/2019 08:56:10

## 2019-07-01 NOTE — Progress Notes (Signed)
[name], [name] ([id number]) Visit Report for 06/30/2019 Problem List Details Patient Name: [name], [name] Date of Service: 06/30/2019 8:00 AM Medical Record Number: [id number] Patient Account Number: [id number] Date of Birth/Sex: 1950/08/04 (68 y.o. 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, sequela I10  Essential (primary) hypertension Facility Procedures CPT4 Code: [id number] Description: (Facility Use Only) HBOT, full body chamber, 11min Modifier: Quantity: 4 Physician Procedures CPT4 Code: [id number] Description: [id number] - WC PHYS HYPERBARIC OXYGEN THERAPY ICD-10 Diagnosis Description M86.68 Other chronic osteomyelitis, other site L89.314 Pressure ulcer of right buttock, stage 4 L03.317 Cellulitis of buttock G82.20 Paraplegia, unspecified Modifier: Quantity: 1 Electronic Signature(s) Signed: 06/30/2019 6:18:00 PM By: Worthy Keeler [name] Previous Signature: 06/30/2019 10:29:26 AM Version By: Lorine Bears RCP, RRT, CHT Entered By: Worthy Keeler on 06/30/2019 18:17:59

## 2019-07-04 ENCOUNTER — Encounter: Payer: Medicare Other | Admitting: Physician Assistant

## 2019-07-04 ENCOUNTER — Other Ambulatory Visit: Payer: Self-pay

## 2019-07-04 DIAGNOSIS — L89314 Pressure ulcer of right buttock, stage 4: Secondary | ICD-10-CM | POA: Diagnosis not present

## 2019-07-04 NOTE — Progress Notes (Addendum)
[name], [name] ([id number]) Visit Report for 07/04/2019 HBO Details Patient Name: [name], [name] Date of Service: 07/04/2019 8:00 AM Medical Record Number: [id number] Patient Account Number: [id number] Date of Birth/Sex: [birth year] (68 y.o. 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 HBO Treatment Details Treatment Number: 48 Patient Type: Outpatient Chamber Type: Monoplace Chamber Serial #: [id number] Treatment Protocol: 2.0 ATA with 90 minutes oxygen, and no air breaks Treatment Details Compression Rate Down: 1.5 psi / minute De-Compression Rate Up: 1.5 psi / minute Compress Tx Pressure Air breaks and breathing periods Decompress Decompress Begins Reached (leave unused spaces blank) Begins Ends Chamber Pressure (ATA) 1 2 - - - - - - 2 1 Clock Time (24 hr) 08:57 09:08 - - - - - - 10:39 10:50 Treatment Length: 113 (minutes) Treatment Segments: 4 Vital Signs Capillary Blood Glucose Reference Range: 80 - 120 mg / dl HBO Diabetic Blood Glucose Intervention Range: <131 mg/dl or >249 mg/dl Time Vitals Blood Respiratory Capillary Blood Glucose Pulse Action Type: Pulse: Temperature: Taken: Pressure: Rate: Glucose (mg/dl): Meter #: Oximetry (%) Taken: Pre 08:31 142/80 84 16 98.9 Post 10:55 126/72 64 16 98.1 Treatment Response Treatment Toleration: Well Treatment Completion Treatment Completed without Adverse Event Status: HBO Attestation I certify that I supervised this HBO treatment in accordance with Medicare guidelines. A trained emergency response Yes team is readily  available per hospital policies and procedures. Continue HBOT as ordered. Yes Electronic Signature(s) [name], [name] ([id number]) Signed: 07/04/2019 5:29:40 PM By: Worthy Keeler [name] Previous Signature: 07/04/2019 11:00:19 AM Version By: Montey Hora Previous Signature: 07/04/2019 10:54:21 AM Version By: Army Melia Previous Signature: 07/04/2019 9:18:08 AM Version By: Montey Hora Entered By: Worthy Keeler on 07/04/2019 17:29:40 [name], [name] ([id number]) -------------------------------------------------------------------------------- HBO Safety Checklist Details Patient Name: [name] Date of Service: 07/04/2019 8:00 AM Medical Record Number: [id number] Patient Account Number: [id number] Date of Birth/Sex: Jul 30, 1950 (68 y.o. M) Treating RN: Montey Hora Primary Care Maleeyah Mccaughey: [name] Other Clinician: [name] Referring Tiphani Mells: [name] Treating Treasure Ochs/Extender: [name], HOYT Weeks in Treatment: 2 HBO Safety Checklist Items Safety Checklist Consent Form Signed Patient voided / foley secured and emptied When did you last eato 07/04/19 AM Last dose of injectable or oral agent n/a Ostomy pouch emptied and vented if applicable NA All implantable devices assessed, documented and approved NA Intravenous access site secured and place NA Valuables secured Linens and cotton and cotton/polyester blend (less than 51% polyester) Personal oil-based products / skin lotions / body lotions removed Wigs or hairpieces removed NA Smoking or tobacco materials removed NA Books / newspapers / magazines / loose paper removed Cologne, aftershave, perfume and deodorant removed Jewelry removed (may wrap wedding band) NA Make-up removed NA Hair care products removed NA Battery operated devices (external) removed Heating patches and chemical warmers removed NA Titanium eyewear removed NA Nail polish cured greater than 10  hours NA Casting material cured greater than 10 hours NA Hearing aids removed NA Loose dentures or partials removed NA Prosthetics have been removed NA Patient demonstrates correct use of air break device (if applicable) Patient concerns have been addressed Patient grounding bracelet on and cord attached to chamber Specifics for Inpatients (complete in  addition to above) Medication sheet sent with patient Intravenous medications needed or due during therapy sent with patient Drainage tubes (e.g. nasogastric tube or chest tube secured and vented) Endotracheal or Tracheotomy tube secured Cuff deflated of air and inflated with saline Airway suctioned Electronic Signature(s) Signed: 07/04/2019 9:17:33 AM By: Oneita Kras, [name] Entered By: Montey Hora on 07/04/2019 09:17:32

## 2019-07-04 NOTE — Progress Notes (Addendum)
DAYTON, ROPP ([id number]) Visit Report for 07/04/2019 Arrival Information Details Patient Name: [name], [name] Date of Service: 07/04/2019 10:15 AM Medical Record Number: [id number] Patient Account Number: [id number] Date of Birth/Sex: 02-Oct-1950 (68 y.o. M) Treating RN: Montey Hora Primary Care Vernis Eid: [name] Other Clinician: [name] Referring Deina Lipsey: [name] Treating Reeshemah Nazaryan/Extender: [name], HOYT Weeks in Treatment: 54 Visit Information History Since Last Visit Added or deleted any medications: No Patient Arrived: Wheel Chair Any new allergies or adverse reactions: No Arrival Time: 08:31 Had a fall or experienced change in No Accompanied By: spouse activities of daily living that may affect Transfer Assistance: Harrel Lemon Lift risk of falls: Patient Identification Verified: Yes Signs or symptoms of abuse/neglect since last visito No Secondary Verification Process Completed: Yes Hospitalized since last visit: No Patient Requires Transmission-Based No Implantable device outside of the clinic excluding No Precautions: cellular tissue based products placed in the center Patient Has Alerts: Yes since last visit: Patient Alerts: NOT Has Dressing in Place as Prescribed: Yes Diabetic Pain Present Now: No Electronic Signature(s) Signed: 07/04/2019 8:32:08 AM By: Montey Hora Entered By: Montey Hora on 07/04/2019 08:32:08 [name] ([id number]) -------------------------------------------------------------------------------- Encounter Discharge Information Details Patient Name: [name] Date of Service: 07/04/2019 10:15 AM Medical Record Number: [id number] Patient Account Number: [id number] Date of Birth/Sex: [birth year] (68 y.o. M) Treating RN: Montey Hora Primary Care Terryon Pineiro: [name] Other Clinician: [name] Referring Neva Ramaswamy: [name] Treating Jenalee Trevizo/Extender: [name], HOYT Weeks in Treatment: 17 Encounter Discharge Information Items Post Procedure Vitals Discharge Condition: Stable Temperature (F): 98.9 Ambulatory Status: Wheelchair Pulse (bpm): 84 Discharge Destination: Home Respiratory Rate (breaths/min): 16 Transportation: Private Auto Blood Pressure (mmHg): 142/80 Accompanied By: daughter Schedule Follow-up Appointment: Yes Clinical Summary of Care: Electronic Signature(s) Signed: 07/04/2019 9:01:44 AM By: Montey Hora Entered By: Montey Hora on 07/04/2019 09:01:44 [name] ([id number]) -------------------------------------------------------------------------------- Lower Extremity Assessment Details Patient Name: [name] Date of Service: 07/04/2019 10:15 AM Medical Record Number: [id number] Patient Account Number: [id number] Date of Birth/Sex: 06/01/1951 (68 y.o. M) Treating RN: Montey Hora Primary Care Jaycey Gens: [name] Other Clinician: [name] Referring Karolee Meloni: [name] Treating Aurianna Earlywine/Extender: [name], HOYT Weeks in Treatment: 72 Electronic Signature(s) Signed: 07/04/2019 8:33:19 AM By: Montey Hora Entered By: Montey Hora on 07/04/2019 08:33:19 DIYARI, CAMINERO ([id number]) -------------------------------------------------------------------------------- Multi Wound Chart Details Patient Name: [name] Date of Service: 07/04/2019 10:15 AM Medical Record Number: [id number] Patient Account Number: [id number] Date of Birth/Sex: 06/08/1951 (68 y.o. M) Treating RN: Montey Hora Primary Care Estrellita Lasky: [name] Other Clinician: [name] Referring Kataya Guimont: [name] Treating Darrien Belter/Extender: [name], HOYT Weeks in Treatment: 93 Vital Signs Height(in): 73 Pulse(bpm): 84 Weight(lbs): 210 Blood Pressure(mmHg): 142/80 Body Mass Index(BMI): 28 Temperature(F): 98.9 Respiratory Rate 16 (breaths/min): Photos:  [N/A:N/A] Wound Location: Right Gluteal fold N/A N/A Wounding Event: Pressure Injury N/A N/A Primary Etiology: Pressure Ulcer N/A N/A Comorbid History: Cataracts, Hypertension, N/A N/A Paraplegia Date Acquired: 11/02/2017 N/A N/A Weeks of Treatment: 79 N/A N/A Wound Status: Open N/A N/A Measurements L x W x D 2.9x2.1x2.3 N/A N/A (cm) Area (cm) : 4.783 N/A N/A Volume (cm) : 11.001 N/A N/A % Reduction in Area: 52.40% N/A N/A % Reduction in Volume: -994.60% N/A N/A Classification: Category/Stage IV N/A N/A Exudate Amount: Large N/A N/A Exudate Type: Serous N/A N/A Exudate Color: amber N/A N/A Wound Margin: Epibole N/A N/A Granulation Amount: Large (67-100%) N/A N/A Granulation Quality: Pink, Hyper-granulation N/A N/A Necrotic Amount: Small (1-33%) N/A  N/A Exposed Structures: Fat Layer (Subcutaneous N/A N/A Tissue) Exposed: Yes Muscle: Yes Fascia: No Tendon: No Joint: No Bone: No KEELEN, [place] J. ([id number]) Epithelialization: Small (1-33%) N/A N/A Treatment Notes Electronic Signature(s) Signed: 07/04/2019 8:37:29 AM By: Montey Hora Entered By: Montey Hora on 07/04/2019 08:37:29 [name] ([id number]) -------------------------------------------------------------------------------- [place] Details Patient Name: [name] Date of Service: 07/04/2019 10:15 AM Medical Record Number: [id number] Patient Account Number: [id number] Date of Birth/Sex: May 21, 1951 (68 y.o. M) Treating RN: Montey Hora Primary Care Apollos Tenbrink: [name] Other Clinician: [name] Referring Mearl Olver: [name] Treating Smitty Ackerley/Extender: [name], HOYT Weeks in Treatment: 70 Active Inactive Orientation to the Wound Care Program Nursing Diagnoses: Knowledge deficit related to the wound healing center program Goals: Patient/caregiver will verbalize understanding of the [place] Program Date Initiated:  11/30/2017 Target Resolution Date: 12/21/2017 Goal Status: Active Interventions: Provide education on orientation to the wound center Notes: Pressure Nursing Diagnoses: Knowledge deficit related to management of pressures ulcers Goals: Patient/caregiver will verbalize understanding of pressure ulcer management Date Initiated: 05/17/2018 Target Resolution Date: 05/28/2018 Goal Status: Active Interventions: Assess: immobility, friction, shearing, incontinence upon admission and as needed Notes: Wound/Skin Impairment Nursing Diagnoses: Impaired tissue integrity Goals: Patient/caregiver will verbalize understanding of skin care regimen Date Initiated: 11/30/2017 Target Resolution Date: 12/21/2017 Goal Status: Active Ulcer/skin breakdown will have a volume reduction of 30% by week 4 Date Initiated: 11/30/2017 Target Resolution Date: 12/21/2017 Goal Status: Active DEWYANE, ZUG ([id number]) Interventions: Assess patient/caregiver ability to obtain necessary supplies Assess patient/caregiver ability to perform ulcer/skin care regimen upon admission and as needed Assess ulceration(s) every visit Treatment Activities: Skin care regimen initiated : 11/30/2017 Notes: Electronic Signature(s) Signed: 07/04/2019 8:37:21 AM By: Montey Hora Entered By: Montey Hora on 07/04/2019 08:37:20 JESPER, GASSNER ([id number]) -------------------------------------------------------------------------------- Pain Assessment Details Patient Name: [name] Date of Service: 07/04/2019 10:15 AM Medical Record Number: [id number] Patient Account Number: [id number] Date of Birth/Sex: 07/03/1951 (68 y.o. M) Treating RN: Montey Hora Primary Care Zoei Amison: [name] Other Clinician: [name] Referring Chandrea Zellman: [name] Treating Ac Colan/Extender: [name], HOYT Weeks in Treatment: 9 Active Problems Location of Pain Severity and Description of Pain Patient  Has Paino Yes Site Locations Pain Location: Pain in Ulcers With Dressing Change: No Pain Management and Medication Current Pain Management: Notes hurtsw when sitting Electronic Signature(s) Signed: 07/04/2019 8:32:33 AM By: Montey Hora Entered By: Montey Hora on 07/04/2019 08:32:33 [name] ([id number]) -------------------------------------------------------------------------------- Patient/Caregiver Education Details Patient Name: [name] Date of Service: 07/04/2019 10:15 AM Medical Record Number: [id number] Patient Account Number: [id number] Date of Birth/Gender: [birth year] (68 y.o. M) Treating RN: Montey Hora Primary Care Physician: [name] Other Clinician: [name] Referring Physician: [name] Treating Physician/Extender: Sharalyn Ink in Treatment: 65 Education Assessment Education Provided To: Patient and Caregiver Education Topics Provided Wound/Skin Impairment: Handouts: Other: wound care as ordered Methods: Explain/Verbal Responses: State content correctly Electronic Signature(s) Signed: 07/04/2019 3:19:47 PM By: Montey Hora Entered By: Montey Hora on 07/04/2019 09:00:19 [name] ([id number]) -------------------------------------------------------------------------------- Wound Assessment Details Patient Name: [name] Date of Service: 07/04/2019 10:15 AM Medical Record Number: [id number] Patient Account Number: [id number] Date of Birth/Sex: Mar 31, 1951 (68 y.o. M) Treating RN: Montey Hora Primary Care Neil Brickell: [name] Other Clinician: [name] Referring Joslynn Jamroz: [name] Treating Alecxis Baltzell/Extender: [name], HOYT Weeks in Treatment: 49 Wound Status Wound Number: 1 Primary Etiology: Pressure Ulcer Wound Location: Right Gluteal fold Wound Status: Open Wounding Event: Pressure Injury Comorbid  History: Cataracts, Hypertension,  Paraplegia Date Acquired: 11/02/2017 Weeks Of Treatment: 83 Clustered Wound: No Photos Wound Measurements Length: (cm) 2.9 % Reductio Width: (cm) 2.1 % Reductio Depth: (cm) 2.3 Epithelial Area: (cm) 4.783 Tunneling Volume: (cm) 11.001 Positi Maximum n in Area: 52.4% n in Volume: -994.6% ization: Small (1-33%) : Yes on (o'clock): 12 Distance: (cm) 3.5 Undermining: No Wound Description Classification: Category/Stage IV Foul Odor Wound Margin: Epibole Slough/Fib Exudate Amount: Large Exudate Type: Serous Exudate Color: amber After Cleansing: No rino Yes Wound Bed Granulation Amount: Large (67-100%) Exposed Structure Granulation Quality: Pink, Hyper-granulation Fascia Exposed: No Necrotic Amount: Small (1-33%) Fat Layer (Subcutaneous Tissue) Exposed: Yes Necrotic Quality: Adherent Slough Tendon Exposed: No Muscle Exposed: Yes Necrosis of Muscle: No Joint Exposed: No CHAMBERS, TUCKER. ([id number]) Bone Exposed: No Treatment Notes Wound #1 (Right Gluteal fold) Notes zinc to peri wound, silvercel, abd and tape Electronic Signature(s) Signed: 07/04/2019 8:38:40 AM By: Montey Hora Previous Signature: 07/04/2019 8:31:40 AM Version By: Montey Hora Entered By: Montey Hora on 07/04/2019 08:38:39 [name] ([id number]) -------------------------------------------------------------------------------- Vitals Details Patient Name: [name] Date of Service: 07/04/2019 10:15 AM Medical Record Number: [id number] Patient Account Number: [id number] Date of Birth/Sex: 26-Oct-1950 (68 y.o. M) Treating RN: Montey Hora Primary Care Graison Leinberger: [name] Other Clinician: [name] Referring Rakayla Ricklefs: [name] Treating Indyah Saulnier/Extender: [name], HOYT Weeks in Treatment: 72 Vital Signs Time Taken: 08:15 Temperature (F): 98.9 Height (in): 73 Pulse (bpm): 84 Weight (lbs): 210 Respiratory Rate (breaths/min): 16 Body Mass  Index (BMI): 27.7 Blood Pressure (mmHg): 142/80 Reference Range: 80 - 120 mg / dl Electronic Signature(s) Signed: 07/04/2019 8:33:10 AM By: Montey Hora Entered By: Montey Hora on 07/04/2019 08:33:09

## 2019-07-04 NOTE — Progress Notes (Addendum)
[name], [name] ([id number]) Visit Report for 07/04/2019 Chief Complaint Document Details Patient Name: [name], [name] Date of Service: 07/04/2019 10:15 AM Medical Record Number: [id number] Patient Account Number: [id number] Date of Birth/Sex: [birth year] (68 y.o. M) Treating RN: Harold Barban Primary Care Provider: [name] Other Clinician: [name] Referring Provider: [name] Treating Provider/Extender: [name], Sander Speckman Weeks in Treatment: 64 Information Obtained from: Patient Chief Complaint Right gluteal fold ulcer Electronic Signature(s) Signed: 07/04/2019 8:32:36 AM By: Worthy Keeler [name] Entered By: Worthy Keeler on 07/04/2019 08:32:36 [name], [name] ([id number]) -------------------------------------------------------------------------------- Debridement Details Patient Name: [name] Date of Service: 07/04/2019 10:15 AM Medical Record Number: [id number] Patient Account Number: [id number] Date of Birth/Sex: 1951-06-17 (68 y.o. M) Treating RN: Montey Hora Primary Care Provider: [name] Other Clinician: [name] Referring Provider: [name] Treating Provider/Extender: [name], Minnie Legros Weeks in Treatment: 50 Debridement Performed for Wound #1 Right Gluteal fold Assessment: Performed By: Physician [name], Beau Vanduzer E., [name] Debridement Type: Debridement Level of Consciousness (Pre- Awake and Alert procedure): Pre-procedure Verification/Time Yes - 08:39 Out Taken: Start Time: 08:39 Pain Control: Lidocaine 4% Topical Solution Total Area Debrided (L x W): 2.9 (cm) x 2.1 (cm) = 6.09 (cm) Tissue and other material Viable, Non-Viable, Slough, Subcutaneous, Slough, Hyper-granulation debrided: Level: Skin/Subcutaneous Tissue Debridement Description: Excisional Instrument: Curette Bleeding: Moderate Hemostasis Achieved: Silver Nitrate End Time: 08:41 Procedural Pain: 0 Post Procedural Pain:  0 Response to Treatment: Procedure was tolerated well Level of Consciousness Awake and Alert (Post-procedure): Post Debridement Measurements of Total Wound Length: (cm) 2.9 Stage: Category/Stage IV Width: (cm) 2.1 Depth: (cm) 2.5 Volume: (cm) 11.958 Character of Wound/Ulcer Post Improved Debridement: Post Procedure Diagnosis Same as Pre-procedure Electronic Signature(s) Signed: 07/04/2019 8:41:10 AM By: Montey Hora Signed: 07/04/2019 5:30:09 PM By: Worthy Keeler [name] Entered By: Montey Hora on 07/04/2019 08:41:09 [name], [name] ([id number]) -------------------------------------------------------------------------------- HPI Details Patient Name: [name] Date of Service: 07/04/2019 10:15 AM Medical Record Number: [id number] Patient Account Number: [id number] Date of Birth/Sex: 12/31/1950 (68 y.o. 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 He states that there is no history of diabetes that he is aware of. He does have issues with his prostate and is currently receiving treatment for this by way of oral medication. With that being said I do not have a lot of details in that regard. Nonetheless the patient presents today as a result of having been referred to [place] by another provider initially home health was set to come out and take care of his wound although due to the fact that he apparently drives he's not able to receive home health. His wife is therefore trying to help take care of this  wound within although they have been struggling with what exactly to do at this point. She states that she can do some things but she is definitely not a nurse and does have some issues with looking at blood. The good news is the wound does not appear to be too deep and is fairly superficial at this point. There is no slough noted there  is some nonviable skin noted around the surface of the wound and the perimeter at this point. The central portion of the wound appears to be very good with a dermal layer noted this does not appear to be again deep enough to extend it to subcutaneous tissue at this point. 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 In the pump he has Dilaudid and baclofen. 12/07/17 on evaluation today patient presents for follow-up concerning his ongoing lower back thigh ulcer on the right. He states that he did not get the supplies ordered and therefore has not really been able to perform the dressing changes as directed exactly. His wife was able to get some Boarder Foam Dressing's from the drugstore and subsequently has been using hydrogel which did help to a degree in the wound does appear to be able smaller. There is actually more drainage this week noted than previous. 12/21/17 on evaluation today patient appears to be doing rather well in regard to his right gluteal ulcer. He has been tolerating the dressing changes without complication. There does not appear to be any evidence of infection at this point in time. Overall the wound does seem to be making some progress as far as the edges are concerned there's not as much in the way of overlapping of the external wound edges and he has a good epithelium to wound bed border for the most part. This however is not true right at the 12 o'clock location over the span of a little over a centimeters which actually will require  debridement today to clean this away and hopefully allow it to continue to heal more appropriately. 12/28/17 on evaluation today patient appears to be doing rather well in regard to his ulcer in the left gluteal region. He's been tolerating the dressing changes without complication. Apparently he has had some difficulty getting his dressing material. Apparently there's been some confusion with ordering we're gonna check into this. Nonetheless overall he's been showing signs of improvement which is good news. Debridement is not required today. 01/04/18 on evaluation today patient presents for follow-up concerning his right gluteal ulcer. He has been tolerating the dressing changes fairly well. On inspection today it appears he may actually have some maceration them concerned about the fact that he may be developing too much moisture in and around the wound bed which can cause delay in healing. With that being said he unfortunately really has not showed significant signs of improvement since last week's evaluation in fact this may even be just the little bit/slightly larger. Nonetheless he's been having a lot of discomfort I'm not sure this is even related to the wound as he has no pain when I'm to breeding or otherwise cleaning the wound during evaluation today. Nonetheless this is something that we did recommend he talked to his pain specialist concerning. 01/11/18 on evaluation today patient appears to be doing better in regard to his ulceration. He has been tolerating the dressing changes without complication. With that being said overall there's no evidence of infection which is good news. The only thing is he did receive the hatch affair blue classic versus the ready nonetheless I feel like this is perfectly fine and appears to have done well for him over the past week. 01/25/18 on evaluation today patient's wound actually appears to be a little bit larger than during the last evaluation. The  good  [name], [name] ([id number]) news is the majority of the wound edges actually appear to be fairly firmly attached to the wound bed unfortunately again we're not really making progress in regard to the size. Roughly the wound is about the same size as when I first saw him although again the wound margin/edges appear to be much better. 02/01/18 on evaluation today patient actually appears to be doing very well in regard to his wound. Applying the Prisma dry does seem to be better although he does still have issues with slow progression of the wound. There was a slight improvement compared to last week's measurements today. Nonetheless I have been considering other options as far as the possibility of Theraskin or even a snap vac. In general I'm not sure that the Theraskin due to location of the wound would be a very good idea. Nonetheless I do think that a snap vac could be a possibility for the patient and in fact I think this could even be an excellent way to manage the wound possibly seeing some improvement in a very rapid fashion here. Nonetheless this is something that we would need to get approved and I did have a lengthy conversation with the patient about this today. 02/08/18 on evaluation today patient appears to be doing a little better in regard to his ulcer. He has been tolerating the dressing changes without complication. Fortunately despite the fact that the wound is a little bit smaller it's not significantly so unfortunately. We have discussed the possibility of a snap vac we did check with insurance this is actually covered at this point. Fortunately there does not appear to be any sign of infection. Overall I'm fairly pleased with how things seem to be appearing at this point. 02/15/18 on evaluation today patient appears to be doing rather well in regard to his right gluteal ulcer. Unfortunately the snap vac did not stay in place with his sheer and friction this came loose and  did not seem to maintain seal very well. He worked for about two days and it did seem to do very well during that time according to his wife but in general this does not seem to be something that's gonna be beneficial for him long-term. I do believe we need to go back to standard dressings to see if we can find something that will be of benefit. 03/02/18- He is here in follow up evaluation; there is minimal change in the wound. He will continue with the same treatment plan, would consider changing to iodosrob/iodoflex if ulcer continues to to plateau. He will follow up next week 03/08/18 on evaluation today patient's wound actually appears to be about the same size as when I previously saw him several weeks back. Unfortunately he does have some slightly dark discoloration in the central portion of the wound which has me concerned about pressure injury. I do believe he may be sitting for too long a period of time in fact he tells me that "I probably sit for much too long". He does have some Slough noted on the surface of the wound and again as far as the size of the wound is concerned I'm really not seeing anything that seems to have improved significantly. 03/15/18 on evaluation today patient appears to be doing fairly well in regard to his ulcer. The wound measured pretty much about the same today compared to last week's evaluation when looking at his graph. With that being said the area of bruising/deep tissue injury that  was noted last week I do not see at this point. He did get a new cushion fortunately this does seem to be have been of benefit in my pinion. It does appear that he's been off of this more which is good news as well I think that is definitely showing in the overall wound measurements. With that being said I do believe that he needs to continue to offload I don't think that the fact this is doing better should be or is going to allow him to not have to offload and explain this to him  as well. Overall he seems to be in agreement the plan I think he understands. The overall appearance of the wound bed is improved compared to last week I think the Iodoflex has been beneficial in that regard. 03/29/18 on evaluation today patient actually appears to be doing rather well in regard to his wound from the overall appearance standpoint he does have some granulation although there's some Slough on the surface of the wound noted as well. With that being said he unfortunately has not improved in regard to the overall measurement of the wound in volume or in size. I did have a discussion with him very specifically about offloading today. He actually does work although he mainly is just sitting throughout the day. He tells me he offloads by "lifting himself up for 30 seconds off of his chair occasionally" purchase from [employer] which does seem to have helped. And he has a new cushion that he with that being said he's also able to stand some for a very short period of time but not significant enough I think to provide appropriate offloading. I think the biggest issue at this point with the wound and the fact is not healing as quickly as we would like is due to the fact that he is really not able to appropriately offload while at work. He states the beginning after his injury he actually had a bed at his job that he could lay on in order to offload and that does seem to have been of help back at that time. Nonetheless he had not done this in quite some time unfortunately. I think that could be helpful for him this is something I would like for him to look into. 04/05/18 on evaluation today patient actually presents for follow-up concerning his right gluteal ulcer. Again he really is not significantly improved even compared to last week. He has been tolerating the dressing changes without complication. With that being said fortunately there appears to be no evidence of infection at this  time. He has been more proactive in trying to offload. [name], [name] ([id number]) 04/12/18 on evaluation today patient actually appears to be doing a little better in regard to his wound and the right gluteal fold region. He's been tolerating the dressing changes since removing the oasis without complication. However he was having a lot of burning initially with the oasis in place. He's unsure of exactly why this was given so much discomfort but he assumes that it was the oasis itself causing the problem. Nonetheless this had to be removed after about three days in place although even those three days seem to have made a fairly good improvement in regard to the overall appearance of the wound bed. In fact is the first time that he's made any improvement from the standpoint of measurements in about six weeks. He continues to have no discomfort over the area of the wound  itself which leads me to wonder why he was having the burning with the oasis when he does not even feel the actual debridement's themselves. I am somewhat perplexed by this. 04/19/18 on evaluation today patient's wound actually appears to be showing signs of epithelialization around the edge of the wound and in general actually appears to be doing better which is good news. He did have the same burning after about three days with applying the Endoform last week in the same fashion that I would generally apply a skin substitute. This seems to indicate that it's not the oasis to cause the problem but potentially the moisture buildup that just causes things to burn or there may be some other reaction with the skin prep or Steri-Strips. Nonetheless I'm not sure that is gonna be able to tolerate any skin substitute for a long period of time. The good news is the wound actually appears to be doing better today compared to last week and does seem to finally be making some progress. 04/26/18 on evaluation today patient actually appears to  be doing rather well in regard to his ulcer in the right gluteal fold. He has been tolerating the dressing changes without complication which is good news. The Endoform does seem to be helping although he was a little bit more macerated this week. This seems to be an ongoing issue with fluid control at this point. Nonetheless I think we may be able to add something like Drawtex to help control the drainage. 05/03/18 on evaluation today patient appears to actually be doing better in regard to the overall appearance of his wound. He has been tolerating the dressing changes without complication. Fortunately there appears to be no evidence of infection at this time. I really feel like his wound has shown signs as of today of turning around last week I thought so as well and definitely he could be seen in this week's overall appearance and measurements. In general I'm very pleased with the fact that he finally seems to be making a steady but sure progress. The patient likewise is very pleased. 05/17/18 on evaluation today patient appears to be doing more poorly unfortunately in regard to his ulcer. He has been tolerating the dressing changes without complication. With that being said he tells me that in the past couple of days he and his wife have noticed that we did not seem to be doing quite as well is getting dark near the center. Subsequently upon evaluation today the wound actually does appear to be doing worse compared to previous. He has been tolerating the dressing changes otherwise and he states that he is not been sitting up anymore than he was in the past from what he tells me. Still he has continued to work he states "I'm tired of dealing with this and if I have to just go home and lay in the bed all the time that's what I'll do". Nonetheless I am concerned about the fact that this wound does appear to be deeper than what it was previous. 05/24/18 upon evaluation today patient actually  presents after having been in the hospital due to what was presumed to be sepsis secondary to the wound infection. He had an elevated white blood cell count between 14 and 15. With that being said he does seem to be doing somewhat better now. His wound still is giving him some trouble nonetheless and he is obviously concerned about the fact likely talked about that this does seem to  go more deeply than previously noted. I did review his wound culture which showed evidence of Staphylococcus aureus him and group B strep. Nonetheless he is on antibiotics, Levaquin, for this. Subsequently I did review his intake summary from the hospital as well. I also did look at the CT of the lumbar spine with contrast that was performed which showed no bone destruction to suggest lumbar disguises/osteomyelitis or sacral osteomyelitis. There was no paraspinal abscess. Nonetheless it appears this may have been more of just a soft tissue infection at this point which is good news. He still is nonetheless concerned about the wound which again I think is completely reasonable considering everything he's been through recently. 05/31/18 on evaluation today on evaluation today patient actually appears to be showing signs of his wound be a little bit deeper than what I would like to see. Fortunately he does not show any signs of significant infection although his temperature was 99 today he states he's been checking this at home and has not been elevated. Nonetheless with the undermining that I'm seeing at this point I am becoming more concerned about the wound I do think that offloading is a key factor here that is preventing the speedy recovery at this point. There does not appear to be any evidence of again over infection noted. He's been using Santyl currently. 06/07/18 the patient presents today for follow-up evaluation regarding the left ulcer in the gluteal region. He has been tolerating the Wound VAC fairly well. He  is obviously very frustrated with this he states that to mean is really getting in his way. There does not appear to be any evidence of infection at this time he does have a little bit of odor I do not necessarily associate this with infection just something that we sometimes notice with Wound VAC therapy. With that being said I can definitely catch a tone of discontentment overall in the patient's demeanor today. This when he was previously in the hospital [name], [name]. ([id number]) an CT scan was done of the lumbar region which did not reveal any signs of osteomyelitis. With that being said the pelvis in particular was not evaluated distinctly which means he could still have some osteonecrosis I. Nonetheless the Wound VAC was started on Thursday I do want to get this little bit more time before jumping to a CT scan of the pelvis although that is something that I might would recommend if were not see an improvement by that time. 06/14/18 on evaluation today patient actually appears to be doing about the same in regard to his right gluteal ulcer. Again he did have a CT scan of the lumbar spine unfortunately this did not include the pelvis. Nonetheless with the depth of the wound that I'm seeing today even despite the fact that I'm not seeing any evidence of overt cellulitis I believe there's a good chance that we may be dealing with osteomyelitis somewhere in the right Ischial region. No fevers, chills, nausea, or vomiting noted at this time. 06/21/18 on evaluation today patient actually appears to be doing about the same with regard to his wound. The tunnel at 6 o'clock really does not appear to be any deeper although it is a little bit wider. I think at this point you may want to start packing this with white phone. Unfortunately I have not got approval for the CT scan of the pelvis as of yet due to the fact that Medicare apparently has been denied it due to  the diagnosis codes not being  appropriate according to Medicare for the test requested. With that being said the patient cannot have an MRI and therefore this is the only option that we have as far as testing is concerned. The patient has had infection and was on antibiotics and been added code for cellulitis of the bottom to see if this will be appropriate for getting the test approved. Nonetheless I'm concerned about the infection have been spread deeper into the Ischial region. 06/28/18 on evaluation today patient actually appears to be doing rather well all things considered in regard to the right gluteal ulcer. He has been tolerating the dressing changes without complication. With that being said the Wound VAC he states does have to be replaced almost every day or at least reinforced unfortunately. Patient actually has his CT scan later this morning we should have the results by tomorrow. 07/05/18 on evaluation today patient presents for follow-up concerning his right Ischial ulcer. He did see the surgeon Dr. Lysle Pearl last week. They were actually very happy with him and felt like he spent a tremendous amount of time with them as far as discussing his situation was concerned. In the end Dr. Lysle Pearl did contact me as well and determine that he would not recommend any surgical intervention at this point as he felt like it would not be in the patient's best interest based on what he was seeing. He recommended a referral to infectious disease. Subsequently this is something that Dr. Ines Bloomer office is working on setting up for the patient. As far as evaluation today is concerned the patient's wound actually appears to be worse at this point. I am concerned about how things are progressing and specifically about infection. I do not feel like it's the deeper but the area of depth is definitely widening which does have me concerned. No fevers, chills, nausea, or vomiting noted at this time. I think that we do need initiate antibiotic  therapy the patient has an allow allergy to amoxicillin/penicillin he states that he gets a rash since childhood. Nonetheless she's never had the issues with [employer] or cephalosporins in general but he is aware of. 07/27/18 on evaluation today patient presents following admission to the hospital on 07/09/18. He was subsequently discharged on 07/20/18. On 07/15/18 the patient underwent irrigation and debridement was soft tissue biopsy and bone biopsy as well as placement of a Wound VAC in the OR by Dr. Celine Ahr. During the hospital course the patient was placed on a Wound VAC and recommended follow up with surgery in three weeks actually with Dr. Delaine Lame who is infectious disease. The patient was on vancomycin during the hospital course. 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 He also had a bone culture which revealed evidence of methicillin-resistant staph aureus. He is updated CT scan 07/09/18 reveals that he had progression of the which was performed on wound to breakdown down to the trochanter where he actually had irregularities there as well suggestive of osteomyelitis. This was a change just since 9 December when we last performed a CT scan. Obviously this one had gone downhill quite significantly and rapidly. At this point upon evaluation I feel like in general the patient's wound seems to be doing fairly well all things considered upon my evaluation today. Obviously this is larger and deeper than what I previously evaluated but at the same time he seems to be making some progress as far as the appearance of the granulation  tissue is concerned. I'm happy in that regard. No fevers, chills, nausea, or vomiting noted at this time. He is on IV vancomycin and Rocephin at the facility. He is currently in [facility]. 08/03/18 upon evaluation today patient's wound appears to be doing better in regard to the overall appearance at this point in time.  Fortunately he's been tolerating the Wound VAC without complication and states that the facility has been taking excellent care of the wound site. Overall I see some Slough noted on the surface which I am going to attempt sharp debridement today of but nonetheless other than this I feel like he's making progress. 08/09/18 on evaluation today patient's wound appears to be doing much better compared to even last week's evaluation. Do believe that the Wound VAC is been of great benefit for him. He has been tolerating the dressing changes that is the Wound BRIAM, KNUCKLES. ([id number]) VAC without any complication and he has excellent granulation noted currently. There is no need for sharp debridement at this point. 08/16/18 on evaluation today patient actually appears to be doing very well in regard to the wound in the right gluteal fold region. This is showing signs of progress and again appears to be very healthy which is excellent news. Fortunately there is no sign of active infection by way of odor or drainage at this point. Overall I'm very pleased with how things stand. He seems to be tolerating the Wound VAC without complication. 08/23/18 on evaluation today patient actually appears to be doing better in regard to his wound. He has been tolerating the Wound VAC without complication and in fact it has been collecting a significant amount of drainage which I think is good news especially considering how the wound appears. Fortunately there is no signs of infection at this time definitely nothing appears to be worse which is good news. He has not been started on the Bactrim and Flagyl that was recommended by Dr. Delaine Lame yet. I did actually contact her office this morning in order to check and see were things are that regard their gonna be calling me back. 08/30/18 on evaluation today patient actually appears to show signs of excellent improvement today compared to last evaluation. The  undermining is getting much better the wound seems to be feeling quite nicely and I'm very pleased that the granulation in general. With that being said overall I feel like the patient has made excellent progress which is great news. No fevers, chills, nausea, or vomiting noted at this time. 09/06/18 on evaluation today patient actually appears to be doing rather well in regard to his right gluteal ulcer. This is showing signs of improvement in overall I'm very pleased with how things seem to be progressing. The patient likewise is please. Overall I see no evidence of infection he is about to complete his oral antibiotic regimen which is the end of the antibiotics for him in just about three days. 09/13/18 on evaluation today patient's right Ischial ulcer appears to be showing signs of continued improvement which is excellent news. He's been tolerating the dressing changes without complication. Fortunately there's no signs of infection and the wound that seems to be doing very well. 09/28/18 on evaluation today patient appears to be doing rather well in regard to his right Ischial ulcer. He's been tolerating the Wound VAC without complication he knows there's much less drainage than there used to be this obviously is not a bad thing in my pinion. There's no evidence of  infection despite the fact is but nothing about it now for several weeks. 10/04/18 on evaluation today patient appears to be doing better in regard to his right Ischial wound. He has been tolerating the Wound VAC without complication and I do believe that the silver nitrate last week was beneficial for him. Fortunately overall there's no evidence of active infection at this time which is great news. No fevers, chills, nausea, or vomiting noted at this time. 10/11/18 on evaluation today patient actually appears to be doing rather well in regard to his Ischial ulcer. He's been tolerating the Wound VAC still without complication I feel like  this is doing a good job. No fevers, chills, nausea, or vomiting noted at this time. 11/01/18 on evaluation today patient presents after having not been seen in our clinic for several weeks secondary to the fact that he was on evaluation today patient presents after having not been seen in our clinic for several weeks secondary to the fact that he was in a skilled nursing facility which was on lockdown currently due to the covert 19 national emergency. Subsequently he was discharged from the facility on this past Friday and subsequently made an appointment to come in to see yesterday. Fortunately there's no signs of active infection at this time which is good news and overall he does seem to have made progress since I last saw. Overall I feel like things are progressing quite nicely. The patient is having no pain. 11/08/18 on evaluation today patient appears to be doing okay in regard to his right gluteal ulcer. He has been utilizing a Wound VAC home health this changing this at this point since he's home from the skilled nursing facility. Fortunately there's no signs of obvious active infection at this time. Unfortunately though there's no obvious active infection he is having some maceration and his wife states that when the sheets of the Wound VAC office on Sunday when it broke seal that he ended up having significant issues with some smell as well there concerned about the possibility of infection. Fortunately there's No fevers, chills, nausea, or vomiting noted at this time. 11/15/18 on evaluation today patient actually appears to be doing well in regard to his right gluteal ulcer. He has been tolerating the dressing changes without complication. Specifically the Wound VAC has been utilized up to this point. Fortunately there's no signs of infection and overall I feel like he has made progress even since last week when I last saw him. I'm actually fairly happy with the overall appearance although  he does seem to have somewhat of a hyper granular Althouse, Phuong J. ([id number]) overgrowth in the central portion of the wound which I think may require some sharp debridement to try flatness out possibly utilizing chemical cauterization following. 11/23/18 on evaluation today patient actually appears to be doing very well in regard to his sacral ulcer. He seems to be showing signs of improvement with good granulation. With that being said he still has the small area of hyper granulation right in the central portion of the wound which I'm gonna likely utilize silver nitrate on today. Subsequently he also keeps having a leak at the 6 o'clock location which is unfortunate we may be able to help out with some suggestions to try to prevent this going forward. Fortunately there's no signs of active infection at this time. 11/29/18 on evaluation today patient actually appears to be doing quite well in regard to his pressure ulcer in the right gluteal  fold region. He's been tolerating the dressing changes without complication. Fortunately there's no signs of active infection at this time. I've been rather pleased with how things have progressed there still some evidence of pressure getting to the area with some redness right around the immediate wound opening. Nonetheless other than this I'm not seeing any significant complications or issues the wound is somewhat hyper granular. Upon discussing with the patient and his wife today I'm not sure that the wound is being packed to the base with the foam at this point. And if it's not been packed fully that may be part of the reason why is not seen as much improvement as far as the granulation from the base out. Again we do not want pack too tightly but we need some of the firm to get to the base of the wound. I discussed this with patient and his wife today. 12/06/18 on evaluation today patient appears to be doing well in regard to his right gluteal pressure  ulcer. He's been tolerating the dressing changes without complication. Fortunately there's no signs of active infection. He still has some hyper granular tissue and I do think it would be appropriate to continue with the chemical cauterization as of today. 12/16/18 on evaluation today patient actually appears to be doing okay in regard to his right gluteal ulcer. He is been tolerating the dressing changes without complication including the Wound VAC. Overall I feel like nothing seems to be worsening I do feel like that the hyper granulation buds in the central portion of the wound have improved to some degree with the silver nitrate. We will have to see how things continue to progress. 12/20/18 on evaluation today patient actually appears to be doing much worse in my pinion even compared to last week's evaluation. Unfortunately as opposed to showing any signs of improvement the areas of hyper granular tissue in the central portion of the wound seem to be getting worse. Subsequently the wound bed itself also seems to be getting deeper even compared to last week which is both unusual as well as concerning since prior he had been shown signs of improvement. Nonetheless I think that the issue could be that he's actually having some difficulty in issues with a deeper infection. There's no external signs of infection but nonetheless I am more worried about the internal, osteomyelitis, that could be restarting. He has not been on antibiotics for some time at this point. I think that it may be a good idea to go ahead and started back on an antibiotic therapy while we wait to see what the testing shows. 12/27/18 on evaluation today patient presents for follow-up concerning his left gluteal fold wound. Fortunately he appears to be doing well today. I did review the CT scan which was negative for any signs of osteomyelitis or acute abnormality this is excellent news. Overall I feel like the surface of the wound bed  appears to be doing significantly better today compared to previously noted findings. There does not appear any signs of infection nor does he have any pain at this time. 01/03/19 on evaluation today patient actually appears to be doing quite well in regard to his ulcer. Post debridement last week he really did not have too much bleeding which is good news. Fortunately today this seems to be doing some better but we still has some of the hyper granular tissue noted in the base of the wound which is gonna require sharp debridement today as well.  Overall I'm pleased with how things seem to be progressing since we switched away from the Wound VAC I think he is making some progress. 01/10/19 on evaluation today patient appears to be doing better in regard to his right gluteal fold ulcer. He has been tolerating the dressing changes without complication. The debridement to seem to be helping with current away some of the poor hyper granular tissue bugs throughout the region of his gluteal fold wound. He's been tolerating the dressing changes otherwise without complication which is great news. No fevers, chills, nausea, or vomiting noted at this time. 01/17/19 on evaluation today patient actually appears to be doing excellent in regard to his wound. He's been tolerating the dressing changes without complication. Fortunately there is no signs of active infection at this time which is great news. No fevers, chills, nausea, or vomiting noted at this time. 01/24/19 on evaluation today patient actually appears to be doing quite well with regard to his ulcer. He has been tolerating the dressing changes without complication. Fortunately there's no signs of active infection at this time. Overall been very pleased with the progress that he seems to be making currently. 01/31/19-Patient returns at 1 week with apparent similarity in dimensions to the wound, with no signs of infection, he has been OLUWATOMISIN, PLUEGER.  ([id number]) changing dressings twice a day 02/08/19 upon evaluation today patient actually appears to be doing well with regard to his right Ischial ulcer. The wound is not appear to be quite as deep and seems to be making progress which is good news. With that being said I'm still reluctant to go back to the Wound VAC at this point. He's been having to change the dressings twice a day which is a little bit much in my pinion from the wound care supplies standpoint. I think that possibly attempting to utilize extras orbit may be beneficial this may also help to prevent any additional breakdown secondary to fluid retention in the wound itself. The patient is in agreement with giving this a try. 02/15/19 on evaluation today patient actually appears to be doing decently well with regard to his ulcer in the right to gluteal fold location. He's been tolerating the dressing changes without complication. Fortunately there is no signs of active infection at this time. He is able to keep the current dressing in place more effectively for a day at a time whereas before he was having a changes to to three times a day. The actions or has been helpful in this regard. Fortunately there's no signs of anything getting worse and I do feel like he showing signs of good improvement with regard to the wound bed status. 02/22/2019 patient appears to be doing very well today with regard to his ulcer in the gluteal fold. Fortunately there is no signs of active infection and he has been tolerating the dressing changes without any complication. Overall extremely pleased with how things seem to be progressing. He has much less of the hyper granular projections within the wound these have slowly been debrided away and he seems to be doing well. The wound bed is more uniform. 03/01/19 on evaluation today patient appears to be doing unfortunately about the same in regard to his gluteal ulcer. He's been tolerating the dressing  changes without complication. Fortunately there's no signs of active infection at this time. With that being said he continues to develop these hyper granular projections which I'm unsure of exactly what they are and why they are rising.  Nonetheless I explained to the patient that I do believe it would be a good idea for [place] to stand a biopsy sample for pathology to see if that can shed any light on what exactly may be going on here. Fortunately I do not see any obvious signs of infection. With that being said the patient has had a little bit more drainage this week apparently compared to last week. 03/08/2019 on evaluation today patient actually appears to look somewhat better with regard to the appearance of his wound bed at this time. This is good news. Overall I am very pleased with how things seem to have progressed just in the past week with a switch to the [facility] dressing. I think that has been beneficial for him. With that being said at this time the patient is concerned about his biopsy that I sent off last week unfortunately I do not have that report as of yet. Nonetheless we have called to obtain this and hopefully will hear back from the lab later this morning. 03/15/19 on evaluation today patient's wound actually appears to be doing okay today with regard to the overall appearance of the wound bed. He has been tolerating the dressing changes without complication he still has hyper granular tissue noted but fortunately that seems to be minimal at this point compared to some of what we've seen in the past. Nonetheless I do think that he is still having some issues currently with some of this type of granulation the biopsy and since all showed nothing more than just evidence of granulation tissue. Therefore there really is nothing different to initiate or do at this point. 03/24/19 on evaluation today patient appears to be doing a little better with regard to his ulcer. He's been  tolerating the dressing changes without complication. Fortunately there is no signs of active infection at this time. No fevers, chills, nausea, or vomiting noted at this time. I'm overall pleased with how things seem to be progressing. 03/29/2019 on evaluation today patient appears to be doing about the same in regard to his ulcer in the right gluteal fold. Unfortunately he is not seeming to make a lot of progress and the wound is somewhat stalled. There is no signs of active infection externally but I am concerned about the possibility of infection continuing in the ischial location which previously he did have infection noted. Again is not able to have an MRI so probably our best option for testing for this would be a triple phase bone scan which will detect subtle changes in the bone more so than plain film x-rays. In the past they really have not been beneficial and in fact the CT scans even have been somewhat questionable at times. Nonetheless there is no signs of systemic infection which is at least good news but again his wound is not healing at all on the predicted schedule. 04/05/19 on evaluation today patient appears to be doing well all things considering with regard to his wound and the right gluteal fold. He actually has his triple bone scan scheduled for sometime in the next couple of days. With that being said I've also been looking to other possibilities of what could be causing hyper granular tissue were looking into the bone scan again for evaluating for the risk or possibility of infection deeper and I'm also gonna go ahead and see about obtaining a deep tissue culture today to send and see if there's any evidence of infection noted on culture. He's  in agreement with that plan. 04/12/2019 on evaluation today patient presents for reevaluation here in our clinic concerning ongoing issues with his right gluteal fold ulcer. 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 It was discussed with him at that point that I think it would be appropriate PAXTON, CARLONE. ([id number]) for him to proceed with hyperbaric oxygen therapy especially in light of the fact that he is previously been on IV antibiotics at the beginning of the year for close to 3 months followed by several weeks of oral antibiotics that was all prescribed by infectious disease. He had surgical debridement around Christmas December 2019 due to an abscess and osteomyelitis of the ischial bone. Unfortunately this has not really proceeded as well as we would have liked and again we did a CT scan even a couple months ago as he cannot have an MRI secondary to having issues with both a pain pump as well as a spinal cord stimulator which prevent him from going into an MRI machine. With that being said there were chronic changes noted at the ischial tuberosity which had progressed since December 2019 there was no evidence of fluid collection on that initial CT scan. With that being said that was on January 21, 2019. I am not sure that it was a sensitive enough test as compared to an MRI and then subsequently I ordered a bone scan for him which was actually completed on 03/29/2019 and this revealed that he does indeed have positive osteomyelitis involving the right ischial tuberosity. This is adjacent to the ulcer and I think is the reason that his ulcer is not healing. Subsequently I am in a place him back on oral antibiotics today unfortunately his wound culture showed just mixed gram-negative flora with no specific findings of a predominant organism. Nonetheless being with the location I think a good broad-spectrum antibiotic for gram-negative's is a good choice at this point he is previously taken Levaquin without significant issues and I think that is an appropriate antibiotic for him at this time. 04/19/2019 on  evaluation today patient actually appears to be doing fairly well with regard to his wound. He has been tolerating the dressing changes without complication. Fortunately there is no signs of active infection and he has been taking the oral antibiotics at this time. Subsequently we did make a referral to infectious disease although Dr. Steva Ready wants the patient to be seen by general surgery first in order apparently to see if there is anything from a surgical standpoint that should be done prior to initiation of IV antibiotic therapy. Again the patient is okay with actually seeing Dr. Celine Ahr whom he has seen before we will make that referral. 04/26/2019 on evaluation today patient actually appears to be doing well with regard to his wound. He has been tolerating the dressing changes without complication. Fortunately there is no signs of active infection at this time. I do believe that the hyperbaric oxygen therapy along with the antibiotics which I prescribed at this point have been doing well for him. With that being said he has not seen Dr. Celine Ahr the surgeon yet in fact they have not been contacted for scheduling an appointment as of yet. Subsequently the patient is not on antibiotics currently by IV Dr. Steva Ready did want him to see Dr. Celine Ahr first which we are working on trying to get scheduled. We again  give the information to the patient today for Dr. Celine Ahr and her number as far as contacting their office to see about getting something scheduled. Again were looking for whether or not they recommend any surgical intervention. 05/03/2019 on evaluation today patient appears to be doing about the same with regard to his wound there may be a little bit of filling in in regard to the base of the wound but still he has quite a bit of hyper granular tissue that I am seeing today. I believe that he may may need a more aggressive sharp debridement possibly even by the surgeon or under anesthesia  in order to clear away some of the hyper granular material in order to help allow for appropriate granulation to fill in. We have made a referral to Dr. Celine Ahr unfortunately it sounds as if they did not receive the referral we contacted them today they should be get in touch with the patient. Depending on how things show from that standpoint the patient may need to see Dr. Ardyth Gal who is the infectious disease specialist although he really does not want a PICC line again. No fevers, chills, nausea, vomiting, or diarrhea. He is tolerating hyperbaric oxygen therapy very well. 05/12/2019 on evaluation today patient presents for follow-up visit concerning his right gluteal fold ulcer. He did see Dr. Celine Ahr the surgeon who previously evaluated his wound and she actually felt like he was doing quite well with regard to the wound based on what she was seen. He does seem to be responding to some degree with the oral antibiotics along with hyperbaric oxygen therapy at this point she did not see any evidence or need for further surgical intervention at this point. She recommended deferring additional or ongoing antibiotic therapy to Dr. Steva Ready at her discretion. Fortunately there is no signs of active systemic infection. No fevers, chills, nausea, vomiting, or diarrhea. 10/29; patient I am seeing really for the first time today in terms of his wound. 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 He is being treated with hyperbaric oxygen for underlying osteomyelitis most recently documented by a three-phase bone scan. He has been on Levaquin for roughly a month and he is out of these and asked me for a refill. Most recent cultures were negative although I do not think these were bone cultures. He has a very irregular surface to this wound which is almost nodular in texture. It undermines superiorly with the same nodular hyper granulated surface. I see that he was referred to  general surgery for an operative debridement although the surgeon did not agree with this I think that would have been the proper course of action in this. He has been using Hydrofera Blue. He is supposed to start a wound VAC tomorrow which is actually a reapplication of wound VAC. I think mostly last time they had trouble keeping the seal in place I hope we have better look at this this time. 05/24/2019 on evaluation today I did review patient's note from Dr. Dellia Nims where he was seen last week when I was out of the office. Subsequently it does appear that Dr. Dellia Nims was in agreement that the patient really needed debridement to clear away some of the nodular tissue in the base of the wound. The good news is he has the wound VAC initiated and some of this tissue is already starting to breakdown under the treatment of the wound VAC again because it is not really viable. Mikel,  AGRIM WIETING ([id number]) Nonetheless I do believe that we can likely perform some debridement today to clear this away and hopefully the continuation of the wound VAC along with hyperbarics and antibiotic therapy will be beneficial in stopping this from reoccurring. If we get better control in the wound bed in a good spot I think we can definitely use the PriMatrix which we actually did get approval for. 05/31/2019 on evaluation today patient appears to be doing actually pretty well at this point with regard to his wound. He has been tolerating the dressing changes which is good news. Fortunately there is no signs of infection. He still has some of the hyper granular nodules noted we have not really cleared all these away and I think we can try to do a little bit more that today. I did do quite a bit last week I am hoping to get down to the base of the wound so though actually be able to heal more appropriately. I think the wound VAC is doing a good job right now with controlling the drainage and overall the patient is happy  with that. 06/07/2019 upon evaluation today patient appears to be doing quite well compared to last week with regard to the hyper granulation in the base of the wound. He has been tolerating the dressing changes without complication. Fortunately there is no signs of active infection at this time. With that being said we have not had any recent measures as far as blood work is concerned I think we may want to check a few things including a CBC, sed rate, and C-reactive protein. Patient is in agreement with that we can try to get that scheduled for him for this Friday. 06/13/2019 on evaluation today patient actually appears to be doing much better at this point based on what I am seeing with regard to his wound. He has much less in the way of hyper granular tissue which is good news and a lot more healing that is taken place since I last saw him as far as the amount of space within the wound itself. With that being said he is nearing the completion of the initial 40 treatments for hyperbaric oxygen therapy I think this is something I would recommend extending as he does seem to be benefiting at this time. Fortunately there is no evidence of active systemic infection at this point nor even local infection for that matter. 06/20/2019 upon evaluation today patient actually appears to be doing well with regard to his wound. In fact this appears to be doing much better today compared to what it was previous. I been extremely pleased with the progress that he is made. In fact the tunnel is much less deep today than what it was in the past and I am seeing excellent signs of improvement. Overall I am happy with how things are going. I did review his white blood cell count which revealed everything to be okay. The one abnormal finding on his CBC was a hemoglobin of 12.7. Subsequently I did also review his sed rate and C-reactive protein. His sed rate measured in at normal level measuring at 26. The C-reactive  protein however was elevated today and an abnormal range indicating inflammation. Still I feel like he is trending in a good direction at this time. He is going to need a refill of the Levaquin he tells me at this point. 07/04/2019 on evaluation today patient actually appears to be doing well in regard to his wound.  He has been tolerating the dressing changes without complication. Fortunately there is no signs of active infection which is good news. No fevers, chills, nausea, vomiting, or diarrhea. The good news also is that I did review his labs and though his sed rate was slightly elevated the C-reactive protein was actually down compared to the last evaluation 2 weeks ago this is excellent news I think it is showing signs of improvement. Electronic Signature(s) Signed: 07/04/2019 5:13:00 PM By: Worthy Keeler [name] Entered By: Worthy Keeler on 07/04/2019 17:13:00 TRAMONE, BENTZEL ([id number]) -------------------------------------------------------------------------------- Physical Exam Details Patient Name: [name] Date of Service: 07/04/2019 10:15 AM Medical Record Number: [id number] Patient Account Number: [id number] Date of Birth/Sex: 1950-12-18 (68 y.o. M) Treating RN: Harold Barban Primary Care Provider: [name] Other Clinician: [name] Referring Provider: [name] Treating Provider/Extender: [name], Day Deery Weeks in Treatment: 44 Constitutional Well-nourished and well-hydrated in no acute distress. Respiratory normal breathing without difficulty. Psychiatric this patient is able to make decisions and demonstrates good insight into disease process. Alert and Oriented x 3. pleasant and cooperative. Notes Patient's wound currently did require some sharp debridement to clear away some hyper granular tissue which was viable but very poor at this point. He tolerated this without complication and post debridement the wound appears to be  doing much better. I did achieve hemostasis with chemical cauterization using silver nitrate. Electronic Signature(s) Signed: 07/04/2019 5:13:29 PM By: Worthy Keeler [name] Entered By: Worthy Keeler on 07/04/2019 17:13:28 [name], [name] ([id number]) -------------------------------------------------------------------------------- Physician Orders Details Patient Name: [name] Date of Service: 07/04/2019 10:15 AM Medical Record Number: [id number] Patient Account Number: [id number] Date of Birth/Sex: 29-Feb-1951 (68 y.o. 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 - in office o Cleanse wound with mild soap and water Skin Barriers/Peri-Wound Care o Skin Prep Primary Wound Dressing Wound #1 Right Gluteal fold o Silver Collagen - under NPWT o Other: - in clinic - plain alginate, gauze and tape Dressing Change Frequency Wound #1 Right Gluteal fold o Change Dressing Monday, Wednesday, Friday Follow-up Appointments Wound #1 Right Gluteal fold o Return Appointment in 1 week. Off-Loading Wound #1 Right Gluteal fold o Turn and reposition every 2 hours Home Health Wound #1 Right Gluteal fold o [place] Nurse may visit PRN to address patientos wound care needs. o FACE TO FACE ENCOUNTER: MEDICARE and MEDICAID PATIENTS: I certify  that this patient is under my care and that I had a face-to-face encounter that meets the physician face-to-face encounter requirements with this patient on this date. The encounter with the patient was in whole or in part for the following [place] ([id number]) CONDITION: (primary reason for Home Healthcare) MEDICAL NECESSITY: I certify, that based on my findings, NURSING services are a medically necessary home health service. HOME BOUND STATUS: I certify that my clinical findings support that this patient is homebound (i.e., Due to illness or injury, pt requires aid of supportive devices such as crutches, cane, wheelchairs,  walkers, the use of special transportation or the assistance of another person to leave their place of residence. There is a normal inability to leave the home and doing so requires considerable and taxing effort. Other absences are for medical reasons / religious services and are infrequent or of short duration when for other reasons). o If current dressing causes regression in wound condition, may D/C ordered dressing product/s and apply Normal Saline Moist Dressing daily until next [place] / Other MD appointment. [place] of regression in wound condition at [phone]. o Please direct any NON-WOUND related issues/requests for orders to patient's Primary Care Physician Negative Pressure Wound Therapy Wound #1 Right Gluteal fold o Wound VAC settings at 125/130 mmHg continuous pressure. Use BLACK/GREEN foam to wound cavity. Use WHITE foam to fill any tunnel/s and/or undermining. Change VAC dressing 3 X WEEK. Change canister as indicated when full. Nurse may titrate settings and frequency of dressing changes as clinically indicated. o Home Health Nurse may d/c VAC for s/s of increased infection, significant wound regression, or uncontrolled drainage. [place] at [phone]. o Apply contact  layer over base of wound. - apply prisma/silver collagen to the base of the wound o Number of foam/gauze pieces used in the dressing = Hyperbaric Oxygen Therapy Wound #1 Right Gluteal fold o Evaluate for HBO Therapy o Indication: - Right ischial osteomyelitis o If appropriate for treatment, begin HBOT per protocol: o 2.0 ATA for 90 Minutes without Air Breaks o One treatment per day (delivered Monday through Friday unless otherwise specified in Special Instructions below): o Total # of Treatments: - 60 Electronic Signature(s) Signed: 07/04/2019 8:45:50 AM By: Montey Hora Signed: 07/04/2019 5:30:09 PM By: Worthy Keeler [name] Entered By: Montey Hora on 07/04/2019 08:45:50 TAMARI, WEHRI ([id number]) -------------------------------------------------------------------------------- Problem List Details Patient Name: [name] Date of Service: 07/04/2019 10:15 AM Medical Record Number: [id number] Patient Account Number: [id number] Date of Birth/Sex: [birth year] (68 y.o. M) Treating RN: Harold Barban Primary Care Provider: [name] Other Clinician: [name] Referring Provider: [name] Treating Provider/Extender: [name], Raychel Dowler Weeks in Treatment: 4 Active Problems ICD-10 Evaluated Encounter Code Description Active Date Today Diagnosis L89.314 Pressure ulcer of right buttock, stage 4 11/30/2017 No Yes M86.68 Other chronic osteomyelitis, other site 04/08/2019 No Yes L03.317 Cellulitis of buttock 06/21/2018 No Yes G82.20 Paraplegia, unspecified 11/30/2017 No Yes S34.109S Unspecified injury to unspecified level of lumbar spinal cord, 11/30/2017 No Yes sequela I10 Essential (primary) hypertension 11/30/2017 No Yes Inactive Problems Resolved Problems Electronic Signature(s) Signed: 07/04/2019 8:32:29 AM By: Worthy Keeler [name] Entered By: Worthy Keeler on 07/04/2019 08:32:29 MAESYN, ISSAC  ([id number]) -------------------------------------------------------------------------------- Progress Note Details Patient Name: [name] Date of Service: 07/04/2019 10:15 AM Medical Record Number: [id number] Patient Account Number: [id number] Date of Birth/Sex: [birth year] (68 y.o. 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 He states that there is no history of diabetes that he is aware of. He does have issues with his prostate and is currently receiving  treatment for this by way of oral medication. With that being said I do not have a lot of details in that regard. Nonetheless the patient presents today as a result of having been referred to [place] by another provider initially home health was set to come out and take care of his wound although due to the fact that he apparently drives he's not able to receive home health. His wife is therefore trying to help take care of this wound within although they have been struggling with what exactly to do at this point. She states that she can do some things but she is definitely not a nurse and does have some issues with looking at blood. The good news is the wound does not appear to be too deep and is fairly superficial at this point. There is no slough noted there is some nonviable skin noted around the surface of the wound and the perimeter at this point. The central portion of the wound appears to be  very good with a dermal layer noted this does not appear to be again deep enough to extend it to subcutaneous tissue at this point. 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 In the pump he has Dilaudid and baclofen. 12/07/17 on evaluation today patient presents for follow-up concerning his ongoing lower back thigh ulcer on the right. He states that he did not get the supplies ordered and therefore has not really been able to perform the dressing changes as directed exactly. His wife was able to get some Boarder Foam Dressing's from the drugstore and subsequently has been using hydrogel which did help to a degree in the wound does appear to be able smaller. There is actually more drainage this week noted than previous. 12/21/17 on evaluation today patient appears to be doing rather well in regard to his right gluteal ulcer. He has been tolerating the dressing changes without complication. There does not appear to be any evidence of infection at this point in time. Overall the wound does seem to be making some progress as far as the edges are concerned there's not as much in the way of overlapping of the external wound edges and he has a good epithelium to wound bed border for the most part. This however is not true right at the 12 o'clock location over the span of a little over a centimeters which actually will require debridement today to clean this away and hopefully allow it to continue to heal more appropriately. 12/28/17 on evaluation today patient appears to be doing rather well in regard to his ulcer in the left gluteal region. He's been tolerating the dressing changes without complication. Apparently he has had some difficulty getting his dressing material. Apparently there's been some confusion with ordering we're gonna check into this. Nonetheless overall he's been showing signs of  improvement which is good news. Debridement is not required today. 01/04/18 on evaluation today patient presents for follow-up concerning his right gluteal ulcer. He has been tolerating the dressing changes fairly well. On inspection today it appears he may actually have some maceration them concerned about the fact that he may be developing too much moisture in and around the wound bed which can cause delay in healing. With that being said he unfortunately really has not showed significant signs of improvement since last week's evaluation in fact this may even be just the little  bit/slightly larger. Nonetheless he's been having a lot of discomfort I'm not sure this is even related to the wound as he has no pain when I'm to breeding or otherwise cleaning the wound during evaluation today. Nonetheless this is something that we did recommend he talked to his pain specialist concerning. 01/11/18 on evaluation today patient appears to be doing better in regard to his ulceration. He has been tolerating the dressing Plamondon, Donjuan J. ([id number]) changes without complication. With that being said overall there's no evidence of infection which is good news. The only thing is he did receive the hatch affair blue classic versus the ready nonetheless I feel like this is perfectly fine and appears to have done well for him over the past week. 01/25/18 on evaluation today patient's wound actually appears to be a little bit larger than during the last evaluation. The good news is the majority of the wound edges actually appear to be fairly firmly attached to the wound bed unfortunately again we're not really making progress in regard to the size. Roughly the wound is about the same size as when I first saw him although again the wound margin/edges appear to be much better. 02/01/18 on evaluation today patient actually appears to be doing very well in regard to his wound. Applying the Prisma dry does seem to be  better although he does still have issues with slow progression of the wound. There was a slight improvement compared to last week's measurements today. Nonetheless I have been considering other options as far as the possibility of Theraskin or even a snap vac. In general I'm not sure that the Theraskin due to location of the wound would be a very good idea. Nonetheless I do think that a snap vac could be a possibility for the patient and in fact I think this could even be an excellent way to manage the wound possibly seeing some improvement in a very rapid fashion here. Nonetheless this is something that we would need to get approved and I did have a lengthy conversation with the patient about this today. 02/08/18 on evaluation today patient appears to be doing a little better in regard to his ulcer. He has been tolerating the dressing changes without complication. Fortunately despite the fact that the wound is a little bit smaller it's not significantly so unfortunately. We have discussed the possibility of a snap vac we did check with insurance this is actually covered at this point. Fortunately there does not appear to be any sign of infection. Overall I'm fairly pleased with how things seem to be appearing at this point. 02/15/18 on evaluation today patient appears to be doing rather well in regard to his right gluteal ulcer. Unfortunately the snap vac did not stay in place with his sheer and friction this came loose and did not seem to maintain seal very well. He worked for about two days and it did seem to do very well during that time according to his wife but in general this does not seem to be something that's gonna be beneficial for him long-term. I do believe we need to go back to standard dressings to see if we can find something that will be of benefit. 03/02/18- He is here in follow up evaluation; there is minimal change in the wound. He will continue with the same treatment plan, would  consider changing to iodosrob/iodoflex if ulcer continues to to plateau. He will follow up next week 03/08/18 on evaluation today patient's wound  actually appears to be about the same size as when I previously saw him several weeks back. Unfortunately he does have some slightly dark discoloration in the central portion of the wound which has me concerned about pressure injury. I do believe he may be sitting for too long a period of time in fact he tells me that "I probably sit for much too long". He does have some Slough noted on the surface of the wound and again as far as the size of the wound is concerned I'm really not seeing anything that seems to have improved significantly. 03/15/18 on evaluation today patient appears to be doing fairly well in regard to his ulcer. The wound measured pretty much about the same today compared to last week's evaluation when looking at his graph. With that being said the area of bruising/deep tissue injury that was noted last week I do not see at this point. He did get a new cushion fortunately this does seem to be have been of benefit in my pinion. It does appear that he's been off of this more which is good news as well I think that is definitely showing in the overall wound measurements. With that being said I do believe that he needs to continue to offload I don't think that the fact this is doing better should be or is going to allow him to not have to offload and explain this to him as well. Overall he seems to be in agreement the plan I think he understands. The overall appearance of the wound bed is improved compared to last week I think the Iodoflex has been beneficial in that regard. 03/29/18 on evaluation today patient actually appears to be doing rather well in regard to his wound from the overall appearance standpoint he does have some granulation although there's some Slough on the surface of the wound noted as well. With that being said he  unfortunately has not improved in regard to the overall measurement of the wound in volume or in size. I did have a discussion with him very specifically about offloading today. He actually does work although he mainly is just sitting throughout the day. He tells me he offloads by "lifting himself up for 30 seconds off of his chair occasionally" purchase from [employer] [facility] which does seem to have helped. And he has a new cushion that he with that being said he's also able to stand some for a very short period of time but not significant enough I think to provide appropriate offloading. I think the biggest issue at this point with the wound and the fact is not healing as quickly as we would like is due to the fact that he is really not able to appropriately offload while at work. He states the beginning after his injury he actually had a bed at his job that he could lay on in order to offload and that does seem to have been of help back at that time. Nonetheless he had not done this in quite some time unfortunately. I think that could be helpful for him this is something I would like for him to look into. KASHMERE, COVIN ([id number]) 04/05/18 on evaluation today patient actually presents for follow-up concerning his right gluteal ulcer. Again he really is not significantly improved even compared to last week. He has been tolerating the dressing changes without complication. With that being said fortunately there appears to be no evidence of infection at this time. He has been  more proactive in trying to offload. 04/12/18 on evaluation today patient actually appears to be doing a little better in regard to his wound and the right gluteal fold region. He's been tolerating the dressing changes since removing the oasis without complication. However he was having a lot of burning initially with the oasis in place. He's unsure of exactly why this was given so much discomfort but he assumes that it  was the oasis itself causing the problem. Nonetheless this had to be removed after about three days in place although even those three days seem to have made a fairly good improvement in regard to the overall appearance of the wound bed. In fact is the first time that he's made any improvement from the standpoint of measurements in about six weeks. He continues to have no discomfort over the area of the wound itself which leads me to wonder why he was having the burning with the oasis when he does not even feel the actual debridement's themselves. I am somewhat perplexed by this. 04/19/18 on evaluation today patient's wound actually appears to be showing signs of epithelialization around the edge of the wound and in general actually appears to be doing better which is good news. He did have the same burning after about three days with applying the Endoform last week in the same fashion that I would generally apply a skin substitute. This seems to indicate that it's not the oasis to cause the problem but potentially the moisture buildup that just causes things to burn or there may be some other reaction with the skin prep or Steri-Strips. Nonetheless I'm not sure that is gonna be able to tolerate any skin substitute for a long period of time. The good news is the wound actually appears to be doing better today compared to last week and does seem to finally be making some progress. 04/26/18 on evaluation today patient actually appears to be doing rather well in regard to his ulcer in the right gluteal fold. He has been tolerating the dressing changes without complication which is good news. The Endoform does seem to be helping although he was a little bit more macerated this week. This seems to be an ongoing issue with fluid control at this point. Nonetheless I think we may be able to add something like Drawtex to help control the drainage. 05/03/18 on evaluation today patient appears to actually be  doing better in regard to the overall appearance of his wound. He has been tolerating the dressing changes without complication. Fortunately there appears to be no evidence of infection at this time. I really feel like his wound has shown signs as of today of turning around last week I thought so as well and definitely he could be seen in this week's overall appearance and measurements. In general I'm very pleased with the fact that he finally seems to be making a steady but sure progress. The patient likewise is very pleased. 05/17/18 on evaluation today patient appears to be doing more poorly unfortunately in regard to his ulcer. He has been tolerating the dressing changes without complication. With that being said he tells me that in the past couple of days he and his wife have noticed that we did not seem to be doing quite as well is getting dark near the center. Subsequently upon evaluation today the wound actually does appear to be doing worse compared to previous. He has been tolerating the dressing changes otherwise and he states that he is  not been sitting up anymore than he was in the past from what he tells me. Still he has continued to work he states "I'm tired of dealing with this and if I have to just go home and lay in the bed all the time that's what I'll do". Nonetheless I am concerned about the fact that this wound does appear to be deeper than what it was previous. 05/24/18 upon evaluation today patient actually presents after having been in the hospital due to what was presumed to be sepsis secondary to the wound infection. He had an elevated white blood cell count between 14 and 15. With that being said he does seem to be doing somewhat better now. His wound still is giving him some trouble nonetheless and he is obviously concerned about the fact likely talked about that this does seem to go more deeply than previously noted. I did review his wound culture which showed evidence  of Staphylococcus aureus him and group B strep. Nonetheless he is on antibiotics, Levaquin, for this. Subsequently I did review his intake summary from the hospital as well. I also did look at the CT of the lumbar spine with contrast that was performed which showed no bone destruction to suggest lumbar disguises/osteomyelitis or sacral osteomyelitis. There was no paraspinal abscess. Nonetheless it appears this may have been more of just a soft tissue infection at this point which is good news. He still is nonetheless concerned about the wound which again I think is completely reasonable considering everything he's been through recently. 05/31/18 on evaluation today on evaluation today patient actually appears to be showing signs of his wound be a little bit deeper than what I would like to see. Fortunately he does not show any signs of significant infection although his temperature was 99 today he states he's been checking this at home and has not been elevated. Nonetheless with the undermining that I'm seeing at this point I am becoming more concerned about the wound I do think that offloading is a key factor here that is preventing the speedy recovery at this point. There does not appear to be any evidence of again over infection noted. He's been using Santyl currently. NEWEL, CLEERE ([id number]) 06/07/18 the patient presents today for follow-up evaluation regarding the left ulcer in the gluteal region. He has been tolerating the Wound VAC fairly well. He is obviously very frustrated with this he states that to mean is really getting in his way. There does not appear to be any evidence of infection at this time he does have a little bit of odor I do not necessarily associate this with infection just something that we sometimes notice with Wound VAC therapy. With that being said I can definitely catch a tone of discontentment overall in the patient's demeanor today. This when he was  previously in the hospital an CT scan was done of the lumbar region which did not reveal any signs of osteomyelitis. With that being said the pelvis in particular was not evaluated distinctly which means he could still have some osteonecrosis I. Nonetheless the Wound VAC was started on Thursday I do want to get this little bit more time before jumping to a CT scan of the pelvis although that is something that I might would recommend if were not see an improvement by that time. 06/14/18 on evaluation today patient actually appears to be doing about the same in regard to his right gluteal ulcer. Again he did have a CT  scan of the lumbar spine unfortunately this did not include the pelvis. Nonetheless with the depth of the wound that I'm seeing today even despite the fact that I'm not seeing any evidence of overt cellulitis I believe there's a good chance that we may be dealing with osteomyelitis somewhere in the right Ischial region. No fevers, chills, nausea, or vomiting noted at this time. 06/21/18 on evaluation today patient actually appears to be doing about the same with regard to his wound. The tunnel at 6 o'clock really does not appear to be any deeper although it is a little bit wider. I think at this point you may want to start packing this with white phone. Unfortunately I have not got approval for the CT scan of the pelvis as of yet due to the fact that Medicare apparently has been denied it due to the diagnosis codes not being appropriate according to Medicare for the test requested. With that being said the patient cannot have an MRI and therefore this is the only option that we have as far as testing is concerned. The patient has had infection and was on antibiotics and been added code for cellulitis of the bottom to see if this will be appropriate for getting the test approved. Nonetheless I'm concerned about the infection have been spread deeper into the Ischial region. 06/28/18 on  evaluation today patient actually appears to be doing rather well all things considered in regard to the right gluteal ulcer. He has been tolerating the dressing changes without complication. With that being said the Wound VAC he states does have to be replaced almost every day or at least reinforced unfortunately. Patient actually has his CT scan later this morning we should have the results by tomorrow. 07/05/18 on evaluation today patient presents for follow-up concerning his right Ischial ulcer. He did see the surgeon Dr. Lysle Pearl last week. They were actually very happy with him and felt like he spent a tremendous amount of time with them as far as discussing his situation was concerned. In the end Dr. Lysle Pearl did contact me as well and determine that he would not recommend any surgical intervention at this point as he felt like it would not be in the patient's best interest based on what he was seeing. He recommended a referral to infectious disease. Subsequently this is something that Dr. Ines Bloomer office is working on setting up for the patient. As far as evaluation today is concerned the patient's wound actually appears to be worse at this point. I am concerned about how things are progressing and specifically about infection. I do not feel like it's the deeper but the area of depth is definitely widening which does have me concerned. No fevers, chills, nausea, or vomiting noted at this time. I think that we do need initiate antibiotic therapy the patient has an allow allergy to amoxicillin/penicillin he states that he gets a rash since childhood. Nonetheless she's never had the issues with Catholics or cephalosporins in general but he is aware of. 07/27/18 on evaluation today patient presents following admission to the hospital on 07/09/18. He was subsequently discharged on 07/20/18. On 07/15/18 the patient underwent irrigation and debridement was soft tissue biopsy and bone biopsy as well  as placement of a Wound VAC in the OR by Dr. Celine Ahr. During the hospital course the patient was placed on a Wound VAC and recommended follow up with surgery in three weeks actually with Dr. Delaine Lame who is infectious disease. The patient was on  vancomycin during the hospital course. 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 He also had a bone culture which revealed evidence of methicillin-resistant staph aureus. He is updated CT scan 07/09/18 reveals that he had progression of the which was performed on wound to breakdown down to the trochanter where he actually had irregularities there as well suggestive of osteomyelitis. This was a change just since 9 December when we last performed a CT scan. Obviously this one had gone downhill quite significantly and rapidly. At this point upon evaluation I feel like in general the patient's wound seems to be doing fairly well all things considered upon my evaluation today. Obviously this is larger and deeper than what I previously evaluated but at the same time he seems to be making some progress as far as the appearance of the granulation tissue is concerned. I'm happy in that regard. No fevers, chills, nausea, or vomiting noted at this time. He is on IV vancomycin and Rocephin at the facility. He is currently in [facility]. 08/03/18 upon evaluation today patient's wound appears to be doing better in regard to the overall appearance at this point in time. Fortunately he's been tolerating the Wound VAC without complication and states that the facility has been taking VINICIUS, GERKE. ([id number]) excellent care of the wound site. Overall I see some Slough noted on the surface which I am going to attempt sharp debridement today of but nonetheless other than this I feel like he's making progress. 08/09/18 on evaluation today patient's wound appears to be doing much better compared to even last week's evaluation.  Do believe that the Wound VAC is been of great benefit for him. He has been tolerating the dressing changes that is the Wound VAC without any complication and he has excellent granulation noted currently. There is no need for sharp debridement at this point. 08/16/18 on evaluation today patient actually appears to be doing very well in regard to the wound in the right gluteal fold region. This is showing signs of progress and again appears to be very healthy which is excellent news. Fortunately there is no sign of active infection by way of odor or drainage at this point. Overall I'm very pleased with how things stand. He seems to be tolerating the Wound VAC without complication. 08/23/18 on evaluation today patient actually appears to be doing better in regard to his wound. He has been tolerating the Wound VAC without complication and in fact it has been collecting a significant amount of drainage which I think is good news especially considering how the wound appears. Fortunately there is no signs of infection at this time definitely nothing appears to be worse which is good news. He has not been started on the Bactrim and Flagyl that was recommended by Dr. Delaine Lame yet. I did actually contact her office this morning in order to check and see were things are that regard their gonna be calling me back. 08/30/18 on evaluation today patient actually appears to show signs of excellent improvement today compared to last evaluation. The undermining is getting much better the wound seems to be feeling quite nicely and I'm very pleased that the granulation in general. With that being said overall I feel like the patient has made excellent progress which is great news. No fevers, chills, nausea, or vomiting noted at this time. 09/06/18 on evaluation today patient actually appears to be doing rather well in regard to his right gluteal ulcer. This is  showing signs of improvement in overall I'm very pleased  with how things seem to be progressing. The patient likewise is please. Overall I see no evidence of infection he is about to complete his oral antibiotic regimen which is the end of the antibiotics for him in just about three days. 09/13/18 on evaluation today patient's right Ischial ulcer appears to be showing signs of continued improvement which is excellent news. He's been tolerating the dressing changes without complication. Fortunately there's no signs of infection and the wound that seems to be doing very well. 09/28/18 on evaluation today patient appears to be doing rather well in regard to his right Ischial ulcer. He's been tolerating the Wound VAC without complication he knows there's much less drainage than there used to be this obviously is not a bad thing in my pinion. There's no evidence of infection despite the fact is but nothing about it now for several weeks. 10/04/18 on evaluation today patient appears to be doing better in regard to his right Ischial wound. He has been tolerating the Wound VAC without complication and I do believe that the silver nitrate last week was beneficial for him. Fortunately overall there's no evidence of active infection at this time which is great news. No fevers, chills, nausea, or vomiting noted at this time. 10/11/18 on evaluation today patient actually appears to be doing rather well in regard to his Ischial ulcer. He's been tolerating the Wound VAC still without complication I feel like this is doing a good job. No fevers, chills, nausea, or vomiting noted at this time. 11/01/18 on evaluation today patient presents after having not been seen in our clinic for several weeks secondary to the fact that he was on evaluation today patient presents after having not been seen in our clinic for several weeks secondary to the fact that he was in a skilled nursing facility which was on lockdown currently due to the covert 19 national emergency. Subsequently he  was discharged from the facility on this past Friday and subsequently made an appointment to come in to see yesterday. Fortunately there's no signs of active infection at this time which is good news and overall he does seem to have made progress since I last saw. Overall I feel like things are progressing quite nicely. The patient is having no pain. 11/08/18 on evaluation today patient appears to be doing okay in regard to his right gluteal ulcer. He has been utilizing a Wound VAC home health this changing this at this point since he's home from the skilled nursing facility. Fortunately there's no signs of obvious active infection at this time. Unfortunately though there's no obvious active infection he is having some maceration and his wife states that when the sheets of the Wound VAC office on Sunday when it broke seal that he ended up having significant issues with some smell as well there concerned about the possibility of infection. Fortunately there's No fevers, chills, nausea, or vomiting noted at this time. [name], [name] ([id number]) 11/15/18 on evaluation today patient actually appears to be doing well in regard to his right gluteal ulcer. He has been tolerating the dressing changes without complication. Specifically the Wound VAC has been utilized up to this point. Fortunately there's no signs of infection and overall I feel like he has made progress even since last week when I last saw him. I'm actually fairly happy with the overall appearance although he does seem to have somewhat of a hyper granular overgrowth in  the central portion of the wound which I think may require some sharp debridement to try flatness out possibly utilizing chemical cauterization following. 11/23/18 on evaluation today patient actually appears to be doing very well in regard to his sacral ulcer. He seems to be showing signs of improvement with good granulation. With that being said he still has the small area  of hyper granulation right in the central portion of the wound which I'm gonna likely utilize silver nitrate on today. Subsequently he also keeps having a leak at the 6 o'clock location which is unfortunate we may be able to help out with some suggestions to try to prevent this going forward. Fortunately there's no signs of active infection at this time. 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 He's been tolerating the dressing changes without complication. Fortunately there's no signs of active infection at this time. I've been rather pleased with how things have progressed there still some evidence of pressure getting to the area with some redness right around the immediate wound opening. Nonetheless other than this I'm not seeing any significant complications or issues the wound is somewhat hyper granular. Upon discussing with the patient and his wife today I'm not sure that the wound is being packed to the base with the foam at this point. And if it's not been packed fully that may be part of the reason why is not seen as much improvement as far as the granulation from the base out. Again we do not want pack too tightly but we need some of the firm to get to the base of the wound. I discussed this with patient and his wife today. 12/06/18 on evaluation today patient appears to be doing well in regard to his right gluteal pressure ulcer. He's been tolerating the dressing changes without complication. Fortunately there's no signs of active infection. He still has some hyper granular tissue and I do think it would be appropriate to continue with the chemical cauterization as of today. 12/16/18 on evaluation today patient actually appears to be doing okay in regard to his right gluteal ulcer. He is been tolerating the dressing changes without complication including the Wound VAC. Overall I feel like  nothing seems to be worsening I do feel like that the hyper granulation buds in the central portion of the wound have improved to some degree with the silver nitrate. We will have to see how things continue to progress. 12/20/18 on evaluation today patient actually appears to be doing much worse in my pinion even compared to last week's evaluation. Unfortunately as opposed to showing any signs of improvement the areas of hyper granular tissue in the central portion of the wound seem to be getting worse. Subsequently the wound bed itself also seems to be getting deeper even compared to last week which is both unusual as well as concerning since prior he had been shown signs of improvement. Nonetheless I think that the issue could be that he's actually having some difficulty in issues with a deeper infection. There's no external signs of infection but nonetheless I am more worried about the internal, osteomyelitis, that could be restarting. He has not been on antibiotics for some time at this point. I think that it may be a good idea to go ahead and started back on an antibiotic therapy while we wait to see what the testing shows. 12/27/18 on evaluation today  patient presents for follow-up concerning his left gluteal fold wound. Fortunately he appears to be doing well today. I did review the CT scan which was negative for any signs of osteomyelitis or acute abnormality this is excellent news. Overall I feel like the surface of the wound bed appears to be doing significantly better today compared to previously noted findings. There does not appear any signs of infection nor does he have any pain at this time. 01/03/19 on evaluation today patient actually appears to be doing quite well in regard to his ulcer. Post debridement last week he really did not have too much bleeding which is good news. Fortunately today this seems to be doing some better but we still has some of the hyper granular tissue noted in  the base of the wound which is gonna require sharp debridement today as well. Overall I'm pleased with how things seem to be progressing since we switched away from the Wound VAC I think he is making some progress. 01/10/19 on evaluation today patient appears to be doing better in regard to his right gluteal fold ulcer. He has been tolerating the dressing changes without complication. The debridement to seem to be helping with current away some of the poor hyper granular tissue bugs throughout the region of his gluteal fold wound. He's been tolerating the dressing changes otherwise without complication which is great news. No fevers, chills, nausea, or vomiting noted at this time. 01/17/19 on evaluation today patient actually appears to be doing excellent in regard to his wound. He's been tolerating the dressing changes without complication. Fortunately there is no signs of active infection at this time which is great news. No fevers, chills, nausea, or vomiting noted at this time. ZAYSHAWN, RANKIN ([id number]) 01/24/19 on evaluation today patient actually appears to be doing quite well with regard to his ulcer. He has been tolerating the dressing changes without complication. Fortunately there's no signs of active infection at this time. Overall been very pleased with the progress that he seems to be making currently. 01/31/19-Patient returns at 1 week with apparent similarity in dimensions to the wound, with no signs of infection, he has been changing dressings twice a day 02/08/19 upon evaluation today patient actually appears to be doing well with regard to his right Ischial ulcer. The wound is not appear to be quite as deep and seems to be making progress which is good news. With that being said I'm still reluctant to go back to the Wound VAC at this point. He's been having to change the dressings twice a day which is a little bit much in my pinion from the wound care supplies standpoint. I  think that possibly attempting to utilize extras orbit may be beneficial this may also help to prevent any additional breakdown secondary to fluid retention in the wound itself. The patient is in agreement with giving this a try. 02/15/19 on evaluation today patient actually appears to be doing decently well with regard to his ulcer in the right to gluteal fold location. He's been tolerating the dressing changes without complication. Fortunately there is no signs of active infection at this time. He is able to keep the current dressing in place more effectively for a day at a time whereas before he was having a changes to to three times a day. The actions or has been helpful in this regard. Fortunately there's no signs of anything getting worse and I do feel like he showing signs of good improvement with  regard to the wound bed status. 02/22/2019 patient appears to be doing very well today with regard to his ulcer in the gluteal fold. Fortunately there is no signs of active infection and he has been tolerating the dressing changes without any complication. Overall extremely pleased with how things seem to be progressing. He has much less of the hyper granular projections within the wound these have slowly been debrided away and he seems to be doing well. The wound bed is more uniform. 03/01/19 on evaluation today patient appears to be doing unfortunately about the same in regard to his gluteal ulcer. He's been tolerating the dressing changes without complication. Fortunately there's no signs of active infection at this time. With that being said he continues to develop these hyper granular projections which I'm unsure of exactly what they are and why they are rising. Nonetheless I explained to the patient that I do believe it would be a good idea for [place] to stand a biopsy sample for pathology to see if that can shed any light on what exactly may be going on here. Fortunately I do not see any obvious  signs of infection. With that being said the patient has had a little bit more drainage this week apparently compared to last week. 03/08/2019 on evaluation today patient actually appears to look somewhat better with regard to the appearance of his wound bed at this time. This is good news. Overall I am very pleased with how things seem to have progressed just in the past week with a switch to the [facility] dressing. I think that has been beneficial for him. With that being said at this time the patient is concerned about his biopsy that I sent off last week unfortunately I do not have that report as of yet. Nonetheless we have called to obtain this and hopefully will hear back from the lab later this morning. 03/15/19 on evaluation today patient's wound actually appears to be doing okay today with regard to the overall appearance of the wound bed. He has been tolerating the dressing changes without complication he still has hyper granular tissue noted but fortunately that seems to be minimal at this point compared to some of what we've seen in the past. Nonetheless I do think that he is still having some issues currently with some of this type of granulation the biopsy and since all showed nothing more than just evidence of granulation tissue. Therefore there really is nothing different to initiate or do at this point. 03/24/19 on evaluation today patient appears to be doing a little better with regard to his ulcer. He's been tolerating the dressing changes without complication. Fortunately there is no signs of active infection at this time. No fevers, chills, nausea, or vomiting noted at this time. I'm overall pleased with how things seem to be progressing. 03/29/2019 on evaluation today patient appears to be doing about the same in regard to his ulcer in the right gluteal fold. Unfortunately he is not seeming to make a lot of progress and the wound is somewhat stalled. There is no signs of  active infection externally but I am concerned about the possibility of infection continuing in the ischial location which previously he did have infection noted. Again is not able to have an MRI so probably our best option for testing for this would be a triple phase bone scan which will detect subtle changes in the bone more so than plain film x-rays. In the past they really have  not been beneficial and in fact the CT scans even have been somewhat questionable at times. Nonetheless there is no signs of systemic infection which is at least good news but again his wound is not healing at all on the predicted schedule. 04/05/19 on evaluation today patient appears to be doing well all things considering with regard to his wound and the right gluteal fold. He actually has his triple bone scan scheduled for sometime in the next couple of days. With that being said I've also been looking to other possibilities of what could be causing hyper granular tissue were looking into the bone scan again for evaluating for the risk or possibility of infection deeper and I'm also gonna go ahead and see about obtaining a deep tissue Helbing, Alvis J. ([id number]) culture today to send and see if there's any evidence of infection noted on culture. He's in agreement with that plan. 04/12/2019 on evaluation today patient presents for reevaluation here in our clinic concerning ongoing issues with his right gluteal fold ulcer. 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 It was discussed with him at that point that I think it would be appropriate for him to proceed with hyperbaric oxygen therapy especially in light of the fact that he is previously been on IV antibiotics at the beginning of the year for close to 3 months followed by several weeks of oral antibiotics that was all prescribed by infectious  disease. He had surgical debridement around Christmas December 2019 due to an abscess and osteomyelitis of the ischial bone. Unfortunately this has not really proceeded as well as we would have liked and again we did a CT scan even a couple months ago as he cannot have an MRI secondary to having issues with both a pain pump as well as a spinal cord stimulator which prevent him from going into an MRI machine. With that being said there were chronic changes noted at the ischial tuberosity which had progressed since December 2019 there was no evidence of fluid collection on that initial CT scan. With that being said that was on January 21, 2019. I am not sure that it was a sensitive enough test as compared to an MRI and then subsequently I ordered a bone scan for him which was actually completed on 03/29/2019 and this revealed that he does indeed have positive osteomyelitis involving the right ischial tuberosity. This is adjacent to the ulcer and I think is the reason that his ulcer is not healing. Subsequently I am in a place him back on oral antibiotics today unfortunately his wound culture showed just mixed gram-negative flora with no specific findings of a predominant organism. Nonetheless being with the location I think a good broad-spectrum antibiotic for gram-negative's is a good choice at this point he is previously taken Levaquin without significant issues and I think that is an appropriate antibiotic for him at this time. 04/19/2019 on evaluation today patient actually appears to be doing fairly well with regard to his wound. He has been tolerating the dressing changes without complication. Fortunately there is no signs of active infection and he has been taking the oral antibiotics at this time. Subsequently we did make a referral to infectious disease although Dr. Steva Ready wants the patient to be seen by general surgery first in order apparently to see if there is anything from a surgical  standpoint that should  be done prior to initiation of IV antibiotic therapy. Again the patient is okay with actually seeing Dr. Celine Ahr whom he has seen before we will make that referral. 04/26/2019 on evaluation today patient actually appears to be doing well with regard to his wound. He has been tolerating the dressing changes without complication. Fortunately there is no signs of active infection at this time. I do believe that the hyperbaric oxygen therapy along with the antibiotics which I prescribed at this point have been doing well for him. With that being said he has not seen Dr. Celine Ahr the surgeon yet in fact they have not been contacted for scheduling an appointment as of yet. Subsequently the patient is not on antibiotics currently by IV Dr. Steva Ready did want him to see Dr. Celine Ahr first which we are working on trying to get scheduled. We again give the information to the patient today for Dr. Celine Ahr and her number as far as contacting their office to see about getting something scheduled. Again were looking for whether or not they recommend any surgical intervention. 05/03/2019 on evaluation today patient appears to be doing about the same with regard to his wound there may be a little bit of filling in in regard to the base of the wound but still he has quite a bit of hyper granular tissue that I am seeing today. I believe that he may may need a more aggressive sharp debridement possibly even by the surgeon or under anesthesia in order to clear away some of the hyper granular material in order to help allow for appropriate granulation to fill in. We have made a referral to Dr. Celine Ahr unfortunately it sounds as if they did not receive the referral we contacted them today they should be get in touch with the patient. Depending on how things show from that standpoint the patient may need to see Dr. Ardyth Gal who is the infectious disease specialist although he really does not want a PICC  line again. No fevers, chills, nausea, vomiting, or diarrhea. He is tolerating hyperbaric oxygen therapy very well. 05/12/2019 on evaluation today patient presents for follow-up visit concerning his right gluteal fold ulcer. He did see Dr. Celine Ahr the surgeon who previously evaluated his wound and she actually felt like he was doing quite well with regard to the wound based on what she was seen. He does seem to be responding to some degree with the oral antibiotics along with hyperbaric oxygen therapy at this point she did not see any evidence or need for further surgical intervention at this point. She recommended deferring additional or ongoing antibiotic therapy to Dr. Steva Ready at her discretion. Fortunately there is no signs of active systemic infection. No fevers, chills, nausea, vomiting, or diarrhea. 10/29; patient I am seeing really for the first time today in terms of his wound. 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 He is being treated with hyperbaric oxygen for underlying osteomyelitis most recently documented by a three-phase bone scan. He has been on Levaquin for roughly a month and he is out of these and asked me for a refill. Most recent cultures were negative although I do not think these were bone cultures. He has a very irregular surface to this wound which is almost nodular in texture. It undermines superiorly with the same nodular hyper granulated surface. I see that he was referred to general surgery for an operative debridement although the surgeon did not agree with this I think that  would have been the proper course of action in this. He has been using Hydrofera Blue. He is supposed to start a wound VAC tomorrow which is actually a reapplication of wound VAC. I think mostly last time they had trouble keeping the seal in place I hope we have better look at this this time. KAYCEON, AGUINO ([id number]) 05/24/2019 on evaluation today I did review  patient's note from Dr. Dellia Nims where he was seen last week when I was out of the office. Subsequently it does appear that Dr. Dellia Nims was in agreement that the patient really needed debridement to clear away some of the nodular tissue in the base of the wound. The good news is he has the wound VAC initiated and some of this tissue is already starting to breakdown under the treatment of the wound VAC again because it is not really viable. Nonetheless I do believe that we can likely perform some debridement today to clear this away and hopefully the continuation of the wound VAC along with hyperbarics and antibiotic therapy will be beneficial in stopping this from reoccurring. If we get better control in the wound bed in a good spot I think we can definitely use the PriMatrix which we actually did get approval for. 05/31/2019 on evaluation today patient appears to be doing actually pretty well at this point with regard to his wound. He has been tolerating the dressing changes which is good news. Fortunately there is no signs of infection. He still has some of the hyper granular nodules noted we have not really cleared all these away and I think we can try to do a little bit more that today. I did do quite a bit last week I am hoping to get down to the base of the wound so though actually be able to heal more appropriately. I think the wound VAC is doing a good job right now with controlling the drainage and overall the patient is happy with that. 06/07/2019 upon evaluation today patient appears to be doing quite well compared to last week with regard to the hyper granulation in the base of the wound. He has been tolerating the dressing changes without complication. Fortunately there is no signs of active infection at this time. With that being said we have not had any recent measures as far as blood work is concerned I think we may want to check a few things including a CBC, sed rate, and C-reactive  protein. Patient is in agreement with that we can try to get that scheduled for him for this Friday. 06/13/2019 on evaluation today patient actually appears to be doing much better at this point based on what I am seeing with regard to his wound. He has much less in the way of hyper granular tissue which is good news and a lot more healing that is taken place since I last saw him as far as the amount of space within the wound itself. With that being said he is nearing the completion of the initial 40 treatments for hyperbaric oxygen therapy I think this is something I would recommend extending as he does seem to be benefiting at this time. Fortunately there is no evidence of active systemic infection at this point nor even local infection for that matter. 06/20/2019 upon evaluation today patient actually appears to be doing well with regard to his wound. In fact this appears to be doing much better today compared to what it was previous. I been extremely pleased with  the progress that he is made. In fact the tunnel is much less deep today than what it was in the past and I am seeing excellent signs of improvement. Overall I am happy with how things are going. I did review his white blood cell count which revealed everything to be okay. The one abnormal finding on his CBC was a hemoglobin of 12.7. Subsequently I did also review his sed rate and C-reactive protein. His sed rate measured in at normal level measuring at 26. The C-reactive protein however was elevated today and an abnormal range indicating inflammation. Still I feel like he is trending in a good direction at this time. He is going to need a refill of the Levaquin he tells me at this point. 07/04/2019 on evaluation today patient actually appears to be doing well in regard to his wound. He has been tolerating the dressing changes without complication. Fortunately there is no signs of active infection which is good news. No fevers,  chills, nausea, vomiting, or diarrhea. The good news also is that I did review his labs and though his sed rate was slightly elevated the C-reactive protein was actually down compared to the last evaluation 2 weeks ago this is excellent news I think it is showing signs of improvement. Patient History Information obtained from Patient. Family History Hypertension - Father, Stroke - Mother, No family history of Cancer, Diabetes, Heart Disease, Kidney Disease, Lung Disease, Seizures, Thyroid Problems, Tuberculosis. Social History Never smoker, Marital Status - Married, Alcohol Use - Never, Drug Use - No History, Caffeine Use - Daily. Medical History Eyes Patient has history of Cataracts - both removed [name], [name]. ([id number]) Denies history of Glaucoma, Optic Neuritis Ear/Nose/Mouth/Throat Denies history of Chronic sinus problems/congestion, Middle ear problems Hematologic/Lymphatic Denies history of Anemia, Hemophilia, Human Immunodeficiency Virus, Lymphedema Respiratory Denies history of Aspiration, Asthma, Chronic Obstructive Pulmonary Disease (COPD), Pneumothorax, Sleep Apnea, Tuberculosis Cardiovascular Patient has history of Hypertension - takes medication Denies history of Angina, Arrhythmia, Congestive Heart Failure, Coronary Artery Disease, Deep Vein Thrombosis, Hypotension, Myocardial Infarction, Peripheral Arterial Disease, Peripheral Venous Disease, Phlebitis, Vasculitis Gastrointestinal Denies history of Cirrhosis , Colitis, Crohn s, Hepatitis A, Hepatitis B, Hepatitis C Endocrine Denies history of Type I Diabetes, Type II Diabetes Genitourinary Denies history of End Stage Renal Disease Immunological Denies history of Lupus Erythematosus, Raynaud s, Scleroderma Integumentary (Skin) Denies history of History of Burn, History of pressure wounds Musculoskeletal Denies history of Gout, Rheumatoid Arthritis, Osteoarthritis, Osteomyelitis Neurologic Patient has  history of Paraplegia - waist down Denies history of Dementia, Neuropathy, Quadriplegia, Seizure Disorder Oncologic Denies history of Received Chemotherapy, Received Radiation Psychiatric Denies history of Anorexia/bulimia, Confinement Anxiety Medical And Surgical History Notes Oncologic Prostate cancer- currently treated with horomone therapy Review of Systems (ROS) Constitutional Symptoms (General Health) Denies complaints or symptoms of Fatigue, Fever, Chills, Marked Weight Change. Respiratory Denies complaints or symptoms of Chronic or frequent coughs, Shortness of Breath. Cardiovascular Denies complaints or symptoms of Chest pain, LE edema. Psychiatric Denies complaints or symptoms of Anxiety, Claustrophobia. Objective Constitutional Well-nourished and well-hydrated in no acute distress. [name], [name] ([id number]) Vitals Time Taken: 8:15 AM, Height: 73 in, Weight: 210 lbs, BMI: 27.7, Temperature: 98.9 F, Pulse: 84 bpm, Respiratory Rate: 16 breaths/min, Blood Pressure: 142/80 mmHg. Respiratory normal breathing without difficulty. Psychiatric this patient is able to make decisions and demonstrates good insight into disease process. Alert and Oriented x 3. pleasant and cooperative. General Notes: Patient's wound currently did require some sharp debridement to clear  away some hyper granular tissue which was viable but very poor at this point. He tolerated this without complication and post debridement the wound appears to be doing much better. I did achieve hemostasis with chemical cauterization using silver nitrate. Integumentary (Hair, Skin) Wound #1 status is Open. Original cause of wound was Pressure Injury. The wound is located on the Right Gluteal fold. The wound measures 2.9cm length x 2.1cm width x 2.3cm depth; 4.783cm^2 area and 11.001cm^3 volume. There is muscle and Fat Layer (Subcutaneous Tissue) Exposed exposed. There is no undermining noted, however, there is  tunneling at 12:00 with a maximum distance of 3.5cm. There is a large amount of serous drainage noted. The wound margin is epibole. There is large (67-100%) pink, hyper - granulation within the wound bed. There is a small (1-33%) amount of necrotic tissue within the wound bed including Adherent Slough. 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 There was a Excisional Skin/Subcutaneous Tissue Debridement with a total area of 6.09 sq cm performed by [name], [place] Isadore E., [name]. With the following instrument(s): Curette to remove Viable and Non-Viable tissue/material. Material removed includes Subcutaneous Tissue, Slough, and Hyper-granulation after achieving pain control using Lidocaine 4% Topical Solution. No specimens were taken. A time out was conducted at 08:39, prior to the start of the procedure. A Moderate amount of bleeding was controlled with Silver Nitrate. The procedure was tolerated well with a pain level of 0 throughout and a pain level of 0 following the procedure. Post Debridement Measurements: 2.9cm length x 2.1cm width x 2.5cm depth; 11.958cm^3 volume. Post debridement Stage noted as Category/Stage IV. Character of Wound/Ulcer Post Debridement is improved. Post procedure Diagnosis Wound #1: Same as Pre-Procedure [name], BANUELOS ([id number]) Plan Wound Cleansing: Wound #1 Right Gluteal fold: Clean wound with Normal Saline. - in office Cleanse wound with mild soap and water Skin Barriers/Peri-Wound Care: Skin Prep Primary Wound Dressing: Wound #1 Right Gluteal fold: Silver Collagen - under NPWT Other: - in clinic - plain alginate, gauze  and tape Dressing Change Frequency: Wound #1 Right Gluteal fold: Change Dressing Monday, Wednesday, Friday Follow-up Appointments: Wound #1 Right Gluteal fold: Return Appointment in 1 week. Off-Loading: Wound #1 Right Gluteal fold: Turn and reposition every 2 hours Home Health: Wound #1 Right Gluteal fold: [place] Nurse may visit PRN to address patient s wound care needs. FACE TO FACE ENCOUNTER: MEDICARE and MEDICAID PATIENTS: I certify that this patient is under my care and that I had a face-to-face encounter that meets the physician face-to-face encounter requirements with this patient on this date. The encounter with the patient was in whole or in part for the following MEDICAL CONDITION: (primary reason for [place]) MEDICAL NECESSITY: I certify, that based on my findings, NURSING services are a medically necessary home health service. HOME BOUND STATUS: I certify that my clinical findings support that this patient is homebound (i.e., Due to illness or injury, pt requires aid of supportive devices such as crutches, cane, wheelchairs, walkers, the use of special transportation or the assistance of another person to leave their place of residence. There is a normal inability to leave the home and doing so requires considerable and taxing effort. Other absences are  for medical reasons / religious services and are infrequent or of short duration when for other reasons). If current dressing causes regression in wound condition, may D/C ordered dressing product/s and apply Normal Saline Moist Dressing daily until next [place] / Other MD appointment. [place] of regression in wound condition at [phone]. Please direct any NON-WOUND related issues/requests for orders to patient's Primary Care Physician Negative Pressure Wound Therapy: Wound #1 Right Gluteal fold: Wound VAC settings at 125/130 mmHg continuous pressure.  Use BLACK/GREEN foam to wound cavity. Use WHITE foam to fill any tunnel/s and/or undermining. Change VAC dressing 3 X WEEK. Change canister as indicated when full. Nurse may titrate settings and frequency of dressing changes as clinically indicated. Home Health Nurse may d/c VAC for s/s of increased infection, significant wound regression, or uncontrolled drainage. [place] at [phone]. Apply contact layer over base of wound. - apply prisma/silver collagen to the base of the wound Number of foam/gauze pieces used in the dressing = Hyperbaric Oxygen Therapy: Wound #1 Right Gluteal fold: Evaluate for HBO Therapy Indication: - Right ischial osteomyelitis If appropriate for treatment, begin HBOT per protocol: 2.0 ATA for [address] Minutes without [address] DEKLYN, [place]. ([id number]) One treatment per day (delivered Monday through Friday unless otherwise specified in Special Instructions below): Total # of Treatments: - 60 1 my suggestion at this time based on what I am seeing is that we continue with hyperbarics as previously recommended I think that is beneficial for him. He is also still taking the Levaquin which I think he has good for him as well. 2. I would recommend as well that we continue with appropriate offloading I still think that is of utmost importance and we have discussed this multiple times throughout his treatment good news as I think he pays attention and I think he has been doing a great job in this regard. 3. With regard to his overall improvement I am definitely pleased with how things seem to be progressing my hope is he will continue to make good improvement over the next several weeks as well. His wound measurements were all measuring better today. We will see patient back for reevaluation in 1 week here in the clinic. If anything worsens or changes patient will contact our office for additional recommendations. Electronic Signature(s) Signed:  07/04/2019 5:14:12 PM By: Worthy Keeler [name] Entered By: Worthy Keeler on 07/04/2019 17:14:11 MARVELL, TOLLEFSON ([id number]) -------------------------------------------------------------------------------- ROS/PFSH Details Patient Name: [name] Date of Service: 07/04/2019 10:15 AM Medical Record Number: [id number] Patient Account Number: [id number] Date of Birth/Sex: [birth year] (68 y.o. M) Treating RN: Harold Barban Primary Care Provider: [name] Other Clinician: [name] Referring Provider: [name] Treating Provider/Extender: [name], Evely Gainey Weeks in Treatment: 59 Information Obtained From Patient Constitutional Symptoms (General Health) Complaints and Symptoms: Negative for: Fatigue; Fever; Chills; Marked Weight Change Respiratory Complaints and Symptoms: Negative for: Chronic or frequent coughs; Shortness of Breath Medical History: Negative for: Aspiration; Asthma; Chronic Obstructive Pulmonary Disease (COPD); Pneumothorax; Sleep Apnea; Tuberculosis Cardiovascular Complaints and Symptoms: Negative for: Chest pain; LE edema Medical History: Positive for: Hypertension - takes medication Negative for: Angina; Arrhythmia; Congestive Heart Failure; Coronary Artery Disease; Deep Vein Thrombosis; Hypotension; Myocardial Infarction; Peripheral Arterial Disease; Peripheral Venous Disease; Phlebitis; Vasculitis Psychiatric Complaints and Symptoms: Negative for: Anxiety; Claustrophobia Medical History: Negative for: Anorexia/bulimia; Confinement Anxiety Eyes Medical History: Positive for: Cataracts - both removed Negative for: Glaucoma; Optic Neuritis Ear/Nose/Mouth/Throat Medical History:  Negative for: Chronic sinus problems/congestion; Middle ear problems Hematologic/Lymphatic Medical History: Negative for: Anemia; Hemophilia; Human Immunodeficiency Virus; Lymphedema ELAZAR, BETHKE. ([id number]) Gastrointestinal Medical  History: Negative for: Cirrhosis ; Colitis; Crohnos; Hepatitis A; Hepatitis B; Hepatitis C Endocrine Medical History: Negative for: Type I Diabetes; Type II Diabetes Genitourinary Medical History: Negative for: End Stage Renal Disease Immunological Medical History: Negative for: Lupus Erythematosus; Raynaudos; Scleroderma Integumentary (Skin) Medical History: Negative for: History of Burn; History of pressure wounds Musculoskeletal Medical History: Negative for: Gout; Rheumatoid Arthritis; Osteoarthritis; Osteomyelitis Neurologic Medical History: Positive for: Paraplegia - waist down Negative for: Dementia; Neuropathy; Quadriplegia; Seizure Disorder Oncologic Medical History: Negative for: Received Chemotherapy; Received Radiation Past Medical History Notes: Prostate cancer- currently treated with horomone therapy HBO Extended History Items Eyes: Cataracts Immunizations Pneumococcal Vaccine: Received Pneumococcal Vaccination: No Implantable Devices No devices added Family and Social History Cancer: No; Diabetes: No; Heart Disease: No; Hypertension: Yes - Father; Kidney Disease: No; Lung Disease: No; Seizures: No; Stroke: Yes - Mother; Thyroid Problems: No; Tuberculosis: No; Never smoker; Marital Status - Married; Alcohol Use: Never; Drug Use: No History; Caffeine Use: Daily ROCKLAN, GABER ([id number]) Physician Affirmation I have reviewed and agree with the above information. Electronic Signature(s) Signed: 07/04/2019 5:30:09 PM By: Worthy Keeler [name] Signed: 07/05/2019 4:16:53 PM By: Harold Barban Entered By: Worthy Keeler on 07/04/2019 17:13:17 TERRIL, JUNKINS ([id number]) -------------------------------------------------------------------------------- SuperBill Details Patient Name: [name] Date of Service: 07/04/2019 Medical Record Number: [id number] Patient Account Number: [id number] Date of Birth/Sex: [birth year] (68 y.o. M) Treating  RN: Harold Barban Primary Care Provider: [name] Other Clinician: [name] Referring Provider: [name] Treating Provider/Extender: [name], Cailin Gebel Weeks in Treatment: 61 Diagnosis Coding ICD-10 Codes Code Description L89.314 Pressure ulcer of right buttock, stage 4 M86.68 Other chronic osteomyelitis, other site L03.317 Cellulitis of buttock G82.20 Paraplegia, unspecified S34.109S Unspecified injury to unspecified level of lumbar spinal cord, sequela I10 Essential (primary) hypertension Facility Procedures CPT4 Code: [id number] Description: [id number] - DEB SUBQ TISSUE 20 SQ CM/< ICD-10 Diagnosis Description L89.314 Pressure ulcer of right buttock, stage 4 Modifier: Quantity: 1 Physician Procedures CPT4 Code: [id number] Description: [id number] - WC PHYS SUBQ TISS 20 SQ CM ICD-10 Diagnosis Description L89.314 Pressure ulcer of right buttock, stage 4 Modifier: Quantity: 1 Electronic Signature(s) Signed: 07/04/2019 5:14:21 PM By: Worthy Keeler [name] Entered By: Worthy Keeler on 07/04/2019 17:14:21

## 2019-07-04 NOTE — Progress Notes (Addendum)
[name], [name] ([id number]) Visit Report for 07/04/2019 Arrival Information Details Patient Name: [name], [name] Date of Service: 07/04/2019 8:00 AM Medical Record Number: [id number] Patient Account Number: [id number] Date of Birth/Sex: [birth year] (68 y.o. M) Treating RN: Montey Hora Primary Care Najah Liverman: [name] Other Clinician: [name] Referring Laurelyn Terrero: [name] Treating Vir Whetstine/Extender: [name], HOYT Weeks in Treatment: 33 Visit Information History Since Last Visit Added or deleted any medications: No Patient Arrived: Wheel Chair Any new allergies or adverse reactions: No Arrival Time: 08:14 Had a fall or experienced change in No Accompanied By: spouse activities of daily living that may affect Transfer Assistance: Harrel Lemon Lift risk of falls: Patient Identification Verified: Yes Signs or symptoms of abuse/neglect since last visito No Secondary Verification Process Completed: Yes Hospitalized since last visit: No Patient Requires Transmission-Based No Implantable device outside of the clinic excluding No Precautions: cellular tissue based products placed in the center Patient Has Alerts: Yes since last visit: Patient Alerts: NOT Has Dressing in Place as Prescribed: Yes Diabetic Pain Present Now: No Electronic Signature(s) Signed: 07/04/2019 9:15:33 AM By: Montey Hora Entered By: Montey Hora on 07/04/2019 09:15:33 [name] ([id number]) -------------------------------------------------------------------------------- Encounter Discharge Information Details Patient Name: [name] Date of Service: 07/04/2019 8:00 AM Medical Record Number: [id number] Patient Account Number: [id number] Date of Birth/Sex: Jan 06, 1951 (68 y.o. M) Treating RN: Montey Hora Primary Care Dennisse Swader: [name] Other Clinician: [name] Referring Shifra Swartzentruber: [name] Treating Johnathon Mittal/Extender: [name],  HOYT Weeks in Treatment: 24 Encounter Discharge Information Items Discharge Condition: Stable Ambulatory Status: Wheelchair Discharge Destination: Home Transportation: Private Auto Accompanied By: spouse Schedule Follow-up Appointment: Yes Clinical Summary of Care: Notes Patient is scheduled for HBOT tomorrow 07/05/2019 Electronic Signature(s) Signed: 07/04/2019 11:01:09 AM By: Montey Hora Entered By: Montey Hora on 07/04/2019 11:01:08 [name] ([id number]) -------------------------------------------------------------------------------- Vitals Details Patient Name: [name] Date of Service: 07/04/2019 8:00 AM Medical Record Number: [id number] Patient Account Number: [id number] Date of Birth/Sex: 1951-02-02 (68 y.o. M) Treating RN: Montey Hora Primary Care Nisha Dhami: [name] Other Clinician: [name] Referring Celese Banner: [name] Treating Ieesha Abbasi/Extender: [name], HOYT Weeks in Treatment: 42 Vital Signs Time Taken: 08:31 Temperature (F): 98.9 Height (in): 73 Pulse (bpm): 84 Weight (lbs): 210 Respiratory Rate (breaths/min): 16 Body Mass Index (BMI): 27.7 Blood Pressure (mmHg): 142/80 Reference Range: 80 - 120 mg / dl Electronic Signature(s) Signed: 07/04/2019 9:15:53 AM By: Montey Hora Entered By: Montey Hora on 07/04/2019 09:15:53

## 2019-07-05 ENCOUNTER — Encounter: Payer: Medicare Other | Admitting: Physician Assistant

## 2019-07-05 DIAGNOSIS — L89314 Pressure ulcer of right buttock, stage 4: Secondary | ICD-10-CM | POA: Diagnosis not present

## 2019-07-05 NOTE — Progress Notes (Addendum)
[name], [name] ([id number]) Visit Report for 07/05/2019 Arrival Information Details Patient Name: [name], [name] Date of Service: 07/05/2019 8:00 AM Medical Record Number: [id number] Patient Account Number: [id number] Date of Birth/Sex: Oct 04, 1950 (68 y.o. M) Treating RN: [name] Primary Care Gabriella Woodhead: [name] Other Clinician: [name] Referring Justan Gaede: [name] Treating Shakia Sebastiano/Extender: [name], HOYT Weeks in Treatment: 48 Visit Information History Since Last Visit Added or deleted any medications: No Patient Arrived: Ambulatory Any new allergies or adverse reactions: No Arrival Time: 08:24 Had a fall or experienced change in No Accompanied By: self activities of daily living that may affect Transfer Assistance: Harrel Lemon Lift risk of falls: Patient Identification Verified: Yes Signs or symptoms of abuse/neglect since last visito No Secondary Verification Process Completed: Yes Hospitalized since last visit: No Patient Requires Transmission-Based No Implantable device outside of the clinic excluding No Precautions: cellular tissue based products placed in the center Patient Has Alerts: Yes since last visit: Patient Alerts: NOT Has Dressing in Place as Prescribed: Yes Diabetic Pain Present Now: Yes Electronic Signature(s) Signed: 07/05/2019 8:24:52 AM By: Gretta Cool, BSN, RN, CWS, Kim RN, BSN Entered By: Gretta Cool, BSN, RN, CWS, Kim on 07/05/2019 08:24:52 [name], [name] ([id number]) -------------------------------------------------------------------------------- Encounter Discharge Information Details Patient Name: [name] Date of Service: 07/05/2019 8:00 AM Medical Record Number: [id number] Patient Account Number: [id number] Date of Birth/Sex: [birth year] (68 y.o. M) Treating RN: [name] Primary Care Virgil Lightner: [name] Other Clinician: [name] Referring Jovie Swanner: [name] Treating  Areatha Kalata/Extender: [name], HOYT Weeks in Treatment: 101 Encounter Discharge Information Items Discharge Condition: Stable Ambulatory Status: Wheelchair Discharge Destination: Home Transportation: Private Auto Accompanied By: Wife Schedule Follow-up Appointment: Yes Clinical Summary of Care: Electronic Signature(s) Signed: 07/07/2019 11:42:17 AM By: Gretta Cool, BSN, RN, CWS, Kim RN, BSN Entered By: Gretta Cool, BSN, RN, CWS, Kim on 07/05/2019 10:22:44 [name], [name] ([id number]) -------------------------------------------------------------------------------- Vitals Details Patient Name: [name] Date of Service: 07/05/2019 8:00 AM Medical Record Number: [id number] Patient Account Number: [id number] Date of Birth/Sex: November 05, 1950 (68 y.o. M) Treating RN: [name] Primary Care Vilda Zollner: [name] Other Clinician: [name] Referring Blade Scheff: [name] Treating Mozella Rexrode/Extender: [name], HOYT Weeks in Treatment: 4 Vital Signs Time Taken: 08:15 Temperature (F): 98.6 Height (in): 73 Pulse (bpm): 66 Weight (lbs): 210 Respiratory Rate (breaths/min): 16 Body Mass Index (BMI): 27.7 Blood Pressure (mmHg): 130/76 Reference Range: 80 - 120 mg / dl Electronic Signature(s) Signed: 07/05/2019 8:28:15 AM By: Gretta Cool, BSN, RN, CWS, Kim RN, BSN Entered By: Gretta Cool, BSN, RN, CWS, Kim on 07/05/2019 [id number]

## 2019-07-05 NOTE — Progress Notes (Addendum)
[name], [name] ([id number]) Visit Report for 07/05/2019 HBO Details Patient Name: [name], [name] Date of Service: 07/05/2019 8:00 AM Medical Record Number: [id number] Patient Account Number: [id number] Date of Birth/Sex: 01/20/1951 (68 y.o. 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 HBO Treatment Details Treatment Number: 49 Patient Type: Outpatient Chamber Type: Monoplace Chamber Serial #: [id number] Treatment Protocol: 2.0 ATA with 90 minutes oxygen, and no air breaks Treatment Details Compression Rate Down: 1.5 psi / minute De-Compression Rate Up: 1.5 psi / minute Compress Tx Pressure Air breaks and breathing periods Decompress Decompress Begins Reached (leave unused spaces blank) Begins Ends Chamber Pressure (ATA) 1 2 - - - - - - 2 1 Clock Time (24 hr) 08:21 08:34 - - - - - - 10:07 10:16 Treatment Length: 115 (minutes) Treatment Segments: 4 Vital Signs Capillary Blood Glucose Reference Range: 80 - 120 mg / dl HBO Diabetic Blood Glucose Intervention Range: <131 mg/dl or >249 mg/dl Time Vitals Blood Respiratory Capillary Blood Glucose Pulse Action Type: Pulse: Temperature: Taken: Pressure: Rate: Glucose (mg/dl): Meter #: Oximetry (%) Taken: Pre 08:15 130/76 66 16 98.2 Post 10:20 138/78 68 16 97.9 Treatment Response Treatment Toleration: Well Treatment Completion Treatment Completed without Adverse Event Status: HBO Attestation I certify that I supervised this HBO treatment in accordance with Medicare guidelines. A trained emergency response Yes team is readily  available per hospital policies and procedures. Continue HBOT as ordered. Yes Electronic Signature(s) [name], [name] ([id number]) Signed: 07/05/2019 5:16:44 PM By: Worthy Keeler [name] Previous Signature: 07/05/2019 10:08:34 AM Version By: Gretta Cool BSN, RN, CWS, Kim RN, BSN Previous Signature: 07/05/2019 8:27:11 AM Version By: Gretta Cool, BSN, RN, CWS, Kim RN, BSN Entered By: Worthy Keeler on 07/05/2019 17:16:44 [name], [name] ([id number]) -------------------------------------------------------------------------------- HBO Safety Checklist Details Patient Name: [name] Date of Service: 07/05/2019 8:00 AM Medical Record Number: [id number] Patient Account Number: [id number] Date of Birth/Sex: Nov 08, 1950 (68 y.o. M) Treating RN: [name] Primary Care Jaamal Farooqui: [name] Other Clinician: [name] Referring Peightyn Roberson: [name] Treating Lyal Husted/Extender: [name], HOYT Weeks in Treatment: 41 HBO Safety Checklist Items Safety Checklist Consent Form Signed NA Patient voided / foley secured and emptied When did you last eato am Last dose of injectable or oral agent NA Ostomy pouch emptied and vented if applicable NA All implantable devices assessed, documented and approved NA Intravenous access site secured and place NA Valuables secured Linens and cotton and cotton/polyester blend (less than 51% polyester) Personal oil-based products / skin lotions / body lotions removed Wigs or hairpieces removed NA Smoking or tobacco materials removed NA Books / newspapers / magazines / loose paper removed Cologne, aftershave, perfume and deodorant removed Jewelry removed (may wrap wedding band) Make-up removed NA Hair care products removed Battery operated devices (external) removed NA Heating patches and chemical warmers removed NA Titanium eyewear removed Nail polish cured greater than 10 hours NA Casting material cured greater than 10  hours NA Hearing aids removed NA Loose dentures or partials removed NA Prosthetics have been removed NA Patient demonstrates correct use of air break device (if applicable) Patient concerns have been addressed Patient grounding bracelet on and cord attached to chamber Specifics for Inpatients (complete in addition  to above) Medication sheet sent with patient Intravenous medications needed or due during therapy sent with patient Drainage tubes (e.g. nasogastric tube or chest tube secured and vented) Endotracheal or Tracheotomy tube secured Cuff deflated of air and inflated with saline Airway suctioned Electronic Signature(s) Signed: 07/05/2019 8:26:34 AM By: Gretta Cool, BSN, RN, CWS, Kim RN, BSN [address], [place] ([id number]) Entered By: Gretta Cool, BSN, RN, CWS, Kim on 07/05/2019 [id number]

## 2019-07-05 NOTE — Progress Notes (Signed)
[name], [name] ([id number]) Visit Report for 07/04/2019 Problem List Details Patient Name: [name], [name] Date of Service: 07/04/2019 8:00 AM Medical Record Number: [id number] Patient Account Number: [id number] Date of Birth/Sex: [birth year] (68 y.o. M) Treating RN: Harold Barban Primary Care Provider: [name] Other Clinician: [name] Referring Provider: [name] Treating Provider/Extender: [name], Darreon Lutes Weeks in Treatment: 58 Active Problems ICD-10 Evaluated Encounter Code Description Active Date Today Diagnosis L89.314 Pressure ulcer of right buttock, stage 4 11/30/2017 No Yes M86.68 Other chronic osteomyelitis, other site 04/08/2019 No Yes L03.317 Cellulitis of buttock 06/21/2018 No Yes G82.20 Paraplegia, unspecified 11/30/2017 No Yes S34.109S Unspecified injury to unspecified level of lumbar spinal cord, 11/30/2017 No Yes sequela I10 Essential (primary) hypertension 11/30/2017 No Yes Inactive Problems Resolved Problems Electronic Signature(s) Signed: 07/04/2019 5:29:49 PM By: Worthy Keeler [name] Entered By: Worthy Keeler on 07/04/2019 17:29:48 [name], [name] ([id number]) -------------------------------------------------------------------------------- SuperBill Details Patient Name: [name] Date of Service: 07/04/2019 Medical Record Number: [id number] Patient Account Number: [id number] Date of Birth/Sex: [birth year] (68 y.o. M) Treating RN: Montey Hora Primary Care Provider: [name] Other Clinician: [name] Referring Provider: [name] Treating Provider/Extender: [name], Zadok Holaway Weeks in Treatment: 44 Diagnosis Coding ICD-10 Codes Code Description L89.314 Pressure ulcer of right buttock, stage 4 M86.68 Other chronic osteomyelitis, other site L03.317 Cellulitis of buttock G82.20 Paraplegia, unspecified S34.109S Unspecified injury to unspecified level of lumbar spinal cord, sequela I10  Essential (primary) hypertension Facility Procedures CPT4 Code: [id number] Description: (Facility Use Only) HBOT, full body chamber, 87min Modifier: Quantity: 4 Physician Procedures CPT4 Code: [id number] Description: [id number] - WC PHYS HYPERBARIC OXYGEN THERAPY ICD-10 Diagnosis Description L89.314 Pressure ulcer of right buttock, stage 4 M86.68 Other chronic osteomyelitis, other site Modifier: Quantity: 1 Electronic Signature(s) Signed: 07/04/2019 5:29:45 PM By: Worthy Keeler [name] Previous Signature: 07/04/2019 11:00:32 AM Version By: Montey Hora Entered By: Worthy Keeler on 07/04/2019 17:29:45

## 2019-07-05 NOTE — Progress Notes (Signed)
BRACEY, HENINGER ([id number]) Visit Report for 07/05/2019 Problem List Details Patient Name: [name], [name] Date of Service: 07/05/2019 8:00 AM Medical Record Number: [id number] Patient Account Number: [id number] Date of Birth/Sex: [birth year] (68 y.o. M) Treating RN: Montey Hora Primary Care Provider: [name] Other Clinician: [name] Referring Provider: [name] Treating Provider/Extender: [name], Darianne Muralles Weeks in Treatment: 48 Active Problems ICD-10 Evaluated Encounter Code Description Active Date Today Diagnosis L89.314 Pressure ulcer of right buttock, stage 4 11/30/2017 No Yes M86.68 Other chronic osteomyelitis, other site 04/08/2019 No Yes L03.317 Cellulitis of buttock 06/21/2018 No Yes G82.20 Paraplegia, unspecified 11/30/2017 No Yes S34.109S Unspecified injury to unspecified level of lumbar spinal cord, 11/30/2017 No Yes sequela I10 Essential (primary) hypertension 11/30/2017 No Yes Inactive Problems Resolved Problems Electronic Signature(s) Signed: 07/05/2019 5:16:53 PM By: Worthy Keeler [name] Entered By: Worthy Keeler on 07/05/2019 17:16:53 Guedea, [name] -------------------------------------------------------------------------------- SuperBill Details Patient Name: [name] Date of Service: 07/05/2019 Medical Record Number: [id number] Patient Account Number: [id number] Date of Birth/Sex: June 24, 1951 (68 y.o. M) Treating RN: [name] Primary Care Provider: [name] Other Clinician: [name] Referring Provider: [name] Treating Provider/Extender: [name], Hazel Wrinkle Weeks in Treatment: 33 Diagnosis Coding ICD-10 Codes Code Description L89.314 Pressure ulcer of right buttock, stage 4 M86.68 Other chronic osteomyelitis, other site L03.317 Cellulitis of buttock G82.20 Paraplegia, unspecified S34.109S Unspecified injury to unspecified level of lumbar spinal cord, sequela I10  Essential (primary) hypertension Facility Procedures CPT4 Code: [id number] Description: (Facility Use Only) HBOT, full body chamber, 69min Modifier: Quantity: 4 Physician Procedures CPT4 Code: [id number] Description: [id number] - WC PHYS HYPERBARIC OXYGEN THERAPY ICD-10 Diagnosis Description M86.68 Other chronic osteomyelitis, other site Modifier: Quantity: 1 Electronic Signature(s) Signed: 07/05/2019 5:16:49 PM By: Worthy Keeler [name] Entered By: Worthy Keeler on 07/05/2019 17:16:49

## 2019-07-06 ENCOUNTER — Other Ambulatory Visit: Payer: Self-pay

## 2019-07-06 ENCOUNTER — Encounter: Payer: Medicare Other | Admitting: Internal Medicine

## 2019-07-06 DIAGNOSIS — L89314 Pressure ulcer of right buttock, stage 4: Secondary | ICD-10-CM | POA: Diagnosis not present

## 2019-07-06 NOTE — Progress Notes (Signed)
MARKEVIOUS, [name] ([id number]) Visit Report for 07/06/2019 Arrival Information Details Patient Name: [name], [name] Date of Service: 07/06/2019 8:00 AM Medical Record Number: [id number] Patient Account Number: [id number] Date of Birth/Sex: April 11, 1951 (68 y.o. M) Treating RN: [name] Primary Care Gennett Garcia: [name] Other Clinician: [name] Referring Shandi Godfrey: [name] Treating Kathleen Likins/Extender: [name] in Treatment: 3 Visit Information History Since Last Visit Added or deleted any medications: No Patient Arrived: Wheel Chair Any new allergies or adverse reactions: No Arrival Time: 07:55 Had a fall or experienced change in No Accompanied By: wife activities of daily living that may affect Transfer Assistance: Harrel Lemon Lift risk of falls: Patient Identification Verified: Yes Signs or symptoms of abuse/neglect since last visito No Secondary Verification Process Completed: Yes Hospitalized since last visit: No Patient Requires Transmission-Based No Implantable device outside of the clinic excluding No Precautions: cellular tissue based products placed in the center Patient Has Alerts: Yes since last visit: Patient Alerts: NOT Has Dressing in Place as Prescribed: Yes Diabetic Pain Present Now: No Electronic Signature(s) Signed: 07/06/2019 10:36:24 AM By: Lorine Bears RCP, RRT, CHT Entered By: Lorine Bears on 07/06/2019 08:28:43 [name], [name] ([id number]) -------------------------------------------------------------------------------- Encounter Discharge Information Details Patient Name: [name] Date of Service: 07/06/2019 8:00 AM Medical Record Number: [id number] Patient Account Number: [id number] Date of Birth/Sex: May 18, 1951 (68 y.o. M) Treating RN: [name] Primary Care Radley Barto: [name] Other Clinician: [name] Referring Issabelle Mcraney: [name] Treating Hector Taft/Extender: [name] in Treatment: 31 Encounter Discharge Information Items Discharge Condition: Stable Ambulatory Status: Wheelchair Discharge Destination: Home Transportation: Private Auto Accompanied By: wife Schedule Follow-up Appointment: Yes Clinical Summary of Care: Notes Patient has an HBO treatment scheduled on 07/07/2019 at 08:00 am. Electronic Signature(s) Signed: 07/06/2019 10:36:24 AM By: Lorine Bears RCP, RRT, CHT Entered By: Lorine Bears on 07/06/2019 10:35:57 [name], [name] ([id number]) -------------------------------------------------------------------------------- Vitals Details Patient Name: [name] Date of Service: 07/06/2019 8:00 AM Medical Record Number: [id number] Patient Account Number: [id number] Date of Birth/Sex: 12-05-1950 (68 y.o. M) Treating RN: [name] Primary Care Laneah Luft: [name] Other Clinician: [name] Referring Trana Ressler: [name] Treating Quay Simkin/Extender: [name] in Treatment: 75 Vital Signs Time Taken: 08:12 Temperature (F): 98.7 Height (in): 73 Pulse (bpm): 72 Weight (lbs): 210 Respiratory Rate (breaths/min): 16 Body Mass Index (BMI): 27.7 Blood Pressure (mmHg): 110/62 Reference Range: 80 - 120 mg / dl Electronic Signature(s) Signed: 07/06/2019 10:36:24 AM By: Lorine Bears RCP, RRT, CHT Entered By: Lorine Bears on 07/06/2019 08:29:06

## 2019-07-06 NOTE — Progress Notes (Signed)
CAYD, REVEL ([id number]) Visit Report for 07/06/2019 HBO Details Patient Name: [name], [name] Date of Service: 07/06/2019 8:00 AM Medical Record Number: [id number] Patient Account Number: [id number] Date of Birth/Sex: 1950/10/27 (68 y.o. 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 HBO Treatment Details Treatment Number: 50 Patient Type: Outpatient Chamber Type: Monoplace Chamber Serial #: [id number] Treatment Protocol: 2.0 ATA with 90 minutes oxygen, and no air breaks Treatment Details Compression Rate Down: 1.5 psi / minute De-Compression Rate Up: 1.5 psi / minute Compress Tx Pressure Air breaks and breathing periods Decompress Decompress Begins Reached (leave unused spaces blank) Begins Ends Chamber Pressure (ATA) 1 2 - - - - - - 2 1 Clock Time (24 hr) 08:15 08:27 - - - - - - 09:57 10:08 Treatment Length: 113 (minutes) Treatment Segments: 4 Vital Signs Capillary Blood Glucose Reference Range: 80 - 120 mg / dl HBO Diabetic Blood Glucose Intervention Range: <131 mg/dl or >249 mg/dl Time Vitals Blood Respiratory Capillary Blood Glucose Pulse Action Type: Pulse: Temperature: Taken: Pressure: Rate: Glucose (mg/dl): Meter #: Oximetry (%) Taken: Pre 08:12 110/62 72 16 98.7 Post 10:13 112/70 60 16 98.3 Treatment Response Treatment Toleration: Well Treatment Completion Treatment Completed without Adverse Event Status: Cortana Vanderford Notes No concerns with treatment given HBO Attestation I certify that I supervised this HBO treatment in accordance with Medicare guidelines. A  trained emergency response Yes team is readily available per hospital policies and procedures. Continue HBOT as ordered. [address] SHAYLAN, VOSE ([id number]) Electronic Signature(s) Signed: 07/06/2019 4:44:44 PM By: Linton Ham MD Previous Signature: 07/06/2019 10:36:24 AM Version By: Lorine Bears RCP, RRT, CHT Entered By: Linton Ham on 07/06/2019 15:51:40 Pedregon, [name] -------------------------------------------------------------------------------- HBO Safety Checklist Details Patient Name: [name] Date of Service: 07/06/2019 8:00 AM Medical Record Number: [id number] Patient Account Number: [id number] Date of Birth/Sex: 02-Nov-1950 (68 y.o. M) Treating RN: [name] Primary Care Yenifer Saccente: [name] Other Clinician: [name] Referring Elier Zellars: [name] Treating Avik Leoni/Extender: [name] in Treatment: 52 HBO Safety Checklist Items Safety Checklist Consent Form Signed Patient voided / foley secured and emptied When did you last eato morning Last dose of injectable or oral agent n/a Ostomy pouch emptied and vented if applicable NA Medtronic Baclofen Pump; Nevro All implantable devices assessed, documented and approved Spinal Stimulator Intravenous access site secured and place NA Valuables secured Linens and cotton and cotton/polyester blend (less than 51% polyester) Personal oil-based products / skin lotions / body lotions removed Wigs or hairpieces removed NA Smoking or tobacco materials removed NA Books / newspapers / magazines / loose paper removed Cologne, aftershave, perfume and deodorant removed Jewelry removed (may wrap wedding band) Make-up removed NA Hair care products removed Battery operated devices (external) removed NPWT pump Heating patches and chemical warmers removed Titanium eyewear removed NA Nail polish cured greater than 10 hours NA Casting material  cured greater than 10 hours NA Hearing aids removed NA Loose dentures or partials removed NA Prosthetics have been removed NA Patient demonstrates correct use of air break device (if applicable) Patient concerns have been addressed Patient grounding bracelet on and cord attached to chamber Specifics for Inpatients (complete in addition to above) Medication sheet sent  with patient Intravenous medications needed or due during therapy sent with patient Drainage tubes (e.g. nasogastric tube or chest tube secured and vented) Endotracheal or Tracheotomy tube secured Cuff deflated of air and inflated with saline Airway suctioned Electronic Signature(s) LARKIN, KOTILA ([id number]) Signed: 07/06/2019 10:36:24 AM By: Lorine Bears RCP, RRT, CHT Entered By: Lorine Bears on 07/06/2019 08:30:13

## 2019-07-07 ENCOUNTER — Encounter: Payer: Medicare Other | Admitting: Physician Assistant

## 2019-07-07 DIAGNOSIS — L89314 Pressure ulcer of right buttock, stage 4: Secondary | ICD-10-CM | POA: Diagnosis not present

## 2019-07-07 NOTE — Progress Notes (Signed)
[name], [name] ([id number]) Visit Report for 07/07/2019 Problem List Details Patient Name: [name], [name] Date of Service: 07/07/2019 8:00 AM Medical Record Number: [id number] Patient Account Number: [id number] Date of Birth/Sex: 08-13-1950 (68 y.o. 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, sequela I10  Essential (primary) hypertension Facility Procedures CPT4 Code: [id number] Description: (Facility Use Only) HBOT, full body chamber, 76min Modifier: Quantity: 4 Physician Procedures CPT4 Code: [id number] Description: [id number] - WC PHYS HYPERBARIC OXYGEN THERAPY ICD-10 Diagnosis Description L89.314 Pressure ulcer of right buttock, stage 4 M86.68 Other chronic osteomyelitis, other site Modifier: Quantity: 1 Electronic Signature(s) Signed: 07/07/2019 5:24:49 PM By: Worthy Keeler [name] Previous Signature: 07/07/2019 10:20:05 AM Version By: Montey Hora Entered By: Worthy Keeler on 07/07/2019 17:24:49

## 2019-07-07 NOTE — Progress Notes (Addendum)
[name] ([id number]) Visit Report for 07/07/2019 Arrival Information Details Patient Name: [name], [name] Date of Service: 07/07/2019 8:00 AM Medical Record Number: [id number] Patient Account Number: [id number] Date of Birth/Sex: 19-May-1951 (68 y.o. M) Treating RN: Montey Hora Primary Care Jaela Yepez: [name] Other Clinician: [name] Referring Krystine Pabst: [name] Treating Cherl Gorney/Extender: [name], HOYT Weeks in Treatment: 60 Visit Information History Since Last Visit Added or deleted any medications: No Patient Arrived: Wheel Chair Any new allergies or adverse reactions: No Arrival Time: 08:02 Had a fall or experienced change in No Accompanied By: spouse activities of daily living that may affect Transfer Assistance: Harrel Lemon Lift risk of falls: Patient Identification Verified: Yes Signs or symptoms of abuse/neglect since last visito No Secondary Verification Process Completed: Yes Hospitalized since last visit: No Patient Requires Transmission-Based No Implantable device outside of the clinic excluding No Precautions: cellular tissue based products placed in the center Patient Has Alerts: Yes since last visit: Patient Alerts: NOT Has Dressing in Place as Prescribed: Yes Diabetic Pain Present Now: No Electronic Signature(s) Signed: 07/07/2019 8:25:37 AM By: Montey Hora Entered By: Montey Hora on 07/07/2019 08:25:37 [name] ([id number]) -------------------------------------------------------------------------------- Encounter Discharge Information Details Patient Name: [name] Date of Service: 07/07/2019 8:00 AM Medical Record Number: [id number] Patient Account Number: [id number] Date of Birth/Sex: 12/24/1950 (68 y.o. M) Treating RN: Montey Hora Primary Care Neely Kammerer: [name] Other Clinician: [name] Referring Felton Buczynski: [name] Treating Jalaysha Skilton/Extender: [name],  HOYT Weeks in Treatment: 52 Encounter Discharge Information Items Discharge Condition: Stable Ambulatory Status: Wheelchair Discharge Destination: Home Transportation: Private Auto Accompanied By: spouse Schedule Follow-up Appointment: Yes Clinical Summary of Care: Notes Patient is scheduled for HBO tomorrow 07/08/2019 Electronic Signature(s) Signed: 07/07/2019 10:20:46 AM By: Montey Hora Entered By: Montey Hora on 07/07/2019 10:20:45 [name] ([id number]) -------------------------------------------------------------------------------- Vitals Details Patient Name: [name] Date of Service: 07/07/2019 8:00 AM Medical Record Number: [id number] Patient Account Number: [id number] Date of Birth/Sex: May 01, 1951 (68 y.o. M) Treating RN: Montey Hora Primary Care Camerin Jimenez: [name] Other Clinician: [name] Referring Azaliyah Kennard: [name] Treating Katriel Cutsforth/Extender: [name], HOYT Weeks in Treatment: 49 Vital Signs Time Taken: 08:13 Temperature (F): 98.6 Height (in): 73 Pulse (bpm): 72 Weight (lbs): 210 Respiratory Rate (breaths/min): 16 Body Mass Index (BMI): 27.7 Blood Pressure (mmHg): 106/62 Reference Range: 80 - 120 mg / dl Electronic Signature(s) Signed: 07/07/2019 8:26:18 AM By: Montey Hora Entered By: Montey Hora on 07/07/2019 08:26:18

## 2019-07-07 NOTE — Progress Notes (Addendum)
Brett, [name] ([id number]) Visit Report for 07/07/2019 HBO Details Patient Name: [name], [name] Date of Service: 07/07/2019 8:00 AM Medical Record Number: [id number] Patient Account Number: [id number] Date of Birth/Sex: 06-08-1951 (68 y.o. 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 HBO Treatment Details Treatment Number: 51 Patient Type: Outpatient Chamber Type: Monoplace Chamber Serial #: [id number] Treatment Protocol: 2.0 ATA with 90 minutes oxygen, and no air breaks Treatment Details Compression Rate Down: 1.5 psi / minute De-Compression Rate Up: 1.5 psi / minute Compress Tx Pressure Air breaks and breathing periods Decompress Decompress Begins Reached (leave unused spaces blank) Begins Ends Chamber Pressure (ATA) 1 2 - - - - - - 2 1 Clock Time (24 hr) 08:20 08:32 - - - - - - 10:02 10:13 Treatment Length: 113 (minutes) Treatment Segments: 4 Vital Signs Capillary Blood Glucose Reference Range: 80 - 120 mg / dl HBO Diabetic Blood Glucose Intervention Range: <131 mg/dl or >249 mg/dl Time Vitals Blood Respiratory Capillary Blood Glucose Pulse Action Type: Pulse: Temperature: Taken: Pressure: Rate: Glucose (mg/dl): Meter #: Oximetry (%) Taken: Pre 08:13 106/62 72 16 98.6 Post 10:19 122/66 60 16 98.1 Treatment Response Treatment Toleration: Well Treatment Completion Treatment Completed without Adverse Event Status: HBO Attestation I certify that I supervised this HBO treatment in accordance with Medicare guidelines. A trained emergency response Yes team is readily  available per hospital policies and procedures. Continue HBOT as ordered. Yes Electronic Signature(s) CHAYNCE, COMUNALE ([id number]) Signed: 07/07/2019 5:24:42 PM By: Worthy Keeler [name] Previous Signature: 07/07/2019 10:19:48 AM Version By: Montey Hora Previous Signature: 07/07/2019 8:32:32 AM Version By: Montey Hora Entered By: Worthy Keeler on 07/07/2019 17:24:42 DILEN, GIANGRASSO ([id number]) -------------------------------------------------------------------------------- HBO Safety Checklist Details Patient Name: [name] Date of Service: 07/07/2019 8:00 AM Medical Record Number: [id number] Patient Account Number: [id number] Date of Birth/Sex: 1951/04/11 (68 y.o. M) Treating RN: Montey Hora Primary Care Keelee Yankey: [name] Other Clinician: [name] Referring Taelar Gronewold: [name] Treating Tomislav Micale/Extender: [name], HOYT Weeks in Treatment: 52 HBO Safety Checklist Items Safety Checklist Consent Form Signed Patient voided / foley secured and emptied When did you last eato 07/07/19 AM Last dose of injectable or oral agent n/a Ostomy pouch emptied and vented if applicable NA All implantable devices assessed, documented and approved Intravenous access site secured and place NA Valuables secured Linens and cotton and cotton/polyester blend (less than 51% polyester) Personal oil-based products / skin lotions / body lotions removed Wigs or hairpieces removed NA Smoking or tobacco materials removed NA Books / newspapers / magazines / loose paper removed Cologne, aftershave, perfume and deodorant removed Jewelry removed (may wrap wedding band) Make-up removed NA Hair care products removed NA Battery operated devices (external) removed NA Heating patches and chemical warmers removed NA Titanium eyewear removed NA Nail polish cured greater than 10 hours NA Casting material cured greater than 10 hours NA Hearing aids  removed NA Loose dentures or partials removed NA Prosthetics have been removed NA Patient demonstrates correct use of air break device (if applicable) Patient concerns have been addressed Patient grounding bracelet on and cord attached to chamber Specifics for Inpatients (complete in addition to above) Medication sheet sent with patient Intravenous medications  needed or due during therapy sent with patient Drainage tubes (e.g. nasogastric tube or chest tube secured and vented) Endotracheal or Tracheotomy tube secured Cuff deflated of air and inflated with saline Airway suctioned Electronic Signature(s) Signed: 07/07/2019 8:27:59 AM By: Oneita Kras, [name] Entered By: Montey Hora on 07/07/2019 08:27:59

## 2019-07-08 ENCOUNTER — Encounter: Payer: Medicare Other | Admitting: Physician Assistant

## 2019-07-08 ENCOUNTER — Other Ambulatory Visit: Payer: Self-pay

## 2019-07-08 DIAGNOSIS — L89314 Pressure ulcer of right buttock, stage 4: Secondary | ICD-10-CM | POA: Diagnosis not present

## 2019-07-08 NOTE — Progress Notes (Addendum)
NACARI, POINT ([id number]) Visit Report for 07/08/2019 Arrival Information Details Patient Name: [name], [name] Date of Service: 07/08/2019 8:00 AM Medical Record Number: [id number] Patient Account Number: [id number] Date of Birth/Sex: [birth year] (68 y.o. M) Treating RN: Montey Hora Primary Care Eran Windish: [name] Other Clinician: [name] Referring Aalijah Mims: [name] Treating Lasaundra Riche/Extender: [name], HOYT Weeks in Treatment: 28 Visit Information History Since Last Visit Added or deleted any medications: No Patient Arrived: Wheel Chair Any new allergies or adverse reactions: No Arrival Time: 08:12 Had a fall or experienced change in No Accompanied By: spouse activities of daily living that may affect Transfer Assistance: Harrel Lemon Lift risk of falls: Patient Identification Verified: Yes Signs or symptoms of abuse/neglect since last visito No Secondary Verification Process Completed: Yes Hospitalized since last visit: No Patient Requires Transmission-Based No Implantable device outside of the clinic excluding No Precautions: cellular tissue based products placed in the center Patient Has Alerts: Yes since last visit: Patient Alerts: NOT Has Dressing in Place as Prescribed: Yes Diabetic Pain Present Now: No Electronic Signature(s) Signed: 07/08/2019 8:16:42 AM By: Montey Hora Entered By: Montey Hora on 07/08/2019 08:16:42 [name] ([id number]) -------------------------------------------------------------------------------- Encounter Discharge Information Details Patient Name: [name] Date of Service: 07/08/2019 8:00 AM Medical Record Number: [id number] Patient Account Number: [id number] Date of Birth/Sex: Jul 23, 1950 (68 y.o. M) Treating RN: Montey Hora Primary Care Dulcy Sida: [name] Other Clinician: [name] Referring Ashni Lonzo: [name] Treating Junious Ragone/Extender: [name],  HOYT Weeks in Treatment: 23 Encounter Discharge Information Items Discharge Condition: Stable Ambulatory Status: Wheelchair Discharge Destination: Home Transportation: Private Auto Accompanied By: spouse Schedule Follow-up Appointment: Yes Clinical Summary of Care: Notes Patient is scheduled for HBO at [facility] on 07/11/2019 Electronic Signature(s) Signed: 07/08/2019 10:28:20 AM By: Montey Hora Entered By: Montey Hora on 07/08/2019 10:28:19 [name] ([id number]) -------------------------------------------------------------------------------- [name] Details Patient Name: [name] Date of Service: 07/08/2019 8:00 AM Medical Record Number: [id number] Patient Account Number: [id number] Date of Birth/Sex: Feb 07, 1951 (68 y.o. M) Treating RN: Montey Hora Primary Care Monda Chastain: [name] Other Clinician: [name] Referring Alekzander Cardell: [name] Treating Yahshua Thibault/Extender: [name], HOYT Weeks in Treatment: 31 Vital Signs Time Taken: 08:16 Temperature (F): 98.6 Height (in): 73 Pulse (bpm): 72 Weight (lbs): 210 Respiratory Rate (breaths/min): 16 Body Mass Index (BMI): 27.7 Blood Pressure (mmHg): 112/68 Reference Range: 80 - 120 mg / dl Electronic Signature(s) Signed: 07/08/2019 8:17:54 AM By: Montey Hora Entered By: Montey Hora on 07/08/2019 08:17:53

## 2019-07-08 NOTE — Progress Notes (Addendum)
Brett, [name] ([id number]) Visit Report for 07/08/2019 HBO Details Patient Name: [name], [name] Date of Service: 07/08/2019 8:00 AM Medical Record Number: [id number] Patient Account Number: [id number] Date of Birth/Sex: 1951-02-24 (68 y.o. 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 HBO Treatment Details Treatment Number: 52 Patient Type: Outpatient Chamber Type: Monoplace Chamber Serial #: [id number] Treatment Protocol: 2.0 ATA with 90 minutes oxygen, and no air breaks Treatment Details Compression Rate Down: 1.5 psi / minute De-Compression Rate Up: 1.5 psi / minute Compress Tx Pressure Air breaks and breathing periods Decompress Decompress Begins Reached (leave unused spaces blank) Begins Ends Chamber Pressure (ATA) 1 2 - - - - - - 2 1 Clock Time (24 hr) 08:20 08:32 - - - - - - 10:02 10:13 Treatment Length: 113 (minutes) Treatment Segments: 4 Vital Signs Capillary Blood Glucose Reference Range: 80 - 120 mg / dl HBO Diabetic Blood Glucose Intervention Range: <131 mg/dl or >249 mg/dl Time Vitals Blood Respiratory Capillary Blood Glucose Pulse Action Type: Pulse: Temperature: Taken: Pressure: Rate: Glucose (mg/dl): Meter #: Oximetry (%) Taken: Pre 08:16 112/68 72 16 98.6 Post 10:18 132/68 80 16 98 Treatment Response Treatment Toleration: Well Treatment Completion Treatment Completed without Adverse Event Status: HBO Attestation I certify that I supervised this HBO treatment in accordance with Medicare guidelines. A trained emergency response Yes team is readily  available per hospital policies and procedures. Continue HBOT as ordered. Yes Electronic Signature(s) CHANLER, ARTIS ([id number]) Signed: 07/08/2019 11:53:45 AM By: Worthy Keeler [name] Previous Signature: 07/08/2019 10:27:16 AM Version By: Montey Hora Previous Signature: 07/08/2019 10:58:36 AM Version By: Worthy Keeler [name] Previous Signature: 07/08/2019 8:35:09 AM Version By: Montey Hora Entered By: Worthy Keeler on 07/08/2019 11:53:44 SIRCHARLES, SANDVIK ([id number]) -------------------------------------------------------------------------------- HBO Safety Checklist Details Patient Name: [name] Date of Service: 07/08/2019 8:00 AM Medical Record Number: [id number] Patient Account Number: [id number] Date of Birth/Sex: [birth year] (68 y.o. M) Treating RN: Montey Hora Primary Care Keyosha Tiedt: [name] Other Clinician: [name] Referring Ondra Deboard: [name] Treating Teriyah Purington/Extender: [name], HOYT Weeks in Treatment: 59 HBO Safety Checklist Items Safety Checklist Consent Form Signed Patient voided / foley secured and emptied When did you last eato 07/08/19 AM Last dose of injectable or oral agent n/a Ostomy pouch emptied and vented if applicable NA All implantable devices assessed, documented and approved Intravenous access site secured and place NA Valuables secured Linens and cotton and cotton/polyester blend (less than 51% polyester) Personal oil-based products / skin lotions / body lotions removed Wigs or hairpieces removed NA Smoking or tobacco materials removed NA Books / newspapers / magazines / loose paper removed Cologne, aftershave, perfume and deodorant removed Jewelry removed (may wrap wedding band) Make-up removed NA Hair care products removed NA Battery operated devices (external) removed Heating patches and chemical warmers removed NA Titanium eyewear removed Nail polish cured greater than 10  hours NA Casting material cured greater than 10 hours NA Hearing aids removed NA Loose dentures or partials removed NA Prosthetics have been removed NA Patient demonstrates correct use of air break device (if applicable) Patient concerns have been addressed Patient grounding bracelet on and cord attached to chamber Specifics for Inpatients (complete in addition  to above) Medication sheet sent with patient Intravenous medications needed or due during therapy sent with patient Drainage tubes (e.g. nasogastric tube or chest tube secured and vented) Endotracheal or Tracheotomy tube secured Cuff deflated of air and inflated with saline Airway suctioned Electronic Signature(s) Signed: 07/08/2019 8:28:04 AM By: Oneita Kras, [name] Entered By: Montey Hora on 07/08/2019 [id number]

## 2019-07-08 NOTE — Progress Notes (Signed)
EDIS, WARRENFELTZ ([id number]) Visit Report for 07/08/2019 Problem List Details Patient Name: [name], [name] Date of Service: 07/08/2019 8:00 AM Medical Record Number: [id number] Patient Account Number: [id number] Date of Birth/Sex: 1950/08/14 (68 y.o. 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, sequela I10  Essential (primary) hypertension Facility Procedures CPT4 Code: [id number] Description: (Facility Use Only) HBOT, full body chamber, 49min Modifier: Quantity: 4 Physician Procedures CPT4 Code: [id number] Description: [id number] - WC PHYS HYPERBARIC OXYGEN THERAPY ICD-10 Diagnosis Description L89.314 Pressure ulcer of right buttock, stage 4 M86.68 Other chronic osteomyelitis, other site Modifier: Quantity: 1 Electronic Signature(s) Signed: 07/08/2019 11:53:52 AM By: Worthy Keeler [name] Previous Signature: 07/08/2019 10:27:27 AM Version By: Montey Hora Previous Signature: 07/08/2019 10:58:36 AM Version By: Worthy Keeler [name] Entered By: Worthy Keeler on 07/08/2019 11:53:51

## 2019-07-11 ENCOUNTER — Encounter: Payer: Medicare Other | Admitting: Physician Assistant

## 2019-07-11 ENCOUNTER — Other Ambulatory Visit: Payer: Self-pay

## 2019-07-11 DIAGNOSIS — L89314 Pressure ulcer of right buttock, stage 4: Secondary | ICD-10-CM | POA: Diagnosis not present

## 2019-07-11 NOTE — Progress Notes (Signed)
[name], [name] ([id number]) Visit Report for 07/11/2019 Arrival Information Details Patient Name: [name], [name] Date of Service: 07/11/2019 8:00 AM Medical Record Number: [id number] Patient Account Number: [id number] Date of Birth/Sex: 02-03-1951 (68 y.o. M) Treating RN: Harold Barban Primary Care Justn Quale: [name] Other Clinician: [name] Referring Tzvi Economou: [name] Treating Zarah Carbon/Extender: [name], HOYT Weeks in Treatment: 74 Visit Information History Since Last Visit Added or deleted any medications: No Patient Arrived: Wheel Chair Any new allergies or adverse reactions: No Arrival Time: 08:00 Had a fall or experienced change in No Accompanied By: wife activities of daily living that may affect Transfer Assistance: Harrel Lemon Lift risk of falls: Patient Identification Verified: Yes Signs or symptoms of abuse/neglect since last visito No Secondary Verification Process Completed: Yes Hospitalized since last visit: No Patient Requires Transmission-Based No Implantable device outside of the clinic excluding No Precautions: cellular tissue based products placed in the center Patient Has Alerts: Yes since last visit: Patient Alerts: NOT Has Dressing in Place as Prescribed: Yes Diabetic Pain Present Now: No Electronic Signature(s) Signed: 07/11/2019 2:03:17 PM By: Lorine Bears RCP, RRT, CHT Entered By: Lorine Bears on 07/11/2019 08:25:18 CAMARIE, REGEN ([id number]) -------------------------------------------------------------------------------- Encounter Discharge Information Details Patient Name: [name] Date of Service: 07/11/2019 8:00 AM Medical Record Number: [id number] Patient Account Number: [id number] Date of Birth/Sex: 19-Jul-1951 (68 y.o. M) Treating RN: Harold Barban Primary Care Eltha Tingley: [name] Other Clinician: [name] Referring Hazley Dezeeuw: [name] Treating Jalyric Kaestner/Extender: [name], HOYT Weeks in Treatment: 74 Encounter Discharge Information Items Discharge Condition: Stable Ambulatory Status: Wheelchair Discharge Destination: Home Transportation: Private Auto Accompanied By: wife Schedule Follow-up Appointment: Yes Clinical Summary of Care: Notes Patient has an HBO treatment scheduled on 07/12/2019 at 08:00 am. Electronic Signature(s) Signed: 07/11/2019 2:03:17 PM By: Lorine Bears RCP, RRT, CHT Entered By: Lorine Bears on 07/11/2019 10:14:48 [name], [name] ([id number]) -------------------------------------------------------------------------------- Vitals Details Patient Name: [name] Date of Service: 07/11/2019 8:00 AM Medical Record Number: [id number] Patient Account Number: [id number] Date of Birth/Sex: 03-27-1951 (68 y.o. M) Treating RN: Harold Barban Primary Care Mozell Haber: [name] Other Clinician: [name] Referring Shyah Cadmus: [name] Treating Hollyn Stucky/Extender: [name], HOYT Weeks in Treatment: 43 Vital Signs Time Taken: 08:13 Temperature (F): 98.9 Height (in): 73 Pulse (bpm): 72 Weight (lbs): 210 Respiratory Rate (breaths/min): 16 Body Mass Index (BMI): 27.7 Blood Pressure (mmHg): 112/74 Reference Range: 80 - 120 mg / dl Electronic Signature(s) Signed: 07/11/2019 2:03:17 PM By: Lorine Bears RCP, RRT, CHT Entered By: Lorine Bears on 07/11/2019 08:25:52

## 2019-07-11 NOTE — Progress Notes (Addendum)
TUCKER, KINTIGH ([id number]) Visit Report for 07/11/2019 Arrival Information Details Patient Name: [name], [name] Date of Service: 07/11/2019 10:00 AM Medical Record Number: [id number] Patient Account Number: [id number] Date of Birth/Sex: [birth year] (68 y.o. M) Treating RN: [name] Primary Care [name]: [name] Other Clinician: Referring [name]: [name] [name], [name] Weeks in Treatment: 47 Visit Information History Since Last Visit Added or deleted any medications: No Patient Arrived: Wheel Chair Any new allergies or adverse reactions: No Arrival Time: 10:15 Had a fall or experienced change in No Accompanied By: wife activities of daily living that may affect Transfer Assistance: Harrel Lemon Lift risk of falls: Patient Identification Verified: Yes Signs or symptoms of abuse/neglect since last visito No Secondary Verification Process Completed: Yes Hospitalized since last visit: No Patient Requires Transmission-Based No Implantable device outside of the clinic excluding No Precautions: cellular tissue based products placed in the center Patient Has Alerts: Yes since last visit: Patient Alerts: NOT Has Dressing in Place as Prescribed: Yes Diabetic Pain Present Now: No Electronic Signature(s) Signed: 07/11/2019 2:03:17 PM By: [name] RCP, RRT, CHT Entered By: [name] on 07/11/2019 10:15:33 [name] ([id number]) -------------------------------------------------------------------------------- Encounter Discharge Information Details Patient Name: [name] Date of Service: 07/11/2019 10:00 AM Medical Record Number: [id number] Patient Account Number: [id number] Date of Birth/Sex: 02/26/1951 (68 y.o. M) Treating RN: [name] Primary Care [name]: [name] Other Clinician: Referring [name]: [name] Treating Bryah Ocheltree/Extender:  [name], [name] Weeks in Treatment: 71 Encounter Discharge Information Items Post Procedure Vitals Discharge Condition: Stable Temperature (F): 98.0 Ambulatory Status: Wheelchair Pulse (bpm): 66 Discharge Destination: Home Respiratory Rate (breaths/min): 16 Transportation: Private Auto Blood Pressure (mmHg): 106/64 Accompanied By: family Schedule Follow-up Appointment: Yes Clinical Summary of Care: Electronic Signature(s) Signed: 07/11/2019 11:34:46 AM By: [name] Entered By: [name] on 07/11/2019 11:34:45 [name] ([id number]) -------------------------------------------------------------------------------- Lower Extremity Assessment Details Patient Name: [name] Date of Service: 07/11/2019 10:00 AM Medical Record Number: [id number] Patient Account Number: [id number] Date of Birth/Sex: [birth year] (68 y.o. M) Treating RN: [name] Primary Care Marcin Holte: [name] Other Clinician: Referring Reve Crocket: [name] Treating Maleeah Crossman/Extender: [name], [name] Weeks in Treatment: 62 Electronic Signature(s) Signed: 07/11/2019 10:31:53 AM By: [name] Entered By: [name] on 07/11/2019 10:31:52 [name], [name] ([id number]) -------------------------------------------------------------------------------- Multi Wound Chart Details Patient Name: [name] Date of Service: 07/11/2019 10:00 AM Medical Record Number: [id number] Patient Account Number: [id number] Date of Birth/Sex: June 24, 1951 (68 y.o. M) Treating RN: [name] Primary Care Jamiesha Victoria: [name] Other Clinician: Referring Shirl Weir: [name] Treating Zareth Rippetoe/Extender: [name], [name] Weeks in Treatment: 84 Vital Signs Height(in): 73 Pulse(bpm): 72 Weight(lbs): 210 Blood Pressure(mmHg): 106/64 Body Mass Index(BMI): 28 Temperature(F): 98.0 Respiratory Rate 16 (breaths/min): Photos: [N/A:N/A] Wound Location:  Right Gluteal fold N/A N/A Wounding Event: Pressure Injury N/A N/A Primary Etiology: Pressure Ulcer N/A N/A Comorbid History: Cataracts, Hypertension, N/A N/A Paraplegia Date Acquired: 11/02/2017 N/A N/A Weeks of Treatment: 86 N/A N/A Wound Status: Open N/A N/A Measurements L x W x D 3x2.4x2.5 N/A N/A (cm) Area (cm) : 5.655 N/A N/A Volume (cm) : 14.137 N/A N/A % Reduction in Area: 43.70% N/A N/A % Reduction in Volume: -1306.70% N/A N/A Position 1 (o'clock): 1 Maximum Distance 1 (cm): 4 Tunneling: Yes N/A N/A Classification: Category/Stage IV N/A N/A Exudate Amount: Large N/A N/A Exudate Type: Serous N/A N/A Exudate Color: amber N/A N/A Wound Margin: Epibole N/A N/A Granulation Amount: Large (67-100%) N/A N/A Granulation  Quality: Pink, Hyper-granulation N/A N/A Necrotic Amount: Small (1-33%) N/A N/A Exposed Structures: Fat Layer (Subcutaneous N/A N/A Tissue) Exposed: Yes Muscle: Yes Fascia: No [name], [name]. ([id number]) Tendon: No Joint: No Bone: No Epithelialization: Small (1-33%) N/A N/A Treatment Notes Electronic Signature(s) Signed: 07/11/2019 11:09:06 AM By: [name] Entered By: [name] on 07/11/2019 11:09:05 [name] ([id number]) -------------------------------------------------------------------------------- [place] Details Patient Name: [name] Date of Service: 07/11/2019 10:00 AM Medical Record Number: [id number] Patient Account Number: [id number] Date of Birth/Sex: 1950/10/07 (68 y.o. M) Treating RN: [name] Primary Care Caeleigh Prohaska: [name] Other Clinician: Referring Evangelyn Crouse: [name] Treating Wessley Emert/Extender: [name], [name] Weeks in Treatment: 64 Active Inactive Orientation to the Wound Care Program Nursing Diagnoses: Knowledge deficit related to the wound healing center program Goals: Patient/caregiver will verbalize understanding of the [facility]  Program Date Initiated: 11/30/2017 Target Resolution Date: 12/21/2017 Goal Status: Active Interventions: Provide education on orientation to the wound center Notes: Pressure Nursing Diagnoses: Knowledge deficit related to management of pressures ulcers Goals: Patient/caregiver will verbalize understanding of pressure ulcer management Date Initiated: 05/17/2018 Target Resolution Date: 05/28/2018 Goal Status: Active Interventions: Assess: immobility, friction, shearing, incontinence upon admission and as needed Notes: Wound/Skin Impairment Nursing Diagnoses: Impaired tissue integrity Goals: Patient/caregiver will verbalize understanding of skin care regimen Date Initiated: 11/30/2017 Target Resolution Date: 12/21/2017 Goal Status: Active Ulcer/skin breakdown will have a volume reduction of 30% by week 4 Date Initiated: 11/30/2017 Target Resolution Date: 12/21/2017 Goal Status: Active [name], [name] ([id number]) Interventions: Assess patient/caregiver ability to obtain necessary supplies Assess patient/caregiver ability to perform ulcer/skin care regimen upon admission and as needed Assess ulceration(s) every visit Treatment Activities: Skin care regimen initiated : 11/30/2017 Notes: Electronic Signature(s) Signed: 07/11/2019 11:08:55 AM By: [name] Entered By: [name] on 07/11/2019 11:08:53 [name] ([id number]) -------------------------------------------------------------------------------- Pain Assessment Details Patient Name: [name] Date of Service: 07/11/2019 10:00 AM Medical Record Number: [id number] Patient Account Number: [id number] Date of Birth/Sex: [birth year] (68 y.o. M) Treating RN: [name] Primary Care Biviana Saddler: [name] Other Clinician: Referring Terrian Ridlon: [name] Treating Kashus Karlen/Extender: [name], [name] Weeks in Treatment: 56 Active Problems Location of Pain Severity and Description of  Pain Patient Has Paino No Site Locations Pain Management and Medication Current Pain Management: Electronic Signature(s) Signed: 07/11/2019 2:03:17 PM By: [name] RCP, RRT, CHT Signed: 07/11/2019 3:59:48 PM By: [name] Entered By: [name] on 07/11/2019 10:15:41 [name], [name] ([id number]) -------------------------------------------------------------------------------- Patient/Caregiver Education Details Patient Name: [name] Date of Service: 07/11/2019 10:00 AM Medical Record Number: [id number] Patient Account Number: [id number] Date of Birth/Gender: May 08, 1951 (68 y.o. M) Treating RN: [name] Primary Care Physician: [name] Other Clinician: Referring Physician: Tracie [name] Treating Physician/Extender: Sharalyn Ink in Treatment: 76 Education Assessment Education Provided To: Patient Education Topics Provided Pressure: Handouts: Pressure Ulcers: Care and Offloading Methods: Demonstration, Explain/Verbal Responses: State content correctly Wound/Skin Impairment: Handouts: Caring for Your Ulcer Methods: Demonstration, Explain/Verbal Responses: State content correctly Electronic Signature(s) Signed: 07/11/2019 3:59:48 PM By: [name] Entered By: [name] on 07/11/2019 11:09:48 [name] ([id number]) -------------------------------------------------------------------------------- Wound Assessment Details Patient Name: [name] Date of Service: 07/11/2019 10:00 AM Medical Record Number: [id number] Patient Account Number: [id number] Date of Birth/Sex: March 15, 1951 (68 y.o. M) Treating RN: [name] Primary Care Sueann Brownley: [name] Other Clinician: Referring Corian Handley: [name] Treating Alexah Kivett/Extender: [name], [name] Weeks in Treatment: 84 Wound Status Wound Number: 1 Primary Etiology: Pressure Ulcer  Wound Location: Right  Gluteal fold Wound Status: Open Wounding Event: Pressure Injury Comorbid History: Cataracts, Hypertension, Paraplegia Date Acquired: 11/02/2017 Weeks Of Treatment: 84 Clustered Wound: No Photos Wound Measurements Length: (cm) 3 % Reductio Width: (cm) 2.4 % Reductio Depth: (cm) 2.5 Epithelial Area: (cm) 5.655 Tunneling Volume: (cm) 14.137 Positi Maximum n in Area: 43.7% n in Volume: -1306.7% ization: Small (1-33%) : Yes on (o'clock): 1 Distance: (cm) 4 Undermining: No Wound Description Classification: Category/Stage IV Foul Odor Wound Margin: Epibole Slough/Fib Exudate Amount: Large Exudate Type: Serous Exudate Color: amber After Cleansing: No rino Yes Wound Bed Granulation Amount: Large (67-100%) Exposed Structure Granulation Quality: Pink, Hyper-granulation Fascia Exposed: No Necrotic Amount: Small (1-33%) Fat Layer (Subcutaneous Tissue) Exposed: Yes Necrotic Quality: Adherent Slough Tendon Exposed: No Muscle Exposed: Yes Necrosis of Muscle: No Joint Exposed: No [name], STOIA. ([id number]) Bone Exposed: No Treatment Notes Wound #1 (Right Gluteal fold) Notes zinc to peri wound, silvercel, abd and tape Electronic Signature(s) Signed: 07/11/2019 10:31:34 AM By: [name] Entered By: [name] on 07/11/2019 10:31:33 [name] ([id number]) -------------------------------------------------------------------------------- Vitals Details Patient Name: [name] Date of Service: 07/11/2019 10:00 AM Medical Record Number: [id number] Patient Account Number: [id number] Date of Birth/Sex: [birth year] (68 y.o. M) Treating RN: [name] Primary Care Lynze Reddy: [name] Other Clinician: Referring Solymar Grace: [name] Treating Zelig Gacek/Extender: [name], [name] Weeks in Treatment: 52 Vital Signs Time Taken: 10:12 Temperature (F): 98.0 Height (in): 73 Pulse (bpm): 66 Weight (lbs): 210 Respiratory Rate  (breaths/min): 16 Body Mass Index (BMI): 27.7 Blood Pressure (mmHg): 106/64 Reference Range: 80 - 120 mg / dl Electronic Signature(s) Signed: 07/11/2019 2:03:17 PM By: [name] RCP, RRT, CHT Entered By: [name] on 07/11/2019 10:16:06

## 2019-07-11 NOTE — Progress Notes (Addendum)
[name] ([id number]) Visit Report for 07/11/2019 Chief Complaint Document Details Patient Name: [name], [name] Date of Service: 07/11/2019 10:00 AM Medical Record Number: [id number] Patient Account Number: [id number] Date of Birth/Sex: 1951-01-30 (68 y.o. M) Treating RN: Harold Barban Primary Care Provider: [name] Other Clinician: Referring Provider: [name] Treating Provider/Extender: [name], [name] in Treatment: 65 Information Obtained from: Patient Chief Complaint Right gluteal fold ulcer Electronic Signature(s) Signed: 07/11/2019 10:06:20 AM By: Worthy Keeler [name] Entered By: Worthy Keeler on 07/11/2019 10:06:19 [name], [name] ([id number]) -------------------------------------------------------------------------------- Debridement Details Patient Name: [name] Date of Service: 07/11/2019 10:00 AM Medical Record Number: [id number] Patient Account Number: [id number] Date of Birth/Sex: Sep 27, 1950 (68 y.o. M) Treating RN: Harold Barban Primary Care Provider: [name] Other Clinician: Referring Provider: [name] Treating Provider/Extender: [name], [name] Weeks in Treatment: 23 Debridement Performed for Wound #1 Right Gluteal fold Assessment: Performed By: Physician [name], [name], [name] Debridement Type: Debridement Level of Consciousness (Pre- Awake and Alert procedure): Pre-procedure Verification/Time Yes - 10:45 Out Taken: Start Time: 10:45 Pain Control: Lidocaine Total Area Debrided (L x W): 3 (cm) x 2.4 (cm) = 7.2 (cm) Tissue and other material Viable, Non-Viable, Slough, Subcutaneous, Slough, Hyper-granulation debrided: Level: Skin/Subcutaneous Tissue Debridement Description: Excisional Instrument: Curette Bleeding: Moderate Hemostasis Achieved: Pressure End Time: 10:50 Procedural Pain: 0 Post Procedural Pain: 0 Response to Treatment: Procedure was tolerated  well Level of Consciousness Awake and Alert (Post-procedure): Post Debridement Measurements of Total Wound Length: (cm) 3 Stage: Category/Stage IV Width: (cm) 2.4 Depth: (cm) 2.5 Volume: (cm) 14.137 Character of Wound/Ulcer Post Improved Debridement: Post Procedure Diagnosis Same as Pre-procedure Electronic Signature(s) Signed: 07/11/2019 11:11:50 AM By: Harold Barban Signed: 07/11/2019 6:06:28 PM By: Worthy Keeler [name] Entered By: Harold Barban on 07/11/2019 11:11:49 [name], [name] ([id number]) -------------------------------------------------------------------------------- HPI Details Patient Name: [name] Date of Service: 07/11/2019 10:00 AM Medical Record Number: [id number] Patient Account Number: [id number] Date of Birth/Sex: 16-Dec-1950 (68 y.o. 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 He states that there is no history of diabetes that he is aware of. He does have issues with his prostate and is currently receiving treatment for this by way of oral medication. With that being said I do not have a lot of details in that regard. Nonetheless the patient presents today as a result of having been referred to [place] by another provider initially home health was set to come out and take care of his wound although due to the fact that he apparently drives he's not able to receive home health. His wife is therefore trying to help take care of this wound within although they have been struggling with what exactly to  do at this point. She states that she can do some things but she is definitely not a nurse and does have some issues with looking at blood. The good news is the wound does not appear to be too deep and is fairly superficial at this point. There is no slough noted there is some nonviable skin noted around the surface of the  wound and the perimeter at this point. The central portion of the wound appears to be very good with a dermal layer noted this does not appear to be again deep enough to extend it to subcutaneous tissue at this point. 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 In the pump he has Dilaudid and baclofen. 12/07/17 on evaluation today patient presents for follow-up concerning his ongoing lower back thigh ulcer on the right. He states that he did not get the supplies ordered and therefore has not really been able to perform the dressing changes as directed exactly. His wife was able to get some Boarder Foam Dressing's from the drugstore and subsequently has been using hydrogel which did help to a degree in the wound does appear to be able smaller. There is actually more drainage this week noted than previous. 12/21/17 on evaluation today patient appears to be doing rather well in regard to his right gluteal ulcer. He has been tolerating the dressing changes without complication. There does not appear to be any evidence of infection at this point in time. Overall the wound does seem to be making some progress as far as the edges are concerned there's not as much in the way of overlapping of the external wound edges and he has a good epithelium to wound bed border for the most part. This however is not true right at the 12 o'clock location over the span of a little over a centimeters which actually will require debridement today to clean this away and hopefully allow it to continue to  heal more appropriately. 12/28/17 on evaluation today patient appears to be doing rather well in regard to his ulcer in the left gluteal region. He's been tolerating the dressing changes without complication. Apparently he has had some difficulty getting his dressing material. Apparently there's been some confusion with ordering we're gonna check into this. Nonetheless overall he's been showing signs of improvement which is good news. Debridement is not required today. 01/04/18 on evaluation today patient presents for follow-up concerning his right gluteal ulcer. He has been tolerating the dressing changes fairly well. On inspection today it appears he may actually have some maceration them concerned about the fact that he may be developing too much moisture in and around the wound bed which can cause delay in healing. With that being said he unfortunately really has not showed significant signs of improvement since last week's evaluation in fact this may even be just the little bit/slightly larger. Nonetheless he's been having a lot of discomfort I'm not sure this is even related to the wound as he has no pain when I'm to breeding or otherwise cleaning the wound during evaluation today. Nonetheless this is something that we did recommend he talked to his pain specialist concerning. 01/11/18 on evaluation today patient appears to be doing better in regard to his ulceration. He has been tolerating the dressing changes without complication. With that being said overall there's no evidence of infection which is good news. The only thing is he did receive the hatch affair blue classic versus the ready nonetheless I feel like this is perfectly fine and appears to have done well for him over the past week. 01/25/18 on evaluation today patient's wound actually appears to be a little bit larger than during the last evaluation. The good CHOUA, REISMAN. ([id number]) news is the majority of the wound  edges  actually appear to be fairly firmly attached to the wound bed unfortunately again we're not really making progress in regard to the size. Roughly the wound is about the same size as when I first saw him although again the wound margin/edges appear to be much better. 02/01/18 on evaluation today patient actually appears to be doing very well in regard to his wound. Applying the Prisma dry does seem to be better although he does still have issues with slow progression of the wound. There was a slight improvement compared to last week's measurements today. Nonetheless I have been considering other options as far as the possibility of Theraskin or even a snap vac. In general I'm not sure that the Theraskin due to location of the wound would be a very good idea. Nonetheless I do think that a snap vac could be a possibility for the patient and in fact I think this could even be an excellent way to manage the wound possibly seeing some improvement in a very rapid fashion here. Nonetheless this is something that we would need to get approved and I did have a lengthy conversation with the patient about this today. 02/08/18 on evaluation today patient appears to be doing a little better in regard to his ulcer. He has been tolerating the dressing changes without complication. Fortunately despite the fact that the wound is a little bit smaller it's not significantly so unfortunately. We have discussed the possibility of a snap vac we did check with insurance this is actually covered at this point. Fortunately there does not appear to be any sign of infection. Overall I'm fairly pleased with how things seem to be appearing at this point. 02/15/18 on evaluation today patient appears to be doing rather well in regard to his right gluteal ulcer. Unfortunately the snap vac did not stay in place with his sheer and friction this came loose and did not seem to maintain seal very well. He worked for about two days and it  did seem to do very well during that time according to his wife but in general this does not seem to be something that's gonna be beneficial for him long-term. I do believe we need to go back to standard dressings to see if we can find something that will be of benefit. 03/02/18- He is here in follow up evaluation; there is minimal change in the wound. He will continue with the same treatment plan, would consider changing to iodosrob/iodoflex if ulcer continues to to plateau. He will follow up next week 03/08/18 on evaluation today patient's wound actually appears to be about the same size as when I previously saw him several weeks back. Unfortunately he does have some slightly dark discoloration in the central portion of the wound which has me concerned about pressure injury. I do believe he may be sitting for too long a period of time in fact he tells me that "I probably sit for much too long". He does have some Slough noted on the surface of the wound and again as far as the size of the wound is concerned I'm really not seeing anything that seems to have improved significantly. 03/15/18 on evaluation today patient appears to be doing fairly well in regard to his ulcer. The wound measured pretty much about the same today compared to last week's evaluation when looking at his graph. With that being said the area of bruising/deep tissue injury that was noted last week I do not see at this  point. He did get a new cushion fortunately this does seem to be have been of benefit in my pinion. It does appear that he's been off of this more which is good news as well I think that is definitely showing in the overall wound measurements. With that being said I do believe that he needs to continue to offload I don't think that the fact this is doing better should be or is going to allow him to not have to offload and explain this to him as well. Overall he seems to be in agreement the plan I think he understands.  The overall appearance of the wound bed is improved compared to last week I think the Iodoflex has been beneficial in that regard. 03/29/18 on evaluation today patient actually appears to be doing rather well in regard to his wound from the overall appearance standpoint he does have some granulation although there's some Slough on the surface of the wound noted as well. With that being said he unfortunately has not improved in regard to the overall measurement of the wound in volume or in size. I did have a discussion with him very specifically about offloading today. He actually does work although he mainly is just sitting throughout the day. He tells me he offloads by "lifting himself up for 30 seconds off of his chair occasionally" purchase from [employer] which does seem to have helped. And he has a new cushion that he with that being said he's also able to stand some for a very short period of time but not significant enough I think to provide appropriate offloading. I think the biggest issue at this point with the wound and the fact is not healing as quickly as we would like is due to the fact that he is really not able to appropriately offload while at work. He states the beginning after his injury he actually had a bed at his job that he could lay on in order to offload and that does seem to have been of help back at that time. Nonetheless he had not done this in quite some time unfortunately. I think that could be helpful for him this is something I would like for him to look into. 04/05/18 on evaluation today patient actually presents for follow-up concerning his right gluteal ulcer. Again he really is not significantly improved even compared to last week. He has been tolerating the dressing changes without complication. With that being said fortunately there appears to be no evidence of infection at this time. He has been more proactive in trying to offload. [name], [name]  ([id number]) 04/12/18 on evaluation today patient actually appears to be doing a little better in regard to his wound and the right gluteal fold region. He's been tolerating the dressing changes since removing the oasis without complication. However he was having a lot of burning initially with the oasis in place. He's unsure of exactly why this was given so much discomfort but he assumes that it was the oasis itself causing the problem. Nonetheless this had to be removed after about three days in place although even those three days seem to have made a fairly good improvement in regard to the overall appearance of the wound bed. In fact is the first time that he's made any improvement from the standpoint of measurements in about six weeks. He continues to have no discomfort over the area of the wound itself which leads me to wonder why he was having  the burning with the oasis when he does not even feel the actual debridement's themselves. I am somewhat perplexed by this. 04/19/18 on evaluation today patient's wound actually appears to be showing signs of epithelialization around the edge of the wound and in general actually appears to be doing better which is good news. He did have the same burning after about three days with applying the Endoform last week in the same fashion that I would generally apply a skin substitute. This seems to indicate that it's not the oasis to cause the problem but potentially the moisture buildup that just causes things to burn or there may be some other reaction with the skin prep or Steri-Strips. Nonetheless I'm not sure that is gonna be able to tolerate any skin substitute for a long period of time. The good news is the wound actually appears to be doing better today compared to last week and does seem to finally be making some progress. 04/26/18 on evaluation today patient actually appears to be doing rather well in regard to his ulcer in the right gluteal fold.  He has been tolerating the dressing changes without complication which is good news. The Endoform does seem to be helping although he was a little bit more macerated this week. This seems to be an ongoing issue with fluid control at this point. Nonetheless I think we may be able to add something like Drawtex to help control the drainage. 05/03/18 on evaluation today patient appears to actually be doing better in regard to the overall appearance of his wound. He has been tolerating the dressing changes without complication. Fortunately there appears to be no evidence of infection at this time. I really feel like his wound has shown signs as of today of turning around last week I thought so as well and definitely he could be seen in this week's overall appearance and measurements. In general I'm very pleased with the fact that he finally seems to be making a steady but sure progress. The patient likewise is very pleased. 05/17/18 on evaluation today patient appears to be doing more poorly unfortunately in regard to his ulcer. He has been tolerating the dressing changes without complication. With that being said he tells me that in the past couple of days he and his wife have noticed that we did not seem to be doing quite as well is getting dark near the center. Subsequently upon evaluation today the wound actually does appear to be doing worse compared to previous. He has been tolerating the dressing changes otherwise and he states that he is not been sitting up anymore than he was in the past from what he tells me. Still he has continued to work he states "I'm tired of dealing with this and if I have to just go home and lay in the bed all the time that's what I'll do". Nonetheless I am concerned about the fact that this wound does appear to be deeper than what it was previous. 05/24/18 upon evaluation today patient actually presents after having been in the hospital due to what was presumed to  be sepsis secondary to the wound infection. He had an elevated white blood cell count between 14 and 15. With that being said he does seem to be doing somewhat better now. His wound still is giving him some trouble nonetheless and he is obviously concerned about the fact likely talked about that this does seem to go more deeply than previously noted. I did review his  wound culture which showed evidence of Staphylococcus aureus him and group B strep. Nonetheless he is on antibiotics, Levaquin, for this. Subsequently I did review his intake summary from the hospital as well. I also did look at the CT of the lumbar spine with contrast that was performed which showed no bone destruction to suggest lumbar disguises/osteomyelitis or sacral osteomyelitis. There was no paraspinal abscess. Nonetheless it appears this may have been more of just a soft tissue infection at this point which is good news. He still is nonetheless concerned about the wound which again I think is completely reasonable considering everything he's been through recently. 05/31/18 on evaluation today on evaluation today patient actually appears to be showing signs of his wound be a little bit deeper than what I would like to see. Fortunately he does not show any signs of significant infection although his temperature was 99 today he states he's been checking this at home and has not been elevated. Nonetheless with the undermining that I'm seeing at this point I am becoming more concerned about the wound I do think that offloading is a key factor here that is preventing the speedy recovery at this point. There does not appear to be any evidence of again over infection noted. He's been using Santyl currently. 06/07/18 the patient presents today for follow-up evaluation regarding the left ulcer in the gluteal region. He has been tolerating the Wound VAC fairly well. He is obviously very frustrated with this he states that to mean is really  getting in his way. There does not appear to be any evidence of infection at this time he does have a little bit of odor I do not necessarily associate this with infection just something that we sometimes notice with Wound VAC therapy. With that being said I can definitely catch a tone of discontentment overall in the patient's demeanor today. This when he was previously in the hospital [name], [name]. ([id number]) an CT scan was done of the lumbar region which did not reveal any signs of osteomyelitis. With that being said the pelvis in particular was not evaluated distinctly which means he could still have some osteonecrosis I. Nonetheless the Wound VAC was started on Thursday I do want to get this little bit more time before jumping to a CT scan of the pelvis although that is something that I might would recommend if were not see an improvement by that time. 06/14/18 on evaluation today patient actually appears to be doing about the same in regard to his right gluteal ulcer. Again he did have a CT scan of the lumbar spine unfortunately this did not include the pelvis. Nonetheless with the depth of the wound that I'm seeing today even despite the fact that I'm not seeing any evidence of overt cellulitis I believe there's a good chance that we may be dealing with osteomyelitis somewhere in the right Ischial region. No fevers, chills, nausea, or vomiting noted at this time. 06/21/18 on evaluation today patient actually appears to be doing about the same with regard to his wound. The tunnel at 6 o'clock really does not appear to be any deeper although it is a little bit wider. I think at this point you may want to start packing this with white phone. Unfortunately I have not got approval for the CT scan of the pelvis as of yet due to the fact that Medicare apparently has been denied it due to the diagnosis codes not being appropriate according to Medicare for  the test requested. With that being  said the patient cannot have an MRI and therefore this is the only option that we have as far as testing is concerned. The patient has had infection and was on antibiotics and been added code for cellulitis of the bottom to see if this will be appropriate for getting the test approved. Nonetheless I'm concerned about the infection have been spread deeper into the Ischial region. 06/28/18 on evaluation today patient actually appears to be doing rather well all things considered in regard to the right gluteal ulcer. He has been tolerating the dressing changes without complication. With that being said the Wound VAC he states does have to be replaced almost every day or at least reinforced unfortunately. Patient actually has his CT scan later this morning we should have the results by tomorrow. 07/05/18 on evaluation today patient presents for follow-up concerning his right Ischial ulcer. He did see the surgeon Dr. Lysle Pearl last week. They were actually very happy with him and felt like he spent a tremendous amount of time with them as far as discussing his situation was concerned. In the end Dr. Lysle Pearl did contact me as well and determine that he would not recommend any surgical intervention at this point as he felt like it would not be in the patient's best interest based on what he was seeing. He recommended a referral to infectious disease. Subsequently this is something that Dr. Ines Bloomer office is working on setting up for the patient. As far as evaluation today is concerned the patient's wound actually appears to be worse at this point. I am concerned about how things are progressing and specifically about infection. I do not feel like it's the deeper but the area of depth is definitely widening which does have me concerned. No fevers, chills, nausea, or vomiting noted at this time. I think that we do need initiate antibiotic therapy the patient has an allow allergy to amoxicillin/penicillin he states  that he gets a rash since childhood. Nonetheless she's never had the issues with [employer] or cephalosporins in general but he is aware of. 07/27/18 on evaluation today patient presents following admission to the hospital on 07/09/18. He was subsequently discharged on 07/20/18. On 07/15/18 the patient underwent irrigation and debridement was soft tissue biopsy and bone biopsy as well as placement of a Wound VAC in the OR by Dr. Celine Ahr. During the hospital course the patient was placed on a Wound VAC and recommended follow up with surgery in three weeks actually with Dr. Delaine Lame who is infectious disease. The patient was on vancomycin during the hospital course. 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 He also had a bone culture which revealed evidence of methicillin-resistant staph aureus. He is updated CT scan 07/09/18 reveals that he had progression of the which was performed on wound to breakdown down to the trochanter where he actually had irregularities there as well suggestive of osteomyelitis. This was a change just since 9 December when we last performed a CT scan. Obviously this one had gone downhill quite significantly and rapidly. At this point upon evaluation I feel like in general the patient's wound seems to be doing fairly well all things considered upon my evaluation today. Obviously this is larger and deeper than what I previously evaluated but at the same time he seems to be making some progress as far as the appearance of the granulation tissue is concerned. I'm happy in that regard. No fevers,  chills, nausea, or vomiting noted at this time. He is on IV vancomycin and Rocephin at the facility. He is currently in [facility]. 08/03/18 upon evaluation today patient's wound appears to be doing better in regard to the overall appearance at this point in time. Fortunately he's been tolerating the Wound VAC without complication and states that  the facility has been taking excellent care of the wound site. Overall I see some Slough noted on the surface which I am going to attempt sharp debridement today of but nonetheless other than this I feel like he's making progress. 08/09/18 on evaluation today patient's wound appears to be doing much better compared to even last week's evaluation. Do believe that the Wound VAC is been of great benefit for him. He has been tolerating the dressing changes that is the Wound [name], [name]. ([id number]) VAC without any complication and he has excellent granulation noted currently. There is no need for sharp debridement at this point. 08/16/18 on evaluation today patient actually appears to be doing very well in regard to the wound in the right gluteal fold region. This is showing signs of progress and again appears to be very healthy which is excellent news. Fortunately there is no sign of active infection by way of odor or drainage at this point. Overall I'm very pleased with how things stand. He seems to be tolerating the Wound VAC without complication. 08/23/18 on evaluation today patient actually appears to be doing better in regard to his wound. He has been tolerating the Wound VAC without complication and in fact it has been collecting a significant amount of drainage which I think is good news especially considering how the wound appears. Fortunately there is no signs of infection at this time definitely nothing appears to be worse which is good news. He has not been started on the Bactrim and Flagyl that was recommended by Dr. Delaine Lame yet. I did actually contact her office this morning in order to check and see were things are that regard their gonna be calling me back. 08/30/18 on evaluation today patient actually appears to show signs of excellent improvement today compared to last evaluation. The undermining is getting much better the wound seems to be feeling quite nicely and I'm very  pleased that the granulation in general. With that being said overall I feel like the patient has made excellent progress which is great news. No fevers, chills, nausea, or vomiting noted at this time. 09/06/18 on evaluation today patient actually appears to be doing rather well in regard to his right gluteal ulcer. This is showing signs of improvement in overall I'm very pleased with how things seem to be progressing. The patient likewise is please. Overall I see no evidence of infection he is about to complete his oral antibiotic regimen which is the end of the antibiotics for him in just about three days. 09/13/18 on evaluation today patient's right Ischial ulcer appears to be showing signs of continued improvement which is excellent news. He's been tolerating the dressing changes without complication. Fortunately there's no signs of infection and the wound that seems to be doing very well. 09/28/18 on evaluation today patient appears to be doing rather well in regard to his right Ischial ulcer. He's been tolerating the Wound VAC without complication he knows there's much less drainage than there used to be this obviously is not a bad thing in my pinion. There's no evidence of infection despite the fact is but nothing about it now  for several weeks. 10/04/18 on evaluation today patient appears to be doing better in regard to his right Ischial wound. He has been tolerating the Wound VAC without complication and I do believe that the silver nitrate last week was beneficial for him. Fortunately overall there's no evidence of active infection at this time which is great news. No fevers, chills, nausea, or vomiting noted at this time. 10/11/18 on evaluation today patient actually appears to be doing rather well in regard to his Ischial ulcer. He's been tolerating the Wound VAC still without complication I feel like this is doing a good job. No fevers, chills, nausea, or vomiting noted at  this time. 11/01/18 on evaluation today patient presents after having not been seen in our clinic for several weeks secondary to the fact that he was on evaluation today patient presents after having not been seen in our clinic for several weeks secondary to the fact that he was in a skilled nursing facility which was on lockdown currently due to the covert 19 national emergency. Subsequently he was discharged from the facility on this past Friday and subsequently made an appointment to come in to see yesterday. Fortunately there's no signs of active infection at this time which is good news and overall he does seem to have made progress since I last saw. Overall I feel like things are progressing quite nicely. The patient is having no pain. 11/08/18 on evaluation today patient appears to be doing okay in regard to his right gluteal ulcer. He has been utilizing a Wound VAC home health this changing this at this point since he's home from the skilled nursing facility. Fortunately there's no signs of obvious active infection at this time. Unfortunately though there's no obvious active infection he is having some maceration and his wife states that when the sheets of the Wound VAC office on Sunday when it broke seal that he ended up having significant issues with some smell as well there concerned about the possibility of infection. Fortunately there's No fevers, chills, nausea, or vomiting noted at this time. 11/15/18 on evaluation today patient actually appears to be doing well in regard to his right gluteal ulcer. He has been tolerating the dressing changes without complication. Specifically the Wound VAC has been utilized up to this point. Fortunately there's no signs of infection and overall I feel like he has made progress even since last week when I last saw him. I'm actually fairly happy with the overall appearance although he does seem to have somewhat of a hyper granular [name], [name]  ([id number]) overgrowth in the central portion of the wound which I think may require some sharp debridement to try flatness out possibly utilizing chemical cauterization following. 11/23/18 on evaluation today patient actually appears to be doing very well in regard to his sacral ulcer. He seems to be showing signs of improvement with good granulation. With that being said he still has the small area of hyper granulation right in the central portion of the wound which I'm gonna likely utilize silver nitrate on today. Subsequently he also keeps having a leak at the 6 o'clock location which is unfortunate we may be able to help out with some suggestions to try to prevent this going forward. Fortunately there's no signs of active infection at this time. 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 He's been tolerating the dressing changes without complication.  Fortunately there's no signs of active infection at this time. I've been rather pleased with how things have progressed there still some evidence of pressure getting to the area with some redness right around the immediate wound opening. Nonetheless other than this I'm not seeing any significant complications or issues the wound is somewhat hyper granular. Upon discussing with the patient and his wife today I'm not sure that the wound is being packed to the base with the foam at this point. And if it's not been packed fully that may be part of the reason why is not seen as much improvement as far as the granulation from the base out. Again we do not want pack too tightly but we need some of the firm to get to the base of the wound. I discussed this with patient and his wife today. 12/06/18 on evaluation today patient appears to be doing well in regard to his right gluteal pressure ulcer. He's been tolerating the dressing changes without complication.  Fortunately there's no signs of active infection. He still has some hyper granular tissue and I do think it would be appropriate to continue with the chemical cauterization as of today. 12/16/18 on evaluation today patient actually appears to be doing okay in regard to his right gluteal ulcer. He is been tolerating the dressing changes without complication including the Wound VAC. Overall I feel like nothing seems to be worsening I do feel like that the hyper granulation buds in the central portion of the wound have improved to some degree with the silver nitrate. We will have to see how things continue to progress. 12/20/18 on evaluation today patient actually appears to be doing much worse in my pinion even compared to last week's evaluation. Unfortunately as opposed to showing any signs of improvement the areas of hyper granular tissue in the central portion of the wound seem to be getting worse. Subsequently the wound bed itself also seems to be getting deeper even compared to last week which is both unusual as well as concerning since prior he had been shown signs of improvement. Nonetheless I think that the issue could be that he's actually having some difficulty in issues with a deeper infection. There's no external signs of infection but nonetheless I am more worried about the internal, osteomyelitis, that could be restarting. He has not been on antibiotics for some time at this point. I think that it may be a good idea to go ahead and started back on an antibiotic therapy while we wait to see what the testing shows. 12/27/18 on evaluation today patient presents for follow-up concerning his left gluteal fold wound. Fortunately he appears to be doing well today. I did review the CT scan which was negative for any signs of osteomyelitis or acute abnormality this is excellent news. Overall I feel like the surface of the wound bed appears to be doing significantly better today compared to previously  noted findings. There does not appear any signs of infection nor does he have any pain at this time. 01/03/19 on evaluation today patient actually appears to be doing quite well in regard to his ulcer. Post debridement last week he really did not have too much bleeding which is good news. Fortunately today this seems to be doing some better but we still has some of the hyper granular tissue noted in the base of the wound which is gonna require sharp debridement today as well. Overall I'm pleased with how things seem to be progressing  since we switched away from the Wound VAC I think he is making some progress. 01/10/19 on evaluation today patient appears to be doing better in regard to his right gluteal fold ulcer. He has been tolerating the dressing changes without complication. The debridement to seem to be helping with current away some of the poor hyper granular tissue bugs throughout the region of his gluteal fold wound. He's been tolerating the dressing changes otherwise without complication which is great news. No fevers, chills, nausea, or vomiting noted at this time. 01/17/19 on evaluation today patient actually appears to be doing excellent in regard to his wound. He's been tolerating the dressing changes without complication. Fortunately there is no signs of active infection at this time which is great news. No fevers, chills, nausea, or vomiting noted at this time. 01/24/19 on evaluation today patient actually appears to be doing quite well with regard to his ulcer. He has been tolerating the dressing changes without complication. Fortunately there's no signs of active infection at this time. Overall been very pleased with the progress that he seems to be making currently. 01/31/19-Patient returns at 1 week with apparent similarity in dimensions to the wound, with no signs of infection, he has been [name], [name]. ([id number]) changing dressings twice a day 02/08/19 upon evaluation today  patient actually appears to be doing well with regard to his right Ischial ulcer. The wound is not appear to be quite as deep and seems to be making progress which is good news. With that being said I'm still reluctant to go back to the Wound VAC at this point. He's been having to change the dressings twice a day which is a little bit much in my pinion from the wound care supplies standpoint. I think that possibly attempting to utilize extras orbit may be beneficial this may also help to prevent any additional breakdown secondary to fluid retention in the wound itself. The patient is in agreement with giving this a try. 02/15/19 on evaluation today patient actually appears to be doing decently well with regard to his ulcer in the right to gluteal fold location. He's been tolerating the dressing changes without complication. Fortunately there is no signs of active infection at this time. He is able to keep the current dressing in place more effectively for a day at a time whereas before he was having a changes to to three times a day. The actions or has been helpful in this regard. Fortunately there's no signs of anything getting worse and I do feel like he showing signs of good improvement with regard to the wound bed status. 02/22/2019 patient appears to be doing very well today with regard to his ulcer in the gluteal fold. Fortunately there is no signs of active infection and he has been tolerating the dressing changes without any complication. Overall extremely pleased with how things seem to be progressing. He has much less of the hyper granular projections within the wound these have slowly been debrided away and he seems to be doing well. The wound bed is more uniform. 03/01/19 on evaluation today patient appears to be doing unfortunately about the same in regard to his gluteal ulcer. He's been tolerating the dressing changes without complication. Fortunately there's no signs of active infection at  this time. With that being said he continues to develop these hyper granular projections which I'm unsure of exactly what they are and why they are rising. Nonetheless I explained to the patient that I do believe  it would be a good idea for [place] to stand a biopsy sample for pathology to see if that can shed any light on what exactly may be going on here. Fortunately I do not see any obvious signs of infection. With that being said the patient has had a little bit more drainage this week apparently compared to last week. 03/08/2019 on evaluation today patient actually appears to look somewhat better with regard to the appearance of his wound bed at this time. This is good news. Overall I am very pleased with how things seem to have progressed just in the past week with a switch to the [facility] ([facility] dressing. I think that has been beneficial for him. With that being said at this time the patient is concerned about his biopsy that I sent off last week unfortunately I do not have that report as of yet. Nonetheless we have called to obtain this and hopefully will hear back from the lab later this morning. 03/15/19 on evaluation today patient's wound actually appears to be doing okay today with regard to the overall appearance of the wound bed. He has been tolerating the dressing changes without complication he still has hyper granular tissue noted but fortunately that seems to be minimal at this point compared to some of what we've seen in the past. Nonetheless I do think that he is still having some issues currently with some of this type of granulation the biopsy and since all showed nothing more than just evidence of granulation tissue. Therefore there really is nothing different to initiate or do at this point. 03/24/19 on evaluation today patient appears to be doing a little better with regard to his ulcer. He's been tolerating the dressing changes without complication. Fortunately there is no signs of  active infection at this time. No fevers, chills, nausea, or vomiting noted at this time. I'm overall pleased with how things seem to be progressing. 03/29/2019 on evaluation today patient appears to be doing about the same in regard to his ulcer in the right gluteal fold. Unfortunately he is not seeming to make a lot of progress and the wound is somewhat stalled. There is no signs of active infection externally but I am concerned about the possibility of infection continuing in the ischial location which previously he did have infection noted. Again is not able to have an MRI so probably our best option for testing for this would be a triple phase bone scan which will detect subtle changes in the bone more so than plain film x-rays. In the past they really have not been beneficial and in fact the CT scans even have been somewhat questionable at times. Nonetheless there is no signs of systemic infection which is at least good news but again his wound is not healing at all on the predicted schedule. 04/05/19 on evaluation today patient appears to be doing well all things considering with regard to his wound and the right gluteal fold. He actually has his triple bone scan scheduled for sometime in the next couple of days. With that being said I've also been looking to other possibilities of what could be causing hyper granular tissue were looking into the bone scan again for evaluating for the risk or possibility of infection deeper and I'm also gonna go ahead and see about obtaining a deep tissue culture today to send and see if there's any evidence of infection noted on culture. He's in agreement with that plan. 04/12/2019 on evaluation today patient  presents for reevaluation here in our clinic concerning ongoing issues with his right gluteal fold ulcer. 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 It was discussed with him at that point that I think it would be appropriate JEZRAEL, KORB. ([id number]) for him to proceed with hyperbaric oxygen therapy especially in light of the fact that he is previously been on IV antibiotics at the beginning of the year for close to 3 months followed by several weeks of oral antibiotics that was all prescribed by infectious disease. He had surgical debridement around Christmas December 2019 due to an abscess and osteomyelitis of the ischial bone. Unfortunately this has not really proceeded as well as we would have liked and again we did a CT scan even a couple months ago as he cannot have an MRI secondary to having issues with both a pain pump as well as a spinal cord stimulator which prevent him from going into an MRI machine. With that being said there were chronic changes noted at the ischial tuberosity which had progressed since December 2019 there was no evidence of fluid collection on that initial CT scan. With that being said that was on January 21, 2019. I am not sure that it was a sensitive enough test as compared to an MRI and then subsequently I ordered a bone scan for him which was actually completed on 03/29/2019 and this revealed that he does indeed have positive osteomyelitis involving the right ischial tuberosity. This is adjacent to the ulcer and I think is the reason that his ulcer is not healing. Subsequently I am in a place him back on oral antibiotics today unfortunately his wound culture showed just mixed gram-negative flora with no specific findings of a predominant organism. Nonetheless being with the location I think a good broad-spectrum antibiotic for gram-negative's is a good choice at this point he is previously taken Levaquin without significant issues and I think that is an appropriate antibiotic for him at this time. 04/19/2019 on evaluation today patient actually appears to be doing fairly well with regard to his  wound. He has been tolerating the dressing changes without complication. Fortunately there is no signs of active infection and he has been taking the oral antibiotics at this time. Subsequently we did make a referral to infectious disease although Dr. Steva Ready wants the patient to be seen by general surgery first in order apparently to see if there is anything from a surgical standpoint that should be done prior to initiation of IV antibiotic therapy. Again the patient is okay with actually seeing Dr. Celine Ahr whom he has seen before we will make that referral. 04/26/2019 on evaluation today patient actually appears to be doing well with regard to his wound. He has been tolerating the dressing changes without complication. Fortunately there is no signs of active infection at this time. I do believe that the hyperbaric oxygen therapy along with the antibiotics which I prescribed at this point have been doing well for him. With that being said he has not seen Dr. Celine Ahr the surgeon yet in fact they have not been contacted for scheduling an appointment as of yet. Subsequently the patient is not on antibiotics currently by IV Dr. Steva Ready did want him to see Dr. Celine Ahr first which we are working on trying to get scheduled. We again give the information to the patient today for Dr. Celine Ahr  and her number as far as contacting their office to see about getting something scheduled. Again were looking for whether or not they recommend any surgical intervention. 05/03/2019 on evaluation today patient appears to be doing about the same with regard to his wound there may be a little bit of filling in in regard to the base of the wound but still he has quite a bit of hyper granular tissue that I am seeing today. I believe that he may may need a more aggressive sharp debridement possibly even by the surgeon or under anesthesia in order to clear away some of the hyper granular material in order to help allow for  appropriate granulation to fill in. We have made a referral to Dr. Celine Ahr unfortunately it sounds as if they did not receive the referral we contacted them today they should be get in touch with the patient. Depending on how things show from that standpoint the patient may need to see Dr. Ardyth Gal who is the infectious disease specialist although he really does not want a PICC line again. No fevers, chills, nausea, vomiting, or diarrhea. He is tolerating hyperbaric oxygen therapy very well. 05/12/2019 on evaluation today patient presents for follow-up visit concerning his right gluteal fold ulcer. He did see Dr. Celine Ahr the surgeon who previously evaluated his wound and she actually felt like he was doing quite well with regard to the wound based on what she was seen. He does seem to be responding to some degree with the oral antibiotics along with hyperbaric oxygen therapy at this point she did not see any evidence or need for further surgical intervention at this point. She recommended deferring additional or ongoing antibiotic therapy to Dr. Steva Ready at her discretion. Fortunately there is no signs of active systemic infection. No fevers, chills, nausea, vomiting, or diarrhea. 10/29; patient I am seeing really for the first time today in terms of his wound. 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 He is being treated with hyperbaric oxygen for underlying osteomyelitis most recently documented by a three-phase bone scan. He has been on Levaquin for roughly a month and he is out of these and asked me for a refill. Most recent cultures were negative although I do not think these were bone cultures. He has a very irregular surface to this wound which is almost nodular in texture. It undermines superiorly with the same nodular hyper granulated surface. I see that he was referred to general surgery for an operative debridement although the surgeon did not agree with this I  think that would have been the proper course of action in this. He has been using Hydrofera Blue. He is supposed to start a wound VAC tomorrow which is actually a reapplication of wound VAC. I think mostly last time they had trouble keeping the seal in place I hope we have better look at this this time. 05/24/2019 on evaluation today I did review patient's note from Dr. Dellia Nims where he was seen last week when I was out of the office. Subsequently it does appear that Dr. Dellia Nims was in agreement that the patient really needed debridement to clear away some of the nodular tissue in the base of the wound. The good news is he has the wound VAC initiated and some of this tissue is already starting to breakdown under the treatment of the wound VAC again because it is not really viable. PISTOL, GRZESKOWIAK ([id number]) Nonetheless I do believe that we can  likely perform some debridement today to clear this away and hopefully the continuation of the wound VAC along with hyperbarics and antibiotic therapy will be beneficial in stopping this from reoccurring. If we get better control in the wound bed in a good spot I think we can definitely use the PriMatrix which we actually did get approval for. 05/31/2019 on evaluation today patient appears to be doing actually pretty well at this point with regard to his wound. He has been tolerating the dressing changes which is good news. Fortunately there is no signs of infection. He still has some of the hyper granular nodules noted we have not really cleared all these away and I think we can try to do a little bit more that today. I did do quite a bit last week I am hoping to get down to the base of the wound so though actually be able to heal more appropriately. I think the wound VAC is doing a good job right now with controlling the drainage and overall the patient is happy with that. 06/07/2019 upon evaluation today patient appears to be doing quite well compared  to last week with regard to the hyper granulation in the base of the wound. He has been tolerating the dressing changes without complication. Fortunately there is no signs of active infection at this time. With that being said we have not had any recent measures as far as blood work is concerned I think we may want to check a few things including a CBC, sed rate, and C-reactive protein. Patient is in agreement with that we can try to get that scheduled for him for this Friday. 06/13/2019 on evaluation today patient actually appears to be doing much better at this point based on what I am seeing with regard to his wound. He has much less in the way of hyper granular tissue which is good news and a lot more healing that is taken place since I last saw him as far as the amount of space within the wound itself. With that being said he is nearing the completion of the initial 40 treatments for hyperbaric oxygen therapy I think this is something I would recommend extending as he does seem to be benefiting at this time. Fortunately there is no evidence of active systemic infection at this point nor even local infection for that matter. 06/20/2019 upon evaluation today patient actually appears to be doing well with regard to his wound. In fact this appears to be doing much better today compared to what it was previous. I been extremely pleased with the progress that he is made. In fact the tunnel is much less deep today than what it was in the past and I am seeing excellent signs of improvement. Overall I am happy with how things are going. I did review his white blood cell count which revealed everything to be okay. The one abnormal finding on his CBC was a hemoglobin of 12.7. Subsequently I did also review his sed rate and C-reactive protein. His sed rate measured in at normal level measuring at 26. The C-reactive protein however was elevated today and an abnormal range indicating inflammation. Still I  feel like he is trending in a good direction at this time. He is going to need a refill of the Levaquin he tells me at this point. 07/04/2019 on evaluation today patient actually appears to be doing well in regard to his wound. He has been tolerating the dressing changes without complication. Fortunately  there is no signs of active infection which is good news. No fevers, chills, nausea, vomiting, or diarrhea. The good news also is that I did review his labs and though his sed rate was slightly elevated the C-reactive protein was actually down compared to the last evaluation 2 weeks ago this is excellent news I think it is showing signs of improvement. 07/11/2019 on evaluation today patient actually appears to be doing quite well with regard to his wound on my evaluation. This is very slow going but nonetheless he seems to be making progress. Especially in regard to the depth. Nonetheless I believe that we may want to consider going forward with the PriMatrix which was previously approved for him. We will get a have to get a new approval as we will actually be applying this in the new year so we will likely obtain that approval on January 4. In the meantime and for the time being my suggestion is going to be that we go ahead and continue with the wound VAC and we were going at this point with the current wound care measures. Electronic Signature(s) Signed: 07/11/2019 6:01:55 PM By: Worthy Keeler [name] Entered By: Worthy Keeler on 07/11/2019 18:01:54 FINCH, PFUHL ([id number]) -------------------------------------------------------------------------------- Physical Exam Details Patient Name: [name] Date of Service: 07/11/2019 10:00 AM Medical Record Number: [id number] Patient Account Number: [id number] Date of Birth/Sex: 02/01/1951 (68 y.o. M) Treating RN: Harold Barban Primary Care Provider: [name] Other Clinician: Referring Provider: [name] Treating Provider/Extender: [name], Erroll Wilbourne Weeks in Treatment: 49 Constitutional Well-nourished and well-hydrated in no acute distress. Respiratory normal breathing without difficulty. Psychiatric this patient is able to make decisions and demonstrates good insight into disease process. Alert and Oriented x 3. pleasant and cooperative. Notes Patient's wound bed actually showed signs of still a few hyper granular areas not nearly as bad as before but I feel like we are making some progress in this regard. Am going to perform some debridement today to clear away these hyper granular lesions which this was tolerated and performed today with minimal bleeding I was able to use chemical cauterization to stop the bleeding. Subsequently I am getting go ahead and continue with the wound VAC at this point though I do believe that a skin substitute may be in order. Electronic Signature(s) Signed: 07/11/2019 6:02:31 PM By: Worthy Keeler [name] Entered By: Worthy Keeler on 07/11/2019 18:02:31 NICOHLAS, STJULIEN ([id number]) -------------------------------------------------------------------------------- Physician Orders Details Patient Name: [name] Date of Service: 07/11/2019 10:00 AM Medical Record Number: [id number] Patient Account Number: [id number] Date of Birth/Sex: 04/11/1951 (68 y.o. 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 - in office o Cleanse wound with mild soap and water Skin Barriers/Peri-Wound Care o Skin Prep Primary Wound Dressing Wound #1 Right Gluteal fold o Silver Collagen - under NPWT o Other: - in clinic - plain alginate, gauze and tape Dressing Change Frequency Wound #1 Right Gluteal fold o Change Dressing Monday, Wednesday, Friday Follow-up Appointments Wound #1 Right Gluteal fold o  Return Appointment in 1 week. Off-Loading Wound #1 Right Gluteal fold o Turn and reposition every 2 hours Home Health Wound #1 Right Gluteal fold o [place] Nurse may visit PRN to address patientos wound care needs. o FACE TO FACE ENCOUNTER: MEDICARE and MEDICAID PATIENTS: I certify that this patient is under my care and that I had a face-to-face encounter that meets the physician face-to-face encounter requirements with this patient on this date. The encounter with the patient was in whole or in part for the following [place] ([id number]) CONDITION: (primary reason for Home Healthcare) MEDICAL NECESSITY: I certify, that based on my findings, NURSING services are a medically necessary home health service. HOME BOUND STATUS: I certify that my clinical findings support that this patient is homebound (i.e., Due to illness or injury, pt requires aid of supportive devices such as crutches, cane, wheelchairs, walkers, the use of special transportation or the assistance of another person to leave their place of residence. There is a normal inability to leave the home and doing so requires considerable and taxing effort. Other absences are for medical reasons / religious services and are infrequent or of short duration when for other reasons). o If current dressing causes regression in wound condition, may D/C ordered dressing product/s and apply Normal Saline Moist Dressing daily until next [place] / Other MD appointment. [place] of regression in wound condition at [phone]. o Please direct any NON-WOUND related issues/requests for orders to patient's Primary Care Physician Negative Pressure Wound Therapy Wound #1 Right Gluteal fold o Wound VAC settings at 125/130 mmHg continuous pressure. Use BLACK/GREEN foam to wound cavity. Use WHITE foam to fill any tunnel/s and/or undermining. Change VAC dressing 3 X WEEK. Change canister as indicated when full. Nurse may titrate settings and frequency of dressing changes as clinically indicated. o Home Health Nurse may d/c VAC for s/s of increased infection, significant wound regression, or uncontrolled drainage. [place] at [phone]. o Apply contact layer over base of wound. - apply prisma/silver collagen to the base of the wound o Number of foam/gauze pieces used in the dressing = Hyperbaric Oxygen Therapy Wound #1 Right Gluteal fold o Evaluate for HBO Therapy o Indication: - Right ischial osteomyelitis o If appropriate for treatment, begin HBOT per protocol: o 2.0 ATA for 90 Minutes without Air Breaks o One treatment per day (delivered Monday through Friday unless otherwise specified in Special Instructions below): o Total # of Treatments: - 60 Laboratory o CBC W Auto Differential panel in Blood (HEM-CBC) - HHRN to draw CBC w/ Diff, Sed Rate, and CRP on Monday, December 28th. Please FAX results to [facility] [id number] oooo LOINC Code: [id number] oooo Convenience Name: CBC W Auto Differential panel Patient Medications Allergies: penicillin, doxycycline, clindamycin Notifications Medication Indication Start End Levaquin 07/11/2019 DOSE 1 - oral 500 mg tablet - 1 tablet oral daily for 2 weeks. do not take iron with this medication Electronic Signature(s) Signed: 07/11/2019 6:04:43 PM By: Worthy Keeler [name] Previous Signature: 07/11/2019 11:33:20 AM  Version By: Harold Barban Entered By: Worthy Keeler on 07/11/2019 18:04:42 CRESTON, CANUTO ([id number]) JOVONNIE, DESPIRITO ([id number]) -------------------------------------------------------------------------------- Problem List Details Patient Name: [name] Date of Service: 07/11/2019 10:00 AM Medical Record Number: [id number] Patient Account Number: [id number] Date of Birth/Sex: January 19, 1951 (68 y.o. 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, other site 04/08/2019 No Yes L03.317 Cellulitis of buttock 06/21/2018 No Yes G82.20 Paraplegia, unspecified 11/30/2017 No Yes S34.109S Unspecified injury to unspecified level of lumbar spinal cord, 11/30/2017 No Yes sequela I10 Essential (primary) hypertension 11/30/2017 No Yes Inactive Problems Resolved Problems Electronic Signature(s) Signed: 07/11/2019 10:06:04 AM By: Worthy Keeler [name] Entered By: Worthy Keeler on 07/11/2019 10:06:04 [name] ([id number]) -------------------------------------------------------------------------------- Progress Note Details Patient Name: [name] Date of Service: 07/11/2019 10:00 AM Medical Record Number: [id number] Patient Account Number: [id number] Date of Birth/Sex: 05-12-1951 (68 y.o. 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 He states that there is no history of diabetes that he is aware of. He does have issues with his prostate and is currently receiving treatment for this by way of oral medication. With that being said I do not have a lot of details in that regard. Nonetheless the patient presents today as a result of having been referred to [place] by another provider initially home health was set to come out and take care of his wound although due to the fact that he apparently drives he's not able to receive home health. His wife is therefore trying to help take care of this wound within although they have been struggling with what exactly to do at this point. She states that she can do some things but she is definitely not a nurse and does have some issues with looking at blood. The good news is the wound does not appear to be too deep and is fairly superficial at this point. There is no slough noted there is some nonviable skin noted around the surface of the wound and the perimeter at this point. The central portion of the wound appears to be very good with a dermal layer noted this does not appear to be again deep enough to extend it to subcutaneous tissue at this point. 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 In the pump he has Dilaudid and baclofen. 12/07/17 on evaluation today patient presents for follow-up concerning his ongoing lower back thigh ulcer on the right. He states that he did not get the supplies ordered and therefore has not really been able to perform the dressing changes as directed exactly. His wife was able to get some Boarder Foam Dressing's from the  drugstore and subsequently has been using hydrogel which did help to a degree in the wound does appear to be able smaller. There is actually more drainage this week noted than previous. 12/21/17 on evaluation today patient appears to be doing rather well in regard to his right gluteal ulcer. He has been tolerating the dressing changes without complication. There does not appear to be any evidence of infection at this point in time.  Overall the wound does seem to be making some progress as far as the edges are concerned there's not as much in the way of overlapping of the external wound edges and he has a good epithelium to wound bed border for the most part. This however is not true right at the 12 o'clock location over the span of a little over a centimeters which actually will require debridement today to clean this away and hopefully allow it to continue to heal more appropriately. 12/28/17 on evaluation today patient appears to be doing rather well in regard to his ulcer in the left gluteal region. He's been tolerating the dressing changes without complication. Apparently he has had some difficulty getting his dressing material. Apparently there's been some confusion with ordering we're gonna check into this. Nonetheless overall he's been showing signs of improvement which is good news. Debridement is not required today. 01/04/18 on evaluation today patient presents for follow-up concerning his right gluteal ulcer. He has been tolerating the dressing changes fairly well. On inspection today it appears he may actually have some maceration them concerned about the fact that he may be developing too much moisture in and around the wound bed which can cause delay in healing. With that being said he unfortunately really has not showed significant signs of improvement since last week's evaluation in fact this may even be just the little bit/slightly larger. Nonetheless he's been having a lot of discomfort  I'm not sure this is even related to the wound as he has no pain when I'm to breeding or otherwise cleaning the wound during evaluation today. Nonetheless this is something that we did recommend he talked to his pain specialist concerning. 01/11/18 on evaluation today patient appears to be doing better in regard to his ulceration. He has been tolerating the dressing [name], Jayen J. ([id number]) changes without complication. With that being said overall there's no evidence of infection which is good news. The only thing is he did receive the hatch affair blue classic versus the ready nonetheless I feel like this is perfectly fine and appears to have done well for him over the past week. 01/25/18 on evaluation today patient's wound actually appears to be a little bit larger than during the last evaluation. The good news is the majority of the wound edges actually appear to be fairly firmly attached to the wound bed unfortunately again we're not really making progress in regard to the size. Roughly the wound is about the same size as when I first saw him although again the wound margin/edges appear to be much better. 02/01/18 on evaluation today patient actually appears to be doing very well in regard to his wound. Applying the Prisma dry does seem to be better although he does still have issues with slow progression of the wound. There was a slight improvement compared to last week's measurements today. Nonetheless I have been considering other options as far as the possibility of Theraskin or even a snap vac. In general I'm not sure that the Theraskin due to location of the wound would be a very good idea. Nonetheless I do think that a snap vac could be a possibility for the patient and in fact I think this could even be an excellent way to manage the wound possibly seeing some improvement in a very rapid fashion here. Nonetheless this is something that we would need to get approved and I did have a  lengthy conversation with the patient about this today. 02/08/18 on  evaluation today patient appears to be doing a little better in regard to his ulcer. He has been tolerating the dressing changes without complication. Fortunately despite the fact that the wound is a little bit smaller it's not significantly so unfortunately. We have discussed the possibility of a snap vac we did check with insurance this is actually covered at this point. Fortunately there does not appear to be any sign of infection. Overall I'm fairly pleased with how things seem to be appearing at this point. 02/15/18 on evaluation today patient appears to be doing rather well in regard to his right gluteal ulcer. Unfortunately the snap vac did not stay in place with his sheer and friction this came loose and did not seem to maintain seal very well. He worked for about two days and it did seem to do very well during that time according to his wife but in general this does not seem to be something that's gonna be beneficial for him long-term. I do believe we need to go back to standard dressings to see if we can find something that will be of benefit. 03/02/18- He is here in follow up evaluation; there is minimal change in the wound. He will continue with the same treatment plan, would consider changing to iodosrob/iodoflex if ulcer continues to to plateau. He will follow up next week 03/08/18 on evaluation today patient's wound actually appears to be about the same size as when I previously saw him several weeks back. Unfortunately he does have some slightly dark discoloration in the central portion of the wound which has me concerned about pressure injury. I do believe he may be sitting for too long a period of time in fact he tells me that "I probably sit for much too long". He does have some Slough noted on the surface of the wound and again as far as the size of the wound is concerned I'm really not seeing anything that seems to  have improved significantly. 03/15/18 on evaluation today patient appears to be doing fairly well in regard to his ulcer. The wound measured pretty much about the same today compared to last week's evaluation when looking at his graph. With that being said the area of bruising/deep tissue injury that was noted last week I do not see at this point. He did get a new cushion fortunately this does seem to be have been of benefit in my pinion. It does appear that he's been off of this more which is good news as well I think that is definitely showing in the overall wound measurements. With that being said I do believe that he needs to continue to offload I don't think that the fact this is doing better should be or is going to allow him to not have to offload and explain this to him as well. Overall he seems to be in agreement the plan I think he understands. The overall appearance of the wound bed is improved compared to last week I think the Iodoflex has been beneficial in that regard. 03/29/18 on evaluation today patient actually appears to be doing rather well in regard to his wound from the overall appearance standpoint he does have some granulation although there's some Slough on the surface of the wound noted as well. With that being said he unfortunately has not improved in regard to the overall measurement of the wound in volume or in size. I did have a discussion with him very specifically about offloading today. He actually  does work although he mainly is just sitting throughout the day. He tells me he offloads by "lifting himself up for 30 seconds off of his chair occasionally" purchase from [employer] which does seem to have helped. And he has a new cushion that he with that being said he's also able to stand some for a very short period of time but not significant enough I think to provide appropriate offloading. I think the biggest issue at this point with the wound and the fact is not  healing as quickly as we would like is due to the fact that he is really not able to appropriately offload while at work. He states the beginning after his injury he actually had a bed at his job that he could lay on in order to offload and that does seem to have been of help back at that time. Nonetheless he had not done this in quite some time unfortunately. I think that could be helpful for him this is something I would like for him to look into. [name], [name] ([id number]) 04/05/18 on evaluation today patient actually presents for follow-up concerning his right gluteal ulcer. Again he really is not significantly improved even compared to last week. He has been tolerating the dressing changes without complication. With that being said fortunately there appears to be no evidence of infection at this time. He has been more proactive in trying to offload. 04/12/18 on evaluation today patient actually appears to be doing a little better in regard to his wound and the right gluteal fold region. He's been tolerating the dressing changes since removing the oasis without complication. However he was having a lot of burning initially with the oasis in place. He's unsure of exactly why this was given so much discomfort but he assumes that it was the oasis itself causing the problem. Nonetheless this had to be removed after about three days in place although even those three days seem to have made a fairly good improvement in regard to the overall appearance of the wound bed. In fact is the first time that he's made any improvement from the standpoint of measurements in about six weeks. He continues to have no discomfort over the area of the wound itself which leads me to wonder why he was having the burning with the oasis when he does not even feel the actual debridement's themselves. I am somewhat perplexed by this. 04/19/18 on evaluation today patient's wound actually appears to be showing signs of  epithelialization around the edge of the wound and in general actually appears to be doing better which is good news. He did have the same burning after about three days with applying the Endoform last week in the same fashion that I would generally apply a skin substitute. This seems to indicate that it's not the oasis to cause the problem but potentially the moisture buildup that just causes things to burn or there may be some other reaction with the skin prep or Steri-Strips. Nonetheless I'm not sure that is gonna be able to tolerate any skin substitute for a long period of time. The good news is the wound actually appears to be doing better today compared to last week and does seem to finally be making some progress. 04/26/18 on evaluation today patient actually appears to be doing rather well in regard to his ulcer in the right gluteal fold. He has been tolerating the dressing changes without complication which is good news. The Endoform does seem  to be helping although he was a little bit more macerated this week. This seems to be an ongoing issue with fluid control at this point. Nonetheless I think we may be able to add something like Drawtex to help control the drainage. 05/03/18 on evaluation today patient appears to actually be doing better in regard to the overall appearance of his wound. He has been tolerating the dressing changes without complication. Fortunately there appears to be no evidence of infection at this time. I really feel like his wound has shown signs as of today of turning around last week I thought so as well and definitely he could be seen in this week's overall appearance and measurements. In general I'm very pleased with the fact that he finally seems to be making a steady but sure progress. The patient likewise is very pleased. 05/17/18 on evaluation today patient appears to be doing more poorly unfortunately in regard to his ulcer. He has been tolerating the  dressing changes without complication. With that being said he tells me that in the past couple of days he and his wife have noticed that we did not seem to be doing quite as well is getting dark near the center. Subsequently upon evaluation today the wound actually does appear to be doing worse compared to previous. He has been tolerating the dressing changes otherwise and he states that he is not been sitting up anymore than he was in the past from what he tells me. Still he has continued to work he states "I'm tired of dealing with this and if I have to just go home and lay in the bed all the time that's what I'll do". Nonetheless I am concerned about the fact that this wound does appear to be deeper than what it was previous. 05/24/18 upon evaluation today patient actually presents after having been in the hospital due to what was presumed to be sepsis secondary to the wound infection. He had an elevated white blood cell count between 14 and 15. With that being said he does seem to be doing somewhat better now. His wound still is giving him some trouble nonetheless and he is obviously concerned about the fact likely talked about that this does seem to go more deeply than previously noted. I did review his wound culture which showed evidence of Staphylococcus aureus him and group B strep. Nonetheless he is on antibiotics, Levaquin, for this. Subsequently I did review his intake summary from the hospital as well. I also did look at the CT of the lumbar spine with contrast that was performed which showed no bone destruction to suggest lumbar disguises/osteomyelitis or sacral osteomyelitis. There was no paraspinal abscess. Nonetheless it appears this may have been more of just a soft tissue infection at this point which is good news. He still is nonetheless concerned about the wound which again I think is completely reasonable considering everything he's been through recently. 05/31/18 on evaluation  today on evaluation today patient actually appears to be showing signs of his wound be a little bit deeper than what I would like to see. Fortunately he does not show any signs of significant infection although his temperature was 99 today he states he's been checking this at home and has not been elevated. Nonetheless with the undermining that I'm seeing at this point I am becoming more concerned about the wound I do think that offloading is a key factor here that is preventing the speedy recovery at this point. There  does not appear to be any evidence of again over infection noted. He's been using Santyl currently. JAMONIE, MENSE ([id number]) 06/07/18 the patient presents today for follow-up evaluation regarding the left ulcer in the gluteal region. He has been tolerating the Wound VAC fairly well. He is obviously very frustrated with this he states that to mean is really getting in his way. There does not appear to be any evidence of infection at this time he does have a little bit of odor I do not necessarily associate this with infection just something that we sometimes notice with Wound VAC therapy. With that being said I can definitely catch a tone of discontentment overall in the patient's demeanor today. This when he was previously in the hospital an CT scan was done of the lumbar region which did not reveal any signs of osteomyelitis. With that being said the pelvis in particular was not evaluated distinctly which means he could still have some osteonecrosis I. Nonetheless the Wound VAC was started on Thursday I do want to get this little bit more time before jumping to a CT scan of the pelvis although that is something that I might would recommend if were not see an improvement by that time. 06/14/18 on evaluation today patient actually appears to be doing about the same in regard to his right gluteal ulcer. Again he did have a CT scan of the lumbar spine unfortunately this did not  include the pelvis. Nonetheless with the depth of the wound that I'm seeing today even despite the fact that I'm not seeing any evidence of overt cellulitis I believe there's a good chance that we may be dealing with osteomyelitis somewhere in the right Ischial region. No fevers, chills, nausea, or vomiting noted at this time. 06/21/18 on evaluation today patient actually appears to be doing about the same with regard to his wound. The tunnel at 6 o'clock really does not appear to be any deeper although it is a little bit wider. I think at this point you may want to start packing this with white phone. Unfortunately I have not got approval for the CT scan of the pelvis as of yet due to the fact that Medicare apparently has been denied it due to the diagnosis codes not being appropriate according to Medicare for the test requested. With that being said the patient cannot have an MRI and therefore this is the only option that we have as far as testing is concerned. The patient has had infection and was on antibiotics and been added code for cellulitis of the bottom to see if this will be appropriate for getting the test approved. Nonetheless I'm concerned about the infection have been spread deeper into the Ischial region. 06/28/18 on evaluation today patient actually appears to be doing rather well all things considered in regard to the right gluteal ulcer. He has been tolerating the dressing changes without complication. With that being said the Wound VAC he states does have to be replaced almost every day or at least reinforced unfortunately. Patient actually has his CT scan later this morning we should have the results by tomorrow. 07/05/18 on evaluation today patient presents for follow-up concerning his right Ischial ulcer. He did see the surgeon Dr. Lysle Pearl last week. They were actually very happy with him and felt like he spent a tremendous amount of time with them as far as discussing his  situation was concerned. In the end Dr. Lysle Pearl did contact me as well and determine  that he would not recommend any surgical intervention at this point as he felt like it would not be in the patient's best interest based on what he was seeing. He recommended a referral to infectious disease. Subsequently this is something that Dr. Ines Bloomer office is working on setting up for the patient. As far as evaluation today is concerned the patient's wound actually appears to be worse at this point. I am concerned about how things are progressing and specifically about infection. I do not feel like it's the deeper but the area of depth is definitely widening which does have me concerned. No fevers, chills, nausea, or vomiting noted at this time. I think that we do need initiate antibiotic therapy the patient has an allow allergy to amoxicillin/penicillin he states that he gets a rash since childhood. Nonetheless she's never had the issues with Catholics or cephalosporins in general but he is aware of. 07/27/18 on evaluation today patient presents following admission to the hospital on 07/09/18. He was subsequently discharged on 07/20/18. On 07/15/18 the patient underwent irrigation and debridement was soft tissue biopsy and bone biopsy as well as placement of a Wound VAC in the OR by Dr. Celine Ahr. During the hospital course the patient was placed on a Wound VAC and recommended follow up with surgery in three weeks actually with Dr. Delaine Lame who is infectious disease. The patient was on vancomycin during the hospital course. 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 He also had a bone culture which revealed evidence of methicillin-resistant staph aureus. He is updated CT scan 07/09/18 reveals that he had progression of the which was performed on wound to breakdown down to the trochanter where he actually had irregularities there as well suggestive of osteomyelitis. This was a  change just since 9 December when we last performed a CT scan. Obviously this one had gone downhill quite significantly and rapidly. At this point upon evaluation I feel like in general the patient's wound seems to be doing fairly well all things considered upon my evaluation today. Obviously this is larger and deeper than what I previously evaluated but at the same time he seems to be making some progress as far as the appearance of the granulation tissue is concerned. I'm happy in that regard. No fevers, chills, nausea, or vomiting noted at this time. He is on IV vancomycin and Rocephin at the facility. He is currently in [facility]. 08/03/18 upon evaluation today patient's wound appears to be doing better in regard to the overall appearance at this point in time. Fortunately he's been tolerating the Wound VAC without complication and states that the facility has been taking [name], [name]. ([id number]) excellent care of the wound site. Overall I see some Slough noted on the surface which I am going to attempt sharp debridement today of but nonetheless other than this I feel like he's making progress. 08/09/18 on evaluation today patient's wound appears to be doing much better compared to even last week's evaluation. Do believe that the Wound VAC is been of great benefit for him. He has been tolerating the dressing changes that is the Wound VAC without any complication and he has excellent granulation noted currently. There is no need for sharp debridement at this point. 08/16/18 on evaluation today patient actually appears to be doing very well in regard to the wound in the right gluteal fold region. This is showing signs of progress and again appears to be very healthy which is  excellent news. Fortunately there is no sign of active infection by way of odor or drainage at this point. Overall I'm very pleased with how things stand. He seems to be tolerating the Wound VAC without  complication. 08/23/18 on evaluation today patient actually appears to be doing better in regard to his wound. He has been tolerating the Wound VAC without complication and in fact it has been collecting a significant amount of drainage which I think is good news especially considering how the wound appears. Fortunately there is no signs of infection at this time definitely nothing appears to be worse which is good news. He has not been started on the Bactrim and Flagyl that was recommended by Dr. Delaine Lame yet. I did actually contact her office this morning in order to check and see were things are that regard their gonna be calling me back. 08/30/18 on evaluation today patient actually appears to show signs of excellent improvement today compared to last evaluation. The undermining is getting much better the wound seems to be feeling quite nicely and I'm very pleased that the granulation in general. With that being said overall I feel like the patient has made excellent progress which is great news. No fevers, chills, nausea, or vomiting noted at this time. 09/06/18 on evaluation today patient actually appears to be doing rather well in regard to his right gluteal ulcer. This is showing signs of improvement in overall I'm very pleased with how things seem to be progressing. The patient likewise is please. Overall I see no evidence of infection he is about to complete his oral antibiotic regimen which is the end of the antibiotics for him in just about three days. 09/13/18 on evaluation today patient's right Ischial ulcer appears to be showing signs of continued improvement which is excellent news. He's been tolerating the dressing changes without complication. Fortunately there's no signs of infection and the wound that seems to be doing very well. 09/28/18 on evaluation today patient appears to be doing rather well in regard to his right Ischial ulcer. He's been tolerating the Wound VAC without  complication he knows there's much less drainage than there used to be this obviously is not a bad thing in my pinion. There's no evidence of infection despite the fact is but nothing about it now for several weeks. 10/04/18 on evaluation today patient appears to be doing better in regard to his right Ischial wound. He has been tolerating the Wound VAC without complication and I do believe that the silver nitrate last week was beneficial for him. Fortunately overall there's no evidence of active infection at this time which is great news. No fevers, chills, nausea, or vomiting noted at this time. 10/11/18 on evaluation today patient actually appears to be doing rather well in regard to his Ischial ulcer. He's been tolerating the Wound VAC still without complication I feel like this is doing a good job. No fevers, chills, nausea, or vomiting noted at this time. 11/01/18 on evaluation today patient presents after having not been seen in our clinic for several weeks secondary to the fact that he was on evaluation today patient presents after having not been seen in our clinic for several weeks secondary to the fact that he was in a skilled nursing facility which was on lockdown currently due to the covert 19 national emergency. Subsequently he was discharged from the facility on this past Friday and subsequently made an appointment to come in to see yesterday. Fortunately there's no signs  of active infection at this time which is good news and overall he does seem to have made progress since I last saw. Overall I feel like things are progressing quite nicely. The patient is having no pain. 11/08/18 on evaluation today patient appears to be doing okay in regard to his right gluteal ulcer. He has been utilizing a Wound VAC home health this changing this at this point since he's home from the skilled nursing facility. Fortunately there's no signs of obvious active infection at this time. Unfortunately though  there's no obvious active infection he is having some maceration and his wife states that when the sheets of the Wound VAC office on Sunday when it broke seal that he ended up having significant issues with some smell as well there concerned about the possibility of infection. Fortunately there's No fevers, chills, nausea, or vomiting noted at this time. TEON, BACHELLER ([id number]) 11/15/18 on evaluation today patient actually appears to be doing well in regard to his right gluteal ulcer. He has been tolerating the dressing changes without complication. Specifically the Wound VAC has been utilized up to this point. Fortunately there's no signs of infection and overall I feel like he has made progress even since last week when I last saw him. I'm actually fairly happy with the overall appearance although he does seem to have somewhat of a hyper granular overgrowth in the central portion of the wound which I think may require some sharp debridement to try flatness out possibly utilizing chemical cauterization following. 11/23/18 on evaluation today patient actually appears to be doing very well in regard to his sacral ulcer. He seems to be showing signs of improvement with good granulation. With that being said he still has the small area of hyper granulation right in the central portion of the wound which I'm gonna likely utilize silver nitrate on today. Subsequently he also keeps having a leak at the 6 o'clock location which is unfortunate we may be able to help out with some suggestions to try to prevent this going forward. Fortunately there's no signs of active infection at this time. 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 He's been tolerating the dressing changes without complication. Fortunately there's no signs of active infection at this time. I've been rather pleased with how things have  progressed there still some evidence of pressure getting to the area with some redness right around the immediate wound opening. Nonetheless other than this I'm not seeing any significant complications or issues the wound is somewhat hyper granular. Upon discussing with the patient and his wife today I'm not sure that the wound is being packed to the base with the foam at this point. And if it's not been packed fully that may be part of the reason why is not seen as much improvement as far as the granulation from the base out. Again we do not want pack too tightly but we need some of the firm to get to the base of the wound. I discussed this with patient and his wife today. 12/06/18 on evaluation today patient appears to be doing well in regard to his right gluteal pressure ulcer. He's been tolerating the dressing changes without complication. Fortunately there's no signs of active infection. He still has some hyper granular tissue and I do think it would be appropriate to continue with the chemical cauterization as of today. 12/16/18 on  evaluation today patient actually appears to be doing okay in regard to his right gluteal ulcer. He is been tolerating the dressing changes without complication including the Wound VAC. Overall I feel like nothing seems to be worsening I do feel like that the hyper granulation buds in the central portion of the wound have improved to some degree with the silver nitrate. We will have to see how things continue to progress. 12/20/18 on evaluation today patient actually appears to be doing much worse in my pinion even compared to last week's evaluation. Unfortunately as opposed to showing any signs of improvement the areas of hyper granular tissue in the central portion of the wound seem to be getting worse. Subsequently the wound bed itself also seems to be getting deeper even compared to last week which is both unusual as well as concerning since prior he had been shown  signs of improvement. Nonetheless I think that the issue could be that he's actually having some difficulty in issues with a deeper infection. There's no external signs of infection but nonetheless I am more worried about the internal, osteomyelitis, that could be restarting. He has not been on antibiotics for some time at this point. I think that it may be a good idea to go ahead and started back on an antibiotic therapy while we wait to see what the testing shows. 12/27/18 on evaluation today patient presents for follow-up concerning his left gluteal fold wound. Fortunately he appears to be doing well today. I did review the CT scan which was negative for any signs of osteomyelitis or acute abnormality this is excellent news. Overall I feel like the surface of the wound bed appears to be doing significantly better today compared to previously noted findings. There does not appear any signs of infection nor does he have any pain at this time. 01/03/19 on evaluation today patient actually appears to be doing quite well in regard to his ulcer. Post debridement last week he really did not have too much bleeding which is good news. Fortunately today this seems to be doing some better but we still has some of the hyper granular tissue noted in the base of the wound which is gonna require sharp debridement today as well. Overall I'm pleased with how things seem to be progressing since we switched away from the Wound VAC I think he is making some progress. 01/10/19 on evaluation today patient appears to be doing better in regard to his right gluteal fold ulcer. He has been tolerating the dressing changes without complication. The debridement to seem to be helping with current away some of the poor hyper granular tissue bugs throughout the region of his gluteal fold wound. He's been tolerating the dressing changes otherwise without complication which is great news. No fevers, chills, nausea, or vomiting noted  at this time. 01/17/19 on evaluation today patient actually appears to be doing excellent in regard to his wound. He's been tolerating the dressing changes without complication. Fortunately there is no signs of active infection at this time which is great news. No fevers, chills, nausea, or vomiting noted at this time. IZRAEL, DRAGOS ([id number]) 01/24/19 on evaluation today patient actually appears to be doing quite well with regard to his ulcer. He has been tolerating the dressing changes without complication. Fortunately there's no signs of active infection at this time. Overall been very pleased with the progress that he seems to be making currently. 01/31/19-Patient returns at 1 week with apparent similarity in dimensions  to the wound, with no signs of infection, he has been changing dressings twice a day 02/08/19 upon evaluation today patient actually appears to be doing well with regard to his right Ischial ulcer. The wound is not appear to be quite as deep and seems to be making progress which is good news. With that being said I'm still reluctant to go back to the Wound VAC at this point. He's been having to change the dressings twice a day which is a little bit much in my pinion from the wound care supplies standpoint. I think that possibly attempting to utilize extras orbit may be beneficial this may also help to prevent any additional breakdown secondary to fluid retention in the wound itself. The patient is in agreement with giving this a try. 02/15/19 on evaluation today patient actually appears to be doing decently well with regard to his ulcer in the right to gluteal fold location. He's been tolerating the dressing changes without complication. Fortunately there is no signs of active infection at this time. He is able to keep the current dressing in place more effectively for a day at a time whereas before he was having a changes to to three times a day. The actions or has been  helpful in this regard. Fortunately there's no signs of anything getting worse and I do feel like he showing signs of good improvement with regard to the wound bed status. 02/22/2019 patient appears to be doing very well today with regard to his ulcer in the gluteal fold. Fortunately there is no signs of active infection and he has been tolerating the dressing changes without any complication. Overall extremely pleased with how things seem to be progressing. He has much less of the hyper granular projections within the wound these have slowly been debrided away and he seems to be doing well. The wound bed is more uniform. 03/01/19 on evaluation today patient appears to be doing unfortunately about the same in regard to his gluteal ulcer. He's been tolerating the dressing changes without complication. Fortunately there's no signs of active infection at this time. With that being said he continues to develop these hyper granular projections which I'm unsure of exactly what they are and why they are rising. Nonetheless I explained to the patient that I do believe it would be a good idea for [place] to stand a biopsy sample for pathology to see if that can shed any light on what exactly may be going on here. Fortunately I do not see any obvious signs of infection. With that being said the patient has had a little bit more drainage this week apparently compared to last week. 03/08/2019 on evaluation today patient actually appears to look somewhat better with regard to the appearance of his wound bed at this time. This is good news. Overall I am very pleased with how things seem to have progressed just in the past week with a switch to the [facility] dressing. I think that has been beneficial for him. With that being said at this time the patient is concerned about his biopsy that I sent off last week unfortunately I do not have that report as of yet. Nonetheless we have called to obtain this and hopefully will  hear back from the lab later this morning. 03/15/19 on evaluation today patient's wound actually appears to be doing okay today with regard to the overall appearance of the wound bed. He has been tolerating the dressing changes without complication he still has  hyper granular tissue noted but fortunately that seems to be minimal at this point compared to some of what we've seen in the past. Nonetheless I do think that he is still having some issues currently with some of this type of granulation the biopsy and since all showed nothing more than just evidence of granulation tissue. Therefore there really is nothing different to initiate or do at this point. 03/24/19 on evaluation today patient appears to be doing a little better with regard to his ulcer. He's been tolerating the dressing changes without complication. Fortunately there is no signs of active infection at this time. No fevers, chills, nausea, or vomiting noted at this time. I'm overall pleased with how things seem to be progressing. 03/29/2019 on evaluation today patient appears to be doing about the same in regard to his ulcer in the right gluteal fold. Unfortunately he is not seeming to make a lot of progress and the wound is somewhat stalled. There is no signs of active infection externally but I am concerned about the possibility of infection continuing in the ischial location which previously he did have infection noted. Again is not able to have an MRI so probably our best option for testing for this would be a triple phase bone scan which will detect subtle changes in the bone more so than plain film x-rays. In the past they really have not been beneficial and in fact the CT scans even have been somewhat questionable at times. Nonetheless there is no signs of systemic infection which is at least good news but again his wound is not healing at all on the predicted schedule. 04/05/19 on evaluation today patient appears to be doing well  all things considering with regard to his wound and the right gluteal fold. He actually has his triple bone scan scheduled for sometime in the next couple of days. With that being said I've also been looking to other possibilities of what could be causing hyper granular tissue were looking into the bone scan again for evaluating for the risk or possibility of infection deeper and I'm also gonna go ahead and see about obtaining a deep tissue Crume, Berwyn J. ([id number]) culture today to send and see if there's any evidence of infection noted on culture. He's in agreement with that plan. 04/12/2019 on evaluation today patient presents for reevaluation here in our clinic concerning ongoing issues with his right gluteal fold ulcer. 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 It was discussed with him at that point that I think it would be appropriate for him to proceed with hyperbaric oxygen therapy especially in light of the fact that he is previously been on IV antibiotics at the beginning of the year for close to 3 months followed by several weeks of oral antibiotics that was all prescribed by infectious disease. He had surgical debridement around Christmas December 2019 due to an abscess and osteomyelitis of the ischial bone. Unfortunately this has not really proceeded as well as we would have liked and again we did a CT scan even a couple months ago as he cannot have an MRI secondary to having issues with both a pain pump as well as a spinal cord stimulator which prevent him from going into an MRI machine. With that being said there were chronic changes noted at the ischial tuberosity which had progressed since December 2019 there was  no evidence of fluid collection on that initial CT scan. With that being said that was on January 21, 2019. I am not sure that it was a sensitive  enough test as compared to an MRI and then subsequently I ordered a bone scan for him which was actually completed on 03/29/2019 and this revealed that he does indeed have positive osteomyelitis involving the right ischial tuberosity. This is adjacent to the ulcer and I think is the reason that his ulcer is not healing. Subsequently I am in a place him back on oral antibiotics today unfortunately his wound culture showed just mixed gram-negative flora with no specific findings of a predominant organism. Nonetheless being with the location I think a good broad-spectrum antibiotic for gram-negative's is a good choice at this point he is previously taken Levaquin without significant issues and I think that is an appropriate antibiotic for him at this time. 04/19/2019 on evaluation today patient actually appears to be doing fairly well with regard to his wound. He has been tolerating the dressing changes without complication. Fortunately there is no signs of active infection and he has been taking the oral antibiotics at this time. Subsequently we did make a referral to infectious disease although Dr. Steva Ready wants the patient to be seen by general surgery first in order apparently to see if there is anything from a surgical standpoint that should be done prior to initiation of IV antibiotic therapy. Again the patient is okay with actually seeing Dr. Celine Ahr whom he has seen before we will make that referral. 04/26/2019 on evaluation today patient actually appears to be doing well with regard to his wound. He has been tolerating the dressing changes without complication. Fortunately there is no signs of active infection at this time. I do believe that the hyperbaric oxygen therapy along with the antibiotics which I prescribed at this point have been doing well for him. With that being said he has not seen Dr. Celine Ahr the surgeon yet in fact they have not been contacted for scheduling an appointment as of  yet. Subsequently the patient is not on antibiotics currently by IV Dr. Steva Ready did want him to see Dr. Celine Ahr first which we are working on trying to get scheduled. We again give the information to the patient today for Dr. Celine Ahr and her number as far as contacting their office to see about getting something scheduled. Again were looking for whether or not they recommend any surgical intervention. 05/03/2019 on evaluation today patient appears to be doing about the same with regard to his wound there may be a little bit of filling in in regard to the base of the wound but still he has quite a bit of hyper granular tissue that I am seeing today. I believe that he may may need a more aggressive sharp debridement possibly even by the surgeon or under anesthesia in order to clear away some of the hyper granular material in order to help allow for appropriate granulation to fill in. We have made a referral to Dr. Celine Ahr unfortunately it sounds as if they did not receive the referral we contacted them today they should be get in touch with the patient. Depending on how things show from that standpoint the patient may need to see Dr. Ardyth Gal who is the infectious disease specialist although he really does not want a PICC line again. No fevers, chills, nausea, vomiting, or diarrhea. He is tolerating hyperbaric oxygen therapy very well. 05/12/2019 on evaluation today patient presents  for follow-up visit concerning his right gluteal fold ulcer. He did see Dr. Celine Ahr the surgeon who previously evaluated his wound and she actually felt like he was doing quite well with regard to the wound based on what she was seen. He does seem to be responding to some degree with the oral antibiotics along with hyperbaric oxygen therapy at this point she did not see any evidence or need for further surgical intervention at this point. She recommended deferring additional or ongoing antibiotic therapy to Dr. Steva Ready  at her discretion. Fortunately there is no signs of active systemic infection. No fevers, chills, nausea, vomiting, or diarrhea. 10/29; patient I am seeing really for the first time today in terms of his wound. 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 He is being treated with hyperbaric oxygen for underlying osteomyelitis most recently documented by a three-phase bone scan. He has been on Levaquin for roughly a month and he is out of these and asked me for a refill. Most recent cultures were negative although I do not think these were bone cultures. He has a very irregular surface to this wound which is almost nodular in texture. It undermines superiorly with the same nodular hyper granulated surface. I see that he was referred to general surgery for an operative debridement although the surgeon did not agree with this I think that would have been the proper course of action in this. He has been using Hydrofera Blue. He is supposed to start a wound VAC tomorrow which is actually a reapplication of wound VAC. I think mostly last time they had trouble keeping the seal in place I hope we have better look at this this time. ELIAJAH, BRENAN ([id number]) 05/24/2019 on evaluation today I did review patient's note from Dr. Dellia Nims where he was seen last week when I was out of the office. Subsequently it does appear that Dr. Dellia Nims was in agreement that the patient really needed debridement to clear away some of the nodular tissue in the base of the wound. The good news is he has the wound VAC initiated and some of this tissue is already starting to breakdown under the treatment of the wound VAC again because it is not really viable. Nonetheless I do believe that we can likely perform some debridement today to clear this away and hopefully the continuation of the wound VAC along with hyperbarics and antibiotic therapy will be beneficial in stopping this from reoccurring. If we  get better control in the wound bed in a good spot I think we can definitely use the PriMatrix which we actually did get approval for. 05/31/2019 on evaluation today patient appears to be doing actually pretty well at this point with regard to his wound. He has been tolerating the dressing changes which is good news. Fortunately there is no signs of infection. He still has some of the hyper granular nodules noted we have not really cleared all these away and I think we can try to do a little bit more that today. I did do quite a bit last week I am hoping to get down to the base of the wound so though actually be able to heal more appropriately. I think the wound VAC is doing a good job right now with controlling the drainage and overall the patient is happy with that. 06/07/2019 upon evaluation today patient appears to be doing quite well compared to last week with regard to the hyper granulation in  the base of the wound. He has been tolerating the dressing changes without complication. Fortunately there is no signs of active infection at this time. With that being said we have not had any recent measures as far as blood work is concerned I think we may want to check a few things including a CBC, sed rate, and C-reactive protein. Patient is in agreement with that we can try to get that scheduled for him for this Friday. 06/13/2019 on evaluation today patient actually appears to be doing much better at this point based on what I am seeing with regard to his wound. He has much less in the way of hyper granular tissue which is good news and a lot more healing that is taken place since I last saw him as far as the amount of space within the wound itself. With that being said he is nearing the completion of the initial 40 treatments for hyperbaric oxygen therapy I think this is something I would recommend extending as he does seem to be benefiting at this time. Fortunately there is no evidence of active  systemic infection at this point nor even local infection for that matter. 06/20/2019 upon evaluation today patient actually appears to be doing well with regard to his wound. In fact this appears to be doing much better today compared to what it was previous. I been extremely pleased with the progress that he is made. In fact the tunnel is much less deep today than what it was in the past and I am seeing excellent signs of improvement. Overall I am happy with how things are going. I did review his white blood cell count which revealed everything to be okay. The one abnormal finding on his CBC was a hemoglobin of 12.7. Subsequently I did also review his sed rate and C-reactive protein. His sed rate measured in at normal level measuring at 26. The C-reactive protein however was elevated today and an abnormal range indicating inflammation. Still I feel like he is trending in a good direction at this time. He is going to need a refill of the Levaquin he tells me at this point. 07/04/2019 on evaluation today patient actually appears to be doing well in regard to his wound. He has been tolerating the dressing changes without complication. Fortunately there is no signs of active infection which is good news. No fevers, chills, nausea, vomiting, or diarrhea. The good news also is that I did review his labs and though his sed rate was slightly elevated the C-reactive protein was actually down compared to the last evaluation 2 weeks ago this is excellent news I think it is showing signs of improvement. 07/11/2019 on evaluation today patient actually appears to be doing quite well with regard to his wound on my evaluation. This is very slow going but nonetheless he seems to be making progress. Especially in regard to the depth. Nonetheless I believe that we may want to consider going forward with the PriMatrix which was previously approved for him. We will get a have to get a new approval as we will actually  be applying this in the new year so we will likely obtain that approval on January 4. In the meantime and for the time being my suggestion is going to be that we go ahead and continue with the wound VAC and we were going at this point with the current wound care measures. Patient History Information obtained from Patient. Family History Hypertension - Father, Stroke - Mother,  No family history of Cancer, Diabetes, Heart Disease, Kidney Disease, Lung Disease, Seizures, Thyroid Problems, Tuberculosis. [name], [name] ([id number]) Social History Never smoker, Marital Status - Married, Alcohol Use - Never, Drug Use - No History, Caffeine Use - Daily. Medical History Eyes Patient has history of Cataracts - both removed Denies history of Glaucoma, Optic Neuritis Ear/Nose/Mouth/Throat Denies history of Chronic sinus problems/congestion, Middle ear problems Hematologic/Lymphatic Denies history of Anemia, Hemophilia, Human Immunodeficiency Virus, Lymphedema Respiratory Denies history of Aspiration, Asthma, Chronic Obstructive Pulmonary Disease (COPD), Pneumothorax, Sleep Apnea, Tuberculosis Cardiovascular Patient has history of Hypertension - takes medication Denies history of Angina, Arrhythmia, Congestive Heart Failure, Coronary Artery Disease, Deep Vein Thrombosis, Hypotension, Myocardial Infarction, Peripheral Arterial Disease, Peripheral Venous Disease, Phlebitis, Vasculitis Gastrointestinal Denies history of Cirrhosis , Colitis, Crohn s, Hepatitis A, Hepatitis B, Hepatitis C Endocrine Denies history of Type I Diabetes, Type II Diabetes Genitourinary Denies history of End Stage Renal Disease Immunological Denies history of Lupus Erythematosus, Raynaud s, Scleroderma Integumentary (Skin) Denies history of History of Burn, History of pressure wounds Musculoskeletal Denies history of Gout, Rheumatoid Arthritis, Osteoarthritis, Osteomyelitis Neurologic Patient has history of  Paraplegia - waist down Denies history of Dementia, Neuropathy, Quadriplegia, Seizure Disorder Oncologic Denies history of Received Chemotherapy, Received Radiation Psychiatric Denies history of Anorexia/bulimia, Confinement Anxiety Medical And Surgical History Notes Oncologic Prostate cancer- currently treated with horomone therapy Review of Systems (ROS) Constitutional Symptoms (General Health) Denies complaints or symptoms of Fatigue, Fever, Chills, Marked Weight Change. Respiratory Denies complaints or symptoms of Chronic or frequent coughs, Shortness of Breath. Cardiovascular Denies complaints or symptoms of Chest pain, LE edema. Psychiatric Denies complaints or symptoms of Anxiety, Claustrophobia. [name], KUBLY ([id number]) Objective Constitutional Well-nourished and well-hydrated in no acute distress. Vitals Time Taken: 10:12 AM, Height: 73 in, Weight: 210 lbs, BMI: 27.7, Temperature: 98.0 F, Pulse: 66 bpm, Respiratory Rate: 16 breaths/min, Blood Pressure: 106/64 mmHg. Respiratory normal breathing without difficulty. Psychiatric this patient is able to make decisions and demonstrates good insight into disease process. Alert and Oriented x 3. pleasant and cooperative. General Notes: Patient's wound bed actually showed signs of still a few hyper granular areas not nearly as bad as before but I feel like we are making some progress in this regard. Am going to perform some debridement today to clear away these hyper granular lesions which this was tolerated and performed today with minimal bleeding I was able to use chemical cauterization to stop the bleeding. Subsequently I am getting go ahead and continue with the wound VAC at this point though I do believe that a skin substitute may be in order. Integumentary (Hair, Skin) Wound #1 status is Open. Original cause of wound was Pressure Injury. The wound is located on the Right Gluteal fold. The wound measures 3cm length  x 2.4cm width x 2.5cm depth; 5.655cm^2 area and 14.137cm^3 volume. There is muscle and Fat Layer (Subcutaneous Tissue) Exposed exposed. There is no undermining noted, however, there is tunneling at 1:00 with a maximum distance of 4cm. There is a large amount of serous drainage noted. The wound margin is epibole. There is large (67-100%) pink, hyper - granulation within the wound bed. There is a small (1-33%) amount of necrotic tissue within the wound bed including Adherent Slough. 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 There was a Excisional Skin/Subcutaneous Tissue Debridement with a total area of 7.2 sq cm performed by [name], Ashlee Player E., [name]. With the following instrument(s): Curette to remove Viable and Non-Viable tissue/material. Material removed includes Subcutaneous Stthomas, Stacie J. ([id number]) Tissue, Slough, and Hyper-granulation after achieving pain control using Lidocaine. No specimens were taken. A time out was conducted at 10:45, prior to the start of the procedure. A Moderate amount of bleeding was controlled with Pressure. The procedure was tolerated well with a pain level of 0 throughout and a pain level of 0 following the procedure. Post Debridement Measurements: 3cm length x 2.4cm width x 2.5cm depth; 14.137cm^3 volume. Post debridement Stage noted as Category/Stage IV. Character of Wound/Ulcer Post Debridement is improved. Post procedure Diagnosis Wound #1: Same as Pre-Procedure Plan Wound Cleansing: Wound #1 Right Gluteal fold: Clean wound with Normal Saline. - in office Cleanse wound with mild soap and water Skin  Barriers/Peri-Wound Care: Skin Prep Primary Wound Dressing: Wound #1 Right Gluteal fold: Silver Collagen - under NPWT Other: - in clinic - plain alginate, gauze and tape Dressing Change Frequency: Wound #1 Right Gluteal fold: Change Dressing Monday, Wednesday, Friday Follow-up Appointments: Wound #1 Right Gluteal fold: Return Appointment in 1 week. Off-Loading: Wound #1 Right Gluteal fold: Turn and reposition every 2 hours Home Health: Wound #1 Right Gluteal fold: [place] Nurse may visit PRN to address patient s wound care needs. FACE TO FACE ENCOUNTER: MEDICARE and MEDICAID PATIENTS: I certify that this patient is under my care and that I had a face-to-face encounter that meets the physician face-to-face encounter requirements with this patient on this date. The encounter with the patient was in whole or in part for the following MEDICAL CONDITION: (primary reason for [place]) MEDICAL NECESSITY: I certify, that based on my findings, NURSING services are a medically necessary home health service. HOME BOUND STATUS: I certify that my clinical findings support that this patient is homebound (i.e., Due to illness or injury, pt requires aid of supportive devices such as crutches, cane, wheelchairs, walkers, the use of special transportation or the assistance of another person to leave their place of residence. There is a normal inability to leave the home and doing so requires considerable and taxing effort. Other absences are for medical reasons / religious services and are infrequent or of short duration when for other reasons). If current dressing causes regression in wound condition, may D/C ordered dressing product/s and apply Normal Saline Moist Dressing daily until next [place] / Other MD appointment. Steele of regression in wound condition at [phone]. Please direct any NON-WOUND related issues/requests  for orders to patient's Primary Care Physician Negative Pressure Wound Therapy: Wound #1 Right Gluteal fold: Wound VAC settings at 125/130 mmHg continuous pressure. Use BLACK/GREEN foam to wound cavity. Use WHITE foam to fill any tunnel/s and/or undermining. Change VAC dressing 3 X WEEK. Change canister as indicated when full. Nurse may titrate settings and frequency of dressing changes as clinically indicated. Home Health Nurse may d/c VAC for s/s of increased infection, significant wound regression, or uncontrolled drainage. [place] at [phone]. Apply contact layer over base of wound. - apply prisma/silver collagen to the base of the wound NATHANUAL, RASK. ([id number]) Number of foam/gauze pieces used in the dressing = Hyperbaric Oxygen Therapy: Wound #1 Right Gluteal fold: Evaluate for HBO Therapy Indication: - Right  ischial osteomyelitis If appropriate for treatment, begin HBOT per protocol: 2.0 ATA for 90 Minutes without Air Breaks One treatment per day (delivered Monday through Friday unless otherwise specified in Special Instructions below): Total # of Treatments: - 60 Laboratory ordered were: CBC W Auto Differential panel - HHRN to draw CBC w/ Diff, Sed Rate, and CRP on Monday, December 28th. Please FAX results to [facility] The following medication(s) was prescribed: Levaquin oral 500 mg tablet 1 1 tablet oral daily for 2 weeks. do not take iron with this medication starting 07/11/2019 1. My suggestion at this point is good to be that we go ahead and initiate a continuation of the wound VAC for the time being the will obtain on January 4 a rePete authorization for the PriMatrix to see if we can use this in the wound to help achieve a better healing experience. 2. In the meantime I am going to recommend that we continue as well with the Levaquin and I will go ahead and provide a refill for that today for the patient. His lab work has been more  reassuring each time I see it where he can obtain 1 more round of the lab work next week on Monday to compared to what we have currently. I will review that at his follow-up appointment in roughly 2 weeks obviously. 3. I still recommend continued offloading the patient seems to be doing well in that regard at this point. We will see patient back for reevaluation in 1 week here in the clinic. If anything worsens or changes patient will contact our office for additional recommendations. Electronic Signature(s) Signed: 07/11/2019 6:05:08 PM By: Worthy Keeler [name] Entered By: Worthy Keeler on 07/11/2019 18:05:08 AL, LULLO ([id number]) -------------------------------------------------------------------------------- ROS/PFSH Details Patient Name: [name] Date of Service: 07/11/2019 10:00 AM Medical Record Number: [id number] Patient Account Number: [id number] Date of Birth/Sex: 10/31/1950 (68 y.o. M) Treating RN: Harold Barban Primary Care Provider: [name] Other Clinician: Referring Provider: [name] Treating Provider/Extender: [name], Kentarius Partington Weeks in Treatment: 88 Information Obtained From Patient Constitutional Symptoms (General Health) Complaints and Symptoms: Negative for: Fatigue; Fever; Chills; Marked Weight Change Respiratory Complaints and Symptoms: Negative for: Chronic or frequent coughs; Shortness of Breath Medical History: Negative for: Aspiration; Asthma; Chronic Obstructive Pulmonary Disease (COPD); Pneumothorax; Sleep Apnea; Tuberculosis Cardiovascular Complaints and Symptoms: Negative for: Chest pain; LE edema Medical History: Positive for: Hypertension - takes medication Negative for: Angina; Arrhythmia; Congestive Heart Failure; Coronary Artery Disease; Deep Vein Thrombosis; Hypotension; Myocardial Infarction; Peripheral Arterial Disease; Peripheral Venous Disease; Phlebitis; Vasculitis Psychiatric Complaints and  Symptoms: Negative for: Anxiety; Claustrophobia Medical History: Negative for: Anorexia/bulimia; Confinement Anxiety Eyes Medical History: Positive for: Cataracts - both removed Negative for: Glaucoma; Optic Neuritis Ear/Nose/Mouth/Throat Medical History: Negative for: Chronic sinus problems/congestion; Middle ear problems Hematologic/Lymphatic Medical History: Negative for: Anemia; Hemophilia; Human Immunodeficiency Virus; Lymphedema FLOZELL, BOBBITT. ([id number]) Gastrointestinal Medical History: Negative for: Cirrhosis ; Colitis; Crohnos; Hepatitis A; Hepatitis B; Hepatitis C Endocrine Medical History: Negative for: Type I Diabetes; Type II Diabetes Genitourinary Medical History: Negative for: End Stage Renal Disease Immunological Medical History: Negative for: Lupus Erythematosus; Raynaudos; Scleroderma Integumentary (Skin) Medical History: Negative for: History of Burn; History of pressure wounds Musculoskeletal Medical History: Negative for: Gout; Rheumatoid Arthritis; Osteoarthritis; Osteomyelitis Neurologic Medical History: Positive for: Paraplegia - waist down Negative for: Dementia; Neuropathy; Quadriplegia; Seizure Disorder Oncologic Medical History: Negative for: Received Chemotherapy; Received Radiation Past Medical History Notes: Prostate cancer- currently treated with horomone  therapy HBO Extended History Items Eyes: Cataracts Immunizations Pneumococcal Vaccine: Received Pneumococcal Vaccination: No Implantable Devices No devices added Family and Social History Cancer: No; Diabetes: No; Heart Disease: No; Hypertension: Yes - Father; Kidney Disease: No; Lung Disease: No; Seizures: No; Stroke: Yes - Mother; Thyroid Problems: No; Tuberculosis: No; Never smoker; Marital Status - Married; Alcohol Use: Never; Drug Use: No History; Caffeine Use: Daily FELIZ, WIXON ([id number]) Physician Affirmation I have reviewed and agree with the above  information. Electronic Signature(s) Signed: 07/11/2019 6:06:28 PM By: Worthy Keeler [name] Signed: 07/12/2019 3:54:35 PM By: Harold Barban Entered By: Worthy Keeler on 07/11/2019 18:02:12 AJEE, CUMPSTON ([id number]) -------------------------------------------------------------------------------- SuperBill Details Patient Name: [name] Date of Service: 07/11/2019 Medical Record Number: [id number] Patient Account Number: [id number] Date of Birth/Sex: 27-May-1951 (68 y.o. M) Treating RN: Harold Barban Primary Care Provider: [name] Other Clinician: Referring Provider: [name] Treating Provider/Extender: [name], Elainah Rhyne Weeks in Treatment: 68 Diagnosis Coding ICD-10 Codes Code Description L89.314 Pressure ulcer of right buttock, stage 4 M86.68 Other chronic osteomyelitis, other site L03.317 Cellulitis of buttock G82.20 Paraplegia, unspecified S34.109S Unspecified injury to unspecified level of lumbar spinal cord, sequela I10 Essential (primary) hypertension Facility Procedures CPT4 Code: [id number] Description: [facility] - DEB SUBQ TISSUE 20 SQ CM/< ICD-10 Diagnosis Description L89.314 Pressure ulcer of right buttock, stage 4 Modifier: Quantity: 1 Physician Procedures CPT4 Code: [id number] Description: [id number] - WC PHYS SUBQ TISS 20 SQ CM ICD-10 Diagnosis Description L89.314 Pressure ulcer of right buttock, stage 4 Modifier: Quantity: 1 Electronic Signature(s) Signed: 07/11/2019 6:05:24 PM By: Worthy Keeler [name] Entered By: Worthy Keeler on 07/11/2019 18:05:23

## 2019-07-12 ENCOUNTER — Encounter: Payer: Medicare Other | Admitting: Physician Assistant

## 2019-07-12 DIAGNOSIS — L89314 Pressure ulcer of right buttock, stage 4: Secondary | ICD-10-CM | POA: Diagnosis not present

## 2019-07-12 NOTE — Progress Notes (Signed)
[name], [name] ([id number]) Visit Report for 07/11/2019 HBO Details Patient Name: [name], [name] Date of Service: 07/11/2019 8:00 AM Medical Record Number: [id number] Patient Account Number: [id number] Date of Birth/Sex: 05/11/1951 (68 y.o. 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 HBO Treatment Details Treatment Number: 53 Patient Type: Outpatient Chamber Type: Monoplace Chamber Serial #: [id number] Treatment Protocol: 2.0 ATA with 90 minutes oxygen, and no air breaks Treatment Details Compression Rate Down: 1.5 psi / minute De-Compression Rate Up: 2.5 psi / minute Compress Tx Pressure Air breaks and breathing periods Decompress Decompress Begins Reached (leave unused spaces blank) Begins Ends Chamber Pressure (ATA) 1 2 - - - - - - 2 1 Clock Time (24 hr) 08:16 08:28 - - - - - - 09:58 10:05 Treatment Length: 109 (minutes) Treatment Segments: 4 Vital Signs Capillary Blood Glucose Reference Range: 80 - 120 mg / dl HBO Diabetic Blood Glucose Intervention Range: <131 mg/dl or >249 mg/dl Time Vitals Blood Respiratory Capillary Blood Glucose Pulse Action Type: Pulse: Temperature: Taken: Pressure: Rate: Glucose (mg/dl): Meter #: Oximetry (%) Taken: Pre 08:13 112/74 72 16 98.9 Post 10:12 106/62 66 16 98 Treatment Response Treatment Toleration: Well Treatment Completion Treatment Completed without Adverse Event Status: HBO Attestation I certify that I supervised this HBO treatment in accordance with Medicare guidelines. A trained emergency response Yes team is readily  available per hospital policies and procedures. Continue HBOT as ordered. Yes Electronic Signature(s) [name], [name] ([id number]) Signed: 07/11/2019 6:06:05 PM By: Worthy Keeler [name] Previous Signature: 07/11/2019 2:03:17 PM Version By: Lorine Bears RCP, RRT, CHT Entered By: Worthy Keeler on 07/11/2019 18:06:05 [name], [name] ([id number]) -------------------------------------------------------------------------------- HBO Safety Checklist Details Patient Name: [name] Date of Service: 07/11/2019 8:00 AM Medical Record Number: [id number] Patient Account Number: [id number] Date of Birth/Sex: [birth year] (68 y.o. M) Treating RN: Harold Barban Primary Care Marylu Dudenhoeffer: [name] Other Clinician: [name] Referring Kullen Tomasetti: [name] Treating Liyanna Cartwright/Extender: [name], HOYT Weeks in Treatment: 53 HBO Safety Checklist Items Safety Checklist Consent Form Signed Patient voided / foley secured and emptied When did you last eato morning Last dose of injectable or oral agent n/a Ostomy pouch emptied and vented if applicable NA Medtronic Baclofen Pump; Nevro All implantable devices assessed, documented and approved Spinal Stimulator Intravenous access site secured and place NA Valuables secured Linens and cotton and cotton/polyester blend (less than 51% polyester) Personal oil-based products / skin lotions / body lotions removed Wigs or hairpieces removed NA Smoking or tobacco materials removed NA Books / newspapers / magazines / loose paper removed Cologne, aftershave, perfume and deodorant removed Jewelry removed (may wrap wedding band) Make-up removed NA Hair care products removed Battery operated devices (external) removed NPWT pump Heating patches and chemical warmers removed Titanium eyewear removed NA Nail polish cured greater than 10 hours NA Casting material cured greater than 10 hours NA Hearing aids  removed NA Loose dentures or partials removed NA Prosthetics have been removed NA Patient demonstrates correct use of air break device (if applicable) Patient concerns have been addressed Patient grounding bracelet on and cord attached to chamber Specifics for Inpatients (complete in addition to above) Medication sheet sent with patient Intravenous medications needed  or due during therapy sent with patient Drainage tubes (e.g. nasogastric tube or chest tube secured and vented) Endotracheal or Tracheotomy tube secured Cuff deflated of air and inflated with saline Airway suctioned Electronic Signature(s) [name], [name] ([id number]) Signed: 07/11/2019 2:03:17 PM By: Lorine Bears RCP, RRT, CHT Entered By: Lorine Bears on 07/11/2019 08:26:46

## 2019-07-12 NOTE — Progress Notes (Signed)
SAICHARAN, SPARACINO ([id number]) Visit Report for 07/11/2019 Problem List Details Patient Name: [name], [name] Date of Service: 07/11/2019 8:00 AM Medical Record Number: [id number] Patient Account Number: [id number] Date of Birth/Sex: 1951-01-12 (68 y.o. M) Treating RN: Harold Barban Primary Care Provider: [name] Other Clinician: [name] Referring Provider: [name] Treating Provider/Extender: [name], Necia Kamm Weeks in Treatment: 62 Active Problems ICD-10 Evaluated Encounter Code Description Active Date Today Diagnosis L89.314 Pressure ulcer of right buttock, stage 4 11/30/2017 No Yes M86.68 Other chronic osteomyelitis, other site 04/08/2019 No Yes L03.317 Cellulitis of buttock 06/21/2018 No Yes G82.20 Paraplegia, unspecified 11/30/2017 No Yes S34.109S Unspecified injury to unspecified level of lumbar spinal cord, 11/30/2017 No Yes sequela I10 Essential (primary) hypertension 11/30/2017 No Yes Inactive Problems Resolved Problems Electronic Signature(s) Signed: 07/11/2019 6:06:14 PM By: Worthy Keeler [name] Entered By: Worthy Keeler on 07/11/2019 18:06:13 [name], [name] ([id number]) -------------------------------------------------------------------------------- SuperBill Details Patient Name: [name] Date of Service: 07/11/2019 Medical Record Number: [id number] Patient Account Number: [id number] Date of Birth/Sex: 25-Jul-1950 (68 y.o. 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, sequela I10  Essential (primary) hypertension Facility Procedures CPT4 Code: [id number] Description: (Facility Use Only) HBOT, full body chamber, 72min Modifier: Quantity: 4 Physician Procedures CPT4 Code: [id number] Description: [id number] - WC PHYS HYPERBARIC OXYGEN THERAPY ICD-10 Diagnosis Description M86.68 Other chronic osteomyelitis, other site L89.314 Pressure ulcer of right buttock, stage 4 L03.317 Cellulitis of buttock G82.20 Paraplegia, unspecified Modifier: Quantity: 1 Electronic Signature(s) Signed: 07/11/2019 6:06:10 PM By: Worthy Keeler [name] Previous Signature: 07/11/2019 2:03:17 PM Version By: Lorine Bears RCP, RRT, CHT Entered By: Worthy Keeler on 07/11/2019 18:06:10

## 2019-07-13 ENCOUNTER — Encounter: Payer: Medicare Other | Admitting: Internal Medicine

## 2019-07-13 ENCOUNTER — Other Ambulatory Visit: Payer: Self-pay

## 2019-07-13 DIAGNOSIS — L89314 Pressure ulcer of right buttock, stage 4: Secondary | ICD-10-CM | POA: Diagnosis not present

## 2019-07-13 NOTE — Progress Notes (Signed)
NEVYN, GARG ([id number]) Visit Report for 07/13/2019 Arrival Information Details Patient Name: [name], [name] Date of Service: 07/13/2019 8:00 AM Medical Record Number: [id number] Patient Account Number: [id number] Date of Birth/Sex: 1950-11-16 (68 y.o. M) Treating RN: [name] Primary Care Colton Engdahl: [name] Other Clinician: [name] Referring Jaquavion Mccannon: [name] Treating Patrich Heinze/Extender: [name] in Treatment: 72 Visit Information History Since Last Visit Added or deleted any medications: No Patient Arrived: Wheel Chair Any new allergies or adverse reactions: No Arrival Time: 08:05 Had a fall or experienced change in No Accompanied By: wife activities of daily living that may affect Transfer Assistance: Harrel Lemon Lift risk of falls: Patient Identification Verified: Yes Signs or symptoms of abuse/neglect since last visito No Secondary Verification Process Completed: Yes Hospitalized since last visit: No Patient Requires Transmission-Based No Implantable device outside of the clinic excluding No Precautions: cellular tissue based products placed in the center Patient Has Alerts: Yes since last visit: Patient Alerts: NOT Has Dressing in Place as Prescribed: Yes Diabetic Pain Present Now: No Electronic Signature(s) Signed: 07/13/2019 12:03:39 PM By: Lorine Bears RCP, RRT, CHT Entered By: Lorine Bears on 07/13/2019 08:31:09 [name], [name] ([id number]) -------------------------------------------------------------------------------- Encounter Discharge Information Details Patient Name: [name] Date of Service: 07/13/2019 8:00 AM Medical Record Number: [id number] Patient Account Number: [id number] Date of Birth/Sex: May 10, 1951 (68 y.o. M) Treating RN: [name] Primary Care Gwendolen Hewlett: [name] Other Clinician: [name] Referring Gevorg Brum: [name] Treating Sybilla Malhotra/Extender: [name] in Treatment: 50 Encounter Discharge Information Items Discharge Condition: Stable Ambulatory Status: Wheelchair Discharge Destination: Home Transportation: Private Auto Accompanied By: wife Schedule Follow-up Appointment: Yes Clinical Summary of Care: Notes Patient has an HBO treatment scheduled on 07/14/2019 at 08:00 am. Electronic Signature(s) Signed: 07/13/2019 12:03:39 PM By: Lorine Bears RCP, RRT, CHT Entered By: Lorine Bears on 07/13/2019 12:03:21 [name], [name] ([id number]) -------------------------------------------------------------------------------- Vitals Details Patient Name: [name] Date of Service: 07/13/2019 8:00 AM Medical Record Number: [id number] Patient Account Number: [id number] Date of Birth/Sex: 10-03-1950 (68 y.o. M) Treating RN: [name] Primary Care Kewon Statler: [name] Other Clinician: [name] Referring Serrita Lueth: [name] Treating Tayler Lassen/Extender: [name] in Treatment: 5 Vital Signs Time Taken: 08:18 Temperature (F): 98.6 Height (in): 73 Pulse (bpm): 72 Weight (lbs): 210 Respiratory Rate (breaths/min): 16 Body Mass Index (BMI): 27.7 Blood Pressure (mmHg): 122/66 Reference Range: 80 - 120 mg / dl Electronic Signature(s) Signed: 07/13/2019 12:03:39 PM By: Lorine Bears RCP, RRT, CHT Entered By: Becky Sax, Amado Nash on 07/13/2019 08:31:42

## 2019-07-14 ENCOUNTER — Encounter: Payer: Medicare Other | Admitting: Physician Assistant

## 2019-07-14 DIAGNOSIS — L89314 Pressure ulcer of right buttock, stage 4: Secondary | ICD-10-CM | POA: Diagnosis not present

## 2019-07-14 NOTE — Progress Notes (Signed)
JOHNERIC, GOLEY ([id number]) Visit Report for 07/14/2019 HBO Details Patient Name: [name], [name] Date of Service: 07/14/2019 8:00 AM Medical Record Number: [id number] Patient Account Number: [id number] Date of Birth/Sex: 1951-03-20 (68 y.o. 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 HBO Treatment Details Treatment Number: 56 Patient Type: Outpatient Chamber Type: Monoplace Chamber Serial #: [id number] Treatment Protocol: 2.0 ATA with 90 minutes oxygen, and no air breaks Treatment Details Compression Rate Down: 1.0 psi / minute De-Compression Rate Up: 2.5 psi / minute Compress Tx Pressure Air breaks and breathing periods Decompress Decompress Begins Reached (leave unused spaces blank) Begins Ends Chamber Pressure (ATA) 1 2 - - - - - - 2 1 Clock Time (24 hr) 08:18 08:40 - - - - - - 10:10 10:16 Treatment Length: 118 (minutes) Treatment Segments: 4 Vital Signs Capillary Blood Glucose Reference Range: 80 - 120 mg / dl HBO Diabetic Blood Glucose Intervention Range: <131 mg/dl or >249 mg/dl Time Vitals Blood Respiratory Capillary Blood Glucose Pulse Action Type: Pulse: Temperature: Taken: Pressure: Rate: Glucose (mg/dl): Meter #: Oximetry (%) Taken: Pre 08:14 106/68 72 16 98.8 Post 10:20 110/72 66 16 98 Treatment Response Treatment Toleration: Well Treatment Completion Treatment Completed without Adverse Event Status: HBO Attestation I certify that I supervised this HBO treatment in accordance with Medicare guidelines. A trained emergency response Yes team is readily  available per hospital policies and procedures. Continue HBOT as ordered. Yes Electronic Signature(s) MORITZ, GILILLAND ([id number]) Signed: 07/14/2019 12:19:15 PM By: Worthy Keeler [name] Previous Signature: 07/14/2019 10:42:40 AM Version By: Lorine Bears RCP, RRT, CHT Entered By: Worthy Keeler on 07/14/2019 12:19:15 KHALIFAH, MACDOUGALL ([id number]) -------------------------------------------------------------------------------- HBO Safety Checklist Details Patient Name: [name] Date of Service: 07/14/2019 8:00 AM Medical Record Number: [id number] Patient Account Number: [id number] Date of Birth/Sex: [birth year] (68 y.o. M) Treating RN: Montey Hora Primary Care Marvina Danner: [name] Other Clinician: [name] Referring Levell Tavano: [name] Treating Onnie Hatchel/Extender: [name], HOYT Weeks in Treatment: 28 HBO Safety Checklist Items Safety Checklist Consent Form Signed Patient voided / foley secured and emptied When did you last eato morning Last dose of injectable or oral agent n/a Ostomy pouch emptied and vented if applicable NA Medtronic Baclofen Pump; Nevro All implantable devices assessed, documented and approved Spinal Stimulator Intravenous access site secured and place NA Valuables secured Linens and cotton and cotton/polyester blend (less than 51% polyester) Personal oil-based products / skin lotions / body lotions removed Wigs or hairpieces removed NA Smoking or tobacco materials removed NA Books / newspapers / magazines / loose paper removed Cologne, aftershave, perfume and deodorant removed Jewelry removed (may wrap wedding band) Make-up removed NA Hair care products removed Battery operated devices (external) removed NPWT pump Heating patches and chemical warmers removed Titanium eyewear removed NA Nail polish cured greater than 10 hours NA Casting material cured greater than 10 hours NA Hearing aids  removed NA Loose dentures or partials removed NA Prosthetics have been removed NA Patient demonstrates correct use of air break device (if applicable) Patient concerns have been addressed Patient grounding bracelet on and cord attached to chamber Specifics for Inpatients (complete in addition to above) Medication sheet sent with patient Intravenous medications needed  or due during therapy sent with patient Drainage tubes (e.g. nasogastric tube or chest tube secured and vented) Endotracheal or Tracheotomy tube secured Cuff deflated of air and inflated with saline Airway suctioned Electronic Signature(s) JORGELUIS, MUKES ([id number]) Signed: 07/14/2019 10:42:40 AM By: Lorine Bears RCP, RRT, CHT Entered By: Lorine Bears on 07/14/2019 08:53:28

## 2019-07-14 NOTE — Progress Notes (Signed)
MINORU, QUAIN ([id number]) Visit Report for 07/14/2019 Arrival Information Details Patient Name: LATRELLE, NOAH Date of Service: 07/14/2019 8:00 AM Medical Record Number: [id number] Patient Account Number: [id number] Date of Birth/Sex: 06/29/1951 (68 y.o. M) Treating RN: Montey Hora Primary Care Gaelan Glennon: [name] Other Clinician: [name] Referring Keithon Mccoin: [name] Treating Relena Ivancic/Extender: [name], HOYT Weeks in Treatment: 30 Visit Information History Since Last Visit Added or deleted any medications: No Patient Arrived: Wheel Chair Any new allergies or adverse reactions: No Arrival Time: 08:50 Had a fall or experienced change in No Accompanied By: wife activities of daily living that may affect Transfer Assistance: Harrel Lemon Lift risk of falls: Patient Identification Verified: Yes Signs or symptoms of abuse/neglect since last visito No Secondary Verification Process Completed: Yes Hospitalized since last visit: No Patient Requires Transmission-Based No Implantable device outside of the clinic excluding No Precautions: cellular tissue based products placed in the center Patient Has Alerts: Yes since last visit: Patient Alerts: NOT Has Dressing in Place as Prescribed: Yes Diabetic Pain Present Now: No Electronic Signature(s) Signed: 07/14/2019 10:42:40 AM By: Lorine Bears RCP, RRT, CHT Entered By: Lorine Bears on 07/14/2019 08:51:16 TYEE, HOGGARD ([id number]) -------------------------------------------------------------------------------- Encounter Discharge Information Details Patient Name: [name] Date of Service: 07/14/2019 8:00 AM Medical Record Number: [id number] Patient Account Number: [id number] Date of Birth/Sex: 1951/07/18 (68 y.o. M) Treating RN: Montey Hora Primary Care Maddex Garlitz: [name] Other Clinician: [name] Referring Meia Emley: [name] Treating Letti Towell/Extender: [name], HOYT Weeks in Treatment: 45 Encounter Discharge Information Items Discharge Condition: Stable Ambulatory Status: Wheelchair Discharge Destination: Home Transportation: Private Auto Accompanied By: wife Schedule Follow-up Appointment: Yes Clinical Summary of Care: Notes Patient has an HBO treatment scheduled on 07/18/2019 at 08:00 am. Electronic Signature(s) Signed: 07/14/2019 10:42:40 AM By: Lorine Bears RCP, RRT, CHT Entered By: Lorine Bears on 07/14/2019 10:42:20 MADEX, BATTERSON ([id number]) -------------------------------------------------------------------------------- Vitals Details Patient Name: [name] Date of Service: 07/14/2019 8:00 AM Medical Record Number: [id number] Patient Account Number: [id number] Date of Birth/Sex: Mar 12, 1951 (68 y.o. M) Treating RN: Montey Hora Primary Care Anaysia Germer: [name] Other Clinician: [name] Referring Henry Demeritt: [name] Treating Kolbee Bogusz/Extender: [name], HOYT Weeks in Treatment: 20 Vital Signs Time Taken: 08:14 Temperature (F): 98.8 Height (in): 73 Pulse (bpm): 72 Weight (lbs): 210 Respiratory Rate (breaths/min): 16 Body Mass Index (BMI): 27.7 Blood Pressure (mmHg): 106/68 Reference Range: 80 - 120 mg / dl Electronic Signature(s) Signed: 07/14/2019 10:42:40 AM By: Lorine Bears RCP, RRT, CHT Entered By: Lorine Bears on 07/14/2019 08:51:54

## 2019-07-14 NOTE — Progress Notes (Signed)
SHION, STIFEL ([id number]) Visit Report for 07/14/2019 Problem List Details Patient Name: [name], [name] Date of Service: 07/14/2019 8:00 AM Medical Record Number: [id number] Patient Account Number: [id number] Date of Birth/Sex: Jul 05, 1951 (68 y.o. 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, sequela I10  Essential (primary) hypertension Facility Procedures CPT4 Code: [id number] Description: (Facility Use Only) HBOT, full body chamber, 62min Modifier: Quantity: 4 Physician Procedures CPT4 Code: [id number] Description: [id number] - WC PHYS HYPERBARIC OXYGEN THERAPY ICD-10 Diagnosis Description M86.68 Other chronic osteomyelitis, other site L89.314 Pressure ulcer of right buttock, stage 4 L03.317 Cellulitis of buttock G82.20 Paraplegia, unspecified Modifier: Quantity: 1 Electronic Signature(s) Signed: 07/14/2019 12:19:21 PM By: Worthy Keeler [name] Previous Signature: 07/14/2019 10:42:40 AM Version By: Lorine Bears RCP, RRT, CHT Entered By: Worthy Keeler on 07/14/2019 12:19:21

## 2019-07-14 NOTE — Progress Notes (Signed)
ENDRIT, GOODBAR ([id number]) Visit Report for 07/13/2019 HBO Details Patient Name: [name], [name] Date of Service: 07/13/2019 8:00 AM Medical Record Number: [id number] Patient Account Number: [id number] Date of Birth/Sex: 06/01/1951 (68 y.o. 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 HBO Treatment Details Treatment Number: 55 Patient Type: Outpatient Chamber Type: Monoplace Chamber Serial #: [id number] Treatment Protocol: 2.0 ATA with 90 minutes oxygen, and no air breaks Treatment Details Compression Rate Down: 1.5 psi / minute De-Compression Rate Up: 1.5 psi / minute Compress Tx Pressure Air breaks and breathing periods Decompress Decompress Begins Reached (leave unused spaces blank) Begins Ends Chamber Pressure (ATA) 1 2 - - - - - - 2 1 Clock Time (24 hr) 08:22 08:33 - - - - - - 10:04 10:14 Treatment Length: 112 (minutes) Treatment Segments: 4 Vital Signs Capillary Blood Glucose Reference Range: 80 - 120 mg / dl HBO Diabetic Blood Glucose Intervention Range: <131 mg/dl or >249 mg/dl Time Vitals Blood Respiratory Capillary Blood Glucose Pulse Action Type: Pulse: Temperature: Taken: Pressure: Rate: Glucose (mg/dl): Meter #: Oximetry (%) Taken: Pre 08:18 122/66 72 16 98.6 Post 10:20 132/72 66 16 97.9 Treatment Response Treatment Toleration: Well Treatment Completion Treatment Completed without Adverse Event Status: [name] Notes No concerns with treatment given HBO Attestation I certify that I supervised this HBO treatment in accordance with Medicare guidelines. A  trained emergency response Yes team is readily available per hospital policies and procedures. Continue HBOT as ordered. [address] KORDAI, SKEMP ([id number]) Electronic Signature(s) Signed: 07/13/2019 5:48:31 PM By: Linton Ham MD Previous Signature: 07/13/2019 12:03:39 PM Version By: Lorine Bears RCP, RRT, CHT Entered By: Linton Ham on 07/13/2019 17:47:36 [name], [name] ([id number]) -------------------------------------------------------------------------------- HBO Safety Checklist Details Patient Name: [name] Date of Service: 07/13/2019 8:00 AM Medical Record Number: [id number] Patient Account Number: [id number] Date of Birth/Sex: 01-Mar-1951 (68 y.o. M) Treating RN: [name] Primary Care Jeanmarc Viernes: [name] Other Clinician: [name] Referring Tyese Finken: [name] Treating Zain Bingman/Extender: [name] in Treatment: 59 HBO Safety Checklist Items Safety Checklist Consent Form Signed Patient voided / foley secured and emptied When did you last eato morning Last dose of injectable or oral agent n/a Ostomy pouch emptied and vented if applicable NA Medtronic Baclofen Pump; Nevro All implantable devices assessed, documented and approved Spinal Stimulator Intravenous access site secured and place NA Valuables secured NA Linens and cotton and cotton/polyester blend (less than 51% polyester) Personal oil-based products / skin lotions / body lotions removed Wigs or hairpieces removed NA Smoking or tobacco materials removed NA Books / newspapers / magazines / loose paper removed Cologne, aftershave, perfume and deodorant removed Jewelry removed (may wrap wedding band) Make-up removed NA Hair care products removed Battery operated devices (external) removed NPWT pump Heating patches and chemical warmers removed Titanium eyewear removed NA Nail polish cured greater than 10 hours NA Casting  material cured greater than 10 hours NA Hearing aids removed NA Loose dentures or partials removed NA Prosthetics have been removed NA Patient demonstrates correct use of air break device (if applicable) Patient concerns have been addressed Patient grounding bracelet on and cord attached to chamber Specifics for Inpatients (complete in addition to above) Medication sheet  sent with patient Intravenous medications needed or due during therapy sent with patient Drainage tubes (e.g. nasogastric tube or chest tube secured and vented) Endotracheal or Tracheotomy tube secured Cuff deflated of air and inflated with saline Airway suctioned Electronic Signature(s) DARKO, YECK ([id number]) Signed: 07/13/2019 12:03:39 PM By: Lorine Bears RCP, RRT, CHT Entered By: Lorine Bears on 07/13/2019 08:32:44

## 2019-07-18 ENCOUNTER — Encounter: Payer: Medicare Other | Admitting: Internal Medicine

## 2019-07-18 ENCOUNTER — Other Ambulatory Visit: Payer: Self-pay

## 2019-07-18 DIAGNOSIS — L89314 Pressure ulcer of right buttock, stage 4: Secondary | ICD-10-CM | POA: Diagnosis not present

## 2019-07-18 NOTE — Progress Notes (Signed)
[name] ([id number]) Visit Report for 07/18/2019 Arrival Information Details Patient Name: [name] Date of Service: 07/18/2019 10:00 AM Medical Record Number: [id number] Patient Account Number: [id number] Date of Birth/Sex: [birth year] (68 y.o. M) Treating RN: Harold Barban Primary Care Donn Wilmot: [name] Other Clinician: Referring Jaylenn Altier: [name] Treating Calynn Ferrero/Extender: Beverly Gust in Treatment: 96 Visit Information History Since Last Visit Added or deleted any medications: No Patient Arrived: Wheel Chair Any new allergies or adverse reactions: No Arrival Time: 10:15 Had a fall or experienced change in No Accompanied By: wife activities of daily living that may affect Transfer Assistance: Harrel Lemon Lift risk of falls: Patient Identification Verified: Yes Signs or symptoms of abuse/neglect since last visito No Secondary Verification Process Completed: Yes Hospitalized since last visit: No Patient Requires Transmission-Based No Implantable device outside of the clinic excluding No Precautions: cellular tissue based products placed in the center Patient Has Alerts: Yes since last visit: Patient Alerts: NOT Has Dressing in Place as Prescribed: Yes Diabetic Pain Present Now: No Electronic Signature(s) Signed: 07/18/2019 12:31:40 PM By: Lorine Bears RCP, RRT, CHT Entered By: Lorine Bears on 07/18/2019 10:21:48 [name] ([id number]) -------------------------------------------------------------------------------- Clinic Level of Care Assessment Details Patient Name: [name] Date of Service: 07/18/2019 10:00 AM Medical Record Number: [id number] Patient Account Number: [id number] Date of Birth/Sex: 02/07/1951 (68 y.o. M) Treating RN: Harold Barban Primary Care Alexyia Guarino: [name] Other Clinician: Referring Shaleah Nissley: [name] Treating Dardan Shelton/Extender:  Beverly Gust in Treatment: 41 Clinic Level of Care Assessment Items TOOL 4 Quantity Score []  - Use when only an EandM is performed on FOLLOW-UP visit 0 ASSESSMENTS - Nursing Assessment / Reassessment X - Reassessment of Co-morbidities (includes updates in patient status) 1 10 X- 1 5 Reassessment of Adherence to Treatment Plan ASSESSMENTS - Wound and Skin Assessment / Reassessment X - Simple Wound Assessment / Reassessment - one wound 1 5 []  - 0 Complex Wound Assessment / Reassessment - multiple wounds []  - 0 Dermatologic / Skin Assessment (not related to wound area) ASSESSMENTS - Focused Assessment []  - Circumferential Edema Measurements - multi extremities 0 []  - 0 Nutritional Assessment / Counseling / Intervention []  - 0 Lower Extremity Assessment (monofilament, tuning fork, pulses) []  - 0 Peripheral Arterial Disease Assessment (using hand held doppler) ASSESSMENTS - Ostomy and/or Continence Assessment and Care []  - Incontinence Assessment and Management 0 []  - 0 Ostomy Care Assessment and Management (repouching, etc.) PROCESS - Coordination of Care X - Simple Patient / Family Education for ongoing care 1 15 []  - 0 Complex (extensive) Patient / Family Education for ongoing care []  - 0 Staff obtains Programmer, systems, Records, Test Results / Process Orders X- 1 10 Staff telephones HHA, Nursing Homes / Clarify orders / etc []  - 0 Routine Transfer to another Facility (non-emergent condition) []  - 0 Routine Hospital Admission (non-emergent condition) []  - 0 New Admissions / Biomedical engineer / Ordering NPWT, Apligraf, etc. []  - 0 Emergency Hospital Admission (emergent condition) X- 1 10 Simple Discharge Coordination [name]. ([id number]) []  - 0 Complex (extensive) Discharge Coordination PROCESS - Special Needs []  - Pediatric / Minor Patient Management 0 []  - 0 Isolation Patient Management []  - 0 Hearing / Language / Visual special needs []  -  0 Assessment of Community assistance (transportation, D/C planning, etc.) []  - 0 Additional assistance / Altered mentation []  - 0 Support Surface(s) Assessment (bed, cushion, seat, etc.) INTERVENTIONS - Wound Cleansing / Measurement X - Simple Wound Cleansing -  one wound 1 5 []  - 0 Complex Wound Cleansing - multiple wounds X- 1 5 Wound Imaging (photographs - any number of wounds) []  - 0 Wound Tracing (instead of photographs) X- 1 5 Simple Wound Measurement - one wound []  - 0 Complex Wound Measurement - multiple wounds INTERVENTIONS - Wound Dressings X - Small Wound Dressing one or multiple wounds 1 10 []  - 0 Medium Wound Dressing one or multiple wounds []  - 0 Large Wound Dressing one or multiple wounds []  - 0 Application of Medications - topical []  - 0 Application of Medications - injection INTERVENTIONS - Miscellaneous []  - External ear exam 0 []  - 0 Specimen Collection (cultures, biopsies, blood, body fluids, etc.) []  - 0 Specimen(s) / Culture(s) sent or taken to Lab for analysis []  - 0 Patient Transfer (multiple staff / Civil Service fast streamer / Similar devices) []  - 0 Simple Staple / Suture removal (25 or less) []  - 0 Complex Staple / Suture removal (26 or more) []  - 0 Hypo / Hyperglycemic Management (close monitor of Blood Glucose) []  - 0 Ankle / Brachial Index (ABI) - do not check if billed separately X- 1 5 Vital Signs [name], [name] ([id number]) Has the patient been seen at the hospital within the last three years: Yes Total Score: 85 Level Of Care: New/Established - Level 3 Electronic Signature(s) Signed: 07/18/2019 4:23:20 PM By: Harold Barban Entered By: Harold Barban on 07/18/2019 10:58:07 [name] ([id number]) -------------------------------------------------------------------------------- Lower Extremity Assessment Details Patient Name: [name] Date of Service: 07/18/2019 10:00 AM Medical Record Number: [id number] Patient  Account Number: [id number] Date of Birth/Sex: [birth year] (68 y.o. M) Treating RN: Harold Barban Primary Care Anup Brigham: [name] Other Clinician: Referring Iana Buzan: [name] Treating Ismael Treptow/Extender: Beverly Gust in Treatment: 24 Electronic Signature(s) Signed: 07/18/2019 10:56:21 AM By: Harold Barban Entered By: Harold Barban on 07/18/2019 10:56:20 BANYAN, TRACHT ([id number]) -------------------------------------------------------------------------------- Multi Wound Chart Details Patient Name: [name] Date of Service: 07/18/2019 10:00 AM Medical Record Number: [id number] Patient Account Number: [id number] Date of Birth/Sex: 12/16/1950 (68 y.o. M) Treating RN: Harold Barban Primary Care Zavier Canela: [name] Other Clinician: Referring Zadrian Mccauley: [name] Treating Elzie Sheets/Extender: Beverly Gust in Treatment: 33 Vital Signs Height(in): 73 Pulse(bpm): 72 Weight(lbs): 210 Blood Pressure(mmHg): 128/78 Body Mass Index(BMI): 28 Temperature(F): 98.0 Respiratory Rate 16 (breaths/min): Photos: [1:No Photos] [N/A:N/A] Wound Location: [1:Right Gluteal fold] [N/A:N/A] Wounding Event: [1:Pressure Injury] [N/A:N/A] Primary Etiology: [1:Pressure Ulcer] [N/A:N/A] Date Acquired: [1:11/02/2017] [N/A:N/A] Weeks of Treatment: [1:85] [N/A:N/A] Wound Status: [1:Open] [N/A:N/A] Measurements L x W x D [1:2x1.4x2.3] [N/A:N/A] (cm) Area (cm) : [1:2.199] [N/A:N/A] Volume (cm) : [1:5.058] [N/A:N/A] % Reduction in Area: [1:78.10%] [N/A:N/A] % Reduction in Volume: [1:-403.30% Category/Stage IV] [N/A:N/A N/A] Treatment Notes Electronic Signature(s) Signed: 07/18/2019 10:56:43 AM By: Harold Barban Entered By: Harold Barban on 07/18/2019 10:56:43 [name] ([id number]) -------------------------------------------------------------------------------- Multi-Disciplinary Care Plan Details Patient Name: [name] Date of Service: 07/18/2019 10:00 AM Medical Record Number: [id number] Patient Account Number: [id number] Date of Birth/Sex: 1951-02-12 (68 y.o. M) Treating RN: Harold Barban Primary Care Rontavious Albright: [name] Other Clinician: Referring Estuardo Frisbee: [name] Treating Chrystopher Stangl/Extender: Beverly Gust in Treatment: 18 Active Inactive Orientation to the Wound Care Program Nursing Diagnoses: Knowledge deficit related to the wound healing center program Goals: Patient/caregiver will verbalize understanding of the [place] Program Date Initiated: 11/30/2017 Target Resolution Date: 12/21/2017 Goal Status: Active Interventions: Provide education on orientation to the wound center Notes: Pressure Nursing Diagnoses: Knowledge deficit related to  management of pressures ulcers Goals: Patient/caregiver will verbalize understanding of pressure ulcer management Date Initiated: 05/17/2018 Target Resolution Date: 05/28/2018 Goal Status: Active Interventions: Assess: immobility, friction, shearing, incontinence upon admission and as needed Notes: Wound/Skin Impairment Nursing Diagnoses: Impaired tissue integrity Goals: Patient/caregiver will verbalize understanding of skin care regimen Date Initiated: 11/30/2017 Target Resolution Date: 12/21/2017 Goal Status: Active Ulcer/skin breakdown will have a volume reduction of 30% by week 4 Date Initiated: 11/30/2017 Target Resolution Date: 12/21/2017 Goal Status: Active NIVAN, MCKEAGUE ([id number]) Interventions: Assess patient/caregiver ability to obtain necessary supplies Assess patient/caregiver ability to perform ulcer/skin care regimen upon admission and as needed Assess ulceration(s) every visit Treatment Activities: Skin care regimen initiated : 11/30/2017 Notes: Electronic Signature(s) Signed: 07/18/2019 10:56:34 AM By: Harold Barban Entered By: Harold Barban on 07/18/2019 10:56:33 BLAIK, YOOS ([id number]) -------------------------------------------------------------------------------- Pain Assessment Details Patient Name: [name] Date of Service: 07/18/2019 10:00 AM Medical Record Number: [id number] Patient Account Number: [id number] Date of Birth/Sex: 11/11/1950 (68 y.o. M) Treating RN: Harold Barban Primary Care Chisa Kushner: [name] Other Clinician: Referring Darlene Brozowski: [name] Treating Shlok Raz/Extender: Beverly Gust in Treatment: 70 Active Problems Location of Pain Severity and Description of Pain Patient Has Paino No Site Locations Pain Management and Medication Current Pain Management: Electronic Signature(s) Signed: 07/18/2019 12:31:40 PM By: Lorine Bears RCP, RRT, CHT Signed: 07/18/2019 4:23:20 PM By: Harold Barban Entered By: Lorine Bears on 07/18/2019 10:21:57 SWANSON, KOSTOFF ([id number]) -------------------------------------------------------------------------------- Patient/Caregiver Education Details Patient Name: [name] Date of Service: 07/18/2019 10:00 AM Medical Record Number: [id number] Patient Account Number: [id number] Date of Birth/Gender: [birth year] (68 y.o. M) Treating RN: Harold Barban Primary Care Physician: [name] Other Clinician: Referring Physician: [name] Treating Physician/Extender: Beverly Gust in Treatment: 77 Education Assessment Education Provided To: Patient Education Topics Provided Pressure: Handouts: Pressure Ulcers: Care and Offloading Methods: Demonstration, Explain/Verbal Responses: State content correctly Electronic Signature(s) Signed: 07/18/2019 4:23:20 PM By: Harold Barban Entered By: Harold Barban on 07/18/2019 10:58:54 [name] ([id number]) -------------------------------------------------------------------------------- Wound Assessment Details Patient Name: [name] Date of Service: 07/18/2019 10:00 AM Medical Record Number: [id number] Patient Account Number: [id number] Date of Birth/Sex: March 02, 1951 (68 y.o. M) Treating RN: Harold Barban Primary Care Naif Alabi: [name] Other Clinician: Referring Sheena Simonis: [name] Treating Chayanne Speir/Extender: Beverly Gust in Treatment: 50 Wound Status Wound Number: 1 Primary Etiology: Pressure Ulcer Wound Location: Right Gluteal fold Wound Status: Open Wounding Event: Pressure Injury Comorbid History: Cataracts, Hypertension, Paraplegia Date Acquired: 11/02/2017 Weeks Of Treatment: 85 Clustered Wound: No Photos Wound Measurements Length: (cm) 2 Width: (cm) 1.4 Depth: (cm) 2.3 Area: (cm) 2.199 Volume: (cm) 5.058 % Reduction in Area: 78.1% % Reduction in Volume: -403.3% Epithelialization: Small (1-33%) Wound Description Classification: Category/Stage IV Foul Odor Wound Margin: Epibole Slough/Fib Exudate Amount: Large Exudate Type: Serous Exudate Color: amber After Cleansing: No rino Yes Wound Bed Granulation Amount: Large (67-100%) Exposed Structure Granulation Quality: Pink, Hyper-granulation Fascia Exposed: No Necrotic Amount: Small (1-33%) Fat Layer (Subcutaneous Tissue) Exposed: Yes Necrotic Quality: Adherent Slough Tendon Exposed: No Muscle Exposed: Yes Necrosis of Muscle: No Joint Exposed: No Bone Exposed: No DREVON, NEVINS ([id number]) Electronic Signature(s) Signed: 07/18/2019 4:23:20 PM By: Harold Barban Entered By: Harold Barban on 07/18/2019 11:37:59 [name] ([id number]) -------------------------------------------------------------------------------- Vitals Details Patient Name: [name] Date of Service: 07/18/2019 10:00 AM Medical Record Number: [id number] Patient Account Number: [id number] Date of Birth/Sex: 1951/04/29 (68 y.o. M) Treating RN: Harold Barban Primary Care Payslie Mccaig: [name] Other  Clinician: Referring Fawna Cranmer: [name] Treating Monta Police/Extender: Beverly Gust in Treatment: 27 Vital Signs Time Taken: 10:15 Temperature (F): 98.0 Height (in): 73 Pulse (bpm): 72 Weight (lbs): 210 Respiratory Rate (breaths/min): 16 Body Mass Index (BMI): 27.7 Blood Pressure (mmHg): 128/78 Reference Range: 80 - 120 mg / dl Electronic Signature(s) Signed: 07/18/2019 12:31:40 PM By: Lorine Bears RCP, RRT, CHT Entered By: Lorine Bears on 07/18/2019 10:22:24

## 2019-07-18 NOTE — Progress Notes (Signed)
ALVIS, LUBA ([id number]) Visit Report for 07/18/2019 Arrival Information Details Patient Name: NEWELL, OLANDER Date of Service: 07/18/2019 8:00 AM Medical Record Number: [id number] Patient Account Number: [id number] Date of Birth/Sex: 06-04-1951 (68 y.o. M) Treating RN: Harold Barban Primary Care Nivek Powley: [name] Other Clinician: [name] Referring Mckenzey Parcell: [name] Treating Fillmore Bynum/Extender: Beverly Gust in Treatment: 66 Visit Information History Since Last Visit Added or deleted any medications: No Patient Arrived: Wheel Chair Any new allergies or adverse reactions: No Arrival Time: 08:05 Had a fall or experienced change in No Accompanied By: wife activities of daily living that may affect Transfer Assistance: Harrel Lemon Lift risk of falls: Patient Identification Verified: Yes Signs or symptoms of abuse/neglect since last visito No Secondary Verification Process Completed: Yes Hospitalized since last visit: No Patient Requires Transmission-Based No Implantable device outside of the clinic excluding No Precautions: cellular tissue based products placed in the center Patient Has Alerts: Yes since last visit: Patient Alerts: NOT Has Dressing in Place as Prescribed: Yes Diabetic Pain Present Now: No Electronic Signature(s) Signed: 07/18/2019 12:31:40 PM By: Lorine Bears RCP, RRT, CHT Entered By: Lorine Bears on 07/18/2019 10:04:27 JOEB, MADOLE ([id number]) -------------------------------------------------------------------------------- Encounter Discharge Information Details Patient Name: [name] Date of Service: 07/18/2019 8:00 AM Medical Record Number: [id number] Patient Account Number: [id number] Date of Birth/Sex: 07/09/1951 (68 y.o. M) Treating RN: Harold Barban Primary Care Dejanee Thibeaux: [name] Other Clinician: [name] Referring Joleena Weisenburger: [name] Treating Dann Ventress/Extender: Beverly Gust in Treatment: 93 Encounter Discharge Information Items Discharge Condition: Stable Ambulatory Status: Wheelchair Discharge Destination: Home Transportation: Private Auto Accompanied By: wife Schedule Follow-up Appointment: Yes Clinical Summary of Care: Notes Patient has an HBO treatment scheduled on 07/19/2019 at 08:00 am. Electronic Signature(s) Signed: 07/18/2019 12:31:40 PM By: Lorine Bears RCP, RRT, CHT Entered By: Lorine Bears on 07/18/2019 10:21:04 NASIRE, ALBARES ([id number]) -------------------------------------------------------------------------------- Vitals Details Patient Name: [name] Date of Service: 07/18/2019 8:00 AM Medical Record Number: [id number] Patient Account Number: [id number] Date of Birth/Sex: Feb 02, 1951 (68 y.o. M) Treating RN: Harold Barban Primary Care Chip Canepa: [name] Other Clinician: [name] Referring Defne Gerling: [name] Treating Moshe Wenger/Extender: Beverly Gust in Treatment: 52 Vital Signs Time Taken: 08:17 Temperature (F): 98.7 Height (in): 73 Pulse (bpm): 72 Weight (lbs): 210 Respiratory Rate (breaths/min): 16 Body Mass Index (BMI): 27.7 Blood Pressure (mmHg): 106/62 Reference Range: 80 - 120 mg / dl Electronic Signature(s) Signed: 07/18/2019 12:31:40 PM By: Lorine Bears RCP, RRT, CHT Entered By: Lorine Bears on 07/18/2019 10:05:00

## 2019-07-19 ENCOUNTER — Encounter: Payer: Medicare Other | Admitting: Internal Medicine

## 2019-07-19 DIAGNOSIS — L89314 Pressure ulcer of right buttock, stage 4: Secondary | ICD-10-CM | POA: Diagnosis not present

## 2019-07-19 NOTE — Progress Notes (Signed)
RONDELL, JOHNKE ([id number]) Visit Report for 07/19/2019 Arrival Information Details Patient Name: LIN, KROOK Date of Service: 07/19/2019 8:00 AM Medical Record Number: [id number] Patient Account Number: [id number] Date of Birth/Sex: 1951/05/26 (68 y.o. M) Treating RN: Montey Hora Primary Care Jonaven Hilgers: [name] Other Clinician: [name] Referring Manaia Samad: [name] Treating Ambry Dix/Extender: Beverly Gust in Treatment: 31 Visit Information History Since Last Visit Added or deleted any medications: No Patient Arrived: Wheel Chair Any new allergies or adverse reactions: No Arrival Time: 08:05 Had a fall or experienced change in No Accompanied By: wife activities of daily living that may affect Transfer Assistance: Harrel Lemon Lift risk of falls: Patient Identification Verified: Yes Signs or symptoms of abuse/neglect since last visito No Secondary Verification Process Completed: Yes Hospitalized since last visit: No Patient Requires Transmission-Based No Implantable device outside of the clinic excluding No Precautions: cellular tissue based products placed in the center Patient Has Alerts: Yes since last visit: Patient Alerts: NOT Has Dressing in Place as Prescribed: Yes Diabetic Pain Present Now: No Electronic Signature(s) Signed: 07/19/2019 10:29:09 AM By: Lorine Bears RCP, RRT, CHT Entered By: Lorine Bears on 07/19/2019 08:27:35 ZACKAREE, CALLERY ([id number]) -------------------------------------------------------------------------------- Encounter Discharge Information Details Patient Name: [name] Date of Service: 07/19/2019 8:00 AM Medical Record Number: [id number] Patient Account Number: [id number] Date of Birth/Sex: [birth year] (68 y.o. M) Treating RN: Montey Hora Primary Care Margorie Renner: [name] Other Clinician: [name] Referring Magaby Rumberger: [name] Treating Yamna Mackel/Extender: Beverly Gust in Treatment: 25 Encounter Discharge Information Items Discharge Condition: Stable Ambulatory Status: Wheelchair Discharge Destination: Home Transportation: Private Auto Accompanied By: wife Schedule Follow-up Appointment: Yes Clinical Summary of Care: Notes Patient has an HBO treatment scheduled on 07/20/2019 at 08:00 am. Electronic Signature(s) Signed: 07/19/2019 10:29:09 AM By: Lorine Bears RCP, RRT, CHT Entered By: Lorine Bears on 07/19/2019 10:28:47 [name], EVETTS ([id number]) -------------------------------------------------------------------------------- Vitals Details Patient Name: [name] Date of Service: 07/19/2019 8:00 AM Medical Record Number: [id number] Patient Account Number: [id number] Date of Birth/Sex: 11-06-1950 (68 y.o. M) Treating RN: Montey Hora Primary Care Brixton Franko: [name] Other Clinician: [name] Referring Angelus Hoopes: [name] Treating [place]/Extender: Beverly Gust in Treatment: 22 Vital Signs Time Taken: 08:18 Temperature (F): 98.9 Height (in): 73 Pulse (bpm): 72 Weight (lbs): 210 Respiratory Rate (breaths/min): 16 Body Mass Index (BMI): 27.7 Blood Pressure (mmHg): 116/64 Reference Range: 80 - 120 mg / dl Electronic Signature(s) Signed: 07/19/2019 10:29:09 AM By: Lorine Bears RCP, RRT, CHT Entered By: Becky Sax, Amado Nash on 07/19/2019 08:28:01

## 2019-07-19 NOTE — Progress Notes (Addendum)
[name] ([id number]) Visit Report for 07/18/2019 HPI Details Patient Name: [name], [name] Date of Service: 07/18/2019 10:00 AM Medical Record Number: [id number] Patient Account Number: [id number] Date of Birth/Sex: 1950-09-14 (68 y.o. 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 He states that there is no history of diabetes that he is aware of. He does have issues with his prostate and is currently receiving treatment for this by way of oral medication. With that being said I do not have a lot of details in that regard. Nonetheless the patient presents today as a result of having been referred to [place] by another provider initially home health was set to come out and take care of his wound although due to the fact that he apparently drives he's not able to receive home health. His wife is therefore trying to help take care of this wound within although they have been struggling with what exactly to do at this point. She states that she can do some things but she is definitely not a nurse and does have some issues with looking at blood. The good news is the wound does not appear to be too deep and is fairly superficial at this point. There is no slough noted there is some nonviable skin noted around the surface of the wound and the perimeter at this point. The central portion of the wound appears to be very good with a dermal layer noted this does not appear to be again deep enough to extend it to subcutaneous tissue at this point.  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 In the pump he has Dilaudid and baclofen. 12/07/17 on evaluation today patient presents for follow-up concerning his ongoing lower back thigh ulcer on the right. He states that he did not get the supplies ordered and therefore has not really been able to perform the dressing changes as directed exactly. His wife was able to get some Boarder Foam Dressing's from the drugstore and subsequently has been using hydrogel which did help to a degree in the wound does appear to be able smaller. There is actually more drainage this week noted than previous. 12/21/17 on evaluation today patient appears to be doing rather well in regard to his right gluteal ulcer. He has been tolerating the dressing changes without complication. There does not appear to be any evidence of infection at this point in time. Overall the wound does seem to be making some progress as far as the edges are concerned there's not as much in the way of overlapping of the external wound edges and he has a good epithelium to wound bed border for the most part. This however is not true right at the 12 o'clock location over the span of a little over a centimeters which actually will require debridement today to clean this away and hopefully allow it to continue to heal more appropriately. 12/28/17 on evaluation today patient appears to be doing rather well in regard to his ulcer in the left  gluteal region. He's been tolerating the dressing changes without complication. Apparently he has had some difficulty getting his dressing material. Apparently there's been some confusion with ordering we're gonna check into this. Nonetheless overall he's been showing signs of improvement which is good news. Debridement is not required today. 01/04/18 on evaluation today patient presents for follow-up concerning  his right gluteal ulcer. He has been tolerating the dressing changes fairly well. On inspection today it appears he may actually have some maceration them concerned about the fact that he may be developing too much moisture in and around the wound bed which can cause delay in healing. With that being said he unfortunately really has not showed significant signs of improvement since last week's evaluation in fact this may even be just the little bit/slightly larger. Nonetheless he's been having a lot of discomfort I'm not sure this is even related to the wound as he has no pain when I'm to breeding or otherwise cleaning the wound during evaluation today. Nonetheless this is something that we did recommend he talked to his pain specialist concerning. 01/11/18 on evaluation today patient appears to be doing better in regard to his ulceration. He has been tolerating the dressing changes without complication. With that being said overall there's no evidence of infection which is good news. The only thing [name], [name] ([id number]) is he did receive the hatch affair blue classic versus the ready nonetheless I feel like this is perfectly fine and appears to have done well for him over the past week. 01/25/18 on evaluation today patient's wound actually appears to be a little bit larger than during the last evaluation. The good news is the majority of the wound edges actually appear to be fairly firmly attached to the wound bed unfortunately again we're not really making progress in regard to the size. Roughly the wound is about the same size as when I first saw him although again the wound margin/edges appear to be much better. 02/01/18 on evaluation today patient actually appears to be doing very well in regard to his wound. Applying the Prisma dry does seem to be better although he does still have issues with slow progression of the wound. There was a slight improvement compared to last week's  measurements today. Nonetheless I have been considering other options as far as the possibility of Theraskin or even a snap vac. In general I'm not sure that the Theraskin due to location of the wound would be a very good idea. Nonetheless I do think that a snap vac could be a possibility for the patient and in fact I think this could even be an excellent way to manage the wound possibly seeing some improvement in a very rapid fashion here. Nonetheless this is something that we would need to get approved and I did have a lengthy conversation with the patient about this today. 02/08/18 on evaluation today patient appears to be doing a little better in regard to his ulcer. He has been tolerating the dressing changes without complication. Fortunately despite the fact that the wound is a little bit smaller it's not significantly so unfortunately. We have discussed the possibility of a snap vac we did check with insurance this is actually covered at this point. Fortunately there does not appear to be any sign of infection. Overall I'm fairly pleased with how things seem to be appearing at this point. 02/15/18 on evaluation today patient appears to be doing rather well in regard to his right gluteal  ulcer. Unfortunately the snap vac did not stay in place with his sheer and friction this came loose and did not seem to maintain seal very well. He worked for about two days and it did seem to do very well during that time according to his wife but in general this does not seem to be something that's gonna be beneficial for him long-term. I do believe we need to go back to standard dressings to see if we can find something that will be of benefit. 03/02/18- He is here in follow up evaluation; there is minimal change in the wound. He will continue with the same treatment plan, would consider changing to iodosrob/iodoflex if ulcer continues to to plateau. He will follow up next week 03/08/18 on evaluation today  patient's wound actually appears to be about the same size as when I previously saw him several weeks back. Unfortunately he does have some slightly dark discoloration in the central portion of the wound which has me concerned about pressure injury. I do believe he may be sitting for too long a period of time in fact he tells me that "I probably sit for much too long". He does have some Slough noted on the surface of the wound and again as far as the size of the wound is concerned I'm really not seeing anything that seems to have improved significantly. 03/15/18 on evaluation today patient appears to be doing fairly well in regard to his ulcer. The wound measured pretty much about the same today compared to last week's evaluation when looking at his graph. With that being said the area of bruising/deep tissue injury that was noted last week I do not see at this point. He did get a new cushion fortunately this does seem to be have been of benefit in my pinion. It does appear that he's been off of this more which is good news as well I think that is definitely showing in the overall wound measurements. With that being said I do believe that he needs to continue to offload I don't think that the fact this is doing better should be or is going to allow him to not have to offload and explain this to him as well. Overall he seems to be in agreement the plan I think he understands. The overall appearance of the wound bed is improved compared to last week I think the Iodoflex has been beneficial in that regard. 03/29/18 on evaluation today patient actually appears to be doing rather well in regard to his wound from the overall appearance standpoint he does have some granulation although there's some Slough on the surface of the wound noted as well. With that being said he unfortunately has not improved in regard to the overall measurement of the wound in volume or in size. I did have a discussion with him very  specifically about offloading today. He actually does work although he mainly is just sitting throughout the day. He tells me he offloads by "lifting himself up for 30 seconds off of his chair occasionally" purchase from [employer] which does seem to have helped. And he has a new cushion that he with that being said he's also able to stand some for a very short period of time but not significant enough I think to provide appropriate offloading. I think the biggest issue at this point with the wound and the fact is not healing as quickly as we would like is due to the fact that he  is really not able to appropriately offload while at work. He states the beginning after his injury he actually had a bed at his job that he could lay on in order to offload and that does seem to have been of help back at that time. Nonetheless he had not done this in quite some time unfortunately. I think that could be helpful for him this is something I would like for him to look into. 04/05/18 on evaluation today patient actually presents for follow-up concerning his right gluteal ulcer. Again he really is not [name], [name]. ([id number]) significantly improved even compared to last week. He has been tolerating the dressing changes without complication. With that being said fortunately there appears to be no evidence of infection at this time. He has been more proactive in trying to offload. 04/12/18 on evaluation today patient actually appears to be doing a little better in regard to his wound and the right gluteal fold region. He's been tolerating the dressing changes since removing the oasis without complication. However he was having a lot of burning initially with the oasis in place. He's unsure of exactly why this was given so much discomfort but he assumes that it was the oasis itself causing the problem. Nonetheless this had to be removed after about three days in place although even those three days  seem to have made a fairly good improvement in regard to the overall appearance of the wound bed. In fact is the first time that he's made any improvement from the standpoint of measurements in about six weeks. He continues to have no discomfort over the area of the wound itself which leads me to wonder why he was having the burning with the oasis when he does not even feel the actual debridement's themselves. I am somewhat perplexed by this. 04/19/18 on evaluation today patient's wound actually appears to be showing signs of epithelialization around the edge of the wound and in general actually appears to be doing better which is good news. He did have the same burning after about three days with applying the Endoform last week in the same fashion that I would generally apply a skin substitute. This seems to indicate that it's not the oasis to cause the problem but potentially the moisture buildup that just causes things to burn or there may be some other reaction with the skin prep or Steri-Strips. Nonetheless I'm not sure that is gonna be able to tolerate any skin substitute for a long period of time. The good news is the wound actually appears to be doing better today compared to last week and does seem to finally be making some progress. 04/26/18 on evaluation today patient actually appears to be doing rather well in regard to his ulcer in the right gluteal fold. He has been tolerating the dressing changes without complication which is good news. The Endoform does seem to be helping although he was a little bit more macerated this week. This seems to be an ongoing issue with fluid control at this point. Nonetheless I think we may be able to add something like Drawtex to help control the drainage. 05/03/18 on evaluation today patient appears to actually be doing better in regard to the overall appearance of his wound. He has been tolerating the dressing changes without complication. Fortunately  there appears to be no evidence of infection at this time. I really feel like his wound has shown signs as of today of turning around last week I thought  so as well and definitely he could be seen in this week's overall appearance and measurements. In general I'm very pleased with the fact that he finally seems to be making a steady but sure progress. The patient likewise is very pleased. 05/17/18 on evaluation today patient appears to be doing more poorly unfortunately in regard to his ulcer. He has been tolerating the dressing changes without complication. With that being said he tells me that in the past couple of days he and his wife have noticed that we did not seem to be doing quite as well is getting dark near the center. Subsequently upon evaluation today the wound actually does appear to be doing worse compared to previous. He has been tolerating the dressing changes otherwise and he states that he is not been sitting up anymore than he was in the past from what he tells me. Still he has continued to work he states "I'm tired of dealing with this and if I have to just go home and lay in the bed all the time that's what I'll do". Nonetheless I am concerned about the fact that this wound does appear to be deeper than what it was previous. 05/24/18 upon evaluation today patient actually presents after having been in the hospital due to what was presumed to be sepsis secondary to the wound infection. He had an elevated white blood cell count between 14 and 15. With that being said he does seem to be doing somewhat better now. His wound still is giving him some trouble nonetheless and he is obviously concerned about the fact likely talked about that this does seem to go more deeply than previously noted. I did review his wound culture which showed evidence of Staphylococcus aureus him and group B strep. Nonetheless he is on antibiotics, Levaquin, for this. Subsequently I did review his intake  summary from the hospital as well. I also did look at the CT of the lumbar spine with contrast that was performed which showed no bone destruction to suggest lumbar disguises/osteomyelitis or sacral osteomyelitis. There was no paraspinal abscess. Nonetheless it appears this may have been more of just a soft tissue infection at this point which is good news. He still is nonetheless concerned about the wound which again I think is completely reasonable considering everything he's been through recently. 05/31/18 on evaluation today on evaluation today patient actually appears to be showing signs of his wound be a little bit deeper than what I would like to see. Fortunately he does not show any signs of significant infection although his temperature was 99 today he states he's been checking this at home and has not been elevated. Nonetheless with the undermining that I'm seeing at this point I am becoming more concerned about the wound I do think that offloading is a key factor here that is preventing the speedy recovery at this point. There does not appear to be any evidence of again over infection noted. He's been using Santyl currently. 06/07/18 the patient presents today for follow-up evaluation regarding the left ulcer in the gluteal region. He has been [name], [name]. ([id number]) tolerating the Wound VAC fairly well. He is obviously very frustrated with this he states that to mean is really getting in his way. There does not appear to be any evidence of infection at this time he does have a little bit of odor I do not necessarily associate this with infection just something that we sometimes notice with Wound VAC therapy. With  that being said I can definitely catch a tone of discontentment overall in the patient's demeanor today. This when he was previously in the hospital an CT scan was done of the lumbar region which did not reveal any signs of osteomyelitis. With that being said the pelvis  in particular was not evaluated distinctly which means he could still have some osteonecrosis I. Nonetheless the Wound VAC was started on Thursday I do want to get this little bit more time before jumping to a CT scan of the pelvis although that is something that I might would recommend if were not see an improvement by that time. 06/14/18 on evaluation today patient actually appears to be doing about the same in regard to his right gluteal ulcer. Again he did have a CT scan of the lumbar spine unfortunately this did not include the pelvis. Nonetheless with the depth of the wound that I'm seeing today even despite the fact that I'm not seeing any evidence of overt cellulitis I believe there's a good chance that we may be dealing with osteomyelitis somewhere in the right Ischial region. No fevers, chills, nausea, or vomiting noted at this time. 06/21/18 on evaluation today patient actually appears to be doing about the same with regard to his wound. The tunnel at 6 o'clock really does not appear to be any deeper although it is a little bit wider. I think at this point you may want to start packing this with white phone. Unfortunately I have not got approval for the CT scan of the pelvis as of yet due to the fact that Medicare apparently has been denied it due to the diagnosis codes not being appropriate according to Medicare for the test requested. With that being said the patient cannot have an MRI and therefore this is the only option that we have as far as testing is concerned. The patient has had infection and was on antibiotics and been added code for cellulitis of the bottom to see if this will be appropriate for getting the test approved. Nonetheless I'm concerned about the infection have been spread deeper into the Ischial region. 06/28/18 on evaluation today patient actually appears to be doing rather well all things considered in regard to the right gluteal ulcer. He has been tolerating the  dressing changes without complication. With that being said the Wound VAC he states does have to be replaced almost every day or at least reinforced unfortunately. Patient actually has his CT scan later this morning we should have the results by tomorrow. 07/05/18 on evaluation today patient presents for follow-up concerning his right Ischial ulcer. He did see the surgeon Dr. Lysle Pearl last week. They were actually very happy with him and felt like he spent a tremendous amount of time with them as far as discussing his situation was concerned. In the end Dr. Lysle Pearl did contact me as well and determine that he would not recommend any surgical intervention at this point as he felt like it would not be in the patient's best interest based on what he was seeing. He recommended a referral to infectious disease. Subsequently this is something that Dr. Ines Bloomer office is working on setting up for the patient. As far as evaluation today is concerned the patient's wound actually appears to be worse at this point. I am concerned about how things are progressing and specifically about infection. I do not feel like it's the deeper but the area of depth is definitely widening which does have me concerned.  No fevers, chills, nausea, or vomiting noted at this time. I think that we do need initiate antibiotic therapy the patient has an allow allergy to amoxicillin/penicillin he states that he gets a rash since childhood. Nonetheless she's never had the issues with Catholics or cephalosporins in general but he is aware of. 07/27/18 on evaluation today patient presents following admission to the hospital on 07/09/18. He was subsequently discharged on 07/20/18. On 07/15/18 the patient underwent irrigation and debridement was soft tissue biopsy and bone biopsy as well as placement of a Wound VAC in the OR by Dr. Celine Ahr. During the hospital course the patient was placed on a Wound VAC and recommended follow up with surgery in  three weeks actually with Dr. Delaine Lame who is infectious disease. The patient was on vancomycin during the hospital course. 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 He also had a bone culture which revealed evidence of methicillin-resistant staph aureus. He is updated CT scan 07/09/18 reveals that he had progression of the which was performed on wound to breakdown down to the trochanter where he actually had irregularities there as well suggestive of osteomyelitis. This was a change just since 9 December when we last performed a CT scan. Obviously this one had gone downhill quite significantly and rapidly. At this point upon evaluation I feel like in general the patient's wound seems to be doing fairly well all things considered upon my evaluation today. Obviously this is larger and deeper than what I previously evaluated but at the same time he seems to be making some progress as far as the appearance of the granulation tissue is concerned. I'm happy in that regard. No fevers, chills, nausea, or vomiting noted at this time. He is on IV vancomycin and Rocephin at the facility. He is currently in [employer]. 08/03/18 upon evaluation today patient's wound appears to be doing better in regard to the overall appearance at this point in time. Fortunately he's been tolerating the Wound VAC without complication and states that the facility has been taking excellent care of the wound site. Overall I see some Slough noted on the surface which I am going to attempt sharp JULIANNA, CUARTAS. ([id number]) debridement today of but nonetheless other than this I feel like he's making progress. 08/09/18 on evaluation today patient's wound appears to be doing much better compared to even last week's evaluation. Do believe that the Wound VAC is been of great benefit for him. He has been tolerating the dressing changes that is the Wound VAC without any complication and he  has excellent granulation noted currently. There is no need for sharp debridement at this point. 08/16/18 on evaluation today patient actually appears to be doing very well in regard to the wound in the right gluteal fold region. This is showing signs of progress and again appears to be very healthy which is excellent news. Fortunately there is no sign of active infection by way of odor or drainage at this point. Overall I'm very pleased with how things stand. He seems to be tolerating the Wound VAC without complication. 08/23/18 on evaluation today patient actually appears to be doing better in regard to his wound. He has been tolerating the Wound VAC without complication and in fact it has been collecting a significant amount of drainage which I think is good news especially considering how the wound appears. Fortunately there is no signs of infection at this time definitely nothing appears to be worse which  is good news. He has not been started on the Bactrim and Flagyl that was recommended by Dr. Delaine Lame yet. I did actually contact her office this morning in order to check and see were things are that regard their gonna be calling me back. 08/30/18 on evaluation today patient actually appears to show signs of excellent improvement today compared to last evaluation. The undermining is getting much better the wound seems to be feeling quite nicely and I'm very pleased that the granulation in general. With that being said overall I feel like the patient has made excellent progress which is great news. No fevers, chills, nausea, or vomiting noted at this time. 09/06/18 on evaluation today patient actually appears to be doing rather well in regard to his right gluteal ulcer. This is showing signs of improvement in overall I'm very pleased with how things seem to be progressing. The patient likewise is please. Overall I see no evidence of infection he is about to complete his oral antibiotic regimen  which is the end of the antibiotics for him in just about three days. 09/13/18 on evaluation today patient's right Ischial ulcer appears to be showing signs of continued improvement which is excellent news. He's been tolerating the dressing changes without complication. Fortunately there's no signs of infection and the wound that seems to be doing very well. 09/28/18 on evaluation today patient appears to be doing rather well in regard to his right Ischial ulcer. He's been tolerating the Wound VAC without complication he knows there's much less drainage than there used to be this obviously is not a bad thing in my pinion. There's no evidence of infection despite the fact is but nothing about it now for several weeks. 10/04/18 on evaluation today patient appears to be doing better in regard to his right Ischial wound. He has been tolerating the Wound VAC without complication and I do believe that the silver nitrate last week was beneficial for him. Fortunately overall there's no evidence of active infection at this time which is great news. No fevers, chills, nausea, or vomiting noted at this time. 10/11/18 on evaluation today patient actually appears to be doing rather well in regard to his Ischial ulcer. He's been tolerating the Wound VAC still without complication I feel like this is doing a good job. No fevers, chills, nausea, or vomiting noted at this time. 11/01/18 on evaluation today patient presents after having not been seen in our clinic for several weeks secondary to the fact that he was on evaluation today patient presents after having not been seen in our clinic for several weeks secondary to the fact that he was in a skilled nursing facility which was on lockdown currently due to the covert 19 national emergency. Subsequently he was discharged from the facility on this past Friday and subsequently made an appointment to come in to see yesterday. Fortunately there's no signs of active  infection at this time which is good news and overall he does seem to have made progress since I last saw. Overall I feel like things are progressing quite nicely. The patient is having no pain. 11/08/18 on evaluation today patient appears to be doing okay in regard to his right gluteal ulcer. He has been utilizing a Wound VAC home health this changing this at this point since he's home from the skilled nursing facility. Fortunately there's no signs of obvious active infection at this time. Unfortunately though there's no obvious active infection he is having some maceration and his  wife states that when the sheets of the Wound VAC office on Sunday when it broke seal that he ended up having significant issues with some smell as well there concerned about the possibility of infection. Fortunately there's No fevers, chills, nausea, or vomiting noted at this time. [name], [name] ([id number]) 11/15/18 on evaluation today patient actually appears to be doing well in regard to his right gluteal ulcer. He has been tolerating the dressing changes without complication. Specifically the Wound VAC has been utilized up to this point. Fortunately there's no signs of infection and overall I feel like he has made progress even since last week when I last saw him. I'm actually fairly happy with the overall appearance although he does seem to have somewhat of a hyper granular overgrowth in the central portion of the wound which I think may require some sharp debridement to try flatness out possibly utilizing chemical cauterization following. 11/23/18 on evaluation today patient actually appears to be doing very well in regard to his sacral ulcer. He seems to be showing signs of improvement with good granulation. With that being said he still has the small area of hyper granulation right in the central portion of the wound which I'm gonna likely utilize silver nitrate on today. Subsequently he also keeps having  a leak at the 6 o'clock location which is unfortunate we may be able to help out with some suggestions to try to prevent this going forward. Fortunately there's no signs of active infection at this time. 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 He's been tolerating the dressing changes without complication. Fortunately there's no signs of active infection at this time. I've been rather pleased with how things have progressed there still some evidence of pressure getting to the area with some redness right around the immediate wound opening. Nonetheless other than this I'm not seeing any significant complications or issues the wound is somewhat hyper granular. Upon discussing with the patient and his wife today I'm not sure that the wound is being packed to the base with the foam at this point. And if it's not been packed fully that may be part of the reason why is not seen as much improvement as far as the granulation from the base out. Again we do not want pack too tightly but we need some of the firm to get to the base of the wound. I discussed this with patient and his wife today. 12/06/18 on evaluation today patient appears to be doing well in regard to his right gluteal pressure ulcer. He's been tolerating the dressing changes without complication. Fortunately there's no signs of active infection. He still has some hyper granular tissue and I do think it would be appropriate to continue with the chemical cauterization as of today. 12/16/18 on evaluation today patient actually appears to be doing okay in regard to his right gluteal ulcer. He is been tolerating the dressing changes without complication including the Wound VAC. Overall I feel like nothing seems to be worsening I do feel like that the hyper granulation buds in the central portion of the wound have improved to some degree with the  silver nitrate. We will have to see how things continue to progress. 12/20/18 on evaluation today patient actually appears to be doing much worse in my pinion even compared to last week's evaluation. Unfortunately as opposed to showing any signs of improvement  the areas of hyper granular tissue in the central portion of the wound seem to be getting worse. Subsequently the wound bed itself also seems to be getting deeper even compared to last week which is both unusual as well as concerning since prior he had been shown signs of improvement. Nonetheless I think that the issue could be that he's actually having some difficulty in issues with a deeper infection. There's no external signs of infection but nonetheless I am more worried about the internal, osteomyelitis, that could be restarting. He has not been on antibiotics for some time at this point. I think that it may be a good idea to go ahead and started back on an antibiotic therapy while we wait to see what the testing shows. 12/27/18 on evaluation today patient presents for follow-up concerning his left gluteal fold wound. Fortunately he appears to be doing well today. I did review the CT scan which was negative for any signs of osteomyelitis or acute abnormality this is excellent news. Overall I feel like the surface of the wound bed appears to be doing significantly better today compared to previously noted findings. There does not appear any signs of infection nor does he have any pain at this time. 01/03/19 on evaluation today patient actually appears to be doing quite well in regard to his ulcer. Post debridement last week he really did not have too much bleeding which is good news. Fortunately today this seems to be doing some better but we still has some of the hyper granular tissue noted in the base of the wound which is gonna require sharp debridement today as well. Overall I'm pleased with how things seem to be progressing since we  switched away from the Wound VAC I think he is making some progress. 01/10/19 on evaluation today patient appears to be doing better in regard to his right gluteal fold ulcer. He has been tolerating the dressing changes without complication. The debridement to seem to be helping with current away some of the poor hyper granular tissue bugs throughout the region of his gluteal fold wound. He's been tolerating the dressing changes otherwise without complication which is great news. No fevers, chills, nausea, or vomiting noted at this time. 01/17/19 on evaluation today patient actually appears to be doing excellent in regard to his wound. He's been tolerating the dressing changes without complication. Fortunately there is no signs of active infection at this time which is great news. No fevers, chills, nausea, or vomiting noted at this time. [name], [name] ([id number]) 01/24/19 on evaluation today patient actually appears to be doing quite well with regard to his ulcer. He has been tolerating the dressing changes without complication. Fortunately there's no signs of active infection at this time. Overall been very pleased with the progress that he seems to be making currently. 01/31/19-Patient returns at 1 week with apparent similarity in dimensions to the wound, with no signs of infection, he has been changing dressings twice a day 02/08/19 upon evaluation today patient actually appears to be doing well with regard to his right Ischial ulcer. The wound is not appear to be quite as deep and seems to be making progress which is good news. With that being said I'm still reluctant to go back to the Wound VAC at this point. He's been having to change the dressings twice a day which is a little bit much in my pinion from the wound care supplies standpoint. I think that possibly attempting to utilize extras  orbit may be beneficial this may also help to prevent any additional breakdown secondary to fluid  retention in the wound itself. The patient is in agreement with giving this a try. 02/15/19 on evaluation today patient actually appears to be doing decently well with regard to his ulcer in the right to gluteal fold location. He's been tolerating the dressing changes without complication. Fortunately there is no signs of active infection at this time. He is able to keep the current dressing in place more effectively for a day at a time whereas before he was having a changes to to three times a day. The actions or has been helpful in this regard. Fortunately there's no signs of anything getting worse and I do feel like he showing signs of good improvement with regard to the wound bed status. 02/22/2019 patient appears to be doing very well today with regard to his ulcer in the gluteal fold. Fortunately there is no signs of active infection and he has been tolerating the dressing changes without any complication. Overall extremely pleased with how things seem to be progressing. He has much less of the hyper granular projections within the wound these have slowly been debrided away and he seems to be doing well. The wound bed is more uniform. 03/01/19 on evaluation today patient appears to be doing unfortunately about the same in regard to his gluteal ulcer. He's been tolerating the dressing changes without complication. Fortunately there's no signs of active infection at this time. With that being said he continues to develop these hyper granular projections which I'm unsure of exactly what they are and why they are rising. Nonetheless I explained to the patient that I do believe it would be a good idea for [place] to stand a biopsy sample for pathology to see if that can shed any light on what exactly may be going on here. Fortunately I do not see any obvious signs of infection. With that being said the patient has had a little bit more drainage this week apparently compared to last week. 03/08/2019 on  evaluation today patient actually appears to look somewhat better with regard to the appearance of his wound bed at this time. This is good news. Overall I am very pleased with how things seem to have progressed just in the past week with a switch to the [facility] dressing. I think that has been beneficial for him. With that being said at this time the patient is concerned about his biopsy that I sent off last week unfortunately I do not have that report as of yet. Nonetheless we have called to obtain this and hopefully will hear back from the lab later this morning. 03/15/19 on evaluation today patient's wound actually appears to be doing okay today with regard to the overall appearance of the wound bed. He has been tolerating the dressing changes without complication he still has hyper granular tissue noted but fortunately that seems to be minimal at this point compared to some of what we've seen in the past. Nonetheless I do think that he is still having some issues currently with some of this type of granulation the biopsy and since all showed nothing more than just evidence of granulation tissue. Therefore there really is nothing different to initiate or do at this point. 03/24/19 on evaluation today patient appears to be doing a little better with regard to his ulcer. He's been tolerating the dressing changes without complication. Fortunately there is no signs of active infection at  this time. No fevers, chills, nausea, or vomiting noted at this time. I'm overall pleased with how things seem to be progressing. 03/29/2019 on evaluation today patient appears to be doing about the same in regard to his ulcer in the right gluteal fold. Unfortunately he is not seeming to make a lot of progress and the wound is somewhat stalled. There is no signs of active infection externally but I am concerned about the possibility of infection continuing in the ischial location which previously he did have  infection noted. Again is not able to have an MRI so probably our best option for testing for this would be a triple phase bone scan which will detect subtle changes in the bone more so than plain film x-rays. In the past they really have not been beneficial and in fact the CT scans even have been somewhat questionable at times. Nonetheless there is no signs of systemic infection which is at least good news but again his wound is not healing at all on the predicted schedule. 04/05/19 on evaluation today patient appears to be doing well all things considering with regard to his wound and the right gluteal fold. He actually has his triple bone scan scheduled for sometime in the next couple of days. With that being said I've also been looking to other possibilities of what could be causing hyper granular tissue were looking into the bone scan again for evaluating for the risk or possibility of infection deeper and I'm also gonna go ahead and see about obtaining a deep tissue culture today to send and see if there's any evidence of infection noted on culture. He's in agreement with that plan. [name], [name] ([id number]) 04/12/2019 on evaluation today patient presents for reevaluation here in our clinic concerning ongoing issues with his right gluteal fold ulcer. 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 It was discussed with him at that point that I think it would be appropriate for him to proceed with hyperbaric oxygen therapy especially in light of the fact that he is previously been on IV antibiotics at the beginning of the year for close to 3 months followed by several weeks of oral antibiotics that was all prescribed by infectious disease. He had surgical debridement around Christmas December 2019 due to an abscess and osteomyelitis of the ischial bone. Unfortunately this has  not really proceeded as well as we would have liked and again we did a CT scan even a couple months ago as he cannot have an MRI secondary to having issues with both a pain pump as well as a spinal cord stimulator which prevent him from going into an MRI machine. With that being said there were chronic changes noted at the ischial tuberosity which had progressed since December 2019 there was no evidence of fluid collection on that initial CT scan. With that being said that was on January 21, 2019. I am not sure that it was a sensitive enough test as compared to an MRI and then subsequently I ordered a bone scan for him which was actually completed on 03/29/2019 and this revealed that he does indeed have positive osteomyelitis involving the right ischial tuberosity. This is adjacent to the ulcer and I think is the reason that his ulcer is not healing. Subsequently I am in a place him back on oral antibiotics today unfortunately his wound culture showed just  mixed gram-negative flora with no specific findings of a predominant organism. Nonetheless being with the location I think a good broad-spectrum antibiotic for gram-negative's is a good choice at this point he is previously taken Levaquin without significant issues and I think that is an appropriate antibiotic for him at this time. 04/19/2019 on evaluation today patient actually appears to be doing fairly well with regard to his wound. He has been tolerating the dressing changes without complication. Fortunately there is no signs of active infection and he has been taking the oral antibiotics at this time. Subsequently we did make a referral to infectious disease although Dr. Steva Ready wants the patient to be seen by general surgery first in order apparently to see if there is anything from a surgical standpoint that should be done prior to initiation of IV antibiotic therapy. Again the patient is okay with actually seeing Dr. Celine Ahr whom he has seen  before we will make that referral. 04/26/2019 on evaluation today patient actually appears to be doing well with regard to his wound. He has been tolerating the dressing changes without complication. Fortunately there is no signs of active infection at this time. I do believe that the hyperbaric oxygen therapy along with the antibiotics which I prescribed at this point have been doing well for him. With that being said he has not seen Dr. Celine Ahr the surgeon yet in fact they have not been contacted for scheduling an appointment as of yet. Subsequently the patient is not on antibiotics currently by IV Dr. Steva Ready did want him to see Dr. Celine Ahr first which we are working on trying to get scheduled. We again give the information to the patient today for Dr. Celine Ahr and her number as far as contacting their office to see about getting something scheduled. Again were looking for whether or not they recommend any surgical intervention. 05/03/2019 on evaluation today patient appears to be doing about the same with regard to his wound there may be a little bit of filling in in regard to the base of the wound but still he has quite a bit of hyper granular tissue that I am seeing today. I believe that he may may need a more aggressive sharp debridement possibly even by the surgeon or under anesthesia in order to clear away some of the hyper granular material in order to help allow for appropriate granulation to fill in. We have made a referral to Dr. Celine Ahr unfortunately it sounds as if they did not receive the referral we contacted them today they should be get in touch with the patient. Depending on how things show from that standpoint the patient may need to see Dr. Ardyth Gal who is the infectious disease specialist although he really does not want a PICC line again. No fevers, chills, nausea, vomiting, or diarrhea. He is tolerating hyperbaric oxygen therapy very well. 05/12/2019 on evaluation today  patient presents for follow-up visit concerning his right gluteal fold ulcer. He did see Dr. Celine Ahr the surgeon who previously evaluated his wound and she actually felt like he was doing quite well with regard to the wound based on what she was seen. He does seem to be responding to some degree with the oral antibiotics along with hyperbaric oxygen therapy at this point she did not see any evidence or need for further surgical intervention at this point. She recommended deferring additional or ongoing antibiotic therapy to Dr. Steva Ready at her discretion. Fortunately there is no signs of active systemic infection. No fevers,  chills, nausea, vomiting, or diarrhea. 10/29; patient I am seeing really for the first time today in terms of his wound. 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 He is being treated with hyperbaric oxygen for underlying osteomyelitis most recently documented by a three-phase bone scan. He has been on Levaquin for roughly a month and he is out of these and asked me for a refill. Most recent cultures were negative although I do not think these were bone cultures. He has a very irregular surface to this wound which is almost nodular in texture. It undermines superiorly with the same nodular hyper granulated surface. I see that he was referred to general surgery for an operative debridement although the surgeon did not agree with this I think that would have been the proper course of action in this. He has been using Hydrofera Blue. He is supposed to start a wound VAC tomorrow which is actually a reapplication of wound VAC. I think mostly last time they had trouble keeping the seal in place I hope we have better look at this this time. [name], [name] ([id number]) 05/24/2019 on evaluation today I did review patient's note from Dr. Dellia Nims where he was seen last week when I was out of the office. Subsequently it does appear that Dr. Dellia Nims was in  agreement that the patient really needed debridement to clear away some of the nodular tissue in the base of the wound. The good news is he has the wound VAC initiated and some of this tissue is already starting to breakdown under the treatment of the wound VAC again because it is not really viable. Nonetheless I do believe that we can likely perform some debridement today to clear this away and hopefully the continuation of the wound VAC along with hyperbarics and antibiotic therapy will be beneficial in stopping this from reoccurring. If we get better control in the wound bed in a good spot I think we can definitely use the PriMatrix which we actually did get approval for. 05/31/2019 on evaluation today patient appears to be doing actually pretty well at this point with regard to his wound. He has been tolerating the dressing changes which is good news. Fortunately there is no signs of infection. He still has some of the hyper granular nodules noted we have not really cleared all these away and I think we can try to do a little bit more that today. I did do quite a bit last week I am hoping to get down to the base of the wound so though actually be able to heal more appropriately. I think the wound VAC is doing a good job right now with controlling the drainage and overall the patient is happy with that. 06/07/2019 upon evaluation today patient appears to be doing quite well compared to last week with regard to the hyper granulation in the base of the wound. He has been tolerating the dressing changes without complication. Fortunately there is no signs of active infection at this time. With that being said we have not had any recent measures as far as blood work is concerned I think we may want to check a few things including a CBC, sed rate, and C-reactive protein. Patient is in agreement with that we can try to get that scheduled for him for this Friday. 06/13/2019 on evaluation today patient  actually appears to be doing much better at this point based on what I am seeing with regard to  his wound. He has much less in the way of hyper granular tissue which is good news and a lot more healing that is taken place since I last saw him as far as the amount of space within the wound itself. With that being said he is nearing the completion of the initial 40 treatments for hyperbaric oxygen therapy I think this is something I would recommend extending as he does seem to be benefiting at this time. Fortunately there is no evidence of active systemic infection at this point nor even local infection for that matter. 06/20/2019 upon evaluation today patient actually appears to be doing well with regard to his wound. In fact this appears to be doing much better today compared to what it was previous. I been extremely pleased with the progress that he is made. In fact the tunnel is much less deep today than what it was in the past and I am seeing excellent signs of improvement. Overall I am happy with how things are going. I did review his white blood cell count which revealed everything to be okay. The one abnormal finding on his CBC was a hemoglobin of 12.7. Subsequently I did also review his sed rate and C-reactive protein. His sed rate measured in at normal level measuring at 26. The C-reactive protein however was elevated today and an abnormal range indicating inflammation. Still I feel like he is trending in a good direction at this time. He is going to need a refill of the Levaquin he tells me at this point. 07/04/2019 on evaluation today patient actually appears to be doing well in regard to his wound. He has been tolerating the dressing changes without complication. Fortunately there is no signs of active infection which is good news. No fevers, chills, nausea, vomiting, or diarrhea. The good news also is that I did review his labs and though his sed rate was slightly elevated the C-reactive  protein was actually down compared to the last evaluation 2 weeks ago this is excellent news I think it is showing signs of improvement. 07/11/2019 on evaluation today patient actually appears to be doing quite well with regard to his wound on my evaluation. This is very slow going but nonetheless he seems to be making progress. Especially in regard to the depth. Nonetheless I believe that we may want to consider going forward with the PriMatrix which was previously approved for him. We will get a have to get a new approval as we will actually be applying this in the new year so we will likely obtain that approval on January 4. In the meantime and for the time being my suggestion is going to be that we go ahead and continue with the wound VAC and we were going at this point with the current wound care measures. 12/28-Patient seen after HBO session today, wound depth with the tunnel measures 3.2 cm, the wound bed appears healthy, continuing with the VAC and HBO Electronic Signature(s) Signed: 07/18/2019 10:25:20 AM By: Tobi Bastos MD, MBA Entered By: Tobi Bastos on 07/18/2019 10:25:20 [name], KRAMPITZ ([id number]) [name], [name] ([id number]) -------------------------------------------------------------------------------- Physical Exam Details Patient Name: [name] Date of Service: 07/18/2019 10:00 AM Medical Record Number: [id number] Patient Account Number: [id number] Date of Birth/Sex: 1951/06/10 (68 y.o. M) Treating RN: Harold Barban Primary Care Provider: [name] Other Clinician: Referring Provider: [name] Treating Provider/Extender: Beverly Gust in Treatment: 47 Constitutional alert and oriented x 3. sitting or standing blood pressure is within target  range for patient.. supine blood pressure is within target range for patient.. pulse regular and within target range for patient.Marland Kitchen respirations regular, non-labored and within  target range for patient.Marland Kitchen temperature within target range for patient.. . . Well-nourished and well-hydrated in no acute distress. 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 M) Treating RN: Harold Barban Primary Care Provider: [name] Other Clinician: Referring Provider: [name] Treating Provider/Extender: Beverly Gust in Treatment: 67 Verbal / Phone Orders: No Diagnosis Coding Wound Cleansing Wound #1 Right Gluteal fold o Clean wound with Normal Saline. - in office o Cleanse wound with mild soap and water Skin Barriers/Peri-Wound Care o Skin Prep Primary Wound Dressing Wound #1 Right Gluteal fold o Silver Collagen - under NPWT o Other: - in clinic - plain alginate, gauze and tape Dressing Change Frequency Wound #1 Right Gluteal fold o Change Dressing Monday, Wednesday, Friday Follow-up Appointments Wound #1 Right Gluteal fold o Return Appointment in 1 week. Off-Loading Wound #1 Right Gluteal fold o Turn and reposition every 2 hours Home Health Wound #1 Right Gluteal fold o [place] Nurse may visit PRN to address patientos wound care needs. o FACE TO FACE ENCOUNTER: MEDICARE and MEDICAID PATIENTS: I certify that this patient is under my care and that I had a face-to-face encounter that meets the physician  face-to-face encounter requirements with this patient on this date. The encounter with the patient was in whole or in part for the following MEDICAL CONDITION: (primary reason for [place]) MEDICAL NECESSITY: I certify, that based on my findings, NURSING services are a medically necessary home health service. HOME BOUND STATUS: I certify that my clinical findings support that this patient is homebound (i.e., Due to illness or injury, pt requires aid of supportive devices such as crutches, cane, wheelchairs, walkers, the use of special transportation or the assistance of another person to leave their place of residence. There is a normal inability to leave the home and doing so requires considerable and taxing effort. Other absences are for medical reasons / religious services and are infrequent or of short duration when for other reasons). o If current dressing causes regression in wound condition, may D/C ordered dressing product/s and apply Normal Saline Moist Dressing daily until next [place] / Other MD appointment. [place] of regression in wound condition at [phone]. o Please direct any NON-WOUND related issues/requests for orders to patient's Primary Care Physician [name], [name] ([id number]) Negative Pressure Wound Therapy Wound #1 Right Gluteal fold o Wound VAC settings at 125/130 mmHg continuous pressure. Use BLACK/GREEN foam to wound cavity. Use WHITE foam to fill any tunnel/s and/or undermining. Change VAC dressing 3 X WEEK. Change canister as indicated when full. Nurse may titrate settings and frequency of dressing changes as clinically indicated. o Home Health Nurse may d/c VAC for s/s of increased infection, significant wound regression, or uncontrolled drainage. [place] at [phone]. o Apply contact layer over base of wound. - apply prisma/silver collagen to the base of the wound o  Number of  foam/gauze pieces used in the dressing = Hyperbaric Oxygen Therapy Wound #1 Right Gluteal fold o Evaluate for HBO Therapy o Indication: - Right ischial osteomyelitis o If appropriate for treatment, begin HBOT per protocol: o 2.0 ATA for 90 Minutes without Air Breaks o One treatment per day (delivered Monday through Friday unless otherwise specified in Special Instructions below): o Total # of Treatments: - 60 Electronic Signature(s) Signed: 07/18/2019 11:00:08 AM By: Harold Barban Signed: 07/18/2019 4:26:33 PM By: Tobi Bastos MD, MBA Entered By: Harold Barban on 07/18/2019 11:00:08 ZYKEEM, IFFT ([id number]) -------------------------------------------------------------------------------- Progress Note Details Patient Name: [name] Date of Service: 07/18/2019 10:00 AM Medical Record Number: [id number] Patient Account Number: [id number] Date of Birth/Sex: Jan 13, 1951 (68 y.o. 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 He states that there is no history of diabetes that he is aware of. He does have issues with his prostate and is currently receiving treatment for this by way of oral medication. With that being said I do not have a lot of details in that regard. Nonetheless the patient presents today as a result of having been referred to [place] by another provider initially home health was set to come out and take care of his wound although due to the fact that he apparently drives he's not able to receive  home health. His wife is therefore trying to help take care of this wound within although they have been struggling with what exactly to do at this point. She states that she can do some things but she is definitely not a nurse and does have some issues with looking at blood. The good news is the wound does not appear to be too deep and is fairly superficial at this point. There is no slough noted there is some nonviable skin noted around the surface of the wound and the perimeter at this point. The central portion of the wound appears to be very good with a dermal layer noted this does not appear to be again deep enough to extend it to subcutaneous tissue at this point. 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 In the pump he has Dilaudid and baclofen. 12/07/17 on evaluation today patient presents for follow-up concerning his ongoing lower back thigh ulcer on the right. He states that he did not get the supplies ordered and therefore has not really been able to perform the dressing changes as directed exactly. His wife was able to get some Boarder Foam Dressing's from the drugstore and subsequently has been using hydrogel which did help to a degree in the wound does appear to be able smaller. There is actually more drainage this week noted than previous. 12/21/17 on evaluation today patient appears to be doing rather well in regard to his right gluteal ulcer. He has been tolerating the dressing changes without complication. There does not appear to be any evidence of infection at this point in time. Overall the wound does seem to be making some progress as  far as the edges are concerned there's not as much in the way of overlapping of the external wound edges and he has a good epithelium to wound bed border for the most part. This however is not true right at the 12 o'clock location over the span  of a little over a centimeters which actually will require debridement today to clean this away and hopefully allow it to continue to heal more appropriately. 12/28/17 on evaluation today patient appears to be doing rather well in regard to his ulcer in the left gluteal region. He's been tolerating the dressing changes without complication. Apparently he has had some difficulty getting his dressing material. Apparently there's been some confusion with ordering we're gonna check into this. Nonetheless overall he's been showing signs of improvement which is good news. Debridement is not required today. 01/04/18 on evaluation today patient presents for follow-up concerning his right gluteal ulcer. He has been tolerating the dressing changes fairly well. On inspection today it appears he may actually have some maceration them concerned about the fact that he may be developing too much moisture in and around the wound bed which can cause delay in healing. With that being said he unfortunately really has not showed significant signs of improvement since last week's evaluation in fact this may even be just the little bit/slightly larger. Nonetheless he's been having a lot of discomfort I'm not sure this is even related to the wound as he has no pain when I'm to breeding or otherwise cleaning the wound during evaluation today. Nonetheless this is something that we did recommend he talked to his pain specialist concerning. 01/11/18 on evaluation today patient appears to be doing better in regard to his ulceration. He has been tolerating the dressing changes without complication. With that being said overall there's no evidence of infection which is good news. The only thing is he did receive the hatch affair blue classic versus the ready nonetheless I feel like this is perfectly fine and appears to have done well for him over the past week. 01/25/18 on evaluation today patient's wound actually appears to be a  little bit larger than during the last evaluation. The good DENNYS, HAWKINS. ([id number]) news is the majority of the wound edges actually appear to be fairly firmly attached to the wound bed unfortunately again we're not really making progress in regard to the size. Roughly the wound is about the same size as when I first saw him although again the wound margin/edges appear to be much better. 02/01/18 on evaluation today patient actually appears to be doing very well in regard to his wound. Applying the Prisma dry does seem to be better although he does still have issues with slow progression of the wound. There was a slight improvement compared to last week's measurements today. Nonetheless I have been considering other options as far as the possibility of Theraskin or even a snap vac. In general I'm not sure that the Theraskin due to location of the wound would be a very good idea. Nonetheless I do think that a snap vac could be a possibility for the patient and in fact I think this could even be an excellent way to manage the wound possibly seeing some improvement in a very rapid fashion here. Nonetheless this is something that we would need to get approved and I did have a lengthy conversation with the patient about this today. 02/08/18 on evaluation today patient appears to be doing a little better in  regard to his ulcer. He has been tolerating the dressing changes without complication. Fortunately despite the fact that the wound is a little bit smaller it's not significantly so unfortunately. We have discussed the possibility of a snap vac we did check with insurance this is actually covered at this point. Fortunately there does not appear to be any sign of infection. Overall I'm fairly pleased with how things seem to be appearing at this point. 02/15/18 on evaluation today patient appears to be doing rather well in regard to his right gluteal ulcer. Unfortunately the snap vac did not stay  in place with his sheer and friction this came loose and did not seem to maintain seal very well. He worked for about two days and it did seem to do very well during that time according to his wife but in general this does not seem to be something that's gonna be beneficial for him long-term. I do believe we need to go back to standard dressings to see if we can find something that will be of benefit. 03/02/18- He is here in follow up evaluation; there is minimal change in the wound. He will continue with the same treatment plan, would consider changing to iodosrob/iodoflex if ulcer continues to to plateau. He will follow up next week 03/08/18 on evaluation today patient's wound actually appears to be about the same size as when I previously saw him several weeks back. Unfortunately he does have some slightly dark discoloration in the central portion of the wound which has me concerned about pressure injury. I do believe he may be sitting for too long a period of time in fact he tells me that "I probably sit for much too long". He does have some Slough noted on the surface of the wound and again as far as the size of the wound is concerned I'm really not seeing anything that seems to have improved significantly. 03/15/18 on evaluation today patient appears to be doing fairly well in regard to his ulcer. The wound measured pretty much about the same today compared to last week's evaluation when looking at his graph. With that being said the area of bruising/deep tissue injury that was noted last week I do not see at this point. He did get a new cushion fortunately this does seem to be have been of benefit in my pinion. It does appear that he's been off of this more which is good news as well I think that is definitely showing in the overall wound measurements. With that being said I do believe that he needs to continue to offload I don't think that the fact this is doing better should be or is going to  allow him to not have to offload and explain this to him as well. Overall he seems to be in agreement the plan I think he understands. The overall appearance of the wound bed is improved compared to last week I think the Iodoflex has been beneficial in that regard. 03/29/18 on evaluation today patient actually appears to be doing rather well in regard to his wound from the overall appearance standpoint he does have some granulation although there's some Slough on the surface of the wound noted as well. With that being said he unfortunately has not improved in regard to the overall measurement of the wound in volume or in size. I did have a discussion with him very specifically about offloading today. He actually does work although he mainly is just sitting throughout the  day. He tells me he offloads by "lifting himself up for 30 seconds off of his chair occasionally" purchase from [employer] which does seem to have helped. And he has a new cushion that he with that being said he's also able to stand some for a very short period of time but not significant enough I think to provide appropriate offloading. I think the biggest issue at this point with the wound and the fact is not healing as quickly as we would like is due to the fact that he is really not able to appropriately offload while at work. He states the beginning after his injury he actually had a bed at his job that he could lay on in order to offload and that does seem to have been of help back at that time. Nonetheless he had not done this in quite some time unfortunately. I think that could be helpful for him this is something I would like for him to look into. 04/05/18 on evaluation today patient actually presents for follow-up concerning his right gluteal ulcer. Again he really is not significantly improved even compared to last week. He has been tolerating the dressing changes without complication. With that being said fortunately  there appears to be no evidence of infection at this time. He has been more proactive in trying to offload. DENZIL, PALKO ([id number]) 04/12/18 on evaluation today patient actually appears to be doing a little better in regard to his wound and the right gluteal fold region. He's been tolerating the dressing changes since removing the oasis without complication. However he was having a lot of burning initially with the oasis in place. He's unsure of exactly why this was given so much discomfort but he assumes that it was the oasis itself causing the problem. Nonetheless this had to be removed after about three days in place although even those three days seem to have made a fairly good improvement in regard to the overall appearance of the wound bed. In fact is the first time that he's made any improvement from the standpoint of measurements in about six weeks. He continues to have no discomfort over the area of the wound itself which leads me to wonder why he was having the burning with the oasis when he does not even feel the actual debridement's themselves. I am somewhat perplexed by this. 04/19/18 on evaluation today patient's wound actually appears to be showing signs of epithelialization around the edge of the wound and in general actually appears to be doing better which is good news. He did have the same burning after about three days with applying the Endoform last week in the same fashion that I would generally apply a skin substitute. This seems to indicate that it's not the oasis to cause the problem but potentially the moisture buildup that just causes things to burn or there may be some other reaction with the skin prep or Steri-Strips. Nonetheless I'm not sure that is gonna be able to tolerate any skin substitute for a long period of time. The good news is the wound actually appears to be doing better today compared to last week and does seem to finally be making some  progress. 04/26/18 on evaluation today patient actually appears to be doing rather well in regard to his ulcer in the right gluteal fold. He has been tolerating the dressing changes without complication which is good news. The Endoform does seem to be helping although he was a little bit more  macerated this week. This seems to be an ongoing issue with fluid control at this point. Nonetheless I think we may be able to add something like Drawtex to help control the drainage. 05/03/18 on evaluation today patient appears to actually be doing better in regard to the overall appearance of his wound. He has been tolerating the dressing changes without complication. Fortunately there appears to be no evidence of infection at this time. I really feel like his wound has shown signs as of today of turning around last week I thought so as well and definitely he could be seen in this week's overall appearance and measurements. In general I'm very pleased with the fact that he finally seems to be making a steady but sure progress. The patient likewise is very pleased. 05/17/18 on evaluation today patient appears to be doing more poorly unfortunately in regard to his ulcer. He has been tolerating the dressing changes without complication. With that being said he tells me that in the past couple of days he and his wife have noticed that we did not seem to be doing quite as well is getting dark near the center. Subsequently upon evaluation today the wound actually does appear to be doing worse compared to previous. He has been tolerating the dressing changes otherwise and he states that he is not been sitting up anymore than he was in the past from what he tells me. Still he has continued to work he states "I'm tired of dealing with this and if I have to just go home and lay in the bed all the time that's what I'll do". Nonetheless I am concerned about the fact that this wound does appear to be deeper than what  it was previous. 05/24/18 upon evaluation today patient actually presents after having been in the hospital due to what was presumed to be sepsis secondary to the wound infection. He had an elevated white blood cell count between 14 and 15. With that being said he does seem to be doing somewhat better now. His wound still is giving him some trouble nonetheless and he is obviously concerned about the fact likely talked about that this does seem to go more deeply than previously noted. I did review his wound culture which showed evidence of Staphylococcus aureus him and group B strep. Nonetheless he is on antibiotics, Levaquin, for this. Subsequently I did review his intake summary from the hospital as well. I also did look at the CT of the lumbar spine with contrast that was performed which showed no bone destruction to suggest lumbar disguises/osteomyelitis or sacral osteomyelitis. There was no paraspinal abscess. Nonetheless it appears this may have been more of just a soft tissue infection at this point which is good news. He still is nonetheless concerned about the wound which again I think is completely reasonable considering everything he's been through recently. 05/31/18 on evaluation today on evaluation today patient actually appears to be showing signs of his wound be a little bit deeper than what I would like to see. Fortunately he does not show any signs of significant infection although his temperature was 99 today he states he's been checking this at home and has not been elevated. Nonetheless with the undermining that I'm seeing at this point I am becoming more concerned about the wound I do think that offloading is a key factor here that is preventing the speedy recovery at this point. There does not appear to be any evidence of again over infection  noted. He's been using Santyl currently. 06/07/18 the patient presents today for follow-up evaluation regarding the left ulcer in the  gluteal region. He has been tolerating the Wound VAC fairly well. He is obviously very frustrated with this he states that to mean is really getting in his way. There does not appear to be any evidence of infection at this time he does have a little bit of odor I do not necessarily associate this with infection just something that we sometimes notice with Wound VAC therapy. With that being said I can definitely catch a tone of discontentment overall in the patient's demeanor today. This when he was previously in the hospital [name], [name]. ([id number]) an CT scan was done of the lumbar region which did not reveal any signs of osteomyelitis. With that being said the pelvis in particular was not evaluated distinctly which means he could still have some osteonecrosis I. Nonetheless the Wound VAC was started on Thursday I do want to get this little bit more time before jumping to a CT scan of the pelvis although that is something that I might would recommend if were not see an improvement by that time. 06/14/18 on evaluation today patient actually appears to be doing about the same in regard to his right gluteal ulcer. Again he did have a CT scan of the lumbar spine unfortunately this did not include the pelvis. Nonetheless with the depth of the wound that I'm seeing today even despite the fact that I'm not seeing any evidence of overt cellulitis I believe there's a good chance that we may be dealing with osteomyelitis somewhere in the right Ischial region. No fevers, chills, nausea, or vomiting noted at this time. 06/21/18 on evaluation today patient actually appears to be doing about the same with regard to his wound. The tunnel at 6 o'clock really does not appear to be any deeper although it is a little bit wider. I think at this point you may want to start packing this with white phone. Unfortunately I have not got approval for the CT scan of the pelvis as of yet due to the fact that Medicare  apparently has been denied it due to the diagnosis codes not being appropriate according to Medicare for the test requested. With that being said the patient cannot have an MRI and therefore this is the only option that we have as far as testing is concerned. The patient has had infection and was on antibiotics and been added code for cellulitis of the bottom to see if this will be appropriate for getting the test approved. Nonetheless I'm concerned about the infection have been spread deeper into the Ischial region. 06/28/18 on evaluation today patient actually appears to be doing rather well all things considered in regard to the right gluteal ulcer. He has been tolerating the dressing changes without complication. With that being said the Wound VAC he states does have to be replaced almost every day or at least reinforced unfortunately. Patient actually has his CT scan later this morning we should have the results by tomorrow. 07/05/18 on evaluation today patient presents for follow-up concerning his right Ischial ulcer. He did see the surgeon Dr. Lysle Pearl last week. They were actually very happy with him and felt like he spent a tremendous amount of time with them as far as discussing his situation was concerned. In the end Dr. Lysle Pearl did contact me as well and determine that he would not recommend any surgical intervention at this point  as he felt like it would not be in the patient's best interest based on what he was seeing. He recommended a referral to infectious disease. Subsequently this is something that Dr. Ines Bloomer office is working on setting up for the patient. As far as evaluation today is concerned the patient's wound actually appears to be worse at this point. I am concerned about how things are progressing and specifically about infection. I do not feel like it's the deeper but the area of depth is definitely widening which does have me concerned. No fevers, chills, nausea, or  vomiting noted at this time. I think that we do need initiate antibiotic therapy the patient has an allow allergy to amoxicillin/penicillin he states that he gets a rash since childhood. Nonetheless she's never had the issues with Catholics or cephalosporins in general but he is aware of. 07/27/18 on evaluation today patient presents following admission to the hospital on 07/09/18. He was subsequently discharged on 07/20/18. On 07/15/18 the patient underwent irrigation and debridement was soft tissue biopsy and bone biopsy as well as placement of a Wound VAC in the OR by Dr. Celine Ahr. During the hospital course the patient was placed on a Wound VAC and recommended follow up with surgery in three weeks actually with Dr. Delaine Lame who is infectious disease. The patient was on vancomycin during the hospital course. 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 He also had a bone culture which revealed evidence of methicillin-resistant staph aureus. He is updated CT scan 07/09/18 reveals that he had progression of the which was performed on wound to breakdown down to the trochanter where he actually had irregularities there as well suggestive of osteomyelitis. This was a change just since 9 December when we last performed a CT scan. Obviously this one had gone downhill quite significantly and rapidly. At this point upon evaluation I feel like in general the patient's wound seems to be doing fairly well all things considered upon my evaluation today. Obviously this is larger and deeper than what I previously evaluated but at the same time he seems to be making some progress as far as the appearance of the granulation tissue is concerned. I'm happy in that regard. No fevers, chills, nausea, or vomiting noted at this time. He is on IV vancomycin and Rocephin at the facility. He is currently in [facility]. 08/03/18 upon evaluation today patient's wound appears to be doing  better in regard to the overall appearance at this point in time. Fortunately he's been tolerating the Wound VAC without complication and states that the facility has been taking excellent care of the wound site. Overall I see some Slough noted on the surface which I am going to attempt sharp debridement today of but nonetheless other than this I feel like he's making progress. 08/09/18 on evaluation today patient's wound appears to be doing much better compared to even last week's evaluation. Do believe that the Wound VAC is been of great benefit for him. He has been tolerating the dressing changes that is the Wound WEIKKO, LANTZY. ([id number]) VAC without any complication and he has excellent granulation noted currently. There is no need for sharp debridement at this point. 08/16/18 on evaluation today patient actually appears to be doing very well in regard to the wound in the right gluteal fold region. This is showing signs of progress and again appears to be very healthy which is excellent news. Fortunately there is no sign of active infection  by way of odor or drainage at this point. Overall I'm very pleased with how things stand. He seems to be tolerating the Wound VAC without complication. 08/23/18 on evaluation today patient actually appears to be doing better in regard to his wound. He has been tolerating the Wound VAC without complication and in fact it has been collecting a significant amount of drainage which I think is good news especially considering how the wound appears. Fortunately there is no signs of infection at this time definitely nothing appears to be worse which is good news. He has not been started on the Bactrim and Flagyl that was recommended by Dr. Delaine Lame yet. I did actually contact her office this morning in order to check and see were things are that regard their gonna be calling me back. 08/30/18 on evaluation today patient actually appears to show signs of  excellent improvement today compared to last evaluation. The undermining is getting much better the wound seems to be feeling quite nicely and I'm very pleased that the granulation in general. With that being said overall I feel like the patient has made excellent progress which is great news. No fevers, chills, nausea, or vomiting noted at this time. 09/06/18 on evaluation today patient actually appears to be doing rather well in regard to his right gluteal ulcer. This is showing signs of improvement in overall I'm very pleased with how things seem to be progressing. The patient likewise is please. Overall I see no evidence of infection he is about to complete his oral antibiotic regimen which is the end of the antibiotics for him in just about three days. 09/13/18 on evaluation today patient's right Ischial ulcer appears to be showing signs of continued improvement which is excellent news. He's been tolerating the dressing changes without complication. Fortunately there's no signs of infection and the wound that seems to be doing very well. 09/28/18 on evaluation today patient appears to be doing rather well in regard to his right Ischial ulcer. He's been tolerating the Wound VAC without complication he knows there's much less drainage than there used to be this obviously is not a bad thing in my pinion. There's no evidence of infection despite the fact is but nothing about it now for several weeks. 10/04/18 on evaluation today patient appears to be doing better in regard to his right Ischial wound. He has been tolerating the Wound VAC without complication and I do believe that the silver nitrate last week was beneficial for him. Fortunately overall there's no evidence of active infection at this time which is great news. No fevers, chills, nausea, or vomiting noted at this time. 10/11/18 on evaluation today patient actually appears to be doing rather well in regard to his Ischial ulcer. He's been  tolerating the Wound VAC still without complication I feel like this is doing a good job. No fevers, chills, nausea, or vomiting noted at this time. 11/01/18 on evaluation today patient presents after having not been seen in our clinic for several weeks secondary to the fact that he was on evaluation today patient presents after having not been seen in our clinic for several weeks secondary to the fact that he was in a skilled nursing facility which was on lockdown currently due to the covert 19 national emergency. Subsequently he was discharged from the facility on this past Friday and subsequently made an appointment to come in to see yesterday. Fortunately there's no signs of active infection at this time which is good news  and overall he does seem to have made progress since I last saw. Overall I feel like things are progressing quite nicely. The patient is having no pain. 11/08/18 on evaluation today patient appears to be doing okay in regard to his right gluteal ulcer. He has been utilizing a Wound VAC home health this changing this at this point since he's home from the skilled nursing facility. Fortunately there's no signs of obvious active infection at this time. Unfortunately though there's no obvious active infection he is having some maceration and his wife states that when the sheets of the Wound VAC office on Sunday when it broke seal that he ended up having significant issues with some smell as well there concerned about the possibility of infection. Fortunately there's No fevers, chills, nausea, or vomiting noted at this time. 11/15/18 on evaluation today patient actually appears to be doing well in regard to his right gluteal ulcer. He has been tolerating the dressing changes without complication. Specifically the Wound VAC has been utilized up to this point. Fortunately there's no signs of infection and overall I feel like he has made progress even since last week when I last  saw him. I'm actually fairly happy with the overall appearance although he does seem to have somewhat of a hyper granular Goyne, Yuki J. ([id number]) overgrowth in the central portion of the wound which I think may require some sharp debridement to try flatness out possibly utilizing chemical cauterization following. 11/23/18 on evaluation today patient actually appears to be doing very well in regard to his sacral ulcer. He seems to be showing signs of improvement with good granulation. With that being said he still has the small area of hyper granulation right in the central portion of the wound which I'm gonna likely utilize silver nitrate on today. Subsequently he also keeps having a leak at the 6 o'clock location which is unfortunate we may be able to help out with some suggestions to try to prevent this going forward. Fortunately there's no signs of active infection at this time. 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 He's been tolerating the dressing changes without complication. Fortunately there's no signs of active infection at this time. I've been rather pleased with how things have progressed there still some evidence of pressure getting to the area with some redness right around the immediate wound opening. Nonetheless other than this I'm not seeing any significant complications or issues the wound is somewhat hyper granular. Upon discussing with the patient and his wife today I'm not sure that the wound is being packed to the base with the foam at this point. And if it's not been packed fully that may be part of the reason why is not seen as much improvement as far as the granulation from the base out. Again we do not want pack too tightly but we need some of the firm to get to the base of the wound. I discussed this with patient and his wife today. 12/06/18 on evaluation today  patient appears to be doing well in regard to his right gluteal pressure ulcer. He's been tolerating the dressing changes without complication. Fortunately there's no signs of active infection. He still has some hyper granular tissue and I do think it would be appropriate to continue with the chemical cauterization as of today. 12/16/18 on evaluation today patient actually appears to be doing okay in  regard to his right gluteal ulcer. He is been tolerating the dressing changes without complication including the Wound VAC. Overall I feel like nothing seems to be worsening I do feel like that the hyper granulation buds in the central portion of the wound have improved to some degree with the silver nitrate. We will have to see how things continue to progress. 12/20/18 on evaluation today patient actually appears to be doing much worse in my pinion even compared to last week's evaluation. Unfortunately as opposed to showing any signs of improvement the areas of hyper granular tissue in the central portion of the wound seem to be getting worse. Subsequently the wound bed itself also seems to be getting deeper even compared to last week which is both unusual as well as concerning since prior he had been shown signs of improvement. Nonetheless I think that the issue could be that he's actually having some difficulty in issues with a deeper infection. There's no external signs of infection but nonetheless I am more worried about the internal, osteomyelitis, that could be restarting. He has not been on antibiotics for some time at this point. I think that it may be a good idea to go ahead and started back on an antibiotic therapy while we wait to see what the testing shows. 12/27/18 on evaluation today patient presents for follow-up concerning his left gluteal fold wound. Fortunately he appears to be doing well today. I did review the CT scan which was negative for any signs of osteomyelitis or acute abnormality  this is excellent news. Overall I feel like the surface of the wound bed appears to be doing significantly better today compared to previously noted findings. There does not appear any signs of infection nor does he have any pain at this time. 01/03/19 on evaluation today patient actually appears to be doing quite well in regard to his ulcer. Post debridement last week he really did not have too much bleeding which is good news. Fortunately today this seems to be doing some better but we still has some of the hyper granular tissue noted in the base of the wound which is gonna require sharp debridement today as well. Overall I'm pleased with how things seem to be progressing since we switched away from the Wound VAC I think he is making some progress. 01/10/19 on evaluation today patient appears to be doing better in regard to his right gluteal fold ulcer. He has been tolerating the dressing changes without complication. The debridement to seem to be helping with current away some of the poor hyper granular tissue bugs throughout the region of his gluteal fold wound. He's been tolerating the dressing changes otherwise without complication which is great news. No fevers, chills, nausea, or vomiting noted at this time. 01/17/19 on evaluation today patient actually appears to be doing excellent in regard to his wound. He's been tolerating the dressing changes without complication. Fortunately there is no signs of active infection at this time which is great news. No fevers, chills, nausea, or vomiting noted at this time. 01/24/19 on evaluation today patient actually appears to be doing quite well with regard to his ulcer. He has been tolerating the dressing changes without complication. Fortunately there's no signs of active infection at this time. Overall been very pleased with the progress that he seems to be making currently. 01/31/19-Patient returns at 1 week with apparent similarity in dimensions to the  wound, with no signs of infection, he has been Hinchliffe, Stow (  [id number]) changing dressings twice a day 02/08/19 upon evaluation today patient actually appears to be doing well with regard to his right Ischial ulcer. The wound is not appear to be quite as deep and seems to be making progress which is good news. With that being said I'm still reluctant to go back to the Wound VAC at this point. He's been having to change the dressings twice a day which is a little bit much in my pinion from the wound care supplies standpoint. I think that possibly attempting to utilize extras orbit may be beneficial this may also help to prevent any additional breakdown secondary to fluid retention in the wound itself. The patient is in agreement with giving this a try. 02/15/19 on evaluation today patient actually appears to be doing decently well with regard to his ulcer in the right to gluteal fold location. He's been tolerating the dressing changes without complication. Fortunately there is no signs of active infection at this time. He is able to keep the current dressing in place more effectively for a day at a time whereas before he was having a changes to to three times a day. The actions or has been helpful in this regard. Fortunately there's no signs of anything getting worse and I do feel like he showing signs of good improvement with regard to the wound bed status. 02/22/2019 patient appears to be doing very well today with regard to his ulcer in the gluteal fold. Fortunately there is no signs of active infection and he has been tolerating the dressing changes without any complication. Overall extremely pleased with how things seem to be progressing. He has much less of the hyper granular projections within the wound these have slowly been debrided away and he seems to be doing well. The wound bed is more uniform. 03/01/19 on evaluation today patient appears to be doing unfortunately about the same in  regard to his gluteal ulcer. He's been tolerating the dressing changes without complication. Fortunately there's no signs of active infection at this time. With that being said he continues to develop these hyper granular projections which I'm unsure of exactly what they are and why they are rising. Nonetheless I explained to the patient that I do believe it would be a good idea for [place] to stand a biopsy sample for pathology to see if that can shed any light on what exactly may be going on here. Fortunately I do not see any obvious signs of infection. With that being said the patient has had a little bit more drainage this week apparently compared to last week. 03/08/2019 on evaluation today patient actually appears to look somewhat better with regard to the appearance of his wound bed at this time. This is good news. Overall I am very pleased with how things seem to have progressed just in the past week with a switch to the [facility] dressing. I think that has been beneficial for him. With that being said at this time the patient is concerned about his biopsy that I sent off last week unfortunately I do not have that report as of yet. Nonetheless we have called to obtain this and hopefully will hear back from the lab later this morning. 03/15/19 on evaluation today patient's wound actually appears to be doing okay today with regard to the overall appearance of the wound bed. He has been tolerating the dressing changes without complication he still has hyper granular tissue noted but fortunately that seems to be minimal  at this point compared to some of what we've seen in the past. Nonetheless I do think that he is still having some issues currently with some of this type of granulation the biopsy and since all showed nothing more than just evidence of granulation tissue. Therefore there really is nothing different to initiate or do at this point. 03/24/19 on evaluation today patient appears to be  doing a little better with regard to his ulcer. He's been tolerating the dressing changes without complication. Fortunately there is no signs of active infection at this time. No fevers, chills, nausea, or vomiting noted at this time. I'm overall pleased with how things seem to be progressing. 03/29/2019 on evaluation today patient appears to be doing about the same in regard to his ulcer in the right gluteal fold. Unfortunately he is not seeming to make a lot of progress and the wound is somewhat stalled. There is no signs of active infection externally but I am concerned about the possibility of infection continuing in the ischial location which previously he did have infection noted. Again is not able to have an MRI so probably our best option for testing for this would be a triple phase bone scan which will detect subtle changes in the bone more so than plain film x-rays. In the past they really have not been beneficial and in fact the CT scans even have been somewhat questionable at times. Nonetheless there is no signs of systemic infection which is at least good news but again his wound is not healing at all on the predicted schedule. 04/05/19 on evaluation today patient appears to be doing well all things considering with regard to his wound and the right gluteal fold. He actually has his triple bone scan scheduled for sometime in the next couple of days. With that being said I've also been looking to other possibilities of what could be causing hyper granular tissue were looking into the bone scan again for evaluating for the risk or possibility of infection deeper and I'm also gonna go ahead and see about obtaining a deep tissue culture today to send and see if there's any evidence of infection noted on culture. He's in agreement with that plan. 04/12/2019 on evaluation today patient presents for reevaluation here in our clinic concerning ongoing issues with his right gluteal fold ulcer. 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 It was discussed with him at that point that I think it would be appropriate [name], [name]. ([id number]) for him to proceed with hyperbaric oxygen therapy especially in light of the fact that he is previously been on IV antibiotics at the beginning of the year for close to 3 months followed by several weeks of oral antibiotics that was all prescribed by infectious disease. He had surgical debridement around Christmas December 2019 due to an abscess and osteomyelitis of the ischial bone. Unfortunately this has not really proceeded as well as we would have liked and again we did a CT scan even a couple months ago as he cannot have an MRI secondary to having issues with both a pain pump as well as a spinal cord stimulator which prevent him from going into an MRI machine. With that being said there were chronic changes noted at the ischial tuberosity which had progressed since December 2019 there was no evidence of fluid collection on that initial CT scan.  With that being said that was on January 21, 2019. I am not sure that it was a sensitive enough test as compared to an MRI and then subsequently I ordered a bone scan for him which was actually completed on 03/29/2019 and this revealed that he does indeed have positive osteomyelitis involving the right ischial tuberosity. This is adjacent to the ulcer and I think is the reason that his ulcer is not healing. Subsequently I am in a place him back on oral antibiotics today unfortunately his wound culture showed just mixed gram-negative flora with no specific findings of a predominant organism. Nonetheless being with the location I think a good broad-spectrum antibiotic for gram-negative's is a good choice at this point he is previously taken Levaquin without significant issues and I think that is an  appropriate antibiotic for him at this time. 04/19/2019 on evaluation today patient actually appears to be doing fairly well with regard to his wound. He has been tolerating the dressing changes without complication. Fortunately there is no signs of active infection and he has been taking the oral antibiotics at this time. Subsequently we did make a referral to infectious disease although Dr. Steva Ready wants the patient to be seen by general surgery first in order apparently to see if there is anything from a surgical standpoint that should be done prior to initiation of IV antibiotic therapy. Again the patient is okay with actually seeing Dr. Celine Ahr whom he has seen before we will make that referral. 04/26/2019 on evaluation today patient actually appears to be doing well with regard to his wound. He has been tolerating the dressing changes without complication. Fortunately there is no signs of active infection at this time. I do believe that the hyperbaric oxygen therapy along with the antibiotics which I prescribed at this point have been doing well for him. With that being said he has not seen Dr. Celine Ahr the surgeon yet in fact they have not been contacted for scheduling an appointment as of yet. Subsequently the patient is not on antibiotics currently by IV Dr. Steva Ready did want him to see Dr. Celine Ahr first which we are working on trying to get scheduled. We again give the information to the patient today for Dr. Celine Ahr and her number as far as contacting their office to see about getting something scheduled. Again were looking for whether or not they recommend any surgical intervention. 05/03/2019 on evaluation today patient appears to be doing about the same with regard to his wound there may be a little bit of filling in in regard to the base of the wound but still he has quite a bit of hyper granular tissue that I am seeing today. I believe that he may may need a more aggressive sharp  debridement possibly even by the surgeon or under anesthesia in order to clear away some of the hyper granular material in order to help allow for appropriate granulation to fill in. We have made a referral to Dr. Celine Ahr unfortunately it sounds as if they did not receive the referral we contacted them today they should be get in touch with the patient. Depending on how things show from that standpoint the patient may need to see Dr. Ardyth Gal who is the infectious disease specialist although he really does not want a PICC line again. No fevers, chills, nausea, vomiting, or diarrhea. He is tolerating hyperbaric oxygen therapy very well. 05/12/2019 on evaluation today patient presents for follow-up visit concerning his right gluteal fold ulcer. He  did see Dr. Celine Ahr the surgeon who previously evaluated his wound and she actually felt like he was doing quite well with regard to the wound based on what she was seen. He does seem to be responding to some degree with the oral antibiotics along with hyperbaric oxygen therapy at this point she did not see any evidence or need for further surgical intervention at this point. She recommended deferring additional or ongoing antibiotic therapy to Dr. Steva Ready at her discretion. Fortunately there is no signs of active systemic infection. No fevers, chills, nausea, vomiting, or diarrhea. 10/29; patient I am seeing really for the first time today in terms of his wound. 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 He is being treated with hyperbaric oxygen for underlying osteomyelitis most recently documented by a three-phase bone scan. He has been on Levaquin for roughly a month and he is out of these and asked me for a refill. Most recent cultures were negative although I do not think these were bone cultures. He has a very irregular surface to this wound which is almost nodular in texture. It undermines superiorly with the same nodular  hyper granulated surface. I see that he was referred to general surgery for an operative debridement although the surgeon did not agree with this I think that would have been the proper course of action in this. He has been using Hydrofera Blue. He is supposed to start a wound VAC tomorrow which is actually a reapplication of wound VAC. I think mostly last time they had trouble keeping the seal in place I hope we have better look at this this time. 05/24/2019 on evaluation today I did review patient's note from Dr. Dellia Nims where he was seen last week when I was out of the office. Subsequently it does appear that Dr. Dellia Nims was in agreement that the patient really needed debridement to clear away some of the nodular tissue in the base of the wound. The good news is he has the wound VAC initiated and some of this tissue is already starting to breakdown under the treatment of the wound VAC again because it is not really viable. DALLES, SAUCER ([id number]) Nonetheless I do believe that we can likely perform some debridement today to clear this away and hopefully the continuation of the wound VAC along with hyperbarics and antibiotic therapy will be beneficial in stopping this from reoccurring. If we get better control in the wound bed in a good spot I think we can definitely use the PriMatrix which we actually did get approval for. 05/31/2019 on evaluation today patient appears to be doing actually pretty well at this point with regard to his wound. He has been tolerating the dressing changes which is good news. Fortunately there is no signs of infection. He still has some of the hyper granular nodules noted we have not really cleared all these away and I think we can try to do a little bit more that today. I did do quite a bit last week I am hoping to get down to the base of the wound so though actually be able to heal more appropriately. I think the wound VAC is doing a good job right now with  controlling the drainage and overall the patient is happy with that. 06/07/2019 upon evaluation today patient appears to be doing quite well compared to last week with regard to the hyper granulation in the base of the wound. He has been tolerating the  dressing changes without complication. Fortunately there is no signs of active infection at this time. With that being said we have not had any recent measures as far as blood work is concerned I think we may want to check a few things including a CBC, sed rate, and C-reactive protein. Patient is in agreement with that we can try to get that scheduled for him for this Friday. 06/13/2019 on evaluation today patient actually appears to be doing much better at this point based on what I am seeing with regard to his wound. He has much less in the way of hyper granular tissue which is good news and a lot more healing that is taken place since I last saw him as far as the amount of space within the wound itself. With that being said he is nearing the completion of the initial 40 treatments for hyperbaric oxygen therapy I think this is something I would recommend extending as he does seem to be benefiting at this time. Fortunately there is no evidence of active systemic infection at this point nor even local infection for that matter. 06/20/2019 upon evaluation today patient actually appears to be doing well with regard to his wound. In fact this appears to be doing much better today compared to what it was previous. I been extremely pleased with the progress that he is made. In fact the tunnel is much less deep today than what it was in the past and I am seeing excellent signs of improvement. Overall I am happy with how things are going. I did review his white blood cell count which revealed everything to be okay. The one abnormal finding on his CBC was a hemoglobin of 12.7. Subsequently I did also review his sed rate and C-reactive protein. His sed rate  measured in at normal level measuring at 26. The C-reactive protein however was elevated today and an abnormal range indicating inflammation. Still I feel like he is trending in a good direction at this time. He is going to need a refill of the Levaquin he tells me at this point. 07/04/2019 on evaluation today patient actually appears to be doing well in regard to his wound. He has been tolerating the dressing changes without complication. Fortunately there is no signs of active infection which is good news. No fevers, chills, nausea, vomiting, or diarrhea. The good news also is that I did review his labs and though his sed rate was slightly elevated the C-reactive protein was actually down compared to the last evaluation 2 weeks ago this is excellent news I think it is showing signs of improvement. 07/11/2019 on evaluation today patient actually appears to be doing quite well with regard to his wound on my evaluation. This is very slow going but nonetheless he seems to be making progress. Especially in regard to the depth. Nonetheless I believe that we may want to consider going forward with the PriMatrix which was previously approved for him. We will get a have to get a new approval as we will actually be applying this in the new year so we will likely obtain that approval on January 4. In the meantime and for the time being my suggestion is going to be that we go ahead and continue with the wound VAC and we were going at this point with the current wound care measures. 12/28-Patient seen after HBO session today, wound depth with the tunnel measures 3.2 cm, the wound bed appears healthy, continuing with the VAC and HBO  Objective GATSBY, BYMAN. ([id number]) Constitutional alert and oriented x 3. sitting or standing blood pressure is within target range for patient.. supine blood pressure is within target range for patient.. pulse regular and within target range for patient.Marland Kitchen respirations  regular, non-labored and within target range for patient.Marland Kitchen temperature within target range for patient.. Well-nourished and well-hydrated in no acute distress. Vitals Time Taken: 10:15 AM, Height: 73 in, Weight: 210 lbs, BMI: 27.7, Temperature: 98.0 F, Pulse: 72 bpm, Respiratory Rate: 16 breaths/min, Blood Pressure: 128/78 mmHg. General Notes: The right gluteal fold wound described a stage IV, maximum wound depth of 3.2 cm in the tunneling area, wound bed appears healthy, wound edges appear well demarcated, Integumentary (Hair, Skin) Wound #1 status is Open. Original cause of wound was Pressure Injury. The wound is located on the Right Gluteal fold. The wound measures 2cm length x 1.4cm width x 2.3cm depth; 2.199cm^2 area and 5.058cm^3 volume. Plan 1. Continue with silver alginate with gauze, and the wound VAC 2. Return visit 2 weeks 3. Continue offloading which seems to be going well for this patient. Electronic Signature(s) Signed: 07/18/2019 10:28:13 AM By: Tobi Bastos MD, MBA Entered By: Tobi Bastos on 07/18/2019 10:28:13 LESHAUN, GACKE ([id number]) -------------------------------------------------------------------------------- SuperBill Details Patient Name: [name] Date of Service: 07/18/2019 Medical Record Number: [id number] Patient Account Number: [id number] Date of Birth/Sex: 01-Mar-1951 (68 y.o. 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

## 2019-07-19 NOTE — Progress Notes (Signed)
[name], [name] ([id number]) Visit Report for 07/18/2019 HBO Details Patient Name: [name], [name] Date of Service: 07/18/2019 8:00 AM Medical Record Number: [id number] Patient Account Number: [id number] Date of Birth/Sex: [birth year] (68 y.o. M) Treating RN: Harold Barban Primary Care Diala Waxman: [name] Other Clinician: [name] Referring Camora Tremain: [name] Treating Jackey Housey/Extender: Beverly Gust in Treatment: 74 HBO Treatment Course Details Treatment Course Number: 1 Ordering Emry Tobin: [name], HOYT Total Treatments Ordered: 97 HBO Treatment Start Date: 04/18/2019 HBO Indication: Chronic Refractory Osteomyelitis to Right Ischial Tuberosity HBO Treatment Details Treatment Number: 57 Patient Type: Outpatient Chamber Type: Monoplace Chamber Serial #: [id number] Treatment Protocol: 2.0 ATA with 90 minutes oxygen, and no air breaks Treatment Details Compression Rate Down: 1.5 psi / minute De-Compression Rate Up: 2.5 psi / minute Compress Tx Pressure Air breaks and breathing periods Decompress Decompress Begins Reached (leave unused spaces blank) Begins Ends Chamber Pressure (ATA) 1 2 - - - - - - 2 1 Clock Time (24 hr) 08:22 08:33 - - - - - - 10:03 10:10 Treatment Length: 108 (minutes) Treatment Segments: 4 Vital Signs Capillary Blood Glucose Reference Range: 80 - 120 mg / dl HBO Diabetic Blood Glucose Intervention Range: <131 mg/dl or >249 mg/dl Time Vitals Blood Respiratory Capillary Blood Glucose Pulse Action Type: Pulse: Temperature: Taken: Pressure: Rate: Glucose (mg/dl): Meter #: Oximetry (%) Taken: Pre 08:17 106/62 72 16 98.7 Post 10:15 128/78 72 16 98 Treatment Response Treatment Toleration: Well Treatment Completion Treatment Completed without Adverse Event Status: Electronic Signature(s) Signed: 07/18/2019 12:31:40 PM By: Paulla Fore, RRT, CHT Signed: 07/18/2019 4:26:33 PM By: Tobi Bastos MD,  MBA Entered By: Lorine Bears on 07/18/2019 10:19:07 [name], [name] ([id number]) -------------------------------------------------------------------------------- HBO Safety Checklist Details Patient Name: [name] Date of Service: 07/18/2019 8:00 AM Medical Record Number: [id number] Patient Account Number: [id number] Date of Birth/Sex: 08/10/1950 (68 y.o. M) Treating RN: Harold Barban Primary Care Maridel Pixler: [name] Other Clinician: [name] Referring Bud Kaeser: [name] Treating Kennis Buell/Extender: Beverly Gust in Treatment: 52 HBO Safety Checklist Items Safety Checklist Consent Form Signed Patient voided / foley secured and emptied When did you last eato morning Last dose of injectable or oral agent n/a Ostomy pouch emptied and vented if applicable NA Medtronic Baclofen Pump; Nevro All implantable devices assessed, documented and approved Spinal Stimulator Intravenous access site secured and place NA Valuables secured Linens and cotton and cotton/polyester blend (less than 51% polyester) Personal oil-based products / skin lotions / body lotions removed Wigs or hairpieces removed NA Smoking or tobacco materials removed NA Books / newspapers / magazines / loose paper removed Cologne, aftershave, perfume and deodorant removed Jewelry removed (may wrap wedding band) Make-up removed NA Hair care products removed Battery operated devices (external) removed NPWT pump Heating patches and chemical warmers removed Titanium eyewear removed NA Nail polish cured greater than 10 hours NA Casting material cured greater than 10 hours NA Hearing aids removed NA Loose dentures or partials removed NA Prosthetics have been removed NA Patient demonstrates correct use of air break device (if applicable) Patient concerns have been addressed Patient grounding bracelet on and cord attached to chamber Specifics for  Inpatients (complete in addition to above) Medication sheet sent with patient Intravenous medications needed or due during therapy sent with patient Drainage tubes (e.g. nasogastric tube or chest tube secured and vented) Endotracheal or Tracheotomy tube secured Cuff deflated of air and inflated with saline Airway suctioned Electronic Signature(s) [name], [name] ([id number]) Signed: 07/18/2019 12:31:40  PM By: Lorine Bears RCP, RRT, CHT Entered By: Lorine Bears on 07/18/2019 10:05:48

## 2019-07-19 NOTE — Progress Notes (Signed)
DEAARON, PAYANT ([id number]) Visit Report for 07/19/2019 HBO Details Patient Name: ESHAN, HAYMAN Date of Service: 07/19/2019 8:00 AM Medical Record Number: [id number] Patient Account Number: [id number] Date of Birth/Sex: [birth year] (68 y.o. M) Treating RN: Montey Hora Primary Care Renne Platts: [name] Other Clinician: [name] Referring Joah Patlan: [name] Treating Minerva Bluett/Extender: Beverly Gust in Treatment: 30 HBO Treatment Course Details Treatment Course Number: 1 Ordering Nocholas Damaso: [name], HOYT Total Treatments Ordered: 17 HBO Treatment Start Date: 04/18/2019 HBO Indication: Chronic Refractory Osteomyelitis to Right Ischial Tuberosity HBO Treatment Details Treatment Number: 58 Patient Type: Outpatient Chamber Type: Monoplace Chamber Serial #: [id number] Treatment Protocol: 2.0 ATA with 90 minutes oxygen, and no air breaks Treatment Details Compression Rate Down: 1.5 psi / minute De-Compression Rate Up: 1.5 psi / minute Compress Tx Pressure Air breaks and breathing periods Decompress Decompress Begins Reached (leave unused spaces blank) Begins Ends Chamber Pressure (ATA) 1 2 - - - - - - 2 1 Clock Time (24 hr) 08:23 08:35 - - - - - - 10:05 10:15 Treatment Length: 112 (minutes) Treatment Segments: 4 Vital Signs Capillary Blood Glucose Reference Range: 80 - 120 mg / dl HBO Diabetic Blood Glucose Intervention Range: <131 mg/dl or >249 mg/dl Time Vitals Blood Respiratory Capillary Blood Glucose Pulse Action Type: Pulse: Temperature: Taken: Pressure: Rate: Glucose (mg/dl): Meter #: Oximetry (%) Taken: Pre 08:18 116/64 72 16 98.9 Post 10:19 112/72 66 16 98.1 Treatment Response Treatment Toleration: Well Treatment Completion Treatment Completed without Adverse Event Status: Electronic Signature(s) Signed: 07/19/2019 10:29:09 AM By: Lorine Bears RCP, RRT, CHT Signed: 07/19/2019 4:37:17 PM By: Tobi Bastos  MD, MBA Entered By: Lorine Bears on 07/19/2019 10:27:40 REDFORD, SALSER ([id number]) -------------------------------------------------------------------------------- HBO Safety Checklist Details Patient Name: [name] Date of Service: 07/19/2019 8:00 AM Medical Record Number: [id number] Patient Account Number: [id number] Date of Birth/Sex: 05-12-1951 (68 y.o. M) Treating RN: Montey Hora Primary Care Britini Garcilazo: [name] Other Clinician: [name] Referring Cynthya Yam: [name] Treating Deaundre Allston/Extender: Beverly Gust in Treatment: 10 HBO Safety Checklist Items Safety Checklist Consent Form Signed Patient voided / foley secured and emptied When did you last eato morning Last dose of injectable or oral agent n/a Ostomy pouch emptied and vented if applicable NA Medtronic Baclofen Pump; Nevro All implantable devices assessed, documented and approved Spinal Stimulator Intravenous access site secured and place NA Valuables secured Linens and cotton and cotton/polyester blend (less than 51% polyester) Personal oil-based products / skin lotions / body lotions removed Wigs or hairpieces removed NA Smoking or tobacco materials removed NA Books / newspapers / magazines / loose paper removed Cologne, aftershave, perfume and deodorant removed Jewelry removed (may wrap wedding band) Make-up removed NA Hair care products removed Battery operated devices (external) removed NPWT pump Heating patches and chemical warmers removed Titanium eyewear removed NA Nail polish cured greater than 10 hours NA Casting material cured greater than 10 hours NA Hearing aids removed NA Loose dentures or partials removed NA Prosthetics have been removed NA Patient demonstrates correct use of air break device (if applicable) Patient concerns have been addressed Patient grounding bracelet on and cord attached to chamber Specifics for  Inpatients (complete in addition to above) Medication sheet sent with patient Intravenous medications needed or due during therapy sent with patient Drainage tubes (e.g. nasogastric tube or chest tube secured and vented) Endotracheal or Tracheotomy tube secured Cuff deflated of air and inflated with saline Airway suctioned Electronic Signature(s) RUSTIN, MCGONIGLE ([id number]) Signed: 07/19/2019 10:29:09  AM By: Lorine Bears RCP, RRT, CHT Entered By: Lorine Bears on 07/19/2019 08:28:51

## 2019-07-20 ENCOUNTER — Encounter: Payer: Medicare Other | Admitting: Internal Medicine

## 2019-07-20 ENCOUNTER — Other Ambulatory Visit: Payer: Self-pay

## 2019-07-20 DIAGNOSIS — L89314 Pressure ulcer of right buttock, stage 4: Secondary | ICD-10-CM | POA: Diagnosis not present

## 2019-07-20 NOTE — Progress Notes (Signed)
MIDAS, FEIMSTER ([id number]) Visit Report for 07/20/2019 Arrival Information Details Patient Name: [name], [name] Date of Service: 07/20/2019 8:00 AM Medical Record Number: [id number] Patient Account Number: [id number] Date of Birth/Sex: 19-Jan-1951 (68 y.o. M) Treating RN: Army Melia Primary Care Stacee Earp: [name] Other Clinician: [name] Referring Wah Sabic: [name] Treating Brevyn Ring/Extender: [name] in Treatment: 38 Visit Information History Since Last Visit Added or deleted any medications: No Patient Arrived: Wheel Chair Any new allergies or adverse reactions: No Arrival Time: 08:00 Had a fall or experienced change in No Accompanied By: wife activities of daily living that may affect Transfer Assistance: Harrel Lemon Lift risk of falls: Patient Identification Verified: Yes Signs or symptoms of abuse/neglect since last visito No Secondary Verification Process Completed: Yes Hospitalized since last visit: No Patient Requires Transmission-Based No Implantable device outside of the clinic excluding No Precautions: cellular tissue based products placed in the center Patient Has Alerts: Yes since last visit: Patient Alerts: NOT Has Dressing in Place as Prescribed: Yes Diabetic Pain Present Now: No Electronic Signature(s) Signed: 07/20/2019 11:53:24 AM By: Lorine Bears RCP, RRT, CHT Entered By: Lorine Bears on 07/20/2019 08:36:54 DRILON, USSERY ([id number]) -------------------------------------------------------------------------------- Encounter Discharge Information Details Patient Name: [name] Date of Service: 07/20/2019 8:00 AM Medical Record Number: [id number] Patient Account Number: [id number] Date of Birth/Sex: [birth year] (68 y.o. M) Treating RN: Army Melia Primary Care [place]: [name] Other Clinician: [name] Referring Yassine Brunsman: [name] Treating Ling Flesch/Extender: [name] in Treatment: 85 Encounter Discharge Information Items Discharge Condition: Stable Ambulatory Status: Wheelchair Discharge Destination: Home Transportation: Private Auto Accompanied By: wife Schedule Follow-up Appointment: Yes Clinical Summary of Care: Notes Patient has an HBO treatment scheduled on 07/25/2019 at 08:00 am. Electronic Signature(s) Signed: 07/20/2019 11:53:24 AM By: Lorine Bears RCP, RRT, CHT Entered By: Lorine Bears on 07/20/2019 11:53:07 JUSTINN, SCHEARER ([id number]) -------------------------------------------------------------------------------- Vitals Details Patient Name: [name] Date of Service: 07/20/2019 8:00 AM Medical Record Number: [id number] Patient Account Number: [id number] Date of Birth/Sex: 11-Jul-1951 (68 y.o. M) Treating RN: Army Melia Primary Care Merrie Epler: [name] Other Clinician: [name] Referring Tempie Gibeault: [name] Treating Jessalyn Hinojosa/Extender: [name] in Treatment: 56 Vital Signs Time Taken: 08:19 Temperature (F): 97.5 Height (in): 73 Pulse (bpm): 66 Weight (lbs): 210 Respiratory Rate (breaths/min): 16 Body Mass Index (BMI): 27.7 Blood Pressure (mmHg): 108/64 Reference Range: 80 - 120 mg / dl Electronic Signature(s) Signed: 07/20/2019 11:53:24 AM By: Lorine Bears RCP, RRT, CHT Entered By: Lorine Bears on 07/20/2019 08:39:32

## 2019-07-21 NOTE — Progress Notes (Signed)
TAHJAE, RUPPEL ([id number]) Visit Report for 07/20/2019 HBO Details Patient Name: [name], [name] Date of Service: 07/20/2019 8:00 AM Medical Record Number: [id number] Patient Account Number: [id number] Date of Birth/Sex: 1950-11-12 (68 y.o. 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 HBO Treatment Details Treatment Number: 59 Patient Type: Outpatient Chamber Type: Monoplace Chamber Serial #: [id number] Treatment Protocol: 2.0 ATA with 90 minutes oxygen, and no air breaks Treatment Details Compression Rate Down: 1.5 psi / minute De-Compression Rate Up: 1.5 psi / minute Compress Tx Pressure Air breaks and breathing periods Decompress Decompress Begins Reached (leave unused spaces blank) Begins Ends Chamber Pressure (ATA) 1 2 - - - - - - 2 1 Clock Time (24 hr) 08:23 08:35 - - - - - - 10:05 10:15 Treatment Length: 112 (minutes) Treatment Segments: 4 Vital Signs Capillary Blood Glucose Reference Range: 80 - 120 mg / dl HBO Diabetic Blood Glucose Intervention Range: <131 mg/dl or >249 mg/dl Time Vitals Blood Respiratory Capillary Blood Glucose Pulse Action Type: Pulse: Temperature: Taken: Pressure: Rate: Glucose (mg/dl): Meter #: Oximetry (%) Taken: Pre 08:19 108/64 66 16 97.5 Post 10:18 110/66. 66 16 97.8 Treatment Response Treatment Toleration: Well Treatment Completion Treatment Completed without Adverse Event Status: [name] Notes No concerns with treatment given HBO Attestation I certify that I supervised this HBO treatment in accordance with Medicare guidelines.  A trained emergency response Yes team is readily available per hospital policies and procedures. Continue HBOT as ordered. [address] [name], [name] ([id number]) Electronic Signature(s) Signed: 07/20/2019 4:48:37 PM By: Linton Ham MD Previous Signature: 07/20/2019 11:53:24 AM Version By: Lorine Bears RCP, RRT, CHT Entered By: Linton Ham on 07/20/2019 16:48:00 [name], [name] ([id number]) -------------------------------------------------------------------------------- HBO Safety Checklist Details Patient Name: [name] Date of Service: 07/20/2019 8:00 AM Medical Record Number: [id number] Patient Account Number: [id number] Date of Birth/Sex: [birth year] (68 y.o. M) Treating RN: Army Melia Primary Care [name]: [name] Other Clinician: [name] Referring [name]: [name] Treating [name]/Extender: [name] in Treatment: 71 HBO Safety Checklist Items Safety Checklist Consent Form Signed Patient voided / foley secured and emptied When did you last eato morning Last dose of injectable or oral agent n/a Ostomy pouch emptied and vented if applicable NA Medtronic Baclofen Pump; Nevro All implantable devices assessed, documented and approved Spinal Stimulator Intravenous access site secured and place NA Valuables secured Linens and cotton and cotton/polyester blend (less than 51% polyester) Personal oil-based products / skin lotions / body lotions removed Wigs or hairpieces removed NA Smoking or tobacco materials removed NA Books / newspapers / magazines / loose paper removed Cologne, aftershave, perfume and deodorant removed Jewelry removed (may wrap wedding band) Make-up removed NA Hair care products removed Battery operated devices (external) removed NPWT pump Heating patches and chemical warmers removed Titanium eyewear removed Nail polish cured greater than 10 hours Casting material cured  greater than 10 hours Hearing aids removed NA Loose dentures or partials removed NA Prosthetics have been removed NA Patient demonstrates correct use of air break device (if applicable) Patient concerns have been addressed Patient grounding bracelet on and cord attached to chamber Specifics for Inpatients (complete in addition to above) Medication sheet sent with patient Intravenous  medications needed or due during therapy sent with patient Drainage tubes (e.g. nasogastric tube or chest tube secured and vented) Endotracheal or Tracheotomy tube secured Cuff deflated of air and inflated with saline Airway suctioned Electronic Signature(s) [name], [name] ([id number]) Signed: 07/20/2019 11:53:24 AM By: Lorine Bears RCP, RRT, CHT Entered By: Lorine Bears on 07/20/2019 08:41:33

## 2019-07-25 ENCOUNTER — Encounter: Payer: Medicare Other | Admitting: Physician Assistant

## 2019-07-25 ENCOUNTER — Encounter: Payer: Medicare Other | Attending: Physician Assistant | Admitting: Physician Assistant

## 2019-07-25 ENCOUNTER — Other Ambulatory Visit: Payer: Self-pay

## 2019-07-25 DIAGNOSIS — L03317 Cellulitis of buttock: Secondary | ICD-10-CM | POA: Diagnosis not present

## 2019-07-25 DIAGNOSIS — Z880 Allergy status to penicillin: Secondary | ICD-10-CM | POA: Insufficient documentation

## 2019-07-25 DIAGNOSIS — Z823 Family history of stroke: Secondary | ICD-10-CM | POA: Insufficient documentation

## 2019-07-25 DIAGNOSIS — Z881 Allergy status to other antibiotic agents status: Secondary | ICD-10-CM | POA: Diagnosis not present

## 2019-07-25 DIAGNOSIS — Z8249 Family history of ischemic heart disease and other diseases of the circulatory system: Secondary | ICD-10-CM | POA: Insufficient documentation

## 2019-07-25 DIAGNOSIS — G8220 Paraplegia, unspecified: Secondary | ICD-10-CM | POA: Insufficient documentation

## 2019-07-25 DIAGNOSIS — I10 Essential (primary) hypertension: Secondary | ICD-10-CM | POA: Insufficient documentation

## 2019-07-25 DIAGNOSIS — M8668 Other chronic osteomyelitis, other site: Secondary | ICD-10-CM | POA: Insufficient documentation

## 2019-07-25 DIAGNOSIS — L89314 Pressure ulcer of right buttock, stage 4: Secondary | ICD-10-CM | POA: Insufficient documentation

## 2019-07-25 DIAGNOSIS — I252 Old myocardial infarction: Secondary | ICD-10-CM | POA: Diagnosis not present

## 2019-07-25 DIAGNOSIS — Z8546 Personal history of malignant neoplasm of prostate: Secondary | ICD-10-CM | POA: Insufficient documentation

## 2019-07-25 NOTE — Progress Notes (Addendum)
[name], [name] ([id number]) Visit Report for 07/25/2019 Arrival Information Details Patient Name: [name], [name] Date of Service: 07/25/2019 10:30 AM Medical Record Number: [id number] Patient Account Number: [id number] Date of Birth/Sex: 01-Feb-1951 (68 y.o. M) Treating RN: [name] Primary Care [name]: [name] Other Clinician: Referring [name]: [name] [name], [name]: 70 Visit Information History Since Last Visit Added or deleted any medications: No Patient Arrived: Wheel Chair Any new allergies or adverse reactions: No Arrival Time: 10:29 Had a fall or experienced change in No Accompanied By: wife activities of daily living that may affect Transfer Assistance: Harrel Lemon Lift risk of falls: Patient Identification Verified: Yes Signs or symptoms of abuse/neglect since last visito No Secondary Verification Process Completed: Yes Hospitalized since last visit: No Patient Requires Transmission-Based No Implantable device outside of the clinic excluding No Precautions: cellular tissue based products placed in the center Patient Has Alerts: Yes since last visit: Patient Alerts: NOT Pain Present Now: No Diabetic Electronic Signature(s) Signed: 07/25/2019 10:29:42 AM By: Gretta Cool, BSN, RN, CWS, Kim RN, BSN Entered By: Gretta Cool, BSN, RN, CWS, Kim on 07/25/2019 10:29:42 [name] ([id number]) -------------------------------------------------------------------------------- Clinic Level of Care Assessment Details Patient Name: [name] Date of Service: 07/25/2019 10:30 AM Medical Record Number: [id number] Patient Account Number: [id number] Date of Birth/Sex: 1951-04-25 (68 y.o. M) Treating RN: [name] Primary Care [name]: [name] Other Clinician: Referring Tanaya Dunigan: [name] Treating Journii Nierman/Extender: [name], [name]: 55 Clinic Level of Care  Assessment Items TOOL 4 Quantity Score []  - Use when only an EandM is performed on FOLLOW-UP visit 0 ASSESSMENTS - Nursing Assessment / Reassessment X - Reassessment of Co-morbidities (includes updates in patient status) 1 10 X- 1 5 Reassessment of Adherence to [name] Plan ASSESSMENTS - Wound and Skin Assessment / Reassessment X - Simple Wound Assessment / Reassessment - one wound 1 5 []  - 0 Complex Wound Assessment / Reassessment - multiple wounds []  - 0 Dermatologic / Skin Assessment (not related to wound area) ASSESSMENTS - Focused Assessment []  - Circumferential Edema Measurements - multi extremities 0 []  - 0 Nutritional Assessment / Counseling / Intervention []  - 0 Lower Extremity Assessment (monofilament, tuning fork, pulses) []  - 0 Peripheral Arterial Disease Assessment (using hand held doppler) ASSESSMENTS - Ostomy and/or Continence Assessment and Care []  - Incontinence Assessment and Management 0 []  - 0 Ostomy Care Assessment and Management (repouching, etc.) PROCESS - Coordination of Care X - Simple Patient / Family Education for ongoing care 1 15 []  - 0 Complex (extensive) Patient / Family Education for ongoing care []  - 0 Staff obtains Programmer, systems, Records, Test Results / Process Orders X- 1 10 Staff telephones HHA, Nursing Homes / Clarify orders / etc []  - 0 Routine Transfer to another Facility (non-emergent condition) []  - 0 Routine Hospital Admission (non-emergent condition) []  - 0 New Admissions / Biomedical engineer / Ordering NPWT, Apligraf, etc. []  - 0 Emergency Hospital Admission (emergent condition) X- 1 10 Simple Discharge Coordination [name], BONDURANT. ([id number]) []  - 0 Complex (extensive) Discharge Coordination PROCESS - Special Needs []  - Pediatric / Minor Patient Management 0 []  - 0 Isolation Patient Management []  - 0 Hearing / Language / Visual special needs []  - 0 Assessment of Community assistance (transportation, D/C  planning, etc.) []  - 0 Additional assistance / Altered mentation []  - 0 Support Surface(s) Assessment (bed, cushion, seat, etc.) INTERVENTIONS - Wound Cleansing / Measurement X - Simple Wound Cleansing - one wound  1 5 []  - 0 Complex Wound Cleansing - multiple wounds X- 1 5 Wound Imaging (photographs - any number of wounds) []  - 0 Wound Tracing (instead of photographs) X- 1 5 Simple Wound Measurement - one wound []  - 0 Complex Wound Measurement - multiple wounds INTERVENTIONS - Wound Dressings X - Small Wound Dressing one or multiple wounds 1 10 []  - 0 Medium Wound Dressing one or multiple wounds []  - 0 Large Wound Dressing one or multiple wounds []  - 0 Application of Medications - topical []  - 0 Application of Medications - injection INTERVENTIONS - Miscellaneous []  - External ear exam 0 []  - 0 Specimen Collection (cultures, biopsies, blood, body fluids, etc.) []  - 0 Specimen(s) / Culture(s) sent or taken to Lab for analysis X- 1 10 Patient Transfer (multiple staff / Civil Service fast streamer / Similar devices) []  - 0 Simple Staple / Suture removal (25 or less) []  - 0 Complex Staple / Suture removal (26 or more) []  - 0 Hypo / Hyperglycemic Management (close monitor of Blood Glucose) []  - 0 Ankle / Brachial Index (ABI) - do not check if billed separately X- 1 5 Vital Signs [name], [name] Has the patient been seen at the hospital within the last three years: Yes Total Score: 95 Level Of Care: New/Established - Level 3 Electronic Signature(s) Signed: 07/25/2019 4:45:12 PM By: [name] Entered By: [name] on 07/25/2019 12:54:17 [name] ([id number]) -------------------------------------------------------------------------------- Encounter Discharge Information Details Patient Name: [name] Date of Service: 07/25/2019 10:30 AM Medical Record Number: [id number] Patient Account Number: [id number] Date of Birth/Sex: 07-10-1951 (68  y.o. M) Treating RN: Army Melia Primary Care Misk Galentine: [name] Other Clinician: Referring Tomislav Micale: [name] Treating Dominik Yordy/Extender: [name], [name]: 48 Encounter Discharge Information Items Discharge Condition: Stable Ambulatory Status: Wheelchair Discharge Destination: Home Transportation: Private Auto Accompanied By: wife Schedule Follow-up Appointment: Yes Clinical Summary of Care: Electronic Signature(s) Signed: 07/26/2019 9:32:13 AM By: Army Melia Entered By: Army Melia on 07/25/2019 11:46:07 [name] ([id number]) -------------------------------------------------------------------------------- Lower Extremity Assessment Details Patient Name: [name] Date of Service: 07/25/2019 10:30 AM Medical Record Number: [id number] Patient Account Number: [id number] Date of Birth/Sex: 1950/11/17 (68 y.o. M) Treating RN: [name] Primary Care Jordanne Elsbury: [name] Other Clinician: Referring Angell Honse: [name] Treating Shailene Demonbreun/Extender: Worthy Keeler Weeks in [name]: 59 Electronic Signature(s) Signed: 07/25/2019 10:33:19 AM By: Gretta Cool, BSN, RN, CWS, Kim RN, BSN Entered By: Gretta Cool, BSN, RN, CWS, Kim on 07/25/2019 10:33:18 [name], [name] ([id number]) -------------------------------------------------------------------------------- Multi Wound Chart Details Patient Name: [name] Date of Service: 07/25/2019 10:30 AM Medical Record Number: [id number] Patient Account Number: [id number] Date of Birth/Sex: 02-10-1951 (68 y.o. M) Treating RN: [name] Primary Care Amaziah Ghosh: [name] Other Clinician: Referring Marland Reine: [name] Treating Monnie Gudgel/Extender: [name], [name]: 52 Photos: [1:No Photos] [N/A:N/A] Wound Location: [1:Right Gluteal fold] [N/A:N/A] Wounding Event: [1:Pressure Injury] [N/A:N/A] Primary Etiology: [1:Pressure Ulcer]  [N/A:N/A] Comorbid History: [1:Cataracts, Hypertension, Paraplegia] [N/A:N/A] Date Acquired: [1:11/02/2017] [N/A:N/A] Weeks of [name]: [1:86] [N/A:N/A] Wound Status: [1:Open] [N/A:N/A] Measurements L x W x D [1:2.5x1.5x2.8] [N/A:N/A] (cm) Area (cm) : [1:2.945] [N/A:N/A] Volume (cm) : [1:8.247] [N/A:N/A] % Reduction in Area: [1:70.70%] [N/A:N/A] % Reduction in Volume: [1:-720.60%] [N/A:N/A] Position 1 (o'clock): [1:12] Maximum Distance 1 (cm): [1:3.8] Tunneling: [1:Yes] [N/A:N/A] Classification: [1:Category/Stage IV] [N/A:N/A] Exudate Amount: [1:Large] [N/A:N/A] Exudate Type: [1:Serous] [N/A:N/A] Exudate Color: [1:amber] [N/A:N/A] Wound Margin: [1:Epibole] [N/A:N/A] Granulation Amount: [1:Large (67-100%)] [N/A:N/A] Granulation Quality: [1:Pink, Hyper-granulation] [  N/A:N/A] Necrotic Amount: [1:Small (1-33%)] [N/A:N/A] Exposed Structures: [1:Fat Layer (Subcutaneous Tissue) Exposed: Yes Muscle: Yes Fascia: No Tendon: No Joint: No Bone: No] [N/A:N/A] Epithelialization: [1:Small (1-33%)] [N/A:N/A] Assessment Notes: [1:maceration around edges of wound.] [N/A:N/A] [name] Notes Wound #1 (Right Gluteal fold) Notes zinc to peri wound, aquacell, abd and tape [name], [name] ([id number]) Electronic Signature(s) Signed: 07/25/2019 12:53:21 PM By: [name] Entered By: [name] on 07/25/2019 12:53:20 [name] ([id number]) -------------------------------------------------------------------------------- [place] Details Patient Name: [name] Date of Service: 07/25/2019 10:30 AM Medical Record Number: [id number] Patient Account Number: [id number] Date of Birth/Sex: 1950/10/30 (68 y.o. M) Treating RN: [name] Primary Care Mart Colpitts: [name] Other Clinician: Referring [place] Kappes: [name] Treating Siona Coulston/Extender: [name], [name]: 45 Active Inactive Orientation to the Wound Care  Program Nursing Diagnoses: Knowledge deficit related to the wound healing center program Goals: Patient/caregiver will verbalize understanding of the [facility] Program Date Initiated: 11/30/2017 Target Resolution Date: 12/21/2017 Goal Status: Active Interventions: Provide education on orientation to the wound center Notes: Pressure Nursing Diagnoses: Knowledge deficit related to management of pressures ulcers Goals: Patient/caregiver will verbalize understanding of pressure ulcer management Date Initiated: 05/17/2018 Target Resolution Date: 05/28/2018 Goal Status: Active Interventions: Assess: immobility, friction, shearing, incontinence upon admission and as needed Notes: Wound/Skin Impairment Nursing Diagnoses: Impaired tissue integrity Goals: Patient/caregiver will verbalize understanding of skin care regimen Date Initiated: 11/30/2017 Target Resolution Date: 12/21/2017 Goal Status: Active Ulcer/skin breakdown will have a volume reduction of 30% by week 4 Date Initiated: 11/30/2017 Target Resolution Date: 12/21/2017 Goal Status: Active [name], [name] ([id number]) Interventions: Assess patient/caregiver ability to obtain necessary supplies Assess patient/caregiver ability to perform ulcer/skin care regimen upon admission and as needed Assess ulceration(s) every visit [name] Activities: Skin care regimen initiated : 11/30/2017 Notes: Electronic Signature(s) Signed: 07/25/2019 12:53:11 PM By: [name] Entered By: [name] on 07/25/2019 12:53:11 [name] ([id number]) -------------------------------------------------------------------------------- Pain Assessment Details Patient Name: [name] Date of Service: 07/25/2019 10:30 AM Medical Record Number: [id number] Patient Account Number: [id number] Date of Birth/Sex: 1951-06-08 (68 y.o. M) Treating RN: [name] Primary Care Tery Hoeger: [name] Other  Clinician: Referring Vallarie Fei: [name] Treating Jhordan Mckibben/Extender: [name], [name]: 29 Active Problems Location of Pain Severity and Description of Pain Patient Has Paino No Site Locations Pain Management and Medication Current Pain Management: Electronic Signature(s) Signed: 07/25/2019 10:29:49 AM By: Gretta Cool, BSN, RN, CWS, Kim RN, BSN Entered By: Gretta Cool, BSN, RN, CWS, Kim on 07/25/2019 10:29:48 [name] ([id number]) -------------------------------------------------------------------------------- Patient/Caregiver Education Details Patient Name: [name] Date of Service: 07/25/2019 10:30 AM Medical Record Number: [id number] Patient Account Number: [id number] Date of Birth/Gender: [birth year] (68 y.o. M) Treating RN: [name] Primary Care Physician: [name] Other Clinician: Referring Physician: Tracie [name] Treating Physician/Extender: Sharalyn Ink in [name]: 72 Education Assessment Education Provided To: Patient Education Topics Provided Wound/Skin Impairment: Handouts: Caring for Your Ulcer Methods: Demonstration, Explain/Verbal Responses: State content correctly Electronic Signature(s) Signed: 07/25/2019 4:45:12 PM By: [name] Entered By: [name] on 07/25/2019 12:53:38 [name] ([id number]) -------------------------------------------------------------------------------- Wound Assessment Details Patient Name: [name] Date of Service: 07/25/2019 10:30 AM Medical Record Number: [id number] Patient Account Number: [id number] Date of Birth/Sex: 1950/08/22 (68 y.o. M) Treating RN: [name] Primary Care Jhordyn Hoopingarner: [name] Other Clinician: Referring Heman Que: [name] Treating Donne Baley/Extender: [name], [name]: 86 Wound Status Wound Number: 1 Primary Etiology: Pressure Ulcer Wound Location: Right  Gluteal fold Wound Status:  Open Wounding Event: Pressure Injury Comorbid History: Cataracts, Hypertension, Paraplegia Date Acquired: 11/02/2017 Weeks Of [name]: 86 Clustered Wound: No Wound Measurements Length: (cm) 2.5 % Reducti Width: (cm) 1.5 % Reducti Depth: (cm) 2.8 Epithelia Area: (cm) 2.945 Tunnelin Volume: (cm) 8.247 Posit Maximu on in Area: 70.7% on in Volume: -720.6% lization: Small (1-33%) g: Yes ion (o'clock): 12 m Distance: (cm) 3.8 Wound Description Classification: Category/Stage IV Wound Margin: Epibole Exudate Amount: Large Exudate Type: Serous Exudate Color: amber Foul Odor After Cleansing: No Slough/Fibrino Yes Wound Bed Granulation Amount: Large (67-100%) Exposed Structure Granulation Quality: Pink, Hyper-granulation Fascia Exposed: No Necrotic Amount: Small (1-33%) Fat Layer (Subcutaneous Tissue) Exposed: Yes Necrotic Quality: Adherent Slough Tendon Exposed: No Muscle Exposed: Yes Necrosis of Muscle: No Joint Exposed: No Bone Exposed: No Assessment Notes maceration around edges of wound. [name] Notes Wound #1 (Right Gluteal fold) Notes zinc to peri wound, aquacell, abd and tape Electronic Signature(s) Signed: 07/25/2019 10:32:52 AM By: Gretta Cool, BSN, RN, CWS, Kim RN, BSN [address] [place] ([id number]) Entered By: Gretta Cool, BSN, RN, CWS, Kim on 07/25/2019 10:32:51

## 2019-07-25 NOTE — Progress Notes (Addendum)
[name], [name] ([id number]) Visit Report for 07/25/2019 Chief Complaint Document Details Patient Name: Brett, [name] Date of Service: 07/25/2019 10:30 AM Medical Record Number: [id number] Patient Account Number: [id number] Date of Birth/Sex: 04-25-1951 (68 y.o. M) Treating RN: Harold Barban Primary Care Provider: [name] Other Clinician: Referring Provider: [name] Treating Provider/Extender: [name], Miniya Miguez Weeks in Treatment: 31 Information Obtained from: Patient Chief Complaint Right gluteal fold ulcer Electronic Signature(s) Signed: 07/25/2019 10:39:52 AM By: Worthy Keeler [name] Entered By: Worthy Keeler on 07/25/2019 10:39:52 [name], [name] ([id number]) -------------------------------------------------------------------------------- HPI Details Patient Name: [name] Date of Service: 07/25/2019 10:30 AM Medical Record Number: [id number] Patient Account Number: [id number] Date of Birth/Sex: 08-25-1950 (68 y.o. 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 He states that there is no history of diabetes that he is aware of. He does have issues with his prostate and is currently receiving treatment for this by way of oral medication. With that being said I do not have a lot of details in that regard. Nonetheless the patient presents today as a result of having been referred to [place] by another provider initially home health was set to come out and take care of his wound although due to the fact that  he apparently drives he's not able to receive home health. His wife is therefore trying to help take care of this wound within although they have been struggling with what exactly to do at this point. She states that she can do some things but she is definitely not a nurse and does have some issues with looking at blood. The good news is the wound does not appear to be too deep and is fairly superficial at this point. There is no slough noted there is some nonviable skin noted around the surface of the wound and the perimeter at this point. The central portion of the wound appears to be very good with a dermal layer noted this does not appear to be again deep enough to extend it to subcutaneous tissue at this point. 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 In the pump he has Dilaudid and baclofen. 12/07/17 on evaluation today patient presents for follow-up concerning his ongoing lower back thigh ulcer on the right. He states that he did not get the supplies ordered and therefore has not really been able to perform the dressing changes as directed exactly. His wife was able to get some Boarder Foam Dressing's from the drugstore and subsequently has been using hydrogel which did help to a degree in the wound does appear to be able smaller. There is actually more drainage this week noted than previous. 12/21/17 on evaluation today patient appears to be doing rather well in regard to his right gluteal ulcer. He has been tolerating the dressing changes without complication. There does not appear to be any evidence of infection at this point in time. Overall the wound does seem to be making some progress as far as the edges  are concerned there's not as much in the way of overlapping of the external wound edges and he has a good epithelium to wound bed border for the most part. This however is not true  right at the 12 o'clock location over the span of a little over a centimeters which actually will require debridement today to clean this away and hopefully allow it to continue to heal more appropriately. 12/28/17 on evaluation today patient appears to be doing rather well in regard to his ulcer in the left gluteal region. He's been tolerating the dressing changes without complication. Apparently he has had some difficulty getting his dressing material. Apparently there's been some confusion with ordering we're gonna check into this. Nonetheless overall he's been showing signs of improvement which is good news. Debridement is not required today. 01/04/18 on evaluation today patient presents for follow-up concerning his right gluteal ulcer. He has been tolerating the dressing changes fairly well. On inspection today it appears he may actually have some maceration them concerned about the fact that he may be developing too much moisture in and around the wound bed which can cause delay in healing. With that being said he unfortunately really has not showed significant signs of improvement since last week's evaluation in fact this may even be just the little bit/slightly larger. Nonetheless he's been having a lot of discomfort I'm not sure this is even related to the wound as he has no pain when I'm to breeding or otherwise cleaning the wound during evaluation today. Nonetheless this is something that we did recommend he talked to his pain specialist concerning. 01/11/18 on evaluation today patient appears to be doing better in regard to his ulceration. He has been tolerating the dressing changes without complication. With that being said overall there's no evidence of infection which is good news. The only thing is he did receive the hatch affair blue classic versus the ready nonetheless I feel like this is perfectly fine and appears to have done well for him over the past week. 01/25/18 on evaluation  today patient's wound actually appears to be a little bit larger than during the last evaluation. The good [name], [name]. ([id number]) news is the majority of the wound edges actually appear to be fairly firmly attached to the wound bed unfortunately again we're not really making progress in regard to the size. Roughly the wound is about the same size as when I first saw him although again the wound margin/edges appear to be much better. 02/01/18 on evaluation today patient actually appears to be doing very well in regard to his wound. Applying the Prisma dry does seem to be better although he does still have issues with slow progression of the wound. There was a slight improvement compared to last week's measurements today. Nonetheless I have been considering other options as far as the possibility of Theraskin or even a snap vac. In general I'm not sure that the Theraskin due to location of the wound would be a very good idea. Nonetheless I do think that a snap vac could be a possibility for the patient and in fact I think this could even be an excellent way to manage the wound possibly seeing some improvement in a very rapid fashion here. Nonetheless this is something that we would need to get approved and I did have a lengthy conversation with the patient about this today. 02/08/18 on evaluation today patient appears to be doing a little better in regard to his ulcer.  He has been tolerating the dressing changes without complication. Fortunately despite the fact that the wound is a little bit smaller it's not significantly so unfortunately. We have discussed the possibility of a snap vac we did check with insurance this is actually covered at this point. Fortunately there does not appear to be any sign of infection. Overall I'm fairly pleased with how things seem to be appearing at this point. 02/15/18 on evaluation today patient appears to be doing rather well in regard to his right gluteal  ulcer. Unfortunately the snap vac did not stay in place with his sheer and friction this came loose and did not seem to maintain seal very well. He worked for about two days and it did seem to do very well during that time according to his wife but in general this does not seem to be something that's gonna be beneficial for him long-term. I do believe we need to go back to standard dressings to see if we can find something that will be of benefit. 03/02/18- He is here in follow up evaluation; there is minimal change in the wound. He will continue with the same treatment plan, would consider changing to iodosrob/iodoflex if ulcer continues to to plateau. He will follow up next week 03/08/18 on evaluation today patient's wound actually appears to be about the same size as when I previously saw him several weeks back. Unfortunately he does have some slightly dark discoloration in the central portion of the wound which has me concerned about pressure injury. I do believe he may be sitting for too long a period of time in fact he tells me that "I probably sit for much too long". He does have some Slough noted on the surface of the wound and again as far as the size of the wound is concerned I'm really not seeing anything that seems to have improved significantly. 03/15/18 on evaluation today patient appears to be doing fairly well in regard to his ulcer. The wound measured pretty much about the same today compared to last week's evaluation when looking at his graph. With that being said the area of bruising/deep tissue injury that was noted last week I do not see at this point. He did get a new cushion fortunately this does seem to be have been of benefit in my pinion. It does appear that he's been off of this more which is good news as well I think that is definitely showing in the overall wound measurements. With that being said I do believe that he needs to continue to offload I don't think that the fact  this is doing better should be or is going to allow him to not have to offload and explain this to him as well. Overall he seems to be in agreement the plan I think he understands. The overall appearance of the wound bed is improved compared to last week I think the Iodoflex has been beneficial in that regard. 03/29/18 on evaluation today patient actually appears to be doing rather well in regard to his wound from the overall appearance standpoint he does have some granulation although there's some Slough on the surface of the wound noted as well. With that being said he unfortunately has not improved in regard to the overall measurement of the wound in volume or in size. I did have a discussion with him very specifically about offloading today. He actually does work although he mainly is just sitting throughout the day. He tells me  he offloads by "lifting himself up for 30 seconds off of his chair occasionally" purchase from [employer] which does seem to have helped. And he has a new cushion that he with that being said he's also able to stand some for a very short period of time but not significant enough I think to provide appropriate offloading. I think the biggest issue at this point with the wound and the fact is not healing as quickly as we would like is due to the fact that he is really not able to appropriately offload while at work. He states the beginning after his injury he actually had a bed at his job that he could lay on in order to offload and that does seem to have been of help back at that time. Nonetheless he had not done this in quite some time unfortunately. I think that could be helpful for him this is something I would like for him to look into. 04/05/18 on evaluation today patient actually presents for follow-up concerning his right gluteal ulcer. Again he really is not significantly improved even compared to last week. He has been tolerating the dressing changes without  complication. With that being said fortunately there appears to be no evidence of infection at this time. He has been more proactive in trying to offload. [name], [name] ([id number]) 04/12/18 on evaluation today patient actually appears to be doing a little better in regard to his wound and the right gluteal fold region. He's been tolerating the dressing changes since removing the oasis without complication. However he was having a lot of burning initially with the oasis in place. He's unsure of exactly why this was given so much discomfort but he assumes that it was the oasis itself causing the problem. Nonetheless this had to be removed after about three days in place although even those three days seem to have made a fairly good improvement in regard to the overall appearance of the wound bed. In fact is the first time that he's made any improvement from the standpoint of measurements in about six weeks. He continues to have no discomfort over the area of the wound itself which leads me to wonder why he was having the burning with the oasis when he does not even feel the actual debridement's themselves. I am somewhat perplexed by this. 04/19/18 on evaluation today patient's wound actually appears to be showing signs of epithelialization around the edge of the wound and in general actually appears to be doing better which is good news. He did have the same burning after about three days with applying the Endoform last week in the same fashion that I would generally apply a skin substitute. This seems to indicate that it's not the oasis to cause the problem but potentially the moisture buildup that just causes things to burn or there may be some other reaction with the skin prep or Steri-Strips. Nonetheless I'm not sure that is gonna be able to tolerate any skin substitute for a long period of time. The good news is the wound actually appears to be doing better today compared to last week and  does seem to finally be making some progress. 04/26/18 on evaluation today patient actually appears to be doing rather well in regard to his ulcer in the right gluteal fold. He has been tolerating the dressing changes without complication which is good news. The Endoform does seem to be helping although he was a little bit more macerated this week. This  seems to be an ongoing issue with fluid control at this point. Nonetheless I think we may be able to add something like Drawtex to help control the drainage. 05/03/18 on evaluation today patient appears to actually be doing better in regard to the overall appearance of his wound. He has been tolerating the dressing changes without complication. Fortunately there appears to be no evidence of infection at this time. I really feel like his wound has shown signs as of today of turning around last week I thought so as well and definitely he could be seen in this week's overall appearance and measurements. In general I'm very pleased with the fact that he finally seems to be making a steady but sure progress. The patient likewise is very pleased. 05/17/18 on evaluation today patient appears to be doing more poorly unfortunately in regard to his ulcer. He has been tolerating the dressing changes without complication. With that being said he tells me that in the past couple of days he and his wife have noticed that we did not seem to be doing quite as well is getting dark near the center. Subsequently upon evaluation today the wound actually does appear to be doing worse compared to previous. He has been tolerating the dressing changes otherwise and he states that he is not been sitting up anymore than he was in the past from what he tells me. Still he has continued to work he states "I'm tired of dealing with this and if I have to just go home and lay in the bed all the time that's what I'll do". Nonetheless I am concerned about the fact that this wound does  appear to be deeper than what it was previous. 05/24/18 upon evaluation today patient actually presents after having been in the hospital due to what was presumed to be sepsis secondary to the wound infection. He had an elevated white blood cell count between 14 and 15. With that being said he does seem to be doing somewhat better now. His wound still is giving him some trouble nonetheless and he is obviously concerned about the fact likely talked about that this does seem to go more deeply than previously noted. I did review his wound culture which showed evidence of Staphylococcus aureus him and group B strep. Nonetheless he is on antibiotics, Levaquin, for this. Subsequently I did review his intake summary from the hospital as well. I also did look at the CT of the lumbar spine with contrast that was performed which showed no bone destruction to suggest lumbar disguises/osteomyelitis or sacral osteomyelitis. There was no paraspinal abscess. Nonetheless it appears this may have been more of just a soft tissue infection at this point which is good news. He still is nonetheless concerned about the wound which again I think is completely reasonable considering everything he's been through recently. 05/31/18 on evaluation today on evaluation today patient actually appears to be showing signs of his wound be a little bit deeper than what I would like to see. Fortunately he does not show any signs of significant infection although his temperature was 99 today he states he's been checking this at home and has not been elevated. Nonetheless with the undermining that I'm seeing at this point I am becoming more concerned about the wound I do think that offloading is a key factor here that is preventing the speedy recovery at this point. There does not appear to be any evidence of again over infection noted. He's been using  Santyl currently. 06/07/18 the patient presents today for follow-up evaluation  regarding the left ulcer in the gluteal region. He has been tolerating the Wound VAC fairly well. He is obviously very frustrated with this he states that to mean is really getting in his way. There does not appear to be any evidence of infection at this time he does have a little bit of odor I do not necessarily associate this with infection just something that we sometimes notice with Wound VAC therapy. With that being said I can definitely catch a tone of discontentment overall in the patient's demeanor today. This when he was previously in the hospital [name], [name]. ([id number]) an CT scan was done of the lumbar region which did not reveal any signs of osteomyelitis. With that being said the pelvis in particular was not evaluated distinctly which means he could still have some osteonecrosis I. Nonetheless the Wound VAC was started on Thursday I do want to get this little bit more time before jumping to a CT scan of the pelvis although that is something that I might would recommend if were not see an improvement by that time. 06/14/18 on evaluation today patient actually appears to be doing about the same in regard to his right gluteal ulcer. Again he did have a CT scan of the lumbar spine unfortunately this did not include the pelvis. Nonetheless with the depth of the wound that I'm seeing today even despite the fact that I'm not seeing any evidence of overt cellulitis I believe there's a good chance that we may be dealing with osteomyelitis somewhere in the right Ischial region. No fevers, chills, nausea, or vomiting noted at this time. 06/21/18 on evaluation today patient actually appears to be doing about the same with regard to his wound. The tunnel at 6 o'clock really does not appear to be any deeper although it is a little bit wider. I think at this point you may want to start packing this with white phone. Unfortunately I have not got approval for the CT scan of the pelvis as of yet  due to the fact that Medicare apparently has been denied it due to the diagnosis codes not being appropriate according to Medicare for the test requested. With that being said the patient cannot have an MRI and therefore this is the only option that we have as far as testing is concerned. The patient has had infection and was on antibiotics and been added code for cellulitis of the bottom to see if this will be appropriate for getting the test approved. Nonetheless I'm concerned about the infection have been spread deeper into the Ischial region. 06/28/18 on evaluation today patient actually appears to be doing rather well all things considered in regard to the right gluteal ulcer. He has been tolerating the dressing changes without complication. With that being said the Wound VAC he states does have to be replaced almost every day or at least reinforced unfortunately. Patient actually has his CT scan later this morning we should have the results by tomorrow. 07/05/18 on evaluation today patient presents for follow-up concerning his right Ischial ulcer. He did see the surgeon Dr. Lysle Pearl last week. They were actually very happy with him and felt like he spent a tremendous amount of time with them as far as discussing his situation was concerned. In the end Dr. Lysle Pearl did contact me as well and determine that he would not recommend any surgical intervention at this point as he felt like  it would not be in the patient's best interest based on what he was seeing. He recommended a referral to infectious disease. Subsequently this is something that Dr. Ines Bloomer office is working on setting up for the patient. As far as evaluation today is concerned the patient's wound actually appears to be worse at this point. I am concerned about how things are progressing and specifically about infection. I do not feel like it's the deeper but the area of depth is definitely widening which does have me concerned. No  fevers, chills, nausea, or vomiting noted at this time. I think that we do need initiate antibiotic therapy the patient has an allow allergy to amoxicillin/penicillin he states that he gets a rash since childhood. Nonetheless she's never had the issues with Catholics or cephalosporins in general but he is aware of. 07/27/18 on evaluation today patient presents following admission to the hospital on 07/09/18. He was subsequently discharged on 07/20/18. On 07/15/18 the patient underwent irrigation and debridement was soft tissue biopsy and bone biopsy as well as placement of a Wound VAC in the OR by Dr. Celine Ahr. During the hospital course the patient was placed on a Wound VAC and recommended follow up with surgery in three weeks actually with Dr. Delaine Lame who is infectious disease. The patient was on vancomycin during the hospital course. 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 He also had a bone culture which revealed evidence of methicillin-resistant staph aureus. He is updated CT scan 07/09/18 reveals that he had progression of the which was performed on wound to breakdown down to the trochanter where he actually had irregularities there as well suggestive of osteomyelitis. This was a change just since 9 December when we last performed a CT scan. Obviously this one had gone downhill quite significantly and rapidly. At this point upon evaluation I feel like in general the patient's wound seems to be doing fairly well all things considered upon my evaluation today. Obviously this is larger and deeper than what I previously evaluated but at the same time he seems to be making some progress as far as the appearance of the granulation tissue is concerned. I'm happy in that regard. No fevers, chills, nausea, or vomiting noted at this time. He is on IV vancomycin and Rocephin at the facility. He is currently in [employer]. 08/03/18 upon evaluation today  patient's wound appears to be doing better in regard to the overall appearance at this point in time. Fortunately he's been tolerating the Wound VAC without complication and states that the facility has been taking excellent care of the wound site. Overall I see some Slough noted on the surface which I am going to attempt sharp debridement today of but nonetheless other than this I feel like he's making progress. 08/09/18 on evaluation today patient's wound appears to be doing much better compared to even last week's evaluation. Do believe that the Wound VAC is been of great benefit for him. He has been tolerating the dressing changes that is the Wound [name], [name]. ([id number]) VAC without any complication and he has excellent granulation noted currently. There is no need for sharp debridement at this point. 08/16/18 on evaluation today patient actually appears to be doing very well in regard to the wound in the right gluteal fold region. This is showing signs of progress and again appears to be very healthy which is excellent news. Fortunately there is no sign of active infection by way of odor  or drainage at this point. Overall I'm very pleased with how things stand. He seems to be tolerating the Wound VAC without complication. 08/23/18 on evaluation today patient actually appears to be doing better in regard to his wound. He has been tolerating the Wound VAC without complication and in fact it has been collecting a significant amount of drainage which I think is good news especially considering how the wound appears. Fortunately there is no signs of infection at this time definitely nothing appears to be worse which is good news. He has not been started on the Bactrim and Flagyl that was recommended by Dr. Delaine Lame yet. I did actually contact her office this morning in order to check and see were things are that regard their gonna be calling me back. 08/30/18 on evaluation today patient  actually appears to show signs of excellent improvement today compared to last evaluation. The undermining is getting much better the wound seems to be feeling quite nicely and I'm very pleased that the granulation in general. With that being said overall I feel like the patient has made excellent progress which is great news. No fevers, chills, nausea, or vomiting noted at this time. 09/06/18 on evaluation today patient actually appears to be doing rather well in regard to his right gluteal ulcer. This is showing signs of improvement in overall I'm very pleased with how things seem to be progressing. The patient likewise is please. Overall I see no evidence of infection he is about to complete his oral antibiotic regimen which is the end of the antibiotics for him in just about three days. 09/13/18 on evaluation today patient's right Ischial ulcer appears to be showing signs of continued improvement which is excellent news. He's been tolerating the dressing changes without complication. Fortunately there's no signs of infection and the wound that seems to be doing very well. 09/28/18 on evaluation today patient appears to be doing rather well in regard to his right Ischial ulcer. He's been tolerating the Wound VAC without complication he knows there's much less drainage than there used to be this obviously is not a bad thing in my pinion. There's no evidence of infection despite the fact is but nothing about it now for several weeks. 10/04/18 on evaluation today patient appears to be doing better in regard to his right Ischial wound. He has been tolerating the Wound VAC without complication and I do believe that the silver nitrate last week was beneficial for him. Fortunately overall there's no evidence of active infection at this time which is great news. No fevers, chills, nausea, or vomiting noted at this time. 10/11/18 on evaluation today patient actually appears to be doing rather well in regard  to his Ischial ulcer. He's been tolerating the Wound VAC still without complication I feel like this is doing a good job. No fevers, chills, nausea, or vomiting noted at this time. 11/01/18 on evaluation today patient presents after having not been seen in our clinic for several weeks secondary to the fact that he was on evaluation today patient presents after having not been seen in our clinic for several weeks secondary to the fact that he was in a skilled nursing facility which was on lockdown currently due to the covert 19 national emergency. Subsequently he was discharged from the facility on this past Friday and subsequently made an appointment to come in to see yesterday. Fortunately there's no signs of active infection at this time which is good news and overall he does  seem to have made progress since I last saw. Overall I feel like things are progressing quite nicely. The patient is having no pain. 11/08/18 on evaluation today patient appears to be doing okay in regard to his right gluteal ulcer. He has been utilizing a Wound VAC home health this changing this at this point since he's home from the skilled nursing facility. Fortunately there's no signs of obvious active infection at this time. Unfortunately though there's no obvious active infection he is having some maceration and his wife states that when the sheets of the Wound VAC office on Sunday when it broke seal that he ended up having significant issues with some smell as well there concerned about the possibility of infection. Fortunately there's No fevers, chills, nausea, or vomiting noted at this time. 11/15/18 on evaluation today patient actually appears to be doing well in regard to his right gluteal ulcer. He has been tolerating the dressing changes without complication. Specifically the Wound VAC has been utilized up to this point. Fortunately there's no signs of infection and overall I feel like he has made progress even  since last week when I last saw him. I'm actually fairly happy with the overall appearance although he does seem to have somewhat of a hyper granular Shuck, Dayven J. ([id number]) overgrowth in the central portion of the wound which I think may require some sharp debridement to try flatness out possibly utilizing chemical cauterization following. 11/23/18 on evaluation today patient actually appears to be doing very well in regard to his sacral ulcer. He seems to be showing signs of improvement with good granulation. With that being said he still has the small area of hyper granulation right in the central portion of the wound which I'm gonna likely utilize silver nitrate on today. Subsequently he also keeps having a leak at the 6 o'clock location which is unfortunate we may be able to help out with some suggestions to try to prevent this going forward. Fortunately there's no signs of active infection at this time. 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 He's been tolerating the dressing changes without complication. Fortunately there's no signs of active infection at this time. I've been rather pleased with how things have progressed there still some evidence of pressure getting to the area with some redness right around the immediate wound opening. Nonetheless other than this I'm not seeing any significant complications or issues the wound is somewhat hyper granular. Upon discussing with the patient and his wife today I'm not sure that the wound is being packed to the base with the foam at this point. And if it's not been packed fully that may be part of the reason why is not seen as much improvement as far as the granulation from the base out. Again we do not want pack too tightly but we need some of the firm to get to the base of the wound. I discussed this with patient and his wife  today. 12/06/18 on evaluation today patient appears to be doing well in regard to his right gluteal pressure ulcer. He's been tolerating the dressing changes without complication. Fortunately there's no signs of active infection. He still has some hyper granular tissue and I do think it would be appropriate to continue with the chemical cauterization as of today. 12/16/18 on evaluation today patient actually appears to be doing okay in regard to his right  gluteal ulcer. He is been tolerating the dressing changes without complication including the Wound VAC. Overall I feel like nothing seems to be worsening I do feel like that the hyper granulation buds in the central portion of the wound have improved to some degree with the silver nitrate. We will have to see how things continue to progress. 12/20/18 on evaluation today patient actually appears to be doing much worse in my pinion even compared to last week's evaluation. Unfortunately as opposed to showing any signs of improvement the areas of hyper granular tissue in the central portion of the wound seem to be getting worse. Subsequently the wound bed itself also seems to be getting deeper even compared to last week which is both unusual as well as concerning since prior he had been shown signs of improvement. Nonetheless I think that the issue could be that he's actually having some difficulty in issues with a deeper infection. There's no external signs of infection but nonetheless I am more worried about the internal, osteomyelitis, that could be restarting. He has not been on antibiotics for some time at this point. I think that it may be a good idea to go ahead and started back on an antibiotic therapy while we wait to see what the testing shows. 12/27/18 on evaluation today patient presents for follow-up concerning his left gluteal fold wound. Fortunately he appears to be doing well today. I did review the CT scan which was negative for any signs of  osteomyelitis or acute abnormality this is excellent news. Overall I feel like the surface of the wound bed appears to be doing significantly better today compared to previously noted findings. There does not appear any signs of infection nor does he have any pain at this time. 01/03/19 on evaluation today patient actually appears to be doing quite well in regard to his ulcer. Post debridement last week he really did not have too much bleeding which is good news. Fortunately today this seems to be doing some better but we still has some of the hyper granular tissue noted in the base of the wound which is gonna require sharp debridement today as well. Overall I'm pleased with how things seem to be progressing since we switched away from the Wound VAC I think he is making some progress. 01/10/19 on evaluation today patient appears to be doing better in regard to his right gluteal fold ulcer. He has been tolerating the dressing changes without complication. The debridement to seem to be helping with current away some of the poor hyper granular tissue bugs throughout the region of his gluteal fold wound. He's been tolerating the dressing changes otherwise without complication which is great news. No fevers, chills, nausea, or vomiting noted at this time. 01/17/19 on evaluation today patient actually appears to be doing excellent in regard to his wound. He's been tolerating the dressing changes without complication. Fortunately there is no signs of active infection at this time which is great news. No fevers, chills, nausea, or vomiting noted at this time. 01/24/19 on evaluation today patient actually appears to be doing quite well with regard to his ulcer. He has been tolerating the dressing changes without complication. Fortunately there's no signs of active infection at this time. Overall been very pleased with the progress that he seems to be making currently. 01/31/19-Patient returns at 1 week with  apparent similarity in dimensions to the wound, with no signs of infection, he has been [place] ([id number]) changing dressings twice  a day 02/08/19 upon evaluation today patient actually appears to be doing well with regard to his right Ischial ulcer. The wound is not appear to be quite as deep and seems to be making progress which is good news. With that being said I'm still reluctant to go back to the Wound VAC at this point. He's been having to change the dressings twice a day which is a little bit much in my pinion from the wound care supplies standpoint. I think that possibly attempting to utilize extras orbit may be beneficial this may also help to prevent any additional breakdown secondary to fluid retention in the wound itself. The patient is in agreement with giving this a try. 02/15/19 on evaluation today patient actually appears to be doing decently well with regard to his ulcer in the right to gluteal fold location. He's been tolerating the dressing changes without complication. Fortunately there is no signs of active infection at this time. He is able to keep the current dressing in place more effectively for a day at a time whereas before he was having a changes to to three times a day. The actions or has been helpful in this regard. Fortunately there's no signs of anything getting worse and I do feel like he showing signs of good improvement with regard to the wound bed status. 02/22/2019 patient appears to be doing very well today with regard to his ulcer in the gluteal fold. Fortunately there is no signs of active infection and he has been tolerating the dressing changes without any complication. Overall extremely pleased with how things seem to be progressing. He has much less of the hyper granular projections within the wound these have slowly been debrided away and he seems to be doing well. The wound bed is more uniform. 03/01/19 on evaluation today patient appears to  be doing unfortunately about the same in regard to his gluteal ulcer. He's been tolerating the dressing changes without complication. Fortunately there's no signs of active infection at this time. With that being said he continues to develop these hyper granular projections which I'm unsure of exactly what they are and why they are rising. Nonetheless I explained to the patient that I do believe it would be a good idea for [place] to stand a biopsy sample for pathology to see if that can shed any light on what exactly may be going on here. Fortunately I do not see any obvious signs of infection. With that being said the patient has had a little bit more drainage this week apparently compared to last week. 03/08/2019 on evaluation today patient actually appears to look somewhat better with regard to the appearance of his wound bed at this time. This is good news. Overall I am very pleased with how things seem to have progressed just in the past week with a switch to the [facility] dressing. I think that has been beneficial for him. With that being said at this time the patient is concerned about his biopsy that I sent off last week unfortunately I do not have that report as of yet. Nonetheless we have called to obtain this and hopefully will hear back from the lab later this morning. 03/15/19 on evaluation today patient's wound actually appears to be doing okay today with regard to the overall appearance of the wound bed. He has been tolerating the dressing changes without complication he still has hyper granular tissue noted but fortunately that seems to be minimal at this point compared  to some of what we've seen in the past. Nonetheless I do think that he is still having some issues currently with some of this type of granulation the biopsy and since all showed nothing more than just evidence of granulation tissue. Therefore there really is nothing different to initiate or do at this point. 03/24/19 on  evaluation today patient appears to be doing a little better with regard to his ulcer. He's been tolerating the dressing changes without complication. Fortunately there is no signs of active infection at this time. No fevers, chills, nausea, or vomiting noted at this time. I'm overall pleased with how things seem to be progressing. 03/29/2019 on evaluation today patient appears to be doing about the same in regard to his ulcer in the right gluteal fold. Unfortunately he is not seeming to make a lot of progress and the wound is somewhat stalled. There is no signs of active infection externally but I am concerned about the possibility of infection continuing in the ischial location which previously he did have infection noted. Again is not able to have an MRI so probably our best option for testing for this would be a triple phase bone scan which will detect subtle changes in the bone more so than plain film x-rays. In the past they really have not been beneficial and in fact the CT scans even have been somewhat questionable at times. Nonetheless there is no signs of systemic infection which is at least good news but again his wound is not healing at all on the predicted schedule. 04/05/19 on evaluation today patient appears to be doing well all things considering with regard to his wound and the right gluteal fold. He actually has his triple bone scan scheduled for sometime in the next couple of days. With that being said I've also been looking to other possibilities of what could be causing hyper granular tissue were looking into the bone scan again for evaluating for the risk or possibility of infection deeper and I'm also gonna go ahead and see about obtaining a deep tissue culture today to send and see if there's any evidence of infection noted on culture. He's in agreement with that plan. 04/12/2019 on evaluation today patient presents for reevaluation here in our clinic concerning ongoing issues with  his right gluteal fold ulcer. 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 It was discussed with him at that point that I think it would be appropriate SHERVIN, LAMERS. ([id number]) for him to proceed with hyperbaric oxygen therapy especially in light of the fact that he is previously been on IV antibiotics at the beginning of the year for close to 3 months followed by several weeks of oral antibiotics that was all prescribed by infectious disease. He had surgical debridement around Christmas December 2019 due to an abscess and osteomyelitis of the ischial bone. Unfortunately this has not really proceeded as well as we would have liked and again we did a CT scan even a couple months ago as he cannot have an MRI secondary to having issues with both a pain pump as well as a spinal cord stimulator which prevent him from going into an MRI machine. With that being said there were chronic changes noted at the ischial tuberosity which had progressed since December 2019 there was no evidence of fluid collection on that initial CT scan. With that being said  that was on January 21, 2019. I am not sure that it was a sensitive enough test as compared to an MRI and then subsequently I ordered a bone scan for him which was actually completed on 03/29/2019 and this revealed that he does indeed have positive osteomyelitis involving the right ischial tuberosity. This is adjacent to the ulcer and I think is the reason that his ulcer is not healing. Subsequently I am in a place him back on oral antibiotics today unfortunately his wound culture showed just mixed gram-negative flora with no specific findings of a predominant organism. Nonetheless being with the location I think a good broad-spectrum antibiotic for gram-negative's is a good choice at this point he is previously taken Levaquin without  significant issues and I think that is an appropriate antibiotic for him at this time. 04/19/2019 on evaluation today patient actually appears to be doing fairly well with regard to his wound. He has been tolerating the dressing changes without complication. Fortunately there is no signs of active infection and he has been taking the oral antibiotics at this time. Subsequently we did make a referral to infectious disease although Dr. Steva Ready wants the patient to be seen by general surgery first in order apparently to see if there is anything from a surgical standpoint that should be done prior to initiation of IV antibiotic therapy. Again the patient is okay with actually seeing Dr. Celine Ahr whom he has seen before we will make that referral. 04/26/2019 on evaluation today patient actually appears to be doing well with regard to his wound. He has been tolerating the dressing changes without complication. Fortunately there is no signs of active infection at this time. I do believe that the hyperbaric oxygen therapy along with the antibiotics which I prescribed at this point have been doing well for him. With that being said he has not seen Dr. Celine Ahr the surgeon yet in fact they have not been contacted for scheduling an appointment as of yet. Subsequently the patient is not on antibiotics currently by IV Dr. Steva Ready did want him to see Dr. Celine Ahr first which we are working on trying to get scheduled. We again give the information to the patient today for Dr. Celine Ahr and her number as far as contacting their office to see about getting something scheduled. Again were looking for whether or not they recommend any surgical intervention. 05/03/2019 on evaluation today patient appears to be doing about the same with regard to his wound there may be a little bit of filling in in regard to the base of the wound but still he has quite a bit of hyper granular tissue that I am seeing today. I believe that he  may may need a more aggressive sharp debridement possibly even by the surgeon or under anesthesia in order to clear away some of the hyper granular material in order to help allow for appropriate granulation to fill in. We have made a referral to Dr. Celine Ahr unfortunately it sounds as if they did not receive the referral we contacted them today they should be get in touch with the patient. Depending on how things show from that standpoint the patient may need to see Dr. Ardyth Gal who is the infectious disease specialist although he really does not want a PICC line again. No fevers, chills, nausea, vomiting, or diarrhea. He is tolerating hyperbaric oxygen therapy very well. 05/12/2019 on evaluation today patient presents for follow-up visit concerning his right gluteal fold ulcer. He did see Dr. Celine Ahr  the surgeon who previously evaluated his wound and she actually felt like he was doing quite well with regard to the wound based on what she was seen. He does seem to be responding to some degree with the oral antibiotics along with hyperbaric oxygen therapy at this point she did not see any evidence or need for further surgical intervention at this point. She recommended deferring additional or ongoing antibiotic therapy to Dr. Steva Ready at her discretion. Fortunately there is no signs of active systemic infection. No fevers, chills, nausea, vomiting, or diarrhea. 10/29; patient I am seeing really for the first time today in terms of his wound. 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 He is being treated with hyperbaric oxygen for underlying osteomyelitis most recently documented by a three-phase bone scan. He has been on Levaquin for roughly a month and he is out of these and asked me for a refill. Most recent cultures were negative although I do not think these were bone cultures. He has a very irregular surface to this wound which is almost nodular in texture. It  undermines superiorly with the same nodular hyper granulated surface. I see that he was referred to general surgery for an operative debridement although the surgeon did not agree with this I think that would have been the proper course of action in this. He has been using Hydrofera Blue. He is supposed to start a wound VAC tomorrow which is actually a reapplication of wound VAC. I think mostly last time they had trouble keeping the seal in place I hope we have better look at this this time. 05/24/2019 on evaluation today I did review patient's note from Dr. Dellia Nims where he was seen last week when I was out of the office. Subsequently it does appear that Dr. Dellia Nims was in agreement that the patient really needed debridement to clear away some of the nodular tissue in the base of the wound. The good news is he has the wound VAC initiated and some of this tissue is already starting to breakdown under the treatment of the wound VAC again because it is not really viable. [name], [name] ([id number]) Nonetheless I do believe that we can likely perform some debridement today to clear this away and hopefully the continuation of the wound VAC along with hyperbarics and antibiotic therapy will be beneficial in stopping this from reoccurring. If we get better control in the wound bed in a good spot I think we can definitely use the PriMatrix which we actually did get approval for. 05/31/2019 on evaluation today patient appears to be doing actually pretty well at this point with regard to his wound. He has been tolerating the dressing changes which is good news. Fortunately there is no signs of infection. He still has some of the hyper granular nodules noted we have not really cleared all these away and I think we can try to do a little bit more that today. I did do quite a bit last week I am hoping to get down to the base of the wound so though actually be able to heal more appropriately. I think the  wound VAC is doing a good job right now with controlling the drainage and overall the patient is happy with that. 06/07/2019 upon evaluation today patient appears to be doing quite well compared to last week with regard to the hyper granulation in the base of the wound. He has been tolerating the dressing changes without complication.  Fortunately there is no signs of active infection at this time. With that being said we have not had any recent measures as far as blood work is concerned I think we may want to check a few things including a CBC, sed rate, and C-reactive protein. Patient is in agreement with that we can try to get that scheduled for him for this Friday. 06/13/2019 on evaluation today patient actually appears to be doing much better at this point based on what I am seeing with regard to his wound. He has much less in the way of hyper granular tissue which is good news and a lot more healing that is taken place since I last saw him as far as the amount of space within the wound itself. With that being said he is nearing the completion of the initial 40 treatments for hyperbaric oxygen therapy I think this is something I would recommend extending as he does seem to be benefiting at this time. Fortunately there is no evidence of active systemic infection at this point nor even local infection for that matter. 06/20/2019 upon evaluation today patient actually appears to be doing well with regard to his wound. In fact this appears to be doing much better today compared to what it was previous. I been extremely pleased with the progress that he is made. In fact the tunnel is much less deep today than what it was in the past and I am seeing excellent signs of improvement. Overall I am happy with how things are going. I did review his white blood cell count which revealed everything to be okay. The one abnormal finding on his CBC was a hemoglobin of 12.7. Subsequently I did also review his sed  rate and C-reactive protein. His sed rate measured in at normal level measuring at 26. The C-reactive protein however was elevated today and an abnormal range indicating inflammation. Still I feel like he is trending in a good direction at this time. He is going to need a refill of the Levaquin he tells me at this point. 07/04/2019 on evaluation today patient actually appears to be doing well in regard to his wound. He has been tolerating the dressing changes without complication. Fortunately there is no signs of active infection which is good news. No fevers, chills, nausea, vomiting, or diarrhea. The good news also is that I did review his labs and though his sed rate was slightly elevated the C-reactive protein was actually down compared to the last evaluation 2 weeks ago this is excellent news I think it is showing signs of improvement. 07/11/2019 on evaluation today patient actually appears to be doing quite well with regard to his wound on my evaluation. This is very slow going but nonetheless he seems to be making progress. Especially in regard to the depth. Nonetheless I believe that we may want to consider going forward with the PriMatrix which was previously approved for him. We will get a have to get a new approval as we will actually be applying this in the new year so we will likely obtain that approval on January 4. In the meantime and for the time being my suggestion is going to be that we go ahead and continue with the wound VAC and we were going at this point with the current wound care measures. 12/28-Patient seen after HBO session today, wound depth with the tunnel measures 3.2 cm, the wound bed appears healthy, continuing with the VAC and HBO 07/25/2019 on evaluation today  patient's wound appears to be doing about the same compared to my last evaluation. Fortunately there is no evidence of significant infection which is good news we will still waiting on the results of his lab  work which unfortunately was not done prior to today's visit which is what we were aiming for. Ultimately there is no sign of active infection at this time which is good news. And he also tells me that the wound VAC has been staying in place and doing fairly well which is also good news. With that being said I am concerned about the fact that the wound has not progressed as much I think we may want to look into getting reapproval for the skin substitute, specifically PriMatrix, that I am hopeful will help to allow this area to granulate in more effectively. I would recommend as well continuing with the wound VAC along with this. Electronic Signature(s) BUDD, LUCKE ([id number]) Signed: 07/29/2019 10:21:05 AM By: Worthy Keeler [name] Previous Signature: 07/27/2019 8:06:26 AM Version By: Worthy Keeler [name] Entered By: Worthy Keeler on 07/29/2019 10:21:04 KALEI, LEONGUERRERO ([id number]) -------------------------------------------------------------------------------- Physical Exam Details Patient Name: [name] Date of Service: 07/25/2019 10:30 AM Medical Record Number: [id number] Patient Account Number: [id number] Date of Birth/Sex: 11/29/1950 (68 y.o. M) Treating RN: Harold Barban Primary Care Provider: [name] Other Clinician: Referring Provider: [name] Treating Provider/Extender: [name], Kien Mirsky Weeks in Treatment: 41 Constitutional Well-nourished and well-hydrated in no acute distress. Respiratory normal breathing without difficulty. Psychiatric this patient is able to make decisions and demonstrates good insight into disease process. Alert and Oriented x 3. pleasant and cooperative. Notes Patient's wound bed currently showed signs of minimal hyper granulation. In fact I did not feel like we really needed to perform any sharp debridement this week so I did not do any debridement at this time. With that being said I am going to recommend that we  continue with the wound VAC he will not have to undergo hyperbarics going forward at this point as I feel like the infection seems to be under good control as far as I am concerned. I am going to keep him on the antibiotics however we will continue to monitor his lab work as well. Electronic Signature(s) Signed: 07/27/2019 8:07:01 AM By: Worthy Keeler [name] Entered By: Worthy Keeler on 07/27/2019 08:07:00 KEERAN, PREVAL ([id number]) -------------------------------------------------------------------------------- Physician Orders Details Patient Name: [name] Date of Service: 07/25/2019 10:30 AM Medical Record Number: [id number] Patient Account Number: [id number] Date of Birth/Sex: 1950/11/05 (68 y.o. 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 - in office o Cleanse wound with mild soap and water Skin Barriers/Peri-Wound Care o Skin Prep Primary Wound Dressing Wound #1 Right Gluteal fold o Silver Collagen - under NPWT o Other: - in clinic - plain alginate, gauze and tape Dressing Change Frequency Wound #1 Right Gluteal fold o Change Dressing Monday, Wednesday, Friday Follow-up Appointments Wound #1 Right Gluteal fold o Return Appointment in 1 week. Off-Loading Wound #1 Right Gluteal fold o  Turn and reposition every 2 hours Home Health Wound #1 Right Gluteal fold o  [place] Visits - [place] Nurse may visit PRN to address patientos wound care needs. o FACE TO FACE ENCOUNTER: MEDICARE and MEDICAID PATIENTS: I certify that this patient is under my care and that I had a face-to-face encounter that meets the physician face-to-face encounter requirements with this patient on this date. The encounter with the patient was in whole or in part for the following [place] ([id number]) CONDITION: (primary reason for Home Healthcare) MEDICAL NECESSITY: I certify, that based on my findings, NURSING services are a medically necessary home health service. HOME BOUND STATUS: I certify that my clinical findings support that this patient is homebound (i.e., Due to illness or injury, pt requires aid of supportive devices such as crutches, cane, wheelchairs, walkers, the use of special transportation or the assistance of another person to leave their place of residence. There is a normal inability to leave the home and doing so requires considerable and taxing effort. Other absences are for medical reasons / religious services and are infrequent or of short duration when for other reasons). o If current dressing causes regression in wound condition, may D/C ordered dressing product/s and apply Normal Saline Moist Dressing daily until next [place] / Other MD appointment. [place] of regression in wound condition at [phone]. o Please direct any NON-WOUND related issues/requests for orders to patient's Primary Care Physician Negative Pressure Wound Therapy Wound #1 Right Gluteal fold o Wound VAC settings at 125/130 mmHg continuous pressure. Use BLACK/GREEN foam to wound cavity. Use WHITE foam to fill any tunnel/s and/or undermining. Change VAC dressing 3 X WEEK. Change canister as indicated when full. Nurse may titrate settings and frequency of dressing changes as clinically  indicated. o Home Health Nurse may d/c VAC for s/s of increased infection, significant wound regression, or uncontrolled drainage. [place] at [phone]. o Apply contact layer over base of wound. - apply prisma/silver collagen to the base of the wound o Number of foam/gauze pieces used in the dressing = Hyperbaric Oxygen Therapy Wound #1 Right Gluteal fold o Evaluate for HBO Therapy o Indication: - Right ischial osteomyelitis o If appropriate for treatment, begin HBOT per protocol: o 2.0 ATA for 90 Minutes without Air Breaks o One treatment per day (delivered Monday through Friday unless otherwise specified in Special Instructions below): o Total # of Treatments: - 60 Electronic Signature(s) Signed: 07/25/2019 12:55:09 PM By: Harold Barban Signed: 07/27/2019 6:44:22 PM By: Worthy Keeler [name] Entered By: Harold Barban on 07/25/2019 12:55:08 VARD, WAYNER ([id number]) -------------------------------------------------------------------------------- Problem List Details Patient Name: [name] Date of Service: 07/25/2019 10:30 AM Medical Record Number: [id number] Patient Account Number: [id number] Date of Birth/Sex: Feb 25, 1951 (69 y.o. 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, other site 04/08/2019 No Yes L03.317 Cellulitis of buttock 06/21/2018 No Yes G82.20 Paraplegia, unspecified 11/30/2017 No Yes S34.109S Unspecified injury to unspecified level of lumbar spinal cord, 11/30/2017 No Yes sequela I10 Essential (primary) hypertension 11/30/2017 No Yes Inactive Problems Resolved  Problems Electronic Signature(s) Signed: 07/25/2019 10:39:40 AM By: Worthy Keeler [name] Entered By: Worthy Keeler on 07/25/2019 10:39:39 Wierenga,  SHAUNE SEIDE ([id number]) -------------------------------------------------------------------------------- Progress Note Details Patient Name: LODEN, ALBER Date of Service: 07/25/2019 10:30 AM Medical Record Number: [id number] Patient Account Number: [id number] Date of Birth/Sex: 20-Apr-1951 (68 y.o. 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 He states that there is no history of diabetes that he is aware of. He does have issues with his prostate and is currently receiving treatment for this by way of oral medication. With that being said I do not have a lot of details in that regard. Nonetheless the patient presents today as a result of having been referred to [place] by another provider initially home health was set to come out and take care of his wound although due to the fact that he apparently drives he's not able to receive home health. His wife is therefore trying to help take care of this wound within although they have been struggling with what exactly to do at this point. She states that she can do some things but she is definitely not a nurse and does have some issues with looking at blood. The good news is the wound does not appear to be too deep and is fairly superficial at this point. There is no slough noted  there is some nonviable skin noted around the surface of the wound and the perimeter at this point. The central portion of the wound appears to be very good with a dermal layer noted this does not appear to be again deep enough to extend it to subcutaneous tissue at this point. 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 In the pump he has Dilaudid and baclofen. 12/07/17 on evaluation today patient presents for follow-up concerning his ongoing lower back thigh ulcer on the right. He states that he did not get the supplies ordered and therefore has not really been able to perform the dressing changes as directed exactly. His wife was able to get some Boarder Foam Dressing's from the drugstore and subsequently has been using hydrogel which did help to a degree in the wound does appear to be able smaller. There is actually more drainage this week noted than previous. 12/21/17 on evaluation today patient appears to be doing rather well in regard to his right gluteal ulcer. He has been tolerating the dressing changes without complication. There does not appear to be any evidence of infection at this point in time. Overall the wound does seem to be making some progress as far as the edges are concerned there's not as much in the way of overlapping of the external wound edges and he has a good epithelium to wound bed border for the most part. This however is not true right at the 12 o'clock location over the span of a little over a centimeters which actually will require debridement today to clean this away and hopefully allow it to continue to heal more appropriately. 12/28/17 on evaluation today patient appears to be doing rather well  in regard to his ulcer in the left gluteal region. He's been tolerating the dressing changes without complication. Apparently he has had some difficulty getting his dressing  material. Apparently there's been some confusion with ordering we're gonna check into this. Nonetheless overall he's been showing signs of improvement which is good news. Debridement is not required today. 01/04/18 on evaluation today patient presents for follow-up concerning his right gluteal ulcer. He has been tolerating the dressing changes fairly well. On inspection today it appears he may actually have some maceration them concerned about the fact that he may be developing too much moisture in and around the wound bed which can cause delay in healing. With that being said he unfortunately really has not showed significant signs of improvement since last week's evaluation in fact this may even be just the little bit/slightly larger. Nonetheless he's been having a lot of discomfort I'm not sure this is even related to the wound as he has no pain when I'm to breeding or otherwise cleaning the wound during evaluation today. Nonetheless this is something that we did recommend he talked to his pain specialist concerning. 01/11/18 on evaluation today patient appears to be doing better in regard to his ulceration. He has been tolerating the dressing Fishburn, Jakolby J. ([id number]) changes without complication. With that being said overall there's no evidence of infection which is good news. The only thing is he did receive the hatch affair blue classic versus the ready nonetheless I feel like this is perfectly fine and appears to have done well for him over the past week. 01/25/18 on evaluation today patient's wound actually appears to be a little bit larger than during the last evaluation. The good news is the majority of the wound edges actually appear to be fairly firmly attached to the wound bed unfortunately again we're not really making progress in regard to the size. Roughly the wound is about the same size as when I first saw him although again the wound margin/edges appear to be much  better. 02/01/18 on evaluation today patient actually appears to be doing very well in regard to his wound. Applying the Prisma dry does seem to be better although he does still have issues with slow progression of the wound. There was a slight improvement compared to last week's measurements today. Nonetheless I have been considering other options as far as the possibility of Theraskin or even a snap vac. In general I'm not sure that the Theraskin due to location of the wound would be a very good idea. Nonetheless I do think that a snap vac could be a possibility for the patient and in fact I think this could even be an excellent way to manage the wound possibly seeing some improvement in a very rapid fashion here. Nonetheless this is something that we would need to get approved and I did have a lengthy conversation with the patient about this today. 02/08/18 on evaluation today patient appears to be doing a little better in regard to his ulcer. He has been tolerating the dressing changes without complication. Fortunately despite the fact that the wound is a little bit smaller it's not significantly so unfortunately. We have discussed the possibility of a snap vac we did check with insurance this is actually covered at this point. Fortunately there does not appear to be any sign of infection. Overall I'm fairly pleased with how things seem to be appearing at this point. 02/15/18 on evaluation today patient appears to be doing  rather well in regard to his right gluteal ulcer. Unfortunately the snap vac did not stay in place with his sheer and friction this came loose and did not seem to maintain seal very well. He worked for about two days and it did seem to do very well during that time according to his wife but in general this does not seem to be something that's gonna be beneficial for him long-term. I do believe we need to go back to standard dressings to see if we can find something that will be  of benefit. 03/02/18- He is here in follow up evaluation; there is minimal change in the wound. He will continue with the same treatment plan, would consider changing to iodosrob/iodoflex if ulcer continues to to plateau. He will follow up next week 03/08/18 on evaluation today patient's wound actually appears to be about the same size as when I previously saw him several weeks back. Unfortunately he does have some slightly dark discoloration in the central portion of the wound which has me concerned about pressure injury. I do believe he may be sitting for too long a period of time in fact he tells me that "I probably sit for much too long". He does have some Slough noted on the surface of the wound and again as far as the size of the wound is concerned I'm really not seeing anything that seems to have improved significantly. 03/15/18 on evaluation today patient appears to be doing fairly well in regard to his ulcer. The wound measured pretty much about the same today compared to last week's evaluation when looking at his graph. With that being said the area of bruising/deep tissue injury that was noted last week I do not see at this point. He did get a new cushion fortunately this does seem to be have been of benefit in my pinion. It does appear that he's been off of this more which is good news as well I think that is definitely showing in the overall wound measurements. With that being said I do believe that he needs to continue to offload I don't think that the fact this is doing better should be or is going to allow him to not have to offload and explain this to him as well. Overall he seems to be in agreement the plan I think he understands. The overall appearance of the wound bed is improved compared to last week I think the Iodoflex has been beneficial in that regard. 03/29/18 on evaluation today patient actually appears to be doing rather well in regard to his wound from the overall  appearance standpoint he does have some granulation although there's some Slough on the surface of the wound noted as well. With that being said he unfortunately has not improved in regard to the overall measurement of the wound in volume or in size. I did have a discussion with him very specifically about offloading today. He actually does work although he mainly is just sitting throughout the day. He tells me he offloads by "lifting himself up for 30 seconds off of his chair occasionally" purchase from [employer] which does seem to have helped. And he has a new cushion that he with that being said he's also able to stand some for a very short period of time but not significant enough I think to provide appropriate offloading. I think the biggest issue at this point with the wound and the fact is not healing as quickly as we would  like is due to the fact that he is really not able to appropriately offload while at work. He states the beginning after his injury he actually had a bed at his job that he could lay on in order to offload and that does seem to have been of help back at that time. Nonetheless he had not done this in quite some time unfortunately. I think that could be helpful for him this is something I would like for him to look into. [name], [name] ([id number]) 04/05/18 on evaluation today patient actually presents for follow-up concerning his right gluteal ulcer. Again he really is not significantly improved even compared to last week. He has been tolerating the dressing changes without complication. With that being said fortunately there appears to be no evidence of infection at this time. He has been more proactive in trying to offload. 04/12/18 on evaluation today patient actually appears to be doing a little better in regard to his wound and the right gluteal fold region. He's been tolerating the dressing changes since removing the oasis without complication. However he  was having a lot of burning initially with the oasis in place. He's unsure of exactly why this was given so much discomfort but he assumes that it was the oasis itself causing the problem. Nonetheless this had to be removed after about three days in place although even those three days seem to have made a fairly good improvement in regard to the overall appearance of the wound bed. In fact is the first time that he's made any improvement from the standpoint of measurements in about six weeks. He continues to have no discomfort over the area of the wound itself which leads me to wonder why he was having the burning with the oasis when he does not even feel the actual debridement's themselves. I am somewhat perplexed by this. 04/19/18 on evaluation today patient's wound actually appears to be showing signs of epithelialization around the edge of the wound and in general actually appears to be doing better which is good news. He did have the same burning after about three days with applying the Endoform last week in the same fashion that I would generally apply a skin substitute. This seems to indicate that it's not the oasis to cause the problem but potentially the moisture buildup that just causes things to burn or there may be some other reaction with the skin prep or Steri-Strips. Nonetheless I'm not sure that is gonna be able to tolerate any skin substitute for a long period of time. The good news is the wound actually appears to be doing better today compared to last week and does seem to finally be making some progress. 04/26/18 on evaluation today patient actually appears to be doing rather well in regard to his ulcer in the right gluteal fold. He has been tolerating the dressing changes without complication which is good news. The Endoform does seem to be helping although he was a little bit more macerated this week. This seems to be an ongoing issue with fluid control at this point. Nonetheless  I think we may be able to add something like Drawtex to help control the drainage. 05/03/18 on evaluation today patient appears to actually be doing better in regard to the overall appearance of his wound. He has been tolerating the dressing changes without complication. Fortunately there appears to be no evidence of infection at this time. I really feel like his wound has shown signs as of  today of turning around last week I thought so as well and definitely he could be seen in this week's overall appearance and measurements. In general I'm very pleased with the fact that he finally seems to be making a steady but sure progress. The patient likewise is very pleased. 05/17/18 on evaluation today patient appears to be doing more poorly unfortunately in regard to his ulcer. He has been tolerating the dressing changes without complication. With that being said he tells me that in the past couple of days he and his wife have noticed that we did not seem to be doing quite as well is getting dark near the center. Subsequently upon evaluation today the wound actually does appear to be doing worse compared to previous. He has been tolerating the dressing changes otherwise and he states that he is not been sitting up anymore than he was in the past from what he tells me. Still he has continued to work he states "I'm tired of dealing with this and if I have to just go home and lay in the bed all the time that's what I'll do". Nonetheless I am concerned about the fact that this wound does appear to be deeper than what it was previous. 05/24/18 upon evaluation today patient actually presents after having been in the hospital due to what was presumed to be sepsis secondary to the wound infection. He had an elevated white blood cell count between 14 and 15. With that being said he does seem to be doing somewhat better now. His wound still is giving him some trouble nonetheless and he is obviously concerned about  the fact likely talked about that this does seem to go more deeply than previously noted. I did review his wound culture which showed evidence of Staphylococcus aureus him and group B strep. Nonetheless he is on antibiotics, Levaquin, for this. Subsequently I did review his intake summary from the hospital as well. I also did look at the CT of the lumbar spine with contrast that was performed which showed no bone destruction to suggest lumbar disguises/osteomyelitis or sacral osteomyelitis. There was no paraspinal abscess. Nonetheless it appears this may have been more of just a soft tissue infection at this point which is good news. He still is nonetheless concerned about the wound which again I think is completely reasonable considering everything he's been through recently. 05/31/18 on evaluation today on evaluation today patient actually appears to be showing signs of his wound be a little bit deeper than what I would like to see. Fortunately he does not show any signs of significant infection although his temperature was 99 today he states he's been checking this at home and has not been elevated. Nonetheless with the undermining that I'm seeing at this point I am becoming more concerned about the wound I do think that offloading is a key factor here that is preventing the speedy recovery at this point. There does not appear to be any evidence of again over infection noted. He's been using Santyl currently. [name], [name] ([id number]) 06/07/18 the patient presents today for follow-up evaluation regarding the left ulcer in the gluteal region. He has been tolerating the Wound VAC fairly well. He is obviously very frustrated with this he states that to mean is really getting in his way. There does not appear to be any evidence of infection at this time he does have a little bit of odor I do not necessarily associate this with infection just something that  we sometimes notice with Wound VAC  therapy. With that being said I can definitely catch a tone of discontentment overall in the patient's demeanor today. This when he was previously in the hospital an CT scan was done of the lumbar region which did not reveal any signs of osteomyelitis. With that being said the pelvis in particular was not evaluated distinctly which means he could still have some osteonecrosis I. Nonetheless the Wound VAC was started on Thursday I do want to get this little bit more time before jumping to a CT scan of the pelvis although that is something that I might would recommend if were not see an improvement by that time. 06/14/18 on evaluation today patient actually appears to be doing about the same in regard to his right gluteal ulcer. Again he did have a CT scan of the lumbar spine unfortunately this did not include the pelvis. Nonetheless with the depth of the wound that I'm seeing today even despite the fact that I'm not seeing any evidence of overt cellulitis I believe there's a good chance that we may be dealing with osteomyelitis somewhere in the right Ischial region. No fevers, chills, nausea, or vomiting noted at this time. 06/21/18 on evaluation today patient actually appears to be doing about the same with regard to his wound. The tunnel at 6 o'clock really does not appear to be any deeper although it is a little bit wider. I think at this point you may want to start packing this with white phone. Unfortunately I have not got approval for the CT scan of the pelvis as of yet due to the fact that Medicare apparently has been denied it due to the diagnosis codes not being appropriate according to Medicare for the test requested. With that being said the patient cannot have an MRI and therefore this is the only option that we have as far as testing is concerned. The patient has had infection and was on antibiotics and been added code for cellulitis of the bottom to see if this will be appropriate for  getting the test approved. Nonetheless I'm concerned about the infection have been spread deeper into the Ischial region. 06/28/18 on evaluation today patient actually appears to be doing rather well all things considered in regard to the right gluteal ulcer. He has been tolerating the dressing changes without complication. With that being said the Wound VAC he states does have to be replaced almost every day or at least reinforced unfortunately. Patient actually has his CT scan later this morning we should have the results by tomorrow. 07/05/18 on evaluation today patient presents for follow-up concerning his right Ischial ulcer. He did see the surgeon Dr. Lysle Pearl last week. They were actually very happy with him and felt like he spent a tremendous amount of time with them as far as discussing his situation was concerned. In the end Dr. Lysle Pearl did contact me as well and determine that he would not recommend any surgical intervention at this point as he felt like it would not be in the patient's best interest based on what he was seeing. He recommended a referral to infectious disease. Subsequently this is something that Dr. Ines Bloomer office is working on setting up for the patient. As far as evaluation today is concerned the patient's wound actually appears to be worse at this point. I am concerned about how things are progressing and specifically about infection. I do not feel like it's the deeper but the area of depth  is definitely widening which does have me concerned. No fevers, chills, nausea, or vomiting noted at this time. I think that we do need initiate antibiotic therapy the patient has an allow allergy to amoxicillin/penicillin he states that he gets a rash since childhood. Nonetheless she's never had the issues with Catholics or cephalosporins in general but he is aware of. 07/27/18 on evaluation today patient presents following admission to the hospital on 07/09/18. He was subsequently  discharged on 07/20/18. On 07/15/18 the patient underwent irrigation and debridement was soft tissue biopsy and bone biopsy as well as placement of a Wound VAC in the OR by Dr. Celine Ahr. During the hospital course the patient was placed on a Wound VAC and recommended follow up with surgery in three weeks actually with Dr. Delaine Lame who is infectious disease. The patient was on vancomycin during the hospital course. 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 He also had a bone culture which revealed evidence of methicillin-resistant staph aureus. He is updated CT scan 07/09/18 reveals that he had progression of the which was performed on wound to breakdown down to the trochanter where he actually had irregularities there as well suggestive of osteomyelitis. This was a change just since 9 December when we last performed a CT scan. Obviously this one had gone downhill quite significantly and rapidly. At this point upon evaluation I feel like in general the patient's wound seems to be doing fairly well all things considered upon my evaluation today. Obviously this is larger and deeper than what I previously evaluated but at the same time he seems to be making some progress as far as the appearance of the granulation tissue is concerned. I'm happy in that regard. No fevers, chills, nausea, or vomiting noted at this time. He is on IV vancomycin and Rocephin at the facility. He is currently in [facility]. 08/03/18 upon evaluation today patient's wound appears to be doing better in regard to the overall appearance at this point in time. Fortunately he's been tolerating the Wound VAC without complication and states that the facility has been taking NIJAL, BENTLER. ([id number]) excellent care of the wound site. Overall I see some Slough noted on the surface which I am going to attempt sharp debridement today of but nonetheless other than this I feel like he's  making progress. 08/09/18 on evaluation today patient's wound appears to be doing much better compared to even last week's evaluation. Do believe that the Wound VAC is been of great benefit for him. He has been tolerating the dressing changes that is the Wound VAC without any complication and he has excellent granulation noted currently. There is no need for sharp debridement at this point. 08/16/18 on evaluation today patient actually appears to be doing very well in regard to the wound in the right gluteal fold region. This is showing signs of progress and again appears to be very healthy which is excellent news. Fortunately there is no sign of active infection by way of odor or drainage at this point. Overall I'm very pleased with how things stand. He seems to be tolerating the Wound VAC without complication. 08/23/18 on evaluation today patient actually appears to be doing better in regard to his wound. He has been tolerating the Wound VAC without complication and in fact it has been collecting a significant amount of drainage which I think is good news especially considering how the wound appears. Fortunately there is no signs of infection at this  time definitely nothing appears to be worse which is good news. He has not been started on the Bactrim and Flagyl that was recommended by Dr. Delaine Lame yet. I did actually contact her office this morning in order to check and see were things are that regard their gonna be calling me back. 08/30/18 on evaluation today patient actually appears to show signs of excellent improvement today compared to last evaluation. The undermining is getting much better the wound seems to be feeling quite nicely and I'm very pleased that the granulation in general. With that being said overall I feel like the patient has made excellent progress which is great news. No fevers, chills, nausea, or vomiting noted at this time. 09/06/18 on evaluation today patient actually  appears to be doing rather well in regard to his right gluteal ulcer. This is showing signs of improvement in overall I'm very pleased with how things seem to be progressing. The patient likewise is please. Overall I see no evidence of infection he is about to complete his oral antibiotic regimen which is the end of the antibiotics for him in just about three days. 09/13/18 on evaluation today patient's right Ischial ulcer appears to be showing signs of continued improvement which is excellent news. He's been tolerating the dressing changes without complication. Fortunately there's no signs of infection and the wound that seems to be doing very well. 09/28/18 on evaluation today patient appears to be doing rather well in regard to his right Ischial ulcer. He's been tolerating the Wound VAC without complication he knows there's much less drainage than there used to be this obviously is not a bad thing in my pinion. There's no evidence of infection despite the fact is but nothing about it now for several weeks. 10/04/18 on evaluation today patient appears to be doing better in regard to his right Ischial wound. He has been tolerating the Wound VAC without complication and I do believe that the silver nitrate last week was beneficial for him. Fortunately overall there's no evidence of active infection at this time which is great news. No fevers, chills, nausea, or vomiting noted at this time. 10/11/18 on evaluation today patient actually appears to be doing rather well in regard to his Ischial ulcer. He's been tolerating the Wound VAC still without complication I feel like this is doing a good job. No fevers, chills, nausea, or vomiting noted at this time. 11/01/18 on evaluation today patient presents after having not been seen in our clinic for several weeks secondary to the fact that he was on evaluation today patient presents after having not been seen in our clinic for several weeks secondary to  the fact that he was in a skilled nursing facility which was on lockdown currently due to the covert 19 national emergency. Subsequently he was discharged from the facility on this past Friday and subsequently made an appointment to come in to see yesterday. Fortunately there's no signs of active infection at this time which is good news and overall he does seem to have made progress since I last saw. Overall I feel like things are progressing quite nicely. The patient is having no pain. 11/08/18 on evaluation today patient appears to be doing okay in regard to his right gluteal ulcer. He has been utilizing a Wound VAC home health this changing this at this point since he's home from the skilled nursing facility. Fortunately there's no signs of obvious active infection at this time. Unfortunately though there's no obvious active  infection he is having some maceration and his wife states that when the sheets of the Wound VAC office on Sunday when it broke seal that he ended up having significant issues with some smell as well there concerned about the possibility of infection. Fortunately there's No fevers, chills, nausea, or vomiting noted at this time. [name], WIESS ([id number]) 11/15/18 on evaluation today patient actually appears to be doing well in regard to his right gluteal ulcer. He has been tolerating the dressing changes without complication. Specifically the Wound VAC has been utilized up to this point. Fortunately there's no signs of infection and overall I feel like he has made progress even since last week when I last saw him. I'm actually fairly happy with the overall appearance although he does seem to have somewhat of a hyper granular overgrowth in the central portion of the wound which I think may require some sharp debridement to try flatness out possibly utilizing chemical cauterization following. 11/23/18 on evaluation today patient actually appears to be doing very well in  regard to his sacral ulcer. He seems to be showing signs of improvement with good granulation. With that being said he still has the small area of hyper granulation right in the central portion of the wound which I'm gonna likely utilize silver nitrate on today. Subsequently he also keeps having a leak at the 6 o'clock location which is unfortunate we may be able to help out with some suggestions to try to prevent this going forward. Fortunately there's no signs of active infection at this time. 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 He's been tolerating the dressing changes without complication. Fortunately there's no signs of active infection at this time. I've been rather pleased with how things have progressed there still some evidence of pressure getting to the area with some redness right around the immediate wound opening. Nonetheless other than this I'm not seeing any significant complications or issues the wound is somewhat hyper granular. Upon discussing with the patient and his wife today I'm not sure that the wound is being packed to the base with the foam at this point. And if it's not been packed fully that may be part of the reason why is not seen as much improvement as far as the granulation from the base out. Again we do not want pack too tightly but we need some of the firm to get to the base of the wound. I discussed this with patient and his wife today. 12/06/18 on evaluation today patient appears to be doing well in regard to his right gluteal pressure ulcer. He's been tolerating the dressing changes without complication. Fortunately there's no signs of active infection. He still has some hyper granular tissue and I do think it would be appropriate to continue with the chemical cauterization as of today. 12/16/18 on evaluation today patient actually appears to be doing okay in  regard to his right gluteal ulcer. He is been tolerating the dressing changes without complication including the Wound VAC. Overall I feel like nothing seems to be worsening I do feel like that the hyper granulation buds in the central portion of the wound have improved to some degree with the silver nitrate. We will have to see how things continue to progress. 12/20/18 on evaluation today patient actually appears to be doing much worse in my pinion even compared to last week's evaluation. Unfortunately  as opposed to showing any signs of improvement the areas of hyper granular tissue in the central portion of the wound seem to be getting worse. Subsequently the wound bed itself also seems to be getting deeper even compared to last week which is both unusual as well as concerning since prior he had been shown signs of improvement. Nonetheless I think that the issue could be that he's actually having some difficulty in issues with a deeper infection. There's no external signs of infection but nonetheless I am more worried about the internal, osteomyelitis, that could be restarting. He has not been on antibiotics for some time at this point. I think that it may be a good idea to go ahead and started back on an antibiotic therapy while we wait to see what the testing shows. 12/27/18 on evaluation today patient presents for follow-up concerning his left gluteal fold wound. Fortunately he appears to be doing well today. I did review the CT scan which was negative for any signs of osteomyelitis or acute abnormality this is excellent news. Overall I feel like the surface of the wound bed appears to be doing significantly better today compared to previously noted findings. There does not appear any signs of infection nor does he have any pain at this time. 01/03/19 on evaluation today patient actually appears to be doing quite well in regard to his ulcer. Post debridement last week he really did not have too much  bleeding which is good news. Fortunately today this seems to be doing some better but we still has some of the hyper granular tissue noted in the base of the wound which is gonna require sharp debridement today as well. Overall I'm pleased with how things seem to be progressing since we switched away from the Wound VAC I think he is making some progress. 01/10/19 on evaluation today patient appears to be doing better in regard to his right gluteal fold ulcer. He has been tolerating the dressing changes without complication. The debridement to seem to be helping with current away some of the poor hyper granular tissue bugs throughout the region of his gluteal fold wound. He's been tolerating the dressing changes otherwise without complication which is great news. No fevers, chills, nausea, or vomiting noted at this time. 01/17/19 on evaluation today patient actually appears to be doing excellent in regard to his wound. He's been tolerating the dressing changes without complication. Fortunately there is no signs of active infection at this time which is great news. No fevers, chills, nausea, or vomiting noted at this time. IZAIS, LITTLEJOHN ([id number]) 01/24/19 on evaluation today patient actually appears to be doing quite well with regard to his ulcer. He has been tolerating the dressing changes without complication. Fortunately there's no signs of active infection at this time. Overall been very pleased with the progress that he seems to be making currently. 01/31/19-Patient returns at 1 week with apparent similarity in dimensions to the wound, with no signs of infection, he has been changing dressings twice a day 02/08/19 upon evaluation today patient actually appears to be doing well with regard to his right Ischial ulcer. The wound is not appear to be quite as deep and seems to be making progress which is good news. With that being said I'm still reluctant to go back to the Wound VAC at this  point. He's been having to change the dressings twice a day which is a little bit much in my pinion from the wound care supplies  standpoint. I think that possibly attempting to utilize extras orbit may be beneficial this may also help to prevent any additional breakdown secondary to fluid retention in the wound itself. The patient is in agreement with giving this a try. 02/15/19 on evaluation today patient actually appears to be doing decently well with regard to his ulcer in the right to gluteal fold location. He's been tolerating the dressing changes without complication. Fortunately there is no signs of active infection at this time. He is able to keep the current dressing in place more effectively for a day at a time whereas before he was having a changes to to three times a day. The actions or has been helpful in this regard. Fortunately there's no signs of anything getting worse and I do feel like he showing signs of good improvement with regard to the wound bed status. 02/22/2019 patient appears to be doing very well today with regard to his ulcer in the gluteal fold. Fortunately there is no signs of active infection and he has been tolerating the dressing changes without any complication. Overall extremely pleased with how things seem to be progressing. He has much less of the hyper granular projections within the wound these have slowly been debrided away and he seems to be doing well. The wound bed is more uniform. 03/01/19 on evaluation today patient appears to be doing unfortunately about the same in regard to his gluteal ulcer. He's been tolerating the dressing changes without complication. Fortunately there's no signs of active infection at this time. With that being said he continues to develop these hyper granular projections which I'm unsure of exactly what they are and why they are rising. Nonetheless I explained to the patient that I do believe it would be a good idea for [place] to stand a  biopsy sample for pathology to see if that can shed any light on what exactly may be going on here. Fortunately I do not see any obvious signs of infection. With that being said the patient has had a little bit more drainage this week apparently compared to last week. 03/08/2019 on evaluation today patient actually appears to look somewhat better with regard to the appearance of his wound bed at this time. This is good news. Overall I am very pleased with how things seem to have progressed just in the past week with a switch to the [facility] dressing. I think that has been beneficial for him. With that being said at this time the patient is concerned about his biopsy that I sent off last week unfortunately I do not have that report as of yet. Nonetheless we have called to obtain this and hopefully will hear back from the lab later this morning. 03/15/19 on evaluation today patient's wound actually appears to be doing okay today with regard to the overall appearance of the wound bed. He has been tolerating the dressing changes without complication he still has hyper granular tissue noted but fortunately that seems to be minimal at this point compared to some of what we've seen in the past. Nonetheless I do think that he is still having some issues currently with some of this type of granulation the biopsy and since all showed nothing more than just evidence of granulation tissue. Therefore there really is nothing different to initiate or do at this point. 03/24/19 on evaluation today patient appears to be doing a little better with regard to his ulcer. He's been tolerating the dressing changes without complication. Fortunately  there is no signs of active infection at this time. No fevers, chills, nausea, or vomiting noted at this time. I'm overall pleased with how things seem to be progressing. 03/29/2019 on evaluation today patient appears to be doing about the same in regard to his ulcer in the right  gluteal fold. Unfortunately he is not seeming to make a lot of progress and the wound is somewhat stalled. There is no signs of active infection externally but I am concerned about the possibility of infection continuing in the ischial location which previously he did have infection noted. Again is not able to have an MRI so probably our best option for testing for this would be a triple phase bone scan which will detect subtle changes in the bone more so than plain film x-rays. In the past they really have not been beneficial and in fact the CT scans even have been somewhat questionable at times. Nonetheless there is no signs of systemic infection which is at least good news but again his wound is not healing at all on the predicted schedule. 04/05/19 on evaluation today patient appears to be doing well all things considering with regard to his wound and the right gluteal fold. He actually has his triple bone scan scheduled for sometime in the next couple of days. With that being said I've also been looking to other possibilities of what could be causing hyper granular tissue were looking into the bone scan again for evaluating for the risk or possibility of infection deeper and I'm also gonna go ahead and see about obtaining a deep tissue Seay, Burnett J. ([id number]) culture today to send and see if there's any evidence of infection noted on culture. He's in agreement with that plan. 04/12/2019 on evaluation today patient presents for reevaluation here in our clinic concerning ongoing issues with his right gluteal fold ulcer. 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 It was discussed with him at that point that I think it would be appropriate for him to proceed with hyperbaric oxygen therapy especially in light of the fact that he is previously been on IV antibiotics at the  beginning of the year for close to 3 months followed by several weeks of oral antibiotics that was all prescribed by infectious disease. He had surgical debridement around Christmas December 2019 due to an abscess and osteomyelitis of the ischial bone. Unfortunately this has not really proceeded as well as we would have liked and again we did a CT scan even a couple months ago as he cannot have an MRI secondary to having issues with both a pain pump as well as a spinal cord stimulator which prevent him from going into an MRI machine. With that being said there were chronic changes noted at the ischial tuberosity which had progressed since December 2019 there was no evidence of fluid collection on that initial CT scan. With that being said that was on January 21, 2019. I am not sure that it was a sensitive enough test as compared to an MRI and then subsequently I ordered a bone scan for him which was actually completed on 03/29/2019 and this revealed that he does indeed have positive osteomyelitis involving the right ischial tuberosity. This is adjacent to the ulcer and I think is the reason that his ulcer is not healing. Subsequently I am in a place him back on oral  antibiotics today unfortunately his wound culture showed just mixed gram-negative flora with no specific findings of a predominant organism. Nonetheless being with the location I think a good broad-spectrum antibiotic for gram-negative's is a good choice at this point he is previously taken Levaquin without significant issues and I think that is an appropriate antibiotic for him at this time. 04/19/2019 on evaluation today patient actually appears to be doing fairly well with regard to his wound. He has been tolerating the dressing changes without complication. Fortunately there is no signs of active infection and he has been taking the oral antibiotics at this time. Subsequently we did make a referral to infectious disease although Dr.  Steva Ready wants the patient to be seen by general surgery first in order apparently to see if there is anything from a surgical standpoint that should be done prior to initiation of IV antibiotic therapy. Again the patient is okay with actually seeing Dr. Celine Ahr whom he has seen before we will make that referral. 04/26/2019 on evaluation today patient actually appears to be doing well with regard to his wound. He has been tolerating the dressing changes without complication. Fortunately there is no signs of active infection at this time. I do believe that the hyperbaric oxygen therapy along with the antibiotics which I prescribed at this point have been doing well for him. With that being said he has not seen Dr. Celine Ahr the surgeon yet in fact they have not been contacted for scheduling an appointment as of yet. Subsequently the patient is not on antibiotics currently by IV Dr. Steva Ready did want him to see Dr. Celine Ahr first which we are working on trying to get scheduled. We again give the information to the patient today for Dr. Celine Ahr and her number as far as contacting their office to see about getting something scheduled. Again were looking for whether or not they recommend any surgical intervention. 05/03/2019 on evaluation today patient appears to be doing about the same with regard to his wound there may be a little bit of filling in in regard to the base of the wound but still he has quite a bit of hyper granular tissue that I am seeing today. I believe that he may may need a more aggressive sharp debridement possibly even by the surgeon or under anesthesia in order to clear away some of the hyper granular material in order to help allow for appropriate granulation to fill in. We have made a referral to Dr. Celine Ahr unfortunately it sounds as if they did not receive the referral we contacted them today they should be get in touch with the patient. Depending on how things show from that  standpoint the patient may need to see Dr. Ardyth Gal who is the infectious disease specialist although he really does not want a PICC line again. No fevers, chills, nausea, vomiting, or diarrhea. He is tolerating hyperbaric oxygen therapy very well. 05/12/2019 on evaluation today patient presents for follow-up visit concerning his right gluteal fold ulcer. He did see Dr. Celine Ahr the surgeon who previously evaluated his wound and she actually felt like he was doing quite well with regard to the wound based on what she was seen. He does seem to be responding to some degree with the oral antibiotics along with hyperbaric oxygen therapy at this point she did not see any evidence or need for further surgical intervention at this point. She recommended deferring additional or ongoing antibiotic therapy to Dr. Steva Ready at her discretion. Fortunately there is  no signs of active systemic infection. No fevers, chills, nausea, vomiting, or diarrhea. 10/29; patient I am seeing really for the first time today in terms of his wound. 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 He is being treated with hyperbaric oxygen for underlying osteomyelitis most recently documented by a three-phase bone scan. He has been on Levaquin for roughly a month and he is out of these and asked me for a refill. Most recent cultures were negative although I do not think these were bone cultures. He has a very irregular surface to this wound which is almost nodular in texture. It undermines superiorly with the same nodular hyper granulated surface. I see that he was referred to general surgery for an operative debridement although the surgeon did not agree with this I think that would have been the proper course of action in this. He has been using Hydrofera Blue. He is supposed to start a wound VAC tomorrow which is actually a reapplication of wound VAC. I think mostly last time they had trouble keeping the  seal in place I hope we have better look at this this time. DEVONTAE, WIERMAN ([id number]) 05/24/2019 on evaluation today I did review patient's note from Dr. Dellia Nims where he was seen last week when I was out of the office. Subsequently it does appear that Dr. Dellia Nims was in agreement that the patient really needed debridement to clear away some of the nodular tissue in the base of the wound. The good news is he has the wound VAC initiated and some of this tissue is already starting to breakdown under the treatment of the wound VAC again because it is not really viable. Nonetheless I do believe that we can likely perform some debridement today to clear this away and hopefully the continuation of the wound VAC along with hyperbarics and antibiotic therapy will be beneficial in stopping this from reoccurring. If we get better control in the wound bed in a good spot I think we can definitely use the PriMatrix which we actually did get approval for. 05/31/2019 on evaluation today patient appears to be doing actually pretty well at this point with regard to his wound. He has been tolerating the dressing changes which is good news. Fortunately there is no signs of infection. He still has some of the hyper granular nodules noted we have not really cleared all these away and I think we can try to do a little bit more that today. I did do quite a bit last week I am hoping to get down to the base of the wound so though actually be able to heal more appropriately. I think the wound VAC is doing a good job right now with controlling the drainage and overall the patient is happy with that. 06/07/2019 upon evaluation today patient appears to be doing quite well compared to last week with regard to the hyper granulation in the base of the wound. He has been tolerating the dressing changes without complication. Fortunately there is no signs of active infection at this time. With that being said we have not had  any recent measures as far as blood work is concerned I think we may want to check a few things including a CBC, sed rate, and C-reactive protein. Patient is in agreement with that we can try to get that scheduled for him for this Friday. 06/13/2019 on evaluation today patient actually appears to be doing much better at this point based  on what I am seeing with regard to his wound. He has much less in the way of hyper granular tissue which is good news and a lot more healing that is taken place since I last saw him as far as the amount of space within the wound itself. With that being said he is nearing the completion of the initial 40 treatments for hyperbaric oxygen therapy I think this is something I would recommend extending as he does seem to be benefiting at this time. Fortunately there is no evidence of active systemic infection at this point nor even local infection for that matter. 06/20/2019 upon evaluation today patient actually appears to be doing well with regard to his wound. In fact this appears to be doing much better today compared to what it was previous. I been extremely pleased with the progress that he is made. In fact the tunnel is much less deep today than what it was in the past and I am seeing excellent signs of improvement. Overall I am happy with how things are going. I did review his white blood cell count which revealed everything to be okay. The one abnormal finding on his CBC was a hemoglobin of 12.7. Subsequently I did also review his sed rate and C-reactive protein. His sed rate measured in at normal level measuring at 26. The C-reactive protein however was elevated today and an abnormal range indicating inflammation. Still I feel like he is trending in a good direction at this time. He is going to need a refill of the Levaquin he tells me at this point. 07/04/2019 on evaluation today patient actually appears to be doing well in regard to his wound. He has been  tolerating the dressing changes without complication. Fortunately there is no signs of active infection which is good news. No fevers, chills, nausea, vomiting, or diarrhea. The good news also is that I did review his labs and though his sed rate was slightly elevated the C-reactive protein was actually down compared to the last evaluation 2 weeks ago this is excellent news I think it is showing signs of improvement. 07/11/2019 on evaluation today patient actually appears to be doing quite well with regard to his wound on my evaluation. This is very slow going but nonetheless he seems to be making progress. Especially in regard to the depth. Nonetheless I believe that we may want to consider going forward with the PriMatrix which was previously approved for him. We will get a have to get a new approval as we will actually be applying this in the new year so we will likely obtain that approval on January 4. In the meantime and for the time being my suggestion is going to be that we go ahead and continue with the wound VAC and we were going at this point with the current wound care measures. 12/28-Patient seen after HBO session today, wound depth with the tunnel measures 3.2 cm, the wound bed appears healthy, continuing with the VAC and HBO 07/25/2019 on evaluation today patient's wound appears to be doing about the same compared to my last evaluation. Fortunately there is no evidence of significant infection which is good news we will still waiting on the results of his lab work which unfortunately was not done prior to today's visit which is what we were aiming for. Ultimately there is no sign of active infection at this time which is good news. And he also tells me that the wound VAC has been staying in  place and doing fairly well which is also good news. With that being said I am concerned about the fact that the wound has not progressed as much METTHEW, MINARD. ([id number]) I think we may want  to look into getting reapproval for the skin substitute, specifically PriMatrix, that I am hopeful will help to allow this area to granulate in more effectively. I would recommend as well continuing with the wound VAC along with this. Patient History Information obtained from Patient. Family History Hypertension - Father, Stroke - Mother, No family history of Cancer, Diabetes, Heart Disease, Kidney Disease, Lung Disease, Seizures, Thyroid Problems, Tuberculosis. Social History Never smoker, Marital Status - Married, Alcohol Use - Never, Drug Use - No History, Caffeine Use - Daily. Medical History Eyes Patient has history of Cataracts - both removed Denies history of Glaucoma, Optic Neuritis Ear/Nose/Mouth/Throat Denies history of Chronic sinus problems/congestion, Middle ear problems Hematologic/Lymphatic Denies history of Anemia, Hemophilia, Human Immunodeficiency Virus, Lymphedema Respiratory Denies history of Aspiration, Asthma, Chronic Obstructive Pulmonary Disease (COPD), Pneumothorax, Sleep Apnea, Tuberculosis Cardiovascular Patient has history of Hypertension - takes medication Denies history of Angina, Arrhythmia, Congestive Heart Failure, Coronary Artery Disease, Deep Vein Thrombosis, Hypotension, Myocardial Infarction, Peripheral Arterial Disease, Peripheral Venous Disease, Phlebitis, Vasculitis Gastrointestinal Denies history of Cirrhosis , Colitis, Crohn s, Hepatitis A, Hepatitis B, Hepatitis C Endocrine Denies history of Type I Diabetes, Type II Diabetes Genitourinary Denies history of End Stage Renal Disease Immunological Denies history of Lupus Erythematosus, Raynaud s, Scleroderma Integumentary (Skin) Denies history of History of Burn, History of pressure wounds Musculoskeletal Denies history of Gout, Rheumatoid Arthritis, Osteoarthritis, Osteomyelitis Neurologic Patient has history of Paraplegia - waist down Denies history of Dementia, Neuropathy, Quadriplegia,  Seizure Disorder Oncologic Denies history of Received Chemotherapy, Received Radiation Psychiatric Denies history of Anorexia/bulimia, Confinement Anxiety Medical And Surgical History Notes Oncologic Prostate cancer- currently treated with horomone therapy Review of Systems (ROS) Constitutional Symptoms (General Health) Denies complaints or symptoms of Fatigue, Fever, Chills, Marked Weight Change. Respiratory Denies complaints or symptoms of Chronic or frequent coughs, Shortness of Breath. Cardiovascular [name], [name] ([id number]) Denies complaints or symptoms of Chest pain, LE edema. Psychiatric Denies complaints or symptoms of Anxiety, Claustrophobia. Objective Constitutional Well-nourished and well-hydrated in no acute distress. Respiratory normal breathing without difficulty. Psychiatric this patient is able to make decisions and demonstrates good insight into disease process. Alert and Oriented x 3. pleasant and cooperative. General Notes: Patient's wound bed currently showed signs of minimal hyper granulation. In fact I did not feel like we really needed to perform any sharp debridement this week so I did not do any debridement at this time. With that being said I am going to recommend that we continue with the wound VAC he will not have to undergo hyperbarics going forward at this point as I feel like the infection seems to be under good control as far as I am concerned. I am going to keep him on the antibiotics however we will continue to monitor his lab work as well. Integumentary (Hair, Skin) Wound #1 status is Open. Original cause of wound was Pressure Injury. The wound is located on the Right Gluteal fold. The wound measures 2.5cm length x 1.5cm width x 2.8cm depth; 2.945cm^2 area and 8.247cm^3 volume. There is muscle and Fat Layer (Subcutaneous Tissue) Exposed exposed. There is tunneling at 12:00 with a maximum distance of 3.8cm. There is a large amount of serous  drainage noted. The wound margin is epibole. There is large (67-100%) pink,  hyper - granulation within the wound bed. There is a small (1-33%) amount of necrotic tissue within the wound bed including Adherent Slough. General Notes: maceration around edges of wound. 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 ([id number]) Plan Wound Cleansing: Wound #1 Right Gluteal fold: Clean wound with Normal Saline. - in office Cleanse wound with mild soap and water Skin Barriers/Peri-Wound Care: Skin Prep Primary Wound Dressing: Wound #1 Right Gluteal fold: Silver Collagen - under NPWT Other: - in clinic - plain alginate, gauze and tape Dressing Change Frequency: Wound #1 Right Gluteal fold: Change Dressing Monday, Wednesday, Friday Follow-up Appointments: Wound #1 Right Gluteal fold: Return Appointment in 1 week. Off-Loading: Wound #1 Right Gluteal fold: Turn and reposition every 2 hours Home Health: Wound #1 Right Gluteal fold: Continue Home Health Visits - [place] Nurse may visit PRN to address patient s wound care needs. FACE TO FACE ENCOUNTER: MEDICARE and MEDICAID PATIENTS: I certify that this patient is under my care and that I had a face-to-face encounter that meets the physician face-to-face encounter requirements with this patient on this date. The encounter with the patient was in whole or in part for the following MEDICAL CONDITION: (primary reason for [place]) MEDICAL NECESSITY: I certify, that based on my findings, NURSING services are a medically necessary home health service. HOME BOUND STATUS: I certify that my clinical findings support that this patient is homebound (i.e., Due to illness or injury, pt requires  aid of supportive devices such as crutches, cane, wheelchairs, walkers, the use of special transportation or the assistance of another person to leave their place of residence. There is a normal inability to leave the home and doing so requires considerable and taxing effort. Other absences are for medical reasons / religious services and are infrequent or of short duration when for other reasons). If current dressing causes regression in wound condition, may D/C ordered dressing product/s and apply Normal Saline Moist Dressing daily until next [place] / Other MD appointment. [place] of regression in wound condition at [phone]. Please direct any NON-WOUND related issues/requests for orders to patient's Primary Care Physician Negative Pressure Wound Therapy: Wound #1 Right Gluteal fold: Wound VAC settings at 125/130 mmHg continuous pressure. Use BLACK/GREEN foam to wound cavity. Use WHITE foam to fill any tunnel/s and/or undermining. Change VAC dressing 3 X WEEK. Change canister as indicated when full. Nurse may titrate settings and frequency of dressing changes as clinically indicated. Home Health Nurse may d/c VAC for s/s of increased infection, significant wound regression, or uncontrolled drainage. [place] at [phone]. Apply contact layer over base of wound. - apply prisma/silver collagen to the base of the wound Number of foam/gauze pieces used in the dressing = Hyperbaric Oxygen Therapy: Wound #1 Right Gluteal fold: Evaluate for HBO Therapy Indication: - Right ischial osteomyelitis If appropriate for treatment, begin HBOT per protocol: 2.0 ATA for 90 Minutes without Air Breaks One treatment per day (delivered Monday through Friday unless otherwise specified in Special Instructions below): Total # of Treatments: - 60 Haan, Samyak J. ([id number]) 1. My suggestion at this time is can be that we go ahead and continue with  the wound VAC. I am going to have him discontinue hyperbaric oxygen therapy at this point however. We will be getting  lab work done in the next couple of days and then likely I will repeat this in a couple weeks as well just to see where we are at. 2. I am going to see about getting approval for [employer] has a skin substitute to see if we can allow this to help the area to granulate and faster and more effectively. 3. I am also going to suggest that we continue with appropriate offloading the patient needs to be as much as possible offloading the area not sitting for extended periods of time. This has been discussed multiple times during the numerous times have seen him for visits here in the clinic. We will see patient back for reevaluation in 1 week here in the clinic. If anything worsens or changes patient will contact our office for additional recommendations. Electronic Signature(s) Signed: 07/29/2019 10:22:01 AM By: Worthy Keeler [name] Previous Signature: 07/27/2019 8:08:01 AM Version By: Worthy Keeler [name] Entered By: Worthy Keeler on 07/29/2019 10:22:00 MICHAELDAVID, SYLTE ([id number]) -------------------------------------------------------------------------------- ROS/PFSH Details Patient Name: [name] Date of Service: 07/25/2019 10:30 AM Medical Record Number: [id number] Patient Account Number: [id number] Date of Birth/Sex: Aug 26, 1950 (68 y.o. M) Treating RN: Harold Barban Primary Care Provider: [name] Other Clinician: Referring Provider: [name] Treating Provider/Extender: [name], Ryatt Corsino Weeks in Treatment: 29 Information Obtained From Patient Constitutional Symptoms (General Health) Complaints and Symptoms: Negative for: Fatigue; Fever; Chills; Marked Weight Change Respiratory Complaints and Symptoms: Negative for: Chronic or frequent coughs; Shortness of Breath Medical History: Negative for: Aspiration; Asthma; Chronic Obstructive  Pulmonary Disease (COPD); Pneumothorax; Sleep Apnea; Tuberculosis Cardiovascular Complaints and Symptoms: Negative for: Chest pain; LE edema Medical History: Positive for: Hypertension - takes medication Negative for: Angina; Arrhythmia; Congestive Heart Failure; Coronary Artery Disease; Deep Vein Thrombosis; Hypotension; Myocardial Infarction; Peripheral Arterial Disease; Peripheral Venous Disease; Phlebitis; Vasculitis Psychiatric Complaints and Symptoms: Negative for: Anxiety; Claustrophobia Medical History: Negative for: Anorexia/bulimia; Confinement Anxiety Eyes Medical History: Positive for: Cataracts - both removed Negative for: Glaucoma; Optic Neuritis Ear/Nose/Mouth/Throat Medical History: Negative for: Chronic sinus problems/congestion; Middle ear problems Hematologic/Lymphatic Medical History: Negative for: Anemia; Hemophilia; Human Immunodeficiency Virus; Lymphedema SUNDIATA, CUCCHI. ([id number]) Gastrointestinal Medical History: Negative for: Cirrhosis ; Colitis; Crohnos; Hepatitis A; Hepatitis B; Hepatitis C Endocrine Medical History: Negative for: Type I Diabetes; Type II Diabetes Genitourinary Medical History: Negative for: End Stage Renal Disease Immunological Medical History: Negative for: Lupus Erythematosus; Raynaudos; Scleroderma Integumentary (Skin) Medical History: Negative for: History of Burn; History of pressure wounds Musculoskeletal Medical History: Negative for: Gout; Rheumatoid Arthritis; Osteoarthritis; Osteomyelitis Neurologic Medical History: Positive for: Paraplegia - waist down Negative for: Dementia; Neuropathy; Quadriplegia; Seizure Disorder Oncologic Medical History: Negative for: Received Chemotherapy; Received Radiation Past Medical History Notes: Prostate cancer- currently treated with horomone therapy HBO Extended History Items Eyes: Cataracts Immunizations Pneumococcal Vaccine: Received Pneumococcal Vaccination:  No Implantable Devices No devices added Family and Social History Cancer: No; Diabetes: No; Heart Disease: No; Hypertension: Yes - Father; Kidney Disease: No; Lung Disease: No; Seizures: No; Stroke: Yes - Mother; Thyroid Problems: No; Tuberculosis: No; Never smoker; Marital Status - Married; Alcohol Use: Never; Drug Use: No History; Caffeine Use: Daily TAYVIN, MEDENDORP ([id number]) Physician Affirmation I have reviewed and agree with the above information. Electronic Signature(s) Signed: 07/27/2019 3:59:27 PM By: Harold Barban Signed: 07/27/2019 6:44:22 PM By: Worthy Keeler [name] Entered By: Worthy Keeler on 07/27/2019 08:06:44 USMAN, DUNMORE ([id number]) -------------------------------------------------------------------------------- SuperBill Details Patient Name: [name] Date of Service: 07/25/2019 Medical  Record Number: [id number] Patient Account Number: [id number] Date of Birth/Sex: 1951/02/20 (69 y.o. 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

## 2019-07-25 NOTE — Progress Notes (Addendum)
TAURIS, FAHRENKRUG ([id number]) Visit Report for 07/25/2019 HBO Details Patient Name: [name], [name] Date of Service: 07/25/2019 8:00 AM Medical Record Number: [id number] Patient Account Number: [id number] Date of Birth/Sex: July 08, 1951 (68 y.o. 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 HBO Treatment 07/25/2019 Tuberosity End Date: HBO Discharge Treatment Series Complete; Infection Outcome: Controlled or Resolved HBO Treatment Details Treatment Number: 60 Patient Type: Outpatient Chamber Type: Monoplace Chamber Serial #: [id number] Treatment Protocol: 2.0 ATA with 90 minutes oxygen, and no air breaks Treatment Details Compression Rate Down: 1.5 psi / minute De-Compression Rate Up: 1.5 psi / minute Compress Tx Pressure Air breaks and breathing periods Decompress Decompress Begins Reached (leave unused spaces blank) Begins Ends Chamber Pressure (ATA) 1 2 - - - - - - 2 1 Clock Time (24 hr) 08:21 08:32 - - - - - - 10:03 10:13 Treatment Length: 112 (minutes) Treatment Segments: 4 Vital Signs Capillary Blood Glucose Reference Range: 80 - 120 mg / dl HBO Diabetic Blood Glucose Intervention Range: <131 mg/dl or >249 mg/dl Time Vitals Blood Respiratory Capillary Blood Glucose Pulse Action Type: Pulse: Temperature: Taken: Pressure: Rate: Glucose (mg/dl): Meter #: Oximetry (%) Taken: Pre 08:16 108/70 68 16 98.7 Post 10:20 102/62 68 16 97.8 Treatment Response Treatment Completion Status: Treatment Completed without Adverse Event HBO Attestation I certify that I supervised this HBO treatment in accordance with  Medicare guidelines. A trained emergency response Yes team is readily available per hospital policies and procedures. Continue HBOT as ordered. Yes Electronic Signature(s) NEEDHAM, MACGOWAN ([id number]) Signed: 07/27/2019 7:09:04 AM By: Worthy Keeler [name] Previous Signature: 07/25/2019 11:50:31 AM Version By: Gretta Cool, BSN, RN, CWS, Kim RN, BSN Previous Signature: 07/25/2019 10:29:04 AM Version By: Gretta Cool, BSN, RN, CWS, Kim RN, BSN Previous Signature: 07/25/2019 10:19:27 AM Version By: Gretta Cool, BSN, RN, CWS, Kim RN, BSN Previous Signature: 07/25/2019 10:04:10 AM Version By: Gretta Cool, BSN, RN, CWS, Kim RN, BSN Previous Signature: 07/25/2019 8:32:55 AM Version By: Gretta Cool, BSN, RN, CWS, Kim RN, BSN Previous Signature: 07/25/2019 8:27:13 AM Version By: Gretta Cool BSN, RN, CWS, Kim RN, BSN Previous Signature: 07/25/2019 8:21:27 AM Version By: Gretta Cool, BSN, RN, CWS, Kim RN, BSN Entered By: Worthy Keeler on 07/27/2019 07:09:03 JEMIL, STGERMAIN ([id number]) -------------------------------------------------------------------------------- HBO Safety Checklist Details Patient Name: [name] Date of Service: 07/25/2019 8:00 AM Medical Record Number: [id number] Patient Account Number: [id number] Date of Birth/Sex: 1951/07/20 (68 y.o. M) Treating RN: [name] Primary Care Georgette Helmer: [name] Other Clinician: Referring Erubiel Manasco: [name] Treating Jakyah Bradby/Extender: [name], HOYT Weeks in Treatment: 86 HBO Safety Checklist Items Safety Checklist Consent Form Signed Patient voided / foley secured and emptied When did you last eato am Last dose of injectable or oral agent NA Ostomy pouch emptied and vented if applicable NA All implantable devices assessed, documented and approved NA Intravenous access site secured and place NA Valuables secured Linens and cotton and cotton/polyester blend (less than 51% polyester) Personal oil-based products / skin lotions / body lotions removed Wigs or  hairpieces removed NA Smoking or tobacco materials removed NA Books / newspapers / magazines / loose paper removed Cologne, aftershave, perfume and deodorant removed Jewelry removed (may wrap wedding band) NA Make-up removed NA Hair care products  removed Battery operated devices (external) removed NA Heating patches and chemical warmers removed NA Titanium eyewear removed NA Nail polish cured greater than 10 hours NA Casting material cured greater than 10 hours NA Hearing aids removed NA Loose dentures or partials removed NA Prosthetics have been removed NA Patient demonstrates correct use of air break device (if applicable) Patient concerns have been addressed Patient grounding bracelet on and cord attached to chamber Specifics for Inpatients (complete in addition to above) Medication sheet sent with patient Intravenous medications needed or due during therapy sent with patient Drainage tubes (e.g. nasogastric tube or chest tube secured and vented) Endotracheal or Tracheotomy tube secured Cuff deflated of air and inflated with saline Airway suctioned Electronic Signature(s) Signed: 07/25/2019 10:03:27 AM By: Gretta Cool, BSN, RN, CWS, Kim RN, BSN Previous Signature: 07/25/2019 8:18:10 AM Version By: Gretta Cool, BSN, RN, CWS, Kim RN, BSN [address] [place] ([id number]) Entered By: Gretta Cool, BSN, RN, CWS, Kim on 07/25/2019 10:03:26

## 2019-07-25 NOTE — Progress Notes (Addendum)
ERIBERTO, SEDENO ([id number]) Visit Report for 07/25/2019 Arrival Information Details Patient Name: [name], [name] Date of Service: 07/25/2019 8:00 AM Medical Record Number: [id number] Patient Account Number: [id number] Date of Birth/Sex: [birth year] (69 y.o. M) Treating RN: [name] Primary Care Kairi Harshbarger: [name] Other Clinician: Referring Agata Lucente: [name] Treating Avia Merkley/Extender: [name], HOYT Weeks in Treatment: 27 Visit Information History Since Last Visit Added or deleted any medications: No Patient Arrived: Wheel Chair Any new allergies or adverse reactions: No Arrival Time: 08:05 Had a fall or experienced change in No Accompanied By: wife activities of daily living that may affect Transfer Assistance: Harrel Lemon Lift risk of falls: Patient Identification Verified: Yes Signs or symptoms of abuse/neglect since last visito No Secondary Verification Process Completed: Yes Hospitalized since last visit: No Patient Requires Transmission-Based No Implantable device outside of the clinic excluding No Precautions: cellular tissue based products placed in the center Patient Has Alerts: Yes since last visit: Patient Alerts: NOT Pain Present Now: No Diabetic Electronic Signature(s) Signed: 07/25/2019 8:16:25 AM By: Gretta Cool, BSN, RN, CWS, Kim RN, BSN Entered By: Gretta Cool, BSN, RN, CWS, Kim on 07/25/2019 08:16:25 [name], [name] ([id number]) -------------------------------------------------------------------------------- Encounter Discharge Information Details Patient Name: [name] Date of Service: 07/25/2019 8:00 AM Medical Record Number: [id number] Patient Account Number: [id number] Date of Birth/Sex: 29-Jun-1951 (69 y.o. M) Treating RN: [name] Primary Care Alexzavier Girardin: [name] Other Clinician: Referring Lindley Stachnik: [name] Treating Ibrahem Volkman/Extender: [name], HOYT Weeks in Treatment: 55 Encounter Discharge Information  Items Discharge Condition: Stable Ambulatory Status: Wheelchair Discharge Destination: Home Transportation: Private Auto Accompanied By: wife Schedule Follow-up Appointment: Yes Clinical Summary of Care: Electronic Signature(s) Signed: 07/25/2019 11:51:08 AM By: Gretta Cool, BSN, RN, CWS, Kim RN, BSN Entered By: Gretta Cool, BSN, RN, CWS, Kim on 07/25/2019 11:51:08 [name], [name] ([id number]) -------------------------------------------------------------------------------- Vitals Details Patient Name: [name] Date of Service: 07/25/2019 8:00 AM Medical Record Number: [id number] Patient Account Number: [id number] Date of Birth/Sex: 1951-07-20 (69 y.o. M) Treating RN: [name] Primary Care Tyshell Ramberg: [name] Other Clinician: Referring Yerachmiel Spinney: [name] Treating Ivet Guerrieri/Extender: [name], HOYT Weeks in Treatment: 42 Vital Signs Time Taken: 08:16 Temperature (F): 98.7 Height (in): 73 Pulse (bpm): 68 Weight (lbs): 210 Respiratory Rate (breaths/min): 16 Body Mass Index (BMI): 27.7 Blood Pressure (mmHg): 108/70 Reference Range: 80 - 120 mg / dl Electronic Signature(s) Signed: 07/25/2019 8:16:58 AM By: Gretta Cool, BSN, RN, CWS, Kim RN, BSN Previous Signature: 07/25/2019 8:16:52 AM Version By: Gretta Cool, BSN, RN, CWS, Kim RN, BSN Entered By: Gretta Cool, BSN, RN, CWS, Kim on 07/25/2019 08:16:57

## 2019-07-27 NOTE — Progress Notes (Signed)
[name], [name] ([id number]) Visit Report for 07/25/2019 Problem List Details Patient Name: [name], [name] Date of Service: 07/25/2019 8:00 AM Medical Record Number: [id number] Patient Account Number: [id number] Date of Birth/Sex: April 26, 1951 (68 y.o. 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

## 2019-08-01 ENCOUNTER — Encounter: Payer: Medicare Other | Admitting: Physician Assistant

## 2019-08-01 ENCOUNTER — Other Ambulatory Visit: Payer: Self-pay

## 2019-08-01 DIAGNOSIS — L89314 Pressure ulcer of right buttock, stage 4: Secondary | ICD-10-CM | POA: Diagnosis not present

## 2019-08-01 NOTE — Progress Notes (Signed)
KODEE, DESARRO ([id number]) Visit Report for 08/01/2019 Arrival Information Details Patient Name: [name], [name] Date of Service: 08/01/2019 8:15 AM Medical Record Number: [id number] Patient Account Number: [id number] Date of Birth/Sex: 07/04/1951 (69 y.o. M) Treating RN: [name] Primary Care Fischer Halley: [name] Other Clinician: Referring Ranyah Groeneveld: [name] Treating Jaxtyn Linville/Extender: [name], HOYT Weeks in Treatment: 67 Visit Information History Since Last Visit Added or deleted any medications: No Patient Arrived: Wheel Chair Any new allergies or adverse reactions: No Arrival Time: 08:13 Had a fall or experienced change in No Accompanied By: wife activities of daily living that may affect Transfer Assistance: None risk of falls: Patient Identification Verified: Yes Signs or symptoms of abuse/neglect since last visito No Secondary Verification Process Completed: Yes Hospitalized since last visit: No Patient Requires Transmission-Based No Implantable device outside of the clinic excluding No Precautions: cellular tissue based products placed in the center Patient Has Alerts: Yes since last visit: Patient Alerts: NOT Has Dressing in Place as Prescribed: Yes Diabetic Pain Present Now: Yes Electronic Signature(s) Signed: 08/01/2019 11:06:38 AM By: Lorine Bears RCP, RRT, CHT Entered By: Lorine Bears on 08/01/2019 08:14:27 [name] ([id number]) -------------------------------------------------------------------------------- Encounter Discharge Information Details Patient Name: [name] Date of Service: 08/01/2019 8:15 AM Medical Record Number: [id number] Patient Account Number: [id number] Date of Birth/Sex: [birth year] (68 y.o. M) Treating RN: [name] Primary Care Delberta Folts: [name] Other Clinician: Referring Kele Withem: [name] Treating Mizael Sagar/Extender: [name],  HOYT Weeks in Treatment: 44 Encounter Discharge Information Items Post Procedure Vitals Discharge Condition: Stable Temperature (F): 98.7 Ambulatory Status: Wheelchair Pulse (bpm): 82 Discharge Destination: Home Respiratory Rate (breaths/min): 16 Transportation: Private Auto Blood Pressure (mmHg): 133/79 Accompanied By: self Schedule Follow-up Appointment: Yes Clinical Summary of Care: Electronic Signature(s) Signed: 08/01/2019 5:00:56 PM By: Gretta Cool, BSN, RN, CWS, Kim RN, BSN Entered By: Gretta Cool, BSN, RN, CWS, Kim on 08/01/2019 08:44:01 QUILLIE, BLEAKLEY ([id number]) -------------------------------------------------------------------------------- Lower Extremity Assessment Details Patient Name: [name] Date of Service: 08/01/2019 8:15 AM Medical Record Number: [id number] Patient Account Number: [id number] Date of Birth/Sex: [birth year] (68 y.o. M) Treating RN: [name] Primary Care Jinger Middlesworth: [name] Other Clinician: Referring Oluwakemi Salsberry: [name] Treating Troi Florendo/Extender: Worthy Keeler Weeks in Treatment: 30 Electronic Signature(s) Signed: 08/01/2019 5:00:56 PM By: Gretta Cool, BSN, RN, CWS, Kim RN, BSN Entered By: Gretta Cool, BSN, RN, CWS, Kim on 08/01/2019 08:24:56 EMERALD, CUNNANE ([id number]) -------------------------------------------------------------------------------- Multi Wound Chart Details Patient Name: [name] Date of Service: 08/01/2019 8:15 AM Medical Record Number: [id number] Patient Account Number: [id number] Date of Birth/Sex: 02-11-1951 (68 y.o. M) Treating RN: [name] Primary Care Graycee Greeson: [name] Other Clinician: Referring Raynie Steinhaus: [name] Treating Brielle Moro/Extender: [name], HOYT Weeks in Treatment: 43 Vital Signs Height(in): 73 Pulse(bpm): 82 Weight(lbs): 210 Blood Pressure(mmHg): 133/79 Body Mass Index(BMI): 28 Temperature(F): 98.7 Respiratory Rate 16 (breaths/min): Photos:  [N/A:N/A] Wound Location: Right Gluteal fold N/A N/A Wounding Event: Pressure Injury N/A N/A Primary Etiology: Pressure Ulcer N/A N/A Comorbid History: Cataracts, Hypertension, N/A N/A Paraplegia Date Acquired: 11/02/2017 N/A N/A Weeks of Treatment: 87 N/A N/A Wound Status: Open N/A N/A Measurements L x W x D 2.5x1.2x2.9 N/A N/A (cm) Area (cm) : 2.356 N/A N/A Volume (cm) : 6.833 N/A N/A % Reduction in Area: 76.60% N/A N/A % Reduction in Volume: -579.90% N/A N/A Position 1 (o'clock): 12 Maximum Distance 1 (cm): 4 Tunneling: Yes N/A N/A Classification: Category/Stage IV N/A N/A Exudate Amount: Large N/A N/A Exudate Type: Serous N/A N/A Exudate Color:  amber N/A N/A Wound Margin: Epibole N/A N/A Granulation Amount: Large (67-100%) N/A N/A Granulation Quality: Pink, Hyper-granulation N/A N/A Necrotic Amount: Small (1-33%) N/A N/A Exposed Structures: Fat Layer (Subcutaneous N/A N/A Tissue) Exposed: Yes Muscle: Yes Fascia: No Kewley, Jaris J. ([id number]) Tendon: No Joint: No Bone: No Epithelialization: Small (1-33%) N/A N/A Assessment Notes: Maceration around edges. N/A N/A Treatment Notes Electronic Signature(s) Signed: 08/01/2019 5:00:56 PM By: Gretta Cool, BSN, RN, CWS, Kim RN, BSN Entered By: Gretta Cool, BSN, RN, CWS, Kim on 08/01/2019 08:25:10 RUVIM, ZAVATSKY ([id number]) -------------------------------------------------------------------------------- [place] Details Patient Name: [name] Date of Service: 08/01/2019 8:15 AM Medical Record Number: [id number] Patient Account Number: [id number] Date of Birth/Sex: 1951-03-01 (68 y.o. M) Treating RN: [name] Primary Care Quinton Voth: [name] Other Clinician: Referring Tynetta Bachmann: [name] Treating Merced Brougham/Extender: [name], HOYT Weeks in Treatment: 87 Active Inactive Orientation to the Wound Care Program Nursing Diagnoses: Knowledge deficit related to the wound healing  center program Goals: Patient/caregiver will verbalize understanding of the [place] Program Date Initiated: 11/30/2017 Target Resolution Date: 12/21/2017 Goal Status: Active Interventions: Provide education on orientation to the wound center Notes: Pressure Nursing Diagnoses: Knowledge deficit related to management of pressures ulcers Goals: Patient/caregiver will verbalize understanding of pressure ulcer management Date Initiated: 05/17/2018 Target Resolution Date: 05/28/2018 Goal Status: Active Interventions: Assess: immobility, friction, shearing, incontinence upon admission and as needed Notes: Wound/Skin Impairment Nursing Diagnoses: Impaired tissue integrity Goals: Patient/caregiver will verbalize understanding of skin care regimen Date Initiated: 11/30/2017 Target Resolution Date: 12/21/2017 Goal Status: Active Ulcer/skin breakdown will have a volume reduction of 30% by week 4 Date Initiated: 11/30/2017 Target Resolution Date: 12/21/2017 Goal Status: Active JARVIS, YOCHUM ([id number]) Interventions: Assess patient/caregiver ability to obtain necessary supplies Assess patient/caregiver ability to perform ulcer/skin care regimen upon admission and as needed Assess ulceration(s) every visit Treatment Activities: Skin care regimen initiated : 11/30/2017 Notes: Electronic Signature(s) Signed: 08/01/2019 5:00:56 PM By: Gretta Cool, BSN, RN, CWS, Kim RN, BSN Entered By: Gretta Cool, BSN, RN, CWS, Kim on 08/01/2019 08:25:03 KENDRIC, SEWARD ([id number]) -------------------------------------------------------------------------------- Pain Assessment Details Patient Name: [name] Date of Service: 08/01/2019 8:15 AM Medical Record Number: [id number] Patient Account Number: [id number] Date of Birth/Sex: 1950-11-09 (68 y.o. M) Treating RN: [name] Primary Care Rital Cavey: [name] Other Clinician: Referring Dorella Laster: [name] Treating  Alizia Greif/Extender: [name], HOYT Weeks in Treatment: 36 Active Problems Location of Pain Severity and Description of Pain Patient Has Paino Yes Site Locations Rate the pain. Current Pain Level: 5 Pain Management and Medication Current Pain Management: Electronic Signature(s) Signed: 08/01/2019 11:06:38 AM By: Lorine Bears RCP, RRT, CHT Signed: 08/01/2019 5:00:56 PM By: Gretta Cool, BSN, RN, CWS, Kim RN, BSN Entered By: Lorine Bears on 08/01/2019 08:14:37 JERRALD, TING ([id number]) -------------------------------------------------------------------------------- Patient/Caregiver Education Details Patient Name: [name] Date of Service: 08/01/2019 8:15 AM Medical Record Number: [id number] Patient Account Number: [id number] Date of Birth/Gender: [birth year] (68 y.o. M) Treating RN: [name] Primary Care Physician: [name] Other Clinician: Referring Physician: [name] Treating Physician/Extender: Sharalyn Ink in Treatment: 44 Education Assessment Education Provided To: Patient Education Topics Provided Wound Debridement: Handouts: Wound Debridement Methods: Demonstration, Explain/Verbal Responses: State content correctly Wound/Skin Impairment: Handouts: Caring for Your Ulcer Methods: Demonstration, Explain/Verbal Responses: State content correctly Electronic Signature(s) Signed: 08/01/2019 5:00:56 PM By: Gretta Cool, BSN, RN, CWS, Kim RN, BSN Entered By: Gretta Cool, BSN, RN, CWS, Kim on 08/01/2019 08:42:55 CLEASTER, DOMENICK ([id number]) -------------------------------------------------------------------------------- Wound Assessment Details Patient Name: Merrill,  [name] Date of Service: 08/01/2019 8:15 AM Medical Record Number: [id number] Patient Account Number: [id number] Date of Birth/Sex: 1950/11/17 (68 y.o. M) Treating RN: [name] Primary Care Allysha Tryon: [name] Other Clinician: Referring  Tavius Turgeon: [name] Treating Abriella Filkins/Extender: [name], HOYT Weeks in Treatment: 87 Wound Status Wound Number: 1 Primary Etiology: Pressure Ulcer Wound Location: Right Gluteal fold Wound Status: Open Wounding Event: Pressure Injury Comorbid History: Cataracts, Hypertension, Paraplegia Date Acquired: 11/02/2017 Weeks Of Treatment: 87 Clustered Wound: No Photos Wound Measurements Length: (cm) 2.5 % Reductio Width: (cm) 1.2 % Reductio Depth: (cm) 2.9 Epithelial Area: (cm) 2.356 Tunneling Volume: (cm) 6.833 Positi Maximum n in Area: 76.6% n in Volume: -579.9% ization: Small (1-33%) : Yes on (o'clock): 12 Distance: (cm) 4 Undermining: No Wound Description Classification: Category/Stage IV Foul Odor Wound Margin: Epibole Slough/Fib Exudate Amount: Large Exudate Type: Serous Exudate Color: amber After Cleansing: No rino Yes Wound Bed Granulation Amount: Large (67-100%) Exposed Structure Granulation Quality: Pink, Hyper-granulation Fascia Exposed: No Necrotic Amount: Small (1-33%) Fat Layer (Subcutaneous Tissue) Exposed: Yes Necrotic Quality: Adherent Slough Tendon Exposed: No Muscle Exposed: Yes Necrosis of Muscle: No Joint Exposed: No Funches, Colbin J. ([id number]) Bone Exposed: No Assessment Notes Maceration around edges. Treatment Notes Wound #1 (Right Gluteal fold) Notes zinc to peri wound, aquacell, abd and tape Electronic Signature(s) Signed: 08/01/2019 5:00:56 PM By: Gretta Cool, BSN, RN, CWS, Kim RN, BSN Entered By: Gretta Cool, BSN, RN, CWS, Kim on 08/01/2019 08:24:00 TALMAGE, HORRY ([id number]) -------------------------------------------------------------------------------- Vitals Details Patient Name: [name] Date of Service: 08/01/2019 8:15 AM Medical Record Number: [id number] Patient Account Number: [id number] Date of Birth/Sex: 05/18/1951 (68 y.o. M) Treating RN: [name] Primary Care Chadwick Reiswig: [name] Other  Clinician: Referring Selisa Tensley: [name] Treating Haelie Clapp/Extender: [name], HOYT Weeks in Treatment: 20 Vital Signs Time Taken: 08:14 Temperature (F): 98.7 Height (in): 73 Pulse (bpm): 82 Weight (lbs): 210 Respiratory Rate (breaths/min): 16 Body Mass Index (BMI): 27.7 Blood Pressure (mmHg): 133/79 Reference Range: 80 - 120 mg / dl Electronic Signature(s) Signed: 08/01/2019 11:06:38 AM By: Lorine Bears RCP, RRT, CHT Entered By: Becky Sax, Amado Nash on 08/01/2019 08:16:42

## 2019-08-01 NOTE — Progress Notes (Signed)
BARUCH, BEHRMAN ([id number]) Visit Report for 08/01/2019 Chief Complaint Document Details Patient Name: [name], STOLTZFUS Date of Service: 08/01/2019 8:15 AM Medical Record Number: [id number] Patient Account Number: [id number] Date of Birth/Sex: 01/06/1951 (69 y.o. M) Treating RN: [name] Primary Care Provider: [name] Other Clinician: Referring Provider: [name] Treating Provider/Extender: [name], Jeb Schloemer Weeks in Treatment: 90 Information Obtained from: Patient Chief Complaint Right gluteal fold ulcer Electronic Signature(s) Signed: 08/01/2019 4:22:40 PM By: Worthy Keeler [name] Entered By: Worthy Keeler on 08/01/2019 08:16:42 AVIEN, BENARD ([id number]) -------------------------------------------------------------------------------- Debridement Details Patient Name: [name] Date of Service: 08/01/2019 8:15 AM Medical Record Number: [id number] Patient Account Number: [id number] Date of Birth/Sex: [birth year] (68 y.o. M) Treating RN: [name] Primary Care Provider: [name] Other Clinician: Referring Provider: [name] Treating Provider/Extender: [name], Rimsha Trembley Weeks in Treatment: 72 Debridement Performed for Wound #1 Right Gluteal fold Assessment: Performed By: Physician [name], Adelise Buswell E., [name] Debridement Type: Debridement Level of Consciousness (Pre- Awake and Alert procedure): Pre-procedure Verification/Time Yes - 08:38 Out Taken: Start Time: 08:38 Pain Control: Lidocaine Total Area Debrided (L x W): 2 (cm) x 2 (cm) = 4 (cm) Tissue and other material Viable, Non-Viable, Subcutaneous, Hyper-granulation debrided: Level: Skin/Subcutaneous Tissue Debridement Description: Excisional Instrument: Curette Bleeding: Large Hemostasis Achieved: Silver Nitrate End Time: 08:40 Response to Treatment: Procedure was tolerated well Level of Consciousness Awake and Alert (Post-procedure): Post Debridement  Measurements of Total Wound Length: (cm) 2.5 Stage: Category/Stage IV Width: (cm) 1.2 Depth: (cm) 3.1 Volume: (cm) 7.304 Character of Wound/Ulcer Post Stable Debridement: Post Procedure Diagnosis Same as Pre-procedure Electronic Signature(s) Signed: 08/01/2019 4:22:40 PM By: Worthy Keeler [name] Signed: 08/01/2019 5:00:56 PM By: Gretta Cool, BSN, RN, CWS, Kim RN, BSN Entered By: Gretta Cool, BSN, RN, CWS, Kim on 08/01/2019 08:42:23 SARVESH, BURDGE ([id number]) -------------------------------------------------------------------------------- HPI Details Patient Name: [name] Date of Service: 08/01/2019 8:15 AM Medical Record Number: [id number] Patient Account Number: [id number] Date of Birth/Sex: 1950-07-27 (69 y.o. 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 He states that there is no history of diabetes that he is aware of. He does have issues with his prostate and is currently receiving treatment for this by way of oral medication. With that being said I do not have a lot of details in that regard. Nonetheless the patient presents today as a result of having been referred to [place] by another provider initially home health was set to come out and take care of his wound although due to the fact that he apparently drives he's not able to receive home health. His wife is therefore trying to help take care of this wound within although they have been struggling with what exactly to do at this point. She states that she can do some things but she is  definitely not a nurse and does have some issues with looking at blood. The good news is the wound does not appear to be too deep and is fairly superficial at this point. There is no slough noted there is some nonviable skin noted around the surface of the  wound and the perimeter at this point. The central portion of the wound appears to be very good with a dermal layer noted this does not appear to be again deep enough to extend it to subcutaneous tissue at this point. 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 In the pump he has Dilaudid and baclofen. 12/07/17 on evaluation today patient presents for follow-up concerning his ongoing lower back thigh ulcer on the right. He states that he did not get the supplies ordered and therefore has not really been able to perform the dressing changes as directed exactly. His wife was able to get some Boarder Foam Dressing's from the drugstore and subsequently has been using hydrogel which did help to a degree in the wound does appear to be able smaller. There is actually more drainage this week noted than previous. 12/21/17 on evaluation today patient appears to be doing rather well in regard to his right gluteal ulcer. He has been tolerating the dressing changes without complication. There does not appear to be any evidence of infection at this point in time. Overall the wound does seem to be making some progress as far as the edges are concerned there's not as much in the way of overlapping of the external wound edges and he has a good epithelium to wound bed border for the most part. This however is not true right at the 12 o'clock location over the span of a little over a centimeters which actually will require debridement today to clean this away and hopefully allow it to continue to heal more appropriately. 12/28/17 on evaluation today patient appears  to be doing rather well in regard to his ulcer in the left gluteal region. He's been tolerating the dressing changes without complication. Apparently he has had some difficulty getting his dressing material. Apparently there's been some confusion with ordering we're gonna check into this. Nonetheless overall he's been showing signs of improvement which is good news. Debridement is not required today. 01/04/18 on evaluation today patient presents for follow-up concerning his right gluteal ulcer. He has been tolerating the dressing changes fairly well. On inspection today it appears he may actually have some maceration them concerned about the fact that he may be developing too much moisture in and around the wound bed which can cause delay in healing. With that being said he unfortunately really has not showed significant signs of improvement since last week's evaluation in fact this may even be just the little bit/slightly larger. Nonetheless he's been having a lot of discomfort I'm not sure this is even related to the wound as he has no pain when I'm to breeding or otherwise cleaning the wound during evaluation today. Nonetheless this is something that we did recommend he talked to his pain specialist concerning. 01/11/18 on evaluation today patient appears to be doing better in regard to his ulceration. He has been tolerating the dressing changes without complication. With that being said overall there's no evidence of infection which is good news. The only thing is he did receive the hatch affair blue classic versus the ready nonetheless I feel like this is perfectly fine and appears to have done well for him over the past week. 01/25/18 on evaluation today patient's wound actually appears to be a little bit larger than during the last evaluation. The good KARSYN, STILTZ. ([id number]) news is the majority of the wound  edges actually appear to be fairly firmly attached to the wound bed  unfortunately again we're not really making progress in regard to the size. Roughly the wound is about the same size as when I first saw him although again the wound margin/edges appear to be much better. 02/01/18 on evaluation today patient actually appears to be doing very well in regard to his wound. Applying the Prisma dry does seem to be better although he does still have issues with slow progression of the wound. There was a slight improvement compared to last week's measurements today. Nonetheless I have been considering other options as far as the possibility of Theraskin or even a snap vac. In general I'm not sure that the Theraskin due to location of the wound would be a very good idea. Nonetheless I do think that a snap vac could be a possibility for the patient and in fact I think this could even be an excellent way to manage the wound possibly seeing some improvement in a very rapid fashion here. Nonetheless this is something that we would need to get approved and I did have a lengthy conversation with the patient about this today. 02/08/18 on evaluation today patient appears to be doing a little better in regard to his ulcer. He has been tolerating the dressing changes without complication. Fortunately despite the fact that the wound is a little bit smaller it's not significantly so unfortunately. We have discussed the possibility of a snap vac we did check with insurance this is actually covered at this point. Fortunately there does not appear to be any sign of infection. Overall I'm fairly pleased with how things seem to be appearing at this point. 02/15/18 on evaluation today patient appears to be doing rather well in regard to his right gluteal ulcer. Unfortunately the snap vac did not stay in place with his sheer and friction this came loose and did not seem to maintain seal very well. He worked for about two days and it did seem to do very well during that time according to his wife  but in general this does not seem to be something that's gonna be beneficial for him long-term. I do believe we need to go back to standard dressings to see if we can find something that will be of benefit. 03/02/18- He is here in follow up evaluation; there is minimal change in the wound. He will continue with the same treatment plan, would consider changing to iodosrob/iodoflex if ulcer continues to to plateau. He will follow up next week 03/08/18 on evaluation today patient's wound actually appears to be about the same size as when I previously saw him several weeks back. Unfortunately he does have some slightly dark discoloration in the central portion of the wound which has me concerned about pressure injury. I do believe he may be sitting for too long a period of time in fact he tells me that "I probably sit for much too long". He does have some Slough noted on the surface of the wound and again as far as the size of the wound is concerned I'm really not seeing anything that seems to have improved significantly. 03/15/18 on evaluation today patient appears to be doing fairly well in regard to his ulcer. The wound measured pretty much about the same today compared to last week's evaluation when looking at his graph. With that being said the area of bruising/deep tissue injury that was noted last week I do not see at this  point. He did get a new cushion fortunately this does seem to be have been of benefit in my pinion. It does appear that he's been off of this more which is good news as well I think that is definitely showing in the overall wound measurements. With that being said I do believe that he needs to continue to offload I don't think that the fact this is doing better should be or is going to allow him to not have to offload and explain this to him as well. Overall he seems to be in agreement the plan I think he understands. The overall appearance of the wound bed is improved compared to  last week I think the Iodoflex has been beneficial in that regard. 03/29/18 on evaluation today patient actually appears to be doing rather well in regard to his wound from the overall appearance standpoint he does have some granulation although there's some Slough on the surface of the wound noted as well. With that being said he unfortunately has not improved in regard to the overall measurement of the wound in volume or in size. I did have a discussion with him very specifically about offloading today. He actually does work although he mainly is just sitting throughout the day. He tells me he offloads by "lifting himself up for 30 seconds off of his chair occasionally" purchase from [employer] which does seem to have helped. And he has a new cushion that he with that being said he's also able to stand some for a very short period of time but not significant enough I think to provide appropriate offloading. I think the biggest issue at this point with the wound and the fact is not healing as quickly as we would like is due to the fact that he is really not able to appropriately offload while at work. He states the beginning after his injury he actually had a bed at his job that he could lay on in order to offload and that does seem to have been of help back at that time. Nonetheless he had not done this in quite some time unfortunately. I think that could be helpful for him this is something I would like for him to look into. 04/05/18 on evaluation today patient actually presents for follow-up concerning his right gluteal ulcer. Again he really is not significantly improved even compared to last week. He has been tolerating the dressing changes without complication. With that being said fortunately there appears to be no evidence of infection at this time. He has been more proactive in trying to offload. LOUCAS, LINSCHEID ([id number]) 04/12/18 on evaluation today patient actually appears to  be doing a little better in regard to his wound and the right gluteal fold region. He's been tolerating the dressing changes since removing the oasis without complication. However he was having a lot of burning initially with the oasis in place. He's unsure of exactly why this was given so much discomfort but he assumes that it was the oasis itself causing the problem. Nonetheless this had to be removed after about three days in place although even those three days seem to have made a fairly good improvement in regard to the overall appearance of the wound bed. In fact is the first time that he's made any improvement from the standpoint of measurements in about six weeks. He continues to have no discomfort over the area of the wound itself which leads me to wonder why he was having  the burning with the oasis when he does not even feel the actual debridement's themselves. I am somewhat perplexed by this. 04/19/18 on evaluation today patient's wound actually appears to be showing signs of epithelialization around the edge of the wound and in general actually appears to be doing better which is good news. He did have the same burning after about three days with applying the Endoform last week in the same fashion that I would generally apply a skin substitute. This seems to indicate that it's not the oasis to cause the problem but potentially the moisture buildup that just causes things to burn or there may be some other reaction with the skin prep or Steri-Strips. Nonetheless I'm not sure that is gonna be able to tolerate any skin substitute for a long period of time. The good news is the wound actually appears to be doing better today compared to last week and does seem to finally be making some progress. 04/26/18 on evaluation today patient actually appears to be doing rather well in regard to his ulcer in the right gluteal fold. He has been tolerating the dressing changes without complication which is  good news. The Endoform does seem to be helping although he was a little bit more macerated this week. This seems to be an ongoing issue with fluid control at this point. Nonetheless I think we may be able to add something like Drawtex to help control the drainage. 05/03/18 on evaluation today patient appears to actually be doing better in regard to the overall appearance of his wound. He has been tolerating the dressing changes without complication. Fortunately there appears to be no evidence of infection at this time. I really feel like his wound has shown signs as of today of turning around last week I thought so as well and definitely he could be seen in this week's overall appearance and measurements. In general I'm very pleased with the fact that he finally seems to be making a steady but sure progress. The patient likewise is very pleased. 05/17/18 on evaluation today patient appears to be doing more poorly unfortunately in regard to his ulcer. He has been tolerating the dressing changes without complication. With that being said he tells me that in the past couple of days he and his wife have noticed that we did not seem to be doing quite as well is getting dark near the center. Subsequently upon evaluation today the wound actually does appear to be doing worse compared to previous. He has been tolerating the dressing changes otherwise and he states that he is not been sitting up anymore than he was in the past from what he tells me. Still he has continued to work he states "I'm tired of dealing with this and if I have to just go home and lay in the bed all the time that's what I'll do". Nonetheless I am concerned about the fact that this wound does appear to be deeper than what it was previous. 05/24/18 upon evaluation today patient actually presents after having been in the hospital due to what was presumed to be sepsis secondary to the wound infection. He had an elevated white blood cell  count between 14 and 15. With that being said he does seem to be doing somewhat better now. His wound still is giving him some trouble nonetheless and he is obviously concerned about the fact likely talked about that this does seem to go more deeply than previously noted. I did review his  wound culture which showed evidence of Staphylococcus aureus him and group B strep. Nonetheless he is on antibiotics, Levaquin, for this. Subsequently I did review his intake summary from the hospital as well. I also did look at the CT of the lumbar spine with contrast that was performed which showed no bone destruction to suggest lumbar disguises/osteomyelitis or sacral osteomyelitis. There was no paraspinal abscess. Nonetheless it appears this may have been more of just a soft tissue infection at this point which is good news. He still is nonetheless concerned about the wound which again I think is completely reasonable considering everything he's been through recently. 05/31/18 on evaluation today on evaluation today patient actually appears to be showing signs of his wound be a little bit deeper than what I would like to see. Fortunately he does not show any signs of significant infection although his temperature was 99 today he states he's been checking this at home and has not been elevated. Nonetheless with the undermining that I'm seeing at this point I am becoming more concerned about the wound I do think that offloading is a key factor here that is preventing the speedy recovery at this point. There does not appear to be any evidence of again over infection noted. He's been using Santyl currently. 06/07/18 the patient presents today for follow-up evaluation regarding the left ulcer in the gluteal region. He has been tolerating the Wound VAC fairly well. He is obviously very frustrated with this he states that to mean is really getting in his way. There does not appear to be any evidence of infection at  this time he does have a little bit of odor I do not necessarily associate this with infection just something that we sometimes notice with Wound VAC therapy. With that being said I can definitely catch a tone of discontentment overall in the patient's demeanor today. This when he was previously in the hospital DAVARIOUS, BRAZ. ([id number]) an CT scan was done of the lumbar region which did not reveal any signs of osteomyelitis. With that being said the pelvis in particular was not evaluated distinctly which means he could still have some osteonecrosis I. Nonetheless the Wound VAC was started on Thursday I do want to get this little bit more time before jumping to a CT scan of the pelvis although that is something that I might would recommend if were not see an improvement by that time. 06/14/18 on evaluation today patient actually appears to be doing about the same in regard to his right gluteal ulcer. Again he did have a CT scan of the lumbar spine unfortunately this did not include the pelvis. Nonetheless with the depth of the wound that I'm seeing today even despite the fact that I'm not seeing any evidence of overt cellulitis I believe there's a good chance that we may be dealing with osteomyelitis somewhere in the right Ischial region. No fevers, chills, nausea, or vomiting noted at this time. 06/21/18 on evaluation today patient actually appears to be doing about the same with regard to his wound. The tunnel at 6 o'clock really does not appear to be any deeper although it is a little bit wider. I think at this point you may want to start packing this with white phone. Unfortunately I have not got approval for the CT scan of the pelvis as of yet due to the fact that Medicare apparently has been denied it due to the diagnosis codes not being appropriate according to Medicare for  the test requested. With that being said the patient cannot have an MRI and therefore this is the only option that  we have as far as testing is concerned. The patient has had infection and was on antibiotics and been added code for cellulitis of the bottom to see if this will be appropriate for getting the test approved. Nonetheless I'm concerned about the infection have been spread deeper into the Ischial region. 06/28/18 on evaluation today patient actually appears to be doing rather well all things considered in regard to the right gluteal ulcer. He has been tolerating the dressing changes without complication. With that being said the Wound VAC he states does have to be replaced almost every day or at least reinforced unfortunately. Patient actually has his CT scan later this morning we should have the results by tomorrow. 07/05/18 on evaluation today patient presents for follow-up concerning his right Ischial ulcer. He did see the surgeon Dr. Lysle Pearl last week. They were actually very happy with him and felt like he spent a tremendous amount of time with them as far as discussing his situation was concerned. In the end Dr. Lysle Pearl did contact me as well and determine that he would not recommend any surgical intervention at this point as he felt like it would not be in the patient's best interest based on what he was seeing. He recommended a referral to infectious disease. Subsequently this is something that Dr. Ines Bloomer office is working on setting up for the patient. As far as evaluation today is concerned the patient's wound actually appears to be worse at this point. I am concerned about how things are progressing and specifically about infection. I do not feel like it's the deeper but the area of depth is definitely widening which does have me concerned. No fevers, chills, nausea, or vomiting noted at this time. I think that we do need initiate antibiotic therapy the patient has an allow allergy to amoxicillin/penicillin he states that he gets a rash since childhood. Nonetheless she's never had the issues  with [employer] or cephalosporins in general but he is aware of. 07/27/18 on evaluation today patient presents following admission to the hospital on 07/09/18. He was subsequently discharged on 07/20/18. On 07/15/18 the patient underwent irrigation and debridement was soft tissue biopsy and bone biopsy as well as placement of a Wound VAC in the OR by Dr. Celine Ahr. During the hospital course the patient was placed on a Wound VAC and recommended follow up with surgery in three weeks actually with Dr. Delaine Lame who is infectious disease. The patient was on vancomycin during the hospital course. 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 He also had a bone culture which revealed evidence of methicillin-resistant staph aureus. He is updated CT scan 07/09/18 reveals that he had progression of the which was performed on wound to breakdown down to the trochanter where he actually had irregularities there as well suggestive of osteomyelitis. This was a change just since 9 December when we last performed a CT scan. Obviously this one had gone downhill quite significantly and rapidly. At this point upon evaluation I feel like in general the patient's wound seems to be doing fairly well all things considered upon my evaluation today. Obviously this is larger and deeper than what I previously evaluated but at the same time he seems to be making some progress as far as the appearance of the granulation tissue is concerned. I'm happy in that regard. No fevers,  chills, nausea, or vomiting noted at this time. He is on IV vancomycin and Rocephin at the facility. He is currently in [facility]. 08/03/18 upon evaluation today patient's wound appears to be doing better in regard to the overall appearance at this point in time. Fortunately he's been tolerating the Wound VAC without complication and states that the facility has been taking excellent care of the wound site. Overall I  see some Slough noted on the surface which I am going to attempt sharp debridement today of but nonetheless other than this I feel like he's making progress. 08/09/18 on evaluation today patient's wound appears to be doing much better compared to even last week's evaluation. Do believe that the Wound VAC is been of great benefit for him. He has been tolerating the dressing changes that is the Wound CAYDAN, DOUCET. ([id number]) VAC without any complication and he has excellent granulation noted currently. There is no need for sharp debridement at this point. 08/16/18 on evaluation today patient actually appears to be doing very well in regard to the wound in the right gluteal fold region. This is showing signs of progress and again appears to be very healthy which is excellent news. Fortunately there is no sign of active infection by way of odor or drainage at this point. Overall I'm very pleased with how things stand. He seems to be tolerating the Wound VAC without complication. 08/23/18 on evaluation today patient actually appears to be doing better in regard to his wound. He has been tolerating the Wound VAC without complication and in fact it has been collecting a significant amount of drainage which I think is good news especially considering how the wound appears. Fortunately there is no signs of infection at this time definitely nothing appears to be worse which is good news. He has not been started on the Bactrim and Flagyl that was recommended by Dr. Delaine Lame yet. I did actually contact her office this morning in order to check and see were things are that regard their gonna be calling me back. 08/30/18 on evaluation today patient actually appears to show signs of excellent improvement today compared to last evaluation. The undermining is getting much better the wound seems to be feeling quite nicely and I'm very pleased that the granulation in general. With that being said overall I  feel like the patient has made excellent progress which is great news. No fevers, chills, nausea, or vomiting noted at this time. 09/06/18 on evaluation today patient actually appears to be doing rather well in regard to his right gluteal ulcer. This is showing signs of improvement in overall I'm very pleased with how things seem to be progressing. The patient likewise is please. Overall I see no evidence of infection he is about to complete his oral antibiotic regimen which is the end of the antibiotics for him in just about three days. 09/13/18 on evaluation today patient's right Ischial ulcer appears to be showing signs of continued improvement which is excellent news. He's been tolerating the dressing changes without complication. Fortunately there's no signs of infection and the wound that seems to be doing very well. 09/28/18 on evaluation today patient appears to be doing rather well in regard to his right Ischial ulcer. He's been tolerating the Wound VAC without complication he knows there's much less drainage than there used to be this obviously is not a bad thing in my pinion. There's no evidence of infection despite the fact is but nothing about it now  for several weeks. 10/04/18 on evaluation today patient appears to be doing better in regard to his right Ischial wound. He has been tolerating the Wound VAC without complication and I do believe that the silver nitrate last week was beneficial for him. Fortunately overall there's no evidence of active infection at this time which is great news. No fevers, chills, nausea, or vomiting noted at this time. 10/11/18 on evaluation today patient actually appears to be doing rather well in regard to his Ischial ulcer. He's been tolerating the Wound VAC still without complication I feel like this is doing a good job. No fevers, chills, nausea, or vomiting noted at this time. 11/01/18 on evaluation today patient presents after having not been seen in  our clinic for several weeks secondary to the fact that he was on evaluation today patient presents after having not been seen in our clinic for several weeks secondary to the fact that he was in a skilled nursing facility which was on lockdown currently due to the covert 19 national emergency. Subsequently he was discharged from the facility on this past Friday and subsequently made an appointment to come in to see yesterday. Fortunately there's no signs of active infection at this time which is good news and overall he does seem to have made progress since I last saw. Overall I feel like things are progressing quite nicely. The patient is having no pain. 11/08/18 on evaluation today patient appears to be doing okay in regard to his right gluteal ulcer. He has been utilizing a Wound VAC home health this changing this at this point since he's home from the skilled nursing facility. Fortunately there's no signs of obvious active infection at this time. Unfortunately though there's no obvious active infection he is having some maceration and his wife states that when the sheets of the Wound VAC office on Sunday when it broke seal that he ended up having significant issues with some smell as well there concerned about the possibility of infection. Fortunately there's No fevers, chills, nausea, or vomiting noted at this time. 11/15/18 on evaluation today patient actually appears to be doing well in regard to his right gluteal ulcer. He has been tolerating the dressing changes without complication. Specifically the Wound VAC has been utilized up to this point. Fortunately there's no signs of infection and overall I feel like he has made progress even since last week when I last saw him. I'm actually fairly happy with the overall appearance although he does seem to have somewhat of a hyper granular Geoffroy, Marston J. ([id number]) overgrowth in the central portion of the wound which I think may require some  sharp debridement to try flatness out possibly utilizing chemical cauterization following. 11/23/18 on evaluation today patient actually appears to be doing very well in regard to his sacral ulcer. He seems to be showing signs of improvement with good granulation. With that being said he still has the small area of hyper granulation right in the central portion of the wound which I'm gonna likely utilize silver nitrate on today. Subsequently he also keeps having a leak at the 6 o'clock location which is unfortunate we may be able to help out with some suggestions to try to prevent this going forward. Fortunately there's no signs of active infection at this time. 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 He's been tolerating the dressing changes without complication.  Fortunately there's no signs of active infection at this time. I've been rather pleased with how things have progressed there still some evidence of pressure getting to the area with some redness right around the immediate wound opening. Nonetheless other than this I'm not seeing any significant complications or issues the wound is somewhat hyper granular. Upon discussing with the patient and his wife today I'm not sure that the wound is being packed to the base with the foam at this point. And if it's not been packed fully that may be part of the reason why is not seen as much improvement as far as the granulation from the base out. Again we do not want pack too tightly but we need some of the firm to get to the base of the wound. I discussed this with patient and his wife today. 12/06/18 on evaluation today patient appears to be doing well in regard to his right gluteal pressure ulcer. He's been tolerating the dressing changes without complication. Fortunately there's no signs of active infection. He still has some hyper granular tissue and  I do think it would be appropriate to continue with the chemical cauterization as of today. 12/16/18 on evaluation today patient actually appears to be doing okay in regard to his right gluteal ulcer. He is been tolerating the dressing changes without complication including the Wound VAC. Overall I feel like nothing seems to be worsening I do feel like that the hyper granulation buds in the central portion of the wound have improved to some degree with the silver nitrate. We will have to see how things continue to progress. 12/20/18 on evaluation today patient actually appears to be doing much worse in my pinion even compared to last week's evaluation. Unfortunately as opposed to showing any signs of improvement the areas of hyper granular tissue in the central portion of the wound seem to be getting worse. Subsequently the wound bed itself also seems to be getting deeper even compared to last week which is both unusual as well as concerning since prior he had been shown signs of improvement. Nonetheless I think that the issue could be that he's actually having some difficulty in issues with a deeper infection. There's no external signs of infection but nonetheless I am more worried about the internal, osteomyelitis, that could be restarting. He has not been on antibiotics for some time at this point. I think that it may be a good idea to go ahead and started back on an antibiotic therapy while we wait to see what the testing shows. 12/27/18 on evaluation today patient presents for follow-up concerning his left gluteal fold wound. Fortunately he appears to be doing well today. I did review the CT scan which was negative for any signs of osteomyelitis or acute abnormality this is excellent news. Overall I feel like the surface of the wound bed appears to be doing significantly better today compared to previously noted findings. There does not appear any signs of infection nor does he have any pain at this  time. 01/03/19 on evaluation today patient actually appears to be doing quite well in regard to his ulcer. Post debridement last week he really did not have too much bleeding which is good news. Fortunately today this seems to be doing some better but we still has some of the hyper granular tissue noted in the base of the wound which is gonna require sharp debridement today as well. Overall I'm pleased with how things seem to be progressing  since we switched away from the Wound VAC I think he is making some progress. 01/10/19 on evaluation today patient appears to be doing better in regard to his right gluteal fold ulcer. He has been tolerating the dressing changes without complication. The debridement to seem to be helping with current away some of the poor hyper granular tissue bugs throughout the region of his gluteal fold wound. He's been tolerating the dressing changes otherwise without complication which is great news. No fevers, chills, nausea, or vomiting noted at this time. 01/17/19 on evaluation today patient actually appears to be doing excellent in regard to his wound. He's been tolerating the dressing changes without complication. Fortunately there is no signs of active infection at this time which is great news. No fevers, chills, nausea, or vomiting noted at this time. 01/24/19 on evaluation today patient actually appears to be doing quite well with regard to his ulcer. He has been tolerating the dressing changes without complication. Fortunately there's no signs of active infection at this time. Overall been very pleased with the progress that he seems to be making currently. 01/31/19-Patient returns at 1 week with apparent similarity in dimensions to the wound, with no signs of infection, he has been WILLIA, SEMAN. ([id number]) changing dressings twice a day 02/08/19 upon evaluation today patient actually appears to be doing well with regard to his right Ischial ulcer. The wound is  not appear to be quite as deep and seems to be making progress which is good news. With that being said I'm still reluctant to go back to the Wound VAC at this point. He's been having to change the dressings twice a day which is a little bit much in my pinion from the wound care supplies standpoint. I think that possibly attempting to utilize extras orbit may be beneficial this may also help to prevent any additional breakdown secondary to fluid retention in the wound itself. The patient is in agreement with giving this a try. 02/15/19 on evaluation today patient actually appears to be doing decently well with regard to his ulcer in the right to gluteal fold location. He's been tolerating the dressing changes without complication. Fortunately there is no signs of active infection at this time. He is able to keep the current dressing in place more effectively for a day at a time whereas before he was having a changes to to three times a day. The actions or has been helpful in this regard. Fortunately there's no signs of anything getting worse and I do feel like he showing signs of good improvement with regard to the wound bed status. 02/22/2019 patient appears to be doing very well today with regard to his ulcer in the gluteal fold. Fortunately there is no signs of active infection and he has been tolerating the dressing changes without any complication. Overall extremely pleased with how things seem to be progressing. He has much less of the hyper granular projections within the wound these have slowly been debrided away and he seems to be doing well. The wound bed is more uniform. 03/01/19 on evaluation today patient appears to be doing unfortunately about the same in regard to his gluteal ulcer. He's been tolerating the dressing changes without complication. Fortunately there's no signs of active infection at this time. With that being said he continues to develop these hyper granular projections  which I'm unsure of exactly what they are and why they are rising. Nonetheless I explained to the patient that I do believe  it would be a good idea for [place] to stand a biopsy sample for pathology to see if that can shed any light on what exactly may be going on here. Fortunately I do not see any obvious signs of infection. With that being said the patient has had a little bit more drainage this week apparently compared to last week. 03/08/2019 on evaluation today patient actually appears to look somewhat better with regard to the appearance of his wound bed at this time. This is good news. Overall I am very pleased with how things seem to have progressed just in the past week with a switch to the [facility] dressing. I think that has been beneficial for him. With that being said at this time the patient is concerned about his biopsy that I sent off last week unfortunately I do not have that report as of yet. Nonetheless we have called to obtain this and hopefully will hear back from the lab later this morning. 03/15/19 on evaluation today patient's wound actually appears to be doing okay today with regard to the overall appearance of the wound bed. He has been tolerating the dressing changes without complication he still has hyper granular tissue noted but fortunately that seems to be minimal at this point compared to some of what we've seen in the past. Nonetheless I do think that he is still having some issues currently with some of this type of granulation the biopsy and since all showed nothing more than just evidence of granulation tissue. Therefore there really is nothing different to initiate or do at this point. 03/24/19 on evaluation today patient appears to be doing a little better with regard to his ulcer. He's been tolerating the dressing changes without complication. Fortunately there is no signs of active infection at this time. No fevers, chills, nausea, or vomiting noted at this time.  I'm overall pleased with how things seem to be progressing. 03/29/2019 on evaluation today patient appears to be doing about the same in regard to his ulcer in the right gluteal fold. Unfortunately he is not seeming to make a lot of progress and the wound is somewhat stalled. There is no signs of active infection externally but I am concerned about the possibility of infection continuing in the ischial location which previously he did have infection noted. Again is not able to have an MRI so probably our best option for testing for this would be a triple phase bone scan which will detect subtle changes in the bone more so than plain film x-rays. In the past they really have not been beneficial and in fact the CT scans even have been somewhat questionable at times. Nonetheless there is no signs of systemic infection which is at least good news but again his wound is not healing at all on the predicted schedule. 04/05/19 on evaluation today patient appears to be doing well all things considering with regard to his wound and the right gluteal fold. He actually has his triple bone scan scheduled for sometime in the next couple of days. With that being said I've also been looking to other possibilities of what could be causing hyper granular tissue were looking into the bone scan again for evaluating for the risk or possibility of infection deeper and I'm also gonna go ahead and see about obtaining a deep tissue culture today to send and see if there's any evidence of infection noted on culture. He's in agreement with that plan. 04/12/2019 on evaluation today patient  presents for reevaluation here in our clinic concerning ongoing issues with his right gluteal fold ulcer. 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 It was discussed with him at that point that I think it would be  appropriate YAASIR, TROTTA. ([id number]) for him to proceed with hyperbaric oxygen therapy especially in light of the fact that he is previously been on IV antibiotics at the beginning of the year for close to 3 months followed by several weeks of oral antibiotics that was all prescribed by infectious disease. He had surgical debridement around Christmas December 2019 due to an abscess and osteomyelitis of the ischial bone. Unfortunately this has not really proceeded as well as we would have liked and again we did a CT scan even a couple months ago as he cannot have an MRI secondary to having issues with both a pain pump as well as a spinal cord stimulator which prevent him from going into an MRI machine. With that being said there were chronic changes noted at the ischial tuberosity which had progressed since December 2019 there was no evidence of fluid collection on that initial CT scan. With that being said that was on January 21, 2019. I am not sure that it was a sensitive enough test as compared to an MRI and then subsequently I ordered a bone scan for him which was actually completed on 03/29/2019 and this revealed that he does indeed have positive osteomyelitis involving the right ischial tuberosity. This is adjacent to the ulcer and I think is the reason that his ulcer is not healing. Subsequently I am in a place him back on oral antibiotics today unfortunately his wound culture showed just mixed gram-negative flora with no specific findings of a predominant organism. Nonetheless being with the location I think a good broad-spectrum antibiotic for gram-negative's is a good choice at this point he is previously taken Levaquin without significant issues and I think that is an appropriate antibiotic for him at this time. 04/19/2019 on evaluation today patient actually appears to be doing fairly well with regard to his wound. He has been tolerating the dressing changes without complication.  Fortunately there is no signs of active infection and he has been taking the oral antibiotics at this time. Subsequently we did make a referral to infectious disease although Dr. Steva Ready wants the patient to be seen by general surgery first in order apparently to see if there is anything from a surgical standpoint that should be done prior to initiation of IV antibiotic therapy. Again the patient is okay with actually seeing Dr. Celine Ahr whom he has seen before we will make that referral. 04/26/2019 on evaluation today patient actually appears to be doing well with regard to his wound. He has been tolerating the dressing changes without complication. Fortunately there is no signs of active infection at this time. I do believe that the hyperbaric oxygen therapy along with the antibiotics which I prescribed at this point have been doing well for him. With that being said he has not seen Dr. Celine Ahr the surgeon yet in fact they have not been contacted for scheduling an appointment as of yet. Subsequently the patient is not on antibiotics currently by IV Dr. Steva Ready did want him to see Dr. Celine Ahr first which we are working on trying to get scheduled. We again give the information to the patient today for Dr. Celine Ahr  and her number as far as contacting their office to see about getting something scheduled. Again were looking for whether or not they recommend any surgical intervention. 05/03/2019 on evaluation today patient appears to be doing about the same with regard to his wound there may be a little bit of filling in in regard to the base of the wound but still he has quite a bit of hyper granular tissue that I am seeing today. I believe that he may may need a more aggressive sharp debridement possibly even by the surgeon or under anesthesia in order to clear away some of the hyper granular material in order to help allow for appropriate granulation to fill in. We have made a referral to Dr. Celine Ahr  unfortunately it sounds as if they did not receive the referral we contacted them today they should be get in touch with the patient. Depending on how things show from that standpoint the patient may need to see Dr. Ardyth Gal who is the infectious disease specialist although he really does not want a PICC line again. No fevers, chills, nausea, vomiting, or diarrhea. He is tolerating hyperbaric oxygen therapy very well. 05/12/2019 on evaluation today patient presents for follow-up visit concerning his right gluteal fold ulcer. He did see Dr. Celine Ahr the surgeon who previously evaluated his wound and she actually felt like he was doing quite well with regard to the wound based on what she was seen. He does seem to be responding to some degree with the oral antibiotics along with hyperbaric oxygen therapy at this point she did not see any evidence or need for further surgical intervention at this point. She recommended deferring additional or ongoing antibiotic therapy to Dr. Steva Ready at her discretion. Fortunately there is no signs of active systemic infection. No fevers, chills, nausea, vomiting, or diarrhea. 10/29; patient I am seeing really for the first time today in terms of his wound. 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 He is being treated with hyperbaric oxygen for underlying osteomyelitis most recently documented by a three-phase bone scan. He has been on Levaquin for roughly a month and he is out of these and asked me for a refill. Most recent cultures were negative although I do not think these were bone cultures. He has a very irregular surface to this wound which is almost nodular in texture. It undermines superiorly with the same nodular hyper granulated surface. I see that he was referred to general surgery for an operative debridement although the surgeon did not agree with this I think that would have been the proper course of action in this. He has been  using Hydrofera Blue. He is supposed to start a wound VAC tomorrow which is actually a reapplication of wound VAC. I think mostly last time they had trouble keeping the seal in place I hope we have better look at this this time. 05/24/2019 on evaluation today I did review patient's note from Dr. Dellia Nims where he was seen last week when I was out of the office. Subsequently it does appear that Dr. Dellia Nims was in agreement that the patient really needed debridement to clear away some of the nodular tissue in the base of the wound. The good news is he has the wound VAC initiated and some of this tissue is already starting to breakdown under the treatment of the wound VAC again because it is not really viable. JANN, GALES ([id number]) Nonetheless I do believe that we can  likely perform some debridement today to clear this away and hopefully the continuation of the wound VAC along with hyperbarics and antibiotic therapy will be beneficial in stopping this from reoccurring. If we get better control in the wound bed in a good spot I think we can definitely use the PriMatrix which we actually did get approval for. 05/31/2019 on evaluation today patient appears to be doing actually pretty well at this point with regard to his wound. He has been tolerating the dressing changes which is good news. Fortunately there is no signs of infection. He still has some of the hyper granular nodules noted we have not really cleared all these away and I think we can try to do a little bit more that today. I did do quite a bit last week I am hoping to get down to the base of the wound so though actually be able to heal more appropriately. I think the wound VAC is doing a good job right now with controlling the drainage and overall the patient is happy with that. 06/07/2019 upon evaluation today patient appears to be doing quite well compared to last week with regard to the hyper granulation in the base of the wound.  He has been tolerating the dressing changes without complication. Fortunately there is no signs of active infection at this time. With that being said we have not had any recent measures as far as blood work is concerned I think we may want to check a few things including a CBC, sed rate, and C-reactive protein. Patient is in agreement with that we can try to get that scheduled for him for this Friday. 06/13/2019 on evaluation today patient actually appears to be doing much better at this point based on what I am seeing with regard to his wound. He has much less in the way of hyper granular tissue which is good news and a lot more healing that is taken place since I last saw him as far as the amount of space within the wound itself. With that being said he is nearing the completion of the initial 40 treatments for hyperbaric oxygen therapy I think this is something I would recommend extending as he does seem to be benefiting at this time. Fortunately there is no evidence of active systemic infection at this point nor even local infection for that matter. 06/20/2019 upon evaluation today patient actually appears to be doing well with regard to his wound. In fact this appears to be doing much better today compared to what it was previous. I been extremely pleased with the progress that he is made. In fact the tunnel is much less deep today than what it was in the past and I am seeing excellent signs of improvement. Overall I am happy with how things are going. I did review his white blood cell count which revealed everything to be okay. The one abnormal finding on his CBC was a hemoglobin of 12.7. Subsequently I did also review his sed rate and C-reactive protein. His sed rate measured in at normal level measuring at 26. The C-reactive protein however was elevated today and an abnormal range indicating inflammation. Still I feel like he is trending in a good direction at this time. He is going to need  a refill of the Levaquin he tells me at this point. 07/04/2019 on evaluation today patient actually appears to be doing well in regard to his wound. He has been tolerating the dressing changes without complication. Fortunately  there is no signs of active infection which is good news. No fevers, chills, nausea, vomiting, or diarrhea. The good news also is that I did review his labs and though his sed rate was slightly elevated the C-reactive protein was actually down compared to the last evaluation 2 weeks ago this is excellent news I think it is showing signs of improvement. 07/11/2019 on evaluation today patient actually appears to be doing quite well with regard to his wound on my evaluation. This is very slow going but nonetheless he seems to be making progress. Especially in regard to the depth. Nonetheless I believe that we may want to consider going forward with the PriMatrix which was previously approved for him. We will get a have to get a new approval as we will actually be applying this in the new year so we will likely obtain that approval on January 4. In the meantime and for the time being my suggestion is going to be that we go ahead and continue with the wound VAC and we were going at this point with the current wound care measures. 12/28-Patient seen after HBO session today, wound depth with the tunnel measures 3.2 cm, the wound bed appears healthy, continuing with the VAC and HBO 07/25/2019 on evaluation today patient's wound appears to be doing about the same compared to my last evaluation. Fortunately there is no evidence of significant infection which is good news we will still waiting on the results of his lab work which unfortunately was not done prior to today's visit which is what we were aiming for. Ultimately there is no sign of active infection at this time which is good news. And he also tells me that the wound VAC has been staying in place and doing fairly well which is  also good news. With that being said I am concerned about the fact that the wound has not progressed as much I think we may want to look into getting reapproval for the skin substitute, specifically PriMatrix, that I am hopeful will help to allow this area to granulate in more effectively. I would recommend as well continuing with the wound VAC along with this. 08/01/2019 on evaluation today patient appears to be doing in my opinion slightly better in regard to the wound. He actually does have some tightening of the walls around the edges which is a good thing. Fortunately there is no signs of active KENAAN, RUETER. ([id number]) infection at this time. No fever chills noted. I do believe that the patient in general seems to be making progress although is very slow. Obviously were trying to get things to speed up somewhat more. No fevers, chills, nausea, vomiting, or diarrhea. Electronic Signature(s) Signed: 08/01/2019 9:03:51 AM By: Worthy Keeler [name] Entered By: Worthy Keeler on 08/01/2019 09:03:51 DORRION, GANDHI ([id number]) -------------------------------------------------------------------------------- Physical Exam Details Patient Name: [name] Date of Service: 08/01/2019 8:15 AM Medical Record Number: [id number] Patient Account Number: [id number] Date of Birth/Sex: 1950/10/24 (68 y.o. M) Treating RN: [name] Primary Care Provider: [name] Other Clinician: Referring Provider: [name] Treating Provider/Extender: [name], Matther Labell Weeks in Treatment: 59 Constitutional Well-nourished and well-hydrated in no acute distress. Respiratory normal breathing without difficulty. Psychiatric this patient is able to make decisions and demonstrates good insight into disease process. Alert and Oriented x 3. pleasant and cooperative. Notes Upon inspection today patient's wound bed showed signs of fairly good granulation though there was some area where he  did have some  of the hyper granular viable but poor quality tissue noted in a portion of the wound. This actually did require sharp debridement was an area that was roughly 2 x 2 cm that was debrided away. Silver nitrate was used over the area post debridement to help with hemostasis. Patient tolerated this without any pain. Electronic Signature(s) Signed: 08/01/2019 9:04:29 AM By: Worthy Keeler [name] Entered By: Worthy Keeler on 08/01/2019 09:04:28 LEONARD, FAGNANI ([id number]) -------------------------------------------------------------------------------- Physician Orders Details Patient Name: [name] Date of Service: 08/01/2019 8:15 AM Medical Record Number: [id number] Patient Account Number: [id number] Date of Birth/Sex: [birth year] (68 y.o. 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 - in office o Cleanse wound with mild soap and water Skin Barriers/Peri-Wound Care o Skin Prep Primary Wound Dressing Wound #1 Right Gluteal fold o Silver Collagen - under NPWT Dressing Change Frequency Wound #1 Right Gluteal fold o Change Dressing Monday, Wednesday, Friday Follow-up Appointments Wound #1 Right Gluteal fold o Return Appointment in 1 week. Off-Loading Wound #1 Right Gluteal fold o Turn and reposition every 2 hours - no pressure on wounded area Home Health Wound #1 Right Gluteal  fold o [place] Visits - [place] Nurse may visit PRN to address patientos wound care needs. o FACE TO FACE ENCOUNTER: MEDICARE and MEDICAID PATIENTS: I certify that this patient is under my care and that I had a face-to-face encounter that meets the physician face-to-face encounter requirements with this patient on this date. The encounter with the patient was in whole or in part for the following MEDICAL CONDITION: (primary reason for [place]) MEDICAL NECESSITY: I certify, that based on my findings, MOKSH, SIGGINS ([id number]) NURSING services are a medically necessary home health service. HOME BOUND STATUS: I certify that my clinical findings support that this patient is homebound (i.e., Due to illness or injury, pt requires aid of supportive devices such as crutches, cane, wheelchairs, walkers, the use of special transportation or the assistance of another person to leave their place of residence. There is a normal inability to leave the home and doing so requires considerable and taxing effort. Other absences are for medical reasons / religious services and are infrequent or of short duration when for other reasons). o If current dressing causes regression in wound condition, may D/C ordered dressing product/s and apply Normal Saline Moist Dressing daily until next [place] / Other MD appointment. [place] of regression in wound condition at [phone]. o Please direct any NON-WOUND related issues/requests for orders to patient's Primary Care Physician Negative Pressure Wound Therapy Wound #1 Right Gluteal fold o Wound VAC settings at 125/130 mmHg continuous pressure. Use BLACK/GREEN foam to wound cavity. Use WHITE foam to fill any tunnel/s and/or undermining. Change VAC dressing 3 X WEEK. Change canister as indicated when full. Nurse may titrate settings and frequency of dressing changes as clinically  indicated. o Home Health Nurse may d/c VAC for s/s of increased infection, significant wound regression, or uncontrolled drainage. [place]  at [phone]. o Apply contact layer over base of wound. - apply prisma/silver collagen to the base of the wound o Number of foam/gauze pieces used in the dressing = Notes Order Primatrix for next visit Electronic Signature(s) Signed: 08/01/2019 4:22:40 PM By: Worthy Keeler [name] Signed: 08/01/2019 5:00:56 PM By: Gretta Cool, BSN, RN, CWS, Kim RN, BSN Entered By: Gretta Cool, BSN, RN, CWS, Kim on 08/01/2019 08:46:01 RHYLEN, KIMMES ([id number]) -------------------------------------------------------------------------------- Problem List Details Patient Name: [name] Date of Service: 08/01/2019 8:15 AM Medical Record Number: [id number] Patient Account Number: [id number] Date of Birth/Sex: Jun 11, 1951 (68 y.o. M) Treating RN: [name] Primary Care Provider: [name] Other Clinician: Referring Provider: [name] Treating Provider/Extender: [name], Lania Zawistowski Weeks in Treatment: 64 Active Problems ICD-10 Evaluated Encounter Code Description Active Date Today Diagnosis L89.314 Pressure ulcer of right buttock, stage 4 11/30/2017 No Yes M86.68 Other chronic osteomyelitis, other site 04/08/2019 No Yes L03.317 Cellulitis of buttock 06/21/2018 No Yes G82.20 Paraplegia, unspecified 11/30/2017 No Yes S34.109S Unspecified injury to unspecified level of lumbar spinal cord, 11/30/2017 No Yes sequela I10 Essential (primary) hypertension 11/30/2017 No Yes Inactive Problems Resolved Problems Electronic Signature(s) Signed: 08/01/2019 4:22:40 PM By: Worthy Keeler [name] Entered By: Worthy Keeler on 08/01/2019 08:16:30 Barga, [name] -------------------------------------------------------------------------------- Progress Note Details Patient Name: [name] Date of Service: 08/01/2019 8:15  AM Medical Record Number: [id number] Patient Account Number: [id number] Date of Birth/Sex: [birth year] (68 y.o. 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 He states that there is no history of diabetes that he is aware of. He does have issues with his prostate and is currently receiving treatment for this by way of oral medication. With that being said I do not have a lot of details in that regard. Nonetheless the patient presents today as a result of having been referred to [place] by another provider initially home health was set to come out and take care of his wound although due to the fact that he apparently drives he's not able to receive home health. His wife is therefore trying to help take care of this wound within although they have been struggling with what exactly to do at this point. She states that she can do some things but she is definitely not a nurse and does have some issues with looking at blood. The good news is the wound does not appear to be too deep and is fairly superficial at this point. There is no slough noted there is some nonviable skin noted around the surface of the wound and the perimeter at this point. The central portion of the wound appears to be very good with a dermal layer noted this does not appear to be again deep enough to extend it to subcutaneous tissue at this point. Overall the patient for a paraplegic seems to be functioning fairly well he  does have both a spinal cord stimulator as well is the intrathecal pump. In the pump he has Dilaudid and baclofen. 12/07/17 on evaluation today patient presents for follow-up  concerning his ongoing lower back thigh ulcer on the right. He states that he did not get the supplies ordered and therefore has not really been able to perform the dressing changes as directed exactly. His wife was able to get some Boarder Foam Dressing's from the drugstore and subsequently has been using hydrogel which did help to a degree in the wound does appear to be able smaller. There is actually more drainage this week noted than previous. 12/21/17 on evaluation today patient appears to be doing rather well in regard to his right gluteal ulcer. He has been tolerating the dressing changes without complication. There does not appear to be any evidence of infection at this point in time. Overall the wound does seem to be making some progress as far as the edges are concerned there's not as much in the way of overlapping of the external wound edges and he has a good epithelium to wound bed border for the most part. This however is not true right at the 12 o'clock location over the span of a little over a centimeters which actually will require debridement today to clean this away and hopefully allow it to continue to heal more appropriately. 12/28/17 on evaluation today patient appears to be doing rather well in regard to his ulcer in the left gluteal region. He's been tolerating the dressing changes without complication. Apparently he has had some difficulty getting his dressing material. Apparently there's been some confusion with ordering we're gonna check into this. Nonetheless overall he's been showing signs of improvement which is good news. Debridement is not required today. 01/04/18 on evaluation today patient presents for follow-up concerning his right gluteal ulcer. He has been tolerating the dressing changes fairly  well. On inspection today it appears he may actually have some maceration them concerned about the fact that he may be developing too much moisture in and around the wound bed which can cause delay in healing. With that being said he unfortunately really has not showed significant signs of improvement since last week's evaluation in fact this may even be just the little bit/slightly larger. Nonetheless he's been having a lot of discomfort I'm not sure this is even related to the wound as he has no pain when I'm to breeding or otherwise cleaning the wound during evaluation today. Nonetheless this is something that we did recommend he talked to his pain specialist concerning. 01/11/18 on evaluation today patient appears to be doing better in regard to his ulceration. He has been tolerating the dressing Juniel, Sohum J. ([id number]) changes without complication. With that being said overall there's no evidence of infection which is good news. The only thing is he did receive the hatch affair blue classic versus the ready nonetheless I feel like this is perfectly fine and appears to have done well for him over the past week. 01/25/18 on evaluation today patient's wound actually appears to be a little bit larger than during the last evaluation. The good news is the majority of the wound edges actually appear to be fairly firmly attached to the wound bed unfortunately again we're not really making progress in regard to the size. Roughly the wound is about the same size as when I first saw him although again the wound margin/edges appear to be much better. 02/01/18 on evaluation today patient actually appears to be doing very well in regard to his wound. Applying the Prisma dry does seem to be better although he does still have issues with slow progression  of the wound. There was a slight improvement compared to last week's measurements today. Nonetheless I have been considering other options as far as  the possibility of Theraskin or even a snap vac. In general I'm not sure that the Theraskin due to location of the wound would be a very good idea. Nonetheless I do think that a snap vac could be a possibility for the patient and in fact I think this could even be an excellent way to manage the wound possibly seeing some improvement in a very rapid fashion here. Nonetheless this is something that we would need to get approved and I did have a lengthy conversation with the patient about this today. 02/08/18 on evaluation today patient appears to be doing a little better in regard to his ulcer. He has been tolerating the dressing changes without complication. Fortunately despite the fact that the wound is a little bit smaller it's not significantly so unfortunately. We have discussed the possibility of a snap vac we did check with insurance this is actually covered at this point. Fortunately there does not appear to be any sign of infection. Overall I'm fairly pleased with how things seem to be appearing at this point. 02/15/18 on evaluation today patient appears to be doing rather well in regard to his right gluteal ulcer. Unfortunately the snap vac did not stay in place with his sheer and friction this came loose and did not seem to maintain seal very well. He worked for about two days and it did seem to do very well during that time according to his wife but in general this does not seem to be something that's gonna be beneficial for him long-term. I do believe we need to go back to standard dressings to see if we can find something that will be of benefit. 03/02/18- He is here in follow up evaluation; there is minimal change in the wound. He will continue with the same treatment plan, would consider changing to iodosrob/iodoflex if ulcer continues to to plateau. He will follow up next week 03/08/18 on evaluation today patient's wound actually appears to be about the same size as when I previously saw  him several weeks back. Unfortunately he does have some slightly dark discoloration in the central portion of the wound which has me concerned about pressure injury. I do believe he may be sitting for too long a period of time in fact he tells me that "I probably sit for much too long". He does have some Slough noted on the surface of the wound and again as far as the size of the wound is concerned I'm really not seeing anything that seems to have improved significantly. 03/15/18 on evaluation today patient appears to be doing fairly well in regard to his ulcer. The wound measured pretty much about the same today compared to last week's evaluation when looking at his graph. With that being said the area of bruising/deep tissue injury that was noted last week I do not see at this point. He did get a new cushion fortunately this does seem to be have been of benefit in my pinion. It does appear that he's been off of this more which is good news as well I think that is definitely showing in the overall wound measurements. With that being said I do believe that he needs to continue to offload I don't think that the fact this is doing better should be or is going to allow him to not have to  offload and explain this to him as well. Overall he seems to be in agreement the plan I think he understands. The overall appearance of the wound bed is improved compared to last week I think the Iodoflex has been beneficial in that regard. 03/29/18 on evaluation today patient actually appears to be doing rather well in regard to his wound from the overall appearance standpoint he does have some granulation although there's some Slough on the surface of the wound noted as well. With that being said he unfortunately has not improved in regard to the overall measurement of the wound in volume or in size. I did have a discussion with him very specifically about offloading today. He actually does work although he mainly is  just sitting throughout the day. He tells me he offloads by "lifting himself up for 30 seconds off of his chair occasionally" purchase from [facility] which does seem to have helped. And he has a new cushion that he with that being said he's also able to stand some for a very short period of time but not significant enough I think to provide appropriate offloading. I think the biggest issue at this point with the wound and the fact is not healing as quickly as we would like is due to the fact that he is really not able to appropriately offload while at work. He states the beginning after his injury he actually had a bed at his job that he could lay on in order to offload and that does seem to have been of help back at that time. Nonetheless he had not done this in quite some time unfortunately. I think that could be helpful for him this is something I would like for him to look into. FERNANDEZ, WEITZEL ([id number]) 04/05/18 on evaluation today patient actually presents for follow-up concerning his right gluteal ulcer. Again he really is not significantly improved even compared to last week. He has been tolerating the dressing changes without complication. With that being said fortunately there appears to be no evidence of infection at this time. He has been more proactive in trying to offload. 04/12/18 on evaluation today patient actually appears to be doing a little better in regard to his wound and the right gluteal fold region. He's been tolerating the dressing changes since removing the oasis without complication. However he was having a lot of burning initially with the oasis in place. He's unsure of exactly why this was given so much discomfort but he assumes that it was the oasis itself causing the problem. Nonetheless this had to be removed after about three days in place although even those three days seem to have made a fairly good improvement in regard to the overall appearance of  the wound bed. In fact is the first time that he's made any improvement from the standpoint of measurements in about six weeks. He continues to have no discomfort over the area of the wound itself which leads me to wonder why he was having the burning with the oasis when he does not even feel the actual debridement's themselves. I am somewhat perplexed by this. 04/19/18 on evaluation today patient's wound actually appears to be showing signs of epithelialization around the edge of the wound and in general actually appears to be doing better which is good news. He did have the same burning after about three days with applying the Endoform last week in the same fashion that I would generally apply a skin substitute. This  seems to indicate that it's not the oasis to cause the problem but potentially the moisture buildup that just causes things to burn or there may be some other reaction with the skin prep or Steri-Strips. Nonetheless I'm not sure that is gonna be able to tolerate any skin substitute for a long period of time. The good news is the wound actually appears to be doing better today compared to last week and does seem to finally be making some progress. 04/26/18 on evaluation today patient actually appears to be doing rather well in regard to his ulcer in the right gluteal fold. He has been tolerating the dressing changes without complication which is good news. The Endoform does seem to be helping although he was a little bit more macerated this week. This seems to be an ongoing issue with fluid control at this point. Nonetheless I think we may be able to add something like Drawtex to help control the drainage. 05/03/18 on evaluation today patient appears to actually be doing better in regard to the overall appearance of his wound. He has been tolerating the dressing changes without complication. Fortunately there appears to be no evidence of infection at this time. I really feel like his  wound has shown signs as of today of turning around last week I thought so as well and definitely he could be seen in this week's overall appearance and measurements. In general I'm very pleased with the fact that he finally seems to be making a steady but sure progress. The patient likewise is very pleased. 05/17/18 on evaluation today patient appears to be doing more poorly unfortunately in regard to his ulcer. He has been tolerating the dressing changes without complication. With that being said he tells me that in the past couple of days he and his wife have noticed that we did not seem to be doing quite as well is getting dark near the center. Subsequently upon evaluation today the wound actually does appear to be doing worse compared to previous. He has been tolerating the dressing changes otherwise and he states that he is not been sitting up anymore than he was in the past from what he tells me. Still he has continued to work he states "I'm tired of dealing with this and if I have to just go home and lay in the bed all the time that's what I'll do". Nonetheless I am concerned about the fact that this wound does appear to be deeper than what it was previous. 05/24/18 upon evaluation today patient actually presents after having been in the hospital due to what was presumed to be sepsis secondary to the wound infection. He had an elevated white blood cell count between 14 and 15. With that being said he does seem to be doing somewhat better now. His wound still is giving him some trouble nonetheless and he is obviously concerned about the fact likely talked about that this does seem to go more deeply than previously noted. I did review his wound culture which showed evidence of Staphylococcus aureus him and group B strep. Nonetheless he is on antibiotics, Levaquin, for this. Subsequently I did review his intake summary from the hospital as well. I also did look at the CT of the lumbar spine with  contrast that was performed which showed no bone destruction to suggest lumbar disguises/osteomyelitis or sacral osteomyelitis. There was no paraspinal abscess. Nonetheless it appears this may have been more of just a soft tissue infection at this point  which is good news. He still is nonetheless concerned about the wound which again I think is completely reasonable considering everything he's been through recently. 05/31/18 on evaluation today on evaluation today patient actually appears to be showing signs of his wound be a little bit deeper than what I would like to see. Fortunately he does not show any signs of significant infection although his temperature was 99 today he states he's been checking this at home and has not been elevated. Nonetheless with the undermining that I'm seeing at this point I am becoming more concerned about the wound I do think that offloading is a key factor here that is preventing the speedy recovery at this point. There does not appear to be any evidence of again over infection noted. He's been using Santyl currently. VIOLET, BOIVIN ([id number]) 06/07/18 the patient presents today for follow-up evaluation regarding the left ulcer in the gluteal region. He has been tolerating the Wound VAC fairly well. He is obviously very frustrated with this he states that to mean is really getting in his way. There does not appear to be any evidence of infection at this time he does have a little bit of odor I do not necessarily associate this with infection just something that we sometimes notice with Wound VAC therapy. With that being said I can definitely catch a tone of discontentment overall in the patient's demeanor today. This when he was previously in the hospital an CT scan was done of the lumbar region which did not reveal any signs of osteomyelitis. With that being said the pelvis in particular was not evaluated distinctly which means he could still have some  osteonecrosis I. Nonetheless the Wound VAC was started on Thursday I do want to get this little bit more time before jumping to a CT scan of the pelvis although that is something that I might would recommend if were not see an improvement by that time. 06/14/18 on evaluation today patient actually appears to be doing about the same in regard to his right gluteal ulcer. Again he did have a CT scan of the lumbar spine unfortunately this did not include the pelvis. Nonetheless with the depth of the wound that I'm seeing today even despite the fact that I'm not seeing any evidence of overt cellulitis I believe there's a good chance that we may be dealing with osteomyelitis somewhere in the right Ischial region. No fevers, chills, nausea, or vomiting noted at this time. 06/21/18 on evaluation today patient actually appears to be doing about the same with regard to his wound. The tunnel at 6 o'clock really does not appear to be any deeper although it is a little bit wider. I think at this point you may want to start packing this with white phone. Unfortunately I have not got approval for the CT scan of the pelvis as of yet due to the fact that Medicare apparently has been denied it due to the diagnosis codes not being appropriate according to Medicare for the test requested. With that being said the patient cannot have an MRI and therefore this is the only option that we have as far as testing is concerned. The patient has had infection and was on antibiotics and been added code for cellulitis of the bottom to see if this will be appropriate for getting the test approved. Nonetheless I'm concerned about the infection have been spread deeper into the Ischial region. 06/28/18 on evaluation today patient actually appears to be doing  rather well all things considered in regard to the right gluteal ulcer. He has been tolerating the dressing changes without complication. With that being said the Wound VAC he  states does have to be replaced almost every day or at least reinforced unfortunately. Patient actually has his CT scan later this morning we should have the results by tomorrow. 07/05/18 on evaluation today patient presents for follow-up concerning his right Ischial ulcer. He did see the surgeon Dr. Lysle Pearl last week. They were actually very happy with him and felt like he spent a tremendous amount of time with them as far as discussing his situation was concerned. In the end Dr. Lysle Pearl did contact me as well and determine that he would not recommend any surgical intervention at this point as he felt like it would not be in the patient's best interest based on what he was seeing. He recommended a referral to infectious disease. Subsequently this is something that Dr. Ines Bloomer office is working on setting up for the patient. As far as evaluation today is concerned the patient's wound actually appears to be worse at this point. I am concerned about how things are progressing and specifically about infection. I do not feel like it's the deeper but the area of depth is definitely widening which does have me concerned. No fevers, chills, nausea, or vomiting noted at this time. I think that we do need initiate antibiotic therapy the patient has an allow allergy to amoxicillin/penicillin he states that he gets a rash since childhood. Nonetheless she's never had the issues with Catholics or cephalosporins in general but he is aware of. 07/27/18 on evaluation today patient presents following admission to the hospital on 07/09/18. He was subsequently discharged on 07/20/18. On 07/15/18 the patient underwent irrigation and debridement was soft tissue biopsy and bone biopsy as well as placement of a Wound VAC in the OR by Dr. Celine Ahr. During the hospital course the patient was placed on a Wound VAC and recommended follow up with surgery in three weeks actually with Dr. Delaine Lame who is infectious disease. The  patient was on vancomycin during the hospital course. 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 He also had a bone culture which revealed evidence of methicillin-resistant staph aureus. He is updated CT scan 07/09/18 reveals that he had progression of the which was performed on wound to breakdown down to the trochanter where he actually had irregularities there as well suggestive of osteomyelitis. This was a change just since 9 December when we last performed a CT scan. Obviously this one had gone downhill quite significantly and rapidly. At this point upon evaluation I feel like in general the patient's wound seems to be doing fairly well all things considered upon my evaluation today. Obviously this is larger and deeper than what I previously evaluated but at the same time he seems to be making some progress as far as the appearance of the granulation tissue is concerned. I'm happy in that regard. No fevers, chills, nausea, or vomiting noted at this time. He is on IV vancomycin and Rocephin at the facility. He is currently in [facility]. 08/03/18 upon evaluation today patient's wound appears to be doing better in regard to the overall appearance at this point in time. Fortunately he's been tolerating the Wound VAC without complication and states that the facility has been taking KRISTOFOR, HUYETT. ([id number]) excellent care of the wound site. Overall I see some Slough noted on the surface which  I am going to attempt sharp debridement today of but nonetheless other than this I feel like he's making progress. 08/09/18 on evaluation today patient's wound appears to be doing much better compared to even last week's evaluation. Do believe that the Wound VAC is been of great benefit for him. He has been tolerating the dressing changes that is the Wound VAC without any complication and he has excellent granulation noted currently. There is no need for sharp  debridement at this point. 08/16/18 on evaluation today patient actually appears to be doing very well in regard to the wound in the right gluteal fold region. This is showing signs of progress and again appears to be very healthy which is excellent news. Fortunately there is no sign of active infection by way of odor or drainage at this point. Overall I'm very pleased with how things stand. He seems to be tolerating the Wound VAC without complication. 08/23/18 on evaluation today patient actually appears to be doing better in regard to his wound. He has been tolerating the Wound VAC without complication and in fact it has been collecting a significant amount of drainage which I think is good news especially considering how the wound appears. Fortunately there is no signs of infection at this time definitely nothing appears to be worse which is good news. He has not been started on the Bactrim and Flagyl that was recommended by Dr. Delaine Lame yet. I did actually contact her office this morning in order to check and see were things are that regard their gonna be calling me back. 08/30/18 on evaluation today patient actually appears to show signs of excellent improvement today compared to last evaluation. The undermining is getting much better the wound seems to be feeling quite nicely and I'm very pleased that the granulation in general. With that being said overall I feel like the patient has made excellent progress which is great news. No fevers, chills, nausea, or vomiting noted at this time. 09/06/18 on evaluation today patient actually appears to be doing rather well in regard to his right gluteal ulcer. This is showing signs of improvement in overall I'm very pleased with how things seem to be progressing. The patient likewise is please. Overall I see no evidence of infection he is about to complete his oral antibiotic regimen which is the end of the antibiotics for him in just about three  days. 09/13/18 on evaluation today patient's right Ischial ulcer appears to be showing signs of continued improvement which is excellent news. He's been tolerating the dressing changes without complication. Fortunately there's no signs of infection and the wound that seems to be doing very well. 09/28/18 on evaluation today patient appears to be doing rather well in regard to his right Ischial ulcer. He's been tolerating the Wound VAC without complication he knows there's much less drainage than there used to be this obviously is not a bad thing in my pinion. There's no evidence of infection despite the fact is but nothing about it now for several weeks. 10/04/18 on evaluation today patient appears to be doing better in regard to his right Ischial wound. He has been tolerating the Wound VAC without complication and I do believe that the silver nitrate last week was beneficial for him. Fortunately overall there's no evidence of active infection at this time which is great news. No fevers, chills, nausea, or vomiting noted at this time. 10/11/18 on evaluation today patient actually appears to be doing rather well in regard to  his Ischial ulcer. He's been tolerating the Wound VAC still without complication I feel like this is doing a good job. No fevers, chills, nausea, or vomiting noted at this time. 11/01/18 on evaluation today patient presents after having not been seen in our clinic for several weeks secondary to the fact that he was on evaluation today patient presents after having not been seen in our clinic for several weeks secondary to the fact that he was in a skilled nursing facility which was on lockdown currently due to the covert 19 national emergency. Subsequently he was discharged from the facility on this past Friday and subsequently made an appointment to come in to see yesterday. Fortunately there's no signs of active infection at this time which is good news and overall he does seem  to have made progress since I last saw. Overall I feel like things are progressing quite nicely. The patient is having no pain. 11/08/18 on evaluation today patient appears to be doing okay in regard to his right gluteal ulcer. He has been utilizing a Wound VAC home health this changing this at this point since he's home from the skilled nursing facility. Fortunately there's no signs of obvious active infection at this time. Unfortunately though there's no obvious active infection he is having some maceration and his wife states that when the sheets of the Wound VAC office on Sunday when it broke seal that he ended up having significant issues with some smell as well there concerned about the possibility of infection. Fortunately there's No fevers, chills, nausea, or vomiting noted at this time. GAMAL, ENDERBY ([id number]) 11/15/18 on evaluation today patient actually appears to be doing well in regard to his right gluteal ulcer. He has been tolerating the dressing changes without complication. Specifically the Wound VAC has been utilized up to this point. Fortunately there's no signs of infection and overall I feel like he has made progress even since last week when I last saw him. I'm actually fairly happy with the overall appearance although he does seem to have somewhat of a hyper granular overgrowth in the central portion of the wound which I think may require some sharp debridement to try flatness out possibly utilizing chemical cauterization following. 11/23/18 on evaluation today patient actually appears to be doing very well in regard to his sacral ulcer. He seems to be showing signs of improvement with good granulation. With that being said he still has the small area of hyper granulation right in the central portion of the wound which I'm gonna likely utilize silver nitrate on today. Subsequently he also keeps having a leak at the 6 o'clock location which is unfortunate we may be able  to help out with some suggestions to try to prevent this going forward. Fortunately there's no signs of active infection at this time. 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 He's been tolerating the dressing changes without complication. Fortunately there's no signs of active infection at this time. I've been rather pleased with how things have progressed there still some evidence of pressure getting to the area with some redness right around the immediate wound opening. Nonetheless other than this I'm not seeing any significant complications or issues the wound is somewhat hyper granular. Upon discussing with the patient and his wife today I'm not sure that the wound is being packed to the base with the foam at this point.  And if it's not been packed fully that may be part of the reason why is not seen as much improvement as far as the granulation from the base out. Again we do not want pack too tightly but we need some of the firm to get to the base of the wound. I discussed this with patient and his wife today. 12/06/18 on evaluation today patient appears to be doing well in regard to his right gluteal pressure ulcer. He's been tolerating the dressing changes without complication. Fortunately there's no signs of active infection. He still has some hyper granular tissue and I do think it would be appropriate to continue with the chemical cauterization as of today. 12/16/18 on evaluation today patient actually appears to be doing okay in regard to his right gluteal ulcer. He is been tolerating the dressing changes without complication including the Wound VAC. Overall I feel like nothing seems to be worsening I do feel like that the hyper granulation buds in the central portion of the wound have improved to some degree with the silver nitrate. We will have to see how things continue to  progress. 12/20/18 on evaluation today patient actually appears to be doing much worse in my pinion even compared to last week's evaluation. Unfortunately as opposed to showing any signs of improvement the areas of hyper granular tissue in the central portion of the wound seem to be getting worse. Subsequently the wound bed itself also seems to be getting deeper even compared to last week which is both unusual as well as concerning since prior he had been shown signs of improvement. Nonetheless I think that the issue could be that he's actually having some difficulty in issues with a deeper infection. There's no external signs of infection but nonetheless I am more worried about the internal, osteomyelitis, that could be restarting. He has not been on antibiotics for some time at this point. I think that it may be a good idea to go ahead and started back on an antibiotic therapy while we wait to see what the testing shows. 12/27/18 on evaluation today patient presents for follow-up concerning his left gluteal fold wound. Fortunately he appears to be doing well today. I did review the CT scan which was negative for any signs of osteomyelitis or acute abnormality this is excellent news. Overall I feel like the surface of the wound bed appears to be doing significantly better today compared to previously noted findings. There does not appear any signs of infection nor does he have any pain at this time. 01/03/19 on evaluation today patient actually appears to be doing quite well in regard to his ulcer. Post debridement last week he really did not have too much bleeding which is good news. Fortunately today this seems to be doing some better but we still has some of the hyper granular tissue noted in the base of the wound which is gonna require sharp debridement today as well. Overall I'm pleased with how things seem to be progressing since we switched away from the Wound VAC I think he is making some  progress. 01/10/19 on evaluation today patient appears to be doing better in regard to his right gluteal fold ulcer. He has been tolerating the dressing changes without complication. The debridement to seem to be helping with current away some of the poor hyper granular tissue bugs throughout the region of his gluteal fold wound. He's been tolerating the dressing changes otherwise without complication which is great news. No fevers,  chills, nausea, or vomiting noted at this time. 01/17/19 on evaluation today patient actually appears to be doing excellent in regard to his wound. He's been tolerating the dressing changes without complication. Fortunately there is no signs of active infection at this time which is great news. No fevers, chills, nausea, or vomiting noted at this time. ANGELINA, HANKO ([id number]) 01/24/19 on evaluation today patient actually appears to be doing quite well with regard to his ulcer. He has been tolerating the dressing changes without complication. Fortunately there's no signs of active infection at this time. Overall been very pleased with the progress that he seems to be making currently. 01/31/19-Patient returns at 1 week with apparent similarity in dimensions to the wound, with no signs of infection, he has been changing dressings twice a day 02/08/19 upon evaluation today patient actually appears to be doing well with regard to his right Ischial ulcer. The wound is not appear to be quite as deep and seems to be making progress which is good news. With that being said I'm still reluctant to go back to the Wound VAC at this point. He's been having to change the dressings twice a day which is a little bit much in my pinion from the wound care supplies standpoint. I think that possibly attempting to utilize extras orbit may be beneficial this may also help to prevent any additional breakdown secondary to fluid retention in the wound itself. The patient is in agreement  with giving this a try. 02/15/19 on evaluation today patient actually appears to be doing decently well with regard to his ulcer in the right to gluteal fold location. He's been tolerating the dressing changes without complication. Fortunately there is no signs of active infection at this time. He is able to keep the current dressing in place more effectively for a day at a time whereas before he was having a changes to to three times a day. The actions or has been helpful in this regard. Fortunately there's no signs of anything getting worse and I do feel like he showing signs of good improvement with regard to the wound bed status. 02/22/2019 patient appears to be doing very well today with regard to his ulcer in the gluteal fold. Fortunately there is no signs of active infection and he has been tolerating the dressing changes without any complication. Overall extremely pleased with how things seem to be progressing. He has much less of the hyper granular projections within the wound these have slowly been debrided away and he seems to be doing well. The wound bed is more uniform. 03/01/19 on evaluation today patient appears to be doing unfortunately about the same in regard to his gluteal ulcer. He's been tolerating the dressing changes without complication. Fortunately there's no signs of active infection at this time. With that being said he continues to develop these hyper granular projections which I'm unsure of exactly what they are and why they are rising. Nonetheless I explained to the patient that I do believe it would be a good idea for [place] to stand a biopsy sample for pathology to see if that can shed any light on what exactly may be going on here. Fortunately I do not see any obvious signs of infection. With that being said the patient has had a little bit more drainage this week apparently compared to last week. 03/08/2019 on evaluation today patient actually appears to look somewhat better  with regard to the appearance of his wound bed at this  time. This is good news. Overall I am very pleased with how things seem to have progressed just in the past week with a switch to the [facility] dressing. I think that has been beneficial for him. With that being said at this time the patient is concerned about his biopsy that I sent off last week unfortunately I do not have that report as of yet. Nonetheless we have called to obtain this and hopefully will hear back from the lab later this morning. 03/15/19 on evaluation today patient's wound actually appears to be doing okay today with regard to the overall appearance of the wound bed. He has been tolerating the dressing changes without complication he still has hyper granular tissue noted but fortunately that seems to be minimal at this point compared to some of what we've seen in the past. Nonetheless I do think that he is still having some issues currently with some of this type of granulation the biopsy and since all showed nothing more than just evidence of granulation tissue. Therefore there really is nothing different to initiate or do at this point. 03/24/19 on evaluation today patient appears to be doing a little better with regard to his ulcer. He's been tolerating the dressing changes without complication. Fortunately there is no signs of active infection at this time. No fevers, chills, nausea, or vomiting noted at this time. I'm overall pleased with how things seem to be progressing. 03/29/2019 on evaluation today patient appears to be doing about the same in regard to his ulcer in the right gluteal fold. Unfortunately he is not seeming to make a lot of progress and the wound is somewhat stalled. There is no signs of active infection externally but I am concerned about the possibility of infection continuing in the ischial location which previously he did have infection noted. Again is not able to have an MRI so probably our best  option for testing for this would be a triple phase bone scan which will detect subtle changes in the bone more so than plain film x-rays. In the past they really have not been beneficial and in fact the CT scans even have been somewhat questionable at times. Nonetheless there is no signs of systemic infection which is at least good news but again his wound is not healing at all on the predicted schedule. 04/05/19 on evaluation today patient appears to be doing well all things considering with regard to his wound and the right gluteal fold. He actually has his triple bone scan scheduled for sometime in the next couple of days. With that being said I've also been looking to other possibilities of what could be causing hyper granular tissue were looking into the bone scan again for evaluating for the risk or possibility of infection deeper and I'm also gonna go ahead and see about obtaining a deep tissue Mcquerry, Theadore J. ([id number]) culture today to send and see if there's any evidence of infection noted on culture. He's in agreement with that plan. 04/12/2019 on evaluation today patient presents for reevaluation here in our clinic concerning ongoing issues with his right gluteal fold ulcer. 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 It was discussed with him at that point that I think it would be appropriate for him to proceed with hyperbaric oxygen therapy especially in light of the fact that he is previously been on IV  antibiotics at the beginning of the year for close to 3 months followed by several weeks of oral antibiotics that was all prescribed by infectious disease. He had surgical debridement around Christmas December 2019 due to an abscess and osteomyelitis of the ischial bone. Unfortunately this has not really proceeded as well as we would have liked and again we did a  CT scan even a couple months ago as he cannot have an MRI secondary to having issues with both a pain pump as well as a spinal cord stimulator which prevent him from going into an MRI machine. With that being said there were chronic changes noted at the ischial tuberosity which had progressed since December 2019 there was no evidence of fluid collection on that initial CT scan. With that being said that was on January 21, 2019. I am not sure that it was a sensitive enough test as compared to an MRI and then subsequently I ordered a bone scan for him which was actually completed on 03/29/2019 and this revealed that he does indeed have positive osteomyelitis involving the right ischial tuberosity. This is adjacent to the ulcer and I think is the reason that his ulcer is not healing. Subsequently I am in a place him back on oral antibiotics today unfortunately his wound culture showed just mixed gram-negative flora with no specific findings of a predominant organism. Nonetheless being with the location I think a good broad-spectrum antibiotic for gram-negative's is a good choice at this point he is previously taken Levaquin without significant issues and I think that is an appropriate antibiotic for him at this time. 04/19/2019 on evaluation today patient actually appears to be doing fairly well with regard to his wound. He has been tolerating the dressing changes without complication. Fortunately there is no signs of active infection and he has been taking the oral antibiotics at this time. Subsequently we did make a referral to infectious disease although Dr. Steva Ready wants the patient to be seen by general surgery first in order apparently to see if there is anything from a surgical standpoint that should be done prior to initiation of IV antibiotic therapy. Again the patient is okay with actually seeing Dr. Celine Ahr whom he has seen before we will make that referral. 04/26/2019 on evaluation today patient  actually appears to be doing well with regard to his wound. He has been tolerating the dressing changes without complication. Fortunately there is no signs of active infection at this time. I do believe that the hyperbaric oxygen therapy along with the antibiotics which I prescribed at this point have been doing well for him. With that being said he has not seen Dr. Celine Ahr the surgeon yet in fact they have not been contacted for scheduling an appointment as of yet. Subsequently the patient is not on antibiotics currently by IV Dr. Steva Ready did want him to see Dr. Celine Ahr first which we are working on trying to get scheduled. We again give the information to the patient today for Dr. Celine Ahr and her number as far as contacting their office to see about getting something scheduled. Again were looking for whether or not they recommend any surgical intervention. 05/03/2019 on evaluation today patient appears to be doing about the same with regard to his wound there may be a little bit of filling in in regard to the base of the wound but still he has quite a bit of hyper granular tissue that I am seeing today. I believe that he may may  need a more aggressive sharp debridement possibly even by the surgeon or under anesthesia in order to clear away some of the hyper granular material in order to help allow for appropriate granulation to fill in. We have made a referral to Dr. Celine Ahr unfortunately it sounds as if they did not receive the referral we contacted them today they should be get in touch with the patient. Depending on how things show from that standpoint the patient may need to see Dr. Ardyth Gal who is the infectious disease specialist although he really does not want a PICC line again. No fevers, chills, nausea, vomiting, or diarrhea. He is tolerating hyperbaric oxygen therapy very well. 05/12/2019 on evaluation today patient presents for follow-up visit concerning his right gluteal fold ulcer.  He did see Dr. Celine Ahr the surgeon who previously evaluated his wound and she actually felt like he was doing quite well with regard to the wound based on what she was seen. He does seem to be responding to some degree with the oral antibiotics along with hyperbaric oxygen therapy at this point she did not see any evidence or need for further surgical intervention at this point. She recommended deferring additional or ongoing antibiotic therapy to Dr. Steva Ready at her discretion. Fortunately there is no signs of active systemic infection. No fevers, chills, nausea, vomiting, or diarrhea. 10/29; patient I am seeing really for the first time today in terms of his wound. 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 He is being treated with hyperbaric oxygen for underlying osteomyelitis most recently documented by a three-phase bone scan. He has been on Levaquin for roughly a month and he is out of these and asked me for a refill. Most recent cultures were negative although I do not think these were bone cultures. He has a very irregular surface to this wound which is almost nodular in texture. It undermines superiorly with the same nodular hyper granulated surface. I see that he was referred to general surgery for an operative debridement although the surgeon did not agree with this I think that would have been the proper course of action in this. He has been using Hydrofera Blue. He is supposed to start a wound VAC tomorrow which is actually a reapplication of wound VAC. I think mostly last time they had trouble keeping the seal in place I hope we have better look at this this time. TYRONE, MESMER ([id number]) 05/24/2019 on evaluation today I did review patient's note from Dr. Dellia Nims where he was seen last week when I was out of the office. Subsequently it does appear that Dr. Dellia Nims was in agreement that the patient really needed debridement to clear away some of the  nodular tissue in the base of the wound. The good news is he has the wound VAC initiated and some of this tissue is already starting to breakdown under the treatment of the wound VAC again because it is not really viable. Nonetheless I do believe that we can likely perform some debridement today to clear this away and hopefully the continuation of the wound VAC along with hyperbarics and antibiotic therapy will be beneficial in stopping this from reoccurring. If we get better control in the wound bed in a good spot I think we can definitely use the PriMatrix which we actually did get approval for. 05/31/2019 on evaluation today patient appears to be doing actually pretty well at this point with regard to his wound. He has been tolerating  the dressing changes which is good news. Fortunately there is no signs of infection. He still has some of the hyper granular nodules noted we have not really cleared all these away and I think we can try to do a little bit more that today. I did do quite a bit last week I am hoping to get down to the base of the wound so though actually be able to heal more appropriately. I think the wound VAC is doing a good job right now with controlling the drainage and overall the patient is happy with that. 06/07/2019 upon evaluation today patient appears to be doing quite well compared to last week with regard to the hyper granulation in the base of the wound. He has been tolerating the dressing changes without complication. Fortunately there is no signs of active infection at this time. With that being said we have not had any recent measures as far as blood work is concerned I think we may want to check a few things including a CBC, sed rate, and C-reactive protein. Patient is in agreement with that we can try to get that scheduled for him for this Friday. 06/13/2019 on evaluation today patient actually appears to be doing much better at this point based on what I am seeing  with regard to his wound. He has much less in the way of hyper granular tissue which is good news and a lot more healing that is taken place since I last saw him as far as the amount of space within the wound itself. With that being said he is nearing the completion of the initial 40 treatments for hyperbaric oxygen therapy I think this is something I would recommend extending as he does seem to be benefiting at this time. Fortunately there is no evidence of active systemic infection at this point nor even local infection for that matter. 06/20/2019 upon evaluation today patient actually appears to be doing well with regard to his wound. In fact this appears to be doing much better today compared to what it was previous. I been extremely pleased with the progress that he is made. In fact the tunnel is much less deep today than what it was in the past and I am seeing excellent signs of improvement. Overall I am happy with how things are going. I did review his white blood cell count which revealed everything to be okay. The one abnormal finding on his CBC was a hemoglobin of 12.7. Subsequently I did also review his sed rate and C-reactive protein. His sed rate measured in at normal level measuring at 26. The C-reactive protein however was elevated today and an abnormal range indicating inflammation. Still I feel like he is trending in a good direction at this time. He is going to need a refill of the Levaquin he tells me at this point. 07/04/2019 on evaluation today patient actually appears to be doing well in regard to his wound. He has been tolerating the dressing changes without complication. Fortunately there is no signs of active infection which is good news. No fevers, chills, nausea, vomiting, or diarrhea. The good news also is that I did review his labs and though his sed rate was slightly elevated the C-reactive protein was actually down compared to the last evaluation 2 weeks ago this is  excellent news I think it is showing signs of improvement. 07/11/2019 on evaluation today patient actually appears to be doing quite well with regard to his wound on my evaluation.  This is very slow going but nonetheless he seems to be making progress. Especially in regard to the depth. Nonetheless I believe that we may want to consider going forward with the PriMatrix which was previously approved for him. We will get a have to get a new approval as we will actually be applying this in the new year so we will likely obtain that approval on January 4. In the meantime and for the time being my suggestion is going to be that we go ahead and continue with the wound VAC and we were going at this point with the current wound care measures. 12/28-Patient seen after HBO session today, wound depth with the tunnel measures 3.2 cm, the wound bed appears healthy, continuing with the VAC and HBO 07/25/2019 on evaluation today patient's wound appears to be doing about the same compared to my last evaluation. Fortunately there is no evidence of significant infection which is good news we will still waiting on the results of his lab work which unfortunately was not done prior to today's visit which is what we were aiming for. Ultimately there is no sign of active infection at this time which is good news. And he also tells me that the wound VAC has been staying in place and doing fairly well which is also good news. With that being said I am concerned about the fact that the wound has not progressed as much DAIMON, SARTORIUS. ([id number]) I think we may want to look into getting reapproval for the skin substitute, specifically PriMatrix, that I am hopeful will help to allow this area to granulate in more effectively. I would recommend as well continuing with the wound VAC along with this. 08/01/2019 on evaluation today patient appears to be doing in my opinion slightly better in regard to the wound. He  actually does have some tightening of the walls around the edges which is a good thing. Fortunately there is no signs of active infection at this time. No fever chills noted. I do believe that the patient in general seems to be making progress although is very slow. Obviously were trying to get things to speed up somewhat more. No fevers, chills, nausea, vomiting, or diarrhea. Objective Constitutional Well-nourished and well-hydrated in no acute distress. Vitals Time Taken: 8:14 AM, Height: 73 in, Weight: 210 lbs, BMI: 27.7, Temperature: 98.7 F, Pulse: 82 bpm, Respiratory Rate: 16 breaths/min, Blood Pressure: 133/79 mmHg. Respiratory normal breathing without difficulty. Psychiatric this patient is able to make decisions and demonstrates good insight into disease process. Alert and Oriented x 3. pleasant and cooperative. General Notes: Upon inspection today patient's wound bed showed signs of fairly good granulation though there was some area where he did have some of the hyper granular viable but poor quality tissue noted in a portion of the wound. This actually did require sharp debridement was an area that was roughly 2 x 2 cm that was debrided away. Silver nitrate was used over the area post debridement to help with hemostasis. Patient tolerated this without any pain. Integumentary (Hair, Skin) Wound #1 status is Open. Original cause of wound was Pressure Injury. The wound is located on the Right Gluteal fold. The wound measures 2.5cm length x 1.2cm width x 2.9cm depth; 2.356cm^2 area and 6.833cm^3 volume. There is muscle and Fat Layer (Subcutaneous Tissue) Exposed exposed. There is no undermining noted, however, there is tunneling at 12:00 with a maximum distance of 4cm. There is a large amount of serous drainage noted. The  wound margin is epibole. There is large (67-100%) pink, hyper - granulation within the wound bed. There is a small (1-33%) amount of necrotic tissue within the wound  bed including Adherent Slough. General Notes: Maceration around edges. Assessment Active Problems ICD-10 Pressure ulcer of right buttock, stage 4 Other chronic osteomyelitis, other site Cellulitis of buttock Min, Blake J. 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 There was a Excisional Skin/Subcutaneous Tissue Debridement with a total area of 4 sq cm performed by [name], Cordie Buening E., [name]. With the following instrument(s): Curette to remove Viable and Non-Viable tissue/material. Material removed includes Subcutaneous Tissue and Hyper-granulation and after achieving pain control using Lidocaine. No specimens were taken. A time out was conducted at 08:38, prior to the start of the procedure. A Large amount of bleeding was controlled with Silver Nitrate. The procedure was tolerated well. Post Debridement Measurements: 2.5cm length x 1.2cm width x 3.1cm depth; 7.304cm^3 volume. Post debridement Stage noted as Category/Stage IV. Character of Wound/Ulcer Post Debridement is stable. Post procedure Diagnosis Wound #1: Same as Pre-Procedure Plan Wound Cleansing: Wound #1 Right Gluteal fold: Clean wound with Normal Saline. - in office Cleanse wound with mild soap and water Skin Barriers/Peri-Wound Care: Skin Prep Primary Wound Dressing: Wound #1 Right Gluteal fold: Silver Collagen - under NPWT Dressing Change Frequency: Wound #1 Right Gluteal fold: Change Dressing Monday, Wednesday, Friday Follow-up Appointments: Wound #1 Right Gluteal fold: Return Appointment in 1 week. Off-Loading: Wound #1 Right Gluteal fold: Turn and reposition every 2 hours - no pressure on wounded area Home Health: Wound #1 Right Gluteal fold: Continue Home Health Visits - [place]  Nurse may visit PRN to address patient s wound care needs. FACE TO FACE ENCOUNTER: MEDICARE and MEDICAID PATIENTS: I certify that this patient is under my care and that I had a face-to-face encounter that meets the physician face-to-face encounter requirements with this patient on this date. The encounter with the patient was in whole or in part for the following MEDICAL CONDITION: (primary reason for [place]) MEDICAL NECESSITY: I certify, that based on my findings, NURSING services are a medically necessary home health service. HOME BOUND STATUS: I certify that my clinical findings support that this patient is homebound (i.e., Due to illness or injury, pt requires aid of supportive devices such as crutches, cane, wheelchairs, walkers, the use of special transportation or the assistance of another person to leave their place of residence. There is a normal inability to leave the CASTLE, DOUGALL. ([id number]) home and doing so requires considerable and taxing effort. Other absences are for medical reasons / religious services and are infrequent or of short duration when for other reasons). If current dressing causes regression in wound condition, may D/C ordered dressing product/s and apply Normal Saline Moist Dressing daily until next [place] / Other MD appointment. [place] of regression in wound condition at [phone]. Please direct any NON-WOUND related issues/requests for orders to patient's Primary Care Physician Negative Pressure Wound Therapy: Wound #1 Right Gluteal fold: Wound VAC settings at 125/130 mmHg continuous pressure. Use BLACK/GREEN foam to wound cavity. Use WHITE foam to fill any tunnel/s and/or undermining. Change VAC dressing 3 X WEEK. Change canister as indicated when full. Nurse may titrate settings and frequency of dressing changes as clinically indicated. Home Health Nurse  may d/c VAC for s/s of increased infection, significant  wound regression, or uncontrolled drainage. [place] at [phone]. Apply contact layer over base of wound. - apply prisma/silver collagen to the base of the wound Number of foam/gauze pieces used in the dressing = General Notes: Order Primatrix for next visit 1. My suggestion at this time is good to be that we go ahead and continue with the current wound care measures specifically with regard to wound VAC which I think has been appropriate for him I would recommend that we continue with such. 2. I am also going to recommend that we continue with appropriate offloading that something that we discussed frequently he tells me he spends most of his day at this point laying in the bed on his side. 3. I am also going to suggest that we go ahead and order the PriMatrix for him which I am hopeful will be beneficial with regard to helping the area to phalangeal heel more effectively and quickly. We will see patient back for reevaluation in 1 week here in the clinic. If anything worsens or changes patient will contact our office for additional recommendations. Electronic Signature(s) Signed: 08/01/2019 9:07:07 AM By: Worthy Keeler [name] Entered By: Worthy Keeler on 08/01/2019 09:07:07 EMEKA, ROUCH ([id number]) -------------------------------------------------------------------------------- SuperBill Details Patient Name: [name] Date of Service: 08/01/2019 Medical Record Number: [id number] Patient Account Number: [id number] Date of Birth/Sex: 1951/04/13 (68 y.o. 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

## 2019-08-08 ENCOUNTER — Other Ambulatory Visit: Payer: Self-pay

## 2019-08-08 ENCOUNTER — Encounter: Payer: Medicare Other | Admitting: Physician Assistant

## 2019-08-08 DIAGNOSIS — L89314 Pressure ulcer of right buttock, stage 4: Secondary | ICD-10-CM | POA: Diagnosis not present

## 2019-08-08 NOTE — Progress Notes (Addendum)
KEYWAN, REAU ([id number]) Visit Report for 08/08/2019 Chief Complaint Document Details Patient Name: [name], [name] Date of Service: 08/08/2019 8:15 AM Medical Record Number: [id number] Patient Account Number: [id number] Date of Birth/Sex: Apr 27, 1951 (69 y.o. M) Treating RN: [name] Primary Care Provider: [name] Other Clinician: Referring Provider: [name] Treating Provider/Extender: [name], Annalina Needles Weeks in Treatment: 22 Information Obtained from: Patient Chief Complaint Right gluteal fold ulcer Electronic Signature(s) Signed: 08/08/2019 8:24:13 AM By: Worthy Keeler [name] Entered By: Worthy Keeler on 08/08/2019 08:24:13 [name], [name] ([id number]) -------------------------------------------------------------------------------- Cellular or Tissue Based Product Details Patient Name: [name] Date of Service: 08/08/2019 8:15 AM Medical Record Number: [id number] Patient Account Number: [id number] Date of Birth/Sex: 01-15-1951 (68 y.o. M) Treating RN: [name] Primary Care Provider: [name] Other Clinician: Referring Provider: [name] Treating Provider/Extender: [name], Tyquisha Sharps Weeks in Treatment: 97 Cellular or Tissue Based Wound #1 Right Gluteal fold Product Type Applied to: Performed By: Physician [name], Mesha Schamberger E., [name] Cellular or Tissue Based Primatrix Product Type: Level of Consciousness (Pre- Awake and Alert procedure): Pre-procedure Verification/Time Yes - 08:45 Out Taken: Location: trunk / arms / legs Wound Size (sq cm): 4.16 Product Size (sq cm): 4 Waste Size (sq cm): 0 Amount of Product Applied (sq cm): 4 Lot #: [id number] Expiration Date: 09/18/2023 Fenestrated: Yes Instrument: Mesher Reconstituted: Yes Solution Type: nacl Solution Amount: 70mL Lot #: [id number] Solution Expiration Date: 11/18/2020 Secured: Yes Secured With: Sutured Dressing Applied: Yes Primary Dressing: Mepitel Response to  Treatment: Procedure was tolerated well Level of Consciousness (Post- Awake and Alert procedure): Post Procedure Diagnosis Same as Pre-procedure Electronic Signature(s) Signed: 08/08/2019 5:04:31 PM By: Gretta Cool, BSN, RN, CWS, Kim RN, BSN Entered By: Gretta Cool, BSN, RN, CWS, Kim on 08/08/2019 08:49:50 [name], [name] ([id number]) -------------------------------------------------------------------------------- HPI Details Patient Name: [name] Date of Service: 08/08/2019 8:15 AM Medical Record Number: [id number] Patient Account Number: [id number] Date of Birth/Sex: 15-Aug-1950 (68 y.o. 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 He states that there is no history of diabetes that he is aware of. He does have issues with his prostate and is currently receiving treatment for this by way of oral medication. With that being said I do not have a lot of details in that regard. Nonetheless the patient presents today as a result of having been referred to [place] by another provider initially home health was set to come out and take care of his wound although due to the fact that he apparently drives he's not able to receive home health. His wife is therefore trying to help take care of this wound within although they have been struggling with what exactly to do at this point. She states that she can do some things but she is definitely not a nurse and does have some issues with looking at blood. The good news is the wound does not appear to be too deep and  is fairly superficial at this point. There is no slough noted there is some nonviable skin noted around the surface of the wound and the perimeter at  this point. The central portion of the wound appears to be very good with a dermal layer noted this does not appear to be again deep enough to extend it to subcutaneous tissue at this point. 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 In the pump he has Dilaudid and baclofen. 12/07/17 on evaluation today patient presents for follow-up concerning his ongoing lower back thigh ulcer on the right. He states that he did not get the supplies ordered and therefore has not really been able to perform the dressing changes as directed exactly. His wife was able to get some Boarder Foam Dressing's from the drugstore and subsequently has been using hydrogel which did help to a degree in the wound does appear to be able smaller. There is actually more drainage this week noted than previous. 12/21/17 on evaluation today patient appears to be doing rather well in regard to his right gluteal ulcer. He has been tolerating the dressing changes without complication. There does not appear to be any evidence of infection at this point in time. Overall the wound does seem to be making some progress as far as the edges are concerned there's not as much in the way of overlapping of the external wound edges and he has a good epithelium to wound bed border for the most part. This however is not true right at the 12 o'clock location over the span of a little over a centimeters which actually will require debridement today to clean this away and hopefully allow it to continue to heal more appropriately. 12/28/17 on evaluation today patient appears to be doing rather well in regard to his ulcer in the left gluteal region. He's been tolerating the dressing changes without  complication. Apparently he has had some difficulty getting his dressing material. Apparently there's been some confusion with ordering we're gonna check into this. Nonetheless overall he's been showing signs of improvement which is good news. Debridement is not required today. 01/04/18 on evaluation today patient presents for follow-up concerning his right gluteal ulcer. He has been tolerating the dressing changes fairly well. On inspection today it appears he may actually have some maceration them concerned about the fact that he may be developing too much moisture in and around the wound bed which can cause delay in healing. With that being said he unfortunately really has not showed significant signs of improvement since last week's evaluation in fact this may even be just the little bit/slightly larger. Nonetheless he's been having a lot of discomfort I'm not sure this is even related to the wound as he has no pain when I'm to breeding or otherwise cleaning the wound during evaluation today. Nonetheless this is something that we did recommend he talked to his pain specialist concerning. 01/11/18 on evaluation today patient appears to be doing better in regard to his ulceration. He has been tolerating the dressing changes without complication. With that being said overall there's no evidence of infection which is good news. The only thing is he did receive the hatch affair blue classic versus the ready nonetheless I feel like this is perfectly fine and appears to have done well for him over the past week. 01/25/18 on evaluation today patient's wound actually appears to be a little bit larger than during the last evaluation. The good [name], [name]. ([id number]) news is the majority of the wound edges actually appear to be  fairly firmly attached to the wound bed unfortunately again we're not really making progress in regard to the size. Roughly the wound is about the same size as when I first saw  him although again the wound margin/edges appear to be much better. 02/01/18 on evaluation today patient actually appears to be doing very well in regard to his wound. Applying the Prisma dry does seem to be better although he does still have issues with slow progression of the wound. There was a slight improvement compared to last week's measurements today. Nonetheless I have been considering other options as far as the possibility of Theraskin or even a snap vac. In general I'm not sure that the Theraskin due to location of the wound would be a very good idea. Nonetheless I do think that a snap vac could be a possibility for the patient and in fact I think this could even be an excellent way to manage the wound possibly seeing some improvement in a very rapid fashion here. Nonetheless this is something that we would need to get approved and I did have a lengthy conversation with the patient about this today. 02/08/18 on evaluation today patient appears to be doing a little better in regard to his ulcer. He has been tolerating the dressing changes without complication. Fortunately despite the fact that the wound is a little bit smaller it's not significantly so unfortunately. We have discussed the possibility of a snap vac we did check with insurance this is actually covered at this point. Fortunately there does not appear to be any sign of infection. Overall I'm fairly pleased with how things seem to be appearing at this point. 02/15/18 on evaluation today patient appears to be doing rather well in regard to his right gluteal ulcer. Unfortunately the snap vac did not stay in place with his sheer and friction this came loose and did not seem to maintain seal very well. He worked for about two days and it did seem to do very well during that time according to his wife but in general this does not seem to be something that's gonna be beneficial for him long-term. I do believe we need to go back to  standard dressings to see if we can find something that will be of benefit. 03/02/18- He is here in follow up evaluation; there is minimal change in the wound. He will continue with the same treatment plan, would consider changing to iodosrob/iodoflex if ulcer continues to to plateau. He will follow up next week 03/08/18 on evaluation today patient's wound actually appears to be about the same size as when I previously saw him several weeks back. Unfortunately he does have some slightly dark discoloration in the central portion of the wound which has me concerned about pressure injury. I do believe he may be sitting for too long a period of time in fact he tells me that "I probably sit for much too long". He does have some Slough noted on the surface of the wound and again as far as the size of the wound is concerned I'm really not seeing anything that seems to have improved significantly. 03/15/18 on evaluation today patient appears to be doing fairly well in regard to his ulcer. The wound measured pretty much about the same today compared to last week's evaluation when looking at his graph. With that being said the area of bruising/deep tissue injury that was noted last week I do not see at this point. He did get a  new cushion fortunately this does seem to be have been of benefit in my pinion. It does appear that he's been off of this more which is good news as well I think that is definitely showing in the overall wound measurements. With that being said I do believe that he needs to continue to offload I don't think that the fact this is doing better should be or is going to allow him to not have to offload and explain this to him as well. Overall he seems to be in agreement the plan I think he understands. The overall appearance of the wound bed is improved compared to last week I think the Iodoflex has been beneficial in that regard. 03/29/18 on evaluation today patient actually appears to be doing  rather well in regard to his wound from the overall appearance standpoint he does have some granulation although there's some Slough on the surface of the wound noted as well. With that being said he unfortunately has not improved in regard to the overall measurement of the wound in volume or in size. I did have a discussion with him very specifically about offloading today. He actually does work although he mainly is just sitting throughout the day. He tells me he offloads by "lifting himself up for 30 seconds off of his chair occasionally" purchase from [employer] which does seem to have helped. And he has a new cushion that he with that being said he's also able to stand some for a very short period of time but not significant enough I think to provide appropriate offloading. I think the biggest issue at this point with the wound and the fact is not healing as quickly as we would like is due to the fact that he is really not able to appropriately offload while at work. He states the beginning after his injury he actually had a bed at his job that he could lay on in order to offload and that does seem to have been of help back at that time. Nonetheless he had not done this in quite some time unfortunately. I think that could be helpful for him this is something I would like for him to look into. 04/05/18 on evaluation today patient actually presents for follow-up concerning his right gluteal ulcer. Again he really is not significantly improved even compared to last week. He has been tolerating the dressing changes without complication. With that being said fortunately there appears to be no evidence of infection at this time. He has been more proactive in trying to offload. [name], [name] ([id number]) 04/12/18 on evaluation today patient actually appears to be doing a little better in regard to his wound and the right gluteal fold region. He's been tolerating the dressing changes since  removing the oasis without complication. However he was having a lot of burning initially with the oasis in place. He's unsure of exactly why this was given so much discomfort but he assumes that it was the oasis itself causing the problem. Nonetheless this had to be removed after about three days in place although even those three days seem to have made a fairly good improvement in regard to the overall appearance of the wound bed. In fact is the first time that he's made any improvement from the standpoint of measurements in about six weeks. He continues to have no discomfort over the area of the wound itself which leads me to wonder why he was having the burning with the oasis  when he does not even feel the actual debridement's themselves. I am somewhat perplexed by this. 04/19/18 on evaluation today patient's wound actually appears to be showing signs of epithelialization around the edge of the wound and in general actually appears to be doing better which is good news. He did have the same burning after about three days with applying the Endoform last week in the same fashion that I would generally apply a skin substitute. This seems to indicate that it's not the oasis to cause the problem but potentially the moisture buildup that just causes things to burn or there may be some other reaction with the skin prep or Steri-Strips. Nonetheless I'm not sure that is gonna be able to tolerate any skin substitute for a long period of time. The good news is the wound actually appears to be doing better today compared to last week and does seem to finally be making some progress. 04/26/18 on evaluation today patient actually appears to be doing rather well in regard to his ulcer in the right gluteal fold. He has been tolerating the dressing changes without complication which is good news. The Endoform does seem to be helping although he was a little bit more macerated this week. This seems to be an ongoing  issue with fluid control at this point. Nonetheless I think we may be able to add something like Drawtex to help control the drainage. 05/03/18 on evaluation today patient appears to actually be doing better in regard to the overall appearance of his wound. He has been tolerating the dressing changes without complication. Fortunately there appears to be no evidence of infection at this time. I really feel like his wound has shown signs as of today of turning around last week I thought so as well and definitely he could be seen in this week's overall appearance and measurements. In general I'm very pleased with the fact that he finally seems to be making a steady but sure progress. The patient likewise is very pleased. 05/17/18 on evaluation today patient appears to be doing more poorly unfortunately in regard to his ulcer. He has been tolerating the dressing changes without complication. With that being said he tells me that in the past couple of days he and his wife have noticed that we did not seem to be doing quite as well is getting dark near the center. Subsequently upon evaluation today the wound actually does appear to be doing worse compared to previous. He has been tolerating the dressing changes otherwise and he states that he is not been sitting up anymore than he was in the past from what he tells me. Still he has continued to work he states "I'm tired of dealing with this and if I have to just go home and lay in the bed all the time that's what I'll do". Nonetheless I am concerned about the fact that this wound does appear to be deeper than what it was previous. 05/24/18 upon evaluation today patient actually presents after having been in the hospital due to what was presumed to be sepsis secondary to the wound infection. He had an elevated white blood cell count between 14 and 15. With that being said he does seem to be doing somewhat better now. His wound still is giving him some  trouble nonetheless and he is obviously concerned about the fact likely talked about that this does seem to go more deeply than previously noted. I did review his wound culture which showed evidence  of Staphylococcus aureus him and group B strep. Nonetheless he is on antibiotics, Levaquin, for this. Subsequently I did review his intake summary from the hospital as well. I also did look at the CT of the lumbar spine with contrast that was performed which showed no bone destruction to suggest lumbar disguises/osteomyelitis or sacral osteomyelitis. There was no paraspinal abscess. Nonetheless it appears this may have been more of just a soft tissue infection at this point which is good news. He still is nonetheless concerned about the wound which again I think is completely reasonable considering everything he's been through recently. 05/31/18 on evaluation today on evaluation today patient actually appears to be showing signs of his wound be a little bit deeper than what I would like to see. Fortunately he does not show any signs of significant infection although his temperature was 99 today he states he's been checking this at home and has not been elevated. Nonetheless with the undermining that I'm seeing at this point I am becoming more concerned about the wound I do think that offloading is a key factor here that is preventing the speedy recovery at this point. There does not appear to be any evidence of again over infection noted. He's been using Santyl currently. 06/07/18 the patient presents today for follow-up evaluation regarding the left ulcer in the gluteal region. He has been tolerating the Wound VAC fairly well. He is obviously very frustrated with this he states that to mean is really getting in his way. There does not appear to be any evidence of infection at this time he does have a little bit of odor I do not necessarily associate this with infection just something that we sometimes  notice with Wound VAC therapy. With that being said I can definitely catch a tone of discontentment overall in the patient's demeanor today. This when he was previously in the hospital [name], [name]. ([id number]) an CT scan was done of the lumbar region which did not reveal any signs of osteomyelitis. With that being said the pelvis in particular was not evaluated distinctly which means he could still have some osteonecrosis I. Nonetheless the Wound VAC was started on Thursday I do want to get this little bit more time before jumping to a CT scan of the pelvis although that is something that I might would recommend if were not see an improvement by that time. 06/14/18 on evaluation today patient actually appears to be doing about the same in regard to his right gluteal ulcer. Again he did have a CT scan of the lumbar spine unfortunately this did not include the pelvis. Nonetheless with the depth of the wound that I'm seeing today even despite the fact that I'm not seeing any evidence of overt cellulitis I believe there's a good chance that we may be dealing with osteomyelitis somewhere in the right Ischial region. No fevers, chills, nausea, or vomiting noted at this time. 06/21/18 on evaluation today patient actually appears to be doing about the same with regard to his wound. The tunnel at 6 o'clock really does not appear to be any deeper although it is a little bit wider. I think at this point you may want to start packing this with white phone. Unfortunately I have not got approval for the CT scan of the pelvis as of yet due to the fact that Medicare apparently has been denied it due to the diagnosis codes not being appropriate according to Medicare for the test requested. With that  being said the patient cannot have an MRI and therefore this is the only option that we have as far as testing is concerned. The patient has had infection and was on antibiotics and been added code for cellulitis  of the bottom to see if this will be appropriate for getting the test approved. Nonetheless I'm concerned about the infection have been spread deeper into the Ischial region. 06/28/18 on evaluation today patient actually appears to be doing rather well all things considered in regard to the right gluteal ulcer. He has been tolerating the dressing changes without complication. With that being said the Wound VAC he states does have to be replaced almost every day or at least reinforced unfortunately. Patient actually has his CT scan later this morning we should have the results by tomorrow. 07/05/18 on evaluation today patient presents for follow-up concerning his right Ischial ulcer. He did see the surgeon Dr. Lysle Pearl last week. They were actually very happy with him and felt like he spent a tremendous amount of time with them as far as discussing his situation was concerned. In the end Dr. Lysle Pearl did contact me as well and determine that he would not recommend any surgical intervention at this point as he felt like it would not be in the patient's best interest based on what he was seeing. He recommended a referral to infectious disease. Subsequently this is something that Dr. Ines Bloomer office is working on setting up for the patient. As far as evaluation today is concerned the patient's wound actually appears to be worse at this point. I am concerned about how things are progressing and specifically about infection. I do not feel like it's the deeper but the area of depth is definitely widening which does have me concerned. No fevers, chills, nausea, or vomiting noted at this time. I think that we do need initiate antibiotic therapy the patient has an allow allergy to amoxicillin/penicillin he states that he gets a rash since childhood. Nonetheless she's never had the issues with [employer] or cephalosporins in general but he is aware of. 07/27/18 on evaluation today patient presents following admission to  the hospital on 07/09/18. He was subsequently discharged on 07/20/18. On 07/15/18 the patient underwent irrigation and debridement was soft tissue biopsy and bone biopsy as well as placement of a Wound VAC in the OR by Dr. Celine Ahr. During the hospital course the patient was placed on a Wound VAC and recommended follow up with surgery in three weeks actually with Dr. Delaine Lame who is infectious disease. The patient was on vancomycin during the hospital course. 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 He also had a bone culture which revealed evidence of methicillin-resistant staph aureus. He is updated CT scan 07/09/18 reveals that he had progression of the which was performed on wound to breakdown down to the trochanter where he actually had irregularities there as well suggestive of osteomyelitis. This was a change just since 9 December when we last performed a CT scan. Obviously this one had gone downhill quite significantly and rapidly. At this point upon evaluation I feel like in general the patient's wound seems to be doing fairly well all things considered upon my evaluation today. Obviously this is larger and deeper than what I previously evaluated but at the same time he seems to be making some progress as far as the appearance of the granulation tissue is concerned. I'm happy in that regard. No fevers, chills, nausea, or vomiting noted  at this time. He is on IV vancomycin and Rocephin at the facility. He is currently in [facility]. 08/03/18 upon evaluation today patient's wound appears to be doing better in regard to the overall appearance at this point in time. Fortunately he's been tolerating the Wound VAC without complication and states that the facility has been taking excellent care of the wound site. Overall I see some Slough noted on the surface which I am going to attempt sharp debridement today of but nonetheless other than this I feel  like he's making progress. 08/09/18 on evaluation today patient's wound appears to be doing much better compared to even last week's evaluation. Do believe that the Wound VAC is been of great benefit for him. He has been tolerating the dressing changes that is the Wound ADONIJAH, KUBLY. ([id number]) VAC without any complication and he has excellent granulation noted currently. There is no need for sharp debridement at this point. 08/16/18 on evaluation today patient actually appears to be doing very well in regard to the wound in the right gluteal fold region. This is showing signs of progress and again appears to be very healthy which is excellent news. Fortunately there is no sign of active infection by way of odor or drainage at this point. Overall I'm very pleased with how things stand. He seems to be tolerating the Wound VAC without complication. 08/23/18 on evaluation today patient actually appears to be doing better in regard to his wound. He has been tolerating the Wound VAC without complication and in fact it has been collecting a significant amount of drainage which I think is good news especially considering how the wound appears. Fortunately there is no signs of infection at this time definitely nothing appears to be worse which is good news. He has not been started on the Bactrim and Flagyl that was recommended by Dr. Delaine Lame yet. I did actually contact her office this morning in order to check and see were things are that regard their gonna be calling me back. 08/30/18 on evaluation today patient actually appears to show signs of excellent improvement today compared to last evaluation. The undermining is getting much better the wound seems to be feeling quite nicely and I'm very pleased that the granulation in general. With that being said overall I feel like the patient has made excellent progress which is great news. No fevers, chills, nausea, or vomiting noted at this  time. 09/06/18 on evaluation today patient actually appears to be doing rather well in regard to his right gluteal ulcer. This is showing signs of improvement in overall I'm very pleased with how things seem to be progressing. The patient likewise is please. Overall I see no evidence of infection he is about to complete his oral antibiotic regimen which is the end of the antibiotics for him in just about three days. 09/13/18 on evaluation today patient's right Ischial ulcer appears to be showing signs of continued improvement which is excellent news. He's been tolerating the dressing changes without complication. Fortunately there's no signs of infection and the wound that seems to be doing very well. 09/28/18 on evaluation today patient appears to be doing rather well in regard to his right Ischial ulcer. He's been tolerating the Wound VAC without complication he knows there's much less drainage than there used to be this obviously is not a bad thing in my pinion. There's no evidence of infection despite the fact is but nothing about it now for several weeks. 10/04/18 on  evaluation today patient appears to be doing better in regard to his right Ischial wound. He has been tolerating the Wound VAC without complication and I do believe that the silver nitrate last week was beneficial for him. Fortunately overall there's no evidence of active infection at this time which is great news. No fevers, chills, nausea, or vomiting noted at this time. 10/11/18 on evaluation today patient actually appears to be doing rather well in regard to his Ischial ulcer. He's been tolerating the Wound VAC still without complication I feel like this is doing a good job. No fevers, chills, nausea, or vomiting noted at this time. 11/01/18 on evaluation today patient presents after having not been seen in our clinic for several weeks secondary to the fact that he was on evaluation today patient presents after having not been seen  in our clinic for several weeks secondary to the fact that he was in a skilled nursing facility which was on lockdown currently due to the covert 19 national emergency. Subsequently he was discharged from the facility on this past Friday and subsequently made an appointment to come in to see yesterday. Fortunately there's no signs of active infection at this time which is good news and overall he does seem to have made progress since I last saw. Overall I feel like things are progressing quite nicely. The patient is having no pain. 11/08/18 on evaluation today patient appears to be doing okay in regard to his right gluteal ulcer. He has been utilizing a Wound VAC home health this changing this at this point since he's home from the skilled nursing facility. Fortunately there's no signs of obvious active infection at this time. Unfortunately though there's no obvious active infection he is having some maceration and his wife states that when the sheets of the Wound VAC office on Sunday when it broke seal that he ended up having significant issues with some smell as well there concerned about the possibility of infection. Fortunately there's No fevers, chills, nausea, or vomiting noted at this time. 11/15/18 on evaluation today patient actually appears to be doing well in regard to his right gluteal ulcer. He has been tolerating the dressing changes without complication. Specifically the Wound VAC has been utilized up to this point. Fortunately there's no signs of infection and overall I feel like he has made progress even since last week when I last saw him. I'm actually fairly happy with the overall appearance although he does seem to have somewhat of a hyper granular Dicenso, Stanislaw J. ([id number]) overgrowth in the central portion of the wound which I think may require some sharp debridement to try flatness out possibly utilizing chemical cauterization following. 11/23/18 on evaluation today patient  actually appears to be doing very well in regard to his sacral ulcer. He seems to be showing signs of improvement with good granulation. With that being said he still has the small area of hyper granulation right in the central portion of the wound which I'm gonna likely utilize silver nitrate on today. Subsequently he also keeps having a leak at the 6 o'clock location which is unfortunate we may be able to help out with some suggestions to try to prevent this going forward. Fortunately there's no signs of active infection at this time. 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 He's been tolerating the dressing changes without complication. Fortunately there's no signs of  active infection at this time. I've been rather pleased with how things have progressed there still some evidence of pressure getting to the area with some redness right around the immediate wound opening. Nonetheless other than this I'm not seeing any significant complications or issues the wound is somewhat hyper granular. Upon discussing with the patient and his wife today I'm not sure that the wound is being packed to the base with the foam at this point. And if it's not been packed fully that may be part of the reason why is not seen as much improvement as far as the granulation from the base out. Again we do not want pack too tightly but we need some of the firm to get to the base of the wound. I discussed this with patient and his wife today. 12/06/18 on evaluation today patient appears to be doing well in regard to his right gluteal pressure ulcer. He's been tolerating the dressing changes without complication. Fortunately there's no signs of active infection. He still has some hyper granular tissue and I do think it would be appropriate to continue with the chemical cauterization as of today. 12/16/18 on evaluation today  patient actually appears to be doing okay in regard to his right gluteal ulcer. He is been tolerating the dressing changes without complication including the Wound VAC. Overall I feel like nothing seems to be worsening I do feel like that the hyper granulation buds in the central portion of the wound have improved to some degree with the silver nitrate. We will have to see how things continue to progress. 12/20/18 on evaluation today patient actually appears to be doing much worse in my pinion even compared to last week's evaluation. Unfortunately as opposed to showing any signs of improvement the areas of hyper granular tissue in the central portion of the wound seem to be getting worse. Subsequently the wound bed itself also seems to be getting deeper even compared to last week which is both unusual as well as concerning since prior he had been shown signs of improvement. Nonetheless I think that the issue could be that he's actually having some difficulty in issues with a deeper infection. There's no external signs of infection but nonetheless I am more worried about the internal, osteomyelitis, that could be restarting. He has not been on antibiotics for some time at this point. I think that it may be a good idea to go ahead and started back on an antibiotic therapy while we wait to see what the testing shows. 12/27/18 on evaluation today patient presents for follow-up concerning his left gluteal fold wound. Fortunately he appears to be doing well today. I did review the CT scan which was negative for any signs of osteomyelitis or acute abnormality this is excellent news. Overall I feel like the surface of the wound bed appears to be doing significantly better today compared to previously noted findings. There does not appear any signs of infection nor does he have any pain at this time. 01/03/19 on evaluation today patient actually appears to be doing quite well in regard to his ulcer. Post  debridement last week he really did not have too much bleeding which is good news. Fortunately today this seems to be doing some better but we still has some of the hyper granular tissue noted in the base of the wound which is gonna require sharp debridement today as well. Overall I'm pleased with how things seem to be progressing since we switched away from  the Wound VAC I think he is making some progress. 01/10/19 on evaluation today patient appears to be doing better in regard to his right gluteal fold ulcer. He has been tolerating the dressing changes without complication. The debridement to seem to be helping with current away some of the poor hyper granular tissue bugs throughout the region of his gluteal fold wound. He's been tolerating the dressing changes otherwise without complication which is great news. No fevers, chills, nausea, or vomiting noted at this time. 01/17/19 on evaluation today patient actually appears to be doing excellent in regard to his wound. He's been tolerating the dressing changes without complication. Fortunately there is no signs of active infection at this time which is great news. No fevers, chills, nausea, or vomiting noted at this time. 01/24/19 on evaluation today patient actually appears to be doing quite well with regard to his ulcer. He has been tolerating the dressing changes without complication. Fortunately there's no signs of active infection at this time. Overall been very pleased with the progress that he seems to be making currently. 01/31/19-Patient returns at 1 week with apparent similarity in dimensions to the wound, with no signs of infection, he has been SATVIK, WETHERBY. ([id number]) changing dressings twice a day 02/08/19 upon evaluation today patient actually appears to be doing well with regard to his right Ischial ulcer. The wound is not appear to be quite as deep and seems to be making progress which is good news. With that being said I'm  still reluctant to go back to the Wound VAC at this point. He's been having to change the dressings twice a day which is a little bit much in my pinion from the wound care supplies standpoint. I think that possibly attempting to utilize extras orbit may be beneficial this may also help to prevent any additional breakdown secondary to fluid retention in the wound itself. The patient is in agreement with giving this a try. 02/15/19 on evaluation today patient actually appears to be doing decently well with regard to his ulcer in the right to gluteal fold location. He's been tolerating the dressing changes without complication. Fortunately there is no signs of active infection at this time. He is able to keep the current dressing in place more effectively for a day at a time whereas before he was having a changes to to three times a day. The actions or has been helpful in this regard. Fortunately there's no signs of anything getting worse and I do feel like he showing signs of good improvement with regard to the wound bed status. 02/22/2019 patient appears to be doing very well today with regard to his ulcer in the gluteal fold. Fortunately there is no signs of active infection and he has been tolerating the dressing changes without any complication. Overall extremely pleased with how things seem to be progressing. He has much less of the hyper granular projections within the wound these have slowly been debrided away and he seems to be doing well. The wound bed is more uniform. 03/01/19 on evaluation today patient appears to be doing unfortunately about the same in regard to his gluteal ulcer. He's been tolerating the dressing changes without complication. Fortunately there's no signs of active infection at this time. With that being said he continues to develop these hyper granular projections which I'm unsure of exactly what they are and why they are rising. Nonetheless I explained to the patient that I  do believe it would be a good  idea for [place] to stand a biopsy sample for pathology to see if that can shed any light on what exactly may be going on here. Fortunately I do not see any obvious signs of infection. With that being said the patient has had a little bit more drainage this week apparently compared to last week. 03/08/2019 on evaluation today patient actually appears to look somewhat better with regard to the appearance of his wound bed at this time. This is good news. Overall I am very pleased with how things seem to have progressed just in the past week with a switch to the [facility] dressing. I think that has been beneficial for him. With that being said at this time the patient is concerned about his biopsy that I sent off last week unfortunately I do not have that report as of yet. Nonetheless we have called to obtain this and hopefully will hear back from the lab later this morning. 03/15/19 on evaluation today patient's wound actually appears to be doing okay today with regard to the overall appearance of the wound bed. He has been tolerating the dressing changes without complication he still has hyper granular tissue noted but fortunately that seems to be minimal at this point compared to some of what we've seen in the past. Nonetheless I do think that he is still having some issues currently with some of this type of granulation the biopsy and since all showed nothing more than just evidence of granulation tissue. Therefore there really is nothing different to initiate or do at this point. 03/24/19 on evaluation today patient appears to be doing a little better with regard to his ulcer. He's been tolerating the dressing changes without complication. Fortunately there is no signs of active infection at this time. No fevers, chills, nausea, or vomiting noted at this time. I'm overall pleased with how things seem to be progressing. 03/29/2019 on evaluation today patient appears to be  doing about the same in regard to his ulcer in the right gluteal fold. Unfortunately he is not seeming to make a lot of progress and the wound is somewhat stalled. There is no signs of active infection externally but I am concerned about the possibility of infection continuing in the ischial location which previously he did have infection noted. Again is not able to have an MRI so probably our best option for testing for this would be a triple phase bone scan which will detect subtle changes in the bone more so than plain film x-rays. In the past they really have not been beneficial and in fact the CT scans even have been somewhat questionable at times. Nonetheless there is no signs of systemic infection which is at least good news but again his wound is not healing at all on the predicted schedule. 04/05/19 on evaluation today patient appears to be doing well all things considering with regard to his wound and the right gluteal fold. He actually has his triple bone scan scheduled for sometime in the next couple of days. With that being said I've also been looking to other possibilities of what could be causing hyper granular tissue were looking into the bone scan again for evaluating for the risk or possibility of infection deeper and I'm also gonna go ahead and see about obtaining a deep tissue culture today to send and see if there's any evidence of infection noted on culture. He's in agreement with that plan. 04/12/2019 on evaluation today patient presents for reevaluation here in  our clinic concerning ongoing issues with his right gluteal fold ulcer. 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 It was discussed with him at that point that I think it would be appropriate [name], [name]. ([id number]) for him to proceed with hyperbaric oxygen therapy especially in light of the  fact that he is previously been on IV antibiotics at the beginning of the year for close to 3 months followed by several weeks of oral antibiotics that was all prescribed by infectious disease. He had surgical debridement around Christmas December 2019 due to an abscess and osteomyelitis of the ischial bone. Unfortunately this has not really proceeded as well as we would have liked and again we did a CT scan even a couple months ago as he cannot have an MRI secondary to having issues with both a pain pump as well as a spinal cord stimulator which prevent him from going into an MRI machine. With that being said there were chronic changes noted at the ischial tuberosity which had progressed since December 2019 there was no evidence of fluid collection on that initial CT scan. With that being said that was on January 21, 2019. I am not sure that it was a sensitive enough test as compared to an MRI and then subsequently I ordered a bone scan for him which was actually completed on 03/29/2019 and this revealed that he does indeed have positive osteomyelitis involving the right ischial tuberosity. This is adjacent to the ulcer and I think is the reason that his ulcer is not healing. Subsequently I am in a place him back on oral antibiotics today unfortunately his wound culture showed just mixed gram-negative flora with no specific findings of a predominant organism. Nonetheless being with the location I think a good broad-spectrum antibiotic for gram-negative's is a good choice at this point he is previously taken Levaquin without significant issues and I think that is an appropriate antibiotic for him at this time. 04/19/2019 on evaluation today patient actually appears to be doing fairly well with regard to his wound. He has been tolerating the dressing changes without complication. Fortunately there is no signs of active infection and he has been taking the oral antibiotics at this time. Subsequently we did  make a referral to infectious disease although Dr. Steva Ready wants the patient to be seen by general surgery first in order apparently to see if there is anything from a surgical standpoint that should be done prior to initiation of IV antibiotic therapy. Again the patient is okay with actually seeing Dr. Celine Ahr whom he has seen before we will make that referral. 04/26/2019 on evaluation today patient actually appears to be doing well with regard to his wound. He has been tolerating the dressing changes without complication. Fortunately there is no signs of active infection at this time. I do believe that the hyperbaric oxygen therapy along with the antibiotics which I prescribed at this point have been doing well for him. With that being said he has not seen Dr. Celine Ahr the surgeon yet in fact they have not been contacted for scheduling an appointment as of yet. Subsequently the patient is not on antibiotics currently by IV Dr. Steva Ready did want him to see Dr. Celine Ahr first which we are working on trying to get scheduled. We again give the information to the patient today for Dr. Celine Ahr and her number as far  as contacting their office to see about getting something scheduled. Again were looking for whether or not they recommend any surgical intervention. 05/03/2019 on evaluation today patient appears to be doing about the same with regard to his wound there may be a little bit of filling in in regard to the base of the wound but still he has quite a bit of hyper granular tissue that I am seeing today. I believe that he may may need a more aggressive sharp debridement possibly even by the surgeon or under anesthesia in order to clear away some of the hyper granular material in order to help allow for appropriate granulation to fill in. We have made a referral to Dr. Celine Ahr unfortunately it sounds as if they did not receive the referral we contacted them today they should be get in touch with the  patient. Depending on how things show from that standpoint the patient may need to see Dr. Ardyth Gal who is the infectious disease specialist although he really does not want a PICC line again. No fevers, chills, nausea, vomiting, or diarrhea. He is tolerating hyperbaric oxygen therapy very well. 05/12/2019 on evaluation today patient presents for follow-up visit concerning his right gluteal fold ulcer. He did see Dr. Celine Ahr the surgeon who previously evaluated his wound and she actually felt like he was doing quite well with regard to the wound based on what she was seen. He does seem to be responding to some degree with the oral antibiotics along with hyperbaric oxygen therapy at this point she did not see any evidence or need for further surgical intervention at this point. She recommended deferring additional or ongoing antibiotic therapy to Dr. Steva Ready at her discretion. Fortunately there is no signs of active systemic infection. No fevers, chills, nausea, vomiting, or diarrhea. 10/29; patient I am seeing really for the first time today in terms of his wound. 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 He is being treated with hyperbaric oxygen for underlying osteomyelitis most recently documented by a three-phase bone scan. He has been on Levaquin for roughly a month and he is out of these and asked me for a refill. Most recent cultures were negative although I do not think these were bone cultures. He has a very irregular surface to this wound which is almost nodular in texture. It undermines superiorly with the same nodular hyper granulated surface. I see that he was referred to general surgery for an operative debridement although the surgeon did not agree with this I think that would have been the proper course of action in this. He has been using Hydrofera Blue. He is supposed to start a wound VAC tomorrow which is actually a reapplication of wound VAC. I think  mostly last time they had trouble keeping the seal in place I hope we have better look at this this time. 05/24/2019 on evaluation today I did review patient's note from Dr. Dellia Nims where he was seen last week when I was out of the office. Subsequently it does appear that Dr. Dellia Nims was in agreement that the patient really needed debridement to clear away some of the nodular tissue in the base of the wound. The good news is he has the wound VAC initiated and some of this tissue is already starting to breakdown under the treatment of the wound VAC again because it is not really viable. IDEN, MASSETT ([id number]) Nonetheless I do believe that we can likely perform some debridement today  to clear this away and hopefully the continuation of the wound VAC along with hyperbarics and antibiotic therapy will be beneficial in stopping this from reoccurring. If we get better control in the wound bed in a good spot I think we can definitely use the PriMatrix which we actually did get approval for. 05/31/2019 on evaluation today patient appears to be doing actually pretty well at this point with regard to his wound. He has been tolerating the dressing changes which is good news. Fortunately there is no signs of infection. He still has some of the hyper granular nodules noted we have not really cleared all these away and I think we can try to do a little bit more that today. I did do quite a bit last week I am hoping to get down to the base of the wound so though actually be able to heal more appropriately. I think the wound VAC is doing a good job right now with controlling the drainage and overall the patient is happy with that. 06/07/2019 upon evaluation today patient appears to be doing quite well compared to last week with regard to the hyper granulation in the base of the wound. He has been tolerating the dressing changes without complication. Fortunately there is no signs of active infection at this  time. With that being said we have not had any recent measures as far as blood work is concerned I think we may want to check a few things including a CBC, sed rate, and C-reactive protein. Patient is in agreement with that we can try to get that scheduled for him for this Friday. 06/13/2019 on evaluation today patient actually appears to be doing much better at this point based on what I am seeing with regard to his wound. He has much less in the way of hyper granular tissue which is good news and a lot more healing that is taken place since I last saw him as far as the amount of space within the wound itself. With that being said he is nearing the completion of the initial 40 treatments for hyperbaric oxygen therapy I think this is something I would recommend extending as he does seem to be benefiting at this time. Fortunately there is no evidence of active systemic infection at this point nor even local infection for that matter. 06/20/2019 upon evaluation today patient actually appears to be doing well with regard to his wound. In fact this appears to be doing much better today compared to what it was previous. I been extremely pleased with the progress that he is made. In fact the tunnel is much less deep today than what it was in the past and I am seeing excellent signs of improvement. Overall I am happy with how things are going. I did review his white blood cell count which revealed everything to be okay. The one abnormal finding on his CBC was a hemoglobin of 12.7. Subsequently I did also review his sed rate and C-reactive protein. His sed rate measured in at normal level measuring at 26. The C-reactive protein however was elevated today and an abnormal range indicating inflammation. Still I feel like he is trending in a good direction at this time. He is going to need a refill of the Levaquin he tells me at this point. 07/04/2019 on evaluation today patient actually appears to be doing  well in regard to his wound. He has been tolerating the dressing changes without complication. Fortunately there is no signs of  active infection which is good news. No fevers, chills, nausea, vomiting, or diarrhea. The good news also is that I did review his labs and though his sed rate was slightly elevated the C-reactive protein was actually down compared to the last evaluation 2 weeks ago this is excellent news I think it is showing signs of improvement. 07/11/2019 on evaluation today patient actually appears to be doing quite well with regard to his wound on my evaluation. This is very slow going but nonetheless he seems to be making progress. Especially in regard to the depth. Nonetheless I believe that we may want to consider going forward with the PriMatrix which was previously approved for him. We will get a have to get a new approval as we will actually be applying this in the new year so we will likely obtain that approval on January 4. In the meantime and for the time being my suggestion is going to be that we go ahead and continue with the wound VAC and we were going at this point with the current wound care measures. 12/28-Patient seen after HBO session today, wound depth with the tunnel measures 3.2 cm, the wound bed appears healthy, continuing with the VAC and HBO 07/25/2019 on evaluation today patient's wound appears to be doing about the same compared to my last evaluation. Fortunately there is no evidence of significant infection which is good news we will still waiting on the results of his lab work which unfortunately was not done prior to today's visit which is what we were aiming for. Ultimately there is no sign of active infection at this time which is good news. And he also tells me that the wound VAC has been staying in place and doing fairly well which is also good news. With that being said I am concerned about the fact that the wound has not progressed as much I think we  may want to look into getting reapproval for the skin substitute, specifically PriMatrix, that I am hopeful will help to allow this area to granulate in more effectively. I would recommend as well continuing with the wound VAC along with this. 08/01/2019 on evaluation today patient appears to be doing in my opinion slightly better in regard to the wound. He actually does have some tightening of the walls around the edges which is a good thing. Fortunately there is no signs of active JACIEON, SHORTINO. ([id number]) infection at this time. No fever chills noted. I do believe that the patient in general seems to be making progress although is very slow. Obviously were trying to get things to speed up somewhat more. No fevers, chills, nausea, vomiting, or diarrhea. 08/08/2019 upon evaluation today patient appears to be doing well with regard to his wound although there is not a significant improvement overall we do have the PriMatrix for availability today to apply. Fortunately there is no signs of active infection and the patient overall seems to be doing well. There is really no need for aggressive sharp debridement at this point either which is also good news. I do believe her get a need to suture and the primary checks in the basin in the PriMatrix in order to keep it in place at the base of the wound underneath the wound VAC. This was discussed with the patient today as well. He is in agreement the plan. He really does not have any filling in the area and so this should not be a complication as far as suturing is  concerned. Electronic Signature(s) Signed: 08/08/2019 9:44:46 AM By: Worthy Keeler [name] Entered By: Worthy Keeler on 08/08/2019 09:44:46 TIMOHTY, LORETTE ([id number]) -------------------------------------------------------------------------------- Physical Exam Details Patient Name: [name] Date of Service: 08/08/2019 8:15 AM Medical Record Number: [id number] Patient  Account Number: [id number] Date of Birth/Sex: 1951/07/04 (68 y.o. M) Treating RN: [name] Primary Care Provider: [name] Other Clinician: Referring Provider: [name] Treating Provider/Extender: [name], Georgi Navarrete Weeks in Treatment: 52 Constitutional Well-nourished and well-hydrated in no acute distress. Respiratory normal breathing without difficulty. Psychiatric this patient is able to make decisions and demonstrates good insight into disease process. Alert and Oriented x 3. pleasant and cooperative. Notes Patient's wound bed currently showed signs of good granulation at this point. There were a couple slightly hyper granular areas but fortunately nothing to significant at this point. I did not have to perform sharp debridement although I did apply the first application of PriMatrix to the base of the tunnel as best I could. This was secured with 2 sutures in order to hold this for the most part in place so that it will not get lost during wound VAC changes. We will cover the area with Mepitel and then subsequently the wound VAC in behind this to pack into the tunnel. Electronic Signature(s) Signed: 08/08/2019 9:45:45 AM By: Worthy Keeler [name] Entered By: Worthy Keeler on 08/08/2019 09:45:44 DRILON, USSERY ([id number]) -------------------------------------------------------------------------------- Physician Orders Details Patient Name: [name] Date of Service: 08/08/2019 8:15 AM Medical Record Number: [id number] Patient Account Number: [id number] Date of Birth/Sex: December 28, 1950 (68 y.o. 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 - in office o Cleanse wound with mild soap and water Skin Barriers/Peri-Wound Care o Skin Prep Primary Wound Dressing Wound #1 Right Gluteal fold o Saline moistened gauze - in clinic only o Other: - contact layer applied over top of Primatrix sutured to secure under wound vac Dressing Change Frequency Wound #1 Right Gluteal fold o Change Dressing Monday, Wednesday, Friday Follow-up Appointments Wound #1 Right Gluteal fold o Return Appointment in 1 week. Off-Loading Wound #1 Right Gluteal fold o Turn and reposition every 2 hours - no pressure on wounded area Home Health Wound #1 Right Gluteal fold o Continue Home Health Visits - Advanced: remove dressing down to contact layer. If the contact layer comes off-I have sent extra with patient's wife. o Home Health Nurse may visit PRN to address patientos wound care needs. o FACE TO FACE ENCOUNTER: MEDICARE and MEDICAID PATIENTS: I certify that this patient is under my care and that I had a face-to-face encounter that meets the physician face-to-face encounter requirements with this KAYNEN, HRUZA ([id number]) patient on this date. The encounter with the patient was in whole or in part for the following MEDICAL CONDITION: (primary reason for [place]) MEDICAL NECESSITY: I certify, that based on my findings, NURSING services are a medically necessary home health service. HOME BOUND STATUS: I certify that my clinical findings support that this patient is homebound (i.e., Due to illness or injury,  pt requires aid of supportive devices such as crutches, cane, wheelchairs, walkers, the use of special transportation or the assistance of another person  to leave their place of residence. There is a normal inability to leave the home and doing so requires considerable and taxing effort. Other absences are for medical reasons / religious services and are infrequent or of short duration when for other reasons). o If current dressing causes regression in wound condition, may D/C ordered dressing product/s and apply Normal Saline Moist Dressing daily until next [place] / Other MD appointment. [place] of regression in wound condition at [phone]. o Please direct any NON-WOUND related issues/requests for orders to patient's Primary Care Physician Negative Pressure Wound Therapy Wound #1 Right Gluteal fold o Wound VAC settings at 125/130 mmHg continuous pressure. Use BLACK/GREEN foam to wound cavity. Use WHITE foam to fill any tunnel/s and/or undermining. Change VAC dressing 3 X WEEK. Change canister as indicated when full. Nurse may titrate settings and frequency of dressing changes as clinically indicated. - Primatrix skin substitute applied in clinic. Sutured into wound tunnel at 12:00. Do not remove. o Home Health Nurse may d/c VAC for s/s of increased infection, significant wound regression, or uncontrolled drainage. [place] at [phone]. o Apply contact layer over base of wound. - contact layer over sutured Primatrix in tunnel at 12:00. o Number of foam/gauze pieces used in the dressing = Medications-please add to medication list. Wound #1 Right Gluteal fold o P.O. Antibiotics - Refill and continue antibiotics. Patient Medications Allergies: penicillin, doxycycline, clindamycin Notifications Medication Indication Start End Levaquin 08/09/2019 DOSE 1 - oral 500 mg tablet - 1 tablet oral taken daily for 14 days. do not take iron with this medication Notes Primatrix application in clinic; including contact layer, fixation with sutures, saline moistened gauze and cover  dressing. NPWT applied over top of the skin sub. Electronic Signature(s) Signed: 08/09/2019 1:31:51 PM By: Worthy Keeler [name] Previous Signature: 08/08/2019 4:47:43 PM Version By: Worthy Keeler [name] Previous Signature: 08/08/2019 5:04:31 PM Version By: Gretta Cool BSN, RN, CWS, Kim RN, BSN Entered By: Worthy Keeler on 08/09/2019 13:31:48 [name], [name] ([id number]) -------------------------------------------------------------------------------- Problem List Details Patient Name: [name] Date of Service: 08/08/2019 8:15 AM Medical Record Number: [id number] Patient Account Number: [id number] Date of Birth/Sex: 1951/02/29 (68 y.o. M) Treating RN: [name] Primary Care Provider: [name] Other Clinician: Referring Provider: [name] Treating Provider/Extender: [name], Tonisha Silvey Weeks in Treatment: 40 Active Problems ICD-10 Evaluated Encounter Code Description Active Date Today Diagnosis L89.314 Pressure ulcer of right buttock, stage 4 11/30/2017 No Yes M86.68 Other chronic osteomyelitis, other site 04/08/2019 No Yes L03.317 Cellulitis of buttock 06/21/2018 No Yes G82.20 Paraplegia, unspecified 11/30/2017 No Yes S34.109S Unspecified injury to unspecified level of lumbar spinal cord, 11/30/2017 No Yes sequela I10 Essential (primary) hypertension 11/30/2017 No Yes Inactive Problems Resolved Problems Electronic Signature(s) Signed: 08/08/2019 8:24:04 AM By: Worthy Keeler [name] Entered By: Worthy Keeler on 08/08/2019 08:24:04 [name] ([id number]) -------------------------------------------------------------------------------- Progress Note Details Patient Name: [name] Date of Service: 08/08/2019 8:15 AM Medical Record Number: [id number] Patient Account Number: [id number] Date of Birth/Sex: September 22, 1950 (68 y.o. 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 He states that there is no history of diabetes that he is aware of. He does have issues with his prostate and is currently receiving treatment for this by way of oral medication. With that being said I do not have a lot of details in that regard. Nonetheless the patient presents today as a result of having been referred to [place] by another provider initially home health was set to come out and take care of his wound although due to the fact that he apparently drives he's not able to receive home health. His wife is therefore trying to help take care of this wound within although they have been struggling with what exactly to do at this point. She states that she can do some things but she is definitely not a nurse and does have some issues with looking at blood. The good news is the wound does not appear to be too deep and is fairly superficial at this point. There is no slough noted there is some nonviable skin noted around the surface of the wound and the perimeter at this point. The central portion of the wound appears to be very good with a dermal layer noted this does not appear to be again deep enough to extend it to subcutaneous tissue at this point. 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 In the pump he has Dilaudid and baclofen. 12/07/17 on evaluation today patient presents for follow-up concerning his ongoing lower back thigh ulcer on  the right. He states that he did not get the supplies ordered and therefore has not really been able to perform the dressing changes as directed exactly. His wife was able to get some Boarder Foam Dressing's from the drugstore and subsequently has been using hydrogel which did help to a degree in the wound does appear to be able smaller. There is actually more drainage this week noted than previous. 12/21/17 on evaluation today patient appears to be doing rather well in regard to his right gluteal ulcer. He has been tolerating the dressing changes without complication. There does not appear to be any evidence of infection at this point in time. Overall the wound does seem to be making some progress as far as the edges are concerned there's not as much in the way of overlapping of the external wound edges and he has a good epithelium to wound bed border for the most part. This however is not true right at the 12 o'clock location over the span of a little over a centimeters which actually will require debridement today to clean this away and hopefully allow it to continue to heal more appropriately. 12/28/17 on evaluation today patient appears to be doing rather well in regard to his ulcer in the left gluteal region. He's been tolerating the dressing changes without complication. Apparently he has had some difficulty getting his dressing material. Apparently there's been some confusion with ordering we're gonna check into this. Nonetheless overall he's been showing signs of improvement which is good news. Debridement is not required today. 01/04/18 on evaluation today patient presents for follow-up concerning his right gluteal ulcer. He has been tolerating the dressing changes fairly well. On inspection  today it appears he may actually have some maceration them concerned about the fact that he may be developing too much moisture in and around the wound bed which can cause delay in healing. With that  being said he unfortunately really has not showed significant signs of improvement since last week's evaluation in fact this may even be just the little bit/slightly larger. Nonetheless he's been having a lot of discomfort I'm not sure this is even related to the wound as he has no pain when I'm to breeding or otherwise cleaning the wound during evaluation today. Nonetheless this is something that we did recommend he talked to his pain specialist concerning. 01/11/18 on evaluation today patient appears to be doing better in regard to his ulceration. He has been tolerating the dressing [name], [name] changes without complication. With that being said overall there's no evidence of infection which is good news. The only thing is he did receive the hatch affair blue classic versus the ready nonetheless I feel like this is perfectly fine and appears to have done well for him over the past week. 01/25/18 on evaluation today patient's wound actually appears to be a little bit larger than during the last evaluation. The good news is the majority of the wound edges actually appear to be fairly firmly attached to the wound bed unfortunately again we're not really making progress in regard to the size. Roughly the wound is about the same size as when I first saw him although again the wound margin/edges appear to be much better. 02/01/18 on evaluation today patient actually appears to be doing very well in regard to his wound. Applying the Prisma dry does seem to be better although he does still have issues with slow progression of the wound. There was a slight improvement compared to last week's measurements today. Nonetheless I have been considering other options as far as the possibility of Theraskin or even a snap vac. In general I'm not sure that the Theraskin due to location of the wound would be a very good idea. Nonetheless I do think that a snap vac could be a possibility for the  patient and in fact I think this could even be an excellent way to manage the wound possibly seeing some improvement in a very rapid fashion here. Nonetheless this is something that we would need to get approved and I did have a lengthy conversation with the patient about this today. 02/08/18 on evaluation today patient appears to be doing a little better in regard to his ulcer. He has been tolerating the dressing changes without complication. Fortunately despite the fact that the wound is a little bit smaller it's not significantly so unfortunately. We have discussed the possibility of a snap vac we did check with insurance this is actually covered at this point. Fortunately there does not appear to be any sign of infection. Overall I'm fairly pleased with how things seem to be appearing at this point. 02/15/18 on evaluation today patient appears to be doing rather well in regard to his right gluteal ulcer. Unfortunately the snap vac did not stay in place with his sheer and friction this came loose and did not seem to maintain seal very well. He worked for about two days and it did seem to do very well during that time according to his wife but in general this does not seem to be something that's gonna be beneficial for him long-term. I do believe we need to go back  to standard dressings to see if we can find something that will be of benefit. 03/02/18- He is here in follow up evaluation; there is minimal change in the wound. He will continue with the same treatment plan, would consider changing to iodosrob/iodoflex if ulcer continues to to plateau. He will follow up next week 03/08/18 on evaluation today patient's wound actually appears to be about the same size as when I previously saw him several weeks back. Unfortunately he does have some slightly dark discoloration in the central portion of the wound which has me concerned about pressure injury. I do believe he may be sitting for too long a period  of time in fact he tells me that "I probably sit for much too long". He does have some Slough noted on the surface of the wound and again as far as the size of the wound is concerned I'm really not seeing anything that seems to have improved significantly. 03/15/18 on evaluation today patient appears to be doing fairly well in regard to his ulcer. The wound measured pretty much about the same today compared to last week's evaluation when looking at his graph. With that being said the area of bruising/deep tissue injury that was noted last week I do not see at this point. He did get a new cushion fortunately this does seem to be have been of benefit in my pinion. It does appear that he's been off of this more which is good news as well I think that is definitely showing in the overall wound measurements. With that being said I do believe that he needs to continue to offload I don't think that the fact this is doing better should be or is going to allow him to not have to offload and explain this to him as well. Overall he seems to be in agreement the plan I think he understands. The overall appearance of the wound bed is improved compared to last week I think the Iodoflex has been beneficial in that regard. 03/29/18 on evaluation today patient actually appears to be doing rather well in regard to his wound from the overall appearance standpoint he does have some granulation although there's some Slough on the surface of the wound noted as well. With that being said he unfortunately has not improved in regard to the overall measurement of the wound in volume or in size. I did have a discussion with him very specifically about offloading today. He actually does work although he mainly is just sitting throughout the day. He tells me he offloads by "lifting himself up for 30 seconds off of his chair occasionally" purchase from [employer] which does seem to have helped. And he has a new cushion that he  with that being said he's also able to stand some for a very short period of time but not significant enough I think to provide appropriate offloading. I think the biggest issue at this point with the wound and the fact is not healing as quickly as we would like is due to the fact that he is really not able to appropriately offload while at work. He states the beginning after his injury he actually had a bed at his job that he could lay on in order to offload and that does seem to have been of help back at that time. Nonetheless he had not done this in quite some time unfortunately. I think that could be helpful for him this is something I would like for  him to look into. STEVEY, DEMARY ([id number]) 04/05/18 on evaluation today patient actually presents for follow-up concerning his right gluteal ulcer. Again he really is not significantly improved even compared to last week. He has been tolerating the dressing changes without complication. With that being said fortunately there appears to be no evidence of infection at this time. He has been more proactive in trying to offload. 04/12/18 on evaluation today patient actually appears to be doing a little better in regard to his wound and the right gluteal fold region. He's been tolerating the dressing changes since removing the oasis without complication. However he was having a lot of burning initially with the oasis in place. He's unsure of exactly why this was given so much discomfort but he assumes that it was the oasis itself causing the problem. Nonetheless this had to be removed after about three days in place although even those three days seem to have made a fairly good improvement in regard to the overall appearance of the wound bed. In fact is the first time that he's made any improvement from the standpoint of measurements in about six weeks. He continues to have no discomfort over the area of the wound itself which leads me to wonder  why he was having the burning with the oasis when he does not even feel the actual debridement's themselves. I am somewhat perplexed by this. 04/19/18 on evaluation today patient's wound actually appears to be showing signs of epithelialization around the edge of the wound and in general actually appears to be doing better which is good news. He did have the same burning after about three days with applying the Endoform last week in the same fashion that I would generally apply a skin substitute. This seems to indicate that it's not the oasis to cause the problem but potentially the moisture buildup that just causes things to burn or there may be some other reaction with the skin prep or Steri-Strips. Nonetheless I'm not sure that is gonna be able to tolerate any skin substitute for a long period of time. The good news is the wound actually appears to be doing better today compared to last week and does seem to finally be making some progress. 04/26/18 on evaluation today patient actually appears to be doing rather well in regard to his ulcer in the right gluteal fold. He has been tolerating the dressing changes without complication which is good news. The Endoform does seem to be helping although he was a little bit more macerated this week. This seems to be an ongoing issue with fluid control at this point. Nonetheless I think we may be able to add something like Drawtex to help control the drainage. 05/03/18 on evaluation today patient appears to actually be doing better in regard to the overall appearance of his wound. He has been tolerating the dressing changes without complication. Fortunately there appears to be no evidence of infection at this time. I really feel like his wound has shown signs as of today of turning around last week I thought so as well and definitely he could be seen in this week's overall appearance and measurements. In general I'm very pleased with the fact that he finally  seems to be making a steady but sure progress. The patient likewise is very pleased. 05/17/18 on evaluation today patient appears to be doing more poorly unfortunately in regard to his ulcer. He has been tolerating the dressing changes without complication. With that being said he  tells me that in the past couple of days he and his wife have noticed that we did not seem to be doing quite as well is getting dark near the center. Subsequently upon evaluation today the wound actually does appear to be doing worse compared to previous. He has been tolerating the dressing changes otherwise and he states that he is not been sitting up anymore than he was in the past from what he tells me. Still he has continued to work he states "I'm tired of dealing with this and if I have to just go home and lay in the bed all the time that's what I'll do". Nonetheless I am concerned about the fact that this wound does appear to be deeper than what it was previous. 05/24/18 upon evaluation today patient actually presents after having been in the hospital due to what was presumed to be sepsis secondary to the wound infection. He had an elevated white blood cell count between 14 and 15. With that being said he does seem to be doing somewhat better now. His wound still is giving him some trouble nonetheless and he is obviously concerned about the fact likely talked about that this does seem to go more deeply than previously noted. I did review his wound culture which showed evidence of Staphylococcus aureus him and group B strep. Nonetheless he is on antibiotics, Levaquin, for this. Subsequently I did review his intake summary from the hospital as well. I also did look at the CT of the lumbar spine with contrast that was performed which showed no bone destruction to suggest lumbar disguises/osteomyelitis or sacral osteomyelitis. There was no paraspinal abscess. Nonetheless it appears this may have been more of just a  soft tissue infection at this point which is good news. He still is nonetheless concerned about the wound which again I think is completely reasonable considering everything he's been through recently. 05/31/18 on evaluation today on evaluation today patient actually appears to be showing signs of his wound be a little bit deeper than what I would like to see. Fortunately he does not show any signs of significant infection although his temperature was 99 today he states he's been checking this at home and has not been elevated. Nonetheless with the undermining that I'm seeing at this point I am becoming more concerned about the wound I do think that offloading is a key factor here that is preventing the speedy recovery at this point. There does not appear to be any evidence of again over infection noted. He's been using Santyl currently. MASAICHI, VANACKER ([id number]) 06/07/18 the patient presents today for follow-up evaluation regarding the left ulcer in the gluteal region. He has been tolerating the Wound VAC fairly well. He is obviously very frustrated with this he states that to mean is really getting in his way. There does not appear to be any evidence of infection at this time he does have a little bit of odor I do not necessarily associate this with infection just something that we sometimes notice with Wound VAC therapy. With that being said I can definitely catch a tone of discontentment overall in the patient's demeanor today. This when he was previously in the hospital an CT scan was done of the lumbar region which did not reveal any signs of osteomyelitis. With that being said the pelvis in particular was not evaluated distinctly which means he could still have some osteonecrosis I. Nonetheless the Wound VAC was started on Thursday I do  want to get this little bit more time before jumping to a CT scan of the pelvis although that is something that I might would recommend if were not  see an improvement by that time. 06/14/18 on evaluation today patient actually appears to be doing about the same in regard to his right gluteal ulcer. Again he did have a CT scan of the lumbar spine unfortunately this did not include the pelvis. Nonetheless with the depth of the wound that I'm seeing today even despite the fact that I'm not seeing any evidence of overt cellulitis I believe there's a good chance that we may be dealing with osteomyelitis somewhere in the right Ischial region. No fevers, chills, nausea, or vomiting noted at this time. 06/21/18 on evaluation today patient actually appears to be doing about the same with regard to his wound. The tunnel at 6 o'clock really does not appear to be any deeper although it is a little bit wider. I think at this point you may want to start packing this with white phone. Unfortunately I have not got approval for the CT scan of the pelvis as of yet due to the fact that Medicare apparently has been denied it due to the diagnosis codes not being appropriate according to Medicare for the test requested. With that being said the patient cannot have an MRI and therefore this is the only option that we have as far as testing is concerned. The patient has had infection and was on antibiotics and been added code for cellulitis of the bottom to see if this will be appropriate for getting the test approved. Nonetheless I'm concerned about the infection have been spread deeper into the Ischial region. 06/28/18 on evaluation today patient actually appears to be doing rather well all things considered in regard to the right gluteal ulcer. He has been tolerating the dressing changes without complication. With that being said the Wound VAC he states does have to be replaced almost every day or at least reinforced unfortunately. Patient actually has his CT scan later this morning we should have the results by tomorrow. 07/05/18 on evaluation today patient  presents for follow-up concerning his right Ischial ulcer. He did see the surgeon Dr. Lysle Pearl last week. They were actually very happy with him and felt like he spent a tremendous amount of time with them as far as discussing his situation was concerned. In the end Dr. Lysle Pearl did contact me as well and determine that he would not recommend any surgical intervention at this point as he felt like it would not be in the patient's best interest based on what he was seeing. He recommended a referral to infectious disease. Subsequently this is something that Dr. Ines Bloomer office is working on setting up for the patient. As far as evaluation today is concerned the patient's wound actually appears to be worse at this point. I am concerned about how things are progressing and specifically about infection. I do not feel like it's the deeper but the area of depth is definitely widening which does have me concerned. No fevers, chills, nausea, or vomiting noted at this time. I think that we do need initiate antibiotic therapy the patient has an allow allergy to amoxicillin/penicillin he states that he gets a rash since childhood. Nonetheless she's never had the issues with Catholics or cephalosporins in general but he is aware of. 07/27/18 on evaluation today patient presents following admission to the hospital on 07/09/18. He was subsequently discharged on 07/20/18. On  07/15/18 the patient underwent irrigation and debridement was soft tissue biopsy and bone biopsy as well as placement of a Wound VAC in the OR by Dr. Celine Ahr. During the hospital course the patient was placed on a Wound VAC and recommended follow up with surgery in three weeks actually with Dr. Delaine Lame who is infectious disease. The patient was on vancomycin during the hospital course. 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 He also had a bone culture which revealed evidence of methicillin-resistant staph aureus. He  is updated CT scan 07/09/18 reveals that he had progression of the which was performed on wound to breakdown down to the trochanter where he actually had irregularities there as well suggestive of osteomyelitis. This was a change just since 9 December when we last performed a CT scan. Obviously this one had gone downhill quite significantly and rapidly. At this point upon evaluation I feel like in general the patient's wound seems to be doing fairly well all things considered upon my evaluation today. Obviously this is larger and deeper than what I previously evaluated but at the same time he seems to be making some progress as far as the appearance of the granulation tissue is concerned. I'm happy in that regard. No fevers, chills, nausea, or vomiting noted at this time. He is on IV vancomycin and Rocephin at the facility. He is currently in [facility]. 08/03/18 upon evaluation today patient's wound appears to be doing better in regard to the overall appearance at this point in time. Fortunately he's been tolerating the Wound VAC without complication and states that the facility has been taking [name], [name]. ([id number]) excellent care of the wound site. Overall I see some Slough noted on the surface which I am going to attempt sharp debridement today of but nonetheless other than this I feel like he's making progress. 08/09/18 on evaluation today patient's wound appears to be doing much better compared to even last week's evaluation. Do believe that the Wound VAC is been of great benefit for him. He has been tolerating the dressing changes that is the Wound VAC without any complication and he has excellent granulation noted currently. There is no need for sharp debridement at this point. 08/16/18 on evaluation today patient actually appears to be doing very well in regard to the wound in the right gluteal fold region. This is showing signs of progress and again appears to be very  healthy which is excellent news. Fortunately there is no sign of active infection by way of odor or drainage at this point. Overall I'm very pleased with how things stand. He seems to be tolerating the Wound VAC without complication. 08/23/18 on evaluation today patient actually appears to be doing better in regard to his wound. He has been tolerating the Wound VAC without complication and in fact it has been collecting a significant amount of drainage which I think is good news especially considering how the wound appears. Fortunately there is no signs of infection at this time definitely nothing appears to be worse which is good news. He has not been started on the Bactrim and Flagyl that was recommended by Dr. Delaine Lame yet. I did actually contact her office this morning in order to check and see were things are that regard their gonna be calling me back. 08/30/18 on evaluation today patient actually appears to show signs of excellent improvement today compared to last evaluation. The undermining is getting much better the wound seems to  be feeling quite nicely and I'm very pleased that the granulation in general. With that being said overall I feel like the patient has made excellent progress which is great news. No fevers, chills, nausea, or vomiting noted at this time. 09/06/18 on evaluation today patient actually appears to be doing rather well in regard to his right gluteal ulcer. This is showing signs of improvement in overall I'm very pleased with how things seem to be progressing. The patient likewise is please. Overall I see no evidence of infection he is about to complete his oral antibiotic regimen which is the end of the antibiotics for him in just about three days. 09/13/18 on evaluation today patient's right Ischial ulcer appears to be showing signs of continued improvement which is excellent news. He's been tolerating the dressing changes without complication. Fortunately there's no  signs of infection and the wound that seems to be doing very well. 09/28/18 on evaluation today patient appears to be doing rather well in regard to his right Ischial ulcer. He's been tolerating the Wound VAC without complication he knows there's much less drainage than there used to be this obviously is not a bad thing in my pinion. There's no evidence of infection despite the fact is but nothing about it now for several weeks. 10/04/18 on evaluation today patient appears to be doing better in regard to his right Ischial wound. He has been tolerating the Wound VAC without complication and I do believe that the silver nitrate last week was beneficial for him. Fortunately overall there's no evidence of active infection at this time which is great news. No fevers, chills, nausea, or vomiting noted at this time. 10/11/18 on evaluation today patient actually appears to be doing rather well in regard to his Ischial ulcer. He's been tolerating the Wound VAC still without complication I feel like this is doing a good job. No fevers, chills, nausea, or vomiting noted at this time. 11/01/18 on evaluation today patient presents after having not been seen in our clinic for several weeks secondary to the fact that he was on evaluation today patient presents after having not been seen in our clinic for several weeks secondary to the fact that he was in a skilled nursing facility which was on lockdown currently due to the covert 19 national emergency. Subsequently he was discharged from the facility on this past Friday and subsequently made an appointment to come in to see yesterday. Fortunately there's no signs of active infection at this time which is good news and overall he does seem to have made progress since I last saw. Overall I feel like things are progressing quite nicely. The patient is having no pain. 11/08/18 on evaluation today patient appears to be doing okay in regard to his right gluteal ulcer. He  has been utilizing a Wound VAC home health this changing this at this point since he's home from the skilled nursing facility. Fortunately there's no signs of obvious active infection at this time. Unfortunately though there's no obvious active infection he is having some maceration and his wife states that when the sheets of the Wound VAC office on Sunday when it broke seal that he ended up having significant issues with some smell as well there concerned about the possibility of infection. Fortunately there's No fevers, chills, nausea, or vomiting noted at this time. AZAM, UDDIN ([id number]) 11/15/18 on evaluation today patient actually appears to be doing well in regard to his right gluteal ulcer. He has been  tolerating the dressing changes without complication. Specifically the Wound VAC has been utilized up to this point. Fortunately there's no signs of infection and overall I feel like he has made progress even since last week when I last saw him. I'm actually fairly happy with the overall appearance although he does seem to have somewhat of a hyper granular overgrowth in the central portion of the wound which I think may require some sharp debridement to try flatness out possibly utilizing chemical cauterization following. 11/23/18 on evaluation today patient actually appears to be doing very well in regard to his sacral ulcer. He seems to be showing signs of improvement with good granulation. With that being said he still has the small area of hyper granulation right in the central portion of the wound which I'm gonna likely utilize silver nitrate on today. Subsequently he also keeps having a leak at the 6 o'clock location which is unfortunate we may be able to help out with some suggestions to try to prevent this going forward. Fortunately there's no signs of active infection at this time. 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 He's been tolerating the dressing changes without complication. Fortunately there's no signs of active infection at this time. I've been rather pleased with how things have progressed there still some evidence of pressure getting to the area with some redness right around the immediate wound opening. Nonetheless other than this I'm not seeing any significant complications or issues the wound is somewhat hyper granular. Upon discussing with the patient and his wife today I'm not sure that the wound is being packed to the base with the foam at this point. And if it's not been packed fully that may be part of the reason why is not seen as much improvement as far as the granulation from the base out. Again we do not want pack too tightly but we need some of the firm to get to the base of the wound. I discussed this with patient and his wife today. 12/06/18 on evaluation today patient appears to be doing well in regard to his right gluteal pressure ulcer. He's been tolerating the dressing changes without complication. Fortunately there's no signs of active infection. He still has some hyper granular tissue and I do think it would be appropriate to continue with the chemical cauterization as of today. 12/16/18 on evaluation today patient actually appears to be doing okay in regard to his right gluteal ulcer. He is been tolerating the dressing changes without complication including the Wound VAC. Overall I feel like nothing seems to be worsening I do feel like that the hyper granulation buds in the central portion of the wound have improved to some degree with the silver nitrate. We will have to see how things continue to progress. 12/20/18 on evaluation today patient actually appears to be doing much worse in my pinion even compared to last week's evaluation. Unfortunately as opposed to showing any signs of improvement the areas of hyper granular tissue in the  central portion of the wound seem to be getting worse. Subsequently the wound bed itself also seems to be getting deeper even compared to last week which is both unusual as well as concerning since prior he had been shown signs of improvement. Nonetheless I think that the issue could be that he's actually having some difficulty in issues with a deeper infection. There's no  external signs of infection but nonetheless I am more worried about the internal, osteomyelitis, that could be restarting. He has not been on antibiotics for some time at this point. I think that it may be a good idea to go ahead and started back on an antibiotic therapy while we wait to see what the testing shows. 12/27/18 on evaluation today patient presents for follow-up concerning his left gluteal fold wound. Fortunately he appears to be doing well today. I did review the CT scan which was negative for any signs of osteomyelitis or acute abnormality this is excellent news. Overall I feel like the surface of the wound bed appears to be doing significantly better today compared to previously noted findings. There does not appear any signs of infection nor does he have any pain at this time. 01/03/19 on evaluation today patient actually appears to be doing quite well in regard to his ulcer. Post debridement last week he really did not have too much bleeding which is good news. Fortunately today this seems to be doing some better but we still has some of the hyper granular tissue noted in the base of the wound which is gonna require sharp debridement today as well. Overall I'm pleased with how things seem to be progressing since we switched away from the Wound VAC I think he is making some progress. 01/10/19 on evaluation today patient appears to be doing better in regard to his right gluteal fold ulcer. He has been tolerating the dressing changes without complication. The debridement to seem to be helping with current away some of  the poor hyper granular tissue bugs throughout the region of his gluteal fold wound. He's been tolerating the dressing changes otherwise without complication which is great news. No fevers, chills, nausea, or vomiting noted at this time. 01/17/19 on evaluation today patient actually appears to be doing excellent in regard to his wound. He's been tolerating the dressing changes without complication. Fortunately there is no signs of active infection at this time which is great news. No fevers, chills, nausea, or vomiting noted at this time. ESSEX, HABA ([id number]) 01/24/19 on evaluation today patient actually appears to be doing quite well with regard to his ulcer. He has been tolerating the dressing changes without complication. Fortunately there's no signs of active infection at this time. Overall been very pleased with the progress that he seems to be making currently. 01/31/19-Patient returns at 1 week with apparent similarity in dimensions to the wound, with no signs of infection, he has been changing dressings twice a day 02/08/19 upon evaluation today patient actually appears to be doing well with regard to his right Ischial ulcer. The wound is not appear to be quite as deep and seems to be making progress which is good news. With that being said I'm still reluctant to go back to the Wound VAC at this point. He's been having to change the dressings twice a day which is a little bit much in my pinion from the wound care supplies standpoint. I think that possibly attempting to utilize extras orbit may be beneficial this may also help to prevent any additional breakdown secondary to fluid retention in the wound itself. The patient is in agreement with giving this a try. 02/15/19 on evaluation today patient actually appears to be doing decently well with regard to his ulcer in the right to gluteal fold location. He's been tolerating the dressing changes without complication. Fortunately there  is no signs of active infection at  this time. He is able to keep the current dressing in place more effectively for a day at a time whereas before he was having a changes to to three times a day. The actions or has been helpful in this regard. Fortunately there's no signs of anything getting worse and I do feel like he showing signs of good improvement with regard to the wound bed status. 02/22/2019 patient appears to be doing very well today with regard to his ulcer in the gluteal fold. Fortunately there is no signs of active infection and he has been tolerating the dressing changes without any complication. Overall extremely pleased with how things seem to be progressing. He has much less of the hyper granular projections within the wound these have slowly been debrided away and he seems to be doing well. The wound bed is more uniform. 03/01/19 on evaluation today patient appears to be doing unfortunately about the same in regard to his gluteal ulcer. He's been tolerating the dressing changes without complication. Fortunately there's no signs of active infection at this time. With that being said he continues to develop these hyper granular projections which I'm unsure of exactly what they are and why they are rising. Nonetheless I explained to the patient that I do believe it would be a good idea for [place] to stand a biopsy sample for pathology to see if that can shed any light on what exactly may be going on here. Fortunately I do not see any obvious signs of infection. With that being said the patient has had a little bit more drainage this week apparently compared to last week. 03/08/2019 on evaluation today patient actually appears to look somewhat better with regard to the appearance of his wound bed at this time. This is good news. Overall I am very pleased with how things seem to have progressed just in the past week with a switch to the [facility] dressing. I think that has been beneficial  for him. With that being said at this time the patient is concerned about his biopsy that I sent off last week unfortunately I do not have that report as of yet. Nonetheless we have called to obtain this and hopefully will hear back from the lab later this morning. 03/15/19 on evaluation today patient's wound actually appears to be doing okay today with regard to the overall appearance of the wound bed. He has been tolerating the dressing changes without complication he still has hyper granular tissue noted but fortunately that seems to be minimal at this point compared to some of what we've seen in the past. Nonetheless I do think that he is still having some issues currently with some of this type of granulation the biopsy and since all showed nothing more than just evidence of granulation tissue. Therefore there really is nothing different to initiate or do at this point. 03/24/19 on evaluation today patient appears to be doing a little better with regard to his ulcer. He's been tolerating the dressing changes without complication. Fortunately there is no signs of active infection at this time. No fevers, chills, nausea, or vomiting noted at this time. I'm overall pleased with how things seem to be progressing. 03/29/2019 on evaluation today patient appears to be doing about the same in regard to his ulcer in the right gluteal fold. Unfortunately he is not seeming to make a lot of progress and the wound is somewhat stalled. There is no signs of active infection externally but I am concerned  about the possibility of infection continuing in the ischial location which previously he did have infection noted. Again is not able to have an MRI so probably our best option for testing for this would be a triple phase bone scan which will detect subtle changes in the bone more so than plain film x-rays. In the past they really have not been beneficial and in fact the CT scans even have been somewhat questionable  at times. Nonetheless there is no signs of systemic infection which is at least good news but again his wound is not healing at all on the predicted schedule. 04/05/19 on evaluation today patient appears to be doing well all things considering with regard to his wound and the right gluteal fold. He actually has his triple bone scan scheduled for sometime in the next couple of days. With that being said I've also been looking to other possibilities of what could be causing hyper granular tissue were looking into the bone scan again for evaluating for the risk or possibility of infection deeper and I'm also gonna go ahead and see about obtaining a deep tissue Yellin, Rahmel J. ([id number]) culture today to send and see if there's any evidence of infection noted on culture. He's in agreement with that plan. 04/12/2019 on evaluation today patient presents for reevaluation here in our clinic concerning ongoing issues with his right gluteal fold ulcer. 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 It was discussed with him at that point that I think it would be appropriate for him to proceed with hyperbaric oxygen therapy especially in light of the fact that he is previously been on IV antibiotics at the beginning of the year for close to 3 months followed by several weeks of oral antibiotics that was all prescribed by infectious disease. He had surgical debridement around Christmas December 2019 due to an abscess and osteomyelitis of the ischial bone. Unfortunately this has not really proceeded as well as we would have liked and again we did a CT scan even a couple months ago as he cannot have an MRI secondary to having issues with both a pain pump as well as a spinal cord stimulator which prevent him from going into an MRI machine. With that being said there were chronic changes noted at  the ischial tuberosity which had progressed since December 2019 there was no evidence of fluid collection on that initial CT scan. With that being said that was on January 21, 2019. I am not sure that it was a sensitive enough test as compared to an MRI and then subsequently I ordered a bone scan for him which was actually completed on 03/29/2019 and this revealed that he does indeed have positive osteomyelitis involving the right ischial tuberosity. This is adjacent to the ulcer and I think is the reason that his ulcer is not healing. Subsequently I am in a place him back on oral antibiotics today unfortunately his wound culture showed just mixed gram-negative flora with no specific findings of a predominant organism. Nonetheless being with the location I think a good broad-spectrum antibiotic for gram-negative's is a good choice at this point he is previously taken Levaquin without significant issues and I think that is an appropriate antibiotic for him at this time. 04/19/2019 on evaluation today patient actually appears to be doing fairly well with regard to his wound. He has been tolerating  the dressing changes without complication. Fortunately there is no signs of active infection and he has been taking the oral antibiotics at this time. Subsequently we did make a referral to infectious disease although Dr. Steva Ready wants the patient to be seen by general surgery first in order apparently to see if there is anything from a surgical standpoint that should be done prior to initiation of IV antibiotic therapy. Again the patient is okay with actually seeing Dr. Celine Ahr whom he has seen before we will make that referral. 04/26/2019 on evaluation today patient actually appears to be doing well with regard to his wound. He has been tolerating the dressing changes without complication. Fortunately there is no signs of active infection at this time. I do believe that the hyperbaric oxygen therapy along with  the antibiotics which I prescribed at this point have been doing well for him. With that being said he has not seen Dr. Celine Ahr the surgeon yet in fact they have not been contacted for scheduling an appointment as of yet. Subsequently the patient is not on antibiotics currently by IV Dr. Steva Ready did want him to see Dr. Celine Ahr first which we are working on trying to get scheduled. We again give the information to the patient today for Dr. Celine Ahr and her number as far as contacting their office to see about getting something scheduled. Again were looking for whether or not they recommend any surgical intervention. 05/03/2019 on evaluation today patient appears to be doing about the same with regard to his wound there may be a little bit of filling in in regard to the base of the wound but still he has quite a bit of hyper granular tissue that I am seeing today. I believe that he may may need a more aggressive sharp debridement possibly even by the surgeon or under anesthesia in order to clear away some of the hyper granular material in order to help allow for appropriate granulation to fill in. We have made a referral to Dr. Celine Ahr unfortunately it sounds as if they did not receive the referral we contacted them today they should be get in touch with the patient. Depending on how things show from that standpoint the patient may need to see Dr. Ardyth Gal who is the infectious disease specialist although he really does not want a PICC line again. No fevers, chills, nausea, vomiting, or diarrhea. He is tolerating hyperbaric oxygen therapy very well. 05/12/2019 on evaluation today patient presents for follow-up visit concerning his right gluteal fold ulcer. He did see Dr. Celine Ahr the surgeon who previously evaluated his wound and she actually felt like he was doing quite well with regard to the wound based on what she was seen. He does seem to be responding to some degree with the oral antibiotics along  with hyperbaric oxygen therapy at this point she did not see any evidence or need for further surgical intervention at this point. She recommended deferring additional or ongoing antibiotic therapy to Dr. Steva Ready at her discretion. Fortunately there is no signs of active systemic infection. No fevers, chills, nausea, vomiting, or diarrhea. 10/29; patient I am seeing really for the first time today in terms of his wound. 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 He is being treated with hyperbaric oxygen for underlying osteomyelitis most recently documented by a three-phase bone scan. He has been on Levaquin for roughly a month and he is out of these and asked me for a refill. Most  recent cultures were negative although I do not think these were bone cultures. He has a very irregular surface to this wound which is almost nodular in texture. It undermines superiorly with the same nodular hyper granulated surface. I see that he was referred to general surgery for an operative debridement although the surgeon did not agree with this I think that would have been the proper course of action in this. He has been using Hydrofera Blue. He is supposed to start a wound VAC tomorrow which is actually a reapplication of wound VAC. I think mostly last time they had trouble keeping the seal in place I hope we have better look at this this time. ALBINO, MCCLARAN ([id number]) 05/24/2019 on evaluation today I did review patient's note from Dr. Dellia Nims where he was seen last week when I was out of the office. Subsequently it does appear that Dr. Dellia Nims was in agreement that the patient really needed debridement to clear away some of the nodular tissue in the base of the wound. The good news is he has the wound VAC initiated and some of this tissue is already starting to breakdown under the treatment of the wound VAC again because it is not really viable. Nonetheless I do believe that we  can likely perform some debridement today to clear this away and hopefully the continuation of the wound VAC along with hyperbarics and antibiotic therapy will be beneficial in stopping this from reoccurring. If we get better control in the wound bed in a good spot I think we can definitely use the PriMatrix which we actually did get approval for. 05/31/2019 on evaluation today patient appears to be doing actually pretty well at this point with regard to his wound. He has been tolerating the dressing changes which is good news. Fortunately there is no signs of infection. He still has some of the hyper granular nodules noted we have not really cleared all these away and I think we can try to do a little bit more that today. I did do quite a bit last week I am hoping to get down to the base of the wound so though actually be able to heal more appropriately. I think the wound VAC is doing a good job right now with controlling the drainage and overall the patient is happy with that. 06/07/2019 upon evaluation today patient appears to be doing quite well compared to last week with regard to the hyper granulation in the base of the wound. He has been tolerating the dressing changes without complication. Fortunately there is no signs of active infection at this time. With that being said we have not had any recent measures as far as blood work is concerned I think we may want to check a few things including a CBC, sed rate, and C-reactive protein. Patient is in agreement with that we can try to get that scheduled for him for this Friday. 06/13/2019 on evaluation today patient actually appears to be doing much better at this point based on what I am seeing with regard to his wound. He has much less in the way of hyper granular tissue which is good news and a lot more healing that is taken place since I last saw him as far as the amount of space within the wound itself. With that being said he is nearing  the completion of the initial 40 treatments for hyperbaric oxygen therapy I think this is something I would recommend extending as he does seem  to be benefiting at this time. Fortunately there is no evidence of active systemic infection at this point nor even local infection for that matter. 06/20/2019 upon evaluation today patient actually appears to be doing well with regard to his wound. In fact this appears to be doing much better today compared to what it was previous. I been extremely pleased with the progress that he is made. In fact the tunnel is much less deep today than what it was in the past and I am seeing excellent signs of improvement. Overall I am happy with how things are going. I did review his white blood cell count which revealed everything to be okay. The one abnormal finding on his CBC was a hemoglobin of 12.7. Subsequently I did also review his sed rate and C-reactive protein. His sed rate measured in at normal level measuring at 26. The C-reactive protein however was elevated today and an abnormal range indicating inflammation. Still I feel like he is trending in a good direction at this time. He is going to need a refill of the Levaquin he tells me at this point. 07/04/2019 on evaluation today patient actually appears to be doing well in regard to his wound. He has been tolerating the dressing changes without complication. Fortunately there is no signs of active infection which is good news. No fevers, chills, nausea, vomiting, or diarrhea. The good news also is that I did review his labs and though his sed rate was slightly elevated the C-reactive protein was actually down compared to the last evaluation 2 weeks ago this is excellent news I think it is showing signs of improvement. 07/11/2019 on evaluation today patient actually appears to be doing quite well with regard to his wound on my evaluation. This is very slow going but nonetheless he seems to be making progress.  Especially in regard to the depth. Nonetheless I believe that we may want to consider going forward with the PriMatrix which was previously approved for him. We will get a have to get a new approval as we will actually be applying this in the new year so we will likely obtain that approval on January 4. In the meantime and for the time being my suggestion is going to be that we go ahead and continue with the wound VAC and we were going at this point with the current wound care measures. 12/28-Patient seen after HBO session today, wound depth with the tunnel measures 3.2 cm, the wound bed appears healthy, continuing with the VAC and HBO 07/25/2019 on evaluation today patient's wound appears to be doing about the same compared to my last evaluation. Fortunately there is no evidence of significant infection which is good news we will still waiting on the results of his lab work which unfortunately was not done prior to today's visit which is what we were aiming for. Ultimately there is no sign of active infection at this time which is good news. And he also tells me that the wound VAC has been staying in place and doing fairly well which is also good news. With that being said I am concerned about the fact that the wound has not progressed as much [name], [name]. ([id number]) I think we may want to look into getting reapproval for the skin substitute, specifically PriMatrix, that I am hopeful will help to allow this area to granulate in more effectively. I would recommend as well continuing with the wound VAC along with this. 08/01/2019 on evaluation today patient  appears to be doing in my opinion slightly better in regard to the wound. He actually does have some tightening of the walls around the edges which is a good thing. Fortunately there is no signs of active infection at this time. No fever chills noted. I do believe that the patient in general seems to be making progress although is very  slow. Obviously were trying to get things to speed up somewhat more. No fevers, chills, nausea, vomiting, or diarrhea. 08/08/2019 upon evaluation today patient appears to be doing well with regard to his wound although there is not a significant improvement overall we do have the PriMatrix for availability today to apply. Fortunately there is no signs of active infection and the patient overall seems to be doing well. There is really no need for aggressive sharp debridement at this point either which is also good news. I do believe her get a need to suture and the primary checks in the basin in the PriMatrix in order to keep it in place at the base of the wound underneath the wound VAC. This was discussed with the patient today as well. He is in agreement the plan. He really does not have any filling in the area and so this should not be a complication as far as suturing is concerned. Objective Constitutional Well-nourished and well-hydrated in no acute distress. Vitals Time Taken: 8:25 AM, Height: 73 in, Weight: 210 lbs, BMI: 27.7, Temperature: 98.8 F, Pulse: 61 bpm, Respiratory Rate: 16 breaths/min, Blood Pressure: 133/84 mmHg. Respiratory normal breathing without difficulty. Psychiatric this patient is able to make decisions and demonstrates good insight into disease process. Alert and Oriented x 3. pleasant and cooperative. General Notes: Patient's wound bed currently showed signs of good granulation at this point. There were a couple slightly hyper granular areas but fortunately nothing to significant at this point. I did not have to perform sharp debridement although I did apply the first application of PriMatrix to the base of the tunnel as best I could. This was secured with 2 sutures in order to hold this for the most part in place so that it will not get lost during wound VAC changes. We will cover the area with Mepitel and then subsequently the wound VAC in behind this to pack into  the tunnel. Integumentary (Hair, Skin) Wound #1 status is Open. Original cause of wound was Pressure Injury. The wound is located on the Right Gluteal fold. The wound measures 2.6cm length x 1.6cm width x 2.7cm depth; 3.267cm^2 area and 8.822cm^3 volume. There is muscle and Fat Layer (Subcutaneous Tissue) Exposed exposed. There is no undermining noted, however, there is tunneling at 12:00 with a maximum distance of 4.2cm. There is a large amount of serous drainage noted. The wound margin is epibole. There is large (67-100%) pink, hyper - granulation within the wound bed. There is a small (1-33%) amount of necrotic tissue within the wound bed including Adherent Slough. 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 A skin graft procedure using a bioengineered skin substitute/cellular or tissue based product was performed by [name], Nicolasa Milbrath E., [name]. Primatrix was applied and secured with Sutured. 4 sq cm of product was utilized and 0 sq cm  was wasted. Post Application, Mepitel was applied. A Time Out was conducted at 08:45, prior to the start of the procedure. The procedure was tolerated well. Post procedure Diagnosis Wound #1: Same as Pre-Procedure . Plan Wound Cleansing: Wound #1 Right Gluteal fold: Clean wound with Normal Saline. - in office Cleanse wound with mild soap and water Skin Barriers/Peri-Wound Care: Skin Prep Primary Wound Dressing: Wound #1 Right Gluteal fold: Saline moistened gauze - in clinic only Other: - contact layer applied over top of Primatrix sutured to secure under wound vac Dressing Change  Frequency: Wound #1 Right Gluteal fold: Change Dressing Monday, Wednesday, Friday Follow-up Appointments: Wound #1 Right Gluteal fold: Return Appointment in 1 week. Off-Loading: Wound #1 Right Gluteal fold: Turn and reposition every 2 hours - no pressure on wounded area Home Health: Wound #1 Right Gluteal fold: Continue Home Health Visits - Advanced: remove dressing down to contact layer. If the contact layer comes off-I have sent extra with patient's wife. Home Health Nurse may visit PRN to address patient s wound care needs. FACE TO FACE ENCOUNTER: MEDICARE and MEDICAID PATIENTS: I certify that this patient is under my care and that I TYRIS, SALSEDO. ([id number]) had a face-to-face encounter that meets the physician face-to-face encounter requirements with this patient on this date. The encounter with the patient was in whole or in part for the following MEDICAL CONDITION: (primary reason for [place]) MEDICAL NECESSITY: I certify, that based on my findings, NURSING services are a medically necessary home health service. HOME BOUND STATUS: I certify that my clinical findings support that this patient is homebound (i.e., Due to illness or injury, pt requires aid of supportive devices such as crutches, cane, wheelchairs, walkers, the use of special transportation or the assistance of another person to leave their place of residence. There is a normal inability to leave the home and doing so requires considerable and taxing effort. Other absences are for medical reasons / religious services and are infrequent or of short duration when for other reasons). If current dressing causes regression in wound condition, may D/C ordered dressing product/s and apply Normal Saline Moist Dressing daily until next [place] / Other MD appointment. [place] of regression in wound condition at [phone]. Please direct any NON-WOUND related issues/requests for  orders to patient's Primary Care Physician Negative Pressure Wound Therapy: Wound #1 Right Gluteal fold: Wound VAC settings at 125/130 mmHg continuous pressure. Use BLACK/GREEN foam to wound cavity. Use WHITE foam to fill any tunnel/s and/or undermining. Change VAC dressing 3 X WEEK. Change canister as indicated when full. Nurse may titrate settings and frequency of dressing changes as clinically indicated. - Primatrix skin substitute applied in clinic. Sutured into wound tunnel at 12:00. Do not remove. Home Health Nurse may d/c VAC for s/s of increased infection, significant wound regression, or uncontrolled drainage. [place] at [phone]. Apply contact layer over base of wound. - contact layer over sutured Primatrix in tunnel at 12:00. Number of foam/gauze pieces used in the dressing = Medications-please add to medication list.: Wound #1 Right Gluteal fold: P.O. Antibiotics - Refill and continue antibiotics. The following medication(s) was prescribed: Levaquin oral 500 mg tablet 1 1 tablet oral taken daily for 14 days. do not take iron with this medication starting 08/09/2019 General Notes: Primatrix application in clinic; including contact layer, fixation with sutures, saline moistened gauze and cover dressing. NPWT applied over top of the skin sub. 1. My suggestion at this time  is can be that we going continue with the current wound care measures which includes the try matrix followed by a contact layer and then subsequently the wound VAC. 2. I would recommend as well that we also continue with here in the clinic using saline moistened gauze over top of the contact layer in order to get him back home and then subsequently his nurse will come out and apply the wound VAC at that point. 3. I recommend continued offloading and again reiterated that the patient needs to not be sitting up long periods of time and needs to spend more time in bed on his side he tells me that  this is the case and what he does but nonetheless is still of utmost importance in order to get this area to heal. We will see patient back for reevaluation in 1 week here in the clinic. If anything worsens or changes patient will contact our office for additional recommendations. Electronic Signature(s) Signed: 08/09/2019 1:32:13 PM By: Worthy Keeler [name] Previous Signature: 08/08/2019 9:46:43 AM Version By: Worthy Keeler [name] Entered By: Worthy Keeler on 08/09/2019 13:32:12 DONTREL, SPRAKER ([id number]) -------------------------------------------------------------------------------- SuperBill Details Patient Name: [name] Date of Service: 08/08/2019 Medical Record Number: [id number] Patient Account Number: [id number] Date of Birth/Sex: 04/15/1951 (68 y.o. 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, sequela I10 Essential (primary) hypertension Facility Procedures CPT4 Code: [id number] Description: [id number]- Primatrix, 2 X 2 sq cm Modifier: Quantity: 4 CPT4 Code: [id number] Description: [id number] - SKIN SUB GRAFT TRNK/ARM/LEG ICD-10 Diagnosis Description L89.314 Pressure ulcer of right buttock, stage 4 Modifier: Quantity: 1 Physician Procedures CPT4 Code: [id number] Description: [id number] - WC PHYS SKIN SUB GRAFT TRNK/ARM/LEG ICD-10 Diagnosis Description L89.314 Pressure ulcer of right buttock, stage 4 Modifier: Quantity: 1 Electronic Signature(s) Signed: 08/08/2019 9:47:21 AM By: Worthy Keeler [name] Entered By: Worthy Keeler on 08/08/2019 09:47:20

## 2019-08-08 NOTE — Progress Notes (Addendum)
JAHMIEL, TISBY ([id number]) Visit Report for 08/08/2019 Arrival Information Details Patient Name: MCGWIRE, GAEBLER Date of Service: 08/08/2019 8:15 AM Medical Record Number: [id number] Patient Account Number: [id number] Date of Birth/Sex: [birth year] (69 y.o. M) Treating RN: [name] Primary Care Saory Carriero: [name] Other Clinician: Referring Kynlie Jane: [name] Treating Marcellene Shivley/Extender: [name], HOYT Weeks in Treatment: 16 Visit Information History Since Last Visit Added or deleted any medications: No Patient Arrived: Wheel Chair Any new allergies or adverse reactions: No Arrival Time: 08:24 Had a fall or experienced change in No Accompanied By: wife activities of daily living that may affect Transfer Assistance: Manual risk of falls: Patient Identification Verified: Yes Signs or symptoms of abuse/neglect since last visito No Secondary Verification Process Completed: Yes Hospitalized since last visit: No Patient Requires Transmission-Based No Implantable device outside of the clinic excluding No Precautions: cellular tissue based products placed in the center Patient Has Alerts: Yes since last visit: Patient Alerts: NOT Has Dressing in Place as Prescribed: Yes Diabetic Pain Present Now: Yes Electronic Signature(s) Signed: 08/08/2019 11:15:10 AM By: Lorine Bears RCP, RRT, CHT Entered By: Lorine Bears on 08/08/2019 08:24:35 DAL, FLAUGH ([id number]) -------------------------------------------------------------------------------- Encounter Discharge Information Details Patient Name: [name] Date of Service: 08/08/2019 8:15 AM Medical Record Number: [id number] Patient Account Number: [id number] Date of Birth/Sex: 13-Jan-1951 (68 y.o. M) Treating RN: [name] Primary Care Michille Mcelrath: [name] Other Clinician: Referring Helen Winterhalter: [name] Treating Lindsey Demonte/Extender: [name],  HOYT Weeks in Treatment: 41 Encounter Discharge Information Items Post Procedure Vitals Discharge Condition: Stable Temperature (F): 98.8 Ambulatory Status: Wheelchair Pulse (bpm): 61 Discharge Destination: Home Respiratory Rate (breaths/min): 16 Transportation: Private Auto Blood Pressure (mmHg): 133/84 Accompanied By: wife Schedule Follow-up Appointment: Yes Clinical Summary of Care: Electronic Signature(s) Signed: 08/08/2019 5:04:31 PM By: Gretta Cool, BSN, RN, CWS, Kim RN, BSN Entered By: Gretta Cool, BSN, RN, CWS, Kim on 08/08/2019 09:07:17 TREASURE, LISONBEE ([id number]) -------------------------------------------------------------------------------- Lower Extremity Assessment Details Patient Name: [name] Date of Service: 08/08/2019 8:15 AM Medical Record Number: [id number] Patient Account Number: [id number] Date of Birth/Sex: [birth year] (68 y.o. M) Treating RN: Montey Hora Primary Care Welborn Keena: [name] Other Clinician: Referring Adali Pennings: [name] Treating Kaylynn Chamblin/Extender: [name], HOYT Weeks in Treatment: 37 Electronic Signature(s) Signed: 08/08/2019 4:42:47 PM By: Montey Hora Entered By: Montey Hora on 08/08/2019 08:32:08 DEVANTAE, SELLMAN ([id number]) -------------------------------------------------------------------------------- Multi Wound Chart Details Patient Name: [name] Date of Service: 08/08/2019 8:15 AM Medical Record Number: [id number] Patient Account Number: [id number] Date of Birth/Sex: Nov 02, 1950 (68 y.o. M) Treating RN: [name] Primary Care Zubayr Bednarczyk: [name] Other Clinician: Referring Ceejay Kegley: [name] Treating Ndia Sampath/Extender: [name], HOYT Weeks in Treatment: 41 Vital Signs Height(in): 73 Pulse(bpm): 61 Weight(lbs): 210 Blood Pressure(mmHg): 133/84 Body Mass Index(BMI): 28 Temperature(F): 98.8 Respiratory Rate 16 (breaths/min): Photos: [N/A:N/A] Wound Location:  Right Gluteal fold N/A N/A Wounding Event: Pressure Injury N/A N/A Primary Etiology: Pressure Ulcer N/A N/A Comorbid History: Cataracts, Hypertension, N/A N/A Paraplegia Date Acquired: 11/02/2017 N/A N/A Weeks of Treatment: 88 N/A N/A Wound Status: Open N/A N/A Measurements L x W x D 2.6x1.6x2.7 N/A N/A (cm) Area (cm) : 3.267 N/A N/A Volume (cm) : 8.822 N/A N/A % Reduction in Area: 67.50% N/A N/A % Reduction in Volume: -777.80% N/A N/A Position 1 (o'clock): 12 Maximum Distance 1 (cm): 4.2 Tunneling: Yes N/A N/A Classification: Category/Stage IV N/A N/A Exudate Amount: Large N/A N/A Exudate Type: Serous N/A N/A Exudate Color: amber N/A N/A Wound Margin: Epibole N/A N/A  Granulation Amount: Large (67-100%) N/A N/A Granulation Quality: Pink, Hyper-granulation N/A N/A Necrotic Amount: Small (1-33%) N/A N/A Exposed Structures: Fat Layer (Subcutaneous N/A N/A Tissue) Exposed: Yes Muscle: Yes Fascia: No TYRANN, GRUETT ([id number]) Tendon: No Joint: No Bone: No Epithelialization: Small (1-33%) N/A N/A Treatment Notes Electronic Signature(s) Signed: 08/08/2019 5:04:31 PM By: Gretta Cool, BSN, RN, CWS, Kim RN, BSN Entered By: Gretta Cool, BSN, RN, CWS, Kim on 08/08/2019 08:44:47 SACHIN, GASSNER ([id number]) -------------------------------------------------------------------------------- [place] Details Patient Name: [name] Date of Service: 08/08/2019 8:15 AM Medical Record Number: [id number] Patient Account Number: [id number] Date of Birth/Sex: [birth year] (68 y.o. M) Treating RN: [name] Primary Care Hether Anselmo: [name] Other Clinician: Referring Yeshua Stryker: [name] Treating Savien Mamula/Extender: [name], HOYT Weeks in Treatment: 78 Active Inactive Orientation to the Wound Care Program Nursing Diagnoses: Knowledge deficit related to the wound healing center program Goals: Patient/caregiver will verbalize understanding of the  [facility] [facility] Date Initiated: 11/30/2017 Target Resolution Date: 12/21/2017 Goal Status: Active Interventions: Provide education on orientation to the wound center Notes: Pressure Nursing Diagnoses: Knowledge deficit related to management of pressures ulcers Goals: Patient/caregiver will verbalize understanding of pressure ulcer management Date Initiated: 05/17/2018 Target Resolution Date: 05/28/2018 Goal Status: Active Interventions: Assess: immobility, friction, shearing, incontinence upon admission and as needed Notes: Wound/Skin Impairment Nursing Diagnoses: Impaired tissue integrity Goals: Patient/caregiver will verbalize understanding of skin care regimen Date Initiated: 11/30/2017 Target Resolution Date: 12/21/2017 Goal Status: Active Ulcer/skin breakdown will have a volume reduction of 30% by week 4 Date Initiated: 11/30/2017 Target Resolution Date: 12/21/2017 Goal Status: Active PATRICE, ELLERS ([id number]) Interventions: Assess patient/caregiver ability to obtain necessary supplies Assess patient/caregiver ability to perform ulcer/skin care regimen upon admission and as needed Assess ulceration(s) every visit Treatment Activities: Skin care regimen initiated : 11/30/2017 Notes: Electronic Signature(s) Signed: 08/08/2019 5:04:31 PM By: Gretta Cool, BSN, RN, CWS, Kim RN, BSN Entered By: Gretta Cool, BSN, RN, CWS, Kim on 08/08/2019 08:44:40 EXZAVIER, BACKES ([id number]) -------------------------------------------------------------------------------- Pain Assessment Details Patient Name: [name] Date of Service: 08/08/2019 8:15 AM Medical Record Number: [id number] Patient Account Number: [id number] Date of Birth/Sex: [birth year] (68 y.o. M) Treating RN: [name] Primary Care Ramzi Brathwaite: [name] Other Clinician: Referring Dafney Farler: [name] Treating Avalene Sealy/Extender: [name], HOYT Weeks in Treatment: 46 Active Problems Location of  Pain Severity and Description of Pain Patient Has Paino Yes Site Locations Rate the pain. Current Pain Level: 7 Pain Management and Medication Current Pain Management: Electronic Signature(s) Signed: 08/08/2019 11:15:10 AM By: Lorine Bears RCP, RRT, CHT Signed: 08/08/2019 5:04:31 PM By: Gretta Cool, BSN, RN, CWS, Kim RN, BSN Entered By: Lorine Bears on 08/08/2019 08:24:47 FON, RIJO ([id number]) -------------------------------------------------------------------------------- Patient/Caregiver Education Details Patient Name: [name] Date of Service: 08/08/2019 8:15 AM Medical Record Number: [id number] Patient Account Number: [id number] Date of Birth/Gender: [birth year] (68 y.o. M) Treating RN: [name] Primary Care Physician: [name] Other Clinician: Referring Physician: [name] Treating Physician/Extender: Sharalyn Ink in Treatment: 83 Education Assessment Education Provided To: Patient Education Topics Provided Pressure: Handouts: Pressure Ulcers: Care and Offloading Methods: Demonstration, Explain/Verbal Responses: State content correctly Wound/Skin Impairment: Handouts: Caring for Your Ulcer Methods: Demonstration, Explain/Verbal Responses: State content correctly Electronic Signature(s) Signed: 08/08/2019 5:04:31 PM By: Gretta Cool, BSN, RN, CWS, Kim RN, BSN Entered By: Gretta Cool, BSN, RN, CWS, Kim on 08/08/2019 09:05:32 AAMIL, MERCHANT ([id number]) -------------------------------------------------------------------------------- Wound Assessment Details Patient Name: [name] Date of Service: 08/08/2019 8:15 AM Medical Record Number: [id number] Patient  Account Number: [id number] Date of Birth/Sex: 03/05/1951 (69 y.o. M) Treating RN: Montey Hora Primary Care Katerra Ingman: [name] Other Clinician: Referring Jermal Dismuke: [name] Treating Cathryne Mancebo/Extender: [name], HOYT Weeks  in Treatment: 88 Wound Status Wound Number: 1 Primary Etiology: Pressure Ulcer Wound Location: Right Gluteal fold Wound Status: Open Wounding Event: Pressure Injury Comorbid History: Cataracts, Hypertension, Paraplegia Date Acquired: 11/02/2017 Weeks Of Treatment: 88 Clustered Wound: No Photos Wound Measurements Length: (cm) 2.6 % Reductio Width: (cm) 1.6 % Reductio Depth: (cm) 2.7 Epithelial Area: (cm) 3.267 Tunneling Volume: (cm) 8.822 Positi Maximum n in Area: 67.5% n in Volume: -777.8% ization: Small (1-33%) : Yes on (o'clock): 12 Distance: (cm) 4.2 Undermining: No Wound Description Classification: Category/Stage IV Foul Odor Wound Margin: Epibole Slough/Fib Exudate Amount: Large Exudate Type: Serous Exudate Color: amber After Cleansing: No rino Yes Wound Bed Granulation Amount: Large (67-100%) Exposed Structure Granulation Quality: Pink, Hyper-granulation Fascia Exposed: No Necrotic Amount: Small (1-33%) Fat Layer (Subcutaneous Tissue) Exposed: Yes Necrotic Quality: Adherent Slough Tendon Exposed: No Muscle Exposed: Yes Necrosis of Muscle: No Joint Exposed: No DALI, SIMONIAN. ([id number]) Bone Exposed: No Treatment Notes Wound #1 (Right Gluteal fold) Notes Primatrix applied, mepitel, saline moistened gauze, cover dressing. HH to replace wound vac later today. Electronic Signature(s) Signed: 08/08/2019 4:42:47 PM By: Montey Hora Entered By: Montey Hora on 08/08/2019 08:40:40 JAVEN, BALDASSARE ([id number]) -------------------------------------------------------------------------------- Vitals Details Patient Name: [name] Date of Service: 08/08/2019 8:15 AM Medical Record Number: [id number] Patient Account Number: [id number] Date of Birth/Sex: [birth year] (68 y.o. M) Treating RN: [name] Primary Care Dalani Mette: [name] Other Clinician: Referring Shena Vinluan: [name] Treating Arturo Sofranko/Extender: [name],  HOYT Weeks in Treatment: 21 Vital Signs Time Taken: 08:25 Temperature (F): 98.8 Height (in): 73 Pulse (bpm): 61 Weight (lbs): 210 Respiratory Rate (breaths/min): 16 Body Mass Index (BMI): 27.7 Blood Pressure (mmHg): 133/84 Reference Range: 80 - 120 mg / dl Electronic Signature(s) Signed: 08/08/2019 11:15:10 AM By: Lorine Bears RCP, RRT, CHT Entered By: Lorine Bears on 08/08/2019 08:26:30

## 2019-08-15 ENCOUNTER — Other Ambulatory Visit: Payer: Self-pay

## 2019-08-15 ENCOUNTER — Encounter: Payer: Medicare Other | Admitting: Physician Assistant

## 2019-08-15 DIAGNOSIS — L89314 Pressure ulcer of right buttock, stage 4: Secondary | ICD-10-CM | POA: Diagnosis not present

## 2019-08-15 NOTE — Progress Notes (Signed)
SHAYDON, ROESCH ([id number]) Visit Report for 08/15/2019 Arrival Information Details Patient Name: [name], [name] Date of Service: 08/15/2019 8:00 AM Medical Record Number: [id number] Patient Account Number: [id number] Date of Birth/Sex: Sep 20, 1950 (69 y.o. M) Treating RN: Army Melia Primary Care Decarla Siemen: [name] Other Clinician: Referring Bricia Taher: [name] Treating Jamaal Bernasconi/Extender: [name], HOYT Weeks in Treatment: 41 Visit Information History Since Last Visit Added or deleted any medications: No Patient Arrived: Wheel Chair Any new allergies or adverse reactions: No Arrival Time: 08:07 Had a fall or experienced change in No Accompanied By: wife activities of daily living that may affect Transfer Assistance: None risk of falls: Patient Identification Verified: Yes Signs or symptoms of abuse/neglect since last visito No Patient Requires Transmission-Based No Hospitalized since last visit: No Precautions: Has Dressing in Place as Prescribed: Yes Patient Has Alerts: Yes Pain Present Now: No Patient Alerts: NOT Diabetic Electronic Signature(s) Signed: 08/15/2019 12:59:59 PM By: Army Melia Entered By: Army Melia on 08/15/2019 08:07:57 [name] ([id number]) -------------------------------------------------------------------------------- Clinic Level of Care Assessment Details Patient Name: [name] Date of Service: 08/15/2019 8:00 AM Medical Record Number: [id number] Patient Account Number: [id number] Date of Birth/Sex: 18-Jun-1951 (69 y.o. M) Treating RN: Army Melia Primary Care Maile Linford: [name] Other Clinician: Referring Ed Mandich: [name] Treating Khayree Delellis/Extender: [name], HOYT Weeks in Treatment: 26 Clinic Level of Care Assessment Items TOOL 4 Quantity Score []  - Use when only an EandM is performed on FOLLOW-UP visit 0 ASSESSMENTS - Nursing Assessment / Reassessment X - Reassessment of  Co-morbidities (includes updates in patient status) 1 10 X- 1 5 Reassessment of Adherence to Treatment Plan ASSESSMENTS - Wound and Skin Assessment / Reassessment X - Simple Wound Assessment / Reassessment - one wound 1 5 []  - 0 Complex Wound Assessment / Reassessment - multiple wounds []  - 0 Dermatologic / Skin Assessment (not related to wound area) ASSESSMENTS - Focused Assessment []  - Circumferential Edema Measurements - multi extremities 0 []  - 0 Nutritional Assessment / Counseling / Intervention []  - 0 Lower Extremity Assessment (monofilament, tuning fork, pulses) []  - 0 Peripheral Arterial Disease Assessment (using hand held doppler) ASSESSMENTS - Ostomy and/or Continence Assessment and Care []  - Incontinence Assessment and Management 0 []  - 0 Ostomy Care Assessment and Management (repouching, etc.) PROCESS - Coordination of Care X - Simple Patient / Family Education for ongoing care 1 15 []  - 0 Complex (extensive) Patient / Family Education for ongoing care X- 1 10 Staff obtains Programmer, systems, Records, Test Results / Process Orders []  - 0 Staff telephones HHA, Nursing Homes / Clarify orders / etc []  - 0 Routine Transfer to another Facility (non-emergent condition) []  - 0 Routine Hospital Admission (non-emergent condition) []  - 0 New Admissions / Biomedical engineer / Ordering NPWT, Apligraf, etc. []  - 0 Emergency Hospital Admission (emergent condition) X- 1 10 Simple Discharge Coordination [name], [name] ([id number]) []  - 0 Complex (extensive) Discharge Coordination PROCESS - Special Needs []  - Pediatric / Minor Patient Management 0 []  - 0 Isolation Patient Management []  - 0 Hearing / Language / Visual special needs []  - 0 Assessment of Community assistance (transportation, D/C planning, etc.) []  - 0 Additional assistance / Altered mentation []  - 0 Support Surface(s) Assessment (bed, cushion, seat, etc.) INTERVENTIONS - Wound Cleansing /  Measurement X - Simple Wound Cleansing - one wound 1 5 []  - 0 Complex Wound Cleansing - multiple wounds X- 1 5 Wound Imaging (photographs - any number of wounds) []  - 0 Wound  Tracing (instead of photographs) X- 1 5 Simple Wound Measurement - one wound []  - 0 Complex Wound Measurement - multiple wounds INTERVENTIONS - Wound Dressings []  - Small Wound Dressing one or multiple wounds 0 X- 1 15 Medium Wound Dressing one or multiple wounds []  - 0 Large Wound Dressing one or multiple wounds []  - 0 Application of Medications - topical []  - 0 Application of Medications - injection INTERVENTIONS - Miscellaneous []  - External ear exam 0 []  - 0 Specimen Collection (cultures, biopsies, blood, body fluids, etc.) []  - 0 Specimen(s) / Culture(s) sent or taken to Lab for analysis []  - 0 Patient Transfer (multiple staff / Civil Service fast streamer / Similar devices) []  - 0 Simple Staple / Suture removal (25 or less) []  - 0 Complex Staple / Suture removal (26 or more) []  - 0 Hypo / Hyperglycemic Management (close monitor of Blood Glucose) []  - 0 Ankle / Brachial Index (ABI) - do not check if billed separately X- 1 5 Vital Signs [name], [name] ([id number]) Has the patient been seen at the hospital within the last three years: Yes Total Score: 90 Level Of Care: New/Established - Level 3 Electronic Signature(s) Signed: 08/15/2019 12:59:59 PM By: Army Melia Entered By: Army Melia on 08/15/2019 08:40:49 [name] ([id number]) -------------------------------------------------------------------------------- Encounter Discharge Information Details Patient Name: [name] Date of Service: 08/15/2019 8:00 AM Medical Record Number: [id number] Patient Account Number: [id number] Date of Birth/Sex: 05-03-1951 (69 y.o. M) Treating RN: Army Melia Primary Care Xolani Degracia: [name] Other Clinician: Referring Pressley Tadesse: [name] Treating Hani Patnode/Extender: [name],  HOYT Weeks in Treatment: 73 Encounter Discharge Information Items Discharge Condition: Stable Ambulatory Status: Wheelchair Discharge Destination: Home Transportation: Private Auto Accompanied By: wife Schedule Follow-up Appointment: Yes Clinical Summary of Care: Electronic Signature(s) Signed: 08/15/2019 12:59:59 PM By: Army Melia Entered By: Army Melia on 08/15/2019 08:41:36 [name], [name] ([id number]) -------------------------------------------------------------------------------- Lower Extremity Assessment Details Patient Name: [name] Date of Service: 08/15/2019 8:00 AM Medical Record Number: [id number] Patient Account Number: [id number] Date of Birth/Sex: [birth year] (69 y.o. M) Treating RN: Army Melia Primary Care Vito Beg: [name] Other Clinician: Referring Tateanna Bach: [name] Treating Emmitte Surgeon/Extender: [name], HOYT Weeks in Treatment: 66 Electronic Signature(s) Signed: 08/15/2019 12:59:59 PM By: Army Melia Entered By: Army Melia on 08/15/2019 08:21:26 PRYCE, FLY ([id number]) -------------------------------------------------------------------------------- Multi Wound Chart Details Patient Name: [name] Date of Service: 08/15/2019 8:00 AM Medical Record Number: [id number] Patient Account Number: [id number] Date of Birth/Sex: 12-14-1950 (69 y.o. M) Treating RN: Army Melia Primary Care Sheli Dorin: [name] Other Clinician: Referring Steele Ledonne: [name] Treating Joshlyn Beadle/Extender: [name], HOYT Weeks in Treatment: 89 Vital Signs Height(in): 73 Pulse(bpm): 80 Weight(lbs): 210 Blood Pressure(mmHg): 128/72 Body Mass Index(BMI): 28 Temperature(F): 98.3 Respiratory Rate 16 (breaths/min): Photos: [N/A:N/A] Wound Location: Right Gluteal fold N/A N/A Wounding Event: Pressure Injury N/A N/A Primary Etiology: Pressure Ulcer N/A N/A Comorbid History: Cataracts, Hypertension, N/A  N/A Paraplegia Date Acquired: 11/02/2017 N/A N/A Weeks of Treatment: 89 N/A N/A Wound Status: Open N/A N/A Measurements L x W x D 2.6x1.8x2.6 N/A N/A (cm) Area (cm) : 3.676 N/A N/A Volume (cm) : 9.557 N/A N/A % Reduction in Area: 63.40% N/A N/A % Reduction in Volume: -850.90% N/A N/A Position 1 (o'clock): 12 Maximum Distance 1 (cm): 4.2 Tunneling: Yes N/A N/A Classification: Category/Stage IV N/A N/A Exudate Amount: Large N/A N/A Exudate Type: Serous N/A N/A Exudate Color: amber N/A N/A Wound Margin: Epibole N/A N/A Granulation Amount: Large (67-100%) N/A N/A Granulation  Quality: Pink, Hyper-granulation N/A N/A Necrotic Amount: Small (1-33%) N/A N/A Exposed Structures: Fat Layer (Subcutaneous N/A N/A Tissue) Exposed: Yes Muscle: Yes Fascia: No [name], [name] ([id number]) Tendon: No Joint: No Bone: No Epithelialization: Small (1-33%) N/A N/A Treatment Notes Electronic Signature(s) Signed: 08/15/2019 12:59:59 PM By: Army Melia Entered By: Army Melia on 08/15/2019 08:33:12 [name], [name] ([id number]) -------------------------------------------------------------------------------- [place] Details Patient Name: [name] Date of Service: 08/15/2019 8:00 AM Medical Record Number: [id number] Patient Account Number: [id number] Date of Birth/Sex: 1950/08/11 (69 y.o. M) Treating RN: Army Melia Primary Care [name]: [name] Other Clinician: Referring Virgie Chery: [name] Treating Arilyn Brierley/Extender: [name], HOYT Weeks in Treatment: 37 Active Inactive Orientation to the Wound Care Program Nursing Diagnoses: Knowledge deficit related to the wound healing center program Goals: Patient/caregiver will verbalize understanding of the [facility] Program Date Initiated: 11/30/2017 Target Resolution Date: 12/21/2017 Goal Status: Active Interventions: Provide education on orientation to the wound  center Notes: Pressure Nursing Diagnoses: Knowledge deficit related to management of pressures ulcers Goals: Patient/caregiver will verbalize understanding of pressure ulcer management Date Initiated: 05/17/2018 Target Resolution Date: 05/28/2018 Goal Status: Active Interventions: Assess: immobility, friction, shearing, incontinence upon admission and as needed Notes: Wound/Skin Impairment Nursing Diagnoses: Impaired tissue integrity Goals: Patient/caregiver will verbalize understanding of skin care regimen Date Initiated: 11/30/2017 Target Resolution Date: 12/21/2017 Goal Status: Active Ulcer/skin breakdown will have a volume reduction of 30% by week 4 Date Initiated: 11/30/2017 Target Resolution Date: 12/21/2017 Goal Status: Active [name], [name] ([id number]) Interventions: Assess patient/caregiver ability to obtain necessary supplies Assess patient/caregiver ability to perform ulcer/skin care regimen upon admission and as needed Assess ulceration(s) every visit Treatment Activities: Skin care regimen initiated : 11/30/2017 Notes: Electronic Signature(s) Signed: 08/15/2019 12:59:59 PM By: Army Melia Entered By: Army Melia on 08/15/2019 08:33:03 [name], [name] ([id number]) -------------------------------------------------------------------------------- Pain Assessment Details Patient Name: [name] Date of Service: 08/15/2019 8:00 AM Medical Record Number: [id number] Patient Account Number: [id number] Date of Birth/Sex: [birth year] (69 y.o. M) Treating RN: Army Melia Primary Care Latonga Ponder: [name] Other Clinician: Referring Tyrick Dunagan: [name] Treating Elyse Prevo/Extender: [name], HOYT Weeks in Treatment: 75 Active Problems Location of Pain Severity and Description of Pain Patient Has Paino No Site Locations Pain Management and Medication Current Pain Management: Electronic Signature(s) Signed: 08/15/2019 12:59:59 PM By: Army Melia Entered By: Army Melia on 08/15/2019 08:08:03 [name] ([id number]) -------------------------------------------------------------------------------- Patient/Caregiver Education Details Patient Name: [name] Date of Service: 08/15/2019 8:00 AM Medical Record Number: [id number] Patient Account Number: [id number] Date of Birth/Gender: 1951-04-27 (69 y.o. M) Treating RN: Army Melia Primary Care Physician: [name] Other Clinician: Referring Physician: [name] Treating Physician/Extender: Sharalyn Ink in Treatment: 107 Education Assessment Education Provided To: Patient Education Topics Provided Wound/Skin Impairment: Handouts: Caring for Your Ulcer Methods: Demonstration, Explain/Verbal Responses: State content correctly Electronic Signature(s) Signed: 08/15/2019 12:59:59 PM By: Army Melia Entered By: Army Melia on 08/15/2019 08:41:04 [name] ([id number]) -------------------------------------------------------------------------------- Wound Assessment Details Patient Name: [name] Date of Service: 08/15/2019 8:00 AM Medical Record Number: [id number] Patient Account Number: [id number] Date of Birth/Sex: 11/11/1950 (69 y.o. M) Treating RN: Army Melia Primary Care Maddyx Wieck: [name] Other Clinician: Referring Jniyah Dantuono: [name] Treating Amaad Byers/Extender: [name], HOYT Weeks in Treatment: 89 Wound Status Wound Number: 1 Primary Etiology: Pressure Ulcer Wound Location: Right Gluteal fold Wound Status: Open Wounding Event: Pressure Injury Comorbid History: Cataracts, Hypertension, Paraplegia Date Acquired: 11/02/2017 Weeks Of Treatment: 89 Clustered Wound:  No Photos Wound Measurements Length: (cm) 2.6 % Reductio Width: (cm) 1.8 % Reductio Depth: (cm) 2.6 Epithelial Area: (cm) 3.676 Tunneling Volume: (cm) 9.557 Positi Maximum n in Area: 63.4% n in Volume:  -850.9% ization: Small (1-33%) : Yes on (o'clock): 12 Distance: (cm) 4.2 Undermining: No Wound Description Classification: Category/Stage IV Foul Odor Wound Margin: Epibole Slough/Fib Exudate Amount: Large Exudate Type: Serous Exudate Color: amber After Cleansing: No rino Yes Wound Bed Granulation Amount: Large (67-100%) Exposed Structure Granulation Quality: Pink, Hyper-granulation Fascia Exposed: No Necrotic Amount: Small (1-33%) Fat Layer (Subcutaneous Tissue) Exposed: Yes Necrotic Quality: Adherent Slough Tendon Exposed: No Muscle Exposed: Yes Necrosis of Muscle: No Joint Exposed: No [name], Chrystopher J. ([id number]) Bone Exposed: No Treatment Notes Wound #1 (Right Gluteal fold) Notes endoform, saline moistened gauze, cover dressing. HH to replace wound vac later today. Electronic Signature(s) Signed: 08/15/2019 12:59:59 PM By: Army Melia Entered By: Army Melia on 08/15/2019 08:21:12 PRATEEK, CHRISTAN ([id number]) -------------------------------------------------------------------------------- Vitals Details Patient Name: [name] Date of Service: 08/15/2019 8:00 AM Medical Record Number: [id number] Patient Account Number: [id number] Date of Birth/Sex: 05-30-1951 (69 y.o. M) Treating RN: Army Melia Primary Care Tylisa Alcivar: [name] Other Clinician: Referring Enio Hornback: [name] Treating Taveon Enyeart/Extender: [name], HOYT Weeks in Treatment: 22 Vital Signs Time Taken: 08:08 Temperature (F): 98.3 Height (in): 73 Pulse (bpm): 80 Weight (lbs): 210 Respiratory Rate (breaths/min): 16 Body Mass Index (BMI): 27.7 Blood Pressure (mmHg): 128/72 Reference Range: 80 - 120 mg / dl Electronic Signature(s) Signed: 08/15/2019 12:59:59 PM By: Army Melia Entered By: Army Melia on 08/15/2019 08:08:16

## 2019-08-15 NOTE — Progress Notes (Signed)
[name], [name] ([id number]) Visit Report for 08/15/2019 Chief Complaint Document Details Patient Name: [name], [name] Date of Service: 08/15/2019 8:00 AM Medical Record Number: [id number] Patient Account Number: [id number] Date of Birth/Sex: Feb 18, 1951 (68 y.o. M) Treating RN: [name] Primary Care Provider: [name] Other Clinician: Referring Provider: [name] Treating Provider/Extender: [name], HOYT Weeks in Treatment: 27 Information Obtained from: Patient Chief Complaint Right gluteal fold ulcer Electronic Signature(s) Signed: 08/15/2019 4:16:35 PM By: Worthy Keeler [name] Entered By: Worthy Keeler on 08/15/2019 08:13:50 [name], [name] ([id number]) -------------------------------------------------------------------------------- HPI Details Patient Name: [name] Date of Service: 08/15/2019 8:00 AM Medical Record Number: [id number] Patient Account Number: [id number] Date of Birth/Sex: 02/19/1951 (68 y.o. 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 He states that there is no history of diabetes that he is aware of. He does have issues with his prostate and is currently receiving treatment for this by way of oral medication. With that being said I do not have a lot of details in that regard. Nonetheless the patient presents today as a result of having been referred to [place] by another provider initially home health was set to come out and take care of his wound although due to the fact that he  apparently drives he's not able to receive home health. His wife is therefore trying to help take care of this wound within although they have been struggling with what exactly to do at this point. She states that she can do some things but she is definitely not a nurse and does have some issues with looking at blood. The good news is the wound does not appear to be too deep and is fairly superficial at this point. There is no slough noted there is some nonviable skin noted around the surface of the wound and the perimeter at this point. The central portion of the wound appears to be very good with a dermal layer noted this does not appear to be again deep enough to extend it to subcutaneous tissue at this point. 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 In the pump he has Dilaudid and baclofen. 12/07/17 on evaluation today patient presents for follow-up concerning his ongoing lower back thigh ulcer on the right. He states that he did not get the supplies ordered and therefore has not really been able to perform the dressing changes as directed exactly. His wife was able to get some Boarder Foam Dressing's from the drugstore and subsequently has been using hydrogel which did help to a degree in the wound does appear to be able smaller. There is actually more drainage this week noted than previous. 12/21/17 on evaluation today patient appears to be doing rather well in regard to his right gluteal ulcer. He has been tolerating the dressing changes without complication. There does not appear to be any evidence of infection at this point in time. Overall the wound does seem to be making some progress as far as the edges  are concerned there's not as much in the way of overlapping of the external wound edges and he has a good epithelium to wound bed border for the most part. This however is not true  right at the 12 o'clock location over the span of a little over a centimeters which actually will require debridement today to clean this away and hopefully allow it to continue to heal more appropriately. 12/28/17 on evaluation today patient appears to be doing rather well in regard to his ulcer in the left gluteal region. He's been tolerating the dressing changes without complication. Apparently he has had some difficulty getting his dressing material. Apparently there's been some confusion with ordering we're gonna check into this. Nonetheless overall he's been showing signs of improvement which is good news. Debridement is not required today. 01/04/18 on evaluation today patient presents for follow-up concerning his right gluteal ulcer. He has been tolerating the dressing changes fairly well. On inspection today it appears he may actually have some maceration them concerned about the fact that he may be developing too much moisture in and around the wound bed which can cause delay in healing. With that being said he unfortunately really has not showed significant signs of improvement since last week's evaluation in fact this may even be just the little bit/slightly larger. Nonetheless he's been having a lot of discomfort I'm not sure this is even related to the wound as he has no pain when I'm to breeding or otherwise cleaning the wound during evaluation today. Nonetheless this is something that we did recommend he talked to his pain specialist concerning. 01/11/18 on evaluation today patient appears to be doing better in regard to his ulceration. He has been tolerating the dressing changes without complication. With that being said overall there's no evidence of infection which is good news. The only thing is he did receive the hatch affair blue classic versus the ready nonetheless I feel like this is perfectly fine and appears to have done well for him over the past week. 01/25/18 on evaluation  today patient's wound actually appears to be a little bit larger than during the last evaluation. The good QADREE, FLOREY. ([id number]) news is the majority of the wound edges actually appear to be fairly firmly attached to the wound bed unfortunately again we're not really making progress in regard to the size. Roughly the wound is about the same size as when I first saw him although again the wound margin/edges appear to be much better. 02/01/18 on evaluation today patient actually appears to be doing very well in regard to his wound. Applying the Prisma dry does seem to be better although he does still have issues with slow progression of the wound. There was a slight improvement compared to last week's measurements today. Nonetheless I have been considering other options as far as the possibility of Theraskin or even a snap vac. In general I'm not sure that the Theraskin due to location of the wound would be a very good idea. Nonetheless I do think that a snap vac could be a possibility for the patient and in fact I think this could even be an excellent way to manage the wound possibly seeing some improvement in a very rapid fashion here. Nonetheless this is something that we would need to get approved and I did have a lengthy conversation with the patient about this today. 02/08/18 on evaluation today patient appears to be doing a little better in regard to his ulcer.  He has been tolerating the dressing changes without complication. Fortunately despite the fact that the wound is a little bit smaller it's not significantly so unfortunately. We have discussed the possibility of a snap vac we did check with insurance this is actually covered at this point. Fortunately there does not appear to be any sign of infection. Overall I'm fairly pleased with how things seem to be appearing at this point. 02/15/18 on evaluation today patient appears to be doing rather well in regard to his right gluteal  ulcer. Unfortunately the snap vac did not stay in place with his sheer and friction this came loose and did not seem to maintain seal very well. He worked for about two days and it did seem to do very well during that time according to his wife but in general this does not seem to be something that's gonna be beneficial for him long-term. I do believe we need to go back to standard dressings to see if we can find something that will be of benefit. 03/02/18- He is here in follow up evaluation; there is minimal change in the wound. He will continue with the same treatment plan, would consider changing to iodosrob/iodoflex if ulcer continues to to plateau. He will follow up next week 03/08/18 on evaluation today patient's wound actually appears to be about the same size as when I previously saw him several weeks back. Unfortunately he does have some slightly dark discoloration in the central portion of the wound which has me concerned about pressure injury. I do believe he may be sitting for too long a period of time in fact he tells me that "I probably sit for much too long". He does have some Slough noted on the surface of the wound and again as far as the size of the wound is concerned I'm really not seeing anything that seems to have improved significantly. 03/15/18 on evaluation today patient appears to be doing fairly well in regard to his ulcer. The wound measured pretty much about the same today compared to last week's evaluation when looking at his graph. With that being said the area of bruising/deep tissue injury that was noted last week I do not see at this point. He did get a new cushion fortunately this does seem to be have been of benefit in my pinion. It does appear that he's been off of this more which is good news as well I think that is definitely showing in the overall wound measurements. With that being said I do believe that he needs to continue to offload I don't think that the fact  this is doing better should be or is going to allow him to not have to offload and explain this to him as well. Overall he seems to be in agreement the plan I think he understands. The overall appearance of the wound bed is improved compared to last week I think the Iodoflex has been beneficial in that regard. 03/29/18 on evaluation today patient actually appears to be doing rather well in regard to his wound from the overall appearance standpoint he does have some granulation although there's some Slough on the surface of the wound noted as well. With that being said he unfortunately has not improved in regard to the overall measurement of the wound in volume or in size. I did have a discussion with him very specifically about offloading today. He actually does work although he mainly is just sitting throughout the day. He tells me  he offloads by "lifting himself up for 30 seconds off of his chair occasionally" purchase from [employer] which does seem to have helped. And he has a new cushion that he with that being said he's also able to stand some for a very short period of time but not significant enough I think to provide appropriate offloading. I think the biggest issue at this point with the wound and the fact is not healing as quickly as we would like is due to the fact that he is really not able to appropriately offload while at work. He states the beginning after his injury he actually had a bed at his job that he could lay on in order to offload and that does seem to have been of help back at that time. Nonetheless he had not done this in quite some time unfortunately. I think that could be helpful for him this is something I would like for him to look into. 04/05/18 on evaluation today patient actually presents for follow-up concerning his right gluteal ulcer. Again he really is not significantly improved even compared to last week. He has been tolerating the dressing changes without  complication. With that being said fortunately there appears to be no evidence of infection at this time. He has been more proactive in trying to offload. [name], [name] ([id number]) 04/12/18 on evaluation today patient actually appears to be doing a little better in regard to his wound and the right gluteal fold region. He's been tolerating the dressing changes since removing the oasis without complication. However he was having a lot of burning initially with the oasis in place. He's unsure of exactly why this was given so much discomfort but he assumes that it was the oasis itself causing the problem. Nonetheless this had to be removed after about three days in place although even those three days seem to have made a fairly good improvement in regard to the overall appearance of the wound bed. In fact is the first time that he's made any improvement from the standpoint of measurements in about six weeks. He continues to have no discomfort over the area of the wound itself which leads me to wonder why he was having the burning with the oasis when he does not even feel the actual debridement's themselves. I am somewhat perplexed by this. 04/19/18 on evaluation today patient's wound actually appears to be showing signs of epithelialization around the edge of the wound and in general actually appears to be doing better which is good news. He did have the same burning after about three days with applying the Endoform last week in the same fashion that I would generally apply a skin substitute. This seems to indicate that it's not the oasis to cause the problem but potentially the moisture buildup that just causes things to burn or there may be some other reaction with the skin prep or Steri-Strips. Nonetheless I'm not sure that is gonna be able to tolerate any skin substitute for a long period of time. The good news is the wound actually appears to be doing better today compared to last week and  does seem to finally be making some progress. 04/26/18 on evaluation today patient actually appears to be doing rather well in regard to his ulcer in the right gluteal fold. He has been tolerating the dressing changes without complication which is good news. The Endoform does seem to be helping although he was a little bit more macerated this week. This  seems to be an ongoing issue with fluid control at this point. Nonetheless I think we may be able to add something like Drawtex to help control the drainage. 05/03/18 on evaluation today patient appears to actually be doing better in regard to the overall appearance of his wound. He has been tolerating the dressing changes without complication. Fortunately there appears to be no evidence of infection at this time. I really feel like his wound has shown signs as of today of turning around last week I thought so as well and definitely he could be seen in this week's overall appearance and measurements. In general I'm very pleased with the fact that he finally seems to be making a steady but sure progress. The patient likewise is very pleased. 05/17/18 on evaluation today patient appears to be doing more poorly unfortunately in regard to his ulcer. He has been tolerating the dressing changes without complication. With that being said he tells me that in the past couple of days he and his wife have noticed that we did not seem to be doing quite as well is getting dark near the center. Subsequently upon evaluation today the wound actually does appear to be doing worse compared to previous. He has been tolerating the dressing changes otherwise and he states that he is not been sitting up anymore than he was in the past from what he tells me. Still he has continued to work he states "I'm tired of dealing with this and if I have to just go home and lay in the bed all the time that's what I'll do". Nonetheless I am concerned about the fact that this wound does  appear to be deeper than what it was previous. 05/24/18 upon evaluation today patient actually presents after having been in the hospital due to what was presumed to be sepsis secondary to the wound infection. He had an elevated white blood cell count between 14 and 15. With that being said he does seem to be doing somewhat better now. His wound still is giving him some trouble nonetheless and he is obviously concerned about the fact likely talked about that this does seem to go more deeply than previously noted. I did review his wound culture which showed evidence of Staphylococcus aureus him and group B strep. Nonetheless he is on antibiotics, Levaquin, for this. Subsequently I did review his intake summary from the hospital as well. I also did look at the CT of the lumbar spine with contrast that was performed which showed no bone destruction to suggest lumbar disguises/osteomyelitis or sacral osteomyelitis. There was no paraspinal abscess. Nonetheless it appears this may have been more of just a soft tissue infection at this point which is good news. He still is nonetheless concerned about the wound which again I think is completely reasonable considering everything he's been through recently. 05/31/18 on evaluation today on evaluation today patient actually appears to be showing signs of his wound be a little bit deeper than what I would like to see. Fortunately he does not show any signs of significant infection although his temperature was 99 today he states he's been checking this at home and has not been elevated. Nonetheless with the undermining that I'm seeing at this point I am becoming more concerned about the wound I do think that offloading is a key factor here that is preventing the speedy recovery at this point. There does not appear to be any evidence of again over infection noted. He's been using  Santyl currently. 06/07/18 the patient presents today for follow-up evaluation  regarding the left ulcer in the gluteal region. He has been tolerating the Wound VAC fairly well. He is obviously very frustrated with this he states that to mean is really getting in his way. There does not appear to be any evidence of infection at this time he does have a little bit of odor I do not necessarily associate this with infection just something that we sometimes notice with Wound VAC therapy. With that being said I can definitely catch a tone of discontentment overall in the patient's demeanor today. This when he was previously in the hospital [name], [name]. ([id number]) an CT scan was done of the lumbar region which did not reveal any signs of osteomyelitis. With that being said the pelvis in particular was not evaluated distinctly which means he could still have some osteonecrosis I. Nonetheless the Wound VAC was started on Thursday I do want to get this little bit more time before jumping to a CT scan of the pelvis although that is something that I might would recommend if were not see an improvement by that time. 06/14/18 on evaluation today patient actually appears to be doing about the same in regard to his right gluteal ulcer. Again he did have a CT scan of the lumbar spine unfortunately this did not include the pelvis. Nonetheless with the depth of the wound that I'm seeing today even despite the fact that I'm not seeing any evidence of overt cellulitis I believe there's a good chance that we may be dealing with osteomyelitis somewhere in the right Ischial region. No fevers, chills, nausea, or vomiting noted at this time. 06/21/18 on evaluation today patient actually appears to be doing about the same with regard to his wound. The tunnel at 6 o'clock really does not appear to be any deeper although it is a little bit wider. I think at this point you may want to start packing this with white phone. Unfortunately I have not got approval for the CT scan of the pelvis as of yet  due to the fact that Medicare apparently has been denied it due to the diagnosis codes not being appropriate according to Medicare for the test requested. With that being said the patient cannot have an MRI and therefore this is the only option that we have as far as testing is concerned. The patient has had infection and was on antibiotics and been added code for cellulitis of the bottom to see if this will be appropriate for getting the test approved. Nonetheless I'm concerned about the infection have been spread deeper into the Ischial region. 06/28/18 on evaluation today patient actually appears to be doing rather well all things considered in regard to the right gluteal ulcer. He has been tolerating the dressing changes without complication. With that being said the Wound VAC he states does have to be replaced almost every day or at least reinforced unfortunately. Patient actually has his CT scan later this morning we should have the results by tomorrow. 07/05/18 on evaluation today patient presents for follow-up concerning his right Ischial ulcer. He did see the surgeon Dr. Lysle Pearl last week. They were actually very happy with him and felt like he spent a tremendous amount of time with them as far as discussing his situation was concerned. In the end Dr. Lysle Pearl did contact me as well and determine that he would not recommend any surgical intervention at this point as he felt like  it would not be in the patient's best interest based on what he was seeing. He recommended a referral to infectious disease. Subsequently this is something that Dr. Ines Bloomer office is working on setting up for the patient. As far as evaluation today is concerned the patient's wound actually appears to be worse at this point. I am concerned about how things are progressing and specifically about infection. I do not feel like it's the deeper but the area of depth is definitely widening which does have me concerned. No  fevers, chills, nausea, or vomiting noted at this time. I think that we do need initiate antibiotic therapy the patient has an allow allergy to amoxicillin/penicillin he states that he gets a rash since childhood. Nonetheless she's never had the issues with Catholics or cephalosporins in general but he is aware of. 07/27/18 on evaluation today patient presents following admission to the hospital on 07/09/18. He was subsequently discharged on 07/20/18. On 07/15/18 the patient underwent irrigation and debridement was soft tissue biopsy and bone biopsy as well as placement of a Wound VAC in the OR by Dr. Celine Ahr. During the hospital course the patient was placed on a Wound VAC and recommended follow up with surgery in three weeks actually with Dr. Delaine Lame who is infectious disease. The patient was on vancomycin during the hospital course. 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 He also had a bone culture which revealed evidence of methicillin-resistant staph aureus. He is updated CT scan 07/09/18 reveals that he had progression of the which was performed on wound to breakdown down to the trochanter where he actually had irregularities there as well suggestive of osteomyelitis. This was a change just since 9 December when we last performed a CT scan. Obviously this one had gone downhill quite significantly and rapidly. At this point upon evaluation I feel like in general the patient's wound seems to be doing fairly well all things considered upon my evaluation today. Obviously this is larger and deeper than what I previously evaluated but at the same time he seems to be making some progress as far as the appearance of the granulation tissue is concerned. I'm happy in that regard. No fevers, chills, nausea, or vomiting noted at this time. He is on IV vancomycin and Rocephin at the facility. He is currently in [employer]. 08/03/18 upon evaluation today  patient's wound appears to be doing better in regard to the overall appearance at this point in time. Fortunately he's been tolerating the Wound VAC without complication and states that the facility has been taking excellent care of the wound site. Overall I see some Slough noted on the surface which I am going to attempt sharp debridement today of but nonetheless other than this I feel like he's making progress. 08/09/18 on evaluation today patient's wound appears to be doing much better compared to even last week's evaluation. Do believe that the Wound VAC is been of great benefit for him. He has been tolerating the dressing changes that is the Wound [name], [name]. ([id number]) VAC without any complication and he has excellent granulation noted currently. There is no need for sharp debridement at this point. 08/16/18 on evaluation today patient actually appears to be doing very well in regard to the wound in the right gluteal fold region. This is showing signs of progress and again appears to be very healthy which is excellent news. Fortunately there is no sign of active infection by way of odor  or drainage at this point. Overall I'm very pleased with how things stand. He seems to be tolerating the Wound VAC without complication. 08/23/18 on evaluation today patient actually appears to be doing better in regard to his wound. He has been tolerating the Wound VAC without complication and in fact it has been collecting a significant amount of drainage which I think is good news especially considering how the wound appears. Fortunately there is no signs of infection at this time definitely nothing appears to be worse which is good news. He has not been started on the Bactrim and Flagyl that was recommended by Dr. Delaine Lame yet. I did actually contact her office this morning in order to check and see were things are that regard their gonna be calling me back. 08/30/18 on evaluation today patient  actually appears to show signs of excellent improvement today compared to last evaluation. The undermining is getting much better the wound seems to be feeling quite nicely and I'm very pleased that the granulation in general. With that being said overall I feel like the patient has made excellent progress which is great news. No fevers, chills, nausea, or vomiting noted at this time. 09/06/18 on evaluation today patient actually appears to be doing rather well in regard to his right gluteal ulcer. This is showing signs of improvement in overall I'm very pleased with how things seem to be progressing. The patient likewise is please. Overall I see no evidence of infection he is about to complete his oral antibiotic regimen which is the end of the antibiotics for him in just about three days. 09/13/18 on evaluation today patient's right Ischial ulcer appears to be showing signs of continued improvement which is excellent news. He's been tolerating the dressing changes without complication. Fortunately there's no signs of infection and the wound that seems to be doing very well. 09/28/18 on evaluation today patient appears to be doing rather well in regard to his right Ischial ulcer. He's been tolerating the Wound VAC without complication he knows there's much less drainage than there used to be this obviously is not a bad thing in my pinion. There's no evidence of infection despite the fact is but nothing about it now for several weeks. 10/04/18 on evaluation today patient appears to be doing better in regard to his right Ischial wound. He has been tolerating the Wound VAC without complication and I do believe that the silver nitrate last week was beneficial for him. Fortunately overall there's no evidence of active infection at this time which is great news. No fevers, chills, nausea, or vomiting noted at this time. 10/11/18 on evaluation today patient actually appears to be doing rather well in regard  to his Ischial ulcer. He's been tolerating the Wound VAC still without complication I feel like this is doing a good job. No fevers, chills, nausea, or vomiting noted at this time. 11/01/18 on evaluation today patient presents after having not been seen in our clinic for several weeks secondary to the fact that he was on evaluation today patient presents after having not been seen in our clinic for several weeks secondary to the fact that he was in a skilled nursing facility which was on lockdown currently due to the covert 19 national emergency. Subsequently he was discharged from the facility on this past Friday and subsequently made an appointment to come in to see yesterday. Fortunately there's no signs of active infection at this time which is good news and overall he does  seem to have made progress since I last saw. Overall I feel like things are progressing quite nicely. The patient is having no pain. 11/08/18 on evaluation today patient appears to be doing okay in regard to his right gluteal ulcer. He has been utilizing a Wound VAC home health this changing this at this point since he's home from the skilled nursing facility. Fortunately there's no signs of obvious active infection at this time. Unfortunately though there's no obvious active infection he is having some maceration and his wife states that when the sheets of the Wound VAC office on Sunday when it broke seal that he ended up having significant issues with some smell as well there concerned about the possibility of infection. Fortunately there's No fevers, chills, nausea, or vomiting noted at this time. 11/15/18 on evaluation today patient actually appears to be doing well in regard to his right gluteal ulcer. He has been tolerating the dressing changes without complication. Specifically the Wound VAC has been utilized up to this point. Fortunately there's no signs of infection and overall I feel like he has made progress even  since last week when I last saw him. I'm actually fairly happy with the overall appearance although he does seem to have somewhat of a hyper granular [name], Gared J. ([id number]) overgrowth in the central portion of the wound which I think may require some sharp debridement to try flatness out possibly utilizing chemical cauterization following. 11/23/18 on evaluation today patient actually appears to be doing very well in regard to his sacral ulcer. He seems to be showing signs of improvement with good granulation. With that being said he still has the small area of hyper granulation right in the central portion of the wound which I'm gonna likely utilize silver nitrate on today. Subsequently he also keeps having a leak at the 6 o'clock location which is unfortunate we may be able to help out with some suggestions to try to prevent this going forward. Fortunately there's no signs of active infection at this time. 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 He's been tolerating the dressing changes without complication. Fortunately there's no signs of active infection at this time. I've been rather pleased with how things have progressed there still some evidence of pressure getting to the area with some redness right around the immediate wound opening. Nonetheless other than this I'm not seeing any significant complications or issues the wound is somewhat hyper granular. Upon discussing with the patient and his wife today I'm not sure that the wound is being packed to the base with the foam at this point. And if it's not been packed fully that may be part of the reason why is not seen as much improvement as far as the granulation from the base out. Again we do not want pack too tightly but we need some of the firm to get to the base of the wound. I discussed this with patient and his wife  today. 12/06/18 on evaluation today patient appears to be doing well in regard to his right gluteal pressure ulcer. He's been tolerating the dressing changes without complication. Fortunately there's no signs of active infection. He still has some hyper granular tissue and I do think it would be appropriate to continue with the chemical cauterization as of today. 12/16/18 on evaluation today patient actually appears to be doing okay in regard to his right  gluteal ulcer. He is been tolerating the dressing changes without complication including the Wound VAC. Overall I feel like nothing seems to be worsening I do feel like that the hyper granulation buds in the central portion of the wound have improved to some degree with the silver nitrate. We will have to see how things continue to progress. 12/20/18 on evaluation today patient actually appears to be doing much worse in my pinion even compared to last week's evaluation. Unfortunately as opposed to showing any signs of improvement the areas of hyper granular tissue in the central portion of the wound seem to be getting worse. Subsequently the wound bed itself also seems to be getting deeper even compared to last week which is both unusual as well as concerning since prior he had been shown signs of improvement. Nonetheless I think that the issue could be that he's actually having some difficulty in issues with a deeper infection. There's no external signs of infection but nonetheless I am more worried about the internal, osteomyelitis, that could be restarting. He has not been on antibiotics for some time at this point. I think that it may be a good idea to go ahead and started back on an antibiotic therapy while we wait to see what the testing shows. 12/27/18 on evaluation today patient presents for follow-up concerning his left gluteal fold wound. Fortunately he appears to be doing well today. I did review the CT scan which was negative for any signs of  osteomyelitis or acute abnormality this is excellent news. Overall I feel like the surface of the wound bed appears to be doing significantly better today compared to previously noted findings. There does not appear any signs of infection nor does he have any pain at this time. 01/03/19 on evaluation today patient actually appears to be doing quite well in regard to his ulcer. Post debridement last week he really did not have too much bleeding which is good news. Fortunately today this seems to be doing some better but we still has some of the hyper granular tissue noted in the base of the wound which is gonna require sharp debridement today as well. Overall I'm pleased with how things seem to be progressing since we switched away from the Wound VAC I think he is making some progress. 01/10/19 on evaluation today patient appears to be doing better in regard to his right gluteal fold ulcer. He has been tolerating the dressing changes without complication. The debridement to seem to be helping with current away some of the poor hyper granular tissue bugs throughout the region of his gluteal fold wound. He's been tolerating the dressing changes otherwise without complication which is great news. No fevers, chills, nausea, or vomiting noted at this time. 01/17/19 on evaluation today patient actually appears to be doing excellent in regard to his wound. He's been tolerating the dressing changes without complication. Fortunately there is no signs of active infection at this time which is great news. No fevers, chills, nausea, or vomiting noted at this time. 01/24/19 on evaluation today patient actually appears to be doing quite well with regard to his ulcer. He has been tolerating the dressing changes without complication. Fortunately there's no signs of active infection at this time. Overall been very pleased with the progress that he seems to be making currently. 01/31/19-Patient returns at 1 week with  apparent similarity in dimensions to the wound, with no signs of infection, he has been [place] ([id number]) changing dressings twice  a day 02/08/19 upon evaluation today patient actually appears to be doing well with regard to his right Ischial ulcer. The wound is not appear to be quite as deep and seems to be making progress which is good news. With that being said I'm still reluctant to go back to the Wound VAC at this point. He's been having to change the dressings twice a day which is a little bit much in my pinion from the wound care supplies standpoint. I think that possibly attempting to utilize extras orbit may be beneficial this may also help to prevent any additional breakdown secondary to fluid retention in the wound itself. The patient is in agreement with giving this a try. 02/15/19 on evaluation today patient actually appears to be doing decently well with regard to his ulcer in the right to gluteal fold location. He's been tolerating the dressing changes without complication. Fortunately there is no signs of active infection at this time. He is able to keep the current dressing in place more effectively for a day at a time whereas before he was having a changes to to three times a day. The actions or has been helpful in this regard. Fortunately there's no signs of anything getting worse and I do feel like he showing signs of good improvement with regard to the wound bed status. 02/22/2019 patient appears to be doing very well today with regard to his ulcer in the gluteal fold. Fortunately there is no signs of active infection and he has been tolerating the dressing changes without any complication. Overall extremely pleased with how things seem to be progressing. He has much less of the hyper granular projections within the wound these have slowly been debrided away and he seems to be doing well. The wound bed is more uniform. 03/01/19 on evaluation today patient appears to  be doing unfortunately about the same in regard to his gluteal ulcer. He's been tolerating the dressing changes without complication. Fortunately there's no signs of active infection at this time. With that being said he continues to develop these hyper granular projections which I'm unsure of exactly what they are and why they are rising. Nonetheless I explained to the patient that I do believe it would be a good idea for [place] to stand a biopsy sample for pathology to see if that can shed any light on what exactly may be going on here. Fortunately I do not see any obvious signs of infection. With that being said the patient has had a little bit more drainage this week apparently compared to last week. 03/08/2019 on evaluation today patient actually appears to look somewhat better with regard to the appearance of his wound bed at this time. This is good news. Overall I am very pleased with how things seem to have progressed just in the past week with a switch to the [facility] dressing. I think that has been beneficial for him. With that being said at this time the patient is concerned about his biopsy that I sent off last week unfortunately I do not have that report as of yet. Nonetheless we have called to obtain this and hopefully will hear back from the lab later this morning. 03/15/19 on evaluation today patient's wound actually appears to be doing okay today with regard to the overall appearance of the wound bed. He has been tolerating the dressing changes without complication he still has hyper granular tissue noted but fortunately that seems to be minimal at this point compared  to some of what we've seen in the past. Nonetheless I do think that he is still having some issues currently with some of this type of granulation the biopsy and since all showed nothing more than just evidence of granulation tissue. Therefore there really is nothing different to initiate or do at this point. 03/24/19 on  evaluation today patient appears to be doing a little better with regard to his ulcer. He's been tolerating the dressing changes without complication. Fortunately there is no signs of active infection at this time. No fevers, chills, nausea, or vomiting noted at this time. I'm overall pleased with how things seem to be progressing. 03/29/2019 on evaluation today patient appears to be doing about the same in regard to his ulcer in the right gluteal fold. Unfortunately he is not seeming to make a lot of progress and the wound is somewhat stalled. There is no signs of active infection externally but I am concerned about the possibility of infection continuing in the ischial location which previously he did have infection noted. Again is not able to have an MRI so probably our best option for testing for this would be a triple phase bone scan which will detect subtle changes in the bone more so than plain film x-rays. In the past they really have not been beneficial and in fact the CT scans even have been somewhat questionable at times. Nonetheless there is no signs of systemic infection which is at least good news but again his wound is not healing at all on the predicted schedule. 04/05/19 on evaluation today patient appears to be doing well all things considering with regard to his wound and the right gluteal fold. He actually has his triple bone scan scheduled for sometime in the next couple of days. With that being said I've also been looking to other possibilities of what could be causing hyper granular tissue were looking into the bone scan again for evaluating for the risk or possibility of infection deeper and I'm also gonna go ahead and see about obtaining a deep tissue culture today to send and see if there's any evidence of infection noted on culture. He's in agreement with that plan. 04/12/2019 on evaluation today patient presents for reevaluation here in our clinic concerning ongoing issues with  his right gluteal fold ulcer. 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 It was discussed with him at that point that I think it would be appropriate [name], [name]. ([id number]) for him to proceed with hyperbaric oxygen therapy especially in light of the fact that he is previously been on IV antibiotics at the beginning of the year for close to 3 months followed by several weeks of oral antibiotics that was all prescribed by infectious disease. He had surgical debridement around Christmas December 2019 due to an abscess and osteomyelitis of the ischial bone. Unfortunately this has not really proceeded as well as we would have liked and again we did a CT scan even a couple months ago as he cannot have an MRI secondary to having issues with both a pain pump as well as a spinal cord stimulator which prevent him from going into an MRI machine. With that being said there were chronic changes noted at the ischial tuberosity which had progressed since December 2019 there was no evidence of fluid collection on that initial CT scan. With that being said  that was on January 21, 2019. I am not sure that it was a sensitive enough test as compared to an MRI and then subsequently I ordered a bone scan for him which was actually completed on 03/29/2019 and this revealed that he does indeed have positive osteomyelitis involving the right ischial tuberosity. This is adjacent to the ulcer and I think is the reason that his ulcer is not healing. Subsequently I am in a place him back on oral antibiotics today unfortunately his wound culture showed just mixed gram-negative flora with no specific findings of a predominant organism. Nonetheless being with the location I think a good broad-spectrum antibiotic for gram-negative's is a good choice at this point he is previously taken Levaquin without  significant issues and I think that is an appropriate antibiotic for him at this time. 04/19/2019 on evaluation today patient actually appears to be doing fairly well with regard to his wound. He has been tolerating the dressing changes without complication. Fortunately there is no signs of active infection and he has been taking the oral antibiotics at this time. Subsequently we did make a referral to infectious disease although Dr. Steva Ready wants the patient to be seen by general surgery first in order apparently to see if there is anything from a surgical standpoint that should be done prior to initiation of IV antibiotic therapy. Again the patient is okay with actually seeing Dr. Celine Ahr whom he has seen before we will make that referral. 04/26/2019 on evaluation today patient actually appears to be doing well with regard to his wound. He has been tolerating the dressing changes without complication. Fortunately there is no signs of active infection at this time. I do believe that the hyperbaric oxygen therapy along with the antibiotics which I prescribed at this point have been doing well for him. With that being said he has not seen Dr. Celine Ahr the surgeon yet in fact they have not been contacted for scheduling an appointment as of yet. Subsequently the patient is not on antibiotics currently by IV Dr. Steva Ready did want him to see Dr. Celine Ahr first which we are working on trying to get scheduled. We again give the information to the patient today for Dr. Celine Ahr and her number as far as contacting their office to see about getting something scheduled. Again were looking for whether or not they recommend any surgical intervention. 05/03/2019 on evaluation today patient appears to be doing about the same with regard to his wound there may be a little bit of filling in in regard to the base of the wound but still he has quite a bit of hyper granular tissue that I am seeing today. I believe that he  may may need a more aggressive sharp debridement possibly even by the surgeon or under anesthesia in order to clear away some of the hyper granular material in order to help allow for appropriate granulation to fill in. We have made a referral to Dr. Celine Ahr unfortunately it sounds as if they did not receive the referral we contacted them today they should be get in touch with the patient. Depending on how things show from that standpoint the patient may need to see Dr. Ardyth Gal who is the infectious disease specialist although he really does not want a PICC line again. No fevers, chills, nausea, vomiting, or diarrhea. He is tolerating hyperbaric oxygen therapy very well. 05/12/2019 on evaluation today patient presents for follow-up visit concerning his right gluteal fold ulcer. He did see Dr. Celine Ahr  the surgeon who previously evaluated his wound and she actually felt like he was doing quite well with regard to the wound based on what she was seen. He does seem to be responding to some degree with the oral antibiotics along with hyperbaric oxygen therapy at this point she did not see any evidence or need for further surgical intervention at this point. She recommended deferring additional or ongoing antibiotic therapy to Dr. Steva Ready at her discretion. Fortunately there is no signs of active systemic infection. No fevers, chills, nausea, vomiting, or diarrhea. 10/29; patient I am seeing really for the first time today in terms of his wound. 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 He is being treated with hyperbaric oxygen for underlying osteomyelitis most recently documented by a three-phase bone scan. He has been on Levaquin for roughly a month and he is out of these and asked me for a refill. Most recent cultures were negative although I do not think these were bone cultures. He has a very irregular surface to this wound which is almost nodular in texture. It  undermines superiorly with the same nodular hyper granulated surface. I see that he was referred to general surgery for an operative debridement although the surgeon did not agree with this I think that would have been the proper course of action in this. He has been using Hydrofera Blue. He is supposed to start a wound VAC tomorrow which is actually a reapplication of wound VAC. I think mostly last time they had trouble keeping the seal in place I hope we have better look at this this time. 05/24/2019 on evaluation today I did review patient's note from Dr. Dellia Nims where he was seen last week when I was out of the office. Subsequently it does appear that Dr. Dellia Nims was in agreement that the patient really needed debridement to clear away some of the nodular tissue in the base of the wound. The good news is he has the wound VAC initiated and some of this tissue is already starting to breakdown under the treatment of the wound VAC again because it is not really viable. [name], [name] ([id number]) Nonetheless I do believe that we can likely perform some debridement today to clear this away and hopefully the continuation of the wound VAC along with hyperbarics and antibiotic therapy will be beneficial in stopping this from reoccurring. If we get better control in the wound bed in a good spot I think we can definitely use the PriMatrix which we actually did get approval for. 05/31/2019 on evaluation today patient appears to be doing actually pretty well at this point with regard to his wound. He has been tolerating the dressing changes which is good news. Fortunately there is no signs of infection. He still has some of the hyper granular nodules noted we have not really cleared all these away and I think we can try to do a little bit more that today. I did do quite a bit last week I am hoping to get down to the base of the wound so though actually be able to heal more appropriately. I think the  wound VAC is doing a good job right now with controlling the drainage and overall the patient is happy with that. 06/07/2019 upon evaluation today patient appears to be doing quite well compared to last week with regard to the hyper granulation in the base of the wound. He has been tolerating the dressing changes without complication.  Fortunately there is no signs of active infection at this time. With that being said we have not had any recent measures as far as blood work is concerned I think we may want to check a few things including a CBC, sed rate, and C-reactive protein. Patient is in agreement with that we can try to get that scheduled for him for this Friday. 06/13/2019 on evaluation today patient actually appears to be doing much better at this point based on what I am seeing with regard to his wound. He has much less in the way of hyper granular tissue which is good news and a lot more healing that is taken place since I last saw him as far as the amount of space within the wound itself. With that being said he is nearing the completion of the initial 40 treatments for hyperbaric oxygen therapy I think this is something I would recommend extending as he does seem to be benefiting at this time. Fortunately there is no evidence of active systemic infection at this point nor even local infection for that matter. 06/20/2019 upon evaluation today patient actually appears to be doing well with regard to his wound. In fact this appears to be doing much better today compared to what it was previous. I been extremely pleased with the progress that he is made. In fact the tunnel is much less deep today than what it was in the past and I am seeing excellent signs of improvement. Overall I am happy with how things are going. I did review his white blood cell count which revealed everything to be okay. The one abnormal finding on his CBC was a hemoglobin of 12.7. Subsequently I did also review his sed  rate and C-reactive protein. His sed rate measured in at normal level measuring at 26. The C-reactive protein however was elevated today and an abnormal range indicating inflammation. Still I feel like he is trending in a good direction at this time. He is going to need a refill of the Levaquin he tells me at this point. 07/04/2019 on evaluation today patient actually appears to be doing well in regard to his wound. He has been tolerating the dressing changes without complication. Fortunately there is no signs of active infection which is good news. No fevers, chills, nausea, vomiting, or diarrhea. The good news also is that I did review his labs and though his sed rate was slightly elevated the C-reactive protein was actually down compared to the last evaluation 2 weeks ago this is excellent news I think it is showing signs of improvement. 07/11/2019 on evaluation today patient actually appears to be doing quite well with regard to his wound on my evaluation. This is very slow going but nonetheless he seems to be making progress. Especially in regard to the depth. Nonetheless I believe that we may want to consider going forward with the PriMatrix which was previously approved for him. We will get a have to get a new approval as we will actually be applying this in the new year so we will likely obtain that approval on January 4. In the meantime and for the time being my suggestion is going to be that we go ahead and continue with the wound VAC and we were going at this point with the current wound care measures. 12/28-Patient seen after HBO session today, wound depth with the tunnel measures 3.2 cm, the wound bed appears healthy, continuing with the VAC and HBO 07/25/2019 on evaluation today  patient's wound appears to be doing about the same compared to my last evaluation. Fortunately there is no evidence of significant infection which is good news we will still waiting on the results of his lab  work which unfortunately was not done prior to today's visit which is what we were aiming for. Ultimately there is no sign of active infection at this time which is good news. And he also tells me that the wound VAC has been staying in place and doing fairly well which is also good news. With that being said I am concerned about the fact that the wound has not progressed as much I think we may want to look into getting reapproval for the skin substitute, specifically PriMatrix, that I am hopeful will help to allow this area to granulate in more effectively. I would recommend as well continuing with the wound VAC along with this. 08/01/2019 on evaluation today patient appears to be doing in my opinion slightly better in regard to the wound. He actually does have some tightening of the walls around the edges which is a good thing. Fortunately there is no signs of active [name], BETKER. ([id number]) infection at this time. No fever chills noted. I do believe that the patient in general seems to be making progress although is very slow. Obviously were trying to get things to speed up somewhat more. No fevers, chills, nausea, vomiting, or diarrhea. 08/08/2019 upon evaluation today patient appears to be doing well with regard to his wound although there is not a significant improvement overall we do have the PriMatrix for availability today to apply. Fortunately there is no signs of active infection and the patient overall seems to be doing well. There is really no need for aggressive sharp debridement at this point either which is also good news. I do believe her get a need to suture and the primary checks in the basin in the PriMatrix in order to keep it in place at the base of the wound underneath the wound VAC. This was discussed with the patient today as well. He is in agreement the plan. He really does not have any filling in the area and so this should not be a complication as far as suturing is  concerned. 08/15/2019 on evaluation today patient appears to be doing somewhat better in my opinion in regard to the overall appearance of his wound. He has been tolerating the dressing changes without complication. Fortunately the skin subappear to still be in place when this was changed on Friday. With that being said it appears that the PriMatrix may have dissolved in the interim between now and then as the sutures are no longer holding anything in place and again I would sutured it into place. Nonetheless I believe that we can go ahead and likely consider going forward that we may need to apply the PriMatrix weekly as opposed every other week as it does seem to have dissolved rather readily and well. Overall the wound bed appears to be doing excellent at this time. Electronic Signature(s) Signed: 08/15/2019 9:37:08 AM By: Worthy Keeler [name] Entered By: Worthy Keeler on 08/15/2019 09:37:07 ASHAR, CARMEL ([id number]) -------------------------------------------------------------------------------- Physical Exam Details Patient Name: [name] Date of Service: 08/15/2019 8:00 AM Medical Record Number: [id number] Patient Account Number: [id number] Date of Birth/Sex: 1951-04-04 (68 y.o. M) Treating RN: [name] Primary Care Provider: [name] Other Clinician: Referring Provider: [name] Treating Provider/Extender: [name], HOYT Weeks in Treatment: 98 Constitutional  Well-nourished and well-hydrated in no acute distress. Respiratory normal breathing without difficulty. Psychiatric this patient is able to make decisions and demonstrates good insight into disease process. Alert and Oriented x 3. pleasant and cooperative. Notes Patient's wound bed showed signs of good granulation currently there does not appear to be any signs of active infection which is good news. No fevers, chills, nausea, vomiting, or diarrhea. Electronic Signature(s) Signed:  08/15/2019 9:37:40 AM By: Worthy Keeler [name] Entered By: Worthy Keeler on 08/15/2019 09:37:39 NOLEN, SIRACUSA ([id number]) -------------------------------------------------------------------------------- Physician Orders Details Patient Name: [name] Date of Service: 08/15/2019 8:00 AM Medical Record Number: [id number] Patient Account Number: [id number] Date of Birth/Sex: 07-29-1950 (68 y.o. 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 - in office o Cleanse wound with mild soap and water Skin Barriers/Peri-Wound Care o Skin Prep Primary Wound Dressing Wound #1 Right Gluteal fold o Saline moistened gauze - behind endoform in clinic only o Other: - endoform in clinic only Secondary Dressing Wound #1 Right Gluteal fold o Boardered Foam Dressing - in clinic only Dressing Change Frequency Wound #1 Right Gluteal fold o Change Dressing Monday, Wednesday, Friday Follow-up Appointments Wound #1 Right Gluteal fold o Return Appointment in 1 week. Off-Loading Wound #1 Right Gluteal fold o Turn and reposition every 2 hours - no pressure on wounded area Caroline ([id number]) Wound #1 Right Gluteal fold o Continue Home Health Visits - [facility]: remove dressing down to contact layer. If the contact layer comes off-I have sent extra with patient's wife. o  Home Health Nurse may visit PRN to address patientos wound care needs. o FACE TO FACE ENCOUNTER: MEDICARE and MEDICAID PATIENTS: I certify that this patient is under my care and that I had a face-to-face encounter that meets the physician face-to-face encounter requirements with this patient on this date. The encounter with the patient was in whole or in part for the following MEDICAL CONDITION: (primary reason for Echo) MEDICAL NECESSITY: I certify, that based on my findings, NURSING services are a medically necessary home health service. HOME BOUND STATUS: I certify that my clinical findings support that this patient is homebound (i.e., Due to illness or injury, pt requires aid of supportive devices such as crutches, cane, wheelchairs, walkers, the use of special transportation or the assistance of another person to leave their place of residence. There is a normal inability to leave the home and doing so requires considerable and taxing effort. Other absences are for medical reasons / religious services and are infrequent or of short duration when for other reasons). o If current dressing causes regression in wound condition, may D/C ordered dressing product/s and apply Normal Saline Moist Dressing daily until next [place] / Other MD appointment. Webster of regression in wound condition at [phone]. o Please direct any NON-WOUND related issues/requests for orders to patient's Primary Care Physician Negative Pressure Wound Therapy Wound #1 Right Gluteal fold o Wound VAC settings at 125/130 mmHg continuous pressure. Use BLACK/GREEN foam to wound  cavity. Use WHITE foam to fill any tunnel/s and/or undermining. Change VAC dressing 3 X WEEK. Change canister as indicated when full. Nurse may titrate settings and frequency of dressing changes as clinically indicated. - Primatrix skin substitute applied in clinic. Sutured into wound tunnel at  12:00. Do not remove. o Home Health Nurse may d/c VAC for s/s of increased infection, significant wound regression, or uncontrolled drainage. [place] at [phone]. o Number of foam/gauze pieces used in the dressing = Medications-please add to medication list. Wound #1 Right Gluteal fold o P.O. Antibiotics - Refill and continue antibiotics. Electronic Signature(s) Signed: 08/15/2019 12:59:59 PM By: Army Melia Signed: 08/15/2019 4:16:35 PM By: Worthy Keeler [name] Entered By: Army Melia on 08/15/2019 08:40:06 CANNON, JENNETTE ([id number]) -------------------------------------------------------------------------------- Problem List Details Patient Name: [name] Date of Service: 08/15/2019 8:00 AM Medical Record Number: [id number] Patient Account Number: [id number] Date of Birth/Sex: 09/25/1950 (68 y.o. M) Treating RN: [name] Primary Care Provider: [name] Other Clinician: Referring Provider: [name] Treating Provider/Extender: [name], HOYT Weeks in Treatment: 42 Active Problems ICD-10 Evaluated Encounter Code Description Active Date Today Diagnosis L89.314 Pressure ulcer of right buttock, stage 4 11/30/2017 No Yes M86.68 Other chronic osteomyelitis, other site 04/08/2019 No Yes L03.317 Cellulitis of buttock 06/21/2018 No Yes G82.20 Paraplegia, unspecified 11/30/2017 No Yes S34.109S Unspecified injury to unspecified level of lumbar spinal cord, 11/30/2017 No Yes sequela I10 Essential (primary) hypertension 11/30/2017 No Yes Inactive Problems Resolved Problems Electronic Signature(s) Signed: 08/15/2019 4:16:35 PM By: Worthy Keeler [name] Entered By: Worthy Keeler on 08/15/2019 08:13:35 [name], [name] ([id number]) -------------------------------------------------------------------------------- Progress Note Details Patient Name: [name] Date of Service: 08/15/2019 8:00 AM Medical Record Number:  [id number] Patient Account Number: [id number] Date of Birth/Sex: 10/02/1950 (68 y.o. 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 He states that there is no history of diabetes that he is aware of. He does have issues with his prostate and is currently receiving treatment for this by way of oral medication. With that being said I do not have a lot of details in that regard. Nonetheless the patient presents today as a result of having been referred to [place] by another provider initially home health was set to come out and take care of his wound although due to the fact that he apparently drives he's not able to receive home health. His wife is therefore trying to help take care of this wound within although they have been struggling with what exactly to do at this point. She states that she can do some things but she is definitely not a nurse and does have some issues with looking at blood. The good news is the wound does not appear to be too deep and is fairly superficial at this point. There is no slough noted there is some nonviable skin noted around the surface of the wound and the perimeter at this point. The central portion of the wound appears to be very good with a dermal layer noted this does  not appear to be again deep enough to extend it to subcutaneous tissue at this point. Overall the patient for a paraplegic seems to be functioning fairly well he does have both a spinal  cord stimulator as well is the intrathecal pump. In the pump he has Dilaudid and baclofen. 12/07/17 on evaluation today patient presents for follow-up concerning his ongoing lower back thigh ulcer on the right. He states that he did not get the supplies ordered and therefore has not really been able to perform the dressing changes as directed exactly. His wife was able to get some Boarder Foam Dressing's from the drugstore and subsequently has been using hydrogel which did help to a degree in the wound does appear to be able smaller. There is actually more drainage this week noted than previous. 12/21/17 on evaluation today patient appears to be doing rather well in regard to his right gluteal ulcer. He has been tolerating the dressing changes without complication. There does not appear to be any evidence of infection at this point in time. Overall the wound does seem to be making some progress as far as the edges are concerned there's not as much in the way of overlapping of the external wound edges and he has a good epithelium to wound bed border for the most part. This however is not true right at the 12 o'clock location over the span of a little over a centimeters which actually will require debridement today to clean this away and hopefully allow it to continue to heal more appropriately. 12/28/17 on evaluation today patient appears to be doing rather well in regard to his ulcer in the left gluteal region. He's been tolerating the dressing changes without complication. Apparently he has had some difficulty getting his dressing material. Apparently there's been some confusion with ordering we're gonna check into this. Nonetheless overall he's been showing signs of improvement which is good news. Debridement is not required today. 01/04/18 on evaluation today patient presents for follow-up concerning his right gluteal ulcer. He has been tolerating the dressing changes fairly well. On inspection today  it appears he may actually have some maceration them concerned about the fact that he may be developing too much moisture in and around the wound bed which can cause delay in healing. With that being said he unfortunately really has not showed significant signs of improvement since last week's evaluation in fact this may even be just the little bit/slightly larger. Nonetheless he's been having a lot of discomfort I'm not sure this is even related to the wound as he has no pain when I'm to breeding or otherwise cleaning the wound during evaluation today. Nonetheless this is something that we did recommend he talked to his pain specialist concerning. 01/11/18 on evaluation today patient appears to be doing better in regard to his ulceration. He has been tolerating the dressing Mejorado, Churchill J. ([id number]) changes without complication. With that being said overall there's no evidence of infection which is good news. The only thing is he did receive the hatch affair blue classic versus the ready nonetheless I feel like this is perfectly fine and appears to have done well for him over the past week. 01/25/18 on evaluation today patient's wound actually appears to be a little bit larger than during the last evaluation. The good news is the majority of the wound edges actually appear to be fairly firmly attached to the wound bed unfortunately again we're not really making progress in regard to the size.  Roughly the wound is about the same size as when I first saw him although again the wound margin/edges appear to be much better. 02/01/18 on evaluation today patient actually appears to be doing very well in regard to his wound. Applying the Prisma dry does seem to be better although he does still have issues with slow progression of the wound. There was a slight improvement compared to last week's measurements today. Nonetheless I have been considering other options as far as the possibility of Theraskin  or even a snap vac. In general I'm not sure that the Theraskin due to location of the wound would be a very good idea. Nonetheless I do think that a snap vac could be a possibility for the patient and in fact I think this could even be an excellent way to manage the wound possibly seeing some improvement in a very rapid fashion here. Nonetheless this is something that we would need to get approved and I did have a lengthy conversation with the patient about this today. 02/08/18 on evaluation today patient appears to be doing a little better in regard to his ulcer. He has been tolerating the dressing changes without complication. Fortunately despite the fact that the wound is a little bit smaller it's not significantly so unfortunately. We have discussed the possibility of a snap vac we did check with insurance this is actually covered at this point. Fortunately there does not appear to be any sign of infection. Overall I'm fairly pleased with how things seem to be appearing at this point. 02/15/18 on evaluation today patient appears to be doing rather well in regard to his right gluteal ulcer. Unfortunately the snap vac did not stay in place with his sheer and friction this came loose and did not seem to maintain seal very well. He worked for about two days and it did seem to do very well during that time according to his wife but in general this does not seem to be something that's gonna be beneficial for him long-term. I do believe we need to go back to standard dressings to see if we can find something that will be of benefit. 03/02/18- He is here in follow up evaluation; there is minimal change in the wound. He will continue with the same treatment plan, would consider changing to iodosrob/iodoflex if ulcer continues to to plateau. He will follow up next week 03/08/18 on evaluation today patient's wound actually appears to be about the same size as when I previously saw him several weeks back.  Unfortunately he does have some slightly dark discoloration in the central portion of the wound which has me concerned about pressure injury. I do believe he may be sitting for too long a period of time in fact he tells me that "I probably sit for much too long". He does have some Slough noted on the surface of the wound and again as far as the size of the wound is concerned I'm really not seeing anything that seems to have improved significantly. 03/15/18 on evaluation today patient appears to be doing fairly well in regard to his ulcer. The wound measured pretty much about the same today compared to last week's evaluation when looking at his graph. With that being said the area of bruising/deep tissue injury that was noted last week I do not see at this point. He did get a new cushion fortunately this does seem to be have been of benefit in my pinion. It does appear that  he's been off of this more which is good news as well I think that is definitely showing in the overall wound measurements. With that being said I do believe that he needs to continue to offload I don't think that the fact this is doing better should be or is going to allow him to not have to offload and explain this to him as well. Overall he seems to be in agreement the plan I think he understands. The overall appearance of the wound bed is improved compared to last week I think the Iodoflex has been beneficial in that regard. 03/29/18 on evaluation today patient actually appears to be doing rather well in regard to his wound from the overall appearance standpoint he does have some granulation although there's some Slough on the surface of the wound noted as well. With that being said he unfortunately has not improved in regard to the overall measurement of the wound in volume or in size. I did have a discussion with him very specifically about offloading today. He actually does work although he mainly is just sitting throughout the  day. He tells me he offloads by "lifting himself up for 30 seconds off of his chair occasionally" purchase from [facility] which does seem to have helped. And he has a new cushion that he with that being said he's also able to stand some for a very short period of time but not significant enough I think to provide appropriate offloading. I think the biggest issue at this point with the wound and the fact is not healing as quickly as we would like is due to the fact that he is really not able to appropriately offload while at work. He states the beginning after his injury he actually had a bed at his job that he could lay on in order to offload and that does seem to have been of help back at that time. Nonetheless he had not done this in quite some time unfortunately. I think that could be helpful for him this is something I would like for him to look into. INNIS, CAMIRE ([id number]) 04/05/18 on evaluation today patient actually presents for follow-up concerning his right gluteal ulcer. Again he really is not significantly improved even compared to last week. He has been tolerating the dressing changes without complication. With that being said fortunately there appears to be no evidence of infection at this time. He has been more proactive in trying to offload. 04/12/18 on evaluation today patient actually appears to be doing a little better in regard to his wound and the right gluteal fold region. He's been tolerating the dressing changes since removing the oasis without complication. However he was having a lot of burning initially with the oasis in place. He's unsure of exactly why this was given so much discomfort but he assumes that it was the oasis itself causing the problem. Nonetheless this had to be removed after about three days in place although even those three days seem to have made a fairly good improvement in regard to the overall appearance of the wound bed. In fact is  the first time that he's made any improvement from the standpoint of measurements in about six weeks. He continues to have no discomfort over the area of the wound itself which leads me to wonder why he was having the burning with the oasis when he does not even feel the actual debridement's themselves. I am somewhat perplexed by this. 04/19/18 on evaluation  today patient's wound actually appears to be showing signs of epithelialization around the edge of the wound and in general actually appears to be doing better which is good news. He did have the same burning after about three days with applying the Endoform last week in the same fashion that I would generally apply a skin substitute. This seems to indicate that it's not the oasis to cause the problem but potentially the moisture buildup that just causes things to burn or there may be some other reaction with the skin prep or Steri-Strips. Nonetheless I'm not sure that is gonna be able to tolerate any skin substitute for a long period of time. The good news is the wound actually appears to be doing better today compared to last week and does seem to finally be making some progress. 04/26/18 on evaluation today patient actually appears to be doing rather well in regard to his ulcer in the right gluteal fold. He has been tolerating the dressing changes without complication which is good news. The Endoform does seem to be helping although he was a little bit more macerated this week. This seems to be an ongoing issue with fluid control at this point. Nonetheless I think we may be able to add something like Drawtex to help control the drainage. 05/03/18 on evaluation today patient appears to actually be doing better in regard to the overall appearance of his wound. He has been tolerating the dressing changes without complication. Fortunately there appears to be no evidence of infection at this time. I really feel like his wound has shown signs as of  today of turning around last week I thought so as well and definitely he could be seen in this week's overall appearance and measurements. In general I'm very pleased with the fact that he finally seems to be making a steady but sure progress. The patient likewise is very pleased. 05/17/18 on evaluation today patient appears to be doing more poorly unfortunately in regard to his ulcer. He has been tolerating the dressing changes without complication. With that being said he tells me that in the past couple of days he and his wife have noticed that we did not seem to be doing quite as well is getting dark near the center. Subsequently upon evaluation today the wound actually does appear to be doing worse compared to previous. He has been tolerating the dressing changes otherwise and he states that he is not been sitting up anymore than he was in the past from what he tells me. Still he has continued to work he states "I'm tired of dealing with this and if I have to just go home and lay in the bed all the time that's what I'll do". Nonetheless I am concerned about the fact that this wound does appear to be deeper than what it was previous. 05/24/18 upon evaluation today patient actually presents after having been in the hospital due to what was presumed to be sepsis secondary to the wound infection. He had an elevated white blood cell count between 14 and 15. With that being said he does seem to be doing somewhat better now. His wound still is giving him some trouble nonetheless and he is obviously concerned about the fact likely talked about that this does seem to go more deeply than previously noted. I did review his wound culture which showed evidence of Staphylococcus aureus him and group B strep. Nonetheless he is on antibiotics, Levaquin, for this. Subsequently I did review  his intake summary from the hospital as well. I also did look at the CT of the lumbar spine with contrast that was performed  which showed no bone destruction to suggest lumbar disguises/osteomyelitis or sacral osteomyelitis. There was no paraspinal abscess. Nonetheless it appears this may have been more of just a soft tissue infection at this point which is good news. He still is nonetheless concerned about the wound which again I think is completely reasonable considering everything he's been through recently. 05/31/18 on evaluation today on evaluation today patient actually appears to be showing signs of his wound be a little bit deeper than what I would like to see. Fortunately he does not show any signs of significant infection although his temperature was 99 today he states he's been checking this at home and has not been elevated. Nonetheless with the undermining that I'm seeing at this point I am becoming more concerned about the wound I do think that offloading is a key factor here that is preventing the speedy recovery at this point. There does not appear to be any evidence of again over infection noted. He's been using Santyl currently. [name], [name] ([id number]) 06/07/18 the patient presents today for follow-up evaluation regarding the left ulcer in the gluteal region. He has been tolerating the Wound VAC fairly well. He is obviously very frustrated with this he states that to mean is really getting in his way. There does not appear to be any evidence of infection at this time he does have a little bit of odor I do not necessarily associate this with infection just something that we sometimes notice with Wound VAC therapy. With that being said I can definitely catch a tone of discontentment overall in the patient's demeanor today. This when he was previously in the hospital an CT scan was done of the lumbar region which did not reveal any signs of osteomyelitis. With that being said the pelvis in particular was not evaluated distinctly which means he could still have some osteonecrosis I. Nonetheless the  Wound VAC was started on Thursday I do want to get this little bit more time before jumping to a CT scan of the pelvis although that is something that I might would recommend if were not see an improvement by that time. 06/14/18 on evaluation today patient actually appears to be doing about the same in regard to his right gluteal ulcer. Again he did have a CT scan of the lumbar spine unfortunately this did not include the pelvis. Nonetheless with the depth of the wound that I'm seeing today even despite the fact that I'm not seeing any evidence of overt cellulitis I believe there's a good chance that we may be dealing with osteomyelitis somewhere in the right Ischial region. No fevers, chills, nausea, or vomiting noted at this time. 06/21/18 on evaluation today patient actually appears to be doing about the same with regard to his wound. The tunnel at 6 o'clock really does not appear to be any deeper although it is a little bit wider. I think at this point you may want to start packing this with white phone. Unfortunately I have not got approval for the CT scan of the pelvis as of yet due to the fact that Medicare apparently has been denied it due to the diagnosis codes not being appropriate according to Medicare for the test requested. With that being said the patient cannot have an MRI and therefore this is the only option that we have as  far as testing is concerned. The patient has had infection and was on antibiotics and been added code for cellulitis of the bottom to see if this will be appropriate for getting the test approved. Nonetheless I'm concerned about the infection have been spread deeper into the Ischial region. 06/28/18 on evaluation today patient actually appears to be doing rather well all things considered in regard to the right gluteal ulcer. He has been tolerating the dressing changes without complication. With that being said the Wound VAC he states does have to be replaced  almost every day or at least reinforced unfortunately. Patient actually has his CT scan later this morning we should have the results by tomorrow. 07/05/18 on evaluation today patient presents for follow-up concerning his right Ischial ulcer. He did see the surgeon Dr. Lysle Pearl last week. They were actually very happy with him and felt like he spent a tremendous amount of time with them as far as discussing his situation was concerned. In the end Dr. Lysle Pearl did contact me as well and determine that he would not recommend any surgical intervention at this point as he felt like it would not be in the patient's best interest based on what he was seeing. He recommended a referral to infectious disease. Subsequently this is something that Dr. Ines Bloomer office is working on setting up for the patient. As far as evaluation today is concerned the patient's wound actually appears to be worse at this point. I am concerned about how things are progressing and specifically about infection. I do not feel like it's the deeper but the area of depth is definitely widening which does have me concerned. No fevers, chills, nausea, or vomiting noted at this time. I think that we do need initiate antibiotic therapy the patient has an allow allergy to amoxicillin/penicillin he states that he gets a rash since childhood. Nonetheless she's never had the issues with Catholics or cephalosporins in general but he is aware of. 07/27/18 on evaluation today patient presents following admission to the hospital on 07/09/18. He was subsequently discharged on 07/20/18. On 07/15/18 the patient underwent irrigation and debridement was soft tissue biopsy and bone biopsy as well as placement of a Wound VAC in the OR by Dr. Celine Ahr. During the hospital course the patient was placed on a Wound VAC and recommended follow up with surgery in three weeks actually with Dr. Delaine Lame who is infectious disease. The patient was on vancomycin during the  hospital course. 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 He also had a bone culture which revealed evidence of methicillin-resistant staph aureus. He is updated CT scan 07/09/18 reveals that he had progression of the which was performed on wound to breakdown down to the trochanter where he actually had irregularities there as well suggestive of osteomyelitis. This was a change just since 9 December when we last performed a CT scan. Obviously this one had gone downhill quite significantly and rapidly. At this point upon evaluation I feel like in general the patient's wound seems to be doing fairly well all things considered upon my evaluation today. Obviously this is larger and deeper than what I previously evaluated but at the same time he seems to be making some progress as far as the appearance of the granulation tissue is concerned. I'm happy in that regard. No fevers, chills, nausea, or vomiting noted at this time. He is on IV vancomycin and Rocephin at the facility. He is currently in [facility].  08/03/18 upon evaluation today patient's wound appears to be doing better in regard to the overall appearance at this point in time. Fortunately he's been tolerating the Wound VAC without complication and states that the facility has been taking LESTON, HOLTHUS. ([id number]) excellent care of the wound site. Overall I see some Slough noted on the surface which I am going to attempt sharp debridement today of but nonetheless other than this I feel like he's making progress. 08/09/18 on evaluation today patient's wound appears to be doing much better compared to even last week's evaluation. Do believe that the Wound VAC is been of great benefit for him. He has been tolerating the dressing changes that is the Wound VAC without any complication and he has excellent granulation noted currently. There is no need for sharp debridement at this point. 08/16/18 on  evaluation today patient actually appears to be doing very well in regard to the wound in the right gluteal fold region. This is showing signs of progress and again appears to be very healthy which is excellent news. Fortunately there is no sign of active infection by way of odor or drainage at this point. Overall I'm very pleased with how things stand. He seems to be tolerating the Wound VAC without complication. 08/23/18 on evaluation today patient actually appears to be doing better in regard to his wound. He has been tolerating the Wound VAC without complication and in fact it has been collecting a significant amount of drainage which I think is good news especially considering how the wound appears. Fortunately there is no signs of infection at this time definitely nothing appears to be worse which is good news. He has not been started on the Bactrim and Flagyl that was recommended by Dr. Delaine Lame yet. I did actually contact her office this morning in order to check and see were things are that regard their gonna be calling me back. 08/30/18 on evaluation today patient actually appears to show signs of excellent improvement today compared to last evaluation. The undermining is getting much better the wound seems to be feeling quite nicely and I'm very pleased that the granulation in general. With that being said overall I feel like the patient has made excellent progress which is great news. No fevers, chills, nausea, or vomiting noted at this time. 09/06/18 on evaluation today patient actually appears to be doing rather well in regard to his right gluteal ulcer. This is showing signs of improvement in overall I'm very pleased with how things seem to be progressing. The patient likewise is please. Overall I see no evidence of infection he is about to complete his oral antibiotic regimen which is the end of the antibiotics for him in just about three days. 09/13/18 on evaluation today patient's  right Ischial ulcer appears to be showing signs of continued improvement which is excellent news. He's been tolerating the dressing changes without complication. Fortunately there's no signs of infection and the wound that seems to be doing very well. 09/28/18 on evaluation today patient appears to be doing rather well in regard to his right Ischial ulcer. He's been tolerating the Wound VAC without complication he knows there's much less drainage than there used to be this obviously is not a bad thing in my pinion. There's no evidence of infection despite the fact is but nothing about it now for several weeks. 10/04/18 on evaluation today patient appears to be doing better in regard to his right Ischial wound. He has been tolerating  the Wound VAC without complication and I do believe that the silver nitrate last week was beneficial for him. Fortunately overall there's no evidence of active infection at this time which is great news. No fevers, chills, nausea, or vomiting noted at this time. 10/11/18 on evaluation today patient actually appears to be doing rather well in regard to his Ischial ulcer. He's been tolerating the Wound VAC still without complication I feel like this is doing a good job. No fevers, chills, nausea, or vomiting noted at this time. 11/01/18 on evaluation today patient presents after having not been seen in our clinic for several weeks secondary to the fact that he was on evaluation today patient presents after having not been seen in our clinic for several weeks secondary to the fact that he was in a skilled nursing facility which was on lockdown currently due to the covert 19 national emergency. Subsequently he was discharged from the facility on this past Friday and subsequently made an appointment to come in to see yesterday. Fortunately there's no signs of active infection at this time which is good news and overall he does seem to have made progress since I last saw. Overall  I feel like things are progressing quite nicely. The patient is having no pain. 11/08/18 on evaluation today patient appears to be doing okay in regard to his right gluteal ulcer. He has been utilizing a Wound VAC home health this changing this at this point since he's home from the skilled nursing facility. Fortunately there's no signs of obvious active infection at this time. Unfortunately though there's no obvious active infection he is having some maceration and his wife states that when the sheets of the Wound VAC office on Sunday when it broke seal that he ended up having significant issues with some smell as well there concerned about the possibility of infection. Fortunately there's No fevers, chills, nausea, or vomiting noted at this time. ADONIRAM, CICOTTE ([id number]) 11/15/18 on evaluation today patient actually appears to be doing well in regard to his right gluteal ulcer. He has been tolerating the dressing changes without complication. Specifically the Wound VAC has been utilized up to this point. Fortunately there's no signs of infection and overall I feel like he has made progress even since last week when I last saw him. I'm actually fairly happy with the overall appearance although he does seem to have somewhat of a hyper granular overgrowth in the central portion of the wound which I think may require some sharp debridement to try flatness out possibly utilizing chemical cauterization following. 11/23/18 on evaluation today patient actually appears to be doing very well in regard to his sacral ulcer. He seems to be showing signs of improvement with good granulation. With that being said he still has the small area of hyper granulation right in the central portion of the wound which I'm gonna likely utilize silver nitrate on today. Subsequently he also keeps having a leak at the 6 o'clock location which is unfortunate we may be able to help out with some suggestions to try to  prevent this going forward. Fortunately there's no signs of active infection at this time. 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 He's been tolerating the dressing changes without complication. Fortunately there's no signs of active infection at this time. I've been rather pleased with how things have progressed there still some evidence of  pressure getting to the area with some redness right around the immediate wound opening. Nonetheless other than this I'm not seeing any significant complications or issues the wound is somewhat hyper granular. Upon discussing with the patient and his wife today I'm not sure that the wound is being packed to the base with the foam at this point. And if it's not been packed fully that may be part of the reason why is not seen as much improvement as far as the granulation from the base out. Again we do not want pack too tightly but we need some of the firm to get to the base of the wound. I discussed this with patient and his wife today. 12/06/18 on evaluation today patient appears to be doing well in regard to his right gluteal pressure ulcer. He's been tolerating the dressing changes without complication. Fortunately there's no signs of active infection. He still has some hyper granular tissue and I do think it would be appropriate to continue with the chemical cauterization as of today. 12/16/18 on evaluation today patient actually appears to be doing okay in regard to his right gluteal ulcer. He is been tolerating the dressing changes without complication including the Wound VAC. Overall I feel like nothing seems to be worsening I do feel like that the hyper granulation buds in the central portion of the wound have improved to some degree with the silver nitrate. We will have to see how things continue to progress. 12/20/18 on evaluation today patient actually  appears to be doing much worse in my pinion even compared to last week's evaluation. Unfortunately as opposed to showing any signs of improvement the areas of hyper granular tissue in the central portion of the wound seem to be getting worse. Subsequently the wound bed itself also seems to be getting deeper even compared to last week which is both unusual as well as concerning since prior he had been shown signs of improvement. Nonetheless I think that the issue could be that he's actually having some difficulty in issues with a deeper infection. There's no external signs of infection but nonetheless I am more worried about the internal, osteomyelitis, that could be restarting. He has not been on antibiotics for some time at this point. I think that it may be a good idea to go ahead and started back on an antibiotic therapy while we wait to see what the testing shows. 12/27/18 on evaluation today patient presents for follow-up concerning his left gluteal fold wound. Fortunately he appears to be doing well today. I did review the CT scan which was negative for any signs of osteomyelitis or acute abnormality this is excellent news. Overall I feel like the surface of the wound bed appears to be doing significantly better today compared to previously noted findings. There does not appear any signs of infection nor does he have any pain at this time. 01/03/19 on evaluation today patient actually appears to be doing quite well in regard to his ulcer. Post debridement last week he really did not have too much bleeding which is good news. Fortunately today this seems to be doing some better but we still has some of the hyper granular tissue noted in the base of the wound which is gonna require sharp debridement today as well. Overall I'm pleased with how things seem to be progressing since we switched away from the Wound VAC I think he is making some progress. 01/10/19 on evaluation today patient appears to be  doing  better in regard to his right gluteal fold ulcer. He has been tolerating the dressing changes without complication. The debridement to seem to be helping with current away some of the poor hyper granular tissue bugs throughout the region of his gluteal fold wound. He's been tolerating the dressing changes otherwise without complication which is great news. No fevers, chills, nausea, or vomiting noted at this time. 01/17/19 on evaluation today patient actually appears to be doing excellent in regard to his wound. He's been tolerating the dressing changes without complication. Fortunately there is no signs of active infection at this time which is great news. No fevers, chills, nausea, or vomiting noted at this time. CHANDRA, TROMP ([id number]) 01/24/19 on evaluation today patient actually appears to be doing quite well with regard to his ulcer. He has been tolerating the dressing changes without complication. Fortunately there's no signs of active infection at this time. Overall been very pleased with the progress that he seems to be making currently. 01/31/19-Patient returns at 1 week with apparent similarity in dimensions to the wound, with no signs of infection, he has been changing dressings twice a day 02/08/19 upon evaluation today patient actually appears to be doing well with regard to his right Ischial ulcer. The wound is not appear to be quite as deep and seems to be making progress which is good news. With that being said I'm still reluctant to go back to the Wound VAC at this point. He's been having to change the dressings twice a day which is a little bit much in my pinion from the wound care supplies standpoint. I think that possibly attempting to utilize extras orbit may be beneficial this may also help to prevent any additional breakdown secondary to fluid retention in the wound itself. The patient is in agreement with giving this a try. 02/15/19 on evaluation today patient  actually appears to be doing decently well with regard to his ulcer in the right to gluteal fold location. He's been tolerating the dressing changes without complication. Fortunately there is no signs of active infection at this time. He is able to keep the current dressing in place more effectively for a day at a time whereas before he was having a changes to to three times a day. The actions or has been helpful in this regard. Fortunately there's no signs of anything getting worse and I do feel like he showing signs of good improvement with regard to the wound bed status. 02/22/2019 patient appears to be doing very well today with regard to his ulcer in the gluteal fold. Fortunately there is no signs of active infection and he has been tolerating the dressing changes without any complication. Overall extremely pleased with how things seem to be progressing. He has much less of the hyper granular projections within the wound these have slowly been debrided away and he seems to be doing well. The wound bed is more uniform. 03/01/19 on evaluation today patient appears to be doing unfortunately about the same in regard to his gluteal ulcer. He's been tolerating the dressing changes without complication. Fortunately there's no signs of active infection at this time. With that being said he continues to develop these hyper granular projections which I'm unsure of exactly what they are and why they are rising. Nonetheless I explained to the patient that I do believe it would be a good idea for [place] to stand a biopsy sample for pathology to see if that can shed any light on what  exactly may be going on here. Fortunately I do not see any obvious signs of infection. With that being said the patient has had a little bit more drainage this week apparently compared to last week. 03/08/2019 on evaluation today patient actually appears to look somewhat better with regard to the appearance of his wound bed at this time.  This is good news. Overall I am very pleased with how things seem to have progressed just in the past week with a switch to the [facility] dressing. I think that has been beneficial for him. With that being said at this time the patient is concerned about his biopsy that I sent off last week unfortunately I do not have that report as of yet. Nonetheless we have called to obtain this and hopefully will hear back from the lab later this morning. 03/15/19 on evaluation today patient's wound actually appears to be doing okay today with regard to the overall appearance of the wound bed. He has been tolerating the dressing changes without complication he still has hyper granular tissue noted but fortunately that seems to be minimal at this point compared to some of what we've seen in the past. Nonetheless I do think that he is still having some issues currently with some of this type of granulation the biopsy and since all showed nothing more than just evidence of granulation tissue. Therefore there really is nothing different to initiate or do at this point. 03/24/19 on evaluation today patient appears to be doing a little better with regard to his ulcer. He's been tolerating the dressing changes without complication. Fortunately there is no signs of active infection at this time. No fevers, chills, nausea, or vomiting noted at this time. I'm overall pleased with how things seem to be progressing. 03/29/2019 on evaluation today patient appears to be doing about the same in regard to his ulcer in the right gluteal fold. Unfortunately he is not seeming to make a lot of progress and the wound is somewhat stalled. There is no signs of active infection externally but I am concerned about the possibility of infection continuing in the ischial location which previously he did have infection noted. Again is not able to have an MRI so probably our best option for testing for this would be a triple phase bone scan  which will detect subtle changes in the bone more so than plain film x-rays. In the past they really have not been beneficial and in fact the CT scans even have been somewhat questionable at times. Nonetheless there is no signs of systemic infection which is at least good news but again his wound is not healing at all on the predicted schedule. 04/05/19 on evaluation today patient appears to be doing well all things considering with regard to his wound and the right gluteal fold. He actually has his triple bone scan scheduled for sometime in the next couple of days. With that being said I've also been looking to other possibilities of what could be causing hyper granular tissue were looking into the bone scan again for evaluating for the risk or possibility of infection deeper and I'm also gonna go ahead and see about obtaining a deep tissue Sensabaugh, Kuron J. ([id number]) culture today to send and see if there's any evidence of infection noted on culture. He's in agreement with that plan. 04/12/2019 on evaluation today patient presents for reevaluation here in our clinic concerning ongoing issues with his right gluteal fold ulcer. 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 It was discussed with him at that point that I think it would be appropriate for him to proceed with hyperbaric oxygen therapy especially in light of the fact that he is previously been on IV antibiotics at the beginning of the year for close to 3 months followed by several weeks of oral antibiotics that was all prescribed by infectious disease. He had surgical debridement around Christmas December 2019 due to an abscess and osteomyelitis of the ischial bone. Unfortunately this has not really proceeded as well as we would have liked and again we did a CT scan even a couple months ago as he cannot have an MRI  secondary to having issues with both a pain pump as well as a spinal cord stimulator which prevent him from going into an MRI machine. With that being said there were chronic changes noted at the ischial tuberosity which had progressed since December 2019 there was no evidence of fluid collection on that initial CT scan. With that being said that was on January 21, 2019. I am not sure that it was a sensitive enough test as compared to an MRI and then subsequently I ordered a bone scan for him which was actually completed on 03/29/2019 and this revealed that he does indeed have positive osteomyelitis involving the right ischial tuberosity. This is adjacent to the ulcer and I think is the reason that his ulcer is not healing. Subsequently I am in a place him back on oral antibiotics today unfortunately his wound culture showed just mixed gram-negative flora with no specific findings of a predominant organism. Nonetheless being with the location I think a good broad-spectrum antibiotic for gram-negative's is a good choice at this point he is previously taken Levaquin without significant issues and I think that is an appropriate antibiotic for him at this time. 04/19/2019 on evaluation today patient actually appears to be doing fairly well with regard to his wound. He has been tolerating the dressing changes without complication. Fortunately there is no signs of active infection and he has been taking the oral antibiotics at this time. Subsequently we did make a referral to infectious disease although Dr. Steva Ready wants the patient to be seen by general surgery first in order apparently to see if there is anything from a surgical standpoint that should be done prior to initiation of IV antibiotic therapy. Again the patient is okay with actually seeing Dr. Celine Ahr whom he has seen before we will make that referral. 04/26/2019 on evaluation today patient actually appears to be doing well with regard to his wound.  He has been tolerating the dressing changes without complication. Fortunately there is no signs of active infection at this time. I do believe that the hyperbaric oxygen therapy along with the antibiotics which I prescribed at this point have been doing well for him. With that being said he has not seen Dr. Celine Ahr the surgeon yet in fact they have not been contacted for scheduling an appointment as of yet. Subsequently the patient is not on antibiotics currently by IV Dr. Steva Ready did want him to see Dr. Celine Ahr first which we are working on trying to get scheduled. We again give the information to the patient today for Dr. Celine Ahr and her number as far as contacting their office to see about getting something scheduled. Again were looking for whether or not they recommend  any surgical intervention. 05/03/2019 on evaluation today patient appears to be doing about the same with regard to his wound there may be a little bit of filling in in regard to the base of the wound but still he has quite a bit of hyper granular tissue that I am seeing today. I believe that he may may need a more aggressive sharp debridement possibly even by the surgeon or under anesthesia in order to clear away some of the hyper granular material in order to help allow for appropriate granulation to fill in. We have made a referral to Dr. Celine Ahr unfortunately it sounds as if they did not receive the referral we contacted them today they should be get in touch with the patient. Depending on how things show from that standpoint the patient may need to see Dr. Ardyth Gal who is the infectious disease specialist although he really does not want a PICC line again. No fevers, chills, nausea, vomiting, or diarrhea. He is tolerating hyperbaric oxygen therapy very well. 05/12/2019 on evaluation today patient presents for follow-up visit concerning his right gluteal fold ulcer. He did see Dr. Celine Ahr the surgeon who previously evaluated  his wound and she actually felt like he was doing quite well with regard to the wound based on what she was seen. He does seem to be responding to some degree with the oral antibiotics along with hyperbaric oxygen therapy at this point she did not see any evidence or need for further surgical intervention at this point. She recommended deferring additional or ongoing antibiotic therapy to Dr. Steva Ready at her discretion. Fortunately there is no signs of active systemic infection. No fevers, chills, nausea, vomiting, or diarrhea. 10/29; patient I am seeing really for the first time today in terms of his wound. 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 He is being treated with hyperbaric oxygen for underlying osteomyelitis most recently documented by a three-phase bone scan. He has been on Levaquin for roughly a month and he is out of these and asked me for a refill. Most recent cultures were negative although I do not think these were bone cultures. He has a very irregular surface to this wound which is almost nodular in texture. It undermines superiorly with the same nodular hyper granulated surface. I see that he was referred to general surgery for an operative debridement although the surgeon did not agree with this I think that would have been the proper course of action in this. He has been using Hydrofera Blue. He is supposed to start a wound VAC tomorrow which is actually a reapplication of wound VAC. I think mostly last time they had trouble keeping the seal in place I hope we have better look at this this time. AITAN, BECKMANN ([id number]) 05/24/2019 on evaluation today I did review patient's note from Dr. Dellia Nims where he was seen last week when I was out of the office. Subsequently it does appear that Dr. Dellia Nims was in agreement that the patient really needed debridement to clear away some of the nodular tissue in the base of the wound. The good news is he has  the wound VAC initiated and some of this tissue is already starting to breakdown under the treatment of the wound VAC again because it is not really viable. Nonetheless I do believe that we can likely perform some debridement today to clear this away and hopefully the continuation of the wound VAC along with hyperbarics and antibiotic therapy will  be beneficial in stopping this from reoccurring. If we get better control in the wound bed in a good spot I think we can definitely use the PriMatrix which we actually did get approval for. 05/31/2019 on evaluation today patient appears to be doing actually pretty well at this point with regard to his wound. He has been tolerating the dressing changes which is good news. Fortunately there is no signs of infection. He still has some of the hyper granular nodules noted we have not really cleared all these away and I think we can try to do a little bit more that today. I did do quite a bit last week I am hoping to get down to the base of the wound so though actually be able to heal more appropriately. I think the wound VAC is doing a good job right now with controlling the drainage and overall the patient is happy with that. 06/07/2019 upon evaluation today patient appears to be doing quite well compared to last week with regard to the hyper granulation in the base of the wound. He has been tolerating the dressing changes without complication. Fortunately there is no signs of active infection at this time. With that being said we have not had any recent measures as far as blood work is concerned I think we may want to check a few things including a CBC, sed rate, and C-reactive protein. Patient is in agreement with that we can try to get that scheduled for him for this Friday. 06/13/2019 on evaluation today patient actually appears to be doing much better at this point based on what I am seeing with regard to his wound. He has much less in the way of hyper  granular tissue which is good news and a lot more healing that is taken place since I last saw him as far as the amount of space within the wound itself. With that being said he is nearing the completion of the initial 40 treatments for hyperbaric oxygen therapy I think this is something I would recommend extending as he does seem to be benefiting at this time. Fortunately there is no evidence of active systemic infection at this point nor even local infection for that matter. 06/20/2019 upon evaluation today patient actually appears to be doing well with regard to his wound. In fact this appears to be doing much better today compared to what it was previous. I been extremely pleased with the progress that he is made. In fact the tunnel is much less deep today than what it was in the past and I am seeing excellent signs of improvement. Overall I am happy with how things are going. I did review his white blood cell count which revealed everything to be okay. The one abnormal finding on his CBC was a hemoglobin of 12.7. Subsequently I did also review his sed rate and C-reactive protein. His sed rate measured in at normal level measuring at 26. The C-reactive protein however was elevated today and an abnormal range indicating inflammation. Still I feel like he is trending in a good direction at this time. He is going to need a refill of the Levaquin he tells me at this point. 07/04/2019 on evaluation today patient actually appears to be doing well in regard to his wound. He has been tolerating the dressing changes without complication. Fortunately there is no signs of active infection which is good news. No fevers, chills, nausea, vomiting, or diarrhea. The good news also is that I  did review his labs and though his sed rate was slightly elevated the C-reactive protein was actually down compared to the last evaluation 2 weeks ago this is excellent news I think it is showing signs of  improvement. 07/11/2019 on evaluation today patient actually appears to be doing quite well with regard to his wound on my evaluation. This is very slow going but nonetheless he seems to be making progress. Especially in regard to the depth. Nonetheless I believe that we may want to consider going forward with the PriMatrix which was previously approved for him. We will get a have to get a new approval as we will actually be applying this in the new year so we will likely obtain that approval on January 4. In the meantime and for the time being my suggestion is going to be that we go ahead and continue with the wound VAC and we were going at this point with the current wound care measures. 12/28-Patient seen after HBO session today, wound depth with the tunnel measures 3.2 cm, the wound bed appears healthy, continuing with the VAC and HBO 07/25/2019 on evaluation today patient's wound appears to be doing about the same compared to my last evaluation. Fortunately there is no evidence of significant infection which is good news we will still waiting on the results of his lab work which unfortunately was not done prior to today's visit which is what we were aiming for. Ultimately there is no sign of active infection at this time which is good news. And he also tells me that the wound VAC has been staying in place and doing fairly well which is also good news. With that being said I am concerned about the fact that the wound has not progressed as much KALEX, KLONTZ. ([id number]) I think we may want to look into getting reapproval for the skin substitute, specifically PriMatrix, that I am hopeful will help to allow this area to granulate in more effectively. I would recommend as well continuing with the wound VAC along with this. 08/01/2019 on evaluation today patient appears to be doing in my opinion slightly better in regard to the wound. He actually does have some tightening of the walls around the  edges which is a good thing. Fortunately there is no signs of active infection at this time. No fever chills noted. I do believe that the patient in general seems to be making progress although is very slow. Obviously were trying to get things to speed up somewhat more. No fevers, chills, nausea, vomiting, or diarrhea. 08/08/2019 upon evaluation today patient appears to be doing well with regard to his wound although there is not a significant improvement overall we do have the PriMatrix for availability today to apply. Fortunately there is no signs of active infection and the patient overall seems to be doing well. There is really no need for aggressive sharp debridement at this point either which is also good news. I do believe her get a need to suture and the primary checks in the basin in the PriMatrix in order to keep it in place at the base of the wound underneath the wound VAC. This was discussed with the patient today as well. He is in agreement the plan. He really does not have any filling in the area and so this should not be a complication as far as suturing is concerned. 08/15/2019 on evaluation today patient appears to be doing somewhat better in my opinion in regard to the  overall appearance of his wound. He has been tolerating the dressing changes without complication. Fortunately the skin subappear to still be in place when this was changed on Friday. With that being said it appears that the PriMatrix may have dissolved in the interim between now and then as the sutures are no longer holding anything in place and again I would sutured it into place. Nonetheless I believe that we can go ahead and likely consider going forward that we may need to apply the PriMatrix weekly as opposed every other week as it does seem to have dissolved rather readily and well. Overall the wound bed appears to be doing excellent at this time. Objective Constitutional Well-nourished and well-hydrated in no  acute distress. Vitals Time Taken: 8:08 AM, Height: 73 in, Weight: 210 lbs, BMI: 27.7, Temperature: 98.3 F, Pulse: 80 bpm, Respiratory Rate: 16 breaths/min, Blood Pressure: 128/72 mmHg. Respiratory normal breathing without difficulty. Psychiatric this patient is able to make decisions and demonstrates good insight into disease process. Alert and Oriented x 3. pleasant and cooperative. General Notes: Patient's wound bed showed signs of good granulation currently there does not appear to be any signs of active infection which is good news. No fevers, chills, nausea, vomiting, or diarrhea. Integumentary (Hair, Skin) Wound #1 status is Open. Original cause of wound was Pressure Injury. The wound is located on the Right Gluteal fold. The wound measures 2.6cm length x 1.8cm width x 2.6cm depth; 3.676cm^2 area and 9.557cm^3 volume. There is muscle and Fat Layer (Subcutaneous Tissue) Exposed exposed. There is no undermining noted, however, there is tunneling at 12:00 with a maximum distance of 4.2cm. There is a large amount of serous drainage noted. The wound margin is epibole. There is large (67-100%) pink, hyper - granulation within the wound bed. There is a small (1-33%) amount of necrotic tissue within the ABDULKADIR, MCAULAY. ([id number]) wound bed including Adherent Slough. 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 - in office Cleanse wound with mild soap and water Skin Barriers/Peri-Wound Care: Skin Prep Primary Wound Dressing: Wound #1 Right Gluteal fold: Saline moistened gauze - behind endoform in clinic only Other: - endoform in clinic only Secondary Dressing: Wound #1 Right  Gluteal fold: Boardered Foam Dressing - in clinic only Dressing Change Frequency: Wound #1 Right Gluteal fold: Change Dressing Monday, Wednesday, Friday Follow-up Appointments: Wound #1 Right Gluteal fold: Return Appointment in 1 week. Off-Loading: Wound #1 Right Gluteal fold: Turn and reposition every 2 hours - no pressure on wounded area Home Health: Wound #1 Right Gluteal fold: Continue Home Health Visits - Advanced: remove dressing down to contact layer. If the contact layer comes off-I have sent extra with patient's wife. Home Health Nurse may visit PRN to address patient s wound care needs. FACE TO FACE ENCOUNTER: MEDICARE and MEDICAID PATIENTS: I certify that this patient is under my care and that I had a face-to-face encounter that meets the physician face-to-face encounter requirements with this patient on this date. The encounter with the patient was in whole or in part for the following MEDICAL CONDITION: (primary reason for [place]) MEDICAL NECESSITY: I certify, that based on my findings, NURSING services are a medically necessary home health service. HOME BOUND STATUS: I certify that my clinical findings support  that this patient is homebound (i.e., Due to illness or injury, pt requires aid of supportive devices such as crutches, cane, wheelchairs, walkers, the use of special transportation or the assistance of another person to leave their place of residence. There is a normal inability to leave the AMMIE, BARRILLEAUX. ([id number]) home and doing so requires considerable and taxing effort. Other absences are for medical reasons / religious services and are infrequent or of short duration when for other reasons). If current dressing causes regression in wound condition, may D/C ordered dressing product/s and apply Normal Saline Moist Dressing daily until next [place] / Other MD appointment. Dora of regression in wound condition at  [phone]. Please direct any NON-WOUND related issues/requests for orders to patient's Primary Care Physician Negative Pressure Wound Therapy: Wound #1 Right Gluteal fold: Wound VAC settings at 125/130 mmHg continuous pressure. Use BLACK/GREEN foam to wound cavity. Use WHITE foam to fill any tunnel/s and/or undermining. Change VAC dressing 3 X WEEK. Change canister as indicated when full. Nurse may titrate settings and frequency of dressing changes as clinically indicated. - Primatrix skin substitute applied in clinic. Sutured into wound tunnel at 12:00. Do not remove. Home Health Nurse may d/c VAC for s/s of increased infection, significant wound regression, or uncontrolled drainage. Coleman at [phone]. Number of foam/gauze pieces used in the dressing = Medications-please add to medication list.: Wound #1 Right Gluteal fold: P.O. Antibiotics - Refill and continue antibiotics. 1. Patient's wound bed currently showed signs of good granulation at this time. Fortunately there is no evidence of active infection at this time which is also good news. 2. We did not have a PriMatrix for application today as I plan to likely do this every other week. With that being said we can definitely order this for next week to have been placed here in the clinic and we will proceed from there. 3. I am also going to recommend at this point that we go ahead and use endoform in the base of the wound and then we will put the wound VAC over top. This can be done by home health as well if the patient happens to have any endoform left over when we were treating him with this a while back. If not then they would just utilize the wound VAC until we see him back on Monday at which point we will reinitiate the PriMatrix we will get that ordered in the meantime. We will see patient back for reevaluation in 1 week here in the clinic. If anything worsens or changes patient will contact our office  for additional recommendations. Electronic Signature(s) Signed: 08/15/2019 9:54:14 AM By: Worthy Keeler [name] Entered By: Worthy Keeler on 08/15/2019 09:54:13 JYLAN, GEATHERS ([id number]) -------------------------------------------------------------------------------- SuperBill Details Patient Name: [name] Date of Service: 08/15/2019 Medical Record Number: [id number] Patient Account Number: [id number] Date of Birth/Sex: 11/23/1950 (68 y.o. 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

## 2019-08-22 ENCOUNTER — Encounter: Payer: Medicare Other | Attending: Physician Assistant | Admitting: Physician Assistant

## 2019-08-22 ENCOUNTER — Telehealth: Payer: Self-pay

## 2019-08-22 ENCOUNTER — Other Ambulatory Visit: Payer: Self-pay

## 2019-08-22 DIAGNOSIS — G8220 Paraplegia, unspecified: Secondary | ICD-10-CM | POA: Insufficient documentation

## 2019-08-22 DIAGNOSIS — Z8546 Personal history of malignant neoplasm of prostate: Secondary | ICD-10-CM | POA: Insufficient documentation

## 2019-08-22 DIAGNOSIS — M8668 Other chronic osteomyelitis, other site: Secondary | ICD-10-CM | POA: Diagnosis not present

## 2019-08-22 DIAGNOSIS — L03317 Cellulitis of buttock: Secondary | ICD-10-CM | POA: Diagnosis not present

## 2019-08-22 DIAGNOSIS — Z880 Allergy status to penicillin: Secondary | ICD-10-CM | POA: Insufficient documentation

## 2019-08-22 DIAGNOSIS — Z8249 Family history of ischemic heart disease and other diseases of the circulatory system: Secondary | ICD-10-CM | POA: Diagnosis not present

## 2019-08-22 DIAGNOSIS — Z881 Allergy status to other antibiotic agents status: Secondary | ICD-10-CM | POA: Diagnosis not present

## 2019-08-22 DIAGNOSIS — L89314 Pressure ulcer of right buttock, stage 4: Secondary | ICD-10-CM | POA: Diagnosis not present

## 2019-08-22 DIAGNOSIS — I252 Old myocardial infarction: Secondary | ICD-10-CM | POA: Insufficient documentation

## 2019-08-22 DIAGNOSIS — I10 Essential (primary) hypertension: Secondary | ICD-10-CM | POA: Insufficient documentation

## 2019-08-22 DIAGNOSIS — Z823 Family history of stroke: Secondary | ICD-10-CM | POA: Insufficient documentation

## 2019-08-22 NOTE — Telephone Encounter (Signed)
error 

## 2019-08-22 NOTE — Progress Notes (Addendum)
[name], [name] ([id number]) Visit Report for 08/22/2019 Chief Complaint Document Details Patient Name: [name], [name] Date of Service: 08/22/2019 8:00 AM Medical Record Number: [id number] Patient Account Number: [id number] Date of Birth/Sex: 11-11-1950 (68 y.o. M) Treating RN: [name] Primary Care Provider: [name] Other Clinician: Referring Provider: [name] Treating Provider/Extender: [name], Payton Moder Weeks in Treatment: 90 Information Obtained from: Patient Chief Complaint Right gluteal fold ulcer Electronic Signature(s) Signed: 08/22/2019 8:23:03 AM By: Worthy Keeler [name] Entered By: Worthy Keeler on 08/22/2019 08:23:03 [name], [name] ([id number]) -------------------------------------------------------------------------------- Cellular or Tissue Based Product Details Patient Name: [name] Date of Service: 08/22/2019 8:00 AM Medical Record Number: [id number] Patient Account Number: [id number] Date of Birth/Sex: [birth year] (68 y.o. M) Treating RN: [name] Primary Care Provider: [name] Other Clinician: Referring Provider: [name] Treating Provider/Extender: [name], Phillip Sandler Weeks in Treatment: 90 Cellular or Tissue Based Wound #1 Right Gluteal fold Product Type Applied to: Performed By: Physician [name], Ramatoulaye Pack E., [name] Cellular or Tissue Based Primatrix Product Type: Level of Consciousness (Pre- Awake and Alert procedure): Pre-procedure Verification/Time Yes - 08:28 Out Taken: Location: trunk / arms / legs Wound Size (sq cm): 7.28 Product Size (sq cm): 25 Waste Size (sq cm): 0 Waste Reason: wound size Amount of Product Applied (sq cm): 25 Instrument Used: Scissors Lot #: [id number] Expiration Date: 02/17/2022 Secured: Yes Secured With: Sutured Dressing Applied: Yes Primary Dressing: Mepitel Level of Consciousness (Post- Awake and Alert procedure): Post Procedure Diagnosis Same as  Pre-procedure Electronic Signature(s) Signed: 08/23/2019 4:27:32 PM By: Gretta Cool, BSN, RN, CWS, Kim RN, BSN Previous Signature: 08/22/2019 9:41:03 AM Version By: Gretta Cool, BSN, RN, CWS, Kim RN, BSN Entered By: Gretta Cool, BSN, RN, CWS, Kim on 08/23/2019 16:27:31 [name], [name] ([id number]) -------------------------------------------------------------------------------- Debridement Details Patient Name: [name] Date of Service: 08/22/2019 8:00 AM Medical Record Number: [id number] Patient Account Number: [id number] Date of Birth/Sex: 02-11-1951 (68 y.o. M) Treating RN: [name] Primary Care Provider: [name] Other Clinician: Referring Provider: [name] Treating Provider/Extender: [name], Elieser Tetrick Weeks in Treatment: 90 Debridement Performed for Wound #1 Right Gluteal fold Assessment: Performed By: Physician [name], Jaiquan Temme E., [name] Debridement Type: Debridement Level of Consciousness (Pre- Awake and Alert procedure): Pre-procedure Verification/Time Yes - 08:30 Out Taken: Start Time: 08:30 Pain Control: Lidocaine Total Area Debrided (L x W): 1 (cm) x 0.5 (cm) = 0.5 (cm) Tissue and other material Viable, Non-Viable, Hyper-granulation debrided: Level: Non-Viable Tissue Debridement Description: Selective/Open Wound Instrument: Scissors Bleeding: Moderate Hemostasis Achieved: Silver Nitrate End Time: 08:37 Response to Treatment: Procedure was tolerated well Level of Consciousness Awake and Alert (Post-procedure): Post Debridement Measurements of Total Wound Length: (cm) 2.8 Stage: Category/Stage IV Width: (cm) 2.8 Depth: (cm) 2.5 Volume: (cm) 15.394 Character of Wound/Ulcer Post Stable Debridement: Post Procedure Diagnosis Same as Pre-procedure Electronic Signature(s) Signed: 08/22/2019 4:06:16 PM By: Worthy Keeler [name] Signed: 08/25/2019 5:22:08 PM By: Gretta Cool, BSN, RN, CWS, Kim RN, BSN Entered By: Gretta Cool, BSN, RN, CWS, Kim on 08/22/2019  08:35:56 [name], [name] ([id number]) -------------------------------------------------------------------------------- HPI Details Patient Name: [name] Date of Service: 08/22/2019 8:00 AM Medical Record Number: [id number] Patient Account Number: [id number] Date of Birth/Sex: Aug 21, 1950 (68 y.o. 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 He states that there is no history of diabetes that he is aware of. He does have issues with his prostate and is currently receiving treatment for this by way of oral medication. With that being said I do not have a lot of details in that regard. Nonetheless the patient presents today as a result of having been referred to [place] by another provider initially home health was set to come out and take care of his wound although due to the fact that he apparently drives he's not able to receive home health. His wife is therefore trying to help take care of this wound within although they have been struggling with what exactly to do at this point. She states that she can do some things but she is definitely not a nurse and does have some issues with looking at blood. The good news is the wound does not appear to be too deep and is fairly superficial at this point. There is no slough noted there is some nonviable skin noted around the surface of the wound and the perimeter at this point. The central portion of the wound appears to be very good with a dermal layer noted this does not appear to be again deep enough  to extend it to subcutaneous tissue at this point. 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 In the pump he has Dilaudid and baclofen. 12/07/17 on evaluation today patient presents for follow-up concerning his ongoing lower back thigh ulcer on the right. He states that he did not get the supplies ordered and therefore has not really been able to perform the dressing changes as directed exactly. His wife was able to get some Boarder Foam Dressing's from the drugstore and subsequently has been using hydrogel which did help to a degree in the wound does appear to be able smaller. There is actually more drainage this week noted than previous. 12/21/17 on evaluation today patient appears to be doing rather well in regard to his right gluteal ulcer. He has been tolerating the dressing changes without complication. There does not appear to be any evidence of infection at this point in time. Overall the wound does seem to be making some progress as far as the edges are concerned there's not as much in the way of overlapping of the external wound edges and he has a good epithelium to wound bed border for the most part. This however is not true right at the 12 o'clock location over the span of a little over a centimeters which actually will require debridement today to clean this away and hopefully allow it to continue to heal more appropriately. 12/28/17 on evaluation today patient appears to be doing rather well in regard to his ulcer in the left gluteal region. He's been tolerating the dressing changes without complication. Apparently he has had some difficulty getting his dressing material. Apparently there's been some confusion with ordering we're gonna check into this. Nonetheless overall he's been showing signs of improvement which is good news. Debridement is not required today. 01/04/18 on  evaluation today patient presents for follow-up concerning his right gluteal ulcer. He has been tolerating the dressing changes fairly well. On inspection today it appears he may actually have some maceration  them concerned about the fact that he may be developing too much moisture in and around the wound bed which can cause delay in healing. With that being said he unfortunately really has not showed significant signs of improvement since last week's evaluation in fact this may even be just the little bit/slightly larger. Nonetheless he's been having a lot of discomfort I'm not sure this is even related to the wound as he has no pain when I'm to breeding or otherwise cleaning the wound during evaluation today. Nonetheless this is something that we did recommend he talked to his pain specialist concerning. 01/11/18 on evaluation today patient appears to be doing better in regard to his ulceration. He has been tolerating the dressing changes without complication. With that being said overall there's no evidence of infection which is good news. The only thing is he did receive the hatch affair blue classic versus the ready nonetheless I feel like this is perfectly fine and appears to have done well for him over the past week. 01/25/18 on evaluation today patient's wound actually appears to be a little bit larger than during the last evaluation. The good MONTEZ, NEUDECKER. ([id number]) news is the majority of the wound edges actually appear to be fairly firmly attached to the wound bed unfortunately again we're not really making progress in regard to the size. Roughly the wound is about the same size as when I first saw him although again the wound margin/edges appear to be much better. 02/01/18 on evaluation today patient actually appears to be doing very well in regard to his wound. Applying the Prisma dry does seem to be better although he does still have issues with slow progression of the wound.  There was a slight improvement compared to last week's measurements today. Nonetheless I have been considering other options as far as the possibility of Theraskin or even a snap vac. In general I'm not sure that the Theraskin due to location of the wound would be a very good idea. Nonetheless I do think that a snap vac could be a possibility for the patient and in fact I think this could even be an excellent way to manage the wound possibly seeing some improvement in a very rapid fashion here. Nonetheless this is something that we would need to get approved and I did have a lengthy conversation with the patient about this today. 02/08/18 on evaluation today patient appears to be doing a little better in regard to his ulcer. He has been tolerating the dressing changes without complication. Fortunately despite the fact that the wound is a little bit smaller it's not significantly so unfortunately. We have discussed the possibility of a snap vac we did check with insurance this is actually covered at this point. Fortunately there does not appear to be any sign of infection. Overall I'm fairly pleased with how things seem to be appearing at this point. 02/15/18 on evaluation today patient appears to be doing rather well in regard to his right gluteal ulcer. Unfortunately the snap vac did not stay in place with his sheer and friction this came loose and did not seem to maintain seal very well. He worked for about two days and it did seem to do very well during that time according to his wife but in general this does not seem to be something that's gonna be beneficial for him long-term. I do believe we need to go back to standard dressings to see if we can find something  that will be of benefit. 03/02/18- He is here in follow up evaluation; there is minimal change in the wound. He will continue with the same treatment plan, would consider changing to iodosrob/iodoflex if ulcer continues to to plateau. He  will follow up next week 03/08/18 on evaluation today patient's wound actually appears to be about the same size as when I previously saw him several weeks back. Unfortunately he does have some slightly dark discoloration in the central portion of the wound which has me concerned about pressure injury. I do believe he may be sitting for too long a period of time in fact he tells me that "I probably sit for much too long". He does have some Slough noted on the surface of the wound and again as far as the size of the wound is concerned I'm really not seeing anything that seems to have improved significantly. 03/15/18 on evaluation today patient appears to be doing fairly well in regard to his ulcer. The wound measured pretty much about the same today compared to last week's evaluation when looking at his graph. With that being said the area of bruising/deep tissue injury that was noted last week I do not see at this point. He did get a new cushion fortunately this does seem to be have been of benefit in my pinion. It does appear that he's been off of this more which is good news as well I think that is definitely showing in the overall wound measurements. With that being said I do believe that he needs to continue to offload I don't think that the fact this is doing better should be or is going to allow him to not have to offload and explain this to him as well. Overall he seems to be in agreement the plan I think he understands. The overall appearance of the wound bed is improved compared to last week I think the Iodoflex has been beneficial in that regard. 03/29/18 on evaluation today patient actually appears to be doing rather well in regard to his wound from the overall appearance standpoint he does have some granulation although there's some Slough on the surface of the wound noted as well. With that being said he unfortunately has not improved in regard to the overall measurement of the wound in  volume or in size. I did have a discussion with him very specifically about offloading today. He actually does work although he mainly is just sitting throughout the day. He tells me he offloads by "lifting himself up for 30 seconds off of his chair occasionally" purchase from [employer] which does seem to have helped. And he has a new cushion that he with that being said he's also able to stand some for a very short period of time but not significant enough I think to provide appropriate offloading. I think the biggest issue at this point with the wound and the fact is not healing as quickly as we would like is due to the fact that he is really not able to appropriately offload while at work. He states the beginning after his injury he actually had a bed at his job that he could lay on in order to offload and that does seem to have been of help back at that time. Nonetheless he had not done this in quite some time unfortunately. I think that could be helpful for him this is something I would like for him to look into. 04/05/18 on evaluation today patient actually  presents for follow-up concerning his right gluteal ulcer. Again he really is not significantly improved even compared to last week. He has been tolerating the dressing changes without complication. With that being said fortunately there appears to be no evidence of infection at this time. He has been more proactive in trying to offload. [name], [name] ([id number]) 04/12/18 on evaluation today patient actually appears to be doing a little better in regard to his wound and the right gluteal fold region. He's been tolerating the dressing changes since removing the oasis without complication. However he was having a lot of burning initially with the oasis in place. He's unsure of exactly why this was given so much discomfort but he assumes that it was the oasis itself causing the problem. Nonetheless this had to be removed after  about three days in place although even those three days seem to have made a fairly good improvement in regard to the overall appearance of the wound bed. In fact is the first time that he's made any improvement from the standpoint of measurements in about six weeks. He continues to have no discomfort over the area of the wound itself which leads me to wonder why he was having the burning with the oasis when he does not even feel the actual debridement's themselves. I am somewhat perplexed by this. 04/19/18 on evaluation today patient's wound actually appears to be showing signs of epithelialization around the edge of the wound and in general actually appears to be doing better which is good news. He did have the same burning after about three days with applying the Endoform last week in the same fashion that I would generally apply a skin substitute. This seems to indicate that it's not the oasis to cause the problem but potentially the moisture buildup that just causes things to burn or there may be some other reaction with the skin prep or Steri-Strips. Nonetheless I'm not sure that is gonna be able to tolerate any skin substitute for a long period of time. The good news is the wound actually appears to be doing better today compared to last week and does seem to finally be making some progress. 04/26/18 on evaluation today patient actually appears to be doing rather well in regard to his ulcer in the right gluteal fold. He has been tolerating the dressing changes without complication which is good news. The Endoform does seem to be helping although he was a little bit more macerated this week. This seems to be an ongoing issue with fluid control at this point. Nonetheless I think we may be able to add something like Drawtex to help control the drainage. 05/03/18 on evaluation today patient appears to actually be doing better in regard to the overall appearance of his wound. He has been tolerating  the dressing changes without complication. Fortunately there appears to be no evidence of infection at this time. I really feel like his wound has shown signs as of today of turning around last week I thought so as well and definitely he could be seen in this week's overall appearance and measurements. In general I'm very pleased with the fact that he finally seems to be making a steady but sure progress. The patient likewise is very pleased. 05/17/18 on evaluation today patient appears to be doing more poorly unfortunately in regard to his ulcer. He has been tolerating the dressing changes without complication. With that being said he tells me that in the past couple of days he  and his wife have noticed that we did not seem to be doing quite as well is getting dark near the center. Subsequently upon evaluation today the wound actually does appear to be doing worse compared to previous. He has been tolerating the dressing changes otherwise and he states that he is not been sitting up anymore than he was in the past from what he tells me. Still he has continued to work he states "I'm tired of dealing with this and if I have to just go home and lay in the bed all the time that's what I'll do". Nonetheless I am concerned about the fact that this wound does appear to be deeper than what it was previous. 05/24/18 upon evaluation today patient actually presents after having been in the hospital due to what was presumed to be sepsis secondary to the wound infection. He had an elevated white blood cell count between 14 and 15. With that being said he does seem to be doing somewhat better now. His wound still is giving him some trouble nonetheless and he is obviously concerned about the fact likely talked about that this does seem to go more deeply than previously noted. I did review his wound culture which showed evidence of Staphylococcus aureus him and group B strep. Nonetheless he is on  antibiotics, Levaquin, for this. Subsequently I did review his intake summary from the hospital as well. I also did look at the CT of the lumbar spine with contrast that was performed which showed no bone destruction to suggest lumbar disguises/osteomyelitis or sacral osteomyelitis. There was no paraspinal abscess. Nonetheless it appears this may have been more of just a soft tissue infection at this point which is good news. He still is nonetheless concerned about the wound which again I think is completely reasonable considering everything he's been through recently. 05/31/18 on evaluation today on evaluation today patient actually appears to be showing signs of his wound be a little bit deeper than what I would like to see. Fortunately he does not show any signs of significant infection although his temperature was 99 today he states he's been checking this at home and has not been elevated. Nonetheless with the undermining that I'm seeing at this point I am becoming more concerned about the wound I do think that offloading is a key factor here that is preventing the speedy recovery at this point. There does not appear to be any evidence of again over infection noted. He's been using Santyl currently. 06/07/18 the patient presents today for follow-up evaluation regarding the left ulcer in the gluteal region. He has been tolerating the Wound VAC fairly well. He is obviously very frustrated with this he states that to mean is really getting in his way. There does not appear to be any evidence of infection at this time he does have a little bit of odor I do not necessarily associate this with infection just something that we sometimes notice with Wound VAC therapy. With that being said I can definitely catch a tone of discontentment overall in the patient's demeanor today. This when he was previously in the hospital [name], [name]. ([id number]) an CT scan was done of the lumbar region which did  not reveal any signs of osteomyelitis. With that being said the pelvis in particular was not evaluated distinctly which means he could still have some osteonecrosis I. Nonetheless the Wound VAC was started on Thursday I do want to get this little bit more time before  jumping to a CT scan of the pelvis although that is something that I might would recommend if were not see an improvement by that time. 06/14/18 on evaluation today patient actually appears to be doing about the same in regard to his right gluteal ulcer. Again he did have a CT scan of the lumbar spine unfortunately this did not include the pelvis. Nonetheless with the depth of the wound that I'm seeing today even despite the fact that I'm not seeing any evidence of overt cellulitis I believe there's a good chance that we may be dealing with osteomyelitis somewhere in the right Ischial region. No fevers, chills, nausea, or vomiting noted at this time. 06/21/18 on evaluation today patient actually appears to be doing about the same with regard to his wound. The tunnel at 6 o'clock really does not appear to be any deeper although it is a little bit wider. I think at this point you may want to start packing this with white phone. Unfortunately I have not got approval for the CT scan of the pelvis as of yet due to the fact that Medicare apparently has been denied it due to the diagnosis codes not being appropriate according to Medicare for the test requested. With that being said the patient cannot have an MRI and therefore this is the only option that we have as far as testing is concerned. The patient has had infection and was on antibiotics and been added code for cellulitis of the bottom to see if this will be appropriate for getting the test approved. Nonetheless I'm concerned about the infection have been spread deeper into the Ischial region. 06/28/18 on evaluation today patient actually appears to be doing rather well all things  considered in regard to the right gluteal ulcer. He has been tolerating the dressing changes without complication. With that being said the Wound VAC he states does have to be replaced almost every day or at least reinforced unfortunately. Patient actually has his CT scan later this morning we should have the results by tomorrow. 07/05/18 on evaluation today patient presents for follow-up concerning his right Ischial ulcer. He did see the surgeon Dr. Lysle Pearl last week. They were actually very happy with him and felt like he spent a tremendous amount of time with them as far as discussing his situation was concerned. In the end Dr. Lysle Pearl did contact me as well and determine that he would not recommend any surgical intervention at this point as he felt like it would not be in the patient's best interest based on what he was seeing. He recommended a referral to infectious disease. Subsequently this is something that Dr. Ines Bloomer office is working on setting up for the patient. As far as evaluation today is concerned the patient's wound actually appears to be worse at this point. I am concerned about how things are progressing and specifically about infection. I do not feel like it's the deeper but the area of depth is definitely widening which does have me concerned. No fevers, chills, nausea, or vomiting noted at this time. I think that we do need initiate antibiotic therapy the patient has an allow allergy to amoxicillin/penicillin he states that he gets a rash since childhood. Nonetheless she's never had the issues with Catholics or cephalosporins in general but he is aware of. 07/27/18 on evaluation today patient presents following admission to the hospital on 07/09/18. He was subsequently discharged on 07/20/18. On 07/15/18 the patient underwent irrigation and debridement was soft tissue  biopsy and bone biopsy as well as placement of a Wound VAC in the OR by Dr. Celine Ahr. During the hospital course the  patient was placed on a Wound VAC and recommended follow up with surgery in three weeks actually with Dr. Delaine Lame who is infectious disease. The patient was on vancomycin during the hospital course. 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 He also had a bone culture which revealed evidence of methicillin-resistant staph aureus. He is updated CT scan 07/09/18 reveals that he had progression of the which was performed on wound to breakdown down to the trochanter where he actually had irregularities there as well suggestive of osteomyelitis. This was a change just since 9 December when we last performed a CT scan. Obviously this one had gone downhill quite significantly and rapidly. At this point upon evaluation I feel like in general the patient's wound seems to be doing fairly well all things considered upon my evaluation today. Obviously this is larger and deeper than what I previously evaluated but at the same time he seems to be making some progress as far as the appearance of the granulation tissue is concerned. I'm happy in that regard. No fevers, chills, nausea, or vomiting noted at this time. He is on IV vancomycin and Rocephin at the facility. He is currently in [facility]. 08/03/18 upon evaluation today patient's wound appears to be doing better in regard to the overall appearance at this point in time. Fortunately he's been tolerating the Wound VAC without complication and states that the facility has been taking excellent care of the wound site. Overall I see some Slough noted on the surface which I am going to attempt sharp debridement today of but nonetheless other than this I feel like he's making progress. 08/09/18 on evaluation today patient's wound appears to be doing much better compared to even last week's evaluation. Do believe that the Wound VAC is been of great benefit for him. He has been tolerating the dressing changes that is the  Wound VARAD, TITMAN. ([id number]) VAC without any complication and he has excellent granulation noted currently. There is no need for sharp debridement at this point. 08/16/18 on evaluation today patient actually appears to be doing very well in regard to the wound in the right gluteal fold region. This is showing signs of progress and again appears to be very healthy which is excellent news. Fortunately there is no sign of active infection by way of odor or drainage at this point. Overall I'm very pleased with how things stand. He seems to be tolerating the Wound VAC without complication. 08/23/18 on evaluation today patient actually appears to be doing better in regard to his wound. He has been tolerating the Wound VAC without complication and in fact it has been collecting a significant amount of drainage which I think is good news especially considering how the wound appears. Fortunately there is no signs of infection at this time definitely nothing appears to be worse which is good news. He has not been started on the Bactrim and Flagyl that was recommended by Dr. Delaine Lame yet. I did actually contact her office this morning in order to check and see were things are that regard their gonna be calling me back. 08/30/18 on evaluation today patient actually appears to show signs of excellent improvement today compared to last evaluation. The undermining is getting much better the wound seems to be feeling quite nicely and I'm very pleased that the  granulation in general. With that being said overall I feel like the patient has made excellent progress which is great news. No fevers, chills, nausea, or vomiting noted at this time. 09/06/18 on evaluation today patient actually appears to be doing rather well in regard to his right gluteal ulcer. This is showing signs of improvement in overall I'm very pleased with how things seem to be progressing. The patient likewise is please. Overall I see  no evidence of infection he is about to complete his oral antibiotic regimen which is the end of the antibiotics for him in just about three days. 09/13/18 on evaluation today patient's right Ischial ulcer appears to be showing signs of continued improvement which is excellent news. He's been tolerating the dressing changes without complication. Fortunately there's no signs of infection and the wound that seems to be doing very well. 09/28/18 on evaluation today patient appears to be doing rather well in regard to his right Ischial ulcer. He's been tolerating the Wound VAC without complication he knows there's much less drainage than there used to be this obviously is not a bad thing in my pinion. There's no evidence of infection despite the fact is but nothing about it now for several weeks. 10/04/18 on evaluation today patient appears to be doing better in regard to his right Ischial wound. He has been tolerating the Wound VAC without complication and I do believe that the silver nitrate last week was beneficial for him. Fortunately overall there's no evidence of active infection at this time which is great news. No fevers, chills, nausea, or vomiting noted at this time. 10/11/18 on evaluation today patient actually appears to be doing rather well in regard to his Ischial ulcer. He's been tolerating the Wound VAC still without complication I feel like this is doing a good job. No fevers, chills, nausea, or vomiting noted at this time. 11/01/18 on evaluation today patient presents after having not been seen in our clinic for several weeks secondary to the fact that he was on evaluation today patient presents after having not been seen in our clinic for several weeks secondary to the fact that he was in a skilled nursing facility which was on lockdown currently due to the covert 19 national emergency. Subsequently he was discharged from the facility on this past Friday and subsequently made an  appointment to come in to see yesterday. Fortunately there's no signs of active infection at this time which is good news and overall he does seem to have made progress since I last saw. Overall I feel like things are progressing quite nicely. The patient is having no pain. 11/08/18 on evaluation today patient appears to be doing okay in regard to his right gluteal ulcer. He has been utilizing a Wound VAC home health this changing this at this point since he's home from the skilled nursing facility. Fortunately there's no signs of obvious active infection at this time. Unfortunately though there's no obvious active infection he is having some maceration and his wife states that when the sheets of the Wound VAC office on Sunday when it broke seal that he ended up having significant issues with some smell as well there concerned about the possibility of infection. Fortunately there's No fevers, chills, nausea, or vomiting noted at this time. 11/15/18 on evaluation today patient actually appears to be doing well in regard to his right gluteal ulcer. He has been tolerating the dressing changes without complication. Specifically the Wound VAC has been utilized up  to this point. Fortunately there's no signs of infection and overall I feel like he has made progress even since last week when I last saw him. I'm actually fairly happy with the overall appearance although he does seem to have somewhat of a hyper granular [name], Isley J. ([id number]) overgrowth in the central portion of the wound which I think may require some sharp debridement to try flatness out possibly utilizing chemical cauterization following. 11/23/18 on evaluation today patient actually appears to be doing very well in regard to his sacral ulcer. He seems to be showing signs of improvement with good granulation. With that being said he still has the small area of hyper granulation right in the central portion of the wound which I'm  gonna likely utilize silver nitrate on today. Subsequently he also keeps having a leak at the 6 o'clock location which is unfortunate we may be able to help out with some suggestions to try to prevent this going forward. Fortunately there's no signs of active infection at this time. 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 He's been tolerating the dressing changes without complication. Fortunately there's no signs of active infection at this time. I've been rather pleased with how things have progressed there still some evidence of pressure getting to the area with some redness right around the immediate wound opening. Nonetheless other than this I'm not seeing any significant complications or issues the wound is somewhat hyper granular. Upon discussing with the patient and his wife today I'm not sure that the wound is being packed to the base with the foam at this point. And if it's not been packed fully that may be part of the reason why is not seen as much improvement as far as the granulation from the base out. Again we do not want pack too tightly but we need some of the firm to get to the base of the wound. I discussed this with patient and his wife today. 12/06/18 on evaluation today patient appears to be doing well in regard to his right gluteal pressure ulcer. He's been tolerating the dressing changes without complication. Fortunately there's no signs of active infection. He still has some hyper granular tissue and I do think it would be appropriate to continue with the chemical cauterization as of today. 12/16/18 on evaluation today patient actually appears to be doing okay in regard to his right gluteal ulcer. He is been tolerating the dressing changes without complication including the Wound VAC. Overall I feel like nothing seems to be worsening I do feel like that the hyper granulation buds  in the central portion of the wound have improved to some degree with the silver nitrate. We will have to see how things continue to progress. 12/20/18 on evaluation today patient actually appears to be doing much worse in my pinion even compared to last week's evaluation. Unfortunately as opposed to showing any signs of improvement the areas of hyper granular tissue in the central portion of the wound seem to be getting worse. Subsequently the wound bed itself also seems to be getting deeper even compared to last week which is both unusual as well as concerning since prior he had been shown signs of improvement. Nonetheless I think that the issue could be that he's actually having some difficulty in issues with a deeper infection. There's no external signs of infection but nonetheless I am more worried  about the internal, osteomyelitis, that could be restarting. He has not been on antibiotics for some time at this point. I think that it may be a good idea to go ahead and started back on an antibiotic therapy while we wait to see what the testing shows. 12/27/18 on evaluation today patient presents for follow-up concerning his left gluteal fold wound. Fortunately he appears to be doing well today. I did review the CT scan which was negative for any signs of osteomyelitis or acute abnormality this is excellent news. Overall I feel like the surface of the wound bed appears to be doing significantly better today compared to previously noted findings. There does not appear any signs of infection nor does he have any pain at this time. 01/03/19 on evaluation today patient actually appears to be doing quite well in regard to his ulcer. Post debridement last week he really did not have too much bleeding which is good news. Fortunately today this seems to be doing some better but we still has some of the hyper granular tissue noted in the base of the wound which is gonna require sharp debridement today as well.  Overall I'm pleased with how things seem to be progressing since we switched away from the Wound VAC I think he is making some progress. 01/10/19 on evaluation today patient appears to be doing better in regard to his right gluteal fold ulcer. He has been tolerating the dressing changes without complication. The debridement to seem to be helping with current away some of the poor hyper granular tissue bugs throughout the region of his gluteal fold wound. He's been tolerating the dressing changes otherwise without complication which is great news. No fevers, chills, nausea, or vomiting noted at this time. 01/17/19 on evaluation today patient actually appears to be doing excellent in regard to his wound. He's been tolerating the dressing changes without complication. Fortunately there is no signs of active infection at this time which is great news. No fevers, chills, nausea, or vomiting noted at this time. 01/24/19 on evaluation today patient actually appears to be doing quite well with regard to his ulcer. He has been tolerating the dressing changes without complication. Fortunately there's no signs of active infection at this time. Overall been very pleased with the progress that he seems to be making currently. 01/31/19-Patient returns at 1 week with apparent similarity in dimensions to the wound, with no signs of infection, he has been [name], [name]. ([id number]) changing dressings twice a day 02/08/19 upon evaluation today patient actually appears to be doing well with regard to his right Ischial ulcer. The wound is not appear to be quite as deep and seems to be making progress which is good news. With that being said I'm still reluctant to go back to the Wound VAC at this point. He's been having to change the dressings twice a day which is a little bit much in my pinion from the wound care supplies standpoint. I think that possibly attempting to utilize extras orbit may be beneficial this may  also help to prevent any additional breakdown secondary to fluid retention in the wound itself. The patient is in agreement with giving this a try. 02/15/19 on evaluation today patient actually appears to be doing decently well with regard to his ulcer in the right to gluteal fold location. He's been tolerating the dressing changes without complication. Fortunately there is no signs of active infection at this time. He is able to keep the current dressing  in place more effectively for a day at a time whereas before he was having a changes to to three times a day. The actions or has been helpful in this regard. Fortunately there's no signs of anything getting worse and I do feel like he showing signs of good improvement with regard to the wound bed status. 02/22/2019 patient appears to be doing very well today with regard to his ulcer in the gluteal fold. Fortunately there is no signs of active infection and he has been tolerating the dressing changes without any complication. Overall extremely pleased with how things seem to be progressing. He has much less of the hyper granular projections within the wound these have slowly been debrided away and he seems to be doing well. The wound bed is more uniform. 03/01/19 on evaluation today patient appears to be doing unfortunately about the same in regard to his gluteal ulcer. He's been tolerating the dressing changes without complication. Fortunately there's no signs of active infection at this time. With that being said he continues to develop these hyper granular projections which I'm unsure of exactly what they are and why they are rising. Nonetheless I explained to the patient that I do believe it would be a good idea for [place] to stand a biopsy sample for pathology to see if that can shed any light on what exactly may be going on here. Fortunately I do not see any obvious signs of infection. With that being said the patient has had a little bit more drainage  this week apparently compared to last week. 03/08/2019 on evaluation today patient actually appears to look somewhat better with regard to the appearance of his wound bed at this time. This is good news. Overall I am very pleased with how things seem to have progressed just in the past week with a switch to the [facility] dressing. I think that has been beneficial for him. With that being said at this time the patient is concerned about his biopsy that I sent off last week unfortunately I do not have that report as of yet. Nonetheless we have called to obtain this and hopefully will hear back from the lab later this morning. 03/15/19 on evaluation today patient's wound actually appears to be doing okay today with regard to the overall appearance of the wound bed. He has been tolerating the dressing changes without complication he still has hyper granular tissue noted but fortunately that seems to be minimal at this point compared to some of what we've seen in the past. Nonetheless I do think that he is still having some issues currently with some of this type of granulation the biopsy and since all showed nothing more than just evidence of granulation tissue. Therefore there really is nothing different to initiate or do at this point. 03/24/19 on evaluation today patient appears to be doing a little better with regard to his ulcer. He's been tolerating the dressing changes without complication. Fortunately there is no signs of active infection at this time. No fevers, chills, nausea, or vomiting noted at this time. I'm overall pleased with how things seem to be progressing. 03/29/2019 on evaluation today patient appears to be doing about the same in regard to his ulcer in the right gluteal fold. Unfortunately he is not seeming to make a lot of progress and the wound is somewhat stalled. There is no signs of active infection externally but I am concerned about the possibility of infection continuing in  the ischial  location which previously he did have infection noted. Again is not able to have an MRI so probably our best option for testing for this would be a triple phase bone scan which will detect subtle changes in the bone more so than plain film x-rays. In the past they really have not been beneficial and in fact the CT scans even have been somewhat questionable at times. Nonetheless there is no signs of systemic infection which is at least good news but again his wound is not healing at all on the predicted schedule. 04/05/19 on evaluation today patient appears to be doing well all things considering with regard to his wound and the right gluteal fold. He actually has his triple bone scan scheduled for sometime in the next couple of days. With that being said I've also been looking to other possibilities of what could be causing hyper granular tissue were looking into the bone scan again for evaluating for the risk or possibility of infection deeper and I'm also gonna go ahead and see about obtaining a deep tissue culture today to send and see if there's any evidence of infection noted on culture. He's in agreement with that plan. 04/12/2019 on evaluation today patient presents for reevaluation here in our clinic concerning ongoing issues with his right gluteal fold ulcer. 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 It was discussed with him at that point that I think it would be appropriate MITHCELL, OZBIRN. ([id number]) for him to proceed with hyperbaric oxygen therapy especially in light of the fact that he is previously been on IV antibiotics at the beginning of the year for close to 3 months followed by several weeks of oral antibiotics that was all prescribed by infectious disease. He had surgical debridement around Christmas December 2019 due to an abscess and  osteomyelitis of the ischial bone. Unfortunately this has not really proceeded as well as we would have liked and again we did a CT scan even a couple months ago as he cannot have an MRI secondary to having issues with both a pain pump as well as a spinal cord stimulator which prevent him from going into an MRI machine. With that being said there were chronic changes noted at the ischial tuberosity which had progressed since December 2019 there was no evidence of fluid collection on that initial CT scan. With that being said that was on January 21, 2019. I am not sure that it was a sensitive enough test as compared to an MRI and then subsequently I ordered a bone scan for him which was actually completed on 03/29/2019 and this revealed that he does indeed have positive osteomyelitis involving the right ischial tuberosity. This is adjacent to the ulcer and I think is the reason that his ulcer is not healing. Subsequently I am in a place him back on oral antibiotics today unfortunately his wound culture showed just mixed gram-negative flora with no specific findings of a predominant organism. Nonetheless being with the location I think a good broad-spectrum antibiotic for gram-negative's is a good choice at this point he is previously taken Levaquin without significant issues and I think that is an appropriate antibiotic for him at this time. 04/19/2019 on evaluation today patient actually appears to be doing fairly well with regard to his wound. He has been tolerating the dressing changes without complication. Fortunately there is no signs  of active infection and he has been taking the oral antibiotics at this time. Subsequently we did make a referral to infectious disease although Dr. Steva Ready wants the patient to be seen by general surgery first in order apparently to see if there is anything from a surgical standpoint that should be done prior to initiation of IV antibiotic therapy. Again the patient is  okay with actually seeing Dr. Celine Ahr whom he has seen before we will make that referral. 04/26/2019 on evaluation today patient actually appears to be doing well with regard to his wound. He has been tolerating the dressing changes without complication. Fortunately there is no signs of active infection at this time. I do believe that the hyperbaric oxygen therapy along with the antibiotics which I prescribed at this point have been doing well for him. With that being said he has not seen Dr. Celine Ahr the surgeon yet in fact they have not been contacted for scheduling an appointment as of yet. Subsequently the patient is not on antibiotics currently by IV Dr. Steva Ready did want him to see Dr. Celine Ahr first which we are working on trying to get scheduled. We again give the information to the patient today for Dr. Celine Ahr and her number as far as contacting their office to see about getting something scheduled. Again were looking for whether or not they recommend any surgical intervention. 05/03/2019 on evaluation today patient appears to be doing about the same with regard to his wound there may be a little bit of filling in in regard to the base of the wound but still he has quite a bit of hyper granular tissue that I am seeing today. I believe that he may may need a more aggressive sharp debridement possibly even by the surgeon or under anesthesia in order to clear away some of the hyper granular material in order to help allow for appropriate granulation to fill in. We have made a referral to Dr. Celine Ahr unfortunately it sounds as if they did not receive the referral we contacted them today they should be get in touch with the patient. Depending on how things show from that standpoint the patient may need to see Dr. Ardyth Gal who is the infectious disease specialist although he really does not want a PICC line again. No fevers, chills, nausea, vomiting, or diarrhea. He is tolerating hyperbaric oxygen  therapy very well. 05/12/2019 on evaluation today patient presents for follow-up visit concerning his right gluteal fold ulcer. He did see Dr. Celine Ahr the surgeon who previously evaluated his wound and she actually felt like he was doing quite well with regard to the wound based on what she was seen. He does seem to be responding to some degree with the oral antibiotics along with hyperbaric oxygen therapy at this point she did not see any evidence or need for further surgical intervention at this point. She recommended deferring additional or ongoing antibiotic therapy to Dr. Steva Ready at her discretion. Fortunately there is no signs of active systemic infection. No fevers, chills, nausea, vomiting, or diarrhea. 10/29; patient I am seeing really for the first time today in terms of his wound. 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 He is being treated with hyperbaric oxygen for underlying osteomyelitis most recently documented by a three-phase bone scan. He has been on Levaquin for roughly a month and he is out of these and asked me for a refill. Most recent cultures were negative although I do not think these  were bone cultures. He has a very irregular surface to this wound which is almost nodular in texture. It undermines superiorly with the same nodular hyper granulated surface. I see that he was referred to general surgery for an operative debridement although the surgeon did not agree with this I think that would have been the proper course of action in this. He has been using Hydrofera Blue. He is supposed to start a wound VAC tomorrow which is actually a reapplication of wound VAC. I think mostly last time they had trouble keeping the seal in place I hope we have better look at this this time. 05/24/2019 on evaluation today I did review patient's note from Dr. Dellia Nims where he was seen last week when I was out of the office. Subsequently it does appear that Dr.  Dellia Nims was in agreement that the patient really needed debridement to clear away some of the nodular tissue in the base of the wound. The good news is he has the wound VAC initiated and some of this tissue is already starting to breakdown under the treatment of the wound VAC again because it is not really viable. CLAVIN, JOHNSEY ([id number]) Nonetheless I do believe that we can likely perform some debridement today to clear this away and hopefully the continuation of the wound VAC along with hyperbarics and antibiotic therapy will be beneficial in stopping this from reoccurring. If we get better control in the wound bed in a good spot I think we can definitely use the PriMatrix which we actually did get approval for. 05/31/2019 on evaluation today patient appears to be doing actually pretty well at this point with regard to his wound. He has been tolerating the dressing changes which is good news. Fortunately there is no signs of infection. He still has some of the hyper granular nodules noted we have not really cleared all these away and I think we can try to do a little bit more that today. I did do quite a bit last week I am hoping to get down to the base of the wound so though actually be able to heal more appropriately. I think the wound VAC is doing a good job right now with controlling the drainage and overall the patient is happy with that. 06/07/2019 upon evaluation today patient appears to be doing quite well compared to last week with regard to the hyper granulation in the base of the wound. He has been tolerating the dressing changes without complication. Fortunately there is no signs of active infection at this time. With that being said we have not had any recent measures as far as blood work is concerned I think we may want to check a few things including a CBC, sed rate, and C-reactive protein. Patient is in agreement with that we can try to get that scheduled for him for this  Friday. 06/13/2019 on evaluation today patient actually appears to be doing much better at this point based on what I am seeing with regard to his wound. He has much less in the way of hyper granular tissue which is good news and a lot more healing that is taken place since I last saw him as far as the amount of space within the wound itself. With that being said he is nearing the completion of the initial 40 treatments for hyperbaric oxygen therapy I think this is something I would recommend extending as he does seem to be benefiting at this time. Fortunately there is no  evidence of active systemic infection at this point nor even local infection for that matter. 06/20/2019 upon evaluation today patient actually appears to be doing well with regard to his wound. In fact this appears to be doing much better today compared to what it was previous. I been extremely pleased with the progress that he is made. In fact the tunnel is much less deep today than what it was in the past and I am seeing excellent signs of improvement. Overall I am happy with how things are going. I did review his white blood cell count which revealed everything to be okay. The one abnormal finding on his CBC was a hemoglobin of 12.7. Subsequently I did also review his sed rate and C-reactive protein. His sed rate measured in at normal level measuring at 26. The C-reactive protein however was elevated today and an abnormal range indicating inflammation. Still I feel like he is trending in a good direction at this time. He is going to need a refill of the Levaquin he tells me at this point. 07/04/2019 on evaluation today patient actually appears to be doing well in regard to his wound. He has been tolerating the dressing changes without complication. Fortunately there is no signs of active infection which is good news. No fevers, chills, nausea, vomiting, or diarrhea. The good news also is that I did review his labs and though  his sed rate was slightly elevated the C-reactive protein was actually down compared to the last evaluation 2 weeks ago this is excellent news I think it is showing signs of improvement. 07/11/2019 on evaluation today patient actually appears to be doing quite well with regard to his wound on my evaluation. This is very slow going but nonetheless he seems to be making progress. Especially in regard to the depth. Nonetheless I believe that we may want to consider going forward with the PriMatrix which was previously approved for him. We will get a have to get a new approval as we will actually be applying this in the new year so we will likely obtain that approval on January 4. In the meantime and for the time being my suggestion is going to be that we go ahead and continue with the wound VAC and we were going at this point with the current wound care measures. 12/28-Patient seen after HBO session today, wound depth with the tunnel measures 3.2 cm, the wound bed appears healthy, continuing with the VAC and HBO 07/25/2019 on evaluation today patient's wound appears to be doing about the same compared to my last evaluation. Fortunately there is no evidence of significant infection which is good news we will still waiting on the results of his lab work which unfortunately was not done prior to today's visit which is what we were aiming for. Ultimately there is no sign of active infection at this time which is good news. And he also tells me that the wound VAC has been staying in place and doing fairly well which is also good news. With that being said I am concerned about the fact that the wound has not progressed as much I think we may want to look into getting reapproval for the skin substitute, specifically PriMatrix, that I am hopeful will help to allow this area to granulate in more effectively. I would recommend as well continuing with the wound VAC along with this. 08/01/2019 on evaluation today  patient appears to be doing in my opinion slightly better in regard to the  wound. He actually does have some tightening of the walls around the edges which is a good thing. Fortunately there is no signs of active BURWELL, HOAGE. ([id number]) infection at this time. No fever chills noted. I do believe that the patient in general seems to be making progress although is very slow. Obviously were trying to get things to speed up somewhat more. No fevers, chills, nausea, vomiting, or diarrhea. 08/08/2019 upon evaluation today patient appears to be doing well with regard to his wound although there is not a significant improvement overall we do have the PriMatrix for availability today to apply. Fortunately there is no signs of active infection and the patient overall seems to be doing well. There is really no need for aggressive sharp debridement at this point either which is also good news. I do believe her get a need to suture and the primary checks in the basin in the PriMatrix in order to keep it in place at the base of the wound underneath the wound VAC. This was discussed with the patient today as well. He is in agreement the plan. He really does not have any filling in the area and so this should not be a complication as far as suturing is concerned. 08/15/2019 on evaluation today patient appears to be doing somewhat better in my opinion in regard to the overall appearance of his wound. He has been tolerating the dressing changes without complication. Fortunately the skin subappear to still be in place when this was changed on Friday. With that being said it appears that the PriMatrix may have dissolved in the interim between now and then as the sutures are no longer holding anything in place and again I would sutured it into place. Nonetheless I believe that we can go ahead and likely consider going forward that we may need to apply the PriMatrix weekly as opposed every other week as it does  seem to have dissolved rather readily and well. Overall the wound bed appears to be doing excellent at this time. 08/22/2019 upon evaluation today the patient does seem to making some progress with regard to his wound. Overall there is some better quality tissue and less of the hyper granular protrusions all of which is good news. With that being said we do have the PriMatrix available for application today and I am in a try to find a better way to suture this in place for him today. Electronic Signature(s) Signed: 08/22/2019 1:10:01 PM By: Worthy Keeler [name] Entered By: Worthy Keeler on 08/22/2019 13:10:01 CHAQUAN, ECKLEY ([id number]) -------------------------------------------------------------------------------- Physical Exam Details Patient Name: [name] Date of Service: 08/22/2019 8:00 AM Medical Record Number: [id number] Patient Account Number: [id number] Date of Birth/Sex: 07-Mar-1951 (68 y.o. M) Treating RN: [name] Primary Care Provider: [name] Other Clinician: Referring Provider: [name] Treating Provider/Extender: [name], Jamoni Broadfoot Weeks in Treatment: 41 Constitutional Well-nourished and well-hydrated in no acute distress. Respiratory normal breathing without difficulty. Psychiatric this patient is able to make decisions and demonstrates good insight into disease process. Alert and Oriented x 3. pleasant and cooperative. Notes Patient's wound currently showed signs again of a little bit of hyper granular protrusion specifically in roughly the 12:00 location where I did have to perform some debridement to clear away this protrusion. Subsequently post debridement I did go ahead and proceed with a reapplication of the PriMatrix which was applied down into the wound bed and covered with Mepitel. Subsequently I did actually end up suturing  the Mepitel into place with 4 simple interrupted sutures in order to maintain integrity and keep this in  place. The wound VAC should be applied over top of this and again this should not be removed by home health in the interim between now and when I see the patient back. Electronic Signature(s) Signed: 08/22/2019 1:10:48 PM By: Worthy Keeler [name] Entered By: Worthy Keeler on 08/22/2019 13:10:47 VIVIN, FLY ([id number]) -------------------------------------------------------------------------------- Physician Orders Details Patient Name: [name] Date of Service: 08/22/2019 8:00 AM Medical Record Number: [id number] Patient Account Number: [id number] Date of Birth/Sex: 1951-07-04 (68 y.o. 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 - in office o Cleanse wound with mild soap and water Skin Barriers/Peri-Wound Care o Skin Prep Primary Wound Dressing Wound #1 Right Gluteal fold o Saline moistened gauze - in clinic only o Other: - contact layer applied over top of Primatrix sutured to secure under wound vac Dressing Change Frequency Wound #1 Right Gluteal fold o Change Dressing Monday, Wednesday, Friday Follow-up Appointments Wound #1 Right Gluteal fold o Return Appointment in 1 week. Off-Loading Wound #1 Right Gluteal fold o Turn and reposition every 2 hours - no pressure on wounded area Home Health Wound #1 Right Gluteal fold o Continue Home Health Visits - Advanced: remove  dressing down to contact layer. Do not remove below the contact layer-Primatrix applied. o Home Health Nurse may visit PRN to address patientos wound care needs. o FACE TO FACE ENCOUNTER: MEDICARE and MEDICAID PATIENTS: I certify that this patient is under my care and that I had a face-to-face encounter that meets the physician face-to-face encounter requirements with this JATARIUS, HARRIEL ([id number]) patient on this date. The encounter with the patient was in whole or in part for the following MEDICAL CONDITION: (primary reason for [place]) MEDICAL NECESSITY: I certify, that based on my findings, NURSING services are a medically necessary home health service. HOME BOUND STATUS: I certify that my clinical findings support that this patient is homebound (i.e., Due to illness or injury, pt requires aid of supportive devices such as crutches, cane, wheelchairs, walkers, the use of special transportation or the assistance of another person to leave their place of residence. There is a normal inability to leave the home and doing so requires considerable and taxing effort. Other absences are for medical reasons / religious services and are infrequent or of short duration when for other reasons). o If current dressing causes regression in wound condition, may D/C ordered dressing product/s and apply Normal Saline Moist Dressing daily until next [place] / Other MD appointment. [place] of regression in wound condition at [phone]. o Please direct any NON-WOUND related issues/requests for orders to patient's Primary Care Physician Negative Pressure Wound Therapy Wound #1 Right Gluteal fold o Wound VAC settings at 125/130 mmHg continuous pressure. Use BLACK/GREEN foam to wound cavity. Use WHITE foam to fill any tunnel/s and/or undermining. Change VAC dressing 3 X WEEK. Change canister as indicated when full. Nurse may titrate settings and  frequency of dressing changes as clinically indicated. - Primatrix skin substitute applied in clinic. Sutured into wound tunnel at 12:00. Do not remove. Apply wound vac as normal, sponge over contact layer. o Home Health Nurse may d/c VAC for s/s of increased infection, significant wound regression, or uncontrolled drainage. [place] at [phone]. o Apply contact layer over base of wound. - contact layer over sutured Primatrix in tunnel at 12:00. o Number of foam/gauze pieces used in the dressing = Advanced Therapies Wound #1 Right Gluteal fold o Primatrix application in clinic; including contact layer, fixation with steri-strips/sutures, dry gauze and cover dressing. - in clinic. HH will apply wound vac. Medications-please add to medication list. Wound #1 Right Gluteal fold o P.O. Antibiotics - Continue antibiotics. Patient Medications Allergies: penicillin, doxycycline, clindamycin Notifications Medication Indication Start End Levaquin 08/25/2019 DOSE 1 - oral 500 mg tablet - 1 tablet oral taken daily for 14 days. do not take iron with this medication Electronic Signature(s) Signed: 08/25/2019 5:08:31 PM By: Worthy Keeler [name] Previous Signature: 08/22/2019 4:06:16 PM Version By: Worthy Keeler [name] Entered By: Worthy Keeler on 08/25/2019 17:08:28 SABDIEL, KERKSTRA ([id number]) -------------------------------------------------------------------------------- Problem List Details Patient Name: [name] Date of Service: 08/22/2019 8:00 AM Medical Record Number: [id number] Patient Account Number: [id number] Date of Birth/Sex: [birth year] (68 y.o. M) Treating RN: [name] Primary Care Provider: [name] Other Clinician: Referring Provider: [name] Treating Provider/Extender: [name], Keshana Klemz Weeks in Treatment: 90 Active Problems ICD-10 Evaluated Encounter Code Description Active Date Today Diagnosis L89.314 Pressure  ulcer of right buttock, stage 4 11/30/2017 No Yes M86.68 Other chronic osteomyelitis, other site 04/08/2019 No Yes L03.317 Cellulitis of buttock 06/21/2018 No Yes G82.20 Paraplegia, unspecified 11/30/2017 No Yes S34.109S Unspecified injury to unspecified level of lumbar spinal cord, 11/30/2017 No Yes sequela I10 Essential (primary) hypertension 11/30/2017 No Yes Inactive Problems Resolved Problems Electronic Signature(s) Signed: 08/22/2019 8:22:57 AM By: Worthy Keeler [name] Entered By: Worthy Keeler on 08/22/2019 08:22:57 TRACKER, MULLINGS ([id number]) -------------------------------------------------------------------------------- Progress Note Details Patient Name: [name] Date of Service: 08/22/2019 8:00 AM Medical Record Number: [id number] Patient Account Number: [id number] Date of Birth/Sex: 1951/06/08 (68 y.o. 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 He states that there is no history of diabetes that he is aware of. He does have issues with his prostate and is currently receiving treatment for this by way of oral medication. With that being said I do not have a lot of details in that regard. Nonetheless the patient presents today as a result of having been referred to [place] by another provider initially home health was set to come out and take care of his wound although due to the fact that he apparently drives he's not able to receive home health. His  wife is therefore trying to help take care of this wound within although they have been struggling with what exactly to do at this point. She states that she can do some things but she is definitely not a nurse  and does have some issues with looking at blood. The good news is the wound does not appear to be too deep and is fairly superficial at this point. There is no slough noted there is some nonviable skin noted around the surface of the wound and the perimeter at this point. The central portion of the wound appears to be very good with a dermal layer noted this does not appear to be again deep enough to extend it to subcutaneous tissue at this point. 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 In the pump he has Dilaudid and baclofen. 12/07/17 on evaluation today patient presents for follow-up concerning his ongoing lower back thigh ulcer on the right. He states that he did not get the supplies ordered and therefore has not really been able to perform the dressing changes as directed exactly. His wife was able to get some Boarder Foam Dressing's from the drugstore and subsequently has been using hydrogel which did help to a degree in the wound does appear to be able smaller. There is actually more drainage this week noted than previous. 12/21/17 on evaluation today patient appears to be doing rather well in regard to his right gluteal ulcer. He has been tolerating the dressing changes without complication. There does not appear to be any evidence of infection at this point in time. Overall the wound does seem to be making some progress as far as the edges are concerned there's not as much in the way of overlapping of the external wound edges and he has a good epithelium to wound bed border for the most part. This however is not true right at the 12 o'clock location over the span of a little over  a centimeters which actually will require debridement today to clean this away and hopefully allow it to continue to heal more appropriately. 12/28/17 on evaluation today patient appears to be doing rather well in regard to his ulcer in the left gluteal region. He's been tolerating the dressing changes without complication. Apparently he has had some difficulty getting his dressing material. Apparently there's been some confusion with ordering we're gonna check into this. Nonetheless overall he's been showing signs of improvement which is good news. Debridement is not required today. 01/04/18 on evaluation today patient presents for follow-up concerning his right gluteal ulcer. He has been tolerating the dressing changes fairly well. On inspection today it appears he may actually have some maceration them concerned about the fact that he may be developing too much moisture in and around the wound bed which can cause delay in healing. With that being said he unfortunately really has not showed significant signs of improvement since last week's evaluation in fact this may even be just the little bit/slightly larger. Nonetheless he's been having a lot of discomfort I'm not sure this is even related to the wound as he has no pain when I'm to breeding or otherwise cleaning the wound during evaluation today. Nonetheless this is something that we did recommend he talked to his pain specialist concerning. 01/11/18 on evaluation today patient appears to be doing better in regard to his ulceration. He has been tolerating the dressing Pumphrey, Zahmir J. ([id number]) changes without complication. With that being said overall there's no evidence of infection which is good news. The only thing is he did receive the hatch affair blue classic versus the ready nonetheless I feel  like this is perfectly fine and appears to have done well for him over the past week. 01/25/18 on evaluation today patient's wound actually  appears to be a little bit larger than during the last evaluation. The good news is the majority of the wound edges actually appear to be fairly firmly attached to the wound bed unfortunately again we're not really making progress in regard to the size. Roughly the wound is about the same size as when I first saw him although again the wound margin/edges appear to be much better. 02/01/18 on evaluation today patient actually appears to be doing very well in regard to his wound. Applying the Prisma dry does seem to be better although he does still have issues with slow progression of the wound. There was a slight improvement compared to last week's measurements today. Nonetheless I have been considering other options as far as the possibility of Theraskin or even a snap vac. In general I'm not sure that the Theraskin due to location of the wound would be a very good idea. Nonetheless I do think that a snap vac could be a possibility for the patient and in fact I think this could even be an excellent way to manage the wound possibly seeing some improvement in a very rapid fashion here. Nonetheless this is something that we would need to get approved and I did have a lengthy conversation with the patient about this today. 02/08/18 on evaluation today patient appears to be doing a little better in regard to his ulcer. He has been tolerating the dressing changes without complication. Fortunately despite the fact that the wound is a little bit smaller it's not significantly so unfortunately. We have discussed the possibility of a snap vac we did check with insurance this is actually covered at this point. Fortunately there does not appear to be any sign of infection. Overall I'm fairly pleased with how things seem to be appearing at this point. 02/15/18 on evaluation today patient appears to be doing rather well in regard to his right gluteal ulcer. Unfortunately the snap vac did not stay in place with his  sheer and friction this came loose and did not seem to maintain seal very well. He worked for about two days and it did seem to do very well during that time according to his wife but in general this does not seem to be something that's gonna be beneficial for him long-term. I do believe we need to go back to standard dressings to see if we can find something that will be of benefit. 03/02/18- He is here in follow up evaluation; there is minimal change in the wound. He will continue with the same treatment plan, would consider changing to iodosrob/iodoflex if ulcer continues to to plateau. He will follow up next week 03/08/18 on evaluation today patient's wound actually appears to be about the same size as when I previously saw him several weeks back. Unfortunately he does have some slightly dark discoloration in the central portion of the wound which has me concerned about pressure injury. I do believe he may be sitting for too long a period of time in fact he tells me that "I probably sit for much too long". He does have some Slough noted on the surface of the wound and again as far as the size of the wound is concerned I'm really not seeing anything that seems to have improved significantly. 03/15/18 on evaluation today patient appears to be doing fairly well  in regard to his ulcer. The wound measured pretty much about the same today compared to last week's evaluation when looking at his graph. With that being said the area of bruising/deep tissue injury that was noted last week I do not see at this point. He did get a new cushion fortunately this does seem to be have been of benefit in my pinion. It does appear that he's been off of this more which is good news as well I think that is definitely showing in the overall wound measurements. With that being said I do believe that he needs to continue to offload I don't think that the fact this is doing better should be or is going to allow him to not  have to offload and explain this to him as well. Overall he seems to be in agreement the plan I think he understands. The overall appearance of the wound bed is improved compared to last week I think the Iodoflex has been beneficial in that regard. 03/29/18 on evaluation today patient actually appears to be doing rather well in regard to his wound from the overall appearance standpoint he does have some granulation although there's some Slough on the surface of the wound noted as well. With that being said he unfortunately has not improved in regard to the overall measurement of the wound in volume or in size. I did have a discussion with him very specifically about offloading today. He actually does work although he mainly is just sitting throughout the day. He tells me he offloads by "lifting himself up for 30 seconds off of his chair occasionally" purchase from [employer] which does seem to have helped. And he has a new cushion that he with that being said he's also able to stand some for a very short period of time but not significant enough I think to provide appropriate offloading. I think the biggest issue at this point with the wound and the fact is not healing as quickly as we would like is due to the fact that he is really not able to appropriately offload while at work. He states the beginning after his injury he actually had a bed at his job that he could lay on in order to offload and that does seem to have been of help back at that time. Nonetheless he had not done this in quite some time unfortunately. I think that could be helpful for him this is something I would like for him to look into. VINCENZO, WAMPLER ([id number]) 04/05/18 on evaluation today patient actually presents for follow-up concerning his right gluteal ulcer. Again he really is not significantly improved even compared to last week. He has been tolerating the dressing changes without complication. With that being  said fortunately there appears to be no evidence of infection at this time. He has been more proactive in trying to offload. 04/12/18 on evaluation today patient actually appears to be doing a little better in regard to his wound and the right gluteal fold region. He's been tolerating the dressing changes since removing the oasis without complication. However he was having a lot of burning initially with the oasis in place. He's unsure of exactly why this was given so much discomfort but he assumes that it was the oasis itself causing the problem. Nonetheless this had to be removed after about three days in place although even those three days seem to have made a fairly good improvement in regard to the overall appearance of  the wound bed. In fact is the first time that he's made any improvement from the standpoint of measurements in about six weeks. He continues to have no discomfort over the area of the wound itself which leads me to wonder why he was having the burning with the oasis when he does not even feel the actual debridement's themselves. I am somewhat perplexed by this. 04/19/18 on evaluation today patient's wound actually appears to be showing signs of epithelialization around the edge of the wound and in general actually appears to be doing better which is good news. He did have the same burning after about three days with applying the Endoform last week in the same fashion that I would generally apply a skin substitute. This seems to indicate that it's not the oasis to cause the problem but potentially the moisture buildup that just causes things to burn or there may be some other reaction with the skin prep or Steri-Strips. Nonetheless I'm not sure that is gonna be able to tolerate any skin substitute for a long period of time. The good news is the wound actually appears to be doing better today compared to last week and does seem to finally be making some progress. 04/26/18 on  evaluation today patient actually appears to be doing rather well in regard to his ulcer in the right gluteal fold. He has been tolerating the dressing changes without complication which is good news. The Endoform does seem to be helping although he was a little bit more macerated this week. This seems to be an ongoing issue with fluid control at this point. Nonetheless I think we may be able to add something like Drawtex to help control the drainage. 05/03/18 on evaluation today patient appears to actually be doing better in regard to the overall appearance of his wound. He has been tolerating the dressing changes without complication. Fortunately there appears to be no evidence of infection at this time. I really feel like his wound has shown signs as of today of turning around last week I thought so as well and definitely he could be seen in this week's overall appearance and measurements. In general I'm very pleased with the fact that he finally seems to be making a steady but sure progress. The patient likewise is very pleased. 05/17/18 on evaluation today patient appears to be doing more poorly unfortunately in regard to his ulcer. He has been tolerating the dressing changes without complication. With that being said he tells me that in the past couple of days he and his wife have noticed that we did not seem to be doing quite as well is getting dark near the center. Subsequently upon evaluation today the wound actually does appear to be doing worse compared to previous. He has been tolerating the dressing changes otherwise and he states that he is not been sitting up anymore than he was in the past from what he tells me. Still he has continued to work he states "I'm tired of dealing with this and if I have to just go home and lay in the bed all the time that's what I'll do". Nonetheless I am concerned about the fact that this wound does appear to be deeper than what it was previous. 05/24/18  upon evaluation today patient actually presents after having been in the hospital due to what was presumed to be sepsis secondary to the wound infection. He had an elevated white blood cell count between 14 and 15. With that being  said he does seem to be doing somewhat better now. His wound still is giving him some trouble nonetheless and he is obviously concerned about the fact likely talked about that this does seem to go more deeply than previously noted. I did review his wound culture which showed evidence of Staphylococcus aureus him and group B strep. Nonetheless he is on antibiotics, Levaquin, for this. Subsequently I did review his intake summary from the hospital as well. I also did look at the CT of the lumbar spine with contrast that was performed which showed no bone destruction to suggest lumbar disguises/osteomyelitis or sacral osteomyelitis. There was no paraspinal abscess. Nonetheless it appears this may have been more of just a soft tissue infection at this point which is good news. He still is nonetheless concerned about the wound which again I think is completely reasonable considering everything he's been through recently. 05/31/18 on evaluation today on evaluation today patient actually appears to be showing signs of his wound be a little bit deeper than what I would like to see. Fortunately he does not show any signs of significant infection although his temperature was 99 today he states he's been checking this at home and has not been elevated. Nonetheless with the undermining that I'm seeing at this point I am becoming more concerned about the wound I do think that offloading is a key factor here that is preventing the speedy recovery at this point. There does not appear to be any evidence of again over infection noted. He's been using Santyl currently. ABDO, HABEEB ([id number]) 06/07/18 the patient presents today for follow-up evaluation regarding the left ulcer in  the gluteal region. He has been tolerating the Wound VAC fairly well. He is obviously very frustrated with this he states that to mean is really getting in his way. There does not appear to be any evidence of infection at this time he does have a little bit of odor I do not necessarily associate this with infection just something that we sometimes notice with Wound VAC therapy. With that being said I can definitely catch a tone of discontentment overall in the patient's demeanor today. This when he was previously in the hospital an CT scan was done of the lumbar region which did not reveal any signs of osteomyelitis. With that being said the pelvis in particular was not evaluated distinctly which means he could still have some osteonecrosis I. Nonetheless the Wound VAC was started on Thursday I do want to get this little bit more time before jumping to a CT scan of the pelvis although that is something that I might would recommend if were not see an improvement by that time. 06/14/18 on evaluation today patient actually appears to be doing about the same in regard to his right gluteal ulcer. Again he did have a CT scan of the lumbar spine unfortunately this did not include the pelvis. Nonetheless with the depth of the wound that I'm seeing today even despite the fact that I'm not seeing any evidence of overt cellulitis I believe there's a good chance that we may be dealing with osteomyelitis somewhere in the right Ischial region. No fevers, chills, nausea, or vomiting noted at this time. 06/21/18 on evaluation today patient actually appears to be doing about the same with regard to his wound. The tunnel at 6 o'clock really does not appear to be any deeper although it is a little bit wider. I think at this point you may want to  start packing this with white phone. Unfortunately I have not got approval for the CT scan of the pelvis as of yet due to the fact that Medicare apparently has been denied it  due to the diagnosis codes not being appropriate according to Medicare for the test requested. With that being said the patient cannot have an MRI and therefore this is the only option that we have as far as testing is concerned. The patient has had infection and was on antibiotics and been added code for cellulitis of the bottom to see if this will be appropriate for getting the test approved. Nonetheless I'm concerned about the infection have been spread deeper into the Ischial region. 06/28/18 on evaluation today patient actually appears to be doing rather well all things considered in regard to the right gluteal ulcer. He has been tolerating the dressing changes without complication. With that being said the Wound VAC he states does have to be replaced almost every day or at least reinforced unfortunately. Patient actually has his CT scan later this morning we should have the results by tomorrow. 07/05/18 on evaluation today patient presents for follow-up concerning his right Ischial ulcer. He did see the surgeon Dr. Lysle Pearl last week. They were actually very happy with him and felt like he spent a tremendous amount of time with them as far as discussing his situation was concerned. In the end Dr. Lysle Pearl did contact me as well and determine that he would not recommend any surgical intervention at this point as he felt like it would not be in the patient's best interest based on what he was seeing. He recommended a referral to infectious disease. Subsequently this is something that Dr. Ines Bloomer office is working on setting up for the patient. As far as evaluation today is concerned the patient's wound actually appears to be worse at this point. I am concerned about how things are progressing and specifically about infection. I do not feel like it's the deeper but the area of depth is definitely widening which does have me concerned. No fevers, chills, nausea, or vomiting noted at this time. I think  that we do need initiate antibiotic therapy the patient has an allow allergy to amoxicillin/penicillin he states that he gets a rash since childhood. Nonetheless she's never had the issues with Catholics or cephalosporins in general but he is aware of. 07/27/18 on evaluation today patient presents following admission to the hospital on 07/09/18. He was subsequently discharged on 07/20/18. On 07/15/18 the patient underwent irrigation and debridement was soft tissue biopsy and bone biopsy as well as placement of a Wound VAC in the OR by Dr. Celine Ahr. During the hospital course the patient was placed on a Wound VAC and recommended follow up with surgery in three weeks actually with Dr. Delaine Lame who is infectious disease. The patient was on vancomycin during the hospital course. 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 He also had a bone culture which revealed evidence of methicillin-resistant staph aureus. He is updated CT scan 07/09/18 reveals that he had progression of the which was performed on wound to breakdown down to the trochanter where he actually had irregularities there as well suggestive of osteomyelitis. This was a change just since 9 December when we last performed a CT scan. Obviously this one had gone downhill quite significantly and rapidly. At this point upon evaluation I feel like in general the patient's wound seems to be doing fairly well all things considered  upon my evaluation today. Obviously this is larger and deeper than what I previously evaluated but at the same time he seems to be making some progress as far as the appearance of the granulation tissue is concerned. I'm happy in that regard. No fevers, chills, nausea, or vomiting noted at this time. He is on IV vancomycin and Rocephin at the facility. He is currently in [facility]. 08/03/18 upon evaluation today patient's wound appears to be doing better in regard to the overall  appearance at this point in time. Fortunately he's been tolerating the Wound VAC without complication and states that the facility has been taking AJAVION, FELTUS. ([id number]) excellent care of the wound site. Overall I see some Slough noted on the surface which I am going to attempt sharp debridement today of but nonetheless other than this I feel like he's making progress. 08/09/18 on evaluation today patient's wound appears to be doing much better compared to even last week's evaluation. Do believe that the Wound VAC is been of great benefit for him. He has been tolerating the dressing changes that is the Wound VAC without any complication and he has excellent granulation noted currently. There is no need for sharp debridement at this point. 08/16/18 on evaluation today patient actually appears to be doing very well in regard to the wound in the right gluteal fold region. This is showing signs of progress and again appears to be very healthy which is excellent news. Fortunately there is no sign of active infection by way of odor or drainage at this point. Overall I'm very pleased with how things stand. He seems to be tolerating the Wound VAC without complication. 08/23/18 on evaluation today patient actually appears to be doing better in regard to his wound. He has been tolerating the Wound VAC without complication and in fact it has been collecting a significant amount of drainage which I think is good news especially considering how the wound appears. Fortunately there is no signs of infection at this time definitely nothing appears to be worse which is good news. He has not been started on the Bactrim and Flagyl that was recommended by Dr. Delaine Lame yet. I did actually contact her office this morning in order to check and see were things are that regard their gonna be calling me back. 08/30/18 on evaluation today patient actually appears to show signs of excellent improvement today compared  to last evaluation. The undermining is getting much better the wound seems to be feeling quite nicely and I'm very pleased that the granulation in general. With that being said overall I feel like the patient has made excellent progress which is great news. No fevers, chills, nausea, or vomiting noted at this time. 09/06/18 on evaluation today patient actually appears to be doing rather well in regard to his right gluteal ulcer. This is showing signs of improvement in overall I'm very pleased with how things seem to be progressing. The patient likewise is please. Overall I see no evidence of infection he is about to complete his oral antibiotic regimen which is the end of the antibiotics for him in just about three days. 09/13/18 on evaluation today patient's right Ischial ulcer appears to be showing signs of continued improvement which is excellent news. He's been tolerating the dressing changes without complication. Fortunately there's no signs of infection and the wound that seems to be doing very well. 09/28/18 on evaluation today patient appears to be doing rather well in regard to his right  Ischial ulcer. He's been tolerating the Wound VAC without complication he knows there's much less drainage than there used to be this obviously is not a bad thing in my pinion. There's no evidence of infection despite the fact is but nothing about it now for several weeks. 10/04/18 on evaluation today patient appears to be doing better in regard to his right Ischial wound. He has been tolerating the Wound VAC without complication and I do believe that the silver nitrate last week was beneficial for him. Fortunately overall there's no evidence of active infection at this time which is great news. No fevers, chills, nausea, or vomiting noted at this time. 10/11/18 on evaluation today patient actually appears to be doing rather well in regard to his Ischial ulcer. He's been tolerating the Wound VAC still without  complication I feel like this is doing a good job. No fevers, chills, nausea, or vomiting noted at this time. 11/01/18 on evaluation today patient presents after having not been seen in our clinic for several weeks secondary to the fact that he was on evaluation today patient presents after having not been seen in our clinic for several weeks secondary to the fact that he was in a skilled nursing facility which was on lockdown currently due to the covert 19 national emergency. Subsequently he was discharged from the facility on this past Friday and subsequently made an appointment to come in to see yesterday. Fortunately there's no signs of active infection at this time which is good news and overall he does seem to have made progress since I last saw. Overall I feel like things are progressing quite nicely. The patient is having no pain. 11/08/18 on evaluation today patient appears to be doing okay in regard to his right gluteal ulcer. He has been utilizing a Wound VAC home health this changing this at this point since he's home from the skilled nursing facility. Fortunately there's no signs of obvious active infection at this time. Unfortunately though there's no obvious active infection he is having some maceration and his wife states that when the sheets of the Wound VAC office on Sunday when it broke seal that he ended up having significant issues with some smell as well there concerned about the possibility of infection. Fortunately there's No fevers, chills, nausea, or vomiting noted at this time. BRASON, CHASON ([id number]) 11/15/18 on evaluation today patient actually appears to be doing well in regard to his right gluteal ulcer. He has been tolerating the dressing changes without complication. Specifically the Wound VAC has been utilized up to this point. Fortunately there's no signs of infection and overall I feel like he has made progress even since last week when I last saw him. I'm  actually fairly happy with the overall appearance although he does seem to have somewhat of a hyper granular overgrowth in the central portion of the wound which I think may require some sharp debridement to try flatness out possibly utilizing chemical cauterization following. 11/23/18 on evaluation today patient actually appears to be doing very well in regard to his sacral ulcer. He seems to be showing signs of improvement with good granulation. With that being said he still has the small area of hyper granulation right in the central portion of the wound which I'm gonna likely utilize silver nitrate on today. Subsequently he also keeps having a leak at the 6 o'clock location which is unfortunate we may be able to help out with some suggestions to try to prevent  this going forward. Fortunately there's no signs of active infection at this time. 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 He's been tolerating the dressing changes without complication. Fortunately there's no signs of active infection at this time. I've been rather pleased with how things have progressed there still some evidence of pressure getting to the area with some redness right around the immediate wound opening. Nonetheless other than this I'm not seeing any significant complications or issues the wound is somewhat hyper granular. Upon discussing with the patient and his wife today I'm not sure that the wound is being packed to the base with the foam at this point. And if it's not been packed fully that may be part of the reason why is not seen as much improvement as far as the granulation from the base out. Again we do not want pack too tightly but we need some of the firm to get to the base of the wound. I discussed this with patient and his wife today. 12/06/18 on evaluation today patient appears to be doing well in regard to his  right gluteal pressure ulcer. He's been tolerating the dressing changes without complication. Fortunately there's no signs of active infection. He still has some hyper granular tissue and I do think it would be appropriate to continue with the chemical cauterization as of today. 12/16/18 on evaluation today patient actually appears to be doing okay in regard to his right gluteal ulcer. He is been tolerating the dressing changes without complication including the Wound VAC. Overall I feel like nothing seems to be worsening I do feel like that the hyper granulation buds in the central portion of the wound have improved to some degree with the silver nitrate. We will have to see how things continue to progress. 12/20/18 on evaluation today patient actually appears to be doing much worse in my pinion even compared to last week's evaluation. Unfortunately as opposed to showing any signs of improvement the areas of hyper granular tissue in the central portion of the wound seem to be getting worse. Subsequently the wound bed itself also seems to be getting deeper even compared to last week which is both unusual as well as concerning since prior he had been shown signs of improvement. Nonetheless I think that the issue could be that he's actually having some difficulty in issues with a deeper infection. There's no external signs of infection but nonetheless I am more worried about the internal, osteomyelitis, that could be restarting. He has not been on antibiotics for some time at this point. I think that it may be a good idea to go ahead and started back on an antibiotic therapy while we wait to see what the testing shows. 12/27/18 on evaluation today patient presents for follow-up concerning his left gluteal fold wound. Fortunately he appears to be doing well today. I did review the CT scan which was negative for any signs of osteomyelitis or acute abnormality this is excellent news. Overall I feel like the  surface of the wound bed appears to be doing significantly better today compared to previously noted findings. There does not appear any signs of infection nor does he have any pain at this time. 01/03/19 on evaluation today patient actually appears to be doing quite well in regard to his ulcer. Post debridement last week he really did not have too much bleeding which is good news.  Fortunately today this seems to be doing some better but we still has some of the hyper granular tissue noted in the base of the wound which is gonna require sharp debridement today as well. Overall I'm pleased with how things seem to be progressing since we switched away from the Wound VAC I think he is making some progress. 01/10/19 on evaluation today patient appears to be doing better in regard to his right gluteal fold ulcer. He has been tolerating the dressing changes without complication. The debridement to seem to be helping with current away some of the poor hyper granular tissue bugs throughout the region of his gluteal fold wound. He's been tolerating the dressing changes otherwise without complication which is great news. No fevers, chills, nausea, or vomiting noted at this time. 01/17/19 on evaluation today patient actually appears to be doing excellent in regard to his wound. He's been tolerating the dressing changes without complication. Fortunately there is no signs of active infection at this time which is great news. No fevers, chills, nausea, or vomiting noted at this time. LUNDON, SPORT ([id number]) 01/24/19 on evaluation today patient actually appears to be doing quite well with regard to his ulcer. He has been tolerating the dressing changes without complication. Fortunately there's no signs of active infection at this time. Overall been very pleased with the progress that he seems to be making currently. 01/31/19-Patient returns at 1 week with apparent similarity in dimensions to the wound, with no  signs of infection, he has been changing dressings twice a day 02/08/19 upon evaluation today patient actually appears to be doing well with regard to his right Ischial ulcer. The wound is not appear to be quite as deep and seems to be making progress which is good news. With that being said I'm still reluctant to go back to the Wound VAC at this point. He's been having to change the dressings twice a day which is a little bit much in my pinion from the wound care supplies standpoint. I think that possibly attempting to utilize extras orbit may be beneficial this may also help to prevent any additional breakdown secondary to fluid retention in the wound itself. The patient is in agreement with giving this a try. 02/15/19 on evaluation today patient actually appears to be doing decently well with regard to his ulcer in the right to gluteal fold location. He's been tolerating the dressing changes without complication. Fortunately there is no signs of active infection at this time. He is able to keep the current dressing in place more effectively for a day at a time whereas before he was having a changes to to three times a day. The actions or has been helpful in this regard. Fortunately there's no signs of anything getting worse and I do feel like he showing signs of good improvement with regard to the wound bed status. 02/22/2019 patient appears to be doing very well today with regard to his ulcer in the gluteal fold. Fortunately there is no signs of active infection and he has been tolerating the dressing changes without any complication. Overall extremely pleased with how things seem to be progressing. He has much less of the hyper granular projections within the wound these have slowly been debrided away and he seems to be doing well. The wound bed is more uniform. 03/01/19 on evaluation today patient appears to be doing unfortunately about the same in regard to his gluteal ulcer. He's been tolerating  the dressing changes without complication.  Fortunately there's no signs of active infection at this time. With that being said he continues to develop these hyper granular projections which I'm unsure of exactly what they are and why they are rising. Nonetheless I explained to the patient that I do believe it would be a good idea for [place] to stand a biopsy sample for pathology to see if that can shed any light on what exactly may be going on here. Fortunately I do not see any obvious signs of infection. With that being said the patient has had a little bit more drainage this week apparently compared to last week. 03/08/2019 on evaluation today patient actually appears to look somewhat better with regard to the appearance of his wound bed at this time. This is good news. Overall I am very pleased with how things seem to have progressed just in the past week with a switch to the [facility] dressing. I think that has been beneficial for him. With that being said at this time the patient is concerned about his biopsy that I sent off last week unfortunately I do not have that report as of yet. Nonetheless we have called to obtain this and hopefully will hear back from the lab later this morning. 03/15/19 on evaluation today patient's wound actually appears to be doing okay today with regard to the overall appearance of the wound bed. He has been tolerating the dressing changes without complication he still has hyper granular tissue noted but fortunately that seems to be minimal at this point compared to some of what we've seen in the past. Nonetheless I do think that he is still having some issues currently with some of this type of granulation the biopsy and since all showed nothing more than just evidence of granulation tissue. Therefore there really is nothing different to initiate or do at this point. 03/24/19 on evaluation today patient appears to be doing a little better with regard to his ulcer. He's  been tolerating the dressing changes without complication. Fortunately there is no signs of active infection at this time. No fevers, chills, nausea, or vomiting noted at this time. I'm overall pleased with how things seem to be progressing. 03/29/2019 on evaluation today patient appears to be doing about the same in regard to his ulcer in the right gluteal fold. Unfortunately he is not seeming to make a lot of progress and the wound is somewhat stalled. There is no signs of active infection externally but I am concerned about the possibility of infection continuing in the ischial location which previously he did have infection noted. Again is not able to have an MRI so probably our best option for testing for this would be a triple phase bone scan which will detect subtle changes in the bone more so than plain film x-rays. In the past they really have not been beneficial and in fact the CT scans even have been somewhat questionable at times. Nonetheless there is no signs of systemic infection which is at least good news but again his wound is not healing at all on the predicted schedule. 04/05/19 on evaluation today patient appears to be doing well all things considering with regard to his wound and the right gluteal fold. He actually has his triple bone scan scheduled for sometime in the next couple of days. With that being said I've also been looking to other possibilities of what could be causing hyper granular tissue were looking into the bone scan again for evaluating for the  risk or possibility of infection deeper and I'm also gonna go ahead and see about obtaining a deep tissue Shipman, Ivan J. ([id number]) culture today to send and see if there's any evidence of infection noted on culture. He's in agreement with that plan. 04/12/2019 on evaluation today patient presents for reevaluation here in our clinic concerning ongoing issues with his right gluteal fold ulcer. 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 It was discussed with him at that point that I think it would be appropriate for him to proceed with hyperbaric oxygen therapy especially in light of the fact that he is previously been on IV antibiotics at the beginning of the year for close to 3 months followed by several weeks of oral antibiotics that was all prescribed by infectious disease. He had surgical debridement around Christmas December 2019 due to an abscess and osteomyelitis of the ischial bone. Unfortunately this has not really proceeded as well as we would have liked and again we did a CT scan even a couple months ago as he cannot have an MRI secondary to having issues with both a pain pump as well as a spinal cord stimulator which prevent him from going into an MRI machine. With that being said there were chronic changes noted at the ischial tuberosity which had progressed since December 2019 there was no evidence of fluid collection on that initial CT scan. With that being said that was on January 21, 2019. I am not sure that it was a sensitive enough test as compared to an MRI and then subsequently I ordered a bone scan for him which was actually completed on 03/29/2019 and this revealed that he does indeed have positive osteomyelitis involving the right ischial tuberosity. This is adjacent to the ulcer and I think is the reason that his ulcer is not healing. Subsequently I am in a place him back on oral antibiotics today unfortunately his wound culture showed just mixed gram-negative flora with no specific findings of a predominant organism. Nonetheless being with the location I think a good broad-spectrum antibiotic for gram-negative's is a good choice at this point he is previously taken Levaquin without significant issues and I think that is an appropriate antibiotic for him at this  time. 04/19/2019 on evaluation today patient actually appears to be doing fairly well with regard to his wound. He has been tolerating the dressing changes without complication. Fortunately there is no signs of active infection and he has been taking the oral antibiotics at this time. Subsequently we did make a referral to infectious disease although Dr. Steva Ready wants the patient to be seen by general surgery first in order apparently to see if there is anything from a surgical standpoint that should be done prior to initiation of IV antibiotic therapy. Again the patient is okay with actually seeing Dr. Celine Ahr whom he has seen before we will make that referral. 04/26/2019 on evaluation today patient actually appears to be doing well with regard to his wound. He has been tolerating the dressing changes without complication. Fortunately there is no signs of active infection at this time. I do believe that the hyperbaric oxygen therapy along with the antibiotics which I prescribed at this point have been doing well for him. With that being said he has not seen Dr. Celine Ahr the surgeon yet in fact they have not been contacted for scheduling an  appointment as of yet. Subsequently the patient is not on antibiotics currently by IV Dr. Steva Ready did want him to see Dr. Celine Ahr first which we are working on trying to get scheduled. We again give the information to the patient today for Dr. Celine Ahr and her number as far as contacting their office to see about getting something scheduled. Again were looking for whether or not they recommend any surgical intervention. 05/03/2019 on evaluation today patient appears to be doing about the same with regard to his wound there may be a little bit of filling in in regard to the base of the wound but still he has quite a bit of hyper granular tissue that I am seeing today. I believe that he may may need a more aggressive sharp debridement possibly even by the surgeon or  under anesthesia in order to clear away some of the hyper granular material in order to help allow for appropriate granulation to fill in. We have made a referral to Dr. Celine Ahr unfortunately it sounds as if they did not receive the referral we contacted them today they should be get in touch with the patient. Depending on how things show from that standpoint the patient may need to see Dr. Ardyth Gal who is the infectious disease specialist although he really does not want a PICC line again. No fevers, chills, nausea, vomiting, or diarrhea. He is tolerating hyperbaric oxygen therapy very well. 05/12/2019 on evaluation today patient presents for follow-up visit concerning his right gluteal fold ulcer. He did see Dr. Celine Ahr the surgeon who previously evaluated his wound and she actually felt like he was doing quite well with regard to the wound based on what she was seen. He does seem to be responding to some degree with the oral antibiotics along with hyperbaric oxygen therapy at this point she did not see any evidence or need for further surgical intervention at this point. She recommended deferring additional or ongoing antibiotic therapy to Dr. Steva Ready at her discretion. Fortunately there is no signs of active systemic infection. No fevers, chills, nausea, vomiting, or diarrhea. 10/29; patient I am seeing really for the first time today in terms of his wound. 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 He is being treated with hyperbaric oxygen for underlying osteomyelitis most recently documented by a three-phase bone scan. He has been on Levaquin for roughly a month and he is out of these and asked me for a refill. Most recent cultures were negative although I do not think these were bone cultures. He has a very irregular surface to this wound which is almost nodular in texture. It undermines superiorly with the same nodular hyper granulated surface. I see that he  was referred to general surgery for an operative debridement although the surgeon did not agree with this I think that would have been the proper course of action in this. He has been using Hydrofera Blue. He is supposed to start a wound VAC tomorrow which is actually a reapplication of wound VAC. I think mostly last time they had trouble keeping the seal in place I hope we have better look at this this time. RONELL, CARLON ([id number]) 05/24/2019 on evaluation today I did review patient's note from Dr. Dellia Nims where he was seen last week when I was out of the office. Subsequently it does appear that Dr. Dellia Nims was in agreement that the patient really needed debridement to clear away some of the nodular tissue in the  base of the wound. The good news is he has the wound VAC initiated and some of this tissue is already starting to breakdown under the treatment of the wound VAC again because it is not really viable. Nonetheless I do believe that we can likely perform some debridement today to clear this away and hopefully the continuation of the wound VAC along with hyperbarics and antibiotic therapy will be beneficial in stopping this from reoccurring. If we get better control in the wound bed in a good spot I think we can definitely use the PriMatrix which we actually did get approval for. 05/31/2019 on evaluation today patient appears to be doing actually pretty well at this point with regard to his wound. He has been tolerating the dressing changes which is good news. Fortunately there is no signs of infection. He still has some of the hyper granular nodules noted we have not really cleared all these away and I think we can try to do a little bit more that today. I did do quite a bit last week I am hoping to get down to the base of the wound so though actually be able to heal more appropriately. I think the wound VAC is doing a good job right now with controlling the drainage and overall the  patient is happy with that. 06/07/2019 upon evaluation today patient appears to be doing quite well compared to last week with regard to the hyper granulation in the base of the wound. He has been tolerating the dressing changes without complication. Fortunately there is no signs of active infection at this time. With that being said we have not had any recent measures as far as blood work is concerned I think we may want to check a few things including a CBC, sed rate, and C-reactive protein. Patient is in agreement with that we can try to get that scheduled for him for this Friday. 06/13/2019 on evaluation today patient actually appears to be doing much better at this point based on what I am seeing with regard to his wound. He has much less in the way of hyper granular tissue which is good news and a lot more healing that is taken place since I last saw him as far as the amount of space within the wound itself. With that being said he is nearing the completion of the initial 40 treatments for hyperbaric oxygen therapy I think this is something I would recommend extending as he does seem to be benefiting at this time. Fortunately there is no evidence of active systemic infection at this point nor even local infection for that matter. 06/20/2019 upon evaluation today patient actually appears to be doing well with regard to his wound. In fact this appears to be doing much better today compared to what it was previous. I been extremely pleased with the progress that he is made. In fact the tunnel is much less deep today than what it was in the past and I am seeing excellent signs of improvement. Overall I am happy with how things are going. I did review his white blood cell count which revealed everything to be okay. The one abnormal finding on his CBC was a hemoglobin of 12.7. Subsequently I did also review his sed rate and C-reactive protein. His sed rate measured in at normal level measuring at  26. The C-reactive protein however was elevated today and an abnormal range indicating inflammation. Still I feel like he is trending in a good direction  at this time. He is going to need a refill of the Levaquin he tells me at this point. 07/04/2019 on evaluation today patient actually appears to be doing well in regard to his wound. He has been tolerating the dressing changes without complication. Fortunately there is no signs of active infection which is good news. No fevers, chills, nausea, vomiting, or diarrhea. The good news also is that I did review his labs and though his sed rate was slightly elevated the C-reactive protein was actually down compared to the last evaluation 2 weeks ago this is excellent news I think it is showing signs of improvement. 07/11/2019 on evaluation today patient actually appears to be doing quite well with regard to his wound on my evaluation. This is very slow going but nonetheless he seems to be making progress. Especially in regard to the depth. Nonetheless I believe that we may want to consider going forward with the PriMatrix which was previously approved for him. We will get a have to get a new approval as we will actually be applying this in the new year so we will likely obtain that approval on January 4. In the meantime and for the time being my suggestion is going to be that we go ahead and continue with the wound VAC and we were going at this point with the current wound care measures. 12/28-Patient seen after HBO session today, wound depth with the tunnel measures 3.2 cm, the wound bed appears healthy, continuing with the VAC and HBO 07/25/2019 on evaluation today patient's wound appears to be doing about the same compared to my last evaluation. Fortunately there is no evidence of significant infection which is good news we will still waiting on the results of his lab work which unfortunately was not done prior to today's visit which is what we were  aiming for. Ultimately there is no sign of active infection at this time which is good news. And he also tells me that the wound VAC has been staying in place and doing fairly well which is also good news. With that being said I am concerned about the fact that the wound has not progressed as much MAUREEN, HAAGEN. ([id number]) I think we may want to look into getting reapproval for the skin substitute, specifically PriMatrix, that I am hopeful will help to allow this area to granulate in more effectively. I would recommend as well continuing with the wound VAC along with this. 08/01/2019 on evaluation today patient appears to be doing in my opinion slightly better in regard to the wound. He actually does have some tightening of the walls around the edges which is a good thing. Fortunately there is no signs of active infection at this time. No fever chills noted. I do believe that the patient in general seems to be making progress although is very slow. Obviously were trying to get things to speed up somewhat more. No fevers, chills, nausea, vomiting, or diarrhea. 08/08/2019 upon evaluation today patient appears to be doing well with regard to his wound although there is not a significant improvement overall we do have the PriMatrix for availability today to apply. Fortunately there is no signs of active infection and the patient overall seems to be doing well. There is really no need for aggressive sharp debridement at this point either which is also good news. I do believe her get a need to suture and the primary checks in the basin in the PriMatrix in order to keep it  in place at the base of the wound underneath the wound VAC. This was discussed with the patient today as well. He is in agreement the plan. He really does not have any filling in the area and so this should not be a complication as far as suturing is concerned. 08/15/2019 on evaluation today patient appears to be doing somewhat  better in my opinion in regard to the overall appearance of his wound. He has been tolerating the dressing changes without complication. Fortunately the skin subappear to still be in place when this was changed on Friday. With that being said it appears that the PriMatrix may have dissolved in the interim between now and then as the sutures are no longer holding anything in place and again I would sutured it into place. Nonetheless I believe that we can go ahead and likely consider going forward that we may need to apply the PriMatrix weekly as opposed every other week as it does seem to have dissolved rather readily and well. Overall the wound bed appears to be doing excellent at this time. 08/22/2019 upon evaluation today the patient does seem to making some progress with regard to his wound. Overall there is some better quality tissue and less of the hyper granular protrusions all of which is good news. With that being said we do have the PriMatrix available for application today and I am in a try to find a better way to suture this in place for him today. Objective Constitutional Well-nourished and well-hydrated in no acute distress. Vitals Time Taken: 8:14 AM, Height: 73 in, Weight: 210 lbs, BMI: 27.7, Temperature: 98.8 F, Pulse: 94 bpm, Respiratory Rate: 16 breaths/min, Blood Pressure: 124/79 mmHg. Respiratory normal breathing without difficulty. Psychiatric this patient is able to make decisions and demonstrates good insight into disease process. Alert and Oriented x 3. pleasant and cooperative. General Notes: Patient's wound currently showed signs again of a little bit of hyper granular protrusion specifically in roughly the 12:00 location where I did have to perform some debridement to clear away this protrusion. Subsequently post debridement I did go ahead and proceed with a reapplication of the PriMatrix which was applied down into the wound bed and covered with Mepitel.  Subsequently I did actually end up suturing the Mepitel into place with 4 simple interrupted sutures in order to maintain integrity and keep this in place. The wound VAC should be applied over top of this and again this should not be removed by home health in the interim between now and when I see the patient back. ALLIJAH, MARIS ([id number]) Integumentary (Hair, Skin) Wound #1 status is Open. Original cause of wound was Pressure Injury. The wound is located on the Right Gluteal fold. The wound measures 2.8cm length x 2.6cm width x 2.5cm depth; 5.718cm^2 area and 14.294cm^3 volume. There is muscle and Fat Layer (Subcutaneous Tissue) Exposed exposed. There is no undermining noted, however, there is tunneling at 12:00 with a maximum distance of 3.4cm. There is a large amount of serous drainage noted. The wound margin is epibole. There is large (67-100%) pink, hyper - granulation within the wound bed. There is a small (1-33%) amount of necrotic tissue within the wound bed including Adherent Slough. 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 There was a Selective/Open Wound Non-Viable Tissue Debridement with a total area of 0.5 sq cm performed by [name], Dontrae Morini E., [name]. With the following instrument(s): Scissors to remove Viable and Non-Viable tissue/material. Material removed includes Hyper- granulation after achieving pain control using Lidocaine. No specimens were taken. A time out was conducted at 08:30, prior to the start of the procedure. A Moderate amount of bleeding was controlled with Silver Nitrate. The procedure was tolerated well. Post Debridement  Measurements: 2.8cm length x 2.8cm width x 2.5cm depth; 15.394cm^3 volume. Post debridement Stage noted as Category/Stage IV. Character of Wound/Ulcer Post Debridement is stable. Post procedure Diagnosis Wound #1: Same as Pre-Procedure Pre-procedure diagnosis of Wound #1 is a Pressure Ulcer located on the Right Gluteal fold. A skin graft procedure using a bioengineered skin substitute/cellular or tissue based product was performed by [name], Chanler Mendonca E., [name] with the following instrument(s): Scissors. Primatrix was applied and secured with Sutured. 25 sq cm of product was utilized and 0 sq cm was wasted due to wound size. Post Application, Mepitel was applied. A Time Out was conducted at 08:28, prior to the start of the procedure. Post procedure Diagnosis Wound #1: Same as Pre-Procedure . Plan Wound Cleansing: Wound #1 Right Gluteal fold: ATLEE, JELKS. ([id number]) Clean wound with Normal Saline. - in office Cleanse wound with mild soap and water Skin Barriers/Peri-Wound Care: Skin Prep Primary Wound Dressing: Wound #1 Right Gluteal fold: Saline moistened gauze - in clinic only Other: - contact layer applied over top of Primatrix sutured to secure under wound vac Dressing Change Frequency: Wound #1 Right Gluteal fold: Change Dressing Monday, Wednesday, Friday Follow-up Appointments: Wound #1 Right Gluteal fold: Return Appointment in 1 week. Off-Loading: Wound #1 Right Gluteal fold: Turn and reposition every 2 hours - no pressure on wounded area Home Health: Wound #1 Right Gluteal fold: Continue Home Health Visits - Advanced: remove dressing down to contact layer. Do not remove below the contact layer- Primatrix applied. Home Health Nurse may visit PRN to address patient s wound care needs. FACE TO FACE ENCOUNTER: MEDICARE and MEDICAID PATIENTS: I certify that this patient is under my care and that I had a face-to-face encounter that meets the physician face-to-face  encounter requirements with this patient on this date. The encounter with the patient was in whole or in part for the following MEDICAL CONDITION: (primary reason for [place]) MEDICAL NECESSITY: I certify, that based on my findings, NURSING services are a medically necessary home health service. HOME BOUND STATUS: I certify that my clinical findings support that this patient is homebound (i.e., Due to illness or injury, pt requires aid of supportive devices such as crutches, cane, wheelchairs, walkers, the use of special transportation or the assistance of another person to leave their place of residence. There is a normal inability to leave the home and doing so requires considerable and taxing effort. Other absences are for medical reasons / religious services and are infrequent or of short duration when for other reasons). If current dressing causes regression in wound condition, may D/C ordered dressing product/s and apply Normal Saline Moist Dressing daily until next [facility] / Other MD appointment. [place] of regression in wound condition at [phone]. Please direct any NON-WOUND related issues/requests for orders to patient's Primary Care Physician Negative Pressure Wound Therapy: Wound #1 Right Gluteal fold: Wound VAC settings at 125/130 mmHg continuous pressure. Use BLACK/GREEN foam to wound cavity. Use  WHITE foam to fill any tunnel/s and/or undermining. Change VAC dressing 3 X WEEK. Change canister as indicated when full. Nurse may titrate settings and frequency of dressing changes as clinically indicated. - Primatrix skin substitute applied in clinic. Sutured into wound tunnel at 12:00. Do not remove. Apply wound vac as normal, sponge over contact layer. Home Health Nurse may d/c VAC for s/s of increased infection, significant wound regression, or uncontrolled drainage. Anthony at [phone]. Apply contact layer over base  of wound. - contact layer over sutured Primatrix in tunnel at 12:00. Number of foam/gauze pieces used in the dressing = Advanced Therapies: Wound #1 Right Gluteal fold: Primatrix application in clinic; including contact layer, fixation with steri-strips/sutures, dry gauze and cover dressing. - in clinic. HH will apply wound vac. Medications-please add to medication list.: Wound #1 Right Gluteal fold: P.O. Antibiotics - Continue antibiotics. The following medication(s) was prescribed: Levaquin oral 500 mg tablet 1 1 tablet oral taken daily for 14 days. do not take iron with this medication starting 08/25/2019 QUADRI, PERDOMO ([id number]) 1. My suggestion at this time is good to be that we continue with the PriMatrix applications. We will plan to have one next week for him although depending on how things progress and if it appears to be adhering doing well I may hold off and keep up in the next week like this depends on how things are going. 2. I would recommend as well that the patient continue with the appropriate offloading he needs to try to keep pressure and friction off of the areas much as possible this is still of utmost importance in my opinion. 3. I am hopeful that also the antibiotic course has been beneficial for him I will likely repeat lab work next week in order to see where we stand in that regard. We will see patient back for reevaluation in 1 week here in the clinic. If anything worsens or changes patient will contact our office for additional recommendations. Electronic Signature(s) Signed: 08/25/2019 5:08:43 PM By: Worthy Keeler [name] Previous Signature: 08/23/2019 4:30:15 PM Version By: Gretta Cool BSN, RN, CWS, Kim RN, BSN Previous Signature: 08/24/2019 1:07:50 AM Version By: Worthy Keeler [name] Previous Signature: 08/22/2019 1:12:03 PM Version By: Worthy Keeler [name] Entered By: Worthy Keeler on 08/25/2019 17:08:43 KALLEL, HUBBARD  ([id number]) -------------------------------------------------------------------------------- SuperBill Details Patient Name: [name] Date of Service: 08/22/2019 Medical Record Number: [id number] Patient Account Number: [id number] Date of Birth/Sex: 30-Jun-1951 (68 y.o. 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, sequela I10 Essential (primary) hypertension Facility Procedures CPT4 Code: [id number] Description: [id number] - SKIN SUB GRAFT TRNK/ARM/LEG ICD-10 Diagnosis Description L89.314 Pressure ulcer of right buttock, stage 4 Modifier: Quantity: 1 CPT4 Code: [id number] Description: Q4110- Primatrix, 2 X 2 sq cm Modifier: Quantity: 4 Physician Procedures CPT4 Code: [id number] Description: [id number] - WC PHYS SKIN SUB GRAFT TRNK/ARM/LEG ICD-10 Diagnosis Description L89.314 Pressure ulcer of right buttock, stage 4 Modifier: Quantity: 1 Electronic Signature(s) Signed: 08/23/2019 4:29:31 PM By: Gretta Cool, BSN, RN, CWS, Kim RN, BSN Signed: 08/24/2019 1:07:50 AM By: Worthy Keeler [name] Previous Signature: 08/22/2019 1:12:10 PM Version By: Worthy Keeler [name] Entered By:  Gretta Cool, BSN, RN, [employer], Kim on 08/23/2019 16:29:30

## 2019-08-25 NOTE — Progress Notes (Signed)
JAQUAIN, APPLEGATE ([id number]) Visit Report for 08/22/2019 Arrival Information Details Patient Name: [name], [name] Date of Service: 08/22/2019 8:00 AM Medical Record Number: [id number] Patient Account Number: [id number] Date of Birth/Sex: 1950-11-23 (68 y.o. M) Treating RN: [name] Primary Care Jyasia Markoff: [name] Other Clinician: Referring Shakiyah Cirilo: [name] Treating Chava Dulac/Extender: [name], HOYT Weeks in Treatment: 90 Visit Information History Since Last Visit Added or deleted any medications: No Patient Arrived: Wheel Chair Any new allergies or adverse reactions: No Arrival Time: 08:11 Had a fall or experienced change in No Accompanied By: wife activities of daily living that may affect Transfer Assistance: None risk of falls: Patient Identification Verified: Yes Signs or symptoms of abuse/neglect since last visito No Secondary Verification Process Completed: Yes Hospitalized since last visit: No Patient Requires Transmission-Based No Implantable device outside of the clinic excluding No Precautions: cellular tissue based products placed in the center Patient Has Alerts: Yes since last visit: Patient Alerts: NOT Has Dressing in Place as Prescribed: Yes Diabetic Pain Present Now: Yes Electronic Signature(s) Signed: 08/22/2019 3:33:21 PM By: Lorine Bears RCP, RRT, CHT Entered By: Lorine Bears on 08/22/2019 08:13:31 TRIUMPH, BECHERER ([id number]) -------------------------------------------------------------------------------- Encounter Discharge Information Details Patient Name: [name] Date of Service: 08/22/2019 8:00 AM Medical Record Number: [id number] Patient Account Number: [id number] Date of Birth/Sex: 11-Apr-1951 (68 y.o. M) Treating RN: [name] Primary Care Mayah Urquidi: [name] Other Clinician: Referring Teng Decou: [name] Treating Mykai Wendorf/Extender: [name], HOYT Weeks in  Treatment: 43 Encounter Discharge Information Items Post Procedure Vitals Discharge Condition: Stable Temperature (F): 98.8 Ambulatory Status: Wheelchair Pulse (bpm): 94 Discharge Destination: Home Respiratory Rate (breaths/min): 16 Transportation: Private Auto Blood Pressure (mmHg): 124/79 Accompanied By: wife Schedule Follow-up Appointment: Yes Clinical Summary of Care: Electronic Signature(s) Signed: 08/25/2019 5:22:08 PM By: Gretta Cool, BSN, RN, CWS, Kim RN, BSN Entered By: Gretta Cool, BSN, RN, CWS, Kim on 08/22/2019 08:58:10 OTHONIEL, WILCOXEN ([id number]) -------------------------------------------------------------------------------- Lower Extremity Assessment Details Patient Name: [name] Date of Service: 08/22/2019 8:00 AM Medical Record Number: [id number] Patient Account Number: [id number] Date of Birth/Sex: 06-07-1951 (68 y.o. M) Treating RN: Montey Hora Primary Care Christan Ciccarelli: [name] Other Clinician: Referring Riddhi Grether: [name] Treating Cleopatra Sardo/Extender: [name], HOYT Weeks in Treatment: 90 Electronic Signature(s) Signed: 08/22/2019 4:01:28 PM By: Montey Hora Entered By: Montey Hora on 08/22/2019 08:18:16 VANDON, BUCHMANN ([id number]) -------------------------------------------------------------------------------- Multi Wound Chart Details Patient Name: [name] Date of Service: 08/22/2019 8:00 AM Medical Record Number: [id number] Patient Account Number: [id number] Date of Birth/Sex: 11/10/1950 (68 y.o. M) Treating RN: [name] Primary Care Lanaysia Fritchman: [name] Other Clinician: Referring Jahrell Hamor: [name] Treating Tonny Isensee/Extender: [name], HOYT Weeks in Treatment: 90 Vital Signs Height(in): 73 Pulse(bpm): 94 Weight(lbs): 210 Blood Pressure(mmHg): 124/79 Body Mass Index(BMI): 28 Temperature(F): 98.8 Respiratory Rate 16 (breaths/min): Photos: [N/A:N/A] Wound Location: Right Gluteal fold N/A  N/A Wounding Event: Pressure Injury N/A N/A Primary Etiology: Pressure Ulcer N/A N/A Comorbid History: Cataracts, Hypertension, N/A N/A Paraplegia Date Acquired: 11/02/2017 N/A N/A Weeks of Treatment: 90 N/A N/A Wound Status: Open N/A N/A Measurements L x W x D 2.8x2.6x2.5 N/A N/A (cm) Area (cm) : 5.718 N/A N/A Volume (cm) : 14.294 N/A N/A % Reduction in Area: 43.10% N/A N/A % Reduction in Volume: -1322.30% N/A N/A Position 1 (o'clock): 12 Maximum Distance 1 (cm): 3.4 Tunneling: Yes N/A N/A Classification: Category/Stage IV N/A N/A Exudate Amount: Large N/A N/A Exudate Type: Serous N/A N/A Exudate Color: amber N/A N/A Wound Margin: Epibole N/A N/A  Granulation Amount: Large (67-100%) N/A N/A Granulation Quality: Pink, Hyper-granulation N/A N/A Necrotic Amount: Small (1-33%) N/A N/A Exposed Structures: Fat Layer (Subcutaneous N/A N/A Tissue) Exposed: Yes Muscle: Yes Fascia: No SHERIFF, AMESQUITA ([id number]) Tendon: No Joint: No Bone: No Epithelialization: Small (1-33%) N/A N/A Treatment Notes Electronic Signature(s) Signed: 08/25/2019 5:22:08 PM By: Gretta Cool, BSN, RN, CWS, Kim RN, BSN Entered By: Gretta Cool, BSN, RN, CWS, Kim on 08/22/2019 [id number] TELVIS, GORSUCH ([id number]) -------------------------------------------------------------------------------- [place] Details Patient Name: [name] Date of Service: 08/22/2019 8:00 AM Medical Record Number: [id number] Patient Account Number: [id number] Date of Birth/Sex: June 20, 1951 (68 y.o. M) Treating RN: [name] Primary Care Laksh Hinners: [name] Other Clinician: Referring Tnya Ades: [name] Treating Maddison Kilner/Extender: [name], HOYT Weeks in Treatment: 10 Active Inactive Orientation to the Wound Care Program Nursing Diagnoses: Knowledge deficit related to the wound healing center program Goals: Patient/caregiver will verbalize understanding of the [name]  Program Date Initiated: 11/30/2017 Target Resolution Date: 12/21/2017 Goal Status: Active Interventions: Provide education on orientation to the wound center Notes: Pressure Nursing Diagnoses: Knowledge deficit related to management of pressures ulcers Goals: Patient/caregiver will verbalize understanding of pressure ulcer management Date Initiated: 05/17/2018 Target Resolution Date: 05/28/2018 Goal Status: Active Interventions: Assess: immobility, friction, shearing, incontinence upon admission and as needed Notes: Wound/Skin Impairment Nursing Diagnoses: Impaired tissue integrity Goals: Patient/caregiver will verbalize understanding of skin care regimen Date Initiated: 11/30/2017 Target Resolution Date: 12/21/2017 Goal Status: Active Ulcer/skin breakdown will have a volume reduction of 30% by week 4 Date Initiated: 11/30/2017 Target Resolution Date: 12/21/2017 Goal Status: Active SEVERIN, HAUKE ([id number]) Interventions: Assess patient/caregiver ability to obtain necessary supplies Assess patient/caregiver ability to perform ulcer/skin care regimen upon admission and as needed Assess ulceration(s) every visit Treatment Activities: Skin care regimen initiated : 11/30/2017 Notes: Electronic Signature(s) Signed: 08/25/2019 5:22:08 PM By: Gretta Cool, BSN, RN, CWS, Kim RN, BSN Entered By: Gretta Cool, BSN, RN, CWS, Kim on 08/22/2019 08:28:32 ERICSSON, SCIORTINO ([id number]) -------------------------------------------------------------------------------- Pain Assessment Details Patient Name: [name] Date of Service: 08/22/2019 8:00 AM Medical Record Number: [id number] Patient Account Number: [id number] Date of Birth/Sex: 02/24/1951 (68 y.o. M) Treating RN: [name] Primary Care Izacc Demeyer: [name] Other Clinician: Referring Lexton Hidalgo: [name] Treating Nyisha Clippard/Extender: [name], HOYT Weeks in Treatment: 90 Active Problems Location of Pain Severity and  Description of Pain Patient Has Paino Yes Site Locations Rate the pain. Current Pain Level: 5 Pain Management and Medication Current Pain Management: Electronic Signature(s) Signed: 08/22/2019 3:33:21 PM By: Lorine Bears RCP, RRT, CHT Signed: 08/25/2019 5:22:08 PM By: Gretta Cool, BSN, RN, CWS, Kim RN, BSN Entered By: Lorine Bears on 08/22/2019 08:13:53 VENCENT, HENSCHEN ([id number]) -------------------------------------------------------------------------------- Patient/Caregiver Education Details Patient Name: [name] Date of Service: 08/22/2019 8:00 AM Medical Record Number: [id number] Patient Account Number: [id number] Date of Birth/Gender: 1951-02-19 (68 y.o. M) Treating RN: [name] Primary Care Physician: [name] Other Clinician: Referring Physician: [name] Treating Physician/Extender: Sharalyn Ink in Treatment: 67 Education Assessment Education Provided To: Patient Education Topics Provided Pressure: Handouts: Other: Keep pressure off of wound at all times. Electronic Signature(s) Signed: 08/25/2019 5:22:08 PM By: Gretta Cool, BSN, RN, CWS, Kim RN, BSN Entered By: Gretta Cool, BSN, RN, CWS, Kim on 08/22/2019 08:54:39 JEREMAINE, REINECK ([id number]) -------------------------------------------------------------------------------- Wound Assessment Details Patient Name: [name] Date of Service: 08/22/2019 8:00 AM Medical Record Number: [id number] Patient Account Number: [id number] Date of Birth/Sex: 1951/07/01 (68 y.o. M) Treating RN: [name] Primary Care Mystique Bjelland:  Hande, Vishwanath Other Clinician: Referring Neveen Daponte: [name] Treating Dariyah Garduno/Extender: [name], HOYT Weeks in Treatment: 90 Wound Status Wound Number: 1 Primary Etiology: Pressure Ulcer Wound Location: Right Gluteal fold Wound Status: Open Wounding Event: Pressure Injury Comorbid History: Cataracts, Hypertension,  Paraplegia Date Acquired: 11/02/2017 Weeks Of Treatment: 90 Clustered Wound: No Photos Wound Measurements Length: (cm) 2.8 % Reductio Width: (cm) 2.6 % Reductio Depth: (cm) 2.5 Epithelial Area: (cm) 5.718 Tunneling Volume: (cm) 14.294 Positi Maximum n in Area: 43.1% n in Volume: -1322.3% ization: Small (1-33%) : Yes on (o'clock): 12 Distance: (cm) 3.4 Undermining: No Wound Description Classification: Category/Stage IV Foul Odor Wound Margin: Epibole Slough/Fib Exudate Amount: Large Exudate Type: Serous Exudate Color: amber After Cleansing: No rino Yes Wound Bed Granulation Amount: Large (67-100%) Exposed Structure Granulation Quality: Pink, Hyper-granulation Fascia Exposed: No Necrotic Amount: Small (1-33%) Fat Layer (Subcutaneous Tissue) Exposed: Yes Necrotic Quality: Adherent Slough Tendon Exposed: No Muscle Exposed: Yes Necrosis of Muscle: No Joint Exposed: No DARIS, MURACH. ([id number]) Bone Exposed: No Treatment Notes Wound #1 (Right Gluteal fold) Notes Primatrix, mepitel one, saline moistened gauze, cover dressing. HH to replace wound vac later today. Electronic Signature(s) Signed: 08/25/2019 5:22:08 PM By: Gretta Cool, BSN, RN, CWS, Kim RN, BSN Entered By: Gretta Cool, BSN, RN, CWS, Kim on 08/22/2019 08:36:02 SCOT, ENSOR ([id number]) -------------------------------------------------------------------------------- Vitals Details Patient Name: [name] Date of Service: 08/22/2019 8:00 AM Medical Record Number: [id number] Patient Account Number: [id number] Date of Birth/Sex: Nov 24, 1950 (68 y.o. M) Treating RN: [name] Primary Care Jahmar Mckelvy: [name] Other Clinician: Referring Adreena Willits: [name] Treating Anush Wiedeman/Extender: [name], HOYT Weeks in Treatment: 90 Vital Signs Time Taken: 08:14 Temperature (F): 98.8 Height (in): 73 Pulse (bpm): 94 Weight (lbs): 210 Respiratory Rate (breaths/min): 16 Body Mass Index (BMI):  27.7 Blood Pressure (mmHg): 124/79 Reference Range: 80 - 120 mg / dl Electronic Signature(s) Signed: 08/22/2019 3:33:21 PM By: Lorine Bears RCP, RRT, CHT Entered By: Lorine Bears on 08/22/2019 08:15:04

## 2019-08-29 ENCOUNTER — Other Ambulatory Visit: Payer: Self-pay

## 2019-08-29 ENCOUNTER — Encounter: Payer: Medicare Other | Admitting: Physician Assistant

## 2019-08-29 DIAGNOSIS — L89314 Pressure ulcer of right buttock, stage 4: Secondary | ICD-10-CM | POA: Diagnosis not present

## 2019-08-30 NOTE — Progress Notes (Signed)
CHUONG, VONBANK ([id number]) Visit Report for 08/29/2019 Chief Complaint Document Details Patient Name: [name], [name] Date of Service: 08/29/2019 8:00 AM Medical Record Number: [id number] Patient Account Number: [id number] Date of Birth/Sex: [birth year] (69 y.o. M) Treating RN: Primary Care Provider: [name] Other Clinician: Referring Provider: [name] Treating Provider/Extender: [name], Chanika Byland Weeks in Treatment: 17 Information Obtained from: Patient Chief Complaint Right gluteal fold ulcer Electronic Signature(s) Signed: 08/29/2019 8:54:13 AM By: Worthy Keeler [name] Entered By: Worthy Keeler on 08/29/2019 08:54:12 [name], [name] ([id number]) -------------------------------------------------------------------------------- Cellular or Tissue Based Product Details Patient Name: [name] Date of Service: 08/29/2019 8:00 AM Medical Record Number: [id number] Patient Account Number: [id number] Date of Birth/Sex: 01-Dec-1950 (69 y.o. M) Treating RN: [name] Primary Care Provider: [name] Other Clinician: Referring Provider: [name] Treating Provider/Extender: [name], Pixie Burgener Weeks in Treatment: 42 Cellular or Tissue Based Wound #1 Right Gluteal fold Product Type Applied to: Performed By: Physician [name], Algis Lehenbauer E., [name] Cellular or Tissue Based Primatrix Product Type: Level of Consciousness (Pre- Awake and Alert procedure): Pre-procedure Verification/Time Yes - 08:43 Out Taken: Location: trunk / arms / legs Wound Size (sq cm): 4.75 Product Size (sq cm): 4 Waste Size (sq cm): 0 Amount of Product Applied (sq cm): 4 Instrument Used: Curette, Scissors Lot #: [id number] Expiration Date: 09/18/2023 Fenestrated: Yes Instrument: Mesher Reconstituted: Yes Solution Type: nacl Solution Amount: 54mL Lot #: [id number] Solution Expiration Date: 01/18/2021 Secured: Yes Secured With: Sutured Dressing Applied: Yes Primary Dressing:  Sorbact Response to Treatment: Procedure was tolerated well Level of Consciousness (Post- Awake and Alert procedure): Post Procedure Diagnosis Same as Pre-procedure Notes 6 sutures in wound bed to secure sorbact Electronic Signature(s) Signed: 08/30/2019 4:33:39 PM By: Gretta Cool, BSN, RN, CWS, Kim RN, BSN Entered By: Gretta Cool, BSN, RN, CWS, Kim on 08/29/2019 08:53:20 [name], [name] ([id number]) -------------------------------------------------------------------------------- Debridement Details Patient Name: [name] Date of Service: 08/29/2019 8:00 AM Medical Record Number: [id number] Patient Account Number: [id number] Date of Birth/Sex: [birth year] (69 y.o. M) Treating RN: [name] Primary Care Provider: [name] Other Clinician: Referring Provider: [name] Treating Provider/Extender: [name], Tiffiney Sparrow Weeks in Treatment: 91 Debridement Performed for Wound #1 Right Gluteal fold Assessment: Performed By: Physician [name], Barbaraann Avans E., [name] Debridement Type: Debridement Level of Consciousness (Pre- Awake and Alert procedure): Pre-procedure Verification/Time Yes - 08:35 Out Taken: Start Time: 08:35 Pain Control: Lidocaine Total Area Debrided (L x W): 2.5 (cm) x 1.9 (cm) = 4.75 (cm) Tissue and other material Viable, Non-Viable, Slough, Subcutaneous, Slough debrided: Level: Skin/Subcutaneous Tissue Debridement Description: Excisional Instrument: Curette Bleeding: Moderate Hemostasis Achieved: Pressure End Time: 08:40 Response to Treatment: Procedure was tolerated well Level of Consciousness Awake and Alert (Post-procedure): Post Debridement Measurements of Total Wound Length: (cm) 2.5 Stage: Category/Stage IV Width: (cm) 1.9 Depth: (cm) 2.5 Volume: (cm) 9.327 Character of Wound/Ulcer Post Stable Debridement: Post Procedure Diagnosis Same as Pre-procedure Electronic Signature(s) Signed: 08/29/2019 5:41:25 PM By: Worthy Keeler  [name] Signed: 08/30/2019 4:33:39 PM By: Gretta Cool, BSN, RN, CWS, Kim RN, BSN Entered By: Gretta Cool, BSN, RN, CWS, Kim on 08/29/2019 08:43:05 [name], [name] ([id number]) -------------------------------------------------------------------------------- HPI Details Patient Name: [name] Date of Service: 08/29/2019 8:00 AM Medical Record Number: [id number] Patient Account Number: [id number] Date of Birth/Sex: 1950/10/30 (69 y.o. 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 He states that there is no history of diabetes that he is aware of. He does have issues with his prostate and is currently receiving treatment for this by way of oral medication. With that being said I do not have a lot of details in that regard. Nonetheless the patient presents today as a result of having been referred to [place] by another provider initially home health was set to come out and take care of his wound although due to the fact that he apparently drives he's not able to receive home health. His wife is therefore trying to help take care of this wound within although they have been struggling with what exactly to do at this point. She states that she can do some things but she is definitely not a nurse and does have some issues with looking at blood. The good news is the wound does not appear to be too deep and is fairly superficial at this point. There is no slough noted there is some nonviable skin noted around the surface of the wound and the perimeter at this point. The central  portion of the wound appears to be very good with a dermal layer noted this does not appear to be again deep enough to extend it to subcutaneous tissue at this point. 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 In the pump he has Dilaudid and baclofen. 12/07/17 on evaluation today patient presents for follow-up concerning his ongoing lower back thigh ulcer on the right. He states that he did not get the supplies ordered and therefore has not really been able to perform the dressing changes as directed exactly. His wife was able to get some Boarder Foam Dressing's from the drugstore and subsequently has been using hydrogel which did help to a degree in the wound does appear to be able smaller. There is actually more drainage this week noted than previous. 12/21/17 on evaluation today patient appears to be doing rather well in regard to his right gluteal ulcer. He has been tolerating the dressing changes without complication. There does not appear to be any evidence of infection at this point in time. Overall the wound does seem to be making some progress as far as the edges are concerned there's not as much in the way of overlapping of the external wound edges and he has a good epithelium to wound bed border for the most part. This however is not true right at the 12 o'clock location over the span of a little over a centimeters which actually will require debridement today to clean this away and hopefully allow it to continue to heal more appropriately. 12/28/17 on evaluation today patient appears to be doing rather well in regard to his ulcer in the left gluteal region. He's been tolerating the dressing changes without complication. Apparently he has had some difficulty getting his dressing material. Apparently there's been some confusion with ordering we're gonna check into this. Nonetheless overall  he's been showing signs of improvement which is good news. Debridement is not required today. 01/04/18 on evaluation today patient presents for follow-up concerning his right gluteal ulcer. He has been tolerating the dressing  changes fairly well. On inspection today it appears he may actually have some maceration them concerned about the fact that he may be developing too much moisture in and around the wound bed which can cause delay in healing. With that being said he unfortunately really has not showed significant signs of improvement since last week's evaluation in fact this may even be just the little bit/slightly larger. Nonetheless he's been having a lot of discomfort I'm not sure this is even related to the wound as he has no pain when I'm to breeding or otherwise cleaning the wound during evaluation today. Nonetheless this is something that we did recommend he talked to his pain specialist concerning. 01/11/18 on evaluation today patient appears to be doing better in regard to his ulceration. He has been tolerating the dressing changes without complication. With that being said overall there's no evidence of infection which is good news. The only thing is he did receive the hatch affair blue classic versus the ready nonetheless I feel like this is perfectly fine and appears to have done well for him over the past week. 01/25/18 on evaluation today patient's wound actually appears to be a little bit larger than during the last evaluation. The good [name], PECOR. ([id number]) news is the majority of the wound edges actually appear to be fairly firmly attached to the wound bed unfortunately again we're not really making progress in regard to the size. Roughly the wound is about the same size as when I first saw him although again the wound margin/edges appear to be much better. 02/01/18 on evaluation today patient actually appears to be doing very well in regard to his wound. Applying the  Prisma dry does seem to be better although he does still have issues with slow progression of the wound. There was a slight improvement compared to last week's measurements today. Nonetheless I have been considering other options as far as the possibility of Theraskin or even a snap vac. In general I'm not sure that the Theraskin due to location of the wound would be a very good idea. Nonetheless I do think that a snap vac could be a possibility for the patient and in fact I think this could even be an excellent way to manage the wound possibly seeing some improvement in a very rapid fashion here. Nonetheless this is something that we would need to get approved and I did have a lengthy conversation with the patient about this today. 02/08/18 on evaluation today patient appears to be doing a little better in regard to his ulcer. He has been tolerating the dressing changes without complication. Fortunately despite the fact that the wound is a little bit smaller it's not significantly so unfortunately. We have discussed the possibility of a snap vac we did check with insurance this is actually covered at this point. Fortunately there does not appear to be any sign of infection. Overall I'm fairly pleased with how things seem to be appearing at this point. 02/15/18 on evaluation today patient appears to be doing rather well in regard to his right gluteal ulcer. Unfortunately the snap vac did not stay in place with his sheer and friction this came loose and did not seem to maintain seal very well. He worked for about two days and it did seem to do very well during that time according to his wife but in general this does not seem to be something that's gonna be beneficial for him long-term. I do believe we  need to go back to standard dressings to see if we can find something that will be of benefit. 03/02/18- He is here in follow up evaluation; there is minimal change in the wound. He will continue with the  same treatment plan, would consider changing to iodosrob/iodoflex if ulcer continues to to plateau. He will follow up next week 03/08/18 on evaluation today patient's wound actually appears to be about the same size as when I previously saw him several weeks back. Unfortunately he does have some slightly dark discoloration in the central portion of the wound which has me concerned about pressure injury. I do believe he may be sitting for too long a period of time in fact he tells me that "I probably sit for much too long". He does have some Slough noted on the surface of the wound and again as far as the size of the wound is concerned I'm really not seeing anything that seems to have improved significantly. 03/15/18 on evaluation today patient appears to be doing fairly well in regard to his ulcer. The wound measured pretty much about the same today compared to last week's evaluation when looking at his graph. With that being said the area of bruising/deep tissue injury that was noted last week I do not see at this point. He did get a new cushion fortunately this does seem to be have been of benefit in my pinion. It does appear that he's been off of this more which is good news as well I think that is definitely showing in the overall wound measurements. With that being said I do believe that he needs to continue to offload I don't think that the fact this is doing better should be or is going to allow him to not have to offload and explain this to him as well. Overall he seems to be in agreement the plan I think he understands. The overall appearance of the wound bed is improved compared to last week I think the Iodoflex has been beneficial in that regard. 03/29/18 on evaluation today patient actually appears to be doing rather well in regard to his wound from the overall appearance standpoint he does have some granulation although there's some Slough on the surface of the wound noted as well. With  that being said he unfortunately has not improved in regard to the overall measurement of the wound in volume or in size. I did have a discussion with him very specifically about offloading today. He actually does work although he mainly is just sitting throughout the day. He tells me he offloads by "lifting himself up for 30 seconds off of his chair occasionally" purchase from [employer] which does seem to have helped. And he has a new cushion that he with that being said he's also able to stand some for a very short period of time but not significant enough I think to provide appropriate offloading. I think the biggest issue at this point with the wound and the fact is not healing as quickly as we would like is due to the fact that he is really not able to appropriately offload while at work. He states the beginning after his injury he actually had a bed at his job that he could lay on in order to offload and that does seem to have been of help back at that time. Nonetheless he had not done this in quite some time unfortunately. I think that could be helpful for him this is something  I would like for him to look into. 04/05/18 on evaluation today patient actually presents for follow-up concerning his right gluteal ulcer. Again he really is not significantly improved even compared to last week. He has been tolerating the dressing changes without complication. With that being said fortunately there appears to be no evidence of infection at this time. He has been more proactive in trying to offload. [name], [name] ([id number]) 04/12/18 on evaluation today patient actually appears to be doing a little better in regard to his wound and the right gluteal fold region. He's been tolerating the dressing changes since removing the oasis without complication. However he was having a lot of burning initially with the oasis in place. He's unsure of exactly why this was given so much discomfort but  he assumes that it was the oasis itself causing the problem. Nonetheless this had to be removed after about three days in place although even those three days seem to have made a fairly good improvement in regard to the overall appearance of the wound bed. In fact is the first time that he's made any improvement from the standpoint of measurements in about six weeks. He continues to have no discomfort over the area of the wound itself which leads me to wonder why he was having the burning with the oasis when he does not even feel the actual debridement's themselves. I am somewhat perplexed by this. 04/19/18 on evaluation today patient's wound actually appears to be showing signs of epithelialization around the edge of the wound and in general actually appears to be doing better which is good news. He did have the same burning after about three days with applying the Endoform last week in the same fashion that I would generally apply a skin substitute. This seems to indicate that it's not the oasis to cause the problem but potentially the moisture buildup that just causes things to burn or there may be some other reaction with the skin prep or Steri-Strips. Nonetheless I'm not sure that is gonna be able to tolerate any skin substitute for a long period of time. The good news is the wound actually appears to be doing better today compared to last week and does seem to finally be making some progress. 04/26/18 on evaluation today patient actually appears to be doing rather well in regard to his ulcer in the right gluteal fold. He has been tolerating the dressing changes without complication which is good news. The Endoform does seem to be helping although he was a little bit more macerated this week. This seems to be an ongoing issue with fluid control at this point. Nonetheless I think we may be able to add something like Drawtex to help control the drainage. 05/03/18 on evaluation today patient appears  to actually be doing better in regard to the overall appearance of his wound. He has been tolerating the dressing changes without complication. Fortunately there appears to be no evidence of infection at this time. I really feel like his wound has shown signs as of today of turning around last week I thought so as well and definitely he could be seen in this week's overall appearance and measurements. In general I'm very pleased with the fact that he finally seems to be making a steady but sure progress. The patient likewise is very pleased. 05/17/18 on evaluation today patient appears to be doing more poorly unfortunately in regard to his ulcer. He has been tolerating the dressing changes without complication. With  that being said he tells me that in the past couple of days he and his wife have noticed that we did not seem to be doing quite as well is getting dark near the center. Subsequently upon evaluation today the wound actually does appear to be doing worse compared to previous. He has been tolerating the dressing changes otherwise and he states that he is not been sitting up anymore than he was in the past from what he tells me. Still he has continued to work he states "I'm tired of dealing with this and if I have to just go home and lay in the bed all the time that's what I'll do". Nonetheless I am concerned about the fact that this wound does appear to be deeper than what it was previous. 05/24/18 upon evaluation today patient actually presents after having been in the hospital due to what was presumed to be sepsis secondary to the wound infection. He had an elevated white blood cell count between 14 and 15. With that being said he does seem to be doing somewhat better now. His wound still is giving him some trouble nonetheless and he is obviously concerned about the fact likely talked about that this does seem to go more deeply than previously noted. I did review his wound culture which  showed evidence of Staphylococcus aureus him and group B strep. Nonetheless he is on antibiotics, Levaquin, for this. Subsequently I did review his intake summary from the hospital as well. I also did look at the CT of the lumbar spine with contrast that was performed which showed no bone destruction to suggest lumbar disguises/osteomyelitis or sacral osteomyelitis. There was no paraspinal abscess. Nonetheless it appears this may have been more of just a soft tissue infection at this point which is good news. He still is nonetheless concerned about the wound which again I think is completely reasonable considering everything he's been through recently. 05/31/18 on evaluation today on evaluation today patient actually appears to be showing signs of his wound be a little bit deeper than what I would like to see. Fortunately he does not show any signs of significant infection although his temperature was 99 today he states he's been checking this at home and has not been elevated. Nonetheless with the undermining that I'm seeing at this point I am becoming more concerned about the wound I do think that offloading is a key factor here that is preventing the speedy recovery at this point. There does not appear to be any evidence of again over infection noted. He's been using Santyl currently. 06/07/18 the patient presents today for follow-up evaluation regarding the left ulcer in the gluteal region. He has been tolerating the Wound VAC fairly well. He is obviously very frustrated with this he states that to mean is really getting in his way. There does not appear to be any evidence of infection at this time he does have a little bit of odor I do not necessarily associate this with infection just something that we sometimes notice with Wound VAC therapy. With that being said I can definitely catch a tone of discontentment overall in the patient's demeanor today. This when he was previously in the  hospital [name], [name]. ([id number]) an CT scan was done of the lumbar region which did not reveal any signs of osteomyelitis. With that being said the pelvis in particular was not evaluated distinctly which means he could still have some osteonecrosis I. Nonetheless the Wound VAC was  started on Thursday I do want to get this little bit more time before jumping to a CT scan of the pelvis although that is something that I might would recommend if were not see an improvement by that time. 06/14/18 on evaluation today patient actually appears to be doing about the same in regard to his right gluteal ulcer. Again he did have a CT scan of the lumbar spine unfortunately this did not include the pelvis. Nonetheless with the depth of the wound that I'm seeing today even despite the fact that I'm not seeing any evidence of overt cellulitis I believe there's a good chance that we may be dealing with osteomyelitis somewhere in the right Ischial region. No fevers, chills, nausea, or vomiting noted at this time. 06/21/18 on evaluation today patient actually appears to be doing about the same with regard to his wound. The tunnel at 6 o'clock really does not appear to be any deeper although it is a little bit wider. I think at this point you may want to start packing this with white phone. Unfortunately I have not got approval for the CT scan of the pelvis as of yet due to the fact that Medicare apparently has been denied it due to the diagnosis codes not being appropriate according to Medicare for the test requested. With that being said the patient cannot have an MRI and therefore this is the only option that we have as far as testing is concerned. The patient has had infection and was on antibiotics and been added code for cellulitis of the bottom to see if this will be appropriate for getting the test approved. Nonetheless I'm concerned about the infection have been spread deeper into the Ischial  region. 06/28/18 on evaluation today patient actually appears to be doing rather well all things considered in regard to the right gluteal ulcer. He has been tolerating the dressing changes without complication. With that being said the Wound VAC he states does have to be replaced almost every day or at least reinforced unfortunately. Patient actually has his CT scan later this morning we should have the results by tomorrow. 07/05/18 on evaluation today patient presents for follow-up concerning his right Ischial ulcer. He did see the surgeon Dr. Lysle Pearl last week. They were actually very happy with him and felt like he spent a tremendous amount of time with them as far as discussing his situation was concerned. In the end Dr. Lysle Pearl did contact me as well and determine that he would not recommend any surgical intervention at this point as he felt like it would not be in the patient's best interest based on what he was seeing. He recommended a referral to infectious disease. Subsequently this is something that Dr. Ines Bloomer office is working on setting up for the patient. As far as evaluation today is concerned the patient's wound actually appears to be worse at this point. I am concerned about how things are progressing and specifically about infection. I do not feel like it's the deeper but the area of depth is definitely widening which does have me concerned. No fevers, chills, nausea, or vomiting noted at this time. I think that we do need initiate antibiotic therapy the patient has an allow allergy to amoxicillin/penicillin he states that he gets a rash since childhood. Nonetheless she's never had the issues with Catholics or cephalosporins in general but he is aware of. 07/27/18 on evaluation today patient presents following admission to the hospital on 07/09/18. He was subsequently  discharged on 07/20/18. On 07/15/18 the patient underwent irrigation and debridement was soft tissue biopsy and bone  biopsy as well as placement of a Wound VAC in the OR by Dr. Celine Ahr. During the hospital course the patient was placed on a Wound VAC and recommended follow up with surgery in three weeks actually with Dr. Delaine Lame who is infectious disease. The patient was on vancomycin during the hospital course. 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 He also had a bone culture which revealed evidence of methicillin-resistant staph aureus. He is updated CT scan 07/09/18 reveals that he had progression of the which was performed on wound to breakdown down to the trochanter where he actually had irregularities there as well suggestive of osteomyelitis. This was a change just since 9 December when we last performed a CT scan. Obviously this one had gone downhill quite significantly and rapidly. At this point upon evaluation I feel like in general the patient's wound seems to be doing fairly well all things considered upon my evaluation today. Obviously this is larger and deeper than what I previously evaluated but at the same time he seems to be making some progress as far as the appearance of the granulation tissue is concerned. I'm happy in that regard. No fevers, chills, nausea, or vomiting noted at this time. He is on IV vancomycin and Rocephin at the facility. He is currently in [facility]. 08/03/18 upon evaluation today patient's wound appears to be doing better in regard to the overall appearance at this point in time. Fortunately he's been tolerating the Wound VAC without complication and states that the facility has been taking excellent care of the wound site. Overall I see some Slough noted on the surface which I am going to attempt sharp debridement today of but nonetheless other than this I feel like he's making progress. 08/09/18 on evaluation today patient's wound appears to be doing much better compared to even last week's evaluation. Do believe that the  Wound VAC is been of great benefit for him. He has been tolerating the dressing changes that is the Wound [name], [name]. ([id number]) VAC without any complication and he has excellent granulation noted currently. There is no need for sharp debridement at this point. 08/16/18 on evaluation today patient actually appears to be doing very well in regard to the wound in the right gluteal fold region. This is showing signs of progress and again appears to be very healthy which is excellent news. Fortunately there is no sign of active infection by way of odor or drainage at this point. Overall I'm very pleased with how things stand. He seems to be tolerating the Wound VAC without complication. 08/23/18 on evaluation today patient actually appears to be doing better in regard to his wound. He has been tolerating the Wound VAC without complication and in fact it has been collecting a significant amount of drainage which I think is good news especially considering how the wound appears. Fortunately there is no signs of infection at this time definitely nothing appears to be worse which is good news. He has not been started on the Bactrim and Flagyl that was recommended by Dr. Delaine Lame yet. I did actually contact her office this morning in order to check and see were things are that regard their gonna be calling me back. 08/30/18 on evaluation today patient actually appears to show signs of excellent improvement today compared to last evaluation. The undermining is getting much better  the wound seems to be feeling quite nicely and I'm very pleased that the granulation in general. With that being said overall I feel like the patient has made excellent progress which is great news. No fevers, chills, nausea, or vomiting noted at this time. 09/06/18 on evaluation today patient actually appears to be doing rather well in regard to his right gluteal ulcer. This is showing signs of improvement in overall I'm  very pleased with how things seem to be progressing. The patient likewise is please. Overall I see no evidence of infection he is about to complete his oral antibiotic regimen which is the end of the antibiotics for him in just about three days. 09/13/18 on evaluation today patient's right Ischial ulcer appears to be showing signs of continued improvement which is excellent news. He's been tolerating the dressing changes without complication. Fortunately there's no signs of infection and the wound that seems to be doing very well. 09/28/18 on evaluation today patient appears to be doing rather well in regard to his right Ischial ulcer. He's been tolerating the Wound VAC without complication he knows there's much less drainage than there used to be this obviously is not a bad thing in my pinion. There's no evidence of infection despite the fact is but nothing about it now for several weeks. 10/04/18 on evaluation today patient appears to be doing better in regard to his right Ischial wound. He has been tolerating the Wound VAC without complication and I do believe that the silver nitrate last week was beneficial for him. Fortunately overall there's no evidence of active infection at this time which is great news. No fevers, chills, nausea, or vomiting noted at this time. 10/11/18 on evaluation today patient actually appears to be doing rather well in regard to his Ischial ulcer. He's been tolerating the Wound VAC still without complication I feel like this is doing a good job. No fevers, chills, nausea, or vomiting noted at this time. 11/01/18 on evaluation today patient presents after having not been seen in our clinic for several weeks secondary to the fact that he was on evaluation today patient presents after having not been seen in our clinic for several weeks secondary to the fact that he was in a skilled nursing facility which was on lockdown currently due to the covert 19 national  emergency. Subsequently he was discharged from the facility on this past Friday and subsequently made an appointment to come in to see yesterday. Fortunately there's no signs of active infection at this time which is good news and overall he does seem to have made progress since I last saw. Overall I feel like things are progressing quite nicely. The patient is having no pain. 11/08/18 on evaluation today patient appears to be doing okay in regard to his right gluteal ulcer. He has been utilizing a Wound VAC home health this changing this at this point since he's home from the skilled nursing facility. Fortunately there's no signs of obvious active infection at this time. Unfortunately though there's no obvious active infection he is having some maceration and his wife states that when the sheets of the Wound VAC office on Sunday when it broke seal that he ended up having significant issues with some smell as well there concerned about the possibility of infection. Fortunately there's No fevers, chills, nausea, or vomiting noted at this time. 11/15/18 on evaluation today patient actually appears to be doing well in regard to his right gluteal ulcer. He has been  tolerating the dressing changes without complication. Specifically the Wound VAC has been utilized up to this point. Fortunately there's no signs of infection and overall I feel like he has made progress even since last week when I last saw him. I'm actually fairly happy with the overall appearance although he does seem to have somewhat of a hyper granular Ditton, Heidi J. ([id number]) overgrowth in the central portion of the wound which I think may require some sharp debridement to try flatness out possibly utilizing chemical cauterization following. 11/23/18 on evaluation today patient actually appears to be doing very well in regard to his sacral ulcer. He seems to be showing signs of improvement with good granulation. With that being said  he still has the small area of hyper granulation right in the central portion of the wound which I'm gonna likely utilize silver nitrate on today. Subsequently he also keeps having a leak at the 6 o'clock location which is unfortunate we may be able to help out with some suggestions to try to prevent this going forward. Fortunately there's no signs of active infection at this time. 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 He's been tolerating the dressing changes without complication. Fortunately there's no signs of active infection at this time. I've been rather pleased with how things have progressed there still some evidence of pressure getting to the area with some redness right around the immediate wound opening. Nonetheless other than this I'm not seeing any significant complications or issues the wound is somewhat hyper granular. Upon discussing with the patient and his wife today I'm not sure that the wound is being packed to the base with the foam at this point. And if it's not been packed fully that may be part of the reason why is not seen as much improvement as far as the granulation from the base out. Again we do not want pack too tightly but we need some of the firm to get to the base of the wound. I discussed this with patient and his wife today. 12/06/18 on evaluation today patient appears to be doing well in regard to his right gluteal pressure ulcer. He's been tolerating the dressing changes without complication. Fortunately there's no signs of active infection. He still has some hyper granular tissue and I do think it would be appropriate to continue with the chemical cauterization as of today. 12/16/18 on evaluation today patient actually appears to be doing okay in regard to his right gluteal ulcer. He is been tolerating the dressing changes without complication including the Wound  VAC. Overall I feel like nothing seems to be worsening I do feel like that the hyper granulation buds in the central portion of the wound have improved to some degree with the silver nitrate. We will have to see how things continue to progress. 12/20/18 on evaluation today patient actually appears to be doing much worse in my pinion even compared to last week's evaluation. Unfortunately as opposed to showing any signs of improvement the areas of hyper granular tissue in the central portion of the wound seem to be getting worse. Subsequently the wound bed itself also seems to be getting deeper even compared to last week which is both unusual as well as concerning since prior he had been shown signs of improvement. Nonetheless I think that the issue could be that he's actually having some difficulty in issues with a  deeper infection. There's no external signs of infection but nonetheless I am more worried about the internal, osteomyelitis, that could be restarting. He has not been on antibiotics for some time at this point. I think that it may be a good idea to go ahead and started back on an antibiotic therapy while we wait to see what the testing shows. 12/27/18 on evaluation today patient presents for follow-up concerning his left gluteal fold wound. Fortunately he appears to be doing well today. I did review the CT scan which was negative for any signs of osteomyelitis or acute abnormality this is excellent news. Overall I feel like the surface of the wound bed appears to be doing significantly better today compared to previously noted findings. There does not appear any signs of infection nor does he have any pain at this time. 01/03/19 on evaluation today patient actually appears to be doing quite well in regard to his ulcer. Post debridement last week he really did not have too much bleeding which is good news. Fortunately today this seems to be doing some better but we still has some of the hyper  granular tissue noted in the base of the wound which is gonna require sharp debridement today as well. Overall I'm pleased with how things seem to be progressing since we switched away from the Wound VAC I think he is making some progress. 01/10/19 on evaluation today patient appears to be doing better in regard to his right gluteal fold ulcer. He has been tolerating the dressing changes without complication. The debridement to seem to be helping with current away some of the poor hyper granular tissue bugs throughout the region of his gluteal fold wound. He's been tolerating the dressing changes otherwise without complication which is great news. No fevers, chills, nausea, or vomiting noted at this time. 01/17/19 on evaluation today patient actually appears to be doing excellent in regard to his wound. He's been tolerating the dressing changes without complication. Fortunately there is no signs of active infection at this time which is great news. No fevers, chills, nausea, or vomiting noted at this time. 01/24/19 on evaluation today patient actually appears to be doing quite well with regard to his ulcer. He has been tolerating the dressing changes without complication. Fortunately there's no signs of active infection at this time. Overall been very pleased with the progress that he seems to be making currently. 01/31/19-Patient returns at 1 week with apparent similarity in dimensions to the wound, with no signs of infection, he has been [name], [name]. ([id number]) changing dressings twice a day 02/08/19 upon evaluation today patient actually appears to be doing well with regard to his right Ischial ulcer. The wound is not appear to be quite as deep and seems to be making progress which is good news. With that being said I'm still reluctant to go back to the Wound VAC at this point. He's been having to change the dressings twice a day which is a little bit much in my pinion from the wound care  supplies standpoint. I think that possibly attempting to utilize extras orbit may be beneficial this may also help to prevent any additional breakdown secondary to fluid retention in the wound itself. The patient is in agreement with giving this a try. 02/15/19 on evaluation today patient actually appears to be doing decently well with regard to his ulcer in the right to gluteal fold location. He's been tolerating the dressing changes without complication. Fortunately there is no signs  of active infection at this time. He is able to keep the current dressing in place more effectively for a day at a time whereas before he was having a changes to to three times a day. The actions or has been helpful in this regard. Fortunately there's no signs of anything getting worse and I do feel like he showing signs of good improvement with regard to the wound bed status. 02/22/2019 patient appears to be doing very well today with regard to his ulcer in the gluteal fold. Fortunately there is no signs of active infection and he has been tolerating the dressing changes without any complication. Overall extremely pleased with how things seem to be progressing. He has much less of the hyper granular projections within the wound these have slowly been debrided away and he seems to be doing well. The wound bed is more uniform. 03/01/19 on evaluation today patient appears to be doing unfortunately about the same in regard to his gluteal ulcer. He's been tolerating the dressing changes without complication. Fortunately there's no signs of active infection at this time. With that being said he continues to develop these hyper granular projections which I'm unsure of exactly what they are and why they are rising. Nonetheless I explained to the patient that I do believe it would be a good idea for [place] to stand a biopsy sample for pathology to see if that can shed any light on what exactly may be going on here. Fortunately I do  not see any obvious signs of infection. With that being said the patient has had a little bit more drainage this week apparently compared to last week. 03/08/2019 on evaluation today patient actually appears to look somewhat better with regard to the appearance of his wound bed at this time. This is good news. Overall I am very pleased with how things seem to have progressed just in the past week with a switch to the [facility] dressing. I think that has been beneficial for him. With that being said at this time the patient is concerned about his biopsy that I sent off last week unfortunately I do not have that report as of yet. Nonetheless we have called to obtain this and hopefully will hear back from the lab later this morning. 03/15/19 on evaluation today patient's wound actually appears to be doing okay today with regard to the overall appearance of the wound bed. He has been tolerating the dressing changes without complication he still has hyper granular tissue noted but fortunately that seems to be minimal at this point compared to some of what we've seen in the past. Nonetheless I do think that he is still having some issues currently with some of this type of granulation the biopsy and since all showed nothing more than just evidence of granulation tissue. Therefore there really is nothing different to initiate or do at this point. 03/24/19 on evaluation today patient appears to be doing a little better with regard to his ulcer. He's been tolerating the dressing changes without complication. Fortunately there is no signs of active infection at this time. No fevers, chills, nausea, or vomiting noted at this time. I'm overall pleased with how things seem to be progressing. 03/29/2019 on evaluation today patient appears to be doing about the same in regard to his ulcer in the right gluteal fold. Unfortunately he is not seeming to make a lot of progress and the wound is somewhat stalled. There is  no signs of active infection  externally but I am concerned about the possibility of infection continuing in the ischial location which previously he did have infection noted. Again is not able to have an MRI so probably our best option for testing for this would be a triple phase bone scan which will detect subtle changes in the bone more so than plain film x-rays. In the past they really have not been beneficial and in fact the CT scans even have been somewhat questionable at times. Nonetheless there is no signs of systemic infection which is at least good news but again his wound is not healing at all on the predicted schedule. 04/05/19 on evaluation today patient appears to be doing well all things considering with regard to his wound and the right gluteal fold. He actually has his triple bone scan scheduled for sometime in the next couple of days. With that being said I've also been looking to other possibilities of what could be causing hyper granular tissue were looking into the bone scan again for evaluating for the risk or possibility of infection deeper and I'm also gonna go ahead and see about obtaining a deep tissue culture today to send and see if there's any evidence of infection noted on culture. He's in agreement with that plan. 04/12/2019 on evaluation today patient presents for reevaluation here in our clinic concerning ongoing issues with his right gluteal fold ulcer. 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 It was discussed with him at that point that I think it would be appropriate RAYMAR, ALIOTO. ([id number]) for him to proceed with hyperbaric oxygen therapy especially in light of the fact that he is previously been on IV antibiotics at the beginning of the year for close to 3 months followed by several weeks of oral antibiotics that was all prescribed  by infectious disease. He had surgical debridement around Christmas December 2019 due to an abscess and osteomyelitis of the ischial bone. Unfortunately this has not really proceeded as well as we would have liked and again we did a CT scan even a couple months ago as he cannot have an MRI secondary to having issues with both a pain pump as well as a spinal cord stimulator which prevent him from going into an MRI machine. With that being said there were chronic changes noted at the ischial tuberosity which had progressed since December 2019 there was no evidence of fluid collection on that initial CT scan. With that being said that was on January 21, 2019. I am not sure that it was a sensitive enough test as compared to an MRI and then subsequently I ordered a bone scan for him which was actually completed on 03/29/2019 and this revealed that he does indeed have positive osteomyelitis involving the right ischial tuberosity. This is adjacent to the ulcer and I think is the reason that his ulcer is not healing. Subsequently I am in a place him back on oral antibiotics today unfortunately his wound culture showed just mixed gram-negative flora with no specific findings of a predominant organism. Nonetheless being with the location I think a good broad-spectrum antibiotic for gram-negative's is a good choice at this point he is previously taken Levaquin without significant issues and I think that is an appropriate antibiotic for him at this time. 04/19/2019 on evaluation today patient actually appears to be doing fairly well with regard to his wound.  He has been tolerating the dressing changes without complication. Fortunately there is no signs of active infection and he has been taking the oral antibiotics at this time. Subsequently we did make a referral to infectious disease although Dr. Steva Ready wants the patient to be seen by general surgery first in order apparently to see if there is anything from a  surgical standpoint that should be done prior to initiation of IV antibiotic therapy. Again the patient is okay with actually seeing Dr. Celine Ahr whom he has seen before we will make that referral. 04/26/2019 on evaluation today patient actually appears to be doing well with regard to his wound. He has been tolerating the dressing changes without complication. Fortunately there is no signs of active infection at this time. I do believe that the hyperbaric oxygen therapy along with the antibiotics which I prescribed at this point have been doing well for him. With that being said he has not seen Dr. Celine Ahr the surgeon yet in fact they have not been contacted for scheduling an appointment as of yet. Subsequently the patient is not on antibiotics currently by IV Dr. Steva Ready did want him to see Dr. Celine Ahr first which we are working on trying to get scheduled. We again give the information to the patient today for Dr. Celine Ahr and her number as far as contacting their office to see about getting something scheduled. Again were looking for whether or not they recommend any surgical intervention. 05/03/2019 on evaluation today patient appears to be doing about the same with regard to his wound there may be a little bit of filling in in regard to the base of the wound but still he has quite a bit of hyper granular tissue that I am seeing today. I believe that he may may need a more aggressive sharp debridement possibly even by the surgeon or under anesthesia in order to clear away some of the hyper granular material in order to help allow for appropriate granulation to fill in. We have made a referral to Dr. Celine Ahr unfortunately it sounds as if they did not receive the referral we contacted them today they should be get in touch with the patient. Depending on how things show from that standpoint the patient may need to see Dr. Ardyth Gal who is the infectious disease specialist although he really does not  want a PICC line again. No fevers, chills, nausea, vomiting, or diarrhea. He is tolerating hyperbaric oxygen therapy very well. 05/12/2019 on evaluation today patient presents for follow-up visit concerning his right gluteal fold ulcer. He did see Dr. Celine Ahr the surgeon who previously evaluated his wound and she actually felt like he was doing quite well with regard to the wound based on what she was seen. He does seem to be responding to some degree with the oral antibiotics along with hyperbaric oxygen therapy at this point she did not see any evidence or need for further surgical intervention at this point. She recommended deferring additional or ongoing antibiotic therapy to Dr. Steva Ready at her discretion. Fortunately there is no signs of active systemic infection. No fevers, chills, nausea, vomiting, or diarrhea. 10/29; patient I am seeing really for the first time today in terms of his wound. 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 He is being treated with hyperbaric oxygen for underlying osteomyelitis most recently documented by a three-phase bone scan. He has been on Levaquin for roughly a month and he is out of these and asked me  for a refill. Most recent cultures were negative although I do not think these were bone cultures. He has a very irregular surface to this wound which is almost nodular in texture. It undermines superiorly with the same nodular hyper granulated surface. I see that he was referred to general surgery for an operative debridement although the surgeon did not agree with this I think that would have been the proper course of action in this. He has been using Hydrofera Blue. He is supposed to start a wound VAC tomorrow which is actually a reapplication of wound VAC. I think mostly last time they had trouble keeping the seal in place I hope we have better look at this this time. 05/24/2019 on evaluation today I did review patient's note from  Dr. Dellia Nims where he was seen last week when I was out of the office. Subsequently it does appear that Dr. Dellia Nims was in agreement that the patient really needed debridement to clear away some of the nodular tissue in the base of the wound. The good news is he has the wound VAC initiated and some of this tissue is already starting to breakdown under the treatment of the wound VAC again because it is not really viable. MALEKHI, ZODROW ([id number]) Nonetheless I do believe that we can likely perform some debridement today to clear this away and hopefully the continuation of the wound VAC along with hyperbarics and antibiotic therapy will be beneficial in stopping this from reoccurring. If we get better control in the wound bed in a good spot I think we can definitely use the PriMatrix which we actually did get approval for. 05/31/2019 on evaluation today patient appears to be doing actually pretty well at this point with regard to his wound. He has been tolerating the dressing changes which is good news. Fortunately there is no signs of infection. He still has some of the hyper granular nodules noted we have not really cleared all these away and I think we can try to do a little bit more that today. I did do quite a bit last week I am hoping to get down to the base of the wound so though actually be able to heal more appropriately. I think the wound VAC is doing a good job right now with controlling the drainage and overall the patient is happy with that. 06/07/2019 upon evaluation today patient appears to be doing quite well compared to last week with regard to the hyper granulation in the base of the wound. He has been tolerating the dressing changes without complication. Fortunately there is no signs of active infection at this time. With that being said we have not had any recent measures as far as blood work is concerned I think we may want to check a few things including a CBC, sed rate, and  C-reactive protein. Patient is in agreement with that we can try to get that scheduled for him for this Friday. 06/13/2019 on evaluation today patient actually appears to be doing much better at this point based on what I am seeing with regard to his wound. He has much less in the way of hyper granular tissue which is good news and a lot more healing that is taken place since I last saw him as far as the amount of space within the wound itself. With that being said he is nearing the completion of the initial 40 treatments for hyperbaric oxygen therapy I think this is something I would recommend extending  as he does seem to be benefiting at this time. Fortunately there is no evidence of active systemic infection at this point nor even local infection for that matter. 06/20/2019 upon evaluation today patient actually appears to be doing well with regard to his wound. In fact this appears to be doing much better today compared to what it was previous. I been extremely pleased with the progress that he is made. In fact the tunnel is much less deep today than what it was in the past and I am seeing excellent signs of improvement. Overall I am happy with how things are going. I did review his white blood cell count which revealed everything to be okay. The one abnormal finding on his CBC was a hemoglobin of 12.7. Subsequently I did also review his sed rate and C-reactive protein. His sed rate measured in at normal level measuring at 26. The C-reactive protein however was elevated today and an abnormal range indicating inflammation. Still I feel like he is trending in a good direction at this time. He is going to need a refill of the Levaquin he tells me at this point. 07/04/2019 on evaluation today patient actually appears to be doing well in regard to his wound. He has been tolerating the dressing changes without complication. Fortunately there is no signs of active infection which is good news. No  fevers, chills, nausea, vomiting, or diarrhea. The good news also is that I did review his labs and though his sed rate was slightly elevated the C-reactive protein was actually down compared to the last evaluation 2 weeks ago this is excellent news I think it is showing signs of improvement. 07/11/2019 on evaluation today patient actually appears to be doing quite well with regard to his wound on my evaluation. This is very slow going but nonetheless he seems to be making progress. Especially in regard to the depth. Nonetheless I believe that we may want to consider going forward with the PriMatrix which was previously approved for him. We will get a have to get a new approval as we will actually be applying this in the new year so we will likely obtain that approval on January 4. In the meantime and for the time being my suggestion is going to be that we go ahead and continue with the wound VAC and we were going at this point with the current wound care measures. 12/28-Patient seen after HBO session today, wound depth with the tunnel measures 3.2 cm, the wound bed appears healthy, continuing with the VAC and HBO 07/25/2019 on evaluation today patient's wound appears to be doing about the same compared to my last evaluation. Fortunately there is no evidence of significant infection which is good news we will still waiting on the results of his lab work which unfortunately was not done prior to today's visit which is what we were aiming for. Ultimately there is no sign of active infection at this time which is good news. And he also tells me that the wound VAC has been staying in place and doing fairly well which is also good news. With that being said I am concerned about the fact that the wound has not progressed as much I think we may want to look into getting reapproval for the skin substitute, specifically PriMatrix, that I am hopeful will help to allow this area to granulate in more  effectively. I would recommend as well continuing with the wound VAC along with this. 08/01/2019 on evaluation today  patient appears to be doing in my opinion slightly better in regard to the wound. He actually does have some tightening of the walls around the edges which is a good thing. Fortunately there is no signs of active ENRIKE, POLITIS. ([id number]) infection at this time. No fever chills noted. I do believe that the patient in general seems to be making progress although is very slow. Obviously were trying to get things to speed up somewhat more. No fevers, chills, nausea, vomiting, or diarrhea. 08/08/2019 upon evaluation today patient appears to be doing well with regard to his wound although there is not a significant improvement overall we do have the PriMatrix for availability today to apply. Fortunately there is no signs of active infection and the patient overall seems to be doing well. There is really no need for aggressive sharp debridement at this point either which is also good news. I do believe her get a need to suture and the primary checks in the basin in the PriMatrix in order to keep it in place at the base of the wound underneath the wound VAC. This was discussed with the patient today as well. He is in agreement the plan. He really does not have any filling in the area and so this should not be a complication as far as suturing is concerned. 08/15/2019 on evaluation today patient appears to be doing somewhat better in my opinion in regard to the overall appearance of his wound. He has been tolerating the dressing changes without complication. Fortunately the skin subappear to still be in place when this was changed on Friday. With that being said it appears that the PriMatrix may have dissolved in the interim between now and then as the sutures are no longer holding anything in place and again I would sutured it into place. Nonetheless I believe that we can go ahead and  likely consider going forward that we may need to apply the PriMatrix weekly as opposed every other week as it does seem to have dissolved rather readily and well. Overall the wound bed appears to be doing excellent at this time. 08/22/2019 upon evaluation today the patient does seem to making some progress with regard to his wound. Overall there is some better quality tissue and less of the hyper granular protrusions all of which is good news. With that being said we do have the PriMatrix available for application today and I am in a try to find a better way to suture this in place for him today. 08/29/2019 on evaluation today patient appears to be doing very well in regard to his wound. I do see signs of new granulation which seems to be somewhat helpful he did have some need for sharp debridement today to clear some of the hyper granulation away but nonetheless I feel like we are headed in a good direction. Fortunately there is no evidence of any systemic infection although we are going to go ahead and reorder the CBC, C-reactive protein, and sed rate today to see where things stand I did just refill his Levaquin as well. Electronic Signature(s) Signed: 08/29/2019 5:38:37 PM By: Worthy Keeler [name] Entered By: Worthy Keeler on 08/29/2019 17:38:36 MADDYN, DINH ([id number]) -------------------------------------------------------------------------------- Physical Exam Details Patient Name: [name] Date of Service: 08/29/2019 8:00 AM Medical Record Number: [id number] Patient Account Number: [id number] Date of Birth/Sex: 09/02/1950 (69 y.o. M) Treating RN: Primary Care Provider: [name] Other Clinician: Referring Provider: [name] Treating Provider/Extender: [name],  Caidon Foti Weeks in Treatment: 39 Constitutional Well-nourished and well-hydrated in no acute distress. Respiratory normal breathing without difficulty. Psychiatric this patient is able to make  decisions and demonstrates good insight into disease process. Alert and Oriented x 3. pleasant and cooperative. Notes Patient's wound bed currently again did require some mild sharp debridement to clear away some of the necrotic tissue following debridement I then did apply the PriMatrix to the base of the wound following this I did go ahead and utilize Sorbact in order to cut a piece to place and over top of the PriMatrix and then subsequently this was sutured down using 6 simple interrupted 4-0 Vicryl sutures. The patient tolerated the process without having any pain whatsoever. This should allow for the wound VAC to be applied and hopefully this should not tear through and will protect anything from causing trouble with the PriMatrix which needs to stay in place. The home health nurse should not be messing with the Sorbact it should be still there when I see him next week. All this was discussed with the patient and his wife today as well. Electronic Signature(s) Signed: 08/29/2019 5:39:47 PM By: Worthy Keeler [name] Entered By: Worthy Keeler on 08/29/2019 17:39:47 PARISH, PEPITONE ([id number]) -------------------------------------------------------------------------------- Physician Orders Details Patient Name: [name] Date of Service: 08/29/2019 8:00 AM Medical Record Number: [id number] Patient Account Number: [id number] Date of Birth/Sex: Jan 03, 1951 (69 y.o. M) Treating RN: [name] Primary Care Provider: [name] Other Clinician: Referring Provider: [name] Treating Provider/Extender: [name], Fatou Dunnigan Weeks in Treatment: 85 Verbal / Phone Orders: No Diagnosis Coding Wound Cleansing Wound #1 Right Gluteal fold o Clean wound with Normal Saline. - in office o Cleanse wound with mild soap and water Skin Barriers/Peri-Wound Care o Skin Prep Primary Wound Dressing Wound #1 Right Gluteal fold o Saline moistened gauze - in clinic only o  Other: - contact layer applied over top of Primatrix sutured to secure under wound vac Dressing Change Frequency Wound #1 Right Gluteal fold o Change Dressing Monday, Wednesday, Friday Follow-up Appointments Wound #1 Right Gluteal fold o Return Appointment in 1 week. Off-Loading Wound #1 Right Gluteal fold o Turn and reposition every 2 hours - no pressure on wounded area Home Health Wound #1 Right Gluteal fold o Continue Home Health Visits - Advanced: remove dressing down to contact layer. Do not remove below the contact layer-Primatrix applied. o Home Health Nurse may visit PRN to address patientos wound care needs. o FACE TO FACE ENCOUNTER: MEDICARE and MEDICAID PATIENTS: I certify that this patient is under my care and that I had a face-to-face encounter that meets the physician face-to-face encounter requirements with this patient on this date. The encounter with the patient was in whole or in part for the following MEDICAL CONDITION: (primary reason for [place]) MEDICAL NECESSITY: I certify, that based on my findings, NURSING services are a medically necessary home health service. HOME BOUND STATUS: I certify that my clinical findings support that this patient is homebound (i.e., Due to illness or injury, pt requires aid of supportive devices such as crutches, cane, wheelchairs, walkers, the use of special transportation or the assistance of another person to leave their place of residence. There is a normal inability to leave the home and doing so requires considerable and taxing effort. Other absences are for medical reasons / religious services and are infrequent or of short duration when for other reasons). o If current dressing causes regression in wound condition, may D/C  ordered dressing product/s and apply Normal Saline Moist Dressing daily until next [place] / Other MD appointment. [place] of regression in wound condition  at [phone]. RAHN, MILONE ([id number]) o Please direct any NON-WOUND related issues/requests for orders to patient's Primary Care Physician Negative Pressure Wound Therapy Wound #1 Right Gluteal fold o Wound VAC settings at 125/130 mmHg continuous pressure. Use BLACK/GREEN foam to wound cavity. Use WHITE foam to fill any tunnel/s and/or undermining. Change VAC dressing 3 X WEEK. Change canister as indicated when full. Nurse may titrate settings and frequency of dressing changes as clinically indicated. - Primatrix skin substitute applied in clinic. Sutured into wound tunnel at 12:00. Do not remove. Apply wound vac as normal, sponge over contact layer. o Home Health Nurse may d/c VAC for s/s of increased infection, significant wound regression, or uncontrolled drainage. [place] at [phone]. o Apply contact layer over base of wound. - contact layer over sutured Primatrix in tunnel at 12:00. o Number of foam/gauze pieces used in the dressing = Advanced Therapies Wound #1 Right Gluteal fold o Primatrix application in clinic; including contact layer, fixation with steri-strips/sutures, dry gauze and cover dressing. - in clinic. HH will apply wound vac. Medications-please add to medication list. Wound #1 Right Gluteal fold o P.O. Antibiotics - Continue antibiotics. Laboratory o CBC W Auto Differential panel in Blood (HEM-CBC) oooo LOINC Code: [id number] oooo Convenience Name: CBC W Auto Differential Risk manager o Sedmentation Rate and Designer, jewellery) Signed: 08/29/2019 5:41:25 PM By: Worthy Keeler [name] Signed: 08/30/2019 4:33:39 PM By: Gretta Cool, BSN, RN, CWS, Kim RN, BSN Entered By: Gretta Cool, BSN, RN, CWS, Kim on 08/29/2019 08:38:57 MAY, FAYARD ([id number]) -------------------------------------------------------------------------------- Problem List Details Patient Name: [name] Date of  Service: 08/29/2019 8:00 AM Medical Record Number: [id number] Patient Account Number: [id number] Date of Birth/Sex: 06-27-1951 (69 y.o. 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 He states that there is no history of diabetes that he is aware of. He does have issues with his  prostate and is currently receiving treatment for this by way of oral medication. With that being said I do not have a lot of  details in that regard. Nonetheless the patient presents today as a result of having been referred to [place] by another provider initially home health was set to come out and take care of his wound although due to the fact that he apparently drives he's not able to receive home health. His wife is therefore trying to help take care of this wound within although they have been struggling with what exactly to do at this point. She states that she can do some things but she is definitely not a nurse and does have some issues with looking at blood. The good news is the wound does not appear to be too deep and is fairly superficial at this point. There is no slough noted there is some nonviable skin noted around the surface of the wound and the perimeter at this point. The central portion of the wound appears to be very good with a dermal layer noted this does not appear to be again deep enough to extend it to subcutaneous tissue at this point. 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 In the pump he has Dilaudid and baclofen. 12/07/17 on evaluation today patient presents for follow-up concerning his ongoing lower back thigh ulcer on the right. He states that he did not get the supplies ordered and therefore has not really been able to perform the dressing changes as directed exactly. His wife was able to get some Boarder Foam Dressing's from the drugstore and subsequently has been using hydrogel which did help to a degree in the wound does appear to be able smaller. There is actually more drainage this week noted than previous. 12/21/17 on evaluation today patient appears to be doing rather well in regard to his right gluteal ulcer. He has been tolerating the dressing changes  without complication. There does not appear to be any evidence of infection at this point in time. Overall the wound does seem to be making some progress as far as the edges are concerned there's not as much in the way of overlapping of the external wound edges and he has a good epithelium to wound bed border for the most part. This however is not true right at the 12 o'clock location over the span of a little over a centimeters which actually will require debridement today to clean this away and hopefully allow it to continue to heal more appropriately. 12/28/17 on evaluation today patient appears to be doing rather well in regard to his ulcer in the left gluteal region. He's been tolerating the dressing changes without complication. Apparently he has had some difficulty getting his dressing material. Apparently there's been some confusion with ordering we're gonna check into this. Nonetheless overall he's been showing signs of improvement which is good news. Debridement is not required today. 01/04/18 on evaluation today patient presents for follow-up concerning his right gluteal ulcer. He has been tolerating the dressing changes fairly well. On inspection today it appears he may actually have some maceration them concerned about the fact that he may be developing too much moisture in and around the wound bed which can cause delay in healing. With that being said he unfortunately really has not showed significant signs of improvement since last week's evaluation in fact this may even be just the little bit/slightly larger. Nonetheless he's been having a lot of discomfort I'm not sure this is even related to the wound  as he has no pain when I'm to breeding or otherwise cleaning the wound during evaluation today. Nonetheless this is something that we did recommend he talked to his pain specialist concerning. 01/11/18 on evaluation today patient appears to be doing better in regard to his ulceration. He  has been tolerating the dressing Abdallah, Nathon J. ([id number]) changes without complication. With that being said overall there's no evidence of infection which is good news. The only thing is he did receive the hatch affair blue classic versus the ready nonetheless I feel like this is perfectly fine and appears to have done well for him over the past week. 01/25/18 on evaluation today patient's wound actually appears to be a little bit larger than during the last evaluation. The good news is the majority of the wound edges actually appear to be fairly firmly attached to the wound bed unfortunately again we're not really making progress in regard to the size. Roughly the wound is about the same size as when I first saw him although again the wound margin/edges appear to be much better. 02/01/18 on evaluation today patient actually appears to be doing very well in regard to his wound. Applying the Prisma dry does seem to be better although he does still have issues with slow progression of the wound. There was a slight improvement compared to last week's measurements today. Nonetheless I have been considering other options as far as the possibility of Theraskin or even a snap vac. In general I'm not sure that the Theraskin due to location of the wound would be a very good idea. Nonetheless I do think that a snap vac could be a possibility for the patient and in fact I think this could even be an excellent way to manage the wound possibly seeing some improvement in a very rapid fashion here. Nonetheless this is something that we would need to get approved and I did have a lengthy conversation with the patient about this today. 02/08/18 on evaluation today patient appears to be doing a little better in regard to his ulcer. He has been tolerating the dressing changes without complication. Fortunately despite the fact that the wound is a little bit smaller it's not significantly so unfortunately. We have  discussed the possibility of a snap vac we did check with insurance this is actually covered at this point. Fortunately there does not appear to be any sign of infection. Overall I'm fairly pleased with how things seem to be appearing at this point. 02/15/18 on evaluation today patient appears to be doing rather well in regard to his right gluteal ulcer. Unfortunately the snap vac did not stay in place with his sheer and friction this came loose and did not seem to maintain seal very well. He worked for about two days and it did seem to do very well during that time according to his wife but in general this does not seem to be something that's gonna be beneficial for him long-term. I do believe we need to go back to standard dressings to see if we can find something that will be of benefit. 03/02/18- He is here in follow up evaluation; there is minimal change in the wound. He will continue with the same treatment plan, would consider changing to iodosrob/iodoflex if ulcer continues to to plateau. He will follow up next week 03/08/18 on evaluation today patient's wound actually appears to be about the same size as when I previously saw him several weeks back. Unfortunately he does  have some slightly dark discoloration in the central portion of the wound which has me concerned about pressure injury. I do believe he may be sitting for too long a period of time in fact he tells me that "I probably sit for much too long". He does have some Slough noted on the surface of the wound and again as far as the size of the wound is concerned I'm really not seeing anything that seems to have improved significantly. 03/15/18 on evaluation today patient appears to be doing fairly well in regard to his ulcer. The wound measured pretty much about the same today compared to last week's evaluation when looking at his graph. With that being said the area of bruising/deep tissue injury that was noted last week I do not see  at this point. He did get a new cushion fortunately this does seem to be have been of benefit in my pinion. It does appear that he's been off of this more which is good news as well I think that is definitely showing in the overall wound measurements. With that being said I do believe that he needs to continue to offload I don't think that the fact this is doing better should be or is going to allow him to not have to offload and explain this to him as well. Overall he seems to be in agreement the plan I think he understands. The overall appearance of the wound bed is improved compared to last week I think the Iodoflex has been beneficial in that regard. 03/29/18 on evaluation today patient actually appears to be doing rather well in regard to his wound from the overall appearance standpoint he does have some granulation although there's some Slough on the surface of the wound noted as well. With that being said he unfortunately has not improved in regard to the overall measurement of the wound in volume or in size. I did have a discussion with him very specifically about offloading today. He actually does work although he mainly is just sitting throughout the day. He tells me he offloads by "lifting himself up for 30 seconds off of his chair occasionally" purchase from [employer] which does seem to have helped. And he has a new cushion that he with that being said he's also able to stand some for a very short period of time but not significant enough I think to provide appropriate offloading. I think the biggest issue at this point with the wound and the fact is not healing as quickly as we would like is due to the fact that he is really not able to appropriately offload while at work. He states the beginning after his injury he actually had a bed at his job that he could lay on in order to offload and that does seem to have been of help back at that time. Nonetheless he had not done this in  quite some time unfortunately. I think that could be helpful for him this is something I would like for him to look into. DERRYCK, LONGER ([id number]) 04/05/18 on evaluation today patient actually presents for follow-up concerning his right gluteal ulcer. Again he really is not significantly improved even compared to last week. He has been tolerating the dressing changes without complication. With that being said fortunately there appears to be no evidence of infection at this time. He has been more proactive in trying to offload. 04/12/18 on evaluation today patient actually appears to be doing a little better  in regard to his wound and the right gluteal fold region. He's been tolerating the dressing changes since removing the oasis without complication. However he was having a lot of burning initially with the oasis in place. He's unsure of exactly why this was given so much discomfort but he assumes that it was the oasis itself causing the problem. Nonetheless this had to be removed after about three days in place although even those three days seem to have made a fairly good improvement in regard to the overall appearance of the wound bed. In fact is the first time that he's made any improvement from the standpoint of measurements in about six weeks. He continues to have no discomfort over the area of the wound itself which leads me to wonder why he was having the burning with the oasis when he does not even feel the actual debridement's themselves. I am somewhat perplexed by this. 04/19/18 on evaluation today patient's wound actually appears to be showing signs of epithelialization around the edge of the wound and in general actually appears to be doing better which is good news. He did have the same burning after about three days with applying the Endoform last week in the same fashion that I would generally apply a skin substitute. This seems to indicate that it's not the oasis to cause the  problem but potentially the moisture buildup that just causes things to burn or there may be some other reaction with the skin prep or Steri-Strips. Nonetheless I'm not sure that is gonna be able to tolerate any skin substitute for a long period of time. The good news is the wound actually appears to be doing better today compared to last week and does seem to finally be making some progress. 04/26/18 on evaluation today patient actually appears to be doing rather well in regard to his ulcer in the right gluteal fold. He has been tolerating the dressing changes without complication which is good news. The Endoform does seem to be helping although he was a little bit more macerated this week. This seems to be an ongoing issue with fluid control at this point. Nonetheless I think we may be able to add something like Drawtex to help control the drainage. 05/03/18 on evaluation today patient appears to actually be doing better in regard to the overall appearance of his wound. He has been tolerating the dressing changes without complication. Fortunately there appears to be no evidence of infection at this time. I really feel like his wound has shown signs as of today of turning around last week I thought so as well and definitely he could be seen in this week's overall appearance and measurements. In general I'm very pleased with the fact that he finally seems to be making a steady but sure progress. The patient likewise is very pleased. 05/17/18 on evaluation today patient appears to be doing more poorly unfortunately in regard to his ulcer. He has been tolerating the dressing changes without complication. With that being said he tells me that in the past couple of days he and his wife have noticed that we did not seem to be doing quite as well is getting dark near the center. Subsequently upon evaluation today the wound actually does appear to be doing worse compared to previous. He has been tolerating  the dressing changes otherwise and he states that he is not been sitting up anymore than he was in the past from what he tells me. Still he has  continued to work he states "I'm tired of dealing with this and if I have to just go home and lay in the bed all the time that's what I'll do". Nonetheless I am concerned about the fact that this wound does appear to be deeper than what it was previous. 05/24/18 upon evaluation today patient actually presents after having been in the hospital due to what was presumed to be sepsis secondary to the wound infection. He had an elevated white blood cell count between 14 and 15. With that being said he does seem to be doing somewhat better now. His wound still is giving him some trouble nonetheless and he is obviously concerned about the fact likely talked about that this does seem to go more deeply than previously noted. I did review his wound culture which showed evidence of Staphylococcus aureus him and group B strep. Nonetheless he is on antibiotics, Levaquin, for this. Subsequently I did review his intake summary from the hospital as well. I also did look at the CT of the lumbar spine with contrast that was performed which showed no bone destruction to suggest lumbar disguises/osteomyelitis or sacral osteomyelitis. There was no paraspinal abscess. Nonetheless it appears this may have been more of just a soft tissue infection at this point which is good news. He still is nonetheless concerned about the wound which again I think is completely reasonable considering everything he's been through recently. 05/31/18 on evaluation today on evaluation today patient actually appears to be showing signs of his wound be a little bit deeper than what I would like to see. Fortunately he does not show any signs of significant infection although his temperature was 99 today he states he's been checking this at home and has not been elevated. Nonetheless with the undermining  that I'm seeing at this point I am becoming more concerned about the wound I do think that offloading is a key factor here that is preventing the speedy recovery at this point. There does not appear to be any evidence of again over infection noted. He's been using Santyl currently. RANEN, BALAN ([id number]) 06/07/18 the patient presents today for follow-up evaluation regarding the left ulcer in the gluteal region. He has been tolerating the Wound VAC fairly well. He is obviously very frustrated with this he states that to mean is really getting in his way. There does not appear to be any evidence of infection at this time he does have a little bit of odor I do not necessarily associate this with infection just something that we sometimes notice with Wound VAC therapy. With that being said I can definitely catch a tone of discontentment overall in the patient's demeanor today. This when he was previously in the hospital an CT scan was done of the lumbar region which did not reveal any signs of osteomyelitis. With that being said the pelvis in particular was not evaluated distinctly which means he could still have some osteonecrosis I. Nonetheless the Wound VAC was started on Thursday I do want to get this little bit more time before jumping to a CT scan of the pelvis although that is something that I might would recommend if were not see an improvement by that time. 06/14/18 on evaluation today patient actually appears to be doing about the same in regard to his right gluteal ulcer. Again he did have a CT scan of the lumbar spine unfortunately this did not include the pelvis. Nonetheless with the depth of the wound that  I'm seeing today even despite the fact that I'm not seeing any evidence of overt cellulitis I believe there's a good chance that we may be dealing with osteomyelitis somewhere in the right Ischial region. No fevers, chills, nausea, or vomiting noted at this time. 06/21/18 on  evaluation today patient actually appears to be doing about the same with regard to his wound. The tunnel at 6 o'clock really does not appear to be any deeper although it is a little bit wider. I think at this point you may want to start packing this with white phone. Unfortunately I have not got approval for the CT scan of the pelvis as of yet due to the fact that Medicare apparently has been denied it due to the diagnosis codes not being appropriate according to Medicare for the test requested. With that being said the patient cannot have an MRI and therefore this is the only option that we have as far as testing is concerned. The patient has had infection and was on antibiotics and been added code for cellulitis of the bottom to see if this will be appropriate for getting the test approved. Nonetheless I'm concerned about the infection have been spread deeper into the Ischial region. 06/28/18 on evaluation today patient actually appears to be doing rather well all things considered in regard to the right gluteal ulcer. He has been tolerating the dressing changes without complication. With that being said the Wound VAC he states does have to be replaced almost every day or at least reinforced unfortunately. Patient actually has his CT scan later this morning we should have the results by tomorrow. 07/05/18 on evaluation today patient presents for follow-up concerning his right Ischial ulcer. He did see the surgeon Dr. Lysle Pearl last week. They were actually very happy with him and felt like he spent a tremendous amount of time with them as far as discussing his situation was concerned. In the end Dr. Lysle Pearl did contact me as well and determine that he would not recommend any surgical intervention at this point as he felt like it would not be in the patient's best interest based on what he was seeing. He recommended a referral to infectious disease. Subsequently this is something that Dr. Ines Bloomer office  is working on setting up for the patient. As far as evaluation today is concerned the patient's wound actually appears to be worse at this point. I am concerned about how things are progressing and specifically about infection. I do not feel like it's the deeper but the area of depth is definitely widening which does have me concerned. No fevers, chills, nausea, or vomiting noted at this time. I think that we do need initiate antibiotic therapy the patient has an allow allergy to amoxicillin/penicillin he states that he gets a rash since childhood. Nonetheless she's never had the issues with Catholics or cephalosporins in general but he is aware of. 07/27/18 on evaluation today patient presents following admission to the hospital on 07/09/18. He was subsequently discharged on 07/20/18. On 07/15/18 the patient underwent irrigation and debridement was soft tissue biopsy and bone biopsy as well as placement of a Wound VAC in the OR by Dr. Celine Ahr. During the hospital course the patient was placed on a Wound VAC and recommended follow up with surgery in three weeks actually with Dr. Delaine Lame who is infectious disease. The patient was on vancomycin during the hospital course. 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 He also had  a bone culture which revealed evidence of methicillin-resistant staph aureus. He is updated CT scan 07/09/18 reveals that he had progression of the which was performed on wound to breakdown down to the trochanter where he actually had irregularities there as well suggestive of osteomyelitis. This was a change just since 9 December when we last performed a CT scan. Obviously this one had gone downhill quite significantly and rapidly. At this point upon evaluation I feel like in general the patient's wound seems to be doing fairly well all things considered upon my evaluation today. Obviously this is larger and deeper than what I previously  evaluated but at the same time he seems to be making some progress as far as the appearance of the granulation tissue is concerned. I'm happy in that regard. No fevers, chills, nausea, or vomiting noted at this time. He is on IV vancomycin and Rocephin at the facility. He is currently in [facility]. 08/03/18 upon evaluation today patient's wound appears to be doing better in regard to the overall appearance at this point in time. Fortunately he's been tolerating the Wound VAC without complication and states that the facility has been taking NASHWAN, BARBA. ([id number]) excellent care of the wound site. Overall I see some Slough noted on the surface which I am going to attempt sharp debridement today of but nonetheless other than this I feel like he's making progress. 08/09/18 on evaluation today patient's wound appears to be doing much better compared to even last week's evaluation. Do believe that the Wound VAC is been of great benefit for him. He has been tolerating the dressing changes that is the Wound VAC without any complication and he has excellent granulation noted currently. There is no need for sharp debridement at this point. 08/16/18 on evaluation today patient actually appears to be doing very well in regard to the wound in the right gluteal fold region. This is showing signs of progress and again appears to be very healthy which is excellent news. Fortunately there is no sign of active infection by way of odor or drainage at this point. Overall I'm very pleased with how things stand. He seems to be tolerating the Wound VAC without complication. 08/23/18 on evaluation today patient actually appears to be doing better in regard to his wound. He has been tolerating the Wound VAC without complication and in fact it has been collecting a significant amount of drainage which I think is good news especially considering how the wound appears. Fortunately there is no signs of  infection at this time definitely nothing appears to be worse which is good news. He has not been started on the Bactrim and Flagyl that was recommended by Dr. Delaine Lame yet. I did actually contact her office this morning in order to check and see were things are that regard their gonna be calling me back. 08/30/18 on evaluation today patient actually appears to show signs of excellent improvement today compared to last evaluation. The undermining is getting much better the wound seems to be feeling quite nicely and I'm very pleased that the granulation in general. With that being said overall I feel like the patient has made excellent progress which is great news. No fevers, chills, nausea, or vomiting noted at this time. 09/06/18 on evaluation today patient actually appears to be doing rather well in regard to his right gluteal ulcer. This is showing signs of improvement in overall I'm very pleased with how things seem to be progressing. The patient likewise  is please. Overall I see no evidence of infection he is about to complete his oral antibiotic regimen which is the end of the antibiotics for him in just about three days. 09/13/18 on evaluation today patient's right Ischial ulcer appears to be showing signs of continued improvement which is excellent news. He's been tolerating the dressing changes without complication. Fortunately there's no signs of infection and the wound that seems to be doing very well. 09/28/18 on evaluation today patient appears to be doing rather well in regard to his right Ischial ulcer. He's been tolerating the Wound VAC without complication he knows there's much less drainage than there used to be this obviously is not a bad thing in my pinion. There's no evidence of infection despite the fact is but nothing about it now for several weeks. 10/04/18 on evaluation today patient appears to be doing better in regard to his right Ischial wound. He has been tolerating  the Wound VAC without complication and I do believe that the silver nitrate last week was beneficial for him. Fortunately overall there's no evidence of active infection at this time which is great news. No fevers, chills, nausea, or vomiting noted at this time. 10/11/18 on evaluation today patient actually appears to be doing rather well in regard to his Ischial ulcer. He's been tolerating the Wound VAC still without complication I feel like this is doing a good job. No fevers, chills, nausea, or vomiting noted at this time. 11/01/18 on evaluation today patient presents after having not been seen in our clinic for several weeks secondary to the fact that he was on evaluation today patient presents after having not been seen in our clinic for several weeks secondary to the fact that he was in a skilled nursing facility which was on lockdown currently due to the covert 19 national emergency. Subsequently he was discharged from the facility on this past Friday and subsequently made an appointment to come in to see yesterday. Fortunately there's no signs of active infection at this time which is good news and overall he does seem to have made progress since I last saw. Overall I feel like things are progressing quite nicely. The patient is having no pain. 11/08/18 on evaluation today patient appears to be doing okay in regard to his right gluteal ulcer. He has been utilizing a Wound VAC home health this changing this at this point since he's home from the skilled nursing facility. Fortunately there's no signs of obvious active infection at this time. Unfortunately though there's no obvious active infection he is having some maceration and his wife states that when the sheets of the Wound VAC office on Sunday when it broke seal that he ended up having significant issues with some smell as well there concerned about the possibility of infection. Fortunately there's No fevers, chills, nausea, or vomiting  noted at this time. CORDEL, WHIPP ([id number]) 11/15/18 on evaluation today patient actually appears to be doing well in regard to his right gluteal ulcer. He has been tolerating the dressing changes without complication. Specifically the Wound VAC has been utilized up to this point. Fortunately there's no signs of infection and overall I feel like he has made progress even since last week when I last saw him. I'm actually fairly happy with the overall appearance although he does seem to have somewhat of a hyper granular overgrowth in the central portion of the wound which I think may require some sharp debridement to try flatness out possibly  utilizing chemical cauterization following. 11/23/18 on evaluation today patient actually appears to be doing very well in regard to his sacral ulcer. He seems to be showing signs of improvement with good granulation. With that being said he still has the small area of hyper granulation right in the central portion of the wound which I'm gonna likely utilize silver nitrate on today. Subsequently he also keeps having a leak at the 6 o'clock location which is unfortunate we may be able to help out with some suggestions to try to prevent this going forward. Fortunately there's no signs of active infection at this time. 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 He's been tolerating the dressing changes without complication. Fortunately there's no signs of active infection at this time. I've been rather pleased with how things have progressed there still some evidence of pressure getting to the area with some redness right around the immediate wound opening. Nonetheless other than this I'm not seeing any significant complications or issues the wound is somewhat hyper granular. Upon discussing with the patient and his wife today I'm not sure that the wound is  being packed to the base with the foam at this point. And if it's not been packed fully that may be part of the reason why is not seen as much improvement as far as the granulation from the base out. Again we do not want pack too tightly but we need some of the firm to get to the base of the wound. I discussed this with patient and his wife today. 12/06/18 on evaluation today patient appears to be doing well in regard to his right gluteal pressure ulcer. He's been tolerating the dressing changes without complication. Fortunately there's no signs of active infection. He still has some hyper granular tissue and I do think it would be appropriate to continue with the chemical cauterization as of today. 12/16/18 on evaluation today patient actually appears to be doing okay in regard to his right gluteal ulcer. He is been tolerating the dressing changes without complication including the Wound VAC. Overall I feel like nothing seems to be worsening I do feel like that the hyper granulation buds in the central portion of the wound have improved to some degree with the silver nitrate. We will have to see how things continue to progress. 12/20/18 on evaluation today patient actually appears to be doing much worse in my pinion even compared to last week's evaluation. Unfortunately as opposed to showing any signs of improvement the areas of hyper granular tissue in the central portion of the wound seem to be getting worse. Subsequently the wound bed itself also seems to be getting deeper even compared to last week which is both unusual as well as concerning since prior he had been shown signs of improvement. Nonetheless I think that the issue could be that he's actually having some difficulty in issues with a deeper infection. There's no external signs of infection but nonetheless I am more worried about the internal, osteomyelitis, that could be restarting. He has not been on antibiotics for some time at this  point. I think that it may be a good idea to go ahead and started back on an antibiotic therapy while we wait to see what the testing shows. 12/27/18 on evaluation today patient presents for follow-up concerning his left gluteal fold wound. Fortunately he appears to be doing well today. I  did review the CT scan which was negative for any signs of osteomyelitis or acute abnormality this is excellent news. Overall I feel like the surface of the wound bed appears to be doing significantly better today compared to previously noted findings. There does not appear any signs of infection nor does he have any pain at this time. 01/03/19 on evaluation today patient actually appears to be doing quite well in regard to his ulcer. Post debridement last week he really did not have too much bleeding which is good news. Fortunately today this seems to be doing some better but we still has some of the hyper granular tissue noted in the base of the wound which is gonna require sharp debridement today as well. Overall I'm pleased with how things seem to be progressing since we switched away from the Wound VAC I think he is making some progress. 01/10/19 on evaluation today patient appears to be doing better in regard to his right gluteal fold ulcer. He has been tolerating the dressing changes without complication. The debridement to seem to be helping with current away some of the poor hyper granular tissue bugs throughout the region of his gluteal fold wound. He's been tolerating the dressing changes otherwise without complication which is great news. No fevers, chills, nausea, or vomiting noted at this time. 01/17/19 on evaluation today patient actually appears to be doing excellent in regard to his wound. He's been tolerating the dressing changes without complication. Fortunately there is no signs of active infection at this time which is great news. No fevers, chills, nausea, or vomiting noted at this time. AEDYN, PENZA ([id number]) 01/24/19 on evaluation today patient actually appears to be doing quite well with regard to his ulcer. He has been tolerating the dressing changes without complication. Fortunately there's no signs of active infection at this time. Overall been very pleased with the progress that he seems to be making currently. 01/31/19-Patient returns at 1 week with apparent similarity in dimensions to the wound, with no signs of infection, he has been changing dressings twice a day 02/08/19 upon evaluation today patient actually appears to be doing well with regard to his right Ischial ulcer. The wound is not appear to be quite as deep and seems to be making progress which is good news. With that being said I'm still reluctant to go back to the Wound VAC at this point. He's been having to change the dressings twice a day which is a little bit much in my pinion from the wound care supplies standpoint. I think that possibly attempting to utilize extras orbit may be beneficial this may also help to prevent any additional breakdown secondary to fluid retention in the wound itself. The patient is in agreement with giving this a try. 02/15/19 on evaluation today patient actually appears to be doing decently well with regard to his ulcer in the right to gluteal fold location. He's been tolerating the dressing changes without complication. Fortunately there is no signs of active infection at this time. He is able to keep the current dressing in place more effectively for a day at a time whereas before he was having a changes to to three times a day. The actions or has been helpful in this regard. Fortunately there's no signs of anything getting worse and I do feel like he showing signs of good improvement with regard to the wound bed status. 02/22/2019 patient appears to be doing very well today with regard to his ulcer  in the gluteal fold. Fortunately there is no signs of active infection and he has  been tolerating the dressing changes without any complication. Overall extremely pleased with how things seem to be progressing. He has much less of the hyper granular projections within the wound these have slowly been debrided away and he seems to be doing well. The wound bed is more uniform. 03/01/19 on evaluation today patient appears to be doing unfortunately about the same in regard to his gluteal ulcer. He's been tolerating the dressing changes without complication. Fortunately there's no signs of active infection at this time. With that being said he continues to develop these hyper granular projections which I'm unsure of exactly what they are and why they are rising. Nonetheless I explained to the patient that I do believe it would be a good idea for [place] to stand a biopsy sample for pathology to see if that can shed any light on what exactly may be going on here. Fortunately I do not see any obvious signs of infection. With that being said the patient has had a little bit more drainage this week apparently compared to last week. 03/08/2019 on evaluation today patient actually appears to look somewhat better with regard to the appearance of his wound bed at this time. This is good news. Overall I am very pleased with how things seem to have progressed just in the past week with a switch to the [facility] dressing. I think that has been beneficial for him. With that being said at this time the patient is concerned about his biopsy that I sent off last week unfortunately I do not have that report as of yet. Nonetheless we have called to obtain this and hopefully will hear back from the lab later this morning. 03/15/19 on evaluation today patient's wound actually appears to be doing okay today with regard to the overall appearance of the wound bed. He has been tolerating the dressing changes without complication he still has hyper granular tissue noted but fortunately that seems to be minimal  at this point compared to some of what we've seen in the past. Nonetheless I do think that he is still having some issues currently with some of this type of granulation the biopsy and since all showed nothing more than just evidence of granulation tissue. Therefore there really is nothing different to initiate or do at this point. 03/24/19 on evaluation today patient appears to be doing a little better with regard to his ulcer. He's been tolerating the dressing changes without complication. Fortunately there is no signs of active infection at this time. No fevers, chills, nausea, or vomiting noted at this time. I'm overall pleased with how things seem to be progressing. 03/29/2019 on evaluation today patient appears to be doing about the same in regard to his ulcer in the right gluteal fold. Unfortunately he is not seeming to make a lot of progress and the wound is somewhat stalled. There is no signs of active infection externally but I am concerned about the possibility of infection continuing in the ischial location which previously he did have infection noted. Again is not able to have an MRI so probably our best option for testing for this would be a triple phase bone scan which will detect subtle changes in the bone more so than plain film x-rays. In the past they really have not been beneficial and in fact the CT scans even have been somewhat questionable at times. Nonetheless there is no  signs of systemic infection which is at least good news but again his wound is not healing at all on the predicted schedule. 04/05/19 on evaluation today patient appears to be doing well all things considering with regard to his wound and the right gluteal fold. He actually has his triple bone scan scheduled for sometime in the next couple of days. With that being said I've also been looking to other possibilities of what could be causing hyper granular tissue were looking into the bone scan again for evaluating  for the risk or possibility of infection deeper and I'm also gonna go ahead and see about obtaining a deep tissue Ardizzone, Layten J. ([id number]) culture today to send and see if there's any evidence of infection noted on culture. He's in agreement with that plan. 04/12/2019 on evaluation today patient presents for reevaluation here in our clinic concerning ongoing issues with his right gluteal fold ulcer. 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 It was discussed with him at that point that I think it would be appropriate for him to proceed with hyperbaric oxygen therapy especially in light of the fact that he is previously been on IV antibiotics at the beginning of the year for close to 3 months followed by several weeks of oral antibiotics that was all prescribed by infectious disease. He had surgical debridement around Christmas December 2019 due to an abscess and osteomyelitis of the ischial bone. Unfortunately this has not really proceeded as well as we would have liked and again we did a CT scan even a couple months ago as he cannot have an MRI secondary to having issues with both a pain pump as well as a spinal cord stimulator which prevent him from going into an MRI machine. With that being said there were chronic changes noted at the ischial tuberosity which had progressed since December 2019 there was no evidence of fluid collection on that initial CT scan. With that being said that was on January 21, 2019. I am not sure that it was a sensitive enough test as compared to an MRI and then subsequently I ordered a bone scan for him which was actually completed on 03/29/2019 and this revealed that he does indeed have positive osteomyelitis involving the right ischial tuberosity. This is adjacent to the ulcer and I think is the reason that his ulcer is not healing. Subsequently I  am in a place him back on oral antibiotics today unfortunately his wound culture showed just mixed gram-negative flora with no specific findings of a predominant organism. Nonetheless being with the location I think a good broad-spectrum antibiotic for gram-negative's is a good choice at this point he is previously taken Levaquin without significant issues and I think that is an appropriate antibiotic for him at this time. 04/19/2019 on evaluation today patient actually appears to be doing fairly well with regard to his wound. He has been tolerating the dressing changes without complication. Fortunately there is no signs of active infection and he has been taking the oral antibiotics at this time. Subsequently we did make a referral to infectious disease although Dr. Steva Ready wants the patient to be seen by general surgery first in order apparently to see if there is anything from a surgical standpoint that should be done prior to initiation of IV antibiotic therapy. Again the patient is okay with actually seeing Dr. Celine Ahr whom  he has seen before we will make that referral. 04/26/2019 on evaluation today patient actually appears to be doing well with regard to his wound. He has been tolerating the dressing changes without complication. Fortunately there is no signs of active infection at this time. I do believe that the hyperbaric oxygen therapy along with the antibiotics which I prescribed at this point have been doing well for him. With that being said he has not seen Dr. Celine Ahr the surgeon yet in fact they have not been contacted for scheduling an appointment as of yet. Subsequently the patient is not on antibiotics currently by IV Dr. Steva Ready did want him to see Dr. Celine Ahr first which we are working on trying to get scheduled. We again give the information to the patient today for Dr. Celine Ahr and her number as far as contacting their office to see about getting something scheduled. Again were  looking for whether or not they recommend any surgical intervention. 05/03/2019 on evaluation today patient appears to be doing about the same with regard to his wound there may be a little bit of filling in in regard to the base of the wound but still he has quite a bit of hyper granular tissue that I am seeing today. I believe that he may may need a more aggressive sharp debridement possibly even by the surgeon or under anesthesia in order to clear away some of the hyper granular material in order to help allow for appropriate granulation to fill in. We have made a referral to Dr. Celine Ahr unfortunately it sounds as if they did not receive the referral we contacted them today they should be get in touch with the patient. Depending on how things show from that standpoint the patient may need to see Dr. Ardyth Gal who is the infectious disease specialist although he really does not want a PICC line again. No fevers, chills, nausea, vomiting, or diarrhea. He is tolerating hyperbaric oxygen therapy very well. 05/12/2019 on evaluation today patient presents for follow-up visit concerning his right gluteal fold ulcer. He did see Dr. Celine Ahr the surgeon who previously evaluated his wound and she actually felt like he was doing quite well with regard to the wound based on what she was seen. He does seem to be responding to some degree with the oral antibiotics along with hyperbaric oxygen therapy at this point she did not see any evidence or need for further surgical intervention at this point. She recommended deferring additional or ongoing antibiotic therapy to Dr. Steva Ready at her discretion. Fortunately there is no signs of active systemic infection. No fevers, chills, nausea, vomiting, or diarrhea. 10/29; patient I am seeing really for the first time today in terms of his wound. 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 He is being treated with hyperbaric oxygen for underlying  osteomyelitis most recently documented by a three-phase bone scan. He has been on Levaquin for roughly a month and he is out of these and asked me for a refill. Most recent cultures were negative although I do not think these were bone cultures. He has a very irregular surface to this wound which is almost nodular in texture. It undermines superiorly with the same nodular hyper granulated surface. I see that he was referred to general surgery for an operative debridement although the surgeon did not agree with this I think that would have been the proper course of action in this. He has been using Hydrofera Blue. He is supposed  to start a wound VAC tomorrow which is actually a reapplication of wound VAC. I think mostly last time they had trouble keeping the seal in place I hope we have better look at this this time. IDREES, ORLANDINI ([id number]) 05/24/2019 on evaluation today I did review patient's note from Dr. Dellia Nims where he was seen last week when I was out of the office. Subsequently it does appear that Dr. Dellia Nims was in agreement that the patient really needed debridement to clear away some of the nodular tissue in the base of the wound. The good news is he has the wound VAC initiated and some of this tissue is already starting to breakdown under the treatment of the wound VAC again because it is not really viable. Nonetheless I do believe that we can likely perform some debridement today to clear this away and hopefully the continuation of the wound VAC along with hyperbarics and antibiotic therapy will be beneficial in stopping this from reoccurring. If we get better control in the wound bed in a good spot I think we can definitely use the PriMatrix which we actually did get approval for. 05/31/2019 on evaluation today patient appears to be doing actually pretty well at this point with regard to his wound. He has been tolerating the dressing changes which is good news. Fortunately there  is no signs of infection. He still has some of the hyper granular nodules noted we have not really cleared all these away and I think we can try to do a little bit more that today. I did do quite a bit last week I am hoping to get down to the base of the wound so though actually be able to heal more appropriately. I think the wound VAC is doing a good job right now with controlling the drainage and overall the patient is happy with that. 06/07/2019 upon evaluation today patient appears to be doing quite well compared to last week with regard to the hyper granulation in the base of the wound. He has been tolerating the dressing changes without complication. Fortunately there is no signs of active infection at this time. With that being said we have not had any recent measures as far as blood work is concerned I think we may want to check a few things including a CBC, sed rate, and C-reactive protein. Patient is in agreement with that we can try to get that scheduled for him for this Friday. 06/13/2019 on evaluation today patient actually appears to be doing much better at this point based on what I am seeing with regard to his wound. He has much less in the way of hyper granular tissue which is good news and a lot more healing that is taken place since I last saw him as far as the amount of space within the wound itself. With that being said he is nearing the completion of the initial 40 treatments for hyperbaric oxygen therapy I think this is something I would recommend extending as he does seem to be benefiting at this time. Fortunately there is no evidence of active systemic infection at this point nor even local infection for that matter. 06/20/2019 upon evaluation today patient actually appears to be doing well with regard to his wound. In fact this appears to be doing much better today compared to what it was previous. I been extremely pleased with the progress that he is made. In fact the  tunnel is much less deep today than what it was  in the past and I am seeing excellent signs of improvement. Overall I am happy with how things are going. I did review his white blood cell count which revealed everything to be okay. The one abnormal finding on his CBC was a hemoglobin of 12.7. Subsequently I did also review his sed rate and C-reactive protein. His sed rate measured in at normal level measuring at 26. The C-reactive protein however was elevated today and an abnormal range indicating inflammation. Still I feel like he is trending in a good direction at this time. He is going to need a refill of the Levaquin he tells me at this point. 07/04/2019 on evaluation today patient actually appears to be doing well in regard to his wound. He has been tolerating the dressing changes without complication. Fortunately there is no signs of active infection which is good news. No fevers, chills, nausea, vomiting, or diarrhea. The good news also is that I did review his labs and though his sed rate was slightly elevated the C-reactive protein was actually down compared to the last evaluation 2 weeks ago this is excellent news I think it is showing signs of improvement. 07/11/2019 on evaluation today patient actually appears to be doing quite well with regard to his wound on my evaluation. This is very slow going but nonetheless he seems to be making progress. Especially in regard to the depth. Nonetheless I believe that we may want to consider going forward with the PriMatrix which was previously approved for him. We will get a have to get a new approval as we will actually be applying this in the new year so we will likely obtain that approval on January 4. In the meantime and for the time being my suggestion is going to be that we go ahead and continue with the wound VAC and we were going at this point with the current wound care measures. 12/28-Patient seen after HBO session today, wound depth with  the tunnel measures 3.2 cm, the wound bed appears healthy, continuing with the VAC and HBO 07/25/2019 on evaluation today patient's wound appears to be doing about the same compared to my last evaluation. Fortunately there is no evidence of significant infection which is good news we will still waiting on the results of his lab work which unfortunately was not done prior to today's visit which is what we were aiming for. Ultimately there is no sign of active infection at this time which is good news. And he also tells me that the wound VAC has been staying in place and doing fairly well which is also good news. With that being said I am concerned about the fact that the wound has not progressed as much AYON, GIPPLE. ([id number]) I think we may want to look into getting reapproval for the skin substitute, specifically PriMatrix, that I am hopeful will help to allow this area to granulate in more effectively. I would recommend as well continuing with the wound VAC along with this. 08/01/2019 on evaluation today patient appears to be doing in my opinion slightly better in regard to the wound. He actually does have some tightening of the walls around the edges which is a good thing. Fortunately there is no signs of active infection at this time. No fever chills noted. I do believe that the patient in general seems to be making progress although is very slow. Obviously were trying to get things to speed up somewhat more. No fevers, chills, nausea, vomiting, or diarrhea.  08/08/2019 upon evaluation today patient appears to be doing well with regard to his wound although there is not a significant improvement overall we do have the PriMatrix for availability today to apply. Fortunately there is no signs of active infection and the patient overall seems to be doing well. There is really no need for aggressive sharp debridement at this point either which is also good news. I do believe her get a need to  suture and the primary checks in the basin in the PriMatrix in order to keep it in place at the base of the wound underneath the wound VAC. This was discussed with the patient today as well. He is in agreement the plan. He really does not have any filling in the area and so this should not be a complication as far as suturing is concerned. 08/15/2019 on evaluation today patient appears to be doing somewhat better in my opinion in regard to the overall appearance of his wound. He has been tolerating the dressing changes without complication. Fortunately the skin subappear to still be in place when this was changed on Friday. With that being said it appears that the PriMatrix may have dissolved in the interim between now and then as the sutures are no longer holding anything in place and again I would sutured it into place. Nonetheless I believe that we can go ahead and likely consider going forward that we may need to apply the PriMatrix weekly as opposed every other week as it does seem to have dissolved rather readily and well. Overall the wound bed appears to be doing excellent at this time. 08/22/2019 upon evaluation today the patient does seem to making some progress with regard to his wound. Overall there is some better quality tissue and less of the hyper granular protrusions all of which is good news. With that being said we do have the PriMatrix available for application today and I am in a try to find a better way to suture this in place for him today. 08/29/2019 on evaluation today patient appears to be doing very well in regard to his wound. I do see signs of new granulation which seems to be somewhat helpful he did have some need for sharp debridement today to clear some of the hyper granulation away but nonetheless I feel like we are headed in a good direction. Fortunately there is no evidence of any systemic infection although we are going to go ahead and reorder the CBC, C-reactive  protein, and sed rate today to see where things stand I did just refill his Levaquin as well. Objective Constitutional Well-nourished and well-hydrated in no acute distress. Vitals Time Taken: 8:14 AM, Height: 73 in, Weight: 210 lbs, BMI: 27.7, Temperature: 98.6 F, Pulse: 85 bpm, Respiratory Rate: 16 breaths/min, Blood Pressure: 134/85 mmHg. Respiratory normal breathing without difficulty. Psychiatric this patient is able to make decisions and demonstrates good insight into disease process. Alert and Oriented x 3. pleasant and cooperative. ELISE, LORIS ([id number]) General Notes: Patient's wound bed currently again did require some mild sharp debridement to clear away some of the necrotic tissue following debridement I then did apply the PriMatrix to the base of the wound following this I did go ahead and utilize Sorbact in order to cut a piece to place and over top of the PriMatrix and then subsequently this was sutured down using 6 simple interrupted 4-0 Vicryl sutures. The patient tolerated the process without having any pain whatsoever. This should allow for the  wound VAC to be applied and hopefully this should not tear through and will protect anything from causing trouble with the PriMatrix which needs to stay in place. The home health nurse should not be messing with the Sorbact it should be still there when I see him next week. All this was discussed with the patient and his wife today as well. Integumentary (Hair, Skin) Wound #1 status is Open. Original cause of wound was Pressure Injury. The wound is located on the Right Gluteal fold. The wound measures 2.5cm length x 1.9cm width x 2.5cm depth; 3.731cm^2 area and 9.327cm^3 volume. There is muscle and Fat Layer (Subcutaneous Tissue) Exposed exposed. There is no undermining noted, however, there is tunneling at 12:00 with a maximum distance of 3.6cm. There is a large amount of serous drainage noted. The wound margin is  epibole. There is large (67-100%) pink, hyper - granulation within the wound bed. There is a small (1-33%) amount of necrotic tissue within the wound bed including Adherent Slough. 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 There was a Excisional Skin/Subcutaneous Tissue Debridement with a total area of 4.75 sq cm performed by [name], Jacquelina Hewins E., [name]. With the following instrument(s): Curette to remove Viable and Non-Viable tissue/material. Material removed includes Subcutaneous Tissue and Slough and after achieving pain control using Lidocaine. No specimens were taken. A time out was conducted at 08:35, prior to the start of the procedure. A Moderate amount of bleeding was controlled with Pressure. The procedure was tolerated well. Post Debridement Measurements: 2.5cm length x 1.9cm width x 2.5cm depth; 9.327cm^3 volume. Post debridement Stage noted as Category/Stage IV. Character of Wound/Ulcer Post Debridement is stable. Post procedure Diagnosis Wound #1: Same as Pre-Procedure Pre-procedure diagnosis of Wound #1 is a Pressure Ulcer located on the Right Gluteal fold. A skin graft procedure using a bioengineered skin substitute/cellular or tissue based product was performed by [name], Takao Lizer E., [name] with the following instrument(s): Curette and Scissors. Primatrix was applied and secured with Sutured. 4 sq cm of product was utilized and 0 sq cm was wasted. Post Application, Sorbact was applied. A Time Out was conducted at 08:43, prior to the start of the procedure. The procedure was tolerated well. Post procedure Diagnosis Wound #1: Same as  Pre-Procedure General Notes: 6 sutures in wound bed to secure sorbact. WISIN, BRANSTROM ([id number]) Plan Wound Cleansing: Wound #1 Right Gluteal fold: Clean wound with Normal Saline. - in office Cleanse wound with mild soap and water Skin Barriers/Peri-Wound Care: Skin Prep Primary Wound Dressing: Wound #1 Right Gluteal fold: Saline moistened gauze - in clinic only Other: - contact layer applied over top of Primatrix sutured to secure under wound vac Dressing Change Frequency: Wound #1 Right Gluteal fold: Change Dressing Monday, Wednesday, Friday Follow-up Appointments: Wound #1 Right Gluteal fold: Return Appointment in 1 week. Off-Loading: Wound #1 Right Gluteal fold: Turn and reposition every 2 hours - no pressure on wounded area Home Health: Wound #1 Right Gluteal fold: Continue Home Health Visits - Advanced: remove dressing down to contact layer. Do not remove below the contact layer- Primatrix applied. Home Health Nurse may visit PRN to address patient s wound care needs. FACE TO FACE ENCOUNTER: MEDICARE and MEDICAID PATIENTS:  I certify that this patient is under my care and that I had a face-to-face encounter that meets the physician face-to-face encounter requirements with this patient on this date. The encounter with the patient was in whole or in part for the following MEDICAL CONDITION: (primary reason for Reading) MEDICAL NECESSITY: I certify, that based on my findings, NURSING services are a medically necessary home health service. HOME BOUND STATUS: I certify that my clinical findings support that this patient is homebound (i.e., Due to illness or injury, pt requires aid of supportive devices such as crutches, cane, wheelchairs, walkers, the use of special transportation or the assistance of another person to leave their place of residence. There is a normal inability to leave the home and doing so requires considerable and taxing effort. Other absences are  for medical reasons / religious services and are infrequent or of short duration when for other reasons). If current dressing causes regression in wound condition, may D/C ordered dressing product/s and apply Normal Saline Moist Dressing daily until next [place] / Other MD appointment. [place] of regression in wound condition at [phone]. Please direct any NON-WOUND related issues/requests for orders to patient's Primary Care Physician Negative Pressure Wound Therapy: Wound #1 Right Gluteal fold: Wound VAC settings at 125/130 mmHg continuous pressure. Use BLACK/GREEN foam to wound cavity. Use WHITE foam to fill any tunnel/s and/or undermining. Change VAC dressing 3 X WEEK. Change canister as indicated when full. Nurse may titrate settings and frequency of dressing changes as clinically indicated. - Primatrix skin substitute applied in clinic. Sutured into wound tunnel at 12:00. Do not remove. Apply wound vac as normal, sponge over contact layer. Home Health Nurse may d/c VAC for s/s of increased infection, significant wound regression, or uncontrolled drainage. [place] at [phone]. Apply contact layer over base of wound. - contact layer over sutured Primatrix in tunnel at 12:00. Number of foam/gauze pieces used in the dressing = Advanced Therapies: Wound #1 Right Gluteal fold: Primatrix application in clinic; including contact layer, fixation with steri-strips/sutures, dry gauze and cover dressing. - in clinic. HH will apply wound vac. Medications-please add to medication list.: Wound #1 Right Gluteal fold: P.O. Antibiotics - Continue antibiotics. RHONIN, LAURIANO ([id number]) Laboratory ordered were: CBC W Auto Differential panel ordered were: Sedmentation Rate and C-Reatctive Protein 1. My suggestion at this time is can be that we going continue with the current wound care measures with regard to the PriMatrix I did apply  that today we will order one for next week although if we do not need it for some reason we can always reserve it for the following. 2. I still recommend appropriate and frequent offloading I think the more that we can do that the better. 3. I am also going to recommend that we continue with weekly visits I still think that is best. 4. I do want to go ahead and repeat a CBC, C-reactive protein, and sed rate to see how they compare to the last check that we had. We will see patient back for reevaluation in 1 week here in the clinic. If anything worsens or changes patient will contact our office for additional recommendations. Electronic Signature(s) Signed: 08/29/2019 5:40:32 PM By: Worthy Keeler [name] Entered By: Worthy Keeler on 08/29/2019 17:40:32 BERDELL, PETRELLA ([id number]) -------------------------------------------------------------------------------- SuperBill Details Patient Name: [name] Date of Service: 08/29/2019 Medical Record Number: [id number] Patient Account Number: [id number] Date of Birth/Sex: Aug 13, 1950 (68 y.o.  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, sequela I10 Essential (primary) hypertension Facility Procedures CPT4 Code: [id number] Description: [id number]- Primatrix, 2 X 2 sq cm Modifier: Quantity: 4 CPT4 Code: [id number] Description: [id number] - SKIN SUB GRAFT TRNK/ARM/LEG ICD-10 Diagnosis Description L89.314 Pressure ulcer of right buttock, stage 4 Modifier: Quantity: 1 Physician Procedures CPT4 Code: [id number] Description: [id number] -  WC PHYS SKIN SUB GRAFT TRNK/ARM/LEG ICD-10 Diagnosis Description L89.314 Pressure ulcer of right buttock, stage 4 Modifier: Quantity: 1 Electronic Signature(s) Signed: 08/30/2019 11:26:03 AM By: Gretta Cool, BSN, RN, CWS, Kim RN, BSN Signed: 08/30/2019 11:40:44 AM By: Worthy Keeler [name] Previous Signature: 08/29/2019 5:40:52 PM Version By: Worthy Keeler [name] Entered By: Gretta Cool, BSN, RN, CWS, Kim on 08/30/2019 11:26:02

## 2019-08-30 NOTE — Progress Notes (Signed)
NATHNAEL, SHAWCROFT ([id number]) Visit Report for 08/29/2019 Arrival Information Details Patient Name: [name], [name] Date of Service: 08/29/2019 8:00 AM Medical Record Number: [id number] Patient Account Number: [id number] Date of Birth/Sex: 06-09-1951 (69 y.o. M) Treating RN: Montey Hora Primary Care Florence Yeung: [name] Other Clinician: Referring Lena Fieldhouse: [name] Treating Sinai Illingworth/Extender: [name], HOYT Weeks in Treatment: 1 Visit Information History Since Last Visit Added or deleted any medications: No Patient Arrived: Wheel Chair Any new allergies or adverse reactions: No Arrival Time: 08:12 Had a fall or experienced change in No Accompanied By: wife activities of daily living that may affect Transfer Assistance: None risk of falls: Patient Identification Verified: Yes Signs or symptoms of abuse/neglect since last visito No Secondary Verification Process Completed: Yes Hospitalized since last visit: No Patient Requires Transmission-Based No Implantable device outside of the clinic excluding No Precautions: cellular tissue based products placed in the center Patient Has Alerts: Yes since last visit: Patient Alerts: NOT Has Dressing in Place as Prescribed: Yes Diabetic Pain Present Now: Yes Electronic Signature(s) Signed: 08/29/2019 4:23:54 PM By: Montey Hora Entered By: Montey Hora on 08/29/2019 08:12:39 [name] ([id number]) -------------------------------------------------------------------------------- Encounter Discharge Information Details Patient Name: [name] Date of Service: 08/29/2019 8:00 AM Medical Record Number: [id number] Patient Account Number: [id number] Date of Birth/Sex: [birth year] (69 y.o. M) Treating RN: [name] Primary Care Kamiah Fite: [name] Other Clinician: Referring Odalis Jordan: [name] Treating Kyrianna Barletta/Extender: [name], HOYT Weeks in Treatment: 49 Encounter Discharge  Information Items Post Procedure Vitals Discharge Condition: Stable Temperature (F): 98.6 Ambulatory Status: Ambulatory Pulse (bpm): 85 Discharge Destination: Home Respiratory Rate (breaths/min): 16 Transportation: Private Auto Blood Pressure (mmHg): 135/85 Accompanied By: self Schedule Follow-up Appointment: No Clinical Summary of Care: Electronic Signature(s) Signed: 08/30/2019 4:33:39 PM By: Gretta Cool, BSN, RN, CWS, Kim RN, BSN Entered By: Gretta Cool, BSN, RN, CWS, Kim on 08/29/2019 08:52:36 [name], [name] ([id number]) -------------------------------------------------------------------------------- Lower Extremity Assessment Details Patient Name: [name] Date of Service: 08/29/2019 8:00 AM Medical Record Number: [id number] Patient Account Number: [id number] Date of Birth/Sex: 06-06-1951 (69 y.o. M) Treating RN: Montey Hora Primary Care Olivier Frayre: [name] Other Clinician: Referring Cinda Hara: [name] Treating Carmel Garfield/Extender: [name], HOYT Weeks in [id number] Electronic Signature(s) Signed: 08/29/2019 4:23:54 PM By: Montey Hora Entered By: Montey Hora on 08/29/2019 08:14:33 [name], [name] ([id number]) -------------------------------------------------------------------------------- Multi Wound Chart Details Patient Name: [name] Date of Service: 08/29/2019 8:00 AM Medical Record Number: [id number] Patient Account Number: [id number] Date of Birth/Sex: [birth year] (69 y.o. M) Treating RN: [name] Primary Care Efrem Pitstick: [name] Other Clinician: Referring Shanautica Forker: [name] Treating Stevie Charter/Extender: [name], HOYT Weeks in Treatment: 91 Vital Signs Height(in): 73 Pulse(bpm): 85 Weight(lbs): 210 Blood Pressure(mmHg): 134/85 Body Mass Index(BMI): 28 Temperature(F): 98.6 Respiratory Rate 16 (breaths/min): Photos: [N/A:N/A] Wound Location: Right Gluteal fold N/A N/A Wounding Event: Pressure Injury  N/A N/A Primary Etiology: Pressure Ulcer N/A N/A Comorbid History: Cataracts, Hypertension, N/A N/A Paraplegia Date Acquired: 11/02/2017 N/A N/A Weeks of Treatment: 91 N/A N/A Wound Status: Open N/A N/A Measurements L x W x D 2.5x1.9x2.5 N/A N/A (cm) Area (cm) : 3.731 N/A N/A Volume (cm) : 9.327 N/A N/A % Reduction in Area: 62.90% N/A N/A % Reduction in Volume: -828.10% N/A N/A Position 1 (o'clock): 12 Maximum Distance 1 (cm): 3.6 Tunneling: Yes N/A N/A Classification: Category/Stage IV N/A N/A Exudate Amount: Large N/A N/A Exudate Type: Serous N/A N/A Exudate Color: amber N/A N/A Wound Margin: Epibole N/A N/A Granulation Amount: Large (67-100%) N/A  N/A Granulation Quality: Pink, Hyper-granulation N/A N/A Necrotic Amount: Small (1-33%) N/A N/A Exposed Structures: Fat Layer (Subcutaneous N/A N/A Tissue) Exposed: Yes Muscle: Yes Fascia: No [name], [name] ([id number]) Tendon: No Joint: No Bone: No Epithelialization: Small (1-33%) N/A N/A Treatment Notes Electronic Signature(s) Signed: 08/30/2019 4:33:39 PM By: Gretta Cool, BSN, RN, CWS, Kim RN, BSN Entered By: Gretta Cool, BSN, RN, CWS, Kim on 08/29/2019 08:33:20 [name], [name] ([id number]) -------------------------------------------------------------------------------- Multi-Disciplinary Care Plan Details Patient Name: [name] Date of Service: 08/29/2019 8:00 AM Medical Record Number: [id number] Patient Account Number: [id number] Date of Birth/Sex: Jun 16, 1951 (69 y.o. M) Treating RN: [name] Primary Care Eleanore Junio: [name] Other Clinician: Referring Gayatri Teasdale: [name] Treating Cana Mignano/Extender: [name], HOYT Weeks in Treatment: 66 Active Inactive Orientation to the Wound Care Program Nursing Diagnoses: Knowledge deficit related to the wound healing center program Goals: Patient/caregiver will verbalize understanding of the [facility] Program Date Initiated: 11/30/2017 Target  Resolution Date: 12/21/2017 Goal Status: Active Interventions: Provide education on orientation to the wound center Notes: Osteomyelitis Nursing Diagnoses: Infection: osteomyelitis Goals: Patient's osteomyelitis will resolve Date Initiated: 08/29/2019 Target Resolution Date: 09/28/2019 Goal Status: Active Signs and symptoms for osteomyelitis will be recognized and promptly addressed Date Initiated: 08/29/2019 Target Resolution Date: 09/28/2019 Goal Status: Active Interventions: Assess for signs and symptoms of osteomyelitis resolution every visit Treatment Activities: Systemic antibiotics : 08/29/2019 Notes: Pressure Nursing Diagnoses: Knowledge deficit related to management of pressures ulcers Goals: [name], [name] ([id number]) Patient/caregiver will verbalize understanding of pressure ulcer management Date Initiated: 05/17/2018 Target Resolution Date: 05/28/2018 Goal Status: Active Interventions: Assess: immobility, friction, shearing, incontinence upon admission and as needed Notes: Wound/Skin Impairment Nursing Diagnoses: Impaired tissue integrity Goals: Patient/caregiver will verbalize understanding of skin care regimen Date Initiated: 11/30/2017 Target Resolution Date: 12/21/2017 Goal Status: Active Ulcer/skin breakdown will have a volume reduction of 30% by week 4 Date Initiated: 11/30/2017 Target Resolution Date: 12/21/2017 Goal Status: Active Interventions: Assess patient/caregiver ability to obtain necessary supplies Assess patient/caregiver ability to perform ulcer/skin care regimen upon admission and as needed Assess ulceration(s) every visit Treatment Activities: Skin care regimen initiated : 11/30/2017 Notes: Electronic Signature(s) Signed: 08/30/2019 4:33:39 PM By: Gretta Cool, BSN, RN, CWS, Kim RN, BSN Entered By: Gretta Cool, BSN, RN, CWS, Kim on 08/29/2019 08:33:09 [name], [name]  ([id number]) -------------------------------------------------------------------------------- Pain Assessment Details Patient Name: [name] Date of Service: 08/29/2019 8:00 AM Medical Record Number: [id number] Patient Account Number: [id number] Date of Birth/Sex: 1950-10-27 (69 y.o. M) Treating RN: Montey Hora Primary Care Malay Fantroy: [name] Other Clinician: Referring Kamareon Sciandra: [name] Treating Kimiko Common/Extender: [name], HOYT Weeks in Treatment: 53 Active Problems Location of Pain Severity and Description of Pain Patient Has Paino Yes Site Locations Pain Location: Pain in Ulcers With Dressing Change: Yes Rate the pain. Current Pain Level: 4 Pain Management and Medication Current Pain Management: Electronic Signature(s) Signed: 08/29/2019 4:23:54 PM By: Montey Hora Entered By: Montey Hora on 08/29/2019 08:13:07 [name], [name] ([id number]) -------------------------------------------------------------------------------- Patient/Caregiver Education Details Patient Name: [name] Date of Service: 08/29/2019 8:00 AM Medical Record Number: [id number] Patient Account Number: [id number] Date of Birth/Gender: 1951-06-29 (69 y.o. M) Treating RN: [name] Primary Care Physician: [name] Other Clinician: Referring Physician: [name] Treating Physician/Extender: Sharalyn Ink in Treatment: 39 Education Assessment Education Provided To: Patient Education Topics Provided Pressure: Handouts: Pressure Ulcers: Care and Offloading, Other: keep all pressure off of wound Methods: Explain/Verbal Responses: State content correctly Wound/Skin Impairment: Handouts: Caring for Your Ulcer, Other: wound care  as prescribed Methods: Demonstration, Explain/Verbal Responses: State content correctly Electronic Signature(s) Signed: 08/30/2019 4:33:39 PM By: Gretta Cool, BSN, RN, CWS, Kim RN, BSN Entered By: Gretta Cool, BSN, RN, CWS,  Kim on 08/29/2019 08:49:25 [name], [name] ([id number]) -------------------------------------------------------------------------------- Wound Assessment Details Patient Name: [name] Date of Service: 08/29/2019 8:00 AM Medical Record Number: [id number] Patient Account Number: [id number] Date of Birth/Sex: [birth year] (69 y.o. M) Treating RN: Montey Hora Primary Care Ariv Penrod: [name] Other Clinician: Referring Pelagia Iacobucci: [name] Treating Endy Easterly/Extender: [name], HOYT Weeks in Treatment: 91 Wound Status Wound Number: 1 Primary Etiology: Pressure Ulcer Wound Location: Right Gluteal fold Wound Status: Open Wounding Event: Pressure Injury Comorbid History: Cataracts, Hypertension, Paraplegia Date Acquired: 11/02/2017 Weeks Of Treatment: 91 Clustered Wound: No Photos Wound Measurements Length: (cm) 2.5 % Reductio Width: (cm) 1.9 % Reductio Depth: (cm) 2.5 Epithelial Area: (cm) 3.731 Tunneling Volume: (cm) 9.327 Positi Maximum n in Area: 62.9% n in Volume: -828.1% ization: Small (1-33%) : Yes on (o'clock): 12 Distance: (cm) 3.6 Undermining: No Wound Description Classification: Category/Stage IV Foul Odor Wound Margin: Epibole Slough/Fib Exudate Amount: Large Exudate Type: Serous Exudate Color: amber After Cleansing: No rino Yes Wound Bed Granulation Amount: Large (67-100%) Exposed Structure Granulation Quality: Pink, Hyper-granulation Fascia Exposed: No Necrotic Amount: Small (1-33%) Fat Layer (Subcutaneous Tissue) Exposed: Yes Necrotic Quality: Adherent Slough Tendon Exposed: No Muscle Exposed: Yes Necrosis of Muscle: No Joint Exposed: No [name], [name]. ([id number]) Bone Exposed: No Treatment Notes Wound #1 (Right Gluteal fold) Notes Primatrix, Sorbact, saline moistened gauze, cover dressing. HH to replace wound vac later today. Electronic Signature(s) Signed: 08/29/2019 4:23:54 PM By: Montey Hora Entered By:  Montey Hora on 08/29/2019 08:24:49 [name] ([id number]) -------------------------------------------------------------------------------- [place] Details Patient Name: [name] Date of Service: 08/29/2019 8:00 AM Medical Record Number: [id number] Patient Account Number: [id number] Date of Birth/Sex: 02/18/1951 (69 y.o. M) Treating RN: Montey Hora Primary Care Jaevian Shean: [name] Other Clinician: Referring Shatori Bertucci: [name] Treating Yohana Bartha/Extender: [name], HOYT Weeks in Treatment: 91 Vital Signs Time Taken: 08:14 Temperature (F): 98.6 Height (in): 73 Pulse (bpm): 85 Weight (lbs): 210 Respiratory Rate (breaths/min): 16 Body Mass Index (BMI): 27.7 Blood Pressure (mmHg): 134/85 Reference Range: 80 - 120 mg / dl Electronic Signature(s) Signed: 08/29/2019 4:23:54 PM By: Montey Hora Entered By: Montey Hora on 08/29/2019 08:14:23

## 2019-09-05 ENCOUNTER — Encounter: Payer: Medicare Other | Admitting: Physician Assistant

## 2019-09-05 ENCOUNTER — Other Ambulatory Visit: Payer: Self-pay

## 2019-09-05 DIAGNOSIS — L89314 Pressure ulcer of right buttock, stage 4: Secondary | ICD-10-CM | POA: Diagnosis not present

## 2019-09-05 NOTE — Progress Notes (Signed)
[name] ([id number]) Visit Report for 09/05/2019 Chief Complaint Document Details Patient Name: [name], [name] Date of Service: 09/05/2019 8:00 AM Medical Record Number: [id number] Patient Account Number: [id number] Date of Birth/Sex: April 18, 1951 (68 y.o. M) Treating RN: Primary Care Provider: Tracie [name] Other Clinician: Referring Provider: Tracie [name] Treating Provider/Extender: [name], [name] Weeks in Treatment: 44 Information Obtained from: Patient Chief Complaint Right gluteal fold ulcer Electronic Signature(s) Signed: 09/05/2019 4:43:11 PM By: Brett Keeler [name] Entered By: [name] on 09/05/2019 08:14:03 [name] ([id number]) -------------------------------------------------------------------------------- HPI Details Patient Name: [name] Date of Service: 09/05/2019 8:00 AM Medical Record Number: [id number] Patient Account Number: [id number] Date of Birth/Sex: 03/08/1951 (68 y.o. 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 He states that there is no history of diabetes that he is aware of. He does have issues with his prostate and is currently receiving treatment for this by way of oral medication. With that being said I do not have a lot of details in that regard. Nonetheless the patient presents today as a result of having been referred to [place] by another provider initially home health was set to come out and take care of his wound although due to the fact that he apparently drives he's not  able to receive home health. His wife is therefore trying to help take care of this wound within although they have been struggling with what exactly to do at this point. She states that she can do some things but she is definitely not a nurse and does have some issues with looking at blood. The good news is the wound does not appear to be too deep and is fairly superficial at this point. There is no slough noted there is some nonviable skin noted around the surface of the wound and the perimeter at this point. The central portion of the wound appears to be very good with a dermal layer noted this does not appear to be again deep enough to extend it to subcutaneous tissue at this point. 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 In the pump he has Dilaudid and baclofen. 12/07/17 on evaluation today patient presents for follow-up concerning his ongoing lower back thigh ulcer on the right. He states that he did not get the supplies ordered and therefore has not really been able to perform the dressing changes as directed exactly. His wife was able to get some Boarder Foam Dressing's from the drugstore and subsequently has been using hydrogel which did help to a degree in the wound does appear to be able smaller. There is actually more drainage this week noted than previous. 12/21/17 on evaluation today patient appears to be doing rather well in regard to his right gluteal ulcer. He has been tolerating the dressing changes without complication. There does not appear to be any evidence of infection at this point in time. Overall the wound does seem to be making some progress as far as the edges are concerned there's not  as much in the way of overlapping of the external wound edges and he has a good epithelium to wound bed border for the most part. This however is not true right at the 12 o'clock  location over the span of a little over a centimeters which actually will require debridement today to clean this away and hopefully allow it to continue to heal more appropriately. 12/28/17 on evaluation today patient appears to be doing rather well in regard to his ulcer in the left gluteal region. He's been tolerating the dressing changes without complication. Apparently he has had some difficulty getting his dressing material. Apparently there's been some confusion with ordering we're gonna check into this. Nonetheless overall he's been showing signs of improvement which is good news. Debridement is not required today. 01/04/18 on evaluation today patient presents for follow-up concerning his right gluteal ulcer. He has been tolerating the dressing changes fairly well. On inspection today it appears he may actually have some maceration them concerned about the fact that he may be developing too much moisture in and around the wound bed which can cause delay in healing. With that being said he unfortunately really has not showed significant signs of improvement since last week's evaluation in fact this may even be just the little bit/slightly larger. Nonetheless he's been having a lot of discomfort I'm not sure this is even related to the wound as he has no pain when I'm to breeding or otherwise cleaning the wound during evaluation today. Nonetheless this is something that we did recommend he talked to his pain specialist concerning. 01/11/18 on evaluation today patient appears to be doing better in regard to his ulceration. He has been tolerating the dressing changes without complication. With that being said overall there's no evidence of infection which is good news. The only thing is he did receive the hatch affair blue classic versus the ready nonetheless I feel like this is perfectly fine and appears to have done well for him over the past week. 01/25/18 on evaluation today patient's wound  actually appears to be a little bit larger than during the last evaluation. The good [name], [name] ([id number]) news is the majority of the wound edges actually appear to be fairly firmly attached to the wound bed unfortunately again we're not really making progress in regard to the size. Roughly the wound is about the same size as when I first saw him although again the wound margin/edges appear to be much better. 02/01/18 on evaluation today patient actually appears to be doing very well in regard to his wound. Applying the Prisma dry does seem to be better although he does still have issues with slow progression of the wound. There was a slight improvement compared to last week's measurements today. Nonetheless I have been considering other options as far as the possibility of Theraskin or even a snap vac. In general I'm not sure that the Theraskin due to location of the wound would be a very good idea. Nonetheless I do think that a snap vac could be a possibility for the patient and in fact I think this could even be an excellent way to manage the wound possibly seeing some improvement in a very rapid fashion here. Nonetheless this is something that we would need to get approved and I did have a lengthy conversation with the patient about this today. 02/08/18 on evaluation today patient appears to be doing a little better in regard to his ulcer. He has been tolerating  the dressing changes without complication. Fortunately despite the fact that the wound is a little bit smaller it's not significantly so unfortunately. We have discussed the possibility of a snap vac we did check with insurance this is actually covered at this point. Fortunately there does not appear to be any sign of infection. Overall I'm fairly pleased with how things seem to be appearing at this point. 02/15/18 on evaluation today patient appears to be doing rather well in regard to his right gluteal ulcer. Unfortunately the  snap vac did not stay in place with his sheer and friction this came loose and did not seem to maintain seal very well. He worked for about two days and it did seem to do very well during that time according to his wife but in general this does not seem to be something that's gonna be beneficial for him long-term. I do believe we need to go back to standard dressings to see if we can find something that will be of benefit. 03/02/18- He is here in follow up evaluation; there is minimal change in the wound. He will continue with the same treatment plan, would consider changing to iodosrob/iodoflex if ulcer continues to to plateau. He will follow up next week 03/08/18 on evaluation today patient's wound actually appears to be about the same size as when I previously saw him several weeks back. Unfortunately he does have some slightly dark discoloration in the central portion of the wound which has me concerned about pressure injury. I do believe he may be sitting for too long a period of time in fact he tells me that "I probably sit for much too long". He does have some Slough noted on the surface of the wound and again as far as the size of the wound is concerned I'm really not seeing anything that seems to have improved significantly. 03/15/18 on evaluation today patient appears to be doing fairly well in regard to his ulcer. The wound measured pretty much about the same today compared to last week's evaluation when looking at his graph. With that being said the area of bruising/deep tissue injury that was noted last week I do not see at this point. He did get a new cushion fortunately this does seem to be have been of benefit in my pinion. It does appear that he's been off of this more which is good news as well I think that is definitely showing in the overall wound measurements. With that being said I do believe that he needs to continue to offload I don't think that the fact this is doing better  should be or is going to allow him to not have to offload and explain this to him as well. Overall he seems to be in agreement the plan I think he understands. The overall appearance of the wound bed is improved compared to last week I think the Iodoflex has been beneficial in that regard. 03/29/18 on evaluation today patient actually appears to be doing rather well in regard to his wound from the overall appearance standpoint he does have some granulation although there's some Slough on the surface of the wound noted as well. With that being said he unfortunately has not improved in regard to the overall measurement of the wound in volume or in size. I did have a discussion with him very specifically about offloading today. He actually does work although he mainly is just sitting throughout the day. He tells me he offloads by "lifting  himself up for 30 seconds off of his chair occasionally" purchase from [employer] which does seem to have helped. And he has a new cushion that he with that being said he's also able to stand some for a very short period of time but not significant enough I think to provide appropriate offloading. I think the biggest issue at this point with the wound and the fact is not healing as quickly as we would like is due to the fact that he is really not able to appropriately offload while at work. He states the beginning after his injury he actually had a bed at his job that he could lay on in order to offload and that does seem to have been of help back at that time. Nonetheless he had not done this in quite some time unfortunately. I think that could be helpful for him this is something I would like for him to look into. 04/05/18 on evaluation today patient actually presents for follow-up concerning his right gluteal ulcer. Again he really is not significantly improved even compared to last week. He has been tolerating the dressing changes without complication.  With that being said fortunately there appears to be no evidence of infection at this time. He has been more proactive in trying to offload. [name], [name] ([id number]) 04/12/18 on evaluation today patient actually appears to be doing a little better in regard to his wound and the right gluteal fold region. He's been tolerating the dressing changes since removing the oasis without complication. However he was having a lot of burning initially with the oasis in place. He's unsure of exactly why this was given so much discomfort but he assumes that it was the oasis itself causing the problem. Nonetheless this had to be removed after about three days in place although even those three days seem to have made a fairly good improvement in regard to the overall appearance of the wound bed. In fact is the first time that he's made any improvement from the standpoint of measurements in about six weeks. He continues to have no discomfort over the area of the wound itself which leads me to wonder why he was having the burning with the oasis when he does not even feel the actual debridement's themselves. I am somewhat perplexed by this. 04/19/18 on evaluation today patient's wound actually appears to be showing signs of epithelialization around the edge of the wound and in general actually appears to be doing better which is good news. He did have the same burning after about three days with applying the Endoform last week in the same fashion that I would generally apply a skin substitute. This seems to indicate that it's not the oasis to cause the problem but potentially the moisture buildup that just causes things to burn or there may be some other reaction with the skin prep or Steri-Strips. Nonetheless I'm not sure that is gonna be able to tolerate any skin substitute for a long period of time. The good news is the wound actually appears to be doing better today compared to last week and does seem to  finally be making some progress. 04/26/18 on evaluation today patient actually appears to be doing rather well in regard to his ulcer in the right gluteal fold. He has been tolerating the dressing changes without complication which is good news. The Endoform does seem to be helping although he was a little bit more macerated this week. This seems to be an  ongoing issue with fluid control at this point. Nonetheless I think we may be able to add something like Drawtex to help control the drainage. 05/03/18 on evaluation today patient appears to actually be doing better in regard to the overall appearance of his wound. He has been tolerating the dressing changes without complication. Fortunately there appears to be no evidence of infection at this time. I really feel like his wound has shown signs as of today of turning around last week I thought so as well and definitely he could be seen in this week's overall appearance and measurements. In general I'm very pleased with the fact that he finally seems to be making a steady but sure progress. The patient likewise is very pleased. 05/17/18 on evaluation today patient appears to be doing more poorly unfortunately in regard to his ulcer. He has been tolerating the dressing changes without complication. With that being said he tells me that in the past couple of days he and his wife have noticed that we did not seem to be doing quite as well is getting dark near the center. Subsequently upon evaluation today the wound actually does appear to be doing worse compared to previous. He has been tolerating the dressing changes otherwise and he states that he is not been sitting up anymore than he was in the past from what he tells me. Still he has continued to work he states "I'm tired of dealing with this and if I have to just go home and lay in the bed all the time that's what I'll do". Nonetheless I am concerned about the fact that this wound does appear to be  deeper than what it was previous. 05/24/18 upon evaluation today patient actually presents after having been in the hospital due to what was presumed to be sepsis secondary to the wound infection. He had an elevated white blood cell count between 14 and 15. With that being said he does seem to be doing somewhat better now. His wound still is giving him some trouble nonetheless and he is obviously concerned about the fact likely talked about that this does seem to go more deeply than previously noted. I did review his wound culture which showed evidence of Staphylococcus aureus him and group B strep. Nonetheless he is on antibiotics, Levaquin, for this. Subsequently I did review his intake summary from the hospital as well. I also did look at the CT of the lumbar spine with contrast that was performed which showed no bone destruction to suggest lumbar disguises/osteomyelitis or sacral osteomyelitis. There was no paraspinal abscess. Nonetheless it appears this may have been more of just a soft tissue infection at this point which is good news. He still is nonetheless concerned about the wound which again I think is completely reasonable considering everything he's been through recently. 05/31/18 on evaluation today on evaluation today patient actually appears to be showing signs of his wound be a little bit deeper than what I would like to see. Fortunately he does not show any signs of significant infection although his temperature was 99 today he states he's been checking this at home and has not been elevated. Nonetheless with the undermining that I'm seeing at this point I am becoming more concerned about the wound I do think that offloading is a key factor here that is preventing the speedy recovery at this point. There does not appear to be any evidence of again over infection noted. He's been using Santyl currently. 06/07/18 the  patient presents today for follow-up evaluation regarding the left  ulcer in the gluteal region. He has been tolerating the Wound VAC fairly well. He is obviously very frustrated with this he states that to mean is really getting in his way. There does not appear to be any evidence of infection at this time he does have a little bit of odor I do not necessarily associate this with infection just something that we sometimes notice with Wound VAC therapy. With that being said I can definitely catch a tone of discontentment overall in the patient's demeanor today. This when he was previously in the hospital [name], [name]. ([id number]) an CT scan was done of the lumbar region which did not reveal any signs of osteomyelitis. With that being said the pelvis in particular was not evaluated distinctly which means he could still have some osteonecrosis I. Nonetheless the Wound VAC was started on Thursday I do want to get this little bit more time before jumping to a CT scan of the pelvis although that is something that I might would recommend if were not see an improvement by that time. 06/14/18 on evaluation today patient actually appears to be doing about the same in regard to his right gluteal ulcer. Again he did have a CT scan of the lumbar spine unfortunately this did not include the pelvis. Nonetheless with the depth of the wound that I'm seeing today even despite the fact that I'm not seeing any evidence of overt cellulitis I believe there's a good chance that we may be dealing with osteomyelitis somewhere in the right Ischial region. No fevers, chills, nausea, or vomiting noted at this time. 06/21/18 on evaluation today patient actually appears to be doing about the same with regard to his wound. The tunnel at 6 o'clock really does not appear to be any deeper although it is a little bit wider. I think at this point you may want to start packing this with white phone. Unfortunately I have not got approval for the CT scan of the pelvis as of yet due to the fact  that Medicare apparently has been denied it due to the diagnosis codes not being appropriate according to Medicare for the test requested. With that being said the patient cannot have an MRI and therefore this is the only option that we have as far as testing is concerned. The patient has had infection and was on antibiotics and been added code for cellulitis of the bottom to see if this will be appropriate for getting the test approved. Nonetheless I'm concerned about the infection have been spread deeper into the Ischial region. 06/28/18 on evaluation today patient actually appears to be doing rather well all things considered in regard to the right gluteal ulcer. He has been tolerating the dressing changes without complication. With that being said the Wound VAC he states does have to be replaced almost every day or at least reinforced unfortunately. Patient actually has his CT scan later this morning we should have the results by tomorrow. 07/05/18 on evaluation today patient presents for follow-up concerning his right Ischial ulcer. He did see the surgeon Dr. Lysle Pearl last week. They were actually very happy with him and felt like he spent a tremendous amount of time with them as far as discussing his situation was concerned. In the end Dr. Lysle Pearl did contact me as well and determine that he would not recommend any surgical intervention at this point as he felt like it would not be  in the patient's best interest based on what he was seeing. He recommended a referral to infectious disease. Subsequently this is something that Dr. Ines Bloomer office is working on setting up for the patient. As far as evaluation today is concerned the patient's wound actually appears to be worse at this point. I am concerned about how things are progressing and specifically about infection. I do not feel like it's the deeper but the area of depth is definitely widening which does have me concerned. No fevers, chills,  nausea, or vomiting noted at this time. I think that we do need initiate antibiotic therapy the patient has an allow allergy to amoxicillin/penicillin he states that he gets a rash since childhood. Nonetheless she's never had the issues with Catholics or cephalosporins in general but he is aware of. 07/27/18 on evaluation today patient presents following admission to the hospital on 07/09/18. He was subsequently discharged on 07/20/18. On 07/15/18 the patient underwent irrigation and debridement was soft tissue biopsy and bone biopsy as well as placement of a Wound VAC in the OR by Dr. Celine Ahr. During the hospital course the patient was placed on a Wound VAC and recommended follow up with surgery in three weeks actually with Dr. Delaine Lame who is infectious disease. The patient was on vancomycin during the hospital course. 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 He also had a bone culture which revealed evidence of methicillin-resistant staph aureus. He is updated CT scan 07/09/18 reveals that he had progression of the which was performed on wound to breakdown down to the trochanter where he actually had irregularities there as well suggestive of osteomyelitis. This was a change just since 9 December when we last performed a CT scan. Obviously this one had gone downhill quite significantly and rapidly. At this point upon evaluation I feel like in general the patient's wound seems to be doing fairly well all things considered upon my evaluation today. Obviously this is larger and deeper than what I previously evaluated but at the same time he seems to be making some progress as far as the appearance of the granulation tissue is concerned. I'm happy in that regard. No fevers, chills, nausea, or vomiting noted at this time. He is on IV vancomycin and Rocephin at the facility. He is currently in [employer]. 08/03/18 upon evaluation today patient's wound appears  to be doing better in regard to the overall appearance at this point in time. Fortunately he's been tolerating the Wound VAC without complication and states that the facility has been taking excellent care of the wound site. Overall I see some Slough noted on the surface which I am going to attempt sharp debridement today of but nonetheless other than this I feel like he's making progress. 08/09/18 on evaluation today patient's wound appears to be doing much better compared to even last week's evaluation. Do believe that the Wound VAC is been of great benefit for him. He has been tolerating the dressing changes that is the Wound [name], [name]. ([id number]) VAC without any complication and he has excellent granulation noted currently. There is no need for sharp debridement at this point. 08/16/18 on evaluation today patient actually appears to be doing very well in regard to the wound in the right gluteal fold region. This is showing signs of progress and again appears to be very healthy which is excellent news. Fortunately there is no sign of active infection by way of odor or drainage at this  point. Overall I'm very pleased with how things stand. He seems to be tolerating the Wound VAC without complication. 08/23/18 on evaluation today patient actually appears to be doing better in regard to his wound. He has been tolerating the Wound VAC without complication and in fact it has been collecting a significant amount of drainage which I think is good news especially considering how the wound appears. Fortunately there is no signs of infection at this time definitely nothing appears to be worse which is good news. He has not been started on the Bactrim and Flagyl that was recommended by Dr. Delaine Lame yet. I did actually contact her office this morning in order to check and see were things are that regard their gonna be calling me back. 08/30/18 on evaluation today patient actually appears to show  signs of excellent improvement today compared to last evaluation. The undermining is getting much better the wound seems to be feeling quite nicely and I'm very pleased that the granulation in general. With that being said overall I feel like the patient has made excellent progress which is great news. No fevers, chills, nausea, or vomiting noted at this time. 09/06/18 on evaluation today patient actually appears to be doing rather well in regard to his right gluteal ulcer. This is showing signs of improvement in overall I'm very pleased with how things seem to be progressing. The patient likewise is please. Overall I see no evidence of infection he is about to complete his oral antibiotic regimen which is the end of the antibiotics for him in just about three days. 09/13/18 on evaluation today patient's right Ischial ulcer appears to be showing signs of continued improvement which is excellent news. He's been tolerating the dressing changes without complication. Fortunately there's no signs of infection and the wound that seems to be doing very well. 09/28/18 on evaluation today patient appears to be doing rather well in regard to his right Ischial ulcer. He's been tolerating the Wound VAC without complication he knows there's much less drainage than there used to be this obviously is not a bad thing in my pinion. There's no evidence of infection despite the fact is but nothing about it now for several weeks. 10/04/18 on evaluation today patient appears to be doing better in regard to his right Ischial wound. He has been tolerating the Wound VAC without complication and I do believe that the silver nitrate last week was beneficial for him. Fortunately overall there's no evidence of active infection at this time which is great news. No fevers, chills, nausea, or vomiting noted at this time. 10/11/18 on evaluation today patient actually appears to be doing rather well in regard to his Ischial ulcer. He's  been tolerating the Wound VAC still without complication I feel like this is doing a good job. No fevers, chills, nausea, or vomiting noted at this time. 11/01/18 on evaluation today patient presents after having not been seen in our clinic for several weeks secondary to the fact that he was on evaluation today patient presents after having not been seen in our clinic for several weeks secondary to the fact that he was in a skilled nursing facility which was on lockdown currently due to the covert 19 national emergency. Subsequently he was discharged from the facility on this past Friday and subsequently made an appointment to come in to see yesterday. Fortunately there's no signs of active infection at this time which is good news and overall he does seem to have made  progress since I last saw. Overall I feel like things are progressing quite nicely. The patient is having no pain. 11/08/18 on evaluation today patient appears to be doing okay in regard to his right gluteal ulcer. He has been utilizing a Wound VAC home health this changing this at this point since he's home from the skilled nursing facility. Fortunately there's no signs of obvious active infection at this time. Unfortunately though there's no obvious active infection he is having some maceration and his wife states that when the sheets of the Wound VAC office on Sunday when it broke seal that he ended up having significant issues with some smell as well there concerned about the possibility of infection. Fortunately there's No fevers, chills, nausea, or vomiting noted at this time. 11/15/18 on evaluation today patient actually appears to be doing well in regard to his right gluteal ulcer. He has been tolerating the dressing changes without complication. Specifically the Wound VAC has been utilized up to this point. Fortunately there's no signs of infection and overall I feel like he has made progress even since last week when I last  saw him. I'm actually fairly happy with the overall appearance although he does seem to have somewhat of a hyper granular Whitis, Alvan J. ([id number]) overgrowth in the central portion of the wound which I think may require some sharp debridement to try flatness out possibly utilizing chemical cauterization following. 11/23/18 on evaluation today patient actually appears to be doing very well in regard to his sacral ulcer. He seems to be showing signs of improvement with good granulation. With that being said he still has the small area of hyper granulation right in the central portion of the wound which I'm gonna likely utilize silver nitrate on today. Subsequently he also keeps having a leak at the 6 o'clock location which is unfortunate we may be able to help out with some suggestions to try to prevent this going forward. Fortunately there's no signs of active infection at this time. 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 He's been tolerating the dressing changes without complication. Fortunately there's no signs of active infection at this time. I've been rather pleased with how things have progressed there still some evidence of pressure getting to the area with some redness right around the immediate wound opening. Nonetheless other than this I'm not seeing any significant complications or issues the wound is somewhat hyper granular. Upon discussing with the patient and his wife today I'm not sure that the wound is being packed to the base with the foam at this point. And if it's not been packed fully that may be part of the reason why is not seen as much improvement as far as the granulation from the base out. Again we do not want pack too tightly but we need some of the firm to get to the base of the wound. I discussed this with patient and his wife today. 12/06/18 on evaluation today  patient appears to be doing well in regard to his right gluteal pressure ulcer. He's been tolerating the dressing changes without complication. Fortunately there's no signs of active infection. He still has some hyper granular tissue and I do think it would be appropriate to continue with the chemical cauterization as of today. 12/16/18 on evaluation today patient actually appears to be doing okay in regard to his right gluteal ulcer. He is  been tolerating the dressing changes without complication including the Wound VAC. Overall I feel like nothing seems to be worsening I do feel like that the hyper granulation buds in the central portion of the wound have improved to some degree with the silver nitrate. We will have to see how things continue to progress. 12/20/18 on evaluation today patient actually appears to be doing much worse in my pinion even compared to last week's evaluation. Unfortunately as opposed to showing any signs of improvement the areas of hyper granular tissue in the central portion of the wound seem to be getting worse. Subsequently the wound bed itself also seems to be getting deeper even compared to last week which is both unusual as well as concerning since prior he had been shown signs of improvement. Nonetheless I think that the issue could be that he's actually having some difficulty in issues with a deeper infection. There's no external signs of infection but nonetheless I am more worried about the internal, osteomyelitis, that could be restarting. He has not been on antibiotics for some time at this point. I think that it may be a good idea to go ahead and started back on an antibiotic therapy while we wait to see what the testing shows. 12/27/18 on evaluation today patient presents for follow-up concerning his left gluteal fold wound. Fortunately he appears to be doing well today. I did review the CT scan which was negative for any signs of osteomyelitis or acute abnormality  this is excellent news. Overall I feel like the surface of the wound bed appears to be doing significantly better today compared to previously noted findings. There does not appear any signs of infection nor does he have any pain at this time. 01/03/19 on evaluation today patient actually appears to be doing quite well in regard to his ulcer. Post debridement last week he really did not have too much bleeding which is good news. Fortunately today this seems to be doing some better but we still has some of the hyper granular tissue noted in the base of the wound which is gonna require sharp debridement today as well. Overall I'm pleased with how things seem to be progressing since we switched away from the Wound VAC I think he is making some progress. 01/10/19 on evaluation today patient appears to be doing better in regard to his right gluteal fold ulcer. He has been tolerating the dressing changes without complication. The debridement to seem to be helping with current away some of the poor hyper granular tissue bugs throughout the region of his gluteal fold wound. He's been tolerating the dressing changes otherwise without complication which is great news. No fevers, chills, nausea, or vomiting noted at this time. 01/17/19 on evaluation today patient actually appears to be doing excellent in regard to his wound. He's been tolerating the dressing changes without complication. Fortunately there is no signs of active infection at this time which is great news. No fevers, chills, nausea, or vomiting noted at this time. 01/24/19 on evaluation today patient actually appears to be doing quite well with regard to his ulcer. He has been tolerating the dressing changes without complication. Fortunately there's no signs of active infection at this time. Overall been very pleased with the progress that he seems to be making currently. 01/31/19-Patient returns at 1 week with apparent similarity in dimensions to the  wound, with no signs of infection, he has been Fults ([id number]) changing dressings twice a day 02/08/19 upon  evaluation today patient actually appears to be doing well with regard to his right Ischial ulcer. The wound is not appear to be quite as deep and seems to be making progress which is good news. With that being said I'm still reluctant to go back to the Wound VAC at this point. He's been having to change the dressings twice a day which is a little bit much in my pinion from the wound care supplies standpoint. I think that possibly attempting to utilize extras orbit may be beneficial this may also help to prevent any additional breakdown secondary to fluid retention in the wound itself. The patient is in agreement with giving this a try. 02/15/19 on evaluation today patient actually appears to be doing decently well with regard to his ulcer in the right to gluteal fold location. He's been tolerating the dressing changes without complication. Fortunately there is no signs of active infection at this time. He is able to keep the current dressing in place more effectively for a day at a time whereas before he was having a changes to to three times a day. The actions or has been helpful in this regard. Fortunately there's no signs of anything getting worse and I do feel like he showing signs of good improvement with regard to the wound bed status. 02/22/2019 patient appears to be doing very well today with regard to his ulcer in the gluteal fold. Fortunately there is no signs of active infection and he has been tolerating the dressing changes without any complication. Overall extremely pleased with how things seem to be progressing. He has much less of the hyper granular projections within the wound these have slowly been debrided away and he seems to be doing well. The wound bed is more uniform. 03/01/19 on evaluation today patient appears to be doing unfortunately about the same in  regard to his gluteal ulcer. He's been tolerating the dressing changes without complication. Fortunately there's no signs of active infection at this time. With that being said he continues to develop these hyper granular projections which I'm unsure of exactly what they are and why they are rising. Nonetheless I explained to the patient that I do believe it would be a good idea for [place] to stand a biopsy sample for pathology to see if that can shed any light on what exactly may be going on here. Fortunately I do not see any obvious signs of infection. With that being said the patient has had a little bit more drainage this week apparently compared to last week. 03/08/2019 on evaluation today patient actually appears to look somewhat better with regard to the appearance of his wound bed at this time. This is good news. Overall I am very pleased with how things seem to have progressed just in the past week with a switch to the [facility] dressing. I think that has been beneficial for him. With that being said at this time the patient is concerned about his biopsy that I sent off last week unfortunately I do not have that report as of yet. Nonetheless we have called to obtain this and hopefully will hear back from the lab later this morning. 03/15/19 on evaluation today patient's wound actually appears to be doing okay today with regard to the overall appearance of the wound bed. He has been tolerating the dressing changes without complication he still has hyper granular tissue noted but fortunately that seems to be minimal at this point compared to some of what  we've seen in the past. Nonetheless I do think that he is still having some issues currently with some of this type of granulation the biopsy and since all showed nothing more than just evidence of granulation tissue. Therefore there really is nothing different to initiate or do at this point. 03/24/19 on evaluation today patient appears to be  doing a little better with regard to his ulcer. He's been tolerating the dressing changes without complication. Fortunately there is no signs of active infection at this time. No fevers, chills, nausea, or vomiting noted at this time. I'm overall pleased with how things seem to be progressing. 03/29/2019 on evaluation today patient appears to be doing about the same in regard to his ulcer in the right gluteal fold. Unfortunately he is not seeming to make a lot of progress and the wound is somewhat stalled. There is no signs of active infection externally but I am concerned about the possibility of infection continuing in the ischial location which previously he did have infection noted. Again is not able to have an MRI so probably our best option for testing for this would be a triple phase bone scan which will detect subtle changes in the bone more so than plain film x-rays. In the past they really have not been beneficial and in fact the CT scans even have been somewhat questionable at times. Nonetheless there is no signs of systemic infection which is at least good news but again his wound is not healing at all on the predicted schedule. 04/05/19 on evaluation today patient appears to be doing well all things considering with regard to his wound and the right gluteal fold. He actually has his triple bone scan scheduled for sometime in the next couple of days. With that being said I've also been looking to other possibilities of what could be causing hyper granular tissue were looking into the bone scan again for evaluating for the risk or possibility of infection deeper and I'm also gonna go ahead and see about obtaining a deep tissue culture today to send and see if there's any evidence of infection noted on culture. He's in agreement with that plan. 04/12/2019 on evaluation today patient presents for reevaluation here in our clinic concerning ongoing issues with his right gluteal fold ulcer. 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 It was discussed with him at that point that I think it would be appropriate AUDWIN, MOLITORIS. ([id number]) for him to proceed with hyperbaric oxygen therapy especially in light of the fact that he is previously been on IV antibiotics at the beginning of the year for close to 3 months followed by several weeks of oral antibiotics that was all prescribed by infectious disease. He had surgical debridement around Christmas December 2019 due to an abscess and osteomyelitis of the ischial bone. Unfortunately this has not really proceeded as well as we would have liked and again we did a CT scan even a couple months ago as he cannot have an MRI secondary to having issues with both a pain pump as well as a spinal cord stimulator which prevent him from going into an MRI machine. With that being said there were chronic changes noted at the ischial tuberosity which had progressed since December 2019 there was no evidence of fluid collection on that initial CT scan. With that being said that was on January 21, 2019. I am not sure that it was a sensitive enough test as compared to an MRI and then subsequently I ordered a bone scan for him which was actually completed on 03/29/2019 and this revealed that he does indeed have positive osteomyelitis involving the right ischial tuberosity. This is adjacent to the ulcer and I think is the reason that his ulcer is not healing. Subsequently I am in a place him back on oral antibiotics today unfortunately his wound culture showed just mixed gram-negative flora with no specific findings of a predominant organism. Nonetheless being with the location I think a good broad-spectrum antibiotic for gram-negative's is a good choice at this point he is previously taken Levaquin without significant issues and I think that is an  appropriate antibiotic for him at this time. 04/19/2019 on evaluation today patient actually appears to be doing fairly well with regard to his wound. He has been tolerating the dressing changes without complication. Fortunately there is no signs of active infection and he has been taking the oral antibiotics at this time. Subsequently we did make a referral to infectious disease although Dr. Steva Ready wants the patient to be seen by general surgery first in order apparently to see if there is anything from a surgical standpoint that should be done prior to initiation of IV antibiotic therapy. Again the patient is okay with actually seeing Dr. Celine Ahr whom he has seen before we will make that referral. 04/26/2019 on evaluation today patient actually appears to be doing well with regard to his wound. He has been tolerating the dressing changes without complication. Fortunately there is no signs of active infection at this time. I do believe that the hyperbaric oxygen therapy along with the antibiotics which I prescribed at this point have been doing well for him. With that being said he has not seen Dr. Celine Ahr the surgeon yet in fact they have not been contacted for scheduling an appointment as of yet. Subsequently the patient is not on antibiotics currently by IV Dr. Steva Ready did want him to see Dr. Celine Ahr first which we are working on trying to get scheduled. We again give the information to the patient today for Dr. Celine Ahr and her number as far as contacting their office to see about getting something scheduled. Again were looking for whether or not they recommend any surgical intervention. 05/03/2019 on evaluation today patient appears to be doing about the same with regard to his wound there may be a little bit of filling in in regard to the base of the wound but still he has quite a bit of hyper granular tissue that I am seeing today. I believe that he may may need a more aggressive sharp  debridement possibly even by the surgeon or under anesthesia in order to clear away some of the hyper granular material in order to help allow for appropriate granulation to fill in. We have made a referral to Dr. Celine Ahr unfortunately it sounds as if they did not receive the referral we contacted them today they should be get in touch with the patient. Depending on how things show from that standpoint the patient may need to see Dr. Ardyth Gal who is the infectious disease specialist although he really does not want a PICC line again. No fevers, chills, nausea, vomiting, or diarrhea. He is tolerating hyperbaric oxygen therapy very well. 05/12/2019 on evaluation today patient presents for follow-up visit concerning his right gluteal fold ulcer. He did see Dr. Celine Ahr the surgeon who previously  evaluated his wound and she actually felt like he was doing quite well with regard to the wound based on what she was seen. He does seem to be responding to some degree with the oral antibiotics along with hyperbaric oxygen therapy at this point she did not see any evidence or need for further surgical intervention at this point. She recommended deferring additional or ongoing antibiotic therapy to Dr. Steva Ready at her discretion. Fortunately there is no signs of active systemic infection. No fevers, chills, nausea, vomiting, or diarrhea. 10/29; patient I am seeing really for the first time today in terms of his wound. 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 He is being treated with hyperbaric oxygen for underlying osteomyelitis most recently documented by a three-phase bone scan. He has been on Levaquin for roughly a month and he is out of these and asked me for a refill. Most recent cultures were negative although I do not think these were bone cultures. He has a very irregular surface to this wound which is almost nodular in texture. It undermines superiorly with the same nodular  hyper granulated surface. I see that he was referred to general surgery for an operative debridement although the surgeon did not agree with this I think that would have been the proper course of action in this. He has been using Hydrofera Blue. He is supposed to start a wound VAC tomorrow which is actually a reapplication of wound VAC. I think mostly last time they had trouble keeping the seal in place I hope we have better look at this this time. 05/24/2019 on evaluation today I did review patient's note from Dr. Dellia Nims where he was seen last week when I was out of the office. Subsequently it does appear that Dr. Dellia Nims was in agreement that the patient really needed debridement to clear away some of the nodular tissue in the base of the wound. The good news is he has the wound VAC initiated and some of this tissue is already starting to breakdown under the treatment of the wound VAC again because it is not really viable. KENSEI, DESRUISSEAUX ([id number]) Nonetheless I do believe that we can likely perform some debridement today to clear this away and hopefully the continuation of the wound VAC along with hyperbarics and antibiotic therapy will be beneficial in stopping this from reoccurring. If we get better control in the wound bed in a good spot I think we can definitely use the PriMatrix which we actually did get approval for. 05/31/2019 on evaluation today patient appears to be doing actually pretty well at this point with regard to his wound. He has been tolerating the dressing changes which is good news. Fortunately there is no signs of infection. He still has some of the hyper granular nodules noted we have not really cleared all these away and I think we can try to do a little bit more that today. I did do quite a bit last week I am hoping to get down to the base of the wound so though actually be able to heal more appropriately. I think the wound VAC is doing a good job right now with  controlling the drainage and overall the patient is happy with that. 06/07/2019 upon evaluation today patient appears to be doing quite well compared to last week with regard to the hyper granulation in the base of the wound. He has been tolerating the dressing changes without complication. Fortunately there is no  signs of active infection at this time. With that being said we have not had any recent measures as far as blood work is concerned I think we may want to check a few things including a CBC, sed rate, and C-reactive protein. Patient is in agreement with that we can try to get that scheduled for him for this Friday. 06/13/2019 on evaluation today patient actually appears to be doing much better at this point based on what I am seeing with regard to his wound. He has much less in the way of hyper granular tissue which is good news and a lot more healing that is taken place since I last saw him as far as the amount of space within the wound itself. With that being said he is nearing the completion of the initial 40 treatments for hyperbaric oxygen therapy I think this is something I would recommend extending as he does seem to be benefiting at this time. Fortunately there is no evidence of active systemic infection at this point nor even local infection for that matter. 06/20/2019 upon evaluation today patient actually appears to be doing well with regard to his wound. In fact this appears to be doing much better today compared to what it was previous. I been extremely pleased with the progress that he is made. In fact the tunnel is much less deep today than what it was in the past and I am seeing excellent signs of improvement. Overall I am happy with how things are going. I did review his white blood cell count which revealed everything to be okay. The one abnormal finding on his CBC was a hemoglobin of 12.7. Subsequently I did also review his sed rate and C-reactive protein. His sed rate  measured in at normal level measuring at 26. The C-reactive protein however was elevated today and an abnormal range indicating inflammation. Still I feel like he is trending in a good direction at this time. He is going to need a refill of the Levaquin he tells me at this point. 07/04/2019 on evaluation today patient actually appears to be doing well in regard to his wound. He has been tolerating the dressing changes without complication. Fortunately there is no signs of active infection which is good news. No fevers, chills, nausea, vomiting, or diarrhea. The good news also is that I did review his labs and though his sed rate was slightly elevated the C-reactive protein was actually down compared to the last evaluation 2 weeks ago this is excellent news I think it is showing signs of improvement. 07/11/2019 on evaluation today patient actually appears to be doing quite well with regard to his wound on my evaluation. This is very slow going but nonetheless he seems to be making progress. Especially in regard to the depth. Nonetheless I believe that we may want to consider going forward with the PriMatrix which was previously approved for him. We will get a have to get a new approval as we will actually be applying this in the new year so we will likely obtain that approval on January 4. In the meantime and for the time being my suggestion is going to be that we go ahead and continue with the wound VAC and we were going at this point with the current wound care measures. 12/28-Patient seen after HBO session today, wound depth with the tunnel measures 3.2 cm, the wound bed appears healthy, continuing with the VAC and HBO 07/25/2019 on evaluation today patient's wound appears to  be doing about the same compared to my last evaluation. Fortunately there is no evidence of significant infection which is good news we will still waiting on the results of his lab work which unfortunately was not done prior  to today's visit which is what we were aiming for. Ultimately there is no sign of active infection at this time which is good news. And he also tells me that the wound VAC has been staying in place and doing fairly well which is also good news. With that being said I am concerned about the fact that the wound has not progressed as much I think we may want to look into getting reapproval for the skin substitute, specifically PriMatrix, that I am hopeful will help to allow this area to granulate in more effectively. I would recommend as well continuing with the wound VAC along with this. 08/01/2019 on evaluation today patient appears to be doing in my opinion slightly better in regard to the wound. He actually does have some tightening of the walls around the edges which is a good thing. Fortunately there is no signs of active BARAN, KLEMZ. ([id number]) infection at this time. No fever chills noted. I do believe that the patient in general seems to be making progress although is very slow. Obviously were trying to get things to speed up somewhat more. No fevers, chills, nausea, vomiting, or diarrhea. 08/08/2019 upon evaluation today patient appears to be doing well with regard to his wound although there is not a significant improvement overall we do have the PriMatrix for availability today to apply. Fortunately there is no signs of active infection and the patient overall seems to be doing well. There is really no need for aggressive sharp debridement at this point either which is also good news. I do believe her get a need to suture and the primary checks in the basin in the PriMatrix in order to keep it in place at the base of the wound underneath the wound VAC. This was discussed with the patient today as well. He is in agreement the plan. He really does not have any filling in the area and so this should not be a complication as far as suturing is concerned. 08/15/2019 on evaluation today  patient appears to be doing somewhat better in my opinion in regard to the overall appearance of his wound. He has been tolerating the dressing changes without complication. Fortunately the skin subappear to still be in place when this was changed on Friday. With that being said it appears that the PriMatrix may have dissolved in the interim between now and then as the sutures are no longer holding anything in place and again I would sutured it into place. Nonetheless I believe that we can go ahead and likely consider going forward that we may need to apply the PriMatrix weekly as opposed every other week as it does seem to have dissolved rather readily and well. Overall the wound bed appears to be doing excellent at this time. 08/22/2019 upon evaluation today the patient does seem to making some progress with regard to his wound. Overall there is some better quality tissue and less of the hyper granular protrusions all of which is good news. With that being said we do have the PriMatrix available for application today and I am in a try to find a better way to suture this in place for him today. 08/29/2019 on evaluation today patient appears to be doing very well in regard  to his wound. I do see signs of new granulation which seems to be somewhat helpful he did have some need for sharp debridement today to clear some of the hyper granulation away but nonetheless I feel like we are headed in a good direction. Fortunately there is no evidence of any systemic infection although we are going to go ahead and reorder the CBC, C-reactive protein, and sed rate today to see where things stand I did just refill his Levaquin as well. 09/05/2019 upon evaluation today patient's wound actually is not appearing to be showing signs of significant improvement compared to what we have seen previous from the standpoint of depth. He overall wound size wise seems to be about the same and again some of the quality of the  wound bed is still doing fairly well. I did review his labs and his CBC was normal with no signs of elevation white blood cell count, his C-reactive protein was normal, and the sed rate was slightly elevated but again I think this may just be due to ulceration really there is nothing significant to indicate severe infection. I think when he likely finishes his current round of oral antibiotics with Levaquin will likely take him off of the medication at that point based on the findings. I do feel like the hyperbarics helped in getting the infection under control as well as improving the overall size of the wound quite significantly. With that being said at this point I really feel like more more at the time frame that he may benefit from a muscle flap in order to try to help this wound heal effectively. I think that this would be the fastest way in order to do this. In the past he has been somewhat resistant to going down that path but nonetheless I feel like he likely should. Electronic Signature(s) Signed: 09/05/2019 3:59:33 PM By: Brett Keeler [name] Entered By: [name] on 09/05/2019 15:59:32 DENNIES, SIKKEMA ([id number]) -------------------------------------------------------------------------------- Physical Exam Details Patient Name: [name] Date of Service: 09/05/2019 8:00 AM Medical Record Number: [id number] Patient Account Number: [id number] Date of Birth/Sex: 18-Sep-1950 (68 y.o. M) Treating RN: Primary Care Provider: Tracie [name] Other Clinician: Referring Provider: Tracie [name] Treating Provider/Extender: [name], Kahlani Graber Weeks in Treatment: 34 Constitutional Well-nourished and well-hydrated in no acute distress. Respiratory normal breathing without difficulty. Psychiatric this patient is able to make decisions and demonstrates good insight into disease process. Alert and Oriented x 3. pleasant and cooperative. Notes Patient's wound bed again did  not appear to show any signs of infection he did still have some hyper granular budding more on the medial portion of the wound but again at this time I really do not see any signs of infection at this point and though the base of the wound seems to be doing well depth wise I really do not feel like you are making much progress here unfortunately despite the PriMatrix that we were utilizing. Electronic Signature(s) Signed: 09/05/2019 4:00:09 PM By: Brett Keeler [name] Entered By: [name] on 09/05/2019 16:00:09 [name], [name] ([id number]) -------------------------------------------------------------------------------- Physician Orders Details Patient Name: [name] Date of Service: 09/05/2019 8:00 AM Medical Record Number: [id number] Patient Account Number: [id number] Date of Birth/Sex: 1950/08/26 (68 y.o. 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 - in office o Cleanse wound with mild soap and water Skin Barriers/Peri-Wound Care o Skin Prep Primary Wound Dressing Wound #1 Right Gluteal fold o Saline moistened gauze - in clinic only Secondary Dressing Wound #1 Right Gluteal fold o Boardered Foam Dressing - In Clinic Dressing Change Frequency Wound #1 Right Gluteal fold o Change Dressing Monday, Wednesday, Friday Follow-up Appointments Wound #1 Right Gluteal fold o Return  Appointment in 1 week. Off-Loading Wound #1 Right Gluteal fold o Turn and reposition every 2 hours - no pressure on wounded area Fountain #1 Right Gluteal fold o [facility] Visits - Advanced DERIEN, GENNARO ([id number]) o Home Health Nurse may visit PRN to address patientos wound care needs. o FACE TO FACE ENCOUNTER: MEDICARE and MEDICAID PATIENTS: I certify that this patient is under my care and that I had a face-to-face encounter that meets the physician face-to-face encounter requirements with this patient on this date. The encounter with the patient was in whole or in part for the following MEDICAL CONDITION: (primary reason for [place]) MEDICAL NECESSITY: I certify, that based on my findings, NURSING services are a medically necessary home health service. HOME BOUND STATUS: I certify that my clinical findings support that this patient is homebound (i.e., Due to illness or injury, pt requires aid of supportive devices such as crutches, cane, wheelchairs, walkers, the use of special transportation or the assistance of another person to leave their place of residence. There is a normal inability to leave the home and doing so requires considerable and taxing effort. Other absences are for medical reasons / religious services and are infrequent or of short duration when for other reasons). o If current dressing causes regression in wound condition, may D/C ordered dressing product/s and apply Normal Saline Moist Dressing daily until next [place] / Other MD appointment. [place] of regression in wound condition at [phone]. o Please direct any NON-WOUND related issues/requests for orders to patient's Primary Care Physician Negative Pressure Wound Therapy Wound #1 Right Gluteal fold o Wound VAC settings at 125/130 mmHg continuous pressure. Use BLACK/GREEN foam to wound cavity. Use WHITE foam to fill any tunnel/s  and/or undermining. Change VAC dressing 3 X WEEK. Change canister as indicated when full. Nurse may titrate settings and frequency of dressing changes as clinically indicated. - Apply wound vac as normal o Home Health Nurse may d/c VAC for s/s of increased infection, significant wound regression, or uncontrolled drainage. [place] at [phone]. o Apply contact layer over base of wound. - contact layer over sutured Primatrix in tunnel at 12:00. o Number of foam/gauze pieces used in the dressing = Medications-please add to medication list. Wound #1 Right Gluteal fold o P.O. Antibiotics - Complete antibiotics. [place] for consult regarding flap. Electronic Signature(s) Signed: 09/05/2019 4:43:11 PM By: Brett Keeler [name] Signed: 09/05/2019 5:08:08 PM By: Gretta Cool BSN, RN, CWS, Kim RN, BSN Entered By: Gretta Cool, BSN, RN, CWS, Kim on 09/05/2019 08:42:25 [name], [name] ([id number]) -------------------------------------------------------------------------------- Problem List Details Patient Name: [name] Date of Service: 09/05/2019 8:00 AM Medical Record Number: [id number] Patient Account Number: [id number] Date of Birth/Sex: 1950-10-03 (68 y.o. M) Treating  RN: Primary Care Provider: Tracie [name] Other Clinician: Referring Provider: Tracie [name] Treating Provider/Extender: [name], Shakerria Parran Weeks in Treatment: 35 Active Problems ICD-10 Evaluated Encounter Code Description Active Date Today Diagnosis L89.314 Pressure ulcer of right buttock, stage 4 11/30/2017 No Yes M86.68 Other chronic osteomyelitis, other site 04/08/2019 No Yes L03.317 Cellulitis of buttock 06/21/2018 No Yes G82.20 Paraplegia, unspecified 11/30/2017 No Yes S34.109S Unspecified injury to unspecified level of lumbar spinal cord, 11/30/2017 No Yes sequela I10 Essential (primary) hypertension 11/30/2017 No Yes Inactive Problems Resolved  Problems Electronic Signature(s) Signed: 09/05/2019 4:43:11 PM By: Brett Keeler [name] Entered By: [name] on 09/05/2019 08:13:49 [name] ([id number]) -------------------------------------------------------------------------------- Progress Note Details Patient Name: [name] Date of Service: 09/05/2019 8:00 AM Medical Record Number: [id number] Patient Account Number: [id number] Date of Birth/Sex: [birth year] (68 y.o. 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 He states that there is no history of diabetes that he is aware of. He does have issues with his prostate and is currently receiving treatment for this by way of oral medication. With that being said I do not have a lot of details in that regard. Nonetheless the patient presents today as a result of having been referred to [place] by another provider initially home health was set to come out and take care of his wound although due to the fact that he apparently drives he's not able to receive home health. His wife is therefore trying to help take care of this wound within although they have been struggling with what exactly to do at this point. She states that she can do some things but she is definitely not a nurse and does have some issues with looking at blood. The good news is the wound does not appear to be too deep and is fairly superficial at this point. There is no slough noted there is some  nonviable skin noted around the surface of the wound and the perimeter at this point. The central portion of the wound appears to be very good with a dermal layer noted this does not appear to be again deep enough to extend it to subcutaneous tissue at this point. 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 In the pump he has Dilaudid and baclofen. 12/07/17 on evaluation today patient presents for follow-up concerning his ongoing lower back thigh ulcer on the right. He states that he did not get the supplies ordered and therefore has not really been able to perform the dressing changes as directed exactly. His wife was able to get some Boarder Foam Dressing's from the drugstore and subsequently has been using hydrogel which did help to a degree in the wound does appear to be able smaller. There is actually more drainage this week noted than previous. 12/21/17 on evaluation today patient appears to be doing rather well in regard to his right gluteal ulcer. He has been tolerating the dressing changes without complication. There does  not appear to be any evidence of infection at this point in time. Overall the wound does seem to be making some progress as far as the edges are concerned there's not as much in the way of overlapping of the external wound edges and he has a good epithelium to wound bed border for the most part. This however is not true right at the 12 o'clock location over the span of a little over a centimeters which actually will require debridement today to clean this away and hopefully allow it to continue to heal more appropriately. 12/28/17 on evaluation today patient appears to be doing rather well in regard to his ulcer in the left gluteal region. He's been tolerating the dressing changes without complication. Apparently he has had some difficulty getting his dressing  material. Apparently there's been some confusion with ordering we're gonna check into this. Nonetheless overall he's been showing signs of improvement which is good news. Debridement is not required today. 01/04/18 on evaluation today patient presents for follow-up concerning his right gluteal ulcer. He has been tolerating the dressing changes fairly well. On inspection today it appears he may actually have some maceration them concerned about the fact that he may be developing too much moisture in and around the wound bed which can cause delay in healing. With that being said he unfortunately really has not showed significant signs of improvement since last week's evaluation in fact this may even be just the little bit/slightly larger. Nonetheless he's been having a lot of discomfort I'm not sure this is even related to the wound as he has no pain when I'm to breeding or otherwise cleaning the wound during evaluation today. Nonetheless this is something that we did recommend he talked to his pain specialist concerning. 01/11/18 on evaluation today patient appears to be doing better in regard to his ulceration. He has been tolerating the dressing Pettry, Nicklous J. ([id number]) changes without complication. With that being said overall there's no evidence of infection which is good news. The only thing is he did receive the hatch affair blue classic versus the ready nonetheless I feel like this is perfectly fine and appears to have done well for him over the past week. 01/25/18 on evaluation today patient's wound actually appears to be a little bit larger than during the last evaluation. The good news is the majority of the wound edges actually appear to be fairly firmly attached to the wound bed unfortunately again we're not really making progress in regard to the size. Roughly the wound is about the same size as when I first saw him although again the wound margin/edges appear to be much  better. 02/01/18 on evaluation today patient actually appears to be doing very well in regard to his wound. Applying the Prisma dry does seem to be better although he does still have issues with slow progression of the wound. There was a slight improvement compared to last week's measurements today. Nonetheless I have been considering other options as far as the possibility of Theraskin or even a snap vac. In general I'm not sure that the Theraskin due to location of the wound would be a very good idea. Nonetheless I do think that a snap vac could be a possibility for the patient and in fact I think this could even be an excellent way to manage the wound possibly seeing some improvement in a very rapid fashion here. Nonetheless this is something that we would need to get approved and I  did have a lengthy conversation with the patient about this today. 02/08/18 on evaluation today patient appears to be doing a little better in regard to his ulcer. He has been tolerating the dressing changes without complication. Fortunately despite the fact that the wound is a little bit smaller it's not significantly so unfortunately. We have discussed the possibility of a snap vac we did check with insurance this is actually covered at this point. Fortunately there does not appear to be any sign of infection. Overall I'm fairly pleased with how things seem to be appearing at this point. 02/15/18 on evaluation today patient appears to be doing rather well in regard to his right gluteal ulcer. Unfortunately the snap vac did not stay in place with his sheer and friction this came loose and did not seem to maintain seal very well. He worked for about two days and it did seem to do very well during that time according to his wife but in general this does not seem to be something that's gonna be beneficial for him long-term. I do believe we need to go back to standard dressings to see if we can find something that will be  of benefit. 03/02/18- He is here in follow up evaluation; there is minimal change in the wound. He will continue with the same treatment plan, would consider changing to iodosrob/iodoflex if ulcer continues to to plateau. He will follow up next week 03/08/18 on evaluation today patient's wound actually appears to be about the same size as when I previously saw him several weeks back. Unfortunately he does have some slightly dark discoloration in the central portion of the wound which has me concerned about pressure injury. I do believe he may be sitting for too long a period of time in fact he tells me that "I probably sit for much too long". He does have some Slough noted on the surface of the wound and again as far as the size of the wound is concerned I'm really not seeing anything that seems to have improved significantly. 03/15/18 on evaluation today patient appears to be doing fairly well in regard to his ulcer. The wound measured pretty much about the same today compared to last week's evaluation when looking at his graph. With that being said the area of bruising/deep tissue injury that was noted last week I do not see at this point. He did get a new cushion fortunately this does seem to be have been of benefit in my pinion. It does appear that he's been off of this more which is good news as well I think that is definitely showing in the overall wound measurements. With that being said I do believe that he needs to continue to offload I don't think that the fact this is doing better should be or is going to allow him to not have to offload and explain this to him as well. Overall he seems to be in agreement the plan I think he understands. The overall appearance of the wound bed is improved compared to last week I think the Iodoflex has been beneficial in that regard. 03/29/18 on evaluation today patient actually appears to be doing rather well in regard to his wound from the overall  appearance standpoint he does have some granulation although there's some Slough on the surface of the wound noted as well. With that being said he unfortunately has not improved in regard to the overall measurement of the wound in volume or in size.  I did have a discussion with him very specifically about offloading today. He actually does work although he mainly is just sitting throughout the day. He tells me he offloads by "lifting himself up for 30 seconds off of his chair occasionally" purchase from [employer] which does seem to have helped. And he has a new cushion that he with that being said he's also able to stand some for a very short period of time but not significant enough I think to provide appropriate offloading. I think the biggest issue at this point with the wound and the fact is not healing as quickly as we would like is due to the fact that he is really not able to appropriately offload while at work. He states the beginning after his injury he actually had a bed at his job that he could lay on in order to offload and that does seem to have been of help back at that time. Nonetheless he had not done this in quite some time unfortunately. I think that could be helpful for him this is something I would like for him to look into. [name], [name] ([id number]) 04/05/18 on evaluation today patient actually presents for follow-up concerning his right gluteal ulcer. Again he really is not significantly improved even compared to last week. He has been tolerating the dressing changes without complication. With that being said fortunately there appears to be no evidence of infection at this time. He has been more proactive in trying to offload. 04/12/18 on evaluation today patient actually appears to be doing a little better in regard to his wound and the right gluteal fold region. He's been tolerating the dressing changes since removing the oasis without complication. However he  was having a lot of burning initially with the oasis in place. He's unsure of exactly why this was given so much discomfort but he assumes that it was the oasis itself causing the problem. Nonetheless this had to be removed after about three days in place although even those three days seem to have made a fairly good improvement in regard to the overall appearance of the wound bed. In fact is the first time that he's made any improvement from the standpoint of measurements in about six weeks. He continues to have no discomfort over the area of the wound itself which leads me to wonder why he was having the burning with the oasis when he does not even feel the actual debridement's themselves. I am somewhat perplexed by this. 04/19/18 on evaluation today patient's wound actually appears to be showing signs of epithelialization around the edge of the wound and in general actually appears to be doing better which is good news. He did have the same burning after about three days with applying the Endoform last week in the same fashion that I would generally apply a skin substitute. This seems to indicate that it's not the oasis to cause the problem but potentially the moisture buildup that just causes things to burn or there may be some other reaction with the skin prep or Steri-Strips. Nonetheless I'm not sure that is gonna be able to tolerate any skin substitute for a long period of time. The good news is the wound actually appears to be doing better today compared to last week and does seem to finally be making some progress. 04/26/18 on evaluation today patient actually appears to be doing rather well in regard to his ulcer in the right gluteal fold. He has been tolerating  the dressing changes without complication which is good news. The Endoform does seem to be helping although he was a little bit more macerated this week. This seems to be an ongoing issue with fluid control at this point. Nonetheless  I think we may be able to add something like Drawtex to help control the drainage. 05/03/18 on evaluation today patient appears to actually be doing better in regard to the overall appearance of his wound. He has been tolerating the dressing changes without complication. Fortunately there appears to be no evidence of infection at this time. I really feel like his wound has shown signs as of today of turning around last week I thought so as well and definitely he could be seen in this week's overall appearance and measurements. In general I'm very pleased with the fact that he finally seems to be making a steady but sure progress. The patient likewise is very pleased. 05/17/18 on evaluation today patient appears to be doing more poorly unfortunately in regard to his ulcer. He has been tolerating the dressing changes without complication. With that being said he tells me that in the past couple of days he and his wife have noticed that we did not seem to be doing quite as well is getting dark near the center. Subsequently upon evaluation today the wound actually does appear to be doing worse compared to previous. He has been tolerating the dressing changes otherwise and he states that he is not been sitting up anymore than he was in the past from what he tells me. Still he has continued to work he states "I'm tired of dealing with this and if I have to just go home and lay in the bed all the time that's what I'll do". Nonetheless I am concerned about the fact that this wound does appear to be deeper than what it was previous. 05/24/18 upon evaluation today patient actually presents after having been in the hospital due to what was presumed to be sepsis secondary to the wound infection. He had an elevated white blood cell count between 14 and 15. With that being said he does seem to be doing somewhat better now. His wound still is giving him some trouble nonetheless and he is obviously concerned about  the fact likely talked about that this does seem to go more deeply than previously noted. I did review his wound culture which showed evidence of Staphylococcus aureus him and group B strep. Nonetheless he is on antibiotics, Levaquin, for this. Subsequently I did review his intake summary from the hospital as well. I also did look at the CT of the lumbar spine with contrast that was performed which showed no bone destruction to suggest lumbar disguises/osteomyelitis or sacral osteomyelitis. There was no paraspinal abscess. Nonetheless it appears this may have been more of just a soft tissue infection at this point which is good news. He still is nonetheless concerned about the wound which again I think is completely reasonable considering everything he's been through recently. 05/31/18 on evaluation today on evaluation today patient actually appears to be showing signs of his wound be a little bit deeper than what I would like to see. Fortunately he does not show any signs of significant infection although his temperature was 99 today he states he's been checking this at home and has not been elevated. Nonetheless with the undermining that I'm seeing at this point I am becoming more concerned about the wound I do think that offloading is a  key factor here that is preventing the speedy recovery at this point. There does not appear to be any evidence of again over infection noted. He's been using Santyl currently. [name], [name] ([id number]) 06/07/18 the patient presents today for follow-up evaluation regarding the left ulcer in the gluteal region. He has been tolerating the Wound VAC fairly well. He is obviously very frustrated with this he states that to mean is really getting in his way. There does not appear to be any evidence of infection at this time he does have a little bit of odor I do not necessarily associate this with infection just something that we sometimes notice with Wound VAC  therapy. With that being said I can definitely catch a tone of discontentment overall in the patient's demeanor today. This when he was previously in the hospital an CT scan was done of the lumbar region which did not reveal any signs of osteomyelitis. With that being said the pelvis in particular was not evaluated distinctly which means he could still have some osteonecrosis I. Nonetheless the Wound VAC was started on Thursday I do want to get this little bit more time before jumping to a CT scan of the pelvis although that is something that I might would recommend if were not see an improvement by that time. 06/14/18 on evaluation today patient actually appears to be doing about the same in regard to his right gluteal ulcer. Again he did have a CT scan of the lumbar spine unfortunately this did not include the pelvis. Nonetheless with the depth of the wound that I'm seeing today even despite the fact that I'm not seeing any evidence of overt cellulitis I believe there's a good chance that we may be dealing with osteomyelitis somewhere in the right Ischial region. No fevers, chills, nausea, or vomiting noted at this time. 06/21/18 on evaluation today patient actually appears to be doing about the same with regard to his wound. The tunnel at 6 o'clock really does not appear to be any deeper although it is a little bit wider. I think at this point you may want to start packing this with white phone. Unfortunately I have not got approval for the CT scan of the pelvis as of yet due to the fact that Medicare apparently has been denied it due to the diagnosis codes not being appropriate according to Medicare for the test requested. With that being said the patient cannot have an MRI and therefore this is the only option that we have as far as testing is concerned. The patient has had infection and was on antibiotics and been added code for cellulitis of the bottom to see if this will be appropriate for  getting the test approved. Nonetheless I'm concerned about the infection have been spread deeper into the Ischial region. 06/28/18 on evaluation today patient actually appears to be doing rather well all things considered in regard to the right gluteal ulcer. He has been tolerating the dressing changes without complication. With that being said the Wound VAC he states does have to be replaced almost every day or at least reinforced unfortunately. Patient actually has his CT scan later this morning we should have the results by tomorrow. 07/05/18 on evaluation today patient presents for follow-up concerning his right Ischial ulcer. He did see the surgeon Dr. Lysle Pearl last week. They were actually very happy with him and felt like he spent a tremendous amount of time with them as far as discussing his situation was  concerned. In the end Dr. Lysle Pearl did contact me as well and determine that he would not recommend any surgical intervention at this point as he felt like it would not be in the patient's best interest based on what he was seeing. He recommended a referral to infectious disease. Subsequently this is something that Dr. Ines Bloomer office is working on setting up for the patient. As far as evaluation today is concerned the patient's wound actually appears to be worse at this point. I am concerned about how things are progressing and specifically about infection. I do not feel like it's the deeper but the area of depth is definitely widening which does have me concerned. No fevers, chills, nausea, or vomiting noted at this time. I think that we do need initiate antibiotic therapy the patient has an allow allergy to amoxicillin/penicillin he states that he gets a rash since childhood. Nonetheless she's never had the issues with Catholics or cephalosporins in general but he is aware of. 07/27/18 on evaluation today patient presents following admission to the hospital on 07/09/18. He was subsequently  discharged on 07/20/18. On 07/15/18 the patient underwent irrigation and debridement was soft tissue biopsy and bone biopsy as well as placement of a Wound VAC in the OR by Dr. Celine Ahr. During the hospital course the patient was placed on a Wound VAC and recommended follow up with surgery in three weeks actually with Dr. Delaine Lame who is infectious disease. The patient was on vancomycin during the hospital course. 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 He also had a bone culture which revealed evidence of methicillin-resistant staph aureus. He is updated CT scan 07/09/18 reveals that he had progression of the which was performed on wound to breakdown down to the trochanter where he actually had irregularities there as well suggestive of osteomyelitis. This was a change just since 9 December when we last performed a CT scan. Obviously this one had gone downhill quite significantly and rapidly. At this point upon evaluation I feel like in general the patient's wound seems to be doing fairly well all things considered upon my evaluation today. Obviously this is larger and deeper than what I previously evaluated but at the same time he seems to be making some progress as far as the appearance of the granulation tissue is concerned. I'm happy in that regard. No fevers, chills, nausea, or vomiting noted at this time. He is on IV vancomycin and Rocephin at the facility. He is currently in [facility]. 08/03/18 upon evaluation today patient's wound appears to be doing better in regard to the overall appearance at this point in time. Fortunately he's been tolerating the Wound VAC without complication and states that the facility has been taking [name], [name]. ([id number]) excellent care of the wound site. Overall I see some Slough noted on the surface which I am going to attempt sharp debridement today of but nonetheless other than this I feel like he's  making progress. 08/09/18 on evaluation today patient's wound appears to be doing much better compared to even last week's evaluation. Do believe that the Wound VAC is been of great benefit for him. He has been tolerating the dressing changes that is the Wound VAC without any complication and he has excellent granulation noted currently. There is no need for sharp debridement at this point. 08/16/18 on evaluation today patient actually appears to be doing very well in regard to the wound in the right gluteal fold region. This  is showing signs of progress and again appears to be very healthy which is excellent news. Fortunately there is no sign of active infection by way of odor or drainage at this point. Overall I'm very pleased with how things stand. He seems to be tolerating the Wound VAC without complication. 08/23/18 on evaluation today patient actually appears to be doing better in regard to his wound. He has been tolerating the Wound VAC without complication and in fact it has been collecting a significant amount of drainage which I think is good news especially considering how the wound appears. Fortunately there is no signs of infection at this time definitely nothing appears to be worse which is good news. He has not been started on the Bactrim and Flagyl that was recommended by Dr. Delaine Lame yet. I did actually contact her office this morning in order to check and see were things are that regard their gonna be calling me back. 08/30/18 on evaluation today patient actually appears to show signs of excellent improvement today compared to last evaluation. The undermining is getting much better the wound seems to be feeling quite nicely and I'm very pleased that the granulation in general. With that being said overall I feel like the patient has made excellent progress which is great news. No fevers, chills, nausea, or vomiting noted at this time. 09/06/18 on evaluation today patient actually  appears to be doing rather well in regard to his right gluteal ulcer. This is showing signs of improvement in overall I'm very pleased with how things seem to be progressing. The patient likewise is please. Overall I see no evidence of infection he is about to complete his oral antibiotic regimen which is the end of the antibiotics for him in just about three days. 09/13/18 on evaluation today patient's right Ischial ulcer appears to be showing signs of continued improvement which is excellent news. He's been tolerating the dressing changes without complication. Fortunately there's no signs of infection and the wound that seems to be doing very well. 09/28/18 on evaluation today patient appears to be doing rather well in regard to his right Ischial ulcer. He's been tolerating the Wound VAC without complication he knows there's much less drainage than there used to be this obviously is not a bad thing in my pinion. There's no evidence of infection despite the fact is but nothing about it now for several weeks. 10/04/18 on evaluation today patient appears to be doing better in regard to his right Ischial wound. He has been tolerating the Wound VAC without complication and I do believe that the silver nitrate last week was beneficial for him. Fortunately overall there's no evidence of active infection at this time which is great news. No fevers, chills, nausea, or vomiting noted at this time. 10/11/18 on evaluation today patient actually appears to be doing rather well in regard to his Ischial ulcer. He's been tolerating the Wound VAC still without complication I feel like this is doing a good job. No fevers, chills, nausea, or vomiting noted at this time. 11/01/18 on evaluation today patient presents after having not been seen in our clinic for several weeks secondary to the fact that he was on evaluation today patient presents after having not been seen in our clinic for several weeks secondary to  the fact that he was in a skilled nursing facility which was on lockdown currently due to the covert 19 national emergency. Subsequently he was discharged from the facility on this past Friday and  subsequently made an appointment to come in to see yesterday. Fortunately there's no signs of active infection at this time which is good news and overall he does seem to have made progress since I last saw. Overall I feel like things are progressing quite nicely. The patient is having no pain. 11/08/18 on evaluation today patient appears to be doing okay in regard to his right gluteal ulcer. He has been utilizing a Wound VAC home health this changing this at this point since he's home from the skilled nursing facility. Fortunately there's no signs of obvious active infection at this time. Unfortunately though there's no obvious active infection he is having some maceration and his wife states that when the sheets of the Wound VAC office on Sunday when it broke seal that he ended up having significant issues with some smell as well there concerned about the possibility of infection. Fortunately there's No fevers, chills, nausea, or vomiting noted at this time. [name], [name] ([id number]) 11/15/18 on evaluation today patient actually appears to be doing well in regard to his right gluteal ulcer. He has been tolerating the dressing changes without complication. Specifically the Wound VAC has been utilized up to this point. Fortunately there's no signs of infection and overall I feel like he has made progress even since last week when I last saw him. I'm actually fairly happy with the overall appearance although he does seem to have somewhat of a hyper granular overgrowth in the central portion of the wound which I think may require some sharp debridement to try flatness out possibly utilizing chemical cauterization following. 11/23/18 on evaluation today patient actually appears to be doing very well in  regard to his sacral ulcer. He seems to be showing signs of improvement with good granulation. With that being said he still has the small area of hyper granulation right in the central portion of the wound which I'm gonna likely utilize silver nitrate on today. Subsequently he also keeps having a leak at the 6 o'clock location which is unfortunate we may be able to help out with some suggestions to try to prevent this going forward. Fortunately there's no signs of active infection at this time. 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 He's been tolerating the dressing changes without complication. Fortunately there's no signs of active infection at this time. I've been rather pleased with how things have progressed there still some evidence of pressure getting to the area with some redness right around the immediate wound opening. Nonetheless other than this I'm not seeing any significant complications or issues the wound is somewhat hyper granular. Upon discussing with the patient and his wife today I'm not sure that the wound is being packed to the base with the foam at this point. And if it's not been packed fully that may be part of the reason why is not seen as much improvement as far as the granulation from the base out. Again we do not want pack too tightly but we need some of the firm to get to the base of the wound. I discussed this with patient and his wife today. 12/06/18 on evaluation today patient appears to be doing well in regard to his right gluteal pressure ulcer. He's been tolerating the dressing changes without complication. Fortunately there's no signs of active infection. He still has some hyper granular tissue and I do think it would  be appropriate to continue with the chemical cauterization as of today. 12/16/18 on evaluation today patient actually appears to be doing okay in  regard to his right gluteal ulcer. He is been tolerating the dressing changes without complication including the Wound VAC. Overall I feel like nothing seems to be worsening I do feel like that the hyper granulation buds in the central portion of the wound have improved to some degree with the silver nitrate. We will have to see how things continue to progress. 12/20/18 on evaluation today patient actually appears to be doing much worse in my pinion even compared to last week's evaluation. Unfortunately as opposed to showing any signs of improvement the areas of hyper granular tissue in the central portion of the wound seem to be getting worse. Subsequently the wound bed itself also seems to be getting deeper even compared to last week which is both unusual as well as concerning since prior he had been shown signs of improvement. Nonetheless I think that the issue could be that he's actually having some difficulty in issues with a deeper infection. There's no external signs of infection but nonetheless I am more worried about the internal, osteomyelitis, that could be restarting. He has not been on antibiotics for some time at this point. I think that it may be a good idea to go ahead and started back on an antibiotic therapy while we wait to see what the testing shows. 12/27/18 on evaluation today patient presents for follow-up concerning his left gluteal fold wound. Fortunately he appears to be doing well today. I did review the CT scan which was negative for any signs of osteomyelitis or acute abnormality this is excellent news. Overall I feel like the surface of the wound bed appears to be doing significantly better today compared to previously noted findings. There does not appear any signs of infection nor does he have any pain at this time. 01/03/19 on evaluation today patient actually appears to be doing quite well in regard to his ulcer. Post debridement last week he really did not have too much  bleeding which is good news. Fortunately today this seems to be doing some better but we still has some of the hyper granular tissue noted in the base of the wound which is gonna require sharp debridement today as well. Overall I'm pleased with how things seem to be progressing since we switched away from the Wound VAC I think he is making some progress. 01/10/19 on evaluation today patient appears to be doing better in regard to his right gluteal fold ulcer. He has been tolerating the dressing changes without complication. The debridement to seem to be helping with current away some of the poor hyper granular tissue bugs throughout the region of his gluteal fold wound. He's been tolerating the dressing changes otherwise without complication which is great news. No fevers, chills, nausea, or vomiting noted at this time. 01/17/19 on evaluation today patient actually appears to be doing excellent in regard to his wound. He's been tolerating the dressing changes without complication. Fortunately there is no signs of active infection at this time which is great news. No fevers, chills, nausea, or vomiting noted at this time. [name], [name] ([id number]) 01/24/19 on evaluation today patient actually appears to be doing quite well with regard to his ulcer. He has been tolerating the dressing changes without complication. Fortunately there's no signs of active infection at this time. Overall been very pleased with the progress that he seems to  be making currently. 01/31/19-Patient returns at 1 week with apparent similarity in dimensions to the wound, with no signs of infection, he has been changing dressings twice a day 02/08/19 upon evaluation today patient actually appears to be doing well with regard to his right Ischial ulcer. The wound is not appear to be quite as deep and seems to be making progress which is good news. With that being said I'm still reluctant to go back to the Wound VAC at this  point. He's been having to change the dressings twice a day which is a little bit much in my pinion from the wound care supplies standpoint. I think that possibly attempting to utilize extras orbit may be beneficial this may also help to prevent any additional breakdown secondary to fluid retention in the wound itself. The patient is in agreement with giving this a try. 02/15/19 on evaluation today patient actually appears to be doing decently well with regard to his ulcer in the right to gluteal fold location. He's been tolerating the dressing changes without complication. Fortunately there is no signs of active infection at this time. He is able to keep the current dressing in place more effectively for a day at a time whereas before he was having a changes to to three times a day. The actions or has been helpful in this regard. Fortunately there's no signs of anything getting worse and I do feel like he showing signs of good improvement with regard to the wound bed status. 02/22/2019 patient appears to be doing very well today with regard to his ulcer in the gluteal fold. Fortunately there is no signs of active infection and he has been tolerating the dressing changes without any complication. Overall extremely pleased with how things seem to be progressing. He has much less of the hyper granular projections within the wound these have slowly been debrided away and he seems to be doing well. The wound bed is more uniform. 03/01/19 on evaluation today patient appears to be doing unfortunately about the same in regard to his gluteal ulcer. He's been tolerating the dressing changes without complication. Fortunately there's no signs of active infection at this time. With that being said he continues to develop these hyper granular projections which I'm unsure of exactly what they are and why they are rising. Nonetheless I explained to the patient that I do believe it would be a good idea for [place] to stand a  biopsy sample for pathology to see if that can shed any light on what exactly may be going on here. Fortunately I do not see any obvious signs of infection. With that being said the patient has had a little bit more drainage this week apparently compared to last week. 03/08/2019 on evaluation today patient actually appears to look somewhat better with regard to the appearance of his wound bed at this time. This is good news. Overall I am very pleased with how things seem to have progressed just in the past week with a switch to the [facility] dressing. I think that has been beneficial for him. With that being said at this time the patient is concerned about his biopsy that I sent off last week unfortunately I do not have that report as of yet. Nonetheless we have called to obtain this and hopefully will hear back from the lab later this morning. 03/15/19 on evaluation today patient's wound actually appears to be doing okay today with regard to the overall appearance of the wound  bed. He has been tolerating the dressing changes without complication he still has hyper granular tissue noted but fortunately that seems to be minimal at this point compared to some of what we've seen in the past. Nonetheless I do think that he is still having some issues currently with some of this type of granulation the biopsy and since all showed nothing more than just evidence of granulation tissue. Therefore there really is nothing different to initiate or do at this point. 03/24/19 on evaluation today patient appears to be doing a little better with regard to his ulcer. He's been tolerating the dressing changes without complication. Fortunately there is no signs of active infection at this time. No fevers, chills, nausea, or vomiting noted at this time. I'm overall pleased with how things seem to be progressing. 03/29/2019 on evaluation today patient appears to be doing about the same in regard to his ulcer in the right  gluteal fold. Unfortunately he is not seeming to make a lot of progress and the wound is somewhat stalled. There is no signs of active infection externally but I am concerned about the possibility of infection continuing in the ischial location which previously he did have infection noted. Again is not able to have an MRI so probably our best option for testing for this would be a triple phase bone scan which will detect subtle changes in the bone more so than plain film x-rays. In the past they really have not been beneficial and in fact the CT scans even have been somewhat questionable at times. Nonetheless there is no signs of systemic infection which is at least good news but again his wound is not healing at all on the predicted schedule. 04/05/19 on evaluation today patient appears to be doing well all things considering with regard to his wound and the right gluteal fold. He actually has his triple bone scan scheduled for sometime in the next couple of days. With that being said I've also been looking to other possibilities of what could be causing hyper granular tissue were looking into the bone scan again for evaluating for the risk or possibility of infection deeper and I'm also gonna go ahead and see about obtaining a deep tissue Barge, Zoran J. ([id number]) culture today to send and see if there's any evidence of infection noted on culture. He's in agreement with that plan. 04/12/2019 on evaluation today patient presents for reevaluation here in our clinic concerning ongoing issues with his right gluteal fold ulcer. 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 It was discussed with him at that point that I think it would be appropriate for him to proceed with hyperbaric oxygen therapy especially in light of the fact that he is previously been on IV antibiotics at the  beginning of the year for close to 3 months followed by several weeks of oral antibiotics that was all prescribed by infectious disease. He had surgical debridement around Christmas December 2019 due to an abscess and osteomyelitis of the ischial bone. Unfortunately this has not really proceeded as well as we would have liked and again we did a CT scan even a couple months ago as he cannot have an MRI secondary to having issues with both a pain pump as well as a spinal cord stimulator which prevent him from going into an MRI machine. With that being said there were chronic  changes noted at the ischial tuberosity which had progressed since December 2019 there was no evidence of fluid collection on that initial CT scan. With that being said that was on January 21, 2019. I am not sure that it was a sensitive enough test as compared to an MRI and then subsequently I ordered a bone scan for him which was actually completed on 03/29/2019 and this revealed that he does indeed have positive osteomyelitis involving the right ischial tuberosity. This is adjacent to the ulcer and I think is the reason that his ulcer is not healing. Subsequently I am in a place him back on oral antibiotics today unfortunately his wound culture showed just mixed gram-negative flora with no specific findings of a predominant organism. Nonetheless being with the location I think a good broad-spectrum antibiotic for gram-negative's is a good choice at this point he is previously taken Levaquin without significant issues and I think that is an appropriate antibiotic for him at this time. 04/19/2019 on evaluation today patient actually appears to be doing fairly well with regard to his wound. He has been tolerating the dressing changes without complication. Fortunately there is no signs of active infection and he has been taking the oral antibiotics at this time. Subsequently we did make a referral to infectious disease although Dr.  Steva Ready wants the patient to be seen by general surgery first in order apparently to see if there is anything from a surgical standpoint that should be done prior to initiation of IV antibiotic therapy. Again the patient is okay with actually seeing Dr. Celine Ahr whom he has seen before we will make that referral. 04/26/2019 on evaluation today patient actually appears to be doing well with regard to his wound. He has been tolerating the dressing changes without complication. Fortunately there is no signs of active infection at this time. I do believe that the hyperbaric oxygen therapy along with the antibiotics which I prescribed at this point have been doing well for him. With that being said he has not seen Dr. Celine Ahr the surgeon yet in fact they have not been contacted for scheduling an appointment as of yet. Subsequently the patient is not on antibiotics currently by IV Dr. Steva Ready did want him to see Dr. Celine Ahr first which we are working on trying to get scheduled. We again give the information to the patient today for Dr. Celine Ahr and her number as far as contacting their office to see about getting something scheduled. Again were looking for whether or not they recommend any surgical intervention. 05/03/2019 on evaluation today patient appears to be doing about the same with regard to his wound there may be a little bit of filling in in regard to the base of the wound but still he has quite a bit of hyper granular tissue that I am seeing today. I believe that he may may need a more aggressive sharp debridement possibly even by the surgeon or under anesthesia in order to clear away some of the hyper granular material in order to help allow for appropriate granulation to fill in. We have made a referral to Dr. Celine Ahr unfortunately it sounds as if they did not receive the referral we contacted them today they should be get in touch with the patient. Depending on how things show from that  standpoint the patient may need to see Dr. Ardyth Gal who is the infectious disease specialist although he really does not want a PICC line again. No fevers, chills, nausea, vomiting, or diarrhea.  He is tolerating hyperbaric oxygen therapy very well. 05/12/2019 on evaluation today patient presents for follow-up visit concerning his right gluteal fold ulcer. He did see Dr. Celine Ahr the surgeon who previously evaluated his wound and she actually felt like he was doing quite well with regard to the wound based on what she was seen. He does seem to be responding to some degree with the oral antibiotics along with hyperbaric oxygen therapy at this point she did not see any evidence or need for further surgical intervention at this point. She recommended deferring additional or ongoing antibiotic therapy to Dr. Steva Ready at her discretion. Fortunately there is no signs of active systemic infection. No fevers, chills, nausea, vomiting, or diarrhea. 10/29; patient I am seeing really for the first time today in terms of his wound. 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 He is being treated with hyperbaric oxygen for underlying osteomyelitis most recently documented by a three-phase bone scan. He has been on Levaquin for roughly a month and he is out of these and asked me for a refill. Most recent cultures were negative although I do not think these were bone cultures. He has a very irregular surface to this wound which is almost nodular in texture. It undermines superiorly with the same nodular hyper granulated surface. I see that he was referred to general surgery for an operative debridement although the surgeon did not agree with this I think that would have been the proper course of action in this. He has been using Hydrofera Blue. He is supposed to start a wound VAC tomorrow which is actually a reapplication of wound VAC. I think mostly last time they had trouble keeping the  seal in place I hope we have better look at this this time. XZAVYER, SAPONE ([id number]) 05/24/2019 on evaluation today I did review patient's note from Dr. Dellia Nims where he was seen last week when I was out of the office. Subsequently it does appear that Dr. Dellia Nims was in agreement that the patient really needed debridement to clear away some of the nodular tissue in the base of the wound. The good news is he has the wound VAC initiated and some of this tissue is already starting to breakdown under the treatment of the wound VAC again because it is not really viable. Nonetheless I do believe that we can likely perform some debridement today to clear this away and hopefully the continuation of the wound VAC along with hyperbarics and antibiotic therapy will be beneficial in stopping this from reoccurring. If we get better control in the wound bed in a good spot I think we can definitely use the PriMatrix which we actually did get approval for. 05/31/2019 on evaluation today patient appears to be doing actually pretty well at this point with regard to his wound. He has been tolerating the dressing changes which is good news. Fortunately there is no signs of infection. He still has some of the hyper granular nodules noted we have not really cleared all these away and I think we can try to do a little bit more that today. I did do quite a bit last week I am hoping to get down to the base of the wound so though actually be able to heal more appropriately. I think the wound VAC is doing a good job right now with controlling the drainage and overall the patient is happy with that. 06/07/2019 upon evaluation today patient appears to be doing  quite well compared to last week with regard to the hyper granulation in the base of the wound. He has been tolerating the dressing changes without complication. Fortunately there is no signs of active infection at this time. With that being said we have not had  any recent measures as far as blood work is concerned I think we may want to check a few things including a CBC, sed rate, and C-reactive protein. Patient is in agreement with that we can try to get that scheduled for him for this Friday. 06/13/2019 on evaluation today patient actually appears to be doing much better at this point based on what I am seeing with regard to his wound. He has much less in the way of hyper granular tissue which is good news and a lot more healing that is taken place since I last saw him as far as the amount of space within the wound itself. With that being said he is nearing the completion of the initial 40 treatments for hyperbaric oxygen therapy I think this is something I would recommend extending as he does seem to be benefiting at this time. Fortunately there is no evidence of active systemic infection at this point nor even local infection for that matter. 06/20/2019 upon evaluation today patient actually appears to be doing well with regard to his wound. In fact this appears to be doing much better today compared to what it was previous. I been extremely pleased with the progress that he is made. In fact the tunnel is much less deep today than what it was in the past and I am seeing excellent signs of improvement. Overall I am happy with how things are going. I did review his white blood cell count which revealed everything to be okay. The one abnormal finding on his CBC was a hemoglobin of 12.7. Subsequently I did also review his sed rate and C-reactive protein. His sed rate measured in at normal level measuring at 26. The C-reactive protein however was elevated today and an abnormal range indicating inflammation. Still I feel like he is trending in a good direction at this time. He is going to need a refill of the Levaquin he tells me at this point. 07/04/2019 on evaluation today patient actually appears to be doing well in regard to his wound. He has been  tolerating the dressing changes without complication. Fortunately there is no signs of active infection which is good news. No fevers, chills, nausea, vomiting, or diarrhea. The good news also is that I did review his labs and though his sed rate was slightly elevated the C-reactive protein was actually down compared to the last evaluation 2 weeks ago this is excellent news I think it is showing signs of improvement. 07/11/2019 on evaluation today patient actually appears to be doing quite well with regard to his wound on my evaluation. This is very slow going but nonetheless he seems to be making progress. Especially in regard to the depth. Nonetheless I believe that we may want to consider going forward with the PriMatrix which was previously approved for him. We will get a have to get a new approval as we will actually be applying this in the new year so we will likely obtain that approval on January 4. In the meantime and for the time being my suggestion is going to be that we go ahead and continue with the wound VAC and we were going at this point with the current wound care measures. 12/28-Patient  seen after HBO session today, wound depth with the tunnel measures 3.2 cm, the wound bed appears healthy, continuing with the VAC and HBO 07/25/2019 on evaluation today patient's wound appears to be doing about the same compared to my last evaluation. Fortunately there is no evidence of significant infection which is good news we will still waiting on the results of his lab work which unfortunately was not done prior to today's visit which is what we were aiming for. Ultimately there is no sign of active infection at this time which is good news. And he also tells me that the wound VAC has been staying in place and doing fairly well which is also good news. With that being said I am concerned about the fact that the wound has not progressed as much RC, SCHIRALDI. ([id number]) I think we may want  to look into getting reapproval for the skin substitute, specifically PriMatrix, that I am hopeful will help to allow this area to granulate in more effectively. I would recommend as well continuing with the wound VAC along with this. 08/01/2019 on evaluation today patient appears to be doing in my opinion slightly better in regard to the wound. He actually does have some tightening of the walls around the edges which is a good thing. Fortunately there is no signs of active infection at this time. No fever chills noted. I do believe that the patient in general seems to be making progress although is very slow. Obviously were trying to get things to speed up somewhat more. No fevers, chills, nausea, vomiting, or diarrhea. 08/08/2019 upon evaluation today patient appears to be doing well with regard to his wound although there is not a significant improvement overall we do have the PriMatrix for availability today to apply. Fortunately there is no signs of active infection and the patient overall seems to be doing well. There is really no need for aggressive sharp debridement at this point either which is also good news. I do believe her get a need to suture and the primary checks in the basin in the PriMatrix in order to keep it in place at the base of the wound underneath the wound VAC. This was discussed with the patient today as well. He is in agreement the plan. He really does not have any filling in the area and so this should not be a complication as far as suturing is concerned. 08/15/2019 on evaluation today patient appears to be doing somewhat better in my opinion in regard to the overall appearance of his wound. He has been tolerating the dressing changes without complication. Fortunately the skin subappear to still be in place when this was changed on Friday. With that being said it appears that the PriMatrix may have dissolved in the interim between now and then as the sutures are no longer  holding anything in place and again I would sutured it into place. Nonetheless I believe that we can go ahead and likely consider going forward that we may need to apply the PriMatrix weekly as opposed every other week as it does seem to have dissolved rather readily and well. Overall the wound bed appears to be doing excellent at this time. 08/22/2019 upon evaluation today the patient does seem to making some progress with regard to his wound. Overall there is some better quality tissue and less of the hyper granular protrusions all of which is good news. With that being said we do have the PriMatrix available for application today  and I am in a try to find a better way to suture this in place for him today. 08/29/2019 on evaluation today patient appears to be doing very well in regard to his wound. I do see signs of new granulation which seems to be somewhat helpful he did have some need for sharp debridement today to clear some of the hyper granulation away but nonetheless I feel like we are headed in a good direction. Fortunately there is no evidence of any systemic infection although we are going to go ahead and reorder the CBC, C-reactive protein, and sed rate today to see where things stand I did just refill his Levaquin as well. 09/05/2019 upon evaluation today patient's wound actually is not appearing to be showing signs of significant improvement compared to what we have seen previous from the standpoint of depth. He overall wound size wise seems to be about the same and again some of the quality of the wound bed is still doing fairly well. I did review his labs and his CBC was normal with no signs of elevation white blood cell count, his C-reactive protein was normal, and the sed rate was slightly elevated but again I think this may just be due to ulceration really there is nothing significant to indicate severe infection. I think when he likely finishes his current round of oral antibiotics  with Levaquin will likely take him off of the medication at that point based on the findings. I do feel like the hyperbarics helped in getting the infection under control as well as improving the overall size of the wound quite significantly. With that being said at this point I really feel like more more at the time frame that he may benefit from a muscle flap in order to try to help this wound heal effectively. I think that this would be the fastest way in order to do this. In the past he has been somewhat resistant to going down that path but nonetheless I feel like he likely should. Objective Constitutional Well-nourished and well-hydrated in no acute distress. Vitals Time Taken: 8:13 AM, Height: 73 in, Weight: 210 lbs, BMI: 27.7, Temperature: 98.1 F, Pulse: 79 bpm, Respiratory [name], [name] Rate: 16 breaths/min, Blood Pressure: 121/74 mmHg. Respiratory normal breathing without difficulty. Psychiatric this patient is able to make decisions and demonstrates good insight into disease process. Alert and Oriented x 3. pleasant and cooperative. General Notes: Patient's wound bed again did not appear to show any signs of infection he did still have some hyper granular budding more on the medial portion of the wound but again at this time I really do not see any signs of infection at this point and though the base of the wound seems to be doing well depth wise I really do not feel like you are making much progress here unfortunately despite the PriMatrix that we were utilizing. Integumentary (Hair, Skin) Wound #1 status is Open. Original cause of wound was Pressure Injury. The wound is located on the Right Gluteal fold. The wound measures 2.5cm length x 1.8cm width x 3.3cm depth; 3.534cm^2 area and 11.663cm^3 volume. There is Fat Layer (Subcutaneous Tissue) Exposed exposed. There is no undermining noted, however, there is tunneling at 12:00 with a maximum distance of 4.4cm.  There is a large amount of serous drainage noted. The wound margin is epibole. There is large (67-100%) pink, hyper - granulation within the wound bed. There is a small (1-33%) amount of necrotic tissue within the  wound bed including Adherent Slough. 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 - in office Cleanse wound with mild soap and water Skin Barriers/Peri-Wound Care: Skin Prep Primary Wound Dressing: Wound #1 Right Gluteal fold: Saline moistened gauze - in clinic only Secondary Dressing: Wound #1 Right Gluteal fold: Boardered Foam Dressing - In Clinic ISAACK, PERSINGER ([id number]) Dressing Change Frequency: Wound #1 Right Gluteal fold: Change Dressing Monday, Wednesday, Friday Follow-up Appointments: Wound #1 Right Gluteal fold: Return Appointment in 1 week. Off-Loading: Wound #1 Right Gluteal fold: Turn and reposition every 2 hours - no pressure on wounded area Home Health: Wound #1 Right Gluteal fold: Continue Home Health Visits - [place] Nurse may visit PRN to address patient s wound care needs. FACE TO FACE ENCOUNTER: MEDICARE and MEDICAID PATIENTS: I certify that this patient is under my care and that I had a face-to-face encounter that meets the physician face-to-face encounter requirements with this patient on this date. The encounter with the patient was in whole or in part for the following MEDICAL CONDITION: (primary reason for [place]) MEDICAL NECESSITY: I certify, that based on my findings, NURSING services are a medically necessary home health service. HOME BOUND STATUS: I certify that my clinical findings support that this patient  is homebound (i.e., Due to illness or injury, pt requires aid of supportive devices such as crutches, cane, wheelchairs, walkers, the use of special transportation or the assistance of another person to leave their place of residence. There is a normal inability to leave the home and doing so requires considerable and taxing effort. Other absences are for medical reasons / religious services and are infrequent or of short duration when for other reasons). If current dressing causes regression in wound condition, may D/C ordered dressing product/s and apply Normal Saline Moist Dressing daily until next [place] / Other MD appointment. [place] of regression in wound condition at [phone]. Please direct any NON-WOUND related issues/requests for orders to patient's Primary Care Physician Negative Pressure Wound Therapy: Wound #1 Right Gluteal fold: Wound VAC settings at 125/130 mmHg continuous pressure. Use BLACK/GREEN foam to wound cavity. Use WHITE foam to fill any tunnel/s and/or undermining. Change VAC dressing 3 X WEEK. Change canister as indicated when full. Nurse may titrate settings and frequency of dressing changes as clinically indicated. - Apply wound vac as normal Home Health Nurse may d/c VAC for s/s of increased infection, significant wound regression, or uncontrolled drainage. [place] at [phone]. Apply contact layer over base of wound. - contact layer over sutured Primatrix in tunnel at 12:00. Number of foam/gauze pieces used in the dressing = Medications-please add to medication list.: Wound #1 Right Gluteal fold: P.O. Antibiotics - Complete antibiotics. Consults ordered were: Plastic Surgery - [facility] for consult regarding flap. 1. I had a long discussion with the patient today concerning a surgical referral to [facility] plastic surgery for evaluation of a muscle flap to close this wound. I explained that I feel like  that may be his best option to try to get this healed as quickly as possible and I really feel like that he has been doing this more than long enough I know he is very frustrated and again I would like  to see him healed as soon as possible as well. Subsequently he and his wife did agree to [place] making the referral and obviously he can discuss with the surgeon more specifically the way surgery recovery with 10 to go. We had many patients that we have sent to [facility] regarding muscle flap that has done extremely well he is not a smoker and I think he is an excellent candidate in this regard. He also appears to have no evidence of any infection at this point which is also good news. 2. In the meantime he does continue to still have a lot of drainage from the wound I do feel like that he likely would benefit from a continuation of the wound VAC and that will be continued at this point. 3. I am also going to suggest that he continue with appropriate offloading he tells me he spends about 22 hours of his day in bed not putting pressure on the wound. Again I think he is doing everything that he needs to do and while he is doing this still were not seeing the results we want to see. For that reason I think that the muscle flap may be his best option especially if he spending that much time in the bed already anyway. DRAEGAN, CARRARA ([id number]) We will see patient back for reevaluation in 1 week here in the clinic. If anything worsens or changes patient will contact our office for additional recommendations. Electronic Signature(s) Signed: 09/05/2019 4:02:37 PM By: Brett Keeler [name] Entered By: [name] on 09/05/2019 16:02:36 DARAN, LAWHEAD ([id number]) -------------------------------------------------------------------------------- SuperBill Details Patient Name: [name] Date of Service: 09/05/2019 Medical Record Number: [id number] Patient Account Number:  [id number] Date of Birth/Sex: 01/13/1951 (68 y.o. 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

## 2019-09-05 NOTE — Progress Notes (Signed)
[name], [name] ([id number]) Visit Report for 09/05/2019 Arrival Information Details Patient Name: [name], [name] Date of Service: 09/05/2019 8:00 AM Medical Record Number: [id number] Patient Account Number: [id number] Date of Birth/Sex: [birth year] (68 y.o. M) Treating RN: Montey Hora Primary Care Gillian Meeuwsen: [name] Other Clinician: Referring Derriona Branscom: [name] Treating [name]/Extender: [name], HOYT Weeks in Treatment: 53 Visit Information History Since Last Visit Added or deleted any medications: No Patient Arrived: Wheel Chair Any new allergies or adverse reactions: No Arrival Time: 08:09 Had a fall or experienced change in No Accompanied By: wife activities of daily living that may affect Transfer Assistance: None risk of falls: Patient Identification Verified: Yes Signs or symptoms of abuse/neglect since last visito No Secondary Verification Process Completed: Yes Hospitalized since last visit: No Patient Requires Transmission-Based No Implantable device outside of the clinic excluding No Precautions: cellular tissue based products placed in the center Patient Has Alerts: Yes since last visit: Patient Alerts: NOT Has Dressing in Place as Prescribed: Yes Diabetic Pain Present Now: No Electronic Signature(s) Signed: 09/05/2019 4:29:13 PM By: Montey Hora Entered By: Montey Hora on 09/05/2019 08:12:34 [name] ([id number]) -------------------------------------------------------------------------------- Clinic Level of Care Assessment Details Patient Name: [name] Date of Service: 09/05/2019 8:00 AM Medical Record Number: [id number] Patient Account Number: [id number] Date of Birth/Sex: [birth year] (68 y.o. M) Treating RN: [name] Primary Care Corina Stacy: [name] Other Clinician: Referring Lillee Mooneyhan: [name] Treating Emilie Carp/Extender: [name], HOYT Weeks in Treatment: 53 Clinic Level of Care  Assessment Items TOOL 4 Quantity Score []  - Use when only an EandM is performed on FOLLOW-UP visit 0 ASSESSMENTS - Nursing Assessment / Reassessment X - Reassessment of Co-morbidities (includes updates in patient status) 1 10 X- 1 5 Reassessment of Adherence to Treatment Plan ASSESSMENTS - Wound and Skin Assessment / Reassessment X - Simple Wound Assessment / Reassessment - one wound 1 5 []  - 0 Complex Wound Assessment / Reassessment - multiple wounds []  - 0 Dermatologic / Skin Assessment (not related to wound area) ASSESSMENTS - Focused Assessment []  - Circumferential Edema Measurements - multi extremities 0 []  - 0 Nutritional Assessment / Counseling / Intervention []  - 0 Lower Extremity Assessment (monofilament, tuning fork, pulses) []  - 0 Peripheral Arterial Disease Assessment (using hand held doppler) ASSESSMENTS - Ostomy and/or Continence Assessment and Care []  - Incontinence Assessment and Management 0 []  - 0 Ostomy Care Assessment and Management (repouching, etc.) PROCESS - Coordination of Care X - Simple Patient / Family Education for ongoing care 1 15 []  - 0 Complex (extensive) Patient / Family Education for ongoing care X- 1 10 Staff obtains Programmer, systems, Records, Test Results / Process Orders []  - 0 Staff telephones HHA, Nursing Homes / Clarify orders / etc []  - 0 Routine Transfer to another Facility (non-emergent condition) []  - 0 Routine Hospital Admission (non-emergent condition) []  - 0 New Admissions / Biomedical engineer / Ordering NPWT, Apligraf, etc. []  - 0 Emergency Hospital Admission (emergent condition) X- 1 10 Simple Discharge Coordination [name], [name] ([id number]) []  - 0 Complex (extensive) Discharge Coordination PROCESS - Special Needs []  - Pediatric / Minor Patient Management 0 []  - 0 Isolation Patient Management []  - 0 Hearing / Language / Visual special needs []  - 0 Assessment of Community assistance (transportation, D/C  planning, etc.) []  - 0 Additional assistance / Altered mentation []  - 0 Support Surface(s) Assessment (bed, cushion, seat, etc.) INTERVENTIONS - Wound Cleansing / Measurement X - Simple Wound Cleansing - one wound 1 5 []  -  0 Complex Wound Cleansing - multiple wounds X- 1 5 Wound Imaging (photographs - any number of wounds) []  - 0 Wound Tracing (instead of photographs) []  - 0 Simple Wound Measurement - one wound []  - 0 Complex Wound Measurement - multiple wounds INTERVENTIONS - Wound Dressings []  - Small Wound Dressing one or multiple wounds 0 X- 1 15 Medium Wound Dressing one or multiple wounds []  - 0 Large Wound Dressing one or multiple wounds []  - 0 Application of Medications - topical []  - 0 Application of Medications - injection INTERVENTIONS - Miscellaneous []  - External ear exam 0 []  - 0 Specimen Collection (cultures, biopsies, blood, body fluids, etc.) []  - 0 Specimen(s) / Culture(s) sent or taken to Lab for analysis []  - 0 Patient Transfer (multiple staff / Civil Service fast streamer / Similar devices) []  - 0 Simple Staple / Suture removal (25 or less) []  - 0 Complex Staple / Suture removal (26 or more) []  - 0 Hypo / Hyperglycemic Management (close monitor of Blood Glucose) []  - 0 Ankle / Brachial Index (ABI) - do not check if billed separately X- 1 5 Vital Signs [name], [name] Has the patient been seen at the hospital within the last three years: Yes Total Score: 85 Level Of Care: New/Established - Level 3 Electronic Signature(s) Signed: 09/05/2019 5:08:08 PM By: Gretta Cool, BSN, RN, CWS, Kim RN, BSN Entered By: Gretta Cool, BSN, RN, CWS, Kim on 09/05/2019 08:43:08 [name], [name] ([id number]) -------------------------------------------------------------------------------- Encounter Discharge Information Details Patient Name: [name] Date of Service: 09/05/2019 8:00 AM Medical Record Number: [id number] Patient Account Number: [id number] Date of  Birth/Sex: 15-Mar-1951 (68 y.o. M) Treating RN: [name] Primary Care Lazer Wollard: [name] Other Clinician: Referring Shanon Becvar: [name] Treating Frenchie Dangerfield/Extender: [name], HOYT Weeks in Treatment: 48 Encounter Discharge Information Items Discharge Condition: Stable Ambulatory Status: Wheelchair Discharge Destination: Home Transportation: Private Auto Accompanied By: self Schedule Follow-up Appointment: Yes Clinical Summary of Care: Electronic Signature(s) Signed: 09/05/2019 5:08:08 PM By: Gretta Cool, BSN, RN, CWS, Kim RN, BSN Entered By: Gretta Cool, BSN, RN, CWS, Kim on 09/05/2019 08:45:28 [name] ([id number]) -------------------------------------------------------------------------------- Lower Extremity Assessment Details Patient Name: [name] Date of Service: 09/05/2019 8:00 AM Medical Record Number: [id number] Patient Account Number: [id number] Date of Birth/Sex: 10-Aug-1950 (68 y.o. M) Treating RN: Montey Hora Primary Care Castiel Lauricella: [name] Other Clinician: Referring Siboney Requejo: [name] Treating Vayla Wilhelmi/Extender: [name], HOYT Weeks in Treatment: 78 Electronic Signature(s) Signed: 09/05/2019 4:29:13 PM By: Montey Hora Entered By: Montey Hora on 09/05/2019 08:13:26 [name], [name] ([id number]) -------------------------------------------------------------------------------- Multi Wound Chart Details Patient Name: [name] Date of Service: 09/05/2019 8:00 AM Medical Record Number: [id number] Patient Account Number: [id number] Date of Birth/Sex: Jul 08, 1951 (68 y.o. M) Treating RN: [name] Primary Care Versa Craton: [name] Other Clinician: Referring Bassheva Flury: [name] Treating Jernie Schutt/Extender: [name], HOYT Weeks in Treatment: 92 Vital Signs Height(in): 73 Pulse(bpm): 79 Weight(lbs): 210 Blood Pressure(mmHg): 121/74 Body Mass Index(BMI): 28 Temperature(F): 98.1 Respiratory  Rate 16 (breaths/min): Photos: [N/A:N/A] Wound Location: Right Gluteal fold N/A N/A Wounding Event: Pressure Injury N/A N/A Primary Etiology: Pressure Ulcer N/A N/A Comorbid History: Cataracts, Hypertension, N/A N/A Paraplegia Date Acquired: 11/02/2017 N/A N/A Weeks of Treatment: 92 N/A N/A Wound Status: Open N/A N/A Measurements L x W x D 2.5x1.8x3.3 N/A N/A (cm) Area (cm) : 3.534 N/A N/A Volume (cm) : 11.663 N/A N/A % Reduction in Area: 64.80% N/A N/A % Reduction in Volume: -1060.50% N/A N/A Position 1 (o'clock): 12 Maximum Distance 1 (  cm): 4.4 Tunneling: Yes N/A N/A Classification: Category/Stage IV N/A N/A Exudate Amount: Large N/A N/A Exudate Type: Serous N/A N/A Exudate Color: amber N/A N/A Wound Margin: Epibole N/A N/A Granulation Amount: Large (67-100%) N/A N/A Granulation Quality: Pink, Hyper-granulation N/A N/A Necrotic Amount: Small (1-33%) N/A N/A Exposed Structures: Fat Layer (Subcutaneous N/A N/A Tissue) Exposed: Yes Fascia: No Tendon: No [name], [name] ([id number]) Muscle: No Joint: No Bone: No Epithelialization: Small (1-33%) N/A N/A Treatment Notes Electronic Signature(s) Signed: 09/05/2019 5:08:08 PM By: Gretta Cool, BSN, RN, CWS, Kim RN, BSN Entered By: Gretta Cool, BSN, RN, CWS, Kim on 09/05/2019 08:34:35 [name], [name] ([id number]) -------------------------------------------------------------------------------- [place] Details Patient Name: [name] Date of Service: 09/05/2019 8:00 AM Medical Record Number: [id number] Patient Account Number: [id number] Date of Birth/Sex: August 16, 1950 (68 y.o. M) Treating RN: [name] Primary Care Okie Jansson: [name] Other Clinician: Referring Melisse Caetano: [name] Treating Monseratt Ledin/Extender: [name], HOYT Weeks in Treatment: 22 Active Inactive Orientation to the Wound Care Program Nursing Diagnoses: Knowledge deficit related to the wound healing center  program Goals: Patient/caregiver will verbalize understanding of the [place] Program Date Initiated: 11/30/2017 Target Resolution Date: 12/21/2017 Goal Status: Active Interventions: Provide education on orientation to the wound center Notes: Osteomyelitis Nursing Diagnoses: Infection: osteomyelitis Goals: Patient's osteomyelitis will resolve Date Initiated: 08/29/2019 Target Resolution Date: 09/28/2019 Goal Status: Active Signs and symptoms for osteomyelitis will be recognized and promptly addressed Date Initiated: 08/29/2019 Target Resolution Date: 09/28/2019 Goal Status: Active Interventions: Assess for signs and symptoms of osteomyelitis resolution every visit Treatment Activities: Systemic antibiotics : 08/29/2019 Notes: Pressure Nursing Diagnoses: Knowledge deficit related to management of pressures ulcers Goals: [name], [name] ([id number]) Patient/caregiver will verbalize understanding of pressure ulcer management Date Initiated: 05/17/2018 Target Resolution Date: 05/28/2018 Goal Status: Active Interventions: Assess: immobility, friction, shearing, incontinence upon admission and as needed Notes: Wound/Skin Impairment Nursing Diagnoses: Impaired tissue integrity Goals: Patient/caregiver will verbalize understanding of skin care regimen Date Initiated: 11/30/2017 Target Resolution Date: 12/21/2017 Goal Status: Active Ulcer/skin breakdown will have a volume reduction of 30% by week 4 Date Initiated: 11/30/2017 Target Resolution Date: 12/21/2017 Goal Status: Active Interventions: Assess patient/caregiver ability to obtain necessary supplies Assess patient/caregiver ability to perform ulcer/skin care regimen upon admission and as needed Assess ulceration(s) every visit Treatment Activities: Skin care regimen initiated : 11/30/2017 Notes: Electronic Signature(s) Signed: 09/05/2019 5:08:08 PM By: Gretta Cool, BSN, RN, CWS, Kim RN, BSN Entered By: Gretta Cool, BSN, RN,  CWS, Kim on 09/05/2019 08:34:19 [name], [name] ([id number]) -------------------------------------------------------------------------------- Pain Assessment Details Patient Name: [name] Date of Service: 09/05/2019 8:00 AM Medical Record Number: [id number] Patient Account Number: [id number] Date of Birth/Sex: May 11, 1951 (68 y.o. M) Treating RN: Montey Hora Primary Care Norma Ignasiak: [name] Other Clinician: Referring Geraldyn Shain: [name] Treating Cacie Gaskins/Extender: [name], HOYT Weeks in Treatment: 52 Active Problems Location of Pain Severity and Description of Pain Patient Has Paino No Site Locations Pain Management and Medication Current Pain Management: Notes hurts only when sitting Electronic Signature(s) Signed: 09/05/2019 4:29:13 PM By: Montey Hora Entered By: Montey Hora on 09/05/2019 08:13:00 [name] ([id number]) -------------------------------------------------------------------------------- Patient/Caregiver Education Details Patient Name: [name] Date of Service: 09/05/2019 8:00 AM Medical Record Number: [id number] Patient Account Number: [id number] Date of Birth/Gender: [birth year] (68 y.o. M) Treating RN: [name] Primary Care Physician: [name] Other Clinician: Referring Physician: [name] Treating Physician/Extender: Sharalyn Ink in Treatment: 80 Education Assessment Education Provided To: Patient and Caregiver Education Topics Provided Infection: Handouts: Infection  Prevention and Management Methods: Demonstration, Explain/Verbal Wound/Skin Impairment: Handouts: Caring for Your Ulcer, Other: Continue wound vac as ordered Methods: Demonstration, Explain/Verbal Responses: State content correctly Electronic Signature(s) Signed: 09/05/2019 5:08:08 PM By: Gretta Cool, BSN, RN, CWS, Kim RN, BSN Entered By: Gretta Cool, BSN, RN, CWS, Kim on 09/05/2019 08:43:57 [name], [name]  ([id number]) -------------------------------------------------------------------------------- Wound Assessment Details Patient Name: [name] Date of Service: 09/05/2019 8:00 AM Medical Record Number: [id number] Patient Account Number: [id number] Date of Birth/Sex: [birth year] (68 y.o. M) Treating RN: Montey Hora Primary Care Michaelina Blandino: [name] Other Clinician: Referring Juliah Scadden: [name] Treating Anthonymichael Munday/Extender: [name], HOYT Weeks in Treatment: 92 Wound Status Wound Number: 1 Primary Etiology: Pressure Ulcer Wound Location: Right Gluteal fold Wound Status: Open Wounding Event: Pressure Injury Comorbid History: Cataracts, Hypertension, Paraplegia Date Acquired: 11/02/2017 Weeks Of Treatment: 92 Clustered Wound: No Photos Wound Measurements Length: (cm) 2.5 % Reductio Width: (cm) 1.8 % Reductio Depth: (cm) 3.3 Epithelial Area: (cm) 3.534 Tunneling Volume: (cm) 11.663 Positi Maximum n in Area: 64.8% n in Volume: -1060.5% ization: Small (1-33%) : Yes on (o'clock): 12 Distance: (cm) 4.4 Undermining: No Wound Description Classification: Category/Stage IV Foul Odor Wound Margin: Epibole Slough/Fib Exudate Amount: Large Exudate Type: Serous Exudate Color: amber After Cleansing: No rino Yes Wound Bed Granulation Amount: Large (67-100%) Exposed Structure Granulation Quality: Pink, Hyper-granulation Fascia Exposed: No Necrotic Amount: Small (1-33%) Fat Layer (Subcutaneous Tissue) Exposed: Yes Necrotic Quality: Adherent Slough Tendon Exposed: No Muscle Exposed: No Joint Exposed: No Bone Exposed: No [name], [name] J. ([id number]) Treatment Notes Wound #1 (Right Gluteal fold) Notes Saline moistened gauze, bordered foam dressing, HH to replace wound vac later today. Electronic Signature(s) Signed: 09/05/2019 4:29:13 PM By: Montey Hora Entered By: Montey Hora on 09/05/2019 08:22:42 [name]  ([id number]) -------------------------------------------------------------------------------- [place] Details Patient Name: [name] Date of Service: 09/05/2019 8:00 AM Medical Record Number: [id number] Patient Account Number: [id number] Date of Birth/Sex: 1951-04-13 (68 y.o. M) Treating RN: Montey Hora Primary Care Janessa Mickle: [name] Other Clinician: Referring Haydn Cush: [name] Treating Courtny Bennison/Extender: [name], HOYT Weeks in Treatment: 92 Vital Signs Time Taken: 08:13 Temperature (F): 98.1 Height (in): 73 Pulse (bpm): 79 Weight (lbs): 210 Respiratory Rate (breaths/min): 16 Body Mass Index (BMI): 27.7 Blood Pressure (mmHg): 121/74 Reference Range: 80 - 120 mg / dl Electronic Signature(s) Signed: 09/05/2019 4:29:13 PM By: Montey Hora Entered By: Montey Hora on 09/05/2019 08:13:16

## 2019-09-12 ENCOUNTER — Other Ambulatory Visit: Payer: Self-pay

## 2019-09-12 ENCOUNTER — Encounter: Payer: Medicare Other | Admitting: Physician Assistant

## 2019-09-12 DIAGNOSIS — L89314 Pressure ulcer of right buttock, stage 4: Secondary | ICD-10-CM | POA: Diagnosis not present

## 2019-09-19 ENCOUNTER — Other Ambulatory Visit: Payer: Self-pay

## 2019-09-19 ENCOUNTER — Encounter: Payer: Medicare Other | Attending: Physician Assistant | Admitting: Physician Assistant

## 2019-09-19 DIAGNOSIS — Z8546 Personal history of malignant neoplasm of prostate: Secondary | ICD-10-CM | POA: Diagnosis not present

## 2019-09-19 DIAGNOSIS — Z880 Allergy status to penicillin: Secondary | ICD-10-CM | POA: Insufficient documentation

## 2019-09-19 DIAGNOSIS — M8668 Other chronic osteomyelitis, other site: Secondary | ICD-10-CM | POA: Diagnosis not present

## 2019-09-19 DIAGNOSIS — G8220 Paraplegia, unspecified: Secondary | ICD-10-CM | POA: Insufficient documentation

## 2019-09-19 DIAGNOSIS — Z8249 Family history of ischemic heart disease and other diseases of the circulatory system: Secondary | ICD-10-CM | POA: Diagnosis not present

## 2019-09-19 DIAGNOSIS — Z823 Family history of stroke: Secondary | ICD-10-CM | POA: Diagnosis not present

## 2019-09-19 DIAGNOSIS — L03317 Cellulitis of buttock: Secondary | ICD-10-CM | POA: Insufficient documentation

## 2019-09-19 DIAGNOSIS — I10 Essential (primary) hypertension: Secondary | ICD-10-CM | POA: Insufficient documentation

## 2019-09-19 DIAGNOSIS — Z881 Allergy status to other antibiotic agents status: Secondary | ICD-10-CM | POA: Diagnosis not present

## 2019-09-19 DIAGNOSIS — L89314 Pressure ulcer of right buttock, stage 4: Secondary | ICD-10-CM | POA: Insufficient documentation

## 2019-09-19 DIAGNOSIS — I252 Old myocardial infarction: Secondary | ICD-10-CM | POA: Diagnosis not present

## 2019-09-19 NOTE — Progress Notes (Signed)
SALVATOR, MUHL ([id number]) Visit Report for 09/19/2019 Arrival Information Details Patient Name: [name], [name] Date of Service: 09/19/2019 8:00 AM Medical Record Number: [id number] Patient Account Number: [id number] Date of Birth/Sex: [birth year] (69 y.o. M) Treating RN: Primary Care Kelley Knoth: [name] Other Clinician: Referring Jaser Fullen: [name] Treating Kindred Reidinger/Extender: [name], HOYT Weeks in Treatment: 75 Visit Information History Since Last Visit Added or deleted any medications: No Patient Arrived: Wheel Chair Any new allergies or adverse reactions: No Arrival Time: 08:12 Had a fall or experienced change in No Accompanied By: wife activities of daily living that may affect Transfer Assistance: None risk of falls: Patient Requires Transmission-Based Precautions: No Signs or symptoms of abuse/neglect since last visito No Patient Has Alerts: Yes Hospitalized since last visit: No Patient Alerts: NOT Diabetic Implantable device outside of the clinic excluding No cellular tissue based products placed in the center since last visit: Has Dressing in Place as Prescribed: Yes Pain Present Now: Yes Electronic Signature(s) Signed: 09/19/2019 11:27:56 AM By: Lorine Bears RCP, RRT, CHT Entered By: Lorine Bears on 09/19/2019 08:12:50 [name], [name] ([id number]) -------------------------------------------------------------------------------- Encounter Discharge Information Details Patient Name: [name] Date of Service: 09/19/2019 8:00 AM Medical Record Number: [id number] Patient Account Number: [id number] Date of Birth/Sex: 07/25/1950 (69 y.o. M) Treating RN: Army Melia Primary Care Marliyah Reid: [name] Other Clinician: Referring Ariadne Rissmiller: [name] Treating Mickie Kozikowski/Extender: [name], HOYT Weeks in Treatment: 77 Encounter Discharge Information Items Post Procedure Vitals Discharge Condition:  Stable Temperature (F): 98.6 Ambulatory Status: Wheelchair Pulse (bpm): 82 Discharge Destination: Home Respiratory Rate (breaths/min): 16 Transportation: Private Auto Blood Pressure (mmHg): 136/81 Accompanied By: wife Schedule Follow-up Appointment: Yes Clinical Summary of Care: Electronic Signature(s) Signed: 09/19/2019 11:44:39 AM By: Army Melia Entered By: Army Melia on 09/19/2019 08:38:13 [name] ([id number]) -------------------------------------------------------------------------------- Lower Extremity Assessment Details Patient Name: [name] Date of Service: 09/19/2019 8:00 AM Medical Record Number: [id number] Patient Account Number: [id number] Date of Birth/Sex: 02-20-1951 (69 y.o. M) Treating RN: Montey Hora Primary Care Takari Lundahl: [name] Other Clinician: Referring Larcenia Holaday: [name] Treating Meagen Limones/Extender: [name], HOYT Weeks in Treatment: 65 Electronic Signature(s) Signed: 09/19/2019 4:41:53 PM By: Montey Hora Entered By: Montey Hora on 09/19/2019 08:16:40 [name], [name] ([id number]) -------------------------------------------------------------------------------- Multi Wound Chart Details Patient Name: [name] Date of Service: 09/19/2019 8:00 AM Medical Record Number: [id number] Patient Account Number: [id number] Date of Birth/Sex: May 30, 1951 (69 y.o. M) Treating RN: Army Melia Primary Care Kyiesha Millward: [name] Other Clinician: Referring Jquan Egelston: [name] Treating Annlee Glandon/Extender: [name], HOYT Weeks in Treatment: 94 Vital Signs Height(in): 73 Pulse(bpm): 82 Weight(lbs): 210 Blood Pressure(mmHg): 136/81 Body Mass Index(BMI): 28 Temperature(F): 98.6 Respiratory Rate(breaths/min): 16 Photos: [N/A:N/A] Wound Location: Right Gluteal fold N/A N/A Wounding Event: Pressure Injury N/A N/A Primary Etiology: Pressure Ulcer N/A N/A Comorbid History: Cataracts,  Hypertension, Paraplegia N/A N/A Date Acquired: 11/02/2017 N/A N/A Weeks of Treatment: 94 N/A N/A Wound Status: Open N/A N/A Measurements L x W x D (cm) 3.2x1.6x2.2 N/A N/A Area (cm) : 4.021 N/A N/A Volume (cm) : 8.847 N/A N/A % Reduction in Area: 60.00% N/A N/A % Reduction in Volume: -780.30% N/A N/A Position 1 (o'clock): 12 Maximum Distance 1 (cm): 4 Tunneling: Yes N/A N/A Classification: Category/Stage IV N/A N/A Exudate Amount: Large N/A N/A Exudate Type: Serous N/A N/A Exudate Color: amber N/A N/A Wound Margin: Epibole N/A N/A Granulation Amount: Large (67-100%) N/A N/A Granulation Quality: Pink, Hyper-granulation N/A N/A Necrotic Amount: Small (1-33%) N/A N/A Exposed Structures:  Fat Layer (Subcutaneous Tissue) N/A N/A Exposed: Yes Fascia: No Tendon: No Muscle: No Joint: No Bone: No Epithelialization: Small (1-33%) N/A N/A Treatment Notes Electronic Signature(s) Signed: 09/19/2019 11:44:39 AM By: Tharon Aquas ([id number]) Entered By: Army Melia on 09/19/2019 08:33:32 [name], [name] ([id number]) -------------------------------------------------------------------------------- [place] Details Patient Name: [name] Date of Service: 09/19/2019 8:00 AM Medical Record Number: [id number] Patient Account Number: [id number] Date of Birth/Sex: Feb 08, 1951 (69 y.o. M) Treating RN: Army Melia Primary Care Jaszmine Navejas: [name] Other Clinician: Referring Ewan Grau: [name] Treating Ruvim Risko/Extender: [name], HOYT Weeks in Treatment: 62 Active Inactive Orientation to the Wound Care Program Nursing Diagnoses: Knowledge deficit related to the wound healing center program Goals: Patient/caregiver will verbalize understanding of the [facility] Program Date Initiated: 11/30/2017 Target Resolution Date: 12/21/2017 Goal Status: Active Interventions: Provide education on orientation to the wound  center Notes: Osteomyelitis Nursing Diagnoses: Infection: osteomyelitis Goals: Patient's osteomyelitis will resolve Date Initiated: 08/29/2019 Target Resolution Date: 09/28/2019 Goal Status: Active Signs and symptoms for osteomyelitis will be recognized and promptly addressed Date Initiated: 08/29/2019 Target Resolution Date: 09/28/2019 Goal Status: Active Interventions: Assess for signs and symptoms of osteomyelitis resolution every visit Treatment Activities: Systemic antibiotics : 08/29/2019 Notes: Pressure Nursing Diagnoses: Knowledge deficit related to management of pressures ulcers Goals: Patient/caregiver will verbalize understanding of pressure ulcer management Date Initiated: 05/17/2018 Target Resolution Date: 05/28/2018 Goal Status: Active Interventions: Assess: immobility, friction, shearing, incontinence upon admission and as needed Notes: [name], [name] ([id number]) Wound/Skin Impairment Nursing Diagnoses: Impaired tissue integrity Goals: Patient/caregiver will verbalize understanding of skin care regimen Date Initiated: 11/30/2017 Target Resolution Date: 12/21/2017 Goal Status: Active Ulcer/skin breakdown will have a volume reduction of 30% by week 4 Date Initiated: 11/30/2017 Target Resolution Date: 12/21/2017 Goal Status: Active Interventions: Assess patient/caregiver ability to obtain necessary supplies Assess patient/caregiver ability to perform ulcer/skin care regimen upon admission and as needed Assess ulceration(s) every visit Treatment Activities: Skin care regimen initiated : 11/30/2017 Notes: Electronic Signature(s) Signed: 09/19/2019 11:44:39 AM By: Army Melia Entered By: Army Melia on 09/19/2019 08:33:24 [name] ([id number]) -------------------------------------------------------------------------------- Pain Assessment Details Patient Name: [name] Date of Service: 09/19/2019 8:00 AM Medical Record Number:  [id number] Patient Account Number: [id number] Date of Birth/Sex: 16-Nov-1950 (69 y.o. M) Treating RN: Montey Hora Primary Care Lielle Vandervort: [name] Other Clinician: Referring Jonella Redditt: [name] Treating Khilee Hendricksen/Extender: [name], HOYT Weeks in Treatment: 37 Active Problems Location of Pain Severity and Description of Pain Patient Has Paino Yes Site Locations Pain Management and Medication Current Pain Management: Notes hurts when sitting Electronic Signature(s) Signed: 09/19/2019 4:41:53 PM By: Montey Hora Entered By: Montey Hora on 09/19/2019 08:17:01 [name] ([id number]) -------------------------------------------------------------------------------- Patient/Caregiver Education Details Patient Name: [name] Date of Service: 09/19/2019 8:00 AM Medical Record Number: [id number] Patient Account Number: [id number] Date of Birth/Gender: [birth year] (69 y.o. M) Treating RN: Army Melia Primary Care Physician: [name] Other Clinician: Referring Physician: [name] Treating Physician/Extender: Sharalyn Ink in Treatment: 54 Education Assessment Education Provided To: Patient Education Topics Provided Wound/Skin Impairment: Handouts: Caring for Your Ulcer Methods: Demonstration, Explain/Verbal Responses: State content correctly Electronic Signature(s) Signed: 09/19/2019 11:44:39 AM By: Army Melia Entered By: Army Melia on 09/19/2019 08:37:28 [name], [name] ([id number]) -------------------------------------------------------------------------------- Wound Assessment Details Patient Name: [name] Date of Service: 09/19/2019 8:00 AM Medical Record Number: [id number] Patient Account Number: [id number] Date of Birth/Sex: [birth year] (69 y.o. M) Treating RN: Montey Hora Primary Care Andersson Larrabee: [name]  Other Clinician: Referring [place]: [name] Treating Remer Couse/Extender:  [name], HOYT Weeks in Treatment: 94 Wound Status Wound Number: 1 Primary Etiology: Pressure Ulcer Wound Location: Right Gluteal fold Wound Status: Open Wounding Event: Pressure Injury Comorbid History: Cataracts, Hypertension, Paraplegia Date Acquired: 11/02/2017 Weeks Of Treatment: 94 Clustered Wound: No Photos Wound Measurements Length: (cm) 3.2 Width: (cm) 1.6 Depth: (cm) 2.2 Area: (cm) 4.021 Volume: (cm) 8.847 % Reduction in Area: 60% % Reduction in Volume: -780.3% Epithelialization: Small (1-33%) Tunneling: Yes Position (o'clock): 12 Maximum Distance: (cm) 4 Undermining: No Wound Description Classification: Category/Stage IV Wound Margin: Epibole Exudate Amount: Large Exudate Type: Serous Exudate Color: amber Foul Odor After Cleansing: No Slough/Fibrino Yes Wound Bed Granulation Amount: Large (67-100%) Exposed Structure Granulation Quality: Pink, Hyper-granulation Fascia Exposed: No Necrotic Amount: Small (1-33%) Fat Layer (Subcutaneous Tissue) Exposed: Yes Necrotic Quality: Adherent Slough Tendon Exposed: No Muscle Exposed: No Joint Exposed: No Bone Exposed: No Treatment Notes Wound #1 (Right Gluteal fold) Notes Saline moistened gauze, xtrasorb, tegaderm, HH to replace wound vac later today. [name], [name] ([id number]) Electronic Signature(s) Signed: 09/19/2019 4:41:53 PM By: Montey Hora Entered By: Montey Hora on 09/19/2019 08:27:32 [name], [name] ([id number]) -------------------------------------------------------------------------------- [place] Details Patient Name: [name] Date of Service: 09/19/2019 8:00 AM Medical Record Number: [id number] Patient Account Number: [id number] Date of Birth/Sex: [birth year] (69 y.o. M) Treating RN: Primary Care Kinya Meine: [name] Other Clinician: Referring Desmen Schoffstall: [name] Treating Mical Brun/Extender: [name], HOYT Weeks in Treatment: 94 Vital Signs Time Taken:  08:10 Temperature (F): 98.6 Height (in): 73 Pulse (bpm): 82 Weight (lbs): 210 Respiratory Rate (breaths/min): 16 Body Mass Index (BMI): 27.7 Blood Pressure (mmHg): 136/81 Reference Range: 80 - 120 mg / dl Electronic Signature(s) Signed: 09/19/2019 11:27:56 AM By: Lorine Bears RCP, RRT, CHT Entered By: Lorine Bears on 09/19/2019 08:13:51

## 2019-09-19 NOTE — Progress Notes (Addendum)
[name] ([id number]) Visit Report for 09/19/2019 Chief Complaint Document Details Patient Name: [name], [name] Date of Service: 09/19/2019 8:00 AM Medical Record Number: [id number] Patient Account Number: [id number] Date of Birth/Sex: [birth year] (68 y.o. M) Treating RN: Primary Care Provider: [name] Other Clinician: Referring Provider: [name] Treating Provider/Extender: [name], Bibi Economos Weeks in Treatment: 19 Information Obtained from: Patient Chief Complaint Right gluteal fold ulcer Electronic Signature(s) Signed: 09/19/2019 8:18:13 AM By: Worthy Keeler [name] Entered By: Worthy Keeler on 09/19/2019 08:18:13 MADDYN, DINH ([id number]) -------------------------------------------------------------------------------- Debridement Details Patient Name: [name] Date of Service: 09/19/2019 8:00 AM Medical Record Number: [id number] Patient Account Number: [id number] Date of Birth/Sex: 03-Nov-1950 (68 y.o. M) Treating RN: Army Melia Primary Care Provider: [name] Other Clinician: Referring Provider: [name] Treating Provider/Extender: [name], Leaner Morici Weeks in Treatment: 94 Debridement Performed for Wound #1 Right Gluteal fold Assessment: Performed By: Physician [name], Ebelyn Bohnet E., [name] Debridement Type: Debridement Level of Consciousness (Pre- Awake and Alert procedure): Pre-procedure Verification/Time Out Yes - 08:33 Taken: Start Time: 08:34 Pain Control: Lidocaine Total Area Debrided (L x W): 3.2 (cm) x 1.6 (cm) = 5.12 (cm) Tissue and other material debrided: Non-Viable, Subcutaneous Level: Skin/Subcutaneous Tissue Debridement Description: Excisional Instrument: Curette Bleeding: Minimum Hemostasis Achieved: Pressure End Time: 08:35 Response to Treatment: Procedure was tolerated well Level of Consciousness (Post- Awake and Alert procedure): Post Debridement Measurements of Total Wound Length: (cm)  3.2 Stage: Category/Stage IV Width: (cm) 1.6 Depth: (cm) 2.2 Volume: (cm) 8.847 Character of Wound/Ulcer Post Debridement: Stable Post Procedure Diagnosis Same as Pre-procedure Electronic Signature(s) Signed: 09/19/2019 11:44:39 AM By: Army Melia Signed: 09/19/2019 4:51:53 PM By: Worthy Keeler [name] Entered By: Army Melia on 09/19/2019 08:35:51 JAROSLAW, HRABE ([id number]) -------------------------------------------------------------------------------- HPI Details Patient Name: [name] Date of Service: 09/19/2019 8:00 AM Medical Record Number: [id number] Patient Account Number: [id number] Date of Birth/Sex: 04/05/1951 (69 y.o. 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 He states that there is no history of diabetes that he is aware of. He does have issues with his prostate and is currently receiving treatment for this by way of oral medication. With that being said I do not have a lot of details in that regard. Nonetheless the patient presents today as a result of having been referred to [place] by another provider initially home health was set to come out and take care of his wound although due to the fact that he apparently drives he's not able to receive home health. His wife is therefore trying to help take care of this wound within although they have been struggling with what exactly to do at this point. She states that she can do some things but she is definitely not a nurse and does have some issues with looking at blood. The good news  is the wound does not appear to be too deep and is fairly superficial at this point. There is no slough noted there is some nonviable skin noted around the surface of the wound and the perimeter at this point. The central portion of the wound appears  to be very good with a dermal layer noted this does not appear to be again deep enough to extend it to subcutaneous tissue at this point. 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 In the pump he has Dilaudid and baclofen. 12/07/17 on evaluation today patient presents for follow-up concerning his ongoing lower back thigh ulcer on the right. He states that he did not get the supplies ordered and therefore has not really been able to perform the dressing changes as directed exactly. His wife was able to get some Boarder Foam Dressing's from the drugstore and subsequently has been using hydrogel which did help to a degree in the wound does appear to be able smaller. There is actually more drainage this week noted than previous. 12/21/17 on evaluation today patient appears to be doing rather well in regard to his right gluteal ulcer. He has been tolerating the dressing changes without complication. There does not appear to be any evidence of infection at this point in time. Overall the wound does seem to be making some progress as far as the edges are concerned there's not as much in the way of overlapping of the external wound edges and he has a good epithelium to wound bed border for the most part. This however is not true right at the 12 o'clock location over the span of a little over a centimeters which actually will require debridement today to clean this away and hopefully allow it to continue to heal more appropriately. 12/28/17 on evaluation today patient appears to be doing rather well in regard to his ulcer in the left gluteal region. He's been  tolerating the dressing changes without complication. Apparently he has had some difficulty getting his dressing material. Apparently there's been some confusion with ordering we're gonna check into this. Nonetheless overall he's been showing signs of improvement which is good news. Debridement is not required today. 01/04/18 on evaluation today patient presents for follow-up concerning his right gluteal ulcer. He has been tolerating the dressing changes fairly well. On inspection today it appears he may actually have some maceration them concerned about the fact that he may be developing too much moisture in and around the wound bed which can cause delay in healing. With that being said he unfortunately really has not showed significant signs of improvement since last week's evaluation in fact this may even be just the little bit/slightly larger. Nonetheless he's been having a lot of discomfort I'm not sure this is even related to the wound as he has no pain when I'm to breeding or otherwise cleaning the wound during evaluation today. Nonetheless this is something that we did recommend he talked to his pain specialist concerning. 01/11/18 on evaluation today patient appears to be doing better in regard to his ulceration. He has been tolerating the dressing changes without complication. With that being said overall there's no evidence of infection which is good news. The only thing is he did receive the hatch affair blue classic versus the ready nonetheless I feel like this is perfectly fine and appears to have done well for him over the past week. 01/25/18 on evaluation today patient's wound actually appears to be a little bit larger than during the last evaluation. The good news is the majority of the wound edges actually appear to be fairly firmly attached to the wound bed unfortunately again we're not really making  progress in regard to the size. Roughly the wound is about the same size as when I  first saw him although again the wound margin/edges appear to be much better. 02/01/18 on evaluation today patient actually appears to be doing very well in regard to his wound. Applying the Prisma dry does seem to be better although he does still have issues with slow progression of the wound. There was a slight improvement compared to last week's measurements today. Nonetheless I have been considering other options as far as the possibility of Theraskin or even a snap vac. In general I'm not sure that the Theraskin due to location of the wound would be a very good idea. Nonetheless I do think that a snap vac could be a possibility for the patient and in fact I think this could even be an excellent way to manage the wound possibly seeing some improvement in a very rapid fashion here. Nonetheless this is something that we would need to get approved and I did have a lengthy conversation with the patient about this today. 02/08/18 on evaluation today patient appears to be doing a little better in regard to his ulcer. He has been tolerating the dressing changes without complication. Fortunately despite the fact that the wound is a little bit smaller it's not significantly so unfortunately. We have discussed the possibility of a snap vac we did check with insurance this is actually covered at this point. Fortunately there does not appear to be any sign of infection. Overall I'm fairly pleased with how things seem to be appearing at this point. 02/15/18 on evaluation today patient appears to be doing rather well in regard to his right gluteal ulcer. Unfortunately the snap vac did not stay in place with his sheer and friction this came loose and did not seem to maintain seal very well. He worked for about two days and it did seem to do very well during that time according to his wife but in general this does not seem to be something that's gonna be beneficial for him long-term. I do believe we ARPAN, ARNWINE. ([id number]) need to go back to standard dressings to see if we can find something that will be of benefit. 03/02/18- He is here in follow up evaluation; there is minimal change in the wound. He will continue with the same treatment plan, would consider changing to iodosrob/iodoflex if ulcer continues to to plateau. He will follow up next week 03/08/18 on evaluation today patient's wound actually appears to be about the same size as when I previously saw him several weeks back. Unfortunately he does have some slightly dark discoloration in the central portion of the wound which has me concerned about pressure injury. I do believe he may be sitting for too long a period of time in fact he tells me that "I probably sit for much too long". He does have some Slough noted on the surface of the wound and again as far as the size of the wound is concerned I'm really not seeing anything that seems to have improved significantly. 03/15/18 on evaluation today patient appears to be doing fairly well in regard to his ulcer. The wound measured pretty much about the same today compared to last week's evaluation when looking at his graph. With that being said the area of bruising/deep tissue injury that was noted last week I do not see at this point. He did get a new cushion fortunately this does seem to be have  been of benefit in my pinion. It does appear that he's been off of this more which is good news as well I think that is definitely showing in the overall wound measurements. With that being said I do believe that he needs to continue to offload I don't think that the fact this is doing better should be or is going to allow him to not have to offload and explain this to him as well. Overall he seems to be in agreement the plan I think he understands. The overall appearance of the wound bed is improved compared to last week I think the Iodoflex has been beneficial in that regard. 03/29/18 on evaluation today  patient actually appears to be doing rather well in regard to his wound from the overall appearance standpoint he does have some granulation although there's some Slough on the surface of the wound noted as well. With that being said he unfortunately has not improved in regard to the overall measurement of the wound in volume or in size. I did have a discussion with him very specifically about offloading today. He actually does work although he mainly is just sitting throughout the day. He tells me he offloads by "lifting himself up for 30 seconds off of his chair occasionally" purchase from [employer] which does seem to have helped. And he has a new cushion that he with that being said he's also able to stand some for a very short period of time but not significant enough I think to provide appropriate offloading. I think the biggest issue at this point with the wound and the fact is not healing as quickly as we would like is due to the fact that he is really not able to appropriately offload while at work. He states the beginning after his injury he actually had a bed at his job that he could lay on in order to offload and that does seem to have been of help back at that time. Nonetheless he had not done this in quite some time unfortunately. I think that could be helpful for him this is something I would like for him to look into. 04/05/18 on evaluation today patient actually presents for follow-up concerning his right gluteal ulcer. Again he really is not significantly improved even compared to last week. He has been tolerating the dressing changes without complication. With that being said fortunately there appears to be no evidence of infection at this time. He has been more proactive in trying to offload. 04/12/18 on evaluation today patient actually appears to be doing a little better in regard to his wound and the right gluteal fold region. He's been tolerating the dressing changes since  removing the oasis without complication. However he was having a lot of burning initially with the oasis in place. He's unsure of exactly why this was given so much discomfort but he assumes that it was the oasis itself causing the problem. Nonetheless this had to be removed after about three days in place although even those three days seem to have made a fairly good improvement in regard to the overall appearance of the wound bed. In fact is the first time that he's made any improvement from the standpoint of measurements in about six weeks. He continues to have no discomfort over the area of the wound itself which leads me to wonder why he was having the burning with the oasis when he does not even feel the actual debridement's themselves. I am somewhat  perplexed by this. 04/19/18 on evaluation today patient's wound actually appears to be showing signs of epithelialization around the edge of the wound and in general actually appears to be doing better which is good news. He did have the same burning after about three days with applying the Endoform last week in the same fashion that I would generally apply a skin substitute. This seems to indicate that it's not the oasis to cause the problem but potentially the moisture buildup that just causes things to burn or there may be some other reaction with the skin prep or Steri-Strips. Nonetheless I'm not sure that is gonna be able to tolerate any skin substitute for a long period of time. The good news is the wound actually appears to be doing better today compared to last week and does seem to finally be making some progress. 04/26/18 on evaluation today patient actually appears to be doing rather well in regard to his ulcer in the right gluteal fold. He has been tolerating the dressing changes without complication which is good news. The Endoform does seem to be helping although he was a little bit more macerated this week. This seems to be an ongoing  issue with fluid control at this point. Nonetheless I think we may be able to add something like Drawtex to help control the drainage. 05/03/18 on evaluation today patient appears to actually be doing better in regard to the overall appearance of his wound. He has been tolerating the dressing changes without complication. Fortunately there appears to be no evidence of infection at this time. I really feel like his wound has shown signs as of today of turning around last week I thought so as well and definitely he could be seen in this week's overall appearance and measurements. In general I'm very pleased with the fact that he finally seems to be making a steady but sure progress. The patient likewise is very pleased. 05/17/18 on evaluation today patient appears to be doing more poorly unfortunately in regard to his ulcer. He has been tolerating the dressing changes without complication. With that being said he tells me that in the past couple of days he and his wife have noticed that we did not seem to be doing quite as well is getting dark near the center. Subsequently upon evaluation today the wound actually does appear to be doing worse compared to previous. He has been tolerating the dressing changes otherwise and he states that he is not been sitting up anymore than he was in the past from what he tells me. Still he has continued to work he states "I'm tired of dealing with this and if I have to just go home and lay in the bed all the time that's what I'll do". Nonetheless I am concerned about the fact that this wound does appear to be deeper than what it was previous. 05/24/18 upon evaluation today patient actually presents after having been in the hospital due to what was presumed to be sepsis secondary to the wound infection. He had an elevated white blood cell count between 14 and 15. With that being said he does seem to be doing somewhat better now. His wound still is giving him some trouble  nonetheless and he is obviously concerned about the fact likely talked about that this does seem to go more deeply than previously noted. I did review his wound culture which showed evidence of Staphylococcus aureus him and group B strep. Nonetheless he is on antibiotics,  Levaquin, for this. Subsequently I did review his intake summary from the hospital as well. I also did look at the CT of the lumbar spine with contrast that was performed which showed no bone destruction to suggest lumbar disguises/osteomyelitis or sacral osteomyelitis. There was no paraspinal abscess. Nonetheless it appears this may have been more of just a soft tissue infection at this point which is good news. He still is ALLEY, GORE. ([id number]) nonetheless concerned about the wound which again I think is completely reasonable considering everything he's been through recently. 05/31/18 on evaluation today on evaluation today patient actually appears to be showing signs of his wound be a little bit deeper than what I would like to see. Fortunately he does not show any signs of significant infection although his temperature was 99 today he states he's been checking this at home and has not been elevated. Nonetheless with the undermining that I'm seeing at this point I am becoming more concerned about the wound I do think that offloading is a key factor here that is preventing the speedy recovery at this point. There does not appear to be any evidence of again over infection noted. He's been using Santyl currently. 06/07/18 the patient presents today for follow-up evaluation regarding the left ulcer in the gluteal region. He has been tolerating the Wound VAC fairly well. He is obviously very frustrated with this he states that to mean is really getting in his way. There does not appear to be any evidence of infection at this time he does have a little bit of odor I do not necessarily associate this with infection just  something that we sometimes notice with Wound VAC therapy. With that being said I can definitely catch a tone of discontentment overall in the patient's demeanor today. This when he was previously in the hospital an CT scan was done of the lumbar region which did not reveal any signs of osteomyelitis. With that being said the pelvis in particular was not evaluated distinctly which means he could still have some osteonecrosis I. Nonetheless the Wound VAC was started on Thursday I do want to get this little bit more time before jumping to a CT scan of the pelvis although that is something that I might would recommend if were not see an improvement by that time. 06/14/18 on evaluation today patient actually appears to be doing about the same in regard to his right gluteal ulcer. Again he did have a CT scan of the lumbar spine unfortunately this did not include the pelvis. Nonetheless with the depth of the wound that I'm seeing today even despite the fact that I'm not seeing any evidence of overt cellulitis I believe there's a good chance that we may be dealing with osteomyelitis somewhere in the right Ischial region. No fevers, chills, nausea, or vomiting noted at this time. 06/21/18 on evaluation today patient actually appears to be doing about the same with regard to his wound. The tunnel at 6 o'clock really does not appear to be any deeper although it is a little bit wider. I think at this point you may want to start packing this with white phone. Unfortunately I have not got approval for the CT scan of the pelvis as of yet due to the fact that Medicare apparently has been denied it due to the diagnosis codes not being appropriate according to Medicare for the test requested. With that being said the patient cannot have an MRI and therefore this is the  only option that we have as far as testing is concerned. The patient has had infection and was on antibiotics and been added code for cellulitis of the  bottom to see if this will be appropriate for getting the test approved. Nonetheless I'm concerned about the infection have been spread deeper into the Ischial region. 06/28/18 on evaluation today patient actually appears to be doing rather well all things considered in regard to the right gluteal ulcer. He has been tolerating the dressing changes without complication. With that being said the Wound VAC he states does have to be replaced almost every day or at least reinforced unfortunately. Patient actually has his CT scan later this morning we should have the results by tomorrow. 07/05/18 on evaluation today patient presents for follow-up concerning his right Ischial ulcer. He did see the surgeon Dr. Lysle Pearl last week. They were actually very happy with him and felt like he spent a tremendous amount of time with them as far as discussing his situation was concerned. In the end Dr. Lysle Pearl did contact me as well and determine that he would not recommend any surgical intervention at this point as he felt like it would not be in the patient's best interest based on what he was seeing. He recommended a referral to infectious disease. Subsequently this is something that Dr. Ines Bloomer office is working on setting up for the patient. As far as evaluation today is concerned the patient's wound actually appears to be worse at this point. I am concerned about how things are progressing and specifically about infection. I do not feel like it's the deeper but the area of depth is definitely widening which does have me concerned. No fevers, chills, nausea, or vomiting noted at this time. I think that we do need initiate antibiotic therapy the patient has an allow allergy to amoxicillin/penicillin he states that he gets a rash since childhood. Nonetheless she's never had the issues with Catholics or cephalosporins in general but he is aware of. 07/27/18 on evaluation today patient presents following admission to the  hospital on 07/09/18. He was subsequently discharged on 07/20/18. On 07/15/18 the patient underwent irrigation and debridement was soft tissue biopsy and bone biopsy as well as placement of a Wound VAC in the OR by Dr. Celine Ahr. During the hospital course the patient was placed on a Wound VAC and recommended follow up with surgery in three weeks actually with Dr. Delaine Lame who is infectious disease. The patient was on vancomycin during the hospital course. 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 He also had a bone culture which revealed evidence of methicillin-resistant staph aureus. He is updated CT scan 07/09/18 reveals that he had progression of the which was performed on wound to breakdown down to the trochanter where he actually had irregularities there as well suggestive of osteomyelitis. This was a change just since 9 December when we last performed a CT scan. Obviously this one had gone downhill quite significantly and rapidly. At this point upon evaluation I feel like in general the patient's wound seems to be doing fairly well all things considered upon my evaluation today. Obviously this is larger and deeper than what I previously evaluated but at the same time he seems to be making some progress as far as the appearance of the granulation tissue is concerned. I'm happy in that regard. No fevers, chills, nausea, or vomiting noted at this time. He is on IV vancomycin and Rocephin at the facility.  He is currently in [facility]. 08/03/18 upon evaluation today patient's wound appears to be doing better in regard to the overall appearance at this point in time. Fortunately he's been tolerating the Wound VAC without complication and states that the facility has been taking excellent care of the wound site. Overall I see some [place] noted on the surface which I am going to attempt sharp debridement today of but nonetheless other than this I feel like he's  making progress. 08/09/18 on evaluation today patient's wound appears to be doing much better compared to even last week's evaluation. Do believe that the Wound VAC is been of great benefit for him. He has been tolerating the dressing changes that is the Wound VAC without any complication and he has excellent granulation noted currently. There is no need for sharp debridement at this point. 08/16/18 on evaluation today patient actually appears to be doing very well in regard to the wound in the right gluteal fold region. This is showing signs of progress and again appears to be very healthy which is excellent news. Fortunately there is no sign of active infection by way of odor or drainage at this point. Overall I'm very pleased with how things stand. He seems to be tolerating the Wound VAC without complication. 08/23/18 on evaluation today patient actually appears to be doing better in regard to his wound. He has been tolerating the Wound VAC without complication and in fact it has been collecting a significant amount of drainage which I think is good news especially considering how the wound appears. Fortunately there is no signs of infection at this time definitely nothing appears to be worse which is good news. He has not been started on [place]. ([id number]) the Bactrim and Flagyl that was recommended by Dr. Delaine Lame yet. I did actually contact her office this morning in order to check and see were things are that regard their gonna be calling me back. 08/30/18 on evaluation today patient actually appears to show signs of excellent improvement today compared to last evaluation. The undermining is getting much better the wound seems to be feeling quite nicely and I'm very pleased that the granulation in general. With that being said overall I feel like the patient has made excellent progress which is great news. No fevers, chills, nausea, or vomiting noted at this time. 09/06/18 on  evaluation today patient actually appears to be doing rather well in regard to his right gluteal ulcer. This is showing signs of improvement in overall I'm very pleased with how things seem to be progressing. The patient likewise is please. Overall I see no evidence of infection he is about to complete his oral antibiotic regimen which is the end of the antibiotics for him in just about three days. 09/13/18 on evaluation today patient's right Ischial ulcer appears to be showing signs of continued improvement which is excellent news. He's been tolerating the dressing changes without complication. Fortunately there's no signs of infection and the wound that seems to be doing very well. 09/28/18 on evaluation today patient appears to be doing rather well in regard to his right Ischial ulcer. He's been tolerating the Wound VAC without complication he knows there's much less drainage than there used to be this obviously is not a bad thing in my pinion. There's no evidence of infection despite the fact is but nothing about it now for several weeks. 10/04/18 on evaluation today patient appears to be doing better in regard to his right  Ischial wound. He has been tolerating the Wound VAC without complication and I do believe that the silver nitrate last week was beneficial for him. Fortunately overall there's no evidence of active infection at this time which is great news. No fevers, chills, nausea, or vomiting noted at this time. 10/11/18 on evaluation today patient actually appears to be doing rather well in regard to his Ischial ulcer. He's been tolerating the Wound VAC still without complication I feel like this is doing a good job. No fevers, chills, nausea, or vomiting noted at this time. 11/01/18 on evaluation today patient presents after having not been seen in our clinic for several weeks secondary to the fact that he was on evaluation today patient presents after having not been seen in our clinic for  several weeks secondary to the fact that he was in a skilled nursing facility which was on lockdown currently due to the covert 19 national emergency. Subsequently he was discharged from the facility on this past Friday and subsequently made an appointment to come in to see yesterday. Fortunately there's no signs of active infection at this time which is good news and overall he does seem to have made progress since I last saw. Overall I feel like things are progressing quite nicely. The patient is having no pain. 11/08/18 on evaluation today patient appears to be doing okay in regard to his right gluteal ulcer. He has been utilizing a Wound VAC home health this changing this at this point since he's home from the skilled nursing facility. Fortunately there's no signs of obvious active infection at this time. Unfortunately though there's no obvious active infection he is having some maceration and his wife states that when the sheets of the Wound VAC office on Sunday when it broke seal that he ended up having significant issues with some smell as well there concerned about the possibility of infection. Fortunately there's No fevers, chills, nausea, or vomiting noted at this time. 11/15/18 on evaluation today patient actually appears to be doing well in regard to his right gluteal ulcer. He has been tolerating the dressing changes without complication. Specifically the Wound VAC has been utilized up to this point. Fortunately there's no signs of infection and overall I feel like he has made progress even since last week when I last saw him. I'm actually fairly happy with the overall appearance although he does seem to have somewhat of a hyper granular overgrowth in the central portion of the wound which I think may require some sharp debridement to try flatness out possibly utilizing chemical cauterization following. 11/23/18 on evaluation today patient actually appears to be doing very well in regard to  his sacral ulcer. He seems to be showing signs of improvement with good granulation. With that being said he still has the small area of hyper granulation right in the central portion of the wound which I'm gonna likely utilize silver nitrate on today. Subsequently he also keeps having a leak at the 6 o'clock location which is unfortunate we may be able to help out with some suggestions to try to prevent this going forward. Fortunately there's no signs of active infection at this time. 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 He's been tolerating the dressing changes without complication. Fortunately there's no signs of active infection at this time. I've been rather pleased with how things have progressed there still some  evidence of pressure getting to the area with some redness right around the immediate wound opening. Nonetheless other than this I'm not seeing any significant complications or issues the wound is somewhat hyper granular. Upon discussing with the patient and his wife today I'm not sure that the wound is being packed to the base with the foam at this point. And if it's not been packed fully that may be part of the reason why is not seen as much improvement as far as the granulation from the base out. Again we do not want pack too tightly but we need some of the firm to get to the base of the wound. I discussed this with patient and his wife today. 12/06/18 on evaluation today patient appears to be doing well in regard to his right gluteal pressure ulcer. He's been tolerating the dressing changes without complication. Fortunately there's no signs of active infection. He still has some hyper granular tissue and I do think it would be appropriate to continue with the chemical cauterization as of today. 12/16/18 on evaluation today patient actually appears to be doing okay in regard to his  right gluteal ulcer. He is been tolerating the dressing changes without complication including the Wound VAC. Overall I feel like nothing seems to be worsening I do feel like that the hyper granulation buds in the central portion of the wound have improved to some degree with the silver nitrate. We will have to see how things continue to progress. 12/20/18 on evaluation today patient actually appears to be doing much worse in my pinion even compared to last week's evaluation. Unfortunately as opposed to showing any signs of improvement the areas of hyper granular tissue in the central portion of the wound seem to be getting worse. Subsequently the wound bed itself also seems to be getting deeper even compared to last week which is both unusual as well as concerning since prior he had been shown signs of improvement. Nonetheless I think that the issue could be that he's actually having some difficulty in issues with a deeper infection. There's no external signs of infection but nonetheless I am more worried about the internal, osteomyelitis, that could be restarting. He has not been on antibiotics for some time at this point. I think that it may be a good idea to go ahead and started back on an antibiotic therapy while we wait to see what the testing shows. LAQUANE, DELAUGHTER ([id number]) 12/27/18 on evaluation today patient presents for follow-up concerning his left gluteal fold wound. Fortunately he appears to be doing well today. I did review the CT scan which was negative for any signs of osteomyelitis or acute abnormality this is excellent news. Overall I feel like the surface of the wound bed appears to be doing significantly better today compared to previously noted findings. There does not appear any signs of infection nor does he have any pain at this time. 01/03/19 on evaluation today patient actually appears to be doing quite well in regard to his ulcer. Post debridement last week he really did  not have too much bleeding which is good news. Fortunately today this seems to be doing some better but we still has some of the hyper granular tissue noted in the base of the wound which is gonna require sharp debridement today as well. Overall I'm pleased with how things seem to be progressing since we switched away from the Wound VAC I think he is making some progress. 01/10/19 on evaluation  today patient appears to be doing better in regard to his right gluteal fold ulcer. He has been tolerating the dressing changes without complication. The debridement to seem to be helping with current away some of the poor hyper granular tissue bugs throughout the region of his gluteal fold wound. He's been tolerating the dressing changes otherwise without complication which is great news. No fevers, chills, nausea, or vomiting noted at this time. 01/17/19 on evaluation today patient actually appears to be doing excellent in regard to his wound. He's been tolerating the dressing changes without complication. Fortunately there is no signs of active infection at this time which is great news. No fevers, chills, nausea, or vomiting noted at this time. 01/24/19 on evaluation today patient actually appears to be doing quite well with regard to his ulcer. He has been tolerating the dressing changes without complication. Fortunately there's no signs of active infection at this time. Overall been very pleased with the progress that he seems to be making currently. 01/31/19-Patient returns at 1 week with apparent similarity in dimensions to the wound, with no signs of infection, he has been changing dressings twice a day 02/08/19 upon evaluation today patient actually appears to be doing well with regard to his right Ischial ulcer. The wound is not appear to be quite as deep and seems to be making progress which is good news. With that being said I'm still reluctant to go back to the Wound VAC at this point. He's been  having to change the dressings twice a day which is a little bit much in my pinion from the wound care supplies standpoint. I think that possibly attempting to utilize extras orbit may be beneficial this may also help to prevent any additional breakdown secondary to fluid retention in the wound itself. The patient is in agreement with giving this a try. 02/15/19 on evaluation today patient actually appears to be doing decently well with regard to his ulcer in the right to gluteal fold location. He's been tolerating the dressing changes without complication. Fortunately there is no signs of active infection at this time. He is able to keep the current dressing in place more effectively for a day at a time whereas before he was having a changes to to three times a day. The actions or has been helpful in this regard. Fortunately there's no signs of anything getting worse and I do feel like he showing signs of good improvement with regard to the wound bed status. 02/22/2019 patient appears to be doing very well today with regard to his ulcer in the gluteal fold. Fortunately there is no signs of active infection and he has been tolerating the dressing changes without any complication. Overall extremely pleased with how things seem to be progressing. He has much less of the hyper granular projections within the wound these have slowly been debrided away and he seems to be doing well. The wound bed is more uniform. 03/01/19 on evaluation today patient appears to be doing unfortunately about the same in regard to his gluteal ulcer. He's been tolerating the dressing changes without complication. Fortunately there's no signs of active infection at this time. With that being said he continues to develop these hyper granular projections which I'm unsure of exactly what they are and why they are rising. Nonetheless I explained to the patient that I do believe it would be a good idea for [place] to stand a biopsy sample for  pathology to see if that can shed any  light on what exactly may be going on here. Fortunately I do not see any obvious signs of infection. With that being said the patient has had a little bit more drainage this week apparently compared to last week. 03/08/2019 on evaluation today patient actually appears to look somewhat better with regard to the appearance of his wound bed at this time. This is good news. Overall I am very pleased with how things seem to have progressed just in the past week with a switch to the [facility] dressing. I think that has been beneficial for him. With that being said at this time the patient is concerned about his biopsy that I sent off last week unfortunately I do not have that report as of yet. Nonetheless we have called to obtain this and hopefully will hear back from the lab later this morning. 03/15/19 on evaluation today patient's wound actually appears to be doing okay today with regard to the overall appearance of the wound bed. He has been tolerating the dressing changes without complication he still has hyper granular tissue noted but fortunately that seems to be minimal at this point compared to some of what we've seen in the past. Nonetheless I do think that he is still having some issues currently with some of this type of granulation the biopsy and since all showed nothing more than just evidence of granulation tissue. Therefore there really is nothing different to initiate or do at this point. 03/24/19 on evaluation today patient appears to be doing a little better with regard to his ulcer. He's been tolerating the dressing changes without complication. Fortunately there is no signs of active infection at this time. No fevers, chills, nausea, or vomiting noted at this time. I'm overall pleased with how things seem to be progressing. 03/29/2019 on evaluation today patient appears to be doing about the same in regard to his ulcer in the right gluteal fold.  Unfortunately he is not seeming to make a lot of progress and the wound is somewhat stalled. There is no signs of active infection externally but I am concerned about the possibility of infection continuing in the ischial location which previously he did have infection noted. Again is not able to have an MRI so probably our best option for testing for this would be a triple phase bone scan which will detect subtle changes in the bone more so than plain film x-rays. In the past they really have not been beneficial and in fact the CT scans even have been somewhat questionable at times. Nonetheless there is no signs of systemic infection which is at least good news but again his wound is not healing at all on the predicted schedule. 04/05/19 on evaluation today patient appears to be doing well all things considering with regard to his wound and the right gluteal fold. He actually has his triple bone scan scheduled for sometime in the next couple of days. With that being said I've also been looking to other possibilities of what could be causing hyper granular tissue were looking into the bone scan again for evaluating for the risk or possibility of infection deeper and I'm also RIP, SCHOENBAUER. ([id number]) go ahead and see about obtaining a deep tissue culture today to send and see if there's any evidence of infection noted on culture. He's in agreement with that plan. 04/12/2019 on evaluation today patient presents for reevaluation here in our clinic concerning ongoing issues with his right gluteal fold ulcer. 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 It was discussed with him at that point that I think it would be appropriate for him to proceed with hyperbaric oxygen therapy especially in light of the fact that he is previously been on IV antibiotics at the beginning of the  year for close to 3 months followed by several weeks of oral antibiotics that was all prescribed by infectious disease. He had surgical debridement around Christmas December 2019 due to an abscess and osteomyelitis of the ischial bone. Unfortunately this has not really proceeded as well as we would have liked and again we did a CT scan even a couple months ago as he cannot have an MRI secondary to having issues with both a pain pump as well as a spinal cord stimulator which prevent him from going into an MRI machine. With that being said there were chronic changes noted at the ischial tuberosity which had progressed since December 2019 there was no evidence of fluid collection on that initial CT scan. With that being said that was on January 21, 2019. I am not sure that it was a sensitive enough test as compared to an MRI and then subsequently I ordered a bone scan for him which was actually completed on 03/29/2019 and this revealed that he does indeed have positive osteomyelitis involving the right ischial tuberosity. This is adjacent to the ulcer and I think is the reason that his ulcer is not healing. Subsequently I am in a place him back on oral antibiotics today unfortunately his wound culture showed just mixed gram-negative flora with no specific findings of a predominant organism. Nonetheless being with the location I think a good broad-spectrum antibiotic for gram-negative's is a good choice at this point he is previously taken Levaquin without significant issues and I think that is an appropriate antibiotic for him at this time. 04/19/2019 on evaluation today patient actually appears to be doing fairly well with regard to his wound. He has been tolerating the dressing changes without complication. Fortunately there is no signs of active infection and he has been taking the oral antibiotics at this time. Subsequently we did make a referral to infectious disease although Dr. Steva Ready wants the patient  to be seen by general surgery first in order apparently to see if there is anything from a surgical standpoint that should be done prior to initiation of IV antibiotic therapy. Again the patient is okay with actually seeing Dr. Celine Ahr whom he has seen before we will make that referral. 04/26/2019 on evaluation today patient actually appears to be doing well with regard to his wound. He has been tolerating the dressing changes without complication. Fortunately there is no signs of active infection at this time. I do believe that the hyperbaric oxygen therapy along with the antibiotics which I prescribed at this point have been doing well for him. With that being said he has not seen Dr. Celine Ahr the surgeon yet in fact they have not been contacted for scheduling an appointment as of yet. Subsequently the patient is not on antibiotics currently by IV Dr. Steva Ready did want him to see Dr. Celine Ahr first which we are working on trying to get scheduled. We again give the information to the patient today for Dr. Celine Ahr and her number as far as contacting their office to see about getting something scheduled. Again were looking for whether or not  they recommend any surgical intervention. 05/03/2019 on evaluation today patient appears to be doing about the same with regard to his wound there may be a little bit of filling in in regard to the base of the wound but still he has quite a bit of hyper granular tissue that I am seeing today. I believe that he may may need a more aggressive sharp debridement possibly even by the surgeon or under anesthesia in order to clear away some of the hyper granular material in order to help allow for appropriate granulation to fill in. We have made a referral to Dr. Celine Ahr unfortunately it sounds as if they did not receive the referral we contacted them today they should be get in touch with the patient. Depending on how things show from that standpoint the patient may need to see  Dr. Ardyth Gal who is the infectious disease specialist although he really does not want a PICC line again. No fevers, chills, nausea, vomiting, or diarrhea. He is tolerating hyperbaric oxygen therapy very well. 05/12/2019 on evaluation today patient presents for follow-up visit concerning his right gluteal fold ulcer. He did see Dr. Celine Ahr the surgeon who previously evaluated his wound and she actually felt like he was doing quite well with regard to the wound based on what she was seen. He does seem to be responding to some degree with the oral antibiotics along with hyperbaric oxygen therapy at this point she did not see any evidence or need for further surgical intervention at this point. She recommended deferring additional or ongoing antibiotic therapy to Dr. Steva Ready at her discretion. Fortunately there is no signs of active systemic infection. No fevers, chills, nausea, vomiting, or diarrhea. 10/29; patient I am seeing really for the first time today in terms of his wound. 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 He is being treated with hyperbaric oxygen for underlying osteomyelitis most recently documented by a three-phase bone scan. He has been on Levaquin for roughly a month and he is out of these and asked me for a refill. Most recent cultures were negative although I do not think these were bone cultures. He has a very irregular surface to this wound which is almost nodular in texture. It undermines superiorly with the same nodular hyper granulated surface. I see that he was referred to general surgery for an operative debridement although the surgeon did not agree with this I think that would have been the proper course of action in this. He has been using Hydrofera Blue. He is supposed to start a wound VAC tomorrow which is actually a reapplication of wound VAC. I think mostly last time they had trouble keeping the seal in place I hope we have better look at  this this time. 05/24/2019 on evaluation today I did review patient's note from Dr. Dellia Nims where he was seen last week when I was out of the office. Subsequently it does appear that Dr. Dellia Nims was in agreement that the patient really needed debridement to clear away some of the nodular tissue in the base of the wound. The good news is he has the wound VAC initiated and some of this tissue is already starting to breakdown under the treatment of the wound VAC again because it is not really viable. Nonetheless I do believe that we can likely perform some debridement today to clear this away and hopefully the continuation of the wound VAC along with hyperbarics and antibiotic therapy will be beneficial  in stopping this from reoccurring. If we get better control in the wound bed in a good spot I think we can definitely use the PriMatrix which we actually did get approval for. 05/31/2019 on evaluation today patient appears to be doing actually pretty well at this point with regard to his wound. He has been tolerating the dressing changes which is good news. Fortunately there is no signs of infection. He still has some of the hyper granular nodules noted we have not really cleared all these away and I think we can try to do a little bit more that today. I did do quite a bit last week I am hoping to get down to the base of the wound so though actually be able to heal more appropriately. I think the wound VAC is doing a good job right now with controlling the drainage and overall the patient is happy with that. 06/07/2019 upon evaluation today patient appears to be doing quite well compared to last week with regard to the hyper granulation in the base of the wound. He has been tolerating the dressing changes without complication. Fortunately there is no signs of active infection at this time. With that being said we have not had any recent measures as far as blood work is concerned I think we may want to check a  few things including a CBC, sed rate, and C-reactive protein. Patient is in agreement with that we can try to get that scheduled for him for this Friday. 06/13/2019 on evaluation today patient actually appears to be doing much better at this point based on what I am seeing with regard to his wound. He DA, BEISEL. ([id number]) has much less in the way of hyper granular tissue which is good news and a lot more healing that is taken place since I last saw him as far as the amount of space within the wound itself. With that being said he is nearing the completion of the initial 40 treatments for hyperbaric oxygen therapy I think this is something I would recommend extending as he does seem to be benefiting at this time. Fortunately there is no evidence of active systemic infection at this point nor even local infection for that matter. 06/20/2019 upon evaluation today patient actually appears to be doing well with regard to his wound. In fact this appears to be doing much better today compared to what it was previous. I been extremely pleased with the progress that he is made. In fact the tunnel is much less deep today than what it was in the past and I am seeing excellent signs of improvement. Overall I am happy with how things are going. I did review his white blood cell count which revealed everything to be okay. The one abnormal finding on his CBC was a hemoglobin of 12.7. Subsequently I did also review his sed rate and C-reactive protein. His sed rate measured in at normal level measuring at 26. The C-reactive protein however was elevated today and an abnormal range indicating inflammation. Still I feel like he is trending in a good direction at this time. He is going to need a refill of the Levaquin he tells me at this point. 07/04/2019 on evaluation today patient actually appears to be doing well in regard to his wound. He has been tolerating the dressing changes without complication.  Fortunately there is no signs of active infection which is good news. No fevers, chills, nausea, vomiting, or diarrhea. The good news also  is that I did review his labs and though his sed rate was slightly elevated the C-reactive protein was actually down compared to the last evaluation 2 weeks ago this is excellent news I think it is showing signs of improvement. 07/11/2019 on evaluation today patient actually appears to be doing quite well with regard to his wound on my evaluation. This is very slow going but nonetheless he seems to be making progress. Especially in regard to the depth. Nonetheless I believe that we may want to consider going forward with the PriMatrix which was previously approved for him. We will get a have to get a new approval as we will actually be applying this in the new year so we will likely obtain that approval on January 4. In the meantime and for the time being my suggestion is going to be that we go ahead and continue with the wound VAC and we were going at this point with the current wound care measures. 12/28-Patient seen after HBO session today, wound depth with the tunnel measures 3.2 cm, the wound bed appears healthy, continuing with the VAC and HBO 07/25/2019 on evaluation today patient's wound appears to be doing about the same compared to my last evaluation. Fortunately there is no evidence of significant infection which is good news we will still waiting on the results of his lab work which unfortunately was not done prior to today's visit which is what we were aiming for. Ultimately there is no sign of active infection at this time which is good news. And he also tells me that the wound VAC has been staying in place and doing fairly well which is also good news. With that being said I am concerned about the fact that the wound has not progressed as much I think we may want to look into getting reapproval for the skin substitute, specifically PriMatrix, that I am  hopeful will help to allow this area to granulate in more effectively. I would recommend as well continuing with the wound VAC along with this. 08/01/2019 on evaluation today patient appears to be doing in my opinion slightly better in regard to the wound. He actually does have some tightening of the walls around the edges which is a good thing. Fortunately there is no signs of active infection at this time. No fever chills noted. I do believe that the patient in general seems to be making progress although is very slow. Obviously were trying to get things to speed up somewhat more. No fevers, chills, nausea, vomiting, or diarrhea. 08/08/2019 upon evaluation today patient appears to be doing well with regard to his wound although there is not a significant improvement overall we do have the PriMatrix for availability today to apply. Fortunately there is no signs of active infection and the patient overall seems to be doing well. There is really no need for aggressive sharp debridement at this point either which is also good news. I do believe her get a need to suture and the primary checks in the basin in the PriMatrix in order to keep it in place at the base of the wound underneath the wound VAC. This was discussed with the patient today as well. He is in agreement the plan. He really does not have any filling in the area and so this should not be a complication as far as suturing is concerned. 08/15/2019 on evaluation today patient appears to be doing somewhat better in my opinion in regard to the overall appearance  of his wound. He has been tolerating the dressing changes without complication. Fortunately the skin subappear to still be in place when this was changed on Friday. With that being said it appears that the PriMatrix may have dissolved in the interim between now and then as the sutures are no longer holding anything in place and again I would sutured it into place. Nonetheless I believe that  we can go ahead and likely consider going forward that we may need to apply the PriMatrix weekly as opposed every other week as it does seem to have dissolved rather readily and well. Overall the wound bed appears to be doing excellent at this time. 08/22/2019 upon evaluation today the patient does seem to making some progress with regard to his wound. Overall there is some better quality tissue and less of the hyper granular protrusions all of which is good news. With that being said we do have the PriMatrix available for application today and I am in a try to find a better way to suture this in place for him today. 08/29/2019 on evaluation today patient appears to be doing very well in regard to his wound. I do see signs of new granulation which seems to be somewhat helpful he did have some need for sharp debridement today to clear some of the hyper granulation away but nonetheless I feel like we are headed in a good direction. Fortunately there is no evidence of any systemic infection although we are going to go ahead and reorder the CBC, C-reactive protein, and sed rate today to see where things stand I did just refill his Levaquin as well. 09/05/2019 upon evaluation today patient's wound actually is not appearing to be showing signs of significant improvement compared to what we have seen previous from the standpoint of depth. He overall wound size wise seems to be about the same and again some of the quality of the wound bed is still doing fairly well. I did review his labs and his CBC was normal with no signs of elevation white blood cell count, his C-reactive protein was normal, and the sed rate was slightly elevated but again I think this may just be due to ulceration really there is nothing significant to indicate severe infection. I think when he likely finishes his current round of oral antibiotics with Levaquin will likely take him off of the medication at that point based on the findings. I  do feel like the hyperbarics helped in getting the infection under control as well as improving the overall size of the wound quite significantly. With that being said at this point I really feel like more more at the time frame that he may benefit from a muscle flap in order to try to help this wound heal effectively. I think that this would be the fastest way in order to do this. In the past he has been somewhat resistant to going down that path but nonetheless I feel like he likely should. JESSY, REDHOUSE ([id number]) 09/12/2019 on evaluation today patient appears to be doing fairly well in regard to his wound. He is tolerating the wound VAC without complication is still continues to drain significantly. Fortunately there is no signs of active infection at this time which is great news his lab work is also been doing very well. With that being said I do think at this point that the patient is still deriving benefit from the wound VAC especially in regard to controlling moisture which I think  it is doing fairly well. He also has some improvement in the overall wound bed and on the medial aspect of the wound there are some hyper granular projections but nothing as severe as what they have been in the past. I am good have to perform some sharp debridement at this location today. 09/19/2019 upon evaluation today patient appears to be doing roughly the same although again the tissue does seem to be showing some signs of improvement which is good news. There is less hyper granular budding. Nonetheless he still is going require some sharp debridement today. We did get his albumin and prealbumin results back and they are within normal limits in both cases which is excellent news that was the one thing [facility] plastic surgery was waiting on order to get him scheduled. He does seem to be a good candidate physically for the flap surgery if he decides to go that direction. With that being said is still left to be  determined once they see the wound what they feel about how best to proceed. Obviously in the end it would be up to Mr. Pera as to whether he goes forward with the flap surgery or something else depending on the recommendations. Electronic Signature(s) Signed: 09/19/2019 8:45:07 AM By: Worthy Keeler [name] Entered By: Worthy Keeler on 09/19/2019 08:45:07 EMERSYN, OSTERGARD ([id number]) -------------------------------------------------------------------------------- Physical Exam Details Patient Name: [name] Date of Service: 09/19/2019 8:00 AM Medical Record Number: [id number] Patient Account Number: [id number] Date of Birth/Sex: 1951-06-21 (68 y.o. M) Treating RN: Primary Care Provider: [name] Other Clinician: Referring Provider: [name] Treating Provider/Extender: [name], Garyn Arlotta Weeks in Treatment: 52 Constitutional Well-nourished and well-hydrated in no acute distress. Respiratory normal breathing without difficulty. Psychiatric this patient is able to make decisions and demonstrates good insight into disease process. Alert and Oriented x 3. pleasant and cooperative. Notes Patient's wound bed currently showed signs of good granulation in some areas there was some hyper granulation budding noted at others especially on the lateral wall. I did perform sharp debridement to clear some of this away today which the patient tolerated without complication post debridement the wound bed appears to be cleaner with less of this hyper granular budding. We are going to continue with the wound VAC at this point. Electronic Signature(s) Signed: 09/19/2019 8:45:36 AM By: Worthy Keeler [name] Entered By: Worthy Keeler on 09/19/2019 08:45:35 PAYCE, DIETZLER ([id number]) -------------------------------------------------------------------------------- Physician Orders Details Patient Name: [name] Date of Service: 09/19/2019 8:00 AM Medical Record  Number: [id number] Patient Account Number: [id number] Date of Birth/Sex: 03/01/1951 (68 y.o. 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 - in office o Cleanse wound with mild soap and water Anesthetic (add to Medication List) Wound #1 Right Gluteal fold o Topical Lidocaine 4% cream applied to wound bed prior to debridement (In Clinic Only). Skin Barriers/Peri-Wound Care o Skin Prep Primary Wound Dressing Wound #1 Right Gluteal fold o Saline moistened gauze - in clinic only Secondary Dressing Wound #1 Right Gluteal fold o Tegaderm - in  clinic only o XtraSorb - in clinic only Dressing Change Frequency Wound #1 Right Gluteal fold o Change Dressing Monday, Wednesday, Friday Follow-up Appointments Wound #1 Right Gluteal fold o Return Appointment in 1 week. Off-Loading Wound #1 Right Gluteal fold o Turn and reposition every 2 hours - no pressure on wounded area Home Health Wound #1 Right Gluteal fold o [place] Visits - [place] Nurse may visit PRN to address patientos wound care needs. o FACE TO FACE ENCOUNTER: MEDICARE and MEDICAID PATIENTS: I certify that this patient is under my care and that I had a face-to- face encounter that meets the physician face-to-face encounter requirements with this patient on this date. The encounter with the patient was in whole  or in part for the following MEDICAL CONDITION: (primary reason for [place]) MEDICAL NECESSITY: XAYLEN, BREDEHOFT ([id number]) I certify, that based on my findings, NURSING services are a medically necessary home health service. HOME BOUND STATUS: I certify that my clinical findings support that this patient is homebound (i.e., Due to illness or injury, pt requires aid of supportive devices such as crutches, cane, wheelchairs, walkers, the use of special transportation or the assistance of another person to leave their place of residence. There is a normal inability to leave the home and doing so requires considerable and taxing effort. Other absences are for medical reasons / religious services and are infrequent or of short duration when for other reasons). o If current dressing causes regression in wound condition, may D/C ordered dressing product/s and apply Normal Saline Moist Dressing daily until next [place] / Other MD appointment. [place] of regression in wound condition at [phone]. o Please direct any NON-WOUND related issues/requests for orders to patient's Primary Care Physician Negative Pressure Wound Therapy Wound #1 Right Gluteal fold o Wound VAC settings at 125/130 mmHg continuous pressure. Use BLACK/GREEN foam to wound cavity. Use WHITE foam to fill any tunnel/s and/or undermining. Change VAC dressing 3 X WEEK. Change canister as indicated when full. Nurse may titrate settings and frequency of dressing changes as clinically indicated. - Apply wound vac as normal o Home Health Nurse may d/c VAC for s/s of increased infection, significant wound regression, or uncontrolled drainage. [place] at [phone]. o Apply contact layer over base of wound. - contact layer over sutured Primatrix in tunnel at 12:00. o Number of foam/gauze pieces used in the dressing = Medications-please add to medication  list. Wound #1 Right Gluteal fold o P.O. Antibiotics - Complete antibiotics. Electronic Signature(s) Signed: 09/19/2019 11:44:39 AM By: Army Melia Signed: 09/19/2019 4:51:53 PM By: Worthy Keeler [name] Entered By: Army Melia on 09/19/2019 08:37:08 ROBSON, HENSCHEN ([id number]) -------------------------------------------------------------------------------- Problem List Details Patient Name: [name] Date of Service: 09/19/2019 8:00 AM Medical Record Number: [id number] Patient Account Number: [id number] Date of Birth/Sex: June 20, 1951 (69 y.o. 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 He states that there is no history of diabetes that he is aware of. He does have issues with his prostate and is currently receiving treatment for this by way of oral medication. With that being said I do not have a lot of details in that regard. Nonetheless the patient presents today as a result of having been referred to [place] by another provider initially home health was set to come out and take care of his wound although due to the fact that he apparently drives he's not able to receive home health. His wife is therefore trying to help take care of this wound within although they have been struggling with what exactly to do at this point. She states that she can do some things but she is definitely not a nurse and does have some issues with looking at blood. The good news is the wound does not appear to be too deep and is fairly superficial at this point. There is no slough noted there is some nonviable skin noted around the surface of the wound and the perimeter at this point. The central portion of the wound appears to be very good with a dermal layer noted this does not appear to be again deep enough to extend it to subcutaneous tissue at this point. 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 In the pump he has Dilaudid and baclofen. 12/07/17 on evaluation today patient presents for follow-up concerning his ongoing lower back thigh ulcer on the right. He states that he did not get the supplies ordered and therefore has not really been able to perform the dressing changes  as directed exactly. His wife was able to get some Boarder Foam Dressing's from the drugstore and subsequently has been using hydrogel which did help to a degree in the wound does appear to be able smaller. There is actually more drainage this week noted than previous. 12/21/17 on evaluation today patient appears to be doing rather well in regard to his right gluteal ulcer. He has been tolerating the dressing changes without complication. There does not appear to be any evidence of infection at this point in time. Overall the wound does seem to be making some progress as far as the edges are concerned there's not as much in the way of overlapping of the external wound edges and he has a good epithelium to wound bed border for the most part. This however is not true right at the 12 o'clock location over the span of a little over a centimeters which actually will require debridement today to clean this away and hopefully allow it to continue to heal more appropriately. 12/28/17 on evaluation today  patient appears to be doing rather well in regard to his ulcer in the left gluteal region. He's been tolerating the dressing changes without complication. Apparently he has had some difficulty getting his dressing material. Apparently there's been some confusion with ordering we're gonna check into this. Nonetheless overall he's been showing signs of improvement which is good news. Debridement is not required today. 01/04/18 on evaluation today patient presents for follow-up concerning his right gluteal ulcer. He has been tolerating the dressing changes fairly well. On inspection today it appears he may actually have some maceration them concerned about the fact that he may be developing too much moisture in and around the wound bed which can cause delay in healing. With that being said he unfortunately really has not showed significant signs of improvement since last week's evaluation in fact this may even be  just the little bit/slightly larger. Nonetheless he's been having a lot of discomfort I'm not sure this is even related to the wound as he has no pain when I'm to breeding or otherwise cleaning the wound during evaluation today. Nonetheless this is something that we did recommend he talked to his pain specialist concerning. 01/11/18 on evaluation today patient appears to be doing better in regard to his ulceration. He has been tolerating the dressing changes without complication. With that being said overall there's no evidence of infection which is good news. The only thing is he did receive the hatch affair blue classic versus the ready nonetheless I feel like this is perfectly fine and appears to have done well for him over the past week. 01/25/18 on evaluation today patient's wound actually appears to be a little bit larger than during the last evaluation. The good news is the majority of the wound edges actually appear to be fairly firmly attached to the wound bed unfortunately again we're not really making progress in regard to the size. Roughly the wound is about the same size as when I first saw him although again the wound margin/edges appear to be much better. 02/01/18 on evaluation today patient actually appears to be doing very well in regard to his wound. Applying the Prisma dry does seem to be better although he does still have issues with slow progression of the wound. There was a slight improvement compared to last week's measurements today. Nonetheless I have been considering other options as far as the possibility of Theraskin or even a snap vac. In general I'm not sure that the Theraskin due to location of the wound would be a very good idea. Nonetheless I do think that a snap vac could be a possibility for the patient and in fact I think this could even be an excellent way to manage the wound possibly seeing some improvement in a very rapid fashion here. Nonetheless this is something  that we would need to get approved and I did have a lengthy conversation with the patient about this today. 02/08/18 on evaluation today patient appears to be doing a little better in regard to his ulcer. He has been tolerating the dressing changes without complication. Fortunately despite the fact that the wound is a little bit smaller it's not significantly so unfortunately. We have discussed the possibility of a snap vac we did check with insurance this is actually covered at this point. Fortunately there does not appear to be any sign of infection. Overall I'm LILY, DUTKO. ([id number]) fairly pleased with how things seem to be appearing at this point. 02/15/18 on  evaluation today patient appears to be doing rather well in regard to his right gluteal ulcer. Unfortunately the snap vac did not stay in place with his sheer and friction this came loose and did not seem to maintain seal very well. He worked for about two days and it did seem to do very well during that time according to his wife but in general this does not seem to be something that's gonna be beneficial for him long-term. I do believe we need to go back to standard dressings to see if we can find something that will be of benefit. 03/02/18- He is here in follow up evaluation; there is minimal change in the wound. He will continue with the same treatment plan, would consider changing to iodosrob/iodoflex if ulcer continues to to plateau. He will follow up next week 03/08/18 on evaluation today patient's wound actually appears to be about the same size as when I previously saw him several weeks back. Unfortunately he does have some slightly dark discoloration in the central portion of the wound which has me concerned about pressure injury. I do believe he may be sitting for too long a period of time in fact he tells me that "I probably sit for much too long". He does have some Slough noted on the surface of the wound and again as far  as the size of the wound is concerned I'm really not seeing anything that seems to have improved significantly. 03/15/18 on evaluation today patient appears to be doing fairly well in regard to his ulcer. The wound measured pretty much about the same today compared to last week's evaluation when looking at his graph. With that being said the area of bruising/deep tissue injury that was noted last week I do not see at this point. He did get a new cushion fortunately this does seem to be have been of benefit in my pinion. It does appear that he's been off of this more which is good news as well I think that is definitely showing in the overall wound measurements. With that being said I do believe that he needs to continue to offload I don't think that the fact this is doing better should be or is going to allow him to not have to offload and explain this to him as well. Overall he seems to be in agreement the plan I think he understands. The overall appearance of the wound bed is improved compared to last week I think the Iodoflex has been beneficial in that regard. 03/29/18 on evaluation today patient actually appears to be doing rather well in regard to his wound from the overall appearance standpoint he does have some granulation although there's some Slough on the surface of the wound noted as well. With that being said he unfortunately has not improved in regard to the overall measurement of the wound in volume or in size. I did have a discussion with him very specifically about offloading today. He actually does work although he mainly is just sitting throughout the day. He tells me he offloads by "lifting himself up for 30 seconds off of his chair occasionally" purchase from [employer] which does seem to have helped. And he has a new cushion that he with that being said he's also able to stand some for a very short period of time but not significant enough I think to provide appropriate  offloading. I think the biggest issue at this point with the wound and the fact is  not healing as quickly as we would like is due to the fact that he is really not able to appropriately offload while at work. He states the beginning after his injury he actually had a bed at his job that he could lay on in order to offload and that does seem to have been of help back at that time. Nonetheless he had not done this in quite some time unfortunately. I think that could be helpful for him this is something I would like for him to look into. 04/05/18 on evaluation today patient actually presents for follow-up concerning his right gluteal ulcer. Again he really is not significantly improved even compared to last week. He has been tolerating the dressing changes without complication. With that being said fortunately there appears to be no evidence of infection at this time. He has been more proactive in trying to offload. 04/12/18 on evaluation today patient actually appears to be doing a little better in regard to his wound and the right gluteal fold region. He's been tolerating the dressing changes since removing the oasis without complication. However he was having a lot of burning initially with the oasis in place. He's unsure of exactly why this was given so much discomfort but he assumes that it was the oasis itself causing the problem. Nonetheless this had to be removed after about three days in place although even those three days seem to have made a fairly good improvement in regard to the overall appearance of the wound bed. In fact is the first time that he's made any improvement from the standpoint of measurements in about six weeks. He continues to have no discomfort over the area of the wound itself which leads me to wonder why he was having the burning with the oasis when he does not even feel the actual debridement's themselves. I am somewhat perplexed by this. 04/19/18 on evaluation today  patient's wound actually appears to be showing signs of epithelialization around the edge of the wound and in general actually appears to be doing better which is good news. He did have the same burning after about three days with applying the Endoform last week in the same fashion that I would generally apply a skin substitute. This seems to indicate that it's not the oasis to cause the problem but potentially the moisture buildup that just causes things to burn or there may be some other reaction with the skin prep or Steri-Strips. Nonetheless I'm not sure that is gonna be able to tolerate any skin substitute for a long period of time. The good news is the wound actually appears to be doing better today compared to last week and does seem to finally be making some progress. 04/26/18 on evaluation today patient actually appears to be doing rather well in regard to his ulcer in the right gluteal fold. He has been tolerating the dressing changes without complication which is good news. The Endoform does seem to be helping although he was a little bit more macerated this week. This seems to be an ongoing issue with fluid control at this point. Nonetheless I think we may be able to add something like Drawtex to help control the drainage. 05/03/18 on evaluation today patient appears to actually be doing better in regard to the overall appearance of his wound. He has been tolerating the dressing changes without complication. Fortunately there appears to be no evidence of infection at this time. I really feel like his wound has shown signs  as of today of turning around last week I thought so as well and definitely he could be seen in this week's overall appearance and measurements. In general I'm very pleased with the fact that he finally seems to be making a steady but sure progress. The patient likewise is very pleased. 05/17/18 on evaluation today patient appears to be doing more poorly unfortunately in  regard to his ulcer. He has been tolerating the dressing changes without complication. With that being said he tells me that in the past couple of days he and his wife have noticed that we did not seem to be doing quite as well is getting dark near the center. Subsequently upon evaluation today the wound actually does appear to be doing worse compared to previous. He has been tolerating the dressing changes otherwise and he states that he is not been sitting up anymore than he was in the past from what he tells me. Still he has continued to work he states "I'm tired of dealing with this and if I have to just go home and lay in the bed all the time that's what I'll do". Nonetheless I am concerned about the fact that this wound does appear to be deeper than what it was previous. 05/24/18 upon evaluation today patient actually presents after having been in the hospital due to what was presumed to be sepsis secondary to the wound infection. He had an elevated white blood cell count between 14 and 15. With that being said he does seem to be doing somewhat better now. MADDAX, RABINE ([id number]) His wound still is giving him some trouble nonetheless and he is obviously concerned about the fact likely talked about that this does seem to go more deeply than previously noted. I did review his wound culture which showed evidence of Staphylococcus aureus him and group B strep. Nonetheless he is on antibiotics, Levaquin, for this. Subsequently I did review his intake summary from the hospital as well. I also did look at the CT of the lumbar spine with contrast that was performed which showed no bone destruction to suggest lumbar disguises/osteomyelitis or sacral osteomyelitis. There was no paraspinal abscess. Nonetheless it appears this may have been more of just a soft tissue infection at this point which is good news. He still is nonetheless concerned about the wound which again I think is completely  reasonable considering everything he's been through recently. 05/31/18 on evaluation today on evaluation today patient actually appears to be showing signs of his wound be a little bit deeper than what I would like to see. Fortunately he does not show any signs of significant infection although his temperature was 99 today he states he's been checking this at home and has not been elevated. Nonetheless with the undermining that I'm seeing at this point I am becoming more concerned about the wound I do think that offloading is a key factor here that is preventing the speedy recovery at this point. There does not appear to be any evidence of again over infection noted. He's been using Santyl currently. 06/07/18 the patient presents today for follow-up evaluation regarding the left ulcer in the gluteal region. He has been tolerating the Wound VAC fairly well. He is obviously very frustrated with this he states that to mean is really getting in his way. There does not appear to be any evidence of infection at this time he does have a little bit of odor I do not necessarily associate this with infection  just something that we sometimes notice with Wound VAC therapy. With that being said I can definitely catch a tone of discontentment overall in the patient's demeanor today. This when he was previously in the hospital an CT scan was done of the lumbar region which did not reveal any signs of osteomyelitis. With that being said the pelvis in particular was not evaluated distinctly which means he could still have some osteonecrosis I. Nonetheless the Wound VAC was started on Thursday I do want to get this little bit more time before jumping to a CT scan of the pelvis although that is something that I might would recommend if were not see an improvement by that time. 06/14/18 on evaluation today patient actually appears to be doing about the same in regard to his right gluteal ulcer. Again he did have a CT scan  of the lumbar spine unfortunately this did not include the pelvis. Nonetheless with the depth of the wound that I'm seeing today even despite the fact that I'm not seeing any evidence of overt cellulitis I believe there's a good chance that we may be dealing with osteomyelitis somewhere in the right Ischial region. No fevers, chills, nausea, or vomiting noted at this time. 06/21/18 on evaluation today patient actually appears to be doing about the same with regard to his wound. The tunnel at 6 o'clock really does not appear to be any deeper although it is a little bit wider. I think at this point you may want to start packing this with white phone. Unfortunately I have not got approval for the CT scan of the pelvis as of yet due to the fact that Medicare apparently has been denied it due to the diagnosis codes not being appropriate according to Medicare for the test requested. With that being said the patient cannot have an MRI and therefore this is the only option that we have as far as testing is concerned. The patient has had infection and was on antibiotics and been added code for cellulitis of the bottom to see if this will be appropriate for getting the test approved. Nonetheless I'm concerned about the infection have been spread deeper into the Ischial region. 06/28/18 on evaluation today patient actually appears to be doing rather well all things considered in regard to the right gluteal ulcer. He has been tolerating the dressing changes without complication. With that being said the Wound VAC he states does have to be replaced almost every day or at least reinforced unfortunately. Patient actually has his CT scan later this morning we should have the results by tomorrow. 07/05/18 on evaluation today patient presents for follow-up concerning his right Ischial ulcer. He did see the surgeon Dr. Lysle Pearl last week. They were actually very happy with him and felt like he spent a tremendous amount of  time with them as far as discussing his situation was concerned. In the end Dr. Lysle Pearl did contact me as well and determine that he would not recommend any surgical intervention at this point as he felt like it would not be in the patient's best interest based on what he was seeing. He recommended a referral to infectious disease. Subsequently this is something that Dr. Ines Bloomer office is working on setting up for the patient. As far as evaluation today is concerned the patient's wound actually appears to be worse at this point. I am concerned about how things are progressing and specifically about infection. I do not feel like it's the deeper but the  area of depth is definitely widening which does have me concerned. No fevers, chills, nausea, or vomiting noted at this time. I think that we do need initiate antibiotic therapy the patient has an allow allergy to amoxicillin/penicillin he states that he gets a rash since childhood. Nonetheless she's never had the issues with Catholics or cephalosporins in general but he is aware of. 07/27/18 on evaluation today patient presents following admission to the hospital on 07/09/18. He was subsequently discharged on 07/20/18. On 07/15/18 the patient underwent irrigation and debridement was soft tissue biopsy and bone biopsy as well as placement of a Wound VAC in the OR by Dr. Celine Ahr. During the hospital course the patient was placed on a Wound VAC and recommended follow up with surgery in three weeks actually with Dr. Delaine Lame who is infectious disease. The patient was on vancomycin during the hospital course. 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 He also had a bone culture which revealed evidence of methicillin-resistant staph aureus. He is updated CT scan 07/09/18 reveals that he had progression of the which was performed on wound to breakdown down to the trochanter where he actually had irregularities there as well  suggestive of osteomyelitis. This was a change just since 9 December when we last performed a CT scan. Obviously this one had gone downhill quite significantly and rapidly. At this point upon evaluation I feel like in general the patient's wound seems to be doing fairly well all things considered upon my evaluation today. Obviously this is larger and deeper than what I previously evaluated but at the same time he seems to be making some progress as far as the appearance of the granulation tissue is concerned. I'm happy in that regard. No fevers, chills, nausea, or vomiting noted at this time. He is on IV vancomycin and Rocephin at the facility. He is currently in [facility]. 08/03/18 upon evaluation today patient's wound appears to be doing better in regard to the overall appearance at this point in time. Fortunately he's been tolerating the Wound VAC without complication and states that the facility has been taking excellent care of the wound site. Overall I see some Slough noted on the surface which I am going to attempt sharp debridement today of but nonetheless other than this I feel like he's making progress. 08/09/18 on evaluation today patient's wound appears to be doing much better compared to even last week's evaluation. Do believe that the Wound VAC is been of great benefit for him. He has been tolerating the dressing changes that is the Wound VAC without any complication and he has excellent granulation noted currently. There is no need for sharp debridement at this point. 08/16/18 on evaluation today patient actually appears to be doing very well in regard to the wound in the right gluteal fold region. This is showing signs of progress and again appears to be very healthy which is excellent news. Fortunately there is no sign of active infection by way of odor or drainage at JAHEEM, BIGWOOD. ([id number]) this point. Overall I'm very pleased with how things stand. He seems to be  tolerating the Wound VAC without complication. 08/23/18 on evaluation today patient actually appears to be doing better in regard to his wound. He has been tolerating the Wound VAC without complication and in fact it has been collecting a significant amount of drainage which I think is good news especially considering how the wound appears. Fortunately there is no signs of  infection at this time definitely nothing appears to be worse which is good news. He has not been started on the Bactrim and Flagyl that was recommended by Dr. Delaine Lame yet. I did actually contact her office this morning in order to check and see were things are that regard their gonna be calling me back. 08/30/18 on evaluation today patient actually appears to show signs of excellent improvement today compared to last evaluation. The undermining is getting much better the wound seems to be feeling quite nicely and I'm very pleased that the granulation in general. With that being said overall I feel like the patient has made excellent progress which is great news. No fevers, chills, nausea, or vomiting noted at this time. 09/06/18 on evaluation today patient actually appears to be doing rather well in regard to his right gluteal ulcer. This is showing signs of improvement in overall I'm very pleased with how things seem to be progressing. The patient likewise is please. Overall I see no evidence of infection he is about to complete his oral antibiotic regimen which is the end of the antibiotics for him in just about three days. 09/13/18 on evaluation today patient's right Ischial ulcer appears to be showing signs of continued improvement which is excellent news. He's been tolerating the dressing changes without complication. Fortunately there's no signs of infection and the wound that seems to be doing very well. 09/28/18 on evaluation today patient appears to be doing rather well in regard to his right Ischial ulcer. He's been  tolerating the Wound VAC without complication he knows there's much less drainage than there used to be this obviously is not a bad thing in my pinion. There's no evidence of infection despite the fact is but nothing about it now for several weeks. 10/04/18 on evaluation today patient appears to be doing better in regard to his right Ischial wound. He has been tolerating the Wound VAC without complication and I do believe that the silver nitrate last week was beneficial for him. Fortunately overall there's no evidence of active infection at this time which is great news. No fevers, chills, nausea, or vomiting noted at this time. 10/11/18 on evaluation today patient actually appears to be doing rather well in regard to his Ischial ulcer. He's been tolerating the Wound VAC still without complication I feel like this is doing a good job. No fevers, chills, nausea, or vomiting noted at this time. 11/01/18 on evaluation today patient presents after having not been seen in our clinic for several weeks secondary to the fact that he was on evaluation today patient presents after having not been seen in our clinic for several weeks secondary to the fact that he was in a skilled nursing facility which was on lockdown currently due to the covert 19 national emergency. Subsequently he was discharged from the facility on this past Friday and subsequently made an appointment to come in to see yesterday. Fortunately there's no signs of active infection at this time which is good news and overall he does seem to have made progress since I last saw. Overall I feel like things are progressing quite nicely. The patient is having no pain. 11/08/18 on evaluation today patient appears to be doing okay in regard to his right gluteal ulcer. He has been utilizing a Wound VAC home health this changing this at this point since he's home from the skilled nursing facility. Fortunately there's no signs of obvious active infection at  this time. Unfortunately though there's  no obvious active infection he is having some maceration and his wife states that when the sheets of the Wound VAC office on Sunday when it broke seal that he ended up having significant issues with some smell as well there concerned about the possibility of infection. Fortunately there's No fevers, chills, nausea, or vomiting noted at this time. 11/15/18 on evaluation today patient actually appears to be doing well in regard to his right gluteal ulcer. He has been tolerating the dressing changes without complication. Specifically the Wound VAC has been utilized up to this point. Fortunately there's no signs of infection and overall I feel like he has made progress even since last week when I last saw him. I'm actually fairly happy with the overall appearance although he does seem to have somewhat of a hyper granular overgrowth in the central portion of the wound which I think may require some sharp debridement to try flatness out possibly utilizing chemical cauterization following. 11/23/18 on evaluation today patient actually appears to be doing very well in regard to his sacral ulcer. He seems to be showing signs of improvement with good granulation. With that being said he still has the small area of hyper granulation right in the central portion of the wound which I'm gonna likely utilize silver nitrate on today. Subsequently he also keeps having a leak at the 6 o'clock location which is unfortunate we may be able to help out with some suggestions to try to prevent this going forward. Fortunately there's no signs of active infection at this time. 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 He's been tolerating the dressing changes without complication. Fortunately there's no signs of active infection at this time. I've been rather pleased with how things have  progressed there still some evidence of pressure getting to the area with some redness right around the immediate wound opening. Nonetheless other than this I'm not seeing any significant complications or issues the wound is somewhat hyper granular. Upon discussing with the patient and his wife today I'm not sure that the wound is being packed to the base with the foam at this point. And if it's not been packed fully that may be part of the reason why is not seen as much improvement as far as the granulation from the base out. Again we do not want pack too tightly but we need some of the firm to get to the base of the wound. I discussed this with patient and his wife today. 12/06/18 on evaluation today patient appears to be doing well in regard to his right gluteal pressure ulcer. He's been tolerating the dressing changes without complication. Fortunately there's no signs of active infection. He still has some hyper granular tissue and I do think it would be appropriate to continue with the chemical cauterization as of today. 12/16/18 on evaluation today patient actually appears to be doing okay in regard to his right gluteal ulcer. He is been tolerating the dressing changes without complication including the Wound VAC. Overall I feel like nothing seems to be worsening I do feel like that the hyper granulation buds in the central portion of the wound have improved to some degree with the silver nitrate. We will have to see how things continue to progress. 12/20/18 on evaluation today patient actually appears to be doing much worse in my pinion even compared to last week's evaluation. Unfortunately as opposed  to showing any signs of improvement the areas of hyper granular tissue in the central portion of the wound seem to be getting worse. Subsequently the wound bed itself also seems to be getting deeper even compared to last week which is both unusual as well as concerning since JOHNNYE, SENTZ.  ([id number]) prior he had been shown signs of improvement. Nonetheless I think that the issue could be that he's actually having some difficulty in issues with a deeper infection. There's no external signs of infection but nonetheless I am more worried about the internal, osteomyelitis, that could be restarting. He has not been on antibiotics for some time at this point. I think that it may be a good idea to go ahead and started back on an antibiotic therapy while we wait to see what the testing shows. 12/27/18 on evaluation today patient presents for follow-up concerning his left gluteal fold wound. Fortunately he appears to be doing well today. I did review the CT scan which was negative for any signs of osteomyelitis or acute abnormality this is excellent news. Overall I feel like the surface of the wound bed appears to be doing significantly better today compared to previously noted findings. There does not appear any signs of infection nor does he have any pain at this time. 01/03/19 on evaluation today patient actually appears to be doing quite well in regard to his ulcer. Post debridement last week he really did not have too much bleeding which is good news. Fortunately today this seems to be doing some better but we still has some of the hyper granular tissue noted in the base of the wound which is gonna require sharp debridement today as well. Overall I'm pleased with how things seem to be progressing since we switched away from the Wound VAC I think he is making some progress. 01/10/19 on evaluation today patient appears to be doing better in regard to his right gluteal fold ulcer. He has been tolerating the dressing changes without complication. The debridement to seem to be helping with current away some of the poor hyper granular tissue bugs throughout the region of his gluteal fold wound. He's been tolerating the dressing changes otherwise without complication which is great news. No fevers,  chills, nausea, or vomiting noted at this time. 01/17/19 on evaluation today patient actually appears to be doing excellent in regard to his wound. He's been tolerating the dressing changes without complication. Fortunately there is no signs of active infection at this time which is great news. No fevers, chills, nausea, or vomiting noted at this time. 01/24/19 on evaluation today patient actually appears to be doing quite well with regard to his ulcer. He has been tolerating the dressing changes without complication. Fortunately there's no signs of active infection at this time. Overall been very pleased with the progress that he seems to be making currently. 01/31/19-Patient returns at 1 week with apparent similarity in dimensions to the wound, with no signs of infection, he has been changing dressings twice a day 02/08/19 upon evaluation today patient actually appears to be doing well with regard to his right Ischial ulcer. The wound is not appear to be quite as deep and seems to be making progress which is good news. With that being said I'm still reluctant to go back to the Wound VAC at this point. He's been having to change the dressings twice a day which is a little bit much in my pinion from the wound care supplies standpoint. I  think that possibly attempting to utilize extras orbit may be beneficial this may also help to prevent any additional breakdown secondary to fluid retention in the wound itself. The patient is in agreement with giving this a try. 02/15/19 on evaluation today patient actually appears to be doing decently well with regard to his ulcer in the right to gluteal fold location. He's been tolerating the dressing changes without complication. Fortunately there is no signs of active infection at this time. He is able to keep the current dressing in place more effectively for a day at a time whereas before he was having a changes to to three times a day. The actions or has been  helpful in this regard. Fortunately there's no signs of anything getting worse and I do feel like he showing signs of good improvement with regard to the wound bed status. 02/22/2019 patient appears to be doing very well today with regard to his ulcer in the gluteal fold. Fortunately there is no signs of active infection and he has been tolerating the dressing changes without any complication. Overall extremely pleased with how things seem to be progressing. He has much less of the hyper granular projections within the wound these have slowly been debrided away and he seems to be doing well. The wound bed is more uniform. 03/01/19 on evaluation today patient appears to be doing unfortunately about the same in regard to his gluteal ulcer. He's been tolerating the dressing changes without complication. Fortunately there's no signs of active infection at this time. With that being said he continues to develop these hyper granular projections which I'm unsure of exactly what they are and why they are rising. Nonetheless I explained to the patient that I do believe it would be a good idea for [place] to stand a biopsy sample for pathology to see if that can shed any light on what exactly may be going on here. Fortunately I do not see any obvious signs of infection. With that being said the patient has had a little bit more drainage this week apparently compared to last week. 03/08/2019 on evaluation today patient actually appears to look somewhat better with regard to the appearance of his wound bed at this time. This is good news. Overall I am very pleased with how things seem to have progressed just in the past week with a switch to the [facility] dressing. I think that has been beneficial for him. With that being said at this time the patient is concerned about his biopsy that I sent off last week unfortunately I do not have that report as of yet. Nonetheless we have called to obtain this and hopefully will  hear back from the lab later this morning. 03/15/19 on evaluation today patient's wound actually appears to be doing okay today with regard to the overall appearance of the wound bed. He has been tolerating the dressing changes without complication he still has hyper granular tissue noted but fortunately that seems to be minimal at this point compared to some of what we've seen in the past. Nonetheless I do think that he is still having some issues currently with some of this type of granulation the biopsy and since all showed nothing more than just evidence of granulation tissue. Therefore there really is nothing different to initiate or do at this point. 03/24/19 on evaluation today patient appears to be doing a little better with regard to his ulcer. He's been tolerating the dressing changes without complication. Fortunately there  is no signs of active infection at this time. No fevers, chills, nausea, or vomiting noted at this time. I'm overall pleased with how things seem to be progressing. 03/29/2019 on evaluation today patient appears to be doing about the same in regard to his ulcer in the right gluteal fold. Unfortunately he is not seeming to make a lot of progress and the wound is somewhat stalled. There is no signs of active infection externally but I am concerned about the possibility of infection continuing in the ischial location which previously he did have infection noted. Again is not able to have an MRI so probably our best option for testing for this would be a triple phase bone scan which will detect subtle changes in the bone more so than plain film x-rays. In the past they really have not been beneficial and in fact the CT scans even have been somewhat questionable at times. Nonetheless there is no signs of systemic infection Screws, Artavius J. ([id number]) which is at least good news but again his wound is not healing at all on the predicted schedule. 04/05/19 on evaluation today  patient appears to be doing well all things considering with regard to his wound and the right gluteal fold. He actually has his triple bone scan scheduled for sometime in the next couple of days. With that being said I've also been looking to other possibilities of what could be causing hyper granular tissue were looking into the bone scan again for evaluating for the risk or possibility of infection deeper and I'm also gonna go ahead and see about obtaining a deep tissue culture today to send and see if there's any evidence of infection noted on culture. He's in agreement with that plan. 04/12/2019 on evaluation today patient presents for reevaluation here in our clinic concerning ongoing issues with his right gluteal fold ulcer. 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 It was discussed with him at that point that I think it would be appropriate for him to proceed with hyperbaric oxygen therapy especially in light of the fact that he is previously been on IV antibiotics at the beginning of the year for close to 3 months followed by several weeks of oral antibiotics that was all prescribed by infectious disease. He had surgical debridement around Christmas December 2019 due to an abscess and osteomyelitis of the ischial bone. Unfortunately this has not really proceeded as well as we would have liked and again we did a CT scan even a couple months ago as he cannot have an MRI secondary to having issues with both a pain pump as well as a spinal cord stimulator which prevent him from going into an MRI machine. With that being said there were chronic changes noted at the ischial tuberosity which had progressed since December 2019 there was no evidence of fluid collection on that initial CT scan. With that being said that was on January 21, 2019. I am not sure that it was a sensitive enough  test as compared to an MRI and then subsequently I ordered a bone scan for him which was actually completed on 03/29/2019 and this revealed that he does indeed have positive osteomyelitis involving the right ischial tuberosity. This is adjacent to the ulcer and I think is the reason that his ulcer is not healing. Subsequently I am in a place him back on oral antibiotics  today unfortunately his wound culture showed just mixed gram-negative flora with no specific findings of a predominant organism. Nonetheless being with the location I think a good broad-spectrum antibiotic for gram-negative's is a good choice at this point he is previously taken Levaquin without significant issues and I think that is an appropriate antibiotic for him at this time. 04/19/2019 on evaluation today patient actually appears to be doing fairly well with regard to his wound. He has been tolerating the dressing changes without complication. Fortunately there is no signs of active infection and he has been taking the oral antibiotics at this time. Subsequently we did make a referral to infectious disease although Dr. Steva Ready wants the patient to be seen by general surgery first in order apparently to see if there is anything from a surgical standpoint that should be done prior to initiation of IV antibiotic therapy. Again the patient is okay with actually seeing Dr. Celine Ahr whom he has seen before we will make that referral. 04/26/2019 on evaluation today patient actually appears to be doing well with regard to his wound. He has been tolerating the dressing changes without complication. Fortunately there is no signs of active infection at this time. I do believe that the hyperbaric oxygen therapy along with the antibiotics which I prescribed at this point have been doing well for him. With that being said he has not seen Dr. Celine Ahr the surgeon yet in fact they have not been contacted for scheduling an appointment as of yet.  Subsequently the patient is not on antibiotics currently by IV Dr. Steva Ready did want him to see Dr. Celine Ahr first which we are working on trying to get scheduled. We again give the information to the patient today for Dr. Celine Ahr and her number as far as contacting their office to see about getting something scheduled. Again were looking for whether or not they recommend any surgical intervention. 05/03/2019 on evaluation today patient appears to be doing about the same with regard to his wound there may be a little bit of filling in in regard to the base of the wound but still he has quite a bit of hyper granular tissue that I am seeing today. I believe that he may may need a more aggressive sharp debridement possibly even by the surgeon or under anesthesia in order to clear away some of the hyper granular material in order to help allow for appropriate granulation to fill in. We have made a referral to Dr. Celine Ahr unfortunately it sounds as if they did not receive the referral we contacted them today they should be get in touch with the patient. Depending on how things show from that standpoint the patient may need to see Dr. Ardyth Gal who is the infectious disease specialist although he really does not want a PICC line again. No fevers, chills, nausea, vomiting, or diarrhea. He is tolerating hyperbaric oxygen therapy very well. 05/12/2019 on evaluation today patient presents for follow-up visit concerning his right gluteal fold ulcer. He did see Dr. Celine Ahr the surgeon who previously evaluated his wound and she actually felt like he was doing quite well with regard to the wound based on what she was seen. He does seem to be responding to some degree with the oral antibiotics along with hyperbaric oxygen therapy at this point she did not see any evidence or need for further surgical intervention at this point. She recommended deferring additional or ongoing antibiotic therapy to Dr. Steva Ready at her  discretion. Fortunately there is no  signs of active systemic infection. No fevers, chills, nausea, vomiting, or diarrhea. 10/29; patient I am seeing really for the first time today in terms of his wound. 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 He is being treated with hyperbaric oxygen for underlying osteomyelitis most recently documented by a three-phase bone scan. He has been on Levaquin for roughly a month and he is out of these and asked me for a refill. Most recent cultures were negative although I do not think these were bone cultures. He has a very irregular surface to this wound which is almost nodular in texture. It undermines superiorly with the same nodular hyper granulated surface. I see that he was referred to general surgery for an operative debridement although the surgeon did not agree with this I think that would have been the proper course of action in this. He has been using Hydrofera Blue. He is supposed to start a wound VAC tomorrow which is actually a reapplication of wound VAC. I think mostly last time they had trouble keeping the seal in place I hope we have better look at this this time. 05/24/2019 on evaluation today I did review patient's note from Dr. Dellia Nims where he was seen last week when I was out of the office. Subsequently it does appear that Dr. Dellia Nims was in agreement that the patient really needed debridement to clear away some of the nodular tissue in the base of the wound. The good news is he has the wound VAC initiated and some of this tissue is already starting to breakdown under the treatment of the wound VAC again because it is not really viable. Nonetheless I do believe that we can likely perform some debridement today to clear this away and hopefully the continuation of the wound VAC along with hyperbarics and antibiotic therapy will be beneficial in stopping this from reoccurring. If we get better control in the wound bed in a good  spot I think we can definitely use the PriMatrix which we actually did get approval for. 05/31/2019 on evaluation today patient appears to be doing actually pretty well at this point with regard to his wound. He has been tolerating the dressing changes which is good news. Fortunately there is no signs of infection. He still has some of the hyper granular nodules noted we have not really cleared all these away and I think we can try to do a little bit more that today. I did do quite a bit last week I am hoping to get down to the base of the wound so though actually be able to heal more appropriately. I think the wound VAC is doing a good job right now with controlling the drainage and overall the patient is happy with that. 06/07/2019 upon evaluation today patient appears to be doing quite well compared to last week with regard to the hyper granulation in the base of the [employer], Jeven J. ([id number]) wound. He has been tolerating the dressing changes without complication. Fortunately there is no signs of active infection at this time. With that being said we have not had any recent measures as far as blood work is concerned I think we may want to check a few things including a CBC, sed rate, and C-reactive protein. Patient is in agreement with that we can try to get that scheduled for him for this Friday. 06/13/2019 on evaluation today patient actually appears to be doing much better at this point based on what  I am seeing with regard to his wound. He has much less in the way of hyper granular tissue which is good news and a lot more healing that is taken place since I last saw him as far as the amount of space within the wound itself. With that being said he is nearing the completion of the initial 40 treatments for hyperbaric oxygen therapy I think this is something I would recommend extending as he does seem to be benefiting at this time. Fortunately there is no evidence of active  systemic infection at this point nor even local infection for that matter. 06/20/2019 upon evaluation today patient actually appears to be doing well with regard to his wound. In fact this appears to be doing much better today compared to what it was previous. I been extremely pleased with the progress that he is made. In fact the tunnel is much less deep today than what it was in the past and I am seeing excellent signs of improvement. Overall I am happy with how things are going. I did review his white blood cell count which revealed everything to be okay. The one abnormal finding on his CBC was a hemoglobin of 12.7. Subsequently I did also review his sed rate and C-reactive protein. His sed rate measured in at normal level measuring at 26. The C-reactive protein however was elevated today and an abnormal range indicating inflammation. Still I feel like he is trending in a good direction at this time. He is going to need a refill of the Levaquin he tells me at this point. 07/04/2019 on evaluation today patient actually appears to be doing well in regard to his wound. He has been tolerating the dressing changes without complication. Fortunately there is no signs of active infection which is good news. No fevers, chills, nausea, vomiting, or diarrhea. The good news also is that I did review his labs and though his sed rate was slightly elevated the C-reactive protein was actually down compared to the last evaluation 2 weeks ago this is excellent news I think it is showing signs of improvement. 07/11/2019 on evaluation today patient actually appears to be doing quite well with regard to his wound on my evaluation. This is very slow going but nonetheless he seems to be making progress. Especially in regard to the depth. Nonetheless I believe that we may want to consider going forward with the PriMatrix which was previously approved for him. We will get a have to get a new approval as we will actually be  applying this in the new year so we will likely obtain that approval on January 4. In the meantime and for the time being my suggestion is going to be that we go ahead and continue with the wound VAC and we were going at this point with the current wound care measures. 12/28-Patient seen after HBO session today, wound depth with the tunnel measures 3.2 cm, the wound bed appears healthy, continuing with the VAC and HBO 07/25/2019 on evaluation today patient's wound appears to be doing about the same compared to my last evaluation. Fortunately there is no evidence of significant infection which is good news we will still waiting on the results of his lab work which unfortunately was not done prior to today's visit which is what we were aiming for. Ultimately there is no sign of active infection at this time which is good news. And he also tells me that the wound VAC has been staying in place  and doing fairly well which is also good news. With that being said I am concerned about the fact that the wound has not progressed as much I think we may want to look into getting reapproval for the skin substitute, specifically PriMatrix, that I am hopeful will help to allow this area to granulate in more effectively. I would recommend as well continuing with the wound VAC along with this. 08/01/2019 on evaluation today patient appears to be doing in my opinion slightly better in regard to the wound. He actually does have some tightening of the walls around the edges which is a good thing. Fortunately there is no signs of active infection at this time. No fever chills noted. I do believe that the patient in general seems to be making progress although is very slow. Obviously were trying to get things to speed up somewhat more. No fevers, chills, nausea, vomiting, or diarrhea. 08/08/2019 upon evaluation today patient appears to be doing well with regard to his wound although there is not a significant improvement  overall we do have the PriMatrix for availability today to apply. Fortunately there is no signs of active infection and the patient overall seems to be doing well. There is really no need for aggressive sharp debridement at this point either which is also good news. I do believe her get a need to suture and the primary checks in the basin in the PriMatrix in order to keep it in place at the base of the wound underneath the wound VAC. This was discussed with the patient today as well. He is in agreement the plan. He really does not have any filling in the area and so this should not be a complication as far as suturing is concerned. 08/15/2019 on evaluation today patient appears to be doing somewhat better in my opinion in regard to the overall appearance of his wound. He has been tolerating the dressing changes without complication. Fortunately the skin subappear to still be in place when this was changed on Friday. With that being said it appears that the PriMatrix may have dissolved in the interim between now and then as the sutures are no longer holding anything in place and again I would sutured it into place. Nonetheless I believe that we can go ahead and likely consider going forward that we may need to apply the PriMatrix weekly as opposed every other week as it does seem to have dissolved rather readily and well. Overall the wound bed appears to be doing excellent at this time. 08/22/2019 upon evaluation today the patient does seem to making some progress with regard to his wound. Overall there is some better quality tissue and less of the hyper granular protrusions all of which is good news. With that being said we do have the PriMatrix available for application today and I am in a try to find a better way to suture this in place for him today. 08/29/2019 on evaluation today patient appears to be doing very well in regard to his wound. I do see signs of new granulation which seems to be somewhat  helpful he did have some need for sharp debridement today to clear some of the hyper granulation away but nonetheless I feel like we are headed in a good direction. Fortunately there is no evidence of any systemic infection although we are going to go ahead and reorder the CBC, C-reactive protein, and sed rate today to see where things stand I did just refill his Levaquin  as well. 09/05/2019 upon evaluation today patient's wound actually is not appearing to be showing signs of significant improvement compared to what we have seen previous from the standpoint of depth. He overall wound size wise seems to be about the same and again some of the quality of the wound bed is still doing fairly well. I did review his labs and his CBC was normal with no signs of elevation white blood cell count, his C-reactive protein was normal, and the sed rate was slightly elevated but again I think this may just be due to ulceration really there is nothing significant to indicate severe infection. I think when he likely finishes his current round of oral antibiotics with Levaquin will likely take him off of the medication at that point based on the [place], TERRIN DITMAN. ([id number]) findings. I do feel like the hyperbarics helped in getting the infection under control as well as improving the overall size of the wound quite significantly. With that being said at this point I really feel like more more at the time frame that he may benefit from a muscle flap in order to try to help this wound heal effectively. I think that this would be the fastest way in order to do this. In the past he has been somewhat resistant to going down that path but nonetheless I feel like he likely should. 09/12/2019 on evaluation today patient appears to be doing fairly well in regard to his wound. He is tolerating the wound VAC without complication is still continues to drain significantly. Fortunately there is no signs of active infection at this  time which is great news his lab work is also been doing very well. With that being said I do think at this point that the patient is still deriving benefit from the wound VAC especially in regard to controlling moisture which I think it is doing fairly well. He also has some improvement in the overall wound bed and on the medial aspect of the wound there are some hyper granular projections but nothing as severe as what they have been in the past. I am good have to perform some sharp debridement at this location today. 09/19/2019 upon evaluation today patient appears to be doing roughly the same although again the tissue does seem to be showing some signs of improvement which is good news. There is less hyper granular budding. Nonetheless he still is going require some sharp debridement today. We did get his albumin and prealbumin results back and they are within normal limits in both cases which is excellent news that was the one thing [facility] plastic surgery was waiting on order to get him scheduled. He does seem to be a good candidate physically for the flap surgery if he decides to go that direction. With that being said is still left to be determined once they see the wound what they feel about how best to proceed. Obviously in the end it would be up to Mr. Trembly as to whether he goes forward with the flap surgery or something else depending on the recommendations. Objective Constitutional Well-nourished and well-hydrated in no acute distress. Vitals Time Taken: 8:10 AM, Height: 73 in, Weight: 210 lbs, BMI: 27.7, Temperature: 98.6 F, Pulse: 82 bpm, Respiratory Rate: 16 breaths/min, Blood Pressure: 136/81 mmHg. Respiratory normal breathing without difficulty. Psychiatric this patient is able to make decisions and demonstrates good insight into disease process. Alert and Oriented x 3. pleasant and cooperative. General Notes: Patient's wound bed currently showed  signs of good granulation in some  areas there was some hyper granulation budding noted at others especially on the lateral wall. I did perform sharp debridement to clear some of this away today which the patient tolerated without complication post debridement the wound bed appears to be cleaner with less of this hyper granular budding. We are going to continue with the wound VAC at this point. Integumentary (Hair, Skin) Wound #1 status is Open. Original cause of wound was Pressure Injury. The wound is located on the Right Gluteal fold. The wound measures 3.2cm length x 1.6cm width x 2.2cm depth; 4.021cm^2 area and 8.847cm^3 volume. There is Fat Layer (Subcutaneous Tissue) Exposed exposed. There is no undermining noted, however, there is tunneling at 12:00 with a maximum distance of 4cm. There is a large amount of serous drainage noted. The wound margin is epibole. There is large (67-100%) pink, hyper - granulation within the wound bed. There is a small (1-33%) amount of necrotic tissue within the wound bed including Adherent Slough. 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 ([id number]) Procedures Wound #1 Pre-procedure diagnosis of Wound #1 is a Pressure Ulcer located on the Right Gluteal fold . There was a Excisional Skin/Subcutaneous Tissue Debridement with a total area of 5.12 sq cm performed by [name], Bairon Klemann E., [name]. With the following instrument(s): Curette to remove Non-Viable tissue/material. Material removed includes Subcutaneous Tissue after achieving pain control using Lidocaine. A time out was conducted at 08:33, prior to the start of the procedure. A Minimum amount of bleeding was controlled with Pressure. The procedure was tolerated well. Post Debridement Measurements:  3.2cm length x 1.6cm width x 2.2cm depth; 8.847cm^3 volume. Post debridement Stage noted as Category/Stage IV. Character of Wound/Ulcer Post Debridement is stable. Post procedure Diagnosis Wound #1: Same as Pre-Procedure Plan Wound Cleansing: Wound #1 Right Gluteal fold: Clean wound with Normal Saline. - in office Cleanse wound with mild soap and water Anesthetic (add to Medication List): Wound #1 Right Gluteal fold: Topical Lidocaine 4% cream applied to wound bed prior to debridement (In Clinic Only). Skin Barriers/Peri-Wound Care: Skin Prep Primary Wound Dressing: Wound #1 Right Gluteal fold: Saline moistened gauze - in clinic only Secondary Dressing: Wound #1 Right Gluteal fold: Tegaderm - in clinic only XtraSorb - in clinic only Dressing Change Frequency: Wound #1 Right Gluteal fold: Change Dressing Monday, Wednesday, Friday Follow-up Appointments: Wound #1 Right Gluteal fold: Return Appointment in 1 week. Off-Loading: Wound #1 Right Gluteal fold: Turn and reposition every 2 hours - no pressure on wounded area Home Health: Wound #1 Right Gluteal fold: Continue Home Health Visits - [place] Nurse may visit PRN to address patient s wound care needs. FACE TO FACE ENCOUNTER: MEDICARE and MEDICAID PATIENTS: I certify that this patient is under my care and that I had a face-to-face encounter that meets the physician face-to-face encounter requirements with this patient on this date. The encounter with the patient was in whole or in part for the following MEDICAL CONDITION: (primary reason for [place]) MEDICAL NECESSITY: I certify, that based on my findings, NURSING services are a medically necessary home health service. HOME BOUND STATUS: I certify that my clinical findings support that this patient is homebound (i.e., Due to illness or injury, pt requires aid of supportive devices such as crutches, cane, wheelchairs, walkers,  the use of special transportation  or the assistance of another person to leave their place of residence. There is a normal inability to leave the home and doing so requires considerable and taxing effort. Other absences are for medical reasons / religious services and are infrequent or of short duration when for other reasons). If current dressing causes regression in wound condition, may D/C ordered dressing product/s and apply Normal Saline Moist Dressing daily until next [place] / Other MD appointment. Garner of regression in wound condition at [phone]. Please direct any NON-WOUND related issues/requests for orders to patient's Primary Care Physician Negative Pressure Wound Therapy: Wound #1 Right Gluteal fold: Wound VAC settings at 125/130 mmHg continuous pressure. Use BLACK/GREEN foam to wound cavity. Use WHITE foam to fill any tunnel/s and/or undermining. Change VAC dressing 3 X WEEK. Change canister as indicated when full. Nurse may titrate settings and frequency of dressing changes as clinically indicated. - Apply wound vac as normal Home Health Nurse may d/c VAC for s/s of increased infection, significant wound regression, or uncontrolled drainage. [place] CHERON, NEYER. ([id number]) at [phone]. Apply contact layer over base of wound. - contact layer over sutured Primatrix in tunnel at 12:00. Number of foam/gauze pieces used in the dressing = Medications-please add to medication list.: Wound #1 Right Gluteal fold: P.O. Antibiotics - Complete antibiotics. 1. My suggestion at this time is can be that we go ahead and continue with the current wound care measures specifically with regard to the wound VAC which I do believe is managing the drainage quite well he still is getting quite a bit of drainage into the canister even after just a few days. 2. I am also going to recommend currently that he continue with the referral to plastic surgery at [facility] in order  to see if there is anything they can do as far as a flap or otherwise to help heal this more effectively and quickly. The patient is in agreement with that plan he hopefully will have an appointment soon all the paperwork and lab testing has now been sent to them they were just waiting on the albumin and prealbumin to come back which were normal. We will see patient back for reevaluation in 1 week here in the clinic. If anything worsens or changes patient will contact our office for additional recommendations. Electronic Signature(s) Signed: 09/19/2019 8:46:51 AM By: Worthy Keeler [name] Entered By: Worthy Keeler on 09/19/2019 08:46:51 KASHDON, DELMORE ([id number]) -------------------------------------------------------------------------------- SuperBill Details Patient Name: [name] Date of Service: 09/19/2019 Medical Record Number: [id number] Patient Account Number: [id number] Date of Birth/Sex: 12/09/1950 (68 y.o. 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, sequela I10 Essential (primary) hypertension Facility Procedures CPT4 Code: [id number] Description: 11042 - DEB SUBQ TISSUE 20 SQ CM/< Modifier: Quantity: 1 CPT4 Code: Description: ICD-10 Diagnosis Description L89.314 Pressure ulcer of right buttock, stage 4 Modifier: Quantity: Physician Procedures CPT4 [id number] Description: 99213 - WC PHYS LEVEL 3 - EST PT Modifier: 25 Quantity: 1 CPT4 Code: Description: ICD-10 Diagnosis Description L89.314 Pressure ulcer of right buttock, stage 4 M86.68 Other  chronic osteomyelitis, other site L03.317 Cellulitis of buttock  G82.20 Paraplegia, unspecified Modifier: Quantity: CPT4 Code: [id number] Description: 11042 - WC PHYS SUBQ TISS 20 SQ CM Modifier: Quantity: 1 CPT4 Code: Description: ICD-10 Diagnosis Description L89.314 Pressure ulcer of right buttock, stage 4 Modifier: Quantity: Electronic Signature(s) Signed: 09/19/2019 8:47:11 AM By: Worthy Keeler [name] Entered By: Worthy Keeler on 09/19/2019 08:47:10

## 2019-09-23 NOTE — Progress Notes (Signed)
[name], [name] ([id number]) Visit Report for 09/12/2019 Chief Complaint Document Details Patient Name: [name], STANDARD Date of Service: 09/12/2019 8:00 AM Medical Record Number: [id number] Patient Account Number: [id number] Date of Birth/Sex: December 22, 1950 (69 y.o. M) Treating RN: Primary Care Provider: [name] Other Clinician: Referring Provider: [name] Treating Provider/Extender: [name], Saudia Smyser Weeks in Treatment: 58 Information Obtained from: Patient Chief Complaint Right gluteal fold ulcer Electronic Signature(s) Signed: 09/12/2019 5:29:33 PM By: Worthy Keeler [name] Entered By: Worthy Keeler on 09/12/2019 08:18:47 EHTAN, TRANUM ([id number]) -------------------------------------------------------------------------------- Debridement Details Patient Name: [name] Date of Service: 09/12/2019 8:00 AM Medical Record Number: [id number] Patient Account Number: [id number] Date of Birth/Sex: 15-Apr-1951 (68 y.o. M) Treating RN: [name] Primary Care Provider: [name] Other Clinician: Referring Provider: [name] Treating Provider/Extender: [name], Erving Sassano Weeks in Treatment: 42 Debridement Performed for Wound #1 Right Gluteal fold Assessment: Performed By: Physician [name], Jedi Catalfamo E., [name] Debridement Type: Debridement Level of Consciousness (Pre- Awake and Alert procedure): Pre-procedure Verification/Time Out Yes - 08:27 Taken: Start Time: 08:27 Pain Control: Lidocaine Total Area Debrided (L x W): 1 (cm) x 1.6 (cm) = 1.6 (cm) Tissue and other material debrided: Viable, Non-Viable, Slough, Subcutaneous, Slough Level: Skin/Subcutaneous Tissue Debridement Description: Excisional Instrument: Curette Bleeding: Minimum Hemostasis Achieved: Pressure End Time: 08:32 Response to Treatment: Procedure was tolerated well Level of Consciousness (Post- Awake and Alert procedure): Post Debridement Measurements of Total  Wound Length: (cm) 3.2 Stage: Category/Stage IV Width: (cm) 1.6 Depth: (cm) 2.4 Volume: (cm) 9.651 Character of Wound/Ulcer Post Debridement: Stable Post Procedure Diagnosis Same as Pre-procedure Electronic Signature(s) Signed: 09/12/2019 5:29:33 PM By: Worthy Keeler [name] Signed: 09/23/2019 5:40:44 PM By: Gretta Cool, BSN, RN, CWS, Kim RN, BSN Entered By: Gretta Cool, BSN, RN, CWS, Kim on 09/12/2019 08:30:33 [name], [name] ([id number]) -------------------------------------------------------------------------------- HPI Details Patient Name: [name] Date of Service: 09/12/2019 8:00 AM Medical Record Number: [id number] Patient Account Number: [id number] Date of Birth/Sex: 1951-03-11 (68 y.o. 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 He states that there is no history of diabetes that he is aware of. He does have issues with his prostate and is currently receiving treatment for this by way of oral medication. With that being said I do not have a lot of details in that regard. Nonetheless the patient presents today as a result of having been referred to [place] by another provider initially home health was set to come out and take care of his wound although due to the fact that he apparently drives he's not able to receive home health. His wife is therefore trying to help take care of this wound within although they have been struggling with what exactly to do at this point. She states that she can do some things but she is definitely not a nurse and does have  some issues with looking at blood. The good news is the wound does not appear to be too deep and is fairly superficial at this point. There is no slough noted there is some nonviable skin noted around the surface of the wound and the  perimeter at this point. The central portion of the wound appears to be very good with a dermal layer noted this does not appear to be again deep enough to extend it to subcutaneous tissue at this point. 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 In the pump he has Dilaudid and baclofen. 12/07/17 on evaluation today patient presents for follow-up concerning his ongoing lower back thigh ulcer on the right. He states that he did not get the supplies ordered and therefore has not really been able to perform the dressing changes as directed exactly. His wife was able to get some Boarder Foam Dressing's from the drugstore and subsequently has been using hydrogel which did help to a degree in the wound does appear to be able smaller. There is actually more drainage this week noted than previous. 12/21/17 on evaluation today patient appears to be doing rather well in regard to his right gluteal ulcer. He has been tolerating the dressing changes without complication. There does not appear to be any evidence of infection at this point in time. Overall the wound does seem to be making some progress as far as the edges are concerned there's not as much in the way of overlapping of the external wound edges and he has a good epithelium to wound bed border for the most part. This however is not true right at the 12 o'clock location over the span of a little over a centimeters which actually will require debridement today to clean this away and hopefully allow it to continue to heal more appropriately. 12/28/17 on evaluation today patient appears to be doing rather well in regard to his  ulcer in the left gluteal region. He's been tolerating the dressing changes without complication. Apparently he has had some difficulty getting his dressing material. Apparently there's been some confusion with ordering we're gonna check into this. Nonetheless overall he's been showing signs of improvement which is good news. Debridement is not required today. 01/04/18 on evaluation today patient presents for follow-up concerning his right gluteal ulcer. He has been tolerating the dressing changes fairly well. On inspection today it appears he may actually have some maceration them concerned about the fact that he may be developing too much moisture in and around the wound bed which can cause delay in healing. With that being said he unfortunately really has not showed significant signs of improvement since last week's evaluation in fact this may even be just the little bit/slightly larger. Nonetheless he's been having a lot of discomfort I'm not sure this is even related to the wound as he has no pain when I'm to breeding or otherwise cleaning the wound during evaluation today. Nonetheless this is something that we did recommend he talked to his pain specialist concerning. 01/11/18 on evaluation today patient appears to be doing better in regard to his ulceration. He has been tolerating the dressing changes without complication. With that being said overall there's no evidence of infection which is good news. The only thing is he did receive the hatch affair blue classic versus the ready nonetheless I feel like this is perfectly fine and appears to have done well for him over the past week. 01/25/18 on evaluation today patient's wound actually appears to be a little bit larger than during the last evaluation. The good news is the majority of the wound edges actually appear to be fairly firmly  attached to the wound bed unfortunately again we're not really making progress in regard to the size. Roughly the  wound is about the same size as when I first saw him although again the wound margin/edges appear to be much better. 02/01/18 on evaluation today patient actually appears to be doing very well in regard to his wound. Applying the Prisma dry does seem to be better although he does still have issues with slow progression of the wound. There was a slight improvement compared to last week's measurements today. Nonetheless I have been considering other options as far as the possibility of Theraskin or even a snap vac. In general I'm not sure that the Theraskin due to location of the wound would be a very good idea. Nonetheless I do think that a snap vac could be a possibility for the patient and in fact I think this could even be an excellent way to manage the wound possibly seeing some improvement in a very rapid fashion here. Nonetheless this is something that we would need to get approved and I did have a lengthy conversation with the patient about this today. 02/08/18 on evaluation today patient appears to be doing a little better in regard to his ulcer. He has been tolerating the dressing changes without complication. Fortunately despite the fact that the wound is a little bit smaller it's not significantly so unfortunately. We have discussed the possibility of a snap vac we did check with insurance this is actually covered at this point. Fortunately there does not appear to be any sign of infection. Overall I'm fairly pleased with how things seem to be appearing at this point. 02/15/18 on evaluation today patient appears to be doing rather well in regard to his right gluteal ulcer. Unfortunately the snap vac did not stay in place with his sheer and friction this came loose and did not seem to maintain seal very well. He worked for about two days and it did seem to do very well during that time according to his wife but in general this does not seem to be something that's gonna be beneficial for him  long-term. I do believe we THIENAN, PERSONS. ([id number]) need to go back to standard dressings to see if we can find something that will be of benefit. 03/02/18- He is here in follow up evaluation; there is minimal change in the wound. He will continue with the same treatment plan, would consider changing to iodosrob/iodoflex if ulcer continues to to plateau. He will follow up next week 03/08/18 on evaluation today patient's wound actually appears to be about the same size as when I previously saw him several weeks back. Unfortunately he does have some slightly dark discoloration in the central portion of the wound which has me concerned about pressure injury. I do believe he may be sitting for too long a period of time in fact he tells me that "I probably sit for much too long". He does have some Slough noted on the surface of the wound and again as far as the size of the wound is concerned I'm really not seeing anything that seems to have improved significantly. 03/15/18 on evaluation today patient appears to be doing fairly well in regard to his ulcer. The wound measured pretty much about the same today compared to last week's evaluation when looking at his graph. With that being said the area of bruising/deep tissue injury that was noted last week I do not see at this point. He did  get a new cushion fortunately this does seem to be have been of benefit in my pinion. It does appear that he's been off of this more which is good news as well I think that is definitely showing in the overall wound measurements. With that being said I do believe that he needs to continue to offload I don't think that the fact this is doing better should be or is going to allow him to not have to offload and explain this to him as well. Overall he seems to be in agreement the plan I think he understands. The overall appearance of the wound bed is improved compared to last week I think the Iodoflex has been beneficial in  that regard. 03/29/18 on evaluation today patient actually appears to be doing rather well in regard to his wound from the overall appearance standpoint he does have some granulation although there's some Slough on the surface of the wound noted as well. With that being said he unfortunately has not improved in regard to the overall measurement of the wound in volume or in size. I did have a discussion with him very specifically about offloading today. He actually does work although he mainly is just sitting throughout the day. He tells me he offloads by "lifting himself up for 30 seconds off of his chair occasionally" purchase from [employer] which does seem to have helped. And he has a new cushion that he with that being said he's also able to stand some for a very short period of time but not significant enough I think to provide appropriate offloading. I think the biggest issue at this point with the wound and the fact is not healing as quickly as we would like is due to the fact that he is really not able to appropriately offload while at work. He states the beginning after his injury he actually had a bed at his job that he could lay on in order to offload and that does seem to have been of help back at that time. Nonetheless he had not done this in quite some time unfortunately. I think that could be helpful for him this is something I would like for him to look into. 04/05/18 on evaluation today patient actually presents for follow-up concerning his right gluteal ulcer. Again he really is not significantly improved even compared to last week. He has been tolerating the dressing changes without complication. With that being said fortunately there appears to be no evidence of infection at this time. He has been more proactive in trying to offload. 04/12/18 on evaluation today patient actually appears to be doing a little better in regard to his wound and the right gluteal fold region. He's  been tolerating the dressing changes since removing the oasis without complication. However he was having a lot of burning initially with the oasis in place. He's unsure of exactly why this was given so much discomfort but he assumes that it was the oasis itself causing the problem. Nonetheless this had to be removed after about three days in place although even those three days seem to have made a fairly good improvement in regard to the overall appearance of the wound bed. In fact is the first time that he's made any improvement from the standpoint of measurements in about six weeks. He continues to have no discomfort over the area of the wound itself which leads me to wonder why he was having the burning with the oasis when he  does not even feel the actual debridement's themselves. I am somewhat perplexed by this. 04/19/18 on evaluation today patient's wound actually appears to be showing signs of epithelialization around the edge of the wound and in general actually appears to be doing better which is good news. He did have the same burning after about three days with applying the Endoform last week in the same fashion that I would generally apply a skin substitute. This seems to indicate that it's not the oasis to cause the problem but potentially the moisture buildup that just causes things to burn or there may be some other reaction with the skin prep or Steri-Strips. Nonetheless I'm not sure that is gonna be able to tolerate any skin substitute for a long period of time. The good news is the wound actually appears to be doing better today compared to last week and does seem to finally be making some progress. 04/26/18 on evaluation today patient actually appears to be doing rather well in regard to his ulcer in the right gluteal fold. He has been tolerating the dressing changes without complication which is good news. The Endoform does seem to be helping although he was a little bit more  macerated this week. This seems to be an ongoing issue with fluid control at this point. Nonetheless I think we may be able to add something like Drawtex to help control the drainage. 05/03/18 on evaluation today patient appears to actually be doing better in regard to the overall appearance of his wound. He has been tolerating the dressing changes without complication. Fortunately there appears to be no evidence of infection at this time. I really feel like his wound has shown signs as of today of turning around last week I thought so as well and definitely he could be seen in this week's overall appearance and measurements. In general I'm very pleased with the fact that he finally seems to be making a steady but sure progress. The patient likewise is very pleased. 05/17/18 on evaluation today patient appears to be doing more poorly unfortunately in regard to his ulcer. He has been tolerating the dressing changes without complication. With that being said he tells me that in the past couple of days he and his wife have noticed that we did not seem to be doing quite as well is getting dark near the center. Subsequently upon evaluation today the wound actually does appear to be doing worse compared to previous. He has been tolerating the dressing changes otherwise and he states that he is not been sitting up anymore than he was in the past from what he tells me. Still he has continued to work he states "I'm tired of dealing with this and if I have to just go home and lay in the bed all the time that's what I'll do". Nonetheless I am concerned about the fact that this wound does appear to be deeper than what it was previous. 05/24/18 upon evaluation today patient actually presents after having been in the hospital due to what was presumed to be sepsis secondary to the wound infection. He had an elevated white blood cell count between 14 and 15. With that being said he does seem to be doing somewhat better  now. His wound still is giving him some trouble nonetheless and he is obviously concerned about the fact likely talked about that this does seem to go more deeply than previously noted. I did review his wound culture which showed evidence of Staphylococcus  aureus him and group B strep. Nonetheless he is on antibiotics, Levaquin, for this. Subsequently I did review his intake summary from the hospital as well. I also did look at the CT of the lumbar spine with contrast that was performed which showed no bone destruction to suggest lumbar disguises/osteomyelitis or sacral osteomyelitis. There was no paraspinal abscess. Nonetheless it appears this may have been more of just a soft tissue infection at this point which is good news. He still is ESTES, STREET. ([id number]) nonetheless concerned about the wound which again I think is completely reasonable considering everything he's been through recently. 05/31/18 on evaluation today on evaluation today patient actually appears to be showing signs of his wound be a little bit deeper than what I would like to see. Fortunately he does not show any signs of significant infection although his temperature was 99 today he states he's been checking this at home and has not been elevated. Nonetheless with the undermining that I'm seeing at this point I am becoming more concerned about the wound I do think that offloading is a key factor here that is preventing the speedy recovery at this point. There does not appear to be any evidence of again over infection noted. He's been using Santyl currently. 06/07/18 the patient presents today for follow-up evaluation regarding the left ulcer in the gluteal region. He has been tolerating the Wound VAC fairly well. He is obviously very frustrated with this he states that to mean is really getting in his way. There does not appear to be any evidence of infection at this time he does have a little bit of odor I do not  necessarily associate this with infection just something that we sometimes notice with Wound VAC therapy. With that being said I can definitely catch a tone of discontentment overall in the patient's demeanor today. This when he was previously in the hospital an CT scan was done of the lumbar region which did not reveal any signs of osteomyelitis. With that being said the pelvis in particular was not evaluated distinctly which means he could still have some osteonecrosis I. Nonetheless the Wound VAC was started on Thursday I do want to get this little bit more time before jumping to a CT scan of the pelvis although that is something that I might would recommend if were not see an improvement by that time. 06/14/18 on evaluation today patient actually appears to be doing about the same in regard to his right gluteal ulcer. Again he did have a CT scan of the lumbar spine unfortunately this did not include the pelvis. Nonetheless with the depth of the wound that I'm seeing today even despite the fact that I'm not seeing any evidence of overt cellulitis I believe there's a good chance that we may be dealing with osteomyelitis somewhere in the right Ischial region. No fevers, chills, nausea, or vomiting noted at this time. 06/21/18 on evaluation today patient actually appears to be doing about the same with regard to his wound. The tunnel at 6 o'clock really does not appear to be any deeper although it is a little bit wider. I think at this point you may want to start packing this with white phone. Unfortunately I have not got approval for the CT scan of the pelvis as of yet due to the fact that Medicare apparently has been denied it due to the diagnosis codes not being appropriate according to Medicare for the test requested. With that being said  the patient cannot have an MRI and therefore this is the only option that we have as far as testing is concerned. The patient has had infection and was on  antibiotics and been added code for cellulitis of the bottom to see if this will be appropriate for getting the test approved. Nonetheless I'm concerned about the infection have been spread deeper into the Ischial region. 06/28/18 on evaluation today patient actually appears to be doing rather well all things considered in regard to the right gluteal ulcer. He has been tolerating the dressing changes without complication. With that being said the Wound VAC he states does have to be replaced almost every day or at least reinforced unfortunately. Patient actually has his CT scan later this morning we should have the results by tomorrow. 07/05/18 on evaluation today patient presents for follow-up concerning his right Ischial ulcer. He did see the surgeon Dr. Lysle Pearl last week. They were actually very happy with him and felt like he spent a tremendous amount of time with them as far as discussing his situation was concerned. In the end Dr. Lysle Pearl did contact me as well and determine that he would not recommend any surgical intervention at this point as he felt like it would not be in the patient's best interest based on what he was seeing. He recommended a referral to infectious disease. Subsequently this is something that Dr. Ines Bloomer office is working on setting up for the patient. As far as evaluation today is concerned the patient's wound actually appears to be worse at this point. I am concerned about how things are progressing and specifically about infection. I do not feel like it's the deeper but the area of depth is definitely widening which does have me concerned. No fevers, chills, nausea, or vomiting noted at this time. I think that we do need initiate antibiotic therapy the patient has an allow allergy to amoxicillin/penicillin he states that he gets a rash since childhood. Nonetheless she's never had the issues with Catholics or cephalosporins in general but he is aware of. 07/27/18 on evaluation  today patient presents following admission to the hospital on 07/09/18. He was subsequently discharged on 07/20/18. On 07/15/18 the patient underwent irrigation and debridement was soft tissue biopsy and bone biopsy as well as placement of a Wound VAC in the OR by Dr. Celine Ahr. During the hospital course the patient was placed on a Wound VAC and recommended follow up with surgery in three weeks actually with Dr. Delaine Lame who is infectious disease. The patient was on vancomycin during the hospital course. 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 He also had a bone culture which revealed evidence of methicillin-resistant staph aureus. He is updated CT scan 07/09/18 reveals that he had progression of the which was performed on wound to breakdown down to the trochanter where he actually had irregularities there as well suggestive of osteomyelitis. This was a change just since 9 December when we last performed a CT scan. Obviously this one had gone downhill quite significantly and rapidly. At this point upon evaluation I feel like in general the patient's wound seems to be doing fairly well all things considered upon my evaluation today. Obviously this is larger and deeper than what I previously evaluated but at the same time he seems to be making some progress as far as the appearance of the granulation tissue is concerned. I'm happy in that regard. No fevers, chills, nausea, or vomiting noted at this  time. He is on IV vancomycin and Rocephin at the facility. He is currently in [facility]. 08/03/18 upon evaluation today patient's wound appears to be doing better in regard to the overall appearance at this point in time. Fortunately he's been tolerating the Wound VAC without complication and states that the facility has been taking excellent care of the wound site. Overall I see some [place] noted on the surface which I am going to attempt sharp debridement today of  but nonetheless other than this I feel like he's making progress. 08/09/18 on evaluation today patient's wound appears to be doing much better compared to even last week's evaluation. Do believe that the Wound VAC is been of great benefit for him. He has been tolerating the dressing changes that is the Wound VAC without any complication and he has excellent granulation noted currently. There is no need for sharp debridement at this point. 08/16/18 on evaluation today patient actually appears to be doing very well in regard to the wound in the right gluteal fold region. This is showing signs of progress and again appears to be very healthy which is excellent news. Fortunately there is no sign of active infection by way of odor or drainage at this point. Overall I'm very pleased with how things stand. He seems to be tolerating the Wound VAC without complication. 08/23/18 on evaluation today patient actually appears to be doing better in regard to his wound. He has been tolerating the Wound VAC without complication and in fact it has been collecting a significant amount of drainage which I think is good news especially considering how the wound appears. Fortunately there is no signs of infection at this time definitely nothing appears to be worse which is good news. He has not been started on [place]. ([id number]) the Bactrim and Flagyl that was recommended by Dr. Delaine Lame yet. I did actually contact her office this morning in order to check and see were things are that regard their gonna be calling me back. 08/30/18 on evaluation today patient actually appears to show signs of excellent improvement today compared to last evaluation. The undermining is getting much better the wound seems to be feeling quite nicely and I'm very pleased that the granulation in general. With that being said overall I feel like the patient has made excellent progress which is great news. No fevers, chills, nausea,  or vomiting noted at this time. 09/06/18 on evaluation today patient actually appears to be doing rather well in regard to his right gluteal ulcer. This is showing signs of improvement in overall I'm very pleased with how things seem to be progressing. The patient likewise is please. Overall I see no evidence of infection he is about to complete his oral antibiotic regimen which is the end of the antibiotics for him in just about three days. 09/13/18 on evaluation today patient's right Ischial ulcer appears to be showing signs of continued improvement which is excellent news. He's been tolerating the dressing changes without complication. Fortunately there's no signs of infection and the wound that seems to be doing very well. 09/28/18 on evaluation today patient appears to be doing rather well in regard to his right Ischial ulcer. He's been tolerating the Wound VAC without complication he knows there's much less drainage than there used to be this obviously is not a bad thing in my pinion. There's no evidence of infection despite the fact is but nothing about it now for several weeks. 10/04/18 on evaluation today  patient appears to be doing better in regard to his right Ischial wound. He has been tolerating the Wound VAC without complication and I do believe that the silver nitrate last week was beneficial for him. Fortunately overall there's no evidence of active infection at this time which is great news. No fevers, chills, nausea, or vomiting noted at this time. 10/11/18 on evaluation today patient actually appears to be doing rather well in regard to his Ischial ulcer. He's been tolerating the Wound VAC still without complication I feel like this is doing a good job. No fevers, chills, nausea, or vomiting noted at this time. 11/01/18 on evaluation today patient presents after having not been seen in our clinic for several weeks secondary to the fact that he was on evaluation today patient presents  after having not been seen in our clinic for several weeks secondary to the fact that he was in a skilled nursing facility which was on lockdown currently due to the covert 19 national emergency. Subsequently he was discharged from the facility on this past Friday and subsequently made an appointment to come in to see yesterday. Fortunately there's no signs of active infection at this time which is good news and overall he does seem to have made progress since I last saw. Overall I feel like things are progressing quite nicely. The patient is having no pain. 11/08/18 on evaluation today patient appears to be doing okay in regard to his right gluteal ulcer. He has been utilizing a Wound VAC home health this changing this at this point since he's home from the skilled nursing facility. Fortunately there's no signs of obvious active infection at this time. Unfortunately though there's no obvious active infection he is having some maceration and his wife states that when the sheets of the Wound VAC office on Sunday when it broke seal that he ended up having significant issues with some smell as well there concerned about the possibility of infection. Fortunately there's No fevers, chills, nausea, or vomiting noted at this time. 11/15/18 on evaluation today patient actually appears to be doing well in regard to his right gluteal ulcer. He has been tolerating the dressing changes without complication. Specifically the Wound VAC has been utilized up to this point. Fortunately there's no signs of infection and overall I feel like he has made progress even since last week when I last saw him. I'm actually fairly happy with the overall appearance although he does seem to have somewhat of a hyper granular overgrowth in the central portion of the wound which I think may require some sharp debridement to try flatness out possibly utilizing chemical cauterization following. 11/23/18 on evaluation today patient actually  appears to be doing very well in regard to his sacral ulcer. He seems to be showing signs of improvement with good granulation. With that being said he still has the small area of hyper granulation right in the central portion of the wound which I'm gonna likely utilize silver nitrate on today. Subsequently he also keeps having a leak at the 6 o'clock location which is unfortunate we may be able to help out with some suggestions to try to prevent this going forward. Fortunately there's no signs of active infection at this time. 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 He's been tolerating the dressing changes without complication. Fortunately there's no signs of active infection at this time. I've  been rather pleased with how things have progressed there still some evidence of pressure getting to the area with some redness right around the immediate wound opening. Nonetheless other than this I'm not seeing any significant complications or issues the wound is somewhat hyper granular. Upon discussing with the patient and his wife today I'm not sure that the wound is being packed to the base with the foam at this point. And if it's not been packed fully that may be part of the reason why is not seen as much improvement as far as the granulation from the base out. Again we do not want pack too tightly but we need some of the firm to get to the base of the wound. I discussed this with patient and his wife today. 12/06/18 on evaluation today patient appears to be doing well in regard to his right gluteal pressure ulcer. He's been tolerating the dressing changes without complication. Fortunately there's no signs of active infection. He still has some hyper granular tissue and I do think it would be appropriate to continue with the chemical cauterization as of today. 12/16/18 on evaluation today patient actually  appears to be doing okay in regard to his right gluteal ulcer. He is been tolerating the dressing changes without complication including the Wound VAC. Overall I feel like nothing seems to be worsening I do feel like that the hyper granulation buds in the central portion of the wound have improved to some degree with the silver nitrate. We will have to see how things continue to progress. 12/20/18 on evaluation today patient actually appears to be doing much worse in my pinion even compared to last week's evaluation. Unfortunately as opposed to showing any signs of improvement the areas of hyper granular tissue in the central portion of the wound seem to be getting worse. Subsequently the wound bed itself also seems to be getting deeper even compared to last week which is both unusual as well as concerning since prior he had been shown signs of improvement. Nonetheless I think that the issue could be that he's actually having some difficulty in issues with a deeper infection. There's no external signs of infection but nonetheless I am more worried about the internal, osteomyelitis, that could be restarting. He has not been on antibiotics for some time at this point. I think that it may be a good idea to go ahead and started back on an antibiotic therapy while we wait to see what the testing shows. DONAVYN, BENNINK ([id number]) 12/27/18 on evaluation today patient presents for follow-up concerning his left gluteal fold wound. Fortunately he appears to be doing well today. I did review the CT scan which was negative for any signs of osteomyelitis or acute abnormality this is excellent news. Overall I feel like the surface of the wound bed appears to be doing significantly better today compared to previously noted findings. There does not appear any signs of infection nor does he have any pain at this time. 01/03/19 on evaluation today patient actually appears to be doing quite well in regard to his ulcer.  Post debridement last week he really did not have too much bleeding which is good news. Fortunately today this seems to be doing some better but we still has some of the hyper granular tissue noted in the base of the wound which is gonna require sharp debridement today as well. Overall I'm pleased with how things seem to be progressing since we switched away from the Wound  VAC I think he is making some progress. 01/10/19 on evaluation today patient appears to be doing better in regard to his right gluteal fold ulcer. He has been tolerating the dressing changes without complication. The debridement to seem to be helping with current away some of the poor hyper granular tissue bugs throughout the region of his gluteal fold wound. He's been tolerating the dressing changes otherwise without complication which is great news. No fevers, chills, nausea, or vomiting noted at this time. 01/17/19 on evaluation today patient actually appears to be doing excellent in regard to his wound. He's been tolerating the dressing changes without complication. Fortunately there is no signs of active infection at this time which is great news. No fevers, chills, nausea, or vomiting noted at this time. 01/24/19 on evaluation today patient actually appears to be doing quite well with regard to his ulcer. He has been tolerating the dressing changes without complication. Fortunately there's no signs of active infection at this time. Overall been very pleased with the progress that he seems to be making currently. 01/31/19-Patient returns at 1 week with apparent similarity in dimensions to the wound, with no signs of infection, he has been changing dressings twice a day 02/08/19 upon evaluation today patient actually appears to be doing well with regard to his right Ischial ulcer. The wound is not appear to be quite as deep and seems to be making progress which is good news. With that being said I'm still reluctant to go back to the  Wound VAC at this point. He's been having to change the dressings twice a day which is a little bit much in my pinion from the wound care supplies standpoint. I think that possibly attempting to utilize extras orbit may be beneficial this may also help to prevent any additional breakdown secondary to fluid retention in the wound itself. The patient is in agreement with giving this a try. 02/15/19 on evaluation today patient actually appears to be doing decently well with regard to his ulcer in the right to gluteal fold location. He's been tolerating the dressing changes without complication. Fortunately there is no signs of active infection at this time. He is able to keep the current dressing in place more effectively for a day at a time whereas before he was having a changes to to three times a day. The actions or has been helpful in this regard. Fortunately there's no signs of anything getting worse and I do feel like he showing signs of good improvement with regard to the wound bed status. 02/22/2019 patient appears to be doing very well today with regard to his ulcer in the gluteal fold. Fortunately there is no signs of active infection and he has been tolerating the dressing changes without any complication. Overall extremely pleased with how things seem to be progressing. He has much less of the hyper granular projections within the wound these have slowly been debrided away and he seems to be doing well. The wound bed is more uniform. 03/01/19 on evaluation today patient appears to be doing unfortunately about the same in regard to his gluteal ulcer. He's been tolerating the dressing changes without complication. Fortunately there's no signs of active infection at this time. With that being said he continues to develop these hyper granular projections which I'm unsure of exactly what they are and why they are rising. Nonetheless I explained to the patient that I do believe it would be a good idea  for [place] to stand a  biopsy sample for pathology to see if that can shed any light on what exactly may be going on here. Fortunately I do not see any obvious signs of infection. With that being said the patient has had a little bit more drainage this week apparently compared to last week. 03/08/2019 on evaluation today patient actually appears to look somewhat better with regard to the appearance of his wound bed at this time. This is good news. Overall I am very pleased with how things seem to have progressed just in the past week with a switch to the [facility] dressing. I think that has been beneficial for him. With that being said at this time the patient is concerned about his biopsy that I sent off last week unfortunately I do not have that report as of yet. Nonetheless we have called to obtain this and hopefully will hear back from the lab later this morning. 03/15/19 on evaluation today patient's wound actually appears to be doing okay today with regard to the overall appearance of the wound bed. He has been tolerating the dressing changes without complication he still has hyper granular tissue noted but fortunately that seems to be minimal at this point compared to some of what we've seen in the past. Nonetheless I do think that he is still having some issues currently with some of this type of granulation the biopsy and since all showed nothing more than just evidence of granulation tissue. Therefore there really is nothing different to initiate or do at this point. 03/24/19 on evaluation today patient appears to be doing a little better with regard to his ulcer. He's been tolerating the dressing changes without complication. Fortunately there is no signs of active infection at this time. No fevers, chills, nausea, or vomiting noted at this time. I'm overall pleased with how things seem to be progressing. 03/29/2019 on evaluation today patient appears to be doing about the same in regard to his  ulcer in the right gluteal fold. Unfortunately he is not seeming to make a lot of progress and the wound is somewhat stalled. There is no signs of active infection externally but I am concerned about the possibility of infection continuing in the ischial location which previously he did have infection noted. Again is not able to have an MRI so probably our best option for testing for this would be a triple phase bone scan which will detect subtle changes in the bone more so than plain film x-rays. In the past they really have not been beneficial and in fact the CT scans even have been somewhat questionable at times. Nonetheless there is no signs of systemic infection which is at least good news but again his wound is not healing at all on the predicted schedule. 04/05/19 on evaluation today patient appears to be doing well all things considering with regard to his wound and the right gluteal fold. He actually has his triple bone scan scheduled for sometime in the next couple of days. With that being said I've also been looking to other possibilities of what could be causing hyper granular tissue were looking into the bone scan again for evaluating for the risk or possibility of infection deeper and I'm also AKILLES, GEIB. ([id number]) go ahead and see about obtaining a deep tissue culture today to send and see if there's any evidence of infection noted on culture. He's in agreement with that plan. 04/12/2019 on evaluation today patient presents for reevaluation here in our clinic  concerning ongoing issues with his right gluteal fold ulcer. 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 It was discussed with him at that point that I think it would be appropriate for him to proceed with hyperbaric oxygen therapy especially in light of the fact that he is previously been on IV  antibiotics at the beginning of the year for close to 3 months followed by several weeks of oral antibiotics that was all prescribed by infectious disease. He had surgical debridement around Christmas December 2019 due to an abscess and osteomyelitis of the ischial bone. Unfortunately this has not really proceeded as well as we would have liked and again we did a CT scan even a couple months ago as he cannot have an MRI secondary to having issues with both a pain pump as well as a spinal cord stimulator which prevent him from going into an MRI machine. With that being said there were chronic changes noted at the ischial tuberosity which had progressed since December 2019 there was no evidence of fluid collection on that initial CT scan. With that being said that was on January 21, 2019. I am not sure that it was a sensitive enough test as compared to an MRI and then subsequently I ordered a bone scan for him which was actually completed on 03/29/2019 and this revealed that he does indeed have positive osteomyelitis involving the right ischial tuberosity. This is adjacent to the ulcer and I think is the reason that his ulcer is not healing. Subsequently I am in a place him back on oral antibiotics today unfortunately his wound culture showed just mixed gram-negative flora with no specific findings of a predominant organism. Nonetheless being with the location I think a good broad-spectrum antibiotic for gram-negative's is a good choice at this point he is previously taken Levaquin without significant issues and I think that is an appropriate antibiotic for him at this time. 04/19/2019 on evaluation today patient actually appears to be doing fairly well with regard to his wound. He has been tolerating the dressing changes without complication. Fortunately there is no signs of active infection and he has been taking the oral antibiotics at this time. Subsequently we did make a referral to infectious disease  although Dr. Steva Ready wants the patient to be seen by general surgery first in order apparently to see if there is anything from a surgical standpoint that should be done prior to initiation of IV antibiotic therapy. Again the patient is okay with actually seeing Dr. Celine Ahr whom he has seen before we will make that referral. 04/26/2019 on evaluation today patient actually appears to be doing well with regard to his wound. He has been tolerating the dressing changes without complication. Fortunately there is no signs of active infection at this time. I do believe that the hyperbaric oxygen therapy along with the antibiotics which I prescribed at this point have been doing well for him. With that being said he has not seen Dr. Celine Ahr the surgeon yet in fact they have not been contacted for scheduling an appointment as of yet. Subsequently the patient is not on antibiotics currently by IV Dr. Steva Ready did want him to see Dr. Celine Ahr first which we are working on trying to get scheduled. We again give the information to the patient today for Dr. Celine Ahr and her number as far as contacting their office to see  about getting something scheduled. Again were looking for whether or not they recommend any surgical intervention. 05/03/2019 on evaluation today patient appears to be doing about the same with regard to his wound there may be a little bit of filling in in regard to the base of the wound but still he has quite a bit of hyper granular tissue that I am seeing today. I believe that he may may need a more aggressive sharp debridement possibly even by the surgeon or under anesthesia in order to clear away some of the hyper granular material in order to help allow for appropriate granulation to fill in. We have made a referral to Dr. Celine Ahr unfortunately it sounds as if they did not receive the referral we contacted them today they should be get in touch with the patient. Depending on how things show from  that standpoint the patient may need to see Dr. Ardyth Gal who is the infectious disease specialist although he really does not want a PICC line again. No fevers, chills, nausea, vomiting, or diarrhea. He is tolerating hyperbaric oxygen therapy very well. 05/12/2019 on evaluation today patient presents for follow-up visit concerning his right gluteal fold ulcer. He did see Dr. Celine Ahr the surgeon who previously evaluated his wound and she actually felt like he was doing quite well with regard to the wound based on what she was seen. He does seem to be responding to some degree with the oral antibiotics along with hyperbaric oxygen therapy at this point she did not see any evidence or need for further surgical intervention at this point. She recommended deferring additional or ongoing antibiotic therapy to Dr. Steva Ready at her discretion. Fortunately there is no signs of active systemic infection. No fevers, chills, nausea, vomiting, or diarrhea. 10/29; patient I am seeing really for the first time today in terms of his wound. 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 He is being treated with hyperbaric oxygen for underlying osteomyelitis most recently documented by a three-phase bone scan. He has been on Levaquin for roughly a month and he is out of these and asked me for a refill. Most recent cultures were negative although I do not think these were bone cultures. He has a very irregular surface to this wound which is almost nodular in texture. It undermines superiorly with the same nodular hyper granulated surface. I see that he was referred to general surgery for an operative debridement although the surgeon did not agree with this I think that would have been the proper course of action in this. He has been using Hydrofera Blue. He is supposed to start a wound VAC tomorrow which is actually a reapplication of wound VAC. I think mostly last time they had trouble keeping the  seal in place I hope we have better look at this this time. 05/24/2019 on evaluation today I did review patient's note from Dr. Dellia Nims where he was seen last week when I was out of the office. Subsequently it does appear that Dr. Dellia Nims was in agreement that the patient really needed debridement to clear away some of the nodular tissue in the base of the wound. The good news is he has the wound VAC initiated and some of this tissue is already starting to breakdown under the treatment of the wound VAC again because it is not really viable. Nonetheless I do believe that we can likely perform some debridement today to clear this away and hopefully the continuation of the  wound VAC along with hyperbarics and antibiotic therapy will be beneficial in stopping this from reoccurring. If we get better control in the wound bed in a good spot I think we can definitely use the PriMatrix which we actually did get approval for. 05/31/2019 on evaluation today patient appears to be doing actually pretty well at this point with regard to his wound. He has been tolerating the dressing changes which is good news. Fortunately there is no signs of infection. He still has some of the hyper granular nodules noted we have not really cleared all these away and I think we can try to do a little bit more that today. I did do quite a bit last week I am hoping to get down to the base of the wound so though actually be able to heal more appropriately. I think the wound VAC is doing a good job right now with controlling the drainage and overall the patient is happy with that. 06/07/2019 upon evaluation today patient appears to be doing quite well compared to last week with regard to the hyper granulation in the base of the wound. He has been tolerating the dressing changes without complication. Fortunately there is no signs of active infection at this time. With that being said we have not had any recent measures as far as blood work  is concerned I think we may want to check a few things including a CBC, sed rate, and C-reactive protein. Patient is in agreement with that we can try to get that scheduled for him for this Friday. 06/13/2019 on evaluation today patient actually appears to be doing much better at this point based on what I am seeing with regard to his wound. He JOAQUIN, LINARES. ([id number]) has much less in the way of hyper granular tissue which is good news and a lot more healing that is taken place since I last saw him as far as the amount of space within the wound itself. With that being said he is nearing the completion of the initial 40 treatments for hyperbaric oxygen therapy I think this is something I would recommend extending as he does seem to be benefiting at this time. Fortunately there is no evidence of active systemic infection at this point nor even local infection for that matter. 06/20/2019 upon evaluation today patient actually appears to be doing well with regard to his wound. In fact this appears to be doing much better today compared to what it was previous. I been extremely pleased with the progress that he is made. In fact the tunnel is much less deep today than what it was in the past and I am seeing excellent signs of improvement. Overall I am happy with how things are going. I did review his white blood cell count which revealed everything to be okay. The one abnormal finding on his CBC was a hemoglobin of 12.7. Subsequently I did also review his sed rate and C-reactive protein. His sed rate measured in at normal level measuring at 26. The C-reactive protein however was elevated today and an abnormal range indicating inflammation. Still I feel like he is trending in a good direction at this time. He is going to need a refill of the Levaquin he tells me at this point. 07/04/2019 on evaluation today patient actually appears to be doing well in regard to his wound. He has been tolerating the  dressing changes without complication. Fortunately there is no signs of active infection which is good news.  No fevers, chills, nausea, vomiting, or diarrhea. The good news also is that I did review his labs and though his sed rate was slightly elevated the C-reactive protein was actually down compared to the last evaluation 2 weeks ago this is excellent news I think it is showing signs of improvement. 07/11/2019 on evaluation today patient actually appears to be doing quite well with regard to his wound on my evaluation. This is very slow going but nonetheless he seems to be making progress. Especially in regard to the depth. Nonetheless I believe that we may want to consider going forward with the PriMatrix which was previously approved for him. We will get a have to get a new approval as we will actually be applying this in the new year so we will likely obtain that approval on January 4. In the meantime and for the time being my suggestion is going to be that we go ahead and continue with the wound VAC and we were going at this point with the current wound care measures. 12/28-Patient seen after HBO session today, wound depth with the tunnel measures 3.2 cm, the wound bed appears healthy, continuing with the VAC and HBO 07/25/2019 on evaluation today patient's wound appears to be doing about the same compared to my last evaluation. Fortunately there is no evidence of significant infection which is good news we will still waiting on the results of his lab work which unfortunately was not done prior to today's visit which is what we were aiming for. Ultimately there is no sign of active infection at this time which is good news. And he also tells me that the wound VAC has been staying in place and doing fairly well which is also good news. With that being said I am concerned about the fact that the wound has not progressed as much I think we may want to look into getting reapproval for the skin  substitute, specifically PriMatrix, that I am hopeful will help to allow this area to granulate in more effectively. I would recommend as well continuing with the wound VAC along with this. 08/01/2019 on evaluation today patient appears to be doing in my opinion slightly better in regard to the wound. He actually does have some tightening of the walls around the edges which is a good thing. Fortunately there is no signs of active infection at this time. No fever chills noted. I do believe that the patient in general seems to be making progress although is very slow. Obviously were trying to get things to speed up somewhat more. No fevers, chills, nausea, vomiting, or diarrhea. 08/08/2019 upon evaluation today patient appears to be doing well with regard to his wound although there is not a significant improvement overall we do have the PriMatrix for availability today to apply. Fortunately there is no signs of active infection and the patient overall seems to be doing well. There is really no need for aggressive sharp debridement at this point either which is also good news. I do believe her get a need to suture and the primary checks in the basin in the PriMatrix in order to keep it in place at the base of the wound underneath the wound VAC. This was discussed with the patient today as well. He is in agreement the plan. He really does not have any filling in the area and so this should not be a complication as far as suturing is concerned. 08/15/2019 on evaluation today patient appears to be doing  somewhat better in my opinion in regard to the overall appearance of his wound. He has been tolerating the dressing changes without complication. Fortunately the skin subappear to still be in place when this was changed on Friday. With that being said it appears that the PriMatrix may have dissolved in the interim between now and then as the sutures are no longer holding anything in place and again I would  sutured it into place. Nonetheless I believe that we can go ahead and likely consider going forward that we may need to apply the PriMatrix weekly as opposed every other week as it does seem to have dissolved rather readily and well. Overall the wound bed appears to be doing excellent at this time. 08/22/2019 upon evaluation today the patient does seem to making some progress with regard to his wound. Overall there is some better quality tissue and less of the hyper granular protrusions all of which is good news. With that being said we do have the PriMatrix available for application today and I am in a try to find a better way to suture this in place for him today. 08/29/2019 on evaluation today patient appears to be doing very well in regard to his wound. I do see signs of new granulation which seems to be somewhat helpful he did have some need for sharp debridement today to clear some of the hyper granulation away but nonetheless I feel like we are headed in a good direction. Fortunately there is no evidence of any systemic infection although we are going to go ahead and reorder the CBC, C-reactive protein, and sed rate today to see where things stand I did just refill his Levaquin as well. 09/05/2019 upon evaluation today patient's wound actually is not appearing to be showing signs of significant improvement compared to what we have seen previous from the standpoint of depth. He overall wound size wise seems to be about the same and again some of the quality of the wound bed is still doing fairly well. I did review his labs and his CBC was normal with no signs of elevation white blood cell count, his C-reactive protein was normal, and the sed rate was slightly elevated but again I think this may just be due to ulceration really there is nothing significant to indicate severe infection. I think when he likely finishes his current round of oral antibiotics with Levaquin will likely take him off of the  medication at that point based on the findings. I do feel like the hyperbarics helped in getting the infection under control as well as improving the overall size of the wound quite significantly. With that being said at this point I really feel like more more at the time frame that he may benefit from a muscle flap in order to try to help this wound heal effectively. I think that this would be the fastest way in order to do this. In the past he has been somewhat resistant to going down that path but nonetheless I feel like he likely should. ARACELI, ORVIS ([id number]) 09/12/2019 on evaluation today patient appears to be doing fairly well in regard to his wound. He is tolerating the wound VAC without complication is still continues to drain significantly. Fortunately there is no signs of active infection at this time which is great news his lab work is also been doing very well. With that being said I do think at this point that the patient is still deriving benefit from the  wound VAC especially in regard to controlling moisture which I think it is doing fairly well. He also has some improvement in the overall wound bed and on the medial aspect of the wound there are some hyper granular projections but nothing as severe as what they have been in the past. I am good have to perform some sharp debridement at this location today. Electronic Signature(s) Signed: 09/12/2019 8:54:51 AM By: Worthy Keeler [name] Entered By: Worthy Keeler on 09/12/2019 08:54:51 TRISHA, CAMPE ([id number]) -------------------------------------------------------------------------------- Physical Exam Details Patient Name: [name] Date of Service: 09/12/2019 8:00 AM Medical Record Number: [id number] Patient Account Number: [id number] Date of Birth/Sex: [birth year] (68 y.o. M) Treating RN: Primary Care Provider: [name] Other Clinician: Referring Provider: [name] Treating  Provider/Extender: [name], Emalia Witkop Weeks in Treatment: 12 Constitutional Well-nourished and well-hydrated in no acute distress. Respiratory normal breathing without difficulty. Psychiatric this patient is able to make decisions and demonstrates good insight into disease process. Alert and Oriented x 3. pleasant and cooperative. Notes Patient's wound bed currently did require sharp debridement to remove hyper granular projections from the surface of the wound which he tolerated well without complication and post debridement the wound bed appears to be doing significantly better which is great news. Overall I do feel like we are seeing improvement although this is very slow were trying to look into getting the patient in with plastic surgery for the possibility of a skin flap. They required an albumin and prealbumin test prior to seeing the patient we have placed the order for that that should be completed today. Electronic Signature(s) Signed: 09/12/2019 8:55:42 AM By: Worthy Keeler [name] Entered By: Worthy Keeler on 09/12/2019 08:55:41 KINGSTON, MEEHAN ([id number]) -------------------------------------------------------------------------------- Physician Orders Details Patient Name: [name] Date of Service: 09/12/2019 8:00 AM Medical Record Number: [id number] Patient Account Number: [id number] Date of Birth/Sex: 03-12-1951 (68 y.o. 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 - in office o Cleanse wound with mild soap and water Anesthetic (add to Medication List) Wound #1 Right Gluteal fold o Topical Lidocaine 4% cream applied to wound bed prior to debridement (In Clinic Only). Skin Barriers/Peri-Wound Care o Skin Prep Primary Wound Dressing Wound #1 Right Gluteal fold o Saline moistened gauze - in clinic only Secondary Dressing Wound #1 Right Gluteal fold o Boardered Foam Dressing - In Clinic Dressing Change Frequency Wound #1 Right Gluteal fold o Change Dressing Monday, Wednesday, Friday Follow-up Appointments Wound #1 Right Gluteal fold o Return Appointment in 1 week. Off-Loading Wound #1 Right Gluteal fold o Turn and reposition every 2 hours - no pressure on wounded area Home Health Wound #1 Right Gluteal fold o Continue Home Health Visits - [facility]: Home Health to Draw labs: Albumin and Pre-albumin and fax to [phone]. o Home Health Nurse may visit PRN to address patientos wound care needs. o FACE TO FACE ENCOUNTER: MEDICARE and MEDICAID PATIENTS: I certify that this patient is under my care and that I had a face-to- face encounter that meets the  physician face-to-face encounter requirements with this patient on this date. The encounter with the patient was in whole or in part for the following MEDICAL CONDITION: (primary reason for [place]) MEDICAL NECESSITY: I certify, that based on my findings, NURSING services are a medically necessary home health service. HOME BOUND STATUS: I EDWARD, RUCKI ([id number]) certify that my clinical findings support that this patient is homebound (i.e., Due to illness or injury, pt requires aid of supportive devices such as crutches, cane, wheelchairs, walkers, the use of special transportation or the assistance of another person to leave their place of residence. There is a normal inability to leave  the home and doing so requires considerable and taxing effort. Other absences are for medical reasons / religious services and are infrequent or of short duration when for other reasons). o If current dressing causes regression in wound condition, may D/C ordered dressing product/s and apply Normal Saline Moist Dressing daily until next [place] / Other MD appointment. [place] of regression in wound condition at [phone]. o Please direct any NON-WOUND related issues/requests for orders to patient's Primary Care Physician Negative Pressure Wound Therapy Wound #1 Right Gluteal fold o Wound VAC settings at 125/130 mmHg continuous pressure. Use BLACK/GREEN foam to wound cavity. Use WHITE foam to fill any tunnel/s and/or undermining. Change VAC dressing 3 X WEEK. Change canister as indicated when full. Nurse may titrate settings and frequency of dressing changes as clinically indicated. - Apply wound vac as normal o Home Health Nurse may d/c VAC for s/s of increased infection, significant wound regression, or uncontrolled drainage. [place] at [phone]. o Apply contact layer over base of wound. - contact layer over sutured Primatrix in tunnel at 12:00. o Number of foam/gauze pieces used in the dressing = Medications-please add to medication list. Wound #1 Right Gluteal fold o P.O. Antibiotics - Complete antibiotics. Electronic Signature(s) Signed: 09/12/2019 5:29:33 PM By: Worthy Keeler [name] Signed: 09/23/2019 5:40:44 PM By: Gretta Cool BSN, RN, CWS, Kim RN, BSN Entered By: Gretta Cool, BSN, RN, CWS, Kim on 09/12/2019 08:33:13 ANZIO, OREJEL ([id number]) -------------------------------------------------------------------------------- Problem List Details Patient Name: [name] Date of Service: 09/12/2019 8:00 AM Medical Record Number: [id number] Patient Account Number: [id number] Date of Birth/Sex: 05/17/1951 (68 y.o. 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 He states that there is no history of diabetes that he is aware of. He does have issues with his prostate and is currently receiving treatment for this by way of oral medication. With that being said I do not have a lot of details in  that regard. Nonetheless the patient presents today as a result of having been referred to [place] by another provider initially home health was set to come out and take care of his wound although due to the fact that he apparently drives he's not able to receive home health. His wife is therefore trying to help take care of this wound within although they have been struggling with what exactly to do at this point. She states that she can do some things but she is definitely not a nurse and does have some issues with looking at blood. The good news is the wound does not appear to be too deep and is fairly superficial at this point. There is no slough noted there is some nonviable skin noted around the surface of the wound and the perimeter at this point. The central portion of the wound appears to be very good with a dermal layer noted this does not appear to be again deep enough to extend it to subcutaneous tissue at this point. 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 In the pump he has Dilaudid and baclofen. 12/07/17 on evaluation today patient presents for follow-up concerning his ongoing lower back thigh ulcer on the right. He states that he did not get the supplies ordered and therefore has not really been able to perform the dressing changes as directed exactly. His wife was able to get some Boarder Foam Dressing's from the drugstore and subsequently has been using hydrogel which did help to a degree in the wound does appear to be able smaller. There is actually more drainage this week noted than previous. 12/21/17 on evaluation today patient appears to be doing rather well in regard to his right gluteal ulcer. He has been tolerating the dressing changes without complication. There does not appear to be any evidence of infection at this point in time. Overall the wound does seem to be making  some progress as far as the edges are concerned there's not as much in the way of overlapping of the external wound edges and he has a good epithelium to wound bed border for the most part. This however is not true right at the 12 o'clock location over the span of a little over a centimeters which actually will require debridement today to clean this away and hopefully allow it to continue to heal more appropriately. 12/28/17 on evaluation today patient appears to be doing rather well in regard to his ulcer in the left gluteal region. He's been tolerating the dressing changes without complication. Apparently he has had some difficulty getting his dressing material. Apparently there's been some confusion with ordering we're gonna check into this. Nonetheless overall he's been showing signs of improvement which is good news. Debridement is not required today. 01/04/18 on evaluation today patient presents for follow-up concerning his right gluteal ulcer. He has been tolerating the dressing changes fairly well. On inspection today it appears he may actually have some maceration them concerned about the fact  that he may be developing too much moisture in and around the wound bed which can cause delay in healing. With that being said he unfortunately really has not showed significant signs of improvement since last week's evaluation in fact this may even be just the little bit/slightly larger. Nonetheless he's been having a lot of discomfort I'm not sure this is even related to the wound as he has no pain when I'm to breeding or otherwise cleaning the wound during evaluation today. Nonetheless this is something that we did recommend he talked to his pain specialist concerning. 01/11/18 on evaluation today patient appears to be doing better in regard to his ulceration. He has been tolerating the dressing changes without complication. With that being said overall there's no evidence of infection which is good  news. The only thing is he did receive the hatch affair blue classic versus the ready nonetheless I feel like this is perfectly fine and appears to have done well for him over the past week. 01/25/18 on evaluation today patient's wound actually appears to be a little bit larger than during the last evaluation. The good news is the majority of the wound edges actually appear to be fairly firmly attached to the wound bed unfortunately again we're not really making progress in regard to the size. Roughly the wound is about the same size as when I first saw him although again the wound margin/edges appear to be much better. 02/01/18 on evaluation today patient actually appears to be doing very well in regard to his wound. Applying the Prisma dry does seem to be better although he does still have issues with slow progression of the wound. There was a slight improvement compared to last week's measurements today. Nonetheless I have been considering other options as far as the possibility of Theraskin or even a snap vac. In general I'm not sure that the Theraskin due to location of the wound would be a very good idea. Nonetheless I do think that a snap vac could be a possibility for the patient and in fact I think this could even be an excellent way to manage the wound possibly seeing some improvement in a very rapid fashion here. Nonetheless this is something that we would need to get approved and I did have a lengthy conversation with the patient about this today. 02/08/18 on evaluation today patient appears to be doing a little better in regard to his ulcer. He has been tolerating the dressing changes without complication. Fortunately despite the fact that the wound is a little bit smaller it's not significantly so unfortunately. We have discussed the possibility of a snap vac we did check with insurance this is actually covered at this point. Fortunately there does not appear to be any sign of infection.  Overall I'm AAGAM, GOOTEE. ([id number]) fairly pleased with how things seem to be appearing at this point. 02/15/18 on evaluation today patient appears to be doing rather well in regard to his right gluteal ulcer. Unfortunately the snap vac did not stay in place with his sheer and friction this came loose and did not seem to maintain seal very well. He worked for about two days and it did seem to do very well during that time according to his wife but in general this does not seem to be something that's gonna be beneficial for him long-term. I do believe we need to go back to standard dressings to see if we can find something that will be of  benefit. 03/02/18- He is here in follow up evaluation; there is minimal change in the wound. He will continue with the same treatment plan, would consider changing to iodosrob/iodoflex if ulcer continues to to plateau. He will follow up next week 03/08/18 on evaluation today patient's wound actually appears to be about the same size as when I previously saw him several weeks back. Unfortunately he does have some slightly dark discoloration in the central portion of the wound which has me concerned about pressure injury. I do believe he may be sitting for too long a period of time in fact he tells me that "I probably sit for much too long". He does have some Slough noted on the surface of the wound and again as far as the size of the wound is concerned I'm really not seeing anything that seems to have improved significantly. 03/15/18 on evaluation today patient appears to be doing fairly well in regard to his ulcer. The wound measured pretty much about the same today compared to last week's evaluation when looking at his graph. With that being said the area of bruising/deep tissue injury that was noted last week I do not see at this point. He did get a new cushion fortunately this does seem to be have been of benefit in my pinion. It does appear that he's been off  of this more which is good news as well I think that is definitely showing in the overall wound measurements. With that being said I do believe that he needs to continue to offload I don't think that the fact this is doing better should be or is going to allow him to not have to offload and explain this to him as well. Overall he seems to be in agreement the plan I think he understands. The overall appearance of the wound bed is improved compared to last week I think the Iodoflex has been beneficial in that regard. 03/29/18 on evaluation today patient actually appears to be doing rather well in regard to his wound from the overall appearance standpoint he does have some granulation although there's some Slough on the surface of the wound noted as well. With that being said he unfortunately has not improved in regard to the overall measurement of the wound in volume or in size. I did have a discussion with him very specifically about offloading today. He actually does work although he mainly is just sitting throughout the day. He tells me he offloads by "lifting himself up for 30 seconds off of his chair occasionally" purchase from [employer] which does seem to have helped. And he has a new cushion that he with that being said he's also able to stand some for a very short period of time but not significant enough I think to provide appropriate offloading. I think the biggest issue at this point with the wound and the fact is not healing as quickly as we would like is due to the fact that he is really not able to appropriately offload while at work. He states the beginning after his injury he actually had a bed at his job that he could lay on in order to offload and that does seem to have been of help back at that time. Nonetheless he had not done this in quite some time unfortunately. I think that could be helpful for him this is something I would like for him to look into. 04/05/18 on evaluation  today patient actually presents for follow-up concerning  his right gluteal ulcer. Again he really is not significantly improved even compared to last week. He has been tolerating the dressing changes without complication. With that being said fortunately there appears to be no evidence of infection at this time. He has been more proactive in trying to offload. 04/12/18 on evaluation today patient actually appears to be doing a little better in regard to his wound and the right gluteal fold region. He's been tolerating the dressing changes since removing the oasis without complication. However he was having a lot of burning initially with the oasis in place. He's unsure of exactly why this was given so much discomfort but he assumes that it was the oasis itself causing the problem. Nonetheless this had to be removed after about three days in place although even those three days seem to have made a fairly good improvement in regard to the overall appearance of the wound bed. In fact is the first time that he's made any improvement from the standpoint of measurements in about six weeks. He continues to have no discomfort over the area of the wound itself which leads me to wonder why he was having the burning with the oasis when he does not even feel the actual debridement's themselves. I am somewhat perplexed by this. 04/19/18 on evaluation today patient's wound actually appears to be showing signs of epithelialization around the edge of the wound and in general actually appears to be doing better which is good news. He did have the same burning after about three days with applying the Endoform last week in the same fashion that I would generally apply a skin substitute. This seems to indicate that it's not the oasis to cause the problem but potentially the moisture buildup that just causes things to burn or there may be some other reaction with the skin prep or Steri-Strips. Nonetheless I'm not sure that  is gonna be able to tolerate any skin substitute for a long period of time. The good news is the wound actually appears to be doing better today compared to last week and does seem to finally be making some progress. 04/26/18 on evaluation today patient actually appears to be doing rather well in regard to his ulcer in the right gluteal fold. He has been tolerating the dressing changes without complication which is good news. The Endoform does seem to be helping although he was a little bit more macerated this week. This seems to be an ongoing issue with fluid control at this point. Nonetheless I think we may be able to add something like Drawtex to help control the drainage. 05/03/18 on evaluation today patient appears to actually be doing better in regard to the overall appearance of his wound. He has been tolerating the dressing changes without complication. Fortunately there appears to be no evidence of infection at this time. I really feel like his wound has shown signs as of today of turning around last week I thought so as well and definitely he could be seen in this week's overall appearance and measurements. In general I'm very pleased with the fact that he finally seems to be making a steady but sure progress. The patient likewise is very pleased. 05/17/18 on evaluation today patient appears to be doing more poorly unfortunately in regard to his ulcer. He has been tolerating the dressing changes without complication. With that being said he tells me that in the past couple of days he and his wife have noticed that we did not  seem to be doing quite as well is getting dark near the center. Subsequently upon evaluation today the wound actually does appear to be doing worse compared to previous. He has been tolerating the dressing changes otherwise and he states that he is not been sitting up anymore than he was in the past from what he tells me. Still he has continued to work he states "I'm  tired of dealing with this and if I have to just go home and lay in the bed all the time that's what I'll do". Nonetheless I am concerned about the fact that this wound does appear to be deeper than what it was previous. 05/24/18 upon evaluation today patient actually presents after having been in the hospital due to what was presumed to be sepsis secondary to the wound infection. He had an elevated white blood cell count between 14 and 15. With that being said he does seem to be doing somewhat better now. RAYMEL, VANDONGEN ([id number]) His wound still is giving him some trouble nonetheless and he is obviously concerned about the fact likely talked about that this does seem to go more deeply than previously noted. I did review his wound culture which showed evidence of Staphylococcus aureus him and group B strep. Nonetheless he is on antibiotics, Levaquin, for this. Subsequently I did review his intake summary from the hospital as well. I also did look at the CT of the lumbar spine with contrast that was performed which showed no bone destruction to suggest lumbar disguises/osteomyelitis or sacral osteomyelitis. There was no paraspinal abscess. Nonetheless it appears this may have been more of just a soft tissue infection at this point which is good news. He still is nonetheless concerned about the wound which again I think is completely reasonable considering everything he's been through recently. 05/31/18 on evaluation today on evaluation today patient actually appears to be showing signs of his wound be a little bit deeper than what I would like to see. Fortunately he does not show any signs of significant infection although his temperature was 99 today he states he's been checking this at home and has not been elevated. Nonetheless with the undermining that I'm seeing at this point I am becoming more concerned about the wound I do think that offloading is a key factor here that is preventing the  speedy recovery at this point. There does not appear to be any evidence of again over infection noted. He's been using Santyl currently. 06/07/18 the patient presents today for follow-up evaluation regarding the left ulcer in the gluteal region. He has been tolerating the Wound VAC fairly well. He is obviously very frustrated with this he states that to mean is really getting in his way. There does not appear to be any evidence of infection at this time he does have a little bit of odor I do not necessarily associate this with infection just something that we sometimes notice with Wound VAC therapy. With that being said I can definitely catch a tone of discontentment overall in the patient's demeanor today. This when he was previously in the hospital an CT scan was done of the lumbar region which did not reveal any signs of osteomyelitis. With that being said the pelvis in particular was not evaluated distinctly which means he could still have some osteonecrosis I. Nonetheless the Wound VAC was started on Thursday I do want to get this little bit more time before jumping to a CT scan of the pelvis although  that is something that I might would recommend if were not see an improvement by that time. 06/14/18 on evaluation today patient actually appears to be doing about the same in regard to his right gluteal ulcer. Again he did have a CT scan of the lumbar spine unfortunately this did not include the pelvis. Nonetheless with the depth of the wound that I'm seeing today even despite the fact that I'm not seeing any evidence of overt cellulitis I believe there's a good chance that we may be dealing with osteomyelitis somewhere in the right Ischial region. No fevers, chills, nausea, or vomiting noted at this time. 06/21/18 on evaluation today patient actually appears to be doing about the same with regard to his wound. The tunnel at 6 o'clock really does not appear to be any deeper although it is a little  bit wider. I think at this point you may want to start packing this with white phone. Unfortunately I have not got approval for the CT scan of the pelvis as of yet due to the fact that Medicare apparently has been denied it due to the diagnosis codes not being appropriate according to Medicare for the test requested. With that being said the patient cannot have an MRI and therefore this is the only option that we have as far as testing is concerned. The patient has had infection and was on antibiotics and been added code for cellulitis of the bottom to see if this will be appropriate for getting the test approved. Nonetheless I'm concerned about the infection have been spread deeper into the Ischial region. 06/28/18 on evaluation today patient actually appears to be doing rather well all things considered in regard to the right gluteal ulcer. He has been tolerating the dressing changes without complication. With that being said the Wound VAC he states does have to be replaced almost every day or at least reinforced unfortunately. Patient actually has his CT scan later this morning we should have the results by tomorrow. 07/05/18 on evaluation today patient presents for follow-up concerning his right Ischial ulcer. He did see the surgeon Dr. Lysle Pearl last week. They were actually very happy with him and felt like he spent a tremendous amount of time with them as far as discussing his situation was concerned. In the end Dr. Lysle Pearl did contact me as well and determine that he would not recommend any surgical intervention at this point as he felt like it would not be in the patient's best interest based on what he was seeing. He recommended a referral to infectious disease. Subsequently this is something that Dr. Ines Bloomer office is working on setting up for the patient. As far as evaluation today is concerned the patient's wound actually appears to be worse at this point. I am concerned about how things are  progressing and specifically about infection. I do not feel like it's the deeper but the area of depth is definitely widening which does have me concerned. No fevers, chills, nausea, or vomiting noted at this time. I think that we do need initiate antibiotic therapy the patient has an allow allergy to amoxicillin/penicillin he states that he gets a rash since childhood. Nonetheless she's never had the issues with Catholics or cephalosporins in general but he is aware of. 07/27/18 on evaluation today patient presents following admission to the hospital on 07/09/18. He was subsequently discharged on 07/20/18. On 07/15/18 the patient underwent irrigation and debridement was soft tissue biopsy and bone biopsy as well as placement  of a Wound VAC in the OR by Dr. Celine Ahr. During the hospital course the patient was placed on a Wound VAC and recommended follow up with surgery in three weeks actually with Dr. Delaine Lame who is infectious disease. The patient was on vancomycin during the hospital course. 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 He also had a bone culture which revealed evidence of methicillin-resistant staph aureus. He is updated CT scan 07/09/18 reveals that he had progression of the which was performed on wound to breakdown down to the trochanter where he actually had irregularities there as well suggestive of osteomyelitis. This was a change just since 9 December when we last performed a CT scan. Obviously this one had gone downhill quite significantly and rapidly. At this point upon evaluation I feel like in general the patient's wound seems to be doing fairly well all things considered upon my evaluation today. Obviously this is larger and deeper than what I previously evaluated but at the same time he seems to be making some progress as far as the appearance of the granulation tissue is concerned. I'm happy in that regard. No fevers, chills, nausea, or  vomiting noted at this time. He is on IV vancomycin and Rocephin at the facility. He is currently in [facility]. 08/03/18 upon evaluation today patient's wound appears to be doing better in regard to the overall appearance at this point in time. Fortunately he's been tolerating the Wound VAC without complication and states that the facility has been taking excellent care of the wound site. Overall I see some Slough noted on the surface which I am going to attempt sharp debridement today of but nonetheless other than this I feel like he's making progress. 08/09/18 on evaluation today patient's wound appears to be doing much better compared to even last week's evaluation. Do believe that the Wound VAC is been of great benefit for him. He has been tolerating the dressing changes that is the Wound VAC without any complication and he has excellent granulation noted currently. There is no need for sharp debridement at this point. 08/16/18 on evaluation today patient actually appears to be doing very well in regard to the wound in the right gluteal fold region. This is showing signs of progress and again appears to be very healthy which is excellent news. Fortunately there is no sign of active infection by way of odor or drainage at BRIAN, GIUSTINO. ([id number]) this point. Overall I'm very pleased with how things stand. He seems to be tolerating the Wound VAC without complication. 08/23/18 on evaluation today patient actually appears to be doing better in regard to his wound. He has been tolerating the Wound VAC without complication and in fact it has been collecting a significant amount of drainage which I think is good news especially considering how the wound appears. Fortunately there is no signs of infection at this time definitely nothing appears to be worse which is good news. He has not been started on the Bactrim and Flagyl that was recommended by Dr. Delaine Lame yet. I did actually contact  her office this morning in order to check and see were things are that regard their gonna be calling me back. 08/30/18 on evaluation today patient actually appears to show signs of excellent improvement today compared to last evaluation. The undermining is getting much better the wound seems to be feeling quite nicely and I'm very pleased that the granulation in general. With that being said overall  I feel like the patient has made excellent progress which is great news. No fevers, chills, nausea, or vomiting noted at this time. 09/06/18 on evaluation today patient actually appears to be doing rather well in regard to his right gluteal ulcer. This is showing signs of improvement in overall I'm very pleased with how things seem to be progressing. The patient likewise is please. Overall I see no evidence of infection he is about to complete his oral antibiotic regimen which is the end of the antibiotics for him in just about three days. 09/13/18 on evaluation today patient's right Ischial ulcer appears to be showing signs of continued improvement which is excellent news. He's been tolerating the dressing changes without complication. Fortunately there's no signs of infection and the wound that seems to be doing very well. 09/28/18 on evaluation today patient appears to be doing rather well in regard to his right Ischial ulcer. He's been tolerating the Wound VAC without complication he knows there's much less drainage than there used to be this obviously is not a bad thing in my pinion. There's no evidence of infection despite the fact is but nothing about it now for several weeks. 10/04/18 on evaluation today patient appears to be doing better in regard to his right Ischial wound. He has been tolerating the Wound VAC without complication and I do believe that the silver nitrate last week was beneficial for him. Fortunately overall there's no evidence of active infection at this time which is great news. No  fevers, chills, nausea, or vomiting noted at this time. 10/11/18 on evaluation today patient actually appears to be doing rather well in regard to his Ischial ulcer. He's been tolerating the Wound VAC still without complication I feel like this is doing a good job. No fevers, chills, nausea, or vomiting noted at this time. 11/01/18 on evaluation today patient presents after having not been seen in our clinic for several weeks secondary to the fact that he was on evaluation today patient presents after having not been seen in our clinic for several weeks secondary to the fact that he was in a skilled nursing facility which was on lockdown currently due to the covert 19 national emergency. Subsequently he was discharged from the facility on this past Friday and subsequently made an appointment to come in to see yesterday. Fortunately there's no signs of active infection at this time which is good news and overall he does seem to have made progress since I last saw. Overall I feel like things are progressing quite nicely. The patient is having no pain. 11/08/18 on evaluation today patient appears to be doing okay in regard to his right gluteal ulcer. He has been utilizing a Wound VAC home health this changing this at this point since he's home from the skilled nursing facility. Fortunately there's no signs of obvious active infection at this time. Unfortunately though there's no obvious active infection he is having some maceration and his wife states that when the sheets of the Wound VAC office on Sunday when it broke seal that he ended up having significant issues with some smell as well there concerned about the possibility of infection. Fortunately there's No fevers, chills, nausea, or vomiting noted at this time. 11/15/18 on evaluation today patient actually appears to be doing well in regard to his right gluteal ulcer. He has been tolerating the dressing changes without complication. Specifically the  Wound VAC has been utilized up to this point. Fortunately there's no signs of  infection and overall I feel like he has made progress even since last week when I last saw him. I'm actually fairly happy with the overall appearance although he does seem to have somewhat of a hyper granular overgrowth in the central portion of the wound which I think may require some sharp debridement to try flatness out possibly utilizing chemical cauterization following. 11/23/18 on evaluation today patient actually appears to be doing very well in regard to his sacral ulcer. He seems to be showing signs of improvement with good granulation. With that being said he still has the small area of hyper granulation right in the central portion of the wound which I'm gonna likely utilize silver nitrate on today. Subsequently he also keeps having a leak at the 6 o'clock location which is unfortunate we may be able to help out with some suggestions to try to prevent this going forward. Fortunately there's no signs of active infection at this time. 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 He's been tolerating the dressing changes without complication. Fortunately there's no signs of active infection at this time. I've been rather pleased with how things have progressed there still some evidence of pressure getting to the area with some redness right around the immediate wound opening. Nonetheless other than this I'm not seeing any significant complications or issues the wound is somewhat hyper granular. Upon discussing with the patient and his wife today I'm not sure that the wound is being packed to the base with the foam at this point. And if it's not been packed fully that may be part of the reason why is not seen as much improvement as far as the granulation from the base out. Again we do not want pack too tightly but we need  some of the firm to get to the base of the wound. I discussed this with patient and his wife today. 12/06/18 on evaluation today patient appears to be doing well in regard to his right gluteal pressure ulcer. He's been tolerating the dressing changes without complication. Fortunately there's no signs of active infection. He still has some hyper granular tissue and I do think it would be appropriate to continue with the chemical cauterization as of today. 12/16/18 on evaluation today patient actually appears to be doing okay in regard to his right gluteal ulcer. He is been tolerating the dressing changes without complication including the Wound VAC. Overall I feel like nothing seems to be worsening I do feel like that the hyper granulation buds in the central portion of the wound have improved to some degree with the silver nitrate. We will have to see how things continue to progress. 12/20/18 on evaluation today patient actually appears to be doing much worse in my pinion even compared to last week's evaluation. Unfortunately as opposed to showing any signs of improvement the areas of hyper granular tissue in the central portion of the wound seem to be getting worse. Subsequently the wound bed itself also seems to be getting deeper even compared to last week which is both unusual as well as concerning since ALIN, MAISTO. ([id number]) prior he had been shown signs of improvement. Nonetheless I think that the issue could be that he's actually having some difficulty in issues with a deeper infection. There's no external signs of infection but nonetheless I am more worried about the internal, osteomyelitis, that could be restarting. He  has not been on antibiotics for some time at this point. I think that it may be a good idea to go ahead and started back on an antibiotic therapy while we wait to see what the testing shows. 12/27/18 on evaluation today patient presents for follow-up concerning his left  gluteal fold wound. Fortunately he appears to be doing well today. I did review the CT scan which was negative for any signs of osteomyelitis or acute abnormality this is excellent news. Overall I feel like the surface of the wound bed appears to be doing significantly better today compared to previously noted findings. There does not appear any signs of infection nor does he have any pain at this time. 01/03/19 on evaluation today patient actually appears to be doing quite well in regard to his ulcer. Post debridement last week he really did not have too much bleeding which is good news. Fortunately today this seems to be doing some better but we still has some of the hyper granular tissue noted in the base of the wound which is gonna require sharp debridement today as well. Overall I'm pleased with how things seem to be progressing since we switched away from the Wound VAC I think he is making some progress. 01/10/19 on evaluation today patient appears to be doing better in regard to his right gluteal fold ulcer. He has been tolerating the dressing changes without complication. The debridement to seem to be helping with current away some of the poor hyper granular tissue bugs throughout the region of his gluteal fold wound. He's been tolerating the dressing changes otherwise without complication which is great news. No fevers, chills, nausea, or vomiting noted at this time. 01/17/19 on evaluation today patient actually appears to be doing excellent in regard to his wound. He's been tolerating the dressing changes without complication. Fortunately there is no signs of active infection at this time which is great news. No fevers, chills, nausea, or vomiting noted at this time. 01/24/19 on evaluation today patient actually appears to be doing quite well with regard to his ulcer. He has been tolerating the dressing changes without complication. Fortunately there's no signs of active infection at this time.  Overall been very pleased with the progress that he seems to be making currently. 01/31/19-Patient returns at 1 week with apparent similarity in dimensions to the wound, with no signs of infection, he has been changing dressings twice a day 02/08/19 upon evaluation today patient actually appears to be doing well with regard to his right Ischial ulcer. The wound is not appear to be quite as deep and seems to be making progress which is good news. With that being said I'm still reluctant to go back to the Wound VAC at this point. He's been having to change the dressings twice a day which is a little bit much in my pinion from the wound care supplies standpoint. I think that possibly attempting to utilize extras orbit may be beneficial this may also help to prevent any additional breakdown secondary to fluid retention in the wound itself. The patient is in agreement with giving this a try. 02/15/19 on evaluation today patient actually appears to be doing decently well with regard to his ulcer in the right to gluteal fold location. He's been tolerating the dressing changes without complication. Fortunately there is no signs of active infection at this time. He is able to keep the current dressing in place more effectively for a day at a time whereas before he  was having a changes to to three times a day. The actions or has been helpful in this regard. Fortunately there's no signs of anything getting worse and I do feel like he showing signs of good improvement with regard to the wound bed status. 02/22/2019 patient appears to be doing very well today with regard to his ulcer in the gluteal fold. Fortunately there is no signs of active infection and he has been tolerating the dressing changes without any complication. Overall extremely pleased with how things seem to be progressing. He has much less of the hyper granular projections within the wound these have slowly been debrided away and he seems to be doing  well. The wound bed is more uniform. 03/01/19 on evaluation today patient appears to be doing unfortunately about the same in regard to his gluteal ulcer. He's been tolerating the dressing changes without complication. Fortunately there's no signs of active infection at this time. With that being said he continues to develop these hyper granular projections which I'm unsure of exactly what they are and why they are rising. Nonetheless I explained to the patient that I do believe it would be a good idea for [place] to stand a biopsy sample for pathology to see if that can shed any light on what exactly may be going on here. Fortunately I do not see any obvious signs of infection. With that being said the patient has had a little bit more drainage this week apparently compared to last week. 03/08/2019 on evaluation today patient actually appears to look somewhat better with regard to the appearance of his wound bed at this time. This is good news. Overall I am very pleased with how things seem to have progressed just in the past week with a switch to the [facility] dressing. I think that has been beneficial for him. With that being said at this time the patient is concerned about his biopsy that I sent off last week unfortunately I do not have that report as of yet. Nonetheless we have called to obtain this and hopefully will hear back from the lab later this morning. 03/15/19 on evaluation today patient's wound actually appears to be doing okay today with regard to the overall appearance of the wound bed. He has been tolerating the dressing changes without complication he still has hyper granular tissue noted but fortunately that seems to be minimal at this point compared to some of what we've seen in the past. Nonetheless I do think that he is still having some issues currently with some of this type of granulation the biopsy and since all showed nothing more than just evidence of granulation tissue.  Therefore there really is nothing different to initiate or do at this point. 03/24/19 on evaluation today patient appears to be doing a little better with regard to his ulcer. He's been tolerating the dressing changes without complication. Fortunately there is no signs of active infection at this time. No fevers, chills, nausea, or vomiting noted at this time. I'm overall pleased with how things seem to be progressing. 03/29/2019 on evaluation today patient appears to be doing about the same in regard to his ulcer in the right gluteal fold. Unfortunately he is not seeming to make a lot of progress and the wound is somewhat stalled. There is no signs of active infection externally but I am concerned about the possibility of infection continuing in the ischial location which previously he did have infection noted. Again is not able to  have an MRI so probably our best option for testing for this would be a triple phase bone scan which will detect subtle changes in the bone more so than plain film x-rays. In the past they really have not been beneficial and in fact the CT scans even have been somewhat questionable at times. Nonetheless there is no signs of systemic infection Peto, [place] J. ([id number]) which is at least good news but again his wound is not healing at all on the predicted schedule. 04/05/19 on evaluation today patient appears to be doing well all things considering with regard to his wound and the right gluteal fold. He actually has his triple bone scan scheduled for sometime in the next couple of days. With that being said I've also been looking to other possibilities of what could be causing hyper granular tissue were looking into the bone scan again for evaluating for the risk or possibility of infection deeper and I'm also gonna go ahead and see about obtaining a deep tissue culture today to send and see if there's any evidence of infection noted on culture. He's in agreement with  that plan. 04/12/2019 on evaluation today patient presents for reevaluation here in our clinic concerning ongoing issues with his right gluteal fold ulcer. 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 It was discussed with him at that point that I think it would be appropriate for him to proceed with hyperbaric oxygen therapy especially in light of the fact that he is previously been on IV antibiotics at the beginning of the year for close to 3 months followed by several weeks of oral antibiotics that was all prescribed by infectious disease. He had surgical debridement around Christmas December 2019 due to an abscess and osteomyelitis of the ischial bone. Unfortunately this has not really proceeded as well as we would have liked and again we did a CT scan even a couple months ago as he cannot have an MRI secondary to having issues with both a pain pump as well as a spinal cord stimulator which prevent him from going into an MRI machine. With that being said there were chronic changes noted at the ischial tuberosity which had progressed since December 2019 there was no evidence of fluid collection on that initial CT scan. With that being said that was on January 21, 2019. I am not sure that it was a sensitive enough test as compared to an MRI and then subsequently I ordered a bone scan for him which was actually completed on 03/29/2019 and this revealed that he does indeed have positive osteomyelitis involving the right ischial tuberosity. This is adjacent to the ulcer and I think is the reason that his ulcer is not healing. Subsequently I am in a place him back on oral antibiotics today unfortunately his wound culture showed just mixed gram-negative flora with no specific findings of a predominant organism. Nonetheless being with the location I think a good broad-spectrum antibiotic for  gram-negative's is a good choice at this point he is previously taken Levaquin without significant issues and I think that is an appropriate antibiotic for him at this time. 04/19/2019 on evaluation today patient actually appears to be doing fairly well with regard to his wound. He has been tolerating the dressing changes without complication. Fortunately there is no signs of active infection and he has been taking the oral antibiotics at  this time. Subsequently we did make a referral to infectious disease although Dr. Steva Ready wants the patient to be seen by general surgery first in order apparently to see if there is anything from a surgical standpoint that should be done prior to initiation of IV antibiotic therapy. Again the patient is okay with actually seeing Dr. Celine Ahr whom he has seen before we will make that referral. 04/26/2019 on evaluation today patient actually appears to be doing well with regard to his wound. He has been tolerating the dressing changes without complication. Fortunately there is no signs of active infection at this time. I do believe that the hyperbaric oxygen therapy along with the antibiotics which I prescribed at this point have been doing well for him. With that being said he has not seen Dr. Celine Ahr the surgeon yet in fact they have not been contacted for scheduling an appointment as of yet. Subsequently the patient is not on antibiotics currently by IV Dr. Steva Ready did want him to see Dr. Celine Ahr first which we are working on trying to get scheduled. We again give the information to the patient today for Dr. Celine Ahr and her number as far as contacting their office to see about getting something scheduled. Again were looking for whether or not they recommend any surgical intervention. 05/03/2019 on evaluation today patient appears to be doing about the same with regard to his wound there may be a little bit of filling in in regard to the base of the wound but still  he has quite a bit of hyper granular tissue that I am seeing today. I believe that he may may need a more aggressive sharp debridement possibly even by the surgeon or under anesthesia in order to clear away some of the hyper granular material in order to help allow for appropriate granulation to fill in. We have made a referral to Dr. Celine Ahr unfortunately it sounds as if they did not receive the referral we contacted them today they should be get in touch with the patient. Depending on how things show from that standpoint the patient may need to see Dr. Ardyth Gal who is the infectious disease specialist although he really does not want a PICC line again. No fevers, chills, nausea, vomiting, or diarrhea. He is tolerating hyperbaric oxygen therapy very well. 05/12/2019 on evaluation today patient presents for follow-up visit concerning his right gluteal fold ulcer. He did see Dr. Celine Ahr the surgeon who previously evaluated his wound and she actually felt like he was doing quite well with regard to the wound based on what she was seen. He does seem to be responding to some degree with the oral antibiotics along with hyperbaric oxygen therapy at this point she did not see any evidence or need for further surgical intervention at this point. She recommended deferring additional or ongoing antibiotic therapy to Dr. Steva Ready at her discretion. Fortunately there is no signs of active systemic infection. No fevers, chills, nausea, vomiting, or diarrhea. 10/29; patient I am seeing really for the first time today in terms of his wound. 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 He is being treated with hyperbaric oxygen for underlying osteomyelitis most recently documented by a three-phase bone scan. He has been on Levaquin for roughly a month and he is out of these and asked me for a refill. Most recent cultures were negative although I do not think these were bone cultures. He has a  very irregular surface to this wound  which is almost nodular in texture. It undermines superiorly with the same nodular hyper granulated surface. I see that he was referred to general surgery for an operative debridement although the surgeon did not agree with this I think that would have been the proper course of action in this. He has been using Hydrofera Blue. He is supposed to start a wound VAC tomorrow which is actually a reapplication of wound VAC. I think mostly last time they had trouble keeping the seal in place I hope we have better look at this this time. 05/24/2019 on evaluation today I did review patient's note from Dr. Dellia Nims where he was seen last week when I was out of the office. Subsequently it does appear that Dr. Dellia Nims was in agreement that the patient really needed debridement to clear away some of the nodular tissue in the base of the wound. The good news is he has the wound VAC initiated and some of this tissue is already starting to breakdown under the treatment of the wound VAC again because it is not really viable. Nonetheless I do believe that we can likely perform some debridement today to clear this away and hopefully the continuation of the wound VAC along with hyperbarics and antibiotic therapy will be beneficial in stopping this from reoccurring. If we get better control in the wound bed in a good spot I think we can definitely use the PriMatrix which we actually did get approval for. 05/31/2019 on evaluation today patient appears to be doing actually pretty well at this point with regard to his wound. He has been tolerating the dressing changes which is good news. Fortunately there is no signs of infection. He still has some of the hyper granular nodules noted we have not really cleared all these away and I think we can try to do a little bit more that today. I did do quite a bit last week I am hoping to get down to the base of the wound so though actually be able to  heal more appropriately. I think the wound VAC is doing a good job right now with controlling the drainage and overall the patient is happy with that. 06/07/2019 upon evaluation today patient appears to be doing quite well compared to last week with regard to the hyper granulation in the base of the [place], Ale[place] J. ([id number]) wound. He has been tolerating the dressing changes without complication. Fortunately there is no signs of active infection at this time. With that being said we have not had any recent measures as far as blood work is concerned I think we may want to check a few things including a CBC, sed rate, and C-reactive protein. Patient is in agreement with that we can try to get that scheduled for him for this Friday. 06/13/2019 on evaluation today patient actually appears to be doing much better at this point based on what I am seeing with regard to his wound. He has much less in the way of hyper granular tissue which is good news and a lot more healing that is taken place since I last saw him as far as the amount of space within the wound itself. With that being said he is nearing the completion of the initial 40 treatments for hyperbaric oxygen therapy I think this is something I would recommend extending as he does seem to be benefiting at this time. Fortunately there is no evidence of active systemic infection at this point nor even local infection for  that matter. 06/20/2019 upon evaluation today patient actually appears to be doing well with regard to his wound. In fact this appears to be doing much better today compared to what it was previous. I been extremely pleased with the progress that he is made. In fact the tunnel is much less deep today than what it was in the past and I am seeing excellent signs of improvement. Overall I am happy with how things are going. I did review his white blood cell count which revealed everything to be okay. The one abnormal finding on his  CBC was a hemoglobin of 12.7. Subsequently I did also review his sed rate and C-reactive protein. His sed rate measured in at normal level measuring at 26. The C-reactive protein however was elevated today and an abnormal range indicating inflammation. Still I feel like he is trending in a good direction at this time. He is going to need a refill of the Levaquin he tells me at this point. 07/04/2019 on evaluation today patient actually appears to be doing well in regard to his wound. He has been tolerating the dressing changes without complication. Fortunately there is no signs of active infection which is good news. No fevers, chills, nausea, vomiting, or diarrhea. The good news also is that I did review his labs and though his sed rate was slightly elevated the C-reactive protein was actually down compared to the last evaluation 2 weeks ago this is excellent news I think it is showing signs of improvement. 07/11/2019 on evaluation today patient actually appears to be doing quite well with regard to his wound on my evaluation. This is very slow going but nonetheless he seems to be making progress. Especially in regard to the depth. Nonetheless I believe that we may want to consider going forward with the PriMatrix which was previously approved for him. We will get a have to get a new approval as we will actually be applying this in the new year so we will likely obtain that approval on January 4. In the meantime and for the time being my suggestion is going to be that we go ahead and continue with the wound VAC and we were going at this point with the current wound care measures. 12/28-Patient seen after HBO session today, wound depth with the tunnel measures 3.2 cm, the wound bed appears healthy, continuing with the VAC and HBO 07/25/2019 on evaluation today patient's wound appears to be doing about the same compared to my last evaluation. Fortunately there is no evidence of significant infection  which is good news we will still waiting on the results of his lab work which unfortunately was not done prior to today's visit which is what we were aiming for. Ultimately there is no sign of active infection at this time which is good news. And he also tells me that the wound VAC has been staying in place and doing fairly well which is also good news. With that being said I am concerned about the fact that the wound has not progressed as much I think we may want to look into getting reapproval for the skin substitute, specifically PriMatrix, that I am hopeful will help to allow this area to granulate in more effectively. I would recommend as well continuing with the wound VAC along with this. 08/01/2019 on evaluation today patient appears to be doing in my opinion slightly better in regard to the wound. He actually does have some tightening of the walls around the edges  which is a good thing. Fortunately there is no signs of active infection at this time. No fever chills noted. I do believe that the patient in general seems to be making progress although is very slow. Obviously were trying to get things to speed up somewhat more. No fevers, chills, nausea, vomiting, or diarrhea. 08/08/2019 upon evaluation today patient appears to be doing well with regard to his wound although there is not a significant improvement overall we do have the PriMatrix for availability today to apply. Fortunately there is no signs of active infection and the patient overall seems to be doing well. There is really no need for aggressive sharp debridement at this point either which is also good news. I do believe her get a need to suture and the primary checks in the basin in the PriMatrix in order to keep it in place at the base of the wound underneath the wound VAC. This was discussed with the patient today as well. He is in agreement the plan. He really does not have any filling in the area and so this should not be a  complication as far as suturing is concerned. 08/15/2019 on evaluation today patient appears to be doing somewhat better in my opinion in regard to the overall appearance of his wound. He has been tolerating the dressing changes without complication. Fortunately the skin subappear to still be in place when this was changed on Friday. With that being said it appears that the PriMatrix may have dissolved in the interim between now and then as the sutures are no longer holding anything in place and again I would sutured it into place. Nonetheless I believe that we can go ahead and likely consider going forward that we may need to apply the PriMatrix weekly as opposed every other week as it does seem to have dissolved rather readily and well. Overall the wound bed appears to be doing excellent at this time. 08/22/2019 upon evaluation today the patient does seem to making some progress with regard to his wound. Overall there is some better quality tissue and less of the hyper granular protrusions all of which is good news. With that being said we do have the PriMatrix available for application today and I am in a try to find a better way to suture this in place for him today. 08/29/2019 on evaluation today patient appears to be doing very well in regard to his wound. I do see signs of new granulation which seems to be somewhat helpful he did have some need for sharp debridement today to clear some of the hyper granulation away but nonetheless I feel like we are headed in a good direction. Fortunately there is no evidence of any systemic infection although we are going to go ahead and reorder the CBC, C-reactive protein, and sed rate today to see where things stand I did just refill his Levaquin as well. 09/05/2019 upon evaluation today patient's wound actually is not appearing to be showing signs of significant improvement compared to what we have seen previous from the standpoint of depth. He overall wound  size wise seems to be about the same and again some of the quality of the wound bed is still doing fairly well. I did review his labs and his CBC was normal with no signs of elevation white blood cell count, his C-reactive protein was normal, and the sed rate was slightly elevated but again I think this may just be due to ulceration really there is  nothing significant to indicate severe infection. I think when he likely finishes his current round of oral antibiotics with Levaquin will likely take him off of the medication at that point based on the Marshall, JACKOB VANANTWERP. ([id number]) findings. I do feel like the hyperbarics helped in getting the infection under control as well as improving the overall size of the wound quite significantly. With that being said at this point I really feel like more more at the time frame that he may benefit from a muscle flap in order to try to help this wound heal effectively. I think that this would be the fastest way in order to do this. In the past he has been somewhat resistant to going down that path but nonetheless I feel like he likely should. 09/12/2019 on evaluation today patient appears to be doing fairly well in regard to his wound. He is tolerating the wound VAC without complication is still continues to drain significantly. Fortunately there is no signs of active infection at this time which is great news his lab work is also been doing very well. With that being said I do think at this point that the patient is still deriving benefit from the wound VAC especially in regard to controlling moisture which I think it is doing fairly well. He also has some improvement in the overall wound bed and on the medial aspect of the wound there are some hyper granular projections but nothing as severe as what they have been in the past. I am good have to perform some sharp debridement at this location today. Objective Constitutional Well-nourished and well-hydrated in  no acute distress. Vitals Time Taken: 8:10 AM, Height: 73 in, Weight: 210 lbs, BMI: 27.7, Temperature: 98.5 F, Pulse: 83 bpm, Respiratory Rate: 16 breaths/min, Blood Pressure: 139/83 mmHg. Respiratory normal breathing without difficulty. Psychiatric this patient is able to make decisions and demonstrates good insight into disease process. Alert and Oriented x 3. pleasant and cooperative. General Notes: Patient's wound bed currently did require sharp debridement to remove hyper granular projections from the surface of the wound which he tolerated well without complication and post debridement the wound bed appears to be doing significantly better which is great news. Overall I do feel like we are seeing improvement although this is very slow were trying to look into getting the patient in with plastic surgery for the possibility of a skin flap. They required an albumin and prealbumin test prior to seeing the patient we have placed the order for that that should be completed today. Integumentary (Hair, Skin) Wound #1 status is Open. Original cause of wound was Pressure Injury. The wound is located on the Right Gluteal fold. The wound measures 3.2cm length x 1.6cm width x 2.4cm depth; 4.021cm^2 area and 9.651cm^3 volume. There is Fat Layer (Subcutaneous Tissue) Exposed exposed. There is no undermining noted, however, there is tunneling at 12:00 with a maximum distance of 4cm. There is a large amount of serous drainage noted. The wound margin is epibole. There is large (67-100%) pink, hyper - granulation within the wound bed. There is a small (1-33%) amount of necrotic tissue within the wound bed including Adherent Slough. 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 ([id number]) Wound #1 Pre-procedure diagnosis of Wound #1 is a Pressure Ulcer located on the Right Gluteal  fold . There was a Excisional Skin/Subcutaneous Tissue Debridement with a total area of 1.6 sq cm performed by [name], Ladarien Beeks E., [name]. With the following instrument(s): Curette to remove Viable and Non-Viable tissue/material. Material removed includes Subcutaneous Tissue and Slough and after achieving pain control using Lidocaine. No specimens were taken. A time out was conducted at 08:27, prior to the start of the procedure. A Minimum amount of bleeding was controlled with Pressure. The procedure was tolerated well. Post Debridement Measurements: 3.2cm length x 1.6cm width x 2.4cm depth; 9.651cm^3 volume. Post debridement Stage noted as Category/Stage IV. Character of Wound/Ulcer Post Debridement is stable. Post procedure Diagnosis Wound #1: Same as Pre-Procedure Plan Wound Cleansing: Wound #1 Right Gluteal fold: Clean wound with Normal Saline. - in office Cleanse wound with mild soap and water Anesthetic (add to Medication List): Wound #1 Right Gluteal fold: Topical Lidocaine 4% cream applied to wound bed prior to debridement (In Clinic Only). Skin Barriers/Peri-Wound Care: Skin Prep Primary Wound Dressing: Wound #1 Right Gluteal fold: Saline moistened gauze - in clinic only Secondary Dressing: Wound #1 Right Gluteal fold: Boardered Foam Dressing - In Clinic Dressing Change Frequency: Wound #1 Right Gluteal fold: Change Dressing Monday, Wednesday, Friday Follow-up Appointments: Wound #1 Right Gluteal fold: Return Appointment in 1 week. Off-Loading: Wound #1 Right Gluteal fold: Turn and reposition every 2 hours - no pressure on wounded area Home Health: Wound #1 Right Gluteal fold: Continue Home Health Visits - [facility]: Home Health to Draw labs: Albumin and Pre-albumin and fax to [phone]. Home Health Nurse may visit PRN  to address patient s wound care needs. FACE TO FACE ENCOUNTER: MEDICARE and MEDICAID PATIENTS: I certify that this patient is under my care and that I had a face-to-face encounter that meets the physician face-to-face encounter requirements with this patient on this date. The encounter with the patient was in whole or in part for the following MEDICAL CONDITION: (primary reason for [place]) MEDICAL NECESSITY: I certify, that based on my findings, NURSING services are a medically necessary home health service. HOME BOUND STATUS: I certify that my clinical findings support that this patient is homebound (i.e., Due to illness or injury, pt requires aid of supportive devices such as crutches, cane, wheelchairs, walkers, the use of special transportation or the assistance of another person to leave their place of residence. There is a normal inability to leave the home and doing so requires considerable and taxing effort. Other absences are for medical reasons / religious services and are infrequent or of short duration when for other reasons). If current dressing causes regression in wound condition, may D/C ordered dressing product/s and apply Normal Saline Moist Dressing daily until next [place] / Other MD appointment. [place] of regression in wound condition at [phone]. Please direct any NON-WOUND related issues/requests for orders to patient's Primary Care Physician Negative Pressure Wound Therapy: Wound #1 Right Gluteal fold: Wound VAC settings at 125/130 mmHg continuous pressure. Use BLACK/GREEN foam to wound cavity. Use WHITE foam to fill any tunnel/s and/or undermining. Change VAC dressing 3 X WEEK. Change canister as indicated when full. Nurse may titrate settings and frequency of dressing changes as clinically indicated. - Apply wound vac as normal Home Health Nurse may d/c VAC for s/s of increased infection, significant wound regression, or  uncontrolled drainage. [place] at [phone]. Apply contact layer over base of wound. - contact layer over sutured Primatrix in tunnel at 12:00. Number of foam/gauze  pieces used in the dressing = Medications-please add to medication list.: Wound #1 Right Gluteal fold: P.O. Antibiotics - Complete antibiotics. JACODY, CAPITO ([id number]) 1. My suggestion at this time is can be that we continue with a wound VAC I do believe this is beneficial for the patient both in recurrent controlling the drainage as well as helping to improve the granulation tissue which it definitely has done. There is also some decrease in the depth today which is also good news. 2. I am also going to recommend that he continue with appropriate offloading again he tells me that he spends about 22 hours of his day in bed at this point. 3. I am going to go ahead and continue with pursuing the possibility of getting the patient in with the plastic surgeon at [facility] we just have to confirm his albumin and prealbumin levels prior and I will go ahead and have that done later today we have already sent the order to home health and since we get the results we will send everything through to [facility] for their review. We will see patient back for reevaluation in 1 week here in the clinic. If anything worsens or changes patient will contact our office for additional recommendations. Electronic Signature(s) Signed: 09/12/2019 8:57:14 AM By: Worthy Keeler [name] Entered By: Worthy Keeler on 09/12/2019 08:57:14 DELRON, TAIWO ([id number]) -------------------------------------------------------------------------------- SuperBill Details Patient Name: [name] Date of Service: 09/12/2019 Medical Record Number: [id number] Patient Account Number: [id number] Date of Birth/Sex: [birth year] (69 y.o. 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, sequela I10 Essential (primary) hypertension Facility Procedures CPT4 Code: [id number] Description: [id number] - DEB SUBQ TISSUE 20 SQ CM/< Modifier: Quantity: 1 CPT4 Code: Description: ICD-10 Diagnosis Description L89.314 Pressure ulcer of right buttock, stage 4 Modifier: Quantity: Physician Procedures CPT4 Code: [id number] Description: 11042 - WC PHYS SUBQ TISS 20 SQ CM Modifier: Quantity: 1 CPT4 Code: Description: ICD-10 Diagnosis Description L89.314 Pressure ulcer of right buttock, stage 4 Modifier: Quantity: Electronic Signature(s) Signed: 09/12/2019 8:58:20 AM By: Worthy Keeler [name] Entered By: Worthy Keeler on 09/12/2019 08:58:20

## 2019-09-23 NOTE — Progress Notes (Signed)
CAROLS, KINTIGH ([id number]) Visit Report for 09/12/2019 Arrival Information Details Patient Name: [name], [name] Date of Service: 09/12/2019 8:00 AM Medical Record Number: [id number] Patient Account Number: [id number] Date of Birth/Sex: 1951-03-12 (69 y.o. M) Treating RN: Montey Hora Primary Care Fredis Malkiewicz: [name] Other Clinician: Referring Adenike Shidler: [name] Treating Shiana Rappleye/Extender: [name], HOYT Weeks in Treatment: 40 Visit Information History Since Last Visit Added or deleted any medications: No Patient Arrived: Wheel Chair Any new allergies or adverse reactions: No Arrival Time: 08:08 Had a fall or experienced change in No Accompanied By: wife activities of daily living that may affect Transfer Assistance: None risk of falls: Patient Identification Verified: Yes Signs or symptoms of abuse/neglect since last visito No Secondary Verification Process Completed: Yes Hospitalized since last visit: No Patient Requires Transmission-Based Precautions: No Implantable device outside of the clinic excluding No Patient Has Alerts: Yes cellular tissue based products placed in the center Patient Alerts: NOT Diabetic since last visit: Has Dressing in Place as Prescribed: Yes Pain Present Now: No Electronic Signature(s) Signed: 09/12/2019 4:53:18 PM By: Montey Hora Entered By: Montey Hora on 09/12/2019 08:10:19 [name] ([id number]) -------------------------------------------------------------------------------- Encounter Discharge Information Details Patient Name: [name] Date of Service: 09/12/2019 8:00 AM Medical Record Number: [id number] Patient Account Number: [id number] Date of Birth/Sex: [birth year] (69 y.o. M) Treating RN: [name] Primary Care Aryiah Monterosso: [name] Other Clinician: Referring Daeshaun Specht: [name] Treating Murray Durrell/Extender: [name], HOYT Weeks in Treatment: 64 Encounter Discharge  Information Items Post Procedure Vitals Discharge Condition: Stable Temperature (F): 98.5 Ambulatory Status: Ambulatory Pulse (bpm): 83 Discharge Destination: Home Respiratory Rate (breaths/min): 16 Transportation: Private Auto Blood Pressure (mmHg): 139/83 Accompanied By: self Schedule Follow-up Appointment: Yes Clinical Summary of Care: Electronic Signature(s) Signed: 09/23/2019 5:40:44 PM By: Gretta Cool, BSN, RN, CWS, Kim RN, BSN Entered By: Gretta Cool, BSN, RN, CWS, Kim on 09/12/2019 08:34:52 ELDO, WOHLER ([id number]) -------------------------------------------------------------------------------- Lower Extremity Assessment Details Patient Name: [name] Date of Service: 09/12/2019 8:00 AM Medical Record Number: [id number] Patient Account Number: [id number] Date of Birth/Sex: February 12, 1951 (69 y.o. M) Treating RN: Montey Hora Primary Care Latara Micheli: [name] Other Clinician: Referring Duaine Radin: [name] Treating Gretel Cantu/Extender: [name], HOYT Weeks in Treatment: 42 Electronic Signature(s) Signed: 09/12/2019 4:53:18 PM By: Montey Hora Entered By: Montey Hora on 09/12/2019 08:10:40 MICHAELANTHONY, RAYSOR ([id number]) -------------------------------------------------------------------------------- Multi Wound Chart Details Patient Name: [name] Date of Service: 09/12/2019 8:00 AM Medical Record Number: [id number] Patient Account Number: [id number] Date of Birth/Sex: 06-18-1951 (69 y.o. M) Treating RN: [name] Primary Care Huel Centola: [name] Other Clinician: Referring Danel Studzinski: [name] Treating Earleen Aoun/Extender: [name], HOYT Weeks in Treatment: 93 Vital Signs Height(in): 73 Pulse(bpm): 75 Weight(lbs): 210 Blood Pressure(mmHg): 139/83 Body Mass Index(BMI): 28 Temperature(F): 98.5 Respiratory Rate(breaths/min): 16 Photos: [N/A:N/A] Wound Location: Right Gluteal fold N/A N/A Wounding Event: Pressure  Injury N/A N/A Primary Etiology: Pressure Ulcer N/A N/A Comorbid History: Cataracts, Hypertension, Paraplegia N/A N/A Date Acquired: 11/02/2017 N/A N/A Weeks of Treatment: 93 N/A N/A Wound Status: Open N/A N/A Measurements L x W x D (cm) 3.2x1.6x2.4 N/A N/A Area (cm) : 4.021 N/A N/A Volume (cm) : 9.651 N/A N/A % Reduction in Area: 60.00% N/A N/A % Reduction in Volume: -860.30% N/A N/A Position 1 (o'clock): 12 Maximum Distance 1 (cm): 4 Tunneling: Yes N/A N/A Classification: Category/Stage IV N/A N/A Exudate Amount: Large N/A N/A Exudate Type: Serous N/A N/A Exudate Color: amber N/A N/A Wound Margin: Epibole N/A N/A Granulation Amount: Large (67-100%) N/A N/A  Granulation Quality: Pink, Hyper-granulation N/A N/A Necrotic Amount: Small (1-33%) N/A N/A Exposed Structures: Fat Layer (Subcutaneous Tissue) N/A N/A Exposed: Yes Fascia: No Tendon: No Muscle: No Joint: No Bone: No Epithelialization: Small (1-33%) N/A N/A Treatment Notes Electronic Signature(s) Signed: 09/23/2019 5:40:44 PM By: Gretta Cool, BSN, RN, CWS, Kim RN, BSN [address], [place] ([id number]) Entered By: Gretta Cool, BSN, RN, CWS, Kim on 09/12/2019 08:28:57 STRATTON, JEANMARIE ([id number]) -------------------------------------------------------------------------------- [place] Details Patient Name: [name] Date of Service: 09/12/2019 8:00 AM Medical Record Number: [id number] Patient Account Number: [id number] Date of Birth/Sex: [birth year] (69 y.o. M) Treating RN: [name] Primary Care Del Wiseman: [name] Other Clinician: Referring Loretta Kluender: [name] Treating Yvonnia Tango/Extender: [name], HOYT Weeks in Treatment: 3 Active Inactive Orientation to the Wound Care Program Nursing Diagnoses: Knowledge deficit related to the wound healing center program Goals: Patient/caregiver will verbalize understanding of the [facility] Program Date Initiated:  11/30/2017 Target Resolution Date: 12/21/2017 Goal Status: Active Interventions: Provide education on orientation to the wound center Notes: Osteomyelitis Nursing Diagnoses: Infection: osteomyelitis Goals: Patient's osteomyelitis will resolve Date Initiated: 08/29/2019 Target Resolution Date: 09/28/2019 Goal Status: Active Signs and symptoms for osteomyelitis will be recognized and promptly addressed Date Initiated: 08/29/2019 Target Resolution Date: 09/28/2019 Goal Status: Active Interventions: Assess for signs and symptoms of osteomyelitis resolution every visit Treatment Activities: Systemic antibiotics : 08/29/2019 Notes: Pressure Nursing Diagnoses: Knowledge deficit related to management of pressures ulcers Goals: Patient/caregiver will verbalize understanding of pressure ulcer management Date Initiated: 05/17/2018 Target Resolution Date: 05/28/2018 Goal Status: Active Interventions: Assess: immobility, friction, shearing, incontinence upon admission and as needed Notes: SEIICHI, DEBUSK ([id number]) Wound/Skin Impairment Nursing Diagnoses: Impaired tissue integrity Goals: Patient/caregiver will verbalize understanding of skin care regimen Date Initiated: 11/30/2017 Target Resolution Date: 12/21/2017 Goal Status: Active Ulcer/skin breakdown will have a volume reduction of 30% by week 4 Date Initiated: 11/30/2017 Target Resolution Date: 12/21/2017 Goal Status: Active Interventions: Assess patient/caregiver ability to obtain necessary supplies Assess patient/caregiver ability to perform ulcer/skin care regimen upon admission and as needed Assess ulceration(s) every visit Treatment Activities: Skin care regimen initiated : 11/30/2017 Notes: Electronic Signature(s) Signed: 09/23/2019 5:40:44 PM By: Gretta Cool, BSN, RN, CWS, Kim RN, BSN Entered By: Gretta Cool, BSN, RN, CWS, Kim on 09/12/2019 08:28:48 THOR, BRENN  ([id number]) -------------------------------------------------------------------------------- Pain Assessment Details Patient Name: [name] Date of Service: 09/12/2019 8:00 AM Medical Record Number: [id number] Patient Account Number: [id number] Date of Birth/Sex: 1951/07/14 (69 y.o. M) Treating RN: Montey Hora Primary Care Emberlie Gotcher: [name] Other Clinician: Referring Tylie Golonka: [name] Treating Janequa Kipnis/Extender: [name], HOYT Weeks in Treatment: 59 Active Problems Location of Pain Severity and Description of Pain Patient Has Paino No Site Locations Pain Management and Medication Current Pain Management: Electronic Signature(s) Signed: 09/12/2019 4:53:18 PM By: Montey Hora Entered By: Montey Hora on 09/12/2019 08:10:30 [name] ([id number]) -------------------------------------------------------------------------------- Patient/Caregiver Education Details Patient Name: [name] Date of Service: 09/12/2019 8:00 AM Medical Record Number: [id number] Patient Account Number: [id number] Date of Birth/Gender: 1951-05-25 (69 y.o. M) Treating RN: [name] Primary Care Physician: [name] Other Clinician: Referring Physician: [name] Treating Physician/Extender: Sharalyn Ink in Treatment: 66 Education Assessment Education Provided To: Patient Education Topics Provided Wound Debridement: Handouts: Wound Debridement Methods: Demonstration, Explain/Verbal Responses: State content correctly Wound/Skin Impairment: Handouts: Caring for Your Ulcer Methods: Demonstration, Explain/Verbal Responses: State content correctly Electronic Signature(s) Signed: 09/23/2019 5:40:44 PM By: Gretta Cool, BSN, RN, CWS, Kim RN, BSN Entered By: Gretta Cool, BSN, RN, CWS, Kim  on 09/12/2019 08:33:51 ARCH, PLANT ([id number]) -------------------------------------------------------------------------------- Wound Assessment  Details Patient Name: ZAYLON, GARAVITO Date of Service: 09/12/2019 8:00 AM Medical Record Number: [id number] Patient Account Number: [id number] Date of Birth/Sex: [birth year] (69 y.o. M) Treating RN: Montey Hora Primary Care Livan Hires: [name] Other Clinician: Referring Humza Tallerico: [name] Treating Arlie Posch/Extender: [name], HOYT Weeks in Treatment: 93 Wound Status Wound Number: 1 Primary Etiology: Pressure Ulcer Wound Location: Right Gluteal fold Wound Status: Open Wounding Event: Pressure Injury Comorbid History: Cataracts, Hypertension, Paraplegia Date Acquired: 11/02/2017 Weeks Of Treatment: 93 Clustered Wound: No Photos Wound Measurements Length: (cm) 3.2 Width: (cm) 1.6 Depth: (cm) 2.4 Area: (cm) 4.021 Volume: (cm) 9.651 % Reduction in Area: 60% % Reduction in Volume: -860.3% Epithelialization: Small (1-33%) Tunneling: Yes Position (o'clock): 12 Maximum Distance: (cm) 4 Undermining: No Wound Description Classification: Category/Stage IV Wound Margin: Epibole Exudate Amount: Large Exudate Type: Serous Exudate Color: amber Foul Odor After Cleansing: No Slough/Fibrino Yes Wound Bed Granulation Amount: Large (67-100%) Exposed Structure Granulation Quality: Pink, Hyper-granulation Fascia Exposed: No Necrotic Amount: Small (1-33%) Fat Layer (Subcutaneous Tissue) Exposed: Yes Necrotic Quality: Adherent Slough Tendon Exposed: No Muscle Exposed: No Joint Exposed: No Bone Exposed: No Electronic Signature(s) Signed: 09/12/2019 4:53:18 PM By: Montey Hora Entered By: Montey Hora on 09/12/2019 08:23:13 EDDEN, DEEM ([id number]) Tamera Punt, [name] -------------------------------------------------------------------------------- Vitals Details Patient Name: [name] Date of Service: 09/12/2019 8:00 AM Medical Record Number: [id number] Patient Account Number: [id number] Date of Birth/Sex: Nov 15, 1950 (68 y.o.  M) Treating RN: Montey Hora Primary Care Bartt Gonzaga: [name] Other Clinician: Referring Rayaan Garguilo: [name] Treating Daxtin Leiker/Extender: [name], HOYT Weeks in Treatment: 93 Vital Signs Time Taken: 08:10 Temperature (F): 98.5 Height (in): 73 Pulse (bpm): 83 Weight (lbs): 210 Respiratory Rate (breaths/min): 16 Body Mass Index (BMI): 27.7 Blood Pressure (mmHg): 139/83 Reference Range: 80 - 120 mg / dl Electronic Signature(s) Signed: 09/12/2019 4:53:18 PM By: Montey Hora Entered By: Montey Hora on 09/12/2019 [id number]

## 2019-09-26 ENCOUNTER — Other Ambulatory Visit: Payer: Self-pay

## 2019-09-26 ENCOUNTER — Encounter: Payer: Medicare Other | Admitting: Physician Assistant

## 2019-09-26 DIAGNOSIS — L89314 Pressure ulcer of right buttock, stage 4: Secondary | ICD-10-CM | POA: Diagnosis not present

## 2019-09-27 NOTE — Progress Notes (Signed)
CANDIDO, ZUKOSKI ([id number]) Visit Report for 09/26/2019 Chief Complaint Document Details Patient Name: [name], HAUPTMANN Date of Service: 09/26/2019 8:00 AM Medical Record Number: [id number] Patient Account Number: [id number] Date of Birth/Sex: [birth year] (68 y.o. M) Treating RN: Primary Care Provider: [name] Other Clinician: Referring Provider: [name] Treating Provider/Extender: [name], Dava Rensch Weeks in Treatment: 95 Information Obtained from: Patient Chief Complaint Right gluteal fold ulcer Electronic Signature(s) Signed: 09/27/2019 12:55:39 AM By: Worthy Keeler [name] Entered By: Worthy Keeler on 09/26/2019 08:15:31 ABBOTT, MICHAELIS ([id number]) -------------------------------------------------------------------------------- HPI Details Patient Name: [name] Fillers Date of Service: 09/26/2019 8:00 AM Medical Record Number: [id number] Patient Account Number: [id number] Date of Birth/Sex: 11/07/1950 (68 y.o. 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 He states that there is no history of diabetes that he is aware of. He does have issues with his prostate and is currently receiving treatment for this by way of oral medication. With that being said I do not have a lot of details in that regard. Nonetheless the patient presents today as a result of having been referred to [place] by another provider initially home health was set to come out and take care of his wound although due to the fact that he apparently drives he's not able  to receive home health. His wife is therefore trying to help take care of this wound within although they have been struggling with what exactly to do at this point. She states that she can do some things but she is definitely not a nurse and does have some issues with looking at blood. The good news is the wound does not appear to be too deep and is fairly superficial at this point. There is no slough noted there is some nonviable skin noted around the surface of the wound and the perimeter at this point. The central portion of the wound appears to be very good with a dermal layer noted this does not appear to be again deep enough to extend it to subcutaneous tissue at this point. 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 In the pump he has Dilaudid and baclofen. 12/07/17 on evaluation today patient presents for follow-up concerning his ongoing lower back thigh ulcer on the right. He states that he did not get the supplies ordered and therefore has not really been able to perform the dressing changes as directed exactly. His wife was able to get some Boarder Foam Dressing's from the drugstore and subsequently has been using hydrogel which did help to a degree in the wound does appear to be able smaller. There is actually more drainage this week noted than previous. 12/21/17 on evaluation today patient appears to be doing rather well in regard to his right gluteal ulcer. He has been tolerating the dressing changes without complication. There does not appear to be any evidence of infection at this point in time. Overall the wound does seem to be making some progress as far as the edges are concerned there's not  as much in the way of overlapping of the external wound edges and he has a good epithelium to wound bed border for the most part. This however is not true right at the 12 o'clock location over  the span of a little over a centimeters which actually will require debridement today to clean this away and hopefully allow it to continue to heal more appropriately. 12/28/17 on evaluation today patient appears to be doing rather well in regard to his ulcer in the left gluteal region. He's been tolerating the dressing changes without complication. Apparently he has had some difficulty getting his dressing material. Apparently there's been some confusion with ordering we're gonna check into this. Nonetheless overall he's been showing signs of improvement which is good news. Debridement is not required today. 01/04/18 on evaluation today patient presents for follow-up concerning his right gluteal ulcer. He has been tolerating the dressing changes fairly well. On inspection today it appears he may actually have some maceration them concerned about the fact that he may be developing too much moisture in and around the wound bed which can cause delay in healing. With that being said he unfortunately really has not showed significant signs of improvement since last week's evaluation in fact this may even be just the little bit/slightly larger. Nonetheless he's been having a lot of discomfort I'm not sure this is even related to the wound as he has no pain when I'm to breeding or otherwise cleaning the wound during evaluation today. Nonetheless this is something that we did recommend he talked to his pain specialist concerning. 01/11/18 on evaluation today patient appears to be doing better in regard to his ulceration. He has been tolerating the dressing changes without complication. With that being said overall there's no evidence of infection which is good news. The only thing is he did receive the hatch affair blue classic versus the ready nonetheless I feel like this is perfectly fine and appears to have done well for him over the past week. 01/25/18 on evaluation today patient's wound actually appears to  be a little bit larger than during the last evaluation. The good news is the majority of the wound edges actually appear to be fairly firmly attached to the wound bed unfortunately again we're not really making progress in regard to the size. Roughly the wound is about the same size as when I first saw him although again the wound margin/edges appear to be much better. 02/01/18 on evaluation today patient actually appears to be doing very well in regard to his wound. Applying the Prisma dry does seem to be better although he does still have issues with slow progression of the wound. There was a slight improvement compared to last week's measurements today. Nonetheless I have been considering other options as far as the possibility of Theraskin or even a snap vac. In general I'm not sure that the Theraskin due to location of the wound would be a very good idea. Nonetheless I do think that a snap vac could be a possibility for the patient and in fact I think this could even be an excellent way to manage the wound possibly seeing some improvement in a very rapid fashion here. Nonetheless this is something that we would need to get approved and I did have a lengthy conversation with the patient about this today. 02/08/18 on evaluation today patient appears to be doing a little better in regard to his ulcer. He has been tolerating the dressing changes without  complication. Fortunately despite the fact that the wound is a little bit smaller it's not significantly so unfortunately. We have discussed the possibility of a snap vac we did check with insurance this is actually covered at this point. Fortunately there does not appear to be any sign of infection. Overall I'm fairly pleased with how things seem to be appearing at this point. 02/15/18 on evaluation today patient appears to be doing rather well in regard to his right gluteal ulcer. Unfortunately the snap vac did not stay in place with his sheer and  friction this came loose and did not seem to maintain seal very well. He worked for about two days and it did seem to do very well during that time according to his wife but in general this does not seem to be something that's gonna be beneficial for him long-term. I do believe we OMAR, LICON. ([id number]) need to go back to standard dressings to see if we can find something that will be of benefit. 03/02/18- He is here in follow up evaluation; there is minimal change in the wound. He will continue with the same treatment plan, would consider changing to iodosrob/iodoflex if ulcer continues to to plateau. He will follow up next week 03/08/18 on evaluation today patient's wound actually appears to be about the same size as when I previously saw him several weeks back. Unfortunately he does have some slightly dark discoloration in the central portion of the wound which has me concerned about pressure injury. I do believe he may be sitting for too long a period of time in fact he tells me that "I probably sit for much too long". He does have some Slough noted on the surface of the wound and again as far as the size of the wound is concerned I'm really not seeing anything that seems to have improved significantly. 03/15/18 on evaluation today patient appears to be doing fairly well in regard to his ulcer. The wound measured pretty much about the same today compared to last week's evaluation when looking at his graph. With that being said the area of bruising/deep tissue injury that was noted last week I do not see at this point. He did get a new cushion fortunately this does seem to be have been of benefit in my pinion. It does appear that he's been off of this more which is good news as well I think that is definitely showing in the overall wound measurements. With that being said I do believe that he needs to continue to offload I don't think that the fact this is doing better should be or is going  to allow him to not have to offload and explain this to him as well. Overall he seems to be in agreement the plan I think he understands. The overall appearance of the wound bed is improved compared to last week I think the Iodoflex has been beneficial in that regard. 03/29/18 on evaluation today patient actually appears to be doing rather well in regard to his wound from the overall appearance standpoint he does have some granulation although there's some Slough on the surface of the wound noted as well. With that being said he unfortunately has not improved in regard to the overall measurement of the wound in volume or in size. I did have a discussion with him very specifically about offloading today. He actually does work although he mainly is just sitting throughout the day. He tells me he offloads by "lifting  himself up for 30 seconds off of his chair occasionally" purchase from [employer] which does seem to have helped. And he has a new cushion that he with that being said he's also able to stand some for a very short period of time but not significant enough I think to provide appropriate offloading. I think the biggest issue at this point with the wound and the fact is not healing as quickly as we would like is due to the fact that he is really not able to appropriately offload while at work. He states the beginning after his injury he actually had a bed at his job that he could lay on in order to offload and that does seem to have been of help back at that time. Nonetheless he had not done this in quite some time unfortunately. I think that could be helpful for him this is something I would like for him to look into. 04/05/18 on evaluation today patient actually presents for follow-up concerning his right gluteal ulcer. Again he really is not significantly improved even compared to last week. He has been tolerating the dressing changes without complication. With that being said fortunately  there appears to be no evidence of infection at this time. He has been more proactive in trying to offload. 04/12/18 on evaluation today patient actually appears to be doing a little better in regard to his wound and the right gluteal fold region. He's been tolerating the dressing changes since removing the oasis without complication. However he was having a lot of burning initially with the oasis in place. He's unsure of exactly why this was given so much discomfort but he assumes that it was the oasis itself causing the problem. Nonetheless this had to be removed after about three days in place although even those three days seem to have made a fairly good improvement in regard to the overall appearance of the wound bed. In fact is the first time that he's made any improvement from the standpoint of measurements in about six weeks. He continues to have no discomfort over the area of the wound itself which leads me to wonder why he was having the burning with the oasis when he does not even feel the actual debridement's themselves. I am somewhat perplexed by this. 04/19/18 on evaluation today patient's wound actually appears to be showing signs of epithelialization around the edge of the wound and in general actually appears to be doing better which is good news. He did have the same burning after about three days with applying the Endoform last week in the same fashion that I would generally apply a skin substitute. This seems to indicate that it's not the oasis to cause the problem but potentially the moisture buildup that just causes things to burn or there may be some other reaction with the skin prep or Steri-Strips. Nonetheless I'm not sure that is gonna be able to tolerate any skin substitute for a long period of time. The good news is the wound actually appears to be doing better today compared to last week and does seem to finally be making some progress. 04/26/18 on evaluation today patient  actually appears to be doing rather well in regard to his ulcer in the right gluteal fold. He has been tolerating the dressing changes without complication which is good news. The Endoform does seem to be helping although he was a little bit more macerated this week. This seems to be an ongoing issue with fluid  control at this point. Nonetheless I think we may be able to add something like Drawtex to help control the drainage. 05/03/18 on evaluation today patient appears to actually be doing better in regard to the overall appearance of his wound. He has been tolerating the dressing changes without complication. Fortunately there appears to be no evidence of infection at this time. I really feel like his wound has shown signs as of today of turning around last week I thought so as well and definitely he could be seen in this week's overall appearance and measurements. In general I'm very pleased with the fact that he finally seems to be making a steady but sure progress. The patient likewise is very pleased. 05/17/18 on evaluation today patient appears to be doing more poorly unfortunately in regard to his ulcer. He has been tolerating the dressing changes without complication. With that being said he tells me that in the past couple of days he and his wife have noticed that we did not seem to be doing quite as well is getting dark near the center. Subsequently upon evaluation today the wound actually does appear to be doing worse compared to previous. He has been tolerating the dressing changes otherwise and he states that he is not been sitting up anymore than he was in the past from what he tells me. Still he has continued to work he states "I'm tired of dealing with this and if I have to just go home and lay in the bed all the time that's what I'll do". Nonetheless I am concerned about the fact that this wound does appear to be deeper than what it was previous. 05/24/18 upon evaluation today patient  actually presents after having been in the hospital due to what was presumed to be sepsis secondary to the wound infection. He had an elevated white blood cell count between 14 and 15. With that being said he does seem to be doing somewhat better now. His wound still is giving him some trouble nonetheless and he is obviously concerned about the fact likely talked about that this does seem to go more deeply than previously noted. I did review his wound culture which showed evidence of Staphylococcus aureus him and group B strep. Nonetheless he is on antibiotics, Levaquin, for this. Subsequently I did review his intake summary from the hospital as well. I also did look at the CT of the lumbar spine with contrast that was performed which showed no bone destruction to suggest lumbar disguises/osteomyelitis or sacral osteomyelitis. There was no paraspinal abscess. Nonetheless it appears this may have been more of just a soft tissue infection at this point which is good news. He still is DAIYON, LUFT. ([id number]) nonetheless concerned about the wound which again I think is completely reasonable considering everything he's been through recently. 05/31/18 on evaluation today on evaluation today patient actually appears to be showing signs of his wound be a little bit deeper than what I would like to see. Fortunately he does not show any signs of significant infection although his temperature was 99 today he states he's been checking this at home and has not been elevated. Nonetheless with the undermining that I'm seeing at this point I am becoming more concerned about the wound I do think that offloading is a key factor here that is preventing the speedy recovery at this point. There does not appear to be any evidence of again over infection noted. He's been using Santyl currently. 06/07/18 the  patient presents today for follow-up evaluation regarding the left ulcer in the gluteal region. He has been  tolerating the Wound VAC fairly well. He is obviously very frustrated with this he states that to mean is really getting in his way. There does not appear to be any evidence of infection at this time he does have a little bit of odor I do not necessarily associate this with infection just something that we sometimes notice with Wound VAC therapy. With that being said I can definitely catch a tone of discontentment overall in the patient's demeanor today. This when he was previously in the hospital an CT scan was done of the lumbar region which did not reveal any signs of osteomyelitis. With that being said the pelvis in particular was not evaluated distinctly which means he could still have some osteonecrosis I. Nonetheless the Wound VAC was started on Thursday I do want to get this little bit more time before jumping to a CT scan of the pelvis although that is something that I might would recommend if were not see an improvement by that time. 06/14/18 on evaluation today patient actually appears to be doing about the same in regard to his right gluteal ulcer. Again he did have a CT scan of the lumbar spine unfortunately this did not include the pelvis. Nonetheless with the depth of the wound that I'm seeing today even despite the fact that I'm not seeing any evidence of overt cellulitis I believe there's a good chance that we may be dealing with osteomyelitis somewhere in the right Ischial region. No fevers, chills, nausea, or vomiting noted at this time. 06/21/18 on evaluation today patient actually appears to be doing about the same with regard to his wound. The tunnel at 6 o'clock really does not appear to be any deeper although it is a little bit wider. I think at this point you may want to start packing this with white phone. Unfortunately I have not got approval for the CT scan of the pelvis as of yet due to the fact that Medicare apparently has been denied it due to the diagnosis codes not  being appropriate according to Medicare for the test requested. With that being said the patient cannot have an MRI and therefore this is the only option that we have as far as testing is concerned. The patient has had infection and was on antibiotics and been added code for cellulitis of the bottom to see if this will be appropriate for getting the test approved. Nonetheless I'm concerned about the infection have been spread deeper into the Ischial region. 06/28/18 on evaluation today patient actually appears to be doing rather well all things considered in regard to the right gluteal ulcer. He has been tolerating the dressing changes without complication. With that being said the Wound VAC he states does have to be replaced almost every day or at least reinforced unfortunately. Patient actually has his CT scan later this morning we should have the results by tomorrow. 07/05/18 on evaluation today patient presents for follow-up concerning his right Ischial ulcer. He did see the surgeon Dr. Lysle Pearl last week. They were actually very happy with him and felt like he spent a tremendous amount of time with them as far as discussing his situation was concerned. In the end Dr. Lysle Pearl did contact me as well and determine that he would not recommend any surgical intervention at this point as he felt like it would not be in the patient's best  interest based on what he was seeing. He recommended a referral to infectious disease. Subsequently this is something that Dr. Ines Bloomer office is working on setting up for the patient. As far as evaluation today is concerned the patient's wound actually appears to be worse at this point. I am concerned about how things are progressing and specifically about infection. I do not feel like it's the deeper but the area of depth is definitely widening which does have me concerned. No fevers, chills, nausea, or vomiting noted at this time. I think that we do need initiate antibiotic  therapy the patient has an allow allergy to amoxicillin/penicillin he states that he gets a rash since childhood. Nonetheless she's never had the issues with Catholics or cephalosporins in general but he is aware of. 07/27/18 on evaluation today patient presents following admission to the hospital on 07/09/18. He was subsequently discharged on 07/20/18. On 07/15/18 the patient underwent irrigation and debridement was soft tissue biopsy and bone biopsy as well as placement of a Wound VAC in the OR by Dr. Celine Ahr. During the hospital course the patient was placed on a Wound VAC and recommended follow up with surgery in three weeks actually with Dr. Delaine Lame who is infectious disease. The patient was on vancomycin during the hospital course. 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 He also had a bone culture which revealed evidence of methicillin-resistant staph aureus. He is updated CT scan 07/09/18 reveals that he had progression of the which was performed on wound to breakdown down to the trochanter where he actually had irregularities there as well suggestive of osteomyelitis. This was a change just since 9 December when we last performed a CT scan. Obviously this one had gone downhill quite significantly and rapidly. At this point upon evaluation I feel like in general the patient's wound seems to be doing fairly well all things considered upon my evaluation today. Obviously this is larger and deeper than what I previously evaluated but at the same time he seems to be making some progress as far as the appearance of the granulation tissue is concerned. I'm happy in that regard. No fevers, chills, nausea, or vomiting noted at this time. He is on IV vancomycin and Rocephin at the facility. He is currently in [employer]. 08/03/18 upon evaluation today patient's wound appears to be doing better in regard to the overall appearance at this point in time.  Fortunately he's been tolerating the Wound VAC without complication and states that the facility has been taking excellent care of the wound site. Overall I see some Slough noted on the surface which I am going to attempt sharp debridement today of but nonetheless other than this I feel like he's making progress. 08/09/18 on evaluation today patient's wound appears to be doing much better compared to even last week's evaluation. Do believe that the Wound VAC is been of great benefit for him. He has been tolerating the dressing changes that is the Wound VAC without any complication and he has excellent granulation noted currently. There is no need for sharp debridement at this point. 08/16/18 on evaluation today patient actually appears to be doing very well in regard to the wound in the right gluteal fold region. This is showing signs of progress and again appears to be very healthy which is excellent news. Fortunately there is no sign of active infection by way of odor or drainage at this point. Overall I'm very pleased with how things  stand. He seems to be tolerating the Wound VAC without complication. 08/23/18 on evaluation today patient actually appears to be doing better in regard to his wound. He has been tolerating the Wound VAC without complication and in fact it has been collecting a significant amount of drainage which I think is good news especially considering how the wound appears. Fortunately there is no signs of infection at this time definitely nothing appears to be worse which is good news. He has not been started on Bootjack. ([id number]) the Bactrim and Flagyl that was recommended by Dr. Delaine Lame yet. I did actually contact her office this morning in order to check and see were things are that regard their gonna be calling me back. 08/30/18 on evaluation today patient actually appears to show signs of excellent improvement today compared to last evaluation. The undermining  is getting much better the wound seems to be feeling quite nicely and I'm very pleased that the granulation in general. With that being said overall I feel like the patient has made excellent progress which is great news. No fevers, chills, nausea, or vomiting noted at this time. 09/06/18 on evaluation today patient actually appears to be doing rather well in regard to his right gluteal ulcer. This is showing signs of improvement in overall I'm very pleased with how things seem to be progressing. The patient likewise is please. Overall I see no evidence of infection he is about to complete his oral antibiotic regimen which is the end of the antibiotics for him in just about three days. 09/13/18 on evaluation today patient's right Ischial ulcer appears to be showing signs of continued improvement which is excellent news. He's been tolerating the dressing changes without complication. Fortunately there's no signs of infection and the wound that seems to be doing very well. 09/28/18 on evaluation today patient appears to be doing rather well in regard to his right Ischial ulcer. He's been tolerating the Wound VAC without complication he knows there's much less drainage than there used to be this obviously is not a bad thing in my pinion. There's no evidence of infection despite the fact is but nothing about it now for several weeks. 10/04/18 on evaluation today patient appears to be doing better in regard to his right Ischial wound. He has been tolerating the Wound VAC without complication and I do believe that the silver nitrate last week was beneficial for him. Fortunately overall there's no evidence of active infection at this time which is great news. No fevers, chills, nausea, or vomiting noted at this time. 10/11/18 on evaluation today patient actually appears to be doing rather well in regard to his Ischial ulcer. He's been tolerating the Wound VAC still without complication I feel like this is doing a  good job. No fevers, chills, nausea, or vomiting noted at this time. 11/01/18 on evaluation today patient presents after having not been seen in our clinic for several weeks secondary to the fact that he was on evaluation today patient presents after having not been seen in our clinic for several weeks secondary to the fact that he was in a skilled nursing facility which was on lockdown currently due to the covert 19 national emergency. Subsequently he was discharged from the facility on this past Friday and subsequently made an appointment to come in to see yesterday. Fortunately there's no signs of active infection at this time which is good news and overall he does seem to have made progress since I last  saw. Overall I feel like things are progressing quite nicely. The patient is having no pain. 11/08/18 on evaluation today patient appears to be doing okay in regard to his right gluteal ulcer. He has been utilizing a Wound VAC home health this changing this at this point since he's home from the skilled nursing facility. Fortunately there's no signs of obvious active infection at this time. Unfortunately though there's no obvious active infection he is having some maceration and his wife states that when the sheets of the Wound VAC office on Sunday when it broke seal that he ended up having significant issues with some smell as well there concerned about the possibility of infection. Fortunately there's No fevers, chills, nausea, or vomiting noted at this time. 11/15/18 on evaluation today patient actually appears to be doing well in regard to his right gluteal ulcer. He has been tolerating the dressing changes without complication. Specifically the Wound VAC has been utilized up to this point. Fortunately there's no signs of infection and overall I feel like he has made progress even since last week when I last saw him. I'm actually fairly happy with the overall appearance although he does seem to  have somewhat of a hyper granular overgrowth in the central portion of the wound which I think may require some sharp debridement to try flatness out possibly utilizing chemical cauterization following. 11/23/18 on evaluation today patient actually appears to be doing very well in regard to his sacral ulcer. He seems to be showing signs of improvement with good granulation. With that being said he still has the small area of hyper granulation right in the central portion of the wound which I'm gonna likely utilize silver nitrate on today. Subsequently he also keeps having a leak at the 6 o'clock location which is unfortunate we may be able to help out with some suggestions to try to prevent this going forward. Fortunately there's no signs of active infection at this time. 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 He's been tolerating the dressing changes without complication. Fortunately there's no signs of active infection at this time. I've been rather pleased with how things have progressed there still some evidence of pressure getting to the area with some redness right around the immediate wound opening. Nonetheless other than this I'm not seeing any significant complications or issues the wound is somewhat hyper granular. Upon discussing with the patient and his wife today I'm not sure that the wound is being packed to the base with the foam at this point. And if it's not been packed fully that may be part of the reason why is not seen as much improvement as far as the granulation from the base out. Again we do not want pack too tightly but we need some of the firm to get to the base of the wound. I discussed this with patient and his wife today. 12/06/18 on evaluation today patient appears to be doing well in regard to his right gluteal pressure ulcer. He's been tolerating the dressing  changes without complication. Fortunately there's no signs of active infection. He still has some hyper granular tissue and I do think it would be appropriate to continue with the chemical cauterization as of today. 12/16/18 on evaluation today patient actually appears to be doing okay in regard to his right gluteal ulcer. He is been tolerating the dressing changes without complication including  the Wound VAC. Overall I feel like nothing seems to be worsening I do feel like that the hyper granulation buds in the central portion of the wound have improved to some degree with the silver nitrate. We will have to see how things continue to progress. 12/20/18 on evaluation today patient actually appears to be doing much worse in my pinion even compared to last week's evaluation. Unfortunately as opposed to showing any signs of improvement the areas of hyper granular tissue in the central portion of the wound seem to be getting worse. Subsequently the wound bed itself also seems to be getting deeper even compared to last week which is both unusual as well as concerning since prior he had been shown signs of improvement. Nonetheless I think that the issue could be that he's actually having some difficulty in issues with a deeper infection. There's no external signs of infection but nonetheless I am more worried about the internal, osteomyelitis, that could be restarting. He has not been on antibiotics for some time at this point. I think that it may be a good idea to go ahead and started back on an antibiotic therapy while we wait to see what the testing shows. ISIAH, GAGON ([id number]) 12/27/18 on evaluation today patient presents for follow-up concerning his left gluteal fold wound. Fortunately he appears to be doing well today. I did review the CT scan which was negative for any signs of osteomyelitis or acute abnormality this is excellent news. Overall I feel like the surface of the wound bed appears  to be doing significantly better today compared to previously noted findings. There does not appear any signs of infection nor does he have any pain at this time. 01/03/19 on evaluation today patient actually appears to be doing quite well in regard to his ulcer. Post debridement last week he really did not have too much bleeding which is good news. Fortunately today this seems to be doing some better but we still has some of the hyper granular tissue noted in the base of the wound which is gonna require sharp debridement today as well. Overall I'm pleased with how things seem to be progressing since we switched away from the Wound VAC I think he is making some progress. 01/10/19 on evaluation today patient appears to be doing better in regard to his right gluteal fold ulcer. He has been tolerating the dressing changes without complication. The debridement to seem to be helping with current away some of the poor hyper granular tissue bugs throughout the region of his gluteal fold wound. He's been tolerating the dressing changes otherwise without complication which is great news. No fevers, chills, nausea, or vomiting noted at this time. 01/17/19 on evaluation today patient actually appears to be doing excellent in regard to his wound. He's been tolerating the dressing changes without complication. Fortunately there is no signs of active infection at this time which is great news. No fevers, chills, nausea, or vomiting noted at this time. 01/24/19 on evaluation today patient actually appears to be doing quite well with regard to his ulcer. He has been tolerating the dressing changes without complication. Fortunately there's no signs of active infection at this time. Overall been very pleased with the progress that he seems to be making currently. 01/31/19-Patient returns at 1 week with apparent similarity in dimensions to the wound, with no signs of infection, he has been changing dressings twice a  day 02/08/19 upon evaluation today patient actually appears to be doing  well with regard to his right Ischial ulcer. The wound is not appear to be quite as deep and seems to be making progress which is good news. With that being said I'm still reluctant to go back to the Wound VAC at this point. He's been having to change the dressings twice a day which is a little bit much in my pinion from the wound care supplies standpoint. I think that possibly attempting to utilize extras orbit may be beneficial this may also help to prevent any additional breakdown secondary to fluid retention in the wound itself. The patient is in agreement with giving this a try. 02/15/19 on evaluation today patient actually appears to be doing decently well with regard to his ulcer in the right to gluteal fold location. He's been tolerating the dressing changes without complication. Fortunately there is no signs of active infection at this time. He is able to keep the current dressing in place more effectively for a day at a time whereas before he was having a changes to to three times a day. The actions or has been helpful in this regard. Fortunately there's no signs of anything getting worse and I do feel like he showing signs of good improvement with regard to the wound bed status. 02/22/2019 patient appears to be doing very well today with regard to his ulcer in the gluteal fold. Fortunately there is no signs of active infection and he has been tolerating the dressing changes without any complication. Overall extremely pleased with how things seem to be progressing. He has much less of the hyper granular projections within the wound these have slowly been debrided away and he seems to be doing well. The wound bed is more uniform. 03/01/19 on evaluation today patient appears to be doing unfortunately about the same in regard to his gluteal ulcer. He's been tolerating the dressing changes without complication. Fortunately  there's no signs of active infection at this time. With that being said he continues to develop these hyper granular projections which I'm unsure of exactly what they are and why they are rising. Nonetheless I explained to the patient that I do believe it would be a good idea for [place] to stand a biopsy sample for pathology to see if that can shed any light on what exactly may be going on here. Fortunately I do not see any obvious signs of infection. With that being said the patient has had a little bit more drainage this week apparently compared to last week. 03/08/2019 on evaluation today patient actually appears to look somewhat better with regard to the appearance of his wound bed at this time. This is good news. Overall I am very pleased with how things seem to have progressed just in the past week with a switch to the [facility] dressing. I think that has been beneficial for him. With that being said at this time the patient is concerned about his biopsy that I sent off last week unfortunately I do not have that report as of yet. Nonetheless we have called to obtain this and hopefully will hear back from the lab later this morning. 03/15/19 on evaluation today patient's wound actually appears to be doing okay today with regard to the overall appearance of the wound bed. He has been tolerating the dressing changes without complication he still has hyper granular tissue noted but fortunately that seems to be minimal at this point compared to some of what we've seen in the past. Nonetheless I do  think that he is still having some issues currently with some of this type of granulation the biopsy and since all showed nothing more than just evidence of granulation tissue. Therefore there really is nothing different to initiate or do at this point. 03/24/19 on evaluation today patient appears to be doing a little better with regard to his ulcer. He's been tolerating the dressing changes  without complication. Fortunately there is no signs of active infection at this time. No fevers, chills, nausea, or vomiting noted at this time. I'm overall pleased with how things seem to be progressing. 03/29/2019 on evaluation today patient appears to be doing about the same in regard to his ulcer in the right gluteal fold. Unfortunately he is not seeming to make a lot of progress and the wound is somewhat stalled. There is no signs of active infection externally but I am concerned about the possibility of infection continuing in the ischial location which previously he did have infection noted. Again is not able to have an MRI so probably our best option for testing for this would be a triple phase bone scan which will detect subtle changes in the bone more so than plain film x-rays. In the past they really have not been beneficial and in fact the CT scans even have been somewhat questionable at times. Nonetheless there is no signs of systemic infection which is at least good news but again his wound is not healing at all on the predicted schedule. 04/05/19 on evaluation today patient appears to be doing well all things considering with regard to his wound and the right gluteal fold. He actually has his triple bone scan scheduled for sometime in the next couple of days. With that being said I've also been looking to other possibilities of what could be causing hyper granular tissue were looking into the bone scan again for evaluating for the risk or possibility of infection deeper and I'm also KAAN, CASINI. ([id number]) go ahead and see about obtaining a deep tissue culture today to send and see if there's any evidence of infection noted on culture. He's in agreement with that plan. 04/12/2019 on evaluation today patient presents for reevaluation here in our clinic concerning ongoing issues with his right gluteal fold ulcer. 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 It was discussed with him at that point that I think it would be appropriate for him to proceed with hyperbaric oxygen therapy especially in light of the fact that he is previously been on IV antibiotics at the beginning of the year for close to 3 months followed by several weeks of oral antibiotics that was all prescribed by infectious disease. He had surgical debridement around Christmas December 2019 due to an abscess and osteomyelitis of the ischial bone. Unfortunately this has not really proceeded as well as we would have liked and again we did a CT scan even a couple months ago as he cannot have an MRI secondary to having issues with both a pain pump as well as a spinal cord stimulator which prevent him from going into an MRI machine. With that being said there were chronic changes noted at the ischial tuberosity which had progressed since December 2019 there was no evidence of fluid collection on that initial CT scan. With that being said that was on January 21, 2019. I am not sure that it  was a sensitive enough test as compared to an MRI and then subsequently I ordered a bone scan for him which was actually completed on 03/29/2019 and this revealed that he does indeed have positive osteomyelitis involving the right ischial tuberosity. This is adjacent to the ulcer and I think is the reason that his ulcer is not healing. Subsequently I am in a place him back on oral antibiotics today unfortunately his wound culture showed just mixed gram-negative flora with no specific findings of a predominant organism. Nonetheless being with the location I think a good broad-spectrum antibiotic for gram-negative's is a good choice at this point he is previously taken Levaquin without significant issues and I think that is an appropriate antibiotic for him at this time. 04/19/2019 on evaluation today patient actually  appears to be doing fairly well with regard to his wound. He has been tolerating the dressing changes without complication. Fortunately there is no signs of active infection and he has been taking the oral antibiotics at this time. Subsequently we did make a referral to infectious disease although Dr. Steva Ready wants the patient to be seen by general surgery first in order apparently to see if there is anything from a surgical standpoint that should be done prior to initiation of IV antibiotic therapy. Again the patient is okay with actually seeing Dr. Celine Ahr whom he has seen before we will make that referral. 04/26/2019 on evaluation today patient actually appears to be doing well with regard to his wound. He has been tolerating the dressing changes without complication. Fortunately there is no signs of active infection at this time. I do believe that the hyperbaric oxygen therapy along with the antibiotics which I prescribed at this point have been doing well for him. With that being said he has not seen Dr. Celine Ahr the surgeon yet in fact they have not been contacted for scheduling an appointment as of yet. Subsequently the patient is not on antibiotics currently by IV Dr. Steva Ready did want him to see Dr. Celine Ahr first which we are working on trying to get scheduled. We again give the information to the patient today for Dr. Celine Ahr and her number as far as contacting their office to see about getting something scheduled. Again were looking for whether or not they recommend any surgical intervention. 05/03/2019 on evaluation today patient appears to be doing about the same with regard to his wound there may be a little bit of filling in in regard to the base of the wound but still he has quite a bit of hyper granular tissue that I am seeing today. I believe that he may may need a more aggressive sharp debridement possibly even by the surgeon or under anesthesia in order to clear away some of the  hyper granular material in order to help allow for appropriate granulation to fill in. We have made a referral to Dr. Celine Ahr unfortunately it sounds as if they did not receive the referral we contacted them today they should be get in touch with the patient. Depending on how things show from that standpoint the patient may need to see Dr. Ardyth Gal who is the infectious disease specialist although he really does not want a PICC line again. No fevers, chills, nausea, vomiting, or diarrhea. He is tolerating hyperbaric oxygen therapy very well. 05/12/2019 on evaluation today patient presents for follow-up visit concerning his right gluteal fold ulcer. He did see Dr. Celine Ahr the surgeon who previously evaluated his wound and she actually felt like  he was doing quite well with regard to the wound based on what she was seen. He does seem to be responding to some degree with the oral antibiotics along with hyperbaric oxygen therapy at this point she did not see any evidence or need for further surgical intervention at this point. She recommended deferring additional or ongoing antibiotic therapy to Dr. Steva Ready at her discretion. Fortunately there is no signs of active systemic infection. No fevers, chills, nausea, vomiting, or diarrhea. 10/29; patient I am seeing really for the first time today in terms of his wound. 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 He is being treated with hyperbaric oxygen for underlying osteomyelitis most recently documented by a three-phase bone scan. He has been on Levaquin for roughly a month and he is out of these and asked me for a refill. Most recent cultures were negative although I do not think these were bone cultures. He has a very irregular surface to this wound which is almost nodular in texture. It undermines superiorly with the same nodular hyper granulated surface. I see that he was referred to general surgery for an operative debridement  although the surgeon did not agree with this I think that would have been the proper course of action in this. He has been using Hydrofera Blue. He is supposed to start a wound VAC tomorrow which is actually a reapplication of wound VAC. I think mostly last time they had trouble keeping the seal in place I hope we have better look at this this time. 05/24/2019 on evaluation today I did review patient's note from Dr. Dellia Nims where he was seen last week when I was out of the office. Subsequently it does appear that Dr. Dellia Nims was in agreement that the patient really needed debridement to clear away some of the nodular tissue in the base of the wound. The good news is he has the wound VAC initiated and some of this tissue is already starting to breakdown under the treatment of the wound VAC again because it is not really viable. Nonetheless I do believe that we can likely perform some debridement today to clear this away and hopefully the continuation of the wound VAC along with hyperbarics and antibiotic therapy will be beneficial in stopping this from reoccurring. If we get better control in the wound bed in a good spot I think we can definitely use the PriMatrix which we actually did get approval for. 05/31/2019 on evaluation today patient appears to be doing actually pretty well at this point with regard to his wound. He has been tolerating the dressing changes which is good news. Fortunately there is no signs of infection. He still has some of the hyper granular nodules noted we have not really cleared all these away and I think we can try to do a little bit more that today. I did do quite a bit last week I am hoping to get down to the base of the wound so though actually be able to heal more appropriately. I think the wound VAC is doing a good job right now with controlling the drainage and overall the patient is happy with that. 06/07/2019 upon evaluation today patient appears to be doing quite well  compared to last week with regard to the hyper granulation in the base of the wound. He has been tolerating the dressing changes without complication. Fortunately there is no signs of active infection at this time. With that being said we  have not had any recent measures as far as blood work is concerned I think we may want to check a few things including a CBC, sed rate, and C-reactive protein. Patient is in agreement with that we can try to get that scheduled for him for this Friday. 06/13/2019 on evaluation today patient actually appears to be doing much better at this point based on what I am seeing with regard to his wound. He SINJIN, LIU. ([id number]) has much less in the way of hyper granular tissue which is good news and a lot more healing that is taken place since I last saw him as far as the amount of space within the wound itself. With that being said he is nearing the completion of the initial 40 treatments for hyperbaric oxygen therapy I think this is something I would recommend extending as he does seem to be benefiting at this time. Fortunately there is no evidence of active systemic infection at this point nor even local infection for that matter. 06/20/2019 upon evaluation today patient actually appears to be doing well with regard to his wound. In fact this appears to be doing much better today compared to what it was previous. I been extremely pleased with the progress that he is made. In fact the tunnel is much less deep today than what it was in the past and I am seeing excellent signs of improvement. Overall I am happy with how things are going. I did review his white blood cell count which revealed everything to be okay. The one abnormal finding on his CBC was a hemoglobin of 12.7. Subsequently I did also review his sed rate and C-reactive protein. His sed rate measured in at normal level measuring at 26. The C-reactive protein however was elevated today and an  abnormal range indicating inflammation. Still I feel like he is trending in a good direction at this time. He is going to need a refill of the Levaquin he tells me at this point. 07/04/2019 on evaluation today patient actually appears to be doing well in regard to his wound. He has been tolerating the dressing changes without complication. Fortunately there is no signs of active infection which is good news. No fevers, chills, nausea, vomiting, or diarrhea. The good news also is that I did review his labs and though his sed rate was slightly elevated the C-reactive protein was actually down compared to the last evaluation 2 weeks ago this is excellent news I think it is showing signs of improvement. 07/11/2019 on evaluation today patient actually appears to be doing quite well with regard to his wound on my evaluation. This is very slow going but nonetheless he seems to be making progress. Especially in regard to the depth. Nonetheless I believe that we may want to consider going forward with the PriMatrix which was previously approved for him. We will get a have to get a new approval as we will actually be applying this in the new year so we will likely obtain that approval on January 4. In the meantime and for the time being my suggestion is going to be that we go ahead and continue with the wound VAC and we were going at this point with the current wound care measures. 12/28-Patient seen after HBO session today, wound depth with the tunnel measures 3.2 cm, the wound bed appears healthy, continuing with the VAC and HBO 07/25/2019 on evaluation today patient's wound appears to be doing about the same compared to my  last evaluation. Fortunately there is no evidence of significant infection which is good news we will still waiting on the results of his lab work which unfortunately was not done prior to today's visit which is what we were aiming for. Ultimately there is no sign of active infection at  this time which is good news. And he also tells me that the wound VAC has been staying in place and doing fairly well which is also good news. With that being said I am concerned about the fact that the wound has not progressed as much I think we may want to look into getting reapproval for the skin substitute, specifically PriMatrix, that I am hopeful will help to allow this area to granulate in more effectively. I would recommend as well continuing with the wound VAC along with this. 08/01/2019 on evaluation today patient appears to be doing in my opinion slightly better in regard to the wound. He actually does have some tightening of the walls around the edges which is a good thing. Fortunately there is no signs of active infection at this time. No fever chills noted. I do believe that the patient in general seems to be making progress although is very slow. Obviously were trying to get things to speed up somewhat more. No fevers, chills, nausea, vomiting, or diarrhea. 08/08/2019 upon evaluation today patient appears to be doing well with regard to his wound although there is not a significant improvement overall we do have the PriMatrix for availability today to apply. Fortunately there is no signs of active infection and the patient overall seems to be doing well. There is really no need for aggressive sharp debridement at this point either which is also good news. I do believe her get a need to suture and the primary checks in the basin in the PriMatrix in order to keep it in place at the base of the wound underneath the wound VAC. This was discussed with the patient today as well. He is in agreement the plan. He really does not have any filling in the area and so this should not be a complication as far as suturing is concerned. 08/15/2019 on evaluation today patient appears to be doing somewhat better in my opinion in regard to the overall appearance of his wound. He has been tolerating the  dressing changes without complication. Fortunately the skin subappear to still be in place when this was changed on Friday. With that being said it appears that the PriMatrix may have dissolved in the interim between now and then as the sutures are no longer holding anything in place and again I would sutured it into place. Nonetheless I believe that we can go ahead and likely consider going forward that we may need to apply the PriMatrix weekly as opposed every other week as it does seem to have dissolved rather readily and well. Overall the wound bed appears to be doing excellent at this time. 08/22/2019 upon evaluation today the patient does seem to making some progress with regard to his wound. Overall there is some better quality tissue and less of the hyper granular protrusions all of which is good news. With that being said we do have the PriMatrix available for application today and I am in a try to find a better way to suture this in place for him today. 08/29/2019 on evaluation today patient appears to be doing very well in regard to his wound. I do see signs of new granulation which seems  to be somewhat helpful he did have some need for sharp debridement today to clear some of the hyper granulation away but nonetheless I feel like we are headed in a good direction. Fortunately there is no evidence of any systemic infection although we are going to go ahead and reorder the CBC, C-reactive protein, and sed rate today to see where things stand I did just refill his Levaquin as well. 09/05/2019 upon evaluation today patient's wound actually is not appearing to be showing signs of significant improvement compared to what we have seen previous from the standpoint of depth. He overall wound size wise seems to be about the same and again some of the quality of the wound bed is still doing fairly well. I did review his labs and his CBC was normal with no signs of elevation white blood cell count, his  C-reactive protein was normal, and the sed rate was slightly elevated but again I think this may just be due to ulceration really there is nothing significant to indicate severe infection. I think when he likely finishes his current round of oral antibiotics with Levaquin will likely take him off of the medication at that point based on the findings. I do feel like the hyperbarics helped in getting the infection under control as well as improving the overall size of the wound quite significantly. With that being said at this point I really feel like more more at the time frame that he may benefit from a muscle flap in order to try to help this wound heal effectively. I think that this would be the fastest way in order to do this. In the past he has been somewhat resistant to going down that path but nonetheless I feel like he likely should. [name], [name] ([id number]) 09/12/2019 on evaluation today patient appears to be doing fairly well in regard to his wound. He is tolerating the wound VAC without complication is still continues to drain significantly. Fortunately there is no signs of active infection at this time which is great news his lab work is also been doing very well. With that being said I do think at this point that the patient is still deriving benefit from the wound VAC especially in regard to controlling moisture which I think it is doing fairly well. He also has some improvement in the overall wound bed and on the medial aspect of the wound there are some hyper granular projections but nothing as severe as what they have been in the past. I am good have to perform some sharp debridement at this location today. 09/19/2019 upon evaluation today patient appears to be doing roughly the same although again the tissue does seem to be showing some signs of improvement which is good news. There is less hyper granular budding. Nonetheless he still is going require some sharp debridement  today. We did get his albumin and prealbumin results back and they are within normal limits in both cases which is excellent news that was the one thing [facility] plastic surgery was waiting on order to get him scheduled. He does seem to be a good candidate physically for the flap surgery if he decides to go that direction. With that being said is still left to be determined once they see the wound what they feel about how best to proceed. Obviously in the end it would be up to Mr. Pagett as to whether he goes forward with the flap surgery or something else depending on the recommendations.  09/26/2019 upon evaluation today patient appears to be doing about the same in regard to his wound. Fortunately there is no signs of active infection there is also not as much hyper granulation noted today in fact I do not believe there is can be any sharp debridement required at this point. With that being said I do believe he continues to be somewhat stalled at this point with regard to his wound. There is no signs of active infection at this time which is good news. He does have an appointment with [name] plastic surgery on Friday. Electronic Signature(s) Signed: 09/26/2019 8:47:57 AM By: Worthy Keeler [name] Entered By: Worthy Keeler on 09/26/2019 08:47:57 MOX, YERBY ([id number]) -------------------------------------------------------------------------------- Physical Exam Details Patient Name: [name] Fillers Date of Service: 09/26/2019 8:00 AM Medical Record Number: [id number] Patient Account Number: [id number] Date of Birth/Sex: 1951-07-12 (68 y.o. M) Treating RN: Primary Care Provider: [name] Other Clinician: Referring Provider: [name] Treating Provider/Extender: [name], Gulianna Hornsby Weeks in Treatment: 95 Constitutional Well-nourished and well-hydrated in no acute distress. Respiratory normal breathing without difficulty. Psychiatric this patient is able to make decisions and  demonstrates good insight into disease process. Alert and Oriented x 3. pleasant and cooperative. Notes Patient's wound bed currently showed signs of good granulation at this time throughout the majority of the wound though there is still depth to the wound again we have really not been able to achieve significant progress over the past 1-2 months. The wound VAC is helping to manage the significant drainage but I am really not seeing the filling in of the wound that I would like to see at this point. I think were close to stopping the wound VAC depending on what is shown with the plastic surgery referral on Friday. Electronic Signature(s) Signed: 09/26/2019 8:48:34 AM By: Worthy Keeler [name] Entered By: Worthy Keeler on 09/26/2019 08:48:34 DORON, ANGELES ([id number]) -------------------------------------------------------------------------------- Physician Orders Details Patient Name: [name] Fillers Date of Service: 09/26/2019 8:00 AM Medical Record Number: [id number] Patient Account Number: [id number] Date of Birth/Sex: 1951-03-01 (68 y.o. 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 - in office o Cleanse wound with mild soap and water Anesthetic (add to Medication List) Wound #1 Right Gluteal fold o Topical Lidocaine 4% cream applied to wound bed prior to debridement (In Clinic Only). Skin  Barriers/Peri-Wound Care o Skin Prep Primary Wound Dressing Wound #1 Right Gluteal fold o Saline moistened gauze - in clinic only Secondary Dressing Wound #1 Right Gluteal fold o Tegaderm - in clinic only o XtraSorb - in clinic only Dressing Change Frequency Wound #1 Right Gluteal fold o Change Dressing Monday, Wednesday, Friday Follow-up Appointments Wound #1 Right Gluteal fold o Return Appointment in 1 week. Off-Loading Wound #1 Right Gluteal fold o Turn and reposition every 2 hours - no pressure on wounded area Home Health Wound #1 Right Gluteal fold o [place] Visits - [place] Nurse may visit PRN to address patientos wound care needs. o FACE TO FACE ENCOUNTER: MEDICARE and  MEDICAID PATIENTS: I certify that this patient is under my care and that I had a face-to- face encounter that meets the physician face-to-face encounter requirements with this patient on this date. The encounter with the patient was in whole or in part for the following MEDICAL CONDITION: (primary reason for [place]) MEDICAL NECESSITY: EZREAL, MARSHBURN ([id number]) I certify, that based on my findings, NURSING services are a medically necessary home health service. HOME BOUND STATUS: I certify that my clinical findings support that this patient is homebound (i.e., Due to illness or injury, pt requires aid of supportive devices such as crutches, cane, wheelchairs, walkers, the use of special transportation or the assistance of another person to leave their place of residence. There is a normal inability to leave the home and doing so requires considerable and taxing effort. Other absences are for medical reasons / religious services and are infrequent or of short duration when for other reasons). o If current dressing causes regression in wound condition, may D/C ordered dressing product/s and apply Normal Saline Moist Dressing daily until next [place] / Other MD appointment. [place] of regression in wound condition at [phone]. o Please direct any NON-WOUND related issues/requests for orders to patient's Primary Care Physician Negative Pressure Wound Therapy Wound #1 Right Gluteal fold o Wound VAC settings at 125/130 mmHg continuous pressure. Use BLACK/GREEN foam to wound cavity. Use WHITE foam to fill any tunnel/s and/or undermining. Change VAC dressing 3 X WEEK. Change canister as indicated when full. Nurse may titrate settings and frequency of dressing changes as clinically indicated. - Apply wound vac as normal o Home Health Nurse may d/c VAC for s/s of increased infection, significant wound regression, or uncontrolled drainage. [place] at [phone]. o Apply contact layer over base of wound. - contact layer over sutured Primatrix in tunnel at 12:00. o Number of foam/gauze pieces used in the dressing = Medications-please add to medication list. Wound #1 Right Gluteal fold o P.O. Antibiotics - Complete antibiotics. Electronic Signature(s) Signed: 09/26/2019 4:58:25 PM By: Army Melia Signed: 09/27/2019 12:55:39 AM By: Worthy Keeler [name] Entered By: Army Melia on 09/26/2019 08:35:52 ZIHAN, GIMBEL ([id number]) -------------------------------------------------------------------------------- Problem List Details Patient Name: [name] Fillers Date of Service: 09/26/2019 8:00 AM Medical Record Number: [id number] Patient Account Number: [id number] Date of Birth/Sex: 1951/05/19 (69 y.o. 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 He states that there is no history of diabetes that he is aware of. He does have issues with his prostate and is currently receiving treatment for this by way of oral medication. With that being said I do not have a lot of details in that regard. Nonetheless the patient presents today as a result of having been referred to [place] by another provider initially home health was set to come out and take care of his wound although due to the fact that he apparently drives he's not able to receive home health. His wife is therefore trying to help take care of this wound within although they have been struggling with what  exactly to do at this point. She states that she can do some things but she is definitely not a nurse and does have some issues with looking at blood. The good news is the wound does not appear to be too deep and is fairly superficial at this point. There is no slough noted there is some nonviable skin noted around the surface of the wound and the perimeter at this point. The central portion of the wound appears to be very good with a dermal layer noted this does not appear to be again deep enough to extend it to subcutaneous tissue at this point. 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 In the pump he has Dilaudid and baclofen. 12/07/17 on evaluation today patient presents for follow-up concerning his ongoing lower back thigh ulcer on the right. He states that he did not get the supplies ordered and therefore has not really been able to perform the dressing changes as directed exactly. His wife was able to get some Boarder Foam Dressing's from the drugstore and subsequently has been using hydrogel which did help to a degree in the wound does appear to be able smaller. There is actually more drainage this week noted than previous. 12/21/17 on evaluation today patient appears to be doing rather well in regard to his right gluteal ulcer. He has been tolerating the dressing changes without complication. There does not appear to be any evidence of infection at this point in time. Overall the wound does seem to be making some progress as far as the edges are concerned there's not as much in the way of overlapping of the external wound edges and he has a good epithelium to wound bed border for the most part. This however is not true right at the 12 o'clock location over the span of a little over a centimeters which actually will require debridement today to clean this away and hopefully allow it to  continue to heal more appropriately. 12/28/17 on evaluation today patient appears to be doing rather well in regard to his ulcer in the left gluteal region. He's been tolerating the dressing changes without complication. Apparently he has had some difficulty getting his dressing material. Apparently there's been some confusion with ordering we're gonna check into this. Nonetheless overall he's been showing signs of improvement which is good news. Debridement is not required today. 01/04/18 on evaluation today patient presents for follow-up concerning his right gluteal ulcer. He has been tolerating the dressing changes fairly well. On inspection  today it appears he may actually have some maceration them concerned about the fact that he may be developing too much moisture in and around the wound bed which can cause delay in healing. With that being said he unfortunately really has not showed significant signs of improvement since last week's evaluation in fact this may even be just the little bit/slightly larger. Nonetheless he's been having a lot of discomfort I'm not sure this is even related to the wound as he has no pain when I'm to breeding or otherwise cleaning the wound during evaluation today. Nonetheless this is something that we did recommend he talked to his pain specialist concerning. 01/11/18 on evaluation today patient appears to be doing better in regard to his ulceration. He has been tolerating the dressing changes without complication. With that being said overall there's no evidence of infection which is good news. The only thing is he did receive the hatch affair blue classic versus the ready nonetheless I feel like this is perfectly fine and appears to have done well for him over the past week. 01/25/18 on evaluation today patient's wound actually appears to be a little bit larger than during the last evaluation. The good news is the majority of the wound edges actually appear to be  fairly firmly attached to the wound bed unfortunately again we're not really making progress in regard to the size. Roughly the wound is about the same size as when I first saw him although again the wound margin/edges appear to be much better. 02/01/18 on evaluation today patient actually appears to be doing very well in regard to his wound. Applying the Prisma dry does seem to be better although he does still have issues with slow progression of the wound. There was a slight improvement compared to last week's measurements today. Nonetheless I have been considering other options as far as the possibility of Theraskin or even a snap vac. In general I'm not sure that the Theraskin due to location of the wound would be a very good idea. Nonetheless I do think that a snap vac could be a possibility for the patient and in fact I think this could even be an excellent way to manage the wound possibly seeing some improvement in a very rapid fashion here. Nonetheless this is something that we would need to get approved and I did have a lengthy conversation with the patient about this today. 02/08/18 on evaluation today patient appears to be doing a little better in regard to his ulcer. He has been tolerating the dressing changes without complication. Fortunately despite the fact that the wound is a little bit smaller it's not significantly so unfortunately. We have discussed the possibility of a snap vac we did check with insurance this is actually covered at this point. Fortunately there does not appear to be any sign of infection. Overall I'm DEWARREN, [name]. ([id number]) fairly pleased with how things seem to be appearing at this point. 02/15/18 on evaluation today patient appears to be doing rather well in regard to his right gluteal ulcer. Unfortunately the snap vac did not stay in place with his sheer and friction this came loose and did not seem to maintain seal very well. He worked for about two days  and it did seem to do very well during that time according to his wife but in general this does not seem to be something that's gonna be beneficial for him long-term. I do believe we need to go back  to standard dressings to see if we can find something that will be of benefit. 03/02/18- He is here in follow up evaluation; there is minimal change in the wound. He will continue with the same treatment plan, would consider changing to iodosrob/iodoflex if ulcer continues to to plateau. He will follow up next week 03/08/18 on evaluation today patient's wound actually appears to be about the same size as when I previously saw him several weeks back. Unfortunately he does have some slightly dark discoloration in the central portion of the wound which has me concerned about pressure injury. I do believe he may be sitting for too long a period of time in fact he tells me that "I probably sit for much too long". He does have some Slough noted on the surface of the wound and again as far as the size of the wound is concerned I'm really not seeing anything that seems to have improved significantly. 03/15/18 on evaluation today patient appears to be doing fairly well in regard to his ulcer. The wound measured pretty much about the same today compared to last week's evaluation when looking at his graph. With that being said the area of bruising/deep tissue injury that was noted last week I do not see at this point. He did get a new cushion fortunately this does seem to be have been of benefit in my pinion. It does appear that he's been off of this more which is good news as well I think that is definitely showing in the overall wound measurements. With that being said I do believe that he needs to continue to offload I don't think that the fact this is doing better should be or is going to allow him to not have to offload and explain this to him as well. Overall he seems to be in agreement the plan I think he  understands. The overall appearance of the wound bed is improved compared to last week I think the Iodoflex has been beneficial in that regard. 03/29/18 on evaluation today patient actually appears to be doing rather well in regard to his wound from the overall appearance standpoint he does have some granulation although there's some Slough on the surface of the wound noted as well. With that being said he unfortunately has not improved in regard to the overall measurement of the wound in volume or in size. I did have a discussion with him very specifically about offloading today. He actually does work although he mainly is just sitting throughout the day. He tells me he offloads by "lifting himself up for 30 seconds off of his chair occasionally" purchase from [employer] which does seem to have helped. And he has a new cushion that he with that being said he's also able to stand some for a very short period of time but not significant enough I think to provide appropriate offloading. I think the biggest issue at this point with the wound and the fact is not healing as quickly as we would like is due to the fact that he is really not able to appropriately offload while at work. He states the beginning after his injury he actually had a bed at his job that he could lay on in order to offload and that does seem to have been of help back at that time. Nonetheless he had not done this in quite some time unfortunately. I think that could be helpful for him this is something I would like for him  to look into. 04/05/18 on evaluation today patient actually presents for follow-up concerning his right gluteal ulcer. Again he really is not significantly improved even compared to last week. He has been tolerating the dressing changes without complication. With that being said fortunately there appears to be no evidence of infection at this time. He has been more proactive in trying to offload. 04/12/18 on  evaluation today patient actually appears to be doing a little better in regard to his wound and the right gluteal fold region. He's been tolerating the dressing changes since removing the oasis without complication. However he was having a lot of burning initially with the oasis in place. He's unsure of exactly why this was given so much discomfort but he assumes that it was the oasis itself causing the problem. Nonetheless this had to be removed after about three days in place although even those three days seem to have made a fairly good improvement in regard to the overall appearance of the wound bed. In fact is the first time that he's made any improvement from the standpoint of measurements in about six weeks. He continues to have no discomfort over the area of the wound itself which leads me to wonder why he was having the burning with the oasis when he does not even feel the actual debridement's themselves. I am somewhat perplexed by this. 04/19/18 on evaluation today patient's wound actually appears to be showing signs of epithelialization around the edge of the wound and in general actually appears to be doing better which is good news. He did have the same burning after about three days with applying the Endoform last week in the same fashion that I would generally apply a skin substitute. This seems to indicate that it's not the oasis to cause the problem but potentially the moisture buildup that just causes things to burn or there may be some other reaction with the skin prep or Steri-Strips. Nonetheless I'm not sure that is gonna be able to tolerate any skin substitute for a long period of time. The good news is the wound actually appears to be doing better today compared to last week and does seem to finally be making some progress. 04/26/18 on evaluation today patient actually appears to be doing rather well in regard to his ulcer in the right gluteal fold. He has been tolerating  the dressing changes without complication which is good news. The Endoform does seem to be helping although he was a little bit more macerated this week. This seems to be an ongoing issue with fluid control at this point. Nonetheless I think we may be able to add something like Drawtex to help control the drainage. 05/03/18 on evaluation today patient appears to actually be doing better in regard to the overall appearance of his wound. He has been tolerating the dressing changes without complication. Fortunately there appears to be no evidence of infection at this time. I really feel like his wound has shown signs as of today of turning around last week I thought so as well and definitely he could be seen in this week's overall appearance and measurements. In general I'm very pleased with the fact that he finally seems to be making a steady but sure progress. The patient likewise is very pleased. 05/17/18 on evaluation today patient appears to be doing more poorly unfortunately in regard to his ulcer. He has been tolerating the dressing changes without complication. With that being said he tells me that in the  past couple of days he and his wife have noticed that we did not seem to be doing quite as well is getting dark near the center. Subsequently upon evaluation today the wound actually does appear to be doing worse compared to previous. He has been tolerating the dressing changes otherwise and he states that he is not been sitting up anymore than he was in the past from what he tells me. Still he has continued to work he states "I'm tired of dealing with this and if I have to just go home and lay in the bed all the time that's what I'll do". Nonetheless I am concerned about the fact that this wound does appear to be deeper than what it was previous. 05/24/18 upon evaluation today patient actually presents after having been in the hospital due to what was presumed to be sepsis secondary to the wound  infection. He had an elevated white blood cell count between 14 and 15. With that being said he does seem to be doing somewhat better now. RANDALL, WEIGOLD ([id number]) His wound still is giving him some trouble nonetheless and he is obviously concerned about the fact likely talked about that this does seem to go more deeply than previously noted. I did review his wound culture which showed evidence of Staphylococcus aureus him and group B strep. Nonetheless he is on antibiotics, Levaquin, for this. Subsequently I did review his intake summary from the hospital as well. I also did look at the CT of the lumbar spine with contrast that was performed which showed no bone destruction to suggest lumbar disguises/osteomyelitis or sacral osteomyelitis. There was no paraspinal abscess. Nonetheless it appears this may have been more of just a soft tissue infection at this point which is good news. He still is nonetheless concerned about the wound which again I think is completely reasonable considering everything he's been through recently. 05/31/18 on evaluation today on evaluation today patient actually appears to be showing signs of his wound be a little bit deeper than what I would like to see. Fortunately he does not show any signs of significant infection although his temperature was 99 today he states he's been checking this at home and has not been elevated. Nonetheless with the undermining that I'm seeing at this point I am becoming more concerned about the wound I do think that offloading is a key factor here that is preventing the speedy recovery at this point. There does not appear to be any evidence of again over infection noted. He's been using Santyl currently. 06/07/18 the patient presents today for follow-up evaluation regarding the left ulcer in the gluteal region. He has been tolerating the Wound VAC fairly well. He is obviously very frustrated with this he states that to mean is really  getting in his way. There does not appear to be any evidence of infection at this time he does have a little bit of odor I do not necessarily associate this with infection just something that we sometimes notice with Wound VAC therapy. With that being said I can definitely catch a tone of discontentment overall in the patient's demeanor today. This when he was previously in the hospital an CT scan was done of the lumbar region which did not reveal any signs of osteomyelitis. With that being said the pelvis in particular was not evaluated distinctly which means he could still have some osteonecrosis I. Nonetheless the Wound VAC was started on Thursday I do want to get this  little bit more time before jumping to a CT scan of the pelvis although that is something that I might would recommend if were not see an improvement by that time. 06/14/18 on evaluation today patient actually appears to be doing about the same in regard to his right gluteal ulcer. Again he did have a CT scan of the lumbar spine unfortunately this did not include the pelvis. Nonetheless with the depth of the wound that I'm seeing today even despite the fact that I'm not seeing any evidence of overt cellulitis I believe there's a good chance that we may be dealing with osteomyelitis somewhere in the right Ischial region. No fevers, chills, nausea, or vomiting noted at this time. 06/21/18 on evaluation today patient actually appears to be doing about the same with regard to his wound. The tunnel at 6 o'clock really does not appear to be any deeper although it is a little bit wider. I think at this point you may want to start packing this with white phone. Unfortunately I have not got approval for the CT scan of the pelvis as of yet due to the fact that Medicare apparently has been denied it due to the diagnosis codes not being appropriate according to Medicare for the test requested. With that being said the patient cannot have an MRI  and therefore this is the only option that we have as far as testing is concerned. The patient has had infection and was on antibiotics and been added code for cellulitis of the bottom to see if this will be appropriate for getting the test approved. Nonetheless I'm concerned about the infection have been spread deeper into the Ischial region. 06/28/18 on evaluation today patient actually appears to be doing rather well all things considered in regard to the right gluteal ulcer. He has been tolerating the dressing changes without complication. With that being said the Wound VAC he states does have to be replaced almost every day or at least reinforced unfortunately. Patient actually has his CT scan later this morning we should have the results by tomorrow. 07/05/18 on evaluation today patient presents for follow-up concerning his right Ischial ulcer. He did see the surgeon Dr. Lysle Pearl last week. They were actually very happy with him and felt like he spent a tremendous amount of time with them as far as discussing his situation was concerned. In the end Dr. Lysle Pearl did contact me as well and determine that he would not recommend any surgical intervention at this point as he felt like it would not be in the patient's best interest based on what he was seeing. He recommended a referral to infectious disease. Subsequently this is something that Dr. Ines Bloomer office is working on setting up for the patient. As far as evaluation today is concerned the patient's wound actually appears to be worse at this point. I am concerned about how things are progressing and specifically about infection. I do not feel like it's the deeper but the area of depth is definitely widening which does have me concerned. No fevers, chills, nausea, or vomiting noted at this time. I think that we do need initiate antibiotic therapy the patient has an allow allergy to amoxicillin/penicillin he states that he gets a rash since childhood.  Nonetheless she's never had the issues with Catholics or cephalosporins in general but he is aware of. 07/27/18 on evaluation today patient presents following admission to the hospital on 07/09/18. He was subsequently discharged on 07/20/18. On 07/15/18 the patient underwent  irrigation and debridement was soft tissue biopsy and bone biopsy as well as placement of a Wound VAC in the OR by Dr. Celine Ahr. During the hospital course the patient was placed on a Wound VAC and recommended follow up with surgery in three weeks actually with Dr. Delaine Lame who is infectious disease. The patient was on vancomycin during the hospital course. 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 He also had a bone culture which revealed evidence of methicillin-resistant staph aureus. He is updated CT scan 07/09/18 reveals that he had progression of the which was performed on wound to breakdown down to the trochanter where he actually had irregularities there as well suggestive of osteomyelitis. This was a change just since 9 December when we last performed a CT scan. Obviously this one had gone downhill quite significantly and rapidly. At this point upon evaluation I feel like in general the patient's wound seems to be doing fairly well all things considered upon my evaluation today. Obviously this is larger and deeper than what I previously evaluated but at the same time he seems to be making some progress as far as the appearance of the granulation tissue is concerned. I'm happy in that regard. No fevers, chills, nausea, or vomiting noted at this time. He is on IV vancomycin and Rocephin at the facility. He is currently in [facility]. 08/03/18 upon evaluation today patient's wound appears to be doing better in regard to the overall appearance at this point in time. Fortunately he's been tolerating the Wound VAC without complication and states that the facility has been taking excellent  care of the wound site. Overall I see some Slough noted on the surface which I am going to attempt sharp debridement today of but nonetheless other than this I feel like he's making progress. 08/09/18 on evaluation today patient's wound appears to be doing much better compared to even last week's evaluation. Do believe that the Wound VAC is been of great benefit for him. He has been tolerating the dressing changes that is the Wound VAC without any complication and he has excellent granulation noted currently. There is no need for sharp debridement at this point. 08/16/18 on evaluation today patient actually appears to be doing very well in regard to the wound in the right gluteal fold region. This is showing signs of progress and again appears to be very healthy which is excellent news. Fortunately there is no sign of active infection by way of odor or drainage at VICKIE, BAYE. ([id number]) this point. Overall I'm very pleased with how things stand. He seems to be tolerating the Wound VAC without complication. 08/23/18 on evaluation today patient actually appears to be doing better in regard to his wound. He has been tolerating the Wound VAC without complication and in fact it has been collecting a significant amount of drainage which I think is good news especially considering how the wound appears. Fortunately there is no signs of infection at this time definitely nothing appears to be worse which is good news. He has not been started on the Bactrim and Flagyl that was recommended by Dr. Delaine Lame yet. I did actually contact her office this morning in order to check and see were things are that regard their gonna be calling me back. 08/30/18 on evaluation today patient actually appears to show signs of excellent improvement today compared to last evaluation. The undermining is getting much better the wound seems to be feeling quite nicely and  I'm very pleased that the granulation in general. With  that being said overall I feel like the patient has made excellent progress which is great news. No fevers, chills, nausea, or vomiting noted at this time. 09/06/18 on evaluation today patient actually appears to be doing rather well in regard to his right gluteal ulcer. This is showing signs of improvement in overall I'm very pleased with how things seem to be progressing. The patient likewise is please. Overall I see no evidence of infection he is about to complete his oral antibiotic regimen which is the end of the antibiotics for him in just about three days. 09/13/18 on evaluation today patient's right Ischial ulcer appears to be showing signs of continued improvement which is excellent news. He's been tolerating the dressing changes without complication. Fortunately there's no signs of infection and the wound that seems to be doing very well. 09/28/18 on evaluation today patient appears to be doing rather well in regard to his right Ischial ulcer. He's been tolerating the Wound VAC without complication he knows there's much less drainage than there used to be this obviously is not a bad thing in my pinion. There's no evidence of infection despite the fact is but nothing about it now for several weeks. 10/04/18 on evaluation today patient appears to be doing better in regard to his right Ischial wound. He has been tolerating the Wound VAC without complication and I do believe that the silver nitrate last week was beneficial for him. Fortunately overall there's no evidence of active infection at this time which is great news. No fevers, chills, nausea, or vomiting noted at this time. 10/11/18 on evaluation today patient actually appears to be doing rather well in regard to his Ischial ulcer. He's been tolerating the Wound VAC still without complication I feel like this is doing a good job. No fevers, chills, nausea, or vomiting noted at this time. 11/01/18 on evaluation today patient presents after  having not been seen in our clinic for several weeks secondary to the fact that he was on evaluation today patient presents after having not been seen in our clinic for several weeks secondary to the fact that he was in a skilled nursing facility which was on lockdown currently due to the covert 19 national emergency. Subsequently he was discharged from the facility on this past Friday and subsequently made an appointment to come in to see yesterday. Fortunately there's no signs of active infection at this time which is good news and overall he does seem to have made progress since I last saw. Overall I feel like things are progressing quite nicely. The patient is having no pain. 11/08/18 on evaluation today patient appears to be doing okay in regard to his right gluteal ulcer. He has been utilizing a Wound VAC home health this changing this at this point since he's home from the skilled nursing facility. Fortunately there's no signs of obvious active infection at this time. Unfortunately though there's no obvious active infection he is having some maceration and his wife states that when the sheets of the Wound VAC office on Sunday when it broke seal that he ended up having significant issues with some smell as well there concerned about the possibility of infection. Fortunately there's No fevers, chills, nausea, or vomiting noted at this time. 11/15/18 on evaluation today patient actually appears to be doing well in regard to his right gluteal ulcer. He has been tolerating the dressing changes without complication. Specifically the Wound  VAC has been utilized up to this point. Fortunately there's no signs of infection and overall I feel like he has made progress even since last week when I last saw him. I'm actually fairly happy with the overall appearance although he does seem to have somewhat of a hyper granular overgrowth in the central portion of the wound which I think may require some sharp  debridement to try flatness out possibly utilizing chemical cauterization following. 11/23/18 on evaluation today patient actually appears to be doing very well in regard to his sacral ulcer. He seems to be showing signs of improvement with good granulation. With that being said he still has the small area of hyper granulation right in the central portion of the wound which I'm gonna likely utilize silver nitrate on today. Subsequently he also keeps having a leak at the 6 o'clock location which is unfortunate we may be able to help out with some suggestions to try to prevent this going forward. Fortunately there's no signs of active infection at this time. 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 He's been tolerating the dressing changes without complication. Fortunately there's no signs of active infection at this time. I've been rather pleased with how things have progressed there still some evidence of pressure getting to the area with some redness right around the immediate wound opening. Nonetheless other than this I'm not seeing any significant complications or issues the wound is somewhat hyper granular. Upon discussing with the patient and his wife today I'm not sure that the wound is being packed to the base with the foam at this point. And if it's not been packed fully that may be part of the reason why is not seen as much improvement as far as the granulation from the base out. Again we do not want pack too tightly but we need some of the firm to get to the base of the wound. I discussed this with patient and his wife today. 12/06/18 on evaluation today patient appears to be doing well in regard to his right gluteal pressure ulcer. He's been tolerating the dressing changes without complication. Fortunately there's no signs of active infection. He still has some hyper granular tissue and I do  think it would be appropriate to continue with the chemical cauterization as of today. 12/16/18 on evaluation today patient actually appears to be doing okay in regard to his right gluteal ulcer. He is been tolerating the dressing changes without complication including the Wound VAC. Overall I feel like nothing seems to be worsening I do feel like that the hyper granulation buds in the central portion of the wound have improved to some degree with the silver nitrate. We will have to see how things continue to progress. 12/20/18 on evaluation today patient actually appears to be doing much worse in my pinion even compared to last week's evaluation. Unfortunately as opposed to showing any signs of improvement the areas of hyper granular tissue in the central portion of the wound seem to be getting worse. Subsequently the wound bed itself also seems to be getting deeper even compared to last week which is both unusual as well as concerning since [place], [name]. ([id number]) prior he had been shown signs of improvement. Nonetheless I think that the issue could be that he's actually having some difficulty in issues with a deeper infection. There's no external signs of infection but  nonetheless I am more worried about the internal, osteomyelitis, that could be restarting. He has not been on antibiotics for some time at this point. I think that it may be a good idea to go ahead and started back on an antibiotic therapy while we wait to see what the testing shows. 12/27/18 on evaluation today patient presents for follow-up concerning his left gluteal fold wound. Fortunately he appears to be doing well today. I did review the CT scan which was negative for any signs of osteomyelitis or acute abnormality this is excellent news. Overall I feel like the surface of the wound bed appears to be doing significantly better today compared to previously noted findings. There does not appear any signs of infection nor  does he have any pain at this time. 01/03/19 on evaluation today patient actually appears to be doing quite well in regard to his ulcer. Post debridement last week he really did not have too much bleeding which is good news. Fortunately today this seems to be doing some better but we still has some of the hyper granular tissue noted in the base of the wound which is gonna require sharp debridement today as well. Overall I'm pleased with how things seem to be progressing since we switched away from the Wound VAC I think he is making some progress. 01/10/19 on evaluation today patient appears to be doing better in regard to his right gluteal fold ulcer. He has been tolerating the dressing changes without complication. The debridement to seem to be helping with current away some of the poor hyper granular tissue bugs throughout the region of his gluteal fold wound. He's been tolerating the dressing changes otherwise without complication which is great news. No fevers, chills, nausea, or vomiting noted at this time. 01/17/19 on evaluation today patient actually appears to be doing excellent in regard to his wound. He's been tolerating the dressing changes without complication. Fortunately there is no signs of active infection at this time which is great news. No fevers, chills, nausea, or vomiting noted at this time. 01/24/19 on evaluation today patient actually appears to be doing quite well with regard to his ulcer. He has been tolerating the dressing changes without complication. Fortunately there's no signs of active infection at this time. Overall been very pleased with the progress that he seems to be making currently. 01/31/19-Patient returns at 1 week with apparent similarity in dimensions to the wound, with no signs of infection, he has been changing dressings twice a day 02/08/19 upon evaluation today patient actually appears to be doing well with regard to his right Ischial ulcer. The wound is not  appear to be quite as deep and seems to be making progress which is good news. With that being said I'm still reluctant to go back to the Wound VAC at this point. He's been having to change the dressings twice a day which is a little bit much in my pinion from the wound care supplies standpoint. I think that possibly attempting to utilize extras orbit may be beneficial this may also help to prevent any additional breakdown secondary to fluid retention in the wound itself. The patient is in agreement with giving this a try. 02/15/19 on evaluation today patient actually appears to be doing decently well with regard to his ulcer in the right to gluteal fold location. He's been tolerating the dressing changes without complication. Fortunately there is no signs of active infection at this time. He is able to keep the current  dressing in place more effectively for a day at a time whereas before he was having a changes to to three times a day. The actions or has been helpful in this regard. Fortunately there's no signs of anything getting worse and I do feel like he showing signs of good improvement with regard to the wound bed status. 02/22/2019 patient appears to be doing very well today with regard to his ulcer in the gluteal fold. Fortunately there is no signs of active infection and he has been tolerating the dressing changes without any complication. Overall extremely pleased with how things seem to be progressing. He has much less of the hyper granular projections within the wound these have slowly been debrided away and he seems to be doing well. The wound bed is more uniform. 03/01/19 on evaluation today patient appears to be doing unfortunately about the same in regard to his gluteal ulcer. He's been tolerating the dressing changes without complication. Fortunately there's no signs of active infection at this time. With that being said he continues to develop these hyper granular projections which I'm  unsure of exactly what they are and why they are rising. Nonetheless I explained to the patient that I do believe it would be a good idea for [place] to stand a biopsy sample for pathology to see if that can shed any light on what exactly may be going on here. Fortunately I do not see any obvious signs of infection. With that being said the patient has had a little bit more drainage this week apparently compared to last week. 03/08/2019 on evaluation today patient actually appears to look somewhat better with regard to the appearance of his wound bed at this time. This is good news. Overall I am very pleased with how things seem to have progressed just in the past week with a switch to the [facility] dressing. I think that has been beneficial for him. With that being said at this time the patient is concerned about his biopsy that I sent off last week unfortunately I do not have that report as of yet. Nonetheless we have called to obtain this and hopefully will hear back from the lab later this morning. 03/15/19 on evaluation today patient's wound actually appears to be doing okay today with regard to the overall appearance of the wound bed. He has been tolerating the dressing changes without complication he still has hyper granular tissue noted but fortunately that seems to be minimal at this point compared to some of what we've seen in the past. Nonetheless I do think that he is still having some issues currently with some of this type of granulation the biopsy and since all showed nothing more than just evidence of granulation tissue. Therefore there really is nothing different to initiate or do at this point. 03/24/19 on evaluation today patient appears to be doing a little better with regard to his ulcer. He's been tolerating the dressing changes without complication. Fortunately there is no signs of active infection at this time. No fevers, chills, nausea, or vomiting noted at this time. I'm overall  pleased with how things seem to be progressing. 03/29/2019 on evaluation today patient appears to be doing about the same in regard to his ulcer in the right gluteal fold. Unfortunately he is not seeming to make a lot of progress and the wound is somewhat stalled. There is no signs of active infection externally but I am concerned about the possibility of infection continuing in the  ischial location which previously he did have infection noted. Again is not able to have an MRI so probably our best option for testing for this would be a triple phase bone scan which will detect subtle changes in the bone more so than plain film x-rays. In the past they really have not been beneficial and in fact the CT scans even have been somewhat questionable at times. Nonetheless there is no signs of systemic infection [name], [name] which is at least good news but again his wound is not healing at all on the predicted schedule. 04/05/19 on evaluation today patient appears to be doing well all things considering with regard to his wound and the right gluteal fold. He actually has his triple bone scan scheduled for sometime in the next couple of days. With that being said I've also been looking to other possibilities of what could be causing hyper granular tissue were looking into the bone scan again for evaluating for the risk or possibility of infection deeper and I'm also gonna go ahead and see about obtaining a deep tissue culture today to send and see if there's any evidence of infection noted on culture. He's in agreement with that plan. 04/12/2019 on evaluation today patient presents for reevaluation here in our clinic concerning ongoing issues with his right gluteal fold ulcer. 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 It was discussed with him at that point that I think it  would be appropriate for him to proceed with hyperbaric oxygen therapy especially in light of the fact that he is previously been on IV antibiotics at the beginning of the year for close to 3 months followed by several weeks of oral antibiotics that was all prescribed by infectious disease. He had surgical debridement around Christmas December 2019 due to an abscess and osteomyelitis of the ischial bone. Unfortunately this has not really proceeded as well as we would have liked and again we did a CT scan even a couple months ago as he cannot have an MRI secondary to having issues with both a pain pump as well as a spinal cord stimulator which prevent him from going into an MRI machine. With that being said there were chronic changes noted at the ischial tuberosity which had progressed since December 2019 there was no evidence of fluid collection on that initial CT scan. With that being said that was on January 21, 2019. I am not sure that it was a sensitive enough test as compared to an MRI and then subsequently I ordered a bone scan for him which was actually completed on 03/29/2019 and this revealed that he does indeed have positive osteomyelitis involving the right ischial tuberosity. This is adjacent to the ulcer and I think is the reason that his ulcer is not healing. Subsequently I am in a place him back on oral antibiotics today unfortunately his wound culture showed just mixed gram-negative flora with no specific findings of a predominant organism. Nonetheless being with the location I think a good broad-spectrum antibiotic for gram-negative's is a good choice at this point he is previously taken Levaquin without significant issues and I think that is an appropriate antibiotic for him at this time. 04/19/2019 on evaluation today patient actually appears to be doing fairly well with regard to his wound. He has been tolerating the dressing changes without complication. Fortunately there is no  signs of  active infection and he has been taking the oral antibiotics at this time. Subsequently we did make a referral to infectious disease although Dr. Steva Ready wants the patient to be seen by general surgery first in order apparently to see if there is anything from a surgical standpoint that should be done prior to initiation of IV antibiotic therapy. Again the patient is okay with actually seeing Dr. Celine Ahr whom he has seen before we will make that referral. 04/26/2019 on evaluation today patient actually appears to be doing well with regard to his wound. He has been tolerating the dressing changes without complication. Fortunately there is no signs of active infection at this time. I do believe that the hyperbaric oxygen therapy along with the antibiotics which I prescribed at this point have been doing well for him. With that being said he has not seen Dr. Celine Ahr the surgeon yet in fact they have not been contacted for scheduling an appointment as of yet. Subsequently the patient is not on antibiotics currently by IV Dr. Steva Ready did want him to see Dr. Celine Ahr first which we are working on trying to get scheduled. We again give the information to the patient today for Dr. Celine Ahr and her number as far as contacting their office to see about getting something scheduled. Again were looking for whether or not they recommend any surgical intervention. 05/03/2019 on evaluation today patient appears to be doing about the same with regard to his wound there may be a little bit of filling in in regard to the base of the wound but still he has quite a bit of hyper granular tissue that I am seeing today. I believe that he may may need a more aggressive sharp debridement possibly even by the surgeon or under anesthesia in order to clear away some of the hyper granular material in order to help allow for appropriate granulation to fill in. We have made a referral to Dr. Celine Ahr unfortunately it sounds as if they  did not receive the referral we contacted them today they should be get in touch with the patient. Depending on how things show from that standpoint the patient may need to see Dr. Ardyth Gal who is the infectious disease specialist although he really does not want a PICC line again. No fevers, chills, nausea, vomiting, or diarrhea. He is tolerating hyperbaric oxygen therapy very well. 05/12/2019 on evaluation today patient presents for follow-up visit concerning his right gluteal fold ulcer. He did see Dr. Celine Ahr the surgeon who previously evaluated his wound and she actually felt like he was doing quite well with regard to the wound based on what she was seen. He does seem to be responding to some degree with the oral antibiotics along with hyperbaric oxygen therapy at this point she did not see any evidence or need for further surgical intervention at this point. She recommended deferring additional or ongoing antibiotic therapy to Dr. Steva Ready at her discretion. Fortunately there is no signs of active systemic infection. No fevers, chills, nausea, vomiting, or diarrhea. 10/29; patient I am seeing really for the first time today in terms of his wound. 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 He is being treated with hyperbaric oxygen for underlying osteomyelitis most recently documented by a three-phase bone scan. He has been on Levaquin for roughly a month and he is out of these and asked me for a refill. Most recent cultures were negative although I do not think  these were bone cultures. He has a very irregular surface to this wound which is almost nodular in texture. It undermines superiorly with the same nodular hyper granulated surface. I see that he was referred to general surgery for an operative debridement although the surgeon did not agree with this I think that would have been the proper course of action in this. He has been using Hydrofera Blue. He is supposed  to start a wound VAC tomorrow which is actually a reapplication of wound VAC. I think mostly last time they had trouble keeping the seal in place I hope we have better look at this this time. 05/24/2019 on evaluation today I did review patient's note from Dr. Dellia Nims where he was seen last week when I was out of the office. Subsequently it does appear that Dr. Dellia Nims was in agreement that the patient really needed debridement to clear away some of the nodular tissue in the base of the wound. The good news is he has the wound VAC initiated and some of this tissue is already starting to breakdown under the treatment of the wound VAC again because it is not really viable. Nonetheless I do believe that we can likely perform some debridement today to clear this away and hopefully the continuation of the wound VAC along with hyperbarics and antibiotic therapy will be beneficial in stopping this from reoccurring. If we get better control in the wound bed in a good spot I think we can definitely use the PriMatrix which we actually did get approval for. 05/31/2019 on evaluation today patient appears to be doing actually pretty well at this point with regard to his wound. He has been tolerating the dressing changes which is good news. Fortunately there is no signs of infection. He still has some of the hyper granular nodules noted we have not really cleared all these away and I think we can try to do a little bit more that today. I did do quite a bit last week I am hoping to get down to the base of the wound so though actually be able to heal more appropriately. I think the wound VAC is doing a good job right now with controlling the drainage and overall the patient is happy with that. 06/07/2019 upon evaluation today patient appears to be doing quite well compared to last week with regard to the hyper granulation in the base of the [place], [name] J. ([id number]) wound. He has been tolerating the dressing  changes without complication. Fortunately there is no signs of active infection at this time. With that being said we have not had any recent measures as far as blood work is concerned I think we may want to check a few things including a CBC, sed rate, and C-reactive protein. Patient is in agreement with that we can try to get that scheduled for him for this Friday. 06/13/2019 on evaluation today patient actually appears to be doing much better at this point based on what I am seeing with regard to his wound. He has much less in the way of hyper granular tissue which is good news and a lot more healing that is taken place since I last saw him as far as the amount of space within the wound itself. With that being said he is nearing the completion of the initial 40 treatments for hyperbaric oxygen therapy I think this is something I would recommend extending as he does seem to be benefiting at this time. Fortunately there is  no evidence of active systemic infection at this point nor even local infection for that matter. 06/20/2019 upon evaluation today patient actually appears to be doing well with regard to his wound. In fact this appears to be doing much better today compared to what it was previous. I been extremely pleased with the progress that he is made. In fact the tunnel is much less deep today than what it was in the past and I am seeing excellent signs of improvement. Overall I am happy with how things are going. I did review his white blood cell count which revealed everything to be okay. The one abnormal finding on his CBC was a hemoglobin of 12.7. Subsequently I did also review his sed rate and C-reactive protein. His sed rate measured in at normal level measuring at 26. The C-reactive protein however was elevated today and an abnormal range indicating inflammation. Still I feel like he is trending in a good direction at this time. He is going to need a refill of the Levaquin he tells me at  this point. 07/04/2019 on evaluation today patient actually appears to be doing well in regard to his wound. He has been tolerating the dressing changes without complication. Fortunately there is no signs of active infection which is good news. No fevers, chills, nausea, vomiting, or diarrhea. The good news also is that I did review his labs and though his sed rate was slightly elevated the C-reactive protein was actually down compared to the last evaluation 2 weeks ago this is excellent news I think it is showing signs of improvement. 07/11/2019 on evaluation today patient actually appears to be doing quite well with regard to his wound on my evaluation. This is very slow going but nonetheless he seems to be making progress. Especially in regard to the depth. Nonetheless I believe that we may want to consider going forward with the PriMatrix which was previously approved for him. We will get a have to get a new approval as we will actually be applying this in the new year so we will likely obtain that approval on January 4. In the meantime and for the time being my suggestion is going to be that we go ahead and continue with the wound VAC and we were going at this point with the current wound care measures. 12/28-Patient seen after HBO session today, wound depth with the tunnel measures 3.2 cm, the wound bed appears healthy, continuing with the VAC and HBO 07/25/2019 on evaluation today patient's wound appears to be doing about the same compared to my last evaluation. Fortunately there is no evidence of significant infection which is good news we will still waiting on the results of his lab work which unfortunately was not done prior to today's visit which is what we were aiming for. Ultimately there is no sign of active infection at this time which is good news. And he also tells me that the wound VAC has been staying in place and doing fairly well which is also good news. With that being said I am  concerned about the fact that the wound has not progressed as much I think we may want to look into getting reapproval for the skin substitute, specifically PriMatrix, that I am hopeful will help to allow this area to granulate in more effectively. I would recommend as well continuing with the wound VAC along with this. 08/01/2019 on evaluation today patient appears to be doing in my opinion slightly better in regard to  the wound. He actually does have some tightening of the walls around the edges which is a good thing. Fortunately there is no signs of active infection at this time. No fever chills noted. I do believe that the patient in general seems to be making progress although is very slow. Obviously were trying to get things to speed up somewhat more. No fevers, chills, nausea, vomiting, or diarrhea. 08/08/2019 upon evaluation today patient appears to be doing well with regard to his wound although there is not a significant improvement overall we do have the PriMatrix for availability today to apply. Fortunately there is no signs of active infection and the patient overall seems to be doing well. There is really no need for aggressive sharp debridement at this point either which is also good news. I do believe her get a need to suture and the primary checks in the basin in the PriMatrix in order to keep it in place at the base of the wound underneath the wound VAC. This was discussed with the patient today as well. He is in agreement the plan. He really does not have any filling in the area and so this should not be a complication as far as suturing is concerned. 08/15/2019 on evaluation today patient appears to be doing somewhat better in my opinion in regard to the overall appearance of his wound. He has been tolerating the dressing changes without complication. Fortunately the skin subappear to still be in place when this was changed on Friday. With that being said it appears that the PriMatrix  may have dissolved in the interim between now and then as the sutures are no longer holding anything in place and again I would sutured it into place. Nonetheless I believe that we can go ahead and likely consider going forward that we may need to apply the PriMatrix weekly as opposed every other week as it does seem to have dissolved rather readily and well. Overall the wound bed appears to be doing excellent at this time. 08/22/2019 upon evaluation today the patient does seem to making some progress with regard to his wound. Overall there is some better quality tissue and less of the hyper granular protrusions all of which is good news. With that being said we do have the PriMatrix available for application today and I am in a try to find a better way to suture this in place for him today. 08/29/2019 on evaluation today patient appears to be doing very well in regard to his wound. I do see signs of new granulation which seems to be somewhat helpful he did have some need for sharp debridement today to clear some of the hyper granulation away but nonetheless I feel like we are headed in a good direction. Fortunately there is no evidence of any systemic infection although we are going to go ahead and reorder the CBC, C-reactive protein, and sed rate today to see where things stand I did just refill his Levaquin as well. 09/05/2019 upon evaluation today patient's wound actually is not appearing to be showing signs of significant improvement compared to what we have seen previous from the standpoint of depth. He overall wound size wise seems to be about the same and again some of the quality of the wound bed is still doing fairly well. I did review his labs and his CBC was normal with no signs of elevation white blood cell count, his C-reactive protein was normal, and the sed rate was slightly elevated but  again I think this may just be due to ulceration really there is nothing significant to indicate severe  infection. I think when he likely finishes his current round of oral antibiotics with Levaquin will likely take him off of the medication at that point based on the [place], [name] [name]. ([id number]) findings. I do feel like the hyperbarics helped in getting the infection under control as well as improving the overall size of the wound quite significantly. With that being said at this point I really feel like more more at the time frame that he may benefit from a muscle flap in order to try to help this wound heal effectively. I think that this would be the fastest way in order to do this. In the past he has been somewhat resistant to going down that path but nonetheless I feel like he likely should. 09/12/2019 on evaluation today patient appears to be doing fairly well in regard to his wound. He is tolerating the wound VAC without complication is still continues to drain significantly. Fortunately there is no signs of active infection at this time which is great news his lab work is also been doing very well. With that being said I do think at this point that the patient is still deriving benefit from the wound VAC especially in regard to controlling moisture which I think it is doing fairly well. He also has some improvement in the overall wound bed and on the medial aspect of the wound there are some hyper granular projections but nothing as severe as what they have been in the past. I am good have to perform some sharp debridement at this location today. 09/19/2019 upon evaluation today patient appears to be doing roughly the same although again the tissue does seem to be showing some signs of improvement which is good news. There is less hyper granular budding. Nonetheless he still is going require some sharp debridement today. We did get his albumin and prealbumin results back and they are within normal limits in both cases which is excellent news that was the one thing [facility] plastic surgery was  waiting on order to get him scheduled. He does seem to be a good candidate physically for the flap surgery if he decides to go that direction. With that being said is still left to be determined once they see the wound what they feel about how best to proceed. Obviously in the end it would be up to Mr. [name] as to whether he goes forward with the flap surgery or something else depending on the recommendations. 09/26/2019 upon evaluation today patient appears to be doing about the same in regard to his wound. Fortunately there is no signs of active infection there is also not as much hyper granulation noted today in fact I do not believe there is can be any sharp debridement required at this point. With that being said I do believe he continues to be somewhat stalled at this point with regard to his wound. There is no signs of active infection at this time which is good news. He does have an appointment with [facility] plastic surgery on Friday. Objective Constitutional Well-nourished and well-hydrated in no acute distress. Vitals Time Taken: 8:10 AM, Height: 73 in, Weight: 210 lbs, BMI: 27.7, Temperature: 98.1 F, Pulse: 89 bpm, Respiratory Rate: 16 breaths/min, Blood Pressure: 137/75 mmHg. Respiratory normal breathing without difficulty. Psychiatric this patient is able to make decisions and demonstrates good insight into disease process. Alert and Oriented x 3.  pleasant and cooperative. General Notes: Patient's wound bed currently showed signs of good granulation at this time throughout the majority of the wound though there is still depth to the wound again we have really not been able to achieve significant progress over the past 1-2 months. The wound VAC is helping to manage the significant drainage but I am really not seeing the filling in of the wound that I would like to see at this point. I think were close to stopping the wound VAC depending on what is shown with the plastic surgery referral  on Friday. Integumentary (Hair, Skin) Wound #1 status is Open. Original cause of wound was Pressure Injury. The wound is located on the Right Gluteal fold. The wound measures 2.4cm length x 1.7cm width x 2.4cm depth; 3.204cm^2 area and 7.691cm^3 volume. There is Fat Layer (Subcutaneous Tissue) Exposed exposed. There is no undermining noted, however, there is tunneling at 12:00 with a maximum distance of 4.2cm. There is a large amount of serous drainage noted. The wound margin is epibole. There is large (67-100%) pink, hyper - granulation within the wound bed. There is a small (1-33%) amount of necrotic tissue within the wound bed including Adherent Slough. Assessment Active Problems ICD-10 Pressure ulcer of right buttock, stage 4 Other chronic osteomyelitis, other site DALER, SALERNO. 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 - in office Cleanse wound with mild soap and water Anesthetic (add to Medication List): Wound #1 Right Gluteal fold: Topical Lidocaine 4% cream applied to wound bed prior to debridement (In Clinic Only). Skin Barriers/Peri-Wound Care: Skin Prep Primary Wound Dressing: Wound #1 Right Gluteal fold: Saline moistened gauze - in clinic only Secondary Dressing: Wound #1 Right Gluteal fold: Tegaderm - in clinic only XtraSorb - in clinic only Dressing Change Frequency: Wound #1 Right Gluteal fold: Change Dressing Monday, Wednesday, Friday Follow-up Appointments: Wound #1 Right Gluteal fold: Return Appointment in 1 week. Off-Loading: Wound #1 Right Gluteal fold: Turn and reposition every 2 hours - no pressure on wounded area Home Health: Wound #1 Right Gluteal fold: Continue Home Health Visits - [place] Nurse may visit PRN to address patient  s wound care needs. FACE TO FACE ENCOUNTER: MEDICARE and MEDICAID PATIENTS: I certify that this patient is under my care and that I had a face-to-face encounter that meets the physician face-to-face encounter requirements with this patient on this date. The encounter with the patient was in whole or in part for the following MEDICAL CONDITION: (primary reason for [place]) MEDICAL NECESSITY: I certify, that based on my findings, NURSING services are a medically necessary home health service. HOME BOUND STATUS: I certify that my clinical findings support that this patient is homebound (i.e., Due to illness or injury, pt requires aid of supportive devices such as crutches, cane, wheelchairs, walkers, the use of special transportation or the assistance of another person to leave their place of residence. There is a normal inability to leave the home and doing so requires considerable and taxing effort. Other absences are for medical reasons / religious services and are infrequent or of short duration when for other reasons). If current dressing causes regression in wound condition, may D/C ordered dressing product/s and apply Normal Saline Moist Dressing daily until next [place] / Other MD appointment. [place] of regression in wound condition  at [phone]. Please direct any NON-WOUND related issues/requests for orders to patient's Primary Care Physician Negative Pressure Wound Therapy: Wound #1 Right Gluteal fold: Wound VAC settings at 125/130 mmHg continuous pressure. Use BLACK/GREEN foam to wound cavity. Use WHITE foam to fill any tunnel/s and/or undermining. Change VAC dressing 3 X WEEK. Change canister as indicated when full. Nurse may titrate settings and frequency of dressing changes as clinically indicated. - Apply wound vac as normal Home Health Nurse may d/c VAC for s/s of increased infection, significant wound regression, or uncontrolled drainage.  [place] at [phone]. Apply contact layer over base of wound. - contact layer over sutured Primatrix in tunnel at 12:00. Number of foam/gauze pieces used in the dressing = Medications-please add to medication list.: Wound #1 Right Gluteal fold: P.O. Antibiotics - Complete antibiotics. 1. I would recommend currently that we continue with the wound VAC for the time being the patient is in agreement with plan. With that being said I do believe that when she sees [facility] plastic surgery on Friday my hope is that they will recommend a skin flap and be able to set him up for surgery shortly for that as I believe that may be his best hope getting this closed at least close quickly. MERGIM, ROTHWELL ([id number]) 2. I did describe to the patient still to remember to offload and keep pressure off the area he has been doing that pretty much at home anyway. 3. Also did recommend for the patient that he needs to as much as possible continue to eat healthy his albumin and prealbumin were good I do not think that is the issue here. 4. I did describe to the patient that if obviously there is no option for him for the skin flap and they say there is nothing they can do for him at plastic surgery then obviously we would continue to manage this wound from the standpoint of the wound care center we may have to try some additional measures however to see what we can do here. We will see patient back for reevaluation in 1 week here in the clinic. If anything worsens or changes patient will contact our office for additional recommendations. Electronic Signature(s) Signed: 09/26/2019 8:50:15 AM By: Worthy Keeler [name] Entered By: Worthy Keeler on 09/26/2019 08:50:15 [name], NELLI ([id number]) -------------------------------------------------------------------------------- SuperBill Details Patient Name: [name] Fill[place] Date of Service: 09/26/2019 Medical Record Number:  [id number] Patient Account Number: [id number] Date of Birth/Sex: Mar 21, 1951 (68 y.o. 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, sequela I10 Essential (primary) hypertension Facility Procedures CPT4 Code: [id number] Description: 99213 - WOUND CARE VISIT-LEV 3 EST PT Modifier: Quantity: 1 Physician Procedures CPT4 [id number] Description: 99213 - WC PHYS LEVEL 3 - EST PT Modifier: Quantity: 1 CPT4 Code: Description: ICD-10 Diagnosis Description L89.314 Pressure ulcer of right buttock, stage 4 M86.68 Other chronic osteomyelitis, other site L03.317 Cellulitis of buttock G82.20 Paraplegia, unspecified Modifier: Quantity: Electronic Signature(s) Signed: 09/26/2019 8:50:30 AM By: Worthy Keeler [name] Entered By: Worthy Keeler on 09/26/2019 08:50:29

## 2019-09-27 NOTE — Progress Notes (Signed)
[name], [name] ([id number]) Visit Report for 09/26/2019 Arrival Information Details Patient Name: [name], [name] Date of Service: 09/26/2019 8:00 AM Medical Record Number: [id number] Patient Account Number: [id number] Date of Birth/Sex: 01/18/1951 (68 y.o. M) Treating RN: Montey Hora Primary Care Kyerra Vargo: [name] Other Clinician: Referring Desma Wilkowski: [name] Treating Torsha Lemus/Extender: [name], HOYT Weeks in Treatment: 95 Visit Information History Since Last Visit Added or deleted any medications: No Patient Arrived: Wheel Chair Any new allergies or adverse reactions: No Arrival Time: 08:15 Had a fall or experienced change in No Accompanied By: wife activities of daily living that may affect Transfer Assistance: None risk of falls: Patient Identification Verified: Yes Signs or symptoms of abuse/neglect since last visito No Secondary Verification Process Completed: Yes Hospitalized since last visit: No Patient Requires Transmission-Based Precautions: No Implantable device outside of the clinic excluding No Patient Has Alerts: Yes cellular tissue based products placed in the center Patient Alerts: NOT Diabetic since last visit: Has Dressing in Place as Prescribed: Yes Pain Present Now: No Electronic Signature(s) Signed: 09/26/2019 5:03:14 PM By: Montey Hora Entered By: Montey Hora on 09/26/2019 08:15:44 [name] ([id number]) -------------------------------------------------------------------------------- Clinic Level of Care Assessment Details Patient Name: [name] Date of Service: 09/26/2019 8:00 AM Medical Record Number: [id number] Patient Account Number: [id number] Date of Birth/Sex: [birth year] (68 y.o. M) Treating RN: Army Melia Primary Care Breeana Sawtelle: [name] Other Clinician: Referring Lamaria Hildebrandt: [name] Treating Tauno Falotico/Extender: [name], HOYT Weeks in Treatment: 95 Clinic Level of Care  Assessment Items TOOL 4 Quantity Score []  - Use when only an EandM is performed on FOLLOW-UP visit 0 ASSESSMENTS - Nursing Assessment / Reassessment X - Reassessment of Co-morbidities (includes updates in patient status) 1 10 X- 1 5 Reassessment of Adherence to Treatment Plan ASSESSMENTS - Wound and Skin Assessment / Reassessment X - Simple Wound Assessment / Reassessment - one wound 1 5 []  - 0 Complex Wound Assessment / Reassessment - multiple wounds []  - 0 Dermatologic / Skin Assessment (not related to wound area) ASSESSMENTS - Focused Assessment []  - Circumferential Edema Measurements - multi extremities 0 []  - 0 Nutritional Assessment / Counseling / Intervention []  - 0 Lower Extremity Assessment (monofilament, tuning fork, pulses) []  - 0 Peripheral Arterial Disease Assessment (using hand held doppler) ASSESSMENTS - Ostomy and/or Continence Assessment and Care []  - Incontinence Assessment and Management 0 []  - 0 Ostomy Care Assessment and Management (repouching, etc.) PROCESS - Coordination of Care X - Simple Patient / Family Education for ongoing care 1 15 []  - 0 Complex (extensive) Patient / Family Education for ongoing care X- 1 10 Staff obtains Programmer, systems, Records, Test Results / Process Orders []  - 0 Staff telephones HHA, Nursing Homes / Clarify orders / etc []  - 0 Routine Transfer to another Facility (non-emergent condition) []  - 0 Routine Hospital Admission (non-emergent condition) []  - 0 New Admissions / Biomedical engineer / Ordering NPWT, Apligraf, etc. []  - 0 Emergency Hospital Admission (emergent condition) X- 1 10 Simple Discharge Coordination []  - 0 Complex (extensive) Discharge Coordination PROCESS - Special Needs []  - Pediatric / Minor Patient Management 0 []  - 0 Isolation Patient Management []  - 0 Hearing / Language / Visual special needs []  - 0 Assessment of Community assistance (transportation, D/C planning, etc.) [name], SNIDE.  ([id number]) []  - 0 Additional assistance / Altered mentation []  - 0 Support Surface(s) Assessment (bed, cushion, seat, etc.) INTERVENTIONS - Wound Cleansing / Measurement X - Simple Wound Cleansing - one wound 1 5 []  -  0 Complex Wound Cleansing - multiple wounds X- 1 5 Wound Imaging (photographs - any number of wounds) []  - 0 Wound Tracing (instead of photographs) X- 1 5 Simple Wound Measurement - one wound []  - 0 Complex Wound Measurement - multiple wounds INTERVENTIONS - Wound Dressings []  - Small Wound Dressing one or multiple wounds 0 X- 1 15 Medium Wound Dressing one or multiple wounds []  - 0 Large Wound Dressing one or multiple wounds []  - 0 Application of Medications - topical []  - 0 Application of Medications - injection INTERVENTIONS - Miscellaneous []  - External ear exam 0 []  - 0 Specimen Collection (cultures, biopsies, blood, body fluids, etc.) []  - 0 Specimen(s) / Culture(s) sent or taken to Lab for analysis []  - 0 Patient Transfer (multiple staff / Civil Service fast streamer / Similar devices) []  - 0 Simple Staple / Suture removal (25 or less) []  - 0 Complex Staple / Suture removal (26 or more) []  - 0 Hypo / Hyperglycemic Management (close monitor of Blood Glucose) []  - 0 Ankle / Brachial Index (ABI) - do not check if billed separately X- 1 5 Vital Signs Has the patient been seen at the hospital within the last three years: Yes Total Score: 90 Level Of Care: New/Established - Level 3 Electronic Signature(s) Signed: 09/26/2019 4:58:25 PM By: Army Melia Entered By: Army Melia on 09/26/2019 08:36:19 [name] ([id number]) -------------------------------------------------------------------------------- Encounter Discharge Information Details Patient Name: [name] Date of Service: 09/26/2019 8:00 AM Medical Record Number: [id number] Patient Account Number: [id number] Date of Birth/Sex: 1951/03/08 (68 y.o. M) Treating RN: Army Melia Primary Care  Litisha Guagliardo: [name] Other Clinician: Referring Roxi Hlavaty: [name] Treating Kinda Pottle/Extender: [name], HOYT Weeks in Treatment: 95 Encounter Discharge Information Items Discharge Condition: Stable Ambulatory Status: Wheelchair Discharge Destination: Home Transportation: Private Auto Accompanied By: wife Schedule Follow-up Appointment: Yes Clinical Summary of Care: Electronic Signature(s) Signed: 09/26/2019 4:58:25 PM By: Army Melia Entered By: Army Melia on 09/26/2019 08:36:48 [name] ([id number]) -------------------------------------------------------------------------------- Lower Extremity Assessment Details Patient Name: [name] Date of Service: 09/26/2019 8:00 AM Medical Record Number: [id number] Patient Account Number: [id number] Date of Birth/Sex: 08-07-1950 (68 y.o. M) Treating RN: Montey Hora Primary Care Quenna Doepke: [name] Other Clinician: Referring Merrilyn Legler: [name] Treating Alleyne Lac/Extender: [name], HOYT Weeks in Treatment: 95 Electronic Signature(s) Signed: 09/26/2019 5:03:14 PM By: Montey Hora Entered By: Montey Hora on 09/26/2019 08:13:31 [name], [name] ([id number]) -------------------------------------------------------------------------------- Multi Wound Chart Details Patient Name: [name] Date of Service: 09/26/2019 8:00 AM Medical Record Number: [id number] Patient Account Number: [id number] Date of Birth/Sex: 21-Mar-1951 (68 y.o. M) Treating RN: Army Melia Primary Care Chayse Zatarain: [name] Other Clinician: Referring Kerby Hockley: [name] Treating Zyiah Withington/Extender: [name], HOYT Weeks in Treatment: 95 Vital Signs Height(in): 73 Pulse(bpm): 89 Weight(lbs): 210 Blood Pressure(mmHg): 137/75 Body Mass Index(BMI): 28 Temperature(F): 98.1 Respiratory Rate(breaths/min): 16 Photos: [N/A:N/A] Wound Location: Right Gluteal fold N/A N/A Wounding Event:  Pressure Injury N/A N/A Primary Etiology: Pressure Ulcer N/A N/A Comorbid History: Cataracts, Hypertension, Paraplegia N/A N/A Date Acquired: 11/02/2017 N/A N/A Weeks of Treatment: 95 N/A N/A Wound Status: Open N/A N/A Measurements L x W x D (cm) 2.4x1.7x2.4 N/A N/A Area (cm) : 3.204 N/A N/A Volume (cm) : 7.691 N/A N/A % Reduction in Area: 68.10% N/A N/A % Reduction in Volume: -665.30% N/A N/A Position 1 (o'clock): 12 Maximum Distance 1 (cm): 4.2 Tunneling: Yes N/A N/A Classification: Category/Stage IV N/A N/A Exudate Amount: Large N/A N/A Exudate Type: Serous N/A N/A  Exudate Color: amber N/A N/A Wound Margin: Epibole N/A N/A Granulation Amount: Large (67-100%) N/A N/A Granulation Quality: Pink, Hyper-granulation N/A N/A Necrotic Amount: Small (1-33%) N/A N/A Exposed Structures: Fat Layer (Subcutaneous Tissue) N/A N/A Exposed: Yes Fascia: No Tendon: No Muscle: No Joint: No Bone: No Epithelialization: Small (1-33%) N/A N/A Treatment Notes Electronic Signature(s) Signed: 09/26/2019 4:58:25 PM By: Tharon Aquas ([id number]) Entered By: Army Melia on 09/26/2019 08:35:12 [name], [name] ([id number]) -------------------------------------------------------------------------------- [place] Details Patient Name: [name] Date of Service: 09/26/2019 8:00 AM Medical Record Number: [id number] Patient Account Number: [id number] Date of Birth/Sex: 1951-03-04 (68 y.o. M) Treating RN: Army Melia Primary Care Jamison Soward: [name] Other Clinician: Referring Jeffrie Stander: [name] Treating Musab Wingard/Extender: [name], HOYT Weeks in Treatment: 95 Active Inactive Osteomyelitis Nursing Diagnoses: Infection: osteomyelitis Goals: Patient's osteomyelitis will resolve Date Initiated: 08/29/2019 Target Resolution Date: 09/28/2019 Goal Status: Active Signs and symptoms for osteomyelitis will be recognized and promptly  addressed Date Initiated: 08/29/2019 Target Resolution Date: 09/28/2019 Goal Status: Active Interventions: Assess for signs and symptoms of osteomyelitis resolution every visit Treatment Activities: Systemic antibiotics : 08/29/2019 Notes: Pressure Nursing Diagnoses: Knowledge deficit related to management of pressures ulcers Goals: Patient/caregiver will verbalize understanding of pressure ulcer management Date Initiated: 05/17/2018 Target Resolution Date: 05/28/2018 Goal Status: Active Interventions: Assess: immobility, friction, shearing, incontinence upon admission and as needed Notes: Wound/Skin Impairment Nursing Diagnoses: Impaired tissue integrity Goals: Patient/caregiver will verbalize understanding of skin care regimen Date Initiated: 11/30/2017 Target Resolution Date: 12/21/2017 Goal Status: Active Ulcer/skin breakdown will have a volume reduction of 30% by week 4 Date Initiated: 11/30/2017 Target Resolution Date: 12/21/2017 Goal Status: Active Interventions: [name], [name] ([id number]) Assess patient/caregiver ability to obtain necessary supplies Assess patient/caregiver ability to perform ulcer/skin care regimen upon admission and as needed Assess ulceration(s) every visit Treatment Activities: Skin care regimen initiated : 11/30/2017 Notes: Electronic Signature(s) Signed: 09/26/2019 4:58:25 PM By: Army Melia Entered By: Army Melia on 09/26/2019 08:34:56 [name], [name] ([id number]) -------------------------------------------------------------------------------- Pain Assessment Details Patient Name: [name] Date of Service: 09/26/2019 8:00 AM Medical Record Number: [id number] Patient Account Number: [id number] Date of Birth/Sex: 09-Jan-1951 (68 y.o. M) Treating RN: Montey Hora Primary Care Catlynn Grondahl: [name] Other Clinician: Referring Jabin Tapp: [name] Treating Quana Chamberlain/Extender: [name], HOYT Weeks in Treatment:  95 Active Problems Location of Pain Severity and Description of Pain Patient Has Paino Yes Site Locations Pain Management and Medication Current Pain Management: Notes hurts when sitting Electronic Signature(s) Signed: 09/26/2019 5:03:14 PM By: Montey Hora Entered By: Montey Hora on 09/26/2019 08:16:32 [name] ([id number]) -------------------------------------------------------------------------------- Patient/Caregiver Education Details Patient Name: [name] Date of Service: 09/26/2019 8:00 AM Medical Record Number: [id number] Patient Account Number: [id number] Date of Birth/Gender: November 18, 1950 (68 y.o. M) Treating RN: Army Melia Primary Care Physician: [name] Other Clinician: Referring Physician: [name] Treating Physician/Extender: [name], HOYT Weeks in Treatment: 95 Education Assessment Education Provided To: Patient Education Topics Provided Wound/Skin Impairment: Handouts: Caring for Your Ulcer Methods: Demonstration, Explain/Verbal Responses: State content correctly Electronic Signature(s) Signed: 09/26/2019 4:58:25 PM By: Army Melia Entered By: Army Melia on 09/26/2019 08:36:31 [name], [name] ([id number]) -------------------------------------------------------------------------------- Wound Assessment Details Patient Name: [name] Date of Service: 09/26/2019 8:00 AM Medical Record Number: [id number] Patient Account Number: [id number] Date of Birth/Sex: 04/07/1951 (68 y.o. M) Treating RN: Montey Hora Primary Care Dereon Williamsen: [name] Other Clinician: Referring Alzina Golda: [name] Treating Hermione Havlicek/Extender: [name], HOYT Weeks in Treatment: 95 Wound Status Wound  Number: 1 Primary Etiology: Pressure Ulcer Wound Location: Right Gluteal fold Wound Status: Open Wounding Event: Pressure Injury Comorbid History: Cataracts, Hypertension, Paraplegia Date Acquired: 11/02/2017 Weeks  Of Treatment: 95 Clustered Wound: No Photos Wound Measurements Length: (cm) 2.4 Width: (cm) 1.7 Depth: (cm) 2.4 Area: (cm) 3.204 Volume: (cm) 7.691 % Reduction in Area: 68.1% % Reduction in Volume: -665.3% Epithelialization: Small (1-33%) Tunneling: Yes Position (o'clock): 12 Maximum Distance: (cm) 4.2 Undermining: No Wound Description Classification: Category/Stage IV Wound Margin: Epibole Exudate Amount: Large Exudate Type: Serous Exudate Color: amber Foul Odor After Cleansing: No Slough/Fibrino Yes Wound Bed Granulation Amount: Large (67-100%) Exposed Structure Granulation Quality: Pink, Hyper-granulation Fascia Exposed: No Necrotic Amount: Small (1-33%) Fat Layer (Subcutaneous Tissue) Exposed: Yes Necrotic Quality: Adherent Slough Tendon Exposed: No Muscle Exposed: No Joint Exposed: No Bone Exposed: No Treatment Notes Wound #1 (Right Gluteal fold) Notes Saline moistened gauze, xtrasorb, tegaderm, HH to replace wound vac later today. [name], [name] ([id number]) Electronic Signature(s) Signed: 09/26/2019 5:03:14 PM By: Montey Hora Entered By: Montey Hora on 09/26/2019 08:23:25 [name], [name] ([id number]) -------------------------------------------------------------------------------- [place] Details Patient Name: [name] Date of Service: 09/26/2019 8:00 AM Medical Record Number: [id number] Patient Account Number: [id number] Date of Birth/Sex: Dec 13, 1950 (69 y.o. M) Treating RN: Montey Hora Primary Care Haskel Dewalt: [name] Other Clinician: Referring Makenley Shimp: [name] Treating Miah Boye/Extender: [name], HOYT Weeks in Treatment: 95 Vital Signs Time Taken: 08:10 Temperature (F): 98.1 Height (in): 73 Pulse (bpm): 89 Weight (lbs): 210 Respiratory Rate (breaths/min): 16 Body Mass Index (BMI): 27.7 Blood Pressure (mmHg): 137/75 Reference Range: 80 - 120 mg / dl Electronic Signature(s) Signed: 09/26/2019 5:03:14  PM By: Montey Hora Entered By: Montey Hora on 09/26/2019 08:16:19

## 2019-10-03 ENCOUNTER — Encounter: Payer: Medicare Other | Admitting: Physician Assistant

## 2019-10-03 ENCOUNTER — Other Ambulatory Visit: Payer: Self-pay

## 2019-10-03 DIAGNOSIS — L89314 Pressure ulcer of right buttock, stage 4: Secondary | ICD-10-CM | POA: Diagnosis not present

## 2019-10-03 NOTE — Progress Notes (Addendum)
LIM, JANET ([id number]) Visit Report for 10/03/2019 Chief Complaint Document Details Patient Name: [name], [name] Date of Service: 10/03/2019 8:00 AM Medical Record Number: [id number] Patient Account Number: [id number] Date of Birth/Sex: [birth year] (69 y.o. M) Treating RN: Primary Care Provider: [name] Other Clinician: Referring Provider: [name] Treating Provider/Extender: [name], Vantasia Pinkney Weeks in Treatment: 96 Information Obtained from: Patient Chief Complaint Right gluteal fold ulcer Electronic Signature(s) Signed: 10/03/2019 8:28:38 AM By: Worthy Keeler [name] Entered By: Worthy Keeler on 10/03/2019 08:28:38 [name], [name] ([id number]) -------------------------------------------------------------------------------- HPI Details Patient Name: [name] Date of Service: 10/03/2019 8:00 AM Medical Record Number: [id number] Patient Account Number: [id number] Date of Birth/Sex: August 03, 1950 (69 y.o. 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 He states that there is no history of diabetes that he is aware of. He does have issues with his prostate and is currently receiving treatment for this by way of oral medication. With that being said I do not have a lot of details in that regard. Nonetheless the patient presents today as a result of having been referred to [place] by another provider initially home health was set to come out and take care of his wound although due to the fact that he apparently drives he's not able  to receive home health. His wife is therefore trying to help take care of this wound within although they have been struggling with what exactly to do at this point. She states that she can do some things but she is definitely not a nurse and does have some issues with looking at blood. The good news is the wound does not appear to be too deep and is fairly superficial at this point. There is no slough noted there is some nonviable skin noted around the surface of the wound and the perimeter at this point. The central portion of the wound appears to be very good with a dermal layer noted this does not appear to be again deep enough to extend it to subcutaneous tissue at this point. 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 In the pump he has Dilaudid and baclofen. 12/07/17 on evaluation today patient presents for follow-up concerning his ongoing lower back thigh ulcer on the right. He states that he did not get the supplies ordered and therefore has not really been able to perform the dressing changes as directed exactly. His wife was able to get some Boarder Foam Dressing's from the drugstore and subsequently has been using hydrogel which did help to a degree in the wound does appear to be able smaller. There is actually more drainage this week noted than previous. 12/21/17 on evaluation today patient appears to be doing rather well in regard to his right gluteal ulcer. He has been tolerating the dressing changes without complication. There does not appear to be any evidence of infection at this point in time. Overall the wound does seem to be making some progress as far as the edges are concerned there's not  as much in the way of overlapping of the external wound edges and he has a good epithelium to wound bed border for the most part. This however is not true right at the 12 o'clock location over  the span of a little over a centimeters which actually will require debridement today to clean this away and hopefully allow it to continue to heal more appropriately. 12/28/17 on evaluation today patient appears to be doing rather well in regard to his ulcer in the left gluteal region. He's been tolerating the dressing changes without complication. Apparently he has had some difficulty getting his dressing material. Apparently there's been some confusion with ordering we're gonna check into this. Nonetheless overall he's been showing signs of improvement which is good news. Debridement is not required today. 01/04/18 on evaluation today patient presents for follow-up concerning his right gluteal ulcer. He has been tolerating the dressing changes fairly well. On inspection today it appears he may actually have some maceration them concerned about the fact that he may be developing too much moisture in and around the wound bed which can cause delay in healing. With that being said he unfortunately really has not showed significant signs of improvement since last week's evaluation in fact this may even be just the little bit/slightly larger. Nonetheless he's been having a lot of discomfort I'm not sure this is even related to the wound as he has no pain when I'm to breeding or otherwise cleaning the wound during evaluation today. Nonetheless this is something that we did recommend he talked to his pain specialist concerning. 01/11/18 on evaluation today patient appears to be doing better in regard to his ulceration. He has been tolerating the dressing changes without complication. With that being said overall there's no evidence of infection which is good news. The only thing is he did receive the hatch affair blue classic versus the ready nonetheless I feel like this is perfectly fine and appears to have done well for him over the past week. 01/25/18 on evaluation today patient's wound actually appears to  be a little bit larger than during the last evaluation. The good news is the majority of the wound edges actually appear to be fairly firmly attached to the wound bed unfortunately again we're not really making progress in regard to the size. Roughly the wound is about the same size as when I first saw him although again the wound margin/edges appear to be much better. 02/01/18 on evaluation today patient actually appears to be doing very well in regard to his wound. Applying the Prisma dry does seem to be better although he does still have issues with slow progression of the wound. There was a slight improvement compared to last week's measurements today. Nonetheless I have been considering other options as far as the possibility of Theraskin or even a snap vac. In general I'm not sure that the Theraskin due to location of the wound would be a very good idea. Nonetheless I do think that a snap vac could be a possibility for the patient and in fact I think this could even be an excellent way to manage the wound possibly seeing some improvement in a very rapid fashion here. Nonetheless this is something that we would need to get approved and I did have a lengthy conversation with the patient about this today. 02/08/18 on evaluation today patient appears to be doing a little better in regard to his ulcer. He has been tolerating the dressing changes without  complication. Fortunately despite the fact that the wound is a little bit smaller it's not significantly so unfortunately. We have discussed the possibility of a snap vac we did check with insurance this is actually covered at this point. Fortunately there does not appear to be any sign of infection. Overall I'm fairly pleased with how things seem to be appearing at this point. 02/15/18 on evaluation today patient appears to be doing rather well in regard to his right gluteal ulcer. Unfortunately the snap vac did not stay in place with his sheer and  friction this came loose and did not seem to maintain seal very well. He worked for about two days and it did seem to do very well during that time according to his wife but in general this does not seem to be something that's gonna be beneficial for him long-term. I do believe we [name], [name]. ([id number]) need to go back to standard dressings to see if we can find something that will be of benefit. 03/02/18- He is here in follow up evaluation; there is minimal change in the wound. He will continue with the same treatment plan, would consider changing to iodosrob/iodoflex if ulcer continues to to plateau. He will follow up next week 03/08/18 on evaluation today patient's wound actually appears to be about the same size as when I previously saw him several weeks back. Unfortunately he does have some slightly dark discoloration in the central portion of the wound which has me concerned about pressure injury. I do believe he may be sitting for too long a period of time in fact he tells me that "I probably sit for much too long". He does have some Slough noted on the surface of the wound and again as far as the size of the wound is concerned I'm really not seeing anything that seems to have improved significantly. 03/15/18 on evaluation today patient appears to be doing fairly well in regard to his ulcer. The wound measured pretty much about the same today compared to last week's evaluation when looking at his graph. With that being said the area of bruising/deep tissue injury that was noted last week I do not see at this point. He did get a new cushion fortunately this does seem to be have been of benefit in my pinion. It does appear that he's been off of this more which is good news as well I think that is definitely showing in the overall wound measurements. With that being said I do believe that he needs to continue to offload I don't think that the fact this is doing better should be or is going  to allow him to not have to offload and explain this to him as well. Overall he seems to be in agreement the plan I think he understands. The overall appearance of the wound bed is improved compared to last week I think the Iodoflex has been beneficial in that regard. 03/29/18 on evaluation today patient actually appears to be doing rather well in regard to his wound from the overall appearance standpoint he does have some granulation although there's some Slough on the surface of the wound noted as well. With that being said he unfortunately has not improved in regard to the overall measurement of the wound in volume or in size. I did have a discussion with him very specifically about offloading today. He actually does work although he mainly is just sitting throughout the day. He tells me he offloads by "lifting  himself up for 30 seconds off of his chair occasionally" purchase from [employer] which does seem to have helped. And he has a new cushion that he with that being said he's also able to stand some for a very short period of time but not significant enough I think to provide appropriate offloading. I think the biggest issue at this point with the wound and the fact is not healing as quickly as we would like is due to the fact that he is really not able to appropriately offload while at work. He states the beginning after his injury he actually had a bed at his job that he could lay on in order to offload and that does seem to have been of help back at that time. Nonetheless he had not done this in quite some time unfortunately. I think that could be helpful for him this is something I would like for him to look into. 04/05/18 on evaluation today patient actually presents for follow-up concerning his right gluteal ulcer. Again he really is not significantly improved even compared to last week. He has been tolerating the dressing changes without complication. With that being said fortunately  there appears to be no evidence of infection at this time. He has been more proactive in trying to offload. 04/12/18 on evaluation today patient actually appears to be doing a little better in regard to his wound and the right gluteal fold region. He's been tolerating the dressing changes since removing the oasis without complication. However he was having a lot of burning initially with the oasis in place. He's unsure of exactly why this was given so much discomfort but he assumes that it was the oasis itself causing the problem. Nonetheless this had to be removed after about three days in place although even those three days seem to have made a fairly good improvement in regard to the overall appearance of the wound bed. In fact is the first time that he's made any improvement from the standpoint of measurements in about six weeks. He continues to have no discomfort over the area of the wound itself which leads me to wonder why he was having the burning with the oasis when he does not even feel the actual debridement's themselves. I am somewhat perplexed by this. 04/19/18 on evaluation today patient's wound actually appears to be showing signs of epithelialization around the edge of the wound and in general actually appears to be doing better which is good news. He did have the same burning after about three days with applying the Endoform last week in the same fashion that I would generally apply a skin substitute. This seems to indicate that it's not the oasis to cause the problem but potentially the moisture buildup that just causes things to burn or there may be some other reaction with the skin prep or Steri-Strips. Nonetheless I'm not sure that is gonna be able to tolerate any skin substitute for a long period of time. The good news is the wound actually appears to be doing better today compared to last week and does seem to finally be making some progress. 04/26/18 on evaluation today patient  actually appears to be doing rather well in regard to his ulcer in the right gluteal fold. He has been tolerating the dressing changes without complication which is good news. The Endoform does seem to be helping although he was a little bit more macerated this week. This seems to be an ongoing issue with fluid  control at this point. Nonetheless I think we may be able to add something like Drawtex to help control the drainage. 05/03/18 on evaluation today patient appears to actually be doing better in regard to the overall appearance of his wound. He has been tolerating the dressing changes without complication. Fortunately there appears to be no evidence of infection at this time. I really feel like his wound has shown signs as of today of turning around last week I thought so as well and definitely he could be seen in this week's overall appearance and measurements. In general I'm very pleased with the fact that he finally seems to be making a steady but sure progress. The patient likewise is very pleased. 05/17/18 on evaluation today patient appears to be doing more poorly unfortunately in regard to his ulcer. He has been tolerating the dressing changes without complication. With that being said he tells me that in the past couple of days he and his wife have noticed that we did not seem to be doing quite as well is getting dark near the center. Subsequently upon evaluation today the wound actually does appear to be doing worse compared to previous. He has been tolerating the dressing changes otherwise and he states that he is not been sitting up anymore than he was in the past from what he tells me. Still he has continued to work he states "I'm tired of dealing with this and if I have to just go home and lay in the bed all the time that's what I'll do". Nonetheless I am concerned about the fact that this wound does appear to be deeper than what it was previous. 05/24/18 upon evaluation today patient  actually presents after having been in the hospital due to what was presumed to be sepsis secondary to the wound infection. He had an elevated white blood cell count between 14 and 15. With that being said he does seem to be doing somewhat better now. His wound still is giving him some trouble nonetheless and he is obviously concerned about the fact likely talked about that this does seem to go more deeply than previously noted. I did review his wound culture which showed evidence of Staphylococcus aureus him and group B strep. Nonetheless he is on antibiotics, Levaquin, for this. Subsequently I did review his intake summary from the hospital as well. I also did look at the CT of the lumbar spine with contrast that was performed which showed no bone destruction to suggest lumbar disguises/osteomyelitis or sacral osteomyelitis. There was no paraspinal abscess. Nonetheless it appears this may have been more of just a soft tissue infection at this point which is good news. He still is [name], [name]. ([id number]) nonetheless concerned about the wound which again I think is completely reasonable considering everything he's been through recently. 05/31/18 on evaluation today on evaluation today patient actually appears to be showing signs of his wound be a little bit deeper than what I would like to see. Fortunately he does not show any signs of significant infection although his temperature was 99 today he states he's been checking this at home and has not been elevated. Nonetheless with the undermining that I'm seeing at this point I am becoming more concerned about the wound I do think that offloading is a key factor here that is preventing the speedy recovery at this point. There does not appear to be any evidence of again over infection noted. He's been using Santyl currently. 06/07/18 the  patient presents today for follow-up evaluation regarding the left ulcer in the gluteal region. He has been  tolerating the Wound VAC fairly well. He is obviously very frustrated with this he states that to mean is really getting in his way. There does not appear to be any evidence of infection at this time he does have a little bit of odor I do not necessarily associate this with infection just something that we sometimes notice with Wound VAC therapy. With that being said I can definitely catch a tone of discontentment overall in the patient's demeanor today. This when he was previously in the hospital an CT scan was done of the lumbar region which did not reveal any signs of osteomyelitis. With that being said the pelvis in particular was not evaluated distinctly which means he could still have some osteonecrosis I. Nonetheless the Wound VAC was started on Thursday I do want to get this little bit more time before jumping to a CT scan of the pelvis although that is something that I might would recommend if were not see an improvement by that time. 06/14/18 on evaluation today patient actually appears to be doing about the same in regard to his right gluteal ulcer. Again he did have a CT scan of the lumbar spine unfortunately this did not include the pelvis. Nonetheless with the depth of the wound that I'm seeing today even despite the fact that I'm not seeing any evidence of overt cellulitis I believe there's a good chance that we may be dealing with osteomyelitis somewhere in the right Ischial region. No fevers, chills, nausea, or vomiting noted at this time. 06/21/18 on evaluation today patient actually appears to be doing about the same with regard to his wound. The tunnel at 6 o'clock really does not appear to be any deeper although it is a little bit wider. I think at this point you may want to start packing this with white phone. Unfortunately I have not got approval for the CT scan of the pelvis as of yet due to the fact that Medicare apparently has been denied it due to the diagnosis codes not  being appropriate according to Medicare for the test requested. With that being said the patient cannot have an MRI and therefore this is the only option that we have as far as testing is concerned. The patient has had infection and was on antibiotics and been added code for cellulitis of the bottom to see if this will be appropriate for getting the test approved. Nonetheless I'm concerned about the infection have been spread deeper into the Ischial region. 06/28/18 on evaluation today patient actually appears to be doing rather well all things considered in regard to the right gluteal ulcer. He has been tolerating the dressing changes without complication. With that being said the Wound VAC he states does have to be replaced almost every day or at least reinforced unfortunately. Patient actually has his CT scan later this morning we should have the results by tomorrow. 07/05/18 on evaluation today patient presents for follow-up concerning his right Ischial ulcer. He did see the surgeon Dr. Lysle Pearl last week. They were actually very happy with him and felt like he spent a tremendous amount of time with them as far as discussing his situation was concerned. In the end Dr. Lysle Pearl did contact me as well and determine that he would not recommend any surgical intervention at this point as he felt like it would not be in the patient's best  interest based on what he was seeing. He recommended a referral to infectious disease. Subsequently this is something that Dr. Ines Bloomer office is working on setting up for the patient. As far as evaluation today is concerned the patient's wound actually appears to be worse at this point. I am concerned about how things are progressing and specifically about infection. I do not feel like it's the deeper but the area of depth is definitely widening which does have me concerned. No fevers, chills, nausea, or vomiting noted at this time. I think that we do need initiate antibiotic  therapy the patient has an allow allergy to amoxicillin/penicillin he states that he gets a rash since childhood. Nonetheless she's never had the issues with Catholics or cephalosporins in general but he is aware of. 07/27/18 on evaluation today patient presents following admission to the hospital on 07/09/18. He was subsequently discharged on 07/20/18. On 07/15/18 the patient underwent irrigation and debridement was soft tissue biopsy and bone biopsy as well as placement of a Wound VAC in the OR by Dr. Celine Ahr. During the hospital course the patient was placed on a Wound VAC and recommended follow up with surgery in three weeks actually with Dr. Delaine Lame who is infectious disease. The patient was on vancomycin during the hospital course. 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 He also had a bone culture which revealed evidence of methicillin-resistant staph aureus. He is updated CT scan 07/09/18 reveals that he had progression of the which was performed on wound to breakdown down to the trochanter where he actually had irregularities there as well suggestive of osteomyelitis. This was a change just since 9 December when we last performed a CT scan. Obviously this one had gone downhill quite significantly and rapidly. At this point upon evaluation I feel like in general the patient's wound seems to be doing fairly well all things considered upon my evaluation today. Obviously this is larger and deeper than what I previously evaluated but at the same time he seems to be making some progress as far as the appearance of the granulation tissue is concerned. I'm happy in that regard. No fevers, chills, nausea, or vomiting noted at this time. He is on IV vancomycin and Rocephin at the facility. He is currently in [employer]. 08/03/18 upon evaluation today patient's wound appears to be doing better in regard to the overall appearance at this point in time.  Fortunately he's been tolerating the Wound VAC without complication and states that the facility has been taking excellent care of the wound site. Overall I see some Slough noted on the surface which I am going to attempt sharp debridement today of but nonetheless other than this I feel like he's making progress. 08/09/18 on evaluation today patient's wound appears to be doing much better compared to even last week's evaluation. Do believe that the Wound VAC is been of great benefit for him. He has been tolerating the dressing changes that is the Wound VAC without any complication and he has excellent granulation noted currently. There is no need for sharp debridement at this point. 08/16/18 on evaluation today patient actually appears to be doing very well in regard to the wound in the right gluteal fold region. This is showing signs of progress and again appears to be very healthy which is excellent news. Fortunately there is no sign of active infection by way of odor or drainage at this point. Overall I'm very pleased with how things  stand. He seems to be tolerating the Wound VAC without complication. 08/23/18 on evaluation today patient actually appears to be doing better in regard to his wound. He has been tolerating the Wound VAC without complication and in fact it has been collecting a significant amount of drainage which I think is good news especially considering how the wound appears. Fortunately there is no signs of infection at this time definitely nothing appears to be worse which is good news. He has not been started on [place]. ([id number]) the Bactrim and Flagyl that was recommended by Dr. Delaine Lame yet. I did actually contact her office this morning in order to check and see were things are that regard their gonna be calling me back. 08/30/18 on evaluation today patient actually appears to show signs of excellent improvement today compared to last evaluation. The undermining  is getting much better the wound seems to be feeling quite nicely and I'm very pleased that the granulation in general. With that being said overall I feel like the patient has made excellent progress which is great news. No fevers, chills, nausea, or vomiting noted at this time. 09/06/18 on evaluation today patient actually appears to be doing rather well in regard to his right gluteal ulcer. This is showing signs of improvement in overall I'm very pleased with how things seem to be progressing. The patient likewise is please. Overall I see no evidence of infection he is about to complete his oral antibiotic regimen which is the end of the antibiotics for him in just about three days. 09/13/18 on evaluation today patient's right Ischial ulcer appears to be showing signs of continued improvement which is excellent news. He's been tolerating the dressing changes without complication. Fortunately there's no signs of infection and the wound that seems to be doing very well. 09/28/18 on evaluation today patient appears to be doing rather well in regard to his right Ischial ulcer. He's been tolerating the Wound VAC without complication he knows there's much less drainage than there used to be this obviously is not a bad thing in my pinion. There's no evidence of infection despite the fact is but nothing about it now for several weeks. 10/04/18 on evaluation today patient appears to be doing better in regard to his right Ischial wound. He has been tolerating the Wound VAC without complication and I do believe that the silver nitrate last week was beneficial for him. Fortunately overall there's no evidence of active infection at this time which is great news. No fevers, chills, nausea, or vomiting noted at this time. 10/11/18 on evaluation today patient actually appears to be doing rather well in regard to his Ischial ulcer. He's been tolerating the Wound VAC still without complication I feel like this is doing a  good job. No fevers, chills, nausea, or vomiting noted at this time. 11/01/18 on evaluation today patient presents after having not been seen in our clinic for several weeks secondary to the fact that he was on evaluation today patient presents after having not been seen in our clinic for several weeks secondary to the fact that he was in a skilled nursing facility which was on lockdown currently due to the covert 19 national emergency. Subsequently he was discharged from the facility on this past Friday and subsequently made an appointment to come in to see yesterday. Fortunately there's no signs of active infection at this time which is good news and overall he does seem to have made progress since I last  saw. Overall I feel like things are progressing quite nicely. The patient is having no pain. 11/08/18 on evaluation today patient appears to be doing okay in regard to his right gluteal ulcer. He has been utilizing a Wound VAC home health this changing this at this point since he's home from the skilled nursing facility. Fortunately there's no signs of obvious active infection at this time. Unfortunately though there's no obvious active infection he is having some maceration and his wife states that when the sheets of the Wound VAC office on Sunday when it broke seal that he ended up having significant issues with some smell as well there concerned about the possibility of infection. Fortunately there's No fevers, chills, nausea, or vomiting noted at this time. 11/15/18 on evaluation today patient actually appears to be doing well in regard to his right gluteal ulcer. He has been tolerating the dressing changes without complication. Specifically the Wound VAC has been utilized up to this point. Fortunately there's no signs of infection and overall I feel like he has made progress even since last week when I last saw him. I'm actually fairly happy with the overall appearance although he does seem to  have somewhat of a hyper granular overgrowth in the central portion of the wound which I think may require some sharp debridement to try flatness out possibly utilizing chemical cauterization following. 11/23/18 on evaluation today patient actually appears to be doing very well in regard to his sacral ulcer. He seems to be showing signs of improvement with good granulation. With that being said he still has the small area of hyper granulation right in the central portion of the wound which I'm gonna likely utilize silver nitrate on today. Subsequently he also keeps having a leak at the 6 o'clock location which is unfortunate we may be able to help out with some suggestions to try to prevent this going forward. Fortunately there's no signs of active infection at this time. 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 He's been tolerating the dressing changes without complication. Fortunately there's no signs of active infection at this time. I've been rather pleased with how things have progressed there still some evidence of pressure getting to the area with some redness right around the immediate wound opening. Nonetheless other than this I'm not seeing any significant complications or issues the wound is somewhat hyper granular. Upon discussing with the patient and his wife today I'm not sure that the wound is being packed to the base with the foam at this point. And if it's not been packed fully that may be part of the reason why is not seen as much improvement as far as the granulation from the base out. Again we do not want pack too tightly but we need some of the firm to get to the base of the wound. I discussed this with patient and his wife today. 12/06/18 on evaluation today patient appears to be doing well in regard to his right gluteal pressure ulcer. He's been tolerating the dressing  changes without complication. Fortunately there's no signs of active infection. He still has some hyper granular tissue and I do think it would be appropriate to continue with the chemical cauterization as of today. 12/16/18 on evaluation today patient actually appears to be doing okay in regard to his right gluteal ulcer. He is been tolerating the dressing changes without complication including  the Wound VAC. Overall I feel like nothing seems to be worsening I do feel like that the hyper granulation buds in the central portion of the wound have improved to some degree with the silver nitrate. We will have to see how things continue to progress. 12/20/18 on evaluation today patient actually appears to be doing much worse in my pinion even compared to last week's evaluation. Unfortunately as opposed to showing any signs of improvement the areas of hyper granular tissue in the central portion of the wound seem to be getting worse. Subsequently the wound bed itself also seems to be getting deeper even compared to last week which is both unusual as well as concerning since prior he had been shown signs of improvement. Nonetheless I think that the issue could be that he's actually having some difficulty in issues with a deeper infection. There's no external signs of infection but nonetheless I am more worried about the internal, osteomyelitis, that could be restarting. He has not been on antibiotics for some time at this point. I think that it may be a good idea to go ahead and started back on an antibiotic therapy while we wait to see what the testing shows. [name], HOKENSON ([id number]) 12/27/18 on evaluation today patient presents for follow-up concerning his left gluteal fold wound. Fortunately he appears to be doing well today. I did review the CT scan which was negative for any signs of osteomyelitis or acute abnormality this is excellent news. Overall I feel like the surface of the wound bed appears  to be doing significantly better today compared to previously noted findings. There does not appear any signs of infection nor does he have any pain at this time. 01/03/19 on evaluation today patient actually appears to be doing quite well in regard to his ulcer. Post debridement last week he really did not have too much bleeding which is good news. Fortunately today this seems to be doing some better but we still has some of the hyper granular tissue noted in the base of the wound which is gonna require sharp debridement today as well. Overall I'm pleased with how things seem to be progressing since we switched away from the Wound VAC I think he is making some progress. 01/10/19 on evaluation today patient appears to be doing better in regard to his right gluteal fold ulcer. He has been tolerating the dressing changes without complication. The debridement to seem to be helping with current away some of the poor hyper granular tissue bugs throughout the region of his gluteal fold wound. He's been tolerating the dressing changes otherwise without complication which is great news. No fevers, chills, nausea, or vomiting noted at this time. 01/17/19 on evaluation today patient actually appears to be doing excellent in regard to his wound. He's been tolerating the dressing changes without complication. Fortunately there is no signs of active infection at this time which is great news. No fevers, chills, nausea, or vomiting noted at this time. 01/24/19 on evaluation today patient actually appears to be doing quite well with regard to his ulcer. He has been tolerating the dressing changes without complication. Fortunately there's no signs of active infection at this time. Overall been very pleased with the progress that he seems to be making currently. 01/31/19-Patient returns at 1 week with apparent similarity in dimensions to the wound, with no signs of infection, he has been changing dressings twice a  day 02/08/19 upon evaluation today patient actually appears to be doing  well with regard to his right Ischial ulcer. The wound is not appear to be quite as deep and seems to be making progress which is good news. With that being said I'm still reluctant to go back to the Wound VAC at this point. He's been having to change the dressings twice a day which is a little bit much in my pinion from the wound care supplies standpoint. I think that possibly attempting to utilize extras orbit may be beneficial this may also help to prevent any additional breakdown secondary to fluid retention in the wound itself. The patient is in agreement with giving this a try. 02/15/19 on evaluation today patient actually appears to be doing decently well with regard to his ulcer in the right to gluteal fold location. He's been tolerating the dressing changes without complication. Fortunately there is no signs of active infection at this time. He is able to keep the current dressing in place more effectively for a day at a time whereas before he was having a changes to to three times a day. The actions or has been helpful in this regard. Fortunately there's no signs of anything getting worse and I do feel like he showing signs of good improvement with regard to the wound bed status. 02/22/2019 patient appears to be doing very well today with regard to his ulcer in the gluteal fold. Fortunately there is no signs of active infection and he has been tolerating the dressing changes without any complication. Overall extremely pleased with how things seem to be progressing. He has much less of the hyper granular projections within the wound these have slowly been debrided away and he seems to be doing well. The wound bed is more uniform. 03/01/19 on evaluation today patient appears to be doing unfortunately about the same in regard to his gluteal ulcer. He's been tolerating the dressing changes without complication. Fortunately  there's no signs of active infection at this time. With that being said he continues to develop these hyper granular projections which I'm unsure of exactly what they are and why they are rising. Nonetheless I explained to the patient that I do believe it would be a good idea for [place] to stand a biopsy sample for pathology to see if that can shed any light on what exactly may be going on here. Fortunately I do not see any obvious signs of infection. With that being said the patient has had a little bit more drainage this week apparently compared to last week. 03/08/2019 on evaluation today patient actually appears to look somewhat better with regard to the appearance of his wound bed at this time. This is good news. Overall I am very pleased with how things seem to have progressed just in the past week with a switch to the [facility] dressing. I think that has been beneficial for him. With that being said at this time the patient is concerned about his biopsy that I sent off last week unfortunately I do not have that report as of yet. Nonetheless we have called to obtain this and hopefully will hear back from the lab later this morning. 03/15/19 on evaluation today patient's wound actually appears to be doing okay today with regard to the overall appearance of the wound bed. He has been tolerating the dressing changes without complication he still has hyper granular tissue noted but fortunately that seems to be minimal at this point compared to some of what we've seen in the past. Nonetheless I do  think that he is still having some issues currently with some of this type of granulation the biopsy and since all showed nothing more than just evidence of granulation tissue. Therefore there really is nothing different to initiate or do at this point. 03/24/19 on evaluation today patient appears to be doing a little better with regard to his ulcer. He's been tolerating the dressing changes  without complication. Fortunately there is no signs of active infection at this time. No fevers, chills, nausea, or vomiting noted at this time. I'm overall pleased with how things seem to be progressing. 03/29/2019 on evaluation today patient appears to be doing about the same in regard to his ulcer in the right gluteal fold. Unfortunately he is not seeming to make a lot of progress and the wound is somewhat stalled. There is no signs of active infection externally but I am concerned about the possibility of infection continuing in the ischial location which previously he did have infection noted. Again is not able to have an MRI so probably our best option for testing for this would be a triple phase bone scan which will detect subtle changes in the bone more so than plain film x-rays. In the past they really have not been beneficial and in fact the CT scans even have been somewhat questionable at times. Nonetheless there is no signs of systemic infection which is at least good news but again his wound is not healing at all on the predicted schedule. 04/05/19 on evaluation today patient appears to be doing well all things considering with regard to his wound and the right gluteal fold. He actually has his triple bone scan scheduled for sometime in the next couple of days. With that being said I've also been looking to other possibilities of what could be causing hyper granular tissue were looking into the bone scan again for evaluating for the risk or possibility of infection deeper and I'm also [name], [name]. ([id number]) go ahead and see about obtaining a deep tissue culture today to send and see if there's any evidence of infection noted on culture. He's in agreement with that plan. 04/12/2019 on evaluation today patient presents for reevaluation here in our clinic concerning ongoing issues with his right gluteal fold ulcer. 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 It was discussed with him at that point that I think it would be appropriate for him to proceed with hyperbaric oxygen therapy especially in light of the fact that he is previously been on IV antibiotics at the beginning of the year for close to 3 months followed by several weeks of oral antibiotics that was all prescribed by infectious disease. He had surgical debridement around Christmas December 2019 due to an abscess and osteomyelitis of the ischial bone. Unfortunately this has not really proceeded as well as we would have liked and again we did a CT scan even a couple months ago as he cannot have an MRI secondary to having issues with both a pain pump as well as a spinal cord stimulator which prevent him from going into an MRI machine. With that being said there were chronic changes noted at the ischial tuberosity which had progressed since December 2019 there was no evidence of fluid collection on that initial CT scan. With that being said that was on January 21, 2019. I am not sure that it  was a sensitive enough test as compared to an MRI and then subsequently I ordered a bone scan for him which was actually completed on 03/29/2019 and this revealed that he does indeed have positive osteomyelitis involving the right ischial tuberosity. This is adjacent to the ulcer and I think is the reason that his ulcer is not healing. Subsequently I am in a place him back on oral antibiotics today unfortunately his wound culture showed just mixed gram-negative flora with no specific findings of a predominant organism. Nonetheless being with the location I think a good broad-spectrum antibiotic for gram-negative's is a good choice at this point he is previously taken Levaquin without significant issues and I think that is an appropriate antibiotic for him at this time. 04/19/2019 on evaluation today patient actually  appears to be doing fairly well with regard to his wound. He has been tolerating the dressing changes without complication. Fortunately there is no signs of active infection and he has been taking the oral antibiotics at this time. Subsequently we did make a referral to infectious disease although Dr. Steva Ready wants the patient to be seen by general surgery first in order apparently to see if there is anything from a surgical standpoint that should be done prior to initiation of IV antibiotic therapy. Again the patient is okay with actually seeing Dr. Celine Ahr whom he has seen before we will make that referral. 04/26/2019 on evaluation today patient actually appears to be doing well with regard to his wound. He has been tolerating the dressing changes without complication. Fortunately there is no signs of active infection at this time. I do believe that the hyperbaric oxygen therapy along with the antibiotics which I prescribed at this point have been doing well for him. With that being said he has not seen Dr. Celine Ahr the surgeon yet in fact they have not been contacted for scheduling an appointment as of yet. Subsequently the patient is not on antibiotics currently by IV Dr. Steva Ready did want him to see Dr. Celine Ahr first which we are working on trying to get scheduled. We again give the information to the patient today for Dr. Celine Ahr and her number as far as contacting their office to see about getting something scheduled. Again were looking for whether or not they recommend any surgical intervention. 05/03/2019 on evaluation today patient appears to be doing about the same with regard to his wound there may be a little bit of filling in in regard to the base of the wound but still he has quite a bit of hyper granular tissue that I am seeing today. I believe that he may may need a more aggressive sharp debridement possibly even by the surgeon or under anesthesia in order to clear away some of the  hyper granular material in order to help allow for appropriate granulation to fill in. We have made a referral to Dr. Celine Ahr unfortunately it sounds as if they did not receive the referral we contacted them today they should be get in touch with the patient. Depending on how things show from that standpoint the patient may need to see Dr. Ardyth Gal who is the infectious disease specialist although he really does not want a PICC line again. No fevers, chills, nausea, vomiting, or diarrhea. He is tolerating hyperbaric oxygen therapy very well. 05/12/2019 on evaluation today patient presents for follow-up visit concerning his right gluteal fold ulcer. He did see Dr. Celine Ahr the surgeon who previously evaluated his wound and she actually felt like  he was doing quite well with regard to the wound based on what she was seen. He does seem to be responding to some degree with the oral antibiotics along with hyperbaric oxygen therapy at this point she did not see any evidence or need for further surgical intervention at this point. She recommended deferring additional or ongoing antibiotic therapy to Dr. Steva Ready at her discretion. Fortunately there is no signs of active systemic infection. No fevers, chills, nausea, vomiting, or diarrhea. 10/29; patient I am seeing really for the first time today in terms of his wound. 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 He is being treated with hyperbaric oxygen for underlying osteomyelitis most recently documented by a three-phase bone scan. He has been on Levaquin for roughly a month and he is out of these and asked me for a refill. Most recent cultures were negative although I do not think these were bone cultures. He has a very irregular surface to this wound which is almost nodular in texture. It undermines superiorly with the same nodular hyper granulated surface. I see that he was referred to general surgery for an operative debridement  although the surgeon did not agree with this I think that would have been the proper course of action in this. He has been using Hydrofera Blue. He is supposed to start a wound VAC tomorrow which is actually a reapplication of wound VAC. I think mostly last time they had trouble keeping the seal in place I hope we have better look at this this time. 05/24/2019 on evaluation today I did review patient's note from Dr. Dellia Nims where he was seen last week when I was out of the office. Subsequently it does appear that Dr. Dellia Nims was in agreement that the patient really needed debridement to clear away some of the nodular tissue in the base of the wound. The good news is he has the wound VAC initiated and some of this tissue is already starting to breakdown under the treatment of the wound VAC again because it is not really viable. Nonetheless I do believe that we can likely perform some debridement today to clear this away and hopefully the continuation of the wound VAC along with hyperbarics and antibiotic therapy will be beneficial in stopping this from reoccurring. If we get better control in the wound bed in a good spot I think we can definitely use the PriMatrix which we actually did get approval for. 05/31/2019 on evaluation today patient appears to be doing actually pretty well at this point with regard to his wound. He has been tolerating the dressing changes which is good news. Fortunately there is no signs of infection. He still has some of the hyper granular nodules noted we have not really cleared all these away and I think we can try to do a little bit more that today. I did do quite a bit last week I am hoping to get down to the base of the wound so though actually be able to heal more appropriately. I think the wound VAC is doing a good job right now with controlling the drainage and overall the patient is happy with that. 06/07/2019 upon evaluation today patient appears to be doing quite well  compared to last week with regard to the hyper granulation in the base of the wound. He has been tolerating the dressing changes without complication. Fortunately there is no signs of active infection at this time. With that being said we  have not had any recent measures as far as blood work is concerned I think we may want to check a few things including a CBC, sed rate, and C-reactive protein. Patient is in agreement with that we can try to get that scheduled for him for this Friday. 06/13/2019 on evaluation today patient actually appears to be doing much better at this point based on what I am seeing with regard to his wound. He [name], [name]. ([id number]) has much less in the way of hyper granular tissue which is good news and a lot more healing that is taken place since I last saw him as far as the amount of space within the wound itself. With that being said he is nearing the completion of the initial 40 treatments for hyperbaric oxygen therapy I think this is something I would recommend extending as he does seem to be benefiting at this time. Fortunately there is no evidence of active systemic infection at this point nor even local infection for that matter. 06/20/2019 upon evaluation today patient actually appears to be doing well with regard to his wound. In fact this appears to be doing much better today compared to what it was previous. I been extremely pleased with the progress that he is made. In fact the tunnel is much less deep today than what it was in the past and I am seeing excellent signs of improvement. Overall I am happy with how things are going. I did review his white blood cell count which revealed everything to be okay. The one abnormal finding on his CBC was a hemoglobin of 12.7. Subsequently I did also review his sed rate and C-reactive protein. His sed rate measured in at normal level measuring at 26. The C-reactive protein however was elevated today and an  abnormal range indicating inflammation. Still I feel like he is trending in a good direction at this time. He is going to need a refill of the Levaquin he tells me at this point. 07/04/2019 on evaluation today patient actually appears to be doing well in regard to his wound. He has been tolerating the dressing changes without complication. Fortunately there is no signs of active infection which is good news. No fevers, chills, nausea, vomiting, or diarrhea. The good news also is that I did review his labs and though his sed rate was slightly elevated the C-reactive protein was actually down compared to the last evaluation 2 weeks ago this is excellent news I think it is showing signs of improvement. 07/11/2019 on evaluation today patient actually appears to be doing quite well with regard to his wound on my evaluation. This is very slow going but nonetheless he seems to be making progress. Especially in regard to the depth. Nonetheless I believe that we may want to consider going forward with the PriMatrix which was previously approved for him. We will get a have to get a new approval as we will actually be applying this in the new year so we will likely obtain that approval on January 4. In the meantime and for the time being my suggestion is going to be that we go ahead and continue with the wound VAC and we were going at this point with the current wound care measures. 12/28-Patient seen after HBO session today, wound depth with the tunnel measures 3.2 cm, the wound bed appears healthy, continuing with the VAC and HBO 07/25/2019 on evaluation today patient's wound appears to be doing about the same compared to my  last evaluation. Fortunately there is no evidence of significant infection which is good news we will still waiting on the results of his lab work which unfortunately was not done prior to today's visit which is what we were aiming for. Ultimately there is no sign of active infection at  this time which is good news. And he also tells me that the wound VAC has been staying in place and doing fairly well which is also good news. With that being said I am concerned about the fact that the wound has not progressed as much I think we may want to look into getting reapproval for the skin substitute, specifically PriMatrix, that I am hopeful will help to allow this area to granulate in more effectively. I would recommend as well continuing with the wound VAC along with this. 08/01/2019 on evaluation today patient appears to be doing in my opinion slightly better in regard to the wound. He actually does have some tightening of the walls around the edges which is a good thing. Fortunately there is no signs of active infection at this time. No fever chills noted. I do believe that the patient in general seems to be making progress although is very slow. Obviously were trying to get things to speed up somewhat more. No fevers, chills, nausea, vomiting, or diarrhea. 08/08/2019 upon evaluation today patient appears to be doing well with regard to his wound although there is not a significant improvement overall we do have the PriMatrix for availability today to apply. Fortunately there is no signs of active infection and the patient overall seems to be doing well. There is really no need for aggressive sharp debridement at this point either which is also good news. I do believe her get a need to suture and the primary checks in the basin in the PriMatrix in order to keep it in place at the base of the wound underneath the wound VAC. This was discussed with the patient today as well. He is in agreement the plan. He really does not have any filling in the area and so this should not be a complication as far as suturing is concerned. 08/15/2019 on evaluation today patient appears to be doing somewhat better in my opinion in regard to the overall appearance of his wound. He has been tolerating the  dressing changes without complication. Fortunately the skin subappear to still be in place when this was changed on Friday. With that being said it appears that the PriMatrix may have dissolved in the interim between now and then as the sutures are no longer holding anything in place and again I would sutured it into place. Nonetheless I believe that we can go ahead and likely consider going forward that we may need to apply the PriMatrix weekly as opposed every other week as it does seem to have dissolved rather readily and well. Overall the wound bed appears to be doing excellent at this time. 08/22/2019 upon evaluation today the patient does seem to making some progress with regard to his wound. Overall there is some better quality tissue and less of the hyper granular protrusions all of which is good news. With that being said we do have the PriMatrix available for application today and I am in a try to find a better way to suture this in place for him today. 08/29/2019 on evaluation today patient appears to be doing very well in regard to his wound. I do see signs of new granulation which seems  to be somewhat helpful he did have some need for sharp debridement today to clear some of the hyper granulation away but nonetheless I feel like we are headed in a good direction. Fortunately there is no evidence of any systemic infection although we are going to go ahead and reorder the CBC, C-reactive protein, and sed rate today to see where things stand I did just refill his Levaquin as well. 09/05/2019 upon evaluation today patient's wound actually is not appearing to be showing signs of significant improvement compared to what we have seen previous from the standpoint of depth. He overall wound size wise seems to be about the same and again some of the quality of the wound bed is still doing fairly well. I did review his labs and his CBC was normal with no signs of elevation white blood cell count, his  C-reactive protein was normal, and the sed rate was slightly elevated but again I think this may just be due to ulceration really there is nothing significant to indicate severe infection. I think when he likely finishes his current round of oral antibiotics with Levaquin will likely take him off of the medication at that point based on the findings. I do feel like the hyperbarics helped in getting the infection under control as well as improving the overall size of the wound quite significantly. With that being said at this point I really feel like more more at the time frame that he may benefit from a muscle flap in order to try to help this wound heal effectively. I think that this would be the fastest way in order to do this. In the past he has been somewhat resistant to going down that path but nonetheless I feel like he likely should. [name], [name] ([id number]) 09/12/2019 on evaluation today patient appears to be doing fairly well in regard to his wound. He is tolerating the wound VAC without complication is still continues to drain significantly. Fortunately there is no signs of active infection at this time which is great news his lab work is also been doing very well. With that being said I do think at this point that the patient is still deriving benefit from the wound VAC especially in regard to controlling moisture which I think it is doing fairly well. He also has some improvement in the overall wound bed and on the medial aspect of the wound there are some hyper granular projections but nothing as severe as what they have been in the past. I am good have to perform some sharp debridement at this location today. 09/19/2019 upon evaluation today patient appears to be doing roughly the same although again the tissue does seem to be showing some signs of improvement which is good news. There is less hyper granular budding. Nonetheless he still is going require some sharp debridement  today. We did get his albumin and prealbumin results back and they are within normal limits in both cases which is excellent news that was the one thing [facility] plastic surgery was waiting on order to get him scheduled. He does seem to be a good candidate physically for the flap surgery if he decides to go that direction. With that being said is still left to be determined once they see the wound what they feel about how best to proceed. Obviously in the end it would be up to Mr. Rouland as to whether he goes forward with the flap surgery or something else depending on the recommendations.  09/26/2019 upon evaluation today patient appears to be doing about the same in regard to his wound. Fortunately there is no signs of active infection there is also not as much hyper granulation noted today in fact I do not believe there is can be any sharp debridement required at this point. With that being said I do believe he continues to be somewhat stalled at this point with regard to his wound. There is no signs of active infection at this time which is good news. He does have an appointment with [facility] plastic surgery on Friday. 10/03/2019 upon evaluation today patient appears to be doing somewhat poorly especially in his overall demeanor compared to last week's evaluation. He unfortunately did see the physician at [facility] plastic surgery. Unfortunately the plan for the possibility of a flap would be a fairly significant surgery that the patient is really not wanting to take the risk of as if the surgery failed he would end up with a very large open wound at that point. Fortunately there is no signs of active infection at this time. No fevers, chills, nausea, vomiting, or diarrhea. With that being said there was some question that the physician had about why this would not be healing and the possibility that there may be some bone still needs to be removed deeper in the wound. Again the bone scan that we did previous  actually did show evidence of there still being a chronic osteomyelitis back on March 29, 2019. With that being said he did undergo treatment both with hyperbarics as well as oral antibiotics for quite some time since that point. Obviously he may need additional and updated imaging with the surgeon in order to see if there is anything else that can or will need to be done in order to help this area to heal. Obviously I still believe as well that there is something going on here. He is already seeing the surgeon that performed the original surgery after the bone scan but they did not feel that anything needed to be done from a surgical standpoint at that time. Electronic Signature(s) Signed: 10/03/2019 9:29:49 AM By: Worthy Keeler [name] Entered By: Worthy Keeler on 10/03/2019 09:29:49 [name], [name] ([id number]) -------------------------------------------------------------------------------- Physical Exam Details Patient Name: [name] Date of Service: 10/03/2019 8:00 AM Medical Record Number: [id number] Patient Account Number: [id number] Date of Birth/Sex: May 29, 1951 (69 y.o. M) Treating RN: Primary Care Provider: [name] Other Clinician: Referring Provider: [name] Treating Provider/Extender: [name], Jacub Waiters Weeks in Treatment: 4 Constitutional Well-nourished and well-hydrated in no acute distress. Respiratory normal breathing without difficulty. Psychiatric this patient is able to make decisions and demonstrates good insight into disease process. Alert and Oriented x 3. pleasant and cooperative. Notes Upon inspection patient's wound does not appear to be doing poorly but he does seem to have some issues with the wound somewhat being stalled. In the tunnel region I really do not see significant improvement some of the other tissue is looking a little bit better but in general this is just a very stalled wound at this time. I do not see any obvious  signs of overt infection although he continues to have a significant amount of drainage. The wound VAC is helping to manage this quite nicely. Electronic Signature(s) Signed: 10/03/2019 9:30:22 AM By: Worthy Keeler [name] Entered By: Worthy Keeler on 10/03/2019 09:30:22 TARI, JOINTER ([id number]) -------------------------------------------------------------------------------- Physician Orders Details Patient Name: [name] Date of Service: 10/03/2019 8:00 AM  Medical Record Number: [id number] Patient Account Number: [id number] Date of Birth/Sex: [birth year] (69 y.o. 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 - in office o Cleanse wound with mild soap and water Anesthetic (add to Medication List) Wound #1 Right Gluteal fold o Topical Lidocaine 4% cream applied to wound bed prior to debridement (In Clinic Only). Skin Barriers/Peri-Wound Care o Skin Prep Primary Wound Dressing Wound #1 Right Gluteal fold o Saline moistened gauze - in clinic only Secondary Dressing Wound #1 Right Gluteal fold o Boardered Foam Dressing o Tegaderm - in clinic only Dressing Change Frequency Wound #1 Right Gluteal fold o Change Dressing Monday, Wednesday, Friday Follow-up Appointments Wound #1 Right Gluteal fold o Return Appointment in 1 week. Off-Loading Wound #1 Right Gluteal fold o Turn and  reposition every 2 hours - no pressure on wounded area Home Health Wound #1 Right Gluteal fold o [place] Visits - [employer] Nurse may visit PRN to address patientos wound care needs. o FACE TO FACE ENCOUNTER: MEDICARE and MEDICAID PATIENTS: I certify that this patient is under my care and that I had a face-to- face encounter that meets the physician face-to-face encounter requirements with this patient on this date. The encounter with the patient was in whole or in part for the following MEDICAL CONDITION: (primary reason for [place]) MEDICAL NECESSITY: NOVAK, RIEHM ([id number]) I certify, that based on my findings, NURSING services are a medically necessary home health service. HOME BOUND STATUS: I certify that my clinical findings support that this patient is homebound (i.e., Due to illness or injury, pt requires aid of supportive devices such as crutches, cane, wheelchairs, walkers, the use of special transportation or the assistance of another person to leave their place of residence. There is a normal inability to leave the home and doing so requires considerable and taxing effort. Other absences are for medical reasons / religious services and are infrequent or of short duration when for other reasons). o If current dressing causes regression in wound condition, may D/C ordered dressing product/s and apply Normal Saline Moist Dressing daily until next [place] / Other MD appointment. Martin of regression in wound condition at [phone]. o Please direct any NON-WOUND related issues/requests for orders to patient's Primary Care Physician Negative Pressure Wound Therapy Wound #1 Right Gluteal fold o Wound VAC settings at 125/130 mmHg continuous pressure. Use BLACK/GREEN foam to wound cavity. Use WHITE foam to fill any tunnel/s and/or undermining. Change VAC dressing 3 X WEEK. Change canister as indicated  when full. Nurse may titrate settings and frequency of dressing changes as clinically indicated. - Apply wound vac as normal o Home Health Nurse may d/c VAC for s/s of increased infection, significant wound regression, or uncontrolled drainage. [place] at [phone]. o Number of foam/gauze pieces used in the dressing = - 1 Electronic Signature(s) Signed: 10/03/2019 1:35:00 PM By: Worthy Keeler [name] Entered By: Worthy Keeler on 10/03/2019 09:32:00 QUENTION, SCURA ([id number]) -------------------------------------------------------------------------------- Problem List Details Patient  Name: CHEYENE, STUDE Date of Service: 10/03/2019 8:00 AM Medical Record Number: [id number] Patient Account Number: [id number] Date of Birth/Sex: 1950/11/13 (69 y.o. 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 He states that there is no history of diabetes that he is aware of. He does have issues with his prostate and is currently receiving treatment for this by way of oral medication. With that being said I do not have a lot of details in that regard. Nonetheless the patient presents today as a result of having been referred to [place] by another provider initially home health was set to come out and take care of his wound although due to the fact that he apparently drives he's not able to receive home health. His wife is therefore trying to help take care of this wound within although they have been struggling with what exactly to do at this point. She states that she can do some things but she is definitely not a nurse and does have some issues with looking at blood. The good news is the wound does not appear to be too deep and is fairly superficial at this point. There is no slough noted there is some nonviable skin noted around the surface of the wound and the perimeter at this point. The central portion of the wound appears to be very good with a dermal layer noted this does not appear to be again deep enough to extend it to subcutaneous tissue at this point. 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 In the pump he has Dilaudid and baclofen. 12/07/17  on evaluation today patient presents for follow-up concerning his ongoing lower back thigh ulcer on the right. He states that he did not get the supplies ordered and therefore has not really been able to perform the dressing changes as directed exactly. His wife was able to get some Boarder Foam Dressing's from the drugstore and subsequently has been using hydrogel which did help to a degree in the wound does appear to be able smaller. There is actually more drainage this week noted than previous. 12/21/17 on evaluation  today patient appears to be doing rather well in regard to his right gluteal ulcer. He has been tolerating the dressing changes without complication. There does not appear to be any evidence of infection at this point in time. Overall the wound does seem to be making some progress as far as the edges are concerned there's not as much in the way of overlapping of the external wound edges and he has a good epithelium to wound bed border for the most part. This however is not true right at the 12 o'clock location over the span of a little over a centimeters which actually will require debridement today to clean this away and hopefully allow it to continue to heal more appropriately. 12/28/17 on evaluation today patient appears to be doing rather well in regard to his ulcer in the left gluteal region. He's been tolerating the dressing changes without complication. Apparently he has had some difficulty getting his dressing material. Apparently there's been some confusion with ordering we're gonna check into this. Nonetheless overall he's been showing signs of improvement which is good news. Debridement is not required today. 01/04/18 on evaluation today patient presents for follow-up concerning his right gluteal ulcer. He has been tolerating the dressing changes fairly well. On inspection today it appears he may actually have some maceration them concerned about the fact that he may be  developing too much moisture in and around the wound bed which can cause delay in healing. With that being said he unfortunately really has not showed significant signs of improvement since last week's evaluation in fact this may even be just the little bit/slightly larger. Nonetheless he's been having a lot of discomfort I'm not sure this is even related to the wound as he has no pain when I'm to breeding or otherwise cleaning the wound during evaluation today. Nonetheless this is something that we did recommend he talked to his pain specialist concerning. 01/11/18 on evaluation today patient appears to be doing better in regard to his ulceration. He has been tolerating the dressing changes without complication. With that being said overall there's no evidence of infection which is good news. The only thing is he did receive the hatch affair blue classic versus the ready nonetheless I feel like this is perfectly fine and appears to have done well for him over the past week. 01/25/18 on evaluation today patient's wound actually appears to be a little bit larger than during the last evaluation. The good news is the majority of the wound edges actually appear to be fairly firmly attached to the wound bed unfortunately again we're not really making progress in regard to the size. Roughly the wound is about the same size as when I first saw him although again the wound margin/edges appear to be much better. 02/01/18 on evaluation today patient actually appears to be doing very well in regard to his wound. Applying the Prisma dry does seem to be better although he does still have issues with slow progression of the wound. There was a slight improvement compared to last week's measurements today. Nonetheless I have been considering other options as far as the possibility of Theraskin or even a snap vac. In general I'm not sure that the Theraskin due to location of the wound would be a very good idea. Nonetheless  I do think that a snap vac could be a possibility for the patient and in fact I think this could even be an excellent way to manage the wound possibly  seeing some improvement in a very rapid fashion here. Nonetheless this is something that we would need to get approved and I did have a lengthy conversation with the patient about this today. 02/08/18 on evaluation today patient appears to be doing a little better in regard to his ulcer. He has been tolerating the dressing changes without complication. Fortunately despite the fact that the wound is a little bit smaller it's not significantly so unfortunately. We have discussed the possibility of a snap vac we did check with insurance this is actually covered at this point. Fortunately there does not appear to be any sign of infection. Overall I'm AYDDEN, ULERY. ([id number]) fairly pleased with how things seem to be appearing at this point. 02/15/18 on evaluation today patient appears to be doing rather well in regard to his right gluteal ulcer. Unfortunately the snap vac did not stay in place with his sheer and friction this came loose and did not seem to maintain seal very well. He worked for about two days and it did seem to do very well during that time according to his wife but in general this does not seem to be something that's gonna be beneficial for him long-term. I do believe we need to go back to standard dressings to see if we can find something that will be of benefit. 03/02/18- He is here in follow up evaluation; there is minimal change in the wound. He will continue with the same treatment plan, would consider changing to iodosrob/iodoflex if ulcer continues to to plateau. He will follow up next week 03/08/18 on evaluation today patient's wound actually appears to be about the same size as when I previously saw him several weeks back. Unfortunately he does have some slightly dark discoloration in the central portion of the wound which has  me concerned about pressure injury. I do believe he may be sitting for too long a period of time in fact he tells me that "I probably sit for much too long". He does have some Slough noted on the surface of the wound and again as far as the size of the wound is concerned I'm really not seeing anything that seems to have improved significantly. 03/15/18 on evaluation today patient appears to be doing fairly well in regard to his ulcer. The wound measured pretty much about the same today compared to last week's evaluation when looking at his graph. With that being said the area of bruising/deep tissue injury that was noted last week I do not see at this point. He did get a new cushion fortunately this does seem to be have been of benefit in my pinion. It does appear that he's been off of this more which is good news as well I think that is definitely showing in the overall wound measurements. With that being said I do believe that he needs to continue to offload I don't think that the fact this is doing better should be or is going to allow him to not have to offload and explain this to him as well. Overall he seems to be in agreement the plan I think he understands. The overall appearance of the wound bed is improved compared to last week I think the Iodoflex has been beneficial in that regard. 03/29/18 on evaluation today patient actually appears to be doing rather well in regard to his wound from the overall appearance standpoint he does have some granulation although there's some Slough on the surface of the wound noted  as well. With that being said he unfortunately has not improved in regard to the overall measurement of the wound in volume or in size. I did have a discussion with him very specifically about offloading today. He actually does work although he mainly is just sitting throughout the day. He tells me he offloads by "lifting himself up for 30 seconds off of his chair occasionally" purchase  from [employer] which does seem to have helped. And he has a new cushion that he with that being said he's also able to stand some for a very short period of time but not significant enough I think to provide appropriate offloading. I think the biggest issue at this point with the wound and the fact is not healing as quickly as we would like is due to the fact that he is really not able to appropriately offload while at work. He states the beginning after his injury he actually had a bed at his job that he could lay on in order to offload and that does seem to have been of help back at that time. Nonetheless he had not done this in quite some time unfortunately. I think that could be helpful for him this is something I would like for him to look into. 04/05/18 on evaluation today patient actually presents for follow-up concerning his right gluteal ulcer. Again he really is not significantly improved even compared to last week. He has been tolerating the dressing changes without complication. With that being said fortunately there appears to be no evidence of infection at this time. He has been more proactive in trying to offload. 04/12/18 on evaluation today patient actually appears to be doing a little better in regard to his wound and the right gluteal fold region. He's been tolerating the dressing changes since removing the oasis without complication. However he was having a lot of burning initially with the oasis in place. He's unsure of exactly why this was given so much discomfort but he assumes that it was the oasis itself causing the problem. Nonetheless this had to be removed after about three days in place although even those three days seem to have made a fairly good improvement in regard to the overall appearance of the wound bed. In fact is the first time that he's made any improvement from the standpoint of measurements in about six weeks. He continues to have no discomfort over the  area of the wound itself which leads me to wonder why he was having the burning with the oasis when he does not even feel the actual debridement's themselves. I am somewhat perplexed by this. 04/19/18 on evaluation today patient's wound actually appears to be showing signs of epithelialization around the edge of the wound and in general actually appears to be doing better which is good news. He did have the same burning after about three days with applying the Endoform last week in the same fashion that I would generally apply a skin substitute. This seems to indicate that it's not the oasis to cause the problem but potentially the moisture buildup that just causes things to burn or there may be some other reaction with the skin prep or Steri-Strips. Nonetheless I'm not sure that is gonna be able to tolerate any skin substitute for a long period of time. The good news is the wound actually appears to be doing better today compared to last week and does seem to finally be making some progress. 04/26/18 on evaluation today  patient actually appears to be doing rather well in regard to his ulcer in the right gluteal fold. He has been tolerating the dressing changes without complication which is good news. The Endoform does seem to be helping although he was a little bit more macerated this week. This seems to be an ongoing issue with fluid control at this point. Nonetheless I think we may be able to add something like Drawtex to help control the drainage. 05/03/18 on evaluation today patient appears to actually be doing better in regard to the overall appearance of his wound. He has been tolerating the dressing changes without complication. Fortunately there appears to be no evidence of infection at this time. I really feel like his wound has shown signs as of today of turning around last week I thought so as well and definitely he could be seen in this week's overall appearance and measurements. In general  I'm very pleased with the fact that he finally seems to be making a steady but sure progress. The patient likewise is very pleased. 05/17/18 on evaluation today patient appears to be doing more poorly unfortunately in regard to his ulcer. He has been tolerating the dressing changes without complication. With that being said he tells me that in the past couple of days he and his wife have noticed that we did not seem to be doing quite as well is getting dark near the center. Subsequently upon evaluation today the wound actually does appear to be doing worse compared to previous. He has been tolerating the dressing changes otherwise and he states that he is not been sitting up anymore than he was in the past from what he tells me. Still he has continued to work he states "I'm tired of dealing with this and if I have to just go home and lay in the bed all the time that's what I'll do". Nonetheless I am concerned about the fact that this wound does appear to be deeper than what it was previous. 05/24/18 upon evaluation today patient actually presents after having been in the hospital due to what was presumed to be sepsis secondary to the wound infection. He had an elevated white blood cell count between 14 and 15. With that being said he does seem to be doing somewhat better now. NATRON, NOVELLO ([id number]) His wound still is giving him some trouble nonetheless and he is obviously concerned about the fact likely talked about that this does seem to go more deeply than previously noted. I did review his wound culture which showed evidence of Staphylococcus aureus him and group B strep. Nonetheless he is on antibiotics, Levaquin, for this. Subsequently I did review his intake summary from the hospital as well. I also did look at the CT of the lumbar spine with contrast that was performed which showed no bone destruction to suggest lumbar disguises/osteomyelitis or sacral osteomyelitis. There was  no paraspinal abscess. Nonetheless it appears this may have been more of just a soft tissue infection at this point which is good news. He still is nonetheless concerned about the wound which again I think is completely reasonable considering everything he's been through recently. 05/31/18 on evaluation today on evaluation today patient actually appears to be showing signs of his wound be a little bit deeper than what I would like to see. Fortunately he does not show any signs of significant infection although his temperature was 99 today he states he's been checking this at home and has not been  elevated. Nonetheless with the undermining that I'm seeing at this point I am becoming more concerned about the wound I do think that offloading is a key factor here that is preventing the speedy recovery at this point. There does not appear to be any evidence of again over infection noted. He's been using Santyl currently. 06/07/18 the patient presents today for follow-up evaluation regarding the left ulcer in the gluteal region. He has been tolerating the Wound VAC fairly well. He is obviously very frustrated with this he states that to mean is really getting in his way. There does not appear to be any evidence of infection at this time he does have a little bit of odor I do not necessarily associate this with infection just something that we sometimes notice with Wound VAC therapy. With that being said I can definitely catch a tone of discontentment overall in the patient's demeanor today. This when he was previously in the hospital an CT scan was done of the lumbar region which did not reveal any signs of osteomyelitis. With that being said the pelvis in particular was not evaluated distinctly which means he could still have some osteonecrosis I. Nonetheless the Wound VAC was started on Thursday I do want to get this little bit more time before jumping to a CT scan of the pelvis although that is something  that I might would recommend if were not see an improvement by that time. 06/14/18 on evaluation today patient actually appears to be doing about the same in regard to his right gluteal ulcer. Again he did have a CT scan of the lumbar spine unfortunately this did not include the pelvis. Nonetheless with the depth of the wound that I'm seeing today even despite the fact that I'm not seeing any evidence of overt cellulitis I believe there's a good chance that we may be dealing with osteomyelitis somewhere in the right Ischial region. No fevers, chills, nausea, or vomiting noted at this time. 06/21/18 on evaluation today patient actually appears to be doing about the same with regard to his wound. The tunnel at 6 o'clock really does not appear to be any deeper although it is a little bit wider. I think at this point you may want to start packing this with white phone. Unfortunately I have not got approval for the CT scan of the pelvis as of yet due to the fact that Medicare apparently has been denied it due to the diagnosis codes not being appropriate according to Medicare for the test requested. With that being said the patient cannot have an MRI and therefore this is the only option that we have as far as testing is concerned. The patient has had infection and was on antibiotics and been added code for cellulitis of the bottom to see if this will be appropriate for getting the test approved. Nonetheless I'm concerned about the infection have been spread deeper into the Ischial region. 06/28/18 on evaluation today patient actually appears to be doing rather well all things considered in regard to the right gluteal ulcer. He has been tolerating the dressing changes without complication. With that being said the Wound VAC he states does have to be replaced almost every day or at least reinforced unfortunately. Patient actually has his CT scan later this morning we should have the results by  tomorrow. 07/05/18 on evaluation today patient presents for follow-up concerning his right Ischial ulcer. He did see the surgeon Dr. Lysle Pearl last week. They were actually very  happy with him and felt like he spent a tremendous amount of time with them as far as discussing his situation was concerned. In the end Dr. Lysle Pearl did contact me as well and determine that he would not recommend any surgical intervention at this point as he felt like it would not be in the patient's best interest based on what he was seeing. He recommended a referral to infectious disease. Subsequently this is something that Dr. Ines Bloomer office is working on setting up for the patient. As far as evaluation today is concerned the patient's wound actually appears to be worse at this point. I am concerned about how things are progressing and specifically about infection. I do not feel like it's the deeper but the area of depth is definitely widening which does have me concerned. No fevers, chills, nausea, or vomiting noted at this time. I think that we do need initiate antibiotic therapy the patient has an allow allergy to amoxicillin/penicillin he states that he gets a rash since childhood. Nonetheless she's never had the issues with Catholics or cephalosporins in general but he is aware of. 07/27/18 on evaluation today patient presents following admission to the hospital on 07/09/18. He was subsequently discharged on 07/20/18. On 07/15/18 the patient underwent irrigation and debridement was soft tissue biopsy and bone biopsy as well as placement of a Wound VAC in the OR by Dr. Celine Ahr. During the hospital course the patient was placed on a Wound VAC and recommended follow up with surgery in three weeks actually with Dr. Delaine Lame who is infectious disease. The patient was on vancomycin during the hospital course. 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 He also had a bone culture which revealed  evidence of methicillin-resistant staph aureus. He is updated CT scan 07/09/18 reveals that he had progression of the which was performed on wound to breakdown down to the trochanter where he actually had irregularities there as well suggestive of osteomyelitis. This was a change just since 9 December when we last performed a CT scan. Obviously this one had gone downhill quite significantly and rapidly. At this point upon evaluation I feel like in general the patient's wound seems to be doing fairly well all things considered upon my evaluation today. Obviously this is larger and deeper than what I previously evaluated but at the same time he seems to be making some progress as far as the appearance of the granulation tissue is concerned. I'm happy in that regard. No fevers, chills, nausea, or vomiting noted at this time. He is on IV vancomycin and Rocephin at the facility. He is currently in [facility]. 08/03/18 upon evaluation today patient's wound appears to be doing better in regard to the overall appearance at this point in time. Fortunately he's been tolerating the Wound VAC without complication and states that the facility has been taking excellent care of the wound site. Overall I see some Slough noted on the surface which I am going to attempt sharp debridement today of but nonetheless other than this I feel like he's making progress. 08/09/18 on evaluation today patient's wound appears to be doing much better compared to even last week's evaluation. Do believe that the Wound VAC is been of great benefit for him. He has been tolerating the dressing changes that is the Wound VAC without any complication and he has excellent granulation noted currently. There is no need for sharp debridement at this point. 08/16/18 on evaluation today patient actually appears  to be doing very well in regard to the wound in the right gluteal fold region. This is showing signs of progress and again appears  to be very healthy which is excellent news. Fortunately there is no sign of active infection by way of odor or drainage at [name], [name]. ([id number]) this point. Overall I'm very pleased with how things stand. He seems to be tolerating the Wound VAC without complication. 08/23/18 on evaluation today patient actually appears to be doing better in regard to his wound. He has been tolerating the Wound VAC without complication and in fact it has been collecting a significant amount of drainage which I think is good news especially considering how the wound appears. Fortunately there is no signs of infection at this time definitely nothing appears to be worse which is good news. He has not been started on the Bactrim and Flagyl that was recommended by Dr. Delaine Lame yet. I did actually contact her office this morning in order to check and see were things are that regard their gonna be calling me back. 08/30/18 on evaluation today patient actually appears to show signs of excellent improvement today compared to last evaluation. The undermining is getting much better the wound seems to be feeling quite nicely and I'm very pleased that the granulation in general. With that being said overall I feel like the patient has made excellent progress which is great news. No fevers, chills, nausea, or vomiting noted at this time. 09/06/18 on evaluation today patient actually appears to be doing rather well in regard to his right gluteal ulcer. This is showing signs of improvement in overall I'm very pleased with how things seem to be progressing. The patient likewise is please. Overall I see no evidence of infection he is about to complete his oral antibiotic regimen which is the end of the antibiotics for him in just about three days. 09/13/18 on evaluation today patient's right Ischial ulcer appears to be showing signs of continued improvement which is excellent news. He's been tolerating the dressing changes  without complication. Fortunately there's no signs of infection and the wound that seems to be doing very well. 09/28/18 on evaluation today patient appears to be doing rather well in regard to his right Ischial ulcer. He's been tolerating the Wound VAC without complication he knows there's much less drainage than there used to be this obviously is not a bad thing in my pinion. There's no evidence of infection despite the fact is but nothing about it now for several weeks. 10/04/18 on evaluation today patient appears to be doing better in regard to his right Ischial wound. He has been tolerating the Wound VAC without complication and I do believe that the silver nitrate last week was beneficial for him. Fortunately overall there's no evidence of active infection at this time which is great news. No fevers, chills, nausea, or vomiting noted at this time. 10/11/18 on evaluation today patient actually appears to be doing rather well in regard to his Ischial ulcer. He's been tolerating the Wound VAC still without complication I feel like this is doing a good job. No fevers, chills, nausea, or vomiting noted at this time. 11/01/18 on evaluation today patient presents after having not been seen in our clinic for several weeks secondary to the fact that he was on evaluation today patient presents after having not been seen in our clinic for several weeks secondary to the fact that he was in a skilled nursing facility which was on  lockdown currently due to the covert 19 national emergency. Subsequently he was discharged from the facility on this past Friday and subsequently made an appointment to come in to see yesterday. Fortunately there's no signs of active infection at this time which is good news and overall he does seem to have made progress since I last saw. Overall I feel like things are progressing quite nicely. The patient is having no pain. 11/08/18 on evaluation today patient appears to be doing okay in  regard to his right gluteal ulcer. He has been utilizing a Wound VAC home health this changing this at this point since he's home from the skilled nursing facility. Fortunately there's no signs of obvious active infection at this time. Unfortunately though there's no obvious active infection he is having some maceration and his wife states that when the sheets of the Wound VAC office on Sunday when it broke seal that he ended up having significant issues with some smell as well there concerned about the possibility of infection. Fortunately there's No fevers, chills, nausea, or vomiting noted at this time. 11/15/18 on evaluation today patient actually appears to be doing well in regard to his right gluteal ulcer. He has been tolerating the dressing changes without complication. Specifically the Wound VAC has been utilized up to this point. Fortunately there's no signs of infection and overall I feel like he has made progress even since last week when I last saw him. I'm actually fairly happy with the overall appearance although he does seem to have somewhat of a hyper granular overgrowth in the central portion of the wound which I think may require some sharp debridement to try flatness out possibly utilizing chemical cauterization following. 11/23/18 on evaluation today patient actually appears to be doing very well in regard to his sacral ulcer. He seems to be showing signs of improvement with good granulation. With that being said he still has the small area of hyper granulation right in the central portion of the wound which I'm gonna likely utilize silver nitrate on today. Subsequently he also keeps having a leak at the 6 o'clock location which is unfortunate we may be able to help out with some suggestions to try to prevent this going forward. Fortunately there's no signs of active infection at this time. 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 He's been tolerating the dressing changes without complication. Fortunately there's no signs of active infection at this time. I've been rather pleased with how things have progressed there still some evidence of pressure getting to the area with some redness right around the immediate wound opening. Nonetheless other than this I'm not seeing any significant complications or issues the wound is somewhat hyper granular. Upon discussing with the patient and his wife today I'm not sure that the wound is being packed to the base with the foam at this point. And if it's not been packed fully that may be part of the reason why is not seen as much improvement as far as the granulation from the base out. Again we do not want pack too tightly but we need some of the firm to get to the base of the wound. I discussed this with patient and his wife today. 12/06/18 on evaluation today patient appears to be doing well in regard to his right gluteal pressure ulcer. He's been tolerating the dressing changes without complication. Fortunately there's  no signs of active infection. He still has some hyper granular tissue and I do think it would be appropriate to continue with the chemical cauterization as of today. 12/16/18 on evaluation today patient actually appears to be doing okay in regard to his right gluteal ulcer. He is been tolerating the dressing changes without complication including the Wound VAC. Overall I feel like nothing seems to be worsening I do feel like that the hyper granulation buds in the central portion of the wound have improved to some degree with the silver nitrate. We will have to see how things continue to progress. 12/20/18 on evaluation today patient actually appears to be doing much worse in my pinion even compared to last week's evaluation. Unfortunately as opposed to showing any signs of improvement the areas of hyper granular tissue in the  central portion of the wound seem to be getting worse. Subsequently the wound bed itself also seems to be getting deeper even compared to last week which is both unusual as well as concerning since [name], [name]. ([id number]) prior he had been shown signs of improvement. Nonetheless I think that the issue could be that he's actually having some difficulty in issues with a deeper infection. There's no external signs of infection but nonetheless I am more worried about the internal, osteomyelitis, that could be restarting. He has not been on antibiotics for some time at this point. I think that it may be a good idea to go ahead and started back on an antibiotic therapy while we wait to see what the testing shows. 12/27/18 on evaluation today patient presents for follow-up concerning his left gluteal fold wound. Fortunately he appears to be doing well today. I did review the CT scan which was negative for any signs of osteomyelitis or acute abnormality this is excellent news. Overall I feel like the surface of the wound bed appears to be doing significantly better today compared to previously noted findings. There does not appear any signs of infection nor does he have any pain at this time. 01/03/19 on evaluation today patient actually appears to be doing quite well in regard to his ulcer. Post debridement last week he really did not have too much bleeding which is good news. Fortunately today this seems to be doing some better but we still has some of the hyper granular tissue noted in the base of the wound which is gonna require sharp debridement today as well. Overall I'm pleased with how things seem to be progressing since we switched away from the Wound VAC I think he is making some progress. 01/10/19 on evaluation today patient appears to be doing better in regard to his right gluteal fold ulcer. He has been tolerating the dressing changes without complication. The debridement to seem to be  helping with current away some of the poor hyper granular tissue bugs throughout the region of his gluteal fold wound. He's been tolerating the dressing changes otherwise without complication which is great news. No fevers, chills, nausea, or vomiting noted at this time. 01/17/19 on evaluation today patient actually appears to be doing excellent in regard to his wound. He's been tolerating the dressing changes without complication. Fortunately there is no signs of active infection at this time which is great news. No fevers, chills, nausea, or vomiting noted at this time. 01/24/19 on evaluation today patient actually appears to be doing quite well with regard to his ulcer. He has been tolerating the dressing changes without complication. Fortunately there's no  signs of active infection at this time. Overall been very pleased with the progress that he seems to be making currently. 01/31/19-Patient returns at 1 week with apparent similarity in dimensions to the wound, with no signs of infection, he has been changing dressings twice a day 02/08/19 upon evaluation today patient actually appears to be doing well with regard to his right Ischial ulcer. The wound is not appear to be quite as deep and seems to be making progress which is good news. With that being said I'm still reluctant to go back to the Wound VAC at this point. He's been having to change the dressings twice a day which is a little bit much in my pinion from the wound care supplies standpoint. I think that possibly attempting to utilize extras orbit may be beneficial this may also help to prevent any additional breakdown secondary to fluid retention in the wound itself. The patient is in agreement with giving this a try. 02/15/19 on evaluation today patient actually appears to be doing decently well with regard to his ulcer in the right to gluteal fold location. He's been tolerating the dressing changes without complication. Fortunately there is  no signs of active infection at this time. He is able to keep the current dressing in place more effectively for a day at a time whereas before he was having a changes to to three times a day. The actions or has been helpful in this regard. Fortunately there's no signs of anything getting worse and I do feel like he showing signs of good improvement with regard to the wound bed status. 02/22/2019 patient appears to be doing very well today with regard to his ulcer in the gluteal fold. Fortunately there is no signs of active infection and he has been tolerating the dressing changes without any complication. Overall extremely pleased with how things seem to be progressing. He has much less of the hyper granular projections within the wound these have slowly been debrided away and he seems to be doing well. The wound bed is more uniform. 03/01/19 on evaluation today patient appears to be doing unfortunately about the same in regard to his gluteal ulcer. He's been tolerating the dressing changes without complication. Fortunately there's no signs of active infection at this time. With that being said he continues to develop these hyper granular projections which I'm unsure of exactly what they are and why they are rising. Nonetheless I explained to the patient that I do believe it would be a good idea for [place] to stand a biopsy sample for pathology to see if that can shed any light on what exactly may be going on here. Fortunately I do not see any obvious signs of infection. With that being said the patient has had a little bit more drainage this week apparently compared to last week. 03/08/2019 on evaluation today patient actually appears to look somewhat better with regard to the appearance of his wound bed at this time. This is good news. Overall I am very pleased with how things seem to have progressed just in the past week with a switch to the [facility] dressing. I think that has been beneficial for  him. With that being said at this time the patient is concerned about his biopsy that I sent off last week unfortunately I do not have that report as of yet. Nonetheless we have called to obtain this and hopefully will hear back from the lab later this morning. 03/15/19 on evaluation today  patient's wound actually appears to be doing okay today with regard to the overall appearance of the wound bed. He has been tolerating the dressing changes without complication he still has hyper granular tissue noted but fortunately that seems to be minimal at this point compared to some of what we've seen in the past. Nonetheless I do think that he is still having some issues currently with some of this type of granulation the biopsy and since all showed nothing more than just evidence of granulation tissue. Therefore there really is nothing different to initiate or do at this point. 03/24/19 on evaluation today patient appears to be doing a little better with regard to his ulcer. He's been tolerating the dressing changes without complication. Fortunately there is no signs of active infection at this time. No fevers, chills, nausea, or vomiting noted at this time. I'm overall pleased with how things seem to be progressing. 03/29/2019 on evaluation today patient appears to be doing about the same in regard to his ulcer in the right gluteal fold. Unfortunately he is not seeming to make a lot of progress and the wound is somewhat stalled. There is no signs of active infection externally but I am concerned about the possibility of infection continuing in the ischial location which previously he did have infection noted. Again is not able to have an MRI so probably our best option for testing for this would be a triple phase bone scan which will detect subtle changes in the bone more so than plain film x-rays. In the past they really have not been beneficial and in fact the CT scans even have been somewhat questionable at  times. Nonetheless there is no signs of systemic infection Scriven, Sherard J. ([id number]) which is at least good news but again his wound is not healing at all on the predicted schedule. 04/05/19 on evaluation today patient appears to be doing well all things considering with regard to his wound and the right gluteal fold. He actually has his triple bone scan scheduled for sometime in the next couple of days. With that being said I've also been looking to other possibilities of what could be causing hyper granular tissue were looking into the bone scan again for evaluating for the risk or possibility of infection deeper and I'm also gonna go ahead and see about obtaining a deep tissue culture today to send and see if there's any evidence of infection noted on culture. He's in agreement with that plan. 04/12/2019 on evaluation today patient presents for reevaluation here in our clinic concerning ongoing issues with his right gluteal fold ulcer. 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 It was discussed with him at that point that I think it would be appropriate for him to proceed with hyperbaric oxygen therapy especially in light of the fact that he is previously been on IV antibiotics at the beginning of the year for close to 3 months followed by several weeks of oral antibiotics that was all prescribed by infectious disease. He had surgical debridement around Christmas December 2019 due to an abscess and osteomyelitis of the ischial bone. Unfortunately this has not really proceeded as well as we would have liked and again we did a CT scan even a couple months ago as he cannot have an MRI secondary to having issues with both a pain pump as well as a spinal cord  stimulator which prevent him from going into an MRI machine. With that being said there were chronic changes noted at the  ischial tuberosity which had progressed since December 2019 there was no evidence of fluid collection on that initial CT scan. With that being said that was on January 21, 2019. I am not sure that it was a sensitive enough test as compared to an MRI and then subsequently I ordered a bone scan for him which was actually completed on 03/29/2019 and this revealed that he does indeed have positive osteomyelitis involving the right ischial tuberosity. This is adjacent to the ulcer and I think is the reason that his ulcer is not healing. Subsequently I am in a place him back on oral antibiotics today unfortunately his wound culture showed just mixed gram-negative flora with no specific findings of a predominant organism. Nonetheless being with the location I think a good broad-spectrum antibiotic for gram-negative's is a good choice at this point he is previously taken Levaquin without significant issues and I think that is an appropriate antibiotic for him at this time. 04/19/2019 on evaluation today patient actually appears to be doing fairly well with regard to his wound. He has been tolerating the dressing changes without complication. Fortunately there is no signs of active infection and he has been taking the oral antibiotics at this time. Subsequently we did make a referral to infectious disease although Dr. Steva Ready wants the patient to be seen by general surgery first in order apparently to see if there is anything from a surgical standpoint that should be done prior to initiation of IV antibiotic therapy. Again the patient is okay with actually seeing Dr. Celine Ahr whom he has seen before we will make that referral. 04/26/2019 on evaluation today patient actually appears to be doing well with regard to his wound. He has been tolerating the dressing changes without complication. Fortunately there is no signs of active infection at this time. I do believe that the hyperbaric oxygen therapy along with the  antibiotics which I prescribed at this point have been doing well for him. With that being said he has not seen Dr. Celine Ahr the surgeon yet in fact they have not been contacted for scheduling an appointment as of yet. Subsequently the patient is not on antibiotics currently by IV Dr. Steva Ready did want him to see Dr. Celine Ahr first which we are working on trying to get scheduled. We again give the information to the patient today for Dr. Celine Ahr and her number as far as contacting their office to see about getting something scheduled. Again were looking for whether or not they recommend any surgical intervention. 05/03/2019 on evaluation today patient appears to be doing about the same with regard to his wound there may be a little bit of filling in in regard to the base of the wound but still he has quite a bit of hyper granular tissue that I am seeing today. I believe that he may may need a more aggressive sharp debridement possibly even by the surgeon or under anesthesia in order to clear away some of the hyper granular material in order to help allow for appropriate granulation to fill in. We have made a referral to Dr. Celine Ahr unfortunately it sounds as if they did not receive the referral we contacted them today they should be get in touch with the patient. Depending on how things show from that standpoint the patient may need to see Dr. Ardyth Gal who is the infectious disease specialist  although he really does not want a PICC line again. No fevers, chills, nausea, vomiting, or diarrhea. He is tolerating hyperbaric oxygen therapy very well. 05/12/2019 on evaluation today patient presents for follow-up visit concerning his right gluteal fold ulcer. He did see Dr. Celine Ahr the surgeon who previously evaluated his wound and she actually felt like he was doing quite well with regard to the wound based on what she was seen. He does seem to be responding to some degree with the oral antibiotics along with  hyperbaric oxygen therapy at this point she did not see any evidence or need for further surgical intervention at this point. She recommended deferring additional or ongoing antibiotic therapy to Dr. Steva Ready at her discretion. Fortunately there is no signs of active systemic infection. No fevers, chills, nausea, vomiting, or diarrhea. 10/29; patient I am seeing really for the first time today in terms of his wound. 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 He is being treated with hyperbaric oxygen for underlying osteomyelitis most recently documented by a three-phase bone scan. He has been on Levaquin for roughly a month and he is out of these and asked me for a refill. Most recent cultures were negative although I do not think these were bone cultures. He has a very irregular surface to this wound which is almost nodular in texture. It undermines superiorly with the same nodular hyper granulated surface. I see that he was referred to general surgery for an operative debridement although the surgeon did not agree with this I think that would have been the proper course of action in this. He has been using Hydrofera Blue. He is supposed to start a wound VAC tomorrow which is actually a reapplication of wound VAC. I think mostly last time they had trouble keeping the seal in place I hope we have better look at this this time. 05/24/2019 on evaluation today I did review patient's note from Dr. Dellia Nims where he was seen last week when I was out of the office. Subsequently it does appear that Dr. Dellia Nims was in agreement that the patient really needed debridement to clear away some of the nodular tissue in the base of the wound. The good news is he has the wound VAC initiated and some of this tissue is already starting to breakdown under the treatment of the wound VAC again because it is not really viable. Nonetheless I do believe that we can likely perform some debridement today to  clear this away and hopefully the continuation of the wound VAC along with hyperbarics and antibiotic therapy will be beneficial in stopping this from reoccurring. If we get better control in the wound bed in a good spot I think we can definitely use the PriMatrix which we actually did get approval for. 05/31/2019 on evaluation today patient appears to be doing actually pretty well at this point with regard to his wound. He has been tolerating the dressing changes which is good news. Fortunately there is no signs of infection. He still has some of the hyper granular nodules noted we have not really cleared all these away and I think we can try to do a little bit more that today. I did do quite a bit last week I am hoping to get down to the base of the wound so though actually be able to heal more appropriately. I think the wound VAC is doing a good job right now with controlling the drainage and overall the  patient is happy with that. 06/07/2019 upon evaluation today patient appears to be doing quite well compared to last week with regard to the hyper granulation in the base of the Belton, Charls J. ([id number]) wound. He has been tolerating the dressing changes without complication. Fortunately there is no signs of active infection at this time. With that being said we have not had any recent measures as far as blood work is concerned I think we may want to check a few things including a CBC, sed rate, and C-reactive protein. Patient is in agreement with that we can try to get that scheduled for him for this Friday. 06/13/2019 on evaluation today patient actually appears to be doing much better at this point based on what I am seeing with regard to his wound. He has much less in the way of hyper granular tissue which is good news and a lot more healing that is taken place since I last saw him as far as the amount of space within the wound itself. With that being said he is nearing the completion of  the initial 40 treatments for hyperbaric oxygen therapy I think this is something I would recommend extending as he does seem to be benefiting at this time. Fortunately there is no evidence of active systemic infection at this point nor even local infection for that matter. 06/20/2019 upon evaluation today patient actually appears to be doing well with regard to his wound. In fact this appears to be doing much better today compared to what it was previous. I been extremely pleased with the progress that he is made. In fact the tunnel is much less deep today than what it was in the past and I am seeing excellent signs of improvement. Overall I am happy with how things are going. I did review his white blood cell count which revealed everything to be okay. The one abnormal finding on his CBC was a hemoglobin of 12.7. Subsequently I did also review his sed rate and C-reactive protein. His sed rate measured in at normal level measuring at 26. The C-reactive protein however was elevated today and an abnormal range indicating inflammation. Still I feel like he is trending in a good direction at this time. He is going to need a refill of the Levaquin he tells me at this point. 07/04/2019 on evaluation today patient actually appears to be doing well in regard to his wound. He has been tolerating the dressing changes without complication. Fortunately there is no signs of active infection which is good news. No fevers, chills, nausea, vomiting, or diarrhea. The good news also is that I did review his labs and though his sed rate was slightly elevated the C-reactive protein was actually down compared to the last evaluation 2 weeks ago this is excellent news I think it is showing signs of improvement. 07/11/2019 on evaluation today patient actually appears to be doing quite well with regard to his wound on my evaluation. This is very slow going but nonetheless he seems to be making progress. Especially in regard  to the depth. Nonetheless I believe that we may want to consider going forward with the PriMatrix which was previously approved for him. We will get a have to get a new approval as we will actually be applying this in the new year so we will likely obtain that approval on January 4. In the meantime and for the time being my suggestion is going to be that we go ahead and continue  with the wound VAC and we were going at this point with the current wound care measures. 12/28-Patient seen after HBO session today, wound depth with the tunnel measures 3.2 cm, the wound bed appears healthy, continuing with the VAC and HBO 07/25/2019 on evaluation today patient's wound appears to be doing about the same compared to my last evaluation. Fortunately there is no evidence of significant infection which is good news we will still waiting on the results of his lab work which unfortunately was not done prior to today's visit which is what we were aiming for. Ultimately there is no sign of active infection at this time which is good news. And he also tells me that the wound VAC has been staying in place and doing fairly well which is also good news. With that being said I am concerned about the fact that the wound has not progressed as much I think we may want to look into getting reapproval for the skin substitute, specifically PriMatrix, that I am hopeful will help to allow this area to granulate in more effectively. I would recommend as well continuing with the wound VAC along with this. 08/01/2019 on evaluation today patient appears to be doing in my opinion slightly better in regard to the wound. He actually does have some tightening of the walls around the edges which is a good thing. Fortunately there is no signs of active infection at this time. No fever chills noted. I do believe that the patient in general seems to be making progress although is very slow. Obviously were trying to get things to speed up somewhat  more. No fevers, chills, nausea, vomiting, or diarrhea. 08/08/2019 upon evaluation today patient appears to be doing well with regard to his wound although there is not a significant improvement overall we do have the PriMatrix for availability today to apply. Fortunately there is no signs of active infection and the patient overall seems to be doing well. There is really no need for aggressive sharp debridement at this point either which is also good news. I do believe her get a need to suture and the primary checks in the basin in the PriMatrix in order to keep it in place at the base of the wound underneath the wound VAC. This was discussed with the patient today as well. He is in agreement the plan. He really does not have any filling in the area and so this should not be a complication as far as suturing is concerned. 08/15/2019 on evaluation today patient appears to be doing somewhat better in my opinion in regard to the overall appearance of his wound. He has been tolerating the dressing changes without complication. Fortunately the skin subappear to still be in place when this was changed on Friday. With that being said it appears that the PriMatrix may have dissolved in the interim between now and then as the sutures are no longer holding anything in place and again I would sutured it into place. Nonetheless I believe that we can go ahead and likely consider going forward that we may need to apply the PriMatrix weekly as opposed every other week as it does seem to have dissolved rather readily and well. Overall the wound bed appears to be doing excellent at this time. 08/22/2019 upon evaluation today the patient does seem to making some progress with regard to his wound. Overall there is some better quality tissue and less of the hyper granular protrusions all of which is good news.  With that being said we do have the PriMatrix available for application today and I am in a try to find a better  way to suture this in place for him today. 08/29/2019 on evaluation today patient appears to be doing very well in regard to his wound. I do see signs of new granulation which seems to be somewhat helpful he did have some need for sharp debridement today to clear some of the hyper granulation away but nonetheless I feel like we are headed in a good direction. Fortunately there is no evidence of any systemic infection although we are going to go ahead and reorder the CBC, C-reactive protein, and sed rate today to see where things stand I did just refill his Levaquin as well. 09/05/2019 upon evaluation today patient's wound actually is not appearing to be showing signs of significant improvement compared to what we have seen previous from the standpoint of depth. He overall wound size wise seems to be about the same and again some of the quality of the wound bed is still doing fairly well. I did review his labs and his CBC was normal with no signs of elevation white blood cell count, his C-reactive protein was normal, and the sed rate was slightly elevated but again I think this may just be due to ulceration really there is nothing significant to indicate severe infection. I think when he likely finishes his current round of oral antibiotics with Levaquin will likely take him off of the medication at that point based on the Macy, [name]. ([id number]) findings. I do feel like the hyperbarics helped in getting the infection under control as well as improving the overall size of the wound quite significantly. With that being said at this point I really feel like more more at the time frame that he may benefit from a muscle flap in order to try to help this wound heal effectively. I think that this would be the fastest way in order to do this. In the past he has been somewhat resistant to going down that path but nonetheless I feel like he likely should. 09/12/2019 on evaluation today patient appears to  be doing fairly well in regard to his wound. He is tolerating the wound VAC without complication is still continues to drain significantly. Fortunately there is no signs of active infection at this time which is great news his lab work is also been doing very well. With that being said I do think at this point that the patient is still deriving benefit from the wound VAC especially in regard to controlling moisture which I think it is doing fairly well. He also has some improvement in the overall wound bed and on the medial aspect of the wound there are some hyper granular projections but nothing as severe as what they have been in the past. I am good have to perform some sharp debridement at this location today. 09/19/2019 upon evaluation today patient appears to be doing roughly the same although again the tissue does seem to be showing some signs of improvement which is good news. There is less hyper granular budding. Nonetheless he still is going require some sharp debridement today. We did get his albumin and prealbumin results back and they are within normal limits in both cases which is excellent news that was the one thing [facility] plastic surgery was waiting on order to get him scheduled. He does seem to be a good candidate physically for the flap surgery if  he decides to go that direction. With that being said is still left to be determined once they see the wound what they feel about how best to proceed. Obviously in the end it would be up to Mr. Heinlein as to whether he goes forward with the flap surgery or something else depending on the recommendations. 09/26/2019 upon evaluation today patient appears to be doing about the same in regard to his wound. Fortunately there is no signs of active infection there is also not as much hyper granulation noted today in fact I do not believe there is can be any sharp debridement required at this point. With that being said I do believe he continues to be  somewhat stalled at this point with regard to his wound. There is no signs of active infection at this time which is good news. He does have an appointment with [facility] plastic surgery on Friday. 10/03/2019 upon evaluation today patient appears to be doing somewhat poorly especially in his overall demeanor compared to last week's evaluation. He unfortunately did see the physician at [facility] plastic surgery. Unfortunately the plan for the possibility of a flap would be a fairly significant surgery that the patient is really not wanting to take the risk of as if the surgery failed he would end up with a very large open wound at that point. Fortunately there is no signs of active infection at this time. No fevers, chills, nausea, vomiting, or diarrhea. With that being said there was some question that the physician had about why this would not be healing and the possibility that there may be some bone still needs to be removed deeper in the wound. Again the bone scan that we did previous actually did show evidence of there still being a chronic osteomyelitis back on March 29, 2019. With that being said he did undergo treatment both with hyperbarics as well as oral antibiotics for quite some time since that point. Obviously he may need additional and updated imaging with the surgeon in order to see if there is anything else that can or will need to be done in order to help this area to heal. Obviously I still believe as well that there is something going on here. He is already seeing the surgeon that performed the original surgery after the bone scan but they did not feel that anything needed to be done from a surgical standpoint at that time. Objective [facility] Well-nourished and well-hydrated in no acute distress. Vitals Time Taken: 8:10 AM, Height: 73 in, Weight: 210 lbs, BMI: 27.7, Temperature: 98.3 F, Pulse: 75 bpm, Respiratory Rate: 16 breaths/min, Blood Pressure: 130/78  mmHg. Respiratory normal breathing without difficulty. Psychiatric this patient is able to make decisions and demonstrates good insight into disease process. Alert and Oriented x 3. pleasant and cooperative. General Notes: Upon inspection patient's wound does not appear to be doing poorly but he does seem to have some issues with the wound somewhat being stalled. In the tunnel region I really do not see significant improvement some of the other tissue is looking a little bit better but in general this is just a very stalled wound at this time. I do not see any obvious signs of overt infection although he continues to have a significant amount of drainage. The wound VAC is helping to manage this quite nicely. Integumentary (Hair, Skin) Wound #1 status is Open. Original cause of wound was Pressure Injury. The wound is located on the Right Gluteal fold. The wound measures  2.5cm length x 1.5cm width x 2cm depth; 2.945cm^2 area and 5.89cm^3 volume. There is Fat Layer (Subcutaneous Tissue) Exposed exposed. There is tunneling at 12:00 with a maximum distance of 3.7cm. There is a large amount of serous drainage noted. The wound margin is epibole. There is large (67-100%) pink, hyper - granulation within the wound bed. There is a small (1-33%) amount of necrotic tissue within the wound bed including Adherent Slough. 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 - in office Cleanse wound with mild soap and water Anesthetic (add to Medication List): Wound #1 Right Gluteal fold: Topical Lidocaine 4% cream applied to wound bed prior to debridement (In Clinic  Only). Skin Barriers/Peri-Wound Care: Skin Prep Primary Wound Dressing: Wound #1 Right Gluteal fold: Saline moistened gauze - in clinic only Secondary Dressing: Wound #1 Right Gluteal fold: Boardered Foam Dressing Tegaderm - in clinic only Dressing Change Frequency: Wound #1 Right Gluteal fold: Change Dressing Monday, Wednesday, Friday Follow-up Appointments: Wound #1 Right Gluteal fold: Return Appointment in 1 week. Off-Loading: Wound #1 Right Gluteal fold: Turn and reposition every 2 hours - no pressure on wounded area Home Health: Wound #1 Right Gluteal fold: Continue Home Health Visits - [place] Nurse may visit PRN to address patient s wound care needs. FACE TO FACE ENCOUNTER: MEDICARE and MEDICAID PATIENTS: I certify that this patient is under my care and that I had a face-to-face encounter that meets the physician face-to-face encounter requirements with this patient on this date. The encounter with the patient was in whole or in part for the following MEDICAL CONDITION: (primary reason for [place]) MEDICAL NECESSITY: I certify, that based on my findings, NURSING services are a medically necessary home health service. HOME BOUND STATUS: I certify that my clinical findings support that this patient is homebound (i.e., Due to illness or injury, pt requires aid of supportive devices such as crutches, cane, wheelchairs, walkers, the use of special transportation or the assistance of another person to leave their place of residence. There is a normal inability to leave the home and doing so requires considerable and taxing effort. Other absences are for medical reasons / religious services and are infrequent or of short duration when for other reasons). If current dressing causes regression in wound condition, may D/C ordered dressing product/s and apply Normal Saline Moist Dressing daily until next [place] / Other MD appointment. [place] of regression in wound condition at [phone]. Please direct any NON-WOUND related issues/requests for orders to patient's Primary Care Physician Negative Pressure Wound Therapy: Wound #1 Right Gluteal fold: Wound VAC settings at 125/130 mmHg continuous pressure. Use BLACK/GREEN foam to wound cavity. Use WHITE foam to fill any tunnel/s and/or undermining. Change VAC dressing 3 X WEEK. Change canister as indicated when full. Nurse may titrate settings and frequency of dressing changes as clinically indicated. - Apply wound vac as normal Home Health Nurse may d/c VAC for s/s of increased infection, significant wound regression, or uncontrolled drainage. [place] at [phone]. Number of foam/gauze pieces used in the dressing = - 1 Fellman, Isom J. ([id number]) 1. My suggestion at this time is good to be that we actually go ahead and continue for now  with the wound VAC. Also recommended that the patient get a copy of the bone scan to take back to plastic surgery in order for them to review and discuss what may need to be done going forward. Likely they are going to require updated imaging as well but I will leave it to them to decide what may be best as far as imaging is concerned for their purposes. The possibility of potentially removing the affected bone if indeed there is still something going on there I think would be ideal but again I am really not sure exactly what the plan would be or how involved even that would be. 2. I am get a recommend at this point that we discontinue the antibiotics he has been on this for quite some time at this point I think that it is definitely time to discontinue. With that being said depending on what is discussed that the dermatology office in 2 weeks he may potentially be a candidate to go in and try to remove the remainder of the affected bone if indeed it still showing signs of that after he has updated imaging and  then go from there as far as treatment is concerned. A wound VAC would obviously probably be appropriate at that point as well. We will see patient back for reevaluation in 1 week here in the clinic. If anything worsens or changes patient will contact our office for additional recommendations. Electronic Signature(s) Signed: 10/03/2019 9:32:35 AM By: Worthy Keeler [name] Entered By: Worthy Keeler on 10/03/2019 09:32:35 ZAKAI, MILES ([id number]) -------------------------------------------------------------------------------- SuperBill Details Patient Name: [name] Date of Service: 10/03/2019 Medical Record Number: [id number] Patient Account Number: [id number] Date of Birth/Sex: 12-24-1950 (69 y.o. 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, sequela I10 Essential (primary) hypertension Facility Procedures CPT4 Code: [id number] Description: [id number] - WOUND CARE VISIT-LEV 2 EST PT Modifier: Quantity: 1 Physician Procedures CPT4 [id number] Description: [id number] - WC PHYS LEVEL 4 - EST PT Modifier: Quantity: 1 CPT4 Code: Description: ICD-10 Diagnosis Description L89.314 Pressure ulcer of right buttock, stage 4 M86.68 Other chronic osteomyelitis, other site L03.317 Cellulitis of buttock G82.20 Paraplegia, unspecified Modifier: Quantity: Electronic Signature(s) Signed: 10/03/2019 9:32:51 AM By: Worthy Keeler [name] Entered By: Worthy Keeler on 10/03/2019 09:32:50

## 2019-10-03 NOTE — Progress Notes (Signed)
MCCORMICK, RACHLIN ([id number]) Visit Report for 10/03/2019 Arrival Information Details Patient Name: [name], [name] Date of Service: 10/03/2019 8:00 AM Medical Record Number: [id number] Patient Account Number: [id number] Date of Birth/Sex: [birth year] (68 y.o. M) Treating RN: Primary Care Gottlieb Zuercher: [name] Other Clinician: Referring Woodson Macha: [name] Treating Valori Hollenkamp/Extender: [name], HOYT Weeks in Treatment: 96 Visit Information History Since Last Visit Added or deleted any medications: No Patient Arrived: Wheel Chair Any new allergies or adverse reactions: No Arrival Time: 08:08 Had a fall or experienced change in No Accompanied By: wife activities of daily living that may affect Transfer Assistance: None risk of falls: Patient Identification Verified: Yes Signs or symptoms of abuse/neglect since last visito No Secondary Verification Process Completed: Yes Hospitalized since last visit: No Patient Requires Transmission-Based Precautions: No Implantable device outside of the clinic excluding No Patient Has Alerts: Yes cellular tissue based products placed in the center Patient Alerts: NOT Diabetic since last visit: Has Dressing in Place as Prescribed: Yes Pain Present Now: Yes Electronic Signature(s) Signed: 10/03/2019 4:23:57 PM By: Lorine Bears RCP, RRT, CHT Entered By: Lorine Bears on 10/03/2019 08:12:39 [name] ([id number]) -------------------------------------------------------------------------------- Clinic Level of Care Assessment Details Patient Name: [name] Date of Service: 10/03/2019 8:00 AM Medical Record Number: [id number] Patient Account Number: [id number] Date of Birth/Sex: [birth year] (68 y.o. M) Treating RN: [name] Primary Care Carmellia Kreisler: [name] Other Clinician: Referring Arva Slaugh: [name] Treating Fannie Alomar/Extender: [name], HOYT Weeks in Treatment:  96 Clinic Level of Care Assessment Items TOOL 4 Quantity Score []  - Use when only an EandM is performed on FOLLOW-UP visit 0 ASSESSMENTS - Nursing Assessment / Reassessment []  - Reassessment of Co-morbidities (includes updates in patient status) 0 X- 1 5 Reassessment of Adherence to Treatment Plan ASSESSMENTS - Wound and Skin Assessment / Reassessment X - Simple Wound Assessment / Reassessment - one wound 1 5 []  - 0 Complex Wound Assessment / Reassessment - multiple wounds []  - 0 Dermatologic / Skin Assessment (not related to wound area) ASSESSMENTS - Focused Assessment []  - Circumferential Edema Measurements - multi extremities 0 []  - 0 Nutritional Assessment / Counseling / Intervention []  - 0 Lower Extremity Assessment (monofilament, tuning fork, pulses) []  - 0 Peripheral Arterial Disease Assessment (using hand held doppler) ASSESSMENTS - Ostomy and/or Continence Assessment and Care []  - Incontinence Assessment and Management 0 []  - 0 Ostomy Care Assessment and Management (repouching, etc.) PROCESS - Coordination of Care X - Simple Patient / Family Education for ongoing care 1 15 []  - 0 Complex (extensive) Patient / Family Education for ongoing care []  - 0 Staff obtains Programmer, systems, Records, Test Results / Process Orders []  - 0 Staff telephones HHA, Nursing Homes / Clarify orders / etc []  - 0 Routine Transfer to another Facility (non-emergent condition) []  - 0 Routine Hospital Admission (non-emergent condition) []  - 0 New Admissions / Biomedical engineer / Ordering NPWT, Apligraf, etc. []  - 0 Emergency Hospital Admission (emergent condition) X- 1 10 Simple Discharge Coordination []  - 0 Complex (extensive) Discharge Coordination PROCESS - Special Needs []  - Pediatric / Minor Patient Management 0 []  - 0 Isolation Patient Management []  - 0 Hearing / Language / Visual special needs []  - 0 Assessment of Community assistance (transportation, D/C planning,  etc.) [name], [name]. ([id number]) []  - 0 Additional assistance / Altered mentation []  - 0 Support Surface(s) Assessment (bed, cushion, seat, etc.) INTERVENTIONS - Wound Cleansing / Measurement X - Simple Wound Cleansing - one wound  1 5 []  - 0 Complex Wound Cleansing - multiple wounds X- 1 5 Wound Imaging (photographs - any number of wounds) []  - 0 Wound Tracing (instead of photographs) X- 1 5 Simple Wound Measurement - one wound []  - 0 Complex Wound Measurement - multiple wounds INTERVENTIONS - Wound Dressings []  - Small Wound Dressing one or multiple wounds 0 []  - 0 Medium Wound Dressing one or multiple wounds X- 1 20 Large Wound Dressing one or multiple wounds []  - 0 Application of Medications - topical []  - 0 Application of Medications - injection INTERVENTIONS - Miscellaneous []  - External ear exam 0 []  - 0 Specimen Collection (cultures, biopsies, blood, body fluids, etc.) []  - 0 Specimen(s) / Culture(s) sent or taken to Lab for analysis []  - 0 Patient Transfer (multiple staff / Civil Service fast streamer / Similar devices) []  - 0 Simple Staple / Suture removal (25 or less) []  - 0 Complex Staple / Suture removal (26 or more) []  - 0 Hypo / Hyperglycemic Management (close monitor of Blood Glucose) []  - 0 Ankle / Brachial Index (ABI) - do not check if billed separately X- 1 5 Vital Signs Has the patient been seen at the hospital within the last three years: Yes Total Score: 75 Level Of Care: New/Established - Level 2 Electronic Signature(s) Signed: 10/03/2019 4:45:00 PM By: Gretta Cool, BSN, RN, CWS, Kim RN, BSN Entered By: Gretta Cool, BSN, RN, CWS, Kim on 10/03/2019 08:41:03 [name], [name] ([id number]) -------------------------------------------------------------------------------- Encounter Discharge Information Details Patient Name: [name] Date of Service: 10/03/2019 8:00 AM Medical Record Number: [id number] Patient Account Number: [id number] Date of Birth/Sex:  April 20, 1951 (68 y.o. M) Treating RN: [name] Primary Care Isadore Bokhari: [name] Other Clinician: Referring Alexee Delsanto: [name] Treating Mikena Masoner/Extender: [name], HOYT Weeks in Treatment: 63 Encounter Discharge Information Items Discharge Condition: Stable Ambulatory Status: Ambulatory Discharge Destination: Home Transportation: Private Auto Accompanied By: self Schedule Follow-up Appointment: Yes Clinical Summary of Care: Electronic Signature(s) Signed: 10/03/2019 4:45:00 PM By: Gretta Cool, BSN, RN, CWS, Kim RN, BSN Entered By: Gretta Cool, BSN, RN, CWS, Kim on 10/03/2019 08:42:24 [name], [name] ([id number]) -------------------------------------------------------------------------------- Lower Extremity Assessment Details Patient Name: [name] Date of Service: 10/03/2019 8:00 AM Medical Record Number: [id number] Patient Account Number: [id number] Date of Birth/Sex: 1950/10/25 (68 y.o. M) Treating RN: [name] Primary Care Sricharan Lacomb: [name] Other Clinician: Referring Marcia Hartwell: [name] Treating Kanai Berrios/Extender: Worthy Keeler Weeks in Treatment: 69 Electronic Signature(s) Signed: 10/03/2019 4:45:00 PM By: Gretta Cool, BSN, RN, CWS, Kim RN, BSN Entered By: Gretta Cool, BSN, RN, CWS, Kim on 10/03/2019 08:23:55 [name], [name] ([id number]) -------------------------------------------------------------------------------- Multi Wound Chart Details Patient Name: [name] Date of Service: 10/03/2019 8:00 AM Medical Record Number: [id number] Patient Account Number: [id number] Date of Birth/Sex: 19-Apr-1951 (68 y.o. M) Treating RN: [name] Primary Care Taylan Marez: [name] Other Clinician: Referring Iver Miklas: [name] Treating Shayda Kalka/Extender: [name], HOYT Weeks in Treatment: 96 Vital Signs Height(in): 73 Pulse(bpm): 75 Weight(lbs): 210 Blood Pressure(mmHg): 130/78 Body Mass Index(BMI): 28 Temperature(F):  98.3 Respiratory Rate(breaths/min): 16 Photos: [N/A:N/A] Wound Location: Right Gluteal fold N/A N/A Wounding Event: Pressure Injury N/A N/A Primary Etiology: Pressure Ulcer N/A N/A Comorbid History: Cataracts, Hypertension, Paraplegia N/A N/A Date Acquired: 11/02/2017 N/A N/A Weeks of Treatment: 96 N/A N/A Wound Status: Open N/A N/A Measurements L x W x D (cm) 2.5x1.5x2 N/A N/A Area (cm) : 2.945 N/A N/A Volume (cm) : 5.89 N/A N/A % Reduction in Area: 70.70% N/A N/A % Reduction in Volume: -486.10% N/A N/A  Position 1 (o'clock): 12 Maximum Distance 1 (cm): 3.7 Tunneling: Yes N/A N/A Classification: Category/Stage IV N/A N/A Exudate Amount: Large N/A N/A Exudate Type: Serous N/A N/A Exudate Color: amber N/A N/A Wound Margin: Epibole N/A N/A Granulation Amount: Large (67-100%) N/A N/A Granulation Quality: Pink, Hyper-granulation N/A N/A Necrotic Amount: Small (1-33%) N/A N/A Exposed Structures: Fat Layer (Subcutaneous Tissue) N/A N/A Exposed: Yes Fascia: No Tendon: No Muscle: No Joint: No Bone: No Epithelialization: Small (1-33%) N/A N/A Treatment Notes Electronic Signature(s) Signed: 10/03/2019 4:45:00 PM By: Gretta Cool, BSN, RN, CWS, Kim RN, BSN [address] [place] ([id number]) Entered By: Gretta Cool, BSN, RN, CWS, Kim on 10/03/2019 08:32:46 [name], [name] ([id number]) -------------------------------------------------------------------------------- Kidron Details Patient Name: [name] Date of Service: 10/03/2019 8:00 AM Medical Record Number: [id number] Patient Account Number: [id number] Date of Birth/Sex: May 05, 1951 (68 y.o. M) Treating RN: [name] Primary Care Tashayla Therien: [name] Other Clinician: Referring Derrill Bagnell: [name] Treating Rinoa Garramone/Extender: [name], HOYT Weeks in Treatment: 96 Active Inactive Osteomyelitis Nursing Diagnoses: Infection: osteomyelitis Goals: Patient's osteomyelitis will resolve Date  Initiated: 08/29/2019 Target Resolution Date: 09/28/2019 Goal Status: Active Signs and symptoms for osteomyelitis will be recognized and promptly addressed Date Initiated: 08/29/2019 Target Resolution Date: 09/28/2019 Goal Status: Active Interventions: Assess for signs and symptoms of osteomyelitis resolution every visit Treatment Activities: Systemic antibiotics : 08/29/2019 Notes: Pressure Nursing Diagnoses: Knowledge deficit related to management of pressures ulcers Goals: Patient/caregiver will verbalize understanding of pressure ulcer management Date Initiated: 05/17/2018 Target Resolution Date: 05/28/2018 Goal Status: Active Interventions: Assess: immobility, friction, shearing, incontinence upon admission and as needed Notes: Wound/Skin Impairment Nursing Diagnoses: Impaired tissue integrity Goals: Patient/caregiver will verbalize understanding of skin care regimen Date Initiated: 11/30/2017 Target Resolution Date: 12/21/2017 Goal Status: Active Ulcer/skin breakdown will have a volume reduction of 30% by week 4 Date Initiated: 11/30/2017 Target Resolution Date: 12/21/2017 Goal Status: Active Interventions: [name], [name] ([id number]) Assess patient/caregiver ability to obtain necessary supplies Assess patient/caregiver ability to perform ulcer/skin care regimen upon admission and as needed Assess ulceration(s) every visit Treatment Activities: Skin care regimen initiated : 11/30/2017 Notes: Electronic Signature(s) Signed: 10/03/2019 4:45:00 PM By: Gretta Cool, BSN, RN, CWS, Kim RN, BSN Entered By: Gretta Cool, BSN, RN, CWS, Kim on 10/03/2019 08:32:38 [name], [name] ([id number]) -------------------------------------------------------------------------------- Pain Assessment Details Patient Name: [name] Date of Service: 10/03/2019 8:00 AM Medical Record Number: [id number] Patient Account Number: [id number] Date of Birth/Sex: 04-12-1951 (68 y.o. M) Treating RN: [name] Primary Care Peachie Barkalow: [name] Other Clinician: Referring Tauheed Mcfayden: [name] Treating Jahlen Bollman/Extender: [name], HOYT Weeks in Treatment: 96 Active Problems Location of Pain Severity and Description of Pain Patient Has Paino No Site Locations Pain Management and Medication Current Pain Management: Notes hurts when sitting Electronic Signature(s) Signed: 10/03/2019 4:45:00 PM By: Gretta Cool, BSN, RN, CWS, Kim RN, BSN Entered By: Gretta Cool, BSN, RN, CWS, Kim on 10/03/2019 08:21:33 [name], [name] ([id number]) -------------------------------------------------------------------------------- Patient/Caregiver Education Details Patient Name: [name] Date of Service: 10/03/2019 8:00 AM Medical Record Number: [id number] Patient Account Number: [id number] Date of Birth/Gender: May 29, 1951 (68 y.o. M) Treating RN: [name] Primary Care Physician: [name] Other Clinician: Referring Physician: [name] Treating Physician/Extender: Sharalyn Ink in Treatment: 78 Education Assessment Education Provided To: Patient Education Topics Provided Pressure: Handouts: Pressure Ulcers: Care and Offloading, Other: stay off of wounded area Methods: Demonstration, Explain/Verbal Responses: State content correctly Wound/Skin Impairment: Handouts: Caring for Your Ulcer Methods: Demonstration, Explain/Verbal Responses: State content correctly Electronic Signature(s) Signed: 10/03/2019 4:45:00  PM By: Gretta Cool, BSN, RN, CWS, Kim RN, BSN Entered By: Gretta Cool, BSN, RN, CWS, Kim on 10/03/2019 08:41:38 [name], [name] ([id number]) -------------------------------------------------------------------------------- Wound Assessment Details Patient Name: [name] Date of Service: 10/03/2019 8:00 AM Medical Record Number: [id number] Patient Account Number: [id number] Date of Birth/Sex: [birth year] (68 y.o. M) Treating RN: [name] Primary Care  Earlie Schank: [name] Other Clinician: Referring Yazleen Molock: [name] Treating Dorrien Grunder/Extender: [name], HOYT Weeks in Treatment: 96 Wound Status Wound Number: 1 Primary Etiology: Pressure Ulcer Wound Location: Right Gluteal fold Wound Status: Open Wounding Event: Pressure Injury Comorbid History: Cataracts, Hypertension, Paraplegia Date Acquired: 11/02/2017 Weeks Of Treatment: 96 Clustered Wound: No Photos Wound Measurements Length: (cm) 2.5 Width: (cm) 1.5 Depth: (cm) 2 Area: (cm) 2.945 Volume: (cm) 5.89 % Reduction in Area: 70.7% % Reduction in Volume: -486.1% Epithelialization: Small (1-33%) Tunneling: Yes Position (o'clock): 12 Maximum Distance: (cm) 3.7 Wound Description Classification: Category/Stage IV Wound Margin: Epibole Exudate Amount: Large Exudate Type: Serous Exudate Color: amber Foul Odor After Cleansing: No Slough/Fibrino Yes Wound Bed Granulation Amount: Large (67-100%) Exposed Structure Granulation Quality: Pink, Hyper-granulation Fascia Exposed: No Necrotic Amount: Small (1-33%) Fat Layer (Subcutaneous Tissue) Exposed: Yes Necrotic Quality: Adherent Slough Tendon Exposed: No Muscle Exposed: No Joint Exposed: No Bone Exposed: No Treatment Notes Wound #1 (Right Gluteal fold) Notes Saline moistened gauze, Bordered foam dressing, HH to replace wound vac later today. [name], [name] ([id number]) Electronic Signature(s) Signed: 10/03/2019 4:45:00 PM By: Gretta Cool, BSN, RN, CWS, Kim RN, BSN Entered By: Gretta Cool, BSN, RN, CWS, Kim on 10/03/2019 08:24:41 MOSIE, SILBERBERG ([id number]) -------------------------------------------------------------------------------- Vitals Details Patient Name: [name] Date of Service: 10/03/2019 8:00 AM Medical Record Number: [id number] Patient Account Number: [id number] Date of Birth/Sex: 01/22/1951 (68 y.o. M) Treating RN: Primary Care Teva Bronkema: [name] Other  Clinician: Referring Codie Krogh: [name] Treating Shantale Holtmeyer/Extender: [name], HOYT Weeks in Treatment: 96 Vital Signs Time Taken: 08:10 Temperature (F): 98.3 Height (in): 73 Pulse (bpm): 75 Weight (lbs): 210 Respiratory Rate (breaths/min): 16 Body Mass Index (BMI): 27.7 Blood Pressure (mmHg): 130/78 Reference Range: 80 - 120 mg / dl Electronic Signature(s) Signed: 10/03/2019 4:23:57 PM By: Lorine Bears RCP, RRT, CHT Entered By: Lorine Bears on 10/03/2019 08:13:11

## 2019-10-10 ENCOUNTER — Encounter: Payer: Medicare Other | Admitting: Physician Assistant

## 2019-10-10 ENCOUNTER — Other Ambulatory Visit: Payer: Self-pay

## 2019-10-10 DIAGNOSIS — L89314 Pressure ulcer of right buttock, stage 4: Secondary | ICD-10-CM | POA: Diagnosis not present

## 2019-10-10 NOTE — Progress Notes (Signed)
JERAL, BYINGTON ([id number]) Visit Report for 10/10/2019 Arrival Information Details Patient Name: [name], [name] Date of Service: 10/10/2019 8:00 AM Medical Record Number: [id number] Patient Account Number: [id number] Date of Birth/Sex: Nov 08, 1950 (68 y.o. M) Treating RN: Montey Hora Primary Care Emer Onnen: [name] Other Clinician: Referring Carla Whilden: [name] Treating Ronique Simerly/Extender: [name], HOYT Weeks in Treatment: 24 Visit Information History Since Last Visit Added or deleted any medications: No Patient Arrived: Wheel Chair Any new allergies or adverse reactions: No Arrival Time: 08:13 Had a fall or experienced change in No Accompanied By: wife activities of daily living that may affect Transfer Assistance: None risk of falls: Patient Identification Verified: Yes Signs or symptoms of abuse/neglect since last visito No Secondary Verification Process Completed: Yes Hospitalized since last visit: No Patient Requires Transmission-Based Precautions: No Implantable device outside of the clinic excluding No Patient Has Alerts: Yes cellular tissue based products placed in the center Patient Alerts: NOT Diabetic since last visit: Has Dressing in Place as Prescribed: Yes Pain Present Now: No Electronic Signature(s) Signed: 10/10/2019 4:17:09 PM By: Montey Hora Entered By: Montey Hora on 10/10/2019 08:14:59 [name] ([id number]) -------------------------------------------------------------------------------- Clinic Level of Care Assessment Details Patient Name: [name] Date of Service: 10/10/2019 8:00 AM Medical Record Number: [id number] Patient Account Number: [id number] Date of Birth/Sex: 25-Feb-1951 (68 y.o. M) Treating RN: Army Melia Primary Care Apostolos Blagg: [name] Other Clinician: Referring Sabeen Piechocki: [name] Treating Haizlee Henton/Extender: [name], HOYT Weeks in Treatment: 40 Clinic Level of Care  Assessment Items TOOL 4 Quantity Score []  - Use when only an EandM is performed on FOLLOW-UP visit 0 ASSESSMENTS - Nursing Assessment / Reassessment X - Reassessment of Co-morbidities (includes updates in patient status) 1 10 X- 1 5 Reassessment of Adherence to Treatment Plan ASSESSMENTS - Wound and Skin Assessment / Reassessment X - Simple Wound Assessment / Reassessment - one wound 1 5 []  - 0 Complex Wound Assessment / Reassessment - multiple wounds []  - 0 Dermatologic / Skin Assessment (not related to wound area) ASSESSMENTS - Focused Assessment []  - Circumferential Edema Measurements - multi extremities 0 []  - 0 Nutritional Assessment / Counseling / Intervention []  - 0 Lower Extremity Assessment (monofilament, tuning fork, pulses) []  - 0 Peripheral Arterial Disease Assessment (using hand held doppler) ASSESSMENTS - Ostomy and/or Continence Assessment and Care []  - Incontinence Assessment and Management 0 []  - 0 Ostomy Care Assessment and Management (repouching, etc.) PROCESS - Coordination of Care X - Simple Patient / Family Education for ongoing care 1 15 []  - 0 Complex (extensive) Patient / Family Education for ongoing care X- 1 10 Staff obtains Programmer, systems, Records, Test Results / Process Orders []  - 0 Staff telephones HHA, Nursing Homes / Clarify orders / etc []  - 0 Routine Transfer to another Facility (non-emergent condition) []  - 0 Routine Hospital Admission (non-emergent condition) []  - 0 New Admissions / Biomedical engineer / Ordering NPWT, Apligraf, etc. []  - 0 Emergency Hospital Admission (emergent condition) X- 1 10 Simple Discharge Coordination []  - 0 Complex (extensive) Discharge Coordination PROCESS - Special Needs []  - Pediatric / Minor Patient Management 0 []  - 0 Isolation Patient Management []  - 0 Hearing / Language / Visual special needs []  - 0 Assessment of Community assistance (transportation, D/C planning, etc.) BARETTA, ZIPKIN.  ([id number]) []  - 0 Additional assistance / Altered mentation []  - 0 Support Surface(s) Assessment (bed, cushion, seat, etc.) INTERVENTIONS - Wound Cleansing / Measurement X - Simple Wound Cleansing - one wound 1 5 []  -  0 Complex Wound Cleansing - multiple wounds X- 1 5 Wound Imaging (photographs - any number of wounds) []  - 0 Wound Tracing (instead of photographs) X- 1 5 Simple Wound Measurement - one wound []  - 0 Complex Wound Measurement - multiple wounds INTERVENTIONS - Wound Dressings []  - Small Wound Dressing one or multiple wounds 0 X- 1 15 Medium Wound Dressing one or multiple wounds []  - 0 Large Wound Dressing one or multiple wounds []  - 0 Application of Medications - topical []  - 0 Application of Medications - injection INTERVENTIONS - Miscellaneous []  - External ear exam 0 []  - 0 Specimen Collection (cultures, biopsies, blood, body fluids, etc.) []  - 0 Specimen(s) / Culture(s) sent or taken to Lab for analysis []  - 0 Patient Transfer (multiple staff / Civil Service fast streamer / Similar devices) []  - 0 Simple Staple / Suture removal (25 or less) []  - 0 Complex Staple / Suture removal (26 or more) []  - 0 Hypo / Hyperglycemic Management (close monitor of Blood Glucose) []  - 0 Ankle / Brachial Index (ABI) - do not check if billed separately X- 1 5 Vital Signs Has the patient been seen at the hospital within the last three years: Yes Total Score: 90 Level Of Care: New/Established - Level 3 Electronic Signature(s) Signed: 10/10/2019 4:14:01 PM By: Army Melia Entered By: Army Melia on 10/10/2019 08:42:56 Reiley, [name] -------------------------------------------------------------------------------- Encounter Discharge Information Details Patient Name: [name] Date of Service: 10/10/2019 8:00 AM Medical Record Number: [id number] Patient Account Number: [id number] Date of Birth/Sex: 1951/05/17 (68 y.o. M) Treating RN: Army Melia Primary  Care Rosebud Koenen: [name] Other Clinician: Referring Pawel Soules: [name] Treating Melbourne Jakubiak/Extender: [name], HOYT Weeks in Treatment: 30 Encounter Discharge Information Items Discharge Condition: Stable Ambulatory Status: Wheelchair Discharge Destination: Home Transportation: Private Auto Accompanied By: wife Schedule Follow-up Appointment: Yes Clinical Summary of Care: Electronic Signature(s) Signed: 10/10/2019 4:14:01 PM By: Army Melia Entered By: Army Melia on 10/10/2019 08:44:48 AHNAF, GORMAN ([id number]) -------------------------------------------------------------------------------- Lower Extremity Assessment Details Patient Name: [name] Date of Service: 10/10/2019 8:00 AM Medical Record Number: [id number] Patient Account Number: [id number] Date of Birth/Sex: 09-22-1950 (68 y.o. M) Treating RN: Montey Hora Primary Care Marlin Brys: [name] Other Clinician: Referring Lanai Conlee: [name] Treating Olamide Carattini/Extender: [name], HOYT Weeks in Treatment: 65 Electronic Signature(s) Signed: 10/10/2019 4:17:09 PM By: Montey Hora Entered By: Montey Hora on 10/10/2019 08:15:19 HORTON, RHODES ([id number]) -------------------------------------------------------------------------------- Multi Wound Chart Details Patient Name: [name] Date of Service: 10/10/2019 8:00 AM Medical Record Number: [id number] Patient Account Number: [id number] Date of Birth/Sex: Oct 23, 1950 (68 y.o. M) Treating RN: Army Melia Primary Care Nonna Renninger: [name] Other Clinician: Referring Deleon Passe: [name] Treating Delio Slates/Extender: [name], HOYT Weeks in Treatment: 97 Vital Signs Height(in): 73 Pulse(bpm): 45 Weight(lbs): 210 Blood Pressure(mmHg): 139/80 Body Mass Index(BMI): 28 Temperature(F): 98.1 Respiratory Rate(breaths/min): 16 Photos: [N/A:N/A] Wound Location: Right Gluteal fold N/A N/A Wounding  Event: Pressure Injury N/A N/A Primary Etiology: Pressure Ulcer N/A N/A Comorbid History: Cataracts, Hypertension, Paraplegia N/A N/A Date Acquired: 11/02/2017 N/A N/A Weeks of Treatment: 97 N/A N/A Wound Status: Open N/A N/A Measurements L x W x D (cm) 3.2x1.9x2.7 N/A N/A Area (cm) : 4.775 N/A N/A Volume (cm) : 12.893 N/A N/A % Reduction in Area: 52.50% N/A N/A % Reduction in Volume: -1182.90% N/A N/A Position 1 (o'clock): 12 Maximum Distance 1 (cm): 3.8 Tunneling: Yes N/A N/A Classification: Category/Stage IV N/A N/A Exudate Amount: Large N/A N/A Exudate Type: Serous N/A N/A  Exudate Color: amber N/A N/A Wound Margin: Epibole N/A N/A Granulation Amount: Large (67-100%) N/A N/A Granulation Quality: Pink, Hyper-granulation N/A N/A Necrotic Amount: Small (1-33%) N/A N/A Exposed Structures: Fat Layer (Subcutaneous Tissue) N/A N/A Exposed: Yes Fascia: No Tendon: No Muscle: No Joint: No Bone: No Epithelialization: Small (1-33%) N/A N/A Treatment Notes Electronic Signature(s) Signed: 10/10/2019 4:14:01 PM By: Thornell Sartorius, [name] ([id number]) Entered By: Army Melia on 10/10/2019 08:32:42 ALIAS, GRAHN ([id number]) -------------------------------------------------------------------------------- [place] Details Patient Name: [name] Date of Service: 10/10/2019 8:00 AM Medical Record Number: [id number] Patient Account Number: [id number] Date of Birth/Sex: Aug 04, 1950 (68 y.o. M) Treating RN: Army Melia Primary Care Keyatta Tolles: [name] Other Clinician: Referring Deldrick Linch: [name] Treating Mckinley Adelstein/Extender: [name], HOYT Weeks in Treatment: 98 Active Inactive Osteomyelitis Nursing Diagnoses: Infection: osteomyelitis Goals: Patient's osteomyelitis will resolve Date Initiated: 08/29/2019 Target Resolution Date: 09/28/2019 Goal Status: Active Signs and symptoms for osteomyelitis will be recognized and  promptly addressed Date Initiated: 08/29/2019 Target Resolution Date: 09/28/2019 Goal Status: Active Interventions: Assess for signs and symptoms of osteomyelitis resolution every visit Treatment Activities: Systemic antibiotics : 08/29/2019 Notes: Pressure Nursing Diagnoses: Knowledge deficit related to management of pressures ulcers Goals: Patient/caregiver will verbalize understanding of pressure ulcer management Date Initiated: 05/17/2018 Target Resolution Date: 05/28/2018 Goal Status: Active Interventions: Assess: immobility, friction, shearing, incontinence upon admission and as needed Notes: Wound/Skin Impairment Nursing Diagnoses: Impaired tissue integrity Goals: Patient/caregiver will verbalize understanding of skin care regimen Date Initiated: 11/30/2017 Target Resolution Date: 12/21/2017 Goal Status: Active Ulcer/skin breakdown will have a volume reduction of 30% by week 4 Date Initiated: 11/30/2017 Target Resolution Date: 12/21/2017 Goal Status: Active Interventions: STRAN, BECTON ([id number]) Assess patient/caregiver ability to obtain necessary supplies Assess patient/caregiver ability to perform ulcer/skin care regimen upon admission and as needed Assess ulceration(s) every visit Treatment Activities: Skin care regimen initiated : 11/30/2017 Notes: Electronic Signature(s) Signed: 10/10/2019 4:14:01 PM By: Army Melia Entered By: Army Melia on 10/10/2019 08:32:27 SAMUELA, DERINGER ([id number]) -------------------------------------------------------------------------------- Pain Assessment Details Patient Name: [name] Date of Service: 10/10/2019 8:00 AM Medical Record Number: [id number] Patient Account Number: [id number] Date of Birth/Sex: 07/13/1951 (68 y.o. M) Treating RN: Montey Hora Primary Care Ronrico Dupin: [name] Other Clinician: Referring Beulah Capobianco: [name] Treating Azaylia Fong/Extender: [name], HOYT Weeks in  Treatment: 51 Active Problems Location of Pain Severity and Description of Pain Patient Has Paino Yes Site Locations Pain Location: Pain in Ulcers With Dressing Change: Yes Duration of the Pain. Constant / Intermittento Intermittent Pain Management and Medication Current Pain Management: Notes hurts when sitting Electronic Signature(s) Signed: 10/10/2019 4:17:09 PM By: Montey Hora Entered By: Montey Hora on 10/10/2019 08:15:45 TREYSHON, TIMLIN ([id number]) -------------------------------------------------------------------------------- Patient/Caregiver Education Details Patient Name: [name] Date of Service: 10/10/2019 8:00 AM Medical Record Number: [id number] Patient Account Number: [id number] Date of Birth/Gender: Dec 18, 1950 (68 y.o. M) Treating RN: Army Melia Primary Care Physician: [name] Other Clinician: Referring Physician: [name] Treating Physician/Extender: Sharalyn Ink in Treatment: 64 Education Assessment Education Provided To: Patient Education Topics Provided Wound/Skin Impairment: Handouts: Caring for Your Ulcer Methods: Demonstration, Explain/Verbal Responses: State content correctly Electronic Signature(s) Signed: 10/10/2019 4:14:01 PM By: Army Melia Entered By: Army Melia on 10/10/2019 08:43:55 Buege, [name] ([id number]) -------------------------------------------------------------------------------- Wound Assessment Details Patient Name: [name] Date of Service: 10/10/2019 8:00 AM Medical Record Number: [id number] Patient Account Number: [id number] Date of Birth/Sex: April 15, 1951 (68 y.o. M) Treating RN: Montey Hora Primary Care Saryah Loper: [name] Other  Clinician: Referring Zhaire Locker: [name] Treating Maxx Pham/Extender: [name], HOYT Weeks in Treatment: 97 Wound Status Wound Number: 1 Primary Etiology: Pressure Ulcer Wound Location: Right Gluteal  fold Wound Status: Open Wounding Event: Pressure Injury Comorbid History: Cataracts, Hypertension, Paraplegia Date Acquired: 11/02/2017 Weeks Of Treatment: 97 Clustered Wound: No Photos Wound Measurements Length: (cm) 3.2 % R Width: (cm) 1.9 % R Depth: (cm) 2.7 Epi Area: (cm) 4.775 Tu Volume: (cm) 12.893 eduction in Area: 52.5% eduction in Volume: -1182.9% thelialization: Small (1-33%) nneling: Yes Position (o'clock): 12 Maximum Distance: (cm) 3.8 Undermining: No Wound Description Classification: Category/Stage IV Fou Wound Margin: Epibole Slo Exudate Amount: Large Exudate Type: Serous Exudate Color: amber l Odor After Cleansing: No ugh/Fibrino Yes Wound Bed Granulation Amount: Large (67-100%) Exposed Structure Granulation Quality: Pink, Hyper-granulation Fascia Exposed: No Necrotic Amount: Small (1-33%) Fat Layer (Subcutaneous Tissue) Exposed: Yes Necrotic Quality: Adherent Slough Tendon Exposed: No Muscle Exposed: No Joint Exposed: No Bone Exposed: No Treatment Notes Wound #1 (Right Gluteal fold) Notes S.cell, xsorb, tegaderm ORBIN, BEITEL ([id number]) Electronic Signature(s) Signed: 10/10/2019 4:17:09 PM By: Montey Hora Entered By: Montey Hora on 10/10/2019 08:24:14 [name] ([id number]) -------------------------------------------------------------------------------- Vitals Details Patient Name: [name] Date of Service: 10/10/2019 8:00 AM Medical Record Number: [id number] Patient Account Number: [id number] Date of Birth/Sex: January 28, 1951 (68 y.o. M) Treating RN: Montey Hora Primary Care Kristol Almanzar: [name] Other Clinician: Referring Dyon Rotert: [name] Treating Tashiana Lamarca/Extender: [name], HOYT Weeks in Treatment: 97 Vital Signs Time Taken: 08:16 Temperature (F): 98.1 Height (in): 73 Pulse (bpm): 67 Weight (lbs): 210 Respiratory Rate (breaths/min): 16 Body Mass Index (BMI): 27.7 Blood Pressure  (mmHg): 139/80 Reference Range: 80 - 120 mg / dl Electronic Signature(s) Signed: 10/10/2019 4:17:09 PM By: Montey Hora Entered By: Montey Hora on 10/10/2019 08:17:08

## 2019-10-10 NOTE — Progress Notes (Signed)
STANDFORD, SNOWBALL ([id number]) Visit Report for 10/10/2019 Chief Complaint Document Details Patient Name: RANARD, STOCKSTILL Date of Service: 10/10/2019 8:00 AM Medical Record Number: [id number] Patient Account Number: [id number] Date of Birth/Sex: 01-28-1951 (68 y.o. M) Treating RN: Army Melia Primary Care Provider: [name] Other Clinician: Referring Provider: [name] Treating Provider/Extender: [name], Harlan Ervine Weeks in Treatment: 29 Information Obtained from: Patient Chief Complaint Right gluteal fold ulcer Electronic Signature(s) Signed: 10/10/2019 4:32:11 PM By: Worthy Keeler [name] Entered By: Worthy Keeler on 10/10/2019 08:16:45 MINA, METCALF ([id number]) -------------------------------------------------------------------------------- HPI Details Patient Name: [name] Date of Service: 10/10/2019 8:00 AM Medical Record Number: [id number] Patient Account Number: [id number] Date of Birth/Sex: 06/02/1951 (68 y.o. 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 He states that there is no history of diabetes that he is aware of. He does have issues with his prostate and is currently receiving treatment for this by way of oral medication. With that being said I do not have a lot of details in that regard. Nonetheless the patient presents today as a result of having been referred to [place] by another provider initially home health was set to come out and take care of his wound although due to the fact that he  apparently drives he's not able to receive home health. His wife is therefore trying to help take care of this wound within although they have been struggling with what exactly to do at this point. She states that she can do some things but she is definitely not a nurse and does have some issues with looking at blood. The good news is the wound does not appear to be too deep and is fairly superficial at this point. There is no slough noted there is some nonviable skin noted around the surface of the wound and the perimeter at this point. The central portion of the wound appears to be very good with a dermal layer noted this does not appear to be again deep enough to extend it to subcutaneous tissue at this point. 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 In the pump he has Dilaudid and baclofen. 12/07/17 on evaluation today patient presents for follow-up concerning his ongoing lower back thigh ulcer on the right. He states that he did not get the supplies ordered and therefore has not really been able to perform the dressing changes as directed exactly. His wife was able to get some Boarder Foam Dressing's from the drugstore and subsequently has been using hydrogel which did help to a degree in the wound does appear to be able smaller. There is actually more drainage this week noted than previous. 12/21/17 on evaluation today patient appears to be doing rather well in regard to his right gluteal ulcer. He has been tolerating the dressing changes without complication. There does not appear to be any evidence of infection at this point in time. Overall the wound does seem to be making some progress as far as the edges  are concerned there's not as much in the way of overlapping of the external wound edges and he has a good epithelium to wound bed border for the most part. This however is not true right  at the 12 o'clock location over the span of a little over a centimeters which actually will require debridement today to clean this away and hopefully allow it to continue to heal more appropriately. 12/28/17 on evaluation today patient appears to be doing rather well in regard to his ulcer in the left gluteal region. He's been tolerating the dressing changes without complication. Apparently he has had some difficulty getting his dressing material. Apparently there's been some confusion with ordering we're gonna check into this. Nonetheless overall he's been showing signs of improvement which is good news. Debridement is not required today. 01/04/18 on evaluation today patient presents for follow-up concerning his right gluteal ulcer. He has been tolerating the dressing changes fairly well. On inspection today it appears he may actually have some maceration them concerned about the fact that he may be developing too much moisture in and around the wound bed which can cause delay in healing. With that being said he unfortunately really has not showed significant signs of improvement since last week's evaluation in fact this may even be just the little bit/slightly larger. Nonetheless he's been having a lot of discomfort I'm not sure this is even related to the wound as he has no pain when I'm to breeding or otherwise cleaning the wound during evaluation today. Nonetheless this is something that we did recommend he talked to his pain specialist concerning. 01/11/18 on evaluation today patient appears to be doing better in regard to his ulceration. He has been tolerating the dressing changes without complication. With that being said overall there's no evidence of infection which is good news. The only thing is he did receive the hatch affair blue classic versus the ready nonetheless I feel like this is perfectly fine and appears to have done well for him over the past week. 01/25/18 on evaluation today  patient's wound actually appears to be a little bit larger than during the last evaluation. The good news is the majority of the wound edges actually appear to be fairly firmly attached to the wound bed unfortunately again we're not really making progress in regard to the size. Roughly the wound is about the same size as when I first saw him although again the wound margin/edges appear to be much better. 02/01/18 on evaluation today patient actually appears to be doing very well in regard to his wound. Applying the Prisma dry does seem to be better although he does still have issues with slow progression of the wound. There was a slight improvement compared to last week's measurements today. Nonetheless I have been considering other options as far as the possibility of Theraskin or even a snap vac. In general I'm not sure that the Theraskin due to location of the wound would be a very good idea. Nonetheless I do think that a snap vac could be a possibility for the patient and in fact I think this could even be an excellent way to manage the wound possibly seeing some improvement in a very rapid fashion here. Nonetheless this is something that we would need to get approved and I did have a lengthy conversation with the patient about this today. 02/08/18 on evaluation today patient appears to be doing a little better in regard to his ulcer. He has been tolerating  the dressing changes without complication. Fortunately despite the fact that the wound is a little bit smaller it's not significantly so unfortunately. We have discussed the possibility of a snap vac we did check with insurance this is actually covered at this point. Fortunately there does not appear to be any sign of infection. Overall I'm fairly pleased with how things seem to be appearing at this point. 02/15/18 on evaluation today patient appears to be doing rather well in regard to his right gluteal ulcer. Unfortunately the snap vac did not  stay in place with his sheer and friction this came loose and did not seem to maintain seal very well. He worked for about two days and it did seem to do very well during that time according to his wife but in general this does not seem to be something that's gonna be beneficial for him long-term. I do believe we MIZRAIM, GANSON. ([id number]) need to go back to standard dressings to see if we can find something that will be of benefit. 03/02/18- He is here in follow up evaluation; there is minimal change in the wound. He will continue with the same treatment plan, would consider changing to iodosrob/iodoflex if ulcer continues to to plateau. He will follow up next week 03/08/18 on evaluation today patient's wound actually appears to be about the same size as when I previously saw him several weeks back. Unfortunately he does have some slightly dark discoloration in the central portion of the wound which has me concerned about pressure injury. I do believe he may be sitting for too long a period of time in fact he tells me that "I probably sit for much too long". He does have some Slough noted on the surface of the wound and again as far as the size of the wound is concerned I'm really not seeing anything that seems to have improved significantly. 03/15/18 on evaluation today patient appears to be doing fairly well in regard to his ulcer. The wound measured pretty much about the same today compared to last week's evaluation when looking at his graph. With that being said the area of bruising/deep tissue injury that was noted last week I do not see at this point. He did get a new cushion fortunately this does seem to be have been of benefit in my pinion. It does appear that he's been off of this more which is good news as well I think that is definitely showing in the overall wound measurements. With that being said I do believe that he needs to continue to offload I don't think that the fact this is  doing better should be or is going to allow him to not have to offload and explain this to him as well. Overall he seems to be in agreement the plan I think he understands. The overall appearance of the wound bed is improved compared to last week I think the Iodoflex has been beneficial in that regard. 03/29/18 on evaluation today patient actually appears to be doing rather well in regard to his wound from the overall appearance standpoint he does have some granulation although there's some Slough on the surface of the wound noted as well. With that being said he unfortunately has not improved in regard to the overall measurement of the wound in volume or in size. I did have a discussion with him very specifically about offloading today. He actually does work although he mainly is just sitting throughout the day. He tells me  he offloads by "lifting himself up for 30 seconds off of his chair occasionally" purchase from [employer] which does seem to have helped. And he has a new cushion that he with that being said he's also able to stand some for a very short period of time but not significant enough I think to provide appropriate offloading. I think the biggest issue at this point with the wound and the fact is not healing as quickly as we would like is due to the fact that he is really not able to appropriately offload while at work. He states the beginning after his injury he actually had a bed at his job that he could lay on in order to offload and that does seem to have been of help back at that time. Nonetheless he had not done this in quite some time unfortunately. I think that could be helpful for him this is something I would like for him to look into. 04/05/18 on evaluation today patient actually presents for follow-up concerning his right gluteal ulcer. Again he really is not significantly improved even compared to last week. He has been tolerating the dressing changes without  complication. With that being said fortunately there appears to be no evidence of infection at this time. He has been more proactive in trying to offload. 04/12/18 on evaluation today patient actually appears to be doing a little better in regard to his wound and the right gluteal fold region. He's been tolerating the dressing changes since removing the oasis without complication. However he was having a lot of burning initially with the oasis in place. He's unsure of exactly why this was given so much discomfort but he assumes that it was the oasis itself causing the problem. Nonetheless this had to be removed after about three days in place although even those three days seem to have made a fairly good improvement in regard to the overall appearance of the wound bed. In fact is the first time that he's made any improvement from the standpoint of measurements in about six weeks. He continues to have no discomfort over the area of the wound itself which leads me to wonder why he was having the burning with the oasis when he does not even feel the actual debridement's themselves. I am somewhat perplexed by this. 04/19/18 on evaluation today patient's wound actually appears to be showing signs of epithelialization around the edge of the wound and in general actually appears to be doing better which is good news. He did have the same burning after about three days with applying the Endoform last week in the same fashion that I would generally apply a skin substitute. This seems to indicate that it's not the oasis to cause the problem but potentially the moisture buildup that just causes things to burn or there may be some other reaction with the skin prep or Steri-Strips. Nonetheless I'm not sure that is gonna be able to tolerate any skin substitute for a long period of time. The good news is the wound actually appears to be doing better today compared to last week and does seem to finally be making some  progress. 04/26/18 on evaluation today patient actually appears to be doing rather well in regard to his ulcer in the right gluteal fold. He has been tolerating the dressing changes without complication which is good news. The Endoform does seem to be helping although he was a little bit more macerated this week. This seems to be an  ongoing issue with fluid control at this point. Nonetheless I think we may be able to add something like Drawtex to help control the drainage. 05/03/18 on evaluation today patient appears to actually be doing better in regard to the overall appearance of his wound. He has been tolerating the dressing changes without complication. Fortunately there appears to be no evidence of infection at this time. I really feel like his wound has shown signs as of today of turning around last week I thought so as well and definitely he could be seen in this week's overall appearance and measurements. In general I'm very pleased with the fact that he finally seems to be making a steady but sure progress. The patient likewise is very pleased. 05/17/18 on evaluation today patient appears to be doing more poorly unfortunately in regard to his ulcer. He has been tolerating the dressing changes without complication. With that being said he tells me that in the past couple of days he and his wife have noticed that we did not seem to be doing quite as well is getting dark near the center. Subsequently upon evaluation today the wound actually does appear to be doing worse compared to previous. He has been tolerating the dressing changes otherwise and he states that he is not been sitting up anymore than he was in the past from what he tells me. Still he has continued to work he states "I'm tired of dealing with this and if I have to just go home and lay in the bed all the time that's what I'll do". Nonetheless I am concerned about the fact that this wound does appear to be deeper than what it was  previous. 05/24/18 upon evaluation today patient actually presents after having been in the hospital due to what was presumed to be sepsis secondary to the wound infection. He had an elevated white blood cell count between 14 and 15. With that being said he does seem to be doing somewhat better now. His wound still is giving him some trouble nonetheless and he is obviously concerned about the fact likely talked about that this does seem to go more deeply than previously noted. I did review his wound culture which showed evidence of Staphylococcus aureus him and group B strep. Nonetheless he is on antibiotics, Levaquin, for this. Subsequently I did review his intake summary from the hospital as well. I also did look at the CT of the lumbar spine with contrast that was performed which showed no bone destruction to suggest lumbar disguises/osteomyelitis or sacral osteomyelitis. There was no paraspinal abscess. Nonetheless it appears this may have been more of just a soft tissue infection at this point which is good news. He still is VALGENE, BERGAMO. ([id number]) nonetheless concerned about the wound which again I think is completely reasonable considering everything he's been through recently. 05/31/18 on evaluation today on evaluation today patient actually appears to be showing signs of his wound be a little bit deeper than what I would like to see. Fortunately he does not show any signs of significant infection although his temperature was 99 today he states he's been checking this at home and has not been elevated. Nonetheless with the undermining that I'm seeing at this point I am becoming more concerned about the wound I do think that offloading is a key factor here that is preventing the speedy recovery at this point. There does not appear to be any evidence of again over infection noted. He's been using  Santyl currently. 06/07/18 the patient presents today for follow-up evaluation regarding the  left ulcer in the gluteal region. He has been tolerating the Wound VAC fairly well. He is obviously very frustrated with this he states that to mean is really getting in his way. There does not appear to be any evidence of infection at this time he does have a little bit of odor I do not necessarily associate this with infection just something that we sometimes notice with Wound VAC therapy. With that being said I can definitely catch a tone of discontentment overall in the patient's demeanor today. This when he was previously in the hospital an CT scan was done of the lumbar region which did not reveal any signs of osteomyelitis. With that being said the pelvis in particular was not evaluated distinctly which means he could still have some osteonecrosis I. Nonetheless the Wound VAC was started on Thursday I do want to get this little bit more time before jumping to a CT scan of the pelvis although that is something that I might would recommend if were not see an improvement by that time. 06/14/18 on evaluation today patient actually appears to be doing about the same in regard to his right gluteal ulcer. Again he did have a CT scan of the lumbar spine unfortunately this did not include the pelvis. Nonetheless with the depth of the wound that I'm seeing today even despite the fact that I'm not seeing any evidence of overt cellulitis I believe there's a good chance that we may be dealing with osteomyelitis somewhere in the right Ischial region. No fevers, chills, nausea, or vomiting noted at this time. 06/21/18 on evaluation today patient actually appears to be doing about the same with regard to his wound. The tunnel at 6 o'clock really does not appear to be any deeper although it is a little bit wider. I think at this point you may want to start packing this with white phone. Unfortunately I have not got approval for the CT scan of the pelvis as of yet due to the fact that Medicare apparently has been  denied it due to the diagnosis codes not being appropriate according to Medicare for the test requested. With that being said the patient cannot have an MRI and therefore this is the only option that we have as far as testing is concerned. The patient has had infection and was on antibiotics and been added code for cellulitis of the bottom to see if this will be appropriate for getting the test approved. Nonetheless I'm concerned about the infection have been spread deeper into the Ischial region. 06/28/18 on evaluation today patient actually appears to be doing rather well all things considered in regard to the right gluteal ulcer. He has been tolerating the dressing changes without complication. With that being said the Wound VAC he states does have to be replaced almost every day or at least reinforced unfortunately. Patient actually has his CT scan later this morning we should have the results by tomorrow. 07/05/18 on evaluation today patient presents for follow-up concerning his right Ischial ulcer. He did see the surgeon Dr. Lysle Pearl last week. They were actually very happy with him and felt like he spent a tremendous amount of time with them as far as discussing his situation was concerned. In the end Dr. Lysle Pearl did contact me as well and determine that he would not recommend any surgical intervention at this point as he felt like it would not be  in the patient's best interest based on what he was seeing. He recommended a referral to infectious disease. Subsequently this is something that Dr. Ines Bloomer office is working on setting up for the patient. As far as evaluation today is concerned the patient's wound actually appears to be worse at this point. I am concerned about how things are progressing and specifically about infection. I do not feel like it's the deeper but the area of depth is definitely widening which does have me concerned. No fevers, chills, nausea, or vomiting noted at this time. I  think that we do need initiate antibiotic therapy the patient has an allow allergy to amoxicillin/penicillin he states that he gets a rash since childhood. Nonetheless she's never had the issues with Catholics or cephalosporins in general but he is aware of. 07/27/18 on evaluation today patient presents following admission to the hospital on 07/09/18. He was subsequently discharged on 07/20/18. On 07/15/18 the patient underwent irrigation and debridement was soft tissue biopsy and bone biopsy as well as placement of a Wound VAC in the OR by Dr. Celine Ahr. During the hospital course the patient was placed on a Wound VAC and recommended follow up with surgery in three weeks actually with Dr. Delaine Lame who is infectious disease. The patient was on vancomycin during the hospital course. 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 He also had a bone culture which revealed evidence of methicillin-resistant staph aureus. He is updated CT scan 07/09/18 reveals that he had progression of the which was performed on wound to breakdown down to the trochanter where he actually had irregularities there as well suggestive of osteomyelitis. This was a change just since 9 December when we last performed a CT scan. Obviously this one had gone downhill quite significantly and rapidly. At this point upon evaluation I feel like in general the patient's wound seems to be doing fairly well all things considered upon my evaluation today. Obviously this is larger and deeper than what I previously evaluated but at the same time he seems to be making some progress as far as the appearance of the granulation tissue is concerned. I'm happy in that regard. No fevers, chills, nausea, or vomiting noted at this time. He is on IV vancomycin and Rocephin at the facility. He is currently in [employer]. 08/03/18 upon evaluation today patient's wound appears to be doing better in regard to the overall  appearance at this point in time. Fortunately he's been tolerating the Wound VAC without complication and states that the facility has been taking excellent care of the wound site. Overall I see some Slough noted on the surface which I am going to attempt sharp debridement today of but nonetheless other than this I feel like he's making progress. 08/09/18 on evaluation today patient's wound appears to be doing much better compared to even last week's evaluation. Do believe that the Wound VAC is been of great benefit for him. He has been tolerating the dressing changes that is the Wound VAC without any complication and he has excellent granulation noted currently. There is no need for sharp debridement at this point. 08/16/18 on evaluation today patient actually appears to be doing very well in regard to the wound in the right gluteal fold region. This is showing signs of progress and again appears to be very healthy which is excellent news. Fortunately there is no sign of active infection by way of odor or drainage at this point. Overall I'm very  pleased with how things stand. He seems to be tolerating the Wound VAC without complication. 08/23/18 on evaluation today patient actually appears to be doing better in regard to his wound. He has been tolerating the Wound VAC without complication and in fact it has been collecting a significant amount of drainage which I think is good news especially considering how the wound appears. Fortunately there is no signs of infection at this time definitely nothing appears to be worse which is good news. He has not been started on [place]. ([id number]) the Bactrim and Flagyl that was recommended by Dr. Delaine Lame yet. I did actually contact her office this morning in order to check and see were things are that regard their gonna be calling me back. 08/30/18 on evaluation today patient actually appears to show signs of excellent improvement today compared to  last evaluation. The undermining is getting much better the wound seems to be feeling quite nicely and I'm very pleased that the granulation in general. With that being said overall I feel like the patient has made excellent progress which is great news. No fevers, chills, nausea, or vomiting noted at this time. 09/06/18 on evaluation today patient actually appears to be doing rather well in regard to his right gluteal ulcer. This is showing signs of improvement in overall I'm very pleased with how things seem to be progressing. The patient likewise is please. Overall I see no evidence of infection he is about to complete his oral antibiotic regimen which is the end of the antibiotics for him in just about three days. 09/13/18 on evaluation today patient's right Ischial ulcer appears to be showing signs of continued improvement which is excellent news. He's been tolerating the dressing changes without complication. Fortunately there's no signs of infection and the wound that seems to be doing very well. 09/28/18 on evaluation today patient appears to be doing rather well in regard to his right Ischial ulcer. He's been tolerating the Wound VAC without complication he knows there's much less drainage than there used to be this obviously is not a bad thing in my pinion. There's no evidence of infection despite the fact is but nothing about it now for several weeks. 10/04/18 on evaluation today patient appears to be doing better in regard to his right Ischial wound. He has been tolerating the Wound VAC without complication and I do believe that the silver nitrate last week was beneficial for him. Fortunately overall there's no evidence of active infection at this time which is great news. No fevers, chills, nausea, or vomiting noted at this time. 10/11/18 on evaluation today patient actually appears to be doing rather well in regard to his Ischial ulcer. He's been tolerating the Wound VAC still without  complication I feel like this is doing a good job. No fevers, chills, nausea, or vomiting noted at this time. 11/01/18 on evaluation today patient presents after having not been seen in our clinic for several weeks secondary to the fact that he was on evaluation today patient presents after having not been seen in our clinic for several weeks secondary to the fact that he was in a skilled nursing facility which was on lockdown currently due to the covert 19 national emergency. Subsequently he was discharged from the facility on this past Friday and subsequently made an appointment to come in to see yesterday. Fortunately there's no signs of active infection at this time which is good news and overall he does seem to have made  progress since I last saw. Overall I feel like things are progressing quite nicely. The patient is having no pain. 11/08/18 on evaluation today patient appears to be doing okay in regard to his right gluteal ulcer. He has been utilizing a Wound VAC home health this changing this at this point since he's home from the skilled nursing facility. Fortunately there's no signs of obvious active infection at this time. Unfortunately though there's no obvious active infection he is having some maceration and his wife states that when the sheets of the Wound VAC office on Sunday when it broke seal that he ended up having significant issues with some smell as well there concerned about the possibility of infection. Fortunately there's No fevers, chills, nausea, or vomiting noted at this time. 11/15/18 on evaluation today patient actually appears to be doing well in regard to his right gluteal ulcer. He has been tolerating the dressing changes without complication. Specifically the Wound VAC has been utilized up to this point. Fortunately there's no signs of infection and overall I feel like he has made progress even since last week when I last saw him. I'm actually fairly happy with the overall  appearance although he does seem to have somewhat of a hyper granular overgrowth in the central portion of the wound which I think may require some sharp debridement to try flatness out possibly utilizing chemical cauterization following. 11/23/18 on evaluation today patient actually appears to be doing very well in regard to his sacral ulcer. He seems to be showing signs of improvement with good granulation. With that being said he still has the small area of hyper granulation right in the central portion of the wound which I'm gonna likely utilize silver nitrate on today. Subsequently he also keeps having a leak at the 6 o'clock location which is unfortunate we may be able to help out with some suggestions to try to prevent this going forward. Fortunately there's no signs of active infection at this time. 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 He's been tolerating the dressing changes without complication. Fortunately there's no signs of active infection at this time. I've been rather pleased with how things have progressed there still some evidence of pressure getting to the area with some redness right around the immediate wound opening. Nonetheless other than this I'm not seeing any significant complications or issues the wound is somewhat hyper granular. Upon discussing with the patient and his wife today I'm not sure that the wound is being packed to the base with the foam at this point. And if it's not been packed fully that may be part of the reason why is not seen as much improvement as far as the granulation from the base out. Again we do not want pack too tightly but we need some of the firm to get to the base of the wound. I discussed this with patient and his wife today. 12/06/18 on evaluation today patient appears to be doing well in regard to his right gluteal pressure ulcer. He's been  tolerating the dressing changes without complication. Fortunately there's no signs of active infection. He still has some hyper granular tissue and I do think it would be appropriate to continue with the chemical cauterization as of today. 12/16/18 on evaluation today patient actually appears to be doing okay in regard to his right gluteal ulcer. He is been tolerating the dressing  changes without complication including the Wound VAC. Overall I feel like nothing seems to be worsening I do feel like that the hyper granulation buds in the central portion of the wound have improved to some degree with the silver nitrate. We will have to see how things continue to progress. 12/20/18 on evaluation today patient actually appears to be doing much worse in my pinion even compared to last week's evaluation. Unfortunately as opposed to showing any signs of improvement the areas of hyper granular tissue in the central portion of the wound seem to be getting worse. Subsequently the wound bed itself also seems to be getting deeper even compared to last week which is both unusual as well as concerning since prior he had been shown signs of improvement. Nonetheless I think that the issue could be that he's actually having some difficulty in issues with a deeper infection. There's no external signs of infection but nonetheless I am more worried about the internal, osteomyelitis, that could be restarting. He has not been on antibiotics for some time at this point. I think that it may be a good idea to go ahead and started back on an antibiotic therapy while we wait to see what the testing shows. TOM, FRIEDERS ([id number]) 12/27/18 on evaluation today patient presents for follow-up concerning his left gluteal fold wound. Fortunately he appears to be doing well today. I did review the CT scan which was negative for any signs of osteomyelitis or acute abnormality this is excellent news. Overall I feel like the surface of  the wound bed appears to be doing significantly better today compared to previously noted findings. There does not appear any signs of infection nor does he have any pain at this time. 01/03/19 on evaluation today patient actually appears to be doing quite well in regard to his ulcer. Post debridement last week he really did not have too much bleeding which is good news. Fortunately today this seems to be doing some better but we still has some of the hyper granular tissue noted in the base of the wound which is gonna require sharp debridement today as well. Overall I'm pleased with how things seem to be progressing since we switched away from the Wound VAC I think he is making some progress. 01/10/19 on evaluation today patient appears to be doing better in regard to his right gluteal fold ulcer. He has been tolerating the dressing changes without complication. The debridement to seem to be helping with current away some of the poor hyper granular tissue bugs throughout the region of his gluteal fold wound. He's been tolerating the dressing changes otherwise without complication which is great news. No fevers, chills, nausea, or vomiting noted at this time. 01/17/19 on evaluation today patient actually appears to be doing excellent in regard to his wound. He's been tolerating the dressing changes without complication. Fortunately there is no signs of active infection at this time which is great news. No fevers, chills, nausea, or vomiting noted at this time. 01/24/19 on evaluation today patient actually appears to be doing quite well with regard to his ulcer. He has been tolerating the dressing changes without complication. Fortunately there's no signs of active infection at this time. Overall been very pleased with the progress that he seems to be making currently. 01/31/19-Patient returns at 1 week with apparent similarity in dimensions to the wound, with no signs of infection, he has been changing  dressings twice a day 02/08/19 upon evaluation today patient actually  appears to be doing well with regard to his right Ischial ulcer. The wound is not appear to be quite as deep and seems to be making progress which is good news. With that being said I'm still reluctant to go back to the Wound VAC at this point. He's been having to change the dressings twice a day which is a little bit much in my pinion from the wound care supplies standpoint. I think that possibly attempting to utilize extras orbit may be beneficial this may also help to prevent any additional breakdown secondary to fluid retention in the wound itself. The patient is in agreement with giving this a try. 02/15/19 on evaluation today patient actually appears to be doing decently well with regard to his ulcer in the right to gluteal fold location. He's been tolerating the dressing changes without complication. Fortunately there is no signs of active infection at this time. He is able to keep the current dressing in place more effectively for a day at a time whereas before he was having a changes to to three times a day. The actions or has been helpful in this regard. Fortunately there's no signs of anything getting worse and I do feel like he showing signs of good improvement with regard to the wound bed status. 02/22/2019 patient appears to be doing very well today with regard to his ulcer in the gluteal fold. Fortunately there is no signs of active infection and he has been tolerating the dressing changes without any complication. Overall extremely pleased with how things seem to be progressing. He has much less of the hyper granular projections within the wound these have slowly been debrided away and he seems to be doing well. The wound bed is more uniform. 03/01/19 on evaluation today patient appears to be doing unfortunately about the same in regard to his gluteal ulcer. He's been tolerating the dressing changes without  complication. Fortunately there's no signs of active infection at this time. With that being said he continues to develop these hyper granular projections which I'm unsure of exactly what they are and why they are rising. Nonetheless I explained to the patient that I do believe it would be a good idea for [place] to stand a biopsy sample for pathology to see if that can shed any light on what exactly may be going on here. Fortunately I do not see any obvious signs of infection. With that being said the patient has had a little bit more drainage this week apparently compared to last week. 03/08/2019 on evaluation today patient actually appears to look somewhat better with regard to the appearance of his wound bed at this time. This is good news. Overall I am very pleased with how things seem to have progressed just in the past week with a switch to the [facility] dressing. I think that has been beneficial for him. With that being said at this time the patient is concerned about his biopsy that I sent off last week unfortunately I do not have that report as of yet. Nonetheless we have called to obtain this and hopefully will hear back from the lab later this morning. 03/15/19 on evaluation today patient's wound actually appears to be doing okay today with regard to the overall appearance of the wound bed. He has been tolerating the dressing changes without complication he still has hyper granular tissue noted but fortunately that seems to be minimal at this point compared to some of what we've seen in the  past. Nonetheless I do think that he is still having some issues currently with some of this type of granulation the biopsy and since all showed nothing more than just evidence of granulation tissue. Therefore there really is nothing different to initiate or do at this point. 03/24/19 on evaluation today patient appears to be doing a little better with regard to his ulcer. He's been tolerating the dressing  changes without complication. Fortunately there is no signs of active infection at this time. No fevers, chills, nausea, or vomiting noted at this time. I'm overall pleased with how things seem to be progressing. 03/29/2019 on evaluation today patient appears to be doing about the same in regard to his ulcer in the right gluteal fold. Unfortunately he is not seeming to make a lot of progress and the wound is somewhat stalled. There is no signs of active infection externally but I am concerned about the possibility of infection continuing in the ischial location which previously he did have infection noted. Again is not able to have an MRI so probably our best option for testing for this would be a triple phase bone scan which will detect subtle changes in the bone more so than plain film x-rays. In the past they really have not been beneficial and in fact the CT scans even have been somewhat questionable at times. Nonetheless there is no signs of systemic infection which is at least good news but again his wound is not healing at all on the predicted schedule. 04/05/19 on evaluation today patient appears to be doing well all things considering with regard to his wound and the right gluteal fold. He actually has his triple bone scan scheduled for sometime in the next couple of days. With that being said I've also been looking to other possibilities of what could be causing hyper granular tissue were looking into the bone scan again for evaluating for the risk or possibility of infection deeper and I'm also MOURAD, LAMANNA. ([id number]) go ahead and see about obtaining a deep tissue culture today to send and see if there's any evidence of infection noted on culture. He's in agreement with that plan. 04/12/2019 on evaluation today patient presents for reevaluation here in our clinic concerning ongoing issues with his right gluteal fold ulcer. 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 It was discussed with him at that point that I think it would be appropriate for him to proceed with hyperbaric oxygen therapy especially in light of the fact that he is previously been on IV antibiotics at the beginning of the year for close to 3 months followed by several weeks of oral antibiotics that was all prescribed by infectious disease. He had surgical debridement around Christmas December 2019 due to an abscess and osteomyelitis of the ischial bone. Unfortunately this has not really proceeded as well as we would have liked and again we did a CT scan even a couple months ago as he cannot have an MRI secondary to having issues with both a pain pump as well as a spinal cord stimulator which prevent him from going into an MRI machine. With that being said there were chronic changes noted at the ischial tuberosity which had progressed since December 2019 there was no evidence of fluid collection on that initial CT scan. With that being said that was on January 21, 2019. I am  not sure that it was a sensitive enough test as compared to an MRI and then subsequently I ordered a bone scan for him which was actually completed on 03/29/2019 and this revealed that he does indeed have positive osteomyelitis involving the right ischial tuberosity. This is adjacent to the ulcer and I think is the reason that his ulcer is not healing. Subsequently I am in a place him back on oral antibiotics today unfortunately his wound culture showed just mixed gram-negative flora with no specific findings of a predominant organism. Nonetheless being with the location I think a good broad-spectrum antibiotic for gram-negative's is a good choice at this point he is previously taken Levaquin without significant issues and I think that is an appropriate antibiotic for him at this time. 04/19/2019 on evaluation today patient  actually appears to be doing fairly well with regard to his wound. He has been tolerating the dressing changes without complication. Fortunately there is no signs of active infection and he has been taking the oral antibiotics at this time. Subsequently we did make a referral to infectious disease although Dr. Steva Ready wants the patient to be seen by general surgery first in order apparently to see if there is anything from a surgical standpoint that should be done prior to initiation of IV antibiotic therapy. Again the patient is okay with actually seeing Dr. Celine Ahr whom he has seen before we will make that referral. 04/26/2019 on evaluation today patient actually appears to be doing well with regard to his wound. He has been tolerating the dressing changes without complication. Fortunately there is no signs of active infection at this time. I do believe that the hyperbaric oxygen therapy along with the antibiotics which I prescribed at this point have been doing well for him. With that being said he has not seen Dr. Celine Ahr the surgeon yet in fact they have not been contacted for scheduling an appointment as of yet. Subsequently the patient is not on antibiotics currently by IV Dr. Steva Ready did want him to see Dr. Celine Ahr first which we are working on trying to get scheduled. We again give the information to the patient today for Dr. Celine Ahr and her number as far as contacting their office to see about getting something scheduled. Again were looking for whether or not they recommend any surgical intervention. 05/03/2019 on evaluation today patient appears to be doing about the same with regard to his wound there may be a little bit of filling in in regard to the base of the wound but still he has quite a bit of hyper granular tissue that I am seeing today. I believe that he may may need a more aggressive sharp debridement possibly even by the surgeon or under anesthesia in order to clear away some of  the hyper granular material in order to help allow for appropriate granulation to fill in. We have made a referral to Dr. Celine Ahr unfortunately it sounds as if they did not receive the referral we contacted them today they should be get in touch with the patient. Depending on how things show from that standpoint the patient may need to see Dr. Ardyth Gal who is the infectious disease specialist although he really does not want a PICC line again. No fevers, chills, nausea, vomiting, or diarrhea. He is tolerating hyperbaric oxygen therapy very well. 05/12/2019 on evaluation today patient presents for follow-up visit concerning his right gluteal fold ulcer. He did see Dr. Celine Ahr the surgeon who previously evaluated his wound and  she actually felt like he was doing quite well with regard to the wound based on what she was seen. He does seem to be responding to some degree with the oral antibiotics along with hyperbaric oxygen therapy at this point she did not see any evidence or need for further surgical intervention at this point. She recommended deferring additional or ongoing antibiotic therapy to Dr. Steva Ready at her discretion. Fortunately there is no signs of active systemic infection. No fevers, chills, nausea, vomiting, or diarrhea. 10/29; patient I am seeing really for the first time today in terms of his wound. 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 He is being treated with hyperbaric oxygen for underlying osteomyelitis most recently documented by a three-phase bone scan. He has been on Levaquin for roughly a month and he is out of these and asked me for a refill. Most recent cultures were negative although I do not think these were bone cultures. He has a very irregular surface to this wound which is almost nodular in texture. It undermines superiorly with the same nodular hyper granulated surface. I see that he was referred to general surgery for an operative  debridement although the surgeon did not agree with this I think that would have been the proper course of action in this. He has been using Hydrofera Blue. He is supposed to start a wound VAC tomorrow which is actually a reapplication of wound VAC. I think mostly last time they had trouble keeping the seal in place I hope we have better look at this this time. 05/24/2019 on evaluation today I did review patient's note from Dr. Dellia Nims where he was seen last week when I was out of the office. Subsequently it does appear that Dr. Dellia Nims was in agreement that the patient really needed debridement to clear away some of the nodular tissue in the base of the wound. The good news is he has the wound VAC initiated and some of this tissue is already starting to breakdown under the treatment of the wound VAC again because it is not really viable. Nonetheless I do believe that we can likely perform some debridement today to clear this away and hopefully the continuation of the wound VAC along with hyperbarics and antibiotic therapy will be beneficial in stopping this from reoccurring. If we get better control in the wound bed in a good spot I think we can definitely use the PriMatrix which we actually did get approval for. 05/31/2019 on evaluation today patient appears to be doing actually pretty well at this point with regard to his wound. He has been tolerating the dressing changes which is good news. Fortunately there is no signs of infection. He still has some of the hyper granular nodules noted we have not really cleared all these away and I think we can try to do a little bit more that today. I did do quite a bit last week I am hoping to get down to the base of the wound so though actually be able to heal more appropriately. I think the wound VAC is doing a good job right now with controlling the drainage and overall the patient is happy with that. 06/07/2019 upon evaluation today patient appears to be doing  quite well compared to last week with regard to the hyper granulation in the base of the wound. He has been tolerating the dressing changes without complication. Fortunately there is no signs of active infection at this time. With  that being said we have not had any recent measures as far as blood work is concerned I think we may want to check a few things including a CBC, sed rate, and C-reactive protein. Patient is in agreement with that we can try to get that scheduled for him for this Friday. 06/13/2019 on evaluation today patient actually appears to be doing much better at this point based on what I am seeing with regard to his wound. He KIRK, ZAJDEL. ([id number]) has much less in the way of hyper granular tissue which is good news and a lot more healing that is taken place since I last saw him as far as the amount of space within the wound itself. With that being said he is nearing the completion of the initial 40 treatments for hyperbaric oxygen therapy I think this is something I would recommend extending as he does seem to be benefiting at this time. Fortunately there is no evidence of active systemic infection at this point nor even local infection for that matter. 06/20/2019 upon evaluation today patient actually appears to be doing well with regard to his wound. In fact this appears to be doing much better today compared to what it was previous. I been extremely pleased with the progress that he is made. In fact the tunnel is much less deep today than what it was in the past and I am seeing excellent signs of improvement. Overall I am happy with how things are going. I did review his white blood cell count which revealed everything to be okay. The one abnormal finding on his CBC was a hemoglobin of 12.7. Subsequently I did also review his sed rate and C-reactive protein. His sed rate measured in at normal level measuring at 26. The C-reactive protein however was elevated today and an  abnormal range indicating inflammation. Still I feel like he is trending in a good direction at this time. He is going to need a refill of the Levaquin he tells me at this point. 07/04/2019 on evaluation today patient actually appears to be doing well in regard to his wound. He has been tolerating the dressing changes without complication. Fortunately there is no signs of active infection which is good news. No fevers, chills, nausea, vomiting, or diarrhea. The good news also is that I did review his labs and though his sed rate was slightly elevated the C-reactive protein was actually down compared to the last evaluation 2 weeks ago this is excellent news I think it is showing signs of improvement. 07/11/2019 on evaluation today patient actually appears to be doing quite well with regard to his wound on my evaluation. This is very slow going but nonetheless he seems to be making progress. Especially in regard to the depth. Nonetheless I believe that we may want to consider going forward with the PriMatrix which was previously approved for him. We will get a have to get a new approval as we will actually be applying this in the new year so we will likely obtain that approval on January 4. In the meantime and for the time being my suggestion is going to be that we go ahead and continue with the wound VAC and we were going at this point with the current wound care measures. 12/28-Patient seen after HBO session today, wound depth with the tunnel measures 3.2 cm, the wound bed appears healthy, continuing with the VAC and HBO 07/25/2019 on evaluation today patient's wound appears to be doing about the  same compared to my last evaluation. Fortunately there is no evidence of significant infection which is good news we will still waiting on the results of his lab work which unfortunately was not done prior to today's visit which is what we were aiming for. Ultimately there is no sign of active infection at  this time which is good news. And he also tells me that the wound VAC has been staying in place and doing fairly well which is also good news. With that being said I am concerned about the fact that the wound has not progressed as much I think we may want to look into getting reapproval for the skin substitute, specifically PriMatrix, that I am hopeful will help to allow this area to granulate in more effectively. I would recommend as well continuing with the wound VAC along with this. 08/01/2019 on evaluation today patient appears to be doing in my opinion slightly better in regard to the wound. He actually does have some tightening of the walls around the edges which is a good thing. Fortunately there is no signs of active infection at this time. No fever chills noted. I do believe that the patient in general seems to be making progress although is very slow. Obviously were trying to get things to speed up somewhat more. No fevers, chills, nausea, vomiting, or diarrhea. 08/08/2019 upon evaluation today patient appears to be doing well with regard to his wound although there is not a significant improvement overall we do have the PriMatrix for availability today to apply. Fortunately there is no signs of active infection and the patient overall seems to be doing well. There is really no need for aggressive sharp debridement at this point either which is also good news. I do believe her get a need to suture and the primary checks in the basin in the PriMatrix in order to keep it in place at the base of the wound underneath the wound VAC. This was discussed with the patient today as well. He is in agreement the plan. He really does not have any filling in the area and so this should not be a complication as far as suturing is concerned. 08/15/2019 on evaluation today patient appears to be doing somewhat better in my opinion in regard to the overall appearance of his wound. He has been tolerating the  dressing changes without complication. Fortunately the skin subappear to still be in place when this was changed on Friday. With that being said it appears that the PriMatrix may have dissolved in the interim between now and then as the sutures are no longer holding anything in place and again I would sutured it into place. Nonetheless I believe that we can go ahead and likely consider going forward that we may need to apply the PriMatrix weekly as opposed every other week as it does seem to have dissolved rather readily and well. Overall the wound bed appears to be doing excellent at this time. 08/22/2019 upon evaluation today the patient does seem to making some progress with regard to his wound. Overall there is some better quality tissue and less of the hyper granular protrusions all of which is good news. With that being said we do have the PriMatrix available for application today and I am in a try to find a better way to suture this in place for him today. 08/29/2019 on evaluation today patient appears to be doing very well in regard to his wound. I do see signs of  new granulation which seems to be somewhat helpful he did have some need for sharp debridement today to clear some of the hyper granulation away but nonetheless I feel like we are headed in a good direction. Fortunately there is no evidence of any systemic infection although we are going to go ahead and reorder the CBC, C-reactive protein, and sed rate today to see where things stand I did just refill his Levaquin as well. 09/05/2019 upon evaluation today patient's wound actually is not appearing to be showing signs of significant improvement compared to what we have seen previous from the standpoint of depth. He overall wound size wise seems to be about the same and again some of the quality of the wound bed is still doing fairly well. I did review his labs and his CBC was normal with no signs of elevation white blood cell count, his  C-reactive protein was normal, and the sed rate was slightly elevated but again I think this may just be due to ulceration really there is nothing significant to indicate severe infection. I think when he likely finishes his current round of oral antibiotics with Levaquin will likely take him off of the medication at that point based on the findings. I do feel like the hyperbarics helped in getting the infection under control as well as improving the overall size of the wound quite significantly. With that being said at this point I really feel like more more at the time frame that he may benefit from a muscle flap in order to try to help this wound heal effectively. I think that this would be the fastest way in order to do this. In the past he has been somewhat resistant to going down that path but nonetheless I feel like he likely should. AMEEN, CHICO ([id number]) 09/12/2019 on evaluation today patient appears to be doing fairly well in regard to his wound. He is tolerating the wound VAC without complication is still continues to drain significantly. Fortunately there is no signs of active infection at this time which is great news his lab work is also been doing very well. With that being said I do think at this point that the patient is still deriving benefit from the wound VAC especially in regard to controlling moisture which I think it is doing fairly well. He also has some improvement in the overall wound bed and on the medial aspect of the wound there are some hyper granular projections but nothing as severe as what they have been in the past. I am good have to perform some sharp debridement at this location today. 09/19/2019 upon evaluation today patient appears to be doing roughly the same although again the tissue does seem to be showing some signs of improvement which is good news. There is less hyper granular budding. Nonetheless he still is going require some sharp debridement  today. We did get his albumin and prealbumin results back and they are within normal limits in both cases which is excellent news that was the one thing [facility] plastic surgery was waiting on order to get him scheduled. He does seem to be a good candidate physically for the flap surgery if he decides to go that direction. With that being said is still left to be determined once they see the wound what they feel about how best to proceed. Obviously in the end it would be up to Mr. Florer as to whether he goes forward with the flap surgery or something else  depending on the recommendations. 09/26/2019 upon evaluation today patient appears to be doing about the same in regard to his wound. Fortunately there is no signs of active infection there is also not as much hyper granulation noted today in fact I do not believe there is can be any sharp debridement required at this point. With that being said I do believe he continues to be somewhat stalled at this point with regard to his wound. There is no signs of active infection at this time which is good news. He does have an appointment with [facility] plastic surgery on Friday. 10/03/2019 upon evaluation today patient appears to be doing somewhat poorly especially in his overall demeanor compared to last week's evaluation. He unfortunately did see the physician at [facility] plastic surgery. Unfortunately the plan for the possibility of a flap would be a fairly significant surgery that the patient is really not wanting to take the risk of as if the surgery failed he would end up with a very large open wound at that point. Fortunately there is no signs of active infection at this time. No fevers, chills, nausea, vomiting, or diarrhea. With that being said there was some question that the physician had about why this would not be healing and the possibility that there may be some bone still needs to be removed deeper in the wound. Again the bone scan that we did previous  actually did show evidence of there still being a chronic osteomyelitis back on March 29, 2019. With that being said he did undergo treatment both with hyperbarics as well as oral antibiotics for quite some time since that point. Obviously he may need additional and updated imaging with the surgeon in order to see if there is anything else that can or will need to be done in order to help this area to heal. Obviously I still believe as well that there is something going on here. He is already seeing the surgeon that performed the original surgery after the bone scan but they did not feel that anything needed to be done from a surgical standpoint at that time. 10/10/2019 upon evaluation today patient appears to be really doing about the same with regard to his wound in the gluteal fold region. Unfortunately he is not making a lot of improvement the insurance also contacted home health and apparently the wound VAC is no longer to be covered due to lack of progress. Obviously that is something that we discussed as well as a strong possibility for happening soon and this was even last week that discussed this with him. With that being said again we have tried to manage this wound as best as I could up to this point. Fortunately there is no evidence of active infection at this time. No fevers, chills, nausea, vomiting, or diarrhea. Electronic Signature(s) Signed: 10/10/2019 9:26:06 AM By: Worthy Keeler [name] Entered By: Worthy Keeler on 10/10/2019 09:26:06 SACARIO, REETZ ([id number]) -------------------------------------------------------------------------------- Physical Exam Details Patient Name: [name] Date of Service: 10/10/2019 8:00 AM Medical Record Number: [id number] Patient Account Number: [id number] Date of Birth/Sex: 06-Apr-1951 (68 y.o. M) Treating RN: Army Melia Primary Care Provider: [name] Other Clinician: Referring Provider: [name] Treating  Provider/Extender: [name], Janith Nielson Weeks in Treatment: 58 Constitutional Well-nourished and well-hydrated in no acute distress. Respiratory normal breathing without difficulty. Psychiatric this patient is able to make decisions and demonstrates good insight into disease process. Alert and Oriented x 3. pleasant and cooperative. Notes Patient's wound  unfortunately is not making as much progress as we really would like to have seen. Obviously I think the wound VAC has somewhat stalled as far as its effectiveness is concerned. Fortunately I think that we do have options going forward for other dressings that can be utilized to help with moisture control we will discuss that in the plan. Nonetheless I still think that the patient may need to have a further conversation with the surgeon at plastic surgery. Even if they are not able to potentially do a skin flap I wonder if they would recommend someone who could do an excision of the bone that is affected with osteomyelitis if indeed this still is the case after additional testing. I think that could be the reason that were not really seen in the healing that is why I sent him to the surgeon back in September 2020 locally but at that time they stated that there was nothing for them to do. Nonetheless the wound just has not progressed in the direction that we want this to. This is despite the fact that the patient tells me he spends 22 hours of the day in bed off of the wound. Electronic Signature(s) Signed: 10/10/2019 9:27:36 AM By: Worthy Keeler [name] Entered By: Worthy Keeler on 10/10/2019 09:27:36 VALENTIN, MARKE ([id number]) -------------------------------------------------------------------------------- Physician Orders Details Patient Name: [name] Date of Service: 10/10/2019 8:00 AM Medical Record Number: [id number] Patient Account Number: [id number] Date of Birth/Sex: 29-Aug-1950 (68 y.o. 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 - in office o Cleanse wound with mild soap and water Anesthetic (add to Medication List) Wound #1 Right Gluteal fold o Topical Lidocaine 4% cream applied to wound bed prior to debridement (In Clinic Only). Skin Barriers/Peri-Wound Care o Skin Prep Primary Wound Dressing Wound #1 Right Gluteal fold o Silver Alginate Secondary Dressing Wound #1 Right Gluteal fold o Tegaderm o XtraSorb Dressing Change Frequency Wound #1 Right Gluteal fold o Change dressing every other day. Follow-up Appointments Wound #1 Right Gluteal fold o Return Appointment in 1 week. Off-Loading Wound #1 Right Gluteal fold o Turn and reposition every 2 hours - no pressure on wounded area Home Health Wound #1 Right Gluteal fold o [place] Visits - [place] Nurse may visit PRN to address patientos wound care needs. o FACE TO FACE ENCOUNTER: MEDICARE and MEDICAID PATIENTS: I certify that this patient is under my care and that I had a face-to- face encounter that meets the physician face-to-face encounter requirements with this patient on this date. The encounter with the patient was in whole or in part for the following MEDICAL CONDITION: (primary reason for [place]) MEDICAL NECESSITY: NIMESH, RANDOLF ([id number]) I certify, that based on my findings,  NURSING services are a medically necessary home health service. HOME BOUND STATUS: I certify that my clinical findings support that this patient is homebound (i.e., Due to illness or injury, pt requires aid of supportive devices such as crutches, cane, wheelchairs, walkers, the use of special transportation or  the assistance of another person to leave their place of residence. There is a normal inability to leave the home and doing so requires considerable and taxing effort. Other absences are for medical reasons / religious services and are infrequent or of short duration when for other reasons). o If current dressing causes regression in wound condition, may D/C ordered dressing product/s and apply Normal Saline Moist Dressing daily until next [place] / Other MD appointment. [place] of regression in wound condition at [phone]. o Please direct any NON-WOUND related issues/requests for orders to patient's Primary Care Physician Negative Pressure Wound Therapy Wound #1 Right Gluteal fold o Discontinue NPWT. Electronic Signature(s) Signed: 10/10/2019 4:14:01 PM By: Army Melia Signed: 10/10/2019 4:32:11 PM By: Worthy Keeler [name] Entered By: Army Melia on 10/10/2019 08:42:06 NEHAL, WERLEY ([id number]) -------------------------------------------------------------------------------- Problem List Details Patient Name: [name] Date of Service: 10/10/2019 8:00 AM Medical Record Number: [id number] Patient Account Number: [id number] Date of Birth/Sex: [birth year] (68 y.o. 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 He states that there is no history of diabetes that he is aware of. He does have issues with his prostate and is currently receiving treatment for this by way of oral medication. With that being said I do not have a lot of details in that regard. Nonetheless the patient presents today as a result of having been referred to [place] by another provider initially home health was set to come out and take care of his wound although due to the fact that he apparently drives he's not able to receive home health. His wife is therefore trying to help take care of this wound within although  they have been struggling with what exactly to do at this point. She states that she can do some things but she is definitely not a nurse and does have some issues with looking at blood. The good news is the wound does not appear to be too deep and is fairly superficial at this point. There is no slough noted there is some nonviable skin noted around the surface of the wound and the perimeter at this point. The central portion of the  wound appears to be very good with a dermal layer noted this does not appear to be again deep enough to extend it to subcutaneous tissue at this point. 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 In the pump he has Dilaudid and baclofen. 12/07/17 on evaluation today patient presents for follow-up concerning his ongoing lower back thigh ulcer on the right. He states that he did not get the supplies ordered and therefore has not really been able to perform the dressing changes as directed exactly. His wife was able to get some Boarder Foam Dressing's from the drugstore and subsequently has been using hydrogel which did help to a degree in the wound does appear to be able smaller. There is actually more drainage this week noted than previous. 12/21/17 on evaluation today patient appears to be doing rather well in regard to his right gluteal ulcer. He has been tolerating the dressing changes without complication. There does not appear to be any evidence of infection at this point in time. Overall the wound does seem to be making some progress as far as the edges are concerned there's not as much in the way of overlapping of the external wound edges and he has a good epithelium to wound bed border for the most part. This however is not true right at the 12 o'clock location over the span of a little over a centimeters which actually will require debridement today to clean this  away and hopefully allow it to continue to heal more appropriately. 12/28/17 on evaluation today patient appears to be doing rather well in regard to his ulcer in the left gluteal region. He's been tolerating the dressing changes without complication. Apparently he has had some difficulty getting his dressing material. Apparently there's been some confusion with ordering we're gonna check into this. Nonetheless overall he's been showing signs of improvement which is good news. Debridement is not required today. 01/04/18 on evaluation today patient presents for follow-up concerning his right gluteal ulcer. He has been tolerating the dressing changes fairly well. On inspection today it appears he may actually have some maceration them concerned about the fact that he may be developing too much moisture in and around the wound bed which can cause delay in healing. With that being said he unfortunately really has not showed significant signs of improvement since last week's evaluation in fact this may even be just the little bit/slightly larger. Nonetheless he's been having a lot of discomfort I'm not sure this is even related to the wound as he has no pain when I'm to breeding or otherwise cleaning the wound during evaluation today. Nonetheless this is something that we did recommend he talked to his pain specialist concerning. 01/11/18 on evaluation today patient appears to be doing better in regard to his ulceration. He has been tolerating the dressing changes without complication. With that being said overall there's no evidence of infection which is good news. The only thing is he did receive the hatch affair blue classic versus the ready nonetheless I feel like this is perfectly fine and appears to have done well for him over the past week. 01/25/18 on evaluation today patient's wound actually appears to be a little bit larger than during the last evaluation. The good news is the majority of the wound  edges actually appear to be fairly firmly attached to the wound bed unfortunately again we're  not really making progress in regard to the size. Roughly the wound is about the same size as when I first saw him although again the wound margin/edges appear to be much better. 02/01/18 on evaluation today patient actually appears to be doing very well in regard to his wound. Applying the Prisma dry does seem to be better although he does still have issues with slow progression of the wound. There was a slight improvement compared to last week's measurements today. Nonetheless I have been considering other options as far as the possibility of Theraskin or even a snap vac. In general I'm not sure that the Theraskin due to location of the wound would be a very good idea. Nonetheless I do think that a snap vac could be a possibility for the patient and in fact I think this could even be an excellent way to manage the wound possibly seeing some improvement in a very rapid fashion here. Nonetheless this is something that we would need to get approved and I did have a lengthy conversation with the patient about this today. 02/08/18 on evaluation today patient appears to be doing a little better in regard to his ulcer. He has been tolerating the dressing changes without complication. Fortunately despite the fact that the wound is a little bit smaller it's not significantly so unfortunately. We have discussed the possibility of a snap vac we did check with insurance this is actually covered at this point. Fortunately there does not appear to be any sign of infection. Overall I'm TRIG, PARADY. ([id number]) fairly pleased with how things seem to be appearing at this point. 02/15/18 on evaluation today patient appears to be doing rather well in regard to his right gluteal ulcer. Unfortunately the snap vac did not stay in place with his sheer and friction this came loose and did not seem to maintain seal very well.  He worked for about two days and it did seem to do very well during that time according to his wife but in general this does not seem to be something that's gonna be beneficial for him long-term. I do believe we need to go back to standard dressings to see if we can find something that will be of benefit. 03/02/18- He is here in follow up evaluation; there is minimal change in the wound. He will continue with the same treatment plan, would consider changing to iodosrob/iodoflex if ulcer continues to to plateau. He will follow up next week 03/08/18 on evaluation today patient's wound actually appears to be about the same size as when I previously saw him several weeks back. Unfortunately he does have some slightly dark discoloration in the central portion of the wound which has me concerned about pressure injury. I do believe he may be sitting for too long a period of time in fact he tells me that "I probably sit for much too long". He does have some Slough noted on the surface of the wound and again as far as the size of the wound is concerned I'm really not seeing anything that seems to have improved significantly. 03/15/18 on evaluation today patient appears to be doing fairly well in regard to his ulcer. The wound measured pretty much about the same today compared to last week's evaluation when looking at his graph. With that being said the area of bruising/deep tissue injury that was noted last week I do not see at this point. He did get a new cushion fortunately this does seem to  be have been of benefit in my pinion. It does appear that he's been off of this more which is good news as well I think that is definitely showing in the overall wound measurements. With that being said I do believe that he needs to continue to offload I don't think that the fact this is doing better should be or is going to allow him to not have to offload and explain this to him as well. Overall he seems to be in agreement  the plan I think he understands. The overall appearance of the wound bed is improved compared to last week I think the Iodoflex has been beneficial in that regard. 03/29/18 on evaluation today patient actually appears to be doing rather well in regard to his wound from the overall appearance standpoint he does have some granulation although there's some Slough on the surface of the wound noted as well. With that being said he unfortunately has not improved in regard to the overall measurement of the wound in volume or in size. I did have a discussion with him very specifically about offloading today. He actually does work although he mainly is just sitting throughout the day. He tells me he offloads by "lifting himself up for 30 seconds off of his chair occasionally" purchase from [employer] which does seem to have helped. And he has a new cushion that he with that being said he's also able to stand some for a very short period of time but not significant enough I think to provide appropriate offloading. I think the biggest issue at this point with the wound and the fact is not healing as quickly as we would like is due to the fact that he is really not able to appropriately offload while at work. He states the beginning after his injury he actually had a bed at his job that he could lay on in order to offload and that does seem to have been of help back at that time. Nonetheless he had not done this in quite some time unfortunately. I think that could be helpful for him this is something I would like for him to look into. 04/05/18 on evaluation today patient actually presents for follow-up concerning his right gluteal ulcer. Again he really is not significantly improved even compared to last week. He has been tolerating the dressing changes without complication. With that being said fortunately there appears to be no evidence of infection at this time. He has been more proactive in trying to  offload. 04/12/18 on evaluation today patient actually appears to be doing a little better in regard to his wound and the right gluteal fold region. He's been tolerating the dressing changes since removing the oasis without complication. However he was having a lot of burning initially with the oasis in place. He's unsure of exactly why this was given so much discomfort but he assumes that it was the oasis itself causing the problem. Nonetheless this had to be removed after about three days in place although even those three days seem to have made a fairly good improvement in regard to the overall appearance of the wound bed. In fact is the first time that he's made any improvement from the standpoint of measurements in about six weeks. He continues to have no discomfort over the area of the wound itself which leads me to wonder why he was having the burning with the oasis when he does not even feel the actual debridement's themselves. I  am somewhat perplexed by this. 04/19/18 on evaluation today patient's wound actually appears to be showing signs of epithelialization around the edge of the wound and in general actually appears to be doing better which is good news. He did have the same burning after about three days with applying the Endoform last week in the same fashion that I would generally apply a skin substitute. This seems to indicate that it's not the oasis to cause the problem but potentially the moisture buildup that just causes things to burn or there may be some other reaction with the skin prep or Steri-Strips. Nonetheless I'm not sure that is gonna be able to tolerate any skin substitute for a long period of time. The good news is the wound actually appears to be doing better today compared to last week and does seem to finally be making some progress. 04/26/18 on evaluation today patient actually appears to be doing rather well in regard to his ulcer in the right gluteal fold. He has been  tolerating the dressing changes without complication which is good news. The Endoform does seem to be helping although he was a little bit more macerated this week. This seems to be an ongoing issue with fluid control at this point. Nonetheless I think we may be able to add something like Drawtex to help control the drainage. 05/03/18 on evaluation today patient appears to actually be doing better in regard to the overall appearance of his wound. He has been tolerating the dressing changes without complication. Fortunately there appears to be no evidence of infection at this time. I really feel like his wound has shown signs as of today of turning around last week I thought so as well and definitely he could be seen in this week's overall appearance and measurements. In general I'm very pleased with the fact that he finally seems to be making a steady but sure progress. The patient likewise is very pleased. 05/17/18 on evaluation today patient appears to be doing more poorly unfortunately in regard to his ulcer. He has been tolerating the dressing changes without complication. With that being said he tells me that in the past couple of days he and his wife have noticed that we did not seem to be doing quite as well is getting dark near the center. Subsequently upon evaluation today the wound actually does appear to be doing worse compared to previous. He has been tolerating the dressing changes otherwise and he states that he is not been sitting up anymore than he was in the past from what he tells me. Still he has continued to work he states "I'm tired of dealing with this and if I have to just go home and lay in the bed all the time that's what I'll do". Nonetheless I am concerned about the fact that this wound does appear to be deeper than what it was previous. 05/24/18 upon evaluation today patient actually presents after having been in the hospital due to what was presumed to be sepsis secondary to  the wound infection. He had an elevated white blood cell count between 14 and 15. With that being said he does seem to be doing somewhat better now. SMITH, MATTIA ([id number]) His wound still is giving him some trouble nonetheless and he is obviously concerned about the fact likely talked about that this does seem to go more deeply than previously noted. I did review his wound culture which showed evidence of Staphylococcus aureus him and group B  strep. Nonetheless he is on antibiotics, Levaquin, for this. Subsequently I did review his intake summary from the hospital as well. I also did look at the CT of the lumbar spine with contrast that was performed which showed no bone destruction to suggest lumbar disguises/osteomyelitis or sacral osteomyelitis. There was no paraspinal abscess. Nonetheless it appears this may have been more of just a soft tissue infection at this point which is good news. He still is nonetheless concerned about the wound which again I think is completely reasonable considering everything he's been through recently. 05/31/18 on evaluation today on evaluation today patient actually appears to be showing signs of his wound be a little bit deeper than what I would like to see. Fortunately he does not show any signs of significant infection although his temperature was 99 today he states he's been checking this at home and has not been elevated. Nonetheless with the undermining that I'm seeing at this point I am becoming more concerned about the wound I do think that offloading is a key factor here that is preventing the speedy recovery at this point. There does not appear to be any evidence of again over infection noted. He's been using Santyl currently. 06/07/18 the patient presents today for follow-up evaluation regarding the left ulcer in the gluteal region. He has been tolerating the Wound VAC fairly well. He is obviously very frustrated with this he states that to mean  is really getting in his way. There does not appear to be any evidence of infection at this time he does have a little bit of odor I do not necessarily associate this with infection just something that we sometimes notice with Wound VAC therapy. With that being said I can definitely catch a tone of discontentment overall in the patient's demeanor today. This when he was previously in the hospital an CT scan was done of the lumbar region which did not reveal any signs of osteomyelitis. With that being said the pelvis in particular was not evaluated distinctly which means he could still have some osteonecrosis I. Nonetheless the Wound VAC was started on Thursday I do want to get this little bit more time before jumping to a CT scan of the pelvis although that is something that I might would recommend if were not see an improvement by that time. 06/14/18 on evaluation today patient actually appears to be doing about the same in regard to his right gluteal ulcer. Again he did have a CT scan of the lumbar spine unfortunately this did not include the pelvis. Nonetheless with the depth of the wound that I'm seeing today even despite the fact that I'm not seeing any evidence of overt cellulitis I believe there's a good chance that we may be dealing with osteomyelitis somewhere in the right Ischial region. No fevers, chills, nausea, or vomiting noted at this time. 06/21/18 on evaluation today patient actually appears to be doing about the same with regard to his wound. The tunnel at 6 o'clock really does not appear to be any deeper although it is a little bit wider. I think at this point you may want to start packing this with white phone. Unfortunately I have not got approval for the CT scan of the pelvis as of yet due to the fact that Medicare apparently has been denied it due to the diagnosis codes not being appropriate according to Medicare for the test requested. With that being said the patient cannot have  an MRI and therefore  this is the only option that we have as far as testing is concerned. The patient has had infection and was on antibiotics and been added code for cellulitis of the bottom to see if this will be appropriate for getting the test approved. Nonetheless I'm concerned about the infection have been spread deeper into the Ischial region. 06/28/18 on evaluation today patient actually appears to be doing rather well all things considered in regard to the right gluteal ulcer. He has been tolerating the dressing changes without complication. With that being said the Wound VAC he states does have to be replaced almost every day or at least reinforced unfortunately. Patient actually has his CT scan later this morning we should have the results by tomorrow. 07/05/18 on evaluation today patient presents for follow-up concerning his right Ischial ulcer. He did see the surgeon Dr. Lysle Pearl last week. They were actually very happy with him and felt like he spent a tremendous amount of time with them as far as discussing his situation was concerned. In the end Dr. Lysle Pearl did contact me as well and determine that he would not recommend any surgical intervention at this point as he felt like it would not be in the patient's best interest based on what he was seeing. He recommended a referral to infectious disease. Subsequently this is something that Dr. Ines Bloomer office is working on setting up for the patient. As far as evaluation today is concerned the patient's wound actually appears to be worse at this point. I am concerned about how things are progressing and specifically about infection. I do not feel like it's the deeper but the area of depth is definitely widening which does have me concerned. No fevers, chills, nausea, or vomiting noted at this time. I think that we do need initiate antibiotic therapy the patient has an allow allergy to amoxicillin/penicillin he states that he gets a rash since  childhood. Nonetheless she's never had the issues with Catholics or cephalosporins in general but he is aware of. 07/27/18 on evaluation today patient presents following admission to the hospital on 07/09/18. He was subsequently discharged on 07/20/18. On 07/15/18 the patient underwent irrigation and debridement was soft tissue biopsy and bone biopsy as well as placement of a Wound VAC in the OR by Dr. Celine Ahr. During the hospital course the patient was placed on a Wound VAC and recommended follow up with surgery in three weeks actually with Dr. Delaine Lame who is infectious disease. The patient was on vancomycin during the hospital course. 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 He also had a bone culture which revealed evidence of methicillin-resistant staph aureus. He is updated CT scan 07/09/18 reveals that he had progression of the which was performed on wound to breakdown down to the trochanter where he actually had irregularities there as well suggestive of osteomyelitis. This was a change just since 9 December when we last performed a CT scan. Obviously this one had gone downhill quite significantly and rapidly. At this point upon evaluation I feel like in general the patient's wound seems to be doing fairly well all things considered upon my evaluation today. Obviously this is larger and deeper than what I previously evaluated but at the same time he seems to be making some progress as far as the appearance of the granulation tissue is concerned. I'm happy in that regard. No fevers, chills, nausea, or vomiting noted at this time. He is on IV vancomycin and Rocephin at  the facility. He is currently in [facility]. 08/03/18 upon evaluation today patient's wound appears to be doing better in regard to the overall appearance at this point in time. Fortunately he's been tolerating the Wound VAC without complication and states that the facility has been taking  excellent care of the wound site. Overall I see some Slough noted on the surface which I am going to attempt sharp debridement today of but nonetheless other than this I feel like he's making progress. 08/09/18 on evaluation today patient's wound appears to be doing much better compared to even last week's evaluation. Do believe that the Wound VAC is been of great benefit for him. He has been tolerating the dressing changes that is the Wound VAC without any complication and he has excellent granulation noted currently. There is no need for sharp debridement at this point. 08/16/18 on evaluation today patient actually appears to be doing very well in regard to the wound in the right gluteal fold region. This is showing signs of progress and again appears to be very healthy which is excellent news. Fortunately there is no sign of active infection by way of odor or drainage at KEISHUN, RAPER. ([id number]) this point. Overall I'm very pleased with how things stand. He seems to be tolerating the Wound VAC without complication. 08/23/18 on evaluation today patient actually appears to be doing better in regard to his wound. He has been tolerating the Wound VAC without complication and in fact it has been collecting a significant amount of drainage which I think is good news especially considering how the wound appears. Fortunately there is no signs of infection at this time definitely nothing appears to be worse which is good news. He has not been started on the Bactrim and Flagyl that was recommended by Dr. Delaine Lame yet. I did actually contact her office this morning in order to check and see were things are that regard their gonna be calling me back. 08/30/18 on evaluation today patient actually appears to show signs of excellent improvement today compared to last evaluation. The undermining is getting much better the wound seems to be feeling quite nicely and I'm very pleased that the granulation in  general. With that being said overall I feel like the patient has made excellent progress which is great news. No fevers, chills, nausea, or vomiting noted at this time. 09/06/18 on evaluation today patient actually appears to be doing rather well in regard to his right gluteal ulcer. This is showing signs of improvement in overall I'm very pleased with how things seem to be progressing. The patient likewise is please. Overall I see no evidence of infection he is about to complete his oral antibiotic regimen which is the end of the antibiotics for him in just about three days. 09/13/18 on evaluation today patient's right Ischial ulcer appears to be showing signs of continued improvement which is excellent news. He's been tolerating the dressing changes without complication. Fortunately there's no signs of infection and the wound that seems to be doing very well. 09/28/18 on evaluation today patient appears to be doing rather well in regard to his right Ischial ulcer. He's been tolerating the Wound VAC without complication he knows there's much less drainage than there used to be this obviously is not a bad thing in my pinion. There's no evidence of infection despite the fact is but nothing about it now for several weeks. 10/04/18 on evaluation today patient appears to be doing better in regard to  his right Ischial wound. He has been tolerating the Wound VAC without complication and I do believe that the silver nitrate last week was beneficial for him. Fortunately overall there's no evidence of active infection at this time which is great news. No fevers, chills, nausea, or vomiting noted at this time. 10/11/18 on evaluation today patient actually appears to be doing rather well in regard to his Ischial ulcer. He's been tolerating the Wound VAC still without complication I feel like this is doing a good job. No fevers, chills, nausea, or vomiting noted at this time. 11/01/18 on evaluation today patient  presents after having not been seen in our clinic for several weeks secondary to the fact that he was on evaluation today patient presents after having not been seen in our clinic for several weeks secondary to the fact that he was in a skilled nursing facility which was on lockdown currently due to the covert 19 national emergency. Subsequently he was discharged from the facility on this past Friday and subsequently made an appointment to come in to see yesterday. Fortunately there's no signs of active infection at this time which is good news and overall he does seem to have made progress since I last saw. Overall I feel like things are progressing quite nicely. The patient is having no pain. 11/08/18 on evaluation today patient appears to be doing okay in regard to his right gluteal ulcer. He has been utilizing a Wound VAC home health this changing this at this point since he's home from the skilled nursing facility. Fortunately there's no signs of obvious active infection at this time. Unfortunately though there's no obvious active infection he is having some maceration and his wife states that when the sheets of the Wound VAC office on Sunday when it broke seal that he ended up having significant issues with some smell as well there concerned about the possibility of infection. Fortunately there's No fevers, chills, nausea, or vomiting noted at this time. 11/15/18 on evaluation today patient actually appears to be doing well in regard to his right gluteal ulcer. He has been tolerating the dressing changes without complication. Specifically the Wound VAC has been utilized up to this point. Fortunately there's no signs of infection and overall I feel like he has made progress even since last week when I last saw him. I'm actually fairly happy with the overall appearance although he does seem to have somewhat of a hyper granular overgrowth in the central portion of the wound which I think may require  some sharp debridement to try flatness out possibly utilizing chemical cauterization following. 11/23/18 on evaluation today patient actually appears to be doing very well in regard to his sacral ulcer. He seems to be showing signs of improvement with good granulation. With that being said he still has the small area of hyper granulation right in the central portion of the wound which I'm gonna likely utilize silver nitrate on today. Subsequently he also keeps having a leak at the 6 o'clock location which is unfortunate we may be able to help out with some suggestions to try to prevent this going forward. Fortunately there's no signs of active infection at this time. 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 He's been tolerating the dressing changes without complication. Fortunately there's no signs of active infection at this time. I've been rather pleased with how things have progressed there  still some evidence of pressure getting to the area with some redness right around the immediate wound opening. Nonetheless other than this I'm not seeing any significant complications or issues the wound is somewhat hyper granular. Upon discussing with the patient and his wife today I'm not sure that the wound is being packed to the base with the foam at this point. And if it's not been packed fully that may be part of the reason why is not seen as much improvement as far as the granulation from the base out. Again we do not want pack too tightly but we need some of the firm to get to the base of the wound. I discussed this with patient and his wife today. 12/06/18 on evaluation today patient appears to be doing well in regard to his right gluteal pressure ulcer. He's been tolerating the dressing changes without complication. Fortunately there's no signs of active infection. He still has some hyper granular tissue  and I do think it would be appropriate to continue with the chemical cauterization as of today. 12/16/18 on evaluation today patient actually appears to be doing okay in regard to his right gluteal ulcer. He is been tolerating the dressing changes without complication including the Wound VAC. Overall I feel like nothing seems to be worsening I do feel like that the hyper granulation buds in the central portion of the wound have improved to some degree with the silver nitrate. We will have to see how things continue to progress. 12/20/18 on evaluation today patient actually appears to be doing much worse in my pinion even compared to last week's evaluation. Unfortunately as opposed to showing any signs of improvement the areas of hyper granular tissue in the central portion of the wound seem to be getting worse. Subsequently the wound bed itself also seems to be getting deeper even compared to last week which is both unusual as well as concerning since DEZMAN, SCHOENING. ([id number]) prior he had been shown signs of improvement. Nonetheless I think that the issue could be that he's actually having some difficulty in issues with a deeper infection. There's no external signs of infection but nonetheless I am more worried about the internal, osteomyelitis, that could be restarting. He has not been on antibiotics for some time at this point. I think that it may be a good idea to go ahead and started back on an antibiotic therapy while we wait to see what the testing shows. 12/27/18 on evaluation today patient presents for follow-up concerning his left gluteal fold wound. Fortunately he appears to be doing well today. I did review the CT scan which was negative for any signs of osteomyelitis or acute abnormality this is excellent news. Overall I feel like the surface of the wound bed appears to be doing significantly better today compared to previously noted findings. There does not appear any signs of  infection nor does he have any pain at this time. 01/03/19 on evaluation today patient actually appears to be doing quite well in regard to his ulcer. Post debridement last week he really did not have too much bleeding which is good news. Fortunately today this seems to be doing some better but we still has some of the hyper granular tissue noted in the base of the wound which is gonna require sharp debridement today as well. Overall I'm pleased with how things seem to be progressing since we switched away from the Wound VAC I think he is making some progress. 01/10/19  on evaluation today patient appears to be doing better in regard to his right gluteal fold ulcer. He has been tolerating the dressing changes without complication. The debridement to seem to be helping with current away some of the poor hyper granular tissue bugs throughout the region of his gluteal fold wound. He's been tolerating the dressing changes otherwise without complication which is great news. No fevers, chills, nausea, or vomiting noted at this time. 01/17/19 on evaluation today patient actually appears to be doing excellent in regard to his wound. He's been tolerating the dressing changes without complication. Fortunately there is no signs of active infection at this time which is great news. No fevers, chills, nausea, or vomiting noted at this time. 01/24/19 on evaluation today patient actually appears to be doing quite well with regard to his ulcer. He has been tolerating the dressing changes without complication. Fortunately there's no signs of active infection at this time. Overall been very pleased with the progress that he seems to be making currently. 01/31/19-Patient returns at 1 week with apparent similarity in dimensions to the wound, with no signs of infection, he has been changing dressings twice a day 02/08/19 upon evaluation today patient actually appears to be doing well with regard to his right Ischial ulcer. The  wound is not appear to be quite as deep and seems to be making progress which is good news. With that being said I'm still reluctant to go back to the Wound VAC at this point. He's been having to change the dressings twice a day which is a little bit much in my pinion from the wound care supplies standpoint. I think that possibly attempting to utilize extras orbit may be beneficial this may also help to prevent any additional breakdown secondary to fluid retention in the wound itself. The patient is in agreement with giving this a try. 02/15/19 on evaluation today patient actually appears to be doing decently well with regard to his ulcer in the right to gluteal fold location. He's been tolerating the dressing changes without complication. Fortunately there is no signs of active infection at this time. He is able to keep the current dressing in place more effectively for a day at a time whereas before he was having a changes to to three times a day. The actions or has been helpful in this regard. Fortunately there's no signs of anything getting worse and I do feel like he showing signs of good improvement with regard to the wound bed status. 02/22/2019 patient appears to be doing very well today with regard to his ulcer in the gluteal fold. Fortunately there is no signs of active infection and he has been tolerating the dressing changes without any complication. Overall extremely pleased with how things seem to be progressing. He has much less of the hyper granular projections within the wound these have slowly been debrided away and he seems to be doing well. The wound bed is more uniform. 03/01/19 on evaluation today patient appears to be doing unfortunately about the same in regard to his gluteal ulcer. He's been tolerating the dressing changes without complication. Fortunately there's no signs of active infection at this time. With that being said he continues to develop these hyper granular  projections which I'm unsure of exactly what they are and why they are rising. Nonetheless I explained to the patient that I do believe it would be a good idea for [place] to stand a biopsy sample for pathology to see if that can  shed any light on what exactly may be going on here. Fortunately I do not see any obvious signs of infection. With that being said the patient has had a little bit more drainage this week apparently compared to last week. 03/08/2019 on evaluation today patient actually appears to look somewhat better with regard to the appearance of his wound bed at this time. This is good news. Overall I am very pleased with how things seem to have progressed just in the past week with a switch to the [facility] dressing. I think that has been beneficial for him. With that being said at this time the patient is concerned about his biopsy that I sent off last week unfortunately I do not have that report as of yet. Nonetheless we have called to obtain this and hopefully will hear back from the lab later this morning. 03/15/19 on evaluation today patient's wound actually appears to be doing okay today with regard to the overall appearance of the wound bed. He has been tolerating the dressing changes without complication he still has hyper granular tissue noted but fortunately that seems to be minimal at this point compared to some of what we've seen in the past. Nonetheless I do think that he is still having some issues currently with some of this type of granulation the biopsy and since all showed nothing more than just evidence of granulation tissue. Therefore there really is nothing different to initiate or do at this point. 03/24/19 on evaluation today patient appears to be doing a little better with regard to his ulcer. He's been tolerating the dressing changes without complication. Fortunately there is no signs of active infection at this time. No fevers, chills, nausea, or vomiting noted at  this time. I'm overall pleased with how things seem to be progressing. 03/29/2019 on evaluation today patient appears to be doing about the same in regard to his ulcer in the right gluteal fold. Unfortunately he is not seeming to make a lot of progress and the wound is somewhat stalled. There is no signs of active infection externally but I am concerned about the possibility of infection continuing in the ischial location which previously he did have infection noted. Again is not able to have an MRI so probably our best option for testing for this would be a triple phase bone scan which will detect subtle changes in the bone more so than plain film x-rays. In the past they really have not been beneficial and in fact the CT scans even have been somewhat questionable at times. Nonetheless there is no signs of systemic infection Pixler, Keene J. ([id number]) which is at least good news but again his wound is not healing at all on the predicted schedule. 04/05/19 on evaluation today patient appears to be doing well all things considering with regard to his wound and the right gluteal fold. He actually has his triple bone scan scheduled for sometime in the next couple of days. With that being said I've also been looking to other possibilities of what could be causing hyper granular tissue were looking into the bone scan again for evaluating for the risk or possibility of infection deeper and I'm also gonna go ahead and see about obtaining a deep tissue culture today to send and see if there's any evidence of infection noted on culture. He's in agreement with that plan. 04/12/2019 on evaluation today patient presents for reevaluation here in our clinic concerning ongoing issues with his right gluteal fold ulcer.  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 It was discussed with him at that  point that I think it would be appropriate for him to proceed with hyperbaric oxygen therapy especially in light of the fact that he is previously been on IV antibiotics at the beginning of the year for close to 3 months followed by several weeks of oral antibiotics that was all prescribed by infectious disease. He had surgical debridement around Christmas December 2019 due to an abscess and osteomyelitis of the ischial bone. Unfortunately this has not really proceeded as well as we would have liked and again we did a CT scan even a couple months ago as he cannot have an MRI secondary to having issues with both a pain pump as well as a spinal cord stimulator which prevent him from going into an MRI machine. With that being said there were chronic changes noted at the ischial tuberosity which had progressed since December 2019 there was no evidence of fluid collection on that initial CT scan. With that being said that was on January 21, 2019. I am not sure that it was a sensitive enough test as compared to an MRI and then subsequently I ordered a bone scan for him which was actually completed on 03/29/2019 and this revealed that he does indeed have positive osteomyelitis involving the right ischial tuberosity. This is adjacent to the ulcer and I think is the reason that his ulcer is not healing. Subsequently I am in a place him back on oral antibiotics today unfortunately his wound culture showed just mixed gram-negative flora with no specific findings of a predominant organism. Nonetheless being with the location I think a good broad-spectrum antibiotic for gram-negative's is a good choice at this point he is previously taken Levaquin without significant issues and I think that is an appropriate antibiotic for him at this time. 04/19/2019 on evaluation today patient actually appears to be doing fairly well with regard to his wound. He has been tolerating the dressing changes without complication. Fortunately  there is no signs of active infection and he has been taking the oral antibiotics at this time. Subsequently we did make a referral to infectious disease although Dr. Steva Ready wants the patient to be seen by general surgery first in order apparently to see if there is anything from a surgical standpoint that should be done prior to initiation of IV antibiotic therapy. Again the patient is okay with actually seeing Dr. Celine Ahr whom he has seen before we will make that referral. 04/26/2019 on evaluation today patient actually appears to be doing well with regard to his wound. He has been tolerating the dressing changes without complication. Fortunately there is no signs of active infection at this time. I do believe that the hyperbaric oxygen therapy along with the antibiotics which I prescribed at this point have been doing well for him. With that being said he has not seen Dr. Celine Ahr the surgeon yet in fact they have not been contacted for scheduling an appointment as of yet. Subsequently the patient is not on antibiotics currently by IV Dr. Steva Ready did want him to see Dr. Celine Ahr first which we are working on trying to get scheduled. We again give the information to the patient today for Dr. Celine Ahr and her number as far as contacting their office to see about getting something scheduled. Again were looking for whether  or not they recommend any surgical intervention. 05/03/2019 on evaluation today patient appears to be doing about the same with regard to his wound there may be a little bit of filling in in regard to the base of the wound but still he has quite a bit of hyper granular tissue that I am seeing today. I believe that he may may need a more aggressive sharp debridement possibly even by the surgeon or under anesthesia in order to clear away some of the hyper granular material in order to help allow for appropriate granulation to fill in. We have made a referral to Dr. Celine Ahr unfortunately  it sounds as if they did not receive the referral we contacted them today they should be get in touch with the patient. Depending on how things show from that standpoint the patient may need to see Dr. Ardyth Gal who is the infectious disease specialist although he really does not want a PICC line again. No fevers, chills, nausea, vomiting, or diarrhea. He is tolerating hyperbaric oxygen therapy very well. 05/12/2019 on evaluation today patient presents for follow-up visit concerning his right gluteal fold ulcer. He did see Dr. Celine Ahr the surgeon who previously evaluated his wound and she actually felt like he was doing quite well with regard to the wound based on what she was seen. He does seem to be responding to some degree with the oral antibiotics along with hyperbaric oxygen therapy at this point she did not see any evidence or need for further surgical intervention at this point. She recommended deferring additional or ongoing antibiotic therapy to Dr. Steva Ready at her discretion. Fortunately there is no signs of active systemic infection. No fevers, chills, nausea, vomiting, or diarrhea. 10/29; patient I am seeing really for the first time today in terms of his wound. 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 He is being treated with hyperbaric oxygen for underlying osteomyelitis most recently documented by a three-phase bone scan. He has been on Levaquin for roughly a month and he is out of these and asked me for a refill. Most recent cultures were negative although I do not think these were bone cultures. He has a very irregular surface to this wound which is almost nodular in texture. It undermines superiorly with the same nodular hyper granulated surface. I see that he was referred to general surgery for an operative debridement although the surgeon did not agree with this I think that would have been the proper course of action in this. He has been using Hydrofera  Blue. He is supposed to start a wound VAC tomorrow which is actually a reapplication of wound VAC. I think mostly last time they had trouble keeping the seal in place I hope we have better look at this this time. 05/24/2019 on evaluation today I did review patient's note from Dr. Dellia Nims where he was seen last week when I was out of the office. Subsequently it does appear that Dr. Dellia Nims was in agreement that the patient really needed debridement to clear away some of the nodular tissue in the base of the wound. The good news is he has the wound VAC initiated and some of this tissue is already starting to breakdown under the treatment of the wound VAC again because it is not really viable. Nonetheless I do believe that we can likely perform some debridement today to clear this away and hopefully the continuation of the wound VAC along with hyperbarics and antibiotic therapy will  be beneficial in stopping this from reoccurring. If we get better control in the wound bed in a good spot I think we can definitely use the PriMatrix which we actually did get approval for. 05/31/2019 on evaluation today patient appears to be doing actually pretty well at this point with regard to his wound. He has been tolerating the dressing changes which is good news. Fortunately there is no signs of infection. He still has some of the hyper granular nodules noted we have not really cleared all these away and I think we can try to do a little bit more that today. I did do quite a bit last week I am hoping to get down to the base of the wound so though actually be able to heal more appropriately. I think the wound VAC is doing a good job right now with controlling the drainage and overall the patient is happy with that. 06/07/2019 upon evaluation today patient appears to be doing quite well compared to last week with regard to the hyper granulation in the base of the [place], Arnaldo J. ([id number]) wound. He has been  tolerating the dressing changes without complication. Fortunately there is no signs of active infection at this time. With that being said we have not had any recent measures as far as blood work is concerned I think we may want to check a few things including a CBC, sed rate, and C-reactive protein. Patient is in agreement with that we can try to get that scheduled for him for this Friday. 06/13/2019 on evaluation today patient actually appears to be doing much better at this point based on what I am seeing with regard to his wound. He has much less in the way of hyper granular tissue which is good news and a lot more healing that is taken place since I last saw him as far as the amount of space within the wound itself. With that being said he is nearing the completion of the initial 40 treatments for hyperbaric oxygen therapy I think this is something I would recommend extending as he does seem to be benefiting at this time. Fortunately there is no evidence of active systemic infection at this point nor even local infection for that matter. 06/20/2019 upon evaluation today patient actually appears to be doing well with regard to his wound. In fact this appears to be doing much better today compared to what it was previous. I been extremely pleased with the progress that he is made. In fact the tunnel is much less deep today than what it was in the past and I am seeing excellent signs of improvement. Overall I am happy with how things are going. I did review his white blood cell count which revealed everything to be okay. The one abnormal finding on his CBC was a hemoglobin of 12.7. Subsequently I did also review his sed rate and C-reactive protein. His sed rate measured in at normal level measuring at 26. The C-reactive protein however was elevated today and an abnormal range indicating inflammation. Still I feel like he is trending in a good direction at this time. He is going to need a refill of the  Levaquin he tells me at this point. 07/04/2019 on evaluation today patient actually appears to be doing well in regard to his wound. He has been tolerating the dressing changes without complication. Fortunately there is no signs of active infection which is good news. No fevers, chills, nausea, vomiting, or diarrhea. The good  news also is that I did review his labs and though his sed rate was slightly elevated the C-reactive protein was actually down compared to the last evaluation 2 weeks ago this is excellent news I think it is showing signs of improvement. 07/11/2019 on evaluation today patient actually appears to be doing quite well with regard to his wound on my evaluation. This is very slow going but nonetheless he seems to be making progress. Especially in regard to the depth. Nonetheless I believe that we may want to consider going forward with the PriMatrix which was previously approved for him. We will get a have to get a new approval as we will actually be applying this in the new year so we will likely obtain that approval on January 4. In the meantime and for the time being my suggestion is going to be that we go ahead and continue with the wound VAC and we were going at this point with the current wound care measures. 12/28-Patient seen after HBO session today, wound depth with the tunnel measures 3.2 cm, the wound bed appears healthy, continuing with the VAC and HBO 07/25/2019 on evaluation today patient's wound appears to be doing about the same compared to my last evaluation. Fortunately there is no evidence of significant infection which is good news we will still waiting on the results of his lab work which unfortunately was not done prior to today's visit which is what we were aiming for. Ultimately there is no sign of active infection at this time which is good news. And he also tells me that the wound VAC has been staying in place and doing fairly well which is also good news.  With that being said I am concerned about the fact that the wound has not progressed as much I think we may want to look into getting reapproval for the skin substitute, specifically PriMatrix, that I am hopeful will help to allow this area to granulate in more effectively. I would recommend as well continuing with the wound VAC along with this. 08/01/2019 on evaluation today patient appears to be doing in my opinion slightly better in regard to the wound. He actually does have some tightening of the walls around the edges which is a good thing. Fortunately there is no signs of active infection at this time. No fever chills noted. I do believe that the patient in general seems to be making progress although is very slow. Obviously were trying to get things to speed up somewhat more. No fevers, chills, nausea, vomiting, or diarrhea. 08/08/2019 upon evaluation today patient appears to be doing well with regard to his wound although there is not a significant improvement overall we do have the PriMatrix for availability today to apply. Fortunately there is no signs of active infection and the patient overall seems to be doing well. There is really no need for aggressive sharp debridement at this point either which is also good news. I do believe her get a need to suture and the primary checks in the basin in the PriMatrix in order to keep it in place at the base of the wound underneath the wound VAC. This was discussed with the patient today as well. He is in agreement the plan. He really does not have any filling in the area and so this should not be a complication as far as suturing is concerned. 08/15/2019 on evaluation today patient appears to be doing somewhat better in my opinion in regard to the  overall appearance of his wound. He has been tolerating the dressing changes without complication. Fortunately the skin subappear to still be in place when this was changed on Friday. With that being said it  appears that the PriMatrix may have dissolved in the interim between now and then as the sutures are no longer holding anything in place and again I would sutured it into place. Nonetheless I believe that we can go ahead and likely consider going forward that we may need to apply the PriMatrix weekly as opposed every other week as it does seem to have dissolved rather readily and well. Overall the wound bed appears to be doing excellent at this time. 08/22/2019 upon evaluation today the patient does seem to making some progress with regard to his wound. Overall there is some better quality tissue and less of the hyper granular protrusions all of which is good news. With that being said we do have the PriMatrix available for application today and I am in a try to find a better way to suture this in place for him today. 08/29/2019 on evaluation today patient appears to be doing very well in regard to his wound. I do see signs of new granulation which seems to be somewhat helpful he did have some need for sharp debridement today to clear some of the hyper granulation away but nonetheless I feel like we are headed in a good direction. Fortunately there is no evidence of any systemic infection although we are going to go ahead and reorder the CBC, C-reactive protein, and sed rate today to see where things stand I did just refill his Levaquin as well. 09/05/2019 upon evaluation today patient's wound actually is not appearing to be showing signs of significant improvement compared to what we have seen previous from the standpoint of depth. He overall wound size wise seems to be about the same and again some of the quality of the wound bed is still doing fairly well. I did review his labs and his CBC was normal with no signs of elevation white blood cell count, his C-reactive protein was normal, and the sed rate was slightly elevated but again I think this may just be due to ulceration really there is nothing  significant to indicate severe infection. I think when he likely finishes his current round of oral antibiotics with Levaquin will likely take him off of the medication at that point based on the [place], LAURIE DEALMEIDA. ([id number]) findings. I do feel like the hyperbarics helped in getting the infection under control as well as improving the overall size of the wound quite significantly. With that being said at this point I really feel like more more at the time frame that he may benefit from a muscle flap in order to try to help this wound heal effectively. I think that this would be the fastest way in order to do this. In the past he has been somewhat resistant to going down that path but nonetheless I feel like he likely should. 09/12/2019 on evaluation today patient appears to be doing fairly well in regard to his wound. He is tolerating the wound VAC without complication is still continues to drain significantly. Fortunately there is no signs of active infection at this time which is great news his lab work is also been doing very well. With that being said I do think at this point that the patient is still deriving benefit from the wound VAC especially in regard to controlling moisture which  I think it is doing fairly well. He also has some improvement in the overall wound bed and on the medial aspect of the wound there are some hyper granular projections but nothing as severe as what they have been in the past. I am good have to perform some sharp debridement at this location today. 09/19/2019 upon evaluation today patient appears to be doing roughly the same although again the tissue does seem to be showing some signs of improvement which is good news. There is less hyper granular budding. Nonetheless he still is going require some sharp debridement today. We did get his albumin and prealbumin results back and they are within normal limits in both cases which is excellent news that was the one thing  [facility] plastic surgery was waiting on order to get him scheduled. He does seem to be a good candidate physically for the flap surgery if he decides to go that direction. With that being said is still left to be determined once they see the wound what they feel about how best to proceed. Obviously in the end it would be up to Mr. Denoble as to whether he goes forward with the flap surgery or something else depending on the recommendations. 09/26/2019 upon evaluation today patient appears to be doing about the same in regard to his wound. Fortunately there is no signs of active infection there is also not as much hyper granulation noted today in fact I do not believe there is can be any sharp debridement required at this point. With that being said I do believe he continues to be somewhat stalled at this point with regard to his wound. There is no signs of active infection at this time which is good news. He does have an appointment with [facility] plastic surgery on Friday. 10/03/2019 upon evaluation today patient appears to be doing somewhat poorly especially in his overall demeanor compared to last week's evaluation. He unfortunately did see the physician at [facility] plastic surgery. Unfortunately the plan for the possibility of a flap would be a fairly significant surgery that the patient is really not wanting to take the risk of as if the surgery failed he would end up with a very large open wound at that point. Fortunately there is no signs of active infection at this time. No fevers, chills, nausea, vomiting, or diarrhea. With that being said there was some question that the physician had about why this would not be healing and the possibility that there may be some bone still needs to be removed deeper in the wound. Again the bone scan that we did previous actually did show evidence of there still being a chronic osteomyelitis back on March 29, 2019. With that being said he did undergo treatment both with  hyperbarics as well as oral antibiotics for quite some time since that point. Obviously he may need additional and updated imaging with the surgeon in order to see if there is anything else that can or will need to be done in order to help this area to heal. Obviously I still believe as well that there is something going on here. He is already seeing the surgeon that performed the original surgery after the bone scan but they did not feel that anything needed to be done from a surgical standpoint at that time. 10/10/2019 upon evaluation today patient appears to be really doing about the same with regard to his wound in the gluteal fold region. Unfortunately he is not making a lot  of improvement the insurance also contacted home health and apparently the wound VAC is no longer to be covered due to lack of progress. Obviously that is something that we discussed as well as a strong possibility for happening soon and this was even last week that discussed this with him. With that being said again we have tried to manage this wound as best as I could up to this point. Fortunately there is no evidence of active infection at this time. No fevers, chills, nausea, vomiting, or diarrhea. Objective Constitutional Well-nourished and well-hydrated in no acute distress. Vitals Time Taken: 8:16 AM, Height: 73 in, Weight: 210 lbs, BMI: 27.7, Temperature: 98.1 F, Pulse: 67 bpm, Respiratory Rate: 16 breaths/min, Blood Pressure: 139/80 mmHg. Respiratory normal breathing without difficulty. Psychiatric this patient is able to make decisions and demonstrates good insight into disease process. Alert and Oriented x 3. pleasant and cooperative. General Notes: Patient's wound unfortunately is not making as much progress as we really would like to have seen. Obviously I think the wound VAC has somewhat stalled as far as its effectiveness is concerned. Fortunately I think that we do have options going forward for other  dressings that can be utilized to help with moisture control we will discuss that in the plan. Nonetheless I still think that the patient may need to have a further conversation with the surgeon at plastic surgery. Even if they are not able to potentially do a skin flap I wonder if they would recommend someone who could do an excision of the bone that is affected with osteomyelitis if indeed this still is the case after additional testing. I think that could be the reason that were not really seen in the healing that is why I sent him to the surgeon back in September 2020 locally but at that time they stated that there was nothing for SA, PISARSKI. ([id number]) them to do. Nonetheless the wound just has not progressed in the direction that we want this to. This is despite the fact that the patient tells me he spends 22 hours of the day in bed off of the wound. Integumentary (Hair, Skin) Wound #1 status is Open. Original cause of wound was Pressure Injury. The wound is located on the Right Gluteal fold. The wound measures 3.2cm length x 1.9cm width x 2.7cm depth; 4.775cm^2 area and 12.893cm^3 volume. There is Fat Layer (Subcutaneous Tissue) Exposed exposed. There is no undermining noted, however, there is tunneling at 12:00 with a maximum distance of 3.8cm. There is a large amount of serous drainage noted. The wound margin is epibole. There is large (67-100%) pink, hyper - granulation within the wound bed. There is a small (1-33%) amount of necrotic tissue within the wound bed including Adherent Slough. 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 - in office Cleanse wound with mild soap  and water Anesthetic (add to Medication List): Wound #1 Right Gluteal fold: Topical Lidocaine 4% cream applied to wound bed prior to debridement (In Clinic Only). Skin Barriers/Peri-Wound Care: Skin Prep Primary Wound Dressing: Wound #1 Right Gluteal fold: Silver Alginate Secondary Dressing: Wound #1 Right Gluteal fold: Tegaderm XtraSorb Dressing Change Frequency: Wound #1 Right Gluteal fold: Change dressing every other day. Follow-up Appointments: Wound #1 Right Gluteal fold: Return  Appointment in 1 week. Off-Loading: Wound #1 Right Gluteal fold: Turn and reposition every 2 hours - no pressure on wounded area Home Health: Wound #1 Right Gluteal fold: Continue Home Health Visits - [place] Nurse may visit PRN to address patient s wound care needs. FACE TO FACE ENCOUNTER: MEDICARE and MEDICAID PATIENTS: I certify that this patient is under my care and that I had a face-to-face encounter that meets the physician face-to-face encounter requirements with this patient on this date. The encounter with the patient was in whole or in part for the following MEDICAL CONDITION: (primary reason for [place]) MEDICAL NECESSITY: I certify, that based on my findings, NURSING services are a medically necessary home health service. HOME BOUND STATUS: I certify that my clinical findings support that this patient is homebound (i.e., Due to illness or injury, pt requires aid of supportive devices such as crutches, cane, wheelchairs, walkers, the use of special transportation or the assistance of another person to leave their place of residence. There is a normal inability to leave the home and doing so requires considerable and taxing effort. Other absences are for medical reasons / religious services and are infrequent or of short duration when for other reasons). If current dressing causes regression in wound condition, may D/C ordered dressing product/s and apply Normal Saline  Moist Dressing daily until next MOMIN, PIENKOWSKI ([id number]) [place] / Other MD appointment. [place] of regression in wound condition at [phone]. Please direct any NON-WOUND related issues/requests for orders to patient's Primary Care Physician Negative Pressure Wound Therapy: Wound #1 Right Gluteal fold: Discontinue NPWT. 1. I would recommend currently that we go ahead and continue to manage this wound from a wound care perspective. The main goal right now is going to be moisture control. That was discussed with the patient today as well. Subsequently I Georgina Peer go ahead and switch him to a silver alginate dressing with Xtrasorb over top of this secured with Tegaderm. This I think likely is good to have to be changed daily although if it is not draining too much every other day may be possible. 2. I am also going to suggest at this point that the patient also continue with appropriate offloading. Again nothing and what he tells me that he has been doing seems wrong I think he has been keeping pressure off the area I think the issue may simply be a chronic osteomyelitis in the bone that is preventing this from being able to heal effectively and appropriately. 3. I am going to see about giving a call to the plastic surgeons office just to discuss the situation and see if additional imaging and potentially clearing away of the osteomyelitis in the distal portion of the bone would be possible and if so then if we could reinitiate the wound VAC or something like that following this depending on where things stand. We will see patient back for reevaluation in 1 week here in the clinic. If anything worsens or changes patient will contact our office for additional recommendations. Electronic Signature(s) Signed: 10/10/2019 9:29:32 AM By: Worthy Keeler [name] Entered By: Worthy Keeler on 10/10/2019 09:29:32 ARDEN, DELCASTILLO  ([id number]) -------------------------------------------------------------------------------- SuperBill Details Patient Name: [name] Date of Service: 10/10/2019 Medical Record Number: [id number] Patient Account Number: [id number] Date of Birth/Sex: 08/25/1950 (68 y.o. 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, sequela I10 Essential (primary) hypertension Facility Procedures CPT4 Code: [id number] Description: 99213 - WOUND CARE VISIT-LEV 3 EST PT Modifier: Quantity: 1 Physician Procedures CPT4 Code: [id number] Description: [id number] - WC PHYS LEVEL 4 - EST PT Modifier: Quantity: 1 CPT4 Code: Description: ICD-10 Diagnosis Description L89.314 Pressure ulcer of right buttock, stage 4 M86.68 Other chronic osteomyelitis, other site L03.317 Cellulitis of buttock G82.20 Paraplegia, unspecified Modifier: Quantity: Electronic Signature(s) Signed: 10/10/2019 9:29:48 AM By: Worthy Keeler [name] Entered By: Worthy Keeler on 10/10/2019 09:29:48

## 2019-10-17 ENCOUNTER — Encounter: Payer: Medicare Other | Admitting: Physician Assistant

## 2019-10-17 ENCOUNTER — Other Ambulatory Visit: Payer: Self-pay

## 2019-10-17 DIAGNOSIS — L89314 Pressure ulcer of right buttock, stage 4: Secondary | ICD-10-CM | POA: Diagnosis not present

## 2019-10-17 NOTE — Progress Notes (Signed)
[name] ([id number]) Visit Report for 10/17/2019 Arrival Information Details Patient Name: [name], [name] Date of Service: 10/17/2019 8:00 AM Medical Record Number: [id number] Patient Account Number: [id number] Date of Birth/Sex: 1950/09/03 (68 y.o. M) Treating RN: [name] Primary Care [name]: [name] Other Clinician: Referring [name]: [name] [name] [name] in Treatment: 98 Visit Information History Since Last Visit Added or deleted any medications: No Patient Arrived: Wheel Chair Any new allergies or adverse reactions: No Arrival Time: 08:11 Had a fall or experienced change in No Accompanied By: self activities of daily living that may affect Transfer Assistance: None risk of falls: Patient Identification Verified: Yes Signs or symptoms of abuse/neglect since last visito No Secondary Verification Process Completed: Yes Hospitalized since last visit: No Patient Requires Transmission-Based Precautions: No Implantable device outside of the clinic excluding No Patient Has Alerts: Yes cellular tissue based products placed in the center Patient Alerts: NOT Diabetic since last visit: Has Dressing in Place as Prescribed: Yes Pain Present Now: Yes Electronic Signature(s) Signed: 10/17/2019 4:19:28 PM By: [name] [name], [name], [name] Entered By: [name] on 10/17/2019 08:12:14 [name] ([id number]) -------------------------------------------------------------------------------- Clinic Level of Care Assessment Details Patient Name: [name] Date of Service: 10/17/2019 8:00 AM Medical Record Number: [id number] Patient Account Number: [id number] Date of Birth/Sex: [birth year] (68 y.o. M) Treating RN: [name] Primary Care [name]: [name] Other Clinician: Referring [name]: [name] [name],  [name] in Treatment: 98 Clinic Level of Care Assessment Items TOOL 4 Quantity Score []  - Use when only an EandM is performed on FOLLOW-UP visit 0 ASSESSMENTS - Nursing Assessment / Reassessment X - Reassessment of Co-morbidities (includes updates in patient status) 1 10 X- 1 5 Reassessment of Adherence to Treatment Plan ASSESSMENTS - Wound and Skin Assessment / Reassessment X - Simple Wound Assessment / Reassessment - one wound 1 5 []  - 0 Complex Wound Assessment / Reassessment - multiple wounds []  - 0 Dermatologic / Skin Assessment (not related to wound area) ASSESSMENTS - Focused Assessment []  - Circumferential Edema Measurements - multi extremities 0 []  - 0 Nutritional Assessment / Counseling / Intervention []  - 0 Lower Extremity Assessment (monofilament, tuning fork, pulses) []  - 0 Peripheral Arterial Disease Assessment (using hand held doppler) ASSESSMENTS - Ostomy and/or Continence Assessment and Care []  - Incontinence Assessment and Management 0 []  - 0 Ostomy Care Assessment and Management (repouching, etc.) PROCESS - Coordination of Care X - Simple Patient / Family Education for ongoing care 1 15 []  - 0 Complex (extensive) Patient / Family Education for ongoing care []  - 0 Staff obtains Programmer, systems, Records, Test Results / Process Orders []  - 0 Staff telephones HHA, Nursing Homes / Clarify orders / etc []  - 0 Routine Transfer to another Facility (non-emergent condition) []  - 0 Routine Hospital Admission (non-emergent condition) []  - 0 New Admissions / Biomedical engineer / Ordering NPWT, Apligraf, etc. []  - 0 Emergency Hospital Admission (emergent condition) X- 1 10 Simple Discharge Coordination []  - 0 Complex (extensive) Discharge Coordination PROCESS - Special Needs []  - Pediatric / Minor Patient Management 0 []  - 0 Isolation Patient Management []  - 0 Hearing / Language / Visual special needs []  - 0 Assessment of Community assistance  (transportation, D/C planning, etc.) [name]. ([id number]) []  - 0 Additional assistance / Altered mentation []  - 0 Support Surface(s) Assessment (bed, cushion, seat, etc.) INTERVENTIONS - Wound Cleansing / Measurement X - Simple Wound Cleansing -  one wound 1 5 []  - 0 Complex Wound Cleansing - multiple wounds X- 1 5 Wound Imaging (photographs - any number of wounds) []  - 0 Wound Tracing (instead of photographs) X- 1 5 Simple Wound Measurement - one wound []  - 0 Complex Wound Measurement - multiple wounds INTERVENTIONS - Wound Dressings []  - Small Wound Dressing one or multiple wounds 0 X- 1 15 Medium Wound Dressing one or multiple wounds []  - 0 Large Wound Dressing one or multiple wounds []  - 0 Application of Medications - topical []  - 0 Application of Medications - injection INTERVENTIONS - Miscellaneous []  - External ear exam 0 []  - 0 Specimen Collection (cultures, biopsies, blood, body fluids, etc.) []  - 0 Specimen(s) / Culture(s) sent or taken to Lab for analysis []  - 0 Patient Transfer (multiple staff / Civil Service fast streamer / Similar devices) []  - 0 Simple Staple / Suture removal (25 or less) []  - 0 Complex Staple / Suture removal (26 or more) []  - 0 Hypo / Hyperglycemic Management (close monitor of Blood Glucose) []  - 0 Ankle / Brachial Index (ABI) - do not check if billed separately X- 1 5 Vital Signs Has the patient been seen at the hospital within the last three years: Yes Total Score: 80 Level Of Care: New/Established - Level 3 Electronic Signature(s) Signed: 10/17/2019 9:06:41 AM By: [name] Entered By: [name] on 10/17/2019 08:38:52 [name], [name] ([id number]) -------------------------------------------------------------------------------- Encounter Discharge Information Details Patient Name: [name] Date of Service: 10/17/2019 8:00 AM Medical Record Number: [id number] Patient Account Number: [id number] Date of Birth/Sex:  05-Nov-1950 (68 y.o. M) Treating RN: [name] Primary Care [name]: [name] Other Clinician: Referring [name]: [name] [name] [name] in Treatment: 78 Encounter Discharge Information Items Discharge Condition: Stable Ambulatory Status: Wheelchair Discharge Destination: Home Transportation: Private Auto Accompanied By: wife Schedule Follow-up Appointment: Yes Clinical Summary of Care: Electronic Signature(s) Signed: 10/17/2019 9:06:41 AM By: [name] Entered By: [name] on 10/17/2019 08:39:36 OSSIE, SEBO ([id number]) -------------------------------------------------------------------------------- Lower Extremity Assessment Details Patient Name: [name] Date of Service: 10/17/2019 8:00 AM Medical Record Number: [id number] Patient Account Number: [id number] Date of Birth/Sex: June 12, 1951 (68 y.o. M) Treating RN: Montey Hora Primary Care Oiva Dibari: [name] Other Clinician: Referring Madell Heino: [name] Treating Hancel Ion/Extender: [name] [name] in Treatment: 98 Electronic Signature(s) Signed: 10/17/2019 4:17:26 PM By: Montey Hora Entered By: Montey Hora on 10/17/2019 08:17:45 Wanner, [name] ([id number]) -------------------------------------------------------------------------------- Multi Wound Chart Details Patient Name: [name] Date of Service: 10/17/2019 8:00 AM Medical Record Number: [id number] Patient Account Number: [id number] Date of Birth/Sex: [birth year] (68 y.o. M) Treating RN: [name] Primary Care Rudolf Blizard: [name] Other Clinician: Referring Crislyn Willbanks: [name] Treating Caydn Justen/Extender: [name] [name] in Treatment: 98 Vital Signs Height(in): 73 Pulse(bpm): 57 Weight(lbs): 210 Blood Pressure(mmHg): 131/74 Body Mass Index(BMI): 28 Temperature(F): 98.2 Respiratory Rate(breaths/min): 16 Photos:  [N/A:N/A] Wound Location: Right Gluteal fold N/A N/A Wounding Event: Pressure Injury N/A N/A Primary Etiology: Pressure Ulcer N/A N/A Comorbid History: Cataracts, Hypertension, Paraplegia N/A N/A Date Acquired: 11/02/2017 N/A N/A [name] of Treatment: 98 N/A N/A Wound Status: Open N/A N/A Measurements L x W x D (cm) 3.7x1.9x2.8 N/A N/A Area (cm) : 5.521 N/A N/A Volume (cm) : 15.46 N/A N/A % Reduction in Area: 45.10% N/A N/A % Reduction in Volume: -1438.30% N/A N/A Position 1 (o'clock): 12 Maximum Distance 1 (cm): 4 Tunneling: Yes N/A N/A Classification: Category/Stage IV N/A N/A Exudate Amount: Large N/A  N/A Exudate Type: Serous N/A N/A Exudate Color: amber N/A N/A Wound Margin: Epibole N/A N/A Granulation Amount: Large (67-100%) N/A N/A Granulation Quality: Pink, Hyper-granulation N/A N/A Necrotic Amount: Small (1-33%) N/A N/A Exposed Structures: Fat Layer (Subcutaneous Tissue) N/A N/A Exposed: Yes Fascia: No Tendon: No Muscle: No Joint: No Bone: No Epithelialization: Small (1-33%) N/A N/A Treatment Notes Electronic Signature(s) Signed: 10/17/2019 9:06:41 AM By: Tharon Aquas ([id number]) Entered By: [name] on 10/17/2019 08:35:47 DOLLIE, STEEG ([id number]) -------------------------------------------------------------------------------- Multi-Disciplinary Care Plan Details Patient Name: [name] Date of Service: 10/17/2019 8:00 AM Medical Record Number: [id number] Patient Account Number: [id number] Date of Birth/Sex: 1951/01/05 (68 y.o. M) Treating RN: [name] Primary Care Lulla Linville: [name] Other Clinician: Referring Ferris Tally: [name] Treating Danashia Landers/Extender: [name] [name] in Treatment: 98 Active Inactive Osteomyelitis Nursing Diagnoses: Infection: osteomyelitis Goals: Patient's osteomyelitis will resolve Date Initiated: 08/29/2019 Target Resolution Date: 09/28/2019 Goal Status:  Active Signs and symptoms for osteomyelitis will be recognized and promptly addressed Date Initiated: 08/29/2019 Target Resolution Date: 09/28/2019 Goal Status: Active Interventions: Assess for signs and symptoms of osteomyelitis resolution every visit Treatment Activities: Systemic antibiotics : 08/29/2019 Notes: Pressure Nursing Diagnoses: Knowledge deficit related to management of pressures ulcers Goals: Patient/caregiver will verbalize understanding of pressure ulcer management Date Initiated: 05/17/2018 Target Resolution Date: 05/28/2018 Goal Status: Active Interventions: Assess: immobility, friction, shearing, incontinence upon admission and as needed Notes: Wound/Skin Impairment Nursing Diagnoses: Impaired tissue integrity Goals: Patient/caregiver will verbalize understanding of skin care regimen Date Initiated: 11/30/2017 Target Resolution Date: 12/21/2017 Goal Status: Active Ulcer/skin breakdown will have a volume reduction of 30% by week 4 Date Initiated: 11/30/2017 Target Resolution Date: 12/21/2017 Goal Status: Active Interventions: PROMETHEUS, CRONEY ([id number]) Assess patient/caregiver ability to obtain necessary supplies Assess patient/caregiver ability to perform ulcer/skin care regimen upon admission and as needed Assess ulceration(s) every visit Treatment Activities: Skin care regimen initiated : 11/30/2017 Notes: Electronic Signature(s) Signed: 10/17/2019 9:06:41 AM By: [name] Entered By: [name] on 10/17/2019 08:35:37 KRIKOR, REECK ([id number]) -------------------------------------------------------------------------------- Pain Assessment Details Patient Name: [name] Date of Service: 10/17/2019 8:00 AM Medical Record Number: [id number] Patient Account Number: [id number] Date of Birth/Sex: Aug 15, 1950 (68 y.o. M) Treating RN: Montey Hora Primary Care Brealynn Contino: [name] Other Clinician: Referring Hinata Diener: [name] Treating Shaunn Tackitt/Extender: [name] [name] in Treatment: 37 Active Problems Location of Pain Severity and Description of Pain Patient Has Paino Yes Site Locations Pain Management and Medication Current Pain Management: Notes c/o hurting when sitting Electronic Signature(s) Signed: 10/17/2019 4:17:26 PM By: Montey Hora Entered By: Montey Hora on 10/17/2019 08:17:35 [name] ([id number]) -------------------------------------------------------------------------------- Patient/Caregiver Education Details Patient Name: [name] Date of Service: 10/17/2019 8:00 AM Medical Record Number: [id number] Patient Account Number: [id number] Date of Birth/Gender: Mar 06, 1951 (68 y.o. M) Treating RN: [name] Primary Care Physician: [name] Other Clinician: Referring Physician: Tracie [name] Treating Physician/Extender: Sharalyn Ink in Treatment: 50 Education Assessment Education Provided To: Patient Education Topics Provided Wound/Skin Impairment: Handouts: Caring for Your Ulcer Methods: Demonstration, Explain/Verbal Responses: State content correctly Electronic Signature(s) Signed: 10/17/2019 9:06:41 AM By: [name] Entered By: [name] on 10/17/2019 08:39:05 [name], [name] ([id number]) -------------------------------------------------------------------------------- Wound Assessment Details Patient Name: [name] Date of Service: 10/17/2019 8:00 AM Medical Record Number: [id number] Patient Account Number: [id number] Date of Birth/Sex: 19-Jan-1951 (68 y.o. M) Treating RN: Montey Hora Primary Care Harmonii Karle: [name] Other Clinician: Referring Sarena Jezek: [name] Treating Verline Kong/Extender: [name]  [name] in Treatment: 98 Wound Status Wound Number: 1 Primary Etiology: Pressure Ulcer Wound Location: Right Gluteal fold Wound Status: Open Wounding Event: Pressure  Injury Comorbid History: Cataracts, Hypertension, Paraplegia Date Acquired: 11/02/2017 [name] Of Treatment: 98 Clustered Wound: No Photos Wound Measurements Length: (cm) 3.7 % R Width: (cm) 1.9 % R Depth: (cm) 2.8 Epi Area: (cm) 5.521 Tu Volume: (cm) 15.46 eduction in Area: 45.1% eduction in Volume: -1438.3% thelialization: Small (1-33%) nneling: Yes Position (o'clock): 12 Maximum Distance: (cm) 4 Undermining: No Wound Description Classification: Category/Stage IV Fou Wound Margin: Epibole Slo Exudate Amount: Large Exudate Type: Serous Exudate Color: amber l Odor After Cleansing: No ugh/Fibrino Yes Wound Bed Granulation Amount: Large (67-100%) Exposed Structure Granulation Quality: Pink, Hyper-granulation Fascia Exposed: No Necrotic Amount: Small (1-33%) Fat Layer (Subcutaneous Tissue) Exposed: Yes Necrotic Quality: Adherent Slough Tendon Exposed: No Muscle Exposed: No Joint Exposed: No Bone Exposed: No Treatment Notes Wound #1 (Right Gluteal fold) Notes S.cell, xsorb, tegaderm JASMON, SETTLE ([id number]) Electronic Signature(s) Signed: 10/17/2019 4:17:26 PM By: Montey Hora Entered By: Montey Hora on 10/17/2019 08:24:53 [name] ([id number]) -------------------------------------------------------------------------------- Vitals Details Patient Name: [name] Date of Service: 10/17/2019 8:00 AM Medical Record Number: [id number] Patient Account Number: [id number] Date of Birth/Sex: Dec 05, 1950 (69 y.o. M) Treating RN: [name] Primary Care Aedin Jeansonne: [name] Other Clinician: Referring Fredrick Dray: [name] Treating Kimori Tartaglia/Extender: [name] [name] in Treatment: 98 Vital Signs Time Taken: 08:10 Temperature (F): 98.2 Height (in): 73 Pulse (bpm): 74 Weight (lbs): 210 Respiratory Rate (breaths/min): 16 Body Mass Index (BMI): 27.7 Blood Pressure (mmHg): 131/74 Reference Range: 80 - 120 mg /  dl Electronic Signature(s) Signed: 10/17/2019 4:19:28 PM By: [name] [name], [name], [name] Entered By: [name] on 10/17/2019 08:15:51

## 2019-10-17 NOTE — Progress Notes (Addendum)
Brett, [name] ([id number]) Visit Report for 10/17/2019 Chief Complaint Document Details Patient Name: [name], [name] Date of Service: 10/17/2019 8:00 AM Medical Record Number: [id number] Patient Account Number: [id number] Date of Birth/Sex: 1950-09-07 (68 y.o. M) Treating RN: Army Melia Primary Care Provider: [name] Other Clinician: Referring Provider: [name] Treating Provider/Extender: [name], Jontavious Commons Weeks in Treatment: 48 Information Obtained from: Patient Chief Complaint Right gluteal fold ulcer Electronic Signature(s) Signed: 10/17/2019 8:29:19 AM By: Worthy Keeler [name] Entered By: Worthy Keeler on 10/17/2019 08:29:19 Brett, [name] ([id number]) -------------------------------------------------------------------------------- HPI Details Patient Name: [name] Date of Service: 10/17/2019 8:00 AM Medical Record Number: [id number] Patient Account Number: [id number] Date of Birth/Sex: 1950/10/27 (68 y.o. 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 He states that there is no history of diabetes that he is aware of. He does have issues with his prostate and is currently receiving treatment for this by way of oral medication. With that being said I do not have a lot of details in that regard. Nonetheless the patient presents today as a result of having been referred to [place] by another provider initially home health was set to come out and take care of his wound although due to the fact that he  apparently drives he's not able to receive home health. His wife is therefore trying to help take care of this wound within although they have been struggling with what exactly to do at this point. She states that she can do some things but she is definitely not a nurse and does have some issues with looking at blood. The good news is the wound does not appear to be too deep and is fairly superficial at this point. There is no slough noted there is some nonviable skin noted around the surface of the wound and the perimeter at this point. The central portion of the wound appears to be very good with a dermal layer noted this does not appear to be again deep enough to extend it to subcutaneous tissue at this point. 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 In the pump he has Dilaudid and baclofen. 12/07/17 on evaluation today patient presents for follow-up concerning his ongoing lower back thigh ulcer on the right. He states that he did not get the supplies ordered and therefore has not really been able to perform the dressing changes as directed exactly. His wife was able to get some Boarder Foam Dressing's from the drugstore and subsequently has been using hydrogel which did help to a degree in the wound does appear to be able smaller. There is actually more drainage this week noted than previous. 12/21/17 on evaluation today patient appears to be doing rather well in regard to his right gluteal ulcer. He has been tolerating the dressing changes without complication. There does not appear to be any evidence of infection at this point in time. Overall the wound does seem to be making some progress as far as the edges  are concerned there's not as much in the way of overlapping of the external wound edges and he has a good epithelium to wound bed border for the most part. This however is not true right  at the 12 o'clock location over the span of a little over a centimeters which actually will require debridement today to clean this away and hopefully allow it to continue to heal more appropriately. 12/28/17 on evaluation today patient appears to be doing rather well in regard to his ulcer in the left gluteal region. He's been tolerating the dressing changes without complication. Apparently he has had some difficulty getting his dressing material. Apparently there's been some confusion with ordering we're gonna check into this. Nonetheless overall he's been showing signs of improvement which is good news. Debridement is not required today. 01/04/18 on evaluation today patient presents for follow-up concerning his right gluteal ulcer. He has been tolerating the dressing changes fairly well. On inspection today it appears he may actually have some maceration them concerned about the fact that he may be developing too much moisture in and around the wound bed which can cause delay in healing. With that being said he unfortunately really has not showed significant signs of improvement since last week's evaluation in fact this may even be just the little bit/slightly larger. Nonetheless he's been having a lot of discomfort I'm not sure this is even related to the wound as he has no pain when I'm to breeding or otherwise cleaning the wound during evaluation today. Nonetheless this is something that we did recommend he talked to his pain specialist concerning. 01/11/18 on evaluation today patient appears to be doing better in regard to his ulceration. He has been tolerating the dressing changes without complication. With that being said overall there's no evidence of infection which is good news. The only thing is he did receive the hatch affair blue classic versus the ready nonetheless I feel like this is perfectly fine and appears to have done well for him over the past week. 01/25/18 on evaluation today  patient's wound actually appears to be a little bit larger than during the last evaluation. The good news is the majority of the wound edges actually appear to be fairly firmly attached to the wound bed unfortunately again we're not really making progress in regard to the size. Roughly the wound is about the same size as when I first saw him although again the wound margin/edges appear to be much better. 02/01/18 on evaluation today patient actually appears to be doing very well in regard to his wound. Applying the Prisma dry does seem to be better although he does still have issues with slow progression of the wound. There was a slight improvement compared to last week's measurements today. Nonetheless I have been considering other options as far as the possibility of Theraskin or even a snap vac. In general I'm not sure that the Theraskin due to location of the wound would be a very good idea. Nonetheless I do think that a snap vac could be a possibility for the patient and in fact I think this could even be an excellent way to manage the wound possibly seeing some improvement in a very rapid fashion here. Nonetheless this is something that we would need to get approved and I did have a lengthy conversation with the patient about this today. 02/08/18 on evaluation today patient appears to be doing a little better in regard to his ulcer. He has been tolerating  the dressing changes without complication. Fortunately despite the fact that the wound is a little bit smaller it's not significantly so unfortunately. We have discussed the possibility of a snap vac we did check with insurance this is actually covered at this point. Fortunately there does not appear to be any sign of infection. Overall I'm fairly pleased with how things seem to be appearing at this point. 02/15/18 on evaluation today patient appears to be doing rather well in regard to his right gluteal ulcer. Unfortunately the snap vac did not  stay in place with his sheer and friction this came loose and did not seem to maintain seal very well. He worked for about two days and it did seem to do very well during that time according to his wife but in general this does not seem to be something that's gonna be beneficial for him long-term. I do believe we TOMMIE, JAVENS. ([id number]) need to go back to standard dressings to see if we can find something that will be of benefit. 03/02/18- He is here in follow up evaluation; there is minimal change in the wound. He will continue with the same treatment plan, would consider changing to iodosrob/iodoflex if ulcer continues to to plateau. He will follow up next week 03/08/18 on evaluation today patient's wound actually appears to be about the same size as when I previously saw him several weeks back. Unfortunately he does have some slightly dark discoloration in the central portion of the wound which has me concerned about pressure injury. I do believe he may be sitting for too long a period of time in fact he tells me that "I probably sit for much too long". He does have some Slough noted on the surface of the wound and again as far as the size of the wound is concerned I'm really not seeing anything that seems to have improved significantly. 03/15/18 on evaluation today patient appears to be doing fairly well in regard to his ulcer. The wound measured pretty much about the same today compared to last week's evaluation when looking at his graph. With that being said the area of bruising/deep tissue injury that was noted last week I do not see at this point. He did get a new cushion fortunately this does seem to be have been of benefit in my pinion. It does appear that he's been off of this more which is good news as well I think that is definitely showing in the overall wound measurements. With that being said I do believe that he needs to continue to offload I don't think that the fact this is  doing better should be or is going to allow him to not have to offload and explain this to him as well. Overall he seems to be in agreement the plan I think he understands. The overall appearance of the wound bed is improved compared to last week I think the Iodoflex has been beneficial in that regard. 03/29/18 on evaluation today patient actually appears to be doing rather well in regard to his wound from the overall appearance standpoint he does have some granulation although there's some Slough on the surface of the wound noted as well. With that being said he unfortunately has not improved in regard to the overall measurement of the wound in volume or in size. I did have a discussion with him very specifically about offloading today. He actually does work although he mainly is just sitting throughout the day. He tells me  he offloads by "lifting himself up for 30 seconds off of his chair occasionally" purchase from [employer] which does seem to have helped. And he has a new cushion that he with that being said he's also able to stand some for a very short period of time but not significant enough I think to provide appropriate offloading. I think the biggest issue at this point with the wound and the fact is not healing as quickly as we would like is due to the fact that he is really not able to appropriately offload while at work. He states the beginning after his injury he actually had a bed at his job that he could lay on in order to offload and that does seem to have been of help back at that time. Nonetheless he had not done this in quite some time unfortunately. I think that could be helpful for him this is something I would like for him to look into. 04/05/18 on evaluation today patient actually presents for follow-up concerning his right gluteal ulcer. Again he really is not significantly improved even compared to last week. He has been tolerating the dressing changes without  complication. With that being said fortunately there appears to be no evidence of infection at this time. He has been more proactive in trying to offload. 04/12/18 on evaluation today patient actually appears to be doing a little better in regard to his wound and the right gluteal fold region. He's been tolerating the dressing changes since removing the oasis without complication. However he was having a lot of burning initially with the oasis in place. He's unsure of exactly why this was given so much discomfort but he assumes that it was the oasis itself causing the problem. Nonetheless this had to be removed after about three days in place although even those three days seem to have made a fairly good improvement in regard to the overall appearance of the wound bed. In fact is the first time that he's made any improvement from the standpoint of measurements in about six weeks. He continues to have no discomfort over the area of the wound itself which leads me to wonder why he was having the burning with the oasis when he does not even feel the actual debridement's themselves. I am somewhat perplexed by this. 04/19/18 on evaluation today patient's wound actually appears to be showing signs of epithelialization around the edge of the wound and in general actually appears to be doing better which is good news. He did have the same burning after about three days with applying the Endoform last week in the same fashion that I would generally apply a skin substitute. This seems to indicate that it's not the oasis to cause the problem but potentially the moisture buildup that just causes things to burn or there may be some other reaction with the skin prep or Steri-Strips. Nonetheless I'm not sure that is gonna be able to tolerate any skin substitute for a long period of time. The good news is the wound actually appears to be doing better today compared to last week and does seem to finally be making some  progress. 04/26/18 on evaluation today patient actually appears to be doing rather well in regard to his ulcer in the right gluteal fold. He has been tolerating the dressing changes without complication which is good news. The Endoform does seem to be helping although he was a little bit more macerated this week. This seems to be an  ongoing issue with fluid control at this point. Nonetheless I think we may be able to add something like Drawtex to help control the drainage. 05/03/18 on evaluation today patient appears to actually be doing better in regard to the overall appearance of his wound. He has been tolerating the dressing changes without complication. Fortunately there appears to be no evidence of infection at this time. I really feel like his wound has shown signs as of today of turning around last week I thought so as well and definitely he could be seen in this week's overall appearance and measurements. In general I'm very pleased with the fact that he finally seems to be making a steady but sure progress. The patient likewise is very pleased. 05/17/18 on evaluation today patient appears to be doing more poorly unfortunately in regard to his ulcer. He has been tolerating the dressing changes without complication. With that being said he tells me that in the past couple of days he and his wife have noticed that we did not seem to be doing quite as well is getting dark near the center. Subsequently upon evaluation today the wound actually does appear to be doing worse compared to previous. He has been tolerating the dressing changes otherwise and he states that he is not been sitting up anymore than he was in the past from what he tells me. Still he has continued to work he states "I'm tired of dealing with this and if I have to just go home and lay in the bed all the time that's what I'll do". Nonetheless I am concerned about the fact that this wound does appear to be deeper than what it was  previous. 05/24/18 upon evaluation today patient actually presents after having been in the hospital due to what was presumed to be sepsis secondary to the wound infection. He had an elevated white blood cell count between 14 and 15. With that being said he does seem to be doing somewhat better now. His wound still is giving him some trouble nonetheless and he is obviously concerned about the fact likely talked about that this does seem to go more deeply than previously noted. I did review his wound culture which showed evidence of Staphylococcus aureus him and group B strep. Nonetheless he is on antibiotics, Levaquin, for this. Subsequently I did review his intake summary from the hospital as well. I also did look at the CT of the lumbar spine with contrast that was performed which showed no bone destruction to suggest lumbar disguises/osteomyelitis or sacral osteomyelitis. There was no paraspinal abscess. Nonetheless it appears this may have been more of just a soft tissue infection at this point which is good news. He still is TAHJEE, MONDAY. ([id number]) nonetheless concerned about the wound which again I think is completely reasonable considering everything he's been through recently. 05/31/18 on evaluation today on evaluation today patient actually appears to be showing signs of his wound be a little bit deeper than what I would like to see. Fortunately he does not show any signs of significant infection although his temperature was 99 today he states he's been checking this at home and has not been elevated. Nonetheless with the undermining that I'm seeing at this point I am becoming more concerned about the wound I do think that offloading is a key factor here that is preventing the speedy recovery at this point. There does not appear to be any evidence of again over infection noted. He's been using  Santyl currently. 06/07/18 the patient presents today for follow-up evaluation regarding the  left ulcer in the gluteal region. He has been tolerating the Wound VAC fairly well. He is obviously very frustrated with this he states that to mean is really getting in his way. There does not appear to be any evidence of infection at this time he does have a little bit of odor I do not necessarily associate this with infection just something that we sometimes notice with Wound VAC therapy. With that being said I can definitely catch a tone of discontentment overall in the patient's demeanor today. This when he was previously in the hospital an CT scan was done of the lumbar region which did not reveal any signs of osteomyelitis. With that being said the pelvis in particular was not evaluated distinctly which means he could still have some osteonecrosis I. Nonetheless the Wound VAC was started on Thursday I do want to get this little bit more time before jumping to a CT scan of the pelvis although that is something that I might would recommend if were not see an improvement by that time. 06/14/18 on evaluation today patient actually appears to be doing about the same in regard to his right gluteal ulcer. Again he did have a CT scan of the lumbar spine unfortunately this did not include the pelvis. Nonetheless with the depth of the wound that I'm seeing today even despite the fact that I'm not seeing any evidence of overt cellulitis I believe there's a good chance that we may be dealing with osteomyelitis somewhere in the right Ischial region. No fevers, chills, nausea, or vomiting noted at this time. 06/21/18 on evaluation today patient actually appears to be doing about the same with regard to his wound. The tunnel at 6 o'clock really does not appear to be any deeper although it is a little bit wider. I think at this point you may want to start packing this with white phone. Unfortunately I have not got approval for the CT scan of the pelvis as of yet due to the fact that Medicare apparently has been  denied it due to the diagnosis codes not being appropriate according to Medicare for the test requested. With that being said the patient cannot have an MRI and therefore this is the only option that we have as far as testing is concerned. The patient has had infection and was on antibiotics and been added code for cellulitis of the bottom to see if this will be appropriate for getting the test approved. Nonetheless I'm concerned about the infection have been spread deeper into the Ischial region. 06/28/18 on evaluation today patient actually appears to be doing rather well all things considered in regard to the right gluteal ulcer. He has been tolerating the dressing changes without complication. With that being said the Wound VAC he states does have to be replaced almost every day or at least reinforced unfortunately. Patient actually has his CT scan later this morning we should have the results by tomorrow. 07/05/18 on evaluation today patient presents for follow-up concerning his right Ischial ulcer. He did see the surgeon Dr. Lysle Pearl last week. They were actually very happy with him and felt like he spent a tremendous amount of time with them as far as discussing his situation was concerned. In the end Dr. Lysle Pearl did contact me as well and determine that he would not recommend any surgical intervention at this point as he felt like it would not be  in the patient's best interest based on what he was seeing. He recommended a referral to infectious disease. Subsequently this is something that Dr. Ines Bloomer office is working on setting up for the patient. As far as evaluation today is concerned the patient's wound actually appears to be worse at this point. I am concerned about how things are progressing and specifically about infection. I do not feel like it's the deeper but the area of depth is definitely widening which does have me concerned. No fevers, chills, nausea, or vomiting noted at this time. I  think that we do need initiate antibiotic therapy the patient has an allow allergy to amoxicillin/penicillin he states that he gets a rash since childhood. Nonetheless she's never had the issues with Catholics or cephalosporins in general but he is aware of. 07/27/18 on evaluation today patient presents following admission to the hospital on 07/09/18. He was subsequently discharged on 07/20/18. On 07/15/18 the patient underwent irrigation and debridement was soft tissue biopsy and bone biopsy as well as placement of a Wound VAC in the OR by Dr. Celine Ahr. During the hospital course the patient was placed on a Wound VAC and recommended follow up with surgery in three weeks actually with Dr. Delaine Lame who is infectious disease. The patient was on vancomycin during the hospital course. 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 He also had a bone culture which revealed evidence of methicillin-resistant staph aureus. He is updated CT scan 07/09/18 reveals that he had progression of the which was performed on wound to breakdown down to the trochanter where he actually had irregularities there as well suggestive of osteomyelitis. This was a change just since 9 December when we last performed a CT scan. Obviously this one had gone downhill quite significantly and rapidly. At this point upon evaluation I feel like in general the patient's wound seems to be doing fairly well all things considered upon my evaluation today. Obviously this is larger and deeper than what I previously evaluated but at the same time he seems to be making some progress as far as the appearance of the granulation tissue is concerned. I'm happy in that regard. No fevers, chills, nausea, or vomiting noted at this time. He is on IV vancomycin and Rocephin at the facility. He is currently in [employer]. 08/03/18 upon evaluation today patient's wound appears to be doing better in regard to the overall  appearance at this point in time. Fortunately he's been tolerating the Wound VAC without complication and states that the facility has been taking excellent care of the wound site. Overall I see some Slough noted on the surface which I am going to attempt sharp debridement today of but nonetheless other than this I feel like he's making progress. 08/09/18 on evaluation today patient's wound appears to be doing much better compared to even last week's evaluation. Do believe that the Wound VAC is been of great benefit for him. He has been tolerating the dressing changes that is the Wound VAC without any complication and he has excellent granulation noted currently. There is no need for sharp debridement at this point. 08/16/18 on evaluation today patient actually appears to be doing very well in regard to the wound in the right gluteal fold region. This is showing signs of progress and again appears to be very healthy which is excellent news. Fortunately there is no sign of active infection by way of odor or drainage at this point. Overall I'm very  pleased with how things stand. He seems to be tolerating the Wound VAC without complication. 08/23/18 on evaluation today patient actually appears to be doing better in regard to his wound. He has been tolerating the Wound VAC without complication and in fact it has been collecting a significant amount of drainage which I think is good news especially considering how the wound appears. Fortunately there is no signs of infection at this time definitely nothing appears to be worse which is good news. He has not been started on [place]. ([id number]) the Bactrim and Flagyl that was recommended by Dr. Delaine Lame yet. I did actually contact her office this morning in order to check and see were things are that regard their gonna be calling me back. 08/30/18 on evaluation today patient actually appears to show signs of excellent improvement today compared to  last evaluation. The undermining is getting much better the wound seems to be feeling quite nicely and I'm very pleased that the granulation in general. With that being said overall I feel like the patient has made excellent progress which is great news. No fevers, chills, nausea, or vomiting noted at this time. 09/06/18 on evaluation today patient actually appears to be doing rather well in regard to his right gluteal ulcer. This is showing signs of improvement in overall I'm very pleased with how things seem to be progressing. The patient likewise is please. Overall I see no evidence of infection he is about to complete his oral antibiotic regimen which is the end of the antibiotics for him in just about three days. 09/13/18 on evaluation today patient's right Ischial ulcer appears to be showing signs of continued improvement which is excellent news. He's been tolerating the dressing changes without complication. Fortunately there's no signs of infection and the wound that seems to be doing very well. 09/28/18 on evaluation today patient appears to be doing rather well in regard to his right Ischial ulcer. He's been tolerating the Wound VAC without complication he knows there's much less drainage than there used to be this obviously is not a bad thing in my pinion. There's no evidence of infection despite the fact is but nothing about it now for several weeks. 10/04/18 on evaluation today patient appears to be doing better in regard to his right Ischial wound. He has been tolerating the Wound VAC without complication and I do believe that the silver nitrate last week was beneficial for him. Fortunately overall there's no evidence of active infection at this time which is great news. No fevers, chills, nausea, or vomiting noted at this time. 10/11/18 on evaluation today patient actually appears to be doing rather well in regard to his Ischial ulcer. He's been tolerating the Wound VAC still without  complication I feel like this is doing a good job. No fevers, chills, nausea, or vomiting noted at this time. 11/01/18 on evaluation today patient presents after having not been seen in our clinic for several weeks secondary to the fact that he was on evaluation today patient presents after having not been seen in our clinic for several weeks secondary to the fact that he was in a skilled nursing facility which was on lockdown currently due to the covert 19 national emergency. Subsequently he was discharged from the facility on this past Friday and subsequently made an appointment to come in to see yesterday. Fortunately there's no signs of active infection at this time which is good news and overall he does seem to have made  progress since I last saw. Overall I feel like things are progressing quite nicely. The patient is having no pain. 11/08/18 on evaluation today patient appears to be doing okay in regard to his right gluteal ulcer. He has been utilizing a Wound VAC home health this changing this at this point since he's home from the skilled nursing facility. Fortunately there's no signs of obvious active infection at this time. Unfortunately though there's no obvious active infection he is having some maceration and his wife states that when the sheets of the Wound VAC office on Sunday when it broke seal that he ended up having significant issues with some smell as well there concerned about the possibility of infection. Fortunately there's No fevers, chills, nausea, or vomiting noted at this time. 11/15/18 on evaluation today patient actually appears to be doing well in regard to his right gluteal ulcer. He has been tolerating the dressing changes without complication. Specifically the Wound VAC has been utilized up to this point. Fortunately there's no signs of infection and overall I feel like he has made progress even since last week when I last saw him. I'm actually fairly happy with the overall  appearance although he does seem to have somewhat of a hyper granular overgrowth in the central portion of the wound which I think may require some sharp debridement to try flatness out possibly utilizing chemical cauterization following. 11/23/18 on evaluation today patient actually appears to be doing very well in regard to his sacral ulcer. He seems to be showing signs of improvement with good granulation. With that being said he still has the small area of hyper granulation right in the central portion of the wound which I'm gonna likely utilize silver nitrate on today. Subsequently he also keeps having a leak at the 6 o'clock location which is unfortunate we may be able to help out with some suggestions to try to prevent this going forward. Fortunately there's no signs of active infection at this time. 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 He's been tolerating the dressing changes without complication. Fortunately there's no signs of active infection at this time. I've been rather pleased with how things have progressed there still some evidence of pressure getting to the area with some redness right around the immediate wound opening. Nonetheless other than this I'm not seeing any significant complications or issues the wound is somewhat hyper granular. Upon discussing with the patient and his wife today I'm not sure that the wound is being packed to the base with the foam at this point. And if it's not been packed fully that may be part of the reason why is not seen as much improvement as far as the granulation from the base out. Again we do not want pack too tightly but we need some of the firm to get to the base of the wound. I discussed this with patient and his wife today. 12/06/18 on evaluation today patient appears to be doing well in regard to his right gluteal pressure ulcer. He's been  tolerating the dressing changes without complication. Fortunately there's no signs of active infection. He still has some hyper granular tissue and I do think it would be appropriate to continue with the chemical cauterization as of today. 12/16/18 on evaluation today patient actually appears to be doing okay in regard to his right gluteal ulcer. He is been tolerating the dressing  changes without complication including the Wound VAC. Overall I feel like nothing seems to be worsening I do feel like that the hyper granulation buds in the central portion of the wound have improved to some degree with the silver nitrate. We will have to see how things continue to progress. 12/20/18 on evaluation today patient actually appears to be doing much worse in my pinion even compared to last week's evaluation. Unfortunately as opposed to showing any signs of improvement the areas of hyper granular tissue in the central portion of the wound seem to be getting worse. Subsequently the wound bed itself also seems to be getting deeper even compared to last week which is both unusual as well as concerning since prior he had been shown signs of improvement. Nonetheless I think that the issue could be that he's actually having some difficulty in issues with a deeper infection. There's no external signs of infection but nonetheless I am more worried about the internal, osteomyelitis, that could be restarting. He has not been on antibiotics for some time at this point. I think that it may be a good idea to go ahead and started back on an antibiotic therapy while we wait to see what the testing shows. KEYON, SADA ([id number]) 12/27/18 on evaluation today patient presents for follow-up concerning his left gluteal fold wound. Fortunately he appears to be doing well today. I did review the CT scan which was negative for any signs of osteomyelitis or acute abnormality this is excellent news. Overall I feel like the surface of  the wound bed appears to be doing significantly better today compared to previously noted findings. There does not appear any signs of infection nor does he have any pain at this time. 01/03/19 on evaluation today patient actually appears to be doing quite well in regard to his ulcer. Post debridement last week he really did not have too much bleeding which is good news. Fortunately today this seems to be doing some better but we still has some of the hyper granular tissue noted in the base of the wound which is gonna require sharp debridement today as well. Overall I'm pleased with how things seem to be progressing since we switched away from the Wound VAC I think he is making some progress. 01/10/19 on evaluation today patient appears to be doing better in regard to his right gluteal fold ulcer. He has been tolerating the dressing changes without complication. The debridement to seem to be helping with current away some of the poor hyper granular tissue bugs throughout the region of his gluteal fold wound. He's been tolerating the dressing changes otherwise without complication which is great news. No fevers, chills, nausea, or vomiting noted at this time. 01/17/19 on evaluation today patient actually appears to be doing excellent in regard to his wound. He's been tolerating the dressing changes without complication. Fortunately there is no signs of active infection at this time which is great news. No fevers, chills, nausea, or vomiting noted at this time. 01/24/19 on evaluation today patient actually appears to be doing quite well with regard to his ulcer. He has been tolerating the dressing changes without complication. Fortunately there's no signs of active infection at this time. Overall been very pleased with the progress that he seems to be making currently. 01/31/19-Patient returns at 1 week with apparent similarity in dimensions to the wound, with no signs of infection, he has been changing  dressings twice a day 02/08/19 upon evaluation today patient actually  appears to be doing well with regard to his right Ischial ulcer. The wound is not appear to be quite as deep and seems to be making progress which is good news. With that being said I'm still reluctant to go back to the Wound VAC at this point. He's been having to change the dressings twice a day which is a little bit much in my pinion from the wound care supplies standpoint. I think that possibly attempting to utilize extras orbit may be beneficial this may also help to prevent any additional breakdown secondary to fluid retention in the wound itself. The patient is in agreement with giving this a try. 02/15/19 on evaluation today patient actually appears to be doing decently well with regard to his ulcer in the right to gluteal fold location. He's been tolerating the dressing changes without complication. Fortunately there is no signs of active infection at this time. He is able to keep the current dressing in place more effectively for a day at a time whereas before he was having a changes to to three times a day. The actions or has been helpful in this regard. Fortunately there's no signs of anything getting worse and I do feel like he showing signs of good improvement with regard to the wound bed status. 02/22/2019 patient appears to be doing very well today with regard to his ulcer in the gluteal fold. Fortunately there is no signs of active infection and he has been tolerating the dressing changes without any complication. Overall extremely pleased with how things seem to be progressing. He has much less of the hyper granular projections within the wound these have slowly been debrided away and he seems to be doing well. The wound bed is more uniform. 03/01/19 on evaluation today patient appears to be doing unfortunately about the same in regard to his gluteal ulcer. He's been tolerating the dressing changes without  complication. Fortunately there's no signs of active infection at this time. With that being said he continues to develop these hyper granular projections which I'm unsure of exactly what they are and why they are rising. Nonetheless I explained to the patient that I do believe it would be a good idea for [place] to stand a biopsy sample for pathology to see if that can shed any light on what exactly may be going on here. Fortunately I do not see any obvious signs of infection. With that being said the patient has had a little bit more drainage this week apparently compared to last week. 03/08/2019 on evaluation today patient actually appears to look somewhat better with regard to the appearance of his wound bed at this time. This is good news. Overall I am very pleased with how things seem to have progressed just in the past week with a switch to the [facility] dressing. I think that has been beneficial for him. With that being said at this time the patient is concerned about his biopsy that I sent off last week unfortunately I do not have that report as of yet. Nonetheless we have called to obtain this and hopefully will hear back from the lab later this morning. 03/15/19 on evaluation today patient's wound actually appears to be doing okay today with regard to the overall appearance of the wound bed. He has been tolerating the dressing changes without complication he still has hyper granular tissue noted but fortunately that seems to be minimal at this point compared to some of what we've seen in the  past. Nonetheless I do think that he is still having some issues currently with some of this type of granulation the biopsy and since all showed nothing more than just evidence of granulation tissue. Therefore there really is nothing different to initiate or do at this point. 03/24/19 on evaluation today patient appears to be doing a little better with regard to his ulcer. He's been tolerating the dressing  changes without complication. Fortunately there is no signs of active infection at this time. No fevers, chills, nausea, or vomiting noted at this time. I'm overall pleased with how things seem to be progressing. 03/29/2019 on evaluation today patient appears to be doing about the same in regard to his ulcer in the right gluteal fold. Unfortunately he is not seeming to make a lot of progress and the wound is somewhat stalled. There is no signs of active infection externally but I am concerned about the possibility of infection continuing in the ischial location which previously he did have infection noted. Again is not able to have an MRI so probably our best option for testing for this would be a triple phase bone scan which will detect subtle changes in the bone more so than plain film x-rays. In the past they really have not been beneficial and in fact the CT scans even have been somewhat questionable at times. Nonetheless there is no signs of systemic infection which is at least good news but again his wound is not healing at all on the predicted schedule. 04/05/19 on evaluation today patient appears to be doing well all things considering with regard to his wound and the right gluteal fold. He actually has his triple bone scan scheduled for sometime in the next couple of days. With that being said I've also been looking to other possibilities of what could be causing hyper granular tissue were looking into the bone scan again for evaluating for the risk or possibility of infection deeper and I'm also ZEANDRE, ZELINSKI. ([id number]) go ahead and see about obtaining a deep tissue culture today to send and see if there's any evidence of infection noted on culture. He's in agreement with that plan. 04/12/2019 on evaluation today patient presents for reevaluation here in our clinic concerning ongoing issues with his right gluteal fold ulcer. 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 It was discussed with him at that point that I think it would be appropriate for him to proceed with hyperbaric oxygen therapy especially in light of the fact that he is previously been on IV antibiotics at the beginning of the year for close to 3 months followed by several weeks of oral antibiotics that was all prescribed by infectious disease. He had surgical debridement around Christmas December 2019 due to an abscess and osteomyelitis of the ischial bone. Unfortunately this has not really proceeded as well as we would have liked and again we did a CT scan even a couple months ago as he cannot have an MRI secondary to having issues with both a pain pump as well as a spinal cord stimulator which prevent him from going into an MRI machine. With that being said there were chronic changes noted at the ischial tuberosity which had progressed since December 2019 there was no evidence of fluid collection on that initial CT scan. With that being said that was on January 21, 2019. I am  not sure that it was a sensitive enough test as compared to an MRI and then subsequently I ordered a bone scan for him which was actually completed on 03/29/2019 and this revealed that he does indeed have positive osteomyelitis involving the right ischial tuberosity. This is adjacent to the ulcer and I think is the reason that his ulcer is not healing. Subsequently I am in a place him back on oral antibiotics today unfortunately his wound culture showed just mixed gram-negative flora with no specific findings of a predominant organism. Nonetheless being with the location I think a good broad-spectrum antibiotic for gram-negative's is a good choice at this point he is previously taken Levaquin without significant issues and I think that is an appropriate antibiotic for him at this time. 04/19/2019 on evaluation today patient  actually appears to be doing fairly well with regard to his wound. He has been tolerating the dressing changes without complication. Fortunately there is no signs of active infection and he has been taking the oral antibiotics at this time. Subsequently we did make a referral to infectious disease although Dr. Steva Ready wants the patient to be seen by general surgery first in order apparently to see if there is anything from a surgical standpoint that should be done prior to initiation of IV antibiotic therapy. Again the patient is okay with actually seeing Dr. Celine Ahr whom he has seen before we will make that referral. 04/26/2019 on evaluation today patient actually appears to be doing well with regard to his wound. He has been tolerating the dressing changes without complication. Fortunately there is no signs of active infection at this time. I do believe that the hyperbaric oxygen therapy along with the antibiotics which I prescribed at this point have been doing well for him. With that being said he has not seen Dr. Celine Ahr the surgeon yet in fact they have not been contacted for scheduling an appointment as of yet. Subsequently the patient is not on antibiotics currently by IV Dr. Steva Ready did want him to see Dr. Celine Ahr first which we are working on trying to get scheduled. We again give the information to the patient today for Dr. Celine Ahr and her number as far as contacting their office to see about getting something scheduled. Again were looking for whether or not they recommend any surgical intervention. 05/03/2019 on evaluation today patient appears to be doing about the same with regard to his wound there may be a little bit of filling in in regard to the base of the wound but still he has quite a bit of hyper granular tissue that I am seeing today. I believe that he may may need a more aggressive sharp debridement possibly even by the surgeon or under anesthesia in order to clear away some of  the hyper granular material in order to help allow for appropriate granulation to fill in. We have made a referral to Dr. Celine Ahr unfortunately it sounds as if they did not receive the referral we contacted them today they should be get in touch with the patient. Depending on how things show from that standpoint the patient may need to see Dr. Ardyth Gal who is the infectious disease specialist although he really does not want a PICC line again. No fevers, chills, nausea, vomiting, or diarrhea. He is tolerating hyperbaric oxygen therapy very well. 05/12/2019 on evaluation today patient presents for follow-up visit concerning his right gluteal fold ulcer. He did see Dr. Celine Ahr the surgeon who previously evaluated his wound and  she actually felt like he was doing quite well with regard to the wound based on what she was seen. He does seem to be responding to some degree with the oral antibiotics along with hyperbaric oxygen therapy at this point she did not see any evidence or need for further surgical intervention at this point. She recommended deferring additional or ongoing antibiotic therapy to Dr. Steva Ready at her discretion. Fortunately there is no signs of active systemic infection. No fevers, chills, nausea, vomiting, or diarrhea. 10/29; patient I am seeing really for the first time today in terms of his wound. 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 He is being treated with hyperbaric oxygen for underlying osteomyelitis most recently documented by a three-phase bone scan. He has been on Levaquin for roughly a month and he is out of these and asked me for a refill. Most recent cultures were negative although I do not think these were bone cultures. He has a very irregular surface to this wound which is almost nodular in texture. It undermines superiorly with the same nodular hyper granulated surface. I see that he was referred to general surgery for an operative  debridement although the surgeon did not agree with this I think that would have been the proper course of action in this. He has been using Hydrofera Blue. He is supposed to start a wound VAC tomorrow which is actually a reapplication of wound VAC. I think mostly last time they had trouble keeping the seal in place I hope we have better look at this this time. 05/24/2019 on evaluation today I did review patient's note from Dr. Dellia Nims where he was seen last week when I was out of the office. Subsequently it does appear that Dr. Dellia Nims was in agreement that the patient really needed debridement to clear away some of the nodular tissue in the base of the wound. The good news is he has the wound VAC initiated and some of this tissue is already starting to breakdown under the treatment of the wound VAC again because it is not really viable. Nonetheless I do believe that we can likely perform some debridement today to clear this away and hopefully the continuation of the wound VAC along with hyperbarics and antibiotic therapy will be beneficial in stopping this from reoccurring. If we get better control in the wound bed in a good spot I think we can definitely use the PriMatrix which we actually did get approval for. 05/31/2019 on evaluation today patient appears to be doing actually pretty well at this point with regard to his wound. He has been tolerating the dressing changes which is good news. Fortunately there is no signs of infection. He still has some of the hyper granular nodules noted we have not really cleared all these away and I think we can try to do a little bit more that today. I did do quite a bit last week I am hoping to get down to the base of the wound so though actually be able to heal more appropriately. I think the wound VAC is doing a good job right now with controlling the drainage and overall the patient is happy with that. 06/07/2019 upon evaluation today patient appears to be doing  quite well compared to last week with regard to the hyper granulation in the base of the wound. He has been tolerating the dressing changes without complication. Fortunately there is no signs of active infection at this time. With  that being said we have not had any recent measures as far as blood work is concerned I think we may want to check a few things including a CBC, sed rate, and C-reactive protein. Patient is in agreement with that we can try to get that scheduled for him for this Friday. 06/13/2019 on evaluation today patient actually appears to be doing much better at this point based on what I am seeing with regard to his wound. He BURT, NAREZ. ([id number]) has much less in the way of hyper granular tissue which is good news and a lot more healing that is taken place since I last saw him as far as the amount of space within the wound itself. With that being said he is nearing the completion of the initial 40 treatments for hyperbaric oxygen therapy I think this is something I would recommend extending as he does seem to be benefiting at this time. Fortunately there is no evidence of active systemic infection at this point nor even local infection for that matter. 06/20/2019 upon evaluation today patient actually appears to be doing well with regard to his wound. In fact this appears to be doing much better today compared to what it was previous. I been extremely pleased with the progress that he is made. In fact the tunnel is much less deep today than what it was in the past and I am seeing excellent signs of improvement. Overall I am happy with how things are going. I did review his white blood cell count which revealed everything to be okay. The one abnormal finding on his CBC was a hemoglobin of 12.7. Subsequently I did also review his sed rate and C-reactive protein. His sed rate measured in at normal level measuring at 26. The C-reactive protein however was elevated today and an  abnormal range indicating inflammation. Still I feel like he is trending in a good direction at this time. He is going to need a refill of the Levaquin he tells me at this point. 07/04/2019 on evaluation today patient actually appears to be doing well in regard to his wound. He has been tolerating the dressing changes without complication. Fortunately there is no signs of active infection which is good news. No fevers, chills, nausea, vomiting, or diarrhea. The good news also is that I did review his labs and though his sed rate was slightly elevated the C-reactive protein was actually down compared to the last evaluation 2 weeks ago this is excellent news I think it is showing signs of improvement. 07/11/2019 on evaluation today patient actually appears to be doing quite well with regard to his wound on my evaluation. This is very slow going but nonetheless he seems to be making progress. Especially in regard to the depth. Nonetheless I believe that we may want to consider going forward with the PriMatrix which was previously approved for him. We will get a have to get a new approval as we will actually be applying this in the new year so we will likely obtain that approval on January 4. In the meantime and for the time being my suggestion is going to be that we go ahead and continue with the wound VAC and we were going at this point with the current wound care measures. 12/28-Patient seen after HBO session today, wound depth with the tunnel measures 3.2 cm, the wound bed appears healthy, continuing with the VAC and HBO 07/25/2019 on evaluation today patient's wound appears to be doing about the  same compared to my last evaluation. Fortunately there is no evidence of significant infection which is good news we will still waiting on the results of his lab work which unfortunately was not done prior to today's visit which is what we were aiming for. Ultimately there is no sign of active infection at  this time which is good news. And he also tells me that the wound VAC has been staying in place and doing fairly well which is also good news. With that being said I am concerned about the fact that the wound has not progressed as much I think we may want to look into getting reapproval for the skin substitute, specifically PriMatrix, that I am hopeful will help to allow this area to granulate in more effectively. I would recommend as well continuing with the wound VAC along with this. 08/01/2019 on evaluation today patient appears to be doing in my opinion slightly better in regard to the wound. He actually does have some tightening of the walls around the edges which is a good thing. Fortunately there is no signs of active infection at this time. No fever chills noted. I do believe that the patient in general seems to be making progress although is very slow. Obviously were trying to get things to speed up somewhat more. No fevers, chills, nausea, vomiting, or diarrhea. 08/08/2019 upon evaluation today patient appears to be doing well with regard to his wound although there is not a significant improvement overall we do have the PriMatrix for availability today to apply. Fortunately there is no signs of active infection and the patient overall seems to be doing well. There is really no need for aggressive sharp debridement at this point either which is also good news. I do believe her get a need to suture and the primary checks in the basin in the PriMatrix in order to keep it in place at the base of the wound underneath the wound VAC. This was discussed with the patient today as well. He is in agreement the plan. He really does not have any filling in the area and so this should not be a complication as far as suturing is concerned. 08/15/2019 on evaluation today patient appears to be doing somewhat better in my opinion in regard to the overall appearance of his wound. He has been tolerating the  dressing changes without complication. Fortunately the skin subappear to still be in place when this was changed on Friday. With that being said it appears that the PriMatrix may have dissolved in the interim between now and then as the sutures are no longer holding anything in place and again I would sutured it into place. Nonetheless I believe that we can go ahead and likely consider going forward that we may need to apply the PriMatrix weekly as opposed every other week as it does seem to have dissolved rather readily and well. Overall the wound bed appears to be doing excellent at this time. 08/22/2019 upon evaluation today the patient does seem to making some progress with regard to his wound. Overall there is some better quality tissue and less of the hyper granular protrusions all of which is good news. With that being said we do have the PriMatrix available for application today and I am in a try to find a better way to suture this in place for him today. 08/29/2019 on evaluation today patient appears to be doing very well in regard to his wound. I do see signs of  new granulation which seems to be somewhat helpful he did have some need for sharp debridement today to clear some of the hyper granulation away but nonetheless I feel like we are headed in a good direction. Fortunately there is no evidence of any systemic infection although we are going to go ahead and reorder the CBC, C-reactive protein, and sed rate today to see where things stand I did just refill his Levaquin as well. 09/05/2019 upon evaluation today patient's wound actually is not appearing to be showing signs of significant improvement compared to what we have seen previous from the standpoint of depth. He overall wound size wise seems to be about the same and again some of the quality of the wound bed is still doing fairly well. I did review his labs and his CBC was normal with no signs of elevation white blood cell count, his  C-reactive protein was normal, and the sed rate was slightly elevated but again I think this may just be due to ulceration really there is nothing significant to indicate severe infection. I think when he likely finishes his current round of oral antibiotics with Levaquin will likely take him off of the medication at that point based on the findings. I do feel like the hyperbarics helped in getting the infection under control as well as improving the overall size of the wound quite significantly. With that being said at this point I really feel like more more at the time frame that he may benefit from a muscle flap in order to try to help this wound heal effectively. I think that this would be the fastest way in order to do this. In the past he has been somewhat resistant to going down that path but nonetheless I feel like he likely should. SHAI, FREUDENTHAL ([id number]) 09/12/2019 on evaluation today patient appears to be doing fairly well in regard to his wound. He is tolerating the wound VAC without complication is still continues to drain significantly. Fortunately there is no signs of active infection at this time which is great news his lab work is also been doing very well. With that being said I do think at this point that the patient is still deriving benefit from the wound VAC especially in regard to controlling moisture which I think it is doing fairly well. He also has some improvement in the overall wound bed and on the medial aspect of the wound there are some hyper granular projections but nothing as severe as what they have been in the past. I am good have to perform some sharp debridement at this location today. 09/19/2019 upon evaluation today patient appears to be doing roughly the same although again the tissue does seem to be showing some signs of improvement which is good news. There is less hyper granular budding. Nonetheless he still is going require some sharp debridement  today. We did get his albumin and prealbumin results back and they are within normal limits in both cases which is excellent news that was the one thing [facility] plastic surgery was waiting on order to get him scheduled. He does seem to be a good candidate physically for the flap surgery if he decides to go that direction. With that being said is still left to be determined once they see the wound what they feel about how best to proceed. Obviously in the end it would be up to Mr. Gormley as to whether he goes forward with the flap surgery or something else  depending on the recommendations. 09/26/2019 upon evaluation today patient appears to be doing about the same in regard to his wound. Fortunately there is no signs of active infection there is also not as much hyper granulation noted today in fact I do not believe there is can be any sharp debridement required at this point. With that being said I do believe he continues to be somewhat stalled at this point with regard to his wound. There is no signs of active infection at this time which is good news. He does have an appointment with [facility] plastic surgery on Friday. 10/03/2019 upon evaluation today patient appears to be doing somewhat poorly especially in his overall demeanor compared to last week's evaluation. He unfortunately did see the physician at [facility] plastic surgery. Unfortunately the plan for the possibility of a flap would be a fairly significant surgery that the patient is really not wanting to take the risk of as if the surgery failed he would end up with a very large open wound at that point. Fortunately there is no signs of active infection at this time. No fevers, chills, nausea, vomiting, or diarrhea. With that being said there was some question that the physician had about why this would not be healing and the possibility that there may be some bone still needs to be removed deeper in the wound. Again the bone scan that we did previous  actually did show evidence of there still being a chronic osteomyelitis back on March 29, 2019. With that being said he did undergo treatment both with hyperbarics as well as oral antibiotics for quite some time since that point. Obviously he may need additional and updated imaging with the surgeon in order to see if there is anything else that can or will need to be done in order to help this area to heal. Obviously I still believe as well that there is something going on here. He is already seeing the surgeon that performed the original surgery after the bone scan but they did not feel that anything needed to be done from a surgical standpoint at that time. 10/10/2019 upon evaluation today patient appears to be really doing about the same with regard to his wound in the gluteal fold region. Unfortunately he is not making a lot of improvement the insurance also contacted home health and apparently the wound VAC is no longer to be covered due to lack of progress. Obviously that is something that we discussed as well as a strong possibility for happening soon and this was even last week that discussed this with him. With that being said again we have tried to manage this wound as best as I could up to this point. Fortunately there is no evidence of active infection at this time. No fevers, chills, nausea, vomiting, or diarrhea. 10/17/2019 upon evaluation today patient has decided to proceed with the flap surgery. He actually seems to be in better spirits today he states he wants to get this healed and after talking to the surgeon last week he feels like that is his best option for doing so. With that being said he also needs to have a referral to infectious disease to be seen for IV antibiotics preoperatively and postoperatively as far as the skin flap is concerned. He wonders if Dr. Steva Ready would be interested or available to do this for him. I explained that I would definitely have to check with  her and I will be happy to do that but I  am not certain What her answer will be in 2 I have a discussion with her. Electronic Signature(s) Signed: 10/17/2019 5:14:51 PM By: Worthy Keeler [name] Entered By: Worthy Keeler on 10/17/2019 17:14:50 DESTEN, DETHLEFSEN ([id number]) -------------------------------------------------------------------------------- Physical Exam Details Patient Name: [name] Date of Service: 10/17/2019 8:00 AM Medical Record Number: [id number] Patient Account Number: [id number] Date of Birth/Sex: [birth year] (68 y.o. M) Treating RN: Army Melia Primary Care Provider: [name] Other Clinician: Referring Provider: [name] Treating Provider/Extender: [name], Dariyah Garduno Weeks in Treatment: 77 Constitutional Well-nourished and well-hydrated in no acute distress. Respiratory normal breathing without difficulty. Psychiatric this patient is able to make decisions and demonstrates good insight into disease process. Alert and Oriented x 3. pleasant and cooperative. Notes Patient's wound bed currently showed signs of really doing about the same in my opinion there is no evidence of improvement unfortunately. He still has some hyper granulation areas. Electronic Signature(s) Signed: 10/17/2019 5:15:12 PM By: Worthy Keeler [name] Entered By: Worthy Keeler on 10/17/2019 17:15:12 KYLIL, CHIARI ([id number]) -------------------------------------------------------------------------------- Physician Orders Details Patient Name: [name] Date of Service: 10/17/2019 8:00 AM Medical Record Number: [id number] Patient Account Number: [id number] Date of Birth/Sex: 01-05-1951 (68 y.o. 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 - in office o Cleanse wound with mild soap and water Anesthetic (add to Medication List) Wound #1 Right Gluteal fold o Topical Lidocaine 4% cream applied to wound bed prior to debridement (In Clinic Only). Skin Barriers/Peri-Wound Care o Skin Prep Primary Wound Dressing Wound #1 Right Gluteal fold o Silver Alginate Secondary Dressing Wound #1 Right Gluteal fold o Tegaderm o XtraSorb Dressing Change Frequency Wound #1 Right Gluteal fold o Change dressing every other day. Follow-up Appointments Wound #1 Right Gluteal fold o Return Appointment in 1 week. Off-Loading Wound #1 Right Gluteal fold o Turn and reposition every 2 hours - no pressure on wounded area Home Health Wound #1 Right Gluteal fold o [place] Visits - [place] Nurse may visit PRN to address patientos wound care needs. o FACE TO FACE ENCOUNTER: MEDICARE and MEDICAID PATIENTS: I certify that this patient is under my care and that I had a face-to- face encounter that meets the physician face-to-face encounter requirements with this patient on this date. The encounter with the patient was in whole or in part for the following MEDICAL CONDITION: (primary reason for [place]) MEDICAL NECESSITY: BOSTIN, VITIELLO ([id number]) I certify, that based on my findings, NURSING services are a medically necessary home health service. HOME BOUND STATUS: I certify that my clinical findings support that this patient is homebound (i.e., Due to illness or injury, pt requires aid of supportive devices such as  crutches, cane, wheelchairs, walkers, the use of special transportation or the assistance of another person to leave their place of residence. There is a normal inability to leave the home and doing so requires considerable and taxing effort. Other absences are for medical reasons / religious services and are infrequent or of short duration  when for other reasons). o If current dressing causes regression in wound condition, may D/C ordered dressing product/s and apply Normal Saline Moist Dressing daily until next [place] / Other MD appointment. [place] of regression in wound condition at [phone]. o Please direct any NON-WOUND related issues/requests for orders to patient's Primary Care Physician Negative Pressure Wound Therapy Wound #1 Right Gluteal fold o Discontinue NPWT. Electronic Signature(s) Signed: 10/17/2019 9:06:41 AM By: Army Melia Signed: 10/20/2019 5:29:46 PM By: Worthy Keeler [name] Entered By: Army Melia on 10/17/2019 08:38:27 MICHIEL, FUGETT ([id number]) -------------------------------------------------------------------------------- Problem List Details Patient Name: [name] Date of Service: 10/17/2019 8:00 AM Medical Record Number: [id number] Patient Account Number: [id number] Date of Birth/Sex: November 06, 1950 (68 y.o. 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 He states that there is no history of diabetes that he is aware of. He does have issues with his prostate and is currently receiving treatment for this by way of oral medication. With that being said I do not have a lot of details in that regard. Nonetheless the patient presents today as a result of having been referred to [place] by another provider initially home health was set to come out and take care of his wound although due to the fact that he apparently drives he's not able to receive home health. His wife is therefore trying to help take care of this wound within although they have been struggling with what exactly to do at this point. She states that she can do some things but she is definitely not a nurse and does have some issues with looking at blood. The good news is the wound does not appear  to be too deep and is fairly superficial at this point. There is no slough noted there is some nonviable skin noted around the surface of the wound and the perimeter at this point. The central portion of the wound appears to be very good with a dermal layer noted this does not appear to be again deep enough to extend it to subcutaneous tissue at this point. Overall the patient for a paraplegic seems to be functioning fairly well he does have  both a spinal cord stimulator as well is the intrathecal pump. In the pump he has Dilaudid and baclofen. 12/07/17 on evaluation today patient presents for follow-up concerning his ongoing lower back thigh ulcer on the right. He states that he did not get the supplies ordered and therefore has not really been able to perform the dressing changes as directed exactly. His wife was able to get some Boarder Foam Dressing's from the drugstore and subsequently has been using hydrogel which did help to a degree in the wound does appear to be able smaller. There is actually more drainage this week noted than previous. 12/21/17 on evaluation today patient appears to be doing rather well in regard to his right gluteal ulcer. He has been tolerating the dressing changes without complication. There does not appear to be any evidence of infection at this point in time. Overall the wound does seem to be making some progress as far as the edges are concerned there's not as much in the way of overlapping of the external wound edges and he has a good epithelium to wound bed border for the most part. This however is not true right at the 12 o'clock location over the span of a little over a centimeters which actually will require debridement today to clean this away and hopefully allow it to continue to heal more appropriately. 12/28/17 on evaluation today patient appears to be doing rather well in regard to his ulcer in the left gluteal region. He's been tolerating the dressing changes  without complication. Apparently he has had some difficulty getting his dressing material. Apparently there's been some confusion with ordering we're gonna check into this. Nonetheless overall he's been showing signs of improvement which is good news. Debridement is not required today. 01/04/18 on evaluation today patient presents for follow-up concerning his right gluteal ulcer. He has been tolerating the dressing changes fairly well. On inspection today it appears he may actually have some maceration them concerned about the fact that he may be developing too much moisture in and around the wound bed which can cause delay in healing. With that being said he unfortunately really has not showed significant signs of improvement since last week's evaluation in fact this may even be just the little bit/slightly larger. Nonetheless he's been having a lot of discomfort I'm not sure this is even related to the wound as he has no pain when I'm to breeding or otherwise cleaning the wound during evaluation today. Nonetheless this is something that we did recommend he talked to his pain specialist concerning. 01/11/18 on evaluation today patient appears to be doing better in regard to his ulceration. He has been tolerating the dressing changes without complication. With that being said overall there's no evidence of infection which is good news. The only thing is he did receive the hatch affair blue classic versus the ready nonetheless I feel like this is perfectly fine and appears to have done well for him over the past week. 01/25/18 on evaluation today patient's wound actually appears to be a little bit larger than during the last evaluation. The good news is the majority of the wound edges actually appear to be fairly firmly attached to the wound bed unfortunately again we're not really making progress in regard to the size. Roughly the wound is about the same size as when I first saw him although again the  wound margin/edges appear to be much better. 02/01/18 on evaluation today patient actually appears to be doing very  well in regard to his wound. Applying the Prisma dry does seem to be better although he does still have issues with slow progression of the wound. There was a slight improvement compared to last week's measurements today. Nonetheless I have been considering other options as far as the possibility of Theraskin or even a snap vac. In general I'm not sure that the Theraskin due to location of the wound would be a very good idea. Nonetheless I do think that a snap vac could be a possibility for the patient and in fact I think this could even be an excellent way to manage the wound possibly seeing some improvement in a very rapid fashion here. Nonetheless this is something that we would need to get approved and I did have a lengthy conversation with the patient about this today. 02/08/18 on evaluation today patient appears to be doing a little better in regard to his ulcer. He has been tolerating the dressing changes without complication. Fortunately despite the fact that the wound is a little bit smaller it's not significantly so unfortunately. We have discussed the possibility of a snap vac we did check with insurance this is actually covered at this point. Fortunately there does not appear to be any sign of infection. Overall I'm TAVARAS, NAHAS. ([id number]) fairly pleased with how things seem to be appearing at this point. 02/15/18 on evaluation today patient appears to be doing rather well in regard to his right gluteal ulcer. Unfortunately the snap vac did not stay in place with his sheer and friction this came loose and did not seem to maintain seal very well. He worked for about two days and it did seem to do very well during that time according to his wife but in general this does not seem to be something that's gonna be beneficial for him long-term. I do believe we need to go back  to standard dressings to see if we can find something that will be of benefit. 03/02/18- He is here in follow up evaluation; there is minimal change in the wound. He will continue with the same treatment plan, would consider changing to iodosrob/iodoflex if ulcer continues to to plateau. He will follow up next week 03/08/18 on evaluation today patient's wound actually appears to be about the same size as when I previously saw him several weeks back. Unfortunately he does have some slightly dark discoloration in the central portion of the wound which has me concerned about pressure injury. I do believe he may be sitting for too long a period of time in fact he tells me that "I probably sit for much too long". He does have some Slough noted on the surface of the wound and again as far as the size of the wound is concerned I'm really not seeing anything that seems to have improved significantly. 03/15/18 on evaluation today patient appears to be doing fairly well in regard to his ulcer. The wound measured pretty much about the same today compared to last week's evaluation when looking at his graph. With that being said the area of bruising/deep tissue injury that was noted last week I do not see at this point. He did get a new cushion fortunately this does seem to be have been of benefit in my pinion. It does appear that he's been off of this more which is good news as well I think that is definitely showing in the overall wound measurements. With that being said I do believe that he  needs to continue to offload I don't think that the fact this is doing better should be or is going to allow him to not have to offload and explain this to him as well. Overall he seems to be in agreement the plan I think he understands. The overall appearance of the wound bed is improved compared to last week I think the Iodoflex has been beneficial in that regard. 03/29/18 on evaluation today patient actually appears to be doing  rather well in regard to his wound from the overall appearance standpoint he does have some granulation although there's some Slough on the surface of the wound noted as well. With that being said he unfortunately has not improved in regard to the overall measurement of the wound in volume or in size. I did have a discussion with him very specifically about offloading today. He actually does work although he mainly is just sitting throughout the day. He tells me he offloads by "lifting himself up for 30 seconds off of his chair occasionally" purchase from [employer] which does seem to have helped. And he has a new cushion that he with that being said he's also able to stand some for a very short period of time but not significant enough I think to provide appropriate offloading. I think the biggest issue at this point with the wound and the fact is not healing as quickly as we would like is due to the fact that he is really not able to appropriately offload while at work. He states the beginning after his injury he actually had a bed at his job that he could lay on in order to offload and that does seem to have been of help back at that time. Nonetheless he had not done this in quite some time unfortunately. I think that could be helpful for him this is something I would like for him to look into. 04/05/18 on evaluation today patient actually presents for follow-up concerning his right gluteal ulcer. Again he really is not significantly improved even compared to last week. He has been tolerating the dressing changes without complication. With that being said fortunately there appears to be no evidence of infection at this time. He has been more proactive in trying to offload. 04/12/18 on evaluation today patient actually appears to be doing a little better in regard to his wound and the right gluteal fold region. He's been tolerating the dressing changes since removing the oasis without  complication. However he was having a lot of burning initially with the oasis in place. He's unsure of exactly why this was given so much discomfort but he assumes that it was the oasis itself causing the problem. Nonetheless this had to be removed after about three days in place although even those three days seem to have made a fairly good improvement in regard to the overall appearance of the wound bed. In fact is the first time that he's made any improvement from the standpoint of measurements in about six weeks. He continues to have no discomfort over the area of the wound itself which leads me to wonder why he was having the burning with the oasis when he does not even feel the actual debridement's themselves. I am somewhat perplexed by this. 04/19/18 on evaluation today patient's wound actually appears to be showing signs of epithelialization around the edge of the wound and in general actually appears to be doing better which is good news. He did have the same burning  after about three days with applying the Endoform last week in the same fashion that I would generally apply a skin substitute. This seems to indicate that it's not the oasis to cause the problem but potentially the moisture buildup that just causes things to burn or there may be some other reaction with the skin prep or Steri-Strips. Nonetheless I'm not sure that is gonna be able to tolerate any skin substitute for a long period of time. The good news is the wound actually appears to be doing better today compared to last week and does seem to finally be making some progress. 04/26/18 on evaluation today patient actually appears to be doing rather well in regard to his ulcer in the right gluteal fold. He has been tolerating the dressing changes without complication which is good news. The Endoform does seem to be helping although he was a little bit more macerated this week. This seems to be an ongoing issue with fluid control at  this point. Nonetheless I think we may be able to add something like Drawtex to help control the drainage. 05/03/18 on evaluation today patient appears to actually be doing better in regard to the overall appearance of his wound. He has been tolerating the dressing changes without complication. Fortunately there appears to be no evidence of infection at this time. I really feel like his wound has shown signs as of today of turning around last week I thought so as well and definitely he could be seen in this week's overall appearance and measurements. In general I'm very pleased with the fact that he finally seems to be making a steady but sure progress. The patient likewise is very pleased. 05/17/18 on evaluation today patient appears to be doing more poorly unfortunately in regard to his ulcer. He has been tolerating the dressing changes without complication. With that being said he tells me that in the past couple of days he and his wife have noticed that we did not seem to be doing quite as well is getting dark near the center. Subsequently upon evaluation today the wound actually does appear to be doing worse compared to previous. He has been tolerating the dressing changes otherwise and he states that he is not been sitting up anymore than he was in the past from what he tells me. Still he has continued to work he states "I'm tired of dealing with this and if I have to just go home and lay in the bed all the time that's what I'll do". Nonetheless I am concerned about the fact that this wound does appear to be deeper than what it was previous. 05/24/18 upon evaluation today patient actually presents after having been in the hospital due to what was presumed to be sepsis secondary to the wound infection. He had an elevated white blood cell count between 14 and 15. With that being said he does seem to be doing somewhat better now. HERSHEY, CURCIO ([id number]) His wound still is giving him some  trouble nonetheless and he is obviously concerned about the fact likely talked about that this does seem to go more deeply than previously noted. I did review his wound culture which showed evidence of Staphylococcus aureus him and group B strep. Nonetheless he is on antibiotics, Levaquin, for this. Subsequently I did review his intake summary from the hospital as well. I also did look at the CT of the lumbar spine with contrast that was performed which showed no bone destruction to suggest  lumbar disguises/osteomyelitis or sacral osteomyelitis. There was no paraspinal abscess. Nonetheless it appears this may have been more of just a soft tissue infection at this point which is good news. He still is nonetheless concerned about the wound which again I think is completely reasonable considering everything he's been through recently. 05/31/18 on evaluation today on evaluation today patient actually appears to be showing signs of his wound be a little bit deeper than what I would like to see. Fortunately he does not show any signs of significant infection although his temperature was 99 today he states he's been checking this at home and has not been elevated. Nonetheless with the undermining that I'm seeing at this point I am becoming more concerned about the wound I do think that offloading is a key factor here that is preventing the speedy recovery at this point. There does not appear to be any evidence of again over infection noted. He's been using Santyl currently. 06/07/18 the patient presents today for follow-up evaluation regarding the left ulcer in the gluteal region. He has been tolerating the Wound VAC fairly well. He is obviously very frustrated with this he states that to mean is really getting in his way. There does not appear to be any evidence of infection at this time he does have a little bit of odor I do not necessarily associate this with infection just something that we sometimes  notice with Wound VAC therapy. With that being said I can definitely catch a tone of discontentment overall in the patient's demeanor today. This when he was previously in the hospital an CT scan was done of the lumbar region which did not reveal any signs of osteomyelitis. With that being said the pelvis in particular was not evaluated distinctly which means he could still have some osteonecrosis I. Nonetheless the Wound VAC was started on Thursday I do want to get this little bit more time before jumping to a CT scan of the pelvis although that is something that I might would recommend if were not see an improvement by that time. 06/14/18 on evaluation today patient actually appears to be doing about the same in regard to his right gluteal ulcer. Again he did have a CT scan of the lumbar spine unfortunately this did not include the pelvis. Nonetheless with the depth of the wound that I'm seeing today even despite the fact that I'm not seeing any evidence of overt cellulitis I believe there's a good chance that we may be dealing with osteomyelitis somewhere in the right Ischial region. No fevers, chills, nausea, or vomiting noted at this time. 06/21/18 on evaluation today patient actually appears to be doing about the same with regard to his wound. The tunnel at 6 o'clock really does not appear to be any deeper although it is a little bit wider. I think at this point you may want to start packing this with white phone. Unfortunately I have not got approval for the CT scan of the pelvis as of yet due to the fact that Medicare apparently has been denied it due to the diagnosis codes not being appropriate according to Medicare for the test requested. With that being said the patient cannot have an MRI and therefore this is the only option that we have as far as testing is concerned. The patient has had infection and was on antibiotics and been added code for cellulitis of the bottom to see if this will  be appropriate for getting the test approved.  Nonetheless I'm concerned about the infection have been spread deeper into the Ischial region. 06/28/18 on evaluation today patient actually appears to be doing rather well all things considered in regard to the right gluteal ulcer. He has been tolerating the dressing changes without complication. With that being said the Wound VAC he states does have to be replaced almost every day or at least reinforced unfortunately. Patient actually has his CT scan later this morning we should have the results by tomorrow. 07/05/18 on evaluation today patient presents for follow-up concerning his right Ischial ulcer. He did see the surgeon Dr. Lysle Pearl last week. They were actually very happy with him and felt like he spent a tremendous amount of time with them as far as discussing his situation was concerned. In the end Dr. Lysle Pearl did contact me as well and determine that he would not recommend any surgical intervention at this point as he felt like it would not be in the patient's best interest based on what he was seeing. He recommended a referral to infectious disease. Subsequently this is something that Dr. Ines Bloomer office is working on setting up for the patient. As far as evaluation today is concerned the patient's wound actually appears to be worse at this point. I am concerned about how things are progressing and specifically about infection. I do not feel like it's the deeper but the area of depth is definitely widening which does have me concerned. No fevers, chills, nausea, or vomiting noted at this time. I think that we do need initiate antibiotic therapy the patient has an allow allergy to amoxicillin/penicillin he states that he gets a rash since childhood. Nonetheless she's never had the issues with Catholics or cephalosporins in general but he is aware of. 07/27/18 on evaluation today patient presents following admission to the hospital on 07/09/18. He was  subsequently discharged on 07/20/18. On 07/15/18 the patient underwent irrigation and debridement was soft tissue biopsy and bone biopsy as well as placement of a Wound VAC in the OR by Dr. Celine Ahr. During the hospital course the patient was placed on a Wound VAC and recommended follow up with surgery in three weeks actually with Dr. Delaine Lame who is infectious disease. The patient was on vancomycin during the hospital course. 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 He also had a bone culture which revealed evidence of methicillin-resistant staph aureus. He is updated CT scan 07/09/18 reveals that he had progression of the which was performed on wound to breakdown down to the trochanter where he actually had irregularities there as well suggestive of osteomyelitis. This was a change just since 9 December when we last performed a CT scan. Obviously this one had gone downhill quite significantly and rapidly. At this point upon evaluation I feel like in general the patient's wound seems to be doing fairly well all things considered upon my evaluation today. Obviously this is larger and deeper than what I previously evaluated but at the same time he seems to be making some progress as far as the appearance of the granulation tissue is concerned. I'm happy in that regard. No fevers, chills, nausea, or vomiting noted at this time. He is on IV vancomycin and Rocephin at the facility. He is currently in [facility]. 08/03/18 upon evaluation today patient's wound appears to be doing better in regard to the overall appearance at this point in time. Fortunately he's been tolerating the Wound VAC without complication and states that the facility  has been taking excellent care of the wound site. Overall I see some Slough noted on the surface which I am going to attempt sharp debridement today of but nonetheless other than this I feel like he's making progress. 08/09/18 on  evaluation today patient's wound appears to be doing much better compared to even last week's evaluation. Do believe that the Wound VAC is been of great benefit for him. He has been tolerating the dressing changes that is the Wound VAC without any complication and he has excellent granulation noted currently. There is no need for sharp debridement at this point. 08/16/18 on evaluation today patient actually appears to be doing very well in regard to the wound in the right gluteal fold region. This is showing signs of progress and again appears to be very healthy which is excellent news. Fortunately there is no sign of active infection by way of odor or drainage at ROHIT, CISNEY. ([id number]) this point. Overall I'm very pleased with how things stand. He seems to be tolerating the Wound VAC without complication. 08/23/18 on evaluation today patient actually appears to be doing better in regard to his wound. He has been tolerating the Wound VAC without complication and in fact it has been collecting a significant amount of drainage which I think is good news especially considering how the wound appears. Fortunately there is no signs of infection at this time definitely nothing appears to be worse which is good news. He has not been started on the Bactrim and Flagyl that was recommended by Dr. Delaine Lame yet. I did actually contact her office this morning in order to check and see were things are that regard their gonna be calling me back. 08/30/18 on evaluation today patient actually appears to show signs of excellent improvement today compared to last evaluation. The undermining is getting much better the wound seems to be feeling quite nicely and I'm very pleased that the granulation in general. With that being said overall I feel like the patient has made excellent progress which is great news. No fevers, chills, nausea, or vomiting noted at this time. 09/06/18 on evaluation today patient actually  appears to be doing rather well in regard to his right gluteal ulcer. This is showing signs of improvement in overall I'm very pleased with how things seem to be progressing. The patient likewise is please. Overall I see no evidence of infection he is about to complete his oral antibiotic regimen which is the end of the antibiotics for him in just about three days. 09/13/18 on evaluation today patient's right Ischial ulcer appears to be showing signs of continued improvement which is excellent news. He's been tolerating the dressing changes without complication. Fortunately there's no signs of infection and the wound that seems to be doing very well. 09/28/18 on evaluation today patient appears to be doing rather well in regard to his right Ischial ulcer. He's been tolerating the Wound VAC without complication he knows there's much less drainage than there used to be this obviously is not a bad thing in my pinion. There's no evidence of infection despite the fact is but nothing about it now for several weeks. 10/04/18 on evaluation today patient appears to be doing better in regard to his right Ischial wound. He has been tolerating the Wound VAC without complication and I do believe that the silver nitrate last week was beneficial for him. Fortunately overall there's no evidence of active infection at this time which is great news. No fevers,  chills, nausea, or vomiting noted at this time. 10/11/18 on evaluation today patient actually appears to be doing rather well in regard to his Ischial ulcer. He's been tolerating the Wound VAC still without complication I feel like this is doing a good job. No fevers, chills, nausea, or vomiting noted at this time. 11/01/18 on evaluation today patient presents after having not been seen in our clinic for several weeks secondary to the fact that he was on evaluation today patient presents after having not been seen in our clinic for several weeks secondary to the fact  that he was in a skilled nursing facility which was on lockdown currently due to the covert 19 national emergency. Subsequently he was discharged from the facility on this past Friday and subsequently made an appointment to come in to see yesterday. Fortunately there's no signs of active infection at this time which is good news and overall he does seem to have made progress since I last saw. Overall I feel like things are progressing quite nicely. The patient is having no pain. 11/08/18 on evaluation today patient appears to be doing okay in regard to his right gluteal ulcer. He has been utilizing a Wound VAC home health this changing this at this point since he's home from the skilled nursing facility. Fortunately there's no signs of obvious active infection at this time. Unfortunately though there's no obvious active infection he is having some maceration and his wife states that when the sheets of the Wound VAC office on Sunday when it broke seal that he ended up having significant issues with some smell as well there concerned about the possibility of infection. Fortunately there's No fevers, chills, nausea, or vomiting noted at this time. 11/15/18 on evaluation today patient actually appears to be doing well in regard to his right gluteal ulcer. He has been tolerating the dressing changes without complication. Specifically the Wound VAC has been utilized up to this point. Fortunately there's no signs of infection and overall I feel like he has made progress even since last week when I last saw him. I'm actually fairly happy with the overall appearance although he does seem to have somewhat of a hyper granular overgrowth in the central portion of the wound which I think may require some sharp debridement to try flatness out possibly utilizing chemical cauterization following. 11/23/18 on evaluation today patient actually appears to be doing very well in regard to his sacral ulcer. He seems to be  showing signs of improvement with good granulation. With that being said he still has the small area of hyper granulation right in the central portion of the wound which I'm gonna likely utilize silver nitrate on today. Subsequently he also keeps having a leak at the 6 o'clock location which is unfortunate we may be able to help out with some suggestions to try to prevent this going forward. Fortunately there's no signs of active infection at this time. 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 He's been tolerating the dressing changes without complication. Fortunately there's no signs of active infection at this time. I've been rather pleased with how things have progressed there still some evidence of pressure getting to the area with some redness right around the immediate wound opening. Nonetheless other than this I'm not seeing any significant complications or issues the wound is somewhat hyper granular. Upon discussing with the patient and his wife  today I'm not sure that the wound is being packed to the base with the foam at this point. And if it's not been packed fully that may be part of the reason why is not seen as much improvement as far as the granulation from the base out. Again we do not want pack too tightly but we need some of the firm to get to the base of the wound. I discussed this with patient and his wife today. 12/06/18 on evaluation today patient appears to be doing well in regard to his right gluteal pressure ulcer. He's been tolerating the dressing changes without complication. Fortunately there's no signs of active infection. He still has some hyper granular tissue and I do think it would be appropriate to continue with the chemical cauterization as of today. 12/16/18 on evaluation today patient actually appears to be doing okay in regard to his right gluteal ulcer. He is been  tolerating the dressing changes without complication including the Wound VAC. Overall I feel like nothing seems to be worsening I do feel like that the hyper granulation buds in the central portion of the wound have improved to some degree with the silver nitrate. We will have to see how things continue to progress. 12/20/18 on evaluation today patient actually appears to be doing much worse in my pinion even compared to last week's evaluation. Unfortunately as opposed to showing any signs of improvement the areas of hyper granular tissue in the central portion of the wound seem to be getting worse. Subsequently the wound bed itself also seems to be getting deeper even compared to last week which is both unusual as well as concerning since JAKIEM, SCOGGINS. ([id number]) prior he had been shown signs of improvement. Nonetheless I think that the issue could be that he's actually having some difficulty in issues with a deeper infection. There's no external signs of infection but nonetheless I am more worried about the internal, osteomyelitis, that could be restarting. He has not been on antibiotics for some time at this point. I think that it may be a good idea to go ahead and started back on an antibiotic therapy while we wait to see what the testing shows. 12/27/18 on evaluation today patient presents for follow-up concerning his left gluteal fold wound. Fortunately he appears to be doing well today. I did review the CT scan which was negative for any signs of osteomyelitis or acute abnormality this is excellent news. Overall I feel like the surface of the wound bed appears to be doing significantly better today compared to previously noted findings. There does not appear any signs of infection nor does he have any pain at this time. 01/03/19 on evaluation today patient actually appears to be doing quite well in regard to his ulcer. Post debridement last week he really did not have too much bleeding  which is good news. Fortunately today this seems to be doing some better but we still has some of the hyper granular tissue noted in the base of the wound which is gonna require sharp debridement today as well. Overall I'm pleased with how things seem to be progressing since we switched away from the Wound VAC I think he is making some progress. 01/10/19 on evaluation today patient appears to be doing better in regard to his right gluteal fold ulcer. He has been tolerating the dressing changes without complication. The debridement to seem to be helping with current away some of the poor hyper granular tissue bugs  throughout the region of his gluteal fold wound. He's been tolerating the dressing changes otherwise without complication which is great news. No fevers, chills, nausea, or vomiting noted at this time. 01/17/19 on evaluation today patient actually appears to be doing excellent in regard to his wound. He's been tolerating the dressing changes without complication. Fortunately there is no signs of active infection at this time which is great news. No fevers, chills, nausea, or vomiting noted at this time. 01/24/19 on evaluation today patient actually appears to be doing quite well with regard to his ulcer. He has been tolerating the dressing changes without complication. Fortunately there's no signs of active infection at this time. Overall been very pleased with the progress that he seems to be making currently. 01/31/19-Patient returns at 1 week with apparent similarity in dimensions to the wound, with no signs of infection, he has been changing dressings twice a day 02/08/19 upon evaluation today patient actually appears to be doing well with regard to his right Ischial ulcer. The wound is not appear to be quite as deep and seems to be making progress which is good news. With that being said I'm still reluctant to go back to the Wound VAC at this point. He's been having to change the dressings  twice a day which is a little bit much in my pinion from the wound care supplies standpoint. I think that possibly attempting to utilize extras orbit may be beneficial this may also help to prevent any additional breakdown secondary to fluid retention in the wound itself. The patient is in agreement with giving this a try. 02/15/19 on evaluation today patient actually appears to be doing decently well with regard to his ulcer in the right to gluteal fold location. He's been tolerating the dressing changes without complication. Fortunately there is no signs of active infection at this time. He is able to keep the current dressing in place more effectively for a day at a time whereas before he was having a changes to to three times a day. The actions or has been helpful in this regard. Fortunately there's no signs of anything getting worse and I do feel like he showing signs of good improvement with regard to the wound bed status. 02/22/2019 patient appears to be doing very well today with regard to his ulcer in the gluteal fold. Fortunately there is no signs of active infection and he has been tolerating the dressing changes without any complication. Overall extremely pleased with how things seem to be progressing. He has much less of the hyper granular projections within the wound these have slowly been debrided away and he seems to be doing well. The wound bed is more uniform. 03/01/19 on evaluation today patient appears to be doing unfortunately about the same in regard to his gluteal ulcer. He's been tolerating the dressing changes without complication. Fortunately there's no signs of active infection at this time. With that being said he continues to develop these hyper granular projections which I'm unsure of exactly what they are and why they are rising. Nonetheless I explained to the patient that I do believe it would be a good idea for [place] to stand a biopsy sample for pathology to see if that can  shed any light on what exactly may be going on here. Fortunately I do not see any obvious signs of infection. With that being said the patient has had a little bit more drainage this week apparently compared to last week. 03/08/2019 on evaluation  today patient actually appears to look somewhat better with regard to the appearance of his wound bed at this time. This is good news. Overall I am very pleased with how things seem to have progressed just in the past week with a switch to the [employer] dressing. I think that has been beneficial for him. With that being said at this time the patient is concerned about his biopsy that I sent off last week unfortunately I do not have that report as of yet. Nonetheless we have called to obtain this and hopefully will hear back from the lab later this morning. 03/15/19 on evaluation today patient's wound actually appears to be doing okay today with regard to the overall appearance of the wound bed. He has been tolerating the dressing changes without complication he still has hyper granular tissue noted but fortunately that seems to be minimal at this point compared to some of what we've seen in the past. Nonetheless I do think that he is still having some issues currently with some of this type of granulation the biopsy and since all showed nothing more than just evidence of granulation tissue. Therefore there really is nothing different to initiate or do at this point. 03/24/19 on evaluation today patient appears to be doing a little better with regard to his ulcer. He's been tolerating the dressing changes without complication. Fortunately there is no signs of active infection at this time. No fevers, chills, nausea, or vomiting noted at this time. I'm overall pleased with how things seem to be progressing. 03/29/2019 on evaluation today patient appears to be doing about the same in regard to his ulcer in the right gluteal fold. Unfortunately he is not  seeming to make a lot of progress and the wound is somewhat stalled. There is no signs of active infection externally but I am concerned about the possibility of infection continuing in the ischial location which previously he did have infection noted. Again is not able to have an MRI so probably our best option for testing for this would be a triple phase bone scan which will detect subtle changes in the bone more so than plain film x-rays. In the past they really have not been beneficial and in fact the CT scans even have been somewhat questionable at times. Nonetheless there is no signs of systemic infection [place], [place] J. ([id number]) which is at least good news but again his wound is not healing at all on the predicted schedule. 04/05/19 on evaluation today patient appears to be doing well all things considering with regard to his wound and the right gluteal fold. He actually has his triple bone scan scheduled for sometime in the next couple of days. With that being said I've also been looking to other possibilities of what could be causing hyper granular tissue were looking into the bone scan again for evaluating for the risk or possibility of infection deeper and I'm also gonna go ahead and see about obtaining a deep tissue culture today to send and see if there's any evidence of infection noted on culture. He's in agreement with that plan. 04/12/2019 on evaluation today patient presents for reevaluation here in our clinic concerning ongoing issues with his right gluteal fold ulcer. 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 It was discussed with him at that point that I think it would be appropriate for him  to proceed with hyperbaric oxygen therapy especially in light of the fact that he is previously been on IV antibiotics at the beginning of the year for close to 3  months followed by several weeks of oral antibiotics that was all prescribed by infectious disease. He had surgical debridement around Christmas December 2019 due to an abscess and osteomyelitis of the ischial bone. Unfortunately this has not really proceeded as well as we would have liked and again we did a CT scan even a couple months ago as he cannot have an MRI secondary to having issues with both a pain pump as well as a spinal cord stimulator which prevent him from going into an MRI machine. With that being said there were chronic changes noted at the ischial tuberosity which had progressed since December 2019 there was no evidence of fluid collection on that initial CT scan. With that being said that was on January 21, 2019. I am not sure that it was a sensitive enough test as compared to an MRI and then subsequently I ordered a bone scan for him which was actually completed on 03/29/2019 and this revealed that he does indeed have positive osteomyelitis involving the right ischial tuberosity. This is adjacent to the ulcer and I think is the reason that his ulcer is not healing. Subsequently I am in a place him back on oral antibiotics today unfortunately his wound culture showed just mixed gram-negative flora with no specific findings of a predominant organism. Nonetheless being with the location I think a good broad-spectrum antibiotic for gram-negative's is a good choice at this point he is previously taken Levaquin without significant issues and I think that is an appropriate antibiotic for him at this time. 04/19/2019 on evaluation today patient actually appears to be doing fairly well with regard to his wound. He has been tolerating the dressing changes without complication. Fortunately there is no signs of active infection and he has been taking the oral antibiotics at this time. Subsequently we did make a referral to infectious disease although Dr. Steva Ready wants the patient to be seen by  general surgery first in order apparently to see if there is anything from a surgical standpoint that should be done prior to initiation of IV antibiotic therapy. Again the patient is okay with actually seeing Dr. Celine Ahr whom he has seen before we will make that referral. 04/26/2019 on evaluation today patient actually appears to be doing well with regard to his wound. He has been tolerating the dressing changes without complication. Fortunately there is no signs of active infection at this time. I do believe that the hyperbaric oxygen therapy along with the antibiotics which I prescribed at this point have been doing well for him. With that being said he has not seen Dr. Celine Ahr the surgeon yet in fact they have not been contacted for scheduling an appointment as of yet. Subsequently the patient is not on antibiotics currently by IV Dr. Steva Ready did want him to see Dr. Celine Ahr first which we are working on trying to get scheduled. We again give the information to the patient today for Dr. Celine Ahr and her number as far as contacting their office to see about getting something scheduled. Again were looking for whether or not they recommend any surgical intervention. 05/03/2019 on evaluation today patient appears to be doing about the same with regard to his wound there may be a little bit of filling in in regard to the base of the wound but still he  has quite a bit of hyper granular tissue that I am seeing today. I believe that he may may need a more aggressive sharp debridement possibly even by the surgeon or under anesthesia in order to clear away some of the hyper granular material in order to help allow for appropriate granulation to fill in. We have made a referral to Dr. Celine Ahr unfortunately it sounds as if they did not receive the referral we contacted them today they should be get in touch with the patient. Depending on how things show from that standpoint the patient may need to see  Dr. Ardyth Gal who is the infectious disease specialist although he really does not want a PICC line again. No fevers, chills, nausea, vomiting, or diarrhea. He is tolerating hyperbaric oxygen therapy very well. 05/12/2019 on evaluation today patient presents for follow-up visit concerning his right gluteal fold ulcer. He did see Dr. Celine Ahr the surgeon who previously evaluated his wound and she actually felt like he was doing quite well with regard to the wound based on what she was seen. He does seem to be responding to some degree with the oral antibiotics along with hyperbaric oxygen therapy at this point she did not see any evidence or need for further surgical intervention at this point. She recommended deferring additional or ongoing antibiotic therapy to Dr. Steva Ready at her discretion. Fortunately there is no signs of active systemic infection. No fevers, chills, nausea, vomiting, or diarrhea. 10/29; patient I am seeing really for the first time today in terms of his wound. 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 He is being treated with hyperbaric oxygen for underlying osteomyelitis most recently documented by a three-phase bone scan. He has been on Levaquin for roughly a month and he is out of these and asked me for a refill. Most recent cultures were negative although I do not think these were bone cultures. He has a very irregular surface to this wound which is almost nodular in texture. It undermines superiorly with the same nodular hyper granulated surface. I see that he was referred to general surgery for an operative debridement although the surgeon did not agree with this I think that would have been the proper course of action in this. He has been using Hydrofera Blue. He is supposed to start a wound VAC tomorrow which is actually a reapplication of wound VAC. I think mostly last time they had trouble keeping the seal in place I hope we have better look at  this this time. 05/24/2019 on evaluation today I did review patient's note from Dr. Dellia Nims where he was seen last week when I was out of the office. Subsequently it does appear that Dr. Dellia Nims was in agreement that the patient really needed debridement to clear away some of the nodular tissue in the base of the wound. The good news is he has the wound VAC initiated and some of this tissue is already starting to breakdown under the treatment of the wound VAC again because it is not really viable. Nonetheless I do believe that we can likely perform some debridement today to clear this away and hopefully the continuation of the wound VAC along with hyperbarics and antibiotic therapy will be beneficial in stopping this from reoccurring. If we get better control in the wound bed in a good spot I think we can definitely use the PriMatrix which we actually did get approval for. 05/31/2019 on evaluation today patient appears to be doing  actually pretty well at this point with regard to his wound. He has been tolerating the dressing changes which is good news. Fortunately there is no signs of infection. He still has some of the hyper granular nodules noted we have not really cleared all these away and I think we can try to do a little bit more that today. I did do quite a bit last week I am hoping to get down to the base of the wound so though actually be able to heal more appropriately. I think the wound VAC is doing a good job right now with controlling the drainage and overall the patient is happy with that. 06/07/2019 upon evaluation today patient appears to be doing quite well compared to last week with regard to the hyper granulation in the base of the [place], Irish J. ([id number]) wound. He has been tolerating the dressing changes without complication. Fortunately there is no signs of active infection at this time. With that being said we have not had any recent measures as far as blood work is  concerned I think we may want to check a few things including a CBC, sed rate, and C-reactive protein. Patient is in agreement with that we can try to get that scheduled for him for this Friday. 06/13/2019 on evaluation today patient actually appears to be doing much better at this point based on what I am seeing with regard to his wound. He has much less in the way of hyper granular tissue which is good news and a lot more healing that is taken place since I last saw him as far as the amount of space within the wound itself. With that being said he is nearing the completion of the initial 40 treatments for hyperbaric oxygen therapy I think this is something I would recommend extending as he does seem to be benefiting at this time. Fortunately there is no evidence of active systemic infection at this point nor even local infection for that matter. 06/20/2019 upon evaluation today patient actually appears to be doing well with regard to his wound. In fact this appears to be doing much better today compared to what it was previous. I been extremely pleased with the progress that he is made. In fact the tunnel is much less deep today than what it was in the past and I am seeing excellent signs of improvement. Overall I am happy with how things are going. I did review his white blood cell count which revealed everything to be okay. The one abnormal finding on his CBC was a hemoglobin of 12.7. Subsequently I did also review his sed rate and C-reactive protein. His sed rate measured in at normal level measuring at 26. The C-reactive protein however was elevated today and an abnormal range indicating inflammation. Still I feel like he is trending in a good direction at this time. He is going to need a refill of the Levaquin he tells me at this point. 07/04/2019 on evaluation today patient actually appears to be doing well in regard to his wound. He has been tolerating the dressing changes  without complication. Fortunately there is no signs of active infection which is good news. No fevers, chills, nausea, vomiting, or diarrhea. The good news also is that I did review his labs and though his sed rate was slightly elevated the C-reactive protein was actually down compared to the last evaluation 2 weeks ago this is excellent news I think it is showing signs of improvement. 07/11/2019  on evaluation today patient actually appears to be doing quite well with regard to his wound on my evaluation. This is very slow going but nonetheless he seems to be making progress. Especially in regard to the depth. Nonetheless I believe that we may want to consider going forward with the PriMatrix which was previously approved for him. We will get a have to get a new approval as we will actually be applying this in the new year so we will likely obtain that approval on January 4. In the meantime and for the time being my suggestion is going to be that we go ahead and continue with the wound VAC and we were going at this point with the current wound care measures. 12/28-Patient seen after HBO session today, wound depth with the tunnel measures 3.2 cm, the wound bed appears healthy, continuing with the VAC and HBO 07/25/2019 on evaluation today patient's wound appears to be doing about the same compared to my last evaluation. Fortunately there is no evidence of significant infection which is good news we will still waiting on the results of his lab work which unfortunately was not done prior to today's visit which is what we were aiming for. Ultimately there is no sign of active infection at this time which is good news. And he also tells me that the wound VAC has been staying in place and doing fairly well which is also good news. With that being said I am concerned about the fact that the wound has not progressed as much I think we may want to look into getting reapproval for the skin substitute,  specifically PriMatrix, that I am hopeful will help to allow this area to granulate in more effectively. I would recommend as well continuing with the wound VAC along with this. 08/01/2019 on evaluation today patient appears to be doing in my opinion slightly better in regard to the wound. He actually does have some tightening of the walls around the edges which is a good thing. Fortunately there is no signs of active infection at this time. No fever chills noted. I do believe that the patient in general seems to be making progress although is very slow. Obviously were trying to get things to speed up somewhat more. No fevers, chills, nausea, vomiting, or diarrhea. 08/08/2019 upon evaluation today patient appears to be doing well with regard to his wound although there is not a significant improvement overall we do have the PriMatrix for availability today to apply. Fortunately there is no signs of active infection and the patient overall seems to be doing well. There is really no need for aggressive sharp debridement at this point either which is also good news. I do believe her get a need to suture and the primary checks in the basin in the PriMatrix in order to keep it in place at the base of the wound underneath the wound VAC. This was discussed with the patient today as well. He is in agreement the plan. He really does not have any filling in the area and so this should not be a complication as far as suturing is concerned. 08/15/2019 on evaluation today patient appears to be doing somewhat better in my opinion in regard to the overall appearance of his wound. He has been tolerating the dressing changes without complication. Fortunately the skin subappear to still be in place when this was changed on Friday. With that being said it appears that the PriMatrix may have dissolved in the interim  between now and then as the sutures are no longer holding anything in place and again I would sutured it into  place. Nonetheless I believe that we can go ahead and likely consider going forward that we may need to apply the PriMatrix weekly as opposed every other week as it does seem to have dissolved rather readily and well. Overall the wound bed appears to be doing excellent at this time. 08/22/2019 upon evaluation today the patient does seem to making some progress with regard to his wound. Overall there is some better quality tissue and less of the hyper granular protrusions all of which is good news. With that being said we do have the PriMatrix available for application today and I am in a try to find a better way to suture this in place for him today. 08/29/2019 on evaluation today patient appears to be doing very well in regard to his wound. I do see signs of new granulation which seems to be somewhat helpful he did have some need for sharp debridement today to clear some of the hyper granulation away but nonetheless I feel like we are headed in a good direction. Fortunately there is no evidence of any systemic infection although we are going to go ahead and reorder the CBC, C-reactive protein, and sed rate today to see where things stand I did just refill his Levaquin as well. 09/05/2019 upon evaluation today patient's wound actually is not appearing to be showing signs of significant improvement compared to what we have seen previous from the standpoint of depth. He overall wound size wise seems to be about the same and again some of the quality of the wound bed is still doing fairly well. I did review his labs and his CBC was normal with no signs of elevation white blood cell count, his C-reactive protein was normal, and the sed rate was slightly elevated but again I think this may just be due to ulceration really there is nothing significant to indicate severe infection. I think when he likely finishes his current round of oral antibiotics with Levaquin will likely take him off of the medication at that  point based on the [place], KARL BUHRMAN. ([id number]) findings. I do feel like the hyperbarics helped in getting the infection under control as well as improving the overall size of the wound quite significantly. With that being said at this point I really feel like more more at the time frame that he may benefit from a muscle flap in order to try to help this wound heal effectively. I think that this would be the fastest way in order to do this. In the past he has been somewhat resistant to going down that path but nonetheless I feel like he likely should. 09/12/2019 on evaluation today patient appears to be doing fairly well in regard to his wound. He is tolerating the wound VAC without complication is still continues to drain significantly. Fortunately there is no signs of active infection at this time which is great news his lab work is also been doing very well. With that being said I do think at this point that the patient is still deriving benefit from the wound VAC especially in regard to controlling moisture which I think it is doing fairly well. He also has some improvement in the overall wound bed and on the medial aspect of the wound there are some hyper granular projections but nothing as severe as what they have been in the past. I  am good have to perform some sharp debridement at this location today. 09/19/2019 upon evaluation today patient appears to be doing roughly the same although again the tissue does seem to be showing some signs of improvement which is good news. There is less hyper granular budding. Nonetheless he still is going require some sharp debridement today. We did get his albumin and prealbumin results back and they are within normal limits in both cases which is excellent news that was the one thing [facility] plastic surgery was waiting on order to get him scheduled. He does seem to be a good candidate physically for the flap surgery if he decides to go that direction. With  that being said is still left to be determined once they see the wound what they feel about how best to proceed. Obviously in the end it would be up to Mr. Luney as to whether he goes forward with the flap surgery or something else depending on the recommendations. 09/26/2019 upon evaluation today patient appears to be doing about the same in regard to his wound. Fortunately there is no signs of active infection there is also not as much hyper granulation noted today in fact I do not believe there is can be any sharp debridement required at this point. With that being said I do believe he continues to be somewhat stalled at this point with regard to his wound. There is no signs of active infection at this time which is good news. He does have an appointment with [facility] plastic surgery on Friday. 10/03/2019 upon evaluation today patient appears to be doing somewhat poorly especially in his overall demeanor compared to last week's evaluation. He unfortunately did see the physician at [facility] plastic surgery. Unfortunately the plan for the possibility of a flap would be a fairly significant surgery that the patient is really not wanting to take the risk of as if the surgery failed he would end up with a very large open wound at that point. Fortunately there is no signs of active infection at this time. No fevers, chills, nausea, vomiting, or diarrhea. With that being said there was some question that the physician had about why this would not be healing and the possibility that there may be some bone still needs to be removed deeper in the wound. Again the bone scan that we did previous actually did show evidence of there still being a chronic osteomyelitis back on March 29, 2019. With that being said he did undergo treatment both with hyperbarics as well as oral antibiotics for quite some time since that point. Obviously he may need additional and updated imaging with the surgeon in order to see if there is  anything else that can or will need to be done in order to help this area to heal. Obviously I still believe as well that there is something going on here. He is already seeing the surgeon that performed the original surgery after the bone scan but they did not feel that anything needed to be done from a surgical standpoint at that time. 10/10/2019 upon evaluation today patient appears to be really doing about the same with regard to his wound in the gluteal fold region. Unfortunately he is not making a lot of improvement the insurance also contacted home health and apparently the wound VAC is no longer to be covered due to lack of progress. Obviously that is something that we discussed as well as a strong possibility for happening soon and this was even  last week that discussed this with him. With that being said again we have tried to manage this wound as best as I could up to this point. Fortunately there is no evidence of active infection at this time. No fevers, chills, nausea, vomiting, or diarrhea. 10/17/2019 upon evaluation today patient has decided to proceed with the flap surgery. He actually seems to be in better spirits today he states he wants to get this healed and after talking to the surgeon last week he feels like that is his best option for doing so. With that being said he also needs to have a referral to infectious disease to be seen for IV antibiotics preoperatively and postoperatively as far as the skin flap is concerned. He wonders if Dr. Steva Ready would be interested or available to do this for him. I explained that I would definitely have to check with her and I will be happy to do that but I am not certain What her answer will be in 2 I have a discussion with her. Objective Constitutional Well-nourished and well-hydrated in no acute distress. Vitals Time Taken: 8:10 AM, Height: 73 in, Weight: 210 lbs, BMI: 27.7, Temperature: 98.2 F, Pulse: 74 bpm, Respiratory Rate: 16  breaths/min, Blood Pressure: 131/74 mmHg. Respiratory normal breathing without difficulty. Psychiatric this patient is able to make decisions and demonstrates good insight into disease process. Alert and Oriented x 3. pleasant and cooperative. HOLBERT, BENTLEY ([id number]) General Notes: Patient's wound bed currently showed signs of really doing about the same in my opinion there is no evidence of improvement unfortunately. He still has some hyper granulation areas. Integumentary (Hair, Skin) Wound #1 status is Open. Original cause of wound was Pressure Injury. The wound is located on the Right Gluteal fold. The wound measures 3.7cm length x 1.9cm width x 2.8cm depth; 5.521cm^2 area and 15.46cm^3 volume. There is Fat Layer (Subcutaneous Tissue) Exposed exposed. There is no undermining noted, however, there is tunneling at 12:00 with a maximum distance of 4cm. There is a large amount of serous drainage noted. The wound margin is epibole. There is large (67-100%) pink, hyper - granulation within the wound bed. There is a small (1-33%) amount of necrotic tissue within the wound bed including Adherent Slough. 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 - in office Cleanse wound with mild soap and water Anesthetic (add to Medication List): Wound #1 Right Gluteal fold: Topical Lidocaine 4% cream applied to wound bed prior to debridement (In Clinic Only). Skin Barriers/Peri-Wound Care: Skin Prep Primary Wound Dressing: Wound #1 Right Gluteal fold: Silver Alginate Secondary Dressing: Wound #1 Right Gluteal fold: Tegaderm XtraSorb Dressing Change Frequency: Wound #1 Right Gluteal  fold: Change dressing every other day. Follow-up Appointments: Wound #1 Right Gluteal fold: Return Appointment in 1 week. Off-Loading: Wound #1 Right Gluteal fold: Turn and reposition every 2 hours - no pressure on wounded area Home Health: Wound #1 Right Gluteal fold: Continue Home Health Visits - [place] Nurse may visit PRN to address patient s wound care needs. FACE TO FACE ENCOUNTER: MEDICARE and MEDICAID PATIENTS: I certify that this patient is under my care and that I had a face-to-face encounter that meets the physician face-to-face encounter requirements with this patient on this date. The encounter with  the patient was in whole or in part for the following MEDICAL CONDITION: (primary reason for [place]) MEDICAL NECESSITY: I certify, that based on my findings, NURSING services are a medically necessary home health service. HOME BOUND STATUS: I certify that my clinical findings support that this patient is homebound (i.e., Due to illness or injury, pt requires aid of supportive devices such as crutches, cane, wheelchairs, walkers, the use of special transportation or the assistance of another person to leave their place of residence. There is a normal inability to leave the home and doing so requires considerable and taxing effort. Other absences are for medical reasons / religious services and are infrequent or of short duration when for other reasons). If current dressing causes regression in wound condition, may D/C ordered dressing product/s and apply Normal Saline Moist Dressing daily until next JALONI, KETTINGER ([id number]) [place] / Other MD appointment. Frederica of regression in wound condition at [phone]. Please direct any NON-WOUND related issues/requests for orders to patient's Primary Care Physician Negative Pressure Wound Therapy: Wound #1 Right Gluteal fold: Discontinue NPWT. 1. My suggestion at this point is  good to be that we go ahead and continue with the alginate dressing for the time being. I am also going to suggest that the patient continue to follow-up with plastic surgery at [facility]. Obviously I think the flap surgery may be his best bet trying to get this area healed and completely closed as soon as possible. 2. I am also going to suggest at this time that we go ahead and have the patient continue with appropriate wound care measures utilizing the silver alginate dressing in the meantime. 3. I am getting in touch with Dr. Raelene Bott office and I did in fact have my nurse call them today we had to leave a message but I do want her discussed with her the possibility of doing the IV antibiotics for the patient so he does not have to drive to [facility] for this as that is obviously a long drive for him to sit on the area plus he then has a longer wait get into the clinic there as well. He does reside here in [place]. We will see patient back for reevaluation in 1 week here in the clinic. If anything worsens or changes patient will contact our office for additional recommendations. Electronic Signature(s) Signed: 10/17/2019 5:16:22 PM By: Worthy Keeler [name] Entered By: Worthy Keeler on 10/17/2019 17:16:21 ABDUALLAH, DOOLITTLE ([id number]) -------------------------------------------------------------------------------- SuperBill Details Patient Name: [name] Date of Service: 10/17/2019 Medical Record Number: [id number] Patient Account Number: [id number] Date of Birth/Sex: 11/10/1950 (68 y.o. 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, sequela I10 Essential (primary) hypertension Facility Procedures CPT4 Code: [id number] Description: 99213 - WOUND CARE VISIT-LEV 3 EST PT Modifier: Quantity: 1 Physician Procedures CPT4 [id number] Description: 99214 - WC PHYS LEVEL 4 - EST PT Modifier: Quantity: 1 CPT4 Code: Description: ICD-10 Diagnosis Description L89.314 Pressure ulcer of right buttock, stage 4 M86.68 Other chronic osteomyelitis, other site L03.317 Cellulitis of buttock G82.20 Paraplegia, unspecified Modifier: Quantity: Electronic Signature(s) Signed: 10/17/2019 5:16:38 PM By: Worthy Keeler [name] Entered By:  Worthy Keeler on 10/17/2019 17:16:37

## 2019-10-24 ENCOUNTER — Encounter: Payer: Medicare Other | Attending: Physician Assistant | Admitting: Physician Assistant

## 2019-10-24 ENCOUNTER — Other Ambulatory Visit: Payer: Self-pay

## 2019-10-24 DIAGNOSIS — L89314 Pressure ulcer of right buttock, stage 4: Secondary | ICD-10-CM | POA: Insufficient documentation

## 2019-10-24 DIAGNOSIS — I10 Essential (primary) hypertension: Secondary | ICD-10-CM | POA: Insufficient documentation

## 2019-10-24 DIAGNOSIS — Z8249 Family history of ischemic heart disease and other diseases of the circulatory system: Secondary | ICD-10-CM | POA: Diagnosis not present

## 2019-10-24 DIAGNOSIS — Z823 Family history of stroke: Secondary | ICD-10-CM | POA: Insufficient documentation

## 2019-10-24 DIAGNOSIS — L03317 Cellulitis of buttock: Secondary | ICD-10-CM | POA: Insufficient documentation

## 2019-10-24 DIAGNOSIS — I252 Old myocardial infarction: Secondary | ICD-10-CM | POA: Diagnosis not present

## 2019-10-24 DIAGNOSIS — M8668 Other chronic osteomyelitis, other site: Secondary | ICD-10-CM | POA: Insufficient documentation

## 2019-10-24 DIAGNOSIS — Z880 Allergy status to penicillin: Secondary | ICD-10-CM | POA: Diagnosis not present

## 2019-10-24 DIAGNOSIS — G8220 Paraplegia, unspecified: Secondary | ICD-10-CM | POA: Diagnosis not present

## 2019-10-24 DIAGNOSIS — Z881 Allergy status to other antibiotic agents status: Secondary | ICD-10-CM | POA: Diagnosis not present

## 2019-10-24 DIAGNOSIS — Z8546 Personal history of malignant neoplasm of prostate: Secondary | ICD-10-CM | POA: Insufficient documentation

## 2019-10-24 NOTE — Progress Notes (Signed)
DOLORES, BALDERA ([id number]) Visit Report for 10/24/2019 Arrival Information Details Patient Name: [name], [name] Date of Service: 10/24/2019 8:00 AM Medical Record Number: [id number] Patient Account Number: [id number] Date of Birth/Sex: 1950-11-29 (68 y.o. M) Treating RN: [name] Primary Care Nazia Rhines: [name] Other Clinician: Referring Derril Franek: [name] Treating Lalaine Overstreet/Extender: [name], HOYT Weeks in Treatment: 99 Visit Information History Since Last Visit Added or deleted any medications: No Patient Arrived: Ambulatory Has Dressing in Place as Prescribed: Yes Arrival Time: 08:12 Pain Present Now: Yes Accompanied By: wife Transfer Assistance: None Patient Identification Verified: Yes Secondary Verification Process Completed: Yes Patient Requires Transmission-Based Precautions: No Patient Has Alerts: Yes Patient Alerts: NOT Diabetic Electronic Signature(s) Signed: 10/24/2019 11:52:59 AM By: Gretta Cool, BSN, RN, CWS, Kim RN, BSN Entered By: Gretta Cool, BSN, RN, CWS, Kim on 10/24/2019 08:12:32 [name] ([id number]) -------------------------------------------------------------------------------- Clinic Level of Care Assessment Details Patient Name: [name] Date of Service: 10/24/2019 8:00 AM Medical Record Number: [id number] Patient Account Number: [id number] Date of Birth/Sex: Mar 15, 1951 (68 y.o. M) Treating RN: Army Melia Primary Care Sharnice Bosler: [name] Other Clinician: Referring Lewellyn Fultz: [name] Treating Hagop Mccollam/Extender: [name], HOYT Weeks in Treatment: 99 Clinic Level of Care Assessment Items TOOL 4 Quantity Score []  - Use when only an EandM is performed on FOLLOW-UP visit 0 ASSESSMENTS - Nursing Assessment / Reassessment X - Reassessment of Co-morbidities (includes updates in patient status) 1 10 X- 1 5 Reassessment of Adherence to Treatment Plan ASSESSMENTS - Wound and Skin Assessment / Reassessment X  - Simple Wound Assessment / Reassessment - one wound 1 5 []  - 0 Complex Wound Assessment / Reassessment - multiple wounds []  - 0 Dermatologic / Skin Assessment (not related to wound area) ASSESSMENTS - Focused Assessment []  - Circumferential Edema Measurements - multi extremities 0 []  - 0 Nutritional Assessment / Counseling / Intervention []  - 0 Lower Extremity Assessment (monofilament, tuning fork, pulses) []  - 0 Peripheral Arterial Disease Assessment (using hand held doppler) ASSESSMENTS - Ostomy and/or Continence Assessment and Care []  - Incontinence Assessment and Management 0 []  - 0 Ostomy Care Assessment and Management (repouching, etc.) PROCESS - Coordination of Care X - Simple Patient / Family Education for ongoing care 1 15 []  - 0 Complex (extensive) Patient / Family Education for ongoing care []  - 0 Staff obtains Programmer, systems, Records, Test Results / Process Orders []  - 0 Staff telephones HHA, Nursing Homes / Clarify orders / etc []  - 0 Routine Transfer to another Facility (non-emergent condition) []  - 0 Routine Hospital Admission (non-emergent condition) []  - 0 New Admissions / Biomedical engineer / Ordering NPWT, Apligraf, etc. []  - 0 Emergency Hospital Admission (emergent condition) X- 1 10 Simple Discharge Coordination []  - 0 Complex (extensive) Discharge Coordination PROCESS - Special Needs []  - Pediatric / Minor Patient Management 0 []  - 0 Isolation Patient Management []  - 0 Hearing / Language / Visual special needs []  - 0 Assessment of Community assistance (transportation, D/C planning, etc.) BETTIE, QUEBODEAUX. ([id number]) []  - 0 Additional assistance / Altered mentation []  - 0 Support Surface(s) Assessment (bed, cushion, seat, etc.) INTERVENTIONS - Wound Cleansing / Measurement X - Simple Wound Cleansing - one wound 1 5 []  - 0 Complex Wound Cleansing - multiple wounds X- 1 5 Wound Imaging (photographs - any number of wounds) []  - 0 Wound  Tracing (instead of photographs) X- 1 5 Simple Wound Measurement - one wound []  - 0 Complex Wound Measurement - multiple wounds INTERVENTIONS - Wound Dressings []  -  Small Wound Dressing one or multiple wounds 0 X- 1 15 Medium Wound Dressing one or multiple wounds []  - 0 Large Wound Dressing one or multiple wounds []  - 0 Application of Medications - topical []  - 0 Application of Medications - injection INTERVENTIONS - Miscellaneous []  - External ear exam 0 []  - 0 Specimen Collection (cultures, biopsies, blood, body fluids, etc.) []  - 0 Specimen(s) / Culture(s) sent or taken to Lab for analysis []  - 0 Patient Transfer (multiple staff / Harrel Lemon Lift / Similar devices) []  - 0 Simple Staple / Suture removal (25 or less) []  - 0 Complex Staple / Suture removal (26 or more) []  - 0 Hypo / Hyperglycemic Management (close monitor of Blood Glucose) []  - 0 Ankle / Brachial Index (ABI) - do not check if billed separately X- 1 5 Vital Signs Has the patient been seen at the hospital within the last three years: Yes Total Score: 80 Level Of Care: New/Established - Level 3 Electronic Signature(s) Signed: 10/24/2019 3:08:20 PM By: Army Melia Entered By: Army Melia on 10/24/2019 08:36:32 [name] ([id number]) -------------------------------------------------------------------------------- Encounter Discharge Information Details Patient Name: [name] Date of Service: 10/24/2019 8:00 AM Medical Record Number: [id number] Patient Account Number: [id number] Date of Birth/Sex: Sep 25, 1950 (68 y.o. M) Treating RN: Army Melia Primary Care Reis Goga: [name] Other Clinician: Referring Krista Godsil: [name] Treating Mercer Peifer/Extender: [name], HOYT Weeks in Treatment: 82 Encounter Discharge Information Items Discharge Condition: Stable Ambulatory Status: Wheelchair Discharge Destination: Home Transportation: Private Auto Accompanied By: wife Schedule  Follow-up Appointment: Yes Clinical Summary of Care: Electronic Signature(s) Signed: 10/24/2019 3:08:20 PM By: Army Melia Entered By: Army Melia on 10/24/2019 08:38:47 [name] ([id number]) -------------------------------------------------------------------------------- Lower Extremity Assessment Details Patient Name: [name] Date of Service: 10/24/2019 8:00 AM Medical Record Number: [id number] Patient Account Number: [id number] Date of Birth/Sex: [birth year] (68 y.o. M) Treating RN: [name] Primary Care Hammond Obeirne: [name] Other Clinician: Referring Jaymarie Yeakel: [name] Treating Shilpa Bushee/Extender: Worthy Keeler Weeks in Treatment: 45 Electronic Signature(s) Signed: 10/24/2019 11:52:59 AM By: Gretta Cool, BSN, RN, CWS, Kim RN, BSN Entered By: Gretta Cool, BSN, RN, CWS, Kim on 10/24/2019 08:18:41 RUMI, FLEXER ([id number]) -------------------------------------------------------------------------------- Multi Wound Chart Details Patient Name: [name] Date of Service: 10/24/2019 8:00 AM Medical Record Number: [id number] Patient Account Number: [id number] Date of Birth/Sex: [birth year] (68 y.o. M) Treating RN: Army Melia Primary Care Amarea Macdowell: [name] Other Clinician: Referring Elfego Giammarino: [name] Treating Lakeita Panther/Extender: [name], HOYT Weeks in Treatment: 99 Vital Signs Height(in): 73 Pulse(bpm): 70 Weight(lbs): 210 Blood Pressure(mmHg): 112/60 Body Mass Index(BMI): 28 Temperature(F): 98.7 Respiratory Rate(breaths/min): 16 Photos: [N/A:N/A] Wound Location: Right Gluteal fold N/A N/A Wounding Event: Pressure Injury N/A N/A Primary Etiology: Pressure Ulcer N/A N/A Comorbid History: Cataracts, Hypertension, Paraplegia N/A N/A Date Acquired: 11/02/2017 N/A N/A Weeks of Treatment: 99 N/A N/A Wound Status: Open N/A N/A Measurements L x W x D (cm) 3.1x1.1x2 N/A N/A Area (cm) : 2.678 N/A N/A Volume (cm) : 5.356  N/A N/A % Reduction in Area: 73.40% N/A N/A % Reduction in Volume: -432.90% N/A N/A Position 1 (o'clock): 12 Maximum Distance 1 (cm): 4.5 Tunneling: Yes N/A N/A Classification: Category/Stage IV N/A N/A Exudate Amount: Large N/A N/A Exudate Type: Serous N/A N/A Exudate Color: amber N/A N/A Wound Margin: Epibole N/A N/A Granulation Amount: Large (67-100%) N/A N/A Granulation Quality: Pink, Hyper-granulation N/A N/A Necrotic Amount: Small (1-33%) N/A N/A Exposed Structures: Fat Layer (Subcutaneous Tissue) N/A N/A Exposed: Yes Fascia: No Tendon: No  Muscle: No Joint: No Bone: No Epithelialization: Small (1-33%) N/A N/A Treatment Notes Electronic Signature(s) Signed: 10/24/2019 3:08:20 PM By: Thornell Sartorius, [name] Entered By: Army Melia on 10/24/2019 08:31:38 OBERT, TEGGE ([id number]) -------------------------------------------------------------------------------- [place] Details Patient Name: [name] Date of Service: 10/24/2019 8:00 AM Medical Record Number: [id number] Patient Account Number: [id number] Date of Birth/Sex: [birth year] (68 y.o. M) Treating RN: Army Melia Primary Care Dontel Harshberger: [name] Other Clinician: Referring Eulogia Dismore: [name] Treating Augie Vane/Extender: [name], HOYT Weeks in Treatment: 99 Active Inactive Osteomyelitis Nursing Diagnoses: Infection: osteomyelitis Goals: Patient's osteomyelitis will resolve Date Initiated: 08/29/2019 Target Resolution Date: 09/28/2019 Goal Status: Active Signs and symptoms for osteomyelitis will be recognized and promptly addressed Date Initiated: 08/29/2019 Target Resolution Date: 09/28/2019 Goal Status: Active Interventions: Assess for signs and symptoms of osteomyelitis resolution every visit Treatment Activities: Systemic antibiotics : 08/29/2019 Notes: Pressure Nursing Diagnoses: Knowledge deficit related to management of pressures  ulcers Goals: Patient/caregiver will verbalize understanding of pressure ulcer management Date Initiated: 05/17/2018 Target Resolution Date: 05/28/2018 Goal Status: Active Interventions: Assess: immobility, friction, shearing, incontinence upon admission and as needed Notes: Wound/Skin Impairment Nursing Diagnoses: Impaired tissue integrity Goals: Patient/caregiver will verbalize understanding of skin care regimen Date Initiated: 11/30/2017 Target Resolution Date: 12/21/2017 Goal Status: Active Ulcer/skin breakdown will have a volume reduction of 30% by week 4 Date Initiated: 11/30/2017 Target Resolution Date: 12/21/2017 Goal Status: Active Interventions: TIREE, KRUMWIEDE ([id number]) Assess patient/caregiver ability to obtain necessary supplies Assess patient/caregiver ability to perform ulcer/skin care regimen upon admission and as needed Assess ulceration(s) every visit Treatment Activities: Skin care regimen initiated : 11/30/2017 Notes: Electronic Signature(s) Signed: 10/24/2019 3:08:20 PM By: Army Melia Entered By: Army Melia on 10/24/2019 08:31:17 BLAYNE, DUBA ([id number]) -------------------------------------------------------------------------------- Pain Assessment Details Patient Name: [name] Date of Service: 10/24/2019 8:00 AM Medical Record Number: [id number] Patient Account Number: [id number] Date of Birth/Sex: Nov 04, 1950 (68 y.o. M) Treating RN: [name] Primary Care Melaysia Streed: [name] Other Clinician: Referring Mayjor Ager: [name] Treating Serjio Deupree/Extender: [name], HOYT Weeks in Treatment: 29 Active Problems Location of Pain Severity and Description of Pain Patient Has Paino Yes Site Locations Character of Pain Describe the Pain: Exhausting, Sharp Pain Management and Medication Current Pain Management: Electronic Signature(s) Signed: 10/24/2019 11:52:59 AM By: Gretta Cool, BSN, RN, CWS, Kim RN, BSN Entered By: Gretta Cool,  BSN, RN, CWS, Kim on 10/24/2019 08:13:46 YSRAEL, BOGHOSIAN ([id number]) -------------------------------------------------------------------------------- Patient/Caregiver Education Details Patient Name: [name] Date of Service: 10/24/2019 8:00 AM Medical Record Number: [id number] Patient Account Number: [id number] Date of Birth/Gender: 18-May-1951 (68 y.o. M) Treating RN: Army Melia Primary Care Physician: [name] Other Clinician: Referring Physician: [name] Treating Physician/Extender: [name], HOYT Weeks in Treatment: 91 Education Assessment Education Provided To: Patient Education Topics Provided Wound/Skin Impairment: Handouts: Caring for Your Ulcer Methods: Demonstration, Explain/Verbal Responses: State content correctly Electronic Signature(s) Signed: 10/24/2019 3:08:20 PM By: Army Melia Entered By: Army Melia on 10/24/2019 08:38:21 MALIQUE, DEBELLIS ([id number]) -------------------------------------------------------------------------------- Wound Assessment Details Patient Name: [name] Date of Service: 10/24/2019 8:00 AM Medical Record Number: [id number] Patient Account Number: [id number] Date of Birth/Sex: 1950/10/29 (68 y.o. M) Treating RN: [name] Primary Care Kaisha Wachob: [name] Other Clinician: Referring Chequita Mofield: [name] Treating Cheryn Lundquist/Extender: [name], HOYT Weeks in Treatment: 99 Wound Status Wound Number: 1 Primary Etiology: Pressure Ulcer Wound Location: Right Gluteal fold Wound Status: Open Wounding Event: Pressure Injury Comorbid History: Cataracts, Hypertension, Paraplegia Date Acquired: 11/02/2017 Weeks Of Treatment: 99  Clustered Wound: No Photos Wound Measurements Length: (cm) 3.1 % R Width: (cm) 1.1 % R Depth: (cm) 2 Epi Area: (cm) 2.678 Tu Volume: (cm) 5.356 eduction in Area: 73.4% eduction in Volume: -432.9% thelialization: Small (1-33%) nneling: Yes Position  (o'clock): 12 Maximum Distance: (cm) 4.5 Wound Description Classification: Category/Stage IV Fou Wound Margin: Epibole Slo Exudate Amount: Large Exudate Type: Serous Exudate Color: amber l Odor After Cleansing: No ugh/Fibrino Yes Wound Bed Granulation Amount: Large (67-100%) Exposed Structure Granulation Quality: Pink, Hyper-granulation Fascia Exposed: No Necrotic Amount: Small (1-33%) Fat Layer (Subcutaneous Tissue) Exposed: Yes Necrotic Quality: Adherent Slough Tendon Exposed: No Muscle Exposed: No Joint Exposed: No Bone Exposed: No Treatment Notes Wound #1 (Right Gluteal fold) Notes S.cell, xsorb, tegaderm RODMAN, GRONLUND ([id number]) Electronic Signature(s) Signed: 10/24/2019 11:52:59 AM By: Gretta Cool, BSN, RN, CWS, Kim RN, BSN Entered By: Gretta Cool, BSN, RN, CWS, Kim on 10/24/2019 08:18:31 HIROYUKI, DINUZZO ([id number]) -------------------------------------------------------------------------------- Vitals Details Patient Name: [name] Date of Service: 10/24/2019 8:00 AM Medical Record Number: [id number] Patient Account Number: [id number] Date of Birth/Sex: Oct 11, 1950 (68 y.o. M) Treating RN: [name] Primary Care Virdie Penning: [name] Other Clinician: Referring Zacory Fiola: [name] Treating Connell Bognar/Extender: [name], HOYT Weeks in Treatment: 99 Vital Signs Time Taken: 08:12 Temperature (F): 98.7 Height (in): 73 Pulse (bpm): 70 Weight (lbs): 210 Respiratory Rate (breaths/min): 16 Body Mass Index (BMI): 27.7 Blood Pressure (mmHg): 112/60 Reference Range: 80 - 120 mg / dl Electronic Signature(s) Signed: 10/24/2019 11:52:59 AM By: Gretta Cool, BSN, RN, CWS, Kim RN, BSN Entered By: Gretta Cool, BSN, RN, CWS, Kim on 10/24/2019 08:13:31

## 2019-10-24 NOTE — Progress Notes (Addendum)
DSHAUN, DEETS ([id number]) Visit Report for 10/24/2019 Chief Complaint Document Details Patient Name: [name], Brett Date of Service: 10/24/2019 8:00 AM Medical Record Number: [id number] Patient Account Number: [id number] Date of Birth/Sex: 05/17/1951 (69 y.o. M) Treating RN: Army Melia Primary Care Provider: [name] Other Clinician: Referring Provider: [name] Treating Provider/Extender: [name], Kieli Golladay Weeks in Treatment: 50 Information Obtained from: Patient Chief Complaint Right gluteal fold ulcer Electronic Signature(s) Signed: 10/24/2019 8:24:24 AM By: Worthy Keeler [name] Entered By: Worthy Keeler on 10/24/2019 08:24:24 Brett, [name] ([id number]) -------------------------------------------------------------------------------- HPI Details Patient Name: [name] Date of Service: 10/24/2019 8:00 AM Medical Record Number: [id number] Patient Account Number: [id number] Date of Birth/Sex: May 31, 1951 (69 y.o. 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 He states that there is no history of diabetes that he is aware of. He does have issues with his prostate and is currently receiving treatment for this by way of oral medication. With that being said I do not have a lot of details in that regard. Nonetheless the patient presents today as a result of having been referred to [place] by another provider initially home health was set to come out and take care of his wound although due to the fact that he  apparently drives he's not able to receive home health. His wife is therefore trying to help take care of this wound within although they have been struggling with what exactly to do at this point. She states that she can do some things but she is definitely not a nurse and does have some issues with looking at blood. The good news is the wound does not appear to be too deep and is fairly superficial at this point. There is no slough noted there is some nonviable skin noted around the surface of the wound and the perimeter at this point. The central portion of the wound appears to be very good with a dermal layer noted this does not appear to be again deep enough to extend it to subcutaneous tissue at this point. 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 In the pump he has Dilaudid and baclofen. 12/07/17 on evaluation today patient presents for follow-up concerning his ongoing lower back thigh ulcer on the right. He states that he did not get the supplies ordered and therefore has not really been able to perform the dressing changes as directed exactly. His wife was able to get some Boarder Foam Dressing's from the drugstore and subsequently has been using hydrogel which did help to a degree in the wound does appear to be able smaller. There is actually more drainage this week noted than previous. 12/21/17 on evaluation today patient appears to be doing rather well in regard to his right gluteal ulcer. He has been tolerating the dressing changes without complication. There does not appear to be any evidence of infection at this point in time. Overall the wound does seem to be making some progress as far as the edges  are concerned there's not as much in the way of overlapping of the external wound edges and he has a good epithelium to wound bed border for the most part. This however is not true right  at the 12 o'clock location over the span of a little over a centimeters which actually will require debridement today to clean this away and hopefully allow it to continue to heal more appropriately. 12/28/17 on evaluation today patient appears to be doing rather well in regard to his ulcer in the left gluteal region. He's been tolerating the dressing changes without complication. Apparently he has had some difficulty getting his dressing material. Apparently there's been some confusion with ordering we're gonna check into this. Nonetheless overall he's been showing signs of improvement which is good news. Debridement is not required today. 01/04/18 on evaluation today patient presents for follow-up concerning his right gluteal ulcer. He has been tolerating the dressing changes fairly well. On inspection today it appears he may actually have some maceration them concerned about the fact that he may be developing too much moisture in and around the wound bed which can cause delay in healing. With that being said he unfortunately really has not showed significant signs of improvement since last week's evaluation in fact this may even be just the little bit/slightly larger. Nonetheless he's been having a lot of discomfort I'm not sure this is even related to the wound as he has no pain when I'm to breeding or otherwise cleaning the wound during evaluation today. Nonetheless this is something that we did recommend he talked to his pain specialist concerning. 01/11/18 on evaluation today patient appears to be doing better in regard to his ulceration. He has been tolerating the dressing changes without complication. With that being said overall there's no evidence of infection which is good news. The only thing is he did receive the hatch affair blue classic versus the ready nonetheless I feel like this is perfectly fine and appears to have done well for him over the past week. 01/25/18 on evaluation today  patient's wound actually appears to be a little bit larger than during the last evaluation. The good news is the majority of the wound edges actually appear to be fairly firmly attached to the wound bed unfortunately again we're not really making progress in regard to the size. Roughly the wound is about the same size as when I first saw him although again the wound margin/edges appear to be much better. 02/01/18 on evaluation today patient actually appears to be doing very well in regard to his wound. Applying the Prisma dry does seem to be better although he does still have issues with slow progression of the wound. There was a slight improvement compared to last week's measurements today. Nonetheless I have been considering other options as far as the possibility of Theraskin or even a snap vac. In general I'm not sure that the Theraskin due to location of the wound would be a very good idea. Nonetheless I do think that a snap vac could be a possibility for the patient and in fact I think this could even be an excellent way to manage the wound possibly seeing some improvement in a very rapid fashion here. Nonetheless this is something that we would need to get approved and I did have a lengthy conversation with the patient about this today. 02/08/18 on evaluation today patient appears to be doing a little better in regard to his ulcer. He has been tolerating  the dressing changes without complication. Fortunately despite the fact that the wound is a little bit smaller it's not significantly so unfortunately. We have discussed the possibility of a snap vac we did check with insurance this is actually covered at this point. Fortunately there does not appear to be any sign of infection. Overall I'm fairly pleased with how things seem to be appearing at this point. 02/15/18 on evaluation today patient appears to be doing rather well in regard to his right gluteal ulcer. Unfortunately the snap vac did not  stay in place with his sheer and friction this came loose and did not seem to maintain seal very well. He worked for about two days and it did seem to do very well during that time according to his wife but in general this does not seem to be something that's gonna be beneficial for him long-term. I do believe we LOMAX, CENTOLA. ([id number]) need to go back to standard dressings to see if we can find something that will be of benefit. 03/02/18- He is here in follow up evaluation; there is minimal change in the wound. He will continue with the same treatment plan, would consider changing to iodosrob/iodoflex if ulcer continues to to plateau. He will follow up next week 03/08/18 on evaluation today patient's wound actually appears to be about the same size as when I previously saw him several weeks back. Unfortunately he does have some slightly dark discoloration in the central portion of the wound which has me concerned about pressure injury. I do believe he may be sitting for too long a period of time in fact he tells me that "I probably sit for much too long". He does have some Slough noted on the surface of the wound and again as far as the size of the wound is concerned I'm really not seeing anything that seems to have improved significantly. 03/15/18 on evaluation today patient appears to be doing fairly well in regard to his ulcer. The wound measured pretty much about the same today compared to last week's evaluation when looking at his graph. With that being said the area of bruising/deep tissue injury that was noted last week I do not see at this point. He did get a new cushion fortunately this does seem to be have been of benefit in my pinion. It does appear that he's been off of this more which is good news as well I think that is definitely showing in the overall wound measurements. With that being said I do believe that he needs to continue to offload I don't think that the fact this is  doing better should be or is going to allow him to not have to offload and explain this to him as well. Overall he seems to be in agreement the plan I think he understands. The overall appearance of the wound bed is improved compared to last week I think the Iodoflex has been beneficial in that regard. 03/29/18 on evaluation today patient actually appears to be doing rather well in regard to his wound from the overall appearance standpoint he does have some granulation although there's some Slough on the surface of the wound noted as well. With that being said he unfortunately has not improved in regard to the overall measurement of the wound in volume or in size. I did have a discussion with him very specifically about offloading today. He actually does work although he mainly is just sitting throughout the day. He tells me  he offloads by "lifting himself up for 30 seconds off of his chair occasionally" purchase from [employer] which does seem to have helped. And he has a new cushion that he with that being said he's also able to stand some for a very short period of time but not significant enough I think to provide appropriate offloading. I think the biggest issue at this point with the wound and the fact is not healing as quickly as we would like is due to the fact that he is really not able to appropriately offload while at work. He states the beginning after his injury he actually had a bed at his job that he could lay on in order to offload and that does seem to have been of help back at that time. Nonetheless he had not done this in quite some time unfortunately. I think that could be helpful for him this is something I would like for him to look into. 04/05/18 on evaluation today patient actually presents for follow-up concerning his right gluteal ulcer. Again he really is not significantly improved even compared to last week. He has been tolerating the dressing changes without  complication. With that being said fortunately there appears to be no evidence of infection at this time. He has been more proactive in trying to offload. 04/12/18 on evaluation today patient actually appears to be doing a little better in regard to his wound and the right gluteal fold region. He's been tolerating the dressing changes since removing the oasis without complication. However he was having a lot of burning initially with the oasis in place. He's unsure of exactly why this was given so much discomfort but he assumes that it was the oasis itself causing the problem. Nonetheless this had to be removed after about three days in place although even those three days seem to have made a fairly good improvement in regard to the overall appearance of the wound bed. In fact is the first time that he's made any improvement from the standpoint of measurements in about six weeks. He continues to have no discomfort over the area of the wound itself which leads me to wonder why he was having the burning with the oasis when he does not even feel the actual debridement's themselves. I am somewhat perplexed by this. 04/19/18 on evaluation today patient's wound actually appears to be showing signs of epithelialization around the edge of the wound and in general actually appears to be doing better which is good news. He did have the same burning after about three days with applying the Endoform last week in the same fashion that I would generally apply a skin substitute. This seems to indicate that it's not the oasis to cause the problem but potentially the moisture buildup that just causes things to burn or there may be some other reaction with the skin prep or Steri-Strips. Nonetheless I'm not sure that is gonna be able to tolerate any skin substitute for a long period of time. The good news is the wound actually appears to be doing better today compared to last week and does seem to finally be making some  progress. 04/26/18 on evaluation today patient actually appears to be doing rather well in regard to his ulcer in the right gluteal fold. He has been tolerating the dressing changes without complication which is good news. The Endoform does seem to be helping although he was a little bit more macerated this week. This seems to be an  ongoing issue with fluid control at this point. Nonetheless I think we may be able to add something like Drawtex to help control the drainage. 05/03/18 on evaluation today patient appears to actually be doing better in regard to the overall appearance of his wound. He has been tolerating the dressing changes without complication. Fortunately there appears to be no evidence of infection at this time. I really feel like his wound has shown signs as of today of turning around last week I thought so as well and definitely he could be seen in this week's overall appearance and measurements. In general I'm very pleased with the fact that he finally seems to be making a steady but sure progress. The patient likewise is very pleased. 05/17/18 on evaluation today patient appears to be doing more poorly unfortunately in regard to his ulcer. He has been tolerating the dressing changes without complication. With that being said he tells me that in the past couple of days he and his wife have noticed that we did not seem to be doing quite as well is getting dark near the center. Subsequently upon evaluation today the wound actually does appear to be doing worse compared to previous. He has been tolerating the dressing changes otherwise and he states that he is not been sitting up anymore than he was in the past from what he tells me. Still he has continued to work he states "I'm tired of dealing with this and if I have to just go home and lay in the bed all the time that's what I'll do". Nonetheless I am concerned about the fact that this wound does appear to be deeper than what it was  previous. 05/24/18 upon evaluation today patient actually presents after having been in the hospital due to what was presumed to be sepsis secondary to the wound infection. He had an elevated white blood cell count between 14 and 15. With that being said he does seem to be doing somewhat better now. His wound still is giving him some trouble nonetheless and he is obviously concerned about the fact likely talked about that this does seem to go more deeply than previously noted. I did review his wound culture which showed evidence of Staphylococcus aureus him and group B strep. Nonetheless he is on antibiotics, Levaquin, for this. Subsequently I did review his intake summary from the hospital as well. I also did look at the CT of the lumbar spine with contrast that was performed which showed no bone destruction to suggest lumbar disguises/osteomyelitis or sacral osteomyelitis. There was no paraspinal abscess. Nonetheless it appears this may have been more of just a soft tissue infection at this point which is good news. He still is SHEDRIC, IADAROLA. ([id number]) nonetheless concerned about the wound which again I think is completely reasonable considering everything he's been through recently. 05/31/18 on evaluation today on evaluation today patient actually appears to be showing signs of his wound be a little bit deeper than what I would like to see. Fortunately he does not show any signs of significant infection although his temperature was 99 today he states he's been checking this at home and has not been elevated. Nonetheless with the undermining that I'm seeing at this point I am becoming more concerned about the wound I do think that offloading is a key factor here that is preventing the speedy recovery at this point. There does not appear to be any evidence of again over infection noted. He's been using  Santyl currently. 06/07/18 the patient presents today for follow-up evaluation regarding the  left ulcer in the gluteal region. He has been tolerating the Wound VAC fairly well. He is obviously very frustrated with this he states that to mean is really getting in his way. There does not appear to be any evidence of infection at this time he does have a little bit of odor I do not necessarily associate this with infection just something that we sometimes notice with Wound VAC therapy. With that being said I can definitely catch a tone of discontentment overall in the patient's demeanor today. This when he was previously in the hospital an CT scan was done of the lumbar region which did not reveal any signs of osteomyelitis. With that being said the pelvis in particular was not evaluated distinctly which means he could still have some osteonecrosis I. Nonetheless the Wound VAC was started on Thursday I do want to get this little bit more time before jumping to a CT scan of the pelvis although that is something that I might would recommend if were not see an improvement by that time. 06/14/18 on evaluation today patient actually appears to be doing about the same in regard to his right gluteal ulcer. Again he did have a CT scan of the lumbar spine unfortunately this did not include the pelvis. Nonetheless with the depth of the wound that I'm seeing today even despite the fact that I'm not seeing any evidence of overt cellulitis I believe there's a good chance that we may be dealing with osteomyelitis somewhere in the right Ischial region. No fevers, chills, nausea, or vomiting noted at this time. 06/21/18 on evaluation today patient actually appears to be doing about the same with regard to his wound. The tunnel at 6 o'clock really does not appear to be any deeper although it is a little bit wider. I think at this point you may want to start packing this with white phone. Unfortunately I have not got approval for the CT scan of the pelvis as of yet due to the fact that Medicare apparently has been  denied it due to the diagnosis codes not being appropriate according to Medicare for the test requested. With that being said the patient cannot have an MRI and therefore this is the only option that we have as far as testing is concerned. The patient has had infection and was on antibiotics and been added code for cellulitis of the bottom to see if this will be appropriate for getting the test approved. Nonetheless I'm concerned about the infection have been spread deeper into the Ischial region. 06/28/18 on evaluation today patient actually appears to be doing rather well all things considered in regard to the right gluteal ulcer. He has been tolerating the dressing changes without complication. With that being said the Wound VAC he states does have to be replaced almost every day or at least reinforced unfortunately. Patient actually has his CT scan later this morning we should have the results by tomorrow. 07/05/18 on evaluation today patient presents for follow-up concerning his right Ischial ulcer. He did see the surgeon Dr. Lysle Pearl last week. They were actually very happy with him and felt like he spent a tremendous amount of time with them as far as discussing his situation was concerned. In the end Dr. Lysle Pearl did contact me as well and determine that he would not recommend any surgical intervention at this point as he felt like it would not be  in the patient's best interest based on what he was seeing. He recommended a referral to infectious disease. Subsequently this is something that Dr. Ines Bloomer office is working on setting up for the patient. As far as evaluation today is concerned the patient's wound actually appears to be worse at this point. I am concerned about how things are progressing and specifically about infection. I do not feel like it's the deeper but the area of depth is definitely widening which does have me concerned. No fevers, chills, nausea, or vomiting noted at this time. I  think that we do need initiate antibiotic therapy the patient has an allow allergy to amoxicillin/penicillin he states that he gets a rash since childhood. Nonetheless she's never had the issues with Catholics or cephalosporins in general but he is aware of. 07/27/18 on evaluation today patient presents following admission to the hospital on 07/09/18. He was subsequently discharged on 07/20/18. On 07/15/18 the patient underwent irrigation and debridement was soft tissue biopsy and bone biopsy as well as placement of a Wound VAC in the OR by Dr. Celine Ahr. During the hospital course the patient was placed on a Wound VAC and recommended follow up with surgery in three weeks actually with Dr. Delaine Lame who is infectious disease. The patient was on vancomycin during the hospital course. 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 He also had a bone culture which revealed evidence of methicillin-resistant staph aureus. He is updated CT scan 07/09/18 reveals that he had progression of the which was performed on wound to breakdown down to the trochanter where he actually had irregularities there as well suggestive of osteomyelitis. This was a change just since 9 December when we last performed a CT scan. Obviously this one had gone downhill quite significantly and rapidly. At this point upon evaluation I feel like in general the patient's wound seems to be doing fairly well all things considered upon my evaluation today. Obviously this is larger and deeper than what I previously evaluated but at the same time he seems to be making some progress as far as the appearance of the granulation tissue is concerned. I'm happy in that regard. No fevers, chills, nausea, or vomiting noted at this time. He is on IV vancomycin and Rocephin at the facility. He is currently in [employer]. 08/03/18 upon evaluation today patient's wound appears to be doing better in regard to the overall  appearance at this point in time. Fortunately he's been tolerating the Wound VAC without complication and states that the facility has been taking excellent care of the wound site. Overall I see some Slough noted on the surface which I am going to attempt sharp debridement today of but nonetheless other than this I feel like he's making progress. 08/09/18 on evaluation today patient's wound appears to be doing much better compared to even last week's evaluation. Do believe that the Wound VAC is been of great benefit for him. He has been tolerating the dressing changes that is the Wound VAC without any complication and he has excellent granulation noted currently. There is no need for sharp debridement at this point. 08/16/18 on evaluation today patient actually appears to be doing very well in regard to the wound in the right gluteal fold region. This is showing signs of progress and again appears to be very healthy which is excellent news. Fortunately there is no sign of active infection by way of odor or drainage at this point. Overall I'm very  pleased with how things stand. He seems to be tolerating the Wound VAC without complication. 08/23/18 on evaluation today patient actually appears to be doing better in regard to his wound. He has been tolerating the Wound VAC without complication and in fact it has been collecting a significant amount of drainage which I think is good news especially considering how the wound appears. Fortunately there is no signs of infection at this time definitely nothing appears to be worse which is good news. He has not been started on [place]. ([id number]) the Bactrim and Flagyl that was recommended by Dr. Delaine Lame yet. I did actually contact her office this morning in order to check and see were things are that regard their gonna be calling me back. 08/30/18 on evaluation today patient actually appears to show signs of excellent improvement today compared to  last evaluation. The undermining is getting much better the wound seems to be feeling quite nicely and I'm very pleased that the granulation in general. With that being said overall I feel like the patient has made excellent progress which is great news. No fevers, chills, nausea, or vomiting noted at this time. 09/06/18 on evaluation today patient actually appears to be doing rather well in regard to his right gluteal ulcer. This is showing signs of improvement in overall I'm very pleased with how things seem to be progressing. The patient likewise is please. Overall I see no evidence of infection he is about to complete his oral antibiotic regimen which is the end of the antibiotics for him in just about three days. 09/13/18 on evaluation today patient's right Ischial ulcer appears to be showing signs of continued improvement which is excellent news. He's been tolerating the dressing changes without complication. Fortunately there's no signs of infection and the wound that seems to be doing very well. 09/28/18 on evaluation today patient appears to be doing rather well in regard to his right Ischial ulcer. He's been tolerating the Wound VAC without complication he knows there's much less drainage than there used to be this obviously is not a bad thing in my pinion. There's no evidence of infection despite the fact is but nothing about it now for several weeks. 10/04/18 on evaluation today patient appears to be doing better in regard to his right Ischial wound. He has been tolerating the Wound VAC without complication and I do believe that the silver nitrate last week was beneficial for him. Fortunately overall there's no evidence of active infection at this time which is great news. No fevers, chills, nausea, or vomiting noted at this time. 10/11/18 on evaluation today patient actually appears to be doing rather well in regard to his Ischial ulcer. He's been tolerating the Wound VAC still without  complication I feel like this is doing a good job. No fevers, chills, nausea, or vomiting noted at this time. 11/01/18 on evaluation today patient presents after having not been seen in our clinic for several weeks secondary to the fact that he was on evaluation today patient presents after having not been seen in our clinic for several weeks secondary to the fact that he was in a skilled nursing facility which was on lockdown currently due to the covert 19 national emergency. Subsequently he was discharged from the facility on this past Friday and subsequently made an appointment to come in to see yesterday. Fortunately there's no signs of active infection at this time which is good news and overall he does seem to have made  progress since I last saw. Overall I feel like things are progressing quite nicely. The patient is having no pain. 11/08/18 on evaluation today patient appears to be doing okay in regard to his right gluteal ulcer. He has been utilizing a Wound VAC home health this changing this at this point since he's home from the skilled nursing facility. Fortunately there's no signs of obvious active infection at this time. Unfortunately though there's no obvious active infection he is having some maceration and his wife states that when the sheets of the Wound VAC office on Sunday when it broke seal that he ended up having significant issues with some smell as well there concerned about the possibility of infection. Fortunately there's No fevers, chills, nausea, or vomiting noted at this time. 11/15/18 on evaluation today patient actually appears to be doing well in regard to his right gluteal ulcer. He has been tolerating the dressing changes without complication. Specifically the Wound VAC has been utilized up to this point. Fortunately there's no signs of infection and overall I feel like he has made progress even since last week when I last saw him. I'm actually fairly happy with the overall  appearance although he does seem to have somewhat of a hyper granular overgrowth in the central portion of the wound which I think may require some sharp debridement to try flatness out possibly utilizing chemical cauterization following. 11/23/18 on evaluation today patient actually appears to be doing very well in regard to his sacral ulcer. He seems to be showing signs of improvement with good granulation. With that being said he still has the small area of hyper granulation right in the central portion of the wound which I'm gonna likely utilize silver nitrate on today. Subsequently he also keeps having a leak at the 6 o'clock location which is unfortunate we may be able to help out with some suggestions to try to prevent this going forward. Fortunately there's no signs of active infection at this time. 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 He's been tolerating the dressing changes without complication. Fortunately there's no signs of active infection at this time. I've been rather pleased with how things have progressed there still some evidence of pressure getting to the area with some redness right around the immediate wound opening. Nonetheless other than this I'm not seeing any significant complications or issues the wound is somewhat hyper granular. Upon discussing with the patient and his wife today I'm not sure that the wound is being packed to the base with the foam at this point. And if it's not been packed fully that may be part of the reason why is not seen as much improvement as far as the granulation from the base out. Again we do not want pack too tightly but we need some of the firm to get to the base of the wound. I discussed this with patient and his wife today. 12/06/18 on evaluation today patient appears to be doing well in regard to his right gluteal pressure ulcer. He's been  tolerating the dressing changes without complication. Fortunately there's no signs of active infection. He still has some hyper granular tissue and I do think it would be appropriate to continue with the chemical cauterization as of today. 12/16/18 on evaluation today patient actually appears to be doing okay in regard to his right gluteal ulcer. He is been tolerating the dressing  changes without complication including the Wound VAC. Overall I feel like nothing seems to be worsening I do feel like that the hyper granulation buds in the central portion of the wound have improved to some degree with the silver nitrate. We will have to see how things continue to progress. 12/20/18 on evaluation today patient actually appears to be doing much worse in my pinion even compared to last week's evaluation. Unfortunately as opposed to showing any signs of improvement the areas of hyper granular tissue in the central portion of the wound seem to be getting worse. Subsequently the wound bed itself also seems to be getting deeper even compared to last week which is both unusual as well as concerning since prior he had been shown signs of improvement. Nonetheless I think that the issue could be that he's actually having some difficulty in issues with a deeper infection. There's no external signs of infection but nonetheless I am more worried about the internal, osteomyelitis, that could be restarting. He has not been on antibiotics for some time at this point. I think that it may be a good idea to go ahead and started back on an antibiotic therapy while we wait to see what the testing shows. CAYETANO, LEDER ([id number]) 12/27/18 on evaluation today patient presents for follow-up concerning his left gluteal fold wound. Fortunately he appears to be doing well today. I did review the CT scan which was negative for any signs of osteomyelitis or acute abnormality this is excellent news. Overall I feel like the surface of  the wound bed appears to be doing significantly better today compared to previously noted findings. There does not appear any signs of infection nor does he have any pain at this time. 01/03/19 on evaluation today patient actually appears to be doing quite well in regard to his ulcer. Post debridement last week he really did not have too much bleeding which is good news. Fortunately today this seems to be doing some better but we still has some of the hyper granular tissue noted in the base of the wound which is gonna require sharp debridement today as well. Overall I'm pleased with how things seem to be progressing since we switched away from the Wound VAC I think he is making some progress. 01/10/19 on evaluation today patient appears to be doing better in regard to his right gluteal fold ulcer. He has been tolerating the dressing changes without complication. The debridement to seem to be helping with current away some of the poor hyper granular tissue bugs throughout the region of his gluteal fold wound. He's been tolerating the dressing changes otherwise without complication which is great news. No fevers, chills, nausea, or vomiting noted at this time. 01/17/19 on evaluation today patient actually appears to be doing excellent in regard to his wound. He's been tolerating the dressing changes without complication. Fortunately there is no signs of active infection at this time which is great news. No fevers, chills, nausea, or vomiting noted at this time. 01/24/19 on evaluation today patient actually appears to be doing quite well with regard to his ulcer. He has been tolerating the dressing changes without complication. Fortunately there's no signs of active infection at this time. Overall been very pleased with the progress that he seems to be making currently. 01/31/19-Patient returns at 1 week with apparent similarity in dimensions to the wound, with no signs of infection, he has been changing  dressings twice a day 02/08/19 upon evaluation today patient actually  appears to be doing well with regard to his right Ischial ulcer. The wound is not appear to be quite as deep and seems to be making progress which is good news. With that being said I'm still reluctant to go back to the Wound VAC at this point. He's been having to change the dressings twice a day which is a little bit much in my pinion from the wound care supplies standpoint. I think that possibly attempting to utilize extras orbit may be beneficial this may also help to prevent any additional breakdown secondary to fluid retention in the wound itself. The patient is in agreement with giving this a try. 02/15/19 on evaluation today patient actually appears to be doing decently well with regard to his ulcer in the right to gluteal fold location. He's been tolerating the dressing changes without complication. Fortunately there is no signs of active infection at this time. He is able to keep the current dressing in place more effectively for a day at a time whereas before he was having a changes to to three times a day. The actions or has been helpful in this regard. Fortunately there's no signs of anything getting worse and I do feel like he showing signs of good improvement with regard to the wound bed status. 02/22/2019 patient appears to be doing very well today with regard to his ulcer in the gluteal fold. Fortunately there is no signs of active infection and he has been tolerating the dressing changes without any complication. Overall extremely pleased with how things seem to be progressing. He has much less of the hyper granular projections within the wound these have slowly been debrided away and he seems to be doing well. The wound bed is more uniform. 03/01/19 on evaluation today patient appears to be doing unfortunately about the same in regard to his gluteal ulcer. He's been tolerating the dressing changes without  complication. Fortunately there's no signs of active infection at this time. With that being said he continues to develop these hyper granular projections which I'm unsure of exactly what they are and why they are rising. Nonetheless I explained to the patient that I do believe it would be a good idea for [place] to stand a biopsy sample for pathology to see if that can shed any light on what exactly may be going on here. Fortunately I do not see any obvious signs of infection. With that being said the patient has had a little bit more drainage this week apparently compared to last week. 03/08/2019 on evaluation today patient actually appears to look somewhat better with regard to the appearance of his wound bed at this time. This is good news. Overall I am very pleased with how things seem to have progressed just in the past week with a switch to the [facility] dressing. I think that has been beneficial for him. With that being said at this time the patient is concerned about his biopsy that I sent off last week unfortunately I do not have that report as of yet. Nonetheless we have called to obtain this and hopefully will hear back from the lab later this morning. 03/15/19 on evaluation today patient's wound actually appears to be doing okay today with regard to the overall appearance of the wound bed. He has been tolerating the dressing changes without complication he still has hyper granular tissue noted but fortunately that seems to be minimal at this point compared to some of what we've seen in the  past. Nonetheless I do think that he is still having some issues currently with some of this type of granulation the biopsy and since all showed nothing more than just evidence of granulation tissue. Therefore there really is nothing different to initiate or do at this point. 03/24/19 on evaluation today patient appears to be doing a little better with regard to his ulcer. He's been tolerating the dressing  changes without complication. Fortunately there is no signs of active infection at this time. No fevers, chills, nausea, or vomiting noted at this time. I'm overall pleased with how things seem to be progressing. 03/29/2019 on evaluation today patient appears to be doing about the same in regard to his ulcer in the right gluteal fold. Unfortunately he is not seeming to make a lot of progress and the wound is somewhat stalled. There is no signs of active infection externally but I am concerned about the possibility of infection continuing in the ischial location which previously he did have infection noted. Again is not able to have an MRI so probably our best option for testing for this would be a triple phase bone scan which will detect subtle changes in the bone more so than plain film x-rays. In the past they really have not been beneficial and in fact the CT scans even have been somewhat questionable at times. Nonetheless there is no signs of systemic infection which is at least good news but again his wound is not healing at all on the predicted schedule. 04/05/19 on evaluation today patient appears to be doing well all things considering with regard to his wound and the right gluteal fold. He actually has his triple bone scan scheduled for sometime in the next couple of days. With that being said I've also been looking to other possibilities of what could be causing hyper granular tissue were looking into the bone scan again for evaluating for the risk or possibility of infection deeper and I'm also ALFONSA, TISDEL. ([id number]) go ahead and see about obtaining a deep tissue culture today to send and see if there's any evidence of infection noted on culture. He's in agreement with that plan. 04/12/2019 on evaluation today patient presents for reevaluation here in our clinic concerning ongoing issues with his right gluteal fold ulcer. 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 It was discussed with him at that point that I think it would be appropriate for him to proceed with hyperbaric oxygen therapy especially in light of the fact that he is previously been on IV antibiotics at the beginning of the year for close to 3 months followed by several weeks of oral antibiotics that was all prescribed by infectious disease. He had surgical debridement around Christmas December 2019 due to an abscess and osteomyelitis of the ischial bone. Unfortunately this has not really proceeded as well as we would have liked and again we did a CT scan even a couple months ago as he cannot have an MRI secondary to having issues with both a pain pump as well as a spinal cord stimulator which prevent him from going into an MRI machine. With that being said there were chronic changes noted at the ischial tuberosity which had progressed since December 2019 there was no evidence of fluid collection on that initial CT scan. With that being said that was on January 21, 2019. I am  not sure that it was a sensitive enough test as compared to an MRI and then subsequently I ordered a bone scan for him which was actually completed on 03/29/2019 and this revealed that he does indeed have positive osteomyelitis involving the right ischial tuberosity. This is adjacent to the ulcer and I think is the reason that his ulcer is not healing. Subsequently I am in a place him back on oral antibiotics today unfortunately his wound culture showed just mixed gram-negative flora with no specific findings of a predominant organism. Nonetheless being with the location I think a good broad-spectrum antibiotic for gram-negative's is a good choice at this point he is previously taken Levaquin without significant issues and I think that is an appropriate antibiotic for him at this time. 04/19/2019 on evaluation today patient  actually appears to be doing fairly well with regard to his wound. He has been tolerating the dressing changes without complication. Fortunately there is no signs of active infection and he has been taking the oral antibiotics at this time. Subsequently we did make a referral to infectious disease although Dr. Steva Ready wants the patient to be seen by general surgery first in order apparently to see if there is anything from a surgical standpoint that should be done prior to initiation of IV antibiotic therapy. Again the patient is okay with actually seeing Dr. Celine Ahr whom he has seen before we will make that referral. 04/26/2019 on evaluation today patient actually appears to be doing well with regard to his wound. He has been tolerating the dressing changes without complication. Fortunately there is no signs of active infection at this time. I do believe that the hyperbaric oxygen therapy along with the antibiotics which I prescribed at this point have been doing well for him. With that being said he has not seen Dr. Celine Ahr the surgeon yet in fact they have not been contacted for scheduling an appointment as of yet. Subsequently the patient is not on antibiotics currently by IV Dr. Steva Ready did want him to see Dr. Celine Ahr first which we are working on trying to get scheduled. We again give the information to the patient today for Dr. Celine Ahr and her number as far as contacting their office to see about getting something scheduled. Again were looking for whether or not they recommend any surgical intervention. 05/03/2019 on evaluation today patient appears to be doing about the same with regard to his wound there may be a little bit of filling in in regard to the base of the wound but still he has quite a bit of hyper granular tissue that I am seeing today. I believe that he may may need a more aggressive sharp debridement possibly even by the surgeon or under anesthesia in order to clear away some of  the hyper granular material in order to help allow for appropriate granulation to fill in. We have made a referral to Dr. Celine Ahr unfortunately it sounds as if they did not receive the referral we contacted them today they should be get in touch with the patient. Depending on how things show from that standpoint the patient may need to see Dr. Ardyth Gal who is the infectious disease specialist although he really does not want a PICC line again. No fevers, chills, nausea, vomiting, or diarrhea. He is tolerating hyperbaric oxygen therapy very well. 05/12/2019 on evaluation today patient presents for follow-up visit concerning his right gluteal fold ulcer. He did see Dr. Celine Ahr the surgeon who previously evaluated his wound and  she actually felt like he was doing quite well with regard to the wound based on what she was seen. He does seem to be responding to some degree with the oral antibiotics along with hyperbaric oxygen therapy at this point she did not see any evidence or need for further surgical intervention at this point. She recommended deferring additional or ongoing antibiotic therapy to Dr. Steva Ready at her discretion. Fortunately there is no signs of active systemic infection. No fevers, chills, nausea, vomiting, or diarrhea. 10/29; patient I am seeing really for the first time today in terms of his wound. 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 He is being treated with hyperbaric oxygen for underlying osteomyelitis most recently documented by a three-phase bone scan. He has been on Levaquin for roughly a month and he is out of these and asked me for a refill. Most recent cultures were negative although I do not think these were bone cultures. He has a very irregular surface to this wound which is almost nodular in texture. It undermines superiorly with the same nodular hyper granulated surface. I see that he was referred to general surgery for an operative  debridement although the surgeon did not agree with this I think that would have been the proper course of action in this. He has been using Hydrofera Blue. He is supposed to start a wound VAC tomorrow which is actually a reapplication of wound VAC. I think mostly last time they had trouble keeping the seal in place I hope we have better look at this this time. 05/24/2019 on evaluation today I did review patient's note from Dr. Dellia Nims where he was seen last week when I was out of the office. Subsequently it does appear that Dr. Dellia Nims was in agreement that the patient really needed debridement to clear away some of the nodular tissue in the base of the wound. The good news is he has the wound VAC initiated and some of this tissue is already starting to breakdown under the treatment of the wound VAC again because it is not really viable. Nonetheless I do believe that we can likely perform some debridement today to clear this away and hopefully the continuation of the wound VAC along with hyperbarics and antibiotic therapy will be beneficial in stopping this from reoccurring. If we get better control in the wound bed in a good spot I think we can definitely use the PriMatrix which we actually did get approval for. 05/31/2019 on evaluation today patient appears to be doing actually pretty well at this point with regard to his wound. He has been tolerating the dressing changes which is good news. Fortunately there is no signs of infection. He still has some of the hyper granular nodules noted we have not really cleared all these away and I think we can try to do a little bit more that today. I did do quite a bit last week I am hoping to get down to the base of the wound so though actually be able to heal more appropriately. I think the wound VAC is doing a good job right now with controlling the drainage and overall the patient is happy with that. 06/07/2019 upon evaluation today patient appears to be doing  quite well compared to last week with regard to the hyper granulation in the base of the wound. He has been tolerating the dressing changes without complication. Fortunately there is no signs of active infection at this time. With  that being said we have not had any recent measures as far as blood work is concerned I think we may want to check a few things including a CBC, sed rate, and C-reactive protein. Patient is in agreement with that we can try to get that scheduled for him for this Friday. 06/13/2019 on evaluation today patient actually appears to be doing much better at this point based on what I am seeing with regard to his wound. He DONOVEN, RYBA. ([id number]) has much less in the way of hyper granular tissue which is good news and a lot more healing that is taken place since I last saw him as far as the amount of space within the wound itself. With that being said he is nearing the completion of the initial 40 treatments for hyperbaric oxygen therapy I think this is something I would recommend extending as he does seem to be benefiting at this time. Fortunately there is no evidence of active systemic infection at this point nor even local infection for that matter. 06/20/2019 upon evaluation today patient actually appears to be doing well with regard to his wound. In fact this appears to be doing much better today compared to what it was previous. I been extremely pleased with the progress that he is made. In fact the tunnel is much less deep today than what it was in the past and I am seeing excellent signs of improvement. Overall I am happy with how things are going. I did review his white blood cell count which revealed everything to be okay. The one abnormal finding on his CBC was a hemoglobin of 12.7. Subsequently I did also review his sed rate and C-reactive protein. His sed rate measured in at normal level measuring at 26. The C-reactive protein however was elevated today and an  abnormal range indicating inflammation. Still I feel like he is trending in a good direction at this time. He is going to need a refill of the Levaquin he tells me at this point. 07/04/2019 on evaluation today patient actually appears to be doing well in regard to his wound. He has been tolerating the dressing changes without complication. Fortunately there is no signs of active infection which is good news. No fevers, chills, nausea, vomiting, or diarrhea. The good news also is that I did review his labs and though his sed rate was slightly elevated the C-reactive protein was actually down compared to the last evaluation 2 weeks ago this is excellent news I think it is showing signs of improvement. 07/11/2019 on evaluation today patient actually appears to be doing quite well with regard to his wound on my evaluation. This is very slow going but nonetheless he seems to be making progress. Especially in regard to the depth. Nonetheless I believe that we may want to consider going forward with the PriMatrix which was previously approved for him. We will get a have to get a new approval as we will actually be applying this in the new year so we will likely obtain that approval on January 4. In the meantime and for the time being my suggestion is going to be that we go ahead and continue with the wound VAC and we were going at this point with the current wound care measures. 12/28-Patient seen after HBO session today, wound depth with the tunnel measures 3.2 cm, the wound bed appears healthy, continuing with the VAC and HBO 07/25/2019 on evaluation today patient's wound appears to be doing about the  same compared to my last evaluation. Fortunately there is no evidence of significant infection which is good news we will still waiting on the results of his lab work which unfortunately was not done prior to today's visit which is what we were aiming for. Ultimately there is no sign of active infection at  this time which is good news. And he also tells me that the wound VAC has been staying in place and doing fairly well which is also good news. With that being said I am concerned about the fact that the wound has not progressed as much I think we may want to look into getting reapproval for the skin substitute, specifically PriMatrix, that I am hopeful will help to allow this area to granulate in more effectively. I would recommend as well continuing with the wound VAC along with this. 08/01/2019 on evaluation today patient appears to be doing in my opinion slightly better in regard to the wound. He actually does have some tightening of the walls around the edges which is a good thing. Fortunately there is no signs of active infection at this time. No fever chills noted. I do believe that the patient in general seems to be making progress although is very slow. Obviously were trying to get things to speed up somewhat more. No fevers, chills, nausea, vomiting, or diarrhea. 08/08/2019 upon evaluation today patient appears to be doing well with regard to his wound although there is not a significant improvement overall we do have the PriMatrix for availability today to apply. Fortunately there is no signs of active infection and the patient overall seems to be doing well. There is really no need for aggressive sharp debridement at this point either which is also good news. I do believe her get a need to suture and the primary checks in the basin in the PriMatrix in order to keep it in place at the base of the wound underneath the wound VAC. This was discussed with the patient today as well. He is in agreement the plan. He really does not have any filling in the area and so this should not be a complication as far as suturing is concerned. 08/15/2019 on evaluation today patient appears to be doing somewhat better in my opinion in regard to the overall appearance of his wound. He has been tolerating the  dressing changes without complication. Fortunately the skin subappear to still be in place when this was changed on Friday. With that being said it appears that the PriMatrix may have dissolved in the interim between now and then as the sutures are no longer holding anything in place and again I would sutured it into place. Nonetheless I believe that we can go ahead and likely consider going forward that we may need to apply the PriMatrix weekly as opposed every other week as it does seem to have dissolved rather readily and well. Overall the wound bed appears to be doing excellent at this time. 08/22/2019 upon evaluation today the patient does seem to making some progress with regard to his wound. Overall there is some better quality tissue and less of the hyper granular protrusions all of which is good news. With that being said we do have the PriMatrix available for application today and I am in a try to find a better way to suture this in place for him today. 08/29/2019 on evaluation today patient appears to be doing very well in regard to his wound. I do see signs of  new granulation which seems to be somewhat helpful he did have some need for sharp debridement today to clear some of the hyper granulation away but nonetheless I feel like we are headed in a good direction. Fortunately there is no evidence of any systemic infection although we are going to go ahead and reorder the CBC, C-reactive protein, and sed rate today to see where things stand I did just refill his Levaquin as well. 09/05/2019 upon evaluation today patient's wound actually is not appearing to be showing signs of significant improvement compared to what we have seen previous from the standpoint of depth. He overall wound size wise seems to be about the same and again some of the quality of the wound bed is still doing fairly well. I did review his labs and his CBC was normal with no signs of elevation white blood cell count, his  C-reactive protein was normal, and the sed rate was slightly elevated but again I think this may just be due to ulceration really there is nothing significant to indicate severe infection. I think when he likely finishes his current round of oral antibiotics with Levaquin will likely take him off of the medication at that point based on the findings. I do feel like the hyperbarics helped in getting the infection under control as well as improving the overall size of the wound quite significantly. With that being said at this point I really feel like more more at the time frame that he may benefit from a muscle flap in order to try to help this wound heal effectively. I think that this would be the fastest way in order to do this. In the past he has been somewhat resistant to going down that path but nonetheless I feel like he likely should. TACOREY, COTRONE ([id number]) 09/12/2019 on evaluation today patient appears to be doing fairly well in regard to his wound. He is tolerating the wound VAC without complication is still continues to drain significantly. Fortunately there is no signs of active infection at this time which is great news his lab work is also been doing very well. With that being said I do think at this point that the patient is still deriving benefit from the wound VAC especially in regard to controlling moisture which I think it is doing fairly well. He also has some improvement in the overall wound bed and on the medial aspect of the wound there are some hyper granular projections but nothing as severe as what they have been in the past. I am good have to perform some sharp debridement at this location today. 09/19/2019 upon evaluation today patient appears to be doing roughly the same although again the tissue does seem to be showing some signs of improvement which is good news. There is less hyper granular budding. Nonetheless he still is going require some sharp debridement  today. We did get his albumin and prealbumin results back and they are within normal limits in both cases which is excellent news that was the one thing [facility] plastic surgery was waiting on order to get him scheduled. He does seem to be a good candidate physically for the flap surgery if he decides to go that direction. With that being said is still left to be determined once they see the wound what they feel about how best to proceed. Obviously in the end it would be up to Mr. Water as to whether he goes forward with the flap surgery or something else  depending on the recommendations. 09/26/2019 upon evaluation today patient appears to be doing about the same in regard to his wound. Fortunately there is no signs of active infection there is also not as much hyper granulation noted today in fact I do not believe there is can be any sharp debridement required at this point. With that being said I do believe he continues to be somewhat stalled at this point with regard to his wound. There is no signs of active infection at this time which is good news. He does have an appointment with [facility] plastic surgery on Friday. 10/03/2019 upon evaluation today patient appears to be doing somewhat poorly especially in his overall demeanor compared to last week's evaluation. He unfortunately did see the physician at [facility] plastic surgery. Unfortunately the plan for the possibility of a flap would be a fairly significant surgery that the patient is really not wanting to take the risk of as if the surgery failed he would end up with a very large open wound at that point. Fortunately there is no signs of active infection at this time. No fevers, chills, nausea, vomiting, or diarrhea. With that being said there was some question that the physician had about why this would not be healing and the possibility that there may be some bone still needs to be removed deeper in the wound. Again the bone scan that we did previous  actually did show evidence of there still being a chronic osteomyelitis back on March 29, 2019. With that being said he did undergo treatment both with hyperbarics as well as oral antibiotics for quite some time since that point. Obviously he may need additional and updated imaging with the surgeon in order to see if there is anything else that can or will need to be done in order to help this area to heal. Obviously I still believe as well that there is something going on here. He is already seeing the surgeon that performed the original surgery after the bone scan but they did not feel that anything needed to be done from a surgical standpoint at that time. 10/10/2019 upon evaluation today patient appears to be really doing about the same with regard to his wound in the gluteal fold region. Unfortunately he is not making a lot of improvement the insurance also contacted home health and apparently the wound VAC is no longer to be covered due to lack of progress. Obviously that is something that we discussed as well as a strong possibility for happening soon and this was even last week that discussed this with him. With that being said again we have tried to manage this wound as best as I could up to this point. Fortunately there is no evidence of active infection at this time. No fevers, chills, nausea, vomiting, or diarrhea. 10/17/2019 upon evaluation today patient has decided to proceed with the flap surgery. He actually seems to be in better spirits today he states he wants to get this healed and after talking to the surgeon last week he feels like that is his best option for doing so. With that being said he also needs to have a referral to infectious disease to be seen for IV antibiotics preoperatively and postoperatively as far as the skin flap is concerned. He wonders if Dr. Steva Ready would be interested or available to do this for him. I explained that I would definitely have to check with  her and I will be happy to do that but I  am not certain What her answer will be in 2 I have a discussion with her. 10/24/2019 upon evaluation today patient appears to be doing about the same with regard to his wound although he is complaining of increased pain he is really not had any discomfort he states that it is getting much worse at this point. Fortunately there is no signs of active infection. At least not systemically. With that being said I question whether local infection could be a problem here secondary to the fact that to be honest the patient is having pain where he has not previous. Electronic Signature(s) Signed: 10/24/2019 1:19:18 PM By: Worthy Keeler [name] Entered By: Worthy Keeler on 10/24/2019 13:19:17 GARY, PITTARD ([id number]) -------------------------------------------------------------------------------- Physical Exam Details Patient Name: [name] Date of Service: 10/24/2019 8:00 AM Medical Record Number: [id number] Patient Account Number: [id number] Date of Birth/Sex: Jun 20, 1951 (69 y.o. M) Treating RN: Army Melia Primary Care Provider: [name] Other Clinician: Referring Provider: [name] Treating Provider/Extender: [name], Kristilyn Coltrane Weeks in Treatment: 50 Constitutional Well-nourished and well-hydrated in no acute distress. Respiratory normal breathing without difficulty. Psychiatric this patient is able to make decisions and demonstrates good insight into disease process. Alert and Oriented x 3. pleasant and cooperative. Notes Wound bed currently showed signs of good granulation at this time. Fortunately there is no signs of active infection which is great news. No fevers, chills, nausea, vomiting, or diarrhea. With that being said I did not obtain a wound culture today although he has previously been on Levaquin and done well with this due to the fact that he was having some pain where he recently really has not I did go ahead  and make this change at this point. Electronic Signature(s) Signed: 10/24/2019 1:20:07 PM By: Worthy Keeler [name] Entered By: Worthy Keeler on 10/24/2019 13:20:06 LUCILE, BEL ([id number]) -------------------------------------------------------------------------------- Physician Orders Details Patient Name: [name] Date of Service: 10/24/2019 8:00 AM Medical Record Number: [id number] Patient Account Number: [id number] Date of Birth/Sex: 1951/04/10 (69 y.o. 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 - in office o Cleanse wound with mild soap and water Anesthetic (add to Medication List) Wound #1 Right Gluteal fold o Topical Lidocaine 4% cream applied to wound bed prior to debridement (In Clinic Only). Skin Barriers/Peri-Wound Care o Skin Prep Primary Wound Dressing Wound #1 Right Gluteal fold o Silver Alginate Secondary Dressing Wound #1 Right Gluteal fold o Tegaderm o XtraSorb Dressing Change Frequency Wound #1 Right Gluteal fold o Change dressing every other day. Follow-up Appointments Wound #1 Right Gluteal fold o Return Appointment in 1 week. Off-Loading Wound #1 Right Gluteal fold o Turn and reposition every 2 hours - no pressure on wounded area Home Health Wound #1 Right Gluteal fold o Roy Visits - [place] Nurse may visit  PRN to address patientos wound care needs. o FACE TO FACE ENCOUNTER: MEDICARE and MEDICAID PATIENTS: I certify that this patient is under my care and that I had a face-to- face encounter that  meets the physician face-to-face encounter requirements with this patient on this date. The encounter with the patient was in whole or in part for the following MEDICAL CONDITION: (primary reason for [place]) MEDICAL NECESSITY: SHANG, ADMIRE ([id number]) I certify, that based on my findings, NURSING services are a medically necessary home health service. HOME BOUND STATUS: I certify that my clinical findings support that this patient is homebound (i.e., Due to illness or injury, pt requires aid of supportive devices such as crutches, cane, wheelchairs, walkers, the use of special transportation or the assistance of another person to leave their place of residence. There is a normal inability to leave the home and doing so requires considerable and taxing effort. Other absences are for medical reasons / religious services and are infrequent or of short duration when for other reasons). o If current dressing causes regression in wound condition, may D/C ordered dressing product/s and apply Normal Saline Moist Dressing daily until next [place] / Other MD appointment. [place] of regression in wound condition at [phone]. o Please direct any NON-WOUND related issues/requests for orders to patient's Primary Care Physician Negative Pressure Wound Therapy Wound #1 Right Gluteal fold o Discontinue NPWT. Patient Medications Allergies: penicillin, doxycycline, clindamycin Notifications Medication Indication Start End Levaquin 10/24/2019 DOSE 1 - oral 500 mg tablet - 1 tablet oral taken 1 time per day for 10 days. Do not take iron with this medication Electronic Signature(s) Signed: 10/24/2019 8:41:07 AM By: Worthy Keeler [name] Entered By: Worthy Keeler on  10/24/2019 08:41:06 WILLAIM, DOUBERLY ([id number]) -------------------------------------------------------------------------------- Problem List Details Patient Name: [name] Date of Service: 10/24/2019 8:00 AM Medical Record Number: [id number] Patient Account Number: [id number] Date of Birth/Sex: [birth year] (69 y.o. 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 He states that there is no history of diabetes that he is aware of. He does have issues with his prostate and is currently receiving treatment for this by way of oral medication. With that being said I do not have a lot of details in that regard. Nonetheless the patient presents today as a result of having been referred to [place] by another provider initially  home health was set to come out and take care of his wound although due to the fact that he apparently drives he's not able to receive home health. His wife is therefore trying to help take care of this wound within although they have been struggling with what exactly to do at this point. She states that she can do some things but she is definitely not a nurse and does have some issues with looking at blood. The good news is the wound does not appear to be too deep and is fairly superficial at this point. There is no slough noted there is some nonviable skin noted around the surface of the wound and the perimeter at this point. The central portion of the wound appears to be very good with a dermal layer noted this does not appear to be again deep enough to extend it to subcutaneous tissue at this point. 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 In the pump he has Dilaudid and baclofen. 12/07/17 on evaluation today patient presents for follow-up concerning his ongoing lower back thigh ulcer on the right. He states that he did not get the supplies ordered and therefore has not really been able to perform the dressing changes as directed exactly. His wife was able to get some Boarder Foam Dressing's from the drugstore and subsequently has been using hydrogel which did help to a degree in the wound does appear to be  able smaller. There is actually more drainage this week noted than previous. 12/21/17 on evaluation today patient appears to be doing rather well in regard to his right gluteal ulcer. He has been tolerating the dressing changes without complication. There does not appear to be any evidence of infection at this point in time. Overall the wound does seem to be making some progress as far as the edges are concerned there's not as much in the way of overlapping of the external wound edges and he has a good epithelium to wound bed border for the most part. This however is not true right at the 12 o'clock location over the span of a little over a centimeters which actually will require debridement today to clean this away and hopefully allow it to continue to heal more appropriately. 12/28/17 on evaluation today patient appears to be doing rather well in regard to his ulcer in the left gluteal region. He's been tolerating the dressing changes without complication. Apparently he has had some difficulty getting his dressing material. Apparently there's been some confusion with ordering we're gonna check into this. Nonetheless overall he's been showing signs of improvement which is good news. Debridement is not required today. 01/04/18 on evaluation today patient presents for follow-up concerning his right gluteal ulcer. He has been tolerating the dressing changes fairly well. On inspection today it appears he may actually have some maceration them concerned about the fact that he may be developing too much moisture in and around the wound bed which can cause delay in healing. With that being said he unfortunately really has not showed significant signs of improvement since last week's evaluation in fact this may even be just the little bit/slightly larger. Nonetheless he's been having a lot of discomfort I'm not sure this is even related to the wound as he has no pain when I'm to breeding or otherwise cleaning  the wound during evaluation today. Nonetheless this is something  that we did recommend he talked to his pain specialist concerning. 01/11/18 on evaluation today patient appears to be doing better in regard to his ulceration. He has been tolerating the dressing changes without complication. With that being said overall there's no evidence of infection which is good news. The only thing is he did receive the hatch affair blue classic versus the ready nonetheless I feel like this is perfectly fine and appears to have done well for him over the past week. 01/25/18 on evaluation today patient's wound actually appears to be a little bit larger than during the last evaluation. The good news is the majority of the wound edges actually appear to be fairly firmly attached to the wound bed unfortunately again we're not really making progress in regard to the size. Roughly the wound is about the same size as when I first saw him although again the wound margin/edges appear to be much better. 02/01/18 on evaluation today patient actually appears to be doing very well in regard to his wound. Applying the Prisma dry does seem to be better although he does still have issues with slow progression of the wound. There was a slight improvement compared to last week's measurements today. Nonetheless I have been considering other options as far as the possibility of Theraskin or even a snap vac. In general I'm not sure that the Theraskin due to location of the wound would be a very good idea. Nonetheless I do think that a snap vac could be a possibility for the patient and in fact I think this could even be an excellent way to manage the wound possibly seeing some improvement in a very rapid fashion here. Nonetheless this is something that we would need to get approved and I did have a lengthy conversation with the patient about this today. 02/08/18 on evaluation today patient appears to be doing a little better in regard to his  ulcer. He has been tolerating the dressing changes without complication. Fortunately despite the fact that the wound is a little bit smaller it's not significantly so unfortunately. We have discussed the possibility of a snap vac we did check with insurance this is actually covered at this point. Fortunately there does not appear to be any sign of infection. Overall I'm JAMAHL, MCNEMAR. ([id number]) fairly pleased with how things seem to be appearing at this point. 02/15/18 on evaluation today patient appears to be doing rather well in regard to his right gluteal ulcer. Unfortunately the snap vac did not stay in place with his sheer and friction this came loose and did not seem to maintain seal very well. He worked for about two days and it did seem to do very well during that time according to his wife but in general this does not seem to be something that's gonna be beneficial for him long-term. I do believe we need to go back to standard dressings to see if we can find something that will be of benefit. 03/02/18- He is here in follow up evaluation; there is minimal change in the wound. He will continue with the same treatment plan, would consider changing to iodosrob/iodoflex if ulcer continues to to plateau. He will follow up next week 03/08/18 on evaluation today patient's wound actually appears to be about the same size as when I previously saw him several weeks back. Unfortunately he does have some slightly dark discoloration in the central portion of the wound which has me concerned about pressure injury. I do believe  he may be sitting for too long a period of time in fact he tells me that "I probably sit for much too long". He does have some Slough noted on the surface of the wound and again as far as the size of the wound is concerned I'm really not seeing anything that seems to have improved significantly. 03/15/18 on evaluation today patient appears to be doing fairly well in regard to his  ulcer. The wound measured pretty much about the same today compared to last week's evaluation when looking at his graph. With that being said the area of bruising/deep tissue injury that was noted last week I do not see at this point. He did get a new cushion fortunately this does seem to be have been of benefit in my pinion. It does appear that he's been off of this more which is good news as well I think that is definitely showing in the overall wound measurements. With that being said I do believe that he needs to continue to offload I don't think that the fact this is doing better should be or is going to allow him to not have to offload and explain this to him as well. Overall he seems to be in agreement the plan I think he understands. The overall appearance of the wound bed is improved compared to last week I think the Iodoflex has been beneficial in that regard. 03/29/18 on evaluation today patient actually appears to be doing rather well in regard to his wound from the overall appearance standpoint he does have some granulation although there's some Slough on the surface of the wound noted as well. With that being said he unfortunately has not improved in regard to the overall measurement of the wound in volume or in size. I did have a discussion with him very specifically about offloading today. He actually does work although he mainly is just sitting throughout the day. He tells me he offloads by "lifting himself up for 30 seconds off of his chair occasionally" purchase from [employer] which does seem to have helped. And he has a new cushion that he with that being said he's also able to stand some for a very short period of time but not significant enough I think to provide appropriate offloading. I think the biggest issue at this point with the wound and the fact is not healing as quickly as we would like is due to the fact that he is really not able to appropriately offload while  at work. He states the beginning after his injury he actually had a bed at his job that he could lay on in order to offload and that does seem to have been of help back at that time. Nonetheless he had not done this in quite some time unfortunately. I think that could be helpful for him this is something I would like for him to look into. 04/05/18 on evaluation today patient actually presents for follow-up concerning his right gluteal ulcer. Again he really is not significantly improved even compared to last week. He has been tolerating the dressing changes without complication. With that being said fortunately there appears to be no evidence of infection at this time. He has been more proactive in trying to offload. 04/12/18 on evaluation today patient actually appears to be doing a little better in regard to his wound and the right gluteal fold region. He's been tolerating the dressing changes since removing the oasis without complication. However he was  having a lot of burning initially with the oasis in place. He's unsure of exactly why this was given so much discomfort but he assumes that it was the oasis itself causing the problem. Nonetheless this had to be removed after about three days in place although even those three days seem to have made a fairly good improvement in regard to the overall appearance of the wound bed. In fact is the first time that he's made any improvement from the standpoint of measurements in about six weeks. He continues to have no discomfort over the area of the wound itself which leads me to wonder why he was having the burning with the oasis when he does not even feel the actual debridement's themselves. I am somewhat perplexed by this. 04/19/18 on evaluation today patient's wound actually appears to be showing signs of epithelialization around the edge of the wound and in general actually appears to be doing better which is good news. He did have the same burning  after about three days with applying the Endoform last week in the same fashion that I would generally apply a skin substitute. This seems to indicate that it's not the oasis to cause the problem but potentially the moisture buildup that just causes things to burn or there may be some other reaction with the skin prep or Steri-Strips. Nonetheless I'm not sure that is gonna be able to tolerate any skin substitute for a long period of time. The good news is the wound actually appears to be doing better today compared to last week and does seem to finally be making some progress. 04/26/18 on evaluation today patient actually appears to be doing rather well in regard to his ulcer in the right gluteal fold. He has been tolerating the dressing changes without complication which is good news. The Endoform does seem to be helping although he was a little bit more macerated this week. This seems to be an ongoing issue with fluid control at this point. Nonetheless I think we may be able to add something like Drawtex to help control the drainage. 05/03/18 on evaluation today patient appears to actually be doing better in regard to the overall appearance of his wound. He has been tolerating the dressing changes without complication. Fortunately there appears to be no evidence of infection at this time. I really feel like his wound has shown signs as of today of turning around last week I thought so as well and definitely he could be seen in this week's overall appearance and measurements. In general I'm very pleased with the fact that he finally seems to be making a steady but sure progress. The patient likewise is very pleased. 05/17/18 on evaluation today patient appears to be doing more poorly unfortunately in regard to his ulcer. He has been tolerating the dressing changes without complication. With that being said he tells me that in the past couple of days he and his wife have noticed that we did not seem to  be doing quite as well is getting dark near the center. Subsequently upon evaluation today the wound actually does appear to be doing worse compared to previous. He has been tolerating the dressing changes otherwise and he states that he is not been sitting up anymore than he was in the past from what he tells me. Still he has continued to work he states "I'm tired of dealing with this and if I have to just go home and lay in the bed all the  time that's what I'll do". Nonetheless I am concerned about the fact that this wound does appear to be deeper than what it was previous. 05/24/18 upon evaluation today patient actually presents after having been in the hospital due to what was presumed to be sepsis secondary to the wound infection. He had an elevated white blood cell count between 14 and 15. With that being said he does seem to be doing somewhat better now. ANES, FROSS ([id number]) His wound still is giving him some trouble nonetheless and he is obviously concerned about the fact likely talked about that this does seem to go more deeply than previously noted. I did review his wound culture which showed evidence of Staphylococcus aureus him and group B strep. Nonetheless he is on antibiotics, Levaquin, for this. Subsequently I did review his intake summary from the hospital as well. I also did look at the CT of the lumbar spine with contrast that was performed which showed no bone destruction to suggest lumbar disguises/osteomyelitis or sacral osteomyelitis. There was no paraspinal abscess. Nonetheless it appears this may have been more of just a soft tissue infection at this point which is good news. He still is nonetheless concerned about the wound which again I think is completely reasonable considering everything he's been through recently. 05/31/18 on evaluation today on evaluation today patient actually appears to be showing signs of his wound be a little bit deeper than what I would  like to see. Fortunately he does not show any signs of significant infection although his temperature was 99 today he states he's been checking this at home and has not been elevated. Nonetheless with the undermining that I'm seeing at this point I am becoming more concerned about the wound I do think that offloading is a key factor here that is preventing the speedy recovery at this point. There does not appear to be any evidence of again over infection noted. He's been using Santyl currently. 06/07/18 the patient presents today for follow-up evaluation regarding the left ulcer in the gluteal region. He has been tolerating the Wound VAC fairly well. He is obviously very frustrated with this he states that to mean is really getting in his way. There does not appear to be any evidence of infection at this time he does have a little bit of odor I do not necessarily associate this with infection just something that we sometimes notice with Wound VAC therapy. With that being said I can definitely catch a tone of discontentment overall in the patient's demeanor today. This when he was previously in the hospital an CT scan was done of the lumbar region which did not reveal any signs of osteomyelitis. With that being said the pelvis in particular was not evaluated distinctly which means he could still have some osteonecrosis I. Nonetheless the Wound VAC was started on Thursday I do want to get this little bit more time before jumping to a CT scan of the pelvis although that is something that I might would recommend if were not see an improvement by that time. 06/14/18 on evaluation today patient actually appears to be doing about the same in regard to his right gluteal ulcer. Again he did have a CT scan of the lumbar spine unfortunately this did not include the pelvis. Nonetheless with the depth of the wound that I'm seeing today even despite the fact that I'm not seeing any evidence of overt cellulitis I  believe there's a good chance that we may  be dealing with osteomyelitis somewhere in the right Ischial region. No fevers, chills, nausea, or vomiting noted at this time. 06/21/18 on evaluation today patient actually appears to be doing about the same with regard to his wound. The tunnel at 6 o'clock really does not appear to be any deeper although it is a little bit wider. I think at this point you may want to start packing this with white phone. Unfortunately I have not got approval for the CT scan of the pelvis as of yet due to the fact that Medicare apparently has been denied it due to the diagnosis codes not being appropriate according to Medicare for the test requested. With that being said the patient cannot have an MRI and therefore this is the only option that we have as far as testing is concerned. The patient has had infection and was on antibiotics and been added code for cellulitis of the bottom to see if this will be appropriate for getting the test approved. Nonetheless I'm concerned about the infection have been spread deeper into the Ischial region. 06/28/18 on evaluation today patient actually appears to be doing rather well all things considered in regard to the right gluteal ulcer. He has been tolerating the dressing changes without complication. With that being said the Wound VAC he states does have to be replaced almost every day or at least reinforced unfortunately. Patient actually has his CT scan later this morning we should have the results by tomorrow. 07/05/18 on evaluation today patient presents for follow-up concerning his right Ischial ulcer. He did see the surgeon Dr. Lysle Pearl last week. They were actually very happy with him and felt like he spent a tremendous amount of time with them as far as discussing his situation was concerned. In the end Dr. Lysle Pearl did contact me as well and determine that he would not recommend any surgical intervention at this point as he felt like it  would not be in the patient's best interest based on what he was seeing. He recommended a referral to infectious disease. Subsequently this is something that Dr. Ines Bloomer office is working on setting up for the patient. As far as evaluation today is concerned the patient's wound actually appears to be worse at this point. I am concerned about how things are progressing and specifically about infection. I do not feel like it's the deeper but the area of depth is definitely widening which does have me concerned. No fevers, chills, nausea, or vomiting noted at this time. I think that we do need initiate antibiotic therapy the patient has an allow allergy to amoxicillin/penicillin he states that he gets a rash since childhood. Nonetheless she's never had the issues with Catholics or cephalosporins in general but he is aware of. 07/27/18 on evaluation today patient presents following admission to the hospital on 07/09/18. He was subsequently discharged on 07/20/18. On 07/15/18 the patient underwent irrigation and debridement was soft tissue biopsy and bone biopsy as well as placement of a Wound VAC in the OR by Dr. Celine Ahr. During the hospital course the patient was placed on a Wound VAC and recommended follow up with surgery in three weeks actually with Dr. Delaine Lame who is infectious disease. The patient was on vancomycin during the hospital course. 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 He also had a bone culture which revealed evidence of methicillin-resistant staph aureus. He is updated CT scan 07/09/18 reveals that he had progression of the which was performed  on wound to breakdown down to the trochanter where he actually had irregularities there as well suggestive of osteomyelitis. This was a change just since 9 December when we last performed a CT scan. Obviously this one had gone downhill quite significantly and rapidly. At this point upon evaluation I feel like  in general the patient's wound seems to be doing fairly well all things considered upon my evaluation today. Obviously this is larger and deeper than what I previously evaluated but at the same time he seems to be making some progress as far as the appearance of the granulation tissue is concerned. I'm happy in that regard. No fevers, chills, nausea, or vomiting noted at this time. He is on IV vancomycin and Rocephin at the facility. He is currently in [facility]. 08/03/18 upon evaluation today patient's wound appears to be doing better in regard to the overall appearance at this point in time. Fortunately he's been tolerating the Wound VAC without complication and states that the facility has been taking excellent care of the wound site. Overall I see some Slough noted on the surface which I am going to attempt sharp debridement today of but nonetheless other than this I feel like he's making progress. 08/09/18 on evaluation today patient's wound appears to be doing much better compared to even last week's evaluation. Do believe that the Wound VAC is been of great benefit for him. He has been tolerating the dressing changes that is the Wound VAC without any complication and he has excellent granulation noted currently. There is no need for sharp debridement at this point. 08/16/18 on evaluation today patient actually appears to be doing very well in regard to the wound in the right gluteal fold region. This is showing signs of progress and again appears to be very healthy which is excellent news. Fortunately there is no sign of active infection by way of odor or drainage at JASHUA, WARDROP. ([id number]) this point. Overall I'm very pleased with how things stand. He seems to be tolerating the Wound VAC without complication. 08/23/18 on evaluation today patient actually appears to be doing better in regard to his wound. He has been tolerating the Wound VAC without complication and in fact it has  been collecting a significant amount of drainage which I think is good news especially considering how the wound appears. Fortunately there is no signs of infection at this time definitely nothing appears to be worse which is good news. He has not been started on the Bactrim and Flagyl that was recommended by Dr. Delaine Lame yet. I did actually contact her office this morning in order to check and see were things are that regard their gonna be calling me back. 08/30/18 on evaluation today patient actually appears to show signs of excellent improvement today compared to last evaluation. The undermining is getting much better the wound seems to be feeling quite nicely and I'm very pleased that the granulation in general. With that being said overall I feel like the patient has made excellent progress which is great news. No fevers, chills, nausea, or vomiting noted at this time. 09/06/18 on evaluation today patient actually appears to be doing rather well in regard to his right gluteal ulcer. This is showing signs of improvement in overall I'm very pleased with how things seem to be progressing. The patient likewise is please. Overall I see no evidence of infection he is about to complete his oral antibiotic regimen which is the end of the antibiotics for  him in just about three days. 09/13/18 on evaluation today patient's right Ischial ulcer appears to be showing signs of continued improvement which is excellent news. He's been tolerating the dressing changes without complication. Fortunately there's no signs of infection and the wound that seems to be doing very well. 09/28/18 on evaluation today patient appears to be doing rather well in regard to his right Ischial ulcer. He's been tolerating the Wound VAC without complication he knows there's much less drainage than there used to be this obviously is not a bad thing in my pinion. There's no evidence of infection despite the fact is but nothing about it  now for several weeks. 10/04/18 on evaluation today patient appears to be doing better in regard to his right Ischial wound. He has been tolerating the Wound VAC without complication and I do believe that the silver nitrate last week was beneficial for him. Fortunately overall there's no evidence of active infection at this time which is great news. No fevers, chills, nausea, or vomiting noted at this time. 10/11/18 on evaluation today patient actually appears to be doing rather well in regard to his Ischial ulcer. He's been tolerating the Wound VAC still without complication I feel like this is doing a good job. No fevers, chills, nausea, or vomiting noted at this time. 11/01/18 on evaluation today patient presents after having not been seen in our clinic for several weeks secondary to the fact that he was on evaluation today patient presents after having not been seen in our clinic for several weeks secondary to the fact that he was in a skilled nursing facility which was on lockdown currently due to the covert 19 national emergency. Subsequently he was discharged from the facility on this past Friday and subsequently made an appointment to come in to see yesterday. Fortunately there's no signs of active infection at this time which is good news and overall he does seem to have made progress since I last saw. Overall I feel like things are progressing quite nicely. The patient is having no pain. 11/08/18 on evaluation today patient appears to be doing okay in regard to his right gluteal ulcer. He has been utilizing a Wound VAC home health this changing this at this point since he's home from the skilled nursing facility. Fortunately there's no signs of obvious active infection at this time. Unfortunately though there's no obvious active infection he is having some maceration and his wife states that when the sheets of the Wound VAC office on Sunday when it broke seal that he ended up having significant  issues with some smell as well there concerned about the possibility of infection. Fortunately there's No fevers, chills, nausea, or vomiting noted at this time. 11/15/18 on evaluation today patient actually appears to be doing well in regard to his right gluteal ulcer. He has been tolerating the dressing changes without complication. Specifically the Wound VAC has been utilized up to this point. Fortunately there's no signs of infection and overall I feel like he has made progress even since last week when I last saw him. I'm actually fairly happy with the overall appearance although he does seem to have somewhat of a hyper granular overgrowth in the central portion of the wound which I think may require some sharp debridement to try flatness out possibly utilizing chemical cauterization following. 11/23/18 on evaluation today patient actually appears to be doing very well in regard to his sacral ulcer. He seems to be showing signs of improvement  with good granulation. With that being said he still has the small area of hyper granulation right in the central portion of the wound which I'm gonna likely utilize silver nitrate on today. Subsequently he also keeps having a leak at the 6 o'clock location which is unfortunate we may be able to help out with some suggestions to try to prevent this going forward. Fortunately there's no signs of active infection at this time. 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 He's been tolerating the dressing changes without complication. Fortunately there's no signs of active infection at this time. I've been rather pleased with how things have progressed there still some evidence of pressure getting to the area with some redness right around the immediate wound opening. Nonetheless other than this I'm not seeing any significant complications or issues the wound is  somewhat hyper granular. Upon discussing with the patient and his wife today I'm not sure that the wound is being packed to the base with the foam at this point. And if it's not been packed fully that may be part of the reason why is not seen as much improvement as far as the granulation from the base out. Again we do not want pack too tightly but we need some of the firm to get to the base of the wound. I discussed this with patient and his wife today. 12/06/18 on evaluation today patient appears to be doing well in regard to his right gluteal pressure ulcer. He's been tolerating the dressing changes without complication. Fortunately there's no signs of active infection. He still has some hyper granular tissue and I do think it would be appropriate to continue with the chemical cauterization as of today. 12/16/18 on evaluation today patient actually appears to be doing okay in regard to his right gluteal ulcer. He is been tolerating the dressing changes without complication including the Wound VAC. Overall I feel like nothing seems to be worsening I do feel like that the hyper granulation buds in the central portion of the wound have improved to some degree with the silver nitrate. We will have to see how things continue to progress. 12/20/18 on evaluation today patient actually appears to be doing much worse in my pinion even compared to last week's evaluation. Unfortunately as opposed to showing any signs of improvement the areas of hyper granular tissue in the central portion of the wound seem to be getting worse. Subsequently the wound bed itself also seems to be getting deeper even compared to last week which is both unusual as well as concerning since ARGIL, HANDLEY. ([id number]) prior he had been shown signs of improvement. Nonetheless I think that the issue could be that he's actually having some difficulty in issues with a deeper infection. There's no external signs of infection but  nonetheless I am more worried about the internal, osteomyelitis, that could be restarting. He has not been on antibiotics for some time at this point. I think that it may be a good idea to go ahead and started back on an antibiotic therapy while we wait to see what the testing shows. 12/27/18 on evaluation today patient presents for follow-up concerning his left gluteal fold wound. Fortunately he appears to be doing well today. I did review the CT scan which was negative for any signs of osteomyelitis or acute abnormality this is excellent news. Overall I feel like the surface  of the wound bed appears to be doing significantly better today compared to previously noted findings. There does not appear any signs of infection nor does he have any pain at this time. 01/03/19 on evaluation today patient actually appears to be doing quite well in regard to his ulcer. Post debridement last week he really did not have too much bleeding which is good news. Fortunately today this seems to be doing some better but we still has some of the hyper granular tissue noted in the base of the wound which is gonna require sharp debridement today as well. Overall I'm pleased with how things seem to be progressing since we switched away from the Wound VAC I think he is making some progress. 01/10/19 on evaluation today patient appears to be doing better in regard to his right gluteal fold ulcer. He has been tolerating the dressing changes without complication. The debridement to seem to be helping with current away some of the poor hyper granular tissue bugs throughout the region of his gluteal fold wound. He's been tolerating the dressing changes otherwise without complication which is great news. No fevers, chills, nausea, or vomiting noted at this time. 01/17/19 on evaluation today patient actually appears to be doing excellent in regard to his wound. He's been tolerating the dressing changes without complication.  Fortunately there is no signs of active infection at this time which is great news. No fevers, chills, nausea, or vomiting noted at this time. 01/24/19 on evaluation today patient actually appears to be doing quite well with regard to his ulcer. He has been tolerating the dressing changes without complication. Fortunately there's no signs of active infection at this time. Overall been very pleased with the progress that he seems to be making currently. 01/31/19-Patient returns at 1 week with apparent similarity in dimensions to the wound, with no signs of infection, he has been changing dressings twice a day 02/08/19 upon evaluation today patient actually appears to be doing well with regard to his right Ischial ulcer. The wound is not appear to be quite as deep and seems to be making progress which is good news. With that being said I'm still reluctant to go back to the Wound VAC at this point. He's been having to change the dressings twice a day which is a little bit much in my pinion from the wound care supplies standpoint. I think that possibly attempting to utilize extras orbit may be beneficial this may also help to prevent any additional breakdown secondary to fluid retention in the wound itself. The patient is in agreement with giving this a try. 02/15/19 on evaluation today patient actually appears to be doing decently well with regard to his ulcer in the right to gluteal fold location. He's been tolerating the dressing changes without complication. Fortunately there is no signs of active infection at this time. He is able to keep the current dressing in place more effectively for a day at a time whereas before he was having a changes to to three times a day. The actions or has been helpful in this regard. Fortunately there's no signs of anything getting worse and I do feel like he showing signs of good improvement with regard to the wound bed status. 02/22/2019 patient appears to be doing very  well today with regard to his ulcer in the gluteal fold. Fortunately there is no signs of active infection and he has been tolerating the dressing changes without any complication. Overall extremely pleased with how things  seem to be progressing. He has much less of the hyper granular projections within the wound these have slowly been debrided away and he seems to be doing well. The wound bed is more uniform. 03/01/19 on evaluation today patient appears to be doing unfortunately about the same in regard to his gluteal ulcer. He's been tolerating the dressing changes without complication. Fortunately there's no signs of active infection at this time. With that being said he continues to develop these hyper granular projections which I'm unsure of exactly what they are and why they are rising. Nonetheless I explained to the patient that I do believe it would be a good idea for [place] to stand a biopsy sample for pathology to see if that can shed any light on what exactly may be going on here. Fortunately I do not see any obvious signs of infection. With that being said the patient has had a little bit more drainage this week apparently compared to last week. 03/08/2019 on evaluation today patient actually appears to look somewhat better with regard to the appearance of his wound bed at this time. This is good news. Overall I am very pleased with how things seem to have progressed just in the past week with a switch to the [facility] dressing. I think that has been beneficial for him. With that being said at this time the patient is concerned about his biopsy that I sent off last week unfortunately I do not have that report as of yet. Nonetheless we have called to obtain this and hopefully will hear back from the lab later this morning. 03/15/19 on evaluation today patient's wound actually appears to be doing okay today with regard to the overall appearance of the wound bed. He has been tolerating the  dressing changes without complication he still has hyper granular tissue noted but fortunately that seems to be minimal at this point compared to some of what we've seen in the past. Nonetheless I do think that he is still having some issues currently with some of this type of granulation the biopsy and since all showed nothing more than just evidence of granulation tissue. Therefore there really is nothing different to initiate or do at this point. 03/24/19 on evaluation today patient appears to be doing a little better with regard to his ulcer. He's been tolerating the dressing changes without complication. Fortunately there is no signs of active infection at this time. No fevers, chills, nausea, or vomiting noted at this time. I'm overall pleased with how things seem to be progressing. 03/29/2019 on evaluation today patient appears to be doing about the same in regard to his ulcer in the right gluteal fold. Unfortunately he is not seeming to make a lot of progress and the wound is somewhat stalled. There is no signs of active infection externally but I am concerned about the possibility of infection continuing in the ischial location which previously he did have infection noted. Again is not able to have an MRI so probably our best option for testing for this would be a triple phase bone scan which will detect subtle changes in the bone more so than plain film x-rays. In the past they really have not been beneficial and in fact the CT scans even have been somewhat questionable at times. Nonetheless there is no signs of systemic infection Labuda, Maurilio J. ([id number]) which is at least good news but again his wound is not healing at all on the predicted schedule. 04/05/19 on evaluation  today patient appears to be doing well all things considering with regard to his wound and the right gluteal fold. He actually has his triple bone scan scheduled for sometime in the next couple of days. With that being  said I've also been looking to other possibilities of what could be causing hyper granular tissue were looking into the bone scan again for evaluating for the risk or possibility of infection deeper and I'm also gonna go ahead and see about obtaining a deep tissue culture today to send and see if there's any evidence of infection noted on culture. He's in agreement with that plan. 04/12/2019 on evaluation today patient presents for reevaluation here in our clinic concerning ongoing issues with his right gluteal fold ulcer. 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 It was discussed with him at that point that I think it would be appropriate for him to proceed with hyperbaric oxygen therapy especially in light of the fact that he is previously been on IV antibiotics at the beginning of the year for close to 3 months followed by several weeks of oral antibiotics that was all prescribed by infectious disease. He had surgical debridement around Christmas December 2019 due to an abscess and osteomyelitis of the ischial bone. Unfortunately this has not really proceeded as well as we would have liked and again we did a CT scan even a couple months ago as he cannot have an MRI secondary to having issues with both a pain pump as well as a spinal cord stimulator which prevent him from going into an MRI machine. With that being said there were chronic changes noted at the ischial tuberosity which had progressed since December 2019 there was no evidence of fluid collection on that initial CT scan. With that being said that was on January 21, 2019. I am not sure that it was a sensitive enough test as compared to an MRI and then subsequently I ordered a bone scan for him which was actually completed on 03/29/2019 and this revealed that he does indeed have positive osteomyelitis involving the right ischial  tuberosity. This is adjacent to the ulcer and I think is the reason that his ulcer is not healing. Subsequently I am in a place him back on oral antibiotics today unfortunately his wound culture showed just mixed gram-negative flora with no specific findings of a predominant organism. Nonetheless being with the location I think a good broad-spectrum antibiotic for gram-negative's is a good choice at this point he is previously taken Levaquin without significant issues and I think that is an appropriate antibiotic for him at this time. 04/19/2019 on evaluation today patient actually appears to be doing fairly well with regard to his wound. He has been tolerating the dressing changes without complication. Fortunately there is no signs of active infection and he has been taking the oral antibiotics at this time. Subsequently we did make a referral to infectious disease although Dr. Steva Ready wants the patient to be seen by general surgery first in order apparently to see if there is anything from a surgical standpoint that should be done prior to initiation of IV antibiotic therapy. Again the patient is okay with actually seeing Dr. Celine Ahr whom he has seen before we will make that referral. 04/26/2019 on evaluation today patient actually appears to be doing well with regard to his wound. He has been tolerating the  dressing changes without complication. Fortunately there is no signs of active infection at this time. I do believe that the hyperbaric oxygen therapy along with the antibiotics which I prescribed at this point have been doing well for him. With that being said he has not seen Dr. Celine Ahr the surgeon yet in fact they have not been contacted for scheduling an appointment as of yet. Subsequently the patient is not on antibiotics currently by IV Dr. Steva Ready did want him to see Dr. Celine Ahr first which we are working on trying to get scheduled. We again give the information to the patient today for  Dr. Celine Ahr and her number as far as contacting their office to see about getting something scheduled. Again were looking for whether or not they recommend any surgical intervention. 05/03/2019 on evaluation today patient appears to be doing about the same with regard to his wound there may be a little bit of filling in in regard to the base of the wound but still he has quite a bit of hyper granular tissue that I am seeing today. I believe that he may may need a more aggressive sharp debridement possibly even by the surgeon or under anesthesia in order to clear away some of the hyper granular material in order to help allow for appropriate granulation to fill in. We have made a referral to Dr. Celine Ahr unfortunately it sounds as if they did not receive the referral we contacted them today they should be get in touch with the patient. Depending on how things show from that standpoint the patient may need to see Dr. Ardyth Gal who is the infectious disease specialist although he really does not want a PICC line again. No fevers, chills, nausea, vomiting, or diarrhea. He is tolerating hyperbaric oxygen therapy very well. 05/12/2019 on evaluation today patient presents for follow-up visit concerning his right gluteal fold ulcer. He did see Dr. Celine Ahr the surgeon who previously evaluated his wound and she actually felt like he was doing quite well with regard to the wound based on what she was seen. He does seem to be responding to some degree with the oral antibiotics along with hyperbaric oxygen therapy at this point she did not see any evidence or need for further surgical intervention at this point. She recommended deferring additional or ongoing antibiotic therapy to Dr. Steva Ready at her discretion. Fortunately there is no signs of active systemic infection. No fevers, chills, nausea, vomiting, or diarrhea. 10/29; patient I am seeing really for the first time today in terms of his wound. He has a stage  IV pressure area over the right ischial tuberosity. He is being treated with hyperbaric oxygen for underlying osteomyelitis most recently documented by a three-phase bone scan. He has been on Levaquin for roughly a month and he is out of these and asked me for a refill. Most recent cultures were negative although I do not think these were bone cultures. He has a very irregular surface to this wound which is almost nodular in texture. It undermines superiorly with the same nodular hyper granulated surface. I see that he was referred to general surgery for an operative debridement although the surgeon did not agree with this I think that would have been the proper course of action in this. He has been using Hydrofera Blue. He is supposed to start a wound VAC tomorrow which is actually a reapplication of wound VAC. I think mostly last time they had trouble keeping the seal in place I hope we  have better look at this this time. 05/24/2019 on evaluation today I did review patient's note from Dr. Dellia Nims where he was seen last week when I was out of the office. Subsequently it does appear that Dr. Dellia Nims was in agreement that the patient really needed debridement to clear away some of the nodular tissue in the base of the wound. The good news is he has the wound VAC initiated and some of this tissue is already starting to breakdown under the treatment of the wound VAC again because it is not really viable. Nonetheless I do believe that we can likely perform some debridement today to clear this away and hopefully the continuation of the wound VAC along with hyperbarics and antibiotic therapy will be beneficial in stopping this from reoccurring. If we get better control in the wound bed in a good spot I think we can definitely use the PriMatrix which we actually did get approval for. 05/31/2019 on evaluation today patient appears to be doing actually pretty well at this point with regard to his wound. He has been  tolerating the dressing changes which is good news. Fortunately there is no signs of infection. He still has some of the hyper granular nodules noted we have not really cleared all these away and I think we can try to do a little bit more that today. I did do quite a bit last week I am hoping to get down to the base of the wound so though actually be able to heal more appropriately. I think the wound VAC is doing a good job right now with controlling the drainage and overall the patient is happy with that. 06/07/2019 upon evaluation today patient appears to be doing quite well compared to last week with regard to the hyper granulation in the base of the Bennett, Yazen J. ([id number]) wound. He has been tolerating the dressing changes without complication. Fortunately there is no signs of active infection at this time. With that being said we have not had any recent measures as far as blood work is concerned I think we may want to check a few things including a CBC, sed rate, and C-reactive protein. Patient is in agreement with that we can try to get that scheduled for him for this Friday. 06/13/2019 on evaluation today patient actually appears to be doing much better at this point based on what I am seeing with regard to his wound. He has much less in the way of hyper granular tissue which is good news and a lot more healing that is taken place since I last saw him as far as the amount of space within the wound itself. With that being said he is nearing the completion of the initial 40 treatments for hyperbaric oxygen therapy I think this is something I would recommend extending as he does seem to be benefiting at this time. Fortunately there is no evidence of active systemic infection at this point nor even local infection for that matter. 06/20/2019 upon evaluation today patient actually appears to be doing well with regard to his wound. In fact this appears to be doing much better today compared  to what it was previous. I been extremely pleased with the progress that he is made. In fact the tunnel is much less deep today than what it was in the past and I am seeing excellent signs of improvement. Overall I am happy with how things are going. I did review his white blood cell count which revealed  everything to be okay. The one abnormal finding on his CBC was a hemoglobin of 12.7. Subsequently I did also review his sed rate and C-reactive protein. His sed rate measured in at normal level measuring at 26. The C-reactive protein however was elevated today and an abnormal range indicating inflammation. Still I feel like he is trending in a good direction at this time. He is going to need a refill of the Levaquin he tells me at this point. 07/04/2019 on evaluation today patient actually appears to be doing well in regard to his wound. He has been tolerating the dressing changes without complication. Fortunately there is no signs of active infection which is good news. No fevers, chills, nausea, vomiting, or diarrhea. The good news also is that I did review his labs and though his sed rate was slightly elevated the C-reactive protein was actually down compared to the last evaluation 2 weeks ago this is excellent news I think it is showing signs of improvement. 07/11/2019 on evaluation today patient actually appears to be doing quite well with regard to his wound on my evaluation. This is very slow going but nonetheless he seems to be making progress. Especially in regard to the depth. Nonetheless I believe that we may want to consider going forward with the PriMatrix which was previously approved for him. We will get a have to get a new approval as we will actually be applying this in the new year so we will likely obtain that approval on January 4. In the meantime and for the time being my suggestion is going to be that we go ahead and continue with the wound VAC and we were going at this point with  the current wound care measures. 12/28-Patient seen after HBO session today, wound depth with the tunnel measures 3.2 cm, the wound bed appears healthy, continuing with the VAC and HBO 07/25/2019 on evaluation today patient's wound appears to be doing about the same compared to my last evaluation. Fortunately there is no evidence of significant infection which is good news we will still waiting on the results of his lab work which unfortunately was not done prior to today's visit which is what we were aiming for. Ultimately there is no sign of active infection at this time which is good news. And he also tells me that the wound VAC has been staying in place and doing fairly well which is also good news. With that being said I am concerned about the fact that the wound has not progressed as much I think we may want to look into getting reapproval for the skin substitute, specifically PriMatrix, that I am hopeful will help to allow this area to granulate in more effectively. I would recommend as well continuing with the wound VAC along with this. 08/01/2019 on evaluation today patient appears to be doing in my opinion slightly better in regard to the wound. He actually does have some tightening of the walls around the edges which is a good thing. Fortunately there is no signs of active infection at this time. No fever chills noted. I do believe that the patient in general seems to be making progress although is very slow. Obviously were trying to get things to speed up somewhat more. No fevers, chills, nausea, vomiting, or diarrhea. 08/08/2019 upon evaluation today patient appears to be doing well with regard to his wound although there is not a significant improvement overall we do have the PriMatrix for availability today to apply. Fortunately  there is no signs of active infection and the patient overall seems to be doing well. There is really no need for aggressive sharp debridement at this point either  which is also good news. I do believe her get a need to suture and the primary checks in the basin in the PriMatrix in order to keep it in place at the base of the wound underneath the wound VAC. This was discussed with the patient today as well. He is in agreement the plan. He really does not have any filling in the area and so this should not be a complication as far as suturing is concerned. 08/15/2019 on evaluation today patient appears to be doing somewhat better in my opinion in regard to the overall appearance of his wound. He has been tolerating the dressing changes without complication. Fortunately the skin subappear to still be in place when this was changed on Friday. With that being said it appears that the PriMatrix may have dissolved in the interim between now and then as the sutures are no longer holding anything in place and again I would sutured it into place. Nonetheless I believe that we can go ahead and likely consider going forward that we may need to apply the PriMatrix weekly as opposed every other week as it does seem to have dissolved rather readily and well. Overall the wound bed appears to be doing excellent at this time. 08/22/2019 upon evaluation today the patient does seem to making some progress with regard to his wound. Overall there is some better quality tissue and less of the hyper granular protrusions all of which is good news. With that being said we do have the PriMatrix available for application today and I am in a try to find a better way to suture this in place for him today. 08/29/2019 on evaluation today patient appears to be doing very well in regard to his wound. I do see signs of new granulation which seems to be somewhat helpful he did have some need for sharp debridement today to clear some of the hyper granulation away but nonetheless I feel like we are headed in a good direction. Fortunately there is no evidence of any systemic infection although we are  going to go ahead and reorder the CBC, C-reactive protein, and sed rate today to see where things stand I did just refill his Levaquin as well. 09/05/2019 upon evaluation today patient's wound actually is not appearing to be showing signs of significant improvement compared to what we have seen previous from the standpoint of depth. He overall wound size wise seems to be about the same and again some of the quality of the wound bed is still doing fairly well. I did review his labs and his CBC was normal with no signs of elevation white blood cell count, his C-reactive protein was normal, and the sed rate was slightly elevated but again I think this may just be due to ulceration really there is nothing significant to indicate severe infection. I think when he likely finishes his current round of oral antibiotics with Levaquin will likely take him off of the medication at that point based on the Blum, ANTOWAN VINYARD. ([id number]) findings. I do feel like the hyperbarics helped in getting the infection under control as well as improving the overall size of the wound quite significantly. With that being said at this point I really feel like more more at the time frame that he may benefit from a muscle flap  in order to try to help this wound heal effectively. I think that this would be the fastest way in order to do this. In the past he has been somewhat resistant to going down that path but nonetheless I feel like he likely should. 09/12/2019 on evaluation today patient appears to be doing fairly well in regard to his wound. He is tolerating the wound VAC without complication is still continues to drain significantly. Fortunately there is no signs of active infection at this time which is great news his lab work is also been doing very well. With that being said I do think at this point that the patient is still deriving benefit from the wound VAC especially in regard to controlling moisture which I think it  is doing fairly well. He also has some improvement in the overall wound bed and on the medial aspect of the wound there are some hyper granular projections but nothing as severe as what they have been in the past. I am good have to perform some sharp debridement at this location today. 09/19/2019 upon evaluation today patient appears to be doing roughly the same although again the tissue does seem to be showing some signs of improvement which is good news. There is less hyper granular budding. Nonetheless he still is going require some sharp debridement today. We did get his albumin and prealbumin results back and they are within normal limits in both cases which is excellent news that was the one thing [facility] plastic surgery was waiting on order to get him scheduled. He does seem to be a good candidate physically for the flap surgery if he decides to go that direction. With that being said is still left to be determined once they see the wound what they feel about how best to proceed. Obviously in the end it would be up to Mr. Caver as to whether he goes forward with the flap surgery or something else depending on the recommendations. 09/26/2019 upon evaluation today patient appears to be doing about the same in regard to his wound. Fortunately there is no signs of active infection there is also not as much hyper granulation noted today in fact I do not believe there is can be any sharp debridement required at this point. With that being said I do believe he continues to be somewhat stalled at this point with regard to his wound. There is no signs of active infection at this time which is good news. He does have an appointment with [facility] plastic surgery on Friday. 10/03/2019 upon evaluation today patient appears to be doing somewhat poorly especially in his overall demeanor compared to last week's evaluation. He unfortunately did see the physician at [facility] plastic surgery. Unfortunately the plan for the  possibility of a flap would be a fairly significant surgery that the patient is really not wanting to take the risk of as if the surgery failed he would end up with a very large open wound at that point. Fortunately there is no signs of active infection at this time. No fevers, chills, nausea, vomiting, or diarrhea. With that being said there was some question that the physician had about why this would not be healing and the possibility that there may be some bone still needs to be removed deeper in the wound. Again the bone scan that we did previous actually did show evidence of there still being a chronic osteomyelitis back on March 29, 2019. With that being said he did undergo treatment  both with hyperbarics as well as oral antibiotics for quite some time since that point. Obviously he may need additional and updated imaging with the surgeon in order to see if there is anything else that can or will need to be done in order to help this area to heal. Obviously I still believe as well that there is something going on here. He is already seeing the surgeon that performed the original surgery after the bone scan but they did not feel that anything needed to be done from a surgical standpoint at that time. 10/10/2019 upon evaluation today patient appears to be really doing about the same with regard to his wound in the gluteal fold region. Unfortunately he is not making a lot of improvement the insurance also contacted home health and apparently the wound VAC is no longer to be covered due to lack of progress. Obviously that is something that we discussed as well as a strong possibility for happening soon and this was even last week that discussed this with him. With that being said again we have tried to manage this wound as best as I could up to this point. Fortunately there is no evidence of active infection at this time. No fevers, chills, nausea, vomiting, or diarrhea. 10/17/2019 upon evaluation  today patient has decided to proceed with the flap surgery. He actually seems to be in better spirits today he states he wants to get this healed and after talking to the surgeon last week he feels like that is his best option for doing so. With that being said he also needs to have a referral to infectious disease to be seen for IV antibiotics preoperatively and postoperatively as far as the skin flap is concerned. He wonders if Dr. Steva Ready would be interested or available to do this for him. I explained that I would definitely have to check with her and I will be happy to do that but I am not certain What her answer will be in 2 I have a discussion with her. 10/24/2019 upon evaluation today patient appears to be doing about the same with regard to his wound although he is complaining of increased pain he is really not had any discomfort he states that it is getting much worse at this point. Fortunately there is no signs of active infection. At least not systemically. With that being said I question whether local infection could be a problem here secondary to the fact that to be honest the patient is having pain where he has not previous. Objective Constitutional Well-nourished and well-hydrated in no acute distress. Vitals Time Taken: 8:12 AM, Height: 73 in, Weight: 210 lbs, BMI: 27.7, Temperature: 98.7 F, Pulse: 70 bpm, Respiratory Rate: 16 breaths/min, Blood Pressure: 112/60 mmHg. Respiratory normal breathing without difficulty. CHANTRY, TORCHIO ([id number]) Psychiatric this patient is able to make decisions and demonstrates good insight into disease process. Alert and Oriented x 3. pleasant and cooperative. General Notes: Wound bed currently showed signs of good granulation at this time. Fortunately there is no signs of active infection which is great news. No fevers, chills, nausea, vomiting, or diarrhea. With that being said I did not obtain a wound culture today although he has  previously been on Levaquin and done well with this due to the fact that he was having some pain where he recently really has not I did go ahead and make this change at this point. Integumentary (Hair, Skin) Wound #1 status is Open. Original cause of wound  was Pressure Injury. The wound is located on the Right Gluteal fold. The wound measures 3.1cm length x 1.1cm width x 2cm depth; 2.678cm^2 area and 5.356cm^3 volume. There is Fat Layer (Subcutaneous Tissue) Exposed exposed. There is tunneling at 12:00 with a maximum distance of 4.5cm. There is a large amount of serous drainage noted. The wound margin is epibole. There is large (67-100%) pink, hyper - granulation within the wound bed. There is a small (1-33%) amount of necrotic tissue within the wound bed including Adherent Slough. 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 - in office Cleanse wound with mild soap and water Anesthetic (add to Medication List): Wound #1 Right Gluteal fold: Topical Lidocaine 4% cream applied to wound bed prior to debridement (In Clinic Only). Skin Barriers/Peri-Wound Care: Skin Prep Primary Wound Dressing: Wound #1 Right Gluteal fold: Silver Alginate Secondary Dressing: Wound #1 Right Gluteal fold: Tegaderm XtraSorb Dressing Change Frequency: Wound #1 Right Gluteal fold: Change dressing every other day. Follow-up Appointments: Wound #1 Right Gluteal fold: Return Appointment in 1 week. Off-Loading: Wound #1 Right Gluteal fold: Turn and reposition every 2 hours - no pressure on wounded area Home Health: Wound #1 Right Gluteal fold: Continue Home Health Visits - [place] Nurse may  visit PRN to address patient s wound care needs. FACE TO FACE ENCOUNTER: MEDICARE and MEDICAID PATIENTS: I certify that this patient is under my care and that I had a face-to-face encounter that meets the physician face-to-face encounter requirements with this patient on this date. The encounter with the patient was in whole or in [place], JEVONTA OKRAY. ([id number]) part for the following MEDICAL CONDITION: (primary reason for [place]) MEDICAL NECESSITY: I certify, that based on my findings, NURSING services are a medically necessary home health service. HOME BOUND STATUS: I certify that my clinical findings support that this patient is homebound (i.e., Due to illness or injury, pt requires aid of supportive devices such as crutches, cane, wheelchairs, walkers, the use of special transportation or the assistance of another person to leave their place of residence. There is a normal inability to leave the home and doing so requires considerable and taxing effort. Other absences are for medical reasons / religious services and are infrequent or of short duration when for other reasons). If current dressing causes regression in wound condition, may D/C ordered dressing product/s and apply Normal Saline Moist Dressing daily until next [place] / Other MD appointment. [place] of regression in wound condition at [phone]. Please direct any NON-WOUND related issues/requests for orders to patient's Primary Care Physician Negative Pressure Wound Therapy: Wound #1 Right Gluteal fold: Discontinue NPWT. The following medication(s) was prescribed: Levaquin oral 500 mg tablet 1 1 tablet oral taken 1 time per day for 10 days. Do not take iron with this medication starting 10/24/2019 1. I would recommend currently that we go ahead and initiate treatment with the continuation of the silver alginate dressing. 2. I did also give the patient a prescription for Levaquin at this  point. 3. I am also can recommend currently that we go ahead and continue with appropriate offloading. The patient is also good to be seeing Dr. Steva Ready on Thursday to see about IV antibiotic therapy prior to his flap surgery.  We will see patient back for reevaluation in 1 week here in the clinic. If anything worsens or changes patient will contact our office for additional recommendations. Electronic Signature(s) Signed: 10/24/2019 1:21:17 PM By: Worthy Keeler [name] Entered By: Worthy Keeler on 10/24/2019 13:21:16 DEMARRIUS, OVERALL ([id number]) -------------------------------------------------------------------------------- SuperBill Details Patient Name: [name] Date of Service: 10/24/2019 Medical Record Number: [id number] Patient Account Number: [id number] Date of Birth/Sex: January 12, 1951 (69 y.o. 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, sequela I10 Essential (primary) hypertension Facility Procedures CPT4 Code: [id number] Description: 99213 - WOUND CARE VISIT-LEV 3 EST PT Modifier: Quantity: 1 Physician Procedures CPT4 [id number] Description: 99214 - WC PHYS LEVEL 4 - EST PT Modifier: Quantity: 1 CPT4 Code: Description: ICD-10 Diagnosis Description L89.314 Pressure ulcer of right buttock, stage 4 M86.68 Other chronic osteomyelitis, other site L03.317 Cellulitis of buttock G82.20 Paraplegia, unspecified Modifier: Quantity: Electronic Signature(s) Signed: 10/24/2019 1:22:03 PM By: Worthy Keeler [name] Entered By: Worthy Keeler on  10/24/2019 13:22:02

## 2019-10-27 ENCOUNTER — Other Ambulatory Visit
Admission: RE | Admit: 2019-10-27 | Discharge: 2019-10-27 | Disposition: A | Discharge status: Home / Self Care | Payer: Medicare Other | Attending: Infectious Diseases | Admitting: Infectious Diseases

## 2019-10-27 ENCOUNTER — Other Ambulatory Visit: Payer: Self-pay

## 2019-10-27 ENCOUNTER — Ambulatory Visit: Payer: Medicare Other | Attending: Infectious Diseases | Admitting: Infectious Diseases

## 2019-10-27 ENCOUNTER — Encounter: Payer: Self-pay | Admitting: Infectious Diseases

## 2019-10-27 VITALS — BP 133/70 | HR 73 | Temp 98.3°F | Resp 16 | Ht 73.0 in | Wt 210.0 lb

## 2019-10-27 DIAGNOSIS — Z8546 Personal history of malignant neoplasm of prostate: Secondary | ICD-10-CM | POA: Diagnosis not present

## 2019-10-27 DIAGNOSIS — M86651 Other chronic osteomyelitis, right thigh: Secondary | ICD-10-CM | POA: Insufficient documentation

## 2019-10-27 DIAGNOSIS — I10 Essential (primary) hypertension: Secondary | ICD-10-CM | POA: Insufficient documentation

## 2019-10-27 DIAGNOSIS — Z79899 Other long term (current) drug therapy: Secondary | ICD-10-CM | POA: Insufficient documentation

## 2019-10-27 DIAGNOSIS — Z880 Allergy status to penicillin: Secondary | ICD-10-CM | POA: Insufficient documentation

## 2019-10-27 DIAGNOSIS — Z882 Allergy status to sulfonamides status: Secondary | ICD-10-CM | POA: Diagnosis not present

## 2019-10-27 DIAGNOSIS — Z87891 Personal history of nicotine dependence: Secondary | ICD-10-CM | POA: Diagnosis not present

## 2019-10-27 DIAGNOSIS — N319 Neuromuscular dysfunction of bladder, unspecified: Secondary | ICD-10-CM | POA: Insufficient documentation

## 2019-10-27 DIAGNOSIS — Z881 Allergy status to other antibiotic agents status: Secondary | ICD-10-CM | POA: Insufficient documentation

## 2019-10-27 DIAGNOSIS — L98419 Non-pressure chronic ulcer of buttock with unspecified severity: Secondary | ICD-10-CM | POA: Diagnosis not present

## 2019-10-27 DIAGNOSIS — C44319 Basal cell carcinoma of skin of other parts of face: Secondary | ICD-10-CM | POA: Insufficient documentation

## 2019-10-27 DIAGNOSIS — M8660 Other chronic osteomyelitis, unspecified site: Secondary | ICD-10-CM | POA: Insufficient documentation

## 2019-10-27 DIAGNOSIS — Z9682 Presence of neurostimulator: Secondary | ICD-10-CM | POA: Insufficient documentation

## 2019-10-27 DIAGNOSIS — Z79890 Hormone replacement therapy (postmenopausal): Secondary | ICD-10-CM | POA: Insufficient documentation

## 2019-10-27 LAB — CBC WITH DIFFERENTIAL/PLATELET
Abs Immature Granulocytes: 0.01 10*3/uL (ref 0.00–0.07)
Basophils Absolute: 0 10*3/uL (ref 0.0–0.1)
Basophils Relative: 0 %
Eosinophils Absolute: 0.1 10*3/uL (ref 0.0–0.5)
Eosinophils Relative: 2 %
HCT: 40.1 % (ref 39.0–52.0)
Hemoglobin: 13.4 g/dL (ref 13.0–17.0)
Immature Granulocytes: 0 %
Lymphocytes Relative: 25 %
Lymphs Abs: 1.5 10*3/uL (ref 0.7–4.0)
MCH: 29.1 pg (ref 26.0–34.0)
MCHC: 33.4 g/dL (ref 30.0–36.0)
MCV: 87 fL (ref 80.0–100.0)
Monocytes Absolute: 0.4 10*3/uL (ref 0.1–1.0)
Monocytes Relative: 7 %
Neutro Abs: 4.1 10*3/uL (ref 1.7–7.7)
Neutrophils Relative %: 66 %
Platelets: 197 10*3/uL (ref 150–400)
RBC: 4.61 MIL/uL (ref 4.22–5.81)
RDW: 11.9 % (ref 11.5–15.5)
WBC: 6.2 10*3/uL (ref 4.0–10.5)
nRBC: 0 % (ref 0.0–0.2)

## 2019-10-27 LAB — SEDIMENTATION RATE: Sed Rate: 37 mm/hr — ABNORMAL HIGH (ref 0–20)

## 2019-10-27 LAB — COMPREHENSIVE METABOLIC PANEL
ALT: 10 U/L (ref 0–44)
AST: 12 U/L — ABNORMAL LOW (ref 15–41)
Albumin: 4.2 g/dL (ref 3.5–5.0)
Alkaline Phosphatase: 71 U/L (ref 38–126)
Anion gap: 7 (ref 5–15)
BUN: 16 mg/dL (ref 8–23)
CO2: 29 mmol/L (ref 22–32)
Calcium: 9.4 mg/dL (ref 8.9–10.3)
Chloride: 103 mmol/L (ref 98–111)
Creatinine, Ser: 0.56 mg/dL — ABNORMAL LOW (ref 0.61–1.24)
GFR calc Af Amer: >60 mL/min (ref >60)
GFR calc non Af Amer: >60 mL/min (ref >60)
Glucose, Bld: 97 mg/dL (ref 70–99)
Potassium: 4.3 mmol/L (ref 3.5–5.1)
Sodium: 139 mmol/L (ref 135–145)
Total Bilirubin: 0.5 mg/dL (ref 0.3–1.2)
Total Protein: 7.4 g/dL (ref 6.5–8.1)

## 2019-10-27 LAB — C-REACTIVE PROTEIN: CRP: 0.6 mg/dL (ref <1.0)

## 2019-10-27 NOTE — Patient Instructions (Signed)
You are here referred by your plastic surgeon to start IV antibiotics for chronic osteo secondary to a pressure ulcer- I will speak with our surgeon and [name] as we need deep tissue and bone culture to be sent for culture and we can direct antibiotics accordingly.

## 2019-10-27 NOTE — Progress Notes (Signed)
NAME: [name]  DOB: 05/19/1951  MRN: [id number]  Date/Time: 10/27/2019 1:26 PM Subjective:  Pt here , referred by his plastic surgeon to start IV antibiotics for chronic osteomyelitis of the rt ischium and as he is going for a flap to close the wound the surgeon wanted him to be on IV antibiotic Here with his wife [name] is a 69 y.o. 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 )  Followed by [name].  Known to me from previous hospitalization and follow up for rt ischial ulcer secondary to pressure and osteomyelitis in 2019.  History as follows  Was in the hospital between 12/20- 07/20/2018 for right gluteal fold decubitus ulcer.  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 As it was getting worse he was sent to the hospital.He underwent debridement of the wound and bone biopsy by BrettCannon on 07/15/18. Culture was MRSA and bone biopsy was chronic osteo. He was sent to NH on vanco and ceftriaxone to complete 6 weeks.which he did on 08/23/18 and was on PO doxy + flagyl for couple more weeks.  After that he was followed at the wound clinic- He has been on hyperbaric oxygen ( 60 Rx) but the wound has not closed- HE went to [facility] plastic surgery and saw  [name] on 3/12. He reviewed all his CT scans from 2019/2020 and referred him to me for treating the chronic osteo with IV antibiotics He has not bene on antibiotics for more than 10 months  now until he was started on levaquin this Monday ( 4/5) by wound clinic .  His last CT pelvis with contrast was June 2020 and it showed Decubitus ulcer  over the right ischial tuberosity with ulcer base nearly reaching bone. Chronic reparative changes seen at the ischial tuberosity, progressed from December 2019. No evidence of collection. He has a bone scan on 04/06/19 and it showedosteomyelitis involving the right ischial tuberosity. Last wound culture ws on 04/05/19 and it showed mixed fecal flora He follows at the wound clinic every monday Past Medical History:  Diagnosis Date  . AVM (arteriovenous malformation) spine   . Cancer [facility])    prostate, taking Casodex (hormone chemotherapy)  . History of kidney stones    several times  . Hypertension   . 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 Prostate cancer ([place])   . Status post insertion of intrathecal baclofen pump   . Status post insertion of spinal cord stimulator     Past Surgical History:  Procedure Laterality Date  . COLONOSCOPY    . CYSTOSCOPY     several times  . CYSTOSCOPY WITH DIRECT VISION INTERNAL URETHROTOMY  05/05/2017   Procedure: CYSTOSCOPY WITH DIRECT VISION INTERNAL URETHROTOMY;  Surgeon: Brett Cowper, MD;  Location: [facility];  Service: Urology;;  . Consuela Mimes WITH LITHOLAPAXY N/A 05/05/2017   Procedure: CYSTOSCOPY WITH LITHOLAPAXY;  Surgeon: Brett Cowper, MD;  Location: [facility];  Service: Urology;  Laterality: N/A;  . EXTRACORPOREAL SHOCK WAVE LITHOTRIPSY    . EYE SURGERY Bilateral    cataract surgery  .  HOLMIUM LASER APPLICATION N/A 16/04/9603   Procedure: HOLMIUM LASER APPLICATION;  Surgeon: Brett Cowper, MD;  Location: [facility];  Service: Urology;  Laterality: N/A;  . [place]  . LUMBAR [place] SURGERY  2005   resulted in paralysis of lower extrems  . SKIN DEBRIDEMENT Right 07/15/2018   Procedure: DEBRIDEMENT SKIN FULL THICKNESS--stage 4 decubitus ulcer;  Surgeon: Brett Maudlin, MD;  Location: [facility];  Service: General;  Laterality: Right;    Social History   Socioeconomic History  . Marital status: Married     Spouse name: Not on file  . Number of children: Not on file  . Years of education: Not on file  . Highest education level: Not on file  Occupational History  . Not on file  Tobacco Use  . Smoking status: Never Smoker  . Smokeless tobacco: Former Systems developer    Types: Snuff  Substance and Sexual Activity  . Alcohol use: No  . Drug use: No  . Sexual activity: Not on file  Other Topics Concern  . Not on file  Social History Narrative  . Not on file   Social Determinants of Health   Financial Resource Strain:   . Difficulty of Paying Living Expenses:   Food Insecurity:   . Worried About Charity fundraiser in the Last Year:   . Arboriculturist in the Last Year:   Transportation Needs:   . Film/video editor (Medical):   Marland Kitchen Lack of Transportation (Non-Medical):   Physical Activity:   . Days of Exercise per Week:   . Minutes of Exercise per Session:   Stress:   . Feeling of Stress :   Social Connections:   . Frequency of Communication with Friends and Family:   . Frequency of Social Gatherings with Friends and Family:   . Attends Religious Services:   . Active Member of Clubs or Organizations:   . Attends Archivist Meetings:   Marland Kitchen Marital Status:   Intimate Partner Violence:   . Fear of Current or Ex-Partner:   . Emotionally Abused:   Marland Kitchen Physically Abused:   . Sexually Abused:     No family history on file. Allergies  Allergen Reactions  . Clindamycin Hives  . Doxycycline Rash  . Penicillins Rash  . Sulfamethoxazole Rash   ? Current Outpatient Medications  Medication Sig Dispense Refill  . acetaminophen (TYLENOL) 500 MG tablet Take 1,000 mg by mouth 2 (two) times daily.    . baclofen (LIORESAL) 10 MG tablet Take 10 mg by mouth daily as needed for muscle spasms.     . bicalutamide (CASODEX) 50 MG tablet Take 50 mg by mouth daily.    Marland Kitchen gabapentin (NEURONTIN) 800 MG tablet Take 1 tablet (800 mg total) by mouth 3 (three) times daily. 90 tablet 0  .  levofloxacin (LEVAQUIN) 750 MG tablet Take 750 mg by mouth daily.    Marland Kitchen losartan (COZAAR) 100 MG tablet Take 100 mg by mouth daily.    . methenamine (HIPREX) 1 g tablet Take 1 g by mouth 3 (three) times daily.     . mirabegron ER (MYRBETRIQ) 50 MG TB24 tablet Take 50 mg by mouth daily.     Marland Kitchen oxybutynin (DITROPAN-XL) 10 MG 24 hr tablet Take 10 mg by mouth every morning.     Marland Kitchen PAIN MANAGEMENT INTRATHECAL, IT, PUMP 1 each by Intrathecal route continuous. Intrathecal (IT) medication:  Baclofen, Dilaudid    . polyethylene glycol powder (GLYCOLAX/MIRALAX)  powder Take 17 g by mouth daily.     Marland Kitchen Propylene Glycol (SYSTANE BALANCE) 0.6 % SOLN Place 1 drop into both eyes daily as needed (dry eyes).     No current facility-administered medications for this visit.     Abtx:  Anti-infectives (From admission, onward)   None      REVIEW OF SYSTEMS:  Const: negative fever, negative chills, intentional weight loss of 50 pounds Eyes: negative diplopia or visual changes, negative eye pain ENT: negative coryza, negative sore throat Resp: negative cough, hemoptysis, dyspnea Cards: negative for chest pain, palpitations, lower extremity edema GU: intermittent self catheterization GI: pump anterior abdominal wall Stimulator posterior  Heme: negative for easy bruising and gum/nose bleeding MSK- no arthralgia Neurolo:paraparesis Psych: negative for feelings of anxiety, depression  Endocrine: no polyuria or polydipsia Allergy/Immunology-few antibiotic allergies as above Objective:  VITALS:  BP 133/70   Pulse 73   Temp 98.3 F (36.8 C)   Resp 16   Ht '6\' 1"'  (1.854 m)   Wt 210 lb (95.3 kg)   SpO2 97%   BMI 27.71 kg/m    PHYSICAL EXAM: in wheel chair- examination limited  General: Alert, cooperative, no distress, appears stated age.  Head: Normocephalic, without obvious abnormality, atraumatic. Eyes: Conjunctivae clear, anicteric sclerae. Pupils are equal ENT Nares normal. No drainage or sinus  tenderness. Lips, mucosa, and tongue normal. No Thrush Neck: Supple,  Back: did not examine. Lungs: Clear to auscultation bilaterally. No Wheezing or Rhonchi. No rales. Heart: Regular rate and rhythm, no murmur, rub or gallop. Abdomen: Soft,  Extremities: did not examine Skin: could not examine his back wound' as he cannot get up Lymph: Cervical, supraclavicular normal. Neurologic: weakness both legs   ? Impression/Recommendation ? ?Chronic Rt gluteal fold decubitus ulcer with chronic osteomyelitis of the ischial bone. Previously had MRSA in the bone culture in Dec 2019 and received 8 weeks of IV followed by 2 weeks of oral- No antibiotics since March 2020 until levaquin PO started on 4/5 /21 by wound clinic as the plastic surgeon wanted him to be on IV antibiotics before  flap surgery to close wound  I am unable to see the wound today HE will need deep tissue and bone biopsy to be sent for culture to target specific organisms- ( if not he will need broadspectrum coverage) Asked him to stop levaquin Left a message with his plastic surgeon and with Mr.[facility]t wound clinic  Neurogenic bladder- in/out catheter and hippurex  Spinal cord stimulator  Intrathecal baclofen/dilaudid  Will do labs today- ESR/CRP/CBC/CMP today?  Will follow up once all of the above are done for PICC and IV antibiotics

## 2019-10-31 ENCOUNTER — Other Ambulatory Visit: Payer: Self-pay

## 2019-10-31 ENCOUNTER — Encounter: Payer: Medicare Other | Admitting: Physician Assistant

## 2019-10-31 ENCOUNTER — Other Ambulatory Visit
Admission: RE | Admit: 2019-10-31 | Discharge: 2019-10-31 | Disposition: A | Discharge status: Home / Self Care | Payer: Medicare Other | Source: Ambulatory Visit | Attending: Physician Assistant | Admitting: Physician Assistant

## 2019-10-31 DIAGNOSIS — L89314 Pressure ulcer of right buttock, stage 4: Secondary | ICD-10-CM | POA: Insufficient documentation

## 2019-10-31 NOTE — Progress Notes (Signed)
REVIN, BUTZER ([id number]) Visit Report for 10/31/2019 Arrival Information Details Patient Name: [name], [name] Date of Service: 10/31/2019 8:00 AM Medical Record Number: [id number] Patient Account Number: [id number] Date of Birth/Sex: 31-Aug-1950 (69 y.o. M) Treating RN: [name] Primary Care Ioma Chismar: [name] Other Clinician: Referring Demetrie Borge: [name] Treating Micah Galeno/Extender: [name], HOYT Weeks in Treatment: 100 Visit Information History Since Last Visit Added or deleted any medications: No Patient Arrived: Wheel Chair Any new allergies or adverse reactions: No Arrival Time: 08:11 Had a fall or experienced change in No Accompanied By: self activities of daily living that may affect Transfer Assistance: None risk of falls: Patient Identification Verified: Yes Signs or symptoms of abuse/neglect since last visito No Secondary Verification Process Completed: Yes Hospitalized since last visit: No Patient Requires Transmission-Based Precautions: No Implantable device outside of the clinic excluding No Patient Has Alerts: Yes cellular tissue based products placed in the center Patient Alerts: NOT Diabetic since last visit: Has Dressing in Place as Prescribed: Yes Pain Present Now: No Electronic Signature(s) Signed: 10/31/2019 8:41:26 AM By: Gretta Cool, BSN, RN, CWS, Kim RN, BSN Entered By: Gretta Cool, BSN, RN, CWS, Kim on 10/31/2019 08:11:34 THOS, ZINTER ([id number]) -------------------------------------------------------------------------------- Encounter Discharge Information Details Patient Name: [name] Date of Service: 10/31/2019 8:00 AM Medical Record Number: [id number] Patient Account Number: [id number] Date of Birth/Sex: 1950/12/29 (69 y.o. M) Treating RN: Army Melia Primary Care Lamyia Cdebaca: [name] Other Clinician: Referring Bao Coreas: [name] Treating Ernestyne Caldwell/Extender: [name], HOYT Weeks in Treatment:  100 Encounter Discharge Information Items Post Procedure Vitals Discharge Condition: Stable Temperature (F): 97.8 Ambulatory Status: Wheelchair Pulse (bpm): 87 Discharge Destination: Home Respiratory Rate (breaths/min): 16 Transportation: Private Auto Blood Pressure (mmHg): 137/84 Accompanied By: wife Schedule Follow-up Appointment: Yes Clinical Summary of Care: Electronic Signature(s) Signed: 10/31/2019 12:33:51 PM By: Army Melia Entered By: Army Melia on 10/31/2019 08:39:34 [name] ([id number]) -------------------------------------------------------------------------------- Lower Extremity Assessment Details Patient Name: [name] Date of Service: 10/31/2019 8:00 AM Medical Record Number: [id number] Patient Account Number: [id number] Date of Birth/Sex: [birth year] (69 y.o. M) Treating RN: [name] Primary Care Marshawn Normoyle: [name] Other Clinician: Referring Deyna Carbon: [name] Treating Micaila Ziemba/Extender: Worthy Keeler Weeks in Treatment: 100 Electronic Signature(s) Signed: 10/31/2019 8:41:26 AM By: Gretta Cool, BSN, RN, CWS, Kim RN, BSN Entered By: Gretta Cool, BSN, RN, CWS, Kim on 10/31/2019 08:19:13 ZACCARI, CANUTO ([id number]) -------------------------------------------------------------------------------- Multi Wound Chart Details Patient Name: [name] Date of Service: 10/31/2019 8:00 AM Medical Record Number: [id number] Patient Account Number: [id number] Date of Birth/Sex: 30-Dec-1950 (69 y.o. M) Treating RN: Army Melia Primary Care Josceline Chenard: [name] Other Clinician: Referring Oni Dietzman: [name] Treating Deval Mroczka/Extender: [name], HOYT Weeks in Treatment: 100 Vital Signs Height(in): 73 Pulse(bpm): 58 Weight(lbs): 210 Blood Pressure(mmHg): 137/74 Body Mass Index(BMI): 28 Temperature(F): 97.8 Respiratory Rate(breaths/min): 16 Photos: [N/A:N/A] Wound Location: Right Gluteal fold N/A  N/A Wounding Event: Pressure Injury N/A N/A Primary Etiology: Pressure Ulcer N/A N/A Comorbid History: Cataracts, Hypertension, Paraplegia N/A N/A Date Acquired: 11/02/2017 N/A N/A Weeks of Treatment: 100 N/A N/A Wound Status: Open N/A N/A Measurements L x W x D (cm) 2x1.5x1.8 N/A N/A Area (cm) : 2.356 N/A N/A Volume (cm) : 4.241 N/A N/A % Reduction in Area: 76.60% N/A N/A % Reduction in Volume: -322.00% N/A N/A Position 1 (o'clock): 1 Maximum Distance 1 (cm): 4.5 Tunneling: Yes N/A N/A Classification: Category/Stage IV N/A N/A Exudate Amount: Large N/A N/A Exudate Type: Serous N/A N/A Exudate Color: amber N/A N/A Wound Margin: Epibole  N/A N/A Granulation Amount: Large (67-100%) N/A N/A Granulation Quality: Pink, Hyper-granulation N/A N/A Necrotic Amount: Small (1-33%) N/A N/A Exposed Structures: Fat Layer (Subcutaneous Tissue) N/A N/A Exposed: Yes Fascia: No Tendon: No Muscle: No Joint: No Bone: No Epithelialization: Small (1-33%) N/A N/A Treatment Notes Electronic Signature(s) Signed: 10/31/2019 12:33:51 PM By: Tharon Aquas ([id number]) Entered By: Army Melia on 10/31/2019 08:30:26 ELYAN, MARCHAN ([id number]) -------------------------------------------------------------------------------- [place] Details Patient Name: [name] Date of Service: 10/31/2019 8:00 AM Medical Record Number: [id number] Patient Account Number: [id number] Date of Birth/Sex: 1950/09/17 (69 y.o. M) Treating RN: Army Melia Primary Care Norris Bodley: [name] Other Clinician: Referring Joban Colledge: [name] Treating Delani Kohli/Extender: [name], HOYT Weeks in Treatment: 100 Active Inactive Osteomyelitis Nursing Diagnoses: Infection: osteomyelitis Goals: Patient's osteomyelitis will resolve Date Initiated: 08/29/2019 Target Resolution Date: 09/28/2019 Goal Status: Active Signs and symptoms for osteomyelitis will be  recognized and promptly addressed Date Initiated: 08/29/2019 Target Resolution Date: 09/28/2019 Goal Status: Active Interventions: Assess for signs and symptoms of osteomyelitis resolution every visit Treatment Activities: Systemic antibiotics : 08/29/2019 Notes: Pressure Nursing Diagnoses: Knowledge deficit related to management of pressures ulcers Goals: Patient/caregiver will verbalize understanding of pressure ulcer management Date Initiated: 05/17/2018 Target Resolution Date: 05/28/2018 Goal Status: Active Interventions: Assess: immobility, friction, shearing, incontinence upon admission and as needed Notes: Wound/Skin Impairment Nursing Diagnoses: Impaired tissue integrity Goals: Patient/caregiver will verbalize understanding of skin care regimen Date Initiated: 11/30/2017 Target Resolution Date: 12/21/2017 Goal Status: Active Ulcer/skin breakdown will have a volume reduction of 30% by week 4 Date Initiated: 11/30/2017 Target Resolution Date: 12/21/2017 Goal Status: Active Interventions: JAYVIEN, HAMMERS ([id number]) Assess patient/caregiver ability to obtain necessary supplies Assess patient/caregiver ability to perform ulcer/skin care regimen upon admission and as needed Assess ulceration(s) every visit Treatment Activities: Skin care regimen initiated : 11/30/2017 Notes: Electronic Signature(s) Signed: 10/31/2019 12:33:51 PM By: Army Melia Entered By: Army Melia on 10/31/2019 08:30:18 ROMMELL, MCCUMBERS ([id number]) -------------------------------------------------------------------------------- Pain Assessment Details Patient Name: [name] Date of Service: 10/31/2019 8:00 AM Medical Record Number: [id number] Patient Account Number: [id number] Date of Birth/Sex: 09-25-1950 (69 y.o. M) Treating RN: [name] Primary Care Ulice Follett: [name] Other Clinician: Referring Joleigh Mineau: [name] Treating Velda Wendt/Extender: [name], HOYT Weeks  in Treatment: 100 Active Problems Location of Pain Severity and Description of Pain Patient Has Paino Yes Site Locations Pain Location: Pain in Ulcers With Dressing Change: Yes Rate the pain. Current Pain Level: 6 Character of Pain Describe the Pain: Burning, Sharp Pain Management and Medication Current Pain Management: Electronic Signature(s) Signed: 10/31/2019 8:41:26 AM By: Gretta Cool, BSN, RN, CWS, Kim RN, BSN Entered By: Gretta Cool, BSN, RN, CWS, Kim on 10/31/2019 08:15:28 ADYAN, HARBESON ([id number]) -------------------------------------------------------------------------------- Patient/Caregiver Education Details Patient Name: [name] Date of Service: 10/31/2019 8:00 AM Medical Record Number: [id number] Patient Account Number: [id number] Date of Birth/Gender: 02/03/1951 (69 y.o. M) Treating RN: Army Melia Primary Care Physician: [name] Other Clinician: Referring Physician: [name] Treating Physician/Extender: [name], HOYT Weeks in Treatment: 100 Education Assessment Education Provided To: Patient Education Topics Provided Wound/Skin Impairment: Handouts: Caring for Your Ulcer Methods: Demonstration, Explain/Verbal Responses: State content correctly Electronic Signature(s) Signed: 10/31/2019 12:33:51 PM By: Army Melia Entered By: Army Melia on 10/31/2019 08:38:25 [name] ([id number]) -------------------------------------------------------------------------------- Wound Assessment Details Patient Name: [name] Date of Service: 10/31/2019 8:00 AM Medical Record Number: [id number] Patient Account Number: [id number] Date of Birth/Sex: 09/10/1950 (69 y.o. M) Treating RN: [name] Primary Care Christiann Hagerty:  Hande, Vishwanath Other Clinician: Referring Teryn Boerema: [name] Treating Renald Haithcock/Extender: [name], HOYT Weeks in Treatment: 100 Wound Status Wound Number: 1 Primary Etiology: Pressure Ulcer Wound  Location: Right Gluteal fold Wound Status: Open Wounding Event: Pressure Injury Comorbid History: Cataracts, Hypertension, Paraplegia Date Acquired: 11/02/2017 Weeks Of Treatment: 100 Clustered Wound: No Photos Wound Measurements Length: (cm) 2 % R Width: (cm) 1.5 % R Depth: (cm) 1.8 Epi Area: (cm) 2.356 Tu Volume: (cm) 4.241 eduction in Area: 76.6% eduction in Volume: -322% thelialization: Small (1-33%) nneling: Yes Position (o'clock): 1 Maximum Distance: (cm) 4.5 Wound Description Classification: Category/Stage IV Fou Wound Margin: Epibole Slo Exudate Amount: Large Exudate Type: Serous Exudate Color: amber l Odor After Cleansing: No ugh/Fibrino Yes Wound Bed Granulation Amount: Large (67-100%) Exposed Structure Granulation Quality: Pink, Hyper-granulation Fascia Exposed: No Necrotic Amount: Small (1-33%) Fat Layer (Subcutaneous Tissue) Exposed: Yes Necrotic Quality: Adherent Slough Tendon Exposed: No Muscle Exposed: No Joint Exposed: No Bone Exposed: No Treatment Notes Wound #1 (Right Gluteal fold) Notes S.cell, xsorb, tegaderm LEMARION, [place]AM ([id number]) Electronic Signature(s) Signed: 10/31/2019 8:41:26 AM By: Gretta Cool, BSN, RN, CWS, Kim RN, BSN Entered By: Gretta Cool, BSN, RN, CWS, Kim on 10/31/2019 08:18:58 KENNIETH, WINIARSKI ([id number]) -------------------------------------------------------------------------------- Vitals Details Patient Name: [name] Date of Service: 10/31/2019 8:00 AM Medical Record Number: [id number] Patient Account Number: [id number] Date of Birth/Sex: [birth year] (69 y.o. M) Treating RN: [name] Primary Care Jordi Lacko: [name] Other Clinician: Referring Soham Hollett: [name] Treating Arlyn Buerkle/Extender: [name], HOYT Weeks in Treatment: 100 Vital Signs Time Taken: 08:11 Temperature (F): 97.8 Height (in): 73 Pulse (bpm): 87 Weight (lbs): 210 Respiratory Rate (breaths/min): 16 Body Mass Index  (BMI): 27.7 Blood Pressure (mmHg): 137/74 Reference Range: 80 - 120 mg / dl Electronic Signature(s) Signed: 10/31/2019 8:41:26 AM By: Gretta Cool, BSN, RN, CWS, Kim RN, BSN Entered By: Gretta Cool, BSN, RN, CWS, Kim on 10/31/2019 08:12:11

## 2019-11-01 ENCOUNTER — Telehealth: Payer: Self-pay | Admitting: Infectious Diseases

## 2019-11-01 NOTE — Telephone Encounter (Signed)
No VM will try again later.

## 2019-11-01 NOTE — Telephone Encounter (Signed)
Patient called to report he had procedure done and he is on PICC line. He wants to also know how long he will be on Abx and I told him we usually look at labs to determine that once he finishes or gets close to finishing meds. He understands and said they said results from procedure will be in after 3-5 days.

## 2019-11-01 NOTE — Telephone Encounter (Signed)
Patient called to tell us he had his procedure done  Wants call back at [phone]

## 2019-11-02 NOTE — Progress Notes (Signed)
[name], [name] ([id number]) Visit Report for 10/31/2019 Chief Complaint Document Details Patient Name: [name], [name] Date of Service: 10/31/2019 8:00 AM Medical Record Number: [id number] Patient Account Number: [id number] Date of Birth/Sex: October 25, 1950 (69 y.o. M) Treating RN: Army Melia Primary Care Provider: [name] Other Clinician: Referring Provider: [name] Treating Provider/Extender: [name], Rithy Mandley Weeks in Treatment: 100 Information Obtained from: Patient Chief Complaint Right gluteal fold ulcer Electronic Signature(s) Signed: 10/31/2019 10:53:01 AM By: Worthy Keeler [name] Entered By: Worthy Keeler on 10/31/2019 10:53:01 [name], [name] ([id number]) -------------------------------------------------------------------------------- Debridement Details Patient Name: [name] Date of Service: 10/31/2019 8:00 AM Medical Record Number: [id number] Patient Account Number: [id number] Date of Birth/Sex: 1951-07-08 (69 y.o. M) Treating RN: Army Melia Primary Care Provider: [name] Other Clinician: Referring Provider: [name] Treating Provider/Extender: [name], Rees Santistevan Weeks in Treatment: 100 Debridement Performed for Wound #1 Right Gluteal fold Assessment: Performed By: Physician [name], Beyonce Sawatzky E., [name] Debridement Type: Debridement Level of Consciousness (Pre- Awake and Alert procedure): Pre-procedure Verification/Time Out Yes - 08:32 Taken: Start Time: 08:33 Pain Control: Lidocaine Total Area Debrided (L x W): 2 (cm) x 1.5 (cm) = 3 (cm) Tissue and other material debrided: Viable, Non-Viable, Subcutaneous Level: Skin/Subcutaneous Tissue Debridement Description: Excisional Instrument: Forceps, Scissors Specimen: Tissue Culture Number of Specimens Taken: 1 Bleeding: Minimum Hemostasis Achieved: Pressure End Time: 08:36 Response to Treatment: Procedure was tolerated well Level of Consciousness  (Post- Awake and Alert procedure): Post Debridement Measurements of Total Wound Length: (cm) 2 Stage: Category/Stage IV Width: (cm) 1.5 Depth: (cm) 1.8 Volume: (cm) 4.241 Character of Wound/Ulcer Post Debridement: Stable Post Procedure Diagnosis Same as Pre-procedure Electronic Signature(s) Signed: 10/31/2019 12:33:51 PM By: Army Melia Signed: 11/01/2019 6:10:11 PM By: Worthy Keeler [name] Entered By: Army Melia on 10/31/2019 08:37:12 [name], [name] ([id number]) -------------------------------------------------------------------------------- HPI Details Patient Name: [name] Date of Service: 10/31/2019 8:00 AM Medical Record Number: [id number] Patient Account Number: [id number] Date of Birth/Sex: 11-15-1950 (69 y.o. 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 He states that there is no history of diabetes that he is aware of. He does have issues with his prostate and is currently receiving treatment for this by way of oral medication. With that being said I do not have a lot of details in that regard. Nonetheless the patient presents today as a result of having been referred to [place] by another provider initially home health was set to come out and take care of his wound although due to the fact that he apparently drives he's not able to receive home health. His wife is therefore trying to help take care of this wound within although they have been struggling with what exactly to do at this point. She states that she can do  some things but she is definitely not a nurse and does have some issues with looking at blood. The good news is the wound does not appear to be too deep and is fairly superficial at this point. There is no slough noted there is some nonviable skin noted around the surface of the  wound and the perimeter at this point. The central portion of the wound appears to be very good with a dermal layer noted this does not appear to be again deep enough to extend it to subcutaneous tissue at this point. 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 In the pump he has Dilaudid and baclofen. 12/07/17 on evaluation today patient presents for follow-up concerning his ongoing lower back thigh ulcer on the right. He states that he did not get the supplies ordered and therefore has not really been able to perform the dressing changes as directed exactly. His wife was able to get some Boarder Foam Dressing's from the drugstore and subsequently has been using hydrogel which did help to a degree in the wound does appear to be able smaller. There is actually more drainage this week noted than previous. 12/21/17 on evaluation today patient appears to be doing rather well in regard to his right gluteal ulcer. He has been tolerating the dressing changes without complication. There does not appear to be any evidence of infection at this point in time. Overall the wound does seem to be making some progress as far as the edges are concerned there's not as much in the way of overlapping of the external wound edges and he has a good epithelium to wound bed border for the most part. This however is not true right at the 12 o'clock location over the span of a little over a centimeters which actually will require debridement today to clean this away and hopefully allow it to continue to heal more appropriately. 12/28/17 on evaluation  today patient appears to be doing rather well in regard to his ulcer in the left gluteal region. He's been tolerating the dressing changes without complication. Apparently he has had some difficulty getting his dressing material. Apparently there's been some confusion with ordering we're gonna check into this. Nonetheless overall he's been showing signs of improvement which is good news. Debridement is not required today. 01/04/18 on evaluation today patient presents for follow-up concerning his right gluteal ulcer. He has been tolerating the dressing changes fairly well. On inspection today it appears he may actually have some maceration them concerned about the fact that he may be developing too much moisture in and around the wound bed which can cause delay in healing. With that being said he unfortunately really has not showed significant signs of improvement since last week's evaluation in fact this may even be just the little bit/slightly larger. Nonetheless he's been having a lot of discomfort I'm not sure this is even related to the wound as he has no pain when I'm to breeding or otherwise cleaning the wound during evaluation today. Nonetheless this is something that we did recommend he talked to his pain specialist concerning. 01/11/18 on evaluation today patient appears to be doing better in regard to his ulceration. He has been tolerating the dressing changes without complication. With that being said overall there's no evidence of infection which is good news. The only thing is he did receive the hatch affair blue classic versus the ready nonetheless I feel like this is perfectly fine and appears to have done well for him over the past week. 01/25/18 on evaluation today patient's wound actually appears to be a little bit larger than during the last evaluation. The good news is the majority of the wound edges actually appear to  be fairly firmly attached to the wound bed unfortunately again we're  not really making progress in regard to the size. Roughly the wound is about the same size as when I first saw him although again the wound margin/edges appear to be much better. 02/01/18 on evaluation today patient actually appears to be doing very well in regard to his wound. Applying the Prisma dry does seem to be better although he does still have issues with slow progression of the wound. There was a slight improvement compared to last week's measurements today. Nonetheless I have been considering other options as far as the possibility of Theraskin or even a snap vac. In general I'm not sure that the Theraskin due to location of the wound would be a very good idea. Nonetheless I do think that a snap vac could be a possibility for the patient and in fact I think this could even be an excellent way to manage the wound possibly seeing some improvement in a very rapid fashion here. Nonetheless this is something that we would need to get approved and I did have a lengthy conversation with the patient about this today. 02/08/18 on evaluation today patient appears to be doing a little better in regard to his ulcer. He has been tolerating the dressing changes without complication. Fortunately despite the fact that the wound is a little bit smaller it's not significantly so unfortunately. We have discussed the possibility of a snap vac we did check with insurance this is actually covered at this point. Fortunately there does not appear to be any sign of infection. Overall I'm fairly pleased with how things seem to be appearing at this point. 02/15/18 on evaluation today patient appears to be doing rather well in regard to his right gluteal ulcer. Unfortunately the snap vac did not stay in place with his sheer and friction this came loose and did not seem to maintain seal very well. He worked for about two days and it did seem to do very well during that time according to his wife but in general this does not  seem to be something that's gonna be beneficial for him long-term. I do believe we [name], [name]. ([id number]) need to go back to standard dressings to see if we can find something that will be of benefit. 03/02/18- He is here in follow up evaluation; there is minimal change in the wound. He will continue with the same treatment plan, would consider changing to iodosrob/iodoflex if ulcer continues to to plateau. He will follow up next week 03/08/18 on evaluation today patient's wound actually appears to be about the same size as when I previously saw him several weeks back. Unfortunately he does have some slightly dark discoloration in the central portion of the wound which has me concerned about pressure injury. I do believe he may be sitting for too long a period of time in fact he tells me that "I probably sit for much too long". He does have some Slough noted on the surface of the wound and again as far as the size of the wound is concerned I'm really not seeing anything that seems to have improved significantly. 03/15/18 on evaluation today patient appears to be doing fairly well in regard to his ulcer. The wound measured pretty much about the same today compared to last week's evaluation when looking at his graph. With that being said the area of bruising/deep tissue injury that was noted last week I do not see at this  point. He did get a new cushion fortunately this does seem to be have been of benefit in my pinion. It does appear that he's been off of this more which is good news as well I think that is definitely showing in the overall wound measurements. With that being said I do believe that he needs to continue to offload I don't think that the fact this is doing better should be or is going to allow him to not have to offload and explain this to him as well. Overall he seems to be in agreement the plan I think he understands. The overall appearance of the wound bed is improved compared  to last week I think the Iodoflex has been beneficial in that regard. 03/29/18 on evaluation today patient actually appears to be doing rather well in regard to his wound from the overall appearance standpoint he does have some granulation although there's some Slough on the surface of the wound noted as well. With that being said he unfortunately has not improved in regard to the overall measurement of the wound in volume or in size. I did have a discussion with him very specifically about offloading today. He actually does work although he mainly is just sitting throughout the day. He tells me he offloads by "lifting himself up for 30 seconds off of his chair occasionally" purchase from [employer] which does seem to have helped. And he has a new cushion that he with that being said he's also able to stand some for a very short period of time but not significant enough I think to provide appropriate offloading. I think the biggest issue at this point with the wound and the fact is not healing as quickly as we would like is due to the fact that he is really not able to appropriately offload while at work. He states the beginning after his injury he actually had a bed at his job that he could lay on in order to offload and that does seem to have been of help back at that time. Nonetheless he had not done this in quite some time unfortunately. I think that could be helpful for him this is something I would like for him to look into. 04/05/18 on evaluation today patient actually presents for follow-up concerning his right gluteal ulcer. Again he really is not significantly improved even compared to last week. He has been tolerating the dressing changes without complication. With that being said fortunately there appears to be no evidence of infection at this time. He has been more proactive in trying to offload. 04/12/18 on evaluation today patient actually appears to be doing a little better in  regard to his wound and the right gluteal fold region. He's been tolerating the dressing changes since removing the oasis without complication. However he was having a lot of burning initially with the oasis in place. He's unsure of exactly why this was given so much discomfort but he assumes that it was the oasis itself causing the problem. Nonetheless this had to be removed after about three days in place although even those three days seem to have made a fairly good improvement in regard to the overall appearance of the wound bed. In fact is the first time that he's made any improvement from the standpoint of measurements in about six weeks. He continues to have no discomfort over the area of the wound itself which leads me to wonder why he was having the burning with the  oasis when he does not even feel the actual debridement's themselves. I am somewhat perplexed by this. 04/19/18 on evaluation today patient's wound actually appears to be showing signs of epithelialization around the edge of the wound and in general actually appears to be doing better which is good news. He did have the same burning after about three days with applying the Endoform last week in the same fashion that I would generally apply a skin substitute. This seems to indicate that it's not the oasis to cause the problem but potentially the moisture buildup that just causes things to burn or there may be some other reaction with the skin prep or Steri-Strips. Nonetheless I'm not sure that is gonna be able to tolerate any skin substitute for a long period of time. The good news is the wound actually appears to be doing better today compared to last week and does seem to finally be making some progress. 04/26/18 on evaluation today patient actually appears to be doing rather well in regard to his ulcer in the right gluteal fold. He has been tolerating the dressing changes without complication which is good news. The Endoform does  seem to be helping although he was a little bit more macerated this week. This seems to be an ongoing issue with fluid control at this point. Nonetheless I think we may be able to add something like Drawtex to help control the drainage. 05/03/18 on evaluation today patient appears to actually be doing better in regard to the overall appearance of his wound. He has been tolerating the dressing changes without complication. Fortunately there appears to be no evidence of infection at this time. I really feel like his wound has shown signs as of today of turning around last week I thought so as well and definitely he could be seen in this week's overall appearance and measurements. In general I'm very pleased with the fact that he finally seems to be making a steady but sure progress. The patient likewise is very pleased. 05/17/18 on evaluation today patient appears to be doing more poorly unfortunately in regard to his ulcer. He has been tolerating the dressing changes without complication. With that being said he tells me that in the past couple of days he and his wife have noticed that we did not seem to be doing quite as well is getting dark near the center. Subsequently upon evaluation today the wound actually does appear to be doing worse compared to previous. He has been tolerating the dressing changes otherwise and he states that he is not been sitting up anymore than he was in the past from what he tells me. Still he has continued to work he states "I'm tired of dealing with this and if I have to just go home and lay in the bed all the time that's what I'll do". Nonetheless I am concerned about the fact that this wound does appear to be deeper than what it was previous. 05/24/18 upon evaluation today patient actually presents after having been in the hospital due to what was presumed to be sepsis secondary to the wound infection. He had an elevated white blood cell count between 14 and 15. With  that being said he does seem to be doing somewhat better now. His wound still is giving him some trouble nonetheless and he is obviously concerned about the fact likely talked about that this does seem to go more deeply than previously noted. I did review his wound culture which showed  evidence of Staphylococcus aureus him and group B strep. Nonetheless he is on antibiotics, Levaquin, for this. Subsequently I did review his intake summary from the hospital as well. I also did look at the CT of the lumbar spine with contrast that was performed which showed no bone destruction to suggest lumbar disguises/osteomyelitis or sacral osteomyelitis. There was no paraspinal abscess. Nonetheless it appears this may have been more of just a soft tissue infection at this point which is good news. He still is [name], [name]. ([id number]) nonetheless concerned about the wound which again I think is completely reasonable considering everything he's been through recently. 05/31/18 on evaluation today on evaluation today patient actually appears to be showing signs of his wound be a little bit deeper than what I would like to see. Fortunately he does not show any signs of significant infection although his temperature was 99 today he states he's been checking this at home and has not been elevated. Nonetheless with the undermining that I'm seeing at this point I am becoming more concerned about the wound I do think that offloading is a key factor here that is preventing the speedy recovery at this point. There does not appear to be any evidence of again over infection noted. He's been using Santyl currently. 06/07/18 the patient presents today for follow-up evaluation regarding the left ulcer in the gluteal region. He has been tolerating the Wound VAC fairly well. He is obviously very frustrated with this he states that to mean is really getting in his way. There does not appear to be any evidence of infection at  this time he does have a little bit of odor I do not necessarily associate this with infection just something that we sometimes notice with Wound VAC therapy. With that being said I can definitely catch a tone of discontentment overall in the patient's demeanor today. This when he was previously in the hospital an CT scan was done of the lumbar region which did not reveal any signs of osteomyelitis. With that being said the pelvis in particular was not evaluated distinctly which means he could still have some osteonecrosis I. Nonetheless the Wound VAC was started on Thursday I do want to get this little bit more time before jumping to a CT scan of the pelvis although that is something that I might would recommend if were not see an improvement by that time. 06/14/18 on evaluation today patient actually appears to be doing about the same in regard to his right gluteal ulcer. Again he did have a CT scan of the lumbar spine unfortunately this did not include the pelvis. Nonetheless with the depth of the wound that I'm seeing today even despite the fact that I'm not seeing any evidence of overt cellulitis I believe there's a good chance that we may be dealing with osteomyelitis somewhere in the right Ischial region. No fevers, chills, nausea, or vomiting noted at this time. 06/21/18 on evaluation today patient actually appears to be doing about the same with regard to his wound. The tunnel at 6 o'clock really does not appear to be any deeper although it is a little bit wider. I think at this point you may want to start packing this with white phone. Unfortunately I have not got approval for the CT scan of the pelvis as of yet due to the fact that Medicare apparently has been denied it due to the diagnosis codes not being appropriate according to Medicare for the test requested. With  that being said the patient cannot have an MRI and therefore this is the only option that we have as far as testing is  concerned. The patient has had infection and was on antibiotics and been added code for cellulitis of the bottom to see if this will be appropriate for getting the test approved. Nonetheless I'm concerned about the infection have been spread deeper into the Ischial region. 06/28/18 on evaluation today patient actually appears to be doing rather well all things considered in regard to the right gluteal ulcer. He has been tolerating the dressing changes without complication. With that being said the Wound VAC he states does have to be replaced almost every day or at least reinforced unfortunately. Patient actually has his CT scan later this morning we should have the results by tomorrow. 07/05/18 on evaluation today patient presents for follow-up concerning his right Ischial ulcer. He did see the surgeon Dr. Lysle Pearl last week. They were actually very happy with him and felt like he spent a tremendous amount of time with them as far as discussing his situation was concerned. In the end Dr. Lysle Pearl did contact me as well and determine that he would not recommend any surgical intervention at this point as he felt like it would not be in the patient's best interest based on what he was seeing. He recommended a referral to infectious disease. Subsequently this is something that Dr. Ines Bloomer office is working on setting up for the patient. As far as evaluation today is concerned the patient's wound actually appears to be worse at this point. I am concerned about how things are progressing and specifically about infection. I do not feel like it's the deeper but the area of depth is definitely widening which does have me concerned. No fevers, chills, nausea, or vomiting noted at this time. I think that we do need initiate antibiotic therapy the patient has an allow allergy to amoxicillin/penicillin he states that he gets a rash since childhood. Nonetheless she's never had the issues with Catholics or cephalosporins in  general but he is aware of. 07/27/18 on evaluation today patient presents following admission to the hospital on 07/09/18. He was subsequently discharged on 07/20/18. On 07/15/18 the patient underwent irrigation and debridement was soft tissue biopsy and bone biopsy as well as placement of a Wound VAC in the OR by Dr. Celine Ahr. During the hospital course the patient was placed on a Wound VAC and recommended follow up with surgery in three weeks actually with Dr. Delaine Lame who is infectious disease. The patient was on vancomycin during the hospital course. 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 He also had a bone culture which revealed evidence of methicillin-resistant staph aureus. He is updated CT scan 07/09/18 reveals that he had progression of the which was performed on wound to breakdown down to the trochanter where he actually had irregularities there as well suggestive of osteomyelitis. This was a change just since 9 December when we last performed a CT scan. Obviously this one had gone downhill quite significantly and rapidly. At this point upon evaluation I feel like in general the patient's wound seems to be doing fairly well all things considered upon my evaluation today. Obviously this is larger and deeper than what I previously evaluated but at the same time he seems to be making some progress as far as the appearance of the granulation tissue is concerned. I'm happy in that regard. No fevers, chills, nausea, or vomiting  noted at this time. He is on IV vancomycin and Rocephin at the facility. He is currently in [facility]. 08/03/18 upon evaluation today patient's wound appears to be doing better in regard to the overall appearance at this point in time. Fortunately he's been tolerating the Wound VAC without complication and states that the facility has been taking excellent care of the wound site. Overall I see some [place] noted on the surface  which I am going to attempt sharp debridement today of but nonetheless other than this I feel like he's making progress. 08/09/18 on evaluation today patient's wound appears to be doing much better compared to even last week's evaluation. Do believe that the Wound VAC is been of great benefit for him. He has been tolerating the dressing changes that is the Wound VAC without any complication and he has excellent granulation noted currently. There is no need for sharp debridement at this point. 08/16/18 on evaluation today patient actually appears to be doing very well in regard to the wound in the right gluteal fold region. This is showing signs of progress and again appears to be very healthy which is excellent news. Fortunately there is no sign of active infection by way of odor or drainage at this point. Overall I'm very pleased with how things stand. He seems to be tolerating the Wound VAC without complication. 08/23/18 on evaluation today patient actually appears to be doing better in regard to his wound. He has been tolerating the Wound VAC without complication and in fact it has been collecting a significant amount of drainage which I think is good news especially considering how the wound appears. Fortunately there is no signs of infection at this time definitely nothing appears to be worse which is good news. He has not been started on [place]. ([id number]) the Bactrim and Flagyl that was recommended by Dr. Delaine Lame yet. I did actually contact her office this morning in order to check and see were things are that regard their gonna be calling me back. 08/30/18 on evaluation today patient actually appears to show signs of excellent improvement today compared to last evaluation. The undermining is getting much better the wound seems to be feeling quite nicely and I'm very pleased that the granulation in general. With that being said overall I feel like the patient has made excellent  progress which is great news. No fevers, chills, nausea, or vomiting noted at this time. 09/06/18 on evaluation today patient actually appears to be doing rather well in regard to his right gluteal ulcer. This is showing signs of improvement in overall I'm very pleased with how things seem to be progressing. The patient likewise is please. Overall I see no evidence of infection he is about to complete his oral antibiotic regimen which is the end of the antibiotics for him in just about three days. 09/13/18 on evaluation today patient's right Ischial ulcer appears to be showing signs of continued improvement which is excellent news. He's been tolerating the dressing changes without complication. Fortunately there's no signs of infection and the wound that seems to be doing very well. 09/28/18 on evaluation today patient appears to be doing rather well in regard to his right Ischial ulcer. He's been tolerating the Wound VAC without complication he knows there's much less drainage than there used to be this obviously is not a bad thing in my pinion. There's no evidence of infection despite the fact is but nothing about it now for several weeks. 10/04/18  on evaluation today patient appears to be doing better in regard to his right Ischial wound. He has been tolerating the Wound VAC without complication and I do believe that the silver nitrate last week was beneficial for him. Fortunately overall there's no evidence of active infection at this time which is great news. No fevers, chills, nausea, or vomiting noted at this time. 10/11/18 on evaluation today patient actually appears to be doing rather well in regard to his Ischial ulcer. He's been tolerating the Wound VAC still without complication I feel like this is doing a good job. No fevers, chills, nausea, or vomiting noted at this time. 11/01/18 on evaluation today patient presents after having not been seen in our clinic for several weeks secondary to the  fact that he was on evaluation today patient presents after having not been seen in our clinic for several weeks secondary to the fact that he was in a skilled nursing facility which was on lockdown currently due to the covert 19 national emergency. Subsequently he was discharged from the facility on this past Friday and subsequently made an appointment to come in to see yesterday. Fortunately there's no signs of active infection at this time which is good news and overall he does seem to have made progress since I last saw. Overall I feel like things are progressing quite nicely. The patient is having no pain. 11/08/18 on evaluation today patient appears to be doing okay in regard to his right gluteal ulcer. He has been utilizing a Wound VAC home health this changing this at this point since he's home from the skilled nursing facility. Fortunately there's no signs of obvious active infection at this time. Unfortunately though there's no obvious active infection he is having some maceration and his wife states that when the sheets of the Wound VAC office on Sunday when it broke seal that he ended up having significant issues with some smell as well there concerned about the possibility of infection. Fortunately there's No fevers, chills, nausea, or vomiting noted at this time. 11/15/18 on evaluation today patient actually appears to be doing well in regard to his right gluteal ulcer. He has been tolerating the dressing changes without complication. Specifically the Wound VAC has been utilized up to this point. Fortunately there's no signs of infection and overall I feel like he has made progress even since last week when I last saw him. I'm actually fairly happy with the overall appearance although he does seem to have somewhat of a hyper granular overgrowth in the central portion of the wound which I think may require some sharp debridement to try flatness out possibly utilizing chemical cauterization  following. 11/23/18 on evaluation today patient actually appears to be doing very well in regard to his sacral ulcer. He seems to be showing signs of improvement with good granulation. With that being said he still has the small area of hyper granulation right in the central portion of the wound which I'm gonna likely utilize silver nitrate on today. Subsequently he also keeps having a leak at the 6 o'clock location which is unfortunate we may be able to help out with some suggestions to try to prevent this going forward. Fortunately there's no signs of active infection at this time. 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 He's been tolerating the dressing changes without complication. Fortunately there's no signs of active infection at  this time. I've been rather pleased with how things have progressed there still some evidence of pressure getting to the area with some redness right around the immediate wound opening. Nonetheless other than this I'm not seeing any significant complications or issues the wound is somewhat hyper granular. Upon discussing with the patient and his wife today I'm not sure that the wound is being packed to the base with the foam at this point. And if it's not been packed fully that may be part of the reason why is not seen as much improvement as far as the granulation from the base out. Again we do not want pack too tightly but we need some of the firm to get to the base of the wound. I discussed this with patient and his wife today. 12/06/18 on evaluation today patient appears to be doing well in regard to his right gluteal pressure ulcer. He's been tolerating the dressing changes without complication. Fortunately there's no signs of active infection. He still has some hyper granular tissue and I do think it would be appropriate to continue with the chemical cauterization as  of today. 12/16/18 on evaluation today patient actually appears to be doing okay in regard to his right gluteal ulcer. He is been tolerating the dressing changes without complication including the Wound VAC. Overall I feel like nothing seems to be worsening I do feel like that the hyper granulation buds in the central portion of the wound have improved to some degree with the silver nitrate. We will have to see how things continue to progress. 12/20/18 on evaluation today patient actually appears to be doing much worse in my pinion even compared to last week's evaluation. Unfortunately as opposed to showing any signs of improvement the areas of hyper granular tissue in the central portion of the wound seem to be getting worse. Subsequently the wound bed itself also seems to be getting deeper even compared to last week which is both unusual as well as concerning since prior he had been shown signs of improvement. Nonetheless I think that the issue could be that he's actually having some difficulty in issues with a deeper infection. There's no external signs of infection but nonetheless I am more worried about the internal, osteomyelitis, that could be restarting. He has not been on antibiotics for some time at this point. I think that it may be a good idea to go ahead and started back on an antibiotic therapy while we wait to see what the testing shows. [name], [name] ([id number]) 12/27/18 on evaluation today patient presents for follow-up concerning his left gluteal fold wound. Fortunately he appears to be doing well today. I did review the CT scan which was negative for any signs of osteomyelitis or acute abnormality this is excellent news. Overall I feel like the surface of the wound bed appears to be doing significantly better today compared to previously noted findings. There does not appear any signs of infection nor does he have any pain at this time. 01/03/19 on evaluation today patient  actually appears to be doing quite well in regard to his ulcer. Post debridement last week he really did not have too much bleeding which is good news. Fortunately today this seems to be doing some better but we still has some of the hyper granular tissue noted in the base of the wound which is gonna require sharp debridement today as well. Overall I'm pleased with how things seem to be progressing since we switched away  from the Wound VAC I think he is making some progress. 01/10/19 on evaluation today patient appears to be doing better in regard to his right gluteal fold ulcer. He has been tolerating the dressing changes without complication. The debridement to seem to be helping with current away some of the poor hyper granular tissue bugs throughout the region of his gluteal fold wound. He's been tolerating the dressing changes otherwise without complication which is great news. No fevers, chills, nausea, or vomiting noted at this time. 01/17/19 on evaluation today patient actually appears to be doing excellent in regard to his wound. He's been tolerating the dressing changes without complication. Fortunately there is no signs of active infection at this time which is great news. No fevers, chills, nausea, or vomiting noted at this time. 01/24/19 on evaluation today patient actually appears to be doing quite well with regard to his ulcer. He has been tolerating the dressing changes without complication. Fortunately there's no signs of active infection at this time. Overall been very pleased with the progress that he seems to be making currently. 01/31/19-Patient returns at 1 week with apparent similarity in dimensions to the wound, with no signs of infection, he has been changing dressings twice a day 02/08/19 upon evaluation today patient actually appears to be doing well with regard to his right Ischial ulcer. The wound is not appear to be quite as deep and seems to be making progress which is good  news. With that being said I'm still reluctant to go back to the Wound VAC at this point. He's been having to change the dressings twice a day which is a little bit much in my pinion from the wound care supplies standpoint. I think that possibly attempting to utilize extras orbit may be beneficial this may also help to prevent any additional breakdown secondary to fluid retention in the wound itself. The patient is in agreement with giving this a try. 02/15/19 on evaluation today patient actually appears to be doing decently well with regard to his ulcer in the right to gluteal fold location. He's been tolerating the dressing changes without complication. Fortunately there is no signs of active infection at this time. He is able to keep the current dressing in place more effectively for a day at a time whereas before he was having a changes to to three times a day. The actions or has been helpful in this regard. Fortunately there's no signs of anything getting worse and I do feel like he showing signs of good improvement with regard to the wound bed status. 02/22/2019 patient appears to be doing very well today with regard to his ulcer in the gluteal fold. Fortunately there is no signs of active infection and he has been tolerating the dressing changes without any complication. Overall extremely pleased with how things seem to be progressing. He has much less of the hyper granular projections within the wound these have slowly been debrided away and he seems to be doing well. The wound bed is more uniform. 03/01/19 on evaluation today patient appears to be doing unfortunately about the same in regard to his gluteal ulcer. He's been tolerating the dressing changes without complication. Fortunately there's no signs of active infection at this time. With that being said he continues to develop these hyper granular projections which I'm unsure of exactly what they are and why they are rising. Nonetheless I  explained to the patient that I do believe it would be a good idea for [place]  to stand a biopsy sample for pathology to see if that can shed any light on what exactly may be going on here. Fortunately I do not see any obvious signs of infection. With that being said the patient has had a little bit more drainage this week apparently compared to last week. 03/08/2019 on evaluation today patient actually appears to look somewhat better with regard to the appearance of his wound bed at this time. This is good news. Overall I am very pleased with how things seem to have progressed just in the past week with a switch to the [facility] dressing. I think that has been beneficial for him. With that being said at this time the patient is concerned about his biopsy that I sent off last week unfortunately I do not have that report as of yet. Nonetheless we have called to obtain this and hopefully will hear back from the lab later this morning. 03/15/19 on evaluation today patient's wound actually appears to be doing okay today with regard to the overall appearance of the wound bed. He has been tolerating the dressing changes without complication he still has hyper granular tissue noted but fortunately that seems to be minimal at this point compared to some of what we've seen in the past. Nonetheless I do think that he is still having some issues currently with some of this type of granulation the biopsy and since all showed nothing more than just evidence of granulation tissue. Therefore there really is nothing different to initiate or do at this point. 03/24/19 on evaluation today patient appears to be doing a little better with regard to his ulcer. He's been tolerating the dressing changes without complication. Fortunately there is no signs of active infection at this time. No fevers, chills, nausea, or vomiting noted at this time. I'm overall pleased with how things seem to be progressing. 03/29/2019 on evaluation  today patient appears to be doing about the same in regard to his ulcer in the right gluteal fold. Unfortunately he is not seeming to make a lot of progress and the wound is somewhat stalled. There is no signs of active infection externally but I am concerned about the possibility of infection continuing in the ischial location which previously he did have infection noted. Again is not able to have an MRI so probably our best option for testing for this would be a triple phase bone scan which will detect subtle changes in the bone more so than plain film x-rays. In the past they really have not been beneficial and in fact the CT scans even have been somewhat questionable at times. Nonetheless there is no signs of systemic infection which is at least good news but again his wound is not healing at all on the predicted schedule. 04/05/19 on evaluation today patient appears to be doing well all things considering with regard to his wound and the right gluteal fold. He actually has his triple bone scan scheduled for sometime in the next couple of days. With that being said I've also been looking to other possibilities of what could be causing hyper granular tissue were looking into the bone scan again for evaluating for the risk or possibility of infection deeper and I'm also [name], [name]. ([id number]) go ahead and see about obtaining a deep tissue culture today to send and see if there's any evidence of infection noted on culture. He's in agreement with that plan. 04/12/2019 on evaluation today patient presents for reevaluation here  in our clinic concerning ongoing issues with his right gluteal fold ulcer. 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 It was discussed with him at that point that I think it would be appropriate for him to proceed with hyperbaric oxygen therapy  especially in light of the fact that he is previously been on IV antibiotics at the beginning of the year for close to 3 months followed by several weeks of oral antibiotics that was all prescribed by infectious disease. He had surgical debridement around Christmas December 2019 due to an abscess and osteomyelitis of the ischial bone. Unfortunately this has not really proceeded as well as we would have liked and again we did a CT scan even a couple months ago as he cannot have an MRI secondary to having issues with both a pain pump as well as a spinal cord stimulator which prevent him from going into an MRI machine. With that being said there were chronic changes noted at the ischial tuberosity which had progressed since December 2019 there was no evidence of fluid collection on that initial CT scan. With that being said that was on January 21, 2019. I am not sure that it was a sensitive enough test as compared to an MRI and then subsequently I ordered a bone scan for him which was actually completed on 03/29/2019 and this revealed that he does indeed have positive osteomyelitis involving the right ischial tuberosity. This is adjacent to the ulcer and I think is the reason that his ulcer is not healing. Subsequently I am in a place him back on oral antibiotics today unfortunately his wound culture showed just mixed gram-negative flora with no specific findings of a predominant organism. Nonetheless being with the location I think a good broad-spectrum antibiotic for gram-negative's is a good choice at this point he is previously taken Levaquin without significant issues and I think that is an appropriate antibiotic for him at this time. 04/19/2019 on evaluation today patient actually appears to be doing fairly well with regard to his wound. He has been tolerating the dressing changes without complication. Fortunately there is no signs of active infection and he has been taking the oral antibiotics at this  time. Subsequently we did make a referral to infectious disease although Dr. Steva Ready wants the patient to be seen by general surgery first in order apparently to see if there is anything from a surgical standpoint that should be done prior to initiation of IV antibiotic therapy. Again the patient is okay with actually seeing Dr. Celine Ahr whom he has seen before we will make that referral. 04/26/2019 on evaluation today patient actually appears to be doing well with regard to his wound. He has been tolerating the dressing changes without complication. Fortunately there is no signs of active infection at this time. I do believe that the hyperbaric oxygen therapy along with the antibiotics which I prescribed at this point have been doing well for him. With that being said he has not seen Dr. Celine Ahr the surgeon yet in fact they have not been contacted for scheduling an appointment as of yet. Subsequently the patient is not on antibiotics currently by IV Dr. Steva Ready did want him to see Dr. Celine Ahr first which we are working on trying to get scheduled. We again give the information to the patient today for Dr. Celine Ahr and her number as far as contacting their  office to see about getting something scheduled. Again were looking for whether or not they recommend any surgical intervention. 05/03/2019 on evaluation today patient appears to be doing about the same with regard to his wound there may be a little bit of filling in in regard to the base of the wound but still he has quite a bit of hyper granular tissue that I am seeing today. I believe that he may may need a more aggressive sharp debridement possibly even by the surgeon or under anesthesia in order to clear away some of the hyper granular material in order to help allow for appropriate granulation to fill in. We have made a referral to Dr. Celine Ahr unfortunately it sounds as if they did not receive the referral we contacted them today they should be  get in touch with the patient. Depending on how things show from that standpoint the patient may need to see Dr. Ardyth Gal who is the infectious disease specialist although he really does not want a PICC line again. No fevers, chills, nausea, vomiting, or diarrhea. He is tolerating hyperbaric oxygen therapy very well. 05/12/2019 on evaluation today patient presents for follow-up visit concerning his right gluteal fold ulcer. He did see Dr. Celine Ahr the surgeon who previously evaluated his wound and she actually felt like he was doing quite well with regard to the wound based on what she was seen. He does seem to be responding to some degree with the oral antibiotics along with hyperbaric oxygen therapy at this point she did not see any evidence or need for further surgical intervention at this point. She recommended deferring additional or ongoing antibiotic therapy to Dr. Steva Ready at her discretion. Fortunately there is no signs of active systemic infection. No fevers, chills, nausea, vomiting, or diarrhea. 10/29; patient I am seeing really for the first time today in terms of his wound. 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 He is being treated with hyperbaric oxygen for underlying osteomyelitis most recently documented by a three-phase bone scan. He has been on Levaquin for roughly a month and he is out of these and asked me for a refill. Most recent cultures were negative although I do not think these were bone cultures. He has a very irregular surface to this wound which is almost nodular in texture. It undermines superiorly with the same nodular hyper granulated surface. I see that he was referred to general surgery for an operative debridement although the surgeon did not agree with this I think that would have been the proper course of action in this. He has been using Hydrofera Blue. He is supposed to start a wound VAC tomorrow which is actually a reapplication of  wound VAC. I think mostly last time they had trouble keeping the seal in place I hope we have better look at this this time. 05/24/2019 on evaluation today I did review patient's note from Dr. Dellia Nims where he was seen last week when I was out of the office. Subsequently it does appear that Dr. Dellia Nims was in agreement that the patient really needed debridement to clear away some of the nodular tissue in the base of the wound. The good news is he has the wound VAC initiated and some of this tissue is already starting to breakdown under the treatment of the wound VAC again because it is not really viable. Nonetheless I do believe that we can likely perform some debridement today to clear this away and hopefully the  continuation of the wound VAC along with hyperbarics and antibiotic therapy will be beneficial in stopping this from reoccurring. If we get better control in the wound bed in a good spot I think we can definitely use the PriMatrix which we actually did get approval for. 05/31/2019 on evaluation today patient appears to be doing actually pretty well at this point with regard to his wound. He has been tolerating the dressing changes which is good news. Fortunately there is no signs of infection. He still has some of the hyper granular nodules noted we have not really cleared all these away and I think we can try to do a little bit more that today. I did do quite a bit last week I am hoping to get down to the base of the wound so though actually be able to heal more appropriately. I think the wound VAC is doing a good job right now with controlling the drainage and overall the patient is happy with that. 06/07/2019 upon evaluation today patient appears to be doing quite well compared to last week with regard to the hyper granulation in the base of the wound. He has been tolerating the dressing changes without complication. Fortunately there is no signs of active infection at this time. With that  being said we have not had any recent measures as far as blood work is concerned I think we may want to check a few things including a CBC, sed rate, and C-reactive protein. Patient is in agreement with that we can try to get that scheduled for him for this Friday. 06/13/2019 on evaluation today patient actually appears to be doing much better at this point based on what I am seeing with regard to his wound. He GERARD, SUDDITH. ([id number]) has much less in the way of hyper granular tissue which is good news and a lot more healing that is taken place since I last saw him as far as the amount of space within the wound itself. With that being said he is nearing the completion of the initial 40 treatments for hyperbaric oxygen therapy I think this is something I would recommend extending as he does seem to be benefiting at this time. Fortunately there is no evidence of active systemic infection at this point nor even local infection for that matter. 06/20/2019 upon evaluation today patient actually appears to be doing well with regard to his wound. In fact this appears to be doing much better today compared to what it was previous. I been extremely pleased with the progress that he is made. In fact the tunnel is much less deep today than what it was in the past and I am seeing excellent signs of improvement. Overall I am happy with how things are going. I did review his white blood cell count which revealed everything to be okay. The one abnormal finding on his CBC was a hemoglobin of 12.7. Subsequently I did also review his sed rate and C-reactive protein. His sed rate measured in at normal level measuring at 26. The C-reactive protein however was elevated today and an abnormal range indicating inflammation. Still I feel like he is trending in a good direction at this time. He is going to need a refill of the Levaquin he tells me at this point. 07/04/2019 on evaluation today patient actually appears  to be doing well in regard to his wound. He has been tolerating the dressing changes without complication. Fortunately there is no signs of active infection which  is good news. No fevers, chills, nausea, vomiting, or diarrhea. The good news also is that I did review his labs and though his sed rate was slightly elevated the C-reactive protein was actually down compared to the last evaluation 2 weeks ago this is excellent news I think it is showing signs of improvement. 07/11/2019 on evaluation today patient actually appears to be doing quite well with regard to his wound on my evaluation. This is very slow going but nonetheless he seems to be making progress. Especially in regard to the depth. Nonetheless I believe that we may want to consider going forward with the PriMatrix which was previously approved for him. We will get a have to get a new approval as we will actually be applying this in the new year so we will likely obtain that approval on January 4. In the meantime and for the time being my suggestion is going to be that we go ahead and continue with the wound VAC and we were going at this point with the current wound care measures. 12/28-Patient seen after HBO session today, wound depth with the tunnel measures 3.2 cm, the wound bed appears healthy, continuing with the VAC and HBO 07/25/2019 on evaluation today patient's wound appears to be doing about the same compared to my last evaluation. Fortunately there is no evidence of significant infection which is good news we will still waiting on the results of his lab work which unfortunately was not done prior to today's visit which is what we were aiming for. Ultimately there is no sign of active infection at this time which is good news. And he also tells me that the wound VAC has been staying in place and doing fairly well which is also good news. With that being said I am concerned about the fact that the wound has not progressed as much I  think we may want to look into getting reapproval for the skin substitute, specifically PriMatrix, that I am hopeful will help to allow this area to granulate in more effectively. I would recommend as well continuing with the wound VAC along with this. 08/01/2019 on evaluation today patient appears to be doing in my opinion slightly better in regard to the wound. He actually does have some tightening of the walls around the edges which is a good thing. Fortunately there is no signs of active infection at this time. No fever chills noted. I do believe that the patient in general seems to be making progress although is very slow. Obviously were trying to get things to speed up somewhat more. No fevers, chills, nausea, vomiting, or diarrhea. 08/08/2019 upon evaluation today patient appears to be doing well with regard to his wound although there is not a significant improvement overall we do have the PriMatrix for availability today to apply. Fortunately there is no signs of active infection and the patient overall seems to be doing well. There is really no need for aggressive sharp debridement at this point either which is also good news. I do believe her get a need to suture and the primary checks in the basin in the PriMatrix in order to keep it in place at the base of the wound underneath the wound VAC. This was discussed with the patient today as well. He is in agreement the plan. He really does not have any filling in the area and so this should not be a complication as far as suturing is concerned. 08/15/2019 on evaluation today patient appears  to be doing somewhat better in my opinion in regard to the overall appearance of his wound. He has been tolerating the dressing changes without complication. Fortunately the skin subappear to still be in place when this was changed on Friday. With that being said it appears that the PriMatrix may have dissolved in the interim between now and then as the sutures  are no longer holding anything in place and again I would sutured it into place. Nonetheless I believe that we can go ahead and likely consider going forward that we may need to apply the PriMatrix weekly as opposed every other week as it does seem to have dissolved rather readily and well. Overall the wound bed appears to be doing excellent at this time. 08/22/2019 upon evaluation today the patient does seem to making some progress with regard to his wound. Overall there is some better quality tissue and less of the hyper granular protrusions all of which is good news. With that being said we do have the PriMatrix available for application today and I am in a try to find a better way to suture this in place for him today. 08/29/2019 on evaluation today patient appears to be doing very well in regard to his wound. I do see signs of new granulation which seems to be somewhat helpful he did have some need for sharp debridement today to clear some of the hyper granulation away but nonetheless I feel like we are headed in a good direction. Fortunately there is no evidence of any systemic infection although we are going to go ahead and reorder the CBC, C-reactive protein, and sed rate today to see where things stand I did just refill his Levaquin as well. 09/05/2019 upon evaluation today patient's wound actually is not appearing to be showing signs of significant improvement compared to what we have seen previous from the standpoint of depth. He overall wound size wise seems to be about the same and again some of the quality of the wound bed is still doing fairly well. I did review his labs and his CBC was normal with no signs of elevation white blood cell count, his C-reactive protein was normal, and the sed rate was slightly elevated but again I think this may just be due to ulceration really there is nothing significant to indicate severe infection. I think when he likely finishes his current round of oral  antibiotics with Levaquin will likely take him off of the medication at that point based on the findings. I do feel like the hyperbarics helped in getting the infection under control as well as improving the overall size of the wound quite significantly. With that being said at this point I really feel like more more at the time frame that he may benefit from a muscle flap in order to try to help this wound heal effectively. I think that this would be the fastest way in order to do this. In the past he has been somewhat resistant to going down that path but nonetheless I feel like he likely should. JABORIS, MATHER ([id number]) 09/12/2019 on evaluation today patient appears to be doing fairly well in regard to his wound. He is tolerating the wound VAC without complication is still continues to drain significantly. Fortunately there is no signs of active infection at this time which is great news his lab work is also been doing very well. With that being said I do think at this point that the patient is still deriving  benefit from the wound VAC especially in regard to controlling moisture which I think it is doing fairly well. He also has some improvement in the overall wound bed and on the medial aspect of the wound there are some hyper granular projections but nothing as severe as what they have been in the past. I am good have to perform some sharp debridement at this location today. 09/19/2019 upon evaluation today patient appears to be doing roughly the same although again the tissue does seem to be showing some signs of improvement which is good news. There is less hyper granular budding. Nonetheless he still is going require some sharp debridement today. We did get his albumin and prealbumin results back and they are within normal limits in both cases which is excellent news that was the one thing [facility] plastic surgery was waiting on order to get him scheduled. He does seem to be a good candidate  physically for the flap surgery if he decides to go that direction. With that being said is still left to be determined once they see the wound what they feel about how best to proceed. Obviously in the end it would be up to Mr. Bellis as to whether he goes forward with the flap surgery or something else depending on the recommendations. 09/26/2019 upon evaluation today patient appears to be doing about the same in regard to his wound. Fortunately there is no signs of active infection there is also not as much hyper granulation noted today in fact I do not believe there is can be any sharp debridement required at this point. With that being said I do believe he continues to be somewhat stalled at this point with regard to his wound. There is no signs of active infection at this time which is good news. He does have an appointment with [facility] plastic surgery on Friday. 10/03/2019 upon evaluation today patient appears to be doing somewhat poorly especially in his overall demeanor compared to last week's evaluation. He unfortunately did see the physician at [facility] plastic surgery. Unfortunately the plan for the possibility of a flap would be a fairly significant surgery that the patient is really not wanting to take the risk of as if the surgery failed he would end up with a very large open wound at that point. Fortunately there is no signs of active infection at this time. No fevers, chills, nausea, vomiting, or diarrhea. With that being said there was some question that the physician had about why this would not be healing and the possibility that there may be some bone still needs to be removed deeper in the wound. Again the bone scan that we did previous actually did show evidence of there still being a chronic osteomyelitis back on March 29, 2019. With that being said he did undergo treatment both with hyperbarics as well as oral antibiotics for quite some time since that point. Obviously he may  need additional and updated imaging with the surgeon in order to see if there is anything else that can or will need to be done in order to help this area to heal. Obviously I still believe as well that there is something going on here. He is already seeing the surgeon that performed the original surgery after the bone scan but they did not feel that anything needed to be done from a surgical standpoint at that time. 10/10/2019 upon evaluation today patient appears to be really doing about the same with regard to his wound  in the gluteal fold region. Unfortunately he is not making a lot of improvement the insurance also contacted home health and apparently the wound VAC is no longer to be covered due to lack of progress. Obviously that is something that we discussed as well as a strong possibility for happening soon and this was even last week that discussed this with him. With that being said again we have tried to manage this wound as best as I could up to this point. Fortunately there is no evidence of active infection at this time. No fevers, chills, nausea, vomiting, or diarrhea. 10/17/2019 upon evaluation today patient has decided to proceed with the flap surgery. He actually seems to be in better spirits today he states he wants to get this healed and after talking to the surgeon last week he feels like that is his best option for doing so. With that being said he also needs to have a referral to infectious disease to be seen for IV antibiotics preoperatively and postoperatively as far as the skin flap is concerned. He wonders if Dr. Steva Ready would be interested or available to do this for him. I explained that I would definitely have to check with her and I will be happy to do that but I am not certain What her answer will be in 2 I have a discussion with her. 10/24/2019 upon evaluation today patient appears to be doing about the same with regard to his wound although he is complaining of  increased pain he is really not had any discomfort he states that it is getting much worse at this point. Fortunately there is no signs of active infection. At least not systemically. With that being said I question whether local infection could be a problem here secondary to the fact that to be honest the patient is having pain where he has not previous. 10/31/2019 upon evaluation today patient appears to be doing about the same in regard to his wound in the right gluteal fold region. Subsequently in the interim since I last saw him last week I did receive a message from Dr. Steva Ready as well as reviewed her note. She was requesting the potential for a bone culture or at least a deep tissue culture in order to see about helping with more directed antibiotic therapy for the patient's projected flap surgery. If we could not obtain more specific organisms for treatment then obviously should be looking at more broad-spectrum antibiotic therapy. With that being said at this point the patient seems ready to move forward with the flap surgery. I did actually sign the paperwork for his bed which came through last week I believe that was on Thursday or Friday. Either way that should be coming to him as well so he will have that in place once the surgery occurs when he is discharged home. Fortunately there is no signs of active infection at this time. No fevers, chills, nausea, vomiting, or diarrhea. Electronic Signature(s) Signed: 10/31/2019 10:55:02 AM By: Worthy Keeler [name] Entered By: Worthy Keeler on 10/31/2019 10:55:02 [name], [name] ([id number]) -------------------------------------------------------------------------------- Physical Exam Details Patient Name: [name] Date of Service: 10/31/2019 8:00 AM Medical Record Number: [id number] Patient Account Number: [id number] Date of Birth/Sex: 12-25-1950 (69 y.o. M) Treating RN: Army Melia Primary Care Provider: [name]  Other Clinician: Referring Provider: [name] Treating Provider/Extender: [name], Patrice Moates Weeks in Treatment: 100 Constitutional Well-nourished and well-hydrated in no acute distress. Respiratory normal breathing without difficulty. Psychiatric this patient  is able to make decisions and demonstrates good insight into disease process. Alert and Oriented x 3. pleasant and cooperative. Notes Upon evaluation patient's wound bed actually showed signs of hyper granulation which is part of what we have been fighting up to this point. Fortunately there is no signs of active infection at this time which is great news. No fevers, chills, nausea, vomiting, or diarrhea. I did actually obtain a deep tissue culture sample to be sent to the lab for further evaluation and treatment. This was part of a deeper debridement of some of the tissue here today. Electronic Signature(s) Signed: 10/31/2019 10:57:16 AM By: Worthy Keeler [name] Entered By: Worthy Keeler on 10/31/2019 10:57:16 [name], [name] ([id number]) -------------------------------------------------------------------------------- Physician Orders Details Patient Name: [name] Date of Service: 10/31/2019 8:00 AM Medical Record Number: [id number] Patient Account Number: [id number] Date of Birth/Sex: 04/24/1951 (69 y.o. M) Treating RN: Army Melia Primary Care Provider: [name] Other Clinician: Referring Provider: [name] Treating Provider/Extender: [name], Warrick Llera Weeks in Treatment: 100 Verbal / Phone Orders: No Diagnosis Coding Wound Cleansing Wound #1 Right Gluteal fold o Clean wound with Normal Saline. - in office o Cleanse wound with mild soap and water Anesthetic (add to Medication List) Wound #1 Right Gluteal fold o Topical Lidocaine 4% cream applied to wound bed prior to debridement (In Clinic Only). Skin Barriers/Peri-Wound Care o Skin Prep Primary Wound Dressing Wound #1  Right Gluteal fold o Silver Alginate Secondary Dressing Wound #1 Right Gluteal fold o Tegaderm o XtraSorb Dressing Change Frequency Wound #1 Right Gluteal fold o Change dressing every other day. Follow-up Appointments Wound #1 Right Gluteal fold o Return Appointment in 2 weeks. Off-Loading Wound #1 Right Gluteal fold o Turn and reposition every 2 hours - no pressure on wounded area Home Health Wound #1 Right Gluteal fold o [place] Visits - [place] Nurse may visit PRN to address patientos wound care needs. o FACE TO FACE ENCOUNTER: MEDICARE and MEDICAID PATIENTS: I certify that this patient is under my care and that I had a face-to- face encounter that meets the physician face-to-face encounter requirements with this patient on this date. The encounter with the patient was in whole or in part for the following MEDICAL CONDITION: (primary reason for [place]) MEDICAL NECESSITY: I certify, that based on my findings, NURSING services are a medically necessary home health service. HOME BOUND STATUS: I certify that my clinical findings support that this patient is homebound (i.e., Due to illness or injury, pt requires aid of supportive devices such as crutches, cane, wheelchairs, walkers, the use of special transportation or the assistance of another person to leave their place of residence. There is a normal inability to leave the home and doing so requires considerable and taxing effort. Other absences are for medical reasons / religious services and are infrequent or of short duration when for other reasons). o If current dressing causes regression in wound condition, may D/C ordered dressing product/s and apply Normal Saline Moist Dressing daily until next [place] / Other MD appointment. [place] of regression in wound condition at [phone]. o Please direct any NON-WOUND related issues/requests  for orders to patient's Primary Care Physician Negative Pressure Wound Therapy [name], [name]. ([id number]) Wound #1 Right Gluteal fold o Discontinue NPWT. Laboratory o Bacteria identified in Wound by Culture (MICRO) - Tissue culture right glut oooo LOINC Code: [id number] oooo Convenience Name: Wound culture routine Electronic Signature(s) Signed:  10/31/2019 12:33:51 PM By: Army Melia Signed: 11/01/2019 6:10:11 PM By: Worthy Keeler [name] Entered By: Army Melia on 10/31/2019 08:42:46 [name], [name] ([id number]) -------------------------------------------------------------------------------- Problem List Details Patient Name: [name] Date of Service: 10/31/2019 8:00 AM Medical Record Number: [id number] Patient Account Number: [id number] Date of Birth/Sex: [birth year] (69 y.o. 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 He states that there is no history of diabetes that he is aware of. He does have issues with his prostate and is currently receiving treatment for this by way of oral medication. With that being said I do not have a lot of details in that regard. Nonetheless the patient presents today as a result of having been referred to [place] by another provider initially home health was set to come out and take care of his wound although due to the fact that he apparently drives he's not able to receive home health. His wife is therefore trying to help take care of this wound within although they have been struggling with what exactly to do at this point. She states that she can do some things but she is definitely not a nurse and does have some issues with looking at blood. The good news is the wound does not appear to be too deep and is fairly superficial at this point. There is no slough noted there is some nonviable skin noted around the surface of the wound and the perimeter at this point. The central portion of the wound appears to be very good with a dermal layer noted this does not appear to be again deep enough to extend it to subcutaneous tissue at this point. Overall the patient for a paraplegic seems to be functioning fairly well he does have both a spinal cord stimulator as well is the  intrathecal pump. In the pump he has Dilaudid and baclofen. 12/07/17 on evaluation today patient presents for follow-up concerning his ongoing lower back thigh ulcer on the right. He states that he did not get the supplies ordered and therefore has not really been able to perform the dressing changes as directed exactly. His wife was able to get some Boarder Foam Dressing's from the  drugstore and subsequently has been using hydrogel which did help to a degree in the wound does appear to be able smaller. There is actually more drainage this week noted than previous. 12/21/17 on evaluation today patient appears to be doing rather well in regard to his right gluteal ulcer. He has been tolerating the dressing changes without complication. There does not appear to be any evidence of infection at this point in time. Overall the wound does seem to be making some progress as far as the edges are concerned there's not as much in the way of overlapping of the external wound edges and he has a good epithelium to wound bed border for the most part. This however is not true right at the 12 o'clock location over the span of a little over a centimeters which actually will require debridement today to clean this away and hopefully allow it to continue to heal more appropriately. 12/28/17 on evaluation today patient appears to be doing rather well in regard to his ulcer in the left gluteal region. He's been tolerating the dressing changes without complication. Apparently he has had some difficulty getting his dressing material. Apparently there's been some confusion with ordering we're gonna check into this. Nonetheless overall he's been showing signs of improvement which is good news. Debridement is not required today. 01/04/18 on evaluation today patient presents for follow-up concerning his right gluteal ulcer. He has been tolerating the dressing changes fairly well. On inspection today it appears he may actually have  some maceration them concerned about the fact that he may be developing too much moisture in and around the wound bed which can cause delay in healing. With that being said he unfortunately really has not showed significant signs of improvement since last week's evaluation in fact this may even be just the little bit/slightly larger. Nonetheless he's been having a lot of discomfort I'm not sure this is even related to the wound as he has no pain when I'm to breeding or otherwise cleaning the wound during evaluation today. Nonetheless this is something that we did recommend he talked to his pain specialist concerning. 01/11/18 on evaluation today patient appears to be doing better in regard to his ulceration. He has been tolerating the dressing changes without complication. With that being said overall there's no evidence of infection which is good news. The only thing is he did receive the hatch affair blue classic versus the ready nonetheless I feel like this is perfectly fine and appears to have done well for him over the past week. 01/25/18 on evaluation today patient's wound actually appears to be a little bit larger than during the last evaluation. The good news is the majority of the wound edges actually appear to be fairly firmly attached to the wound bed unfortunately again we're not really making progress in regard to the size. Roughly the wound is about the same size as when I first saw him although again the wound margin/edges appear to be much better. 02/01/18 on evaluation today patient actually appears to be doing very well in regard to his wound. Applying the Prisma dry does seem to be better although he does still have issues with slow progression of the wound. There was a slight improvement compared to last week's measurements today. Nonetheless I have been considering other options as far as the possibility of Theraskin or even a snap vac. In general I'm not sure that the Theraskin due to  location of the wound would be a  very good idea. Nonetheless I do think that a snap vac could be a possibility for the patient and in fact I think this could even be an excellent way to manage the wound possibly seeing some improvement in a very rapid fashion here. Nonetheless this is something that we would need to get approved and I did have a lengthy conversation with the patient about this today. 02/08/18 on evaluation today patient appears to be doing a little better in regard to his ulcer. He has been tolerating the dressing changes without complication. Fortunately despite the fact that the wound is a little bit smaller it's not significantly so unfortunately. We have discussed the possibility of a snap vac we did check with insurance this is actually covered at this point. Fortunately there does not appear to be any sign of infection. Overall I'm SANEL, SEUFERT. ([id number]) fairly pleased with how things seem to be appearing at this point. 02/15/18 on evaluation today patient appears to be doing rather well in regard to his right gluteal ulcer. Unfortunately the snap vac did not stay in place with his sheer and friction this came loose and did not seem to maintain seal very well. He worked for about two days and it did seem to do very well during that time according to his wife but in general this does not seem to be something that's gonna be beneficial for him long-term. I do believe we need to go back to standard dressings to see if we can find something that will be of benefit. 03/02/18- He is here in follow up evaluation; there is minimal change in the wound. He will continue with the same treatment plan, would consider changing to iodosrob/iodoflex if ulcer continues to to plateau. He will follow up next week 03/08/18 on evaluation today patient's wound actually appears to be about the same size as when I previously saw him several weeks back. Unfortunately he does have some slightly  dark discoloration in the central portion of the wound which has me concerned about pressure injury. I do believe he may be sitting for too long a period of time in fact he tells me that "I probably sit for much too long". He does have some Slough noted on the surface of the wound and again as far as the size of the wound is concerned I'm really not seeing anything that seems to have improved significantly. 03/15/18 on evaluation today patient appears to be doing fairly well in regard to his ulcer. The wound measured pretty much about the same today compared to last week's evaluation when looking at his graph. With that being said the area of bruising/deep tissue injury that was noted last week I do not see at this point. He did get a new cushion fortunately this does seem to be have been of benefit in my pinion. It does appear that he's been off of this more which is good news as well I think that is definitely showing in the overall wound measurements. With that being said I do believe that he needs to continue to offload I don't think that the fact this is doing better should be or is going to allow him to not have to offload and explain this to him as well. Overall he seems to be in agreement the plan I think he understands. The overall appearance of the wound bed is improved compared to last week I think the Iodoflex has been beneficial in that regard. 03/29/18 on evaluation  today patient actually appears to be doing rather well in regard to his wound from the overall appearance standpoint he does have some granulation although there's some Slough on the surface of the wound noted as well. With that being said he unfortunately has not improved in regard to the overall measurement of the wound in volume or in size. I did have a discussion with him very specifically about offloading today. He actually does work although he mainly is just sitting throughout the day. He tells me he offloads by "lifting  himself up for 30 seconds off of his chair occasionally" purchase from [employer] which does seem to have helped. And he has a new cushion that he with that being said he's also able to stand some for a very short period of time but not significant enough I think to provide appropriate offloading. I think the biggest issue at this point with the wound and the fact is not healing as quickly as we would like is due to the fact that he is really not able to appropriately offload while at work. He states the beginning after his injury he actually had a bed at his job that he could lay on in order to offload and that does seem to have been of help back at that time. Nonetheless he had not done this in quite some time unfortunately. I think that could be helpful for him this is something I would like for him to look into. 04/05/18 on evaluation today patient actually presents for follow-up concerning his right gluteal ulcer. Again he really is not significantly improved even compared to last week. He has been tolerating the dressing changes without complication. With that being said fortunately there appears to be no evidence of infection at this time. He has been more proactive in trying to offload. 04/12/18 on evaluation today patient actually appears to be doing a little better in regard to his wound and the right gluteal fold region. He's been tolerating the dressing changes since removing the oasis without complication. However he was having a lot of burning initially with the oasis in place. He's unsure of exactly why this was given so much discomfort but he assumes that it was the oasis itself causing the problem. Nonetheless this had to be removed after about three days in place although even those three days seem to have made a fairly good improvement in regard to the overall appearance of the wound bed. In fact is the first time that he's made any improvement from the standpoint of  measurements in about six weeks. He continues to have no discomfort over the area of the wound itself which leads me to wonder why he was having the burning with the oasis when he does not even feel the actual debridement's themselves. I am somewhat perplexed by this. 04/19/18 on evaluation today patient's wound actually appears to be showing signs of epithelialization around the edge of the wound and in general actually appears to be doing better which is good news. He did have the same burning after about three days with applying the Endoform last week in the same fashion that I would generally apply a skin substitute. This seems to indicate that it's not the oasis to cause the problem but potentially the moisture buildup that just causes things to burn or there may be some other reaction with the skin prep or Steri-Strips. Nonetheless I'm not sure that is gonna be able to tolerate any skin substitute for  a long period of time. The good news is the wound actually appears to be doing better today compared to last week and does seem to finally be making some progress. 04/26/18 on evaluation today patient actually appears to be doing rather well in regard to his ulcer in the right gluteal fold. He has been tolerating the dressing changes without complication which is good news. The Endoform does seem to be helping although he was a little bit more macerated this week. This seems to be an ongoing issue with fluid control at this point. Nonetheless I think we may be able to add something like Drawtex to help control the drainage. 05/03/18 on evaluation today patient appears to actually be doing better in regard to the overall appearance of his wound. He has been tolerating the dressing changes without complication. Fortunately there appears to be no evidence of infection at this time. I really feel like his wound has shown signs as of today of turning around last week I thought so as well and definitely he  could be seen in this week's overall appearance and measurements. In general I'm very pleased with the fact that he finally seems to be making a steady but sure progress. The patient likewise is very pleased. 05/17/18 on evaluation today patient appears to be doing more poorly unfortunately in regard to his ulcer. He has been tolerating the dressing changes without complication. With that being said he tells me that in the past couple of days he and his wife have noticed that we did not seem to be doing quite as well is getting dark near the center. Subsequently upon evaluation today the wound actually does appear to be doing worse compared to previous. He has been tolerating the dressing changes otherwise and he states that he is not been sitting up anymore than he was in the past from what he tells me. Still he has continued to work he states "I'm tired of dealing with this and if I have to just go home and lay in the bed all the time that's what I'll do". Nonetheless I am concerned about the fact that this wound does appear to be deeper than what it was previous. 05/24/18 upon evaluation today patient actually presents after having been in the hospital due to what was presumed to be sepsis secondary to the wound infection. He had an elevated white blood cell count between 14 and 15. With that being said he does seem to be doing somewhat better now. [name], [name] ([id number]) His wound still is giving him some trouble nonetheless and he is obviously concerned about the fact likely talked about that this does seem to go more deeply than previously noted. I did review his wound culture which showed evidence of Staphylococcus aureus him and group B strep. Nonetheless he is on antibiotics, Levaquin, for this. Subsequently I did review his intake summary from the hospital as well. I also did look at the CT of the lumbar spine with contrast that was performed which showed no bone destruction to  suggest lumbar disguises/osteomyelitis or sacral osteomyelitis. There was no paraspinal abscess. Nonetheless it appears this may have been more of just a soft tissue infection at this point which is good news. He still is nonetheless concerned about the wound which again I think is completely reasonable considering everything he's been through recently. 05/31/18 on evaluation today on evaluation today patient actually appears to be showing signs of his wound be a little bit deeper  than what I would like to see. Fortunately he does not show any signs of significant infection although his temperature was 99 today he states he's been checking this at home and has not been elevated. Nonetheless with the undermining that I'm seeing at this point I am becoming more concerned about the wound I do think that offloading is a key factor here that is preventing the speedy recovery at this point. There does not appear to be any evidence of again over infection noted. He's been using Santyl currently. 06/07/18 the patient presents today for follow-up evaluation regarding the left ulcer in the gluteal region. He has been tolerating the Wound VAC fairly well. He is obviously very frustrated with this he states that to mean is really getting in his way. There does not appear to be any evidence of infection at this time he does have a little bit of odor I do not necessarily associate this with infection just something that we sometimes notice with Wound VAC therapy. With that being said I can definitely catch a tone of discontentment overall in the patient's demeanor today. This when he was previously in the hospital an CT scan was done of the lumbar region which did not reveal any signs of osteomyelitis. With that being said the pelvis in particular was not evaluated distinctly which means he could still have some osteonecrosis I. Nonetheless the Wound VAC was started on Thursday I do want to get this little bit more  time before jumping to a CT scan of the pelvis although that is something that I might would recommend if were not see an improvement by that time. 06/14/18 on evaluation today patient actually appears to be doing about the same in regard to his right gluteal ulcer. Again he did have a CT scan of the lumbar spine unfortunately this did not include the pelvis. Nonetheless with the depth of the wound that I'm seeing today even despite the fact that I'm not seeing any evidence of overt cellulitis I believe there's a good chance that we may be dealing with osteomyelitis somewhere in the right Ischial region. No fevers, chills, nausea, or vomiting noted at this time. 06/21/18 on evaluation today patient actually appears to be doing about the same with regard to his wound. The tunnel at 6 o'clock really does not appear to be any deeper although it is a little bit wider. I think at this point you may want to start packing this with white phone. Unfortunately I have not got approval for the CT scan of the pelvis as of yet due to the fact that Medicare apparently has been denied it due to the diagnosis codes not being appropriate according to Medicare for the test requested. With that being said the patient cannot have an MRI and therefore this is the only option that we have as far as testing is concerned. The patient has had infection and was on antibiotics and been added code for cellulitis of the bottom to see if this will be appropriate for getting the test approved. Nonetheless I'm concerned about the infection have been spread deeper into the Ischial region. 06/28/18 on evaluation today patient actually appears to be doing rather well all things considered in regard to the right gluteal ulcer. He has been tolerating the dressing changes without complication. With that being said the Wound VAC he states does have to be replaced almost every day or at least reinforced unfortunately. Patient actually has his  CT scan later  this morning we should have the results by tomorrow. 07/05/18 on evaluation today patient presents for follow-up concerning his right Ischial ulcer. He did see the surgeon Dr. Lysle Pearl last week. They were actually very happy with him and felt like he spent a tremendous amount of time with them as far as discussing his situation was concerned. In the end Dr. Lysle Pearl did contact me as well and determine that he would not recommend any surgical intervention at this point as he felt like it would not be in the patient's best interest based on what he was seeing. He recommended a referral to infectious disease. Subsequently this is something that Dr. Ines Bloomer office is working on setting up for the patient. As far as evaluation today is concerned the patient's wound actually appears to be worse at this point. I am concerned about how things are progressing and specifically about infection. I do not feel like it's the deeper but the area of depth is definitely widening which does have me concerned. No fevers, chills, nausea, or vomiting noted at this time. I think that we do need initiate antibiotic therapy the patient has an allow allergy to amoxicillin/penicillin he states that he gets a rash since childhood. Nonetheless she's never had the issues with Catholics or cephalosporins in general but he is aware of. 07/27/18 on evaluation today patient presents following admission to the hospital on 07/09/18. He was subsequently discharged on 07/20/18. On 07/15/18 the patient underwent irrigation and debridement was soft tissue biopsy and bone biopsy as well as placement of a Wound VAC in the OR by Dr. Celine Ahr. During the hospital course the patient was placed on a Wound VAC and recommended follow up with surgery in three weeks actually with Dr. Delaine Lame who is infectious disease. The patient was on vancomycin during the hospital course. He did have a bone culture which showed evidence of chronic  osteomyelitis. He also had a bone culture which revealed evidence of methicillin-resistant staph aureus. He is updated CT scan 07/09/18 reveals that he had progression of the which was performed on wound to breakdown down to the trochanter where he actually had irregularities there as well suggestive of osteomyelitis. This was a change just since 9 December when we last performed a CT scan. Obviously this one had gone downhill quite significantly and rapidly. At this point upon evaluation I feel like in general the patient's wound seems to be doing fairly well all things considered upon my evaluation today. Obviously this is larger and deeper than what I previously evaluated but at the same time he seems to be making some progress as far as the appearance of the granulation tissue is concerned. I'm happy in that regard. No fevers, chills, nausea, or vomiting noted at this time. He is on IV vancomycin and Rocephin at the facility. He is currently in [facility]. 08/03/18 upon evaluation today patient's wound appears to be doing better in regard to the overall appearance at this point in time. Fortunately he's been tolerating the Wound VAC without complication and states that the facility has been taking excellent care of the wound site. Overall I see some Slough noted on the surface which I am going to attempt sharp debridement today of but nonetheless other than this I feel like he's making progress. 08/09/18 on evaluation today patient's wound appears to be doing much better compared to even last week's evaluation. Do believe that the Wound VAC is been of great benefit for him. He has been tolerating  the dressing changes that is the Wound VAC without any complication and he has excellent granulation noted currently. There is no need for sharp debridement at this point. 08/16/18 on evaluation today patient actually appears to be doing very well in regard to the wound in the right gluteal fold  region. This is showing signs of progress and again appears to be very healthy which is excellent news. Fortunately there is no sign of active infection by way of odor or drainage at ALEJO, RAYO. ([id number]) this point. Overall I'm very pleased with how things stand. He seems to be tolerating the Wound VAC without complication. 08/23/18 on evaluation today patient actually appears to be doing better in regard to his wound. He has been tolerating the Wound VAC without complication and in fact it has been collecting a significant amount of drainage which I think is good news especially considering how the wound appears. Fortunately there is no signs of infection at this time definitely nothing appears to be worse which is good news. He has not been started on the Bactrim and Flagyl that was recommended by Dr. Delaine Lame yet. I did actually contact her office this morning in order to check and see were things are that regard their gonna be calling me back. 08/30/18 on evaluation today patient actually appears to show signs of excellent improvement today compared to last evaluation. The undermining is getting much better the wound seems to be feeling quite nicely and I'm very pleased that the granulation in general. With that being said overall I feel like the patient has made excellent progress which is great news. No fevers, chills, nausea, or vomiting noted at this time. 09/06/18 on evaluation today patient actually appears to be doing rather well in regard to his right gluteal ulcer. This is showing signs of improvement in overall I'm very pleased with how things seem to be progressing. The patient likewise is please. Overall I see no evidence of infection he is about to complete his oral antibiotic regimen which is the end of the antibiotics for him in just about three days. 09/13/18 on evaluation today patient's right Ischial ulcer appears to be showing signs of continued improvement which is  excellent news. He's been tolerating the dressing changes without complication. Fortunately there's no signs of infection and the wound that seems to be doing very well. 09/28/18 on evaluation today patient appears to be doing rather well in regard to his right Ischial ulcer. He's been tolerating the Wound VAC without complication he knows there's much less drainage than there used to be this obviously is not a bad thing in my pinion. There's no evidence of infection despite the fact is but nothing about it now for several weeks. 10/04/18 on evaluation today patient appears to be doing better in regard to his right Ischial wound. He has been tolerating the Wound VAC without complication and I do believe that the silver nitrate last week was beneficial for him. Fortunately overall there's no evidence of active infection at this time which is great news. No fevers, chills, nausea, or vomiting noted at this time. 10/11/18 on evaluation today patient actually appears to be doing rather well in regard to his Ischial ulcer. He's been tolerating the Wound VAC still without complication I feel like this is doing a good job. No fevers, chills, nausea, or vomiting noted at this time. 11/01/18 on evaluation today patient presents after having not been seen in our clinic for several weeks secondary to the  fact that he was on evaluation today patient presents after having not been seen in our clinic for several weeks secondary to the fact that he was in a skilled nursing facility which was on lockdown currently due to the covert 19 national emergency. Subsequently he was discharged from the facility on this past Friday and subsequently made an appointment to come in to see yesterday. Fortunately there's no signs of active infection at this time which is good news and overall he does seem to have made progress since I last saw. Overall I feel like things are progressing quite nicely. The patient is having no  pain. 11/08/18 on evaluation today patient appears to be doing okay in regard to his right gluteal ulcer. He has been utilizing a Wound VAC home health this changing this at this point since he's home from the skilled nursing facility. Fortunately there's no signs of obvious active infection at this time. Unfortunately though there's no obvious active infection he is having some maceration and his wife states that when the sheets of the Wound VAC office on Sunday when it broke seal that he ended up having significant issues with some smell as well there concerned about the possibility of infection. Fortunately there's No fevers, chills, nausea, or vomiting noted at this time. 11/15/18 on evaluation today patient actually appears to be doing well in regard to his right gluteal ulcer. He has been tolerating the dressing changes without complication. Specifically the Wound VAC has been utilized up to this point. Fortunately there's no signs of infection and overall I feel like he has made progress even since last week when I last saw him. I'm actually fairly happy with the overall appearance although he does seem to have somewhat of a hyper granular overgrowth in the central portion of the wound which I think may require some sharp debridement to try flatness out possibly utilizing chemical cauterization following. 11/23/18 on evaluation today patient actually appears to be doing very well in regard to his sacral ulcer. He seems to be showing signs of improvement with good granulation. With that being said he still has the small area of hyper granulation right in the central portion of the wound which I'm gonna likely utilize silver nitrate on today. Subsequently he also keeps having a leak at the 6 o'clock location which is unfortunate we may be able to help out with some suggestions to try to prevent this going forward. Fortunately there's no signs of active infection at this time. 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 He's been tolerating the dressing changes without complication. Fortunately there's no signs of active infection at this time. I've been rather pleased with how things have progressed there still some evidence of pressure getting to the area with some redness right around the immediate wound opening. Nonetheless other than this I'm not seeing any significant complications or issues the wound is somewhat hyper granular. Upon discussing with the patient and his wife today I'm not sure that the wound is being packed to the base with the foam at this point. And if it's not been packed fully that may be part of the reason why is not seen as much improvement as far as the granulation from the base out. Again we do not want pack too tightly but we need some of the firm to get to the base of the wound. I discussed  this with patient and his wife today. 12/06/18 on evaluation today patient appears to be doing well in regard to his right gluteal pressure ulcer. He's been tolerating the dressing changes without complication. Fortunately there's no signs of active infection. He still has some hyper granular tissue and I do think it would be appropriate to continue with the chemical cauterization as of today. 12/16/18 on evaluation today patient actually appears to be doing okay in regard to his right gluteal ulcer. He is been tolerating the dressing changes without complication including the Wound VAC. Overall I feel like nothing seems to be worsening I do feel like that the hyper granulation buds in the central portion of the wound have improved to some degree with the silver nitrate. We will have to see how things continue to progress. 12/20/18 on evaluation today patient actually appears to be doing much worse in my pinion even compared to last week's evaluation. Unfortunately as opposed to showing  any signs of improvement the areas of hyper granular tissue in the central portion of the wound seem to be getting worse. Subsequently the wound bed itself also seems to be getting deeper even compared to last week which is both unusual as well as concerning since TYREECE, DIENES. ([id number]) prior he had been shown signs of improvement. Nonetheless I think that the issue could be that he's actually having some difficulty in issues with a deeper infection. There's no external signs of infection but nonetheless I am more worried about the internal, osteomyelitis, that could be restarting. He has not been on antibiotics for some time at this point. I think that it may be a good idea to go ahead and started back on an antibiotic therapy while we wait to see what the testing shows. 12/27/18 on evaluation today patient presents for follow-up concerning his left gluteal fold wound. Fortunately he appears to be doing well today. I did review the CT scan which was negative for any signs of osteomyelitis or acute abnormality this is excellent news. Overall I feel like the surface of the wound bed appears to be doing significantly better today compared to previously noted findings. There does not appear any signs of infection nor does he have any pain at this time. 01/03/19 on evaluation today patient actually appears to be doing quite well in regard to his ulcer. Post debridement last week he really did not have too much bleeding which is good news. Fortunately today this seems to be doing some better but we still has some of the hyper granular tissue noted in the base of the wound which is gonna require sharp debridement today as well. Overall I'm pleased with how things seem to be progressing since we switched away from the Wound VAC I think he is making some progress. 01/10/19 on evaluation today patient appears to be doing better in regard to his right gluteal fold ulcer. He has been tolerating the  dressing changes without complication. The debridement to seem to be helping with current away some of the poor hyper granular tissue bugs throughout the region of his gluteal fold wound. He's been tolerating the dressing changes otherwise without complication which is great news. No fevers, chills, nausea, or vomiting noted at this time. 01/17/19 on evaluation today patient actually appears to be doing excellent in regard to his wound. He's been tolerating the dressing changes without complication. Fortunately there is no signs of active infection at this time which is great news. No fevers, chills, nausea,  or vomiting noted at this time. 01/24/19 on evaluation today patient actually appears to be doing quite well with regard to his ulcer. He has been tolerating the dressing changes without complication. Fortunately there's no signs of active infection at this time. Overall been very pleased with the progress that he seems to be making currently. 01/31/19-Patient returns at 1 week with apparent similarity in dimensions to the wound, with no signs of infection, he has been changing dressings twice a day 02/08/19 upon evaluation today patient actually appears to be doing well with regard to his right Ischial ulcer. The wound is not appear to be quite as deep and seems to be making progress which is good news. With that being said I'm still reluctant to go back to the Wound VAC at this point. He's been having to change the dressings twice a day which is a little bit much in my pinion from the wound care supplies standpoint. I think that possibly attempting to utilize extras orbit may be beneficial this may also help to prevent any additional breakdown secondary to fluid retention in the wound itself. The patient is in agreement with giving this a try. 02/15/19 on evaluation today patient actually appears to be doing decently well with regard to his ulcer in the right to gluteal fold location. He's  been tolerating the dressing changes without complication. Fortunately there is no signs of active infection at this time. He is able to keep the current dressing in place more effectively for a day at a time whereas before he was having a changes to to three times a day. The actions or has been helpful in this regard. Fortunately there's no signs of anything getting worse and I do feel like he showing signs of good improvement with regard to the wound bed status. 02/22/2019 patient appears to be doing very well today with regard to his ulcer in the gluteal fold. Fortunately there is no signs of active infection and he has been tolerating the dressing changes without any complication. Overall extremely pleased with how things seem to be progressing. He has much less of the hyper granular projections within the wound these have slowly been debrided away and he seems to be doing well. The wound bed is more uniform. 03/01/19 on evaluation today patient appears to be doing unfortunately about the same in regard to his gluteal ulcer. He's been tolerating the dressing changes without complication. Fortunately there's no signs of active infection at this time. With that being said he continues to develop these hyper granular projections which I'm unsure of exactly what they are and why they are rising. Nonetheless I explained to the patient that I do believe it would be a good idea for [place] to stand a biopsy sample for pathology to see if that can shed any light on what exactly may be going on here. Fortunately I do not see any obvious signs of infection. With that being said the patient has had a little bit more drainage this week apparently compared to last week. 03/08/2019 on evaluation today patient actually appears to look somewhat better with regard to the appearance of his wound bed at this time. This is good news. Overall I am very pleased with how things seem to have progressed just in the past week with  a switch to the [facility] dressing. I think that has been beneficial for him. With that being said at this time the patient is concerned about his biopsy that I sent  off last week unfortunately I do not have that report as of yet. Nonetheless we have called to obtain this and hopefully will hear back from the lab later this morning. 03/15/19 on evaluation today patient's wound actually appears to be doing okay today with regard to the overall appearance of the wound bed. He has been tolerating the dressing changes without complication he still has hyper granular tissue noted but fortunately that seems to be minimal at this point compared to some of what we've seen in the past. Nonetheless I do think that he is still having some issues currently with some of this type of granulation the biopsy and since all showed nothing more than just evidence of granulation tissue. Therefore there really is nothing different to initiate or do at this point. 03/24/19 on evaluation today patient appears to be doing a little better with regard to his ulcer. He's been tolerating the dressing changes without complication. Fortunately there is no signs of active infection at this time. No fevers, chills, nausea, or vomiting noted at this time. I'm overall pleased with how things seem to be progressing. 03/29/2019 on evaluation today patient appears to be doing about the same in regard to his ulcer in the right gluteal fold. Unfortunately he is not seeming to make a lot of progress and the wound is somewhat stalled. There is no signs of active infection externally but I am concerned about the possibility of infection continuing in the ischial location which previously he did have infection noted. Again is not able to have an MRI so probably our best option for testing for this would be a triple phase bone scan which will detect subtle changes in the bone more so than plain film x-rays. In the past they really have not been  beneficial and in fact the CT scans even have been somewhat questionable at times. Nonetheless there is no signs of systemic infection [place], Jamen [name]. ([id number]) which is at least good news but again his wound is not healing at all on the predicted schedule. 04/05/19 on evaluation today patient appears to be doing well all things considering with regard to his wound and the right gluteal fold. He actually has his triple bone scan scheduled for sometime in the next couple of days. With that being said I've also been looking to other possibilities of what could be causing hyper granular tissue were looking into the bone scan again for evaluating for the risk or possibility of infection deeper and I'm also gonna go ahead and see about obtaining a deep tissue culture today to send and see if there's any evidence of infection noted on culture. He's in agreement with that plan. 04/12/2019 on evaluation today patient presents for reevaluation here in our clinic concerning ongoing issues with his right gluteal fold ulcer. 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 It was discussed with him at that point that I think it would be appropriate for him to proceed with hyperbaric oxygen therapy especially in light of the fact that he is previously been on IV antibiotics at the beginning of the year for close to 3 months followed by several weeks of oral antibiotics that was all prescribed by infectious disease. He had surgical debridement around Christmas December 2019 due to an abscess and osteomyelitis of the ischial bone. Unfortunately this has not really proceeded as well as we would  have liked and again we did a CT scan even a couple months ago as he cannot have an MRI secondary to having issues with both a pain pump as well as a spinal cord stimulator which prevent him from going into an  MRI machine. With that being said there were chronic changes noted at the ischial tuberosity which had progressed since December 2019 there was no evidence of fluid collection on that initial CT scan. With that being said that was on January 21, 2019. I am not sure that it was a sensitive enough test as compared to an MRI and then subsequently I ordered a bone scan for him which was actually completed on 03/29/2019 and this revealed that he does indeed have positive osteomyelitis involving the right ischial tuberosity. This is adjacent to the ulcer and I think is the reason that his ulcer is not healing. Subsequently I am in a place him back on oral antibiotics today unfortunately his wound culture showed just mixed gram-negative flora with no specific findings of a predominant organism. Nonetheless being with the location I think a good broad-spectrum antibiotic for gram-negative's is a good choice at this point he is previously taken Levaquin without significant issues and I think that is an appropriate antibiotic for him at this time. 04/19/2019 on evaluation today patient actually appears to be doing fairly well with regard to his wound. He has been tolerating the dressing changes without complication. Fortunately there is no signs of active infection and he has been taking the oral antibiotics at this time. Subsequently we did make a referral to infectious disease although Dr. Steva Ready wants the patient to be seen by general surgery first in order apparently to see if there is anything from a surgical standpoint that should be done prior to initiation of IV antibiotic therapy. Again the patient is okay with actually seeing Dr. Celine Ahr whom he has seen before we will make that referral. 04/26/2019 on evaluation today patient actually appears to be doing well with regard to his wound. He has been tolerating the dressing changes without complication. Fortunately there is no signs of active infection at this  time. I do believe that the hyperbaric oxygen therapy along with the antibiotics which I prescribed at this point have been doing well for him. With that being said he has not seen Dr. Celine Ahr the surgeon yet in fact they have not been contacted for scheduling an appointment as of yet. Subsequently the patient is not on antibiotics currently by IV Dr. Steva Ready did want him to see Dr. Celine Ahr first which we are working on trying to get scheduled. We again give the information to the patient today for Dr. Celine Ahr and her number as far as contacting their office to see about getting something scheduled. Again were looking for whether or not they recommend any surgical intervention. 05/03/2019 on evaluation today patient appears to be doing about the same with regard to his wound there may be a little bit of filling in in regard to the base of the wound but still he has quite a bit of hyper granular tissue that I am seeing today. I believe that he may may need a more aggressive sharp debridement possibly even by the surgeon or under anesthesia in order to clear away some of the hyper granular material in order to help allow for appropriate granulation to fill in. We have made a referral to Dr. Celine Ahr unfortunately it sounds as if they did not receive the referral  we contacted them today they should be get in touch with the patient. Depending on how things show from that standpoint the patient may need to see Dr. Ardyth Gal who is the infectious disease specialist although he really does not want a PICC line again. No fevers, chills, nausea, vomiting, or diarrhea. He is tolerating hyperbaric oxygen therapy very well. 05/12/2019 on evaluation today patient presents for follow-up visit concerning his right gluteal fold ulcer. He did see Dr. Celine Ahr the surgeon who previously evaluated his wound and she actually felt like he was doing quite well with regard to the wound based on what she was seen. He does  seem to be responding to some degree with the oral antibiotics along with hyperbaric oxygen therapy at this point she did not see any evidence or need for further surgical intervention at this point. She recommended deferring additional or ongoing antibiotic therapy to Dr. Steva Ready at her discretion. Fortunately there is no signs of active systemic infection. No fevers, chills, nausea, vomiting, or diarrhea. 10/29; patient I am seeing really for the first time today in terms of his wound. 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 He is being treated with hyperbaric oxygen for underlying osteomyelitis most recently documented by a three-phase bone scan. He has been on Levaquin for roughly a month and he is out of these and asked me for a refill. Most recent cultures were negative although I do not think these were bone cultures. He has a very irregular surface to this wound which is almost nodular in texture. It undermines superiorly with the same nodular hyper granulated surface. I see that he was referred to general surgery for an operative debridement although the surgeon did not agree with this I think that would have been the proper course of action in this. He has been using Hydrofera Blue. He is supposed to start a wound VAC tomorrow which is actually a reapplication of wound VAC. I think mostly last time they had trouble keeping the seal in place I hope we have better look at this this time. 05/24/2019 on evaluation today I did review patient's note from Dr. Dellia Nims where he was seen last week when I was out of the office. Subsequently it does appear that Dr. Dellia Nims was in agreement that the patient really needed debridement to clear away some of the nodular tissue in the base of the wound. The good news is he has the wound VAC initiated and some of this tissue is already starting to breakdown under the treatment of the wound VAC again because it is not really viable.  Nonetheless I do believe that we can likely perform some debridement today to clear this away and hopefully the continuation of the wound VAC along with hyperbarics and antibiotic therapy will be beneficial in stopping this from reoccurring. If we get better control in the wound bed in a good spot I think we can definitely use the PriMatrix which we actually did get approval for. 05/31/2019 on evaluation today patient appears to be doing actually pretty well at this point with regard to his wound. He has been tolerating the dressing changes which is good news. Fortunately there is no signs of infection. He still has some of the hyper granular nodules noted we have not really cleared all these away and I think we can try to do a little bit more that today. I did do quite a bit last week I am hoping to get  down to the base of the wound so though actually be able to heal more appropriately. I think the wound VAC is doing a good job right now with controlling the drainage and overall the patient is happy with that. 06/07/2019 upon evaluation today patient appears to be doing quite well compared to last week with regard to the hyper granulation in the base of the [place], Emile [name]. ([id number]) wound. He has been tolerating the dressing changes without complication. Fortunately there is no signs of active infection at this time. With that being said we have not had any recent measures as far as blood work is concerned I think we may want to check a few things including a CBC, sed rate, and C-reactive protein. Patient is in agreement with that we can try to get that scheduled for him for this Friday. 06/13/2019 on evaluation today patient actually appears to be doing much better at this point based on what I am seeing with regard to his wound. He has much less in the way of hyper granular tissue which is good news and a lot more healing that is taken place since I last saw him as far as the amount of space  within the wound itself. With that being said he is nearing the completion of the initial 40 treatments for hyperbaric oxygen therapy I think this is something I would recommend extending as he does seem to be benefiting at this time. Fortunately there is no evidence of active systemic infection at this point nor even local infection for that matter. 06/20/2019 upon evaluation today patient actually appears to be doing well with regard to his wound. In fact this appears to be doing much better today compared to what it was previous. I been extremely pleased with the progress that he is made. In fact the tunnel is much less deep today than what it was in the past and I am seeing excellent signs of improvement. Overall I am happy with how things are going. I did review his white blood cell count which revealed everything to be okay. The one abnormal finding on his CBC was a hemoglobin of 12.7. Subsequently I did also review his sed rate and C-reactive protein. His sed rate measured in at normal level measuring at 26. The C-reactive protein however was elevated today and an abnormal range indicating inflammation. Still I feel like he is trending in a good direction at this time. He is going to need a refill of the Levaquin he tells me at this point. 07/04/2019 on evaluation today patient actually appears to be doing well in regard to his wound. He has been tolerating the dressing changes without complication. Fortunately there is no signs of active infection which is good news. No fevers, chills, nausea, vomiting, or diarrhea. The good news also is that I did review his labs and though his sed rate was slightly elevated the C-reactive protein was actually down compared to the last evaluation 2 weeks ago this is excellent news I think it is showing signs of improvement. 07/11/2019 on evaluation today patient actually appears to be doing quite well with regard to his wound on my evaluation. This is very  slow going but nonetheless he seems to be making progress. Especially in regard to the depth. Nonetheless I believe that we may want to consider going forward with the PriMatrix which was previously approved for him. We will get a have to get a new approval as we will actually be applying  this in the new year so we will likely obtain that approval on January 4. In the meantime and for the time being my suggestion is going to be that we go ahead and continue with the wound VAC and we were going at this point with the current wound care measures. 12/28-Patient seen after HBO session today, wound depth with the tunnel measures 3.2 cm, the wound bed appears healthy, continuing with the VAC and HBO 07/25/2019 on evaluation today patient's wound appears to be doing about the same compared to my last evaluation. Fortunately there is no evidence of significant infection which is good news we will still waiting on the results of his lab work which unfortunately was not done prior to today's visit which is what we were aiming for. Ultimately there is no sign of active infection at this time which is good news. And he also tells me that the wound VAC has been staying in place and doing fairly well which is also good news. With that being said I am concerned about the fact that the wound has not progressed as much I think we may want to look into getting reapproval for the skin substitute, specifically PriMatrix, that I am hopeful will help to allow this area to granulate in more effectively. I would recommend as well continuing with the wound VAC along with this. 08/01/2019 on evaluation today patient appears to be doing in my opinion slightly better in regard to the wound. He actually does have some tightening of the walls around the edges which is a good thing. Fortunately there is no signs of active infection at this time. No fever chills noted. I do believe that the patient in general seems to be making  progress although is very slow. Obviously were trying to get things to speed up somewhat more. No fevers, chills, nausea, vomiting, or diarrhea. 08/08/2019 upon evaluation today patient appears to be doing well with regard to his wound although there is not a significant improvement overall we do have the PriMatrix for availability today to apply. Fortunately there is no signs of active infection and the patient overall seems to be doing well. There is really no need for aggressive sharp debridement at this point either which is also good news. I do believe her get a need to suture and the primary checks in the basin in the PriMatrix in order to keep it in place at the base of the wound underneath the wound VAC. This was discussed with the patient today as well. He is in agreement the plan. He really does not have any filling in the area and so this should not be a complication as far as suturing is concerned. 08/15/2019 on evaluation today patient appears to be doing somewhat better in my opinion in regard to the overall appearance of his wound. He has been tolerating the dressing changes without complication. Fortunately the skin subappear to still be in place when this was changed on Friday. With that being said it appears that the PriMatrix may have dissolved in the interim between now and then as the sutures are no longer holding anything in place and again I would sutured it into place. Nonetheless I believe that we can go ahead and likely consider going forward that we may need to apply the PriMatrix weekly as opposed every other week as it does seem to have dissolved rather readily and well. Overall the wound bed appears to be doing excellent at this time. 08/22/2019 upon  evaluation today the patient does seem to making some progress with regard to his wound. Overall there is some better quality tissue and less of the hyper granular protrusions all of which is good news. With that being said we do  have the PriMatrix available for application today and I am in a try to find a better way to suture this in place for him today. 08/29/2019 on evaluation today patient appears to be doing very well in regard to his wound. I do see signs of new granulation which seems to be somewhat helpful he did have some need for sharp debridement today to clear some of the hyper granulation away but nonetheless I feel like we are headed in a good direction. Fortunately there is no evidence of any systemic infection although we are going to go ahead and reorder the CBC, C-reactive protein, and sed rate today to see where things stand I did just refill his Levaquin as well. 09/05/2019 upon evaluation today patient's wound actually is not appearing to be showing signs of significant improvement compared to what we have seen previous from the standpoint of depth. He overall wound size wise seems to be about the same and again some of the quality of the wound bed is still doing fairly well. I did review his labs and his CBC was normal with no signs of elevation white blood cell count, his C-reactive protein was normal, and the sed rate was slightly elevated but again I think this may just be due to ulceration really there is nothing significant to indicate severe infection. I think when he likely finishes his current round of oral antibiotics with Levaquin will likely take him off of the medication at that point based on the [place], RUSTY AMPARANO. ([id number]) findings. I do feel like the hyperbarics helped in getting the infection under control as well as improving the overall size of the wound quite significantly. With that being said at this point I really feel like more more at the time frame that he may benefit from a muscle flap in order to try to help this wound heal effectively. I think that this would be the fastest way in order to do this. In the past he has been somewhat resistant to going down that path  but nonetheless I feel like he likely should. 09/12/2019 on evaluation today patient appears to be doing fairly well in regard to his wound. He is tolerating the wound VAC without complication is still continues to drain significantly. Fortunately there is no signs of active infection at this time which is great news his lab work is also been doing very well. With that being said I do think at this point that the patient is still deriving benefit from the wound VAC especially in regard to controlling moisture which I think it is doing fairly well. He also has some improvement in the overall wound bed and on the medial aspect of the wound there are some hyper granular projections but nothing as severe as what they have been in the past. I am good have to perform some sharp debridement at this location today. 09/19/2019 upon evaluation today patient appears to be doing roughly the same although again the tissue does seem to be showing some signs of improvement which is good news. There is less hyper granular budding. Nonetheless he still is going require some sharp debridement today. We did get his albumin and prealbumin results back and they are within normal limits in both  cases which is excellent news that was the one thing [facility] plastic surgery was waiting on order to get him scheduled. He does seem to be a good candidate physically for the flap surgery if he decides to go that direction. With that being said is still left to be determined once they see the wound what they feel about how best to proceed. Obviously in the end it would be up to Mr. Gartrell as to whether he goes forward with the flap surgery or something else depending on the recommendations. 09/26/2019 upon evaluation today patient appears to be doing about the same in regard to his wound. Fortunately there is no signs of active infection there is also not as much hyper granulation noted today in fact I do not believe there is can be any  sharp debridement required at this point. With that being said I do believe he continues to be somewhat stalled at this point with regard to his wound. There is no signs of active infection at this time which is good news. He does have an appointment with [facility] plastic surgery on Friday. 10/03/2019 upon evaluation today patient appears to be doing somewhat poorly especially in his overall demeanor compared to last week's evaluation. He unfortunately did see the physician at [facility] plastic surgery. Unfortunately the plan for the possibility of a flap would be a fairly significant surgery that the patient is really not wanting to take the risk of as if the surgery failed he would end up with a very large open wound at that point. Fortunately there is no signs of active infection at this time. No fevers, chills, nausea, vomiting, or diarrhea. With that being said there was some question that the physician had about why this would not be healing and the possibility that there may be some bone still needs to be removed deeper in the wound. Again the bone scan that we did previous actually did show evidence of there still being a chronic osteomyelitis back on March 29, 2019. With that being said he did undergo treatment both with hyperbarics as well as oral antibiotics for quite some time since that point. Obviously he may need additional and updated imaging with the surgeon in order to see if there is anything else that can or will need to be done in order to help this area to heal. Obviously I still believe as well that there is something going on here. He is already seeing the surgeon that performed the original surgery after the bone scan but they did not feel that anything needed to be done from a surgical standpoint at that time. 10/10/2019 upon evaluation today patient appears to be really doing about the same with regard to his wound in the gluteal fold region. Unfortunately he is not making a lot of  improvement the insurance also contacted home health and apparently the wound VAC is no longer to be covered due to lack of progress. Obviously that is something that we discussed as well as a strong possibility for happening soon and this was even last week that discussed this with him. With that being said again we have tried to manage this wound as best as I could up to this point. Fortunately there is no evidence of active infection at this time. No fevers, chills, nausea, vomiting, or diarrhea. 10/17/2019 upon evaluation today patient has decided to proceed with the flap surgery. He actually seems to be in better spirits today he states he wants to get  this healed and after talking to the surgeon last week he feels like that is his best option for doing so. With that being said he also needs to have a referral to infectious disease to be seen for IV antibiotics preoperatively and postoperatively as far as the skin flap is concerned. He wonders if Dr. Steva Ready would be interested or available to do this for him. I explained that I would definitely have to check with her and I will be happy to do that but I am not certain What her answer will be in 2 I have a discussion with her. 10/24/2019 upon evaluation today patient appears to be doing about the same with regard to his wound although he is complaining of increased pain he is really not had any discomfort he states that it is getting much worse at this point. Fortunately there is no signs of active infection. At least not systemically. With that being said I question whether local infection could be a problem here secondary to the fact that to be honest the patient is having pain where he has not previous. 10/31/2019 upon evaluation today patient appears to be doing about the same in regard to his wound in the right gluteal fold region. Subsequently in the interim since I last saw him last week I did receive a message from Dr. Steva Ready as well as  reviewed her note. She was requesting the potential for a bone culture or at least a deep tissue culture in order to see about helping with more directed antibiotic therapy for the patient's projected flap surgery. If we could not obtain more specific organisms for treatment then obviously should be looking at more broad-spectrum antibiotic therapy. With that being said at this point the patient seems ready to move forward with the flap surgery. I did actually sign the paperwork for his bed which came through last week I believe that was on Thursday or Friday. Either way that should be coming to him as well so he will have that in place once the surgery occurs when he is discharged home. Fortunately there is no signs of active infection at this time. No fevers, chills, nausea, vomiting, or diarrhea. Objective Constitutional KOVEN, WOLFGRAMM. ([id number]) Well-nourished and well-hydrated in no acute distress. Vitals Time Taken: 8:11 AM, Height: 73 in, Weight: 210 lbs, BMI: 27.7, Temperature: 97.8 F, Pulse: 87 bpm, Respiratory Rate: 16 breaths/min, Blood Pressure: 137/74 mmHg. Respiratory normal breathing without difficulty. Psychiatric this patient is able to make decisions and demonstrates good insight into disease process. Alert and Oriented x 3. pleasant and cooperative. General Notes: Upon evaluation patient's wound bed actually showed signs of hyper granulation which is part of what we have been fighting up to this point. Fortunately there is no signs of active infection at this time which is great news. No fevers, chills, nausea, vomiting, or diarrhea. I did actually obtain a deep tissue culture sample to be sent to the lab for further evaluation and treatment. This was part of a deeper debridement of some of the tissue here today. Integumentary (Hair, Skin) Wound #1 status is Open. Original cause of wound was Pressure Injury. The wound is located on the Right Gluteal fold. The wound  measures 2cm length x 1.5cm width x 1.8cm depth; 2.356cm^2 area and 4.241cm^3 volume. There is Fat Layer (Subcutaneous Tissue) Exposed exposed. There is tunneling at 1:00 with a maximum distance of 4.5cm. There is a large amount of serous drainage noted. The wound margin is epibole.  There is large (67-100%) pink, hyper - granulation within the wound bed. There is a small (1-33%) amount of necrotic tissue within the wound bed including Adherent Slough. 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 There was a Excisional Skin/Subcutaneous Tissue Debridement with a total area of 3 sq cm performed by [name], Markos Theil E., [name]. With the following instrument(s): Forceps, and Scissors to remove Viable and Non-Viable tissue/material. Material removed includes Subcutaneous Tissue after achieving pain control using Lidocaine. 1 specimen was taken by a Tissue Culture and sent to the lab per facility protocol. A time out was conducted at 08:32, prior to the start of the procedure. A Minimum amount of bleeding was controlled with Pressure. The procedure was tolerated well. Post Debridement Measurements: 2cm length x 1.5cm width x 1.8cm depth; 4.241cm^3 volume. Post debridement Stage noted as Category/Stage IV. Character of Wound/Ulcer Post Debridement is stable. Post procedure Diagnosis Wound #1: Same as Pre-Procedure Plan Wound Cleansing: Wound #1 Right Gluteal fold: Clean wound with Normal Saline. - in office Cleanse wound with mild soap and water Anesthetic (add to Medication List): QUINTERIUS, BROGAN ([id number]) Wound #1 Right Gluteal fold: Topical Lidocaine 4% cream  applied to wound bed prior to debridement (In Clinic Only). Skin Barriers/Peri-Wound Care: Skin Prep Primary Wound Dressing: Wound #1 Right Gluteal fold: Silver Alginate Secondary Dressing: Wound #1 Right Gluteal fold: Tegaderm XtraSorb Dressing Change Frequency: Wound #1 Right Gluteal fold: Change dressing every other day. Follow-up Appointments: Wound #1 Right Gluteal fold: Return Appointment in 2 weeks. Off-Loading: Wound #1 Right Gluteal fold: Turn and reposition every 2 hours - no pressure on wounded area Home Health: Wound #1 Right Gluteal fold: Continue Home Health Visits - [place] Nurse may visit PRN to address patient s wound care needs. FACE TO FACE ENCOUNTER: MEDICARE and MEDICAID PATIENTS: I certify that this patient is under my care and that I had a face-to-face encounter that meets the physician face-to-face encounter requirements with this patient on this date. The encounter with the patient was in whole or in part for the following MEDICAL CONDITION: (primary reason for [place]) MEDICAL NECESSITY: I certify, that based on my findings, NURSING services are a medically necessary home health service. HOME BOUND STATUS: I certify that my clinical findings support that this patient is homebound (i.e., Due to illness or injury, pt requires aid of supportive devices such as crutches, cane, wheelchairs, walkers, the use of special transportation or the assistance of another person to leave their place of residence. There is a normal inability to leave the home and doing so requires considerable and taxing effort. Other absences are for medical reasons / religious services and are infrequent or of short duration when for other reasons). If current dressing causes regression in wound condition, may D/C ordered dressing product/s and apply Normal Saline Moist Dressing daily until next [place] / Other MD appointment. [place] of  regression in wound condition at [phone]. Please direct any NON-WOUND related issues/requests for orders to patient's Primary Care Physician Negative Pressure Wound Therapy: Wound #1 Right Gluteal fold: Discontinue NPWT. Laboratory ordered were: Wound culture routine - Tissue culture right glut 1. Again deep tissue culture was obtained and sent for  evaluation at this point. I do believe that the patient likely would benefit from directed antibiotic therapy if indeed we can obtain a specific organism the culture is out of this. 2. I am going to suggest that we continue right now with the alginate dressing otherwise which I feel like is doing about the best for him. With his next resort as well to help control moisture. 3. I would suggest as well that once everything clears and is done the patient proceed with the flap surgery obviously that seems to be his best chance of getting this healed most effectively and quickly. He is in agreement and planning to proceed down that road. 4. I did also fill out the paperwork for his bed/mattress. Hopefully he should be getting that shortly. We will see patient back for reevaluation in 1 week here in the clinic. If anything worsens or changes patient will contact our office for additional recommendations. Electronic Signature(s) Signed: 10/31/2019 10:59:40 AM By: Worthy Keeler [name] Entered By: Worthy Keeler on 10/31/2019 10:59:40 ASIF, HASEK ([id number]) -------------------------------------------------------------------------------- SuperBill Details Patient Name: [name] Date of Service: 10/31/2019 Medical Record Number: [id number] Patient Account Number: [id number] Date of Birth/Sex: 11-21-1950 (69 y.o. 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, sequela I10 Essential (primary) hypertension Facility Procedures CPT4 Code: [id number] Description: [id number] - DEB SUBQ TISSUE 20 SQ CM/< Modifier: Quantity: 1 CPT4 Code: Description: ICD-10 Diagnosis Description L89.314 Pressure ulcer of right buttock, stage 4 Modifier: Quantity: Physician Procedures CPT4 Code: [id number] Description: 11042 - WC PHYS SUBQ TISS 20 SQ CM Modifier: Quantity: 1 CPT4 Code: Description: ICD-10 Diagnosis Description L89.314 Pressure ulcer of right buttock, stage 4 Modifier: Quantity: Electronic Signature(s) Signed: 10/31/2019 11:02:54 AM By: Worthy Keeler [name] Entered By: Worthy Keeler on 10/31/2019 11:02:54

## 2019-11-03 LAB — AEROBIC CULTURE W GRAM STAIN (SUPERFICIAL SPECIMEN)

## 2019-11-03 NOTE — Telephone Encounter (Signed)
I have not ordered for PICC line yet as I am waiting on the culture- so dont nw who ordered it .

## 2019-11-04 NOTE — Telephone Encounter (Signed)
I spoke to the patient and shared the culture result- Told him we will get PICC ordered for next week-  Can you help with that?

## 2019-11-04 NOTE — Telephone Encounter (Signed)
He must have been confused because I got message this morning asking when is he going to have PICC placed?

## 2019-11-07 ENCOUNTER — Encounter: Payer: Medicare Other | Admitting: Physician Assistant

## 2019-11-08 ENCOUNTER — Telehealth: Payer: Self-pay

## 2019-11-08 ENCOUNTER — Ambulatory Visit: Payer: Medicare Other

## 2019-11-08 NOTE — Telephone Encounter (Signed)
Called twice to get PICC placement they are going to have charge nurse call me today from same day. Patient aware we are working on this today.

## 2019-11-09 ENCOUNTER — Ambulatory Visit: Admission: RE | Admit: 2019-11-09 | Payer: Medicare Other | Source: Ambulatory Visit

## 2019-11-09 ENCOUNTER — Encounter: Payer: Self-pay | Admitting: Infectious Diseases

## 2019-11-09 ENCOUNTER — Other Ambulatory Visit: Payer: Self-pay

## 2019-11-09 DIAGNOSIS — M8660 Other chronic osteomyelitis, unspecified site: Secondary | ICD-10-CM

## 2019-11-09 NOTE — Telephone Encounter (Signed)
Scheduled by Jenny Reichmann in Same Day for 11/09/2019 1pm. Patient cancelled and moved to 11/10/19. Picc orders entered and Vancomycin ordered by pharmacy.

## 2019-11-09 NOTE — Progress Notes (Signed)
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 Clindamycin Hives  . Doxycycline Rash  . Penicillins Rash  . Sulfamethoxazole Rash    OPAT Orders Vancomycin 187m Q 12 ( after a loading dose of 1750 mg [facility]  Per pharmacy protocol  Aim for Vancomycin trough 15-20  Duration: 4 weeks End Date: 12/08/19  [facility]  Labs weekly Monday while on IV antibiotics: _X_ CBC with differential  _X_ CMP  X__ Vancomycin trough  Labs weekly on Thursday while on antibioitc Xvanco trough X BMP  CRP/ESR once every 2 weeks on Monday    Please leave PIC in place until doctor has seen patient or been notified  Fax weekly labs to [facility]([phone] Clinic Follow Up Appt:3 weeks  Call [phone] make appt  [facility]

## 2019-11-09 NOTE — Telephone Encounter (Signed)
done

## 2019-11-10 ENCOUNTER — Ambulatory Visit
Admission: RE | Admit: 2019-11-10 | Discharge: 2019-11-10 | Disposition: A | Discharge status: Home / Self Care | Payer: Self-pay | Source: Ambulatory Visit | Attending: Infectious Diseases | Admitting: Infectious Diseases

## 2019-11-10 ENCOUNTER — Ambulatory Visit
Admission: RE | Admit: 2019-11-10 | Discharge: 2019-11-10 | Disposition: A | Discharge status: Home / Self Care | Payer: Medicare Other | Source: Ambulatory Visit | Attending: Infectious Diseases | Admitting: Infectious Diseases

## 2019-11-10 ENCOUNTER — Other Ambulatory Visit: Payer: Self-pay

## 2019-11-10 DIAGNOSIS — M8660 Other chronic osteomyelitis, unspecified site: Secondary | ICD-10-CM | POA: Diagnosis present

## 2019-11-10 MED ORDER — HEPARIN SOD (PORK) LOCK FLUSH 100 UNIT/ML IV SOLN
250.0000 [IU] | INTRAVENOUS | Status: AC | PRN
  Administered 2019-11-10: 250 [IU]

## 2019-11-10 MED ORDER — VANCOMYCIN HCL 1750 MG/350ML IV SOLN
1750.0000 mg | Freq: Once | INTRAVENOUS | Status: AC
  Administered 2019-11-10: 1750 mg via INTRAVENOUS
  Filled 2019-11-10: qty 350

## 2019-11-10 MED ORDER — HEPARIN SOD (PORK) LOCK FLUSH 100 UNIT/ML IV SOLN
INTRAVENOUS | Status: AC
  Filled 2019-11-10: qty 5

## 2019-11-10 MED ORDER — CHLORHEXIDINE GLUCONATE CLOTH 2 % EX PADS: 6.0000 | MEDICATED_PAD | Freq: Every day | CUTANEOUS | Status: DC

## 2019-11-10 MED ORDER — SODIUM CHLORIDE 0.9% FLUSH
10.0000 mL | Freq: Two times a day (BID) | INTRAVENOUS | Status: DC
  Administered 2019-11-10: 15:00:00 10 mL

## 2019-11-10 MED ORDER — SODIUM CHLORIDE 0.9% FLUSH: 10.0000 mL | INTRAVENOUS | Status: DC | PRN

## 2019-11-10 NOTE — Progress Notes (Signed)
Peripherally Inserted Central Catheter Placement  The IV Nurse has discussed with the patient and/or persons authorized to consent for the patient, the purpose of this procedure and the potential benefits and risks involved with this procedure.  The benefits include less needle sticks, lab draws from the catheter, and the patient may be discharged home with the catheter. Risks include, but not limited to, infection, bleeding, blood clot (thrombus formation), and puncture of an artery; nerve damage and irregular heartbeat and possibility to perform a PICC exchange if needed/ordered by physician.  Alternatives to this procedure were also discussed.  Bard Power PICC patient education guide, fact sheet on infection prevention and patient information card has been provided to patient /or left at bedside.    PICC Placement Documentation  PICC Single Lumen 11/10/19 Right Brachial 43 cm 0 cm (Active)  Indication for Insertion or Continuance of Line Home intravenous therapies (PICC only) 11/10/19 1403  Exposed Catheter (cm) 0 cm 11/10/19 1403  Site Assessment Clean;Dry;Intact 11/10/19 1403  Line Status Flushed;Blood return noted 11/10/19 1403  Dressing Type Transparent 11/10/19 1403  Dressing Status Clean;Dry;Intact;Antimicrobial disc in place;Other (Comment) 11/10/19 1403  Dressing Intervention New dressing 11/10/19 1403  Dressing Change Due 11/17/19 11/10/19 1403       [name] 11/10/2019, 2:04 PM

## 2019-11-14 ENCOUNTER — Encounter: Payer: Medicare Other | Admitting: Physician Assistant

## 2019-11-15 ENCOUNTER — Other Ambulatory Visit: Payer: Self-pay

## 2019-11-15 ENCOUNTER — Encounter: Payer: Medicare Other | Admitting: Physician Assistant

## 2019-11-15 DIAGNOSIS — L89314 Pressure ulcer of right buttock, stage 4: Secondary | ICD-10-CM | POA: Diagnosis not present

## 2019-11-16 NOTE — Progress Notes (Addendum)
ROZELLE, YUILL ([id number]) Visit Report for 11/15/2019 Chief Complaint Document Details Patient Name: [name] Date of Service: 11/15/2019 3:30 PM Medical Record Number: [id number] Patient Account Number: [id number] Date of Birth/Sex: May 05, 1951 (69 y.o. M) Treating RN: Montey Hora Primary Care Provider: [name] Other Clinician: Referring Provider: [name] Treating Provider/Extender: [name], Zaelynn Fuchs Weeks in Treatment: 102 Information Obtained from: Patient Chief Complaint Right gluteal fold ulcer Electronic Signature(s) Signed: 11/15/2019 6:04:24 PM By: Worthy Keeler [name] Entered By: Worthy Keeler on 11/15/2019 18:04:23 BRYSON, SAMPSELL ([id number]) -------------------------------------------------------------------------------- HPI Details Patient Name: [name] Date of Service: 11/15/2019 3:30 PM Medical Record Number: [id number] Patient Account Number: [id number] Date of Birth/Sex: 03-18-1951 (69 y.o. 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 He states that there is no history of diabetes that he is aware of. He does have issues with his prostate and is currently receiving treatment for this by way of oral medication. With that being said I do not have a lot of details in that regard. Nonetheless the patient presents today as a result of having been referred to [place] by another provider initially home health was set to come out and take care of his wound although due to the fact that  he apparently drives he's not able to receive home health. His wife is therefore trying to help take care of this wound within although they have been struggling with what exactly to do at this point. She states that she can do some things but she is definitely not a nurse and does have some issues with looking at blood. The good news is the wound does not appear to be too deep and is fairly superficial at this point. There is no slough noted there is some nonviable skin noted around the surface of the wound and the perimeter at this point. The central portion of the wound appears to be very good with a dermal layer noted this does not appear to be again deep enough to extend it to subcutaneous tissue at this point. 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 In the pump he has Dilaudid and baclofen. 12/07/17 on evaluation today patient presents for follow-up concerning his ongoing lower back thigh ulcer on the right. He states that he did not get the supplies ordered and therefore has not really been able to perform the dressing changes as directed exactly. His wife was able to get some Boarder Foam Dressing's from the drugstore and subsequently has been using hydrogel which did help to a degree in the wound does appear to be able smaller. There is actually more drainage this week noted than previous. 12/21/17 on evaluation today patient appears to be doing rather well in regard to his right gluteal ulcer. He has been tolerating the dressing changes without complication. There does not appear to be any evidence of infection at this point in time. Overall the wound does seem to be making some progress as far as the edges  are concerned there's not as much in the way of overlapping of the external wound edges and he has a good epithelium to wound bed border for the most part. This however is not true  right at the 12 o'clock location over the span of a little over a centimeters which actually will require debridement today to clean this away and hopefully allow it to continue to heal more appropriately. 12/28/17 on evaluation today patient appears to be doing rather well in regard to his ulcer in the left gluteal region. He's been tolerating the dressing changes without complication. Apparently he has had some difficulty getting his dressing material. Apparently there's been some confusion with ordering we're gonna check into this. Nonetheless overall he's been showing signs of improvement which is good news. Debridement is not required today. 01/04/18 on evaluation today patient presents for follow-up concerning his right gluteal ulcer. He has been tolerating the dressing changes fairly well. On inspection today it appears he may actually have some maceration them concerned about the fact that he may be developing too much moisture in and around the wound bed which can cause delay in healing. With that being said he unfortunately really has not showed significant signs of improvement since last week's evaluation in fact this may even be just the little bit/slightly larger. Nonetheless he's been having a lot of discomfort I'm not sure this is even related to the wound as he has no pain when I'm to breeding or otherwise cleaning the wound during evaluation today. Nonetheless this is something that we did recommend he talked to his pain specialist concerning. 01/11/18 on evaluation today patient appears to be doing better in regard to his ulceration. He has been tolerating the dressing changes without complication. With that being said overall there's no evidence of infection which is good news. The only thing is he did receive the hatch affair blue classic versus the ready nonetheless I feel like this is perfectly fine and appears to have done well for him over the past week. 01/25/18 on evaluation today  patient's wound actually appears to be a little bit larger than during the last evaluation. The good news is the majority of the wound edges actually appear to be fairly firmly attached to the wound bed unfortunately again we're not really making progress in regard to the size. Roughly the wound is about the same size as when I first saw him although again the wound margin/edges appear to be much better. 02/01/18 on evaluation today patient actually appears to be doing very well in regard to his wound. Applying the Prisma dry does seem to be better although he does still have issues with slow progression of the wound. There was a slight improvement compared to last week's measurements today. Nonetheless I have been considering other options as far as the possibility of Theraskin or even a snap vac. In general I'm not sure that the Theraskin due to location of the wound would be a very good idea. Nonetheless I do think that a snap vac could be a possibility for the patient and in fact I think this could even be an excellent way to manage the wound possibly seeing some improvement in a very rapid fashion here. Nonetheless this is something that we would need to get approved and I did have a lengthy conversation with the patient about this today. 02/08/18 on evaluation today patient appears to be doing a little better in regard to his ulcer. He has been tolerating  the dressing changes without complication. Fortunately despite the fact that the wound is a little bit smaller it's not significantly so unfortunately. We have discussed the possibility of a snap vac we did check with insurance this is actually covered at this point. Fortunately there does not appear to be any sign of infection. Overall I'm fairly pleased with how things seem to be appearing at this point. 02/15/18 on evaluation today patient appears to be doing rather well in regard to his right gluteal ulcer. Unfortunately the snap vac did not  stay in place with his sheer and friction this came loose and did not seem to maintain seal very well. He worked for about two days and it did seem to do very well during that time according to his wife but in general this does not seem to be something that's gonna be beneficial for him long-term. I do believe we need to go back to standard dressings to see if we can find something that will be of benefit. 03/02/18- He is here in follow up evaluation; there is minimal change in the wound. He will continue with the same treatment plan, would consider changing to iodosrob/iodoflex if ulcer continues to to plateau. He will follow up next week WAYLON, HERSHEY ([id number]) 03/08/18 on evaluation today patient's wound actually appears to be about the same size as when I previously saw him several weeks back. Unfortunately he does have some slightly dark discoloration in the central portion of the wound which has me concerned about pressure injury. I do believe he may be sitting for too long a period of time in fact he tells me that "I probably sit for much too long". He does have some Slough noted on the surface of the wound and again as far as the size of the wound is concerned I'm really not seeing anything that seems to have improved significantly. 03/15/18 on evaluation today patient appears to be doing fairly well in regard to his ulcer. The wound measured pretty much about the same today compared to last week's evaluation when looking at his graph. With that being said the area of bruising/deep tissue injury that was noted last week I do not see at this point. He did get a new cushion fortunately this does seem to be have been of benefit in my pinion. It does appear that he's been off of this more which is good news as well I think that is definitely showing in the overall wound measurements. With that being said I do believe that he needs to continue to offload I don't think that the fact this is  doing better should be or is going to allow him to not have to offload and explain this to him as well. Overall he seems to be in agreement the plan I think he understands. The overall appearance of the wound bed is improved compared to last week I think the Iodoflex has been beneficial in that regard. 03/29/18 on evaluation today patient actually appears to be doing rather well in regard to his wound from the overall appearance standpoint he does have some granulation although there's some Slough on the surface of the wound noted as well. With that being said he unfortunately has not improved in regard to the overall measurement of the wound in volume or in size. I did have a discussion with him very specifically about offloading today. He actually does work although he mainly is just sitting throughout the day. He tells me  he offloads by "lifting himself up for 30 seconds off of his chair occasionally" purchase from [employer] which does seem to have helped. And he has a new cushion that he with that being said he's also able to stand some for a very short period of time but not significant enough I think to provide appropriate offloading. I think the biggest issue at this point with the wound and the fact is not healing as quickly as we would like is due to the fact that he is really not able to appropriately offload while at work. He states the beginning after his injury he actually had a bed at his job that he could lay on in order to offload and that does seem to have been of help back at that time. Nonetheless he had not done this in quite some time unfortunately. I think that could be helpful for him this is something I would like for him to look into. 04/05/18 on evaluation today patient actually presents for follow-up concerning his right gluteal ulcer. Again he really is not significantly improved even compared to last week. He has been tolerating the dressing changes without  complication. With that being said fortunately there appears to be no evidence of infection at this time. He has been more proactive in trying to offload. 04/12/18 on evaluation today patient actually appears to be doing a little better in regard to his wound and the right gluteal fold region. He's been tolerating the dressing changes since removing the oasis without complication. However he was having a lot of burning initially with the oasis in place. He's unsure of exactly why this was given so much discomfort but he assumes that it was the oasis itself causing the problem. Nonetheless this had to be removed after about three days in place although even those three days seem to have made a fairly good improvement in regard to the overall appearance of the wound bed. In fact is the first time that he's made any improvement from the standpoint of measurements in about six weeks. He continues to have no discomfort over the area of the wound itself which leads me to wonder why he was having the burning with the oasis when he does not even feel the actual debridement's themselves. I am somewhat perplexed by this. 04/19/18 on evaluation today patient's wound actually appears to be showing signs of epithelialization around the edge of the wound and in general actually appears to be doing better which is good news. He did have the same burning after about three days with applying the Endoform last week in the same fashion that I would generally apply a skin substitute. This seems to indicate that it's not the oasis to cause the problem but potentially the moisture buildup that just causes things to burn or there may be some other reaction with the skin prep or Steri-Strips. Nonetheless I'm not sure that is gonna be able to tolerate any skin substitute for a long period of time. The good news is the wound actually appears to be doing better today compared to last week and does seem to finally be making some  progress. 04/26/18 on evaluation today patient actually appears to be doing rather well in regard to his ulcer in the right gluteal fold. He has been tolerating the dressing changes without complication which is good news. The Endoform does seem to be helping although he was a little bit more macerated this week. This seems to be an  ongoing issue with fluid control at this point. Nonetheless I think we may be able to add something like Drawtex to help control the drainage. 05/03/18 on evaluation today patient appears to actually be doing better in regard to the overall appearance of his wound. He has been tolerating the dressing changes without complication. Fortunately there appears to be no evidence of infection at this time. I really feel like his wound has shown signs as of today of turning around last week I thought so as well and definitely he could be seen in this week's overall appearance and measurements. In general I'm very pleased with the fact that he finally seems to be making a steady but sure progress. The patient likewise is very pleased. 05/17/18 on evaluation today patient appears to be doing more poorly unfortunately in regard to his ulcer. He has been tolerating the dressing changes without complication. With that being said he tells me that in the past couple of days he and his wife have noticed that we did not seem to be doing quite as well is getting dark near the center. Subsequently upon evaluation today the wound actually does appear to be doing worse compared to previous. He has been tolerating the dressing changes otherwise and he states that he is not been sitting up anymore than he was in the past from what he tells me. Still he has continued to work he states "I'm tired of dealing with this and if I have to just go home and lay in the bed all the time that's what I'll do". Nonetheless I am concerned about the fact that this wound does appear to be deeper than what it  was previous. 05/24/18 upon evaluation today patient actually presents after having been in the hospital due to what was presumed to be sepsis secondary to the wound infection. He had an elevated white blood cell count between 14 and 15. With that being said he does seem to be doing somewhat better now. His wound still is giving him some trouble nonetheless and he is obviously concerned about the fact likely talked about that this does seem to go more deeply than previously noted. I did review his wound culture which showed evidence of Staphylococcus aureus him and group B strep. Nonetheless he is on antibiotics, Levaquin, for this. Subsequently I did review his intake summary from the hospital as well. I also did look at the CT of the lumbar spine with contrast that was performed which showed no bone destruction to suggest lumbar disguises/osteomyelitis or sacral osteomyelitis. There was no paraspinal abscess. Nonetheless it appears this may have been more of just a soft tissue infection at this point which is good news. He still is nonetheless concerned about the wound which again I think is completely reasonable considering everything he's been through recently. 05/31/18 on evaluation today on evaluation today patient actually appears to be showing signs of his wound be a little bit deeper than what I would like to see. Fortunately he does not show any signs of significant infection although his temperature was 99 today he states he's been checking this at home and has not been elevated. Nonetheless with the undermining that I'm seeing at this point I am becoming more concerned about the wound I do think that offloading is a key factor here that is preventing the speedy recovery at this point. There does not appear to be any evidence of again over infection noted. He's been using Santyl currently. 06/07/18 the  patient presents today for follow-up evaluation regarding the left ulcer in the gluteal  region. He has been tolerating the Wound VAC Gatt, Kaenan J. ([id number]) fairly well. He is obviously very frustrated with this he states that to mean is really getting in his way. There does not appear to be any evidence of infection at this time he does have a little bit of odor I do not necessarily associate this with infection just something that we sometimes notice with Wound VAC therapy. With that being said I can definitely catch a tone of discontentment overall in the patient's demeanor today. This when he was previously in the hospital an CT scan was done of the lumbar region which did not reveal any signs of osteomyelitis. With that being said the pelvis in particular was not evaluated distinctly which means he could still have some osteonecrosis I. Nonetheless the Wound VAC was started on Thursday I do want to get this little bit more time before jumping to a CT scan of the pelvis although that is something that I might would recommend if were not see an improvement by that time. 06/14/18 on evaluation today patient actually appears to be doing about the same in regard to his right gluteal ulcer. Again he did have a CT scan of the lumbar spine unfortunately this did not include the pelvis. Nonetheless with the depth of the wound that I'm seeing today even despite the fact that I'm not seeing any evidence of overt cellulitis I believe there's a good chance that we may be dealing with osteomyelitis somewhere in the right Ischial region. No fevers, chills, nausea, or vomiting noted at this time. 06/21/18 on evaluation today patient actually appears to be doing about the same with regard to his wound. The tunnel at 6 o'clock really does not appear to be any deeper although it is a little bit wider. I think at this point you may want to start packing this with white phone. Unfortunately I have not got approval for the CT scan of the pelvis as of yet due to the fact that Medicare apparently  has been denied it due to the diagnosis codes not being appropriate according to Medicare for the test requested. With that being said the patient cannot have an MRI and therefore this is the only option that we have as far as testing is concerned. The patient has had infection and was on antibiotics and been added code for cellulitis of the bottom to see if this will be appropriate for getting the test approved. Nonetheless I'm concerned about the infection have been spread deeper into the Ischial region. 06/28/18 on evaluation today patient actually appears to be doing rather well all things considered in regard to the right gluteal ulcer. He has been tolerating the dressing changes without complication. With that being said the Wound VAC he states does have to be replaced almost every day or at least reinforced unfortunately. Patient actually has his CT scan later this morning we should have the results by tomorrow. 07/05/18 on evaluation today patient presents for follow-up concerning his right Ischial ulcer. He did see the surgeon Dr. Lysle Pearl last week. They were actually very happy with him and felt like he spent a tremendous amount of time with them as far as discussing his situation was concerned. In the end Dr. Lysle Pearl did contact me as well and determine that he would not recommend any surgical intervention at this point as he felt like it would not be  in the patient's best interest based on what he was seeing. He recommended a referral to infectious disease. Subsequently this is something that Dr. Ines Bloomer office is working on setting up for the patient. As far as evaluation today is concerned the patient's wound actually appears to be worse at this point. I am concerned about how things are progressing and specifically about infection. I do not feel like it's the deeper but the area of depth is definitely widening which does have me concerned. No fevers, chills, nausea, or vomiting noted at this  time. I think that we do need initiate antibiotic therapy the patient has an allow allergy to amoxicillin/penicillin he states that he gets a rash since childhood. Nonetheless she's never had the issues with Catholics or cephalosporins in general but he is aware of. 07/27/18 on evaluation today patient presents following admission to the hospital on 07/09/18. He was subsequently discharged on 07/20/18. On 07/15/18 the patient underwent irrigation and debridement was soft tissue biopsy and bone biopsy as well as placement of a Wound VAC in the OR by Dr. Celine Ahr. During the hospital course the patient was placed on a Wound VAC and recommended follow up with surgery in three weeks actually with Dr. Delaine Lame who is infectious disease. The patient was on vancomycin during the hospital course. 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 He also had a bone culture which revealed evidence of methicillin-resistant staph aureus. He is updated CT scan 07/09/18 reveals that he had progression of the which was performed on wound to breakdown down to the trochanter where he actually had irregularities there as well suggestive of osteomyelitis. This was a change just since 9 December when we last performed a CT scan. Obviously this one had gone downhill quite significantly and rapidly. At this point upon evaluation I feel like in general the patient's wound seems to be doing fairly well all things considered upon my evaluation today. Obviously this is larger and deeper than what I previously evaluated but at the same time he seems to be making some progress as far as the appearance of the granulation tissue is concerned. I'm happy in that regard. No fevers, chills, nausea, or vomiting noted at this time. He is on IV vancomycin and Rocephin at the facility. He is currently in [employer]. 08/03/18 upon evaluation today patient's wound appears to be doing better in regard to the  overall appearance at this point in time. Fortunately he's been tolerating the Wound VAC without complication and states that the facility has been taking excellent care of the wound site. Overall I see some Slough noted on the surface which I am going to attempt sharp debridement today of but nonetheless other than this I feel like he's making progress. 08/09/18 on evaluation today patient's wound appears to be doing much better compared to even last week's evaluation. Do believe that the Wound VAC is been of great benefit for him. He has been tolerating the dressing changes that is the Wound VAC without any complication and he has excellent granulation noted currently. There is no need for sharp debridement at this point. 08/16/18 on evaluation today patient actually appears to be doing very well in regard to the wound in the right gluteal fold region. This is showing signs of progress and again appears to be very healthy which is excellent news. Fortunately there is no sign of active infection by way of odor or drainage at this point. Overall I'm very  pleased with how things stand. He seems to be tolerating the Wound VAC without complication. 08/23/18 on evaluation today patient actually appears to be doing better in regard to his wound. He has been tolerating the Wound VAC without complication and in fact it has been collecting a significant amount of drainage which I think is good news especially considering how the wound appears. Fortunately there is no signs of infection at this time definitely nothing appears to be worse which is good news. He has not been started on the Bactrim and Flagyl that was recommended by Dr. Delaine Lame yet. I did actually contact her office this morning in order to check and see were things are that regard their gonna be calling me back. 08/30/18 on evaluation today patient actually appears to show signs of excellent improvement today compared to last evaluation. The  undermining is getting much better the wound seems to be feeling quite nicely and I'm very pleased that the granulation in general. With that being said overall I feel like the patient has made excellent progress which is great news. No fevers, chills, nausea, or vomiting noted at this time. 09/06/18 on evaluation today patient actually appears to be doing rather well in regard to his right gluteal ulcer. This is showing signs of improvement in overall I'm very pleased with how things seem to be progressing. The patient likewise is please. Overall I see no evidence of infection he is about to complete his oral antibiotic regimen which is the end of the antibiotics for him in just about three days. 09/13/18 on evaluation today patient's right Ischial ulcer appears to be showing signs of continued improvement which is excellent news. He's been tolerating the dressing changes without complication. Fortunately there's no signs of infection and the wound that seems to be doing very well. 09/28/18 on evaluation today patient appears to be doing rather well in regard to his right Ischial ulcer. He's been tolerating the Wound VAC Peets, Hubbard J. ([id number]) without complication he knows there's much less drainage than there used to be this obviously is not a bad thing in my pinion. There's no evidence of infection despite the fact is but nothing about it now for several weeks. 10/04/18 on evaluation today patient appears to be doing better in regard to his right Ischial wound. He has been tolerating the Wound VAC without complication and I do believe that the silver nitrate last week was beneficial for him. Fortunately overall there's no evidence of active infection at this time which is great news. No fevers, chills, nausea, or vomiting noted at this time. 10/11/18 on evaluation today patient actually appears to be doing rather well in regard to his Ischial ulcer. He's been tolerating the Wound VAC  still without complication I feel like this is doing a good job. No fevers, chills, nausea, or vomiting noted at this time. 11/01/18 on evaluation today patient presents after having not been seen in our clinic for several weeks secondary to the fact that he was on evaluation today patient presents after having not been seen in our clinic for several weeks secondary to the fact that he was in a skilled nursing facility which was on lockdown currently due to the covert 19 national emergency. Subsequently he was discharged from the facility on this past Friday and subsequently made an appointment to come in to see yesterday. Fortunately there's no signs of active infection at this time which is good news and overall he does seem to have made  progress since I last saw. Overall I feel like things are progressing quite nicely. The patient is having no pain. 11/08/18 on evaluation today patient appears to be doing okay in regard to his right gluteal ulcer. He has been utilizing a Wound VAC home health this changing this at this point since he's home from the skilled nursing facility. Fortunately there's no signs of obvious active infection at this time. Unfortunately though there's no obvious active infection he is having some maceration and his wife states that when the sheets of the Wound VAC office on Sunday when it broke seal that he ended up having significant issues with some smell as well there concerned about the possibility of infection. Fortunately there's No fevers, chills, nausea, or vomiting noted at this time. 11/15/18 on evaluation today patient actually appears to be doing well in regard to his right gluteal ulcer. He has been tolerating the dressing changes without complication. Specifically the Wound VAC has been utilized up to this point. Fortunately there's no signs of infection and overall I feel like he has made progress even since last week when I last saw him. I'm actually fairly happy  with the overall appearance although he does seem to have somewhat of a hyper granular overgrowth in the central portion of the wound which I think may require some sharp debridement to try flatness out possibly utilizing chemical cauterization following. 11/23/18 on evaluation today patient actually appears to be doing very well in regard to his sacral ulcer. He seems to be showing signs of improvement with good granulation. With that being said he still has the small area of hyper granulation right in the central portion of the wound which I'm gonna likely utilize silver nitrate on today. Subsequently he also keeps having a leak at the 6 o'clock location which is unfortunate we may be able to help out with some suggestions to try to prevent this going forward. Fortunately there's no signs of active infection at this time. 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 He's been tolerating the dressing changes without complication. Fortunately there's no signs of active infection at this time. I've been rather pleased with how things have progressed there still some evidence of pressure getting to the area with some redness right around the immediate wound opening. Nonetheless other than this I'm not seeing any significant complications or issues the wound is somewhat hyper granular. Upon discussing with the patient and his wife today I'm not sure that the wound is being packed to the base with the foam at this point. And if it's not been packed fully that may be part of the reason why is not seen as much improvement as far as the granulation from the base out. Again we do not want pack too tightly but we need some of the firm to get to the base of the wound. I discussed this with patient and his wife today. 12/06/18 on evaluation today patient appears to be doing well in regard to his right gluteal pressure  ulcer. He's been tolerating the dressing changes without complication. Fortunately there's no signs of active infection. He still has some hyper granular tissue and I do think it would be appropriate to continue with the chemical cauterization as of today. 12/16/18 on evaluation today patient actually appears to be doing okay in regard to his right gluteal ulcer. He is been tolerating the dressing  changes without complication including the Wound VAC. Overall I feel like nothing seems to be worsening I do feel like that the hyper granulation buds in the central portion of the wound have improved to some degree with the silver nitrate. We will have to see how things continue to progress. 12/20/18 on evaluation today patient actually appears to be doing much worse in my pinion even compared to last week's evaluation. Unfortunately as opposed to showing any signs of improvement the areas of hyper granular tissue in the central portion of the wound seem to be getting worse. Subsequently the wound bed itself also seems to be getting deeper even compared to last week which is both unusual as well as concerning since prior he had been shown signs of improvement. Nonetheless I think that the issue could be that he's actually having some difficulty in issues with a deeper infection. There's no external signs of infection but nonetheless I am more worried about the internal, osteomyelitis, that could be restarting. He has not been on antibiotics for some time at this point. I think that it may be a good idea to go ahead and started back on an antibiotic therapy while we wait to see what the testing shows. 12/27/18 on evaluation today patient presents for follow-up concerning his left gluteal fold wound. Fortunately he appears to be doing well today. I did review the CT scan which was negative for any signs of osteomyelitis or acute abnormality this is excellent news. Overall I feel like the surface of the wound bed  appears to be doing significantly better today compared to previously noted findings. There does not appear any signs of infection nor does he have any pain at this time. 01/03/19 on evaluation today patient actually appears to be doing quite well in regard to his ulcer. Post debridement last week he really did not have too much bleeding which is good news. Fortunately today this seems to be doing some better but we still has some of the hyper granular tissue noted in the base of the wound which is gonna require sharp debridement today as well. Overall I'm pleased with how things seem to be progressing since we switched away from the Wound VAC I think he is making some progress. 01/10/19 on evaluation today patient appears to be doing better in regard to his right gluteal fold ulcer. He has been tolerating the dressing changes without complication. The debridement to seem to be helping with current away some of the poor hyper granular tissue bugs throughout the region of his gluteal fold wound. He's been tolerating the dressing changes otherwise without complication which is great news. No fevers, chills, nausea, or vomiting noted at this time. 01/17/19 on evaluation today patient actually appears to be doing excellent in regard to his wound. He's been tolerating the dressing changes without complication. Fortunately there is no signs of active infection at this time which is great news. No fevers, chills, nausea, or vomiting noted at this time. 01/24/19 on evaluation today patient actually appears to be doing quite well with regard to his ulcer. He has been tolerating the dressing changes without complication. Fortunately there's no signs of active infection at this time. Overall been very pleased with the progress that he seems to be BUZZ, AXEL. ([id number]) making currently. 01/31/19-Patient returns at 1 week with apparent similarity in dimensions to the wound, with no signs of infection, he  has been changing dressings twice a day 02/08/19 upon evaluation today patient actually  appears to be doing well with regard to his right Ischial ulcer. The wound is not appear to be quite as deep and seems to be making progress which is good news. With that being said I'm still reluctant to go back to the Wound VAC at this point. He's been having to change the dressings twice a day which is a little bit much in my pinion from the wound care supplies standpoint. I think that possibly attempting to utilize extras orbit may be beneficial this may also help to prevent any additional breakdown secondary to fluid retention in the wound itself. The patient is in agreement with giving this a try. 02/15/19 on evaluation today patient actually appears to be doing decently well with regard to his ulcer in the right to gluteal fold location. He's been tolerating the dressing changes without complication. Fortunately there is no signs of active infection at this time. He is able to keep the current dressing in place more effectively for a day at a time whereas before he was having a changes to to three times a day. The actions or has been helpful in this regard. Fortunately there's no signs of anything getting worse and I do feel like he showing signs of good improvement with regard to the wound bed status. 02/22/2019 patient appears to be doing very well today with regard to his ulcer in the gluteal fold. Fortunately there is no signs of active infection and he has been tolerating the dressing changes without any complication. Overall extremely pleased with how things seem to be progressing. He has much less of the hyper granular projections within the wound these have slowly been debrided away and he seems to be doing well. The wound bed is more uniform. 03/01/19 on evaluation today patient appears to be doing unfortunately about the same in regard to his gluteal ulcer. He's been tolerating the dressing changes  without complication. Fortunately there's no signs of active infection at this time. With that being said he continues to develop these hyper granular projections which I'm unsure of exactly what they are and why they are rising. Nonetheless I explained to the patient that I do believe it would be a good idea for [place] to stand a biopsy sample for pathology to see if that can shed any light on what exactly may be going on here. Fortunately I do not see any obvious signs of infection. With that being said the patient has had a little bit more drainage this week apparently compared to last week. 03/08/2019 on evaluation today patient actually appears to look somewhat better with regard to the appearance of his wound bed at this time. This is good news. Overall I am very pleased with how things seem to have progressed just in the past week with a switch to the [facility] dressing. I think that has been beneficial for him. With that being said at this time the patient is concerned about his biopsy that I sent off last week unfortunately I do not have that report as of yet. Nonetheless we have called to obtain this and hopefully will hear back from the lab later this morning. 03/15/19 on evaluation today patient's wound actually appears to be doing okay today with regard to the overall appearance of the wound bed. He has been tolerating the dressing changes without complication he still has hyper granular tissue noted but fortunately that seems to be minimal at this point compared to some of what we've seen in the  past. Nonetheless I do think that he is still having some issues currently with some of this type of granulation the biopsy and since all showed nothing more than just evidence of granulation tissue. Therefore there really is nothing different to initiate or do at this point. 03/24/19 on evaluation today patient appears to be doing a little better with regard to his ulcer. He's been tolerating the  dressing changes without complication. Fortunately there is no signs of active infection at this time. No fevers, chills, nausea, or vomiting noted at this time. I'm overall pleased with how things seem to be progressing. 03/29/2019 on evaluation today patient appears to be doing about the same in regard to his ulcer in the right gluteal fold. Unfortunately he is not seeming to make a lot of progress and the wound is somewhat stalled. There is no signs of active infection externally but I am concerned about the possibility of infection continuing in the ischial location which previously he did have infection noted. Again is not able to have an MRI so probably our best option for testing for this would be a triple phase bone scan which will detect subtle changes in the bone more so than plain film x-rays. In the past they really have not been beneficial and in fact the CT scans even have been somewhat questionable at times. Nonetheless there is no signs of systemic infection which is at least good news but again his wound is not healing at all on the predicted schedule. 04/05/19 on evaluation today patient appears to be doing well all things considering with regard to his wound and the right gluteal fold. He actually has his triple bone scan scheduled for sometime in the next couple of days. With that being said I've also been looking to other possibilities of what could be causing hyper granular tissue were looking into the bone scan again for evaluating for the risk or possibility of infection deeper and I'm also gonna go ahead and see about obtaining a deep tissue culture today to send and see if there's any evidence of infection noted on culture. He's in agreement with that plan. 04/12/2019 on evaluation today patient presents for reevaluation here in our clinic concerning ongoing issues with his right gluteal fold ulcer. 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 It was discussed with him at that point that I think it would be appropriate for him to proceed with hyperbaric oxygen therapy especially in light of the fact that he is previously been on IV antibiotics at the beginning of the year for close to 3 months followed by several weeks of oral antibiotics that was all prescribed by infectious disease. He had surgical debridement around Christmas December 2019 due to an abscess and osteomyelitis of the ischial bone. Unfortunately this has not really proceeded as well as we would have liked and again we did a CT scan even a couple months ago as he cannot have an MRI secondary to having issues with both a pain pump as well as a spinal cord stimulator which prevent him from going into an MRI machine. With that being said there were chronic changes noted at the ischial tuberosity which had progressed since December 2019 there was no evidence of fluid collection on that initial CT scan. With that being said that was on January 21, 2019. I am not sure that it  was a sensitive enough test as compared to an MRI and then subsequently I ordered a bone scan for him which was actually completed on 03/29/2019 and this revealed that he does indeed have positive osteomyelitis involving the right ischial tuberosity. This is adjacent to the ulcer and I think is the reason that his ulcer is not healing. Subsequently I am in a place him back on oral antibiotics today unfortunately his wound culture showed just mixed gram-negative flora with no specific findings of a predominant organism. Nonetheless being with the location I think a good broad-spectrum antibiotic for gram-negative's is a good choice at this point he is previously taken Levaquin without significant issues and I think that is an appropriate antibiotic for him at this time. 04/19/2019 on evaluation today patient actually appears to be  doing fairly well with regard to his wound. He has been tolerating the dressing changes without complication. Fortunately there is no signs of active infection and he has been taking the oral antibiotics at this time. Subsequently we did make a referral to infectious disease although Dr. Steva Ready wants the patient to be seen by general surgery first in order apparently to see if there is anything from a surgical standpoint that should be done prior to initiation of IV antibiotic therapy. Again the patient is okay with actually seeing Dr. Celine Ahr whom he has seen before we will make that referral. 04/26/2019 on evaluation today patient actually appears to be doing well with regard to his wound. He has been tolerating the dressing changes MATTHIEU, LOFTUS. ([id number]) without complication. Fortunately there is no signs of active infection at this time. I do believe that the hyperbaric oxygen therapy along with the antibiotics which I prescribed at this point have been doing well for him. With that being said he has not seen Dr. Celine Ahr the surgeon yet in fact they have not been contacted for scheduling an appointment as of yet. Subsequently the patient is not on antibiotics currently by IV Dr. Steva Ready did want him to see Dr. Celine Ahr first which we are working on trying to get scheduled. We again give the information to the patient today for Dr. Celine Ahr and her number as far as contacting their office to see about getting something scheduled. Again were looking for whether or not they recommend any surgical intervention. 05/03/2019 on evaluation today patient appears to be doing about the same with regard to his wound there may be a little bit of filling in in regard to the base of the wound but still he has quite a bit of hyper granular tissue that I am seeing today. I believe that he may may need a more aggressive sharp debridement possibly even by the surgeon or under anesthesia in order to clear  away some of the hyper granular material in order to help allow for appropriate granulation to fill in. We have made a referral to Dr. Celine Ahr unfortunately it sounds as if they did not receive the referral we contacted them today they should be get in touch with the patient. Depending on how things show from that standpoint the patient may need to see Dr. Ardyth Gal who is the infectious disease specialist although he really does not want a PICC line again. No fevers, chills, nausea, vomiting, or diarrhea. He is tolerating hyperbaric oxygen therapy very well. 05/12/2019 on evaluation today patient presents for follow-up visit concerning his right gluteal fold ulcer. He did see Dr. Celine Ahr the surgeon who previously evaluated his wound and  she actually felt like he was doing quite well with regard to the wound based on what she was seen. He does seem to be responding to some degree with the oral antibiotics along with hyperbaric oxygen therapy at this point she did not see any evidence or need for further surgical intervention at this point. She recommended deferring additional or ongoing antibiotic therapy to Dr. Steva Ready at her discretion. Fortunately there is no signs of active systemic infection. No fevers, chills, nausea, vomiting, or diarrhea. 10/29; patient I am seeing really for the first time today in terms of his wound. 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 He is being treated with hyperbaric oxygen for underlying osteomyelitis most recently documented by a three-phase bone scan. He has been on Levaquin for roughly a month and he is out of these and asked me for a refill. Most recent cultures were negative although I do not think these were bone cultures. He has a very irregular surface to this wound which is almost nodular in texture. It undermines superiorly with the same nodular hyper granulated surface. I see that he was referred to general surgery for an  operative debridement although the surgeon did not agree with this I think that would have been the proper course of action in this. He has been using Hydrofera Blue. He is supposed to start a wound VAC tomorrow which is actually a reapplication of wound VAC. I think mostly last time they had trouble keeping the seal in place I hope we have better look at this this time. 05/24/2019 on evaluation today I did review patient's note from Dr. Dellia Nims where he was seen last week when I was out of the office. Subsequently it does appear that Dr. Dellia Nims was in agreement that the patient really needed debridement to clear away some of the nodular tissue in the base of the wound. The good news is he has the wound VAC initiated and some of this tissue is already starting to breakdown under the treatment of the wound VAC again because it is not really viable. Nonetheless I do believe that we can likely perform some debridement today to clear this away and hopefully the continuation of the wound VAC along with hyperbarics and antibiotic therapy will be beneficial in stopping this from reoccurring. If we get better control in the wound bed in a good spot I think we can definitely use the PriMatrix which we actually did get approval for. 05/31/2019 on evaluation today patient appears to be doing actually pretty well at this point with regard to his wound. He has been tolerating the dressing changes which is good news. Fortunately there is no signs of infection. He still has some of the hyper granular nodules noted we have not really cleared all these away and I think we can try to do a little bit more that today. I did do quite a bit last week I am hoping to get down to the base of the wound so though actually be able to heal more appropriately. I think the wound VAC is doing a good job right now with controlling the drainage and overall the patient is happy with that. 06/07/2019 upon evaluation today patient appears  to be doing quite well compared to last week with regard to the hyper granulation in the base of the wound. He has been tolerating the dressing changes without complication. Fortunately there is no signs of active infection at this time. With  that being said we have not had any recent measures as far as blood work is concerned I think we may want to check a few things including a CBC, sed rate, and C-reactive protein. Patient is in agreement with that we can try to get that scheduled for him for this Friday. 06/13/2019 on evaluation today patient actually appears to be doing much better at this point based on what I am seeing with regard to his wound. He has much less in the way of hyper granular tissue which is good news and a lot more healing that is taken place since I last saw him as far as the amount of space within the wound itself. With that being said he is nearing the completion of the initial 40 treatments for hyperbaric oxygen therapy I think this is something I would recommend extending as he does seem to be benefiting at this time. Fortunately there is no evidence of active systemic infection at this point nor even local infection for that matter. 06/20/2019 upon evaluation today patient actually appears to be doing well with regard to his wound. In fact this appears to be doing much better today compared to what it was previous. I been extremely pleased with the progress that he is made. In fact the tunnel is much less deep today than what it was in the past and I am seeing excellent signs of improvement. Overall I am happy with how things are going. I did review his white blood cell count which revealed everything to be okay. The one abnormal finding on his CBC was a hemoglobin of 12.7. Subsequently I did also review his sed rate and C-reactive protein. His sed rate measured in at normal level measuring at 26. The C-reactive protein however was elevated today and an abnormal range  indicating inflammation. Still I feel like he is trending in a good direction at this time. He is going to need a refill of the Levaquin he tells me at this point. 07/04/2019 on evaluation today patient actually appears to be doing well in regard to his wound. He has been tolerating the dressing changes without complication. Fortunately there is no signs of active infection which is good news. No fevers, chills, nausea, vomiting, or diarrhea. The good news also is that I did review his labs and though his sed rate was slightly elevated the C-reactive protein was actually down compared to the last evaluation 2 weeks ago this is excellent news I think it is showing signs of improvement. 07/11/2019 on evaluation today patient actually appears to be doing quite well with regard to his wound on my evaluation. This is very slow going but nonetheless he seems to be making progress. Especially in regard to the depth. Nonetheless I believe that we may want to consider going forward with the PriMatrix which was previously approved for him. We will get a have to get a new approval as we will actually be applying this in the new year so we will likely obtain that approval on January 4. In the meantime and for the time being my suggestion is going to be that we go ahead and continue with the wound VAC and we were going at this point with the current wound care measures. 12/28-Patient seen after HBO session today, wound depth with the tunnel measures 3.2 cm, the wound bed appears healthy, continuing with the VAC and HBO 07/25/2019 on evaluation today patient's wound appears to be doing about the same compared to my  last evaluation. Fortunately there is no evidence of significant infection which is good news we will still waiting on the results of his lab work which unfortunately was not done prior to today's visit which is what we were aiming for. Ultimately there is no sign of active infection at this time which is  good news. And he also tells me that the wound VAC has been staying in place and doing fairly well which is also good news. With that being said I am concerned about the fact CLARANCE, BOLLARD. ([id number]) that the wound has not progressed as much I think we may want to look into getting reapproval for the skin substitute, specifically PriMatrix, that I am hopeful will help to allow this area to granulate in more effectively. I would recommend as well continuing with the wound VAC along with this. 08/01/2019 on evaluation today patient appears to be doing in my opinion slightly better in regard to the wound. He actually does have some tightening of the walls around the edges which is a good thing. Fortunately there is no signs of active infection at this time. No fever chills noted. I do believe that the patient in general seems to be making progress although is very slow. Obviously were trying to get things to speed up somewhat more. No fevers, chills, nausea, vomiting, or diarrhea. 08/08/2019 upon evaluation today patient appears to be doing well with regard to his wound although there is not a significant improvement overall we do have the PriMatrix for availability today to apply. Fortunately there is no signs of active infection and the patient overall seems to be doing well. There is really no need for aggressive sharp debridement at this point either which is also good news. I do believe her get a need to suture and the primary checks in the basin in the PriMatrix in order to keep it in place at the base of the wound underneath the wound VAC. This was discussed with the patient today as well. He is in agreement the plan. He really does not have any filling in the area and so this should not be a complication as far as suturing is concerned. 08/15/2019 on evaluation today patient appears to be doing somewhat better in my opinion in regard to the overall appearance of his wound. He has been  tolerating the dressing changes without complication. Fortunately the skin subappear to still be in place when this was changed on Friday. With that being said it appears that the PriMatrix may have dissolved in the interim between now and then as the sutures are no longer holding anything in place and again I would sutured it into place. Nonetheless I believe that we can go ahead and likely consider going forward that we may need to apply the PriMatrix weekly as opposed every other week as it does seem to have dissolved rather readily and well. Overall the wound bed appears to be doing excellent at this time. 08/22/2019 upon evaluation today the patient does seem to making some progress with regard to his wound. Overall there is some better quality tissue and less of the hyper granular protrusions all of which is good news. With that being said we do have the PriMatrix available for application today and I am in a try to find a better way to suture this in place for him today. 08/29/2019 on evaluation today patient appears to be doing very well in regard to his wound. I do see signs of  new granulation which seems to be somewhat helpful he did have some need for sharp debridement today to clear some of the hyper granulation away but nonetheless I feel like we are headed in a good direction. Fortunately there is no evidence of any systemic infection although we are going to go ahead and reorder the CBC, C-reactive protein, and sed rate today to see where things stand I did just refill his Levaquin as well. 09/05/2019 upon evaluation today patient's wound actually is not appearing to be showing signs of significant improvement compared to what we have seen previous from the standpoint of depth. He overall wound size wise seems to be about the same and again some of the quality of the wound bed is still doing fairly well. I did review his labs and his CBC was normal with no signs of elevation white blood cell  count, his C-reactive protein was normal, and the sed rate was slightly elevated but again I think this may just be due to ulceration really there is nothing significant to indicate severe infection. I think when he likely finishes his current round of oral antibiotics with Levaquin will likely take him off of the medication at that point based on the findings. I do feel like the hyperbarics helped in getting the infection under control as well as improving the overall size of the wound quite significantly. With that being said at this point I really feel like more more at the time frame that he may benefit from a muscle flap in order to try to help this wound heal effectively. I think that this would be the fastest way in order to do this. In the past he has been somewhat resistant to going down that path but nonetheless I feel like he likely should. 09/12/2019 on evaluation today patient appears to be doing fairly well in regard to his wound. He is tolerating the wound VAC without complication is still continues to drain significantly. Fortunately there is no signs of active infection at this time which is great news his lab work is also been doing very well. With that being said I do think at this point that the patient is still deriving benefit from the wound VAC especially in regard to controlling moisture which I think it is doing fairly well. He also has some improvement in the overall wound bed and on the medial aspect of the wound there are some hyper granular projections but nothing as severe as what they have been in the past. I am good have to perform some sharp debridement at this location today. 09/19/2019 upon evaluation today patient appears to be doing roughly the same although again the tissue does seem to be showing some signs of improvement which is good news. There is less hyper granular budding. Nonetheless he still is going require some sharp debridement today. We did get his  albumin and prealbumin results back and they are within normal limits in both cases which is excellent news that was the one thing [facility] plastic surgery was waiting on order to get him scheduled. He does seem to be a good candidate physically for the flap surgery if he decides to go that direction. With that being said is still left to be determined once they see the wound what they feel about how best to proceed. Obviously in the end it would be up to Mr. Bartel as to whether he goes forward with the flap surgery or something else depending on the recommendations.  09/26/2019 upon evaluation today patient appears to be doing about the same in regard to his wound. Fortunately there is no signs of active infection there is also not as much hyper granulation noted today in fact I do not believe there is can be any sharp debridement required at this point. With that being said I do believe he continues to be somewhat stalled at this point with regard to his wound. There is no signs of active infection at this time which is good news. He does have an appointment with [facility] plastic surgery on Friday. 10/03/2019 upon evaluation today patient appears to be doing somewhat poorly especially in his overall demeanor compared to last week's evaluation. He unfortunately did see the physician at [facility] plastic surgery. Unfortunately the plan for the possibility of a flap would be a fairly significant surgery that the patient is really not wanting to take the risk of as if the surgery failed he would end up with a very large open wound at that point. Fortunately there is no signs of active infection at this time. No fevers, chills, nausea, vomiting, or diarrhea. With that being said there was some question that the physician had about why this would not be healing and the possibility that there may be some bone still needs to be removed deeper in the wound. Again the bone scan that we did previous actually did show evidence  of there still being a chronic osteomyelitis back on March 29, 2019. With that being said he did undergo treatment both with hyperbarics as well as oral antibiotics for quite some time since that point. Obviously he may need additional and updated imaging with the surgeon in order to see if there is anything else that can or will need to be done in order to help this area to heal. Obviously I still believe as well that there is something going on here. He is already seeing the surgeon that performed the original surgery after the bone scan but they did not feel that anything needed to be done from a surgical standpoint at that time. 10/10/2019 upon evaluation today patient appears to be really doing about the same with regard to his wound in the gluteal fold region. Unfortunately he is not making a lot of improvement the insurance also contacted home health and apparently the wound VAC is no longer to be covered due to lack of progress. Obviously that is something that we discussed as well as a strong possibility for happening soon and this was even last week that discussed this with him. With that being said again we have tried to manage this wound as best as I could up to this point. Fortunately there is no evidence of active infection at this time. No fevers, chills, nausea, vomiting, or diarrhea. SAW, MENDENHALL ([id number]) 10/17/2019 upon evaluation today patient has decided to proceed with the flap surgery. He actually seems to be in better spirits today he states he wants to get this healed and after talking to the surgeon last week he feels like that is his best option for doing so. With that being said he also needs to have a referral to infectious disease to be seen for IV antibiotics preoperatively and postoperatively as far as the skin flap is concerned. He wonders if Dr. Steva Ready would be interested or available to do this for him. I explained that I would definitely have to check  with her and I will be happy to do that but I  am not certain What her answer will be in 2 I have a discussion with her. 10/24/2019 upon evaluation today patient appears to be doing about the same with regard to his wound although he is complaining of increased pain he is really not had any discomfort he states that it is getting much worse at this point. Fortunately there is no signs of active infection. At least not systemically. With that being said I question whether local infection could be a problem here secondary to the fact that to be honest the patient is having pain where he has not previous. 10/31/2019 upon evaluation today patient appears to be doing about the same in regard to his wound in the right gluteal fold region. Subsequently in the interim since I last saw him last week I did receive a message from Dr. Steva Ready as well as reviewed her note. She was requesting the potential for a bone culture or at least a deep tissue culture in order to see about helping with more directed antibiotic therapy for the patient's projected flap surgery. If we could not obtain more specific organisms for treatment then obviously should be looking at more broad-spectrum antibiotic therapy. With that being said at this point the patient seems ready to move forward with the flap surgery. I did actually sign the paperwork for his bed which came through last week I believe that was on Thursday or Friday. Either way that should be coming to him as well so he will have that in place once the surgery occurs when he is discharged home. Fortunately there is no signs of active infection at this time. No fevers, chills, nausea, vomiting, or diarrhea. 11/15/2019 upon evaluation today patient appears to be doing about the same with regard to his right gluteal fold ulcer. He has been tolerating the dressing changes without complication right now he is awaiting the hospital bed with air mattress in order to be able to be  scheduled for his flap surgery. He is on IV vancomycin at this point is managed by Dr. Steva Ready. Electronic Signature(s) Signed: 11/15/2019 6:04:44 PM By: Worthy Keeler [name] Entered By: Worthy Keeler on 11/15/2019 18:04:44 CORRON, MULLOY ([id number]) -------------------------------------------------------------------------------- Physical Exam Details Patient Name: [name] Date of Service: 11/15/2019 3:30 PM Medical Record Number: [id number] Patient Account Number: [id number] Date of Birth/Sex: [birth year] (69 y.o. M) Treating RN: Montey Hora Primary Care Provider: [name] Other Clinician: Referring Provider: [name] Treating Provider/Extender: [name], Timmothy Baranowski Weeks in Treatment: 12 Constitutional Well-nourished and well-hydrated in no acute distress. Respiratory normal breathing without difficulty. Psychiatric this patient is able to make decisions and demonstrates good insight into disease process. Alert and Oriented x 3. pleasant and cooperative. Notes Upon inspection patient's wound bed actually showed signs of good granulation at this time. Fortunately there is no signs of severe infection although he did have hyper granular areas as well. Unfortunately I really do not feel like this is making any progress he does have a plan but right now this plan is depending upon him getting approval for the air mattress which him and his wife are still working on. Electronic Signature(s) Signed: 11/15/2019 6:05:13 PM By: Worthy Keeler [name] Entered By: Worthy Keeler on 11/15/2019 18:05:13 WYNDELL, LEPINE ([id number]) -------------------------------------------------------------------------------- Physician Orders Details Patient Name: [name] Date of Service: 11/15/2019 3:30 PM Medical Record Number: [id number] Patient Account Number: [id number] Date of Birth/Sex: [birth year] (69 y.o. M) Treating RN: Montey Hora Primary Care  Provider:  Hande, Vishwanath Other Clinician: Referring Provider: [name] Treating Provider/Extender: [name], Casin Federici Weeks in Treatment: 102 Verbal / Phone Orders: No Diagnosis Coding Wound Cleansing Wound #1 Right Gluteal fold o Clean wound with Normal Saline. - in office o Cleanse wound with mild soap and water Anesthetic (add to Medication List) Wound #1 Right Gluteal fold o Topical Lidocaine 4% cream applied to wound bed prior to debridement (In Clinic Only). Skin Barriers/Peri-Wound Care o Skin Prep Primary Wound Dressing Wound #1 Right Gluteal fold o Silver Alginate Secondary Dressing Wound #1 Right Gluteal fold o Tegaderm o XtraSorb Dressing Change Frequency Wound #1 Right Gluteal fold o Change dressing every other day. Follow-up Appointments Wound #1 Right Gluteal fold o Return Appointment in 2 weeks. Off-Loading Wound #1 Right Gluteal fold o Turn and reposition every 2 hours - no pressure on wounded area Home Health Wound #1 Right Gluteal fold o [place] Visits - [place] Nurse may visit PRN to address patientos wound care needs. o FACE TO FACE ENCOUNTER: MEDICARE and MEDICAID PATIENTS: I certify that this patient is under my care and that I had a face-to-face encounter that meets the physician face-to-face encounter requirements with this patient on this date. The encounter with the patient was in whole or in part for the following MEDICAL CONDITION: (primary reason for [place]) MEDICAL NECESSITY: I certify, that based on my findings, NURSING services are a medically necessary home health service. HOME BOUND STATUS: I certify that my clinical findings support that this patient is homebound (i.e., Due to illness or injury, pt requires aid of supportive devices such as crutches, cane, wheelchairs, walkers, the use of special transportation or the assistance of another person to leave their place of  residence. There is a normal inability to leave the home and doing so requires considerable and taxing effort. Other absences are for medical reasons / religious services and are infrequent or of short duration when for other reasons). o If current dressing causes regression in wound condition, may D/C ordered dressing product/s and apply Normal Saline Moist Dressing daily until next [place] / Other MD appointment. [place] of regression in wound condition at [phone]. o Please direct any NON-WOUND related issues/requests for orders to patient's Primary Care Physician DAWON, SOKOLOWSKI ([id number]) Electronic Signature(s) Signed: 11/15/2019 4:58:50 PM By: Montey Hora Signed: 11/15/2019 6:46:24 PM By: Worthy Keeler [name] Entered By: Montey Hora on 11/15/2019 16:40:10 TONI, NESCI ([id number]) -------------------------------------------------------------------------------- Problem List Details Patient Name: [name] Date of Service: 11/15/2019 3:30 PM Medical Record Number: [id number] Patient Account Number: [id number] Date of Birth/Sex: 1951/03/14 (69 y.o. 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 He states that there is no history of diabetes that he is aware of. He does have issues with his prostate and is currently receiving treatment for this by way of oral medication. With that being said I do not have a lot of details in that regard. Nonetheless the patient presents today as a result of having been referred to [place] by another provider initially home health was set to come out and take care of his wound although due to the fact that he apparently drives he's not able to receive home health. His wife is therefore trying to help take care of this wound within although they have been struggling with what exactly to do at this point. She states that she can do some things but she is definitely not a nurse and does have some issues with looking at blood. The good news is the wound does not appear to be too deep and is fairly superficial at this point. There is no slough noted there is some nonviable skin noted around the surface of the wound and the perimeter at this point. The  central portion of the wound appears to be very good with a dermal layer noted this does not appear to be again deep enough to extend it to subcutaneous tissue at this point. 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 In the pump he has Dilaudid and baclofen. 12/07/17 on evaluation today patient presents for follow-up concerning his ongoing lower back thigh ulcer on the right. He states that he did not get the supplies ordered and therefore has not really been able to perform the dressing changes as directed exactly. His wife was able to get some Boarder Foam Dressing's from the drugstore and subsequently has been using hydrogel which did help to a degree in the wound does appear to be able smaller. There is actually more drainage this week noted than previous. 12/21/17 on evaluation today patient appears to be doing rather well in regard to his right gluteal ulcer. He has been tolerating the dressing changes without complication. There does not appear to be any evidence of infection at this point in time. Overall the wound does seem to be making some progress as far as the edges are concerned there's not as much in the way of overlapping of the external wound edges and he has a good epithelium to wound bed border for the most part. This however is not true right at the 12 o'clock location over the span of a little over a centimeters which actually will require debridement today to clean this away and hopefully allow it to continue to heal more appropriately. 12/28/17 on evaluation today patient appears to be doing rather well in regard to his ulcer in the left gluteal region. He's been tolerating the dressing changes without complication. Apparently he has had some difficulty getting his dressing material. Apparently there's  been some confusion with ordering we're gonna check into this. Nonetheless  overall he's been showing signs of improvement which is good news. Debridement is not required today. 01/04/18 on evaluation today patient presents for follow-up concerning his right gluteal ulcer. He has been tolerating the dressing changes fairly well. On inspection today it appears he may actually have some maceration them concerned about the fact that he may be developing too much moisture in and around the wound bed which can cause delay in healing. With that being said he unfortunately really has not showed significant signs of improvement since last week's evaluation in fact this may even be just the little bit/slightly larger. Nonetheless he's been having a lot of discomfort I'm not sure this is even related to the wound as he has no pain when I'm to breeding or otherwise cleaning the wound during evaluation today. Nonetheless this is something that we did recommend he talked to his pain specialist concerning. 01/11/18 on evaluation today patient appears to be doing better in regard to his ulceration. He has been tolerating the dressing changes without complication. With that being said overall there's no evidence of infection which is good news. The only thing is he did receive the hatch affair blue classic versus the ready nonetheless I feel like this is perfectly fine and appears to have done well for him over the past week. 01/25/18 on evaluation today patient's wound actually appears to be a little bit larger than during the last evaluation. The good news is the majority of the wound edges actually appear to be fairly firmly attached to the wound bed unfortunately again we're not really making progress in regard to the size. Roughly the wound is about the same size as when I first saw him although again the wound margin/edges appear to be much better. 02/01/18 on evaluation today patient actually appears to be doing very well in regard to his wound. Applying the Prisma dry does seem to  be better although he does still have issues with slow progression of the wound. There was a slight improvement compared to last week's measurements today. Nonetheless I have been considering other options as far as the possibility of Theraskin or even a snap vac. In general I'm not sure that the Theraskin due to location of the wound would be a very good idea. Nonetheless I do think that a snap vac could be a possibility for the patient and in fact I think this could even be an excellent way to manage the wound possibly seeing some improvement in a very rapid fashion here. Nonetheless this is something that we would need to get approved and I did have a lengthy conversation with the patient about this today. 02/08/18 on evaluation today patient appears to be doing a little better in regard to his ulcer. He has been tolerating the dressing changes without complication. Fortunately despite the fact that the wound is a little bit smaller it's not significantly so unfortunately. We have discussed the possibility of a snap vac we did check with insurance this is actually covered at this point. Fortunately there does not appear to be any sign of infection. Overall I'm fairly pleased with how things seem to be appearing at this point. 02/15/18 on evaluation today patient appears to be doing rather well in regard to his right gluteal ulcer. Unfortunately the snap vac did not stay in place with his sheer and friction this came loose and did not seem to maintain seal very  well. He worked for about two days and it did seem to do very well during that time according to his wife but in general this does not seem to be something that's gonna be beneficial for him long-term. BENTO, GANTERT ([id number]) do believe we need to go back to standard dressings to see if we can find something that will be of benefit. 03/02/18- He is here in follow up evaluation; there is minimal change in the wound. He will continue  with the same treatment plan, would consider changing to iodosrob/iodoflex if ulcer continues to to plateau. He will follow up next week 03/08/18 on evaluation today patient's wound actually appears to be about the same size as when I previously saw him several weeks back. Unfortunately he does have some slightly dark discoloration in the central portion of the wound which has me concerned about pressure injury. I do believe he may be sitting for too long a period of time in fact he tells me that "I probably sit for much too long". He does have some Slough noted on the surface of the wound and again as far as the size of the wound is concerned I'm really not seeing anything that seems to have improved significantly. 03/15/18 on evaluation today patient appears to be doing fairly well in regard to his ulcer. The wound measured pretty much about the same today compared to last week's evaluation when looking at his graph. With that being said the area of bruising/deep tissue injury that was noted last week I do not see at this point. He did get a new cushion fortunately this does seem to be have been of benefit in my pinion. It does appear that he's been off of this more which is good news as well I think that is definitely showing in the overall wound measurements. With that being said I do believe that he needs to continue to offload I don't think that the fact this is doing better should be or is going to allow him to not have to offload and explain this to him as well. Overall he seems to be in agreement the plan I think he understands. The overall appearance of the wound bed is improved compared to last week I think the Iodoflex has been beneficial in that regard. 03/29/18 on evaluation today patient actually appears to be doing rather well in regard to his wound from the overall appearance standpoint he does have some granulation although there's some Slough on the surface of the wound noted as well.  With that being said he unfortunately has not improved in regard to the overall measurement of the wound in volume or in size. I did have a discussion with him very specifically about offloading today. He actually does work although he mainly is just sitting throughout the day. He tells me he offloads by "lifting himself up for 30 seconds off of his chair occasionally" purchase from [employer] which does seem to have helped. And he has a new cushion that he with that being said he's also able to stand some for a very short period of time but not significant enough I think to provide appropriate offloading. I think the biggest issue at this point with the wound and the fact is not healing as quickly as we would like is due to the fact that he is really not able to appropriately offload while at work. He states the beginning after his injury he actually had a bed  at his job that he could lay on in order to offload and that does seem to have been of help back at that time. Nonetheless he had not done this in quite some time unfortunately. I think that could be helpful for him this is something I would like for him to look into. 04/05/18 on evaluation today patient actually presents for follow-up concerning his right gluteal ulcer. Again he really is not significantly improved even compared to last week. He has been tolerating the dressing changes without complication. With that being said fortunately there appears to be no evidence of infection at this time. He has been more proactive in trying to offload. 04/12/18 on evaluation today patient actually appears to be doing a little better in regard to his wound and the right gluteal fold region. He's been tolerating the dressing changes since removing the oasis without complication. However he was having a lot of burning initially with the oasis in place. He's unsure of exactly why this was given so much discomfort but he assumes that it was the oasis  itself causing the problem. Nonetheless this had to be removed after about three days in place although even those three days seem to have made a fairly good improvement in regard to the overall appearance of the wound bed. In fact is the first time that he's made any improvement from the standpoint of measurements in about six weeks. He continues to have no discomfort over the area of the wound itself which leads me to wonder why he was having the burning with the oasis when he does not even feel the actual debridement's themselves. I am somewhat perplexed by this. 04/19/18 on evaluation today patient's wound actually appears to be showing signs of epithelialization around the edge of the wound and in general actually appears to be doing better which is good news. He did have the same burning after about three days with applying the Endoform last week in the same fashion that I would generally apply a skin substitute. This seems to indicate that it's not the oasis to cause the problem but potentially the moisture buildup that just causes things to burn or there may be some other reaction with the skin prep or Steri-Strips. Nonetheless I'm not sure that is gonna be able to tolerate any skin substitute for a long period of time. The good news is the wound actually appears to be doing better today compared to last week and does seem to finally be making some progress. 04/26/18 on evaluation today patient actually appears to be doing rather well in regard to his ulcer in the right gluteal fold. He has been tolerating the dressing changes without complication which is good news. The Endoform does seem to be helping although he was a little bit more macerated this week. This seems to be an ongoing issue with fluid control at this point. Nonetheless I think we may be able to add something like Drawtex to help control the drainage. 05/03/18 on evaluation today patient appears to actually be doing better in  regard to the overall appearance of his wound. He has been tolerating the dressing changes without complication. Fortunately there appears to be no evidence of infection at this time. I really feel like his wound has shown signs as of today of turning around last week I thought so as well and definitely he could be seen in this week's overall appearance and measurements. In general I'm very pleased with the fact that he  finally seems to be making a steady but sure progress. The patient likewise is very pleased. 05/17/18 on evaluation today patient appears to be doing more poorly unfortunately in regard to his ulcer. He has been tolerating the dressing changes without complication. With that being said he tells me that in the past couple of days he and his wife have noticed that we did not seem to be doing quite as well is getting dark near the center. Subsequently upon evaluation today the wound actually does appear to be doing worse compared to previous. He has been tolerating the dressing changes otherwise and he states that he is not been sitting up anymore than he was in the past from what he tells me. Still he has continued to work he states "I'm tired of dealing with this and if I have to just go home and lay in the bed all the time that's what I'll do". Nonetheless I am concerned about the fact that this wound does appear to be deeper than what it was previous. 05/24/18 upon evaluation today patient actually presents after having been in the hospital due to what was presumed to be sepsis secondary to the wound infection. He had an elevated white blood cell count between 14 and 15. With that being said he does seem to be doing somewhat better now. His wound still is giving him some trouble nonetheless and he is obviously concerned about the fact likely talked about that this does seem to go more deeply than previously noted. I did review his wound culture which showed evidence of Staphylococcus  aureus him and group B strep. Nonetheless he is on antibiotics, Levaquin, for this. Subsequently I did review his intake summary from the hospital as well. I also did look at the CT of the lumbar spine with contrast that was performed which showed no bone destruction to suggest lumbar disguises/osteomyelitis or sacral osteomyelitis. There was no paraspinal abscess. Nonetheless it appears this may have been more of just a soft tissue infection at this point which is good news. He still is nonetheless concerned about the wound which again I think is completely reasonable considering everything he's been through recently. 05/31/18 on evaluation today on evaluation today patient actually appears to be showing signs of his wound be a little bit deeper than what I would like to see. Fortunately he does not show any signs of significant infection although his temperature was 99 today he states he's been SHAKIB, SCHOEPF. ([id number]) checking this at home and has not been elevated. Nonetheless with the undermining that I'm seeing at this point I am becoming more concerned about the wound I do think that offloading is a key factor here that is preventing the speedy recovery at this point. There does not appear to be any evidence of again over infection noted. He's been using Santyl currently. 06/07/18 the patient presents today for follow-up evaluation regarding the left ulcer in the gluteal region. He has been tolerating the Wound VAC fairly well. He is obviously very frustrated with this he states that to mean is really getting in his way. There does not appear to be any evidence of infection at this time he does have a little bit of odor I do not necessarily associate this with infection just something that we sometimes notice with Wound VAC therapy. With that being said I can definitely catch a tone of discontentment overall in the patient's demeanor today. This when he was previously in the hospital  an CT scan was done of the lumbar region which did not reveal any signs of osteomyelitis. With that being said the pelvis in particular was not evaluated distinctly which means he could still have some osteonecrosis I. Nonetheless the Wound VAC was started on Thursday I do want to get this little bit more time before jumping to a CT scan of the pelvis although that is something that I might would recommend if were not see an improvement by that time. 06/14/18 on evaluation today patient actually appears to be doing about the same in regard to his right gluteal ulcer. Again he did have a CT scan of the lumbar spine unfortunately this did not include the pelvis. Nonetheless with the depth of the wound that I'm seeing today even despite the fact that I'm not seeing any evidence of overt cellulitis I believe there's a good chance that we may be dealing with osteomyelitis somewhere in the right Ischial region. No fevers, chills, nausea, or vomiting noted at this time. 06/21/18 on evaluation today patient actually appears to be doing about the same with regard to his wound. The tunnel at 6 o'clock really does not appear to be any deeper although it is a little bit wider. I think at this point you may want to start packing this with white phone. Unfortunately I have not got approval for the CT scan of the pelvis as of yet due to the fact that Medicare apparently has been denied it due to the diagnosis codes not being appropriate according to Medicare for the test requested. With that being said the patient cannot have an MRI and therefore this is the only option that we have as far as testing is concerned. The patient has had infection and was on antibiotics and been added code for cellulitis of the bottom to see if this will be appropriate for getting the test approved. Nonetheless I'm concerned about the infection have been spread deeper into the Ischial region. 06/28/18 on evaluation today patient actually  appears to be doing rather well all things considered in regard to the right gluteal ulcer. He has been tolerating the dressing changes without complication. With that being said the Wound VAC he states does have to be replaced almost every day or at least reinforced unfortunately. Patient actually has his CT scan later this morning we should have the results by tomorrow. 07/05/18 on evaluation today patient presents for follow-up concerning his right Ischial ulcer. He did see the surgeon Dr. Lysle Pearl last week. They were actually very happy with him and felt like he spent a tremendous amount of time with them as far as discussing his situation was concerned. In the end Dr. Lysle Pearl did contact me as well and determine that he would not recommend any surgical intervention at this point as he felt like it would not be in the patient's best interest based on what he was seeing. He recommended a referral to infectious disease. Subsequently this is something that Dr. Ines Bloomer office is working on setting up for the patient. As far as evaluation today is concerned the patient's wound actually appears to be worse at this point. I am concerned about how things are progressing and specifically about infection. I do not feel like it's the deeper but the area of depth is definitely widening which does have me concerned. No fevers, chills, nausea, or vomiting noted at this time. I think that we do need initiate antibiotic therapy the patient has an allow allergy to  amoxicillin/penicillin he states that he gets a rash since childhood. Nonetheless she's never had the issues with Catholics or cephalosporins in general but he is aware of. 07/27/18 on evaluation today patient presents following admission to the hospital on 07/09/18. He was subsequently discharged on 07/20/18. On 07/15/18 the patient underwent irrigation and debridement was soft tissue biopsy and bone biopsy as well as placement of a Wound VAC in the OR by Dr.  Celine Ahr. During the hospital course the patient was placed on a Wound VAC and recommended follow up with surgery in three weeks actually with Dr. Delaine Lame who is infectious disease. The patient was on vancomycin during the hospital course. 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 He also had a bone culture which revealed evidence of methicillin-resistant staph aureus. He is updated CT scan 07/09/18 reveals that he had progression of the which was performed on wound to breakdown down to the trochanter where he actually had irregularities there as well suggestive of osteomyelitis. This was a change just since 9 December when we last performed a CT scan. Obviously this one had gone downhill quite significantly and rapidly. At this point upon evaluation I feel like in general the patient's wound seems to be doing fairly well all things considered upon my evaluation today. Obviously this is larger and deeper than what I previously evaluated but at the same time he seems to be making some progress as far as the appearance of the granulation tissue is concerned. I'm happy in that regard. No fevers, chills, nausea, or vomiting noted at this time. He is on IV vancomycin and Rocephin at the facility. He is currently in [facility]. 08/03/18 upon evaluation today patient's wound appears to be doing better in regard to the overall appearance at this point in time. Fortunately he's been tolerating the Wound VAC without complication and states that the facility has been taking excellent care of the wound site. Overall I see some Slough noted on the surface which I am going to attempt sharp debridement today of but nonetheless other than this I feel like he's making progress. 08/09/18 on evaluation today patient's wound appears to be doing much better compared to even last week's evaluation. Do believe that the Wound VAC is been of great benefit for him. He has been  tolerating the dressing changes that is the Wound VAC without any complication and he has excellent granulation noted currently. There is no need for sharp debridement at this point. 08/16/18 on evaluation today patient actually appears to be doing very well in regard to the wound in the right gluteal fold region. This is showing signs of progress and again appears to be very healthy which is excellent news. Fortunately there is no sign of active infection by way of odor or drainage at this point. Overall I'm very pleased with how things stand. He seems to be tolerating the Wound VAC without complication. 08/23/18 on evaluation today patient actually appears to be doing better in regard to his wound. He has been tolerating the Wound VAC without complication and in fact it has been collecting a significant amount of drainage which I think is good news especially considering how the wound appears. Fortunately there is no signs of infection at this time definitely nothing appears to be worse which is good news. He has not been started on the Bactrim and Flagyl that was recommended by Dr. Delaine Lame yet. I did actually contact her office this morning in order  to check and see were things are that regard their gonna be calling me back. 08/30/18 on evaluation today patient actually appears to show signs of excellent improvement today compared to last evaluation. The undermining is getting much better the wound seems to be feeling quite nicely and I'm very pleased that the granulation in general. With that being said overall I feel like the patient has made excellent progress which is great news. No fevers, chills, nausea, or vomiting noted at this time. 09/06/18 on evaluation today patient actually appears to be doing rather well in regard to his right gluteal ulcer. This is showing signs of improvement in overall I'm very pleased with how things seem to be progressing. The patient likewise is please. Overall I  see no evidence of infection he is about to complete his oral antibiotic regimen which is the end of the antibiotics for him in just about three days. ALLEN, HAASCH ([id number]) 09/13/18 on evaluation today patient's right Ischial ulcer appears to be showing signs of continued improvement which is excellent news. He's been tolerating the dressing changes without complication. Fortunately there's no signs of infection and the wound that seems to be doing very well. 09/28/18 on evaluation today patient appears to be doing rather well in regard to his right Ischial ulcer. He's been tolerating the Wound VAC without complication he knows there's much less drainage than there used to be this obviously is not a bad thing in my pinion. There's no evidence of infection despite the fact is but nothing about it now for several weeks. 10/04/18 on evaluation today patient appears to be doing better in regard to his right Ischial wound. He has been tolerating the Wound VAC without complication and I do believe that the silver nitrate last week was beneficial for him. Fortunately overall there's no evidence of active infection at this time which is great news. No fevers, chills, nausea, or vomiting noted at this time. 10/11/18 on evaluation today patient actually appears to be doing rather well in regard to his Ischial ulcer. He's been tolerating the Wound VAC still without complication I feel like this is doing a good job. No fevers, chills, nausea, or vomiting noted at this time. 11/01/18 on evaluation today patient presents after having not been seen in our clinic for several weeks secondary to the fact that he was on evaluation today patient presents after having not been seen in our clinic for several weeks secondary to the fact that he was in a skilled nursing facility which was on lockdown currently due to the covert 19 national emergency. Subsequently he was discharged from the facility on this past Friday  and subsequently made an appointment to come in to see yesterday. Fortunately there's no signs of active infection at this time which is good news and overall he does seem to have made progress since I last saw. Overall I feel like things are progressing quite nicely. The patient is having no pain. 11/08/18 on evaluation today patient appears to be doing okay in regard to his right gluteal ulcer. He has been utilizing a Wound VAC home health this changing this at this point since he's home from the skilled nursing facility. Fortunately there's no signs of obvious active infection at this time. Unfortunately though there's no obvious active infection he is having some maceration and his wife states that when the sheets of the Wound VAC office on Sunday when it broke seal that he ended up having significant issues with some  smell as well there concerned about the possibility of infection. Fortunately there's No fevers, chills, nausea, or vomiting noted at this time. 11/15/18 on evaluation today patient actually appears to be doing well in regard to his right gluteal ulcer. He has been tolerating the dressing changes without complication. Specifically the Wound VAC has been utilized up to this point. Fortunately there's no signs of infection and overall I feel like he has made progress even since last week when I last saw him. I'm actually fairly happy with the overall appearance although he does seem to have somewhat of a hyper granular overgrowth in the central portion of the wound which I think may require some sharp debridement to try flatness out possibly utilizing chemical cauterization following. 11/23/18 on evaluation today patient actually appears to be doing very well in regard to his sacral ulcer. He seems to be showing signs of improvement with good granulation. With that being said he still has the small area of hyper granulation right in the central portion of the wound which I'm gonna likely  utilize silver nitrate on today. Subsequently he also keeps having a leak at the 6 o'clock location which is unfortunate we may be able to help out with some suggestions to try to prevent this going forward. Fortunately there's no signs of active infection at this time. 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 He's been tolerating the dressing changes without complication. Fortunately there's no signs of active infection at this time. I've been rather pleased with how things have progressed there still some evidence of pressure getting to the area with some redness right around the immediate wound opening. Nonetheless other than this I'm not seeing any significant complications or issues the wound is somewhat hyper granular. Upon discussing with the patient and his wife today I'm not sure that the wound is being packed to the base with the foam at this point. And if it's not been packed fully that may be part of the reason why is not seen as much improvement as far as the granulation from the base out. Again we do not want pack too tightly but we need some of the firm to get to the base of the wound. I discussed this with patient and his wife today. 12/06/18 on evaluation today patient appears to be doing well in regard to his right gluteal pressure ulcer. He's been tolerating the dressing changes without complication. Fortunately there's no signs of active infection. He still has some hyper granular tissue and I do think it would be appropriate to continue with the chemical cauterization as of today. 12/16/18 on evaluation today patient actually appears to be doing okay in regard to his right gluteal ulcer. He is been tolerating the dressing changes without complication including the Wound VAC. Overall I feel like nothing seems to be worsening I do feel like that the hyper granulation buds in the central  portion of the wound have improved to some degree with the silver nitrate. We will have to see how things continue to progress. 12/20/18 on evaluation today patient actually appears to be doing much worse in my pinion even compared to last week's evaluation. Unfortunately as opposed to showing any signs of improvement the areas of hyper granular tissue in the central portion of the wound seem to be getting worse. Subsequently the wound bed itself also seems to be getting deeper even  compared to last week which is both unusual as well as concerning since prior he had been shown signs of improvement. Nonetheless I think that the issue could be that he's actually having some difficulty in issues with a deeper infection. There's no external signs of infection but nonetheless I am more worried about the internal, osteomyelitis, that could be restarting. He has not been on antibiotics for some time at this point. I think that it may be a good idea to go ahead and started back on an antibiotic therapy while we wait to see what the testing shows. 12/27/18 on evaluation today patient presents for follow-up concerning his left gluteal fold wound. Fortunately he appears to be doing well today. I did review the CT scan which was negative for any signs of osteomyelitis or acute abnormality this is excellent news. Overall I feel like the surface of the wound bed appears to be doing significantly better today compared to previously noted findings. There does not appear any signs of infection nor does he have any pain at this time. 01/03/19 on evaluation today patient actually appears to be doing quite well in regard to his ulcer. Post debridement last week he really did not have too much bleeding which is good news. Fortunately today this seems to be doing some better but we still has some of the hyper granular tissue noted in the base of the wound which is gonna require sharp debridement today as well. Overall I'm  pleased with how things seem to be progressing since we switched away from the Wound VAC I think he is making some progress. 01/10/19 on evaluation today patient appears to be doing better in regard to his right gluteal fold ulcer. He has been tolerating the dressing changes without complication. The debridement to seem to be helping with current away some of the poor hyper granular tissue bugs throughout the region of his gluteal fold wound. He's been tolerating the dressing changes otherwise without complication which is great news. No fevers, chills, nausea, or vomiting noted at this time. 01/17/19 on evaluation today patient actually appears to be doing excellent in regard to his wound. He's been tolerating the dressing changes WINSOR, ESS. ([id number]) without complication. Fortunately there is no signs of active infection at this time which is great news. No fevers, chills, nausea, or vomiting noted at this time. 01/24/19 on evaluation today patient actually appears to be doing quite well with regard to his ulcer. He has been tolerating the dressing changes without complication. Fortunately there's no signs of active infection at this time. Overall been very pleased with the progress that he seems to be making currently. 01/31/19-Patient returns at 1 week with apparent similarity in dimensions to the wound, with no signs of infection, he has been changing dressings twice a day 02/08/19 upon evaluation today patient actually appears to be doing well with regard to his right Ischial ulcer. The wound is not appear to be quite as deep and seems to be making progress which is good news. With that being said I'm still reluctant to go back to the Wound VAC at this point. He's been having to change the dressings twice a day which is a little bit much in my pinion from the wound care supplies standpoint. I think that possibly attempting to utilize extras orbit may be beneficial this may also help to  prevent any additional breakdown secondary to fluid retention in the wound itself. The patient is in agreement with giving this  a try. 02/15/19 on evaluation today patient actually appears to be doing decently well with regard to his ulcer in the right to gluteal fold location. He's been tolerating the dressing changes without complication. Fortunately there is no signs of active infection at this time. He is able to keep the current dressing in place more effectively for a day at a time whereas before he was having a changes to to three times a day. The actions or has been helpful in this regard. Fortunately there's no signs of anything getting worse and I do feel like he showing signs of good improvement with regard to the wound bed status. 02/22/2019 patient appears to be doing very well today with regard to his ulcer in the gluteal fold. Fortunately there is no signs of active infection and he has been tolerating the dressing changes without any complication. Overall extremely pleased with how things seem to be progressing. He has much less of the hyper granular projections within the wound these have slowly been debrided away and he seems to be doing well. The wound bed is more uniform. 03/01/19 on evaluation today patient appears to be doing unfortunately about the same in regard to his gluteal ulcer. He's been tolerating the dressing changes without complication. Fortunately there's no signs of active infection at this time. With that being said he continues to develop these hyper granular projections which I'm unsure of exactly what they are and why they are rising. Nonetheless I explained to the patient that I do believe it would be a good idea for [place] to stand a biopsy sample for pathology to see if that can shed any light on what exactly may be going on here. Fortunately I do not see any obvious signs of infection. With that being said the patient has had a little bit more drainage this  week apparently compared to last week. 03/08/2019 on evaluation today patient actually appears to look somewhat better with regard to the appearance of his wound bed at this time. This is good news. Overall I am very pleased with how things seem to have progressed just in the past week with a switch to the [facility] dressing. I think that has been beneficial for him. With that being said at this time the patient is concerned about his biopsy that I sent off last week unfortunately I do not have that report as of yet. Nonetheless we have called to obtain this and hopefully will hear back from the lab later this morning. 03/15/19 on evaluation today patient's wound actually appears to be doing okay today with regard to the overall appearance of the wound bed. He has been tolerating the dressing changes without complication he still has hyper granular tissue noted but fortunately that seems to be minimal at this point compared to some of what we've seen in the past. Nonetheless I do think that he is still having some issues currently with some of this type of granulation the biopsy and since all showed nothing more than just evidence of granulation tissue. Therefore there really is nothing different to initiate or do at this point. 03/24/19 on evaluation today patient appears to be doing a little better with regard to his ulcer. He's been tolerating the dressing changes without complication. Fortunately there is no signs of active infection at this time. No fevers, chills, nausea, or vomiting noted at this time. I'm overall pleased with how things seem to be progressing. 03/29/2019 on evaluation today patient appears to be  doing about the same in regard to his ulcer in the right gluteal fold. Unfortunately he is not seeming to make a lot of progress and the wound is somewhat stalled. There is no signs of active infection externally but I am concerned about the possibility of infection continuing in the  ischial location which previously he did have infection noted. Again is not able to have an MRI so probably our best option for testing for this would be a triple phase bone scan which will detect subtle changes in the bone more so than plain film x-rays. In the past they really have not been beneficial and in fact the CT scans even have been somewhat questionable at times. Nonetheless there is no signs of systemic infection which is at least good news but again his wound is not healing at all on the predicted schedule. 04/05/19 on evaluation today patient appears to be doing well all things considering with regard to his wound and the right gluteal fold. He actually has his triple bone scan scheduled for sometime in the next couple of days. With that being said I've also been looking to other possibilities of what could be causing hyper granular tissue were looking into the bone scan again for evaluating for the risk or possibility of infection deeper and I'm also gonna go ahead and see about obtaining a deep tissue culture today to send and see if there's any evidence of infection noted on culture. He's in agreement with that plan. 04/12/2019 on evaluation today patient presents for reevaluation here in our clinic concerning ongoing issues with his right gluteal fold ulcer. 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 It was discussed with him at that point that I think it would be appropriate for him to proceed with hyperbaric oxygen therapy especially in light of the fact that he is previously been on IV antibiotics at the beginning of the year for close to 3 months followed by several weeks of oral antibiotics that was all prescribed by infectious disease. He had surgical debridement around Christmas December 2019 due to an abscess and osteomyelitis of the ischial bone. Unfortunately  this has not really proceeded as well as we would have liked and again we did a CT scan even a couple months ago as he cannot have an MRI secondary to having issues with both a pain pump as well as a spinal cord stimulator which prevent him from going into an MRI machine. With that being said there were chronic changes noted at the ischial tuberosity which had progressed since December 2019 there was no evidence of fluid collection on that initial CT scan. With that being said that was on January 21, 2019. I am not sure that it was a sensitive enough test as compared to an MRI and then subsequently I ordered a bone scan for him which was actually completed on 03/29/2019 and this revealed that he does indeed have positive osteomyelitis involving the right ischial tuberosity. This is adjacent to the ulcer and I think is the reason that his ulcer is not healing. Subsequently I am in a place him back on oral antibiotics today unfortunately his wound culture showed just mixed gram-negative flora with no specific findings of a predominant organism. Nonetheless being with the location I think a good broad-spectrum antibiotic for gram-negative's is a good choice at this point he is previously  taken Levaquin without significant issues and I think that is an appropriate antibiotic for him at this time. 04/19/2019 on evaluation today patient actually appears to be doing fairly well with regard to his wound. He has been tolerating the dressing changes without complication. Fortunately there is no signs of active infection and he has been taking the oral antibiotics at this time. MILLER, GRIMMETT ([id number]) Subsequently we did make a referral to infectious disease although Dr. Steva Ready wants the patient to be seen by general surgery first in order apparently to see if there is anything from a surgical standpoint that should be done prior to initiation of IV antibiotic therapy. Again the patient is okay with  actually seeing Dr. Celine Ahr whom he has seen before we will make that referral. 04/26/2019 on evaluation today patient actually appears to be doing well with regard to his wound. He has been tolerating the dressing changes without complication. Fortunately there is no signs of active infection at this time. I do believe that the hyperbaric oxygen therapy along with the antibiotics which I prescribed at this point have been doing well for him. With that being said he has not seen Dr. Celine Ahr the surgeon yet in fact they have not been contacted for scheduling an appointment as of yet. Subsequently the patient is not on antibiotics currently by IV Dr. Steva Ready did want him to see Dr. Celine Ahr first which we are working on trying to get scheduled. We again give the information to the patient today for Dr. Celine Ahr and her number as far as contacting their office to see about getting something scheduled. Again were looking for whether or not they recommend any surgical intervention. 05/03/2019 on evaluation today patient appears to be doing about the same with regard to his wound there may be a little bit of filling in in regard to the base of the wound but still he has quite a bit of hyper granular tissue that I am seeing today. I believe that he may may need a more aggressive sharp debridement possibly even by the surgeon or under anesthesia in order to clear away some of the hyper granular material in order to help allow for appropriate granulation to fill in. We have made a referral to Dr. Celine Ahr unfortunately it sounds as if they did not receive the referral we contacted them today they should be get in touch with the patient. Depending on how things show from that standpoint the patient may need to see Dr. Ardyth Gal who is the infectious disease specialist although he really does not want a PICC line again. No fevers, chills, nausea, vomiting, or diarrhea. He is tolerating hyperbaric oxygen therapy very  well. 05/12/2019 on evaluation today patient presents for follow-up visit concerning his right gluteal fold ulcer. He did see Dr. Celine Ahr the surgeon who previously evaluated his wound and she actually felt like he was doing quite well with regard to the wound based on what she was seen. He does seem to be responding to some degree with the oral antibiotics along with hyperbaric oxygen therapy at this point she did not see any evidence or need for further surgical intervention at this point. She recommended deferring additional or ongoing antibiotic therapy to Dr. Steva Ready at her discretion. Fortunately there is no signs of active systemic infection. No fevers, chills, nausea, vomiting, or diarrhea. 10/29; patient I am seeing really for the first time today in terms of his wound. 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 He is being treated with hyperbaric oxygen for underlying osteomyelitis most recently documented by a three-phase bone scan. He has been on Levaquin for roughly a month and he is out of these and asked me for a refill. Most recent cultures were negative although I do not think these were bone cultures. He has a very irregular surface to this wound which is almost nodular in texture. It undermines superiorly with the same nodular hyper granulated surface. I see that he was referred to general surgery for an operative debridement although the surgeon did not agree with this I think that would have been the proper course of action in this. He has been using Hydrofera Blue. He is supposed to start a wound VAC tomorrow which is actually a reapplication of wound VAC. I think mostly last time they had trouble keeping the seal in place I hope we have better look at this this time. 05/24/2019 on evaluation today I did review patient's note from Dr. Dellia Nims where he was seen last week when I was out of the office. Subsequently it does appear that Dr. Dellia Nims was in  agreement that the patient really needed debridement to clear away some of the nodular tissue in the base of the wound. The good news is he has the wound VAC initiated and some of this tissue is already starting to breakdown under the treatment of the wound VAC again because it is not really viable. Nonetheless I do believe that we can likely perform some debridement today to clear this away and hopefully the continuation of the wound VAC along with hyperbarics and antibiotic therapy will be beneficial in stopping this from reoccurring. If we get better control in the wound bed in a good spot I think we can definitely use the PriMatrix which we actually did get approval for. 05/31/2019 on evaluation today patient appears to be doing actually pretty well at this point with regard to his wound. He has been tolerating the dressing changes which is good news. Fortunately there is no signs of infection. He still has some of the hyper granular nodules noted we have not really cleared all these away and I think we can try to do a little bit more that today. I did do quite a bit last week I am hoping to get down to the base of the wound so though actually be able to heal more appropriately. I think the wound VAC is doing a good job right now with controlling the drainage and overall the patient is happy with that. 06/07/2019 upon evaluation today patient appears to be doing quite well compared to last week with regard to the hyper granulation in the base of the wound. He has been tolerating the dressing changes without complication. Fortunately there is no signs of active infection at this time. With that being said we have not had any recent measures as far as blood work is concerned I think we may want to check a few things including a CBC, sed rate, and C-reactive protein. Patient is in agreement with that we can try to get that scheduled for him for this Friday. 06/13/2019 on evaluation today patient  actually appears to be doing much better at this point based on what I am seeing with regard to his wound. He has much less in the way of hyper granular tissue which is good news and a lot more healing that is taken place since I last saw him as far as  the amount of space within the wound itself. With that being said he is nearing the completion of the initial 40 treatments for hyperbaric oxygen therapy I think this is something I would recommend extending as he does seem to be benefiting at this time. Fortunately there is no evidence of active systemic infection at this point nor even local infection for that matter. 06/20/2019 upon evaluation today patient actually appears to be doing well with regard to his wound. In fact this appears to be doing much better today compared to what it was previous. I been extremely pleased with the progress that he is made. In fact the tunnel is much less deep today than what it was in the past and I am seeing excellent signs of improvement. Overall I am happy with how things are going. I did review his white blood cell count which revealed everything to be okay. The one abnormal finding on his CBC was a hemoglobin of 12.7. Subsequently I did also review his sed rate and C-reactive protein. His sed rate measured in at normal level measuring at 26. The C-reactive protein however was elevated today and an abnormal range indicating inflammation. Still I feel like he is trending in a good direction at this time. He is going to need a refill of the Levaquin he tells me at this point. 07/04/2019 on evaluation today patient actually appears to be doing well in regard to his wound. He has been tolerating the dressing changes without complication. Fortunately there is no signs of active infection which is good news. No fevers, chills, nausea, vomiting, or diarrhea. The good news also is that I did review his labs and though his sed rate was slightly elevated the C-reactive  protein was actually down compared to the last evaluation 2 weeks ago this is excellent news I think it is showing signs of improvement. 07/11/2019 on evaluation today patient actually appears to be doing quite well with regard to his wound on my evaluation. This is very slow going but nonetheless he seems to be making progress. Especially in regard to the depth. Nonetheless I believe that we may want to consider going forward with the PriMatrix which was previously approved for him. We will get a have to get a new approval as we will actually be applying this in the new year so we will likely obtain that approval on January 4. In the meantime and for the time being my suggestion is going to be that we go ahead and continue with the wound VAC and we were going at this point with the current wound care measures. 12/28-Patient seen after HBO session today, wound depth with the tunnel measures 3.2 cm, the wound bed appears healthy, continuing with the VAC and HBO Donze, Khiem J. ([id number]) 07/25/2019 on evaluation today patient's wound appears to be doing about the same compared to my last evaluation. Fortunately there is no evidence of significant infection which is good news we will still waiting on the results of his lab work which unfortunately was not done prior to today's visit which is what we were aiming for. Ultimately there is no sign of active infection at this time which is good news. And he also tells me that the wound VAC has been staying in place and doing fairly well which is also good news. With that being said I am concerned about the fact that the wound has not progressed as much I think we may want to look into getting reapproval  for the skin substitute, specifically PriMatrix, that I am hopeful will help to allow this area to granulate in more effectively. I would recommend as well continuing with the wound VAC along with this. 08/01/2019 on evaluation today patient appears to  be doing in my opinion slightly better in regard to the wound. He actually does have some tightening of the walls around the edges which is a good thing. Fortunately there is no signs of active infection at this time. No fever chills noted. I do believe that the patient in general seems to be making progress although is very slow. Obviously were trying to get things to speed up somewhat more. No fevers, chills, nausea, vomiting, or diarrhea. 08/08/2019 upon evaluation today patient appears to be doing well with regard to his wound although there is not a significant improvement overall we do have the PriMatrix for availability today to apply. Fortunately there is no signs of active infection and the patient overall seems to be doing well. There is really no need for aggressive sharp debridement at this point either which is also good news. I do believe her get a need to suture and the primary checks in the basin in the PriMatrix in order to keep it in place at the base of the wound underneath the wound VAC. This was discussed with the patient today as well. He is in agreement the plan. He really does not have any filling in the area and so this should not be a complication as far as suturing is concerned. 08/15/2019 on evaluation today patient appears to be doing somewhat better in my opinion in regard to the overall appearance of his wound. He has been tolerating the dressing changes without complication. Fortunately the skin subappear to still be in place when this was changed on Friday. With that being said it appears that the PriMatrix may have dissolved in the interim between now and then as the sutures are no longer holding anything in place and again I would sutured it into place. Nonetheless I believe that we can go ahead and likely consider going forward that we may need to apply the PriMatrix weekly as opposed every other week as it does seem to have dissolved rather readily and well.  Overall the wound bed appears to be doing excellent at this time. 08/22/2019 upon evaluation today the patient does seem to making some progress with regard to his wound. Overall there is some better quality tissue and less of the hyper granular protrusions all of which is good news. With that being said we do have the PriMatrix available for application today and I am in a try to find a better way to suture this in place for him today. 08/29/2019 on evaluation today patient appears to be doing very well in regard to his wound. I do see signs of new granulation which seems to be somewhat helpful he did have some need for sharp debridement today to clear some of the hyper granulation away but nonetheless I feel like we are headed in a good direction. Fortunately there is no evidence of any systemic infection although we are going to go ahead and reorder the CBC, C-reactive protein, and sed rate today to see where things stand I did just refill his Levaquin as well. 09/05/2019 upon evaluation today patient's wound actually is not appearing to be showing signs of significant improvement compared to what we have seen previous from the standpoint of depth. He overall wound size wise seems  to be about the same and again some of the quality of the wound bed is still doing fairly well. I did review his labs and his CBC was normal with no signs of elevation white blood cell count, his C-reactive protein was normal, and the sed rate was slightly elevated but again I think this may just be due to ulceration really there is nothing significant to indicate severe infection. I think when he likely finishes his current round of oral antibiotics with Levaquin will likely take him off of the medication at that point based on the findings. I do feel like the hyperbarics helped in getting the infection under control as well as improving the overall size of the wound quite significantly. With that being said at this point I  really feel like more more at the time frame that he may benefit from a muscle flap in order to try to help this wound heal effectively. I think that this would be the fastest way in order to do this. In the past he has been somewhat resistant to going down that path but nonetheless I feel like he likely should. 09/12/2019 on evaluation today patient appears to be doing fairly well in regard to his wound. He is tolerating the wound VAC without complication is still continues to drain significantly. Fortunately there is no signs of active infection at this time which is great news his lab work is also been doing very well. With that being said I do think at this point that the patient is still deriving benefit from the wound VAC especially in regard to controlling moisture which I think it is doing fairly well. He also has some improvement in the overall wound bed and on the medial aspect of the wound there are some hyper granular projections but nothing as severe as what they have been in the past. I am good have to perform some sharp debridement at this location today. 09/19/2019 upon evaluation today patient appears to be doing roughly the same although again the tissue does seem to be showing some signs of improvement which is good news. There is less hyper granular budding. Nonetheless he still is going require some sharp debridement today. We did get his albumin and prealbumin results back and they are within normal limits in both cases which is excellent news that was the one thing [facility] plastic surgery was waiting on order to get him scheduled. He does seem to be a good candidate physically for the flap surgery if he decides to go that direction. With that being said is still left to be determined once they see the wound what they feel about how best to proceed. Obviously in the end it would be up to Mr. Vitucci as to whether he goes forward with the flap surgery or something else depending on  the recommendations. 09/26/2019 upon evaluation today patient appears to be doing about the same in regard to his wound. Fortunately there is no signs of active infection there is also not as much hyper granulation noted today in fact I do not believe there is can be any sharp debridement required at this point. With that being said I do believe he continues to be somewhat stalled at this point with regard to his wound. There is no signs of active infection at this time which is good news. He does have an appointment with [facility] plastic surgery on Friday. 10/03/2019 upon evaluation today patient appears to be doing somewhat poorly especially in  his overall demeanor compared to last week's evaluation. He unfortunately did see the physician at [facility] plastic surgery. Unfortunately the plan for the possibility of a flap would be a fairly significant surgery that the patient is really not wanting to take the risk of as if the surgery failed he would end up with a very large open wound at that point. Fortunately there is no signs of active infection at this time. No fevers, chills, nausea, vomiting, or diarrhea. With that being said there was some question that the physician had about why this would not be healing and the possibility that there may be some bone still needs to be removed deeper in the wound. Again the bone scan that we did previous actually did show evidence of there still being a chronic osteomyelitis back on March 29, 2019. With that being said he did undergo treatment both with hyperbarics as well as oral antibiotics for quite some time since that point. Obviously he may need additional and updated imaging with the surgeon in order to see if there is anything else that can or will need to be done in order to help this area to heal. Obviously I still believe as well that there is something going on here. He is already seeing the surgeon that performed the original surgery after the bone scan  but they did not feel that anything needed to be done from a surgical standpoint at that time. 10/10/2019 upon evaluation today patient appears to be really doing about the same with regard to his wound in the gluteal fold region. SHI, BAYERL ([id number]) Unfortunately he is not making a lot of improvement the insurance also contacted home health and apparently the wound VAC is no longer to be covered due to lack of progress. Obviously that is something that we discussed as well as a strong possibility for happening soon and this was even last week that discussed this with him. With that being said again we have tried to manage this wound as best as I could up to this point. Fortunately there is no evidence of active infection at this time. No fevers, chills, nausea, vomiting, or diarrhea. 10/17/2019 upon evaluation today patient has decided to proceed with the flap surgery. He actually seems to be in better spirits today he states he wants to get this healed and after talking to the surgeon last week he feels like that is his best option for doing so. With that being said he also needs to have a referral to infectious disease to be seen for IV antibiotics preoperatively and postoperatively as far as the skin flap is concerned. He wonders if Dr. Steva Ready would be interested or available to do this for him. I explained that I would definitely have to check with her and I will be happy to do that but I am not certain What her answer will be in 2 I have a discussion with her. 10/24/2019 upon evaluation today patient appears to be doing about the same with regard to his wound although he is complaining of increased pain he is really not had any discomfort he states that it is getting much worse at this point. Fortunately there is no signs of active infection. At least not systemically. With that being said I question whether local infection could be a problem here secondary to the fact that to be  honest the patient is having pain where he has not previous. 10/31/2019 upon evaluation today patient appears to be doing  about the same in regard to his wound in the right gluteal fold region. Subsequently in the interim since I last saw him last week I did receive a message from Dr. Steva Ready as well as reviewed her note. She was requesting the potential for a bone culture or at least a deep tissue culture in order to see about helping with more directed antibiotic therapy for the patient's projected flap surgery. If we could not obtain more specific organisms for treatment then obviously should be looking at more broad-spectrum antibiotic therapy. With that being said at this point the patient seems ready to move forward with the flap surgery. I did actually sign the paperwork for his bed which came through last week I believe that was on Thursday or Friday. Either way that should be coming to him as well so he will have that in place once the surgery occurs when he is discharged home. Fortunately there is no signs of active infection at this time. No fevers, chills, nausea, vomiting, or diarrhea. 11/15/2019 upon evaluation today patient appears to be doing about the same with regard to his right gluteal fold ulcer. He has been tolerating the dressing changes without complication right now he is awaiting the hospital bed with air mattress in order to be able to be scheduled for his flap surgery. He is on IV vancomycin at this point is managed by Dr. Steva Ready. Objective Constitutional Well-nourished and well-hydrated in no acute distress. Vitals Time Taken: 3:40 PM, Height: 73 in, Weight: 210 lbs, BMI: 27.7, Temperature: 98.4 F, Pulse: 53 bpm, Respiratory Rate: 16 breaths/min, Blood Pressure: 123/69 mmHg. Respiratory normal breathing without difficulty. Psychiatric this patient is able to make decisions and demonstrates good insight into disease process. Alert and Oriented x 3. pleasant and  cooperative. General Notes: Upon inspection patient's wound bed actually showed signs of good granulation at this time. Fortunately there is no signs of severe infection although he did have hyper granular areas as well. Unfortunately I really do not feel like this is making any progress he does have a plan but right now this plan is depending upon him getting approval for the air mattress which him and his wife are still working on. Integumentary (Hair, Skin) Wound #1 status is Open. Original cause of wound was Pressure Injury. The wound is located on the Right Gluteal fold. The wound measures 3.4cm length x 2cm width x 1.8cm depth; 5.341cm^2 area and 9.613cm^3 volume. There is Fat Layer (Subcutaneous Tissue) Exposed exposed. There is tunneling at 12:00 with a maximum distance of 3.2cm. There is a large amount of serous drainage noted. The wound margin is epibole. There is large (67-100%) pink, hyper - granulation within the wound bed. There is a small (1-33%) amount of necrotic tissue within the wound bed including Adherent Slough. 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 ([id number]) Essential (primary) hypertension Plan Wound Cleansing: Wound #1 Right Gluteal fold: Clean wound with Normal Saline. - in office Cleanse wound with mild soap and water Anesthetic (add to Medication List): Wound #1 Right Gluteal fold: Topical Lidocaine 4% cream applied to wound bed prior to debridement (In Clinic Only). Skin Barriers/Peri-Wound Care: Skin Prep Primary Wound Dressing: Wound #1 Right Gluteal fold: Silver Alginate Secondary Dressing: Wound #1 Right Gluteal fold: Tegaderm XtraSorb Dressing Change Frequency: Wound #1 Right Gluteal fold: Change dressing  every other  day. Follow-up Appointments: Wound #1 Right Gluteal fold: Return Appointment in 2 weeks. Off-Loading: Wound #1 Right Gluteal fold: Turn and reposition every 2 hours - no pressure on wounded area Home Health: Wound #1 Right Gluteal fold: Continue Home Health Visits - [place] Nurse may visit PRN to address patient s wound care needs. FACE TO FACE ENCOUNTER: MEDICARE and MEDICAID PATIENTS: I certify that this patient is under my care and that I had a face-to-face encounter that meets the physician face-to-face encounter requirements with this patient on this date. The encounter with the patient was in whole or in part for the following MEDICAL CONDITION: (primary reason for [place]) MEDICAL NECESSITY: I certify, that based on my findings, NURSING services are a medically necessary home health service. HOME BOUND STATUS: I certify that my clinical findings support that this patient is homebound (i.e., Due to illness or injury, pt requires aid of supportive devices such as crutches, cane, wheelchairs, walkers, the use of special transportation or the assistance of another person to leave their place of residence. There is a normal inability to leave the home and doing so requires considerable and taxing effort. Other absences are for medical reasons / religious services and are infrequent or of short duration when for other reasons). If current dressing causes regression in wound condition, may D/C ordered dressing product/s and apply Normal Saline Moist Dressing daily until next [place] / Other MD appointment. [place] of regression in wound condition at [phone]. Please direct any NON-WOUND related issues/requests for orders to patient's Primary Care Physician 1. My suggestion at this time is can be that we go ahead and initiate treatment with a continuation of the previous orders. He has been utilizing the silver alginate which seems to  have done well up to this time. 2. I am also can recommend currently that he continue with appropriate offloading. Obviously the more that he can keep off of this area the better. We will see patient back for reevaluation in 2 weeks here in the clinic. If anything worsens or changes patient will contact our office for additional recommendations. Electronic Signature(s) Signed: 11/15/2019 6:07:02 PM By: Worthy Keeler [name] Entered By: Worthy Keeler on 11/15/2019 18:07:02 JIHAN, LAUBY ([id number]) -------------------------------------------------------------------------------- SuperBill Details Patient Name: [name] Date of Service: 11/15/2019 Medical Record Number: [id number] Patient Account Number: [id number] Date of Birth/Sex: June 29, 1951 (69 y.o. 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, sequela I10 Essential (primary) hypertension Facility Procedures CPT4 Code: [id number] Description: 99213 - WOUND CARE VISIT-LEV 3 EST PT Modifier: Quantity: 1 Physician Procedures CPT4 [id number] Description: 99213 - WC PHYS LEVEL 3 - EST PT Modifier: Quantity: 1 CPT4 Code: Description: ICD-10 Diagnosis Description L89.314 Pressure ulcer of right buttock, stage 4 M86.68 Other chronic osteomyelitis, other site L03.317 Cellulitis of buttock G82.20 Paraplegia, unspecified Modifier: Quantity: Electronic Signature(s) Signed: 11/15/2019 6:07:16 PM By: Worthy Keeler [name] Entered By: Worthy Keeler on 11/15/2019 18:07:16

## 2019-11-16 NOTE — Progress Notes (Addendum)
AIKEN, ARAGONA ([id number]) Visit Report for 11/15/2019 Arrival Information Details Patient Name: [name], [name] Date of Service: 11/15/2019 3:30 PM Medical Record Number: [id number] Patient Account Number: [id number] Date of Birth/Sex: 11/01/1950 (69 y.o. M) Treating RN: Montey Hora Primary Care Brian Kocourek: [name] Other Clinician: Referring Alize Acy: [name] Treating Delma Villalva/Extender: [name], HOYT Weeks in Treatment: 63 Visit Information History Since Last Visit Added or deleted any medications: No Patient Arrived: Wheel Chair Any new allergies or adverse reactions: No Arrival Time: 15:38 Had a fall or experienced change in No Accompanied By: wife activities of daily living that may affect Transfer Assistance: None risk of falls: Patient Identification Verified: Yes Signs or symptoms of abuse/neglect since last visito No Secondary Verification Process Completed: Yes Hospitalized since last visit: No Patient Requires Transmission-Based Precautions: No Implantable device outside of the clinic excluding No Patient Has Alerts: Yes cellular tissue based products placed in the center Patient Alerts: NOT Diabetic since last visit: Has Dressing in Place as Prescribed: Yes Pain Present Now: Yes Electronic Signature(s) Signed: 11/15/2019 4:08:23 PM By: Lorine Bears RCP, RRT, CHT Entered By: Lorine Bears on 11/15/2019 15:43:46 Neal, [name] -------------------------------------------------------------------------------- Clinic Level of Care Assessment Details Patient Name: [name] Date of Service: 11/15/2019 3:30 PM Medical Record Number: [id number] Patient Account Number: [id number] Date of Birth/Sex: 19-Apr-1951 (69 y.o. M) Treating RN: Montey Hora Primary Care Karlissa Aron: [name] Other Clinician: Referring Mystery Schrupp: [name] Treating Yuliza Cara/Extender: [name],  HOYT Weeks in Treatment: 102 Clinic Level of Care Assessment Items TOOL 4 Quantity Score []  - Use when only an EandM is performed on FOLLOW-UP visit 0 ASSESSMENTS - Nursing Assessment / Reassessment X - Reassessment of Co-morbidities (includes updates in patient status) 1 10 X- 1 5 Reassessment of Adherence to Treatment Plan ASSESSMENTS - Wound and Skin Assessment / Reassessment X - Simple Wound Assessment / Reassessment - one wound 1 5 []  - 0 Complex Wound Assessment / Reassessment - multiple wounds []  - 0 Dermatologic / Skin Assessment (not related to wound area) ASSESSMENTS - Focused Assessment []  - Circumferential Edema Measurements - multi extremities 0 []  - 0 Nutritional Assessment / Counseling / Intervention []  - 0 Lower Extremity Assessment (monofilament, tuning fork, pulses) []  - 0 Peripheral Arterial Disease Assessment (using hand held doppler) ASSESSMENTS - Ostomy and/or Continence Assessment and Care []  - Incontinence Assessment and Management 0 []  - 0 Ostomy Care Assessment and Management (repouching, etc.) PROCESS - Coordination of Care X - Simple Patient / Family Education for ongoing care 1 15 []  - 0 Complex (extensive) Patient / Family Education for ongoing care X- 1 10 Staff obtains Programmer, systems, Records, Test Results / Process Orders []  - 0 Staff telephones HHA, Nursing Homes / Clarify orders / etc []  - 0 Routine Transfer to another Facility (non-emergent condition) []  - 0 Routine Hospital Admission (non-emergent condition) []  - 0 New Admissions / Biomedical engineer / Ordering NPWT, Apligraf, etc. []  - 0 Emergency Hospital Admission (emergent condition) X- 1 10 Simple Discharge Coordination []  - 0 Complex (extensive) Discharge Coordination PROCESS - Special Needs []  - Pediatric / Minor Patient Management 0 []  - 0 Isolation Patient Management []  - 0 Hearing / Language / Visual special needs []  - 0 Assessment of Community assistance  (transportation, D/C planning, etc.) []  - 0 Additional assistance / Altered mentation X- 1 15 Support Surface(s) Assessment (bed, cushion, seat, etc.) INTERVENTIONS - Wound Cleansing / Measurement Helbert, Zuriel J. ([id number]) X- 1 5 Simple Wound  Cleansing - one wound []  - 0 Complex Wound Cleansing - multiple wounds X- 1 5 Wound Imaging (photographs - any number of wounds) []  - 0 Wound Tracing (instead of photographs) X- 1 5 Simple Wound Measurement - one wound []  - 0 Complex Wound Measurement - multiple wounds INTERVENTIONS - Wound Dressings X - Small Wound Dressing one or multiple wounds 1 10 []  - 0 Medium Wound Dressing one or multiple wounds []  - 0 Large Wound Dressing one or multiple wounds []  - 0 Application of Medications - topical []  - 0 Application of Medications - injection INTERVENTIONS - Miscellaneous []  - External ear exam 0 []  - 0 Specimen Collection (cultures, biopsies, blood, body fluids, etc.) []  - 0 Specimen(s) / Culture(s) sent or taken to Lab for analysis []  - 0 Patient Transfer (multiple staff / Civil Service fast streamer / Similar devices) []  - 0 Simple Staple / Suture removal (25 or less) []  - 0 Complex Staple / Suture removal (26 or more) []  - 0 Hypo / Hyperglycemic Management (close monitor of Blood Glucose) []  - 0 Ankle / Brachial Index (ABI) - do not check if billed separately X- 1 5 Vital Signs Has the patient been seen at the hospital within the last three years: Yes Total Score: 100 Level Of Care: New/Established - Level 3 Electronic Signature(s) Signed: 11/15/2019 4:58:50 PM By: Montey Hora Entered By: Montey Hora on 11/15/2019 16:32:38 [name] ([id number]) -------------------------------------------------------------------------------- Encounter Discharge Information Details Patient Name: [name] Date of Service: 11/15/2019 3:30 PM Medical Record Number: [id number] Patient Account Number: [id number] Date of  Birth/Sex: Mar 29, 1951 (69 y.o. M) Treating RN: Montey Hora Primary Care [name] Mathe: [name] Other Clinician: Referring Howard Patton: [name] Treating Aaron Bostwick/Extender: [name], HOYT Weeks in Treatment: 102 Encounter Discharge Information Items Discharge Condition: Stable Ambulatory Status: Wheelchair Discharge Destination: Home Transportation: Private Auto Accompanied By: wife Schedule Follow-up Appointment: Yes Clinical Summary of Care: Electronic Signature(s) Signed: 11/15/2019 4:58:50 PM By: Montey Hora Entered By: Montey Hora on 11/15/2019 16:30:43 [name] ([id number]) -------------------------------------------------------------------------------- Lower Extremity Assessment Details Patient Name: [name] Date of Service: 11/15/2019 3:30 PM Medical Record Number: [id number] Patient Account Number: [id number] Date of Birth/Sex: 1951/06/23 (69 y.o. M) Treating RN: Army Melia Primary Care Akiyah Eppolito: [name] Other Clinician: Referring Kinesha Auten: [name] Treating Vandy Fong/Extender: [name], HOYT Weeks in Treatment: 102 Electronic Signature(s) Signed: 11/15/2019 4:32:17 PM By: Army Melia Entered By: Army Melia on 11/15/2019 15:48:44 QUENTEN, STEERS ([id number]) -------------------------------------------------------------------------------- Multi Wound Chart Details Patient Name: [name] Date of Service: 11/15/2019 3:30 PM Medical Record Number: [id number] Patient Account Number: [id number] Date of Birth/Sex: [birth year] (69 y.o. M) Treating RN: Montey Hora Primary Care Avari Nevares: [name] Other Clinician: Referring Jahlani Lorentz: [name] Treating Carmen Tolliver/Extender: [name], HOYT Weeks in Treatment: 102 Vital Signs Height(in): 73 Pulse(bpm): 53 Weight(lbs): 210 Blood Pressure(mmHg): 123/69 Body Mass Index(BMI): 28 Temperature(F): 98.4 Respiratory Rate(breaths/min):  16 Photos: [N/A:N/A] Wound Location: Right Gluteal fold N/A N/A Wounding Event: Pressure Injury N/A N/A Primary Etiology: Pressure Ulcer N/A N/A Comorbid History: Cataracts, Hypertension, Paraplegia N/A N/A Date Acquired: 11/02/2017 N/A N/A Weeks of Treatment: 102 N/A N/A Wound Status: Open N/A N/A Measurements L x W x D (cm) 3.4x2x1.8 N/A N/A Area (cm) : 5.341 N/A N/A Volume (cm) : 9.613 N/A N/A % Reduction in Area: 46.90% N/A N/A % Reduction in Volume: -856.50% N/A N/A Position 1 (o'clock): 12 Maximum Distance 1 (cm): 3.2 Tunneling: Yes N/A N/A Classification: Category/Stage IV N/A N/A Exudate Amount: Large  N/A N/A Exudate Type: Serous N/A N/A Exudate Color: amber N/A N/A Wound Margin: Epibole N/A N/A Granulation Amount: Large (67-100%) N/A N/A Granulation Quality: Pink, Hyper-granulation N/A N/A Necrotic Amount: Small (1-33%) N/A N/A Exposed Structures: Fat Layer (Subcutaneous Tissue) N/A N/A Exposed: Yes Fascia: No Tendon: No Muscle: No Joint: No Bone: No Epithelialization: Small (1-33%) N/A N/A Treatment Notes Electronic Signature(s) Signed: 11/15/2019 4:58:50 PM By: Montey Hora Entered By: Montey Hora on 11/15/2019 16:26:07 [name] ([id number]) -------------------------------------------------------------------------------- [employer] Details Patient Name: [name] Date of Service: 11/15/2019 3:30 PM Medical Record Number: [id number] Patient Account Number: [id number] Date of Birth/Sex: September 27, 1950 (69 y.o. M) Treating RN: Montey Hora Primary Care Andru Genter: [name] Other Clinician: Referring Jericka Kadar: [name] Treating Fowler Antos/Extender: [name], HOYT Weeks in Treatment: 102 Active Inactive Osteomyelitis Nursing Diagnoses: Infection: osteomyelitis Goals: Patient's osteomyelitis will resolve Date Initiated: 08/29/2019 Target Resolution Date: 09/28/2019 Goal Status: Active Signs and symptoms  for osteomyelitis will be recognized and promptly addressed Date Initiated: 08/29/2019 Target Resolution Date: 09/28/2019 Goal Status: Active Interventions: Assess for signs and symptoms of osteomyelitis resolution every visit Treatment Activities: Systemic antibiotics : 08/29/2019 Notes: Pressure Nursing Diagnoses: Knowledge deficit related to management of pressures ulcers Goals: Patient/caregiver will verbalize understanding of pressure ulcer management Date Initiated: 05/17/2018 Target Resolution Date: 05/28/2018 Goal Status: Active Interventions: Assess: immobility, friction, shearing, incontinence upon admission and as needed Notes: Wound/Skin Impairment Nursing Diagnoses: Impaired tissue integrity Goals: Patient/caregiver will verbalize understanding of skin care regimen Date Initiated: 11/30/2017 Target Resolution Date: 12/21/2017 Goal Status: Active Ulcer/skin breakdown will have a volume reduction of 30% by week 4 Date Initiated: 11/30/2017 Target Resolution Date: 12/21/2017 Goal Status: Active Interventions: Assess patient/caregiver ability to obtain necessary supplies Assess patient/caregiver ability to perform ulcer/skin care regimen upon admission and as needed Assess ulceration(s) every visit NOBUO, ROSETE ([id number]) Treatment Activities: Skin care regimen initiated : 11/30/2017 Notes: Electronic Signature(s) Signed: 11/15/2019 4:58:50 PM By: Montey Hora Entered By: Montey Hora on 11/15/2019 16:25:56 ERACLIO, LORD ([id number]) -------------------------------------------------------------------------------- Pain Assessment Details Patient Name: [name] Date of Service: 11/15/2019 3:30 PM Medical Record Number: [id number] Patient Account Number: [id number] Date of Birth/Sex: Jun 27, 1951 (69 y.o. M) Treating RN: Army Melia Primary Care Yeiden Frenkel: [name] Other Clinician: Referring Azriel Jakob: [name] Treating  Ramirez Fullbright/Extender: [name], HOYT Weeks in Treatment: 102 Active Problems Location of Pain Severity and Description of Pain Patient Has Paino No Site Locations Pain Management and Medication Current Pain Management: Electronic Signature(s) Signed: 11/15/2019 4:32:17 PM By: Army Melia Entered By: Army Melia on 11/15/2019 15:48:31 [name] ([id number]) -------------------------------------------------------------------------------- Patient/Caregiver Education Details Patient Name: [name] Date of Service: 11/15/2019 3:30 PM Medical Record Number: [id number] Patient Account Number: [id number] Date of Birth/Gender: [birth year] (69 y.o. M) Treating RN: Montey Hora Primary Care Physician: [name] Other Clinician: Referring Physician: [name] Treating Physician/Extender: Sharalyn Ink in Treatment: 102 Education Assessment Education Provided To: Patient Education Topics Provided Wound/Skin Impairment: Handouts: Other: wound care as ordered Methods: Demonstration, Explain/Verbal Responses: State content correctly Electronic Signature(s) Signed: 11/15/2019 4:58:50 PM By: Montey Hora Entered By: Montey Hora on 11/15/2019 16:33:28 [name] ([id number]) -------------------------------------------------------------------------------- Wound Assessment Details Patient Name: [name] Date of Service: 11/15/2019 3:30 PM Medical Record Number: [id number] Patient Account Number: [id number] Date of Birth/Sex: Aug 04, 1950 (69 y.o. M) Treating RN: Army Melia Primary Care Shenice Dolder: [name] Other Clinician: Referring Kymere Fullington: [name] Treating Oceana Walthall/Extender: [name], HOYT Weeks in Treatment: 102 Wound Status Wound  Number: 1 Primary Etiology: Pressure Ulcer Wound Location: Right Gluteal fold Wound Status: Open Wounding Event: Pressure Injury Comorbid History: Cataracts, Hypertension,  Paraplegia Date Acquired: 11/02/2017 Weeks Of Treatment: 102 Clustered Wound: No Photos Wound Measurements Length: (cm) 3.4 % R Width: (cm) 2 % R Depth: (cm) 1.8 Epi Area: (cm) 5.341 Tu Volume: (cm) 9.613 eduction in Area: 46.9% eduction in Volume: -856.5% thelialization: Small (1-33%) nneling: Yes Position (o'clock): 12 Maximum Distance: (cm) 3.2 Wound Description Classification: Category/Stage IV Fou Wound Margin: Epibole Slo Exudate Amount: Large Exudate Type: Serous Exudate Color: amber l Odor After Cleansing: No ugh/Fibrino Yes Wound Bed Granulation Amount: Large (67-100%) Exposed Structure Granulation Quality: Pink, Hyper-granulation Fascia Exposed: No Necrotic Amount: Small (1-33%) Fat Layer (Subcutaneous Tissue) Exposed: Yes Necrotic Quality: Adherent Slough Tendon Exposed: No Muscle Exposed: No Joint Exposed: No Bone Exposed: No Treatment Notes Wound #1 (Right Gluteal fold) Notes S.cell, xsorb, tegaderm MEER, SCHUERGER ([id number]) Electronic Signature(s) Signed: 11/15/2019 4:32:17 PM By: Army Melia Entered By: Army Melia on 11/15/2019 15:51:57 JORREL, CRIPE ([id number]) -------------------------------------------------------------------------------- Vitals Details Patient Name: [name] Date of Service: 11/15/2019 3:30 PM Medical Record Number: [id number] Patient Account Number: [id number] Date of Birth/Sex: 1951/07/20 (69 y.o. M) Treating RN: Montey Hora Primary Care Kacyn Souder: [name] Other Clinician: Referring Ariyannah Pauling: [name] Treating Zarianna Dicarlo/Extender: [name], HOYT Weeks in Treatment: 102 Vital Signs Time Taken: 15:40 Temperature (F): 98.4 Height (in): 73 Pulse (bpm): 53 Weight (lbs): 210 Respiratory Rate (breaths/min): 16 Body Mass Index (BMI): 27.7 Blood Pressure (mmHg): 123/69 Reference Range: 80 - 120 mg / dl Electronic Signature(s) Signed: 11/15/2019 4:08:23 PM By: Lorine Bears RCP, RRT, CHT Entered By: Lorine Bears on 11/15/2019 15:44:55

## 2019-11-29 ENCOUNTER — Ambulatory Visit: Payer: Medicare Other | Admitting: Physician Assistant

## 2019-11-30 ENCOUNTER — Encounter: Payer: Medicare Other | Attending: Internal Medicine | Admitting: Internal Medicine

## 2019-11-30 ENCOUNTER — Other Ambulatory Visit: Payer: Self-pay

## 2019-11-30 DIAGNOSIS — S34109S Unspecified injury to unspecified level of lumbar spinal cord, sequela: Secondary | ICD-10-CM | POA: Diagnosis not present

## 2019-11-30 DIAGNOSIS — L89314 Pressure ulcer of right buttock, stage 4: Secondary | ICD-10-CM | POA: Diagnosis not present

## 2019-11-30 DIAGNOSIS — M8668 Other chronic osteomyelitis, other site: Secondary | ICD-10-CM | POA: Insufficient documentation

## 2019-11-30 DIAGNOSIS — Y839 Surgical procedure, unspecified as the cause of abnormal reaction of the patient, or of later complication, without mention of misadventure at the time of the procedure: Secondary | ICD-10-CM | POA: Insufficient documentation

## 2019-11-30 DIAGNOSIS — I10 Essential (primary) hypertension: Secondary | ICD-10-CM | POA: Diagnosis not present

## 2019-11-30 DIAGNOSIS — L03317 Cellulitis of buttock: Secondary | ICD-10-CM | POA: Diagnosis not present

## 2019-11-30 DIAGNOSIS — G8220 Paraplegia, unspecified: Secondary | ICD-10-CM | POA: Insufficient documentation

## 2019-11-30 DIAGNOSIS — Z880 Allergy status to penicillin: Secondary | ICD-10-CM | POA: Insufficient documentation

## 2019-12-06 ENCOUNTER — Encounter: Payer: Self-pay | Admitting: Infectious Diseases

## 2019-12-06 ENCOUNTER — Other Ambulatory Visit: Payer: Self-pay

## 2019-12-06 ENCOUNTER — Ambulatory Visit: Payer: Medicare Other | Attending: Infectious Diseases | Admitting: Infectious Diseases

## 2019-12-06 VITALS — BP 150/68 | HR 57 | Temp 97.2°F | Resp 16 | Ht 73.0 in | Wt 210.0 lb

## 2019-12-06 DIAGNOSIS — M8660 Other chronic osteomyelitis, unspecified site: Secondary | ICD-10-CM

## 2019-12-06 DIAGNOSIS — L89314 Pressure ulcer of right buttock, stage 4: Secondary | ICD-10-CM | POA: Diagnosis not present

## 2019-12-06 NOTE — Progress Notes (Signed)
NAME: [name]  DOB: 04-29-1951  MRN: [id number]  Date/Time: 12/06/2019 9:13 AM Subjective:  [name] is a 69 y.o. 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 )  Followed by [name].   Follow up visit= last seen on 10/27/19 and then on 11/10/19 he had a PICC line and was started on Vancomycin in preparation for plastic surgery   Pt was referred by his plastic surgeon to start IV antibiotics for chronic osteomyelitis of the rt ischium and as he is going for a flap to close the rt ischial wound stage IV ,the surgeon wanted him to be on IV antibiotic   Known to me from previous hospitalization and follow up for rt ischial ulcer secondary to pressure and osteomyelitis in 2019.  History as follows  Was in the hospital between 12/20- 07/20/2018 for right gluteal fold decubitus ulcer.  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 As it was getting worse he was sent to the hospital.He underwent debridement of the wound and bone biopsy by Dr.Cannon on 07/15/18. Culture was MRSA and bone biopsy was chronic osteo. He was sent to NH on vanco and ceftriaxone to complete 6 weeks.which he did on 08/23/18 and was on PO doxy + flagyl for couple more weeks.  After that he was followed at the wound clinic- He has been on hyperbaric oxygen ( 60 Rx) but the wound has not closed- HE went to [facility] plastic surgery and saw  Dr. Michela Pitcher on 09/30/19. He reviewed all his CT scans from 2019/2020 and referred him to me for treating the chronic osteo with IV antibiotics .  His last CT pelvis with contrast was June 2020 and it  showed Decubitus ulcer over the right ischial tuberosity with ulcer base nearly reaching bone. Chronic reparative changes seen at the ischial tuberosity, progressed from December 2019. No evidence of collection./15/20 and it showed mixed fecal flora He follows at the wound He has a bone scan on 04/06/19 and it showedosteomyelitis involving the right ischial tuberosity.  Had a deep wound culture on 10/31/19 taken by the wound clinic and it was MRSA and he was started on vanco IV on 11/10/19 Pt is taking his antibiotic regularly No side effects Says the wound is draining more No fever Says he had an appt with his plastic surgeon on 12/22/19 and it has been postponed to 6/18  Past Medical History:  Diagnosis Date  . AVM (arteriovenous malformation) spine   . Cancer [facility])    prostate, taking Casodex (hormone chemotherapy)  . History of kidney stones    several times  . Hypertension   . 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 Prostate cancer ([place])   . Status post insertion of intrathecal baclofen pump   . Status post insertion of spinal cord stimulator     Past Surgical History:  Procedure Laterality Date  . COLONOSCOPY    . CYSTOSCOPY     several times  . CYSTOSCOPY WITH DIRECT VISION INTERNAL URETHROTOMY  05/05/2017   Procedure: CYSTOSCOPY WITH DIRECT VISION INTERNAL URETHROTOMY;  Surgeon: Yves Dill,  [name], MD;  Location: [facility] ORS;  Service: Urology;;  . Consuela Mimes WITH LITHOLAPAXY N/A 05/05/2017   Procedure: CYSTOSCOPY WITH LITHOLAPAXY;  Surgeon: Royston Cowper, MD;  Location: [facility] ORS;  Service: Urology;  Laterality: N/A;  . EXTRACORPOREAL SHOCK WAVE LITHOTRIPSY    . EYE SURGERY Bilateral    cataract surgery  . HOLMIUM LASER APPLICATION N/A 17/[id number]/8144   Procedure: HOLMIUM LASER APPLICATION;  Surgeon: Royston Cowper, MD;  Location: [facility] ORS;  Service: Urology;  Laterality: N/A;  . [place]  . LUMBAR [place] SURGERY  2005   resulted in paralysis of  lower extrems  . SKIN DEBRIDEMENT Right 07/15/2018   Procedure: DEBRIDEMENT SKIN FULL THICKNESS--stage 4 decubitus ulcer;  Surgeon: Fredirick Maudlin, MD;  Location: [facility] ORS;  Service: General;  Laterality: Right;    Social History   Socioeconomic History  . Marital status: Married    Spouse name: Not on file  . Number of children: Not on file  . Years of education: Not on file  . Highest education level: Not on file  Occupational History  . Not on file  Tobacco Use  . Smoking status: Never Smoker  . Smokeless tobacco: Former Systems developer    Types: Snuff  Substance and Sexual Activity  . Alcohol use: No  . Drug use: No  . Sexual activity: Not on file  Other Topics Concern  . Not on file  Social History Narrative  . Not on file   Social Determinants of Health   Financial Resource Strain:   . Difficulty of Paying Living Expenses:   Food Insecurity:   . Worried About Charity fundraiser in the Last Year:   . Arboriculturist in the Last Year:   Transportation Needs:   . Film/video editor (Medical):   Marland Kitchen Lack of Transportation (Non-Medical):   Physical Activity:   . Days of Exercise per Week:   . Minutes of Exercise per Session:   Stress:   . Feeling of Stress :   Social Connections:   . Frequency of Communication with Friends and Family:   . Frequency of Social Gatherings with Friends and Family:   . Attends Religious Services:   . Active Member of Clubs or Organizations:   . Attends Archivist Meetings:   Marland Kitchen Marital Status:   Intimate Partner Violence:   . Fear of Current or Ex-Partner:   . Emotionally Abused:   Marland Kitchen Physically Abused:   . Sexually Abused:     No family history on file. Allergies  Allergen Reactions  . Clindamycin Hives  . Doxycycline Rash  . Penicillins Rash  . Sulfamethoxazole Rash   ? Current Outpatient Medications  Medication Sig Dispense Refill  . acetaminophen (TYLENOL) 500 MG tablet Take 1,000 mg by mouth 2 (two) times daily.     . baclofen (LIORESAL) 10 MG tablet Take 10 mg by mouth daily as needed for muscle spasms.     . bicalutamide (CASODEX) 50 MG tablet Take 50 mg by mouth daily.    Marland Kitchen gabapentin (NEURONTIN) 800 MG tablet Take 1 tablet (800 mg total) by mouth 3 (three) times daily. 90 tablet 0  . losartan (COZAAR) 100 MG tablet Take 100 mg by mouth daily.    . methenamine (HIPREX) 1 g tablet Take 1 g by mouth 3 (three) times daily.     . mirabegron ER (MYRBETRIQ) 50 MG TB24 tablet Take 50 mg by mouth daily.     Marland Kitchen  oxybutynin (DITROPAN-XL) 10 MG 24 hr tablet Take 10 mg by mouth every morning.     Marland Kitchen PAIN MANAGEMENT INTRATHECAL, IT, PUMP 1 each by Intrathecal route continuous. Intrathecal (IT) medication:  Baclofen, Dilaudid    . polyethylene glycol powder (GLYCOLAX/MIRALAX) powder Take 17 g by mouth daily.     Marland Kitchen Propylene Glycol (SYSTANE BALANCE) 0.6 % SOLN Place 1 drop into both eyes daily as needed (dry eyes).     No current facility-administered medications for this visit.     Abtx:  Anti-infectives (From admission, onward)   None      REVIEW OF SYSTEMS:  Const: negative fever, negative chills, intentional weight loss of 50 pounds Eyes: negative diplopia or visual changes, negative eye pain ENT: negative coryza, negative sore throat Resp: negative cough, hemoptysis, dyspnea Cards: negative for chest pain, palpitations, lower extremity edema GU: intermittent self catheterization GI: pump anterior abdominal wall Stimulator posterior  Heme: negative for easy bruising and gum/nose bleeding MSK- no arthralgia Neurolo:paraparesis Psych: negative for feelings of anxiety, depression  Endocrine: no polyuria or polydipsia Allergy/Immunology-few antibiotic allergies as above Objective:  VITALS:  BP (!) 150/68   Pulse (!) 57   Temp (!) 97.2 F (36.2 C)   Resp 16   Ht '6\' 1"'  (1.854 m)   Wt 210 lb (95.3 kg)   SpO2 98%   BMI 27.71 kg/m    PHYSICAL EXAM: in wheel chair- examination limited  General:  Alert,  Head: Normocephalic, without obvious abnormality, atraumatic. Eyes: Conjunctivae clear, anicteric sclerae. Pupils are equal ENT Nares normal. No drainage or sinus tenderness. Lips, mucosa, and tongue normal. No Thrush Neck: Supple,  Back: did not examine. Lungs: Clear to auscultation bilaterally. No Wheezing or Rhonchi. No rales. Heart: Regular rate and rhythm, no murmur, rub or gallop. Abdomen: Soft,  Extremities: rt arm PICC site clean- has a biopatch Skin: could not examine his back wound' as he cannot get up Lymph: Cervical, supraclavicular normal. Neurologic: weakness both legs   ? Impression/Recommendation ? ?Chronic Rt gluteal fold decubitus ulcer with chronic osteomyelitis of the ischial bone. Previously had MRSA in the bone culture in Dec 2019 and received 8 weeks of IV followed by 2 weeks of oral- No antibiotics since March 2020 except for 3 days of PO levaquin on 10/27/19- Culture from 10/31/19 was MRSA . Has been on vanco since 11/10/19 I had spoken to his plastic surgeon Yemi on 4/19 and the plan was to give 4 weeks of IV antibitic and he will take him for surgery and do a deep culture and close the wound with flap and then continue IV accordingly ESR now is 25   Neurogenic bladder- in/out catheter and hippurex  Spinal cord stimulator  Intrathecal baclofen/dilaudid  Left a message for his plastic surgeon to call me to find out his plan for surgery as he will be finishing 4 weeks of IV vanco on 5/20 and 6 weeks on 12/22/19-  Discussed the management with patient and wife-

## 2019-12-06 NOTE — Patient Instructions (Signed)
You are here for follow up of the wound for which you are on vancomycin and you will be needing plastic surgery- will speak to your Plastic surgeon to figure out when he will do the surgery and then decide on how long to continue the antibiotics

## 2019-12-13 ENCOUNTER — Encounter: Payer: Medicare Other | Admitting: Physician Assistant

## 2019-12-13 ENCOUNTER — Other Ambulatory Visit: Payer: Self-pay

## 2019-12-13 DIAGNOSIS — L89314 Pressure ulcer of right buttock, stage 4: Secondary | ICD-10-CM | POA: Diagnosis not present

## 2019-12-13 NOTE — Progress Notes (Signed)
LONEY, SMELLEY ([id number]) Visit Report for 12/13/2019 Chief Complaint Document Details Patient Name: [name], [name] Date of Service: 12/13/2019 8:00 AM Medical Record Number: [id number] Patient Account Number: [id number] Date of Birth/Sex: 03/21/1951 (69 y.o. M) Treating RN: Montey Hora Primary Care Provider: [name] Other Clinician: Referring Provider: [name] Treating Provider/Extender: [name], Xylia Scherger Weeks in Treatment: 106 Information Obtained from: Patient Chief Complaint Right gluteal fold ulcer Electronic Signature(s) Signed: 12/13/2019 12:26:36 PM By: Worthy Keeler [name] Entered By: Worthy Keeler on 12/13/2019 08:16:20 [name], [name] ([id number]) -------------------------------------------------------------------------------- HPI Details Patient Name: [name] Date of Service: 12/13/2019 8:00 AM Medical Record Number: [id number] Patient Account Number: [id number] Date of Birth/Sex: September 21, 1950 (69 y.o. 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 He states that there is no history of diabetes that he is aware of. He does have issues with his prostate and is currently receiving treatment for this by way of oral medication. With that being said I do not have a lot of details in that regard. Nonetheless the patient presents today as a result of having been referred to [place] by another provider initially home health was set to come out and take care of his wound although due to the fact that  he apparently drives he's not able to receive home health. His wife is therefore trying to help take care of this wound within although they have been struggling with what exactly to do at this point. She states that she can do some things but she is definitely not a nurse and does have some issues with looking at blood. The good news is the wound does not appear to be too deep and is fairly superficial at this point. There is no slough noted there is some nonviable skin noted around the surface of the wound and the perimeter at this point. The central portion of the wound appears to be very good with a dermal layer noted this does not appear to be again deep enough to extend it to subcutaneous tissue at this point. 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 In the pump he has Dilaudid and baclofen. 12/07/17 on evaluation today patient presents for follow-up concerning his ongoing lower back thigh ulcer on the right. He states that he did not get the supplies ordered and therefore has not really been able to perform the dressing changes as directed exactly. His wife was able to get some Boarder Foam Dressing's from the drugstore and subsequently has been using hydrogel which did help to a degree in the wound does appear to be able smaller. There is actually more drainage this week noted than previous. 12/21/17 on evaluation today patient appears to be doing rather well in regard to his right gluteal ulcer. He has been tolerating the dressing changes without complication. There does not appear to be any evidence of infection at this point in time. Overall the wound does seem to be making some progress as far as the edges  are concerned there's not as much in the way of overlapping of the external wound edges and he has a good epithelium to wound bed border for the most part. This however is not true  right at the 12 o'clock location over the span of a little over a centimeters which actually will require debridement today to clean this away and hopefully allow it to continue to heal more appropriately. 12/28/17 on evaluation today patient appears to be doing rather well in regard to his ulcer in the left gluteal region. He's been tolerating the dressing changes without complication. Apparently he has had some difficulty getting his dressing material. Apparently there's been some confusion with ordering we're gonna check into this. Nonetheless overall he's been showing signs of improvement which is good news. Debridement is not required today. 01/04/18 on evaluation today patient presents for follow-up concerning his right gluteal ulcer. He has been tolerating the dressing changes fairly well. On inspection today it appears he may actually have some maceration them concerned about the fact that he may be developing too much moisture in and around the wound bed which can cause delay in healing. With that being said he unfortunately really has not showed significant signs of improvement since last week's evaluation in fact this may even be just the little bit/slightly larger. Nonetheless he's been having a lot of discomfort I'm not sure this is even related to the wound as he has no pain when I'm to breeding or otherwise cleaning the wound during evaluation today. Nonetheless this is something that we did recommend he talked to his pain specialist concerning. 01/11/18 on evaluation today patient appears to be doing better in regard to his ulceration. He has been tolerating the dressing changes without complication. With that being said overall there's no evidence of infection which is good news. The only thing is he did receive the hatch affair blue classic versus the ready nonetheless I feel like this is perfectly fine and appears to have done well for him over the past week. 01/25/18 on evaluation today  patient's wound actually appears to be a little bit larger than during the last evaluation. The good news is the majority of the wound edges actually appear to be fairly firmly attached to the wound bed unfortunately again we're not really making progress in regard to the size. Roughly the wound is about the same size as when I first [name] him although again the wound margin/edges appear to be much better. 02/01/18 on evaluation today patient actually appears to be doing very well in regard to his wound. Applying the Prisma dry does seem to be better although he does still have issues with slow progression of the wound. There was a slight improvement compared to last week's measurements today. Nonetheless I have been considering other options as far as the possibility of Theraskin or even a snap vac. In general I'm not sure that the Theraskin due to location of the wound would be a very good idea. Nonetheless I do think that a snap vac could be a possibility for the patient and in fact I think this could even be an excellent way to manage the wound possibly seeing some improvement in a very rapid fashion here. Nonetheless this is something that we would need to get approved and I did have a lengthy conversation with the patient about this today. 02/08/18 on evaluation today patient appears to be doing a little better in regard to his ulcer. He has been tolerating  the dressing changes without complication. Fortunately despite the fact that the wound is a little bit smaller it's not significantly so unfortunately. We have discussed the possibility of a snap vac we did check with insurance this is actually covered at this point. Fortunately there does not appear to be any sign of infection. Overall I'm fairly pleased with how things seem to be appearing at this point. 02/15/18 on evaluation today patient appears to be doing rather well in regard to his right gluteal ulcer. Unfortunately the snap vac did not  stay in place with his sheer and friction this came loose and did not seem to maintain seal very well. He worked for about two days and it did seem to do very well during that time according to his wife but in general this does not seem to be something that's gonna be beneficial for him long-term. I do believe we need to go back to standard dressings to see if we can find something that will be of benefit. 03/02/18- He is here in follow up evaluation; there is minimal change in the wound. He will continue with the same treatment plan, would consider changing to iodosrob/iodoflex if ulcer continues to to plateau. He will follow up next week [name], [name] ([id number]) 03/08/18 on evaluation today patient's wound actually appears to be about the same size as when I previously [name] him several weeks back. Unfortunately he does have some slightly dark discoloration in the central portion of the wound which has me concerned about pressure injury. I do believe he may be sitting for too long a period of time in fact he tells me that "I probably sit for much too long". He does have some Slough noted on the surface of the wound and again as far as the size of the wound is concerned I'm really not seeing anything that seems to have improved significantly. 03/15/18 on evaluation today patient appears to be doing fairly well in regard to his ulcer. The wound measured pretty much about the same today compared to last week's evaluation when looking at his graph. With that being said the area of bruising/deep tissue injury that was noted last week I do not see at this point. He did get a new cushion fortunately this does seem to be have been of benefit in my pinion. It does appear that he's been off of this more which is good news as well I think that is definitely showing in the overall wound measurements. With that being said I do believe that he needs to continue to offload I don't think that the fact this is  doing better should be or is going to allow him to not have to offload and explain this to him as well. Overall he seems to be in agreement the plan I think he understands. The overall appearance of the wound bed is improved compared to last week I think the Iodoflex has been beneficial in that regard. 03/29/18 on evaluation today patient actually appears to be doing rather well in regard to his wound from the overall appearance standpoint he does have some granulation although there's some Slough on the surface of the wound noted as well. With that being said he unfortunately has not improved in regard to the overall measurement of the wound in volume or in size. I did have a discussion with him very specifically about offloading today. He actually does work although he mainly is just sitting throughout the day. He tells me  he offloads by "lifting himself up for 30 seconds off of his chair occasionally" purchase from [employer] which does seem to have helped. And he has a new cushion that he with that being said he's also able to stand some for a very short period of time but not significant enough I think to provide appropriate offloading. I think the biggest issue at this point with the wound and the fact is not healing as quickly as we would like is due to the fact that he is really not able to appropriately offload while at work. He states the beginning after his injury he actually had a bed at his job that he could lay on in order to offload and that does seem to have been of help back at that time. Nonetheless he had not done this in quite some time unfortunately. I think that could be helpful for him this is something I would like for him to look into. 04/05/18 on evaluation today patient actually presents for follow-up concerning his right gluteal ulcer. Again he really is not significantly improved even compared to last week. He has been tolerating the dressing changes without  complication. With that being said fortunately there appears to be no evidence of infection at this time. He has been more proactive in trying to offload. 04/12/18 on evaluation today patient actually appears to be doing a little better in regard to his wound and the right gluteal fold region. He's been tolerating the dressing changes since removing the oasis without complication. However he was having a lot of burning initially with the oasis in place. He's unsure of exactly why this was given so much discomfort but he assumes that it was the oasis itself causing the problem. Nonetheless this had to be removed after about three days in place although even those three days seem to have made a fairly good improvement in regard to the overall appearance of the wound bed. In fact is the first time that he's made any improvement from the standpoint of measurements in about six weeks. He continues to have no discomfort over the area of the wound itself which leads me to wonder why he was having the burning with the oasis when he does not even feel the actual debridement's themselves. I am somewhat perplexed by this. 04/19/18 on evaluation today patient's wound actually appears to be showing signs of epithelialization around the edge of the wound and in general actually appears to be doing better which is good news. He did have the same burning after about three days with applying the Endoform last week in the same fashion that I would generally apply a skin substitute. This seems to indicate that it's not the oasis to cause the problem but potentially the moisture buildup that just causes things to burn or there may be some other reaction with the skin prep or Steri-Strips. Nonetheless I'm not sure that is gonna be able to tolerate any skin substitute for a long period of time. The good news is the wound actually appears to be doing better today compared to last week and does seem to finally be making some  progress. 04/26/18 on evaluation today patient actually appears to be doing rather well in regard to his ulcer in the right gluteal fold. He has been tolerating the dressing changes without complication which is good news. The Endoform does seem to be helping although he was a little bit more macerated this week. This seems to be an  ongoing issue with fluid control at this point. Nonetheless I think we may be able to add something like Drawtex to help control the drainage. 05/03/18 on evaluation today patient appears to actually be doing better in regard to the overall appearance of his wound. He has been tolerating the dressing changes without complication. Fortunately there appears to be no evidence of infection at this time. I really feel like his wound has shown signs as of today of turning around last week I thought so as well and definitely he could be seen in this week's overall appearance and measurements. In general I'm very pleased with the fact that he finally seems to be making a steady but sure progress. The patient likewise is very pleased. 05/17/18 on evaluation today patient appears to be doing more poorly unfortunately in regard to his ulcer. He has been tolerating the dressing changes without complication. With that being said he tells me that in the past couple of days he and his wife have noticed that we did not seem to be doing quite as well is getting dark near the center. Subsequently upon evaluation today the wound actually does appear to be doing worse compared to previous. He has been tolerating the dressing changes otherwise and he states that he is not been sitting up anymore than he was in the past from what he tells me. Still he has continued to work he states "I'm tired of dealing with this and if I have to just go home and lay in the bed all the time that's what I'll do". Nonetheless I am concerned about the fact that this wound does appear to be deeper than what it  was previous. 05/24/18 upon evaluation today patient actually presents after having been in the hospital due to what was presumed to be sepsis secondary to the wound infection. He had an elevated white blood cell count between 14 and 15. With that being said he does seem to be doing somewhat better now. His wound still is giving him some trouble nonetheless and he is obviously concerned about the fact likely talked about that this does seem to go more deeply than previously noted. I did review his wound culture which showed evidence of Staphylococcus aureus him and group B strep. Nonetheless he is on antibiotics, Levaquin, for this. Subsequently I did review his intake summary from the hospital as well. I also did look at the CT of the lumbar spine with contrast that was performed which showed no bone destruction to suggest lumbar disguises/osteomyelitis or sacral osteomyelitis. There was no paraspinal abscess. Nonetheless it appears this may have been more of just a soft tissue infection at this point which is good news. He still is nonetheless concerned about the wound which again I think is completely reasonable considering everything he's been through recently. 05/31/18 on evaluation today on evaluation today patient actually appears to be showing signs of his wound be a little bit deeper than what I would like to see. Fortunately he does not show any signs of significant infection although his temperature was 99 today he states he's been checking this at home and has not been elevated. Nonetheless with the undermining that I'm seeing at this point I am becoming more concerned about the wound I do think that offloading is a key factor here that is preventing the speedy recovery at this point. There does not appear to be any evidence of again over infection noted. He's been using Santyl currently. 06/07/18 the  patient presents today for follow-up evaluation regarding the left ulcer in the gluteal  region. He has been tolerating the Wound VAC Gatt, Kaenan J. ([id number]) fairly well. He is obviously very frustrated with this he states that to mean is really getting in his way. There does not appear to be any evidence of infection at this time he does have a little bit of odor I do not necessarily associate this with infection just something that we sometimes notice with Wound VAC therapy. With that being said I can definitely catch a tone of discontentment overall in the patient's demeanor today. This when he was previously in the hospital an CT scan was done of the lumbar region which did not reveal any signs of osteomyelitis. With that being said the pelvis in particular was not evaluated distinctly which means he could still have some osteonecrosis I. Nonetheless the Wound VAC was started on Thursday I do want to get this little bit more time before jumping to a CT scan of the pelvis although that is something that I might would recommend if were not see an improvement by that time. 06/14/18 on evaluation today patient actually appears to be doing about the same in regard to his right gluteal ulcer. Again he did have a CT scan of the lumbar spine unfortunately this did not include the pelvis. Nonetheless with the depth of the wound that I'm seeing today even despite the fact that I'm not seeing any evidence of overt cellulitis I believe there's a good chance that we may be dealing with osteomyelitis somewhere in the right Ischial region. No fevers, chills, nausea, or vomiting noted at this time. 06/21/18 on evaluation today patient actually appears to be doing about the same with regard to his wound. The tunnel at 6 o'clock really does not appear to be any deeper although it is a little bit wider. I think at this point you may want to start packing this with white phone. Unfortunately I have not got approval for the CT scan of the pelvis as of yet due to the fact that Medicare apparently  has been denied it due to the diagnosis codes not being appropriate according to Medicare for the test requested. With that being said the patient cannot have an MRI and therefore this is the only option that we have as far as testing is concerned. The patient has had infection and was on antibiotics and been added code for cellulitis of the bottom to see if this will be appropriate for getting the test approved. Nonetheless I'm concerned about the infection have been spread deeper into the Ischial region. 06/28/18 on evaluation today patient actually appears to be doing rather well all things considered in regard to the right gluteal ulcer. He has been tolerating the dressing changes without complication. With that being said the Wound VAC he states does have to be replaced almost every day or at least reinforced unfortunately. Patient actually has his CT scan later this morning we should have the results by tomorrow. 07/05/18 on evaluation today patient presents for follow-up concerning his right Ischial ulcer. He did see the surgeon Dr. Lysle Pearl last week. They were actually very happy with him and felt like he spent a tremendous amount of time with them as far as discussing his situation was concerned. In the end Dr. Lysle Pearl did contact me as well and determine that he would not recommend any surgical intervention at this point as he felt like it would not be  in the patient's best interest based on what he was seeing. He recommended a referral to infectious disease. Subsequently this is something that Dr. Ines Bloomer office is working on setting up for the patient. As far as evaluation today is concerned the patient's wound actually appears to be worse at this point. I am concerned about how things are progressing and specifically about infection. I do not feel like it's the deeper but the area of depth is definitely widening which does have me concerned. No fevers, chills, nausea, or vomiting noted at this  time. I think that we do need initiate antibiotic therapy the patient has an allow allergy to amoxicillin/penicillin he states that he gets a rash since childhood. Nonetheless she's never had the issues with Catholics or cephalosporins in general but he is aware of. 07/27/18 on evaluation today patient presents following admission to the hospital on 07/09/18. He was subsequently discharged on 07/20/18. On 07/15/18 the patient underwent irrigation and debridement was soft tissue biopsy and bone biopsy as well as placement of a Wound VAC in the OR by Dr. Celine Ahr. During the hospital course the patient was placed on a Wound VAC and recommended follow up with surgery in three weeks actually with Dr. Delaine Lame who is infectious disease. The patient was on vancomycin during the hospital course. 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 He also had a bone culture which revealed evidence of methicillin-resistant staph aureus. He is updated CT scan 07/09/18 reveals that he had progression of the which was performed on wound to breakdown down to the trochanter where he actually had irregularities there as well suggestive of osteomyelitis. This was a change just since 9 December when we last performed a CT scan. Obviously this one had gone downhill quite significantly and rapidly. At this point upon evaluation I feel like in general the patient's wound seems to be doing fairly well all things considered upon my evaluation today. Obviously this is larger and deeper than what I previously evaluated but at the same time he seems to be making some progress as far as the appearance of the granulation tissue is concerned. I'm happy in that regard. No fevers, chills, nausea, or vomiting noted at this time. He is on IV vancomycin and Rocephin at the facility. He is currently in [employer]. 08/03/18 upon evaluation today patient's wound appears to be doing better in regard to the  overall appearance at this point in time. Fortunately he's been tolerating the Wound VAC without complication and states that the facility has been taking excellent care of the wound site. Overall I see some Slough noted on the surface which I am going to attempt sharp debridement today of but nonetheless other than this I feel like he's making progress. 08/09/18 on evaluation today patient's wound appears to be doing much better compared to even last week's evaluation. Do believe that the Wound VAC is been of great benefit for him. He has been tolerating the dressing changes that is the Wound VAC without any complication and he has excellent granulation noted currently. There is no need for sharp debridement at this point. 08/16/18 on evaluation today patient actually appears to be doing very well in regard to the wound in the right gluteal fold region. This is showing signs of progress and again appears to be very healthy which is excellent news. Fortunately there is no sign of active infection by way of odor or drainage at this point. Overall I'm very  pleased with how things stand. He seems to be tolerating the Wound VAC without complication. 08/23/18 on evaluation today patient actually appears to be doing better in regard to his wound. He has been tolerating the Wound VAC without complication and in fact it has been collecting a significant amount of drainage which I think is good news especially considering how the wound appears. Fortunately there is no signs of infection at this time definitely nothing appears to be worse which is good news. He has not been started on the Bactrim and Flagyl that was recommended by Dr. Delaine Lame yet. I did actually contact her office this morning in order to check and see were things are that regard their gonna be calling me back. 08/30/18 on evaluation today patient actually appears to show signs of excellent improvement today compared to last evaluation. The  undermining is getting much better the wound seems to be feeling quite nicely and I'm very pleased that the granulation in general. With that being said overall I feel like the patient has made excellent progress which is great news. No fevers, chills, nausea, or vomiting noted at this time. 09/06/18 on evaluation today patient actually appears to be doing rather well in regard to his right gluteal ulcer. This is showing signs of improvement in overall I'm very pleased with how things seem to be progressing. The patient likewise is please. Overall I see no evidence of infection he is about to complete his oral antibiotic regimen which is the end of the antibiotics for him in just about three days. 09/13/18 on evaluation today patient's right Ischial ulcer appears to be showing signs of continued improvement which is excellent news. He's been tolerating the dressing changes without complication. Fortunately there's no signs of infection and the wound that seems to be doing very well. 09/28/18 on evaluation today patient appears to be doing rather well in regard to his right Ischial ulcer. He's been tolerating the Wound VAC Peets, Hubbard J. ([id number]) without complication he knows there's much less drainage than there used to be this obviously is not a bad thing in my pinion. There's no evidence of infection despite the fact is but nothing about it now for several weeks. 10/04/18 on evaluation today patient appears to be doing better in regard to his right Ischial wound. He has been tolerating the Wound VAC without complication and I do believe that the silver nitrate last week was beneficial for him. Fortunately overall there's no evidence of active infection at this time which is great news. No fevers, chills, nausea, or vomiting noted at this time. 10/11/18 on evaluation today patient actually appears to be doing rather well in regard to his Ischial ulcer. He's been tolerating the Wound VAC  still without complication I feel like this is doing a good job. No fevers, chills, nausea, or vomiting noted at this time. 11/01/18 on evaluation today patient presents after having not been seen in our clinic for several weeks secondary to the fact that he was on evaluation today patient presents after having not been seen in our clinic for several weeks secondary to the fact that he was in a skilled nursing facility which was on lockdown currently due to the covert 19 national emergency. Subsequently he was discharged from the facility on this past Friday and subsequently made an appointment to come in to see yesterday. Fortunately there's no signs of active infection at this time which is good news and overall he does seem to have made  progress since I last [name]. Overall I feel like things are progressing quite nicely. The patient is having no pain. 11/08/18 on evaluation today patient appears to be doing okay in regard to his right gluteal ulcer. He has been utilizing a Wound VAC home health this changing this at this point since he's home from the skilled nursing facility. Fortunately there's no signs of obvious active infection at this time. Unfortunately though there's no obvious active infection he is having some maceration and his wife states that when the sheets of the Wound VAC office on Sunday when it broke seal that he ended up having significant issues with some smell as well there concerned about the possibility of infection. Fortunately there's No fevers, chills, nausea, or vomiting noted at this time. 11/15/18 on evaluation today patient actually appears to be doing well in regard to his right gluteal ulcer. He has been tolerating the dressing changes without complication. Specifically the Wound VAC has been utilized up to this point. Fortunately there's no signs of infection and overall I feel like he has made progress even since last week when I last [name] him. I'm actually fairly happy  with the overall appearance although he does seem to have somewhat of a hyper granular overgrowth in the central portion of the wound which I think may require some sharp debridement to try flatness out possibly utilizing chemical cauterization following. 11/23/18 on evaluation today patient actually appears to be doing very well in regard to his sacral ulcer. He seems to be showing signs of improvement with good granulation. With that being said he still has the small area of hyper granulation right in the central portion of the wound which I'm gonna likely utilize silver nitrate on today. Subsequently he also keeps having a leak at the 6 o'clock location which is unfortunate we may be able to help out with some suggestions to try to prevent this going forward. Fortunately there's no signs of active infection at this time. 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 He's been tolerating the dressing changes without complication. Fortunately there's no signs of active infection at this time. I've been rather pleased with how things have progressed there still some evidence of pressure getting to the area with some redness right around the immediate wound opening. Nonetheless other than this I'm not seeing any significant complications or issues the wound is somewhat hyper granular. Upon discussing with the patient and his wife today I'm not sure that the wound is being packed to the base with the foam at this point. And if it's not been packed fully that may be part of the reason why is not seen as much improvement as far as the granulation from the base out. Again we do not want pack too tightly but we need some of the firm to get to the base of the wound. I discussed this with patient and his wife today. 12/06/18 on evaluation today patient appears to be doing well in regard to his right gluteal pressure  ulcer. He's been tolerating the dressing changes without complication. Fortunately there's no signs of active infection. He still has some hyper granular tissue and I do think it would be appropriate to continue with the chemical cauterization as of today. 12/16/18 on evaluation today patient actually appears to be doing okay in regard to his right gluteal ulcer. He is been tolerating the dressing  changes without complication including the Wound VAC. Overall I feel like nothing seems to be worsening I do feel like that the hyper granulation buds in the central portion of the wound have improved to some degree with the silver nitrate. We will have to see how things continue to progress. 12/20/18 on evaluation today patient actually appears to be doing much worse in my pinion even compared to last week's evaluation. Unfortunately as opposed to showing any signs of improvement the areas of hyper granular tissue in the central portion of the wound seem to be getting worse. Subsequently the wound bed itself also seems to be getting deeper even compared to last week which is both unusual as well as concerning since prior he had been shown signs of improvement. Nonetheless I think that the issue could be that he's actually having some difficulty in issues with a deeper infection. There's no external signs of infection but nonetheless I am more worried about the internal, osteomyelitis, that could be restarting. He has not been on antibiotics for some time at this point. I think that it may be a good idea to go ahead and started back on an antibiotic therapy while we wait to see what the testing shows. 12/27/18 on evaluation today patient presents for follow-up concerning his left gluteal fold wound. Fortunately he appears to be doing well today. I did review the CT scan which was negative for any signs of osteomyelitis or acute abnormality this is excellent news. Overall I feel like the surface of the wound bed  appears to be doing significantly better today compared to previously noted findings. There does not appear any signs of infection nor does he have any pain at this time. 01/03/19 on evaluation today patient actually appears to be doing quite well in regard to his ulcer. Post debridement last week he really did not have too much bleeding which is good news. Fortunately today this seems to be doing some better but we still has some of the hyper granular tissue noted in the base of the wound which is gonna require sharp debridement today as well. Overall I'm pleased with how things seem to be progressing since we switched away from the Wound VAC I think he is making some progress. 01/10/19 on evaluation today patient appears to be doing better in regard to his right gluteal fold ulcer. He has been tolerating the dressing changes without complication. The debridement to seem to be helping with current away some of the poor hyper granular tissue bugs throughout the region of his gluteal fold wound. He's been tolerating the dressing changes otherwise without complication which is great news. No fevers, chills, nausea, or vomiting noted at this time. 01/17/19 on evaluation today patient actually appears to be doing excellent in regard to his wound. He's been tolerating the dressing changes without complication. Fortunately there is no signs of active infection at this time which is great news. No fevers, chills, nausea, or vomiting noted at this time. 01/24/19 on evaluation today patient actually appears to be doing quite well with regard to his ulcer. He has been tolerating the dressing changes without complication. Fortunately there's no signs of active infection at this time. Overall been very pleased with the progress that he seems to be [name], [name]. ([id number]) making currently. 01/31/19-Patient returns at 1 week with apparent similarity in dimensions to the wound, with no signs of infection, he  has been changing dressings twice a day 02/08/19 upon evaluation today patient actually  appears to be doing well with regard to his right Ischial ulcer. The wound is not appear to be quite as deep and seems to be making progress which is good news. With that being said I'm still reluctant to go back to the Wound VAC at this point. He's been having to change the dressings twice a day which is a little bit much in my pinion from the wound care supplies standpoint. I think that possibly attempting to utilize extras orbit may be beneficial this may also help to prevent any additional breakdown secondary to fluid retention in the wound itself. The patient is in agreement with giving this a try. 02/15/19 on evaluation today patient actually appears to be doing decently well with regard to his ulcer in the right to gluteal fold location. He's been tolerating the dressing changes without complication. Fortunately there is no signs of active infection at this time. He is able to keep the current dressing in place more effectively for a day at a time whereas before he was having a changes to to three times a day. The actions or has been helpful in this regard. Fortunately there's no signs of anything getting worse and I do feel like he showing signs of good improvement with regard to the wound bed status. 02/22/2019 patient appears to be doing very well today with regard to his ulcer in the gluteal fold. Fortunately there is no signs of active infection and he has been tolerating the dressing changes without any complication. Overall extremely pleased with how things seem to be progressing. He has much less of the hyper granular projections within the wound these have slowly been debrided away and he seems to be doing well. The wound bed is more uniform. 03/01/19 on evaluation today patient appears to be doing unfortunately about the same in regard to his gluteal ulcer. He's been tolerating the dressing changes  without complication. Fortunately there's no signs of active infection at this time. With that being said he continues to develop these hyper granular projections which I'm unsure of exactly what they are and why they are rising. Nonetheless I explained to the patient that I do believe it would be a good idea for [place] to stand a biopsy sample for pathology to see if that can shed any light on what exactly may be going on here. Fortunately I do not see any obvious signs of infection. With that being said the patient has had a little bit more drainage this week apparently compared to last week. 03/08/2019 on evaluation today patient actually appears to look somewhat better with regard to the appearance of his wound bed at this time. This is good news. Overall I am very pleased with how things seem to have progressed just in the past week with a switch to the [facility] dressing. I think that has been beneficial for him. With that being said at this time the patient is concerned about his biopsy that I sent off last week unfortunately I do not have that report as of yet. Nonetheless we have called to obtain this and hopefully will hear back from the lab later this morning. 03/15/19 on evaluation today patient's wound actually appears to be doing okay today with regard to the overall appearance of the wound bed. He has been tolerating the dressing changes without complication he still has hyper granular tissue noted but fortunately that seems to be minimal at this point compared to some of what we've seen in the  past. Nonetheless I do think that he is still having some issues currently with some of this type of granulation the biopsy and since all showed nothing more than just evidence of granulation tissue. Therefore there really is nothing different to initiate or do at this point. 03/24/19 on evaluation today patient appears to be doing a little better with regard to his ulcer. He's been tolerating the  dressing changes without complication. Fortunately there is no signs of active infection at this time. No fevers, chills, nausea, or vomiting noted at this time. I'm overall pleased with how things seem to be progressing. 03/29/2019 on evaluation today patient appears to be doing about the same in regard to his ulcer in the right gluteal fold. Unfortunately he is not seeming to make a lot of progress and the wound is somewhat stalled. There is no signs of active infection externally but I am concerned about the possibility of infection continuing in the ischial location which previously he did have infection noted. Again is not able to have an MRI so probably our best option for testing for this would be a triple phase bone scan which will detect subtle changes in the bone more so than plain film x-rays. In the past they really have not been beneficial and in fact the CT scans even have been somewhat questionable at times. Nonetheless there is no signs of systemic infection which is at least good news but again his wound is not healing at all on the predicted schedule. 04/05/19 on evaluation today patient appears to be doing well all things considering with regard to his wound and the right gluteal fold. He actually has his triple bone scan scheduled for sometime in the next couple of days. With that being said I've also been looking to other possibilities of what could be causing hyper granular tissue were looking into the bone scan again for evaluating for the risk or possibility of infection deeper and I'm also gonna go ahead and see about obtaining a deep tissue culture today to send and see if there's any evidence of infection noted on culture. He's in agreement with that plan. 04/12/2019 on evaluation today patient presents for reevaluation here in our clinic concerning ongoing issues with his right gluteal fold ulcer. 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 It was discussed with him at that point that I think it would be appropriate for him to proceed with hyperbaric oxygen therapy especially in light of the fact that he is previously been on IV antibiotics at the beginning of the year for close to 3 months followed by several weeks of oral antibiotics that was all prescribed by infectious disease. He had surgical debridement around Christmas December 2019 due to an abscess and osteomyelitis of the ischial bone. Unfortunately this has not really proceeded as well as we would have liked and again we did a CT scan even a couple months ago as he cannot have an MRI secondary to having issues with both a pain pump as well as a spinal cord stimulator which prevent him from going into an MRI machine. With that being said there were chronic changes noted at the ischial tuberosity which had progressed since December 2019 there was no evidence of fluid collection on that initial CT scan. With that being said that was on January 21, 2019. I am not sure that it  was a sensitive enough test as compared to an MRI and then subsequently I ordered a bone scan for him which was actually completed on 03/29/2019 and this revealed that he does indeed have positive osteomyelitis involving the right ischial tuberosity. This is adjacent to the ulcer and I think is the reason that his ulcer is not healing. Subsequently I am in a place him back on oral antibiotics today unfortunately his wound culture showed just mixed gram-negative flora with no specific findings of a predominant organism. Nonetheless being with the location I think a good broad-spectrum antibiotic for gram-negative's is a good choice at this point he is previously taken Levaquin without significant issues and I think that is an appropriate antibiotic for him at this time. 04/19/2019 on evaluation today patient actually appears to be  doing fairly well with regard to his wound. He has been tolerating the dressing changes without complication. Fortunately there is no signs of active infection and he has been taking the oral antibiotics at this time. Subsequently we did make a referral to infectious disease although Dr. Steva Ready wants the patient to be seen by general surgery first in order apparently to see if there is anything from a surgical standpoint that should be done prior to initiation of IV antibiotic therapy. Again the patient is okay with actually seeing Dr. Celine Ahr whom he has seen before we will make that referral. 04/26/2019 on evaluation today patient actually appears to be doing well with regard to his wound. He has been tolerating the dressing changes MATTHIEU, LOFTUS. ([id number]) without complication. Fortunately there is no signs of active infection at this time. I do believe that the hyperbaric oxygen therapy along with the antibiotics which I prescribed at this point have been doing well for him. With that being said he has not seen Dr. Celine Ahr the surgeon yet in fact they have not been contacted for scheduling an appointment as of yet. Subsequently the patient is not on antibiotics currently by IV Dr. Steva Ready did want him to see Dr. Celine Ahr first which we are working on trying to get scheduled. We again give the information to the patient today for Dr. Celine Ahr and her number as far as contacting their office to see about getting something scheduled. Again were looking for whether or not they recommend any surgical intervention. 05/03/2019 on evaluation today patient appears to be doing about the same with regard to his wound there may be a little bit of filling in in regard to the base of the wound but still he has quite a bit of hyper granular tissue that I am seeing today. I believe that he may may need a more aggressive sharp debridement possibly even by the surgeon or under anesthesia in order to clear  away some of the hyper granular material in order to help allow for appropriate granulation to fill in. We have made a referral to Dr. Celine Ahr unfortunately it sounds as if they did not receive the referral we contacted them today they should be get in touch with the patient. Depending on how things show from that standpoint the patient may need to see Dr. Ardyth Gal who is the infectious disease specialist although he really does not want a PICC line again. No fevers, chills, nausea, vomiting, or diarrhea. He is tolerating hyperbaric oxygen therapy very well. 05/12/2019 on evaluation today patient presents for follow-up visit concerning his right gluteal fold ulcer. He did see Dr. Celine Ahr the surgeon who previously evaluated his wound and  she actually felt like he was doing quite well with regard to the wound based on what she was seen. He does seem to be responding to some degree with the oral antibiotics along with hyperbaric oxygen therapy at this point she did not see any evidence or need for further surgical intervention at this point. She recommended deferring additional or ongoing antibiotic therapy to Dr. Steva Ready at her discretion. Fortunately there is no signs of active systemic infection. No fevers, chills, nausea, vomiting, or diarrhea. 10/29; patient I am seeing really for the first time today in terms of his wound. 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 He is being treated with hyperbaric oxygen for underlying osteomyelitis most recently documented by a three-phase bone scan. He has been on Levaquin for roughly a month and he is out of these and asked me for a refill. Most recent cultures were negative although I do not think these were bone cultures. He has a very irregular surface to this wound which is almost nodular in texture. It undermines superiorly with the same nodular hyper granulated surface. I see that he was referred to general surgery for an  operative debridement although the surgeon did not agree with this I think that would have been the proper course of action in this. He has been using Hydrofera Blue. He is supposed to start a wound VAC tomorrow which is actually a reapplication of wound VAC. I think mostly last time they had trouble keeping the seal in place I hope we have better look at this this time. 05/24/2019 on evaluation today I did review patient's note from Dr. Dellia Nims where he was seen last week when I was out of the office. Subsequently it does appear that Dr. Dellia Nims was in agreement that the patient really needed debridement to clear away some of the nodular tissue in the base of the wound. The good news is he has the wound VAC initiated and some of this tissue is already starting to breakdown under the treatment of the wound VAC again because it is not really viable. Nonetheless I do believe that we can likely perform some debridement today to clear this away and hopefully the continuation of the wound VAC along with hyperbarics and antibiotic therapy will be beneficial in stopping this from reoccurring. If we get better control in the wound bed in a good spot I think we can definitely use the PriMatrix which we actually did get approval for. 05/31/2019 on evaluation today patient appears to be doing actually pretty well at this point with regard to his wound. He has been tolerating the dressing changes which is good news. Fortunately there is no signs of infection. He still has some of the hyper granular nodules noted we have not really cleared all these away and I think we can try to do a little bit more that today. I did do quite a bit last week I am hoping to get down to the base of the wound so though actually be able to heal more appropriately. I think the wound VAC is doing a good job right now with controlling the drainage and overall the patient is happy with that. 06/07/2019 upon evaluation today patient appears  to be doing quite well compared to last week with regard to the hyper granulation in the base of the wound. He has been tolerating the dressing changes without complication. Fortunately there is no signs of active infection at this time. With  that being said we have not had any recent measures as far as blood work is concerned I think we may want to check a few things including a CBC, sed rate, and C-reactive protein. Patient is in agreement with that we can try to get that scheduled for him for this Friday. 06/13/2019 on evaluation today patient actually appears to be doing much better at this point based on what I am seeing with regard to his wound. He has much less in the way of hyper granular tissue which is good news and a lot more healing that is taken place since I last [name] him as far as the amount of space within the wound itself. With that being said he is nearing the completion of the initial 40 treatments for hyperbaric oxygen therapy I think this is something I would recommend extending as he does seem to be benefiting at this time. Fortunately there is no evidence of active systemic infection at this point nor even local infection for that matter. 06/20/2019 upon evaluation today patient actually appears to be doing well with regard to his wound. In fact this appears to be doing much better today compared to what it was previous. I been extremely pleased with the progress that he is made. In fact the tunnel is much less deep today than what it was in the past and I am seeing excellent signs of improvement. Overall I am happy with how things are going. I did review his white blood cell count which revealed everything to be okay. The one abnormal finding on his CBC was a hemoglobin of 12.7. Subsequently I did also review his sed rate and C-reactive protein. His sed rate measured in at normal level measuring at 26. The C-reactive protein however was elevated today and an abnormal range  indicating inflammation. Still I feel like he is trending in a good direction at this time. He is going to need a refill of the Levaquin he tells me at this point. 07/04/2019 on evaluation today patient actually appears to be doing well in regard to his wound. He has been tolerating the dressing changes without complication. Fortunately there is no signs of active infection which is good news. No fevers, chills, nausea, vomiting, or diarrhea. The good news also is that I did review his labs and though his sed rate was slightly elevated the C-reactive protein was actually down compared to the last evaluation 2 weeks ago this is excellent news I think it is showing signs of improvement. 07/11/2019 on evaluation today patient actually appears to be doing quite well with regard to his wound on my evaluation. This is very slow going but nonetheless he seems to be making progress. Especially in regard to the depth. Nonetheless I believe that we may want to consider going forward with the PriMatrix which was previously approved for him. We will get a have to get a new approval as we will actually be applying this in the new year so we will likely obtain that approval on January 4. In the meantime and for the time being my suggestion is going to be that we go ahead and continue with the wound VAC and we were going at this point with the current wound care measures. 12/28-Patient seen after HBO session today, wound depth with the tunnel measures 3.2 cm, the wound bed appears healthy, continuing with the VAC and HBO 07/25/2019 on evaluation today patient's wound appears to be doing about the same compared to my  last evaluation. Fortunately there is no evidence of significant infection which is good news we will still waiting on the results of his lab work which unfortunately was not done prior to today's visit which is what we were aiming for. Ultimately there is no sign of active infection at this time which is  good news. And he also tells me that the wound VAC has been staying in place and doing fairly well which is also good news. With that being said I am concerned about the fact [name], [name]. ([id number]) that the wound has not progressed as much I think we may want to look into getting reapproval for the skin substitute, specifically PriMatrix, that I am hopeful will help to allow this area to granulate in more effectively. I would recommend as well continuing with the wound VAC along with this. 08/01/2019 on evaluation today patient appears to be doing in my opinion slightly better in regard to the wound. He actually does have some tightening of the walls around the edges which is a good thing. Fortunately there is no signs of active infection at this time. No fever chills noted. I do believe that the patient in general seems to be making progress although is very slow. Obviously were trying to get things to speed up somewhat more. No fevers, chills, nausea, vomiting, or diarrhea. 08/08/2019 upon evaluation today patient appears to be doing well with regard to his wound although there is not a significant improvement overall we do have the PriMatrix for availability today to apply. Fortunately there is no signs of active infection and the patient overall seems to be doing well. There is really no need for aggressive sharp debridement at this point either which is also good news. I do believe her get a need to suture and the primary checks in the basin in the PriMatrix in order to keep it in place at the base of the wound underneath the wound VAC. This was discussed with the patient today as well. He is in agreement the plan. He really does not have any filling in the area and so this should not be a complication as far as suturing is concerned. 08/15/2019 on evaluation today patient appears to be doing somewhat better in my opinion in regard to the overall appearance of his wound. He has been  tolerating the dressing changes without complication. Fortunately the skin subappear to still be in place when this was changed on Friday. With that being said it appears that the PriMatrix may have dissolved in the interim between now and then as the sutures are no longer holding anything in place and again I would sutured it into place. Nonetheless I believe that we can go ahead and likely consider going forward that we may need to apply the PriMatrix weekly as opposed every other week as it does seem to have dissolved rather readily and well. Overall the wound bed appears to be doing excellent at this time. 08/22/2019 upon evaluation today the patient does seem to making some progress with regard to his wound. Overall there is some better quality tissue and less of the hyper granular protrusions all of which is good news. With that being said we do have the PriMatrix available for application today and I am in a try to find a better way to suture this in place for him today. 08/29/2019 on evaluation today patient appears to be doing very well in regard to his wound. I do see signs of  new granulation which seems to be somewhat helpful he did have some need for sharp debridement today to clear some of the hyper granulation away but nonetheless I feel like we are headed in a good direction. Fortunately there is no evidence of any systemic infection although we are going to go ahead and reorder the CBC, C-reactive protein, and sed rate today to see where things stand I did just refill his Levaquin as well. 09/05/2019 upon evaluation today patient's wound actually is not appearing to be showing signs of significant improvement compared to what we have seen previous from the standpoint of depth. He overall wound size wise seems to be about the same and again some of the quality of the wound bed is still doing fairly well. I did review his labs and his CBC was normal with no signs of elevation white blood cell  count, his C-reactive protein was normal, and the sed rate was slightly elevated but again I think this may just be due to ulceration really there is nothing significant to indicate severe infection. I think when he likely finishes his current round of oral antibiotics with Levaquin will likely take him off of the medication at that point based on the findings. I do feel like the hyperbarics helped in getting the infection under control as well as improving the overall size of the wound quite significantly. With that being said at this point I really feel like more more at the time frame that he may benefit from a muscle flap in order to try to help this wound heal effectively. I think that this would be the fastest way in order to do this. In the past he has been somewhat resistant to going down that path but nonetheless I feel like he likely should. 09/12/2019 on evaluation today patient appears to be doing fairly well in regard to his wound. He is tolerating the wound VAC without complication is still continues to drain significantly. Fortunately there is no signs of active infection at this time which is great news his lab work is also been doing very well. With that being said I do think at this point that the patient is still deriving benefit from the wound VAC especially in regard to controlling moisture which I think it is doing fairly well. He also has some improvement in the overall wound bed and on the medial aspect of the wound there are some hyper granular projections but nothing as severe as what they have been in the past. I am good have to perform some sharp debridement at this location today. 09/19/2019 upon evaluation today patient appears to be doing roughly the same although again the tissue does seem to be showing some signs of improvement which is good news. There is less hyper granular budding. Nonetheless he still is going require some sharp debridement today. We did get his  albumin and prealbumin results back and they are within normal limits in both cases which is excellent news that was the one thing [facility] plastic surgery was waiting on order to get him scheduled. He does seem to be a good candidate physically for the flap surgery if he decides to go that direction. With that being said is still left to be determined once they see the wound what they feel about how best to proceed. Obviously in the end it would be up to Mr. Bartel as to whether he goes forward with the flap surgery or something else depending on the recommendations.  09/26/2019 upon evaluation today patient appears to be doing about the same in regard to his wound. Fortunately there is no signs of active infection there is also not as much hyper granulation noted today in fact I do not believe there is can be any sharp debridement required at this point. With that being said I do believe he continues to be somewhat stalled at this point with regard to his wound. There is no signs of active infection at this time which is good news. He does have an appointment with [facility] plastic surgery on Friday. 10/03/2019 upon evaluation today patient appears to be doing somewhat poorly especially in his overall demeanor compared to last week's evaluation. He unfortunately did see the physician at [facility] plastic surgery. Unfortunately the plan for the possibility of a flap would be a fairly significant surgery that the patient is really not wanting to take the risk of as if the surgery failed he would end up with a very large open wound at that point. Fortunately there is no signs of active infection at this time. No fevers, chills, nausea, vomiting, or diarrhea. With that being said there was some question that the physician had about why this would not be healing and the possibility that there may be some bone still needs to be removed deeper in the wound. Again the bone scan that we did previous actually did show evidence  of there still being a chronic osteomyelitis back on March 29, 2019. With that being said he did undergo treatment both with hyperbarics as well as oral antibiotics for quite some time since that point. Obviously he may need additional and updated imaging with the surgeon in order to see if there is anything else that can or will need to be done in order to help this area to heal. Obviously I still believe as well that there is something going on here. He is already seeing the surgeon that performed the original surgery after the bone scan but they did not feel that anything needed to be done from a surgical standpoint at that time. 10/10/2019 upon evaluation today patient appears to be really doing about the same with regard to his wound in the gluteal fold region. Unfortunately he is not making a lot of improvement the insurance also contacted home health and apparently the wound VAC is no longer to be covered due to lack of progress. Obviously that is something that we discussed as well as a strong possibility for happening soon and this was even last week that discussed this with him. With that being said again we have tried to manage this wound as best as I could up to this point. Fortunately there is no evidence of active infection at this time. No fevers, chills, nausea, vomiting, or diarrhea. [name], MENDENHALL ([id number]) 10/17/2019 upon evaluation today patient has decided to proceed with the flap surgery. He actually seems to be in better spirits today he states he wants to get this healed and after talking to the surgeon last week he feels like that is his best option for doing so. With that being said he also needs to have a referral to infectious disease to be seen for IV antibiotics preoperatively and postoperatively as far as the skin flap is concerned. He wonders if Dr. Steva Ready would be interested or available to do this for him. I explained that I would definitely have to check  with her and I will be happy to do that but I  am not certain What her answer will be in 2 I have a discussion with her. 10/24/2019 upon evaluation today patient appears to be doing about the same with regard to his wound although he is complaining of increased pain he is really not had any discomfort he states that it is getting much worse at this point. Fortunately there is no signs of active infection. At least not systemically. With that being said I question whether local infection could be a problem here secondary to the fact that to be honest the patient is having pain where he has not previous. 10/31/2019 upon evaluation today patient appears to be doing about the same in regard to his wound in the right gluteal fold region. Subsequently in the interim since I last [name] him last week I did receive a message from Dr. Steva Ready as well as reviewed her note. She was requesting the potential for a bone culture or at least a deep tissue culture in order to see about helping with more directed antibiotic therapy for the patient's projected flap surgery. If we could not obtain more specific organisms for treatment then obviously should be looking at more broad-spectrum antibiotic therapy. With that being said at this point the patient seems ready to move forward with the flap surgery. I did actually sign the paperwork for his bed which came through last week I believe that was on Thursday or Friday. Either way that should be coming to him as well so he will have that in place once the surgery occurs when he is discharged home. Fortunately there is no signs of active infection at this time. No fevers, chills, nausea, vomiting, or diarrhea. 11/15/2019 upon evaluation today patient appears to be doing about the same with regard to his right gluteal fold ulcer. He has been tolerating the dressing changes without complication right now he is awaiting the hospital bed with air mattress in order to be able to be  scheduled for his flap surgery. He is on IV vancomycin at this point is managed by Dr. Steva Ready. 11/30/2019; right gluteal fold extending over the ischial tuberosity. He had underlying osteomyelitis and there is still managing on vancomycin directed by infectious disease. His hospital bed and level 3 surface are arriving. After that there is plans for a myocutaneous flap at [facility]. They state that he will be in hospital for 2 weeks and for at least 2 weeks in a skilled facility may be longer 12/13/2019 upon evaluation today patient's wound appears to be doing about the same. Dr. Steva Ready actually did come for an evaluation of the patient here in office as well as she cannot really get him up on the bed in order to examine him in her office. Subsequently I did review his well his labs his C-reactive protein and sed rate have been doing excellent he actually appears to be maintaining quite nicely in my opinion. Was still having to pack the wound as it is quite deep and obviously he is going to proceed with the flap surgery but nonetheless I think that he is on the right track in that regard. He is currently still on the IV vancomycin although he does end on this on June 3. His surgery is scheduled for June 15. Electronic Signature(s) Signed: 12/13/2019 12:24:27 PM By: Worthy Keeler [name] Entered By: Worthy Keeler on 12/13/2019 12:24:27 [name], [name] ([id number]) -------------------------------------------------------------------------------- Physical Exam Details Patient Name: [name] Date of Service: 12/13/2019 8:00 AM Medical Record Number:  [id number] Patient Account Number: [id number] Date of Birth/Sex: 1950-12-16 (69 y.o. M) Treating RN: Montey Hora Primary Care Provider: [name] Other Clinician: Referring Provider: [name] Treating Provider/Extender: [name], Riki Gehring Weeks in Treatment: 65 Constitutional Well-nourished and well-hydrated in  no acute distress. Respiratory normal breathing without difficulty. Psychiatric this patient is able to make decisions and demonstrates good insight into disease process. Alert and Oriented x 3. pleasant and cooperative. Notes Patient's wound again appears to be doing about the same he does have a fairly deep wound that is still requiring packing at this point unfortunately but again nothing appears to be worsened with any signs of active infection visually and overall very pleased with how things seem to be doing obviously I think he is well prepared for the surgery that is upcoming. Electronic Signature(s) Signed: 12/13/2019 12:24:45 PM By: Worthy Keeler [name] Entered By: Worthy Keeler on 12/13/2019 12:24:44 [name], [name] ([id number]) -------------------------------------------------------------------------------- Physician Orders Details Patient Name: [name] Date of Service: 12/13/2019 8:00 AM Medical Record Number: [id number] Patient Account Number: [id number] Date of Birth/Sex: 05-01-1951 (69 y.o. 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 - in office o Cleanse wound with mild soap and water Anesthetic (add to Medication List) Wound #1 Right Gluteal fold o Topical Lidocaine 4% cream applied to wound bed prior to debridement (In Clinic Only). Skin Barriers/Peri-Wound Care o Skin  Prep Primary Wound Dressing Wound #1 Right Gluteal fold o Silver Alginate Secondary Dressing Wound #1 Right Gluteal fold o Tegaderm o XtraSorb Dressing Change Frequency Wound #1 Right Gluteal fold o Change dressing every other day. Follow-up Appointments Wound #1 Right Gluteal fold o Return Appointment in 1 week. - Thursday Off-Loading Wound #1 Right Gluteal fold o Turn and reposition every 2 hours - no pressure on wounded area Home Health Wound #1 Right Gluteal fold o [place] [facility] [place] Nurse may visit PRN to address patientos wound care needs. o FACE TO FACE ENCOUNTER: MEDICARE and MEDICAID PATIENTS: I certify that this patient is under my care and that I had a face-to-face encounter that meets the physician face-to-face encounter requirements with this patient on this date. The encounter with the patient was in whole or in part for the following MEDICAL CONDITION: (primary reason for Home DARSHAWN, STEWART ([id number]) Healthcare) MEDICAL NECESSITY: I certify, that based on my findings, NURSING services are a medically necessary home health service. HOME BOUND STATUS: I certify that my clinical findings support that this patient is homebound (i.e., Due to illness or injury, pt requires aid of supportive devices such as crutches, cane, wheelchairs, walkers, the use of special transportation or the assistance of another person to leave their place of residence. There is a normal inability to leave the home and doing so requires considerable and taxing effort. Other absences are for medical reasons / religious services and are infrequent or of short duration when for other reasons). o If current dressing causes regression in wound condition, may D/C ordered dressing product/s and apply Normal  Saline Moist Dressing daily until next [place] / Other MD appointment. [place] of regression in wound  condition at [phone]. o Please direct any NON-WOUND related issues/requests for orders to patient's Primary Care Physician Electronic Signature(s) Signed: 12/13/2019 12:26:36 PM By: Worthy Keeler [name] Signed: 12/13/2019 12:56:57 PM By: Montey Hora Entered By: Montey Hora on 12/13/2019 08:50:53 ANGAD, ROESEL ([id number]) -------------------------------------------------------------------------------- Problem List Details Patient Name: [name] Date of Service: 12/13/2019 8:00 AM Medical Record Number: [id number] Patient Account Number: [id number] Date of Birth/Sex: [birth year] (69 y.o. 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 He states that there is no history of diabetes that he is aware of. He does have issues with his prostate and is currently receiving treatment for this by way of oral medication. With that being said I do not have a lot of details in that regard. Nonetheless the patient presents today as a result of having been referred to [place] by another provider initially home health was set to come out and take care of his wound although due to the fact that he apparently drives he's not able to receive home health. His wife is therefore trying to help take care of this wound within although they have been struggling with what exactly to do at this point. She states that she can do some things but she is definitely not a nurse and does have some issues with looking at blood. The good news is the wound does not appear to be too deep and is fairly superficial at this point. There is no slough noted there is some nonviable skin noted around the surface of the wound and the perimeter at this point. The central portion of the wound appears to be very good with a dermal layer noted this does not appear to be again deep enough to extend it to subcutaneous tissue at this point. 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 In the pump he has Dilaudid and baclofen. 12/07/17 on evaluation today patient presents for follow-up concerning his ongoing lower back thigh ulcer  on the right. He states that he did not get the  supplies ordered and therefore has not really been able to perform the dressing changes as directed exactly. His wife was able to get some Boarder Foam Dressing's from the drugstore and subsequently has been using hydrogel which did help to a degree in the wound does appear to be able smaller. There is actually more drainage this week noted than previous. 12/21/17 on evaluation today patient appears to be doing rather well in regard to his right gluteal ulcer. He has been tolerating the dressing changes without complication. There does not appear to be any evidence of infection at this point in time. Overall the wound does seem to be making some progress as far as the edges are concerned there's not as much in the way of overlapping of the external wound edges and he has a good epithelium to wound bed border for the most part. This however is not true right at the 12 o'clock location over the span of a little over a centimeters which actually will require debridement today to clean this away and hopefully allow it to continue to heal more appropriately. 12/28/17 on evaluation today patient appears to be doing rather well in regard to his ulcer in the left gluteal region. He's been tolerating the dressing changes without complication. Apparently he has had some difficulty getting his dressing material. Apparently there's been some confusion with ordering we're gonna check into this. Nonetheless overall he's been showing signs of improvement which is good news. Debridement is not required today. 01/04/18 on evaluation today patient presents for follow-up concerning his right gluteal ulcer. He has been tolerating the dressing changes fairly well. On inspection today it appears he may actually have some maceration them concerned about the fact that he may be developing too much moisture in and around the wound bed which can cause delay in healing. With that being said he unfortunately really has not showed  significant signs of improvement since last week's evaluation in fact this may even be just the little bit/slightly larger. Nonetheless he's been having a lot of discomfort I'm not sure this is even related to the wound as he has no pain when I'm to breeding or otherwise cleaning the wound during evaluation today. Nonetheless this is something that we did recommend he talked to his pain specialist concerning. 01/11/18 on evaluation today patient appears to be doing better in regard to his ulceration. He has been tolerating the dressing changes without complication. With that being said overall there's no evidence of infection which is good news. The only thing is he did receive the hatch affair blue classic versus the ready nonetheless I feel like this is perfectly fine and appears to have done well for him over the past week. 01/25/18 on evaluation today patient's wound actually appears to be a little bit larger than during the last evaluation. The good news is the majority of the wound edges actually appear to be fairly firmly attached to the wound bed unfortunately again we're not really making progress in regard to the size. Roughly the wound is about the same size as when I first [name] him although again the wound margin/edges appear to be much better. 02/01/18 on evaluation today patient actually appears to be doing very well in regard to his wound. Applying the Prisma dry does seem to be better although he does still have issues with slow progression of the wound. There was a slight improvement compared to  last week's measurements today. Nonetheless I have been considering other options as far as the possibility of Theraskin or even a snap vac. In general I'm not sure that the Theraskin due to location of the wound would be a very good idea. Nonetheless I do think that a snap vac could be a possibility for the patient and in fact I think this could even be an excellent way to manage the wound possibly  seeing some improvement in a very rapid fashion here. Nonetheless this is something that we would need to get approved and I did have a lengthy conversation with the patient about this today. 02/08/18 on evaluation today patient appears to be doing a little better in regard to his ulcer. He has been tolerating the dressing changes without complication. Fortunately despite the fact that the wound is a little bit smaller it's not significantly so unfortunately. We have discussed the possibility of a snap vac we did check with insurance this is actually covered at this point. Fortunately there does not appear to be any sign of infection. Overall I'm fairly pleased with how things seem to be appearing at this point. 02/15/18 on evaluation today patient appears to be doing rather well in regard to his right gluteal ulcer. Unfortunately the snap vac did not stay in place with his sheer and friction this came loose and did not seem to maintain seal very well. He worked for about two days and it did seem to do very well during that time according to his wife but in general this does not seem to be something that's gonna be beneficial for him long-term. TOLAND, HANRATTY ([id number]) do believe we need to go back to standard dressings to see if we can find something that will be of benefit. 03/02/18- He is here in follow up evaluation; there is minimal change in the wound. He will continue with the same treatment plan, would consider changing to iodosrob/iodoflex if ulcer continues to to plateau. He will follow up next week 03/08/18 on evaluation today patient's wound actually appears to be about the same size as when I previously [name] him several weeks back. Unfortunately he does have some slightly dark discoloration in the central portion of the wound which has me concerned about pressure injury. I do believe he may be sitting for too long a period of time in fact he tells me that "I probably sit for much  too long". He does have some Slough noted on the surface of the wound and again as far as the size of the wound is concerned I'm really not seeing anything that seems to have improved significantly. 03/15/18 on evaluation today patient appears to be doing fairly well in regard to his ulcer. The wound measured pretty much about the same today compared to last week's evaluation when looking at his graph. With that being said the area of bruising/deep tissue injury that was noted last week I do not see at this point. He did get a new cushion fortunately this does seem to be have been of benefit in my pinion. It does appear that he's been off of this more which is good news as well I think that is definitely showing in the overall wound measurements. With that being said I do believe that he needs to continue to offload I don't think that the fact this is doing better should be or is going to allow him to not have to offload and explain this to him  as well. Overall he seems to be in agreement the plan I think he understands. The overall appearance of the wound bed is improved compared to last week I think the Iodoflex has been beneficial in that regard. 03/29/18 on evaluation today patient actually appears to be doing rather well in regard to his wound from the overall appearance standpoint he does have some granulation although there's some Slough on the surface of the wound noted as well. With that being said he unfortunately has not improved in regard to the overall measurement of the wound in volume or in size. I did have a discussion with him very specifically about offloading today. He actually does work although he mainly is just sitting throughout the day. He tells me he offloads by "lifting himself up for 30 seconds off of his chair occasionally" purchase from [employer] which does seem to have helped. And he has a new cushion that he with that being said he's also able to stand some for a  very short period of time but not significant enough I think to provide appropriate offloading. I think the biggest issue at this point with the wound and the fact is not healing as quickly as we would like is due to the fact that he is really not able to appropriately offload while at work. He states the beginning after his injury he actually had a bed at his job that he could lay on in order to offload and that does seem to have been of help back at that time. Nonetheless he had not done this in quite some time unfortunately. I think that could be helpful for him this is something I would like for him to look into. 04/05/18 on evaluation today patient actually presents for follow-up concerning his right gluteal ulcer. Again he really is not significantly improved even compared to last week. He has been tolerating the dressing changes without complication. With that being said fortunately there appears to be no evidence of infection at this time. He has been more proactive in trying to offload. 04/12/18 on evaluation today patient actually appears to be doing a little better in regard to his wound and the right gluteal fold region. He's been tolerating the dressing changes since removing the oasis without complication. However he was having a lot of burning initially with the oasis in place. He's unsure of exactly why this was given so much discomfort but he assumes that it was the oasis itself causing the problem. Nonetheless this had to be removed after about three days in place although even those three days seem to have made a fairly good improvement in regard to the overall appearance of the wound bed. In fact is the first time that he's made any improvement from the standpoint of measurements in about six weeks. He continues to have no discomfort over the area of the wound itself which leads me to wonder why he was having the burning with the oasis when he does not even feel the actual  debridement's themselves. I am somewhat perplexed by this. 04/19/18 on evaluation today patient's wound actually appears to be showing signs of epithelialization around the edge of the wound and in general actually appears to be doing better which is good news. He did have the same burning after about three days with applying the Endoform last week in the same fashion that I would generally apply a skin substitute. This seems to indicate that it's not the oasis to cause  the problem but potentially the moisture buildup that just causes things to burn or there may be some other reaction with the skin prep or Steri-Strips. Nonetheless I'm not sure that is gonna be able to tolerate any skin substitute for a long period of time. The good news is the wound actually appears to be doing better today compared to last week and does seem to finally be making some progress. 04/26/18 on evaluation today patient actually appears to be doing rather well in regard to his ulcer in the right gluteal fold. He has been tolerating the dressing changes without complication which is good news. The Endoform does seem to be helping although he was a little bit more macerated this week. This seems to be an ongoing issue with fluid control at this point. Nonetheless I think we may be able to add something like Drawtex to help control the drainage. 05/03/18 on evaluation today patient appears to actually be doing better in regard to the overall appearance of his wound. He has been tolerating the dressing changes without complication. Fortunately there appears to be no evidence of infection at this time. I really feel like his wound has shown signs as of today of turning around last week I thought so as well and definitely he could be seen in this week's overall appearance and measurements. In general I'm very pleased with the fact that he finally seems to be making a steady but sure progress. The patient likewise is very  pleased. 05/17/18 on evaluation today patient appears to be doing more poorly unfortunately in regard to his ulcer. He has been tolerating the dressing changes without complication. With that being said he tells me that in the past couple of days he and his wife have noticed that we did not seem to be doing quite as well is getting dark near the center. Subsequently upon evaluation today the wound actually does appear to be doing worse compared to previous. He has been tolerating the dressing changes otherwise and he states that he is not been sitting up anymore than he was in the past from what he tells me. Still he has continued to work he states "I'm tired of dealing with this and if I have to just go home and lay in the bed all the time that's what I'll do". Nonetheless I am concerned about the fact that this wound does appear to be deeper than what it was previous. 05/24/18 upon evaluation today patient actually presents after having been in the hospital due to what was presumed to be sepsis secondary to the wound infection. He had an elevated white blood cell count between 14 and 15. With that being said he does seem to be doing somewhat better now. His wound still is giving him some trouble nonetheless and he is obviously concerned about the fact likely talked about that this does seem to go more deeply than previously noted. I did review his wound culture which showed evidence of Staphylococcus aureus him and group B strep. Nonetheless he is on antibiotics, Levaquin, for this. Subsequently I did review his intake summary from the hospital as well. I also did look at the CT of the lumbar spine with contrast that was performed which showed no bone destruction to suggest lumbar disguises/osteomyelitis or sacral osteomyelitis. There was no paraspinal abscess. Nonetheless it appears this may have been more of just a soft tissue infection at this point which is good news. He still is nonetheless  concerned about  the wound which again I think is completely reasonable considering everything he's been through recently. 05/31/18 on evaluation today on evaluation today patient actually appears to be showing signs of his wound be a little bit deeper than what I would like to see. Fortunately he does not show any signs of significant infection although his temperature was 99 today he states he's been HOBERT, SCHAUM. ([id number]) checking this at home and has not been elevated. Nonetheless with the undermining that I'm seeing at this point I am becoming more concerned about the wound I do think that offloading is a key factor here that is preventing the speedy recovery at this point. There does not appear to be any evidence of again over infection noted. He's been using Santyl currently. 06/07/18 the patient presents today for follow-up evaluation regarding the left ulcer in the gluteal region. He has been tolerating the Wound VAC fairly well. He is obviously very frustrated with this he states that to mean is really getting in his way. There does not appear to be any evidence of infection at this time he does have a little bit of odor I do not necessarily associate this with infection just something that we sometimes notice with Wound VAC therapy. With that being said I can definitely catch a tone of discontentment overall in the patient's demeanor today. This when he was previously in the hospital an CT scan was done of the lumbar region which did not reveal any signs of osteomyelitis. With that being said the pelvis in particular was not evaluated distinctly which means he could still have some osteonecrosis I. Nonetheless the Wound VAC was started on Thursday I do want to get this little bit more time before jumping to a CT scan of the pelvis although that is something that I might would recommend if were not see an improvement by that time. 06/14/18 on evaluation today patient actually  appears to be doing about the same in regard to his right gluteal ulcer. Again he did have a CT scan of the lumbar spine unfortunately this did not include the pelvis. Nonetheless with the depth of the wound that I'm seeing today even despite the fact that I'm not seeing any evidence of overt cellulitis I believe there's a good chance that we may be dealing with osteomyelitis somewhere in the right Ischial region. No fevers, chills, nausea, or vomiting noted at this time. 06/21/18 on evaluation today patient actually appears to be doing about the same with regard to his wound. The tunnel at 6 o'clock really does not appear to be any deeper although it is a little bit wider. I think at this point you may want to start packing this with white phone. Unfortunately I have not got approval for the CT scan of the pelvis as of yet due to the fact that Medicare apparently has been denied it due to the diagnosis codes not being appropriate according to Medicare for the test requested. With that being said the patient cannot have an MRI and therefore this is the only option that we have as far as testing is concerned. The patient has had infection and was on antibiotics and been added code for cellulitis of the bottom to see if this will be appropriate for getting the test approved. Nonetheless I'm concerned about the infection have been spread deeper into the Ischial region. 06/28/18 on evaluation today patient actually appears to be doing rather well all things considered in regard to the right  gluteal ulcer. He has been tolerating the dressing changes without complication. With that being said the Wound VAC he states does have to be replaced almost every day or at least reinforced unfortunately. Patient actually has his CT scan later this morning we should have the results by tomorrow. 07/05/18 on evaluation today patient presents for follow-up concerning his right Ischial ulcer. He did see the surgeon Dr.  Lysle Pearl last week. They were actually very happy with him and felt like he spent a tremendous amount of time with them as far as discussing his situation was concerned. In the end Dr. Lysle Pearl did contact me as well and determine that he would not recommend any surgical intervention at this point as he felt like it would not be in the patient's best interest based on what he was seeing. He recommended a referral to infectious disease. Subsequently this is something that Dr. Ines Bloomer office is working on setting up for the patient. As far as evaluation today is concerned the patient's wound actually appears to be worse at this point. I am concerned about how things are progressing and specifically about infection. I do not feel like it's the deeper but the area of depth is definitely widening which does have me concerned. No fevers, chills, nausea, or vomiting noted at this time. I think that we do need initiate antibiotic therapy the patient has an allow allergy to amoxicillin/penicillin he states that he gets a rash since childhood. Nonetheless she's never had the issues with Catholics or cephalosporins in general but he is aware of. 07/27/18 on evaluation today patient presents following admission to the hospital on 07/09/18. He was subsequently discharged on 07/20/18. On 07/15/18 the patient underwent irrigation and debridement was soft tissue biopsy and bone biopsy as well as placement of a Wound VAC in the OR by Dr. Celine Ahr. During the hospital course the patient was placed on a Wound VAC and recommended follow up with surgery in three weeks actually with Dr. Delaine Lame who is infectious disease. The patient was on vancomycin during the hospital course. 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 He also had a bone culture which revealed evidence of methicillin-resistant staph aureus. He is updated CT scan 07/09/18 reveals that he had progression of the which was performed  on wound to breakdown down to the trochanter where he actually had irregularities there as well suggestive of osteomyelitis. This was a change just since 9 December when we last performed a CT scan. Obviously this one had gone downhill quite significantly and rapidly. At this point upon evaluation I feel like in general the patient's wound seems to be doing fairly well all things considered upon my evaluation today. Obviously this is larger and deeper than what I previously evaluated but at the same time he seems to be making some progress as far as the appearance of the granulation tissue is concerned. I'm happy in that regard. No fevers, chills, nausea, or vomiting noted at this time. He is on IV vancomycin and Rocephin at the facility. He is currently in [facility]. 08/03/18 upon evaluation today patient's wound appears to be doing better in regard to the overall appearance at this point in time. Fortunately he's been tolerating the Wound VAC without complication and states that the facility has been taking excellent care of the wound site. Overall I see some Slough noted on the surface which I am going to attempt sharp debridement today of but nonetheless other than this  I feel like he's making progress. 08/09/18 on evaluation today patient's wound appears to be doing much better compared to even last week's evaluation. Do believe that the Wound VAC is been of great benefit for him. He has been tolerating the dressing changes that is the Wound VAC without any complication and he has excellent granulation noted currently. There is no need for sharp debridement at this point. 08/16/18 on evaluation today patient actually appears to be doing very well in regard to the wound in the right gluteal fold region. This is showing signs of progress and again appears to be very healthy which is excellent news. Fortunately there is no sign of active infection by way of odor or drainage at this point.  Overall I'm very pleased with how things stand. He seems to be tolerating the Wound VAC without complication. 08/23/18 on evaluation today patient actually appears to be doing better in regard to his wound. He has been tolerating the Wound VAC without complication and in fact it has been collecting a significant amount of drainage which I think is good news especially considering how the wound appears. Fortunately there is no signs of infection at this time definitely nothing appears to be worse which is good news. He has not been started on the Bactrim and Flagyl that was recommended by Dr. Delaine Lame yet. I did actually contact her office this morning in order to check and see were things are that regard their gonna be calling me back. 08/30/18 on evaluation today patient actually appears to show signs of excellent improvement today compared to last evaluation. The undermining is getting much better the wound seems to be feeling quite nicely and I'm very pleased that the granulation in general. With that being said overall I feel like the patient has made excellent progress which is great news. No fevers, chills, nausea, or vomiting noted at this time. 09/06/18 on evaluation today patient actually appears to be doing rather well in regard to his right gluteal ulcer. This is showing signs of improvement in overall I'm very pleased with how things seem to be progressing. The patient likewise is please. Overall I see no evidence of infection he is about to complete his oral antibiotic regimen which is the end of the antibiotics for him in just about three days. JARAD, KINCHELOE ([id number]) 09/13/18 on evaluation today patient's right Ischial ulcer appears to be showing signs of continued improvement which is excellent news. He's been tolerating the dressing changes without complication. Fortunately there's no signs of infection and the wound that seems to be doing very well. 09/28/18 on evaluation today  patient appears to be doing rather well in regard to his right Ischial ulcer. He's been tolerating the Wound VAC without complication he knows there's much less drainage than there used to be this obviously is not a bad thing in my pinion. There's no evidence of infection despite the fact is but nothing about it now for several weeks. 10/04/18 on evaluation today patient appears to be doing better in regard to his right Ischial wound. He has been tolerating the Wound VAC without complication and I do believe that the silver nitrate last week was beneficial for him. Fortunately overall there's no evidence of active infection at this time which is great news. No fevers, chills, nausea, or vomiting noted at this time. 10/11/18 on evaluation today patient actually appears to be doing rather well in regard to his Ischial ulcer. He's been tolerating the Wound VAC still  without complication I feel like this is doing a good job. No fevers, chills, nausea, or vomiting noted at this time. 11/01/18 on evaluation today patient presents after having not been seen in our clinic for several weeks secondary to the fact that he was on evaluation today patient presents after having not been seen in our clinic for several weeks secondary to the fact that he was in a skilled nursing facility which was on lockdown currently due to the covert 19 national emergency. Subsequently he was discharged from the facility on this past Friday and subsequently made an appointment to come in to see yesterday. Fortunately there's no signs of active infection at this time which is good news and overall he does seem to have made progress since I last [name]. Overall I feel like things are progressing quite nicely. The patient is having no pain. 11/08/18 on evaluation today patient appears to be doing okay in regard to his right gluteal ulcer. He has been utilizing a Wound VAC home health this changing this at this point since he's home from the  skilled nursing facility. Fortunately there's no signs of obvious active infection at this time. Unfortunately though there's no obvious active infection he is having some maceration and his wife states that when the sheets of the Wound VAC office on Sunday when it broke seal that he ended up having significant issues with some smell as well there concerned about the possibility of infection. Fortunately there's No fevers, chills, nausea, or vomiting noted at this time. 11/15/18 on evaluation today patient actually appears to be doing well in regard to his right gluteal ulcer. He has been tolerating the dressing changes without complication. Specifically the Wound VAC has been utilized up to this point. Fortunately there's no signs of infection and overall I feel like he has made progress even since last week when I last [name] him. I'm actually fairly happy with the overall appearance although he does seem to have somewhat of a hyper granular overgrowth in the central portion of the wound which I think may require some sharp debridement to try flatness out possibly utilizing chemical cauterization following. 11/23/18 on evaluation today patient actually appears to be doing very well in regard to his sacral ulcer. He seems to be showing signs of improvement with good granulation. With that being said he still has the small area of hyper granulation right in the central portion of the wound which I'm gonna likely utilize silver nitrate on today. Subsequently he also keeps having a leak at the 6 o'clock location which is unfortunate we may be able to help out with some suggestions to try to prevent this going forward. Fortunately there's no signs of active infection at this time. 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 He's been tolerating the dressing changes without complication. Fortunately there's no signs  of active infection at this time. I've been rather pleased with how things have progressed there still some evidence of pressure getting to the area with some redness right around the immediate wound opening. Nonetheless other than this I'm not seeing any significant complications or issues the wound is somewhat hyper granular. Upon discussing with the patient and his wife today I'm not sure that the wound is being packed to the base with the foam at this point. And if it's not been packed fully that may be part of the reason  why is not seen as much improvement as far as the granulation from the base out. Again we do not want pack too tightly but we need some of the firm to get to the base of the wound. I discussed this with patient and his wife today. 12/06/18 on evaluation today patient appears to be doing well in regard to his right gluteal pressure ulcer. He's been tolerating the dressing changes without complication. Fortunately there's no signs of active infection. He still has some hyper granular tissue and I do think it would be appropriate to continue with the chemical cauterization as of today. 12/16/18 on evaluation today patient actually appears to be doing okay in regard to his right gluteal ulcer. He is been tolerating the dressing changes without complication including the Wound VAC. Overall I feel like nothing seems to be worsening I do feel like that the hyper granulation buds in the central portion of the wound have improved to some degree with the silver nitrate. We will have to see how things continue to progress. 12/20/18 on evaluation today patient actually appears to be doing much worse in my pinion even compared to last week's evaluation. Unfortunately as opposed to showing any signs of improvement the areas of hyper granular tissue in the central portion of the wound seem to be getting worse. Subsequently the wound bed itself also seems to be getting deeper even compared to last  week which is both unusual as well as concerning since prior he had been shown signs of improvement. Nonetheless I think that the issue could be that he's actually having some difficulty in issues with a deeper infection. There's no external signs of infection but nonetheless I am more worried about the internal, osteomyelitis, that could be restarting. He has not been on antibiotics for some time at this point. I think that it may be a good idea to go ahead and started back on an antibiotic therapy while we wait to see what the testing shows. 12/27/18 on evaluation today patient presents for follow-up concerning his left gluteal fold wound. Fortunately he appears to be doing well today. I did review the CT scan which was negative for any signs of osteomyelitis or acute abnormality this is excellent news. Overall I feel like the surface of the wound bed appears to be doing significantly better today compared to previously noted findings. There does not appear any signs of infection nor does he have any pain at this time. 01/03/19 on evaluation today patient actually appears to be doing quite well in regard to his ulcer. Post debridement last week he really did not have too much bleeding which is good news. Fortunately today this seems to be doing some better but we still has some of the hyper granular tissue noted in the base of the wound which is gonna require sharp debridement today as well. Overall I'm pleased with how things seem to be progressing since we switched away from the Wound VAC I think he is making some progress. 01/10/19 on evaluation today patient appears to be doing better in regard to his right gluteal fold ulcer. He has been tolerating the dressing changes without complication. The debridement to seem to be helping with current away some of the poor hyper granular tissue bugs throughout the region of his gluteal fold wound. He's been tolerating the dressing changes otherwise without  complication which is great news. No fevers, chills, nausea, or vomiting noted at this time. 01/17/19 on evaluation today patient actually  appears to be doing excellent in regard to his wound. He's been tolerating the dressing changes FARREN, DIMITRIOU. ([id number]) without complication. Fortunately there is no signs of active infection at this time which is great news. No fevers, chills, nausea, or vomiting noted at this time. 01/24/19 on evaluation today patient actually appears to be doing quite well with regard to his ulcer. He has been tolerating the dressing changes without complication. Fortunately there's no signs of active infection at this time. Overall been very pleased with the progress that he seems to be making currently. 01/31/19-Patient returns at 1 week with apparent similarity in dimensions to the wound, with no signs of infection, he has been changing dressings twice a day 02/08/19 upon evaluation today patient actually appears to be doing well with regard to his right Ischial ulcer. The wound is not appear to be quite as deep and seems to be making progress which is good news. With that being said I'm still reluctant to go back to the Wound VAC at this point. He's been having to change the dressings twice a day which is a little bit much in my pinion from the wound care supplies standpoint. I think that possibly attempting to utilize extras orbit may be beneficial this may also help to prevent any additional breakdown secondary to fluid retention in the wound itself. The patient is in agreement with giving this a try. 02/15/19 on evaluation today patient actually appears to be doing decently well with regard to his ulcer in the right to gluteal fold location. He's been tolerating the dressing changes without complication. Fortunately there is no signs of active infection at this time. He is able to keep the current dressing in place more effectively for a day at a time whereas before  he was having a changes to to three times a day. The actions or has been helpful in this regard. Fortunately there's no signs of anything getting worse and I do feel like he showing signs of good improvement with regard to the wound bed status. 02/22/2019 patient appears to be doing very well today with regard to his ulcer in the gluteal fold. Fortunately there is no signs of active infection and he has been tolerating the dressing changes without any complication. Overall extremely pleased with how things seem to be progressing. He has much less of the hyper granular projections within the wound these have slowly been debrided away and he seems to be doing well. The wound bed is more uniform. 03/01/19 on evaluation today patient appears to be doing unfortunately about the same in regard to his gluteal ulcer. He's been tolerating the dressing changes without complication. Fortunately there's no signs of active infection at this time. With that being said he continues to develop these hyper granular projections which I'm unsure of exactly what they are and why they are rising. Nonetheless I explained to the patient that I do believe it would be a good idea for [place] to stand a biopsy sample for pathology to see if that can shed any light on what exactly may be going on here. Fortunately I do not see any obvious signs of infection. With that being said the patient has had a little bit more drainage this week apparently compared to last week. 03/08/2019 on evaluation today patient actually appears to look somewhat better with regard to the appearance of his wound bed at this time. This is good news. Overall I am very pleased with how things seem to  have progressed just in the past week with a switch to the [facility] dressing. I think that has been beneficial for him. With that being said at this time the patient is concerned about his biopsy that I sent off last week unfortunately I do not have that  report as of yet. Nonetheless we have called to obtain this and hopefully will hear back from the lab later this morning. 03/15/19 on evaluation today patient's wound actually appears to be doing okay today with regard to the overall appearance of the wound bed. He has been tolerating the dressing changes without complication he still has hyper granular tissue noted but fortunately that seems to be minimal at this point compared to some of what we've seen in the past. Nonetheless I do think that he is still having some issues currently with some of this type of granulation the biopsy and since all showed nothing more than just evidence of granulation tissue. Therefore there really is nothing different to initiate or do at this point. 03/24/19 on evaluation today patient appears to be doing a little better with regard to his ulcer. He's been tolerating the dressing changes without complication. Fortunately there is no signs of active infection at this time. No fevers, chills, nausea, or vomiting noted at this time. I'm overall pleased with how things seem to be progressing. 03/29/2019 on evaluation today patient appears to be doing about the same in regard to his ulcer in the right gluteal fold. Unfortunately he is not seeming to make a lot of progress and the wound is somewhat stalled. There is no signs of active infection externally but I am concerned about the possibility of infection continuing in the ischial location which previously he did have infection noted. Again is not able to have an MRI so probably our best option for testing for this would be a triple phase bone scan which will detect subtle changes in the bone more so than plain film x-rays. In the past they really have not been beneficial and in fact the CT scans even have been somewhat questionable at times. Nonetheless there is no signs of systemic infection which is at least good news but again his wound is not healing at all on the  predicted schedule. 04/05/19 on evaluation today patient appears to be doing well all things considering with regard to his wound and the right gluteal fold. He actually has his triple bone scan scheduled for sometime in the next couple of days. With that being said I've also been looking to other possibilities of what could be causing hyper granular tissue were looking into the bone scan again for evaluating for the risk or possibility of infection deeper and I'm also gonna go ahead and see about obtaining a deep tissue culture today to send and see if there's any evidence of infection noted on culture. He's in agreement with that plan. 04/12/2019 on evaluation today patient presents for reevaluation here in our clinic concerning ongoing issues with his right gluteal fold ulcer. 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 It was discussed with him at that point that I think it would be appropriate for him to proceed with hyperbaric oxygen therapy especially in light of the fact that he is previously been on IV antibiotics at the beginning of the year for close to 3 months followed by several weeks of oral  antibiotics that was all prescribed by infectious disease. He had surgical debridement around Christmas December 2019 due to an abscess and osteomyelitis of the ischial bone. Unfortunately this has not really proceeded as well as we would have liked and again we did a CT scan even a couple months ago as he cannot have an MRI secondary to having issues with both a pain pump as well as a spinal cord stimulator which prevent him from going into an MRI machine. With that being said there were chronic changes noted at the ischial tuberosity which had progressed since December 2019 there was no evidence of fluid collection on that initial CT scan. With that being said that was on January 21, 2019. I am not sure that it was a sensitive enough test as compared to an MRI and then subsequently I ordered a bone scan for him which was actually completed on 03/29/2019 and this revealed that he does indeed have positive osteomyelitis involving the right ischial tuberosity. This is adjacent to the ulcer and I think is the reason that his ulcer is not healing. Subsequently I am in a place him back on oral antibiotics today unfortunately his wound culture showed just mixed gram-negative flora with no specific findings of a predominant organism. Nonetheless being with the location I think a good broad-spectrum antibiotic for gram-negative's is a good choice at this point he is previously taken Levaquin without significant issues and I think that is an appropriate antibiotic for him at this time. 04/19/2019 on evaluation today patient actually appears to be doing fairly well with regard to his wound. He has been tolerating the dressing changes without complication. Fortunately there is no signs of active infection and he has been taking the oral antibiotics at this time. BOBBY, KROSCHEL ([id number]) Subsequently we did make a referral to infectious disease although Dr. Steva Ready wants the patient to be seen by general surgery first in order apparently to see if there is anything from a surgical standpoint that should be done prior to initiation of IV antibiotic therapy. Again the patient is okay with actually seeing Dr. Celine Ahr whom he has seen before we will make that referral. 04/26/2019 on evaluation today patient actually appears to be doing well with regard to his wound. He has been tolerating the dressing changes without complication. Fortunately there is no signs of active infection at this time. I do believe that the hyperbaric oxygen therapy along with the antibiotics which I prescribed at this point have been doing well for him. With that being said he has not seen Dr. Celine Ahr the surgeon yet  in fact they have not been contacted for scheduling an appointment as of yet. Subsequently the patient is not on antibiotics currently by IV Dr. Steva Ready did want him to see Dr. Celine Ahr first which we are working on trying to get scheduled. We again give the information to the patient today for Dr. Celine Ahr and her number as far as contacting their office to see about getting something scheduled. Again were looking for whether or not they recommend any surgical intervention. 05/03/2019 on evaluation today patient appears to be doing about the same with regard to his wound there may be a little bit of filling in in regard to the base of the wound but still he has quite a bit of hyper granular tissue that I am seeing today. I believe that he may may need a more aggressive sharp debridement possibly even by the surgeon or under anesthesia  in order to clear away some of the hyper granular material in order to help allow for appropriate granulation to fill in. We have made a referral to Dr. Celine Ahr unfortunately it sounds as if they did not receive the referral we contacted them today they should be get in touch with the patient. Depending on how things show from that standpoint the patient may need to see Dr. Ardyth Gal who is the infectious disease specialist although he really does not want a PICC line again. No fevers, chills, nausea, vomiting, or diarrhea. He is tolerating hyperbaric oxygen therapy very well. 05/12/2019 on evaluation today patient presents for follow-up visit concerning his right gluteal fold ulcer. He did see Dr. Celine Ahr the surgeon who previously evaluated his wound and she actually felt like he was doing quite well with regard to the wound based on what she was seen. He does seem to be responding to some degree with the oral antibiotics along with hyperbaric oxygen therapy at this point she did not see any evidence or need for further surgical intervention at this point. She  recommended deferring additional or ongoing antibiotic therapy to Dr. Steva Ready at her discretion. Fortunately there is no signs of active systemic infection. No fevers, chills, nausea, vomiting, or diarrhea. 10/29; patient I am seeing really for the first time today in terms of his wound. 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 He is being treated with hyperbaric oxygen for underlying osteomyelitis most recently documented by a three-phase bone scan. He has been on Levaquin for roughly a month and he is out of these and asked me for a refill. Most recent cultures were negative although I do not think these were bone cultures. He has a very irregular surface to this wound which is almost nodular in texture. It undermines superiorly with the same nodular hyper granulated surface. I see that he was referred to general surgery for an operative debridement although the surgeon did not agree with this I think that would have been the proper course of action in this. He has been using Hydrofera Blue. He is supposed to start a wound VAC tomorrow which is actually a reapplication of wound VAC. I think mostly last time they had trouble keeping the seal in place I hope we have better look at this this time. 05/24/2019 on evaluation today I did review patient's note from Dr. Dellia Nims where he was seen last week when I was out of the office. Subsequently it does appear that Dr. Dellia Nims was in agreement that the patient really needed debridement to clear away some of the nodular tissue in the base of the wound. The good news is he has the wound VAC initiated and some of this tissue is already starting to breakdown under the treatment of the wound VAC again because it is not really viable. Nonetheless I do believe that we can likely perform some debridement today to clear this away and hopefully the continuation of the wound VAC along with hyperbarics and antibiotic therapy will be beneficial  in stopping this from reoccurring. If we get better control in the wound bed in a good spot I think we can definitely use the PriMatrix which we actually did get approval for. 05/31/2019 on evaluation today patient appears to be doing actually pretty well at this point with regard to his wound. He has been tolerating the dressing changes which is good news. Fortunately there is no signs of infection. He still has some  of the hyper granular nodules noted we have not really cleared all these away and I think we can try to do a little bit more that today. I did do quite a bit last week I am hoping to get down to the base of the wound so though actually be able to heal more appropriately. I think the wound VAC is doing a good job right now with controlling the drainage and overall the patient is happy with that. 06/07/2019 upon evaluation today patient appears to be doing quite well compared to last week with regard to the hyper granulation in the base of the wound. He has been tolerating the dressing changes without complication. Fortunately there is no signs of active infection at this time. With that being said we have not had any recent measures as far as blood work is concerned I think we may want to check a few things including a CBC, sed rate, and C-reactive protein. Patient is in agreement with that we can try to get that scheduled for him for this Friday. 06/13/2019 on evaluation today patient actually appears to be doing much better at this point based on what I am seeing with regard to his wound. He has much less in the way of hyper granular tissue which is good news and a lot more healing that is taken place since I last [name] him as far as the amount of space within the wound itself. With that being said he is nearing the completion of the initial 40 treatments for hyperbaric oxygen therapy I think this is something I would recommend extending as he does seem to be benefiting at this time.  Fortunately there is no evidence of active systemic infection at this point nor even local infection for that matter. 06/20/2019 upon evaluation today patient actually appears to be doing well with regard to his wound. In fact this appears to be doing much better today compared to what it was previous. I been extremely pleased with the progress that he is made. In fact the tunnel is much less deep today than what it was in the past and I am seeing excellent signs of improvement. Overall I am happy with how things are going. I did review his white blood cell count which revealed everything to be okay. The one abnormal finding on his CBC was a hemoglobin of 12.7. Subsequently I did also review his sed rate and C-reactive protein. His sed rate measured in at normal level measuring at 26. The C-reactive protein however was elevated today and an abnormal range indicating inflammation. Still I feel like he is trending in a good direction at this time. He is going to need a refill of the Levaquin he tells me at this point. 07/04/2019 on evaluation today patient actually appears to be doing well in regard to his wound. He has been tolerating the dressing changes without complication. Fortunately there is no signs of active infection which is good news. No fevers, chills, nausea, vomiting, or diarrhea. The good news also is that I did review his labs and though his sed rate was slightly elevated the C-reactive protein was actually down compared to the last evaluation 2 weeks ago this is excellent news I think it is showing signs of improvement. 07/11/2019 on evaluation today patient actually appears to be doing quite well with regard to his wound on my evaluation. This is very slow going but nonetheless he seems to be making progress. Especially in regard to the depth.  Nonetheless I believe that we may want to consider going forward with the PriMatrix which was previously approved for him. We will get a have  to get a new approval as we will actually be applying this in the new year so we will likely obtain that approval on January 4. In the meantime and for the time being my suggestion is going to be that we go ahead and continue with the wound VAC and we were going at this point with the current wound care measures. 12/28-Patient seen after HBO session today, wound depth with the tunnel measures 3.2 cm, the wound bed appears healthy, continuing with the VAC and HBO [name], [name] ([id number]) 07/25/2019 on evaluation today patient's wound appears to be doing about the same compared to my last evaluation. Fortunately there is no evidence of significant infection which is good news we will still waiting on the results of his lab work which unfortunately was not done prior to today's visit which is what we were aiming for. Ultimately there is no sign of active infection at this time which is good news. And he also tells me that the wound VAC has been staying in place and doing fairly well which is also good news. With that being said I am concerned about the fact that the wound has not progressed as much I think we may want to look into getting reapproval for the skin substitute, specifically PriMatrix, that I am hopeful will help to allow this area to granulate in more effectively. I would recommend as well continuing with the wound VAC along with this. 08/01/2019 on evaluation today patient appears to be doing in my opinion slightly better in regard to the wound. He actually does have some tightening of the walls around the edges which is a good thing. Fortunately there is no signs of active infection at this time. No fever chills noted. I do believe that the patient in general seems to be making progress although is very slow. Obviously were trying to get things to speed up somewhat more. No fevers, chills, nausea, vomiting, or diarrhea. 08/08/2019 upon evaluation today patient appears to be doing  well with regard to his wound although there is not a significant improvement overall we do have the PriMatrix for availability today to apply. Fortunately there is no signs of active infection and the patient overall seems to be doing well. There is really no need for aggressive sharp debridement at this point either which is also good news. I do believe her get a need to suture and the primary checks in the basin in the PriMatrix in order to keep it in place at the base of the wound underneath the wound VAC. This was discussed with the patient today as well. He is in agreement the plan. He really does not have any filling in the area and so this should not be a complication as far as suturing is concerned. 08/15/2019 on evaluation today patient appears to be doing somewhat better in my opinion in regard to the overall appearance of his wound. He has been tolerating the dressing changes without complication. Fortunately the skin subappear to still be in place when this was changed on Friday. With that being said it appears that the PriMatrix may have dissolved in the interim between now and then as the sutures are no longer holding anything in place and again I would sutured it into place. Nonetheless I believe that we can go ahead and likely consider  going forward that we may need to apply the PriMatrix weekly as opposed every other week as it does seem to have dissolved rather readily and well. Overall the wound bed appears to be doing excellent at this time. 08/22/2019 upon evaluation today the patient does seem to making some progress with regard to his wound. Overall there is some better quality tissue and less of the hyper granular protrusions all of which is good news. With that being said we do have the PriMatrix available for application today and I am in a try to find a better way to suture this in place for him today. 08/29/2019 on evaluation today patient appears to be doing very well in  regard to his wound. I do see signs of new granulation which seems to be somewhat helpful he did have some need for sharp debridement today to clear some of the hyper granulation away but nonetheless I feel like we are headed in a good direction. Fortunately there is no evidence of any systemic infection although we are going to go ahead and reorder the CBC, C-reactive protein, and sed rate today to see where things stand I did just refill his Levaquin as well. 09/05/2019 upon evaluation today patient's wound actually is not appearing to be showing signs of significant improvement compared to what we have seen previous from the standpoint of depth. He overall wound size wise seems to be about the same and again some of the quality of the wound bed is still doing fairly well. I did review his labs and his CBC was normal with no signs of elevation white blood cell count, his C-reactive protein was normal, and the sed rate was slightly elevated but again I think this may just be due to ulceration really there is nothing significant to indicate severe infection. I think when he likely finishes his current round of oral antibiotics with Levaquin will likely take him off of the medication at that point based on the findings. I do feel like the hyperbarics helped in getting the infection under control as well as improving the overall size of the wound quite significantly. With that being said at this point I really feel like more more at the time frame that he may benefit from a muscle flap in order to try to help this wound heal effectively. I think that this would be the fastest way in order to do this. In the past he has been somewhat resistant to going down that path but nonetheless I feel like he likely should. 09/12/2019 on evaluation today patient appears to be doing fairly well in regard to his wound. He is tolerating the wound VAC without complication is still continues to drain significantly.  Fortunately there is no signs of active infection at this time which is great news his lab work is also been doing very well. With that being said I do think at this point that the patient is still deriving benefit from the wound VAC especially in regard to controlling moisture which I think it is doing fairly well. He also has some improvement in the overall wound bed and on the medial aspect of the wound there are some hyper granular projections but nothing as severe as what they have been in the past. I am good have to perform some sharp debridement at this location today. 09/19/2019 upon evaluation today patient appears to be doing roughly the same although again the tissue does seem to be showing some signs of improvement  which is good news. There is less hyper granular budding. Nonetheless he still is going require some sharp debridement today. We did get his albumin and prealbumin results back and they are within normal limits in both cases which is excellent news that was the one thing [facility] plastic surgery was waiting on order to get him scheduled. He does seem to be a good candidate physically for the flap surgery if he decides to go that direction. With that being said is still left to be determined once they see the wound what they feel about how best to proceed. Obviously in the end it would be up to Mr. Sunn as to whether he goes forward with the flap surgery or something else depending on the recommendations. 09/26/2019 upon evaluation today patient appears to be doing about the same in regard to his wound. Fortunately there is no signs of active infection there is also not as much hyper granulation noted today in fact I do not believe there is can be any sharp debridement required at this point. With that being said I do believe he continues to be somewhat stalled at this point with regard to his wound. There is no signs of active infection at this time which is good news. He does have  an appointment with [facility] plastic surgery on Friday. 10/03/2019 upon evaluation today patient appears to be doing somewhat poorly especially in his overall demeanor compared to last week's evaluation. He unfortunately did see the physician at [facility] plastic surgery. Unfortunately the plan for the possibility of a flap would be a fairly significant surgery that the patient is really not wanting to take the risk of as if the surgery failed he would end up with a very large open wound at that point. Fortunately there is no signs of active infection at this time. No fevers, chills, nausea, vomiting, or diarrhea. With that being said there was some question that the physician had about why this would not be healing and the possibility that there may be some bone still needs to be removed deeper in the wound. Again the bone scan that we did previous actually did show evidence of there still being a chronic osteomyelitis back on March 29, 2019. With that being said he did undergo treatment both with hyperbarics as well as oral antibiotics for quite some time since that point. Obviously he may need additional and updated imaging with the surgeon in order to see if there is anything else that can or will need to be done in order to help this area to heal. Obviously I still believe as well that there is something going on here. He is already seeing the surgeon that performed the original surgery after the bone scan but they did not feel that anything needed to be done from a surgical standpoint at that time. 10/10/2019 upon evaluation today patient appears to be really doing about the same with regard to his wound in the gluteal fold region. KENYAN, MUZIK ([id number]) Unfortunately he is not making a lot of improvement the insurance also contacted home health and apparently the wound VAC is no longer to be covered due to lack of progress. Obviously that is something that we discussed as well as a strong  possibility for happening soon and this was even last week that discussed this with him. With that being said again we have tried to manage this wound as best as I could up to this point. Fortunately there is no evidence  of active infection at this time. No fevers, chills, nausea, vomiting, or diarrhea. 10/17/2019 upon evaluation today patient has decided to proceed with the flap surgery. He actually seems to be in better spirits today he states he wants to get this healed and after talking to the surgeon last week he feels like that is his best option for doing so. With that being said he also needs to have a referral to infectious disease to be seen for IV antibiotics preoperatively and postoperatively as far as the skin flap is concerned. He wonders if Dr. Steva Ready would be interested or available to do this for him. I explained that I would definitely have to check with her and I will be happy to do that but I am not certain What her answer will be in 2 I have a discussion with her. 10/24/2019 upon evaluation today patient appears to be doing about the same with regard to his wound although he is complaining of increased pain he is really not had any discomfort he states that it is getting much worse at this point. Fortunately there is no signs of active infection. At least not systemically. With that being said I question whether local infection could be a problem here secondary to the fact that to be honest the patient is having pain where he has not previous. 10/31/2019 upon evaluation today patient appears to be doing about the same in regard to his wound in the right gluteal fold region. Subsequently in the interim since I last [name] him last week I did receive a message from Dr. Steva Ready as well as reviewed her note. She was requesting the potential for a bone culture or at least a deep tissue culture in order to see about helping with more directed antibiotic therapy for the  patient's projected flap surgery. If we could not obtain more specific organisms for treatment then obviously should be looking at more broad-spectrum antibiotic therapy. With that being said at this point the patient seems ready to move forward with the flap surgery. I did actually sign the paperwork for his bed which came through last week I believe that was on Thursday or Friday. Either way that should be coming to him as well so he will have that in place once the surgery occurs when he is discharged home. Fortunately there is no signs of active infection at this time. No fevers, chills, nausea, vomiting, or diarrhea. 11/15/2019 upon evaluation today patient appears to be doing about the same with regard to his right gluteal fold ulcer. He has been tolerating the dressing changes without complication right now he is awaiting the hospital bed with air mattress in order to be able to be scheduled for his flap surgery. He is on IV vancomycin at this point is managed by Dr. Steva Ready. 11/30/2019; right gluteal fold extending over the ischial tuberosity. He had underlying osteomyelitis and there is still managing on vancomycin directed by infectious disease. His hospital bed and level 3 surface are arriving. After that there is plans for a myocutaneous flap at [facility] They state that he will be in hospital for 2 weeks and for at least 2 weeks in a skilled facility may be longer 12/13/2019 upon evaluation today patient's wound appears to be doing about the same. Dr. Steva Ready actually did come for an evaluation of the patient here in office as well as she cannot really get him up on the bed in order to examine him in her office. Subsequently I  did review his well his labs his C-reactive protein and sed rate have been doing excellent he actually appears to be maintaining quite nicely in my opinion. Was still having to pack the wound as it is quite deep and obviously he is going to proceed with the  flap surgery but nonetheless I think that he is on the right track in that regard. He is currently still on the IV vancomycin although he does end on this on June 3. His surgery is scheduled for June 15. Objective Constitutional Well-nourished and well-hydrated in no acute distress. Vitals Time Taken: 8:08 AM, Height: 73 in, Weight: 210 lbs, BMI: 27.7, Temperature: 98.6 F, Pulse: 67 bpm, Respiratory Rate: 16 breaths/min, Blood Pressure: 144/72 mmHg. Respiratory normal breathing without difficulty. Psychiatric this patient is able to make decisions and demonstrates good insight into disease process. Alert and Oriented x 3. pleasant and cooperative. General Notes: Patient's wound again appears to be doing about the same he does have a fairly deep wound that is still requiring packing at this point unfortunately but again nothing appears to be worsened with any signs of active infection visually and overall very pleased with how things seem to be doing obviously I think he is well prepared for the surgery that is upcoming. Integumentary (Hair, Skin) Wound #1 status is Open. Original cause of wound was Pressure Injury. The wound is located on the Right Gluteal fold. The wound measures 4cm length x 1.5cm width x 2.2cm depth; 4.712cm^2 area and 10.367cm^3 volume. There is Fat Layer (Subcutaneous Tissue) Exposed exposed. There is a large amount of serous drainage noted. The wound margin is epibole. There is large (67-100%) pink, hyper - granulation within the wound bed. There is a small (1-33%) amount of necrotic tissue within the wound bed including Adherent Slough. 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 - in office Cleanse wound with mild soap and water Anesthetic (add to Medication List): Wound #1 Right Gluteal fold: Topical Lidocaine 4% cream applied to wound bed prior to debridement (In Clinic Only). Skin Barriers/Peri-Wound Care: Skin Prep Primary Wound Dressing: Wound #1 Right Gluteal fold: Silver Alginate Secondary Dressing: Wound #1 Right Gluteal fold: Tegaderm XtraSorb Dressing Change Frequency: Wound #1 Right Gluteal fold: Change dressing every other day. Follow-up Appointments: Wound #1 Right Gluteal fold: Return Appointment in 1 week. - Thursday Off-Loading: Wound #1 Right Gluteal fold: Turn and reposition every 2 hours - no pressure on wounded area Home Health: Wound #1 Right Gluteal fold: Continue Home Health Visits - [place] Nurse may visit PRN to address patient s wound care needs. FACE TO FACE ENCOUNTER: MEDICARE and MEDICAID PATIENTS: I certify that this patient is under my care and that I had a face-to-face encounter that meets the physician face-to-face encounter requirements with this patient on this date. The encounter with the patient was in whole or in part for the following MEDICAL CONDITION: (primary reason for [place]) MEDICAL NECESSITY: I certify, that based on my findings, NURSING services are a medically necessary home health service. HOME BOUND STATUS: I certify that my clinical findings support that this patient is homebound (i.e., Due to illness or injury, pt requires aid of supportive devices such as crutches, cane, wheelchairs, walkers, the  use of special transportation or the assistance of another person to leave their place of residence. There is a normal inability to leave the home and doing so requires considerable and taxing effort. Other absences are for medical reasons / religious services and are infrequent or of short duration when for other reasons). If current  dressing causes regression in wound condition, may D/C ordered dressing product/s and apply Normal Saline Moist Dressing daily until next [place] / Other MD appointment. [place] of regression in wound condition at [phone]. Please direct any NON-WOUND related issues/requests for orders to patient's Primary Care Physician 1. He will continue with taking the IV antibiotics until June 3 at which point that will be discontinued though the PICC line will be left in place for the surgery. 2. The patient will have his flap surgery on June 15. 3. I am going to plan to see him Thursday prior to his surgery in order to make sure that we do not need to perform any deep wound culture per Dr. Raelene Bott recommendation. The patient is in agreement with that plan. We will see patient back for reevaluation in 1 week here in the clinic. If anything worsens or changes patient will contact our office for additional recommendations. Electronic Signature(s) Signed: 12/13/2019 12:25:40 PM By: Conchita Paris, [place] ([id number]) Entered By: Worthy Keeler on 12/13/2019 12:25:39 IREOLUWA, SOISSON ([id number]) -------------------------------------------------------------------------------- SuperBill Details Patient Name: [name] Date of Service: 12/13/2019 Medical Record Number: [id number] Patient Account Number: [id number] Date of Birth/Sex: 1951-05-03 (69 y.o. 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, sequela I10  Essential (primary) hypertension Facility Procedures CPT4 Code: [id number] Description: 99213 - WOUND CARE VISIT-LEV 3 EST PT Modifier: Quantity: 1 Physician Procedures CPT4 [id number] Description: [id number] - WC PHYS LEVEL 4 - EST PT Modifier: Quantity: 1 CPT4 Code: Description: ICD-10 Diagnosis Description L89.314 Pressure ulcer of right buttock, stage 4 M86.68 Other chronic osteomyelitis, other site L03.317 Cellulitis of buttock G82.20 Paraplegia, unspecified Modifier: Quantity: Electronic Signature(s) Signed: 12/13/2019 12:25:58 PM By: Worthy Keeler [name] Entered By: Worthy Keeler on 12/13/2019 12:25:57

## 2019-12-13 NOTE — Progress Notes (Signed)
FONNIE, WELBURN ([id number]) Visit Report for 12/13/2019 Arrival Information Details Patient Name: MASSAI, MANDUJANO Date of Service: 12/13/2019 8:00 AM Medical Record Number: [id number] Patient Account Number: [id number] Date of Birth/Sex: 08-20-1950 (69 y.o. M) Treating RN: Army Melia Primary Care Jasmina Gendron: [name] Other Clinician: Referring Eliud Polo: [name] Treating Rosa Gambale/Extender: [name], HOYT Weeks in Treatment: 106 Visit Information History Since Last Visit Added or deleted any medications: No Patient Arrived: Wheel Chair Any new allergies or adverse reactions: No Arrival Time: 08:08 Had a fall or experienced change in No Accompanied By: wife activities of daily living that may affect Transfer Assistance: None risk of falls: Patient Requires Transmission-Based Precautions: No Signs or symptoms of abuse/neglect since last visito No Patient Has Alerts: Yes Hospitalized since last visit: No Patient Alerts: NOT Diabetic Has Dressing in Place as Prescribed: Yes Pain Present Now: No Electronic Signature(s) Signed: 12/13/2019 11:41:03 AM By: Army Melia Entered By: Army Melia on 12/13/2019 08:08:58 [name] ([id number]) -------------------------------------------------------------------------------- Clinic Level of Care Assessment Details Patient Name: [name] Date of Service: 12/13/2019 8:00 AM Medical Record Number: [id number] Patient Account Number: [id number] Date of Birth/Sex: 1950/08/22 (69 y.o. M) Treating RN: Montey Hora Primary Care Tahirih Lair: [name] Other Clinician: Referring Brynlea Spindler: [name] Treating Luretha Eberly/Extender: [name], HOYT Weeks in Treatment: 106 Clinic Level of Care Assessment Items TOOL 4 Quantity Score []  - Use when only an EandM is performed on FOLLOW-UP visit 0 ASSESSMENTS - Nursing Assessment / Reassessment X - Reassessment of Co-morbidities (includes updates in  patient status) 1 10 X- 1 5 Reassessment of Adherence to Treatment Plan ASSESSMENTS - Wound and Skin Assessment / Reassessment X - Simple Wound Assessment / Reassessment - one wound 1 5 []  - 0 Complex Wound Assessment / Reassessment - multiple wounds []  - 0 Dermatologic / Skin Assessment (not related to wound area) ASSESSMENTS - Focused Assessment []  - Circumferential Edema Measurements - multi extremities 0 []  - 0 Nutritional Assessment / Counseling / Intervention []  - 0 Lower Extremity Assessment (monofilament, tuning fork, pulses) []  - 0 Peripheral Arterial Disease Assessment (using hand held doppler) ASSESSMENTS - Ostomy and/or Continence Assessment and Care []  - Incontinence Assessment and Management 0 []  - 0 Ostomy Care Assessment and Management (repouching, etc.) PROCESS - Coordination of Care X - Simple Patient / Family Education for ongoing care 1 15 []  - 0 Complex (extensive) Patient / Family Education for ongoing care X- 1 10 Staff obtains Programmer, systems, Records, Test Results / Process Orders []  - 0 Staff telephones HHA, Nursing Homes / Clarify orders / etc []  - 0 Routine Transfer to another Facility (non-emergent condition) []  - 0 Routine Hospital Admission (non-emergent condition) []  - 0 New Admissions / Biomedical engineer / Ordering NPWT, Apligraf, etc. []  - 0 Emergency Hospital Admission (emergent condition) X- 1 10 Simple Discharge Coordination []  - 0 Complex (extensive) Discharge Coordination PROCESS - Special Needs []  - Pediatric / Minor Patient Management 0 []  - 0 Isolation Patient Management []  - 0 Hearing / Language / Visual special needs []  - 0 Assessment of Community assistance (transportation, D/C planning, etc.) []  - 0 Additional assistance / Altered mentation []  - 0 Support Surface(s) Assessment (bed, cushion, seat, etc.) INTERVENTIONS - Wound Cleansing / Measurement Saban, Braedan J. ([id number]) X- 1 5 Simple Wound Cleansing - one  wound []  - 0 Complex Wound Cleansing - multiple wounds X- 1 5 Wound Imaging (photographs - any number of wounds) []  - 0 Wound Tracing (instead of photographs) X-  1 5 Simple Wound Measurement - one wound []  - 0 Complex Wound Measurement - multiple wounds INTERVENTIONS - Wound Dressings X - Small Wound Dressing one or multiple wounds 1 10 []  - 0 Medium Wound Dressing one or multiple wounds []  - 0 Large Wound Dressing one or multiple wounds []  - 0 Application of Medications - topical []  - 0 Application of Medications - injection INTERVENTIONS - Miscellaneous []  - External ear exam 0 []  - 0 Specimen Collection (cultures, biopsies, blood, body fluids, etc.) []  - 0 Specimen(s) / Culture(s) sent or taken to Lab for analysis []  - 0 Patient Transfer (multiple staff / Civil Service fast streamer / Similar devices) []  - 0 Simple Staple / Suture removal (25 or less) []  - 0 Complex Staple / Suture removal (26 or more) []  - 0 Hypo / Hyperglycemic Management (close monitor of Blood Glucose) []  - 0 Ankle / Brachial Index (ABI) - do not check if billed separately X- 1 5 Vital Signs Has the patient been seen at the hospital within the last three years: Yes Total Score: 85 Level Of Care: New/Established - Level 3 Electronic Signature(s) Signed: 12/13/2019 12:56:57 PM By: Montey Hora Entered By: Montey Hora on 12/13/2019 08:42:24 [name] ([id number]) -------------------------------------------------------------------------------- Encounter Discharge Information Details Patient Name: [name] Date of Service: 12/13/2019 8:00 AM Medical Record Number: [id number] Patient Account Number: [id number] Date of Birth/Sex: 1951-01-16 (69 y.o. M) Treating RN: Montey Hora Primary Care Rashi Granier: [name] Other Clinician: Referring Rondi Ivy: [name] Treating Akash Winski/Extender: [name], HOYT Weeks in Treatment: 106 Encounter Discharge Information  Items Discharge Condition: Stable Ambulatory Status: Wheelchair Discharge Destination: Home Transportation: Private Auto Accompanied By: wife Schedule Follow-up Appointment: Yes Clinical Summary of Care: Electronic Signature(s) Signed: 12/13/2019 12:56:57 PM By: Montey Hora Entered By: Montey Hora on 12/13/2019 08:43:50 [name] ([id number]) -------------------------------------------------------------------------------- Lower Extremity Assessment Details Patient Name: [name] Date of Service: 12/13/2019 8:00 AM Medical Record Number: [id number] Patient Account Number: [id number] Date of Birth/Sex: 08-Feb-1951 (69 y.o. M) Treating RN: Army Melia Primary Care Quinetta Shilling: [name] Other Clinician: Referring Aerik Polan: [name] Treating Terianna Peggs/Extender: [name], HOYT Weeks in Treatment: 106 Electronic Signature(s) Signed: 12/13/2019 11:41:03 AM By: Army Melia Entered By: Army Melia on 12/13/2019 08:17:42 ENO, DENECKE ([id number]) -------------------------------------------------------------------------------- Multi Wound Chart Details Patient Name: [name] Date of Service: 12/13/2019 8:00 AM Medical Record Number: [id number] Patient Account Number: [id number] Date of Birth/Sex: Nov 14, 1950 (69 y.o. M) Treating RN: Montey Hora Primary Care Kanija Remmel: [name] Other Clinician: Referring Lateasha Breuer: [name] Treating Marvel Mcphillips/Extender: [name], HOYT Weeks in Treatment: 106 Vital Signs Height(in): 73 Pulse(bpm): 43 Weight(lbs): 210 Blood Pressure(mmHg): 144/72 Body Mass Index(BMI): 28 Temperature(F): 98.6 Respiratory Rate(breaths/min): 16 Photos: [N/A:N/A] Wound Location: Right Gluteal fold N/A N/A Wounding Event: Pressure Injury N/A N/A Primary Etiology: Pressure Ulcer N/A N/A Comorbid History: Cataracts, Hypertension, Paraplegia N/A N/A Date Acquired: 11/02/2017 N/A N/A Weeks of  Treatment: 106 N/A N/A Wound Status: Open N/A N/A Measurements L x W x D (cm) 4x1.5x2.2 N/A N/A Area (cm) : 4.712 N/A N/A Volume (cm) : 10.367 N/A N/A % Reduction in Area: 53.10% N/A N/A % Reduction in Volume: -931.50% N/A N/A Classification: Category/Stage IV N/A N/A Exudate Amount: Large N/A N/A Exudate Type: Serous N/A N/A Exudate Color: amber N/A N/A Wound Margin: Epibole N/A N/A Granulation Amount: Large (67-100%) N/A N/A Granulation Quality: Pink, Hyper-granulation N/A N/A Necrotic Amount: Small (1-33%) N/A N/A Exposed Structures: Fat Layer (Subcutaneous Tissue) N/A N/A Exposed: Yes Fascia:  No Tendon: No Muscle: No Joint: No Bone: No Epithelialization: Small (1-33%) N/A N/A Treatment Notes Electronic Signature(s) Signed: 12/13/2019 12:56:57 PM By: Montey Hora Entered By: Montey Hora on 12/13/2019 08:37:28 PANAGIOTIS, MACINNES ([id number]) -------------------------------------------------------------------------------- [place] Details Patient Name: [name] Date of Service: 12/13/2019 8:00 AM Medical Record Number: [id number] Patient Account Number: [id number] Date of Birth/Sex: Dec 19, 1950 (69 y.o. M) Treating RN: Montey Hora Primary Care Aidin Doane: [name] Other Clinician: Referring Maykayla Highley: [name] Treating Mohanad Carsten/Extender: [name], HOYT Weeks in Treatment: 106 Active Inactive Osteomyelitis Nursing Diagnoses: Infection: osteomyelitis Goals: Patient's osteomyelitis will resolve Date Initiated: 08/29/2019 Target Resolution Date: 09/28/2019 Goal Status: Active Signs and symptoms for osteomyelitis will be recognized and promptly addressed Date Initiated: 08/29/2019 Target Resolution Date: 09/28/2019 Goal Status: Active Interventions: Assess for signs and symptoms of osteomyelitis resolution every visit Treatment Activities: Systemic antibiotics : 08/29/2019 Notes: Pressure Nursing Diagnoses: Knowledge  deficit related to management of pressures ulcers Goals: Patient/caregiver will verbalize understanding of pressure ulcer management Date Initiated: 05/17/2018 Target Resolution Date: 05/28/2018 Goal Status: Active Interventions: Assess: immobility, friction, shearing, incontinence upon admission and as needed Notes: Wound/Skin Impairment Nursing Diagnoses: Impaired tissue integrity Goals: Patient/caregiver will verbalize understanding of skin care regimen Date Initiated: 11/30/2017 Target Resolution Date: 12/21/2017 Goal Status: Active Ulcer/skin breakdown will have a volume reduction of 30% by week 4 Date Initiated: 11/30/2017 Target Resolution Date: 12/21/2017 Goal Status: Active Interventions: Assess patient/caregiver ability to obtain necessary supplies Assess patient/caregiver ability to perform ulcer/skin care regimen upon admission and as needed Assess ulceration(s) every visit AMAAN, BOWER ([id number]) Treatment Activities: Skin care regimen initiated : 11/30/2017 Notes: Electronic Signature(s) Signed: 12/13/2019 12:56:57 PM By: Montey Hora Entered By: Montey Hora on 12/13/2019 08:36:31 GUMARO, KULIKOWSKI ([id number]) -------------------------------------------------------------------------------- Pain Assessment Details Patient Name: [name] Date of Service: 12/13/2019 8:00 AM Medical Record Number: [id number] Patient Account Number: [id number] Date of Birth/Sex: 02/26/1951 (69 y.o. M) Treating RN: Army Melia Primary Care Tayshaun Kroh: [name] Other Clinician: Referring Kenetra Hildenbrand: [name] Treating Hannan Tetzlaff/Extender: [name], HOYT Weeks in Treatment: 106 Active Problems Location of Pain Severity and Description of Pain Patient Has Paino Yes Site Locations Pain Location: Pain in Ulcers Rate the pain. Current Pain Level: 5 Pain Management and Medication Current Pain Management: Electronic Signature(s) Signed: 12/13/2019  11:41:03 AM By: Army Melia Entered By: Army Melia on 12/13/2019 08:09:46 RYOTT, CERVENY ([id number]) -------------------------------------------------------------------------------- Patient/Caregiver Education Details Patient Name: [name] Date of Service: 12/13/2019 8:00 AM Medical Record Number: [id number] Patient Account Number: [id number] Date of Birth/Gender: 02-16-1951 (69 y.o. M) Treating RN: Montey Hora Primary Care Physician: [name] Other Clinician: Referring Physician: [name] Treating Physician/Extender: Sharalyn Ink in Treatment: 106 Education Assessment Education Provided To: Patient and Caregiver Education Topics Provided Wound/Skin Impairment: Handouts: Other: wound care as ordered Methods: Demonstration, Explain/Verbal Responses: State content correctly Electronic Signature(s) Signed: 12/13/2019 12:56:57 PM By: Montey Hora Entered By: Montey Hora on 12/13/2019 08:43:18 Piano, [name] -------------------------------------------------------------------------------- Wound Assessment Details Patient Name: [name] Date of Service: 12/13/2019 8:00 AM Medical Record Number: [id number] Patient Account Number: [id number] Date of Birth/Sex: 1950/11/10 (69 y.o. M) Treating RN: Army Melia Primary Care Valary Manahan: [name] Other Clinician: Referring Myalynn Lingle: [name] Treating Desia Saban/Extender: [name], HOYT Weeks in Treatment: 106 Wound Status Wound Number: 1 Primary Etiology: Pressure Ulcer Wound Location: Right Gluteal fold Wound Status: Open Wounding Event: Pressure Injury Comorbid History: Cataracts, Hypertension, Paraplegia Date Acquired: 11/02/2017 Weeks Of Treatment: 106 Clustered Wound:  No Photos Wound Measurements Length: (cm) 4 Width: (cm) 1.5 Depth: (cm) 2.2 Area: (cm) 4.712 Volume: (cm) 10.367 % Reduction in Area: 53.1% % Reduction in Volume:  -931.5% Epithelialization: Small (1-33%) Wound Description Classification: Category/Stage IV Fo Wound Margin: Epibole Sl Exudate Amount: Large Exudate Type: Serous Exudate Color: amber ul Odor After Cleansing: No ough/Fibrino Yes Wound Bed Granulation Amount: Large (67-100%) Exposed Structure Granulation Quality: Pink, Hyper-granulation Fascia Exposed: No Necrotic Amount: Small (1-33%) Fat Layer (Subcutaneous Tissue) Exposed: Yes Necrotic Quality: Adherent Slough Tendon Exposed: No Muscle Exposed: No Joint Exposed: No Bone Exposed: No Treatment Notes Wound #1 (Right Gluteal fold) Notes S.cell, xsorb, tegaderm Electronic Signature(s) Signed: 12/13/2019 11:41:03 AM By: Thornell Sartorius, [name] Entered By: Army Melia on 12/13/2019 08:17:25 [name] ([id number]) -------------------------------------------------------------------------------- Vitals Details Patient Name: [name] Date of Service: 12/13/2019 8:00 AM Medical Record Number: [id number] Patient Account Number: [id number] Date of Birth/Sex: 04-29-1951 (69 y.o. M) Treating RN: Army Melia Primary Care Melea Prezioso: [name] Other Clinician: Referring Rissa Turley: [name] Treating Anise Harbin/Extender: [name], HOYT Weeks in Treatment: 106 Vital Signs Time Taken: 08:08 Temperature (F): 98.6 Height (in): 73 Pulse (bpm): 67 Weight (lbs): 210 Respiratory Rate (breaths/min): 16 Body Mass Index (BMI): 27.7 Blood Pressure (mmHg): 144/72 Reference Range: 80 - 120 mg / dl Electronic Signature(s) Signed: 12/13/2019 11:41:03 AM By: Army Melia Entered By: Army Melia on 12/13/2019 08:09:39

## 2019-12-22 ENCOUNTER — Other Ambulatory Visit: Payer: Self-pay

## 2019-12-22 ENCOUNTER — Encounter: Payer: Medicare Other | Attending: Physician Assistant | Admitting: Physician Assistant

## 2019-12-22 DIAGNOSIS — S34109S Unspecified injury to unspecified level of lumbar spinal cord, sequela: Secondary | ICD-10-CM | POA: Diagnosis not present

## 2019-12-22 DIAGNOSIS — L89314 Pressure ulcer of right buttock, stage 4: Secondary | ICD-10-CM | POA: Insufficient documentation

## 2019-12-22 DIAGNOSIS — I10 Essential (primary) hypertension: Secondary | ICD-10-CM | POA: Diagnosis not present

## 2019-12-22 DIAGNOSIS — X58XXXS Exposure to other specified factors, sequela: Secondary | ICD-10-CM | POA: Insufficient documentation

## 2019-12-22 DIAGNOSIS — G8220 Paraplegia, unspecified: Secondary | ICD-10-CM | POA: Insufficient documentation

## 2019-12-22 NOTE — Progress Notes (Addendum)
[name], [name] ([id number]) Visit Report for 12/22/2019 Chief Complaint Document Details Patient Name: [name], [name] Date of Service: 12/22/2019 12:30 PM Medical Record Number: [id number] Patient Account Number: [id number] Date of Birth/Sex: 01-09-1951 (69 y.o. M) Treating RN: Army Melia Primary Care Provider: [name] Other Clinician: Referring Provider: [name] Treating Provider/Extender: [name], Johnryan Sao Weeks in Treatment: 107 Information Obtained from: Patient Chief Complaint Right gluteal fold ulcer Electronic Signature(s) Signed: 12/22/2019 1:03:27 PM By: Worthy Keeler [name] Entered By: Worthy Keeler on 12/22/2019 13:03:27 [name], [name] ([id number]) -------------------------------------------------------------------------------- HPI Details Patient Name: [name] Date of Service: 12/22/2019 12:30 PM Medical Record Number: [id number] Patient Account Number: [id number] Date of Birth/Sex: 1951-04-04 (69 y.o. 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 He states that there is no history of diabetes that he is aware of. He does have issues with his prostate and is currently receiving treatment for this by way of oral medication. With that being said I do not have a lot of details in that regard. Nonetheless the patient presents today as a result of having been referred to [place] by another provider initially home health was set to come out and take care of his wound although due to the fact that  he apparently drives he's not able to receive home health. His wife is therefore trying to help take care of this wound within although they have been struggling with what exactly to do at this point. She states that she can do some things but she is definitely not a nurse and does have some issues with looking at blood. The good news is the wound does not appear to be too deep and is fairly superficial at this point. There is no slough noted there is some nonviable skin noted around the surface of the wound and the perimeter at this point. The central portion of the wound appears to be very good with a dermal layer noted this does not appear to be again deep enough to extend it to subcutaneous tissue at this point. 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 In the pump he has Dilaudid and baclofen. 12/07/17 on evaluation today patient presents for follow-up concerning his ongoing lower back thigh ulcer on the right. He states that he did not get the supplies ordered and therefore has not really been able to perform the dressing changes as directed exactly. His wife was able to get some Boarder Foam Dressing's from the drugstore and subsequently has been using hydrogel which did help to a degree in the wound does appear to be able smaller. There is actually more drainage this week noted than previous. 12/21/17 on evaluation today patient appears to be doing rather well in regard to his right gluteal ulcer. He has been tolerating the dressing changes without complication. There does not appear to be any evidence of infection at this point in time. Overall the wound does seem to be making some progress as far as the edges  are concerned there's not as much in the way of overlapping of the external wound edges and he has a good epithelium to wound bed border for the most part. This however is not true  right at the 12 o'clock location over the span of a little over a centimeters which actually will require debridement today to clean this away and hopefully allow it to continue to heal more appropriately. 12/28/17 on evaluation today patient appears to be doing rather well in regard to his ulcer in the left gluteal region. He's been tolerating the dressing changes without complication. Apparently he has had some difficulty getting his dressing material. Apparently there's been some confusion with ordering we're gonna check into this. Nonetheless overall he's been showing signs of improvement which is good news. Debridement is not required today. 01/04/18 on evaluation today patient presents for follow-up concerning his right gluteal ulcer. He has been tolerating the dressing changes fairly well. On inspection today it appears he may actually have some maceration them concerned about the fact that he may be developing too much moisture in and around the wound bed which can cause delay in healing. With that being said he unfortunately really has not showed significant signs of improvement since last week's evaluation in fact this may even be just the little bit/slightly larger. Nonetheless he's been having a lot of discomfort I'm not sure this is even related to the wound as he has no pain when I'm to breeding or otherwise cleaning the wound during evaluation today. Nonetheless this is something that we did recommend he talked to his pain specialist concerning. 01/11/18 on evaluation today patient appears to be doing better in regard to his ulceration. He has been tolerating the dressing changes without complication. With that being said overall there's no evidence of infection which is good news. The only thing is he did receive the hatch affair blue classic versus the ready nonetheless I feel like this is perfectly fine and appears to have done well for him over the past week. 01/25/18 on evaluation today  patient's wound actually appears to be a little bit larger than during the last evaluation. The good news is the majority of the wound edges actually appear to be fairly firmly attached to the wound bed unfortunately again we're not really making progress in regard to the size. Roughly the wound is about the same size as when I first [name] him although again the wound margin/edges appear to be much better. 02/01/18 on evaluation today patient actually appears to be doing very well in regard to his wound. Applying the Prisma dry does seem to be better although he does still have issues with slow progression of the wound. There was a slight improvement compared to last week's measurements today. Nonetheless I have been considering other options as far as the possibility of Theraskin or even a snap vac. In general I'm not sure that the Theraskin due to location of the wound would be a very good idea. Nonetheless I do think that a snap vac could be a possibility for the patient and in fact I think this could even be an excellent way to manage the wound possibly seeing some improvement in a very rapid fashion here. Nonetheless this is something that we would need to get approved and I did have a lengthy conversation with the patient about this today. 02/08/18 on evaluation today patient appears to be doing a little better in regard to his ulcer. He has been tolerating  the dressing changes without complication. Fortunately despite the fact that the wound is a little bit smaller it's not significantly so unfortunately. We have discussed the possibility of a snap vac we did check with insurance this is actually covered at this point. Fortunately there does not appear to be any sign of infection. Overall I'm fairly pleased with how things seem to be appearing at this point. 02/15/18 on evaluation today patient appears to be doing rather well in regard to his right gluteal ulcer. Unfortunately the snap vac did not  stay in place with his sheer and friction this came loose and did not seem to maintain seal very well. He worked for about two days and it did seem to do very well during that time according to his wife but in general this does not seem to be something that's gonna be beneficial for him long-term. I do believe we need to go back to standard dressings to see if we can find something that will be of benefit. 03/02/18- He is here in follow up evaluation; there is minimal change in the wound. He will continue with the same treatment plan, would consider changing to iodosrob/iodoflex if ulcer continues to to plateau. He will follow up next week [name], [name] ([id number]) 03/08/18 on evaluation today patient's wound actually appears to be about the same size as when I previously [name] him several weeks back. Unfortunately he does have some slightly dark discoloration in the central portion of the wound which has me concerned about pressure injury. I do believe he may be sitting for too long a period of time in fact he tells me that "I probably sit for much too long". He does have some Slough noted on the surface of the wound and again as far as the size of the wound is concerned I'm really not seeing anything that seems to have improved significantly. 03/15/18 on evaluation today patient appears to be doing fairly well in regard to his ulcer. The wound measured pretty much about the same today compared to last week's evaluation when looking at his graph. With that being said the area of bruising/deep tissue injury that was noted last week I do not see at this point. He did get a new cushion fortunately this does seem to be have been of benefit in my pinion. It does appear that he's been off of this more which is good news as well I think that is definitely showing in the overall wound measurements. With that being said I do believe that he needs to continue to offload I don't think that the fact this is  doing better should be or is going to allow him to not have to offload and explain this to him as well. Overall he seems to be in agreement the plan I think he understands. The overall appearance of the wound bed is improved compared to last week I think the Iodoflex has been beneficial in that regard. 03/29/18 on evaluation today patient actually appears to be doing rather well in regard to his wound from the overall appearance standpoint he does have some granulation although there's some Slough on the surface of the wound noted as well. With that being said he unfortunately has not improved in regard to the overall measurement of the wound in volume or in size. I did have a discussion with him very specifically about offloading today. He actually does work although he mainly is just sitting throughout the day. He tells me  he offloads by "lifting himself up for 30 seconds off of his chair occasionally" purchase from [employer] which does seem to have helped. And he has a new cushion that he with that being said he's also able to stand some for a very short period of time but not significant enough I think to provide appropriate offloading. I think the biggest issue at this point with the wound and the fact is not healing as quickly as we would like is due to the fact that he is really not able to appropriately offload while at work. He states the beginning after his injury he actually had a bed at his job that he could lay on in order to offload and that does seem to have been of help back at that time. Nonetheless he had not done this in quite some time unfortunately. I think that could be helpful for him this is something I would like for him to look into. 04/05/18 on evaluation today patient actually presents for follow-up concerning his right gluteal ulcer. Again he really is not significantly improved even compared to last week. He has been tolerating the dressing changes without  complication. With that being said fortunately there appears to be no evidence of infection at this time. He has been more proactive in trying to offload. 04/12/18 on evaluation today patient actually appears to be doing a little better in regard to his wound and the right gluteal fold region. He's been tolerating the dressing changes since removing the oasis without complication. However he was having a lot of burning initially with the oasis in place. He's unsure of exactly why this was given so much discomfort but he assumes that it was the oasis itself causing the problem. Nonetheless this had to be removed after about three days in place although even those three days seem to have made a fairly good improvement in regard to the overall appearance of the wound bed. In fact is the first time that he's made any improvement from the standpoint of measurements in about six weeks. He continues to have no discomfort over the area of the wound itself which leads me to wonder why he was having the burning with the oasis when he does not even feel the actual debridement's themselves. I am somewhat perplexed by this. 04/19/18 on evaluation today patient's wound actually appears to be showing signs of epithelialization around the edge of the wound and in general actually appears to be doing better which is good news. He did have the same burning after about three days with applying the Endoform last week in the same fashion that I would generally apply a skin substitute. This seems to indicate that it's not the oasis to cause the problem but potentially the moisture buildup that just causes things to burn or there may be some other reaction with the skin prep or Steri-Strips. Nonetheless I'm not sure that is gonna be able to tolerate any skin substitute for a long period of time. The good news is the wound actually appears to be doing better today compared to last week and does seem to finally be making some  progress. 04/26/18 on evaluation today patient actually appears to be doing rather well in regard to his ulcer in the right gluteal fold. He has been tolerating the dressing changes without complication which is good news. The Endoform does seem to be helping although he was a little bit more macerated this week. This seems to be an  ongoing issue with fluid control at this point. Nonetheless I think we may be able to add something like Drawtex to help control the drainage. 05/03/18 on evaluation today patient appears to actually be doing better in regard to the overall appearance of his wound. He has been tolerating the dressing changes without complication. Fortunately there appears to be no evidence of infection at this time. I really feel like his wound has shown signs as of today of turning around last week I thought so as well and definitely he could be seen in this week's overall appearance and measurements. In general I'm very pleased with the fact that he finally seems to be making a steady but sure progress. The patient likewise is very pleased. 05/17/18 on evaluation today patient appears to be doing more poorly unfortunately in regard to his ulcer. He has been tolerating the dressing changes without complication. With that being said he tells me that in the past couple of days he and his wife have noticed that we did not seem to be doing quite as well is getting dark near the center. Subsequently upon evaluation today the wound actually does appear to be doing worse compared to previous. He has been tolerating the dressing changes otherwise and he states that he is not been sitting up anymore than he was in the past from what he tells me. Still he has continued to work he states "I'm tired of dealing with this and if I have to just go home and lay in the bed all the time that's what I'll do". Nonetheless I am concerned about the fact that this wound does appear to be deeper than what it  was previous. 05/24/18 upon evaluation today patient actually presents after having been in the hospital due to what was presumed to be sepsis secondary to the wound infection. He had an elevated white blood cell count between 14 and 15. With that being said he does seem to be doing somewhat better now. His wound still is giving him some trouble nonetheless and he is obviously concerned about the fact likely talked about that this does seem to go more deeply than previously noted. I did review his wound culture which showed evidence of Staphylococcus aureus him and group B strep. Nonetheless he is on antibiotics, Levaquin, for this. Subsequently I did review his intake summary from the hospital as well. I also did look at the CT of the lumbar spine with contrast that was performed which showed no bone destruction to suggest lumbar disguises/osteomyelitis or sacral osteomyelitis. There was no paraspinal abscess. Nonetheless it appears this may have been more of just a soft tissue infection at this point which is good news. He still is nonetheless concerned about the wound which again I think is completely reasonable considering everything he's been through recently. 05/31/18 on evaluation today on evaluation today patient actually appears to be showing signs of his wound be a little bit deeper than what I would like to see. Fortunately he does not show any signs of significant infection although his temperature was 99 today he states he's been checking this at home and has not been elevated. Nonetheless with the undermining that I'm seeing at this point I am becoming more concerned about the wound I do think that offloading is a key factor here that is preventing the speedy recovery at this point. There does not appear to be any evidence of again over infection noted. He's been using Santyl currently. 06/07/18 the  patient presents today for follow-up evaluation regarding the left ulcer in the gluteal  region. He has been tolerating the Wound VAC Gatt, Kaenan J. ([id number]) fairly well. He is obviously very frustrated with this he states that to mean is really getting in his way. There does not appear to be any evidence of infection at this time he does have a little bit of odor I do not necessarily associate this with infection just something that we sometimes notice with Wound VAC therapy. With that being said I can definitely catch a tone of discontentment overall in the patient's demeanor today. This when he was previously in the hospital an CT scan was done of the lumbar region which did not reveal any signs of osteomyelitis. With that being said the pelvis in particular was not evaluated distinctly which means he could still have some osteonecrosis I. Nonetheless the Wound VAC was started on Thursday I do want to get this little bit more time before jumping to a CT scan of the pelvis although that is something that I might would recommend if were not see an improvement by that time. 06/14/18 on evaluation today patient actually appears to be doing about the same in regard to his right gluteal ulcer. Again he did have a CT scan of the lumbar spine unfortunately this did not include the pelvis. Nonetheless with the depth of the wound that I'm seeing today even despite the fact that I'm not seeing any evidence of overt cellulitis I believe there's a good chance that we may be dealing with osteomyelitis somewhere in the right Ischial region. No fevers, chills, nausea, or vomiting noted at this time. 06/21/18 on evaluation today patient actually appears to be doing about the same with regard to his wound. The tunnel at 6 o'clock really does not appear to be any deeper although it is a little bit wider. I think at this point you may want to start packing this with white phone. Unfortunately I have not got approval for the CT scan of the pelvis as of yet due to the fact that Medicare apparently  has been denied it due to the diagnosis codes not being appropriate according to Medicare for the test requested. With that being said the patient cannot have an MRI and therefore this is the only option that we have as far as testing is concerned. The patient has had infection and was on antibiotics and been added code for cellulitis of the bottom to see if this will be appropriate for getting the test approved. Nonetheless I'm concerned about the infection have been spread deeper into the Ischial region. 06/28/18 on evaluation today patient actually appears to be doing rather well all things considered in regard to the right gluteal ulcer. He has been tolerating the dressing changes without complication. With that being said the Wound VAC he states does have to be replaced almost every day or at least reinforced unfortunately. Patient actually has his CT scan later this morning we should have the results by tomorrow. 07/05/18 on evaluation today patient presents for follow-up concerning his right Ischial ulcer. He did see the surgeon Dr. Lysle Pearl last week. They were actually very happy with him and felt like he spent a tremendous amount of time with them as far as discussing his situation was concerned. In the end Dr. Lysle Pearl did contact me as well and determine that he would not recommend any surgical intervention at this point as he felt like it would not be  in the patient's best interest based on what he was seeing. He recommended a referral to infectious disease. Subsequently this is something that Dr. Ines Bloomer office is working on setting up for the patient. As far as evaluation today is concerned the patient's wound actually appears to be worse at this point. I am concerned about how things are progressing and specifically about infection. I do not feel like it's the deeper but the area of depth is definitely widening which does have me concerned. No fevers, chills, nausea, or vomiting noted at this  time. I think that we do need initiate antibiotic therapy the patient has an allow allergy to amoxicillin/penicillin he states that he gets a rash since childhood. Nonetheless she's never had the issues with Catholics or cephalosporins in general but he is aware of. 07/27/18 on evaluation today patient presents following admission to the hospital on 07/09/18. He was subsequently discharged on 07/20/18. On 07/15/18 the patient underwent irrigation and debridement was soft tissue biopsy and bone biopsy as well as placement of a Wound VAC in the OR by Dr. Celine Ahr. During the hospital course the patient was placed on a Wound VAC and recommended follow up with surgery in three weeks actually with Dr. Delaine Lame who is infectious disease. The patient was on vancomycin during the hospital course. 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 He also had a bone culture which revealed evidence of methicillin-resistant staph aureus. He is updated CT scan 07/09/18 reveals that he had progression of the which was performed on wound to breakdown down to the trochanter where he actually had irregularities there as well suggestive of osteomyelitis. This was a change just since 9 December when we last performed a CT scan. Obviously this one had gone downhill quite significantly and rapidly. At this point upon evaluation I feel like in general the patient's wound seems to be doing fairly well all things considered upon my evaluation today. Obviously this is larger and deeper than what I previously evaluated but at the same time he seems to be making some progress as far as the appearance of the granulation tissue is concerned. I'm happy in that regard. No fevers, chills, nausea, or vomiting noted at this time. He is on IV vancomycin and Rocephin at the facility. He is currently in [employer]. 08/03/18 upon evaluation today patient's wound appears to be doing better in regard to the  overall appearance at this point in time. Fortunately he's been tolerating the Wound VAC without complication and states that the facility has been taking excellent care of the wound site. Overall I see some Slough noted on the surface which I am going to attempt sharp debridement today of but nonetheless other than this I feel like he's making progress. 08/09/18 on evaluation today patient's wound appears to be doing much better compared to even last week's evaluation. Do believe that the Wound VAC is been of great benefit for him. He has been tolerating the dressing changes that is the Wound VAC without any complication and he has excellent granulation noted currently. There is no need for sharp debridement at this point. 08/16/18 on evaluation today patient actually appears to be doing very well in regard to the wound in the right gluteal fold region. This is showing signs of progress and again appears to be very healthy which is excellent news. Fortunately there is no sign of active infection by way of odor or drainage at this point. Overall I'm very  pleased with how things stand. He seems to be tolerating the Wound VAC without complication. 08/23/18 on evaluation today patient actually appears to be doing better in regard to his wound. He has been tolerating the Wound VAC without complication and in fact it has been collecting a significant amount of drainage which I think is good news especially considering how the wound appears. Fortunately there is no signs of infection at this time definitely nothing appears to be worse which is good news. He has not been started on the Bactrim and Flagyl that was recommended by Dr. Delaine Lame yet. I did actually contact her office this morning in order to check and see were things are that regard their gonna be calling me back. 08/30/18 on evaluation today patient actually appears to show signs of excellent improvement today compared to last evaluation. The  undermining is getting much better the wound seems to be feeling quite nicely and I'm very pleased that the granulation in general. With that being said overall I feel like the patient has made excellent progress which is great news. No fevers, chills, nausea, or vomiting noted at this time. 09/06/18 on evaluation today patient actually appears to be doing rather well in regard to his right gluteal ulcer. This is showing signs of improvement in overall I'm very pleased with how things seem to be progressing. The patient likewise is please. Overall I see no evidence of infection he is about to complete his oral antibiotic regimen which is the end of the antibiotics for him in just about three days. 09/13/18 on evaluation today patient's right Ischial ulcer appears to be showing signs of continued improvement which is excellent news. He's been tolerating the dressing changes without complication. Fortunately there's no signs of infection and the wound that seems to be doing very well. 09/28/18 on evaluation today patient appears to be doing rather well in regard to his right Ischial ulcer. He's been tolerating the Wound VAC Peets, Hubbard J. ([id number]) without complication he knows there's much less drainage than there used to be this obviously is not a bad thing in my pinion. There's no evidence of infection despite the fact is but nothing about it now for several weeks. 10/04/18 on evaluation today patient appears to be doing better in regard to his right Ischial wound. He has been tolerating the Wound VAC without complication and I do believe that the silver nitrate last week was beneficial for him. Fortunately overall there's no evidence of active infection at this time which is great news. No fevers, chills, nausea, or vomiting noted at this time. 10/11/18 on evaluation today patient actually appears to be doing rather well in regard to his Ischial ulcer. He's been tolerating the Wound VAC  still without complication I feel like this is doing a good job. No fevers, chills, nausea, or vomiting noted at this time. 11/01/18 on evaluation today patient presents after having not been seen in our clinic for several weeks secondary to the fact that he was on evaluation today patient presents after having not been seen in our clinic for several weeks secondary to the fact that he was in a skilled nursing facility which was on lockdown currently due to the covert 19 national emergency. Subsequently he was discharged from the facility on this past Friday and subsequently made an appointment to come in to see yesterday. Fortunately there's no signs of active infection at this time which is good news and overall he does seem to have made  progress since I last [name]. Overall I feel like things are progressing quite nicely. The patient is having no pain. 11/08/18 on evaluation today patient appears to be doing okay in regard to his right gluteal ulcer. He has been utilizing a Wound VAC home health this changing this at this point since he's home from the skilled nursing facility. Fortunately there's no signs of obvious active infection at this time. Unfortunately though there's no obvious active infection he is having some maceration and his wife states that when the sheets of the Wound VAC office on Sunday when it broke seal that he ended up having significant issues with some smell as well there concerned about the possibility of infection. Fortunately there's No fevers, chills, nausea, or vomiting noted at this time. 11/15/18 on evaluation today patient actually appears to be doing well in regard to his right gluteal ulcer. He has been tolerating the dressing changes without complication. Specifically the Wound VAC has been utilized up to this point. Fortunately there's no signs of infection and overall I feel like he has made progress even since last week when I last [name] him. I'm actually fairly happy  with the overall appearance although he does seem to have somewhat of a hyper granular overgrowth in the central portion of the wound which I think may require some sharp debridement to try flatness out possibly utilizing chemical cauterization following. 11/23/18 on evaluation today patient actually appears to be doing very well in regard to his sacral ulcer. He seems to be showing signs of improvement with good granulation. With that being said he still has the small area of hyper granulation right in the central portion of the wound which I'm gonna likely utilize silver nitrate on today. Subsequently he also keeps having a leak at the 6 o'clock location which is unfortunate we may be able to help out with some suggestions to try to prevent this going forward. Fortunately there's no signs of active infection at this time. 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 He's been tolerating the dressing changes without complication. Fortunately there's no signs of active infection at this time. I've been rather pleased with how things have progressed there still some evidence of pressure getting to the area with some redness right around the immediate wound opening. Nonetheless other than this I'm not seeing any significant complications or issues the wound is somewhat hyper granular. Upon discussing with the patient and his wife today I'm not sure that the wound is being packed to the base with the foam at this point. And if it's not been packed fully that may be part of the reason why is not seen as much improvement as far as the granulation from the base out. Again we do not want pack too tightly but we need some of the firm to get to the base of the wound. I discussed this with patient and his wife today. 12/06/18 on evaluation today patient appears to be doing well in regard to his right gluteal pressure  ulcer. He's been tolerating the dressing changes without complication. Fortunately there's no signs of active infection. He still has some hyper granular tissue and I do think it would be appropriate to continue with the chemical cauterization as of today. 12/16/18 on evaluation today patient actually appears to be doing okay in regard to his right gluteal ulcer. He is been tolerating the dressing  changes without complication including the Wound VAC. Overall I feel like nothing seems to be worsening I do feel like that the hyper granulation buds in the central portion of the wound have improved to some degree with the silver nitrate. We will have to see how things continue to progress. 12/20/18 on evaluation today patient actually appears to be doing much worse in my pinion even compared to last week's evaluation. Unfortunately as opposed to showing any signs of improvement the areas of hyper granular tissue in the central portion of the wound seem to be getting worse. Subsequently the wound bed itself also seems to be getting deeper even compared to last week which is both unusual as well as concerning since prior he had been shown signs of improvement. Nonetheless I think that the issue could be that he's actually having some difficulty in issues with a deeper infection. There's no external signs of infection but nonetheless I am more worried about the internal, osteomyelitis, that could be restarting. He has not been on antibiotics for some time at this point. I think that it may be a good idea to go ahead and started back on an antibiotic therapy while we wait to see what the testing shows. 12/27/18 on evaluation today patient presents for follow-up concerning his left gluteal fold wound. Fortunately he appears to be doing well today. I did review the CT scan which was negative for any signs of osteomyelitis or acute abnormality this is excellent news. Overall I feel like the surface of the wound bed  appears to be doing significantly better today compared to previously noted findings. There does not appear any signs of infection nor does he have any pain at this time. 01/03/19 on evaluation today patient actually appears to be doing quite well in regard to his ulcer. Post debridement last week he really did not have too much bleeding which is good news. Fortunately today this seems to be doing some better but we still has some of the hyper granular tissue noted in the base of the wound which is gonna require sharp debridement today as well. Overall I'm pleased with how things seem to be progressing since we switched away from the Wound VAC I think he is making some progress. 01/10/19 on evaluation today patient appears to be doing better in regard to his right gluteal fold ulcer. He has been tolerating the dressing changes without complication. The debridement to seem to be helping with current away some of the poor hyper granular tissue bugs throughout the region of his gluteal fold wound. He's been tolerating the dressing changes otherwise without complication which is great news. No fevers, chills, nausea, or vomiting noted at this time. 01/17/19 on evaluation today patient actually appears to be doing excellent in regard to his wound. He's been tolerating the dressing changes without complication. Fortunately there is no signs of active infection at this time which is great news. No fevers, chills, nausea, or vomiting noted at this time. 01/24/19 on evaluation today patient actually appears to be doing quite well with regard to his ulcer. He has been tolerating the dressing changes without complication. Fortunately there's no signs of active infection at this time. Overall been very pleased with the progress that he seems to be [name], [name]. ([id number]) making currently. 01/31/19-Patient returns at 1 week with apparent similarity in dimensions to the wound, with no signs of infection, he  has been changing dressings twice a day 02/08/19 upon evaluation today patient actually  appears to be doing well with regard to his right Ischial ulcer. The wound is not appear to be quite as deep and seems to be making progress which is good news. With that being said I'm still reluctant to go back to the Wound VAC at this point. He's been having to change the dressings twice a day which is a little bit much in my pinion from the wound care supplies standpoint. I think that possibly attempting to utilize extras orbit may be beneficial this may also help to prevent any additional breakdown secondary to fluid retention in the wound itself. The patient is in agreement with giving this a try. 02/15/19 on evaluation today patient actually appears to be doing decently well with regard to his ulcer in the right to gluteal fold location. He's been tolerating the dressing changes without complication. Fortunately there is no signs of active infection at this time. He is able to keep the current dressing in place more effectively for a day at a time whereas before he was having a changes to to three times a day. The actions or has been helpful in this regard. Fortunately there's no signs of anything getting worse and I do feel like he showing signs of good improvement with regard to the wound bed status. 02/22/2019 patient appears to be doing very well today with regard to his ulcer in the gluteal fold. Fortunately there is no signs of active infection and he has been tolerating the dressing changes without any complication. Overall extremely pleased with how things seem to be progressing. He has much less of the hyper granular projections within the wound these have slowly been debrided away and he seems to be doing well. The wound bed is more uniform. 03/01/19 on evaluation today patient appears to be doing unfortunately about the same in regard to his gluteal ulcer. He's been tolerating the dressing changes  without complication. Fortunately there's no signs of active infection at this time. With that being said he continues to develop these hyper granular projections which I'm unsure of exactly what they are and why they are rising. Nonetheless I explained to the patient that I do believe it would be a good idea for [place] to stand a biopsy sample for pathology to see if that can shed any light on what exactly may be going on here. Fortunately I do not see any obvious signs of infection. With that being said the patient has had a little bit more drainage this week apparently compared to last week. 03/08/2019 on evaluation today patient actually appears to look somewhat better with regard to the appearance of his wound bed at this time. This is good news. Overall I am very pleased with how things seem to have progressed just in the past week with a switch to the [facility] dressing. I think that has been beneficial for him. With that being said at this time the patient is concerned about his biopsy that I sent off last week unfortunately I do not have that report as of yet. Nonetheless we have called to obtain this and hopefully will hear back from the lab later this morning. 03/15/19 on evaluation today patient's wound actually appears to be doing okay today with regard to the overall appearance of the wound bed. He has been tolerating the dressing changes without complication he still has hyper granular tissue noted but fortunately that seems to be minimal at this point compared to some of what we've seen in the  past. Nonetheless I do think that he is still having some issues currently with some of this type of granulation the biopsy and since all showed nothing more than just evidence of granulation tissue. Therefore there really is nothing different to initiate or do at this point. 03/24/19 on evaluation today patient appears to be doing a little better with regard to his ulcer. He's been tolerating the  dressing changes without complication. Fortunately there is no signs of active infection at this time. No fevers, chills, nausea, or vomiting noted at this time. I'm overall pleased with how things seem to be progressing. 03/29/2019 on evaluation today patient appears to be doing about the same in regard to his ulcer in the right gluteal fold. Unfortunately he is not seeming to make a lot of progress and the wound is somewhat stalled. There is no signs of active infection externally but I am concerned about the possibility of infection continuing in the ischial location which previously he did have infection noted. Again is not able to have an MRI so probably our best option for testing for this would be a triple phase bone scan which will detect subtle changes in the bone more so than plain film x-rays. In the past they really have not been beneficial and in fact the CT scans even have been somewhat questionable at times. Nonetheless there is no signs of systemic infection which is at least good news but again his wound is not healing at all on the predicted schedule. 04/05/19 on evaluation today patient appears to be doing well all things considering with regard to his wound and the right gluteal fold. He actually has his triple bone scan scheduled for sometime in the next couple of days. With that being said I've also been looking to other possibilities of what could be causing hyper granular tissue were looking into the bone scan again for evaluating for the risk or possibility of infection deeper and I'm also gonna go ahead and see about obtaining a deep tissue culture today to send and see if there's any evidence of infection noted on culture. He's in agreement with that plan. 04/12/2019 on evaluation today patient presents for reevaluation here in our clinic concerning ongoing issues with his right gluteal fold ulcer. 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 It was discussed with him at that point that I think it would be appropriate for him to proceed with hyperbaric oxygen therapy especially in light of the fact that he is previously been on IV antibiotics at the beginning of the year for close to 3 months followed by several weeks of oral antibiotics that was all prescribed by infectious disease. He had surgical debridement around Christmas December 2019 due to an abscess and osteomyelitis of the ischial bone. Unfortunately this has not really proceeded as well as we would have liked and again we did a CT scan even a couple months ago as he cannot have an MRI secondary to having issues with both a pain pump as well as a spinal cord stimulator which prevent him from going into an MRI machine. With that being said there were chronic changes noted at the ischial tuberosity which had progressed since December 2019 there was no evidence of fluid collection on that initial CT scan. With that being said that was on January 21, 2019. I am not sure that it  was a sensitive enough test as compared to an MRI and then subsequently I ordered a bone scan for him which was actually completed on 03/29/2019 and this revealed that he does indeed have positive osteomyelitis involving the right ischial tuberosity. This is adjacent to the ulcer and I think is the reason that his ulcer is not healing. Subsequently I am in a place him back on oral antibiotics today unfortunately his wound culture showed just mixed gram-negative flora with no specific findings of a predominant organism. Nonetheless being with the location I think a good broad-spectrum antibiotic for gram-negative's is a good choice at this point he is previously taken Levaquin without significant issues and I think that is an appropriate antibiotic for him at this time. 04/19/2019 on evaluation today patient actually appears to be  doing fairly well with regard to his wound. He has been tolerating the dressing changes without complication. Fortunately there is no signs of active infection and he has been taking the oral antibiotics at this time. Subsequently we did make a referral to infectious disease although Dr. Steva Ready wants the patient to be seen by general surgery first in order apparently to see if there is anything from a surgical standpoint that should be done prior to initiation of IV antibiotic therapy. Again the patient is okay with actually seeing Dr. Celine Ahr whom he has seen before we will make that referral. 04/26/2019 on evaluation today patient actually appears to be doing well with regard to his wound. He has been tolerating the dressing changes MATTHIEU, LOFTUS. ([id number]) without complication. Fortunately there is no signs of active infection at this time. I do believe that the hyperbaric oxygen therapy along with the antibiotics which I prescribed at this point have been doing well for him. With that being said he has not seen Dr. Celine Ahr the surgeon yet in fact they have not been contacted for scheduling an appointment as of yet. Subsequently the patient is not on antibiotics currently by IV Dr. Steva Ready did want him to see Dr. Celine Ahr first which we are working on trying to get scheduled. We again give the information to the patient today for Dr. Celine Ahr and her number as far as contacting their office to see about getting something scheduled. Again were looking for whether or not they recommend any surgical intervention. 05/03/2019 on evaluation today patient appears to be doing about the same with regard to his wound there may be a little bit of filling in in regard to the base of the wound but still he has quite a bit of hyper granular tissue that I am seeing today. I believe that he may may need a more aggressive sharp debridement possibly even by the surgeon or under anesthesia in order to clear  away some of the hyper granular material in order to help allow for appropriate granulation to fill in. We have made a referral to Dr. Celine Ahr unfortunately it sounds as if they did not receive the referral we contacted them today they should be get in touch with the patient. Depending on how things show from that standpoint the patient may need to see Dr. Ardyth Gal who is the infectious disease specialist although he really does not want a PICC line again. No fevers, chills, nausea, vomiting, or diarrhea. He is tolerating hyperbaric oxygen therapy very well. 05/12/2019 on evaluation today patient presents for follow-up visit concerning his right gluteal fold ulcer. He did see Dr. Celine Ahr the surgeon who previously evaluated his wound and  she actually felt like he was doing quite well with regard to the wound based on what she was seen. He does seem to be responding to some degree with the oral antibiotics along with hyperbaric oxygen therapy at this point she did not see any evidence or need for further surgical intervention at this point. She recommended deferring additional or ongoing antibiotic therapy to Dr. Steva Ready at her discretion. Fortunately there is no signs of active systemic infection. No fevers, chills, nausea, vomiting, or diarrhea. 10/29; patient I am seeing really for the first time today in terms of his wound. 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 He is being treated with hyperbaric oxygen for underlying osteomyelitis most recently documented by a three-phase bone scan. He has been on Levaquin for roughly a month and he is out of these and asked me for a refill. Most recent cultures were negative although I do not think these were bone cultures. He has a very irregular surface to this wound which is almost nodular in texture. It undermines superiorly with the same nodular hyper granulated surface. I see that he was referred to general surgery for an  operative debridement although the surgeon did not agree with this I think that would have been the proper course of action in this. He has been using Hydrofera Blue. He is supposed to start a wound VAC tomorrow which is actually a reapplication of wound VAC. I think mostly last time they had trouble keeping the seal in place I hope we have better look at this this time. 05/24/2019 on evaluation today I did review patient's note from Dr. Dellia Nims where he was seen last week when I was out of the office. Subsequently it does appear that Dr. Dellia Nims was in agreement that the patient really needed debridement to clear away some of the nodular tissue in the base of the wound. The good news is he has the wound VAC initiated and some of this tissue is already starting to breakdown under the treatment of the wound VAC again because it is not really viable. Nonetheless I do believe that we can likely perform some debridement today to clear this away and hopefully the continuation of the wound VAC along with hyperbarics and antibiotic therapy will be beneficial in stopping this from reoccurring. If we get better control in the wound bed in a good spot I think we can definitely use the PriMatrix which we actually did get approval for. 05/31/2019 on evaluation today patient appears to be doing actually pretty well at this point with regard to his wound. He has been tolerating the dressing changes which is good news. Fortunately there is no signs of infection. He still has some of the hyper granular nodules noted we have not really cleared all these away and I think we can try to do a little bit more that today. I did do quite a bit last week I am hoping to get down to the base of the wound so though actually be able to heal more appropriately. I think the wound VAC is doing a good job right now with controlling the drainage and overall the patient is happy with that. 06/07/2019 upon evaluation today patient appears  to be doing quite well compared to last week with regard to the hyper granulation in the base of the wound. He has been tolerating the dressing changes without complication. Fortunately there is no signs of active infection at this time. With  that being said we have not had any recent measures as far as blood work is concerned I think we may want to check a few things including a CBC, sed rate, and C-reactive protein. Patient is in agreement with that we can try to get that scheduled for him for this Friday. 06/13/2019 on evaluation today patient actually appears to be doing much better at this point based on what I am seeing with regard to his wound. He has much less in the way of hyper granular tissue which is good news and a lot more healing that is taken place since I last [name] him as far as the amount of space within the wound itself. With that being said he is nearing the completion of the initial 40 treatments for hyperbaric oxygen therapy I think this is something I would recommend extending as he does seem to be benefiting at this time. Fortunately there is no evidence of active systemic infection at this point nor even local infection for that matter. 06/20/2019 upon evaluation today patient actually appears to be doing well with regard to his wound. In fact this appears to be doing much better today compared to what it was previous. I been extremely pleased with the progress that he is made. In fact the tunnel is much less deep today than what it was in the past and I am seeing excellent signs of improvement. Overall I am happy with how things are going. I did review his white blood cell count which revealed everything to be okay. The one abnormal finding on his CBC was a hemoglobin of 12.7. Subsequently I did also review his sed rate and C-reactive protein. His sed rate measured in at normal level measuring at 26. The C-reactive protein however was elevated today and an abnormal range  indicating inflammation. Still I feel like he is trending in a good direction at this time. He is going to need a refill of the Levaquin he tells me at this point. 07/04/2019 on evaluation today patient actually appears to be doing well in regard to his wound. He has been tolerating the dressing changes without complication. Fortunately there is no signs of active infection which is good news. No fevers, chills, nausea, vomiting, or diarrhea. The good news also is that I did review his labs and though his sed rate was slightly elevated the C-reactive protein was actually down compared to the last evaluation 2 weeks ago this is excellent news I think it is showing signs of improvement. 07/11/2019 on evaluation today patient actually appears to be doing quite well with regard to his wound on my evaluation. This is very slow going but nonetheless he seems to be making progress. Especially in regard to the depth. Nonetheless I believe that we may want to consider going forward with the PriMatrix which was previously approved for him. We will get a have to get a new approval as we will actually be applying this in the new year so we will likely obtain that approval on January 4. In the meantime and for the time being my suggestion is going to be that we go ahead and continue with the wound VAC and we were going at this point with the current wound care measures. 12/28-Patient seen after HBO session today, wound depth with the tunnel measures 3.2 cm, the wound bed appears healthy, continuing with the VAC and HBO 07/25/2019 on evaluation today patient's wound appears to be doing about the same compared to my  last evaluation. Fortunately there is no evidence of significant infection which is good news we will still waiting on the results of his lab work which unfortunately was not done prior to today's visit which is what we were aiming for. Ultimately there is no sign of active infection at this time which is  good news. And he also tells me that the wound VAC has been staying in place and doing fairly well which is also good news. With that being said I am concerned about the fact [name], [name]. ([id number]) that the wound has not progressed as much I think we may want to look into getting reapproval for the skin substitute, specifically PriMatrix, that I am hopeful will help to allow this area to granulate in more effectively. I would recommend as well continuing with the wound VAC along with this. 08/01/2019 on evaluation today patient appears to be doing in my opinion slightly better in regard to the wound. He actually does have some tightening of the walls around the edges which is a good thing. Fortunately there is no signs of active infection at this time. No fever chills noted. I do believe that the patient in general seems to be making progress although is very slow. Obviously were trying to get things to speed up somewhat more. No fevers, chills, nausea, vomiting, or diarrhea. 08/08/2019 upon evaluation today patient appears to be doing well with regard to his wound although there is not a significant improvement overall we do have the PriMatrix for availability today to apply. Fortunately there is no signs of active infection and the patient overall seems to be doing well. There is really no need for aggressive sharp debridement at this point either which is also good news. I do believe her get a need to suture and the primary checks in the basin in the PriMatrix in order to keep it in place at the base of the wound underneath the wound VAC. This was discussed with the patient today as well. He is in agreement the plan. He really does not have any filling in the area and so this should not be a complication as far as suturing is concerned. 08/15/2019 on evaluation today patient appears to be doing somewhat better in my opinion in regard to the overall appearance of his wound. He has been  tolerating the dressing changes without complication. Fortunately the skin subappear to still be in place when this was changed on Friday. With that being said it appears that the PriMatrix may have dissolved in the interim between now and then as the sutures are no longer holding anything in place and again I would sutured it into place. Nonetheless I believe that we can go ahead and likely consider going forward that we may need to apply the PriMatrix weekly as opposed every other week as it does seem to have dissolved rather readily and well. Overall the wound bed appears to be doing excellent at this time. 08/22/2019 upon evaluation today the patient does seem to making some progress with regard to his wound. Overall there is some better quality tissue and less of the hyper granular protrusions all of which is good news. With that being said we do have the PriMatrix available for application today and I am in a try to find a better way to suture this in place for him today. 08/29/2019 on evaluation today patient appears to be doing very well in regard to his wound. I do see signs of  new granulation which seems to be somewhat helpful he did have some need for sharp debridement today to clear some of the hyper granulation away but nonetheless I feel like we are headed in a good direction. Fortunately there is no evidence of any systemic infection although we are going to go ahead and reorder the CBC, C-reactive protein, and sed rate today to see where things stand I did just refill his Levaquin as well. 09/05/2019 upon evaluation today patient's wound actually is not appearing to be showing signs of significant improvement compared to what we have seen previous from the standpoint of depth. He overall wound size wise seems to be about the same and again some of the quality of the wound bed is still doing fairly well. I did review his labs and his CBC was normal with no signs of elevation white blood cell  count, his C-reactive protein was normal, and the sed rate was slightly elevated but again I think this may just be due to ulceration really there is nothing significant to indicate severe infection. I think when he likely finishes his current round of oral antibiotics with Levaquin will likely take him off of the medication at that point based on the findings. I do feel like the hyperbarics helped in getting the infection under control as well as improving the overall size of the wound quite significantly. With that being said at this point I really feel like more more at the time frame that he may benefit from a muscle flap in order to try to help this wound heal effectively. I think that this would be the fastest way in order to do this. In the past he has been somewhat resistant to going down that path but nonetheless I feel like he likely should. 09/12/2019 on evaluation today patient appears to be doing fairly well in regard to his wound. He is tolerating the wound VAC without complication is still continues to drain significantly. Fortunately there is no signs of active infection at this time which is great news his lab work is also been doing very well. With that being said I do think at this point that the patient is still deriving benefit from the wound VAC especially in regard to controlling moisture which I think it is doing fairly well. He also has some improvement in the overall wound bed and on the medial aspect of the wound there are some hyper granular projections but nothing as severe as what they have been in the past. I am good have to perform some sharp debridement at this location today. 09/19/2019 upon evaluation today patient appears to be doing roughly the same although again the tissue does seem to be showing some signs of improvement which is good news. There is less hyper granular budding. Nonetheless he still is going require some sharp debridement today. We did get his  albumin and prealbumin results back and they are within normal limits in both cases which is excellent news that was the one thing [facility] plastic surgery was waiting on order to get him scheduled. He does seem to be a good candidate physically for the flap surgery if he decides to go that direction. With that being said is still left to be determined once they see the wound what they feel about how best to proceed. Obviously in the end it would be up to Mr. [name] as to whether he goes forward with the flap surgery or something else depending on the recommendations.  09/26/2019 upon evaluation today patient appears to be doing about the same in regard to his wound. Fortunately there is no signs of active infection there is also not as much hyper granulation noted today in fact I do not believe there is can be any sharp debridement required at this point. With that being said I do believe he continues to be somewhat stalled at this point with regard to his wound. There is no signs of active infection at this time which is good news. He does have an appointment with [facility] plastic surgery on Friday. 10/03/2019 upon evaluation today patient appears to be doing somewhat poorly especially in his overall demeanor compared to last week's evaluation. He unfortunately did see the physician at [facility] plastic surgery. Unfortunately the plan for the possibility of a flap would be a fairly significant surgery that the patient is really not wanting to take the risk of as if the surgery failed he would end up with a very large open wound at that point. Fortunately there is no signs of active infection at this time. No fevers, chills, nausea, vomiting, or diarrhea. With that being said there was some question that the physician had about why this would not be healing and the possibility that there may be some bone still needs to be removed deeper in the wound. Again the bone scan that we did previous actually did show evidence  of there still being a chronic osteomyelitis back on March 29, 2019. With that being said he did undergo treatment both with hyperbarics as well as oral antibiotics for quite some time since that point. Obviously he may need additional and updated imaging with the surgeon in order to see if there is anything else that can or will need to be done in order to help this area to heal. Obviously I still believe as well that there is something going on here. He is already seeing the surgeon that performed the original surgery after the bone scan but they did not feel that anything needed to be done from a surgical standpoint at that time. 10/10/2019 upon evaluation today patient appears to be really doing about the same with regard to his wound in the gluteal fold region. Unfortunately he is not making a lot of improvement the insurance also contacted home health and apparently the wound VAC is no longer to be covered due to lack of progress. Obviously that is something that we discussed as well as a strong possibility for happening soon and this was even last week that discussed this with him. With that being said again we have tried to manage this wound as best as I could up to this point. Fortunately there is no evidence of active infection at this time. No fevers, chills, nausea, vomiting, or diarrhea. [name], [name] ([id number]) 10/17/2019 upon evaluation today patient has decided to proceed with the flap surgery. He actually seems to be in better spirits today he states he wants to get this healed and after talking to the surgeon last week he feels like that is his best option for doing so. With that being said he also needs to have a referral to infectious disease to be seen for IV antibiotics preoperatively and postoperatively as far as the skin flap is concerned. He wonders if Dr. Steva Ready would be interested or available to do this for him. I explained that I would definitely have to check  with her and I will be happy to do that but I  am not certain What her answer will be in 2 I have a discussion with her. 10/24/2019 upon evaluation today patient appears to be doing about the same with regard to his wound although he is complaining of increased pain he is really not had any discomfort he states that it is getting much worse at this point. Fortunately there is no signs of active infection. At least not systemically. With that being said I question whether local infection could be a problem here secondary to the fact that to be honest the patient is having pain where he has not previous. 10/31/2019 upon evaluation today patient appears to be doing about the same in regard to his wound in the right gluteal fold region. Subsequently in the interim since I last [name] him last week I did receive a message from Dr. Steva Ready as well as reviewed her note. She was requesting the potential for a bone culture or at least a deep tissue culture in order to see about helping with more directed antibiotic therapy for the patient's projected flap surgery. If we could not obtain more specific organisms for treatment then obviously should be looking at more broad-spectrum antibiotic therapy. With that being said at this point the patient seems ready to move forward with the flap surgery. I did actually sign the paperwork for his bed which came through last week I believe that was on Thursday or Friday. Either way that should be coming to him as well so he will have that in place once the surgery occurs when he is discharged home. Fortunately there is no signs of active infection at this time. No fevers, chills, nausea, vomiting, or diarrhea. 11/15/2019 upon evaluation today patient appears to be doing about the same with regard to his right gluteal fold ulcer. He has been tolerating the dressing changes without complication right now he is awaiting the hospital bed with air mattress in order to be able to be  scheduled for his flap surgery. He is on IV vancomycin at this point is managed by Dr. Steva Ready. 11/30/2019; right gluteal fold extending over the ischial tuberosity. He had underlying osteomyelitis and there is still managing on vancomycin directed by infectious disease. His hospital bed and level 3 surface are arriving. After that there is plans for a myocutaneous flap at [facility]. They state that he will be in hospital for 2 weeks and for at least 2 weeks in a skilled facility may be longer 12/13/2019 upon evaluation today patient's wound appears to be doing about the same. Dr. Steva Ready actually did come for an evaluation of the patient here in office as well as she cannot really get him up on the bed in order to examine him in her office. Subsequently I did review his well his labs his C-reactive protein and sed rate have been doing excellent he actually appears to be maintaining quite nicely in my opinion. Was still having to pack the wound as it is quite deep and obviously he is going to proceed with the flap surgery but nonetheless I think that he is on the right track in that regard. He is currently still on the IV vancomycin although he does end on this on June 3. His surgery is scheduled for June 15. 12/22/2019 upon evaluation today patient appears to be doing okay with regard to his wound as of today. He is actually just a little over a week out from his surgery date. Subsequently his last day of antibiotic therapy is actually  today. Subsequently the PICC line will stay in place over the next week and a half per the surgeon's request. The patient will be proceeding with his flap surgery on the 15th of this month. Electronic Signature(s) Signed: 12/22/2019 1:38:59 PM By: Worthy Keeler [name] Entered By: Worthy Keeler on 12/22/2019 13:38:58 ADAMS, MENAKER ([id number]) -------------------------------------------------------------------------------- Physical Exam Details Patient  Name: [name] Date of Service: 12/22/2019 12:30 PM Medical Record Number: [id number] Patient Account Number: [id number] Date of Birth/Sex: 02-12-1951 (69 y.o. M) Treating RN: Army Melia Primary Care Provider: [name] Other Clinician: Referring Provider: [name] Treating Provider/Extender: [name], Leola Fiore Weeks in Treatment: 60 Constitutional Well-nourished and well-hydrated in no acute distress. Respiratory normal breathing without difficulty. Psychiatric this patient is able to make decisions and demonstrates good insight into disease process. Alert and Oriented x 3. pleasant and cooperative. Notes Upon inspection patient's wound bed again showed signs of some hyper granular buds noted throughout the wound bed. This is somewhat irregular but again is what we have been seeing all along. I see no evidence of infection or abnormal drainage at this point again the patient does have antibiotics through today although by the time I see him next week he will been off of those for a week. The main reason for that appointment is to make sure there is no signs of active infection or anything worsening that would indicate that he needs to have a culture prior to going into surgery. If everything looks okay then nothing will need to be done next week. Electronic Signature(s) Signed: 12/22/2019 1:39:41 PM By: Worthy Keeler [name] Entered By: Worthy Keeler on 12/22/2019 13:39:41 CHOZEN, RAPUANO ([id number]) -------------------------------------------------------------------------------- Physician Orders Details Patient Name: [name] Date of Service: 12/22/2019 12:30 PM Medical Record Number: [id number] Patient Account Number: [id number] Date of Birth/Sex: 03-21-1951 (69 y.o. 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 - in office o Cleanse wound with mild soap and water Anesthetic (add to Medication List) Wound #1 Right Gluteal fold o Topical Lidocaine 4% cream applied to wound bed prior to debridement (In Clinic Only). Skin Barriers/Peri-Wound Care o Skin Prep Primary Wound Dressing Wound #1 Right Gluteal fold o Silver Alginate Secondary Dressing Wound #1 Right Gluteal fold o Tegaderm o XtraSorb Dressing Change Frequency Wound #1 Right Gluteal fold o Change dressing every other day. Follow-up Appointments Wound #1 Right Gluteal fold o Return Appointment in 1 week. - Thursday Off-Loading Wound #1 Right Gluteal fold o Turn and reposition every 2 hours - no pressure on wounded area Home Health Wound #1 Right Gluteal fold o [place] Visits - Angus Nurse may visit PRN to address patientos wound care needs. o FACE TO FACE ENCOUNTER: MEDICARE and MEDICAID PATIENTS: I certify that this patient is under my care and that I had a face-to-face encounter that meets the physician face-to-face encounter requirements with this patient on this date. The encounter with the patient was in whole or in part for the following MEDICAL CONDITION: (primary  reason for Home SHAWNE, BRESLER ([id number]) Healthcare) MEDICAL NECESSITY: I certify, that based on my findings, NURSING services are a medically necessary home health service. HOME BOUND STATUS: I certify that my clinical findings support that this patient is  homebound (i.e., Due to illness or injury, pt requires aid of supportive devices such as crutches, cane, wheelchairs, walkers, the use of special transportation or the assistance of another person to leave their place of residence. There is a normal inability to leave the home and doing so requires considerable and taxing effort. Other absences are for medical reasons / religious services and are infrequent or of short duration when for other reasons). o If current dressing causes regression in wound condition, may D/C ordered dressing product/s and apply Normal Saline Moist Dressing daily until next [place] / Other MD appointment. [place] of regression in wound condition at [phone]. o Please direct any NON-WOUND related issues/requests for orders to patient's Primary Care Physician Electronic Signature(s) Signed: 12/22/2019 3:48:05 PM By: Army Melia Signed: 12/23/2019 6:04:00 PM By: Worthy Keeler [name] Entered By: Army Melia on 12/22/2019 13:06:14 TEMARION, HANLIN ([id number]) -------------------------------------------------------------------------------- Problem List Details Patient Name: [name] Date of Service: 12/22/2019 12:30 PM Medical Record Number: [id number] Patient Account Number: [id number] Date of Birth/Sex: 03-20-1951 (69 y.o. 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 He states that there is no history of diabetes that he is aware of. He does have issues with his prostate and is currently receiving treatment for this by way of oral medication. With that being said I do not have a lot of details in that regard. Nonetheless the patient presents today as a result of having been referred to [place] by another provider initially home health was set to come out and take care of his wound although due to the fact that he apparently drives he's not able to receive home health. His wife is therefore trying to help take care of this wound within although they have been struggling with what exactly to do at this point. She states that she can do some things but she is definitely not a nurse and does have some issues  with looking at blood. The good news is the wound does not appear to be too  deep and is fairly superficial at this point. There is no slough noted there is some nonviable skin noted around the surface of the wound and the perimeter at this point. The central portion of the wound appears to be very good with a dermal layer noted this does not appear to be again deep enough to extend it to subcutaneous tissue at this point. 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 In the pump he has Dilaudid and baclofen. 12/07/17 on evaluation today patient presents for follow-up concerning his ongoing lower back thigh ulcer on the right. He states that he did not get the supplies ordered and therefore has not really been able to perform the dressing changes as directed exactly. His wife was able to get some Boarder Foam Dressing's from the drugstore and subsequently has been using hydrogel which did help to a degree in the wound does appear to be able smaller. There is actually more drainage this week noted than previous. 12/21/17 on evaluation today patient appears to be doing rather well in regard to his right gluteal ulcer. He has been tolerating the dressing changes without complication. There does not appear to be any evidence of infection at this point in time. Overall the wound does seem to be making some progress as far as the edges are concerned there's not as much in the way of overlapping of the external wound edges and he has a good epithelium to wound bed border for the most part. This however is not true right at the 12 o'clock location over the span of a little over a centimeters which actually will require debridement today to clean this away and hopefully allow it to continue to heal more appropriately. 12/28/17 on evaluation today patient appears to be doing rather well in regard to his ulcer in the left gluteal region. He's been tolerating the dressing changes without  complication. Apparently he has had some difficulty getting his dressing material. Apparently there's been some confusion with ordering we're gonna check into this. Nonetheless overall he's been showing signs of improvement which is good news. Debridement is not required today. 01/04/18 on evaluation today patient presents for follow-up concerning his right gluteal ulcer. He has been tolerating the dressing changes fairly well. On inspection today it appears he may actually have some maceration them concerned about the fact that he may be developing too much moisture in and around the wound bed which can cause delay in healing. With that being said he unfortunately really has not showed significant signs of improvement since last week's evaluation in fact this may even be just the little bit/slightly larger. Nonetheless he's been having a lot of discomfort I'm not sure this is even related to the wound as he has no pain when I'm to breeding or otherwise cleaning the wound during evaluation today. Nonetheless this is something that we did recommend he talked to his pain specialist concerning. 01/11/18 on evaluation today patient appears to be doing better in regard to his ulceration. He has been tolerating the dressing changes without complication. With that being said overall there's no evidence of infection which is good news. The only thing is he did receive the hatch affair blue classic versus the ready nonetheless I feel like this is perfectly fine and appears to  have done well for him over the past week. 01/25/18 on evaluation today patient's wound actually appears to be a little bit larger than during the last evaluation. The good news is the majority of the wound edges actually appear to be fairly firmly attached to the wound bed unfortunately again we're not really making progress in regard to the size. Roughly the wound is about the same size as when I first [name] him although again the wound  margin/edges appear to be much better. 02/01/18 on evaluation today patient actually appears to be doing very well in regard to his wound. Applying the Prisma dry does seem to be better although he does still have issues with slow progression of the wound. There was a slight improvement compared to last week's measurements today. Nonetheless I have been considering other options as far as the possibility of Theraskin or even a snap vac. In general I'm not sure that the Theraskin due to location of the wound would be a very good idea. Nonetheless I do think that a snap vac could be a possibility for the patient and in fact I think this could even be an excellent way to manage the wound possibly seeing some improvement in a very rapid fashion here. Nonetheless this is something that we would need to get approved and I did have a lengthy conversation with the patient about this today. 02/08/18 on evaluation today patient appears to be doing a little better in regard to his ulcer. He has been tolerating the dressing changes without complication. Fortunately despite the fact that the wound is a little bit smaller it's not significantly so unfortunately. We have discussed the possibility of a snap vac we did check with insurance this is actually covered at this point. Fortunately there does not appear to be any sign of infection. Overall I'm fairly pleased with how things seem to be appearing at this point. 02/15/18 on evaluation today patient appears to be doing rather well in regard to his right gluteal ulcer. Unfortunately the snap vac did not stay in place with his sheer and friction this came loose and did not seem to maintain seal very well. He worked for about two days and it did seem to do very well during that time according to his wife but in general this does not seem to be something that's gonna be beneficial for him long-term. BRADLEE, LEBER ([id number]) do believe we need to go back to  standard dressings to see if we can find something that will be of benefit. 03/02/18- He is here in follow up evaluation; there is minimal change in the wound. He will continue with the same treatment plan, would consider changing to iodosrob/iodoflex if ulcer continues to to plateau. He will follow up next week 03/08/18 on evaluation today patient's wound actually appears to be about the same size as when I previously [name] him several weeks back. Unfortunately he does have some slightly dark discoloration in the central portion of the wound which has me concerned about pressure injury. I do believe he may be sitting for too long a period of time in fact he tells me that "I probably sit for much too long". He does have some Slough noted on the surface of the wound and again as far as the size of the wound is concerned I'm really not seeing anything that seems to have improved significantly. 03/15/18 on evaluation today patient appears to be doing fairly well in regard to his  ulcer. The wound measured pretty much about the same today compared to last week's evaluation when looking at his graph. With that being said the area of bruising/deep tissue injury that was noted last week I do not see at this point. He did get a new cushion fortunately this does seem to be have been of benefit in my pinion. It does appear that he's been off of this more which is good news as well I think that is definitely showing in the overall wound measurements. With that being said I do believe that he needs to continue to offload I don't think that the fact this is doing better should be or is going to allow him to not have to offload and explain this to him as well. Overall he seems to be in agreement the plan I think he understands. The overall appearance of the wound bed is improved compared to last week I think the Iodoflex has been beneficial in that regard. 03/29/18 on evaluation today patient actually appears to be doing  rather well in regard to his wound from the overall appearance standpoint he does have some granulation although there's some Slough on the surface of the wound noted as well. With that being said he unfortunately has not improved in regard to the overall measurement of the wound in volume or in size. I did have a discussion with him very specifically about offloading today. He actually does work although he mainly is just sitting throughout the day. He tells me he offloads by "lifting himself up for 30 seconds off of his chair occasionally" purchase from [employer] which does seem to have helped. And he has a new cushion that he with that being said he's also able to stand some for a very short period of time but not significant enough I think to provide appropriate offloading. I think the biggest issue at this point with the wound and the fact is not healing as quickly as we would like is due to the fact that he is really not able to appropriately offload while at work. He states the beginning after his injury he actually had a bed at his job that he could lay on in order to offload and that does seem to have been of help back at that time. Nonetheless he had not done this in quite some time unfortunately. I think that could be helpful for him this is something I would like for him to look into. 04/05/18 on evaluation today patient actually presents for follow-up concerning his right gluteal ulcer. Again he really is not significantly improved even compared to last week. He has been tolerating the dressing changes without complication. With that being said fortunately there appears to be no evidence of infection at this time. He has been more proactive in trying to offload. 04/12/18 on evaluation today patient actually appears to be doing a little better in regard to his wound and the right gluteal fold region. He's been tolerating the dressing changes since removing the oasis without  complication. However he was having a lot of burning initially with the oasis in place. He's unsure of exactly why this was given so much discomfort but he assumes that it was the oasis itself causing the problem. Nonetheless this had to be removed after about three days in place although even those three days seem to have made a fairly good improvement in regard to the overall appearance of the wound bed. In fact is the first  time that he's made any improvement from the standpoint of measurements in about six weeks. He continues to have no discomfort over the area of the wound itself which leads me to wonder why he was having the burning with the oasis when he does not even feel the actual debridement's themselves. I am somewhat perplexed by this. 04/19/18 on evaluation today patient's wound actually appears to be showing signs of epithelialization around the edge of the wound and in general actually appears to be doing better which is good news. He did have the same burning after about three days with applying the Endoform last week in the same fashion that I would generally apply a skin substitute. This seems to indicate that it's not the oasis to cause the problem but potentially the moisture buildup that just causes things to burn or there may be some other reaction with the skin prep or Steri-Strips. Nonetheless I'm not sure that is gonna be able to tolerate any skin substitute for a long period of time. The good news is the wound actually appears to be doing better today compared to last week and does seem to finally be making some progress. 04/26/18 on evaluation today patient actually appears to be doing rather well in regard to his ulcer in the right gluteal fold. He has been tolerating the dressing changes without complication which is good news. The Endoform does seem to be helping although he was a little bit more macerated this week. This seems to be an ongoing issue with fluid control at  this point. Nonetheless I think we may be able to add something like Drawtex to help control the drainage. 05/03/18 on evaluation today patient appears to actually be doing better in regard to the overall appearance of his wound. He has been tolerating the dressing changes without complication. Fortunately there appears to be no evidence of infection at this time. I really feel like his wound has shown signs as of today of turning around last week I thought so as well and definitely he could be seen in this week's overall appearance and measurements. In general I'm very pleased with the fact that he finally seems to be making a steady but sure progress. The patient likewise is very pleased. 05/17/18 on evaluation today patient appears to be doing more poorly unfortunately in regard to his ulcer. He has been tolerating the dressing changes without complication. With that being said he tells me that in the past couple of days he and his wife have noticed that we did not seem to be doing quite as well is getting dark near the center. Subsequently upon evaluation today the wound actually does appear to be doing worse compared to previous. He has been tolerating the dressing changes otherwise and he states that he is not been sitting up anymore than he was in the past from what he tells me. Still he has continued to work he states "I'm tired of dealing with this and if I have to just go home and lay in the bed all the time that's what I'll do". Nonetheless I am concerned about the fact that this wound does appear to be deeper than what it was previous. 05/24/18 upon evaluation today patient actually presents after having been in the hospital due to what was presumed to be sepsis secondary to the wound infection. He had an elevated white blood cell count between 14 and 15. With that being said he does seem to be doing somewhat better  now. His wound still is giving him some trouble nonetheless and he is  obviously concerned about the fact likely talked about that this does seem to go more deeply than previously noted. I did review his wound culture which showed evidence of Staphylococcus aureus him and group B strep. Nonetheless he is on antibiotics, Levaquin, for this. Subsequently I did review his intake summary from the hospital as well. I also did look at the CT of the lumbar spine with contrast that was performed which showed no bone destruction to suggest lumbar disguises/osteomyelitis or sacral osteomyelitis. There was no paraspinal abscess. Nonetheless it appears this may have been more of just a soft tissue infection at this point which is good news. He still is nonetheless concerned about the wound which again I think is completely reasonable considering everything he's been through recently. 05/31/18 on evaluation today on evaluation today patient actually appears to be showing signs of his wound be a little bit deeper than what I would like to see. Fortunately he does not show any signs of significant infection although his temperature was 99 today he states he's been [name], [name]. ([id number]) checking this at home and has not been elevated. Nonetheless with the undermining that I'm seeing at this point I am becoming more concerned about the wound I do think that offloading is a key factor here that is preventing the speedy recovery at this point. There does not appear to be any evidence of again over infection noted. He's been using Santyl currently. 06/07/18 the patient presents today for follow-up evaluation regarding the left ulcer in the gluteal region. He has been tolerating the Wound VAC fairly well. He is obviously very frustrated with this he states that to mean is really getting in his way. There does not appear to be any evidence of infection at this time he does have a little bit of odor I do not necessarily associate this with infection just something that we sometimes  notice with Wound VAC therapy. With that being said I can definitely catch a tone of discontentment overall in the patient's demeanor today. This when he was previously in the hospital an CT scan was done of the lumbar region which did not reveal any signs of osteomyelitis. With that being said the pelvis in particular was not evaluated distinctly which means he could still have some osteonecrosis I. Nonetheless the Wound VAC was started on Thursday I do want to get this little bit more time before jumping to a CT scan of the pelvis although that is something that I might would recommend if were not see an improvement by that time. 06/14/18 on evaluation today patient actually appears to be doing about the same in regard to his right gluteal ulcer. Again he did have a CT scan of the lumbar spine unfortunately this did not include the pelvis. Nonetheless with the depth of the wound that I'm seeing today even despite the fact that I'm not seeing any evidence of overt cellulitis I believe there's a good chance that we may be dealing with osteomyelitis somewhere in the right Ischial region. No fevers, chills, nausea, or vomiting noted at this time. 06/21/18 on evaluation today patient actually appears to be doing about the same with regard to his wound. The tunnel at 6 o'clock really does not appear to be any deeper although it is a little bit wider. I think at this point you may want to start packing this with white phone. Unfortunately I  have not got approval for the CT scan of the pelvis as of yet due to the fact that Medicare apparently has been denied it due to the diagnosis codes not being appropriate according to Medicare for the test requested. With that being said the patient cannot have an MRI and therefore this is the only option that we have as far as testing is concerned. The patient has had infection and was on antibiotics and been added code for cellulitis of the bottom to see if this will  be appropriate for getting the test approved. Nonetheless I'm concerned about the infection have been spread deeper into the Ischial region. 06/28/18 on evaluation today patient actually appears to be doing rather well all things considered in regard to the right gluteal ulcer. He has been tolerating the dressing changes without complication. With that being said the Wound VAC he states does have to be replaced almost every day or at least reinforced unfortunately. Patient actually has his CT scan later this morning we should have the results by tomorrow. 07/05/18 on evaluation today patient presents for follow-up concerning his right Ischial ulcer. He did see the surgeon Dr. Lysle Pearl last week. They were actually very happy with him and felt like he spent a tremendous amount of time with them as far as discussing his situation was concerned. In the end Dr. Lysle Pearl did contact me as well and determine that he would not recommend any surgical intervention at this point as he felt like it would not be in the patient's best interest based on what he was seeing. He recommended a referral to infectious disease. Subsequently this is something that Dr. Ines Bloomer office is working on setting up for the patient. As far as evaluation today is concerned the patient's wound actually appears to be worse at this point. I am concerned about how things are progressing and specifically about infection. I do not feel like it's the deeper but the area of depth is definitely widening which does have me concerned. No fevers, chills, nausea, or vomiting noted at this time. I think that we do need initiate antibiotic therapy the patient has an allow allergy to amoxicillin/penicillin he states that he gets a rash since childhood. Nonetheless she's never had the issues with Catholics or cephalosporins in general but he is aware of. 07/27/18 on evaluation today patient presents following admission to the hospital on 07/09/18. He was  subsequently discharged on 07/20/18. On 07/15/18 the patient underwent irrigation and debridement was soft tissue biopsy and bone biopsy as well as placement of a Wound VAC in the OR by Dr. Celine Ahr. During the hospital course the patient was placed on a Wound VAC and recommended follow up with surgery in three weeks actually with Dr. Delaine Lame who is infectious disease. The patient was on vancomycin during the hospital course. 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 He also had a bone culture which revealed evidence of methicillin-resistant staph aureus. He is updated CT scan 07/09/18 reveals that he had progression of the which was performed on wound to breakdown down to the trochanter where he actually had irregularities there as well suggestive of osteomyelitis. This was a change just since 9 December when we last performed a CT scan. Obviously this one had gone downhill quite significantly and rapidly. At this point upon evaluation I feel like in general the patient's wound seems to be doing fairly well all things considered upon my evaluation today. Obviously this is larger  and deeper than what I previously evaluated but at the same time he seems to be making some progress as far as the appearance of the granulation tissue is concerned. I'm happy in that regard. No fevers, chills, nausea, or vomiting noted at this time. He is on IV vancomycin and Rocephin at the facility. He is currently in [employer]. 08/03/18 upon evaluation today patient's wound appears to be doing better in regard to the overall appearance at this point in time. Fortunately he's been tolerating the Wound VAC without complication and states that the facility has been taking excellent care of the wound site. Overall I see some Slough noted on the surface which I am going to attempt sharp debridement today of but nonetheless other than this I feel like he's making progress. 08/09/18 on  evaluation today patient's wound appears to be doing much better compared to even last week's evaluation. Do believe that the Wound VAC is been of great benefit for him. He has been tolerating the dressing changes that is the Wound VAC without any complication and he has excellent granulation noted currently. There is no need for sharp debridement at this point. 08/16/18 on evaluation today patient actually appears to be doing very well in regard to the wound in the right gluteal fold region. This is showing signs of progress and again appears to be very healthy which is excellent news. Fortunately there is no sign of active infection by way of odor or drainage at this point. Overall I'm very pleased with how things stand. He seems to be tolerating the Wound VAC without complication. 08/23/18 on evaluation today patient actually appears to be doing better in regard to his wound. He has been tolerating the Wound VAC without complication and in fact it has been collecting a significant amount of drainage which I think is good news especially considering how the wound appears. Fortunately there is no signs of infection at this time definitely nothing appears to be worse which is good news. He has not been started on the Bactrim and Flagyl that was recommended by Dr. Delaine Lame yet. I did actually contact her office this morning in order to check and see were things are that regard their gonna be calling me back. 08/30/18 on evaluation today patient actually appears to show signs of excellent improvement today compared to last evaluation. The undermining is getting much better the wound seems to be feeling quite nicely and I'm very pleased that the granulation in general. With that being said overall I feel like the patient has made excellent progress which is great news. No fevers, chills, nausea, or vomiting noted at this time. 09/06/18 on evaluation today patient actually appears to be doing rather well in  regard to his right gluteal ulcer. This is showing signs of improvement in overall I'm very pleased with how things seem to be progressing. The patient likewise is please. Overall I see no evidence of infection he is about to complete his oral antibiotic regimen which is the end of the antibiotics for him in just about three days. [name], PIGNONE ([id number]) 09/13/18 on evaluation today patient's right Ischial ulcer appears to be showing signs of continued improvement which is excellent news. He's been tolerating the dressing changes without complication. Fortunately there's no signs of infection and the wound that seems to be doing very well. 09/28/18 on evaluation today patient appears to be doing rather well in regard to his right Ischial ulcer. He's been tolerating the Wound VAC  without complication he knows there's much less drainage than there used to be this obviously is not a bad thing in my pinion. There's no evidence of infection despite the fact is but nothing about it now for several weeks. 10/04/18 on evaluation today patient appears to be doing better in regard to his right Ischial wound. He has been tolerating the Wound VAC without complication and I do believe that the silver nitrate last week was beneficial for him. Fortunately overall there's no evidence of active infection at this time which is great news. No fevers, chills, nausea, or vomiting noted at this time. 10/11/18 on evaluation today patient actually appears to be doing rather well in regard to his Ischial ulcer. He's been tolerating the Wound VAC still without complication I feel like this is doing a good job. No fevers, chills, nausea, or vomiting noted at this time. 11/01/18 on evaluation today patient presents after having not been seen in our clinic for several weeks secondary to the fact that he was on evaluation today patient presents after having not been seen in our clinic for several weeks secondary to the fact  that he was in a skilled nursing facility which was on lockdown currently due to the covert 19 national emergency. Subsequently he was discharged from the facility on this past Friday and subsequently made an appointment to come in to see yesterday. Fortunately there's no signs of active infection at this time which is good news and overall he does seem to have made progress since I last [name]. Overall I feel like things are progressing quite nicely. The patient is having no pain. 11/08/18 on evaluation today patient appears to be doing okay in regard to his right gluteal ulcer. He has been utilizing a Wound VAC home health this changing this at this point since he's home from the skilled nursing facility. Fortunately there's no signs of obvious active infection at this time. Unfortunately though there's no obvious active infection he is having some maceration and his wife states that when the sheets of the Wound VAC office on Sunday when it broke seal that he ended up having significant issues with some smell as well there concerned about the possibility of infection. Fortunately there's No fevers, chills, nausea, or vomiting noted at this time. 11/15/18 on evaluation today patient actually appears to be doing well in regard to his right gluteal ulcer. He has been tolerating the dressing changes without complication. Specifically the Wound VAC has been utilized up to this point. Fortunately there's no signs of infection and overall I feel like he has made progress even since last week when I last [name] him. I'm actually fairly happy with the overall appearance although he does seem to have somewhat of a hyper granular overgrowth in the central portion of the wound which I think may require some sharp debridement to try flatness out possibly utilizing chemical cauterization following. 11/23/18 on evaluation today patient actually appears to be doing very well in regard to his sacral ulcer. He seems to be  showing signs of improvement with good granulation. With that being said he still has the small area of hyper granulation right in the central portion of the wound which I'm gonna likely utilize silver nitrate on today. Subsequently he also keeps having a leak at the 6 o'clock location which is unfortunate we may be able to help out with some suggestions to try to prevent this going forward. Fortunately there's no signs of active infection at this  time. 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 He's been tolerating the dressing changes without complication. Fortunately there's no signs of active infection at this time. I've been rather pleased with how things have progressed there still some evidence of pressure getting to the area with some redness right around the immediate wound opening. Nonetheless other than this I'm not seeing any significant complications or issues the wound is somewhat hyper granular. Upon discussing with the patient and his wife today I'm not sure that the wound is being packed to the base with the foam at this point. And if it's not been packed fully that may be part of the reason why is not seen as much improvement as far as the granulation from the base out. Again we do not want pack too tightly but we need some of the firm to get to the base of the wound. I discussed this with patient and his wife today. 12/06/18 on evaluation today patient appears to be doing well in regard to his right gluteal pressure ulcer. He's been tolerating the dressing changes without complication. Fortunately there's no signs of active infection. He still has some hyper granular tissue and I do think it would be appropriate to continue with the chemical cauterization as of today. 12/16/18 on evaluation today patient actually appears to be doing okay in regard to his right gluteal ulcer. He is been  tolerating the dressing changes without complication including the Wound VAC. Overall I feel like nothing seems to be worsening I do feel like that the hyper granulation buds in the central portion of the wound have improved to some degree with the silver nitrate. We will have to see how things continue to progress. 12/20/18 on evaluation today patient actually appears to be doing much worse in my pinion even compared to last week's evaluation. Unfortunately as opposed to showing any signs of improvement the areas of hyper granular tissue in the central portion of the wound seem to be getting worse. Subsequently the wound bed itself also seems to be getting deeper even compared to last week which is both unusual as well as concerning since prior he had been shown signs of improvement. Nonetheless I think that the issue could be that he's actually having some difficulty in issues with a deeper infection. There's no external signs of infection but nonetheless I am more worried about the internal, osteomyelitis, that could be restarting. He has not been on antibiotics for some time at this point. I think that it may be a good idea to go ahead and started back on an antibiotic therapy while we wait to see what the testing shows. 12/27/18 on evaluation today patient presents for follow-up concerning his left gluteal fold wound. Fortunately he appears to be doing well today. I did review the CT scan which was negative for any signs of osteomyelitis or acute abnormality this is excellent news. Overall I feel like the surface of the wound bed appears to be doing significantly better today compared to previously noted findings. There does not appear any signs of infection nor does he have any pain at this time. 01/03/19 on evaluation today patient actually appears to be doing quite well in regard to his ulcer. Post debridement last week he really did not have too much bleeding which is good news. Fortunately today  this seems to be doing some better but we still  has some of the hyper granular tissue noted in the base of the wound which is gonna require sharp debridement today as well. Overall I'm pleased with how things seem to be progressing since we switched away from the Wound VAC I think he is making some progress. 01/10/19 on evaluation today patient appears to be doing better in regard to his right gluteal fold ulcer. He has been tolerating the dressing changes without complication. The debridement to seem to be helping with current away some of the poor hyper granular tissue bugs throughout the region of his gluteal fold wound. He's been tolerating the dressing changes otherwise without complication which is great news. No fevers, chills, nausea, or vomiting noted at this time. 01/17/19 on evaluation today patient actually appears to be doing excellent in regard to his wound. He's been tolerating the dressing changes [name], [name]. ([id number]) without complication. Fortunately there is no signs of active infection at this time which is great news. No fevers, chills, nausea, or vomiting noted at this time. 01/24/19 on evaluation today patient actually appears to be doing quite well with regard to his ulcer. He has been tolerating the dressing changes without complication. Fortunately there's no signs of active infection at this time. Overall been very pleased with the progress that he seems to be making currently. 01/31/19-Patient returns at 1 week with apparent similarity in dimensions to the wound, with no signs of infection, he has been changing dressings twice a day 02/08/19 upon evaluation today patient actually appears to be doing well with regard to his right Ischial ulcer. The wound is not appear to be quite as deep and seems to be making progress which is good news. With that being said I'm still reluctant to go back to the Wound VAC at this point. He's been having to change the dressings twice  a day which is a little bit much in my pinion from the wound care supplies standpoint. I think that possibly attempting to utilize extras orbit may be beneficial this may also help to prevent any additional breakdown secondary to fluid retention in the wound itself. The patient is in agreement with giving this a try. 02/15/19 on evaluation today patient actually appears to be doing decently well with regard to his ulcer in the right to gluteal fold location. He's been tolerating the dressing changes without complication. Fortunately there is no signs of active infection at this time. He is able to keep the current dressing in place more effectively for a day at a time whereas before he was having a changes to to three times a day. The actions or has been helpful in this regard. Fortunately there's no signs of anything getting worse and I do feel like he showing signs of good improvement with regard to the wound bed status. 02/22/2019 patient appears to be doing very well today with regard to his ulcer in the gluteal fold. Fortunately there is no signs of active infection and he has been tolerating the dressing changes without any complication. Overall extremely pleased with how things seem to be progressing. He has much less of the hyper granular projections within the wound these have slowly been debrided away and he seems to be doing well. The wound bed is more uniform. 03/01/19 on evaluation today patient appears to be doing unfortunately about the same in regard to his gluteal ulcer. He's been tolerating the dressing changes without complication. Fortunately there's no signs of active infection at this time. With that being  said he continues to develop these hyper granular projections which I'm unsure of exactly what they are and why they are rising. Nonetheless I explained to the patient that I do believe it would be a good idea for [place] to stand a biopsy sample for pathology to see if that can shed  any light on what exactly may be going on here. Fortunately I do not see any obvious signs of infection. With that being said the patient has had a little bit more drainage this week apparently compared to last week. 03/08/2019 on evaluation today patient actually appears to look somewhat better with regard to the appearance of his wound bed at this time. This is good news. Overall I am very pleased with how things seem to have progressed just in the past week with a switch to the [facility] dressing. I think that has been beneficial for him. With that being said at this time the patient is concerned about his biopsy that I sent off last week unfortunately I do not have that report as of yet. Nonetheless we have called to obtain this and hopefully will hear back from the lab later this morning. 03/15/19 on evaluation today patient's wound actually appears to be doing okay today with regard to the overall appearance of the wound bed. He has been tolerating the dressing changes without complication he still has hyper granular tissue noted but fortunately that seems to be minimal at this point compared to some of what we've seen in the past. Nonetheless I do think that he is still having some issues currently with some of this type of granulation the biopsy and since all showed nothing more than just evidence of granulation tissue. Therefore there really is nothing different to initiate or do at this point. 03/24/19 on evaluation today patient appears to be doing a little better with regard to his ulcer. He's been tolerating the dressing changes without complication. Fortunately there is no signs of active infection at this time. No fevers, chills, nausea, or vomiting noted at this time. I'm overall pleased with how things seem to be progressing. 03/29/2019 on evaluation today patient appears to be doing about the same in regard to his ulcer in the right gluteal fold. Unfortunately he is not seeming to  make a lot of progress and the wound is somewhat stalled. There is no signs of active infection externally but I am concerned about the possibility of infection continuing in the ischial location which previously he did have infection noted. Again is not able to have an MRI so probably our best option for testing for this would be a triple phase bone scan which will detect subtle changes in the bone more so than plain film x-rays. In the past they really have not been beneficial and in fact the CT scans even have been somewhat questionable at times. Nonetheless there is no signs of systemic infection which is at least good news but again his wound is not healing at all on the predicted schedule. 04/05/19 on evaluation today patient appears to be doing well all things considering with regard to his wound and the right gluteal fold. He actually has his triple bone scan scheduled for sometime in the next couple of days. With that being said I've also been looking to other possibilities of what could be causing hyper granular tissue were looking into the bone scan again for evaluating for the risk or possibility of infection deeper and I'm also gonna go ahead  and see about obtaining a deep tissue culture today to send and see if there's any evidence of infection noted on culture. He's in agreement with that plan. 04/12/2019 on evaluation today patient presents for reevaluation here in our clinic concerning ongoing issues with his right gluteal fold ulcer. 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 It was discussed with him at that point that I think it would be appropriate for him to proceed with hyperbaric oxygen therapy especially in light of the fact that he is previously been on IV antibiotics at the beginning of the year for close to 3 months followed by several weeks of oral  antibiotics that was all prescribed by infectious disease. He had surgical debridement around Christmas December 2019 due to an abscess and osteomyelitis of the ischial bone. Unfortunately this has not really proceeded as well as we would have liked and again we did a CT scan even a couple months ago as he cannot have an MRI secondary to having issues with both a pain pump as well as a spinal cord stimulator which prevent him from going into an MRI machine. With that being said there were chronic changes noted at the ischial tuberosity which had progressed since December 2019 there was no evidence of fluid collection on that initial CT scan. With that being said that was on January 21, 2019. I am not sure that it was a sensitive enough test as compared to an MRI and then subsequently I ordered a bone scan for him which was actually completed on 03/29/2019 and this revealed that he does indeed have positive osteomyelitis involving the right ischial tuberosity. This is adjacent to the ulcer and I think is the reason that his ulcer is not healing. Subsequently I am in a place him back on oral antibiotics today unfortunately his wound culture showed just mixed gram-negative flora with no specific findings of a predominant organism. Nonetheless being with the location I think a good broad-spectrum antibiotic for gram-negative's is a good choice at this point he is previously taken Levaquin without significant issues and I think that is an appropriate antibiotic for him at this time. 04/19/2019 on evaluation today patient actually appears to be doing fairly well with regard to his wound. He has been tolerating the dressing changes without complication. Fortunately there is no signs of active infection and he has been taking the oral antibiotics at this time. EHAN, COSNER ([id number]) Subsequently we did make a referral to infectious disease although Dr. Steva Ready wants the patient to be seen by general  surgery first in order apparently to see if there is anything from a surgical standpoint that should be done prior to initiation of IV antibiotic therapy. Again the patient is okay with actually seeing Dr. Celine Ahr whom he has seen before we will make that referral. 04/26/2019 on evaluation today patient actually appears to be doing well with regard to his wound. He has been tolerating the dressing changes without complication. Fortunately there is no signs of active infection at this time. I do believe that the hyperbaric oxygen therapy along with the antibiotics which I prescribed at this point have been doing well for him. With that being said he has not seen Dr. Celine Ahr the surgeon yet in fact they have not been contacted for scheduling an appointment as of yet. Subsequently the patient is not on antibiotics currently  by IV Dr. Steva Ready did want him to see Dr. Celine Ahr first which we are working on trying to get scheduled. We again give the information to the patient today for Dr. Celine Ahr and her number as far as contacting their office to see about getting something scheduled. Again were looking for whether or not they recommend any surgical intervention. 05/03/2019 on evaluation today patient appears to be doing about the same with regard to his wound there may be a little bit of filling in in regard to the base of the wound but still he has quite a bit of hyper granular tissue that I am seeing today. I believe that he may may need a more aggressive sharp debridement possibly even by the surgeon or under anesthesia in order to clear away some of the hyper granular material in order to help allow for appropriate granulation to fill in. We have made a referral to Dr. Celine Ahr unfortunately it sounds as if they did not receive the referral we contacted them today they should be get in touch with the patient. Depending on how things show from that standpoint the patient may need to see Dr. Ardyth Gal who  is the infectious disease specialist although he really does not want a PICC line again. No fevers, chills, nausea, vomiting, or diarrhea. He is tolerating hyperbaric oxygen therapy very well. 05/12/2019 on evaluation today patient presents for follow-up visit concerning his right gluteal fold ulcer. He did see Dr. Celine Ahr the surgeon who previously evaluated his wound and she actually felt like he was doing quite well with regard to the wound based on what she was seen. He does seem to be responding to some degree with the oral antibiotics along with hyperbaric oxygen therapy at this point she did not see any evidence or need for further surgical intervention at this point. She recommended deferring additional or ongoing antibiotic therapy to Dr. Steva Ready at her discretion. Fortunately there is no signs of active systemic infection. No fevers, chills, nausea, vomiting, or diarrhea. 10/29; patient I am seeing really for the first time today in terms of his wound. 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 He is being treated with hyperbaric oxygen for underlying osteomyelitis most recently documented by a three-phase bone scan. He has been on Levaquin for roughly a month and he is out of these and asked me for a refill. Most recent cultures were negative although I do not think these were bone cultures. He has a very irregular surface to this wound which is almost nodular in texture. It undermines superiorly with the same nodular hyper granulated surface. I see that he was referred to general surgery for an operative debridement although the surgeon did not agree with this I think that would have been the proper course of action in this. He has been using Hydrofera Blue. He is supposed to start a wound VAC tomorrow which is actually a reapplication of wound VAC. I think mostly last time they had trouble keeping the seal in place I hope we have better look at this this  time. 05/24/2019 on evaluation today I did review patient's note from Dr. Dellia Nims where he was seen last week when I was out of the office. Subsequently it does appear that Dr. Dellia Nims was in agreement that the patient really needed debridement to clear away some of the nodular tissue in the base of the wound. The good news is he has the wound VAC initiated and some  of this tissue is already starting to breakdown under the treatment of the wound VAC again because it is not really viable. Nonetheless I do believe that we can likely perform some debridement today to clear this away and hopefully the continuation of the wound VAC along with hyperbarics and antibiotic therapy will be beneficial in stopping this from reoccurring. If we get better control in the wound bed in a good spot I think we can definitely use the PriMatrix which we actually did get approval for. 05/31/2019 on evaluation today patient appears to be doing actually pretty well at this point with regard to his wound. He has been tolerating the dressing changes which is good news. Fortunately there is no signs of infection. He still has some of the hyper granular nodules noted we have not really cleared all these away and I think we can try to do a little bit more that today. I did do quite a bit last week I am hoping to get down to the base of the wound so though actually be able to heal more appropriately. I think the wound VAC is doing a good job right now with controlling the drainage and overall the patient is happy with that. 06/07/2019 upon evaluation today patient appears to be doing quite well compared to last week with regard to the hyper granulation in the base of the wound. He has been tolerating the dressing changes without complication. Fortunately there is no signs of active infection at this time. With that being said we have not had any recent measures as far as blood work is concerned I think we may want to check a few  things including a CBC, sed rate, and C-reactive protein. Patient is in agreement with that we can try to get that scheduled for him for this Friday. 06/13/2019 on evaluation today patient actually appears to be doing much better at this point based on what I am seeing with regard to his wound. He has much less in the way of hyper granular tissue which is good news and a lot more healing that is taken place since I last [name] him as far as the amount of space within the wound itself. With that being said he is nearing the completion of the initial 40 treatments for hyperbaric oxygen therapy I think this is something I would recommend extending as he does seem to be benefiting at this time. Fortunately there is no evidence of active systemic infection at this point nor even local infection for that matter. 06/20/2019 upon evaluation today patient actually appears to be doing well with regard to his wound. In fact this appears to be doing much better today compared to what it was previous. I been extremely pleased with the progress that he is made. In fact the tunnel is much less deep today than what it was in the past and I am seeing excellent signs of improvement. Overall I am happy with how things are going. I did review his white blood cell count which revealed everything to be okay. The one abnormal finding on his CBC was a hemoglobin of 12.7. Subsequently I did also review his sed rate and C-reactive protein. His sed rate measured in at normal level measuring at 26. The C-reactive protein however was elevated today and an abnormal range indicating inflammation. Still I feel like he is trending in a good direction at this time. He is going to need a refill of the Levaquin he tells me at  this point. 07/04/2019 on evaluation today patient actually appears to be doing well in regard to his wound. He has been tolerating the dressing changes without complication. Fortunately there is no signs of active  infection which is good news. No fevers, chills, nausea, vomiting, or diarrhea. The good news also is that I did review his labs and though his sed rate was slightly elevated the C-reactive protein was actually down compared to the last evaluation 2 weeks ago this is excellent news I think it is showing signs of improvement. 07/11/2019 on evaluation today patient actually appears to be doing quite well with regard to his wound on my evaluation. This is very slow going but nonetheless he seems to be making progress. Especially in regard to the depth. Nonetheless I believe that we may want to consider going forward with the PriMatrix which was previously approved for him. We will get a have to get a new approval as we will actually be applying this in the new year so we will likely obtain that approval on January 4. In the meantime and for the time being my suggestion is going to be that we go ahead and continue with the wound VAC and we were going at this point with the current wound care measures. 12/28-Patient seen after HBO session today, wound depth with the tunnel measures 3.2 cm, the wound bed appears healthy, continuing with the VAC and HBO [name], [name] 07/25/2019 on evaluation today patient's wound appears to be doing about the same compared to my last evaluation. Fortunately there is no evidence of significant infection which is good news we will still waiting on the results of his lab work which unfortunately was not done prior to today's visit which is what we were aiming for. Ultimately there is no sign of active infection at this time which is good news. And he also tells me that the wound VAC has been staying in place and doing fairly well which is also good news. With that being said I am concerned about the fact that the wound has not progressed as much I think we may want to look into getting reapproval for the skin substitute, specifically PriMatrix, that I am  hopeful will help to allow this area to granulate in more effectively. I would recommend as well continuing with the wound VAC along with this. 08/01/2019 on evaluation today patient appears to be doing in my opinion slightly better in regard to the wound. He actually does have some tightening of the walls around the edges which is a good thing. Fortunately there is no signs of active infection at this time. No fever chills noted. I do believe that the patient in general seems to be making progress although is very slow. Obviously were trying to get things to speed up somewhat more. No fevers, chills, nausea, vomiting, or diarrhea. 08/08/2019 upon evaluation today patient appears to be doing well with regard to his wound although there is not a significant improvement overall we do have the PriMatrix for availability today to apply. Fortunately there is no signs of active infection and the patient overall seems to be doing well. There is really no need for aggressive sharp debridement at this point either which is also good news. I do believe her get a need to suture and the primary checks in the basin in the PriMatrix in order to keep it in place at the base of the wound underneath the wound VAC. This was discussed with  the patient today as well. He is in agreement the plan. He really does not have any filling in the area and so this should not be a complication as far as suturing is concerned. 08/15/2019 on evaluation today patient appears to be doing somewhat better in my opinion in regard to the overall appearance of his wound. He has been tolerating the dressing changes without complication. Fortunately the skin subappear to still be in place when this was changed on Friday. With that being said it appears that the PriMatrix may have dissolved in the interim between now and then as the sutures are no longer holding anything in place and again I would sutured it into place. Nonetheless I believe that  we can go ahead and likely consider going forward that we may need to apply the PriMatrix weekly as opposed every other week as it does seem to have dissolved rather readily and well. Overall the wound bed appears to be doing excellent at this time. 08/22/2019 upon evaluation today the patient does seem to making some progress with regard to his wound. Overall there is some better quality tissue and less of the hyper granular protrusions all of which is good news. With that being said we do have the PriMatrix available for application today and I am in a try to find a better way to suture this in place for him today. 08/29/2019 on evaluation today patient appears to be doing very well in regard to his wound. I do see signs of new granulation which seems to be somewhat helpful he did have some need for sharp debridement today to clear some of the hyper granulation away but nonetheless I feel like we are headed in a good direction. Fortunately there is no evidence of any systemic infection although we are going to go ahead and reorder the CBC, C-reactive protein, and sed rate today to see where things stand I did just refill his Levaquin as well. 09/05/2019 upon evaluation today patient's wound actually is not appearing to be showing signs of significant improvement compared to what we have seen previous from the standpoint of depth. He overall wound size wise seems to be about the same and again some of the quality of the wound bed is still doing fairly well. I did review his labs and his CBC was normal with no signs of elevation white blood cell count, his C-reactive protein was normal, and the sed rate was slightly elevated but again I think this may just be due to ulceration really there is nothing significant to indicate severe infection. I think when he likely finishes his current round of oral antibiotics with Levaquin will likely take him off of the medication at that point based on the findings. I  do feel like the hyperbarics helped in getting the infection under control as well as improving the overall size of the wound quite significantly. With that being said at this point I really feel like more more at the time frame that he may benefit from a muscle flap in order to try to help this wound heal effectively. I think that this would be the fastest way in order to do this. In the past he has been somewhat resistant to going down that path but nonetheless I feel like he likely should. 09/12/2019 on evaluation today patient appears to be doing fairly well in regard to his wound. He is tolerating the wound VAC without complication is still continues to drain significantly. Fortunately there is  no signs of active infection at this time which is great news his lab work is also been doing very well. With that being said I do think at this point that the patient is still deriving benefit from the wound VAC especially in regard to controlling moisture which I think it is doing fairly well. He also has some improvement in the overall wound bed and on the medial aspect of the wound there are some hyper granular projections but nothing as severe as what they have been in the past. I am good have to perform some sharp debridement at this location today. 09/19/2019 upon evaluation today patient appears to be doing roughly the same although again the tissue does seem to be showing some signs of improvement which is good news. There is less hyper granular budding. Nonetheless he still is going require some sharp debridement today. We did get his albumin and prealbumin results back and they are within normal limits in both cases which is excellent news that was the one thing [facility] plastic surgery was waiting on order to get him scheduled. He does seem to be a good candidate physically for the flap surgery if he decides to go that direction. With that being said is still left to be determined once they see the  wound what they feel about how best to proceed. Obviously in the end it would be up to Mr. Zumbach as to whether he goes forward with the flap surgery or something else depending on the recommendations. 09/26/2019 upon evaluation today patient appears to be doing about the same in regard to his wound. Fortunately there is no signs of active infection there is also not as much hyper granulation noted today in fact I do not believe there is can be any sharp debridement required at this point. With that being said I do believe he continues to be somewhat stalled at this point with regard to his wound. There is no signs of active infection at this time which is good news. He does have an appointment with [facility] plastic surgery on Friday. 10/03/2019 upon evaluation today patient appears to be doing somewhat poorly especially in his overall demeanor compared to last week's evaluation. He unfortunately did see the physician at [facility] plastic surgery. Unfortunately the plan for the possibility of a flap would be a fairly significant surgery that the patient is really not wanting to take the risk of as if the surgery failed he would end up with a very large open wound at that point. Fortunately there is no signs of active infection at this time. No fevers, chills, nausea, vomiting, or diarrhea. With that being said there was some question that the physician had about why this would not be healing and the possibility that there may be some bone still needs to be removed deeper in the wound. Again the bone scan that we did previous actually did show evidence of there still being a chronic osteomyelitis back on March 29, 2019. With that being said he did undergo treatment both with hyperbarics as well as oral antibiotics for quite some time since that point. Obviously he may need additional and updated imaging with the surgeon in order to see if there is anything else that can or will need to be done in order to help  this area to heal. Obviously I still believe as well that there is something going on here. He is already seeing the surgeon that performed the original surgery after the bone  scan but they did not feel that anything needed to be done from a surgical standpoint at that time. 10/10/2019 upon evaluation today patient appears to be really doing about the same with regard to his wound in the gluteal fold region. HOWARD, BRUER ([id number]) Unfortunately he is not making a lot of improvement the insurance also contacted home health and apparently the wound VAC is no longer to be covered due to lack of progress. Obviously that is something that we discussed as well as a strong possibility for happening soon and this was even last week that discussed this with him. With that being said again we have tried to manage this wound as best as I could up to this point. Fortunately there is no evidence of active infection at this time. No fevers, chills, nausea, vomiting, or diarrhea. 10/17/2019 upon evaluation today patient has decided to proceed with the flap surgery. He actually seems to be in better spirits today he states he wants to get this healed and after talking to the surgeon last week he feels like that is his best option for doing so. With that being said he also needs to have a referral to infectious disease to be seen for IV antibiotics preoperatively and postoperatively as far as the skin flap is concerned. He wonders if Dr. Steva Ready would be interested or available to do this for him. I explained that I would definitely have to check with her and I will be happy to do that but I am not certain What her answer will be in 2 I have a discussion with her. 10/24/2019 upon evaluation today patient appears to be doing about the same with regard to his wound although he is complaining of increased pain he is really not had any discomfort he states that it is getting much worse at this point. Fortunately  there is no signs of active infection. At least not systemically. With that being said I question whether local infection could be a problem here secondary to the fact that to be honest the patient is having pain where he has not previous. 10/31/2019 upon evaluation today patient appears to be doing about the same in regard to his wound in the right gluteal fold region. Subsequently in the interim since I last [name] him last week I did receive a message from Dr. Steva Ready as well as reviewed her note. She was requesting the potential for a bone culture or at least a deep tissue culture in order to see about helping with more directed antibiotic therapy for the patient's projected flap surgery. If we could not obtain more specific organisms for treatment then obviously should be looking at more broad-spectrum antibiotic therapy. With that being said at this point the patient seems ready to move forward with the flap surgery. I did actually sign the paperwork for his bed which came through last week I believe that was on Thursday or Friday. Either way that should be coming to him as well so he will have that in place once the surgery occurs when he is discharged home. Fortunately there is no signs of active infection at this time. No fevers, chills, nausea, vomiting, or diarrhea. 11/15/2019 upon evaluation today patient appears to be doing about the same with regard to his right gluteal fold ulcer. He has been tolerating the dressing changes without complication right now he is awaiting the hospital bed with air mattress in order to be able to be scheduled for his flap surgery. He  is on IV vancomycin at this point is managed by Dr. Steva Ready. 11/30/2019; right gluteal fold extending over the ischial tuberosity. He had underlying osteomyelitis and there is still managing on vancomycin directed by infectious disease. His hospital bed and level 3 surface are arriving. After that there is plans for a  myocutaneous flap at [facility]. They state that he will be in hospital for 2 weeks and for at least 2 weeks in a skilled facility may be longer 12/13/2019 upon evaluation today patient's wound appears to be doing about the same. Dr. Steva Ready actually did come for an evaluation of the patient here in office as well as she cannot really get him up on the bed in order to examine him in her office. Subsequently I did review his well his labs his C-reactive protein and sed rate have been doing excellent he actually appears to be maintaining quite nicely in my opinion. Was still having to pack the wound as it is quite deep and obviously he is going to proceed with the flap surgery but nonetheless I think that he is on the right track in that regard. He is currently still on the IV vancomycin although he does end on this on June 3. His surgery is scheduled for June 15. 12/22/2019 upon evaluation today patient appears to be doing okay with regard to his wound as of today. He is actually just a little over a week out from his surgery date. Subsequently his last day of antibiotic therapy is actually today. Subsequently the PICC line will stay in place over the next week and a half per the surgeon's request. The patient will be proceeding with his flap surgery on the 15th of this month. Objective Constitutional Well-nourished and well-hydrated in no acute distress. Vitals Time Taken: 12:45 PM, Height: 73 in, Weight: 210 lbs, BMI: 27.7, Temperature: 98.1 F, Pulse: 52 bpm, Respiratory Rate: 16 breaths/min, Blood Pressure: 112/75 mmHg. Respiratory normal breathing without difficulty. Psychiatric this patient is able to make decisions and demonstrates good insight into disease process. Alert and Oriented x 3. pleasant and cooperative. General Notes: Upon inspection patient's wound bed again showed signs of some hyper granular buds noted throughout the wound bed. This is somewhat irregular but again is what  we have been seeing all along. I see no evidence of infection or abnormal drainage at this point again the patient does have antibiotics through today although by the time I see him next week he will been off of those for a week. The main reason for that appointment is to make sure there is no signs of active infection or anything worsening that would indicate that he needs to have a culture prior to going into surgery. If everything looks okay then nothing will need to be done next week. Integumentary (Hair, Skin) Wound #1 status is Open. Original cause of wound was Pressure Injury. The wound is located on the Right Gluteal fold. The wound measures 3.6cm length x 1.2cm width x 2.5cm depth; 3.393cm^2 area and 8.482cm^3 volume. There is Fat Layer (Subcutaneous Tissue) Exposed exposed. There is no tunneling or undermining noted. There is a large amount of serous drainage noted. The wound margin is epibole. There is large (67-100%) pink, hyper - granulation within the wound bed. There is a small (1-33%) amount of necrotic tissue within the wound bed Craver, Soloman J. ([id number]) including [place]. 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 - in office Cleanse wound with mild soap and water Anesthetic (add to Medication List): Wound #1 Right Gluteal fold: Topical Lidocaine 4% cream applied to wound bed prior to debridement (In Clinic Only). Skin Barriers/Peri-Wound Care: Skin Prep Primary Wound Dressing: Wound #1 Right Gluteal fold: Silver Alginate Secondary Dressing: Wound #1 Right Gluteal fold: Tegaderm XtraSorb Dressing Change Frequency: Wound #1 Right Gluteal  fold: Change dressing every other day. Follow-up Appointments: Wound #1 Right Gluteal fold: Return Appointment in 1 week. - Thursday Off-Loading: Wound #1 Right Gluteal fold: Turn and reposition every 2 hours - no pressure on wounded area Home Health: Wound #1 Right Gluteal fold: Continue Home Health Visits - [place] Nurse may visit PRN to address patient s wound care needs. FACE TO FACE ENCOUNTER: MEDICARE and MEDICAID PATIENTS: I certify that this patient is under my care and that I had a face-to-face encounter that meets the physician face-to-face encounter requirements with this patient on this date. The encounter with the patient was in whole or in part for the following MEDICAL CONDITION: (primary reason for [place]) MEDICAL NECESSITY: I certify, that based on my findings, NURSING services are a medically necessary home health service. HOME BOUND STATUS: I certify that my clinical findings support that this patient is homebound (i.e., Due to illness or injury, pt requires aid of supportive devices such as crutches, cane, wheelchairs, walkers, the use of special transportation or the assistance of another person to leave their place of residence. There is a normal inability to leave the home and doing so requires considerable and taxing effort. Other absences are for medical reasons / religious services and are infrequent or of short duration when for other reasons). If current dressing causes regression in wound condition, may D/C ordered dressing product/s and apply Normal Saline Moist Dressing daily until next [place] / Other MD appointment. [place] of regression in wound condition at [phone]. Please direct any NON-WOUND related issues/requests for orders to patient's Primary Care Physician 1. I would recommend currently that we going to continue with the wound care measures as before. We have been utilizing the silver  alginate dressing which I think is still the best way to go. The patient is in agreement the plan. 2. I would recommend continued appropriate offloading which is also of benefit. 3. In preparation for the patient surgery he is actually had a air mattress in place at his home along with the IV antibiotics leading up to the procedure. The PICC line will be kept in place as well for the surgery. All in all everything seems to be in order for 15 June which is just a little over a week away. We will see patient back for reevaluation in 1 week here in the clinic. If anything worsens or changes patient will contact our office for additional recommendations. JAVID, IMPERATORE ([id number]) Electronic Signature(s) Signed: 12/22/2019 1:41:41 PM By: Worthy Keeler [name] Entered By: Worthy Keeler on 12/22/2019 13:41:40 YUTA, VANGINKEL ([id number]) -------------------------------------------------------------------------------- SuperBill Details Patient Name: [name] Date of Service: 12/22/2019 Medical Record Number: [id number] Patient Account Number: [id number] Date of Birth/Sex: 19-May-1951 (69 y.o. 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, sequela I10 Essential (primary) hypertension Facility Procedures CPT4 Code: [id number] Description: 99213 - WOUND CARE VISIT-LEV 3 EST PT Modifier: Quantity: 1 Physician Procedures CPT4 Code: [id number] Description: [id number] - WC PHYS LEVEL 3 - EST PT Modifier: Quantity: 1 CPT4 Code: Description: ICD-10 Diagnosis Description  L89.314 Pressure ulcer of right buttock, stage 4 M86.68 Other chronic osteomyelitis, other site L03.317 Cellulitis of buttock G82.20 Paraplegia, unspecified Modifier: Quantity: Electronic Signature(s) Signed: 12/22/2019 1:42:08 PM By: Worthy Keeler [name] Entered By: Worthy Keeler on 12/22/2019 13:42:08

## 2019-12-22 NOTE — Progress Notes (Signed)
KRISTAN, NILSSON ([id number]) Visit Report for 12/22/2019 Arrival Information Details Patient Name: ARAEL, KOLTERMAN Date of Service: 12/22/2019 12:30 PM Medical Record Number: [id number] Patient Account Number: [id number] Date of Birth/Sex: September 12, 1950 (69 y.o. M) Treating RN: Army Melia Primary Care Rickelle Sylvestre: [name] Other Clinician: Referring Lynzee Lindquist: [name] Treating Diany Formosa/Extender: [name], HOYT Weeks in Treatment: 107 Visit Information History Since Last Visit Added or deleted any medications: No Patient Arrived: Wheel Chair Any new allergies or adverse reactions: No Arrival Time: 12:45 Had a fall or experienced change in No Accompanied By: wife activities of daily living that may affect Transfer Assistance: None risk of falls: Patient Identification Verified: Yes Signs or symptoms of abuse/neglect since last visito No Secondary Verification Process Completed: Yes Hospitalized since last visit: No Patient Requires Transmission-Based Precautions: No Implantable device outside of the clinic excluding No Patient Has Alerts: Yes cellular tissue based products placed in the center Patient Alerts: NOT Diabetic since last visit: Has Dressing in Place as Prescribed: Yes Pain Present Now: Yes Electronic Signature(s) Signed: 12/22/2019 3:48:05 PM By: Army Melia Entered By: Army Melia on 12/22/2019 13:04:53 [name] ([id number]) -------------------------------------------------------------------------------- Clinic Level of Care Assessment Details Patient Name: [name] Date of Service: 12/22/2019 12:30 PM Medical Record Number: [id number] Patient Account Number: [id number] Date of Birth/Sex: 1950/10/15 (69 y.o. M) Treating RN: Army Melia Primary Care Rafal Archuleta: [name] Other Clinician: Referring Brean Carberry: [name] Treating Julien Oscar/Extender: [name], HOYT Weeks in Treatment: 107 Clinic Level of Care  Assessment Items TOOL 4 Quantity Score []  - Use when only an EandM is performed on FOLLOW-UP visit 0 ASSESSMENTS - Nursing Assessment / Reassessment X - Reassessment of Co-morbidities (includes updates in patient status) 1 10 X- 1 5 Reassessment of Adherence to Treatment Plan ASSESSMENTS - Wound and Skin Assessment / Reassessment X - Simple Wound Assessment / Reassessment - one wound 1 5 []  - 0 Complex Wound Assessment / Reassessment - multiple wounds []  - 0 Dermatologic / Skin Assessment (not related to wound area) ASSESSMENTS - Focused Assessment []  - Circumferential Edema Measurements - multi extremities 0 []  - 0 Nutritional Assessment / Counseling / Intervention []  - 0 Lower Extremity Assessment (monofilament, tuning fork, pulses) []  - 0 Peripheral Arterial Disease Assessment (using hand held doppler) ASSESSMENTS - Ostomy and/or Continence Assessment and Care []  - Incontinence Assessment and Management 0 []  - 0 Ostomy Care Assessment and Management (repouching, etc.) PROCESS - Coordination of Care X - Simple Patient / Family Education for ongoing care 1 15 []  - 0 Complex (extensive) Patient / Family Education for ongoing care []  - 0 Staff obtains Programmer, systems, Records, Test Results / Process Orders []  - 0 Staff telephones HHA, Nursing Homes / Clarify orders / etc []  - 0 Routine Transfer to another Facility (non-emergent condition) []  - 0 Routine Hospital Admission (non-emergent condition) []  - 0 New Admissions / Biomedical engineer / Ordering NPWT, Apligraf, etc. []  - 0 Emergency Hospital Admission (emergent condition) X- 1 10 Simple Discharge Coordination []  - 0 Complex (extensive) Discharge Coordination PROCESS - Special Needs []  - Pediatric / Minor Patient Management 0 []  - 0 Isolation Patient Management []  - 0 Hearing / Language / Visual special needs []  - 0 Assessment of Community assistance (transportation, D/C planning, etc.) []  - 0 Additional  assistance / Altered mentation []  - 0 Support Surface(s) Assessment (bed, cushion, seat, etc.) INTERVENTIONS - Wound Cleansing / Measurement Klumpp, Christobal J. ([id number]) X- 1 5 Simple Wound Cleansing - one wound []  -  0 Complex Wound Cleansing - multiple wounds X- 1 5 Wound Imaging (photographs - any number of wounds) []  - 0 Wound Tracing (instead of photographs) X- 1 5 Simple Wound Measurement - one wound []  - 0 Complex Wound Measurement - multiple wounds INTERVENTIONS - Wound Dressings []  - Small Wound Dressing one or multiple wounds 0 X- 1 15 Medium Wound Dressing one or multiple wounds []  - 0 Large Wound Dressing one or multiple wounds []  - 0 Application of Medications - topical []  - 0 Application of Medications - injection INTERVENTIONS - Miscellaneous []  - External ear exam 0 []  - 0 Specimen Collection (cultures, biopsies, blood, body fluids, etc.) []  - 0 Specimen(s) / Culture(s) sent or taken to Lab for analysis []  - 0 Patient Transfer (multiple staff / Civil Service fast streamer / Similar devices) []  - 0 Simple Staple / Suture removal (25 or less) []  - 0 Complex Staple / Suture removal (26 or more) []  - 0 Hypo / Hyperglycemic Management (close monitor of Blood Glucose) []  - 0 Ankle / Brachial Index (ABI) - do not check if billed separately X- 1 5 Vital Signs Has the patient been seen at the hospital within the last three years: Yes Total Score: 80 Level Of Care: New/Established - Level 3 Electronic Signature(s) Signed: 12/22/2019 3:48:05 PM By: Army Melia Entered By: Army Melia on 12/22/2019 13:06:49 [name] ([id number]) -------------------------------------------------------------------------------- Encounter Discharge Information Details Patient Name: [name] Date of Service: 12/22/2019 12:30 PM Medical Record Number: [id number] Patient Account Number: [id number] Date of Birth/Sex: 02-17-1951 (69 y.o. M) Treating RN: Army Melia Primary  Care Kysa Calais: [name] Other Clinician: Referring Aideen Fenster: [name] Treating Noga Fogg/Extender: [name], HOYT Weeks in Treatment: 107 Encounter Discharge Information Items Discharge Condition: Stable Ambulatory Status: Wheelchair Discharge Destination: Home Transportation: Private Auto Accompanied By: wife Schedule Follow-up Appointment: Yes Clinical Summary of Care: Electronic Signature(s) Signed: 12/22/2019 3:48:05 PM By: Army Melia Entered By: Army Melia on 12/22/2019 13:07:40 [name] ([id number]) -------------------------------------------------------------------------------- Lower Extremity Assessment Details Patient Name: [name] Date of Service: 12/22/2019 12:30 PM Medical Record Number: [id number] Patient Account Number: [id number] Date of Birth/Sex: 05-16-1951 (69 y.o. M) Treating RN: Montey Hora Primary Care Rosan Calbert: [name] Other Clinician: Referring Aribelle Mccosh: [name] Treating Lander Eslick/Extender: [name], HOYT Weeks in Treatment: 107 Electronic Signature(s) Signed: 12/22/2019 4:34:20 PM By: Montey Hora Entered By: Montey Hora on 12/22/2019 12:51:32 WAQAS, LARGENT ([id number]) -------------------------------------------------------------------------------- Multi Wound Chart Details Patient Name: [name] Date of Service: 12/22/2019 12:30 PM Medical Record Number: [id number] Patient Account Number: [id number] Date of Birth/Sex: 10-24-1950 (69 y.o. M) Treating RN: Army Melia Primary Care Roman Sandall: [name] Other Clinician: Referring Ethelbert Thain: [name] Treating Laprecious Austill/Extender: [name], HOYT Weeks in Treatment: 107 Vital Signs Height(in): 73 Pulse(bpm): 52 Weight(lbs): 210 Blood Pressure(mmHg): 112/75 Body Mass Index(BMI): 28 Temperature(F): 98.1 Respiratory Rate(breaths/min): 16 Photos: [N/A:N/A] Wound Location: Right Gluteal fold N/A N/A Wounding  Event: Pressure Injury N/A N/A Primary Etiology: Pressure Ulcer N/A N/A Comorbid History: Cataracts, Hypertension, Paraplegia N/A N/A Date Acquired: 11/02/2017 N/A N/A Weeks of Treatment: 107 N/A N/A Wound Status: Open N/A N/A Measurements L x W x D (cm) 3.6x1.2x2.5 N/A N/A Area (cm) : 3.393 N/A N/A Volume (cm) : 8.482 N/A N/A % Reduction in Area: 66.20% N/A N/A % Reduction in Volume: -744.00% N/A N/A Classification: Category/Stage IV N/A N/A Exudate Amount: Large N/A N/A Exudate Type: Serous N/A N/A Exudate Color: amber N/A N/A Wound Margin: Epibole N/A N/A Granulation Amount: Large (  67-100%) N/A N/A Granulation Quality: Pink, Hyper-granulation N/A N/A Necrotic Amount: Small (1-33%) N/A N/A Exposed Structures: Fat Layer (Subcutaneous Tissue) N/A N/A Exposed: Yes Fascia: No Tendon: No Muscle: No Joint: No Bone: No Epithelialization: Small (1-33%) N/A N/A Treatment Notes Electronic Signature(s) Signed: 12/22/2019 3:48:05 PM By: Army Melia Entered By: Army Melia on 12/22/2019 13:05:36 DAWYNE, DOTEN ([id number]) -------------------------------------------------------------------------------- [place] Details Patient Name: [name] Date of Service: 12/22/2019 12:30 PM Medical Record Number: [id number] Patient Account Number: [id number] Date of Birth/Sex: [birth year] (69 y.o. M) Treating RN: Army Melia Primary Care Tristan Bramble: [name] Other Clinician: Referring Helaina Stefano: [name] Treating Niccolas Loeper/Extender: [name], HOYT Weeks in Treatment: 107 Active Inactive Osteomyelitis Nursing Diagnoses: Infection: osteomyelitis Goals: Patient's osteomyelitis will resolve Date Initiated: 08/29/2019 Target Resolution Date: 09/28/2019 Goal Status: Active Signs and symptoms for osteomyelitis will be recognized and promptly addressed Date Initiated: 08/29/2019 Target Resolution Date: 09/28/2019 Goal Status:  Active Interventions: Assess for signs and symptoms of osteomyelitis resolution every visit Treatment Activities: Systemic antibiotics : 08/29/2019 Notes: Pressure Nursing Diagnoses: Knowledge deficit related to management of pressures ulcers Goals: Patient/caregiver will verbalize understanding of pressure ulcer management Date Initiated: 05/17/2018 Target Resolution Date: 05/28/2018 Goal Status: Active Interventions: Assess: immobility, friction, shearing, incontinence upon admission and as needed Notes: Wound/Skin Impairment Nursing Diagnoses: Impaired tissue integrity Goals: Patient/caregiver will verbalize understanding of skin care regimen Date Initiated: 11/30/2017 Target Resolution Date: 12/21/2017 Goal Status: Active Ulcer/skin breakdown will have a volume reduction of 30% by week 4 Date Initiated: 11/30/2017 Target Resolution Date: 12/21/2017 Goal Status: Active Interventions: Assess patient/caregiver ability to obtain necessary supplies Assess patient/caregiver ability to perform ulcer/skin care regimen upon admission and as needed Assess ulceration(s) every visit HEMAL, SPADA ([id number]) Treatment Activities: Skin care regimen initiated : 11/30/2017 Notes: Electronic Signature(s) Signed: 12/22/2019 3:48:05 PM By: Army Melia Entered By: Army Melia on 12/22/2019 13:05:08 MILIND, PITTSLEY ([id number]) -------------------------------------------------------------------------------- Pain Assessment Details Patient Name: [name] Date of Service: 12/22/2019 12:30 PM Medical Record Number: [id number] Patient Account Number: [id number] Date of Birth/Sex: 1950-09-04 (69 y.o. M) Treating RN: Montey Hora Primary Care Garrin Kirwan: [name] Other Clinician: Referring Kazimir Hartnett: [name] Treating Seanpatrick Maisano/Extender: [name], HOYT Weeks in Treatment: 107 Active Problems Location of Pain Severity and Description of Pain Patient Has Paino  Yes Site Locations Pain Location: Pain in Ulcers With Dressing Change: Yes Rate the pain. Current Pain Level: 6 Pain Management and Medication Current Pain Management: Electronic Signature(s) Signed: 12/22/2019 4:34:20 PM By: Montey Hora Entered By: Montey Hora on 12/22/2019 12:59:17 PHIL, KOZAR ([id number]) -------------------------------------------------------------------------------- Patient/Caregiver Education Details Patient Name: [name] Date of Service: 12/22/2019 12:30 PM Medical Record Number: [id number] Patient Account Number: [id number] Date of Birth/Gender: 01-Jun-1951 (69 y.o. M) Treating RN: Army Melia Primary Care Physician: [name] Other Clinician: Referring Physician: [name] Treating Physician/Extender: Sharalyn Ink in Treatment: 107 Education Assessment Education Provided To: Patient Education Topics Provided Wound/Skin Impairment: Handouts: Caring for Your Ulcer Methods: Demonstration, Explain/Verbal Responses: State content correctly Electronic Signature(s) Signed: 12/22/2019 3:48:05 PM By: Army Melia Entered By: Army Melia on 12/22/2019 13:07:11 DAMANTE, TREWIN ([id number]) -------------------------------------------------------------------------------- Wound Assessment Details Patient Name: [name] Date of Service: 12/22/2019 12:30 PM Medical Record Number: [id number] Patient Account Number: [id number] Date of Birth/Sex: [birth year] (69 y.o. M) Treating RN: Montey Hora Primary Care Tamasha Laplante: [name] Other Clinician: Referring Lolah Coghlan: [name] Treating Miller Limehouse/Extender: [name], HOYT Weeks in Treatment: 107 Wound Status Wound Number: 1 Primary Etiology: Pressure  Ulcer Wound Location: Right Gluteal fold Wound Status: Open Wounding Event: Pressure Injury Comorbid History: Cataracts, Hypertension, Paraplegia Date Acquired: 11/02/2017 Weeks Of Treatment:  107 Clustered Wound: No Photos Wound Measurements Length: (cm) 3.6 % R Width: (cm) 1.2 % R Depth: (cm) 2.5 Epi Area: (cm) 3.393 Tu Volume: (cm) 8.482 Un eduction in Area: 66.2% eduction in Volume: -744% thelialization: Small (1-33%) nneling: No dermining: No Wound Description Classification: Category/Stage IV Fou Wound Margin: Epibole Slo Exudate Amount: Large Exudate Type: Serous Exudate Color: amber l Odor After Cleansing: No ugh/Fibrino Yes Wound Bed Granulation Amount: Large (67-100%) Exposed Structure Granulation Quality: Pink, Hyper-granulation Fascia Exposed: No Necrotic Amount: Small (1-33%) Fat Layer (Subcutaneous Tissue) Exposed: Yes Necrotic Quality: Adherent Slough Tendon Exposed: No Muscle Exposed: No Joint Exposed: No Bone Exposed: No Treatment Notes Wound #1 (Right Gluteal fold) Notes S.cell, xsorb, tegaderm Electronic Signature(s) Signed: 12/22/2019 4:34:20 PM By: Oneita Kras, [name] ([id number]) Entered By: Montey Hora on 12/22/2019 12:58:19 CAROLINE, NORMILE ([id number]) -------------------------------------------------------------------------------- Vitals Details Patient Name: [name] Date of Service: 12/22/2019 12:30 PM Medical Record Number: [id number] Patient Account Number: [id number] Date of Birth/Sex: Mar 29, 1951 (69 y.o. M) Treating RN: Army Melia Primary Care Sawyer Mentzer: [name] Other Clinician: Referring Juda Lajeunesse: [name] Treating Kaylei Frink/Extender: [name], HOYT Weeks in Treatment: 107 Vital Signs Time Taken: 12:45 Temperature (F): 98.1 Height (in): 73 Pulse (bpm): 52 Weight (lbs): 210 Respiratory Rate (breaths/min): 16 Body Mass Index (BMI): 27.7 Blood Pressure (mmHg): 112/75 Reference Range: 80 - 120 mg / dl Electronic Signature(s) Signed: 12/22/2019 3:48:05 PM By: Army Melia Entered By: Army Melia on 12/22/2019 13:04:58

## 2019-12-29 ENCOUNTER — Other Ambulatory Visit: Payer: Self-pay

## 2019-12-29 ENCOUNTER — Encounter: Payer: Medicare Other | Admitting: Physician Assistant

## 2019-12-29 ENCOUNTER — Telehealth: Payer: Self-pay

## 2019-12-29 DIAGNOSIS — L89314 Pressure ulcer of right buttock, stage 4: Secondary | ICD-10-CM | POA: Diagnosis not present

## 2019-12-29 NOTE — Telephone Encounter (Signed)
Thanks

## 2019-12-29 NOTE — Telephone Encounter (Signed)
Called to check on patient. Abx are finished and he reports feeling great. PICC will remain intact for upcoming procedure and patient is flushing it. Advised labs are good also.

## 2019-12-29 NOTE — Progress Notes (Signed)
NICKLOUS, ABURTO ([id number]) Visit Report for 12/29/2019 Chief Complaint Document Details Patient Name: [name], [name] Date of Service: 12/29/2019 8:15 AM Medical Record Number: [id number] Patient Account Number: [id number] Date of Birth/Sex: 28-Dec-1950 (69 [name].o. M) Treating RN: Army Melia Primary Care Provider: [name] Other Clinician: Referring Provider: [name] Treating Provider/Extender: [name], Allahna Husband Weeks in Treatment: 108 Information Obtained from: Patient Chief Complaint Right gluteal fold ulcer Electronic Signature(s) Signed: 12/29/2019 8:44:08 AM By: Worthy Keeler [name] Entered By: Worthy Keeler on 12/29/2019 08:44:08 [name], [name] ([id number]) -------------------------------------------------------------------------------- HPI Details Patient Name: [name] Date of Service: 12/29/2019 8:15 AM Medical Record Number: [id number] Patient Account Number: [id number] Date of Birth/Sex: 09-06-1950 (69 [name].o. 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 He states that there is no history of diabetes that he is aware of. He does have issues with his prostate and is currently receiving treatment for this by way of oral medication. With that being said I do not have a lot of details in that regard. Nonetheless the patient presents today as a result of having been referred to [place] by another provider initially home health was set to come out and take care of his wound although due to the fact that  he apparently drives he's not able to receive home health. His wife is therefore trying to help take care of this wound within although they have been struggling with what exactly to do at this point. She states that she can do some things but she is definitely not a nurse and does have some issues with looking at blood. The good news is the wound does not appear to be too deep and is fairly superficial at this point. There is no slough noted there is some nonviable skin noted around the surface of the wound and the perimeter at this point. The central portion of the wound appears to be very good with a dermal layer noted this does not appear to be again deep enough to extend it to subcutaneous tissue at this point. 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 In the pump he has Dilaudid and baclofen. 12/07/17 on evaluation today patient presents for follow-up concerning his ongoing lower back thigh ulcer on the right. He states that he did not get the supplies ordered and therefore has not really been able to perform the dressing changes as directed exactly. His wife was able to get some Boarder Foam Dressing's from the drugstore and subsequently has been using hydrogel which did help to a degree in the wound does appear to be able smaller. There is actually more drainage this week noted than previous. 12/21/17 on evaluation today patient appears to be doing rather well in regard to his right gluteal ulcer. He has been tolerating the dressing changes without complication. There does not appear to be any evidence of infection at this point in time. Overall the wound does seem to be making some progress as far as the edges  are concerned there's not as much in the way of overlapping of the external wound edges and he has a good epithelium to wound bed border for the most part. This however is not true  right at the 12 o'clock location over the span of a little over a centimeters which actually will require debridement today to clean this away and hopefully allow it to continue to heal more appropriately. 12/28/17 on evaluation today patient appears to be doing rather well in regard to his ulcer in the left gluteal region. He's been tolerating the dressing changes without complication. Apparently he has had some difficulty getting his dressing material. Apparently there's been some confusion with ordering we're gonna check into this. Nonetheless overall he's been showing signs of improvement which is good news. Debridement is not required today. 01/04/18 on evaluation today patient presents for follow-up concerning his right gluteal ulcer. He has been tolerating the dressing changes fairly well. On inspection today it appears he may actually have some maceration them concerned about the fact that he may be developing too much moisture in and around the wound bed which can cause delay in healing. With that being said he unfortunately really has not showed significant signs of improvement since last week's evaluation in fact this may even be just the little bit/slightly larger. Nonetheless he's been having a lot of discomfort I'm not sure this is even related to the wound as he has no pain when I'm to breeding or otherwise cleaning the wound during evaluation today. Nonetheless this is something that we did recommend he talked to his pain specialist concerning. 01/11/18 on evaluation today patient appears to be doing better in regard to his ulceration. He has been tolerating the dressing changes without complication. With that being said overall there's no evidence of infection which is good news. The only thing is he did receive the hatch affair blue classic versus the ready nonetheless I feel like this is perfectly fine and appears to have done well for him over the past week. 01/25/18 on evaluation today  patient's wound actually appears to be a little bit larger than during the last evaluation. The good news is the majority of the wound edges actually appear to be fairly firmly attached to the wound bed unfortunately again we're not really making progress in regard to the size. Roughly the wound is about the same size as when I first saw him although again the wound margin/edges appear to be much better. 02/01/18 on evaluation today patient actually appears to be doing very well in regard to his wound. Applying the Prisma dry does seem to be better although he does still have issues with slow progression of the wound. There was a slight improvement compared to last week's measurements today. Nonetheless I have been considering other options as far as the possibility of Theraskin or even a snap vac. In general I'm not sure that the Theraskin due to location of the wound would be a very good idea. Nonetheless I do think that a snap vac could be a possibility for the patient and in fact I think this could even be an excellent way to manage the wound possibly seeing some improvement in a very rapid fashion here. Nonetheless this is something that we would need to get approved and I did have a lengthy conversation with the patient about this today. 02/08/18 on evaluation today patient appears to be doing a little better in regard to his ulcer. He has been tolerating  the dressing changes without complication. Fortunately despite the fact that the wound is a little bit smaller it's not significantly so unfortunately. We have discussed the possibility of a snap vac we did check with insurance this is actually covered at this point. Fortunately there does not appear to be any sign of infection. Overall I'm fairly pleased with how things seem to be appearing at this point. 02/15/18 on evaluation today patient appears to be doing rather well in regard to his right gluteal ulcer. Unfortunately the snap vac did not  stay in place with his sheer and friction this came loose and did not seem to maintain seal very well. He worked for about two days and it did seem to do very well during that time according to his wife but in general this does not seem to be something that's gonna be beneficial for him long-term. I do believe we need to go back to standard dressings to see if we can find something that will be of benefit. 03/02/18- He is here in follow up evaluation; there is minimal change in the wound. He will continue with the same treatment plan, would consider changing to iodosrob/iodoflex if ulcer continues to to plateau. He will follow up next week [name], [name] ([id number]) 03/08/18 on evaluation today patient's wound actually appears to be about the same size as when I previously saw him several weeks back. Unfortunately he does have some slightly dark discoloration in the central portion of the wound which has me concerned about pressure injury. I do believe he may be sitting for too long a period of time in fact he tells me that "I probably sit for much too long". He does have some Slough noted on the surface of the wound and again as far as the size of the wound is concerned I'm really not seeing anything that seems to have improved significantly. 03/15/18 on evaluation today patient appears to be doing fairly well in regard to his ulcer. The wound measured pretty much about the same today compared to last week's evaluation when looking at his graph. With that being said the area of bruising/deep tissue injury that was noted last week I do not see at this point. He did get a new cushion fortunately this does seem to be have been of benefit in my pinion. It does appear that he's been off of this more which is good news as well I think that is definitely showing in the overall wound measurements. With that being said I do believe that he needs to continue to offload I don't think that the fact this is  doing better should be or is going to allow him to not have to offload and explain this to him as well. Overall he seems to be in agreement the plan I think he understands. The overall appearance of the wound bed is improved compared to last week I think the Iodoflex has been beneficial in that regard. 03/29/18 on evaluation today patient actually appears to be doing rather well in regard to his wound from the overall appearance standpoint he does have some granulation although there's some Slough on the surface of the wound noted as well. With that being said he unfortunately has not improved in regard to the overall measurement of the wound in volume or in size. I did have a discussion with him very specifically about offloading today. He actually does work although he mainly is just sitting throughout the day. He tells me  he offloads by "lifting himself up for 30 seconds off of his chair occasionally" purchase from [employer] which does seem to have helped. And he has a new cushion that he with that being said he's also able to stand some for a very short period of time but not significant enough I think to provide appropriate offloading. I think the biggest issue at this point with the wound and the fact is not healing as quickly as we would like is due to the fact that he is really not able to appropriately offload while at work. He states the beginning after his injury he actually had a bed at his job that he could lay on in order to offload and that does seem to have been of help back at that time. Nonetheless he had not done this in quite some time unfortunately. I think that could be helpful for him this is something I would like for him to look into. 04/05/18 on evaluation today patient actually presents for follow-up concerning his right gluteal ulcer. Again he really is not significantly improved even compared to last week. He has been tolerating the dressing changes without  complication. With that being said fortunately there appears to be no evidence of infection at this time. He has been more proactive in trying to offload. 04/12/18 on evaluation today patient actually appears to be doing a little better in regard to his wound and the right gluteal fold region. He's been tolerating the dressing changes since removing the oasis without complication. However he was having a lot of burning initially with the oasis in place. He's unsure of exactly why this was given so much discomfort but he assumes that it was the oasis itself causing the problem. Nonetheless this had to be removed after about three days in place although even those three days seem to have made a fairly good improvement in regard to the overall appearance of the wound bed. In fact is the first time that he's made any improvement from the standpoint of measurements in about six weeks. He continues to have no discomfort over the area of the wound itself which leads me to wonder why he was having the burning with the oasis when he does not even feel the actual debridement's themselves. I am somewhat perplexed by this. 04/19/18 on evaluation today patient's wound actually appears to be showing signs of epithelialization around the edge of the wound and in general actually appears to be doing better which is good news. He did have the same burning after about three days with applying the Endoform last week in the same fashion that I would generally apply a skin substitute. This seems to indicate that it's not the oasis to cause the problem but potentially the moisture buildup that just causes things to burn or there may be some other reaction with the skin prep or Steri-Strips. Nonetheless I'm not sure that is gonna be able to tolerate any skin substitute for a long period of time. The good news is the wound actually appears to be doing better today compared to last week and does seem to finally be making some  progress. 04/26/18 on evaluation today patient actually appears to be doing rather well in regard to his ulcer in the right gluteal fold. He has been tolerating the dressing changes without complication which is good news. The Endoform does seem to be helping although he was a little bit more macerated this week. This seems to be an  ongoing issue with fluid control at this point. Nonetheless I think we may be able to add something like Drawtex to help control the drainage. 05/03/18 on evaluation today patient appears to actually be doing better in regard to the overall appearance of his wound. He has been tolerating the dressing changes without complication. Fortunately there appears to be no evidence of infection at this time. I really feel like his wound has shown signs as of today of turning around last week I thought so as well and definitely he could be seen in this week's overall appearance and measurements. In general I'm very pleased with the fact that he finally seems to be making a steady but sure progress. The patient likewise is very pleased. 05/17/18 on evaluation today patient appears to be doing more poorly unfortunately in regard to his ulcer. He has been tolerating the dressing changes without complication. With that being said he tells me that in the past couple of days he and his wife have noticed that we did not seem to be doing quite as well is getting dark near the center. Subsequently upon evaluation today the wound actually does appear to be doing worse compared to previous. He has been tolerating the dressing changes otherwise and he states that he is not been sitting up anymore than he was in the past from what he tells me. Still he has continued to work he states "I'm tired of dealing with this and if I have to just go home and lay in the bed all the time that's what I'll do". Nonetheless I am concerned about the fact that this wound does appear to be deeper than what it  was previous. 05/24/18 upon evaluation today patient actually presents after having been in the hospital due to what was presumed to be sepsis secondary to the wound infection. He had an elevated white blood cell count between 14 and 15. With that being said he does seem to be doing somewhat better now. His wound still is giving him some trouble nonetheless and he is obviously concerned about the fact likely talked about that this does seem to go more deeply than previously noted. I did review his wound culture which showed evidence of Staphylococcus aureus him and group B strep. Nonetheless he is on antibiotics, Levaquin, for this. Subsequently I did review his intake summary from the hospital as well. I also did look at the CT of the lumbar spine with contrast that was performed which showed no bone destruction to suggest lumbar disguises/osteomyelitis or sacral osteomyelitis. There was no paraspinal abscess. Nonetheless it appears this may have been more of just a soft tissue infection at this point which is good news. He still is nonetheless concerned about the wound which again I think is completely reasonable considering everything he's been through recently. 05/31/18 on evaluation today on evaluation today patient actually appears to be showing signs of his wound be a little bit deeper than what I would like to see. Fortunately he does not show any signs of significant infection although his temperature was 99 today he states he's been checking this at home and has not been elevated. Nonetheless with the undermining that I'm seeing at this point I am becoming more concerned about the wound I do think that offloading is a key factor here that is preventing the speedy recovery at this point. There does not appear to be any evidence of again over infection noted. He's been using Santyl currently. 06/07/18 the  patient presents today for follow-up evaluation regarding the left ulcer in the gluteal  region. He has been tolerating the Wound VAC [name], [name] ([id number]) fairly well. He is obviously very frustrated with this he states that to mean is really getting in his way. There does not appear to be any evidence of infection at this time he does have a little bit of odor I do not necessarily associate this with infection just something that we sometimes notice with Wound VAC therapy. With that being said I can definitely catch a tone of discontentment overall in the patient's demeanor today. This when he was previously in the hospital an CT scan was done of the lumbar region which did not reveal any signs of osteomyelitis. With that being said the pelvis in particular was not evaluated distinctly which means he could still have some osteonecrosis I. Nonetheless the Wound VAC was started on Thursday I do want to get this little bit more time before jumping to a CT scan of the pelvis although that is something that I might would recommend if were not see an improvement by that time. 06/14/18 on evaluation today patient actually appears to be doing about the same in regard to his right gluteal ulcer. Again he did have a CT scan of the lumbar spine unfortunately this did not include the pelvis. Nonetheless with the depth of the wound that I'm seeing today even despite the fact that I'm not seeing any evidence of overt cellulitis I believe there's a good chance that we may be dealing with osteomyelitis somewhere in the right Ischial region. No fevers, chills, nausea, or vomiting noted at this time. 06/21/18 on evaluation today patient actually appears to be doing about the same with regard to his wound. The tunnel at 6 o'clock really does not appear to be any deeper although it is a little bit wider. I think at this point you may want to start packing this with white phone. Unfortunately I have not got approval for the CT scan of the pelvis as of yet due to the fact that Medicare apparently  has been denied it due to the diagnosis codes not being appropriate according to Medicare for the test requested. With that being said the patient cannot have an MRI and therefore this is the only option that we have as far as testing is concerned. The patient has had infection and was on antibiotics and been added code for cellulitis of the bottom to see if this will be appropriate for getting the test approved. Nonetheless I'm concerned about the infection have been spread deeper into the Ischial region. 06/28/18 on evaluation today patient actually appears to be doing rather well all things considered in regard to the right gluteal ulcer. He has been tolerating the dressing changes without complication. With that being said the Wound VAC he states does have to be replaced almost every day or at least reinforced unfortunately. Patient actually has his CT scan later this morning we should have the results by tomorrow. 07/05/18 on evaluation today patient presents for follow-up concerning his right Ischial ulcer. He did see the surgeon Dr. Lysle Pearl last week. They were actually very happy with him and felt like he spent a tremendous amount of time with them as far as discussing his situation was concerned. In the end Dr. Lysle Pearl did contact me as well and determine that he would not recommend any surgical intervention at this point as he felt like it would not be  in the patient's best interest based on what he was seeing. He recommended a referral to infectious disease. Subsequently this is something that Dr. Ines Bloomer office is working on setting up for the patient. As far as evaluation today is concerned the patient's wound actually appears to be worse at this point. I am concerned about how things are progressing and specifically about infection. I do not feel like it's the deeper but the area of depth is definitely widening which does have me concerned. No fevers, chills, nausea, or vomiting noted at this  time. I think that we do need initiate antibiotic therapy the patient has an allow allergy to amoxicillin/penicillin he states that he gets a rash since childhood. Nonetheless she's never had the issues with Catholics or cephalosporins in general but he is aware of. 07/27/18 on evaluation today patient presents following admission to the hospital on 07/09/18. He was subsequently discharged on 07/20/18. On 07/15/18 the patient underwent irrigation and debridement was soft tissue biopsy and bone biopsy as well as placement of a Wound VAC in the OR by Dr. Celine Ahr. During the hospital course the patient was placed on a Wound VAC and recommended follow up with surgery in three weeks actually with Dr. Delaine Lame who is infectious disease. The patient was on vancomycin during the hospital course. 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 He also had a bone culture which revealed evidence of methicillin-resistant staph aureus. He is updated CT scan 07/09/18 reveals that he had progression of the which was performed on wound to breakdown down to the trochanter where he actually had irregularities there as well suggestive of osteomyelitis. This was a change just since 9 December when we last performed a CT scan. Obviously this one had gone downhill quite significantly and rapidly. At this point upon evaluation I feel like in general the patient's wound seems to be doing fairly well all things considered upon my evaluation today. Obviously this is larger and deeper than what I previously evaluated but at the same time he seems to be making some progress as far as the appearance of the granulation tissue is concerned. I'm happy in that regard. No fevers, chills, nausea, or vomiting noted at this time. He is on IV vancomycin and Rocephin at the facility. He is currently in [employer]. 08/03/18 upon evaluation today patient's wound appears to be doing better in regard to the  overall appearance at this point in time. Fortunately he's been tolerating the Wound VAC without complication and states that the facility has been taking excellent care of the wound site. Overall I see some Slough noted on the surface which I am going to attempt sharp debridement today of but nonetheless other than this I feel like he's making progress. 08/09/18 on evaluation today patient's wound appears to be doing much better compared to even last week's evaluation. Do believe that the Wound VAC is been of great benefit for him. He has been tolerating the dressing changes that is the Wound VAC without any complication and he has excellent granulation noted currently. There is no need for sharp debridement at this point. 08/16/18 on evaluation today patient actually appears to be doing very well in regard to the wound in the right gluteal fold region. This is showing signs of progress and again appears to be very healthy which is excellent news. Fortunately there is no sign of active infection by way of odor or drainage at this point. Overall I'm very  pleased with how things stand. He seems to be tolerating the Wound VAC without complication. 08/23/18 on evaluation today patient actually appears to be doing better in regard to his wound. He has been tolerating the Wound VAC without complication and in fact it has been collecting a significant amount of drainage which I think is good news especially considering how the wound appears. Fortunately there is no signs of infection at this time definitely nothing appears to be worse which is good news. He has not been started on the Bactrim and Flagyl that was recommended by Dr. Delaine Lame yet. I did actually contact her office this morning in order to check and see were things are that regard their gonna be calling me back. 08/30/18 on evaluation today patient actually appears to show signs of excellent improvement today compared to last evaluation. The  undermining is getting much better the wound seems to be feeling quite nicely and I'm very pleased that the granulation in general. With that being said overall I feel like the patient has made excellent progress which is great news. No fevers, chills, nausea, or vomiting noted at this time. 09/06/18 on evaluation today patient actually appears to be doing rather well in regard to his right gluteal ulcer. This is showing signs of improvement in overall I'm very pleased with how things seem to be progressing. The patient likewise is please. Overall I see no evidence of infection he is about to complete his oral antibiotic regimen which is the end of the antibiotics for him in just about three days. 09/13/18 on evaluation today patient's right Ischial ulcer appears to be showing signs of continued improvement which is excellent news. He's been tolerating the dressing changes without complication. Fortunately there's no signs of infection and the wound that seems to be doing very well. 09/28/18 on evaluation today patient appears to be doing rather well in regard to his right Ischial ulcer. He's been tolerating the Wound VAC Peets, Hubbard J. ([id number]) without complication he knows there's much less drainage than there used to be this obviously is not a bad thing in my pinion. There's no evidence of infection despite the fact is but nothing about it now for several weeks. 10/04/18 on evaluation today patient appears to be doing better in regard to his right Ischial wound. He has been tolerating the Wound VAC without complication and I do believe that the silver nitrate last week was beneficial for him. Fortunately overall there's no evidence of active infection at this time which is great news. No fevers, chills, nausea, or vomiting noted at this time. 10/11/18 on evaluation today patient actually appears to be doing rather well in regard to his Ischial ulcer. He's been tolerating the Wound VAC  still without complication I feel like this is doing a good job. No fevers, chills, nausea, or vomiting noted at this time. 11/01/18 on evaluation today patient presents after having not been seen in our clinic for several weeks secondary to the fact that he was on evaluation today patient presents after having not been seen in our clinic for several weeks secondary to the fact that he was in a skilled nursing facility which was on lockdown currently due to the covert 19 national emergency. Subsequently he was discharged from the facility on this past Friday and subsequently made an appointment to come in to see yesterday. Fortunately there's no signs of active infection at this time which is good news and overall he does seem to have made  progress since I last saw. Overall I feel like things are progressing quite nicely. The patient is having no pain. 11/08/18 on evaluation today patient appears to be doing okay in regard to his right gluteal ulcer. He has been utilizing a Wound VAC home health this changing this at this point since he's home from the skilled nursing facility. Fortunately there's no signs of obvious active infection at this time. Unfortunately though there's no obvious active infection he is having some maceration and his wife states that when the sheets of the Wound VAC office on Sunday when it broke seal that he ended up having significant issues with some smell as well there concerned about the possibility of infection. Fortunately there's No fevers, chills, nausea, or vomiting noted at this time. 11/15/18 on evaluation today patient actually appears to be doing well in regard to his right gluteal ulcer. He has been tolerating the dressing changes without complication. Specifically the Wound VAC has been utilized up to this point. Fortunately there's no signs of infection and overall I feel like he has made progress even since last week when I last saw him. I'm actually fairly happy  with the overall appearance although he does seem to have somewhat of a hyper granular overgrowth in the central portion of the wound which I think may require some sharp debridement to try flatness out possibly utilizing chemical cauterization following. 11/23/18 on evaluation today patient actually appears to be doing very well in regard to his sacral ulcer. He seems to be showing signs of improvement with good granulation. With that being said he still has the small area of hyper granulation right in the central portion of the wound which I'm gonna likely utilize silver nitrate on today. Subsequently he also keeps having a leak at the 6 o'clock location which is unfortunate we may be able to help out with some suggestions to try to prevent this going forward. Fortunately there's no signs of active infection at this time. 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 He's been tolerating the dressing changes without complication. Fortunately there's no signs of active infection at this time. I've been rather pleased with how things have progressed there still some evidence of pressure getting to the area with some redness right around the immediate wound opening. Nonetheless other than this I'm not seeing any significant complications or issues the wound is somewhat hyper granular. Upon discussing with the patient and his wife today I'm not sure that the wound is being packed to the base with the foam at this point. And if it's not been packed fully that may be part of the reason why is not seen as much improvement as far as the granulation from the base out. Again we do not want pack too tightly but we need some of the firm to get to the base of the wound. I discussed this with patient and his wife today. 12/06/18 on evaluation today patient appears to be doing well in regard to his right gluteal pressure  ulcer. He's been tolerating the dressing changes without complication. Fortunately there's no signs of active infection. He still has some hyper granular tissue and I do think it would be appropriate to continue with the chemical cauterization as of today. 12/16/18 on evaluation today patient actually appears to be doing okay in regard to his right gluteal ulcer. He is been tolerating the dressing  changes without complication including the Wound VAC. Overall I feel like nothing seems to be worsening I do feel like that the hyper granulation buds in the central portion of the wound have improved to some degree with the silver nitrate. We will have to see how things continue to progress. 12/20/18 on evaluation today patient actually appears to be doing much worse in my pinion even compared to last week's evaluation. Unfortunately as opposed to showing any signs of improvement the areas of hyper granular tissue in the central portion of the wound seem to be getting worse. Subsequently the wound bed itself also seems to be getting deeper even compared to last week which is both unusual as well as concerning since prior he had been shown signs of improvement. Nonetheless I think that the issue could be that he's actually having some difficulty in issues with a deeper infection. There's no external signs of infection but nonetheless I am more worried about the internal, osteomyelitis, that could be restarting. He has not been on antibiotics for some time at this point. I think that it may be a good idea to go ahead and started back on an antibiotic therapy while we wait to see what the testing shows. 12/27/18 on evaluation today patient presents for follow-up concerning his left gluteal fold wound. Fortunately he appears to be doing well today. I did review the CT scan which was negative for any signs of osteomyelitis or acute abnormality this is excellent news. Overall I feel like the surface of the wound bed  appears to be doing significantly better today compared to previously noted findings. There does not appear any signs of infection nor does he have any pain at this time. 01/03/19 on evaluation today patient actually appears to be doing quite well in regard to his ulcer. Post debridement last week he really did not have too much bleeding which is good news. Fortunately today this seems to be doing some better but we still has some of the hyper granular tissue noted in the base of the wound which is gonna require sharp debridement today as well. Overall I'm pleased with how things seem to be progressing since we switched away from the Wound VAC I think he is making some progress. 01/10/19 on evaluation today patient appears to be doing better in regard to his right gluteal fold ulcer. He has been tolerating the dressing changes without complication. The debridement to seem to be helping with current away some of the poor hyper granular tissue bugs throughout the region of his gluteal fold wound. He's been tolerating the dressing changes otherwise without complication which is great news. No fevers, chills, nausea, or vomiting noted at this time. 01/17/19 on evaluation today patient actually appears to be doing excellent in regard to his wound. He's been tolerating the dressing changes without complication. Fortunately there is no signs of active infection at this time which is great news. No fevers, chills, nausea, or vomiting noted at this time. 01/24/19 on evaluation today patient actually appears to be doing quite well with regard to his ulcer. He has been tolerating the dressing changes without complication. Fortunately there's no signs of active infection at this time. Overall been very pleased with the progress that he seems to be [name], [name]. ([id number]) making currently. 01/31/19-Patient returns at 1 week with apparent similarity in dimensions to the wound, with no signs of infection, he  has been changing dressings twice a day 02/08/19 upon evaluation today patient actually  appears to be doing well with regard to his right Ischial ulcer. The wound is not appear to be quite as deep and seems to be making progress which is good news. With that being said I'm still reluctant to go back to the Wound VAC at this point. He's been having to change the dressings twice a day which is a little bit much in my pinion from the wound care supplies standpoint. I think that possibly attempting to utilize extras orbit may be beneficial this may also help to prevent any additional breakdown secondary to fluid retention in the wound itself. The patient is in agreement with giving this a try. 02/15/19 on evaluation today patient actually appears to be doing decently well with regard to his ulcer in the right to gluteal fold location. He's been tolerating the dressing changes without complication. Fortunately there is no signs of active infection at this time. He is able to keep the current dressing in place more effectively for a day at a time whereas before he was having a changes to to three times a day. The actions or has been helpful in this regard. Fortunately there's no signs of anything getting worse and I do feel like he showing signs of good improvement with regard to the wound bed status. 02/22/2019 patient appears to be doing very well today with regard to his ulcer in the gluteal fold. Fortunately there is no signs of active infection and he has been tolerating the dressing changes without any complication. Overall extremely pleased with how things seem to be progressing. He has much less of the hyper granular projections within the wound these have slowly been debrided away and he seems to be doing well. The wound bed is more uniform. 03/01/19 on evaluation today patient appears to be doing unfortunately about the same in regard to his gluteal ulcer. He's been tolerating the dressing changes  without complication. Fortunately there's no signs of active infection at this time. With that being said he continues to develop these hyper granular projections which I'm unsure of exactly what they are and why they are rising. Nonetheless I explained to the patient that I do believe it would be a good idea for [place] to stand a biopsy sample for pathology to see if that can shed any light on what exactly may be going on here. Fortunately I do not see any obvious signs of infection. With that being said the patient has had a little bit more drainage this week apparently compared to last week. 03/08/2019 on evaluation today patient actually appears to look somewhat better with regard to the appearance of his wound bed at this time. This is good news. Overall I am very pleased with how things seem to have progressed just in the past week with a switch to the [facility] dressing. I think that has been beneficial for him. With that being said at this time the patient is concerned about his biopsy that I sent off last week unfortunately I do not have that report as of yet. Nonetheless we have called to obtain this and hopefully will hear back from the lab later this morning. 03/15/19 on evaluation today patient's wound actually appears to be doing okay today with regard to the overall appearance of the wound bed. He has been tolerating the dressing changes without complication he still has hyper granular tissue noted but fortunately that seems to be minimal at this point compared to some of what we've seen in the  past. Nonetheless I do think that he is still having some issues currently with some of this type of granulation the biopsy and since all showed nothing more than just evidence of granulation tissue. Therefore there really is nothing different to initiate or do at this point. 03/24/19 on evaluation today patient appears to be doing a little better with regard to his ulcer. He's been tolerating the  dressing changes without complication. Fortunately there is no signs of active infection at this time. No fevers, chills, nausea, or vomiting noted at this time. I'm overall pleased with how things seem to be progressing. 03/29/2019 on evaluation today patient appears to be doing about the same in regard to his ulcer in the right gluteal fold. Unfortunately he is not seeming to make a lot of progress and the wound is somewhat stalled. There is no signs of active infection externally but I am concerned about the possibility of infection continuing in the ischial location which previously he did have infection noted. Again is not able to have an MRI so probably our best option for testing for this would be a triple phase bone scan which will detect subtle changes in the bone more so than plain film x-rays. In the past they really have not been beneficial and in fact the CT scans even have been somewhat questionable at times. Nonetheless there is no signs of systemic infection which is at least good news but again his wound is not healing at all on the predicted schedule. 04/05/19 on evaluation today patient appears to be doing well all things considering with regard to his wound and the right gluteal fold. He actually has his triple bone scan scheduled for sometime in the next couple of days. With that being said I've also been looking to other possibilities of what could be causing hyper granular tissue were looking into the bone scan again for evaluating for the risk or possibility of infection deeper and I'm also gonna go ahead and see about obtaining a deep tissue culture today to send and see if there's any evidence of infection noted on culture. He's in agreement with that plan. 04/12/2019 on evaluation today patient presents for reevaluation here in our clinic concerning ongoing issues with his right gluteal fold ulcer. 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 It was discussed with him at that point that I think it would be appropriate for him to proceed with hyperbaric oxygen therapy especially in light of the fact that he is previously been on IV antibiotics at the beginning of the year for close to 3 months followed by several weeks of oral antibiotics that was all prescribed by infectious disease. He had surgical debridement around Christmas December 2019 due to an abscess and osteomyelitis of the ischial bone. Unfortunately this has not really proceeded as well as we would have liked and again we did a CT scan even a couple months ago as he cannot have an MRI secondary to having issues with both a pain pump as well as a spinal cord stimulator which prevent him from going into an MRI machine. With that being said there were chronic changes noted at the ischial tuberosity which had progressed since December 2019 there was no evidence of fluid collection on that initial CT scan. With that being said that was on January 21, 2019. I am not sure that it  was a sensitive enough test as compared to an MRI and then subsequently I ordered a bone scan for him which was actually completed on 03/29/2019 and this revealed that he does indeed have positive osteomyelitis involving the right ischial tuberosity. This is adjacent to the ulcer and I think is the reason that his ulcer is not healing. Subsequently I am in a place him back on oral antibiotics today unfortunately his wound culture showed just mixed gram-negative flora with no specific findings of a predominant organism. Nonetheless being with the location I think a good broad-spectrum antibiotic for gram-negative's is a good choice at this point he is previously taken Levaquin without significant issues and I think that is an appropriate antibiotic for him at this time. 04/19/2019 on evaluation today patient actually appears to be  doing fairly well with regard to his wound. He has been tolerating the dressing changes without complication. Fortunately there is no signs of active infection and he has been taking the oral antibiotics at this time. Subsequently we did make a referral to infectious disease although Dr. Steva Ready wants the patient to be seen by general surgery first in order apparently to see if there is anything from a surgical standpoint that should be done prior to initiation of IV antibiotic therapy. Again the patient is okay with actually seeing Dr. Celine Ahr whom he has seen before we will make that referral. 04/26/2019 on evaluation today patient actually appears to be doing well with regard to his wound. He has been tolerating the dressing changes [name], [name]. ([id number]) without complication. Fortunately there is no signs of active infection at this time. I do believe that the hyperbaric oxygen therapy along with the antibiotics which I prescribed at this point have been doing well for him. With that being said he has not seen Dr. Celine Ahr the surgeon yet in fact they have not been contacted for scheduling an appointment as of yet. Subsequently the patient is not on antibiotics currently by IV Dr. Steva Ready did want him to see Dr. Celine Ahr first which we are working on trying to get scheduled. We again give the information to the patient today for Dr. Celine Ahr and her number as far as contacting their office to see about getting something scheduled. Again were looking for whether or not they recommend any surgical intervention. 05/03/2019 on evaluation today patient appears to be doing about the same with regard to his wound there may be a little bit of filling in in regard to the base of the wound but still he has quite a bit of hyper granular tissue that I am seeing today. I believe that he may may need a more aggressive sharp debridement possibly even by the surgeon or under anesthesia in order to clear  away some of the hyper granular material in order to help allow for appropriate granulation to fill in. We have made a referral to Dr. Celine Ahr unfortunately it sounds as if they did not receive the referral we contacted them today they should be get in touch with the patient. Depending on how things show from that standpoint the patient may need to see Dr. Ardyth Gal who is the infectious disease specialist although he really does not want a PICC line again. No fevers, chills, nausea, vomiting, or diarrhea. He is tolerating hyperbaric oxygen therapy very well. 05/12/2019 on evaluation today patient presents for follow-up visit concerning his right gluteal fold ulcer. He did see Dr. Celine Ahr the surgeon who previously evaluated his wound and  she actually felt like he was doing quite well with regard to the wound based on what she was seen. He does seem to be responding to some degree with the oral antibiotics along with hyperbaric oxygen therapy at this point she did not see any evidence or need for further surgical intervention at this point. She recommended deferring additional or ongoing antibiotic therapy to Dr. Steva Ready at her discretion. Fortunately there is no signs of active systemic infection. No fevers, chills, nausea, vomiting, or diarrhea. 10/29; patient I am seeing really for the first time today in terms of his wound. 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 He is being treated with hyperbaric oxygen for underlying osteomyelitis most recently documented by a three-phase bone scan. He has been on Levaquin for roughly a month and he is out of these and asked me for a refill. Most recent cultures were negative although I do not think these were bone cultures. He has a very irregular surface to this wound which is almost nodular in texture. It undermines superiorly with the same nodular hyper granulated surface. I see that he was referred to general surgery for an  operative debridement although the surgeon did not agree with this I think that would have been the proper course of action in this. He has been using Hydrofera Blue. He is supposed to start a wound VAC tomorrow which is actually a reapplication of wound VAC. I think mostly last time they had trouble keeping the seal in place I hope we have better look at this this time. 05/24/2019 on evaluation today I did review patient's note from Dr. Dellia Nims where he was seen last week when I was out of the office. Subsequently it does appear that Dr. Dellia Nims was in agreement that the patient really needed debridement to clear away some of the nodular tissue in the base of the wound. The good news is he has the wound VAC initiated and some of this tissue is already starting to breakdown under the treatment of the wound VAC again because it is not really viable. Nonetheless I do believe that we can likely perform some debridement today to clear this away and hopefully the continuation of the wound VAC along with hyperbarics and antibiotic therapy will be beneficial in stopping this from reoccurring. If we get better control in the wound bed in a good spot I think we can definitely use the PriMatrix which we actually did get approval for. 05/31/2019 on evaluation today patient appears to be doing actually pretty well at this point with regard to his wound. He has been tolerating the dressing changes which is good news. Fortunately there is no signs of infection. He still has some of the hyper granular nodules noted we have not really cleared all these away and I think we can try to do a little bit more that today. I did do quite a bit last week I am hoping to get down to the base of the wound so though actually be able to heal more appropriately. I think the wound VAC is doing a good job right now with controlling the drainage and overall the patient is happy with that. 06/07/2019 upon evaluation today patient appears  to be doing quite well compared to last week with regard to the hyper granulation in the base of the wound. He has been tolerating the dressing changes without complication. Fortunately there is no signs of active infection at this time. With  that being said we have not had any recent measures as far as blood work is concerned I think we may want to check a few things including a CBC, sed rate, and C-reactive protein. Patient is in agreement with that we can try to get that scheduled for him for this Friday. 06/13/2019 on evaluation today patient actually appears to be doing much better at this point based on what I am seeing with regard to his wound. He has much less in the way of hyper granular tissue which is good news and a lot more healing that is taken place since I last saw him as far as the amount of space within the wound itself. With that being said he is nearing the completion of the initial 40 treatments for hyperbaric oxygen therapy I think this is something I would recommend extending as he does seem to be benefiting at this time. Fortunately there is no evidence of active systemic infection at this point nor even local infection for that matter. 06/20/2019 upon evaluation today patient actually appears to be doing well with regard to his wound. In fact this appears to be doing much better today compared to what it was previous. I been extremely pleased with the progress that he is made. In fact the tunnel is much less deep today than what it was in the past and I am seeing excellent signs of improvement. Overall I am happy with how things are going. I did review his white blood cell count which revealed everything to be okay. The one abnormal finding on his CBC was a hemoglobin of 12.7. Subsequently I did also review his sed rate and C-reactive protein. His sed rate measured in at normal level measuring at 26. The C-reactive protein however was elevated today and an abnormal range  indicating inflammation. Still I feel like he is trending in a good direction at this time. He is going to need a refill of the Levaquin he tells me at this point. 07/04/2019 on evaluation today patient actually appears to be doing well in regard to his wound. He has been tolerating the dressing changes without complication. Fortunately there is no signs of active infection which is good news. No fevers, chills, nausea, vomiting, or diarrhea. The good news also is that I did review his labs and though his sed rate was slightly elevated the C-reactive protein was actually down compared to the last evaluation 2 weeks ago this is excellent news I think it is showing signs of improvement. 07/11/2019 on evaluation today patient actually appears to be doing quite well with regard to his wound on my evaluation. This is very slow going but nonetheless he seems to be making progress. Especially in regard to the depth. Nonetheless I believe that we may want to consider going forward with the PriMatrix which was previously approved for him. We will get a have to get a new approval as we will actually be applying this in the new year so we will likely obtain that approval on January 4. In the meantime and for the time being my suggestion is going to be that we go ahead and continue with the wound VAC and we were going at this point with the current wound care measures. 12/28-Patient seen after HBO session today, wound depth with the tunnel measures 3.2 cm, the wound bed appears healthy, continuing with the VAC and HBO 07/25/2019 on evaluation today patient's wound appears to be doing about the same compared to my  last evaluation. Fortunately there is no evidence of significant infection which is good news we will still waiting on the results of his lab work which unfortunately was not done prior to today's visit which is what we were aiming for. Ultimately there is no sign of active infection at this time which is  good news. And he also tells me that the wound VAC has been staying in place and doing fairly well which is also good news. With that being said I am concerned about the fact [name], [name]. ([id number]) that the wound has not progressed as much I think we may want to look into getting reapproval for the skin substitute, specifically PriMatrix, that I am hopeful will help to allow this area to granulate in more effectively. I would recommend as well continuing with the wound VAC along with this. 08/01/2019 on evaluation today patient appears to be doing in my opinion slightly better in regard to the wound. He actually does have some tightening of the walls around the edges which is a good thing. Fortunately there is no signs of active infection at this time. No fever chills noted. I do believe that the patient in general seems to be making progress although is very slow. Obviously were trying to get things to speed up somewhat more. No fevers, chills, nausea, vomiting, or diarrhea. 08/08/2019 upon evaluation today patient appears to be doing well with regard to his wound although there is not a significant improvement overall we do have the PriMatrix for availability today to apply. Fortunately there is no signs of active infection and the patient overall seems to be doing well. There is really no need for aggressive sharp debridement at this point either which is also good news. I do believe her get a need to suture and the primary checks in the basin in the PriMatrix in order to keep it in place at the base of the wound underneath the wound VAC. This was discussed with the patient today as well. He is in agreement the plan. He really does not have any filling in the area and so this should not be a complication as far as suturing is concerned. 08/15/2019 on evaluation today patient appears to be doing somewhat better in my opinion in regard to the overall appearance of his wound. He has been  tolerating the dressing changes without complication. Fortunately the skin subappear to still be in place when this was changed on Friday. With that being said it appears that the PriMatrix may have dissolved in the interim between now and then as the sutures are no longer holding anything in place and again I would sutured it into place. Nonetheless I believe that we can go ahead and likely consider going forward that we may need to apply the PriMatrix weekly as opposed every other week as it does seem to have dissolved rather readily and well. Overall the wound bed appears to be doing excellent at this time. 08/22/2019 upon evaluation today the patient does seem to making some progress with regard to his wound. Overall there is some better quality tissue and less of the hyper granular protrusions all of which is good news. With that being said we do have the PriMatrix available for application today and I am in a try to find a better way to suture this in place for him today. 08/29/2019 on evaluation today patient appears to be doing very well in regard to his wound. I do see signs of  new granulation which seems to be somewhat helpful he did have some need for sharp debridement today to clear some of the hyper granulation away but nonetheless I feel like we are headed in a good direction. Fortunately there is no evidence of any systemic infection although we are going to go ahead and reorder the CBC, C-reactive protein, and sed rate today to see where things stand I did just refill his Levaquin as well. 09/05/2019 upon evaluation today patient's wound actually is not appearing to be showing signs of significant improvement compared to what we have seen previous from the standpoint of depth. He overall wound size wise seems to be about the same and again some of the quality of the wound bed is still doing fairly well. I did review his labs and his CBC was normal with no signs of elevation white blood cell  count, his C-reactive protein was normal, and the sed rate was slightly elevated but again I think this may just be due to ulceration really there is nothing significant to indicate severe infection. I think when he likely finishes his current round of oral antibiotics with Levaquin will likely take him off of the medication at that point based on the findings. I do feel like the hyperbarics helped in getting the infection under control as well as improving the overall size of the wound quite significantly. With that being said at this point I really feel like more more at the time frame that he may benefit from a muscle flap in order to try to help this wound heal effectively. I think that this would be the fastest way in order to do this. In the past he has been somewhat resistant to going down that path but nonetheless I feel like he likely should. 09/12/2019 on evaluation today patient appears to be doing fairly well in regard to his wound. He is tolerating the wound VAC without complication is still continues to drain significantly. Fortunately there is no signs of active infection at this time which is great news his lab work is also been doing very well. With that being said I do think at this point that the patient is still deriving benefit from the wound VAC especially in regard to controlling moisture which I think it is doing fairly well. He also has some improvement in the overall wound bed and on the medial aspect of the wound there are some hyper granular projections but nothing as severe as what they have been in the past. I am good have to perform some sharp debridement at this location today. 09/19/2019 upon evaluation today patient appears to be doing roughly the same although again the tissue does seem to be showing some signs of improvement which is good news. There is less hyper granular budding. Nonetheless he still is going require some sharp debridement today. We did get his  albumin and prealbumin results back and they are within normal limits in both cases which is excellent news that was the one thing [facility] plastic surgery was waiting on order to get him scheduled. He does seem to be a good candidate physically for the flap surgery if he decides to go that direction. With that being said is still left to be determined once they see the wound what they feel about how best to proceed. Obviously in the end it would be up to Mr. Bartel as to whether he goes forward with the flap surgery or something else depending on the recommendations.  09/26/2019 upon evaluation today patient appears to be doing about the same in regard to his wound. Fortunately there is no signs of active infection there is also not as much hyper granulation noted today in fact I do not believe there is can be any sharp debridement required at this point. With that being said I do believe he continues to be somewhat stalled at this point with regard to his wound. There is no signs of active infection at this time which is good news. He does have an appointment with [facility] plastic surgery on Friday. 10/03/2019 upon evaluation today patient appears to be doing somewhat poorly especially in his overall demeanor compared to last week's evaluation. He unfortunately did see the physician at [facility] plastic surgery. Unfortunately the plan for the possibility of a flap would be a fairly significant surgery that the patient is really not wanting to take the risk of as if the surgery failed he would end up with a very large open wound at that point. Fortunately there is no signs of active infection at this time. No fevers, chills, nausea, vomiting, or diarrhea. With that being said there was some question that the physician had about why this would not be healing and the possibility that there may be some bone still needs to be removed deeper in the wound. Again the bone scan that we did previous actually did show evidence  of there still being a chronic osteomyelitis back on March 29, 2019. With that being said he did undergo treatment both with hyperbarics as well as oral antibiotics for quite some time since that point. Obviously he may need additional and updated imaging with the surgeon in order to see if there is anything else that can or will need to be done in order to help this area to heal. Obviously I still believe as well that there is something going on here. He is already seeing the surgeon that performed the original surgery after the bone scan but they did not feel that anything needed to be done from a surgical standpoint at that time. 10/10/2019 upon evaluation today patient appears to be really doing about the same with regard to his wound in the gluteal fold region. Unfortunately he is not making a lot of improvement the insurance also contacted home health and apparently the wound VAC is no longer to be covered due to lack of progress. Obviously that is something that we discussed as well as a strong possibility for happening soon and this was even last week that discussed this with him. With that being said again we have tried to manage this wound as best as I could up to this point. Fortunately there is no evidence of active infection at this time. No fevers, chills, nausea, vomiting, or diarrhea. SAW, MENDENHALL ([id number]) 10/17/2019 upon evaluation today patient has decided to proceed with the flap surgery. He actually seems to be in better spirits today he states he wants to get this healed and after talking to the surgeon last week he feels like that is his best option for doing so. With that being said he also needs to have a referral to infectious disease to be seen for IV antibiotics preoperatively and postoperatively as far as the skin flap is concerned. He wonders if Dr. Steva Ready would be interested or available to do this for him. I explained that I would definitely have to check  with her and I will be happy to do that but I  am not certain What her answer will be in 2 I have a discussion with her. 10/24/2019 upon evaluation today patient appears to be doing about the same with regard to his wound although he is complaining of increased pain he is really not had any discomfort he states that it is getting much worse at this point. Fortunately there is no signs of active infection. At least not systemically. With that being said I question whether local infection could be a problem here secondary to the fact that to be honest the patient is having pain where he has not previous. 10/31/2019 upon evaluation today patient appears to be doing about the same in regard to his wound in the right gluteal fold region. Subsequently in the interim since I last saw him last week I did receive a message from Dr. Steva Ready as well as reviewed her note. She was requesting the potential for a bone culture or at least a deep tissue culture in order to see about helping with more directed antibiotic therapy for the patient's projected flap surgery. If we could not obtain more specific organisms for treatment then obviously should be looking at more broad-spectrum antibiotic therapy. With that being said at this point the patient seems ready to move forward with the flap surgery. I did actually sign the paperwork for his bed which came through last week I believe that was on Thursday or Friday. Either way that should be coming to him as well so he will have that in place once the surgery occurs when he is discharged home. Fortunately there is no signs of active infection at this time. No fevers, chills, nausea, vomiting, or diarrhea. 11/15/2019 upon evaluation today patient appears to be doing about the same with regard to his right gluteal fold ulcer. He has been tolerating the dressing changes without complication right now he is awaiting the hospital bed with air mattress in order to be able to be  scheduled for his flap surgery. He is on IV vancomycin at this point is managed by Dr. Steva Ready. 11/30/2019; right gluteal fold extending over the ischial tuberosity. He had underlying osteomyelitis and there is still managing on vancomycin directed by infectious disease. His hospital bed and level 3 surface are arriving. After that there is plans for a myocutaneous flap at [facility]. They state that he will be in hospital for 2 weeks and for at least 2 weeks in a skilled facility may be longer 12/13/2019 upon evaluation today patient's wound appears to be doing about the same. Dr. Steva Ready actually did come for an evaluation of the patient here in office as well as she cannot really get him up on the bed in order to examine him in her office. Subsequently I did review his well his labs his C-reactive protein and sed rate have been doing excellent he actually appears to be maintaining quite nicely in my opinion. Was still having to pack the wound as it is quite deep and obviously he is going to proceed with the flap surgery but nonetheless I think that he is on the right track in that regard. He is currently still on the IV vancomycin although he does end on this on June 3. His surgery is scheduled for June 15. 12/22/2019 upon evaluation today patient appears to be doing okay with regard to his wound as of today. He is actually just a little over a week out from his surgery date. Subsequently his last day of antibiotic therapy is actually  today. Subsequently the PICC line will stay in place over the next week and a half per the surgeon's request. The patient will be proceeding with his flap surgery on the 15th of this month. 12/29/2019 upon evaluation today patient appears to be doing excellent in regard to his wound all things considered. He actually is having his surgery this coming Tuesday and this is can be a flap surgery. We will not be seeing him back following obviously in that eventuality.  Fortunately I do not see any signs of active infection at this time as far as a bacterial infection is concerned. I am concerned however about the possibility of a fungal infection based on what I am seeing today. I discussed this with the patient and his wife during the office visit today. Electronic Signature(s) Signed: 12/29/2019 9:13:43 AM By: Worthy Keeler [name] Entered By: Worthy Keeler on 12/29/2019 09:13:43 [name], [name] ([id number]) -------------------------------------------------------------------------------- Physical Exam Details Patient Name: [name] Date of Service: 12/29/2019 8:15 AM Medical Record Number: [id number] Patient Account Number: [id number] Date of Birth/Sex: 14-Feb-1951 (69 [name].o. M) Treating RN: Army Melia Primary Care Provider: [name] Other Clinician: Referring Provider: [name] Treating Provider/Extender: [name], Sarahbeth Cashin Weeks in Treatment: 95 Constitutional Well-nourished and well-hydrated in no acute distress. Respiratory normal breathing without difficulty. Psychiatric this patient is able to make decisions and demonstrates good insight into disease process. Alert and Oriented x 3. pleasant and cooperative. Notes Patient's wound actually appears to be doing fairly well which is great news there is no evidence of infection. With that being said I do feel like the periwound shows some evidence of a fungal infection and I would like to try to help take care of that prior to his surgery. For that reason I am going to give him a prescription for Diflucan I am good also given a prescription for nystatin powder to use around the area for now. Electronic Signature(s) Signed: 12/29/2019 9:14:09 AM By: Worthy Keeler [name] Entered By: Worthy Keeler on 12/29/2019 09:14:08 PAIGE, VANDERWOUDE ([id number]) -------------------------------------------------------------------------------- Physician Orders Details Patient Name:  [name] Date of Service: 12/29/2019 8:15 AM Medical Record Number: [id number] Patient Account Number: [id number] Date of Birth/Sex: Oct 09, 1950 (69 [name].o. 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 - in office o Cleanse wound with mild soap and water Anesthetic (add to Medication List) Wound #1 Right Gluteal fold o Topical Lidocaine 4% cream applied to wound bed prior to debridement (In Clinic Only). Skin Barriers/Peri-Wound Care o Skin Prep o Antifungal powder-Nystatin Primary Wound Dressing Wound #1 Right Gluteal fold o Silver Alginate Secondary Dressing Wound #1 Right Gluteal fold o Tegaderm o XtraSorb Dressing Change Frequency Wound #1 Right Gluteal fold o Change dressing every other day. Follow-up Appointments Wound #1 Right Gluteal fold o Other: - as needed Off-Loading Wound #1 Right Gluteal fold o Turn and reposition every 2 hours - no pressure on wounded area Home Health Wound #1 Right Gluteal fold o [facility] Visits - Ray Nurse  may visit PRN to address patientos wound care needs. o FACE TO FACE ENCOUNTER: MEDICARE and MEDICAID PATIENTS: I certify that this patient is under my care and that I had a face-to-face encounter that meets the physician face-to-face encounter requirements with this patient on this date. The [name], [name] ([id number]) encounter with the patient was in whole or in part for the following MEDICAL CONDITION: (primary reason for [place]) MEDICAL NECESSITY: I certify, that based on my findings, NURSING services are a medically necessary home health service. HOME BOUND STATUS: I certify that my clinical findings support that this patient is homebound (i.e., Due to illness or injury, pt requires aid of supportive devices such as crutches, cane, wheelchairs, walkers, the use of special transportation or the assistance of another person to leave their place of residence. There is a normal inability to leave the home and doing so requires considerable and taxing effort. Other absences are for medical reasons / religious services and are infrequent or of short duration when for other reasons). o If current dressing causes regression in wound condition, may D/C ordered dressing product/s and apply Normal Saline Moist Dressing daily until next [place] / Other MD appointment. [place] of regression in wound condition at [phone]. o Please direct any NON-WOUND related issues/requests for orders to patient's Primary Care Physician Patient Medications Allergies: penicillin, doxycycline, clindamycin Notifications Medication Indication Start End Diflucan 12/29/2019 DOSE 1 - oral 150 mg tablet - 1 tablet oral taken 1 times today nystatin 12/29/2019 DOSE topical 100,000 unit/gram powder - powder topical applied to the area surrounding the wound which is red and inflamed with each dressing change as directed Electronic Signature(s) Signed: 12/29/2019 9:17:00 AM By: Worthy Keeler [name] Entered By: Worthy Keeler on 12/29/2019 09:16:58 VADHIR, MCNAY ([id number]) -------------------------------------------------------------------------------- Problem List Details Patient Name: [name] Date of Service: 12/29/2019 8:15 AM Medical Record  Number: [id number] Patient Account Number: [id number] Date of Birth/Sex: October 19, 1950 (69 [name].o. 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 He states that there is no history of diabetes that he is aware of. He does have issues with his prostate and is currently  receiving treatment  for this by way of oral medication. With that being said I do not have a lot of details in that regard. Nonetheless the patient presents today as a result of having been referred to [place] by another provider initially home health was set to come out and take care of his wound although due to the fact that he apparently drives he's not able to receive home health. His wife is therefore trying to help take care of this wound within although they have been struggling with what exactly to do at this point. She states that she can do some things but she is definitely not a nurse and does have some issues with looking at blood. The good news is the wound does not appear to be too deep and is fairly superficial at this point. There is no slough noted there is some nonviable skin noted around the surface of the wound and the perimeter at this point. The central portion of the wound appears to be very good with a dermal layer noted this does not appear to be again deep enough to extend it to subcutaneous tissue at this point. 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 In the pump he has Dilaudid and baclofen. 12/07/17 on evaluation today patient presents for follow-up concerning his ongoing lower back thigh ulcer on the right. He states that he did not get the supplies ordered and therefore has not really been able to perform the dressing changes as directed exactly. His wife was able to get some Boarder Foam Dressing's from the drugstore and subsequently has been using hydrogel which did help to a degree in the wound does appear to be able smaller. There is actually more drainage this week noted than previous. 12/21/17 on evaluation today patient appears to be doing rather well in regard to his right gluteal ulcer. He has been tolerating the dressing changes without complication. There does  not appear to be any evidence of infection at this point in time. Overall the wound does seem to be making some progress as far as the edges are concerned there's not as much in the way of overlapping of the external wound edges and he has a good epithelium to wound bed border for the most part. This however is not true right at the 12 o'clock location over the span of a little over a centimeters which actually will require debridement today to clean this away and hopefully allow it to continue to heal more appropriately. 12/28/17 on evaluation today patient appears to be doing rather well in regard to his ulcer in the left gluteal region. He's been tolerating the dressing changes without complication. Apparently he has had some difficulty getting his dressing material. Apparently there's been some confusion with ordering we're gonna check into this. Nonetheless overall he's been showing signs of improvement which is good news. Debridement is not required today. 01/04/18 on evaluation today patient presents for follow-up concerning his right gluteal ulcer. He has been tolerating the dressing changes fairly well. On inspection today it appears he may actually have some maceration them concerned about the fact that he may be developing too much moisture in and around the wound bed which can cause delay in healing. With that being said he unfortunately really has not showed significant signs of improvement since last week's evaluation in fact this may even be just the little bit/slightly larger.  Nonetheless he's been having a lot of discomfort I'm not sure this is even related to the wound as he has no pain when I'm to breeding or otherwise cleaning the wound during evaluation today. Nonetheless this is something that we did recommend he talked to his pain specialist concerning. 01/11/18 on evaluation today patient appears to be doing better in regard to his ulceration. He has been tolerating the dressing  changes without complication. With that being said overall there's no evidence of infection which is good news. The only thing is he did receive the hatch affair blue classic versus the ready nonetheless I feel like this is perfectly fine and appears to have done well for him over the past week. 01/25/18 on evaluation today patient's wound actually appears to be a little bit larger than during the last evaluation. The good news is the majority of the wound edges actually appear to be fairly firmly attached to the wound bed unfortunately again we're not really making progress in regard to the size. Roughly the wound is about the same size as when I first saw him although again the wound margin/edges appear to be much better. 02/01/18 on evaluation today patient actually appears to be doing very well in regard to his wound. Applying the Prisma dry does seem to be better although he does still have issues with slow progression of the wound. There was a slight improvement compared to last week's measurements today. Nonetheless I have been considering other options as far as the possibility of Theraskin or even a snap vac. In general I'm not sure that the Theraskin due to location of the wound would be a very good idea. Nonetheless I do think that a snap vac could be a possibility for the patient and in fact I think this could even be an excellent way to manage the wound possibly seeing some improvement in a very rapid fashion here. Nonetheless this is something that we would need to get approved and I did have a lengthy conversation with the patient about this today. 02/08/18 on evaluation today patient appears to be doing a little better in regard to his ulcer. He has been tolerating the dressing changes without complication. Fortunately despite the fact that the wound is a little bit smaller it's not significantly so unfortunately. We have discussed the possibility of a snap vac we did check with insurance  this is actually covered at this point. Fortunately there does not appear to be any sign of infection. Overall I'm fairly pleased with how things seem to be appearing at this point. 02/15/18 on evaluation today patient appears to be doing rather well in regard to his right gluteal ulcer. Unfortunately the snap vac did not stay in place with his sheer and friction this came loose and did not seem to maintain seal very well. He worked for about two days and it did seem to do very well during that time according to his wife but in general this does not seem to be something that's gonna be beneficial for him long-term. PEGGY, MONK ([id number]) do believe we need to go back to standard dressings to see if we can find something that will be of benefit. 03/02/18- He is here in follow up evaluation; there is minimal change in the wound. He will continue with the same treatment plan, would consider changing to iodosrob/iodoflex if ulcer continues to to plateau. He will follow up next week 03/08/18 on evaluation today patient's wound actually appears  to be about the same size as when I previously saw him several weeks back. Unfortunately he does have some slightly dark discoloration in the central portion of the wound which has me concerned about pressure injury. I do believe he may be sitting for too long a period of time in fact he tells me that "I probably sit for much too long". He does have some Slough noted on the surface of the wound and again as far as the size of the wound is concerned I'm really not seeing anything that seems to have improved significantly. 03/15/18 on evaluation today patient appears to be doing fairly well in regard to his ulcer. The wound measured pretty much about the same today compared to last week's evaluation when looking at his graph. With that being said the area of bruising/deep tissue injury that was noted last week I do not see at this point. He did get a new  cushion fortunately this does seem to be have been of benefit in my pinion. It does appear that he's been off of this more which is good news as well I think that is definitely showing in the overall wound measurements. With that being said I do believe that he needs to continue to offload I don't think that the fact this is doing better should be or is going to allow him to not have to offload and explain this to him as well. Overall he seems to be in agreement the plan I think he understands. The overall appearance of the wound bed is improved compared to last week I think the Iodoflex has been beneficial in that regard. 03/29/18 on evaluation today patient actually appears to be doing rather well in regard to his wound from the overall appearance standpoint he does have some granulation although there's some Slough on the surface of the wound noted as well. With that being said he unfortunately has not improved in regard to the overall measurement of the wound in volume or in size. I did have a discussion with him very specifically about offloading today. He actually does work although he mainly is just sitting throughout the day. He tells me he offloads by "lifting himself up for 30 seconds off of his chair occasionally" purchase from [employer] which does seem to have helped. And he has a new cushion that he with that being said he's also able to stand some for a very short period of time but not significant enough I think to provide appropriate offloading. I think the biggest issue at this point with the wound and the fact is not healing as quickly as we would like is due to the fact that he is really not able to appropriately offload while at work. He states the beginning after his injury he actually had a bed at his job that he could lay on in order to offload and that does seem to have been of help back at that time. Nonetheless he had not done this in quite some time unfortunately. I  think that could be helpful for him this is something I would like for him to look into. 04/05/18 on evaluation today patient actually presents for follow-up concerning his right gluteal ulcer. Again he really is not significantly improved even compared to last week. He has been tolerating the dressing changes without complication. With that being said fortunately there appears to be no evidence of infection at this time. He has been more proactive in trying to  offload. 04/12/18 on evaluation today patient actually appears to be doing a little better in regard to his wound and the right gluteal fold region. He's been tolerating the dressing changes since removing the oasis without complication. However he was having a lot of burning initially with the oasis in place. He's unsure of exactly why this was given so much discomfort but he assumes that it was the oasis itself causing the problem. Nonetheless this had to be removed after about three days in place although even those three days seem to have made a fairly good improvement in regard to the overall appearance of the wound bed. In fact is the first time that he's made any improvement from the standpoint of measurements in about six weeks. He continues to have no discomfort over the area of the wound itself which leads me to wonder why he was having the burning with the oasis when he does not even feel the actual debridement's themselves. I am somewhat perplexed by this. 04/19/18 on evaluation today patient's wound actually appears to be showing signs of epithelialization around the edge of the wound and in general actually appears to be doing better which is good news. He did have the same burning after about three days with applying the Endoform last week in the same fashion that I would generally apply a skin substitute. This seems to indicate that it's not the oasis to cause the problem but potentially the moisture buildup that just causes things  to burn or there may be some other reaction with the skin prep or Steri-Strips. Nonetheless I'm not sure that is gonna be able to tolerate any skin substitute for a long period of time. The good news is the wound actually appears to be doing better today compared to last week and does seem to finally be making some progress. 04/26/18 on evaluation today patient actually appears to be doing rather well in regard to his ulcer in the right gluteal fold. He has been tolerating the dressing changes without complication which is good news. The Endoform does seem to be helping although he was a little bit more macerated this week. This seems to be an ongoing issue with fluid control at this point. Nonetheless I think we may be able to add something like Drawtex to help control the drainage. 05/03/18 on evaluation today patient appears to actually be doing better in regard to the overall appearance of his wound. He has been tolerating the dressing changes without complication. Fortunately there appears to be no evidence of infection at this time. I really feel like his wound has shown signs as of today of turning around last week I thought so as well and definitely he could be seen in this week's overall appearance and measurements. In general I'm very pleased with the fact that he finally seems to be making a steady but sure progress. The patient likewise is very pleased. 05/17/18 on evaluation today patient appears to be doing more poorly unfortunately in regard to his ulcer. He has been tolerating the dressing changes without complication. With that being said he tells me that in the past couple of days he and his wife have noticed that we did not seem to be doing quite as well is getting dark near the center. Subsequently upon evaluation today the wound actually does appear to be doing worse compared to previous. He has been tolerating the dressing changes otherwise and he states that he is not been  sitting up anymore  than he was in the past from what he tells me. Still he has continued to work he states "I'm tired of dealing with this and if I have to just go home and lay in the bed all the time that's what I'll do". Nonetheless I am concerned about the fact that this wound does appear to be deeper than what it was previous. 05/24/18 upon evaluation today patient actually presents after having been in the hospital due to what was presumed to be sepsis secondary to the wound infection. He had an elevated white blood cell count between 14 and 15. With that being said he does seem to be doing somewhat better now. His wound still is giving him some trouble nonetheless and he is obviously concerned about the fact likely talked about that this does seem to go more deeply than previously noted. I did review his wound culture which showed evidence of Staphylococcus aureus him and group B strep. Nonetheless he is on antibiotics, Levaquin, for this. Subsequently I did review his intake summary from the hospital as well. I also did look at the CT of the lumbar spine with contrast that was performed which showed no bone destruction to suggest lumbar disguises/osteomyelitis or sacral osteomyelitis. There was no paraspinal abscess. Nonetheless it appears this may have been more of just a soft tissue infection at this point which is good news. He still is nonetheless concerned about the wound which again I think is completely reasonable considering everything he's been through recently. 05/31/18 on evaluation today on evaluation today patient actually appears to be showing signs of his wound be a little bit deeper than what I would like to see. Fortunately he does not show any signs of significant infection although his temperature was 99 today he states he's been GLENNIE, RODDA. ([id number]) checking this at home and has not been elevated. Nonetheless with the undermining that I'm seeing at this point I  am becoming more concerned about the wound I do think that offloading is a key factor here that is preventing the speedy recovery at this point. There does not appear to be any evidence of again over infection noted. He's been using Santyl currently. 06/07/18 the patient presents today for follow-up evaluation regarding the left ulcer in the gluteal region. He has been tolerating the Wound VAC fairly well. He is obviously very frustrated with this he states that to mean is really getting in his way. There does not appear to be any evidence of infection at this time he does have a little bit of odor I do not necessarily associate this with infection just something that we sometimes notice with Wound VAC therapy. With that being said I can definitely catch a tone of discontentment overall in the patient's demeanor today. This when he was previously in the hospital an CT scan was done of the lumbar region which did not reveal any signs of osteomyelitis. With that being said the pelvis in particular was not evaluated distinctly which means he could still have some osteonecrosis I. Nonetheless the Wound VAC was started on Thursday I do want to get this little bit more time before jumping to a CT scan of the pelvis although that is something that I might would recommend if were not see an improvement by that time. 06/14/18 on evaluation today patient actually appears to be doing about the same in regard to his right gluteal ulcer. Again he did have a CT scan of the lumbar spine unfortunately  this did not include the pelvis. Nonetheless with the depth of the wound that I'm seeing today even despite the fact that I'm not seeing any evidence of overt cellulitis I believe there's a good chance that we may be dealing with osteomyelitis somewhere in the right Ischial region. No fevers, chills, nausea, or vomiting noted at this time. 06/21/18 on evaluation today patient actually appears to be doing about the same  with regard to his wound. The tunnel at 6 o'clock really does not appear to be any deeper although it is a little bit wider. I think at this point you may want to start packing this with white phone. Unfortunately I have not got approval for the CT scan of the pelvis as of yet due to the fact that Medicare apparently has been denied it due to the diagnosis codes not being appropriate according to Medicare for the test requested. With that being said the patient cannot have an MRI and therefore this is the only option that we have as far as testing is concerned. The patient has had infection and was on antibiotics and been added code for cellulitis of the bottom to see if this will be appropriate for getting the test approved. Nonetheless I'm concerned about the infection have been spread deeper into the Ischial region. 06/28/18 on evaluation today patient actually appears to be doing rather well all things considered in regard to the right gluteal ulcer. He has been tolerating the dressing changes without complication. With that being said the Wound VAC he states does have to be replaced almost every day or at least reinforced unfortunately. Patient actually has his CT scan later this morning we should have the results by tomorrow. 07/05/18 on evaluation today patient presents for follow-up concerning his right Ischial ulcer. He did see the surgeon Dr. Lysle Pearl last week. They were actually very happy with him and felt like he spent a tremendous amount of time with them as far as discussing his situation was concerned. In the end Dr. Lysle Pearl did contact me as well and determine that he would not recommend any surgical intervention at this point as he felt like it would not be in the patient's best interest based on what he was seeing. He recommended a referral to infectious disease. Subsequently this is something that Dr. Ines Bloomer office is working on setting up for the patient. As far as evaluation today is  concerned the patient's wound actually appears to be worse at this point. I am concerned about how things are progressing and specifically about infection. I do not feel like it's the deeper but the area of depth is definitely widening which does have me concerned. No fevers, chills, nausea, or vomiting noted at this time. I think that we do need initiate antibiotic therapy the patient has an allow allergy to amoxicillin/penicillin he states that he gets a rash since childhood. Nonetheless she's never had the issues with Catholics or cephalosporins in general but he is aware of. 07/27/18 on evaluation today patient presents following admission to the hospital on 07/09/18. He was subsequently discharged on 07/20/18. On 07/15/18 the patient underwent irrigation and debridement was soft tissue biopsy and bone biopsy as well as placement of a Wound VAC in the OR by Dr. Celine Ahr. During the hospital course the patient was placed on a Wound VAC and recommended follow up with surgery in three weeks actually with Dr. Delaine Lame who is infectious disease. The patient was on vancomycin during the hospital course. He  did have a bone culture which showed evidence of chronic osteomyelitis. He also had a bone culture which revealed evidence of methicillin-resistant staph aureus. He is updated CT scan 07/09/18 reveals that he had progression of the which was performed on wound to breakdown down to the trochanter where he actually had irregularities there as well suggestive of osteomyelitis. This was a change just since 9 December when we last performed a CT scan. Obviously this one had gone downhill quite significantly and rapidly. At this point upon evaluation I feel like in general the patient's wound seems to be doing fairly well all things considered upon my evaluation today. Obviously this is larger and deeper than what I previously evaluated but at the same time he seems to be making some progress as far as the  appearance of the granulation tissue is concerned. I'm happy in that regard. No fevers, chills, nausea, or vomiting noted at this time. He is on IV vancomycin and Rocephin at the facility. He is currently in [employer]. 08/03/18 upon evaluation today patient's wound appears to be doing better in regard to the overall appearance at this point in time. Fortunately he's been tolerating the Wound VAC without complication and states that the facility has been taking excellent care of the wound site. Overall I see some Slough noted on the surface which I am going to attempt sharp debridement today of but nonetheless other than this I feel like he's making progress. 08/09/18 on evaluation today patient's wound appears to be doing much better compared to even last week's evaluation. Do believe that the Wound VAC is been of great benefit for him. He has been tolerating the dressing changes that is the Wound VAC without any complication and he has excellent granulation noted currently. There is no need for sharp debridement at this point. 08/16/18 on evaluation today patient actually appears to be doing very well in regard to the wound in the right gluteal fold region. This is showing signs of progress and again appears to be very healthy which is excellent news. Fortunately there is no sign of active infection by way of odor or drainage at this point. Overall I'm very pleased with how things stand. He seems to be tolerating the Wound VAC without complication. 08/23/18 on evaluation today patient actually appears to be doing better in regard to his wound. He has been tolerating the Wound VAC without complication and in fact it has been collecting a significant amount of drainage which I think is good news especially considering how the wound appears. Fortunately there is no signs of infection at this time definitely nothing appears to be worse which is good news. He has not been started on the Bactrim and  Flagyl that was recommended by Dr. Delaine Lame yet. I did actually contact her office this morning in order to check and see were things are that regard their gonna be calling me back. 08/30/18 on evaluation today patient actually appears to show signs of excellent improvement today compared to last evaluation. The undermining is getting much better the wound seems to be feeling quite nicely and I'm very pleased that the granulation in general. With that being said overall I feel like the patient has made excellent progress which is great news. No fevers, chills, nausea, or vomiting noted at this time. 09/06/18 on evaluation today patient actually appears to be doing rather well in regard to his right gluteal ulcer. This is showing signs of improvement in overall I'm very pleased  with how things seem to be progressing. The patient likewise is please. Overall I see no evidence of infection he is about to complete his oral antibiotic regimen which is the end of the antibiotics for him in just about three days. TELESFORO, BROSNAHAN ([id number]) 09/13/18 on evaluation today patient's right Ischial ulcer appears to be showing signs of continued improvement which is excellent news. He's been tolerating the dressing changes without complication. Fortunately there's no signs of infection and the wound that seems to be doing very well. 09/28/18 on evaluation today patient appears to be doing rather well in regard to his right Ischial ulcer. He's been tolerating the Wound VAC without complication he knows there's much less drainage than there used to be this obviously is not a bad thing in my pinion. There's no evidence of infection despite the fact is but nothing about it now for several weeks. 10/04/18 on evaluation today patient appears to be doing better in regard to his right Ischial wound. He has been tolerating the Wound VAC without complication and I do believe that the silver nitrate last week was beneficial  for him. Fortunately overall there's no evidence of active infection at this time which is great news. No fevers, chills, nausea, or vomiting noted at this time. 10/11/18 on evaluation today patient actually appears to be doing rather well in regard to his Ischial ulcer. He's been tolerating the Wound VAC still without complication I feel like this is doing a good job. No fevers, chills, nausea, or vomiting noted at this time. 11/01/18 on evaluation today patient presents after having not been seen in our clinic for several weeks secondary to the fact that he was on evaluation today patient presents after having not been seen in our clinic for several weeks secondary to the fact that he was in a skilled nursing facility which was on lockdown currently due to the covert 19 national emergency. Subsequently he was discharged from the facility on this past Friday and subsequently made an appointment to come in to see yesterday. Fortunately there's no signs of active infection at this time which is good news and overall he does seem to have made progress since I last saw. Overall I feel like things are progressing quite nicely. The patient is having no pain. 11/08/18 on evaluation today patient appears to be doing okay in regard to his right gluteal ulcer. He has been utilizing a Wound VAC home health this changing this at this point since he's home from the skilled nursing facility. Fortunately there's no signs of obvious active infection at this time. Unfortunately though there's no obvious active infection he is having some maceration and his wife states that when the sheets of the Wound VAC office on Sunday when it broke seal that he ended up having significant issues with some smell as well there concerned about the possibility of infection. Fortunately there's No fevers, chills, nausea, or vomiting noted at this time. 11/15/18 on evaluation today patient actually appears to be doing well in regard to his  right gluteal ulcer. He has been tolerating the dressing changes without complication. Specifically the Wound VAC has been utilized up to this point. Fortunately there's no signs of infection and overall I feel like he has made progress even since last week when I last saw him. I'm actually fairly happy with the overall appearance although he does seem to have somewhat of a hyper granular overgrowth in the central portion of the wound which I think  may require some sharp debridement to try flatness out possibly utilizing chemical cauterization following. 11/23/18 on evaluation today patient actually appears to be doing very well in regard to his sacral ulcer. He seems to be showing signs of improvement with good granulation. With that being said he still has the small area of hyper granulation right in the central portion of the wound which I'm gonna likely utilize silver nitrate on today. Subsequently he also keeps having a leak at the 6 o'clock location which is unfortunate we may be able to help out with some suggestions to try to prevent this going forward. Fortunately there's no signs of active infection at this time. 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 He's been tolerating the dressing changes without complication. Fortunately there's no signs of active infection at this time. I've been rather pleased with how things have progressed there still some evidence of pressure getting to the area with some redness right around the immediate wound opening. Nonetheless other than this I'm not seeing any significant complications or issues the wound is somewhat hyper granular. Upon discussing with the patient and his wife today I'm not sure that the wound is being packed to the base with the foam at this point. And if it's not been packed fully that may be part of the reason why is not seen as much  improvement as far as the granulation from the base out. Again we do not want pack too tightly but we need some of the firm to get to the base of the wound. I discussed this with patient and his wife today. 12/06/18 on evaluation today patient appears to be doing well in regard to his right gluteal pressure ulcer. He's been tolerating the dressing changes without complication. Fortunately there's no signs of active infection. He still has some hyper granular tissue and I do think it would be appropriate to continue with the chemical cauterization as of today. 12/16/18 on evaluation today patient actually appears to be doing okay in regard to his right gluteal ulcer. He is been tolerating the dressing changes without complication including the Wound VAC. Overall I feel like nothing seems to be worsening I do feel like that the hyper granulation buds in the central portion of the wound have improved to some degree with the silver nitrate. We will have to see how things continue to progress. 12/20/18 on evaluation today patient actually appears to be doing much worse in my pinion even compared to last week's evaluation. Unfortunately as opposed to showing any signs of improvement the areas of hyper granular tissue in the central portion of the wound seem to be getting worse. Subsequently the wound bed itself also seems to be getting deeper even compared to last week which is both unusual as well as concerning since prior he had been shown signs of improvement. Nonetheless I think that the issue could be that he's actually having some difficulty in issues with a deeper infection. There's no external signs of infection but nonetheless I am more worried about the internal, osteomyelitis, that could be restarting. He has not been on antibiotics for some time at this point. I think that it may be a good idea to go ahead and started back on an antibiotic therapy while we wait to see what the testing shows. 12/27/18  on evaluation today patient presents for follow-up concerning his left gluteal fold  wound. Fortunately he appears to be doing well today. I did review the CT scan which was negative for any signs of osteomyelitis or acute abnormality this is excellent news. Overall I feel like the surface of the wound bed appears to be doing significantly better today compared to previously noted findings. There does not appear any signs of infection nor does he have any pain at this time. 01/03/19 on evaluation today patient actually appears to be doing quite well in regard to his ulcer. Post debridement last week he really did not have too much bleeding which is good news. Fortunately today this seems to be doing some better but we still has some of the hyper granular tissue noted in the base of the wound which is gonna require sharp debridement today as well. Overall I'm pleased with how things seem to be progressing since we switched away from the Wound VAC I think he is making some progress. 01/10/19 on evaluation today patient appears to be doing better in regard to his right gluteal fold ulcer. He has been tolerating the dressing changes without complication. The debridement to seem to be helping with current away some of the poor hyper granular tissue bugs throughout the region of his gluteal fold wound. He's been tolerating the dressing changes otherwise without complication which is great news. No fevers, chills, nausea, or vomiting noted at this time. 01/17/19 on evaluation today patient actually appears to be doing excellent in regard to his wound. He's been tolerating the dressing changes [name], YBARBO. ([id number]) without complication. Fortunately there is no signs of active infection at this time which is great news. No fevers, chills, nausea, or vomiting noted at this time. 01/24/19 on evaluation today patient actually appears to be doing quite well with regard to his ulcer. He has been tolerating  the dressing changes without complication. Fortunately there's no signs of active infection at this time. Overall been very pleased with the progress that he seems to be making currently. 01/31/19-Patient returns at 1 week with apparent similarity in dimensions to the wound, with no signs of infection, he has been changing dressings twice a day 02/08/19 upon evaluation today patient actually appears to be doing well with regard to his right Ischial ulcer. The wound is not appear to be quite as deep and seems to be making progress which is good news. With that being said I'm still reluctant to go back to the Wound VAC at this point. He's been having to change the dressings twice a day which is a little bit much in my pinion from the wound care supplies standpoint. I think that possibly attempting to utilize extras orbit may be beneficial this may also help to prevent any additional breakdown secondary to fluid retention in the wound itself. The patient is in agreement with giving this a try. 02/15/19 on evaluation today patient actually appears to be doing decently well with regard to his ulcer in the right to gluteal fold location. He's been tolerating the dressing changes without complication. Fortunately there is no signs of active infection at this time. He is able to keep the current dressing in place more effectively for a day at a time whereas before he was having a changes to to three times a day. The actions or has been helpful in this regard. Fortunately there's no signs of anything getting worse and I do feel like he showing signs of good improvement with regard to the wound bed status. 02/22/2019 patient appears to  be doing very well today with regard to his ulcer in the gluteal fold. Fortunately there is no signs of active infection and he has been tolerating the dressing changes without any complication. Overall extremely pleased with how things seem to be progressing. He has much less of  the hyper granular projections within the wound these have slowly been debrided away and he seems to be doing well. The wound bed is more uniform. 03/01/19 on evaluation today patient appears to be doing unfortunately about the same in regard to his gluteal ulcer. He's been tolerating the dressing changes without complication. Fortunately there's no signs of active infection at this time. With that being said he continues to develop these hyper granular projections which I'm unsure of exactly what they are and why they are rising. Nonetheless I explained to the patient that I do believe it would be a good idea for [place] to stand a biopsy sample for pathology to see if that can shed any light on what exactly may be going on here. Fortunately I do not see any obvious signs of infection. With that being said the patient has had a little bit more drainage this week apparently compared to last week. 03/08/2019 on evaluation today patient actually appears to look somewhat better with regard to the appearance of his wound bed at this time. This is good news. Overall I am very pleased with how things seem to have progressed just in the past week with a switch to the [facility] dressing. I think that has been beneficial for him. With that being said at this time the patient is concerned about his biopsy that I sent off last week unfortunately I do not have that report as of yet. Nonetheless we have called to obtain this and hopefully will hear back from the lab later this morning. 03/15/19 on evaluation today patient's wound actually appears to be doing okay today with regard to the overall appearance of the wound bed. He has been tolerating the dressing changes without complication he still has hyper granular tissue noted but fortunately that seems to be minimal at this point compared to some of what we've seen in the past. Nonetheless I do think that he is still having some issues currently with some of  this type of granulation the biopsy and since all showed nothing more than just evidence of granulation tissue. Therefore there really is nothing different to initiate or do at this point. 03/24/19 on evaluation today patient appears to be doing a little better with regard to his ulcer. He's been tolerating the dressing changes without complication. Fortunately there is no signs of active infection at this time. No fevers, chills, nausea, or vomiting noted at this time. I'm overall pleased with how things seem to be progressing. 03/29/2019 on evaluation today patient appears to be doing about the same in regard to his ulcer in the right gluteal fold. Unfortunately he is not seeming to make a lot of progress and the wound is somewhat stalled. There is no signs of active infection externally but I am concerned about the possibility of infection continuing in the ischial location which previously he did have infection noted. Again is not able to have an MRI so probably our best option for testing for this would be a triple phase bone scan which will detect subtle changes in the bone more so than plain film x-rays. In the past they really have not been beneficial and in fact the CT scans even  have been somewhat questionable at times. Nonetheless there is no signs of systemic infection which is at least good news but again his wound is not healing at all on the predicted schedule. 04/05/19 on evaluation today patient appears to be doing well all things considering with regard to his wound and the right gluteal fold. He actually has his triple bone scan scheduled for sometime in the next couple of days. With that being said I've also been looking to other possibilities of what could be causing hyper granular tissue were looking into the bone scan again for evaluating for the risk or possibility of infection deeper and I'm also gonna go ahead and see about obtaining a deep tissue culture today to send and see if  there's any evidence of infection noted on culture. He's in agreement with that plan. 04/12/2019 on evaluation today patient presents for reevaluation here in our clinic concerning ongoing issues with his right gluteal fold ulcer. 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 It was discussed with him at that point that I think it would be appropriate for him to proceed with hyperbaric oxygen therapy especially in light of the fact that he is previously been on IV antibiotics at the beginning of the year for close to 3 months followed by several weeks of oral antibiotics that was all prescribed by infectious disease. He had surgical debridement around Christmas December 2019 due to an abscess and osteomyelitis of the ischial bone. Unfortunately this has not really proceeded as well as we would have liked and again we did a CT scan even a couple months ago as he cannot have an MRI secondary to having issues with both a pain pump as well as a spinal cord stimulator which prevent him from going into an MRI machine. With that being said there were chronic changes noted at the ischial tuberosity which had progressed since December 2019 there was no evidence of fluid collection on that initial CT scan. With that being said that was on January 21, 2019. I am not sure that it was a sensitive enough test as compared to an MRI and then subsequently I ordered a bone scan for him which was actually completed on 03/29/2019 and this revealed that he does indeed have positive osteomyelitis involving the right ischial tuberosity. This is adjacent to the ulcer and I think is the reason that his ulcer is not healing. Subsequently I am in a place him back on oral antibiotics today unfortunately his wound culture showed just mixed gram-negative flora with no specific findings of a predominant organism. Nonetheless  being with the location I think a good broad-spectrum antibiotic for gram-negative's is a good choice at this point he is previously taken Levaquin without significant issues and I think that is an appropriate antibiotic for him at this time. 04/19/2019 on evaluation today patient actually appears to be doing fairly well with regard to his wound. He has been tolerating the dressing changes without complication. Fortunately there is no signs of active infection and he has been taking the oral antibiotics at this time. JAXN, CHIQUITO ([id number]) Subsequently we did make a referral to infectious disease although Dr. Steva Ready wants the patient to be seen by general surgery first in order apparently to see if there is anything from a surgical standpoint that should be done prior to initiation of IV antibiotic therapy. Again  the patient is okay with actually seeing Dr. Celine Ahr whom he has seen before we will make that referral. 04/26/2019 on evaluation today patient actually appears to be doing well with regard to his wound. He has been tolerating the dressing changes without complication. Fortunately there is no signs of active infection at this time. I do believe that the hyperbaric oxygen therapy along with the antibiotics which I prescribed at this point have been doing well for him. With that being said he has not seen Dr. Celine Ahr the surgeon yet in fact they have not been contacted for scheduling an appointment as of yet. Subsequently the patient is not on antibiotics currently by IV Dr. Steva Ready did want him to see Dr. Celine Ahr first which we are working on trying to get scheduled. We again give the information to the patient today for Dr. Celine Ahr and her number as far as contacting their office to see about getting something scheduled. Again were looking for whether or not they recommend any surgical intervention. 05/03/2019 on evaluation today patient appears to be doing about the same with  regard to his wound there may be a little bit of filling in in regard to the base of the wound but still he has quite a bit of hyper granular tissue that I am seeing today. I believe that he may may need a more aggressive sharp debridement possibly even by the surgeon or under anesthesia in order to clear away some of the hyper granular material in order to help allow for appropriate granulation to fill in. We have made a referral to Dr. Celine Ahr unfortunately it sounds as if they did not receive the referral we contacted them today they should be get in touch with the patient. Depending on how things show from that standpoint the patient may need to see Dr. Ardyth Gal who is the infectious disease specialist although he really does not want a PICC line again. No fevers, chills, nausea, vomiting, or diarrhea. He is tolerating hyperbaric oxygen therapy very well. 05/12/2019 on evaluation today patient presents for follow-up visit concerning his right gluteal fold ulcer. He did see Dr. Celine Ahr the surgeon who previously evaluated his wound and she actually felt like he was doing quite well with regard to the wound based on what she was seen. He does seem to be responding to some degree with the oral antibiotics along with hyperbaric oxygen therapy at this point she did not see any evidence or need for further surgical intervention at this point. She recommended deferring additional or ongoing antibiotic therapy to Dr. Steva Ready at her discretion. Fortunately there is no signs of active systemic infection. No fevers, chills, nausea, vomiting, or diarrhea. 10/29; patient I am seeing really for the first time today in terms of his wound. 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 He is being treated with hyperbaric oxygen for underlying osteomyelitis most recently documented by a three-phase bone scan. He has been on Levaquin for roughly a month and he is out of these and asked me for  a refill. Most recent cultures were negative although I do not think these were bone cultures. He has a very irregular surface to this wound which is almost nodular in texture. It undermines superiorly with the same nodular hyper granulated surface. I see that he was referred to general surgery for an operative debridement although the surgeon did not agree with this I think that would have been the proper course of action in  this. He has been using Hydrofera Blue. He is supposed to start a wound VAC tomorrow which is actually a reapplication of wound VAC. I think mostly last time they had trouble keeping the seal in place I hope we have better look at this this time. 05/24/2019 on evaluation today I did review patient's note from Dr. Dellia Nims where he was seen last week when I was out of the office. Subsequently it does appear that Dr. Dellia Nims was in agreement that the patient really needed debridement to clear away some of the nodular tissue in the base of the wound. The good news is he has the wound VAC initiated and some of this tissue is already starting to breakdown under the treatment of the wound VAC again because it is not really viable. Nonetheless I do believe that we can likely perform some debridement today to clear this away and hopefully the continuation of the wound VAC along with hyperbarics and antibiotic therapy will be beneficial in stopping this from reoccurring. If we get better control in the wound bed in a good spot I think we can definitely use the PriMatrix which we actually did get approval for. 05/31/2019 on evaluation today patient appears to be doing actually pretty well at this point with regard to his wound. He has been tolerating the dressing changes which is good news. Fortunately there is no signs of infection. He still has some of the hyper granular nodules noted we have not really cleared all these away and I think we can try to do a little bit more that today. I did  do quite a bit last week I am hoping to get down to the base of the wound so though actually be able to heal more appropriately. I think the wound VAC is doing a good job right now with controlling the drainage and overall the patient is happy with that. 06/07/2019 upon evaluation today patient appears to be doing quite well compared to last week with regard to the hyper granulation in the base of the wound. He has been tolerating the dressing changes without complication. Fortunately there is no signs of active infection at this time. With that being said we have not had any recent measures as far as blood work is concerned I think we may want to check a few things including a CBC, sed rate, and C-reactive protein. Patient is in agreement with that we can try to get that scheduled for him for this Friday. 06/13/2019 on evaluation today patient actually appears to be doing much better at this point based on what I am seeing with regard to his wound. He has much less in the way of hyper granular tissue which is good news and a lot more healing that is taken place since I last saw him as far as the amount of space within the wound itself. With that being said he is nearing the completion of the initial 40 treatments for hyperbaric oxygen therapy I think this is something I would recommend extending as he does seem to be benefiting at this time. Fortunately there is no evidence of active systemic infection at this point nor even local infection for that matter. 06/20/2019 upon evaluation today patient actually appears to be doing well with regard to his wound. In fact this appears to be doing much better today compared to what it was previous. I been extremely pleased with the progress that he is made. In fact the tunnel is much less deep  today than what it was in the past and I am seeing excellent signs of improvement. Overall I am happy with how things are going. I did review his white blood cell count  which revealed everything to be okay. The one abnormal finding on his CBC was a hemoglobin of 12.7. Subsequently I did also review his sed rate and C-reactive protein. His sed rate measured in at normal level measuring at 26. The C-reactive protein however was elevated today and an abnormal range indicating inflammation. Still I feel like he is trending in a good direction at this time. He is going to need a refill of the Levaquin he tells me at this point. 07/04/2019 on evaluation today patient actually appears to be doing well in regard to his wound. He has been tolerating the dressing changes without complication. Fortunately there is no signs of active infection which is good news. No fevers, chills, nausea, vomiting, or diarrhea. The good news also is that I did review his labs and though his sed rate was slightly elevated the C-reactive protein was actually down compared to the last evaluation 2 weeks ago this is excellent news I think it is showing signs of improvement. 07/11/2019 on evaluation today patient actually appears to be doing quite well with regard to his wound on my evaluation. This is very slow going but nonetheless he seems to be making progress. Especially in regard to the depth. Nonetheless I believe that we may want to consider going forward with the PriMatrix which was previously approved for him. We will get a have to get a new approval as we will actually be applying this in the new year so we will likely obtain that approval on January 4. In the meantime and for the time being my suggestion is going to be that we go ahead and continue with the wound VAC and we were going at this point with the current wound care measures. 12/28-Patient seen after HBO session today, wound depth with the tunnel measures 3.2 cm, the wound bed appears healthy, continuing with the VAC and HBO [name], [name] ([id number]) 07/25/2019 on evaluation today patient's wound appears to be doing about  the same compared to my last evaluation. Fortunately there is no evidence of significant infection which is good news we will still waiting on the results of his lab work which unfortunately was not done prior to today's visit which is what we were aiming for. Ultimately there is no sign of active infection at this time which is good news. And he also tells me that the wound VAC has been staying in place and doing fairly well which is also good news. With that being said I am concerned about the fact that the wound has not progressed as much I think we may want to look into getting reapproval for the skin substitute, specifically PriMatrix, that I am hopeful will help to allow this area to granulate in more effectively. I would recommend as well continuing with the wound VAC along with this. 08/01/2019 on evaluation today patient appears to be doing in my opinion slightly better in regard to the wound. He actually does have some tightening of the walls around the edges which is a good thing. Fortunately there is no signs of active infection at this time. No fever chills noted. I do believe that the patient in general seems to be making progress although is very slow. Obviously were trying to get things to speed up somewhat more. No  fevers, chills, nausea, vomiting, or diarrhea. 08/08/2019 upon evaluation today patient appears to be doing well with regard to his wound although there is not a significant improvement overall we do have the PriMatrix for availability today to apply. Fortunately there is no signs of active infection and the patient overall seems to be doing well. There is really no need for aggressive sharp debridement at this point either which is also good news. I do believe her get a need to suture and the primary checks in the basin in the PriMatrix in order to keep it in place at the base of the wound underneath the wound VAC. This was discussed with the patient today as well. He is in  agreement the plan. He really does not have any filling in the area and so this should not be a complication as far as suturing is concerned. 08/15/2019 on evaluation today patient appears to be doing somewhat better in my opinion in regard to the overall appearance of his wound. He has been tolerating the dressing changes without complication. Fortunately the skin subappear to still be in place when this was changed on Friday. With that being said it appears that the PriMatrix may have dissolved in the interim between now and then as the sutures are no longer holding anything in place and again I would sutured it into place. Nonetheless I believe that we can go ahead and likely consider going forward that we may need to apply the PriMatrix weekly as opposed every other week as it does seem to have dissolved rather readily and well. Overall the wound bed appears to be doing excellent at this time. 08/22/2019 upon evaluation today the patient does seem to making some progress with regard to his wound. Overall there is some better quality tissue and less of the hyper granular protrusions all of which is good news. With that being said we do have the PriMatrix available for application today and I am in a try to find a better way to suture this in place for him today. 08/29/2019 on evaluation today patient appears to be doing very well in regard to his wound. I do see signs of new granulation which seems to be somewhat helpful he did have some need for sharp debridement today to clear some of the hyper granulation away but nonetheless I feel like we are headed in a good direction. Fortunately there is no evidence of any systemic infection although we are going to go ahead and reorder the CBC, C-reactive protein, and sed rate today to see where things stand I did just refill his Levaquin as well. 09/05/2019 upon evaluation today patient's wound actually is not appearing to be showing signs of significant  improvement compared to what we have seen previous from the standpoint of depth. He overall wound size wise seems to be about the same and again some of the quality of the wound bed is still doing fairly well. I did review his labs and his CBC was normal with no signs of elevation white blood cell count, his C-reactive protein was normal, and the sed rate was slightly elevated but again I think this may just be due to ulceration really there is nothing significant to indicate severe infection. I think when he likely finishes his current round of oral antibiotics with Levaquin will likely take him off of the medication at that point based on the findings. I do feel like the hyperbarics helped in getting the infection under control as  well as improving the overall size of the wound quite significantly. With that being said at this point I really feel like more more at the time frame that he may benefit from a muscle flap in order to try to help this wound heal effectively. I think that this would be the fastest way in order to do this. In the past he has been somewhat resistant to going down that path but nonetheless I feel like he likely should. 09/12/2019 on evaluation today patient appears to be doing fairly well in regard to his wound. He is tolerating the wound VAC without complication is still continues to drain significantly. Fortunately there is no signs of active infection at this time which is great news his lab work is also been doing very well. With that being said I do think at this point that the patient is still deriving benefit from the wound VAC especially in regard to controlling moisture which I think it is doing fairly well. He also has some improvement in the overall wound bed and on the medial aspect of the wound there are some hyper granular projections but nothing as severe as what they have been in the past. I am good have to perform some sharp debridement at this location  today. 09/19/2019 upon evaluation today patient appears to be doing roughly the same although again the tissue does seem to be showing some signs of improvement which is good news. There is less hyper granular budding. Nonetheless he still is going require some sharp debridement today. We did get his albumin and prealbumin results back and they are within normal limits in both cases which is excellent news that was the one thing [facility] plastic surgery was waiting on order to get him scheduled. He does seem to be a good candidate physically for the flap surgery if he decides to go that direction. With that being said is still left to be determined once they see the wound what they feel about how best to proceed. Obviously in the end it would be up to Mr. Phommachanh as to whether he goes forward with the flap surgery or something else depending on the recommendations. 09/26/2019 upon evaluation today patient appears to be doing about the same in regard to his wound. Fortunately there is no signs of active infection there is also not as much hyper granulation noted today in fact I do not believe there is can be any sharp debridement required at this point. With that being said I do believe he continues to be somewhat stalled at this point with regard to his wound. There is no signs of active infection at this time which is good news. He does have an appointment with [facility] plastic surgery on Friday. 10/03/2019 upon evaluation today patient appears to be doing somewhat poorly especially in his overall demeanor compared to last week's evaluation. He unfortunately did see the physician at [facility] plastic surgery. Unfortunately the plan for the possibility of a flap would be a fairly significant surgery that the patient is really not wanting to take the risk of as if the surgery failed he would end up with a very large open wound at that point. Fortunately there is no signs of active infection at this time. No fevers,  chills, nausea, vomiting, or diarrhea. With that being said there was some question that the physician had about why this would not be healing and the possibility that there may be some bone still needs to be removed  deeper in the wound. Again the bone scan that we did previous actually did show evidence of there still being a chronic osteomyelitis back on March 29, 2019. With that being said he did undergo treatment both with hyperbarics as well as oral antibiotics for quite some time since that point. Obviously he may need additional and updated imaging with the surgeon in order to see if there is anything else that can or will need to be done in order to help this area to heal. Obviously I still believe as well that there is something going on here. He is already seeing the surgeon that performed the original surgery after the bone scan but they did not feel that anything needed to be done from a surgical standpoint at that time. 10/10/2019 upon evaluation today patient appears to be really doing about the same with regard to his wound in the gluteal fold region. ISAMAR, WELLBROCK ([id number]) Unfortunately he is not making a lot of improvement the insurance also contacted home health and apparently the wound VAC is no longer to be covered due to lack of progress. Obviously that is something that we discussed as well as a strong possibility for happening soon and this was even last week that discussed this with him. With that being said again we have tried to manage this wound as best as I could up to this point. Fortunately there is no evidence of active infection at this time. No fevers, chills, nausea, vomiting, or diarrhea. 10/17/2019 upon evaluation today patient has decided to proceed with the flap surgery. He actually seems to be in better spirits today he states he wants to get this healed and after talking to the surgeon last week he feels like that is his best option for doing so. With  that being said he also needs to have a referral to infectious disease to be seen for IV antibiotics preoperatively and postoperatively as far as the skin flap is concerned. He wonders if Dr. Steva Ready would be interested or available to do this for him. I explained that I would definitely have to check with her and I will be happy to do that but I am not certain What her answer will be in 2 I have a discussion with her. 10/24/2019 upon evaluation today patient appears to be doing about the same with regard to his wound although he is complaining of increased pain he is really not had any discomfort he states that it is getting much worse at this point. Fortunately there is no signs of active infection. At least not systemically. With that being said I question whether local infection could be a problem here secondary to the fact that to be honest the patient is having pain where he has not previous. 10/31/2019 upon evaluation today patient appears to be doing about the same in regard to his wound in the right gluteal fold region. Subsequently in the interim since I last saw him last week I did receive a message from Dr. Steva Ready as well as reviewed her note. She was requesting the potential for a bone culture or at least a deep tissue culture in order to see about helping with more directed antibiotic therapy for the patient's projected flap surgery. If we could not obtain more specific organisms for treatment then obviously should be looking at more broad-spectrum antibiotic therapy. With that being said at this point the patient seems ready to move forward with the flap surgery. I did actually sign the  paperwork for his bed which came through last week I believe that was on Thursday or Friday. Either way that should be coming to him as well so he will have that in place once the surgery occurs when he is discharged home. Fortunately there is no signs of active infection at this time. No fevers,  chills, nausea, vomiting, or diarrhea. 11/15/2019 upon evaluation today patient appears to be doing about the same with regard to his right gluteal fold ulcer. He has been tolerating the dressing changes without complication right now he is awaiting the hospital bed with air mattress in order to be able to be scheduled for his flap surgery. He is on IV vancomycin at this point is managed by Dr. Steva Ready. 11/30/2019; right gluteal fold extending over the ischial tuberosity. He had underlying osteomyelitis and there is still managing on vancomycin directed by infectious disease. His hospital bed and level 3 surface are arriving. After that there is plans for a myocutaneous flap at [facility]. They state that he will be in hospital for 2 weeks and for at least 2 weeks in a skilled facility may be longer 12/13/2019 upon evaluation today patient's wound appears to be doing about the same. Dr. Steva Ready actually did come for an evaluation of the patient here in office as well as she cannot really get him up on the bed in order to examine him in her office. Subsequently I did review his well his labs his C-reactive protein and sed rate have been doing excellent he actually appears to be maintaining quite nicely in my opinion. Was still having to pack the wound as it is quite deep and obviously he is going to proceed with the flap surgery but nonetheless I think that he is on the right track in that regard. He is currently still on the IV vancomycin although he does end on this on June 3. His surgery is scheduled for June 15. 12/22/2019 upon evaluation today patient appears to be doing okay with regard to his wound as of today. He is actually just a little over a week out from his surgery date. Subsequently his last day of antibiotic therapy is actually today. Subsequently the PICC line will stay in place over the next week and a half per the surgeon's request. The patient will be proceeding with his flap  surgery on the 15th of this month. 12/29/2019 upon evaluation today patient appears to be doing excellent in regard to his wound all things considered. He actually is having his surgery this coming Tuesday and this is can be a flap surgery. We will not be seeing him back following obviously in that eventuality. Fortunately I do not see any signs of active infection at this time as far as a bacterial infection is concerned. I am concerned however about the possibility of a fungal infection based on what I am seeing today. I discussed this with the patient and his wife during the office visit today. Objective Constitutional Well-nourished and well-hydrated in no acute distress. Vitals Time Taken: 8:20 AM, Height: 73 in, Weight: 210 lbs, BMI: 27.7, Temperature: 98.3 F, Pulse: 71 bpm, Respiratory Rate: 16 breaths/min, Blood Pressure: 133/78 mmHg. Respiratory normal breathing without difficulty. Psychiatric this patient is able to make decisions and demonstrates good insight into disease process. Alert and Oriented x 3. pleasant and cooperative. General Notes: Patient's wound actually appears to be doing fairly well which is great news there is no evidence of infection. With that being said I  do feel like the periwound shows some evidence of a fungal infection and I would like to try to help take care of that prior to his surgery. For that reason I am going to give him a prescription for Diflucan I am good also given a prescription for nystatin powder to use around the area for now. Integumentary (Hair, Skin) Wound #1 status is Open. Original cause of wound was Pressure Injury. The wound is located on the Right Gluteal fold. The wound measures 4cm Mccole, Tamas J. ([id number]) length x 1.2cm width x 2.4cm depth; 3.77cm^2 area and 9.048cm^3 volume. There is Fat Layer (Subcutaneous Tissue) Exposed exposed. There is a large amount of serous drainage noted. The wound margin is epibole. There is large  (67-100%) pink, hyper - granulation within the wound bed. There is a small (1-33%) amount of necrotic tissue within the wound bed including Adherent Slough. 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 - in office Cleanse wound with mild soap and water Anesthetic (add to Medication List): Wound #1 Right Gluteal fold: Topical Lidocaine 4% cream applied to wound bed prior to debridement (In Clinic Only). Skin Barriers/Peri-Wound Care: Skin Prep Antifungal powder-Nystatin Primary Wound Dressing: Wound #1 Right Gluteal fold: Silver Alginate Secondary Dressing: Wound #1 Right Gluteal fold: Tegaderm XtraSorb Dressing Change Frequency: Wound #1 Right Gluteal fold: Change dressing every other day. Follow-up Appointments: Wound #1 Right Gluteal fold: Other: - as needed Off-Loading: Wound #1 Right Gluteal fold: Turn and reposition every 2 hours - no pressure on wounded area Home Health: Wound #1 Right Gluteal fold: Continue Home Health Visits - [place] Nurse may visit PRN to address patient s wound care needs. FACE TO FACE ENCOUNTER: MEDICARE and MEDICAID PATIENTS: I certify that this patient is under my care and that I had a face-to-face encounter that meets the physician face-to-face encounter requirements with this patient on this date. The encounter with the patient was in whole or in part for the following MEDICAL CONDITION: (primary reason for [place]) MEDICAL NECESSITY: I certify, that based on my findings, NURSING services are a medically necessary home health service. HOME BOUND STATUS: I certify that my clinical findings support that this patient is  homebound (i.e., Due to illness or injury, pt requires aid of supportive devices such as crutches, cane, wheelchairs, walkers, the use of special transportation or the assistance of another person to leave their place of residence. There is a normal inability to leave the home and doing so requires considerable and taxing effort. Other absences are for medical reasons / religious services and are infrequent or of short duration when for other reasons). If current dressing causes regression in wound condition, may D/C ordered dressing product/s and apply Normal Saline Moist Dressing daily until next [place] / Other MD appointment. [place] of regression in wound condition at [phone]. Please direct any NON-WOUND related issues/requests for orders to patient's Primary Care Physician The following medication(s) was prescribed: Diflucan oral 150 mg tablet 1 1 tablet oral taken 1 times today starting 12/29/2019 nystatin topical 100,000 unit/gram powder powder topical applied to the area surrounding the wound which is red and inflamed with each dressing change as directed starting 12/29/2019 1. I would recommend currently that we initiate  treatment with nystatin powder as well as Diflucan so that we can try to knock out the fungal infection around the wound area. I think that this needs to be done next soon as possible in order to prepare him for his surgery on Tuesday. LYNK, MARTI ([id number]) 2. I am also can recommend that we continue with the wound care orders as before utilizing the silver alginate dressing to pack with. 3. I would also recommend that we continue with the cover dressing for drainage control at this point. She does like to put nystatin powder in the extremely erythematous areas however prior to applying the dressing. Follow-up as needed as the patient is having flap surgery on Tuesday. Electronic Signature(s) Signed: 12/29/2019 9:19:13 AM By:  Worthy Keeler [name] Entered By: Worthy Keeler on 12/29/2019 09:19:12 ANTIONO, ETTINGER ([id number]) -------------------------------------------------------------------------------- SuperBill Details Patient Name: [name] Date of Service: 12/29/2019 Medical Record Number: [id number] Patient Account Number: [id number] Date of Birth/Sex: [birth year] (69 [name].o. 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, sequela I10 Essential (primary) hypertension Facility Procedures CPT4 Code: 08657846 Description: 99213 - WOUND CARE VISIT-LEV 3 EST PT Modifier: Quantity: 1 Physician Procedures CPT4 Code: 9629528 Description: 41324 - WC PHYS LEVEL 4 - EST PT Modifier: Quantity: 1 CPT4 Code: Description: ICD-10 Diagnosis Description L89.314 Pressure ulcer of right buttock, 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

## 2019-12-30 NOTE — Progress Notes (Addendum)
RANEY, KOEPPEN ([id number]) Visit Report for 12/29/2019 Arrival Information Details Patient Name: [name], [name] Date of Service: 12/29/2019 8:15 AM Medical Record Number: [id number] Patient Account Number: [id number] Date of Birth/Sex: 04-08-1951 (69 y.o. M) Treating RN: Army Melia Primary Care Terrence Wishon: [name] Other Clinician: Referring Malcolm Hetz: [name] Treating Besnik Febus/Extender: [name], HOYT Weeks in Treatment: 75 Visit Information History Since Last Visit Added or deleted any medications: No Patient Arrived: Wheel Chair Any new allergies or adverse reactions: No Arrival Time: 08:23 Had a fall or experienced change in No Accompanied By: wife activities of daily living that may affect Transfer Assistance: None risk of falls: Patient Identification Verified: Yes Signs or symptoms of abuse/neglect since last visito No Secondary Verification Process Completed: Yes Hospitalized since last visit: No Patient Requires Transmission-Based Precautions: No Implantable device outside of the clinic excluding No Patient Has Alerts: Yes cellular tissue based products placed in the center Patient Alerts: NOT Diabetic since last visit: Has Dressing in Place as Prescribed: Yes Pain Present Now: Yes Electronic Signature(s) Signed: 12/29/2019 11:39:41 AM By: Lorine Bears RCP, RRT, CHT Entered By: Lorine Bears on 12/29/2019 08:24:19 [name] ([id number]) -------------------------------------------------------------------------------- Clinic Level of Care Assessment Details Patient Name: [name] Date of Service: 12/29/2019 8:15 AM Medical Record Number: [id number] Patient Account Number: [id number] Date of Birth/Sex: [birth year] (69 y.o. M) Treating RN: Army Melia Primary Care Vickye Astorino: [name] Other Clinician: Referring Ezrah Panning: [name] Treating Donell Tomkins/Extender: [name],  HOYT Weeks in Treatment: 108 Clinic Level of Care Assessment Items TOOL 4 Quantity Score []  - Use when only an EandM is performed on FOLLOW-UP visit 0 ASSESSMENTS - Nursing Assessment / Reassessment X - Reassessment of Co-morbidities (includes updates in patient status) 1 10 X- 1 5 Reassessment of Adherence to Treatment Plan ASSESSMENTS - Wound and Skin Assessment / Reassessment X - Simple Wound Assessment / Reassessment - one wound 1 5 []  - 0 Complex Wound Assessment / Reassessment - multiple wounds []  - 0 Dermatologic / Skin Assessment (not related to wound area) ASSESSMENTS - Focused Assessment []  - Circumferential Edema Measurements - multi extremities 0 []  - 0 Nutritional Assessment / Counseling / Intervention []  - 0 Lower Extremity Assessment (monofilament, tuning fork, pulses) []  - 0 Peripheral Arterial Disease Assessment (using hand held doppler) ASSESSMENTS - Ostomy and/or Continence Assessment and Care []  - Incontinence Assessment and Management 0 []  - 0 Ostomy Care Assessment and Management (repouching, etc.) PROCESS - Coordination of Care X - Simple Patient / Family Education for ongoing care 1 15 []  - 0 Complex (extensive) Patient / Family Education for ongoing care []  - 0 Staff obtains Programmer, systems, Records, Test Results / Process Orders []  - 0 Staff telephones HHA, Nursing Homes / Clarify orders / etc []  - 0 Routine Transfer to another Facility (non-emergent condition) []  - 0 Routine Hospital Admission (non-emergent condition) []  - 0 New Admissions / Biomedical engineer / Ordering NPWT, Apligraf, etc. []  - 0 Emergency Hospital Admission (emergent condition) X- 1 10 Simple Discharge Coordination []  - 0 Complex (extensive) Discharge Coordination PROCESS - Special Needs []  - Pediatric / Minor Patient Management 0 []  - 0 Isolation Patient Management []  - 0 Hearing / Language / Visual special needs []  - 0 Assessment of Community assistance  (transportation, D/C planning, etc.) []  - 0 Additional assistance / Altered mentation []  - 0 Support Surface(s) Assessment (bed, cushion, seat, etc.) INTERVENTIONS - Wound Cleansing / Measurement Zender, Javi J. ([id number]) X- 1 5 Simple Wound  Cleansing - one wound []  - 0 Complex Wound Cleansing - multiple wounds X- 1 5 Wound Imaging (photographs - any number of wounds) []  - 0 Wound Tracing (instead of photographs) X- 1 5 Simple Wound Measurement - one wound []  - 0 Complex Wound Measurement - multiple wounds INTERVENTIONS - Wound Dressings []  - Small Wound Dressing one or multiple wounds 0 X- 1 15 Medium Wound Dressing one or multiple wounds []  - 0 Large Wound Dressing one or multiple wounds []  - 0 Application of Medications - topical []  - 0 Application of Medications - injection INTERVENTIONS - Miscellaneous []  - External ear exam 0 []  - 0 Specimen Collection (cultures, biopsies, blood, body fluids, etc.) []  - 0 Specimen(s) / Culture(s) sent or taken to Lab for analysis []  - 0 Patient Transfer (multiple staff / Civil Service fast streamer / Similar devices) []  - 0 Simple Staple / Suture removal (25 or less) []  - 0 Complex Staple / Suture removal (26 or more) []  - 0 Hypo / Hyperglycemic Management (close monitor of Blood Glucose) []  - 0 Ankle / Brachial Index (ABI) - do not check if billed separately X- 1 5 Vital Signs Has the patient been seen at the hospital within the last three years: Yes Total Score: 80 Level Of Care: New/Established - Level 3 Electronic Signature(s) Signed: 12/29/2019 11:13:05 AM By: Army Melia Entered By: Army Melia on 12/29/2019 08:54:16 [name] ([id number]) -------------------------------------------------------------------------------- Encounter Discharge Information Details Patient Name: [name] Date of Service: 12/29/2019 8:15 AM Medical Record Number: [id number] Patient Account Number: [id number] Date of  Birth/Sex: [birth year] (69 y.o. M) Treating RN: Army Melia Primary Care Jaleeyah Munce: [name] Other Clinician: Referring Aigner Horseman: [name] Treating Petrona Wyeth/Extender: [name], HOYT Weeks in Treatment: 108 Encounter Discharge Information Items Discharge Condition: Stable Ambulatory Status: Wheelchair Discharge Destination: Home Transportation: Private Auto Accompanied By: wife Schedule Follow-up Appointment: Yes Clinical Summary of Care: Electronic Signature(s) Signed: 12/29/2019 11:13:05 AM By: Army Melia Entered By: Army Melia on 12/29/2019 08:55:15 [name] ([id number]) -------------------------------------------------------------------------------- Lower Extremity Assessment Details Patient Name: [name] Date of Service: 12/29/2019 8:15 AM Medical Record Number: [id number] Patient Account Number: [id number] Date of Birth/Sex: 1951-05-01 (69 y.o. M) Treating RN: Montey Hora Primary Care Jacci Ruberg: [name] Other Clinician: Referring Geneva Barrero: [name] Treating Judeth Gilles/Extender: [name], HOYT Weeks in Treatment: 108 Electronic Signature(s) Signed: 12/29/2019 2:59:46 PM By: Montey Hora Entered By: Montey Hora on 12/29/2019 08:37:09 EFOSA, TREICHLER ([id number]) -------------------------------------------------------------------------------- Multi Wound Chart Details Patient Name: [name] Date of Service: 12/29/2019 8:15 AM Medical Record Number: [id number] Patient Account Number: [id number] Date of Birth/Sex: 09/19/1950 (69 y.o. M) Treating RN: Army Melia Primary Care Marika Mahaffy: [name] Other Clinician: Referring Brinna Divelbiss: [name] Treating Shavonda Wiedman/Extender: [name], HOYT Weeks in Treatment: 108 Vital Signs Height(in): 73 Pulse(bpm): 71 Weight(lbs): 210 Blood Pressure(mmHg): 133/78 Body Mass Index(BMI): 28 Temperature(F): 98.3 Respiratory Rate(breaths/min):  16 Photos: [N/A:N/A] Wound Location: Right Gluteal fold N/A N/A Wounding Event: Pressure Injury N/A N/A Primary Etiology: Pressure Ulcer N/A N/A Comorbid History: Cataracts, Hypertension, Paraplegia N/A N/A Date Acquired: 11/02/2017 N/A N/A Weeks of Treatment: 108 N/A N/A Wound Status: Open N/A N/A Measurements L x W x D (cm) 4x1.2x2.4 N/A N/A Area (cm) : 3.77 N/A N/A Volume (cm) : 9.048 N/A N/A % Reduction in Area: 62.50% N/A N/A % Reduction in Volume: -800.30% N/A N/A Classification: Category/Stage IV N/A N/A Exudate Amount: Large N/A N/A Exudate Type: Serous N/A N/A Exudate Color: amber N/A N/A Wound Margin:  Epibole N/A N/A Granulation Amount: Large (67-100%) N/A N/A Granulation Quality: Pink, Hyper-granulation N/A N/A Necrotic Amount: Small (1-33%) N/A N/A Exposed Structures: Fat Layer (Subcutaneous Tissue) N/A N/A Exposed: Yes Fascia: No Tendon: No Muscle: No Joint: No Bone: No Epithelialization: Small (1-33%) N/A N/A Treatment Notes Electronic Signature(s) Signed: 12/29/2019 11:13:05 AM By: Army Melia Entered By: Army Melia on 12/29/2019 08:48:38 CHARLIS, HARNER ([id number]) -------------------------------------------------------------------------------- [place] Details Patient Name: [name] Date of Service: 12/29/2019 8:15 AM Medical Record Number: [id number] Patient Account Number: [id number] Date of Birth/Sex: 10-Feb-1951 (69 y.o. M) Treating RN: Army Melia Primary Care Saige Canton: [name] Other Clinician: Referring Samaa Ueda: [name] Treating Roberta Angell/Extender: Worthy Keeler Weeks in Treatment: 108 Active Inactive Electronic Signature(s) Signed: 01/02/2020 2:17:51 PM By: Gretta Cool, BSN, RN, CWS, Kim RN, BSN Signed: 01/04/2020 1:17:24 PM By: Army Melia Previous Signature: 12/29/2019 11:13:05 AM Version By: Army Melia Entered By: Gretta Cool BSN, RN, CWS, Kim on 01/02/2020 14:17:50 TAYVIN, PRESLAR  ([id number]) -------------------------------------------------------------------------------- Pain Assessment Details Patient Name: [name] Date of Service: 12/29/2019 8:15 AM Medical Record Number: [id number] Patient Account Number: [id number] Date of Birth/Sex: July 01, 1951 (69 y.o. M) Treating RN: Montey Hora Primary Care Danamarie Minami: [name] Other Clinician: Referring Ondria Oswald: [name] Treating Kaysin Brock/Extender: [name], HOYT Weeks in Treatment: 108 Active Problems Location of Pain Severity and Description of Pain Patient Has Paino Yes Site Locations Pain Location: Pain in Ulcers With Dressing Change: Yes Duration of the Pain. Constant / Intermittento Constant Pain Management and Medication Current Pain Management: Electronic Signature(s) Signed: 12/29/2019 2:59:46 PM By: Montey Hora Entered By: Montey Hora on 12/29/2019 08:37:00 MILAS, SCHAPPELL ([id number]) -------------------------------------------------------------------------------- Patient/Caregiver Education Details Patient Name: [name] Date of Service: 12/29/2019 8:15 AM Medical Record Number: [id number] Patient Account Number: [id number] Date of Birth/Gender: January 30, 1951 (69 y.o. M) Treating RN: Army Melia Primary Care Physician: [name] Other Clinician: Referring Physician: [name] Treating Physician/Extender: Sharalyn Ink in Treatment: 108 Education Assessment Education Provided To: Patient Education Topics Provided Wound/Skin Impairment: Handouts: Caring for Your Ulcer Methods: Demonstration, Explain/Verbal Responses: State content correctly Electronic Signature(s) Signed: 12/29/2019 11:13:05 AM By: Army Melia Entered By: Army Melia on 12/29/2019 08:54:36 Dross, [name] ([id number]) -------------------------------------------------------------------------------- Wound Assessment Details Patient Name: [name] Date of Service: 12/29/2019 8:15 AM Medical Record Number: [id number] Patient Account Number: [id number] Date of Birth/Sex: [birth year] (69 y.o. M) Treating RN: Montey Hora Primary Care Shonn Farruggia: [name] Other Clinician: Referring Catherin Doorn: [name] Treating Dewight Catino/Extender: [name], HOYT Weeks in Treatment: 108 Wound Status Wound Number: 1 Primary Etiology: Pressure Ulcer Wound Location: Right Gluteal fold Wound Status: Open Wounding Event: Pressure Injury Comorbid History: Cataracts, Hypertension, Paraplegia Date Acquired: 11/02/2017 Weeks Of Treatment: 108 Clustered Wound: No Photos Wound Measurements Length: (cm) 4 Width: (cm) 1.2 Depth: (cm) 2.4 Area: (cm) 3.77 Volume: (cm) 9.048 % Reduction in Area: 62.5% % Reduction in Volume: -800.3% Epithelialization: Small (1-33%) Wound Description Classification: Category/Stage IV Wound Margin: Epibole Exudate Amount: Large Exudate Type: Serous Exudate Color: amber Foul Odor After Cleansing: No Slough/Fibrino Yes Wound Bed Granulation Amount: Large (67-100%) Exposed Structure Granulation Quality: Pink, Hyper-granulation Fascia Exposed: No Necrotic Amount: Small (1-33%) Fat Layer (Subcutaneous Tissue) Exposed: Yes Necrotic Quality: Adherent Slough Tendon Exposed: No Muscle Exposed: No Joint Exposed: No Bone Exposed: No Electronic Signature(s) Signed: 12/29/2019 2:59:46 PM By: Montey Hora Entered By: Montey Hora on 12/29/2019 08:44:08 [name] ([id number]) -------------------------------------------------------------------------------- Vitals Details Patient Name: [name] Date of Service: 12/29/2019  8:15 AM Medical Record Number: [id number] Patient Account Number: [id number] Date of Birth/Sex: Oct 26, 1950 (69 y.o. M) Treating RN: Army Melia Primary Care Kasara Schomer: [name] Other Clinician: Referring Cristyn Crossno: [name] Treating  Agripina Guyette/Extender: [name], HOYT Weeks in Treatment: 108 Vital Signs Time Taken: 08:20 Temperature (F): 98.3 Height (in): 73 Pulse (bpm): 71 Weight (lbs): 210 Respiratory Rate (breaths/min): 16 Body Mass Index (BMI): 27.7 Blood Pressure (mmHg): 133/78 Reference Range: 80 - 120 mg / dl Electronic Signature(s) Signed: 12/29/2019 11:39:41 AM By: Lorine Bears RCP, RRT, CHT Entered By: Becky Sax, Amado Nash on 12/29/2019 08:27:29

## 2020-01-03 ENCOUNTER — Telehealth: Payer: Self-pay

## 2020-01-03 NOTE — Telephone Encounter (Signed)
[facility] called as patient was in surgery to verify that he did finish his last Abx. He will come to [place] after and they will call to discuss OPAT plans at Discharge.

## 2020-01-17 NOTE — Progress Notes (Signed)
AMOUR, CUTRONE ([id number]) Visit Report for 11/30/2019 HPI Details Patient Name: [name] Date of Service: 11/30/2019 8:00 AM Medical Record Number: [id number] Patient Account Number: [id number] Date of Birth/Sex: [birth year] (69 y.o. 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 He states that there is no history of diabetes that he is aware of. He does have issues with his prostate and is currently receiving treatment for this by way of oral medication. With that being said I do not have a lot of details in that regard. Nonetheless the patient presents today as a result of having been referred to [place] by another provider initially home health was set to come out and take care of his wound although due to the fact that he apparently drives he's not able to receive home health. His wife is therefore trying to help take care of this wound within although they have been struggling with what exactly to do at this point. She states that she can do some things but she is definitely not a nurse and does have some issues with looking at blood. The good news is the wound does not appear to be too deep and is fairly superficial at this point. There is no slough noted there is some nonviable skin noted around the surface of the wound and the perimeter at this point. The central portion of the wound appears to be very good with a dermal layer noted this does not appear to be again deep enough to extend it to subcutaneous tissue at this point. Overall  the patient for a paraplegic seems to be functioning fairly well he does have both a spinal cord stimulator as well is the intrathecal pump. In the pump he has Dilaudid and baclofen. 12/07/17 on evaluation today patient presents for follow-up concerning his ongoing lower back thigh ulcer on the right. He states that he did not get the supplies ordered and therefore has not really been able to perform the dressing changes as directed exactly. His wife was able to get some Boarder Foam Dressing's from the drugstore and subsequently has been using hydrogel which did help to a degree in the wound does appear to be able smaller. There is actually more drainage this week noted than previous. 12/21/17 on evaluation today patient appears to be doing rather well in regard to his right gluteal ulcer. He has been tolerating the dressing changes without complication. There does not appear to be any evidence of infection at this point in time. Overall the wound does seem to be making some progress as far as the edges are concerned there's not as much in the way of overlapping of the external wound edges and he has a good epithelium to wound bed border for the most part. This however is not true right at the 12 o'clock location over the span of a little over a centimeters which actually will require debridement today to clean this away and hopefully allow it to continue to heal more appropriately. 12/28/17 on evaluation today patient appears to be doing rather well in regard to his ulcer in the  left gluteal region. He's been tolerating the dressing changes without complication. Apparently he has had some difficulty getting his dressing material. Apparently there's been some confusion with ordering we're gonna check into this. Nonetheless overall he's been showing signs of improvement which is good news. Debridement is not required today. 01/04/18 on evaluation today patient presents for follow-up concerning his right  gluteal ulcer. He has been tolerating the dressing changes fairly well. On inspection today it appears he may actually have some maceration them concerned about the fact that he may be developing too much moisture in and around the wound bed which can cause delay in healing. With that being said he unfortunately really has not showed significant signs of improvement since last week's evaluation in fact this may even be just the little bit/slightly larger. Nonetheless he's been having a lot of discomfort I'm not sure this is even related to the wound as he has no pain when I'm to breeding or otherwise cleaning the wound during evaluation today. Nonetheless this is something that we did recommend he talked to his pain specialist concerning. 01/11/18 on evaluation today patient appears to be doing better in regard to his ulceration. He has been tolerating the dressing changes without complication. With that being said overall there's no evidence of infection which is good news. The only thing is he did receive the hatch affair blue classic versus the ready nonetheless I feel like this is perfectly fine and appears to have done well for him over the past week. 01/25/18 on evaluation today patient's wound actually appears to be a little bit larger than during the last evaluation. The good news is the majority of the wound edges actually appear to be fairly firmly attached to the wound bed unfortunately again we're not really making progress in regard to the size. Roughly the wound is about the same size as when I first saw him although again the wound margin/edges appear to be much better. 02/01/18 on evaluation today patient actually appears to be doing very well in regard to his wound. Applying the Prisma dry does seem to be better although he does still have issues with slow progression of the wound. There was a slight improvement compared to last week's measurements today. Nonetheless I have been  considering other options as far as the possibility of Theraskin or even a snap vac. In general I'm not sure that the Theraskin due to location of the wound would be a very good idea. Nonetheless I do think that a snap vac could be a possibility for the patient and in fact I think this could even be an excellent way to manage the wound possibly seeing some improvement in a very rapid fashion here. Nonetheless this is something that we would need to get approved and I did have a lengthy conversation with the patient about this today. 02/08/18 on evaluation today patient appears to be doing a little better in regard to his ulcer. He has been tolerating the dressing changes without complication. Fortunately despite the fact that the wound is a little bit smaller it's not significantly so unfortunately. We have discussed the possibility of a snap vac we did check with insurance this is actually covered at this point. Fortunately there does not appear to be any sign of infection. Overall I'm fairly pleased with how things seem to be appearing at this point. 02/15/18 on evaluation today patient appears to be doing rather well in regard to his right gluteal ulcer. Unfortunately the  snap vac did not stay in place with his sheer and friction this came loose and did not seem to maintain seal very well. He worked for about two days and it did seem to do very well during that time according to his wife but in general this does not seem to be something that's gonna be beneficial for him long-term. I do believe we need to go back to standard dressings to see if we can find something that will be of benefit. YAKOV, BERGEN ([id number]) 03/02/18- He is here in follow up evaluation; there is minimal change in the wound. He will continue with the same treatment plan, would consider changing to iodosrob/iodoflex if ulcer continues to to plateau. He will follow up next week 03/08/18 on evaluation today patient's wound  actually appears to be about the same size as when I previously saw him several weeks back. Unfortunately he does have some slightly dark discoloration in the central portion of the wound which has me concerned about pressure injury. I do believe he may be sitting for too long a period of time in fact he tells me that "I probably sit for much too long". He does have some Slough noted on the surface of the wound and again as far as the size of the wound is concerned I'm really not seeing anything that seems to have improved significantly. 03/15/18 on evaluation today patient appears to be doing fairly well in regard to his ulcer. The wound measured pretty much about the same today compared to last week's evaluation when looking at his graph. With that being said the area of bruising/deep tissue injury that was noted last week I do not see at this point. He did get a new cushion fortunately this does seem to be have been of benefit in my pinion. It does appear that he's been off of this more which is good news as well I think that is definitely showing in the overall wound measurements. With that being said I do believe that he needs to continue to offload I don't think that the fact this is doing better should be or is going to allow him to not have to offload and explain this to him as well. Overall he seems to be in agreement the plan I think he understands. The overall appearance of the wound bed is improved compared to last week I think the Iodoflex has been beneficial in that regard. 03/29/18 on evaluation today patient actually appears to be doing rather well in regard to his wound from the overall appearance standpoint he does have some granulation although there's some Slough on the surface of the wound noted as well. With that being said he unfortunately has not improved in regard to the overall measurement of the wound in volume or in size. I did have a discussion with him very specifically  about offloading today. He actually does work although he mainly is just sitting throughout the day. He tells me he offloads by "lifting himself up for 30 seconds off of his chair occasionally" purchase from [employer] which does seem to have helped. And he has a new cushion that he with that being said he's also able to stand some for a very short period of time but not significant enough I think to provide appropriate offloading. I think the biggest issue at this point with the wound and the fact is not healing as quickly as we would like is due to the fact that  he is really not able to appropriately offload while at work. He states the beginning after his injury he actually had a bed at his job that he could lay on in order to offload and that does seem to have been of help back at that time. Nonetheless he had not done this in quite some time unfortunately. I think that could be helpful for him this is something I would like for him to look into. 04/05/18 on evaluation today patient actually presents for follow-up concerning his right gluteal ulcer. Again he really is not significantly improved even compared to last week. He has been tolerating the dressing changes without complication. With that being said fortunately there appears to be no evidence of infection at this time. He has been more proactive in trying to offload. 04/12/18 on evaluation today patient actually appears to be doing a little better in regard to his wound and the right gluteal fold region. He's been tolerating the dressing changes since removing the oasis without complication. However he was having a lot of burning initially with the oasis in place. He's unsure of exactly why this was given so much discomfort but he assumes that it was the oasis itself causing the problem. Nonetheless this had to be removed after about three days in place although even those three days seem to have made a fairly good improvement in  regard to the overall appearance of the wound bed. In fact is the first time that he's made any improvement from the standpoint of measurements in about six weeks. He continues to have no discomfort over the area of the wound itself which leads me to wonder why he was having the burning with the oasis when he does not even feel the actual debridement's themselves. I am somewhat perplexed by this. 04/19/18 on evaluation today patient's wound actually appears to be showing signs of epithelialization around the edge of the wound and in general actually appears to be doing better which is good news. He did have the same burning after about three days with applying the Endoform last week in the same fashion that I would generally apply a skin substitute. This seems to indicate that it's not the oasis to cause the problem but potentially the moisture buildup that just causes things to burn or there may be some other reaction with the skin prep or Steri-Strips. Nonetheless I'm not sure that is gonna be able to tolerate any skin substitute for a long period of time. The good news is the wound actually appears to be doing better today compared to last week and does seem to finally be making some progress. 04/26/18 on evaluation today patient actually appears to be doing rather well in regard to his ulcer in the right gluteal fold. He has been tolerating the dressing changes without complication which is good news. The Endoform does seem to be helping although he was a little bit more macerated this week. This seems to be an ongoing issue with fluid control at this point. Nonetheless I think we may be able to add something like Drawtex to help control the drainage. 05/03/18 on evaluation today patient appears to actually be doing better in regard to the overall appearance of his wound. He has been tolerating the dressing changes without complication. Fortunately there appears to be no evidence of infection at  this time. I really feel like his wound has shown signs as of today of turning around last week I thought so as well  and definitely he could be seen in this week's overall appearance and measurements. In general I'm very pleased with the fact that he finally seems to be making a steady but sure progress. The patient likewise is very pleased. 05/17/18 on evaluation today patient appears to be doing more poorly unfortunately in regard to his ulcer. He has been tolerating the dressing changes without complication. With that being said he tells me that in the past couple of days he and his wife have noticed that we did not seem to be doing quite as well is getting dark near the center. Subsequently upon evaluation today the wound actually does appear to be doing worse compared to previous. He has been tolerating the dressing changes otherwise and he states that he is not been sitting up anymore than he was in the past from what he tells me. Still he has continued to work he states "I'm tired of dealing with this and if I have to just go home and lay in the bed all the time that's what I'll do". Nonetheless I am concerned about the fact that this wound does appear to be deeper than what it was previous. 05/24/18 upon evaluation today patient actually presents after having been in the hospital due to what was presumed to be sepsis secondary to the wound infection. He had an elevated white blood cell count between 14 and 15. With that being said he does seem to be doing somewhat better now. His wound still is giving him some trouble nonetheless and he is obviously concerned about the fact likely talked about that this does seem to go more deeply than previously noted. I did review his wound culture which showed evidence of Staphylococcus aureus him and group B strep. Nonetheless he is on antibiotics, Levaquin, for this. Subsequently I did review his intake summary from the hospital as well. I also did  look at the CT of the lumbar spine with contrast that was performed which showed no bone destruction to suggest lumbar disguises/osteomyelitis or sacral osteomyelitis. There was no paraspinal abscess. Nonetheless it appears this may have been more of just a soft tissue infection at this point which is good news. He still is nonetheless concerned about the wound which again I think is completely reasonable considering everything he's been through recently. 05/31/18 on evaluation today on evaluation today patient actually appears to be showing signs of his wound be a little bit deeper than what I would like to see. Fortunately he does not show any signs of significant infection although his temperature was 99 today he states he's been checking this at home and has not been elevated. Nonetheless with the undermining that I'm seeing at this point I am becoming more concerned GARISON, GENOVA. ([id number]) about the wound I do think that offloading is a key factor here that is preventing the speedy recovery at this point. There does not appear to be any evidence of again over infection noted. He's been using Santyl currently. 06/07/18 the patient presents today for follow-up evaluation regarding the left ulcer in the gluteal region. He has been tolerating the Wound VAC fairly well. He is obviously very frustrated with this he states that to mean is really getting in his way. There does not appear to be any evidence of infection at this time he does have a little bit of odor I do not necessarily associate this with infection just something that we sometimes notice with Wound VAC therapy. With that being said  I can definitely catch a tone of discontentment overall in the patient's demeanor today. This when he was previously in the hospital an CT scan was done of the lumbar region which did not reveal any signs of osteomyelitis. With that being said the pelvis in particular was not evaluated distinctly  which means he could still have some osteonecrosis I. Nonetheless the Wound VAC was started on Thursday I do want to get this little bit more time before jumping to a CT scan of the pelvis although that is something that I might would recommend if were not see an improvement by that time. 06/14/18 on evaluation today patient actually appears to be doing about the same in regard to his right gluteal ulcer. Again he did have a CT scan of the lumbar spine unfortunately this did not include the pelvis. Nonetheless with the depth of the wound that I'm seeing today even despite the fact that I'm not seeing any evidence of overt cellulitis I believe there's a good chance that we may be dealing with osteomyelitis somewhere in the right Ischial region. No fevers, chills, nausea, or vomiting noted at this time. 06/21/18 on evaluation today patient actually appears to be doing about the same with regard to his wound. The tunnel at 6 o'clock really does not appear to be any deeper although it is a little bit wider. I think at this point you may want to start packing this with white phone. Unfortunately I have not got approval for the CT scan of the pelvis as of yet due to the fact that Medicare apparently has been denied it due to the diagnosis codes not being appropriate according to Medicare for the test requested. With that being said the patient cannot have an MRI and therefore this is the only option that we have as far as testing is concerned. The patient has had infection and was on antibiotics and been added code for cellulitis of the bottom to see if this will be appropriate for getting the test approved. Nonetheless I'm concerned about the infection have been spread deeper into the Ischial region. 06/28/18 on evaluation today patient actually appears to be doing rather well all things considered in regard to the right gluteal ulcer. He has been tolerating the dressing changes without complication. With  that being said the Wound VAC he states does have to be replaced almost every day or at least reinforced unfortunately. Patient actually has his CT scan later this morning we should have the results by tomorrow. 07/05/18 on evaluation today patient presents for follow-up concerning his right Ischial ulcer. He did see the surgeon Dr. Lysle Pearl last week. They were actually very happy with him and felt like he spent a tremendous amount of time with them as far as discussing his situation was concerned. In the end Dr. Lysle Pearl did contact me as well and determine that he would not recommend any surgical intervention at this point as he felt like it would not be in the patient's best interest based on what he was seeing. He recommended a referral to infectious disease. Subsequently this is something that Dr. Ines Bloomer office is working on setting up for the patient. As far as evaluation today is concerned the patient's wound actually appears to be worse at this point. I am concerned about how things are progressing and specifically about infection. I do not feel like it's the deeper but the area of depth is definitely widening which does have me concerned. No fevers, chills,  nausea, or vomiting noted at this time. I think that we do need initiate antibiotic therapy the patient has an allow allergy to amoxicillin/penicillin he states that he gets a rash since childhood. Nonetheless she's never had the issues with Catholics or cephalosporins in general but he is aware of. 07/27/18 on evaluation today patient presents following admission to the hospital on 07/09/18. He was subsequently discharged on 07/20/18. On 07/15/18 the patient underwent irrigation and debridement was soft tissue biopsy and bone biopsy as well as placement of a Wound VAC in the OR by Dr. Celine Ahr. During the hospital course the patient was placed on a Wound VAC and recommended follow up with surgery in three weeks actually with Dr. Delaine Lame who is  infectious disease. The patient was on vancomycin during the hospital course. 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 He also had a bone culture which revealed evidence of methicillin-resistant staph aureus. He is updated CT scan 07/09/18 reveals that he had progression of the which was performed on wound to breakdown down to the trochanter where he actually had irregularities there as well suggestive of osteomyelitis. This was a change just since 9 December when we last performed a CT scan. Obviously this one had gone downhill quite significantly and rapidly. At this point upon evaluation I feel like in general the patient's wound seems to be doing fairly well all things considered upon my evaluation today. Obviously this is larger and deeper than what I previously evaluated but at the same time he seems to be making some progress as far as the appearance of the granulation tissue is concerned. I'm happy in that regard. No fevers, chills, nausea, or vomiting noted at this time. He is on IV vancomycin and Rocephin at the facility. He is currently in [employer]. 08/03/18 upon evaluation today patient's wound appears to be doing better in regard to the overall appearance at this point in time. Fortunately he's been tolerating the Wound VAC without complication and states that the facility has been taking excellent care of the wound site. Overall I see some Slough noted on the surface which I am going to attempt sharp debridement today of but nonetheless other than this I feel like he's making progress. 08/09/18 on evaluation today patient's wound appears to be doing much better compared to even last week's evaluation. Do believe that the Wound VAC is been of great benefit for him. He has been tolerating the dressing changes that is the Wound VAC without any complication and he has excellent granulation noted currently. There is no need for sharp debridement at  this point. 08/16/18 on evaluation today patient actually appears to be doing very well in regard to the wound in the right gluteal fold region. This is showing signs of progress and again appears to be very healthy which is excellent news. Fortunately there is no sign of active infection by way of odor or drainage at this point. Overall I'm very pleased with how things stand. He seems to be tolerating the Wound VAC without complication. 08/23/18 on evaluation today patient actually appears to be doing better in regard to his wound. He has been tolerating the Wound VAC without complication and in fact it has been collecting a significant amount of drainage which I think is good news especially considering how the wound appears. Fortunately there is no signs of infection at this time definitely nothing appears to be worse which is good news. He has not been  started on the Bactrim and Flagyl that was recommended by Dr. Delaine Lame yet. I did actually contact her office this morning in order to check and see were things are that regard their gonna be calling me back. 08/30/18 on evaluation today patient actually appears to show signs of excellent improvement today compared to last evaluation. The undermining is getting much better the wound seems to be feeling quite nicely and I'm very pleased that the granulation in general. With that being said overall I feel like the patient has made excellent progress which is great news. No fevers, chills, nausea, or vomiting noted at this time. 09/06/18 on evaluation today patient actually appears to be doing rather well in regard to his right gluteal ulcer. This is showing signs of improvement in overall I'm very pleased with how things seem to be progressing. The patient likewise is please. Overall I see no evidence of infection he is about to complete his oral antibiotic regimen which is the end of the antibiotics for him in just about three days. PAETON, STUDER ([id number]) 09/13/18 on evaluation today patient's right Ischial ulcer appears to be showing signs of continued improvement which is excellent news. He's been tolerating the dressing changes without complication. Fortunately there's no signs of infection and the wound that seems to be doing very well. 09/28/18 on evaluation today patient appears to be doing rather well in regard to his right Ischial ulcer. He's been tolerating the Wound VAC without complication he knows there's much less drainage than there used to be this obviously is not a bad thing in my pinion. There's no evidence of infection despite the fact is but nothing about it now for several weeks. 10/04/18 on evaluation today patient appears to be doing better in regard to his right Ischial wound. He has been tolerating the Wound VAC without complication and I do believe that the silver nitrate last week was beneficial for him. Fortunately overall there's no evidence of active infection at this time which is great news. No fevers, chills, nausea, or vomiting noted at this time. 10/11/18 on evaluation today patient actually appears to be doing rather well in regard to his Ischial ulcer. He's been tolerating the Wound VAC still without complication I feel like this is doing a good job. No fevers, chills, nausea, or vomiting noted at this time. 11/01/18 on evaluation today patient presents after having not been seen in our clinic for several weeks secondary to the fact that he was on evaluation today patient presents after having not been seen in our clinic for several weeks secondary to the fact that he was in a skilled nursing facility which was on lockdown currently due to the covert 19 national emergency. Subsequently he was discharged from the facility on this past Friday and subsequently made an appointment to come in to see yesterday. Fortunately there's no signs of active infection at this time which is good news and overall he does  seem to have made progress since I last saw. Overall I feel like things are progressing quite nicely. The patient is having no pain. 11/08/18 on evaluation today patient appears to be doing okay in regard to his right gluteal ulcer. He has been utilizing a Wound VAC home health this changing this at this point since he's home from the skilled nursing facility. Fortunately there's no signs of obvious active infection at this time. Unfortunately though there's no obvious active infection he is having some maceration and his wife states that  when the sheets of the Wound VAC office on Sunday when it broke seal that he ended up having significant issues with some smell as well there concerned about the possibility of infection. Fortunately there's No fevers, chills, nausea, or vomiting noted at this time. 11/15/18 on evaluation today patient actually appears to be doing well in regard to his right gluteal ulcer. He has been tolerating the dressing changes without complication. Specifically the Wound VAC has been utilized up to this point. Fortunately there's no signs of infection and overall I feel like he has made progress even since last week when I last saw him. I'm actually fairly happy with the overall appearance although he does seem to have somewhat of a hyper granular overgrowth in the central portion of the wound which I think may require some sharp debridement to try flatness out possibly utilizing chemical cauterization following. 11/23/18 on evaluation today patient actually appears to be doing very well in regard to his sacral ulcer. He seems to be showing signs of improvement with good granulation. With that being said he still has the small area of hyper granulation right in the central portion of the wound which I'm gonna likely utilize silver nitrate on today. Subsequently he also keeps having a leak at the 6 o'clock location which is unfortunate we may be able to help out with some  suggestions to try to prevent this going forward. Fortunately there's no signs of active infection at this time. 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 He's been tolerating the dressing changes without complication. Fortunately there's no signs of active infection at this time. I've been rather pleased with how things have progressed there still some evidence of pressure getting to the area with some redness right around the immediate wound opening. Nonetheless other than this I'm not seeing any significant complications or issues the wound is somewhat hyper granular. Upon discussing with the patient and his wife today I'm not sure that the wound is being packed to the base with the foam at this point. And if it's not been packed fully that may be part of the reason why is not seen as much improvement as far as the granulation from the base out. Again we do not want pack too tightly but we need some of the firm to get to the base of the wound. I discussed this with patient and his wife today. 12/06/18 on evaluation today patient appears to be doing well in regard to his right gluteal pressure ulcer. He's been tolerating the dressing changes without complication. Fortunately there's no signs of active infection. He still has some hyper granular tissue and I do think it would be appropriate to continue with the chemical cauterization as of today. 12/16/18 on evaluation today patient actually appears to be doing okay in regard to his right gluteal ulcer. He is been tolerating the dressing changes without complication including the Wound VAC. Overall I feel like nothing seems to be worsening I do feel like that the hyper granulation buds in the central portion of the wound have improved to some degree with the silver nitrate. We will have to see how things continue to progress. 12/20/18 on evaluation today  patient actually appears to be doing much worse in my pinion even compared to last week's evaluation. Unfortunately as opposed to showing any signs of improvement the areas of hyper granular tissue in  the central portion of the wound seem to be getting worse. Subsequently the wound bed itself also seems to be getting deeper even compared to last week which is both unusual as well as concerning since prior he had been shown signs of improvement. Nonetheless I think that the issue could be that he's actually having some difficulty in issues with a deeper infection. There's no external signs of infection but nonetheless I am more worried about the internal, osteomyelitis, that could be restarting. He has not been on antibiotics for some time at this point. I think that it may be a good idea to go ahead and started back on an antibiotic therapy while we wait to see what the testing shows. 12/27/18 on evaluation today patient presents for follow-up concerning his left gluteal fold wound. Fortunately he appears to be doing well today. I did review the CT scan which was negative for any signs of osteomyelitis or acute abnormality this is excellent news. Overall I feel like the surface of the wound bed appears to be doing significantly better today compared to previously noted findings. There does not appear any signs of infection nor does he have any pain at this time. 01/03/19 on evaluation today patient actually appears to be doing quite well in regard to his ulcer. Post debridement last week he really did not have too much bleeding which is good news. Fortunately today this seems to be doing some better but we still has some of the hyper granular tissue noted in the base of the wound which is gonna require sharp debridement today as well. Overall I'm pleased with how things seem to be progressing since we switched away from the Wound VAC I think he is making some progress. 01/10/19 on evaluation today patient  appears to be doing better in regard to his right gluteal fold ulcer. He has been tolerating the dressing changes without complication. The debridement to seem to be helping with current away some of the poor hyper granular tissue bugs throughout the region of his gluteal fold wound. He's been tolerating the dressing changes otherwise without complication which is great news. No fevers, chills, nausea, or vomiting noted at this time. 01/17/19 on evaluation today patient actually appears to be doing excellent in regard to his wound. He's been tolerating the dressing changes without complication. Fortunately there is no signs of active infection at this time which is great news. No fevers, chills, nausea, or vomiting noted Clerk, Quintrell J. ([id number]) at this time. 01/24/19 on evaluation today patient actually appears to be doing quite well with regard to his ulcer. He has been tolerating the dressing changes without complication. Fortunately there's no signs of active infection at this time. Overall been very pleased with the progress that he seems to be making currently. 01/31/19-Patient returns at 1 week with apparent similarity in dimensions to the wound, with no signs of infection, he has been changing dressings twice a day 02/08/19 upon evaluation today patient actually appears to be doing well with regard to his right Ischial ulcer. The wound is not appear to be quite as deep and seems to be making progress which is good news. With that being said I'm still reluctant to go back to the Wound VAC at this point. He's been having to change the dressings twice a day which is a little bit much in my pinion from the wound care supplies standpoint. I think that possibly attempting to utilize extras orbit may be beneficial this may also  help to prevent any additional breakdown secondary to fluid retention in the wound itself. The patient is in agreement with giving this a try. 02/15/19 on evaluation  today patient actually appears to be doing decently well with regard to his ulcer in the right to gluteal fold location. He's been tolerating the dressing changes without complication. Fortunately there is no signs of active infection at this time. He is able to keep the current dressing in place more effectively for a day at a time whereas before he was having a changes to to three times a day. The actions or has been helpful in this regard. Fortunately there's no signs of anything getting worse and I do feel like he showing signs of good improvement with regard to the wound bed status. 02/22/2019 patient appears to be doing very well today with regard to his ulcer in the gluteal fold. Fortunately there is no signs of active infection and he has been tolerating the dressing changes without any complication. Overall extremely pleased with how things seem to be progressing. He has much less of the hyper granular projections within the wound these have slowly been debrided away and he seems to be doing well. The wound bed is more uniform. 03/01/19 on evaluation today patient appears to be doing unfortunately about the same in regard to his gluteal ulcer. He's been tolerating the dressing changes without complication. Fortunately there's no signs of active infection at this time. With that being said he continues to develop these hyper granular projections which I'm unsure of exactly what they are and why they are rising. Nonetheless I explained to the patient that I do believe it would be a good idea for [place] to stand a biopsy sample for pathology to see if that can shed any light on what exactly may be going on here. Fortunately I do not see any obvious signs of infection. With that being said the patient has had a little bit more drainage this week apparently compared to last week. 03/08/2019 on evaluation today patient actually appears to look somewhat better with regard to the appearance of his wound bed  at this time. This is good news. Overall I am very pleased with how things seem to have progressed just in the past week with a switch to the [facility] dressing. I think that has been beneficial for him. With that being said at this time the patient is concerned about his biopsy that I sent off last week unfortunately I do not have that report as of yet. Nonetheless we have called to obtain this and hopefully will hear back from the lab later this morning. 03/15/19 on evaluation today patient's wound actually appears to be doing okay today with regard to the overall appearance of the wound bed. He has been tolerating the dressing changes without complication he still has hyper granular tissue noted but fortunately that seems to be minimal at this point compared to some of what we've seen in the past. Nonetheless I do think that he is still having some issues currently with some of this type of granulation the biopsy and since all showed nothing more than just evidence of granulation tissue. Therefore there really is nothing different to initiate or do at this point. 03/24/19 on evaluation today patient appears to be doing a little better with regard to his ulcer. He's been tolerating the dressing changes without complication. Fortunately there is no signs of active infection at this time. No fevers, chills, nausea, or  vomiting noted at this time. I'm overall pleased with how things seem to be progressing. 03/29/2019 on evaluation today patient appears to be doing about the same in regard to his ulcer in the right gluteal fold. Unfortunately he is not seeming to make a lot of progress and the wound is somewhat stalled. There is no signs of active infection externally but I am concerned about the possibility of infection continuing in the ischial location which previously he did have infection noted. Again is not able to have an MRI so probably our best option for testing for this would be a triple  phase bone scan which will detect subtle changes in the bone more so than plain film x-rays. In the past they really have not been beneficial and in fact the CT scans even have been somewhat questionable at times. Nonetheless there is no signs of systemic infection which is at least good news but again his wound is not healing at all on the predicted schedule. 04/05/19 on evaluation today patient appears to be doing well all things considering with regard to his wound and the right gluteal fold. He actually has his triple bone scan scheduled for sometime in the next couple of days. With that being said I've also been looking to other possibilities of what could be causing hyper granular tissue were looking into the bone scan again for evaluating for the risk or possibility of infection deeper and I'm also gonna go ahead and see about obtaining a deep tissue culture today to send and see if there's any evidence of infection noted on culture. He's in agreement with that plan. 04/12/2019 on evaluation today patient presents for reevaluation here in our clinic concerning ongoing issues with his right gluteal fold ulcer. 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 It was discussed with him at that point that I think it would be appropriate for him to proceed with hyperbaric oxygen therapy especially in light of the fact that he is previously been on IV antibiotics at the beginning of the year for close to 3 months followed by several weeks of oral antibiotics that was all prescribed by infectious disease. He had surgical debridement around Christmas December 2019 due to an abscess and osteomyelitis of the ischial bone. Unfortunately this has not really proceeded as well as we would have liked and again we did a CT scan even a couple months ago as he cannot have an MRI secondary to having  issues with both a pain pump as well as a spinal cord stimulator which prevent him from going into an MRI machine. With that being said there were chronic changes noted at the ischial tuberosity which had progressed since December 2019 there was no evidence of fluid collection on that initial CT scan. With that being said that was on January 21, 2019. I am not sure that it was a sensitive enough test as compared to an MRI and then subsequently I ordered a bone scan for him which was actually completed on 03/29/2019 and this revealed that he does indeed have positive osteomyelitis involving the right ischial tuberosity. This is adjacent to the ulcer and I think is the reason that his ulcer is not healing. Subsequently I am in a place him back on oral antibiotics today unfortunately his wound culture showed just mixed gram-negative flora with no specific findings of a predominant organism.  Nonetheless being with the location I think a good broad-spectrum antibiotic for gram-negative's is a good choice at this point he is previously taken Levaquin without significant issues and I think that is an appropriate antibiotic for him at this time. 04/19/2019 on evaluation today patient actually appears to be doing fairly well with regard to his wound. He has been tolerating the dressing changes without complication. Fortunately there is no signs of active infection and he has been taking the oral antibiotics at this time. Subsequently we did make a referral to infectious disease although Dr. Steva Ready wants the patient to be seen by general surgery first in order MAURICO, PERRELL ([id number]) apparently to see if there is anything from a surgical standpoint that should be done prior to initiation of IV antibiotic therapy. Again the patient is okay with actually seeing Dr. Celine Ahr whom he has seen before we will make that referral. 04/26/2019 on evaluation today patient actually appears to be doing well with regard  to his wound. He has been tolerating the dressing changes without complication. Fortunately there is no signs of active infection at this time. I do believe that the hyperbaric oxygen therapy along with the antibiotics which I prescribed at this point have been doing well for him. With that being said he has not seen Dr. Celine Ahr the surgeon yet in fact they have not been contacted for scheduling an appointment as of yet. Subsequently the patient is not on antibiotics currently by IV Dr. Steva Ready did want him to see Dr. Celine Ahr first which we are working on trying to get scheduled. We again give the information to the patient today for Dr. Celine Ahr and her number as far as contacting their office to see about getting something scheduled. Again were looking for whether or not they recommend any surgical intervention. 05/03/2019 on evaluation today patient appears to be doing about the same with regard to his wound there may be a little bit of filling in in regard to the base of the wound but still he has quite a bit of hyper granular tissue that I am seeing today. I believe that he may may need a more aggressive sharp debridement possibly even by the surgeon or under anesthesia in order to clear away some of the hyper granular material in order to help allow for appropriate granulation to fill in. We have made a referral to Dr. Celine Ahr unfortunately it sounds as if they did not receive the referral we contacted them today they should be get in touch with the patient. Depending on how things show from that standpoint the patient may need to see Dr. Ardyth Gal who is the infectious disease specialist although he really does not want a PICC line again. No fevers, chills, nausea, vomiting, or diarrhea. He is tolerating hyperbaric oxygen therapy very well. 05/12/2019 on evaluation today patient presents for follow-up visit concerning his right gluteal fold ulcer. He did see Dr. Celine Ahr the surgeon who previously  evaluated his wound and she actually felt like he was doing quite well with regard to the wound based on what she was seen. He does seem to be responding to some degree with the oral antibiotics along with hyperbaric oxygen therapy at this point she did not see any evidence or need for further surgical intervention at this point. She recommended deferring additional or ongoing antibiotic therapy to Dr. Steva Ready at her discretion. Fortunately there is no signs of active systemic infection. No fevers, chills, nausea, vomiting, or diarrhea. 10/29; patient  I am seeing really for the first time today in terms of his wound. 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 He is being treated with hyperbaric oxygen for underlying osteomyelitis most recently documented by a three-phase bone scan. He has been on Levaquin for roughly a month and he is out of these and asked me for a refill. Most recent cultures were negative although I do not think these were bone cultures. He has a very irregular surface to this wound which is almost nodular in texture. It undermines superiorly with the same nodular hyper granulated surface. I see that he was referred to general surgery for an operative debridement although the surgeon did not agree with this I think that would have been the proper course of action in this. He has been using Hydrofera Blue. He is supposed to start a wound VAC tomorrow which is actually a reapplication of wound VAC. I think mostly last time they had trouble keeping the seal in place I hope we have better look at this this time. 05/24/2019 on evaluation today I did review patient's note from Dr. Dellia Nims where he was seen last week when I was out of the office. Subsequently it does appear that Dr. Dellia Nims was in agreement that the patient really needed debridement to clear away some of the nodular tissue in the base of the wound. The good news is he has the wound VAC initiated and  some of this tissue is already starting to breakdown under the treatment of the wound VAC again because it is not really viable. Nonetheless I do believe that we can likely perform some debridement today to clear this away and hopefully the continuation of the wound VAC along with hyperbarics and antibiotic therapy will be beneficial in stopping this from reoccurring. If we get better control in the wound bed in a good spot I think we can definitely use the PriMatrix which we actually did get approval for. 05/31/2019 on evaluation today patient appears to be doing actually pretty well at this point with regard to his wound. He has been tolerating the dressing changes which is good news. Fortunately there is no signs of infection. He still has some of the hyper granular nodules noted we have not really cleared all these away and I think we can try to do a little bit more that today. I did do quite a bit last week I am hoping to get down to the base of the wound so though actually be able to heal more appropriately. I think the wound VAC is doing a good job right now with controlling the drainage and overall the patient is happy with that. 06/07/2019 upon evaluation today patient appears to be doing quite well compared to last week with regard to the hyper granulation in the base of the wound. He has been tolerating the dressing changes without complication. Fortunately there is no signs of active infection at this time. With that being said we have not had any recent measures as far as blood work is concerned I think we may want to check a few things including a CBC, sed rate, and C-reactive protein. Patient is in agreement with that we can try to get that scheduled for him for this Friday. 06/13/2019 on evaluation today patient actually appears to be doing much better at this point based on what I am seeing with regard to his wound. He has much less in the way of hyper granular  tissue which is good  news and a lot more healing that is taken place since I last saw him as far as the amount of space within the wound itself. With that being said he is nearing the completion of the initial 40 treatments for hyperbaric oxygen therapy I think this is something I would recommend extending as he does seem to be benefiting at this time. Fortunately there is no evidence of active systemic infection at this point nor even local infection for that matter. 06/20/2019 upon evaluation today patient actually appears to be doing well with regard to his wound. In fact this appears to be doing much better today compared to what it was previous. I been extremely pleased with the progress that he is made. In fact the tunnel is much less deep today than what it was in the past and I am seeing excellent signs of improvement. Overall I am happy with how things are going. I did review his white blood cell count which revealed everything to be okay. The one abnormal finding on his CBC was a hemoglobin of 12.7. Subsequently I did also review his sed rate and C-reactive protein. His sed rate measured in at normal level measuring at 26. The C-reactive protein however was elevated today and an abnormal range indicating inflammation. Still I feel like he is trending in a good direction at this time. He is going to need a refill of the Levaquin he tells me at this point. 07/04/2019 on evaluation today patient actually appears to be doing well in regard to his wound. He has been tolerating the dressing changes without complication. Fortunately there is no signs of active infection which is good news. No fevers, chills, nausea, vomiting, or diarrhea. The good news also is that I did review his labs and though his sed rate was slightly elevated the C-reactive protein was actually down compared to the last evaluation 2 weeks ago this is excellent news I think it is showing signs of improvement. 07/11/2019 on evaluation today patient  actually appears to be doing quite well with regard to his wound on my evaluation. This is very slow going but nonetheless he seems to be making progress. Especially in regard to the depth. Nonetheless I believe that we may want to consider going forward with the PriMatrix which was previously approved for him. We will get a have to get a new approval as we will actually be applying this in the new year so we will likely obtain that approval on January 4. In the meantime and for the time being my suggestion is going to be that we go ahead and continue with the wound VAC and we were going at this point with the current wound care measures. 12/28-Patient seen after HBO session today, wound depth with the tunnel measures 3.2 cm, the wound bed appears healthy, continuing with the VAC and HBO Shevchenko, Nayib J. ([id number]) 07/25/2019 on evaluation today patient's wound appears to be doing about the same compared to my last evaluation. Fortunately there is no evidence of significant infection which is good news we will still waiting on the results of his lab work which unfortunately was not done prior to today's visit which is what we were aiming for. Ultimately there is no sign of active infection at this time which is good news. And he also tells me that the wound VAC has been staying in place and doing fairly well which is also good news. With that being said I  am concerned about the fact that the wound has not progressed as much I think we may want to look into getting reapproval for the skin substitute, specifically PriMatrix, that I am hopeful will help to allow this area to granulate in more effectively. I would recommend as well continuing with the wound VAC along with this. 08/01/2019 on evaluation today patient appears to be doing in my opinion slightly better in regard to the wound. He actually does have some tightening of the walls around the edges which is a good thing. Fortunately there is no  signs of active infection at this time. No fever chills noted. I do believe that the patient in general seems to be making progress although is very slow. Obviously were trying to get things to speed up somewhat more. No fevers, chills, nausea, vomiting, or diarrhea. 08/08/2019 upon evaluation today patient appears to be doing well with regard to his wound although there is not a significant improvement overall we do have the PriMatrix for availability today to apply. Fortunately there is no signs of active infection and the patient overall seems to be doing well. There is really no need for aggressive sharp debridement at this point either which is also good news. I do believe her get a need to suture and the primary checks in the basin in the PriMatrix in order to keep it in place at the base of the wound underneath the wound VAC. This was discussed with the patient today as well. He is in agreement the plan. He really does not have any filling in the area and so this should not be a complication as far as suturing is concerned. 08/15/2019 on evaluation today patient appears to be doing somewhat better in my opinion in regard to the overall appearance of his wound. He has been tolerating the dressing changes without complication. Fortunately the skin subappear to still be in place when this was changed on Friday. With that being said it appears that the PriMatrix may have dissolved in the interim between now and then as the sutures are no longer holding anything in place and again I would sutured it into place. Nonetheless I believe that we can go ahead and likely consider going forward that we may need to apply the PriMatrix weekly as opposed every other week as it does seem to have dissolved rather readily and well. Overall the wound bed appears to be doing excellent at this time. 08/22/2019 upon evaluation today the patient does seem to making some progress with regard to his wound. Overall there is  some better quality tissue and less of the hyper granular protrusions all of which is good news. With that being said we do have the PriMatrix available for application today and I am in a try to find a better way to suture this in place for him today. 08/29/2019 on evaluation today patient appears to be doing very well in regard to his wound. I do see signs of new granulation which seems to be somewhat helpful he did have some need for sharp debridement today to clear some of the hyper granulation away but nonetheless I feel like we are headed in a good direction. Fortunately there is no evidence of any systemic infection although we are going to go ahead and reorder the CBC, C-reactive protein, and sed rate today to see where things stand I did just refill his Levaquin as well. 09/05/2019 upon evaluation today patient's wound actually is not appearing to be  showing signs of significant improvement compared to what we have seen previous from the standpoint of depth. He overall wound size wise seems to be about the same and again some of the quality of the wound bed is still doing fairly well. I did review his labs and his CBC was normal with no signs of elevation white blood cell count, his C-reactive protein was normal, and the sed rate was slightly elevated but again I think this may just be due to ulceration really there is nothing significant to indicate severe infection. I think when he likely finishes his current round of oral antibiotics with Levaquin will likely take him off of the medication at that point based on the findings. I do feel like the hyperbarics helped in getting the infection under control as well as improving the overall size of the wound quite significantly. With that being said at this point I really feel like more more at the time frame that he may benefit from a muscle flap in order to try to help this wound heal effectively. I think that this would be the fastest way in order  to do this. In the past he has been somewhat resistant to going down that path but nonetheless I feel like he likely should. 09/12/2019 on evaluation today patient appears to be doing fairly well in regard to his wound. He is tolerating the wound VAC without complication is still continues to drain significantly. Fortunately there is no signs of active infection at this time which is great news his lab work is also been doing very well. With that being said I do think at this point that the patient is still deriving benefit from the wound VAC especially in regard to controlling moisture which I think it is doing fairly well. He also has some improvement in the overall wound bed and on the medial aspect of the wound there are some hyper granular projections but nothing as severe as what they have been in the past. I am good have to perform some sharp debridement at this location today. 09/19/2019 upon evaluation today patient appears to be doing roughly the same although again the tissue does seem to be showing some signs of improvement which is good news. There is less hyper granular budding. Nonetheless he still is going require some sharp debridement today. We did get his albumin and prealbumin results back and they are within normal limits in both cases which is excellent news that was the one thing [facility] plastic surgery was waiting on order to get him scheduled. He does seem to be a good candidate physically for the flap surgery if he decides to go that direction. With that being said is still left to be determined once they see the wound what they feel about how best to proceed. Obviously in the end it would be up to Mr. Turnbo as to whether he goes forward with the flap surgery or something else depending on the recommendations. 09/26/2019 upon evaluation today patient appears to be doing about the same in regard to his wound. Fortunately there is no signs of active infection there is also not as  much hyper granulation noted today in fact I do not believe there is can be any sharp debridement required at this point. With that being said I do believe he continues to be somewhat stalled at this point with regard to his wound. There is no signs of active infection at this time which is good news. He  does have an appointment with [facility] plastic surgery on Friday. 10/03/2019 upon evaluation today patient appears to be doing somewhat poorly especially in his overall demeanor compared to last week's evaluation. He unfortunately did see the physician at [facility] plastic surgery. Unfortunately the plan for the possibility of a flap would be a fairly significant surgery that the patient is really not wanting to take the risk of as if the surgery failed he would end up with a very large open wound at that point. Fortunately there is no signs of active infection at this time. No fevers, chills, nausea, vomiting, or diarrhea. With that being said there was some question that the physician had about why this would not be healing and the possibility that there may be some bone still needs to be removed deeper in the wound. Again the bone scan that we did previous actually did show evidence of there still being a chronic osteomyelitis back on March 29, 2019. With that being said he did undergo treatment both with hyperbarics as well as oral antibiotics for quite some time since that point. Obviously he may need additional and updated imaging with the surgeon in order to see if there is anything else that can or will need to be done in order to help this area to heal. Obviously I still believe as well that there is something going on here. He is already seeing the surgeon that performed the original surgery after the bone scan but they did not feel that anything needed to be done from a surgical standpoint at that time. 10/10/2019 upon evaluation today patient appears to be really doing about the same with regard to  his wound in the gluteal fold region. Unfortunately he is not making a lot of improvement the insurance also contacted home health and apparently the wound VAC is no longer to be RUE, TINNEL. ([id number]) covered due to lack of progress. Obviously that is something that we discussed as well as a strong possibility for happening soon and this was even last week that discussed this with him. With that being said again we have tried to manage this wound as best as I could up to this point. Fortunately there is no evidence of active infection at this time. No fevers, chills, nausea, vomiting, or diarrhea. 10/17/2019 upon evaluation today patient has decided to proceed with the flap surgery. He actually seems to be in better spirits today he states he wants to get this healed and after talking to the surgeon last week he feels like that is his best option for doing so. With that being said he also needs to have a referral to infectious disease to be seen for IV antibiotics preoperatively and postoperatively as far as the skin flap is concerned. He wonders if Dr. Steva Ready would be interested or available to do this for him. I explained that I would definitely have to check with her and I will be happy to do that but I am not certain What her answer will be in 2 I have a discussion with her. 10/24/2019 upon evaluation today patient appears to be doing about the same with regard to his wound although he is complaining of increased pain he is really not had any discomfort he states that it is getting much worse at this point. Fortunately there is no signs of active infection. At least not systemically. With that being said I question whether local infection could be a problem here secondary to the fact that  to be honest the patient is having pain where he has not previous. 10/31/2019 upon evaluation today patient appears to be doing about the same in regard to his wound in the right gluteal fold region.  Subsequently in the interim since I last saw him last week I did receive a message from Dr. Steva Ready as well as reviewed her note. She was requesting the potential for a bone culture or at least a deep tissue culture in order to see about helping with more directed antibiotic therapy for the patient's projected flap surgery. If we could not obtain more specific organisms for treatment then obviously should be looking at more broad-spectrum antibiotic therapy. With that being said at this point the patient seems ready to move forward with the flap surgery. I did actually sign the paperwork for his bed which came through last week I believe that was on Thursday or Friday. Either way that should be coming to him as well so he will have that in place once the surgery occurs when he is discharged home. Fortunately there is no signs of active infection at this time. No fevers, chills, nausea, vomiting, or diarrhea. 11/15/2019 upon evaluation today patient appears to be doing about the same with regard to his right gluteal fold ulcer. He has been tolerating the dressing changes without complication right now he is awaiting the hospital bed with air mattress in order to be able to be scheduled for his flap surgery. He is on IV vancomycin at this point is managed by Dr. Steva Ready. 11/30/2019; right gluteal fold extending over the ischial tuberosity. He had underlying osteomyelitis and there is still managing on vancomycin directed by infectious disease. His hospital bed and level 3 surface are arriving. After that there is plans for a myocutaneous flap at [facility]. They state that he will be in hospital for 2 weeks and for at least 2 weeks in a skilled facility may be longer Electronic Signature(s) Signed: 12/01/2019 7:53:18 AM By: Linton Ham MD Entered By: Linton Ham on 11/30/2019 08:52:32 BONIFACIO, PRUDEN  ([id number]) -------------------------------------------------------------------------------- Physical Exam Details Patient Name: [name] Date of Service: 11/30/2019 8:00 AM Medical Record Number: [id number] Patient Account Number: [id number] Date of Birth/Sex: 1950/11/22 (69 y.o. M) Treating RN: [name] Primary Care Provider: [name] Other Clinician: Referring Provider: [name] Treating Provider/Extender: [name] in Treatment: 104 Constitutional Patient is hypertensive.. Pulse regular and within target range for patient.Marland Kitchen Respirations regular, non-labored and within target range.. Temperature is normal and within the target range for the patient.Marland Kitchen appears in no distress. Integumentary (Hair, Skin) No surrounding erythema. Psychiatric Patient appears depressed today.. Notes Wound exam; granulation tissue looks marginal. No debridement. Large undermining area. Hyper granulated areas as well. No bone is palpable. There is no evidence of surrounding soft tissue infection. Electronic Signature(s) Signed: 12/01/2019 7:53:18 AM By: Linton Ham MD Entered By: Linton Ham on 11/30/2019 08:54:40 HUMBERTO, ADDO ([id number]) -------------------------------------------------------------------------------- Physician Orders Details Patient Name: [name] Date of Service: 11/30/2019 8:00 AM Medical Record Number: [id number] Patient Account Number: [id number] Date of Birth/Sex: August 11, 1950 (69 y.o. M) Treating RN: [name] Primary Care Provider: [name] Other Clinician: Referring Provider: [name] Treating Provider/Extender: [name] in Treatment: 104 Verbal / Phone Orders: No Diagnosis Coding Wound Cleansing Wound #1 Right Gluteal fold o Clean wound with Normal Saline. - in office o Cleanse wound with mild soap and water Anesthetic (add to Medication List) Wound #1 Right Gluteal  fold o Topical Lidocaine 4% cream applied to wound bed prior to debridement (In Clinic Only). Skin Barriers/Peri-Wound Care o Skin Prep Primary Wound Dressing Wound #1 Right Gluteal fold o Silver Alginate Secondary Dressing Wound #1 Right Gluteal fold o Tegaderm o XtraSorb Dressing Change Frequency Wound #1 Right Gluteal fold o Change dressing every other day. Follow-up Appointments Wound #1 Right Gluteal fold o Return Appointment in 2 weeks. Off-Loading Wound #1 Right Gluteal fold o Turn and reposition every 2 hours - no pressure on wounded area Home Health Wound #1 Right Gluteal fold o [place] [facility] - [place] Nurse may visit PRN to address patientos wound care needs. o FACE TO FACE ENCOUNTER: MEDICARE and MEDICAID PATIENTS: I certify that this patient is under my care and that I had a face-to-face encounter that meets the physician face-to-face encounter requirements with this patient on this date. The encounter with the patient was in whole or in part for the following MEDICAL CONDITION: (primary reason for [place]) MEDICAL NECESSITY: I certify, that based on my findings, NURSING services are a medically necessary home health service. HOME BOUND STATUS: I certify that my clinical findings support that this patient is homebound (i.e., Due to illness or injury, pt requires aid of supportive devices such as crutches, cane, wheelchairs, walkers, the use of special transportation or the assistance of another person to leave their place of residence. There is a normal inability to leave the home and doing so requires considerable and taxing effort. Other absences are for medical reasons / religious services and are infrequent or of short duration when for other reasons). o If current dressing causes regression in wound condition, may D/C ordered dressing product/s and apply Normal Saline Moist Dressing daily until next [place] / Other MD appointment. [place] of regression in wound condition at [phone]. o Please direct any NON-WOUND related issues/requests for orders to patient's Primary Care Physician MADDON, HORTON ([id number]) Electronic Signature(s) Signed: 12/01/2019 7:53:18 AM By: Linton Ham MD Signed: 01/17/2020 12:53:52 PM By: Gretta Cool, BSN, RN, CWS, Kim RN, BSN Entered By: Gretta Cool, BSN, RN, CWS, Kim on 11/30/2019 08:32:11 NEITHAN, DAY ([id number]) -------------------------------------------------------------------------------- Problem List Details Patient Name: [name] Date of Service: 11/30/2019 8:00 AM Medical Record Number: [id number] Patient Account Number: [id number] Date of Birth/Sex: Dec 25, 1950 (69 y.o. 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 He states that there is no history of diabetes that he is aware of. He does have issues with his prostate and is currently receiving treatment for this by way of oral medication. With that being said I do not have a lot of details in that regard. Nonetheless the patient presents today as a result of having been referred to [place] by another provider initially home health was set to come out and take care of his wound although due to the fact that he apparently drives he's not able to receive home health. His wife is therefore trying to help take care of this wound within although they have been struggling with what exactly to do at this point. She states that she can do some things but she is definitely not a nurse and does have some issues with looking at blood. The good news is the wound does not appear to be too deep and is fairly superficial at this point. There is no slough noted there is some nonviable skin noted around the surface of the wound and the perimeter at this point. The central portion of the wound appears to be very good with a dermal layer noted this does not appear to be again deep enough to extend it to subcutaneous tissue at this point. 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 In the pump he has Dilaudid and baclofen. 12/07/17 on evaluation today patient presents for follow-up concerning his ongoing  lower back thigh ulcer on the right. He states that he did not get the supplies ordered and therefore has not really been able to perform the dressing changes as directed exactly. His wife was able to get some Boarder Foam Dressing's from the drugstore and subsequently has been using hydrogel which did help to a degree in the wound does appear to be able smaller. There is actually more drainage this week noted than previous. 12/21/17 on evaluation today patient appears to be doing rather well in regard to his right gluteal ulcer. He has been tolerating the dressing changes without complication. There does not appear to be any evidence of infection at this point in time. Overall the wound does seem to be making some progress as far as the edges are concerned there's not as much in the way of overlapping of the external wound edges and he has a good epithelium to wound bed border for the most part. This however is not true right at the 12 o'clock location over the span of a little over a centimeters which actually will require debridement today to clean this away and hopefully allow it to continue to heal more appropriately. 12/28/17 on evaluation today patient appears to be doing rather well in regard to his ulcer in the left gluteal region. He's been tolerating the dressing changes without complication. Apparently he has had some difficulty getting his dressing material. Apparently there's been some confusion with ordering we're gonna check into this. Nonetheless overall he's been showing signs of improvement which is good news. Debridement is not required today. 01/04/18 on evaluation today patient presents for follow-up concerning his right gluteal ulcer. He has been tolerating the dressing changes fairly well. On inspection today it appears he may actually have some maceration them concerned  about the fact that he may be developing too much moisture in and around the wound bed which can cause delay in  healing. With that being said he unfortunately really has not showed significant signs of improvement since last week's evaluation in fact this may even be just the little bit/slightly larger. Nonetheless he's been having a lot of discomfort I'm not sure this is even related to the wound as he has no pain when I'm to breeding or otherwise cleaning the wound during evaluation today. Nonetheless this is something that we did recommend he talked to his pain specialist concerning. 01/11/18 on evaluation today patient appears to be doing better in regard to his ulceration. He has been tolerating the dressing changes without complication. With that being said overall there's no evidence of infection which is good news. The only thing is he did receive the hatch affair blue classic versus the ready nonetheless I feel like this is perfectly fine and appears to have done well for him over the past week. 01/25/18 on evaluation today patient's wound actually appears to be a little bit larger than during the last evaluation. The good news is the majority of the wound edges actually appear to be fairly firmly attached to the wound bed unfortunately again we're not really making progress in regard to the size. Roughly the wound is about the same size as when I first saw him although again the wound margin/edges appear to be much better. 02/01/18 on evaluation today patient actually appears to be doing very well in regard to his wound. Applying the Prisma dry does seem to be better although he does still have issues with slow progression of the wound. There was a slight improvement compared to last week's measurements today. Nonetheless I have been considering other options as far as the possibility of Theraskin or even a snap vac. In general I'm not sure that the Theraskin due to location of the wound would be a very good idea. Nonetheless I do think that a snap vac could be a possibility for the patient and in fact I  think this could even be an excellent way to manage the wound possibly seeing some improvement in a very rapid fashion here. Nonetheless this is something that we would need to get approved and I did have a lengthy conversation with the patient about this today. 02/08/18 on evaluation today patient appears to be doing a little better in regard to his ulcer. He has been tolerating the dressing changes without complication. Fortunately despite the fact that the wound is a little bit smaller it's not significantly so unfortunately. We have discussed the possibility of a snap vac we did check with insurance this is actually covered at this point. Fortunately there does not appear to be any sign of infection. Overall I'm fairly pleased with how things seem to be appearing at this point. 02/15/18 on evaluation today patient appears to be doing rather well in regard to his right gluteal ulcer. Unfortunately the snap vac did not stay in place with his sheer and friction this came loose and did not seem to maintain seal very well. He worked for about two days and it did seem to do very well during that time according to his wife but in general this does not seem to be something that's gonna be beneficial for him long-term. I do believe we need to go back to standard dressings to see if we can find something that will be of benefit.  03/02/18- He is here in follow up evaluation; there is minimal change in the wound. He will continue with the same treatment plan, would consider changing to iodosrob/iodoflex if ulcer continues to to plateau. He will follow up next week XAYDEN, LINSEY ([id number]) 03/08/18 on evaluation today patient's wound actually appears to be about the same size as when I previously saw him several weeks back. Unfortunately he does have some slightly dark discoloration in the central portion of the wound which has me concerned about pressure injury. I do believe he may be sitting for too  long a period of time in fact he tells me that "I probably sit for much too long". He does have some Slough noted on the surface of the wound and again as far as the size of the wound is concerned I'm really not seeing anything that seems to have improved significantly. 03/15/18 on evaluation today patient appears to be doing fairly well in regard to his ulcer. The wound measured pretty much about the same today compared to last week's evaluation when looking at his graph. With that being said the area of bruising/deep tissue injury that was noted last week I do not see at this point. He did get a new cushion fortunately this does seem to be have been of benefit in my pinion. It does appear that he's been off of this more which is good news as well I think that is definitely showing in the overall wound measurements. With that being said I do believe that he needs to continue to offload I don't think that the fact this is doing better should be or is going to allow him to not have to offload and explain this to him as well. Overall he seems to be in agreement the plan I think he understands. The overall appearance of the wound bed is improved compared to last week I think the Iodoflex has been beneficial in that regard. 03/29/18 on evaluation today patient actually appears to be doing rather well in regard to his wound from the overall appearance standpoint he does have some granulation although there's some Slough on the surface of the wound noted as well. With that being said he unfortunately has not improved in regard to the overall measurement of the wound in volume or in size. I did have a discussion with him very specifically about offloading today. He actually does work although he mainly is just sitting throughout the day. He tells me he offloads by "lifting himself up for 30 seconds off of his chair occasionally" purchase from [employer] which does seem to have helped. And he has a new  cushion that he with that being said he's also able to stand some for a very short period of time but not significant enough I think to provide appropriate offloading. I think the biggest issue at this point with the wound and the fact is not healing as quickly as we would like is due to the fact that he is really not able to appropriately offload while at work. He states the beginning after his injury he actually had a bed at his job that he could lay on in order to offload and that does seem to have been of help back at that time. Nonetheless he had not done this in quite some time unfortunately. I think that could be helpful for him this is something I would like for him to look into. 04/05/18 on evaluation today patient actually presents  for follow-up concerning his right gluteal ulcer. Again he really is not significantly improved even compared to last week. He has been tolerating the dressing changes without complication. With that being said fortunately there appears to be no evidence of infection at this time. He has been more proactive in trying to offload. 04/12/18 on evaluation today patient actually appears to be doing a little better in regard to his wound and the right gluteal fold region. He's been tolerating the dressing changes since removing the oasis without complication. However he was having a lot of burning initially with the oasis in place. He's unsure of exactly why this was given so much discomfort but he assumes that it was the oasis itself causing the problem. Nonetheless this had to be removed after about three days in place although even those three days seem to have made a fairly good improvement in regard to the overall appearance of the wound bed. In fact is the first time that he's made any improvement from the standpoint of measurements in about six weeks. He continues to have no discomfort over the area of the wound itself which leads me to wonder why he was having the  burning with the oasis when he does not even feel the actual debridement's themselves. I am somewhat perplexed by this. 04/19/18 on evaluation today patient's wound actually appears to be showing signs of epithelialization around the edge of the wound and in general actually appears to be doing better which is good news. He did have the same burning after about three days with applying the Endoform last week in the same fashion that I would generally apply a skin substitute. This seems to indicate that it's not the oasis to cause the problem but potentially the moisture buildup that just causes things to burn or there may be some other reaction with the skin prep or Steri-Strips. Nonetheless I'm not sure that is gonna be able to tolerate any skin substitute for a long period of time. The good news is the wound actually appears to be doing better today compared to last week and does seem to finally be making some progress. 04/26/18 on evaluation today patient actually appears to be doing rather well in regard to his ulcer in the right gluteal fold. He has been tolerating the dressing changes without complication which is good news. The Endoform does seem to be helping although he was a little bit more macerated this week. This seems to be an ongoing issue with fluid control at this point. Nonetheless I think we may be able to add something like Drawtex to help control the drainage. 05/03/18 on evaluation today patient appears to actually be doing better in regard to the overall appearance of his wound. He has been tolerating the dressing changes without complication. Fortunately there appears to be no evidence of infection at this time. I really feel like his wound has shown signs as of today of turning around last week I thought so as well and definitely he could be seen in this week's overall appearance and measurements. In general I'm very pleased with the fact that he finally seems to be making a  steady but sure progress. The patient likewise is very pleased. 05/17/18 on evaluation today patient appears to be doing more poorly unfortunately in regard to his ulcer. He has been tolerating the dressing changes without complication. With that being said he tells me that in the past couple of days he and his wife have noticed  that we did not seem to be doing quite as well is getting dark near the center. Subsequently upon evaluation today the wound actually does appear to be doing worse compared to previous. He has been tolerating the dressing changes otherwise and he states that he is not been sitting up anymore than he was in the past from what he tells me. Still he has continued to work he states "I'm tired of dealing with this and if I have to just go home and lay in the bed all the time that's what I'll do". Nonetheless I am concerned about the fact that this wound does appear to be deeper than what it was previous. 05/24/18 upon evaluation today patient actually presents after having been in the hospital due to what was presumed to be sepsis secondary to the wound infection. He had an elevated white blood cell count between 14 and 15. With that being said he does seem to be doing somewhat better now. His wound still is giving him some trouble nonetheless and he is obviously concerned about the fact likely talked about that this does seem to go more deeply than previously noted. I did review his wound culture which showed evidence of Staphylococcus aureus him and group B strep. Nonetheless he is on antibiotics, Levaquin, for this. Subsequently I did review his intake summary from the hospital as well. I also did look at the CT of the lumbar spine with contrast that was performed which showed no bone destruction to suggest lumbar disguises/osteomyelitis or sacral osteomyelitis. There was no paraspinal abscess. Nonetheless it appears this may have been more of just a soft tissue infection at this  point which is good news. He still is nonetheless concerned about the wound which again I think is completely reasonable considering everything he's been through recently. 05/31/18 on evaluation today on evaluation today patient actually appears to be showing signs of his wound be a little bit deeper than what I would like to see. Fortunately he does not show any signs of significant infection although his temperature was 99 today he states he's been checking this at home and has not been elevated. Nonetheless with the undermining that I'm seeing at this point I am becoming more concerned about the wound I do think that offloading is a key factor here that is preventing the speedy recovery at this point. There does not appear to be any evidence of again over infection noted. He's been using Santyl currently. 06/07/18 the patient presents today for follow-up evaluation regarding the left ulcer in the gluteal region. He has been tolerating the Wound VAC Onstad, Cassey J. ([id number]) fairly well. He is obviously very frustrated with this he states that to mean is really getting in his way. There does not appear to be any evidence of infection at this time he does have a little bit of odor I do not necessarily associate this with infection just something that we sometimes notice with Wound VAC therapy. With that being said I can definitely catch a tone of discontentment overall in the patient's demeanor today. This when he was previously in the hospital an CT scan was done of the lumbar region which did not reveal any signs of osteomyelitis. With that being said the pelvis in particular was not evaluated distinctly which means he could still have some osteonecrosis I. Nonetheless the Wound VAC was started on Thursday I do want to get this little bit more time before jumping to a CT scan of  the pelvis although that is something that I might would recommend if were not see an improvement by that  time. 06/14/18 on evaluation today patient actually appears to be doing about the same in regard to his right gluteal ulcer. Again he did have a CT scan of the lumbar spine unfortunately this did not include the pelvis. Nonetheless with the depth of the wound that I'm seeing today even despite the fact that I'm not seeing any evidence of overt cellulitis I believe there's a good chance that we may be dealing with osteomyelitis somewhere in the right Ischial region. No fevers, chills, nausea, or vomiting noted at this time. 06/21/18 on evaluation today patient actually appears to be doing about the same with regard to his wound. The tunnel at 6 o'clock really does not appear to be any deeper although it is a little bit wider. I think at this point you may want to start packing this with white phone. Unfortunately I have not got approval for the CT scan of the pelvis as of yet due to the fact that Medicare apparently has been denied it due to the diagnosis codes not being appropriate according to Medicare for the test requested. With that being said the patient cannot have an MRI and therefore this is the only option that we have as far as testing is concerned. The patient has had infection and was on antibiotics and been added code for cellulitis of the bottom to see if this will be appropriate for getting the test approved. Nonetheless I'm concerned about the infection have been spread deeper into the Ischial region. 06/28/18 on evaluation today patient actually appears to be doing rather well all things considered in regard to the right gluteal ulcer. He has been tolerating the dressing changes without complication. With that being said the Wound VAC he states does have to be replaced almost every day or at least reinforced unfortunately. Patient actually has his CT scan later this morning we should have the results by tomorrow. 07/05/18 on evaluation today patient presents for follow-up concerning his  right Ischial ulcer. He did see the surgeon Dr. Lysle Pearl last week. They were actually very happy with him and felt like he spent a tremendous amount of time with them as far as discussing his situation was concerned. In the end Dr. Lysle Pearl did contact me as well and determine that he would not recommend any surgical intervention at this point as he felt like it would not be in the patient's best interest based on what he was seeing. He recommended a referral to infectious disease. Subsequently this is something that Dr. Ines Bloomer office is working on setting up for the patient. As far as evaluation today is concerned the patient's wound actually appears to be worse at this point. I am concerned about how things are progressing and specifically about infection. I do not feel like it's the deeper but the area of depth is definitely widening which does have me concerned. No fevers, chills, nausea, or vomiting noted at this time. I think that we do need initiate antibiotic therapy the patient has an allow allergy to amoxicillin/penicillin he states that he gets a rash since childhood. Nonetheless she's never had the issues with Catholics or cephalosporins in general but he is aware of. 07/27/18 on evaluation today patient presents following admission to the hospital on 07/09/18. He was subsequently discharged on 07/20/18. On 07/15/18 the patient underwent irrigation and debridement was soft tissue biopsy and bone biopsy as  well as placement of a Wound VAC in the OR by Dr. Celine Ahr. During the hospital course the patient was placed on a Wound VAC and recommended follow up with surgery in three weeks actually with Dr. Delaine Lame who is infectious disease. The patient was on vancomycin during the hospital course. 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 He also had a bone culture which revealed evidence of methicillin-resistant staph aureus. He is updated CT scan 07/09/18 reveals that  he had progression of the which was performed on wound to breakdown down to the trochanter where he actually had irregularities there as well suggestive of osteomyelitis. This was a change just since 9 December when we last performed a CT scan. Obviously this one had gone downhill quite significantly and rapidly. At this point upon evaluation I feel like in general the patient's wound seems to be doing fairly well all things considered upon my evaluation today. Obviously this is larger and deeper than what I previously evaluated but at the same time he seems to be making some progress as far as the appearance of the granulation tissue is concerned. I'm happy in that regard. No fevers, chills, nausea, or vomiting noted at this time. He is on IV vancomycin and Rocephin at the facility. He is currently in [facility]. 08/03/18 upon evaluation today patient's wound appears to be doing better in regard to the overall appearance at this point in time. Fortunately he's been tolerating the Wound VAC without complication and states that the facility has been taking excellent care of the wound site. Overall I see some Slough noted on the surface which I am going to attempt sharp debridement today of but nonetheless other than this I feel like he's making progress. 08/09/18 on evaluation today patient's wound appears to be doing much better compared to even last week's evaluation. Do believe that the Wound VAC is been of great benefit for him. He has been tolerating the dressing changes that is the Wound VAC without any complication and he has excellent granulation noted currently. There is no need for sharp debridement at this point. 08/16/18 on evaluation today patient actually appears to be doing very well in regard to the wound in the right gluteal fold region. This is showing signs of progress and again appears to be very healthy which is excellent news. Fortunately there is no sign of active infection  by way of odor or drainage at this point. Overall I'm very pleased with how things stand. He seems to be tolerating the Wound VAC without complication. 08/23/18 on evaluation today patient actually appears to be doing better in regard to his wound. He has been tolerating the Wound VAC without complication and in fact it has been collecting a significant amount of drainage which I think is good news especially considering how the wound appears. Fortunately there is no signs of infection at this time definitely nothing appears to be worse which is good news. He has not been started on the Bactrim and Flagyl that was recommended by Dr. Delaine Lame yet. I did actually contact her office this morning in order to check and see were things are that regard their gonna be calling me back. 08/30/18 on evaluation today patient actually appears to show signs of excellent improvement today compared to last evaluation. The undermining is getting much better the wound seems to be feeling quite nicely and I'm very pleased that the granulation in general. With that being said overall I  feel like the patient has made excellent progress which is great news. No fevers, chills, nausea, or vomiting noted at this time. 09/06/18 on evaluation today patient actually appears to be doing rather well in regard to his right gluteal ulcer. This is showing signs of improvement in overall I'm very pleased with how things seem to be progressing. The patient likewise is please. Overall I see no evidence of infection he is about to complete his oral antibiotic regimen which is the end of the antibiotics for him in just about three days. 09/13/18 on evaluation today patient's right Ischial ulcer appears to be showing signs of continued improvement which is excellent news. He's been tolerating the dressing changes without complication. Fortunately there's no signs of infection and the wound that seems to be doing very well. 09/28/18 on  evaluation today patient appears to be doing rather well in regard to his right Ischial ulcer. He's been tolerating the Wound VAC Onley, Regnald J. ([id number]) without complication he knows there's much less drainage than there used to be this obviously is not a bad thing in my pinion. There's no evidence of infection despite the fact is but nothing about it now for several weeks. 10/04/18 on evaluation today patient appears to be doing better in regard to his right Ischial wound. He has been tolerating the Wound VAC without complication and I do believe that the silver nitrate last week was beneficial for him. Fortunately overall there's no evidence of active infection at this time which is great news. No fevers, chills, nausea, or vomiting noted at this time. 10/11/18 on evaluation today patient actually appears to be doing rather well in regard to his Ischial ulcer. He's been tolerating the Wound VAC still without complication I feel like this is doing a good job. No fevers, chills, nausea, or vomiting noted at this time. 11/01/18 on evaluation today patient presents after having not been seen in our clinic for several weeks secondary to the fact that he was on evaluation today patient presents after having not been seen in our clinic for several weeks secondary to the fact that he was in a skilled nursing facility which was on lockdown currently due to the covert 19 national emergency. Subsequently he was discharged from the facility on this past Friday and subsequently made an appointment to come in to see yesterday. Fortunately there's no signs of active infection at this time which is good news and overall he does seem to have made progress since I last saw. Overall I feel like things are progressing quite nicely. The patient is having no pain. 11/08/18 on evaluation today patient appears to be doing okay in regard to his right gluteal ulcer. He has been utilizing a Wound VAC home health this  changing this at this point since he's home from the skilled nursing facility. Fortunately there's no signs of obvious active infection at this time. Unfortunately though there's no obvious active infection he is having some maceration and his wife states that when the sheets of the Wound VAC office on Sunday when it broke seal that he ended up having significant issues with some smell as well there concerned about the possibility of infection. Fortunately there's No fevers, chills, nausea, or vomiting noted at this time. 11/15/18 on evaluation today patient actually appears to be doing well in regard to his right gluteal ulcer. He has been tolerating the dressing changes without complication. Specifically the Wound VAC has been utilized up to this point. Fortunately there's  no signs of infection and overall I feel like he has made progress even since last week when I last saw him. I'm actually fairly happy with the overall appearance although he does seem to have somewhat of a hyper granular overgrowth in the central portion of the wound which I think may require some sharp debridement to try flatness out possibly utilizing chemical cauterization following. 11/23/18 on evaluation today patient actually appears to be doing very well in regard to his sacral ulcer. He seems to be showing signs of improvement with good granulation. With that being said he still has the small area of hyper granulation right in the central portion of the wound which I'm gonna likely utilize silver nitrate on today. Subsequently he also keeps having a leak at the 6 o'clock location which is unfortunate we may be able to help out with some suggestions to try to prevent this going forward. Fortunately there's no signs of active infection at this time. 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 He's been tolerating the dressing  changes without complication. Fortunately there's no signs of active infection at this time. I've been rather pleased with how things have progressed there still some evidence of pressure getting to the area with some redness right around the immediate wound opening. Nonetheless other than this I'm not seeing any significant complications or issues the wound is somewhat hyper granular. Upon discussing with the patient and his wife today I'm not sure that the wound is being packed to the base with the foam at this point. And if it's not been packed fully that may be part of the reason why is not seen as much improvement as far as the granulation from the base out. Again we do not want pack too tightly but we need some of the firm to get to the base of the wound. I discussed this with patient and his wife today. 12/06/18 on evaluation today patient appears to be doing well in regard to his right gluteal pressure ulcer. He's been tolerating the dressing changes without complication. Fortunately there's no signs of active infection. He still has some hyper granular tissue and I do think it would be appropriate to continue with the chemical cauterization as of today. 12/16/18 on evaluation today patient actually appears to be doing okay in regard to his right gluteal ulcer. He is been tolerating the dressing changes without complication including the Wound VAC. Overall I feel like nothing seems to be worsening I do feel like that the hyper granulation buds in the central portion of the wound have improved to some degree with the silver nitrate. We will have to see how things continue to progress. 12/20/18 on evaluation today patient actually appears to be doing much worse in my pinion even compared to last week's evaluation. Unfortunately as opposed to showing any signs of improvement the areas of hyper granular tissue in the central portion of the wound seem to be getting worse. Subsequently the wound bed  itself also seems to be getting deeper even compared to last week which is both unusual as well as concerning since prior he had been shown signs of improvement. Nonetheless I think that the issue could be that he's actually having some difficulty in issues with a deeper infection. There's no external signs of infection but nonetheless I am more worried about the internal, osteomyelitis, that could be restarting. He  has not been on antibiotics for some time at this point. I think that it may be a good idea to go ahead and started back on an antibiotic therapy while we wait to see what the testing shows. 12/27/18 on evaluation today patient presents for follow-up concerning his left gluteal fold wound. Fortunately he appears to be doing well today. I did review the CT scan which was negative for any signs of osteomyelitis or acute abnormality this is excellent news. Overall I feel like the surface of the wound bed appears to be doing significantly better today compared to previously noted findings. There does not appear any signs of infection nor does he have any pain at this time. 01/03/19 on evaluation today patient actually appears to be doing quite well in regard to his ulcer. Post debridement last week he really did not have too much bleeding which is good news. Fortunately today this seems to be doing some better but we still has some of the hyper granular tissue noted in the base of the wound which is gonna require sharp debridement today as well. Overall I'm pleased with how things seem to be progressing since we switched away from the Wound VAC I think he is making some progress. 01/10/19 on evaluation today patient appears to be doing better in regard to his right gluteal fold ulcer. He has been tolerating the dressing changes without complication. The debridement to seem to be helping with current away some of the poor hyper granular tissue bugs throughout the region of his gluteal fold wound.  He's been tolerating the dressing changes otherwise without complication which is great news. No fevers, chills, nausea, or vomiting noted at this time. 01/17/19 on evaluation today patient actually appears to be doing excellent in regard to his wound. He's been tolerating the dressing changes without complication. Fortunately there is no signs of active infection at this time which is great news. No fevers, chills, nausea, or vomiting noted at this time. 01/24/19 on evaluation today patient actually appears to be doing quite well with regard to his ulcer. He has been tolerating the dressing changes without complication. Fortunately there's no signs of active infection at this time. Overall been very pleased with the progress that he seems to be SHANTI, EICHEL. ([id number]) making currently. 01/31/19-Patient returns at 1 week with apparent similarity in dimensions to the wound, with no signs of infection, he has been changing dressings twice a day 02/08/19 upon evaluation today patient actually appears to be doing well with regard to his right Ischial ulcer. The wound is not appear to be quite as deep and seems to be making progress which is good news. With that being said I'm still reluctant to go back to the Wound VAC at this point. He's been having to change the dressings twice a day which is a little bit much in my pinion from the wound care supplies standpoint. I think that possibly attempting to utilize extras orbit may be beneficial this may also help to prevent any additional breakdown secondary to fluid retention in the wound itself. The patient is in agreement with giving this a try. 02/15/19 on evaluation today patient actually appears to be doing decently well with regard to his ulcer in the right to gluteal fold location. He's been tolerating the dressing changes without complication. Fortunately there is no signs of active infection at this time. He is able to keep the current dressing  in place more effectively for a day at a  time whereas before he was having a changes to to three times a day. The actions or has been helpful in this regard. Fortunately there's no signs of anything getting worse and I do feel like he showing signs of good improvement with regard to the wound bed status. 02/22/2019 patient appears to be doing very well today with regard to his ulcer in the gluteal fold. Fortunately there is no signs of active infection and he has been tolerating the dressing changes without any complication. Overall extremely pleased with how things seem to be progressing. He has much less of the hyper granular projections within the wound these have slowly been debrided away and he seems to be doing well. The wound bed is more uniform. 03/01/19 on evaluation today patient appears to be doing unfortunately about the same in regard to his gluteal ulcer. He's been tolerating the dressing changes without complication. Fortunately there's no signs of active infection at this time. With that being said he continues to develop these hyper granular projections which I'm unsure of exactly what they are and why they are rising. Nonetheless I explained to the patient that I do believe it would be a good idea for [place] to stand a biopsy sample for pathology to see if that can shed any light on what exactly may be going on here. Fortunately I do not see any obvious signs of infection. With that being said the patient has had a little bit more drainage this week apparently compared to last week. 03/08/2019 on evaluation today patient actually appears to look somewhat better with regard to the appearance of his wound bed at this time. This is good news. Overall I am very pleased with how things seem to have progressed just in the past week with a switch to the [facility] dressing. I think that has been beneficial for him. With that being said at this time the patient is concerned about his biopsy that  I sent off last week unfortunately I do not have that report as of yet. Nonetheless we have called to obtain this and hopefully will hear back from the lab later this morning. 03/15/19 on evaluation today patient's wound actually appears to be doing okay today with regard to the overall appearance of the wound bed. He has been tolerating the dressing changes without complication he still has hyper granular tissue noted but fortunately that seems to be minimal at this point compared to some of what we've seen in the past. Nonetheless I do think that he is still having some issues currently with some of this type of granulation the biopsy and since all showed nothing more than just evidence of granulation tissue. Therefore there really is nothing different to initiate or do at this point. 03/24/19 on evaluation today patient appears to be doing a little better with regard to his ulcer. He's been tolerating the dressing changes without complication. Fortunately there is no signs of active infection at this time. No fevers, chills, nausea, or vomiting noted at this time. I'm overall pleased with how things seem to be progressing. 03/29/2019 on evaluation today patient appears to be doing about the same in regard to his ulcer in the right gluteal fold. Unfortunately he is not seeming to make a lot of progress and the wound is somewhat stalled. There is no signs of active infection externally but I am concerned about the possibility of infection continuing in the ischial location which previously he did have infection noted. Again is  not able to have an MRI so probably our best option for testing for this would be a triple phase bone scan which will detect subtle changes in the bone more so than plain film x-rays. In the past they really have not been beneficial and in fact the CT scans even have been somewhat questionable at times. Nonetheless there is no signs of systemic infection which is at least good news  but again his wound is not healing at all on the predicted schedule. 04/05/19 on evaluation today patient appears to be doing well all things considering with regard to his wound and the right gluteal fold. He actually has his triple bone scan scheduled for sometime in the next couple of days. With that being said I've also been looking to other possibilities of what could be causing hyper granular tissue were looking into the bone scan again for evaluating for the risk or possibility of infection deeper and I'm also gonna go ahead and see about obtaining a deep tissue culture today to send and see if there's any evidence of infection noted on culture. He's in agreement with that plan. 04/12/2019 on evaluation today patient presents for reevaluation here in our clinic concerning ongoing issues with his right gluteal fold ulcer. 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 It was discussed with him at that point that I think it would be appropriate for him to proceed with hyperbaric oxygen therapy especially in light of the fact that he is previously been on IV antibiotics at the beginning of the year for close to 3 months followed by several weeks of oral antibiotics that was all prescribed by infectious disease. He had surgical debridement around Christmas December 2019 due to an abscess and osteomyelitis of the ischial bone. Unfortunately this has not really proceeded as well as we would have liked and again we did a CT scan even a couple months ago as he cannot have an MRI secondary to having issues with both a pain pump as well as a spinal cord stimulator which prevent him from going into an MRI machine. With that being said there were chronic changes noted at the ischial tuberosity which had progressed since December 2019 there was no evidence of fluid collection on that initial CT  scan. With that being said that was on January 21, 2019. I am not sure that it was a sensitive enough test as compared to an MRI and then subsequently I ordered a bone scan for him which was actually completed on 03/29/2019 and this revealed that he does indeed have positive osteomyelitis involving the right ischial tuberosity. This is adjacent to the ulcer and I think is the reason that his ulcer is not healing. Subsequently I am in a place him back on oral antibiotics today unfortunately his wound culture showed just mixed gram-negative flora with no specific findings of a predominant organism. Nonetheless being with the location I think a good broad-spectrum antibiotic for gram-negative's is a good choice at this point he is previously taken Levaquin without significant issues and I think that is an appropriate antibiotic for him at this time. 04/19/2019 on evaluation today patient actually appears to be doing fairly well with regard to his wound. He has been tolerating the dressing changes without complication. Fortunately there is no signs of active infection and he has been taking the oral antibiotics at this  time. Subsequently we did make a referral to infectious disease although Dr. Steva Ready wants the patient to be seen by general surgery first in order apparently to see if there is anything from a surgical standpoint that should be done prior to initiation of IV antibiotic therapy. Again the patient is okay with actually seeing Dr. Celine Ahr whom he has seen before we will make that referral. 04/26/2019 on evaluation today patient actually appears to be doing well with regard to his wound. He has been tolerating the dressing changes ODARIUS, DINES. ([id number]) without complication. Fortunately there is no signs of active infection at this time. I do believe that the hyperbaric oxygen therapy along with the antibiotics which I prescribed at this point have been doing well for him. With that being  said he has not seen Dr. Celine Ahr the surgeon yet in fact they have not been contacted for scheduling an appointment as of yet. Subsequently the patient is not on antibiotics currently by IV Dr. Steva Ready did want him to see Dr. Celine Ahr first which we are working on trying to get scheduled. We again give the information to the patient today for Dr. Celine Ahr and her number as far as contacting their office to see about getting something scheduled. Again were looking for whether or not they recommend any surgical intervention. 05/03/2019 on evaluation today patient appears to be doing about the same with regard to his wound there may be a little bit of filling in in regard to the base of the wound but still he has quite a bit of hyper granular tissue that I am seeing today. I believe that he may may need a more aggressive sharp debridement possibly even by the surgeon or under anesthesia in order to clear away some of the hyper granular material in order to help allow for appropriate granulation to fill in. We have made a referral to Dr. Celine Ahr unfortunately it sounds as if they did not receive the referral we contacted them today they should be get in touch with the patient. Depending on how things show from that standpoint the patient may need to see Dr. Ardyth Gal who is the infectious disease specialist although he really does not want a PICC line again. No fevers, chills, nausea, vomiting, or diarrhea. He is tolerating hyperbaric oxygen therapy very well. 05/12/2019 on evaluation today patient presents for follow-up visit concerning his right gluteal fold ulcer. He did see Dr. Celine Ahr the surgeon who previously evaluated his wound and she actually felt like he was doing quite well with regard to the wound based on what she was seen. He does seem to be responding to some degree with the oral antibiotics along with hyperbaric oxygen therapy at this point she did not see any evidence or need for further  surgical intervention at this point. She recommended deferring additional or ongoing antibiotic therapy to Dr. Steva Ready at her discretion. Fortunately there is no signs of active systemic infection. No fevers, chills, nausea, vomiting, or diarrhea. 10/29; patient I am seeing really for the first time today in terms of his wound. 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 He is being treated with hyperbaric oxygen for underlying osteomyelitis most recently documented by a three-phase bone scan. He has been on Levaquin for roughly a month and he is out of these and asked me for a refill. Most recent cultures were negative although I do not think these were bone cultures. He has a very irregular surface  to this wound which is almost nodular in texture. It undermines superiorly with the same nodular hyper granulated surface. I see that he was referred to general surgery for an operative debridement although the surgeon did not agree with this I think that would have been the proper course of action in this. He has been using Hydrofera Blue. He is supposed to start a wound VAC tomorrow which is actually a reapplication of wound VAC. I think mostly last time they had trouble keeping the seal in place I hope we have better look at this this time. 05/24/2019 on evaluation today I did review patient's note from Dr. Dellia Nims where he was seen last week when I was out of the office. Subsequently it does appear that Dr. Dellia Nims was in agreement that the patient really needed debridement to clear away some of the nodular tissue in the base of the wound. The good news is he has the wound VAC initiated and some of this tissue is already starting to breakdown under the treatment of the wound VAC again because it is not really viable. Nonetheless I do believe that we can likely perform some debridement today to clear this away and hopefully the continuation of the wound VAC along with hyperbarics  and antibiotic therapy will be beneficial in stopping this from reoccurring. If we get better control in the wound bed in a good spot I think we can definitely use the PriMatrix which we actually did get approval for. 05/31/2019 on evaluation today patient appears to be doing actually pretty well at this point with regard to his wound. He has been tolerating the dressing changes which is good news. Fortunately there is no signs of infection. He still has some of the hyper granular nodules noted we have not really cleared all these away and I think we can try to do a little bit more that today. I did do quite a bit last week I am hoping to get down to the base of the wound so though actually be able to heal more appropriately. I think the wound VAC is doing a good job right now with controlling the drainage and overall the patient is happy with that. 06/07/2019 upon evaluation today patient appears to be doing quite well compared to last week with regard to the hyper granulation in the base of the wound. He has been tolerating the dressing changes without complication. Fortunately there is no signs of active infection at this time. With that being said we have not had any recent measures as far as blood work is concerned I think we may want to check a few things including a CBC, sed rate, and C-reactive protein. Patient is in agreement with that we can try to get that scheduled for him for this Friday. 06/13/2019 on evaluation today patient actually appears to be doing much better at this point based on what I am seeing with regard to his wound. He has much less in the way of hyper granular tissue which is good news and a lot more healing that is taken place since I last saw him as far as the amount of space within the wound itself. With that being said he is nearing the completion of the initial 40 treatments for hyperbaric oxygen therapy I think this is something I would recommend extending as he  does seem to be benefiting at this time. Fortunately there is no evidence of active systemic infection at this point nor even local infection for  that matter. 06/20/2019 upon evaluation today patient actually appears to be doing well with regard to his wound. In fact this appears to be doing much better today compared to what it was previous. I been extremely pleased with the progress that he is made. In fact the tunnel is much less deep today than what it was in the past and I am seeing excellent signs of improvement. Overall I am happy with how things are going. I did review his white blood cell count which revealed everything to be okay. The one abnormal finding on his CBC was a hemoglobin of 12.7. Subsequently I did also review his sed rate and C-reactive protein. His sed rate measured in at normal level measuring at 26. The C-reactive protein however was elevated today and an abnormal range indicating inflammation. Still I feel like he is trending in a good direction at this time. He is going to need a refill of the Levaquin he tells me at this point. 07/04/2019 on evaluation today patient actually appears to be doing well in regard to his wound. He has been tolerating the dressing changes without complication. Fortunately there is no signs of active infection which is good news. No fevers, chills, nausea, vomiting, or diarrhea. The good news also is that I did review his labs and though his sed rate was slightly elevated the C-reactive protein was actually down compared to the last evaluation 2 weeks ago this is excellent news I think it is showing signs of improvement. 07/11/2019 on evaluation today patient actually appears to be doing quite well with regard to his wound on my evaluation. This is very slow going but nonetheless he seems to be making progress. Especially in regard to the depth. Nonetheless I believe that we may want to consider going forward with the PriMatrix which was  previously approved for him. We will get a have to get a new approval as we will actually be applying this in the new year so we will likely obtain that approval on January 4. In the meantime and for the time being my suggestion is going to be that we go ahead and continue with the wound VAC and we were going at this point with the current wound care measures. 12/28-Patient seen after HBO session today, wound depth with the tunnel measures 3.2 cm, the wound bed appears healthy, continuing with the VAC and HBO 07/25/2019 on evaluation today patient's wound appears to be doing about the same compared to my last evaluation. Fortunately there is no evidence of significant infection which is good news we will still waiting on the results of his lab work which unfortunately was not done prior to today's visit which is what we were aiming for. Ultimately there is no sign of active infection at this time which is good news. And he also tells me that the wound VAC has been staying in place and doing fairly well which is also good news. With that being said I am concerned about the fact TAEDYN, GLASSCOCK. ([id number]) that the wound has not progressed as much I think we may want to look into getting reapproval for the skin substitute, specifically PriMatrix, that I am hopeful will help to allow this area to granulate in more effectively. I would recommend as well continuing with the wound VAC along with this. 08/01/2019 on evaluation today patient appears to be doing in my opinion slightly better in regard to the wound. He actually does have some tightening of the walls  around the edges which is a good thing. Fortunately there is no signs of active infection at this time. No fever chills noted. I do believe that the patient in general seems to be making progress although is very slow. Obviously were trying to get things to speed up somewhat more. No fevers, chills, nausea, vomiting, or diarrhea. 08/08/2019 upon  evaluation today patient appears to be doing well with regard to his wound although there is not a significant improvement overall we do have the PriMatrix for availability today to apply. Fortunately there is no signs of active infection and the patient overall seems to be doing well. There is really no need for aggressive sharp debridement at this point either which is also good news. I do believe her get a need to suture and the primary checks in the basin in the PriMatrix in order to keep it in place at the base of the wound underneath the wound VAC. This was discussed with the patient today as well. He is in agreement the plan. He really does not have any filling in the area and so this should not be a complication as far as suturing is concerned. 08/15/2019 on evaluation today patient appears to be doing somewhat better in my opinion in regard to the overall appearance of his wound. He has been tolerating the dressing changes without complication. Fortunately the skin subappear to still be in place when this was changed on Friday. With that being said it appears that the PriMatrix may have dissolved in the interim between now and then as the sutures are no longer holding anything in place and again I would sutured it into place. Nonetheless I believe that we can go ahead and likely consider going forward that we may need to apply the PriMatrix weekly as opposed every other week as it does seem to have dissolved rather readily and well. Overall the wound bed appears to be doing excellent at this time. 08/22/2019 upon evaluation today the patient does seem to making some progress with regard to his wound. Overall there is some better quality tissue and less of the hyper granular protrusions all of which is good news. With that being said we do have the PriMatrix available for application today and I am in a try to find a better way to suture this in place for him today. 08/29/2019 on evaluation today  patient appears to be doing very well in regard to his wound. I do see signs of new granulation which seems to be somewhat helpful he did have some need for sharp debridement today to clear some of the hyper granulation away but nonetheless I feel like we are headed in a good direction. Fortunately there is no evidence of any systemic infection although we are going to go ahead and reorder the CBC, C-reactive protein, and sed rate today to see where things stand I did just refill his Levaquin as well. 09/05/2019 upon evaluation today patient's wound actually is not appearing to be showing signs of significant improvement compared to what we have seen previous from the standpoint of depth. He overall wound size wise seems to be about the same and again some of the quality of the wound bed is still doing fairly well. I did review his labs and his CBC was normal with no signs of elevation white blood cell count, his C-reactive protein was normal, and the sed rate was slightly elevated but again I think this may just be due to ulceration  really there is nothing significant to indicate severe infection. I think when he likely finishes his current round of oral antibiotics with Levaquin will likely take him off of the medication at that point based on the findings. I do feel like the hyperbarics helped in getting the infection under control as well as improving the overall size of the wound quite significantly. With that being said at this point I really feel like more more at the time frame that he may benefit from a muscle flap in order to try to help this wound heal effectively. I think that this would be the fastest way in order to do this. In the past he has been somewhat resistant to going down that path but nonetheless I feel like he likely should. 09/12/2019 on evaluation today patient appears to be doing fairly well in regard to his wound. He is tolerating the wound VAC without complication is still  continues to drain significantly. Fortunately there is no signs of active infection at this time which is great news his lab work is also been doing very well. With that being said I do think at this point that the patient is still deriving benefit from the wound VAC especially in regard to controlling moisture which I think it is doing fairly well. He also has some improvement in the overall wound bed and on the medial aspect of the wound there are some hyper granular projections but nothing as severe as what they have been in the past. I am good have to perform some sharp debridement at this location today. 09/19/2019 upon evaluation today patient appears to be doing roughly the same although again the tissue does seem to be showing some signs of improvement which is good news. There is less hyper granular budding. Nonetheless he still is going require some sharp debridement today. We did get his albumin and prealbumin results back and they are within normal limits in both cases which is excellent news that was the one thing [facility] plastic surgery was waiting on order to get him scheduled. He does seem to be a good candidate physically for the flap surgery if he decides to go that direction. With that being said is still left to be determined once they see the wound what they feel about how best to proceed. Obviously in the end it would be up to Mr. Stebner as to whether he goes forward with the flap surgery or something else depending on the recommendations. 09/26/2019 upon evaluation today patient appears to be doing about the same in regard to his wound. Fortunately there is no signs of active infection there is also not as much hyper granulation noted today in fact I do not believe there is can be any sharp debridement required at this point. With that being said I do believe he continues to be somewhat stalled at this point with regard to his wound. There is no signs of active infection at this time  which is good news. He does have an appointment with [facility] plastic surgery on Friday. 10/03/2019 upon evaluation today patient appears to be doing somewhat poorly especially in his overall demeanor compared to last week's evaluation. He unfortunately did see the physician at [facility] plastic surgery. Unfortunately the plan for the possibility of a flap would be a fairly significant surgery that the patient is really not wanting to take the risk of as if the surgery failed he would end up with a very large open wound at that  point. Fortunately there is no signs of active infection at this time. No fevers, chills, nausea, vomiting, or diarrhea. With that being said there was some question that the physician had about why this would not be healing and the possibility that there may be some bone still needs to be removed deeper in the wound. Again the bone scan that we did previous actually did show evidence of there still being a chronic osteomyelitis back on March 29, 2019. With that being said he did undergo treatment both with hyperbarics as well as oral antibiotics for quite some time since that point. Obviously he may need additional and updated imaging with the surgeon in order to see if there is anything else that can or will need to be done in order to help this area to heal. Obviously I still believe as well that there is something going on here. He is already seeing the surgeon that performed the original surgery after the bone scan but they did not feel that anything needed to be done from a surgical standpoint at that time. 10/10/2019 upon evaluation today patient appears to be really doing about the same with regard to his wound in the gluteal fold region. Unfortunately he is not making a lot of improvement the insurance also contacted home health and apparently the wound VAC is no longer to be covered due to lack of progress. Obviously that is something that we discussed as well as a strong  possibility for happening soon and this was even last week that discussed this with him. With that being said again we have tried to manage this wound as best as I could up to this point. Fortunately there is no evidence of active infection at this time. No fevers, chills, nausea, vomiting, or diarrhea. LEONIDAS, BOATENG ([id number]) 10/17/2019 upon evaluation today patient has decided to proceed with the flap surgery. He actually seems to be in better spirits today he states he wants to get this healed and after talking to the surgeon last week he feels like that is his best option for doing so. With that being said he also needs to have a referral to infectious disease to be seen for IV antibiotics preoperatively and postoperatively as far as the skin flap is concerned. He wonders if Dr. Steva Ready would be interested or available to do this for him. I explained that I would definitely have to check with her and I will be happy to do that but I am not certain What her answer will be in 2 I have a discussion with her. 10/24/2019 upon evaluation today patient appears to be doing about the same with regard to his wound although he is complaining of increased pain he is really not had any discomfort he states that it is getting much worse at this point. Fortunately there is no signs of active infection. At least not systemically. With that being said I question whether local infection could be a problem here secondary to the fact that to be honest the patient is having pain where he has not previous. 10/31/2019 upon evaluation today patient appears to be doing about the same in regard to his wound in the right gluteal fold region. Subsequently in the interim since I last saw him last week I did receive a message from Dr. Steva Ready as well as reviewed her note. She was requesting the potential for a bone culture or at least a deep tissue culture in order to see about helping with more  directed antibiotic  therapy for the patient's projected flap surgery. If we could not obtain more specific organisms for treatment then obviously should be looking at more broad-spectrum antibiotic therapy. With that being said at this point the patient seems ready to move forward with the flap surgery. I did actually sign the paperwork for his bed which came through last week I believe that was on Thursday or Friday. Either way that should be coming to him as well so he will have that in place once the surgery occurs when he is discharged home. Fortunately there is no signs of active infection at this time. No fevers, chills, nausea, vomiting, or diarrhea. 11/15/2019 upon evaluation today patient appears to be doing about the same with regard to his right gluteal fold ulcer. He has been tolerating the dressing changes without complication right now he is awaiting the hospital bed with air mattress in order to be able to be scheduled for his flap surgery. He is on IV vancomycin at this point is managed by Dr. Steva Ready. 11/30/2019; right gluteal fold extending over the ischial tuberosity. He had underlying osteomyelitis and there is still managing on vancomycin directed by infectious disease. His hospital bed and level 3 surface are arriving. After that there is plans for a myocutaneous flap at [facility] They state that he will be in hospital for 2 weeks and for at least 2 weeks in a skilled facility may be longer Objective Constitutional Patient is hypertensive.. Pulse regular and within target range for patient.Marland Kitchen Respirations regular, non-labored and within target range.. Temperature is normal and within the target range for the patient.Marland Kitchen appears in no distress. Vitals Time Taken: 8:06 AM, Height: 73 in, Weight: 210 lbs, BMI: 27.7, Temperature: 98.6 F, Pulse: 75 bpm, Respiratory Rate: 16 breaths/min, Blood Pressure: 160/81 mmHg. Psychiatric Patient appears depressed today.. General Notes: Wound exam;  granulation tissue looks marginal. No debridement. Large undermining area. Hyper granulated areas as well. No bone is palpable. There is no evidence of surrounding soft tissue infection. Integumentary (Hair, Skin) No surrounding erythema. Wound #1 status is Open. Original cause of wound was Pressure Injury. The wound is located on the Right Gluteal fold. The wound measures 3.4cm length x 2cm width x 2.4cm depth; 5.341cm^2 area and 12.818cm^3 volume. There is Fat Layer (Subcutaneous Tissue) Exposed exposed. There is no tunneling or undermining noted. There is a large amount of serous drainage noted. The wound margin is epibole. There is large (67- 100%) pink, hyper - granulation within the wound bed. There is a small (1-33%) amount of necrotic tissue within the wound bed including Adherent Slough. 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 ([id number]) Plan Wound Cleansing: Wound #1 Right Gluteal fold: Clean wound with Normal Saline. - in office Cleanse wound with mild soap and water Anesthetic (add to Medication List): Wound #1 Right Gluteal fold: Topical Lidocaine 4% cream applied to wound bed prior to debridement (In Clinic Only). Skin Barriers/Peri-Wound Care: Skin Prep Primary Wound Dressing: Wound #1 Right Gluteal fold: Silver Alginate Secondary Dressing: Wound #1 Right Gluteal fold: Tegaderm XtraSorb Dressing Change Frequency: Wound #1 Right Gluteal fold: Change dressing every other day. Follow-up Appointments: Wound #1 Right Gluteal fold: Return Appointment in 2 weeks. Off-Loading: Wound #1 Right Gluteal fold: Turn and reposition every 2 hours - no pressure on wounded area  Home Health: Wound #1 Right Gluteal fold: Continue  Home Health Visits - [place] Nurse may visit PRN to address patient s wound care needs. FACE TO FACE ENCOUNTER: MEDICARE and MEDICAID PATIENTS: I certify that this patient is under my care and that I had a face-to-face encounter that meets the physician face-to-face encounter requirements with this patient on this date. The encounter with the patient was in whole or in part for the following MEDICAL CONDITION: (primary reason for [place]) MEDICAL NECESSITY: I certify, that based on my findings, NURSING services are a medically necessary home health service. HOME BOUND STATUS: I certify that my clinical findings support that this patient is homebound (i.e., Due to illness or injury, pt requires aid of supportive devices such as crutches, cane, wheelchairs, walkers, the use of special transportation or the assistance of another person to leave their place of residence. There is a normal inability to leave the home and doing so requires considerable and taxing effort. Other absences are for medical reasons / religious services and are infrequent or of short duration when for other reasons). If current dressing causes regression in wound condition, may D/C ordered dressing product/s and apply Normal Saline Moist Dressing daily until next [place] / Other MD appointment. [place] of regression in wound condition at [phone]. Please direct any NON-WOUND related issues/requests for orders to patient's Primary Care Physician 1. Very refractory stage IV pressure wound on the right gluteal. 2. Underlying osteomyelitis still on vancomycin as directed by infectious disease 3. Plans for a myocutaneous flap at [facility] should be fairly shortly i.e. in the next few weeks Electronic Signature(s) Signed: 12/01/2019 7:53:18 AM By: Linton Ham MD Entered By: Linton Ham on 11/30/2019 09:04:29 GUNNARD, DORRANCE  ([id number]) -------------------------------------------------------------------------------- SuperBill Details Patient Name: [name] Date of Service: 11/30/2019 Medical Record Number: [id number] Patient Account Number: [id number] Date of Birth/Sex: [birth year] (69 y.o. 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

## 2020-01-17 NOTE — Progress Notes (Signed)
MICHAIL, BOYTE ([id number]) Visit Report for 11/30/2019 Arrival Information Details Patient Name: [name], [name] Date of Service: 11/30/2019 8:00 AM Medical Record Number: [id number] Patient Account Number: [id number] Date of Birth/Sex: August 06, 1950 (69 y.o. M) Treating RN: Army Melia Primary Care Tanaya Dunigan: [name] Other Clinician: Referring Mathias Bogacki: [name] Treating Jehad Bisono/Extender: [name] in Treatment: 104 Visit Information History Since Last Visit Added or deleted any medications: No Patient Arrived: Wheel Chair Any new allergies or adverse reactions: No Arrival Time: 08:06 Had a fall or experienced change in No Accompanied By: wife activities of daily living that may affect Transfer Assistance: None risk of falls: Patient Identification Verified: Yes Signs or symptoms of abuse/neglect since last visito No Patient Requires Transmission-Based Precautions: No Hospitalized since last visit: No Patient Has Alerts: Yes Has Dressing in Place as Prescribed: Yes Patient Alerts: NOT Diabetic Pain Present Now: Yes Electronic Signature(s) Signed: 11/30/2019 11:15:40 AM By: Army Melia Entered By: Army Melia on 11/30/2019 08:06:37 [name] ([id number]) -------------------------------------------------------------------------------- Clinic Level of Care Assessment Details Patient Name: [name] Date of Service: 11/30/2019 8:00 AM Medical Record Number: [id number] Patient Account Number: [id number] Date of Birth/Sex: 10/12/1950 (69 y.o. M) Treating RN: [name] Primary Care Chales Pelissier: [name] Other Clinician: Referring Cleva Camero: [name] Treating Ramonita Koenig/Extender: [name] in Treatment: 104 Clinic Level of Care Assessment Items TOOL 4 Quantity Score []  - Use when only an EandM is performed on FOLLOW-UP visit 0 ASSESSMENTS - Nursing Assessment / Reassessment X - Reassessment of  Co-morbidities (includes updates in patient status) 1 10 X- 1 5 Reassessment of Adherence to Treatment Plan ASSESSMENTS - Wound and Skin Assessment / Reassessment X - Simple Wound Assessment / Reassessment - one wound 1 5 []  - 0 Complex Wound Assessment / Reassessment - multiple wounds []  - 0 Dermatologic / Skin Assessment (not related to wound area) ASSESSMENTS - Focused Assessment []  - Circumferential Edema Measurements - multi extremities 0 []  - 0 Nutritional Assessment / Counseling / Intervention []  - 0 Lower Extremity Assessment (monofilament, tuning fork, pulses) []  - 0 Peripheral Arterial Disease Assessment (using hand held doppler) ASSESSMENTS - Ostomy and/or Continence Assessment and Care []  - Incontinence Assessment and Management 0 []  - 0 Ostomy Care Assessment and Management (repouching, etc.) PROCESS - Coordination of Care X - Simple Patient / Family Education for ongoing care 1 15 []  - 0 Complex (extensive) Patient / Family Education for ongoing care X- 1 10 Staff obtains Programmer, systems, Records, Test Results / Process Orders []  - 0 Staff telephones HHA, Nursing Homes / Clarify orders / etc []  - 0 Routine Transfer to another Facility (non-emergent condition) []  - 0 Routine Hospital Admission (non-emergent condition) []  - 0 New Admissions / Biomedical engineer / Ordering NPWT, Apligraf, etc. []  - 0 Emergency Hospital Admission (emergent condition) X- 1 10 Simple Discharge Coordination []  - 0 Complex (extensive) Discharge Coordination PROCESS - Special Needs []  - Pediatric / Minor Patient Management 0 []  - 0 Isolation Patient Management []  - 0 Hearing / Language / Visual special needs []  - 0 Assessment of Community assistance (transportation, D/C planning, etc.) []  - 0 Additional assistance / Altered mentation []  - 0 Support Surface(s) Assessment (bed, cushion, seat, etc.) INTERVENTIONS - Wound Cleansing / Measurement [name], [name]  ([id number]) X- 1 5 Simple Wound Cleansing - one wound []  - 0 Complex Wound Cleansing - multiple wounds X- 1 5 Wound Imaging (photographs - any number of wounds) []  - 0 Wound Tracing (  instead of photographs) X- 1 5 Simple Wound Measurement - one wound []  - 0 Complex Wound Measurement - multiple wounds INTERVENTIONS - Wound Dressings []  - Small Wound Dressing one or multiple wounds 0 []  - 0 Medium Wound Dressing one or multiple wounds X- 1 20 Large Wound Dressing one or multiple wounds []  - 0 Application of Medications - topical []  - 0 Application of Medications - injection INTERVENTIONS - Miscellaneous []  - External ear exam 0 []  - 0 Specimen Collection (cultures, biopsies, blood, body fluids, etc.) []  - 0 Specimen(s) / Culture(s) sent or taken to Lab for analysis []  - 0 Patient Transfer (multiple staff / Civil Service fast streamer / Similar devices) []  - 0 Simple Staple / Suture removal (25 or less) []  - 0 Complex Staple / Suture removal (26 or more) []  - 0 Hypo / Hyperglycemic Management (close monitor of Blood Glucose) []  - 0 Ankle / Brachial Index (ABI) - do not check if billed separately X- 1 5 Vital Signs Has the patient been seen at the hospital within the last three years: Yes Total Score: 95 Level Of Care: New/Established - Level 3 Electronic Signature(s) Signed: 01/17/2020 12:53:52 PM By: Gretta Cool, BSN, RN, CWS, Kim RN, BSN Entered By: Gretta Cool, BSN, RN, CWS, Kim on 11/30/2019 08:33:04 [name], [name] ([id number]) -------------------------------------------------------------------------------- Encounter Discharge Information Details Patient Name: [name] Date of Service: 11/30/2019 8:00 AM Medical Record Number: [id number] Patient Account Number: [id number] Date of Birth/Sex: [birth year] (69 y.o. M) Treating RN: [name] Primary Care Adam Demary: [name] Other Clinician: Referring Darrek Leasure: [name] Treating Jowanda Heeg/Extender: [name] in Treatment: 104 Encounter Discharge Information Items Discharge Condition: Stable Ambulatory Status: Wheelchair Discharge Destination: Home Transportation: Other Accompanied By: wife Schedule Follow-up Appointment: Yes Clinical Summary of Care: Electronic Signature(s) Signed: 01/17/2020 12:53:52 PM By: Gretta Cool, BSN, RN, CWS, Kim RN, BSN Entered By: Gretta Cool, BSN, RN, CWS, Kim on 11/30/2019 08:34:10 [name], [name] ([id number]) -------------------------------------------------------------------------------- Lower Extremity Assessment Details Patient Name: [name] Date of Service: 11/30/2019 8:00 AM Medical Record Number: [id number] Patient Account Number: [id number] Date of Birth/Sex: 1951/07/16 (69 y.o. M) Treating RN: Army Melia Primary Care Bo Rogue: [name] Other Clinician: Referring Deairra Halleck: [name] Treating Elwood Bazinet/Extender: Ricard Dillon Weeks in Treatment: 104 Electronic Signature(s) Signed: 11/30/2019 11:15:40 AM By: Army Melia Entered By: Army Melia on 11/30/2019 08:11:31 [name], [name] ([id number]) -------------------------------------------------------------------------------- Multi Wound Chart Details Patient Name: [name] Date of Service: 11/30/2019 8:00 AM Medical Record Number: [id number] Patient Account Number: [id number] Date of Birth/Sex: 02-06-1951 (69 y.o. M) Treating RN: [name] Primary Care Marzelle Rutten: [name] Other Clinician: Referring Shirline Kendle: [name] Treating Neysa Arts/Extender: [name] in Treatment: 104 Vital Signs Height(in): 73 Pulse(bpm): 75 Weight(lbs): 210 Blood Pressure(mmHg): 160/81 Body Mass Index(BMI): 28 Temperature(F): 98.6 Respiratory Rate(breaths/min): 16 Photos: [N/A:N/A] Wound Location: Right Gluteal fold N/A N/A Wounding Event: Pressure Injury N/A N/A Primary Etiology: Pressure Ulcer N/A N/A Comorbid History: Cataracts,  Hypertension, Paraplegia N/A N/A Date Acquired: 11/02/2017 N/A N/A Weeks of Treatment: 104 N/A N/A Wound Status: Open N/A N/A Measurements L x W x D (cm) 3.4x2x2.4 N/A N/A Area (cm) : 5.341 N/A N/A Volume (cm) : 12.818 N/A N/A % Reduction in Area: 46.90% N/A N/A % Reduction in Volume: -1175.40% N/A N/A Classification: Category/Stage IV N/A N/A Exudate Amount: Large N/A N/A Exudate Type: Serous N/A N/A Exudate Color: amber N/A N/A Wound Margin: Epibole N/A N/A Granulation Amount: Large (67-100%) N/A N/A Granulation Quality: Pink, Hyper-granulation N/A  N/A Necrotic Amount: Small (1-33%) N/A N/A Exposed Structures: Fat Layer (Subcutaneous Tissue) N/A N/A Exposed: Yes Fascia: No Tendon: No Muscle: No Joint: No Bone: No Epithelialization: Small (1-33%) N/A N/A Treatment Notes Wound #1 (Right Gluteal fold) Notes S.cell, xsorb, tegaderm Electronic Signature(s) Signed: 12/01/2019 7:53:18 AM By: Linton Ham MD [name], [name] ([id number]) Entered By: Linton Ham on 11/30/2019 08:51:21 [name], [name] ([id number]) -------------------------------------------------------------------------------- [facility] Details Patient Name: [name] Date of Service: 11/30/2019 8:00 AM Medical Record Number: [id number] Patient Account Number: [id number] Date of Birth/Sex: 1950/09/03 (69 y.o. M) Treating RN: [name] Primary Care Jewelene Mairena: [name] Other Clinician: Referring Cristino Degroff: [name] Treating Nalaysia Manganiello/Extender: [name] in Treatment: 104 Active Inactive Osteomyelitis Nursing Diagnoses: Infection: osteomyelitis Goals: Patient's osteomyelitis will resolve Date Initiated: 08/29/2019 Target Resolution Date: 09/28/2019 Goal Status: Active Signs and symptoms for osteomyelitis will be recognized and promptly addressed Date Initiated: 08/29/2019 Target Resolution Date: 09/28/2019 Goal Status:  Active Interventions: Assess for signs and symptoms of osteomyelitis resolution every visit Treatment Activities: Systemic antibiotics : 08/29/2019 Notes: Pressure Nursing Diagnoses: Knowledge deficit related to management of pressures ulcers Goals: Patient/caregiver will verbalize understanding of pressure ulcer management Date Initiated: 05/17/2018 Target Resolution Date: 05/28/2018 Goal Status: Active Interventions: Assess: immobility, friction, shearing, incontinence upon admission and as needed Notes: Wound/Skin Impairment Nursing Diagnoses: Impaired tissue integrity Goals: Patient/caregiver will verbalize understanding of skin care regimen Date Initiated: 11/30/2017 Target Resolution Date: 12/21/2017 Goal Status: Active Ulcer/skin breakdown will have a volume reduction of 30% by week 4 Date Initiated: 11/30/2017 Target Resolution Date: 12/21/2017 Goal Status: Active Interventions: Assess patient/caregiver ability to obtain necessary supplies Assess patient/caregiver ability to perform ulcer/skin care regimen upon admission and as needed Assess ulceration(s) every visit [name], [name] ([id number]) Treatment Activities: Skin care regimen initiated : 11/30/2017 Notes: Electronic Signature(s) Signed: 01/17/2020 12:53:52 PM By: Gretta Cool, BSN, RN, CWS, Kim RN, BSN Entered By: Gretta Cool, BSN, RN, CWS, Kim on 11/30/2019 08:31:18 [name], [name] ([id number]) -------------------------------------------------------------------------------- Pain Assessment Details Patient Name: [name] Date of Service: 11/30/2019 8:00 AM Medical Record Number: [id number] Patient Account Number: [id number] Date of Birth/Sex: 07-Aug-1950 (69 y.o. M) Treating RN: Army Melia Primary Care Thula Stewart: [name] Other Clinician: Referring Mariselda Badalamenti: [name] Treating Eulas Schweitzer/Extender: [name] in Treatment: 104 Active Problems Location of Pain Severity and  Description of Pain Patient Has Paino Yes Site Locations Pain Location: Pain in Ulcers Rate the pain. Current Pain Level: 5 Pain Management and Medication Current Pain Management: Electronic Signature(s) Signed: 11/30/2019 11:15:40 AM By: Army Melia Entered By: Army Melia on 11/30/2019 08:07:16 [name], [name] ([id number]) -------------------------------------------------------------------------------- Patient/Caregiver Education Details Patient Name: [name] Date of Service: 11/30/2019 8:00 AM Medical Record Number: [id number] Patient Account Number: [id number] Date of Birth/Gender: [birth year] (69 y.o. M) Treating RN: [name] Primary Care Physician: [name] Other Clinician: Referring Physician: [name] Treating Physician/Extender: [name] in Treatment: 104 Education Assessment Education Provided To: Patient Education Topics Provided Venous: Handouts: Controlling Swelling with Compression Stockings Methods: Demonstration, Explain/Verbal Responses: State content correctly Wound/Skin Impairment: Handouts: Caring for Your Ulcer Methods: Demonstration, Explain/Verbal Responses: State content correctly Electronic Signature(s) Signed: 01/17/2020 12:53:52 PM By: Gretta Cool, BSN, RN, CWS, Kim RN, BSN Entered By: Gretta Cool, BSN, RN, CWS, Kim on 11/30/2019 08:33:29 [name], [name] ([id number]) -------------------------------------------------------------------------------- Wound Assessment Details Patient Name: [name] Date of Service: 11/30/2019 8:00 AM Medical Record Number: [id number] Patient Account Number: [id number] Date of Birth/Sex: February 11, 1951 (69 y.o. M)  Treating RN: Army Melia Primary Care Rykker Coviello: [name] Other Clinician: Referring Hoda Hon: [name] Treating Chancy Smigiel/Extender: [name] in Treatment: 104 Wound Status Wound Number: 1 Primary Etiology: Pressure  Ulcer Wound Location: Right Gluteal fold Wound Status: Open Wounding Event: Pressure Injury Comorbid History: Cataracts, Hypertension, Paraplegia Date Acquired: 11/02/2017 Weeks Of Treatment: 104 Clustered Wound: No Photos Wound Measurements Length: (cm) 3.4 Width: (cm) 2 Depth: (cm) 2.4 Area: (cm) 5.341 Volume: (cm) 12.818 % Reduction in Area: 46.9% % Reduction in Volume: -1175.4% Epithelialization: Small (1-33%) Tunneling: No Undermining: No Wound Description Classification: Category/Stage IV Wound Margin: Epibole Exudate Amount: Large Exudate Type: Serous Exudate Color: amber Foul Odor After Cleansing: No Slough/Fibrino Yes Wound Bed Granulation Amount: Large (67-100%) Exposed Structure Granulation Quality: Pink, Hyper-granulation Fascia Exposed: No Necrotic Amount: Small (1-33%) Fat Layer (Subcutaneous Tissue) Exposed: Yes Necrotic Quality: Adherent Slough Tendon Exposed: No Muscle Exposed: No Joint Exposed: No Bone Exposed: No Electronic Signature(s) Signed: 11/30/2019 11:15:40 AM By: Army Melia Entered By: Army Melia on 11/30/2019 08:17:41 [name] ([id number]) -------------------------------------------------------------------------------- Vitals Details Patient Name: [name] Date of Service: 11/30/2019 8:00 AM Medical Record Number: [id number] Patient Account Number: [id number] Date of Birth/Sex: 1950-12-07 (69 y.o. M) Treating RN: Army Melia Primary Care Malvika Tung: [name] Other Clinician: Referring Rhegan Trunnell: [name] Treating Trenity Pha/Extender: [name] in Treatment: 104 Vital Signs Time Taken: 08:06 Temperature (F): 98.6 Height (in): 73 Pulse (bpm): 75 Weight (lbs): 210 Respiratory Rate (breaths/min): 16 Body Mass Index (BMI): 27.7 Blood Pressure (mmHg): 160/81 Reference Range: 80 - 120 mg / dl Electronic Signature(s) Signed: 11/30/2019 11:15:40 AM By: Army Melia Entered By:  Army Melia on 11/30/2019 08:06:57

## 2020-01-31 ENCOUNTER — Telehealth: Payer: Self-pay

## 2020-02-03 ENCOUNTER — Telehealth: Payer: Self-pay

## 2020-02-03 NOTE — Telephone Encounter (Signed)
LM giving verbal for picc line dressing changes and labs weekly.

## 2020-02-14 ENCOUNTER — Telehealth: Payer: Self-pay

## 2020-02-14 NOTE — Telephone Encounter (Signed)
Spoke to patient who states he will remain at [facility] for at least one more week. Advised to call us upon D/C so we can schedule his Hospital Follow up and take over orders for [facility].

## 2020-03-13 ENCOUNTER — Encounter: Payer: Self-pay | Admitting: Infectious Diseases

## 2020-03-13 ENCOUNTER — Ambulatory Visit: Payer: Medicare Other | Attending: Infectious Diseases | Admitting: Infectious Diseases

## 2020-03-13 ENCOUNTER — Other Ambulatory Visit: Payer: Self-pay

## 2020-03-13 VITALS — BP 165/91 | HR 57 | Resp 16 | Ht 73.0 in | Wt 190.0 lb

## 2020-03-13 DIAGNOSIS — Z9689 Presence of other specified functional implants: Secondary | ICD-10-CM | POA: Diagnosis not present

## 2020-03-13 DIAGNOSIS — Z9682 Presence of neurostimulator: Secondary | ICD-10-CM | POA: Insufficient documentation

## 2020-03-13 DIAGNOSIS — Z79899 Other long term (current) drug therapy: Secondary | ICD-10-CM | POA: Insufficient documentation

## 2020-03-13 DIAGNOSIS — I10 Essential (primary) hypertension: Secondary | ICD-10-CM | POA: Insufficient documentation

## 2020-03-13 DIAGNOSIS — M86651 Other chronic osteomyelitis, right thigh: Secondary | ICD-10-CM | POA: Diagnosis not present

## 2020-03-13 DIAGNOSIS — A4902 Methicillin resistant Staphylococcus aureus infection, unspecified site: Secondary | ICD-10-CM

## 2020-03-13 DIAGNOSIS — N319 Neuromuscular dysfunction of bladder, unspecified: Secondary | ICD-10-CM | POA: Diagnosis not present

## 2020-03-13 DIAGNOSIS — Z881 Allergy status to other antibiotic agents status: Secondary | ICD-10-CM | POA: Diagnosis not present

## 2020-03-13 DIAGNOSIS — L89899 Pressure ulcer of other site, unspecified stage: Secondary | ICD-10-CM | POA: Diagnosis not present

## 2020-03-13 DIAGNOSIS — C61 Malignant neoplasm of prostate: Secondary | ICD-10-CM | POA: Diagnosis present

## 2020-03-13 DIAGNOSIS — Z8614 Personal history of Methicillin resistant Staphylococcus aureus infection: Secondary | ICD-10-CM | POA: Insufficient documentation

## 2020-03-13 DIAGNOSIS — Z880 Allergy status to penicillin: Secondary | ICD-10-CM | POA: Insufficient documentation

## 2020-03-13 DIAGNOSIS — L89314 Pressure ulcer of right buttock, stage 4: Secondary | ICD-10-CM

## 2020-03-13 DIAGNOSIS — Z87442 Personal history of urinary calculi: Secondary | ICD-10-CM | POA: Insufficient documentation

## 2020-03-13 DIAGNOSIS — Z882 Allergy status to sulfonamides status: Secondary | ICD-10-CM | POA: Diagnosis not present

## 2020-03-13 NOTE — Progress Notes (Signed)
NAME: [name]  DOB: Feb 23, 1951  MRN: [id number]  Date/Time: 03/13/2020 10:33 AM   Subjective:  Follow up visit  [name] is a 69 y.o. 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 )  Followed by [name].   Pt had ulcer excision and rt posterior thigh advancement flapplastic surgery for closing his decubitus ulcer on the rt ischial  area at [facility] by Dr. Raiford Simmonds on 01/03/20-bone culture had MRSA and hence he was given 6 more weeks of vanco. He had received 6 weeks prior to the surgery as well Went to rehab on 02/03/20-02/29/20 Back home , Wound has healed completely. He had follow up with plastic surgeon on 01/27/20 He is getting PT ?  Past Medical History:  Diagnosis Date  . AVM (arteriovenous malformation) spine   . Cancer [facility])    prostate, taking Casodex (hormone chemotherapy)  . History of kidney stones    several times  . Hypertension   . Paralysis ([place]) 2005   lower extrems from lumbar surgery  . Prostate cancer ([place])   . Status post insertion of intrathecal baclofen pump   . Status post insertion of spinal cord stimulator     Past Surgical History:  Procedure Laterality Date  . COLONOSCOPY    . CYSTOSCOPY     several times  . CYSTOSCOPY WITH DIRECT VISION INTERNAL URETHROTOMY  05/05/2017   Procedure: CYSTOSCOPY WITH DIRECT VISION INTERNAL URETHROTOMY;  Surgeon: Royston Cowper, MD;  Location: [facility];  Service: Urology;;  . Consuela Mimes WITH LITHOLAPAXY N/A 05/05/2017   Procedure: CYSTOSCOPY WITH LITHOLAPAXY;  Surgeon: Royston Cowper, MD;  Location: [facility];  Service: Urology;  Laterality: N/A;  . EXTRACORPOREAL SHOCK WAVE LITHOTRIPSY    . EYE SURGERY Bilateral    cataract surgery  . HOLMIUM  LASER APPLICATION N/A 99/24/2683   Procedure: HOLMIUM LASER APPLICATION;  Surgeon: Royston Cowper, MD;  Location: [facility];  Service: Urology;  Laterality: N/A;  . [place]  . LUMBAR [place] SURGERY  2005   resulted in paralysis of lower extrems  . SKIN DEBRIDEMENT Right 07/15/2018   Procedure: DEBRIDEMENT SKIN FULL THICKNESS--stage 4 decubitus ulcer;  Surgeon: Fredirick Maudlin, MD;  Location: [facility];  Service: General;  Laterality: Right;    Social History   Socioeconomic History  . Marital status: Married    Spouse name: Not on file  . Number of children: Not on file  . Years of education: Not on file  . Highest education level: Not on file  Occupational History  . Not on file  Tobacco Use  . Smoking status: Never Smoker  . Smokeless tobacco: Former Systems developer    Types: Snuff  Vaping Use  . Vaping Use: Never used  Substance and Sexual Activity  . Alcohol use: No  . Drug use: No  . Sexual activity: Not on file  Other Topics Concern  . Not on file  Social History Narrative  . Not on file   Social Determinants of Health   Financial Resource Strain:   . Difficulty of Paying Living Expenses: Not on file  Food Insecurity:   . Worried About Charity fundraiser in the Last Year: Not on file  . Ran Out of Food in the Last  Year: Not on file  Transportation Needs:   . Lack of Transportation (Medical): Not on file  . Lack of Transportation (Non-Medical): Not on file  Physical Activity:   . Days of Exercise per Week: Not on file  . Minutes of Exercise per Session: Not on file  Stress:   . Feeling of Stress : Not on file  Social Connections:   . Frequency of Communication with Friends and Family: Not on file  . Frequency of Social Gatherings with Friends and Family: Not on file  . Attends Religious Services: Not on file  . Active Member of Clubs or Organizations: Not on file  . Attends Archivist Meetings: Not on file  . Marital Status: Not on file  Intimate  Partner Violence:   . Fear of Current or Ex-Partner: Not on file  . Emotionally Abused: Not on file  . Physically Abused: Not on file  . Sexually Abused: Not on file    No family history on file. Allergies  Allergen Reactions  . Clindamycin Hives  . Doxycycline Rash  . Penicillins Rash  . Sulfamethoxazole Rash    ? Current Outpatient Medications  Medication Sig Dispense Refill  . acetaminophen (TYLENOL) 500 MG tablet Take 1,000 mg by mouth 2 (two) times daily.    . baclofen (LIORESAL) 10 MG tablet Take 10 mg by mouth daily as needed for muscle spasms.     . bicalutamide (CASODEX) 50 MG tablet Take 50 mg by mouth daily.    Marland Kitchen gabapentin (NEURONTIN) 800 MG tablet Take 1 tablet (800 mg total) by mouth 3 (three) times daily. 90 tablet 0  . losartan (COZAAR) 100 MG tablet Take 100 mg by mouth daily.    . methenamine (HIPREX) 1 g tablet Take 1 g by mouth 3 (three) times daily.     . mirabegron ER (MYRBETRIQ) 50 MG TB24 tablet Take 50 mg by mouth daily.     Marland Kitchen oxybutynin (DITROPAN-XL) 10 MG 24 hr tablet Take 10 mg by mouth every morning.     Marland Kitchen PAIN MANAGEMENT INTRATHECAL, IT, PUMP 1 each by Intrathecal route continuous. Intrathecal (IT) medication:  Baclofen, Dilaudid    . polyethylene glycol powder (GLYCOLAX/MIRALAX) powder Take 17 g by mouth daily.     Marland Kitchen Propylene Glycol (SYSTANE BALANCE) 0.6 % SOLN Place 1 drop into both eyes daily as needed (dry eyes).     No current facility-administered medications for this visit.     Abtx:  Anti-infectives (From admission, onward)   None      REVIEW OF SYSTEMS:  Const: negative fever, negative chills, some  weight loss Eyes: negative diplopia or visual changes, negative eye pain ENT: negative coryza, negative sore throat Resp: negative cough, hemoptysis, dyspnea Cards: negative for chest pain, palpitations, lower extremity edema GU: straight cath multiple times for neurogenic bladder GI: Negative for abdominal pain, diarrhea, bleeding,  constipation Skin: negative for rash and pruritus Heme: negative for easy bruising and gum/nose bleeding MS: leg weakness Neurolo paraperesis/wheel chair bound Psych: negative for feelings of anxiety, depression  Endocrine: negative for thyroid, diabetes Allergy/Immunology- as above ?  Objective:  VITALS:  BP (!) 165/91   Pulse (!) 57   Resp 16   Ht 6\' 1"  (1.854 m)   Wt 190 lb (86.2 kg)   SpO2 98%   BMI 25.07 kg/m  PHYSICAL EXAM:  General: Alert, cooperative, no distress, appears stated age.  Lungs: Clear to auscultation bilaterally. No Wheezing or Rhonchi. No rales. Heart: Regular  rate and rhythm, no murmur, rub or gallop. Abdomen: Soft, non-tender,not distended. Bowel sounds normal. No masses Extremities: atraumatic, no cyanosis. No edema. No clubbing Skin: No rashes or lesions. Or bruising Lymph: Cervical, supraclavicular normal. Neurologic: paraperesis   2020       Pertinent Labs Lab Results CBC    Component Value Date/Time   WBC 6.2 10/27/2019 1200   RBC 4.61 10/27/2019 1200   HGB 13.4 10/27/2019 1200   HGB 16.3 09/27/2013 2113   HCT 40.1 10/27/2019 1200   HCT 48.0 09/27/2013 2113   PLT 197 10/27/2019 1200   PLT 182 09/27/2013 2113   MCV 87.0 10/27/2019 1200   MCV 87 09/27/2013 2113   MCH 29.1 10/27/2019 1200   MCHC 33.4 10/27/2019 1200   RDW 11.9 10/27/2019 1200   RDW 13.5 09/27/2013 2113   LYMPHSABS 1.5 10/27/2019 1200   LYMPHSABS 1.7 09/27/2013 2113   MONOABS 0.4 10/27/2019 1200   MONOABS 1.4 (H) 09/27/2013 2113   EOSABS 0.1 10/27/2019 1200   EOSABS 0.2 09/27/2013 2113   BASOSABS 0.0 10/27/2019 1200   BASOSABS 0.0 09/27/2013 2113    CMP Latest Ref Rng & Units 10/27/2019 07/20/2018 07/19/2018  Glucose 70 - 99 mg/dL 97 97 -  BUN 8 - 23 mg/dL 16 22 -  Creatinine 0.61 - 1.24 mg/dL 0.56(L) 0.67 0.60(L)  Sodium 135 - 145 mmol/L 139 139 -  Potassium 3.5 - 5.1 mmol/L 4.3 4.1 -  Chloride 98 - 111 mmol/L 103 102 -  CO2 22 - 32 mmol/L 29 27 -    Calcium 8.9 - 10.3 mg/dL 9.4 9.1 -  Total Protein 6.5 - 8.1 g/dL 7.4 - -  Total Bilirubin 0.3 - 1.2 mg/dL 0.5 - -  Alkaline Phos 38 - 126 U/L 71 - -  AST 15 - 41 U/L 12(L) - -  ALT 0 - 44 U/L 10 - -    ? Impression/Recommendation ? 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 Previously had MRSA in the bone culture in Dec 2019 and received 8 weeks of IV followed by 2 weeks of oral- No antibiotics since March 2020 except for 3 days of PO levaquin on 10/27/19- Culture from 10/31/19 was MRSA . Has been on vanco since 11/10/19 and received 6 weeks- underwent excision aof ulcer and flap at [facility] on 01/03/20 . Bone culture positive for MRSA- took another 6 weeks of iV vanco- Was in [facility] 6/15-7/16 Discharged to rehab on 7/16 and went home on 8/11 The surgical site has healed completely Wound/ulcer  healed completely  Neurogenic bladder- in/out catheter and hippurex  Spinal cord stimulator  Intrathecal baclofen/dilaudid ? ?Discussed mRNA vaccine and gave him information . He will get it soon ___________________________________________________ Discussed with patient,  Will see him PRN  Note:  This document was prepared using Dragon voice recognition software and may include unintentional dictation errors.

## 2020-03-13 NOTE — Patient Instructions (Signed)
You are here for follow up of the decubitus ulcer which has healed completely with plastic surgery and also being treated with vancomycin  For MRSA- as the wound has healed you dont need to see me anymore. I have given you a list of clinics for vaccine

## 2020-04-19 ENCOUNTER — Telehealth: Payer: Self-pay

## 2020-04-27 NOTE — Telephone Encounter (Signed)
Error

## 2020-04-27 NOTE — Telephone Encounter (Signed)
Called to verify patient did not need refill.

## 2020-07-21 DIAGNOSIS — L89309 Pressure ulcer of unspecified buttock, unspecified stage: Secondary | ICD-10-CM

## 2020-07-21 HISTORY — PX: FREE SKIN GRAFT: SHX1680

## 2020-07-21 HISTORY — DX: Pressure ulcer of unspecified buttock, unspecified stage: L89.309

## 2020-09-10 ENCOUNTER — Other Ambulatory Visit: Payer: Self-pay | Admitting: Urology

## 2020-09-10 DIAGNOSIS — R310 Gross hematuria: Secondary | ICD-10-CM

## 2020-09-24 ENCOUNTER — Ambulatory Visit
Admission: RE | Admit: 2020-09-24 | Discharge: 2020-09-24 | Disposition: A | Discharge status: Home / Self Care | Payer: Medicare Other | Source: Ambulatory Visit | Attending: Urology | Admitting: Urology

## 2020-09-24 ENCOUNTER — Other Ambulatory Visit: Payer: Self-pay

## 2020-09-24 DIAGNOSIS — R310 Gross hematuria: Secondary | ICD-10-CM

## 2020-09-24 LAB — POCT I-STAT CREATININE: Creatinine, Ser: 0.7 mg/dL (ref 0.61–1.24)

## 2020-09-24 MED ORDER — IOHEXOL 300 MG/ML  SOLN
125.0000 mL | Freq: Once | INTRAMUSCULAR | Status: AC | PRN
  Administered 2020-09-24: 125 mL via INTRAVENOUS

## 2020-09-27 NOTE — H&P (Signed)
NAME: [name], BERENDT MEDICAL RECORD NO: [id number] ACCOUNT NO: [id number] DATE OF BIRTH: 05-Jun-1951 PHYSICIAN: [name]. Yves Dill, MD  History and Physical   DATE OF ADMISSION: 10/04/2020  Same day surgery 10/04/2020.  CHIEF COMPLAINT:  Gross hematuria due to bladder stones.  HISTORY OF PRESENT ILLNESS:  The patient is a 70 year old white male, paraplegic, with urethral stricture disease, who presented with hematuria.  CT scan was performed indicating the presence of several small bladder stones.  He comes in now for  cystoscopy with litholapaxy using holmium laser.  He also has a history of urethral stricture disease that is managed with sounding monthly.  He may need sounding at this time as well.  PAST MEDICAL HISTORY:  ALLERGIES:  THE PATIENT IS ALLERGIC TO PENICILLIN, DOXYCYCLINE, CLINDAMYCIN, AND SULFA DRUGS.  CURRENT MEDICATIONS:  Include baclofen, methenamine, diclofenac, gabapentin, losartan, bicalutamide, oxybutynin, polyethylene glycol, and Lupron.  PAST SURGICAL HISTORY:    1.  Arthroscopic ACL repair in 1970s. 2.  Back surgery. 3.  Cataract surgery. 4.  Colonoscopy. 5.  Spinal cord stimulator for control of spasms. 6.  Internal urethrotomy for stricture disease in 2013 and 2016. 7.  Cystolitholapaxy for bladder stones in 2018. 8.  Flap reconstruction of sacral decubitus ulcer in 2020.  PAST AND CURRENT MEDICAL CONDITIONS: 1.  Paraplegia. 2.  Kidney stones. 3.  Prostate cancer. 4.  Hypertension. 5.  Hyperlipidemia. 6.  Neurogenic bladder. 7.  Urethral stricture disease.  REVIEW OF SYSTEMS:  The patient denies chest pain, shortness of breath, or stroke.  PHYSICAL EXAMINATION: GENERAL:  Obese white male in no acute distress. HEENT:  Sclerae were clear.  Pupils were equally round, reactive to light and accommodation. NECK:  No palpable cervical adenopathy. PULMONARY:  Lungs clear to auscultation. CARDIOVASCULAR:  Regular rhythm and rate without  murmurs. ABDOMEN:  Soft, nontender. GENITOURINARY:  Uncircumcised.  Testes were smooth and nontender, but atrophic. RECTAL:  Less than 15 gram flat, smooth, nontender prostate. NEUROMUSCULAR:  Consistent with paraplegia.  He was alert and oriented x3.  IMPRESSION:  1.  Gross hematuria due to bladder stones. 2.  Neurogenic bladder. 3.  Paraplegia. 4.  Prostate cancer. 5.  Urethral stricture disease.  PLAN:  Cysto with litholapaxy of bladder stones with holmium laser.   ROH D: 09/26/2020 2:38:55 pm T: 09/26/2020 4:32:00 pm  JOB: [id number]/ [id number]

## 2020-09-28 ENCOUNTER — Other Ambulatory Visit
Admission: RE | Admit: 2020-09-28 | Discharge: 2020-09-28 | Disposition: A | Discharge status: Home / Self Care | Payer: Medicare Other | Source: Ambulatory Visit | Attending: Urology | Admitting: Urology

## 2020-09-28 ENCOUNTER — Other Ambulatory Visit: Payer: Self-pay

## 2020-09-28 HISTORY — DX: Neuromuscular dysfunction of bladder, unspecified: N31.9

## 2020-09-28 HISTORY — DX: Myoneural disorder, unspecified: G70.9

## 2020-09-28 HISTORY — DX: Chronic pain syndrome: G89.4

## 2020-09-28 NOTE — Patient Instructions (Addendum)
INSTRUCTIONS FOR SURGERY     Your surgery is scheduled for:   Thursday, MARCH 17TH     To find out your arrival time for the day of surgery,          please call [phone] between 1 pm and 3 pm on : Wednesday, MARCH 16TH     When you arrive for surgery, report to the [facility].      ONCE FINISHED THERE, GO TO THE SECOND FLOOR AND SIGN IN AT SURGERY DESK.          REMEMBER: Instructions that are not followed completely may result in serious medical risk,  up to and including death, or upon the discretion of your surgeon and anesthesiologist,            your surgery may need to be rescheduled.  __X__ 1. Do not eat food after midnight the night before your procedure.                    No gum, candy, lozenger, tic tacs, tums or hard candies.                  ABSOLUTELY NOTHING SOLID IN YOUR MOUTH AFTER MIDNIGHT                    You may drink unlimited clear liquids up to 2 hours before you are scheduled to arrive for surgery.                   Do not drink anything within those 2 hours unless you need to take medicine, then take the                   smallest amount you need.  Clear liquids include:  water, apple juice without pulp,                   any flavor Gatorade, Black coffee, black tea.  Sugar may be added but no dairy/ honey /lemon.                        Broth and jello is not considered a clear liquid.  __x__  2. On the morning of surgery, please brush your teeth with toothpaste and water. You may rinse with                  mouthwash if you wish but DO NOT SWALLOW TOOTHPASTE OR MOUTHWASH  __X___3. NO alcohol for 24 hours before or after surgery.  __x___ 4.  Do NOT smoke or use e-cigarettes for 24 HOURS PRIOR TO SURGERY.                      DO NOT Use any chewable tobacco products for at least 6 hours prior to surgery.  __x___ 5. If you start any new medication after this appointment and  prior to surgery, please                   Bring it with you on the day of surgery.  ___x__ 6.  Notify your doctor if there is any change in your medical condition, such as fever, infection, vomitting,                   Diarrhea or any open sores.  __x___ 7.  USE ANTIBACTERIAL SOAP as instructed, the night before surgery and the day of surgery.                   Once you have washed with this soap, do NOT use any of the following: Powders, perfumes                    or lotions. Please do not wear make up, hairpins, clips or nail polish. You MAY wear deodorant.                   Men may shave their face and neck.                     DO NOT wear ANY jewelry on the day of surgery. If there are rings that are too tight to                    remove easily, please address this prior to the surgery day. Piercings need to be removed.                                                                     NO METAL ON YOUR BODY.                    Do NOT bring any valuables.  If you came to Pre-Admit testing then you will not need license,                     insurance card or credit card.  If you will be staying overnight, please either leave your things in                     the car or have your family be responsible for these items.                     [facility] IS NOT RESPONSIBLE FOR BELONGINGS OR VALUABLES.  ___X__ 8. DO NOT wear contact lenses on surgery day.  You may not have dentures,                     Hearing aides, contacts or glasses in the operating room. These items can be                    Placed in the Recovery Room to receive immediately after surgery.  __x___ 9. IF YOU ARE SCHEDULED TO GO HOME ON THE SAME DAY, YOU MUST                   Have someone to drive you home and to stay with you  for the first 24 hours.                    Have an arrangement prior to arriving on surgery day.  ___x__ 10. Take the following medications on  the morning of surgery with a sip of water:                               1. GABAPENTIN                     2. OXYBUTYNIN                     3. MYRBETRIQ                     4. EYE DROPS, if needed                     5.                     6.  _____ 11.  Follow any instructions provided to you by your surgeon.                        Such as enema, clear liquid bowel prep  __X__  12. STOP ALL ASPIRIN PRODUCTS ONE WEEK BEFORE SURGERY.                       THIS INCLUDES BC POWDERS / GOODIES POWDER  __x___ 13. STOP Anti-inflammatories as of ONE WEEK PRIOR TO SURGERY.                      This includes IBUPROFEN / MOTRIN / ADVIL / ALEVE/ NAPROXYN                        STOP VOLTAREN NOW                    YOU MAY TAKE TYLENOL ANY TIME PRIOR TO SURGERY.  ___X___17.  Continue to take the following medications but do not take on the morning of surgery:                         LOSARTAN // HIPREX // CASODEX // MIRALAX  ______18. If staying overnight, please have appropriate shoes to wear to be able to walk around the unit.                   Wear clean and comfortable clothing to the hospital.  [place] TO USE AFTER SURGERY. BRING THE DEVICE FOR YOUR STIMULATOR. BRING CELL PHONE NUMBER FOR YOUR WIFE.

## 2020-10-02 ENCOUNTER — Other Ambulatory Visit
Admission: RE | Admit: 2020-10-02 | Discharge: 2020-10-02 | Disposition: A | Discharge status: Home / Self Care | Payer: Medicare Other | Source: Ambulatory Visit | Attending: Urology | Admitting: Urology

## 2020-10-02 ENCOUNTER — Other Ambulatory Visit: Payer: Self-pay

## 2020-10-02 ENCOUNTER — Other Ambulatory Visit: Payer: Self-pay | Admitting: Adult Health Nurse Practitioner

## 2020-10-02 DIAGNOSIS — Z9689 Presence of other specified functional implants: Secondary | ICD-10-CM

## 2020-10-02 DIAGNOSIS — Z01812 Encounter for preprocedural laboratory examination: Secondary | ICD-10-CM | POA: Diagnosis present

## 2020-10-02 DIAGNOSIS — Z20822 Contact with and (suspected) exposure to covid-19: Secondary | ICD-10-CM | POA: Diagnosis not present

## 2020-10-02 LAB — BASIC METABOLIC PANEL
Anion gap: 6 (ref 5–15)
BUN: 20 mg/dL (ref 8–23)
CO2: 28 mmol/L (ref 22–32)
Calcium: 9.2 mg/dL (ref 8.9–10.3)
Chloride: 105 mmol/L (ref 98–111)
Creatinine, Ser: 0.65 mg/dL (ref 0.61–1.24)
GFR, Estimated: >60 mL/min (ref >60)
Glucose, Bld: 93 mg/dL (ref 70–99)
Potassium: 3.7 mmol/L (ref 3.5–5.1)
Sodium: 139 mmol/L (ref 135–145)

## 2020-10-02 LAB — CBC
HCT: 39.6 % (ref 39.0–52.0)
Hemoglobin: 13.3 g/dL (ref 13.0–17.0)
MCH: 29.3 pg (ref 26.0–34.0)
MCHC: 33.6 g/dL (ref 30.0–36.0)
MCV: 87.2 fL (ref 80.0–100.0)
Platelets: 181 10*3/uL (ref 150–400)
RBC: 4.54 MIL/uL (ref 4.22–5.81)
RDW: 12 % (ref 11.5–15.5)
WBC: 4.2 10*3/uL (ref 4.0–10.5)
nRBC: 0 % (ref 0.0–0.2)

## 2020-10-02 LAB — SARS CORONAVIRUS 2 (TAT 6-24 HRS): SARS Coronavirus 2: NEGATIVE

## 2020-10-03 MED ORDER — GENTAMICIN IN SALINE 1.6-0.9 MG/ML-% IV SOLN
80.0000 mg | Freq: Once | INTRAVENOUS | Status: AC
  Administered 2020-10-04: 80 mg via INTRAVENOUS
  Filled 2020-10-03: qty 50

## 2020-10-03 MED ORDER — GENTAMICIN SULFATE 40 MG/ML IJ SOLN
80.0000 mg | Freq: Once | INTRAVENOUS | Status: DC
  Filled 2020-10-03: qty 2

## 2020-10-03 MED ORDER — CHLORHEXIDINE GLUCONATE 0.12 % MT SOLN
15.0000 mL | Freq: Once | OROMUCOSAL | Status: AC
  Administered 2020-10-04: 15 mL via OROMUCOSAL

## 2020-10-03 MED ORDER — FAMOTIDINE 20 MG PO TABS: 20.0000 mg | ORAL_TABLET | Freq: Once | ORAL | Status: DC

## 2020-10-03 MED ORDER — LACTATED RINGERS IV SOLN: INTRAVENOUS | Status: DC

## 2020-10-03 MED ORDER — ORAL CARE MOUTH RINSE: 15.0000 mL | Freq: Once | OROMUCOSAL | Status: AC

## 2020-10-04 ENCOUNTER — Encounter: Payer: Self-pay | Admitting: Urology

## 2020-10-04 ENCOUNTER — Other Ambulatory Visit: Payer: Self-pay

## 2020-10-04 ENCOUNTER — Ambulatory Visit: Payer: Medicare Other

## 2020-10-04 ENCOUNTER — Ambulatory Visit: Payer: Medicare Other | Admitting: Anesthesiology

## 2020-10-04 ENCOUNTER — Ambulatory Visit
Admission: RE | Admit: 2020-10-04 | Discharge: 2020-10-04 | Disposition: A | Discharge status: Home / Self Care | Payer: Medicare Other | Attending: Urology | Admitting: Urology

## 2020-10-04 ENCOUNTER — Encounter
Admission: RE | Disposition: A | Discharge status: Home / Self Care | Payer: Self-pay | Source: Home / Self Care | Attending: Urology

## 2020-10-04 DIAGNOSIS — N210 Calculus in bladder: Secondary | ICD-10-CM | POA: Diagnosis present

## 2020-10-04 DIAGNOSIS — Z881 Allergy status to other antibiotic agents status: Secondary | ICD-10-CM | POA: Insufficient documentation

## 2020-10-04 DIAGNOSIS — Z882 Allergy status to sulfonamides status: Secondary | ICD-10-CM | POA: Insufficient documentation

## 2020-10-04 DIAGNOSIS — N319 Neuromuscular dysfunction of bladder, unspecified: Secondary | ICD-10-CM | POA: Diagnosis not present

## 2020-10-04 DIAGNOSIS — Z79899 Other long term (current) drug therapy: Secondary | ICD-10-CM | POA: Diagnosis not present

## 2020-10-04 DIAGNOSIS — N312 Flaccid neuropathic bladder, not elsewhere classified: Secondary | ICD-10-CM

## 2020-10-04 DIAGNOSIS — Z880 Allergy status to penicillin: Secondary | ICD-10-CM | POA: Diagnosis not present

## 2020-10-04 DIAGNOSIS — C61 Malignant neoplasm of prostate: Secondary | ICD-10-CM | POA: Diagnosis not present

## 2020-10-04 DIAGNOSIS — G8220 Paraplegia, unspecified: Secondary | ICD-10-CM | POA: Diagnosis not present

## 2020-10-04 DIAGNOSIS — N35919 Unspecified urethral stricture, male, unspecified site: Secondary | ICD-10-CM | POA: Insufficient documentation

## 2020-10-04 HISTORY — PX: CYSTOSCOPY WITH LITHOLAPAXY: SHX1425

## 2020-10-04 SURGERY — CYSTOSCOPY, WITH BLADDER CALCULUS LITHOLAPAXY
Anesthesia: General

## 2020-10-04 MED ORDER — PHENYLEPHRINE HCL (PRESSORS) 10 MG/ML IV SOLN
INTRAVENOUS | Status: DC | PRN
  Administered 2020-10-04 (×3): 100 ug via INTRAVENOUS

## 2020-10-04 MED ORDER — EPHEDRINE 5 MG/ML INJ
INTRAVENOUS | Status: AC
  Filled 2020-10-04: qty 10

## 2020-10-04 MED ORDER — MIDAZOLAM HCL 2 MG/2ML IJ SOLN
INTRAMUSCULAR | Status: AC
  Filled 2020-10-04: qty 2

## 2020-10-04 MED ORDER — ACETAMINOPHEN 10 MG/ML IV SOLN
INTRAVENOUS | Status: DC | PRN
  Administered 2020-10-04: 1000 mg via INTRAVENOUS

## 2020-10-04 MED ORDER — ACETAMINOPHEN 10 MG/ML IV SOLN
1000.0000 mg | Freq: Once | INTRAVENOUS | Status: DC | PRN
Start: 2020-10-04 — End: 2020-10-04

## 2020-10-04 MED ORDER — ONDANSETRON HCL 4 MG/2ML IJ SOLN: 4.0000 mg | Freq: Once | INTRAMUSCULAR | Status: DC | PRN

## 2020-10-04 MED ORDER — FENTANYL CITRATE (PF) 100 MCG/2ML IJ SOLN
25.0000 ug | INTRAMUSCULAR | Status: DC | PRN
Start: 2020-10-04 — End: 2020-10-04

## 2020-10-04 MED ORDER — CIPROFLOXACIN HCL 500 MG PO TABS: 500.0000 mg | ORAL_TABLET | Freq: Two times a day (BID) | ORAL | 0 refills | Status: DC

## 2020-10-04 MED ORDER — BELLADONNA ALKALOIDS-OPIUM 16.2-60 MG RE SUPP
RECTAL | Status: DC | PRN
  Administered 2020-10-04: 1 via RECTAL

## 2020-10-04 MED ORDER — LIDOCAINE HCL URETHRAL/MUCOSAL 2 % EX GEL
CUTANEOUS | Status: AC
  Filled 2020-10-04: qty 10

## 2020-10-04 MED ORDER — ONDANSETRON HCL 4 MG/2ML IJ SOLN
INTRAMUSCULAR | Status: DC | PRN
  Administered 2020-10-04: 4 mg via INTRAVENOUS

## 2020-10-04 MED ORDER — LIDOCAINE HCL URETHRAL/MUCOSAL 2 % EX GEL
CUTANEOUS | Status: DC | PRN
  Administered 2020-10-04: 1 via TOPICAL

## 2020-10-04 MED ORDER — PROPOFOL 500 MG/50ML IV EMUL
INTRAVENOUS | Status: AC
  Filled 2020-10-04: qty 50

## 2020-10-04 MED ORDER — LIDOCAINE HCL (CARDIAC) PF 100 MG/5ML IV SOSY
PREFILLED_SYRINGE | INTRAVENOUS | Status: DC | PRN
  Administered 2020-10-04: 100 mg via INTRAVENOUS

## 2020-10-04 MED ORDER — PROPOFOL 10 MG/ML IV BOLUS
INTRAVENOUS | Status: DC | PRN
  Administered 2020-10-04: 150 mg via INTRAVENOUS

## 2020-10-04 MED ORDER — EPHEDRINE SULFATE 50 MG/ML IJ SOLN
INTRAMUSCULAR | Status: DC | PRN
  Administered 2020-10-04 (×2): 10 mg via INTRAVENOUS

## 2020-10-04 MED ORDER — FENTANYL CITRATE (PF) 100 MCG/2ML IJ SOLN
INTRAMUSCULAR | Status: DC | PRN
  Administered 2020-10-04: 25 ug via INTRAVENOUS

## 2020-10-04 MED ORDER — ACETAMINOPHEN 10 MG/ML IV SOLN
INTRAVENOUS | Status: AC
  Filled 2020-10-04: qty 100

## 2020-10-04 MED ORDER — OXYCODONE HCL 5 MG/5ML PO SOLN: 5.0000 mg | Freq: Once | ORAL | Status: DC | PRN

## 2020-10-04 MED ORDER — CHLORHEXIDINE GLUCONATE 0.12 % MT SOLN
OROMUCOSAL | Status: AC
  Filled 2020-10-04: qty 15

## 2020-10-04 MED ORDER — SODIUM CHLORIDE 0.9 % IV SOLN
INTRAVENOUS | Status: DC | PRN
  Administered 2020-10-04: 10 ug/min via INTRAVENOUS

## 2020-10-04 MED ORDER — MIDAZOLAM HCL 2 MG/2ML IJ SOLN
INTRAMUSCULAR | Status: DC | PRN
  Administered 2020-10-04: 2 mg via INTRAVENOUS

## 2020-10-04 MED ORDER — FENTANYL CITRATE (PF) 100 MCG/2ML IJ SOLN
INTRAMUSCULAR | Status: AC
  Filled 2020-10-04: qty 2

## 2020-10-04 MED ORDER — OXYCODONE HCL 5 MG PO TABS: 5.0000 mg | ORAL_TABLET | Freq: Once | ORAL | Status: DC | PRN

## 2020-10-04 MED ORDER — BELLADONNA ALKALOIDS-OPIUM 16.2-60 MG RE SUPP
RECTAL | Status: AC
  Filled 2020-10-04: qty 1

## 2020-10-04 SURGICAL SUPPLY — 28 items
BAG DRAIN CYSTO-URO LG1000N (MISCELLANEOUS) ×2 IMPLANT
BAG DRN RND TRDRP ANRFLXCHMBR (UROLOGICAL SUPPLIES) ×1
BAG URINE DRAIN 2000ML AR STRL (UROLOGICAL SUPPLIES) ×1 IMPLANT
CATH FOLEY 2WAY SIL 20X30 (CATHETERS) ×1 IMPLANT
CNTNR SPEC 2.5X3XGRAD LEK (MISCELLANEOUS)
CONT SPEC 4OZ STER OR WHT (MISCELLANEOUS)
CONT SPEC 4OZ STRL OR WHT (MISCELLANEOUS)
CONTAINER SPEC 2.5X3XGRAD LEK (MISCELLANEOUS) IMPLANT
FIBER LASER FLEXIVA 1000 (UROLOGICAL SUPPLIES) IMPLANT
FIBER LASER FLEXIVA 550 (UROLOGICAL SUPPLIES) IMPLANT
FIBER LASER FLEXIVA PULSE 550 (Laser) ×1 IMPLANT
GLOVE SURG ENC MOIS LTX SZ7.5 (GLOVE) ×2 IMPLANT
GOWN STRL REUS W/ TWL LRG LVL4 (GOWN DISPOSABLE) ×1 IMPLANT
GOWN STRL REUS W/ TWL XL LVL3 (GOWN DISPOSABLE) ×1 IMPLANT
GOWN STRL REUS W/TWL LRG LVL4 (GOWN DISPOSABLE) ×2
GOWN STRL REUS W/TWL XL LVL3 (GOWN DISPOSABLE) ×2
KIT TURNOVER CYSTO (KITS) ×2 IMPLANT
MANIFOLD NEPTUNE II (INSTRUMENTS) ×2 IMPLANT
PACK CYSTO AR (MISCELLANEOUS) ×2 IMPLANT
SET IRRIG Y TYPE TUR BLADDER L (SET/KITS/TRAYS/PACK) ×2 IMPLANT
SOL .9 NS 3000ML IRR  AL (IV SOLUTION) ×2
SOL .9 NS 3000ML IRR AL (IV SOLUTION) ×1
SOL .9 NS 3000ML IRR UROMATIC (IV SOLUTION) ×1 IMPLANT
SYR TOOMEY IRRIG 70ML (MISCELLANEOUS) ×2
SYRINGE TOOMEY IRRIG 70ML (MISCELLANEOUS) ×1 IMPLANT
WATER STERILE IRR 1000ML POUR (IV SOLUTION) ×2 IMPLANT
WATER STERILE IRR 3000ML UROMA (IV SOLUTION) ×2 IMPLANT
WIRE G 035X145 STIFF STR (WIRE) ×1 IMPLANT

## 2020-10-04 NOTE — H&P (Signed)
Date of Initial H&P: 09/26/20  History reviewed, patient examined, no change in status, stable for surgery. 

## 2020-10-04 NOTE — Transfer of Care (Signed)
Immediate Anesthesia Transfer of Care Note  Patient: [name]  Procedure(s) Performed: CYSTOSCOPY WITH LITHOPAXY WITH HOLMIUM LASER (N/A )  Patient Location: PACU  Anesthesia Type:General  Level of Consciousness: sedated  Airway & Oxygen Therapy: Patient connected to face mask oxygen  Post-op Assessment: Post -op Vital signs reviewed and stable  Post vital signs: stable  Last Vitals:  Vitals Value Taken Time  BP 107/63 10/04/20 0833  Temp    Pulse 74 10/04/20 0835  Resp 9 10/04/20 0835  SpO2 100 % 10/04/20 0835  Vitals shown include unvalidated device data.  Last Pain:  Vitals:   10/04/20 0619  PainSc: 5          Complications: No complications documented.

## 2020-10-04 NOTE — Op Note (Signed)
Preoperative diagnosis: 1.  Bladder calculi (N21.0)                                             2.  Urethral stricture disease (N35.01)                                             3.  Neurogenic bladder (N31.9)  Postoperative diagnosis: Same  Procedure: 1.  Laser lithotripsy of bladder calculi with holmium laser (CPT 52318)                      2.  Visual internal urethrotomy (CPT 7823142065)  Surgeon: [name]. Yves Dill MD  Anesthesia: General  Indications:See the history and physical. After informed consent the above procedure(s) were requested     Technique and findings: After adequate general anesthesia been obtained the patient was placed into dorsal lithotomy position and the perineum was prepped and draped in the usual fashion.  The 21 French cystoscope was coupled with a camera and then visually advanced into the urethra.  Stricture was encountered in the bulbar urethra extending to the mid urethra.  The scope was passed beyond the stricture into the bladder.  Both ureteral orifices were identified and had clear efflux.  No bladder tumors were identified.  2 bladder stones measuring 7 mm in size were identified.  The 550 m holmium laser fiber was introduced through the scope and set at a frequency of 10 and a power level of 0.5 J.  The stones were then fully fragmented.  The fragments were evacuated out of the bladder with a Toomey syringe.  8.035 guidewire was then passed through the scope and curled into the bladder.  The cystoscope was removed and the visual urethrotome passed into the urethra.  The stricture was incised at the 12 o'clock position.  At this point the cystoscope was removed and the urethra caliber was measured with male dilators and maximum diameter was 26 [place].  10 cc of viscous Xylocaine was instilled within the urethra and the bladder.  A 20 French silicone Foley catheter was placed and irrigated until clear.  A B&O suppository was placed.  Blood loss was minimal.  The  procedure was then terminated and patient transferred to the recovery room in stable condition.

## 2020-10-04 NOTE — Anesthesia Procedure Notes (Signed)
Procedure Name: LMA Insertion Date/Time: 10/04/2020 7:36 AM Performed by: Aline Brochure, CRNA Pre-anesthesia Checklist: Patient identified, Emergency Drugs available, Suction available and Patient being monitored Patient Re-evaluated:Patient Re-evaluated prior to induction Oxygen Delivery Method: Circle system utilized Preoxygenation: Pre-oxygenation with 100% oxygen Induction Type: IV induction Ventilation: Mask ventilation without difficulty LMA: LMA inserted LMA Size: 4.5 Number of attempts: 1 Placement Confirmation: ETT inserted through vocal cords under direct vision,  positive ETCO2 and breath sounds checked- equal and bilateral Tube secured with: Tape Dental Injury: Teeth and Oropharynx as per pre-operative assessment

## 2020-10-04 NOTE — Anesthesia Postprocedure Evaluation (Signed)
Anesthesia Post Note  Patient: [name]  Procedure(s) Performed: CYSTOSCOPY WITH LITHOPAXY WITH HOLMIUM LASER (N/A )  Patient location during evaluation: PACU Anesthesia Type: General Level of consciousness: awake and alert Pain management: pain level controlled Vital Signs Assessment: post-procedure vital signs reviewed and stable Respiratory status: spontaneous breathing, nonlabored ventilation, respiratory function stable and patient connected to nasal cannula oxygen Cardiovascular status: blood pressure returned to baseline and stable Postop Assessment: no apparent nausea or vomiting Anesthetic complications: no   No complications documented.   Last Vitals:  Vitals:   10/04/20 0915 10/04/20 0924  BP:  134/73  Pulse:  66  Resp: 14 16  Temp: (!) 36.3 C (!) 36.2 C  SpO2:  100%    Last Pain:  Vitals:   10/04/20 0924  TempSrc: Temporal  PainSc: 0-No pain                 Arita Miss

## 2020-10-04 NOTE — Anesthesia Preprocedure Evaluation (Signed)
Anesthesia Evaluation  Patient identified by MRN, date of birth, ID band Patient awake    Reviewed: Allergy & Precautions, NPO status , Patient's Chart, lab work & pertinent test results  History of Anesthesia Complications Negative for: history of anesthetic complications  Airway Mallampati: III  TM Distance: >3 FB Neck ROM: Full    Dental  (+) Partial Upper, Partial Lower   Pulmonary neg pulmonary ROS, neg sleep apnea, neg COPD, Patient abstained from smoking.Not current smoker,    Pulmonary exam normal breath sounds clear to auscultation       Cardiovascular Exercise Tolerance: Poor METShypertension, Pt. on medications (-) CAD, (-) Past MI and (-) CHF (-) dysrhythmias (-) Valvular Problems/Murmurs Rhythm:Regular Rate:Normal - Systolic murmurs    Neuro/Psych neg Seizures L2 parapelegic  Neuromuscular disease negative psych ROS   GI/Hepatic Neg liver ROS, neg GERD  ,  Endo/Other  neg diabetes  Renal/GU negative Renal ROS     Musculoskeletal   Abdominal   Peds  Hematology   Anesthesia Other Findings Past Medical History: No date: AVM (arteriovenous malformation) spine No date: Chronic pain disorder     Comment:  works with the [facility] for intrathecal              pump meds and stimulator 2022: Decubitus ulcer of buttock     Comment:  recently had skin flap surgery to use his own skin to               cover pressure sore.  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 No date: Prostate cancer ([place])     Comment:   taking Casodex (hormone chemotherapy) No date: Status post insertion of intrathecal  baclofen pump No date: Status post insertion of spinal cord stimulator  Reproductive/Obstetrics                             Anesthesia Physical  Anesthesia Plan  ASA: III  Anesthesia Plan: General   Post-op Pain Management:    Induction: Intravenous  PONV Risk Score and Plan: 2 and Dexamethasone and Ondansetron  Airway Management Planned: LMA  Additional Equipment: None  Intra-op Plan:   Post-operative Plan: Extubation in OR  Informed Consent: I have reviewed the patients History and Physical, chart, labs and discussed the procedure including the risks, benefits and alternatives for the proposed anesthesia with the patient or authorized representative who has indicated his/her understanding and acceptance.     Dental advisory given  Plan Discussed with: CRNA and Surgeon  Anesthesia Plan Comments: (Discussed risks of anesthesia with patient, including PONV, sore throat, lip/dental damage. Rare risks discussed as well, such as cardiorespiratory and neurological sequelae. Patient understands. Will turn spinal cord stimulator off, and resume it after surgery)        Anesthesia Quick Evaluation

## 2020-10-04 NOTE — Discharge Instructions (Addendum)
Neurogenic Bladder  Neurogenic bladder is a bladder control disorder. It is usually caused by problems with the nerves that control the bladder. The brain sends signals through the spinal cord to the muscles in the bladder that start and stop urine flow. With neurogenic bladder, the nerves and muscles do not work together the way they should. This condition may make the bladder overactive, meaning you have trouble holding urine. In other cases, it may make the bladder underactive. This means that you have trouble passing urine. What are the causes? This condition may be caused by nerve damage or a condition that disrupts the signals from your brain to your bladder. Many things can cause these nerve problems, including:  A disease that affects the nervous system, such as: ? Alzheimer's disease. ? Cerebral palsy. ? Multiple sclerosis. ? Diabetes. ? Parkinson's disease.  Damage to your brain or spinal cord. This can come from: ? Trauma. ? Tumors. ? Infection. ? Surgery. ? Alcohol abuse. ? Stroke. ? A congenital disability that affects the spinal cord. What increases the risk? You are more likely to develop this condition if you have nerve damage or a nerve disorder. What are the signs or symptoms? Signs and symptoms of this condition include:  Leaking or gushing urine (incontinence).  A sudden, strong urge to pass urine (urgency).  Frequent urination during the day and night.  Being unable to empty your bladder completely (urinary retention).  Frequent urinary tract infections. How is this diagnosed? This condition may be diagnosed based on:  Your symptoms and medical history.  A physical exam.  Records from a bladder diary. You may be asked to keep a record or log of your bladder symptoms and the times that you urinate. You may also have tests, such as:  A urine test to check for infection.  A bladder scan after you urinate to see how much urine is left in your  bladder.  Tests to measure your urine flow and see how well the flow is controlled (urodynamic tests).  A procedure that uses a small device with a camera to look through your urethra into your bladder (cystoscopy). A health care provider who specializes in the urinary tract (urologist) may do this test.  Imaging tests of your brain or spine, such as MRI or CT scan. How is this treated? Treatment for this condition depends on the cause and the symptoms that you have. Work closely with your health care provider to find the treatments that will improve your quality of life. Treatment options include:  Learning ways to control when you urinate, such as: ? Urinating at scheduled times. ? Training yourself to delay urination. ? Exercises to strengthen the muscles that control urine flow (Kegel exercises). ? Avoiding foods or drinks that make your symptoms worse.  Taking medicines to: ? Stimulate an underactive bladder. ? Relax an overactive bladder. ? Treat a urinary tract infection.  Learning how to use a thin tube (catheter) to empty your bladder. A catheter is a hollow tube that you pass through your urethra.  Procedures to stimulate the nerves that control your bladder.  Surgery, if other treatments do not help. Follow these instructions at home: Lifestyle  Keep a bladder diary to find out which foods, liquids, or activities make your symptoms worse.  Use your bladder diary to schedule bathroom trips. If you are away from home, plan to be near a bathroom when your schedule says you will need one.  Limit beverages that stimulate urination.  These include soda, coffee, and tea.  After urinating, wait a few minutes and try again.  Make sure you urinate just before you leave the house and just before you go to bed.   Kegel exercises Do Kegel exercises to strengthen the muscles that control the passing of urine. These muscles are the ones you use to try to hold urine when you need to  urinate. To do Kegel exercises: 1. Squeeze your pelvic floor muscles tight, as if you are trying to stop the flow of urine. You should feel a tight lift in your rectal area. If you are male, you should also feel a tightness in your vaginal area. Keep your stomach, buttocks, and legs relaxed. 2. Hold the muscles tight for 5-10 seconds. 3. Relax your muscles for the same amount of time. 4. Repeat 10 times. Repeat this exercise 3 times a day or as many times as told by your health care provider. General instructions  Take over-the-counter and prescription medicines only as told by your health care provider.  Keep all follow-up visits. This is important. Contact a health care provider if:  You are having a hard time controlling your symptoms.  Your symptoms are getting worse.  You have signs of a urinary tract infection. These may include: ? A burning feeling when you urinate. ? Fever or chills. ? Cloudy or bloody urine. Get help right away if:  You cannot pass urine. Summary  Neurogenic bladder is a bladder control disorder caused by problems with the nerves that control the bladder. This condition may make the bladder overactive or underactive.  This condition may be caused by nerve damage or a condition that disrupts the signals from your brain to your bladder.  Treatment depends on the cause of your neurogenic bladder and the symptoms that you have. Work closely with your health care provider to find the treatments that will improve your quality of life. This information is not intended to replace advice given to you by your health care provider. Make sure you discuss any questions you have with your health care provider. Document Revised: 03/22/2020 Document Reviewed: 03/22/2020 Elsevier Patient Education  2021 [employer] This sheet gives you information about how to care for yourself after your procedure. Your health care provider may also give you  more specific instructions. If you have problems or questions, contact your health care provider. What can I expect after the procedure? After the procedure, it is common to have: Burning pain when urinating. Pain or discomfort in your genital area. A small amount of blood in your urine. Your health care provider will tell you how long you can expect to have blood in your urine. Bloody urine leaking from around your catheter. Follow these instructions at home: Catheter and drainage bag Follow instructions from your health care provider about how to care for your catheter and your drainage bag. Do not take baths, swim, or use a hot tub until your catheter has been removed. You may take showers while your catheter is in place. If you have to insert a catheter on your own (self-catheterization) after your catheter is removed, make sure you understand the procedure completely. Carefully follow instructions from your health care provider.   Medicines Take over-the-counter and prescription medicines only as told by your health care provider. If you were prescribed an antibiotic medicine, take it as told by your health care provider. Do not stop taking the antibiotic even if you start to feel  better. Ask your health care provider if the medicine prescribed to you: Requires you to avoid driving or using heavy machinery. Can cause constipation. You may need to take these actions to prevent or treat constipation: Take over-the-counter or prescription medicines. Eat foods that are high in fiber, such as beans, whole grains, and fresh fruits and vegetables. Limit foods that are high in fat and processed sugars, such as fried or sweet foods. Activity Do not drive for 24 hours if you were given a sedative during your procedure. Take short walks several times a day during your recovery. Do not lift anything that is heavier than 10 lb (4.5 kg), or the limit that you are told, until your health care provider  says that it is safe. Return to your normal activities as told by your health care provider. Ask your health care provider what activities are safe for you. Do not have sex until your health care provider says it is okay.   General instructions Drink enough fluid to keep your urine pale yellow. If a bandage (dressing) was applied over the opening of your urethra, change the dressing as told by your health care provider. Make sure you: Wash your hands with soap and water before and after you change your dressing. If soap and water are not available, use hand sanitizer. Keep your dressing clean and dry. Do not use any products that contain nicotine or tobacco, such as cigarettes, e-cigarettes, and chewing tobacco. These can delay healing after surgery. If you need help quitting, ask your health care provider. Keep all follow-up visits as told by your health care provider. This is important. Contact a health care provider if you: Have a fever or chills. Have pain that gets worse or does not get better with medicine. Have blood in your urine for longer than your health care provider told you to expect. Are a male and have any of these problems: Trouble getting an erection. Pain when you have an erection. Blood in your semen. Have any of these problems after your catheter is removed: Trouble urinating. A slow urine stream. Urinating less than usual. Several streams or "spray" when you urinate. Have pain in your abdomen. Have swelling in your genital area that does not go away. Get help right away if: You have severe pain. A lot of blood is leaking from around your catheter. You have blood clots in your urine. Your catheter stops draining urine. You cannot urinate after your catheter is removed. You have redness, warmth, or pain in your leg. You have chest pain. You have trouble breathing. These symptoms may represent a serious problem that is an emergency. Do not wait to see if the  symptoms will go away. Get medical help right away. Call your local emergency services (911 in the [place] Do not drive yourself to the hospital. Summary After the procedure, it is common to have burning pain when urinating and a small amount of blood in your urine. Follow instructions from your health care provider about how to care for your catheter and your drainage bag. Take short walks several times a day during your recovery. If a bandage (dressing) was applied over the opening of your urethra, change the dressing as told by your health care provider. Contact your health care provider if you have trouble urinating after your catheter is removed. This information is not intended to replace advice given to you by your health care provider. Make sure you discuss any questions you have with your  health care provider. Document Revised: 01/10/2019 Document Reviewed: 01/10/2019 Elsevier Patient Education  2021 [place]   1) The drugs that you were given will stay in your system until tomorrow so for the next 24 hours you should not:  A) Drive an automobile B) Make any legal decisions C) Drink any alcoholic beverage   2) You may resume regular meals tomorrow.  Today it is better to start with liquids and gradually work up to solid foods.  You may eat anything you prefer, but it is better to start with liquids, then soup and crackers, and gradually work up to solid foods.   3) Please notify your doctor immediately if you have any unusual bleeding, trouble breathing, redness and pain at the surgery site, drainage, fever, or pain not relieved by medication.    4) Additional Instructions:  Force non-caffeinated fluids  Please contact your physician with any problems or Same Day Surgery at [phone], Monday through Friday 6 am to 4 pm, or [place] at [facility] number at [phone].

## 2020-10-09 LAB — CALCULI, WITH PHOTOGRAPH (CLINICAL LAB)
Calcium Oxalate Dihydrate: 20 %
Calcium Oxalate Monohydrate: 80 %
Weight Calculi: 56 mg

## 2021-03-21 IMAGING — CT NM BONE 3 PHASE
1 series · 12 of 14 positions shown, 15 images · non-contrast
Comparison: CT pelvis 12/22/2018

CLINICAL DATA: Evaluate for osteomyelitis.

EXAM:
NM BONE SCAN AND SPECT-CT IMAGING
TECHNIQUE: After intravenous injection of radiopharmaceutical, delayed planar
images were obtained in multiple projections. Additionally, delayed
triplanar SPECT-CT images were obtained through the area of
interest.
RADIOPHARMACEUTICALS:  23.13 mCi Nc-99m MDP IV

[Series 4: 3d bone 1.25 b70s · axial · 0.98mm/px · z∈[+1008,+1315]mm · 12 of 519 slices shown, 15 images]
[im 40/519  soft-tissue]
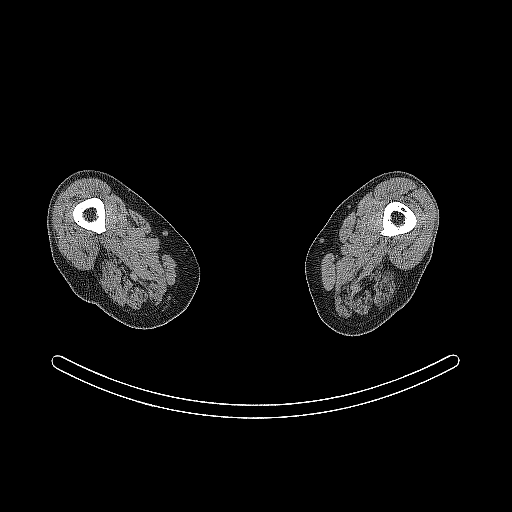
[im 40/519  bone]
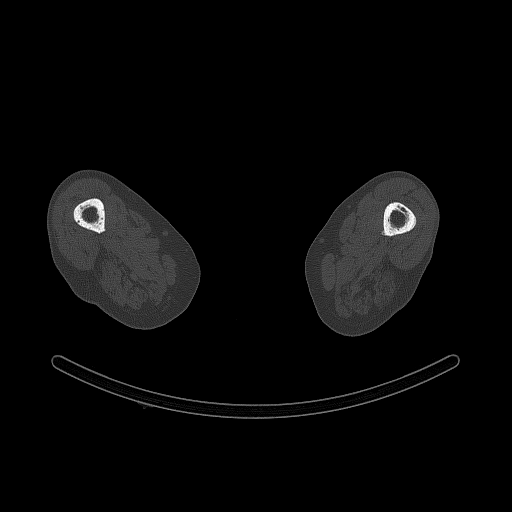
[im 80/519  bone]
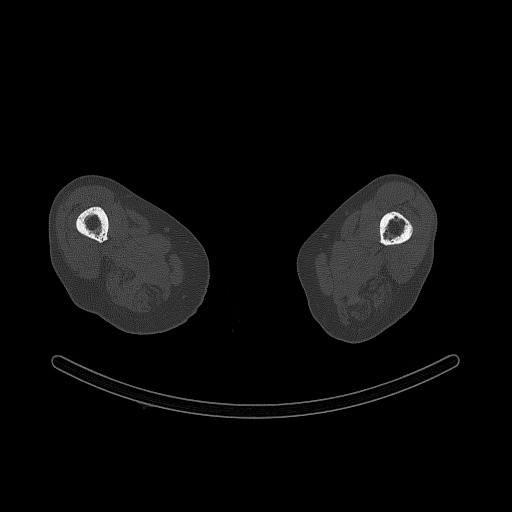
[im 120/519  bone]
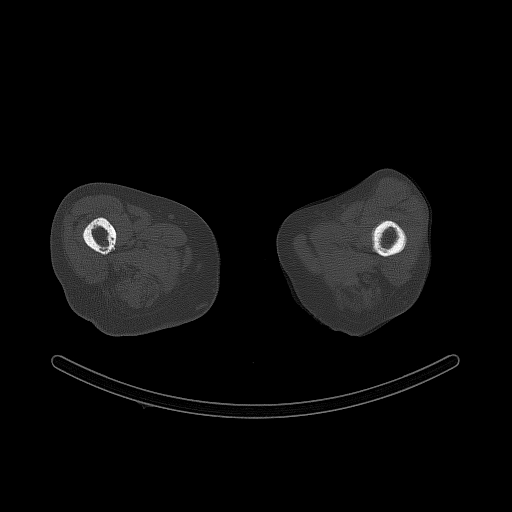
[im 160/519  bone]
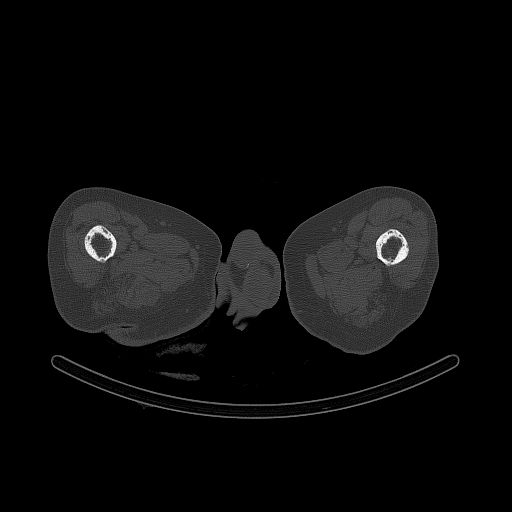
[im 200/519  soft-tissue]
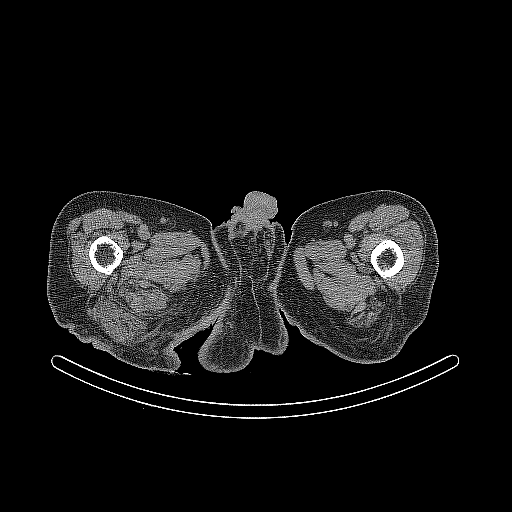
[im 200/519  bone]
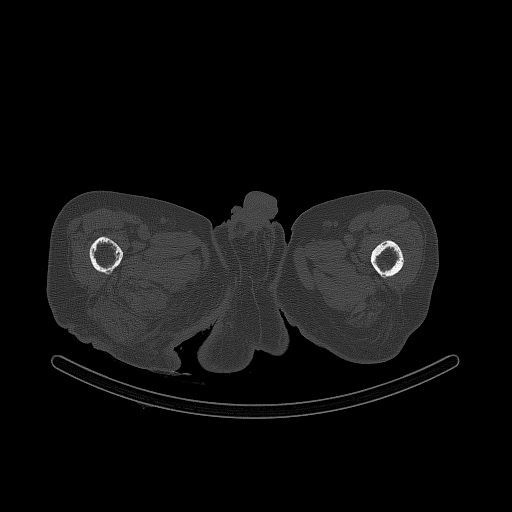
[im 240/519  bone]
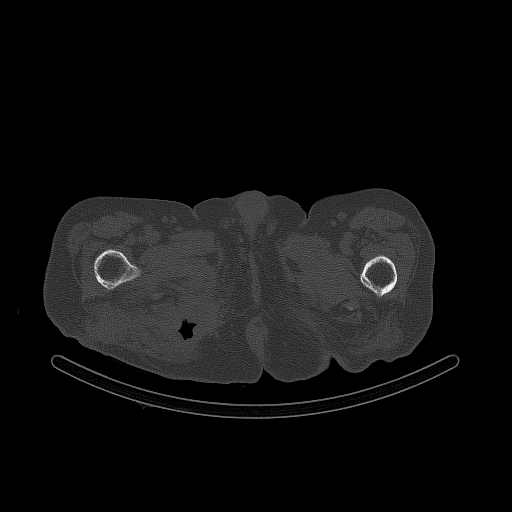
[im 279/519  bone]
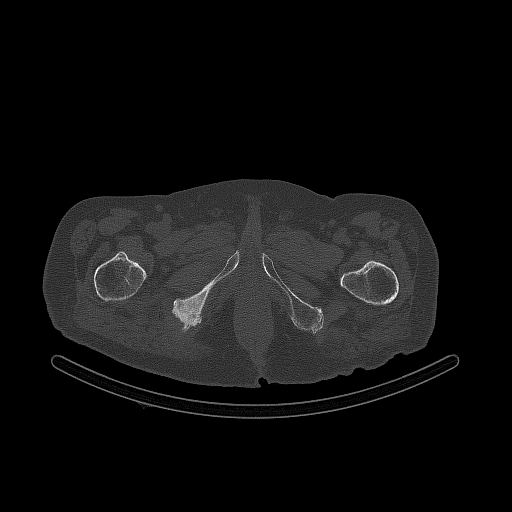
[im 319/519  bone]
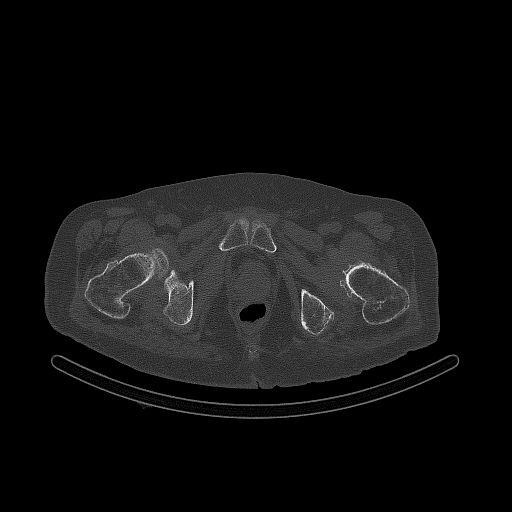
[im 359/519  soft-tissue]
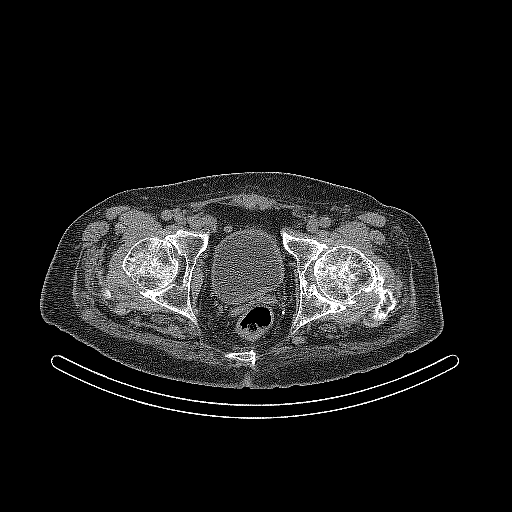
[im 359/519  bone]
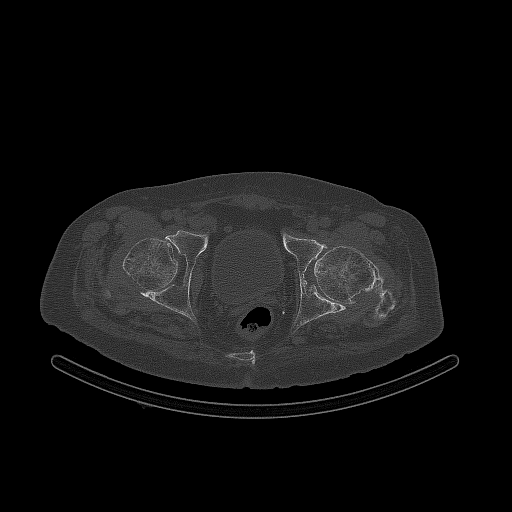
[im 399/519  bone]
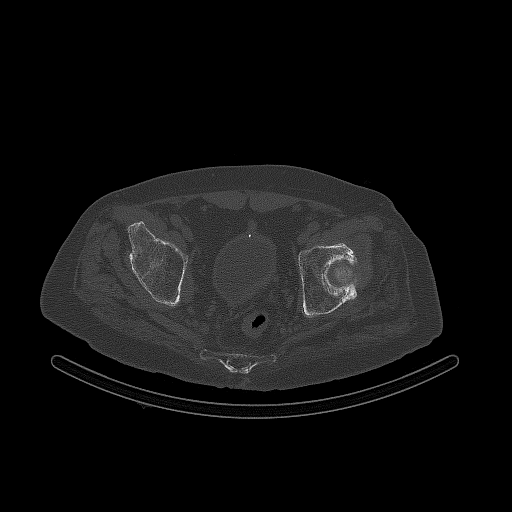
[im 439/519  bone]
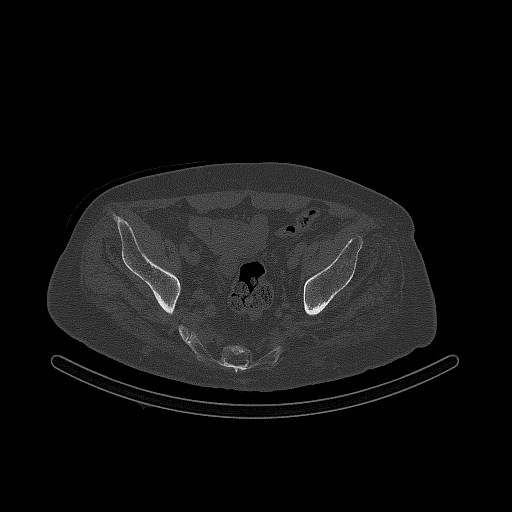
[im 479/519  bone]
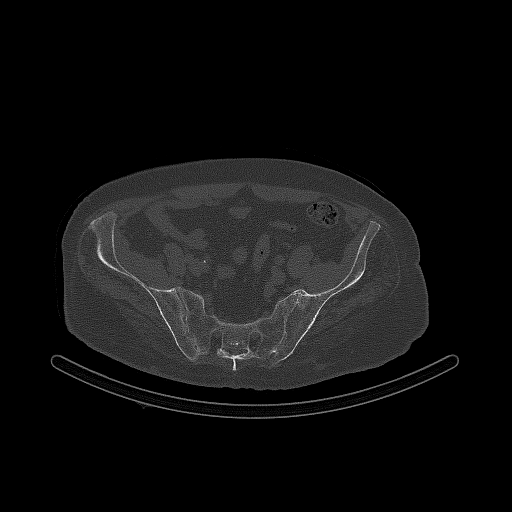

[12 of 14 positions shown; findings below may reference images not displayed]

FINDINGS: On the angiographic phase portion of the exam there is marked
asymmetric increased blood flow to the right pelvis.

On the blood pool phase portion of the exam there is a large focus
of asymmetric increased soft tissue uptake localizing to the right
posterior pelvis.

On the delayed posterior planar images there is asymmetric increased
uptake localizing to the right inferior pubic rami. On the SPECT CT
images there is intense radiotracer uptake localizing to the
posterior aspect of the right ischial tuberosity where there is
asymmetric area of sclerosis and bone fragmentation compatible with
osteomyelitis. On the corresponding CT images there is a large soft
tissue ulcer identified.
IMPRESSION: Exam positive for osteomyelitis involving the right ischial
tuberosity.

## 2021-05-07 ENCOUNTER — Encounter: Payer: Self-pay | Admitting: General Surgery

## 2021-06-19 ENCOUNTER — Other Ambulatory Visit: Payer: Self-pay | Admitting: Urology

## 2021-06-19 DIAGNOSIS — R3129 Other microscopic hematuria: Secondary | ICD-10-CM

## 2021-07-12 ENCOUNTER — Ambulatory Visit
Admission: RE | Admit: 2021-07-12 | Discharge: 2021-07-12 | Disposition: A | Discharge status: Home / Self Care | Payer: Medicare Other | Source: Ambulatory Visit | Attending: Urology | Admitting: Urology

## 2021-07-12 ENCOUNTER — Other Ambulatory Visit: Payer: Self-pay

## 2021-07-12 DIAGNOSIS — R3129 Other microscopic hematuria: Secondary | ICD-10-CM | POA: Insufficient documentation

## 2021-07-12 LAB — POCT I-STAT CREATININE: Creatinine, Ser: 0.7 mg/dL (ref 0.61–1.24)

## 2021-07-12 MED ORDER — IOHEXOL 350 MG/ML SOLN
100.0000 mL | Freq: Once | INTRAVENOUS | Status: AC | PRN
  Administered 2021-07-12: 09:00:00 100 mL via INTRAVENOUS

## 2021-07-26 ENCOUNTER — Other Ambulatory Visit: Payer: Self-pay

## 2021-07-26 ENCOUNTER — Other Ambulatory Visit
Admission: RE | Admit: 2021-07-26 | Discharge: 2021-07-26 | Disposition: A | Discharge status: Home / Self Care | Payer: Medicare Other | Source: Ambulatory Visit | Attending: Urology | Admitting: Urology

## 2021-07-26 NOTE — Patient Instructions (Addendum)
Your procedure is scheduled on: 08/01/21 - Thursday Report to the Registration Desk on the 1st floor of the [facility]. To find out your arrival time, please call [phone] between 1PM - 3PM on: 07/31/21 - Wednesday  REMEMBER: Instructions that are not followed completely may result in serious medical risk, up to and including death; or upon the discretion of your surgeon and anesthesiologist your surgery may need to be rescheduled.  Do not eat food after midnight the night before surgery.  No gum chewing, lozengers or hard candies.  You may however, drink CLEAR liquids up to 2 hours before you are scheduled to arrive for your surgery. Do not drink anything within 2 hours of your scheduled arrival time.  Clear liquids include: - water  - apple juice without pulp - gatorade (not RED, PURPLE, OR BLUE) - black coffee or tea (Do NOT add milk or creamers to the coffee or tea) Do NOT drink anything that is not on this list.  TAKE THESE MEDICATIONS THE MORNING OF SURGERY WITH A SIP OF WATER:  - oxybutynin (DITROPAN-XL) 10 MG 24 hr tablet - methenamine (HIPREX) 1 g tablet - gabapentin (NEURONTIN) 800 MG tablet  One week prior to surgery: - meloxicam (MOBIC) 7.5 MG tablet Stop Anti-inflammatories (NSAIDS) such as Advil, Aleve, Ibuprofen, Motrin, Naproxen, Naprosyn and Aspirin based products such as Excedrin, Goodys Powder, BC Powder.  Stop ANY OVER THE COUNTER supplements until after surgery.  You may take Tylenol if needed for pain up until the day of surgery.  No Alcohol for 24 hours before or after surgery.  No Smoking including e-cigarettes for 24 hours prior to surgery.  No chewable tobacco products for at least 6 hours prior to surgery.  No nicotine patches on the day of surgery.  Do not use any "recreational" drugs for at least a week prior to your surgery.  Please be advised that the combination of cocaine and anesthesia may have negative outcomes, up to and including  death. If you test positive for cocaine, your surgery will be cancelled.  On the morning of surgery brush your teeth with toothpaste and water, you may rinse your mouth with mouthwash if you wish. Do not swallow any toothpaste or mouthwash.  Do not wear jewelry, make-up, hairpins, clips or nail polish.  Do not wear lotions, powders, or perfumes.   Do not shave body from the neck down 48 hours prior to surgery just in case you cut yourself which could leave a site for infection.  Also, freshly shaved skin may become irritated if using the CHG soap.  Contact lenses, hearing aids and dentures may not be worn into surgery.  Do not bring valuables to the hospital. The [facility] is not responsible for any missing/lost belongings or valuables.   Notify your doctor if there is any change in your medical condition (cold, fever, infection).  Wear comfortable clothing (specific to your surgery type) to the hospital.  After surgery, you can help prevent lung complications by doing breathing exercises.  Take deep breaths and cough every 1-2 hours. Your doctor may order a device called an Incentive Spirometer to help you take deep breaths. When coughing or sneezing, hold a pillow firmly against your incision with both hands. This is called splinting. Doing this helps protect your incision. It also decreases belly discomfort.  If you are being admitted to the hospital overnight, leave your suitcase in the car. After surgery it may be brought to your room.  If you  are being discharged the day of surgery, you will not be allowed to drive home. You will need a responsible adult (18 years or older) to drive you home and stay with you that night.   If you are taking public transportation, you will need to have a responsible adult (18 years or older) with you. Please confirm with your physician that it is acceptable to use public transportation.   Please call the [facility] at [phone] if you have any questions about these instructions.  Surgery Visitation Policy:  Patients undergoing a surgery or procedure may have one family member or support person with them as long as that person is not COVID-19 positive or experiencing its symptoms.  That person may remain in the waiting area during the procedure and may rotate out with other people.  Inpatient Visitation:    Visiting hours are 7 a.m. to 8 p.m. Up to two visitors ages 16+ are allowed at one time in a patient room. The visitors may rotate out with other people during the day. Visitors must check out when they leave, or other visitors will not be allowed. One designated support person may remain overnight. The visitor must pass COVID-19 screenings, use hand sanitizer when entering and exiting the patients room and wear a mask at all times, including in the patients room. Patients must also wear a mask when staff or their visitor are in the room. Masking is required regardless of vaccination status.

## 2021-07-28 NOTE — H&P (Signed)
NAME: [name], [name] MEDICAL RECORD NO: [id number] ACCOUNT NO: [id number] DATE OF BIRTH: January 10, 1951 FACILITY: [facility] LOCATION: [facility] PHYSICIAN: [name]. Yves Dill, MD  History and Physical   DATE OF ADMISSION: 08/01/2021  Same day surgery 08/01/2021  CHIEF COMPLAINT:  Urethral stricture disease.  HISTORY OF PRESENT ILLNESS:  The patient is a 71 year old white male with chronic urethral stricture disease that is managed with monthly sounding and intermittent self-catheterization.  He comes in now for Optilume procedure.  PAST MEDICAL HISTORY: ALLERGIES:  Included PENICILLIN, DOXYCYCLINE, CLINDAMYCIN and TRIMETHOPRIM with SULFA.  CURRENT MEDICATIONS:  Baclofen, methenamine, diclofenac, gabapentin, Senexon, Myrbetriq, losartan, gabapentin, bicalutamide, vitamin C, oxybutynin, losartan, polyethylene glycol and Lupron.  PAST MEDICAL HISTORY:  1.  Paraplegia. 2.  History of kidney stones. 3.  Prostate cancer. 4.  Hypertension. 5.  Hyperlipidemia. 6.  Neurogenic bladder. 7.  Urethral stricture disease.  SURGICAL HISTORY:  1.  Arthroscopic ACL repair on the right side in 1970s. 2.  Back surgery. 3.  Cataract surgery. 4.  Colonoscopy. 5. Spinal cord stimulator for control of spasms. 6.  Visual internal urethrotomy for stricture disease in 2013 and 2016. 7. Cystoscopy with litholapaxy and internal urethrotomy for bladder stones in 04/2017.  REVIEW OF SYSTEMS:  The patient denies chest pain, shortness of breath or stroke.  PHYSICAL EXAMINATION:   GENERAL:  Obese white male in no acute distress. HEENT:  Sclerae were clear.  Pupils are equally round, reactive to light and accommodation. NECK:  No palpable masses or tenderness. LUNGS:  Clear to auscultation. CARDIOVASCULAR:  Regular rhythm and rate without audible murmurs. ABDOMEN:  Soft, nontender.  No CVA tenderness. GENITOURINARY: Uncircumcised.  Testes were atrophic.  Rectal exam 20 gram smooth. Nontender  prostate. NEUROMUSCULAR EXAM: Consistent with paraplegia.  He was alert and oriented x3.  IMPRESSION:  Urethral stricture disease.  PLAN:  Optilume procedure.   PUS D: 07/26/2021 2:44:17 pm T: 07/26/2021 3:33:00 pm  JOB: [id number]/ [id number]

## 2021-08-01 ENCOUNTER — Ambulatory Visit: Payer: Medicare Other | Admitting: Certified Registered Nurse Anesthetist

## 2021-08-01 ENCOUNTER — Encounter
Admission: RE | Disposition: A | Discharge status: Home / Self Care | Payer: Self-pay | Source: Home / Self Care | Attending: Urology

## 2021-08-01 ENCOUNTER — Ambulatory Visit: Payer: Medicare Other

## 2021-08-01 ENCOUNTER — Encounter: Payer: Self-pay | Admitting: Urology

## 2021-08-01 ENCOUNTER — Ambulatory Visit
Admission: RE | Admit: 2021-08-01 | Discharge: 2021-08-01 | Disposition: A | Discharge status: Home / Self Care | Payer: Medicare Other | Attending: Urology | Admitting: Urology

## 2021-08-01 DIAGNOSIS — Z8546 Personal history of malignant neoplasm of prostate: Secondary | ICD-10-CM | POA: Insufficient documentation

## 2021-08-01 DIAGNOSIS — N319 Neuromuscular dysfunction of bladder, unspecified: Secondary | ICD-10-CM | POA: Insufficient documentation

## 2021-08-01 DIAGNOSIS — Z87442 Personal history of urinary calculi: Secondary | ICD-10-CM | POA: Diagnosis not present

## 2021-08-01 DIAGNOSIS — G8220 Paraplegia, unspecified: Secondary | ICD-10-CM | POA: Insufficient documentation

## 2021-08-01 DIAGNOSIS — N35914 Unspecified anterior urethral stricture, male: Secondary | ICD-10-CM | POA: Diagnosis not present

## 2021-08-01 DIAGNOSIS — N35919 Unspecified urethral stricture, male, unspecified site: Secondary | ICD-10-CM | POA: Diagnosis present

## 2021-08-01 DIAGNOSIS — Z01810 Encounter for preprocedural cardiovascular examination: Secondary | ICD-10-CM | POA: Diagnosis not present

## 2021-08-01 HISTORY — PX: CYSTOSCOPY WITH DIRECT VISION INTERNAL URETHROTOMY: SHX6637

## 2021-08-01 SURGERY — CYSTOSCOPY, WITH DIRECT VISION INTERNAL URETHROTOMY
Anesthesia: General

## 2021-08-01 MED ORDER — FAMOTIDINE 20 MG PO TABS: 20.0000 mg | ORAL_TABLET | Freq: Once | ORAL | Status: AC

## 2021-08-01 MED ORDER — ONDANSETRON HCL 4 MG/2ML IJ SOLN
INTRAMUSCULAR | Status: DC | PRN
  Administered 2021-08-01: 4 mg via INTRAVENOUS

## 2021-08-01 MED ORDER — FENTANYL CITRATE (PF) 100 MCG/2ML IJ SOLN
INTRAMUSCULAR | Status: AC
  Filled 2021-08-01: qty 2

## 2021-08-01 MED ORDER — BELLADONNA ALKALOIDS-OPIUM 16.2-60 MG RE SUPP
RECTAL | Status: DC | PRN
  Administered 2021-08-01: 1 via RECTAL

## 2021-08-01 MED ORDER — ONDANSETRON HCL 4 MG/2ML IJ SOLN
INTRAMUSCULAR | Status: AC
  Filled 2021-08-01: qty 2

## 2021-08-01 MED ORDER — CEFAZOLIN SODIUM-DEXTROSE 1-4 GM/50ML-% IV SOLN
1.0000 g | Freq: Once | INTRAVENOUS | Status: AC
  Administered 2021-08-01: 1 g via INTRAVENOUS

## 2021-08-01 MED ORDER — MIDAZOLAM HCL 2 MG/2ML IJ SOLN
INTRAMUSCULAR | Status: DC | PRN
  Administered 2021-08-01: 2 mg via INTRAVENOUS

## 2021-08-01 MED ORDER — IOHEXOL 180 MG/ML  SOLN
INTRAMUSCULAR | Status: DC | PRN
  Administered 2021-08-01: 10 mL

## 2021-08-01 MED ORDER — ORAL CARE MOUTH RINSE: 15.0000 mL | Freq: Once | OROMUCOSAL | Status: AC

## 2021-08-01 MED ORDER — LACTATED RINGERS IV SOLN: INTRAVENOUS | Status: DC

## 2021-08-01 MED ORDER — CHLORHEXIDINE GLUCONATE 0.12 % MT SOLN
OROMUCOSAL | Status: AC
  Administered 2021-08-01: 15 mL via OROMUCOSAL
  Filled 2021-08-01: qty 15

## 2021-08-01 MED ORDER — BELLADONNA ALKALOIDS-OPIUM 16.2-60 MG RE SUPP
RECTAL | Status: AC
  Filled 2021-08-01: qty 1

## 2021-08-01 MED ORDER — PHENYLEPHRINE HCL (PRESSORS) 10 MG/ML IV SOLN
INTRAVENOUS | Status: DC | PRN
  Administered 2021-08-01 (×2): 160 ug via INTRAVENOUS

## 2021-08-01 MED ORDER — MIDAZOLAM HCL 2 MG/2ML IJ SOLN
INTRAMUSCULAR | Status: AC
  Filled 2021-08-01: qty 2

## 2021-08-01 MED ORDER — LIDOCAINE HCL (CARDIAC) PF 100 MG/5ML IV SOSY
PREFILLED_SYRINGE | INTRAVENOUS | Status: DC | PRN
  Administered 2021-08-01: 100 mg via INTRAVENOUS

## 2021-08-01 MED ORDER — FAMOTIDINE 20 MG PO TABS
ORAL_TABLET | ORAL | Status: AC
  Administered 2021-08-01: 20 mg via ORAL
  Filled 2021-08-01: qty 1

## 2021-08-01 MED ORDER — CEFAZOLIN SODIUM-DEXTROSE 1-4 GM/50ML-% IV SOLN
INTRAVENOUS | Status: AC
  Filled 2021-08-01: qty 50

## 2021-08-01 MED ORDER — CHLORHEXIDINE GLUCONATE 0.12 % MT SOLN: 15.0000 mL | Freq: Once | OROMUCOSAL | Status: AC

## 2021-08-01 MED ORDER — PROPOFOL 10 MG/ML IV BOLUS
INTRAVENOUS | Status: DC | PRN
Start: 2021-08-01 — End: 2021-08-01
  Administered 2021-08-01: 150 mg via INTRAVENOUS

## 2021-08-01 MED ORDER — CIPROFLOXACIN HCL 500 MG PO TABS: 500.0000 mg | ORAL_TABLET | Freq: Two times a day (BID) | ORAL | 1 refills | Status: DC

## 2021-08-01 MED ORDER — WATER FOR IRRIGATION, STERILE IR SOLN
Status: DC | PRN
  Administered 2021-08-01: 3000 mL via INTRAVESICAL

## 2021-08-01 MED ORDER — PROPOFOL 10 MG/ML IV BOLUS
INTRAVENOUS | Status: AC
  Filled 2021-08-01: qty 20

## 2021-08-01 SURGICAL SUPPLY — 25 items
BAG DRAIN CYSTO-URO LG1000N (MISCELLANEOUS) ×2 IMPLANT
BAG DRN RND TRDRP ANRFLXCHMBR (UROLOGICAL SUPPLIES) ×1
BAG URINE DRAIN 2000ML AR STRL (UROLOGICAL SUPPLIES) ×2 IMPLANT
BALLN OPTILUME DCB 30X5X75 (BALLOONS) ×2
BALLOON OPTILUME DCB 30X5X75 (BALLOONS) IMPLANT
CATH FOLEY 2WAY  5CC 20FR SIL (CATHETERS)
CATH FOLEY 2WAY 5CC 20FR SIL (CATHETERS) ×1 IMPLANT
CATH FOLEY SIL 2WAY 14FR5CC (CATHETERS) ×1 IMPLANT
CATH ROBINSON RED A/P 10FR (CATHETERS) ×1 IMPLANT
GAUZE 4X4 16PLY ~~LOC~~+RFID DBL (SPONGE) ×3 IMPLANT
GLOVE SURG ENC MOIS LTX SZ7 (GLOVE) ×4 IMPLANT
GLOVE SURG ENC MOIS LTX SZ7.5 (GLOVE) ×2 IMPLANT
GLOVE SURG UNDER POLY LF SZ7 (GLOVE) ×2 IMPLANT
GOWN STRL REUS W/ TWL LRG LVL3 (GOWN DISPOSABLE) ×1 IMPLANT
GOWN STRL REUS W/ TWL XL LVL3 (GOWN DISPOSABLE) ×1 IMPLANT
GOWN STRL REUS W/TWL LRG LVL3 (GOWN DISPOSABLE) ×2
GOWN STRL REUS W/TWL XL LVL3 (GOWN DISPOSABLE) ×2
GUIDEWIRE STR DUAL SENSOR (WIRE) ×1 IMPLANT
KIT TURNOVER CYSTO (KITS) ×2 IMPLANT
PACK CYSTO AR (MISCELLANEOUS) ×2 IMPLANT
SET CYSTO W/LG BORE CLAMP LF (SET/KITS/TRAYS/PACK) ×2 IMPLANT
WATER STERILE IRR 1000ML POUR (IV SOLUTION) ×2 IMPLANT
WATER STERILE IRR 3000ML UROMA (IV SOLUTION) ×2 IMPLANT
WATER STERILE IRR 500ML POUR (IV SOLUTION) ×2 IMPLANT
WIRE G XSTIFF 025X145 (WIRE) ×2 IMPLANT

## 2021-08-01 NOTE — Transfer of Care (Signed)
Immediate Anesthesia Transfer of Care Note  Patient: [name]  Procedure(s) Performed: CYSTOSCOPY WITH DIRECT OPTILUMEVISION INTERNAL URETHROTOMY  Patient Location: PACU  Anesthesia Type:General  Level of Consciousness: sedated  Airway & Oxygen Therapy: Patient Spontanous Breathing and Patient connected to face mask oxygen  Post-op Assessment: Report given to RN and Post -op Vital signs reviewed and stable  Post vital signs: Reviewed and stable  Last Vitals:  Vitals Value Taken Time  BP 111/70   Temp    Pulse 74 08/01/21 1126  Resp    SpO2 96 % 08/01/21 1126  Vitals shown include unvalidated device data.  Last Pain:  Vitals:   08/01/21 1001  PainSc: 0-No pain         Complications: No notable events documented.

## 2021-08-01 NOTE — Op Note (Signed)
Preoperative diagnosis: 1.  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  Neurogenic bladder (N31)  Postoperative diagnosis: Same  Procedure:  1. OptiLume balloon dilatation of urethral stricture (CPT 5X000)                      2.  Foley catheter placement (CPT I1346205)                      3.  Fluoroscopy (CPT 76000)  Surgeon: [name]. Yves Dill MD  Anesthesia: General  Indications:See the history and physical. 71 year old (DOB 1951/04/07) white male with chronic urethral stricture disease that is managed with monthly sounding and intermittent self-catheterization.  He comes in now for Optilume procedure. After informed consent the above procedure(s) were requested     Technique and findings: After adequate general anesthesia obtained patient was placed into dorsal lithotomy position and the perineum was prepped and draped in the usual fashion.  The 21 French cystoscope was coupled to the camera and then advanced into the urethra.  Stricture was encountered in the mid anterior urethra.  A Glidewire was then passed through the stricture and curled into the bladder under fluoroscopic guidance.  The 108 French by 5 cm Optilume balloon catheter was selected and then visual and fluoroscopic guidance placed in the area of the stricture.  The balloon was then inflated to 10 atm of water pressure.  The balloon was inflated for a full 5 minutes and then deflated.  The balloon catheter was then removed taking care leave the guidewire in position.  Examination of the stricture site revealed that the stricture was widely patent.  The guidewire was then removed and a 14 French Foley catheter was placed.  A B&O suppository was placed.  The procedure was then terminated and patient transferred to the recovery room in stable condition.

## 2021-08-01 NOTE — Discharge Instructions (Addendum)
Urethral Stricture Urethral stricture is narrowing of the tube (urethra) that carries urine from the bladder out of the body. The urethra can become narrow due to scar tissue from an injury or infection. This can make it difficult to pass urine. In women, the urethra opens above the vaginal opening. In men, the urethra opens at the tip of the penis, and the urethra is much longer than it is in women. Because of the length of the male urethra, urethral stricture is much more common in men. This condition is treated with surgery. What are the causes? In both men and women, common causes of urethral stricture include: Urinary tract infection (UTI). Sexually transmitted infection (STI). Use of a tube placed into the urethra to drain urine from the bladder (urinary catheter). Urinary tract surgery. In men, common causes of urethral stricture include: A severe injury to the pelvis. Prostate surgery. Injury to the penis. In many cases, the cause of urethral stricture is not known. What increases the risk? You are more likely to develop this condition if you: Are male. Men who have had prostate surgery are at risk of developing this condition. Use a urinary catheter. Have had urinary tract surgery. What are the signs or symptoms? The main symptom of this condition is difficulty passing urine. This may cause decreased urine flow, dribbling, or spraying of urine. Other symptom of this condition may include: Frequent UTIs. Blood in the urine. Pain when urinating. Swelling of the penis in men. Inability to pass urine (urinary obstruction). How is this diagnosed? This condition may be diagnosed based on: Your medical history and a physical exam. Urine tests to check for infection or bleeding. X-rays. Ultrasound. Retrograde urethrogram. This is a type of test in which dye is injected into the urethra and then an X-ray is taken. Urethroscopy. This is when a thin tube with a light and camera on the  end (urethroscope) is used to look at the urethra. How is this treated? This condition is treated with surgery. The type of surgery that you have depends on the severity of your condition. You may have: Urethral dilation. In this procedure, the narrow part of the urethra is stretched open (dilated) with dilating instruments or a small balloon. Urethrotomy. In this procedure, a urethroscope is placed into the urethra, and the narrow part of the urethra is cut open with a surgical blade inserted through the urethroscope. Open surgery. In this procedure, an incision is made in the urethra, the narrow part is removed, and the urethra is reconstructed. Follow these instructions at home: Take over-the-counter and prescription medicines only as told by your health care provider. If you were prescribed an antibiotic medicine, take it as told by your health care provider. Do not stop taking the antibiotic even if you start to feel better. Drink enough fluid to keep your urine pale yellow. Keep all follow-up visits as told by your health care provider. This is important. Contact a health care provider if: You have signs of a urinary tract infection, such as: Frequent urination or passing small amounts of urine frequently. Needing to urinate urgently. Pain or burning with urination. Urine that smells bad or unusual. Cloudy urine. Pain in the lower abdomen or back. Trouble urinating. Blood in the urine. Vomiting or being less hungry than normal. Diarrhea or abdominal pain. Vaginal discharge, if you are male. Your symptoms are getting worse instead of better. Get help right away if: You cannot pass urine. You have a fever. You have  swelling, bruising, or discoloration of your genital area. This includes the penis, scrotum, and inner thighs for men, and the outer genital organs (vulva) and inner thighs for women. You develop swelling in your legs. You have difficulty breathing. Summary Urethral  stricture is narrowing of the tube (urethra) that carries urine from the bladder out of the body. The urethra can become narrow due to scar tissue from an injury or infection. This condition can make it difficult to pass urine. This condition is treated with surgery. The type of surgery that you have depends on the severity of your condition. Contact a health care provider if your symptoms get worse or you have signs of a urinary tract infection. This information is not intended to replace advice given to you by your health care provider. Make sure you discuss any questions you have with your health care provider. Document Revised: 02/17/2018 Document Reviewed: 02/17/2018 Elsevier Patient Education  2022 [place] After This sheet gives you information about how to care for yourself after your procedure. Your health care provider may also give you more specific instructions. If you have problems or questions, contact your health care provider. What can I expect after the procedure? After the procedure, it is common to have: Burning pain when urinating. Pain or discomfort in your genital area. A small amount of blood in your urine. Your health care provider will tell you how long you can expect to have blood in your urine. Bloody urine leaking from around your catheter. Follow these instructions at home: Catheter and drainage bag Follow instructions from your health care provider about how to care for your catheter and your drainage bag. Do not take baths, swim, or use a hot tub until your catheter has been removed. You may take showers while your catheter is in place. If you have to insert a catheter on your own (self-catheterization) after your catheter is removed, make sure you understand the procedure completely. Carefully follow instructions from your health care provider. Medicines Take over-the-counter and prescription medicines only as told by your health care  provider. If you were prescribed an antibiotic medicine, take it as told by your health care provider. Do not stop taking the antibiotic even if you start to feel better. Ask your health care provider if the medicine prescribed to you: Requires you to avoid driving or using heavy machinery. Can cause constipation. You may need to take these actions to prevent or treat constipation: Take over-the-counter or prescription medicines. Eat foods that are high in fiber, such as beans, whole grains, and fresh fruits and vegetables. Limit foods that are high in fat and processed sugars, such as fried or sweet foods. Activity Do not drive for 24 hours if you were given a sedative during your procedure. Take short walks several times a day during your recovery. Do not lift anything that is heavier than 10 lb (4.5 kg), or the limit that you are told, until your health care provider says that it is safe. Return to your normal activities as told by your health care provider. Ask your health care provider what activities are safe for you. Do not have sex until your health care provider says it is okay. General instructions Drink enough fluid to keep your urine pale yellow. If a bandage (dressing) was applied over the opening of your urethra, change the dressing as told by your health care provider. Make sure you: Wash your hands with soap and water  before and after you change your dressing. If soap and water are not available, use hand sanitizer. Keep your dressing clean and dry. Do not use any products that contain nicotine or tobacco, such as cigarettes, e-cigarettes, and chewing tobacco. These can delay healing after surgery. If you need help quitting, ask your health care provider. Keep all follow-up visits as told by your health care provider. This is important. Contact a health care provider if you: Have a fever or chills. Have pain that gets worse or does not get better with medicine. Have blood in  your urine for longer than your health care provider told you to expect. Are a male and have any of these problems: Trouble getting an erection. Pain when you have an erection. Blood in your semen. Have any of these problems after your catheter is removed: Trouble urinating. A slow urine stream. Urinating less than usual. Several streams or "spray" when you urinate. Have pain in your abdomen. Have swelling in your genital area that does not go away. Get help right away if: You have severe pain. A lot of blood is leaking from around your catheter. You have blood clots in your urine. Your catheter stops draining urine. You cannot urinate after your catheter is removed. You have redness, warmth, or pain in your leg. You have chest pain. You have trouble breathing. These symptoms may represent a serious problem that is an emergency. Do not wait to see if the symptoms will go away. Get medical help right away. Call your local emergency services (911 in the [place] Do not drive yourself to the hospital. Summary After the procedure, it is common to have burning pain when urinating and a small amount of blood in your urine. Follow instructions from your health care provider about how to care for your catheter and your drainage bag. Take short walks several times a day during your recovery. If a bandage (dressing) was applied over the opening of your urethra, change the dressing as told by your health care provider. Contact your health care provider if you have trouble urinating after your catheter is removed. This information is not intended to replace advice given to you by your health care provider. Make sure you discuss any questions you have with your health care provider. Document Revised: 01/10/2019 Document Reviewed: 01/10/2019 Elsevier Patient Education  2022 [place] After This sheet gives you information about how to care for yourself after your  procedure. Your health care provider may also give you more specific instructions. If you have problems or questions, contact your health care provider. What can I expect after the procedure? After the procedure, it is common to have: Burning pain when urinating. Pain or discomfort in your genital area. A small amount of blood in your urine. Your health care provider will tell you how long you can expect to have blood in your urine. Bloody urine leaking from around your catheter. Follow these instructions at home: Catheter and drainage bag Follow instructions from your health care provider about how to care for your catheter and your drainage bag. Do not take baths, swim, or use a hot tub until your catheter has been removed. You may take showers while your catheter is in place. If you have to insert a catheter on your own (self-catheterization) after your catheter is removed, make sure you understand the procedure completely. Carefully follow instructions from your health care provider. Medicines Take over-the-counter and prescription medicines only as  told by your health care provider. If you were prescribed an antibiotic medicine, take it as told by your health care provider. Do not stop taking the antibiotic even if you start to feel better. Ask your health care provider if the medicine prescribed to you: Requires you to avoid driving or using heavy machinery. Can cause constipation. You may need to take these actions to prevent or treat constipation: Take over-the-counter or prescription medicines. Eat foods that are high in fiber, such as beans, whole grains, and fresh fruits and vegetables. Limit foods that are high in fat and processed sugars, such as fried or sweet foods. Activity Do not drive for 24 hours if you were given a sedative during your procedure. Take short walks several times a day during your recovery. Do not lift anything that is heavier than 10 lb (4.5 kg), or the  limit that you are told, until your health care provider says that it is safe. Return to your normal activities as told by your health care provider. Ask your health care provider what activities are safe for you. Do not have sex until your health care provider says it is okay. General instructions Drink enough fluid to keep your urine pale yellow. If a bandage (dressing) was applied over the opening of your urethra, change the dressing as told by your health care provider. Make sure you: Wash your hands with soap and water before and after you change your dressing. If soap and water are not available, use hand sanitizer. Keep your dressing clean and dry. Do not use any products that contain nicotine or tobacco, such as cigarettes, e-cigarettes, and chewing tobacco. These can delay healing after surgery. If you need help quitting, ask your health care provider. Keep all follow-up visits as told by your health care provider. This is important. Contact a health care provider if you: Have a fever or chills. Have pain that gets worse or does not get better with medicine. Have blood in your urine for longer than your health care provider told you to expect. Are a male and have any of these problems: Trouble getting an erection. Pain when you have an erection. Blood in your semen. Have any of these problems after your catheter is removed: Trouble urinating. A slow urine stream. Urinating less than usual. Several streams or "spray" when you urinate. Have pain in your abdomen. Have swelling in your genital area that does not go away. Get help right away if: You have severe pain. A lot of blood is leaking from around your catheter. You have blood clots in your urine. Your catheter stops draining urine. You cannot urinate after your catheter is removed. You have redness, warmth, or pain in your leg. You have chest pain. You have trouble breathing. These symptoms may represent a serious  problem that is an emergency. Do not wait to see if the symptoms will go away. Get medical help right away. Call your local emergency services (911 in the [place] Do not drive yourself to the hospital. Summary After the procedure, it is common to have burning pain when urinating and a small amount of blood in your urine. Follow instructions from your health care provider about how to care for your catheter and your drainage bag. Take short walks several times a day during your recovery. If a bandage (dressing) was applied over the opening of your urethra, change the dressing as told by your health care provider. Contact your health care provider if you have trouble  urinating after your catheter is removed. This information is not intended to replace advice given to you by your health care provider. Make sure you discuss any questions you have with your health care provider. Document Revised: 01/10/2019 Document Reviewed: 01/10/2019 Elsevier Patient Education  [place], Adult An indwelling urinary catheter is a thin tube that is put into your bladder. The tube helps to drain pee (urine) out of your body. The tube goes in through your urethra. Your urethra is where pee comes out of your body. Your pee will come out through the catheter, then it will go into a bag (drainage bag). Take good care of your catheter so it will work well. What are the risks? Germs may get into your bladder and cause an infection. The tube can become blocked. Tissue near the catheter may become irritated and may bleed. How to wear your catheter and drainage bag Supplies needed Sticky tape (adhesive tape) or a leg strap. Alcohol wipe or soap and water (if you use tape). A clean towel (if you use tape). Large overnight bag. Smaller bag (leg bag). Wearing your catheter Attach your catheter to your leg with tape or a leg strap. Make sure the catheter is not pulled  tight. If a leg strap gets wet, take it off and put on a dry strap. If you use tape to hold the bag on your leg: Use an alcohol wipe or soap and water to wash your skin where the tape made it sticky before. Use a clean towel to pat-dry that skin. Use new tape to make the bag stay on your leg. Wearing your bags You should have been given a large overnight bag. You may wear the overnight bag in the day or night. Always have the overnight bag lower than your bladder.  Do not let the bag touch the floor. Before you go to sleep, put a clean plastic bag in a wastebasket. Then, hang the overnight bag inside the wastebasket. You should also have a smaller leg bag that fits under your clothes. Wear the leg bag as told by the product maker. This may be above or below the knee, depending on the length of the tubing. Make sure that the leg bag is below the bladder. Make sure that the tubing does not have loops or too much tension. Do not wear your leg bag at night. How to care for your skin and catheter Supplies needed A clean washcloth. Water and mild soap. A clean towel. Caring for your skin and catheter Male anatomy showing the labia, urethra, and an indwelling urinary catheter in the bladder.    Male anatomy showing the penis, urethra, and an indwelling urinary catheter in the bladder.  Clean the skin around your catheter every day. Wash your hands with soap and water. Wet a clean washcloth in warm water and mild soap. Clean the skin around your urethra. If you are male: Gently spread the folds of skin around your vagina (labia). With the washcloth in your other hand, wipe the inner side of your labia on each side. Wipe from front to back. If you are male: Pull back any skin that covers the end of your penis (foreskin). With the washcloth in your other hand, wipe your penis in small circles. Start wiping at the tip of your penis, then move away from the catheter. Move the foreskin  back in place, if needed. With your free hand, hold the  catheter close to where it goes into your body. Keep holding the catheter during cleaning so it does not get pulled out. With the washcloth in your other hand, clean the catheter. Only wipe downward on the catheter, toward the drainage bag. Do not wipe upward toward your body. Doing this may push germs into your urethra and cause infection. Use a clean towel to pat-dry the catheter and the skin around it. Make sure to wipe off all soap. Wash your hands with soap and water. Shower every day. Do not take baths. Do not use cream, ointment, or lotion on the area where the catheter goes into your body, unless your doctor tells you to. Do not use powders, sprays, or lotions on your genital area. Check your skin around the catheter every day for signs of infection. Check for: Redness, swelling, or pain. Fluid or blood. Warmth. Pus or a bad smell. How to empty the bag Supplies needed Rubbing alcohol. Gauze pad or cotton ball. Tape or a leg strap. Emptying the bag Pour the pee out of your bag when it is ?- full, or at least 2-3 times a day. Do this for your overnight bag and your leg bag. Wash your hands with soap and water. Separate (detach) the bag from your leg. Hold the bag over the toilet or a clean pail. Keep the bag lower than your hips and bladder. This is so the pee (urine) does not go back into the tube. Open the pour spout. It is at the bottom of the bag. Empty the pee into the toilet or pail. Do not let the pour spout touch any surface. Put rubbing alcohol on a gauze pad or cotton ball. Use the gauze pad or cotton ball to clean the pour spout. Close the pour spout. Attach the bag to your leg with tape or a leg strap. Wash your hands with soap and water. Follow instructions for cleaning the drainage bag. Instructions can come from: The product maker. Your doctor. How to change the bag Changing the bag Replace your bag  when it starts to leak, smell bad, or look dirty. Wash your hands with soap and water. Separate the dirty bag from your leg. Pinch the catheter with your fingers so that pee does not spill out. Separate the catheter tube from the bag tube where these tubes connect (at the connection valve). Do not let the tubes touch any surface. Clean the end of the catheter tube with an alcohol wipe. Use a different alcohol wipe to clean the end of the bag tube. Connect the catheter tube to the tube of the clean bag. Attach the clean bag to your leg with tape or a leg strap. Do not make the bag tight on your leg. Wash your hands with soap and water. General instructions A person washing hands with soap and water.  Never pull on your catheter. Never try to take it out. Doing that can hurt you. Always wash your hands before and after you touch your catheter or bag. Use a mild, fragrance-free soap. If you do not have soap and water, use hand sanitizer. Always make sure there are no twists, bends, or kinks in the catheter tube. Always make sure there are no leaks in the catheter or bag. Drink enough fluid to keep your pee pale yellow. Do not take baths, swim, or use a hot tub. If you are male, wipe from front to back after you poop (have a bowel movement). Contact a doctor if:  Your catheter gets clogged. Your catheter leaks. You have signs of infection at the catheter site, such as: Redness, swelling, or pain where the catheter goes into your body. Fluid, blood, pus, or a bad smell coming from the area where the catheter goes into your body. Skin feels warm where the catheter goes into your body. You have signs of a bladder infection, such as: Fever. Chills. Pee smells worse than usual. Cloudy pee. Pain in your belly, legs, lower back, or bladder. Vomiting or feel like vomiting. Get help right away if: You see blood in the catheter. Your pee is pink or red. Your bladder feels full. Your pee is  not draining into the bag. Your catheter gets pulled out. Summary An indwelling urinary catheter is a thin tube that is placed into the bladder to help drain pee (urine) out of the body. The catheter is placed into the part of the body that drains pee from the bladder (urethra). Taking good care of your catheter will keep it working well. Always wash your hands before and after touching your catheter or bag. Never pull on your catheter or try to take it out. This information is not intended to replace advice given to you by your health care provider. Make sure you discuss any questions you have with your health care provider. Document Revised: 03/07/2021 Document Reviewed: 03/07/2021 Elsevier Patient Education  2022 [place]   The drugs that you were given will stay in your system until tomorrow so for the next 24 hours you should not:  Drive an automobile Make any legal decisions Drink any alcoholic beverage   You may resume regular meals tomorrow.  Today it is better to start with liquids and gradually work up to solid foods.  You may eat anything you prefer, but it is better to start with liquids, then soup and crackers, and gradually work up to solid foods.   Please notify your doctor immediately if you have any unusual bleeding, trouble breathing, redness and pain at the surgery site, drainage, fever, or pain not relieved by medication.    Additional Instructions:    Please contact your physician with any problems or Same Day Surgery at [phone], Monday through Friday 6 am to 4 pm, or [place] at [facility] number at [phone].

## 2021-08-01 NOTE — H&P (Signed)
Date of Initial H&P: 07/26/21  History reviewed, patient examined, no change in status, stable for surgery.

## 2021-08-01 NOTE — Anesthesia Preprocedure Evaluation (Signed)
Anesthesia Evaluation  Patient identified by MRN, date of birth, ID band Patient awake    Reviewed: Allergy & Precautions, NPO status , Patient's Chart, lab work & pertinent test results  History of Anesthesia Complications Negative for: history of anesthetic complications  Airway Mallampati: III  TM Distance: >3 FB Neck ROM: Full    Dental  (+) Partial Upper, Partial Lower, Dental Advidsory Given   Pulmonary neg pulmonary ROS, neg shortness of breath, neg sleep apnea, neg COPD, neg recent URI, Patient abstained from smoking.Not current smoker,    Pulmonary exam normal breath sounds clear to auscultation       Cardiovascular Exercise Tolerance: Poor METShypertension, Pt. on medications (-) angina(-) CAD, (-) Past MI and (-) CHF (-) dysrhythmias (-) Valvular Problems/Murmurs Rhythm:Regular Rate:Normal - Systolic murmurs    Neuro/Psych neg Seizures L2 parapelegic  Neuromuscular disease negative psych ROS   GI/Hepatic Neg liver ROS, neg GERD  ,  Endo/Other  neg diabetes  Renal/GU negative Renal ROS     Musculoskeletal   Abdominal   Peds  Hematology   Anesthesia Other Findings Past Medical History: No date: AVM (arteriovenous malformation) spine No date: Chronic pain disorder     Comment:  works with the [facility] for intrathecal              pump meds and stimulator 2022: Decubitus ulcer of buttock     Comment:  recently had skin flap surgery to use his own skin to               cover pressure sore.  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 No date: Prostate cancer ([place])     Comment:   taking Casodex  (hormone chemotherapy) No date: Status post insertion of intrathecal baclofen pump No date: Status post insertion of spinal cord stimulator  Reproductive/Obstetrics                             Anesthesia Physical  Anesthesia Plan  ASA: 3  Anesthesia Plan: General   Post-op Pain Management:    Induction: Intravenous  PONV Risk Score and Plan: 2 and Dexamethasone and Ondansetron  Airway Management Planned: LMA and Oral ETT  Additional Equipment: None  Intra-op Plan:   Post-operative Plan: Extubation in OR  Informed Consent: I have reviewed the patients History and Physical, chart, labs and discussed the procedure including the risks, benefits and alternatives for the proposed anesthesia with the patient or authorized representative who has indicated his/her understanding and acceptance.     Dental advisory given  Plan Discussed with: CRNA and Surgeon  Anesthesia Plan Comments: (Discussed risks of anesthesia with patient, including PONV, sore throat, lip/dental damage. Rare risks discussed as well, such as cardiorespiratory and neurological sequelae. Patient understands. Will turn spinal cord stimulator off, and resume it after surgery)        Anesthesia Quick Evaluation

## 2021-08-01 NOTE — Anesthesia Procedure Notes (Signed)
Procedure Name: LMA Insertion Date/Time: 08/01/2021 10:48 AM Performed by: Aline Brochure, CRNA Pre-anesthesia Checklist: Patient identified, Patient being monitored, Timeout performed, Emergency Drugs available and Suction available Patient Re-evaluated:Patient Re-evaluated prior to induction Oxygen Delivery Method: Circle system utilized Preoxygenation: Pre-oxygenation with 100% oxygen Induction Type: IV induction Ventilation: Mask ventilation without difficulty LMA: LMA inserted LMA Size: 4.0 Tube type: Oral Number of attempts: 1 Placement Confirmation: positive ETCO2 and breath sounds checked- equal and bilateral Tube secured with: Tape Dental Injury: Teeth and Oropharynx as per pre-operative assessment

## 2021-08-03 NOTE — Anesthesia Postprocedure Evaluation (Addendum)
Anesthesia Post Note  Patient: [name]  Procedure(s) Performed: CYSTOSCOPY WITH DIRECT OPTILUMEVISION INTERNAL URETHROTOMY  Patient location during evaluation: PACU Anesthesia Type: General Level of consciousness: awake and alert Pain management: pain level controlled Vital Signs Assessment: post-procedure vital signs reviewed and stable Respiratory status: spontaneous breathing, nonlabored ventilation, respiratory function stable and patient connected to nasal cannula oxygen Cardiovascular status: blood pressure returned to baseline and stable Postop Assessment: no apparent nausea or vomiting Anesthetic complications: no   No notable events documented.   Last Vitals:  Vitals:   08/01/21 1215 08/01/21 1228  BP: (!) 156/91 (!) 160/91  Pulse: 66 87  Resp: 11 16  Temp: (!) 35.9 C (!) 36.1 C  SpO2: 98% 100%    Last Pain:  Vitals:   08/02/21 0911  TempSrc:   PainSc: 7                  Martha Clan

## 2022-04-07 ENCOUNTER — Other Ambulatory Visit: Payer: Self-pay | Admitting: Urology

## 2022-04-07 DIAGNOSIS — M5489 Other dorsalgia: Secondary | ICD-10-CM

## 2022-04-07 DIAGNOSIS — N200 Calculus of kidney: Secondary | ICD-10-CM

## 2022-04-08 ENCOUNTER — Ambulatory Visit
Admission: RE | Admit: 2022-04-08 | Discharge: 2022-04-08 | Disposition: A | Discharge status: Home / Self Care | Payer: Medicare Other | Source: Ambulatory Visit | Attending: Urology | Admitting: Urology

## 2022-04-08 DIAGNOSIS — M5489 Other dorsalgia: Secondary | ICD-10-CM | POA: Diagnosis present

## 2022-04-08 DIAGNOSIS — N200 Calculus of kidney: Secondary | ICD-10-CM | POA: Diagnosis present

## 2022-04-08 LAB — POCT I-STAT CREATININE: Creatinine, Ser: 0.7 mg/dL (ref 0.61–1.24)

## 2022-04-08 MED ORDER — IOHEXOL 300 MG/ML  SOLN
100.0000 mL | Freq: Once | INTRAMUSCULAR | Status: AC | PRN
  Administered 2022-04-08: 100 mL via INTRAVENOUS

## 2022-05-08 ENCOUNTER — Encounter
Admission: RE | Admit: 2022-05-08 | Discharge: 2022-05-08 | Disposition: A | Discharge status: Home / Self Care | Payer: Medicare Other | Source: Ambulatory Visit | Attending: Urology | Admitting: Urology

## 2022-05-08 VITALS — Ht 73.0 in

## 2022-05-08 DIAGNOSIS — Z01818 Encounter for other preprocedural examination: Secondary | ICD-10-CM

## 2022-05-08 DIAGNOSIS — I10 Essential (primary) hypertension: Secondary | ICD-10-CM

## 2022-05-08 HISTORY — DX: Neurogenic bowel, not elsewhere classified: K59.2

## 2022-05-08 HISTORY — DX: Unspecified injury to unspecified level of lumbar spinal cord, initial encounter: S34.109A

## 2022-05-08 HISTORY — DX: Sepsis, unspecified organism: A41.9

## 2022-05-08 NOTE — Patient Instructions (Signed)
Your procedure is scheduled on:05-15-22 Thursday Report to the Registration Desk on the 1st floor of the [facility].Then proceed to the 2nd floor Surgery Desk To find out your arrival time, please call [phone] between 1PM - 3PM on:05-14-22 Wednesday If your arrival time is 6:00 am, do not arrive prior to that time as the [facility] entrance doors do not open until 6:00 am.  REMEMBER: Instructions that are not followed completely may result in serious medical risk, up to and including death; or upon the discretion of your surgeon and anesthesiologist your surgery may need to be rescheduled.  Do not eat food after midnight the night before surgery.  No gum chewing, lozengers or hard candies.  You may however, drink CLEAR liquids up to 2 hours before you are scheduled to arrive for your surgery. Do not drink anything within 2 hours of your scheduled arrival time.  Clear liquids include: - water  - apple juice without pulp - gatorade (not RED colors) - black coffee or tea (Do NOT add milk or creamers to the coffee or tea) Do NOT drink anything that is not on this list.  TAKE THESE MEDICATIONS THE MORNING OF SURGERY WITH A SIP OF WATER: -gabapentin (NEURONTIN) -methenamine (HIPREX)  Bring your Spinal Cord Stimulator Remote to the hospital the day of surgery  One week prior to surgery: Stop Anti-inflammatories (NSAIDS) such as Advil, Aleve, Ibuprofen, Motrin, Naproxen, Naprosyn and Aspirin based products such as Excedrin, Goodys Powder, BC Powder.You may however, continue to take Tylenol if needed for pain up until the day of surgery  Stop [place] supplements/vitamins NOW (05-08-22) until after surgery (Vitamin C, B, Multivitamin and Fish Oil)  No Alcohol for 24 hours before or after surgery.  No Smoking including e-cigarettes for 24 hours prior to surgery.  No chewable tobacco products for at least 6 hours prior to surgery.  No nicotine patches on the day of  surgery.  Do not use any "recreational" drugs for at least a week prior to your surgery.  Please be advised that the combination of cocaine and anesthesia may have negative outcomes, up to and including death. If you test positive for cocaine, your surgery will be cancelled.  On the morning of surgery brush your teeth with toothpaste and water, you may rinse your mouth with mouthwash if you wish. Do not swallow any toothpaste or mouthwash.  Do not wear jewelry, make-up, hairpins, clips or nail polish.  Do not wear lotions, powders, or perfumes.   Do not shave body from the neck down 48 hours prior to surgery just in case you cut yourself which could leave a site for infection.  Also, freshly shaved skin may become irritated if using the CHG soap.  Contact lenses, hearing aids and dentures may not be worn into surgery.  Do not bring valuables to the hospital. [facility], [facility], [facility] is not responsible for any missing/lost belongings or valuables.   Notify your doctor if there is any change in your medical condition (cold, fever, infection).  Wear comfortable clothing (specific to your surgery type) to the hospital.  After surgery, you can help prevent lung complications by doing breathing exercises.  Take deep breaths and cough every 1-2 hours. Your doctor may order a device called an Incentive Spirometer to help you take deep breaths. When coughing or sneezing, hold a pillow firmly against your incision with both hands. This is called "splinting." Doing this helps protect your incision. It also decreases belly discomfort.  If you are being admitted to the hospital overnight, leave your suitcase in the car. After surgery it may be brought to your room.  If you are being discharged the day of surgery, you will not be allowed to drive home. You will need a responsible adult (18 years or older) to drive you home and stay with you that night.   If you are taking public transportation, you will need  to have a responsible adult (18 years or older) with you. Please confirm with your physician that it is acceptable to use public transportation.   Please call the [facility]. at [phone] if you have any questions about these instructions.  Surgery Visitation Policy:  Patients undergoing a surgery or procedure may have two family members or support persons with them as long as the person is not COVID-19 positive or experiencing its symptoms.

## 2022-05-09 NOTE — H&P (Unsigned)
NAME: [name], [name] MEDICAL RECORD NO: [id number] ACCOUNT NO: [id number] DATE OF BIRTH: Jul 08, 1951 FACILITY: [facility] LOCATION: [facility] PHYSICIAN: [name]. Yves Dill, MD  History and Physical   DATE OF ADMISSION: 05/15/2022  Same-day surgery 05/15/2022.  CHIEF COMPLAINT:  Difficulty performing self-catheterization due to urethral stricture.  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  He has had difficulty performing self-catheterization with a 16-French Foley recently and was  found to have a urethral stricture.  In the past, he has required dilatation of the stricture in the office monthly, but did quite well with an Optilume procedure in March of this year.  He comes in now for Optilume procedure.  PAST MEDICAL HISTORY: ALLERGIES:  PENICILLIN AND SULFA.  CURRENT MEDICATIONS:  Gabapentin, methenamine, Gemtesa, losartan, baclofen and Dilaudid.  PAST SURGICAL HISTORY:  1.  Arthroscopic ACL repair of the right side in 1970s. 2.  Lumbar back surgery. 3.  Cataract surgery. 4.  Colonoscopy. 5.  Spinal cord stimulator for control of spasms. 6.  Visual internal urethrotomy for urethral stricture disease in 2013 and 2016. 7.  Cystoscopy with litholapaxy and internal urethrotomy 2018. 8.  Optilume procedure, January 2023.  PAST AND CURRENT MEDICAL CONDITIONS:  1.  Paraplegia. 2.  Neurogenic bladder. 3.  History of kidney stones. 4.  Prostate cancer. 5.  Hypertension. 6.  Hyperlipidemia.  REVIEW OF SYSTEMS:  The patient denies chest pain, shortness of breath or stroke.  PHYSICAL EXAMINATION:   GENERAL:  Obese white male in no distress. HEENT:  Sclerae were clear.  Pupils are equally round, reactive to light and accommodation.  Extraocular movements are intact. NECK:  No palpable masses or tenderness. LYMPHATIC:  No palpable cervical or inguinal  adenopathy. PULMONARY:  Lungs are clear to auscultation. CARDIOVASCULAR:  Regular rhythm and rate. ABDOMEN:  Soft, nontender abdomen.  No CVA tenderness. GENITOURINARY:  Uncircumcised with atrophic testes.  Rectal exam 20 gram flat, smooth, nontender prostate. NEUROMUSCULAR: Consistent with paraplegia.  He is alert and oriented x3.  IMPRESSION:  Urethral stricture disease.  PLAN:  Optilume urethrotomy.   PUS D: 05/08/2022 4:29:51 pm T: 05/08/2022 6:27:00 pm  JOB: [id number]/ [id number]

## 2022-05-12 ENCOUNTER — Encounter
Admission: RE | Admit: 2022-05-12 | Discharge: 2022-05-12 | Disposition: A | Discharge status: Home / Self Care | Payer: Medicare Other | Source: Ambulatory Visit | Attending: Urology | Admitting: Urology

## 2022-05-12 DIAGNOSIS — Z01818 Encounter for other preprocedural examination: Secondary | ICD-10-CM | POA: Insufficient documentation

## 2022-05-12 DIAGNOSIS — I10 Essential (primary) hypertension: Secondary | ICD-10-CM | POA: Diagnosis not present

## 2022-05-12 LAB — CBC
HCT: 43.1 % (ref 39.0–52.0)
Hemoglobin: 14.4 g/dL (ref 13.0–17.0)
MCH: 28.2 pg (ref 26.0–34.0)
MCHC: 33.4 g/dL (ref 30.0–36.0)
MCV: 84.5 fL (ref 80.0–100.0)
Platelets: 161 10*3/uL (ref 150–400)
RBC: 5.1 MIL/uL (ref 4.22–5.81)
RDW: 12.1 % (ref 11.5–15.5)
WBC: 4.3 10*3/uL (ref 4.0–10.5)
nRBC: 0 % (ref 0.0–0.2)

## 2022-05-12 LAB — BASIC METABOLIC PANEL
Anion gap: 8 (ref 5–15)
BUN: 19 mg/dL (ref 8–23)
CO2: 28 mmol/L (ref 22–32)
Calcium: 9.3 mg/dL (ref 8.9–10.3)
Chloride: 104 mmol/L (ref 98–111)
Creatinine, Ser: 0.61 mg/dL (ref 0.61–1.24)
GFR, Estimated: >60 mL/min (ref >60)
Glucose, Bld: 102 mg/dL — ABNORMAL HIGH (ref 70–99)
Potassium: 4 mmol/L (ref 3.5–5.1)
Sodium: 140 mmol/L (ref 135–145)

## 2022-05-15 ENCOUNTER — Ambulatory Visit: Payer: Medicare Other

## 2022-05-15 ENCOUNTER — Ambulatory Visit: Payer: Medicare Other | Admitting: Anesthesiology

## 2022-05-15 ENCOUNTER — Ambulatory Visit: Payer: Medicare Other | Admitting: Urgent Care

## 2022-05-15 ENCOUNTER — Ambulatory Visit
Admission: RE | Admit: 2022-05-15 | Discharge: 2022-05-15 | Disposition: A | Discharge status: Home / Self Care | Payer: Medicare Other | Source: Ambulatory Visit | Attending: Urology | Admitting: Urology

## 2022-05-15 ENCOUNTER — Encounter: Payer: Self-pay | Admitting: Urology

## 2022-05-15 ENCOUNTER — Encounter
Admission: RE | Disposition: A | Discharge status: Home / Self Care | Payer: Self-pay | Source: Ambulatory Visit | Attending: Urology

## 2022-05-15 ENCOUNTER — Other Ambulatory Visit: Payer: Self-pay

## 2022-05-15 DIAGNOSIS — N35919 Unspecified urethral stricture, male, unspecified site: Secondary | ICD-10-CM | POA: Insufficient documentation

## 2022-05-15 DIAGNOSIS — Z7969 Long term (current) use of other immunomodulators and immunosuppressants: Secondary | ICD-10-CM | POA: Insufficient documentation

## 2022-05-15 DIAGNOSIS — C61 Malignant neoplasm of prostate: Secondary | ICD-10-CM | POA: Diagnosis not present

## 2022-05-15 DIAGNOSIS — Z79899 Other long term (current) drug therapy: Secondary | ICD-10-CM | POA: Diagnosis not present

## 2022-05-15 DIAGNOSIS — G8929 Other chronic pain: Secondary | ICD-10-CM | POA: Insufficient documentation

## 2022-05-15 DIAGNOSIS — Z87442 Personal history of urinary calculi: Secondary | ICD-10-CM | POA: Diagnosis not present

## 2022-05-15 DIAGNOSIS — N35819 Other urethral stricture, male, unspecified site: Secondary | ICD-10-CM

## 2022-05-15 DIAGNOSIS — N319 Neuromuscular dysfunction of bladder, unspecified: Secondary | ICD-10-CM | POA: Diagnosis not present

## 2022-05-15 DIAGNOSIS — I10 Essential (primary) hypertension: Secondary | ICD-10-CM | POA: Insufficient documentation

## 2022-05-15 DIAGNOSIS — G8220 Paraplegia, unspecified: Secondary | ICD-10-CM | POA: Diagnosis not present

## 2022-05-15 HISTORY — PX: CYSTOSCOPY WITH DIRECT VISION INTERNAL URETHROTOMY: SHX6637

## 2022-05-15 SURGERY — CYSTOSCOPY, WITH DIRECT VISION INTERNAL URETHROTOMY
Anesthesia: General | Site: Bladder

## 2022-05-15 MED ORDER — CHLORHEXIDINE GLUCONATE 0.12 % MT SOLN
OROMUCOSAL | Status: AC
  Filled 2022-05-15: qty 15

## 2022-05-15 MED ORDER — CIPROFLOXACIN HCL 500 MG PO TABS: 500.0000 mg | ORAL_TABLET | Freq: Two times a day (BID) | ORAL | 0 refills | Status: DC

## 2022-05-15 MED ORDER — GLYCOPYRROLATE 0.2 MG/ML IJ SOLN
INTRAMUSCULAR | Status: DC | PRN
  Administered 2022-05-15: .2 mg via INTRAVENOUS

## 2022-05-15 MED ORDER — PROPOFOL 10 MG/ML IV BOLUS
INTRAVENOUS | Status: AC
  Filled 2022-05-15: qty 40

## 2022-05-15 MED ORDER — FENTANYL CITRATE (PF) 100 MCG/2ML IJ SOLN
INTRAMUSCULAR | Status: AC
  Filled 2022-05-15: qty 2

## 2022-05-15 MED ORDER — CHLORHEXIDINE GLUCONATE 0.12 % MT SOLN
15.0000 mL | Freq: Once | OROMUCOSAL | Status: AC
  Administered 2022-05-15: 15 mL via OROMUCOSAL

## 2022-05-15 MED ORDER — PROPOFOL 10 MG/ML IV BOLUS
INTRAVENOUS | Status: DC | PRN
  Administered 2022-05-15: 160 mg via INTRAVENOUS

## 2022-05-15 MED ORDER — FENTANYL CITRATE (PF) 100 MCG/2ML IJ SOLN: 25.0000 ug | INTRAMUSCULAR | Status: DC | PRN

## 2022-05-15 MED ORDER — WATER FOR IRRIGATION, STERILE IR SOLN
Status: DC | PRN
  Administered 2022-05-15: 500 mL

## 2022-05-15 MED ORDER — ORAL CARE MOUTH RINSE: 15.0000 mL | Freq: Once | OROMUCOSAL | Status: AC

## 2022-05-15 MED ORDER — LACTATED RINGERS IV SOLN: INTRAVENOUS | Status: DC

## 2022-05-15 MED ORDER — MIDAZOLAM HCL 2 MG/2ML IJ SOLN
INTRAMUSCULAR | Status: DC | PRN
  Administered 2022-05-15 (×2): 1 mg via INTRAVENOUS

## 2022-05-15 MED ORDER — OXYCODONE HCL 5 MG PO TABS: 5.0000 mg | ORAL_TABLET | Freq: Once | ORAL | Status: DC | PRN

## 2022-05-15 MED ORDER — FAMOTIDINE 20 MG PO TABS
20.0000 mg | ORAL_TABLET | Freq: Once | ORAL | Status: AC
  Administered 2022-05-15: 20 mg via ORAL

## 2022-05-15 MED ORDER — GENTAMICIN IN SALINE 1.6-0.9 MG/ML-% IV SOLN
80.0000 mg | Freq: Once | INTRAVENOUS | Status: AC
  Administered 2022-05-15: 80 mg via INTRAVENOUS
  Filled 2022-05-15: qty 50

## 2022-05-15 MED ORDER — GENTAMICIN SULFATE 40 MG/ML IJ SOLN
80.0000 mg | Freq: Once | INTRAVENOUS | Status: DC
  Filled 2022-05-15: qty 2

## 2022-05-15 MED ORDER — FAMOTIDINE 20 MG PO TABS
ORAL_TABLET | ORAL | Status: AC
  Filled 2022-05-15: qty 1

## 2022-05-15 MED ORDER — ONDANSETRON HCL 4 MG/2ML IJ SOLN
INTRAMUSCULAR | Status: DC | PRN
  Administered 2022-05-15: 4 mg via INTRAVENOUS

## 2022-05-15 MED ORDER — FENTANYL CITRATE (PF) 100 MCG/2ML IJ SOLN
INTRAMUSCULAR | Status: DC | PRN
  Administered 2022-05-15 (×2): 50 ug via INTRAVENOUS

## 2022-05-15 MED ORDER — ACETAMINOPHEN 10 MG/ML IV SOLN: 1000.0000 mg | Freq: Once | INTRAVENOUS | Status: DC | PRN

## 2022-05-15 MED ORDER — PHENYLEPHRINE HCL (PRESSORS) 10 MG/ML IV SOLN
INTRAVENOUS | Status: DC | PRN
  Administered 2022-05-15 (×5): 80 ug via INTRAVENOUS

## 2022-05-15 MED ORDER — ONDANSETRON HCL 4 MG/2ML IJ SOLN: 4.0000 mg | Freq: Once | INTRAMUSCULAR | Status: DC | PRN

## 2022-05-15 MED ORDER — MIDAZOLAM HCL 2 MG/2ML IJ SOLN
INTRAMUSCULAR | Status: AC
  Filled 2022-05-15: qty 2

## 2022-05-15 MED ORDER — LIDOCAINE HCL (CARDIAC) PF 100 MG/5ML IV SOSY
PREFILLED_SYRINGE | INTRAVENOUS | Status: DC | PRN
  Administered 2022-05-15: 100 mg via INTRAVENOUS

## 2022-05-15 MED ORDER — OXYCODONE HCL 5 MG/5ML PO SOLN: 5.0000 mg | Freq: Once | ORAL | Status: DC | PRN

## 2022-05-15 SURGICAL SUPPLY — 21 items
BAG DRAIN SIEMENS DORNER NS (MISCELLANEOUS) ×1 IMPLANT
BAG DRN NS LF (MISCELLANEOUS) ×1
BAG DRN RND TRDRP ANRFLXCHMBR (UROLOGICAL SUPPLIES) ×1
BAG URINE DRAIN 2000ML AR STRL (UROLOGICAL SUPPLIES) ×1 IMPLANT
CATH FOLEY 2W COUNCIL 20FR 5CC (CATHETERS) IMPLANT
CATH FOLEY 2WAY  5CC 20FR SIL (CATHETERS) ×1
CATH FOLEY 2WAY 5CC 20FR SIL (CATHETERS) ×1 IMPLANT
GLOVE BIO SURGEON STRL SZ7 (GLOVE) ×2 IMPLANT
GLOVE BIO SURGEON STRL SZ7.5 (GLOVE) ×1 IMPLANT
GLOVE BIOGEL PI IND STRL 7.0 (GLOVE) ×1 IMPLANT
GOWN STRL REUS W/ TWL LRG LVL3 (GOWN DISPOSABLE) ×1 IMPLANT
GOWN STRL REUS W/ TWL XL LVL3 (GOWN DISPOSABLE) ×1 IMPLANT
GOWN STRL REUS W/TWL LRG LVL3 (GOWN DISPOSABLE) ×1
GOWN STRL REUS W/TWL XL LVL3 (GOWN DISPOSABLE) ×1
KIT TURNOVER CYSTO (KITS) ×1 IMPLANT
PACK CYSTO AR (MISCELLANEOUS) ×1 IMPLANT
SET CYSTO W/LG BORE CLAMP LF (SET/KITS/TRAYS/PACK) ×1 IMPLANT
TRAP FLUID SMOKE EVACUATOR (MISCELLANEOUS) ×1 IMPLANT
WATER STERILE IRR 3000ML UROMA (IV SOLUTION) ×1 IMPLANT
WATER STERILE IRR 500ML POUR (IV SOLUTION) ×1 IMPLANT
WIRE G XSTIFF 025X145 (WIRE) ×1 IMPLANT

## 2022-05-15 NOTE — Op Note (Signed)
Preoperative diagnosis: Urethral stricture (N35.011)  Postoperative diagnosis: Same   Procedure: Dilatation of urethral stricture with Optilume system (CPT [phone])  Surgeon: [name]. Yves Dill MD  Anesthesia: General  Indications:See the history and physical also. 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  He has had difficulty performing self-catheterization with a 16-French Foley recently and was found to have a urethral stricture.  In the past, he has required dilatation of the stricture in the office monthly, but did quite well with an Optilume procedure in March of this year.  He comes in now for Optilume procedure. After informed consent the above procedure(s) were requested     Technique and findings: After adequate general anesthesia was obtained the patient was placed into dorsal lithotomy position and the perineum was prepped and draped in the usual fashion.  A 21 French cystoscope was coupled with a camera and then advanced into the urethra.  A stricture was encountered at the still edge of the bulbar urethra.  The scope could not be passed beyond the stricture.  A .035 guidewire was then passed through the stricture and curled into the bladder using fluoroscopic guidance.  The 5 cm. Optilume catheter was passed over the guidewire and positioned in the area of the stricture.  Stricture was then dilated for 5 minutes by inflating the balloon.  At this point the balloon was deflated and the balloon catheter removed taking care to leave the guidewire in position.  An 75 French [place] catheter was passed over the guidewire and the guidewire removed.  The procedure was then terminated.  Blood loss was minimal.  The patient was then transferred to the recovery room in stable condition.

## 2022-05-15 NOTE — Transfer of Care (Signed)
Immediate Anesthesia Transfer of Care Note  Patient: [name]  Procedure(s) Performed: CYSTOSCOPY WITH DIRECT VISION INTERNAL URETHROTOMY  OPTILUME (Bladder)  Patient Location: PACU  Anesthesia Type:General  Level of Consciousness: drowsy  Airway & Oxygen Therapy: Patient Spontanous Breathing and Patient connected to face mask oxygen  Post-op Assessment: Report given to RN  Post vital signs: stable  Last Vitals:  Vitals Value Taken Time  BP    Temp    Pulse    Resp    SpO2      Last Pain:  Vitals:   05/15/22 0622  TempSrc: Oral  PainSc: 8          Complications: No notable events documented.

## 2022-05-15 NOTE — Anesthesia Postprocedure Evaluation (Signed)
Anesthesia Post Note  Patient: SMOKEY MELOTT  Procedure(s) Performed: CYSTOSCOPY WITH DIRECT VISION INTERNAL URETHROTOMY  OPTILUME (Bladder)  Patient location during evaluation: PACU Anesthesia Type: General Level of consciousness: awake and alert Pain management: pain level controlled Vital Signs Assessment: post-procedure vital signs reviewed and stable Respiratory status: spontaneous breathing, nonlabored ventilation, respiratory function stable and patient connected to nasal cannula oxygen Cardiovascular status: blood pressure returned to baseline and stable Postop Assessment: no apparent nausea or vomiting Anesthetic complications: no   No notable events documented.   Last Vitals:  Vitals:   05/15/22 0810 05/15/22 0817  BP: 126/66 99/64  Pulse: 69 70  Resp: 10 12  Temp: (!) 36.1 C   SpO2: 100% 100%    Last Pain:  Vitals:   05/15/22 0810  TempSrc:   PainSc: Asleep                 Arita Miss

## 2022-05-15 NOTE — Anesthesia Preprocedure Evaluation (Addendum)
Anesthesia Evaluation  Patient identified by MRN, date of birth, ID band Patient awake    Reviewed: Allergy & Precautions, NPO status , Patient's Chart, lab work & pertinent test results  History of Anesthesia Complications Negative for: history of anesthetic complications  Airway Mallampati: III  TM Distance: >3 FB Neck ROM: Full    Dental  (+) Partial Upper, Partial Lower   Pulmonary neg pulmonary ROS, neg sleep apnea, neg COPD, Patient abstained from smoking.Not current smoker,    Pulmonary exam normal breath sounds clear to auscultation       Cardiovascular Exercise Tolerance: Poor METShypertension, Pt. on medications (-) CAD, (-) Past MI and (-) CHF (-) dysrhythmias (-) Valvular Problems/Murmurs Rhythm:Regular Rate:Normal - Systolic murmurs    Neuro/Psych neg Seizures L2 parapelegic  Neuromuscular disease negative psych ROS   GI/Hepatic Neg liver ROS, neg GERD  ,  Endo/Other  neg diabetes  Renal/GU negative Renal ROS     Musculoskeletal   Abdominal   Peds  Hematology   Anesthesia Other Findings Past Medical History: No date: AVM (arteriovenous malformation) spine No date: Chronic pain disorder     Comment:  works with the [facility] for intrathecal              pump meds and stimulator 2022: Decubitus ulcer of buttock     Comment:  recently had skin flap surgery to use his own skin to               cover pressure sore.  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 surgery No date: Prostate cancer ([place])     Comment:   taking Casodex (hormone chemotherapy) No date: Status post insertion of intrathecal  baclofen pump No date: Status post insertion of spinal cord stimulator  Reproductive/Obstetrics                             Anesthesia Physical  Anesthesia Plan  ASA: 3  Anesthesia Plan: General   Post-op Pain Management: Ofirmev IV (intra-op)*   Induction: Intravenous  PONV Risk Score and Plan: 2 and Dexamethasone and Ondansetron  Airway Management Planned: LMA  Additional Equipment: None  Intra-op Plan:   Post-operative Plan: Extubation in OR  Informed Consent: I have reviewed the patients History and Physical, chart, labs and discussed the procedure including the risks, benefits and alternatives for the proposed anesthesia with the patient or authorized representative who has indicated his/her understanding and acceptance.     Dental advisory given  Plan Discussed with: CRNA and Surgeon  Anesthesia Plan Comments: (Discussed risks of anesthesia with patient, including PONV, sore throat, lip/dental damage. Rare risks discussed as well, such as cardiorespiratory and neurological sequelae. Patient understands. Pain pump off preprocedure.)       Anesthesia Quick Evaluation

## 2022-05-15 NOTE — Discharge Instructions (Addendum)
Urethrotomy, Care After This sheet gives you information about how to care for yourself after your procedure. Your health care provider may also give you more specific instructions. If you have problems or questions, contact your health care provider. What can I expect after the procedure? After the procedure, it is common to have: Burning pain when urinating. Pain or discomfort in your genital area. A small amount of blood in your urine. Your health care provider will tell you how long you can expect to have blood in your urine. Bloody urine leaking from around your catheter. Follow these instructions at home: Catheter and drainage bag  Follow instructions from your health care provider about how to care for your catheter and your drainage bag. Do not take baths, swim, or use a hot tub until your catheter has been removed. You may take showers while your catheter is in place. If you have to insert a catheter on your own (self-catheterization) after your catheter is removed, make sure you understand the procedure completely. Carefully follow instructions from your health care provider. Medicines Take over-the-counter and prescription medicines only as told by your health care provider. If you were prescribed an antibiotic medicine, take it as told by your health care provider. Do not stop taking the antibiotic even if you start to feel better. Ask your health care provider if the medicine prescribed to you: Requires you to avoid driving or using heavy machinery. Can cause constipation. You may need to take these actions to prevent or treat constipation: Take over-the-counter or prescription medicines. Eat foods that are high in fiber, such as beans, whole grains, and fresh fruits and vegetables. Limit foods that are high in fat and processed sugars, such as fried or sweet foods. Activity  Do not drive for 24 hours if you were given a sedative during your procedure. Take short walks several  times a day during your recovery. Do not lift anything that is heavier than 10 lb (4.5 kg), or the limit that you are told, until your health care provider says that it is safe. Return to your normal activities as told by your health care provider. Ask your health care provider what activities are safe for you. Do not have sex until your health care provider says it is okay. General instructions Drink enough fluid to keep your urine pale yellow. If a bandage (dressing) was applied over the opening of your urethra, change the dressing as told by your health care provider. Make sure you: Wash your hands with soap and water before and after you change your dressing. If soap and water are not available, use hand sanitizer. Keep your dressing clean and dry. Do not use any products that contain nicotine or tobacco, such as cigarettes, e-cigarettes, and chewing tobacco. These can delay healing after surgery. If you need help quitting, ask your health care provider. Keep all follow-up visits as told by your health care provider. This is important. Contact a health care provider if you: Have a fever or chills. Have pain that gets worse or does not get better with medicine. Have blood in your urine for longer than your health care provider told you to expect. Are a male and have any of these problems: Trouble getting an erection. Pain when you have an erection. Blood in your semen. Have any of these problems after your catheter is removed: Trouble urinating. A slow urine stream. Urinating less than usual. Several streams or "spray" when you urinate. Have pain in your abdomen.  Have swelling in your genital area that does not go away. Get help right away if: You have severe pain. A lot of blood is leaking from around your catheter. You have blood clots in your urine. Your catheter stops draining urine. You cannot urinate after your catheter is removed. You have redness, warmth, or pain in your  leg. You have chest pain. You have trouble breathing. These symptoms may represent a serious problem that is an emergency. Do not wait to see if the symptoms will go away. Get medical help right away. Call your local emergency services (911 in the [place] Do not drive yourself to the hospital. Summary After the procedure, it is common to have burning pain when urinating and a small amount of blood in your urine. Follow instructions from your health care provider about how to care for your catheter and your drainage bag. Take short walks several times a day during your recovery. If a bandage (dressing) was applied over the opening of your urethra, change the dressing as told by your health care provider. Contact your health care provider if you have trouble urinating after your catheter is removed. This information is not intended to replace advice given to you by your health care provider. Make sure you discuss any questions you have with your health care provider. Document Revised: 01/10/2019 Document Reviewed: 01/10/2019 Elsevier Patient Education  [place], Adult An indwelling urinary catheter is a thin tube that is put into your bladder. The tube helps to drain pee (urine) out of your body. The tube goes in through your urethra. Your urethra is where pee comes out of your body. Your pee will come out through the catheter, then it will go into a bag (drainage bag). Take good care of your catheter so it will work well. What are the risks? Germs may get into your bladder and cause an infection. The tube can become blocked. Tissue near the catheter may become irritated and may bleed. How to wear your catheter and drainage bag Supplies needed Sticky tape (adhesive tape) or a leg strap. Alcohol wipe or soap and water (if you use tape). A clean towel (if you use tape). Large overnight bag. Smaller bag (leg bag). Wearing your catheter Attach your  catheter to your leg with tape or a leg strap. Make sure the catheter is not pulled tight. If a leg strap gets wet, take it off and put on a dry strap. If you use tape to hold the bag on your leg: Use an alcohol wipe or soap and water to wash your skin where the tape made it sticky before. Use a clean towel to pat-dry that skin. Use new tape to make the bag stay on your leg. Wearing your bags You should have been given a large overnight bag. You may wear the overnight bag in the day or night. Always have the overnight bag lower than your bladder.  Do not let the bag touch the floor. Before you go to sleep, put a clean plastic bag in a wastebasket. Then, hang the overnight bag inside the wastebasket. You should also have a smaller leg bag that fits under your clothes. Wear the leg bag as told by the product maker. This may be above or below the knee, depending on the length of the tubing. Make sure that the leg bag is below the bladder. Make sure that the tubing does not have loops or too much tension. Do not  wear your leg bag at night. How to care for your skin and catheter Supplies needed A clean washcloth. Water and mild soap. A clean towel. Caring for your skin and catheter     Clean the skin around your catheter every day. Wash your hands with soap and water. Wet a clean washcloth in warm water and mild soap. Clean the skin around your urethra. If you are male: Gently spread the folds of skin around your vagina (labia). With the washcloth in your other hand, wipe the inner side of your labia on each side. Wipe from front to back. If you are male: Pull back any skin that covers the end of your penis (foreskin). With the washcloth in your other hand, wipe your penis in small circles. Start wiping at the tip of your penis, then move away from the catheter. Move the foreskin back in place, if needed. With your free hand, hold the catheter close to where it goes into your  body. Keep holding the catheter during cleaning so it does not get pulled out. With the washcloth in your other hand, clean the catheter. Only wipe downward on the catheter, toward the drainage bag. Do not wipe upward toward your body. Doing this may push germs into your urethra and cause infection. Use a clean towel to pat-dry the catheter and the skin around it. Make sure to wipe off all soap. Wash your hands with soap and water. Shower every day. Do not take baths. Do not use cream, ointment, or lotion on the area where the catheter goes into your body, unless your doctor tells you to. Do not use powders, sprays, or lotions on your genital area. Check your skin around the catheter every day for signs of infection. Check for: Redness, swelling, or pain. Fluid or blood. Warmth. Pus or a bad smell. How to empty the bag Supplies needed Rubbing alcohol. Gauze pad or cotton ball. Tape or a leg strap. Emptying the bag Pour the pee out of your bag when it is ?- full, or at least 2-3 times a day. Do this for your overnight bag and your leg bag. Wash your hands with soap and water. Separate (detach) the bag from your leg. Hold the bag over the toilet or a clean pail. Keep the bag lower than your hips and bladder. This is so the pee (urine) does not go back into the tube. Open the pour spout. It is at the bottom of the bag. Empty the pee into the toilet or pail. Do not let the pour spout touch any surface. Put rubbing alcohol on a gauze pad or cotton ball. Use the gauze pad or cotton ball to clean the pour spout. Close the pour spout. Attach the bag to your leg with tape or a leg strap. Wash your hands with soap and water. Follow instructions for cleaning the drainage bag. Instructions can come from: The product maker. Your doctor. How to change the bag Changing the bag Replace your bag when it starts to leak, smell bad, or look dirty. Wash your hands with soap and water. Separate the  dirty bag from your leg. Pinch the catheter with your fingers so that pee does not spill out. Separate the catheter tube from the bag tube where these tubes connect (at the connection valve). Do not let the tubes touch any surface. Clean the end of the catheter tube with an alcohol wipe. Use a different alcohol wipe to clean the end of the bag tube.  Connect the catheter tube to the tube of the clean bag. Attach the clean bag to your leg with tape or a leg strap. Do not make the bag tight on your leg. Wash your hands with soap and water. General instructions  Never pull on your catheter. Never try to take it out. Doing that can hurt you. Always wash your hands before and after you touch your catheter or bag. Use a mild, fragrance-free soap. If you do not have soap and water, use hand sanitizer. Always make sure there are no twists, bends, or kinks in the catheter tube. Always make sure there are no leaks in the catheter or bag. Drink enough fluid to keep your pee pale yellow. Do not take baths, swim, or use a hot tub. If you are male, wipe from front to back after you poop (have a bowel movement). Contact a doctor if: Your catheter gets clogged. Your catheter leaks. You have signs of infection at the catheter site, such as: Redness, swelling, or pain where the catheter goes into your body. Fluid, blood, pus, or a bad smell coming from the area where the catheter goes into your body. Skin feels warm where the catheter goes into your body. You have signs of a bladder infection, such as: Fever. Chills. Pee smells worse than usual. Cloudy pee. Pain in your belly, legs, lower back, or bladder. Vomiting or feel like vomiting. Get help right away if: You see blood in the catheter. Your pee is pink or red. Your bladder feels full. Your pee is not draining into the bag. Your catheter gets pulled out. Summary An indwelling urinary catheter is a thin tube that is placed into the bladder to  help drain pee (urine) out of the body. The catheter is placed into the part of the body that drains pee from the bladder (urethra). Taking good care of your catheter will keep it working well. Always wash your hands before and after touching your catheter or bag. Never pull on your catheter or try to take it out. This information is not intended to replace advice given to you by your health care provider. Make sure you discuss any questions you have with your health care provider. Document Revised: 03/07/2021 Document Reviewed: 03/07/2021 Elsevier Patient Education  [place]   The drugs that you were given will stay in your system until tomorrow so for the next 24 hours you should not:  Drive an automobile Make any legal decisions Drink any alcoholic beverage   You may resume regular meals tomorrow.  Today it is better to start with liquids and gradually work up to solid foods.  You may eat anything you prefer, but it is better to start with liquids, then soup and crackers, and gradually work up to solid foods.   Please notify your doctor immediately if you have any unusual bleeding, trouble breathing, redness and pain at the surgery site, drainage, fever, or pain not relieved by medication.    Additional Instructions:        Please contact your physician with any problems or Same Day Surgery at [phone], Monday through Friday 6 am to 4 pm, or [facility] number at [phone].

## 2022-05-15 NOTE — H&P (Signed)
Date of Initial H&P: 05/08/22  History reviewed, patient examined, no change in status, stable for surgery.

## 2022-05-15 NOTE — Anesthesia Procedure Notes (Signed)
Procedure Name: LMA Insertion Date/Time: 05/15/2022 7:32 AM  Performed by: Lerry Liner, CRNAPre-anesthesia Checklist: Patient identified, Emergency Drugs available, Suction available and Patient being monitored Patient Re-evaluated:Patient Re-evaluated prior to induction Oxygen Delivery Method: Circle system utilized Preoxygenation: Pre-oxygenation with 100% oxygen Induction Type: IV induction Ventilation: Mask ventilation without difficulty LMA: LMA inserted LMA Size: 5.0 Tube type: Oral Number of attempts: 1 Placement Confirmation: positive ETCO2 and breath sounds checked- equal and bilateral Tube secured with: Tape Dental Injury: Teeth and Oropharynx as per pre-operative assessment

## 2022-05-16 ENCOUNTER — Encounter: Payer: Self-pay | Admitting: Urology

## 2024-01-20 ENCOUNTER — Other Ambulatory Visit: Payer: Self-pay

## 2024-01-20 ENCOUNTER — Inpatient Hospital Stay
Admission: EM | Admit: 2024-01-20 | Discharge: 2024-01-27 | DRG: 563 | Disposition: A | Discharge status: Skilled Nursing Facility | Attending: Osteopathic Medicine | Admitting: Osteopathic Medicine

## 2024-01-20 ENCOUNTER — Emergency Department

## 2024-01-20 DIAGNOSIS — M21372 Foot drop, left foot: Secondary | ICD-10-CM | POA: Diagnosis present

## 2024-01-20 DIAGNOSIS — I493 Ventricular premature depolarization: Secondary | ICD-10-CM | POA: Diagnosis present

## 2024-01-20 DIAGNOSIS — E663 Overweight: Secondary | ICD-10-CM | POA: Diagnosis present

## 2024-01-20 DIAGNOSIS — S82141A Displaced bicondylar fracture of right tibia, initial encounter for closed fracture: Secondary | ICD-10-CM | POA: Diagnosis not present

## 2024-01-20 DIAGNOSIS — W19XXXA Unspecified fall, initial encounter: Principal | ICD-10-CM | POA: Diagnosis present

## 2024-01-20 DIAGNOSIS — Z87440 Personal history of urinary (tract) infections: Secondary | ICD-10-CM

## 2024-01-20 DIAGNOSIS — S82201A Unspecified fracture of shaft of right tibia, initial encounter for closed fracture: Secondary | ICD-10-CM | POA: Diagnosis present

## 2024-01-20 DIAGNOSIS — Z880 Allergy status to penicillin: Secondary | ICD-10-CM

## 2024-01-20 DIAGNOSIS — Z87442 Personal history of urinary calculi: Secondary | ICD-10-CM

## 2024-01-20 DIAGNOSIS — Z8546 Personal history of malignant neoplasm of prostate: Secondary | ICD-10-CM

## 2024-01-20 DIAGNOSIS — G894 Chronic pain syndrome: Secondary | ICD-10-CM | POA: Diagnosis present

## 2024-01-20 DIAGNOSIS — Z79899 Other long term (current) drug therapy: Secondary | ICD-10-CM

## 2024-01-20 DIAGNOSIS — I10 Essential (primary) hypertension: Secondary | ICD-10-CM | POA: Diagnosis present

## 2024-01-20 DIAGNOSIS — S82831A Other fracture of upper and lower end of right fibula, initial encounter for closed fracture: Secondary | ICD-10-CM | POA: Diagnosis present

## 2024-01-20 DIAGNOSIS — G8220 Paraplegia, unspecified: Secondary | ICD-10-CM | POA: Diagnosis present

## 2024-01-20 DIAGNOSIS — K592 Neurogenic bowel, not elsewhere classified: Secondary | ICD-10-CM | POA: Diagnosis present

## 2024-01-20 DIAGNOSIS — M25561 Pain in right knee: Secondary | ICD-10-CM

## 2024-01-20 DIAGNOSIS — Z9181 History of falling: Secondary | ICD-10-CM

## 2024-01-20 DIAGNOSIS — Y92009 Unspecified place in unspecified non-institutional (private) residence as the place of occurrence of the external cause: Secondary | ICD-10-CM

## 2024-01-20 DIAGNOSIS — Z993 Dependence on wheelchair: Secondary | ICD-10-CM

## 2024-01-20 DIAGNOSIS — N390 Urinary tract infection, site not specified: Secondary | ICD-10-CM | POA: Diagnosis present

## 2024-01-20 DIAGNOSIS — Z87891 Personal history of nicotine dependence: Secondary | ICD-10-CM

## 2024-01-20 DIAGNOSIS — K5909 Other constipation: Secondary | ICD-10-CM | POA: Diagnosis present

## 2024-01-20 DIAGNOSIS — Z881 Allergy status to other antibiotic agents status: Secondary | ICD-10-CM

## 2024-01-20 DIAGNOSIS — S82101A Unspecified fracture of upper end of right tibia, initial encounter for closed fracture: Secondary | ICD-10-CM

## 2024-01-20 DIAGNOSIS — Z882 Allergy status to sulfonamides status: Secondary | ICD-10-CM

## 2024-01-20 DIAGNOSIS — N319 Neuromuscular dysfunction of bladder, unspecified: Secondary | ICD-10-CM | POA: Diagnosis present

## 2024-01-20 DIAGNOSIS — I700 Atherosclerosis of aorta: Secondary | ICD-10-CM | POA: Diagnosis present

## 2024-01-20 DIAGNOSIS — S34109A Unspecified injury to unspecified level of lumbar spinal cord, initial encounter: Secondary | ICD-10-CM | POA: Diagnosis present

## 2024-01-20 DIAGNOSIS — Z6827 Body mass index (BMI) 27.0-27.9, adult: Secondary | ICD-10-CM

## 2024-01-20 DIAGNOSIS — S34109S Unspecified injury to unspecified level of lumbar spinal cord, sequela: Secondary | ICD-10-CM

## 2024-01-20 DIAGNOSIS — M25461 Effusion, right knee: Secondary | ICD-10-CM | POA: Diagnosis present

## 2024-01-20 MED ORDER — MORPHINE SULFATE (PF) 2 MG/ML IV SOLN
2.0000 mg | Freq: Once | INTRAVENOUS | Status: AC
  Administered 2024-01-20: 2 mg via INTRAVENOUS
  Filled 2024-01-20: qty 1

## 2024-01-20 NOTE — ED Provider Notes (Incomplete)
 [name] Provider Note    Event Date/Time   First MD Initiated Contact with Patient 01/20/24 2306     (approximate)   History   Fall   HPI  [name] is a 73 y.o. male with history of hypertension, chronic pain disorder, neuromuscular disorder, chronic left dropfoot, presenting with right knee pain after fall.  Patient states that he was trying to transfer into his wheelchair, the wheels were not locked, he fell and landed on his right knee.  States that he heard a pop, unsure if he felt it dislocate.  Did not hit his head.  He denies being on any blood thinning medication.  He denies any new weakness or numbness.  States that he did not feel lightheaded or have any chest pain or shortness of breath or other infectious symptoms prior to the fall.  Per independent straight from EMS, they noted frequent PVCs on his EKG, possible beats of bigeminy.  Patient denies prior history of bigeminy or PVCs.  Has not seen a cardiologist previously.  Independent history obtained from EMS as above, they also gave him 100 mcg of fentanyl  for pain.  On independent chart review, he was seen by his primary care doctor in April, does have history of recurrent UTIs, history of difficulty walking and is wheelchair-bound, does have history of prostate cancer.  He denies any urinary symptoms, states that he was in his usual state of health prior to the fall.     Physical Exam   Triage Vital Signs: ED Triage Vitals  Encounter Vitals Group     BP      Girls Systolic BP Percentile      Girls Diastolic BP Percentile      Boys Systolic BP Percentile      Boys Diastolic BP Percentile      Pulse      Resp      Temp      Temp src      SpO2      Weight      Height      Head Circumference      Peak Flow      Pain Score      Pain Loc      Pain Education      Exclude from Growth Chart     Most recent vital signs: Vitals:   01/20/24 2306  BP: (!) 153/88  Pulse:  91  Resp: (!) 9  Temp: 98.3 F (36.8 C)  SpO2: 98%     General: Awake, no distress.  CV:  Good peripheral perfusion.  Resp:  Normal effort.  No thoracic cage tenderness Abd:  No distention.  Soft nontender Other:  Pupils are equal and reactive, extraocular movements are intact, no new focal weakness or numbness, he does have palpable DP and PT pulses bilaterally, palpable radial pulses bilaterally, equal grip strength, he does have decreased strength with plantarflexion on his left that is chronic.  Full range of motion of bilateral upper extremities as well as left lower extremities intact, range of motion of right ankle and right hip is intact, he does have some reduced range of motion to the right knee due to swelling, there is no overlying erythema or ecchymoses, there is knee swelling on the right, it is tender anteriorly.  His right lower extremity compartments are soft. no midline spinal tenderness   ED Results / Procedures / Treatments   Labs (all labs ordered are  listed, but only abnormal results are displayed) Labs Reviewed  COMPREHENSIVE METABOLIC PANEL WITH GFR - Abnormal; Notable for the following components:      Result Value   Glucose, Bld 114 (*)    All other components within normal limits  CBC WITH DIFFERENTIAL/PLATELET  TROPONIN I (HIGH SENSITIVITY)  TROPONIN I (HIGH SENSITIVITY)     EKG  EKG shows, sinus rhythm, multiple PVCs noted, rate 93, 1 QRS, normal QTc, right bundle branch block, no obvious ischemic ST elevation, T wave flattening in 3, not significant change compared to prior   RADIOLOGY On my independent interpretation, the x-ray shows tibial fracture   PROCEDURES:  Critical Care performed: No  Procedures   MEDICATIONS ORDERED IN ED: Medications  oxyCODONE  (Oxy IR/ROXICODONE ) immediate release tablet 5 mg (has no administration in time range)  acetaminophen  (TYLENOL ) tablet 650 mg (has no administration in time range)  morphine  (PF) 2  MG/ML injection 2 mg (2 mg Intravenous Given 01/20/24 2348)  iohexol  (OMNIPAQUE ) 350 MG/ML injection 125 mL (125 mLs Intravenous Contrast Given 01/21/24 0042)     IMPRESSION / MDM / ASSESSMENT AND PLAN / ED COURSE  I reviewed the triage vital signs and the nursing notes.                              Differential diagnosis includes, but is not limited to, knee hematoma, fracture, ligamentous injury, strain, sprain, patient states that he felt a pop, had some pain, unsure if there was any dislocation, he has palpable pulses, will get an x-ray of the knee, CT angio to assess for any vascular injury, will give him some IV morphine .  For the PVCs as well as possible bigeminy, patient denies prior history, he denies any chest pain or shortness of breath.  He states that no other symptoms prior to the fall.  Will get an EKG, will place him on cardiac monitor here, will get labs and troponin.  Discussed with patient that he might need outpatient follow-up with cardiology for further assessment of his frequent PVCs.  Will reassess  Patient's presentation is most consistent with acute presentation with potential threat to life or bodily function.  Independent interpretation of labs and imaging below.  Discussed with patient and family about imaging and lab results including incidental findings, he was monitored here, has PVCs on the monitor but no persistent bigeminy.  He is asymptomatic at this time other than the knee pain.  Discussed with him about following up with orthopedic surgery outpatient in a week for further management of his fracture.  Will also put in a cardiology referral for the frequent PVCs noted on EMS EKG strip as well as possible bigeminy.  Discussed with patient and family about home health, they have someone coming out for PT, patient and family at first voiced comfort with going home but after some consideration they are now concerned that he might not have enough help at home to assist with  his movement since he is not very mobile.  Will keep him as a border and have social work and physical therapy evaluate him.  He is agreeable to go to rehab if that is the recommendation.  The patient is on the cardiac monitor to evaluate for evidence of arrhythmia and/or significant heart rate changes.   Clinical Course as of 01/21/24 0225  Thu Jan 21, 2024  0006 Orthopedic surgery took a look at his images, consulted, recommended CT  imaging to better characterize the tibial plateau fracture.  States that if he is not ambulatory and wheelchair-bound, can be treated nonsurgically with knee immobilizer and follow-up with orthopedic surgery as an outpatient next week. [TT]  0050 DG Knee Complete 4 Views Right IMPRESSION: Acute mildly displaced oblique fracture in the proximal right tibial metaphysis. Question extension of the fracture into the lateral tibial plateau. Moderate knee joint effusion.   [TT]  0141 CT ANGIO LOWER EXT BILAT W &/OR WO CONTRAST IMPRESSION: 1. No evidence of acute vascular injury. 2. Redemonstrated oblique fracture in the proximal right tibial metadiaphysis. Given large lipohemarthrosis this likely extends into the articular surface of the lateral tibial plateau however the exam is not optimized for fracture evaluation. 3. Additional nondisplaced fracture of the right fibular head. 4. Aortic Atherosclerosis (ICD10-I70.0).  [TT]    Clinical Course User Index [TT] Waymond Lorelle Cummins, MD     FINAL CLINICAL IMPRESSION(S) / ED DIAGNOSES   Final diagnoses:  Fall, initial encounter  PVC's (premature ventricular contractions)  Acute pain of right knee  Closed fracture of right tibial plateau, initial encounter  Closed fracture of proximal end of right tibia, unspecified fracture morphology, initial encounter  Closed fracture of proximal end of right fibula, unspecified fracture morphology, initial encounter     Rx / DC Orders   ED Discharge Orders           Ordered    oxyCODONE  (ROXICODONE ) 5 MG immediate release tablet  Every 8 hours PRN        01/21/24 0144    Ambulatory referral to Cardiology       Comments: If you have not heard from the Cardiology office within the next 72 hours please call [phone].   01/21/24 0145             Note:  This document was prepared using Dragon voice recognition software and may include unintentional dictation errors.    Waymond Lorelle Cummins, MD 01/21/24 WILLARD    Waymond Lorelle Cummins, MD 01/21/24 RAYMONDE    Waymond Lorelle Cummins, MD 01/21/24 [phone]

## 2024-01-20 NOTE — ED Triage Notes (Signed)
 Patient arrives from home by [facility] after a fall at home trying to transfer from toilet back to his wheelchair.  Patient injured his right knee.  Patient was given 100 mcg of fentanyl .  After placing on monitor, patient was found to be in bigeminy with multiple PVC's.

## 2024-01-21 ENCOUNTER — Emergency Department

## 2024-01-21 LAB — COMPREHENSIVE METABOLIC PANEL WITH GFR
ALT: 25 U/L (ref 0–44)
AST: 25 U/L (ref 15–41)
Albumin: 4 g/dL (ref 3.5–5.0)
Alkaline Phosphatase: 73 U/L (ref 38–126)
Anion gap: 9 (ref 5–15)
BUN: 23 mg/dL (ref 8–23)
CO2: 27 mmol/L (ref 22–32)
Calcium: 9.1 mg/dL (ref 8.9–10.3)
Chloride: 103 mmol/L (ref 98–111)
Creatinine, Ser: 0.76 mg/dL (ref 0.61–1.24)
GFR, Estimated: >60 mL/min (ref >60)
Glucose, Bld: 114 mg/dL — ABNORMAL HIGH (ref 70–99)
Potassium: 4.2 mmol/L (ref 3.5–5.1)
Sodium: 139 mmol/L (ref 135–145)
Total Bilirubin: 0.3 mg/dL (ref 0.0–1.2)
Total Protein: 7 g/dL (ref 6.5–8.1)

## 2024-01-21 LAB — CBC WITH DIFFERENTIAL/PLATELET
Abs Immature Granulocytes: 0.02 10*3/uL (ref 0.00–0.07)
Basophils Absolute: 0 10*3/uL (ref 0.0–0.1)
Basophils Relative: 0 %
Eosinophils Absolute: 0.2 10*3/uL (ref 0.0–0.5)
Eosinophils Relative: 3 %
HCT: 43 % (ref 39.0–52.0)
Hemoglobin: 14.5 g/dL (ref 13.0–17.0)
Immature Granulocytes: 0 %
Lymphocytes Relative: 24 %
Lymphs Abs: 1.7 10*3/uL (ref 0.7–4.0)
MCH: 29.7 pg (ref 26.0–34.0)
MCHC: 33.7 g/dL (ref 30.0–36.0)
MCV: 87.9 fL (ref 80.0–100.0)
Monocytes Absolute: 0.6 10*3/uL (ref 0.1–1.0)
Monocytes Relative: 9 %
Neutro Abs: 4.5 10*3/uL (ref 1.7–7.7)
Neutrophils Relative %: 64 %
Platelets: 180 10*3/uL (ref 150–400)
RBC: 4.89 MIL/uL (ref 4.22–5.81)
RDW: 12.3 % (ref 11.5–15.5)
WBC: 7.1 10*3/uL (ref 4.0–10.5)
nRBC: 0 % (ref 0.0–0.2)

## 2024-01-21 LAB — TROPONIN I (HIGH SENSITIVITY): Troponin I (High Sensitivity): 5 ng/L (ref <18)

## 2024-01-21 MED ORDER — GABAPENTIN 400 MG PO CAPS
800.0000 mg | ORAL_CAPSULE | Freq: Three times a day (TID) | ORAL | Status: DC
  Administered 2024-01-21 – 2024-01-27 (×19): 800 mg via ORAL
  Filled 2024-01-21 (×19): qty 2

## 2024-01-21 MED ORDER — ACETAMINOPHEN 325 MG PO TABS
650.0000 mg | ORAL_TABLET | ORAL | Status: DC | PRN
  Administered 2024-01-21 – 2024-01-23 (×2): 650 mg via ORAL
  Filled 2024-01-21 (×2): qty 2

## 2024-01-21 MED ORDER — IOHEXOL 350 MG/ML SOLN
125.0000 mL | Freq: Once | INTRAVENOUS | Status: AC | PRN
  Administered 2024-01-21: 125 mL via INTRAVENOUS

## 2024-01-21 MED ORDER — METHENAMINE MANDELATE 0.5 G PO TABS
1000.0000 mg | ORAL_TABLET | Freq: Three times a day (TID) | ORAL | Status: DC
  Administered 2024-01-21 – 2024-01-27 (×18): 1000 mg via ORAL
  Filled 2024-01-21 (×21): qty 2

## 2024-01-21 MED ORDER — OXYCODONE HCL 5 MG PO TABS
5.0000 mg | ORAL_TABLET | Freq: Four times a day (QID) | ORAL | Status: DC | PRN
  Administered 2024-01-21: 5 mg via ORAL
  Filled 2024-01-21 (×3): qty 1

## 2024-01-21 MED ORDER — OXYCODONE HCL 5 MG PO TABS: 5.0000 mg | ORAL_TABLET | Freq: Three times a day (TID) | ORAL | 0 refills | Status: DC | PRN

## 2024-01-21 NOTE — Discharge Instructions (Addendum)
 You can take 650 mg of Tylenol  every 6 hours as needed for pain, please reserve the oxycodone  for severe breakthrough pain.  Please do not drive or operate heavy machinery while on oxycodone .  Please make sure to follow-up with orthopedic surgery in a week to get reassessed and further manage for your knee fracture.  I have also put in a cardiology referral for your PVCs noted.  Please make sure to keep your legs elevated, if you have any new weakness or numbness, any skin changes or discoloration to your right leg, or if you have pain out of proportion that is not helped with oxycodone  and Tylenol  please return to the emergency department to be seen.

## 2024-01-21 NOTE — ED Notes (Signed)
 Pt self cath in room. Supplies provided. Assistance provided

## 2024-01-21 NOTE — Evaluation (Signed)
 Occupational Therapy Evaluation Patient Details Name: [name] MRN: [id number] DOB: Jan 19, 1951 Today's Date: 01/21/2024   History of Present Illness   [name] is a 73 y.o. male with history of hypertension, chronic pain disorder, neuromuscular disorder, chronic left dropfoot, presenting with right knee pain after fall.  Patient states that he was trying to transfer into his wheelchair, the wheels were not locked, he fell and landed on his right knee.  States that he heard a pop, unsure if he felt it dislocate.     Clinical Impressions Patient presenting with decreased Ind in self care,balance, functional mobility/transfers, endurance, and safety awareness. Patient reports living at home with wife and being wheelchair level at baseline for self care tasks and functional transfers. Pt reports wheelchair can get inside of bathroom door and he is able to stand pivot to commode while holding on to grab bar.  Patient currently functioning at min - mod A for bed mobility. Static sitting balance min A. R LE in KI during session and KI repositioned for comfort and skin check.  Pt needing min A for balance to doff soiled pull over shirt and don clean hospital gown. Pt does fatigue quickly and pt returns to supine with OT assistance. Call bell and all needed items within reach.  Patient will benefit from acute OT to increase overall independence in the areas of ADLs, functional mobility, and safety awareness in order to safely discharge.     If plan is discharge home, recommend the following:   A lot of help with bathing/dressing/bathroom;Assistance with cooking/housework;Two people to help with walking and/or transfers;Help with stairs or ramp for entrance;Assist for transportation     Functional Status Assessment   Patient has had a recent decline in their functional status and demonstrates the ability to make significant improvements in function in a reasonable and predictable  amount of time.     Equipment Recommendations   None recommended by OT      Precautions/Restrictions   Precautions Precautions: Fall Precaution/Restrictions Comments: KI donned. No specifc Wb status orders. No ortho consult note. Per NSG, plan for ortho follow up outpt Restrictions Other Position/Activity Restrictions: treated as NWB RLE     Mobility Bed Mobility Overal bed mobility: Needs Assistance Bed Mobility: Supine to Sit, Sit to Supine     Supine to sit: HOB elevated, Used rails, Min assist Sit to supine: Used rails, Min assist        Transfers Overall transfer level: Needs assistance                Lateral/Scoot Transfers: Max assist        Balance Overall balance assessment: Needs assistance Sitting-balance support: Bilateral upper extremity supported, Feet supported Sitting balance-Leahy Scale: Poor                                     ADL either performed or assessed with clinical judgement   ADL Overall ADL's : Needs assistance/impaired                 Upper Body Dressing : Set up;Supervision/safety Upper Body Dressing Details (indicate cue type and reason): min A for sitting balance when doffing/donning clean shirt                         Vision Patient Visual Report: No change from baseline  Pertinent Vitals/Pain Pain Assessment Pain Assessment: Faces Faces Pain Scale: Hurts little more Pain Location: R knee Pain Descriptors / Indicators: Discomfort, Guarding Pain Intervention(s): Limited activity within patient's tolerance, Monitored during session, Repositioned     Extremity/Trunk Assessment Upper Extremity Assessment Upper Extremity Assessment: Generalized weakness   Lower Extremity Assessment Lower Extremity Assessment: Generalized weakness;RLE deficits/detail RLE Deficits / Details: RLE in KI LLE Deficits / Details: chronic L foot drop       Communication  Communication Communication: No apparent difficulties   Cognition Arousal: Alert Behavior During Therapy: WFL for tasks assessed/performed Cognition: Cognition impaired   Orientation impairments: Time     Attention impairment (select first level of impairment): Sustained attention Executive functioning impairment (select all impairments): Problem solving                   Following commands: Intact       Cueing  General Comments   Cueing Techniques: Verbal cues;Gestural cues              Home Living Family/patient expects to be discharged to:: Private residence Living Arrangements: Spouse/significant other Available Help at Discharge: Family;Available 24 hours/day Type of Home: House Home Access: Ramped entrance     Home Layout: One level     Bathroom Shower/Tub: Walk-in shower;Sponge bathes at baseline         Home Equipment: Wheelchair - manual;Shower seat;Grab bars - toilet;Hand held shower head;Electric scooter;BSC/3in1          Prior Functioning/Environment Prior Level of Function : Independent/Modified Independent;Needs assist             Mobility Comments: mod-I using manual w/c. lateral scoots to and from bed, SPT  to and from toilet ADLs Comments: spouse drives, assists with IADL's    OT Problem List: Decreased strength;Decreased safety awareness;Impaired balance (sitting and/or standing);Pain;Decreased knowledge of use of DME or AE   OT Treatment/Interventions: Self-care/ADL training;Therapeutic activities;Therapeutic exercise;Energy conservation;Balance training      OT Goals(Current goals can be found in the care plan section)   Acute Rehab OT Goals Patient Stated Goal: to get stronger and go home OT Goal Formulation: With patient/family Time For Goal Achievement: 02/04/24 Potential to Achieve Goals: Fair ADL Goals Pt Will Perform Grooming: sitting;with supervision Pt Will Perform Lower Body Dressing: sitting/lateral  leans;with supervision Pt Will Transfer to Toilet: with supervision;bedside commode Pt Will Perform Toileting - Clothing Manipulation and hygiene: with supervision;sitting/lateral leans   OT Frequency:  Min 2X/week       AM-PAC OT 6 Clicks Daily Activity     Outcome Measure Help from another person eating meals?: None Help from another person taking care of personal grooming?: None Help from another person toileting, which includes using toliet, bedpan, or urinal?: Total Help from another person bathing (including washing, rinsing, drying)?: A Lot Help from another person to put on and taking off regular upper body clothing?: A Little Help from another person to put on and taking off regular lower body clothing?: A Lot 6 Click Score: 16   End of Session Nurse Communication: Mobility status  Activity Tolerance: Patient tolerated treatment well Patient left: in bed;with call bell/phone within reach;with bed alarm set;with family/visitor present  OT Visit Diagnosis: Muscle weakness (generalized) (M62.81)                Time: 8484-8459 OT Time Calculation (min): 25 min Charges:  OT General Charges $OT Visit: 1 Visit OT Evaluation $OT Eval Moderate Complexity: 1 Mod OT Treatments $  Therapeutic Activity: 8-22 mins  Izetta Claude, MS, OTR/L , CBIS ascom [phone]  01/21/24, 4:26 PM

## 2024-01-21 NOTE — Evaluation (Signed)
 Physical Therapy Evaluation Patient Details Name: [name] MRN: [id number] DOB: 11/16/1950 Today's Date: 01/21/2024  History of Present Illness  [name] is a 73 y.o. male with history of hypertension, chronic pain disorder, neuromuscular disorder, chronic left dropfoot, presenting with right knee pain after fall.  Patient states that he was trying to transfer into his wheelchair, the wheels were not locked, he fell and landed on his right knee.  States that he heard a pop, unsure if he felt it dislocate.   Clinical Impression  Pt admitted with above diagnosis. Pt currently with functional limitations due to the deficits listed below (see PT Problem List). Pt received upright in bed agreeable to PT services. Reports PTA being mod-I with manual w/c propulsion and lateral scoot transfers for entering/exiting bed and SPT to and from toilet in bathroom with grab bar. Wife assists with IADL's.   To date, educated pt on need for lateral scoot transfers currently due to no plans for ortho consult and unknown WB status due to acute R knee fx. KI donned on RLE. Globally needs minA on RLE, bed features to transfer to EOB which is very laborious for pt having to utilize BUE's to assist in mobilizing LLE and for core stability ultimately needing minA on RLE. Pt unable to safely don shoes but is able to figure 4 LLE onto R knee and attempt but due to poor core stability and need for BUE support unable to complete without PT. Pt with minimal lateral scoots to the R to manual w/c but pt is able to verball report set up and need for locked brakes. Poor gluteal clearance despite VC's for BUE pushing and use of LLE to clear buttocks. Max to totalA from PT performed but still unable to safely mobilize pt to w/c safely deferring further attempts as pt will require 2 person assist. Pt is reliant on maxA to laterally scoot L to [facility] and returns to supine minA at LE's. All needs in reach with RN present at  bedside. Pt with significant change in functional capacity and would benefit from skilled PT services < 3 hours/day to address acute mobility deficits to reduce falls risk and maximize return to PLOF.       If plan is discharge home, recommend the following: A lot of help with bathing/dressing/bathroom;Two people to help with walking and/or transfers;Help with stairs or ramp for entrance;Assistance with cooking/housework   Can travel by private vehicle   No    Equipment Recommendations Wheelchair (measurements PT)  Recommendations for Other Services       Functional Status Assessment Patient has had a recent decline in their functional status and demonstrates the ability to make significant improvements in function in a reasonable and predictable amount of time.     Precautions / Restrictions Precautions Precautions: Fall Precaution/Restrictions Comments: KI donned. No specifc Wb status orders and doesn't appear to have plans for ortho consult per NSG Restrictions Other Position/Activity Restrictions: treated as NWB RLE      Mobility  Bed Mobility Overal bed mobility: Needs Assistance Bed Mobility: Supine to Sit, Sit to Supine     Supine to sit: HOB elevated, Used rails, Min assist Sit to supine: Used rails   General bed mobility comments: requires minA at RLE throughout and anterior scoot Patient Response: Cooperative  Transfers Overall transfer level: Needs assistance   Transfers: Bed to chair/wheelchair/BSC            Lateral/Scoot Transfers: Total assist General transfer comment:  varying max and total A to laterally scoot. UNable to really safely mobilize from EOB to w/c safely without additional personnel. Pt and PT deferring further attempts. Did not attempt standing due to no known WB status/orders due to R knee fracture.    Ambulation/Gait               General Gait Details: unable at baseline. UNable to propel manual w/c due to inability to safely  transfer to it  Stairs            Wheelchair Mobility     Tilt Bed Tilt Bed Patient Response: Cooperative  Modified Rankin (Stroke Patients Only)       Balance Overall balance assessment: Needs assistance Sitting-balance support: Bilateral upper extremity supported, Feet supported Sitting balance-Leahy Scale: Poor Sitting balance - Comments: regular posterior LOB needing constant SUE to BUE support on bed rails to assist in sitting. ABle to palce LLE in figure four to attempt donning L shoe but has to return to supine EOB to attempt. Postural control: Posterior lean   Standing balance-Leahy Scale: Zero Standing balance comment: deferred due to unknown RLE WB status                             Pertinent Vitals/Pain Pain Assessment Pain Assessment: Faces Faces Pain Scale: Hurts even more Pain Descriptors / Indicators: Grimacing, Guarding Pain Intervention(s): Limited activity within patient's tolerance, Monitored during session, Repositioned    Home Living Family/patient expects to be discharged to:: Private residence Living Arrangements: Spouse/significant other Available Help at Discharge: Family;Available 24 hours/day Type of Home: House Home Access: Ramped entrance       Home Layout: One level Home Equipment: Wheelchair - manual;Shower seat;Grab bars - toilet;Hand held shower head Additional Comments: per pt, w/c has broken locks and can't use arm rests b/c it wont fit through door ways    Prior Function Prior Level of Function : Independent/Modified Independent;Needs assist       Physical Assist : ADLs (physical)     Mobility Comments: mod-I using manual w/c. lateral scoots to and from bed, SPT  to and from toilet ADLs Comments: spouse drives, assists with IADL's     Extremity/Trunk Assessment   Upper Extremity Assessment Upper Extremity Assessment: Defer to OT evaluation    Lower Extremity Assessment Lower Extremity Assessment:  Generalized weakness;LLE deficits/detail;RLE deficits/detail RLE Deficits / Details: RLE in KI LLE Deficits / Details: chronic L foot drop       Communication   Communication Communication: No apparent difficulties    Cognition Arousal: Alert Behavior During Therapy: WFL for tasks assessed/performed   PT - Cognitive impairments: No apparent impairments                         Following commands: Intact       Cueing Cueing Techniques: Verbal cues, Gestural cues     General Comments      Exercises Other Exercises Other Exercises: unknown WB thus need for lateral scoot transfers at this time for safe compliance   Assessment/Plan    PT Assessment Patient needs continued PT services  PT Problem List Decreased strength;Pain;Decreased range of motion;Decreased activity tolerance;Decreased balance;Decreased safety awareness;Decreased mobility       PT Treatment Interventions DME instruction;Balance training;Neuromuscular re-education;Functional mobility training;Patient/family education;Therapeutic activities;Therapeutic exercise    PT Goals (Current goals can be found in the Care Plan section)  Acute Rehab PT  Goals Patient Stated Goal: to improve transfers back to PLOF PT Goal Formulation: With patient Time For Goal Achievement: 02/04/24 Potential to Achieve Goals: Good    Frequency Min 3X/week     Co-evaluation               AM-PAC PT 6 Clicks Mobility  Outcome Measure Help needed turning from your back to your side while in a flat bed without using bedrails?: A Lot Help needed moving from lying on your back to sitting on the side of a flat bed without using bedrails?: A Lot Help needed moving to and from a bed to a chair (including a wheelchair)?: Total Help needed standing up from a chair using your arms (e.g., wheelchair or bedside chair)?: Total Help needed to walk in hospital room?: Total Help needed climbing 3-5 steps with a railing? :  Total 6 Click Score: 8    End of Session Equipment Utilized During Treatment: Gait belt;Right knee immobilizer Activity Tolerance: Patient tolerated treatment well Patient left: in bed;with call bell/phone within reach;with bed alarm set;with nursing/sitter in room Nurse Communication: Mobility status PT Visit Diagnosis: Other abnormalities of gait and mobility (R26.89);Muscle weakness (generalized) (M62.81);Pain Pain - Right/Left: Right Pain - part of body: Knee    Time: 0955-1030 PT Time Calculation (min) (ACUTE ONLY): 35 min   Charges:   PT Evaluation $PT Eval Moderate Complexity: 1 Mod PT Treatments $Therapeutic Activity: 8-22 mins PT General Charges $$ ACUTE PT VISIT: 1 Visit        Dorina HERO. Fairly IV, PT, DPT Physical Therapist- [place]  [facility] 01/21/2024, 11:13 AM

## 2024-01-21 NOTE — TOC Initial Note (Signed)
 Transition of Care [facility]) - Initial/Assessment Note    Patient Details  Name: [name] MRN: [id number] Date of Birth: 15-Jun-1951  [facility] CM/SW Contact:    Edsel DELENA Fischer, LCSW Phone Number: 01/21/2024, 9:36 AM  Clinical Narrative:                 SW met with pt in ED.  Pt stated he lives with his wife.  Current HH provider is [facility] and has DME that includes wheelchair, bedside commode.  PCP identified Dr. Sadie, Pharmacy is [facility] and wife will assist with transportation at d/c.  CM will follow up with pt after PT eval regarding potential SNF placement if deemed appropriate.     Barriers to Discharge: Continued Medical Work up   Patient Goals and CMS Choice            Expected Discharge Plan and Services In-house Referral: Clinical Social Work     Living arrangements for the past 2 months: Single Family Home                   DME Agency: Other - Comment (pt currently receiving HH services from [facility])                  Prior Living Arrangements/Services Living arrangements for the past 2 months: Single Family Home Lives with:: Spouse Patient language and need for interpreter reviewed:: Yes Do you feel safe going back to the place where you live?: Yes        Care giver support system in place?: Yes (comment) Current home services: DME, Home PT    Activities of Daily Living      Permission Sought/Granted                  Emotional Assessment Appearance:: Appears stated age Attitude/Demeanor/Rapport: Engaged Affect (typically observed): Appropriate Orientation: : Oriented to Self, Oriented to Place, Oriented to  Time, Oriented to Situation   Psych Involvement: No (comment)  Admission diagnosis:  Knee Injury Patient Active Problem List   Diagnosis Date Noted   Basal cell carcinoma (BCC) of cheek 10/27/2019   Impaired mobility and endurance 12/21/2018   Pressure injury of sacral region, stage 4 (HCC)  08/04/2018   Decubitus ulcer of buttock, stage 4 (HCC) 07/09/2018   Sepsis (HCC) 05/20/2018   Pressure injury of skin 05/20/2018   Hypertension 02/05/2017   Paraparesis of both lower limbs (HCC) 01/19/2017   Prostate cancer (HCC) 01/19/2017   Essential hypertension 04/07/2016   Carpal tunnel syndrome, bilateral 06/29/2014   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 [place] [address] Phone: [phone] Fax: [phone]  [facility] - [place], [place] - [address] [place] [address] Phone: [phone] Fax: [phone]     Social Drivers of Health (SDOH) Social History: SDOH Screenings   Food Insecurity: No Food Insecurity (11/03/2023)   Received from [facility]  Housing: Low Risk  (11/03/2023)   Received from [facility]  Transportation Needs: No Transportation Needs (11/03/2023)   Received from [facility]  Utilities: Not At Risk (11/03/2023)   Received from [facility]  Financial Resource Strain: Low Risk  (11/03/2023)   Received from [facility]  Physical Activity: Insufficiently Active (02/07/2020)   [facility] from [facility]  Social Connections: Unknown (11/17/2021)   Received from [facility]  Stress: No Stress Concern Present (02/07/2020)   Received from [facility]  Tobacco Use: Medium Risk (01/20/2024)  Health Literacy: Medium Risk (02/07/2020)   Received from [facility]   SDOH Interventions:     Readmission Risk  Interventions     No data to display

## 2024-01-22 DIAGNOSIS — S82831A Other fracture of upper and lower end of right fibula, initial encounter for closed fracture: Secondary | ICD-10-CM | POA: Diagnosis present

## 2024-01-22 DIAGNOSIS — S82101A Unspecified fracture of upper end of right tibia, initial encounter for closed fracture: Secondary | ICD-10-CM | POA: Diagnosis not present

## 2024-01-22 DIAGNOSIS — K592 Neurogenic bowel, not elsewhere classified: Secondary | ICD-10-CM | POA: Diagnosis present

## 2024-01-22 DIAGNOSIS — N319 Neuromuscular dysfunction of bladder, unspecified: Secondary | ICD-10-CM | POA: Diagnosis present

## 2024-01-22 DIAGNOSIS — S82231A Displaced oblique fracture of shaft of right tibia, initial encounter for closed fracture: Secondary | ICD-10-CM | POA: Diagnosis not present

## 2024-01-22 DIAGNOSIS — I10 Essential (primary) hypertension: Secondary | ICD-10-CM | POA: Diagnosis present

## 2024-01-22 DIAGNOSIS — Z87442 Personal history of urinary calculi: Secondary | ICD-10-CM | POA: Diagnosis not present

## 2024-01-22 DIAGNOSIS — Z79899 Other long term (current) drug therapy: Secondary | ICD-10-CM | POA: Diagnosis not present

## 2024-01-22 DIAGNOSIS — S82101D Unspecified fracture of upper end of right tibia, subsequent encounter for closed fracture with routine healing: Secondary | ICD-10-CM | POA: Diagnosis not present

## 2024-01-22 DIAGNOSIS — S82141A Displaced bicondylar fracture of right tibia, initial encounter for closed fracture: Secondary | ICD-10-CM | POA: Diagnosis present

## 2024-01-22 DIAGNOSIS — Z993 Dependence on wheelchair: Secondary | ICD-10-CM | POA: Diagnosis not present

## 2024-01-22 DIAGNOSIS — I493 Ventricular premature depolarization: Secondary | ICD-10-CM | POA: Diagnosis present

## 2024-01-22 DIAGNOSIS — K5909 Other constipation: Secondary | ICD-10-CM | POA: Diagnosis present

## 2024-01-22 DIAGNOSIS — Z880 Allergy status to penicillin: Secondary | ICD-10-CM | POA: Diagnosis not present

## 2024-01-22 DIAGNOSIS — Z87891 Personal history of nicotine dependence: Secondary | ICD-10-CM | POA: Diagnosis not present

## 2024-01-22 DIAGNOSIS — Z8546 Personal history of malignant neoplasm of prostate: Secondary | ICD-10-CM | POA: Diagnosis not present

## 2024-01-22 DIAGNOSIS — M25461 Effusion, right knee: Secondary | ICD-10-CM | POA: Diagnosis present

## 2024-01-22 DIAGNOSIS — Z882 Allergy status to sulfonamides status: Secondary | ICD-10-CM | POA: Diagnosis not present

## 2024-01-22 DIAGNOSIS — G8220 Paraplegia, unspecified: Secondary | ICD-10-CM | POA: Diagnosis present

## 2024-01-22 DIAGNOSIS — S8991XD Unspecified injury of right lower leg, subsequent encounter: Secondary | ICD-10-CM | POA: Diagnosis not present

## 2024-01-22 DIAGNOSIS — G894 Chronic pain syndrome: Secondary | ICD-10-CM | POA: Diagnosis present

## 2024-01-22 DIAGNOSIS — Z6827 Body mass index (BMI) 27.0-27.9, adult: Secondary | ICD-10-CM | POA: Diagnosis not present

## 2024-01-22 DIAGNOSIS — Y92009 Unspecified place in unspecified non-institutional (private) residence as the place of occurrence of the external cause: Secondary | ICD-10-CM | POA: Diagnosis not present

## 2024-01-22 DIAGNOSIS — W19XXXA Unspecified fall, initial encounter: Secondary | ICD-10-CM | POA: Diagnosis present

## 2024-01-22 DIAGNOSIS — I700 Atherosclerosis of aorta: Secondary | ICD-10-CM | POA: Diagnosis present

## 2024-01-22 DIAGNOSIS — S34109S Unspecified injury to unspecified level of lumbar spinal cord, sequela: Secondary | ICD-10-CM | POA: Diagnosis not present

## 2024-01-22 DIAGNOSIS — E663 Overweight: Secondary | ICD-10-CM | POA: Diagnosis present

## 2024-01-22 DIAGNOSIS — Z87440 Personal history of urinary (tract) infections: Secondary | ICD-10-CM | POA: Diagnosis not present

## 2024-01-22 DIAGNOSIS — S82201A Unspecified fracture of shaft of right tibia, initial encounter for closed fracture: Secondary | ICD-10-CM | POA: Diagnosis present

## 2024-01-22 DIAGNOSIS — Z881 Allergy status to other antibiotic agents status: Secondary | ICD-10-CM | POA: Diagnosis not present

## 2024-01-22 DIAGNOSIS — N390 Urinary tract infection, site not specified: Secondary | ICD-10-CM | POA: Diagnosis present

## 2024-01-22 LAB — CBC
HCT: 37.9 % — ABNORMAL LOW (ref 39.0–52.0)
Hemoglobin: 13 g/dL (ref 13.0–17.0)
MCH: 29.7 pg (ref 26.0–34.0)
MCHC: 34.3 g/dL (ref 30.0–36.0)
MCV: 86.7 fL (ref 80.0–100.0)
Platelets: 145 K/uL — ABNORMAL LOW (ref 150–400)
RBC: 4.37 MIL/uL (ref 4.22–5.81)
RDW: 12.2 % (ref 11.5–15.5)
WBC: 7.1 K/uL (ref 4.0–10.5)
nRBC: 0 % (ref 0.0–0.2)

## 2024-01-22 LAB — BASIC METABOLIC PANEL WITH GFR
Anion gap: 10 (ref 5–15)
BUN: 19 mg/dL (ref 8–23)
CO2: 28 mmol/L (ref 22–32)
Calcium: 8.8 mg/dL — ABNORMAL LOW (ref 8.9–10.3)
Chloride: 100 mmol/L (ref 98–111)
Creatinine, Ser: 0.52 mg/dL — ABNORMAL LOW (ref 0.61–1.24)
GFR, Estimated: >60 mL/min (ref >60)
Glucose, Bld: 114 mg/dL — ABNORMAL HIGH (ref 70–99)
Potassium: 4.2 mmol/L (ref 3.5–5.1)
Sodium: 138 mmol/L (ref 135–145)

## 2024-01-22 MED ORDER — DOCUSATE SODIUM 100 MG PO CAPS
100.0000 mg | ORAL_CAPSULE | Freq: Two times a day (BID) | ORAL | Status: DC
  Administered 2024-01-22: 100 mg via ORAL
  Filled 2024-01-22 (×7): qty 1

## 2024-01-22 MED ORDER — LOSARTAN POTASSIUM 50 MG PO TABS
100.0000 mg | ORAL_TABLET | Freq: Every day | ORAL | Status: DC
  Administered 2024-01-23 – 2024-01-24 (×2): 100 mg via ORAL
  Filled 2024-01-22 (×2): qty 2

## 2024-01-22 MED ORDER — MORPHINE SULFATE (PF) 2 MG/ML IV SOLN: 2.0000 mg | INTRAVENOUS | Status: DC | PRN

## 2024-01-22 MED ORDER — ONDANSETRON HCL 4 MG PO TABS
4.0000 mg | ORAL_TABLET | Freq: Four times a day (QID) | ORAL | Status: DC | PRN
Start: 2024-01-22 — End: 2024-01-27

## 2024-01-22 MED ORDER — POLYETHYLENE GLYCOL 3350 17 G PO PACK
17.0000 g | PACK | Freq: Every day | ORAL | Status: DC
  Administered 2024-01-22: 17 g via ORAL
  Filled 2024-01-22: qty 1

## 2024-01-22 MED ORDER — ONDANSETRON HCL 4 MG/2ML IJ SOLN: 4.0000 mg | Freq: Four times a day (QID) | INTRAMUSCULAR | Status: DC | PRN

## 2024-01-22 MED ORDER — POLYETHYLENE GLYCOL 3350 17 GM/SCOOP PO POWD: 17.0000 g | ORAL | Status: DC | PRN

## 2024-01-22 MED ORDER — ENOXAPARIN SODIUM 40 MG/0.4ML IJ SOSY
40.0000 mg | PREFILLED_SYRINGE | INTRAMUSCULAR | Status: DC
  Administered 2024-01-22 – 2024-01-26 (×5): 40 mg via SUBCUTANEOUS
  Filled 2024-01-22 (×5): qty 0.4

## 2024-01-22 NOTE — H&P (Signed)
 History and Physical    [name] FMW:[id number] DOB: [birth year] DOA: 01/20/2024  PCP: Sadie Manna, MD  Patient coming from:   I have personally briefly reviewed patient's old medical records in [facility]  Chief Complaint: Fall and right tibial fracture  HPI: [name] is a 73 y.o. 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  Patient injured his right knee.  He reports that the wheels were not locked and he fell and landed on his right knee.  Brought by EMS.  EDP talked to orthopedic on 01/21/2024 who recommended since patient is not ambulatory and wheelchair-bound he can be treated nonsurgically with knee immobilizer and follow-up with orthopedic surgery as an outpatient.  Evaluated by PT recommended SNF.  Patient continues to have pain 7 out of 10.  Patient needs to stay in the hospital for 3-day in order for insurance to cover the SNF cost.  Initial plan was to discharge patient home however due to increased risk for fall EDP consulted for admission.  Patient is wheelchair-bound since 25 years.  He does self cath couple of times in a day.  Lives with his wife at home.  No history of tobacco abuse, alcohol  abuse, illicit drug use  ED Course: Upon arrival: Patient afebrile, not tachycardic, not tachypneic, BP 112/70.  Maintaining oxygen saturation on room air.  Labs including CBC, CMP: WNL.  Troponin negative.  Right x-ray knee showed acute mildly displaced oblique fracture in the proximal right tibial metaphysis.  CT scan of right lower extremity shows redemonstrated oblique fracture in the proximal right tibial metadiaphysis.  Given large lab for hemarthrosis this likely  extend to the articular surface of the lateral tibial plateau however the exam is not optimized for fracture evaluation.  Additional nondisplaced fracture of the right fibular head.  EDP recommended admission since patient is not safe for the discharge due to persistent pain and high risk for fall  Review of Systems: As per HPI otherwise negative.    Past Medical History:  Diagnosis Date   AVM (arteriovenous malformation) spine    Chronic pain disorder    works with the [facility] for intrathecal pump meds and stimulator   Decubitus ulcer of buttock 2022   recently had skin flap surgery to use his own skin to cover pressure sore.  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   Prostate cancer (HCC)     taking Casodex  (hormone chemotherapy)   Sepsis (HCC)    Status post insertion of intrathecal baclofen  pump    Status post insertion of spinal cord stimulator     Past Surgical History:  Procedure Laterality Date   COLONOSCOPY     CYSTOSCOPY     several times   CYSTOSCOPY WITH DIRECT VISION INTERNAL URETHROTOMY  05/05/2017   Procedure: CYSTOSCOPY WITH DIRECT VISION INTERNAL URETHROTOMY;  Surgeon: Kassie Ozell SAUNDERS,  MD;  Location: [facility];  Service: Urology;;   CYSTOSCOPY WITH DIRECT VISION INTERNAL URETHROTOMY N/A 08/01/2021   Procedure: CYSTOSCOPY WITH DIRECT OPTILUMEVISION INTERNAL URETHROTOMY;  Surgeon: Kassie Ozell SAUNDERS, MD;  Location: [facility];  Service: Urology;  Laterality: N/A;   CYSTOSCOPY WITH DIRECT VISION INTERNAL URETHROTOMY N/A 05/15/2022   Procedure: CYSTOSCOPY WITH DIRECT VISION INTERNAL URETHROTOMY  OPTILUME;  Surgeon: Kassie Ozell SAUNDERS, MD;  Location: [facility];  Service: Urology;   Laterality: N/A;   CYSTOSCOPY WITH LITHOLAPAXY N/A 05/05/2017   Procedure: CYSTOSCOPY WITH LITHOLAPAXY;  Surgeon: Kassie Ozell SAUNDERS, MD;  Location: [facility];  Service: Urology;  Laterality: N/A;   CYSTOSCOPY WITH LITHOLAPAXY N/A 10/04/2020   Procedure: CYSTOSCOPY WITH LITHOPAXY WITH HOLMIUM LASER;  Surgeon: Kassie Ozell SAUNDERS, MD;  Location: [facility];  Service: Urology;  Laterality: N/A;   EXTRACORPOREAL SHOCK WAVE LITHOTRIPSY     EYE SURGERY Bilateral    cataract surgery   FREE SKIN GRAFT  2022   used on skin to cover pressure ulcer/decubit on right buttocks   HOLMIUM LASER APPLICATION N/A 05/05/2017   Procedure: HOLMIUM LASER APPLICATION;  Surgeon: Kassie Ozell SAUNDERS, MD;  Location: [facility];  Service: Urology;  Laterality: N/A;   KNEE SURGERY  1978   LUMBAR DISC SURGERY  2005   resulted in paralysis of lower extrems   SKIN DEBRIDEMENT Right 07/15/2018   Procedure: DEBRIDEMENT SKIN FULL THICKNESS--stage 4 decubitus ulcer;  Surgeon: Marolyn Nest, MD;  Location: [facility];  Service: General;  Laterality: Right;     reports that he has never smoked. He quit smokeless tobacco use about 35 years ago.  His smokeless tobacco use included snuff. He reports that he does not drink alcohol  and does not use drugs.  Allergies  Allergen Reactions   Clindamycin Hives   Doxycycline  Rash   Penicillins Rash   Sulfamethoxazole Rash    History reviewed. No pertinent family history.  Prior to Admission medications   Medication Sig Start Date End Date Taking? Authorizing Provider  acetaminophen  (TYLENOL ) 500 MG tablet Take 1,000 mg by mouth every 6 (six) hours as needed.   Yes [provider]  ascorbic acid  (VITAMIN C ) 500 MG tablet Take 500 mg by mouth Once Daily. 04/07/16  Yes [provider]  ciprofloxacin  (CIPRO ) 500 MG tablet Take 1 tablet (500 mg total) by mouth 2 (two) times daily. 05/15/22  Yes Kassie Ozell SAUNDERS, MD  Cyanocobalamin (VITAMIN B 12 PO) Take 1 tablet by mouth 3  (three) times a week.   Yes [provider]  docusate sodium  (COLACE) 50 MG capsule Take 100 mg by mouth 2 (two) times daily.   Yes [provider]  gabapentin  (NEURONTIN ) 800 MG tablet Take 1 tablet (800 mg total) by mouth 3 (three) times daily. Patient taking differently: Take 800 mg by mouth in the morning, at noon, in the evening, and at bedtime. 07/20/18  Yes Schulz, Zachary R, [name]  losartan  (COZAAR ) 100 MG tablet Take 100 mg by mouth daily with lunch.   Yes [provider]  methenamine  (HIPREX ) 1 g tablet Take 1 g by mouth 3 (three) times daily.    Yes [provider]  Multiple Vitamin (MULTIVITAMIN) tablet Take 1 tablet by mouth 3 (three) times a week.   Yes [provider]  Omega-3 Fatty Acids (FISH OIL PO) Take 1 tablet by mouth 3 (three) times a week.   Yes [provider]  oxyCODONE  (ROXICODONE ) 5 MG immediate release tablet Take 1 tablet (  5 mg total) by mouth every 8 (eight) hours as needed for up to 12 doses. 01/21/24  Yes Waymond Lorelle Cummins, MD  PAIN MANAGEMENT INTRATHECAL, IT, PUMP 1 each by Intrathecal route continuous. Intrathecal (IT) medication:  Baclofen , Dilaudid    Yes [provider]  polyethylene glycol powder (GLYCOLAX /MIRALAX ) powder Take 17 g by mouth as needed. 08/09/10  Yes [provider]  Propylene Glycol 0.6 % SOLN Place 1 drop into both eyes daily as needed (dry eyes).   Yes [provider]  Trospium Chloride 60 MG CP24 Take 60 mg by mouth daily. 11/26/23  Yes [provider]  Multiple Vitamins-Minerals (ONE DAILY CALCIUM/IRON) TABS Take 1 tablet by mouth.    [provider]    Physical Exam: Vitals:   01/21/24 0623 01/21/24 1837 01/21/24 2130 01/21/24 2230  BP: (!) 141/74 121/61 (!) 124/111 (!) 103/56  Pulse: 91 82 88 85  Resp: 18 14  18   Temp:  99.1 F (37.3 C)  98.8 F (37.1 C)  TempSrc:      SpO2: 99% 97% 99% 99%    Constitutional: NAD, calm, comfortable, on room  air, communicating well Eyes: PERRL, lids and conjunctivae normal ENMT: Mucous membranes are moist. Posterior pharynx clear of any exudate or lesions.Normal dentition.  Neck: normal, supple, no masses, no thyromegaly Respiratory: clear to auscultation bilaterally, no wheezing, no crackles. Normal respiratory effort. No accessory muscle use.  Cardiovascular: Regular rate and rhythm, no murmurs / rubs / gallops. No extremity edema. 2+ pedal pulses. No carotid bruits.  Abdomen: no tenderness, no masses palpated. No hepatosplenomegaly. Bowel sounds positive.  Musculoskeletal: Right leg: In knee immobilizer Skin: no rashes, lesions, ulcers. No induration Neurologic: Alert and oriented x 3.  Following commands.  Paraplegic.  Labs on Admission: I have personally reviewed following labs and imaging studies  CBC: Recent Labs  Lab 01/20/24 2311  WBC 7.1  NEUTROABS 4.5  HGB 14.5  HCT 43.0  MCV 87.9  PLT 180   Basic Metabolic Panel: Recent Labs  Lab 01/20/24 2311  NA 139  K 4.2  CL 103  CO2 27  GLUCOSE 114*  BUN 23  CREATININE 0.76  CALCIUM 9.1   GFR: CrCl cannot be calculated (Unknown ideal weight.). Liver Function Tests: Recent Labs  Lab 01/20/24 2311  AST 25  ALT 25  ALKPHOS 73  BILITOT 0.3  PROT 7.0  ALBUMIN 4.0   No results for input(s): LIPASE, AMYLASE in the last 168 hours. No results for input(s): AMMONIA in the last 168 hours. Coagulation Profile: No results for input(s): INR, PROTIME in the last 168 hours. Cardiac Enzymes: No results for input(s): CKTOTAL, CKMB, CKMBINDEX, TROPONINI in the last 168 hours. BNP (last 3 results) No results for input(s): PROBNP in the last 8760 hours. HbA1C: No results for input(s): HGBA1C in the last 72 hours. CBG: No results for input(s): GLUCAP in the last 168 hours. Lipid Profile: No results for input(s): CHOL, HDL, LDLCALC, TRIG, CHOLHDL, LDLDIRECT in the last 72 hours. Thyroid  Function Tests: No results for input(s): TSH, T4TOTAL, FREET4, T3FREE, THYROIDAB in the last 72 hours. Anemia Panel: No results for input(s): VITAMINB12, FOLATE, FERRITIN, TIBC, IRON, RETICCTPCT in the last 72 hours. Urine analysis:    Component Value Date/Time   COLORURINE YELLOW (A) 07/19/2018 1604   APPEARANCEUR CLEAR (A) 07/19/2018 1604   LABSPEC 1.013 07/19/2018 1604   PHURINE 7.0 07/19/2018 1604   GLUCOSEU NEGATIVE 07/19/2018 1604   HGBUR NEGATIVE 07/19/2018 1604   BILIRUBINUR  NEGATIVE 07/19/2018 1604   KETONESUR NEGATIVE 07/19/2018 1604   PROTEINUR NEGATIVE 07/19/2018 1604   NITRITE NEGATIVE 07/19/2018 1604   LEUKOCYTESUR NEGATIVE 07/19/2018 1604    Radiological Exams on Admission: CT ANGIO LOWER EXT BILAT W &/OR WO CONTRAST Result Date: 01/21/2024 CLINICAL DATA:  Knee trauma, dislocation suspected, EXAM: CT ANGIOGRAPHY OF THE RIGHT/LEFT UPPER/LOWEREXTREMITY TECHNIQUE: Multidetector CT imaging of the right/left upper/lowerwas performed using the standard protocol during bolus administration of intravenous contrast. Multiplanar CT image reconstructions and MIPs were obtained to evaluate the vascular anatomy. RADIATION DOSE REDUCTION: This exam was performed according to the departmental dose-optimization program which includes automated exposure control, adjustment of the mA and/or kV according to patient size and/or use of iterative reconstruction technique. CONTRAST:  OMNIPAQUE  IOHEXOL  350 MG/ML SOLN COMPARISON:  None Available. FINDINGS: VASCULAR Aorta: Calcified atherosclerotic plaque.  No aneurysm or dissection. RIGHT Lower Extremity Inflow: Calcified atherosclerotic plaque. No aneurysm or dissection. Outflow: Patent without aneurysm or dissection. Runoff: Patent three vessel runoff to the ankle. LEFT Lower Extremity Inflow: Patent without aneurysm or dissection. Outflow: Patent without aneurysm or dissection. Runoff: Patent three vessel runoff to the  ankle. Veins: No acute abnormality on arterial phase study. Review of the MIP images confirms the above findings. NON-VASCULAR Large right lipohemarthrosis in the knee. Demineralization. Redemonstrated oblique fracture in the proximal right tibial metadiaphysis. Given large lipohemarthrosis this likely extends into the articular surface of the lateral tibial plateau however the exam is not optimized for fracture evaluation. Additional nondisplaced fracture of the fibular head. No dislocation. IMPRESSION: 1. No evidence of acute vascular injury. 2. Redemonstrated oblique fracture in the proximal right tibial metadiaphysis. Given large lipohemarthrosis this likely extends into the articular surface of the lateral tibial plateau however the exam is not optimized for fracture evaluation. 3. Additional nondisplaced fracture of the right fibular head. 4. Aortic Atherosclerosis (ICD10-I70.0). Electronically Signed   By: Norman Gatlin M.D.   On: 01/21/2024 01:22   DG Knee Complete 4 Views Right Result Date: 01/21/2024 CLINICAL DATA:  Knee pain and swelling. Fell at home transferring from toilet to wheelchair. EXAM: RIGHT KNEE - COMPLETE 4+ VIEW COMPARISON:  None Available. FINDINGS: Acute mildly displaced oblique fracture in the proximal right tibial metaphysis. Question extension of the fracture into the lateral tibial plateau. Demineralization limits assessment. Moderate knee joint effusion. IMPRESSION: Acute mildly displaced oblique fracture in the proximal right tibial metaphysis. Question extension of the fracture into the lateral tibial plateau. Moderate knee joint effusion. Electronically Signed   By: Norman Gatlin M.D.   On: 01/21/2024 00:28    EKG: Independently reviewed.   Assessment/Plan  Right tibiofibular fracture: - Status post fall. - Reviewed x-ray and CT angio of right lower extremity. - Discussed with Ortho Dr. Patel-recommended nonoperative management since patient is paraplegic and  wheelchair-bound.  start DVT prophylaxis - Continue as needed pain medications. - PT on board recommend SNF.  Monitor H&H - Child psychotherapist on board for SNF placement.  Chronic pain syndrome Paraplegic: Lumbar spinal cord injury: Continue gabapentin .  Followed by pain management.  Hypertension: Stable.  Continue losartan   Chronic constipation: Continue Colace and MiraLAX   Neurogenic bladder: Patient self caths-4-6 time per day  History of prostate cancer:aware   DVT prophylaxis: Lovenox  Code Status: Full code Family Communication: None present at bedside.  Plan of care discussed with patient in length and he verbalized understanding and agreed with it. Disposition Plan: SNF Consults called: Orthopedic surgery Admission status: Inpatient   Velna JONELLE Skeeter MD  [facility]  If 7PM-7AM, please contact night-coverage [email or web address]  01/22/2024, 4:35 PM

## 2024-01-22 NOTE — Progress Notes (Signed)
 I initially talked to the emergency department physician regarding this patient on the night of admission.  I reviewed the images at that time, and they showed a minimally displaced right tibial plateau fracture.  Given the minimal amount of displacement and patient's underlying paraplegia, we had agreed to proceed with nonoperative management.  Indication from the emergency department staff was that the patient would be discharged home.  I was not made aware that the patient was still an inpatient until just now.  Preliminary recommendations: 1.  No current plan for surgical intervention  2.  Nonweightbearing on the right lower extremity in a knee immobilizer. 3.  Would recommend DVT prophylaxis given lower extremity fracture and increased immobilization 4.  PT/OT 5.  Patient may follow-up with [facility] clinic orthopedics in 1-2 weeks.  Please page with any further questions.

## 2024-01-22 NOTE — Plan of Care (Signed)
   Problem: Education: Goal: Knowledge of General Education information will improve Description: Including pain rating scale, medication(s)/side effects and non-pharmacologic comfort measures Outcome: Progressing   Problem: Health Behavior/Discharge Planning: Goal: Ability to manage health-related needs will improve Outcome: Progressing   Problem: Activity: Goal: Risk for activity intolerance will decrease Outcome: Progressing

## 2024-01-22 NOTE — Progress Notes (Signed)
 Physical Therapy Treatment Patient Details Name: [name] MRN: [id number] DOB: 1951/01/30 Today's Date: 01/22/2024   History of Present Illness [name] is a 73 y.o. male with history of hypertension, chronic pain disorder, neuromuscular disorder, chronic left dropfoot, presenting with right knee pain after fall.  Patient states that he was trying to transfer into his wheelchair, the wheels were not locked, he fell and landed on his right knee.  States that he heard a pop, unsure if he felt it dislocate.    PT Comments  Pt received upright in bed with family present. Purpose of today's session to educate pt and family on hoyer use if pt were to d/c home due to inability to be placed at [facility]. Chartered loss adjuster honest with family that PT still recommending STR as hoyer lift at hospitals are electric compared to manual for home use and would require manual pump to elevate and decline pt. Discussed on inability to fully train pt and family on use due to these discrepancies. Dicussed further on other DME needs like hospital bed and manual w/c and need for elevated leg rests. Also discussed likely need for layout in living room due to needing adequate space of hoyer lift. Pt reliant on minA and bed rails for rolling R/L for donning hoyer sling with education on placement of sling. Discussed need to lock brakes, placement of crane, and how to secure straps prior to elevation. Pt elevated for visual purposes then brought back down onto bed with education on removing straps and sling with rolling. Pt would not be able to roll R/L in current bed due to need of UE use on bed rails and physical support and would be reliant on spouse and daughter to assist. Pt's family does not appear comfortable with education and home completion especially with discrepancies in hospital lift and unknown size and functions of hoyer lift that would be needed at home setting. Continuing to heavily advocate for pt and family on need  for STR placement due to falls risk and risk of injury to pt and/or family with mobilization of pt. Pt left in care of family with all needs in reach.       If plan is discharge home, recommend the following: A lot of help with bathing/dressing/bathroom;Two people to help with walking and/or transfers;Help with stairs or ramp for entrance;Assistance with cooking/housework   Can travel by private vehicle     No  Equipment Recommendations  Wheelchair (measurements PT);Hospital bed;Hoyer lift (elevating leg rests for wheel chair)    Recommendations for Other Services       Precautions / Restrictions Precautions Precautions: Fall Precaution/Restrictions Comments: KI donned. No specifc Wb status orders. No ortho consult note. Per NSG, plan for ortho follow up outpt Restrictions Other Position/Activity Restrictions: treated as NWB RLE     Mobility  Bed Mobility                    Transfers                        Ambulation/Gait                   Stairs             Wheelchair Mobility     Tilt Bed    Modified Rankin (Stroke Patients Only)       Balance  Communication Communication Communication: No apparent difficulties  Cognition Arousal: Alert Behavior During Therapy: WFL for tasks assessed/performed   PT - Cognitive impairments: No apparent impairments                         Following commands: Intact      Cueing Cueing Techniques: Verbal cues, Gestural cues  Exercises Other Exercises Other Exercises: education to pt and family on safe hoyer lift for home use, DME needs    General Comments        Pertinent Vitals/Pain Pain Assessment Pain Assessment: Faces Faces Pain Scale: Hurts little more Pain Location: R knee Pain Descriptors / Indicators: Discomfort, Guarding, Grimacing Pain Intervention(s): Limited activity within patient's tolerance,  Monitored during session, Repositioned    Home Living                          Prior Function            PT Goals (current goals can now be found in the care plan section) Acute Rehab PT Goals Patient Stated Goal: to improve transfers back to PLOF PT Goal Formulation: With patient Time For Goal Achievement: 02/04/24 Potential to Achieve Goals: Good Progress towards PT goals: Progressing toward goals    Frequency    Min 3X/week      PT Plan      Co-evaluation              AM-PAC PT 6 Clicks Mobility   Outcome Measure  Help needed turning from your back to your side while in a flat bed without using bedrails?: A Lot Help needed moving from lying on your back to sitting on the side of a flat bed without using bedrails?: A Lot Help needed moving to and from a bed to a chair (including a wheelchair)?: Total Help needed standing up from a chair using your arms (e.g., wheelchair or bedside chair)?: Total Help needed to walk in hospital room?: Total Help needed climbing 3-5 steps with a railing? : Total 6 Click Score: 8    End of Session Equipment Utilized During Treatment: Right knee immobilizer Activity Tolerance: Patient tolerated treatment well Patient left: in bed;with call bell/phone within reach;with bed alarm set;with family/visitor present Nurse Communication: Mobility status PT Visit Diagnosis: Other abnormalities of gait and mobility (R26.89);Muscle weakness (generalized) (M62.81);Pain Pain - Right/Left: Right Pain - part of body: Knee     Time: 1445-1516 PT Time Calculation (min) (ACUTE ONLY): 31 min  Charges:    $Self Care/Home Management: 23-37 PT General Charges $$ ACUTE PT VISIT: 1 Visit                    Dorina HERO. Fairly IV, PT, DPT Physical Therapist- [facility] 01/22/2024, 3:40 PM

## 2024-01-22 NOTE — ED Notes (Signed)
 This tech placed pt on bedpan to have a bowel movement pt was successful. Changed sheets and placed new chucks on bed.

## 2024-01-22 NOTE — TOC Progression Note (Signed)
 Transition of Care [facility]) - Progression Note    Patient Details  Name: [name] MRN: [id number] Date of Birth: [birth year]  Transition of Care [facility]) CM/SW Contact  Edsel DELENA Fischer, LCSW Phone Number: 01/22/2024, 10:53 AM  Clinical Narrative:    SW met with pt at bedside.  Pt wife and daughter were present in the room as well.  SW spoke with pt and family about SNF placement.  SW expressed that a 3 day inpatient stay is required for insurance to cover cost.  SW expressed that pt and family would have to pay out of pocket if wanting SNF at this time.  SW stated that sw can assist with setting pt up with [facility].  Wife and daughter expressed that it will be difficult to assist pt in the home at this time.  SW expressed again that at this time sw can assist with SNF placment but insurance will not cover cost due to requirements. SW also expressed that with [facility] and aide can possibly be added to services however once services end so will the aide support. SW expressed that pt and family could acquire private aide support if needed as well.  SW expressed that sw will follow up medical staff regardign SNF recommendation.  CM message PT (Milton Fairly) and Norlene Hue, RN and Oneil Budge, MD were added to correspondence.  CM expressed SNF placement and patient class.      Barriers to Discharge: Continued Medical Work up  Expected Discharge Plan and Services In-house Referral: Clinical Social Work     Living arrangements for the past 2 months: Single Family Home                   DME Agency: Other - Comment (pt currently receiving HH services from [employer])                   Social Determinants of Health (SDOH) Interventions SDOH Screenings   Food Insecurity: No Food Insecurity (11/03/2023)   Received from [facility]  Housing: Low Risk  (11/03/2023)   Received from [facility]  Transportation Needs: No Transportation Needs (11/03/2023)   Received  from [facility]  Utilities: Not At Risk (11/03/2023)   Received from [facility]  Financial Resource Strain: Low Risk  (11/03/2023)   Received from [facility]  Physical Activity: Insufficiently Active (02/07/2020)   Received from [facility]  Social Connections: Unknown (11/17/2021)   Received from [facility]  Stress: No Stress Concern Present (02/07/2020)   Received from [facility]  Tobacco Use: Medium Risk (01/20/2024)  Health Literacy: Medium Risk (02/07/2020)   Received from [facility]    Readmission Risk Interventions     No data to display

## 2024-01-22 NOTE — ED Provider Notes (Signed)
-----------------------------------------   7:40 AM on 01/22/2024 -----------------------------------------   Blood pressure (!) 103/56, pulse 85, temperature 98.8 F (37.1 C), resp. rate 18, SpO2 99%.  The patient is calm and cooperative at this time.  There have been no acute events since the last update.  Awaiting disposition plan from [facility] team.   Gordan Huxley, MD 01/22/24 [phone]

## 2024-01-23 DIAGNOSIS — S82101A Unspecified fracture of upper end of right tibia, initial encounter for closed fracture: Secondary | ICD-10-CM

## 2024-01-23 LAB — URINALYSIS, COMPLETE (UACMP) WITH MICROSCOPIC
Bilirubin Urine: NEGATIVE
Glucose, UA: NEGATIVE mg/dL
Hgb urine dipstick: NEGATIVE
Ketones, ur: NEGATIVE mg/dL
Nitrite: NEGATIVE
Protein, ur: NEGATIVE mg/dL
Specific Gravity, Urine: 1.018 (ref 1.005–1.030)
pH: 5 (ref 5.0–8.0)

## 2024-01-23 LAB — CBC
HCT: 39.1 % (ref 39.0–52.0)
Hemoglobin: 13.2 g/dL (ref 13.0–17.0)
MCH: 29.3 pg (ref 26.0–34.0)
MCHC: 33.8 g/dL (ref 30.0–36.0)
MCV: 86.9 fL (ref 80.0–100.0)
Platelets: 149 K/uL — ABNORMAL LOW (ref 150–400)
RBC: 4.5 MIL/uL (ref 4.22–5.81)
RDW: 12.2 % (ref 11.5–15.5)
WBC: 6.8 K/uL (ref 4.0–10.5)
nRBC: 0 % (ref 0.0–0.2)

## 2024-01-23 NOTE — Progress Notes (Signed)
 Physical Therapy Treatment Patient Details Name: [name] MRN: [id number] DOB: 12/18/1950 Today's Date: 01/23/2024   History of Present Illness [name] is a 73 y.o. male with history of hypertension, chronic pain disorder, neuromuscular disorder, chronic left dropfoot, presenting with right knee pain after fall.  Patient states that he was trying to transfer into his wheelchair, the wheels were not locked, he fell and landed on his right knee.  States that he heard a pop, unsure if he felt it dislocate.    PT Comments  Pt was supine in bed upon arrival. HOB elevated ~ 20 degrees. Discussed at length PLOF and current limitations. Author adjusted RLE knee immobilizer to proper fit. He is A and O x 4 and agreeable to OOB activity. Pt does require increased time + min-mod assist to safely achieve EOB sitting. Once seated EOB with BUE support, pt performed slide board transfer from EOB to recliner. Pt unable to stay in recliner 2/2 to having incontinence BM. Pt required a lot more assistance to safely progress from recliner to higher EOB surface. Recommend +2 assistance for future transfers. Re-educated pt on importance of bed mobility and strengthening throughout the day if he wishes to return to PLOF. Acute PT will continue to follow and progress as abe per current POC. Pt remains far from his baseline and will continue to benefit from skilled PT at DC to maximize independence and safety with all ADLs.    If plan is discharge home, recommend the following: A lot of help with bathing/dressing/bathroom;Two people to help with walking and/or transfers;Help with stairs or ramp for entrance;Assistance with Systems developer (measurements PT);Wheelchair cushion (measurements PT);Hospital bed;Other (comment);BSC/3in1 (slideboard)       Precautions / Restrictions Precautions Precautions: Fall Precaution/Restrictions Comments: KI donned. NWB per  ortho note.  Plan for ortho follow up outpatient, chronic BLE paraplegia/weakness, self cath, Required Braces or Orthoses: Knee Immobilizer - Right Restrictions Weight Bearing Restrictions Per Provider Order: Yes Other Position/Activity Restrictions: NWB RLE     Mobility  Bed Mobility Overal bed mobility: Needs Assistance Bed Mobility: Supine to Sit, Sit to Supine  Supine to sit: HOB elevated, Used rails, Min assist, Mod assist Sit to supine: Used rails, Min assist     Transfers Overall transfer level: Needs assistance Equipment used: Sliding board Transfers: Bed to chair/wheelchair/BSC     Lateral/Scoot Transfers: Min assist, Mod assist, Max assist, With slide board, From elevated surface General transfer comment: min assist to use slideboard to transfer from bed to recliner. mod-max assist to slideboard back to bed due to BM while in recliner. Pt's BUE are very strong however BLEs + core have severe weakness    Ambulation/Gait    General Gait Details: unable at baseline    Balance Overall balance assessment: Needs assistance Sitting-balance support: Feet supported, Bilateral upper extremity supported Sitting balance-Leahy Scale: Fair Sitting balance - Comments: pt was able to sit EOB with BUE/LLE support x ~ 5 minutes with supervision only       Standing balance comment: unable       Communication Communication Communication: No apparent difficulties  Cognition Arousal: Alert Behavior During Therapy: Flat affect, WFL for tasks assessed/performed   PT - Cognitive impairments: No apparent impairments    PT - Cognition Comments: Pt is A and O x4 Following commands: Intact      Cueing Cueing Techniques: Verbal cues, Tactile cues     General Comments General comments (skin  integrity, edema, etc.): discussed need to improve transfer abilties and safety prior to [place] home. Pt agreeable to rehab at DC if unsafe to return home      Pertinent Vitals/Pain Pain  Assessment Pain Assessment: 0-10 Pain Score: 6  Pain Location: R knee Pain Descriptors / Indicators: Discomfort, Guarding, Grimacing Pain Intervention(s): Limited activity within patient's tolerance, Monitored during session, Premedicated before session, Repositioned     PT Goals (current goals can now be found in the care plan section) Acute Rehab PT Goals Patient Stated Goal: be independent again Progress towards PT goals: Progressing toward goals    Frequency    Min 3X/week       AM-PAC PT 6 Clicks Mobility   Outcome Measure  Help needed turning from your back to your side while in a flat bed without using bedrails?: A Lot Help needed moving from lying on your back to sitting on the side of a flat bed without using bedrails?: A Lot Help needed moving to and from a bed to a chair (including a wheelchair)?: A Lot Help needed standing up from a chair using your arms (e.g., wheelchair or bedside chair)?: Total Help needed to walk in hospital room?: Total Help needed climbing 3-5 steps with a railing? : Total 6 Click Score: 9    End of Session Equipment Utilized During Treatment: Right knee immobilizer Activity Tolerance: Patient tolerated treatment well;Patient limited by fatigue (limited by BM) Patient left: in bed;with call bell/phone within reach;with bed alarm set;with family/visitor present;with nursing/sitter in room (daughter/ grandson present) Nurse Communication: Mobility status PT Visit Diagnosis: Other abnormalities of gait and mobility (R26.89);Muscle weakness (generalized) (M62.81);Pain Pain - Right/Left: Right Pain - part of body: Knee     Time: 8850-8777 PT Time Calculation (min) (ACUTE ONLY): 33 min  Charges:    $Therapeutic Activity: 23-37 mins PT General Charges $$ ACUTE PT VISIT: 1 Visit                    Rankin Essex PTA 01/23/24, 1:36 PM

## 2024-01-23 NOTE — Plan of Care (Signed)

## 2024-01-23 NOTE — Consult Note (Signed)
 ORTHOPAEDIC CONSULTATION  REQUESTING PHYSICIAN: Dorinda Drue DASEN, MD  Chief Complaint:   R knee pain History of Present Illness: [name] is a 73 y.o. male paraplegic from history of spinal cord injury 20 years ago who presented to the ED on 01/20/2024 after a fall at home while attempting to transfer from the toilet to his wheelchair.  He notes a right knee injury.  I was initially informed about this patient by the emergency department staff on the night he presented to the ED due to a minimally displaced right medial tibial plateau fracture.  Based on the nature of the fracture and nonambulatory status of the patient, nonoperative management with nonweightbearing in a knee immobilizer was recommended.  I had been informed by the emergency department that the plan was to discharge the patient home.  I was notified yesterday evening that the patient was actually still at the hospital due to recommendation for SNF.  Patient required 3-day hospital stay necessary for insurance to cover SNF cost.  Patient currently states the pain is tolerable in a knee immobilizer. It is only about the knee. He does have swelling about the knee. He is wheelchair-bound. He has been in knee immobilizer since presentation.  Past Medical History:  Diagnosis Date   AVM (arteriovenous malformation) spine    Chronic pain disorder    works with the [facility] for intrathecal pump meds and stimulator   Decubitus ulcer of buttock 2022   recently had skin flap surgery to use his own skin to cover pressure sore.  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    Prostate cancer (HCC)     taking Casodex  (hormone chemotherapy)   Sepsis (HCC)    Status post insertion of intrathecal baclofen  pump    Status post insertion of spinal cord stimulator    Past Surgical History:  Procedure Laterality Date   COLONOSCOPY     CYSTOSCOPY     several times   CYSTOSCOPY WITH DIRECT VISION INTERNAL URETHROTOMY  05/05/2017   Procedure: CYSTOSCOPY WITH DIRECT VISION INTERNAL URETHROTOMY;  Surgeon: Kassie Ozell SAUNDERS, MD;  Location: [facility] ORS;  Service: Urology;;   CYSTOSCOPY WITH DIRECT VISION INTERNAL URETHROTOMY N/A 08/01/2021   Procedure: CYSTOSCOPY WITH DIRECT OPTILUMEVISION INTERNAL URETHROTOMY;  Surgeon: Kassie Ozell SAUNDERS, MD;  Location: [facility] OR[facility];  Service: Urology;  Laterality: N/A;   CYSTOSCOPY WITH DIRECT VISION INTERNAL URETHROTOMY N/A 05/15/2022   Procedure: CYSTOSCOPY WITH DIRECT VISION INTERNAL URETHROTOMY  OPTILUME;  Surgeon: Kassie Ozell SAUNDERS, MD;  Location: [facility] ORS;  Service: Urology;  Laterality: N/A;   CYSTOSCOPY WITH LITHOLAPAXY N/A 05/05/2017   Procedure: CYSTOSCOPY WITH LITHOLAPAXY;  Surgeon: Kassie Ozell SAUNDERS, MD;  Location: [facility] OR[facility];  Service: Urology;  Laterality: N/A;   CYSTOSCOPY WITH LITHOLAPAXY N/A 10/04/2020   Procedure: CYSTOSCOPY WITH LITHOPAXY WITH HOLMIUM LASER;  Surgeon: Kassie Ozell SAUNDERS, MD;  Location: [facility] OR[facility];  Service: Urology;  Laterality: N/A;   EXTRACORPOREAL SHOCK WAVE LITHOTRIPSY     EYE SURGERY Bilateral    cataract surgery   FREE SKIN GRAFT  2022   used on skin to cover pressure ulcer/decubit on right  buttocks   HOLMIUM LASER APPLICATION N/A 05/05/2017   Procedure: HOLMIUM LASER APPLICATION;  Surgeon: Kassie Ozell SAUNDERS, MD;  Location: [facility] OR[facility];  Service: Urology;  Laterality: N/A;   KNEE SURGERY  1978   LUMBAR DISC SURGERY  2005   resulted in paralysis of lower extrems   SKIN DEBRIDEMENT Right 07/15/2018   Procedure: DEBRIDEMENT SKIN FULL THICKNESS--stage 4 decubitus ulcer;  Surgeon: Marolyn Nest, MD;  Location: [facility] ORS;   Service: General;  Laterality: Right;   Social History   Socioeconomic History   Marital status: Married    Spouse name: Arts development officer   Number of children: Not on file   Years of education: Not on file   Highest education level: Not on file  Occupational History   Not on file  Tobacco Use   Smoking status: Never   Smokeless tobacco: Former    Types: Snuff    Quit date: 07/21/1988  Vaping Use   Vaping status: Never Used  Substance and Sexual Activity   Alcohol  use: No   Drug use: No   Sexual activity: Not Currently  Other Topics Concern   Not on file  Social History Narrative   Patient lives with wife. She is his helper but patient is self sufficient and healthy otherwise.   He has learned to take care of himself with minimal assistance.      Confined to a wheelchair   Social Drivers of Health   Financial Resource Strain: Low Risk  (11/03/2023)   Received [facility]   Overall Financial Resource Strain (CARDIA)    Difficulty of Paying Living Expenses: Not hard at all  Food Insecurity: No Food Insecurity (01/22/2024)   Hunger Vital Sign    Worried About Running Out of Food in the Last Year: Never true    Ran Out of Food in the Last Year: Never true  Transportation Needs: No Transportation Needs (01/22/2024)   PRAPARE - Administrator, Civil Service (Medical): No    Lack of Transportation (Non-Medical): No  Physical Activity: Insufficiently Active (02/07/2020)   Received from [facility]   Exercise Vital Sign    On average, how many days per week do you engage in moderate to strenuous exercise (like a brisk walk)?: 5 days    On average, how many minutes do you engage in exercise at this level?: 20 min  Stress: No Stress Concern Present (02/07/2020)   Received from [facility] of Occupational Health - Occupational Stress Questionnaire    Feeling of Stress : Not at all  Social Connections: Unknown (01/22/2024)   Social  Connection and Isolation Panel    Frequency of Communication with Friends and Family: Not on file    Frequency of Social Gatherings with Friends and Family: Not on file    Attends Religious Services: Not on file    Active Member of Clubs or Organizations: Patient declined    Attends Banker Meetings: Patient declined    Marital Status: Patient declined   History reviewed. No pertinent family history. Allergies  Allergen Reactions   Clindamycin Hives   Doxycycline  Rash   Penicillins Rash   Sulfamethoxazole Rash   Prior to Admission medications   Medication Sig Start Date End Date Taking? Authorizing Provider  acetaminophen  (TYLENOL ) 500 MG tablet Take 1,000 mg by mouth every 6 (six) hours as needed.   Yes [provider]  ascorbic acid  (VITAMIN C ) 500 MG tablet Take  500 mg by mouth Once Daily. 04/07/16  Yes [provider]  ciprofloxacin  (CIPRO ) 500 MG tablet Take 1 tablet (500 mg total) by mouth 2 (two) times daily. 05/15/22  Yes Kassie Ozell SAUNDERS, MD  Cyanocobalamin (VITAMIN B 12 PO) Take 1 tablet by mouth 3 (three) times a week.   Yes [provider]  docusate sodium  (COLACE) 50 MG capsule Take 100 mg by mouth 2 (two) times daily.   Yes [provider]  gabapentin  (NEURONTIN ) 800 MG tablet Take 1 tablet (800 mg total) by mouth 3 (three) times daily. Patient taking differently: Take 800 mg by mouth in the morning, at noon, in the evening, and at bedtime. 07/20/18  Yes Schulz, Zachary R, [name]  losartan  (COZAAR ) 100 MG tablet Take 100 mg by mouth daily with lunch.   Yes [provider]  methenamine  (HIPREX ) 1 g tablet Take 1 g by mouth 3 (three) times daily.    Yes [provider]  Multiple Vitamin (MULTIVITAMIN) tablet Take 1 tablet by mouth 3 (three) times a week.   Yes [provider]  Omega-3 Fatty Acids (FISH OIL PO) Take 1 tablet by mouth 3 (three) times a week.   Yes [provider]  oxyCODONE   (ROXICODONE ) 5 MG immediate release tablet Take 1 tablet (5 mg total) by mouth every 8 (eight) hours as needed for up to 12 doses. 01/21/24  Yes Waymond Lorelle Cummins, MD  PAIN MANAGEMENT INTRATHECAL, IT, PUMP 1 each by Intrathecal route continuous. Intrathecal (IT) medication:  Baclofen , Dilaudid    Yes [provider]  polyethylene glycol powder (GLYCOLAX /MIRALAX ) powder Take 17 g by mouth as needed. 08/09/10  Yes [provider]  Propylene Glycol 0.6 % SOLN Place 1 drop into both eyes daily as needed (dry eyes).   Yes [provider]  Trospium Chloride 60 MG CP24 Take 60 mg by mouth daily. 11/26/23  Yes [provider]  Multiple Vitamins-Minerals (ONE DAILY CALCIUM/IRON) TABS Take 1 tablet by mouth.    [provider]   Recent Labs    01/20/24 2311 01/22/24 1741 01/23/24 0534  WBC 7.1 7.1 6.8  HGB 14.5 13.0 13.2  HCT 43.0 37.9* 39.1  PLT 180 145* 149*  K 4.2 4.2  --   CL 103 100  --   CO2 27 28  --   BUN 23 19  --   CREATININE 0.76 0.52*  --   GLUCOSE 114* 114*  --   CALCIUM 9.1 8.8*  --    No results found.   Positive ROS: All other systems have been reviewed and were otherwise negative with the exception of those mentioned in the HPI and as above.  Physical Exam: BP 112/61 (BP Location: Right Arm)   Pulse 74   Temp 98.5 F (36.9 C)   Resp 17   Ht 6' 1 (1.854 m)   Wt 95.3 kg   SpO2 98%   BMI 27.71 kg/m  General:  Alert, no acute distress Psychiatric:  Patient is competent for consent with normal mood and affect    Orthopedic Exam:  RLE: +DF/PF/EHL SILT grossly over foot Foot wwp KI in place TTP about proximal tibia +effusion RoM not tested   Imaging:  As above: R mildly displaced medial tibial plateau fracture  Assessment/Plan: [name] is a 73 y.o. male with a R mildly displaced medial tibial plateau fracture Given the fracture pattern and patient's underlying paraplegia, we discussed both surgical management as  well as nonsurgical  treatment options.  We agreed to proceed with nonsurgical management.  This would initially consist of nonweightbearing on the right lower extremity with the knee in a knee immobilizer.  Knee immobilizer was appropriately placed. Continue with PT/OT Continue DVT prophylaxis. Will plan to follow peripherally while patient is admitted.  Patient may follow-up with [facility] orthopedics in 1-2 weeks with repeat radiographs.  Please page with any further questions.    Earnestine Blanch   01/23/2024 3:56 PM

## 2024-01-23 NOTE — Progress Notes (Signed)
 Progress Note   Patient: [name] FMW:[id number] DOB: 1951-05-15 DOA: 01/20/2024     1 DOS: the patient was seen and examined on 01/23/2024   Brief hospital course: [name] is a 73 y.o. 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  Patient injured his right knee.    Patient was seen by orthopedics who is not recommending surgery at this time but will follow-up as an outpatient.   Assessment and Plan:   Right tibiofibular fracture: - Status post fall. - Reviewed x-ray and CT angio of right lower extremity. Case discussed with Ortho Dr. Patel-recommended nonoperative management since patient is paraplegic and wheelchair-bound.  start DVT prophylaxis - Continue as needed pain medications. - PT on board recommend SNF.   Continue to monitor H&H TOC on board for placement need   Chronic pain syndrome Paraplegic: Lumbar spinal cord injury:  Continue gabapentin .   Followed by pain management.   Hypertension: Stable.  Continue losartan    Chronic constipation: Continue Colace and MiraLAX    Neurogenic bladder: Patient self caths-4-6 time per day   History of prostate cancer:aware     DVT prophylaxis: Lovenox   Code Status: Full code  Family Communication: None present at bedside.  Plan of care discussed with patient in length and he verbalized understanding and agreed with it.  Disposition Plan: SNF  Subjective:  Patient seen and examined at bedside this morning Denies nausea vomiting abdominal pain chest pain  Physical Exam:  Constitutional: NAD, calm, comfortable, on room air, communicating well Eyes: PERRL, lids and conjunctivae normal ENMT: Mucous membranes  are moist. Posterior pharynx clear of any exudate or lesions.Normal dentition.  Neck: normal, supple, no masses, no thyromegaly Respiratory: clear to auscultation bilaterally, no wheezing, no crackles. Normal respiratory effort. No accessory muscle use.  Cardiovascular: Regular rate and rhythm, no murmurs / rubs / gallops. No extremity edema. 2+ pedal pulses. No carotid bruits.  Abdomen: no tenderness, no masses palpated. No hepatosplenomegaly. Bowel sounds positive.  Musculoskeletal: Right leg: In knee immobilizer Skin: no rashes, lesions, ulcers. No induration Neurologic: Alert and oriented x 3.  Following commands.  Paraplegic.    Vitals:   01/22/24 1737 01/22/24 1940 01/23/24 0326 01/23/24 0847  BP: (!) 150/67 130/75 132/66 (!) 140/80  Pulse: 91 86 75 81  Resp: 17 20 16 17   Temp: 99.2 F (37.3 C) 98.3 F (36.8 C) 98.5 F (36.9 C) 98.8 F (37.1 C)  TempSrc:  Oral Oral   SpO2: 99% 95% 97% 97%  Weight:      Height:        Data Reviewed:    Latest Ref Rng & Units 01/23/2024    5:34 AM 01/22/2024    5:41 PM 01/20/2024   11:11 PM  CBC  WBC 4.0 - 10.5 K/uL 6.8  7.1  7.1   Hemoglobin 13.0 - 17.0 g/dL 86.7  86.9  85.4   Hematocrit 39.0 - 52.0 % 39.1  37.9  43.0   Platelets 150 - 400 K/uL 149  145  180        Latest Ref Rng & Units 01/22/2024    5:41 PM 01/20/2024   11:11 PM 05/12/2022    8:33 AM  BMP  Glucose 70 - 99 mg/dL  114  114  102   BUN 8 - 23 mg/dL 19  23  19    Creatinine 0.61 - 1.24 mg/dL 9.47  9.23  9.38   Sodium 135 - 145 mmol/L 138  139  140   Potassium 3.5 - 5.1 mmol/L 4.2  4.2  4.0   Chloride 98 - 111 mmol/L 100  103  104   CO2 22 - 32 mmol/L 28  27  28    Calcium 8.9 - 10.3 mg/dL 8.8  9.1  9.3      Time spent: 50 minutes  Author: Drue ONEIDA Potter, MD 01/23/2024 2:16 PM  For on call review [email or web address].

## 2024-01-24 DIAGNOSIS — S82101D Unspecified fracture of upper end of right tibia, subsequent encounter for closed fracture with routine healing: Secondary | ICD-10-CM | POA: Diagnosis not present

## 2024-01-24 LAB — BASIC METABOLIC PANEL WITH GFR
Anion gap: 11 (ref 5–15)
BUN: 20 mg/dL (ref 8–23)
CO2: 26 mmol/L (ref 22–32)
Calcium: 8.6 mg/dL — ABNORMAL LOW (ref 8.9–10.3)
Chloride: 98 mmol/L (ref 98–111)
Creatinine, Ser: 0.77 mg/dL (ref 0.61–1.24)
GFR, Estimated: >60 mL/min (ref >60)
Glucose, Bld: 109 mg/dL — ABNORMAL HIGH (ref 70–99)
Potassium: 3.5 mmol/L (ref 3.5–5.1)
Sodium: 135 mmol/L (ref 135–145)

## 2024-01-24 LAB — CBC WITH DIFFERENTIAL/PLATELET
Abs Immature Granulocytes: 0.02 K/uL (ref 0.00–0.07)
Basophils Absolute: 0 K/uL (ref 0.0–0.1)
Basophils Relative: 1 %
Eosinophils Absolute: 0.2 K/uL (ref 0.0–0.5)
Eosinophils Relative: 3 %
HCT: 36.9 % — ABNORMAL LOW (ref 39.0–52.0)
Hemoglobin: 12.6 g/dL — ABNORMAL LOW (ref 13.0–17.0)
Immature Granulocytes: 0 %
Lymphocytes Relative: 26 %
Lymphs Abs: 1.7 K/uL (ref 0.7–4.0)
MCH: 29.7 pg (ref 26.0–34.0)
MCHC: 34.1 g/dL (ref 30.0–36.0)
MCV: 87 fL (ref 80.0–100.0)
Monocytes Absolute: 0.8 K/uL (ref 0.1–1.0)
Monocytes Relative: 13 %
Neutro Abs: 3.6 K/uL (ref 1.7–7.7)
Neutrophils Relative %: 57 %
Platelets: 159 K/uL (ref 150–400)
RBC: 4.24 MIL/uL (ref 4.22–5.81)
RDW: 12.5 % (ref 11.5–15.5)
WBC: 6.4 K/uL (ref 4.0–10.5)
nRBC: 0 % (ref 0.0–0.2)

## 2024-01-24 NOTE — Progress Notes (Signed)
 Progress Note   Patient: [name] FMW:[id number] DOB: 05/15/1951 DOA: 01/20/2024     2 DOS: the patient was seen and examined on 01/24/2024     Brief hospital course: [name] is a 73 y.o. 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  Patient injured his right knee.    Patient was seen by orthopedics who is not recommending surgery at this time but will follow-up as an outpatient.     Assessment and Plan:    Right tibiofibular fracture: - Status post fall. - Reviewed x-ray and CT angio of right lower extremity. Case discussed with Ortho Dr. Patel-recommended nonoperative management since patient is paraplegic and wheelchair-bound.  start DVT prophylaxis Continue as needed pain medications. PT recommends skilled nursing facility Continue to monitor H&H TOC on board for placement need   Chronic pain syndrome Paraplegic: Lumbar spinal cord injury:  Continue gabapentin .   Followed by pain management.   Hypertension: Stable.  Continue losartan    Chronic constipation: Continue Colace and MiraLAX    Neurogenic bladder: Patient self caths-4-6 time per day   History of prostate cancer:aware     DVT prophylaxis: Lovenox    Code Status: Full code   Family Communication: None present at bedside.  Plan of care discussed with patient in length and he verbalized understanding and agreed with it.   Disposition Plan: SNF   Subjective:  Patient seen and examined at bedside this morning Patient denies nausea vomiting abdominal pain chest pain Currently pending rehab placement  Physical Exam:   Constitutional: NAD, calm, comfortable, on room air, communicating well Eyes:  PERRL, lids and conjunctivae normal ENMT: Mucous membranes are moist. Posterior pharynx clear of any exudate or lesions.Normal dentition.  Neck: normal, supple, no masses, no thyromegaly Respiratory: clear to auscultation bilaterally, no wheezing, no crackles. Normal respiratory effort. No accessory muscle use.  Cardiovascular: Regular rate and rhythm, no murmurs / rubs / gallops. No extremity edema. 2+ pedal pulses. No carotid bruits.  Abdomen: no tenderness, no masses palpated. No hepatosplenomegaly. Bowel sounds positive.  Musculoskeletal: Right leg: In knee immobilizer Skin: no rashes, lesions, ulcers. No induration Neurologic: Alert and oriented x 3.  Following commands.  Paraplegic.    Data Reviewed:    Latest Ref Rng & Units 01/24/2024    5:35 AM 01/23/2024    5:34 AM 01/22/2024    5:41 PM  CBC  WBC 4.0 - 10.5 K/uL 6.4  6.8  7.1   Hemoglobin 13.0 - 17.0 g/dL 87.3  86.7  86.9   Hematocrit 39.0 - 52.0 % 36.9  39.1  37.9   Platelets 150 - 400 K/uL 159  149  145        Latest Ref Rng & Units 01/24/2024    5:35 AM 01/22/2024    5:41 PM 01/20/2024   11:11 PM  BMP  Glucose 70 - 99 mg/dL 890  885  885   BUN 8 - 23 mg/dL 20  19  23    Creatinine 0.61 - 1.24 mg/dL 9.22  9.47  9.23   Sodium 135 - 145 mmol/L 135  138  139   Potassium 3.5 - 5.1 mmol/L 3.5  4.2  4.2   Chloride 98 - 111 mmol/L 98  100  103   CO2 22 - 32 mmol/L 26  28  27    Calcium 8.9 - 10.3 mg/dL 8.6  8.8  9.1     Vitals:   01/23/24 1957 01/24/24 0349 01/24/24 0807 01/24/24 1554  BP: 107/63 (!) 134/115 111/72 (!) 108/59  Pulse: 83 74 78 81  Resp: 18 18 17 18   Temp: 98.4 F (36.9 C) 97.6 F (36.4 C) 98.2 F (36.8 C) 98 F (36.7 C)  TempSrc: Oral Oral    SpO2: 98% 96% 97% 98%  Weight:      Height:        Time spent: 50 minutes  Author: Drue ONEIDA Potter, MD 01/24/2024 4:33 PM  For on call review [email or web address].

## 2024-01-24 NOTE — Progress Notes (Signed)
 Physical Therapy Treatment Patient Details Name: [name] MRN: [id number] DOB: May 08, 1951 Today's Date: 01/24/2024   History of Present Illness [name] is a 73 y.o. male with history of hypertension, chronic pain disorder, neuromuscular disorder, chronic left dropfoot, presenting with right knee pain after fall.  Patient states that he was trying to transfer into his wheelchair, the wheels were not locked, he fell and landed on his right knee.  States that he heard a pop, unsure if he felt it dislocate.    PT Comments  Pt ready to return to bed.  +1 min assist sliding board transfer. Overall does quite well with set up and holding board to go up hill.  Positioned for comfort.     If plan is discharge home, recommend the following: A lot of help with bathing/dressing/bathroom;Two people to help with walking and/or transfers;Help with stairs or ramp for entrance;Assistance with cooking/housework   Can travel by private vehicle     No  Equipment Recommendations  Wheelchair (measurements PT);Wheelchair cushion (measurements PT);Hospital bed;Other (comment);BSC/3in1    Recommendations for Other Services       Precautions / Restrictions Precautions Precautions: Fall Precaution/Restrictions Comments: KI donned. NWB per ortho note.  Plan for ortho follow up outpatient, chronic BLE paraplegia/weakness, self cath, Required Braces or Orthoses: Knee Immobilizer - Right Restrictions Weight Bearing Restrictions Per Provider Order: Yes Other Position/Activity Restrictions: NWB RLE     Mobility  Bed Mobility Overal bed mobility: Needs Assistance Bed Mobility: Sit to Supine Rolling: Min assist, Used rails   Supine to sit: HOB elevated, Used rails, Min assist, Mod assist Sit to supine: Used rails, Min assist     Patient Response: Cooperative  Transfers Overall transfer level: Needs assistance Equipment used: Sliding board Transfers: Bed to chair/wheelchair/BSC             Lateral/Scoot Transfers: Min assist General transfer comment: does quite well.    Ambulation/Gait               General Gait Details: unable at baseline   Stairs             Wheelchair Mobility     Tilt Bed Tilt Bed Patient Response: Cooperative  Modified Rankin (Stroke Patients Only)       Balance Overall balance assessment: Needs assistance Sitting-balance support: Feet supported, Bilateral upper extremity supported Sitting balance-Leahy Scale: Good Sitting balance - Comments: no LOB with sitting static or dynamic during transfer     Standing balance-Leahy Scale: Zero                              Communication Communication Communication: No apparent difficulties  Cognition Arousal: Alert Behavior During Therapy: WFL for tasks assessed/performed   PT - Cognitive impairments: No apparent impairments                       PT - Cognition Comments: Pt is A and O x4 Following commands: Intact      Cueing Cueing Techniques: Verbal cues, Tactile cues  Exercises      General Comments        Pertinent Vitals/Pain Pain Assessment Pain Assessment: No/denies pain Faces Pain Scale: Hurts little more Pain Location: R knee Pain Descriptors / Indicators: Discomfort, Guarding, Grimacing Pain Intervention(s): Limited activity within patient's tolerance, Monitored during session, Repositioned    Home Living  Prior Function            PT Goals (current goals can now be found in the care plan section) Progress towards PT goals: Progressing toward goals    Frequency    Min 3X/week      PT Plan      Co-evaluation              AM-PAC PT 6 Clicks Mobility   Outcome Measure  Help needed turning from your back to your side while in a flat bed without using bedrails?: A Little Help needed moving from lying on your back to sitting on the side of a flat bed without using  bedrails?: A Little Help needed moving to and from a bed to a chair (including a wheelchair)?: A Little Help needed standing up from a chair using your arms (e.g., wheelchair or bedside chair)?: A Little Help needed to walk in hospital room?: Total Help needed climbing 3-5 steps with a railing? : Total 6 Click Score: 14    End of Session Equipment Utilized During Treatment: Right knee immobilizer;Gait belt Activity Tolerance: Patient tolerated treatment well;Patient limited by fatigue Patient left: in chair;with call bell/phone within reach;with nursing/sitter in room Nurse Communication: Mobility status PT Visit Diagnosis: Other abnormalities of gait and mobility (R26.89);Muscle weakness (generalized) (M62.81);Pain Pain - Right/Left: Right Pain - part of body: Knee     Time: 8469-8461 PT Time Calculation (min) (ACUTE ONLY): 8 min  Charges:    $Therapeutic Activity: 8-22 mins PT General Charges $$ ACUTE PT VISIT: 1 Visit                   Lauraine Gills, PTA 01/24/24, 3:48 PM

## 2024-01-24 NOTE — Progress Notes (Signed)
 Physical Therapy Treatment Patient Details Name: ONDRE SALVETTI MRN: [id number] DOB: 05/06/1951 Today's Date: 01/24/2024   History of Present Illness LINKON SIVERSON is a 73 y.o. male with history of hypertension, chronic pain disorder, neuromuscular disorder, chronic left dropfoot, presenting with right knee pain after fall.  Patient states that he was trying to transfer into his wheelchair, the wheels were not locked, he fell and landed on his right knee.  States that he heard a pop, unsure if he felt it dislocate.    PT Comments  Assisted pt with transfer to recliner per his/nursing request.  Pt is inc small loose BM and care is given.  Used rails and min a for rolling for care.  He transitions from L side lying to sitting with min/mod a x 1.  Steady in sitting.  He is able to lateral scoot to recliner with set up and +2 supervision.  Overall does quite well with transfer today.  Remained in recliner with needs met.    If plan is discharge home, recommend the following: A lot of help with bathing/dressing/bathroom;Two people to help with walking and/or transfers;Help with stairs or ramp for entrance;Assistance with cooking/housework   Can travel by Doctor, hospital (measurements PT);Wheelchair cushion (measurements PT);Hospital bed;Other (comment);BSC/3in1    Recommendations for Other Services       Precautions / Restrictions Precautions Precautions: Fall Precaution/Restrictions Comments: KI donned. NWB per ortho note.  Plan for ortho follow up outpatient, chronic BLE paraplegia/weakness, self cath, Required Braces or Orthoses: Knee Immobilizer - Right Restrictions Weight Bearing Restrictions Per Provider Order: Yes Other Position/Activity Restrictions: NWB RLE     Mobility  Bed Mobility Overal bed mobility: Needs Assistance Bed Mobility: Rolling, Supine to Sit Rolling: Min assist, Used rails   Supine to sit: HOB elevated, Used  rails, Min assist, Mod assist       Patient Response: Cooperative  Transfers Overall transfer level: Needs assistance Equipment used: Sliding board Transfers: Bed to chair/wheelchair/BSC            Lateral/Scoot Transfers: Min assist, +2 safety/equipment, With slide board General transfer comment: +2 for safety but only minimal assist needed +1    Ambulation/Gait               General Gait Details: unable at baseline   Stairs             Wheelchair Mobility     Tilt Bed Tilt Bed Patient Response: Cooperative  Modified Rankin (Stroke Patients Only)       Balance Overall balance assessment: Needs assistance Sitting-balance support: Feet supported, Bilateral upper extremity supported Sitting balance-Leahy Scale: Good Sitting balance - Comments: no LOB with sitting static or dynamic during transfer     Standing balance-Leahy Scale: Zero                              Communication Communication Communication: No apparent difficulties  Cognition Arousal: Alert Behavior During Therapy: WFL for tasks assessed/performed   PT - Cognitive impairments: No apparent impairments                         Following commands: Intact      Cueing Cueing Techniques: Verbal cues, Tactile cues  Exercises      General Comments        Pertinent Vitals/Pain Pain Assessment Pain  Assessment: Faces Faces Pain Scale: Hurts little more Pain Location: R knee Pain Descriptors / Indicators: Discomfort, Guarding, Grimacing Pain Intervention(s): Limited activity within patient's tolerance, Monitored during session, Repositioned    Home Living                          Prior Function            PT Goals (current goals can now be found in the care plan section) Progress towards PT goals: Progressing toward goals    Frequency    Min 3X/week      PT Plan      Co-evaluation              AM-PAC PT 6 Clicks  Mobility   Outcome Measure  Help needed turning from your back to your side while in a flat bed without using bedrails?: A Lot Help needed moving from lying on your back to sitting on the side of a flat bed without using bedrails?: A Lot Help needed moving to and from a bed to a chair (including a wheelchair)?: A Little Help needed standing up from a chair using your arms (e.g., wheelchair or bedside chair)?: Total Help needed to walk in hospital room?: Total Help needed climbing 3-5 steps with a railing? : Total 6 Click Score: 10    End of Session Equipment Utilized During Treatment: Right knee immobilizer;Gait belt Activity Tolerance: Patient tolerated treatment well;Patient limited by fatigue Patient left: in chair;with call bell/phone within reach;with nursing/sitter in room Nurse Communication: Mobility status PT Visit Diagnosis: Other abnormalities of gait and mobility (R26.89);Muscle weakness (generalized) (M62.81);Pain Pain - Right/Left: Right Pain - part of body: Knee     Time: 1029-1040 PT Time Calculation (min) (ACUTE ONLY): 11 min  Charges:    $Therapeutic Activity: 8-22 mins PT General Charges $$ ACUTE PT VISIT: 1 Visit                    Lauraine Gills, PTA 01/24/24, 11:48 AM

## 2024-01-24 NOTE — Plan of Care (Signed)

## 2024-01-25 DIAGNOSIS — S8991XD Unspecified injury of right lower leg, subsequent encounter: Secondary | ICD-10-CM

## 2024-01-25 LAB — BASIC METABOLIC PANEL WITH GFR
Anion gap: 7 (ref 5–15)
BUN: 18 mg/dL (ref 8–23)
CO2: 28 mmol/L (ref 22–32)
Calcium: 8.7 mg/dL — ABNORMAL LOW (ref 8.9–10.3)
Chloride: 104 mmol/L (ref 98–111)
Creatinine, Ser: 0.62 mg/dL (ref 0.61–1.24)
GFR, Estimated: >60 mL/min (ref >60)
Glucose, Bld: 106 mg/dL — ABNORMAL HIGH (ref 70–99)
Potassium: 3.5 mmol/L (ref 3.5–5.1)
Sodium: 139 mmol/L (ref 135–145)

## 2024-01-25 LAB — CBC WITH DIFFERENTIAL/PLATELET
Abs Immature Granulocytes: 0.02 K/uL (ref 0.00–0.07)
Basophils Absolute: 0 K/uL (ref 0.0–0.1)
Basophils Relative: 1 %
Eosinophils Absolute: 0.2 K/uL (ref 0.0–0.5)
Eosinophils Relative: 4 %
HCT: 35.4 % — ABNORMAL LOW (ref 39.0–52.0)
Hemoglobin: 12.1 g/dL — ABNORMAL LOW (ref 13.0–17.0)
Immature Granulocytes: 0 %
Lymphocytes Relative: 26 %
Lymphs Abs: 1.4 K/uL (ref 0.7–4.0)
MCH: 29.9 pg (ref 26.0–34.0)
MCHC: 34.2 g/dL (ref 30.0–36.0)
MCV: 87.4 fL (ref 80.0–100.0)
Monocytes Absolute: 0.8 K/uL (ref 0.1–1.0)
Monocytes Relative: 14 %
Neutro Abs: 2.9 K/uL (ref 1.7–7.7)
Neutrophils Relative %: 55 %
Platelets: 155 K/uL (ref 150–400)
RBC: 4.05 MIL/uL — ABNORMAL LOW (ref 4.22–5.81)
RDW: 12.4 % (ref 11.5–15.5)
WBC: 5.4 K/uL (ref 4.0–10.5)
nRBC: 0 % (ref 0.0–0.2)

## 2024-01-25 NOTE — Progress Notes (Signed)
 Progress Note   Patient: [name] FMW:[id number] DOB: 17-Sep-1950 DOA: 01/20/2024     3 DOS: the patient was seen and examined on 01/25/2024     Brief hospital course: LYNKIN SAINI is a 73 y.o. 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  Patient injured his right knee.    Patient was seen by orthopedics who is not recommending surgery at this time but will follow-up as an outpatient.     Assessment and Plan:    Right tibiofibular fracture: - Status post fall. - Reviewed x-ray and CT angio of right lower extremity. Case discussed with Ortho Dr. Patel-recommended nonoperative management since patient is paraplegic and wheelchair-bound.  Continue as needed pain medications. PT recommends skilled nursing facility Continue to monitor H&H TOC on board for placement need   Chronic pain syndrome Paraplegic: Lumbar spinal cord injury:  Continue gabapentin .   Followed by pain management.   Hypertension: Stable.  Continue losartan    Chronic constipation: Continue Colace and MiraLAX    Neurogenic bladder: Patient self caths-4-6 time per day   History of prostate cancer:aware     DVT prophylaxis: Lovenox    Code Status: Full code   Family Communication: None present at bedside.  Plan of care discussed with patient in length and he verbalized understanding and agreed with it.    Disposition Plan: SNF   Subjective:  Patient seen and examined at bedside this morning Denies nausea vomiting abdominal pain chest pain cough According to [facility] they are working on placement and authorization  Physical Exam:   Constitutional: NAD, calm, comfortable, on room air, communicating  well Eyes: PERRL, lids and conjunctivae normal ENMT: Mucous membranes are moist. Posterior pharynx clear of any exudate or lesions.Normal dentition.  Neck: normal, supple, no masses, no thyromegaly Respiratory: clear to auscultation bilaterally, no wheezing, no crackles. Normal respiratory effort. No accessory muscle use.  Cardiovascular: Regular rate and rhythm, no murmurs / rubs / gallops. No extremity edema. 2+ pedal pulses. No carotid bruits.  Abdomen: no tenderness, no masses palpated. No hepatosplenomegaly. Bowel sounds positive.  Musculoskeletal: Right leg: In knee immobilizer Skin: no rashes, lesions, ulcers. No induration Neurologic: Alert and oriented x 3.  Following commands.  Paraplegic.     Data Reviewed:    Vitals:   01/25/24 0456 01/25/24 0759 01/25/24 1142 01/25/24 1557  BP: 123/86 119/74 107/62 (!) 108/58  Pulse: 89 (!) 57 92 84  Resp: 16 14  17   Temp: (!) 97.4 F (36.3 C) 98.4 F (36.9 C)  98.3 F (36.8 C)  TempSrc:      SpO2:  97%  99%  Weight:      Height:          Latest Ref Rng & Units 01/25/2024    3:38 AM 01/24/2024    5:35 AM 01/23/2024    5:34 AM  CBC  WBC 4.0 - 10.5 K/uL 5.4  6.4  6.8   Hemoglobin 13.0 - 17.0 g/dL 87.8  87.3  86.7   Hematocrit 39.0 - 52.0 % 35.4  36.9  39.1   Platelets 150 - 400 K/uL 155  159  149        Latest Ref Rng & Units 01/25/2024    3:38 AM 01/24/2024  5:35 AM 01/22/2024    5:41 PM  BMP  Glucose 70 - 99 mg/dL 893  890  885   BUN 8 - 23 mg/dL 18  20  19    Creatinine 0.61 - 1.24 mg/dL 9.37  9.22  9.47   Sodium 135 - 145 mmol/L 139  135  138   Potassium 3.5 - 5.1 mmol/L 3.5  3.5  4.2   Chloride 98 - 111 mmol/L 104  98  100   CO2 22 - 32 mmol/L 28  26  28    Calcium 8.9 - 10.3 mg/dL 8.7  8.6  8.8        Author: Drue ONEIDA Potter, MD 01/25/2024 4:36 PM  For on call review [email or web address].

## 2024-01-25 NOTE — Progress Notes (Signed)
 Occupational Therapy Treatment Patient Details Name: [name] MRN: [id number] DOB: [birth year] Today's Date: 01/25/2024   History of present illness Pt is a 73 y.o. male with PMH significant for HTN, chronic pain syndrome, s/p lumbar spinal cord injury wheelchair-bound, paraplegia, prostate cancer, obesity, decubitus ulcer of right ischial area neurogenic bladder presented to ER on 01/20/2024 after a fall at home while he was trying to transfer from toilet back to his wheelchair. Found to have R tibiofibular fx; ortho recommends non-surgical with NWB + KI.   OT comments  Pt seen for OT tx this date. Pt making progress towards goals, but is not at his functional baseline. Requires MIN A for bed mobility, and MIN A with multimodal cuing for lateral SB transfer from bed to recliner. Pt is able to self-cath with setup while seated in recliner. KI adjusted for optimal fit prior to mobility. Discharge recommendation remains appropriate, OT will continue to follow. Patient will benefit from continued inpatient follow up therapy, <3 hours/day       If plan is discharge home, recommend the following:  A lot of help with bathing/dressing/bathroom;Assistance with cooking/housework;Two people to help with walking and/or transfers;Help with stairs or ramp for entrance;Assist for transportation   Equipment Recommendations  None recommended by OT       Precautions / Restrictions Precautions Precautions: Fall Precaution/Restrictions Comments: KI donned. NWB per ortho note.  Plan for ortho follow up outpatient, chronic BLE paraplegia/weakness, self cath, Required Braces or Orthoses: Knee Immobilizer - Right Restrictions Weight Bearing Restrictions Per Provider Order: Yes RLE Weight Bearing Per Provider Order: Non weight bearing Other Position/Activity Restrictions: NWB RLE       Mobility Bed Mobility Overal bed mobility: Needs Assistance Bed Mobility: Sit to Supine     Supine to sit: HOB  elevated, Used rails, Min assist     General bed mobility comments: requires minA at RLE throughout, support at trunk intermittently, and use of bed pads to scoot hips forward    Transfers Overall transfer level: Needs assistance Equipment used: Sliding board Transfers: Bed to chair/wheelchair/BSC            Lateral/Scoot Transfers: Min assist, With slide board General transfer comment: OT sets up environment for lateral SB transfer from bed > recliner. Pt does benefit from cues for safety and sequencing of transfer technique. Once setup, pt is able to transfer with MIN A and mod vcs     Balance Overall balance assessment: Needs assistance Sitting-balance support: Feet supported, Bilateral upper extremity supported Sitting balance-Leahy Scale: Good Sitting balance - Comments: no LOB with sitting static or dynamic during transfer       Standing balance comment: NT, does not stand baseline                           ADL either performed or assessed with clinical judgement   ADL Overall ADL's : Needs assistance/impaired                             Toileting- Clothing Manipulation and Hygiene: Set up;Sitting/lateral lean Toileting - Clothing Manipulation Details (indicate cue type and reason): pt self-caths at baseline, performed in recliner after transfer     Functional mobility during ADLs: Minimal assistance;Cueing for safety;Cueing for sequencing       Communication Communication Communication: No apparent difficulties   Cognition Arousal: Alert Behavior During Therapy: [facility] for tasks assessed/performed  Attention impairment (select first level of impairment): Sustained attention Executive functioning impairment (select all impairments): Problem solving                            Cueing   Cueing Techniques: Verbal cues, Tactile cues             Pertinent Vitals/ Pain       Pain Assessment Pain Assessment:  No/denies pain   Frequency  Min 2X/week        Progress Toward Goals  OT Goals(current goals can now be found in the care plan section)  Progress towards OT goals: Progressing toward goals  Acute Rehab OT Goals OT Goal Formulation: With patient/family Time For Goal Achievement: 02/04/24 Potential to Achieve Goals: Fair  Plan         AM-PAC OT 6 Clicks Daily Activity     Outcome Measure   Help from another person eating meals?: None Help from another person taking care of personal grooming?: None Help from another person toileting, which includes using toliet, bedpan, or urinal?: A Lot Help from another person bathing (including washing, rinsing, drying)?: A Lot Help from another person to put on and taking off regular upper body clothing?: A Little Help from another person to put on and taking off regular lower body clothing?: A Lot 6 Click Score: 17    End of Session Equipment Utilized During Treatment: Gait belt;Right knee immobilizer (SB)  OT Visit Diagnosis: Muscle weakness (generalized) (M62.81)   Activity Tolerance Patient tolerated treatment well   Patient Left in chair;with call bell/phone within reach   Nurse Communication Mobility status        Time: 9085-9065 OT Time Calculation (min): 20 min  Charges: OT General Charges $OT Visit: 1 Visit OT Treatments $Therapeutic Activity: 8-22 mins Rajon Bisig L. Emmalena Canny, OTR/L  01/25/24, 1:08 PM

## 2024-01-25 NOTE — Care Management Important Message (Deleted)
 Important Message  Patient Details  Name: [name] MRN: [id number] Date of Birth: [birth year]   Important Message Given:  Yes - Medicare IM     Cindy Brindisi W, CMA 01/25/2024, 10:37 AM

## 2024-01-25 NOTE — [place] FL2 (Signed)
 [place]  MEDICAID FL2 LEVEL OF CARE FORM     IDENTIFICATION  Patient Name: [name] Birthdate: 1951-03-01 Sex: male Admission Date (Current Location): 01/20/2024  [facility] and [place] Number:  Chiropodist and Address:  [facility], [address] [place], [place] [address]      Provider Number: [id number]  Attending Physician Name and Address:  Dorinda Drue DASEN, MD  Relative Name and Phone Number:       Current Level of Care: Hospital Recommended Level of Care:   Prior Approval Number:    Date Approved/Denied:   PASRR Number: [id number]  Discharge Plan: SNF    Current Diagnoses: Patient Active Problem List   Diagnosis Date Noted   Right tibial fracture 01/22/2024   Basal cell carcinoma (BCC) of cheek 10/27/2019   Impaired mobility and endurance 12/21/2018   Pressure injury of sacral region, stage 4 (HCC) 08/04/2018   Decubitus ulcer of buttock, stage 4 (HCC) 07/09/2018   Sepsis (HCC) 05/20/2018   Pressure injury of skin 05/20/2018   Hypertension 02/05/2017   Paraparesis of both lower limbs (HCC) 01/19/2017   Prostate cancer (HCC) 01/19/2017   Essential hypertension 04/07/2016   Carpal tunnel syndrome, bilateral 06/29/2014   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 12/25/2012   Anorectal pain 07/20/2012   Pain syndrome, chronic 07/20/2012   Other muscle spasm 07/20/2012   Lumbar spinal cord injury (HCC) 06/29/2012   Neurogenic bladder 06/29/2012   Pain in joint, pelvic region and thigh 06/29/2012   Muscle spasticity 06/29/2012   Neurogenic bowel 06/29/2012    Orientation RESPIRATION BLADDER Height & Weight     Self, Time, Situation, Place  Normal   Weight: 95.3 kg Height:  6' 1 (185.4 cm)  BEHAVIORAL SYMPTOMS/MOOD NEUROLOGICAL BOWEL NUTRITION STATUS        Diet  AMBULATORY STATUS COMMUNICATION OF NEEDS Skin   Limited Assist Verbally Normal                       Personal Care Assistance Level of  Assistance              Functional Limitations Info             SPECIAL CARE FACTORS FREQUENCY  PT (By licensed PT), OT (By licensed OT)     PT Frequency: 5x a wk OT Frequency: 5x a wk            Contractures Contractures Info: Not present    Additional Factors Info  Code Status Code Status Info: Full             Current Medications (01/25/2024):  This is the current hospital active medication list Current Facility-Administered Medications  Medication Dose Route Frequency Provider Last Rate Last Admin   acetaminophen  (TYLENOL ) tablet 650 mg  650 mg Oral Q4H PRN Tan, Ting Xu, MD   650 mg at 01/23/24 1042   docusate sodium  (COLACE) capsule 100 mg  100 mg Oral BID Pahwani, Rinka R, MD   100 mg at 01/22/24 2117   enoxaparin  (LOVENOX ) injection 40 mg  40 mg Subcutaneous Q24H Pahwani, Rinka R, MD   40 mg at 01/25/24 1653   gabapentin  (NEURONTIN ) capsule 800 mg  800 mg Oral TID Tan, Ting Xu, MD   800 mg at 01/25/24 1653   losartan  (COZAAR ) tablet 100 mg  100 mg Oral Q lunch Pahwani, Rinka R, MD   100 mg at 01/24/24 1352   methenamine  (MANDELAMINE)  tablet 1,000 mg  1,000 mg Oral TID Tan, Ting Xu, MD   1,000 mg at 01/25/24 1654   morphine  (PF) 2 MG/ML injection 2 mg  2 mg Intravenous Q2H PRN Pahwani, Rinka R, MD       ondansetron  (ZOFRAN ) tablet 4 mg  4 mg Oral Q6H PRN Pahwani, Rinka R, MD       Or   ondansetron  (ZOFRAN ) injection 4 mg  4 mg Intravenous Q6H PRN Pahwani, Rinka R, MD       oxyCODONE  (Oxy IR/ROXICODONE ) immediate release tablet 5 mg  5 mg Oral Q6H PRN Tan, Ting Xu, MD   5 mg at 01/21/24 9371   polyethylene glycol powder (GLYCOLAX /MIRALAX ) container 17 g  17 g Oral PRN Pahwani, Rinka R, MD         Discharge Medications: Please see discharge summary for a list of discharge medications.  Relevant Imaging Results:  Relevant Lab Results:   Additional Information ss: [id number]  Marinda Cooks, RN

## 2024-01-25 NOTE — Plan of Care (Signed)
  Problem: Clinical Measurements: Goal: Will remain free from infection Outcome: Progressing Goal: Diagnostic test results will improve Outcome: Progressing   Problem: Activity: Goal: Risk for activity intolerance will decrease Outcome: Progressing   Problem: Nutrition: Goal: Adequate nutrition will be maintained Outcome: Progressing

## 2024-01-25 NOTE — Care Management Important Message (Signed)
 Important Message  Patient Details  Name: [name] MRN: [id number] Date of Birth: [birth year]   Important Message Given:  No     BRANDY CHRISTIANE ORN, CMA 01/25/2024, 10:39 AM

## 2024-01-25 NOTE — TOC Progression Note (Signed)
 Transition of Care [facility]) - Progression Note    Patient Details  Name: [name] MRN: [id number] Date of Birth: Jan 07, 1951  Transition of [facility]) CM/SW Contact  Marinda Cooks, RN Phone Number: 01/25/2024, 5:00 PM  Clinical Narrative:    This CM spoke with pt at bedside introduced role and discussed dc plan. Pt confirmed he was in agreement with dc to SNF for rehab. Pt denied having a preference for a facility and was open with referrals being sent based on his zip code. This CM sent referrals FL2 completed awaiting bed offers to provide pt for selection. TOC will cont to follow dc planning / care coordination and update as applicable.    Barriers to Discharge: Continued Medical Work up  Expected Discharge Plan and Services In-house Referral: Clinical Social Work     Living arrangements for the past 2 months: Single Family Home                   DME Agency: Other - Comment (pt currently receiving HH services from [facility])                   Social Determinants of Health (SDOH) Interventions SDOH Screenings   Food Insecurity: No Food Insecurity (01/22/2024)  Housing: Unknown (01/22/2024)  Transportation Needs: No Transportation Needs (01/22/2024)  Utilities: Not At Risk (01/22/2024)  Financial Resource Strain: Low Risk  (11/03/2023)   Received from [facility]  Physical Activity: Insufficiently Active (02/07/2020)   Received from [facility]  Social Connections: Unknown (01/22/2024)  Stress: No Stress Concern Present (02/07/2020)   Received from [facility]  Tobacco Use: Medium Risk (01/20/2024)  Health Literacy: Medium Risk (02/07/2020)   [facility]    Readmission Risk Interventions     No data to display

## 2024-01-25 NOTE — Progress Notes (Signed)
 Physical Therapy Treatment Patient Details Name: [name] MRN: [id number] DOB: November 18, 1950 Today's Date: 01/25/2024   History of Present Illness [name] is a 73 y.o. male with history of hypertension, chronic pain disorder, neuromuscular disorder, chronic left dropfoot, presenting with right knee pain after fall.  Patient states that he was trying to transfer into his wheelchair, the wheels were not locked, he fell and landed on his right knee.  States that he heard a pop, unsure if he felt it dislocate.    PT Comments  Pt was long sitting in recliner upon arrival. He is A and O x 4. Eager to attempt standing but also realistic in his abilities. Did try to stand 1 x to RW but unsafe. Pt performed slide board transfer from recliner to EOB with min-mod assist of one. Vcs for sequencing and technique improvements. Once back in bed, reviewed exercises to promote strength and return in abilities. Pt demonstrated understanding. Will review in future sessions. DC recs remain appropriate to maximize his independence while assisting pt to PLOF. Pt remains far from his baseline and will benefit from continued PT.    If plan is discharge home, recommend the following: A lot of help with bathing/dressing/bathroom;Two people to help with walking and/or transfers;Help with stairs or ramp for entrance;Assistance with Systems developer (measurements PT);Wheelchair cushion (measurements PT);Hospital bed;Other (comment);BSC/3in1 (slideboard)       Precautions / Restrictions Precautions Precautions: Fall Precaution/Restrictions Comments: KI donned. NWB per ortho note.  Plan for ortho follow up outpatient, chronic BLE paraplegia/weakness, self cath, Required Braces or Orthoses: Knee Immobilizer - Right Restrictions Weight Bearing Restrictions Per Provider Order: Yes Other Position/Activity Restrictions: NWB RLE     Mobility  Bed Mobility Overal bed  mobility: Needs Assistance Bed Mobility: Sit to Supine Rolling: Min assist, Used rails  General bed mobility comments: min assist to progress BLEs into bed form EOB short sitting. min assist also required to reposition to [facility] form    Transfers Overall transfer level: Needs assistance Equipment used: Sliding board Transfers: Bed to chair/wheelchair/BSC   Lateral/Scoot Transfers: Min assist, Mod assist, Max assist, With slide board, From elevated surface General transfer comment: pt required min-mod assist of one to perform slideboard transfer from recliner to EOB. Vc/tcs throughout    Ambulation/Gait  General Gait Details: unable at baseline    Balance Overall balance assessment: Needs assistance Sitting-balance support: Feet supported, Bilateral upper extremity supported Sitting balance-Leahy Scale: Fair Sitting balance - Comments: pt was able to sit EOB with BUE/LLE support x ~ 5 minutes with supervision only       Standing balance comment: unable      Communication Communication Communication: No apparent difficulties  Cognition Arousal: Alert Behavior During Therapy: Flat affect, WFL for tasks assessed/performed   PT - Cognitive impairments: No apparent impairments    PT - Cognition Comments: Pt is A and O x4 Following commands: Intact      Cueing Cueing Techniques: Verbal cues, Tactile cues     General Comments General comments (skin integrity, edema, etc.): reviewed importance of ther ex while in bed and in recliner to promote return in strength/ transfer abilities      Pertinent Vitals/Pain Pain Assessment Pain Assessment: 0-10 Pain Score: 4  Pain Location: R knee Pain Descriptors / Indicators: Discomfort, Guarding, Grimacing Pain Intervention(s): Limited activity within patient's tolerance, Monitored during session, Premedicated before session, Repositioned     PT Goals (current goals can now  be found in the care plan section) Acute Rehab PT  Goals Patient Stated Goal: be independent again Progress towards PT goals: Progressing toward goals    Frequency    Min 3X/week       AM-PAC PT 6 Clicks Mobility   Outcome Measure  Help needed turning from your back to your side while in a flat bed without using bedrails?: A Lot Help needed moving from lying on your back to sitting on the side of a flat bed without using bedrails?: A Lot Help needed moving to and from a bed to a chair (including a wheelchair)?: A Lot Help needed standing up from a chair using your arms (e.g., wheelchair or bedside chair)?: Total Help needed to walk in hospital room?: Total Help needed climbing 3-5 steps with a railing? : Total 6 Click Score: 9    End of Session Equipment Utilized During Treatment: Right knee immobilizer Activity Tolerance: Patient tolerated treatment well;Patient limited by fatigue (limited by BM) Patient left: in bed;with call bell/phone within reach;with bed alarm set;with family/visitor present;with nursing/sitter in room (daughter/ grandson present) Nurse Communication: Mobility status PT Visit Diagnosis: Other abnormalities of gait and mobility (R26.89);Muscle weakness (generalized) (M62.81);Pain Pain - Right/Left: Right Pain - part of body: Knee     Time: 8641-8585 PT Time Calculation (min) (ACUTE ONLY): 16 min  Charges:    $Therapeutic Activity: 8-22 mins PT General Charges $$ ACUTE PT VISIT: 1 Visit                     Rankin Essex PTA 01/25/24, 5:16 PM

## 2024-01-26 DIAGNOSIS — S8991XD Unspecified injury of right lower leg, subsequent encounter: Secondary | ICD-10-CM | POA: Diagnosis not present

## 2024-01-26 LAB — CBC WITH DIFFERENTIAL/PLATELET
Abs Immature Granulocytes: 0.02 K/uL (ref 0.00–0.07)
Basophils Absolute: 0 K/uL (ref 0.0–0.1)
Basophils Relative: 1 %
Eosinophils Absolute: 0.2 K/uL (ref 0.0–0.5)
Eosinophils Relative: 5 %
HCT: 34.4 % — ABNORMAL LOW (ref 39.0–52.0)
Hemoglobin: 11.7 g/dL — ABNORMAL LOW (ref 13.0–17.0)
Immature Granulocytes: 0 %
Lymphocytes Relative: 27 %
Lymphs Abs: 1.4 K/uL (ref 0.7–4.0)
MCH: 30 pg (ref 26.0–34.0)
MCHC: 34 g/dL (ref 30.0–36.0)
MCV: 88.2 fL (ref 80.0–100.0)
Monocytes Absolute: 0.7 K/uL (ref 0.1–1.0)
Monocytes Relative: 14 %
Neutro Abs: 2.6 K/uL (ref 1.7–7.7)
Neutrophils Relative %: 53 %
Platelets: 160 K/uL (ref 150–400)
RBC: 3.9 MIL/uL — ABNORMAL LOW (ref 4.22–5.81)
RDW: 12.5 % (ref 11.5–15.5)
WBC: 4.9 K/uL (ref 4.0–10.5)
nRBC: 0 % (ref 0.0–0.2)

## 2024-01-26 LAB — BASIC METABOLIC PANEL WITH GFR
Anion gap: 9 (ref 5–15)
BUN: 17 mg/dL (ref 8–23)
CO2: 28 mmol/L (ref 22–32)
Calcium: 8.6 mg/dL — ABNORMAL LOW (ref 8.9–10.3)
Chloride: 103 mmol/L (ref 98–111)
Creatinine, Ser: 0.56 mg/dL — ABNORMAL LOW (ref 0.61–1.24)
GFR, Estimated: >60 mL/min (ref >60)
Glucose, Bld: 107 mg/dL — ABNORMAL HIGH (ref 70–99)
Potassium: 3.4 mmol/L — ABNORMAL LOW (ref 3.5–5.1)
Sodium: 140 mmol/L (ref 135–145)

## 2024-01-26 MED ORDER — POTASSIUM CHLORIDE CRYS ER 20 MEQ PO TBCR
40.0000 meq | EXTENDED_RELEASE_TABLET | Freq: Once | ORAL | Status: AC
Start: 2024-01-26 — End: 2024-01-26
  Administered 2024-01-26: 40 meq via ORAL
  Filled 2024-01-26: qty 2

## 2024-01-26 NOTE — Progress Notes (Signed)
 Occupational Therapy Treatment Patient Details Name: GARO HEIDELBERG MRN: [id number] DOB: 09/10/1950 Today's Date: 01/26/2024   History of present illness LEOTHA VOELTZ is a 73 y.o. male with history of hypertension, chronic pain disorder, neuromuscular disorder, chronic left dropfoot, presenting with right knee pain after fall.  Patient states that he was trying to transfer into his wheelchair, the wheels were not locked, he fell and landed on his right knee.  States that he heard a pop, unsure if he felt it dislocate.   OT comments  Pt seen for OT treatment on this date. Upon arrival to room pt seated in recliner chair, agreeable to tx and requested to get back in bed. Pt requires Max A x 2 to transfer from chair to EOB with slide board due to surface height discrepancy with skilled cuing for BUE/LLE placement and use throughout.  Patient required Max A for bed mobility and LB dressing, toileting hygiene following transfer with modified technique in supine using bed rails.  Pt making progress toward goals, will continue to follow POC.       If plan is discharge home, recommend the following:  A lot of help with bathing/dressing/bathroom;Assistance with cooking/housework;Two people to help with walking and/or transfers;Help with stairs or ramp for entrance;Assist for transportation   Equipment Recommendations       Recommendations for Other Services      Precautions / Restrictions Precautions Precautions: Fall Recall of Precautions/Restrictions: Intact Precaution/Restrictions Comments: KI donned. NWB per ortho note.  Plan for ortho follow up outpatient, chronic BLE paraplegia/weakness, self cath, Required Braces or Orthoses: Knee Immobilizer - Right Restrictions Weight Bearing Restrictions Per Provider Order: Yes RLE Weight Bearing Per Provider Order: Non weight bearing Other Position/Activity Restrictions: NWB RLE       Mobility Bed Mobility Overal bed mobility: Needs  Assistance       Supine to sit: Min assist Sit to supine: Used rails, Min assist        Transfers Overall transfer level: Needs assistance Equipment used: Sliding board              Lateral/Scoot Transfers: Max assist, +2 physical assistance General transfer comment: Significant assistance for slide board transfer from chair to EOB due to height difference between surfaces.     Balance Overall balance assessment: Needs assistance Sitting-balance support: Feet supported, Bilateral upper extremity supported Sitting balance-Leahy Scale: Fair Sitting balance - Comments: sitting balance in chair with BUEs/LLE supported in preparation for slide board transfer to EOB Postural control: Posterior lean                                 ADL either performed or assessed with clinical judgement   ADL Overall ADL's : Needs assistance/impaired                     Lower Body Dressing: Maximal assistance                 General ADL Comments: supine, bed mobility rolling R/L for clothing management    Extremity/Trunk Assessment Upper Extremity Assessment Upper Extremity Assessment: Generalized weakness            Vision       Perception     Praxis     Communication     Cognition Arousal: Alert Behavior During Therapy: Flat affect, WFL for tasks assessed/performed  Cueing      Exercises Other Exercises Other Exercises: education on use of slide board along with BUE/LLE placement to support this transfer    Shoulder Instructions       General Comments      Pertinent Vitals/ Pain       Pain Assessment Pain Assessment: 0-10 Pain Score: 9  Pain Location: Rectum Pain Descriptors / Indicators: Discomfort, Grimacing Pain Intervention(s): Monitored during session, Repositioned, Patient requesting pain meds-RN notified (RN present at bedside during part and end of session)  Home  Living                                          Prior Functioning/Environment              Frequency  Min 2X/week        Progress Toward Goals  OT Goals(current goals can now be found in the care plan section)  Progress towards OT goals: Progressing toward goals     Plan      Co-evaluation                 AM-PAC OT 6 Clicks Daily Activity     Outcome Measure   Help from another person eating meals?: None Help from another person taking care of personal grooming?: None Help from another person toileting, which includes using toliet, bedpan, or urinal?: A Lot Help from another person bathing (including washing, rinsing, drying)?: A Lot Help from another person to put on and taking off regular upper body clothing?: A Little Help from another person to put on and taking off regular lower body clothing?: A Lot 6 Click Score: 17    End of Session Equipment Utilized During Treatment: Gait belt;Right knee immobilizer;Other (comment) (slide board)  OT Visit Diagnosis: Muscle weakness (generalized) (M62.81)   Activity Tolerance Patient tolerated treatment well   Patient Left in bed;with call bell/phone within reach;with bed alarm set;with nursing/sitter in room   Nurse Communication          Time: 8450-8368 OT Time Calculation (min): 42 min  Charges: OT General Charges $OT Visit: 1 Visit  Harlene Sharps OTR/L   Harlene LITTIE Sharps 01/26/2024, 5:00 PM

## 2024-01-26 NOTE — Progress Notes (Signed)
 Progress Note   Patient: [name] FMW:[id number] DOB: Jun 14, 1951 DOA: 01/20/2024     4 DOS: the patient was seen and examined on 01/26/2024     Brief hospital course: [name] is a 73 y.o. 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  Patient injured his right knee.    Patient was seen by orthopedics who is not recommending surgery at this time but will follow-up as an outpatient.     Assessment and Plan:    Right tibiofibular fracture: - Status post fall. - Reviewed x-ray and CT angio of right lower extremity. Case discussed with Ortho Dr. Patel-recommended nonoperative management since patient is paraplegic and wheelchair-bound.  Continue as needed pain medications. PT recommends skilled nursing facility Continue to monitor H&H TOC on board for placement need   Chronic pain syndrome Paraplegic: Lumbar spinal cord injury:  Continue gabapentin .   Followed by pain management.   Hypertension: Stable.  Continue losartan    Chronic constipation: Continue Colace and MiraLAX    Neurogenic bladder: Patient self caths-4-6 time per day   History of prostate cancer:aware     DVT prophylaxis: Lovenox    Code Status: Full code   Family Communication: None present at bedside.  Plan of care discussed with patient in length and he verbalized understanding and agreed with it.      Disposition Plan: SNF, pending insurance authorization, patient has accepted bed offer at [facility].   Subjective:  Patient seen and examined at bedside this morning Patient denies nausea vomiting abdominal pain chest pain cough According to [facility] they are working on placement and  authorization   Physical Exam:   Constitutional: NAD, calm, comfortable, on room air, communicating well Eyes: PERRL, lids and conjunctivae normal ENMT: Mucous membranes are moist. Posterior pharynx clear of any exudate or lesions.Normal dentition.  Neck: normal, supple, no masses, no thyromegaly Respiratory: clear to auscultation bilaterally, no wheezing, no crackles. Normal respiratory effort. No accessory muscle use.  Cardiovascular: Regular rate and rhythm, no murmurs / rubs / gallops. No extremity edema. 2+ pedal pulses. No carotid bruits.  Abdomen: no tenderness, no masses palpated. No hepatosplenomegaly. Bowel sounds positive.  Musculoskeletal: Right leg: In knee immobilizer Skin: no rashes, lesions, ulcers. No induration Neurologic: Alert and oriented x 3.  Following commands.  Paraplegic.     Data Reviewed:  Vitals:   01/26/24 0506 01/26/24 0507 01/26/24 0821 01/26/24 1129  BP: 118/73  (!) 120/99 112/60  Pulse: 76 78 78 71  Resp: 16  17 16   Temp:  97.9 F (36.6 C) 98.4 F (36.9 C) 98.1 F (36.7 C)  TempSrc:   Oral   SpO2: 97% 97% 100% 94%  Weight:      Height:          Latest Ref Rng & Units 01/26/2024    4:42 AM 01/25/2024    3:38 AM 01/24/2024    5:35 AM  CBC  WBC 4.0 - 10.5 K/uL 4.9  5.4  6.4   Hemoglobin 13.0 - 17.0 g/dL 88.2  87.8  87.3   Hematocrit 39.0 - 52.0 % 34.4  35.4  36.9   Platelets 150 - 400 K/uL 160  155  159        Latest Ref  Rng & Units 01/26/2024    4:42 AM 01/25/2024    3:38 AM 01/24/2024    5:35 AM  BMP  Glucose 70 - 99 mg/dL 892  893  890   BUN 8 - 23 mg/dL 17  18  20    Creatinine 0.61 - 1.24 mg/dL 9.43  9.37  9.22   Sodium 135 - 145 mmol/L 140  139  135   Potassium 3.5 - 5.1 mmol/L 3.4  3.5  3.5   Chloride 98 - 111 mmol/L 103  104  98   CO2 22 - 32 mmol/L 28  28  26    Calcium 8.9 - 10.3 mg/dL 8.6  8.7  8.6      Time spent: 39 minutes  Author: Drue ONEIDA Potter, MD 01/26/2024 1:49 PM  For on call review [email or web address].

## 2024-01-26 NOTE — Progress Notes (Signed)
 Physical Therapy Treatment Patient Details Name: [name] MRN: [id number] DOB: [birth year] Today's Date: 01/26/2024   History of Present Illness [name] is a 73 y.o. male with history of hypertension, chronic pain disorder, neuromuscular disorder, chronic left dropfoot, presenting with right knee pain after fall.  Patient states that he was trying to transfer into his wheelchair, the wheels were not locked, he fell and landed on his right knee.  States that he heard a pop, unsure if he felt it dislocate.    PT Comments  Pt was long sitting in bed upon arrival. He remains A and O x 4. Asking about DC planning. Awaiting bed offers to select STR placement. Pt was agreeable to OOB. Prior to OOB, author removed and adjusted knee immobilizer to proper fit. Edema noted with RLE warm (not hot) to touch. He demonstrates improving abilities to transfer safely OOB to recliner via slide board. Reviewed there ex and positioning to promote healing and return in strength. Author answered all of pt's questions prior to conclusion of session. He remains in recliner post session with all needs within reach and pt resting comfortably. Dc recs remain appropriate to maximize his independence and safety with all ADLs. Pt was I'ly transferring at baseline but currently still requires assistance that both pt/pt's spouse do not feel they can perform  at home currently. Acute PT will continue to follow per current POC.    If plan is discharge home, recommend the following: A lot of help with bathing/dressing/bathroom;Two people to help with walking and/or transfers;Help with stairs or ramp for entrance;Assistance with Systems developer (measurements PT);Wheelchair cushion (measurements PT);Hospital bed;Other (comment);BSC/3in1 (If pt is Dcing home)       Precautions / Restrictions Precautions Precautions: Fall Recall of Precautions/Restrictions:  Intact Precaution/Restrictions Comments: KI donned. NWB per ortho note.  Plan for ortho follow up outpatient, chronic BLE paraplegia/weakness, self cath, Required Braces or Orthoses: Knee Immobilizer - Right Restrictions Weight Bearing Restrictions Per Provider Order: Yes RLE Weight Bearing Per Provider Order: Non weight bearing     Mobility  Bed Mobility Overal bed mobility: Needs Assistance Bed Mobility: Supine to Sit  Supine to sit: HOB elevated, Used rails, Min assist  General bed mobility comments: pt requires use of BUEs to assist LEs to EOB/floor. increased time required however only minimal assistance required. pt does rely on bedrails and vcing for tchnique improvements. prior to OOB activity, author removed and replaced knee immobilizer to propt proper fit and positioning. encouraged pt to perform BLEs ther ex for both strengthening and circulation improvements    Transfers Overall transfer level: Needs assistance Equipment used: Sliding board Transfers: Bed to chair/wheelchair/BSC   Lateral/Scoot Transfers: Min assist, With slide board General transfer comment: pt requires less assistance to exit bed to recliner via slideboard. will need increased assistance when returning to bed from recliner due to elevated bed height versus recliner surface. Overall pt's pain much improved since admission.    Ambulation/Gait  General Gait Details: unable to at baseline    Balance Overall balance assessment: Needs assistance Sitting-balance support: Feet supported, Bilateral upper extremity supported Sitting balance-Leahy Scale: Good      Standing balance comment: currently unable. attempted stand previous date without success. Pt needs LLE to be stronger prior to next attempt at standing.       Communication Communication Communication: No apparent difficulties  Cognition Arousal: Alert Behavior During Therapy: Flat affect, WFL for tasks assessed/performed  PT - Cognitive  impairments: No apparent impairments    PT - Cognition Comments: Pt is A and O x4. He asked appropriate questions about DCing to STR Following commands: Intact      Cueing Cueing Techniques: Verbal cues, Tactile cues         Pertinent Vitals/Pain Pain Assessment Pain Assessment: 0-10 Pain Score: 3  Pain Location: R knee Pain Descriptors / Indicators: Discomfort, Guarding, Grimacing Pain Intervention(s): Limited activity within patient's tolerance, Monitored during session, Premedicated before session, Repositioned     PT Goals (current goals can now be found in the care plan section) Acute Rehab PT Goals Patient Stated Goal: be independent again Progress towards PT goals: Progressing toward goals    Frequency    Min 3X/week       AM-PAC PT 6 Clicks Mobility   Outcome Measure  Help needed turning from your back to your side while in a flat bed without using bedrails?: A Little Help needed moving from lying on your back to sitting on the side of a flat bed without using bedrails?: A Little Help needed moving to and from a bed to a chair (including a wheelchair)?: A Lot Help needed standing up from a chair using your arms (e.g., wheelchair or bedside chair)?: Total Help needed to walk in hospital room?: Total Help needed climbing 3-5 steps with a railing? : Total 6 Click Score: 11    End of Session Equipment Utilized During Treatment: Right knee immobilizer Activity Tolerance: Patient tolerated treatment well;Patient limited by fatigue Patient left: in chair;with call bell/phone within reach;with chair alarm set Nurse Communication: Mobility status PT Visit Diagnosis: Other abnormalities of gait and mobility (R26.89);Muscle weakness (generalized) (M62.81);Pain Pain - Right/Left: Right Pain - part of body: Knee     Time: 1040-1056 PT Time Calculation (min) (ACUTE ONLY): 16 min  Charges:    $Therapeutic Activity: 8-22 mins PT General Charges $$ ACUTE PT  VISIT: 1 Visit                     Rankin Essex PTA 01/26/24, 11:36 AM

## 2024-01-26 NOTE — TOC Progression Note (Signed)
 Transition of Care [facility]) - Progression Note    Patient Details  Name: URIJAH ARKO MRN: [id number] Date of Birth: 02/25/1951  Transition of Care [facility]) CM/SW Contact  [place] L Ev[place]aria [place], [place] Phone Number: 01/26/2024, 1:08 PM  Clinical Narrative:     CSW spoke with patient to review bed offers. Patient accepted offer at [facility]. Shara will be started.     Barriers to Discharge: Continued Medical Work up  Expected Discharge Plan and Services In-house Referral: Clinical Social Work     Living arrangements for the past 2 months: Single Family Home                   DME Agency: Other - Comment (pt currently receiving HH services from [facility])                   Social Determinants of Health (SDOH) Interventions SDOH Screenings   Food Insecurity: No Food Insecurity (01/22/2024)  Housing: Unknown (01/22/2024)  Transportation Needs: No Transportation Needs (01/22/2024)  Utilities: Not At Risk (01/22/2024)  Financial Resource Strain: Low Risk  (11/03/2023)   Received from [facility]  Physical Activity: Insufficiently Active (02/07/2020)   Received from [facility]  Social Connections: Unknown (01/22/2024)  Stress: No Stress Concern Present (02/07/2020)   Received from [facility]  Tobacco Use: Medium Risk (01/20/2024)  Health Literacy: Medium Risk (02/07/2020)   Received from [facility]    Readmission Risk Interventions     No data to display

## 2024-01-26 NOTE — Plan of Care (Signed)
  Problem: Clinical Measurements: Goal: Respiratory complications will improve Outcome: Progressing   Problem: Nutrition: Goal: Adequate nutrition will be maintained Outcome: Progressing   

## 2024-01-27 DIAGNOSIS — S82231A Displaced oblique fracture of shaft of right tibia, initial encounter for closed fracture: Secondary | ICD-10-CM | POA: Diagnosis not present

## 2024-01-27 LAB — URINALYSIS, COMPLETE (UACMP) WITH MICROSCOPIC
Bilirubin Urine: NEGATIVE
Glucose, UA: NEGATIVE mg/dL
Ketones, ur: NEGATIVE mg/dL
Nitrite: POSITIVE — AB
Protein, ur: NEGATIVE mg/dL
Specific Gravity, Urine: 1.02 (ref 1.005–1.030)
WBC, UA: >50 WBC/hpf (ref 0–5)
pH: 5 (ref 5.0–8.0)

## 2024-01-27 LAB — BASIC METABOLIC PANEL WITH GFR
Anion gap: 9 (ref 5–15)
BUN: 20 mg/dL (ref 8–23)
CO2: 25 mmol/L (ref 22–32)
Calcium: 8.6 mg/dL — ABNORMAL LOW (ref 8.9–10.3)
Chloride: 105 mmol/L (ref 98–111)
Creatinine, Ser: 0.69 mg/dL (ref 0.61–1.24)
GFR, Estimated: >60 mL/min (ref >60)
Glucose, Bld: 99 mg/dL (ref 70–99)
Potassium: 3.7 mmol/L (ref 3.5–5.1)
Sodium: 139 mmol/L (ref 135–145)

## 2024-01-27 LAB — CBC WITH DIFFERENTIAL/PLATELET
Abs Immature Granulocytes: 0.02 K/uL (ref 0.00–0.07)
Basophils Absolute: 0 K/uL (ref 0.0–0.1)
Basophils Relative: 1 %
Eosinophils Absolute: 0.3 K/uL (ref 0.0–0.5)
Eosinophils Relative: 5 %
HCT: 35 % — ABNORMAL LOW (ref 39.0–52.0)
Hemoglobin: 11.7 g/dL — ABNORMAL LOW (ref 13.0–17.0)
Immature Granulocytes: 0 %
Lymphocytes Relative: 26 %
Lymphs Abs: 1.4 K/uL (ref 0.7–4.0)
MCH: 29.4 pg (ref 26.0–34.0)
MCHC: 33.4 g/dL (ref 30.0–36.0)
MCV: 87.9 fL (ref 80.0–100.0)
Monocytes Absolute: 0.7 K/uL (ref 0.1–1.0)
Monocytes Relative: 13 %
Neutro Abs: 2.9 K/uL (ref 1.7–7.7)
Neutrophils Relative %: 55 %
Platelets: 172 K/uL (ref 150–400)
RBC: 3.98 MIL/uL — ABNORMAL LOW (ref 4.22–5.81)
RDW: 12.3 % (ref 11.5–15.5)
WBC: 5.3 K/uL (ref 4.0–10.5)
nRBC: 0 % (ref 0.0–0.2)

## 2024-01-27 MED ORDER — CIPROFLOXACIN HCL 500 MG PO TABS: 500.0000 mg | ORAL_TABLET | Freq: Two times a day (BID) | ORAL | Status: DC

## 2024-01-27 MED ORDER — OXYCODONE HCL 5 MG PO TABS: 5.0000 mg | ORAL_TABLET | Freq: Three times a day (TID) | ORAL | 0 refills | Status: AC | PRN

## 2024-01-27 MED ORDER — CIPROFLOXACIN HCL 500 MG PO TABS
500.0000 mg | ORAL_TABLET | Freq: Two times a day (BID) | ORAL | Status: DC
  Administered 2024-01-27: 500 mg via ORAL
  Filled 2024-01-27 (×2): qty 1

## 2024-01-27 MED ORDER — LOPERAMIDE HCL 2 MG PO CAPS
4.0000 mg | ORAL_CAPSULE | ORAL | Status: DC | PRN
  Administered 2024-01-27: 4 mg via ORAL
  Filled 2024-01-27: qty 2

## 2024-01-27 MED ORDER — CEFADROXIL 500 MG PO CAPS: 1000.0000 mg | ORAL_CAPSULE | Freq: Two times a day (BID) | ORAL | Status: DC

## 2024-01-27 MED ORDER — LOPERAMIDE HCL 2 MG PO CAPS: 4.0000 mg | ORAL_CAPSULE | ORAL | Status: AC | PRN

## 2024-01-27 MED ORDER — SODIUM CHLORIDE 0.9 % IV SOLN
1.0000 g | Freq: Once | INTRAVENOUS | Status: DC
  Filled 2024-01-27: qty 10

## 2024-01-27 MED ORDER — CEFADROXIL 500 MG PO CAPS: 1000.0000 mg | ORAL_CAPSULE | Freq: Two times a day (BID) | ORAL | Status: AC

## 2024-01-27 NOTE — Hospital Course (Signed)
 Hospital course / significant events:   HPI: [name] is a 73 y.o. 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  Patient injured his right knee.      07/02: to ED. R tibiofibular fracture.  07/03: EDP spoke w/ orthopedic team - recs for knee immobilizer and f/u outpatient in 1 week, no surgery needed. PT/OT recs for SNF 07/04: admitted to hospitalist while awaiting placement  07/05-07/08: awaiting placement.      Consultants:  ***  Procedures/Surgeries: ***      ASSESSMENT & PLAN:   Right tibiofibular fracture s/p mechanical fall  Per Ortho Dr. Patel-recommended nonoperative management since patient is paraplegic and wheelchair-bound.   Pain control NWB SNF rehab    Chronic pain syndrome Paraplegic: Lumbar spinal cord injury:  Continue gabapentin .   Followed by pain management.   Hypertension Stable.   Continue losartan    Chronic constipation continue Colace and MiraLAX    Neurogenic bladder: Patient self caths-4-6 time per day   History of prostate cancer: No concerns at this time       *** based on BMI: Body mass index is 27.71 kg/m.SABRA Significantly low or high BMI is associated with higher medical risk.  Underweight - under 18  overweight - 25 to 29 obese - 30 or more Class 1 obesity: BMI of 30.0 to 34 Class 2 obesity: BMI of 35.0 to 39 Class 3 obesity: BMI of 40.0 to 49 Super Morbid Obesity: BMI 50-59 Super-super Morbid Obesity: BMI 60+ Healthy nutrition and physical activity advised as adjunct to other disease management and risk reduction treatments    DVT prophylaxis: *** IV fluids: *** continuous IV fluids  Nutrition: *** Central lines /  other devices: ***  Code Status: *** ACP documentation reviewed: *** none on file in [facility]  TOC needs: *** Medical barriers to dispo: ***. Expected medical readiness for discharge ***.

## 2024-01-27 NOTE — Progress Notes (Signed)
 Spoke with Antria at [facility] and gave report.

## 2024-01-27 NOTE — Discharge Summary (Addendum)
 Physician Discharge Summary   Patient: [name] MRN: [id number]  DOB: 10/25/1950   Admit:     Date of Admission: 01/20/2024 Admitted from: home   Discharge: Date of discharge: 01/27/24 Disposition: Skilled nursing facility Condition at discharge: good  CODE STATUS: FULL CODE     Discharge Physician: Laneta Blunt, DO [facility]     PCP: Sadie Manna, MD  Recommendations for Outpatient Follow-up:  Follow up with PCP Sadie Manna, MD in 1-2 weeks Follow up on pending urine culture collected 01/27/24  Please obtain labs/tests: consider repeat CBC, BMP, UA in 1-2 weeks     Discharge Instructions     Ambulatory referral to Cardiology   Complete by: As directed    If you have not heard from the Cardiology office within the next 72 hours please call [phone].   Diet general   Complete by: As directed    Discharge instructions   Complete by: As directed    PT/OT per protocol Pt may self-cath ad-lib Provider needs to follow up on urine cultures over next 1-2 days and adjust antibiotics as needed   Increase activity slowly   Complete by: As directed          Discharge Diagnoses: Principal Problem:   Right tibial fracture        Hospital course / significant events:   HPI: [name] is a 73 y.o. 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  Patient injured his right knee.      07/02: to ED. R tibiofibular fracture.  07/03: EDP spoke w/ orthopedic team - recs for knee immobilizer and f/u outpatient in 1 week, no surgery needed. PT/OT recs for SNF 07/04: admitted to hospitalist while awaiting placement  07/05-07/08:  awaiting placement.  07/09: pt c/o increased need for self-cath past 3-4 days, UA (+)WBC and nitrite concerning for UTI, no sepsis, started abx po and SNF will need to f/u on UCx results / adjust abx pnrn      Consultants:  Orthopedics   Procedures/Surgeries: none      ASSESSMENT & PLAN:   Right tibiofibular fracture s/p mechanical fall  Per Ortho Dr. Earnestine Blanch - recommended nonoperative management and outpatient follow up  Pain control NWB SNF rehab    Chronic pain syndrome Paraplegic: Lumbar spinal cord injury:  Continue gabapentin .   Followed by pain management.   UTI Question possible colonization, symptoms difficult to assess w/ neuro deficits but he does report increased frequency/urgency  cefadroxil  started 01/27/24 Follow UCx Pt has requested indwelling urinary catheter, advise agains tthis / follow w/ urology outpatient   Hypertension Stable.   Continue losartan    Chronic constipation Ok for Colace and MiraLAX  prn   Neurogenic bladder: Patient self caths-4-6 time per day   History of prostate cancer: No concerns at this time       overweight based on BMI: Body mass index is 27.71 kg/m.SABRA Significantly low or high BMI is associated with higher medical risk.  Underweight - under 18  overweight - 25 to 29 obese - 30 or more Class 1 obesity: BMI of 30.0 to 34 Class 2 obesity: BMI of 35.0 to 39 Class 3 obesity: BMI of 40.0 to 49 Super  Morbid Obesity: BMI 50-59 Super-super Morbid Obesity: BMI 60+ Healthy nutrition and physical activity advised as adjunct to other disease management and risk reduction treatments           Discharge Instructions  Allergies as of 01/27/2024       Reactions   Clindamycin Hives   Doxycycline  Rash   Penicillins Rash   Sulfamethoxazole Rash        Medication List     STOP taking these medications    acetaminophen  500 MG tablet Commonly known as: TYLENOL    ciprofloxacin  500 MG tablet Commonly  known as: CIPRO        TAKE these medications    ascorbic acid  500 MG tablet Commonly known as: VITAMIN C  Take 500 mg by mouth Once Daily.   cefadroxil  500 MG capsule Commonly known as: DURICEF Take 2 capsules (1,000 mg total) by mouth 2 (two) times daily for 13 doses.   docusate sodium  50 MG capsule Commonly known as: COLACE Take 100 mg by mouth 2 (two) times daily.   FISH OIL PO Take 1 tablet by mouth 3 (three) times a week.   gabapentin  800 MG tablet Commonly known as: NEURONTIN  Take 1 tablet (800 mg total) by mouth 3 (three) times daily. What changed: when to take this   loperamide  2 MG capsule Commonly known as: IMODIUM  Take 2 capsules (4 mg total) by mouth as needed for diarrhea or loose stools.   losartan  100 MG tablet Commonly known as: COZAAR  Take 100 mg by mouth daily with lunch.   methenamine  1 g tablet Commonly known as: HIPREX  Take 1 g by mouth 3 (three) times daily.   multivitamin tablet Take 1 tablet by mouth 3 (three) times a week.   One Daily Calcium/Iron Tabs Take 1 tablet by mouth.   oxyCODONE  5 MG immediate release tablet Commonly known as: Roxicodone  Take 1 tablet (5 mg total) by mouth every 8 (eight) hours as needed for up to 12 doses for severe pain (pain score 7-10) or breakthrough pain.   PAIN MANAGEMENT INTRATHECAL (IT) PUMP 1 each by Intrathecal route continuous. Intrathecal (IT) medication:  Baclofen , Dilaudid    polyethylene glycol powder 17 GM/SCOOP powder Commonly known as: GLYCOLAX /MIRALAX  Take 17 g by mouth as needed.   Propylene Glycol 0.6 % Soln Place 1 drop into both eyes daily as needed (dry eyes).   Trospium Chloride 60 MG Cp24 Take 60 mg by mouth daily.   VITAMIN B 12 PO Take 1 tablet by mouth 3 (three) times a week.         Contact information for follow-up providers     Tobie Priest, MD. Call today.   Specialty: Orthopedic Surgery Why: To follow up in 1 week with orthopedic surgery Contact  information: [address] [place] [address] [phone]              Contact information for after-discharge care     Destination     [facility] .   Service: Skilled Nursing Contact information: [address] [place]  [address] [phone]                     Allergies  Allergen Reactions   Clindamycin Hives   Doxycycline  Rash   Penicillins Rash   Sulfamethoxazole Rash     Subjective: pt reports needing to self-cath more often, feels it is leaking, per RN this may be d/t different cath supplies here  in hospital than patient is sued to but either way no fever/chills, no suprapubic pain, no hematuria, no dizziness/weakness. Otherwise feeling well, pain controlled, tolerating diet   Discharge Exam: BP (!) 105/58   Pulse 92   Temp 99.2 F (37.3 C)   Resp 17   Ht 6' 1 (1.854 m)   Wt 95.3 kg   SpO2 97%   BMI 27.71 kg/m  General: Pt is alert, awake, not in acute distress Cardiovascular: RRR, S1/S2 +, no rubs, no gallops Respiratory: CTA bilaterally, no wheezing, no rhonchi Abdominal: Soft, NT, ND, bowel sounds + Extremities: no edema, no cyanosis     The results of significant diagnostics from this hospitalization (including imaging, microbiology, ancillary and laboratory) are listed below for reference.     Microbiology: No results found for this or any previous visit (from the past 240 hours).   Labs: BNP (last 3 results) No results for input(s): BNP in the last 8760 hours. Basic Metabolic Panel: Recent Labs  Lab 01/22/24 1741 01/24/24 0535 01/25/24 0338 01/26/24 0442 01/27/24 0334  NA 138 135 139 140 139  K 4.2 3.5 3.5 3.4* 3.7  CL 100 98 104 103 105  CO2 28 26 28 28 25   GLUCOSE 114* 109* 106* 107* 99  BUN 19 20 18 17 20   CREATININE 0.52* 0.77 0.62 0.56* 0.69  CALCIUM 8.8* 8.6* 8.7* 8.6* 8.6*   Liver Function Tests: Recent Labs   Lab 01/20/24 2311  AST 25  ALT 25  ALKPHOS 73  BILITOT 0.3  PROT 7.0  ALBUMIN 4.0   No results for input(s): LIPASE, AMYLASE in the last 168 hours. No results for input(s): AMMONIA in the last 168 hours. CBC: Recent Labs  Lab 01/20/24 2311 01/22/24 1741 01/23/24 0534 01/24/24 0535 01/25/24 0338 01/26/24 0442 01/27/24 0334  WBC 7.1   < > 6.8 6.4 5.4 4.9 5.3  NEUTROABS 4.5  --   --  3.6 2.9 2.6 2.9  HGB 14.5   < > 13.2 12.6* 12.1* 11.7* 11.7*  HCT 43.0   < > 39.1 36.9* 35.4* 34.4* 35.0*  MCV 87.9   < > 86.9 87.0 87.4 88.2 87.9  PLT 180   < > 149* 159 155 160 172   < > = values in this interval not displayed.   Cardiac Enzymes: No results for input(s): CKTOTAL, CKMB, CKMBINDEX, TROPONINI in the last 168 hours. BNP: Invalid input(s): POCBNP CBG: No results for input(s): GLUCAP in the last 168 hours. D-Dimer No results for input(s): DDIMER in the last 72 hours. Hgb A1c No results for input(s): HGBA1C in the last 72 hours. Lipid Profile No results for input(s): CHOL, HDL, LDLCALC, TRIG, CHOLHDL, LDLDIRECT in the last 72 hours. Thyroid function studies No results for input(s): TSH, T4TOTAL, T3FREE, THYROIDAB in the last 72 hours.  Invalid input(s): FREET3 Anemia work up No results for input(s): VITAMINB12, FOLATE, FERRITIN, TIBC, IRON, RETICCTPCT in the last 72 hours. Urinalysis    Component Value Date/Time   COLORURINE YELLOW (A) 01/27/2024 1045   APPEARANCEUR CLOUDY (A) 01/27/2024 1045   LABSPEC 1.020 01/27/2024 1045   PHURINE 5.0 01/27/2024 1045   GLUCOSEU NEGATIVE 01/27/2024 1045   HGBUR SMALL (A) 01/27/2024 1045   BILIRUBINUR NEGATIVE 01/27/2024 1045   KETONESUR NEGATIVE 01/27/2024 1045   PROTEINUR NEGATIVE 01/27/2024 1045   NITRITE POSITIVE (A) 01/27/2024 1045   LEUKOCYTESUR LARGE (A) 01/27/2024 1045   Sepsis Labs Recent Labs  Lab 01/24/24 0535 01/25/24 0338 01/26/24 0442 01/27/24 0334  WBC  6.4 5.4 4.9 5.3   Microbiology No results found for this or any previous visit (from the past 240 hours). Imaging CT ANGIO LOWER EXT BILAT W &/OR WO CONTRAST Result Date: 01/21/2024 CLINICAL DATA:  Knee trauma, dislocation suspected, EXAM: CT ANGIOGRAPHY OF THE RIGHT/LEFT UPPER/LOWEREXTREMITY TECHNIQUE: Multidetector CT imaging of the right/left upper/lowerwas performed using the standard protocol during bolus administration of intravenous contrast. Multiplanar CT image reconstructions and MIPs were obtained to evaluate the vascular anatomy. RADIATION DOSE REDUCTION: This exam was performed according to the departmental dose-optimization program which includes automated exposure control, adjustment of the mA and/or kV according to patient size and/or use of iterative reconstruction technique. CONTRAST:  OMNIPAQUE  IOHEXOL  350 MG/ML SOLN COMPARISON:  None Available. FINDINGS: VASCULAR Aorta: Calcified atherosclerotic plaque.  No aneurysm or dissection. RIGHT Lower Extremity Inflow: Calcified atherosclerotic plaque. No aneurysm or dissection. Outflow: Patent without aneurysm or dissection. Runoff: Patent three vessel runoff to the ankle. LEFT Lower Extremity Inflow: Patent without aneurysm or dissection. Outflow: Patent without aneurysm or dissection. Runoff: Patent three vessel runoff to the ankle. Veins: No acute abnormality on arterial phase study. Review of the MIP images confirms the above findings. NON-VASCULAR Large right lipohemarthrosis in the knee. Demineralization. Redemonstrated oblique fracture in the proximal right tibial metadiaphysis. Given large lipohemarthrosis this likely extends into the articular surface of the lateral tibial plateau however the exam is not optimized for fracture evaluation. Additional nondisplaced fracture of the fibular head. No dislocation. IMPRESSION: 1. No evidence of acute vascular injury. 2. Redemonstrated oblique fracture in the proximal right tibial  metadiaphysis. Given large lipohemarthrosis this likely extends into the articular surface of the lateral tibial plateau however the exam is not optimized for fracture evaluation. 3. Additional nondisplaced fracture of the right fibular head. 4. Aortic Atherosclerosis (ICD10-I70.0). Electronically Signed   By: Norman Gatlin M.D.   On: 01/21/2024 01:22   DG Knee Complete 4 Views Right Result Date: 01/21/2024 CLINICAL DATA:  Knee pain and swelling. Fell at home transferring from toilet to wheelchair. EXAM: RIGHT KNEE - COMPLETE 4+ VIEW COMPARISON:  None Available. FINDINGS: Acute mildly displaced oblique fracture in the proximal right tibial metaphysis. Question extension of the fracture into the lateral tibial plateau. Demineralization limits assessment. Moderate knee joint effusion. IMPRESSION: Acute mildly displaced oblique fracture in the proximal right tibial metaphysis. Question extension of the fracture into the lateral tibial plateau. Moderate knee joint effusion. Electronically Signed   By: Norman Gatlin M.D.   On: 01/21/2024 00:28      Time coordinating discharge: over 30 minutes  SIGNED:  Kendan Cornforth DO [facility]

## 2024-01-27 NOTE — TOC Transition Note (Signed)
 Transition of Care [facility]) - Discharge Note   Patient Details  Name: [name] MRN: [id number] Date of Birth: 1951/02/19  [facility]) CM/SW Contact:  [place] L [place] Mokry, LCSW Phone Number: 01/27/2024, 12:43 PM   Clinical Narrative:     CSW contacted spouse to advise of transport to [facility]. Transportation scheduled with [facility].   No ongoing TOC needs. TOC signing off.     Barriers to Discharge: Continued Medical Work up   Patient Goals and CMS Choice            Discharge Placement                       Discharge Plan and Services Additional resources added to the After Visit Summary for   In-house Referral: Clinical Social Work                DME Agency: Other - Comment (pt currently receiving HH services from [facility])                  Social Drivers of Health (SDOH) Interventions SDOH Screenings   Food Insecurity: No Food Insecurity (01/22/2024)  Housing: Unknown (01/22/2024)  Transportation Needs: No Transportation Needs (01/22/2024)  Utilities: Not At Risk (01/22/2024)  Financial Resource Strain: Low Risk  (11/03/2023)   [facility]  Physical Activity: Insufficiently Active (02/07/2020)   Received from [facility]  Social Connections: Unknown (01/22/2024)  Stress: No Stress Concern Present (02/07/2020)   Received from [facility]  Tobacco Use: Medium Risk (01/20/2024)  Health Literacy: Medium Risk (02/07/2020)   Received from [facility]     Readmission Risk Interventions     No data to display

## 2024-01-29 LAB — URINE CULTURE: Culture: >=100000 — AB

## 2024-09-26 ENCOUNTER — Ambulatory Visit

## 2024-09-28 ENCOUNTER — Ambulatory Visit

## 2024-10-03 ENCOUNTER — Ambulatory Visit

## 2024-10-05 ENCOUNTER — Ambulatory Visit

## 2024-10-10 ENCOUNTER — Ambulatory Visit

## 2024-10-12 ENCOUNTER — Ambulatory Visit

## 2024-10-17 ENCOUNTER — Ambulatory Visit

## 2024-10-19 ENCOUNTER — Ambulatory Visit

## 2024-10-24 ENCOUNTER — Ambulatory Visit

## 2024-10-26 ENCOUNTER — Ambulatory Visit

## 2024-10-31 ENCOUNTER — Ambulatory Visit

## 2024-11-02 ENCOUNTER — Ambulatory Visit

## 2024-11-07 ENCOUNTER — Ambulatory Visit

## 2024-11-09 ENCOUNTER — Ambulatory Visit

## 2024-11-14 ENCOUNTER — Ambulatory Visit

## 2024-11-16 ENCOUNTER — Ambulatory Visit

## 2024-11-21 ENCOUNTER — Ambulatory Visit

## 2024-11-23 ENCOUNTER — Ambulatory Visit

## 2024-11-28 ENCOUNTER — Ambulatory Visit
